# Patient Record
Sex: Female | Born: 1940
Health system: Southern US, Community
[De-identification: ages and names within clinical notes are randomized; demographics above are authoritative.]

## PROBLEM LIST (undated history)

## (undated) DIAGNOSIS — J45909 Unspecified asthma, uncomplicated: Secondary | ICD-10-CM

## (undated) DIAGNOSIS — K631 Perforation of intestine (nontraumatic): Secondary | ICD-10-CM

## (undated) DIAGNOSIS — M5136 Other intervertebral disc degeneration, lumbar region: Secondary | ICD-10-CM

## (undated) DIAGNOSIS — K449 Diaphragmatic hernia without obstruction or gangrene: Secondary | ICD-10-CM

## (undated) DIAGNOSIS — C3490 Malignant neoplasm of unspecified part of unspecified bronchus or lung: Secondary | ICD-10-CM

## (undated) DIAGNOSIS — M7918 Myalgia, other site: Secondary | ICD-10-CM

## (undated) DIAGNOSIS — R7881 Bacteremia: Secondary | ICD-10-CM

## (undated) DIAGNOSIS — E538 Deficiency of other specified B group vitamins: Secondary | ICD-10-CM

## (undated) DIAGNOSIS — IMO0002 Reserved for concepts with insufficient information to code with codable children: Secondary | ICD-10-CM

## (undated) DIAGNOSIS — R0600 Dyspnea, unspecified: Secondary | ICD-10-CM

## (undated) DIAGNOSIS — H3530 Unspecified macular degeneration: Secondary | ICD-10-CM

## (undated) DIAGNOSIS — I639 Cerebral infarction, unspecified: Secondary | ICD-10-CM

## (undated) DIAGNOSIS — M1990 Unspecified osteoarthritis, unspecified site: Secondary | ICD-10-CM

## (undated) DIAGNOSIS — Z9981 Dependence on supplemental oxygen: Secondary | ICD-10-CM

## (undated) DIAGNOSIS — K219 Gastro-esophageal reflux disease without esophagitis: Secondary | ICD-10-CM

## (undated) DIAGNOSIS — F32A Depression, unspecified: Secondary | ICD-10-CM

## (undated) DIAGNOSIS — A419 Sepsis, unspecified organism: Secondary | ICD-10-CM

## (undated) DIAGNOSIS — D649 Anemia, unspecified: Secondary | ICD-10-CM

## (undated) DIAGNOSIS — J189 Pneumonia, unspecified organism: Secondary | ICD-10-CM

## (undated) DIAGNOSIS — R32 Unspecified urinary incontinence: Secondary | ICD-10-CM

## (undated) DIAGNOSIS — R509 Fever, unspecified: Secondary | ICD-10-CM

## (undated) DIAGNOSIS — E785 Hyperlipidemia, unspecified: Secondary | ICD-10-CM

## (undated) DIAGNOSIS — C801 Malignant (primary) neoplasm, unspecified: Secondary | ICD-10-CM

## (undated) DIAGNOSIS — S129XXA Fracture of neck, unspecified, initial encounter: Secondary | ICD-10-CM

## (undated) DIAGNOSIS — M51369 Other intervertebral disc degeneration, lumbar region without mention of lumbar back pain or lower extremity pain: Secondary | ICD-10-CM

## (undated) DIAGNOSIS — I129 Hypertensive chronic kidney disease with stage 1 through stage 4 chronic kidney disease, or unspecified chronic kidney disease: Secondary | ICD-10-CM

## (undated) DIAGNOSIS — I2699 Other pulmonary embolism without acute cor pulmonale: Secondary | ICD-10-CM

## (undated) DIAGNOSIS — M25539 Pain in unspecified wrist: Secondary | ICD-10-CM

## (undated) DIAGNOSIS — F329 Major depressive disorder, single episode, unspecified: Secondary | ICD-10-CM

## (undated) DIAGNOSIS — G8918 Other acute postprocedural pain: Secondary | ICD-10-CM

## (undated) DIAGNOSIS — R079 Chest pain, unspecified: Secondary | ICD-10-CM

## (undated) DIAGNOSIS — J449 Chronic obstructive pulmonary disease, unspecified: Secondary | ICD-10-CM

## (undated) DIAGNOSIS — I10 Essential (primary) hypertension: Secondary | ICD-10-CM

## (undated) DIAGNOSIS — H5440 Blindness, one eye, unspecified eye: Secondary | ICD-10-CM

## (undated) DIAGNOSIS — M810 Age-related osteoporosis without current pathological fracture: Secondary | ICD-10-CM

## (undated) DIAGNOSIS — H269 Unspecified cataract: Secondary | ICD-10-CM

## (undated) HISTORY — PX: JOINT REPLACEMENT: SHX530

## (undated) HISTORY — PX: APPENDECTOMY: SHX54

## (undated) HISTORY — PX: KNEE SURGERY: SHX244

## (undated) HISTORY — PX: OSTOMY: SHX5997

## (undated) HISTORY — DX: Fever, unspecified: R50.9

## (undated) HISTORY — DX: Age-related osteoporosis without current pathological fracture: M81.0

## (undated) HISTORY — PX: CATARACT EXTRACTION W/ INTRAOCULAR LENS  IMPLANT, BILATERAL: SHX1307

## (undated) HISTORY — PX: RECTOCELE REPAIR: SHX761

## (undated) HISTORY — PX: TOE SURGERY: SHX1073

## (undated) HISTORY — PX: CHOLECYSTECTOMY: SHX55

## (undated) HISTORY — DX: Malignant neoplasm of unspecified part of unspecified bronchus or lung: C34.90

## (undated) HISTORY — DX: Other intervertebral disc degeneration, lumbar region without mention of lumbar back pain or lower extremity pain: M51.369

## (undated) HISTORY — DX: Pain in unspecified wrist: M25.539

## (undated) HISTORY — DX: Unspecified osteoarthritis, unspecified site: M19.90

## (undated) HISTORY — DX: Other acute postprocedural pain: G89.18

## (undated) HISTORY — DX: Unspecified cataract: H26.9

## (undated) HISTORY — DX: Reserved for concepts with insufficient information to code with codable children: IMO0002

## (undated) HISTORY — DX: Other intervertebral disc degeneration, lumbar region: M51.36

## (undated) HISTORY — DX: Chest pain, unspecified: R07.9

## (undated) HISTORY — DX: Deficiency of other specified B group vitamins: E53.8

## (undated) HISTORY — DX: Sepsis, unspecified organism: A41.9

## (undated) HISTORY — PX: COLOSTOMY REVERSAL: SHX5782

## (undated) HISTORY — DX: Unspecified urinary incontinence: R32

## (undated) HISTORY — DX: Perforation of intestine (nontraumatic): K63.1

## (undated) HISTORY — PX: ABDOMINAL SURGERY: SHX537

## (undated) HISTORY — DX: Unspecified asthma, uncomplicated: J45.909

## (undated) HISTORY — DX: Myalgia, other site: M79.18

## (undated) HISTORY — PX: BLADDER SURGERY: SHX569

## (undated) HISTORY — DX: Hypertensive chronic kidney disease with stage 1 through stage 4 chronic kidney disease, or unspecified chronic kidney disease: I12.9

## (undated) HISTORY — PX: COLON SURGERY: SHX602

## (undated) HISTORY — DX: Bacteremia: R78.81

## (undated) HISTORY — PX: ABDOMINAL HYSTERECTOMY: SHX81

## (undated) NOTE — *Deleted (*Deleted)
Paclitaxel injection What is this medicine? PACLITAXEL (PAK li TAX el) is a chemotherapy drug. It targets fast dividing cells, like cancer cells, and causes these cells to die. This medicine is used to treat ovarian cancer, breast cancer, lung cancer, Kaposi's sarcoma, and other cancers. This medicine may be used for other purposes; ask your health care Edi Gorniak or pharmacist if you have questions. COMMON BRAND NAME(S): Onxol, Taxol What should I tell my health care Cadyn Rodger before I take this medicine? They need to know if you have any of these conditions:  history of irregular heartbeat  liver disease  low blood counts, like low white cell, platelet, or red cell counts  lung or breathing disease, like asthma  tingling of the fingers or toes, or other nerve disorder  an unusual or allergic reaction to paclitaxel, alcohol, polyoxyethylated castor oil, other chemotherapy, other medicines, foods, dyes, or preservatives  pregnant or trying to get pregnant  breast-feeding How should I use this medicine? This drug is given as an infusion into a vein. It is administered in a hospital or clinic by a specially trained health care professional. Talk to your pediatrician regarding the use of this medicine in children. Special care may be needed. Overdosage: If you think you have taken too much of this medicine contact a poison control center or emergency room at once. NOTE: This medicine is only for you. Do not share this medicine with others. What if I miss a dose? It is important not to miss your dose. Call your doctor or health care professional if you are unable to keep an appointment. What may interact with this medicine? Do not take this medicine with any of the following medications:  disulfiram  metronidazole This medicine may also interact with the following medications:  antiviral medicines for hepatitis, HIV or AIDS  certain antibiotics like erythromycin and  clarithromycin  certain medicines for fungal infections like ketoconazole and itraconazole  certain medicines for seizures like carbamazepine, phenobarbital, phenytoin  gemfibrozil  nefazodone  rifampin  St. John's wort This list may not describe all possible interactions. Give your health care Zsofia Prout a list of all the medicines, herbs, non-prescription drugs, or dietary supplements you use. Also tell them if you smoke, drink alcohol, or use illegal drugs. Some items may interact with your medicine. What should I watch for while using this medicine? Your condition will be monitored carefully while you are receiving this medicine. You will need important blood work done while you are taking this medicine. This medicine can cause serious allergic reactions. To reduce your risk you will need to take other medicine(s) before treatment with this medicine. If you experience allergic reactions like skin rash, itching or hives, swelling of the face, lips, or tongue, tell your doctor or health care professional right away. In some cases, you may be given additional medicines to help with side effects. Follow all directions for their use. This drug may make you feel generally unwell. This is not uncommon, as chemotherapy can affect healthy cells as well as cancer cells. Report any side effects. Continue your course of treatment even though you feel ill unless your doctor tells you to stop. Call your doctor or health care professional for advice if you get a fever, chills or sore throat, or other symptoms of a cold or flu. Do not treat yourself. This drug decreases your body's ability to fight infections. Try to avoid being around people who are sick. This medicine may increase your risk to bruise   or bleed. Call your doctor or health care professional if you notice any unusual bleeding. Be careful brushing and flossing your teeth or using a toothpick because you may get an infection or bleed more easily.  If you have any dental work done, tell your dentist you are receiving this medicine. Avoid taking products that contain aspirin, acetaminophen, ibuprofen, naproxen, or ketoprofen unless instructed by your doctor. These medicines may hide a fever. Do not become pregnant while taking this medicine. Women should inform their doctor if they wish to become pregnant or think they might be pregnant. There is a potential for serious side effects to an unborn child. Talk to your health care professional or pharmacist for more information. Do not breast-feed an infant while taking this medicine. Men are advised not to father a child while receiving this medicine. This product may contain alcohol. Ask your pharmacist or healthcare Deja Pisarski if this medicine contains alcohol. Be sure to tell all healthcare providers you are taking this medicine. Certain medicines, like metronidazole and disulfiram, can cause an unpleasant reaction when taken with alcohol. The reaction includes flushing, headache, nausea, vomiting, sweating, and increased thirst. The reaction can last from 30 minutes to several hours. What side effects may I notice from receiving this medicine? Side effects that you should report to your doctor or health care professional as soon as possible:  allergic reactions like skin rash, itching or hives, swelling of the face, lips, or tongue  breathing problems  changes in vision  fast, irregular heartbeat  high or low blood pressure  mouth sores  pain, tingling, numbness in the hands or feet  signs of decreased platelets or bleeding - bruising, pinpoint red spots on the skin, black, tarry stools, blood in the urine  signs of decreased red blood cells - unusually weak or tired, feeling faint or lightheaded, falls  signs of infection - fever or chills, cough, sore throat, pain or difficulty passing urine  signs and symptoms of liver injury like dark yellow or brown urine; general ill feeling or  flu-like symptoms; light-colored stools; loss of appetite; nausea; right upper belly pain; unusually weak or tired; yellowing of the eyes or skin  swelling of the ankles, feet, hands  unusually slow heartbeat Side effects that usually do not require medical attention (report to your doctor or health care professional if they continue or are bothersome):  diarrhea  hair loss  loss of appetite  muscle or joint pain  nausea, vomiting  pain, redness, or irritation at site where injected  tiredness This list may not describe all possible side effects. Call your doctor for medical advice about side effects. You may report side effects to FDA at 1-800-FDA-1088. Where should I keep my medicine? This drug is given in a hospital or clinic and will not be stored at home. NOTE: This sheet is a summary. It may not cover all possible information. If you have questions about this medicine, talk to your doctor, pharmacist, or health care Jayson Waterhouse.  2020 Elsevier/Gold Standard (2017-01-14 13:14:55) Carboplatin injection What is this medicine? CARBOPLATIN (KAR boe pla tin) is a chemotherapy drug. It targets fast dividing cells, like cancer cells, and causes these cells to die. This medicine is used to treat ovarian cancer and many other cancers. This medicine may be used for other purposes; ask your health care Tighe Gitto or pharmacist if you have questions. COMMON BRAND NAME(S): Paraplatin What should I tell my health care Stellah Donovan before I take this medicine? They need to   know if you have any of these conditions:  blood disorders  hearing problems  kidney disease  recent or ongoing radiation therapy  an unusual or allergic reaction to carboplatin, cisplatin, other chemotherapy, other medicines, foods, dyes, or preservatives  pregnant or trying to get pregnant  breast-feeding How should I use this medicine? This drug is usually given as an infusion into a vein. It is administered in a  hospital or clinic by a specially trained health care professional. Talk to your pediatrician regarding the use of this medicine in children. Special care may be needed. Overdosage: If you think you have taken too much of this medicine contact a poison control center or emergency room at once. NOTE: This medicine is only for you. Do not share this medicine with others. What if I miss a dose? It is important not to miss a dose. Call your doctor or health care professional if you are unable to keep an appointment. What may interact with this medicine?  medicines for seizures  medicines to increase blood counts like filgrastim, pegfilgrastim, sargramostim  some antibiotics like amikacin, gentamicin, neomycin, streptomycin, tobramycin  vaccines Talk to your doctor or health care professional before taking any of these medicines:  acetaminophen  aspirin  ibuprofen  ketoprofen  naproxen This list may not describe all possible interactions. Give your health care Demetris Capell a list of all the medicines, herbs, non-prescription drugs, or dietary supplements you use. Also tell them if you smoke, drink alcohol, or use illegal drugs. Some items may interact with your medicine. What should I watch for while using this medicine? Your condition will be monitored carefully while you are receiving this medicine. You will need important blood work done while you are taking this medicine. This drug may make you feel generally unwell. This is not uncommon, as chemotherapy can affect healthy cells as well as cancer cells. Report any side effects. Continue your course of treatment even though you feel ill unless your doctor tells you to stop. In some cases, you may be given additional medicines to help with side effects. Follow all directions for their use. Call your doctor or health care professional for advice if you get a fever, chills or sore throat, or other symptoms of a cold or flu. Do not treat  yourself. This drug decreases your body's ability to fight infections. Try to avoid being around people who are sick. This medicine may increase your risk to bruise or bleed. Call your doctor or health care professional if you notice any unusual bleeding. Be careful brushing and flossing your teeth or using a toothpick because you may get an infection or bleed more easily. If you have any dental work done, tell your dentist you are receiving this medicine. Avoid taking products that contain aspirin, acetaminophen, ibuprofen, naproxen, or ketoprofen unless instructed by your doctor. These medicines may hide a fever. Do not become pregnant while taking this medicine. Women should inform their doctor if they wish to become pregnant or think they might be pregnant. There is a potential for serious side effects to an unborn child. Talk to your health care professional or pharmacist for more information. Do not breast-feed an infant while taking this medicine. What side effects may I notice from receiving this medicine? Side effects that you should report to your doctor or health care professional as soon as possible:  allergic reactions like skin rash, itching or hives, swelling of the face, lips, or tongue  signs of infection - fever or   chills, cough, sore throat, pain or difficulty passing urine  signs of decreased platelets or bleeding - bruising, pinpoint red spots on the skin, black, tarry stools, nosebleeds  signs of decreased red blood cells - unusually weak or tired, fainting spells, lightheadedness  breathing problems  changes in hearing  changes in vision  chest pain  high blood pressure  low blood counts - This drug may decrease the number of white blood cells, red blood cells and platelets. You may be at increased risk for infections and bleeding.  nausea and vomiting  pain, swelling, redness or irritation at the injection site  pain, tingling, numbness in the hands or  feet  problems with balance, talking, walking  trouble passing urine or change in the amount of urine Side effects that usually do not require medical attention (report to your doctor or health care professional if they continue or are bothersome):  hair loss  loss of appetite  metallic taste in the mouth or changes in taste This list may not describe all possible side effects. Call your doctor for medical advice about side effects. You may report side effects to FDA at 1-800-FDA-1088. Where should I keep my medicine? This drug is given in a hospital or clinic and will not be stored at home. NOTE: This sheet is a summary. It may not cover all possible information. If you have questions about this medicine, talk to your doctor, pharmacist, or health care Toy Samarin.  2020 Elsevier/Gold Standard (2007-08-18 14:38:05)  

---

## 2000-02-14 ENCOUNTER — Encounter: Payer: Self-pay | Admitting: Orthopedic Surgery

## 2000-02-14 ENCOUNTER — Encounter: Admission: RE | Admit: 2000-02-14 | Discharge: 2000-02-14 | Payer: Self-pay | Admitting: Orthopedic Surgery

## 2000-04-15 ENCOUNTER — Encounter: Payer: Self-pay | Admitting: Orthopedic Surgery

## 2000-04-21 ENCOUNTER — Inpatient Hospital Stay (HOSPITAL_COMMUNITY): Admission: RE | Admit: 2000-04-21 | Discharge: 2000-04-24 | Payer: Self-pay | Admitting: Orthopedic Surgery

## 2003-05-12 ENCOUNTER — Other Ambulatory Visit: Admission: RE | Admit: 2003-05-12 | Discharge: 2003-05-12 | Payer: Self-pay | Admitting: Internal Medicine

## 2003-06-15 ENCOUNTER — Encounter: Admission: RE | Admit: 2003-06-15 | Discharge: 2003-06-15 | Payer: Self-pay | Admitting: Internal Medicine

## 2003-07-22 ENCOUNTER — Other Ambulatory Visit: Payer: Self-pay

## 2003-07-24 ENCOUNTER — Emergency Department (HOSPITAL_COMMUNITY): Admission: EM | Admit: 2003-07-24 | Discharge: 2003-07-24 | Payer: Self-pay | Admitting: Emergency Medicine

## 2003-07-27 ENCOUNTER — Ambulatory Visit (HOSPITAL_COMMUNITY): Admission: RE | Admit: 2003-07-27 | Discharge: 2003-07-27 | Payer: Self-pay | Admitting: Gastroenterology

## 2004-12-14 ENCOUNTER — Emergency Department: Payer: Self-pay | Admitting: Emergency Medicine

## 2004-12-14 ENCOUNTER — Other Ambulatory Visit: Payer: Self-pay

## 2005-04-27 IMAGING — CR DG CHEST 2V
2 series · 2 of 2 positions shown · non-contrast
Comparison: none

CLINICAL DATA: Chest pain.
 PA AND LATERAL CHEST
 No comparison chest.  Comparison chest CT 06/15/03.

[view not recorded (1 of 2)]
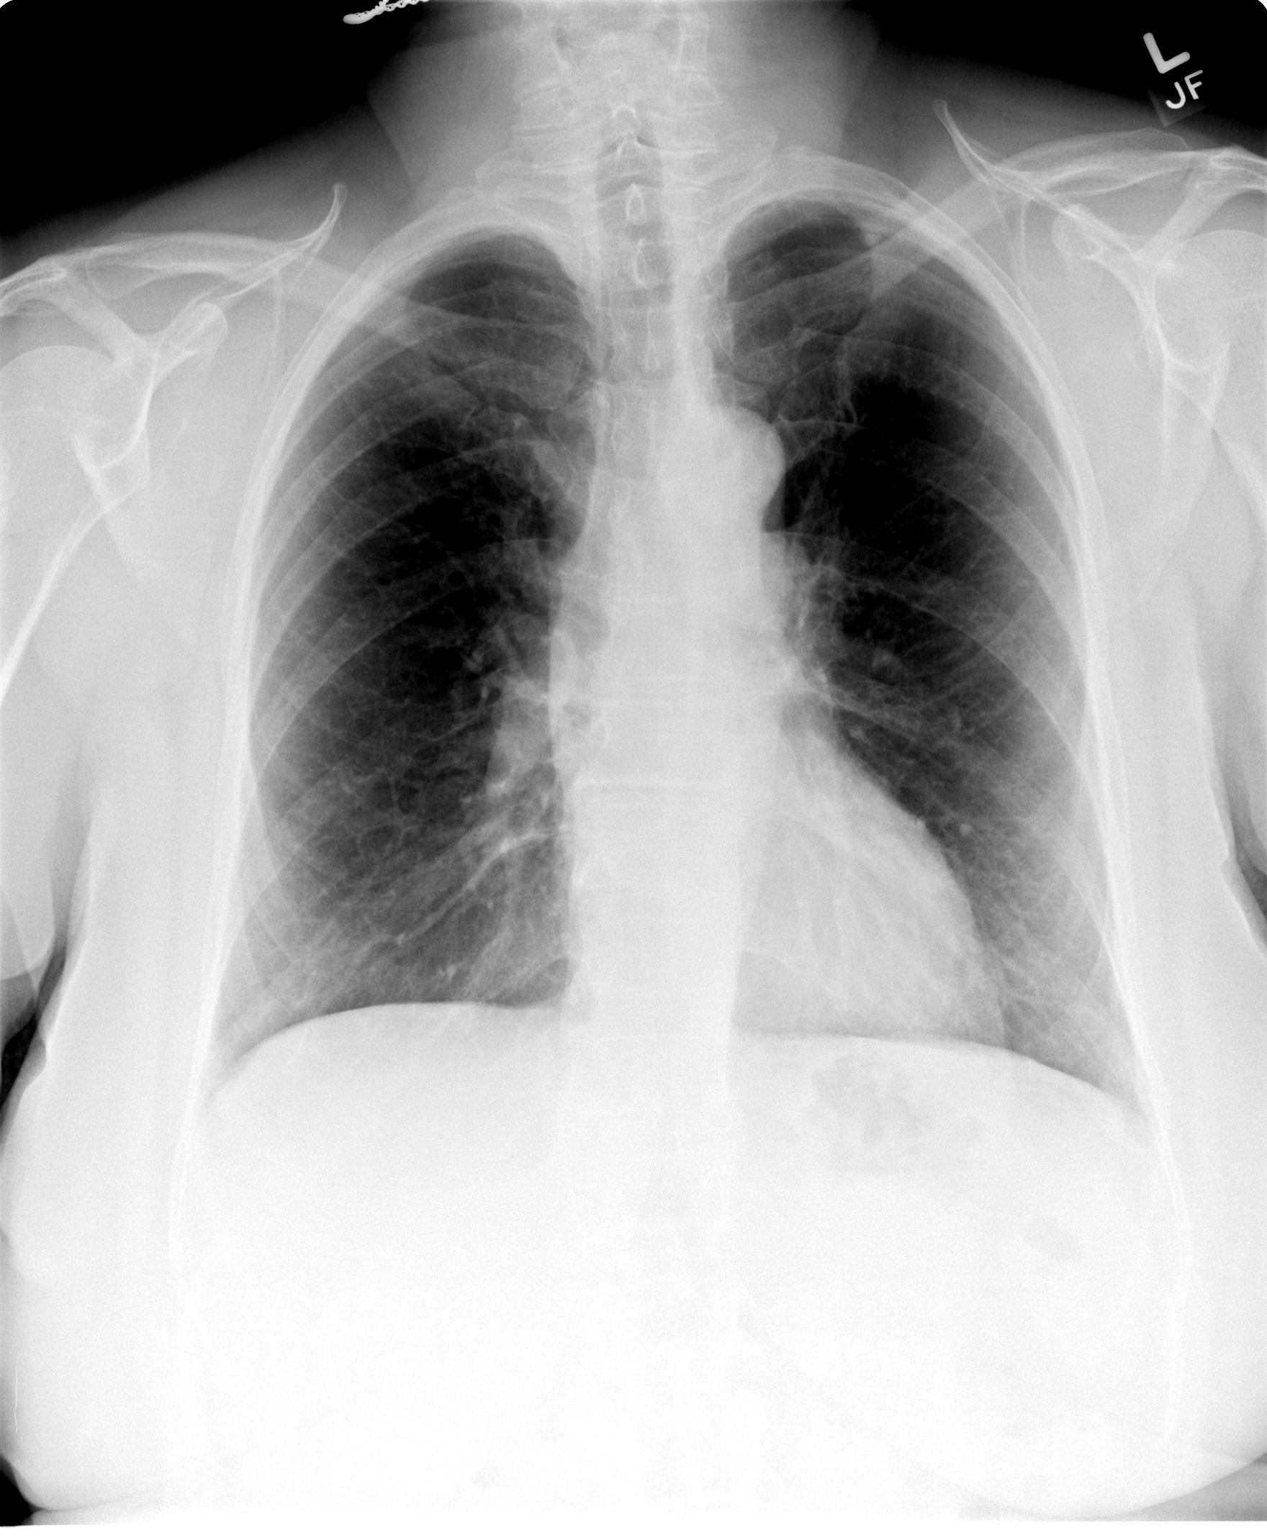

[view not recorded (2 of 2)]
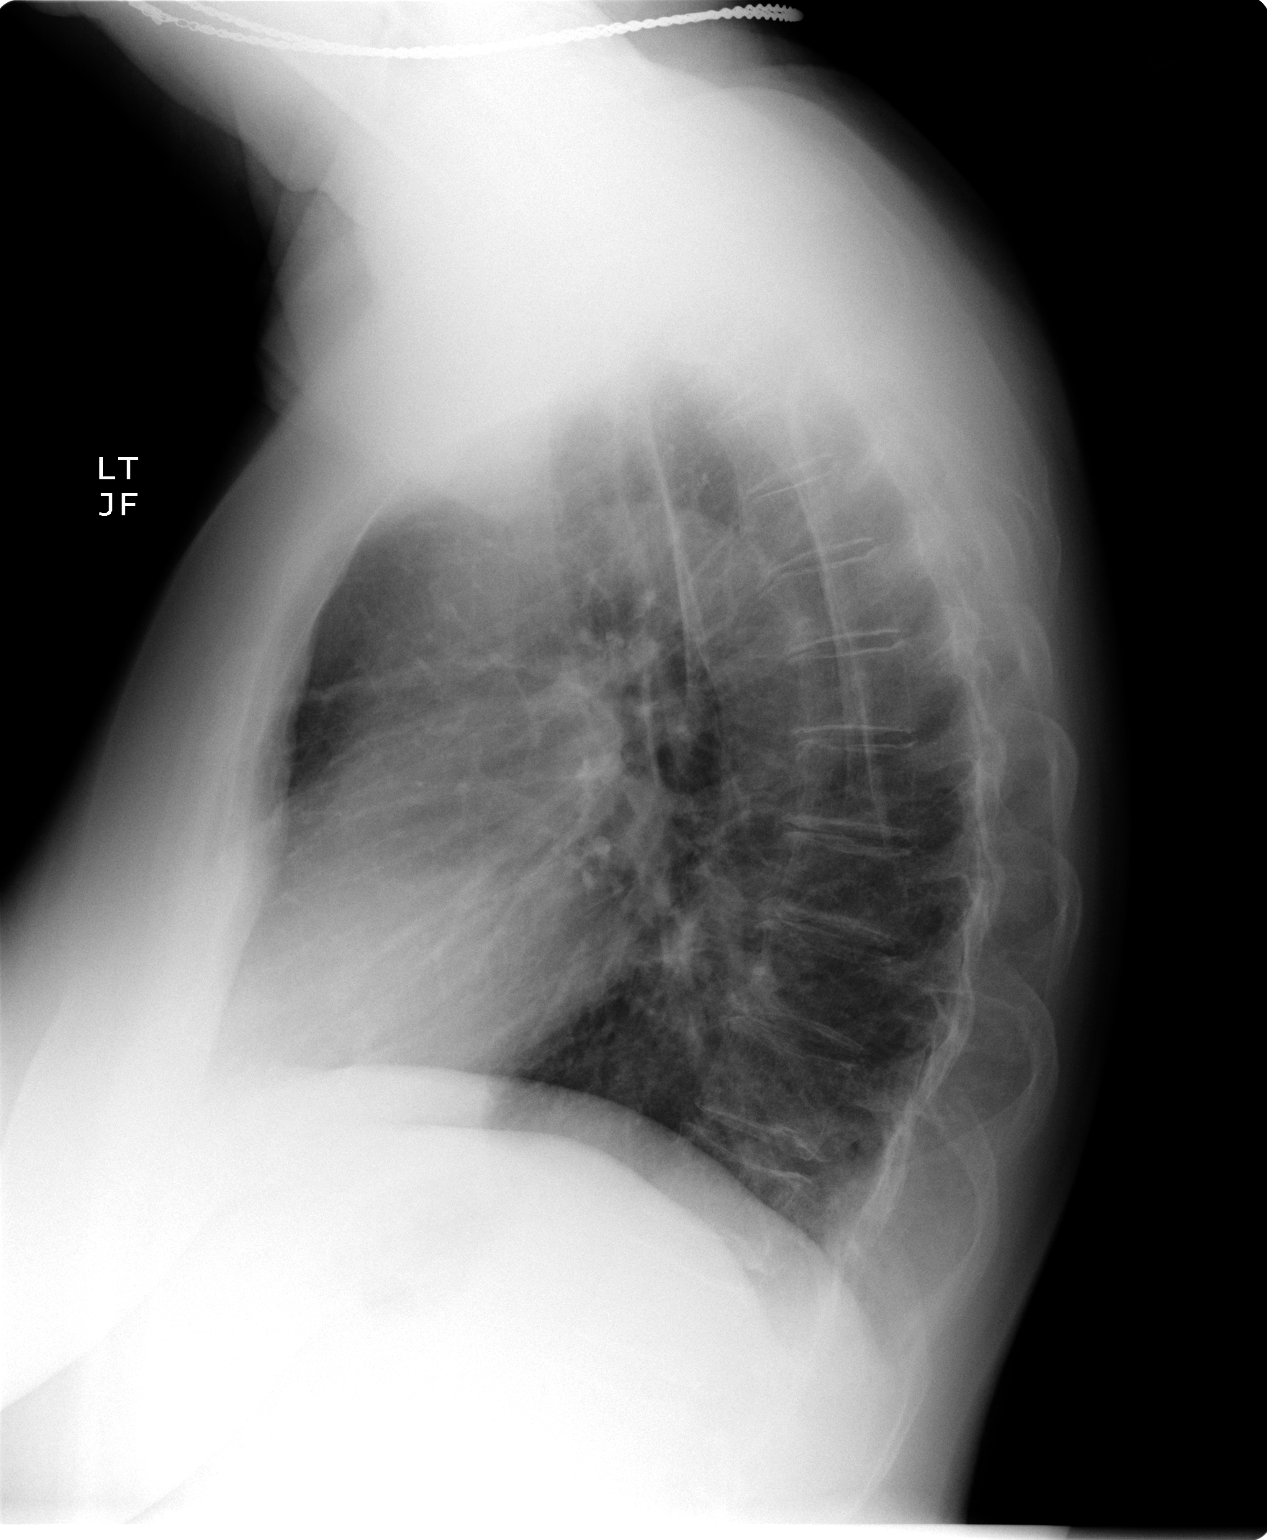

[2 of 2 positions shown; findings below may reference images not displayed]

FINDINGS: Granuloma left lung apex.  No evidence of infiltrate, congestive heart failure, or pneumothorax.  Minimal tortuous aorta.  Heart size within normal limits.
 IMPRESSION 
 No acute abnormality.

## 2005-10-02 ENCOUNTER — Other Ambulatory Visit: Admission: RE | Admit: 2005-10-02 | Discharge: 2005-10-02 | Payer: Self-pay | Admitting: Internal Medicine

## 2006-07-11 ENCOUNTER — Encounter: Admission: RE | Admit: 2006-07-11 | Discharge: 2006-07-11 | Payer: Self-pay | Admitting: Internal Medicine

## 2006-07-16 ENCOUNTER — Inpatient Hospital Stay (HOSPITAL_COMMUNITY): Admission: RE | Admit: 2006-07-16 | Discharge: 2006-07-24 | Payer: Self-pay | Admitting: Urology

## 2006-07-30 ENCOUNTER — Emergency Department (HOSPITAL_COMMUNITY): Admission: EM | Admit: 2006-07-30 | Discharge: 2006-07-30 | Payer: Self-pay | Admitting: Emergency Medicine

## 2006-08-05 ENCOUNTER — Observation Stay (HOSPITAL_COMMUNITY): Admission: EM | Admit: 2006-08-05 | Discharge: 2006-08-06 | Payer: Self-pay | Admitting: Urology

## 2006-09-15 ENCOUNTER — Encounter: Admission: RE | Admit: 2006-09-15 | Discharge: 2006-09-15 | Payer: Self-pay | Admitting: Surgery

## 2006-09-18 IMAGING — CR DG CHEST 1V PORT
1 series · 1 of 1 positions shown · non-contrast
Comparison: none

REASON FOR EXAM: Chest pain
COMMENTS:

PROCEDURE:     DXR - DXR PORTABLE CHEST SINGLE VIEW  - December 14, 2004  [DATE]
RESULT:     The lungs are clear.  The cardiovascular structures are
unremarkable.

[view not recorded]
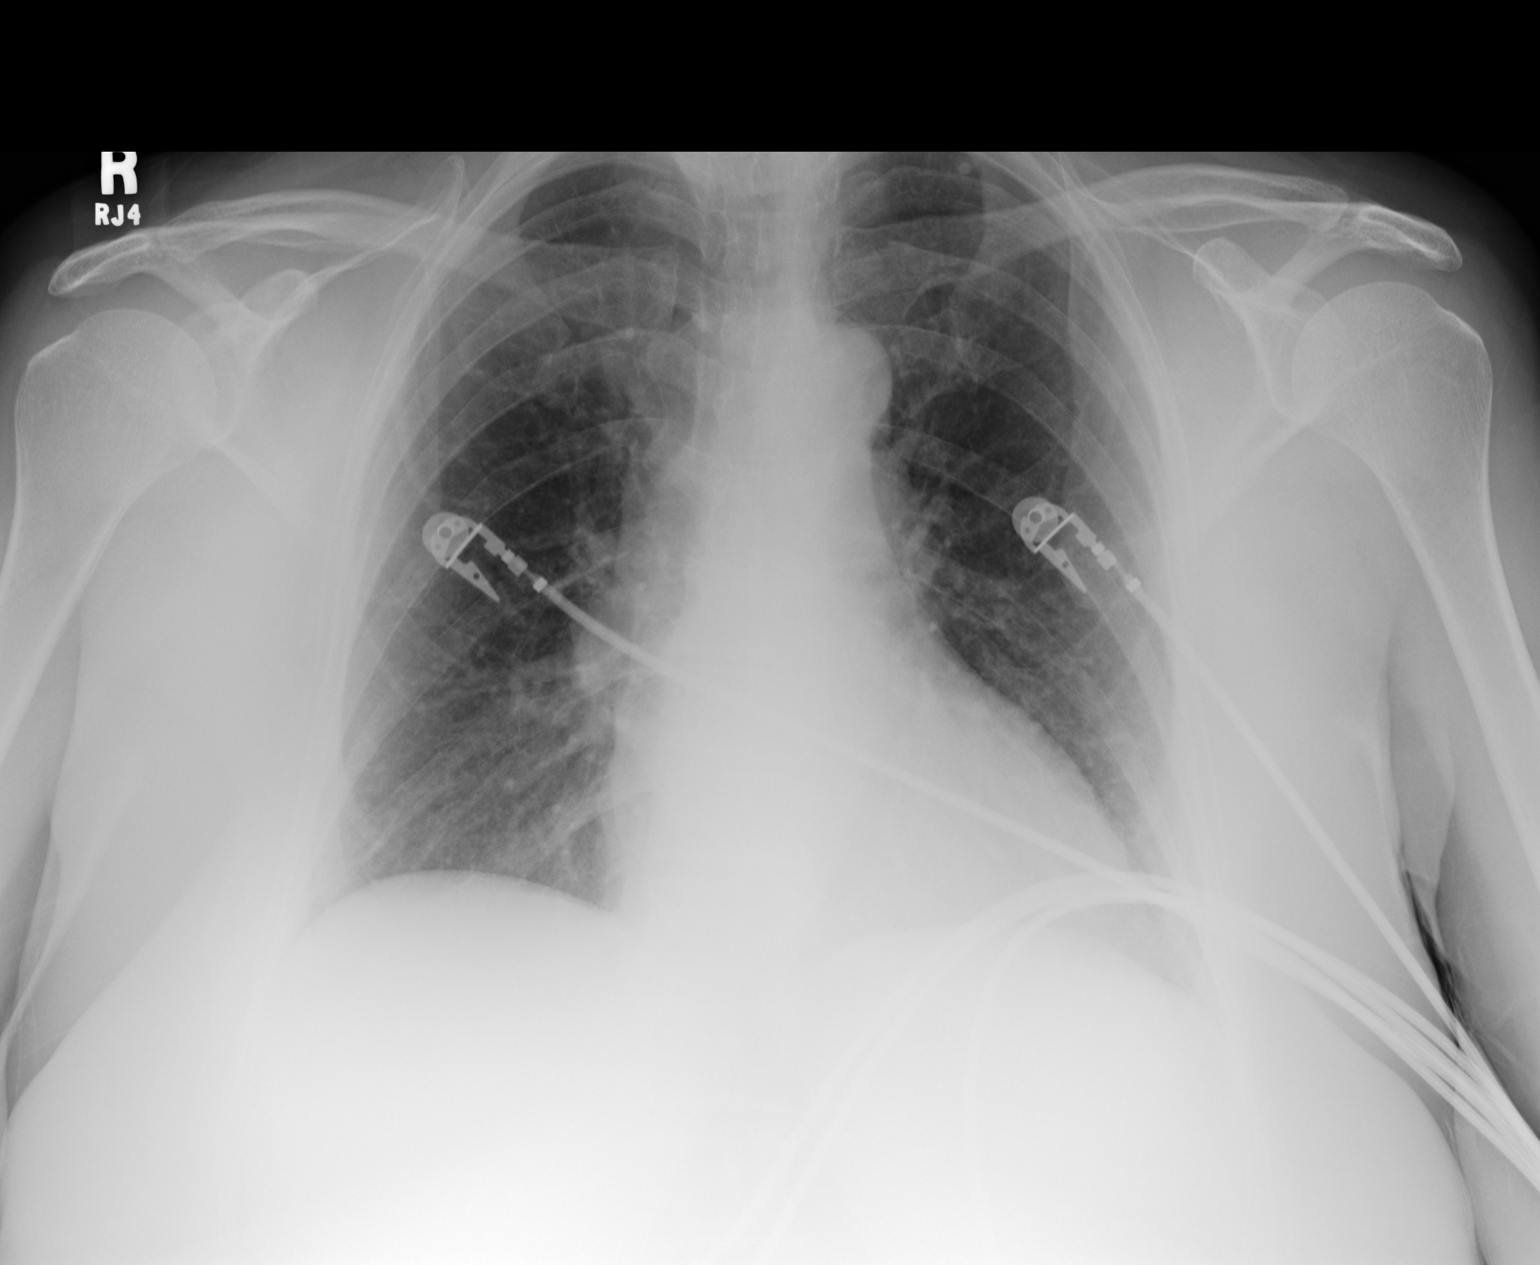

[1 of 1 positions shown; findings below may reference images not displayed]

IMPRESSION: No acute cardiopulmonary disease.

## 2006-10-09 ENCOUNTER — Ambulatory Visit (HOSPITAL_COMMUNITY): Admission: RE | Admit: 2006-10-09 | Discharge: 2006-10-09 | Payer: Self-pay | Admitting: Surgery

## 2006-10-17 ENCOUNTER — Inpatient Hospital Stay (HOSPITAL_COMMUNITY): Admission: RE | Admit: 2006-10-17 | Discharge: 2006-10-22 | Payer: Self-pay | Admitting: Surgery

## 2006-10-17 ENCOUNTER — Encounter (INDEPENDENT_AMBULATORY_CARE_PROVIDER_SITE_OTHER): Payer: Self-pay | Admitting: Surgery

## 2007-01-14 ENCOUNTER — Emergency Department (HOSPITAL_COMMUNITY): Admission: EM | Admit: 2007-01-14 | Discharge: 2007-01-14 | Payer: Self-pay | Admitting: Emergency Medicine

## 2007-01-23 ENCOUNTER — Inpatient Hospital Stay (HOSPITAL_COMMUNITY): Admission: EM | Admit: 2007-01-23 | Discharge: 2007-01-25 | Payer: Self-pay | Admitting: Surgery

## 2007-02-03 ENCOUNTER — Encounter: Admission: RE | Admit: 2007-02-03 | Discharge: 2007-02-03 | Payer: Self-pay | Admitting: Surgery

## 2007-04-27 ENCOUNTER — Inpatient Hospital Stay (HOSPITAL_COMMUNITY): Admission: RE | Admit: 2007-04-27 | Discharge: 2007-04-30 | Payer: Self-pay | Admitting: Orthopedic Surgery

## 2007-04-29 ENCOUNTER — Ambulatory Visit: Payer: Self-pay | Admitting: Surgery

## 2007-04-29 ENCOUNTER — Encounter (INDEPENDENT_AMBULATORY_CARE_PROVIDER_SITE_OTHER): Payer: Self-pay | Admitting: Orthopedic Surgery

## 2007-07-12 ENCOUNTER — Emergency Department: Payer: Self-pay | Admitting: Emergency Medicine

## 2007-10-30 ENCOUNTER — Ambulatory Visit: Admission: RE | Admit: 2007-10-30 | Discharge: 2007-10-30 | Payer: Self-pay | Admitting: Orthopedic Surgery

## 2007-10-30 ENCOUNTER — Ambulatory Visit: Payer: Self-pay | Admitting: Vascular Surgery

## 2007-10-30 ENCOUNTER — Encounter (INDEPENDENT_AMBULATORY_CARE_PROVIDER_SITE_OTHER): Payer: Self-pay | Admitting: Orthopedic Surgery

## 2007-11-14 ENCOUNTER — Emergency Department (HOSPITAL_COMMUNITY): Admission: EM | Admit: 2007-11-14 | Discharge: 2007-11-14 | Payer: Self-pay | Admitting: Emergency Medicine

## 2007-11-17 ENCOUNTER — Emergency Department (HOSPITAL_COMMUNITY): Admission: EM | Admit: 2007-11-17 | Discharge: 2007-11-17 | Payer: Self-pay | Admitting: Emergency Medicine

## 2007-12-02 ENCOUNTER — Ambulatory Visit: Payer: Self-pay | Admitting: Internal Medicine

## 2008-03-31 ENCOUNTER — Emergency Department (HOSPITAL_COMMUNITY): Admission: EM | Admit: 2008-03-31 | Discharge: 2008-03-31 | Payer: Self-pay | Admitting: Emergency Medicine

## 2008-04-06 ENCOUNTER — Encounter (INDEPENDENT_AMBULATORY_CARE_PROVIDER_SITE_OTHER): Payer: Self-pay | Admitting: Surgery

## 2008-04-06 ENCOUNTER — Ambulatory Visit (HOSPITAL_COMMUNITY): Admission: RE | Admit: 2008-04-06 | Discharge: 2008-04-07 | Payer: Self-pay | Admitting: Surgery

## 2008-04-14 IMAGING — US US SOFT TISSUE HEAD/NECK
1 series · 14 of 25 positions shown · non-contrast
Comparison: [REDACTED] chest CT 06/15/03.

CLINICAL DATA: Thyroid enlargement on physical exam.  
THYROID ULTRASOUND:
TECHNIQUE: Ultrasound examination of the thyroid gland and adjacent soft tissue structures was performed.

[Series 1: unknown · 0.08mm/px · 14 of 33 slices shown]
[im 1/33]
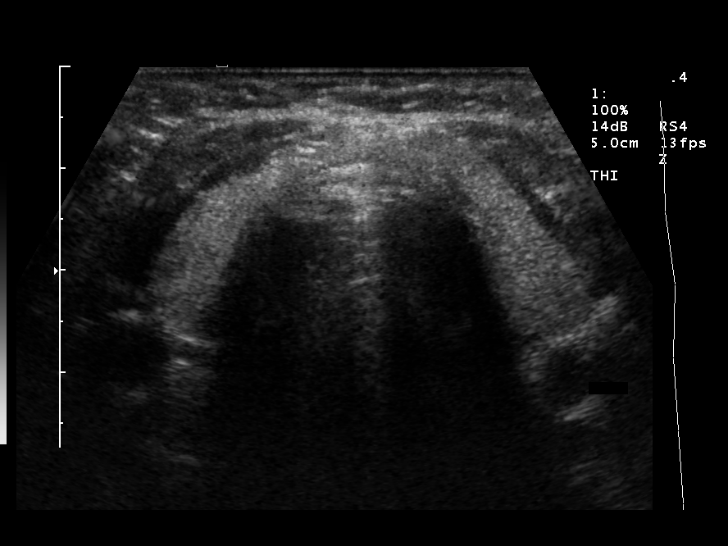
[im 3/33]
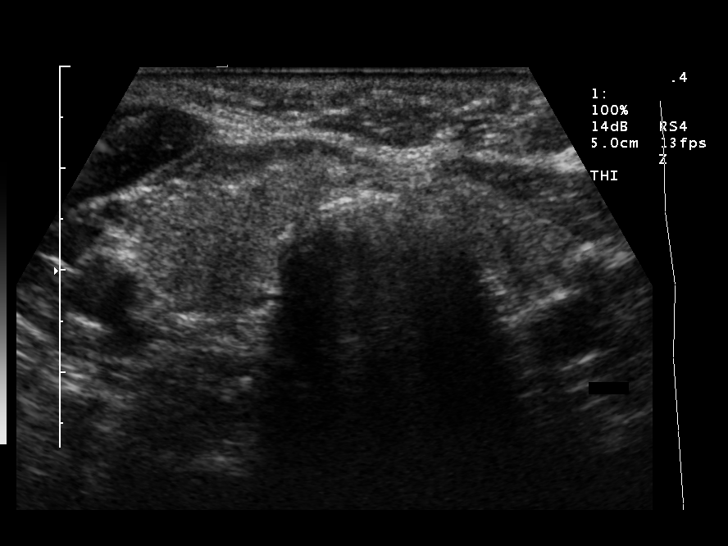
[im 6/33]
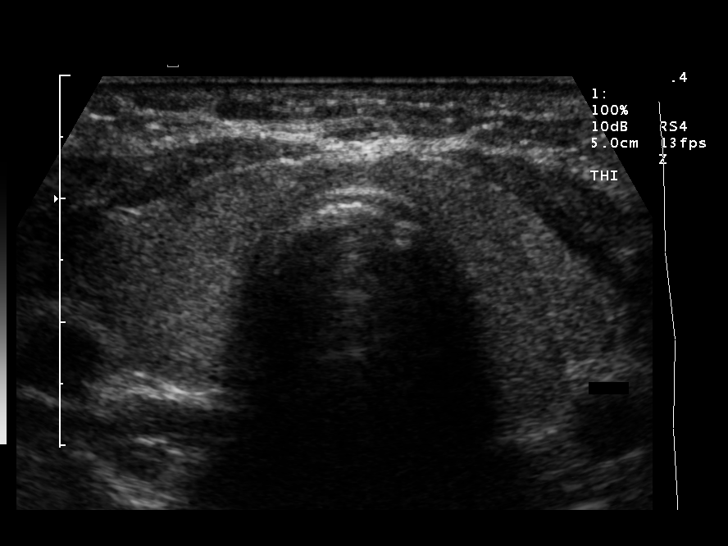
[im 9/33]
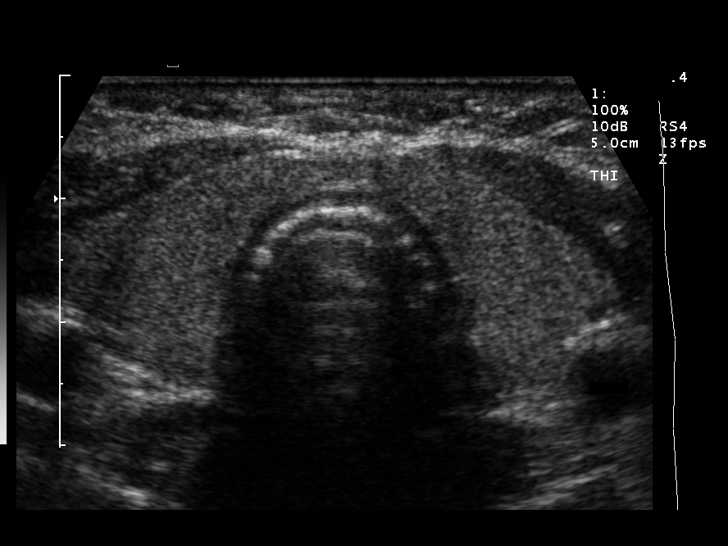
[im 11/33]
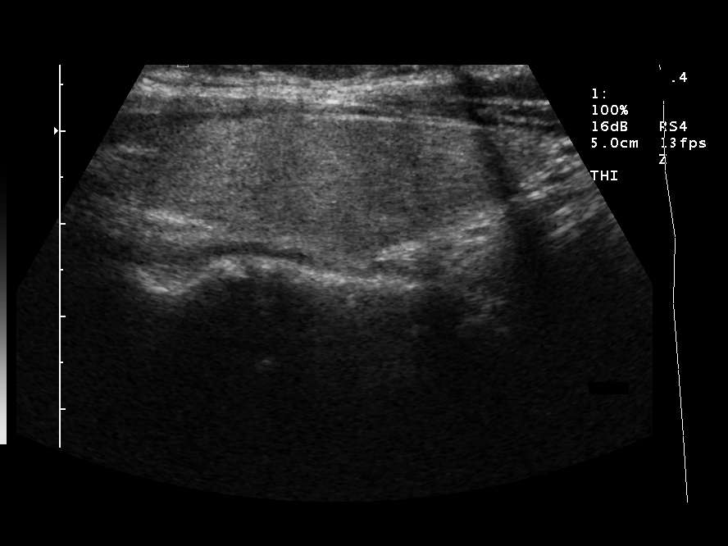
[im 13/33]
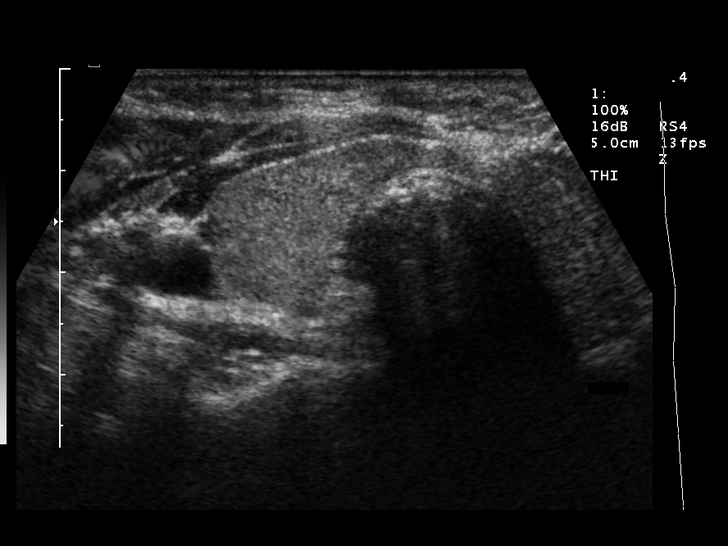
[im 15/33]
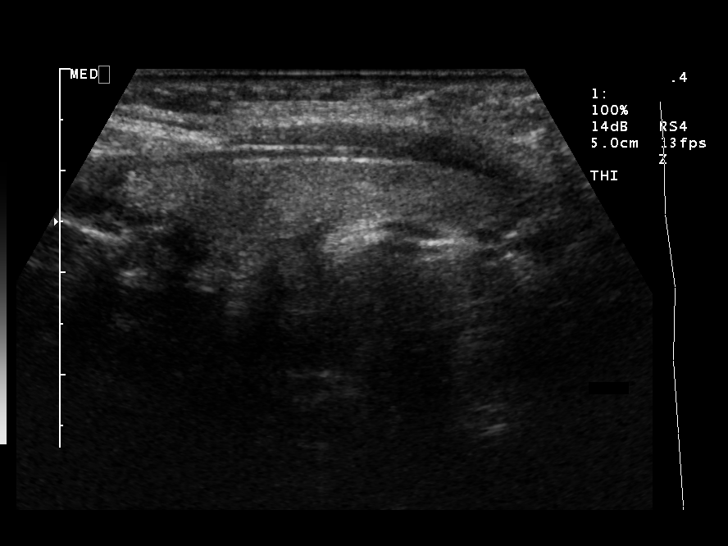
[im 18/33]
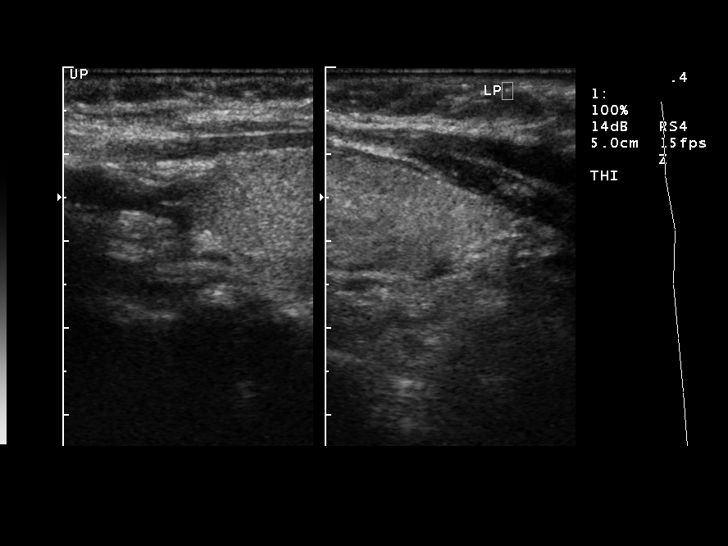
[im 21/33]
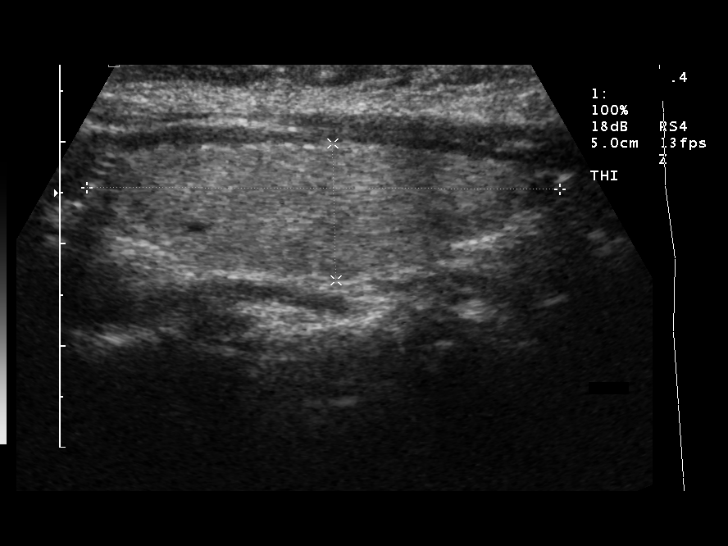
[im 22/33]
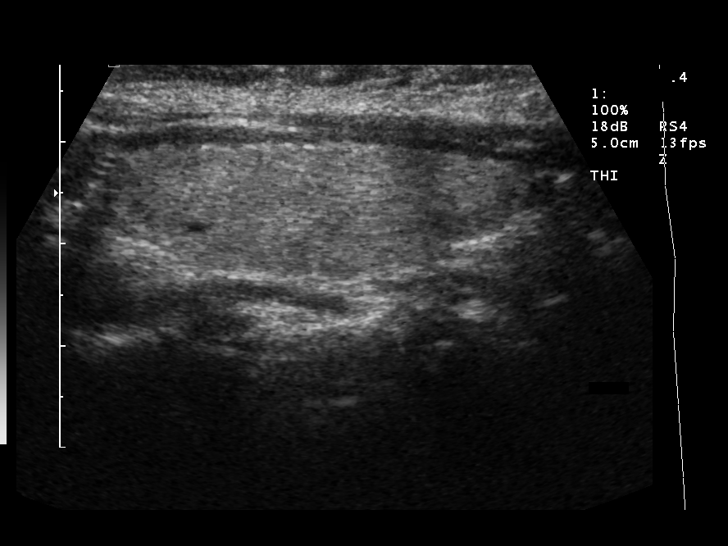
[im 25/33]
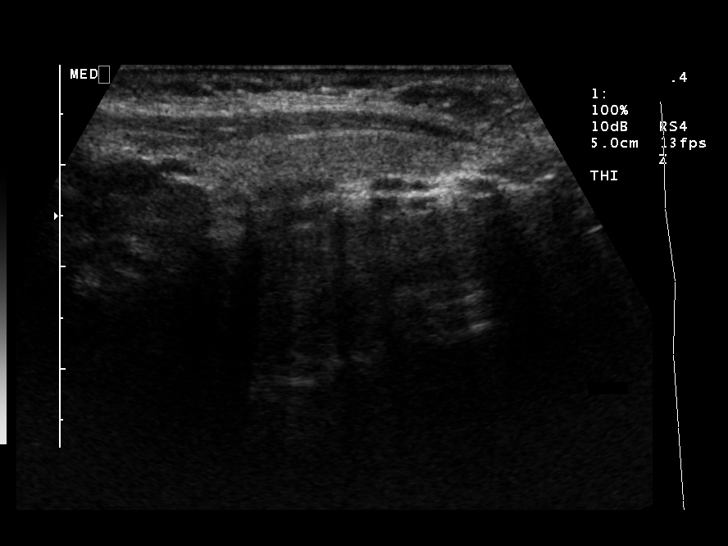
[im 27/33]
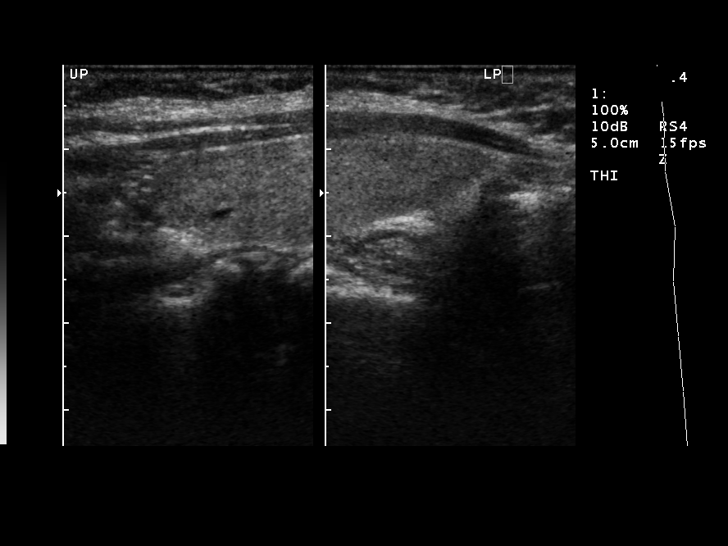
[im 30/33]
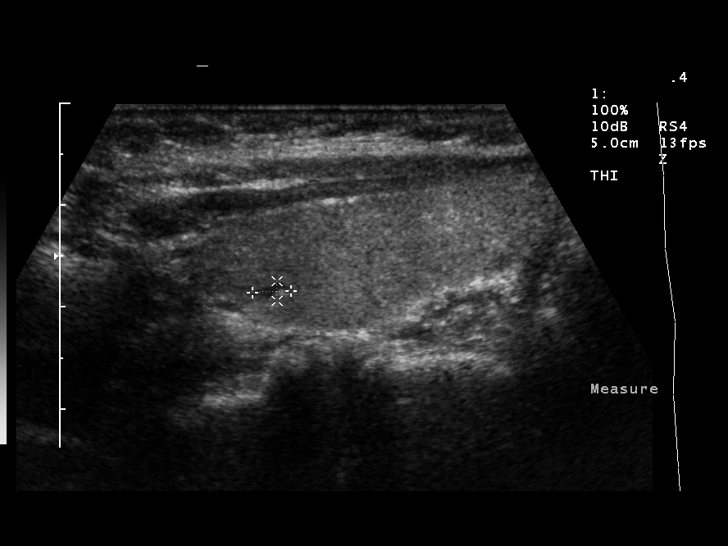
[im 33/33]
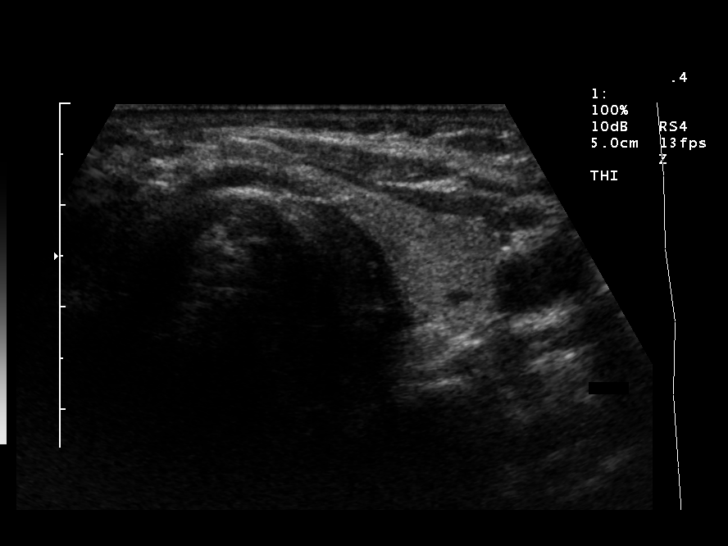

[14 of 25 positions shown; findings below may reference images not displayed]

FINDINGS: Thyroid gland is upper limits of normal in size with the right lobe measuring 5.2 cm long x 1.6 cm AP x 1.5 cm wide.  Left lobe measures 4.6 cm long x 1.3 cm AP x 1.6 cm wide.  The isthmus measures 4 mm AP.  Thyroid echotexture is homogeneous.  At the upper pole left lobe is solitary small slightly complex cystic focus measuring 4 mm long x 2 mm AP x 2 mm wide most consistent with cystic adenoma.
IMPRESSION: 1.  Thyroid gland upper limits of normal in size.
2.  Solitary slightly complex cyst measuring up to 4 mm upper pole left favoring incidental benign cystic adenoma. 
3.  Otherwise negative.

## 2008-04-14 IMAGING — CT CT PARANASAL SINUSES LIMITED
1 series · 16 of 22 positions shown, 20 images · non-contrast
Comparison: none

CLINICAL DATA: Chronic sinusitis.  Congestion.  Headaches.  
 LIMITED CT OF PARANASAL SINUSES:
TECHNIQUE: Limited coronal CT images were obtained through the paranasal sinuses without intravenous contrast.

[Series 2: limited sinus · axial · 0.33mm/px · z∈[-3,+92]mm · 16 of 22 slices shown, 20 images]
[im 2/22  brain]
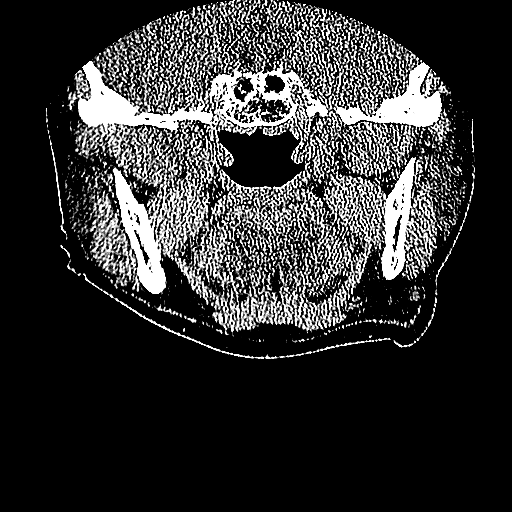
[im 2/22  bone]
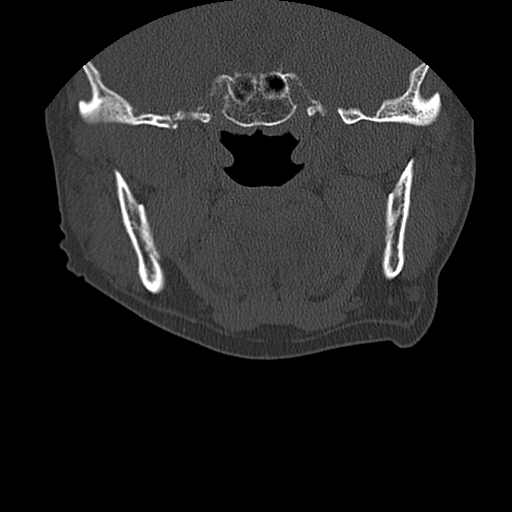
[im 3/22  bone]
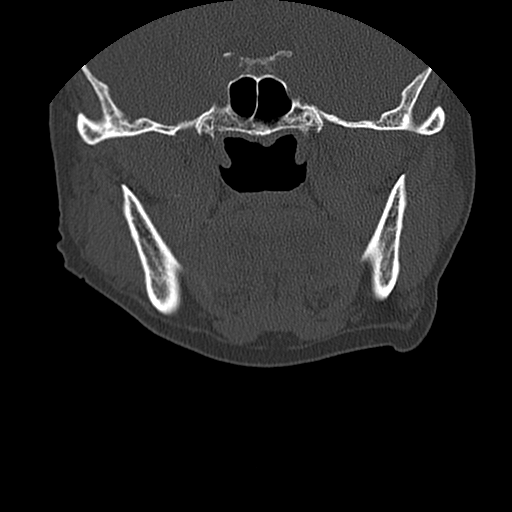
[im 5/22  bone]
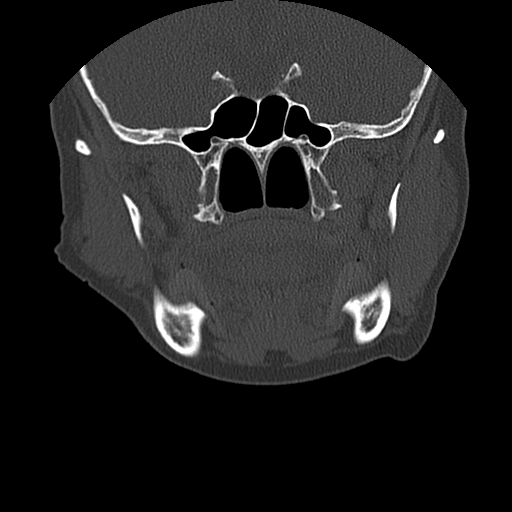
[im 6/22  bone]
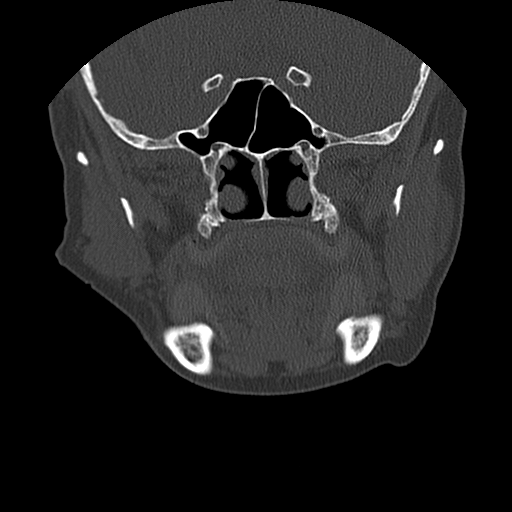
[im 7/22  brain]
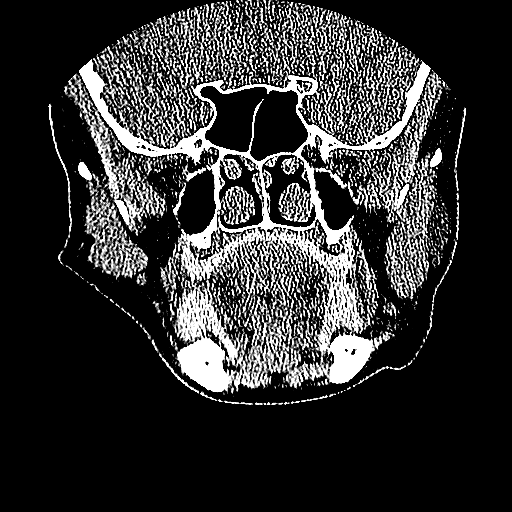
[im 7/22  bone]
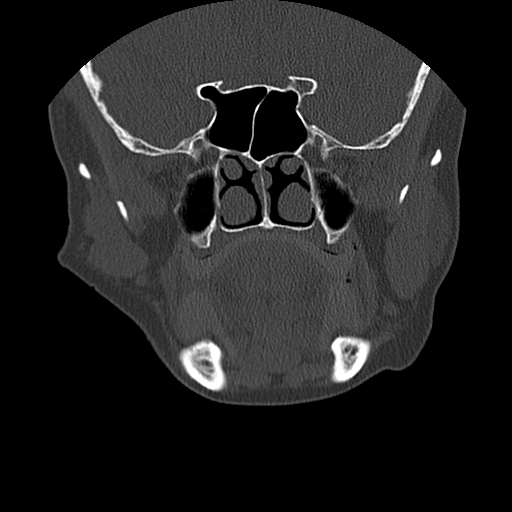
[im 8/22  bone]
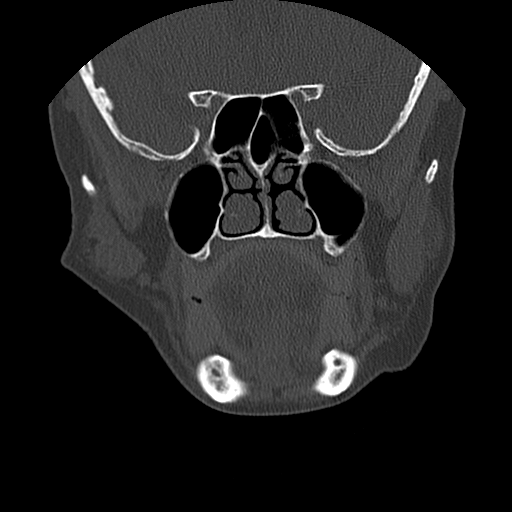
[im 10/22  bone]
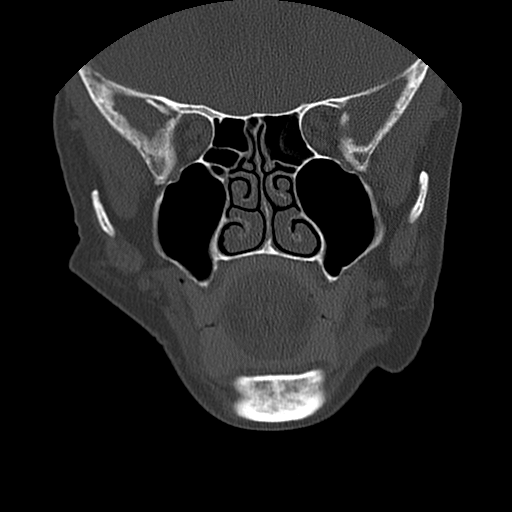
[im 11/22  bone]
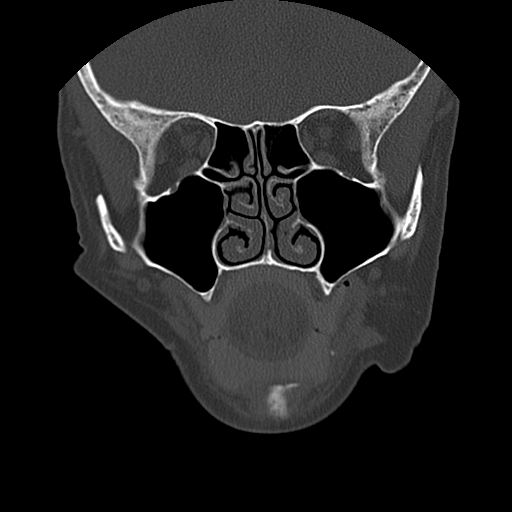
[im 12/22  brain]
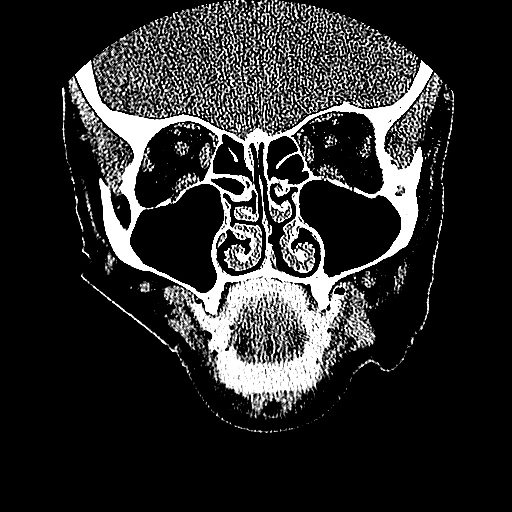
[im 12/22  bone]
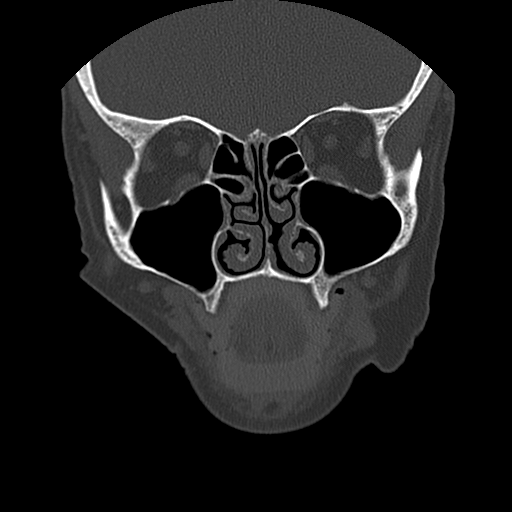
[im 13/22  bone]
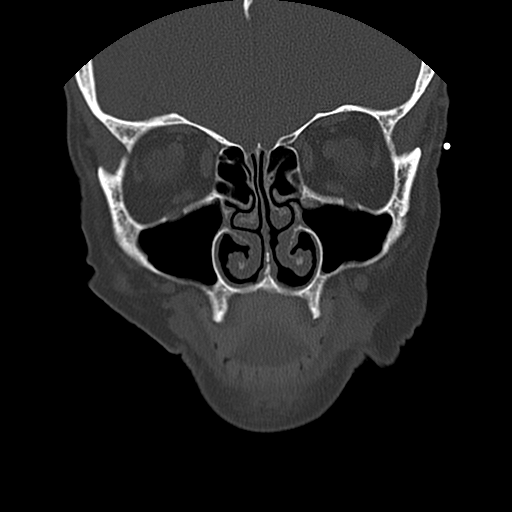
[im 15/22  bone]
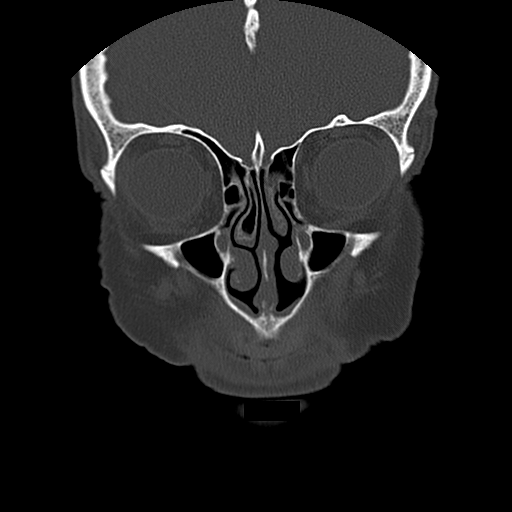
[im 16/22  bone]
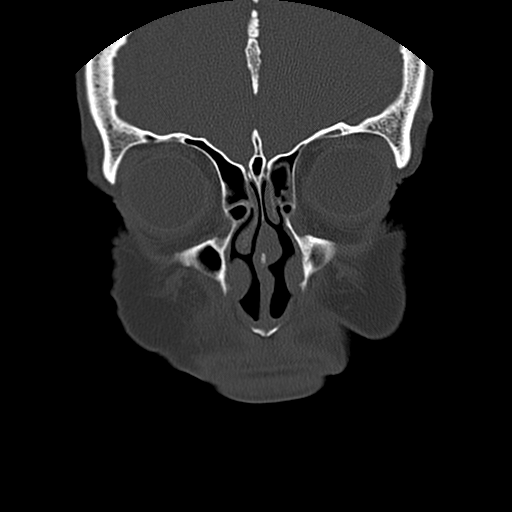
[im 17/22  brain]
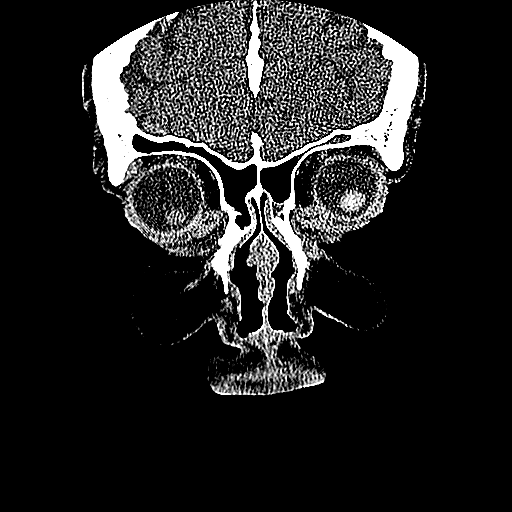
[im 17/22  bone]
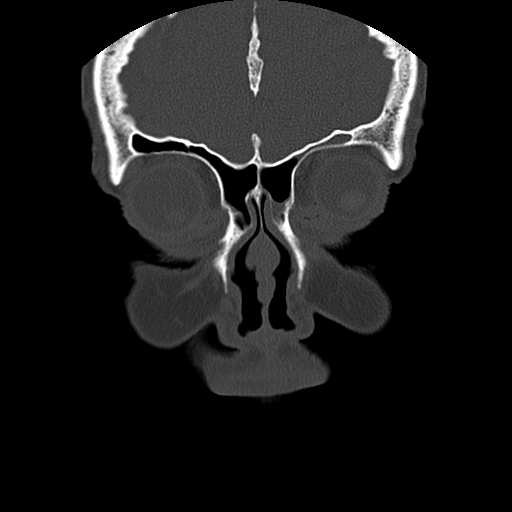
[im 18/22  bone]
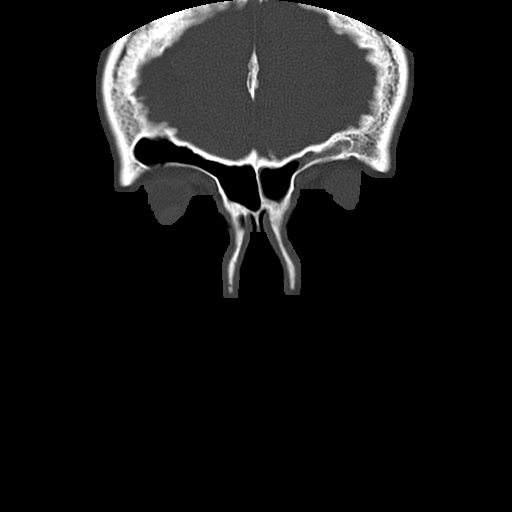
[im 20/22  bone]
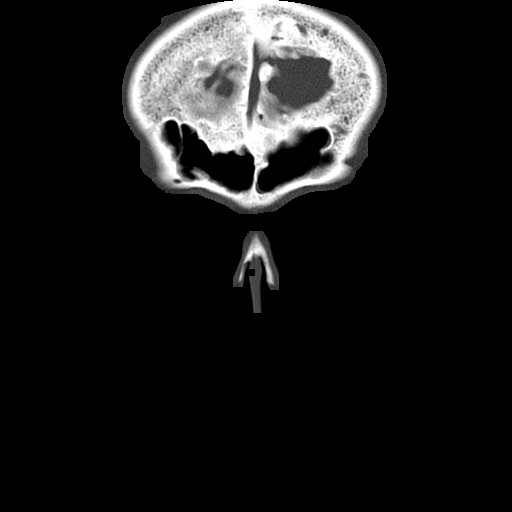
[im 21/22  bone]
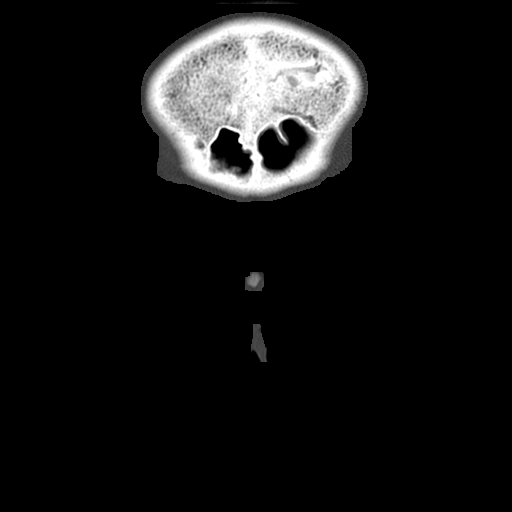

[16 of 22 positions shown; findings below may reference images not displayed]

FINDINGS: The frontal sinuses show very mild mucosal thickening on the right.  No fluid.  A few of the ethmoid air cells show some minor mucosal thickening.  The maxillary and sphenoid sinuses are clear.  Nasal septum is midline.
IMPRESSION: Minor mucosal inflammatory changes in the right division of the frontal sinus and a few of the ethmoid air cells.

## 2008-04-21 IMAGING — CT CT ABDOMEN W/ CM
1 of 4 series · 13 of 32 positions shown, 18 images · IV contrast (omnipaque)
Comparison: none

CLINICAL DATA: 65-year-old, postop repair of rectocele and vault prolapse with abdominal pain and free intraperitoneal air.  
ABDOMEN CT WITH CONTRAST:
TECHNIQUE: Multidetector CT imaging of the abdomen was performed following the standard protocol during bolus administration of intravenous contrast.
Contrast:  125 cc Omnipaque 300 and rectal contrast.
TECHNIQUE: Multidetector CT imaging of the pelvis was performed following the standard protocol during bolus administration of intravenous contrast.

[Series 2: abd_pel 5.0 b40f st · axial · 0.77mm/px · z∈[-594,-210]mm · 13 of 89 slices shown, 18 images]
[im 6/89  soft-tissue]
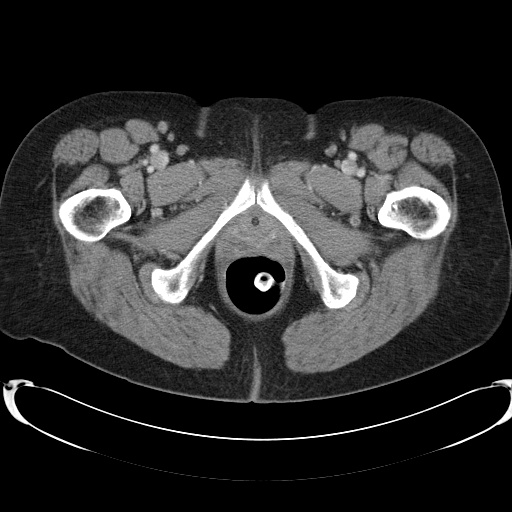
[im 6/89  bone]
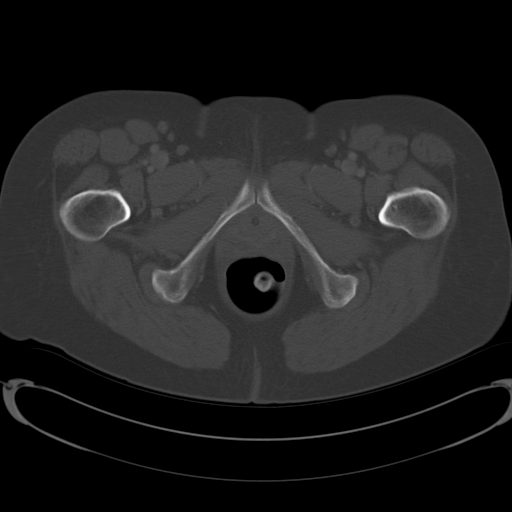
[im 16/89  soft-tissue]
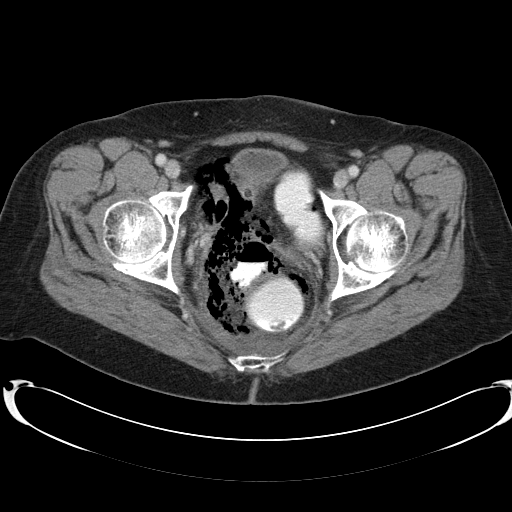
[im 21/89  soft-tissue]
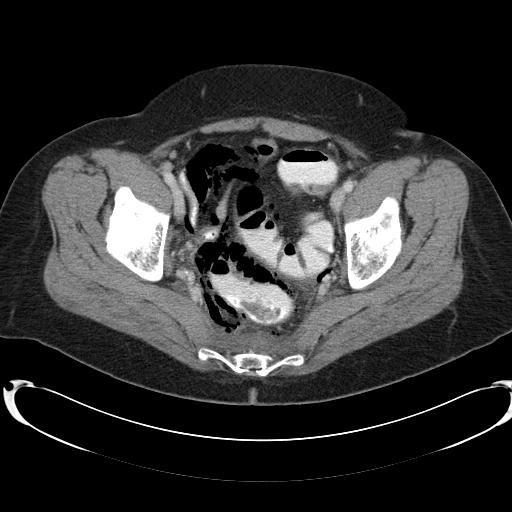
[im 26/89  soft-tissue]
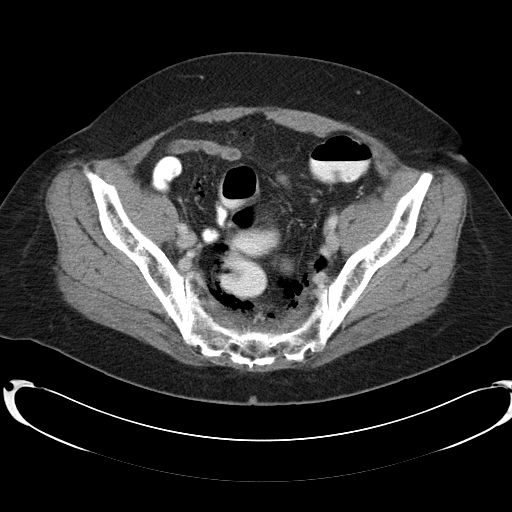
[im 37/89  soft-tissue]
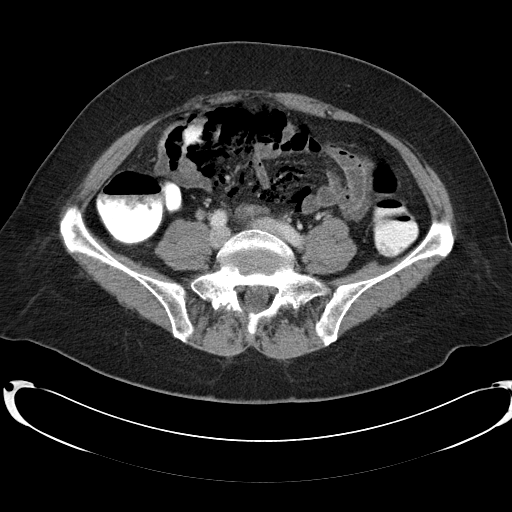
[im 42/89  soft-tissue]
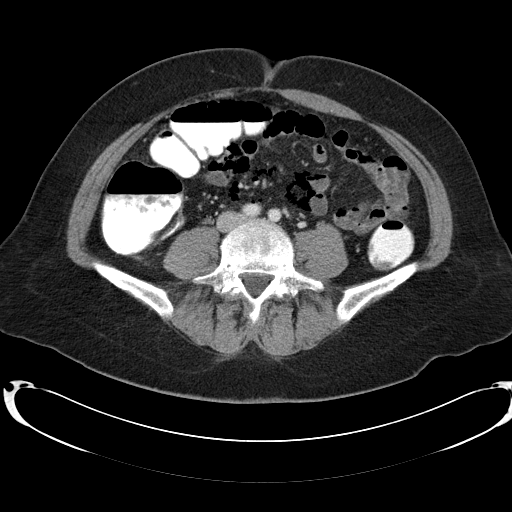
[im 47/89  soft-tissue]
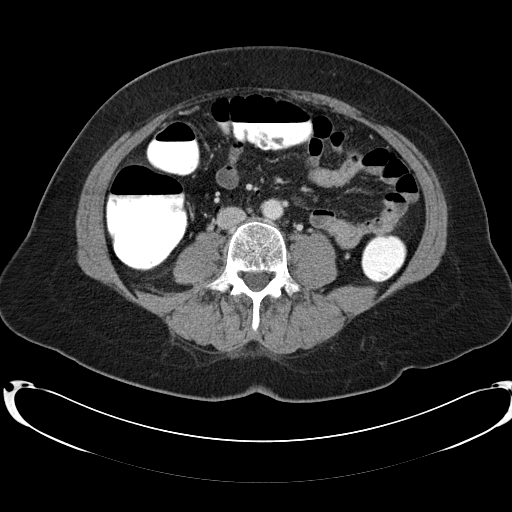
[im 57/89  soft-tissue]
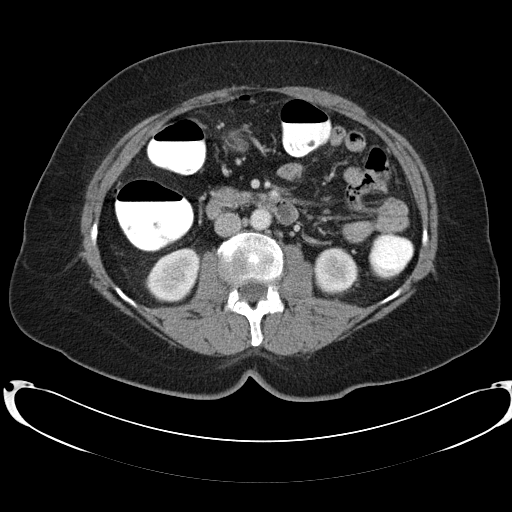
[im 63/89  soft-tissue]
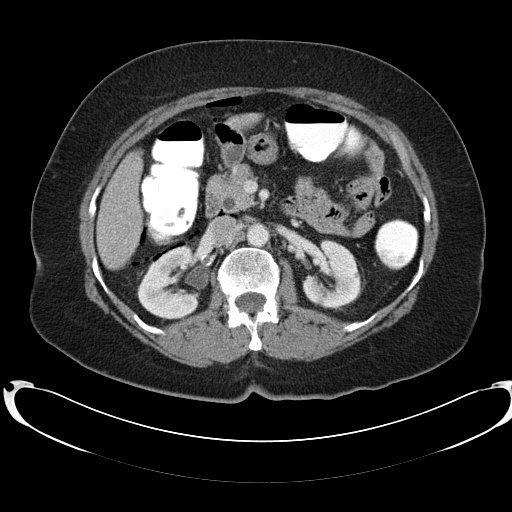
[im 63/89  bone]
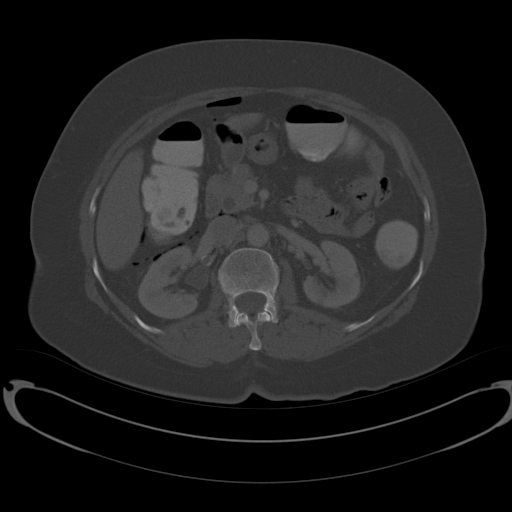
[im 68/89  soft-tissue]
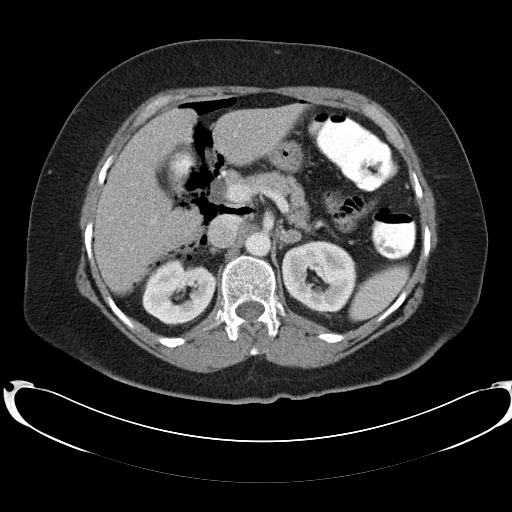
[im 68/89  lung]
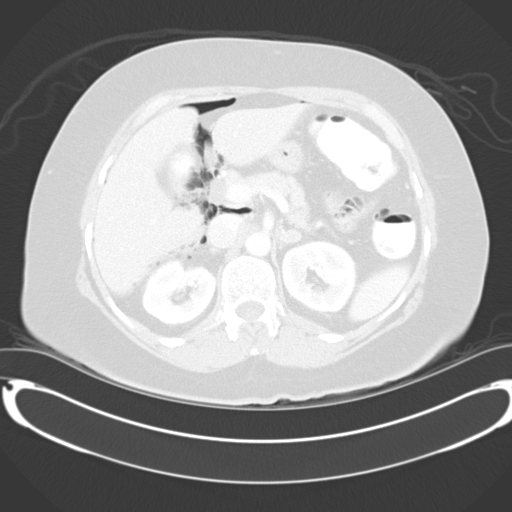
[im 73/89  lung]
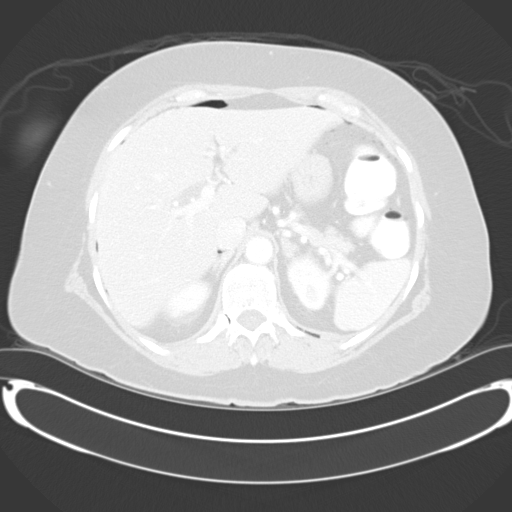
[im 78/89  soft-tissue]
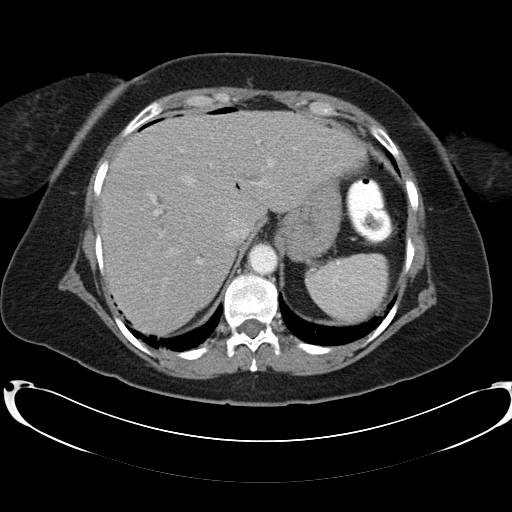
[im 78/89  lung]
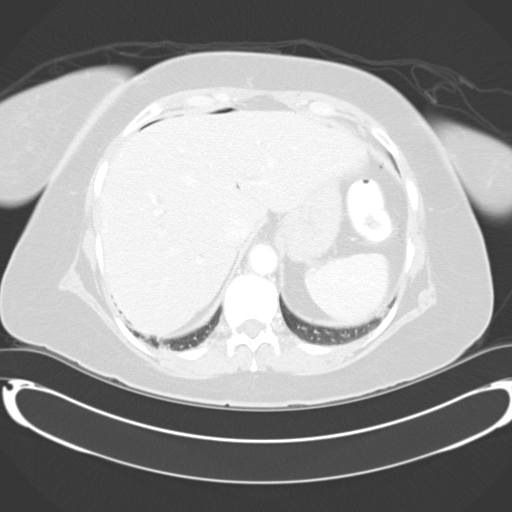
[im 83/89  soft-tissue]
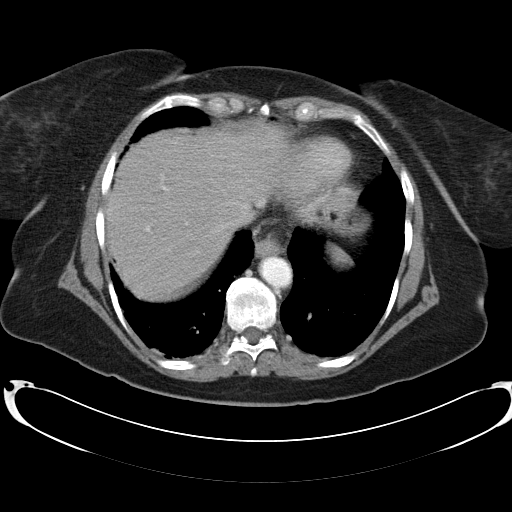
[im 83/89  lung]
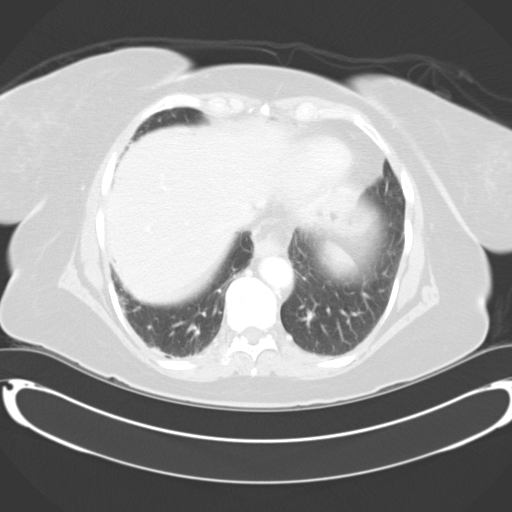

[13 of 32 positions shown; findings below may reference images not displayed]

FINDINGS: The lung bases are clear except for minimal dependent atelectasis.
Small hiatal hernia is noted.  There is a moderate amount of free intraperitoneal air tracking up around the liver.  Mild intrahepatic biliary dilatation is a stable finding compared to previous study of 6559.  There is also common bile duct dilatation.  Findings are likely secondary to patient having previous cholecystectomy. The common bile duct measures 14 mm, but it measured that on the previous study.  Also, there is no evidence for pancreatic mass or obstruction.  Spleen is normal in size.  There are 2 left adrenal gland nodules.  These are stable since the prior CT scan and are likely benign adenomas.  Aorta is normal in caliber.  Kidneys are unremarkable.  There is a simple appearing left renal cyst.  The stomach, duodenum, small bowel and colon demonstrate no significant findings.
IMPRESSION: 1.  Moderate amount of free intraperitoneal air.
2.  Biliary dilatation likely due to previous cholecystectomy. 
3.  No abdominal masses or adenopathy. 
4.  Stable small adrenal gland lesions on the left are likely benign adenomas.  There are benign appearing splenic calcifications and a left renal cyst. 
PELVIS CT WITH CONTRAST:
FINDINGS: There is contrast leaking from the rectum.  There is a focal collection of contrast and air located anteriorly and is approximately 8 cm from the anorectal junction and located just above the pubic symphysis.  There is a large amount of retroperitoneal air tracking in the peritoneal cavity.  There is also fluid and edema in the perirectal space.  There is a Foley catheter in the bladder which is otherwise normal.  No pelvic masses.
IMPRESSION: Leaking contrast from the rectum withe a focal collection of contrast approximately 8 cm above the anorectal junction.  The leak is anterior and there is a large amount of free air also.  There must be a defect in the peritoneum as there is air coursing into the peritoneal cavity.  Findings were called directly to Dr. Willair.

## 2008-04-26 IMAGING — CR DG CHEST 2V
2 series · 2 of 2 positions shown · non-contrast
Comparison: 07/17/06.

CLINICAL DATA: Shortness of breath. Postop bladder repair. Wheezing.

[w chest pa]
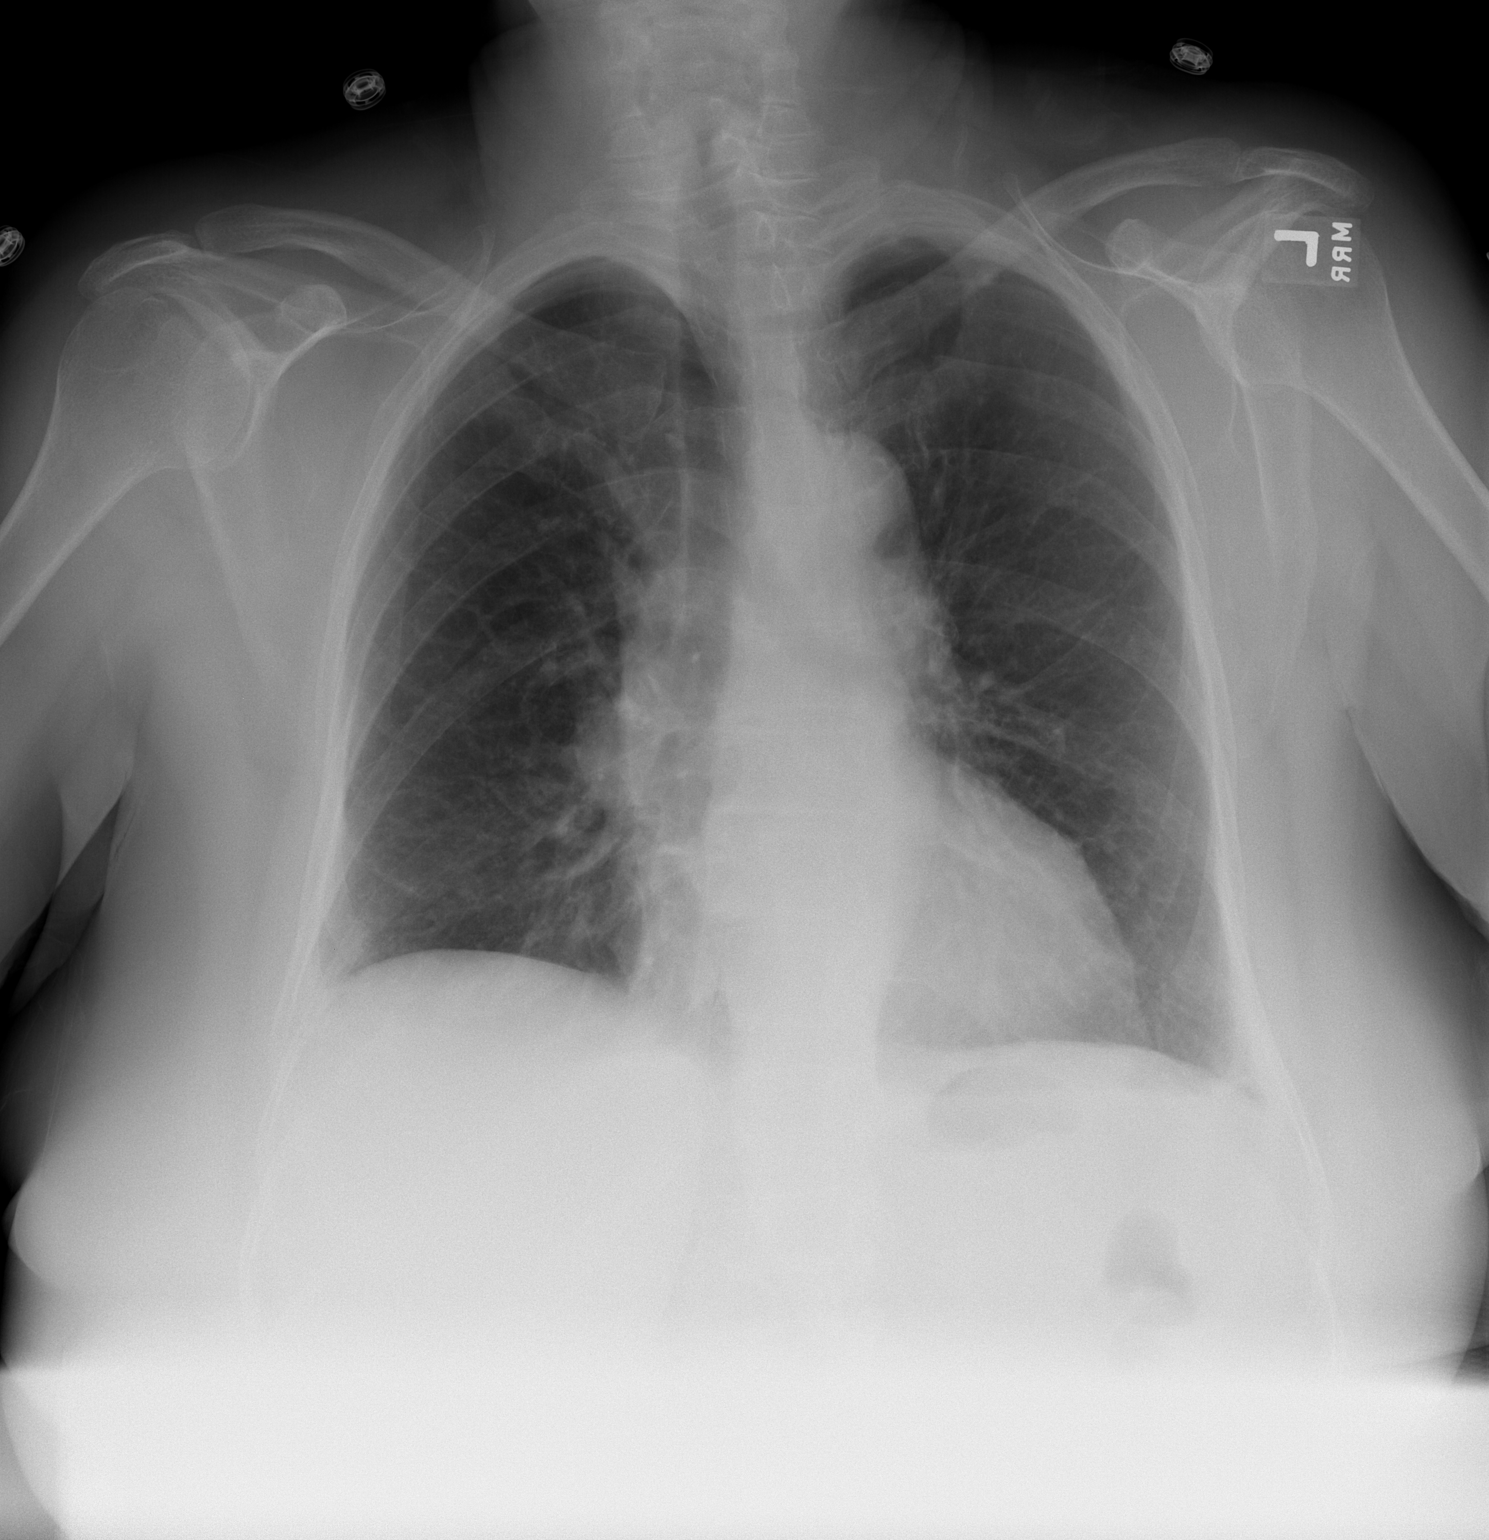

[w chest lat]
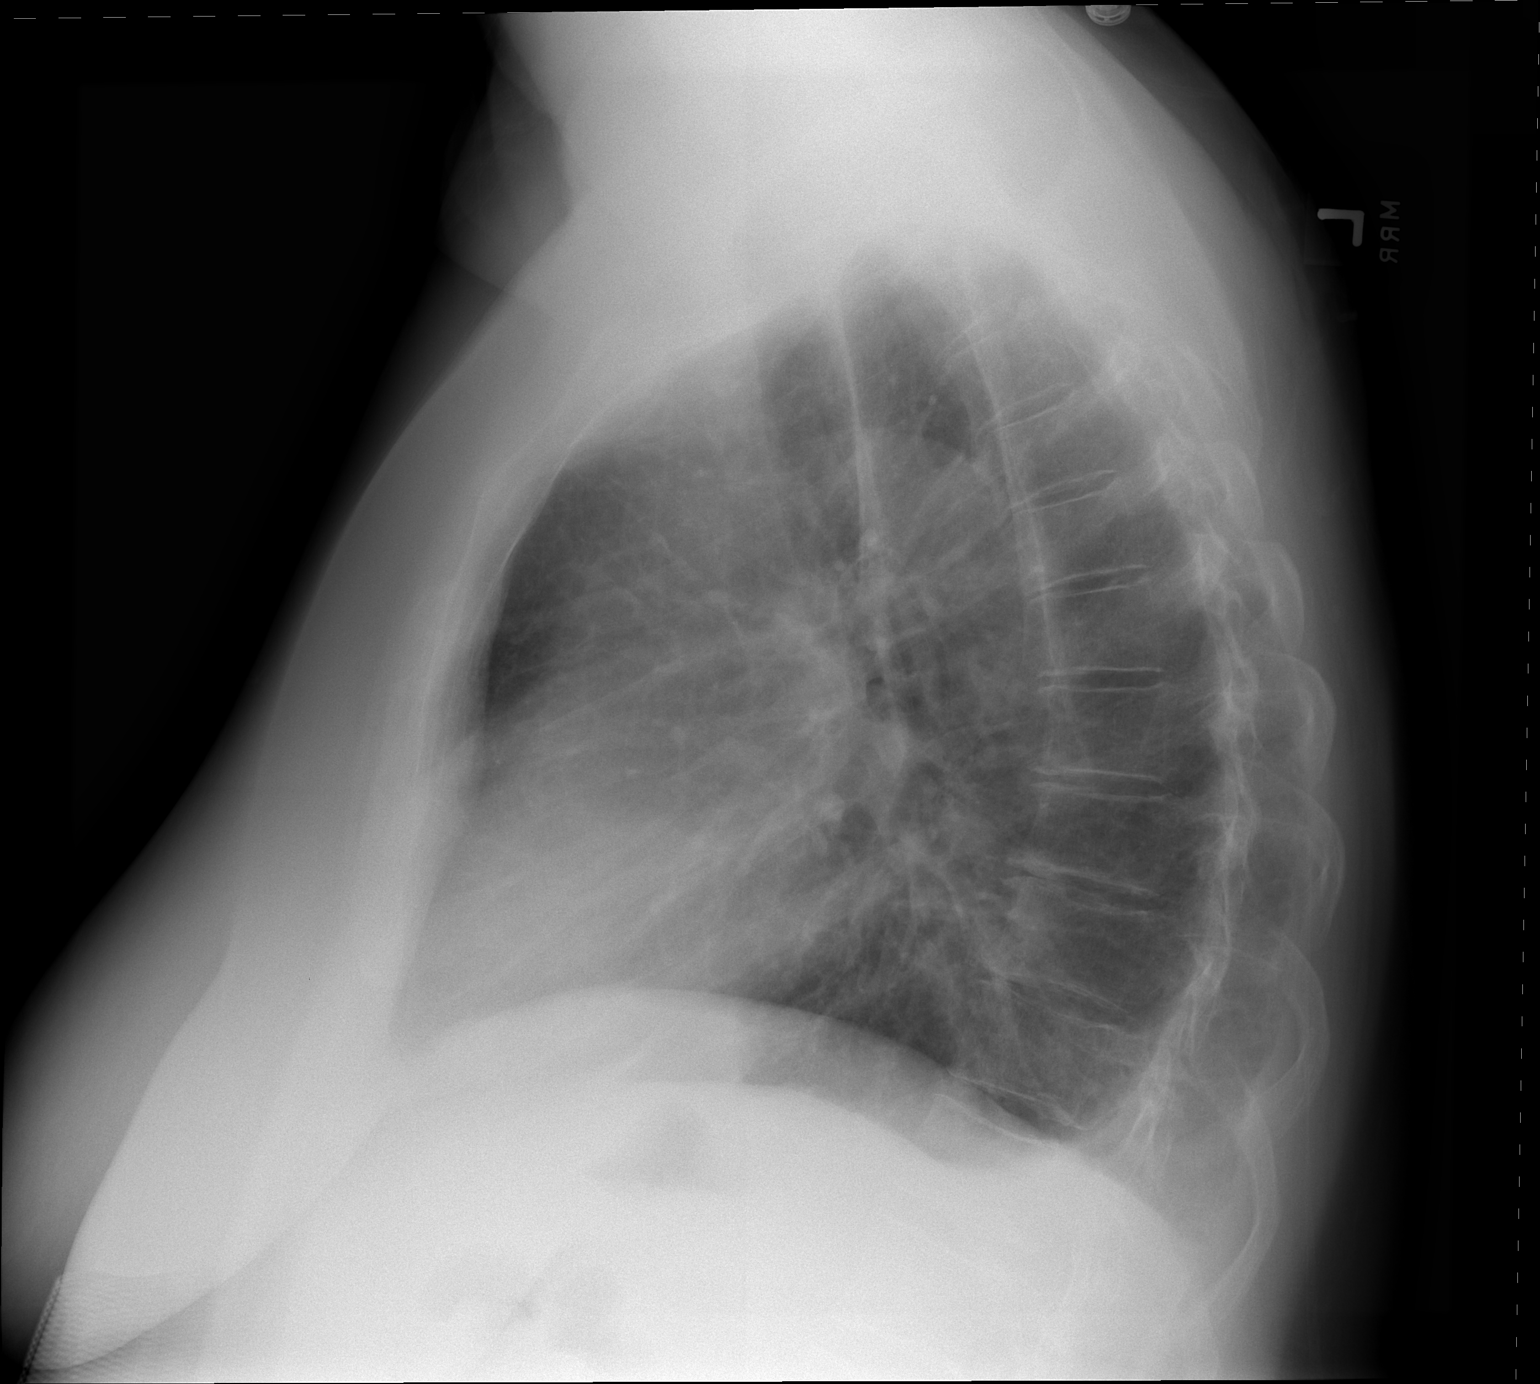

[2 of 2 positions shown; findings below may reference images not displayed]

FINDINGS: Trachea is midline. Heart size stable.  Tiny bilateral pleural effusions.
IMPRESSION: Tiny bilateral pleural effusions.

## 2008-05-10 IMAGING — CT CT PELVIS W/ CM
2 of 6 series · 16 of 46 positions shown, 18 images · IV contrast (omnipaque)
Comparison: CT?s of the abdomen and pelvis done 07/18/06.

CLINICAL DATA: Postop abdominal pain. Colostomy for rectal leak and vaginal vault suspension. 
 ABDOMEN CT WITH CONTRAST:
TECHNIQUE: Multidetector CT imaging of the abdomen was performed following the standard protocol during bolus administration of intravenous contrast.
 Contrast:  125 cc Omnipaque 300.  Oral contrast was given.
TECHNIQUE: Multidetector CT imaging of the pelvis was performed following the standard protocol during bolus administration of intravenous contrast.

[Series 2: abd_pel 5.0 b40s · axial · 0.74mm/px · z∈[-434,-60]mm · 13 of 85 slices shown, 15 images]
[im 5/85  soft-tissue]
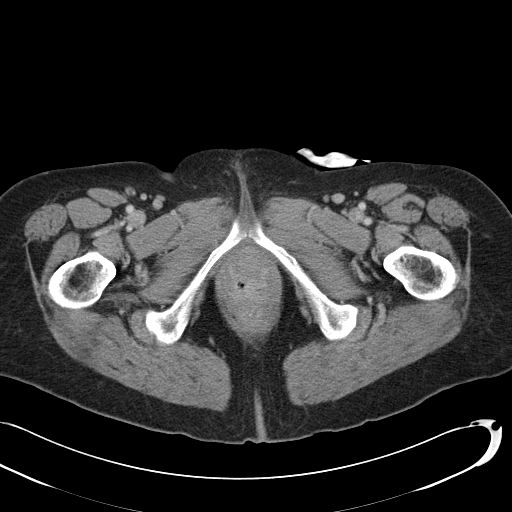
[im 5/85  bone]
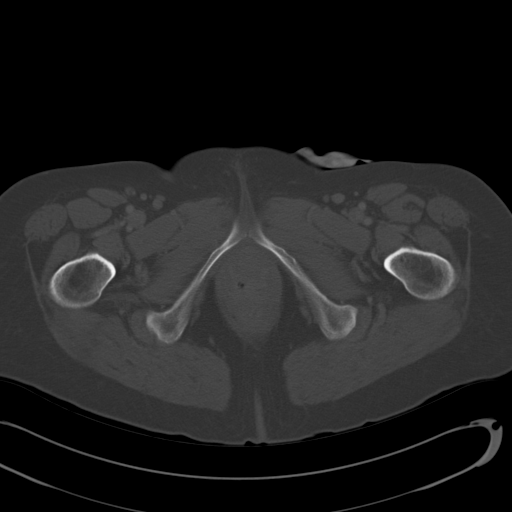
[im 10/85  soft-tissue]
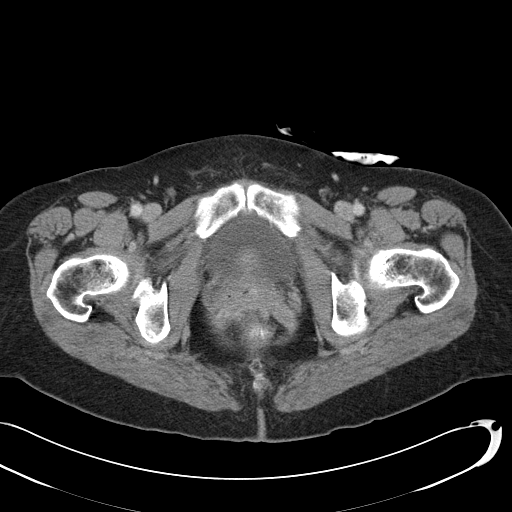
[im 19/85  soft-tissue]
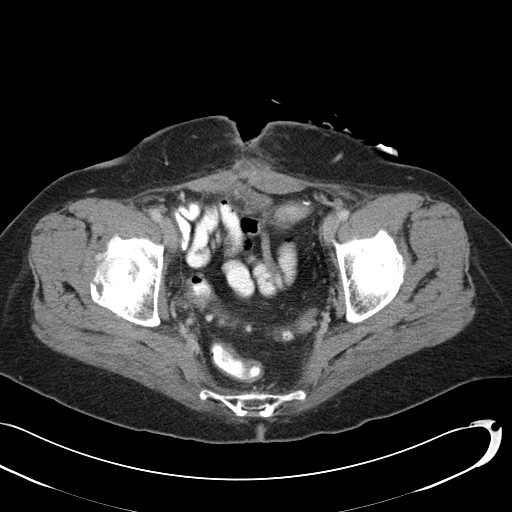
[im 24/85  soft-tissue]
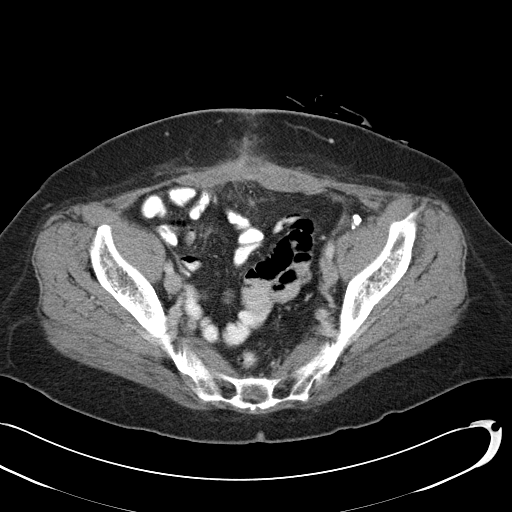
[im 29/85  soft-tissue]
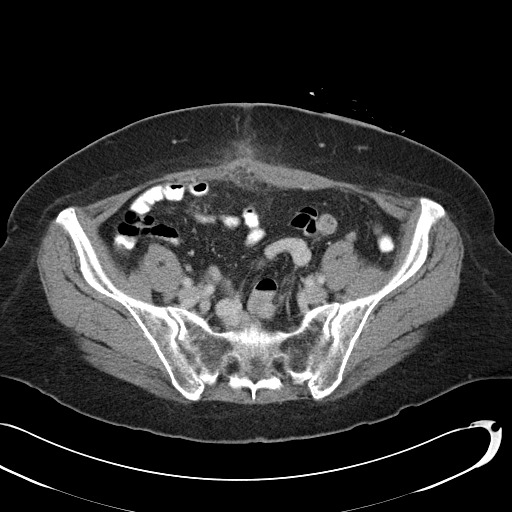
[im 38/85  soft-tissue]
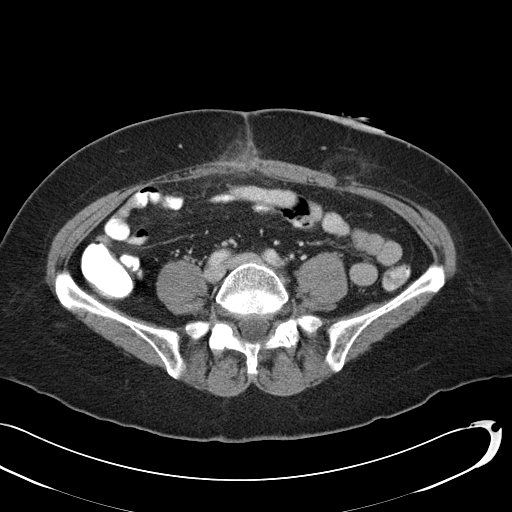
[im 43/85  soft-tissue]
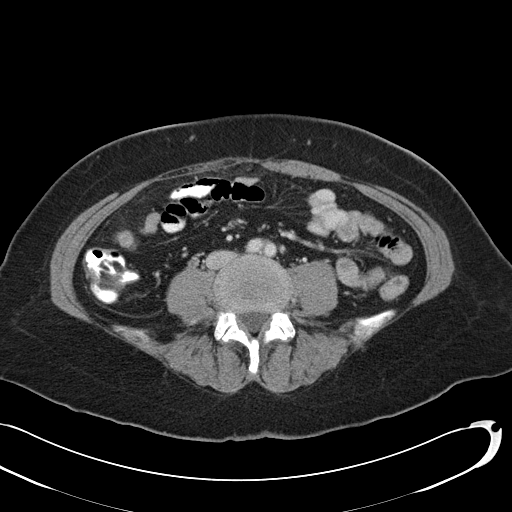
[im 47/85  soft-tissue]
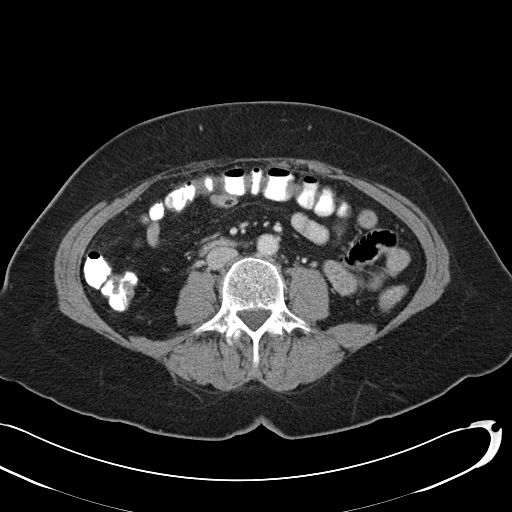
[im 57/85  soft-tissue]
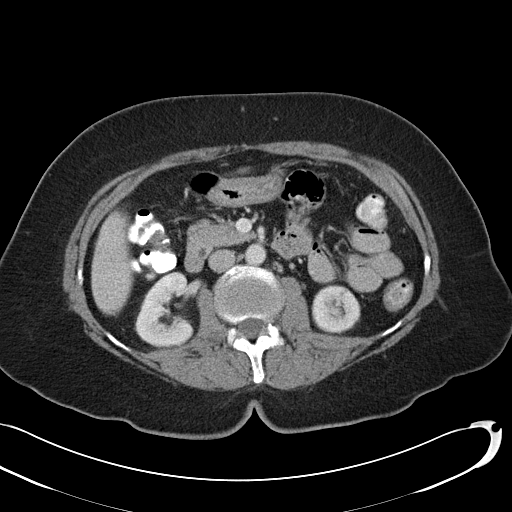
[im 57/85  bone]
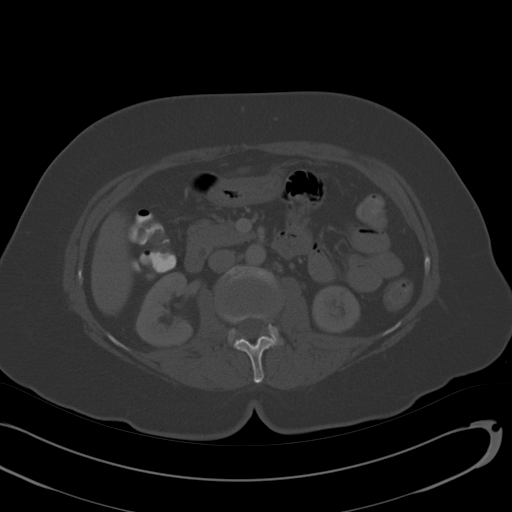
[im 61/85  soft-tissue]
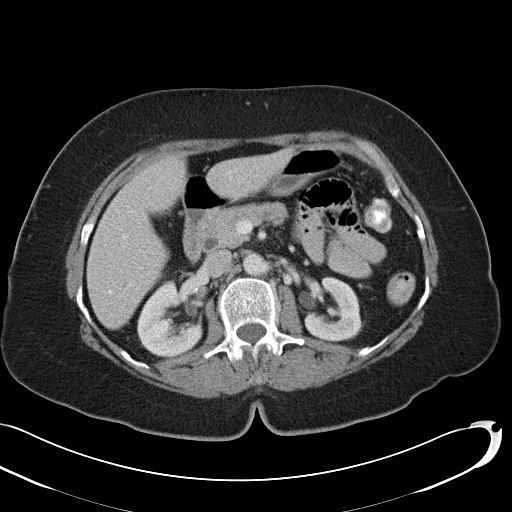
[im 66/85  soft-tissue]
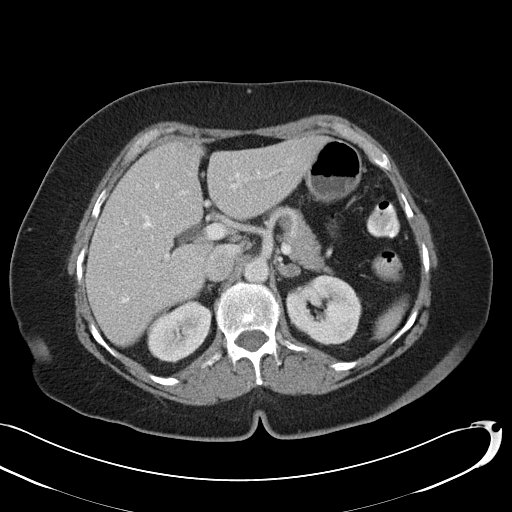
[im 75/85  soft-tissue]
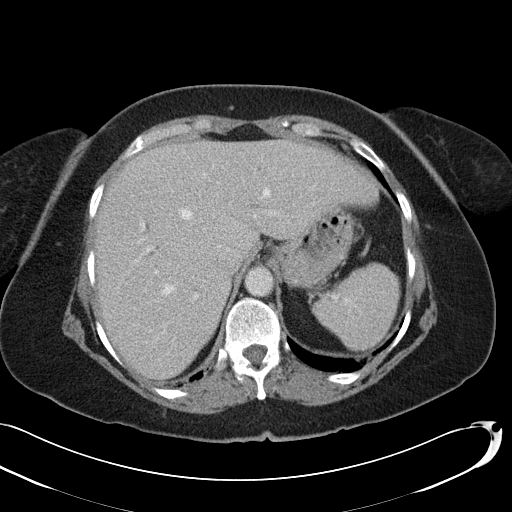
[im 80/85  soft-tissue]
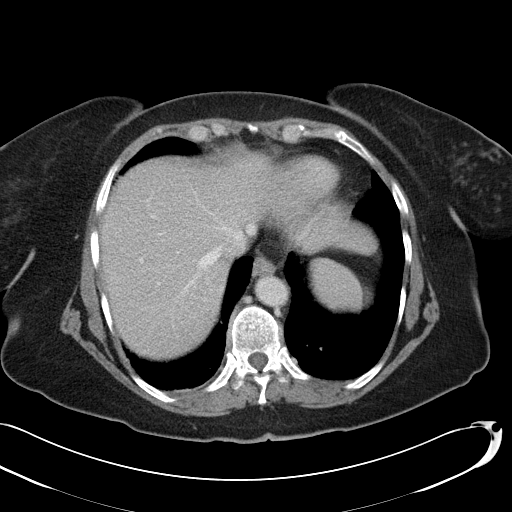

[Series 602: <mpr thick range> · coronal · 0.83mm/px · 3 of 70 slices shown]
[im 24/70  soft-tissue]
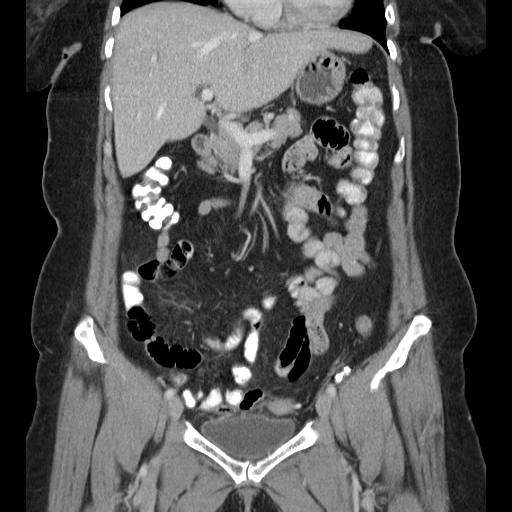
[im 31/70  soft-tissue]
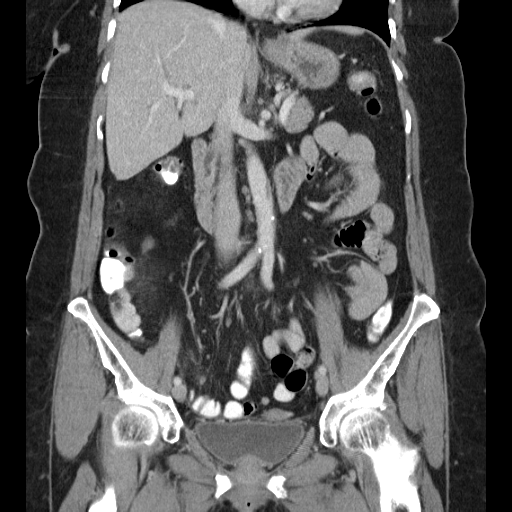
[im 39/70  soft-tissue]
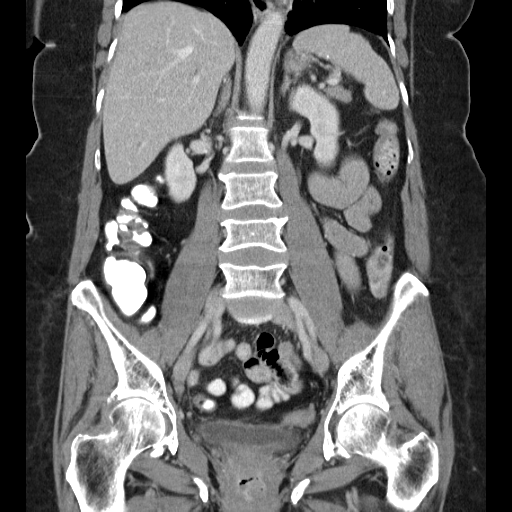

[16 of 46 positions shown; findings below may reference images not displayed]

FINDINGS: The lung bases are clear. There is no pleural effusion. There is no residual pneumoperitoneum.  The patient has undergone interval sigmoid colostomy. Post-surgical changes are further described below.  No extraluminal air collections are seen within the abdomen. 
 The liver appears stable. There is stable mild intra and extrahepatic biliary dilatation status-post cholecystectomy. No pancreatic mass is demonstrated.  There is no pancreatic ductal dilatation. The spleen and adrenal glands are unchanged with a probable 9 x 15 mm left adrenal adenoma. The kidneys appear stable with small cortical cysts bilaterally.  There is a right renal artery aneurysm measuring 10 mm in diameter on image 26.
IMPRESSION: 1.  Interval resolution of previously demonstrated pneumoperitoneum. No evidence of intraabdominal abscess. 
 2.  No acute abdominal findings are seen. Biliary dilatation appears stable, probably physiologic status-post cholecystectomy. Correlation with liver function tests is recommended.  A probable left adrenal adenoma and a small right renal artery aneurysm appear stable. 
 PELVIS CT WITH CONTRAST:
FINDINGS: As noted above, the patient has undergone interval sigmoid colostomy. There are postsurgical changes in the suprapubic anterior abdominal wall with an open incision inferiorly. A small amount of air and fluid remains within the suprapubic subcutaneous fat. No direct extension of this process into the peritoneal cavity or prevesical fat is identified. However, in the pelvic cul-de-sac, there is a residual ill-defined collection of fluid and air measuring approximately 3.0 x 3.2 cm transverse on image 72.  This is significantly smaller than it was previously and not well defined. No drainable fluid collection is seen.  There is no extravasated contrast from the bowel.
IMPRESSION: Interval sigmoid colostomy with postsurgical changes in the anterior abdominal wall and a small residual ill-defined fluid collection in the pelvic cul-de-sac. There is no evidence of persistent bowel leak or drainable pelvic abscess.

## 2008-05-24 ENCOUNTER — Encounter: Admission: RE | Admit: 2008-05-24 | Discharge: 2008-05-24 | Payer: Self-pay | Admitting: Surgery

## 2008-06-02 ENCOUNTER — Ambulatory Visit (HOSPITAL_COMMUNITY): Admission: RE | Admit: 2008-06-02 | Discharge: 2008-06-02 | Payer: Self-pay | Admitting: Surgery

## 2008-06-17 ENCOUNTER — Emergency Department (HOSPITAL_COMMUNITY): Admission: EM | Admit: 2008-06-17 | Discharge: 2008-06-17 | Payer: Self-pay | Admitting: Emergency Medicine

## 2008-06-19 IMAGING — CT CT PELVIS W/ CM
2 of 6 series · 13 of 46 positions shown, 18 images · IV contrast (omnipaque)
Comparison: 08/06/2006

PELVIS CT WITH CONTRAST:

Addendum Begins
The patient initially returned the same day as the original scan for
administration of rectal contrast to exclude a rectal leak. However, the patient
had some considerable discomfort with placement of the barium enema tube in the
rectum and although the technologist filled the Hartmann's pouch to the level of
the patient tolerance, only a very minimal of amount of contrast was delivered
into the rectum and sigmoid colon. For this reason, the patient was brought back
2 days later for repeat evaluation. This time we used a small gauge soft
flexible rectal catheter and injected contrast using a syringe. Again, the
Hartmann's pouch was filled to the level of patient tolerance which was
approximately 180 cc. There is no evidence for contrast extravasation from the
rectum to suggest a persistent rectal leak.
Addendum Ends
CLINICAL DATA: Rectal injury during bladder sling operation.
TECHNIQUE: Multidetector CT imaging of the pelvis was performed following the
standard protocol during administration of intravenous contrast.

Contrast:  100 cc Omnipaque 300

[Series 3: routine pelvis · axial · 0.70mm/px · z∈[-234,-80]mm · 10 of 37 slices shown, 15 images]
[im 3/37  soft-tissue]
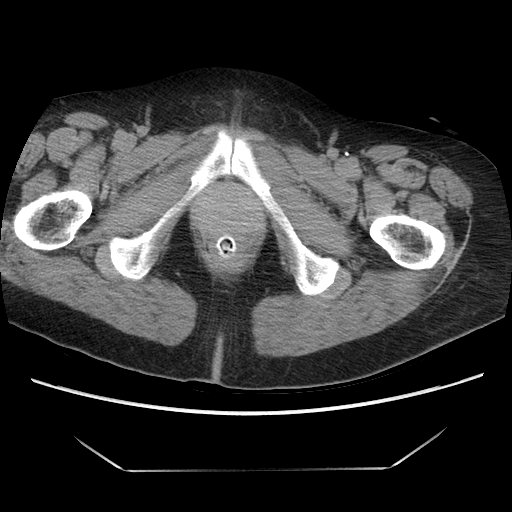
[im 3/37  bone]
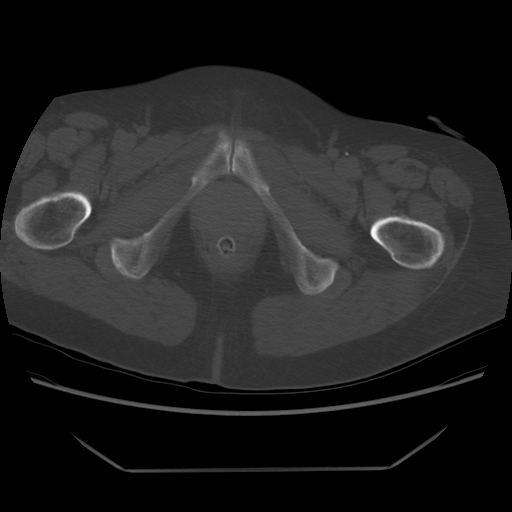
[im 8/37  soft-tissue]
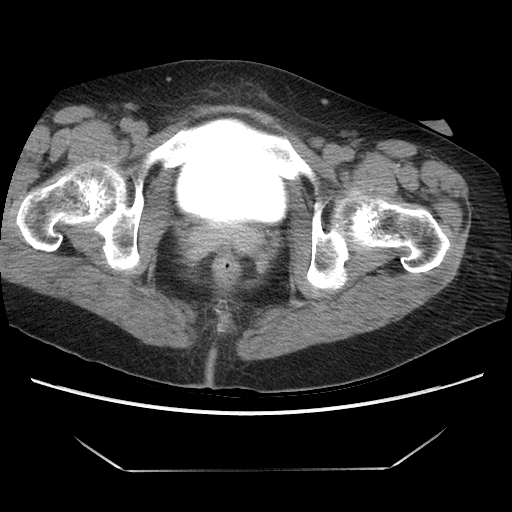
[im 10/37  soft-tissue]
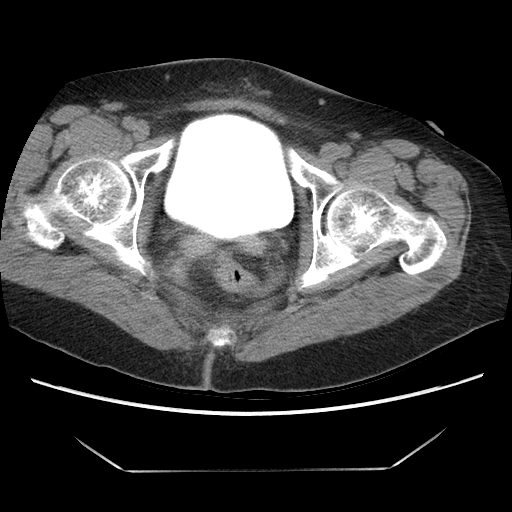
[im 15/37  soft-tissue]
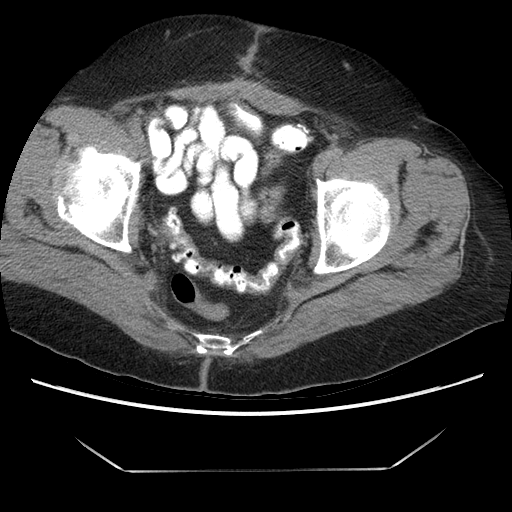
[im 20/37  soft-tissue]
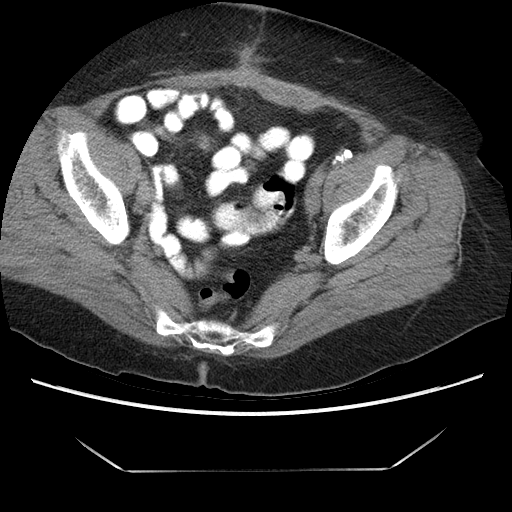
[im 22/37  soft-tissue]
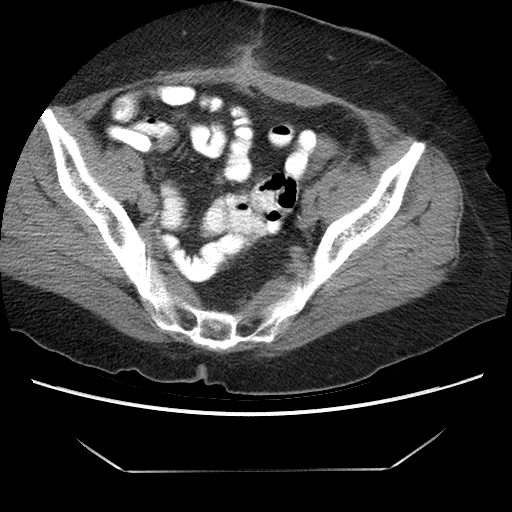
[im 27/37  soft-tissue]
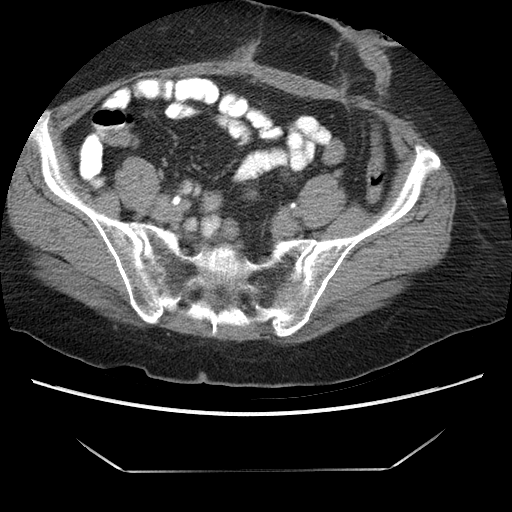
[im 27/37  lung]
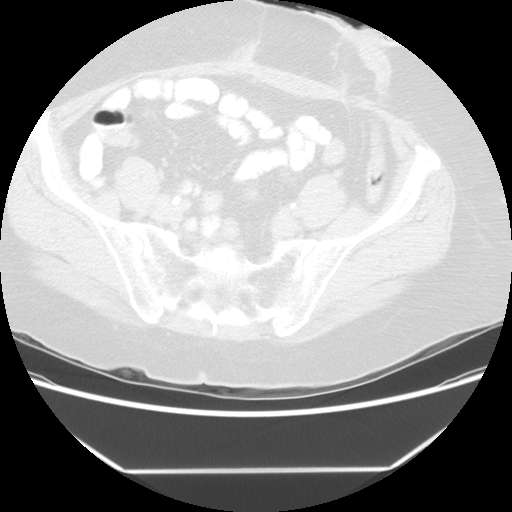
[im 29/37  soft-tissue]
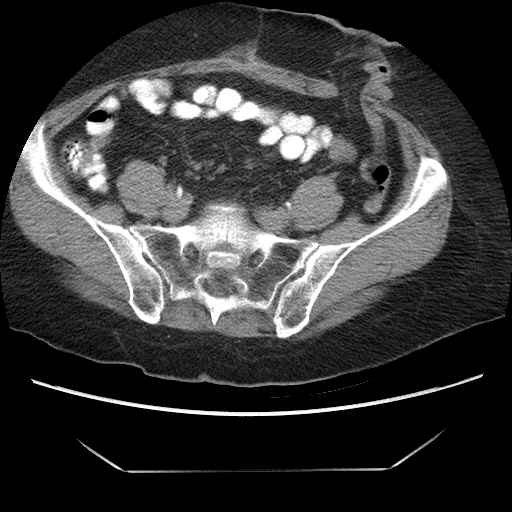
[im 29/37  lung]
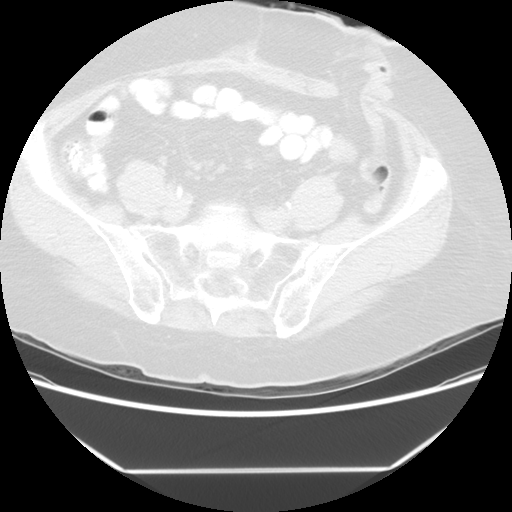
[im 32/37  lung]
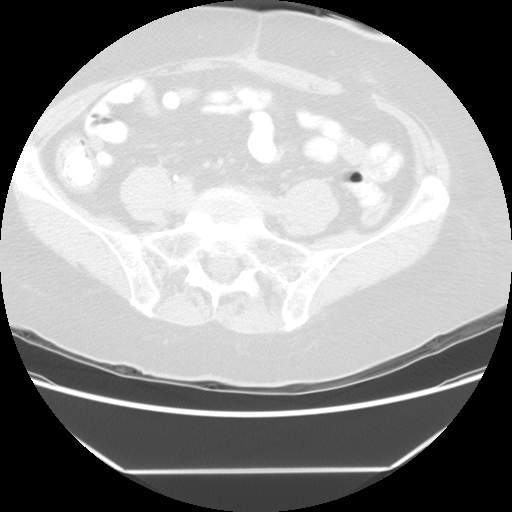
[im 34/37  soft-tissue]
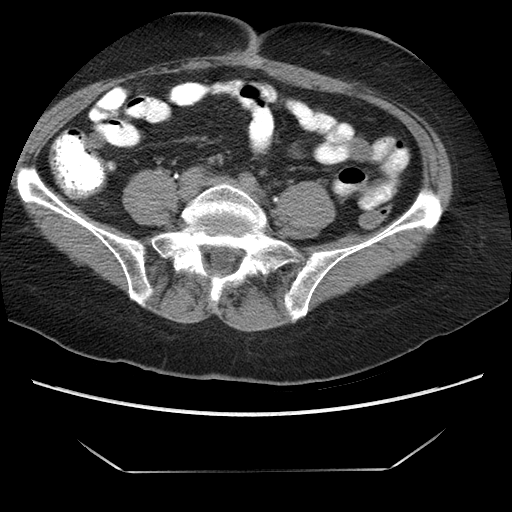
[im 34/37  lung]
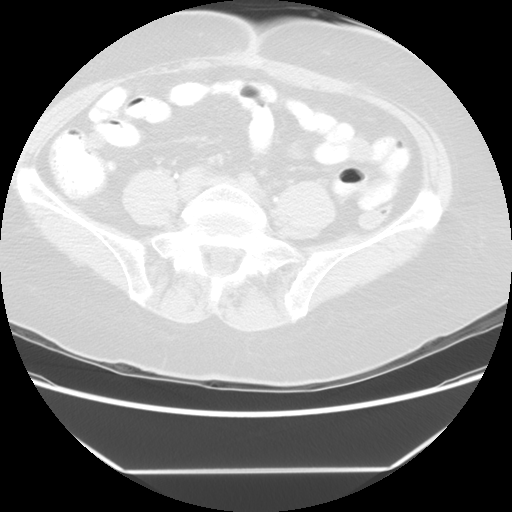
[im 34/37  bone]
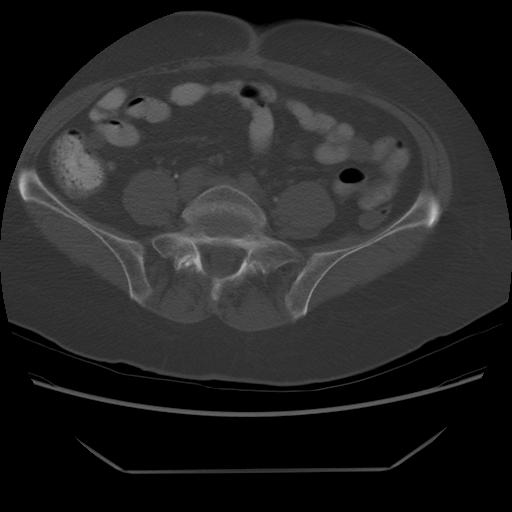

[Series 602: sagittal body · sagittal · 0.78mm/px · 3 of 160 slices shown]
[im 40/160  soft-tissue]
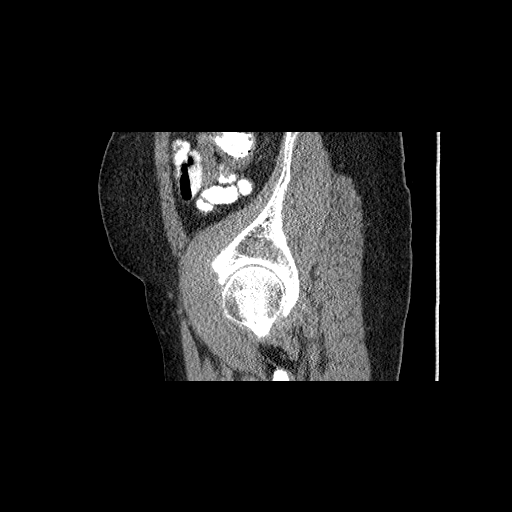
[im 80/160  soft-tissue]
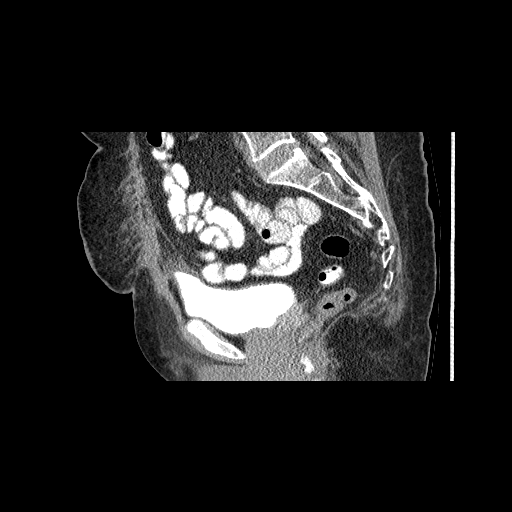
[im 120/160  soft-tissue]
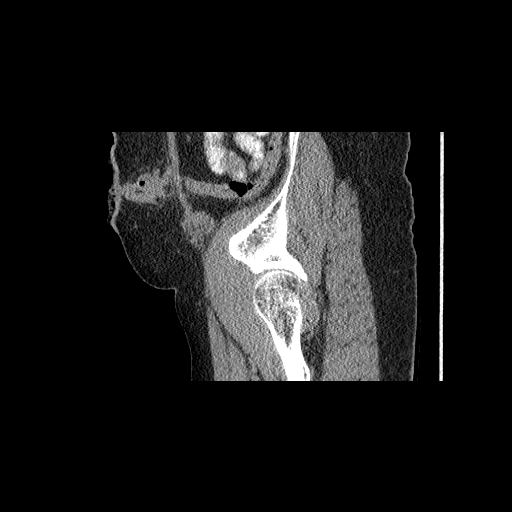

[13 of 46 positions shown; findings below may reference images not displayed]

FINDINGS: There is no intraperitoneal free fluid. A small collection of fluid
and gas seen just anterior to the rectum on the previous study has resolved.
There is a small amount of residual edema / inflammation or evolving scar at
this location on today's study. Residual contrast material is seen in the
Hartmann's pouch with diverticular change in the sigmoid colon. Sigmoid
colostomy is noted in the left lower quadrant. The small collections of air and
fluid seen in the midline rectus sheath on the previous study have also
resolved. The soft tissue stranding within the subcutaneous fat of the midline
is probably related to the surgical scar.

Patient is status post hysterectomy. There is no evidence for adnexal mass.
Small bowel loops in the anatomic pelvis are unremarkable.
IMPRESSION: Interval resolution of the small collection of fluid and air seen just anterior
to the rectum on the previous study. The small fluid and air collections seen in
the rectus sheath on the previous study have also resolved.

## 2008-10-27 IMAGING — CR DG ABDOMEN ACUTE W/ 1V CHEST
4 series · 4 of 4 positions shown · non-contrast
Comparison: Plain film of the chest 10/19/06.

CLINICAL DATA: Nausea, abdominal pain, small bowel obstruction.
 ACUTE ABDOMEN WITH CHEST ? 3 VIEWS ? 01/23/07: 
 Chest and two views of the abdomen.

[w chest pa]
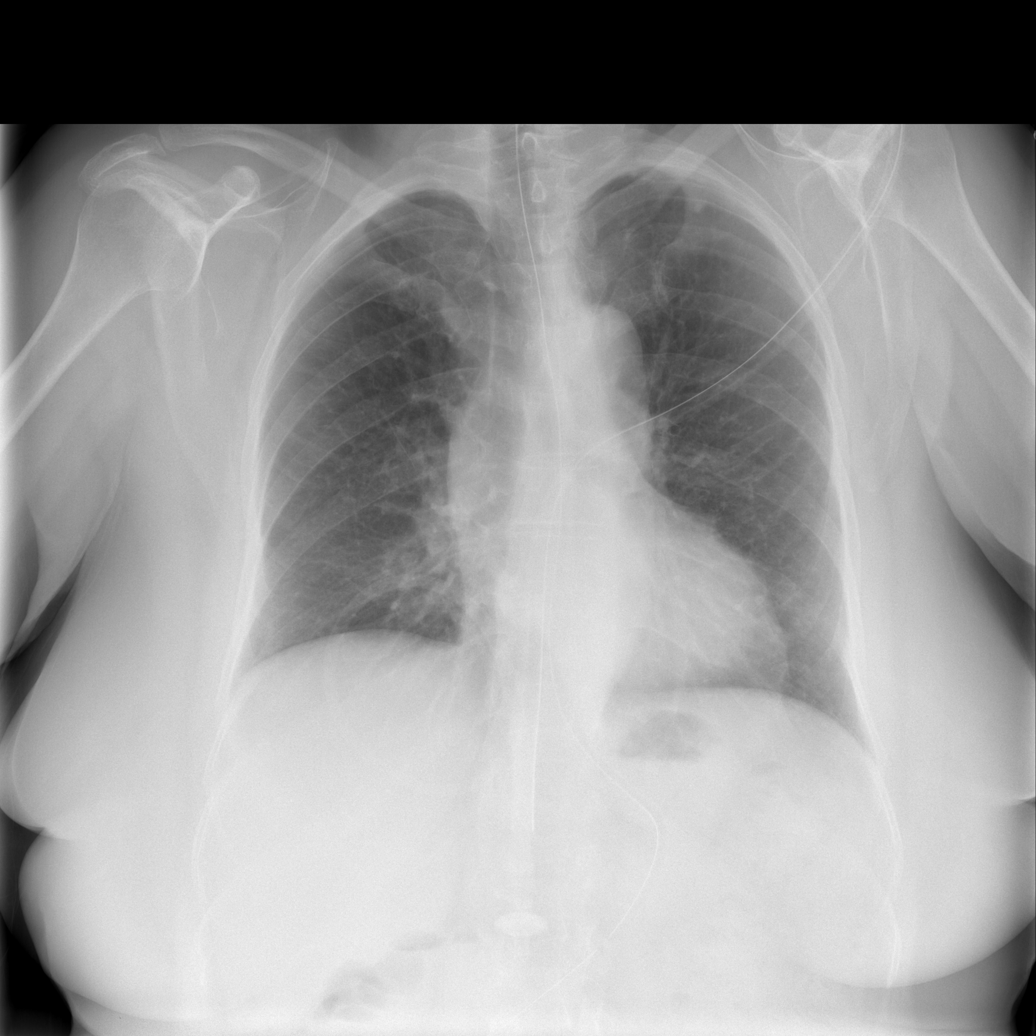

[w abdomen upright *]
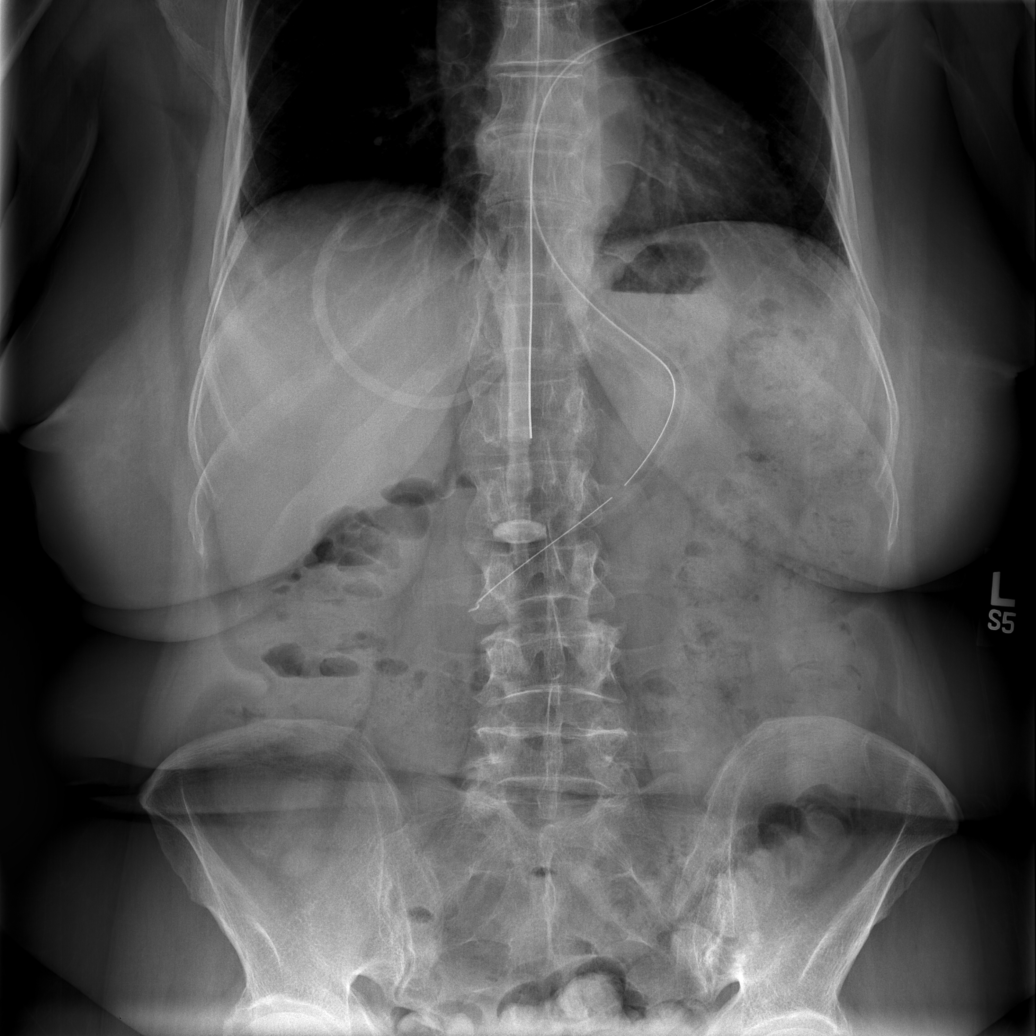

[t abdomen supine (1 of 2)]
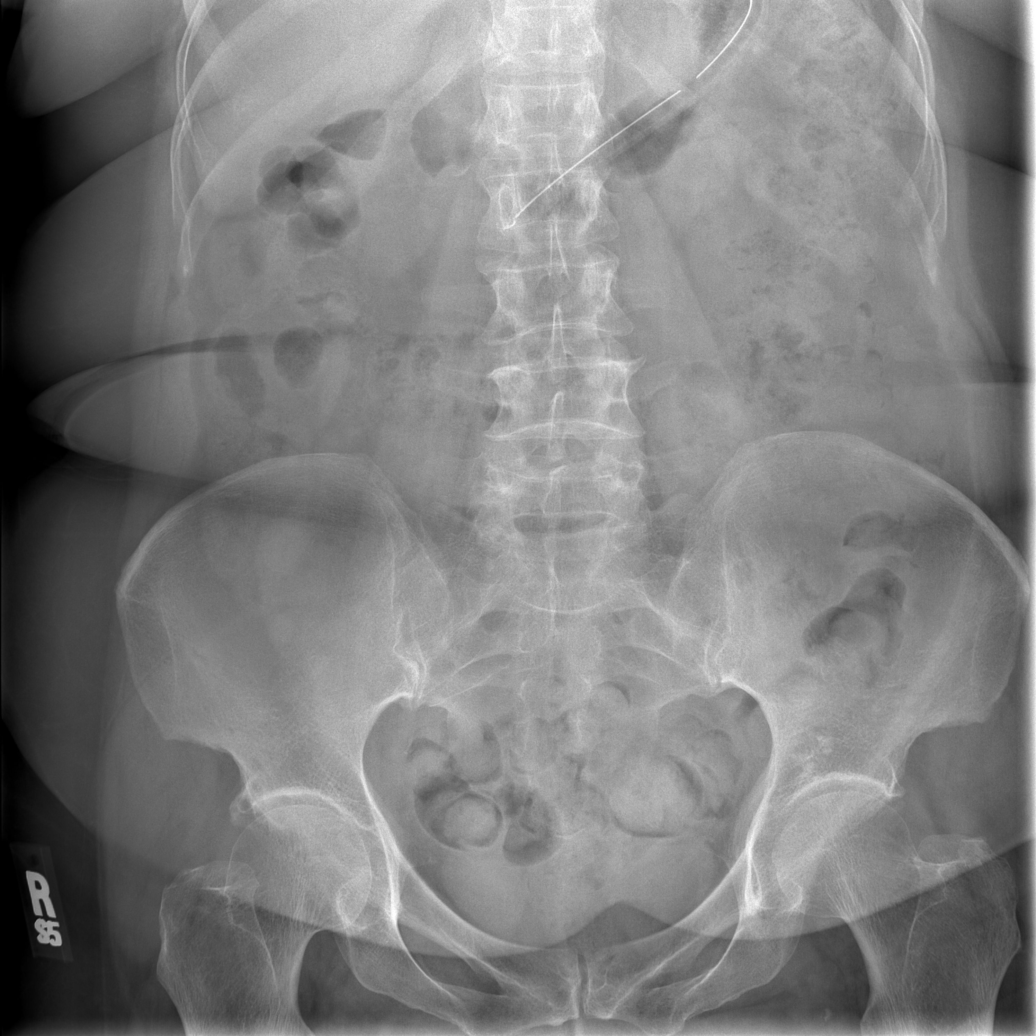

[t abdomen supine (2 of 2)]
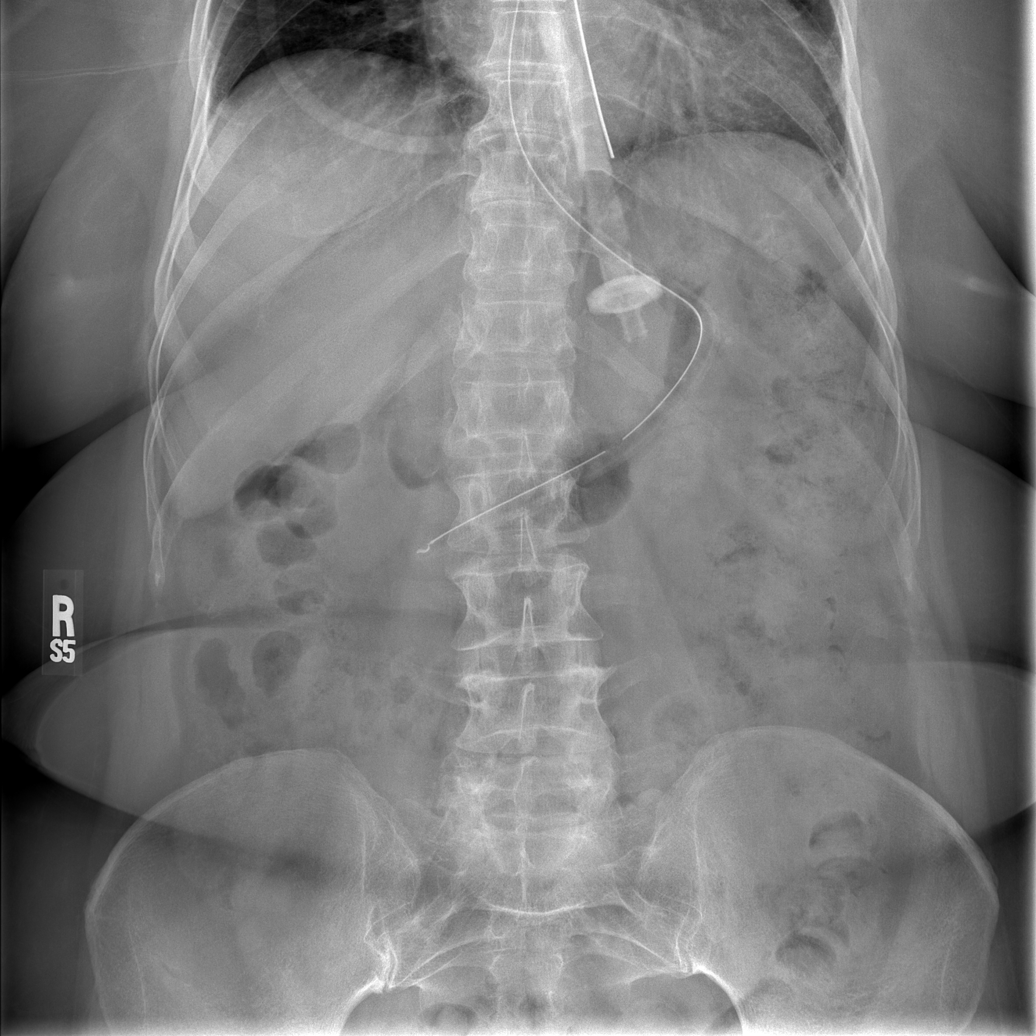

[4 of 4 positions shown; findings below may reference images not displayed]

FINDINGS: A nasogastric tube is in placed with the tip in the distal stomach.  The lungs are clear.   The heart size is normal.   
 Two views of the abdomen demonstrate no free intraperitoneal air.  Gas and stool are scattered throughout the colon.  No gas-filled dilated loops of small bowel are identified.  No focal bony abnormality.
IMPRESSION: 1.  No acute cardiopulmonary disease. 
 2.  Negative for free air or plain film evidence of small bowel obstruction.

## 2008-10-28 IMAGING — CT CT ABDOMEN W/ CM
2 of 5 series · 16 of 46 positions shown, 18 images · IV contrast (APPLIED)
Comparison: none

CLINICAL DATA: 66-year-old female with abdominal  pain and distention. 
 ABDOMEN CT WITH CONTRAST:
TECHNIQUE: Multidetector CT imaging of the abdomen was performed following the standard protocol during bolus administration of intravenous contrast.
 Contrast:  125 cc Omnipaque 300
TECHNIQUE: Multidetector CT imaging of the pelvis was performed following the standard protocol during bolus administration of intravenous contrast.

[Series 2: abd_pel 5.0 b40f st · axial · 0.73mm/px · z∈[-292,+134]mm · 13 of 96 slices shown, 15 images]
[im 6/96  soft-tissue]
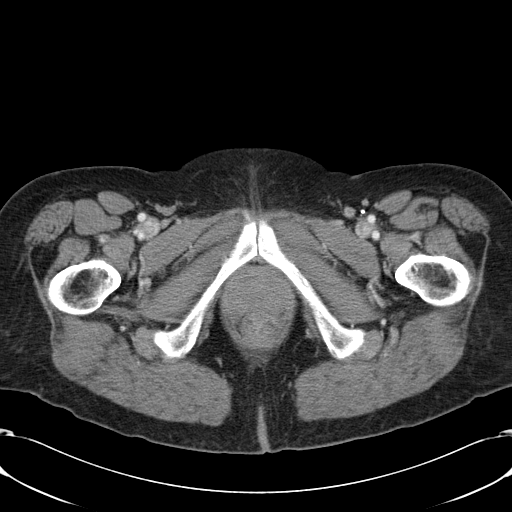
[im 6/96  bone]
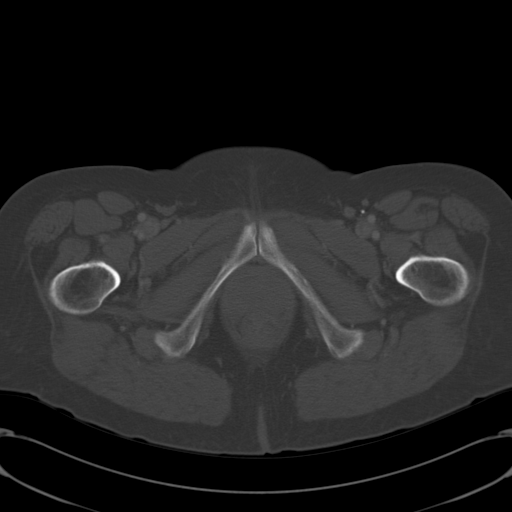
[im 16/96  soft-tissue]
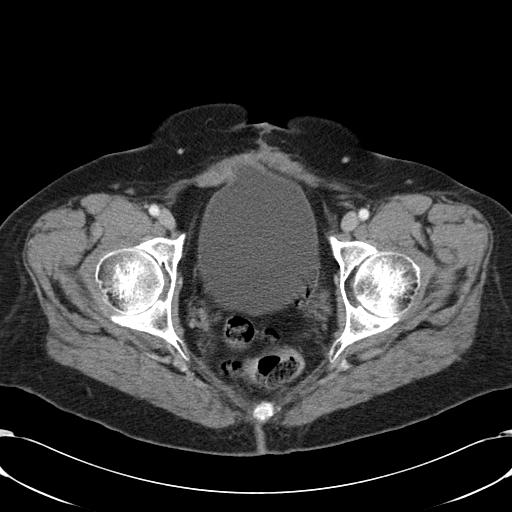
[im 21/96  soft-tissue]
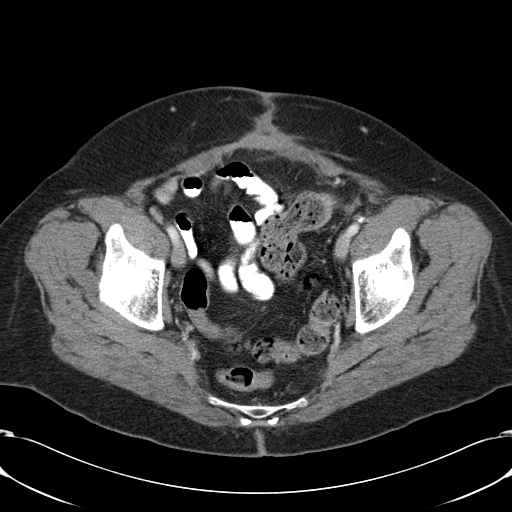
[im 26/96  soft-tissue]
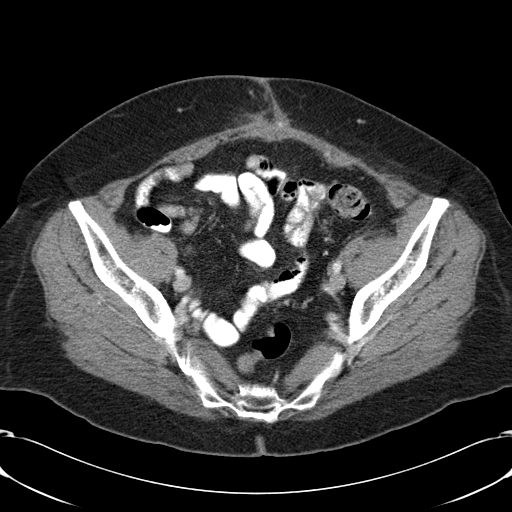
[im 36/96  soft-tissue]
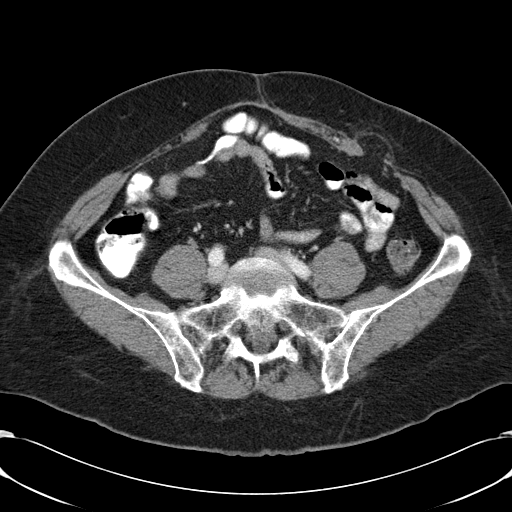
[im 41/96  soft-tissue]
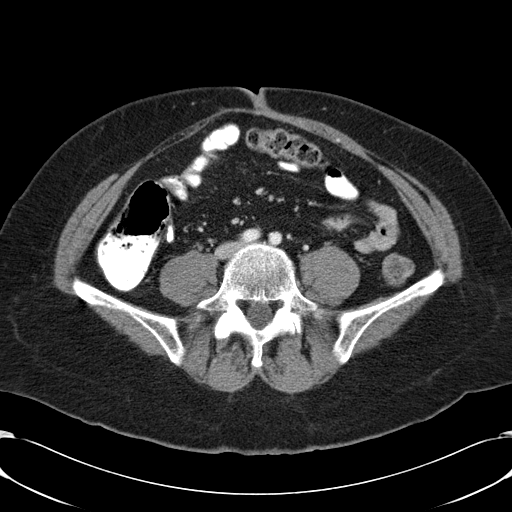
[im 51/96  soft-tissue]
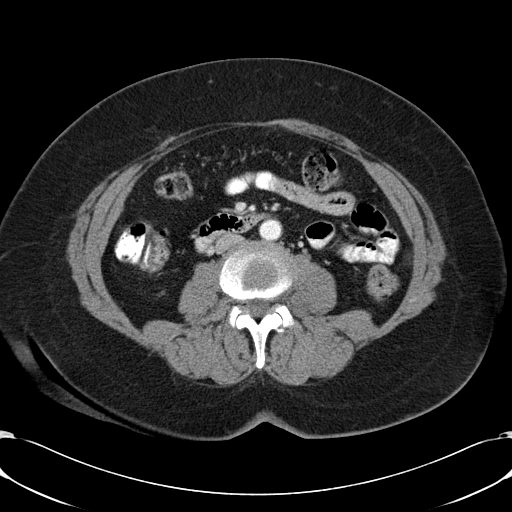
[im 56/96  soft-tissue]
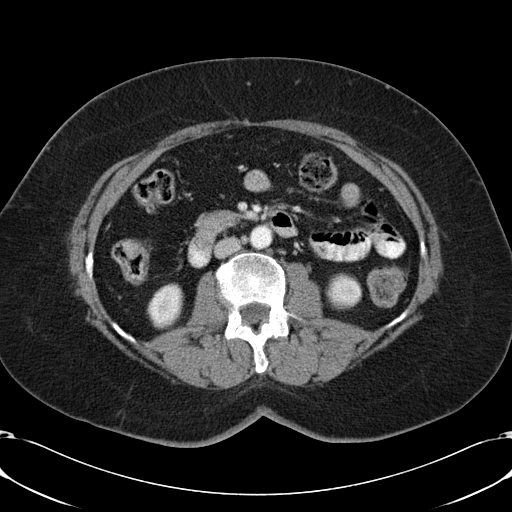
[im 61/96  soft-tissue]
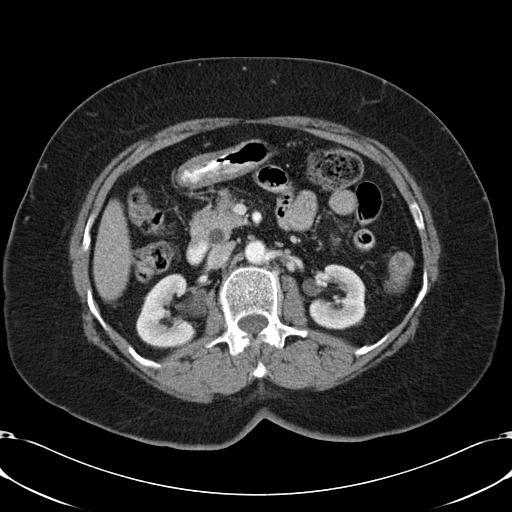
[im 61/96  bone]
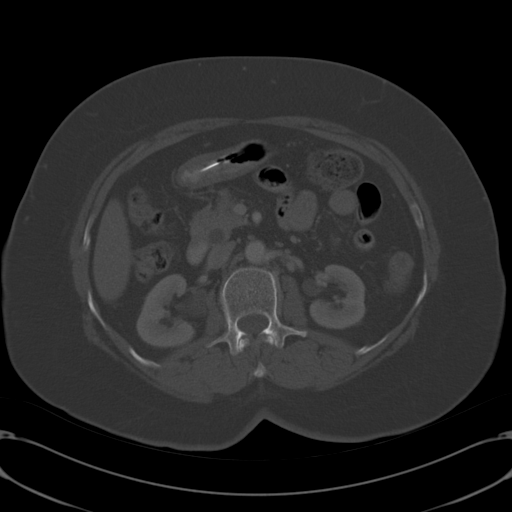
[im 71/96  soft-tissue]
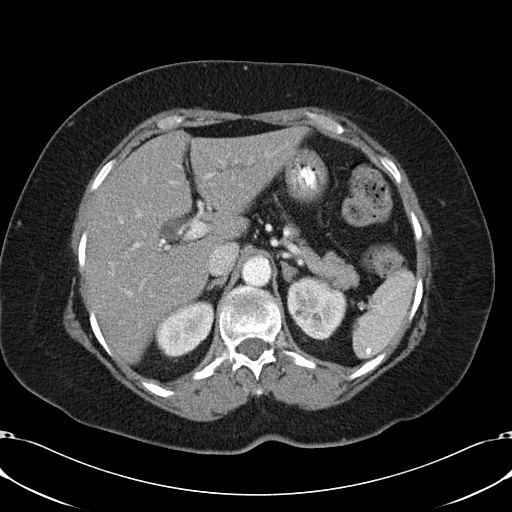
[im 76/96  soft-tissue]
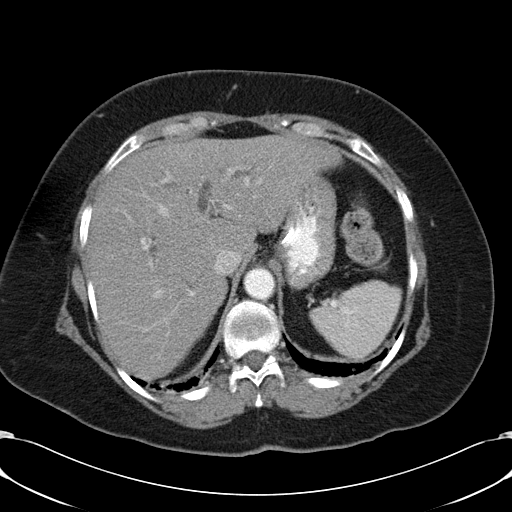
[im 81/96  soft-tissue]
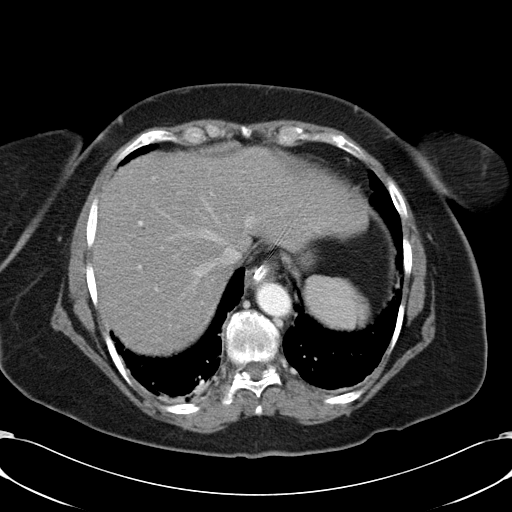
[im 91/96  soft-tissue]
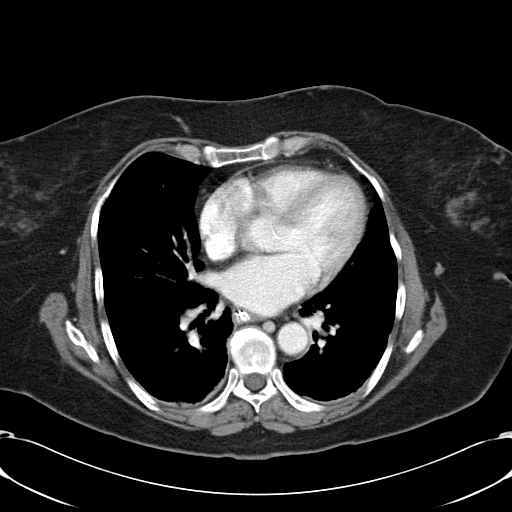

[Series 602: coronal · coronal · 0.97mm/px · 3 of 79 slices shown]
[im 27/79  soft-tissue]
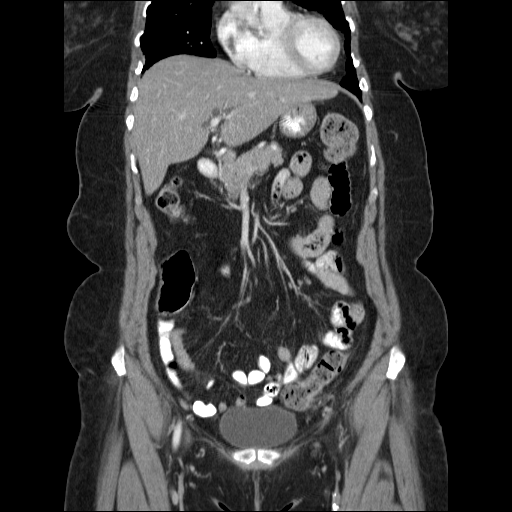
[im 35/79  soft-tissue]
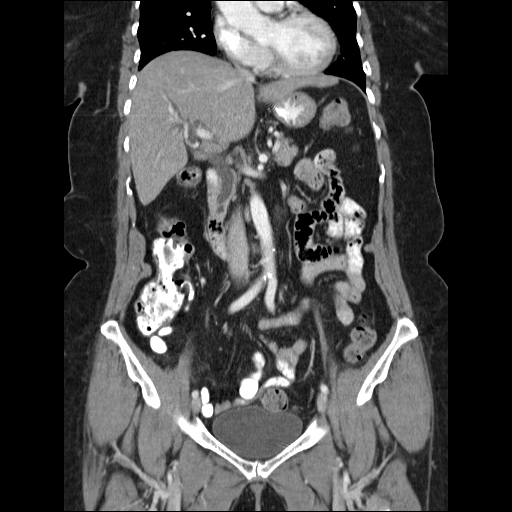
[im 44/79  soft-tissue]
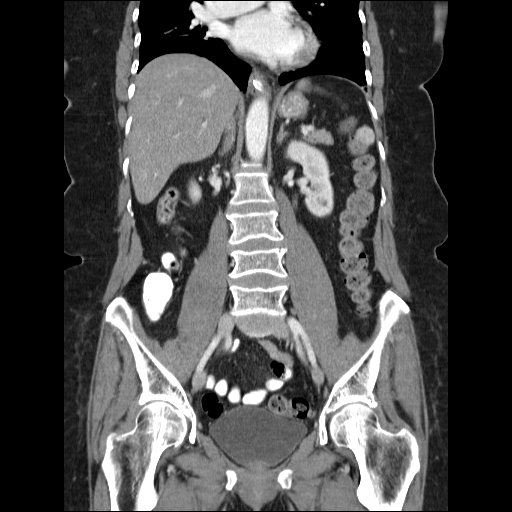

[16 of 46 positions shown; findings below may reference images not displayed]

FINDINGS: Lung bases demonstrate mild bibasilar atelectasis. An NG tube is in place. Heart size is mildly enlarged. There is no significant pleural or pericardial effusion. 
 There is interval increase in both intra and extrahepatic biliary dilation. Intrahepatic ducts measure up to 10 mm. An extrahepatic duct at the level of the pancreatic head measures 19 mm compared with 12 mm previously.  No definite obstructing lesion or stone is identified. The remainder of the pancreas is unremarkable. Calcifications are again seen within the spleen. No other focal lesions are identified. 
 There is a stable nodular appearance of the left adrenal gland measuring 8 mm in width. The right adrenal gland is unremarkable.  Subcentimeter cystic lesions in the left kidney are unchanged. Extrarenal collecting system is noted on the right. There is no significant abdominal lymphadenopathy or free fluid. Bowel is unremarkable. Contrast can be seen to the level of the cecum. There is no evidence for obstruction or free air.
IMPRESSION: 1.   Increase in intra and extrahepatic biliary dilation. An obstructing mass or stone is not identified. 
 2.  Stable renal cystic lesions.
 3.  Stable left adrenal nodule. 
 4.  NG tube in situ.
 PELVIS CT WITH CONTRAST:
FINDINGS: The rectosigmoid colon is unremarkable. The patient?s colostomy has been reversed. There is a small fat-containing hernia at the level of the colostomy. Air and stool are seen throughout the colon. Contrast can be seen into the cecum as stated above.  Appendix is not clearly identified. 
 There is thickening of the ventral peritoneum where the patient?s previous surgeries have been.  There is no residual fluid collection. The urinary bladder is unremarkable. Patient is status post hysterectomy. The ovaries are not identified. 
 Bone windows demonstrate no focal lytic or blastic lesions. There are degenerative changes of the lower lumbar facets.
IMPRESSION: 1.   Status post reversal of colostomy without complication. 
 2.  Thickening of the ventral peritoneum compatible with prior surgeries.
 3.  No acute abnormality of the pelvis.

## 2008-11-07 IMAGING — US US ABDOMEN COMPLETE
1 series · 13 of 25 positions shown · non-contrast
Comparison: CT 01/24/2007 and 09/15/2006

CLINICAL DATA: Abnormal liver function tests. dilated biliary ducts seen on CT.

ABDOMEN ULTRASOUND
TECHNIQUE: Complete abdominal ultrasound examination was performed including
evaluation of the liver, gallbladder, bile ducts, pancreas, kidneys, spleen,
IVC, and abdominal aorta.

[Series 1: us abdomen complete · 0.32mm/px · 13 of 55 slices shown]
[im 1/55]
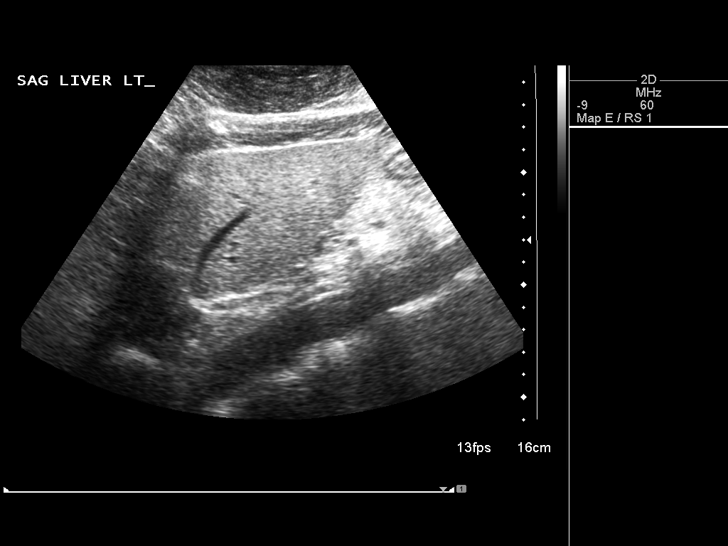
[im 5/55]
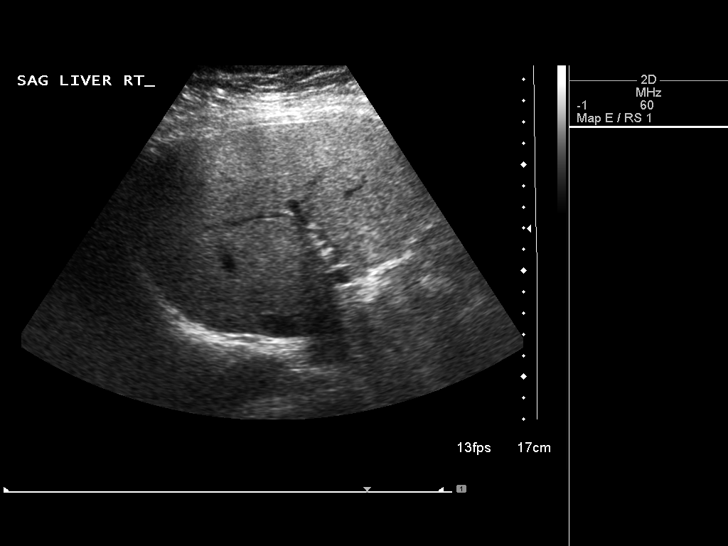
[im 10/55]
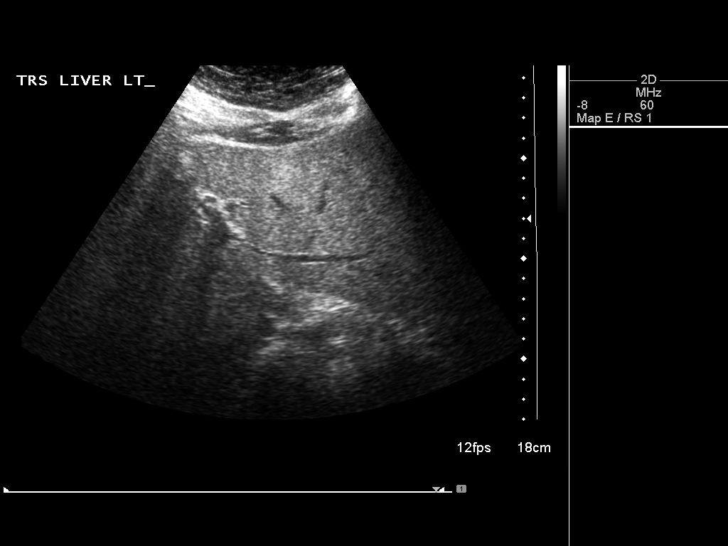
[im 14/55]
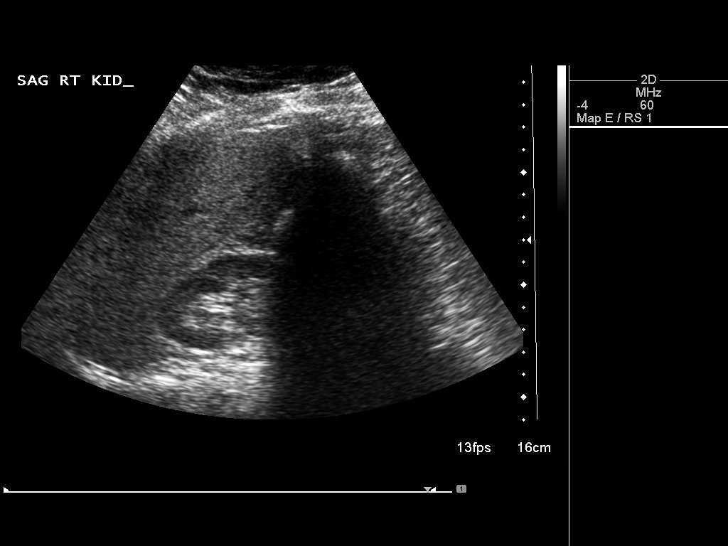
[im 19/55]
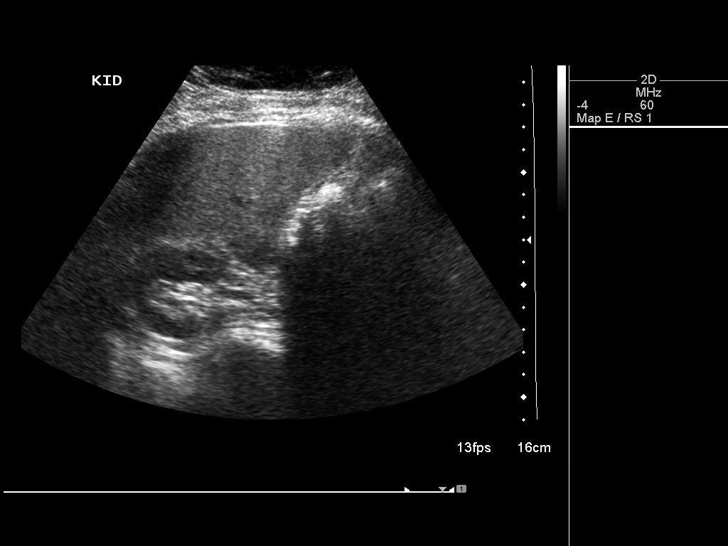
[im 23/55]
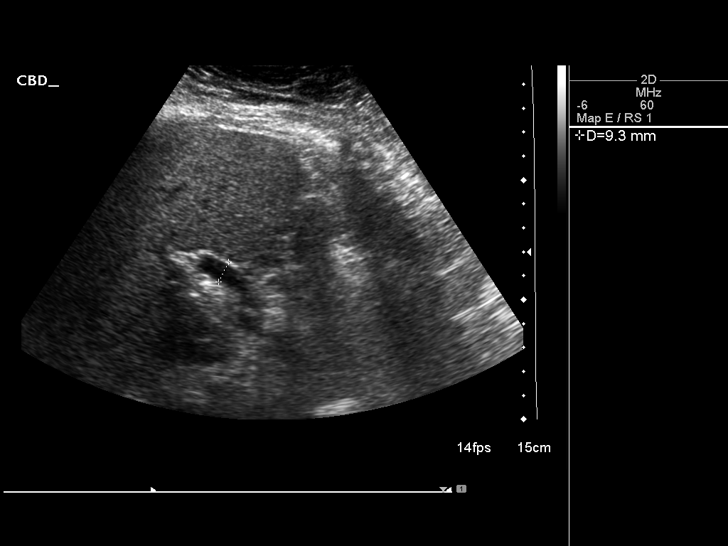
[im 28/55]
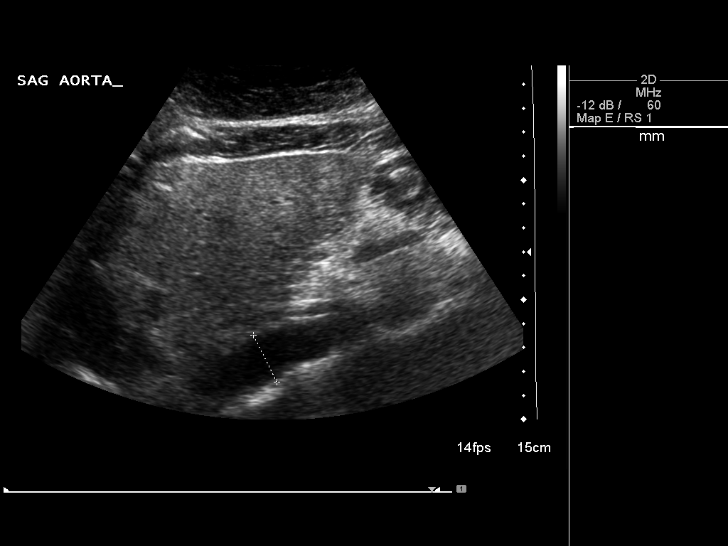
[im 32/55]
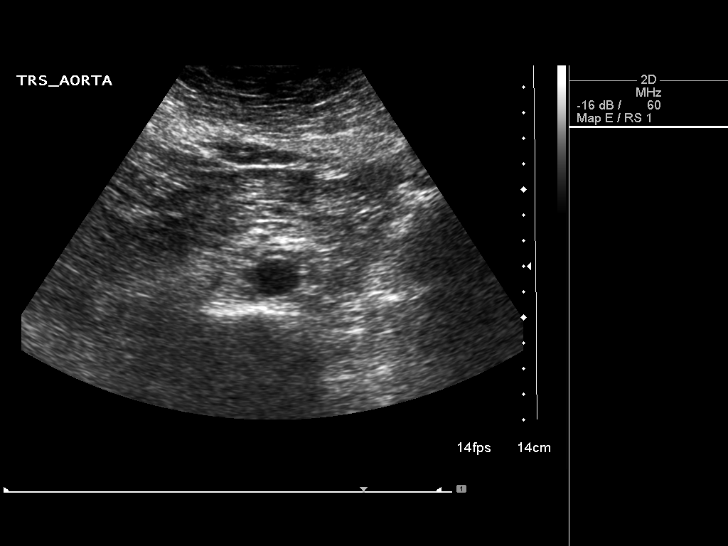
[im 37/55]
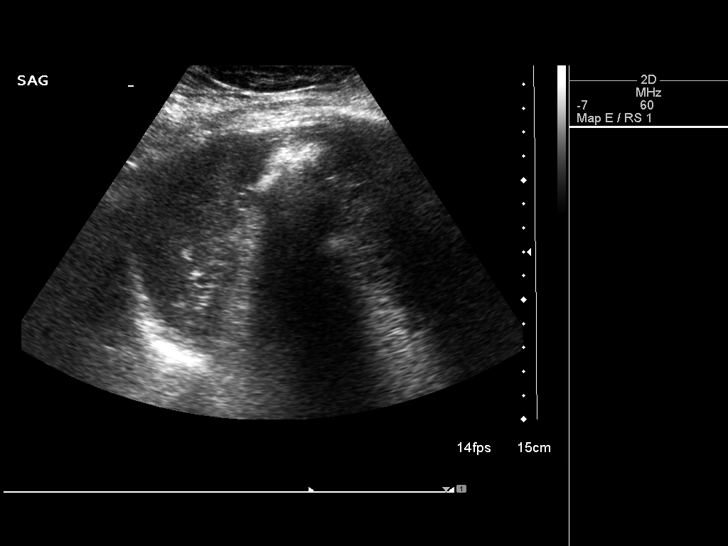
[im 41/55]
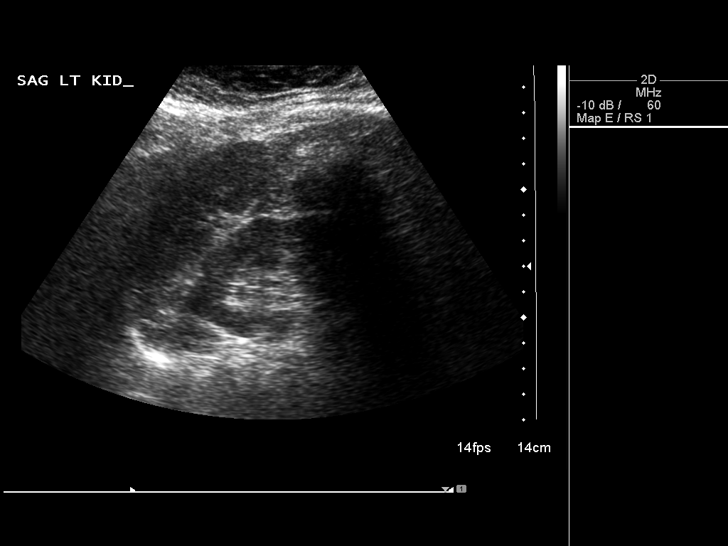
[im 46/55]
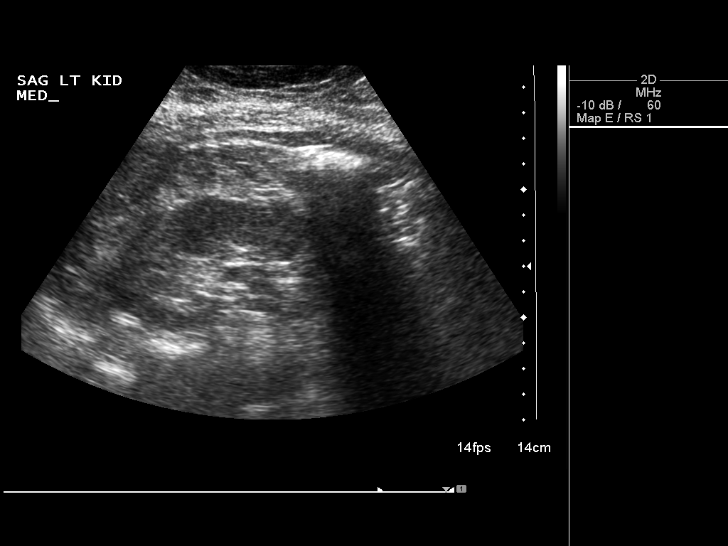
[im 50/55]
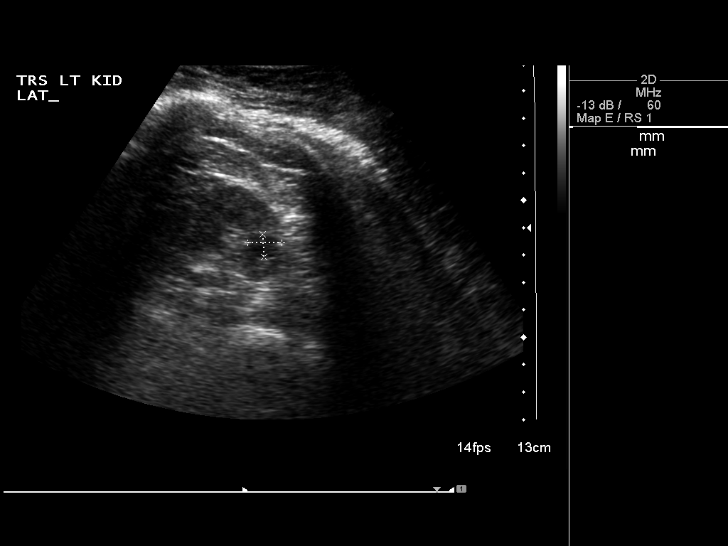
[im 55/55]
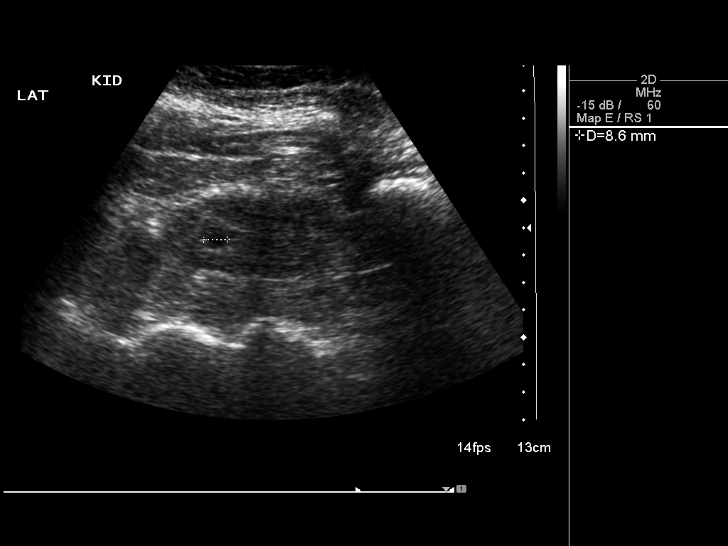

[13 of 25 positions shown; findings below may reference images not displayed]

FINDINGS: Cholecystectomy. The extent of biliary ductal dilatation appears
decreased. The common duct measures approximately 9 mm in the region of the
porta hepatus (normal up to 10-11 mm after cholecystectomy). Approximately
cm at same level on prior CT. No definite intrahepatic biliary ductal
dilatation. No pancreatic ductal dilatation. Pancreatic tail poorly visualized.

Liver and IVC within normal limits.

Echogenic focus in the region of the splenic hilum is likely related to gas
filled bowel, when correlated with the CT of 01/24/2007.

Right kidney 8.9 cm. Left kidney 9.8 cm. Mild renal cortical atrophy primarily
in the right. Left-sided renal cysts.

Abdominal aorta non aneurysmal without ascites.

IMPRESSION

1. Cholecystectomy with common duct now at the upper of normal for size. Given
the history of elevated liver function tests, and the appearance of the dilated
intra and extrahepatic ducts on the CT of 10 days ago, further evaluation with
MRCP may be informative. Biliary dyskinesia or a area of ampullary stenosis
could have this appearance.
2. Mild right renal cortical atrophy.

## 2008-11-25 ENCOUNTER — Encounter: Admission: RE | Admit: 2008-11-25 | Discharge: 2008-11-25 | Payer: Self-pay | Admitting: Internal Medicine

## 2008-12-07 ENCOUNTER — Encounter: Admission: RE | Admit: 2008-12-07 | Discharge: 2008-12-07 | Payer: Self-pay | Admitting: Internal Medicine

## 2009-01-31 IMAGING — CR DG KNEE 1-2V PORT*L*
2 series · 2 of 2 positions shown · non-contrast
Comparison: None.

CLINICAL DATA: Left total knee arthroplasty.
 PORTABLE LEFT KNEE ? 2 VIEW ? 04/29/07:

[view not recorded (1 of 2)]
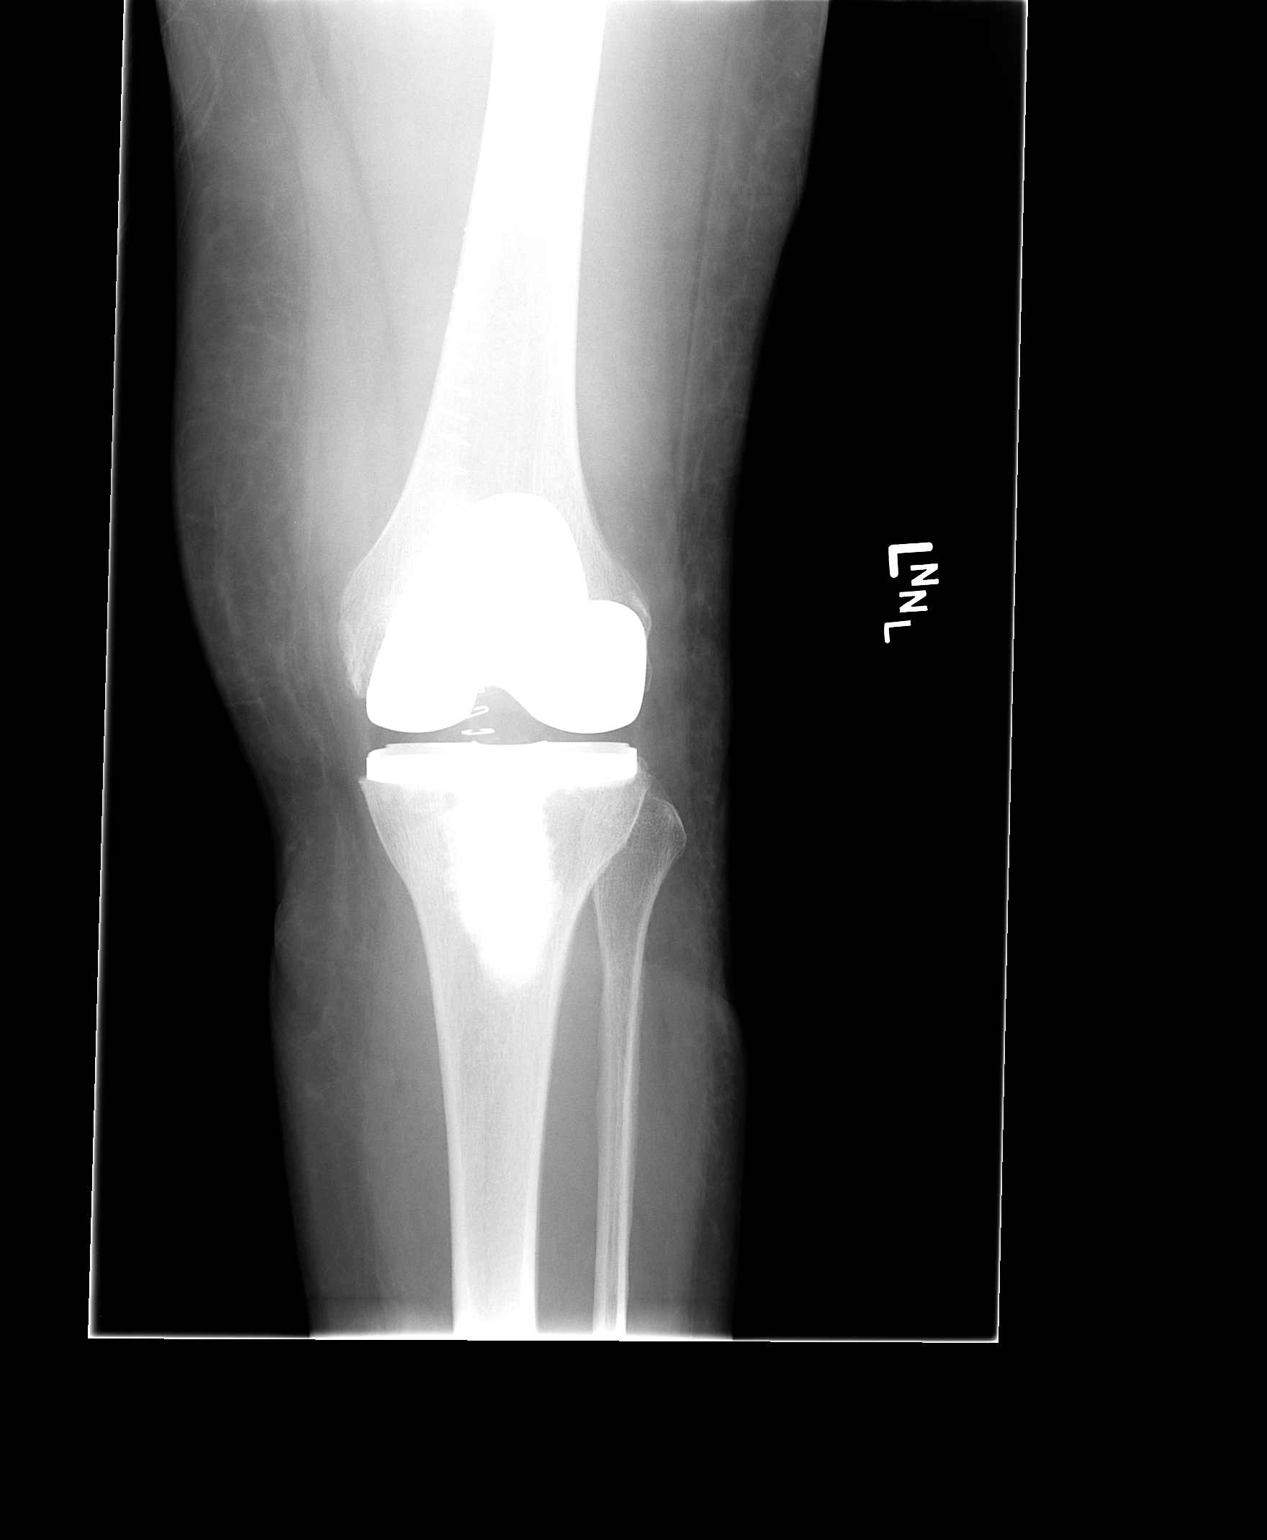

[view not recorded (2 of 2)]
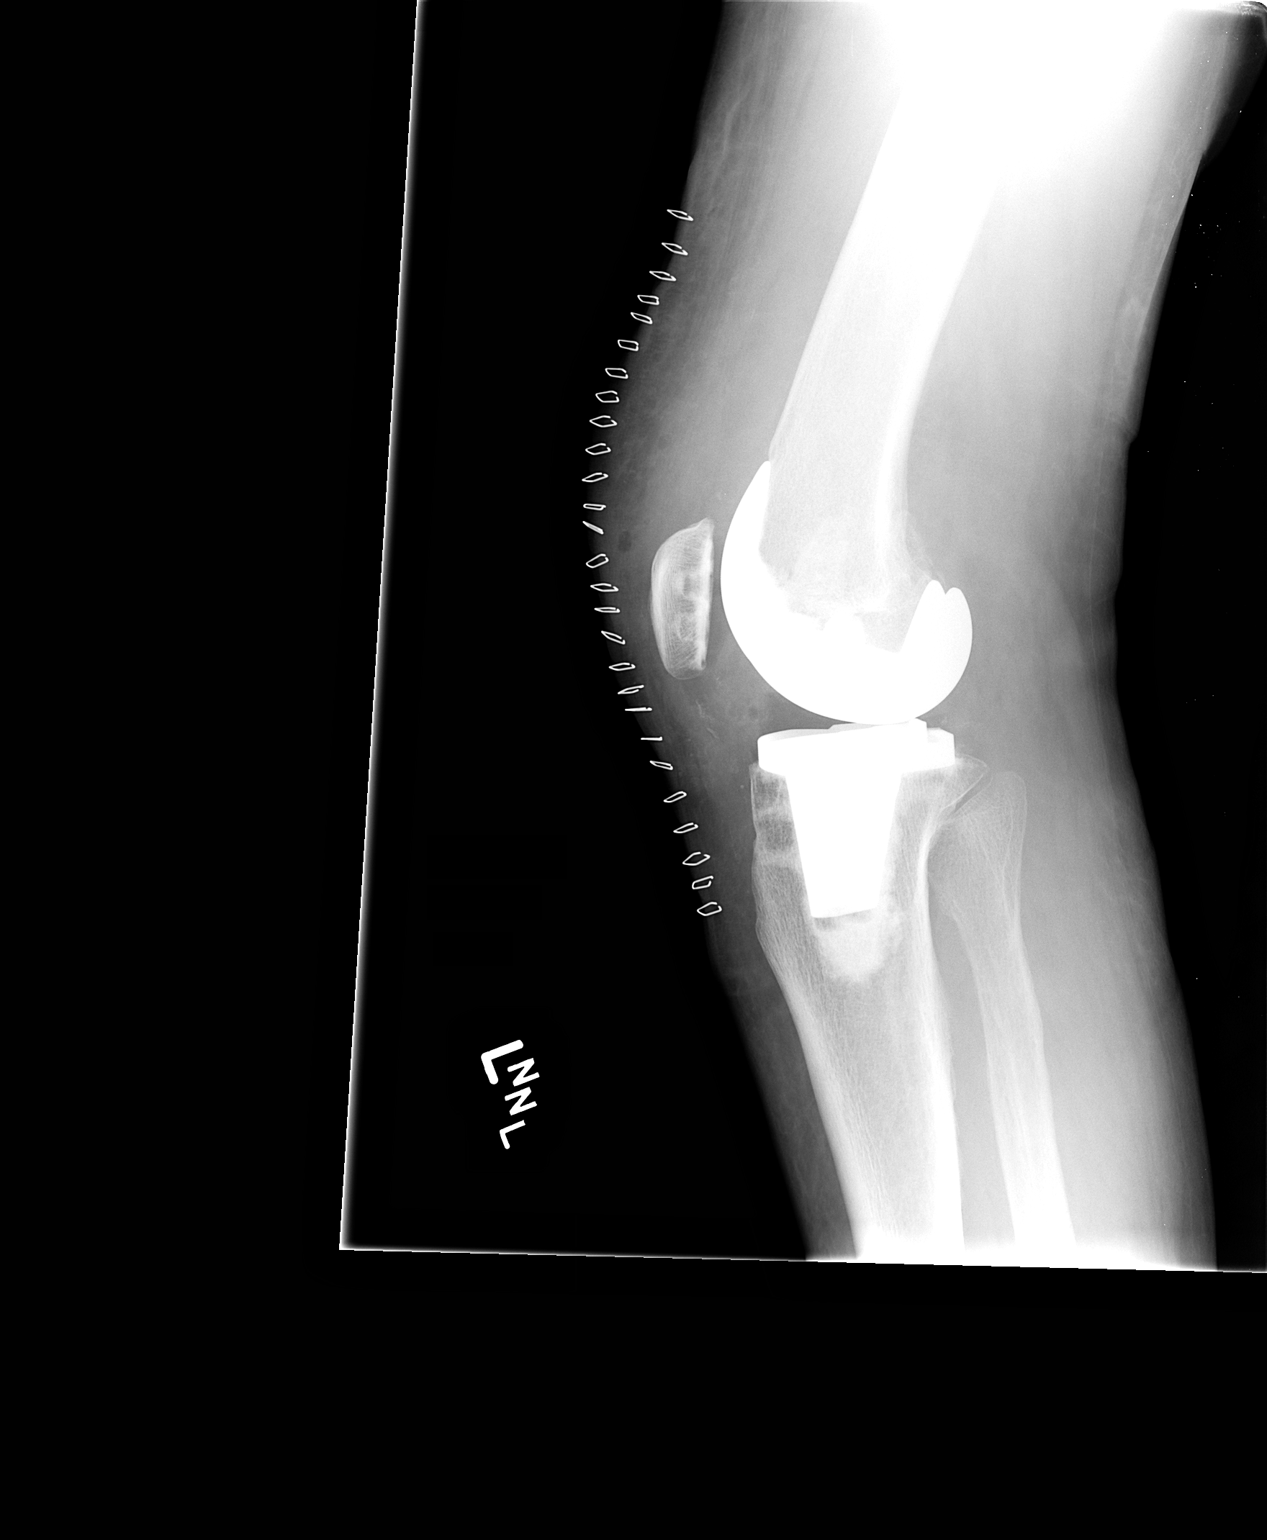

[2 of 2 positions shown; findings below may reference images not displayed]

FINDINGS: The patient is status post left total knee arthroplasty.  Subcutaneous and joint air is noted.  Joint effusion.
IMPRESSION: Left total knee arthroplasty without immediate complications.

## 2009-04-15 IMAGING — CR DG WRIST COMPLETE 3+V*R*
1 series · 4 of 4 positions shown · non-contrast
Comparison: none

REASON FOR EXAM: injury, pain, swelling
COMMENTS:   LMP: Post-Menopausal

PROCEDURE:     DXR - DXR WRIST RT COMP WITH OBLIQUES  - July 12, 2007 [DATE]
RESULT:     Comparison: No available comparison exam.

[Series 1: view not recorded · 0.17mm/px · 4 of 4 slices shown]
[im 1/4]
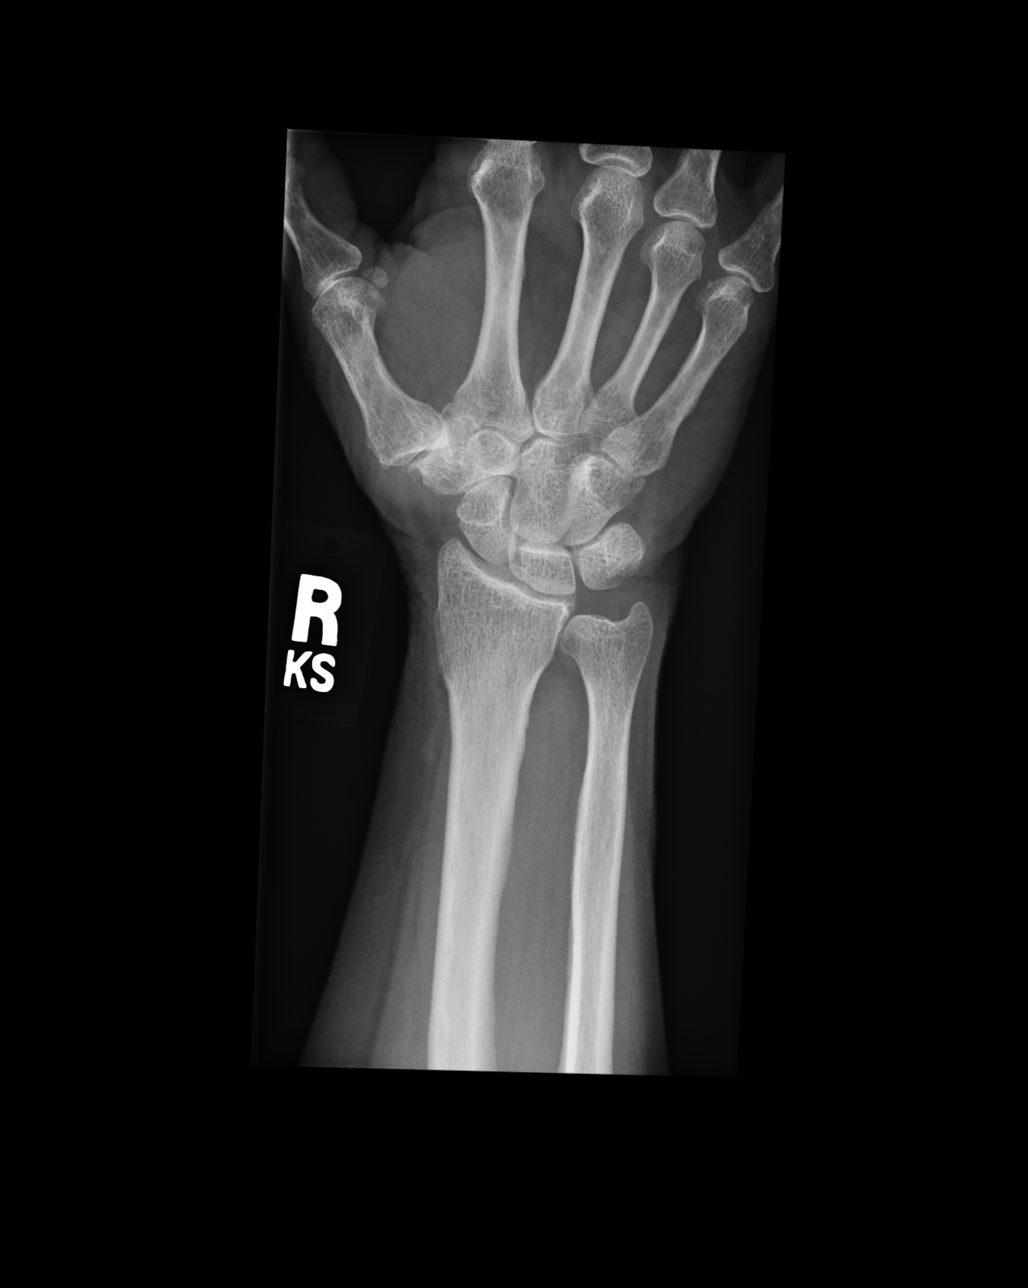
[im 2/4]
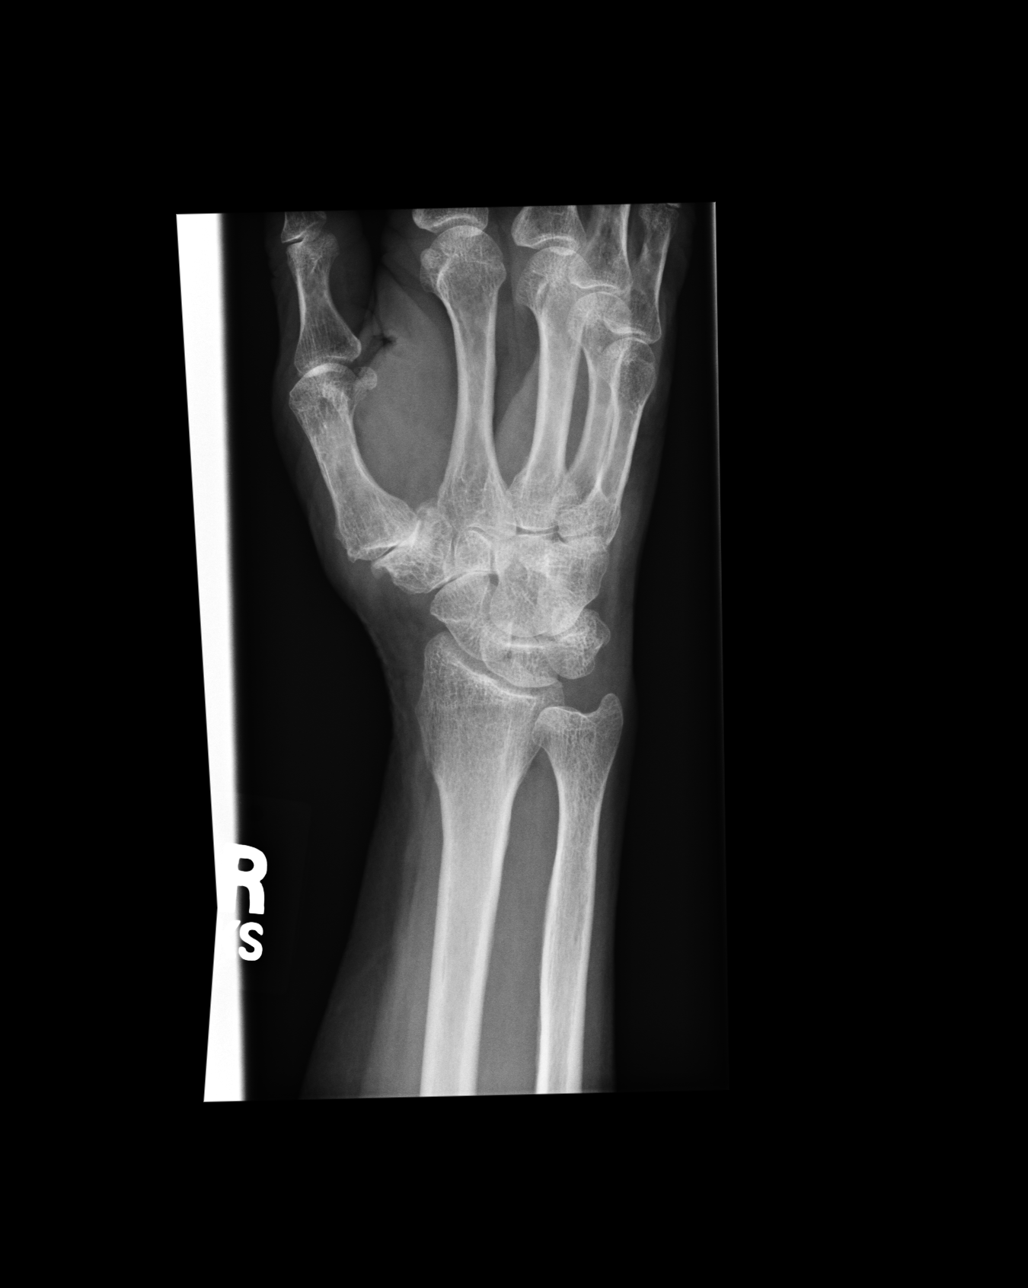
[im 3/4]
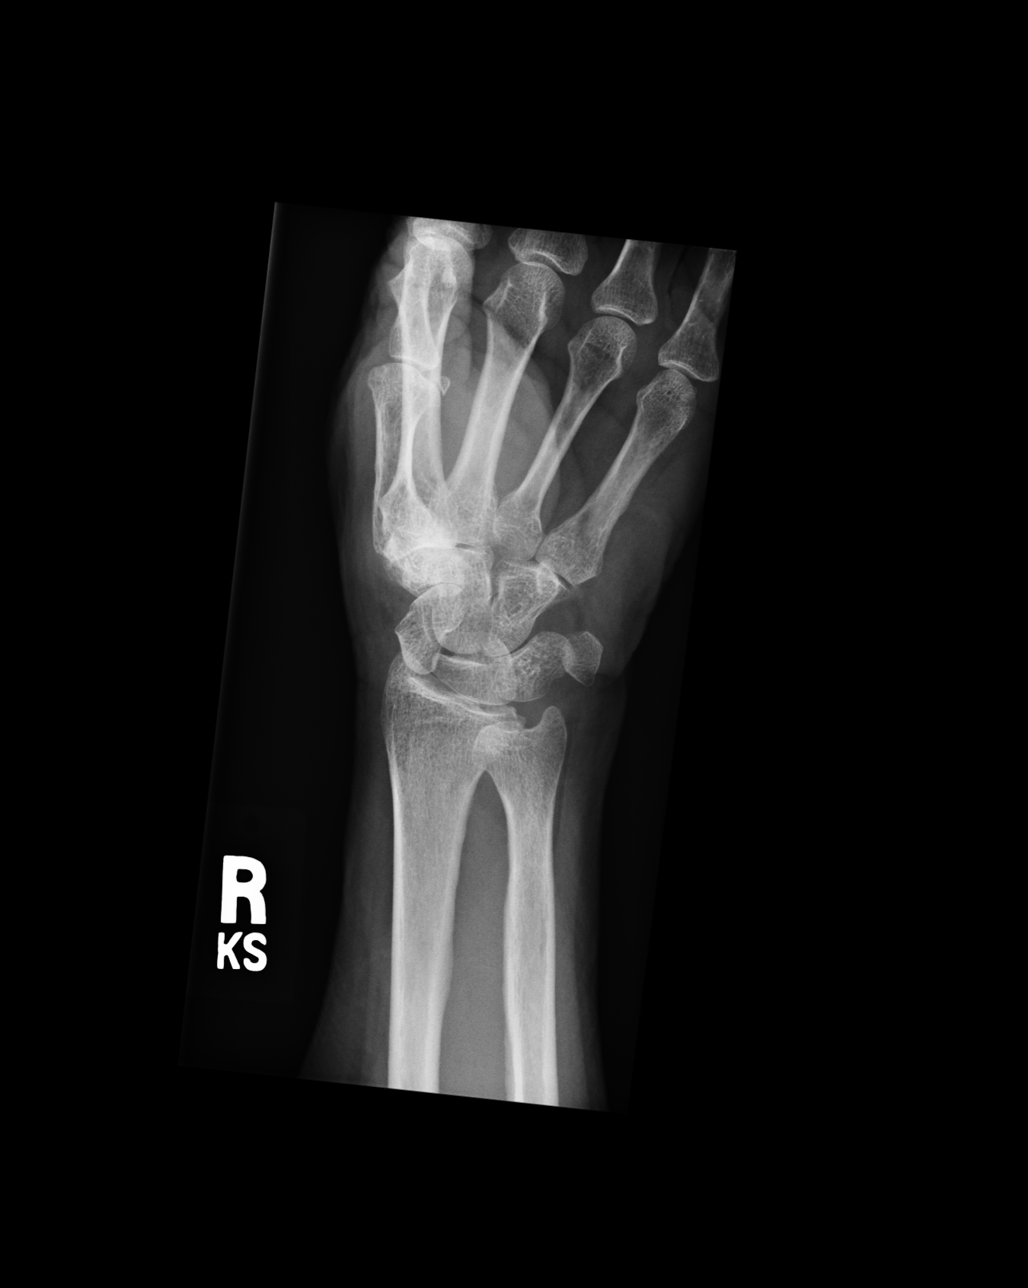
[im 4/4]
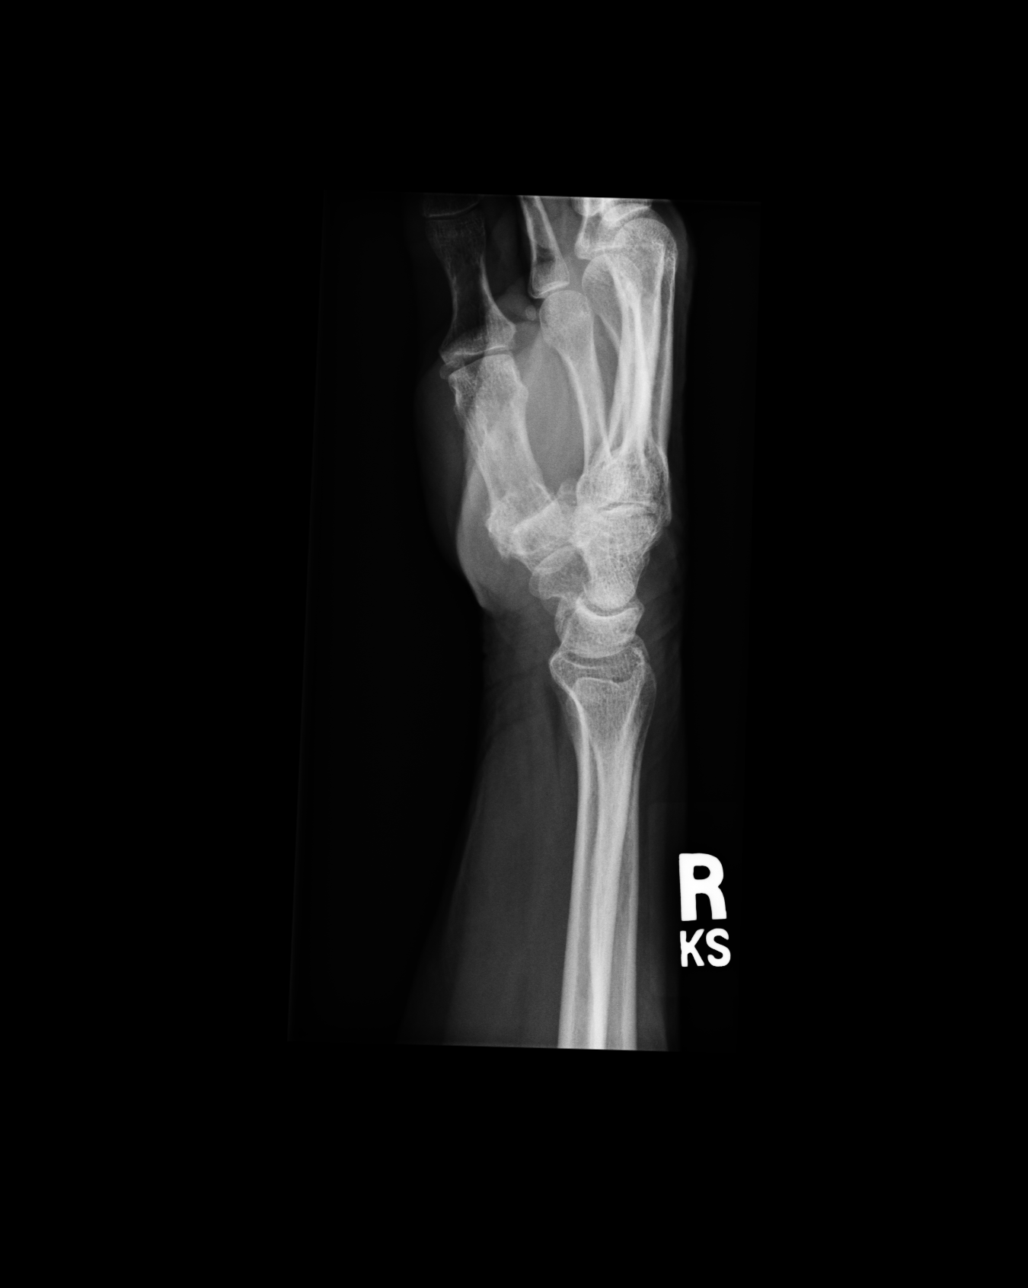

[4 of 4 positions shown; findings below may reference images not displayed]

FINDINGS: Four views of the right wrist were obtained.

No fracture or dislocation of the right wrist is noted. Mild degenerative
changes are seen involving the first carpometacarpal joint.
IMPRESSION: 1. No fracture or dislocation of the right wrist is noted. If there is
clinical concern for an occult scaphoid fracture, consider followup
radiographs in 10 to 14 days.

## 2009-06-18 ENCOUNTER — Emergency Department (HOSPITAL_COMMUNITY): Admission: EM | Admit: 2009-06-18 | Discharge: 2009-06-18 | Payer: Self-pay | Admitting: Emergency Medicine

## 2009-08-18 IMAGING — CR DG KNEE COMPLETE 4+V*L*
4 series · 4 of 4 positions shown · non-contrast
Comparison: 04/29/2007.

CLINICAL DATA: 67-year-old female status post knee dislocation.
Pain.

LEFT KNEE - COMPLETE 4+ VIEW

[t knee ap left]
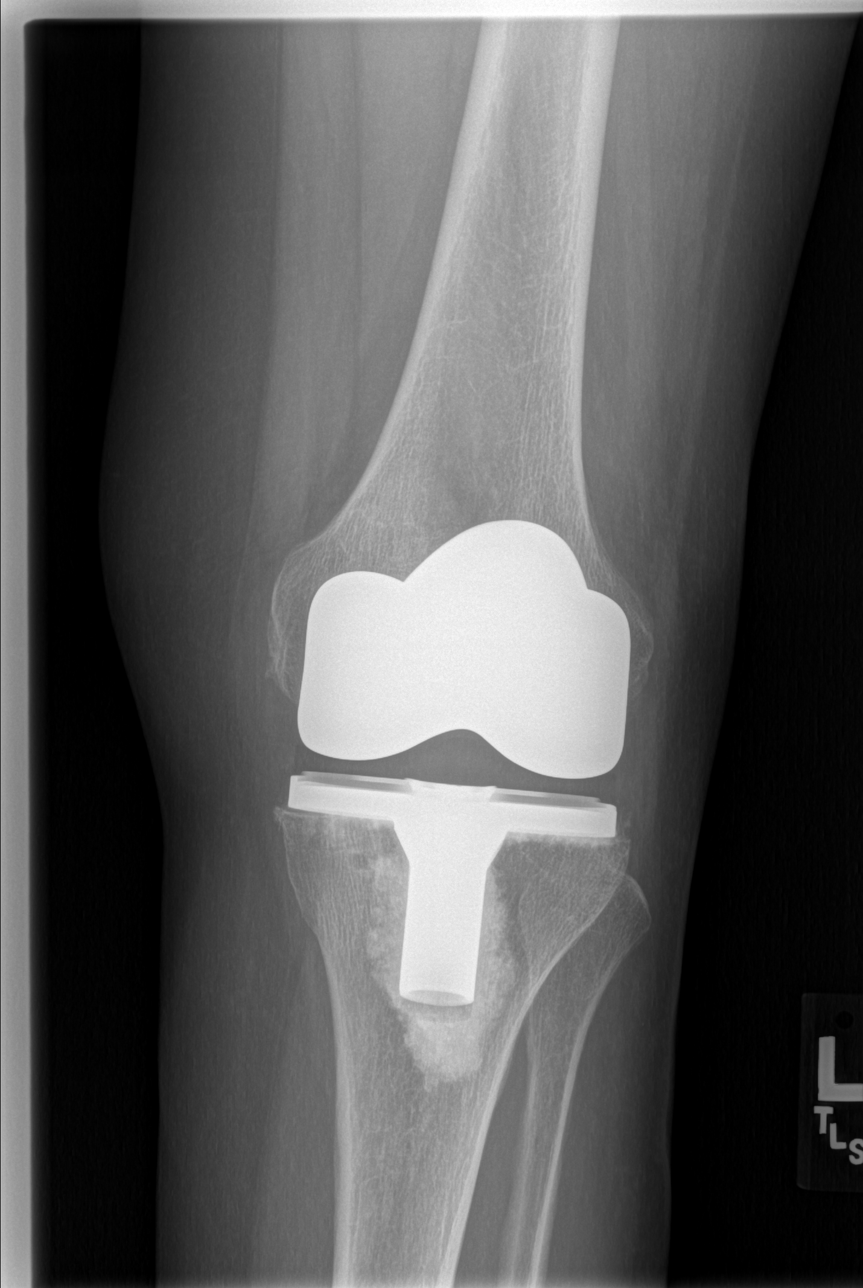

[t knee oblique left (1 of 2)]
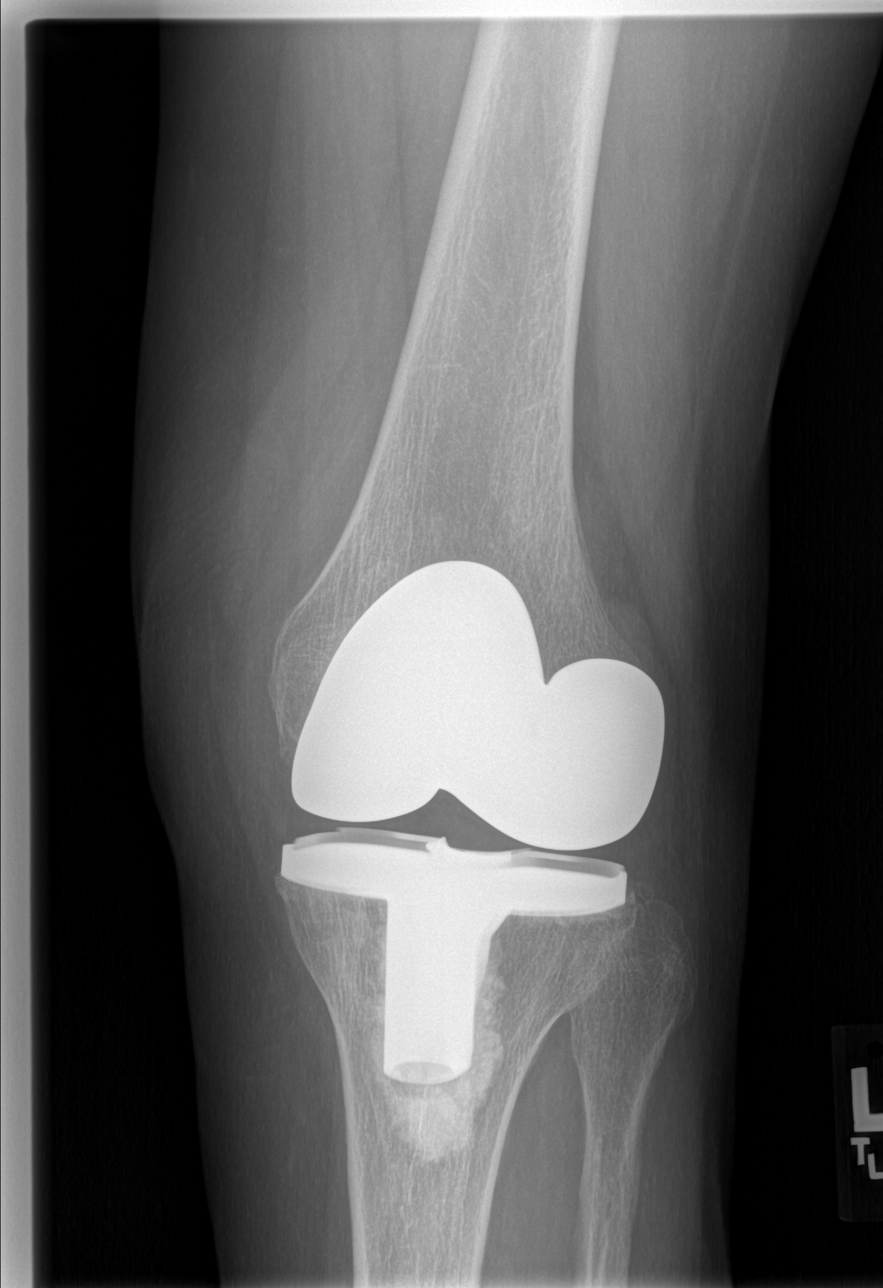

[t knee oblique left (2 of 2)]
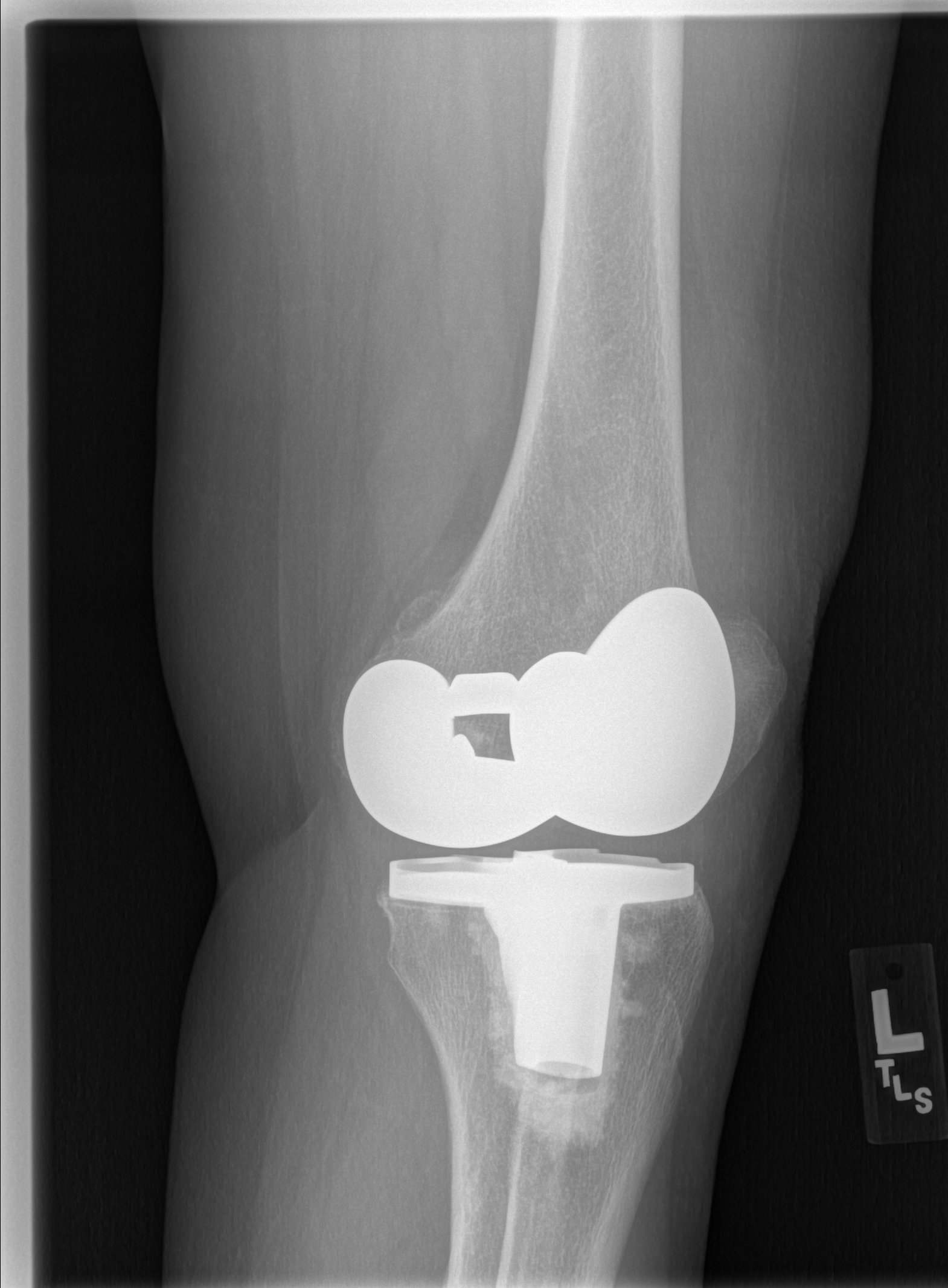

[t knee lat left]
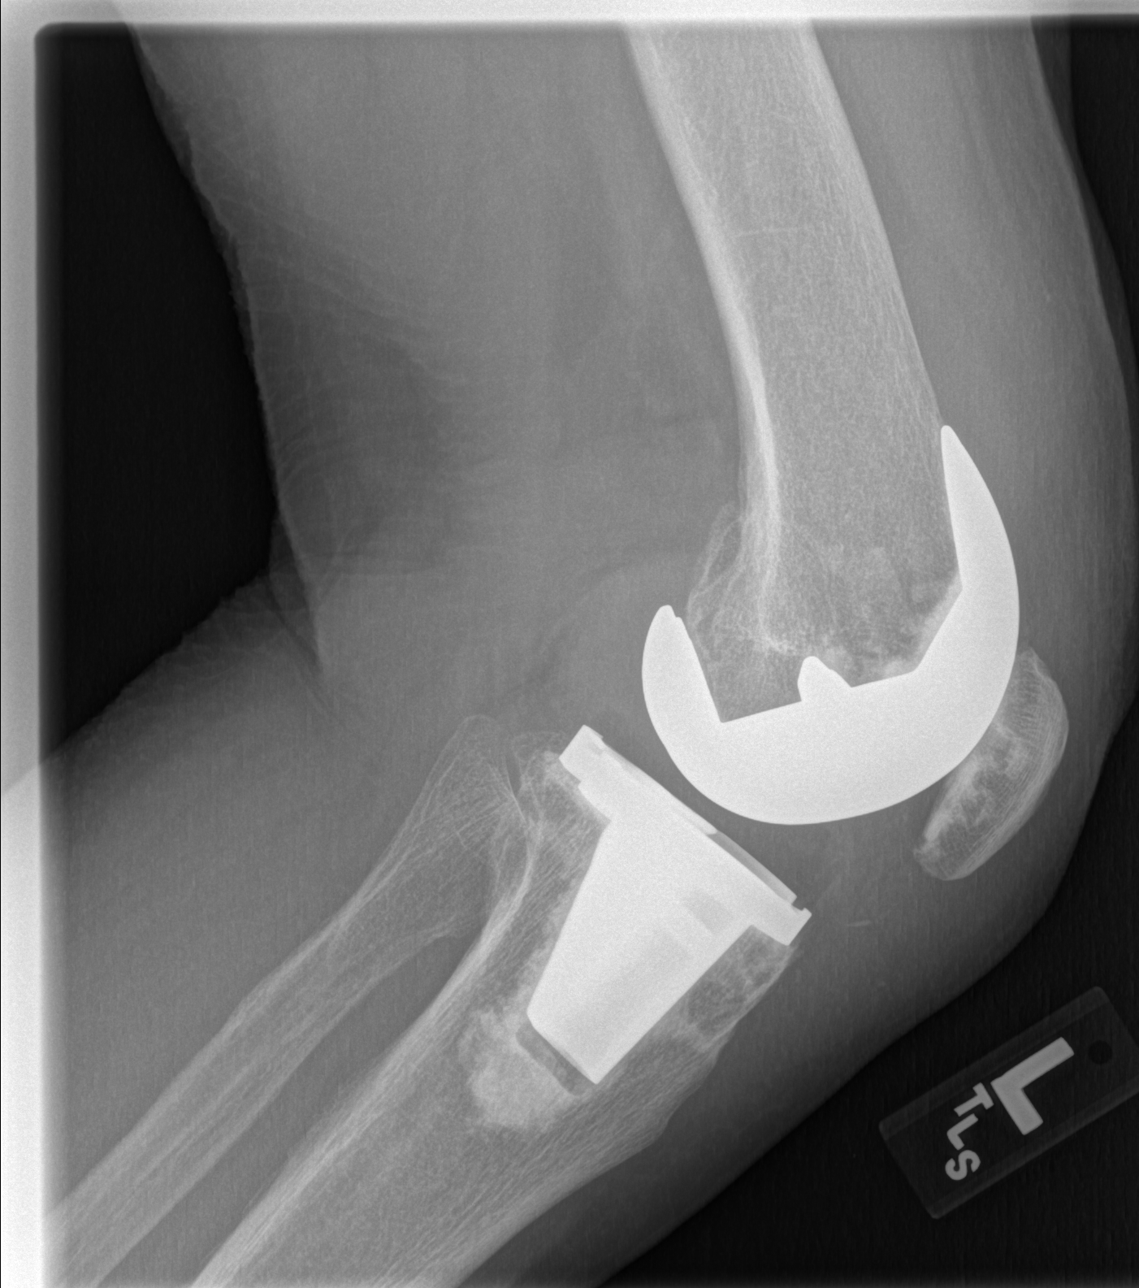

[4 of 4 positions shown; findings below may reference images not displayed]

FINDINGS: The patient is status post left total knee arthroplasty.
The knee is located.  A moderate sized joint effusion is seen.
There is no evidence for acute fracture.
IMPRESSION: 1.  Moderate sized joint effusion without underlying fracture
dislocation.
2.  Status post total knee arthroplasty without complication.

## 2009-08-21 IMAGING — CR DG STERNUM 2+V
1 series · 1 of 1 positions shown · non-contrast
Comparison: Two-view chest x-ray 07/23/2006

CLINICAL DATA: Fell off horse - sternal pain

STERNUM - 2+ VIEW

[view not recorded]
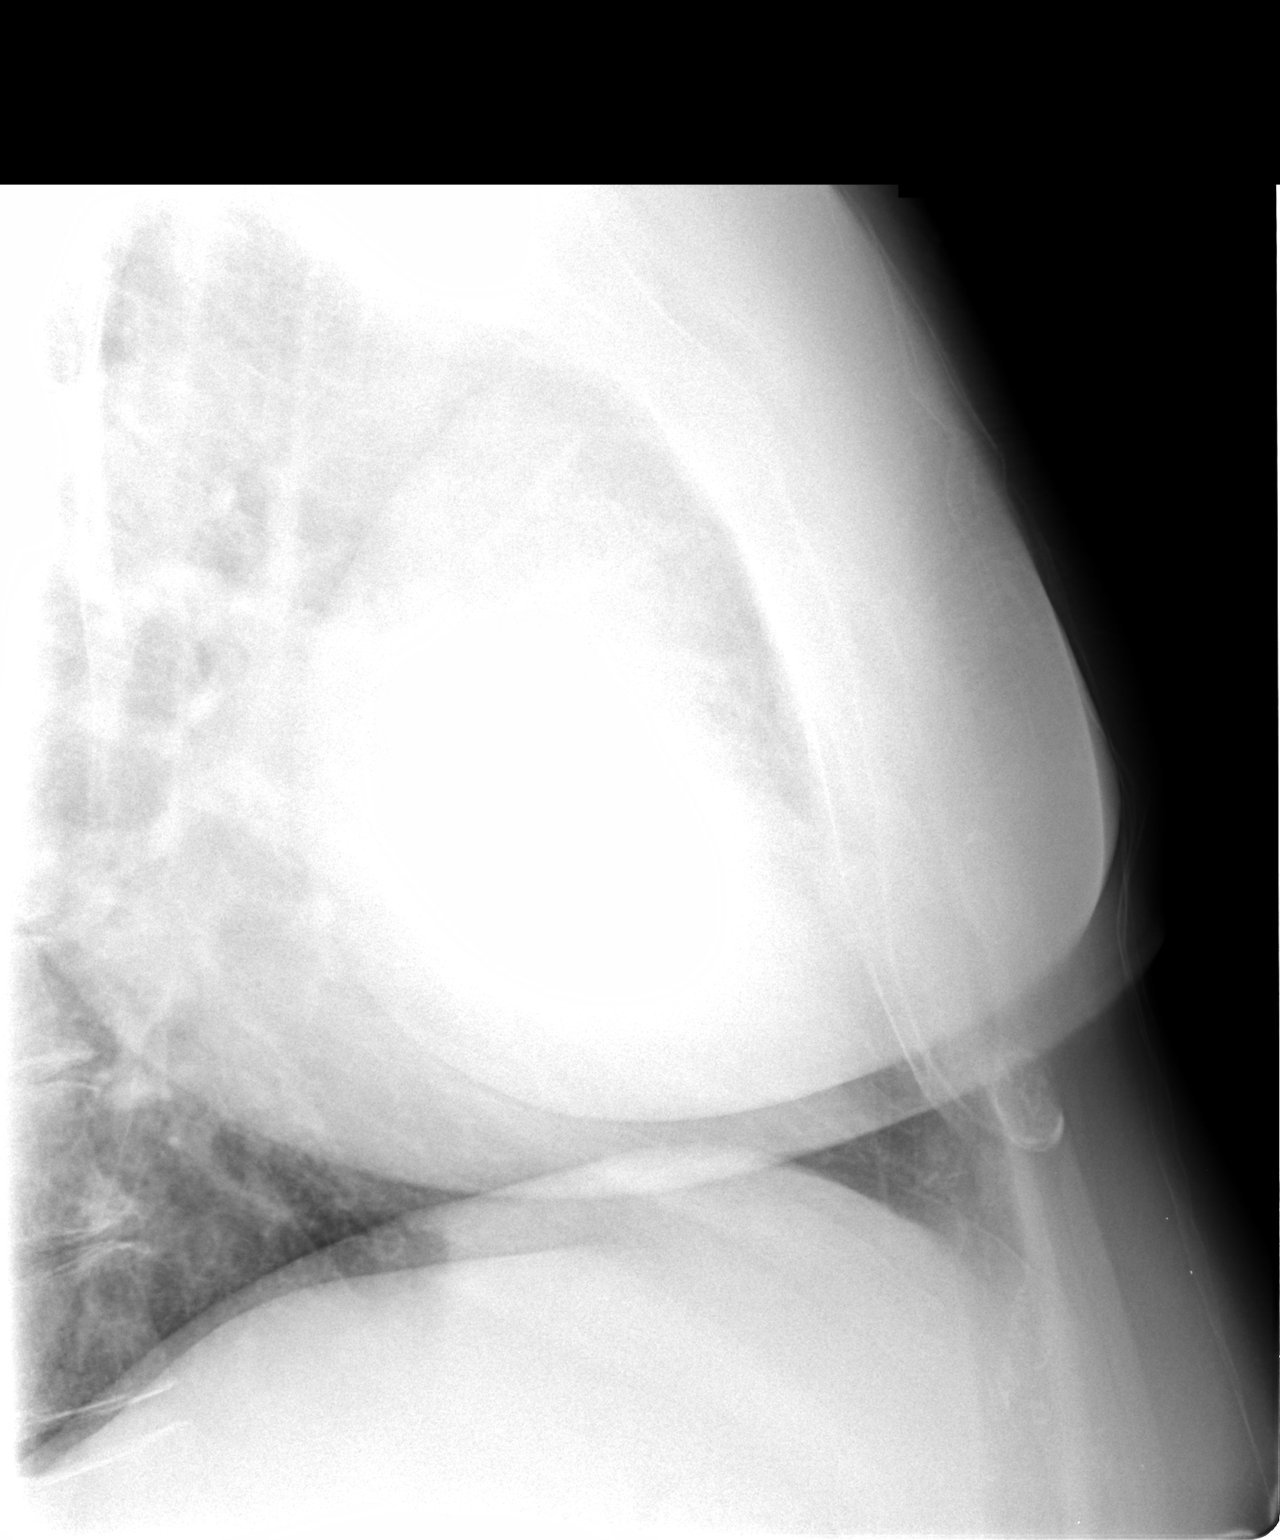

[1 of 1 positions shown; findings below may reference images not displayed]

FINDINGS: Obvious sternal fracture.  Pre and retrosternal soft
tissues normal.
IMPRESSION: No acute findings.

## 2009-08-21 IMAGING — CR DG LUMBAR SPINE COMPLETE 4+V
5 series · 5 of 5 positions shown · non-contrast
Comparison: None

CLINICAL DATA: Fell off horse.  Low back and coccygeal pain

LUMBAR SPINE - COMPLETE 4+ VIEW

[t l-spine a.p.]
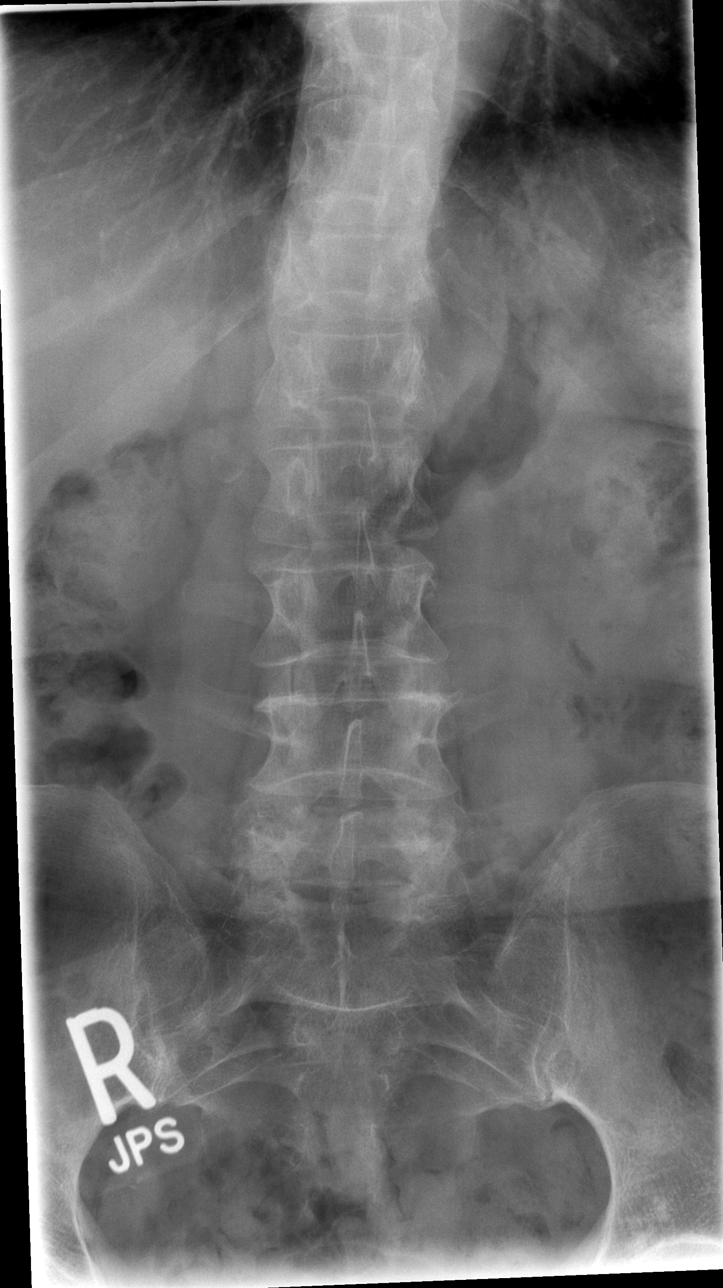

[t l-spine oblique exposure (1 of 2)]
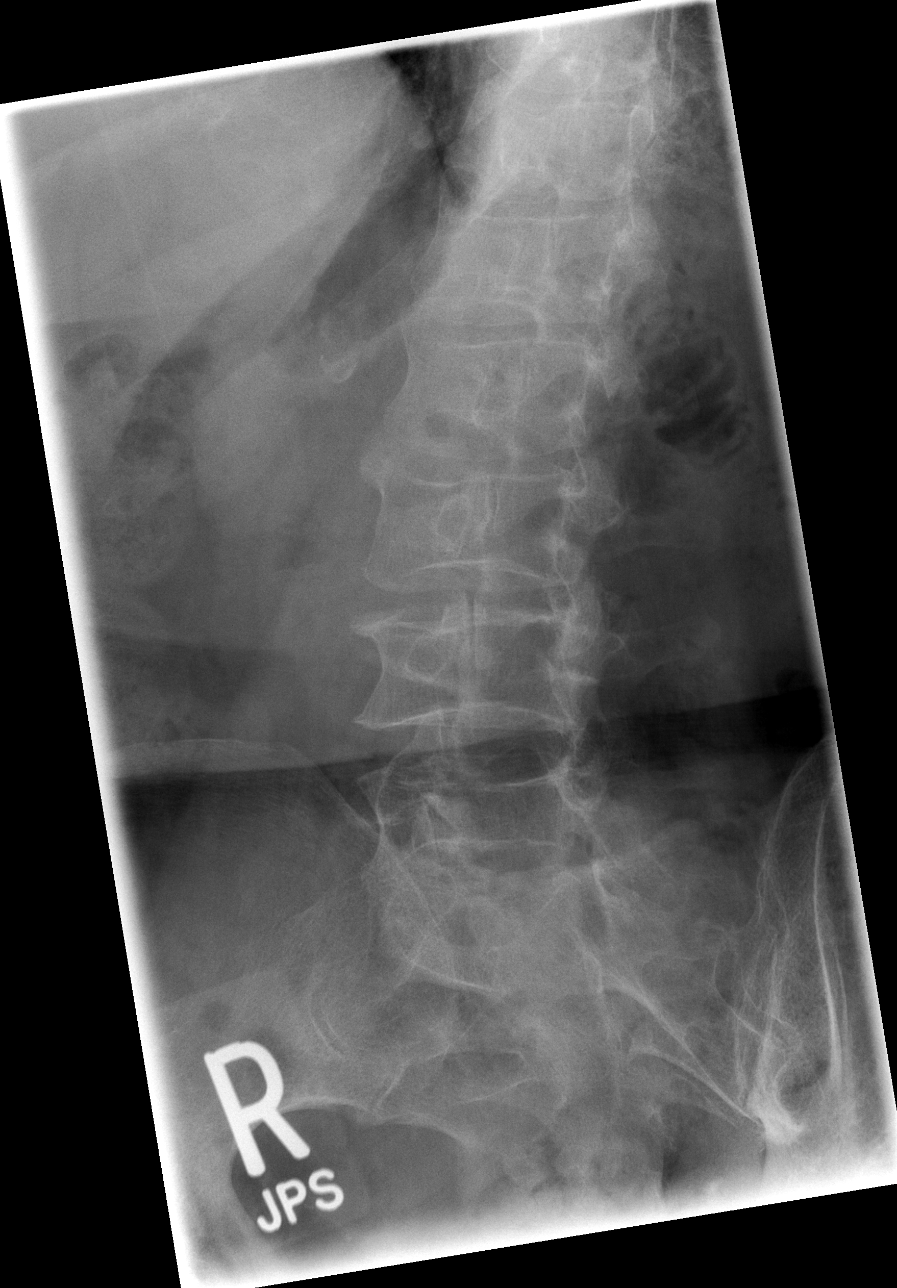

[t l-spine oblique exposure (2 of 2)]
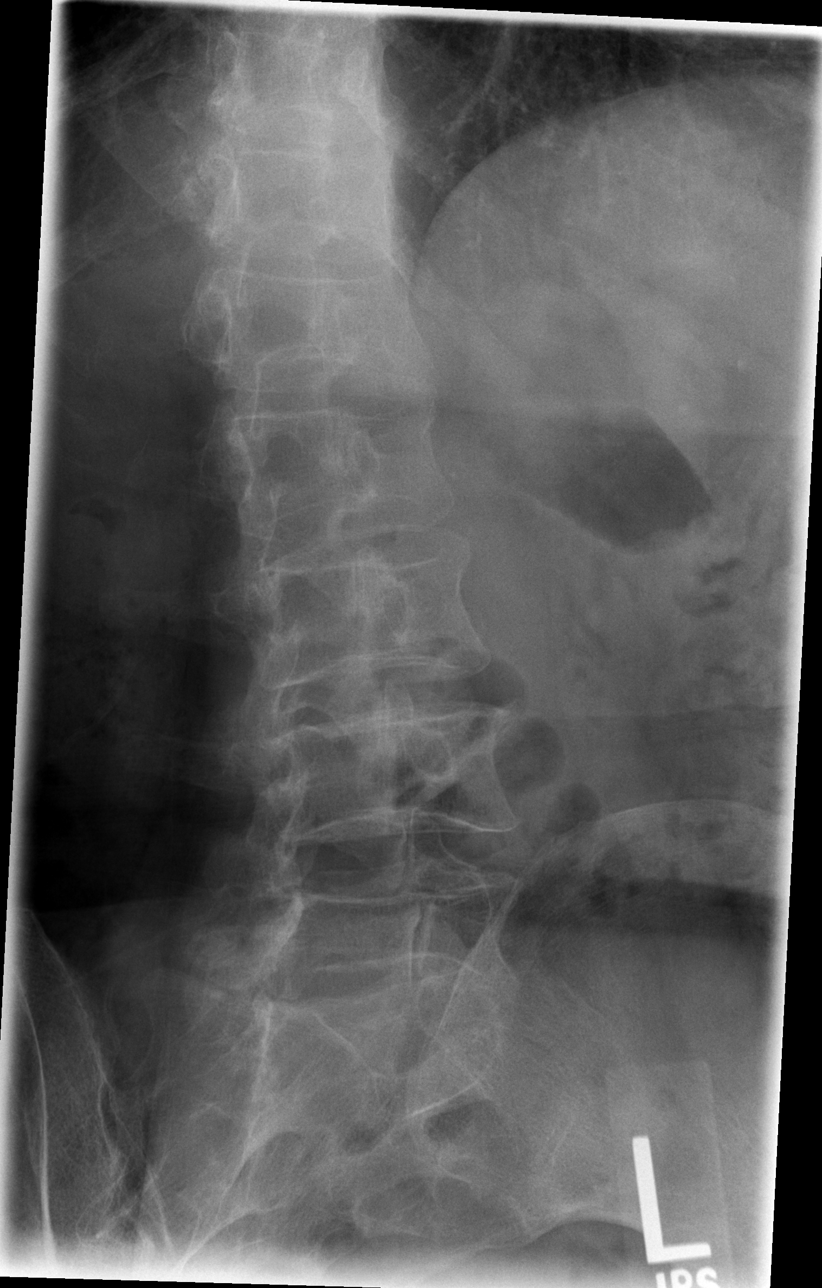

[t l-spine lat]
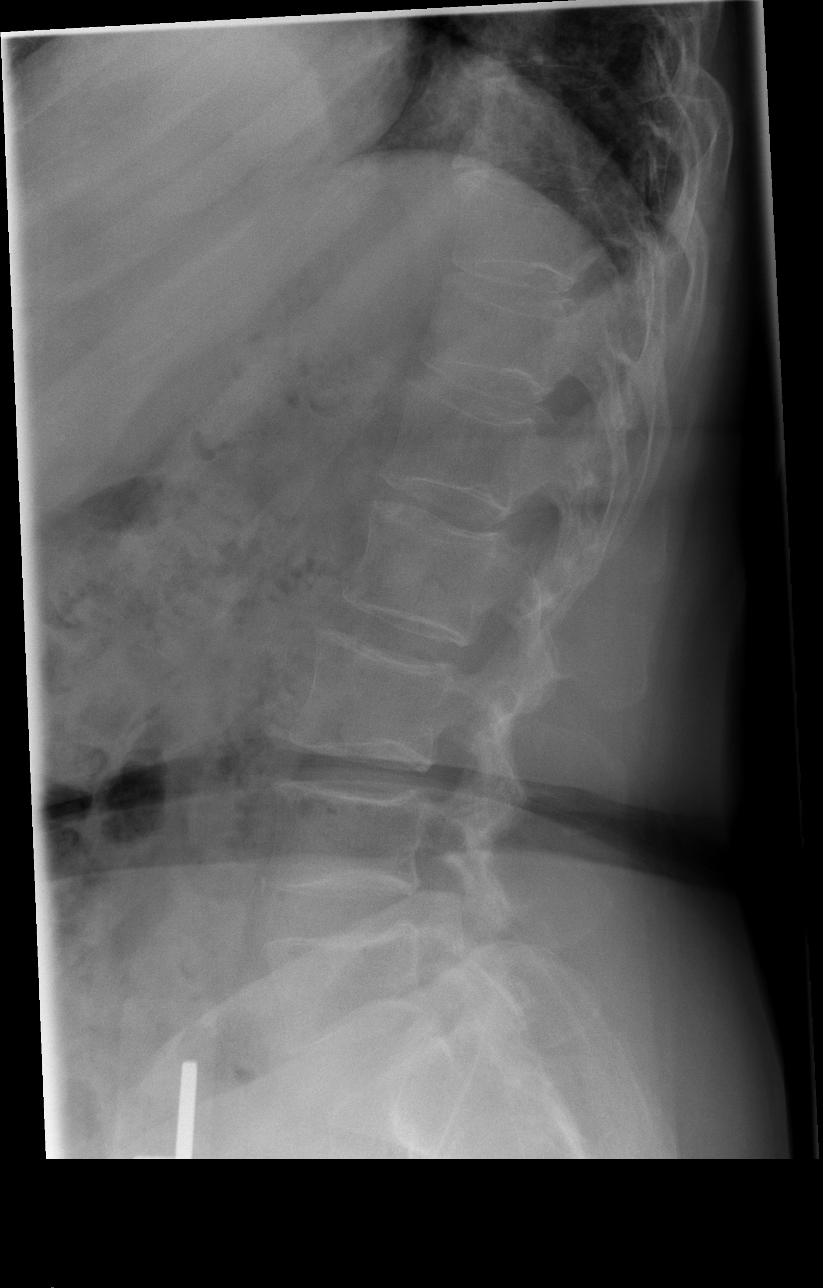

[t l-spine l5-s1 spot]
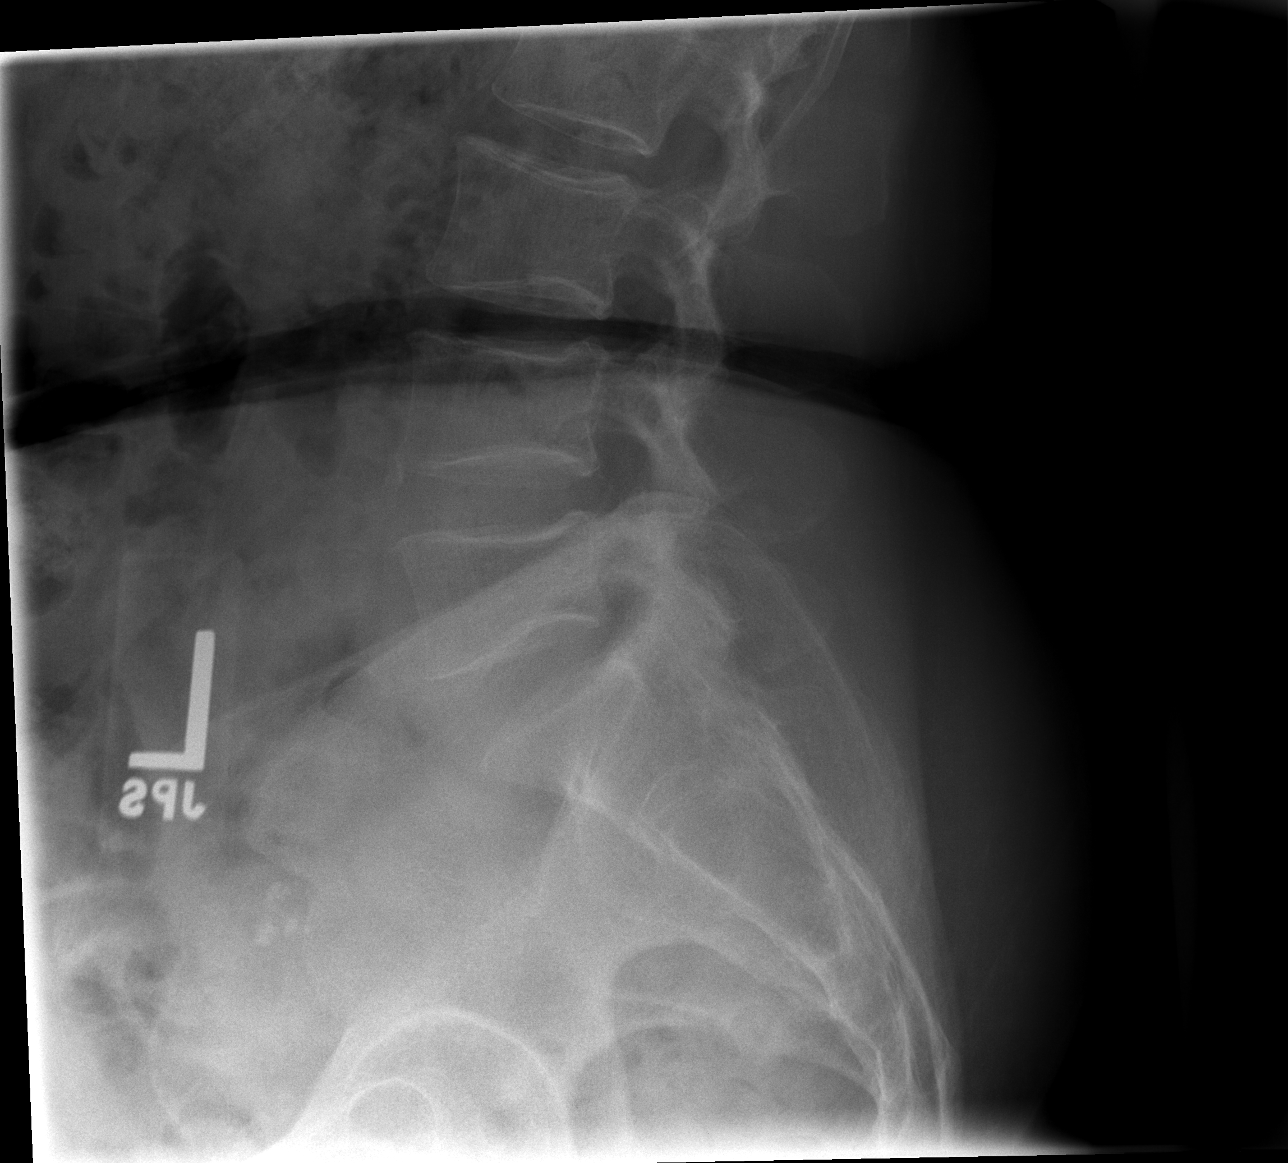

[5 of 5 positions shown; findings below may reference images not displayed]

FINDINGS: Four view exam of the lumbar spine shows no evidence for
an acute or chronic fracture.  Bones are diffusely demineralized.
There is some mild facet osteoarthritis on the mid left facet
joints.

9 mm circular calcification is seen in the medial right abdomen, at
the expected location of the right renal hilum.  Review of the CT
scan from 01/24/2007 shows a calcified right renal artery aneurysm
or dislocation.
IMPRESSION: No acute bony abnormality in the lumbar spine.

Right renal artery aneurysm.

## 2010-01-03 IMAGING — CT CT ABDOMEN W/ CM
2 of 5 series · 17 of 46 positions shown, 19 images · IV contrast (agent unspecified)
Comparison: 01/24/2007

CT ABDOMEN

CLINICAL DATA: Abdominal pain.  Nausea.

CT ABDOMEN AND PELVIS WITH CONTRAST
TECHNIQUE: Multidetector CT imaging of the abdomen and pelvis was
performed using the standard protocol following bolus
administration of intravenous contrast.
Contrast: 100 ml [id number]-DQQ.

[Series 2: routine abdomen · axial · 0.85mm/px · z∈[-473,-68]mm · 14 of 93 slices shown, 16 images]
[im 6/93  soft-tissue]
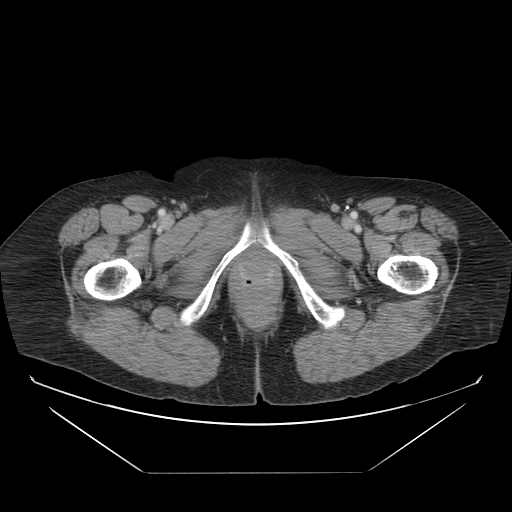
[im 6/93  bone]
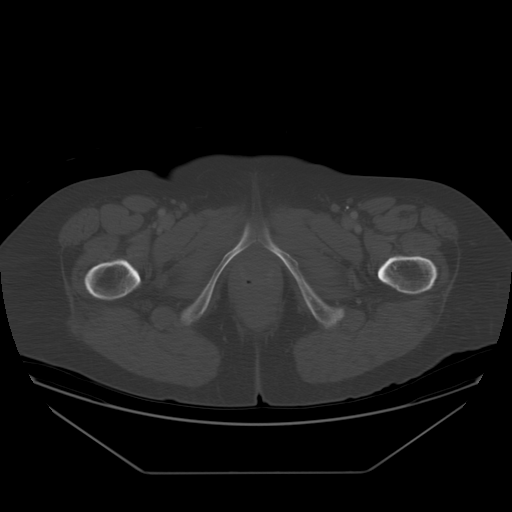
[im 11/93  soft-tissue]
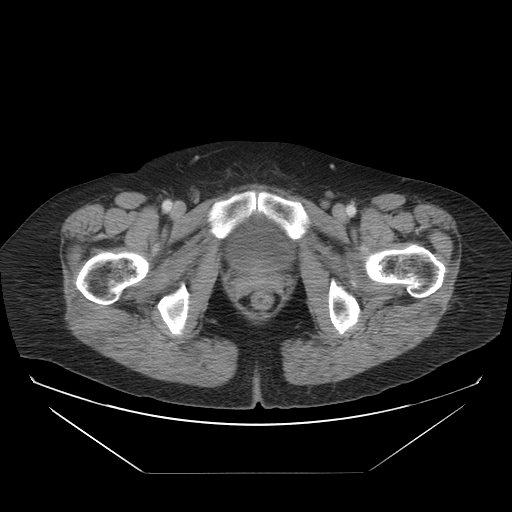
[im 21/93  soft-tissue]
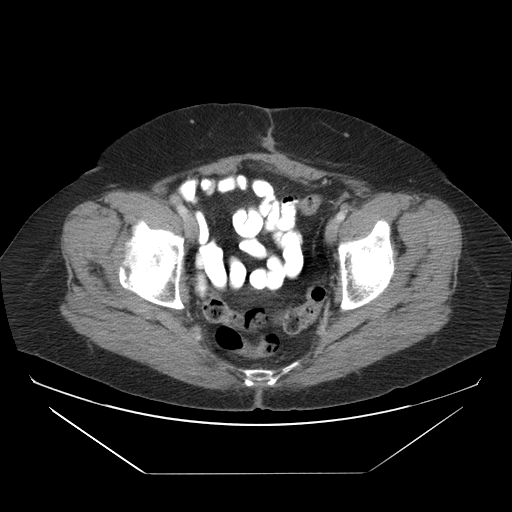
[im 26/93  soft-tissue]
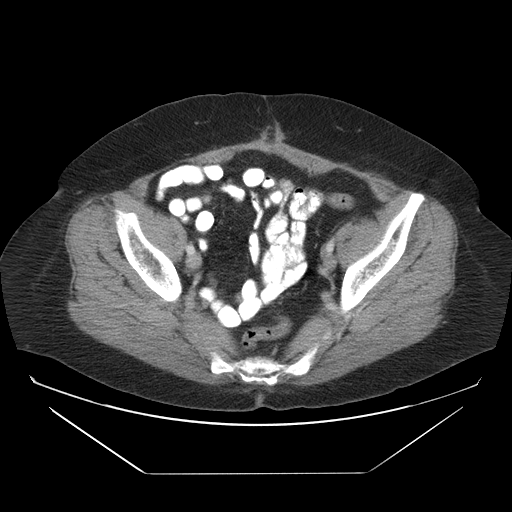
[im 31/93  soft-tissue]
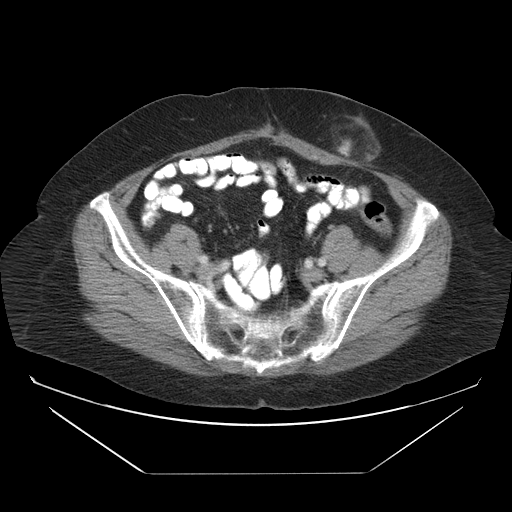
[im 36/93  soft-tissue]
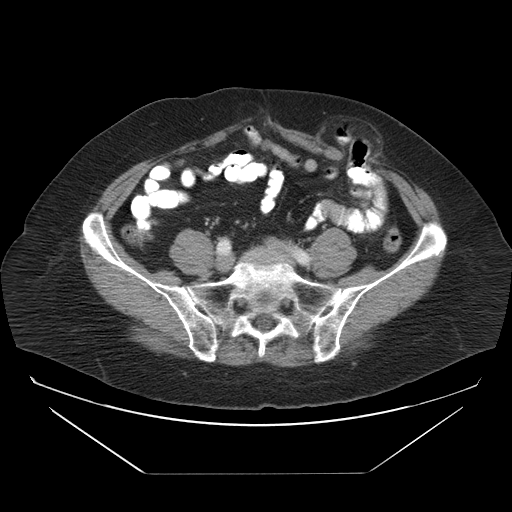
[im 41/93  soft-tissue]
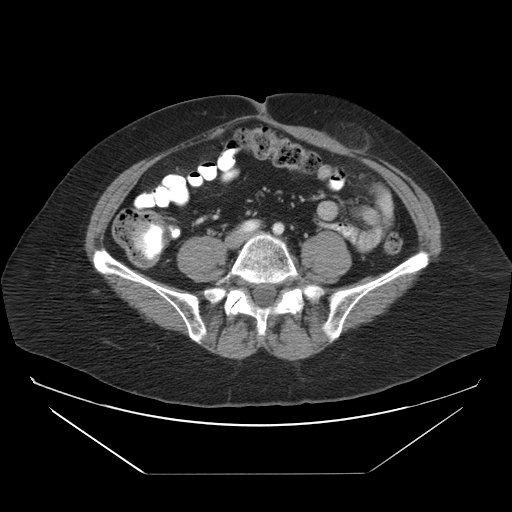
[im 52/93  soft-tissue]
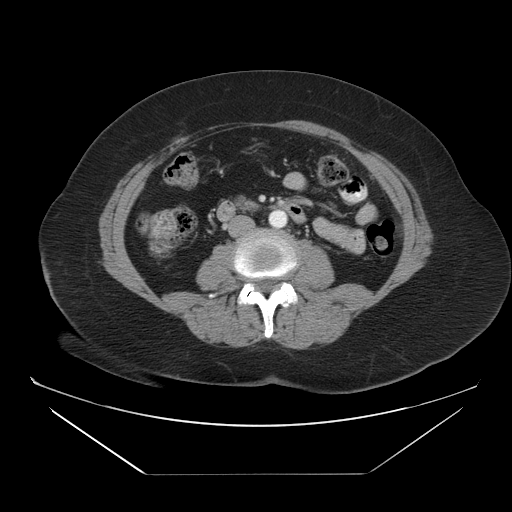
[im 57/93  soft-tissue]
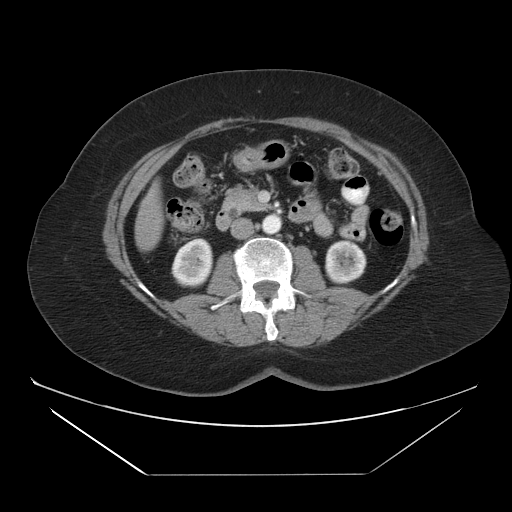
[im 57/93  bone]
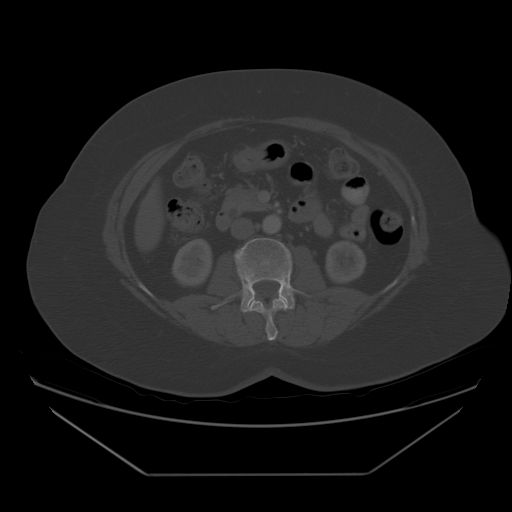
[im 62/93  soft-tissue]
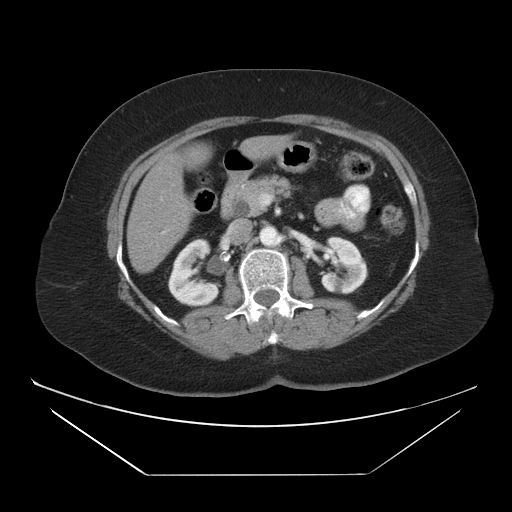
[im 67/93  soft-tissue]
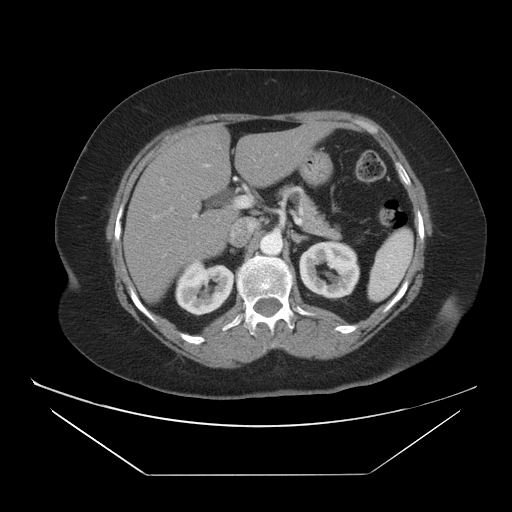
[im 72/93  soft-tissue]
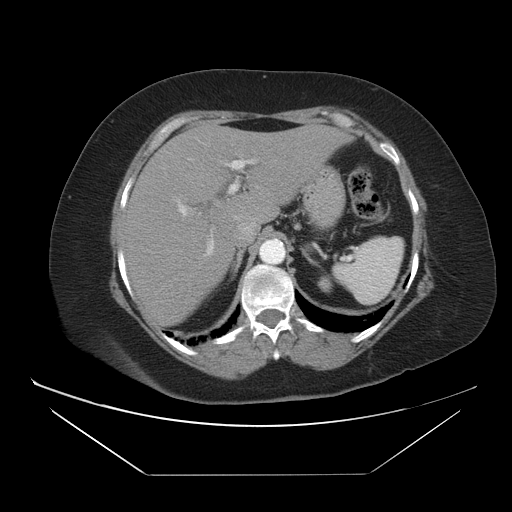
[im 82/93  soft-tissue]
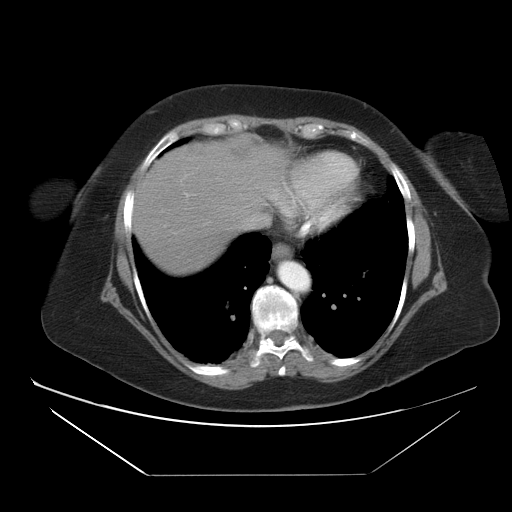
[im 87/93  soft-tissue]
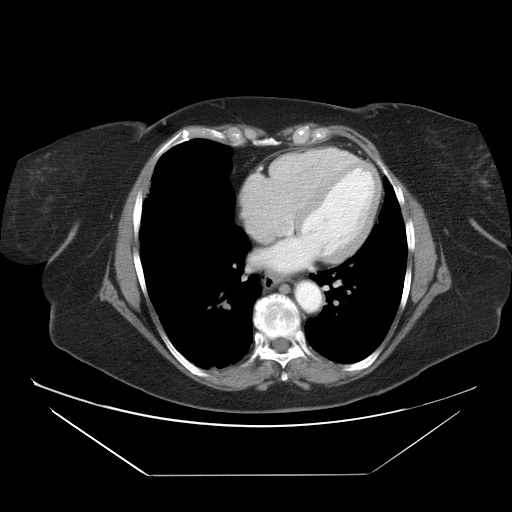

[Series 401: reformatted · coronal · 0.90mm/px · 3 of 99 slices shown]
[im 33/99  soft-tissue]
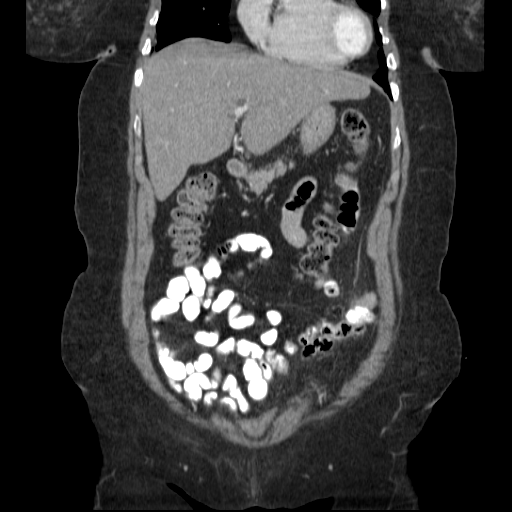
[im 44/99  soft-tissue]
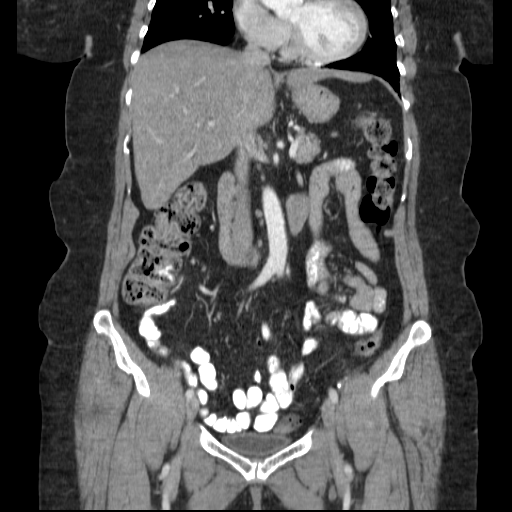
[im 55/99  soft-tissue]
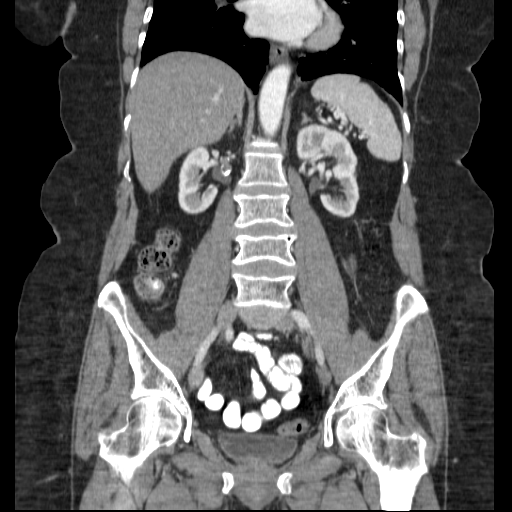

[17 of 46 positions shown; findings below may reference images not displayed]

FINDINGS: Dependent atelectasis of the lung bases.  Coronary
artery atherosclerosis.  Liver within normal limits.  Gallbladder
absent.  Post cholecystectomy dilation of the common bile duct.
Pancreas appears within normal limits.  Splenic old granulomatous
disease.  Too small to characterize left interpolar renal low
density lesion, statistically likely a cyst.  Right kidney
unremarkable.  Mild nodular thickening of the left adrenal gland,
probably adenoma.  Small hiatal hernia.
IMPRESSION: 1.  No acute abdominal abnormality.
2.  Cholecystectomy.
3.  Low density lesion in the interpolar right kidney is stable
compared to prior exams, consistent with cyst.
4.  Old granulomatous disease.
5.  Stable left adrenal nodule.
6.  Slight decrease and dilation of common bile duct compared to
01/24/2007.

CT PELVIS
FINDINGS: Right lower quadrant ostomy site is present, right lower
quadrant abdominal wall defect is present, consistent with prior
ostomy site.  A fascial defect is present with focal hernia
containing a loop of small bowel.  No definite obstructive
features.  Clinical evaluation for reducibility recommended.  The
appendix not identified, likely normal.  Moderate amount of stool
in the colon.  Ill-defined fat stranding is present along the
sigmoid colon which appears unchanged compared to prior exam.  No
evidence of diverticulitis.
IMPRESSION: Left lower quadrant hernia containing loop of small bowel.
Recommend clinical assessment for reducibility.  This is at the
site of previous ostomy takedown.

## 2010-01-03 IMAGING — CR DG ABDOMEN ACUTE W/ 1V CHEST
3 series · 3 of 3 positions shown · non-contrast
Comparison: None.

CLINICAL DATA: Abdominal pain, vomiting

ACUTE ABDOMEN SERIES (ABDOMEN 2 VIEW & CHEST 1 VIEW)

[w chest pa]
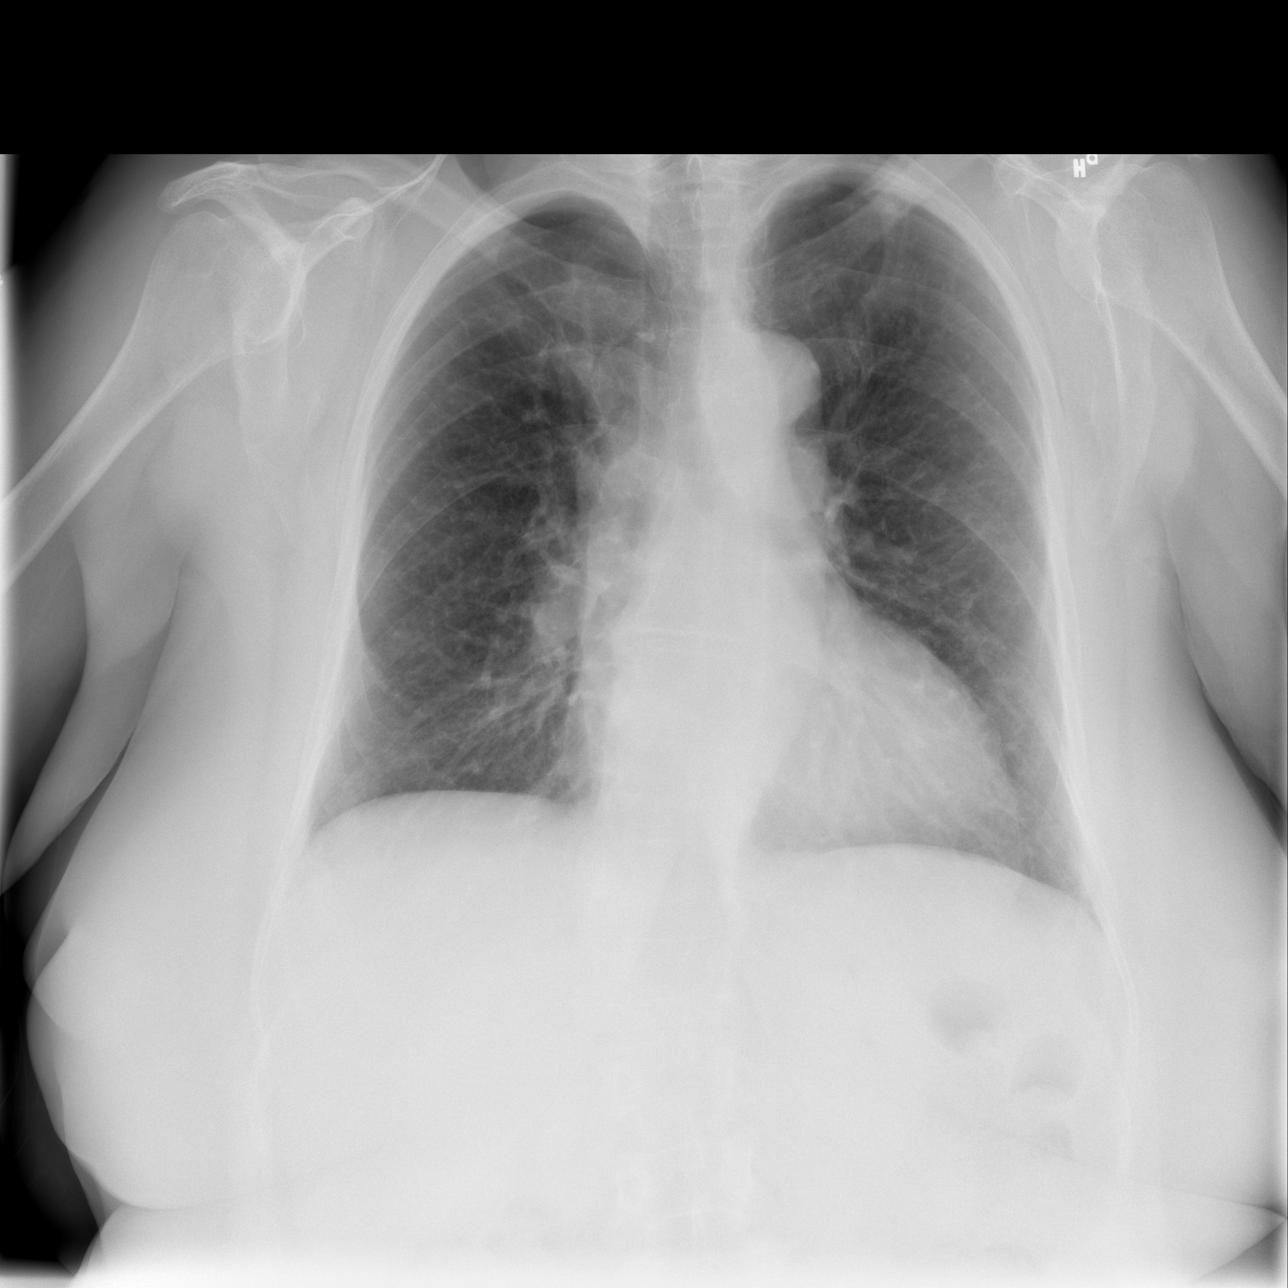

[w abdomen upright]
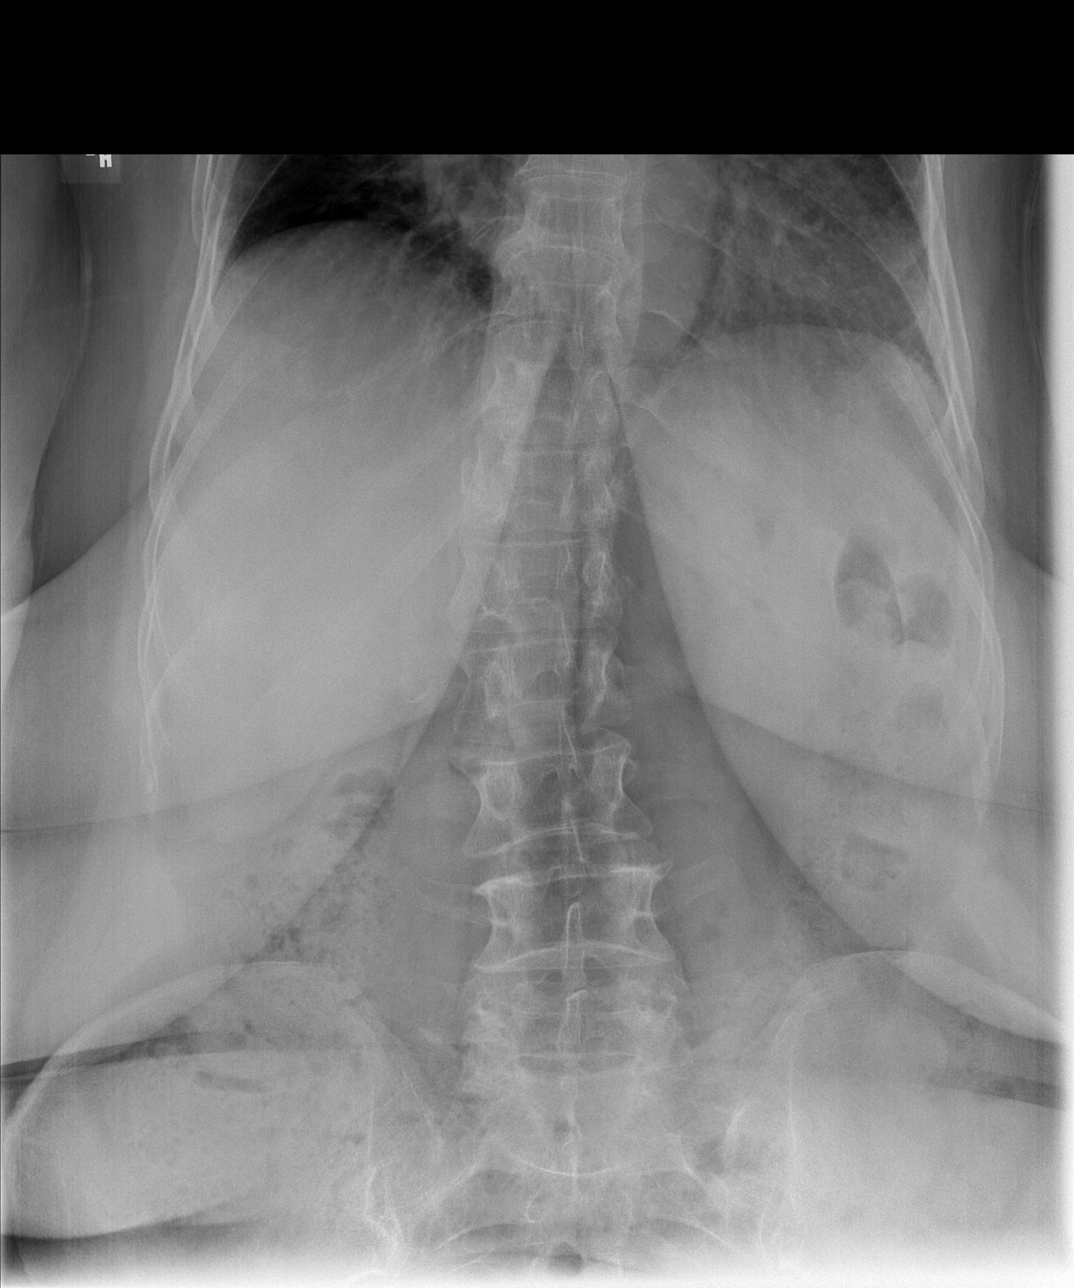

[t abdomen supine]
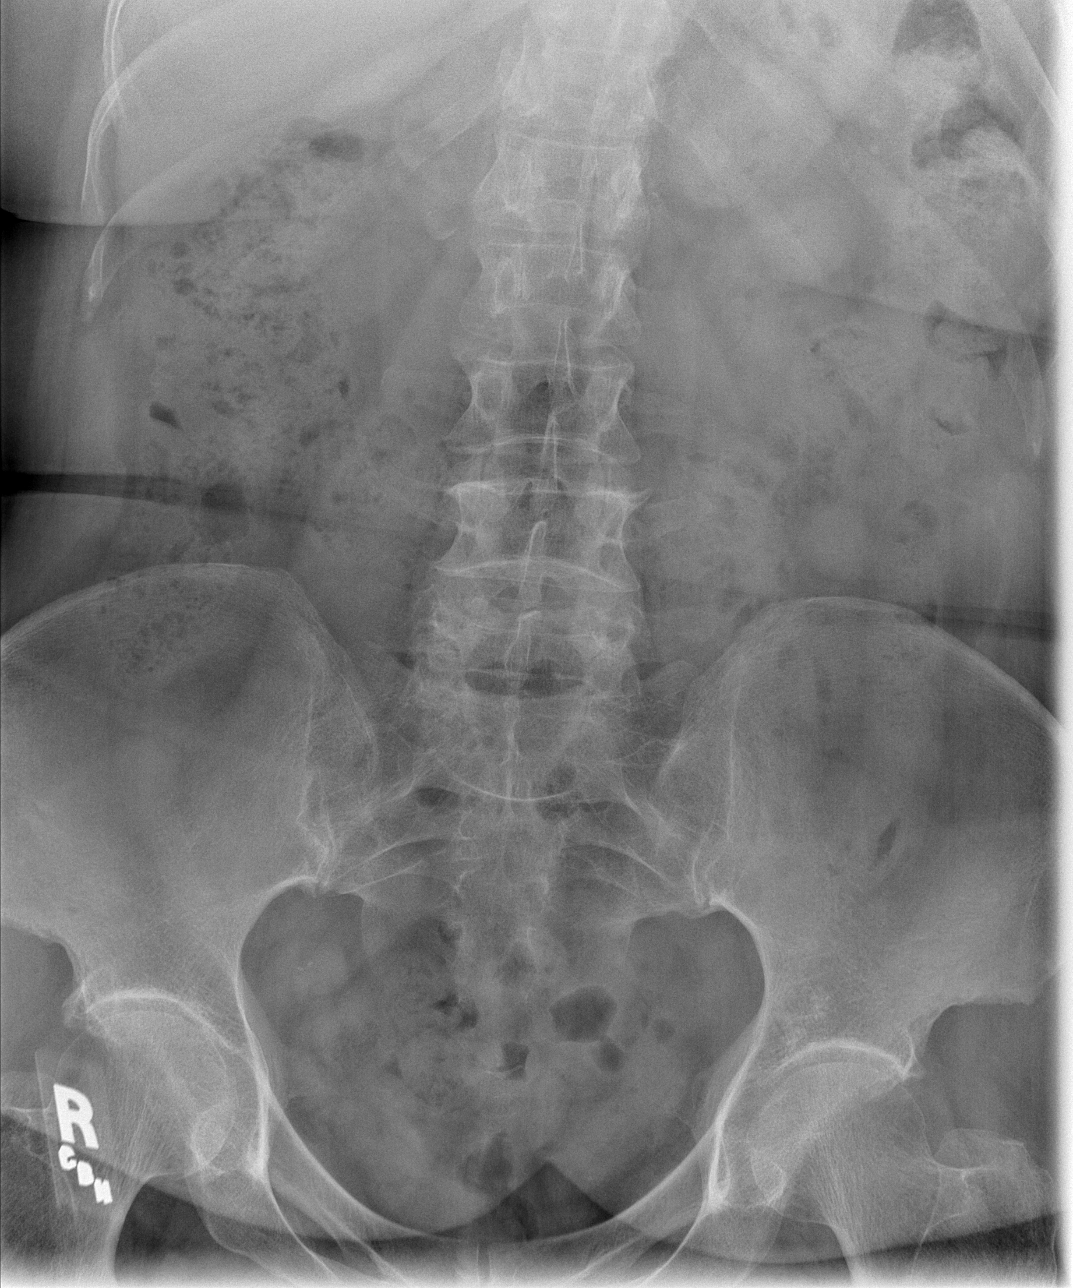

[3 of 3 positions shown; findings below may reference images not displayed]

FINDINGS: The heart is enlarged.  Aorta is calcified and tortuous.
There are no infiltrates or failure.  I see no pneumothorax or
effusion.  Bones are unremarkable.  Flat and erect abdomen reveals
constipation.  There is no obstruction or free air.  1 cm calcified
spherical density on the right opposite L1-2 previously noted as a
possible renal artery aneurysm.  No definite renal or ureteral
calculi.
IMPRESSION: No acute findings.  Constipation.

## 2010-01-08 IMAGING — CR DG CHEST 2V
2 series · 2 of 2 positions shown · non-contrast
Comparison: 11/17/2007

CLINICAL DATA: Ventral hernia.  Preop workup.  Hypertension.

CHEST - 2 VIEW

[view not recorded (1 of 2)]
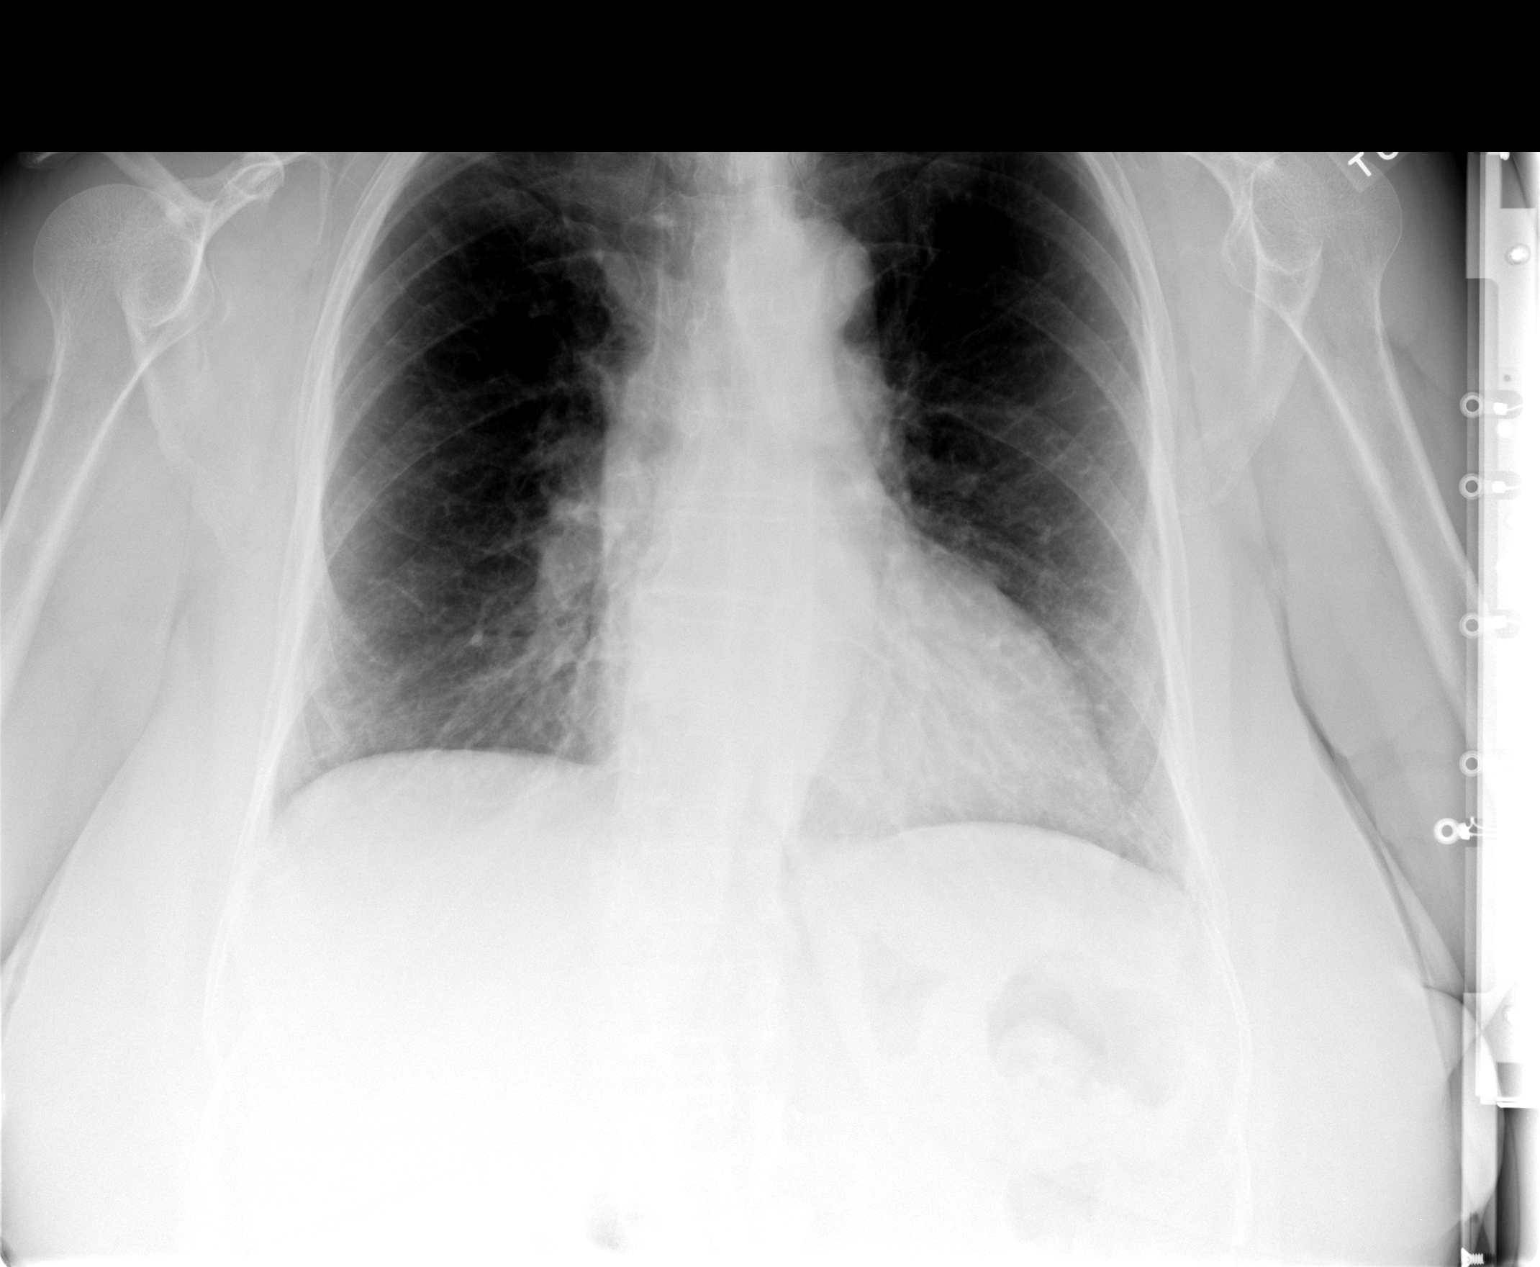

[view not recorded (2 of 2)]
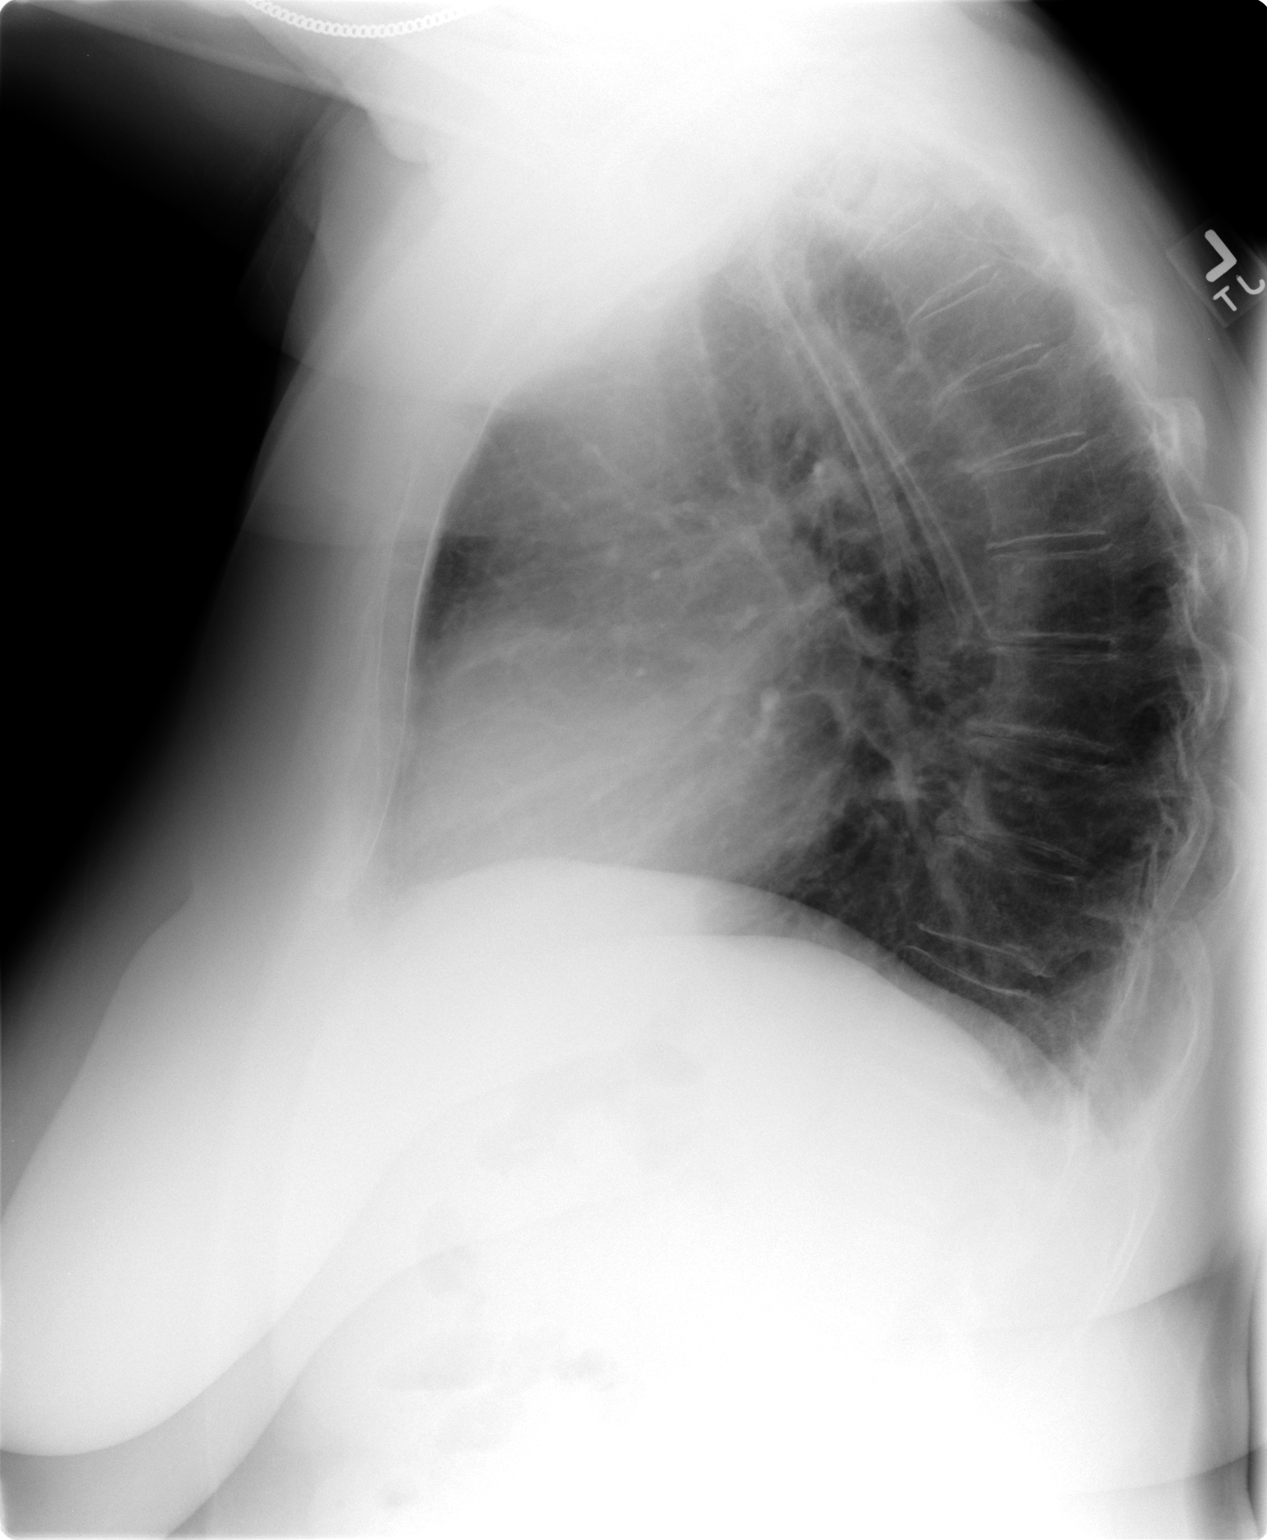

[2 of 2 positions shown; findings below may reference images not displayed]

FINDINGS: Cardiomegaly.  No acute pulmonary process. Stable small
nodular like density at the left lung apex. Bony thorax intact.
IMPRESSION: Cardiomegaly.  No interval change.

## 2010-02-26 IMAGING — CT CT ABDOMEN W/ CM
2 of 5 series · 16 of 46 positions shown, 18 images · IV contrast (READICAT/WATER & [ID] OMNI 300)
Comparison: 03/31/2008

CT ABDOMEN

CLINICAL DATA: Left lower quadrant pain status post hernia repair
6 weeks ago.  Colon resection status post injury during bladder
surgery.  Left inguinal hernia repair.  Hysterectomy.

CT ABDOMEN AND PELVIS WITH CONTRAST
TECHNIQUE: Multidetector CT imaging of the abdomen and pelvis was
performed following the standard protocol following the bolus
administration of intravenous contrast.
Contrast: 100 ml 2mnipaque-Q88

[Series 3: routine abdomen · axial · 0.74mm/px · z∈[-397,-42]mm · 13 of 76 slices shown, 15 images]
[im 5/76  soft-tissue]
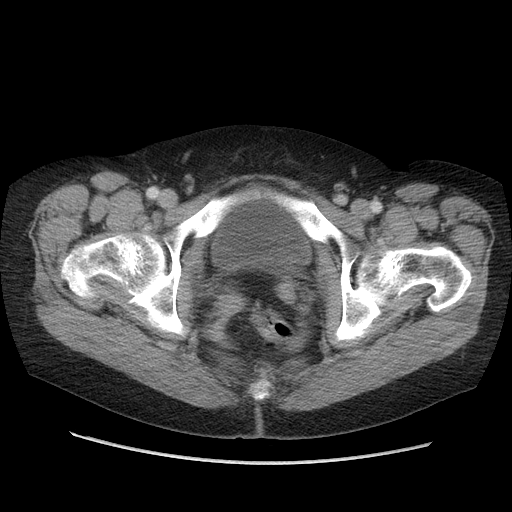
[im 5/76  bone]
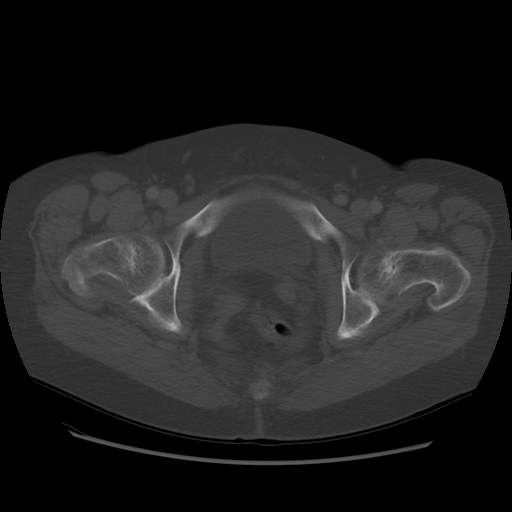
[im 9/76  soft-tissue]
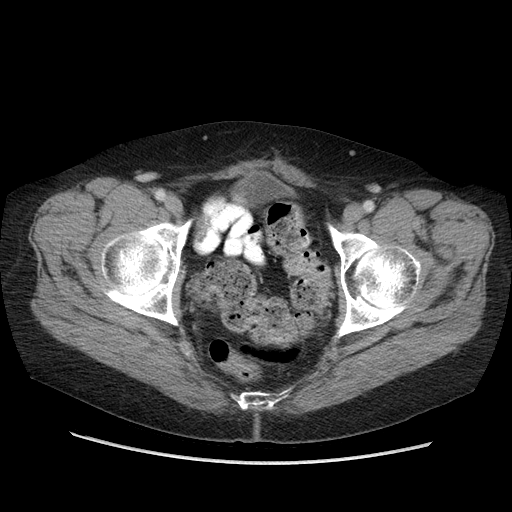
[im 17/76  soft-tissue]
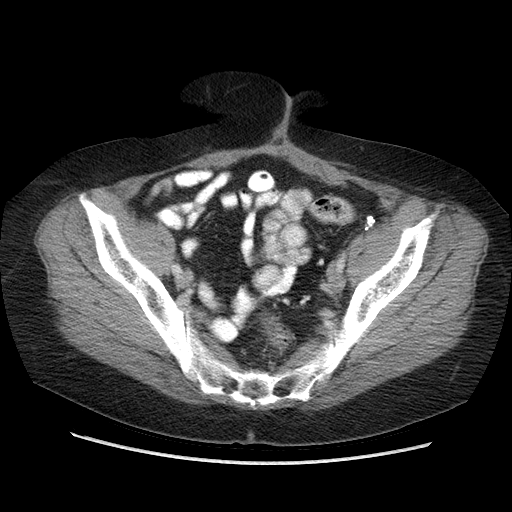
[im 21/76  soft-tissue]
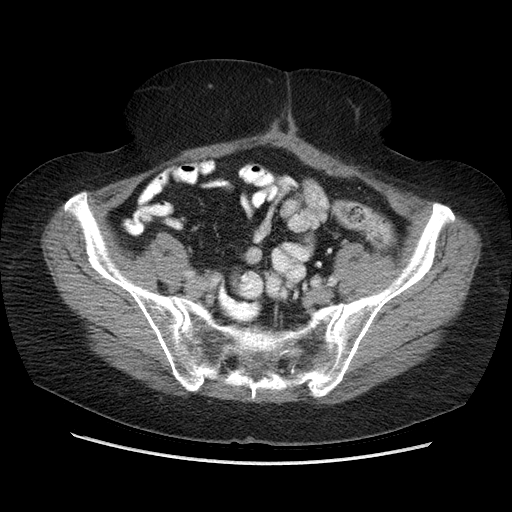
[im 26/76  soft-tissue]
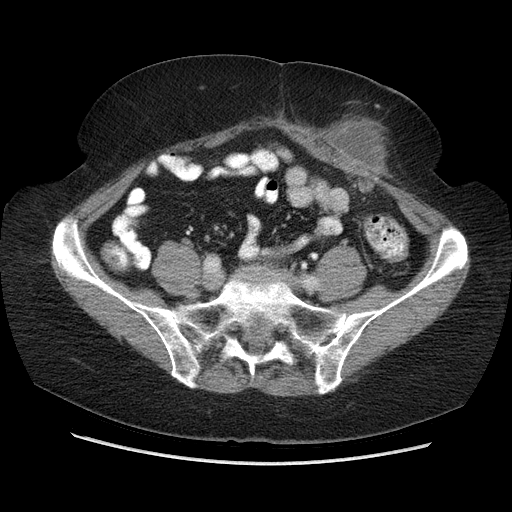
[im 34/76  soft-tissue]
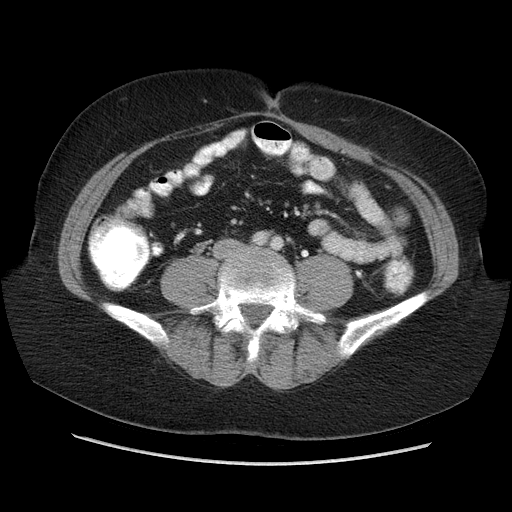
[im 38/76  soft-tissue]
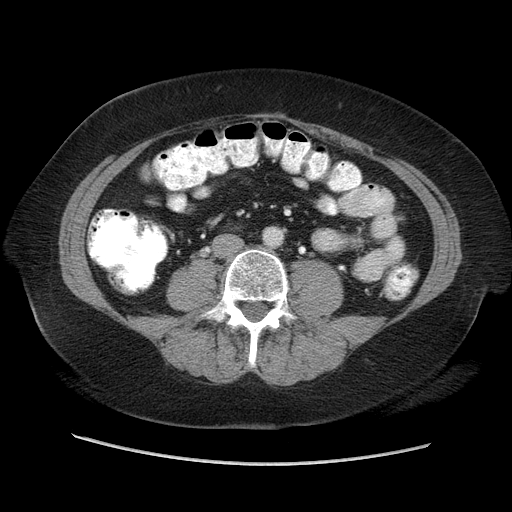
[im 42/76  soft-tissue]
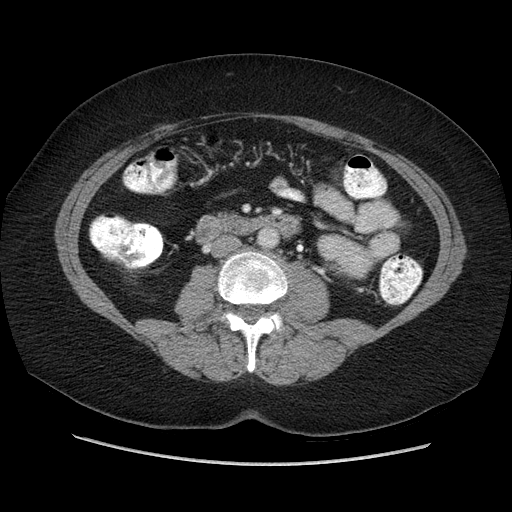
[im 51/76  soft-tissue]
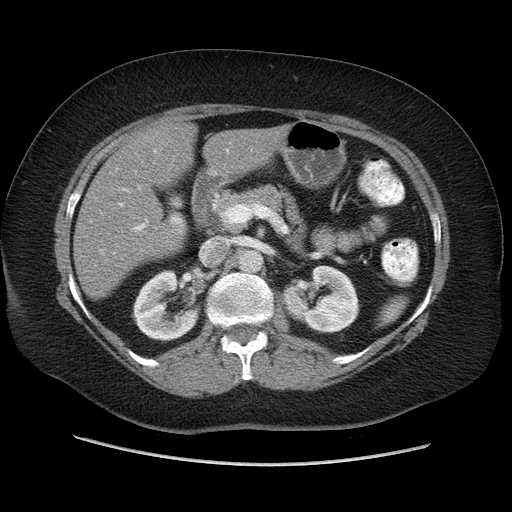
[im 51/76  bone]
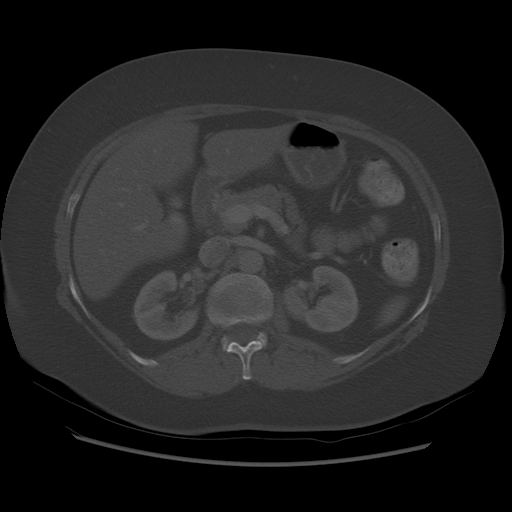
[im 55/76  soft-tissue]
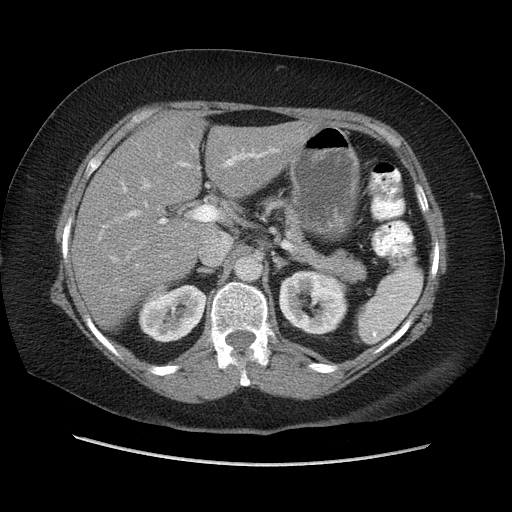
[im 59/76  soft-tissue]
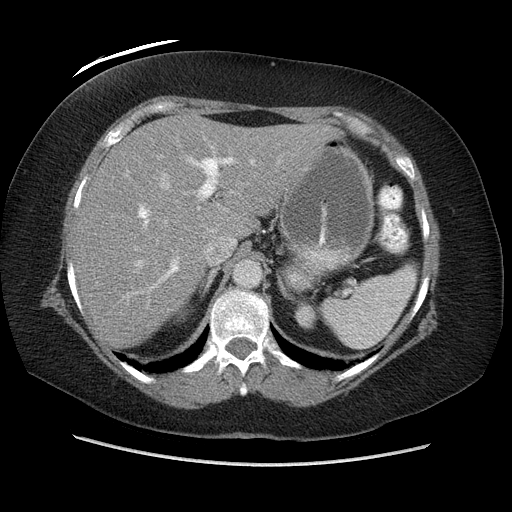
[im 67/76  soft-tissue]
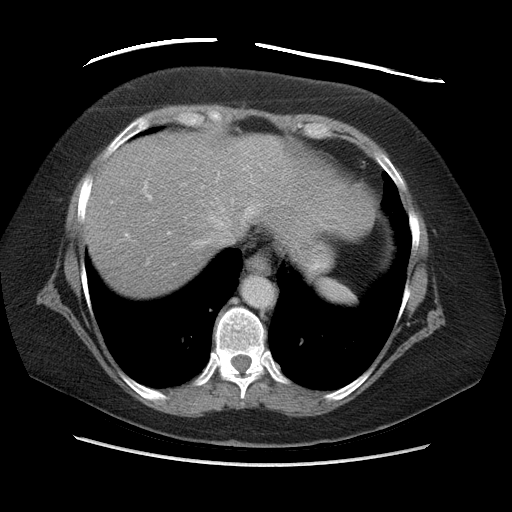
[im 71/76  soft-tissue]
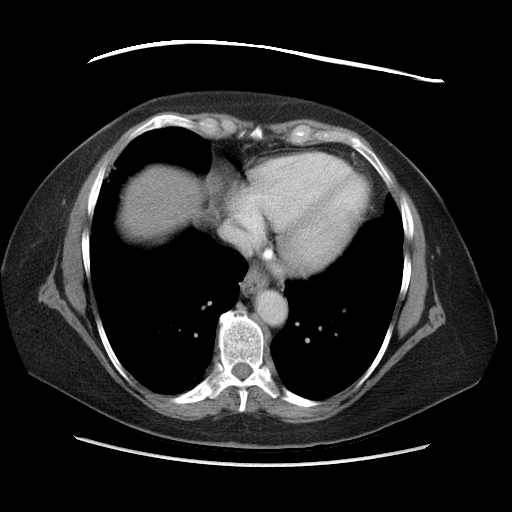

[Series 602: sagittal body · sagittal · 0.86mm/px · 3 of 152 slices shown]
[im 51/152  soft-tissue]
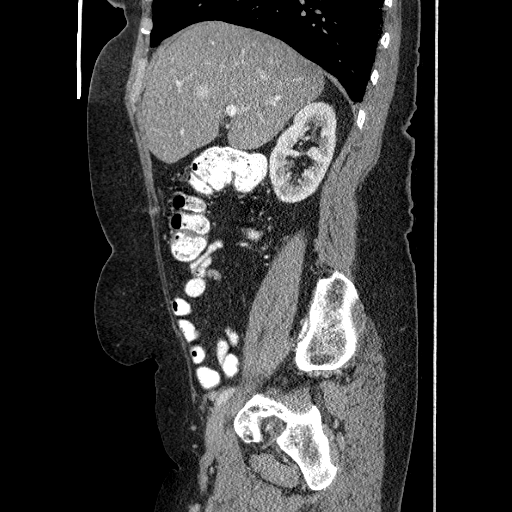
[im 68/152  soft-tissue]
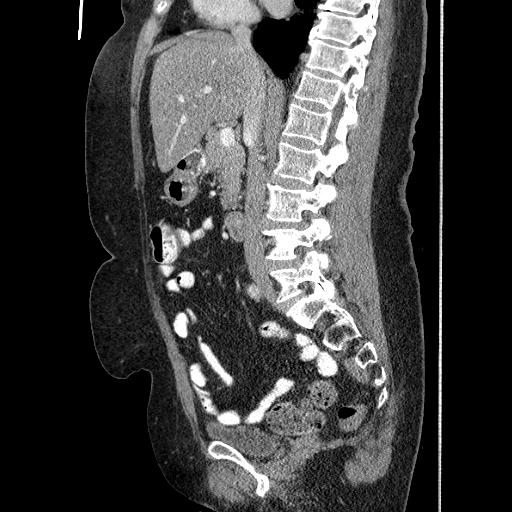
[im 84/152  soft-tissue]
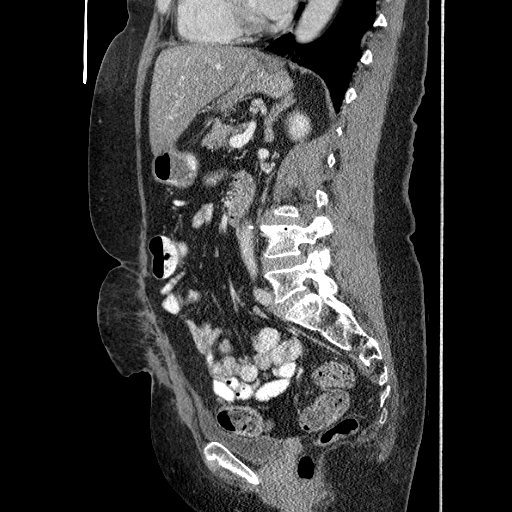

[16 of 46 positions shown; findings below may reference images not displayed]

FINDINGS: Old granulomas disease in the left lung base.  Mild
cardiomegaly. No pericardial or pleural effusion.  Tiny hiatal
hernia.  Development of moderate fatty infiltration of the liver.
Old granulomas disease in the spleen.  Normal stomach, pancreas.
Cholecystectomy.  The previously described biliary ductal
dilatation has resolved.  Common duct now maximally 7 mm.  No
intrahepatic ductal dilatation. Normal right adrenal gland.
Minimal nodularity in the left adrenal gland lateral limb is
similar to on the prior exam and decreased since 07/18/2006.

right renal artery aneurysm of 8 mm on image 28.  Similar back to
07/18/2006.  Too small to characterize left renal lesions.  Mild
renal cortical thinning primarily on the left. No retroperitoneal
or retrocrural adenopathy.

Normal colon and terminal ileum.  Normal abdominal small bowel
without ascites. Interval repair of previous described left
abdominal wall hernia.  At the site of hernia repair, a
subcutaneous fluid collection measures 4.6 x 3.3 cm on image 53.
Mild surrounding enhancement and subcutaneous edema.  Just the deep
to the peritoneal reflection, an immediately adjacent collection
measures 3.1 x 1.7 cm on image 52.  Suspect communication between
these two collections.  The adjacent bowel is uninvolved.  No
evidence of enterocutaneous fistula.  No gas within either
collection.
IMPRESSION: 1.  Status post left anterior abdominal wall hernia repair with two
adjacent, likely communicating fluid collections at the surgical
site.  Mild peripheral enhancement and surrounding edema.  Although
these could relate to postoperative hematoma/seroma, infection
cannot be excluded.  Consider sampling. I called and personally
discussed this report with Safron, Ferienwohnungen at [DATE] a.m. on
05/24/2008.
2.  No involvement of the adjacent bowel nor acute process within
the abdomen.

3.  Development of moderate fatty infiltration of the liver.
4.  Resolution of previously described common duct dilatation.
5.  8 mm right renal artery aneurysm, similar over prior exams.

CT PELVIS
FINDINGS: Colonic stool burden suggests constipation.

Normal pelvic small bowel.  Small left inguinal nodes which are
likely reactive. Normal urinary bladder.  Hysterectomy.  No adnexal
mass.  No ascites.  Calcified left hemipelvic nodes are likely
related to old granulomatous disease.  Mild osteopenia
IMPRESSION: 1.  Hysterectomy without acute pelvic process.
2.  Question constipation.

## 2010-03-07 IMAGING — US US PARACENTESIS
1 series · 13 of 13 positions shown · non-contrast
Comparison: none

CLINICAL HISTORY: Postoperative fluid collection in the left lower
abdomen.

[Series 1: us paracentesis · 0.14mm/px · 13 of 13 slices shown]
[im 1/13]
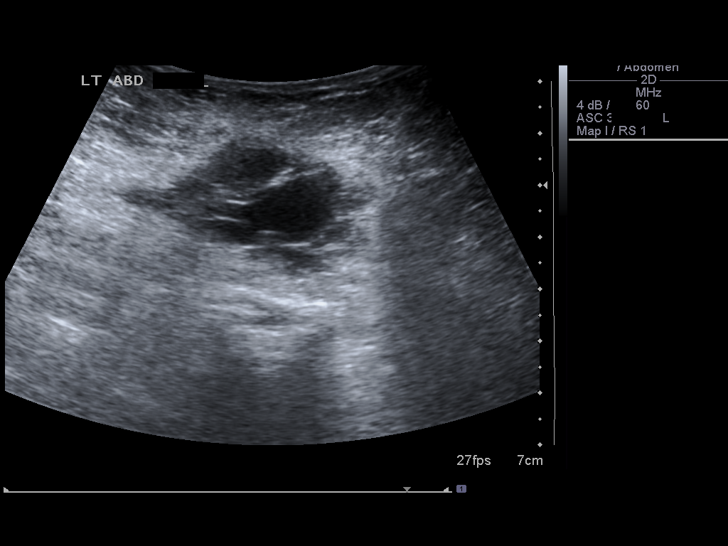
[im 2/13]
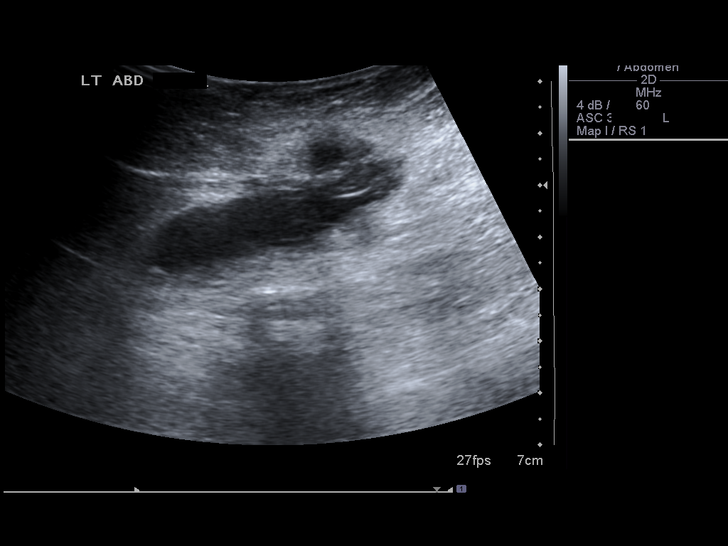
[im 3/13]
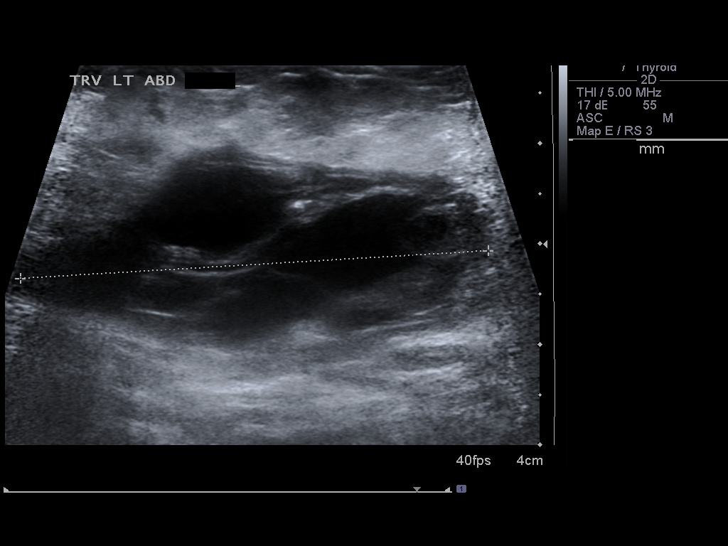
[im 4/13]
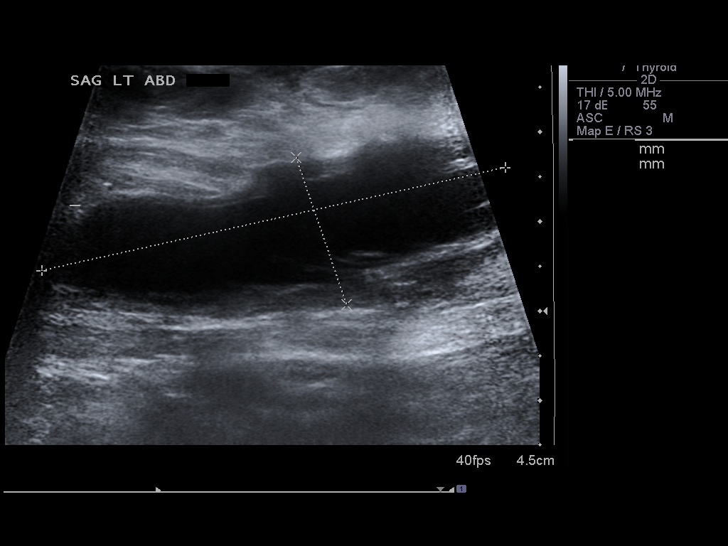
[im 5/13]
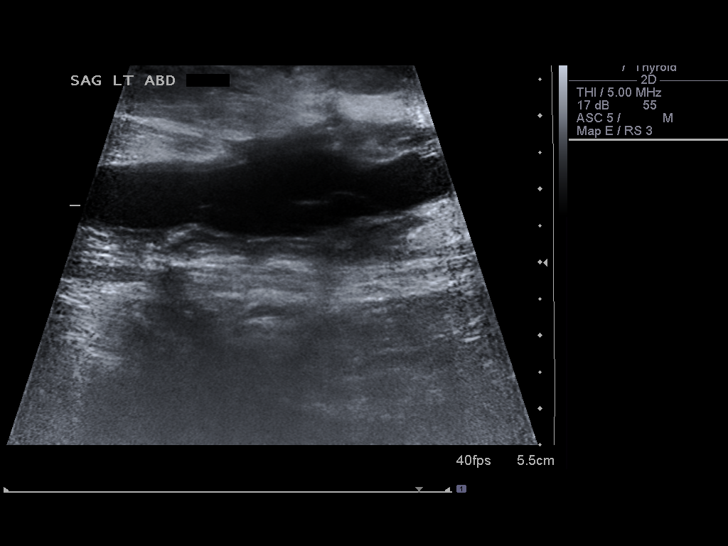
[im 6/13]
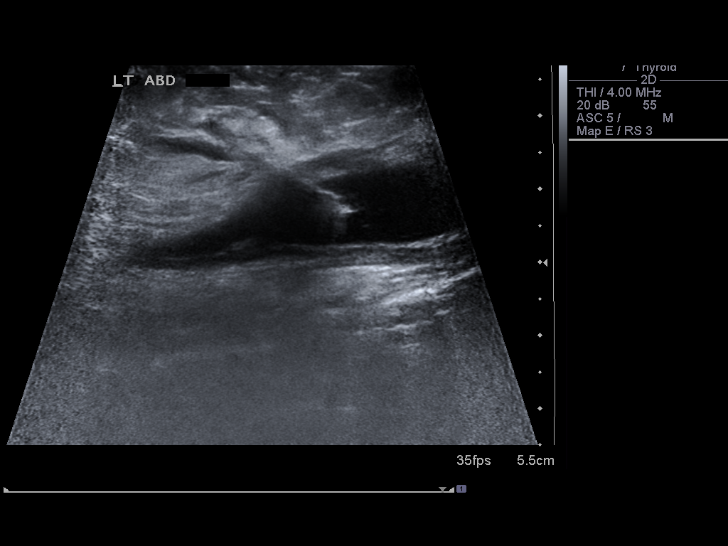
[im 7/13]
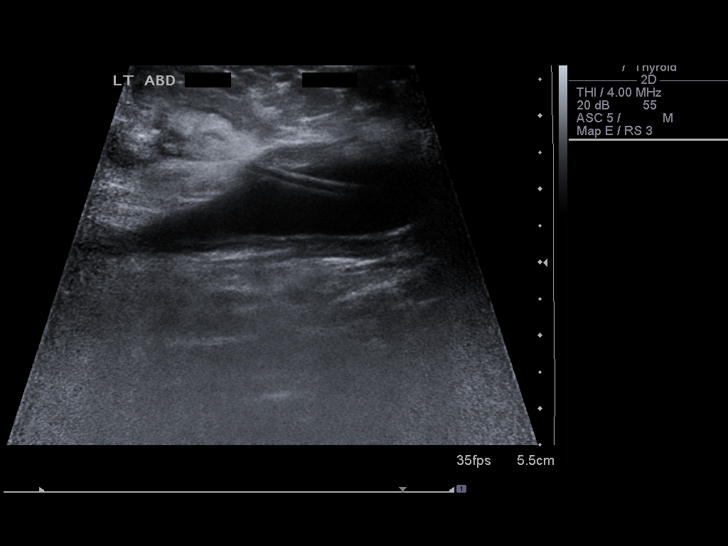
[im 8/13]
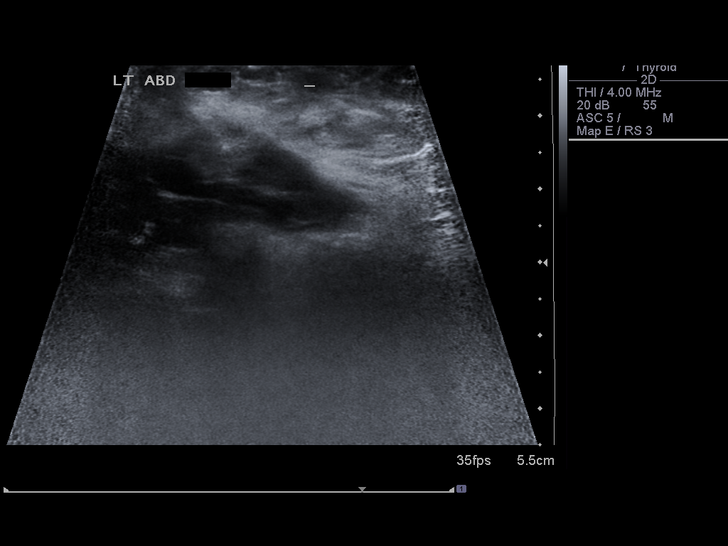
[im 9/13]
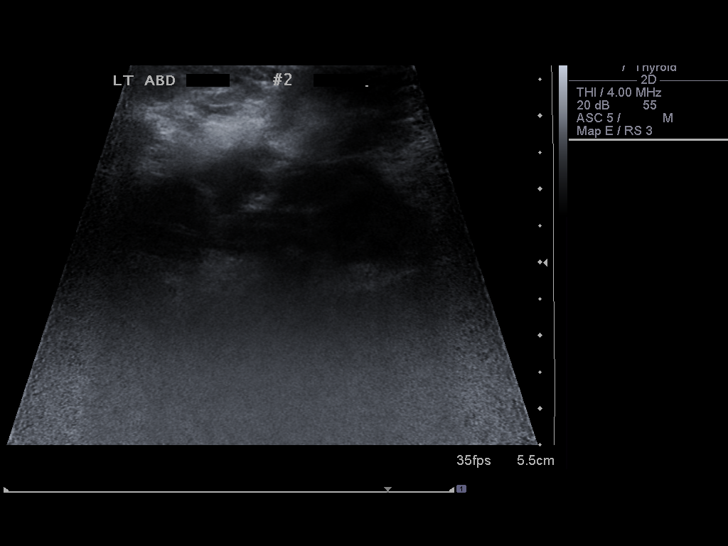
[im 10/13]
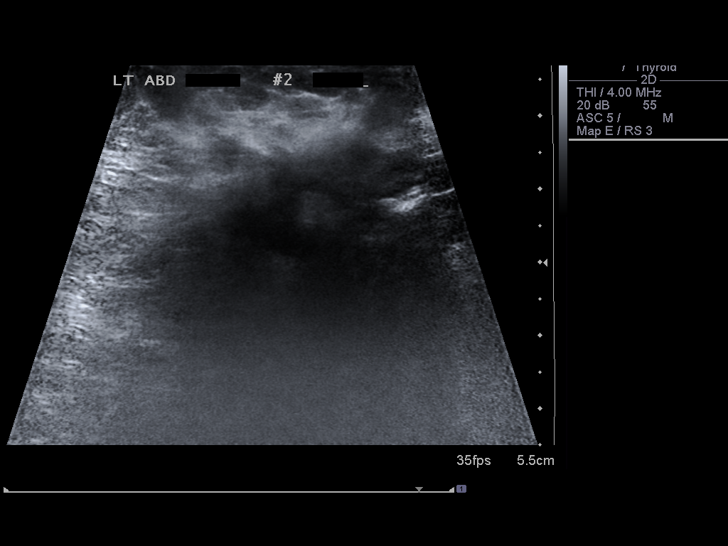
[im 11/13]
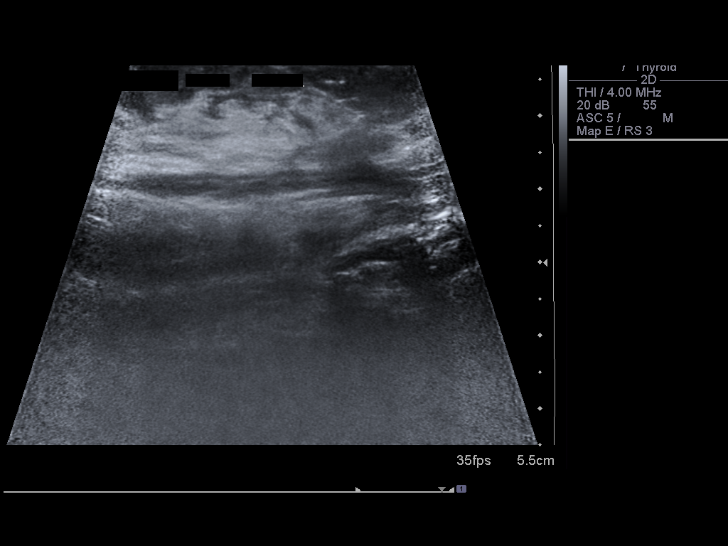
[im 12/13]
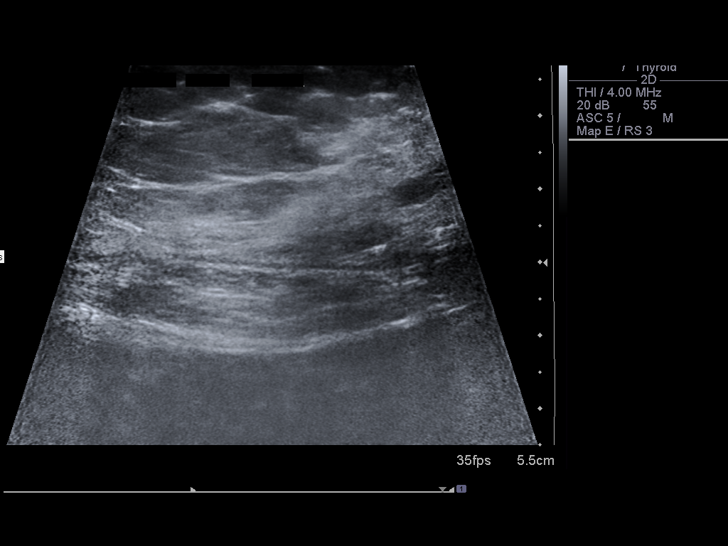
[im 13/13]
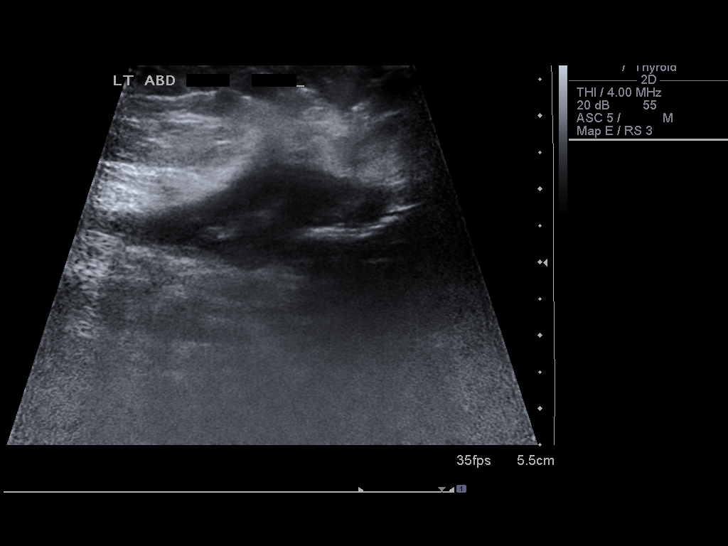

[13 of 13 positions shown; findings below may reference images not displayed]

PROCEDURE(S): ULTRASOUND GUIDED DRAINAGE OF A SUBCUTANEOUS FLUID
COLLECTION.

Medications:None

Sedation time:None

Fluoroscopy time: None

Procedure:Consent was obtained for an ultrasound guided drainage
procedure.  Ultrasound demonstrated an irregular fluid collection
in the left lower abdomen subcutaneous tissues.  The skin was
prepped with Betadine.  The skin was anesthetized with 1%
lidocaine.  A 19 gauge Yueh  needle was directed into the
collection and 15 ml of dark red fluid was removed.  There was a
residual small collection. This residual collection was aspirated
with a new 19 gauge Yueh needle. Additional 4 ml of dark red fluid
was removed.  Majority of the collection was removed at the end of
procedure.
FINDINGS: Complex fluid collection in the left lower abdomen
subcutaneous tissues.
IMPRESSION: Ultrasound guided drainage of a subcutaneous fluid
collection.

## 2010-03-08 ENCOUNTER — Encounter: Admission: RE | Admit: 2010-03-08 | Discharge: 2010-03-08 | Payer: Self-pay | Admitting: Internal Medicine

## 2010-03-09 ENCOUNTER — Emergency Department (HOSPITAL_COMMUNITY): Admission: EM | Admit: 2010-03-09 | Discharge: 2010-03-09 | Payer: Self-pay | Admitting: Emergency Medicine

## 2010-03-23 ENCOUNTER — Encounter (HOSPITAL_BASED_OUTPATIENT_CLINIC_OR_DEPARTMENT_OTHER)
Admission: RE | Admit: 2010-03-23 | Discharge: 2010-05-17 | Payer: Self-pay | Source: Home / Self Care | Attending: Internal Medicine | Admitting: Internal Medicine

## 2010-04-16 ENCOUNTER — Encounter: Admission: RE | Admit: 2010-04-16 | Discharge: 2010-04-16 | Payer: Self-pay | Admitting: Internal Medicine

## 2010-06-17 ENCOUNTER — Encounter: Payer: Self-pay | Admitting: Surgery

## 2010-06-17 ENCOUNTER — Encounter: Payer: Self-pay | Admitting: Internal Medicine

## 2010-08-12 LAB — DIFFERENTIAL
Basophils Absolute: 0.1 10*3/uL (ref 0.0–0.1)
Basophils Relative: 1 % (ref 0–1)
Eosinophils Absolute: 0.4 10*3/uL (ref 0.0–0.7)
Eosinophils Relative: 7 % — ABNORMAL HIGH (ref 0–5)
Lymphocytes Relative: 39 % (ref 12–46)
Lymphs Abs: 2.2 10*3/uL (ref 0.7–4.0)
Monocytes Absolute: 0.6 10*3/uL (ref 0.1–1.0)
Monocytes Relative: 11 % (ref 3–12)
Neutro Abs: 2.4 10*3/uL (ref 1.7–7.7)
Neutrophils Relative %: 42 % — ABNORMAL LOW (ref 43–77)

## 2010-08-12 LAB — POCT I-STAT, CHEM 8
BUN: 16 mg/dL (ref 6–23)
Calcium, Ion: 1.06 mmol/L — ABNORMAL LOW (ref 1.12–1.32)
Chloride: 108 mEq/L (ref 96–112)
Creatinine, Ser: 1.4 mg/dL — ABNORMAL HIGH (ref 0.4–1.2)
Glucose, Bld: 82 mg/dL (ref 70–99)
HCT: 42 % (ref 36.0–46.0)
Hemoglobin: 14.3 g/dL (ref 12.0–15.0)
Potassium: 5.4 mEq/L — ABNORMAL HIGH (ref 3.5–5.1)
Sodium: 138 mEq/L (ref 135–145)
TCO2: 27 mmol/L (ref 0–100)

## 2010-08-12 LAB — CBC
HCT: 40.5 % (ref 36.0–46.0)
Hemoglobin: 13.8 g/dL (ref 12.0–15.0)
MCHC: 34 g/dL (ref 30.0–36.0)
MCV: 88.2 fL (ref 78.0–100.0)
Platelets: 186 10*3/uL (ref 150–400)
RBC: 4.59 MIL/uL (ref 3.87–5.11)
RDW: 14.9 % (ref 11.5–15.5)
WBC: 5.8 10*3/uL (ref 4.0–10.5)

## 2010-08-12 LAB — SEDIMENTATION RATE: Sed Rate: 19 mm/hr (ref 0–22)

## 2010-08-12 LAB — PROTIME-INR
INR: 0.93 (ref 0.00–1.49)
Prothrombin Time: 12.4 seconds (ref 11.6–15.2)

## 2010-08-30 IMAGING — RF DG UGI W/ HIGH DENSITY W/KUB
14 series · 14 of 14 positions shown · non-contrast
Comparison: none

CLINICAL DATA: Epigastric pain.  History of hiatal hernia and
reflux.

UPPER GI SERIES W/HIGH DENSITY W/KUB
TECHNIQUE: After obtaining a scout radiograph, upper GI series
performed with high density barium and effervescent agent. Thin
barium also used.
Fluoroscopy Time: 1.2 minutes

[Series 1: run · 1 of 1 slices shown (1 of 13)]
[im 1/1]
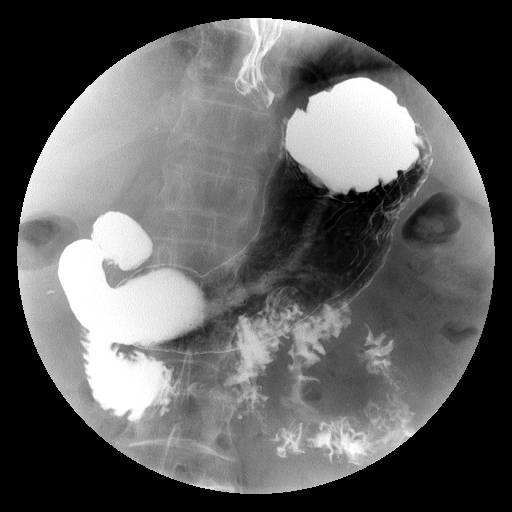

[Series 2: run · 1 of 1 slices shown (2 of 13)]
[im 1/1]
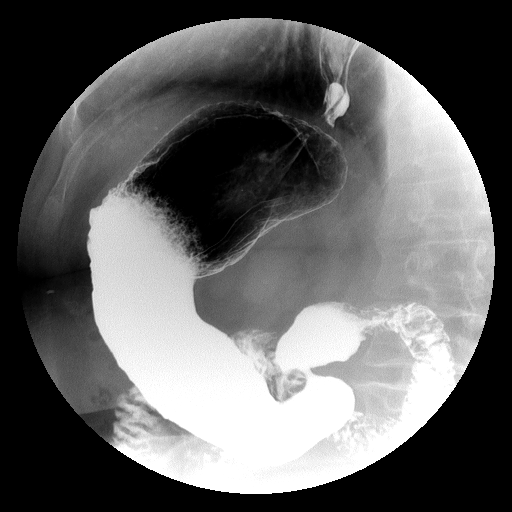

[Series 3: run · 1 of 1 slices shown (3 of 13)]
[im 1/1]
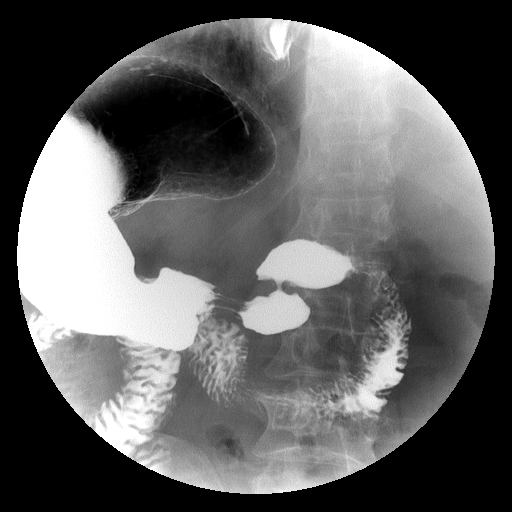

[Series 4: run · 1 of 1 slices shown (4 of 13)]
[im 1/1]
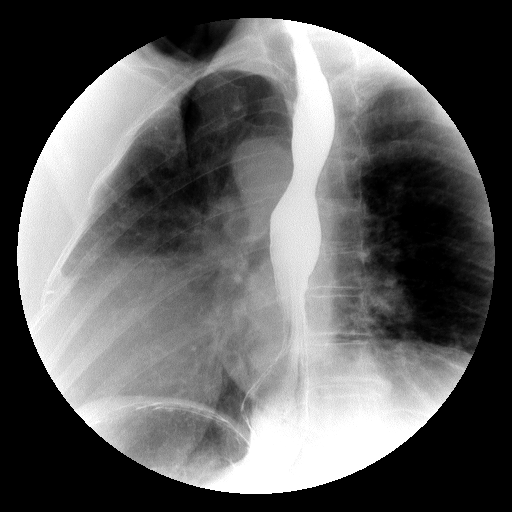

[Series 5: run · 1 of 1 slices shown (5 of 13)]
[im 1/1]
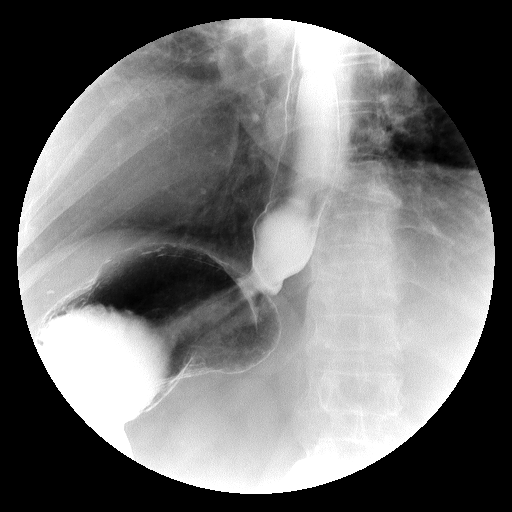

[Series 6: run · 1 of 1 slices shown (6 of 13)]
[im 1/1]
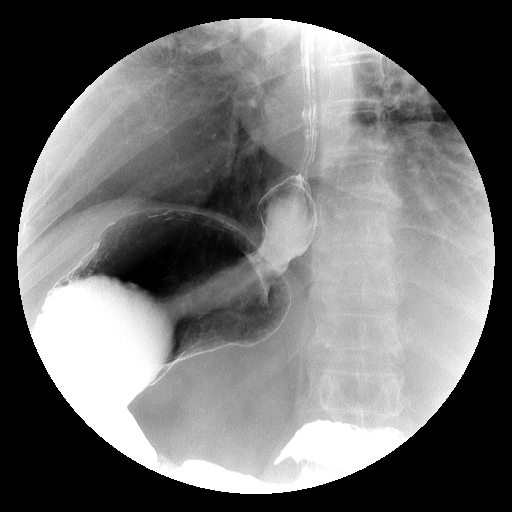

[Series 7: run · 1 of 1 slices shown (7 of 13)]
[im 1/1]
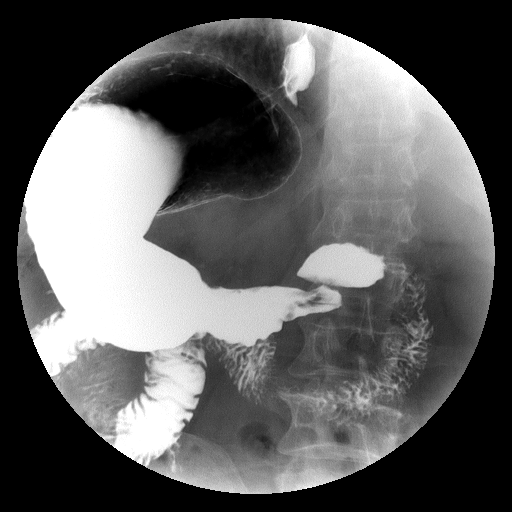

[Series 8: run · 1 of 1 slices shown (8 of 13)]
[im 1/1]
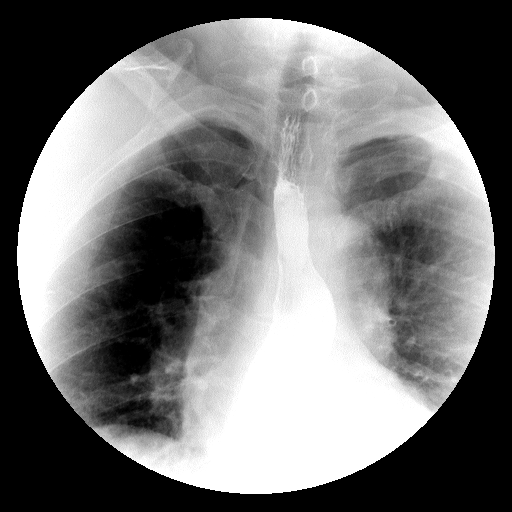

[Series 9: run · 1 of 1 slices shown (9 of 13)]
[im 1/1]
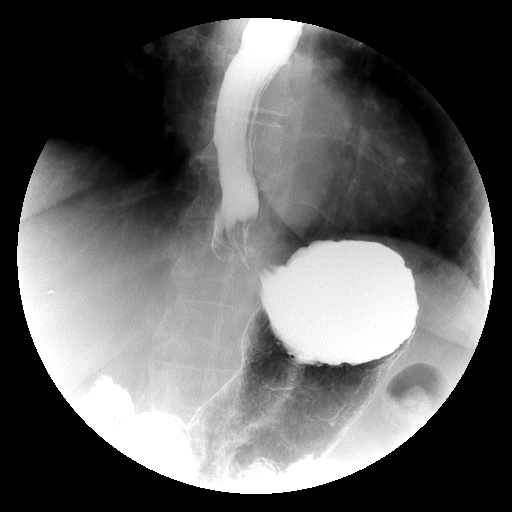

[Series 10: run · 1 of 1 slices shown (10 of 13)]
[im 1/1]
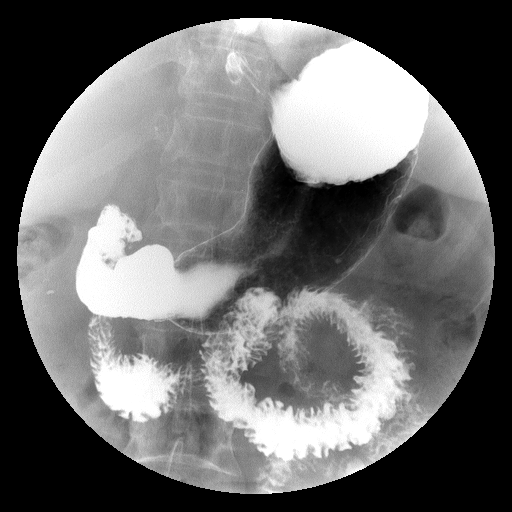

[Series 11: run · 1 of 1 slices shown (11 of 13)]
[im 1/1]
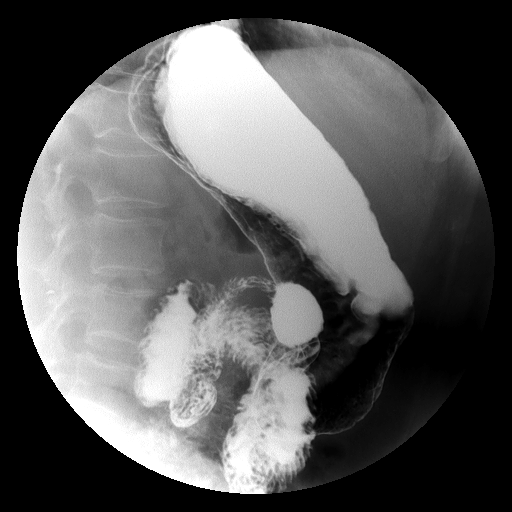

[Series 12: run · 1 of 1 slices shown (12 of 13)]
[im 1/1]
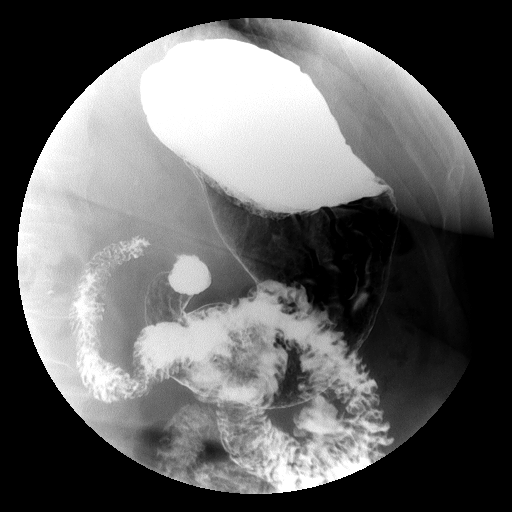

[Series 13: run · 1 of 1 slices shown (13 of 13)]
[im 1/1]
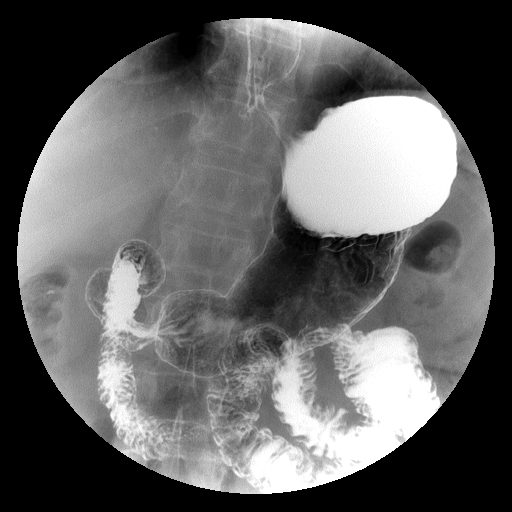

[Series 1001: view not recorded · 0.20mm/px · 1 of 1 slices shown]
[im 1/1]
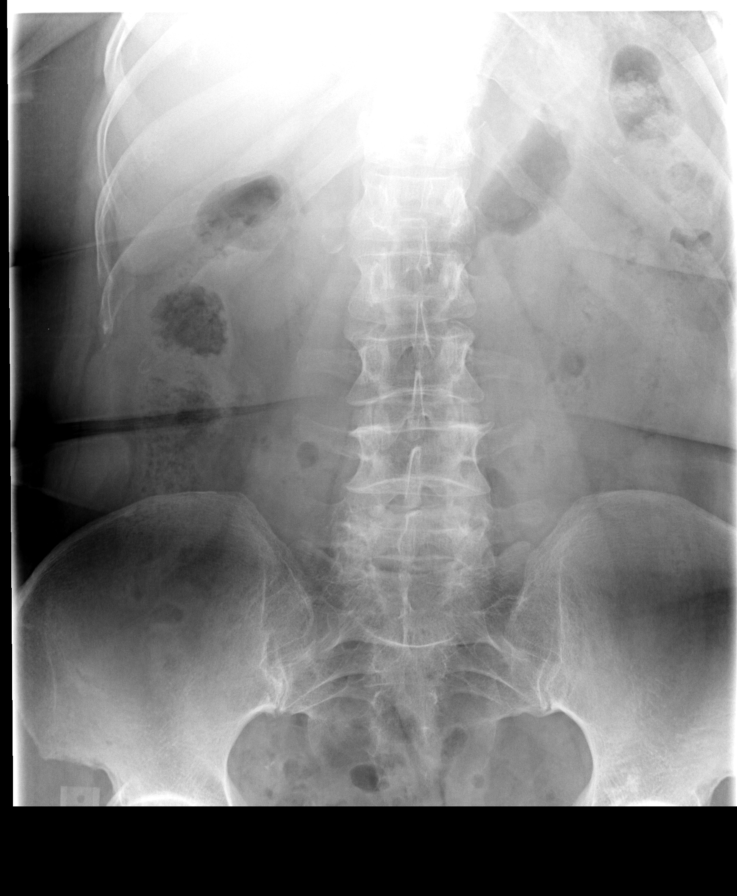

[14 of 14 positions shown; findings below may reference images not displayed]

FINDINGS: Normal esophageal motility.  Small sliding type hiatal
hernia.  No esophageal stricture.  Marked gastroesophageal reflux.
Barium refluxed from the stomach up into the proximal esophagus.
No gastric or duodenal mass, mucosal fold thickening, or
ulceration.
IMPRESSION: Small hiatal hernia and marked GE reflux.

## 2010-09-10 ENCOUNTER — Other Ambulatory Visit: Payer: Self-pay | Admitting: Internal Medicine

## 2010-09-10 DIAGNOSIS — R110 Nausea: Secondary | ICD-10-CM

## 2010-09-10 LAB — BODY FLUID CULTURE: Culture: NO GROWTH

## 2010-09-11 ENCOUNTER — Ambulatory Visit
Admission: RE | Admit: 2010-09-11 | Discharge: 2010-09-11 | Disposition: A | Discharge status: Home / Self Care | Payer: Medicare Other | Source: Ambulatory Visit | Attending: Internal Medicine | Admitting: Internal Medicine

## 2010-09-11 DIAGNOSIS — R110 Nausea: Secondary | ICD-10-CM

## 2010-09-11 IMAGING — CT CT CHEST W/ CM
2 of 5 series · 14 of 30 positions shown, 16 images · IV contrast (agent unspecified)
Comparison: 06/15/2003

CLINICAL DATA: Shortness of breath.  Question interstitial lung
disease.

CT CHEST WITH CONTRAST
TECHNIQUE: Multidetector CT imaging of the chest was performed
following the standard protocol during bolus administration of
intravenous contrast.
Contrast: 75 ml Qmnipaque-WJJ

[Series 3: routine chest · axial · 0.62mm/px · z∈[-260,-74]mm · 6 of 53 slices shown, 8 images]
[im 8/53  mediastinal]
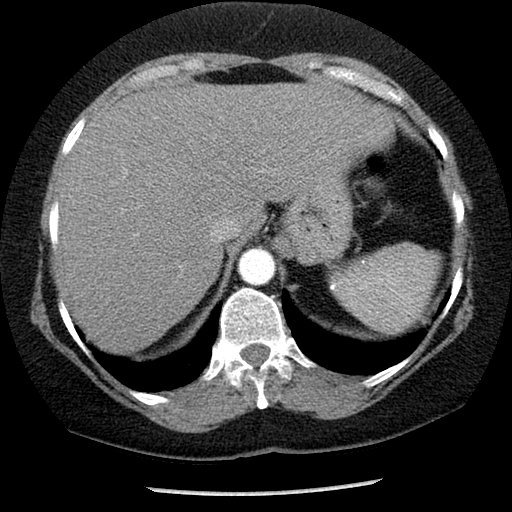
[im 8/53  lung]
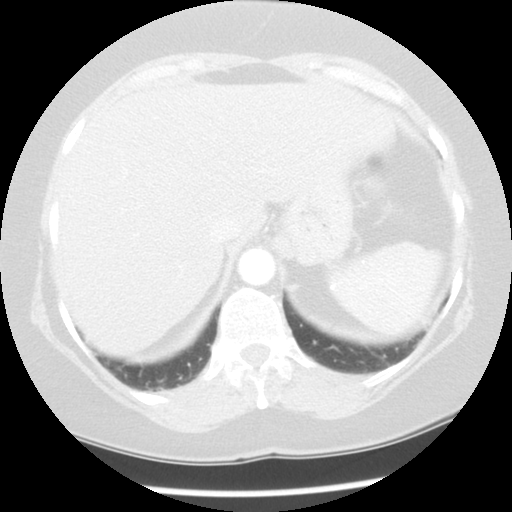
[im 15/53  lung]
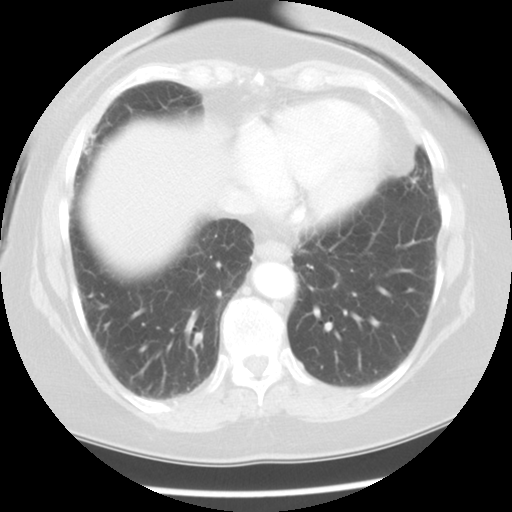
[im 23/53  lung]
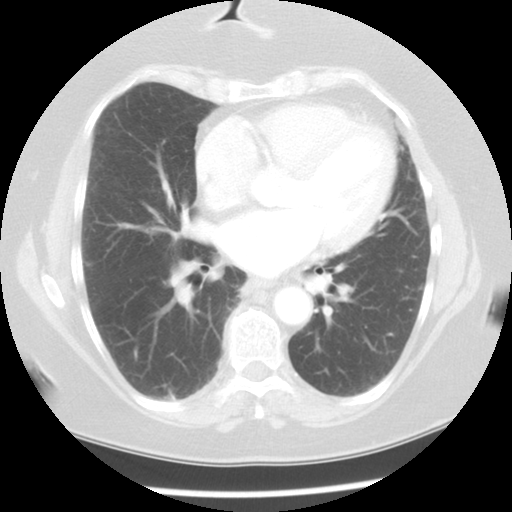
[im 30/53  lung]
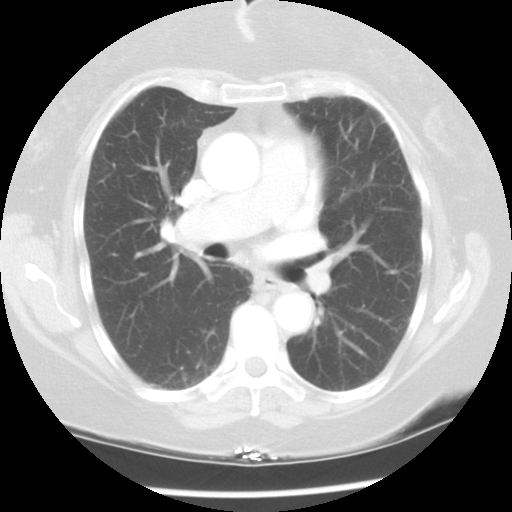
[im 38/53  mediastinal]
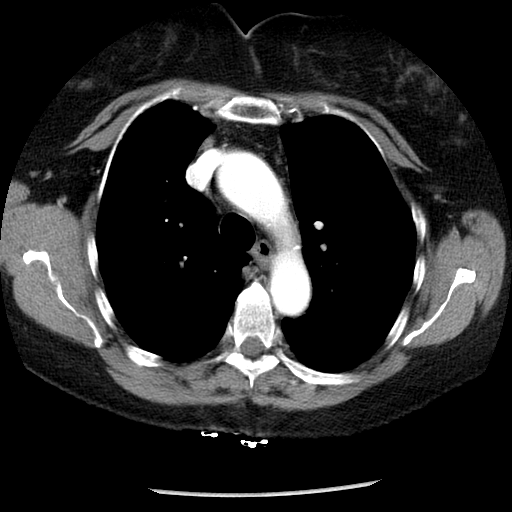
[im 38/53  lung]
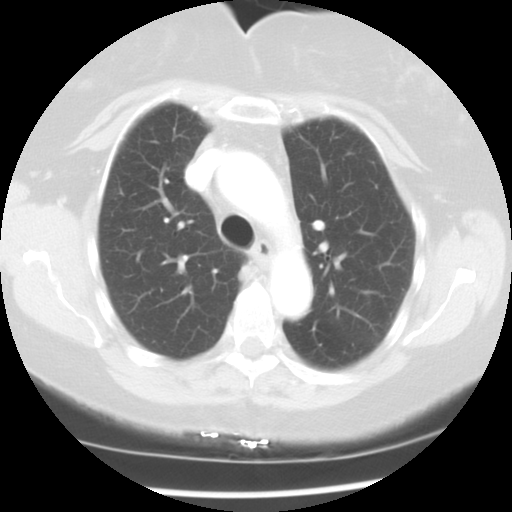
[im 45/53  lung]
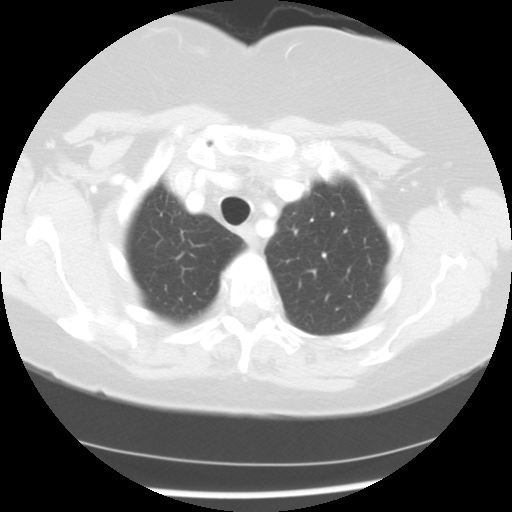

[Series 602: sagittal body · sagittal · 0.62mm/px · 8 of 129 slices shown]
[im 16/129  mediastinal]
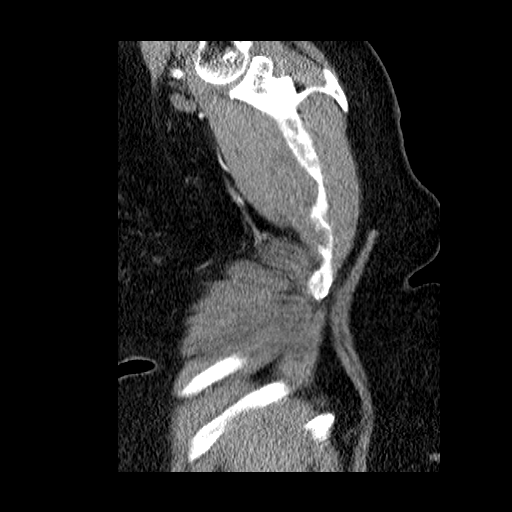
[im 31/129  mediastinal]
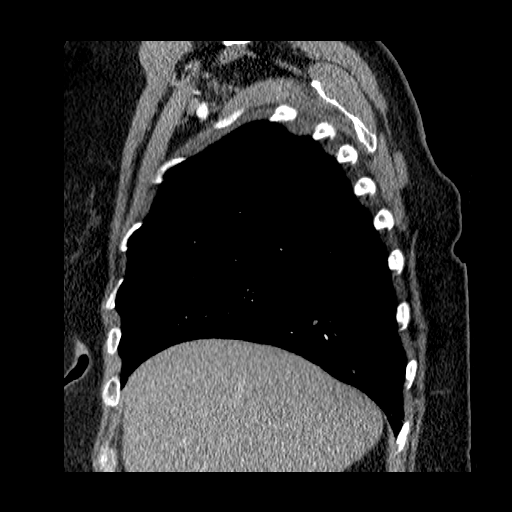
[im 46/129  mediastinal]
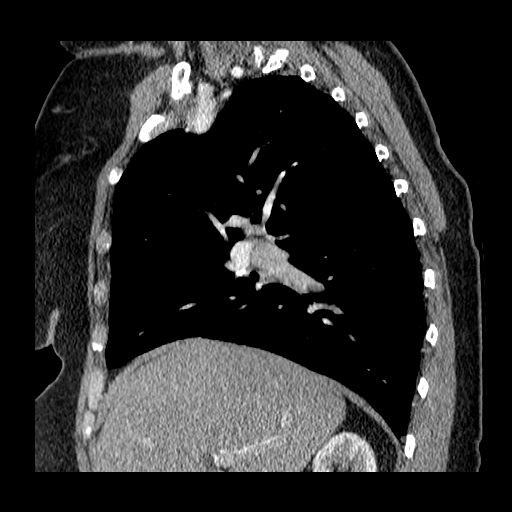
[im 61/129  mediastinal]
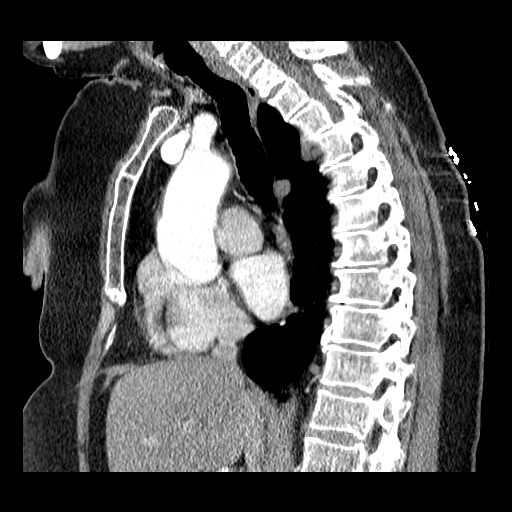
[im 76/129  mediastinal]
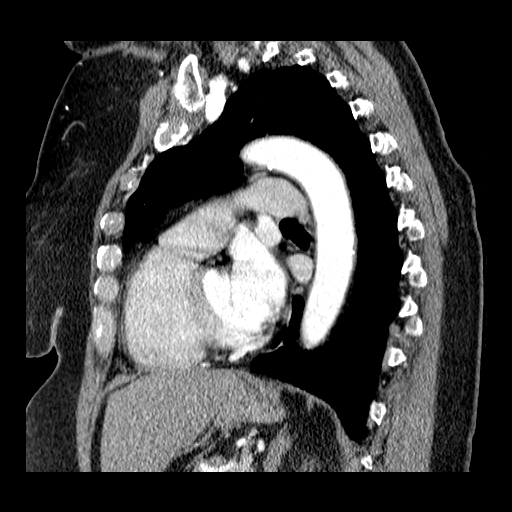
[im 91/129  mediastinal]
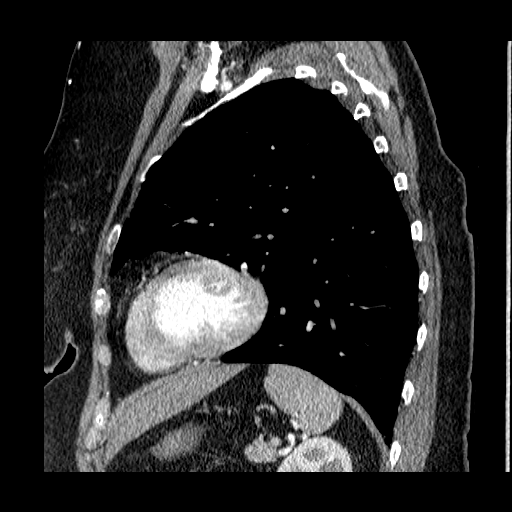
[im 106/129  mediastinal]
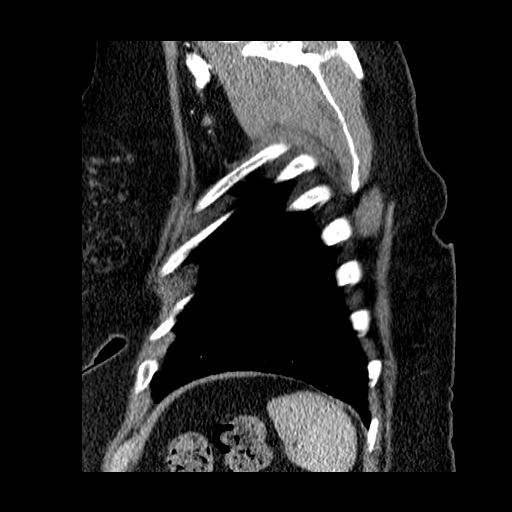
[im 121/129  mediastinal]
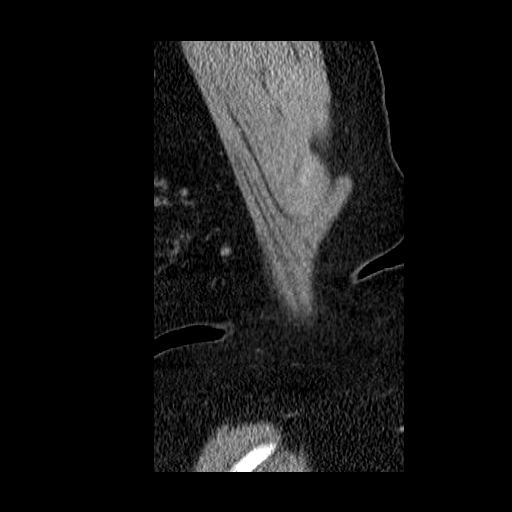

[14 of 30 positions shown; findings below may reference images not displayed]

FINDINGS: No pathologically enlarged mediastinal, hilar or axillary
lymph nodes.  Heart is at the upper limits of normal in size.  No
pericardial effusion.

A calcified granuloma is seen at the apex of the left upper lobe.
There may be very mild subpleural reticulation, with a basilar
predominance.  Calcified granuloma is seen in the left lower lobe.
Lungs are otherwise clear.  No pleural fluid.  Airway is
unremarkable.

Incidental imaging of the upper abdomen shows no acute findings.
No worrisome lytic or sclerotic lesions.
IMPRESSION: Suspect mild basilar predominant subpleural reticulation, which is
suggestive of mild pulmonary fibrosis.

## 2010-09-11 MED ORDER — IOHEXOL 300 MG/ML  SOLN
125.0000 mL | Freq: Once | INTRAMUSCULAR | Status: AC | PRN
  Administered 2010-09-11: 125 mL via INTRAVENOUS

## 2010-10-09 NOTE — H&P (Signed)
[name], [name]               ACCOUNT NO.:  [id number]   MEDICAL RECORD NO.:  [id number]          PATIENT TYPE:  INP   LOCATION:  1428                         FACILITY:  [facility]   PHYSICIAN:  Wilmon Arms. Corliss Skains, M.D. DATE OF BIRTH:  [birth year]   DATE OF ADMISSION:  01/23/2007  DATE OF DISCHARGE:                              HISTORY & PHYSICAL   CHIEF COMPLAINT:  Abdominal pain, nausea and vomiting   HISTORY OF PRESENT ILLNESS:  The patient is a 70 year old female, 3  months postop for colostomy takedown after a diverting colostomy.  She  had been doing well until 2 days ago when she began having abdominal  pain in lower mid abdomen.  She then developed nausea and vomiting.  She  thought she was constipated, took milk of magnesia and stool softeners.  She had a very tiny bowel movement with no relief of her symptoms.  Today she has not had a bowel movement and denies any flatus.  She  continues to be nauseated.  She feels dehydrated.   PAST MEDICAL HISTORY:  1. Hiatal hernia with gastric reflux.  2. Chronic bronchitis.  3. History endometriosis.  4. Rectocele and posterior vaginal wall prolapse.   PAST SURGICAL HISTORY:  1. Open cholecystectomy.  2. Right knee arthroscopy x2.  3. Right total knee arthroplasty.  4. Vaginal hysterectomy.  5. Right third toe surgery.  6. Rectocele repair.  7. Colostomy takedown with reversal in May 2008.   ALLERGIES:  PENICILLIN and ADHESIVE TAPE.   MEDICATIONS:  Atacand, Nexium, Advair, and albuterol p.r.n., Xanax,  meloxicam, Tylenol, and Celexa, and Elavil.   SOCIAL HISTORY:  Nonsmoker, nondrinker.   FAMILY HISTORY:  Hypertension, diabetes, coronary artery disease.   REVIEW OF SYSTEMS:  Please attached review of systems sheet.   PHYSICAL EXAMINATION:  VITAL SIGNS:  Weight 184, temperature 98.4.  GENERAL:  This is a well-developed, well-nourished female who appears  uncomfortable.  She is awake, alert, oriented.  HEENT:  EOMI.  Mucosa  seems dry.  NECK:  No mass or thyromegaly.  LUNGS:  Clear.  Normal respiratory effort.  HEART:  Regular rate and rhythm.  No murmur.  ABDOMEN:  Well-healed incision.  No sign of infections.  Distended and  tender in the lower abdomen.  No sign of hernia.  EXTREMITIES:  No  edema.   IMPRESSION:  Probable small bowel obstruction.   PLAN:  Will admit the patient to [facility] for IV hydration  and workup.  We will start with plain films and blood work.  We will  also hydrate her.  We will obtain a CT scan if needed.  We will also  place a nasogastric tube.      Wilmon Arms. Tsuei, M.D.  Electronically Signed     MKT/MEDQ  D:  01/23/2007  T:  01/25/2007  Job:  [id number]

## 2010-10-09 NOTE — Op Note (Signed)
[name], [name]               ACCOUNT NO.:  [id number]   MEDICAL RECORD NO.:  [id number]          PATIENT TYPE:  INP   LOCATION:  0001                         FACILITY:  [facility]   PHYSICIAN:  Wilmon Arms. Corliss Skains, M.D. DATE OF BIRTH:  03/12/1941   DATE OF PROCEDURE:  10/17/2006  DATE OF DISCHARGE:                               OPERATIVE REPORT   PREOPERATIVE DIAGNOSIS:  Diverting sigmoid colostomy.   POSTOPERATIVE DIAGNOSIS:  Diverting sigmoid colostomy.   PROCEDURE PERFORMED:  1. Colostomy closure.  2. Extensive adhesiolysis (60 minutes).   SURGEON:  Tsuei.   ASSISTANT:  Dr. Francina Ames.   ANESTHESIA:  General endotracheal.   INDICATIONS:  The patient is a 70 year old female who recently underwent  a complex pelvic full reconstruction.  Two days postop she showed signs  of infection and a CT scan confirmed some tear in the retroperitoneum  around the rectum and this was consistent with a probable rectal injury.  She underwent exploratory laparotomy with creation of a diverting  colostomy.  The patient has recovered well. She has been restudied and  re-examined under anesthesia and there is no sign of rectal injury.  There is no sign of rectovaginal fistula.  She presents now for  colostomy closure.   DESCRIPTION OF PROCEDURE:  The patient was brought to the operating room  and placed in the supine position on the operating room table.  After an  adequate level of general anesthesia was obtained, the patient's abdomen  was prepped with Betadine and draped in sterile fashion.  A Foley  catheter had been placed under sterile technique.  A vertical midline  incision was made.  Dissection was carried down on the fascia with  cautery.  The fascia was opened vertically.  The peritoneum was lifted  and sharply entered.  We were able to peel the omentum away from the  anterior abdominal wall.  We extended our incision superiorly and  inferiorly to provide adequate exposure.  The  omentum seemed densely  adherent down into the pelvis.  Meticulous dissection was used to  mobilize the omentum.  It then became obvious that the small bowel was  densely adherent down into the pelvis.  Meticulous adhesiolysis was then  performed to mobilize the small bowel out of the pelvis.  Once this was  complete, the Balfour retractor was inserted and the small bowel was  packed away in the upper part of the abdomen and held with the [facility]  retractor extender.  The colostomy had previously been closed with a  pursestring #2-0 silk suture.  We were able to identify the proximal and  descending colon heading up to the colostomy site.  The distal stump was  identified adherent down in the left lower quadrant.  This was carefully  mobilized.  The staple line was amputated and we examined the lumen with  the colon.  This appeared clean and with no sign of obstruction.  The  colostomy site was then incised in an elliptical fashion with the knife.  Dissection was carried down in the subcutaneous tissues with cautery.  We continued this  dissection down to the fascia.  We were able to  dissect next to the colon down into the peritoneal cavity.  The colon  was mobilized with significant difficulty but we were finally able to  get it all freed into the abdomen.  The two ends of the colon reached  with no tension.  We amputated the distal 2 inches of the descending  colon.  Traction sutures of #2-0 silk were placed on each side of the  anastomosis.  We then created a single layer hand sewn anastomosis with  #2-0 silks.  The posterior layer was approximated with a horizontal  mattress suture.  Multiple interrupted silks were then used to complete  the posterior layer.  The anterior layer was closed with severe Gambee  sutures of #2-0 silk.  Once the anastomosis was complete it was  examined.  It was patent.  No bleeding was noted.  The pelvis was then  thoroughly irrigated with saline.  Her  gloves were changed.  The fascia  was closed at the old colostomy site with a #1 PDS suture.  A #1 PDS  suture was used to reapproximate the fascia.  The subcutaneous tissues  were irrigated at both wounds.  Skin staples were used to close the  skin.  A dry dressing was applied.  The patient was extubated and  brought to the recovery room in stable condition.  All sponge and  instrument and needle counts correct.      Wilmon Arms. Tsuei, M.D.  Electronically Signed     MKT/MEDQ  D:  10/17/2006  T:  10/17/2006  Job:  [id number]   cc:   Roselee Nova, Dr.

## 2010-10-09 NOTE — Op Note (Signed)
[name], [name]               ACCOUNT NO.:  [id number]   MEDICAL RECORD NO.:  [id number]          PATIENT TYPE:  INP   LOCATION:  2550                         FACILITY:  [facility]   PHYSICIAN:  Mila Homer. Sherlean Foot, M.D. DATE OF BIRTH:  15-Nov-1940   DATE OF PROCEDURE:  04/27/2007  DATE OF DISCHARGE:                               OPERATIVE REPORT   SURGEON:  Mila Homer. Sherlean Foot, M.D.   ASSISTANT:  Arnoldo Morale, PA   ANESTHESIA:  General.   PREOPERATIVE DIAGNOSIS:  Left knee osteoarthritis.   POSTOPERATIVE DIAGNOSIS:  Left knee osteoarthritis.   PROCEDURE:  Left total knee arthroplasty.   INDICATIONS FOR PROCEDURE:  The patient is a 70 year old white female  with failure of conservative measures for osteoarthritis of left knee.  Informed consent obtained.   PROCEDURE:  The patient was laid supine and administered general  anesthesia.  Foley catheter placement.  Left leg was then prepped and  draped in usual sterile fashion.  The extremity was exsanguinated with  Esmarch tourniquet inflated to 350 mmHg.  A #10 blade was used to make a  midline incision.  New blade was used to make a median parapatellar  arthrotomy and perform synovectomy.  The deep MCL was then elevated off  the medial crest of the tibia around the semimembranosus tendon.  Patella was everted, measured 22 mm thick.  I used the patellar reamer  reamed down to 9 mm.  I then drilled three lug holes through the 32-mm  template and with the 32 trial in place, recreated the 22-mm thickness.  I then took the trial off and went into flexion.  I used extramedullary  alignment system on the tibia make a perpendicular cut to the anatomic  axis of tibia removing 2 mm of bone off the medial side which was the  low side.  I used the intramedullary system make a distal femoral cut in  6 degrees of valgus angle with an intramedullary system.  I then marked  out the epicondylar axis and posterior condylar angle measured 3  degrees.  I  sized to size E and pinned to 3 degrees external rotation  holes.  I then placed four in one cutting block into place, screwed it  into place, made the anterior, posterior and chamfer cuts with a  sagittal saw.  I then placed a lamina spreader in the knee.  I removed  the ACL, PCL, medial and lateral menisci and posterior condylar  osteophytes.  I then placed 10 mm spacer block in the knee and had  excellent flexion/extension gap balance.  I then finished the tibia with  a size 3 tibial tray drill and keel and finished the femur with a size E  finishing block.  I then trialed with a size E femur, size 3 tibia, 10  insert, and 32 patella.  I had good flexion and extension gap balance,  good patellar tracking.  I removed the trial components, copiously  irrigated.  I then cemented in components and removed excess cement and  let the cement harden in extension.  I then obtained hemostasis after  I  let tourniquet down at 46 minutes.  I then irrigated again, left a  Hemovac coming out superolateral and deep to the arthrotomy and pain  catheter coming out superomedially and superficial to the arthrotomy.  I  closed the arthrotomy with figure-of-eight #1 Vicryl sutures, buried 0  Vicryl sutures in the deep soft tissues and then a subcuticular 2-0  stitch.  I then placed skin staples.  I then dressed with Xeroform  dressing sponges, sterile Webril TED stocking.   COMPLICATIONS:  None.   DRAINS:  One Hemovac, one pain catheter.   ESTIMATED BLOOD LOSS:  300 mL.   TOURNIQUET TIME:  46 minutes.           ______________________________  Mila Homer Sherlean Foot, M.D.     SDL/MEDQ  D:  04/27/2007  T:  04/27/2007  Job:  [id number]

## 2010-10-09 NOTE — Op Note (Signed)
[name], [name]               ACCOUNT NO.:  [id number]   MEDICAL RECORD NO.:  [id number]          PATIENT TYPE:  INP   LOCATION:  0001                         [facility]ACILITY:  [facility]   PHYSICIAN:  Wilmon Arms. Corliss Skains, M.D. DATE OF BIRTH:  [birth year]   DATE OF PROCEDURE:  04/06/2008  DATE OF DISCHARGE:                               OPERATIVE REPORT   PREOPERATIVE DIAGNOSIS:  Ventral incisional hernia.   POSTOPERATIVE DIAGNOSIS:  Ventral incisional hernia.   PROCEDURE PERFORMED:  Ventral hernia repair with mesh.   SURGEON:  Wilmon Arms. Corliss Skains, M.D., FACS   ANESTHESIA:  General endotracheal.   INDICATIONS:  The patient is a 70 year old female who is about 18 months  postop from a diverting colostomy after a rectal injury with pelvic  surgery.  She had her colostomy reversed and has been doing well.  She  developed an incisional hernia at her old colostomy site and has had  intermittent obstruction.  She presented to the office with some nausea  and vomiting.  At that time she refused hospital admission.  The hernia  was reducible.  I scheduled her for an urgent procedure today.   DESCRIPTION OF PROCEDURE:  The patient was brought to the operating room  and placed in the supine position on the operating table.  After an  adequate level of general anesthesia was obtained a Foley catheter was  placed under sterile technique.  The patient's abdomen was prepped with  Betadine and draped in sterile fashion.  She was tilted slightly to her  right.  Time-out was taken to assure the proper patient and proper  procedure.  I made an elliptical incision around her previous colostomy  scar in her left lower quadrant.  The skin was removed and discarded.  Dissection was carried down into subcutaneous tissues with cautery.  I  dissected down to the hernia sac.  We cleared the fascia  circumferentially around the hernia sac.  The hernia sac was mobilized  and was opened.  This contained only omentum at  this time.  The omentum  was dissected free and was reduced back into the peritoneal cavity.  The  hernia sac was excised.  The fascia was cleared in all directions for  several centimeters.  A medium Proceed ventral hernia patch was inserted  into the hernia defect and was secured with interrupted zero Novofil  sutures.  These were all tied down.  The mesh tapes were removed.  The  fascia was then reapproximated in a vertical fashion with interrupted  zero Novofil sutures.  The subcutaneous tissues were irrigated and  closed with 3-0 Vicryl.  Monocryl 4-0 was used to close skin incisions.  Steri-Strips and clean dressings were applied.  The patient was then  extubated and brought to the recovery room in stable condition.  All  sponge, instrument and needle counts were correct.      Wilmon Arms. Tsuei, M.D.  Electronically Signed    MKT/MEDQ  D:  04/06/2008  T:  04/06/2008  Job:  [id number]

## 2010-10-09 NOTE — Op Note (Signed)
NAMEBRIANNI, MANTHE               ACCOUNT NO.:  [id number]   MEDICAL RECORD NO.:  [id number]          PATIENT TYPE:  AMB   LOCATION:  DAY                          FACILITY:  [facility]   PHYSICIAN:  Wilmon Arms. Corliss Skains, M.D. 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   POSTOPERATIVE DIAGNOSIS:  Rectal injury, status post diverting  colostomy.  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   PROCEDURE PERFORMED:  1. Examination under anesthesia.  2. Anal dilatation.   SURGEON:  Wilmon Arms. Tsuei, M.D.   COSURGEON:  Martina Sinner, MD   ANESTHESIA:  General endotracheal.   INDICATIONS:  The patient is a 70 year old female who three months ago  underwent a complex pelvic reconstruction with a vault suspension,  rectocele repair with graft by Dr. Sherron Monday.  She developed signs of  infection and further investigation noted probable rectal injury.  She  underwent of exploratory laparotomy and diverting sigmoid colostomy.  The patient has done well since that time.  However, in preparation for  her colostomy closure, the patient mentioned that she was having vaginal  discharge.  Pelvic examination showed an area of granulation tissue in  the posterior vagina.  This area was cauterized with silver nitrate by  Dr. Sherron Monday.  A contrast study through the rectum showed no sign of  rectovaginal fistula.  However, due to the persistent drainage and the  area of granulation, the concern for a possible rectovaginal fistula was  heightened.  This area is fairly low and was thought that the contrast  enema could of missed this place.  In preparation for the colostomy  closure, we decided bring her to the operating room for examination  under anesthesia.   DESCRIPTION OF PROCEDURE:  The patient was  brought to the operating room  and placed in the supine position on operating table.  After an adequate  level of general anesthesia was obtained, the patient's perineum and  vagina was prepped with Betadine and draped in sterile fashion.  A time-  out was taken assure proper patient, proper procedure.  The vaginal  examination showed no sign of inflammation.  A small dimple was felt on  the posterior vaginal wall which showed no sign of a lumen or  granulation tissue.  Dr. Sherron Monday confirmed that this was the spot  where he had seen the granulation tissue previously.  I inserted a clean  finger into the rectum and I was able palpate the rectovaginal septum at  this area.  It fell fairly thick where the dermal graft has firmly  taken.  The rectal wall was smooth with no sign of dimple or granulation  tissue.  The anus was quite stenotic.  I held gentle pressure on the  sphincter and was able to dilate it up to two fingers loosely.  I was  able to insert a silver bullet retractor.  We were able to visualize the  anterior rectal wall.  Dr. Jacquelyne Balint palpated the posterior vaginal wall  and there was no  sign of any mucosal discontinuity and this area in the rectum.  The area  was irrigated.  The retractors were removed.  The patient was extubated  and brought to recovery in stable condition.  All sponge, instrument,  needle counts correct.  The plan is to proceed with colostomy closure  next week.      Wilmon Arms. Tsuei, M.D.  Electronically Signed     MKT/MEDQ  D:  10/09/2006  T:  10/09/2006  Job:  [id number]

## 2010-10-09 NOTE — Op Note (Signed)
[name], [name]               ACCOUNT NO.:  [id number]   MEDICAL RECORD NO.:  [id number]          PATIENT TYPE:  AMB   LOCATION:  DAY                          FACILITY:  [facility]   PHYSICIAN:  Wilmon Arms. Corliss Skains, M.D. DATE OF BIRTH:  18-Oct-1940   DATE OF PROCEDURE:  10/09/2006  DATE OF DISCHARGE:                               OPERATIVE REPORT   PREOPERATIVE DIAGNOSES:  Vaginal discharge, possible rectovaginal  fistula.   POSTOPERATIVE DIAGNOSES:  No vaginal fistula; mild anal stenosis.   SURGEON:  Wilmon Arms. Corliss Skains, M.D.   ASSISTANT:  Martina Sinner, MD   This is a progress note for an examination under anesthesia that will be  primarily dictated by Dr. Wilmon Arms. Tsuei.   [name] is a patient of mine who unfortunately had a rectal leak  following vault suspension and posterior repair.  She was having dark  bloody vaginal discharge and she had a little bit of granulation tissue.  The granulation tissue was fulgurated.  She reported to myself and Dr.  Corliss Skains that she did not have any discharge since I fulgurated the area  approximately 10 days ago.  Just to be absolutely sure, I thought it was  still best that we examine her under anesthesia to make certain that  there was not a fistula since this would cause obvious significant  problems when her colostomy was taken down.   The patient was prepped and draped in the usual fashion.  I initially  did a vaginal exam using two right angle Heaney retractors.  Vaginal  vault was well supported.  There was a little bit of a dimple at her  left vaginal apex which is not surprising.  The granulation tissue that  I fulgurated was no longer present and you could see a little bit of a  red blush in the mucosa which would have represented its base.  We did a  bimanual digital rectal examination with vaginal examination and there  was a nice thick rectal and posterior vaginal wall palpable.  You could  not feel dimples, stitch or  retraction of tissue.   Dr. Corliss Skains went on to do an anal dilation.  We had a really good look  inside the rectal pouch.  We also had a simultaneously good look in the  vagina.  We were both confident there was not a fistula.  The tissues  were very soft, supple, the thick and healthy.   I was very delighted with the findings.  [name] is going to proceed  with reversal of her colostomy next Friday.  I hope this operation  reaches her treatment goal.   I spoke to her daughter afterwards.  She uses some pain medicine most  days.  She has good days and bad days in terms of the amount of pain  medicine she has.  I think that her sadness and/or depression that has  been aggravated by this situation compounds her intake of analgesics.  I  am trying to support her the best I can in this regard until she gets  over her postoperative  course.     ______________________________  Martina Sinner, MD      Wilmon Arms. Tsuei, M.D.  Electronically Signed    SAM/MEDQ  D:  10/09/2006  T:  10/09/2006  Job:  [id number]

## 2010-10-09 NOTE — Discharge Summary (Signed)
Nicole, [name] NO.:  [id number]   MEDICAL RECORD NO.:  [id number]          PATIENT TYPE:  INP   LOCATION:  1604                         FACILITY:  [facility]   PHYSICIAN:  Ardeth Sportsman, MD     DATE OF BIRTH:  [birth year]   DATE OF ADMISSION:  10/17/2006  DATE OF DISCHARGE:  10/22/2006                               DISCHARGE SUMMARY   PRIMARY CARE PHYSICIAN:  Georgann Housekeeper, MD at [facility].   UROLOGIST:  Martina Sinner, MD   SURGEON:  Wilmon Arms. Corliss Skains, MD   DISCHARGE DIAGNOSES:  1. Colostomy, status post rectal injury, February 2008.  2. Bronchitis.  3. Hypertension.  4. Gastroesophageal reflux disease with known paraesophageal hiatal      hernia.  5. Endometriosis.  6. Status post open cholecystectomy.  7. Status post right knee arthroscopy x2.  8. Status post vaginal hysterectomy.  9. Status post nasal septal surgery.  10.Right third toe surgery.   ALLERGIES:  PENICILLIN and CERTAIN TAPES, although will tolerate paper  tape fine.   PROCEDURE PERFORMED:  Lysis of adhesions and colostomy takedown on Oct 17, 2006 by Dr. Corliss Skains.   SUMMARY OF HOSPITAL COURSE:  [name] is a 70 year old female who  unfortunately had a rectal injury during a rectocele repair that  required diverting colostomy 3 months ago.  She recovered from that and  came in and had her colostomy taken down.  Postoperatively, she did have  some low-grade intermittent fevers.  She had a negative chest x-ray,  negative urinalysis and urine culture is negative at the time of  discharge.  She advanced her diet and was tolerating a solid diet with  flatus at the time of discharge.   She was walking the hallways with adequate pain control on oral  medications.  She denied any significant nausea or vomiting.  Her white  count remained normal and her electrolytes were appropriate.  She was  diuresed and was weaned back down to room air.   She was complaining  of a little bit of odynophagia and found a little  erythema in her mouth and she was given a dose of oral fluconazole x1  and some Magic Mouthwash with improvement.  Her incision had a little  slight erythema in the upper 3 cm superiorly and the lower 3 cm  inferiorly, but no definite opening or dehiscence so she was started on  some oral Bactrim on the day of discharge to cover some early  cellulitis.   DISCHARGE INSTRUCTIONS:  Given these improvements, we thought that she  could be discharged with the following instructions:  1. She is to return to clinic to see Dr. Corliss Skains in about 7-10 days to      have her staples removed.  2. She should take Bactrim DS one p.o. b.i.d. for a week and also use      triple antibiotic twice a day on her incision to help minimize any      development of worsening cellulitis or wound infection.  Bactrim  will also help for any possible urinary tract infection, which she      claims she has had in the past, although there has been no evidence      of this in the computer for the past 2 years.  3. She is to shower every day and keep her incision clean and dry,      call if she has worsening drainage, pain or other issues.  4. She can walk up to 30 minutes a day total.  Given her history of      some balance issues in the past, we will have Home Health Physical      Therapy see her for a home visit to make sure she is safe.  She      tells me that her daughter and other family will be able to help      take care of her at home, so I think it is reasonable for her to be      discharged home.   DISCHARGE MEDICATIONS:  She can resume her home medications, which  include:  1. Atacand/hydrochlorothiazide 16/12.5 p.o. daily q.a.m.  2. Nexium 40 mg q.a.m.  3. Advair and albuterol as needed for shortness of breath.  4. Alprazolam 0.5 mg nightly.  5. Meloxicam 15 mg q.a.m.  6. Vicodin 5/500 one to two p.o. q.4 h. p.r.n. pain.  7. Ibuprofen 200 mg to 600 mg  q.6 h. p.r.n. pain.  8. She should limit that just for the first 2 weeks, given her history      of reflux disease.  9. Tylenol 650 mg p.o. q.6 h. p.r.n. pain.  10.Celexa 40 mg p.o. nightly.  11.Amitriptyline 25 mg p.o. nightly.      Ardeth Sportsman, MD  Electronically Signed     SCG/MEDQ  D:  10/22/2006  T:  10/22/2006  Job:  [id number]   cc:   Georgann Housekeeper, MD  Fax: [phone]   Leighton Roach McDiarmid, M.D.

## 2010-10-12 NOTE — Op Note (Signed)
[name], [name]               ACCOUNT NO.:  [id number]   MEDICAL RECORD NO.:  [id number]          PATIENT TYPE:  INP   LOCATION:  1414                         FACILITY:  [facility]   PHYSICIAN:  Wilmon Arms. Corliss Skains, M.D. DATE OF BIRTH:  [birth year]   DATE OF PROCEDURE:  07/18/2006  DATE OF DISCHARGE:                               OPERATIVE REPORT   PREOP DIAGNOSIS:  Rectal leak.   POSTOPERATIVE DIAGNOSIS:  Rectal leak.   PROCEDURE PERFORMED:  1. Exploratory laparotomy.  2. Extensive lysis of adhesions (60 minutes).  3. Sigmoid colostomy with Hartmann's pouch.   SURGEON:  Wilmon Arms. Corliss Skains, M.D.  and Martina Sinner, MD   ASSISTANT:  Cornelious Bryant, MD   ANESTHESIA:  General endotracheal.   INDICATIONS:  The patient is a 70 year old female who is 3-days postop  from repair of a rectocele with dermal graft by Dr. Sherron Monday.  Postoperatively she developed some mild lower abdominal pain.  She then  progressed to having fever and increased white count.  A chest x-ray  showed evidence of free intraabdominal air.  A CT scan was obtained  which showed a leak in the rectum with extensive free air tracking up in  the retroperitoneal tissues.  We were then consulted to assist with her  management.   DESCRIPTION OF PROCEDURE:  The patient was brought to the operating  room; and then placed in the supine position on the operating table.  After an adequate level of general anesthesia was obtained, the  patient's legs were placed in yellow fin stirrups with appropriate  padding and positioning.  Dr. Sherron Monday performed a thorough vaginal  examination.  His repair appeared intact with no sign of swelling,  hematoma, or infection.  Digital rectal examination showed some loose  yellow stool in the rectal vault.  There was some thickening anteriorly  approximately 8-9 cm from the anal verge.  The patient's legs were then  lowered; and her abdomen was prepped with Betadine, and draped in  sterile fashion.  A time-out was taken and showed the proper patient and  proper procedure.   A lower midline incision was made from the umbilicus down to the  symphysis pubis.  Dissection was carried down through the subcutaneous  tissues to the fascia which was opened vertically.  A Balfour retractor  was inserted.  The omentum was densely adherent down into the pelvis.  This was mobilized up.  There were several loops of small bowel which  were extensively adhered deep into the pelvis. This took approximately  60 minutes to completely mobilize these loops of small bowel up out of  the pelvis.  We then were able to run the pelvis, in its entirety from  the cecum, retrograde to the ligament of Treitz.  No injuries were  noted.   Small bowel and omentum were packed away in the upper abdomen.  We then  inspected the pelvis.  There were pockets of air seen in the  retroperitoneal tissues as well as in some of the small bowel mesentery.  However, there is no gross contamination or abscess noted.  Some of the  tissues in the anterior rectum were fairly thickened where it was  adherent to the posterior vaginal wall.  We did not dissect into this  area.  A #19 Blake drain was brought through a stab incision, in the  right lower quadrant; and the drain was placed down into the pelvis.  The pelvis was then thoroughly irrigated with saline.   The sigmoid colon was then mobilized from the lateral abdominal wall.  This was divided with a GIA-75 in the midportion of the sigmoid colon.  Some the mesentery was taken back proximally with Kelly clamps and 2-0  silk ties.  A circle of skin was removed from the left lower quadrant.  The fascia was opened to allow three fingers to pass easily.  The  descending sigmoid colostomy was then brought out through the ostomy  site.  We once, again, irrigated the abdomen.  All sponge counts were  correct.  The fascia was then closed with double-stranded #1 PDS  suture.  The subcutaneous tissues were irrigated; and then skin staples were used  to close the skin.  The staple line was then isolated with a green  towel.  The staple line was removed from the descending colostomy; and  the colostomy was matured with interrupted 3-0 Vicryl sutures.  An  ostomy appliance was cut to fit; and was attached to the patient.  The  patient was then extubated, and brought to recovery in stable condition.  All sponge, instrument, and needle counts were correct.      Wilmon Arms. Tsuei, M.D.  Electronically Signed     MKT/MEDQ  D:  07/18/2006  T:  07/18/2006  Job:  [id number]   cc:   Leighton Roach McDiarmid, M.D.

## 2010-10-12 NOTE — Discharge Summary (Signed)
NAMELAKEESHA, [name]               ACCOUNT NO.:  [id number]   MEDICAL RECORD NO.:  [id number]          PATIENT TYPE:  INP   LOCATION:  1414                         [facility]ACILITY:  [facility]   PHYSICIAN:  [name], M.D. DATE OF BIRTH:  1940/10/31   DATE OF ADMISSION:  07/15/2006  DATE OF DISCHARGE:  07/24/2006                               DISCHARGE SUMMARY   ADMISSION DIAGNOSIS:  Rectocele.   DISCHARGE DIAGNOSES:  1. Rectocele status post repair.  2. Rectal perforation.   PROCEDURES PERFORMED:  Rectocele repair and exploratory laparotomy with  sigmoid colostomy and Hartmann's procedure.   BRIEF HISTORY:  The patient is a 70 year old female who presented with a  rectocele.  She underwent a rectocele repair by [name] with a  Dermagraft on July 15, 2006.  She was kept in the hospital overnight  because of postoperative fever.  She was admitted to the hospital where  she was noted to have a normal white blood cell count.  Her abdominal  pain began on postop day #1 and was localized in the right lower  quadrant.  She then underwent a workup which resulted in a decreasing  white blood cell count.  She was on antibiotics at this time.  She was  then sent for a CT scan of the abdomen and pelvis with rectal contrast.  This showed free atrium abdominal air as well as a leak of rectal  contrast.  We were then consulted emergently.  She was brought to the  operating room where she underwent a vaginal examination under  anesthesia.  The surgical site appeared clean with no sign of infection  or hematoma.  She then underwent exploratory laparotomy.  She was noted  to have air in the retroperitoneum.  There was no interperitoneal  contamination.  A drain was placed deep in the pelvis.  She underwent  extensive adhesiolysis due to small bowel adhesions from her previous  hysterectomy.  She then had a diverting colostomy and Hartmann's pouch  created.  The patient has done well  postoperatively.  She did have  intermittent postoperative fevers, but her white blood cell count  returned to normal.  She has been maintained on antibiotics.  She had a  superficial wound infection which was opened and is being treated with  dressing changes.  She has been educated on ostomy care.   DISCHARGE INSTRUCTIONS:  Home Health has been set up for ostomy care and  wound dressing changes.   DISCHARGE MEDICATIONS:  She is being discharged on ciprofloxacin and  Flagyl as well as pain medication.   DISCHARGE FOLLOWUP:  Follow up in 1 week with [name].  Follow up in 2  weeks with [name].   DISCHARGE ACTIVITIES:  She should refrain from any heavy lifting.      Nicole Arms. Tsuei, M.D.  Electronically Signed     MKT/MEDQ  D:  07/24/2006  T:  07/24/2006  Job:  [id number]   cc:   Leighton Roach [name], M.D.

## 2010-10-12 NOTE — Discharge Summary (Signed)
NAMEMADISYNN, PLAIR               ACCOUNT NO.:  [id number]   MEDICAL RECORD NO.:  [id number]          PATIENT TYPE:  INP   LOCATION:  5007                         FACILITY:  [facility]   PHYSICIAN:  Mila Homer. Sherlean Foot, M.D. DATE OF BIRTH:  09-21-1940   DATE OF ADMISSION:  04/27/2007  DATE OF DISCHARGE:  04/30/2007                               DISCHARGE SUMMARY   ADMISSION DIAGNOSIS:  Osteoarthritis of left knee.   DISCHARGE DIAGNOSES:  1. Osteoarthritis of left knee.  2. Hypertension.  3. Gastroesophageal reflux disease.  4. Chronic bronchitis.  5. Depression.  6. Cardiomegaly.  7. Hypokalemia.  8. History of tobacco use.   PROCEDURE:  Left total knee arthroplasty.   HISTORY:  A 70 year old female with left knee pain for 3 to 5 years.  Her pain has worsened over the past year.  She does have mechanical  symptoms of catching and locking and painful popping.  The pain is a  constant and dull, aching pain in the anterior aspect of the knee with  radiation to the distal tibia.  Not using any cane or assistive devices.  X-rays revealed bone-on-bone and medial compartment OA.  She also had  tricompartmental changes.  Admitted at this time for total knee  arthroplasty.   HOSPITAL COURSE:  A 70 year old female admitted on April 27, 2007  after appropriate laboratory studies were obtained as well as 600 mg of  clindamycin IV on call to the operating room.  She was taken to the  operating room where she underwent a left total knee arthroplasty by Dr.  Georgena Spurling.  Assisted by Arnoldo Morale, P.A.-C.  She tolerated the  procedure well.  She was continued on vancomycin 1 gram IV q.12 hours  times one more dose.  A Dilaudid PCA pump was started for pain  management at a reduced dose.  Lovenox 30 mg subcutaneous q.12 hours was  started on April 28, 2007 at 8 a.m.  __________  CPM 0 to 90 degrees  for 6 to 8 hours per day.  Consults to PT, OT and Care Management were  made.  Ambulation  with weightbearing as tolerated.  Stat H&H was ordered  on December 1 at 4 o'clock in the afternoon.  She was typed and crossed  and transfused 1 unit of packed cells on December 1.  She was allowed  out of bed to chair the following day.  Weaned off her PCA and she was  instructed in Lovenox teaching.  A Doppler was ordered on December 3 to  rule out DVT.  AP and lateral x-rays of the left knee were ordered  status post postoperative fall.  She developed hypokalemia and was  placed on K-Dur 40  mEq p.o. times one.  She had improvement in her  ambulation and her temperature remained below 99 degrees.  She was  discharged on December 4 to return back to the office in followup.  Noninvasive Dopplers revealed no obvious DVT.  Left knee revealed left  total knee arthroplasty without immediate complications.   Laboratory studies revealed hemoglobin 10.7, hematocrit 32.0%, white  count  5600, platelets 292,000.  Her hemoglobin was 9.4, hematocrit 27.9  postoperatively initially.  Discharge hemoglobin 9.0, hematocrit 26.1%,  white count 7000, platelets 211,000.  Chemistries:  Sodium 136,  potassium 3.7, chloride 100, CO2 28, glucose 99, BUN 10, creatinine  1.46, GFR was 36, total protein 7.7, albumin 3.7, AST 26, ALT 22, ALP  68, total bilirubin 0.7.  Discharge sodium 137, potassium 3.8, chloride  104, CO2 26, glucose 90, BUN 10, creatinine 1.33, GFR 40.  Potassium was  low at 3.4 on December 3.  Urinalysis was benign for voided urine.  Blood type AB positive.  Antibody screen negative.  Given one unit of  packed cells during hospital course.  No growth on urine.   DISCHARGE INSTRUCTIONS:  No restrictions in her diet.  Increase  activities as tolerated.  May shower on Friday.  No lifting or driving  for 6 weeks.  Follow wound instruction sheet.  Change dressing daily.  Keep incision clean and dry.  Weightbearing as tolerated.  CPM of 0 to 9  degrees for 6 to 8 hours per day.  Percocet 5/325 one  to two every 4  hours as needed for pain.  Robaxin 500 mg one tablet every 6 to 8 hours  as needed for spasms.  Lovenox 40 mg injected at 8 a.m. daily as taught.  Follow up with Dr. Sherlean Foot on May 12, 2007.  Discharged in improved  condition.      Oris Drone Petrarca, P.A.-C.    ______________________________  Mila Homer. Sherlean Foot, M.D.    BDP/MEDQ  D:  06/15/2007  T:  06/15/2007  Job:  [id number]

## 2010-10-12 NOTE — H&P (Signed)
[place]  Patient:    [name], [name]                      MRN: [id number] Adm. Date:  [id number] Attending:  Georgena Spurling Dictator:   Arnoldo Morale, P.A.                         History and Physical  DATE OF BIRTH:  April 21, 1941  CHIEF COMPLAINT:  Right knee pain since a knee replacement - November 21, 1999.  HISTORY OF PRESENT ILLNESS:  This 70 year old white female patient of Dr. Fernande Boyden presented to him with a history of a right total knee arthroplasty at [facility] on November 21, 1999.  She subsequently developed difficulty with range of motion, and underwent a knee manipulation and possibly a debridement of the knee in August 2001.  She has had continued pain in that right knee since that time, and her motion of that knee is markedly decreased.  The pain at this time is constant and described as a dull type of pain located over the posterior and anterior medial aspect of the knee without radiation.  It increases with any walking and especially flexion of the knee. It decreases with rest and elevation.  There is swelling of the knee and it does grind.  She has had no fall or injury to the knee.  She is currently taking Ultram and Vicodin for pain and that provides just a minimal amount of relief.  ALLERGIES: 1. AMPICILLIN causes itching and pain. 2. BANDAGE ADHESIVE caused blisters.  CURRENT MEDICATIONS: 1. Premarin 1.25 mg one tablet p.o. q.d. 2. Prilosec 20 mg one tablet p.o. q.d. 3. Ultram 50 mg one tablet p.o. b.i.d. 4. Amitriptyline 10 mg one tablet p.o. q.h.s. 5. Ventolin inhaler two puffs q.4h. p.r.n. difficulty breathing.  PAST MEDICAL HISTORY:  She does have a history of a hiatal hernia with gastroesophageal reflux disease for the last four to five years.  She has a history of chronic bronchitis for 10-15 years, and also endometriosis.  She did have a hysterectomy due to the endometriosis.  She denies any history  of diabetes mellitus, hypertension, thyroid disease, peptic ulcer disease, heart disease, asthma, or any other chronic medical condition other than noted previously.  PAST SURGICAL HISTORY: 1. Open cholecystectomy in 1980. 2. Right knee arthroscopy by Dr. Theresia Lo in 2000. 3. Right knee arthroscopy by Dr. Theresia Lo in 2001. 4. Hysterectomy by Dr. Greggory Keen in 1998. 5. Right total knee arthroplasty by Dr. Theresia Lo in 2001. 6. Surgery on her septum of her nose in the 1980s. 7. Artificial joint placed in the right third toe in 1999.  SOCIAL HISTORY:  She is a 60 pack-year history of cigarette smoking.  She is currently smoking but she resumed smoking about five years ago after quitting for five years.  She reports she just puffs on the cigarettes and does not really inhale.  She does not drink any alcohol nor use any drugs.  She is a widow and has three children, aged 76, 37, and 42.  She currently lives with her daughter in a one-story house with one step into the main entrance.  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  MEDICAL DOCTOR:  Dr. Maryellen Pile in [place].  FAMILY HISTORY:  Her mother is alive at age 73 with a  history of hypertension and heart disease.  Her father died at the age of 26 with myocardial infarction.  She has two brothers who are alive, age 42 and 52, and the 69 year old does have a history of heart disease.  She did have one brother die at age 71 due to heart disease.  She has two sisters, one age 44 with heart disease, hypertension and a history of breast cancer; and one age 44 with a history of heart disease, hypertension, and a stroke.  REVIEW OF SYSTEMS:  She does wear dentures on both the upper and lower jaw line.  She does have a contact lens on her right.  She does have a history of retinal detachment and she is blind in her left eye.  She does have occasional headaches when she is upset.   Complains of occasional sinus congestion and constant cold feeling.  All other systems are negative and not contributory. She does have a "Living Will."  PHYSICAL EXAMINATION:  GENERAL:  Well-developed, well-nourished white female in no acute distress. Talks easily with the examiner.  Walks with a limp on the right.  Mood and affect are appropriate.  Height 5 feet 5 inches, weight 180 pounds.  VITAL SIGNS:  Temperature 98 degrees Fahrenheit, pulse 72, respirations 20, blood pressure 136/86.  HEENT:  Normocephalic, atraumatic without frontal or maxillary sinus tenderness to palpation.  Conjunctivae pink, sclerae anicteric.  PERRLA.  EOMs intact.  Funduscopic exam shows visible red reflex bilaterally with normal retinal vasculature, optic disk, and cup.  No visible external ear deformities.  Hearing grossly intact.  Tympanic membranes pearly gray bilaterally with good light reflex.  Nose and nasal septum midline.  Nasal mucosa pink and moist without exudates or polyps noted.  Buccal mucosa pink and moist.  Good dentition.  Pharynx without erythema or exudates.  Tongue and uvula midline.  Tongue without vesiculations and uvula rises equally with phonation.  NECK:  No visible masses or lesions noted.  Trachea midline.  No palpable lymphadenopathy nor thyromegaly.  Carotids +2 bilaterally without bruits. Full range of motion and nontender to palpation along the entire length of the cervical spine.  CARDIOVASCULAR:  Heart rate and rhythm regular.  S1, S2 present without rubs, clicks, or murmurs noted.  RESPIRATORY:  Respirations even and unlabored.  Breath sounds clear to auscultation bilaterally without rales or wheezes noted.  ABDOMEN:  She does have a well-healed right upper quadrant abdominal scar and a low suprapubic scar.  She has slight diffuse abdominal pain with palpation. No one spot is more tender than another.  No rebound or guarding.  Bowel sounds are present x 4  quadrants.  Femoral pulses are +2 bilaterally.  No hepatosplenomegaly nor CVA tenderness.  BREAST/GENITOURINARY/RECTAL/PELVIC:  These exams deferred at this time.   MUSCULOSKELETAL:  No obvious deformities of her bilateral upper extremities, with full range of motion of these extremities without pain. Radial pulses are +2 bilaterally.  She has full range of motion of her hips, ankles, and toes bilaterally.  DP and PT pulses are +2.  Her left knee has full extension and is able to flex to about 145 degrees. There is no effusion or deformity noted.  She has no pain with palpation along the joint line, either medially or laterally.  Her right knee has a well-healed midline incision.  Skin is otherwise intact.  She can only flex the right knee to about 85 degrees and she is lacking possibly 5 degrees of full extension.  She  has pain with palpation of both medial and lateral joint line and pain with range of motion.  There is a small effusion in the knee, and crepitus with range of motion of the knee.  Collateral ligaments are otherwise intact.  NEUROLOGIC:  Alert and oriented x 3.  Cranial nerves 2-12 are grossly intact. Strength 5/5 bilateral upper and lower extremities.  Rapid alternating movements intact.  Deep tendon reflexes 2+ bilateral upper and lower extremities.  Sensation intact to light touch.  RADIOLOGIC FINDINGS:  X-rays taken in September 2001 in our office show no evidence of malposition of the prosthesis.  The lateral view, however, does show a spur on the back of the femoral condyle.  This may be blocking flexion.  IMPRESSION: 1. Painful right total knee, with spur possibly blocking flexion. 2. Chronic bronchitis. 3. Gastroesophageal reflux disease. 4. Hiatal hernia. 5. Retinal detachment, left eye, with blindness. 6. History of endometriosis.  PLAN:  Ms. Lastinger will be admitted to [facility] on April 21, 2000 where she will undergo a revision of her  right total knee arthroplasty by Dr. Mila Homer. [place].  She will undergo all the routine preoperative laboratory tests and studies prior to this procedure.  If we have any medical issues while she is hospitalized, we will contact either [facility] or the teaching service to evaluate her for these problems, since her medical doctor is in [place]. DD:  04/22/00 TD:  04/22/00 Job: [id number]

## 2010-10-12 NOTE — Consult Note (Signed)
[name], [name]               ACCOUNT NO.:  [id number]   MEDICAL RECORD NO.:  [id number]          PATIENT TYPE:  INP   LOCATION:  1414                         FACILITY:  [facility]   PHYSICIAN:  Wilmon Arms. Corliss Skains, M.D. DATE OF BIRTH:  [birth year]   DATE OF CONSULTATION:  07/18/2006  DATE OF DISCHARGE:                                 CONSULTATION   CONSULTING PHYSICIAN:  Martina Sinner, MD.   REASON FOR CONSULTATION:  Abdominal pain and free air status post  rectocele repair.   HISTORY OF PRESENT ILLNESS:  The patient is a 70 year old female who is  postop day #3 from a posterior vaginal vault and rectocele repair with a  dermal graft by Dr. Sherron Monday. The patient initially felt well after  surgery but on postoperative day #1 began having some right lower  quadrant pain. Her white count on that day was noted to be normal. She  was kept in the hospital for observation. Over the last couple of days,  her abdominal pain has worsened but still localized mainly to the right  lower quadrant. A chest x-ray on July 17, 2006 showed some free  intraperitoneal air but this was felt to be consistent with her postop  history. There was no evidence of pneumonia. The patient has been  febrile with a maximum temperature of 102.9. She is not tachycardic. Her  white count has increased to 13.6 today. We are consulted for surgical  evaluation.   PAST MEDICAL HISTORY:  1. Hiatal hernia with gastroesophageal reflux.  2. Chronic bronchitis.  3. Endometriosis.  4. Rectocele and posterior vaginal vault prolapse.   PAST SURGICAL HISTORY:  Open cholecystectomy, right knee arthroscopy x2,  right total knee arthroplasty, vaginal hysterectomy, nasal septal  surgery, right third toe surgery and rectocele repair.   ALLERGIES:  PENICILLIN and ADHESIVE TAPE.   HOME MEDICATIONS:  Actonel, albuterol inhaler, Advair, Celexa, Atacand,  HCT, Nexium, Premarin, meloxicam, p.r.n. hydrocodone, glucosamine  chondroitin, calcium, fish oil.   SOCIAL HISTORY:  The patient quit smoking 5 years ago and rare alcohol  use.   PHYSICAL EXAMINATION:  VITAL SIGNS:  Current temperature 101.5, heart  rate 77, blood pressure 106/56, respirations 18, 97% on room air.  GENERAL:  This is a well-developed, well-nourished female who appears  mildly uncomfortable. She complains mostly of feeling warm.  HEENT:  EOMI, sclera anicteric.  NECK:  No masses, no thyromegaly.  LUNGS:  Clear. Normal respiratory effort.  HEART:  Regular rate and rhythm. No murmur.  ABDOMEN:  Positive bowel sounds, mildly distended, soft, tender in the  right lower quadrant. No palpable masses. There is mild suprapubic  tenderness.  GU:  I did not perform a digital rectal examination due to her recent  surgery. There is no perineal swelling or tenderness. A Foley catheter  is in place and is draining clear yellow urine.  EXTREMITIES:  No edema.   White count 13.6, hemoglobin 10.9.   IMPRESSION:  Right lower quadrant pelvic pain postoperative day #3 from  a rectocele repair. Differential diagnosis includes possible rectal  injury versus acute appendicitis.   RECOMMENDATIONS:  Agree with IV antibiotics and n.p.o. status. Will  obtain a stat CT scan with IV and rectal Gastrografin. If she does have  a rectal injury, she will need a temporary diverting colostomy.      Wilmon Arms. Tsuei, M.D.  Electronically Signed     MKT/MEDQ  D:  07/18/2006  T:  07/18/2006  Job:  [id number]   cc:   Martina Sinner, MD  Fax: [phone]

## 2010-10-12 NOTE — Discharge Summary (Signed)
[place]. [facility]  Patient:    [name], [name]                      MRN: [id number] Adm. Date:  [id number] Disc. Date: [id number] Attending:  Georgena Spurling Dictator:   Arnoldo Morale, P.A.-C.                           Discharge Summary  ADMISSION DIAGNOSES: 1. 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 2. Chronic bronchitis. 3. Gastroesophageal reflux disease. 4. Hiatal hernia. 5. Retinal detachment with left eye blindness. 6. History of endometriosis.  DISCHARGE DIAGNOSES: 1. 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 2. Chronic bronchitis. 3. Gastroesophageal reflux disease. 4. Hiatal hernia. 5. Retinal detachment with left eye blindness. 6. History of endometriosis. 7. Posthemorrhagic anemia.  SURGICAL PROCEDURES:  On April 21, 2000, [name] underwent a revision of the right total knee arthroplasty with removal of spur and ligament balancing.  COMPLICATIONS:  None.  CONSULTS: 1. Physical therapy and rehabilitation medicine consult April 22, 2000. 2. Occupational therapy consult April 23, 2000.  HISTORY OF PRESENT ILLNESS:  This 70 year old white female presented to Dr. Sherlean Foot with a history of right total knee arthroplasty at [facility] on November 21, 1999, of this year.  She subsequently developed difficulty with range of motion and underwent a manipulation and knee arthroscopy in August 2001.  She has had continued pain in that right knee and her range of motion is markedly decreased.  The pain increases with any walking and flexion of the knee and decreases with rest and elevation.  There is swelling and grinding of the knee.  She has had no known injury to the knee.  Conservative measures are not helping her pain and x-ray have shown a possible spur blocking flexion of the knee.  She is admitted at this time for revision of her right total knee with removal of spur.  HOSPITAL COURSE:  [name]  tolerated her surgical procedure well without immediate postoperative complications.  She was subsequently transferred to [facility] [place].  On postoperative day #1, T-max was 101.1, vital signs stable.  Right knee dressing was intact without drainage.  Legs were neurovascularly intact. Her hemoglobin was 10.8 with hematocrit of 30.9.  Her pain was controlled at that time.  She was started on physical therapy per protocol.  On postoperative day #2, T-max was 102.1.  Vital signs were stable. Hemoglobin 10, hematocrit 28.6.  Right knee incision was well-approximated with staples without drainage or redness.  Hemovac was discontinued.  She was switched to p.o. pain medications.  On April 24, 2000, she was doing very well.  Her pain was controlled with p.o. medications and she wished to go home.  T-max was 100.2, vitals stable. The right knee incision was unchanged.  Hemoglobin was 9.9, hematocrit 28.8. She was believed stable enough for discharge and was discharged home later in the day.  DISCHARGE INSTRUCTIONS: 1. She is to resume all prehospitalization medications and diet except for the    Ultram. 2. She is given Lovenox 40 mg subcu q.d. for 11 days and Percocet 5 mg one to    two tablets p.o. q.4h. p.r.n. for pain, #50 with no refill. 3. She is to be out of bed and weightbearing on the right leg as tolerated    with the walker. 4. She is to continue physical therapy on her right knee.  Keep  the incision    clean and dry. 5. She is to follow up with Dr. Sherlean Foot in our office in 7-10 days and is to    call [phone] to set up that appointment. 6. She is to notify Dr. Sherlean Foot of a temperature greater than 101.5, chills,    pain unrelieved by pain medications or foul-smelling drainage from the    wound. 7. She stated understanding of these instructions and was subsequently    discharged home.  LABORATORY DATA:  On April 22, 2000, hemoglobin 10.8, hematocrit 30.9.  On April 23, 2000,  hemoglobin 10, hematocrit 28.6.  On April 24, 2000, white count 6.4, hemoglobin 9.9, hematocrit 28.8 and platelets are 218,000.  On April 22, 2000, sodium 135, potassium 3.3, glucose 129, calcium 8.1. On April 23, 2000, sodium 136, potassium 3.3, calcium 8.2.  On November 29,2001, sodium was 138, potassium 3.8, chloride 102, CO2 29, glucose 97, BUN 7, creatinine 0.8 and calcium 8.7.  All other laboratory studies were within normal limits. DD:  05/07/00 TD:  05/07/00 Job: [id number]

## 2010-10-12 NOTE — Op Note (Signed)
[place]. [facility]  Patient:    [name], [name]                      MRN: [id number] Proc. Date: 04/21/00 Adm. 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  POSTOPERATIVE DIAGNOSIS:  OPERATION PERFORMED:  SURGEON:  Georgena Spurling, M.D.  ASSISTANT:  Arnoldo Morale, P.A.  ANESTHESIA:  General endotracheal.  INDICATIONS FOR PROCEDURE:  The patient is one year status post total knee replacement and manipulation and arthroscopy and still with significant flexion contracture.  There is x-ray evidence of posterior osteophytes which are causing mechanical block and after informed consent was obtained, she was taken to the operating room.  DESCRIPTION OF PROCEDURE:  The patient was laid supine.  The right lower extremity was prepped and draped in the usual sterile fashion.  A #10 blade was used to make the standard midline incision over the old incision.  Sharp dissection was continued down to identify the quadriceps tendon, patellar retinaculum and patellar tendon.  The same medial parapatellar arthrotomy was used.  A complete synovectomy and a patellofemoral pouch was performed.  A lateral release was performed as well as there was significant scar tissue in this area.  We also removed a significant amount of synovium from around the patellar button and behind the patellar tendon.  We then brought the knee into flexion, placed a Hohman laterally, a Z-retractor medially.  We did develop a subperiosteal sleeve on the deep MCL off the proximal tibia.  I then removed the locking pin and the polyethylene component.  We then used a series of osterotomes, pituitary rongeurs and curets to remove large osteophytes from both the medial and lateral femoral condyles.  I removed soft tissue from the notch but salvaged the PCL,  which I felt was tight in extremes of flexion and extension.  I did not attempt to do anything further to the PCL.  I then trialed with various polyethylenes and felt that the 12 mm offered the best balance in flexion and extension. This was the same size that was removed.  We then irrigated with the pulse lavage system copiously.  We then trialed once again.  We did obtain full extension with no hyperextension.  We obtained approximately 105 to 110 degrees of flexion before the PCL was tight and did not allow any further flexion.  This was done with the patella reduced and clamped.  I then attempted to strip a little more capsule off the posterior aspect of the femur except for what I could not reach due to the presence of the PCL.  This may have given another 2 to 5 degrees of flexion.  We then placed the real polyethylene, a new locking pin, tested all fexion-extension again and got 0 to 110 degrees. We then closed the arthrotomy with interrupted #1 Vicryl sutures.  The deep soft tissue with interrupted 0 Vicryls, then a running subcuticular and skin staples which were done in 110 degrees of flexion.  We did leave a medium Hemovac deep to the arthrotomy.  We dressed with Xeroform, 4 x 4s, ABDs, one layer of Webril and thigh high TED hose.  TOURNIQUET TIME:  45 minutes.  COMPLICATIONS:  None.  DRAINS:  One Hemovac.  ESTIMATED BLOOD LOSS:  Approximately 750 cc. DD:  04/21/00 TD:  04/21/00 Job: [id number]

## 2010-10-12 NOTE — Discharge Summary (Signed)
NAMESHAYLINN, HLADIK               ACCOUNT NO.:  [id number]   MEDICAL RECORD NO.:  [id number]          PATIENT TYPE:  INP   LOCATION:  1414                         FACILITY[facility]  [facility]   PHYSICIAN:  Martina Sinner, MD DATE OF BIRTH:  1941-04-24   DATE OF ADMISSION:  07/15/2006  DATE OF DISCHARGE:  07/24/2006                               DISCHARGE SUMMARY   ADMISSION DIAGNOSES:  Rectocele.   DISCHARGE DIAGNOSES:  1. Rectocele.  2. Rectal leak.   PROCEDURES DONE DURING THIS HOSPITAL STAY:  1. Rectocele repair with vault suspension using dermal graft done on      July 15, 2006.  2. Exploratory laparotomy with extensive lysis of adhesions and      sigmoid colostomy with Hartman's pouch done on July 18, 2006.   COMPLICATIONS DURING THIS HOSPITAL STAY:  Rectal leak requiring  diverting colostomy.   HISTORY OF PRESENT ILLNESS AND HOSPITAL COURSE:  This is a 70 year old  white female who presented to Dr. Mina Marble office with a posterior  vaginal bulge and a rectocele.  After extensive counseling regarding her  therapeutic options such as expected management versus surgical repair,  the patient elected to undergo surgical repair after discussing the  risks and benefits.  She successfully underwent the procedure on  July 15, 2006.  Postoperatively the patient started having fevers  that did not resolve by 36 hours which prompted fever work-up.  A chest  x-ray revealed air under the diaphragm and the patient started having  abdominal pain.  This prompted surgery consult and a CT of the abdomen  and pelvis with rectal and IV contrast.  This demonstrated rectal leak.  The patient was taken on an emergency basis to the operating room where  she had a diverting colostomy.  Postoperatively the patient did well.  She slowly defervesced.  Her bowel function came back with colostomy  functioning well.  She did have some erythema develop at the lower  portion of her wound that  was opened up.  There was no evidence of  dehiscence and the wound granulated nicely.  On hospital admission day  ten, the patient was deemed stable to go home.  At discharge the patient  was afebrile, vital signs were stable.  She was in no acute distress.  She was awake, alert and not oriented.  Abdomen was soft, nontender.  Colostomy was producing.  Her wound was clean and dry with a small area  of separation at the bottom of the wound that was due to removal of the  staples.  That area was granulating nicely.  Fascia was intact.  The  patient did not have any lower extremity asymmetric swelling.  The  patient was then discharged home on antibiotics for two weeks.  Those  antibiotics included Ciprofloxacin 500 mg p.o. b.i.d. and Flagyl 500 mg  p.o. t.i.d. for two weeks.  The patient was given a prescription for  Vicodin, Colace and Milk of Mag.  She was advised to resume her home  medications.  The patient  will follow-up with Dr. Corliss Skains in about one week and with  Dr. Sherron Monday  in two weeks.  Should the patient have any fever, chills, increasing  abdominal pain, wound concerns such as redness, separation or pain she  is to contact us promptly or come to the Emergency Room.     ______________________________  Cornelious Bryant, MD      Martina Sinner, MD  Electronically Signed    SK/MEDQ  D:  07/24/2006  T:  07/24/2006  Job:  [id number]

## 2010-10-12 NOTE — H&P (Signed)
NAMECAMBREA, [name]               ACCOUNT NO.:  [id number]   MEDICAL RECORD NO.:  [id number]          PATIENT TYPE:  OIB   LOCATION:  1414                         FACILITY:  [facility]   PHYSICIAN:  Martina Sinner, MD DATE OF BIRTH:  Sep 19, 1940   DATE OF ADMISSION:  07/15/2006  DATE OF DISCHARGE:                              HISTORY & PHYSICAL   ADMISSION DIAGNOSIS:  Rectocele and vault prolapse; postoperative fever.   SURGERY:  Vaginal vault prolapse with rectocele repair February 19.   [name] had vaginal vault suspension and rectocele repair yesterday.  Her surgery went very well.  On the first morning following her surgery,  she had a right lower quadrant discomfort.  She had no CVA tenderness  and her abdomen was benign. Her creatinine was not elevated and her  white blood count was normal.  I wanted to observe her until noon hour  to see if her pain settled down.  Her vaginal pack had been removed and  her Foley catheter was removed.   This morning she has been having trouble emptying her bladder.  She  voids approximately 75 mL and her residuals can be as high as 175 mL.  She spiked a temperature to 101.   She did not look toxic.  She had no CVA tenderness. Her right lower  quadrant pain is gone. She did not have abdominal tenderness.  She now  has some suprapubic discomfort and she feels like she needs to urinate.   I thought it was best to observe her and I will keep her on oral  ciprofloxacin.  Her IV went interstitial.  I will keep an eye on her CBC  and creatinine tomorrow morning.   PAST MEDICAL HISTORY:  No previous GU surgery.   SOCIAL HISTORY:  [place] within the region.   FAMILY HISTORY:  No kidney disease.   MEDICATIONS:  Her medications are listed including hydrochlorothiazide,  Xanax, estrogen and other medications listed.   ALLERGIES:  PENICILLIN.   REVIEW OF SYSTEMS:  The rest of her review of systems negative.   PHYSICAL EXAM:  GENERAL  APPEARANCE:  Nontoxic.  CARDIOVASCULAR:  Skin warm to touch.  RESPIRATORY: Chest is completely clear though she had a few rhonchi in  the left upper chest.  ABDOMEN:  Soft and benign.  MUSCULOSKELETAL:  Normal mobility.   [name] was admitted for observation.  I will keep her on oral  ciprofloxacin.  I am going to reinsert her Foley cath.           ______________________________  Martina Sinner, MD  Electronically Signed     SAM/MEDQ  D:  07/16/2006  T:  07/16/2006  Job:  [id number]

## 2010-10-12 NOTE — Op Note (Signed)
[name], [name]                         ACCOUNT NO.:  [id number]   MEDICAL RECORD NO.:  [id number]                   PATIENT TYPE:  AMB   LOCATION:  ENDO                                 FACILITY:  [facility]   PHYSICIAN:  Danise Edge, M.D.                DATE OF BIRTH:  August 18, 1940   DATE OF PROCEDURE:  07/27/2003  DATE OF DISCHARGE:                                 OPERATIVE REPORT   PROCEDURE:  Screening colonoscopy.   PROCEDURE INDICATION:  [name] is a 70 year old female born 12-11-1940.  [name] is scheduled to undergo her first screening colonoscopy  with polypectomy to prevent colon cancer.   ENDOSCOPIST:  Danise Edge, M.D.   PREMEDICATION:  Versed 10 mg, Demerol 75 mg.   DESCRIPTION OF PROCEDURE:  After obtaining informed consent, [name] was  placed in the left lateral decubitus position.  I administered intravenous  Demerol and intravenous Versed to achieve conscious sedation for the  procedure.  The patient's blood pressure, oxygen saturation, and cardiac  rhythm were monitored throughout the procedure and documented in the medical  record.   Anal inspection was normal.  Digital rectal exam was normal.  The Olympus  adjustable pediatric colonoscope was introduced into the rectum and advanced  the cecum.  Colonic preparation for the exam today was excellent.   Rectum normal.   Sigmoid colon and descending colon normal.   Splenic flexure normal.   Transverse colon normal.   hepatic flexure normal.   Ascending colon normal.   Cecum and ileocecal valve normal.   ASSESSMENT:  Normal screening proctocolonoscopy to the cecum.  No endoscopic  evidence for the presence of colorectal neoplasia.                                               Danise Edge, M.D.    MJ/MEDQ  D:  07/27/2003  T:  07/27/2003  Job:  [id number]   cc:   Georgann Housekeeper, M.D.  [address]  [place]  [place] [address]  Fax: [phone]

## 2010-10-12 NOTE — Op Note (Signed)
[name], [name]               ACCOUNT NO.:  [id number]   MEDICAL RECORD NO.:  [id number]          PATIENT TYPE:  AMB   LOCATION:  DAY                          FACILITY:  [facility]   PHYSICIAN:  Martina Sinner, MD DATE OF BIRTH:  03-Aug-1940   DATE OF PROCEDURE:  07/15/2006  DATE OF DISCHARGE:                               OPERATIVE REPORT   PREOPERATIVE DIAGNOSES:  1. Rectocele.  2. Possible enterocele.  3. Vault prolapse.   POSTOPERATIVE DIAGNOSES:  1. Rectocele.  2. Vault prolapse.   SURGERY:  1. Vault prolapse repair plus rectocele repair utilizing dermal graft.  2. Cystoscopy.   SURGEON:  Martina Sinner, MD   INDICATION:  [name] has a posterior defect and a vaginal  cuff defect.  She was symptomatic.   DESCRIPTION OF PROCEDURE:  The patient was prepped and draped in the  usual fashion.  Extra care was taken when we positioned her legs to  minimize the risk compartment syndrome, neuropathy and DVT.  She was  given preoperative antibiotics and her laboratory work was normal.   There is no question under anesthesia she appeared to have an apical or  high bulging posterior defect in keeping with a shiny enterocele.  It  turned out that this represented a rectocele with a lot of thinning of  the rectovaginal fascia.   Initially, I placed two 3-0 Vicryl sutures at the dimples at the apex of  the vagina.  She had a moderately shortened anterior vaginal wall.  It  was well supported.   I initially between 2 Allises removed a small triangle of perineal skin  and then I made a long midline incision and sharply dissected the  vaginal mucosa posteriorly off the rectocele and posterior defect.  The  tissues were quite thin and sharp dissection was utilized.  I was very  happy that I dissected sharply beyond the marked dimples at the apex.  I  also mobilized nicely laterally.  I broke through sharply the pararectal  gutters bilaterally and could palpate the  ischial spines.  Hemostasis  was excellent.   I sharply tried to dissect out an enterocele, but it was obvious after  doing some dissection and doing multiple digital rectal examinations  that she did not have an enterocele, but just diffuse thinning of the  rectovaginal mucosa with a large defect.   I trimmed about 1 cm of thin rectovaginal fascia and reapproximated a  site defect which extended from her left to right side in a transverse  direction.  I then did a double-layer closure of this and this repair  did reduce the size of the bulging by approximately 75%.  I did not feel  that I could do a midline imbrication without distorting the bowel  anatomy and there was little to no fascia to reapproximate.   A 10 x 6-cm dermal graft was cut in the shape of a trapezoid.  Using  Capio device, 0 Vicryl was placed on the ischial spines bilaterally.  I  then sewed the wide part of the graft back to the ischial  spine, leaving  graft cephalad.  I then sutured the distal part of the graft to the  rectovaginal mucosa, approximately 1-cm cephalad to the introitus.  The  graft laid in beautifully and reduced not only the apex, but also the  posterior defect.  A midline distal 3-0 Vicryl was used.  Beyond the  dimples, we placed two 3-0 Vicryl sutures and brought them through the  graft cephalad.  This reapproximated graft at the apex of the vagina.   At the beginning of the case, I scoped the patient to make certain that  I had dissected sharply what I thought initially was an enterocele from  the bladder and I had.  It turned out once again to the rectocele with  very thin rectovaginal fascia.   Repeat rectal examinations did not demonstrate any distortion of the  rectum or suture in the rectum.  Hemostasis was excellent.  Total blood  loss was less than 150 mL.  Foley catheter was draining well at the end  of the case.   I trimmed 1 cm on the left side and 0.5 cm of the right side of  the  posterior vaginal mucosa and closed with running 2-0 Vicryl suture on a  CT-1 needle.  A 0 Vicryl on a CT-1 was used to close the perineal body.  I then closed the perineal skin with the 2-0 Vicryl passed in  subcuticular fashion.   A vaginal pack was inserted with Estrace cream.  Leg position was good  at the end of the case.   She had excellent vaginal length at the end of the case.  She had  reduction of the posterior vaginal wall.  Hopefully, this surgery will  reach her treatment goal.           ______________________________  Martina Sinner, MD  Electronically Signed     SAM/MEDQ  D:  07/15/2006  T:  07/16/2006  Job:  [id number]

## 2010-12-10 ENCOUNTER — Other Ambulatory Visit (HOSPITAL_COMMUNITY): Payer: Self-pay | Admitting: Orthopedic Surgery

## 2010-12-10 DIAGNOSIS — M25561 Pain in right knee: Secondary | ICD-10-CM

## 2010-12-10 DIAGNOSIS — Z96659 Presence of unspecified artificial knee joint: Secondary | ICD-10-CM

## 2010-12-10 DIAGNOSIS — T84038A Mechanical loosening of other internal prosthetic joint, initial encounter: Secondary | ICD-10-CM

## 2010-12-17 ENCOUNTER — Encounter (HOSPITAL_COMMUNITY)
Admission: RE | Admit: 2010-12-17 | Discharge: 2010-12-17 | Disposition: A | Discharge status: Home / Self Care | Payer: Medicare Other | Source: Ambulatory Visit | Attending: Orthopedic Surgery | Admitting: Orthopedic Surgery

## 2010-12-17 DIAGNOSIS — M25561 Pain in right knee: Secondary | ICD-10-CM

## 2010-12-17 DIAGNOSIS — T84038A Mechanical loosening of other internal prosthetic joint, initial encounter: Secondary | ICD-10-CM

## 2010-12-17 DIAGNOSIS — M25569 Pain in unspecified knee: Secondary | ICD-10-CM | POA: Insufficient documentation

## 2010-12-17 DIAGNOSIS — Z96659 Presence of unspecified artificial knee joint: Secondary | ICD-10-CM

## 2010-12-17 MED ORDER — TECHNETIUM TC 99M MEDRONATE IV KIT
23.9000 | PACK | Freq: Once | INTRAVENOUS | Status: AC | PRN
  Administered 2010-12-17: 23.9 via INTRAVENOUS

## 2011-01-04 ENCOUNTER — Encounter (HOSPITAL_COMMUNITY)
Admission: RE | Admit: 2011-01-04 | Discharge: 2011-01-04 | Disposition: A | Discharge status: Home / Self Care | Payer: Medicare Other | Source: Ambulatory Visit | Attending: Orthopedic Surgery | Admitting: Orthopedic Surgery

## 2011-01-04 ENCOUNTER — Other Ambulatory Visit (HOSPITAL_COMMUNITY): Payer: Self-pay | Admitting: Orthopedic Surgery

## 2011-01-04 ENCOUNTER — Ambulatory Visit (HOSPITAL_COMMUNITY)
Admission: RE | Admit: 2011-01-04 | Discharge: 2011-01-04 | Disposition: A | Discharge status: Home / Self Care | Payer: Medicare Other | Source: Ambulatory Visit | Attending: Orthopedic Surgery | Admitting: Orthopedic Surgery

## 2011-01-04 DIAGNOSIS — M1711 Unilateral primary osteoarthritis, right knee: Secondary | ICD-10-CM

## 2011-01-04 DIAGNOSIS — Z01812 Encounter for preprocedural laboratory examination: Secondary | ICD-10-CM | POA: Insufficient documentation

## 2011-01-04 DIAGNOSIS — J8410 Pulmonary fibrosis, unspecified: Secondary | ICD-10-CM | POA: Insufficient documentation

## 2011-01-04 DIAGNOSIS — Z01818 Encounter for other preprocedural examination: Secondary | ICD-10-CM | POA: Insufficient documentation

## 2011-01-04 LAB — URINE MICROSCOPIC-ADD ON

## 2011-01-04 LAB — SURGICAL PCR SCREEN
MRSA, PCR: NEGATIVE
Staphylococcus aureus: NEGATIVE

## 2011-01-04 LAB — CBC
HCT: 43.2 % (ref 36.0–46.0)
Hemoglobin: 14.5 g/dL (ref 12.0–15.0)
MCH: 30.3 pg (ref 26.0–34.0)
MCHC: 33.6 g/dL (ref 30.0–36.0)
MCV: 90.2 fL (ref 78.0–100.0)
Platelets: 240 10*3/uL (ref 150–400)
RBC: 4.79 MIL/uL (ref 3.87–5.11)
RDW: 14 % (ref 11.5–15.5)
WBC: 6.1 10*3/uL (ref 4.0–10.5)

## 2011-01-04 LAB — URINALYSIS, ROUTINE W REFLEX MICROSCOPIC
Bilirubin Urine: NEGATIVE
Glucose, UA: NEGATIVE mg/dL
Hgb urine dipstick: NEGATIVE
Ketones, ur: NEGATIVE mg/dL
Nitrite: NEGATIVE
Protein, ur: NEGATIVE mg/dL
Specific Gravity, Urine: 1.009 (ref 1.005–1.030)
Urobilinogen, UA: 0.2 mg/dL (ref 0.0–1.0)
pH: 7.5 (ref 5.0–8.0)

## 2011-01-04 LAB — DIFFERENTIAL
Basophils Absolute: 0.1 10*3/uL (ref 0.0–0.1)
Basophils Relative: 1 % (ref 0–1)
Eosinophils Absolute: 0.3 10*3/uL (ref 0.0–0.7)
Eosinophils Relative: 4 % (ref 0–5)
Lymphocytes Relative: 50 % — ABNORMAL HIGH (ref 12–46)
Lymphs Abs: 3.1 10*3/uL (ref 0.7–4.0)
Monocytes Absolute: 0.7 10*3/uL (ref 0.1–1.0)
Monocytes Relative: 11 % (ref 3–12)
Neutro Abs: 2.1 10*3/uL (ref 1.7–7.7)
Neutrophils Relative %: 34 % — ABNORMAL LOW (ref 43–77)

## 2011-01-04 LAB — COMPREHENSIVE METABOLIC PANEL
ALT: 27 U/L (ref 0–35)
AST: 35 U/L (ref 0–37)
Albumin: 3.9 g/dL (ref 3.5–5.2)
Alkaline Phosphatase: 85 U/L (ref 39–117)
BUN: 8 mg/dL (ref 6–23)
CO2: 30 mEq/L (ref 19–32)
Calcium: 9.9 mg/dL (ref 8.4–10.5)
Chloride: 101 mEq/L (ref 96–112)
Creatinine, Ser: 1.06 mg/dL (ref 0.50–1.10)
GFR calc Af Amer: >60 mL/min (ref >60)
GFR calc non Af Amer: 51 mL/min — ABNORMAL LOW (ref >60)
Glucose, Bld: 81 mg/dL (ref 70–99)
Potassium: 4.3 mEq/L (ref 3.5–5.1)
Sodium: 140 mEq/L (ref 135–145)
Total Bilirubin: 0.3 mg/dL (ref 0.3–1.2)
Total Protein: 8.1 g/dL (ref 6.0–8.3)

## 2011-01-04 LAB — PROTIME-INR
INR: 0.95 (ref 0.00–1.49)
Prothrombin Time: 12.9 seconds (ref 11.6–15.2)

## 2011-01-04 LAB — APTT: aPTT: 34 seconds (ref 24–37)

## 2011-01-05 LAB — URINE CULTURE
Colony Count: NO GROWTH
Culture  Setup Time: 201208101259
Culture: NO GROWTH

## 2011-01-14 ENCOUNTER — Inpatient Hospital Stay (HOSPITAL_COMMUNITY)
Admission: RE | Admit: 2011-01-14 | Discharge: 2011-01-18 | DRG: 467 | Disposition: A | Discharge status: Home Health | Payer: Medicare Other | Source: Ambulatory Visit | Attending: Orthopedic Surgery | Admitting: Orthopedic Surgery

## 2011-01-14 DIAGNOSIS — K219 Gastro-esophageal reflux disease without esophagitis: Secondary | ICD-10-CM | POA: Diagnosis present

## 2011-01-14 DIAGNOSIS — E876 Hypokalemia: Secondary | ICD-10-CM | POA: Diagnosis not present

## 2011-01-14 DIAGNOSIS — I10 Essential (primary) hypertension: Secondary | ICD-10-CM | POA: Diagnosis present

## 2011-01-14 DIAGNOSIS — Z933 Colostomy status: Secondary | ICD-10-CM

## 2011-01-14 DIAGNOSIS — N179 Acute kidney failure, unspecified: Secondary | ICD-10-CM | POA: Diagnosis present

## 2011-01-14 DIAGNOSIS — Z96659 Presence of unspecified artificial knee joint: Secondary | ICD-10-CM

## 2011-01-14 DIAGNOSIS — F17200 Nicotine dependence, unspecified, uncomplicated: Secondary | ICD-10-CM | POA: Diagnosis present

## 2011-01-14 DIAGNOSIS — J4489 Other specified chronic obstructive pulmonary disease: Secondary | ICD-10-CM | POA: Diagnosis present

## 2011-01-14 DIAGNOSIS — T84099A Other mechanical complication of unspecified internal joint prosthesis, initial encounter: Principal | ICD-10-CM | POA: Diagnosis present

## 2011-01-14 DIAGNOSIS — J449 Chronic obstructive pulmonary disease, unspecified: Secondary | ICD-10-CM | POA: Diagnosis present

## 2011-01-14 DIAGNOSIS — D62 Acute posthemorrhagic anemia: Secondary | ICD-10-CM | POA: Diagnosis not present

## 2011-01-14 DIAGNOSIS — Y831 Surgical operation with implant of artificial internal device as the cause of abnormal reaction of the patient, or of later complication, without mention of misadventure at the time of the procedure: Secondary | ICD-10-CM | POA: Diagnosis present

## 2011-01-14 DIAGNOSIS — F319 Bipolar disorder, unspecified: Secondary | ICD-10-CM | POA: Diagnosis present

## 2011-01-15 LAB — CBC
HCT: 35.6 % — ABNORMAL LOW (ref 36.0–46.0)
Hemoglobin: 11.3 g/dL — ABNORMAL LOW (ref 12.0–15.0)
MCH: 29 pg (ref 26.0–34.0)
MCHC: 31.7 g/dL (ref 30.0–36.0)
MCV: 91.5 fL (ref 78.0–100.0)
Platelets: 167 10*3/uL (ref 150–400)
RBC: 3.89 MIL/uL (ref 3.87–5.11)
RDW: 14.3 % (ref 11.5–15.5)
WBC: 8.2 10*3/uL (ref 4.0–10.5)

## 2011-01-15 LAB — BASIC METABOLIC PANEL
BUN: 14 mg/dL (ref 6–23)
BUN: 21 mg/dL (ref 6–23)
CO2: 25 mEq/L (ref 19–32)
CO2: 25 mEq/L (ref 19–32)
Calcium: 8.6 mg/dL (ref 8.4–10.5)
Calcium: 8.9 mg/dL (ref 8.4–10.5)
Chloride: 105 mEq/L (ref 96–112)
Chloride: 104 mEq/L (ref 96–112)
Creatinine, Ser: 2.32 mg/dL — ABNORMAL HIGH (ref 0.50–1.10)
Creatinine, Ser: 4.05 mg/dL — ABNORMAL HIGH (ref 0.50–1.10)
GFR calc Af Amer: 25 mL/min — ABNORMAL LOW (ref >60)
GFR calc Af Amer: 13 mL/min — ABNORMAL LOW (ref >60)
GFR calc non Af Amer: 21 mL/min — ABNORMAL LOW (ref >60)
GFR calc non Af Amer: 11 mL/min — ABNORMAL LOW (ref >60)
Glucose, Bld: 98 mg/dL (ref 70–99)
Glucose, Bld: 98 mg/dL (ref 70–99)
Potassium: 3.8 mEq/L (ref 3.5–5.1)
Potassium: 3.9 mEq/L (ref 3.5–5.1)
Sodium: 140 mEq/L (ref 135–145)
Sodium: 136 mEq/L (ref 135–145)

## 2011-01-16 ENCOUNTER — Other Ambulatory Visit: Payer: Self-pay | Admitting: Orthopedic Surgery

## 2011-01-16 LAB — CBC
HCT: 31.5 % — ABNORMAL LOW (ref 36.0–46.0)
HCT: 31 % — ABNORMAL LOW (ref 36.0–46.0)
HCT: 32.5 % — ABNORMAL LOW (ref 36.0–46.0)
HCT: 32.1 % — ABNORMAL LOW (ref 36.0–46.0)
Hemoglobin: 10 g/dL — ABNORMAL LOW (ref 12.0–15.0)
Hemoglobin: 10.1 g/dL — ABNORMAL LOW (ref 12.0–15.0)
Hemoglobin: 10.5 g/dL — ABNORMAL LOW (ref 12.0–15.0)
Hemoglobin: 10.5 g/dL — ABNORMAL LOW (ref 12.0–15.0)
MCH: 29.2 pg (ref 26.0–34.0)
MCH: 29.7 pg (ref 26.0–34.0)
MCH: 29.2 pg (ref 26.0–34.0)
MCH: 29.2 pg (ref 26.0–34.0)
MCHC: 31.7 g/dL (ref 30.0–36.0)
MCHC: 32.6 g/dL (ref 30.0–36.0)
MCHC: 32.3 g/dL (ref 30.0–36.0)
MCHC: 32.7 g/dL (ref 30.0–36.0)
MCV: 91.8 fL (ref 78.0–100.0)
MCV: 91.2 fL (ref 78.0–100.0)
MCV: 90.5 fL (ref 78.0–100.0)
MCV: 89.4 fL (ref 78.0–100.0)
Platelets: 147 10*3/uL — ABNORMAL LOW (ref 150–400)
Platelets: 149 10*3/uL — ABNORMAL LOW (ref 150–400)
Platelets: 151 10*3/uL (ref 150–400)
Platelets: 154 10*3/uL (ref 150–400)
RBC: 3.43 MIL/uL — ABNORMAL LOW (ref 3.87–5.11)
RBC: 3.4 MIL/uL — ABNORMAL LOW (ref 3.87–5.11)
RBC: 3.59 MIL/uL — ABNORMAL LOW (ref 3.87–5.11)
RBC: 3.59 MIL/uL — ABNORMAL LOW (ref 3.87–5.11)
RDW: 14.4 % (ref 11.5–15.5)
RDW: 14.3 % (ref 11.5–15.5)
RDW: 14 % (ref 11.5–15.5)
RDW: 14 % (ref 11.5–15.5)
WBC: 8.5 10*3/uL (ref 4.0–10.5)
WBC: 9.1 10*3/uL (ref 4.0–10.5)
WBC: 8.8 10*3/uL (ref 4.0–10.5)
WBC: 9.4 10*3/uL (ref 4.0–10.5)

## 2011-01-16 LAB — URINALYSIS, ROUTINE W REFLEX MICROSCOPIC
Glucose, UA: NEGATIVE mg/dL
Ketones, ur: NEGATIVE mg/dL
Nitrite: NEGATIVE
Protein, ur: 30 mg/dL — AB
Specific Gravity, Urine: 1.015 (ref 1.005–1.030)
Urobilinogen, UA: 1 mg/dL (ref 0.0–1.0)
pH: 5 (ref 5.0–8.0)

## 2011-01-16 LAB — URINE MICROSCOPIC-ADD ON

## 2011-01-16 LAB — DIFFERENTIAL
Basophils Absolute: 0 10*3/uL (ref 0.0–0.1)
Basophils Absolute: 0 10*3/uL (ref 0.0–0.1)
Basophils Absolute: 0 10*3/uL (ref 0.0–0.1)
Basophils Absolute: 0 10*3/uL (ref 0.0–0.1)
Basophils Relative: 0 % (ref 0–1)
Basophils Relative: 0 % (ref 0–1)
Basophils Relative: 0 % (ref 0–1)
Basophils Relative: 0 % (ref 0–1)
Eosinophils Absolute: 0.2 10*3/uL (ref 0.0–0.7)
Eosinophils Absolute: 0.2 10*3/uL (ref 0.0–0.7)
Eosinophils Absolute: 0.1 10*3/uL (ref 0.0–0.7)
Eosinophils Absolute: 0.1 10*3/uL (ref 0.0–0.7)
Eosinophils Relative: 3 % (ref 0–5)
Eosinophils Relative: 3 % (ref 0–5)
Eosinophils Relative: 2 % (ref 0–5)
Eosinophils Relative: 1 % (ref 0–5)
Lymphocytes Relative: 14 % (ref 12–46)
Lymphocytes Relative: 13 % (ref 12–46)
Lymphocytes Relative: 11 % — ABNORMAL LOW (ref 12–46)
Lymphocytes Relative: 12 % (ref 12–46)
Lymphs Abs: 1.2 10*3/uL (ref 0.7–4.0)
Lymphs Abs: 1.2 10*3/uL (ref 0.7–4.0)
Lymphs Abs: 1 10*3/uL (ref 0.7–4.0)
Lymphs Abs: 1.2 10*3/uL (ref 0.7–4.0)
Monocytes Absolute: 1.5 10*3/uL — ABNORMAL HIGH (ref 0.1–1.0)
Monocytes Absolute: 1.3 10*3/uL — ABNORMAL HIGH (ref 0.1–1.0)
Monocytes Absolute: 0.9 10*3/uL (ref 0.1–1.0)
Monocytes Absolute: 1.2 10*3/uL — ABNORMAL HIGH (ref 0.1–1.0)
Monocytes Relative: 17 % — ABNORMAL HIGH (ref 3–12)
Monocytes Relative: 14 % — ABNORMAL HIGH (ref 3–12)
Monocytes Relative: 11 % (ref 3–12)
Monocytes Relative: 13 % — ABNORMAL HIGH (ref 3–12)
Neutro Abs: 5.6 10*3/uL (ref 1.7–7.7)
Neutro Abs: 6.4 10*3/uL (ref 1.7–7.7)
Neutro Abs: 6.8 10*3/uL (ref 1.7–7.7)
Neutro Abs: 6.9 10*3/uL (ref 1.7–7.7)
Neutrophils Relative %: 65 % (ref 43–77)
Neutrophils Relative %: 70 % (ref 43–77)
Neutrophils Relative %: 77 % (ref 43–77)
Neutrophils Relative %: 74 % (ref 43–77)

## 2011-01-16 LAB — BASIC METABOLIC PANEL
BUN: 24 mg/dL — ABNORMAL HIGH (ref 6–23)
BUN: 24 mg/dL — ABNORMAL HIGH (ref 6–23)
BUN: 25 mg/dL — ABNORMAL HIGH (ref 6–23)
BUN: 27 mg/dL — ABNORMAL HIGH (ref 6–23)
CO2: 22 mEq/L (ref 19–32)
CO2: 23 mEq/L (ref 19–32)
CO2: 23 mEq/L (ref 19–32)
CO2: 22 mEq/L (ref 19–32)
Calcium: 8.3 mg/dL — ABNORMAL LOW (ref 8.4–10.5)
Calcium: 8.6 mg/dL (ref 8.4–10.5)
Calcium: 8.8 mg/dL (ref 8.4–10.5)
Calcium: 9.1 mg/dL (ref 8.4–10.5)
Chloride: 106 mEq/L (ref 96–112)
Chloride: 106 mEq/L (ref 96–112)
Chloride: 108 mEq/L (ref 96–112)
Chloride: 110 mEq/L (ref 96–112)
Creatinine, Ser: 4.25 mg/dL — ABNORMAL HIGH (ref 0.50–1.10)
Creatinine, Ser: 4.31 mg/dL — ABNORMAL HIGH (ref 0.50–1.10)
Creatinine, Ser: 4.83 mg/dL — ABNORMAL HIGH (ref 0.50–1.10)
Creatinine, Ser: 4.57 mg/dL — ABNORMAL HIGH (ref 0.50–1.10)
GFR calc Af Amer: 13 mL/min — ABNORMAL LOW (ref >60)
GFR calc Af Amer: 12 mL/min — ABNORMAL LOW (ref >60)
GFR calc Af Amer: 11 mL/min — ABNORMAL LOW (ref >60)
GFR calc Af Amer: 12 mL/min — ABNORMAL LOW (ref >60)
GFR calc non Af Amer: 10 mL/min — ABNORMAL LOW (ref >60)
GFR calc non Af Amer: 10 mL/min — ABNORMAL LOW (ref >60)
GFR calc non Af Amer: 9 mL/min — ABNORMAL LOW (ref >60)
GFR calc non Af Amer: 10 mL/min — ABNORMAL LOW (ref >60)
Glucose, Bld: 82 mg/dL (ref 70–99)
Glucose, Bld: 98 mg/dL (ref 70–99)
Glucose, Bld: 117 mg/dL — ABNORMAL HIGH (ref 70–99)
Glucose, Bld: 123 mg/dL — ABNORMAL HIGH (ref 70–99)
Potassium: 3.9 mEq/L (ref 3.5–5.1)
Potassium: 4.1 mEq/L (ref 3.5–5.1)
Potassium: 3.9 mEq/L (ref 3.5–5.1)
Potassium: 3.8 mEq/L (ref 3.5–5.1)
Sodium: 137 mEq/L (ref 135–145)
Sodium: 137 mEq/L (ref 135–145)
Sodium: 140 mEq/L (ref 135–145)
Sodium: 141 mEq/L (ref 135–145)

## 2011-01-16 LAB — CREATININE, URINE, RANDOM: Creatinine, Urine: 119.47 mg/dL

## 2011-01-16 LAB — SODIUM, URINE, RANDOM: Sodium, Ur: 22 mEq/L

## 2011-01-16 LAB — OSMOLALITY, URINE: Osmolality, Ur: 199 mOsm/kg — ABNORMAL LOW (ref 390–1090)

## 2011-01-17 LAB — CBC
HCT: 30.4 % — ABNORMAL LOW (ref 36.0–46.0)
HCT: 29.8 % — ABNORMAL LOW (ref 36.0–46.0)
HCT: 29.7 % — ABNORMAL LOW (ref 36.0–46.0)
HCT: 29.2 % — ABNORMAL LOW (ref 36.0–46.0)
Hemoglobin: 9.9 g/dL — ABNORMAL LOW (ref 12.0–15.0)
Hemoglobin: 9.9 g/dL — ABNORMAL LOW (ref 12.0–15.0)
Hemoglobin: 9.9 g/dL — ABNORMAL LOW (ref 12.0–15.0)
Hemoglobin: 9.8 g/dL — ABNORMAL LOW (ref 12.0–15.0)
MCH: 29 pg (ref 26.0–34.0)
MCH: 29.4 pg (ref 26.0–34.0)
MCH: 29.4 pg (ref 26.0–34.0)
MCH: 29.2 pg (ref 26.0–34.0)
MCHC: 32.6 g/dL (ref 30.0–36.0)
MCHC: 33.2 g/dL (ref 30.0–36.0)
MCHC: 33.3 g/dL (ref 30.0–36.0)
MCHC: 33.6 g/dL (ref 30.0–36.0)
MCV: 89.1 fL (ref 78.0–100.0)
MCV: 88.4 fL (ref 78.0–100.0)
MCV: 88.1 fL (ref 78.0–100.0)
MCV: 86.9 fL (ref 78.0–100.0)
Platelets: 158 10*3/uL (ref 150–400)
Platelets: 176 10*3/uL (ref 150–400)
Platelets: 192 10*3/uL (ref 150–400)
Platelets: 192 10*3/uL (ref 150–400)
RBC: 3.41 MIL/uL — ABNORMAL LOW (ref 3.87–5.11)
RBC: 3.37 MIL/uL — ABNORMAL LOW (ref 3.87–5.11)
RBC: 3.37 MIL/uL — ABNORMAL LOW (ref 3.87–5.11)
RBC: 3.36 MIL/uL — ABNORMAL LOW (ref 3.87–5.11)
RDW: 14.3 % (ref 11.5–15.5)
RDW: 14.2 % (ref 11.5–15.5)
RDW: 14.2 % (ref 11.5–15.5)
RDW: 14 % (ref 11.5–15.5)
WBC: 8.3 10*3/uL (ref 4.0–10.5)
WBC: 7.5 10*3/uL (ref 4.0–10.5)
WBC: 7.8 10*3/uL (ref 4.0–10.5)
WBC: 7.8 10*3/uL (ref 4.0–10.5)

## 2011-01-17 LAB — BASIC METABOLIC PANEL
BUN: 27 mg/dL — ABNORMAL HIGH (ref 6–23)
BUN: 29 mg/dL — ABNORMAL HIGH (ref 6–23)
BUN: 29 mg/dL — ABNORMAL HIGH (ref 6–23)
BUN: 28 mg/dL — ABNORMAL HIGH (ref 6–23)
CO2: 22 mEq/L (ref 19–32)
CO2: 21 mEq/L (ref 19–32)
CO2: 23 mEq/L (ref 19–32)
CO2: 22 mEq/L (ref 19–32)
Calcium: 8.9 mg/dL (ref 8.4–10.5)
Calcium: 9 mg/dL (ref 8.4–10.5)
Calcium: 9.3 mg/dL (ref 8.4–10.5)
Calcium: 9.3 mg/dL (ref 8.4–10.5)
Chloride: 111 mEq/L (ref 96–112)
Chloride: 109 mEq/L (ref 96–112)
Chloride: 107 mEq/L (ref 96–112)
Chloride: 107 mEq/L (ref 96–112)
Creatinine, Ser: 4.63 mg/dL — ABNORMAL HIGH (ref 0.50–1.10)
Creatinine, Ser: 4.45 mg/dL — ABNORMAL HIGH (ref 0.50–1.10)
Creatinine, Ser: 4.42 mg/dL — ABNORMAL HIGH (ref 0.50–1.10)
Creatinine, Ser: 4.24 mg/dL — ABNORMAL HIGH (ref 0.50–1.10)
GFR calc Af Amer: 11 mL/min — ABNORMAL LOW (ref >60)
GFR calc Af Amer: 12 mL/min — ABNORMAL LOW (ref >60)
GFR calc Af Amer: 12 mL/min — ABNORMAL LOW (ref >60)
GFR calc Af Amer: 13 mL/min — ABNORMAL LOW (ref >60)
GFR calc non Af Amer: 9 mL/min — ABNORMAL LOW (ref >60)
GFR calc non Af Amer: 10 mL/min — ABNORMAL LOW (ref >60)
GFR calc non Af Amer: 10 mL/min — ABNORMAL LOW (ref >60)
GFR calc non Af Amer: 10 mL/min — ABNORMAL LOW (ref >60)
Glucose, Bld: 93 mg/dL (ref 70–99)
Glucose, Bld: 111 mg/dL — ABNORMAL HIGH (ref 70–99)
Glucose, Bld: 92 mg/dL (ref 70–99)
Glucose, Bld: 99 mg/dL (ref 70–99)
Potassium: 3.8 mEq/L (ref 3.5–5.1)
Potassium: 3.5 mEq/L (ref 3.5–5.1)
Potassium: 3.9 mEq/L (ref 3.5–5.1)
Potassium: 3.5 mEq/L (ref 3.5–5.1)
Sodium: 141 mEq/L (ref 135–145)
Sodium: 139 mEq/L (ref 135–145)
Sodium: 138 mEq/L (ref 135–145)
Sodium: 137 mEq/L (ref 135–145)

## 2011-01-17 LAB — DIFFERENTIAL
Basophils Absolute: 0 10*3/uL (ref 0.0–0.1)
Basophils Absolute: 0 10*3/uL (ref 0.0–0.1)
Basophils Absolute: 0 10*3/uL (ref 0.0–0.1)
Basophils Absolute: 0 10*3/uL (ref 0.0–0.1)
Basophils Relative: 1 % (ref 0–1)
Basophils Relative: 0 % (ref 0–1)
Basophils Relative: 0 % (ref 0–1)
Basophils Relative: 0 % (ref 0–1)
Eosinophils Absolute: 0.1 10*3/uL (ref 0.0–0.7)
Eosinophils Absolute: 0.2 10*3/uL (ref 0.0–0.7)
Eosinophils Absolute: 0.2 10*3/uL (ref 0.0–0.7)
Eosinophils Absolute: 0.3 10*3/uL (ref 0.0–0.7)
Eosinophils Relative: 1 % (ref 0–5)
Eosinophils Relative: 3 % (ref 0–5)
Eosinophils Relative: 3 % (ref 0–5)
Eosinophils Relative: 3 % (ref 0–5)
Lymphocytes Relative: 17 % (ref 12–46)
Lymphocytes Relative: 13 % (ref 12–46)
Lymphocytes Relative: 15 % (ref 12–46)
Lymphocytes Relative: 18 % (ref 12–46)
Lymphs Abs: 1.4 10*3/uL (ref 0.7–4.0)
Lymphs Abs: 1 10*3/uL (ref 0.7–4.0)
Lymphs Abs: 1.2 10*3/uL (ref 0.7–4.0)
Lymphs Abs: 1.4 10*3/uL (ref 0.7–4.0)
Monocytes Absolute: 1.2 10*3/uL — ABNORMAL HIGH (ref 0.1–1.0)
Monocytes Absolute: 0.9 10*3/uL (ref 0.1–1.0)
Monocytes Absolute: 0.9 10*3/uL (ref 0.1–1.0)
Monocytes Absolute: 0.8 10*3/uL (ref 0.1–1.0)
Monocytes Relative: 14 % — ABNORMAL HIGH (ref 3–12)
Monocytes Relative: 12 % (ref 3–12)
Monocytes Relative: 12 % (ref 3–12)
Monocytes Relative: 10 % (ref 3–12)
Neutro Abs: 5.6 10*3/uL (ref 1.7–7.7)
Neutro Abs: 5.3 10*3/uL (ref 1.7–7.7)
Neutro Abs: 5.4 10*3/uL (ref 1.7–7.7)
Neutro Abs: 5.4 10*3/uL (ref 1.7–7.7)
Neutrophils Relative %: 67 % (ref 43–77)
Neutrophils Relative %: 71 % (ref 43–77)
Neutrophils Relative %: 70 % (ref 43–77)
Neutrophils Relative %: 69 % (ref 43–77)

## 2011-01-17 LAB — URINE CULTURE
Colony Count: >=100000
Culture  Setup Time: 201208220244
Special Requests: NEGATIVE

## 2011-01-18 LAB — CBC
HCT: 27.6 % — ABNORMAL LOW (ref 36.0–46.0)
Hemoglobin: 9.5 g/dL — ABNORMAL LOW (ref 12.0–15.0)
MCH: 30.2 pg (ref 26.0–34.0)
MCHC: 34.4 g/dL (ref 30.0–36.0)
MCV: 87.6 fL (ref 78.0–100.0)
Platelets: 201 10*3/uL (ref 150–400)
RBC: 3.15 MIL/uL — ABNORMAL LOW (ref 3.87–5.11)
RDW: 14.1 % (ref 11.5–15.5)
WBC: 7.3 10*3/uL (ref 4.0–10.5)

## 2011-01-18 LAB — TYPE AND SCREEN
ABO/RH(D): AB POS
Antibody Screen: NEGATIVE
Unit division: 0
Unit division: 0

## 2011-01-18 LAB — DIFFERENTIAL
Basophils Absolute: 0 10*3/uL (ref 0.0–0.1)
Basophils Relative: 0 % (ref 0–1)
Eosinophils Absolute: 0.3 10*3/uL (ref 0.0–0.7)
Eosinophils Relative: 4 % (ref 0–5)
Lymphocytes Relative: 19 % (ref 12–46)
Lymphs Abs: 1.4 10*3/uL (ref 0.7–4.0)
Monocytes Absolute: 1.1 10*3/uL — ABNORMAL HIGH (ref 0.1–1.0)
Monocytes Relative: 15 % — ABNORMAL HIGH (ref 3–12)
Neutro Abs: 4.4 10*3/uL (ref 1.7–7.7)
Neutrophils Relative %: 61 % (ref 43–77)

## 2011-01-18 LAB — BASIC METABOLIC PANEL
BUN: 28 mg/dL — ABNORMAL HIGH (ref 6–23)
CO2: 23 mEq/L (ref 19–32)
Calcium: 9.4 mg/dL (ref 8.4–10.5)
Chloride: 107 mEq/L (ref 96–112)
Creatinine, Ser: 4.25 mg/dL — ABNORMAL HIGH (ref 0.50–1.10)
GFR calc Af Amer: 13 mL/min — ABNORMAL LOW (ref >60)
GFR calc non Af Amer: 10 mL/min — ABNORMAL LOW (ref >60)
Glucose, Bld: 81 mg/dL (ref 70–99)
Potassium: 3.7 mEq/L (ref 3.5–5.1)
Sodium: 138 mEq/L (ref 135–145)

## 2011-02-11 NOTE — Op Note (Signed)
NAMEMAHOGANY, TORRANCE NO.:  [id number]  MEDICAL RECORD NO.:  [id number]  LOCATION:  5022                         FACILITY:  [facility]  PHYSICIAN:  Mila Homer. Sherlean Foot, M.D. DATE OF BIRTH:  1940-09-09  DATE OF PROCEDURE:  01/14/2011 DATE OF DISCHARGE:                              OPERATIVE REPORT   SURGEON:  Mila Homer. Sherlean Foot, MD  ASSISTANTS: 1. Altamese Cabal, PAC 2. Skip Mayer, [facility]  ANESTHESIA:  General.  PREOPERATIVE DIAGNOSIS:  Right knee failed total knee arthroplasty.  POSTOPERATIVE DIAGNOSIS:  Right knee failed total knee arthroplasty.  PROCEDURE:  Right revision total knee arthroplasty.  INDICATIONS FOR PROCEDURE:  The patient is a 70 year old, status post primary total knee replacement not done by me.  She has failed gone on to loosening and pain, informed consent was obtained.  DESCRIPTION OF PROCEDURE:  The patient was laid in supine, administration of general anesthesia, the right leg prepped and draped in usual sterile fashion.  Incisions made through the old incision after sterile prep and drape.  New blade was used a make median parapatellar arthrotomy and performed synovectomy.  I removed a great deal of scar tissue and then everted the patella.  The patella really did not have wear on it, I did not feel like it was part of the problem.  The femoral component was grossly loose.  I removed the debris in the gutters and then went to flexion.  I easily removed the femur after loosening it up with small osteotomes.  Once I curetted out all the loose debris there was a 2 x 3 cm cyst in the lateral femoral condyle.  I entered the tibia where I already removed the polyethylene and removed the tibial base plate and curetted and rongeured out all debrided, there was nonalignment of bone loss on the tibial side.  I then sequentially reamed to 16 on the femur and 13 on the tibia.  I used the cutting block to cut my box.  I sagittally sized to a D and  no trialed with a D with a 16 stem and it worked quite well.  Removed the trial component and placed a retractor and exposed the tibia.  I cut for tibia for size 4 with a 13 stem trial that, put the femur back in place and then returned my attention to balance and which I had to do some medial releasing, got up to size 20 and had good patellar tracking and elected not to replace the patella.  I then removed trial components, copiously irrigated.  I then cemented in the components with Palacos G cement.  I did bone graft the patella over the femoral condylar defect with crest cancellous bone graft proximally 10-15 mL.  Then, elected to tourniquet down when the bone this was felt was hard, left a Hemovac coming out superolaterally, deep to arthrotomy, pain catheter coming out supermedial and superficial arthrotomy.  I then used then closed the arthrotomy with figure-of-eight #1 Vicryl sutures, buried 0 Vicryl sutures with subcuticular 2-0 Vicryl sutures, and skin staples.  COMPLICATIONS:  None.  DRAINS:  One Hemovac.          ______________________________ Mila Homer. Myan Locatelli,  M.D.     SDL/MEDQ  D:  01/14/2011  T:  01/15/2011  Job:  [id number]  Electronically Signed by Georgena Spurling M.D. on 02/11/2011 11:56:46 AM

## 2011-02-26 LAB — COMPREHENSIVE METABOLIC PANEL
ALT: 27
ALT: 24
AST: 29
AST: 28
Albumin: 3.6
Albumin: 3.5
Alkaline Phosphatase: 94
Alkaline Phosphatase: 90
BUN: 13
BUN: 10
CO2: 25
CO2: 26
Calcium: 8.9
Calcium: 9
Chloride: 106
Chloride: 106
Creatinine, Ser: 1.23 — ABNORMAL HIGH
Creatinine, Ser: 1.15
GFR calc Af Amer: 53 — ABNORMAL LOW
GFR calc Af Amer: 57 — ABNORMAL LOW
GFR calc non Af Amer: 44 — ABNORMAL LOW
GFR calc non Af Amer: 47 — ABNORMAL LOW
Glucose, Bld: 81
Glucose, Bld: 106 — ABNORMAL HIGH
Potassium: 4.2
Potassium: 4
Sodium: 138
Sodium: 138
Total Bilirubin: <0.1 — ABNORMAL LOW
Total Bilirubin: 0.4
Total Protein: 6.9
Total Protein: 7.1

## 2011-02-26 LAB — CBC
HCT: 33.9 — ABNORMAL LOW
HCT: 34.1 — ABNORMAL LOW
Hemoglobin: 10.9 — ABNORMAL LOW
Hemoglobin: 11 — ABNORMAL LOW
MCHC: 32.2
MCHC: 32.4
MCV: 82.8
MCV: 82.3
Platelets: 182
Platelets: 196
RBC: 4.1
RBC: 4.14
RDW: 16.3 — ABNORMAL HIGH
RDW: 16.3 — ABNORMAL HIGH
WBC: 5.8
WBC: 4.9

## 2011-02-26 LAB — URINALYSIS, ROUTINE W REFLEX MICROSCOPIC
Glucose, UA: NEGATIVE
Hgb urine dipstick: NEGATIVE
Ketones, ur: NEGATIVE
Nitrite: NEGATIVE
Protein, ur: NEGATIVE
Specific Gravity, Urine: 1.028
Urobilinogen, UA: 1
pH: 5.5

## 2011-02-26 LAB — DIFFERENTIAL
Basophils Absolute: 0.1
Basophils Absolute: 0.1
Basophils Relative: 1
Basophils Relative: 1
Eosinophils Absolute: 0.4
Eosinophils Absolute: 0.3
Eosinophils Relative: 7 — ABNORMAL HIGH
Eosinophils Relative: 6 — ABNORMAL HIGH
Lymphocytes Relative: 44
Lymphocytes Relative: 28
Lymphs Abs: 2.5
Lymphs Abs: 1.4
Monocytes Absolute: 0.6
Monocytes Absolute: 0.5
Monocytes Relative: 11
Monocytes Relative: 10
Neutro Abs: 2.1
Neutro Abs: 2.7
Neutrophils Relative %: 37 — ABNORMAL LOW
Neutrophils Relative %: 55

## 2011-02-26 LAB — URINE MICROSCOPIC-ADD ON

## 2011-02-26 LAB — LIPASE, BLOOD: Lipase: 21

## 2011-02-27 NOTE — Consult Note (Signed)
[name], [name] NO.:  [id number]  MEDICAL RECORD NO.:  [id number]  LOCATION:  5022                         FACILITY:  [facility]  PHYSICIAN:  Carlota Raspberry, MD         DATE OF BIRTH:  [birth year]  DATE OF CONSULTATION:  01/15/2011 DATE OF DISCHARGE:                                CONSULTATION   PRIMARY CARE PHYSICIAN:  Georgann Housekeeper, MD  UROLOGIST:  Martina Sinner, MD  CONSULTING PHYSICIAN:  Mila Homer. Lucey, MD  REASON FOR CONSULTATION:  Acute renal failure.  CHIEF COMPLAINT:  Anuria.  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  She had this operation done on Monday and was receiving maintenance IV fluids throughout and had a Foley during the procedure as well and was making urine at around 7:00 a.m.  On Tuesday morning, she had her Foley removed and has been anuric except for about 20 mL of urine throughout the entire day, despite receiving 75 mL of normal saline throughout the day.  A Foley was replaced at 10:15 p.m. and only 10-15 mL of urine were noted to come out.  She was also bladder scanned before this and it did not show a distended bladder.  Also notably in reviewing the labs, it appears that the patient's baseline creatinine values range in the 1.1- 1.4 values with the last creatinine on January 04, 2011 being 1.06. However, at 6:00 a.m., her creatinine was noted to be 2.3 and a stat BMET done just now shows her creatinine to be in 4.05 and thus she is in for renal failure.  Review of her Ins and Outs through her hospital stay shows that she has taken in about 480 p.o. mostly on Monday and has gotten about 900 IVF over  the last 2 days.  Notably, she did get a 200 mL bolus of vancomycin as well and her outs throughout her hospital stay are 30 mL of urine.  The patient states that she normally pees at least twice a day and has no problems with this.  When asked what her neurogenic bladder history is, she has no idea what I am talking about and says that she has no problems peeing and has never had any bladder problems.  She does report that she sees a urologist for what sounds like a cystocele for which she got a "tack" but she is not very specific with this history.  She denies any history of kidney problems and has never had any acute renal failure.  She does endorse that she is extremely thirsty and has been trying to maintain p.o. intake and in fact when I gave her some water at the bedside she is drinking it greedily.  REVIEW OF SYSTEMS:  Otherwise is positive for some potential burning and dysuria last Thursday/Friday that went on for 2 days.  She states she took some kind of  over-the-counter that makes her "urine turn orange" and then her symptoms completely went away.  Review of systems otherwise is negative for fevers, chills, night sweats, rash, shortness of breath, chest pain, cough, dizziness, lightheadedness, nausea, vomiting, continued dysuria, or abdominal pain.  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 2. 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  This has been     plastied several times including this admission. 3. Hypertension. 4. GERD. 5. Chronic bronchitis for which she states she chronically produces     nocturnal sputum. 6. Depression. 7. Mild cardiomegaly noted on chest x-ray. 8. Hypokalemia. 9. Tobacco abuse. 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 11.Endometriosis.  PAST SURGICAL HISTORY:  She has had a total vaginal hysterectomy and cholecystectomy and has  had total knee replacements on the left and on the right.  She had also right third toe surgery and a nasal septal surgery.  ALLERGIES:  To PENICILLIN VK and certain types of TAPE although she tolerates paper tape fine per the E-chart.  Also to MORPHINE, AMPICILLIN, and DILAUDID.  FAMILY HISTORY:  She states both her mother and her father has had AMIs and HF.  SOCIAL HISTORY:  She lives at home with her daughter and is fairly independent with activities of daily living.  She smoked a pack per day for about 20 years but stopped for a while but then restarted about 2 weeks ago.  She does not drink any alcohol and denies any other drugs or herbal medications or over-the-counter medications.  PHYSICAL EXAMINATION:  VITAL SIGNS:  She has been afebrile through her course.  Current temperature 98.5, blood pressure 108/66, pulse 93, respirations 20, sats 94% on 2 L.  Her blood pressures through her stay have been 80-110.  Her oxygen saturation has actually been weaned down from 4 L. GENERAL:  She is a large lady sitting on hospital bed.  She looks very itchy and is scratching various parts of her body.  She is alert, oriented, and able to give a very fair history although some of her details are vague but she is in no distress and appears well. HEENT:  PERRLA.  Extraocular muscles are intact.  There is no scleral icterus.  Her mouth is without any gross lesions, however, her tongue appears extremely dry. NECK:  Supple.  There is no palpable lymphadenopathy.  Jugular pulsations were noted just above the clavicle. LUNGS:  Very diffusely wheezy with pan-expiratory wheezes in all lung fields but there are no gross rales or rhonchi.  She has fair air movement. HEART:  Regular rate and rhythm without any murmurs or gallops.  There is a very very slight systolic murmur but there are no gallops. ABDOMEN:  Soft, nontender, and nondistended a bit obese.  It is benign overall.  I was unable to  palpate a distended bladder.  However, she does have surgical scars just below her umbilicus and a lot of scar tissue underneath making it a bit difficult, but is not grossly distended. EXTREMITIES:  Warm and well-perfused.  They appear very dry.  She has very gross skin tenting of greater than 2-3 seconds on her right hand. Her right knee is wrapped in bandage and there is a bit of dry blood underneath the bandage.  Her bilateral lower extremities are in TEDs but there is no gross pitting edema. NEUROLOGIC:  She is intact with no focal  neurological deficits.  Cranial nerves II-XII are grossly intact.  She is spontaneously moving all her extremities.  She is conversant and appropriate. SKIN:  Without any gross maculopapular rashes or any other gross skin findings, although her skin turgor is poor.  LABORATORY DATA:  Most recent white blood cell count was 8.2 with no differential.  Hematocrit is at 35.6, this is below her most recent baseline in the low 40s.  Her platelet count is 167.  Her chemistry panel is from yesterday morning and most recently on January 15, 2011 at 11:00 p.m. shows a sodium of 136, potassium 3.9, chloride 104, bicarb 25, glucose 98, BUN and creatinine are 21 and 4.5.  This is rising from yesterday at 14 and 2.32, and clearly above her baseline in the 1s.  Her GFR is dropped from a baseline of greater than 60-13.  Her calcium is 8.9.  There are no other lab values.  There are no radiography to review.  IMPRESSION:  This is a 70 year old female with a history of osteoarthritis status post bilateral right knee replacements, hypertension, gastroesophageal reflux disease, chronic bronchitis, and likely some element of chronic obstructive pulmonary disease, mild cardiomegaly on chest x-ray who was admitted to the Orthopedic floor on January 14, 2011 for a right knee revision and now found to be in florid renal failure.  Internal Medicine is consulted to help assist  with management.  The etiology of her renal failure I think is most likely prerenal given her physical exam showing a very dry tongue and very obvious skin tenting.  She is also subjectively extremely thirsty.  While she has gotten about a liter through the past couple of days and is currently running maintenance at 75 mL of normal saline.  I am afraid that this has not been able to maintain her knees in a postoperative state. However, arguing against the prerenal state is the fact that her BUN to creatinine ratio is not greater than 20.  In the differential would be an acute interstitial nephritis given the fact that she has been taking Celebrex and Protonix which are common offenders for acute interstitial nephritis.  She also may have some element of drug-induced nephrotoxicity as she did gave 1 dose of vancomycin and has been also taking olmesartan through her stay in the hospital.  These have likely aggravated the situation, however, we do not have a differential on her CBC to suggest florid eosinophilia.  Acute tubular necrosis was considered, however, she does not appear to be septic or ill and has had no episodes of prolonged hypotension to corroborate an acute tubular necrosis while her systolics have been low from 80-110 through her stay and also through her operation.  She is currently running in the low 100s and mentating just fine and conversant.  Therefore, I think this is likely her baseline blood pressure.  Regarding this history of neurogenic bladder, she is unable to corroborate this history and I am not entirely sure where it came from. She does not appear to be a diabetic and has had no spinal cord injuries or anything else to suggest that she actually has a neurogenic bladder and reports that she pees completely fine on her own at home. Therefore, I suspect this may just be charted more regardless she is not distended and a bladder scan was completely negative  in any ways.  RECOMMENDATIONS:  Therefore, my recommendations at present are to: 1. Bolus at 1 L of normal saline now, then continue to  give her 125     mL/hour for a second liter and then stop pending a followup     creatinine.  Please keep in mind she has mild cardiomegaly on chest     x-ray and should she develop any evidence of desaturations to     please get a portable chest x-ray and call the MD and hold her IV     fluids. 2. Get a 4:00 a.m. BMET and a CBC with a differential to evaluate the     trend of her creatinine with IV fluids and also evaluate for serum     eosinophils and then trend q.6 h. thereafter until her creatinine     is definitively down trending. 3. Send her urine for UA.  Also urine lytes (urine sodium and     creatinine osmolality) and also a urine eosinophil.  Should she     make some more urine and her creatinine is not improving, she would     also need urine microscopy to evaluate for a glomerulonephritis     process. 4. Do not give her any more vancomycin and also please stop the     Celebrex and olmesartan.  Given that my suspicion for AIN is a bit     less likely okay to continue Protonix for now. 5. Please encourage the patient to maintain oral hydration and keep a     jug of water at the bedside.  Also of note, the patient is quite wheezy and I suspect that she likely has underlying COPD.  We would give her a neb at this point and I will communicate that to the nurse.  The patient's PCP is as far as I can tell from the chart is Dr. Georgann Housekeeper, who I believe follows his own patients in the hospital.  I will leave a message with his answering service.  Thank you for this consult.  We appreciate the opportunity to participate in the care of Mrs. Schmelzer.          ______________________________ Carlota Raspberry, MD     EB/MEDQ  D:  01/16/2011  T:  01/16/2011  Job:  [id number]  Electronically Signed by Carlota Raspberry MD on 02/27/2011 12:05:20 PM

## 2011-03-04 LAB — BASIC METABOLIC PANEL
BUN: 10
BUN: 14
BUN: 15
CO2: 26
CO2: 27
CO2: 27
Calcium: 8.4
Calcium: 8.6
Calcium: 9.1
Chloride: 102
Chloride: 104
Chloride: 104
Creatinine, Ser: 1.33 — ABNORMAL HIGH
Creatinine, Ser: 1.54 — ABNORMAL HIGH
Creatinine, Ser: 1.75 — ABNORMAL HIGH
GFR calc Af Amer: 35 — ABNORMAL LOW
GFR calc Af Amer: 41 — ABNORMAL LOW
GFR calc Af Amer: 48 — ABNORMAL LOW
GFR calc non Af Amer: 29 — ABNORMAL LOW
GFR calc non Af Amer: 34 — ABNORMAL LOW
GFR calc non Af Amer: 40 — ABNORMAL LOW
Glucose, Bld: 104 — ABNORMAL HIGH
Glucose, Bld: 104 — ABNORMAL HIGH
Glucose, Bld: 90
Potassium: 3.4 — ABNORMAL LOW
Potassium: 3.8
Potassium: 4.1
Sodium: 137
Sodium: 137
Sodium: 138

## 2011-03-04 LAB — HEMOGLOBIN AND HEMATOCRIT, BLOOD
HCT: 27.9 — ABNORMAL LOW
Hemoglobin: 9.4 — ABNORMAL LOW

## 2011-03-04 LAB — CBC
HCT: 26.1 — ABNORMAL LOW
HCT: 27 — ABNORMAL LOW
HCT: 28.9 — ABNORMAL LOW
Hemoglobin: 9 — ABNORMAL LOW
Hemoglobin: 9.2 — ABNORMAL LOW
Hemoglobin: 9.9 — ABNORMAL LOW
MCHC: 34.1
MCHC: 34.4
MCHC: 34.7
MCV: 83.4
MCV: 83.7
MCV: 84.8
Platelets: 173
Platelets: 195
Platelets: 211
RBC: 3.11 — ABNORMAL LOW
RBC: 3.18 — ABNORMAL LOW
RBC: 3.46 — ABNORMAL LOW
RDW: 15.3
RDW: 15.6 — ABNORMAL HIGH
RDW: 15.7 — ABNORMAL HIGH
WBC: 7
WBC: 8
WBC: 8

## 2011-03-05 LAB — COMPREHENSIVE METABOLIC PANEL
ALT: 22
AST: 26
Albumin: 3.7
Alkaline Phosphatase: 68
BUN: 10
CO2: 28
Calcium: 9.1
Chloride: 100
Creatinine, Ser: 1.46 — ABNORMAL HIGH
GFR calc Af Amer: 44 — ABNORMAL LOW
GFR calc non Af Amer: 36 — ABNORMAL LOW
Glucose, Bld: 99
Potassium: 3.7
Sodium: 136
Total Bilirubin: 0.7
Total Protein: 7.7

## 2011-03-05 LAB — CROSSMATCH
ABO/RH(D): AB POS
Antibody Screen: NEGATIVE

## 2011-03-05 LAB — DIFFERENTIAL
Basophils Absolute: 0
Basophils Relative: 1
Eosinophils Absolute: 0.4
Eosinophils Relative: 7 — ABNORMAL HIGH
Lymphocytes Relative: 39
Lymphs Abs: 2.2
Monocytes Absolute: 0.7
Monocytes Relative: 12
Neutro Abs: 2.3
Neutrophils Relative %: 41 — ABNORMAL LOW

## 2011-03-05 LAB — PROTIME-INR
INR: 1
Prothrombin Time: 13.2

## 2011-03-05 LAB — CBC
HCT: 32 — ABNORMAL LOW
Hemoglobin: 10.7 — ABNORMAL LOW
MCHC: 33.5
MCV: 85.3
Platelets: 292
RBC: 3.76 — ABNORMAL LOW
RDW: 15.2
WBC: 5.6

## 2011-03-05 LAB — URINALYSIS, ROUTINE W REFLEX MICROSCOPIC
Bilirubin Urine: NEGATIVE
Glucose, UA: NEGATIVE
Hgb urine dipstick: NEGATIVE
Ketones, ur: NEGATIVE
Nitrite: NEGATIVE
Protein, ur: NEGATIVE
Specific Gravity, Urine: 1.004 — ABNORMAL LOW
Urobilinogen, UA: 0.2
pH: 7.5

## 2011-03-05 LAB — URINE CULTURE
Colony Count: NO GROWTH
Culture: NO GROWTH

## 2011-03-05 LAB — ABO/RH: ABO/RH(D): AB POS

## 2011-03-05 LAB — APTT: aPTT: 30

## 2011-03-08 LAB — COMPREHENSIVE METABOLIC PANEL
ALT: 207 — ABNORMAL HIGH
ALT: 37 — ABNORMAL HIGH
AST: 157 — ABNORMAL HIGH
AST: 37
Albumin: 3.4 — ABNORMAL LOW
Albumin: 3.7
Alkaline Phosphatase: 126 — ABNORMAL HIGH
Alkaline Phosphatase: 68
BUN: 11
BUN: 18
CO2: 26
CO2: 29
Calcium: 9.3
Calcium: 9.5
Chloride: 101
Chloride: 106
Creatinine, Ser: 1.28 — ABNORMAL HIGH
Creatinine, Ser: 1.41 — ABNORMAL HIGH
GFR calc Af Amer: 45 — ABNORMAL LOW
GFR calc Af Amer: 51 — ABNORMAL LOW
GFR calc non Af Amer: 37 — ABNORMAL LOW
GFR calc non Af Amer: 42 — ABNORMAL LOW
Glucose, Bld: 95
Glucose, Bld: 99
Potassium: 3.5
Potassium: 4.4
Sodium: 138
Sodium: 139
Total Bilirubin: 0.6
Total Bilirubin: 1
Total Protein: 7
Total Protein: 7.5

## 2011-03-08 LAB — CBC
HCT: 33.2 — ABNORMAL LOW
HCT: 35.7 — ABNORMAL LOW
Hemoglobin: 11.1 — ABNORMAL LOW
Hemoglobin: 11.9 — ABNORMAL LOW
MCHC: 33.3
MCHC: 33.5
MCV: 85.8
MCV: 86.6
Platelets: 229
Platelets: 267
RBC: 3.83 — ABNORMAL LOW
RBC: 4.16
RDW: 14.8 — ABNORMAL HIGH
RDW: 15.3 — ABNORMAL HIGH
WBC: 5.7
WBC: 5.9

## 2011-03-08 LAB — AMYLASE: Amylase: 68

## 2011-03-23 IMAGING — CT CT CERVICAL SPINE W/O CM
3 of 6 series · 11 of 27 positions shown, 12 images · non-contrast
Comparison: None.

CT HEAD

CLINICAL DATA: Left neck pain and headache.

CT HEAD WITHOUT CONTRAST
CT CERVICAL SPINE WITHOUT CONTRAST
TECHNIQUE: Multidetector CT imaging of the head and cervical spine
was performed following the standard protocol without intravenous
contrast.  Multiplanar CT image reconstructions of the cervical
spine were also generated.

[Series 4: cervical spine · axial · 0.27mm/px · z∈[-49,+36]mm · 3 of 70 slices shown, 4 images]
[im 18/70  soft-tissue]
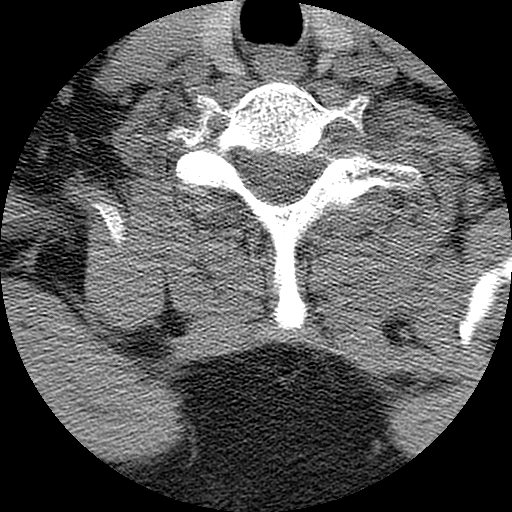
[im 18/70  bone]
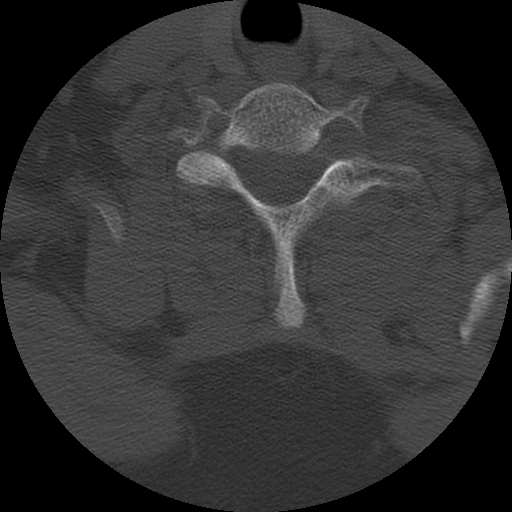
[im 35/70  bone]
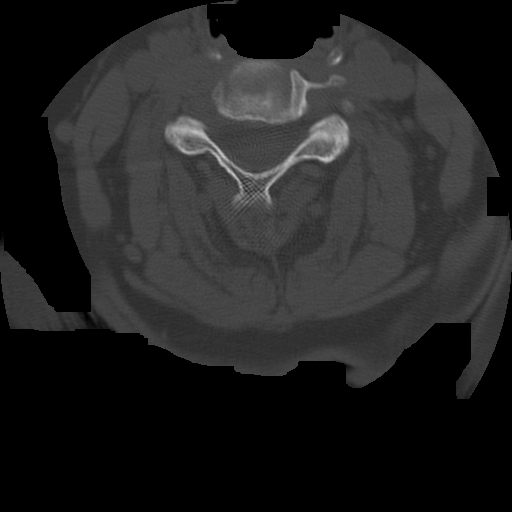
[im 52/70  bone]
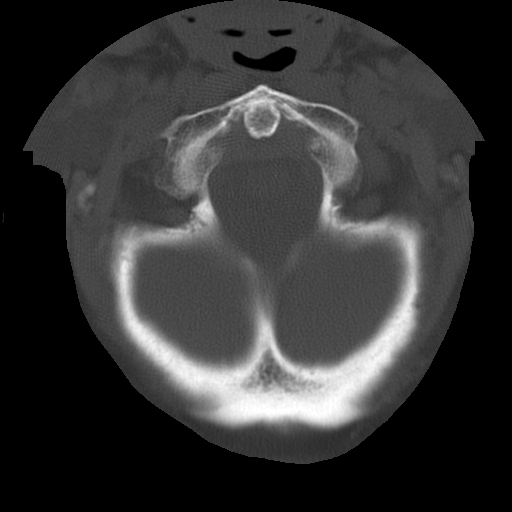

[Series 5: recon 2: cervical spine · axial · 0.27mm/px · z∈[-49,+36]mm · 3 of 70 slices shown]
[im 18/70  bone]
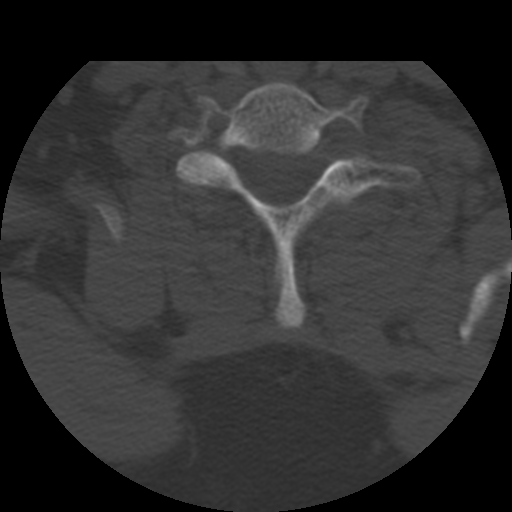
[im 35/70  bone]
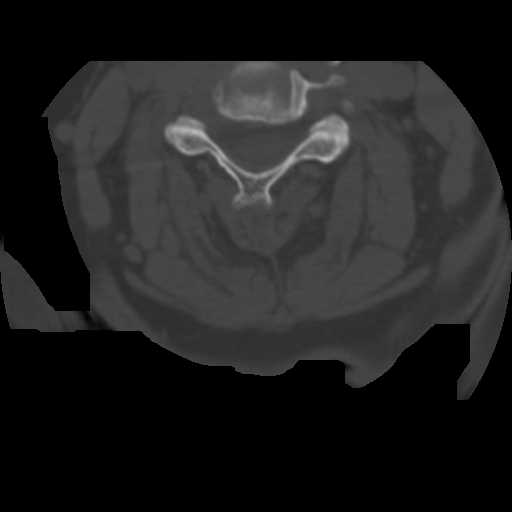
[im 52/70  bone]
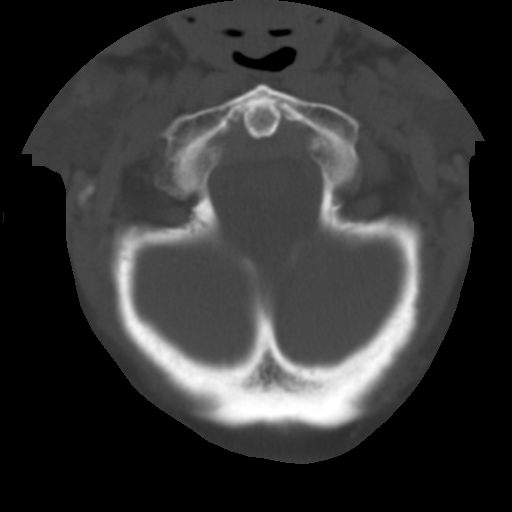

[Series 600: cor · coronal · 0.35mm/px · 5 of 49 slices shown]
[im 9/49  bone]
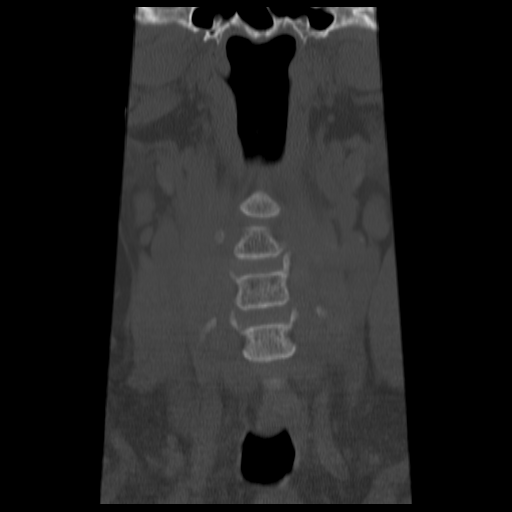
[im 17/49  bone]
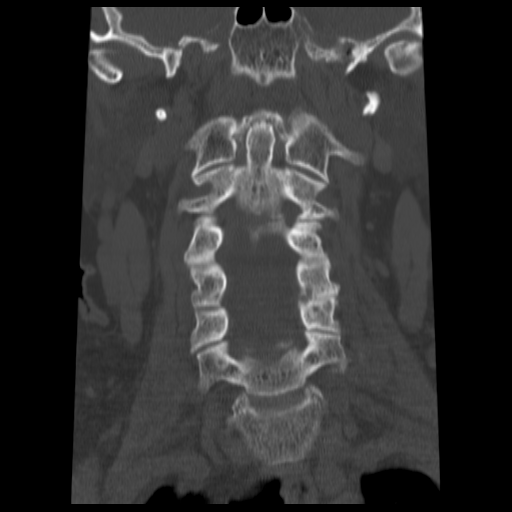
[im 25/49  bone]
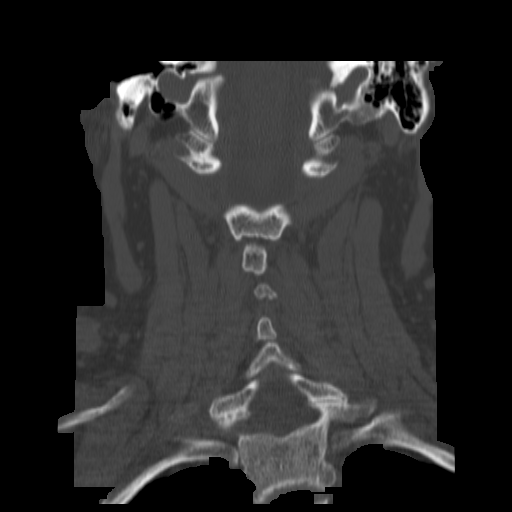
[im 33/49  bone]
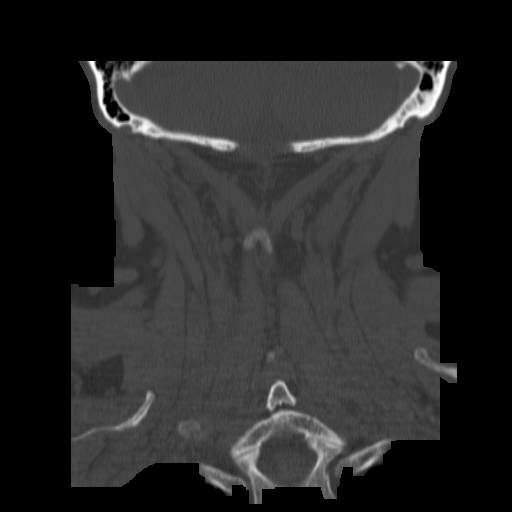
[im 41/49  bone]
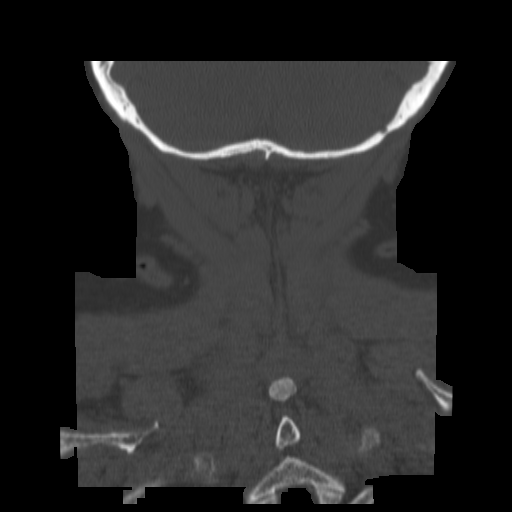

[11 of 27 positions shown; findings below may reference images not displayed]

FINDINGS: The brain stem, cerebellum, cerebral peduncles, thalami,
basal ganglia, basilar cisterns, and ventricular system appear
unremarkable.

There is some ossification along the falx.

No intracranial hemorrhage, mass lesion, or acute infarction is
identified.

There is greater than expected localized posterior convex curvature
of the globes, unlikely to be related to the patient's current
symptoms.
IMPRESSION: 1.  No significant intracranial abnormality is identified.
2.  Incidental note is made of a mildly accentuated posterior
convexity of the globes.

CT CERVICAL SPINE
FINDINGS: Facet arthropathy noted on the left at C2-3 and C4-5.
There is a small erosion along the anterior base of the dens.
There is evidence of pannus posterior to the dens.

No fracture or acute subluxation is identified.
IMPRESSION: 1.  No acute cervical spine findings.
2.  Cervical spondylosis.
3.  Small erosion along the anterior dens, with pannus posterior to
the dens.

## 2011-03-26 NOTE — Discharge Summary (Signed)
[name], [name] NO.:  [id number]  MEDICAL RECORD NO.:  [id number]  LOCATION:  5022                         FACILITY:  [facility]  PHYSICIAN:  Mila Homer. [name], M.D. DATE OF BIRTH:  1941-01-30  DATE OF ADMISSION:  01/14/2011 DATE OF DISCHARGE:  01/18/2011                              DISCHARGE SUMMARY   ADMISSION DIAGNOSIS:  Failed right total knee arthroplasty.  DISCHARGE DIAGNOSIS: 1. Failed right total knee arthroplasty. 2. Status post right total knee revision. 3. Acute blood loss anemia, status post surgery.  PROCEDURE:  Right total knee revision.  HISTORY:  The patient is a 70 year old female complained of pain in the right knee, status post TKA.  Conservative treatment has failed.  Bone scan showed evidence of loosening.  Risk and benefits of the surgery were discussed with the patient.  The patient would like to again with scheduled for a revision.  ALLERGIES:  The patient is allergic to SULFA.  ADMISSION MEDICATIONS:  Upon admission to the hospital, the patient was taken: 1. Ibuprofen 200 mg twice a day every 8 hours. 2. Albuterol nebulizer 1.25 inhaled daily every 4 hours as needed. 3. Amitriptyline 25 mg one tablet daily at bedtime. 4. Albuterol inhaler, inhaled two puffs every day 4 hours as needed. 5. Nexium 40 mg daily. 6. Vitamin B12 1000 mcg daily. 7. Senna daily. 8. Diovan 100 mg daily. 9. Celexa 40 mg daily. 10.Vicodin three times a day as needed. 11.Amlodipine 10 mg daily. 12.Colace 400 mg daily.  HOSPITAL COURSE:  This is a 70 year old female admitted on January 14, 2011, after appropriate laboratory studies were obtained preoperatively as well as Ancef on-call to the operating room.  She was taken to the OR where she underwent a right total knee revision, tolerated the procedure well, was taken to the PACU in good condition.  The patient was placed on p.o. pain medication as well as IV Dilaudid as needed.  Foley was placed  intraoperatively.  On postop day #1, vital signs stable.  The patient denied chest pain, shortness breath, or calf pain.  The patient was started on Lovenox 30 mg subcu q.12 h. at 8 a.m.  Consults with PT, OT and Care Management were made.  The patient was weightbearing as tolerated.  CPM 0-90 degrees for 6-8 hours per day.  Incentive spirometry was taught.  On postop day #3, the patient continued to progress very slowly with physical therapy.  Dressing was changed.  Marcaine pump and Hemovac were discontinued.  Foley was discontinued.  The patient was continued on p.o. and IV pain medication.  On postop day #3, the patient did continue to progress with physical therapy, but not to the level that we like to send her home.  We are contemplated SNF at this point.  The patient decided to try to work hard on physical therapy.  On postop day #4, the patient did very well with physical therapy and was able to go home after Lovenox teaching.  DISCHARGE INSTRUCTIONS:  There are no restrictions to diet.  The patient is to follow the wound care sheet that was included in her discharge. Increase activity slowly.  May use a cane or  walker, weightbearing as tolerated.  No lifting or driving for 6 weeks.  Home health has been established.  DISCHARGE MEDICATIONS:  Upon discharge from the hospital, the patient was given prescriptions for 1. Lovenox 40 mg inject once subcutaneously daily. 2. Robaxin 500 mg one to two tablets every 6-8 hours as needed for     pain. 3. Oxycodone 5 mg one to two tablets every 4-6 hours as needed for     pain.  The patient will follow up with Dr. Sherlean [name] in 2 weeks.  Call for the appointment at [phone].  Discharged in improved condition.    ______________________________ Nicole Cabal, PA-C   ______________________________ Mila Homer. [name], M.D.    MJ/MEDQ  D:  02/13/2011  T:  02/14/2011  Job:  [id number]  Electronically Signed by Nicole Cabal PA-C on  03/20/2011 01:32:38 PM Electronically Signed by Georgena Spurling M.D. on 03/26/2011 05:26:38 PM

## 2011-05-30 ENCOUNTER — Other Ambulatory Visit: Payer: Self-pay | Admitting: Internal Medicine

## 2011-05-30 DIAGNOSIS — R609 Edema, unspecified: Secondary | ICD-10-CM | POA: Diagnosis not present

## 2011-05-30 DIAGNOSIS — R52 Pain, unspecified: Secondary | ICD-10-CM

## 2011-05-31 ENCOUNTER — Ambulatory Visit
Admission: RE | Admit: 2011-05-31 | Discharge: 2011-05-31 | Disposition: A | Discharge status: Home / Self Care | Payer: Medicare Other | Source: Ambulatory Visit | Attending: Internal Medicine | Admitting: Internal Medicine

## 2011-05-31 DIAGNOSIS — R609 Edema, unspecified: Secondary | ICD-10-CM

## 2011-05-31 DIAGNOSIS — R52 Pain, unspecified: Secondary | ICD-10-CM

## 2011-06-20 DIAGNOSIS — R609 Edema, unspecified: Secondary | ICD-10-CM | POA: Diagnosis not present

## 2011-06-20 DIAGNOSIS — I10 Essential (primary) hypertension: Secondary | ICD-10-CM | POA: Diagnosis not present

## 2011-06-26 ENCOUNTER — Other Ambulatory Visit: Payer: Self-pay | Admitting: Internal Medicine

## 2011-06-26 ENCOUNTER — Ambulatory Visit
Admission: RE | Admit: 2011-06-26 | Discharge: 2011-06-26 | Disposition: A | Discharge status: Home / Self Care | Payer: Medicare Other | Source: Ambulatory Visit | Attending: Internal Medicine | Admitting: Internal Medicine

## 2011-06-26 DIAGNOSIS — R51 Headache: Secondary | ICD-10-CM | POA: Diagnosis not present

## 2011-06-26 DIAGNOSIS — S0990XA Unspecified injury of head, initial encounter: Secondary | ICD-10-CM | POA: Diagnosis not present

## 2011-06-26 DIAGNOSIS — W19XXXA Unspecified fall, initial encounter: Secondary | ICD-10-CM

## 2011-07-01 DIAGNOSIS — R112 Nausea with vomiting, unspecified: Secondary | ICD-10-CM | POA: Diagnosis not present

## 2011-07-01 DIAGNOSIS — R109 Unspecified abdominal pain: Secondary | ICD-10-CM | POA: Diagnosis not present

## 2011-07-09 ENCOUNTER — Other Ambulatory Visit: Payer: Self-pay | Admitting: Gastroenterology

## 2011-07-09 DIAGNOSIS — R1310 Dysphagia, unspecified: Secondary | ICD-10-CM | POA: Diagnosis not present

## 2011-07-09 DIAGNOSIS — R1115 Cyclical vomiting syndrome unrelated to migraine: Secondary | ICD-10-CM | POA: Diagnosis not present

## 2011-07-10 ENCOUNTER — Ambulatory Visit
Admission: RE | Admit: 2011-07-10 | Discharge: 2011-07-10 | Disposition: A | Discharge status: Home / Self Care | Payer: Medicare Other | Source: Ambulatory Visit | Attending: Gastroenterology | Admitting: Gastroenterology

## 2011-07-10 DIAGNOSIS — R1310 Dysphagia, unspecified: Secondary | ICD-10-CM | POA: Diagnosis not present

## 2011-07-29 DIAGNOSIS — R1013 Epigastric pain: Secondary | ICD-10-CM | POA: Diagnosis not present

## 2011-07-29 DIAGNOSIS — R1110 Vomiting, unspecified: Secondary | ICD-10-CM | POA: Diagnosis not present

## 2011-07-29 DIAGNOSIS — R1310 Dysphagia, unspecified: Secondary | ICD-10-CM | POA: Diagnosis not present

## 2011-07-29 DIAGNOSIS — K3189 Other diseases of stomach and duodenum: Secondary | ICD-10-CM | POA: Diagnosis not present

## 2011-08-14 DIAGNOSIS — L570 Actinic keratosis: Secondary | ICD-10-CM | POA: Diagnosis not present

## 2011-08-14 DIAGNOSIS — D239 Other benign neoplasm of skin, unspecified: Secondary | ICD-10-CM | POA: Diagnosis not present

## 2011-08-14 DIAGNOSIS — D485 Neoplasm of uncertain behavior of skin: Secondary | ICD-10-CM | POA: Diagnosis not present

## 2011-08-14 DIAGNOSIS — C4491 Basal cell carcinoma of skin, unspecified: Secondary | ICD-10-CM | POA: Diagnosis not present

## 2011-09-08 ENCOUNTER — Encounter (HOSPITAL_COMMUNITY): Payer: Self-pay

## 2011-09-08 ENCOUNTER — Emergency Department (HOSPITAL_COMMUNITY)
Admission: EM | Admit: 2011-09-08 | Discharge: 2011-09-08 | Disposition: A | Discharge status: Home / Self Care | Payer: Medicare Other | Attending: Emergency Medicine | Admitting: Emergency Medicine

## 2011-09-08 DIAGNOSIS — R1110 Vomiting, unspecified: Secondary | ICD-10-CM

## 2011-09-08 DIAGNOSIS — R000 Tachycardia, unspecified: Secondary | ICD-10-CM | POA: Diagnosis not present

## 2011-09-08 DIAGNOSIS — R109 Unspecified abdominal pain: Secondary | ICD-10-CM | POA: Insufficient documentation

## 2011-09-08 DIAGNOSIS — R1115 Cyclical vomiting syndrome unrelated to migraine: Secondary | ICD-10-CM | POA: Insufficient documentation

## 2011-09-08 DIAGNOSIS — R110 Nausea: Secondary | ICD-10-CM | POA: Diagnosis not present

## 2011-09-08 LAB — URINE MICROSCOPIC-ADD ON

## 2011-09-08 LAB — CBC
HCT: 47.1 % — ABNORMAL HIGH (ref 36.0–46.0)
Hemoglobin: 15.8 g/dL — ABNORMAL HIGH (ref 12.0–15.0)
MCH: 29.3 pg (ref 26.0–34.0)
MCHC: 33.5 g/dL (ref 30.0–36.0)
MCV: 87.4 fL (ref 78.0–100.0)
Platelets: 102 10*3/uL — ABNORMAL LOW (ref 150–400)
RBC: 5.39 MIL/uL — ABNORMAL HIGH (ref 3.87–5.11)
RDW: 14.6 % (ref 11.5–15.5)
WBC: 6.6 10*3/uL (ref 4.0–10.5)

## 2011-09-08 LAB — COMPREHENSIVE METABOLIC PANEL WITH GFR
ALT: 19 U/L (ref 0–35)
AST: 30 U/L (ref 0–37)
Albumin: 3.8 g/dL (ref 3.5–5.2)
Alkaline Phosphatase: 102 U/L (ref 39–117)
BUN: 8 mg/dL (ref 6–23)
CO2: 23 meq/L (ref 19–32)
Calcium: 10.1 mg/dL (ref 8.4–10.5)
Chloride: 103 meq/L (ref 96–112)
Creatinine, Ser: 1.15 mg/dL — ABNORMAL HIGH (ref 0.50–1.10)
GFR calc Af Amer: 55 mL/min — ABNORMAL LOW
GFR calc non Af Amer: 47 mL/min — ABNORMAL LOW
Glucose, Bld: 96 mg/dL (ref 70–99)
Potassium: 4.8 meq/L (ref 3.5–5.1)
Sodium: 143 meq/L (ref 135–145)
Total Bilirubin: 0.4 mg/dL (ref 0.3–1.2)
Total Protein: 8.4 g/dL — ABNORMAL HIGH (ref 6.0–8.3)

## 2011-09-08 LAB — DIFFERENTIAL
Basophils Absolute: 0 K/uL (ref 0.0–0.1)
Basophils Relative: 1 % (ref 0–1)
Eosinophils Absolute: 0.1 K/uL (ref 0.0–0.7)
Eosinophils Relative: 2 % (ref 0–5)
Lymphocytes Relative: 27 % (ref 12–46)
Lymphs Abs: 1.8 K/uL (ref 0.7–4.0)
Monocytes Absolute: 0.5 K/uL (ref 0.1–1.0)
Monocytes Relative: 8 % (ref 3–12)
Neutro Abs: 4.2 K/uL (ref 1.7–7.7)
Neutrophils Relative %: 64 % (ref 43–77)

## 2011-09-08 LAB — URINALYSIS, ROUTINE W REFLEX MICROSCOPIC
Bilirubin Urine: NEGATIVE
Glucose, UA: NEGATIVE mg/dL
Hgb urine dipstick: NEGATIVE
Ketones, ur: NEGATIVE mg/dL
Nitrite: NEGATIVE
Protein, ur: NEGATIVE mg/dL
Specific Gravity, Urine: 1.012 (ref 1.005–1.030)
Urobilinogen, UA: 1 mg/dL (ref 0.0–1.0)
pH: 7.5 (ref 5.0–8.0)

## 2011-09-08 LAB — LIPASE, BLOOD: Lipase: 18 U/L (ref 11–59)

## 2011-09-08 LAB — POCT I-STAT TROPONIN I: Troponin i, poc: 0 ng/mL (ref 0.00–0.08)

## 2011-09-08 MED ORDER — ONDANSETRON HCL 4 MG/2ML IJ SOLN
4.0000 mg | Freq: Once | INTRAMUSCULAR | Status: AC
  Administered 2011-09-08: 4 mg via INTRAVENOUS
  Filled 2011-09-08: qty 2

## 2011-09-08 MED ORDER — PROMETHAZINE HCL 25 MG/ML IJ SOLN
25.0000 mg | INTRAMUSCULAR | Status: AC
  Administered 2011-09-08: 25 mg via INTRAVENOUS
  Filled 2011-09-08: qty 1

## 2011-09-08 MED ORDER — SODIUM CHLORIDE 0.9 % IV BOLUS (SEPSIS): 1000.0000 mL | Freq: Once | INTRAVENOUS | Status: DC

## 2011-09-08 MED ORDER — HYOSCYAMINE SULFATE 0.125 MG SL SUBL
0.2500 mg | SUBLINGUAL_TABLET | Freq: Once | SUBLINGUAL | Status: DC
  Filled 2011-09-08: qty 2

## 2011-09-08 MED ORDER — HYOSCYAMINE SULFATE 0.125 MG SL SUBL: 0.1250 mg | SUBLINGUAL_TABLET | SUBLINGUAL | Status: DC | PRN

## 2011-09-08 MED ORDER — METOCLOPRAMIDE HCL 10 MG PO TABS: 10.0000 mg | ORAL_TABLET | Freq: Four times a day (QID) | ORAL | Status: DC

## 2011-09-08 MED ORDER — SODIUM CHLORIDE 0.9 % IV BOLUS (SEPSIS)
500.0000 mL | Freq: Once | INTRAVENOUS | Status: AC
  Administered 2011-09-08: 1000 mL via INTRAVENOUS

## 2011-09-08 MED ORDER — HYOSCYAMINE SULFATE 0.125 MG PO TABS
0.2500 mg | ORAL_TABLET | Freq: Once | ORAL | Status: DC
  Filled 2011-09-08: qty 2

## 2011-09-08 NOTE — Discharge Instructions (Signed)
Your blood tests do not show any significant illness.   Use reglan for nausea and vomiting and levsin for abdominal pain.  Follow up with your doctor as needed.  Return for worse or uncontrolled symptoms.

## 2011-09-08 NOTE — ED Notes (Signed)
Pt daughter Lynden Ang  [phone] Pt sister Corrie Dandy  [phone]

## 2011-09-08 NOTE — ED Notes (Signed)
Opt. Woke up with n/v last night,  Pt. Reports having n/v all her life.

## 2011-09-08 NOTE — ED Provider Notes (Addendum)
History     CSN: [id number]  Arrival date & time 09/08/11  2130   First MD Initiated Contact with Patient 09/08/11 1012      Chief Complaint  Patient presents with  . Nausea    (Consider location/radiation/quality/duration/timing/severity/associated sxs/prior treatment) The history is provided by the patient.   the patient is a 71 year old, female, who smokes cigarettes.  She has a history of hiatal hernia.  He presents emergency department complaining of nausea, vomiting, and abdominal pain since last night.  She denies diarrhea.  She denies cough, or shortness of breath.  Denies alcohol use.  She has not had antibiotics recently.  She says the abdominal pain is from the persistent vomiting.  She denies a history of abdominal surgery  No past medical history on file.  No past surgical history on file.  No family history on file.  History  Substance Use Topics  . Smoking status: Current Everyday Smoker  . Smokeless tobacco: Not on file  . Alcohol Use: No    OB History    Grav Para Term Preterm Abortions TAB SAB Ect Mult Living                  Review of Systems  Constitutional: Negative for fever, chills and diaphoresis.  Respiratory: Negative for cough and shortness of breath.   Cardiovascular: Negative for chest pain.  Gastrointestinal: Positive for nausea, vomiting and abdominal pain. Negative for diarrhea.  Genitourinary: Negative for dysuria and hematuria.  Neurological: Negative for headaches.  Psychiatric/Behavioral: Negative for confusion.  All other systems reviewed and are negative.    Allergies  Review of patient's allergies indicates not on file.  Home Medications  No current outpatient prescriptions on file.  BP 109/58  Pulse 117  Temp(Src) 97.2 F (36.2 C) (Oral)  Resp 22  SpO2 97%  Physical Exam  Vitals reviewed. Constitutional: She is oriented to person, place, and time. She appears well-developed and well-nourished. No distress.   Uncomfortable appearing  HENT:  Head: Normocephalic and atraumatic.  Eyes: Conjunctivae and EOM are normal.  Neck: Normal range of motion. Neck supple.  Cardiovascular: Regular rhythm.   No murmur heard.      tachycardia  Pulmonary/Chest: Effort normal. No respiratory distress.  Abdominal: Soft. There is tenderness. There is no rebound and no guarding.  Musculoskeletal: Normal range of motion.  Neurological: She is alert and oriented to person, place, and time.  Skin: Skin is warm and dry.  Psychiatric: She has a normal mood and affect. Thought content normal.    ED Course  Procedures (including critical care time) 75 y female with n/v tachyardia.  Will check labs, including trop since she smokes.   Start iv and give meds for n/v.   Suspect gastritis.     Labs Reviewed  CBC  DIFFERENTIAL  BASIC METABOLIC PANEL  URINALYSIS, ROUTINE W REFLEX MICROSCOPIC  COMPREHENSIVE METABOLIC PANEL  LIPASE, BLOOD   No results found.   No diagnosis found.  ED ECG REPORT   Date: 09/08/2011  EKG Time: 11:27 AM  Rate: 87  Rhythm: normal sinus rhythm,    Axis: normal  Intervals:none  ST&T Change: nonspecific tw changes  Narrative Interpretation: nsr with inf. q waves and nonspecific tw changes          3:40 PM Feels better. Laughing and joking with family.  Hr controlled.   MDM  N/v tachycardia and abd pain from vomiting.        Cheri Guppy, MD 09/08/11  1531  Cheri Guppy, MD 09/08/11 1540

## 2011-09-10 DIAGNOSIS — H353190 Nonexudative age-related macular degeneration, unspecified eye, stage unspecified: Secondary | ICD-10-CM | POA: Diagnosis not present

## 2011-09-10 DIAGNOSIS — H269 Unspecified cataract: Secondary | ICD-10-CM | POA: Diagnosis not present

## 2011-09-10 DIAGNOSIS — H5210 Myopia, unspecified eye: Secondary | ICD-10-CM | POA: Diagnosis not present

## 2011-09-11 ENCOUNTER — Other Ambulatory Visit: Payer: Self-pay | Admitting: Gastroenterology

## 2011-09-11 DIAGNOSIS — R1013 Epigastric pain: Secondary | ICD-10-CM | POA: Diagnosis not present

## 2011-09-11 DIAGNOSIS — R1033 Periumbilical pain: Secondary | ICD-10-CM | POA: Diagnosis not present

## 2011-09-11 DIAGNOSIS — R1110 Vomiting, unspecified: Secondary | ICD-10-CM | POA: Diagnosis not present

## 2011-09-11 DIAGNOSIS — D696 Thrombocytopenia, unspecified: Secondary | ICD-10-CM | POA: Diagnosis not present

## 2011-09-13 ENCOUNTER — Ambulatory Visit
Admission: RE | Admit: 2011-09-13 | Discharge: 2011-09-13 | Disposition: A | Discharge status: Home / Self Care | Payer: Medicare Other | Source: Ambulatory Visit | Attending: Gastroenterology | Admitting: Gastroenterology

## 2011-09-13 DIAGNOSIS — K219 Gastro-esophageal reflux disease without esophagitis: Secondary | ICD-10-CM | POA: Diagnosis not present

## 2011-09-13 DIAGNOSIS — R109 Unspecified abdominal pain: Secondary | ICD-10-CM | POA: Diagnosis not present

## 2011-09-13 DIAGNOSIS — K5732 Diverticulitis of large intestine without perforation or abscess without bleeding: Secondary | ICD-10-CM | POA: Diagnosis not present

## 2011-09-17 DIAGNOSIS — M25569 Pain in unspecified knee: Secondary | ICD-10-CM | POA: Diagnosis not present

## 2011-09-20 DIAGNOSIS — F17200 Nicotine dependence, unspecified, uncomplicated: Secondary | ICD-10-CM | POA: Diagnosis not present

## 2011-09-20 DIAGNOSIS — M19079 Primary osteoarthritis, unspecified ankle and foot: Secondary | ICD-10-CM | POA: Diagnosis not present

## 2011-09-20 DIAGNOSIS — M25579 Pain in unspecified ankle and joints of unspecified foot: Secondary | ICD-10-CM | POA: Diagnosis not present

## 2011-10-01 DIAGNOSIS — I789 Disease of capillaries, unspecified: Secondary | ICD-10-CM | POA: Diagnosis not present

## 2011-10-01 DIAGNOSIS — C4401 Basal cell carcinoma of skin of lip: Secondary | ICD-10-CM | POA: Diagnosis not present

## 2011-10-01 DIAGNOSIS — L908 Other atrophic disorders of skin: Secondary | ICD-10-CM | POA: Diagnosis not present

## 2011-10-02 ENCOUNTER — Other Ambulatory Visit: Payer: Self-pay | Admitting: Internal Medicine

## 2011-10-02 DIAGNOSIS — E538 Deficiency of other specified B group vitamins: Secondary | ICD-10-CM | POA: Diagnosis not present

## 2011-10-02 DIAGNOSIS — F329 Major depressive disorder, single episode, unspecified: Secondary | ICD-10-CM | POA: Diagnosis not present

## 2011-10-02 DIAGNOSIS — I10 Essential (primary) hypertension: Secondary | ICD-10-CM | POA: Diagnosis not present

## 2011-10-02 DIAGNOSIS — E785 Hyperlipidemia, unspecified: Secondary | ICD-10-CM | POA: Diagnosis not present

## 2011-10-02 DIAGNOSIS — J449 Chronic obstructive pulmonary disease, unspecified: Secondary | ICD-10-CM | POA: Diagnosis not present

## 2011-10-02 DIAGNOSIS — K219 Gastro-esophageal reflux disease without esophagitis: Secondary | ICD-10-CM | POA: Diagnosis not present

## 2011-10-02 DIAGNOSIS — F17200 Nicotine dependence, unspecified, uncomplicated: Secondary | ICD-10-CM | POA: Diagnosis not present

## 2011-10-02 DIAGNOSIS — Z1231 Encounter for screening mammogram for malignant neoplasm of breast: Secondary | ICD-10-CM

## 2011-10-02 DIAGNOSIS — Z1331 Encounter for screening for depression: Secondary | ICD-10-CM | POA: Diagnosis not present

## 2011-10-04 DIAGNOSIS — H43819 Vitreous degeneration, unspecified eye: Secondary | ICD-10-CM | POA: Diagnosis not present

## 2011-10-04 DIAGNOSIS — H5210 Myopia, unspecified eye: Secondary | ICD-10-CM | POA: Diagnosis not present

## 2011-10-04 DIAGNOSIS — H2510 Age-related nuclear cataract, unspecified eye: Secondary | ICD-10-CM | POA: Diagnosis not present

## 2011-10-09 DIAGNOSIS — D510 Vitamin B12 deficiency anemia due to intrinsic factor deficiency: Secondary | ICD-10-CM | POA: Diagnosis not present

## 2011-10-09 DIAGNOSIS — L259 Unspecified contact dermatitis, unspecified cause: Secondary | ICD-10-CM | POA: Diagnosis not present

## 2011-10-09 DIAGNOSIS — R209 Unspecified disturbances of skin sensation: Secondary | ICD-10-CM | POA: Diagnosis not present

## 2011-10-11 DIAGNOSIS — E538 Deficiency of other specified B group vitamins: Secondary | ICD-10-CM | POA: Diagnosis not present

## 2011-10-30 ENCOUNTER — Ambulatory Visit: Payer: Medicare Other

## 2011-10-30 DIAGNOSIS — K219 Gastro-esophageal reflux disease without esophagitis: Secondary | ICD-10-CM | POA: Diagnosis not present

## 2011-10-30 DIAGNOSIS — R42 Dizziness and giddiness: Secondary | ICD-10-CM | POA: Diagnosis not present

## 2011-10-30 DIAGNOSIS — I10 Essential (primary) hypertension: Secondary | ICD-10-CM | POA: Diagnosis not present

## 2011-10-30 DIAGNOSIS — D510 Vitamin B12 deficiency anemia due to intrinsic factor deficiency: Secondary | ICD-10-CM | POA: Diagnosis not present

## 2011-10-30 DIAGNOSIS — F17200 Nicotine dependence, unspecified, uncomplicated: Secondary | ICD-10-CM | POA: Diagnosis not present

## 2011-10-30 DIAGNOSIS — M810 Age-related osteoporosis without current pathological fracture: Secondary | ICD-10-CM | POA: Diagnosis not present

## 2011-11-04 ENCOUNTER — Ambulatory Visit: Payer: Medicare Other

## 2011-12-04 ENCOUNTER — Ambulatory Visit: Payer: Medicare Other

## 2011-12-11 IMAGING — US US EXTREM LOW VENOUS*L*
1 series · 14 of 24 positions shown · non-contrast
Comparison: None.

CLINICAL DATA: Edema of the left leg

INFANT HEAD ULTRASOUND
TECHNIQUE: Ultrasound evaluation of the brain was performed
following the standard protocol using the anterior fontanelle as an
acoustic window.

[Series 1: us extrem low venous*left* · 14 of 34 slices shown]
[im 1/34]
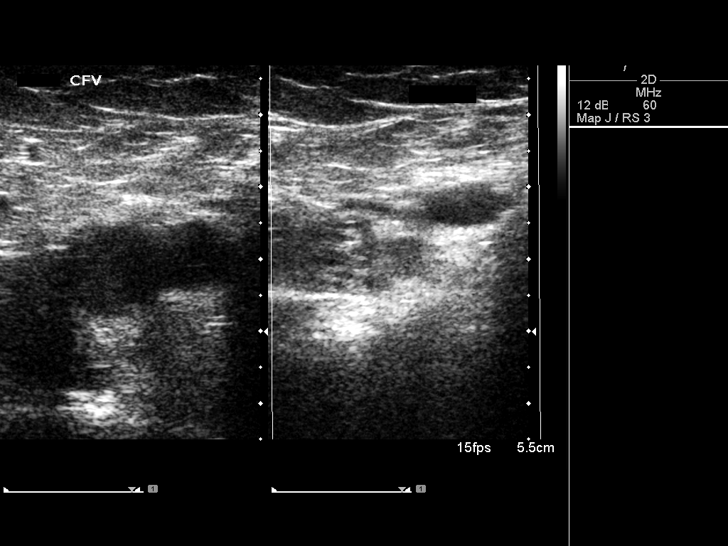
[im 3/34]
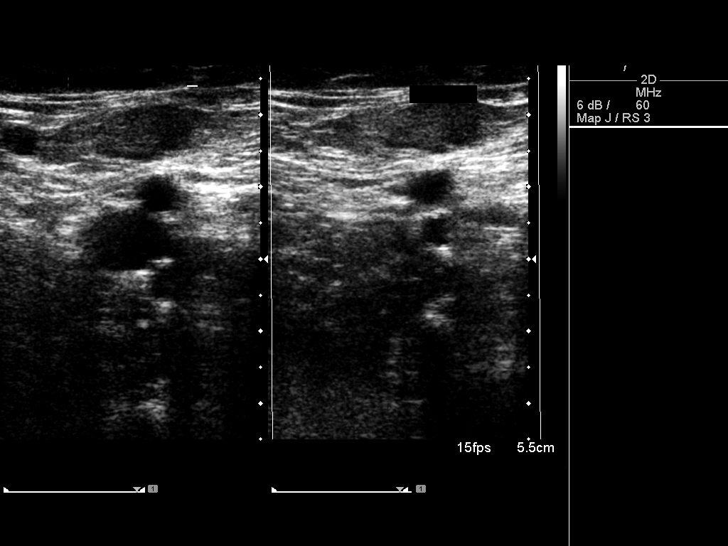
[im 6/34]
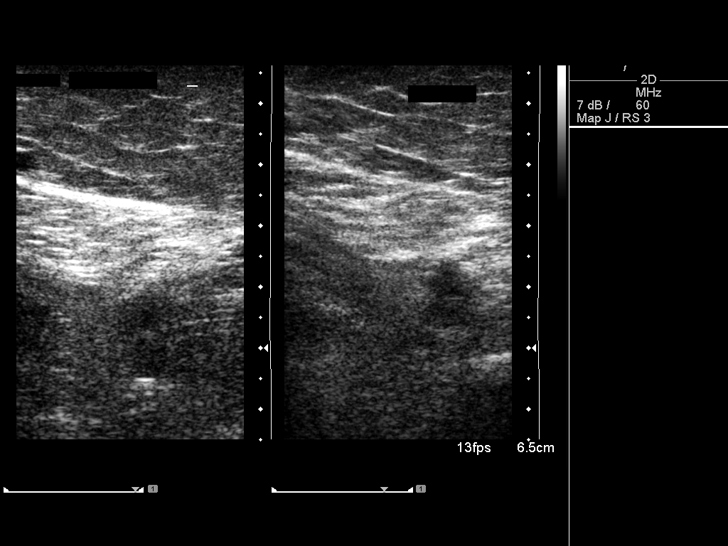
[im 9/34]
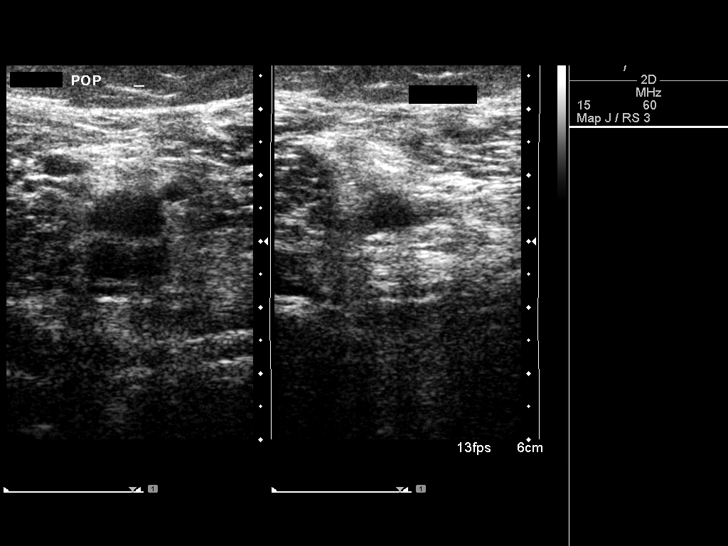
[im 11/34]
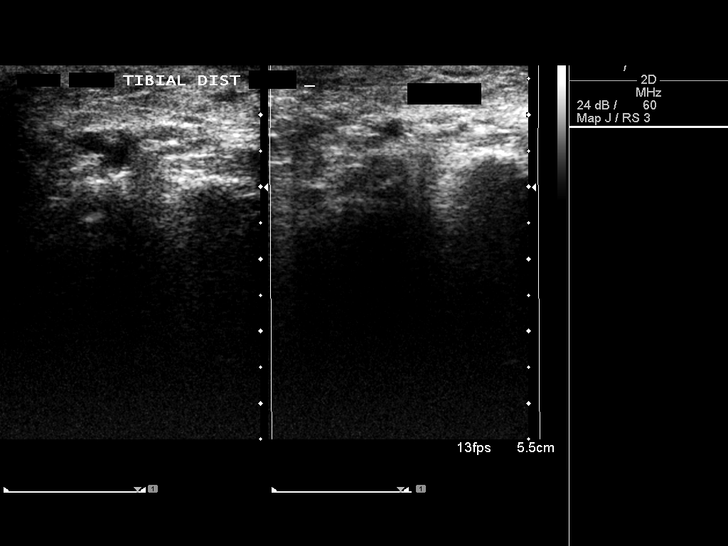
[im 13/34]
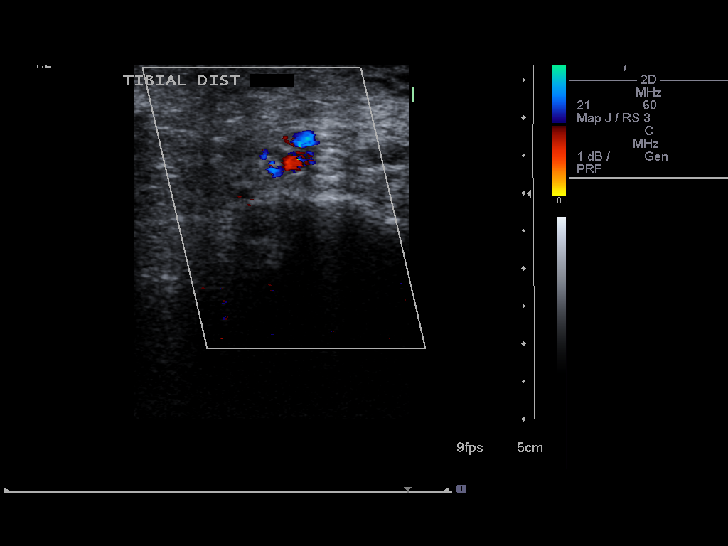
[im 16/34]
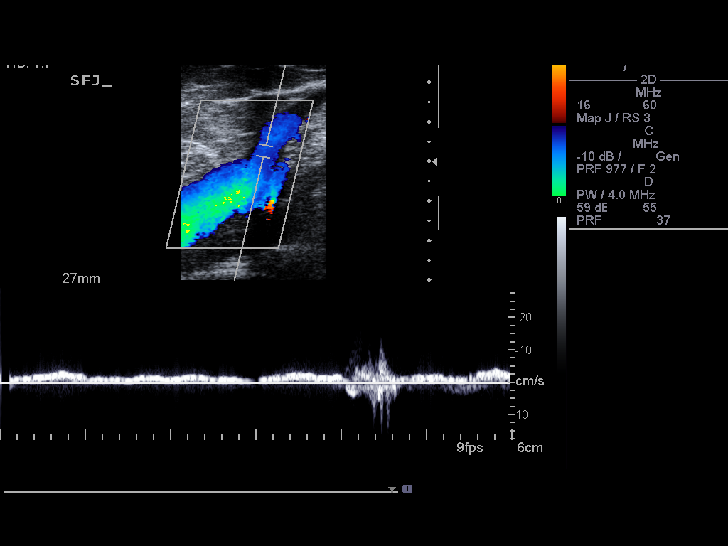
[im 18/34]
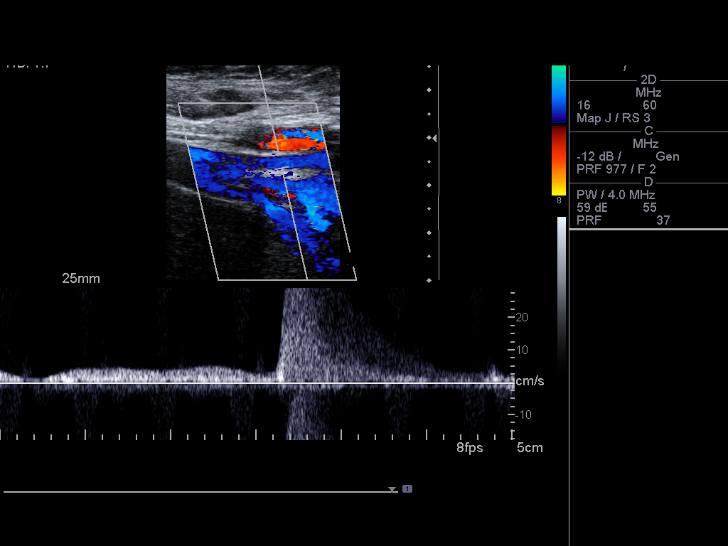
[im 21/34]
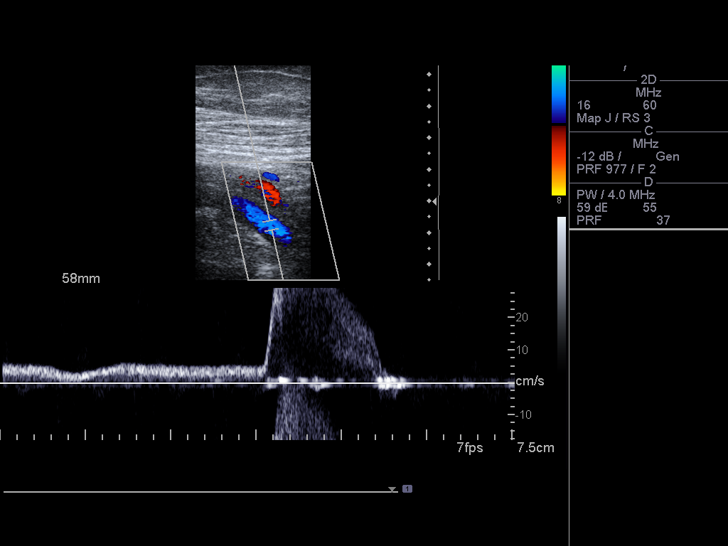
[im 23/34]
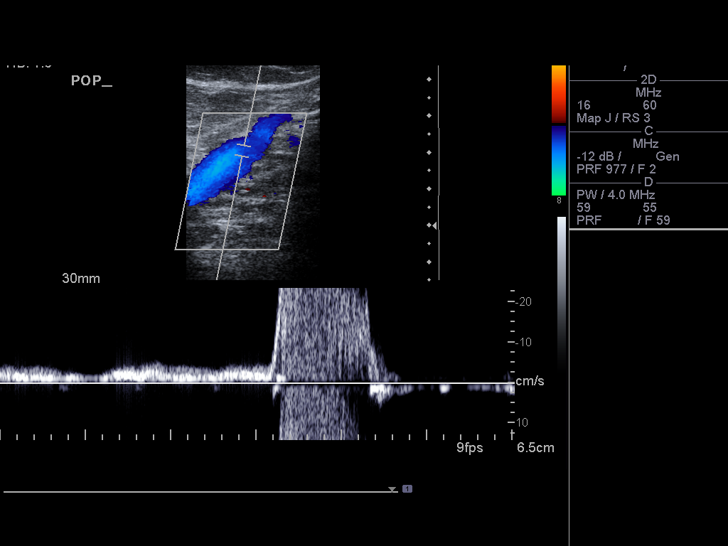
[im 26/34]
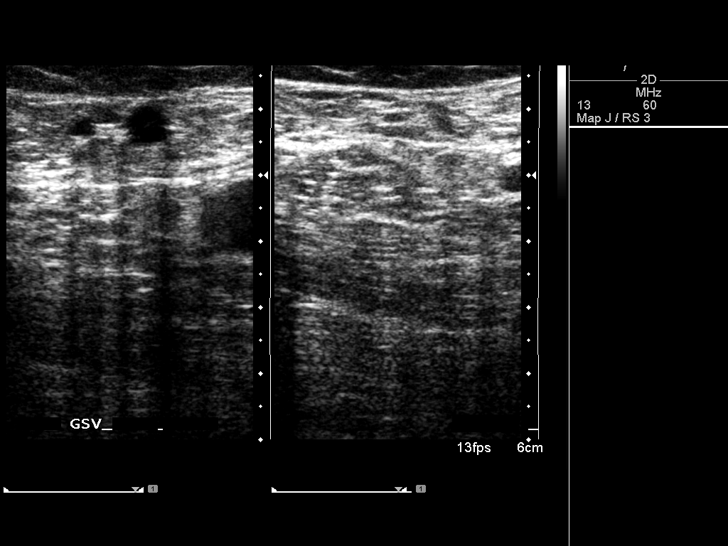
[im 28/34]
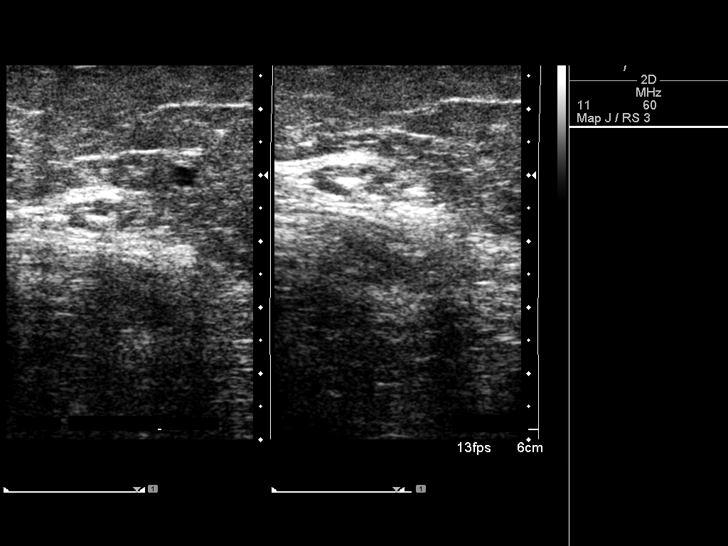
[im 31/34]
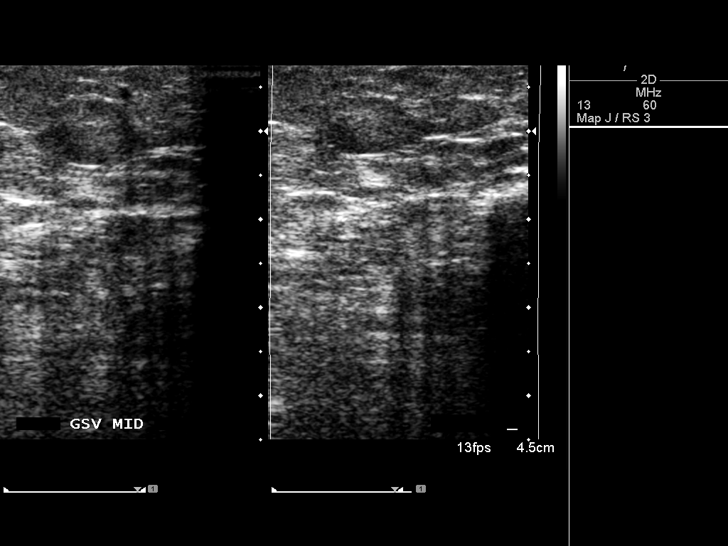
[im 34/34]
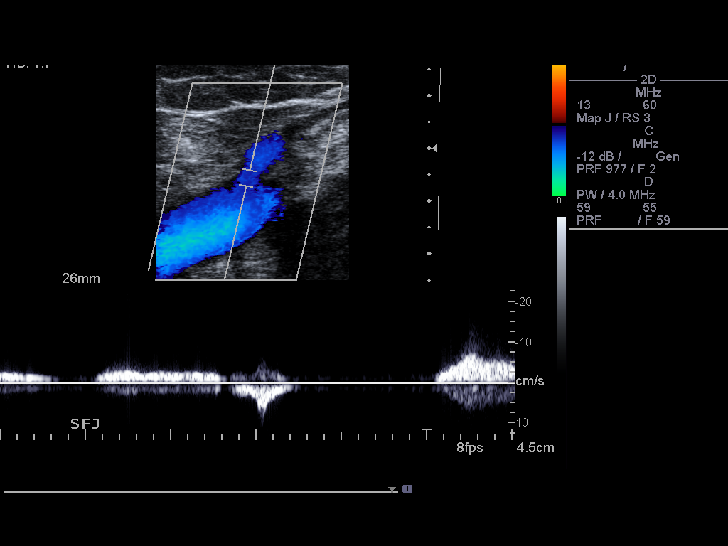

[14 of 24 positions shown; findings below may reference images not displayed]

FINDINGS: The left saphenous - femoral junction, left common
femoral vein, left profunda femoral vein, left superficial femoral
vein, and left popliteal vein all compress and augment normally.
There is no evidence of deep venous thrombosis.  No superficial
thrombophlebitis is seen.
IMPRESSION: Negative ultrasound of the left leg for DVT.

## 2011-12-12 IMAGING — CR DG ANKLE COMPLETE 3+V*L*
3 series · 3 of 3 positions shown · non-contrast
Comparison: None.

CLINICAL DATA: Fell down stairs - pain and ankle

LEFT ANKLE COMPLETE - 3+ VIEW

[t ankle joint ap left]
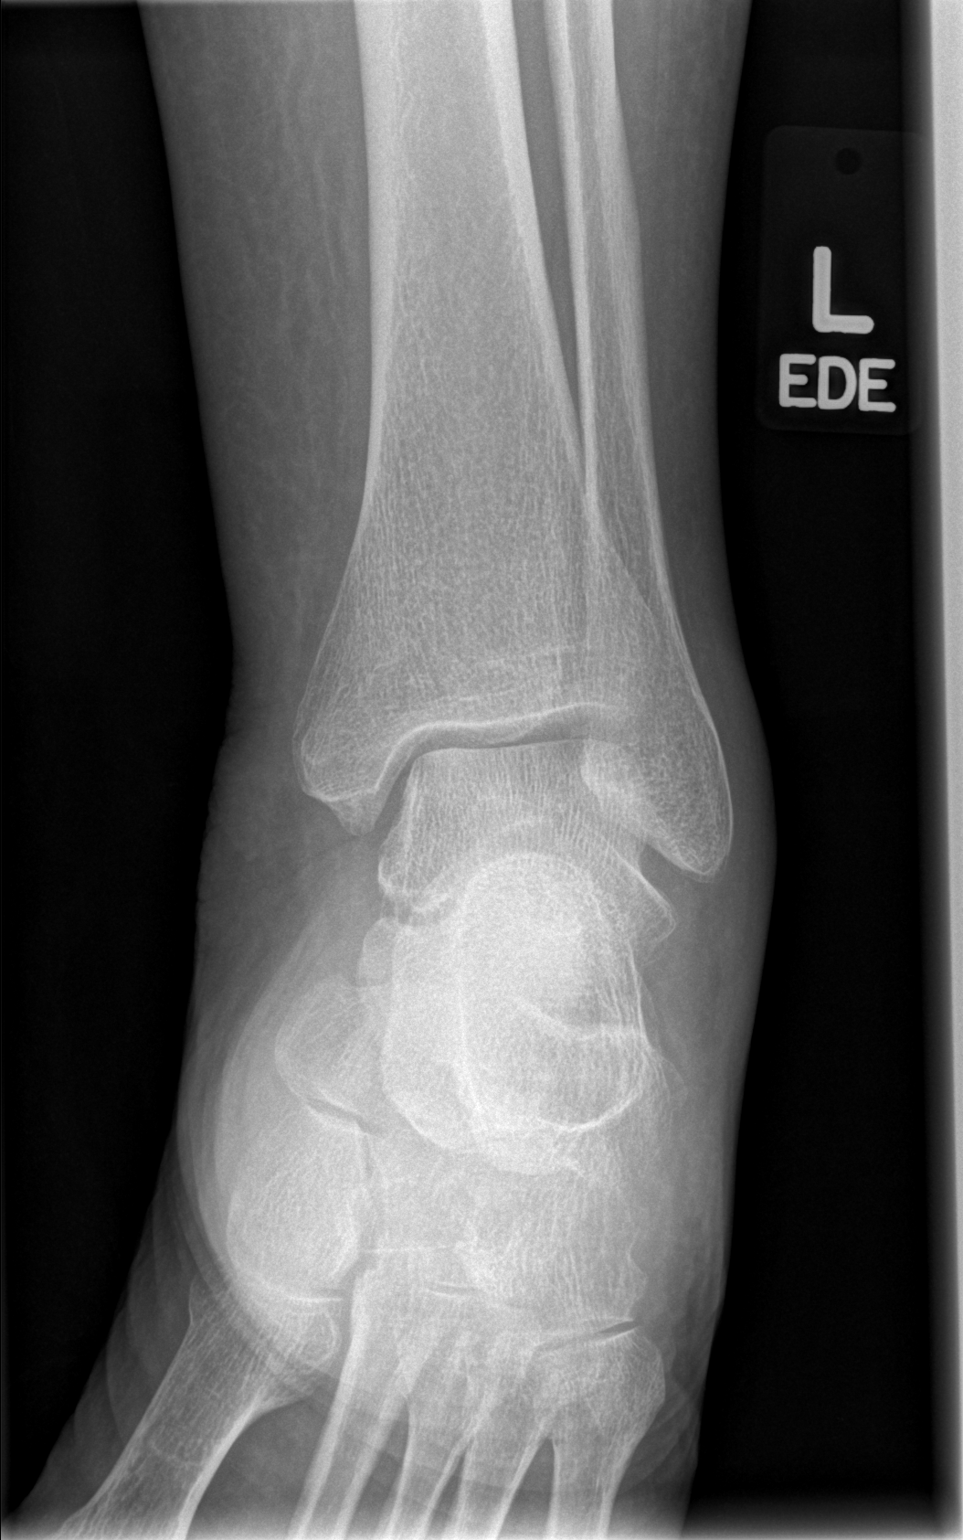

[t ankle joint oblique left]
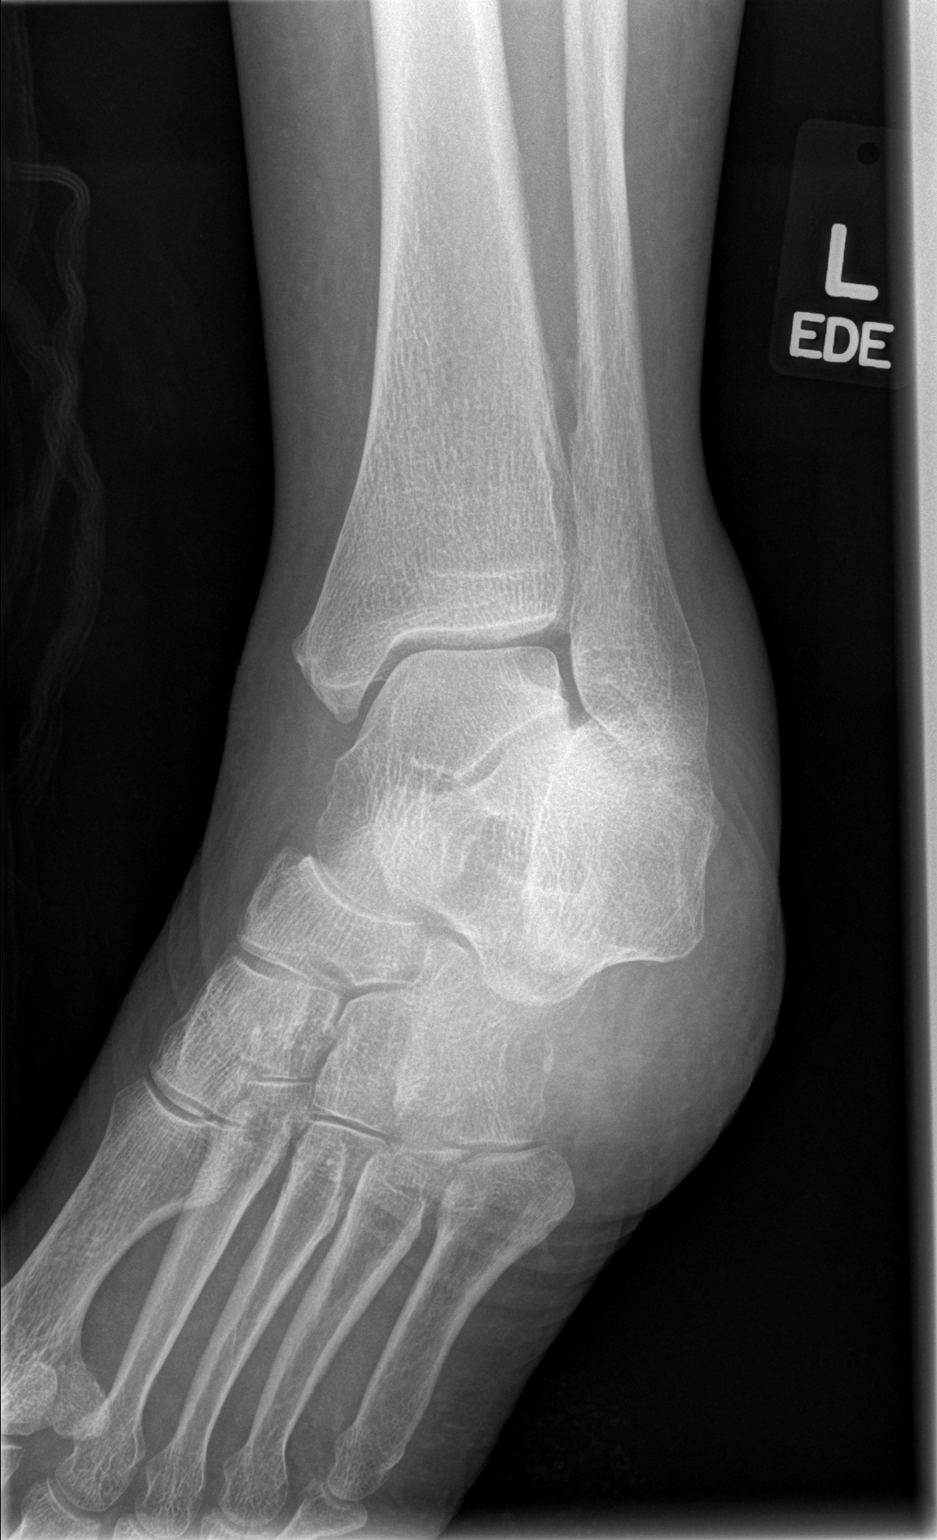

[t ankle joint lat left]
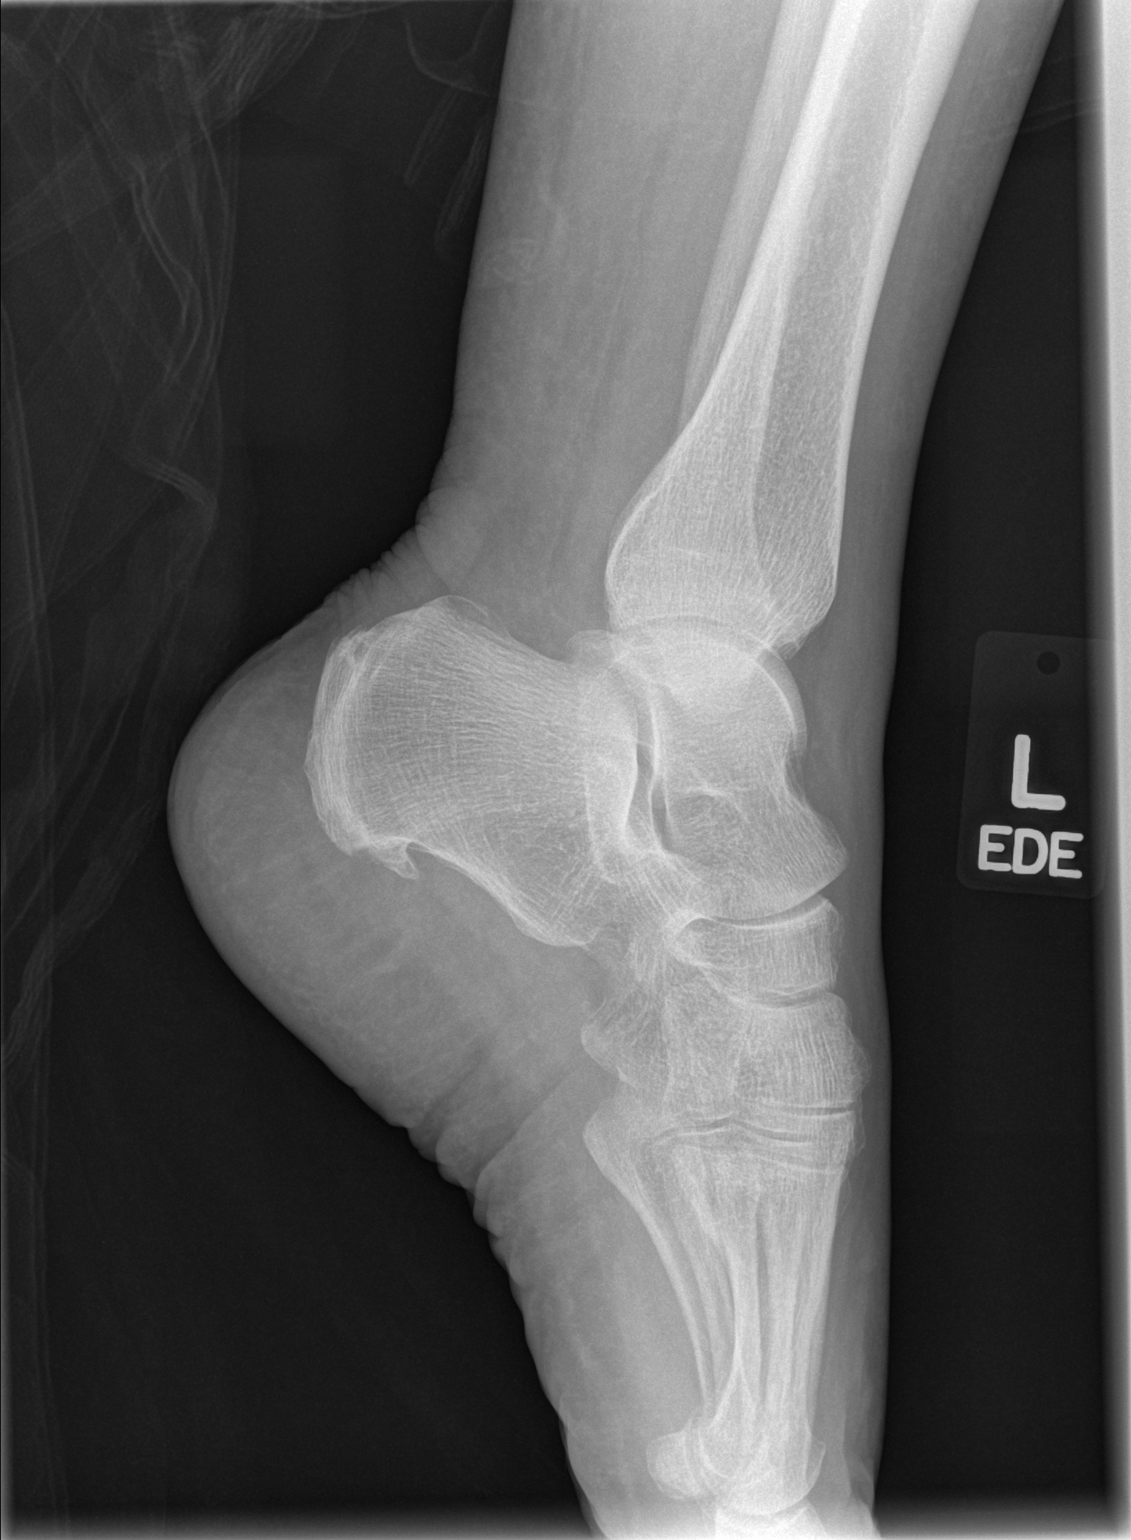

[3 of 3 positions shown; findings below may reference images not displayed]

FINDINGS: There is a medial soft tissue swelling.  No fracture or
dislocation.  Bones mildly demineralized.
IMPRESSION: Normal except for medial soft tissue swelling.

## 2011-12-12 IMAGING — CR DG FOOT COMPLETE 3+V*L*
3 series · 3 of 3 positions shown · non-contrast
Comparison: None.

CLINICAL DATA: Fell down stairs - laceration in the region of the
fifth metatarsal

LEFT FOOT - COMPLETE 3+ VIEW

[t foot lat left]
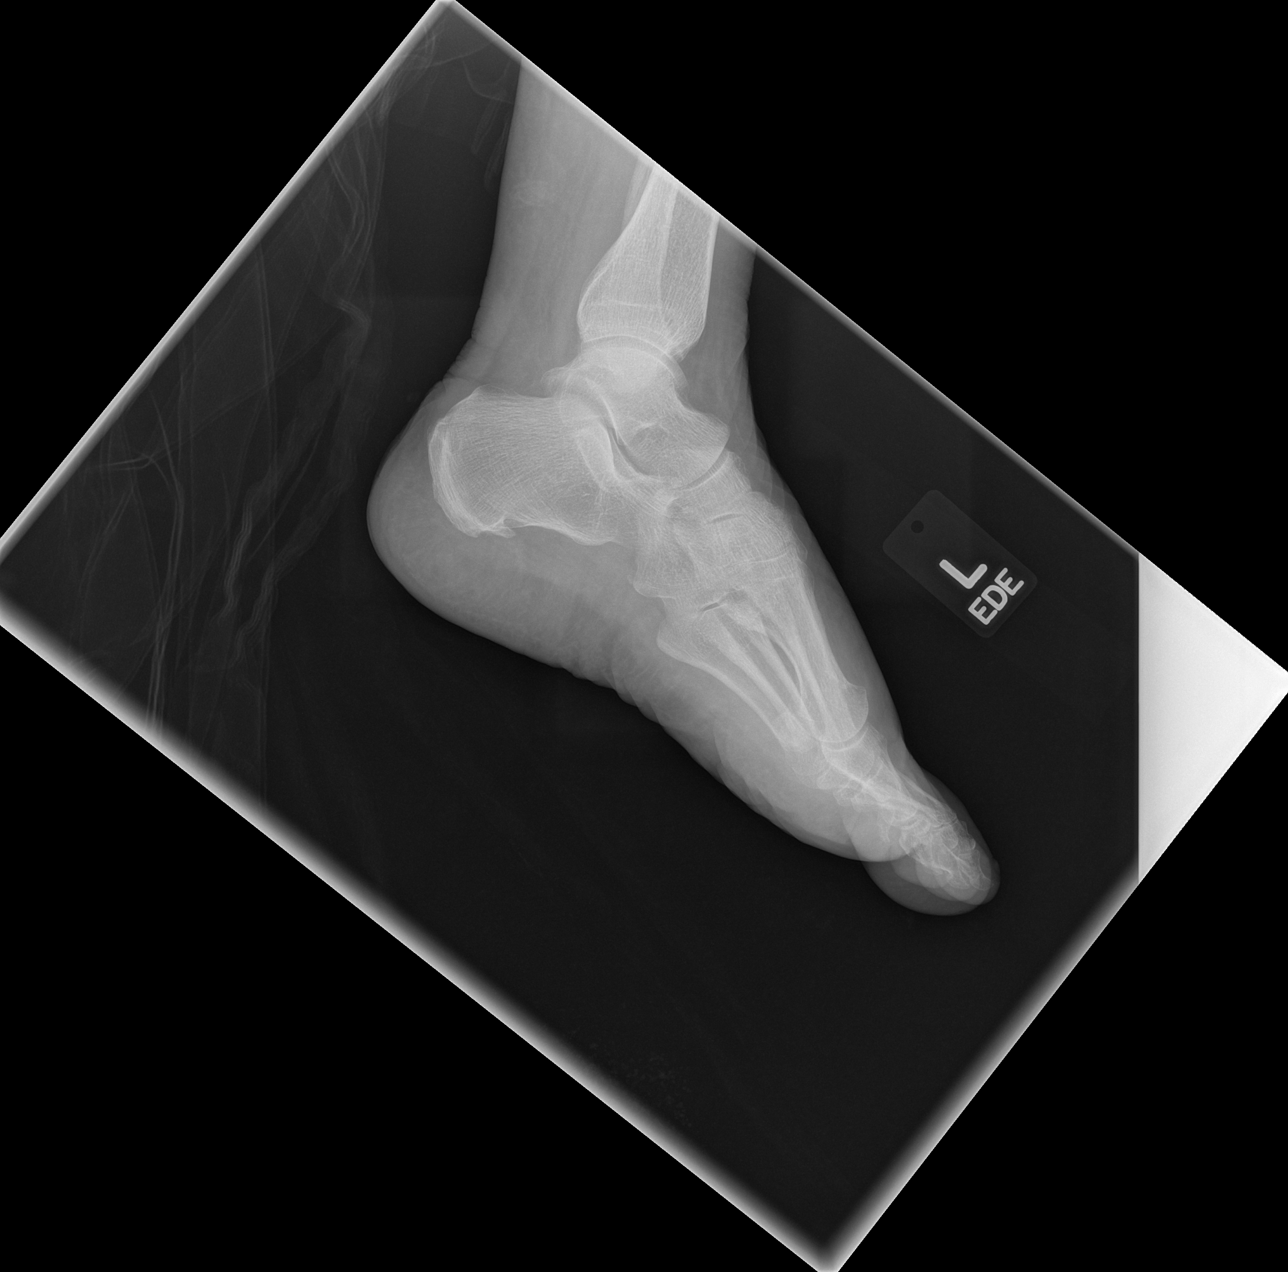

[t foot ap left]
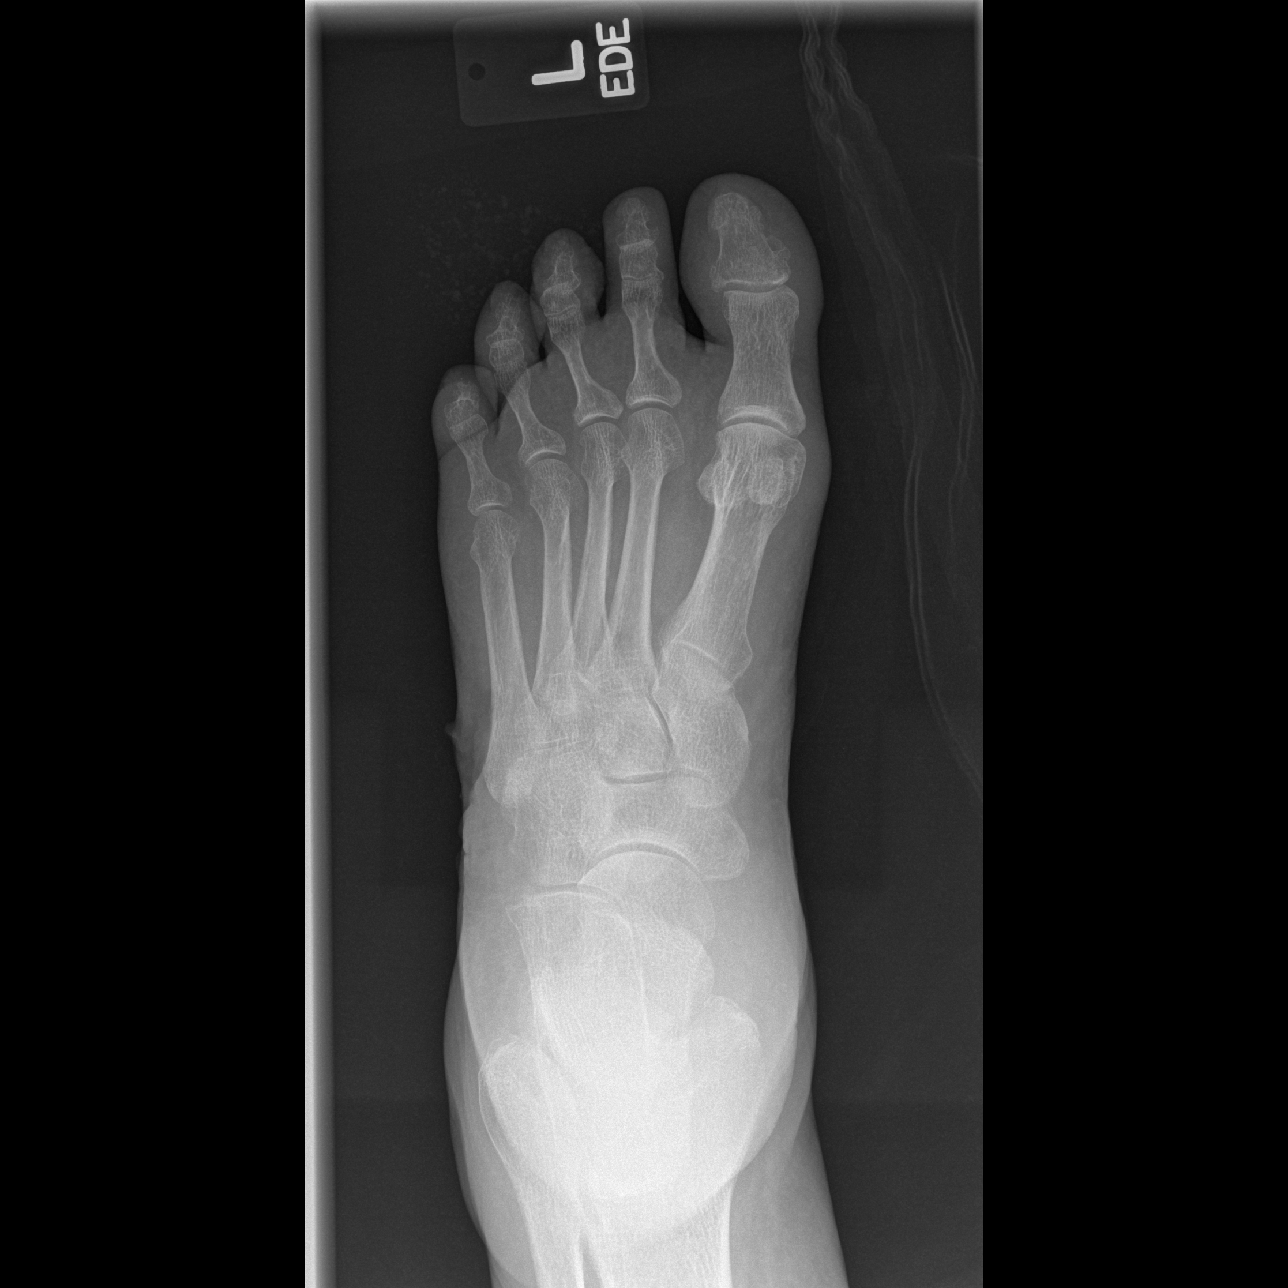

[t foot oblique left]
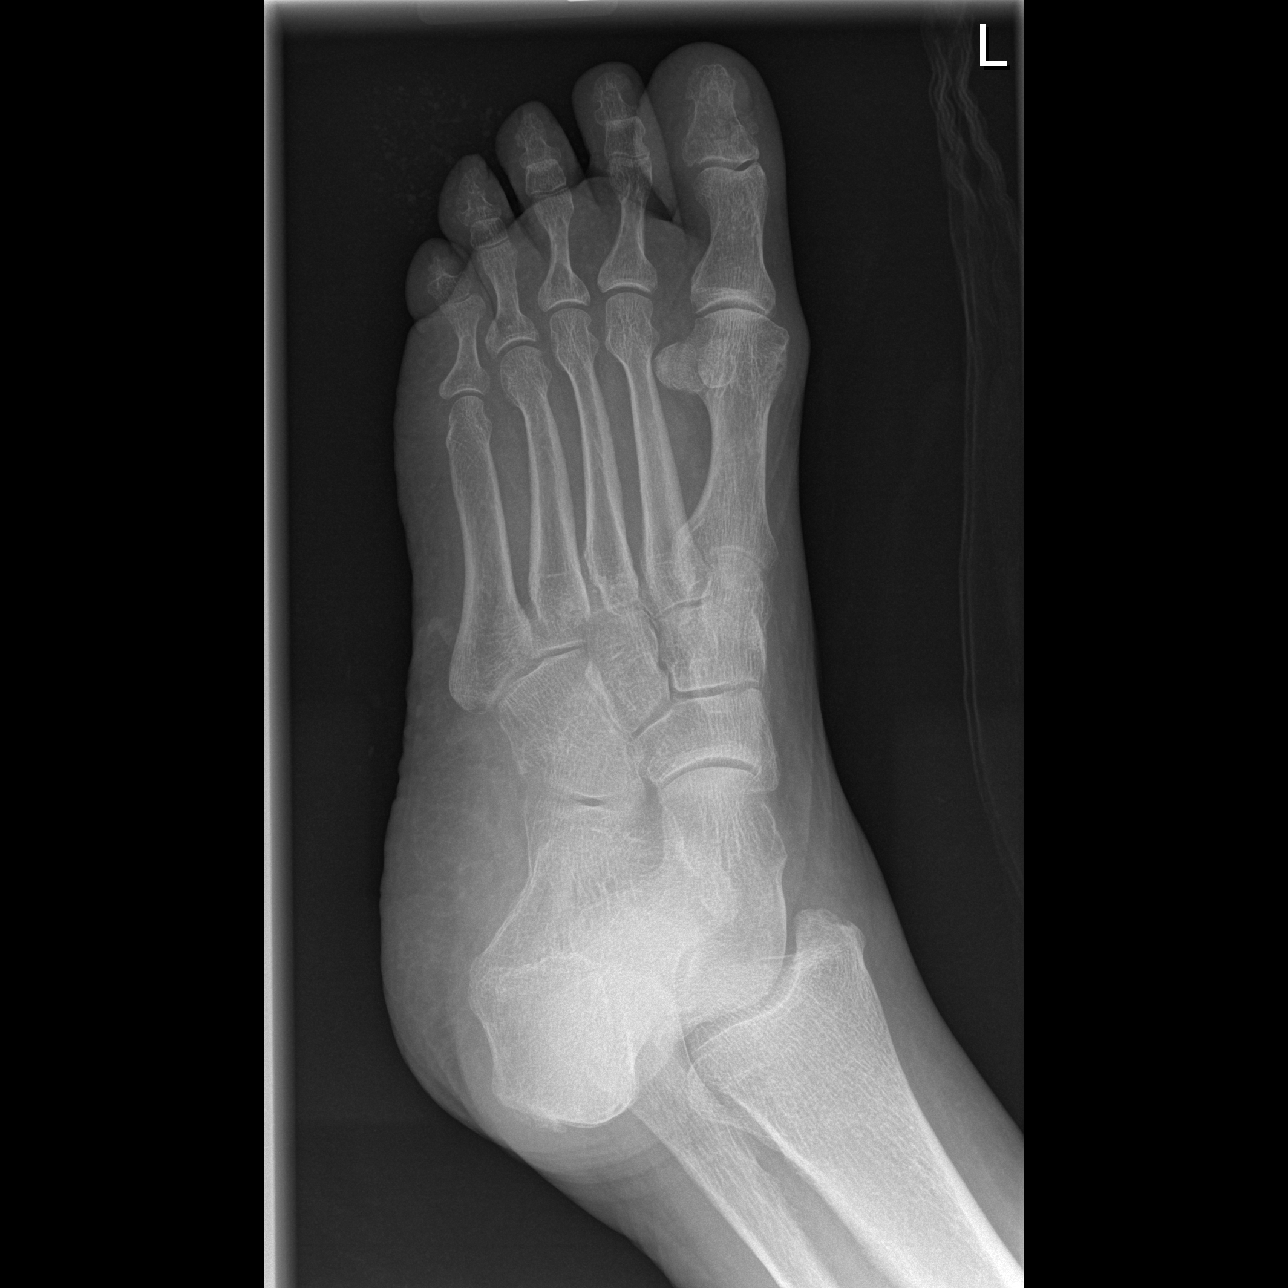

[3 of 3 positions shown; findings below may reference images not displayed]

FINDINGS: No fracture or dislocation.  There is a skin defect over
the base of the fifth metatarsal.  No foreign body in the soft
tissues.  Small calcaneal spur.  Bones somewhat demineralized.
IMPRESSION: No acute findings.  There is a laceration near the base of the
fifth metatarsal.

## 2011-12-25 ENCOUNTER — Ambulatory Visit
Admission: RE | Admit: 2011-12-25 | Discharge: 2011-12-25 | Disposition: A | Discharge status: Home / Self Care | Payer: Medicare Other | Source: Ambulatory Visit | Attending: Internal Medicine | Admitting: Internal Medicine

## 2011-12-25 DIAGNOSIS — Z1231 Encounter for screening mammogram for malignant neoplasm of breast: Secondary | ICD-10-CM | POA: Diagnosis not present

## 2012-02-10 DIAGNOSIS — I10 Essential (primary) hypertension: Secondary | ICD-10-CM | POA: Diagnosis not present

## 2012-02-10 DIAGNOSIS — D510 Vitamin B12 deficiency anemia due to intrinsic factor deficiency: Secondary | ICD-10-CM | POA: Diagnosis not present

## 2012-02-10 DIAGNOSIS — K219 Gastro-esophageal reflux disease without esophagitis: Secondary | ICD-10-CM | POA: Diagnosis not present

## 2012-02-10 DIAGNOSIS — N183 Chronic kidney disease, stage 3 unspecified: Secondary | ICD-10-CM | POA: Diagnosis not present

## 2012-02-10 DIAGNOSIS — Z23 Encounter for immunization: Secondary | ICD-10-CM | POA: Diagnosis not present

## 2012-02-10 DIAGNOSIS — J449 Chronic obstructive pulmonary disease, unspecified: Secondary | ICD-10-CM | POA: Diagnosis not present

## 2012-02-10 DIAGNOSIS — E782 Mixed hyperlipidemia: Secondary | ICD-10-CM | POA: Diagnosis not present

## 2012-02-10 DIAGNOSIS — F329 Major depressive disorder, single episode, unspecified: Secondary | ICD-10-CM | POA: Diagnosis not present

## 2012-02-10 DIAGNOSIS — E669 Obesity, unspecified: Secondary | ICD-10-CM | POA: Diagnosis not present

## 2012-03-09 DIAGNOSIS — L570 Actinic keratosis: Secondary | ICD-10-CM | POA: Diagnosis not present

## 2012-03-09 DIAGNOSIS — Z85828 Personal history of other malignant neoplasm of skin: Secondary | ICD-10-CM | POA: Diagnosis not present

## 2012-03-13 DIAGNOSIS — R42 Dizziness and giddiness: Secondary | ICD-10-CM | POA: Diagnosis not present

## 2012-03-16 ENCOUNTER — Other Ambulatory Visit: Payer: Self-pay | Admitting: Diagnostic Neuroimaging

## 2012-03-16 DIAGNOSIS — R42 Dizziness and giddiness: Secondary | ICD-10-CM

## 2012-03-23 ENCOUNTER — Other Ambulatory Visit (HOSPITAL_COMMUNITY): Payer: Self-pay | Admitting: Diagnostic Neuroimaging

## 2012-03-23 DIAGNOSIS — R42 Dizziness and giddiness: Secondary | ICD-10-CM

## 2012-03-24 ENCOUNTER — Other Ambulatory Visit (HOSPITAL_COMMUNITY): Payer: Medicare Other

## 2012-03-25 ENCOUNTER — Encounter (HOSPITAL_COMMUNITY): Payer: Self-pay | Admitting: Diagnostic Neuroimaging

## 2012-03-27 ENCOUNTER — Ambulatory Visit: Payer: Medicare Other | Admitting: Physical Therapy

## 2012-03-30 ENCOUNTER — Ambulatory Visit
Admission: RE | Admit: 2012-03-30 | Discharge: 2012-03-30 | Disposition: A | Discharge status: Home / Self Care | Payer: Medicare Other | Source: Ambulatory Visit | Attending: Diagnostic Neuroimaging | Admitting: Diagnostic Neuroimaging

## 2012-03-30 ENCOUNTER — Other Ambulatory Visit: Payer: Self-pay | Admitting: Diagnostic Neuroimaging

## 2012-03-30 DIAGNOSIS — R42 Dizziness and giddiness: Secondary | ICD-10-CM

## 2012-03-31 ENCOUNTER — Ambulatory Visit (HOSPITAL_COMMUNITY): Payer: Medicare Other | Attending: Cardiology

## 2012-03-31 DIAGNOSIS — I379 Nonrheumatic pulmonary valve disorder, unspecified: Secondary | ICD-10-CM | POA: Diagnosis not present

## 2012-03-31 DIAGNOSIS — I369 Nonrheumatic tricuspid valve disorder, unspecified: Secondary | ICD-10-CM | POA: Diagnosis not present

## 2012-03-31 DIAGNOSIS — I10 Essential (primary) hypertension: Secondary | ICD-10-CM | POA: Diagnosis not present

## 2012-03-31 DIAGNOSIS — E785 Hyperlipidemia, unspecified: Secondary | ICD-10-CM | POA: Diagnosis not present

## 2012-03-31 DIAGNOSIS — R42 Dizziness and giddiness: Secondary | ICD-10-CM | POA: Insufficient documentation

## 2012-03-31 NOTE — Progress Notes (Signed)
Echocardiogram performed.  

## 2012-04-01 ENCOUNTER — Encounter (HOSPITAL_COMMUNITY): Payer: Self-pay | Admitting: Diagnostic Neuroimaging

## 2012-04-20 DIAGNOSIS — R05 Cough: Secondary | ICD-10-CM | POA: Diagnosis not present

## 2012-04-20 DIAGNOSIS — R059 Cough, unspecified: Secondary | ICD-10-CM | POA: Diagnosis not present

## 2012-05-13 DIAGNOSIS — Z79899 Other long term (current) drug therapy: Secondary | ICD-10-CM | POA: Diagnosis not present

## 2012-05-13 DIAGNOSIS — R109 Unspecified abdominal pain: Secondary | ICD-10-CM | POA: Diagnosis not present

## 2012-06-15 IMAGING — CT CT ABD-PELV W/ CM
4 of 5 series · 13 of 36 positions shown, 19 images · IV contrast (READICAT/WATER & [ID] OMNI 300)
Comparison: 05/24/2008

CLINICAL DATA: Worsening right lower quadrant pain.  Nausea with
vomiting.  Constipation.  Previous appendectomy, cholecystectomy,
hysterectomy, colostomy reversal, and hernia repairs.

CT ABDOMEN AND PELVIS WITH CONTRAST
TECHNIQUE: Multidetector CT imaging of the abdomen and pelvis was
performed following the standard protocol during bolus
administration of intravenous contrast.
Contrast: 125 ml Emnipaque-LLL and oral contrast

[Series 3: routine abdomen · axial · 0.78mm/px · z∈[-363,-8]mm · 7 of 95 slices shown, 12 images]
[im 12/95  soft-tissue]
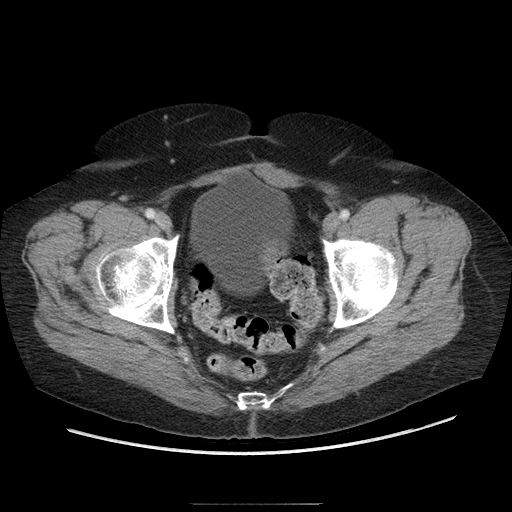
[im 12/95  bone]
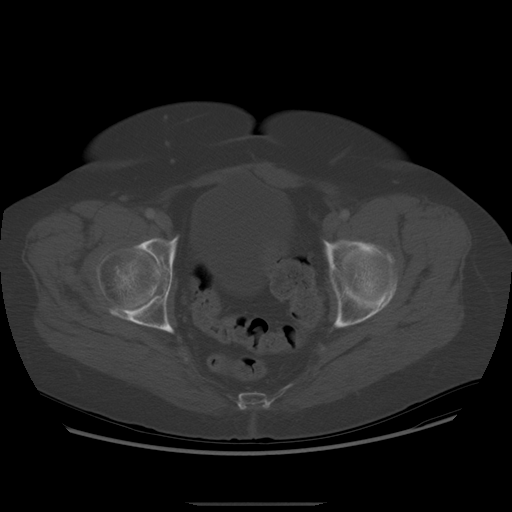
[im 24/95  soft-tissue]
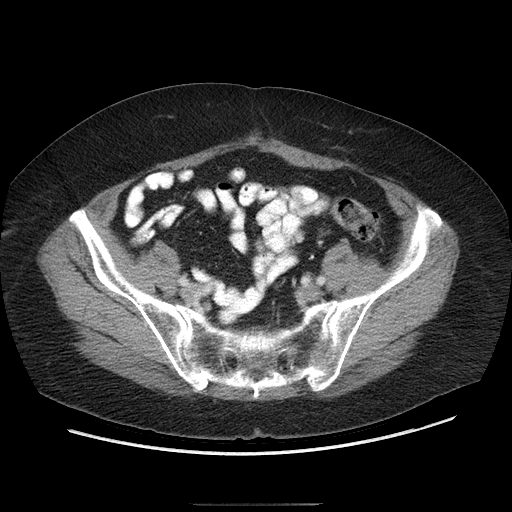
[im 36/95  soft-tissue]
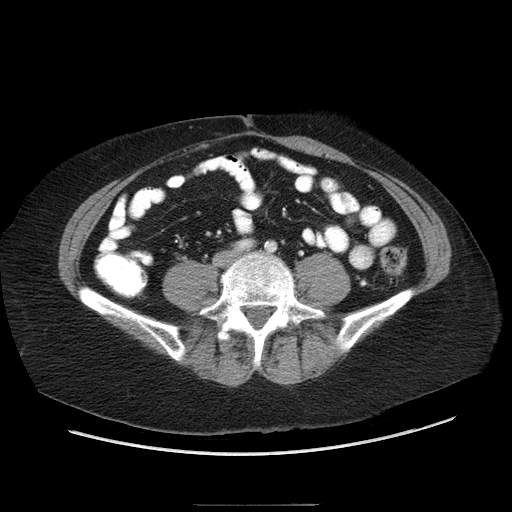
[im 48/95  soft-tissue]
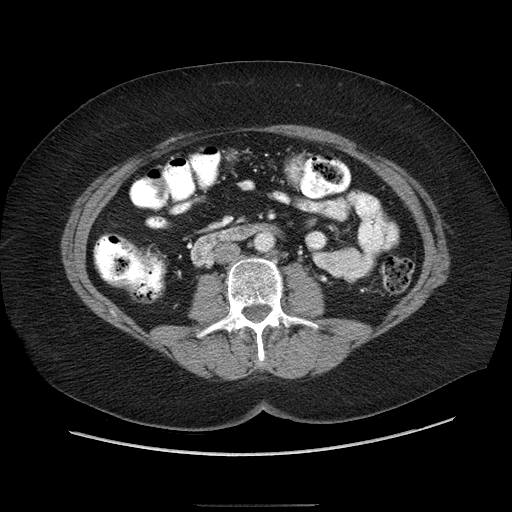
[im 48/95  lung]
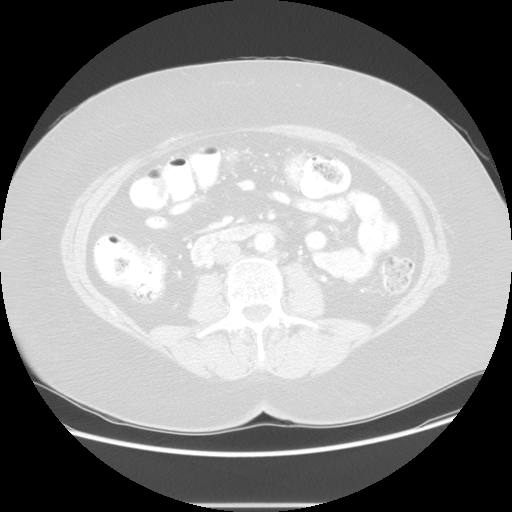
[im 59/95  soft-tissue]
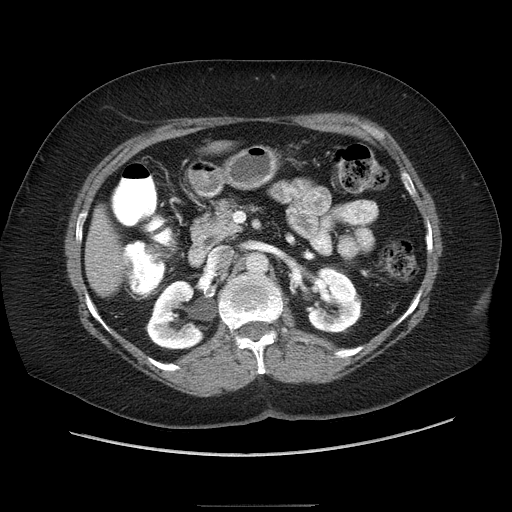
[im 59/95  lung]
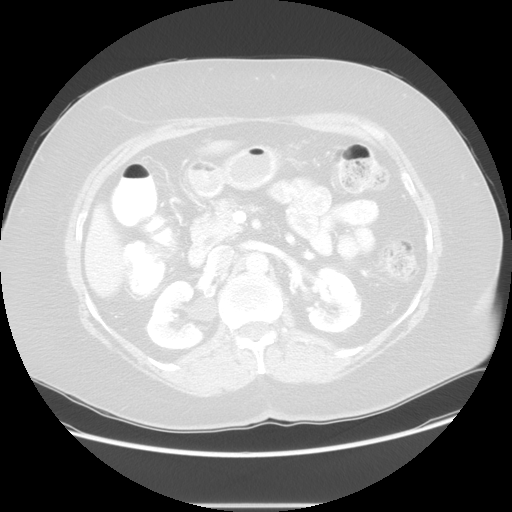
[im 71/95  soft-tissue]
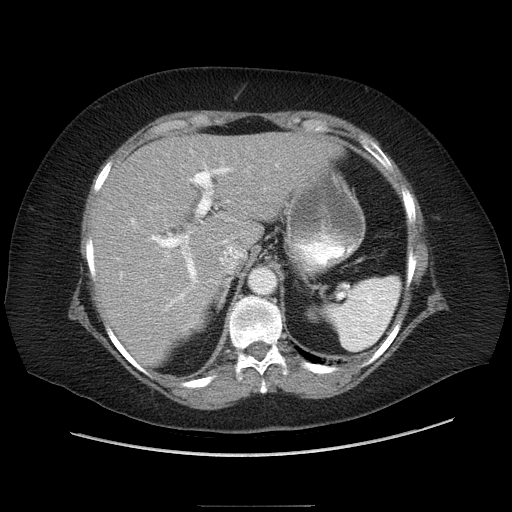
[im 71/95  lung]
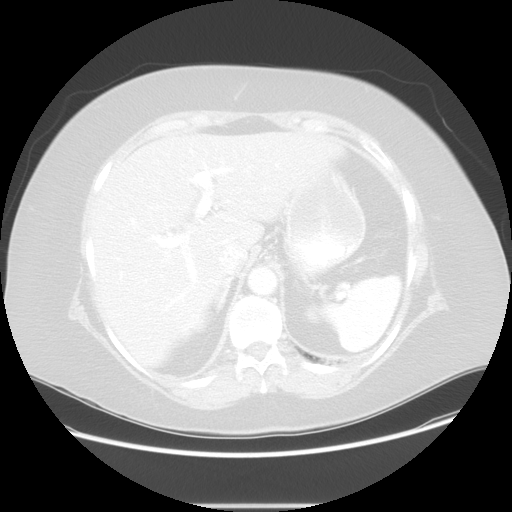
[im 83/95  soft-tissue]
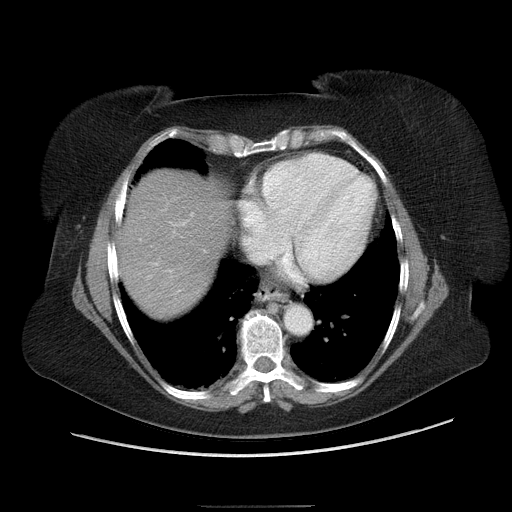
[im 83/95  lung]
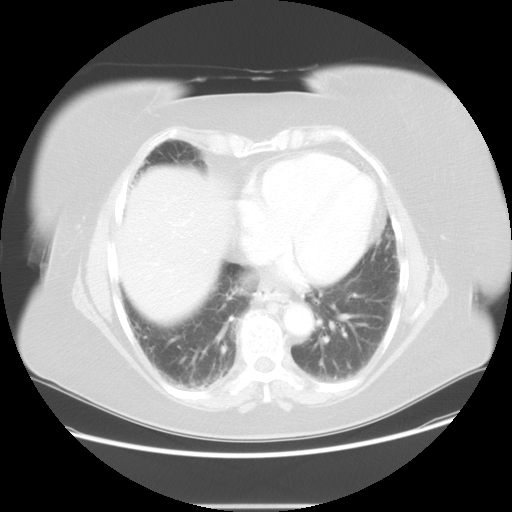

[Series 5: renal delay · axial · delayed · 0.78mm/px · z∈[-156,-96]mm · 2 of 38 slices shown]
[im 13/38  soft-tissue]
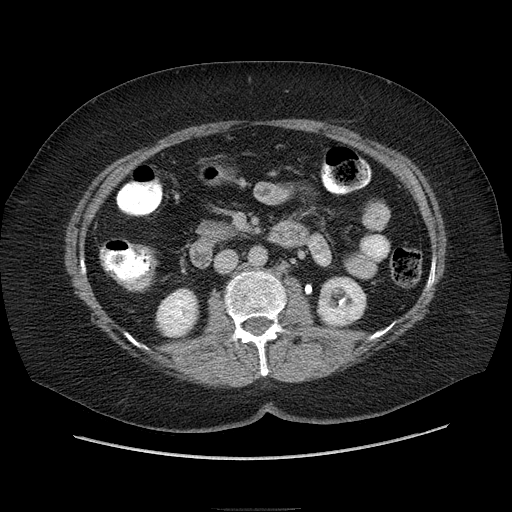
[im 25/38  soft-tissue]
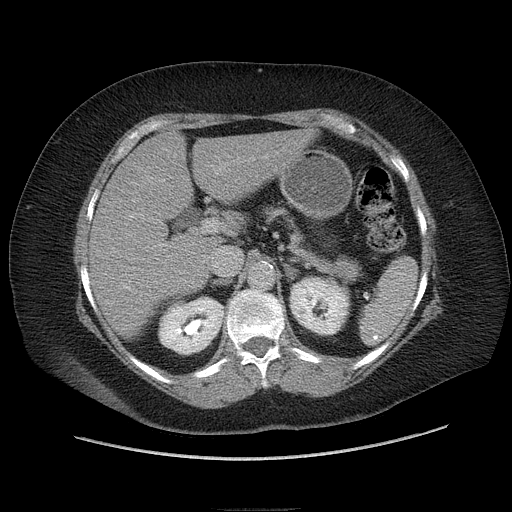

[Series 601: coronal body · coronal · 0.98mm/px · 1 of 122 slices shown, 2 images]
[im 41/122  soft-tissue]
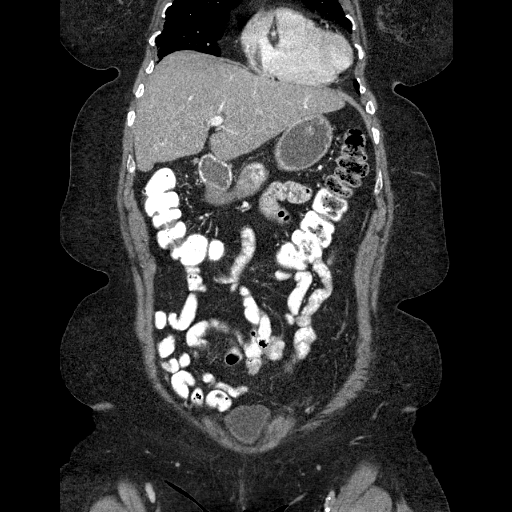
[im 41/122  bone]
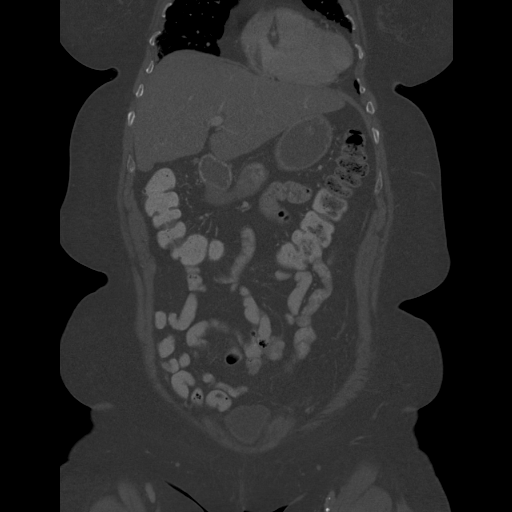

[Series 602: sagittal body · sagittal · 0.98mm/px · 3 of 161 slices shown]
[im 11/161  soft-tissue]
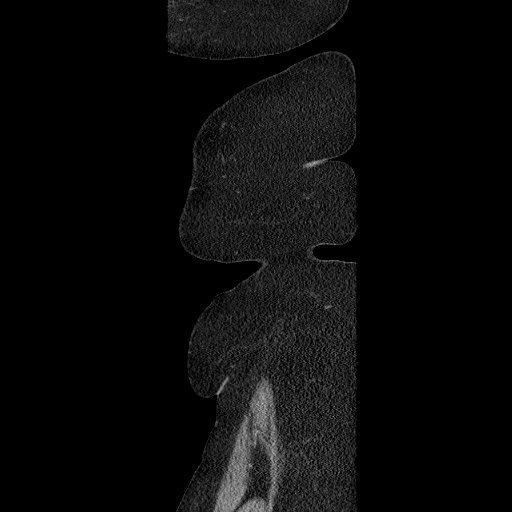
[im 33/161  soft-tissue]
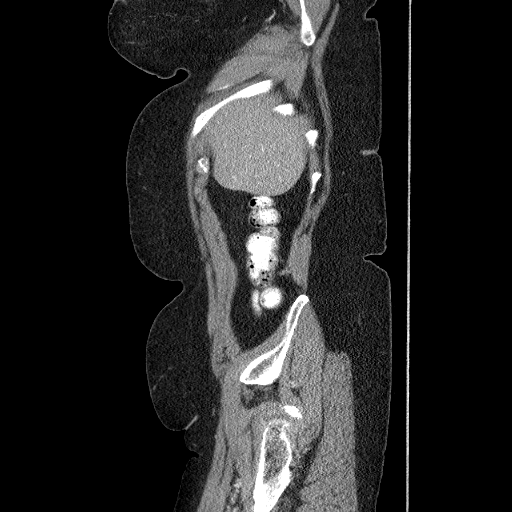
[im 54/161  soft-tissue]
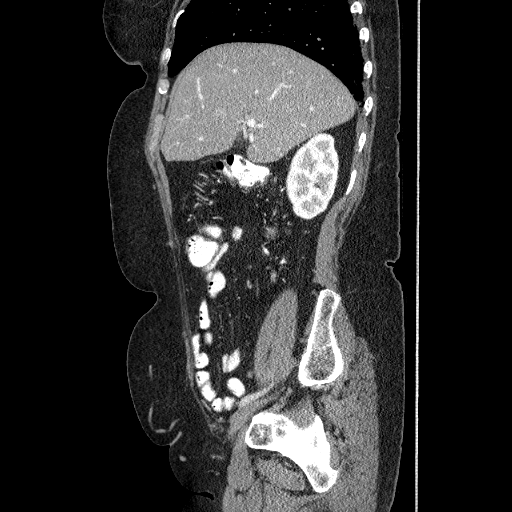

[13 of 36 positions shown; findings below may reference images not displayed]

FINDINGS: Hepatic steatosis again demonstrated.  No liver masses
are identified.  Prominence of the extrahepatic bile ducts is
stable status post cholecystectomy.  The pancreas, spleen, and
adrenal glands are normal in appearance.  Tiny left renal cysts
remain stable and there is no evidence of renal mass or
hydronephrosis.

No soft tissue masses or lymphadenopathy identified within the
abdomen or pelvis.  Uterus is surgically absent.  No evidence of
inflammatory process or abnormal fluid collections.  No evidence of
bowel wall thickening or dilatation.

A small right periumbilical anterior abdominal wall hernia is again
seen containing only fat.  No evidence of herniated bowel.
Previously seen fluid collection in the left lower quadrant
abdominal wall soft tissues has resolved since prior study.
IMPRESSION: 1.  No acute findings in the abdomen or pelvis.
2.  Stable hepatic steatosis.
3.  Stable small right periumbilical hernia containing only fat.

## 2012-07-07 ENCOUNTER — Other Ambulatory Visit: Payer: Self-pay | Admitting: Internal Medicine

## 2012-07-07 DIAGNOSIS — K219 Gastro-esophageal reflux disease without esophagitis: Secondary | ICD-10-CM

## 2012-07-07 DIAGNOSIS — R1013 Epigastric pain: Secondary | ICD-10-CM

## 2012-07-08 ENCOUNTER — Other Ambulatory Visit: Payer: Self-pay | Admitting: Internal Medicine

## 2012-07-08 ENCOUNTER — Ambulatory Visit
Admission: RE | Admit: 2012-07-08 | Discharge: 2012-07-08 | Disposition: A | Discharge status: Home / Self Care | Payer: Medicare Other | Source: Ambulatory Visit | Attending: Internal Medicine | Admitting: Internal Medicine

## 2012-07-08 DIAGNOSIS — K228 Other specified diseases of esophagus: Secondary | ICD-10-CM | POA: Diagnosis not present

## 2012-07-08 DIAGNOSIS — R1013 Epigastric pain: Secondary | ICD-10-CM

## 2012-07-08 DIAGNOSIS — K219 Gastro-esophageal reflux disease without esophagitis: Secondary | ICD-10-CM

## 2012-07-08 DIAGNOSIS — K2289 Other specified disease of esophagus: Secondary | ICD-10-CM | POA: Diagnosis not present

## 2012-09-03 DIAGNOSIS — I10 Essential (primary) hypertension: Secondary | ICD-10-CM | POA: Diagnosis not present

## 2012-09-03 DIAGNOSIS — N183 Chronic kidney disease, stage 3 unspecified: Secondary | ICD-10-CM | POA: Diagnosis not present

## 2012-09-03 DIAGNOSIS — N3942 Incontinence without sensory awareness: Secondary | ICD-10-CM | POA: Diagnosis not present

## 2012-09-03 DIAGNOSIS — R109 Unspecified abdominal pain: Secondary | ICD-10-CM | POA: Diagnosis not present

## 2012-09-10 DIAGNOSIS — M25539 Pain in unspecified wrist: Secondary | ICD-10-CM

## 2012-09-10 DIAGNOSIS — M25569 Pain in unspecified knee: Secondary | ICD-10-CM | POA: Diagnosis not present

## 2012-09-10 DIAGNOSIS — G8929 Other chronic pain: Secondary | ICD-10-CM | POA: Insufficient documentation

## 2012-09-10 HISTORY — DX: Pain in unspecified wrist: M25.539

## 2012-09-17 DIAGNOSIS — M549 Dorsalgia, unspecified: Secondary | ICD-10-CM | POA: Diagnosis not present

## 2012-09-17 DIAGNOSIS — M25559 Pain in unspecified hip: Secondary | ICD-10-CM | POA: Diagnosis not present

## 2012-09-20 IMAGING — NM NM BONE 3 PHASE
1 series · 6 of 6 positions shown · non-contrast
Comparison: None.

CLINICAL DATA: Right knee pain.  Right total knee replacement 14
years ago.

NUCLEAR MEDICINE THREE PHASE BONE SCAN
TECHNIQUE: Radionuclide angiographic images, immediate static
blood pool images, and 3-hour delayed static images were obtained
after intravenous injection of radiopharmaceutical.
Radiopharmaceutical: 23.9 mCi technetium 99m MDP IV

[Series 1: fl flow and static · 4.75mm/px · 6 of 40 frames shown]
[frame 4/40  full-range]
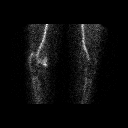
[frame 10/40  full-range]
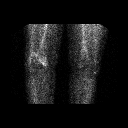
[frame 17/40  full-range]
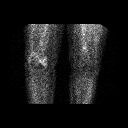
[frame 24/40  full-range]
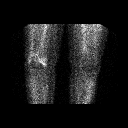
[frame 30/40  full-range]
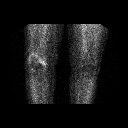
[frame 37/40  full-range]
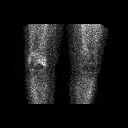

[6 of 6 positions shown; findings below may reference images not displayed]

FINDINGS: Intense activity is seen around the femoral component of
the right total knee prosthesis.  There is no abnormal activity
involving the patellar component or the tibial component.

There is no significant abnormal activity at the left knee.
IMPRESSION: Abnormal activity in the distal right femur at the site of the
femoral component of the total knee prosthesis.  Finding is
consistent with loosening of that component.

## 2012-09-30 DIAGNOSIS — M25559 Pain in unspecified hip: Secondary | ICD-10-CM | POA: Diagnosis not present

## 2012-09-30 DIAGNOSIS — M549 Dorsalgia, unspecified: Secondary | ICD-10-CM | POA: Diagnosis not present

## 2012-09-30 DIAGNOSIS — M1990 Unspecified osteoarthritis, unspecified site: Secondary | ICD-10-CM | POA: Diagnosis not present

## 2012-10-05 DIAGNOSIS — R1013 Epigastric pain: Secondary | ICD-10-CM | POA: Diagnosis not present

## 2012-10-05 DIAGNOSIS — R112 Nausea with vomiting, unspecified: Secondary | ICD-10-CM | POA: Diagnosis not present

## 2012-10-08 ENCOUNTER — Other Ambulatory Visit: Payer: Self-pay | Admitting: Gastroenterology

## 2012-10-08 DIAGNOSIS — R112 Nausea with vomiting, unspecified: Secondary | ICD-10-CM | POA: Diagnosis not present

## 2012-10-08 DIAGNOSIS — R1013 Epigastric pain: Secondary | ICD-10-CM | POA: Diagnosis not present

## 2012-10-08 DIAGNOSIS — K2970 Gastritis, unspecified, without bleeding: Secondary | ICD-10-CM | POA: Diagnosis not present

## 2012-10-08 DIAGNOSIS — K228 Other specified diseases of esophagus: Secondary | ICD-10-CM | POA: Diagnosis not present

## 2012-10-08 DIAGNOSIS — K2990 Gastroduodenitis, unspecified, without bleeding: Secondary | ICD-10-CM | POA: Diagnosis not present

## 2012-10-08 DIAGNOSIS — K2289 Other specified disease of esophagus: Secondary | ICD-10-CM | POA: Diagnosis not present

## 2012-10-08 IMAGING — CR DG CHEST 2V
2 series · 2 of 2 positions shown · non-contrast
Comparison: Chest x-ray of 04/05/2008 and CT chest of 12/07/2008

CLINICAL DATA: Preop for hip arthroplasty

CHEST - 2 VIEW

[view not recorded (1 of 2)]
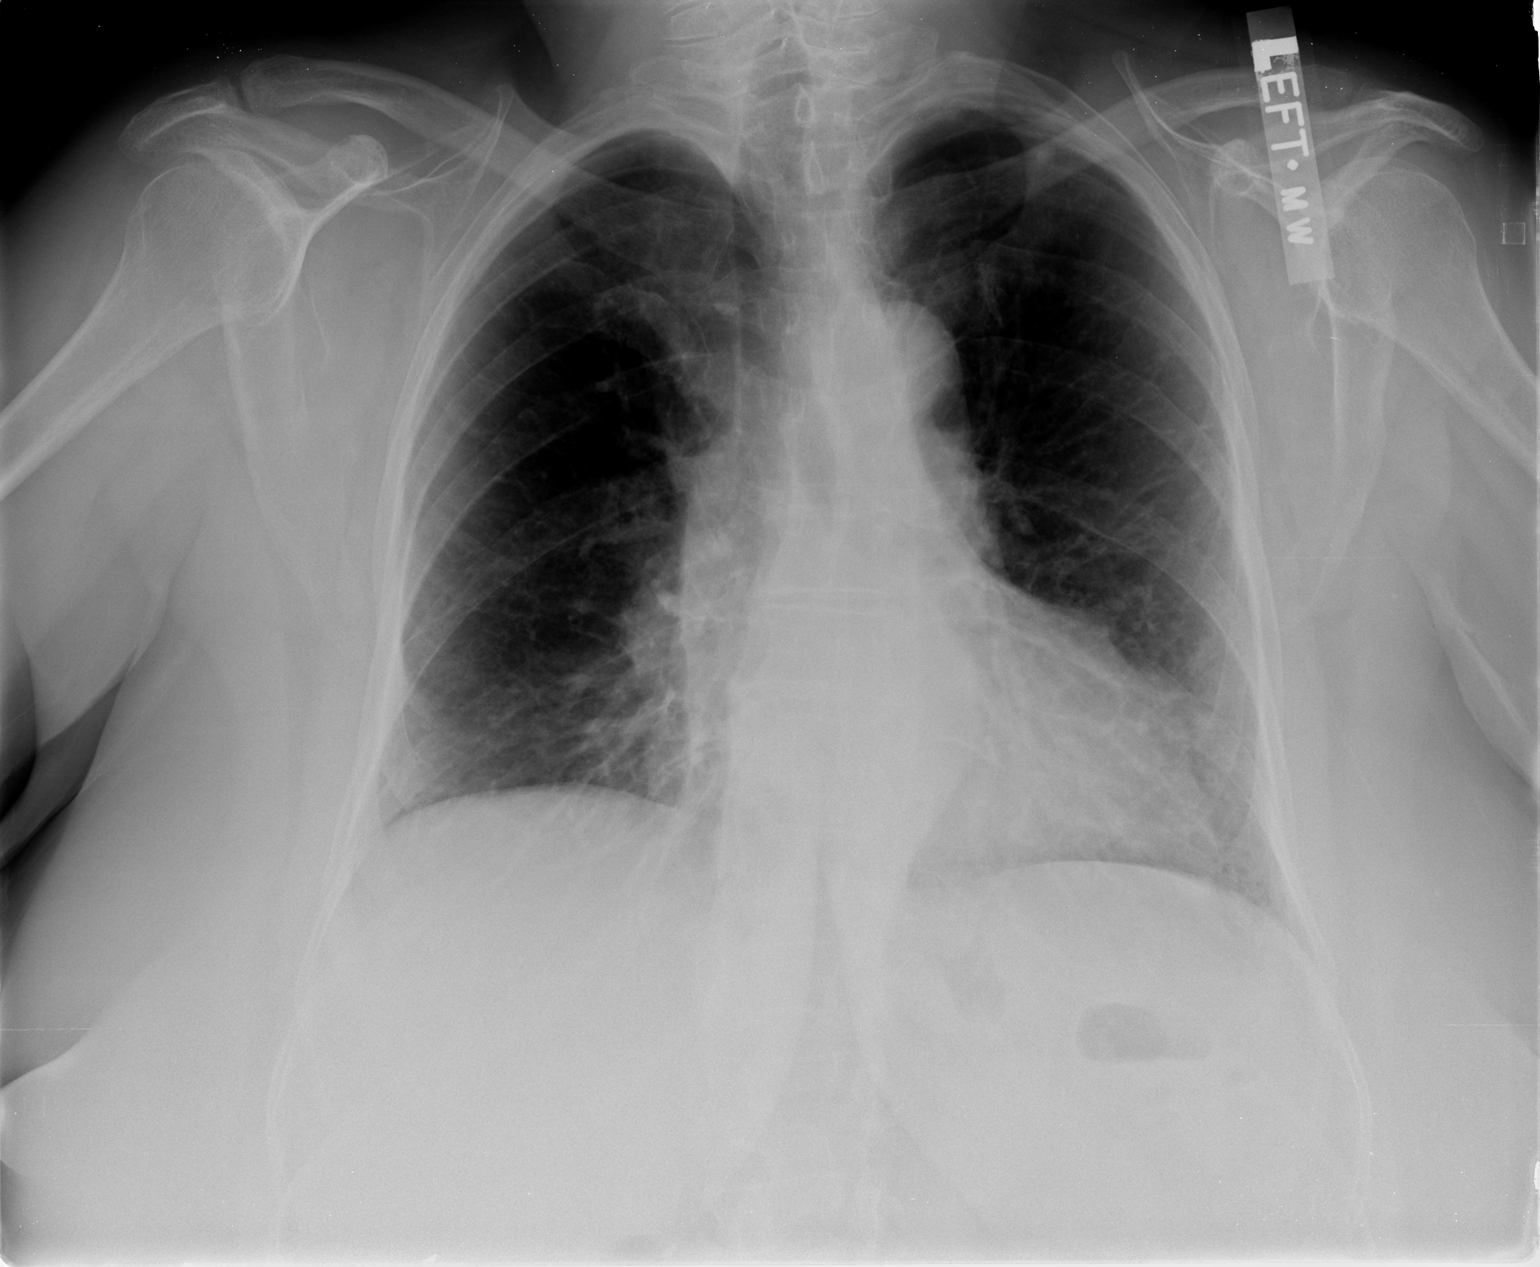

[view not recorded (2 of 2)]
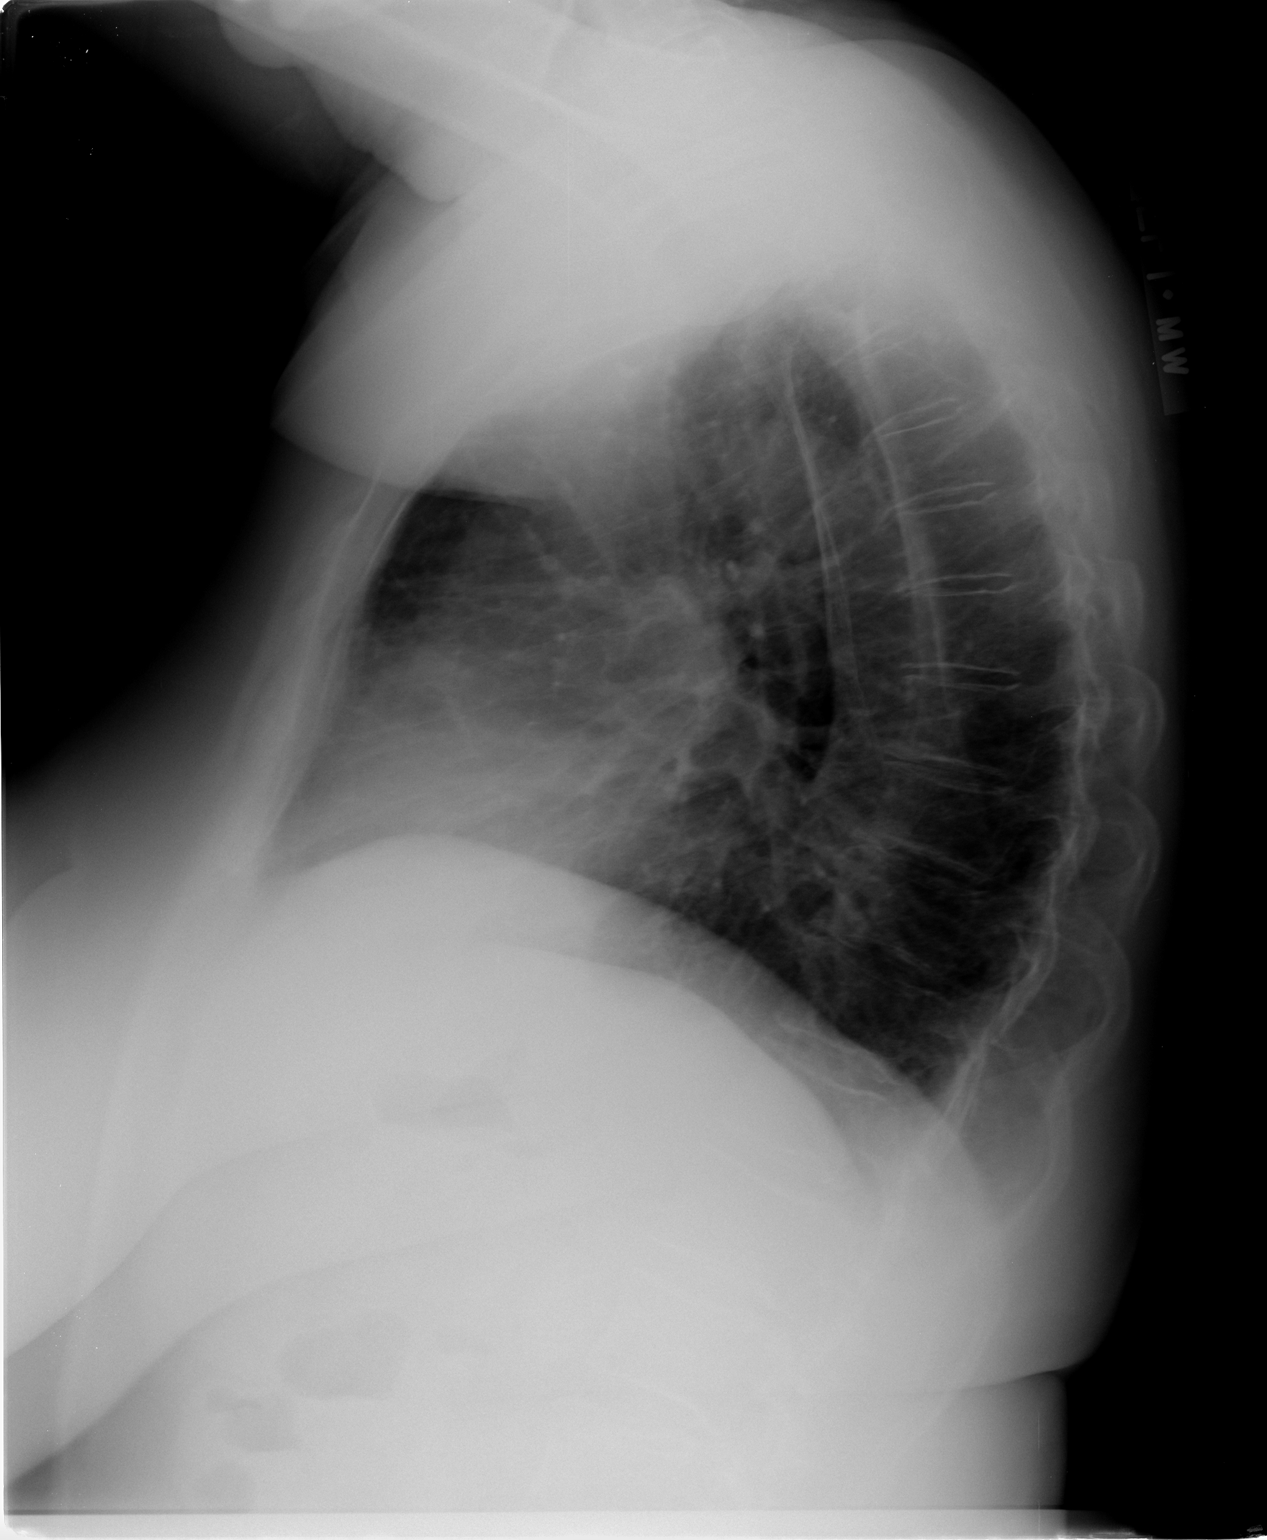

[2 of 2 positions shown; findings below may reference images not displayed]

FINDINGS: Mild basilar fibrosis is stable.  No active infiltrate or
effusion is seen.  The heart is mildly enlarged and stable.  No
acute bony abnormality is seen.  A small granuloma in the left lung
apex is stable.
IMPRESSION: Stable basilar fibrosis.  No active lung disease.

## 2012-10-12 DIAGNOSIS — M519 Unspecified thoracic, thoracolumbar and lumbosacral intervertebral disc disorder: Secondary | ICD-10-CM | POA: Diagnosis not present

## 2012-10-12 DIAGNOSIS — M25559 Pain in unspecified hip: Secondary | ICD-10-CM | POA: Diagnosis not present

## 2012-10-12 DIAGNOSIS — M549 Dorsalgia, unspecified: Secondary | ICD-10-CM | POA: Diagnosis not present

## 2012-10-16 ENCOUNTER — Encounter (HOSPITAL_COMMUNITY): Payer: Self-pay | Admitting: *Deleted

## 2012-10-16 DIAGNOSIS — R112 Nausea with vomiting, unspecified: Secondary | ICD-10-CM | POA: Insufficient documentation

## 2012-10-16 DIAGNOSIS — I129 Hypertensive chronic kidney disease with stage 1 through stage 4 chronic kidney disease, or unspecified chronic kidney disease: Secondary | ICD-10-CM | POA: Insufficient documentation

## 2012-10-16 DIAGNOSIS — Z9089 Acquired absence of other organs: Secondary | ICD-10-CM | POA: Insufficient documentation

## 2012-10-16 DIAGNOSIS — Z862 Personal history of diseases of the blood and blood-forming organs and certain disorders involving the immune mechanism: Secondary | ICD-10-CM | POA: Diagnosis not present

## 2012-10-16 DIAGNOSIS — Z9889 Other specified postprocedural states: Secondary | ICD-10-CM | POA: Diagnosis not present

## 2012-10-16 DIAGNOSIS — Z90710 Acquired absence of both cervix and uterus: Secondary | ICD-10-CM | POA: Insufficient documentation

## 2012-10-16 DIAGNOSIS — Z8719 Personal history of other diseases of the digestive system: Secondary | ICD-10-CM | POA: Diagnosis not present

## 2012-10-16 DIAGNOSIS — N189 Chronic kidney disease, unspecified: Secondary | ICD-10-CM | POA: Insufficient documentation

## 2012-10-16 DIAGNOSIS — D649 Anemia, unspecified: Secondary | ICD-10-CM | POA: Diagnosis not present

## 2012-10-16 DIAGNOSIS — Z7982 Long term (current) use of aspirin: Secondary | ICD-10-CM | POA: Diagnosis not present

## 2012-10-16 DIAGNOSIS — Z79899 Other long term (current) drug therapy: Secondary | ICD-10-CM | POA: Diagnosis not present

## 2012-10-16 DIAGNOSIS — F17200 Nicotine dependence, unspecified, uncomplicated: Secondary | ICD-10-CM | POA: Insufficient documentation

## 2012-10-16 DIAGNOSIS — K449 Diaphragmatic hernia without obstruction or gangrene: Secondary | ICD-10-CM | POA: Diagnosis not present

## 2012-10-16 DIAGNOSIS — K219 Gastro-esophageal reflux disease without esophagitis: Secondary | ICD-10-CM | POA: Insufficient documentation

## 2012-10-16 DIAGNOSIS — R7402 Elevation of levels of lactic acid dehydrogenase (LDH): Secondary | ICD-10-CM | POA: Diagnosis not present

## 2012-10-16 DIAGNOSIS — E669 Obesity, unspecified: Secondary | ICD-10-CM | POA: Insufficient documentation

## 2012-10-16 DIAGNOSIS — J449 Chronic obstructive pulmonary disease, unspecified: Secondary | ICD-10-CM | POA: Insufficient documentation

## 2012-10-16 DIAGNOSIS — R197 Diarrhea, unspecified: Secondary | ICD-10-CM | POA: Diagnosis not present

## 2012-10-16 DIAGNOSIS — K2970 Gastritis, unspecified, without bleeding: Secondary | ICD-10-CM | POA: Diagnosis not present

## 2012-10-16 DIAGNOSIS — N281 Cyst of kidney, acquired: Secondary | ICD-10-CM | POA: Diagnosis not present

## 2012-10-16 DIAGNOSIS — F329 Major depressive disorder, single episode, unspecified: Secondary | ICD-10-CM | POA: Diagnosis not present

## 2012-10-16 DIAGNOSIS — R7401 Elevation of levels of liver transaminase levels: Secondary | ICD-10-CM | POA: Insufficient documentation

## 2012-10-16 DIAGNOSIS — R1031 Right lower quadrant pain: Secondary | ICD-10-CM | POA: Diagnosis not present

## 2012-10-16 DIAGNOSIS — R1013 Epigastric pain: Secondary | ICD-10-CM | POA: Diagnosis not present

## 2012-10-16 DIAGNOSIS — Z8639 Personal history of other endocrine, nutritional and metabolic disease: Secondary | ICD-10-CM | POA: Insufficient documentation

## 2012-10-16 DIAGNOSIS — J4489 Other specified chronic obstructive pulmonary disease: Secondary | ICD-10-CM | POA: Insufficient documentation

## 2012-10-16 DIAGNOSIS — F3289 Other specified depressive episodes: Secondary | ICD-10-CM | POA: Insufficient documentation

## 2012-10-16 LAB — CBC WITH DIFFERENTIAL/PLATELET
Basophils Absolute: 0 10*3/uL (ref 0.0–0.1)
Basophils Relative: 1 % (ref 0–1)
Eosinophils Absolute: 0.2 10*3/uL (ref 0.0–0.7)
Eosinophils Relative: 4 % (ref 0–5)
HCT: 44.7 % (ref 36.0–46.0)
Hemoglobin: 14.8 g/dL (ref 12.0–15.0)
Lymphocytes Relative: 33 % (ref 12–46)
Lymphs Abs: 2.2 10*3/uL (ref 0.7–4.0)
MCH: 29.8 pg (ref 26.0–34.0)
MCHC: 33.1 g/dL (ref 30.0–36.0)
MCV: 90.1 fL (ref 78.0–100.0)
Monocytes Absolute: 0.6 10*3/uL (ref 0.1–1.0)
Monocytes Relative: 9 % (ref 3–12)
Neutro Abs: 3.6 10*3/uL (ref 1.7–7.7)
Neutrophils Relative %: 54 % (ref 43–77)
Platelets: 223 10*3/uL (ref 150–400)
RBC: 4.96 MIL/uL (ref 3.87–5.11)
RDW: 14.4 % (ref 11.5–15.5)
WBC: 6.7 10*3/uL (ref 4.0–10.5)

## 2012-10-16 LAB — COMPREHENSIVE METABOLIC PANEL
ALT: 68 U/L — ABNORMAL HIGH (ref 0–35)
AST: 158 U/L — ABNORMAL HIGH (ref 0–37)
Albumin: 3.9 g/dL (ref 3.5–5.2)
Alkaline Phosphatase: 99 U/L (ref 39–117)
BUN: 8 mg/dL (ref 6–23)
CO2: 29 mEq/L (ref 19–32)
Calcium: 10.2 mg/dL (ref 8.4–10.5)
Chloride: 103 mEq/L (ref 96–112)
Creatinine, Ser: 1.21 mg/dL — ABNORMAL HIGH (ref 0.50–1.10)
GFR calc Af Amer: 51 mL/min — ABNORMAL LOW (ref >90)
GFR calc non Af Amer: 44 mL/min — ABNORMAL LOW (ref >90)
Glucose, Bld: 105 mg/dL — ABNORMAL HIGH (ref 70–99)
Potassium: 3.7 mEq/L (ref 3.5–5.1)
Sodium: 143 mEq/L (ref 135–145)
Total Bilirubin: 0.5 mg/dL (ref 0.3–1.2)
Total Protein: 8.4 g/dL — ABNORMAL HIGH (ref 6.0–8.3)

## 2012-10-16 LAB — LIPASE, BLOOD: Lipase: 26 U/L (ref 11–59)

## 2012-10-16 LAB — POCT I-STAT TROPONIN I: Troponin i, poc: 0.01 ng/mL (ref 0.00–0.08)

## 2012-10-16 MED ORDER — ONDANSETRON 4 MG PO TBDP
ORAL_TABLET | ORAL | Status: AC
  Filled 2012-10-16: qty 2

## 2012-10-16 MED ORDER — ONDANSETRON 4 MG PO TBDP
8.0000 mg | ORAL_TABLET | Freq: Once | ORAL | Status: AC
  Administered 2012-10-16: 8 mg via ORAL

## 2012-10-16 NOTE — ED Notes (Signed)
C/o abd pain, nvd. Onset 0400 this am. Here by EMS. Took 4 baby ASA PTA, 2 vicodin at 1800. Also zofran not helping. Recent endoscopy study last week. Alert, NAD, calm, interactive, resps e/u, speaking in clear complete sentences. clear reddish liquid gastric content emesis. Last BM today. Mentions miralax, sennakot. Also pinpoints CP to epigastric area and r/t hiatal hernia, vomiting helps the pressure let up.

## 2012-10-16 NOTE — ED Notes (Signed)
Old and new EKG given to Dr. Bednar 

## 2012-10-17 ENCOUNTER — Emergency Department (HOSPITAL_COMMUNITY)
Admission: EM | Admit: 2012-10-17 | Discharge: 2012-10-17 | Disposition: A | Discharge status: Home / Self Care | Payer: Medicare Other | Attending: Emergency Medicine | Admitting: Emergency Medicine

## 2012-10-17 ENCOUNTER — Emergency Department (HOSPITAL_COMMUNITY): Payer: Medicare Other

## 2012-10-17 ENCOUNTER — Encounter (HOSPITAL_COMMUNITY): Payer: Self-pay | Admitting: Radiology

## 2012-10-17 DIAGNOSIS — K449 Diaphragmatic hernia without obstruction or gangrene: Secondary | ICD-10-CM | POA: Diagnosis not present

## 2012-10-17 DIAGNOSIS — N281 Cyst of kidney, acquired: Secondary | ICD-10-CM | POA: Diagnosis not present

## 2012-10-17 DIAGNOSIS — R112 Nausea with vomiting, unspecified: Secondary | ICD-10-CM

## 2012-10-17 DIAGNOSIS — R7401 Elevation of levels of liver transaminase levels: Secondary | ICD-10-CM

## 2012-10-17 DIAGNOSIS — R109 Unspecified abdominal pain: Secondary | ICD-10-CM

## 2012-10-17 HISTORY — DX: Diaphragmatic hernia without obstruction or gangrene: K44.9

## 2012-10-17 HISTORY — DX: Major depressive disorder, single episode, unspecified: F32.9

## 2012-10-17 HISTORY — DX: Hyperlipidemia, unspecified: E78.5

## 2012-10-17 HISTORY — DX: Gastro-esophageal reflux disease without esophagitis: K21.9

## 2012-10-17 HISTORY — DX: Anemia, unspecified: D64.9

## 2012-10-17 HISTORY — DX: Essential (primary) hypertension: I10

## 2012-10-17 HISTORY — DX: Depression, unspecified: F32.A

## 2012-10-17 HISTORY — DX: Chronic obstructive pulmonary disease, unspecified: J44.9

## 2012-10-17 MED ORDER — ONDANSETRON HCL 4 MG PO TABS: 4.0000 mg | ORAL_TABLET | Freq: Four times a day (QID) | ORAL | Status: DC

## 2012-10-17 MED ORDER — IOHEXOL 300 MG/ML  SOLN
100.0000 mL | Freq: Once | INTRAMUSCULAR | Status: AC | PRN
  Administered 2012-10-17: 100 mL via INTRAVENOUS

## 2012-10-17 MED ORDER — IOHEXOL 300 MG/ML  SOLN
50.0000 mL | Freq: Once | INTRAMUSCULAR | Status: AC | PRN
  Administered 2012-10-17: 50 mL via ORAL

## 2012-10-17 NOTE — ED Notes (Signed)
Patient transported to CT 

## 2012-10-17 NOTE — ED Notes (Signed)
Patient returned from CT

## 2012-10-17 NOTE — ED Notes (Signed)
MD at bedside. 

## 2012-10-17 NOTE — ED Provider Notes (Signed)
History     CSN: [id number]  Arrival date & time 10/16/12  2018   First MD Initiated Contact with Patient 10/17/12 0041      Chief Complaint  Patient presents with  . Emesis  . Abdominal Pain    (Consider location/radiation/quality/duration/timing/severity/associated sxs/prior treatment) HPI [name] is a 72 yo woman with multiple chronic medical problems who presents with complaints of abdominal pain. Patient developed severe epigastric pain in the midline region approx 12 hrs ago. Pain has been intermittent, waxing and waning, aching and cramping. 8/10 at worse. 2/10 at present. Pt notes nausea and 7-8 episodes of NBNB emesis.   Patient a history of several abdominal surgeries including cholecystectomy, rectocele repair and partial colectomy following accidental bowel injury complicating rectocele repair.   Patient notes that she has been experiencing similar but, less severe bouts of abdominal pain x past 3 to 4 weeks. But, was worse today. Patient had EGD with bx last week and was told yesterday that all results were wnl.  GI told patient and dtr that recurrent epigastric pain may be secondary to hiatal hernia.   No fever, No GU sx. Last BM was approx 20 hrs ago.    Past Medical History  Diagnosis Date  . Hiatal hernia   . Hypertension   . Anemia   . GERD (gastroesophageal reflux disease)   . Hyperlipidemia   . CKD (chronic kidney disease)   . COPD (chronic obstructive pulmonary disease)   . Depression     Past Surgical History  Procedure Laterality Date  . Abdominal surgery    . Colon surgery    . Joint replacement    . Cholecystectomy    . Abdominal hysterectomy    . Appendectomy    . Bladder surgery    . Rectocele repair      No family history on file.  History  Substance Use Topics  . Smoking status: Current Every Day Smoker  . Smokeless tobacco: Not on file  . Alcohol Use: No    OB History   Grav Para Term Preterm Abortions TAB SAB Ect Mult  Living                  Review of Systems Gen: no weight loss, fevers, chills, night sweats Eyes: no discharge or drainage, no occular pain or visual changes Nose: no epistaxis or rhinorrhea Mouth: no dental pain, no sore throat Neck: no neck pain Lungs: no SOB, cough, wheezing CV: no chest pain, palpitations, dependent edema or orthopnea Abd: As per history of present illness, otherwise negative GU: no dysuria or gross hematuria MSK: no myalgias or arthralgias Neuro: no headache, no focal neurologic deficits Skin: no rash Psyche: negative.  Allergies  Ampicillin-sulbactam sodium  Home Medications   Current Outpatient Rx  Name  Route  Sig  Dispense  Refill  . albuterol (ACCUNEB) 1.25 MG/3ML nebulizer solution   Nebulization   Take 1 ampule by nebulization every 6 (six) hours as needed for wheezing.         Marland Kitchen albuterol (PROVENTIL HFA;VENTOLIN HFA) 108 (90 BASE) MCG/ACT inhaler   Inhalation   Inhale 2 puffs into the lungs every 6 (six) hours as needed for wheezing.         Marland Kitchen ALPRAZolam (XANAX) 1 MG tablet   Oral   Take 1 mg by mouth at bedtime.         Marland Kitchen amitriptyline (ELAVIL) 25 MG tablet   Oral   Take 25 mg by  mouth at bedtime.         Marland Kitchen amLODipine-benazepril (LOTREL) 5-10 MG per capsule   Oral   Take 1 capsule by mouth daily.         Marland Kitchen aspirin 81 MG chewable tablet   Oral   Chew 324 mg by mouth once.         . citalopram (CELEXA) 40 MG tablet   Oral   Take 40 mg by mouth daily.         Marland Kitchen docusate sodium (COLACE) 100 MG capsule   Oral   Take 100 mg by mouth daily.         Marland Kitchen esomeprazole (NEXIUM) 40 MG capsule   Oral   Take 40 mg by mouth 2 (two) times daily.         Marland Kitchen HYDROcodone-acetaminophen (VICODIN) 5-500 MG per tablet   Oral   Take 1 tablet by mouth every 6 (six) hours as needed. For pain         . ibuprofen (ADVIL,MOTRIN) 200 MG tablet   Oral   Take 600 mg by mouth daily as needed for pain.         Marland Kitchen lovastatin  (MEVACOR) 10 MG tablet   Oral   Take 10 mg by mouth daily with lunch.         . meclizine (ANTIVERT) 12.5 MG tablet   Oral   Take 12.5 mg by mouth 3 (three) times daily as needed for nausea.         . polyethylene glycol powder (GLYCOLAX/MIRALAX) powder   Oral   Take 17 g by mouth daily. For constipation         . Probiotic Product (MISC INTESTINAL FLORA REGULAT) CHEW   Oral   Chew 1 each by mouth daily.         Marland Kitchen senna (SENOKOT) 8.6 MG tablet   Oral   Take 1 tablet by mouth daily.         . Soft Lens Products (RA REWETTING DROPS) SOLN   Does not apply   1 application by Does not apply route daily.         Marland Kitchen tiZANidine (ZANAFLEX) 2 MG tablet   Oral   Take 2 mg by mouth every 6 (six) hours as needed (for muscle relaxation).         . vitamin B-12 (CYANOCOBALAMIN) 1000 MCG tablet   Oral   Take 1,000 mcg by mouth daily.           BP 134/63  Pulse 59  Temp(Src) 97.8 F (36.6 C) (Oral)  Resp 16  SpO2 96%  Physical Exam Gen: well developed and well nourished appearing, sleeping but easily arousable, does not appear to be in distress.  Head: NCAT Eyes: PERL, EOMI Nose: no epistaixis or rhinorrhea Mouth/throat: mucosa is moist and pink Neck: supple, no stridor Lungs: CTA B, no wheezing, rhonchi or rales Abd: soft, obese, normal BS, no distension, ttp over the midline epigastrium and the RLQ Back: no ttp, no cva ttp, + kyphosis Skin: no rashese, wnl Neuro: CN ii-xii grossly intact, no focal deficits Psyche; normal affect,  calm and cooperative.   ED Course  Procedures (including critical care time) Results for orders placed during the hospital encounter of 10/17/12 (from the past 24 hour(s))  CBC WITH DIFFERENTIAL     Status: None   Collection Time    10/16/12  8:33 PM      Result Value Range  WBC 6.7  4.0 - 10.5 K/uL   RBC 4.96  3.87 - 5.11 MIL/uL   Hemoglobin 14.8  12.0 - 15.0 g/dL   HCT 16.1  09.6 - 04.5 %   MCV 90.1  78.0 - 100.0 fL   MCH  29.8  26.0 - 34.0 pg   MCHC 33.1  30.0 - 36.0 g/dL   RDW 40.9  81.1 - 91.4 %   Platelets 223  150 - 400 K/uL   Neutrophils Relative % 54  43 - 77 %   Neutro Abs 3.6  1.7 - 7.7 K/uL   Lymphocytes Relative 33  12 - 46 %   Lymphs Abs 2.2  0.7 - 4.0 K/uL   Monocytes Relative 9  3 - 12 %   Monocytes Absolute 0.6  0.1 - 1.0 K/uL   Eosinophils Relative 4  0 - 5 %   Eosinophils Absolute 0.2  0.0 - 0.7 K/uL   Basophils Relative 1  0 - 1 %   Basophils Absolute 0.0  0.0 - 0.1 K/uL  COMPREHENSIVE METABOLIC PANEL     Status: Abnormal   Collection Time    10/16/12  8:33 PM      Result Value Range   Sodium 143  135 - 145 mEq/L   Potassium 3.7  3.5 - 5.1 mEq/L   Chloride 103  96 - 112 mEq/L   CO2 29  19 - 32 mEq/L   Glucose, Bld 105 (*) 70 - 99 mg/dL   BUN 8  6 - 23 mg/dL   Creatinine, Ser 7.82 (*) 0.50 - 1.10 mg/dL   Calcium 95.6  8.4 - 21.3 mg/dL   Total Protein 8.4 (*) 6.0 - 8.3 g/dL   Albumin 3.9  3.5 - 5.2 g/dL   AST 086 (*) 0 - 37 U/L   ALT 68 (*) 0 - 35 U/L   Alkaline Phosphatase 99  39 - 117 U/L   Total Bilirubin 0.5  0.3 - 1.2 mg/dL   GFR calc non Af Amer 44 (*) >90 mL/min   GFR calc Af Amer 51 (*) >90 mL/min  LIPASE, BLOOD     Status: None   Collection Time    10/16/12  8:33 PM      Result Value Range   Lipase 26  11 - 59 U/L  POCT I-STAT TROPONIN I     Status: None   Collection Time    10/16/12  9:06 PM      Result Value Range   Troponin i, poc 0.01  0.00 - 0.08 ng/mL   Comment 3             EKG: nsr, no acute ischemic changes, normal intervals, normal axis, normal qrs complex  MDM  DDX: gastritis, PUD, GERD, pancreatitis, biliary obstruction SBO, colitis, UTI, enteritis.  We are managing symptomatically. PUD and gastritis unlikely given normal EGD last week. Normal lipase helps exclude acute pancreatitis. Pt is s/p cholecystectomy.   Concern is for SBO, especially in light of N/V and history of multiple previous abdominal surgeries. CT abd/pelvis is pending.        CT abd/pelvis notable for ventral hernia containing fat. This has been noted on previous exam. The patient is feeling much better. She says she is pain free and and asking for something to drink. She says she is been here all night and wants to go home. Repeat abdominal exam is benign. The patient is stable for d/c with plan to f/u with GI  and her PCP.   Brandt Loosen, MD 10/17/12 [phone]

## 2012-10-21 DIAGNOSIS — M545 Low back pain, unspecified: Secondary | ICD-10-CM | POA: Diagnosis not present

## 2012-10-21 DIAGNOSIS — M25559 Pain in unspecified hip: Secondary | ICD-10-CM | POA: Diagnosis not present

## 2012-11-02 ENCOUNTER — Other Ambulatory Visit: Payer: Self-pay | Admitting: Family Medicine

## 2012-11-02 DIAGNOSIS — M25551 Pain in right hip: Secondary | ICD-10-CM

## 2012-11-05 ENCOUNTER — Other Ambulatory Visit: Payer: Medicare Other

## 2012-11-13 ENCOUNTER — Ambulatory Visit
Admission: RE | Admit: 2012-11-13 | Discharge: 2012-11-13 | Disposition: A | Discharge status: Home / Self Care | Payer: Medicare Other | Source: Ambulatory Visit | Attending: Family Medicine | Admitting: Family Medicine

## 2012-11-13 DIAGNOSIS — M25551 Pain in right hip: Secondary | ICD-10-CM

## 2012-11-13 DIAGNOSIS — M169 Osteoarthritis of hip, unspecified: Secondary | ICD-10-CM | POA: Diagnosis not present

## 2012-11-19 DIAGNOSIS — R05 Cough: Secondary | ICD-10-CM | POA: Diagnosis not present

## 2012-11-19 DIAGNOSIS — R059 Cough, unspecified: Secondary | ICD-10-CM | POA: Diagnosis not present

## 2012-11-19 DIAGNOSIS — J449 Chronic obstructive pulmonary disease, unspecified: Secondary | ICD-10-CM | POA: Diagnosis not present

## 2012-12-08 DIAGNOSIS — M25559 Pain in unspecified hip: Secondary | ICD-10-CM | POA: Diagnosis not present

## 2012-12-08 DIAGNOSIS — M25579 Pain in unspecified ankle and joints of unspecified foot: Secondary | ICD-10-CM | POA: Diagnosis not present

## 2012-12-24 DIAGNOSIS — M19079 Primary osteoarthritis, unspecified ankle and foot: Secondary | ICD-10-CM | POA: Diagnosis not present

## 2013-02-09 DIAGNOSIS — M19079 Primary osteoarthritis, unspecified ankle and foot: Secondary | ICD-10-CM | POA: Diagnosis not present

## 2013-02-17 DIAGNOSIS — Z23 Encounter for immunization: Secondary | ICD-10-CM | POA: Diagnosis not present

## 2013-03-04 IMAGING — US US EXTREM LOW VENOUS*R*
1 series · 14 of 24 positions shown · non-contrast
Comparison: None.

CLINICAL DATA: Right lower extremity edema.

RIGHT LOWER EXTREMITY VENOUS DUPLEX ULTRASOUND
TECHNIQUE: Gray-scale sonography with graded compression, as well
as color Doppler and duplex ultrasound were performed to evaluate
the deep venous system of the lower extremity from the level of the
common femoral vein through the popliteal and proximal calf veins.
Spectral Doppler was utilized to evaluate flow at rest and with
distal augmentation maneuvers.

[Series 1: us extrem low venous*right* · 14 of 29 slices shown]
[im 1/29]
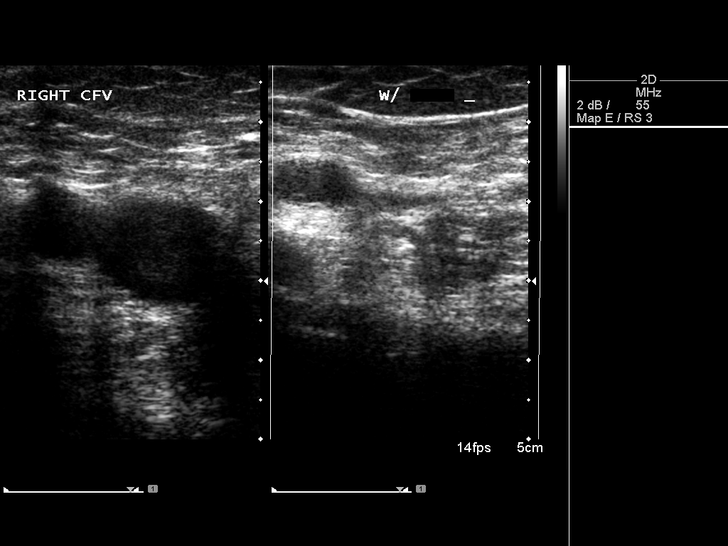
[im 3/29]
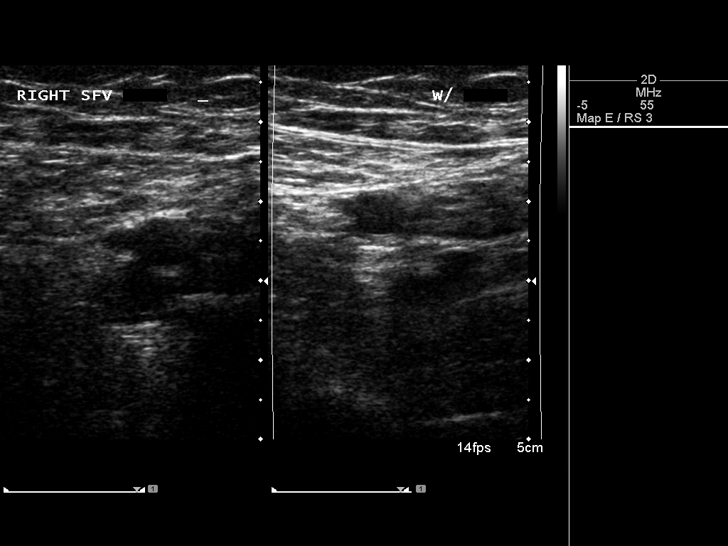
[im 5/29]
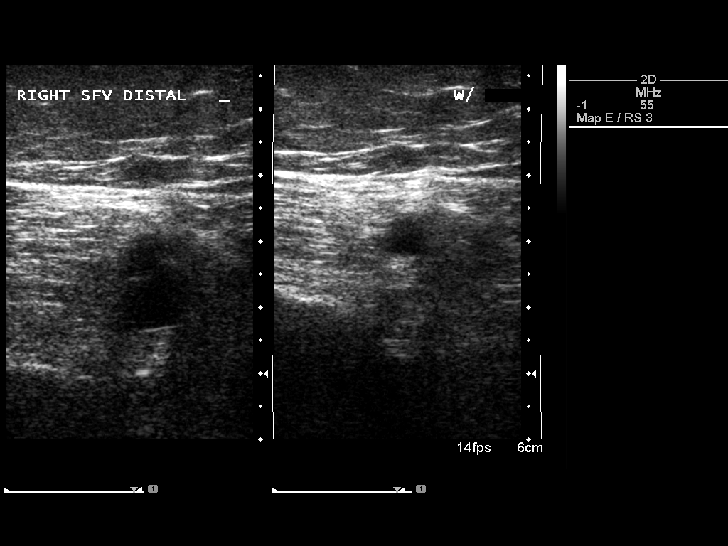
[im 8/29]
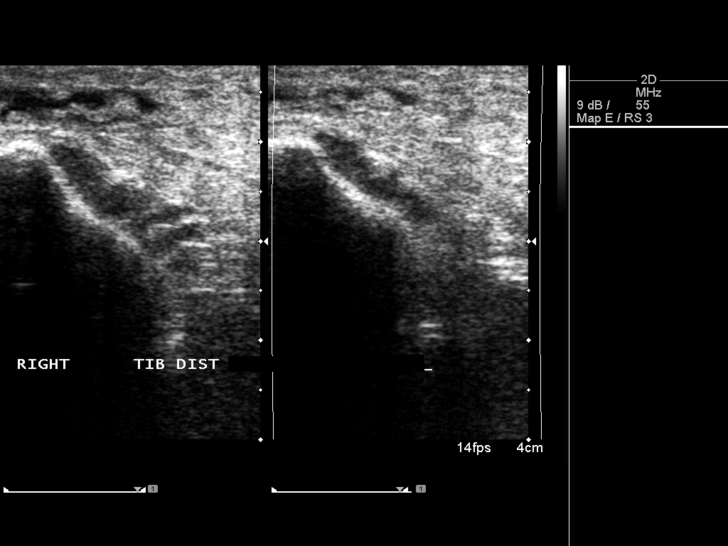
[im 9/29]
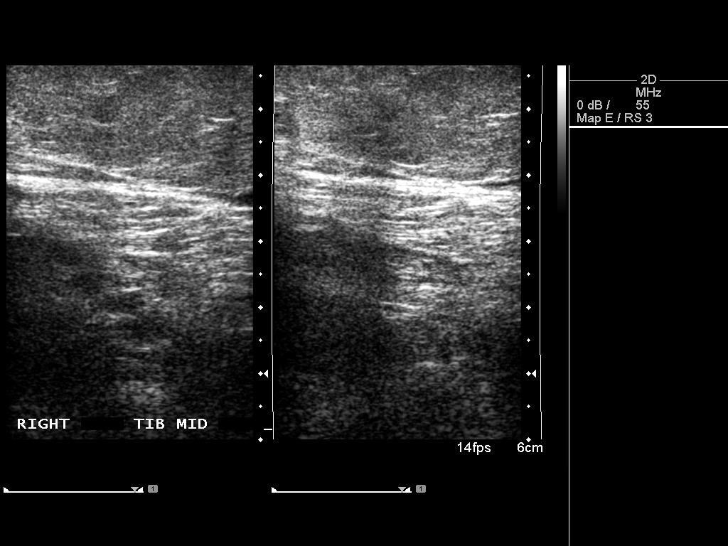
[im 11/29]
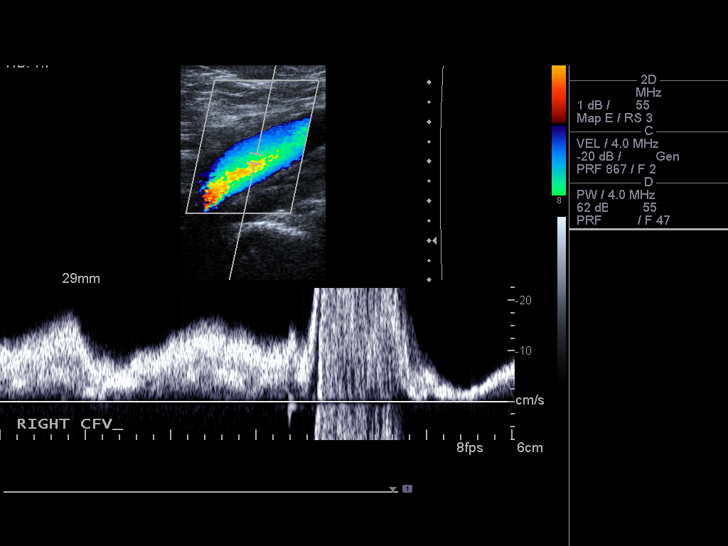
[im 14/29]
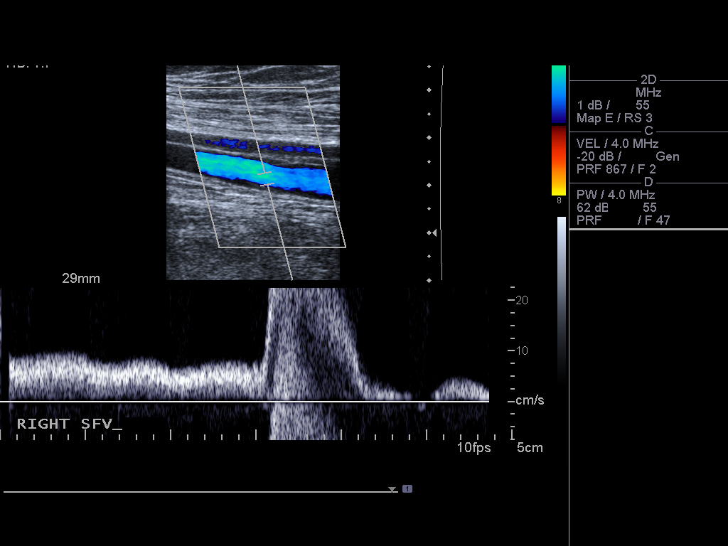
[im 15/29]
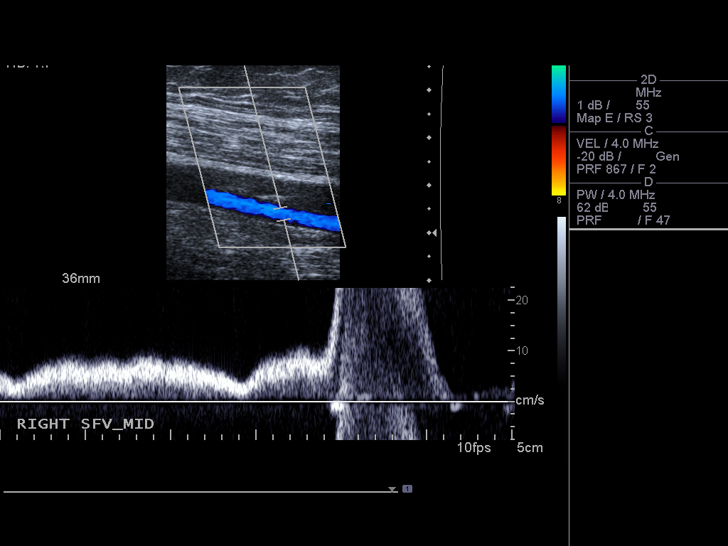
[im 18/29]
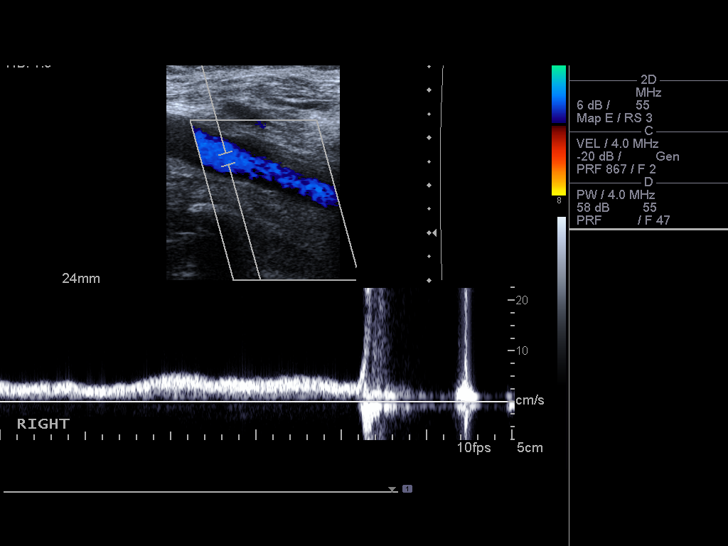
[im 20/29]
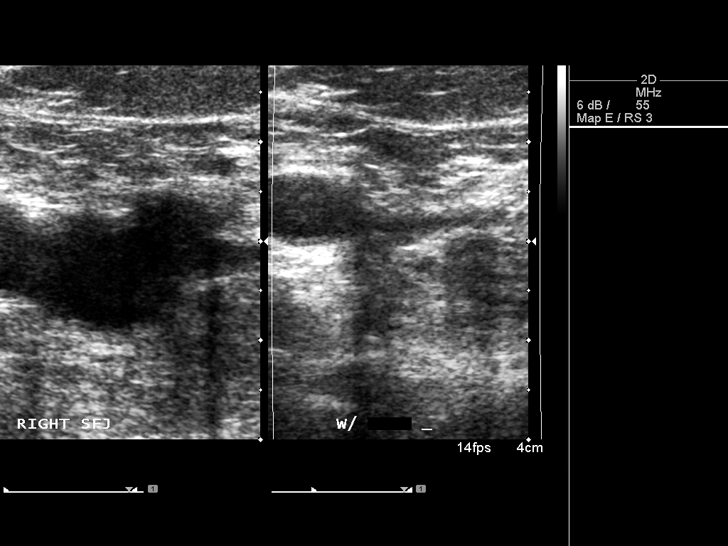
[im 22/29]
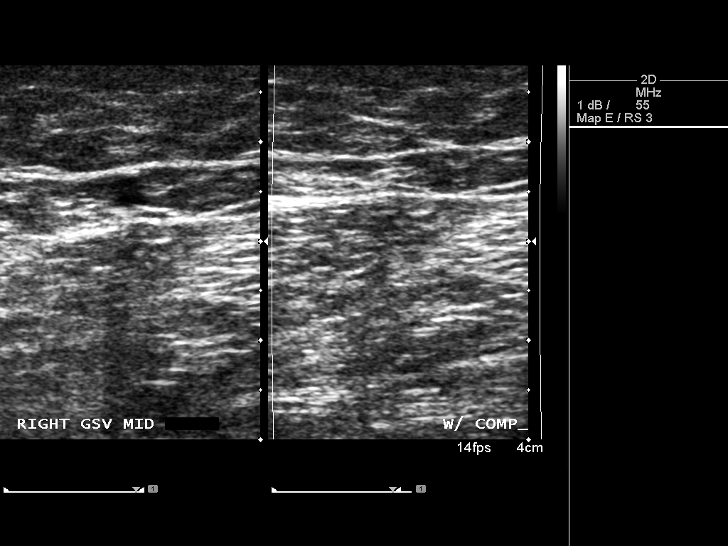
[im 24/29]
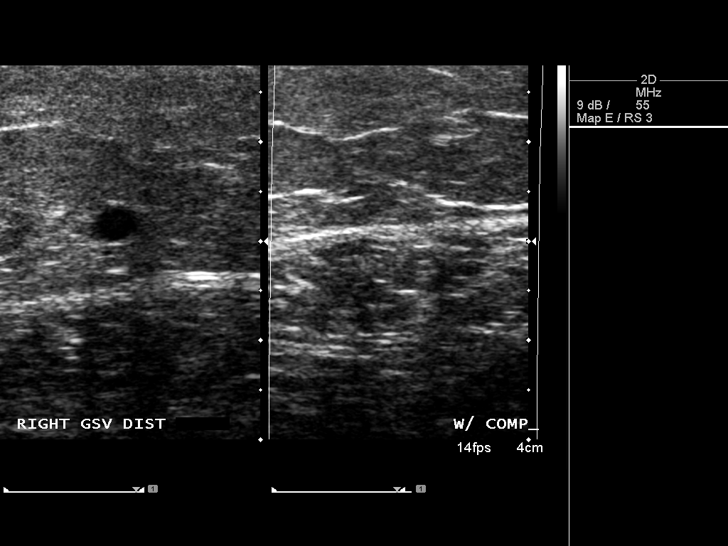
[im 26/29]
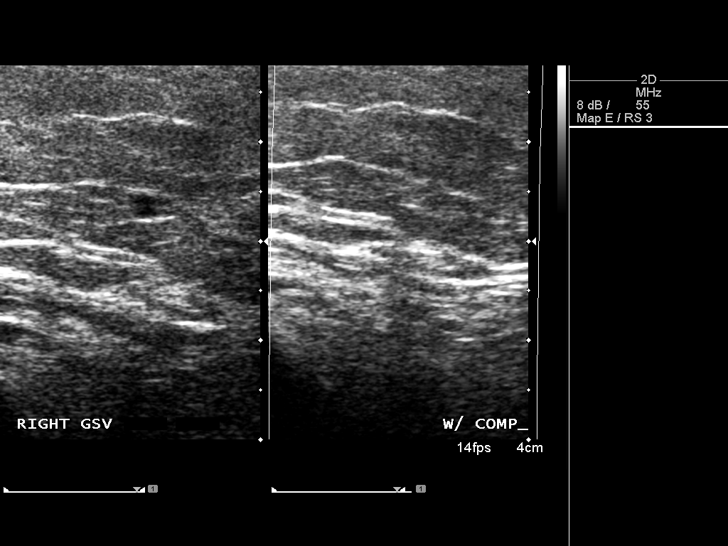
[im 29/29]
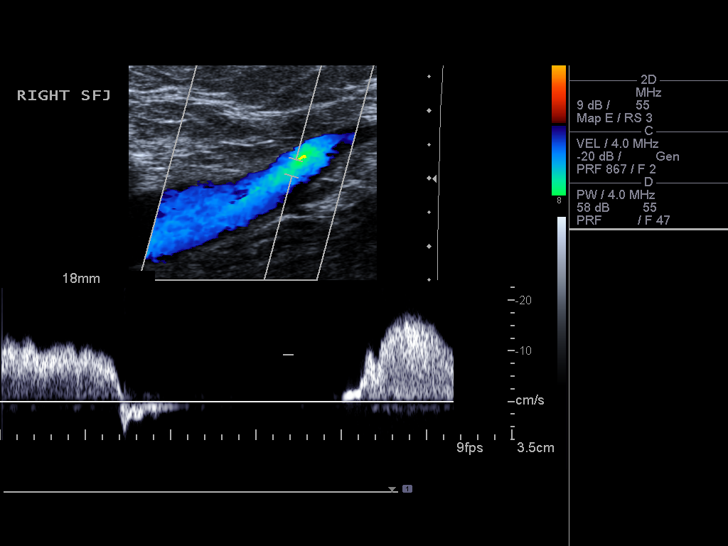

[14 of 24 positions shown; findings below may reference images not displayed]

FINDINGS: Normal compressibility of the common femoral,
superficial femoral, and popliteal veins is demonstrated, as well
as the visualized proximal calf veins.  No filling defects to
suggest DVT on grayscale or color Doppler imaging.  Doppler
waveforms show normal direction of venous flow, normal respiratory
phasicity and response to augmentation.
IMPRESSION: No evidence of lower extremity deep vein thrombosis.

## 2013-03-08 ENCOUNTER — Other Ambulatory Visit: Payer: Self-pay | Admitting: Internal Medicine

## 2013-03-08 DIAGNOSIS — Z1231 Encounter for screening mammogram for malignant neoplasm of breast: Secondary | ICD-10-CM

## 2013-03-16 DIAGNOSIS — N183 Chronic kidney disease, stage 3 unspecified: Secondary | ICD-10-CM | POA: Diagnosis not present

## 2013-03-16 DIAGNOSIS — E782 Mixed hyperlipidemia: Secondary | ICD-10-CM | POA: Diagnosis not present

## 2013-03-16 DIAGNOSIS — Z1331 Encounter for screening for depression: Secondary | ICD-10-CM | POA: Diagnosis not present

## 2013-03-16 DIAGNOSIS — K219 Gastro-esophageal reflux disease without esophagitis: Secondary | ICD-10-CM | POA: Diagnosis not present

## 2013-03-16 DIAGNOSIS — I10 Essential (primary) hypertension: Secondary | ICD-10-CM | POA: Diagnosis not present

## 2013-03-16 DIAGNOSIS — F329 Major depressive disorder, single episode, unspecified: Secondary | ICD-10-CM | POA: Diagnosis not present

## 2013-03-16 DIAGNOSIS — M1990 Unspecified osteoarthritis, unspecified site: Secondary | ICD-10-CM | POA: Diagnosis not present

## 2013-03-16 DIAGNOSIS — J449 Chronic obstructive pulmonary disease, unspecified: Secondary | ICD-10-CM | POA: Diagnosis not present

## 2013-03-22 DIAGNOSIS — M25579 Pain in unspecified ankle and joints of unspecified foot: Secondary | ICD-10-CM | POA: Diagnosis not present

## 2013-03-22 DIAGNOSIS — M169 Osteoarthritis of hip, unspecified: Secondary | ICD-10-CM | POA: Diagnosis not present

## 2013-03-23 DIAGNOSIS — R51 Headache: Secondary | ICD-10-CM | POA: Diagnosis not present

## 2013-03-23 DIAGNOSIS — H8110 Benign paroxysmal vertigo, unspecified ear: Secondary | ICD-10-CM | POA: Diagnosis not present

## 2013-03-25 ENCOUNTER — Encounter: Payer: Self-pay | Admitting: Physical Medicine and Rehabilitation

## 2013-03-30 ENCOUNTER — Telehealth (INDEPENDENT_AMBULATORY_CARE_PROVIDER_SITE_OTHER): Payer: Self-pay

## 2013-03-30 IMAGING — CT CT HEAD W/O CM
2 series · 16 of 30 positions shown, 18 images · non-contrast
Comparison: 06/18/2009

CLINICAL DATA: Fall, headache.

CT HEAD WITHOUT CONTRAST
TECHNIQUE: Contiguous axial images were obtained from the base of
the skull through the vertex without contrast.

[Series 2: head w/o · axial · non-contrast · 0.49mm/px · z∈[+53,+166]mm · 8 of 28 slices shown, 10 images]
[im 4/28  brain]
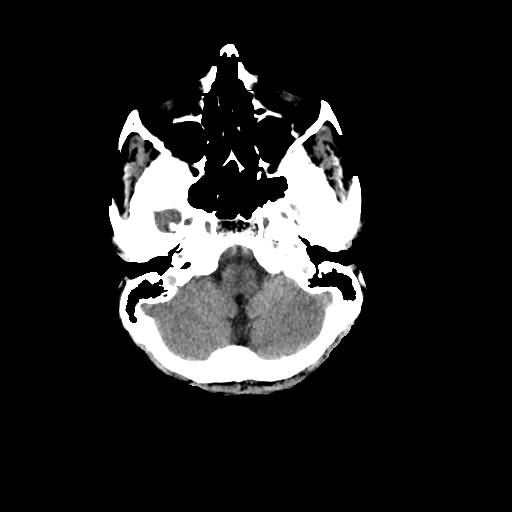
[im 4/28  bone]
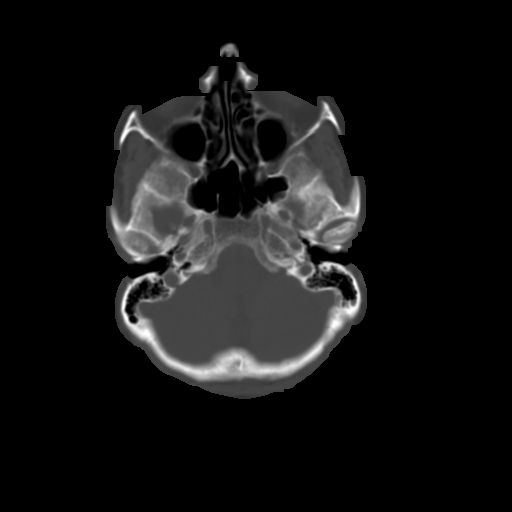
[im 7/28  brain]
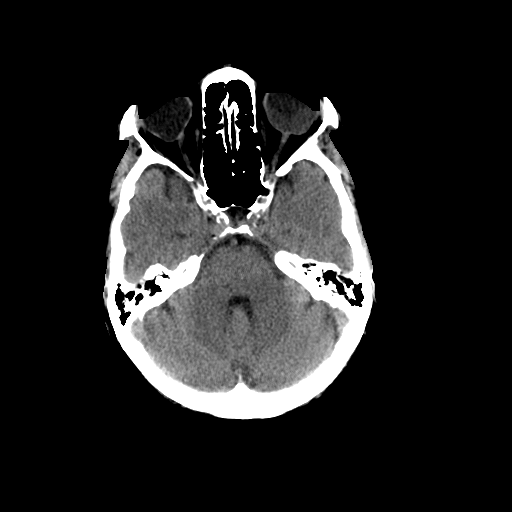
[im 10/28  brain]
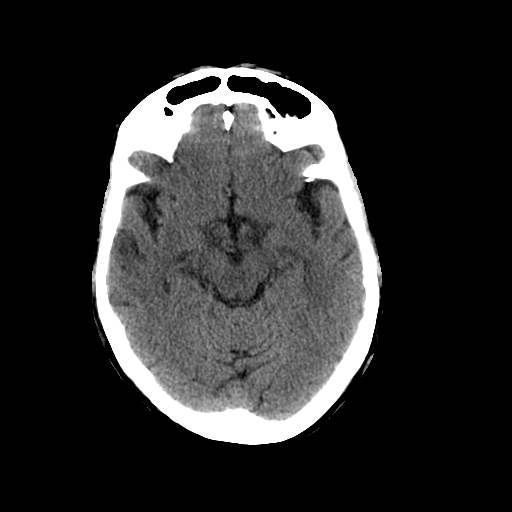
[im 13/28  brain]
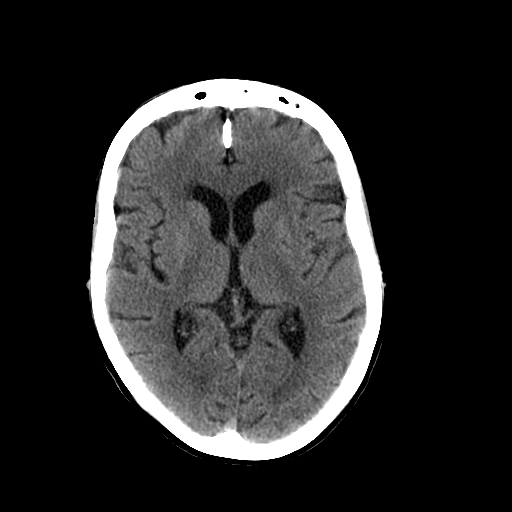
[im 16/28  brain]
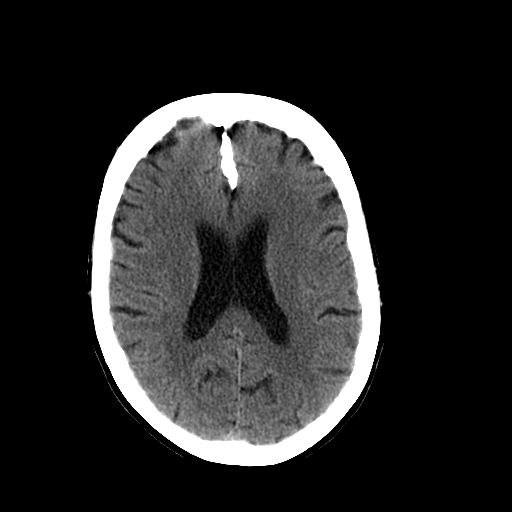
[im 16/28  bone]
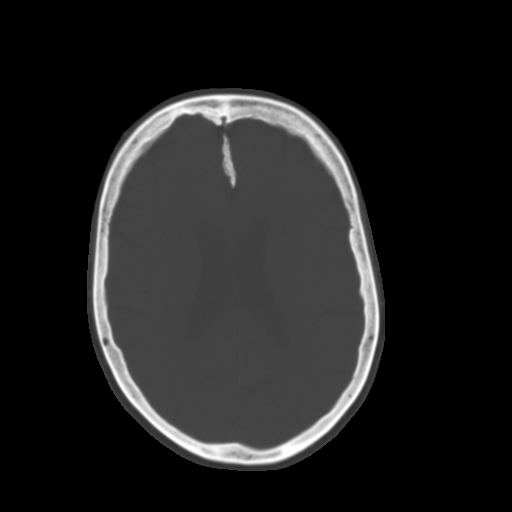
[im 19/28  brain]
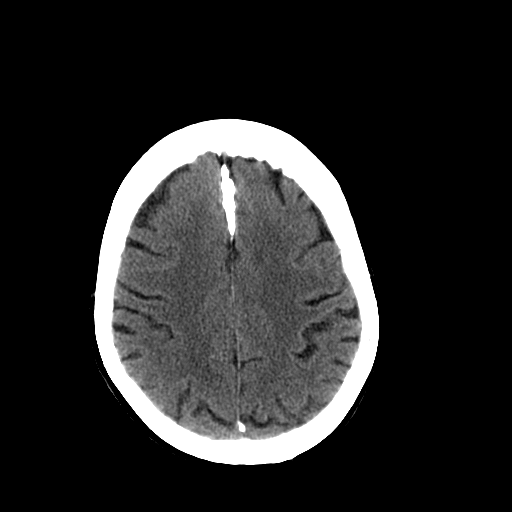
[im 22/28  brain]
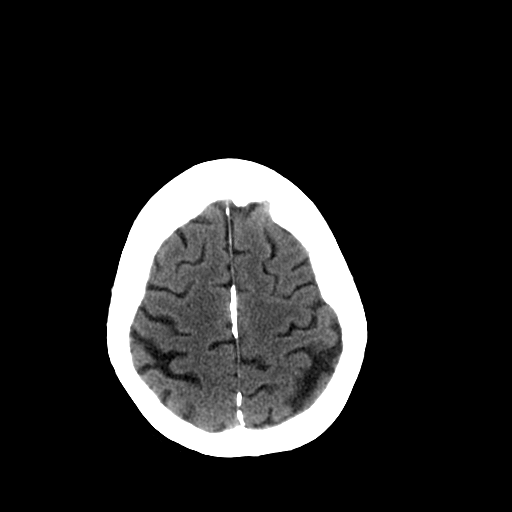
[im 25/28  brain]
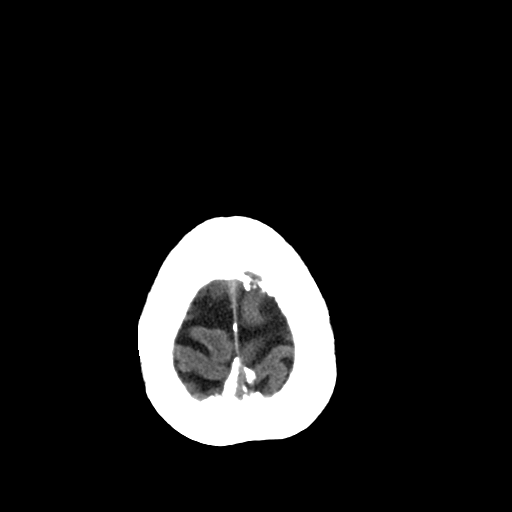

[Series 3: head bone · axial · 0.49mm/px · z∈[+48,+167]mm · 8 of 56 slices shown]
[im 6/56  bone]
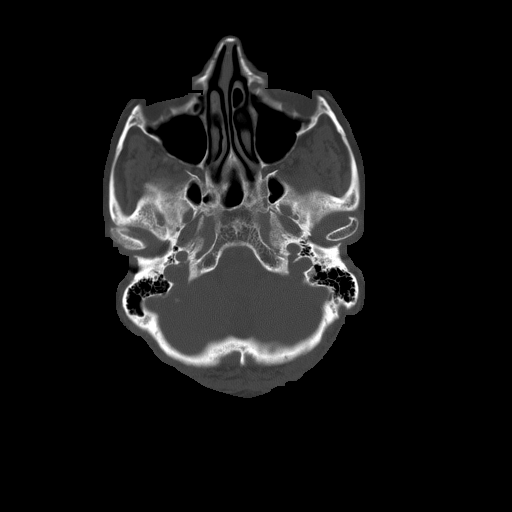
[im 12/56  bone]
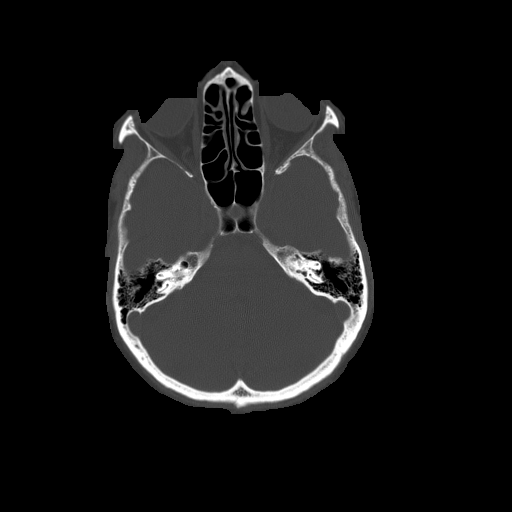
[im 18/56  bone]
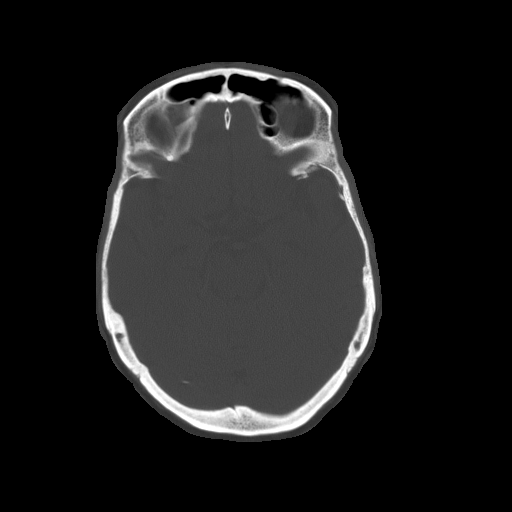
[im 24/56  bone]
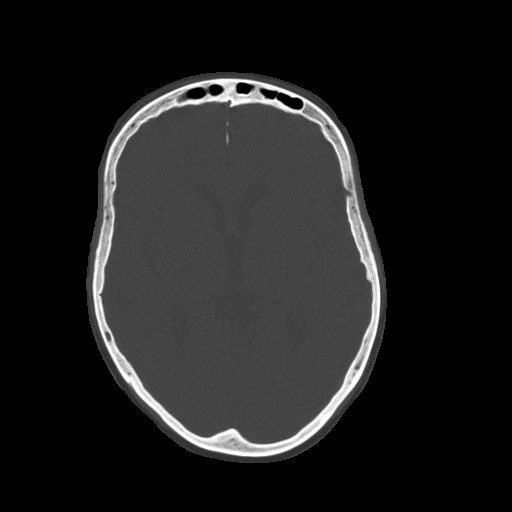
[im 32/56  bone]
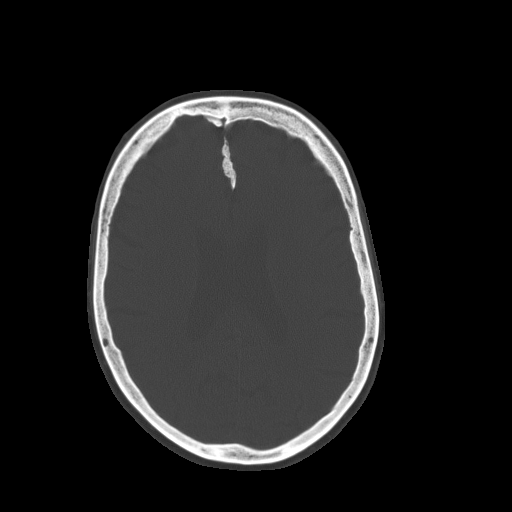
[im 38/56  bone]
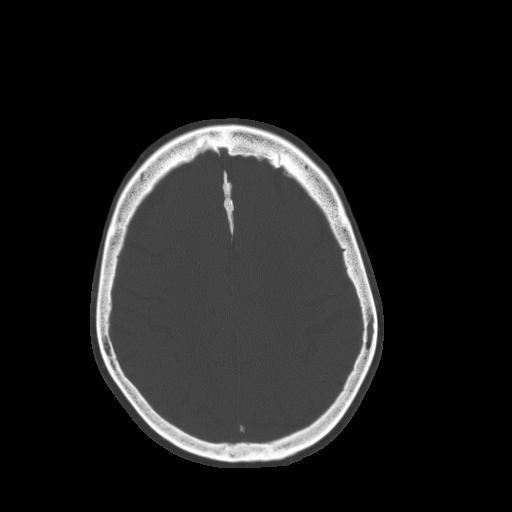
[im 44/56  bone]
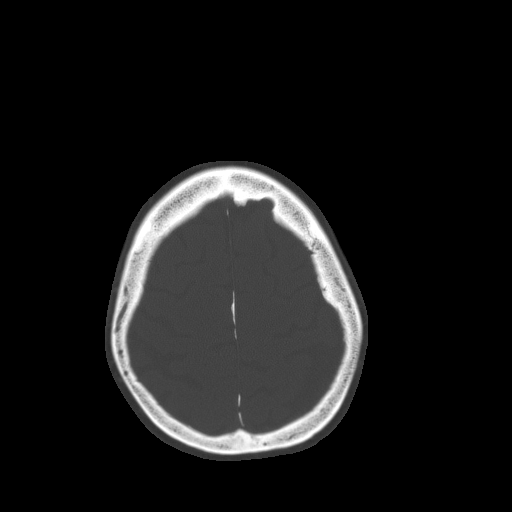
[im 50/56  bone]
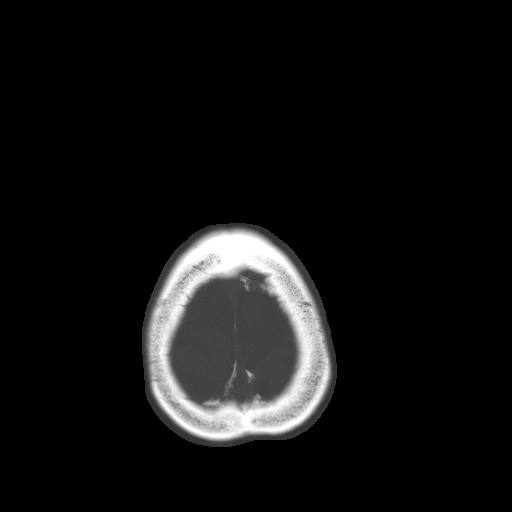

[16 of 30 positions shown; findings below may reference images not displayed]

FINDINGS: No acute intracranial abnormality.  Specifically, no
hemorrhage, hydrocephalus, mass lesion, acute infarction, or
significant intracranial injury.  No acute calvarial abnormality.
Extensive dural calcifications, stable. Visualized paranasal
sinuses and mastoids clear.  Orbital soft tissues unremarkable.
IMPRESSION: No acute intracranial abnormality.

## 2013-03-30 NOTE — Telephone Encounter (Signed)
Pts daughter called stating pt wanted to know if she has internal scarring from past abd surgery that causes occasional abd discomfort. I advised her that we have not seen pt for several years and cannot answer this question. I advised her to call Dr Donette Larry to have any symptoms worked up. She states she understands and will f/u with her PCP office.

## 2013-04-06 ENCOUNTER — Encounter: Payer: Medicare Other | Admitting: Physical Medicine and Rehabilitation

## 2013-04-07 ENCOUNTER — Ambulatory Visit
Admission: RE | Admit: 2013-04-07 | Discharge: 2013-04-07 | Disposition: A | Discharge status: Home / Self Care | Payer: Medicare Other | Source: Ambulatory Visit | Attending: Internal Medicine | Admitting: Internal Medicine

## 2013-04-07 DIAGNOSIS — Z1231 Encounter for screening mammogram for malignant neoplasm of breast: Secondary | ICD-10-CM

## 2013-04-08 ENCOUNTER — Ambulatory Visit: Payer: Medicare Other

## 2013-04-13 IMAGING — RF DG ESOPHAGUS
15 of 18 series · 19 of 24 positions shown · non-contrast
Comparison: None.

CLINICAL DATA: Dysphagia

ESOPHOGRAM/BARIUM SWALLOW
TECHNIQUE: Combined double contrast and single contrast
examination performed using effervescent crystals, thick barium
liquid, and thin barium liquid.
Fluoroscopy time:  1.3 minutes.

[Series 1: run · 1 of 1 slices shown (1 of 15)]
[im 1/1]
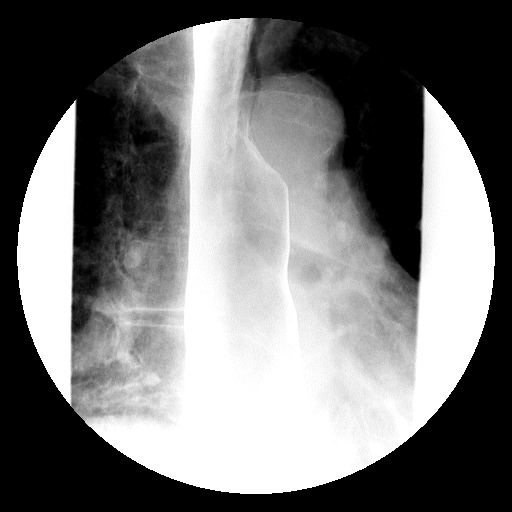

[Series 2: run · 1 of 1 slices shown (2 of 15)]
[im 1/1]
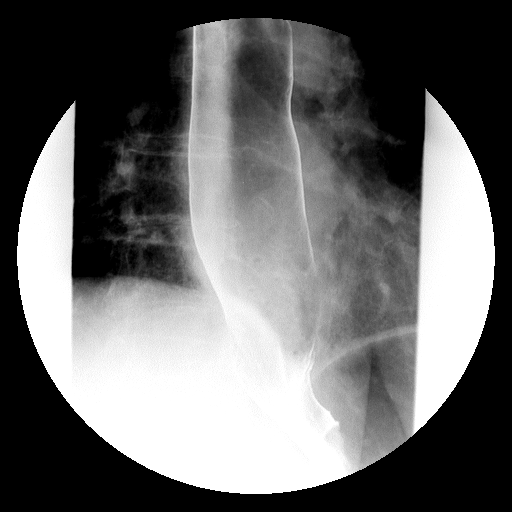

[Series 4: run · 1 of 1 slices shown (3 of 15)]
[im 1/1]
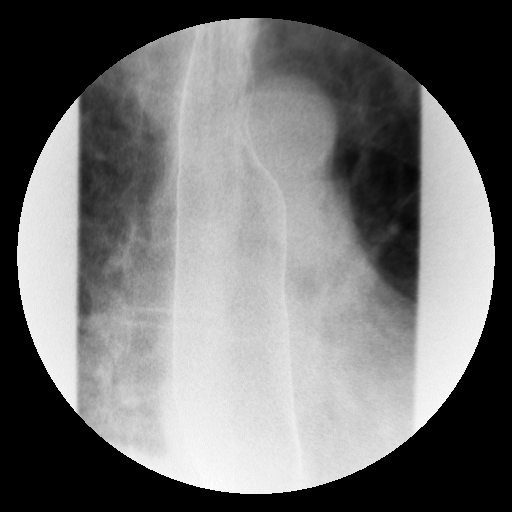

[Series 5: run · 1 of 1 slices shown (4 of 15)]
[im 1/1]
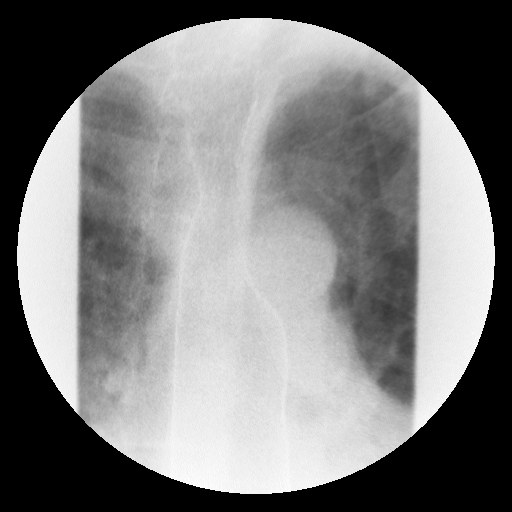

[Series 6: run · 4 of 9 slices shown (5 of 15)]
[im 1/9]
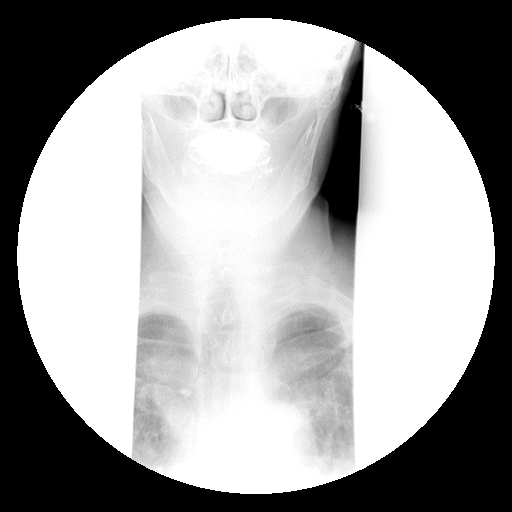
[im 3/9]
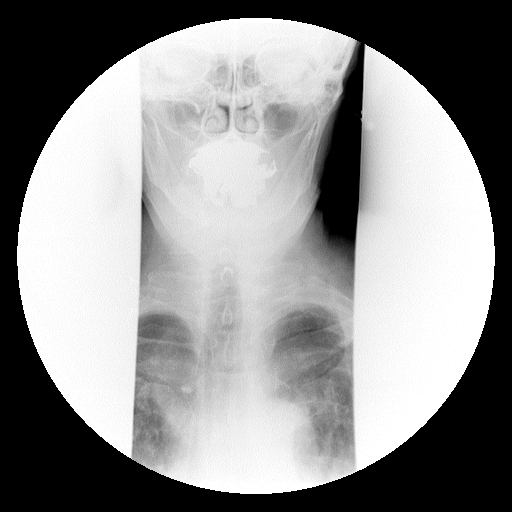
[im 7/9]
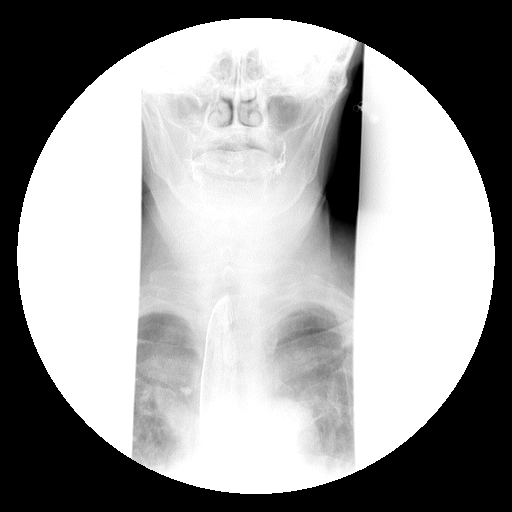
[im 9/9]
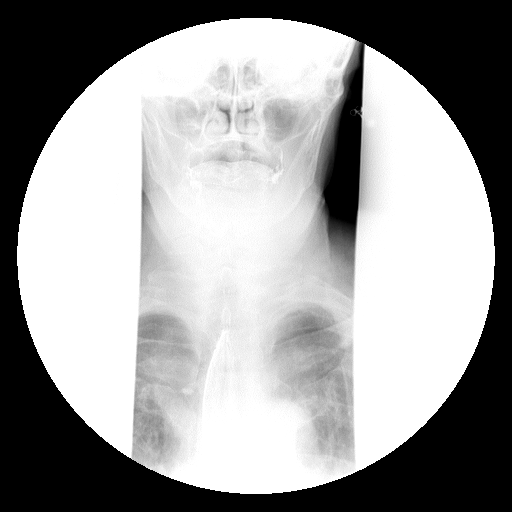

[Series 7: run · 2 of 7 slices shown (6 of 15)]
[im 1/7]
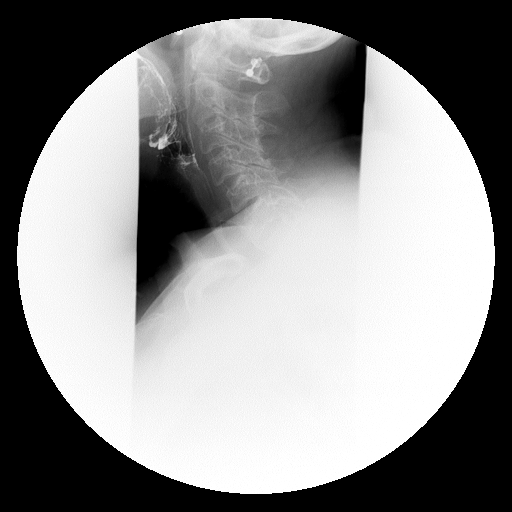
[im 7/7]
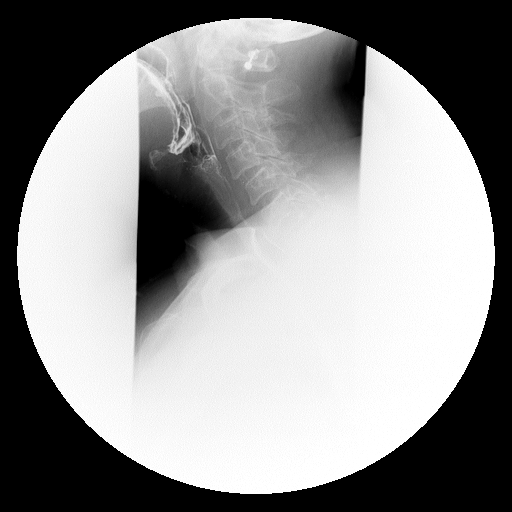

[Series 9: run · 1 of 1 slices shown (7 of 15)]
[im 1/1]
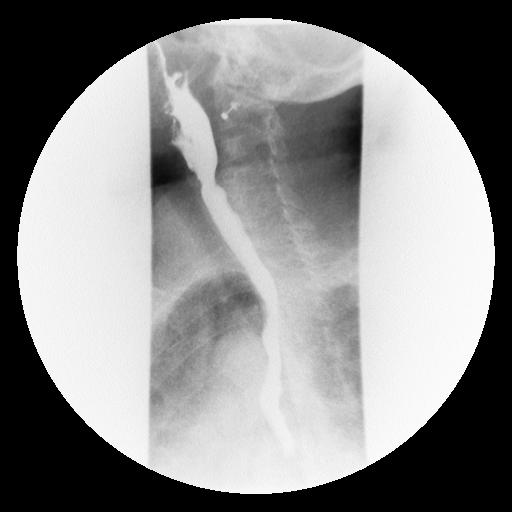

[Series 10: run · 1 of 1 slices shown (8 of 15)]
[im 1/1]
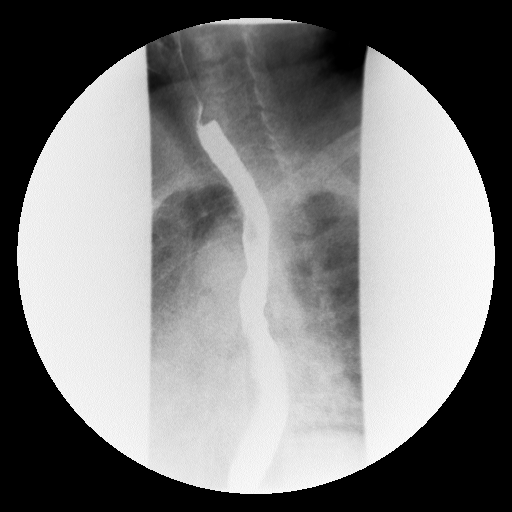

[Series 11: run · 1 of 1 slices shown (9 of 15)]
[im 1/1]
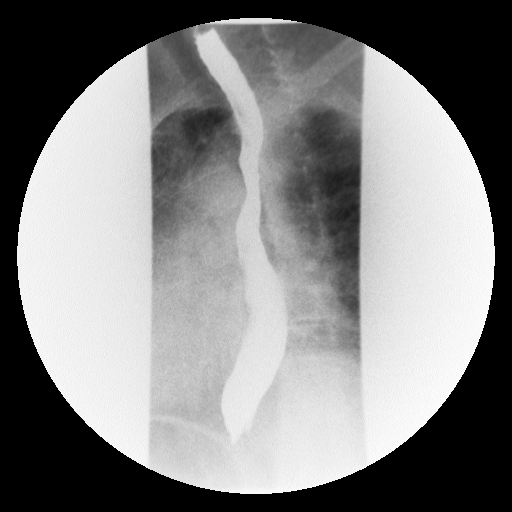

[Series 13: run · 1 of 1 slices shown (10 of 15)]
[im 1/1]
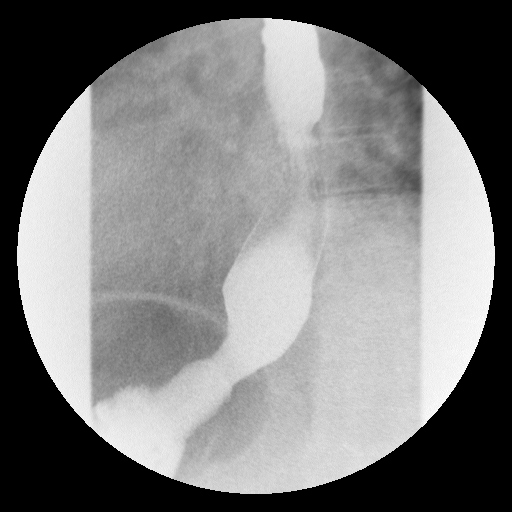

[Series 14: run · 1 of 1 slices shown (11 of 15)]
[im 1/1]
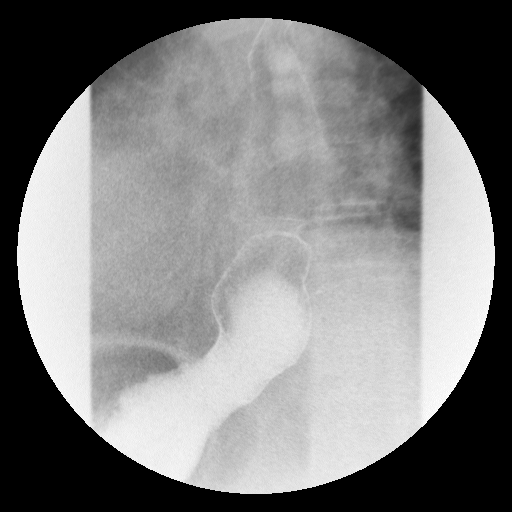

[Series 15: run · 1 of 1 slices shown (12 of 15)]
[im 1/1]
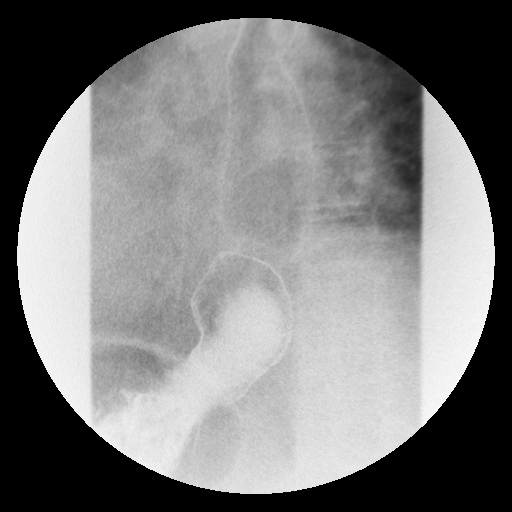

[Series 16: run · 1 of 1 slices shown (13 of 15)]
[im 1/1]
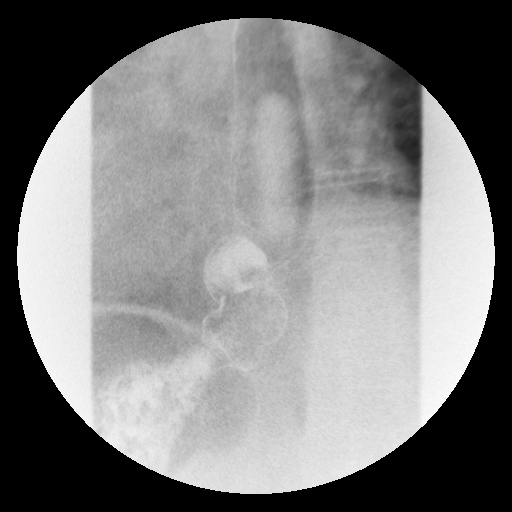

[Series 18: run · 1 of 1 slices shown (14 of 15)]
[im 1/1]
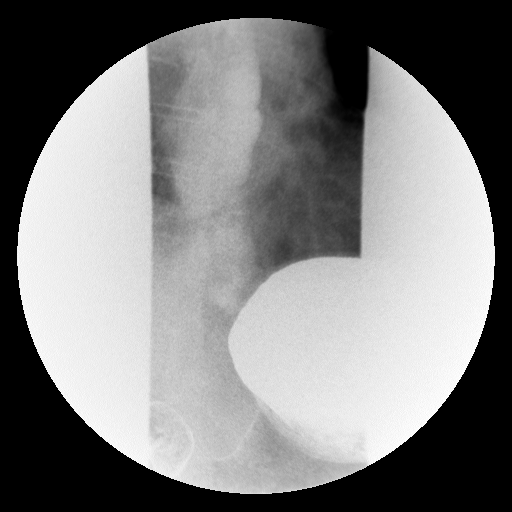

[Series 19: run · 1 of 1 slices shown (15 of 15)]
[im 1/1]
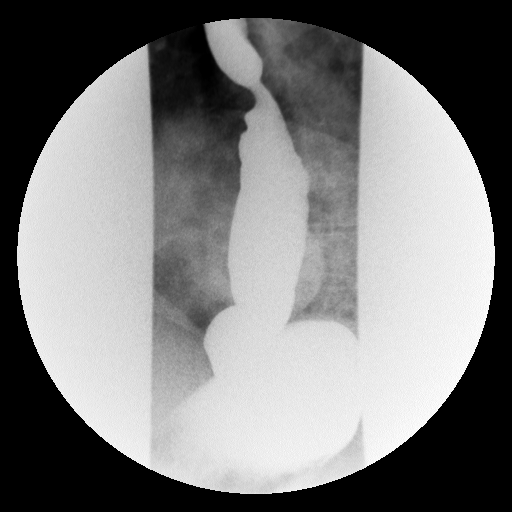

[19 of 24 positions shown; findings below may reference images not displayed]

FINDINGS: Initially a double contrast study was performed.  The
mucosa of the esophagus is unremarkable.  A single contrast study
shows the swallowing mechanism to be normal.  There are mild
tertiary contractions in the mid and distal esophagus.  A small
hiatal hernia is present.  Significant gastroesophageal reflux is
noted.  A barium pill was given at the end of the study which
passed into the stomach without delay.
IMPRESSION: 1.  Significant gastroesophageal reflux.  Barium pill passes into
the stomach without delay.
2.  Small hiatal hernia.
3.  Mild tertiary contractions in the mid and distal esophagus.

## 2013-05-03 ENCOUNTER — Encounter (INDEPENDENT_AMBULATORY_CARE_PROVIDER_SITE_OTHER): Payer: Self-pay

## 2013-05-03 ENCOUNTER — Ambulatory Visit (INDEPENDENT_AMBULATORY_CARE_PROVIDER_SITE_OTHER): Payer: Medicare Other | Admitting: Diagnostic Neuroimaging

## 2013-05-03 ENCOUNTER — Encounter: Payer: Self-pay | Admitting: Diagnostic Neuroimaging

## 2013-05-03 VITALS — BP 150/86 | HR 82 | Temp 98.4°F | Ht 65.5 in | Wt 203.0 lb

## 2013-05-03 DIAGNOSIS — R6889 Other general symptoms and signs: Secondary | ICD-10-CM

## 2013-05-03 DIAGNOSIS — R413 Other amnesia: Secondary | ICD-10-CM

## 2013-05-03 DIAGNOSIS — Z79899 Other long term (current) drug therapy: Secondary | ICD-10-CM | POA: Diagnosis not present

## 2013-05-03 DIAGNOSIS — R269 Unspecified abnormalities of gait and mobility: Secondary | ICD-10-CM | POA: Diagnosis not present

## 2013-05-03 NOTE — Patient Instructions (Signed)
Reduce your caffeine intake. Try to drink water only.  I will check MRI and lab testing.  I will refer you to home Physical therapy.  Use a walker.

## 2013-05-03 NOTE — Progress Notes (Signed)
[facility]  PATIENT: [name] DOB: July 21, 1940  REFERRING CLINICIAN: Husain HISTORY FROM: patient., daughter, friend REASON FOR VISIT: re-referral   HISTORICAL  CHIEF COMPLAINT:  Chief Complaint  Patient presents with  . Dizziness  . Loss of Consciousness    HISTORY OF PRESENT ILLNESS:    UPDATE 05/03/13: Patient presents as referral for recurrent dizziness, balance difficulty, passing out. Patient tells me that she is feeling swimmy headed, having spinning sensation with vertigo, lasting a few minutes at a time. This typically triggered by changing position. Sometimes this happens when she is sitting or without movement. Patient is also episodes of passing out, with loss of consciousness for a few minutes. No convulsions. She has prodromal lightheadedness this happens. No post ictal confusion. No tongue biting or incontinence with these episodes.  Also significantly noted is poor balance and gait with intermittent falling down. Her depression and anxiety are also poorly controlled at this point. She has significant hallucinations, racing thoughts, poor sleep. Patient's memory is getting worse over time. She does not remember seeing me in October 2013.  PRIOR HPI (03/13/12): 72 year old right-handed female with history of hypertension, hypercholesterolemia, depression, anxiety, here for evaluation of dizziness.  For past 6-7 units, patient has been having intermittent episodes of room spinning sensation, lasting 45 seconds at a time. No nausea vomiting. Episodes typically triggered when she turns her head too quickly, tilt her head up or down. She denies any slurred speech, trouble talking, trouble swallowing, double vision.  Patient had one episode while she was sitting down, without head turning movement, where she felt dizzy, spinning, and then passed out.  REVIEW OF SYSTEMS: Full 14 system review of systems performed and notable only for weight gain  fatigue anemia spinning sensation blurred vision loss of vision shortness of breath wheezing incontinence anemia joint pain joint swelling memory loss headache weakness dizziness passing out tremor depression anxiety decreased energy hallucinations racing thoughts sleepiness.  ALLERGIES: Allergies  Allergen Reactions  . Ambien [Zolpidem Tartrate]   . Ampicillin-Sulbactam Sodium Other (See Comments)    Muscles draw up tight - severe headache  . Oxycontin [Oxycodone Hcl]   . Toradol [Ketorolac Tromethamine]   . Tramadol     HOME MEDICATIONS: Outpatient Prescriptions Prior to Visit  Medication Sig Dispense Refill  . albuterol (ACCUNEB) 1.25 MG/3ML nebulizer solution Take 1 ampule by nebulization every 6 (six) hours as needed for wheezing.      Marland Kitchen albuterol (PROVENTIL HFA;VENTOLIN HFA) 108 (90 BASE) MCG/ACT inhaler Inhale 2 puffs into the lungs every 6 (six) hours as needed for wheezing.      Marland Kitchen ALPRAZolam (XANAX) 1 MG tablet Take 1 mg by mouth at bedtime.      Marland Kitchen amitriptyline (ELAVIL) 25 MG tablet Take 25 mg by mouth at bedtime.      Marland Kitchen amLODipine-benazepril (LOTREL) 5-10 MG per capsule Take 1 capsule by mouth daily.      . citalopram (CELEXA) 40 MG tablet Take 40 mg by mouth daily.      Marland Kitchen esomeprazole (NEXIUM) 40 MG capsule Take 40 mg by mouth 2 (two) times daily.      Marland Kitchen HYDROcodone-acetaminophen (VICODIN) 5-500 MG per tablet Take 1 tablet by mouth every 6 (six) hours as needed. For pain      . ibuprofen (ADVIL,MOTRIN) 200 MG tablet Take 600 mg by mouth daily as needed for pain.      Marland Kitchen lovastatin (MEVACOR) 10 MG tablet Take 10 mg by mouth daily with lunch.      Marland Kitchen  meclizine (ANTIVERT) 12.5 MG tablet Take 12.5 mg by mouth 3 (three) times daily as needed for nausea.      . ondansetron (ZOFRAN) 4 MG tablet Take 1 tablet (4 mg total) by mouth every 6 (six) hours.  18 tablet  0  . polyethylene glycol powder (GLYCOLAX/MIRALAX) powder Take 17 g by mouth daily. For constipation      . senna (SENOKOT)  8.6 MG tablet Take 1 tablet by mouth daily.      . Soft Lens Products (RA REWETTING DROPS) SOLN 1 application by Does not apply route daily.      . vitamin B-12 (CYANOCOBALAMIN) 1000 MCG tablet Take 1,000 mcg by mouth daily.      Marland Kitchen aspirin 81 MG chewable tablet Chew 324 mg by mouth once.      . docusate sodium (COLACE) 100 MG capsule Take 100 mg by mouth daily.      . Probiotic Product (MISC INTESTINAL FLORA REGULAT) CHEW Chew 1 each by mouth daily.      Marland Kitchen tiZANidine (ZANAFLEX) 2 MG tablet Take 2 mg by mouth every 6 (six) hours as needed (for muscle relaxation).       No facility-administered medications prior to visit.    PAST MEDICAL HISTORY: Past Medical History  Diagnosis Date  . Hiatal hernia   . Hypertension   . Anemia   . GERD (gastroesophageal reflux disease)   . Hyperlipidemia   . CKD (chronic kidney disease)   . COPD (chronic obstructive pulmonary disease)   . Depression     PAST SURGICAL HISTORY: Past Surgical History  Procedure Laterality Date  . Abdominal surgery    . Colon surgery    . Joint replacement    . Cholecystectomy    . Abdominal hysterectomy    . Appendectomy    . Bladder surgery    . Rectocele repair      FAMILY HISTORY: Family History  Problem Relation Age of Onset  . Heart failure Mother   . Heart attack Father     SOCIAL HISTORY:  History   Social History  . Marital Status: Widowed    Spouse Name: N/A    Number of Children: 3  . Years of Education: middle sch   Occupational History  .      n/a   Social History Main Topics  . Smoking status: Former Smoker -- 1.50 packs/day for 30 years    Types: Cigarettes    Quit date: 11/27/2012  . Smokeless tobacco: Never Used     Comment: e cig  . Alcohol Use: No  . Drug Use: No  . Sexual Activity: Not on file   Other Topics Concern  . Not on file   Social History Narrative   Patient lives at home with daughter.   Caffeine Use: 4 cups daily     PHYSICAL EXAM  Filed Vitals:     05/03/13 1041 05/03/13 1112 05/03/13 1113  BP:  145/84 150/86  Pulse:  69 82  Temp: 98.4 F (36.9 C)    TempSrc: Oral    Height: 5' 5.5" (1.664 m)    Weight: 203 lb (92.08 kg)      Not recorded    Body mass index is 33.26 kg/(m^2).  GENERAL EXAM: Patient is in no distress; well developed, nourished and groomed; neck is supple  CARDIOVASCULAR: Regular rate and rhythm, no murmurs, no carotid bruits  NEUROLOGIC: MENTAL STATUS: awake, alert, oriented to person, place and time, recent and remote memory  intact, normal attention and concentration, language fluent, comprehension intact, naming intact, fund of knowledge appropriate; MMSE 28/30.  CRANIAL NERVE: no papilledema on fundoscopic exam, pupils equal and reactive to light, visual fields full to confrontation, extraocular muscles intact, no nystagmus, facial sensation and strength symmetric, hearing intact, palate elevates symmetrically, uvula midline, shoulder shrug symmetric, tongue midline. MOTOR: normal bulk and tone, full strength in the BUE, BLE SENSORY: normal and symmetric to light touch, temperature, vibration COORDINATION: finger-nose-finger --> DYSMETRIA REFLEXES: deep tendon reflexes present and symmetric GAIT/STATION: UNSTEADY GAIT; STAGGERING ATAXIC GAIT    DIAGNOSTIC DATA (LABS, IMAGING, TESTING) - I reviewed patient records, labs, notes, testing and imaging myself where available.  Lab Results  Component Value Date   WBC 6.7 10/16/2012   HGB 14.8 10/16/2012   HCT 44.7 10/16/2012   MCV 90.1 10/16/2012   PLT 223 10/16/2012      Component Value Date/Time   NA 143 10/16/2012 2033   K 3.7 10/16/2012 2033   CL 103 10/16/2012 2033   CO2 29 10/16/2012 2033   GLUCOSE 105* 10/16/2012 2033   BUN 8 10/16/2012 2033   CREATININE 1.21* 10/16/2012 2033   CALCIUM 10.2 10/16/2012 2033   PROT 8.4* 10/16/2012 2033   ALBUMIN 3.9 10/16/2012 2033   AST 158* 10/16/2012 2033   ALT 68* 10/16/2012 2033   ALKPHOS 99 10/16/2012 2033    BILITOT 0.5 10/16/2012 2033   GFRNONAA 44* 10/16/2012 2033   GFRAA 51* 10/16/2012 2033   No results found for this basename: CHOL, HDL, LDLCALC, LDLDIRECT, TRIG, CHOLHDL   No results found for this basename: HGBA1C   No results found for this basename: VITAMINB12   No results found for this basename: TSH    03/30/12 MRI BRAIN - 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 There mild changes of chronic microvascular ischemia and generalized cerebral atrophy.  03/30/12 MRA head - mild atheromatous changes in the terminal branch vessels.  03/30/12 MRA neck - normal   ASSESSMENT AND PLAN  72 y.o. year old female here with:   "Dizziness" (balance difficulty + vertigo + syncope)  Memory loss  Headaches  Depression  Anxiety   PLAN: - further testing as below - home health PT - consider psychiatry eval for depression/anxiety - consider cardiology evaluation of syncopal events; these do not sound like TIA, stroke or seizure.  Orders Placed This Encounter  Procedures  . MR Brain Wo Contrast  . Vitamin B12  . Hemoglobin A1c  . TSH  . Home Health  . Face-to-face encounter (required for Medicare/Medicaid patients)    Return in about 3 months (around 08/01/2013) for with Heide Guile or Demiana Crumbley.    Suanne Marker, MD 05/03/2013, 12:08 PM Certified in Neurology, Neurophysiology and Neuroimaging  [facility] [address], [address] [place], [place] [address] [phone]

## 2013-05-26 ENCOUNTER — Inpatient Hospital Stay: Admission: RE | Admit: 2013-05-26 | Payer: Medicare Other | Source: Ambulatory Visit

## 2013-05-30 ENCOUNTER — Inpatient Hospital Stay: Admission: RE | Admit: 2013-05-30 | Payer: Medicare Other | Source: Ambulatory Visit

## 2013-06-03 ENCOUNTER — Telehealth: Payer: Self-pay | Admitting: Diagnostic Neuroimaging

## 2013-06-03 NOTE — Telephone Encounter (Signed)
Patient's daughter called stating that she has left several messages and sent a letter to administrator regarding getting an appointment with a different doctor. Patient's daughter is very frustrated and states her mother needs to be seen because her dizziness is not getting any better. Please call.

## 2013-06-04 NOTE — Telephone Encounter (Signed)
Patient is wishing to change physicians

## 2013-06-11 ENCOUNTER — Ambulatory Visit
Admission: RE | Admit: 2013-06-11 | Discharge: 2013-06-11 | Disposition: A | Discharge status: Home / Self Care | Payer: Medicare Other | Source: Ambulatory Visit | Attending: Diagnostic Neuroimaging | Admitting: Diagnostic Neuroimaging

## 2013-06-11 DIAGNOSIS — R269 Unspecified abnormalities of gait and mobility: Secondary | ICD-10-CM

## 2013-06-11 DIAGNOSIS — R413 Other amnesia: Secondary | ICD-10-CM

## 2013-06-17 ENCOUNTER — Telehealth: Payer: Self-pay | Admitting: Nurse Practitioner

## 2013-06-17 IMAGING — RF DG UGI W/ SMALL BOWEL HIGH DENSITY
18 of 24 series · 18 of 24 positions shown · non-contrast
Comparison: CT abdomen pelvis 09/11/2010.

CLINICAL DATA: 71-year-old with history of colon resection for
perforated diverticulitis, long history of GE reflux disease,
presenting with severe postprandial nausea and vomiting and
intermittent severe cramping mid abdominal pain.

UPPER GI SERIES WITH SMALL BOWEL FOLLOW-THROUGH 09/13/2011:
TECHNIQUE: Upper GI series was performed in the routine fashion
using effervescent crystals, thick barium liquid, and thin barium
liquid.  Small bowel examination was performed after oral
administration of 1-1/2 cups of EnteroVu.  Fluoroscopic evaluation
of the small bowel was performed, including the terminal ileum.
Radiation dose was minimized by using pulsed fluoroscopy and last
image hold on many of the images.

[Series 1: run · 1 of 1 slices shown (1 of 18)]
[im 1/1]
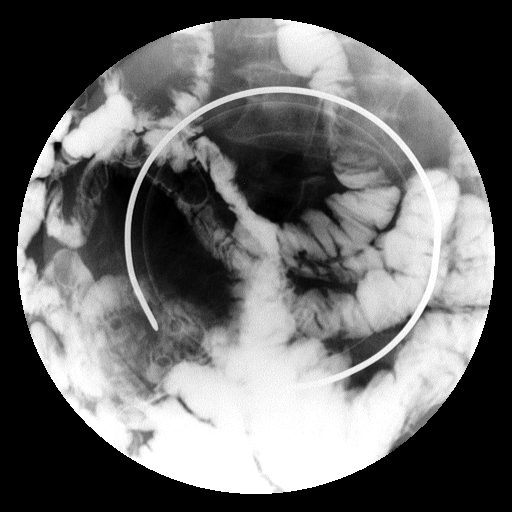

[Series 2: run · 1 of 1 slices shown (2 of 18)]
[im 1/1]
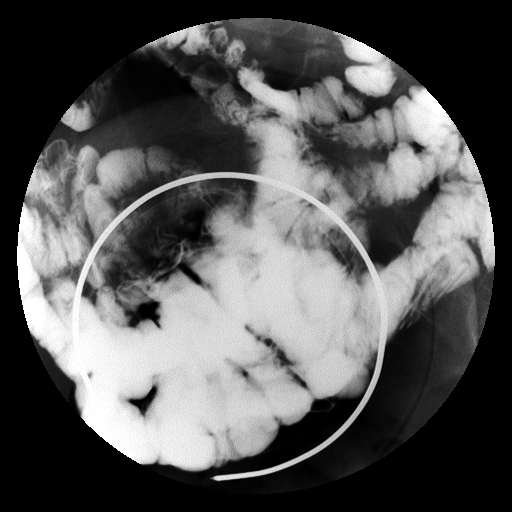

[Series 2: run · 1 of 1 slices shown (3 of 18)]
[im 1/1]
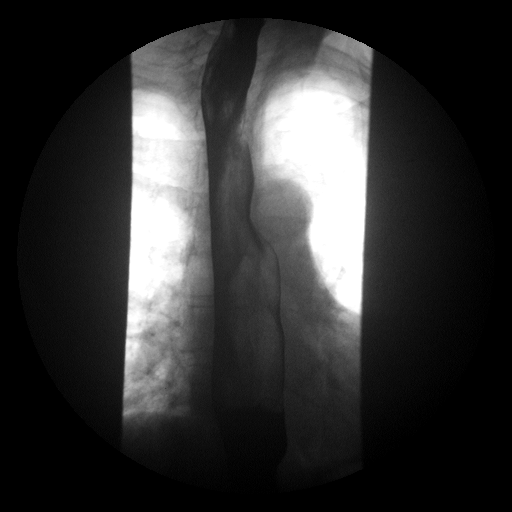

[Series 3: run · 1 of 1 slices shown (4 of 18)]
[im 1/1]
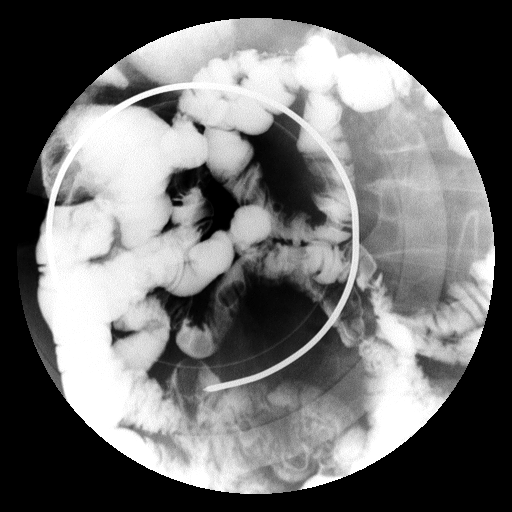

[Series 4: run · 1 of 1 slices shown (5 of 18)]
[im 1/1]
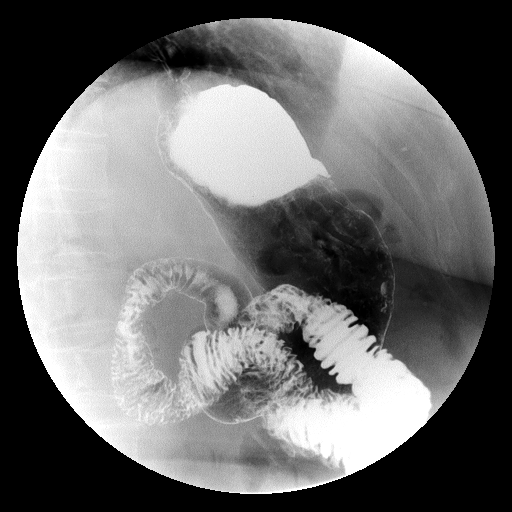

[Series 4: run · 1 of 1 slices shown (6 of 18)]
[im 1/1]
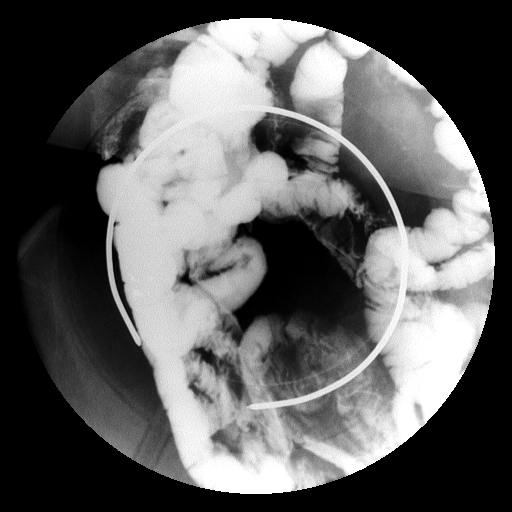

[Series 5: run · 1 of 1 slices shown (7 of 18)]
[im 1/1]
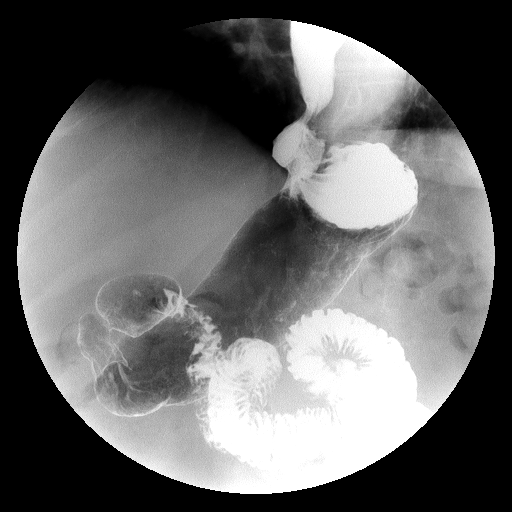

[Series 6: run · 1 of 1 slices shown (8 of 18)]
[im 1/1]
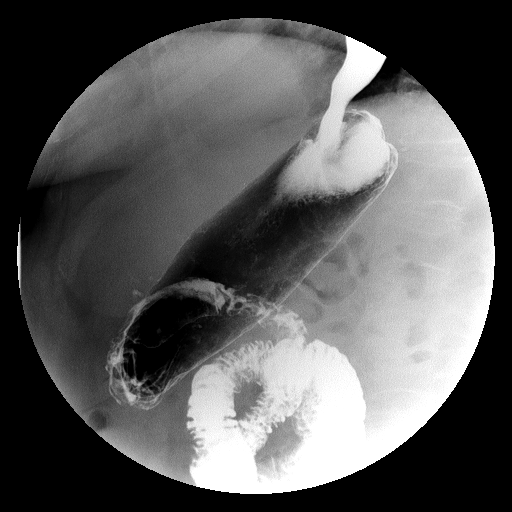

[Series 6: run · 1 of 1 slices shown (9 of 18)]
[im 1/1]
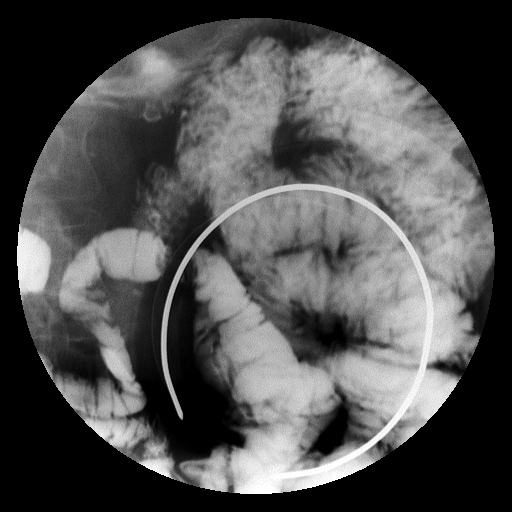

[Series 7: run · 1 of 1 slices shown (10 of 18)]
[im 1/1]
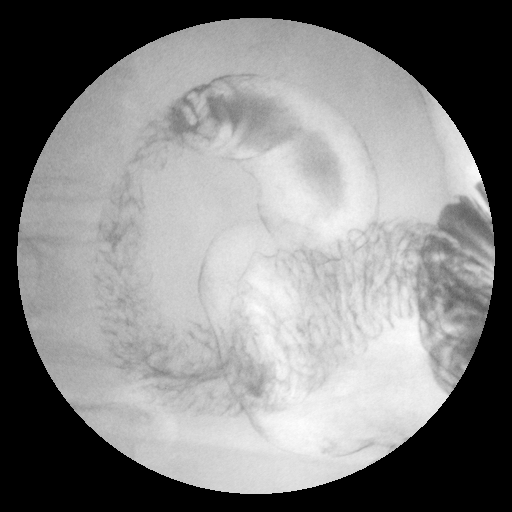

[Series 8: run · 1 of 1 slices shown (11 of 18)]
[im 1/1]
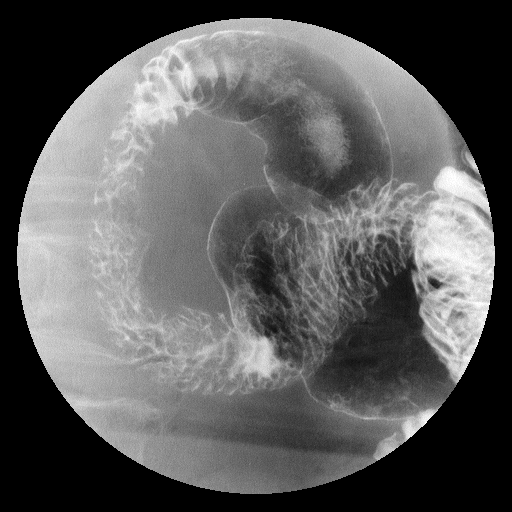

[Series 8: run · 1 of 1 slices shown (12 of 18)]
[im 1/1]
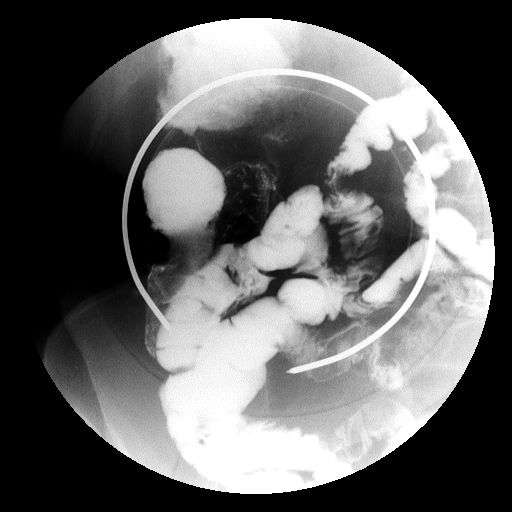

[Series 9: run · 1 of 1 slices shown (13 of 18)]
[im 1/1]
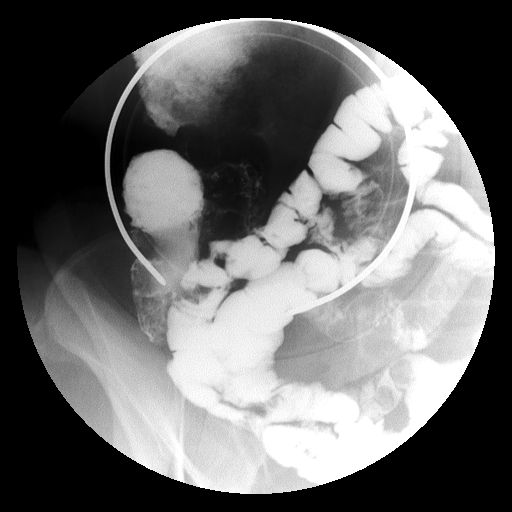

[Series 10: run · 1 of 1 slices shown (14 of 18)]
[im 1/1]
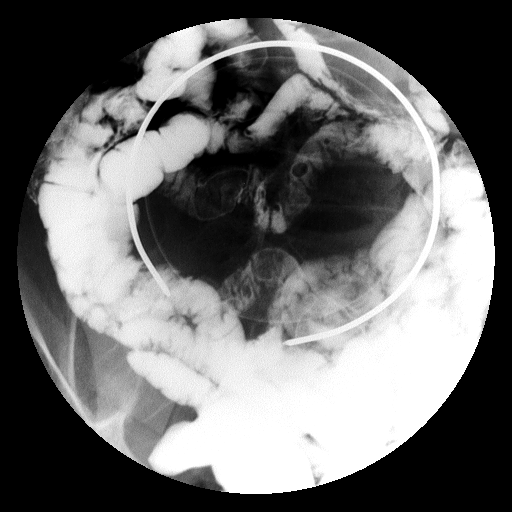

[Series 10: run · 1 of 1 slices shown (15 of 18)]
[im 1/1]
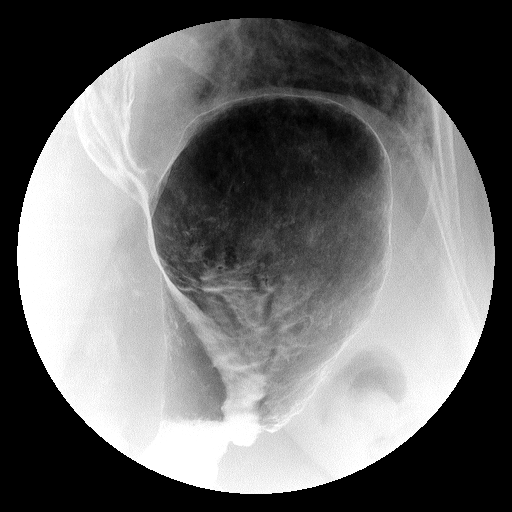

[Series 11: run · 1 of 1 slices shown (16 of 18)]
[im 1/1]
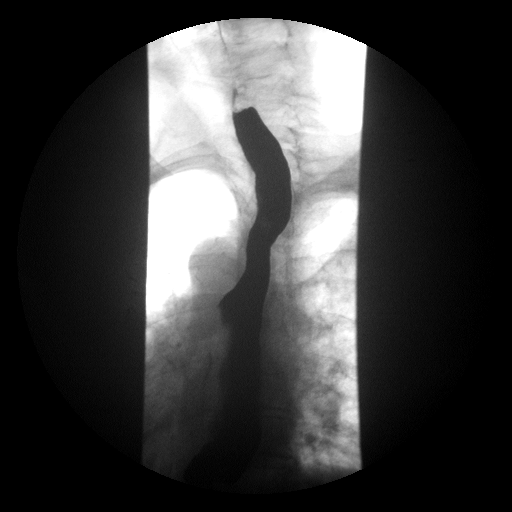

[Series 13: run · 1 of 1 slices shown (17 of 18)]
[im 1/1]
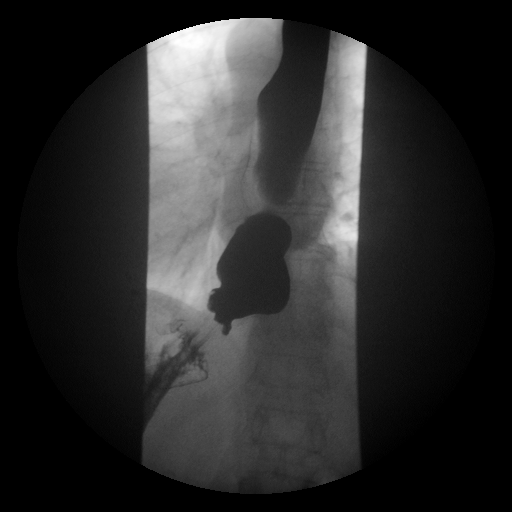

[Series 14: run · 1 of 1 slices shown (18 of 18)]
[im 1/1]
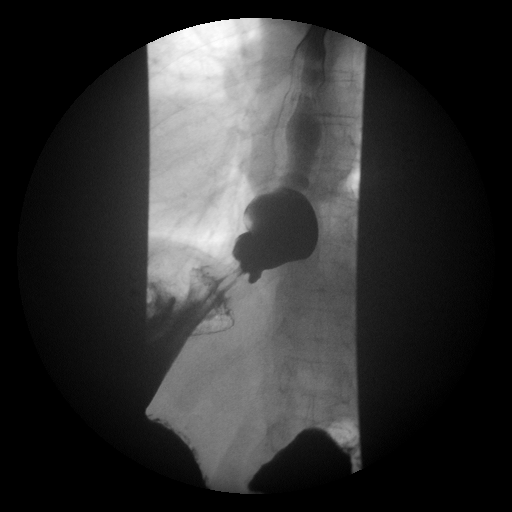

[18 of 24 positions shown; findings below may reference images not displayed]

Upper GI series
11/25/2008.  Esophagram 07/10/2011.  Two-view abdomen x-ray
02/05/2011.
FINDINGS: Preliminary scout image demonstrates a normal bowel gas
pattern.  Moderate stool burden.  Aortic and visceral artery
atherosclerosis without visible aneurysm.  No opaque urinary tract
calculi.

Patient swallowed the thick and thin barium liquid without
difficulty.  Moderate to severe esophageal dysmotility, with
breakup of the primary wave in the mid esophagus and intermittent
to-and-fro esophageal motion.  Small hiatal hernia with extensive
spontaneous gastroesophageal reflux throughout the examination.  No
fixed esophageal strictures or masses.

Stomach otherwise normal in appearance and empties normally.
Duodenal bulb and duodenal sweep normal in appearance.  No evidence
of active peptic ulcer disease.

Transit time through the small bowel is normal, with barium
reaching the colon in approximately 1-1/2 hours.  There is an
adhesion in the upper pelvis to the right of midline, without
evidence of bowel obstruction currently.  No intrinsic
abnormalities were identified involving the small bowel, though the
terminal ileum was incompletely distended (I performed intermittent
fluoroscopy of the terminal ileum for approximately 5 minutes, and
the TI never filled completely).  The terminal ileal folds are not
thickened while decompressed.

The patient experienced one episode of vomiting during the
examination, immediately after the upper GI portion.

Fluoroscopy time: 6.9 minutes, pulsed fluoroscopy
IMPRESSION: 1.  Non-obstructing adhesion just to the right of midline in the
upper pelvis.
2.  No intrinsic abnormalities involving the small bowel.

3.  Small hiatal hernia with extensive spontaneous gastroesophageal
reflux during the examination.  The patient experienced one episode
of vomiting during the examination.
4.  Severe esophageal dysmotility.
5.  Otherwise normal-appearing stomach which empties normally.

Preliminary results were discussed with the patient at the time of
the examination.

## 2013-06-17 NOTE — Telephone Encounter (Signed)
Patient wants to be seen sooner than scheduled 2/6/ appointment because she is dizzy. Please call patient to schedule.

## 2013-06-17 NOTE — Telephone Encounter (Signed)
Called patient to make an sooner appt. On 06/18/13 at 9:30 am instead of 07/02/13. I advised patient that if she has any other problems, questions or concerns to call the office. Patient verbalized understanding.

## 2013-06-17 NOTE — Telephone Encounter (Signed)
Had an MRI and is now having dizzy spells and she states lastnight she felt like she had a mini stroke because her head felt funny

## 2013-06-18 ENCOUNTER — Ambulatory Visit (INDEPENDENT_AMBULATORY_CARE_PROVIDER_SITE_OTHER): Payer: Medicare Other | Admitting: Nurse Practitioner

## 2013-06-18 ENCOUNTER — Encounter: Payer: Self-pay | Admitting: Nurse Practitioner

## 2013-06-18 VITALS — BP 131/87 | HR 73 | Ht 65.0 in | Wt 203.0 lb

## 2013-06-18 DIAGNOSIS — R269 Unspecified abnormalities of gait and mobility: Secondary | ICD-10-CM | POA: Diagnosis not present

## 2013-06-18 DIAGNOSIS — Z5181 Encounter for therapeutic drug level monitoring: Secondary | ICD-10-CM

## 2013-06-18 DIAGNOSIS — R42 Dizziness and giddiness: Secondary | ICD-10-CM | POA: Diagnosis not present

## 2013-06-18 DIAGNOSIS — R5381 Other malaise: Secondary | ICD-10-CM

## 2013-06-18 DIAGNOSIS — G629 Polyneuropathy, unspecified: Secondary | ICD-10-CM

## 2013-06-18 DIAGNOSIS — R5383 Other fatigue: Secondary | ICD-10-CM | POA: Diagnosis not present

## 2013-06-18 DIAGNOSIS — G589 Mononeuropathy, unspecified: Secondary | ICD-10-CM

## 2013-06-18 MED ORDER — TRAZODONE 25 MG HALF TABLET: 25.0000 mg | ORAL_TABLET | Freq: Every day | ORAL | Status: DC

## 2013-06-18 NOTE — Progress Notes (Signed)
PATIENT: [name] DOB: Nov 15, 1940   REASON FOR VISIT: sooner follow up for dizziness HISTORY FROM: patient  HISTORY OF PRESENT ILLNESS: UPDATE 06/18/13 (LL):  Patient returns for follow up.  Daughter requests sooner revisit, patient's dizziness has not improved.  She did not get labs drawn that were ordered at last visit (B12, TSH, and HgbA1C).  She states her PCP checks her sugar and B12.  Her dizziness is mainly brought on by quick changes in position.  She states she has tried vestibular rehab exercises before and it did not help.  She denies falls.  She has tried Meclizine in the past with no benefit.  UPDATE 05/03/13: Patient presents as referral for recurrent dizziness, balance difficulty, passing out. Patient tells me that she is feeling swimmy headed, having spinning sensation with vertigo, lasting a few minutes at a time. This typically triggered by changing position. Sometimes this happens when she is sitting or without movement. Patient is also episodes of passing out, with loss of consciousness for a few minutes. No convulsions. She has prodromal lightheadedness this happens. No post ictal confusion. No tongue biting or incontinence with these episodes.  Also significantly noted is poor balance and gait with intermittent falling down. Her depression and anxiety are also poorly controlled at this point. She has significant hallucinations, racing thoughts, poor sleep. Patient's memory is getting worse over time. She does not remember seeing me in October 2013.  PRIOR HPI (03/13/12): 73 year old right-handed female with history of hypertension, hypercholesterolemia, depression, anxiety, here for evaluation of dizziness.  For past 6-7 units, patient has been having intermittent episodes of room spinning sensation, lasting 45 seconds at a time. No nausea vomiting. Episodes typically triggered when she turns her head too quickly, tilt her head up or down. She denies any slurred speech,  trouble talking, trouble swallowing, double vision.  Patient had one episode while she was sitting down, without head turning movement, where she felt dizzy, spinning, and then passed out.   REVIEW OF SYSTEMS: Full 14 system review of systems performed and notable only for loss of vision, vomiting,  memory loss, headache, dizziness.  ALLERGIES: Allergies  Allergen Reactions  . Ambien [Zolpidem Tartrate]   . Ampicillin-Sulbactam Sodium Other (See Comments)    Muscles draw up tight - severe headache  . Oxycontin [Oxycodone Hcl]   . Toradol [Ketorolac Tromethamine]   . Tramadol     HOME MEDICATIONS: Outpatient Prescriptions Prior to Visit  Medication Sig Dispense Refill  . albuterol (ACCUNEB) 1.25 MG/3ML nebulizer solution Take 1 ampule by nebulization every 6 (six) hours as needed for wheezing.      Marland Kitchen albuterol (PROVENTIL HFA;VENTOLIN HFA) 108 (90 BASE) MCG/ACT inhaler Inhale 2 puffs into the lungs every 6 (six) hours as needed for wheezing.      Marland Kitchen ALPRAZolam (XANAX) 1 MG tablet Take 1 mg by mouth at bedtime.      Marland Kitchen amLODipine (NORVASC) 5 MG tablet Take 1 tablet by mouth daily.      Marland Kitchen amLODipine-benazepril (LOTREL) 5-10 MG per capsule Take 1 capsule by mouth daily.      . CELEBREX 200 MG capsule Take 1 capsule by mouth 2 (two) times daily.      . citalopram (CELEXA) 40 MG tablet Take 40 mg by mouth daily.      Marland Kitchen esomeprazole (NEXIUM) 40 MG capsule Take 40 mg by mouth 2 (two) times daily.      Marland Kitchen HYDROcodone-acetaminophen (VICODIN) 5-500 MG per tablet Take 1  tablet by mouth every 6 (six) hours as needed. For pain      . ibuprofen (ADVIL,MOTRIN) 200 MG tablet Take 600 mg by mouth daily as needed for pain.      Marland Kitchen lovastatin (MEVACOR) 10 MG tablet Take 10 mg by mouth daily with lunch.      . meclizine (ANTIVERT) 12.5 MG tablet Take 12.5 mg by mouth 3 (three) times daily as needed for nausea.      . ondansetron (ZOFRAN) 4 MG tablet Take 1 tablet (4 mg total) by mouth every 6 (six) hours.  18  tablet  0  . polyethylene glycol powder (GLYCOLAX/MIRALAX) powder Take 17 g by mouth daily. For constipation      . senna (SENOKOT) 8.6 MG tablet Take 1 tablet by mouth daily.      . Soft Lens Products (RA REWETTING DROPS) SOLN 1 application by Does not apply route daily.      . vitamin B-12 (CYANOCOBALAMIN) 1000 MCG tablet Take 1,000 mcg by mouth daily.      Marland Kitchen amitriptyline (ELAVIL) 25 MG tablet Take 25 mg by mouth at bedtime.       No facility-administered medications prior to visit.    PAST MEDICAL HISTORY: Past Medical History  Diagnosis Date  . Hiatal hernia   . Hypertension   . Anemia   . GERD (gastroesophageal reflux disease)   . Hyperlipidemia   . CKD (chronic kidney disease)   . COPD (chronic obstructive pulmonary disease)   . Depression     PAST SURGICAL HISTORY: Past Surgical History  Procedure Laterality Date  . Abdominal surgery    . Colon surgery    . Joint replacement    . Cholecystectomy    . Abdominal hysterectomy    . Appendectomy    . Bladder surgery    . Rectocele repair      FAMILY HISTORY: Family History  Problem Relation Age of Onset  . Heart failure Mother   . Heart attack Father     SOCIAL HISTORY: History   Social History  . Marital Status: Widowed    Spouse Name: N/A    Number of Children: 3  . Years of Education: middle sch   Occupational History  . retired     n/a   Social History Main Topics  . Smoking status: Former Smoker -- 1.50 packs/day for 30 years    Types: Cigarettes    Quit date: 11/27/2012  . Smokeless tobacco: Never Used     Comment: e cig  . Alcohol Use: No  . Drug Use: No  . Sexual Activity: No   Other Topics Concern  . Not on file   Social History Narrative   Patient lives at home with daughter.   Caffeine Use: 4 cups daily     PHYSICAL EXAM  Filed Vitals:   06/18/13 0941 06/18/13 0943 06/18/13 0945 06/18/13 0946  BP: 146/87 135/87 146/87 131/87  Pulse: 69 74 69 73  Height:    5\' 5"  (1.651 m)    Weight:    203 lb (92.08 kg)   Body mass index is 33.78 kg/(m^2).  Generalized: Well developed, in no acute distress, DIX HALLPIKE PRODUCES VERTIGO; CANNOT HOLD EYES OPEN. Head: normocephalic and atraumatic. Oropharynx benign  Ears: TMs clear, canals clear Neck: Supple, no carotid bruits  Cardiac: Regular rate rhythm, no murmur  Musculoskeletal: No deformity   Neurological examination  MENTAL STATUS: awake, alert, oriented to person, place and time, recent and remote memory  intact, normal attention and concentration, language fluent, comprehension intact, naming intact, fund of knowledge appropriate.  CRANIAL NERVE:  pupils equal and reactive to light, visual fields full to confrontation, extraocular muscles intact, no nystagmus, facial sensation and strength symmetric, hearing intact, palate elevates symmetrically, uvula midline, shoulder shrug symmetric, tongue midline.  MOTOR: normal bulk and tone, full strength in the BUE, BLE  SENSORY: normal and symmetric to light touc  COORDINATION: finger-nose-finger --> DYSMETRIA  REFLEXES: deep tendon reflexes present and symmetric  GAIT/STATION: Rises to feet without assistance and is able to get on and off exam table unassisted.  Gait is wide based, tandem in not possible.  Romberg is negative.  She is able to toe and heel walk.  She uses no assistive device.  DIAGNOSTIC DATA (LABS, IMAGING, TESTING) - I reviewed patient records, labs, notes, testing and imaging myself where available.  Lab Results  Component Value Date   WBC 6.7 10/16/2012   HGB 14.8 10/16/2012   HCT 44.7 10/16/2012   MCV 90.1 10/16/2012   PLT 223 10/16/2012      Component Value Date/Time   NA 143 10/16/2012 2033   K 3.7 10/16/2012 2033   CL 103 10/16/2012 2033   CO2 29 10/16/2012 2033   GLUCOSE 105* 10/16/2012 2033   BUN 8 10/16/2012 2033   CREATININE 1.21* 10/16/2012 2033   CALCIUM 10.2 10/16/2012 2033   PROT 8.4* 10/16/2012 2033   ALBUMIN 3.9 10/16/2012 2033   AST 158*  10/16/2012 2033   ALT 68* 10/16/2012 2033   ALKPHOS 99 10/16/2012 2033   BILITOT 0.5 10/16/2012 2033   GFRNONAA 44* 10/16/2012 2033   GFRAA 51* 10/16/2012 2033   03/30/12 MRI BRAIN - 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 There mild changes of chronic microvascular ischemia and generalized cerebral atrophy.  03/30/12 MRA head - mild atheromatous changes in the terminal branch vessels.  03/30/12 MRA neck - normal   06/11/13 - Few punctate foci of non-specific gliosis in the periventricular and subcortical white matter, likely chronic small vessel ischemic disease. 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  No acute findings.  No change from MRI on 03/30/12.  ASSESSMENT AND PLAN 73 y.o. year old female  has a past medical history of Hiatal hernia; Hypertension; Anemia; GERD (gastroesophageal reflux disease); Hyperlipidemia; CKD (chronic kidney disease); COPD (chronic obstructive pulmonary disease); and Depression here with dizziness.  Dizziness is likely multi-factoral; she has found no benefit to vestibular rehab or Meclizine.  I have advised her to taper and discontinue Amitriptyline as this is a common cause of dizziness and falls in the elderly;  I am replacing with low dose Trazodone.    PLAN: We will check lab work today for your thyroid function (B12 and HgbA1c she states were done at PCP). Wean Amitrityline.  Start Trazedone 1/2 tablet at bedtime instead. Home Health PT for gait and balance training; vestibular rehab. Follow up in 4 months.  Orders Placed This Encounter  Procedures  . TSH  . Ambulatory referral to [facility] ordered this encounter  Medications  . traZODone (DESYREL) 25 mg TABS tablet    Sig: Take 0.5 tablets (25 mg total) by mouth at bedtime.    Dispense:  30 tablet    Refill:  3    Order Specific Question:  Supervising Provider    Answer:  Penni Bombard [[id number]]   Return in about 4  months (  around 10/16/2013).  Philmore Pali, MSN, NP-C 06/18/2013, 1:28 PM [facility] [address] [place], [place] [address] [phone]  Note: This document was prepared with digital dictation and possible smart phrase technology. Any transcriptional errors that result from this process are unintentional.

## 2013-06-18 NOTE — Patient Instructions (Addendum)
We will check lab work today for your thyroid function.  Stop Amitrityline.  Start Trazedone 1/2 tablet at bedtime instead.  Home Health PT for gait and balance training; vestibular rehab.  Follow up in 4 months.

## 2013-06-19 LAB — TSH: TSH: 1.67 u[IU]/mL (ref 0.450–4.500)

## 2013-06-21 ENCOUNTER — Other Ambulatory Visit: Payer: Self-pay | Admitting: *Deleted

## 2013-06-21 DIAGNOSIS — R42 Dizziness and giddiness: Secondary | ICD-10-CM

## 2013-06-21 DIAGNOSIS — R269 Unspecified abnormalities of gait and mobility: Secondary | ICD-10-CM

## 2013-06-21 NOTE — Progress Notes (Signed)
Consulted Charlott Holler, NP re: ok for [facility] to go our and evaluate for safety, medication and other needs.  She verbalized ok.   PT will be seeing pt on Wednesday.

## 2013-06-23 DIAGNOSIS — R42 Dizziness and giddiness: Secondary | ICD-10-CM | POA: Diagnosis not present

## 2013-06-23 DIAGNOSIS — R269 Unspecified abnormalities of gait and mobility: Secondary | ICD-10-CM | POA: Diagnosis not present

## 2013-06-24 ENCOUNTER — Other Ambulatory Visit: Payer: Self-pay | Admitting: Nurse Practitioner

## 2013-06-24 DIAGNOSIS — R269 Unspecified abnormalities of gait and mobility: Secondary | ICD-10-CM | POA: Diagnosis not present

## 2013-06-24 DIAGNOSIS — G309 Alzheimer's disease, unspecified: Secondary | ICD-10-CM

## 2013-06-24 DIAGNOSIS — F0280 Dementia in other diseases classified elsewhere without behavioral disturbance: Secondary | ICD-10-CM

## 2013-06-24 DIAGNOSIS — R42 Dizziness and giddiness: Secondary | ICD-10-CM | POA: Diagnosis not present

## 2013-06-24 DIAGNOSIS — H3530 Unspecified macular degeneration: Secondary | ICD-10-CM

## 2013-06-25 DIAGNOSIS — R42 Dizziness and giddiness: Secondary | ICD-10-CM | POA: Diagnosis not present

## 2013-06-25 DIAGNOSIS — R269 Unspecified abnormalities of gait and mobility: Secondary | ICD-10-CM | POA: Diagnosis not present

## 2013-06-28 DIAGNOSIS — R269 Unspecified abnormalities of gait and mobility: Secondary | ICD-10-CM | POA: Diagnosis not present

## 2013-06-28 DIAGNOSIS — R42 Dizziness and giddiness: Secondary | ICD-10-CM | POA: Diagnosis not present

## 2013-06-29 ENCOUNTER — Ambulatory Visit: Payer: Medicare Other | Admitting: Nurse Practitioner

## 2013-06-29 DIAGNOSIS — H33059 Total retinal detachment, unspecified eye: Secondary | ICD-10-CM | POA: Diagnosis not present

## 2013-06-30 DIAGNOSIS — R269 Unspecified abnormalities of gait and mobility: Secondary | ICD-10-CM | POA: Diagnosis not present

## 2013-06-30 DIAGNOSIS — R42 Dizziness and giddiness: Secondary | ICD-10-CM | POA: Diagnosis not present

## 2013-07-01 DIAGNOSIS — R42 Dizziness and giddiness: Secondary | ICD-10-CM | POA: Diagnosis not present

## 2013-07-01 DIAGNOSIS — R269 Unspecified abnormalities of gait and mobility: Secondary | ICD-10-CM | POA: Diagnosis not present

## 2013-07-02 ENCOUNTER — Ambulatory Visit: Payer: Medicare Other | Admitting: Nurse Practitioner

## 2013-07-07 DIAGNOSIS — R42 Dizziness and giddiness: Secondary | ICD-10-CM | POA: Diagnosis not present

## 2013-07-07 DIAGNOSIS — R269 Unspecified abnormalities of gait and mobility: Secondary | ICD-10-CM | POA: Diagnosis not present

## 2013-07-16 DIAGNOSIS — R269 Unspecified abnormalities of gait and mobility: Secondary | ICD-10-CM | POA: Diagnosis not present

## 2013-07-16 DIAGNOSIS — R42 Dizziness and giddiness: Secondary | ICD-10-CM | POA: Diagnosis not present

## 2013-07-29 ENCOUNTER — Emergency Department (HOSPITAL_COMMUNITY): Payer: Medicare Other

## 2013-07-29 ENCOUNTER — Encounter (HOSPITAL_COMMUNITY): Payer: Self-pay | Admitting: Emergency Medicine

## 2013-07-29 ENCOUNTER — Observation Stay (HOSPITAL_COMMUNITY)
Admission: EM | Admit: 2013-07-29 | Discharge: 2013-07-30 | Disposition: A | Discharge status: Home / Self Care | Payer: Medicare Other | Attending: Internal Medicine | Admitting: Internal Medicine

## 2013-07-29 DIAGNOSIS — Z87891 Personal history of nicotine dependence: Secondary | ICD-10-CM | POA: Diagnosis not present

## 2013-07-29 DIAGNOSIS — E785 Hyperlipidemia, unspecified: Secondary | ICD-10-CM

## 2013-07-29 DIAGNOSIS — Z888 Allergy status to other drugs, medicaments and biological substances status: Secondary | ICD-10-CM | POA: Diagnosis not present

## 2013-07-29 DIAGNOSIS — Z79899 Other long term (current) drug therapy: Secondary | ICD-10-CM | POA: Insufficient documentation

## 2013-07-29 DIAGNOSIS — D649 Anemia, unspecified: Secondary | ICD-10-CM | POA: Diagnosis not present

## 2013-07-29 DIAGNOSIS — Z7982 Long term (current) use of aspirin: Secondary | ICD-10-CM | POA: Diagnosis not present

## 2013-07-29 DIAGNOSIS — R109 Unspecified abdominal pain: Secondary | ICD-10-CM | POA: Diagnosis not present

## 2013-07-29 DIAGNOSIS — K449 Diaphragmatic hernia without obstruction or gangrene: Secondary | ICD-10-CM | POA: Insufficient documentation

## 2013-07-29 DIAGNOSIS — I10 Essential (primary) hypertension: Secondary | ICD-10-CM | POA: Diagnosis present

## 2013-07-29 DIAGNOSIS — R112 Nausea with vomiting, unspecified: Secondary | ICD-10-CM | POA: Diagnosis not present

## 2013-07-29 DIAGNOSIS — I129 Hypertensive chronic kidney disease with stage 1 through stage 4 chronic kidney disease, or unspecified chronic kidney disease: Secondary | ICD-10-CM | POA: Diagnosis not present

## 2013-07-29 DIAGNOSIS — N189 Chronic kidney disease, unspecified: Secondary | ICD-10-CM | POA: Diagnosis not present

## 2013-07-29 DIAGNOSIS — J441 Chronic obstructive pulmonary disease with (acute) exacerbation: Secondary | ICD-10-CM | POA: Diagnosis not present

## 2013-07-29 DIAGNOSIS — F3289 Other specified depressive episodes: Secondary | ICD-10-CM | POA: Insufficient documentation

## 2013-07-29 DIAGNOSIS — R0789 Other chest pain: Secondary | ICD-10-CM | POA: Diagnosis not present

## 2013-07-29 DIAGNOSIS — F329 Major depressive disorder, single episode, unspecified: Secondary | ICD-10-CM | POA: Insufficient documentation

## 2013-07-29 DIAGNOSIS — R0602 Shortness of breath: Secondary | ICD-10-CM | POA: Diagnosis not present

## 2013-07-29 DIAGNOSIS — R079 Chest pain, unspecified: Secondary | ICD-10-CM

## 2013-07-29 DIAGNOSIS — E7800 Pure hypercholesterolemia, unspecified: Secondary | ICD-10-CM | POA: Diagnosis present

## 2013-07-29 DIAGNOSIS — K219 Gastro-esophageal reflux disease without esophagitis: Secondary | ICD-10-CM | POA: Diagnosis not present

## 2013-07-29 HISTORY — DX: Cerebral infarction, unspecified: I63.9

## 2013-07-29 HISTORY — DX: Chest pain, unspecified: R07.9

## 2013-07-29 LAB — CBC WITH DIFFERENTIAL/PLATELET
Basophils Absolute: 0 10*3/uL (ref 0.0–0.1)
Basophils Relative: 1 % (ref 0–1)
Eosinophils Absolute: 0.2 10*3/uL (ref 0.0–0.7)
Eosinophils Relative: 4 % (ref 0–5)
HCT: 42.4 % (ref 36.0–46.0)
Hemoglobin: 14.4 g/dL (ref 12.0–15.0)
Lymphocytes Relative: 48 % — ABNORMAL HIGH (ref 12–46)
Lymphs Abs: 2.9 10*3/uL (ref 0.7–4.0)
MCH: 30.1 pg (ref 26.0–34.0)
MCHC: 34 g/dL (ref 30.0–36.0)
MCV: 88.7 fL (ref 78.0–100.0)
Monocytes Absolute: 0.7 10*3/uL (ref 0.1–1.0)
Monocytes Relative: 12 % (ref 3–12)
Neutro Abs: 2.1 10*3/uL (ref 1.7–7.7)
Neutrophils Relative %: 36 % — ABNORMAL LOW (ref 43–77)
Platelets: 169 10*3/uL (ref 150–400)
RBC: 4.78 MIL/uL (ref 3.87–5.11)
RDW: 13.7 % (ref 11.5–15.5)
WBC: 6 10*3/uL (ref 4.0–10.5)

## 2013-07-29 LAB — BASIC METABOLIC PANEL
BUN: 10 mg/dL (ref 6–23)
CO2: 25 mEq/L (ref 19–32)
Calcium: 9.4 mg/dL (ref 8.4–10.5)
Chloride: 102 mEq/L (ref 96–112)
Creatinine, Ser: 1.24 mg/dL — ABNORMAL HIGH (ref 0.50–1.10)
GFR calc Af Amer: 49 mL/min — ABNORMAL LOW (ref >90)
GFR calc non Af Amer: 43 mL/min — ABNORMAL LOW (ref >90)
Glucose, Bld: 89 mg/dL (ref 70–99)
Potassium: 3.3 mEq/L — ABNORMAL LOW (ref 3.7–5.3)
Sodium: 141 mEq/L (ref 137–147)

## 2013-07-29 LAB — I-STAT TROPONIN, ED: Troponin i, poc: 0 ng/mL (ref 0.00–0.08)

## 2013-07-29 LAB — PRO B NATRIURETIC PEPTIDE: Pro B Natriuretic peptide (BNP): 124.5 pg/mL (ref 0–125)

## 2013-07-29 MED ORDER — NITROGLYCERIN 0.4 MG SL SUBL
0.4000 mg | SUBLINGUAL_TABLET | Freq: Once | SUBLINGUAL | Status: AC
  Administered 2013-07-29: 0.4 mg via SUBLINGUAL
  Filled 2013-07-29: qty 1

## 2013-07-29 MED ORDER — ASPIRIN 81 MG PO CHEW
162.0000 mg | CHEWABLE_TABLET | Freq: Once | ORAL | Status: AC
Start: 2013-07-29 — End: 2013-07-29
  Administered 2013-07-29: 162 mg via ORAL
  Filled 2013-07-29: qty 2

## 2013-07-29 NOTE — ED Provider Notes (Signed)
73 year old female with multiple cardiac risk factors has been having tightness in her chest since about 10 AM. Tetanus had been present earlier in the morning but resolved and then recurred at 10 AM and has been constant since then. There's been associated dyspnea and nausea and vomiting. However, she states that she vomits frequently and does not think she is vomited anymore today than she normally does. She's not had any diaphoresis today but did have diaphoresis yesterday. She took aspirin 162 mg at home before coming to the ED. On exam, she is resting comfortably and in no acute distress. Lungs are clear and heart has regular rate and rhythm. His no chest wall tenderness. There is no cyanosis or edema. She will clearly need admission for serial cardiac markers and some kind of provocative stress testing.  I saw and evaluated the patient, reviewed the resident's note and I agree with the findings and plan.   EKG Interpretation   Date/Time:  Thursday July 29 2013 19:38:05 EST Ventricular Rate:  71 PR Interval:  205 QRS Duration: 88 QT Interval:  418 QTC Calculation: 878 R Axis:   28 Text Interpretation:  Sinus rhythm Normal ECG When compared with ECG of  10/16/2012, No significant change was found Confirmed by [facility]  MD, Jerzy Roepke  ([id number]) on 07/29/2013 8:17:31 PM        Delora Fuel, MD 09/47/09 [id number]

## 2013-07-29 NOTE — ED Notes (Signed)
Pt denies any chest pain at this time.  Pt resting and talking with daughter.

## 2013-07-29 NOTE — ED Notes (Addendum)
Pt c/o pain in epigastric area and lower chest.  Onset this am.  St's pain subsided after taking ASA then pain returned.  St's pain increases with deep breathing and movement

## 2013-07-29 NOTE — ED Provider Notes (Signed)
CSN: [id number]     Arrival date & time 07/29/13  1922 History   First MD Initiated Contact with Patient 07/29/13 2119     Chief Complaint  Patient presents with  . Chest Pain   HPI Comments: 73 yo F hx of HTN, HLD, 30 pack/year smoking hx, obesity, COPD, GERD, Hiatal hernia presents with CC of chest pain.  Pt states she was shopping yesterday at the grocery store, when had sudden onset diaphoresis, and daughter states she looked pale, "out of it".  She rested and this resolved within minutes.  This morning around 9 AM she had sudden onset chest pain at rest.  Described as central, chest tightness/pressure sensation, with some epigastric discomfort as well. She has had slight increased SOB with this.  She has chronic nausea, vomiting from hiatal hernia, and this has not changed in the last few days.  Denies fever, chills, cough, diarrhea, myalgias, rash, or any other symptoms.  Pt states pain has been constant, worsening throughout the day.  Denies any exacerbating factors.  She took two baby ASA with only mild relief.  She denies personal hx of CAD, but does have HTN, HLD, obesity, tobacco use, and FMHx of CAD.  She states symptoms do not feel like her COPD, hiatal hernia, or GERD.  No other complaints.   The history is provided by the patient. No language interpreter was used.    Past Medical History  Diagnosis Date  . Hiatal hernia   . Hypertension   . Anemia   . GERD (gastroesophageal reflux disease)   . Hyperlipidemia   . CKD (chronic kidney disease)   . COPD (chronic obstructive pulmonary disease)   . Depression    Past Surgical History  Procedure Laterality Date  . Abdominal surgery    . Colon surgery    . Joint replacement    . Cholecystectomy    . Abdominal hysterectomy    . Appendectomy    . Bladder surgery    . Rectocele repair     Family History  Problem Relation Age of Onset  . Heart failure Mother   . Heart attack Father    History  Substance Use Topics  . Smoking  status: Former Smoker -- 1.50 packs/day for 30 years    Types: Cigarettes    Quit date: 11/27/2012  . Smokeless tobacco: Never Used     Comment: e cig  . Alcohol Use: No   OB History   Grav Para Term Preterm Abortions TAB SAB Ect Mult Living                 Review of Systems  Constitutional: Negative for fever and chills.  Respiratory: Positive for shortness of breath. Negative for cough, wheezing and stridor.   Cardiovascular: Positive for chest pain. Negative for palpitations and leg swelling.  Gastrointestinal: Positive for nausea, vomiting and abdominal pain. Negative for diarrhea.  Musculoskeletal: Negative for myalgias.  Skin: Negative for rash.  Neurological: Negative for dizziness, weakness, light-headedness, numbness and headaches.  Hematological: Negative for adenopathy. Does not bruise/bleed easily.  All other systems reviewed and are negative.      Allergies  Ambien; Ampicillin-sulbactam sodium; Oxycontin; Toradol; and Tramadol  Home Medications   Current Outpatient Rx  Name  Route  Sig  Dispense  Refill  . albuterol (ACCUNEB) 1.25 MG/3ML nebulizer solution   Nebulization   Take 1 ampule by nebulization every 6 (six) hours as needed for wheezing.         Marland Kitchen  albuterol (PROVENTIL HFA;VENTOLIN HFA) 108 (90 BASE) MCG/ACT inhaler   Inhalation   Inhale 2 puffs into the lungs every 6 (six) hours as needed for wheezing.         Marland Kitchen ALPRAZolam (XANAX) 1 MG tablet   Oral   Take 1 mg by mouth at bedtime.         Marland Kitchen amLODipine (NORVASC) 5 MG tablet   Oral   Take 1 tablet by mouth daily.         Marland Kitchen aspirin 81 MG tablet   Oral   Take 81 mg by mouth daily.         . citalopram (CELEXA) 40 MG tablet   Oral   Take 40 mg by mouth daily.         Marland Kitchen esomeprazole (NEXIUM) 40 MG capsule   Oral   Take 40 mg by mouth 2 (two) times daily.         Marland Kitchen HYDROcodone-acetaminophen (VICODIN) 5-500 MG per tablet   Oral   Take 1 tablet by mouth every 6 (six) hours as  needed. For pain         . ibuprofen (ADVIL,MOTRIN) 200 MG tablet   Oral   Take 600 mg by mouth daily as needed for pain.         Marland Kitchen lovastatin (MEVACOR) 10 MG tablet   Oral   Take 10 mg by mouth daily with lunch.         . meclizine (ANTIVERT) 12.5 MG tablet   Oral   Take 12.5 mg by mouth 3 (three) times daily as needed for nausea.         . ondansetron (ZOFRAN) 4 MG tablet   Oral   Take 1 tablet (4 mg total) by mouth every 6 (six) hours.   18 tablet   0   . polyethylene glycol powder (GLYCOLAX/MIRALAX) powder   Oral   Take 17 g by mouth daily. For constipation         . promethazine (PHENERGAN) 25 MG suppository   Rectal   Place 25 mg rectally every 6 (six) hours as needed for nausea or vomiting.         . senna (SENOKOT) 8.6 MG tablet   Oral   Take 1 tablet by mouth daily.         . Soft Lens Products (RA REWETTING DROPS) SOLN   Does not apply   1 application by Does not apply route daily.         . traZODone (DESYREL) 25 mg TABS tablet   Oral   Take 0.5 tablets (25 mg total) by mouth at bedtime.   30 tablet   3   . vitamin B-12 (CYANOCOBALAMIN) 1000 MCG tablet   Oral   Take 1,000 mcg by mouth daily.          BP 132/75  Pulse 66  Temp(Src) 98.3 F (36.8 C) (Oral)  Resp 20  Ht 5\' 5"  (1.651 m)  Wt 200 lb (90.719 kg)  BMI 33.28 kg/m2  SpO2 96% Physical Exam  Nursing note and vitals reviewed. Constitutional: She is oriented to person, place, and time. She appears well-developed and well-nourished.  HENT:  Head: Normocephalic and atraumatic.  Right Ear: External ear normal.  Left Ear: External ear normal.  Nose: Nose normal.  Mouth/Throat: Oropharynx is clear and moist.  Eyes: Conjunctivae and EOM are normal. Pupils are equal, round, and reactive to light.  Neck: Normal range of motion. Neck  supple.  Cardiovascular: Normal rate, regular rhythm and intact distal pulses.  Exam reveals no gallop and no friction rub.   No murmur  heard. Pulmonary/Chest: Effort normal. No respiratory distress. She has no wheezes. She has no rales. She exhibits tenderness.  Mild xiphoid TTP.  Lungs sounds diminished bilaterally.  Otherwise clear, no wheezes, rhonchi, or rales.   Abdominal: Soft. Bowel sounds are normal. She exhibits no distension and no mass. There is tenderness. There is no rebound and no guarding.  Mild epigastric TTP. Soft, nondistended, no guarding, no rebound.   Musculoskeletal: Normal range of motion.  Neurological: She is alert and oriented to person, place, and time.  Skin: Skin is warm and dry.    ED Course  Procedures (including critical care time) Labs Review Labs Reviewed  CBC WITH DIFFERENTIAL - Abnormal; Notable for the following:    Neutrophils Relative % 36 (*)    Lymphocytes Relative 48 (*)    All other components within normal limits  BASIC METABOLIC PANEL - Abnormal; Notable for the following:    Potassium 3.3 (*)    Creatinine, Ser 1.24 (*)    GFR calc non Af Amer 43 (*)    GFR calc Af Amer 49 (*)    All other components within normal limits  PRO B NATRIURETIC PEPTIDE  I-STAT TROPOININ, ED   Imaging Review Dg Chest 2 View  07/29/2013   CLINICAL DATA:  Sternal chest pain and shortness of breath  EXAM: CHEST  2 VIEW  COMPARISON:  Prior radiograph from 11/19/2012  FINDINGS: Mild cardiomegaly is stable as compared to prior study. Tortuosity of the intrathoracic aorta is noted.  Lungs are normally inflated. Diffuse chronic bronchitic changes are similar to prior. No focal infiltrate, pulmonary edema, or pleural effusion. Scattered calcified granulomas overlying the upper lobes bilaterally are unchanged. No pneumothorax.  No acute osseous abnormality.  IMPRESSION: Chronic bronchitic changes/ COPD. No acute cardiopulmonary abnormality identified.   Electronically Signed   By: Jeannine Boga M.D.   On: 07/29/2013 22:54     EKG Interpretation   Date/Time:  Thursday July 29 2013 19:38:05  EST Ventricular Rate:  71 PR Interval:  205 QRS Duration: 88 QT Interval:  418 QTC Calculation: 454 R Axis:   28 Text Interpretation:  Sinus rhythm Normal ECG When compared with ECG of  10/16/2012, No significant change was found Confirmed by [facility]  MD, DAVID  ([id number]) on 07/29/2013 8:17:31 PM      MDM   Final diagnoses:  None   73 yo F hx of HTN, HLD, 30 pack/year smoking hx, obesity, COPD, GERD, Hiatal hernia presents with CC of chest pain  Filed Vitals:   07/29/13 2045  BP: 132/75  Pulse: 66  Temp:   Resp: 20   Physical exam as above.  VS WNL. EKG as above, NSR, nonischemic.  CXR chronic bronchitic changes/COPD, no acute disease identified.  Troponin, BNP, CBC diff, all WNL.  Cr chronically elevated 2/2 CKD, similar to baseline.  Pt not hypoxic, no increased cough, no wheezing, unlikely COPD exacerbation.  I have considered aortic dissection, pericarditis, myocarditis, but believe pt to be of low risk for these, and not consistent with my exam, EKG, or imaging.    Pt given ASA 164 mg.  Pt's pain has now resolved with rest, and SL NG held at this time.    HEART score 5.  Medicine consulted for admission for chest pain r/o.  Pt understands and agrees with plan.  Pt's care  plan discussed with Dr. Roxanne Mins.  Sinda Du, MD      Sinda Du, MD 07/30/13 [phone]

## 2013-07-30 ENCOUNTER — Observation Stay (HOSPITAL_COMMUNITY): Payer: Medicare Other

## 2013-07-30 ENCOUNTER — Encounter (HOSPITAL_COMMUNITY): Payer: Self-pay | Admitting: Internal Medicine

## 2013-07-30 DIAGNOSIS — E785 Hyperlipidemia, unspecified: Secondary | ICD-10-CM

## 2013-07-30 DIAGNOSIS — K219 Gastro-esophageal reflux disease without esophagitis: Secondary | ICD-10-CM | POA: Diagnosis not present

## 2013-07-30 DIAGNOSIS — R079 Chest pain, unspecified: Secondary | ICD-10-CM

## 2013-07-30 DIAGNOSIS — I10 Essential (primary) hypertension: Secondary | ICD-10-CM

## 2013-07-30 DIAGNOSIS — E7800 Pure hypercholesterolemia, unspecified: Secondary | ICD-10-CM | POA: Diagnosis present

## 2013-07-30 DIAGNOSIS — R0789 Other chest pain: Secondary | ICD-10-CM | POA: Diagnosis not present

## 2013-07-30 LAB — CBC WITH DIFFERENTIAL/PLATELET
Basophils Absolute: 0 10*3/uL (ref 0.0–0.1)
Basophils Relative: 0 % (ref 0–1)
Eosinophils Absolute: 0.3 10*3/uL (ref 0.0–0.7)
Eosinophils Relative: 5 % (ref 0–5)
HCT: 41.2 % (ref 36.0–46.0)
Hemoglobin: 13.9 g/dL (ref 12.0–15.0)
Lymphocytes Relative: 43 % (ref 12–46)
Lymphs Abs: 2.2 10*3/uL (ref 0.7–4.0)
MCH: 29.8 pg (ref 26.0–34.0)
MCHC: 33.7 g/dL (ref 30.0–36.0)
MCV: 88.4 fL (ref 78.0–100.0)
Monocytes Absolute: 0.7 10*3/uL (ref 0.1–1.0)
Monocytes Relative: 13 % — ABNORMAL HIGH (ref 3–12)
Neutro Abs: 2 10*3/uL (ref 1.7–7.7)
Neutrophils Relative %: 39 % — ABNORMAL LOW (ref 43–77)
Platelets: 165 10*3/uL (ref 150–400)
RBC: 4.66 MIL/uL (ref 3.87–5.11)
RDW: 13.7 % (ref 11.5–15.5)
WBC: 5.1 10*3/uL (ref 4.0–10.5)

## 2013-07-30 LAB — BASIC METABOLIC PANEL
BUN: 10 mg/dL (ref 6–23)
CO2: 27 mEq/L (ref 19–32)
Calcium: 9.2 mg/dL (ref 8.4–10.5)
Chloride: 105 mEq/L (ref 96–112)
Creatinine, Ser: 1.17 mg/dL — ABNORMAL HIGH (ref 0.50–1.10)
GFR calc Af Amer: 53 mL/min — ABNORMAL LOW (ref >90)
GFR calc non Af Amer: 46 mL/min — ABNORMAL LOW (ref >90)
Glucose, Bld: 86 mg/dL (ref 70–99)
Potassium: 3.1 mEq/L — ABNORMAL LOW (ref 3.7–5.3)
Sodium: 145 mEq/L (ref 137–147)

## 2013-07-30 LAB — TROPONIN I
Troponin I: <0.3 ng/mL (ref <0.30)
Troponin I: <0.3 ng/mL (ref <0.30)
Troponin I: <0.3 ng/mL (ref <0.30)

## 2013-07-30 LAB — TSH: TSH: 1.351 u[IU]/mL (ref 0.350–4.500)

## 2013-07-30 MED ORDER — ALBUTEROL SULFATE (2.5 MG/3ML) 0.083% IN NEBU: 2.5000 mg | INHALATION_SOLUTION | Freq: Four times a day (QID) | RESPIRATORY_TRACT | Status: DC | PRN

## 2013-07-30 MED ORDER — TECHNETIUM TC 99M SESTAMIBI - CARDIOLITE
10.0000 | Freq: Once | INTRAVENOUS | Status: AC | PRN
  Administered 2013-07-30: 10 via INTRAVENOUS

## 2013-07-30 MED ORDER — ONDANSETRON HCL 4 MG PO TABS: 4.0000 mg | ORAL_TABLET | Freq: Four times a day (QID) | ORAL | Status: DC | PRN

## 2013-07-30 MED ORDER — CITALOPRAM HYDROBROMIDE 40 MG PO TABS
40.0000 mg | ORAL_TABLET | Freq: Every day | ORAL | Status: DC
  Administered 2013-07-30: 40 mg via ORAL
  Filled 2013-07-30: qty 1

## 2013-07-30 MED ORDER — PANTOPRAZOLE SODIUM 40 MG PO TBEC
40.0000 mg | DELAYED_RELEASE_TABLET | Freq: Two times a day (BID) | ORAL | Status: DC
  Administered 2013-07-30: 40 mg via ORAL
  Filled 2013-07-30: qty 1

## 2013-07-30 MED ORDER — PANTOPRAZOLE SODIUM 40 MG PO TBEC: 40.0000 mg | DELAYED_RELEASE_TABLET | Freq: Every day | ORAL | Status: DC

## 2013-07-30 MED ORDER — SENNA 8.6 MG PO TABS
1.0000 | ORAL_TABLET | Freq: Every day | ORAL | Status: DC
  Administered 2013-07-30: 8.6 mg via ORAL
  Filled 2013-07-30: qty 1

## 2013-07-30 MED ORDER — AMLODIPINE BESYLATE 5 MG PO TABS
5.0000 mg | ORAL_TABLET | Freq: Every day | ORAL | Status: DC
  Administered 2013-07-30: 5 mg via ORAL
  Filled 2013-07-30: qty 1

## 2013-07-30 MED ORDER — TRAZODONE 25 MG HALF TABLET
25.0000 mg | ORAL_TABLET | Freq: Every day | ORAL | Status: DC
  Administered 2013-07-30: 25 mg via ORAL
  Filled 2013-07-30 (×2): qty 1

## 2013-07-30 MED ORDER — TECHNETIUM TC 99M SESTAMIBI GENERIC - CARDIOLITE
30.0000 | Freq: Once | INTRAVENOUS | Status: AC | PRN
  Administered 2013-07-30: 30 via INTRAVENOUS

## 2013-07-30 MED ORDER — PROMETHAZINE HCL 25 MG RE SUPP: 25.0000 mg | Freq: Four times a day (QID) | RECTAL | Status: DC | PRN

## 2013-07-30 MED ORDER — SIMVASTATIN 5 MG PO TABS
5.0000 mg | ORAL_TABLET | Freq: Every day | ORAL | Status: DC
  Filled 2013-07-30: qty 1

## 2013-07-30 MED ORDER — ASPIRIN EC 325 MG PO TBEC
325.0000 mg | DELAYED_RELEASE_TABLET | Freq: Every day | ORAL | Status: DC
  Administered 2013-07-30: 325 mg via ORAL
  Filled 2013-07-30: qty 1

## 2013-07-30 MED ORDER — ALPRAZOLAM 0.5 MG PO TABS
1.0000 mg | ORAL_TABLET | Freq: Every day | ORAL | Status: DC
  Administered 2013-07-30: 1 mg via ORAL
  Filled 2013-07-30: qty 2

## 2013-07-30 MED ORDER — POLYETHYLENE GLYCOL 3350 17 GM/SCOOP PO POWD: 17.0000 g | Freq: Every day | ORAL | Status: DC

## 2013-07-30 MED ORDER — POLYETHYLENE GLYCOL 3350 17 G PO PACK
17.0000 g | PACK | Freq: Every day | ORAL | Status: DC
  Filled 2013-07-30: qty 1

## 2013-07-30 MED ORDER — VITAMIN B-12 1000 MCG PO TABS
1000.0000 ug | ORAL_TABLET | Freq: Every day | ORAL | Status: DC
  Administered 2013-07-30: 1000 ug via ORAL
  Filled 2013-07-30: qty 1

## 2013-07-30 MED ORDER — REGADENOSON 0.4 MG/5ML IV SOLN
0.4000 mg | Freq: Once | INTRAVENOUS | Status: AC
  Administered 2013-07-30: 0.4 mg via INTRAVENOUS

## 2013-07-30 MED ORDER — POTASSIUM CHLORIDE CRYS ER 20 MEQ PO TBCR
20.0000 meq | EXTENDED_RELEASE_TABLET | Freq: Once | ORAL | Status: AC
  Administered 2013-07-30: 20 meq via ORAL
  Filled 2013-07-30: qty 1

## 2013-07-30 MED ORDER — MECLIZINE HCL 12.5 MG PO TABS
12.5000 mg | ORAL_TABLET | Freq: Three times a day (TID) | ORAL | Status: DC | PRN
  Filled 2013-07-30: qty 1

## 2013-07-30 MED ORDER — MORPHINE SULFATE 2 MG/ML IJ SOLN: 2.0000 mg | INTRAMUSCULAR | Status: DC | PRN

## 2013-07-30 MED ORDER — REGADENOSON 0.4 MG/5ML IV SOLN
INTRAVENOUS | Status: AC
  Administered 2013-07-30: 11:00:00
  Filled 2013-07-30: qty 5

## 2013-07-30 MED ORDER — HYDROCODONE-ACETAMINOPHEN 5-325 MG PO TABS
1.0000 | ORAL_TABLET | Freq: Four times a day (QID) | ORAL | Status: DC | PRN
  Administered 2013-07-30: 1 via ORAL
  Filled 2013-07-30: qty 1

## 2013-07-30 MED ORDER — NITROGLYCERIN 0.4 MG SL SUBL: 0.4000 mg | SUBLINGUAL_TABLET | SUBLINGUAL | Status: DC | PRN

## 2013-07-30 MED ORDER — ENOXAPARIN SODIUM 40 MG/0.4ML ~~LOC~~ SOLN
40.0000 mg | Freq: Every day | SUBCUTANEOUS | Status: DC
  Administered 2013-07-30: 40 mg via SUBCUTANEOUS
  Filled 2013-07-30: qty 0.4

## 2013-07-30 MED ORDER — GI COCKTAIL ~~LOC~~
30.0000 mL | Freq: Two times a day (BID) | ORAL | Status: DC | PRN
  Administered 2013-07-30: 30 mL via ORAL
  Filled 2013-07-30: qty 30

## 2013-07-30 MED ORDER — ALBUTEROL SULFATE HFA 108 (90 BASE) MCG/ACT IN AERS: 2.0000 | INHALATION_SPRAY | Freq: Four times a day (QID) | RESPIRATORY_TRACT | Status: DC | PRN

## 2013-07-30 NOTE — Progress Notes (Signed)
Pts assessment unchanged from this am. D/c'd via wheelchair to private vehicle in stable condition

## 2013-07-30 NOTE — H&P (Signed)
[facility] History and Physical  [name]:[id number] DOB: [birth year] DOA: 07/29/2013  Referring physician: ER physician. PCP: Georgann Housekeeper, MD   Chief Complaint: Chest pain.  HPI: [name] is a 73 y.o. female history of COPD, hypertension, hyperlipidemia, previous history of tobacco abuse presents to the ER because of chest pain. Patient while shopping 2 days ago started developing some diaphoresis. Yesterday morning patient developed chest pain retrosternal heaviness nonradiating increased on exertion for a few minutes which kept recurring. Also has mild shortness of breath. Since patient's symptoms are recurring patient came to the ER. Presently chest pain-free. Cardiac markers and EKG are unremarkable. Patient has been admitted for further management. Denies any nausea vomiting abdominal pain diarrhea headache focal deficits.   Review of Systems: As presented in the history of presenting illness, rest negative.  Past Medical History  Diagnosis Date  . Hiatal hernia   . Hypertension   . Anemia   . GERD (gastroesophageal reflux disease)   . Hyperlipidemia   . CKD (chronic kidney disease)   . COPD (chronic obstructive pulmonary disease)   . Depression    Past Surgical History  Procedure Laterality Date  . Abdominal surgery    . Colon surgery    . Joint replacement    . Cholecystectomy    . Abdominal hysterectomy    . Appendectomy    . Bladder surgery    . Rectocele repair     Social History:  reports that she quit smoking about 8 months ago. Her smoking use included Cigarettes. She has a 45 pack-year smoking history. She has never used smokeless tobacco. She reports that she does not drink alcohol or use illicit drugs. Where does patient live home. Can patient participate in ADLs? Yes.  Allergies  Allergen Reactions  . Ambien [Zolpidem Tartrate]   . Ampicillin-Sulbactam Sodium Other (See Comments)    Muscles draw up tight - severe headache  .  Oxycontin [Oxycodone Hcl]   . Toradol [Ketorolac Tromethamine]   . Tramadol     Family History:  Family History  Problem Relation Age of Onset  . Heart failure Mother   . Heart attack Father       Prior to Admission medications   Medication Sig Start Date End Date Taking? Authorizing Provider  albuterol (ACCUNEB) 1.25 MG/3ML nebulizer solution Take 1 ampule by nebulization every 6 (six) hours as needed for wheezing.   Yes Historical Provider, MD  albuterol (PROVENTIL HFA;VENTOLIN HFA) 108 (90 BASE) MCG/ACT inhaler Inhale 2 puffs into the lungs every 6 (six) hours as needed for wheezing.   Yes Historical Provider, MD  ALPRAZolam Prudy Feeler) 1 MG tablet Take 1 mg by mouth at bedtime.   Yes Historical Provider, MD  amLODipine (NORVASC) 5 MG tablet Take 1 tablet by mouth daily. 04/06/13  Yes Historical Provider, MD  aspirin 81 MG tablet Take 81 mg by mouth daily.   Yes Historical Provider, MD  citalopram (CELEXA) 40 MG tablet Take 40 mg by mouth daily.   Yes Historical Provider, MD  esomeprazole (NEXIUM) 40 MG capsule Take 40 mg by mouth 2 (two) times daily.   Yes Historical Provider, MD  HYDROcodone-acetaminophen (VICODIN) 5-500 MG per tablet Take 1 tablet by mouth every 6 (six) hours as needed. For pain   Yes Historical Provider, MD  ibuprofen (ADVIL,MOTRIN) 200 MG tablet Take 600 mg by mouth daily as needed for pain.   Yes Historical Provider, MD  lovastatin (MEVACOR) 10 MG tablet Take 10 mg by  mouth daily with lunch.   Yes Historical Provider, MD  meclizine (ANTIVERT) 12.5 MG tablet Take 12.5 mg by mouth 3 (three) times daily as needed for nausea.   Yes Historical Provider, MD  ondansetron (ZOFRAN) 4 MG tablet Take 1 tablet (4 mg total) by mouth every 6 (six) hours. 10/17/12  Yes Brandt Loosen, MD  polyethylene glycol powder (GLYCOLAX/MIRALAX) powder Take 17 g by mouth daily. For constipation   Yes Historical Provider, MD  promethazine (PHENERGAN) 25 MG suppository Place 25 mg rectally every 6  (six) hours as needed for nausea or vomiting.   Yes Historical Provider, MD  senna (SENOKOT) 8.6 MG tablet Take 1 tablet by mouth daily.   Yes Historical Provider, MD  Soft Lens Products (RA REWETTING DROPS) SOLN 1 application by Does not apply route daily.   Yes Historical Provider, MD  traZODone (DESYREL) 25 mg TABS tablet Take 0.5 tablets (25 mg total) by mouth at bedtime. 06/18/13  Yes Ronal Fear, NP  vitamin B-12 (CYANOCOBALAMIN) 1000 MCG tablet Take 1,000 mcg by mouth daily.   Yes Historical Provider, MD    Physical Exam: Filed Vitals:   07/29/13 2247 07/29/13 2306 07/30/13 0016 07/30/13 0033  BP: 97/46 138/70 130/74 131/65  Pulse: 57 68 76 58  Temp:  98 F (36.7 C)  97.8 F (36.6 C)  TempSrc:  Oral  Oral  Resp: 20 14 14 16   Height:    5\' 5"  (1.651 m)  Weight:    88.905 kg (196 lb)  SpO2: 99% 95% 98% 92%     General:  Well-developed and nourished.  Eyes: Anicteric no pallor.  ENT: No discharge from the ears eyes nose mouth.  Neck: No mass felt.  Cardiovascular: S1-S2 heard.  Respiratory: No rhonchi or crepitations.  Abdomen: Soft nontender bowel sounds present. No guarding or rigidity.  Skin: No rash.  Musculoskeletal: No edema.  Psychiatric: Appears normal.  Neurologic: Alert awake oriented to time place and person. Moves all extremities.  Labs on Admission:  Basic Metabolic Panel:  Recent Labs Lab 07/29/13 2148  NA 141  K 3.3*  CL 102  CO2 25  GLUCOSE 89  BUN 10  CREATININE 1.24*  CALCIUM 9.4   Liver Function Tests: No results found for this basename: AST, ALT, ALKPHOS, BILITOT, PROT, ALBUMIN,  in the last 168 hours No results found for this basename: LIPASE, AMYLASE,  in the last 168 hours No results found for this basename: AMMONIA,  in the last 168 hours CBC:  Recent Labs Lab 07/29/13 2148  WBC 6.0  NEUTROABS 2.1  HGB 14.4  HCT 42.4  MCV 88.7  PLT 169   Cardiac Enzymes: No results found for this basename: CKTOTAL, CKMB, CKMBINDEX,  TROPONINI,  in the last 168 hours  BNP (last 3 results)  Recent Labs  07/29/13 2149  PROBNP 124.5   CBG: No results found for this basename: GLUCAP,  in the last 168 hours  Radiological Exams on Admission: Dg Chest 2 View  07/29/2013   CLINICAL DATA:  Sternal chest pain and shortness of breath  EXAM: CHEST  2 VIEW  COMPARISON:  Prior radiograph from 11/19/2012  FINDINGS: Mild cardiomegaly is stable as compared to prior study. Tortuosity of the intrathoracic aorta is noted.  Lungs are normally inflated. Diffuse chronic bronchitic changes are similar to prior. No focal infiltrate, pulmonary edema, or pleural effusion. Scattered calcified granulomas overlying the upper lobes bilaterally are unchanged. No pneumothorax.  No acute osseous abnormality.  IMPRESSION: Chronic  bronchitic changes/ COPD. No acute cardiopulmonary abnormality identified.   Electronically Signed   By: Rise Mu M.D.   On: 07/29/2013 22:54    EKG: Independently reviewed. Normal sinus rhythm with Q waves in lead 3.  Assessment/Plan Principal Problem:   Chest pain Active Problems:   HLD (hyperlipidemia)   HTN (hypertension)   1. Chest pain - given patient's risk factors including hypertension hyperlipidemia and previous history of cigarette smoking and family history at this time we'll cycle cardiac markers to rule out ACS. Aspirin and when necessary nitroglycerin. Check 2-D echo. I have placed patient as 4 AM in anticipation of possible cardiac procedures. May consult cardiology in a.m. 2. Hypertension - continue present medications. 3. Hyperlipidemia - continue present medications. 4. History of COPD - presently not wheezing.    Code Status: Full code.  Family Communication: Patient's daughter at the bedside.  Disposition Plan: Admit under Dr. Eula Listen for observation.    [name] N. [facility] Pager [phone].  If 7PM-7AM, please contact night-coverage [email or web address] Password  [email or web address] 07/30/2013, 12:40 AM

## 2013-07-30 NOTE — Discharge Summary (Signed)
Physician Discharge Summary  Patient ID: [name] MRN: [id number] DOB/AGE: 72-May-1942 72 y.o.  Admit date: 07/29/2013 Discharge date: 07/30/2013  Admission Diagnoses:  Discharge Diagnoses:  Principal Problem:   Chest pain- r/o- Stress nuclear negative. hypokalemia Active Problems:   HLD (hyperlipidemia)   HTN (hypertension)   GERD (gastroesophageal reflux disease)   Discharged Condition: good  Hospital Course: 28 with episode of CP CP: pt admit to telemetry CXR negative. EKG normal Telemetry- NSR. Cardiac markers- negative.  cardiology consult- Nuclear stress test done- negative for Ischemia; normal EF. DDX GERD, esophageal irritation. Continue PPI HTN: BP controlled Hypokalemia: replace K; recheck out pt again. Depression: stable COPD: stable F/u outpatient in 1-2 weeks   Consults: cardiology  Significant Diagnostic Studies: radiology: CXR: normal  Treatments: Aspirin  Discharge Exam: Blood pressure 131/75, pulse 62, temperature 98 F (36.7 C), temperature source Oral, resp. rate 20, height 5\' 5"  (1.651 m), weight 88.905 kg (196 lb), SpO2 96.00%. General appearance: alert Resp: clear to auscultation bilaterally Cardio: regular rate and rhythm GI: soft, non-tender; bowel sounds normal; no masses,  no organomegaly  Disposition: 01-Home or Self Care  Discharge Orders   Future Appointments Provider Department Dept Phone   10/19/2013 10:30 AM Philmore Pali, NP [facility] [phone]   Future Orders Complete By Expires   Diet - low sodium heart healthy  As directed    Increase activity slowly  As directed        Medication List         albuterol 108 (90 BASE) MCG/ACT inhaler  Commonly known as:  PROVENTIL HFA;VENTOLIN HFA  Inhale 2 puffs into the lungs every 6 (six) hours as needed for wheezing.     albuterol 1.25 MG/3ML nebulizer solution  Commonly known as:  ACCUNEB  Take 1 ampule by nebulization every 6 (six) hours as needed for  wheezing.     ALPRAZolam 1 MG tablet  Commonly known as:  XANAX  Take 1 mg by mouth at bedtime.     amLODipine 5 MG tablet  Commonly known as:  NORVASC  Take 1 tablet by mouth daily.     aspirin 81 MG tablet  Take 81 mg by mouth daily.     citalopram 40 MG tablet  Commonly known as:  CELEXA  Take 40 mg by mouth daily.     esomeprazole 40 MG capsule  Commonly known as:  NEXIUM  Take 40 mg by mouth 2 (two) times daily.     HYDROcodone-acetaminophen 5-500 MG per tablet  Commonly known as:  VICODIN  Take 1 tablet by mouth every 6 (six) hours as needed. For pain     ibuprofen 200 MG tablet  Commonly known as:  ADVIL,MOTRIN  Take 600 mg by mouth daily as needed for pain.     lovastatin 10 MG tablet  Commonly known as:  MEVACOR  Take 10 mg by mouth daily with lunch.     meclizine 12.5 MG tablet  Commonly known as:  ANTIVERT  Take 12.5 mg by mouth 3 (three) times daily as needed for nausea.     ondansetron 4 MG tablet  Commonly known as:  ZOFRAN  Take 1 tablet (4 mg total) by mouth every 6 (six) hours.     polyethylene glycol powder powder  Commonly known as:  GLYCOLAX/MIRALAX  Take 17 g by mouth daily. For constipation     promethazine 25 MG suppository  Commonly known as:  PHENERGAN  Place 25 mg rectally every  6 (six) hours as needed for nausea or vomiting.     RA REWETTING DROPS Soln  1 application by Does not apply route daily.     senna 8.6 MG tablet  Commonly known as:  SENOKOT  Take 1 tablet by mouth daily.     traZODone 25 mg Tabs tablet  Commonly known as:  DESYREL  Take 0.5 tablets (25 mg total) by mouth at bedtime.     vitamin B-12 1000 MCG tablet  Commonly known as:  CYANOCOBALAMIN  Take 1,000 mcg by mouth daily.           Follow-up Information   Follow up with Wenda Low, MD.   Specialty:  Internal Medicine   Contact information:   [address] [place] [address] [phone]        Signed: Wenda Low 07/30/2013, 3:08 PM

## 2013-07-30 NOTE — Progress Notes (Signed)
Agree 

## 2013-07-30 NOTE — Progress Notes (Signed)
UR completed 

## 2013-07-30 NOTE — Progress Notes (Signed)
Subjective: Episode of CP intermittent- with some diaphoasis H/o GERD/ had vomiting  Few days ago- pain in epigastic and substernal area Lab ok   Objective: Vital signs in last 24 hours: Temp:  [97.8 F (36.6 C)-98.3 F (36.8 C)] 97.8 F (36.6 C) (03/06 0033) Pulse Rate:  [57-76] 58 (03/06 0033) Resp:  [14-25] 16 (03/06 0033) BP: (97-142)/(46-82) 131/65 mmHg (03/06 0033) SpO2:  [92 %-99 %] 92 % (03/06 0033) Weight:  [88.905 kg (196 lb)-90.719 kg (200 lb)] 88.905 kg (196 lb) (03/06 0033) Weight change:     Intake/Output from previous day:   Intake/Output this shift:    General appearance: alert Resp: clear to auscultation bilaterally Chest wall: some substernal tenderness Cardio: regular rate and rhythm GI: soft, non-tender; bowel sounds normal; no masses,  no organomegaly  Lab Results:  Recent Labs  07/29/13 2148 07/30/13 0630  WBC 6.0 5.1  HGB 14.4 13.9  HCT 42.4 41.2  PLT 169 165   BMET  Recent Labs  07/29/13 2148  NA 141  K 3.3*  CL 102  CO2 25  GLUCOSE 89  BUN 10  CREATININE 1.24*  CALCIUM 9.4    Studies/Results: Dg Chest 2 View  07/29/2013   CLINICAL DATA:  Sternal chest pain and shortness of breath  EXAM: CHEST  2 VIEW  COMPARISON:  Prior radiograph from 11/19/2012  FINDINGS: Mild cardiomegaly is stable as compared to prior study. Tortuosity of the intrathoracic aorta is noted.  Lungs are normally inflated. Diffuse chronic bronchitic changes are similar to prior. No focal infiltrate, pulmonary edema, or pleural effusion. Scattered calcified granulomas overlying the upper lobes bilaterally are unchanged. No pneumothorax.  No acute osseous abnormality.  IMPRESSION: Chronic bronchitic changes/ COPD. No acute cardiopulmonary abnormality identified.   Electronically Signed   By: Jeannine Boga M.D.   On: 07/29/2013 22:54    Medications: I have reviewed the patient's current medications.  Assessment/Plan: CP: markers negative, NSR on Tele; CXR and  EKG ok Risk factors Nuclear stress test -  DDX: noncardiac. Esophageal irritation HTN: BP ok GERD: PPI Mild low K- repleat   LOS: 1 day   [name] 07/30/2013, 7:26 AM

## 2013-07-30 NOTE — Progress Notes (Signed)
Pt set up for a stress echo as part of chest pain clinic protocol. She has chronic dizziness from a prior stroke, bilat knee replacements, and COPD. I changed her to a [employer].  Kerin Ransom PA-C 07/30/2013 8:47 AM

## 2013-08-06 DIAGNOSIS — J449 Chronic obstructive pulmonary disease, unspecified: Secondary | ICD-10-CM | POA: Diagnosis not present

## 2013-08-06 DIAGNOSIS — K219 Gastro-esophageal reflux disease without esophagitis: Secondary | ICD-10-CM | POA: Diagnosis not present

## 2013-08-10 DIAGNOSIS — H353190 Nonexudative age-related macular degeneration, unspecified eye, stage unspecified: Secondary | ICD-10-CM | POA: Diagnosis not present

## 2013-08-10 DIAGNOSIS — H5210 Myopia, unspecified eye: Secondary | ICD-10-CM | POA: Diagnosis not present

## 2013-08-10 DIAGNOSIS — H2510 Age-related nuclear cataract, unspecified eye: Secondary | ICD-10-CM | POA: Diagnosis not present

## 2013-08-10 DIAGNOSIS — H15839 Staphyloma posticum, unspecified eye: Secondary | ICD-10-CM | POA: Diagnosis not present

## 2013-09-13 DIAGNOSIS — I10 Essential (primary) hypertension: Secondary | ICD-10-CM | POA: Diagnosis not present

## 2013-09-13 DIAGNOSIS — J449 Chronic obstructive pulmonary disease, unspecified: Secondary | ICD-10-CM | POA: Diagnosis not present

## 2013-09-27 ENCOUNTER — Other Ambulatory Visit (HOSPITAL_COMMUNITY): Payer: Self-pay | Admitting: Gastroenterology

## 2013-09-27 DIAGNOSIS — R112 Nausea with vomiting, unspecified: Secondary | ICD-10-CM | POA: Diagnosis not present

## 2013-09-27 DIAGNOSIS — K429 Umbilical hernia without obstruction or gangrene: Secondary | ICD-10-CM | POA: Diagnosis not present

## 2013-09-27 DIAGNOSIS — M25559 Pain in unspecified hip: Secondary | ICD-10-CM | POA: Diagnosis not present

## 2013-10-14 ENCOUNTER — Ambulatory Visit (HOSPITAL_COMMUNITY)
Admission: RE | Admit: 2013-10-14 | Discharge: 2013-10-14 | Disposition: A | Discharge status: Home / Self Care | Payer: Medicare Other | Source: Ambulatory Visit | Attending: Gastroenterology | Admitting: Gastroenterology

## 2013-10-14 DIAGNOSIS — R112 Nausea with vomiting, unspecified: Secondary | ICD-10-CM

## 2013-10-14 MED ORDER — TECHNETIUM TC 99M SULFUR COLLOID
2.0000 | Freq: Once | INTRAVENOUS | Status: AC | PRN
  Administered 2013-10-14: 2 via INTRAVENOUS

## 2013-10-19 ENCOUNTER — Ambulatory Visit (INDEPENDENT_AMBULATORY_CARE_PROVIDER_SITE_OTHER): Payer: Medicare Other | Admitting: Nurse Practitioner

## 2013-10-19 ENCOUNTER — Encounter: Payer: Self-pay | Admitting: Nurse Practitioner

## 2013-10-19 VITALS — BP 132/78 | HR 77 | Ht 65.0 in | Wt 194.0 lb

## 2013-10-19 DIAGNOSIS — R42 Dizziness and giddiness: Secondary | ICD-10-CM | POA: Diagnosis not present

## 2013-10-19 DIAGNOSIS — G629 Polyneuropathy, unspecified: Secondary | ICD-10-CM

## 2013-10-19 DIAGNOSIS — R269 Unspecified abnormalities of gait and mobility: Secondary | ICD-10-CM

## 2013-10-19 DIAGNOSIS — I951 Orthostatic hypotension: Secondary | ICD-10-CM

## 2013-10-19 DIAGNOSIS — G589 Mononeuropathy, unspecified: Secondary | ICD-10-CM | POA: Diagnosis not present

## 2013-10-19 NOTE — Progress Notes (Signed)
PATIENT: [name] DOB: [birth year]  REASON FOR VISIT: follow up for gait difficulty HISTORY FROM: patient and daughter  HISTORY OF PRESENT ILLNESS: UPDATE 10/19/13 (LL):  Since last visit, Home Health PT for gait and balance training; vestibular rehab was ordered and completed. TSH was normal.  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 No acute findings. No change from MRI on 03/30/12.  Basically her symptoms have not changed. Daughter lives with her and helps with daily ADLs.  Also helps her do vestibular exercises.  UPDATE 06/18/13 (LL): Patient returns for follow up. Daughter requests sooner revisit, patient's dizziness has not improved. She did not get labs drawn that were ordered at last visit (B12, TSH, and HgbA1C). She states her PCP checks her sugar and B12. Her dizziness is mainly brought on by quick changes in position. She states she has tried vestibular rehab exercises before and it did not help. She denies falls. She has tried Meclizine in the past with no benefit.   UPDATE 05/03/13 (VP): Patient presents as referral for recurrent dizziness, balance difficulty, passing out. Patient tells me that she is feeling swimmy headed, having spinning sensation with vertigo, lasting a few minutes at a time. This typically triggered by changing position. Sometimes this happens when she is sitting or without movement. Patient is also episodes of passing out, with loss of consciousness for a few minutes. No convulsions. She has prodromal lightheadedness this happens. No post ictal confusion. No tongue biting or incontinence with these episodes.   Also significantly noted is poor balance and gait with intermittent falling down. Her depression and anxiety are also poorly controlled at this point. She has significant hallucinations, racing thoughts, poor sleep. Patient's memory is getting worse over time. She does not remember seeing me in October  2013.   PRIOR HPI (03/13/12): 72 year old right-handed female with history of hypertension, hypercholesterolemia, depression, anxiety, here for evaluation of dizziness.  For past 6-7 units, patient has been having intermittent episodes of room spinning sensation, lasting 45 seconds at a time. No nausea vomiting. Episodes typically triggered when she turns her head too quickly, tilt her head up or down. She denies any slurred speech, trouble talking, trouble swallowing, double vision.  Patient had one episode while she was sitting down, without head turning movement, where she felt dizzy, spinning, and then passed out.   REVIEW OF SYSTEMS: Full 14 system review of systems performed and notable only for loss of nausea, vomiting, headache, dizziness.   ALLERGIES: Allergies  Allergen Reactions  . Ambien [Zolpidem Tartrate]   . Ampicillin-Sulbactam Sodium Other (See Comments)    Muscles draw up tight - severe headache  . Oxycontin [Oxycodone Hcl]   . Toradol [Ketorolac Tromethamine]   . Tramadol     HOME MEDICATIONS: Outpatient Prescriptions Prior to Visit  Medication Sig Dispense Refill  . albuterol (ACCUNEB) 1.25 MG/3ML nebulizer solution Take 1 ampule by nebulization every 6 (six) hours as needed for wheezing.      Marland Kitchen albuterol (PROVENTIL HFA;VENTOLIN HFA) 108 (90 BASE) MCG/ACT inhaler Inhale 2 puffs into the lungs every 6 (six) hours as needed for wheezing.      Marland Kitchen ALPRAZolam (XANAX) 1 MG tablet Take 1 mg by mouth at bedtime.      Marland Kitchen amLODipine (NORVASC) 5 MG tablet Take 1 tablet by mouth daily.      Marland Kitchen aspirin 81 MG tablet Take 81 mg by mouth daily.      Marland Kitchen  citalopram (CELEXA) 40 MG tablet Take 40 mg by mouth daily. Patient take a half of tab daily      . esomeprazole (NEXIUM) 40 MG capsule Take 40 mg by mouth 2 (two) times daily.      Marland Kitchen HYDROcodone-acetaminophen (VICODIN) 5-500 MG per tablet Take 1 tablet by mouth every 6 (six) hours as needed. For pain      . ibuprofen (ADVIL,MOTRIN) 200  MG tablet Take 600 mg by mouth daily as needed for pain.      Marland Kitchen lovastatin (MEVACOR) 10 MG tablet Take 10 mg by mouth daily with lunch.      . meclizine (ANTIVERT) 12.5 MG tablet Take 12.5 mg by mouth 3 (three) times daily as needed for nausea.      . ondansetron (ZOFRAN) 4 MG tablet Take 1 tablet (4 mg total) by mouth every 6 (six) hours.  18 tablet  0  . polyethylene glycol powder (GLYCOLAX/MIRALAX) powder Take 17 g by mouth daily. For constipation      . promethazine (PHENERGAN) 25 MG suppository Place 25 mg rectally every 6 (six) hours as needed for nausea or vomiting.      . senna (SENOKOT) 8.6 MG tablet Take 1 tablet by mouth daily.      . Soft Lens Products (RA REWETTING DROPS) SOLN 1 application by Does not apply route daily.      . traZODone (DESYREL) 25 mg TABS tablet Take 0.5 tablets (25 mg total) by mouth at bedtime.  30 tablet  3  . vitamin B-12 (CYANOCOBALAMIN) 1000 MCG tablet Take 1,000 mcg by mouth daily.       No facility-administered medications prior to visit.     PHYSICAL EXAM  Filed Vitals:   10/19/13 1042 10/19/13 1045 10/19/13 1047  BP: 158/85 149/81 132/78  Pulse: 75 72 77  Height: 5\' 5"  (1.651 m)    Weight: 194 lb (87.998 kg)     Body mass index is 32.28 kg/(m^2). No exam data present   Generalized: Well developed, in no acute distress, DIX HALLPIKE PRODUCES VERTIGO; CANNOT HOLD EYES OPEN.  Head: normocephalic and atraumatic. Oropharynx benign  Ears: TMs clear, canals clear  Neck: Supple, no carotid bruits  Cardiac: Regular rate rhythm, no murmur  Musculoskeletal: No deformity   Neurological examination  MENTAL STATUS: awake, alert, oriented to person, place and time, recent and remote memory intact, normal attention and concentration, language fluent, comprehension intact, naming intact, fund of knowledge appropriate.  CRANIAL NERVE: pupils equal and reactive to light, visual fields full to confrontation, extraocular muscles intact, no nystagmus, facial  sensation and strength symmetric, hearing intact, palate elevates symmetrically, uvula midline, shoulder shrug symmetric, tongue midline.  MOTOR: normal bulk and tone, full strength in the BUE, BLE  SENSORY: normal and symmetric to light touc  COORDINATION: finger-nose-finger --> DYSMETRIA  REFLEXES: deep tendon reflexes present and symmetric  GAIT/STATION:  Gait is wide based, tandem in not possible. Romberg is positive. She is able to toe and heel walk. She uses no assistive device.  03/30/12 MRI BRAIN - 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 There mild changes of chronic microvascular ischemia and generalized cerebral atrophy.  03/30/12 MRA head - mild atheromatous changes in the terminal branch vessels.  03/30/12 MRA neck - normal   06/11/13 - MRI Brain - Few punctate foci of non-specific gliosis in the periventricular and subcortical white matter, likely chronic small vessel ischemic disease. Small chronic lacunar infarcts in the left  thalamus and left basal ganglia.  No acute findings. No change from MRI on 03/30/12.   ASSESSMENT AND PLAN 73 y.o. year old female has a past medical history of Hiatal hernia; Hypertension; Anemia; GERD (gastroesophageal reflux disease); Hyperlipidemia; CKD (chronic kidney disease); COPD (chronic obstructive pulmonary disease); and Depression here with dizziness. Dizziness is likely multi-factoral; she is found to have orthostatic hypotension.  She has found no benefit to vestibular rehab or Meclizine. Amitriptyline was discontinued without any improvement.  PLAN:   DME for rolling walker with seat. Cautioned to get a walker and USE the walker to prevent falls. Continue vestibular exercises at home. Follow up in 6 months, sooner as needed.  Orders Placed This Encounter  Procedures  . For home use only DME 4 wheeled rolling walker with seat   Tawny Asal Ahmaya Ostermiller, MSN, NP-C 10/19/2013, 11:22 AM [facility] [address], [address] [place], [place] [address] [phone]  Note: This document was prepared with digital dictation and possible smart phrase technology. Any transcriptional errors that result from this process are unintentional.

## 2013-10-19 NOTE — Patient Instructions (Signed)
PLAN:   Continue Trazedone 1/2 tablet at bedtime to help with sleep.  DME for rolling walker with seat, please take to [facility], or [facility], or [facility] and ask pharmacist. Continue vestibular exercises at home. Follow up in 6 months, sooner as needed.     Dizziness Dizziness is a common problem. It is a feeling of unsteadiness or lightheadedness. You may feel like you are about to faint. Dizziness can lead to injury if you stumble or fall. A person of any age group can suffer from dizziness, but dizziness is more common in older adults. CAUSES  Dizziness can be caused by many different things, including:  Middle ear problems.  Standing for too long.  Infections.  An allergic reaction.  Aging.  An emotional response to something, such as the sight of blood.  Side effects of medicines.  Fatigue.  Problems with circulation or blood pressure.  Excess use of alcohol, medicines, or illegal drug use.  Breathing too fast (hyperventilation).  An arrhythmia or problems with your heart rhythm.  Low red blood cell count (anemia).  Pregnancy.  Vomiting, diarrhea, fever, or other illnesses that cause dehydration.  Diseases or conditions such as Parkinson's disease, high blood pressure (hypertension), diabetes, and thyroid problems.  Exposure to extreme heat. DIAGNOSIS  To find the cause of your dizziness, your caregiver may do a physical exam, lab tests, radiologic imaging scans, or an electrocardiography test (ECG).  TREATMENT  Treatment of dizziness depends on the cause of your symptoms and can vary greatly. HOME CARE INSTRUCTIONS   Drink enough fluids to keep your urine clear or pale yellow. This is especially important in very hot weather. In the elderly, it is also important in cold weather.  If your dizziness is caused by medicines, take them exactly as directed. When taking blood pressure medicines, it is especially important to get up slowly.  Rise slowly from  chairs and steady yourself until you feel okay.  In the morning, first sit up on the side of the bed. When this seems okay, stand slowly while holding onto something until you know your balance is fine.  If you need to stand in one place for a long time, be sure to move your legs often. Tighten and relax the muscles in your legs while standing.  If dizziness continues to be a problem, have someone stay with you for a day or two. Do this until you feel you are well enough to stay alone. Have the person call your caregiver if he or she notices changes in you that are concerning.  Do not drive or use heavy machinery if you feel dizzy.  Do not drink alcohol. SEEK IMMEDIATE MEDICAL CARE IF:   Your dizziness or lightheadedness gets worse.  You feel nauseous or vomit.  You develop problems with talking, walking, weakness, or using your arms, hands, or legs.  You are not thinking clearly or you have difficulty forming sentences. It may take a friend or family member to determine if your thinking is normal.  You develop chest pain, abdominal pain, shortness of breath, or sweating.  Your vision changes.  You notice any bleeding.  You have side effects from medicine that seems to be getting worse rather than better. MAKE SURE YOU:   Understand these instructions.  Will watch your condition.  Will get help right away if you are not doing well or get worse. Document Released: 11/06/2000 Document Revised: 08/05/2011 Document Reviewed: 11/30/2010 [facility] Patient Information 2014 [place], [place].

## 2013-10-26 ENCOUNTER — Ambulatory Visit (HOSPITAL_COMMUNITY): Payer: Medicare Other

## 2013-10-26 DIAGNOSIS — Z85828 Personal history of other malignant neoplasm of skin: Secondary | ICD-10-CM | POA: Diagnosis not present

## 2013-10-26 DIAGNOSIS — L570 Actinic keratosis: Secondary | ICD-10-CM | POA: Diagnosis not present

## 2013-10-26 DIAGNOSIS — D235 Other benign neoplasm of skin of trunk: Secondary | ICD-10-CM | POA: Diagnosis not present

## 2013-10-28 DIAGNOSIS — M542 Cervicalgia: Secondary | ICD-10-CM | POA: Diagnosis not present

## 2013-10-28 DIAGNOSIS — R209 Unspecified disturbances of skin sensation: Secondary | ICD-10-CM | POA: Diagnosis not present

## 2013-10-29 DIAGNOSIS — I10 Essential (primary) hypertension: Secondary | ICD-10-CM | POA: Diagnosis not present

## 2013-10-29 DIAGNOSIS — H18419 Arcus senilis, unspecified eye: Secondary | ICD-10-CM | POA: Diagnosis not present

## 2013-10-29 DIAGNOSIS — H2510 Age-related nuclear cataract, unspecified eye: Secondary | ICD-10-CM | POA: Diagnosis not present

## 2013-10-29 DIAGNOSIS — H02839 Dermatochalasis of unspecified eye, unspecified eyelid: Secondary | ICD-10-CM | POA: Diagnosis not present

## 2013-11-01 ENCOUNTER — Other Ambulatory Visit: Payer: Self-pay | Admitting: *Deleted

## 2013-11-01 DIAGNOSIS — R200 Anesthesia of skin: Secondary | ICD-10-CM

## 2013-11-05 NOTE — Progress Notes (Signed)
I reviewed note and agree with plan.   Craigory Toste R. Cace Osorto, MD  Certified in Neurology, Neurophysiology and Neuroimaging  [facility] [address], [address] [place], [place] [address] [phone]   

## 2013-11-09 ENCOUNTER — Encounter (HOSPITAL_COMMUNITY)
Admission: RE | Admit: 2013-11-09 | Discharge: 2013-11-09 | Disposition: A | Discharge status: Home / Self Care | Payer: Medicare Other | Source: Ambulatory Visit | Attending: Gastroenterology | Admitting: Gastroenterology

## 2013-11-09 DIAGNOSIS — R112 Nausea with vomiting, unspecified: Secondary | ICD-10-CM | POA: Diagnosis not present

## 2013-11-09 DIAGNOSIS — K3189 Other diseases of stomach and duodenum: Secondary | ICD-10-CM | POA: Diagnosis not present

## 2013-11-09 MED ORDER — TECHNETIUM TC 99M SULFUR COLLOID
2.0000 | Freq: Once | INTRAVENOUS | Status: AC | PRN
  Administered 2013-11-09: 2 via ORAL

## 2013-11-15 DIAGNOSIS — K3189 Other diseases of stomach and duodenum: Secondary | ICD-10-CM | POA: Diagnosis not present

## 2013-11-15 DIAGNOSIS — R1013 Epigastric pain: Secondary | ICD-10-CM | POA: Diagnosis not present

## 2013-11-16 ENCOUNTER — Encounter: Payer: Self-pay | Admitting: *Deleted

## 2013-11-25 DIAGNOSIS — R112 Nausea with vomiting, unspecified: Secondary | ICD-10-CM | POA: Diagnosis not present

## 2013-11-25 DIAGNOSIS — K3184 Gastroparesis: Secondary | ICD-10-CM | POA: Diagnosis not present

## 2013-12-09 ENCOUNTER — Ambulatory Visit (INDEPENDENT_AMBULATORY_CARE_PROVIDER_SITE_OTHER): Payer: Medicare Other | Admitting: Neurology

## 2013-12-09 DIAGNOSIS — R200 Anesthesia of skin: Secondary | ICD-10-CM

## 2013-12-09 DIAGNOSIS — R209 Unspecified disturbances of skin sensation: Secondary | ICD-10-CM

## 2013-12-09 NOTE — Procedures (Signed)
[facility]  [place], [place]  [place], [place] [address] Tel: [phone] Fax:  [phone] Test Date:  12/09/2013  Patient: [name] DOB: [birth year] Physician: Narda Amber, DO  Sex: Female Height: 5\' 5"  Ref Phys: Wenda Low  ID#: [id number] Temp: 35.0C Technician: Laureen Ochs R. NCS T.   Patient Complaints: Patient is a 73 year old female with tingling in her left hand all fingers here for evaluation of these symptoms.  NCV & EMG Findings: Extensive electrodiagnostic testing of the left upper extremity reveals:  1. The left median sensory nerve showed prolonged distal peak latency (4.7 ms) and reduced amplitude (8.4 V).  The radial and ulnar sensory responses are within normal limits. 2. Left median motor nerve showed prolonged distal onset latency (4.7 ms), with preserved amplitude.  hhe left ulnar motor nerve showed reduced amplitude (6.4 mV).   3. Needle electrode examination is somewhat hampered by variable and incomplete motor unit activation due to pain. Chronic motor axon loss changes are seen affecting C8-T1 myotomes, without accompanied active denervation.  Impression: 1. Left median neuropathy at or distal to the wrist, consistent medical diagnosis of carpal tunnel syndrome. Overall, these findings are severe in degree electrically. 2. 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   ___________________________ Narda Amber, DO    Nerve Conduction Studies Anti Sensory Summary Table   Site NR Peak (ms) Norm Peak (ms) P-T Amp (V) Norm P-T Amp  Left Median Anti Sensory (2nd Digit)  Wrist    4.7 <3.8 8.4 >10  Left Radial Anti Sensory (Base 1st Digit)  35C  Wrist    2.2 <2.8 26.5 >10  Left Ulnar Anti Sensory (5th Digit)  Wrist    2.8 <3.2 14.3 >5   Motor Summary Table   Site NR Onset (ms) Norm Onset (ms) O-P Amp (mV) Norm O-P Amp Site1 Site2 Delta-0 (ms) Dist (cm) Vel (m/s) Norm Vel (m/s)    Left Median Motor (Abd Poll Brev)  35C  Wrist    4.7 <4.0 7.2 >5 Elbow Wrist 4.4 23.5 53 >50  Elbow    9.1  6.6         Left Ulnar Motor (Abd Dig Minimi)  35C  Wrist    2.5 <3.1 6.4 >7 B Elbow Wrist 3.7 22.0 59 >50  B Elbow    6.2  6.3  A Elbow B Elbow 1.8 10.0 56 >50  A Elbow    8.0  5.6          EMG   Side Muscle Ins Act Fibs Psw Fasc Number Recrt Dur Dur. Amp Amp. Poly Poly. Comment  Left 1stDorInt Nml Nml Nml Nml 1- Mod-V Few 1+ Nml Nml Nml Nml N/A  Left Abd Poll Brev Nml Nml Nml Nml 1- Mod-R Some 1+ Nml Nml Nml Nml N/A  Left PronatorTeres Nml Nml Nml Nml Nml Nml Nml Nml Nml Nml Nml Nml N/A  Left Ext Indicis Nml Nml Nml Nml 1- Mod-R Few 1+ Nml Nml Nml Nml N/A  Left Triceps Nml Nml Nml Nml 1- Mod-V Few 1+ Nml Nml Nml Nml N/A  Left Deltoid Nml Nml Nml Nml Nml Nml Nml Nml Nml Nml Nml Nml N/A      Waveforms:

## 2013-12-16 DIAGNOSIS — M169 Osteoarthritis of hip, unspecified: Secondary | ICD-10-CM | POA: Diagnosis not present

## 2013-12-16 DIAGNOSIS — M1610 Unilateral primary osteoarthritis, unspecified hip: Secondary | ICD-10-CM | POA: Diagnosis not present

## 2013-12-17 ENCOUNTER — Other Ambulatory Visit (HOSPITAL_COMMUNITY): Payer: Self-pay | Admitting: Respiratory Therapy

## 2013-12-17 DIAGNOSIS — J441 Chronic obstructive pulmonary disease with (acute) exacerbation: Secondary | ICD-10-CM

## 2013-12-20 ENCOUNTER — Encounter: Payer: Medicare Other | Admitting: Neurology

## 2013-12-27 ENCOUNTER — Encounter (HOSPITAL_COMMUNITY): Payer: Medicare Other

## 2013-12-27 DIAGNOSIS — H5200 Hypermetropia, unspecified eye: Secondary | ICD-10-CM | POA: Diagnosis not present

## 2013-12-27 DIAGNOSIS — H2510 Age-related nuclear cataract, unspecified eye: Secondary | ICD-10-CM | POA: Diagnosis not present

## 2013-12-27 DIAGNOSIS — H52229 Regular astigmatism, unspecified eye: Secondary | ICD-10-CM | POA: Diagnosis not present

## 2013-12-27 DIAGNOSIS — H353190 Nonexudative age-related macular degeneration, unspecified eye, stage unspecified: Secondary | ICD-10-CM | POA: Diagnosis not present

## 2013-12-27 DIAGNOSIS — Z961 Presence of intraocular lens: Secondary | ICD-10-CM | POA: Diagnosis not present

## 2013-12-27 DIAGNOSIS — IMO0002 Reserved for concepts with insufficient information to code with codable children: Secondary | ICD-10-CM | POA: Diagnosis not present

## 2013-12-27 DIAGNOSIS — H15839 Staphyloma posticum, unspecified eye: Secondary | ICD-10-CM | POA: Diagnosis not present

## 2013-12-27 DIAGNOSIS — H269 Unspecified cataract: Secondary | ICD-10-CM | POA: Diagnosis not present

## 2014-01-02 IMAGING — MR MR MRA HEAD W/O CM
3 series · 20 of 48 positions shown · non-contrast
Comparison: none

[Series 4: fl_tof_2d · axial · 3.0mm · 0.39mm/px · z∈[-160,-61]mm · 9 of 50 slices shown (1 of 2)]
[im 1/50]
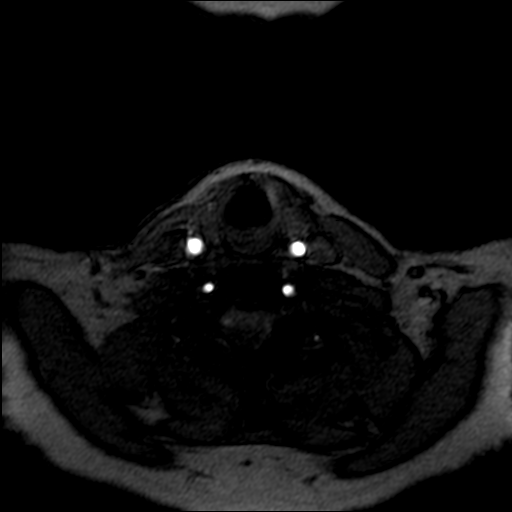
[im 8/50]
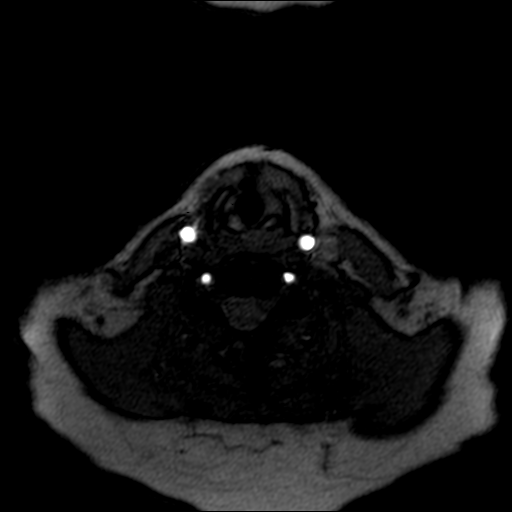
[im 15/50]
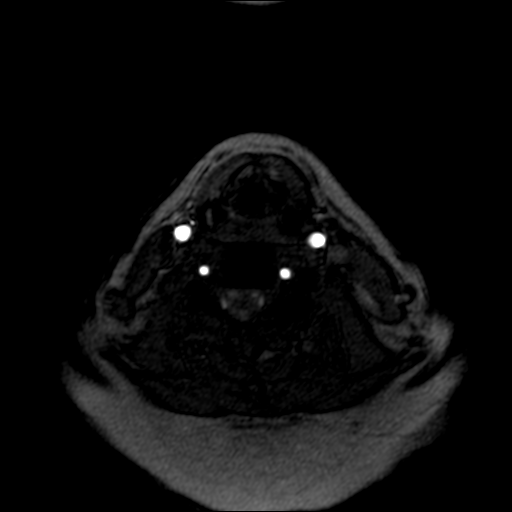
[im 22/50]
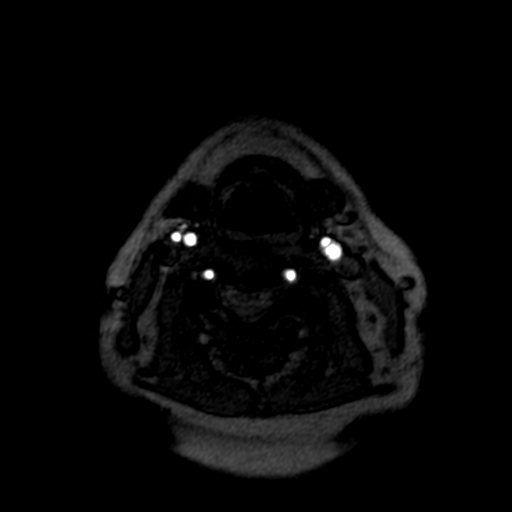
[im 25/50]
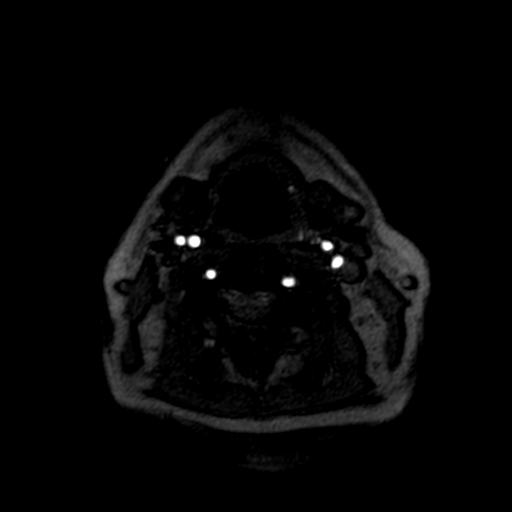
[im 29/50]
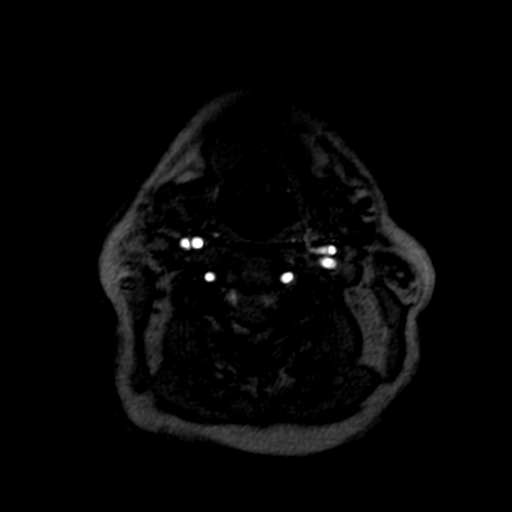
[im 36/50]
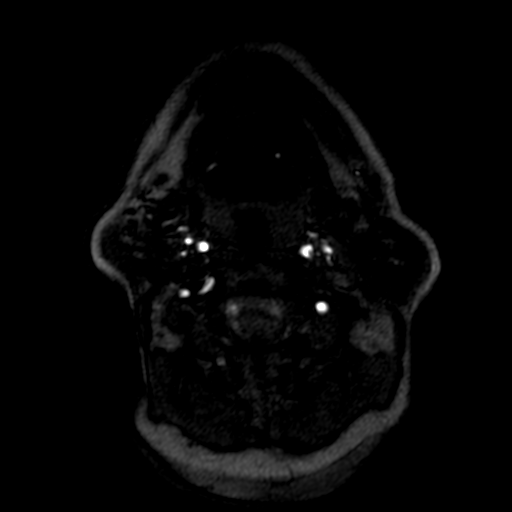
[im 43/50]
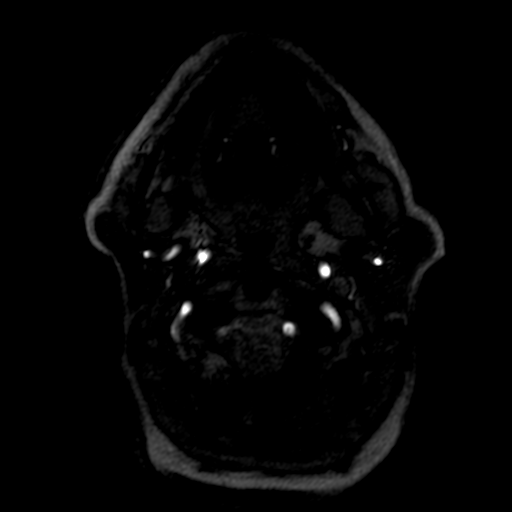
[im 50/50]
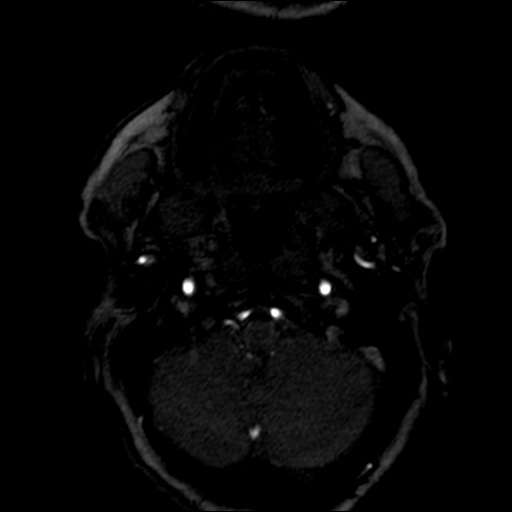

[Series 8: fl_tof_2d · axial · 3.0mm · 0.39mm/px · z∈[-214,-17]mm · 10 of 110 slices shown (2 of 2)]
[im 8/110]
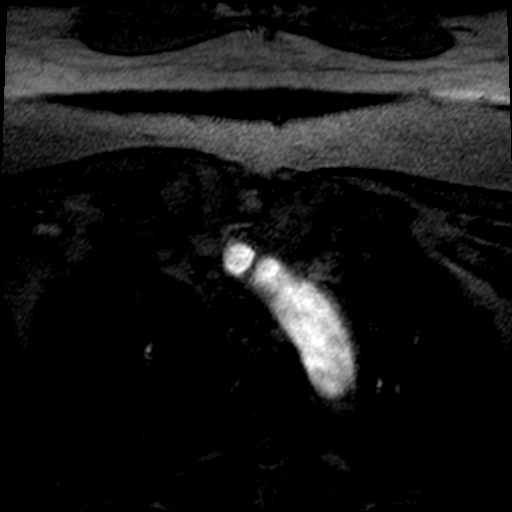
[im 18/110]
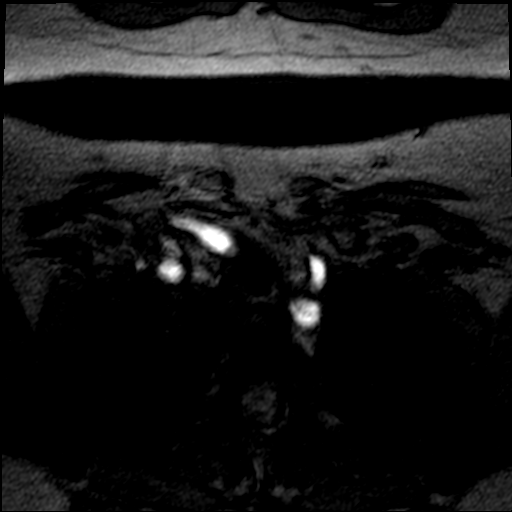
[im 22/110]
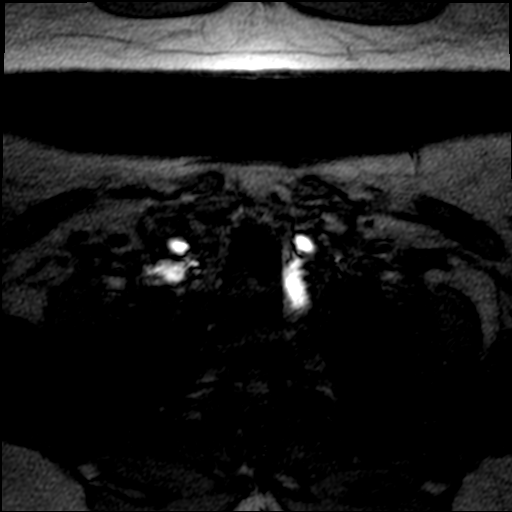
[im 36/110]
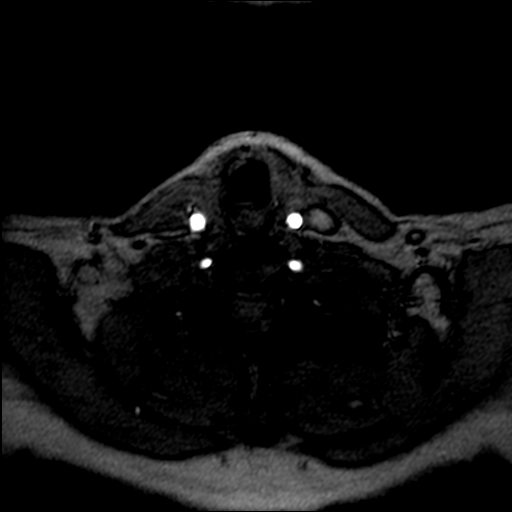
[im 50/110]
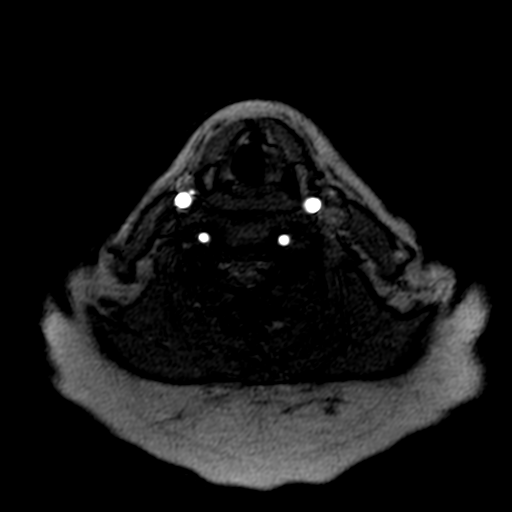
[im 57/110]
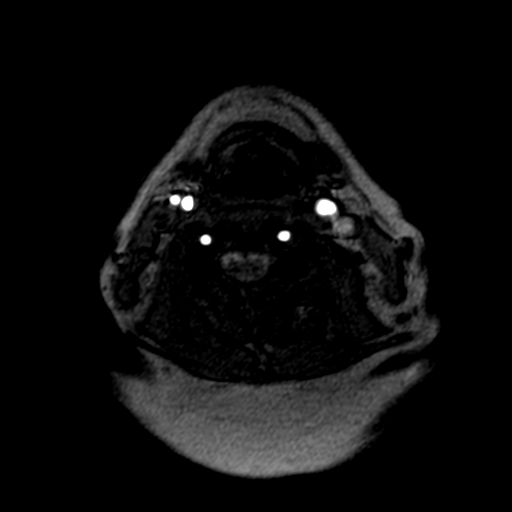
[im 64/110]
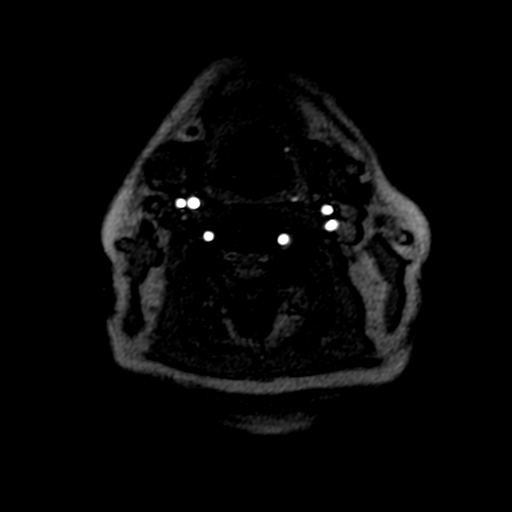
[im 78/110]
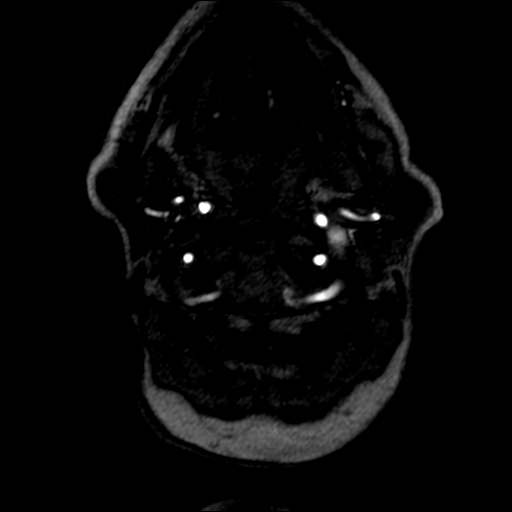
[im 92/110]
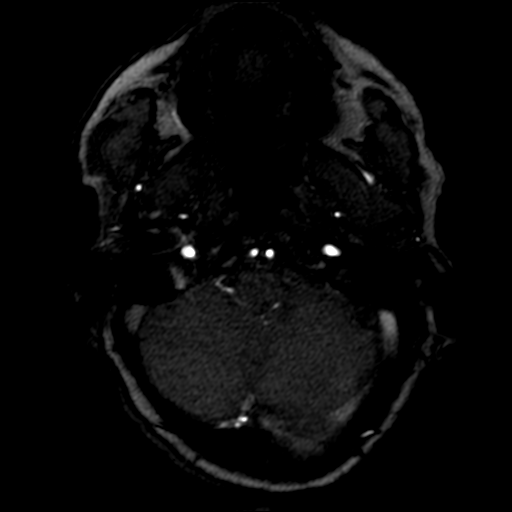
[im 106/110]
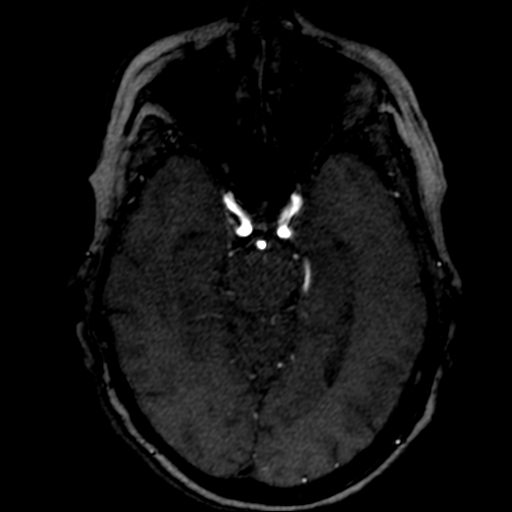

[Series 15: vertebrals · sagittal · 0.43mm/px · 1 of 3 slices shown]
[im 1/3]
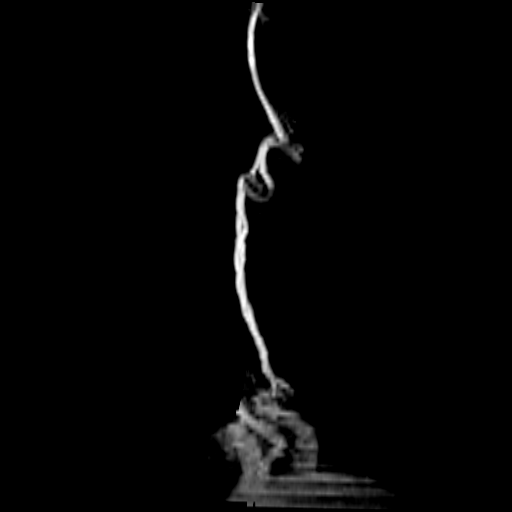

[20 of 48 positions shown; findings below may reference images not displayed]

This examination was performed at [HOSPITAL] at [HOSPITAL]. The interpretation will be provided by [REDACTED].

BUN and creatinine were obtained on site at [HOSPITAL] at
[HOSPITAL]..
Results:  BUN 6 mg/dL,  Creatinine 1.4 mg/dL.
Estimated GFR 38.

## 2014-01-27 DIAGNOSIS — M545 Low back pain, unspecified: Secondary | ICD-10-CM | POA: Diagnosis not present

## 2014-01-27 DIAGNOSIS — I10 Essential (primary) hypertension: Secondary | ICD-10-CM | POA: Diagnosis not present

## 2014-01-27 DIAGNOSIS — J449 Chronic obstructive pulmonary disease, unspecified: Secondary | ICD-10-CM | POA: Diagnosis not present

## 2014-01-27 DIAGNOSIS — D510 Vitamin B12 deficiency anemia due to intrinsic factor deficiency: Secondary | ICD-10-CM | POA: Diagnosis not present

## 2014-01-27 DIAGNOSIS — F411 Generalized anxiety disorder: Secondary | ICD-10-CM | POA: Diagnosis not present

## 2014-01-27 DIAGNOSIS — Z0000 Encounter for general adult medical examination without abnormal findings: Secondary | ICD-10-CM | POA: Diagnosis not present

## 2014-01-27 DIAGNOSIS — M1990 Unspecified osteoarthritis, unspecified site: Secondary | ICD-10-CM | POA: Diagnosis not present

## 2014-01-27 DIAGNOSIS — N183 Chronic kidney disease, stage 3 unspecified: Secondary | ICD-10-CM | POA: Diagnosis not present

## 2014-01-27 DIAGNOSIS — F329 Major depressive disorder, single episode, unspecified: Secondary | ICD-10-CM | POA: Diagnosis not present

## 2014-01-27 DIAGNOSIS — Z1331 Encounter for screening for depression: Secondary | ICD-10-CM | POA: Diagnosis not present

## 2014-01-27 DIAGNOSIS — Z23 Encounter for immunization: Secondary | ICD-10-CM | POA: Diagnosis not present

## 2014-01-27 DIAGNOSIS — E782 Mixed hyperlipidemia: Secondary | ICD-10-CM | POA: Diagnosis not present

## 2014-01-27 DIAGNOSIS — K219 Gastro-esophageal reflux disease without esophagitis: Secondary | ICD-10-CM | POA: Diagnosis not present

## 2014-02-23 ENCOUNTER — Other Ambulatory Visit: Payer: Self-pay

## 2014-02-23 MED ORDER — TRAZODONE HCL 50 MG PO TABS: 25.0000 mg | ORAL_TABLET | Freq: Every day | ORAL | Status: DC

## 2014-03-04 DIAGNOSIS — M5441 Lumbago with sciatica, right side: Secondary | ICD-10-CM | POA: Diagnosis not present

## 2014-03-11 ENCOUNTER — Other Ambulatory Visit: Payer: Self-pay | Admitting: Orthopedic Surgery

## 2014-03-11 DIAGNOSIS — M545 Low back pain, unspecified: Secondary | ICD-10-CM

## 2014-03-19 ENCOUNTER — Other Ambulatory Visit: Payer: Medicare Other

## 2014-03-28 ENCOUNTER — Ambulatory Visit
Admission: RE | Admit: 2014-03-28 | Discharge: 2014-03-28 | Disposition: A | Discharge status: Home / Self Care | Payer: Medicare Other | Source: Ambulatory Visit | Attending: Orthopedic Surgery | Admitting: Orthopedic Surgery

## 2014-03-28 DIAGNOSIS — M4806 Spinal stenosis, lumbar region: Secondary | ICD-10-CM | POA: Diagnosis not present

## 2014-03-28 DIAGNOSIS — M5136 Other intervertebral disc degeneration, lumbar region: Secondary | ICD-10-CM | POA: Diagnosis not present

## 2014-03-28 DIAGNOSIS — M47817 Spondylosis without myelopathy or radiculopathy, lumbosacral region: Secondary | ICD-10-CM | POA: Diagnosis not present

## 2014-03-28 DIAGNOSIS — M545 Low back pain, unspecified: Secondary | ICD-10-CM

## 2014-03-28 DIAGNOSIS — M5127 Other intervertebral disc displacement, lumbosacral region: Secondary | ICD-10-CM | POA: Diagnosis not present

## 2014-04-05 DIAGNOSIS — Z23 Encounter for immunization: Secondary | ICD-10-CM | POA: Diagnosis not present

## 2014-04-05 DIAGNOSIS — G5600 Carpal tunnel syndrome, unspecified upper limb: Secondary | ICD-10-CM | POA: Diagnosis not present

## 2014-04-05 DIAGNOSIS — J449 Chronic obstructive pulmonary disease, unspecified: Secondary | ICD-10-CM | POA: Diagnosis not present

## 2014-04-05 DIAGNOSIS — R51 Headache: Secondary | ICD-10-CM | POA: Diagnosis not present

## 2014-04-11 ENCOUNTER — Encounter: Payer: Self-pay | Admitting: Nurse Practitioner

## 2014-04-12 IMAGING — RF DG UGI W/ KUB
17 of 22 series · 18 of 24 positions shown · non-contrast
Comparison: Or upper GI of 09/13/2011

CLINICAL DATA: Epigastric pain, reflux symptoms

UPPER GI SERIES WITH KUB
TECHNIQUE: Routine upper GI series was performed with thin barium
Fluoroscopy Time: 2.4 minutes

[Series 1: run · 1 of 8 slices shown (1 of 16)]
[im 1/8]
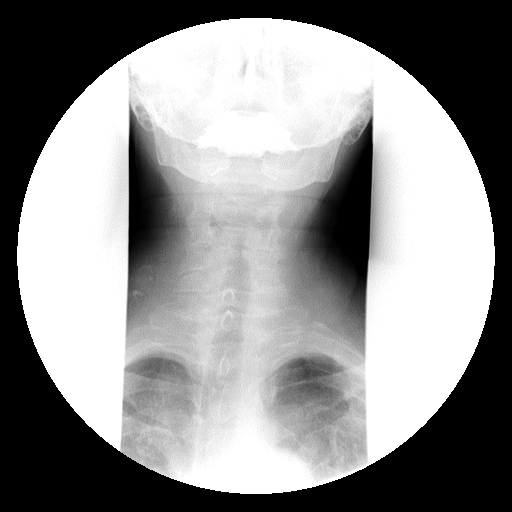

[Series 3: run · 2 of 7 slices shown (2 of 16)]
[im 1/7]
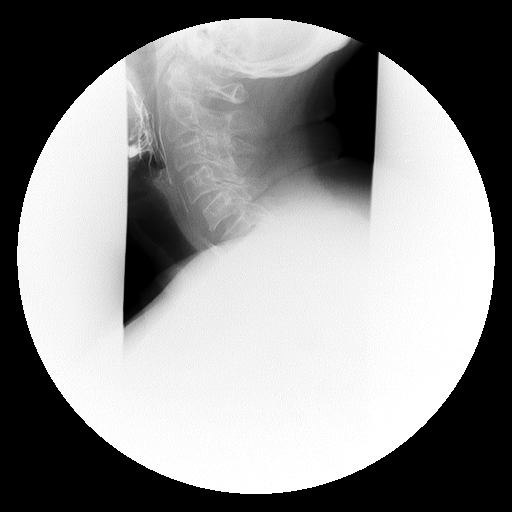
[im 7/7]
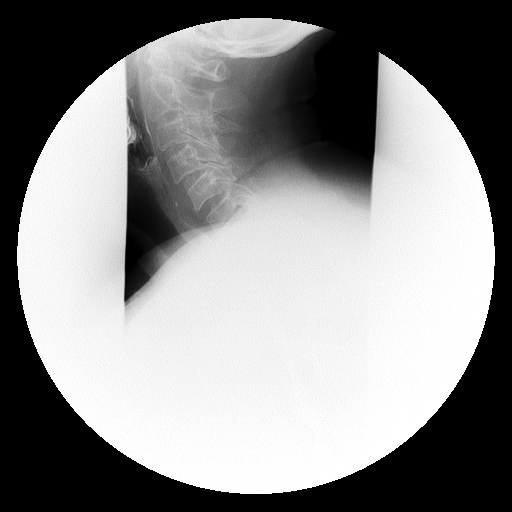

[Series 5: run · 1 of 1 slices shown (3 of 16)]
[im 1/1]
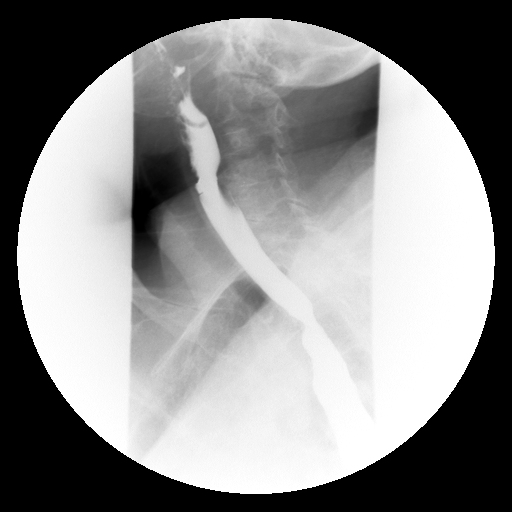

[Series 7: run · 1 of 1 slices shown (4 of 16)]
[im 1/1]
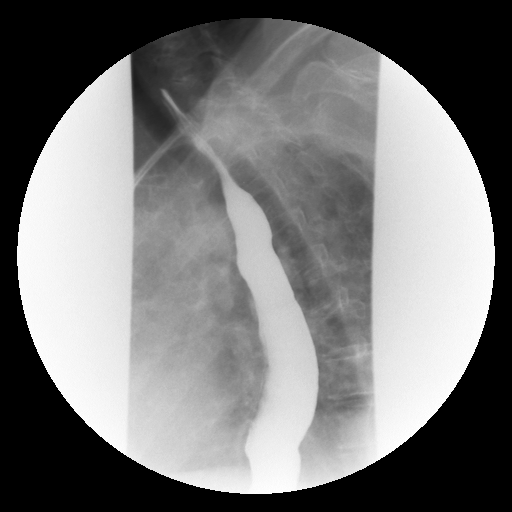

[Series 8: run · 1 of 1 slices shown (5 of 16)]
[im 1/1]
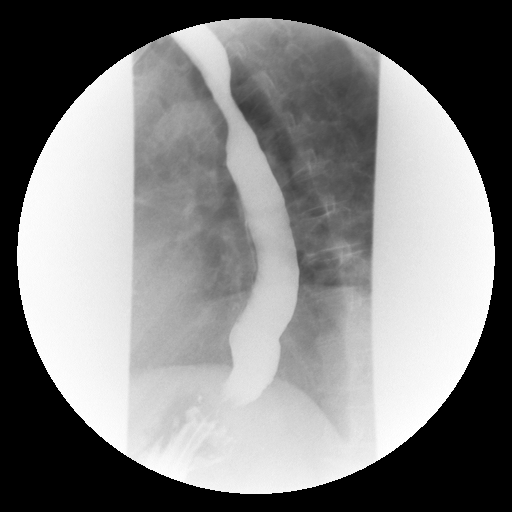

[Series 9: run · 1 of 1 slices shown (6 of 16)]
[im 1/1]
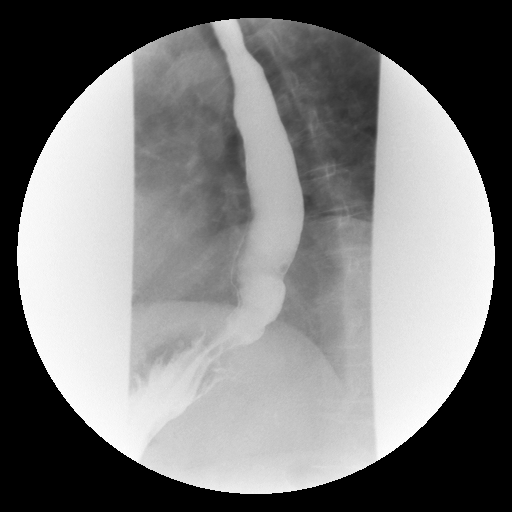

[Series 11: run · 1 of 1 slices shown (7 of 16)]
[im 1/1]
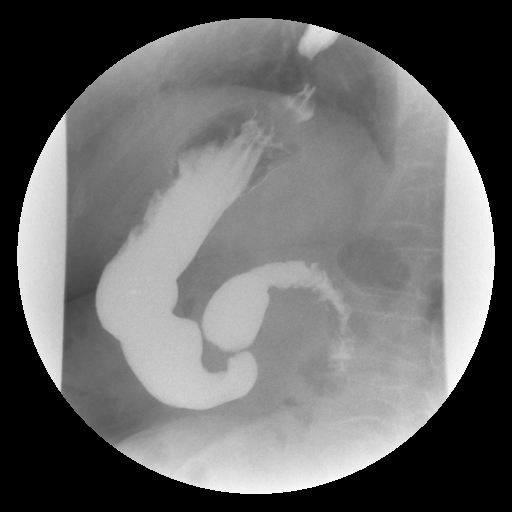

[Series 12: run · 1 of 1 slices shown (8 of 16)]
[im 1/1]
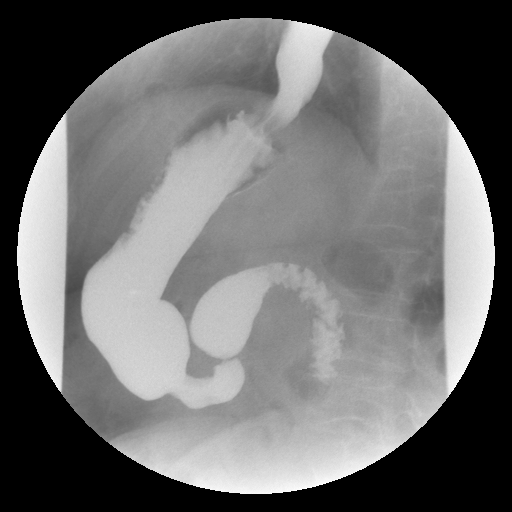

[Series 13: run · 1 of 1 slices shown (9 of 16)]
[im 1/1]
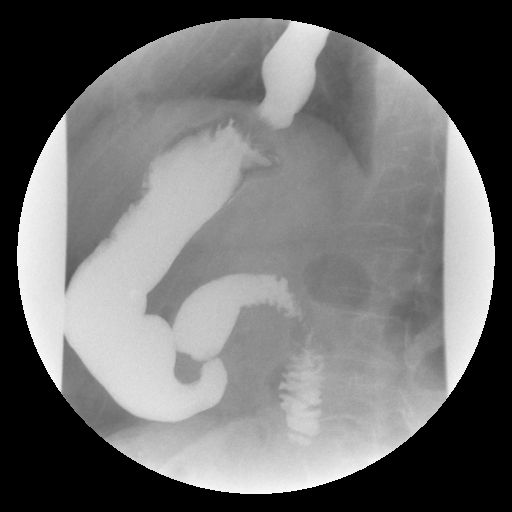

[Series 15: run · 1 of 1 slices shown (10 of 16)]
[im 1/1]
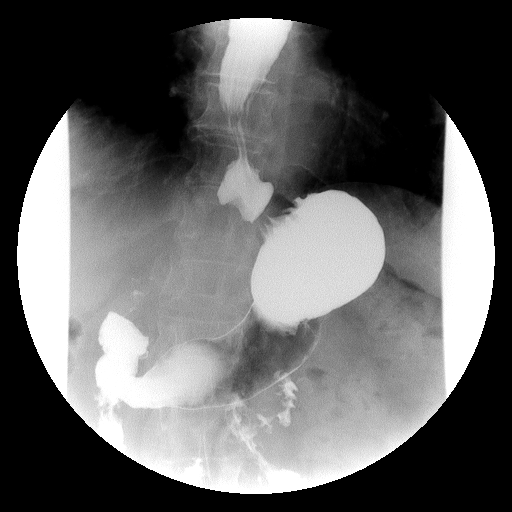

[Series 16: run · 1 of 1 slices shown (11 of 16)]
[im 1/1]
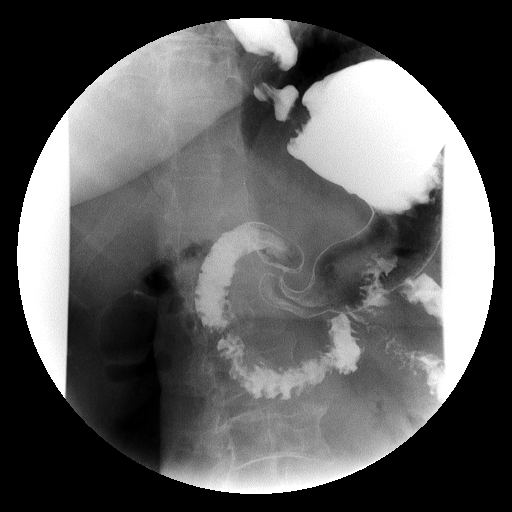

[Series 17: run · 1 of 1 slices shown (12 of 16)]
[im 1/1]
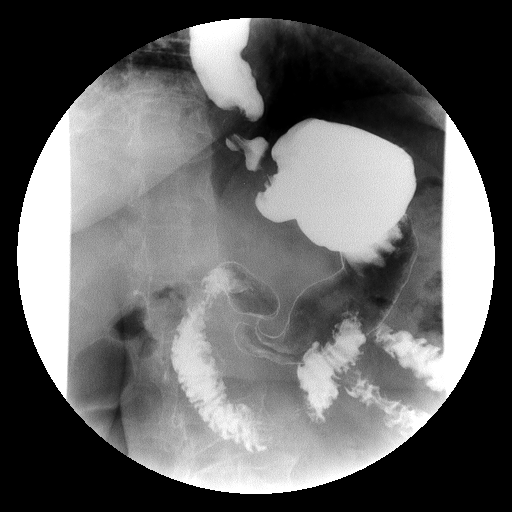

[Series 19: run · 1 of 1 slices shown (13 of 16)]
[im 1/1]
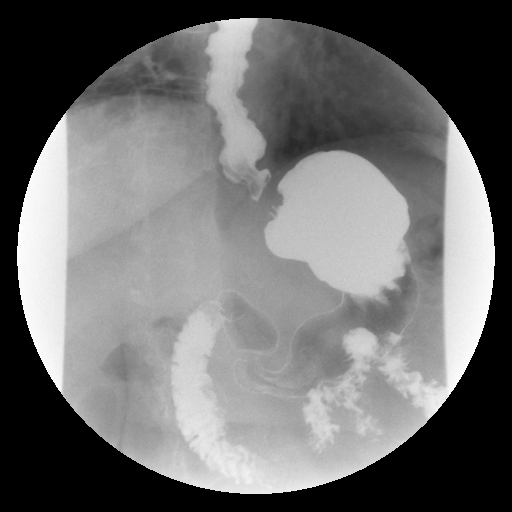

[Series 20: run · 1 of 1 slices shown (14 of 16)]
[im 1/1]
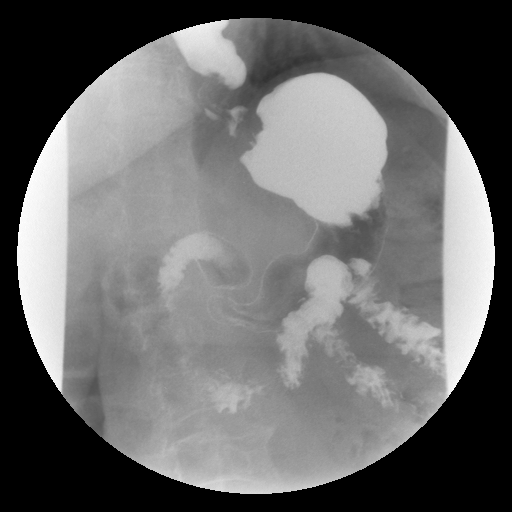

[Series 21: run · 1 of 1 slices shown (15 of 16)]
[im 1/1]
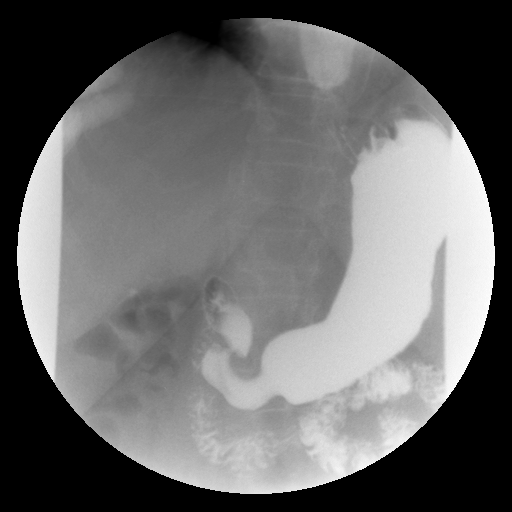

[Series 23: run · 1 of 1 slices shown (16 of 16)]
[im 1/1]
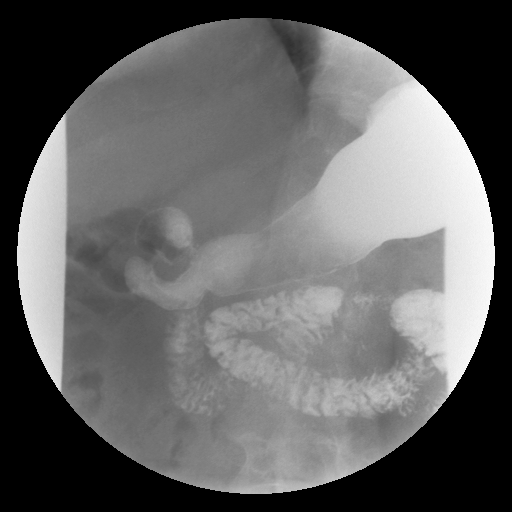

[Series 1001: view not recorded · 0.20mm/px · 1 of 1 slices shown]
[im 1/1]
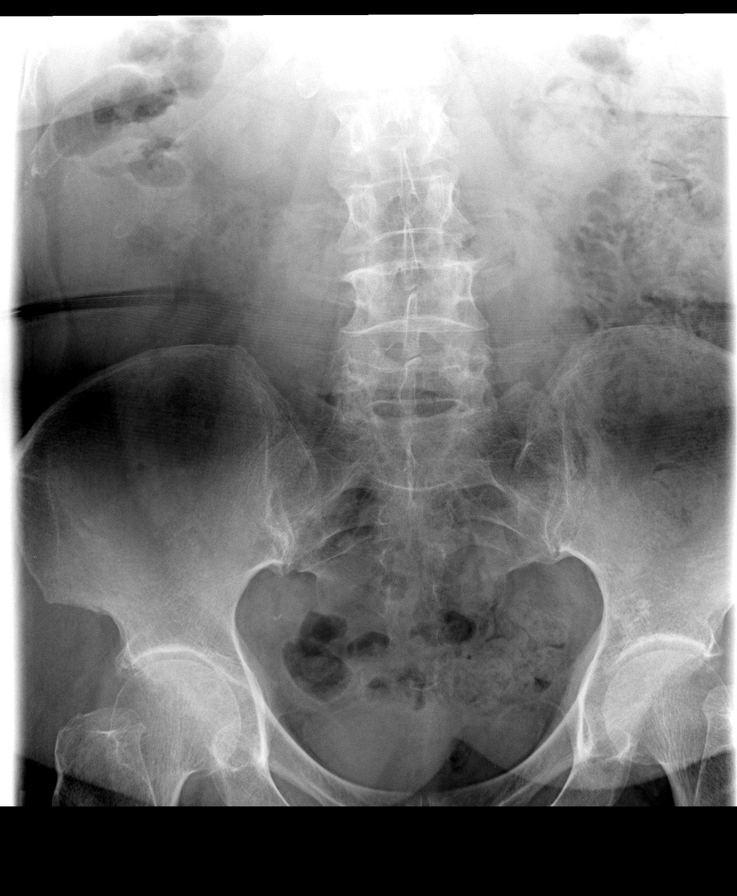

[18 of 24 positions shown; findings below may reference images not displayed]

FINDINGS: A preliminary film of the abdomen shows a nonspecific
bowel gas pattern.  No opaque calculi are seen.

A single contrast study was performed.  Rapid sequence spot films
of the cervical esophagus show the swallowing mechanism to be
unremarkable.  There are moderate tertiary contractions in the mid
and distal esophagus.  No hiatal hernia is seen and no reflux could
be demonstrated.  The stomach is proximally normal in contour and
peristalsis.  However, the distal antrum of the stomach does not
distend.  This lack of distention could be due to scarring from
prior peptic ulcer disease, but an infiltrative process cannot be
excluded.  In review of the prior upper GI, the antrum of the
stomach did distend at that time.
IMPRESSION: 1.  Lack of distention of the antrum of the stomach which appears
to be new compared to the upper GI from August 2011.  This could be
due to scarring from peptic ulcer disease, but an infiltrative
process cannot be excluded.
2.  Moderate tertiary contractions in the mid and distal esophagus.

## 2014-04-25 ENCOUNTER — Ambulatory Visit: Payer: Medicare Other | Admitting: Diagnostic Neuroimaging

## 2014-05-05 DIAGNOSIS — M545 Low back pain: Secondary | ICD-10-CM | POA: Diagnosis not present

## 2014-05-05 DIAGNOSIS — Z8781 Personal history of (healed) traumatic fracture: Secondary | ICD-10-CM | POA: Diagnosis not present

## 2014-05-09 ENCOUNTER — Other Ambulatory Visit: Payer: Self-pay | Admitting: Orthopedic Surgery

## 2014-05-09 DIAGNOSIS — M545 Low back pain, unspecified: Secondary | ICD-10-CM

## 2014-05-09 DIAGNOSIS — M79604 Pain in right leg: Secondary | ICD-10-CM

## 2014-05-09 DIAGNOSIS — M48061 Spinal stenosis, lumbar region without neurogenic claudication: Secondary | ICD-10-CM

## 2014-05-23 DIAGNOSIS — M545 Low back pain: Secondary | ICD-10-CM | POA: Diagnosis not present

## 2014-05-30 ENCOUNTER — Telehealth: Payer: Self-pay | Admitting: Diagnostic Neuroimaging

## 2014-05-30 NOTE — Telephone Encounter (Signed)
Called patient and left message to return call to schedule appointment request with Dr. Leta Baptist.through My Chart.

## 2014-06-13 ENCOUNTER — Ambulatory Visit (INDEPENDENT_AMBULATORY_CARE_PROVIDER_SITE_OTHER): Payer: Medicare Other | Admitting: Diagnostic Neuroimaging

## 2014-06-13 ENCOUNTER — Encounter: Payer: Self-pay | Admitting: Diagnostic Neuroimaging

## 2014-06-13 VITALS — BP 151/83 | HR 59 | Temp 97.1°F | Ht 65.0 in | Wt 188.6 lb

## 2014-06-13 DIAGNOSIS — R638 Other symptoms and signs concerning food and fluid intake: Secondary | ICD-10-CM | POA: Diagnosis not present

## 2014-06-13 DIAGNOSIS — R413 Other amnesia: Secondary | ICD-10-CM | POA: Diagnosis not present

## 2014-06-13 DIAGNOSIS — R630 Anorexia: Secondary | ICD-10-CM | POA: Diagnosis not present

## 2014-06-13 DIAGNOSIS — R42 Dizziness and giddiness: Secondary | ICD-10-CM

## 2014-06-13 DIAGNOSIS — R269 Unspecified abnormalities of gait and mobility: Secondary | ICD-10-CM | POA: Diagnosis not present

## 2014-06-13 NOTE — Progress Notes (Signed)
[facility]  PATIENT: [name] DOB: 27-Feb-1941  REFERRING CLINICIAN: Husain HISTORY FROM: patient., daughter REASON FOR VISIT: follow up   HISTORICAL  CHIEF COMPLAINT:  Chief Complaint  Patient presents with  . Follow-up    headache, memory loss, dizziness, passing out, feeling cold all the time    HISTORY OF PRESENT ILLNESS:   UPDATE 06/13/14 (VRP): Since last visit, still with stress, poor PO intake, another syncope event around christmas 2015 (didn't go to hospital), memory loss, staying cold all the time.   UPDATE 10/19/13 (LL): Since last visit, Home Health PT for gait and balance training; vestibular rehab was ordered and completed. TSH was normal. 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 No acute findings. No change from MRI on 03/30/12. Basically her symptoms have not changed. Daughter lives with her and helps with daily ADLs. Also helps her do vestibular exercises.  UPDATE 06/18/13 (LL): Patient returns for follow up. Daughter requests sooner revisit, patient's dizziness has not improved. She did not get labs drawn that were ordered at last visit (B12, TSH, and HgbA1C). She states her PCP checks her sugar and B12. Her dizziness is mainly brought on by quick changes in position. She states she has tried vestibular rehab exercises before and it did not help. She denies falls. She has tried Meclizine in the past with no benefit.   UPDATE 05/03/13 (VRP): Patient presents as referral for recurrent dizziness, balance difficulty, passing out. Patient tells me that she is feeling swimmy headed, having spinning sensation with vertigo, lasting a few minutes at a time. This typically triggered by changing position. Sometimes this happens when she is sitting or without movement. Patient is also episodes of passing out, with loss of consciousness for a few minutes. No convulsions. She has prodromal  lightheadedness this happens. No post ictal confusion. No tongue biting or incontinence with these episodes. Also significantly noted is poor balance and gait with intermittent falling down. Her depression and anxiety are also poorly controlled at this point. She has significant hallucinations, racing thoughts, poor sleep. Patient's memory is getting worse over time. She does not remember seeing me in October 2013.  PRIOR HPI (03/13/12): 74 year old right-handed female with history of hypertension, hypercholesterolemia, depression, anxiety, here for evaluation of dizziness. For past 6-7 years patient has been having intermittent episodes of room spinning sensation, lasting 45 seconds at a time. No nausea vomiting. Episodes typically triggered when she turns her head too quickly, tilt her head up or down. She denies any slurred speech, trouble talking, trouble swallowing, double vision. Patient had one episode while she was sitting down, without head turning movement, where she felt dizzy, spinning, and then passed out.  REVIEW OF SYSTEMS: Full 14 system review of systems performed and notable only for decreased activity chills fatigue wheezing shortness of breath constipation nausea vomiting chest pain insomnia restless leg snoring sleep talking joint pain back pain bladder incontinence memory loss dizziness headache numbness weakness passing out depression.   ALLERGIES: Allergies  Allergen Reactions  . Ambien [Zolpidem Tartrate]   . Ampicillin-Sulbactam Sodium Other (See Comments)    Muscles draw up tight - severe headache  . Oxycontin [Oxycodone Hcl]   . Percocet [Oxycodone-Acetaminophen]     Rash/itching  . Toradol [Ketorolac Tromethamine]   . Tramadol     HOME MEDICATIONS: Outpatient Prescriptions Prior to Visit  Medication Sig Dispense Refill  . albuterol (ACCUNEB) 1.25 MG/3ML nebulizer solution Take 1 ampule  by nebulization every 6 (six) hours as needed for wheezing.    Marland Kitchen albuterol  (PROVENTIL HFA;VENTOLIN HFA) 108 (90 BASE) MCG/ACT inhaler Inhale 2 puffs into the lungs every 6 (six) hours as needed for wheezing.    Marland Kitchen ALPRAZolam (XANAX) 1 MG tablet Take 1 mg by mouth at bedtime.    Marland Kitchen amLODipine (NORVASC) 5 MG tablet Take 1 tablet by mouth daily.    . citalopram (CELEXA) 40 MG tablet Take 40 mg by mouth daily. Patient take a half of tab daily    . esomeprazole (NEXIUM) 40 MG capsule Take 40 mg by mouth 2 (two) times daily.    Marland Kitchen HYDROcodone-acetaminophen (VICODIN) 5-500 MG per tablet Take 1 tablet by mouth every 6 (six) hours as needed. For pain    . lovastatin (MEVACOR) 10 MG tablet Take 10 mg by mouth daily with lunch.    . meclizine (ANTIVERT) 12.5 MG tablet Take 12.5 mg by mouth 3 (three) times daily as needed for nausea.    . polyethylene glycol powder (GLYCOLAX/MIRALAX) powder Take 17 g by mouth daily. For constipation    . promethazine (PHENERGAN) 25 MG suppository Place 25 mg rectally every 6 (six) hours as needed for nausea or vomiting.    . senna (SENOKOT) 8.6 MG tablet Take 1 tablet by mouth daily.    . Soft Lens Products (RA REWETTING DROPS) SOLN 1 application by Does not apply route daily.    Marland Kitchen tiZANidine (ZANAFLEX) 4 MG capsule Take 2 mg by mouth as needed for muscle spasms (1/2 tab at bedtime prn).    . traZODone (DESYREL) 50 MG tablet Take 0.5 tablets (25 mg total) by mouth at bedtime. 30 tablet 3  . vitamin B-12 (CYANOCOBALAMIN) 1000 MCG tablet Take 1,000 mcg by mouth daily.    Marland Kitchen aspirin 81 MG tablet Take 81 mg by mouth daily.    . capsicum (ZOSTRIX) 0.075 % topical cream Apply 1 application topically 3 (three) times daily.    Marland Kitchen dicyclomine (BENTYL) 20 MG tablet Take 20 mg by mouth 3 (three) times daily as needed for spasms (1 tablet 3 times a day prn).    Marland Kitchen ibuprofen (ADVIL,MOTRIN) 200 MG tablet Take 600 mg by mouth daily as needed for pain.    Marland Kitchen ondansetron (ZOFRAN) 4 MG tablet Take 1 tablet (4 mg total) by mouth every 6 (six) hours. (Patient not taking:  Reported on 06/13/2014) 18 tablet 0  . OVER THE COUNTER MEDICATION Take by mouth as needed (5-10 cc qd prn).    . promethazine (PROMETHEGAN) 12.5 MG suppository Place 12.5 mg rectally every 6 (six) hours as needed for nausea or vomiting (up to 4 times a day prn).     No facility-administered medications prior to visit.    PAST MEDICAL HISTORY: Past Medical History  Diagnosis Date  . Hiatal hernia   . Hypertension   . Anemia   . GERD (gastroesophageal reflux disease)   . Hyperlipidemia   . COPD (chronic obstructive pulmonary disease)   . Depression     major  . Stroke     "mini stroke"  . Osteoarthritis   . Reactive airway disease   . B12 deficiency   . Osteoporosis   . Rupture of bowel   . Bladder incontinence   . DDD (degenerative disc disease), lumbar   . Cataract     PAST SURGICAL HISTORY: Past Surgical History  Procedure Laterality Date  . Abdominal surgery    . Colon surgery    .  Joint replacement    . Cholecystectomy    . Abdominal hysterectomy    . Appendectomy    . Bladder surgery    . Rectocele repair    . Ostomy      FAMILY HISTORY: Family History  Problem Relation Age of Onset  . Heart failure Mother   . Heart attack Father     SOCIAL HISTORY:  History   Social History  . Marital Status: Widowed    Spouse Name: N/A    Number of Children: 3  . Years of Education: middle sch   Occupational History  . retired     n/a   Social History Main Topics  . Smoking status: Current Some Day Smoker -- 1.50 packs/day for 30 years    Types: E-cigarettes  . Smokeless tobacco: Never Used     Comment: e cig  . Alcohol Use: No  . Drug Use: No  . Sexual Activity: No   Other Topics Concern  . Not on file   Social History Narrative   Patient lives at home with daughter.   Caffeine Use: 4 cups daily     PHYSICAL EXAM  Filed Vitals:   06/13/14 1013  BP: 151/83  Pulse: 59  Temp: 97.1 F (36.2 C)  TempSrc: Oral  Height: 5\' 5"  (1.651 m)    Weight: 188 lb 9.6 oz (85.548 kg)    Not recorded      Body mass index is 31.38 kg/(m^2).  No flowsheet data found.    GENERAL EXAM: Patient is in no distress; well developed, nourished and groomed; neck is supple  CARDIOVASCULAR: Regular rate and rhythm, no murmurs, no carotid bruits  NEUROLOGIC: MENTAL STATUS: awake, alert, language fluent, comprehension intact, naming intact, fund of knowledge appropriate CRANIAL NERVE: no papilledema on fundoscopic exam, pupils equal and reactive to light, visual fields full to confrontation, extraocular muscles intact, no nystagmus, facial sensation and strength symmetric, hearing intact, palate elevates symmetrically, uvula midline, shoulder shrug symmetric, tongue midline. MOTOR: normal bulk and tone, full strength in the BUE, BLE SENSORY: normal and symmetric to light touch, temperature, vibration COORDINATION: finger-nose-finger --> DYSMETRIA REFLEXES: deep tendon reflexes present and symmetric GAIT/STATION: UNSTEADY GAIT    DIAGNOSTIC DATA (LABS, IMAGING, TESTING) - I reviewed patient records, labs, notes, testing and imaging myself where available.  Lab Results  Component Value Date   WBC 5.1 07/30/2013   HGB 13.9 07/30/2013   HCT 41.2 07/30/2013   MCV 88.4 07/30/2013   PLT 165 07/30/2013      Component Value Date/Time   NA 145 07/30/2013 0630   K 3.1* 07/30/2013 0630   CL 105 07/30/2013 0630   CO2 27 07/30/2013 0630   GLUCOSE 86 07/30/2013 0630   BUN 10 07/30/2013 0630   CREATININE 1.17* 07/30/2013 0630   CALCIUM 9.2 07/30/2013 0630   PROT 8.4* 10/16/2012 2033   ALBUMIN 3.9 10/16/2012 2033   AST 158* 10/16/2012 2033   ALT 68* 10/16/2012 2033   ALKPHOS 99 10/16/2012 2033   BILITOT 0.5 10/16/2012 2033   GFRNONAA 46* 07/30/2013 0630   GFRAA 53* 07/30/2013 0630   No results found for: CHOL No results found for: HGBA1C No results found for: VITAMINB12 Lab Results  Component Value Date   TSH 1.351 07/30/2013     03/30/12 MRI BRAIN - 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 There mild changes of chronic microvascular ischemia and generalized cerebral atrophy.  03/30/12 MRA head - mild atheromatous  changes in the terminal branch vessels.  03/30/12 MRA neck - normal  06/11/13 MRI brain (without) demonstrating: 1. Few punctate foci of non-specific gliosis in the periventricular and subcortical white matter, likely chronic small vessel ischemic disease. 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 2. No acute findings.    ASSESSMENT AND PLAN  74 y.o. year old female here with constellation of issues/symptoms, likely related to increased stress and decreased PO intake. No clear unifying neurologic diagnosis.    "Dizziness" (balance difficulty + vertigo + syncope)  Memory loss - MMSE 28/30 in 05/03/13  Headaches  Depression  Anxiety  Poor PO intake  Gastroparesis    PLAN: - advised to focus on physical activity in daytime, proper sleep at night, small frequent / regular meals (currently eating once a day), increased water intake - consider psychiatry eval for depression/anxiety - consider cardiology evaluation of syncopal events; these do not sound like TIA, stroke or seizure. - no further neurologic testing advised  Return for return to PCP.    Penni Bombard, MD 02/08/7828, 56:21 AM Certified in Neurology, Neurophysiology and Neuroimaging  [facility] [address] [place], [place] [address] [phone]

## 2014-06-13 NOTE — Patient Instructions (Signed)
Increase water and food intake.  Eat small meals/snacks 3-4 per day.

## 2014-06-15 ENCOUNTER — Other Ambulatory Visit: Payer: Self-pay | Admitting: Internal Medicine

## 2014-06-15 ENCOUNTER — Ambulatory Visit
Admission: RE | Admit: 2014-06-15 | Discharge: 2014-06-15 | Disposition: A | Discharge status: Home / Self Care | Payer: Medicare Other | Source: Ambulatory Visit | Attending: Internal Medicine | Admitting: Internal Medicine

## 2014-06-15 DIAGNOSIS — R42 Dizziness and giddiness: Secondary | ICD-10-CM | POA: Insufficient documentation

## 2014-06-15 DIAGNOSIS — F17200 Nicotine dependence, unspecified, uncomplicated: Secondary | ICD-10-CM | POA: Diagnosis not present

## 2014-06-15 DIAGNOSIS — I10 Essential (primary) hypertension: Secondary | ICD-10-CM | POA: Insufficient documentation

## 2014-06-15 DIAGNOSIS — J449 Chronic obstructive pulmonary disease, unspecified: Secondary | ICD-10-CM

## 2014-06-15 DIAGNOSIS — F419 Anxiety disorder, unspecified: Secondary | ICD-10-CM | POA: Insufficient documentation

## 2014-06-15 DIAGNOSIS — E780 Pure hypercholesterolemia: Secondary | ICD-10-CM | POA: Diagnosis not present

## 2014-06-15 DIAGNOSIS — K9289 Other specified diseases of the digestive system: Secondary | ICD-10-CM | POA: Insufficient documentation

## 2014-06-15 DIAGNOSIS — Z1211 Encounter for screening for malignant neoplasm of colon: Secondary | ICD-10-CM | POA: Diagnosis not present

## 2014-06-15 DIAGNOSIS — J069 Acute upper respiratory infection, unspecified: Secondary | ICD-10-CM | POA: Insufficient documentation

## 2014-06-15 DIAGNOSIS — K219 Gastro-esophageal reflux disease without esophagitis: Secondary | ICD-10-CM | POA: Insufficient documentation

## 2014-06-15 DIAGNOSIS — M519 Unspecified thoracic, thoracolumbar and lumbosacral intervertebral disc disorder: Secondary | ICD-10-CM | POA: Diagnosis not present

## 2014-06-24 ENCOUNTER — Other Ambulatory Visit: Payer: Medicare Other

## 2014-06-27 ENCOUNTER — Other Ambulatory Visit: Payer: Medicare Other

## 2014-06-27 ENCOUNTER — Inpatient Hospital Stay
Admission: RE | Admit: 2014-06-27 | Discharge: 2014-06-27 | Disposition: A | Discharge status: Home / Self Care | Payer: Medicare Other | Source: Ambulatory Visit | Attending: Orthopedic Surgery | Admitting: Orthopedic Surgery

## 2014-06-27 NOTE — Discharge Instructions (Signed)
Myelogram Discharge Instructions  1. Go home and rest quietly for the next 24 hours.  It is important to lie flat for the next 24 hours.  Get up only to go to the restroom.  You may lie in the bed or on a couch on your back, your stomach, your left side or your right side.  You may have one pillow under your head.  You may have pillows between your knees while you are on your side or under your knees while you are on your back.  2. DO NOT drive today.  Recline the seat as far back as it will go, while still wearing your seat belt, on the way home.  3. You may get up to go to the bathroom as needed.  You may sit up for 10 minutes to eat.  You may resume your normal diet and medications unless otherwise indicated.  Drink lots of extra fluids today and tomorrow.  4. The incidence of headache, nausea, or vomiting is about 5% (one in 20 patients).  If you develop a headache, lie flat and drink plenty of fluids until the headache goes away.  Caffeinated beverages may be helpful.  If you develop severe nausea and vomiting or a headache that does not go away with flat bed rest, call [phone].  5. You may resume normal activities after your 24 hours of bed rest is over; however, do not exert yourself strongly or do any heavy lifting tomorrow. If when you get up you have a headache when standing, go back to bed and force fluids for another 24 hours.  6. Call your physician for a follow-up appointment.  The results of your myelogram will be sent directly to your physician by the following day.  7. If you have any questions or if complications develop after you arrive home, please call [phone].  Discharge instructions have been explained to the patient.  The patient, or the person responsible for the patient, fully understands these instructions.       May resume Celexa, Trazodone and Phenergan on Feb. 2, 2016, after 11:00 am.

## 2014-06-29 ENCOUNTER — Ambulatory Visit
Admission: RE | Admit: 2014-06-29 | Discharge: 2014-06-29 | Disposition: A | Discharge status: Home / Self Care | Payer: Medicare Other | Source: Ambulatory Visit | Attending: Orthopedic Surgery | Admitting: Orthopedic Surgery

## 2014-06-29 DIAGNOSIS — M545 Low back pain, unspecified: Secondary | ICD-10-CM

## 2014-06-29 DIAGNOSIS — M79604 Pain in right leg: Secondary | ICD-10-CM

## 2014-06-29 DIAGNOSIS — M48061 Spinal stenosis, lumbar region without neurogenic claudication: Secondary | ICD-10-CM

## 2014-06-29 NOTE — Discharge Instructions (Signed)
Myelogram Discharge Instructions  1. Go home and rest quietly for the next 24 hours.  It is important to lie flat for the next 24 hours.  Get up only to go to the restroom.  You may lie in the bed or on a couch on your back, your stomach, your left side or your right side.  You may have one pillow under your head.  You may have pillows between your knees while you are on your side or under your knees while you are on your back.  2. DO NOT drive today.  Recline the seat as far back as it will go, while still wearing your seat belt, on the way home.  3. You may get up to go to the bathroom as needed.  You may sit up for 10 minutes to eat.  You may resume your normal diet and medications unless otherwise indicated.  Drink lots of extra fluids today and tomorrow.  4. The incidence of headache, nausea, or vomiting is about 5% (one in 20 patients).  If you develop a headache, lie flat and drink plenty of fluids until the headache goes away.  Caffeinated beverages may be helpful.  If you develop severe nausea and vomiting or a headache that does not go away with flat bed rest, call [phone].  5. You may resume normal activities after your 24 hours of bed rest is over; however, do not exert yourself strongly or do any heavy lifting tomorrow. If when you get up you have a headache when standing, go back to bed and force fluids for another 24 hours.  6. Call your physician for a follow-up appointment.  The results of your myelogram will be sent directly to your physician by the following day.  7. If you have any questions or if complications develop after you arrive home, please call [phone].  Discharge instructions have been explained to the patient.  The patient, or the person responsible for the patient, fully understands these instructions.      May resume Celexa, Phenergan and Trazodone on Feb. 4, 2016, after 11:00 am.

## 2014-07-05 NOTE — Progress Notes (Signed)
Cardiology Office Note   Date:  07/05/2014   ID:  [name], DOB 07-28-1940, MRN [id number]  PCP:  Wenda Low, MD  Cardiologist:   Thayer Headings, MD   Chief Complaint  Patient presents with  . Dizziness   Problem List:   1. Essential Hypertension 2. Hyperlipidemia 3. COPD 4.  Dizziness  5. TIAs - 2 separate strokes.  6. Vertigo    History of Present Illness: [name] is a 74 y.o. female who presents for chest pain . Heaviness, not every day,  Associated  With stress.  Or activity such as bringing in groceries.   Also has a hiatal hernia Center of chest, does not radiate. Improves when she drinks something.   Does not get any regular exercise.  Smokes, now uses an e- cig.     Past Medical History  Diagnosis Date  . Hiatal hernia   . Hypertension   . Anemia   . GERD (gastroesophageal reflux disease)   . Hyperlipidemia   . COPD (chronic obstructive pulmonary disease)   . Depression     major  . Stroke     "mini stroke"  . Osteoarthritis   . Reactive airway disease   . B12 deficiency   . Osteoporosis   . Rupture of bowel   . Bladder incontinence   . DDD (degenerative disc disease), lumbar   . Cataract     Past Surgical History  Procedure Laterality Date  . Abdominal surgery    . Colon surgery    . Joint replacement    . Cholecystectomy    . Abdominal hysterectomy    . Appendectomy    . Bladder surgery    . Rectocele repair    . Ostomy       Current Outpatient Prescriptions  Medication Sig Dispense Refill  . ALPRAZolam (XANAX) 1 MG tablet Take 1 mg by mouth at bedtime.    Marland Kitchen amLODipine (NORVASC) 5 MG tablet Take 1 tablet by mouth daily.    Marland Kitchen aspirin 81 MG tablet Take 81 mg by mouth daily.    . capsicum (ZOSTRIX) 0.075 % topical cream Apply 1 application topically 3 (three) times daily.    . citalopram (CELEXA) 40 MG tablet Take 40 mg by mouth daily. Patient take a half of tab daily    . dicyclomine (BENTYL) 20 MG tablet Take  20 mg by mouth 3 (three) times daily as needed for spasms (1 tablet 3 times a day prn).    Marland Kitchen esomeprazole (NEXIUM) 40 MG capsule Take 40 mg by mouth 2 (two) times daily.    Marland Kitchen HYDROcodone-acetaminophen (VICODIN) 5-500 MG per tablet Take 1 tablet by mouth every 6 (six) hours as needed. For pain    . ibuprofen (ADVIL,MOTRIN) 200 MG tablet Take 600 mg by mouth daily as needed for pain.    Marland Kitchen lovastatin (MEVACOR) 10 MG tablet Take 10 mg by mouth daily with lunch.    . meclizine (ANTIVERT) 12.5 MG tablet Take 12.5 mg by mouth 3 (three) times daily as needed for nausea.    . ondansetron (ZOFRAN) 4 MG tablet Take 1 tablet (4 mg total) by mouth every 6 (six) hours. (Patient not taking: Reported on 06/13/2014) 18 tablet 0  . polyethylene glycol powder (GLYCOLAX/MIRALAX) powder Take 17 g by mouth daily. For constipation    . promethazine (PHENERGAN) 25 MG suppository Place 25 mg rectally every 6 (six) hours as needed for nausea or vomiting.    . senna (  SENOKOT) 8.6 MG tablet Take 1 tablet by mouth daily.    . Soft Lens Products (RA REWETTING DROPS) SOLN 1 application by Does not apply route daily.    Marland Kitchen tiZANidine (ZANAFLEX) 4 MG capsule Take 2 mg by mouth as needed for muscle spasms (1/2 tab at bedtime prn).    . traZODone (DESYREL) 50 MG tablet Take 0.5 tablets (25 mg total) by mouth at bedtime. 30 tablet 3  . vitamin B-12 (CYANOCOBALAMIN) 1000 MCG tablet Take 1,000 mcg by mouth daily.     No current facility-administered medications for this visit.    Allergies:   Ambien; Ampicillin-sulbactam sodium; Oxycontin; Percocet; Toradol; and Tramadol    Social History:  The patient  reports that she has quit smoking. She has never used smokeless tobacco. She reports that she does not drink alcohol or use illicit drugs.   Family History:  The patient's family history includes Heart attack in her father; Heart failure in her mother.    ROS:  Please see the history of present illness.    Review of  Systems: Constitutional:  denies fever, chills, diaphoresis, appetite change and fatigue.  HEENT: denies photophobia, eye pain, redness, hearing loss, ear pain, congestion, sore throat, rhinorrhea, sneezing, neck pain, neck stiffness and tinnitus.  Respiratory: admits to SOB, DOE, cough, chest tightness, and wheezing.  Cardiovascular: admits to chest pain,    Gastrointestinal: denies nausea, vomiting, abdominal pain, diarrhea, constipation, blood in stool.  Genitourinary: denies dysuria, urgency, frequency, hematuria, flank pain and difficulty urinating.  Musculoskeletal: denies  myalgias, back pain, joint swelling, arthralgias and gait problem.   Skin: denies pallor, rash and wound.  Neurological: denies dizziness, seizures, syncope, weakness, light-headedness, numbness and headaches.   Hematological: denies adenopathy, easy bruising, personal or family bleeding history.  Psychiatric/ Behavioral: denies suicidal ideation, mood changes, confusion, nervousness, sleep disturbance and agitation.       All other systems are reviewed and negative.    PHYSICAL EXAM: VS:  There were no vitals taken for this visit. , BMI There is no weight on file to calculate BMI. GEN: Well nourished, well developed, in no acute distress HEENT: normal Neck: no JVD, carotid bruits, or masses Cardiac: RRR; no murmurs, rubs, or gallops,no edema  Respiratory:  She has rales bilaterally  GI: soft, nontender, nondistended, + BS MS: no deformity or atrophy Skin: warm and dry, no rash Neuro:  Strength and sensation are intact Psych: normal   EKG:  EKG is ordered today. The ekg ordered today demonstrates NSR at 61, low voltage QRS.    Recent Labs: 07/29/2013: Pro B Natriuretic peptide (BNP) 124.5 07/30/2013: BUN 10; Creatinine 1.17*; Hemoglobin 13.9; Platelets 165; Potassium 3.1*; Sodium 145; TSH 1.351    Lipid Panel No results found for: CHOL, TRIG, HDL, CHOLHDL, VLDL, LDLCALC, LDLDIRECT    Wt Readings  from Last 3 Encounters:  06/13/14 188 lb 9.6 oz (85.548 kg)  03/28/14 186 lb (84.369 kg)  10/19/13 194 lb (87.998 kg)      Other studies Reviewed: Additional studies/ records that were reviewed today include: . Review of the above records demonstrates:    ASSESSMENT AND PLAN:  1. Chest pressure  -chronic presents today with complaints of chest pressure. These episodes of chest pressure currently is times. There is social with some shortness of breath. There is no radiation.   These episodes typically worsened with exertion and relieved when she stops to rest. She has a history of hypertension, hyperlipidemia, and cigarette smoking. She has some  nonspecific EKG changes on her EKG and I'm concerned that she may be having symptoms of angina. We will schedule her for a [place] study for further evaluation.  2. Essential Hypertension - she still eats a very high salt diet. She did not realize that she was eating lots of salt but she eats lots of prepared foods and lots of fatty foods. I've given her some guidelines on low-salt alternatives.  3. Hyperlipidemia - managed by her medical doctor. 4.  Dizziness - she has symptoms of vertigo. 5. TIAs - 2 separate strokes.  6. Vertigo 7. COPD - she has rales on exam , she has been seeing her medical doctor.  I've encouraged her to see them again for further eval.  8. Hiatal hernia - she is a very poor diet. It's possible that her symptoms are due to hiatal hernia and gastroesophageal reflux pain. We spent a good bit of time talking about and improved diet. 9.  Current medicines are reviewed at length with the patient today.  The patient does not have concerns regarding medicines.  The following changes have been made:  no change   Disposition:   FU with me in 3 months     Signed, Nahser, Wonda Cheng, MD  07/05/2014 8:40 PM    [facility] [place], [place], [place]  [address] Phone: [phone]; Fax: [phone]

## 2014-07-06 ENCOUNTER — Ambulatory Visit (INDEPENDENT_AMBULATORY_CARE_PROVIDER_SITE_OTHER): Payer: Medicare Other | Admitting: Cardiovascular Disease

## 2014-07-06 ENCOUNTER — Encounter: Payer: Self-pay | Admitting: Cardiovascular Disease

## 2014-07-06 VITALS — BP 128/82 | HR 61 | Ht 65.0 in | Wt 186.8 lb

## 2014-07-06 DIAGNOSIS — I10 Essential (primary) hypertension: Secondary | ICD-10-CM

## 2014-07-06 DIAGNOSIS — E785 Hyperlipidemia, unspecified: Secondary | ICD-10-CM | POA: Diagnosis not present

## 2014-07-06 DIAGNOSIS — R0789 Other chest pain: Secondary | ICD-10-CM | POA: Diagnosis not present

## 2014-07-06 NOTE — Patient Instructions (Signed)
Your physician recommends that you continue on your current medications as directed. Please refer to the Current Medication list given to you today.  Your physician has requested that you have a lexiscan myoview. For further information please visit [email or web address]. Please follow instruction sheet, as given.  Your physician recommends that you schedule a follow-up appointment in: 3 months with Dr. Acie Fredrickson

## 2014-07-15 ENCOUNTER — Encounter (HOSPITAL_COMMUNITY): Payer: Medicare Other

## 2014-07-20 ENCOUNTER — Ambulatory Visit (HOSPITAL_COMMUNITY): Payer: Medicare Other | Attending: Cardiovascular Disease | Admitting: Radiology

## 2014-07-20 DIAGNOSIS — R002 Palpitations: Secondary | ICD-10-CM | POA: Diagnosis not present

## 2014-07-20 DIAGNOSIS — R0789 Other chest pain: Secondary | ICD-10-CM | POA: Diagnosis not present

## 2014-07-20 DIAGNOSIS — I10 Essential (primary) hypertension: Secondary | ICD-10-CM | POA: Diagnosis not present

## 2014-07-20 DIAGNOSIS — R55 Syncope and collapse: Secondary | ICD-10-CM | POA: Diagnosis not present

## 2014-07-20 DIAGNOSIS — R079 Chest pain, unspecified: Secondary | ICD-10-CM | POA: Diagnosis not present

## 2014-07-20 DIAGNOSIS — J449 Chronic obstructive pulmonary disease, unspecified: Secondary | ICD-10-CM | POA: Insufficient documentation

## 2014-07-20 MED ORDER — AMINOPHYLLINE 25 MG/ML IV SOLN
75.0000 mg | Freq: Once | INTRAVENOUS | Status: AC
  Administered 2014-07-20: 75 mg via INTRAVENOUS

## 2014-07-20 MED ORDER — TECHNETIUM TC 99M SESTAMIBI GENERIC - CARDIOLITE
10.0000 | Freq: Once | INTRAVENOUS | Status: AC | PRN
  Administered 2014-07-20: 10 via INTRAVENOUS

## 2014-07-20 MED ORDER — TECHNETIUM TC 99M SESTAMIBI GENERIC - CARDIOLITE
30.0000 | Freq: Once | INTRAVENOUS | Status: AC | PRN
  Administered 2014-07-20: 30 via INTRAVENOUS

## 2014-07-20 MED ORDER — REGADENOSON 0.4 MG/5ML IV SOLN
0.4000 mg | Freq: Once | INTRAVENOUS | Status: AC
  Administered 2014-07-20: 0.4 mg via INTRAVENOUS

## 2014-07-20 NOTE — Progress Notes (Signed)
[facility] [address] [place], [place] [address] [phone]    Cardiology Nuclear Med Study  [name] is a 74 y.o. female     MRN : [id number]     DOB: Jun 09, 1940  Procedure Date: 07/20/2014  Nuclear Med Background Indication for Stress Test:  Evaluation for Ischemia History:  COPD Cardiac Risk Factors: Hypertension  Symptoms:  Chest Pain (last date of chest discomfort was yesterday), Palpitations and Syncope   Nuclear Pre-Procedure Caffeine/Decaff Intake:  None NPO After: 3pm   Lungs:  clear O2 Sat: 93% on room air. IV 0.9% NS with Angio Cath:  22g  IV Site: R Hand  IV Started by:  Crissie Figures, RN  Chest Size (in):  36 Cup Size: D  Height: 5\' 5"  (1.651 m)  Weight:  184 lb (83.462 kg)  BMI:  Body mass index is 30.62 kg/(m^2). Tech Comments:  N/A    Nuclear Med Study 1 or 2 day study: 1 day  Stress Test Type:  Lexiscan  Reading MD: N/A  Order Authorizing Provider:  Mertie Moores, MD  Resting Radionuclide: Technetium 23m Sestamibi  Resting Radionuclide Dose: 11.0 mCi   Stress Radionuclide:  Technetium 72m Sestamibi  Stress Radionuclide Dose: 33.0 mCi           Stress Protocol Rest HR: 65 Stress HR: 96  Rest BP: 187/83 Stress BP: 169/90  Exercise Time (min): n/a METS: n/a   Predicted Max HR: 148 bpm % Max HR: 64.86 bpm Rate Pressure Product: 16608   Dose of Adenosine (mg):  n/a Dose of Lexiscan: 0.4 mg  Dose of Atropine (mg): n/a Dose of Dobutamine: n/a mcg/kg/min (at max HR)  Stress Test Technologist: Glade Lloyd, BS-ES  Nuclear Technologist:  Earl Many, CNMT     Rest Procedure:  Myocardial perfusion imaging was performed at rest 45 minutes following the intravenous administration of Technetium 38m Sestamibi. Rest ECG: NSR - Normal EKG  Stress Procedure:  The patient received IV Lexiscan 0.4 mg over 15-seconds.  Technetium 16m Sestamibi injected at 30-seconds.  Quantitative spect images were obtained after a 45  minute delay.  During the infusion of Lexiscan the patient complained of SOB, chest tightness, head feeling full and anxiousness.  These symptoms slowly began to resolve in recovery.  Approximately 30 minutes into recovery the patient complained of being nauseated.  She walked down the hall and lowered herself to the floor due feeling very lightheaded and woozy as stated by the patient (no signs of injury or complaints from the patient).  Had the patient lie down on the bed and administered 75 mg aminophylline for her nausea which subsided relatively quickly.  A 12 lead EKG showed NSR with PAC's and a couple of slight pauses.  Her BP was lower than when she completed the test at 127/72 and then again 117/68.   Had patient sit in a chair with her legs elevated.  After 15 minutes or so we had the patient stand and her HR continued to climb to 129 BPM at one point.  Patient continued to be monitored with vital signs and EKG until time to go under the camera.  Once patient went under the camera for her stress images her heart rate lying down was 78 BPM and asymptomtic. Discussed with Dr. Aundra Dubin and okay for her to leave.   Stress ECG: No significant change from baseline ECG  QPS Raw Data Images:  Normal; no motion artifact; normal heart/lung ratio.  Stress Images:  Normal homogeneous uptake in all areas of the myocardium. Rest Images:  Normal homogeneous uptake in all areas of the myocardium. Subtraction (SDS):  No evidence of ischemia. Transient Ischemic Dilatation (Normal <1.22):  0.85 Lung/Heart Ratio (Normal <0.45):  0.29  Quantitative Gated Spect Images QGS EDV:  72 ml QGS ESV:  24 ml  Impression Exercise Capacity:  Lexiscan with no exercise. BP Response:  Normal blood pressure response. Clinical Symptoms:  No significant symptoms noted. ECG Impression:  No significant ST segment change suggestive of ischemia. Comparison with Prior Nuclear Study: No significant change from previous study form  07/30/13.  Overall Impression:  Normal stress nuclear study.  No ischemia.  LV Ejection Fraction: 67%.  LV Wall Motion:  NL LV Function; NL Wall Motion.    Thayer Headings, Brooke Bonito., MD, [facility] 07/20/2014, 4:35 PM [address]  [place] Pager [phone]

## 2014-07-21 ENCOUNTER — Telehealth: Payer: Self-pay | Admitting: Cardiovascular Disease

## 2014-07-21 NOTE — Telephone Encounter (Signed)
Left message for patient to call me back. 

## 2014-07-21 NOTE — Telephone Encounter (Signed)
Follow Up  Pt called back for Stress Test results//sr

## 2014-07-22 IMAGING — CT CT ABD-PELV W/ CM
2 of 4 series · 13 of 36 positions shown, 18 images · IV contrast (APPLIED)
Comparison: CT of the abdomen and pelvis performed 09/11/2010

CLINICAL DATA: Abdominal pain, nausea, vomiting and diarrhea.

CT ABDOMEN AND PELVIS WITH CONTRAST
TECHNIQUE: Multidetector CT imaging of the abdomen and pelvis was
performed following the standard protocol during bolus
administration of intravenous contrast.
Contrast: 100mL OMNIPAQUE IOHEXOL 300 MG/ML  SOLN

[Series 2: abd/pelv with 5.0 b31f st · axial · 0.81mm/px · z∈[-398,+2]mm · 12 of 90 slices shown, 16 images]
[im 5/90  soft-tissue]
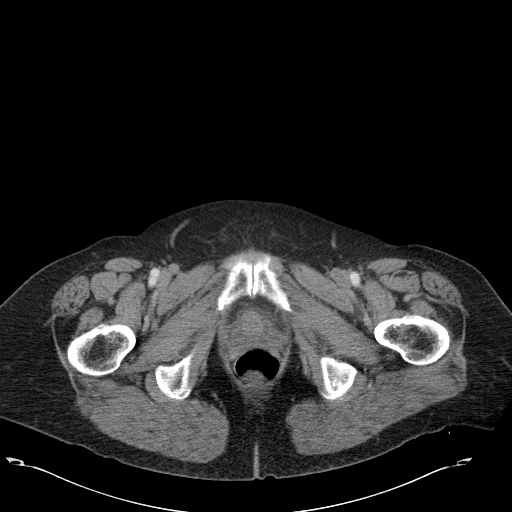
[im 5/90  bone]
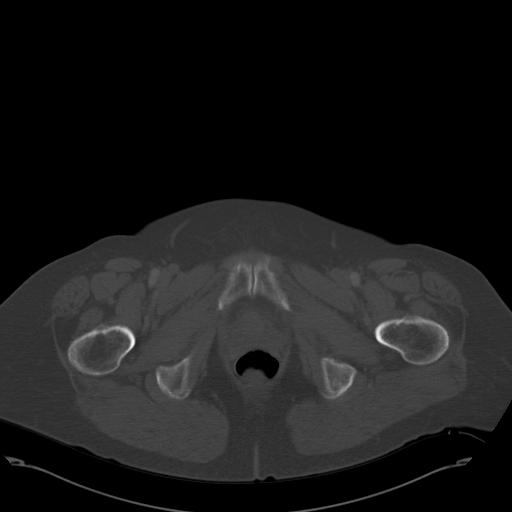
[im 14/90  soft-tissue]
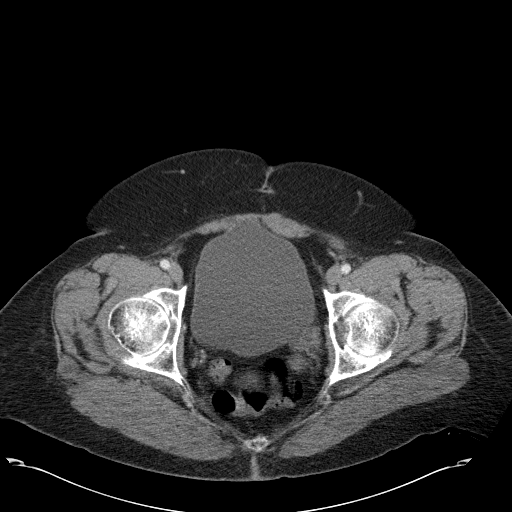
[im 23/90  soft-tissue]
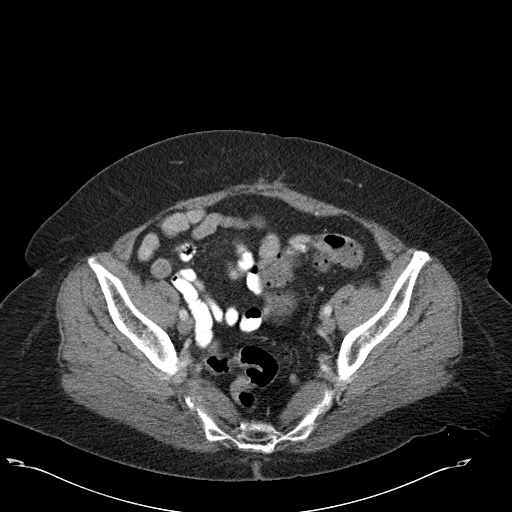
[im 32/90  soft-tissue]
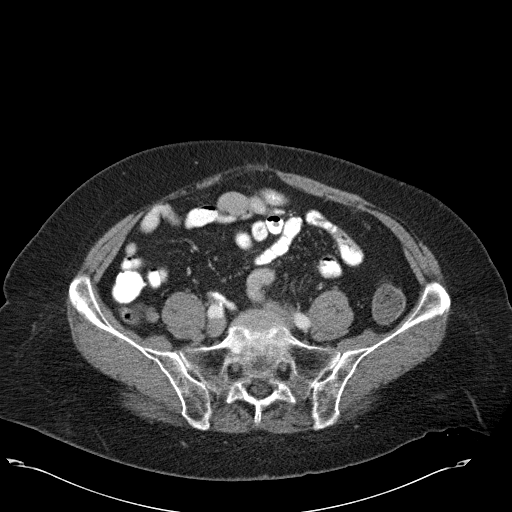
[im 41/90  soft-tissue]
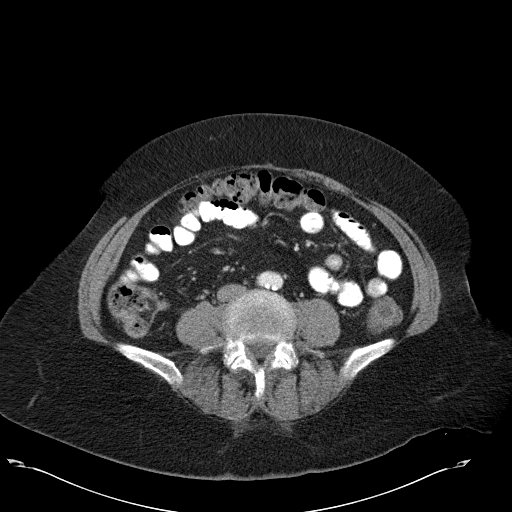
[im 49/90  soft-tissue]
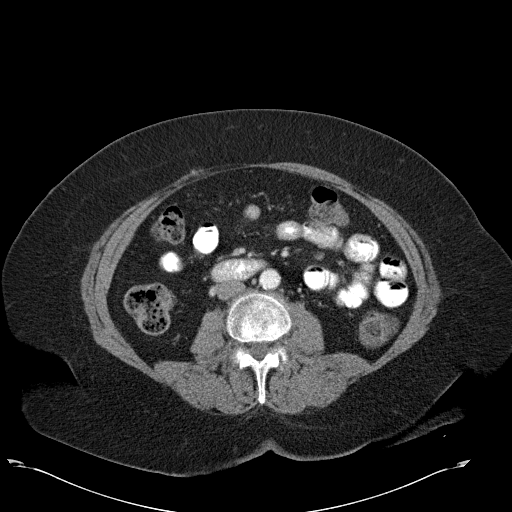
[im 58/90  soft-tissue]
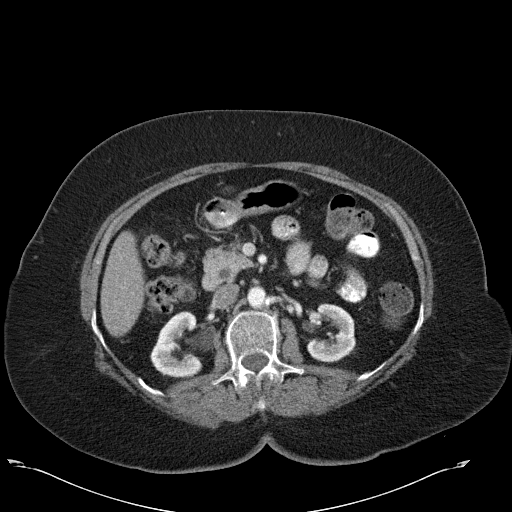
[im 67/90  soft-tissue]
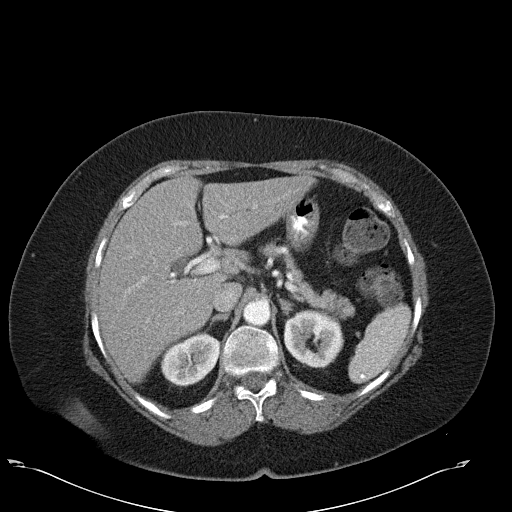
[im 72/90  lung]
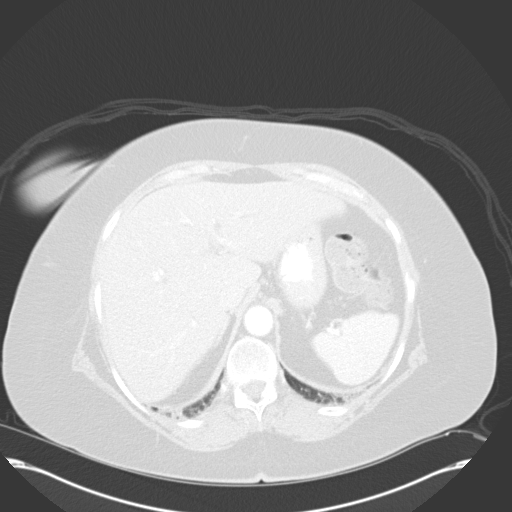
[im 76/90  soft-tissue]
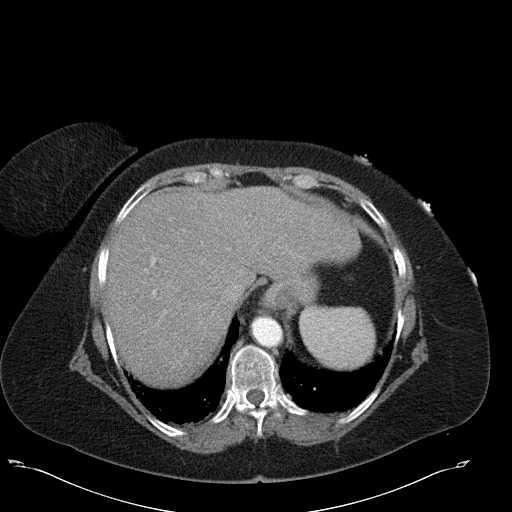
[im 76/90  lung]
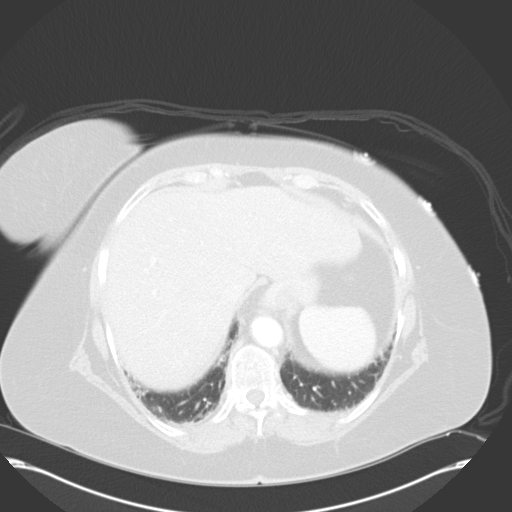
[im 76/90  bone]
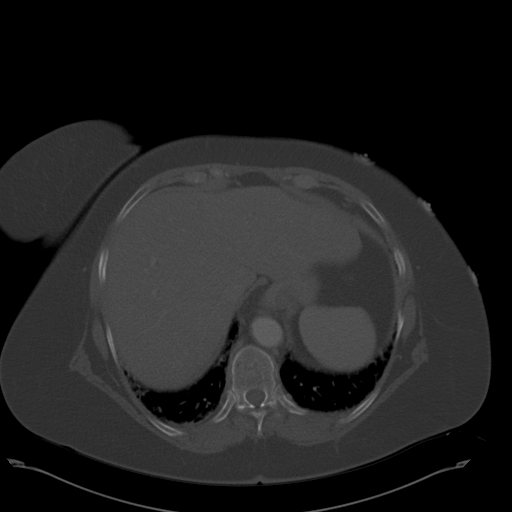
[im 81/90  lung]
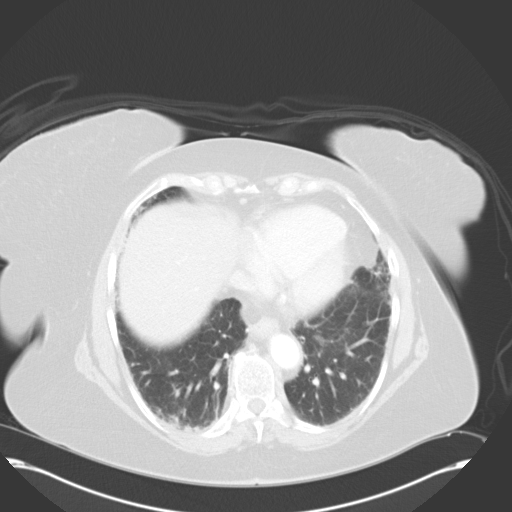
[im 85/90  soft-tissue]
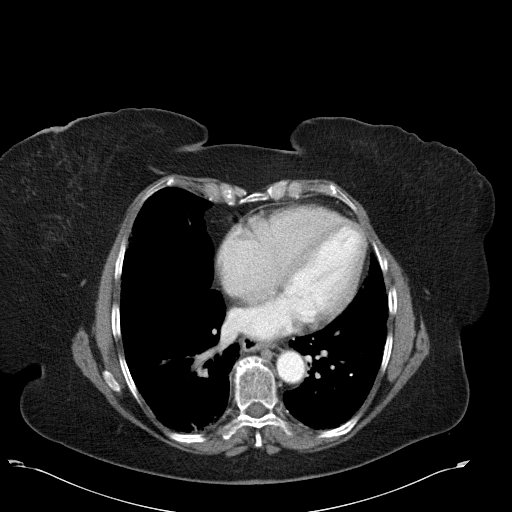
[im 85/90  lung]
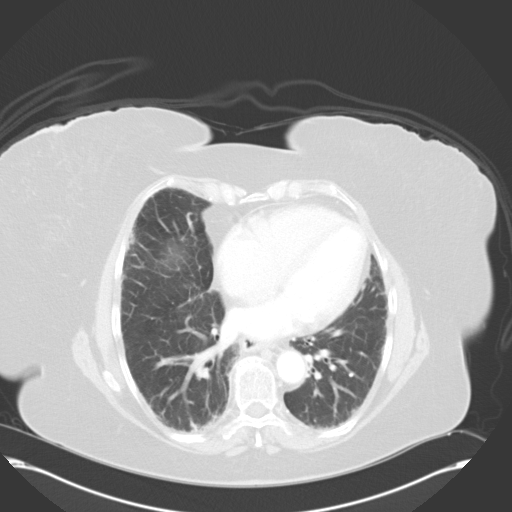

[Series 602: <mpr thick sagittals · sagittal · 0.88mm/px · 1 of 101 slices shown, 2 images]
[im 34/101  soft-tissue]
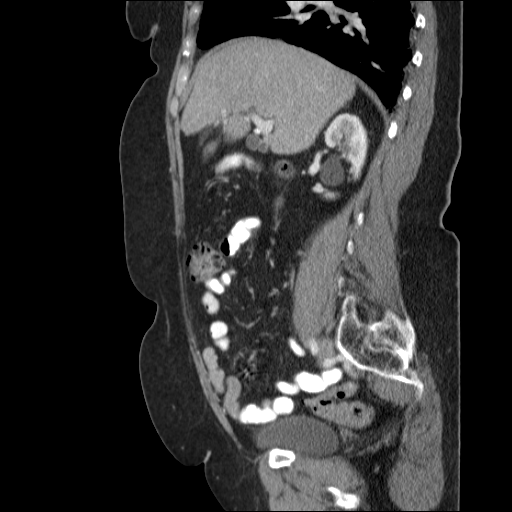
[im 34/101  bone]
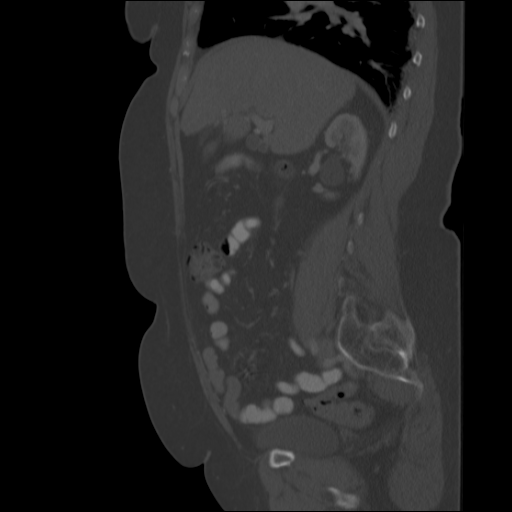

[13 of 36 positions shown; findings below may reference images not displayed]

FINDINGS: Scarring is noted at the lung bases.  A tiny hiatal
hernia is seen.

The liver and spleen are unremarkable in appearance.  The common
hepatic duct is mildly distended, measuring 1.5 cm; this is thought
to remain within normal limits status post cholecystectomy.  The
gallbladder is within normal limits.  The pancreas and adrenal
glands are unremarkable.

A focal calcification at the left renal hilum may reflect a small
renal artery aneurysm.  Scattered small bilateral renal cysts are
seen.  Minimal nonspecific perinephric stranding is noted
bilaterally.  The kidneys are otherwise unremarkable in appearance.

No free fluid is identified.  The small bowel is unremarkable in
appearance.  The stomach is otherwise within normal limits.  No
acute vascular abnormalities are seen.  Mild scattered
calcification is noted along the abdominal aorta and its branches.

A small anterior abdominal wall hernia is noted inferior and to the
right of the umbilicus, containing only fat.

The patient is status post appendectomy.  The colon is unremarkable
in appearance.

The bladder is moderately distended and grossly unremarkable in
appearance.  The prostate remains normal in size.  No inguinal
lymphadenopathy is seen.

No acute osseous abnormalities are identified.  There is slight
chronic loss of height at the lower lumbar spine.
IMPRESSION: 1.  No acute abnormality seen within the abdomen or pelvis.
2.  Tiny hiatal hernia seen.
3.  Small anterior abdominal wall hernia and inferior and to the
right of the umbilicus, containing only fat.
4.  Scarring noted at the lung bases.
5.  Scattered small bilateral renal cysts seen.

## 2014-07-22 NOTE — Telephone Encounter (Signed)
Stress test results reviewed with patient who verbalized understanding

## 2014-07-25 ENCOUNTER — Ambulatory Visit
Admission: RE | Admit: 2014-07-25 | Discharge: 2014-07-25 | Disposition: A | Discharge status: Home / Self Care | Payer: Medicare Other | Source: Ambulatory Visit | Attending: Nurse Practitioner | Admitting: Nurse Practitioner

## 2014-07-25 ENCOUNTER — Other Ambulatory Visit: Payer: Self-pay | Admitting: Nurse Practitioner

## 2014-07-25 DIAGNOSIS — R05 Cough: Secondary | ICD-10-CM | POA: Diagnosis not present

## 2014-07-25 DIAGNOSIS — J40 Bronchitis, not specified as acute or chronic: Secondary | ICD-10-CM

## 2014-07-25 DIAGNOSIS — S0990XA Unspecified injury of head, initial encounter: Secondary | ICD-10-CM

## 2014-07-25 DIAGNOSIS — W19XXXA Unspecified fall, initial encounter: Secondary | ICD-10-CM

## 2014-07-25 DIAGNOSIS — Y92009 Unspecified place in unspecified non-institutional (private) residence as the place of occurrence of the external cause: Secondary | ICD-10-CM

## 2014-07-25 DIAGNOSIS — J209 Acute bronchitis, unspecified: Secondary | ICD-10-CM | POA: Diagnosis not present

## 2014-08-05 ENCOUNTER — Ambulatory Visit
Admission: RE | Admit: 2014-08-05 | Discharge: 2014-08-05 | Disposition: A | Discharge status: Home / Self Care | Payer: Medicare Other | Source: Ambulatory Visit | Attending: Internal Medicine | Admitting: Internal Medicine

## 2014-08-05 ENCOUNTER — Other Ambulatory Visit: Payer: Self-pay | Admitting: Internal Medicine

## 2014-08-05 DIAGNOSIS — M25512 Pain in left shoulder: Secondary | ICD-10-CM

## 2014-08-05 DIAGNOSIS — M19012 Primary osteoarthritis, left shoulder: Secondary | ICD-10-CM | POA: Diagnosis not present

## 2014-08-16 DIAGNOSIS — H3531 Nonexudative age-related macular degeneration: Secondary | ICD-10-CM | POA: Diagnosis not present

## 2014-08-16 DIAGNOSIS — H02839 Dermatochalasis of unspecified eye, unspecified eyelid: Secondary | ICD-10-CM | POA: Diagnosis not present

## 2014-08-16 DIAGNOSIS — Z961 Presence of intraocular lens: Secondary | ICD-10-CM | POA: Diagnosis not present

## 2014-08-16 DIAGNOSIS — H18411 Arcus senilis, right eye: Secondary | ICD-10-CM | POA: Diagnosis not present

## 2014-08-18 IMAGING — MR MR HIP*R* W/O CM
4 of 5 series · 18 of 40 positions shown · non-contrast
Comparison: CT scan of the abdomen and pelvis dated 10/17/2012

CLINICAL DATA: Right hip pain since a fall 2 months ago.

MRI OF THE RIGHT HIP WITHOUT CONTRAST
TECHNIQUE: Multiplanar, multisequence MR imaging was performed. No
intravenous contrast was administered.

[Series 2: T1 · coronal · 4.0mm · 0.49mm/px · 3 of 18 slices shown (1 of 2)]
[im 3/18]
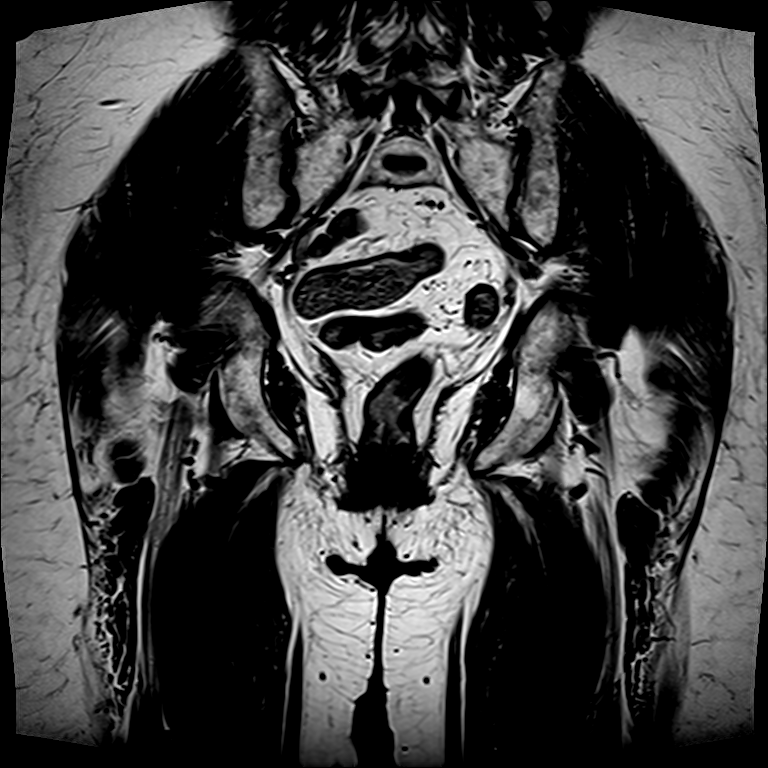
[im 10/18]
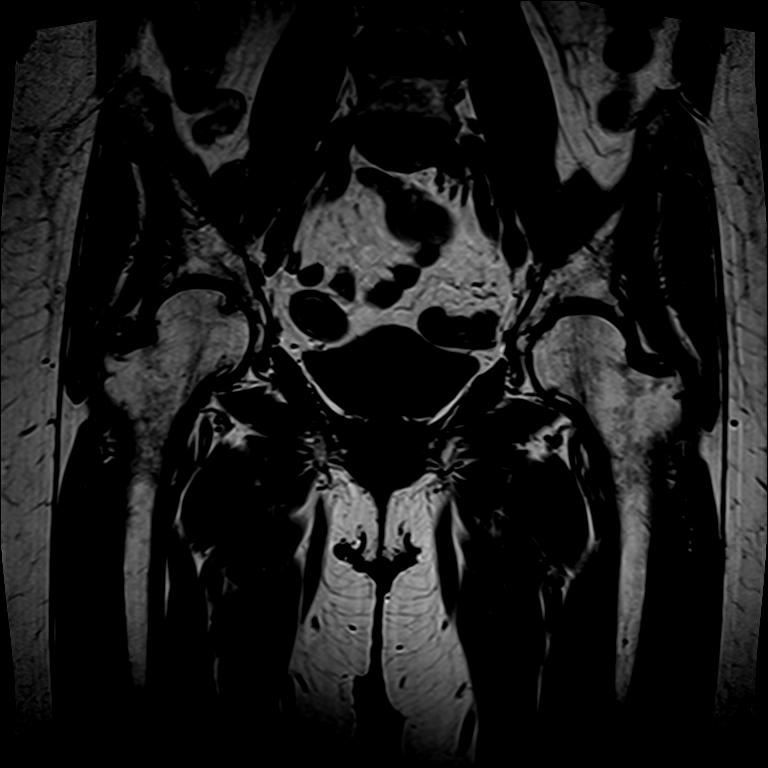
[im 15/18]
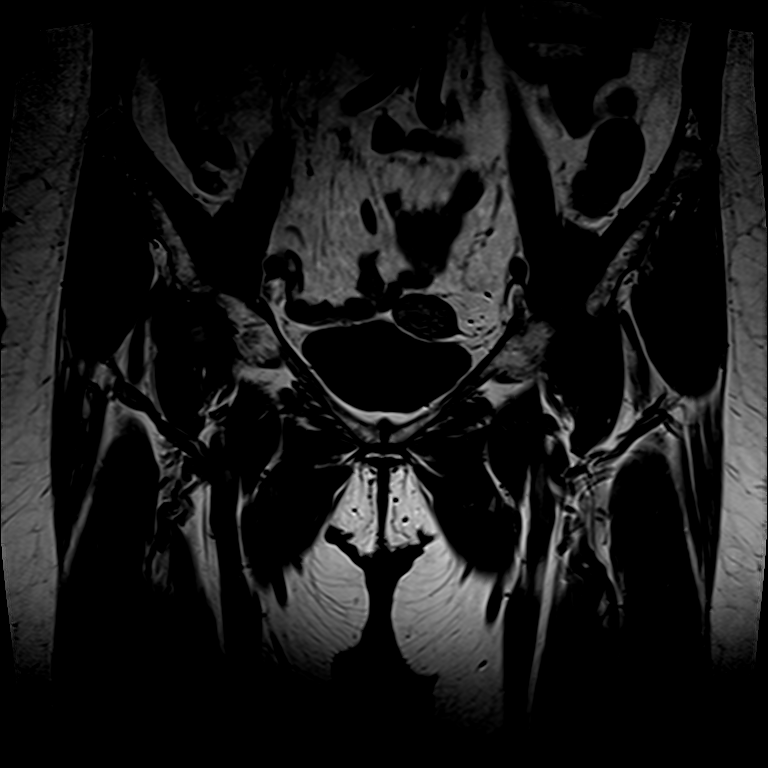

[Series 4: T2 fat-sat · axial · 4.0mm · 0.48mm/px · z∈[-41,+54]mm · 5 of 24 slices shown]
[im 1/24]
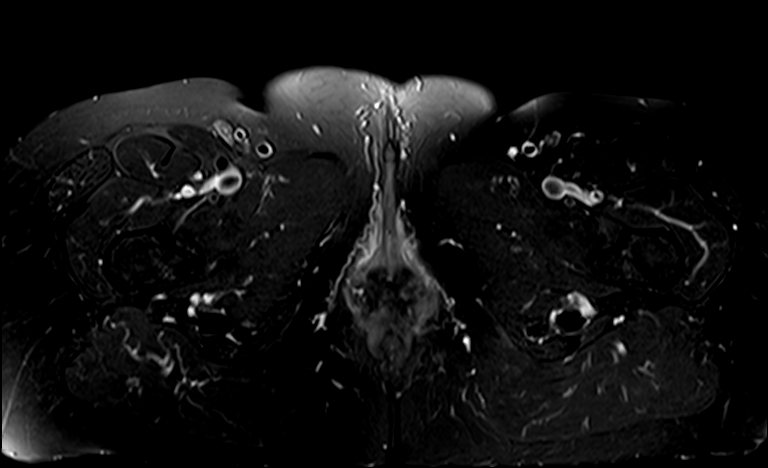
[im 3/24]
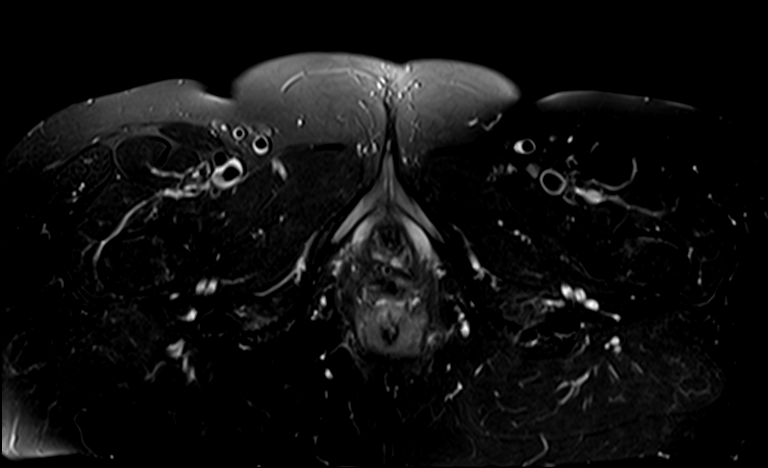
[im 6/24]
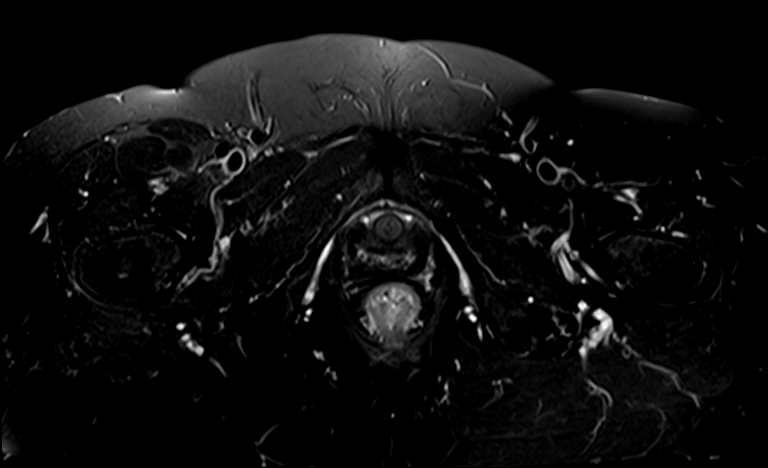
[im 12/24]
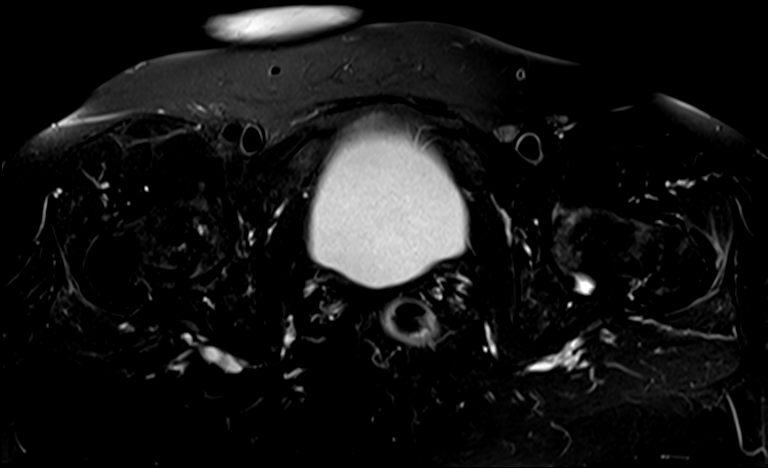
[im 21/24]
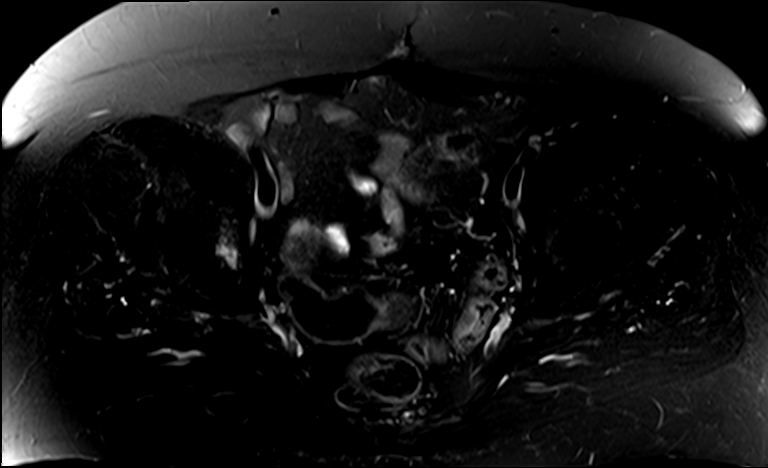

[Series 5: T1 · axial · 4.0mm · 0.48mm/px · z∈[-32,+54]mm · 3 of 24 slices shown (2 of 2)]
[im 3/24]
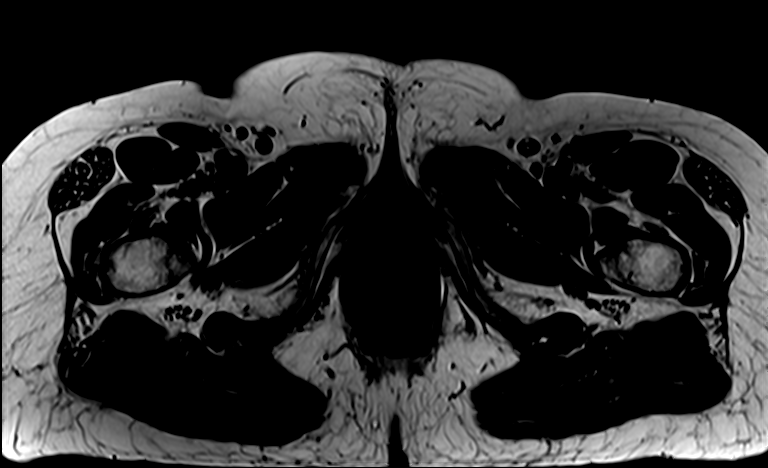
[im 12/24]
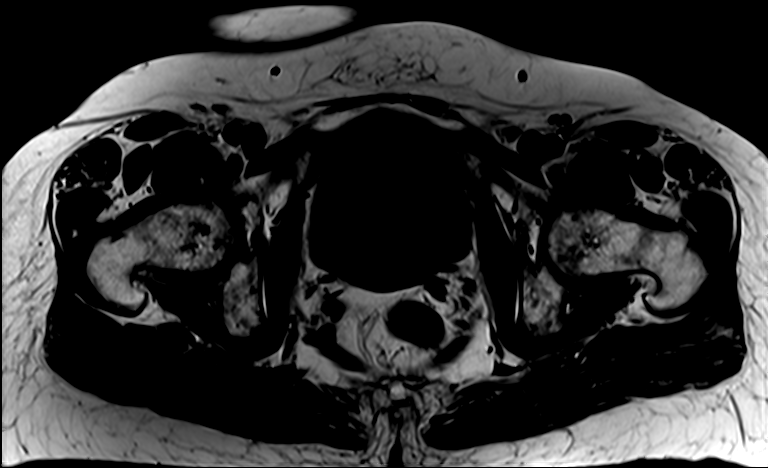
[im 21/24]
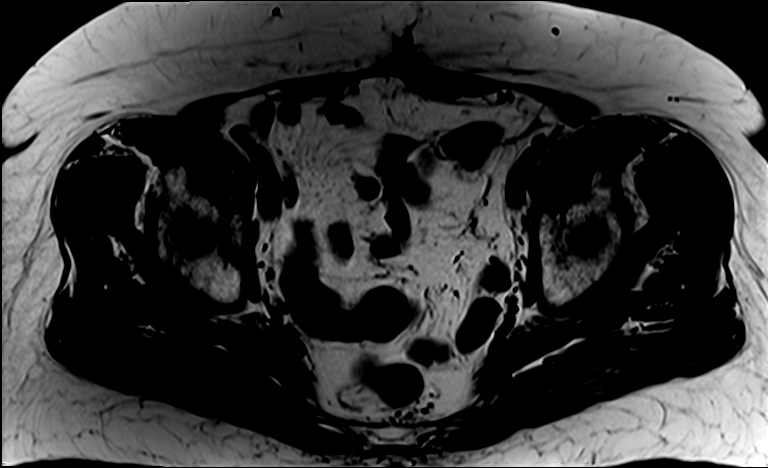

[Series 6: PD · sagittal · 4.0mm · 0.70mm/px · 7 of 19 slices shown]
[im 1/19]
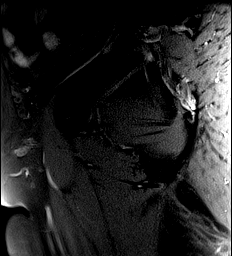
[im 4/19]
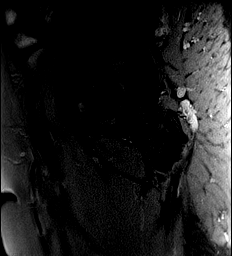
[im 7/19]
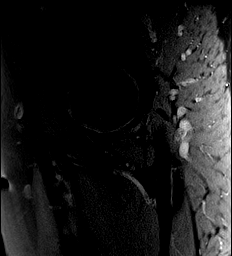
[im 10/19]
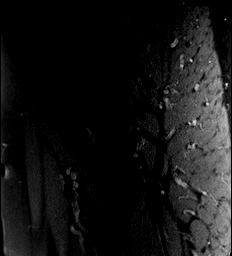
[im 13/19]
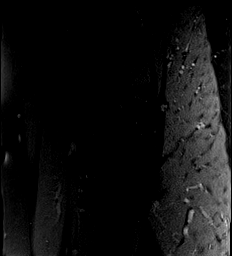
[im 16/19]
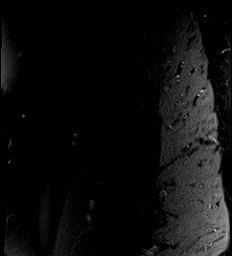
[im 19/19]
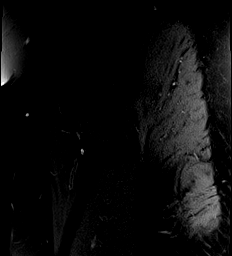

[18 of 40 positions shown; findings below may reference images not displayed]

FINDINGS: There is moderate osteoarthritis of the right hip with
diffuse joint space narrowing with subchondral cysts in the
acetabulum and femoral head.  There is also thickening of the
synovium of the right hip as compared to the left.  There is no hip
effusion, however.

The adjacent soft tissues are normal with no bursitis or muscle
atrophy or edema.  The visualized portion of the pelvis is normal.
IMPRESSION: 1.  Moderate osteoarthritis of the right hip with synovial
hypertrophy consistent with chronic inflammation.
2.  No acute osseous abnormality.

## 2014-08-24 DIAGNOSIS — J449 Chronic obstructive pulmonary disease, unspecified: Secondary | ICD-10-CM | POA: Diagnosis not present

## 2014-09-20 ENCOUNTER — Other Ambulatory Visit: Payer: Self-pay | Admitting: Internal Medicine

## 2014-09-20 DIAGNOSIS — I10 Essential (primary) hypertension: Secondary | ICD-10-CM | POA: Diagnosis not present

## 2014-09-20 DIAGNOSIS — E780 Pure hypercholesterolemia: Secondary | ICD-10-CM | POA: Diagnosis not present

## 2014-09-20 DIAGNOSIS — M519 Unspecified thoracic, thoracolumbar and lumbosacral intervertebral disc disorder: Secondary | ICD-10-CM | POA: Diagnosis not present

## 2014-09-20 DIAGNOSIS — F324 Major depressive disorder, single episode, in partial remission: Secondary | ICD-10-CM | POA: Diagnosis not present

## 2014-09-20 DIAGNOSIS — N183 Chronic kidney disease, stage 3 (moderate): Secondary | ICD-10-CM | POA: Diagnosis not present

## 2014-09-20 DIAGNOSIS — J449 Chronic obstructive pulmonary disease, unspecified: Secondary | ICD-10-CM | POA: Diagnosis not present

## 2014-09-20 DIAGNOSIS — K219 Gastro-esophageal reflux disease without esophagitis: Secondary | ICD-10-CM | POA: Diagnosis not present

## 2014-09-20 DIAGNOSIS — R1011 Right upper quadrant pain: Secondary | ICD-10-CM | POA: Diagnosis not present

## 2014-09-20 DIAGNOSIS — G8929 Other chronic pain: Secondary | ICD-10-CM | POA: Diagnosis not present

## 2014-09-21 ENCOUNTER — Other Ambulatory Visit: Payer: Medicare Other

## 2014-09-22 ENCOUNTER — Emergency Department (HOSPITAL_COMMUNITY)
Admission: EM | Admit: 2014-09-22 | Discharge: 2014-09-22 | Disposition: A | Discharge status: Home / Self Care | Payer: Medicare Other | Attending: Emergency Medicine | Admitting: Emergency Medicine

## 2014-09-22 ENCOUNTER — Emergency Department (HOSPITAL_COMMUNITY): Payer: Medicare Other

## 2014-09-22 ENCOUNTER — Encounter (HOSPITAL_COMMUNITY): Payer: Self-pay

## 2014-09-22 DIAGNOSIS — R55 Syncope and collapse: Secondary | ICD-10-CM | POA: Diagnosis not present

## 2014-09-22 DIAGNOSIS — S3992XA Unspecified injury of lower back, initial encounter: Secondary | ICD-10-CM | POA: Insufficient documentation

## 2014-09-22 DIAGNOSIS — Z8781 Personal history of (healed) traumatic fracture: Secondary | ICD-10-CM | POA: Insufficient documentation

## 2014-09-22 DIAGNOSIS — I10 Essential (primary) hypertension: Secondary | ICD-10-CM | POA: Diagnosis not present

## 2014-09-22 DIAGNOSIS — M545 Low back pain: Secondary | ICD-10-CM | POA: Diagnosis not present

## 2014-09-22 DIAGNOSIS — S199XXA Unspecified injury of neck, initial encounter: Secondary | ICD-10-CM | POA: Diagnosis not present

## 2014-09-22 DIAGNOSIS — Z79899 Other long term (current) drug therapy: Secondary | ICD-10-CM | POA: Insufficient documentation

## 2014-09-22 DIAGNOSIS — Y9273 Farm field as the place of occurrence of the external cause: Secondary | ICD-10-CM | POA: Insufficient documentation

## 2014-09-22 DIAGNOSIS — H5442 Blindness, left eye, normal vision right eye: Secondary | ICD-10-CM | POA: Insufficient documentation

## 2014-09-22 DIAGNOSIS — S161XXA Strain of muscle, fascia and tendon at neck level, initial encounter: Secondary | ICD-10-CM | POA: Diagnosis not present

## 2014-09-22 DIAGNOSIS — M542 Cervicalgia: Secondary | ICD-10-CM | POA: Diagnosis not present

## 2014-09-22 DIAGNOSIS — W1839XA Other fall on same level, initial encounter: Secondary | ICD-10-CM | POA: Insufficient documentation

## 2014-09-22 DIAGNOSIS — J441 Chronic obstructive pulmonary disease with (acute) exacerbation: Secondary | ICD-10-CM | POA: Diagnosis not present

## 2014-09-22 DIAGNOSIS — T148 Other injury of unspecified body region: Secondary | ICD-10-CM | POA: Diagnosis not present

## 2014-09-22 DIAGNOSIS — S069X9A Unspecified intracranial injury with loss of consciousness of unspecified duration, initial encounter: Secondary | ICD-10-CM | POA: Diagnosis not present

## 2014-09-22 DIAGNOSIS — Y9389 Activity, other specified: Secondary | ICD-10-CM | POA: Diagnosis not present

## 2014-09-22 DIAGNOSIS — Z8673 Personal history of transient ischemic attack (TIA), and cerebral infarction without residual deficits: Secondary | ICD-10-CM | POA: Insufficient documentation

## 2014-09-22 DIAGNOSIS — S0990XA Unspecified injury of head, initial encounter: Secondary | ICD-10-CM | POA: Insufficient documentation

## 2014-09-22 DIAGNOSIS — Y998 Other external cause status: Secondary | ICD-10-CM | POA: Diagnosis not present

## 2014-09-22 HISTORY — DX: Blindness, one eye, unspecified eye: H54.40

## 2014-09-22 HISTORY — DX: Unspecified macular degeneration: H35.30

## 2014-09-22 LAB — COMPREHENSIVE METABOLIC PANEL
ALT: 21 U/L (ref 0–35)
AST: 25 U/L (ref 0–37)
Albumin: 3.7 g/dL (ref 3.5–5.2)
Alkaline Phosphatase: 91 U/L (ref 39–117)
Anion gap: 5 (ref 5–15)
BUN: 8 mg/dL (ref 6–23)
CO2: 27 mmol/L (ref 19–32)
Calcium: 8.9 mg/dL (ref 8.4–10.5)
Chloride: 111 mmol/L (ref 96–112)
Creatinine, Ser: 1.09 mg/dL (ref 0.50–1.10)
GFR calc Af Amer: 57 mL/min — ABNORMAL LOW (ref >90)
GFR calc non Af Amer: 49 mL/min — ABNORMAL LOW (ref >90)
Glucose, Bld: 101 mg/dL — ABNORMAL HIGH (ref 70–99)
Potassium: 3.4 mmol/L — ABNORMAL LOW (ref 3.5–5.1)
Sodium: 143 mmol/L (ref 135–145)
Total Bilirubin: 0.3 mg/dL (ref 0.3–1.2)
Total Protein: 7.1 g/dL (ref 6.0–8.3)

## 2014-09-22 LAB — CBC WITH DIFFERENTIAL/PLATELET
Basophils Absolute: 0.1 10*3/uL (ref 0.0–0.1)
Basophils Relative: 1 % (ref 0–1)
Eosinophils Absolute: 0.2 10*3/uL (ref 0.0–0.7)
Eosinophils Relative: 4 % (ref 0–5)
HCT: 42.6 % (ref 36.0–46.0)
Hemoglobin: 13.6 g/dL (ref 12.0–15.0)
Lymphocytes Relative: 43 % (ref 12–46)
Lymphs Abs: 2.3 10*3/uL (ref 0.7–4.0)
MCH: 29.3 pg (ref 26.0–34.0)
MCHC: 31.9 g/dL (ref 30.0–36.0)
MCV: 91.8 fL (ref 78.0–100.0)
Monocytes Absolute: 0.6 10*3/uL (ref 0.1–1.0)
Monocytes Relative: 11 % (ref 3–12)
Neutro Abs: 2.2 10*3/uL (ref 1.7–7.7)
Neutrophils Relative %: 41 % — ABNORMAL LOW (ref 43–77)
Platelets: 162 10*3/uL (ref 150–400)
RBC: 4.64 MIL/uL (ref 3.87–5.11)
RDW: 13.8 % (ref 11.5–15.5)
WBC: 5.4 10*3/uL (ref 4.0–10.5)

## 2014-09-22 LAB — URINALYSIS, ROUTINE W REFLEX MICROSCOPIC
Bilirubin Urine: NEGATIVE
Glucose, UA: NEGATIVE mg/dL
Hgb urine dipstick: NEGATIVE
Ketones, ur: NEGATIVE mg/dL
Leukocytes, UA: NEGATIVE
Nitrite: NEGATIVE
Protein, ur: NEGATIVE mg/dL
Specific Gravity, Urine: 1.013 (ref 1.005–1.030)
Urobilinogen, UA: 0.2 mg/dL (ref 0.0–1.0)
pH: 7 (ref 5.0–8.0)

## 2014-09-22 MED ORDER — HYDROMORPHONE HCL 1 MG/ML IJ SOLN
1.0000 mg | Freq: Once | INTRAMUSCULAR | Status: AC
  Administered 2014-09-22: 1 mg via INTRAVENOUS
  Filled 2014-09-22: qty 1

## 2014-09-22 MED ORDER — OXYCODONE-ACETAMINOPHEN 5-325 MG PO TABS: 1.0000 | ORAL_TABLET | Freq: Four times a day (QID) | ORAL | Status: DC | PRN

## 2014-09-22 MED ORDER — ONDANSETRON HCL 4 MG/2ML IJ SOLN
4.0000 mg | Freq: Once | INTRAMUSCULAR | Status: AC
  Administered 2014-09-22: 4 mg via INTRAVENOUS
  Filled 2014-09-22: qty 2

## 2014-09-22 MED ORDER — MORPHINE SULFATE 4 MG/ML IJ SOLN
4.0000 mg | Freq: Once | INTRAMUSCULAR | Status: AC
  Administered 2014-09-22: 4 mg via INTRAVENOUS
  Filled 2014-09-22: qty 1

## 2014-09-22 NOTE — Progress Notes (Signed)
CSW attempted to speak with pt at bedside. However, she was not present. Patient's 2 adult children were present. They confirm that the pt presents to [facility] from home due to falling 2 days ago Daughter states " It really wasn't a fall. She passed out and fell. Daughter states that the pt has had 2 strokes in the past. However, she says that she is not exactly sure what caused her to fall during this incident.   Daughter informed CSW that she currently lives at home with pt in [place].  Daughter states that the pt can complete her ADL's independently. Also, she informed CSW that the pt typically ambulates without assistance. However, she says that the pt does have a walker and cane at home.  Daughter and son are determined that pt will be able to properly care for herself at home. That state that the pt is independent. CSW asked family about their interest in a facility. However, they declined. They informed CSW that the pt has a great support sytem.   Daughter states that she lives with pt and son states that he lives only 5 minutes away from the pt.  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 Also, she states that the pt recently had surgery on her R eye.  Daughter states that her primary concern regarding the pt at this time is the pt's neck and back. She states that the pt has a hx of back pain.  Family states that they do not have any questions at this time.  [name] [phone] Sandford Craze 9336) [phone]  Willette Brace [phone] ED CSW 09/22/2014 7:02 PM

## 2014-09-22 NOTE — ED Provider Notes (Signed)
CSN: [id number]     Arrival date & time 09/22/14  1704 History   First MD Initiated Contact with Patient 09/22/14 1740     Chief Complaint  Patient presents with  . Neck Pain  . Fall  . Loss of Consciousness     (Consider location/radiation/quality/duration/timing/severity/associated sxs/prior Treatment) HPI  This a 74 year old female with a history of stroke, hypertension, and recurrent syncope who presents following a fall. Patient reports that she fell 3 days ago following a syncopal event. She reports back, neck, and head pain. Denies any vomiting. Current pain is 8 out of 10. Reports recurrent history of syncope for which she has been seen as an outpatient by neurology. Initially diagnosed with peripheral vertigo and then thought to be a result of her prior strokes. Patient endorses urinary frequency but no urgency or dysuria. Otherwise patient denies any chest pain, shortness of breath, or abdominal pain.  Patient denies any weakness, numbness, or tingling. Patient was placed in c-collar by EMS.  Past Medical History  Diagnosis Date  . COPD (chronic obstructive pulmonary disease)   . Hypertension   . Stroke   . Macular degeneration   . Blind left eye    Past Surgical History  Procedure Laterality Date  . Knee surgery    . Cholecystectomy    . Abdominal hysterectomy    . Ostomy    . Eye surgery      cataracts   No family history on file. History  Substance Use Topics  . Smoking status: Former Research scientist (life sciences)  . Smokeless tobacco: Not on file  . Alcohol Use: No   OB History    No data available     Review of Systems  Constitutional: Negative for fever.  Respiratory: Negative for cough, chest tightness and shortness of breath.   Cardiovascular: Negative for chest pain.  Gastrointestinal: Negative for nausea, vomiting and abdominal pain.  Genitourinary: Negative for dysuria.  Musculoskeletal: Positive for back pain and neck pain.  Skin: Negative for wound.  Neurological:  Positive for syncope, weakness, numbness and headaches.  Psychiatric/Behavioral: Negative for confusion.  All other systems reviewed and are negative.     Allergies  Ampicillin; Toradol; and Tramadol  Home Medications   Prior to Admission medications   Medication Sig Start Date End Date Taking? Authorizing Provider  ADVAIR DISKUS 250-50 MCG/DOSE AEPB Inhale 1 capsule into the lungs daily. 09/22/14  Yes Historical Provider, MD  albuterol (ACCUNEB) 0.63 MG/3ML nebulizer solution Take 1 ampule by nebulization every 6 (six) hours as needed for wheezing.   Yes Historical Provider, MD  ALPRAZolam Duanne Moron) 1 MG tablet Take 0.5-1 mg by mouth 2 (two) times daily as needed. Take 1 mg at bedtime as needed for sleep. May take 0.5 mg daily as needed for anxiety. 09/08/14  Yes Historical Provider, MD  amLODipine (NORVASC) 5 MG tablet Take 5 mg by mouth daily. 09/22/14  Yes Historical Provider, MD  citalopram (CELEXA) 40 MG tablet Take 20 mg by mouth every morning. 08/26/14  Yes Historical Provider, MD  DOCUSATE SODIUM PO Take 1 capsule by mouth daily.   Yes Historical Provider, MD  gabapentin (NEURONTIN) 300 MG capsule Take 300 mg by mouth 3 (three) times daily. 09/20/14  Yes Historical Provider, MD  HYDROcodone-acetaminophen (NORCO/VICODIN) 5-325 MG per tablet Take 1-2 tablets by mouth 3 (three) times daily as needed for moderate pain or severe pain.  08/26/14  Yes Historical Provider, MD  lovastatin (MEVACOR) 10 MG tablet Take 10 mg by mouth daily.  09/22/14  Yes Historical Provider, MD  meclizine (ANTIVERT) 25 MG tablet Take 25 mg by mouth 3 (three) times daily as needed for dizziness.  08/26/14  Yes Historical Provider, MD  NEXIUM 40 MG capsule Take 40 mg by mouth 2 (two) times daily before a meal.  08/26/14  Yes Historical Provider, MD  nystatin (MYCOSTATIN) 100000 UNIT/ML suspension Take 5 mLs by mouth as directed. Swish and spit. 08/02/14  Yes Historical Provider, MD  polyethylene glycol powder (GLYCOLAX/MIRALAX)  powder Take 17 g by mouth daily. 07/12/14  Yes Historical Provider, MD  PROMETHEGAN 12.5 MG suppository Place 1 suppository rectally daily as needed for nausea or vomiting.  07/16/14  Yes Historical Provider, MD  sennosides-docusate sodium (SENOKOT-S) 8.6-50 MG tablet Take 1 tablet by mouth daily.   Yes Historical Provider, MD  tiZANidine (ZANAFLEX) 4 MG tablet Take 2 mg by mouth at bedtime. 08/31/14  Yes Historical Provider, MD  traZODone (DESYREL) 50 MG tablet Take 50 mg by mouth at bedtime. 08/26/14  Yes Historical Provider, MD  VENTOLIN HFA 108 (90 BASE) MCG/ACT inhaler Take 2 puffs by mouth every 6 (six) hours as needed. 09/22/14  Yes Historical Provider, MD  vitamin B-12 (CYANOCOBALAMIN) 1000 MCG tablet Take 1,000 mcg by mouth daily.   Yes Historical Provider, MD  oxyCODONE-acetaminophen (PERCOCET/ROXICET) 5-325 MG per tablet Take 1 tablet by mouth every 6 (six) hours as needed for severe pain. 09/22/14   Merryl Hacker, MD   BP 172/74 mmHg  Pulse 72  Temp(Src) 98.4 F (36.9 C) (Oral)  Resp 19  SpO2 91% Physical Exam  Constitutional: She is oriented to person, place, and time. She appears well-developed and well-nourished.  HENT:  Head: Normocephalic and atraumatic.  Eyes: EOM are normal. Pupils are equal, round, and reactive to light.  Neck:  C-collar in place, normal range of motion, midline and paraspinous muscle tenderness palpation  Cardiovascular: Normal rate, regular rhythm and normal heart sounds.   No murmur heard. Pulmonary/Chest: Effort normal and breath sounds normal. No respiratory distress. She has no wheezes.  Abdominal: Soft. Bowel sounds are normal. There is no tenderness. There is no rebound and no guarding.  Neurological: She is alert and oriented to person, place, and time.  5/ 5 strength in all 4 extremities, no dysmetria to finger-nose-finger, cranial nerves II through XII intact.  Skin: Skin is warm and dry.  Psychiatric: She has a normal mood and affect.   Nursing note and vitals reviewed.   ED Course  Procedures (including critical care time) Labs Review Labs Reviewed  CBC WITH DIFFERENTIAL/PLATELET - Abnormal; Notable for the following:    Neutrophils Relative % 41 (*)    All other components within normal limits  COMPREHENSIVE METABOLIC PANEL - Abnormal; Notable for the following:    Potassium 3.4 (*)    Glucose, Bld 101 (*)    GFR calc non Af Amer 49 (*)    GFR calc Af Amer 57 (*)    All other components within normal limits  URINALYSIS, ROUTINE W REFLEX MICROSCOPIC    Imaging Review Dg Lumbar Spine Complete  09/22/2014   CLINICAL DATA:  Fall 2 days ago following syncopal episode. Neck pain and lower back pain. Pain radiating down left leg.  EXAM: LUMBAR SPINE - COMPLETE 4+ VIEW  COMPARISON:  Chest radiograph 229 1,016  FINDINGS: There is mild endplate spurring. There is no evidence acute loss of vertebral body height or disc height. No subluxation.  IMPRESSION: No evidence acute injury to the  lumbar spine.   Electronically Signed   By: Suzy Bouchard M.D.   On: 09/22/2014 18:45   Ct Head Wo Contrast  09/22/2014   CLINICAL DATA:  Fall and loss consciousness. Fall 2 days prior after syncopal episode.  EXAM: CT HEAD WITHOUT CONTRAST  CT CERVICAL SPINE WITHOUT CONTRAST  TECHNIQUE: Multidetector CT imaging of the head and cervical spine was performed following the standard protocol without intravenous contrast. Multiplanar CT image reconstructions of the cervical spine were also generated.  COMPARISON:  None.  FINDINGS: CT HEAD FINDINGS  No intracranial hemorrhage. No parenchymal contusion. No midline shift or mass effect. Basilar cisterns are patent. No skull base fracture. No fluid in the paranasal sinuses or mastoid air cells. Orbits are normal. There is generalized cortical atrophy. There is moderate periventricular subcortical white matter hypodensities  CT CERVICAL SPINE FINDINGS  No prevertebral soft tissue swelling. Normal alignment  of cervical vertebral bodies. No loss of vertebral body height. Normal facet articulation. Normal craniocervical junction.  No evidence epidural or paraspinal hematoma.  There is mild degenerate endplate change. There is sclerosis at the tip of the dens and calcification within the ligaments posterior the dens.  IMPRESSION: 1. No intracranial trauma. 2. Mild atrophy and microvascular white matter disease. 3. No cervical spine fracture. 4. Degenerate change in the cervical spine most prominent at the odontoid process.   Electronically Signed   By: Suzy Bouchard M.D.   On: 09/22/2014 18:51   Ct Cervical Spine Wo Contrast  09/22/2014   CLINICAL DATA:  Fall and loss consciousness. Fall 2 days prior after syncopal episode.  EXAM: CT HEAD WITHOUT CONTRAST  CT CERVICAL SPINE WITHOUT CONTRAST  TECHNIQUE: Multidetector CT imaging of the head and cervical spine was performed following the standard protocol without intravenous contrast. Multiplanar CT image reconstructions of the cervical spine were also generated.  COMPARISON:  None.  FINDINGS: CT HEAD FINDINGS  No intracranial hemorrhage. No parenchymal contusion. No midline shift or mass effect. Basilar cisterns are patent. No skull base fracture. No fluid in the paranasal sinuses or mastoid air cells. Orbits are normal. There is generalized cortical atrophy. There is moderate periventricular subcortical white matter hypodensities  CT CERVICAL SPINE FINDINGS  No prevertebral soft tissue swelling. Normal alignment of cervical vertebral bodies. No loss of vertebral body height. Normal facet articulation. Normal craniocervical junction.  No evidence epidural or paraspinal hematoma.  There is mild degenerate endplate change. There is sclerosis at the tip of the dens and calcification within the ligaments posterior the dens.  IMPRESSION: 1. No intracranial trauma. 2. Mild atrophy and microvascular white matter disease. 3. No cervical spine fracture. 4. Degenerate change in  the cervical spine most prominent at the odontoid process.   Electronically Signed   By: Suzy Bouchard M.D.   On: 09/22/2014 18:51     EKG Interpretation   Date/Time:  Thursday September 22 2014 17:19:44 EDT Ventricular Rate:  67 PR Interval:  170 QRS Duration: 88 QT Interval:  430 QTC Calculation: 454 R Axis:   -3 Text Interpretation:  Sinus rhythm Left ventricular hypertrophy Inferior  infarct, old no prior for comparison Confirmed by HORTON  MD, COURTNEY  [phone]) on 09/22/2014 7:17:27 PM      MDM   Final diagnoses:  Cervical strain, acute, initial encounter  Syncope, unspecified syncope type    Patient presents with syncope and fall 3 days ago resulting in back, head, and neck pain. Nontoxic on exam. Reports recurrent syncope for which she's had  extensive outpatient workup. Per nursing, patient had recurrent syncopal episode in triage. Nontoxic. EKG without evidence of arrhythmia. No evidence of orthostasis. CT, plain films, and lab work is all reassuring including urinalysis. C-collar cleared at the bedside. Patient ambulatory. Will discharge home with a short course of Percocet. Patient instructed not to take Percocet with Norco at home.  Patient to follow-up with her primary care doctor and neurologist as an outpatient.  After history, exam, and medical workup I feel the patient has been appropriately medically screened and is safe for discharge home. Pertinent diagnoses were discussed with the patient. Patient was given return precautions.     Merryl Hacker, MD 09/22/14 2037

## 2014-09-22 NOTE — ED Notes (Signed)
Pt went to bathroom about 8-9 times within 3 hours this am, which is abnormal for pt.  Pt states, "i went a whole bunch".

## 2014-09-22 NOTE — ED Notes (Signed)
Pt presents via EMS with c/o fall and LOC. Pt fell approx 2 days ago after a syncopal episode. Pt c/o neck pain and back pain. While in triage, pt has a syncopal episode and had to be sternal rubbed to arouse her. Pt is alert at this time and vomiting at present.

## 2014-09-22 NOTE — ED Notes (Signed)
Pt ambulated with minimal assist to restroom 3 on the hallway. Denies any other pain different from present pain level.

## 2014-09-22 NOTE — Discharge Instructions (Signed)
You were seen today after a fall 3 days ago. He reports recurrent syncope for which she has been worked up extensively as an outpatient. No new findings today. All your x-rays and CT scans are reassuring. He will be discharged home with a short course of Percocet for pain. You need to discontinue Norco use at home while taking Percocet.  Cervical Sprain A cervical sprain is when the tissues (ligaments) that hold the neck bones in place stretch or tear. HOME CARE   Put ice on the injured area.  Put ice in a plastic bag.  Place a towel between your skin and the bag.  Leave the ice on for 15-20 minutes, 3-4 times a day.  You may have been given a collar to wear. This collar keeps your neck from moving while you heal.  Do not take the collar off unless told by your doctor.  If you have long hair, keep it outside of the collar.  Ask your doctor before changing the position of your collar. You may need to change its position over time to make it more comfortable.  If you are allowed to take off the collar for cleaning or bathing, follow your doctor's instructions on how to do it safely.  Keep your collar clean by wiping it with mild soap and water. Dry it completely. If the collar has removable pads, remove them every 1-2 days to hand wash them with soap and water. Allow them to air dry. They should be dry before you wear them in the collar.  Do not drive while wearing the collar.  Only take medicine as told by your doctor.  Keep all doctor visits as told.  Keep all physical therapy visits as told.  Adjust your work station so that you have good posture while you work.  Avoid positions and activities that make your problems worse.  Warm up and stretch before being active. GET HELP IF:  Your pain is not controlled with medicine.  You cannot take less pain medicine over time as planned.  Your activity level does not improve as expected. GET HELP RIGHT AWAY IF:   You are  bleeding.  Your stomach is upset.  You have an allergic reaction to your medicine.  You develop new problems that you cannot explain.  You lose feeling (become numb) or you cannot move any part of your body (paralysis).  You have tingling or weakness in any part of your body.  Your symptoms get worse. Symptoms include:  Pain, soreness, stiffness, puffiness (swelling), or a burning feeling in your neck.  Pain when your neck is touched.  Shoulder or upper back pain.  Limited ability to move your neck.  Headache.  Dizziness.  Your hands or arms feel week, lose feeling, or tingle.  Muscle spasms.  Difficulty swallowing or chewing. MAKE SURE YOU:   Understand these instructions.  Will watch your condition.  Will get help right away if you are not doing well or get worse. Document Released: 10/30/2007 Document Revised: 01/13/2013 Document Reviewed: 11/18/2012 [facility] Patient Information 2015 [place], [place]. This information is not intended to replace advice given to you by your health care provider. Make sure you discuss any questions you have with your health care provider. Syncope Syncope is a medical term for fainting or passing out. This means you lose consciousness and drop to the ground. People are generally unconscious for less than 5 minutes. You may have some muscle twitches for up to 15 seconds before waking  up and returning to normal. Syncope occurs more often in older adults, but it can happen to anyone. While most causes of syncope are not dangerous, syncope can be a sign of a serious medical problem. It is important to seek medical care.  CAUSES  Syncope is caused by a sudden drop in blood flow to the brain. The specific cause is often not determined. Factors that can bring on syncope include:  Taking medicines that lower blood pressure.  Sudden changes in posture, such as standing up quickly.  Taking more medicine than prescribed.  Standing in one place  for too long.  Seizure disorders.  Dehydration and excessive exposure to heat.  Low blood sugar (hypoglycemia).  Straining to have a bowel movement.  Heart disease, irregular heartbeat, or other circulatory problems.  Fear, emotional distress, seeing blood, or severe pain. SYMPTOMS  Right before fainting, you may:  Feel dizzy or light-headed.  Feel nauseous.  See all white or all black in your field of vision.  Have cold, clammy skin. DIAGNOSIS  Your health care provider will ask about your symptoms, perform a physical exam, and perform an electrocardiogram (ECG) to record the electrical activity of your heart. Your health care provider may also perform other heart or blood tests to determine the cause of your syncope which may include:  Transthoracic echocardiogram (TTE). During echocardiography, sound waves are used to evaluate how blood flows through your heart.  Transesophageal echocardiogram (TEE).  Cardiac monitoring. This allows your health care provider to monitor your heart rate and rhythm in real time.  Holter monitor. This is a portable device that records your heartbeat and can help diagnose heart arrhythmias. It allows your health care provider to track your heart activity for several days, if needed.  Stress tests by exercise or by giving medicine that makes the heart beat faster. TREATMENT  In most cases, no treatment is needed. Depending on the cause of your syncope, your health care provider may recommend changing or stopping some of your medicines. HOME CARE INSTRUCTIONS  Have someone stay with you until you feel stable.  Do not drive, use machinery, or play sports until your health care provider says it is okay.  Keep all follow-up appointments as directed by your health care provider.  Lie down right away if you start feeling like you might faint. Breathe deeply and steadily. Wait until all the symptoms have passed.  Drink enough fluids to keep your  urine clear or pale yellow.  If you are taking blood pressure or heart medicine, get up slowly and take several minutes to sit and then stand. This can reduce dizziness. SEEK IMMEDIATE MEDICAL CARE IF:   You have a severe headache.  You have unusual pain in the chest, abdomen, or back.  You are bleeding from your mouth or rectum, or you have black or tarry stool.  You have an irregular or very fast heartbeat.  You have pain with breathing.  You have repeated fainting or seizure-like jerking during an episode.  You faint when sitting or lying down.  You have confusion.  You have trouble walking.  You have severe weakness.  You have vision problems. If you fainted, call your local emergency services (911 in [place] Do not drive yourself to the hospital.  MAKE SURE YOU:  Understand these instructions.  Will watch your condition.  Will get help right away if you are not doing well or get worse. Document Released: 05/13/2005 Document Revised: 05/18/2013 Document Reviewed: 07/12/2011 [employer]  Patient Information 2015 [place]. This information is not intended to replace advice given to you by your health care provider. Make sure you discuss any questions you have with your health care provider.

## 2014-09-22 NOTE — ED Notes (Signed)
Per EMS, pt from home. Pt fell in a field x 3 days ago.  C/O neck and back pain x 3 days.  Pain 8/10.  C-Collar in place.  Vitals: 130/68, hr 64, 95% on 2l per , 90% ra, resp 18.  Pt does not wear oxygen.

## 2014-09-22 NOTE — ED Notes (Signed)
Bed: WA21 Expected date:  Expected time:  Means of arrival:  Comments: Triage 5

## 2014-09-28 ENCOUNTER — Other Ambulatory Visit: Payer: Self-pay | Admitting: Internal Medicine

## 2014-09-28 ENCOUNTER — Ambulatory Visit
Admission: RE | Admit: 2014-09-28 | Discharge: 2014-09-28 | Disposition: A | Discharge status: Home / Self Care | Payer: Medicare Other | Source: Ambulatory Visit | Attending: Internal Medicine | Admitting: Internal Medicine

## 2014-09-28 DIAGNOSIS — M549 Dorsalgia, unspecified: Secondary | ICD-10-CM

## 2014-09-28 DIAGNOSIS — S299XXA Unspecified injury of thorax, initial encounter: Secondary | ICD-10-CM | POA: Diagnosis not present

## 2014-09-28 DIAGNOSIS — M542 Cervicalgia: Secondary | ICD-10-CM

## 2014-09-28 DIAGNOSIS — S199XXA Unspecified injury of neck, initial encounter: Secondary | ICD-10-CM | POA: Diagnosis not present

## 2014-09-28 DIAGNOSIS — R0781 Pleurodynia: Secondary | ICD-10-CM | POA: Diagnosis not present

## 2014-09-29 ENCOUNTER — Ambulatory Visit: Payer: Medicare Other | Admitting: Cardiovascular Disease

## 2014-10-05 ENCOUNTER — Emergency Department (HOSPITAL_COMMUNITY): Payer: Medicare Other

## 2014-10-05 ENCOUNTER — Other Ambulatory Visit: Payer: Medicare Other

## 2014-10-05 ENCOUNTER — Observation Stay (HOSPITAL_BASED_OUTPATIENT_CLINIC_OR_DEPARTMENT_OTHER)
Admission: EM | Admit: 2014-10-05 | Discharge: 2014-10-06 | Disposition: A | Discharge status: Home / Self Care | Payer: Medicare Other | Source: Home / Self Care | Attending: Emergency Medicine | Admitting: Emergency Medicine

## 2014-10-05 ENCOUNTER — Encounter (HOSPITAL_COMMUNITY): Payer: Self-pay | Admitting: Emergency Medicine

## 2014-10-05 DIAGNOSIS — S12690A Other displaced fracture of seventh cervical vertebra, initial encounter for closed fracture: Secondary | ICD-10-CM | POA: Diagnosis not present

## 2014-10-05 DIAGNOSIS — J449 Chronic obstructive pulmonary disease, unspecified: Secondary | ICD-10-CM | POA: Diagnosis present

## 2014-10-05 DIAGNOSIS — S22029A Unspecified fracture of second thoracic vertebra, initial encounter for closed fracture: Secondary | ICD-10-CM | POA: Diagnosis present

## 2014-10-05 DIAGNOSIS — S199XXA Unspecified injury of neck, initial encounter: Secondary | ICD-10-CM | POA: Diagnosis not present

## 2014-10-05 DIAGNOSIS — W19XXXA Unspecified fall, initial encounter: Secondary | ICD-10-CM | POA: Diagnosis present

## 2014-10-05 DIAGNOSIS — K219 Gastro-esophageal reflux disease without esophagitis: Secondary | ICD-10-CM | POA: Diagnosis present

## 2014-10-05 DIAGNOSIS — R509 Fever, unspecified: Secondary | ICD-10-CM

## 2014-10-05 DIAGNOSIS — S12600A Unspecified displaced fracture of seventh cervical vertebra, initial encounter for closed fracture: Secondary | ICD-10-CM | POA: Insufficient documentation

## 2014-10-05 DIAGNOSIS — J9811 Atelectasis: Secondary | ICD-10-CM | POA: Insufficient documentation

## 2014-10-05 DIAGNOSIS — D709 Neutropenia, unspecified: Secondary | ICD-10-CM | POA: Diagnosis present

## 2014-10-05 DIAGNOSIS — I10 Essential (primary) hypertension: Secondary | ICD-10-CM | POA: Diagnosis present

## 2014-10-05 DIAGNOSIS — S299XXA Unspecified injury of thorax, initial encounter: Secondary | ICD-10-CM | POA: Diagnosis not present

## 2014-10-05 DIAGNOSIS — E871 Hypo-osmolality and hyponatremia: Secondary | ICD-10-CM | POA: Diagnosis not present

## 2014-10-05 DIAGNOSIS — Y9289 Other specified places as the place of occurrence of the external cause: Secondary | ICD-10-CM

## 2014-10-05 DIAGNOSIS — A419 Sepsis, unspecified organism: Principal | ICD-10-CM | POA: Diagnosis present

## 2014-10-05 DIAGNOSIS — E876 Hypokalemia: Secondary | ICD-10-CM | POA: Diagnosis not present

## 2014-10-05 DIAGNOSIS — Z87891 Personal history of nicotine dependence: Secondary | ICD-10-CM

## 2014-10-05 DIAGNOSIS — Z888 Allergy status to other drugs, medicaments and biological substances status: Secondary | ICD-10-CM

## 2014-10-05 DIAGNOSIS — R109 Unspecified abdominal pain: Secondary | ICD-10-CM | POA: Diagnosis not present

## 2014-10-05 DIAGNOSIS — M4854XA Collapsed vertebra, not elsewhere classified, thoracic region, initial encounter for fracture: Secondary | ICD-10-CM | POA: Diagnosis present

## 2014-10-05 DIAGNOSIS — E785 Hyperlipidemia, unspecified: Secondary | ICD-10-CM | POA: Diagnosis present

## 2014-10-05 DIAGNOSIS — R51 Headache: Secondary | ICD-10-CM | POA: Diagnosis not present

## 2014-10-05 DIAGNOSIS — S0990XA Unspecified injury of head, initial encounter: Secondary | ICD-10-CM | POA: Diagnosis not present

## 2014-10-05 DIAGNOSIS — Z880 Allergy status to penicillin: Secondary | ICD-10-CM

## 2014-10-05 DIAGNOSIS — M5136 Other intervertebral disc degeneration, lumbar region: Secondary | ICD-10-CM | POA: Diagnosis present

## 2014-10-05 DIAGNOSIS — S22039A Unspecified fracture of third thoracic vertebra, initial encounter for closed fracture: Secondary | ICD-10-CM | POA: Diagnosis present

## 2014-10-05 DIAGNOSIS — R079 Chest pain, unspecified: Secondary | ICD-10-CM | POA: Diagnosis not present

## 2014-10-05 DIAGNOSIS — Z9049 Acquired absence of other specified parts of digestive tract: Secondary | ICD-10-CM | POA: Diagnosis present

## 2014-10-05 DIAGNOSIS — M1990 Unspecified osteoarthritis, unspecified site: Secondary | ICD-10-CM | POA: Diagnosis present

## 2014-10-05 DIAGNOSIS — Z8673 Personal history of transient ischemic attack (TIA), and cerebral infarction without residual deficits: Secondary | ICD-10-CM

## 2014-10-05 DIAGNOSIS — R0902 Hypoxemia: Secondary | ICD-10-CM | POA: Diagnosis not present

## 2014-10-05 DIAGNOSIS — R269 Unspecified abnormalities of gait and mobility: Secondary | ICD-10-CM

## 2014-10-05 DIAGNOSIS — Z90710 Acquired absence of both cervix and uterus: Secondary | ICD-10-CM

## 2014-10-05 DIAGNOSIS — D696 Thrombocytopenia, unspecified: Secondary | ICD-10-CM | POA: Diagnosis not present

## 2014-10-05 DIAGNOSIS — S22000A Wedge compression fracture of unspecified thoracic vertebra, initial encounter for closed fracture: Secondary | ICD-10-CM

## 2014-10-05 DIAGNOSIS — E7800 Pure hypercholesterolemia, unspecified: Secondary | ICD-10-CM | POA: Diagnosis present

## 2014-10-05 DIAGNOSIS — E860 Dehydration: Secondary | ICD-10-CM | POA: Diagnosis present

## 2014-10-05 DIAGNOSIS — M810 Age-related osteoporosis without current pathological fracture: Secondary | ICD-10-CM | POA: Diagnosis present

## 2014-10-05 DIAGNOSIS — S22008A Other fracture of unspecified thoracic vertebra, initial encounter for closed fracture: Secondary | ICD-10-CM | POA: Diagnosis not present

## 2014-10-05 DIAGNOSIS — J45909 Unspecified asthma, uncomplicated: Secondary | ICD-10-CM | POA: Diagnosis present

## 2014-10-05 HISTORY — DX: Fever, unspecified: R50.9

## 2014-10-05 LAB — CBC WITH DIFFERENTIAL/PLATELET
Basophils Absolute: 0 10*3/uL (ref 0.0–0.1)
Basophils Relative: 1 % (ref 0–1)
Eosinophils Absolute: 0 10*3/uL (ref 0.0–0.7)
Eosinophils Relative: 1 % (ref 0–5)
HCT: 44.8 % (ref 36.0–46.0)
Hemoglobin: 14.5 g/dL (ref 12.0–15.0)
Lymphocytes Relative: 16 % (ref 12–46)
Lymphs Abs: 0.7 10*3/uL (ref 0.7–4.0)
MCH: 29.2 pg (ref 26.0–34.0)
MCHC: 32.4 g/dL (ref 30.0–36.0)
MCV: 90.3 fL (ref 78.0–100.0)
Monocytes Absolute: 0.4 10*3/uL (ref 0.1–1.0)
Monocytes Relative: 10 % (ref 3–12)
Neutro Abs: 3.1 10*3/uL (ref 1.7–7.7)
Neutrophils Relative %: 72 % (ref 43–77)
Platelets: 143 10*3/uL — ABNORMAL LOW (ref 150–400)
RBC: 4.96 MIL/uL (ref 3.87–5.11)
RDW: 14.1 % (ref 11.5–15.5)
WBC: 4.3 10*3/uL (ref 4.0–10.5)

## 2014-10-05 LAB — URINALYSIS, ROUTINE W REFLEX MICROSCOPIC
Bilirubin Urine: NEGATIVE
Glucose, UA: NEGATIVE mg/dL
Ketones, ur: NEGATIVE mg/dL
Leukocytes, UA: NEGATIVE
Nitrite: NEGATIVE
Protein, ur: NEGATIVE mg/dL
Specific Gravity, Urine: 1.007 (ref 1.005–1.030)
Urobilinogen, UA: 0.2 mg/dL (ref 0.0–1.0)
pH: 7.5 (ref 5.0–8.0)

## 2014-10-05 LAB — URINE MICROSCOPIC-ADD ON: RBC / HPF: NONE SEEN RBC/hpf (ref <3)

## 2014-10-05 LAB — I-STAT ARTERIAL BLOOD GAS, ED
Acid-base deficit: 2 mmol/L (ref 0.0–2.0)
Bicarbonate: 24.5 mEq/L — ABNORMAL HIGH (ref 20.0–24.0)
O2 Saturation: 97 %
Patient temperature: 98.6
TCO2: 26 mmol/L (ref 0–100)
pCO2 arterial: 47.4 mmHg — ABNORMAL HIGH (ref 35.0–45.0)
pH, Arterial: 7.32 — ABNORMAL LOW (ref 7.350–7.450)
pO2, Arterial: 101 mmHg — ABNORMAL HIGH (ref 80.0–100.0)

## 2014-10-05 LAB — COMPREHENSIVE METABOLIC PANEL
ALT: 18 U/L (ref 14–54)
AST: 31 U/L (ref 15–41)
Albumin: 3.5 g/dL (ref 3.5–5.0)
Alkaline Phosphatase: 97 U/L (ref 38–126)
Anion gap: 11 (ref 5–15)
BUN: 8 mg/dL (ref 6–20)
CO2: 25 mmol/L (ref 22–32)
Calcium: 9.1 mg/dL (ref 8.9–10.3)
Chloride: 103 mmol/L (ref 101–111)
Creatinine, Ser: 1.24 mg/dL — ABNORMAL HIGH (ref 0.44–1.00)
GFR calc Af Amer: 49 mL/min — ABNORMAL LOW (ref >60)
GFR calc non Af Amer: 42 mL/min — ABNORMAL LOW (ref >60)
Glucose, Bld: 99 mg/dL (ref 70–99)
Potassium: 3.6 mmol/L (ref 3.5–5.1)
Sodium: 139 mmol/L (ref 135–145)
Total Bilirubin: 0.8 mg/dL (ref 0.3–1.2)
Total Protein: 7.4 g/dL (ref 6.5–8.1)

## 2014-10-05 LAB — I-STAT CG4 LACTIC ACID, ED: Lactic Acid, Venous: 0.9 mmol/L (ref 0.5–2.0)

## 2014-10-05 LAB — LIPASE, BLOOD: Lipase: 18 U/L — ABNORMAL LOW (ref 22–51)

## 2014-10-05 MED ORDER — ONDANSETRON HCL 4 MG/2ML IJ SOLN
4.0000 mg | Freq: Once | INTRAMUSCULAR | Status: AC
  Administered 2014-10-05: 4 mg via INTRAVENOUS
  Filled 2014-10-05: qty 2

## 2014-10-05 MED ORDER — TRAZODONE 25 MG HALF TABLET
25.0000 mg | ORAL_TABLET | Freq: Every day | ORAL | Status: DC
  Administered 2014-10-05: 25 mg via ORAL
  Filled 2014-10-05 (×2): qty 1

## 2014-10-05 MED ORDER — SODIUM CHLORIDE 0.9 % IV BOLUS (SEPSIS)
500.0000 mL | Freq: Once | INTRAVENOUS | Status: AC
  Administered 2014-10-05: 500 mL via INTRAVENOUS

## 2014-10-05 MED ORDER — HYDROMORPHONE HCL 1 MG/ML IJ SOLN
0.5000 mg | Freq: Once | INTRAMUSCULAR | Status: AC
  Administered 2014-10-05: 0.5 mg via INTRAVENOUS
  Filled 2014-10-05: qty 1

## 2014-10-05 MED ORDER — ACETAMINOPHEN 650 MG RE SUPP: 650.0000 mg | Freq: Four times a day (QID) | RECTAL | Status: DC | PRN

## 2014-10-05 MED ORDER — DICYCLOMINE HCL 20 MG PO TABS
20.0000 mg | ORAL_TABLET | Freq: Three times a day (TID) | ORAL | Status: DC | PRN
Start: 2014-10-05 — End: 2014-10-06
  Filled 2014-10-05: qty 1

## 2014-10-05 MED ORDER — HYDROCODONE-ACETAMINOPHEN 5-325 MG PO TABS
1.0000 | ORAL_TABLET | Freq: Four times a day (QID) | ORAL | Status: DC | PRN
  Administered 2014-10-05 – 2014-10-06 (×2): 1 via ORAL
  Filled 2014-10-05 (×2): qty 1

## 2014-10-05 MED ORDER — HYDROMORPHONE HCL 1 MG/ML IJ SOLN
1.0000 mg | Freq: Once | INTRAMUSCULAR | Status: AC
  Administered 2014-10-05: 1 mg via INTRAVENOUS
  Filled 2014-10-05: qty 1

## 2014-10-05 MED ORDER — FLEET ENEMA 7-19 GM/118ML RE ENEM
1.0000 | ENEMA | Freq: Once | RECTAL | Status: AC | PRN
  Filled 2014-10-05: qty 1

## 2014-10-05 MED ORDER — SENNA 8.6 MG PO TABS
1.0000 | ORAL_TABLET | Freq: Every day | ORAL | Status: DC
  Administered 2014-10-06: 8.6 mg via ORAL
  Filled 2014-10-05: qty 1

## 2014-10-05 MED ORDER — RA REWETTING DROPS SOLN: 1.0000 "application " | Freq: Every day | Status: DC

## 2014-10-05 MED ORDER — POLYETHYLENE GLYCOL 3350 17 G PO PACK
17.0000 g | PACK | Freq: Every day | ORAL | Status: DC | PRN
  Filled 2014-10-05: qty 1

## 2014-10-05 MED ORDER — MECLIZINE HCL 12.5 MG PO TABS
12.5000 mg | ORAL_TABLET | Freq: Three times a day (TID) | ORAL | Status: DC | PRN
  Administered 2014-10-05: 12.5 mg via ORAL
  Filled 2014-10-05 (×2): qty 1

## 2014-10-05 MED ORDER — PRAVASTATIN SODIUM 10 MG PO TABS: 10.0000 mg | ORAL_TABLET | Freq: Every day | ORAL | Status: DC

## 2014-10-05 MED ORDER — POLYVINYL ALCOHOL 1.4 % OP SOLN
1.0000 [drp] | OPHTHALMIC | Status: DC | PRN
  Filled 2014-10-05: qty 15

## 2014-10-05 MED ORDER — BISACODYL 10 MG RE SUPP: 10.0000 mg | Freq: Every day | RECTAL | Status: DC | PRN

## 2014-10-05 MED ORDER — AMLODIPINE BESYLATE 2.5 MG PO TABS
2.5000 mg | ORAL_TABLET | Freq: Two times a day (BID) | ORAL | Status: DC
  Administered 2014-10-05 – 2014-10-06 (×2): 2.5 mg via ORAL
  Filled 2014-10-05 (×3): qty 1

## 2014-10-05 MED ORDER — SENNOSIDES 8.6 MG PO TABS: 1.0000 | ORAL_TABLET | Freq: Every day | ORAL | Status: DC

## 2014-10-05 MED ORDER — MELOXICAM 7.5 MG PO TABS
7.5000 mg | ORAL_TABLET | Freq: Every day | ORAL | Status: DC
  Administered 2014-10-06: 7.5 mg via ORAL
  Filled 2014-10-05: qty 1

## 2014-10-05 MED ORDER — ONDANSETRON HCL 4 MG PO TABS: 4.0000 mg | ORAL_TABLET | Freq: Four times a day (QID) | ORAL | Status: DC | PRN

## 2014-10-05 MED ORDER — HEPARIN SODIUM (PORCINE) 5000 UNIT/ML IJ SOLN
5000.0000 [IU] | Freq: Three times a day (TID) | INTRAMUSCULAR | Status: DC
  Administered 2014-10-05: 5000 [IU] via SUBCUTANEOUS
  Filled 2014-10-05 (×3): qty 1

## 2014-10-05 MED ORDER — ALPRAZOLAM 0.25 MG PO TABS
0.2500 mg | ORAL_TABLET | Freq: Every evening | ORAL | Status: DC | PRN
  Administered 2014-10-05: 0.25 mg via ORAL
  Filled 2014-10-05: qty 1

## 2014-10-05 MED ORDER — PRAVASTATIN SODIUM 10 MG PO TABS
10.0000 mg | ORAL_TABLET | Freq: Every day | ORAL | Status: DC
  Filled 2014-10-05 (×2): qty 1

## 2014-10-05 MED ORDER — PANTOPRAZOLE SODIUM 40 MG PO TBEC: 80.0000 mg | DELAYED_RELEASE_TABLET | Freq: Every day | ORAL | Status: DC

## 2014-10-05 MED ORDER — POLYETHYLENE GLYCOL 3350 17 G PO PACK
17.0000 g | PACK | Freq: Every day | ORAL | Status: DC
  Administered 2014-10-06: 17 g via ORAL
  Filled 2014-10-05: qty 1

## 2014-10-05 MED ORDER — CITALOPRAM HYDROBROMIDE 20 MG PO TABS
20.0000 mg | ORAL_TABLET | Freq: Every day | ORAL | Status: DC
  Administered 2014-10-06: 20 mg via ORAL
  Filled 2014-10-05: qty 1

## 2014-10-05 MED ORDER — POLYETHYLENE GLYCOL 3350 17 GM/SCOOP PO POWD
17.0000 g | Freq: Every day | ORAL | Status: DC
  Filled 2014-10-05: qty 255

## 2014-10-05 MED ORDER — ACETAMINOPHEN 325 MG PO TABS
650.0000 mg | ORAL_TABLET | Freq: Four times a day (QID) | ORAL | Status: DC | PRN
  Administered 2014-10-05: 650 mg via ORAL
  Filled 2014-10-05 (×2): qty 2

## 2014-10-05 MED ORDER — ONDANSETRON HCL 4 MG/2ML IJ SOLN: 4.0000 mg | Freq: Four times a day (QID) | INTRAMUSCULAR | Status: DC | PRN

## 2014-10-05 MED ORDER — SODIUM CHLORIDE 0.9 % IV SOLN
INTRAVENOUS | Status: AC
  Administered 2014-10-05: 22:00:00 via INTRAVENOUS

## 2014-10-05 MED ORDER — DOCUSATE SODIUM 100 MG PO CAPS
100.0000 mg | ORAL_CAPSULE | Freq: Two times a day (BID) | ORAL | Status: DC
Start: 2014-10-05 — End: 2014-10-06
  Administered 2014-10-05 – 2014-10-06 (×2): 100 mg via ORAL
  Filled 2014-10-05 (×3): qty 1

## 2014-10-05 MED ORDER — ALBUTEROL SULFATE (2.5 MG/3ML) 0.083% IN NEBU: 2.5000 mg | INHALATION_SOLUTION | RESPIRATORY_TRACT | Status: DC | PRN

## 2014-10-05 NOTE — ED Notes (Signed)
Moved into room 33 from hallway. Portable xray being completed.

## 2014-10-05 NOTE — ED Notes (Signed)
Per pt, to call daughter [name] at [phone] or sister Stanton Kidney at [phone] for any question or updates.

## 2014-10-05 NOTE — ED Notes (Signed)
Pt placed on bedpan

## 2014-10-05 NOTE — ED Notes (Signed)
Report attempted 

## 2014-10-05 NOTE — ED Notes (Signed)
Pt was sitting on couch, bent over- fell forward. Pt hit head on wall, then fell back and hit back of head on back wall. Pt said she heard something "pop" in her neck. 10/10 neck pain. Denies LOC. Pt took vicodin at home. CBG 102, 96% on room air, BP 130/96, 86HR. Pt in C collar on arrival

## 2014-10-05 NOTE — ED Notes (Signed)
Maintain SpO2-88-94% per Dr. Ephraim Hamburger and monitor Pulse Ox.

## 2014-10-05 NOTE — ED Notes (Signed)
Pt taken to CT scan.

## 2014-10-05 NOTE — ED Notes (Signed)
Temp. 101.4, Dr. Ralene Bathe aware.

## 2014-10-05 NOTE — Progress Notes (Signed)
Orthopedic Tech Progress Note Patient Details:  [name] [birth year] [id number]  Patient ID: [name], female   DOB: 15-Aug-1940, 74 y.o.   MRN: [id number] Called in bio-tech brace order; spoke with Dolores Lory, Maisyn Nouri 10/05/2014, 3:43 PM

## 2014-10-05 NOTE — ED Notes (Signed)
Placed on oxygen at 2-3 liters to maintain sats above 90%

## 2014-10-05 NOTE — ED Provider Notes (Signed)
CSN: [id number]     Arrival date & time 10/05/14  5093 History   First MD Initiated Contact with Patient 10/05/14 [phone]     Chief Complaint  Patient presents with  . Fall     Patient is a 74 y.o. female presenting with fall. The history is provided by the patient. No language interpreter was used.  Fall   Ms. Deblois presents for evaluation of injuries following a fall. She was at home bending over in her closet when her dog jumped on her and she fell forward, striking her head into the wall.  She felt immediate pain in her head/neck and heard a crack.  She also endorses right upper back pain.  She states that last night she had vomiting and was supposed to see a doctor today to have a black spot removed from her stomach.  She denies any numbness, weakness.  She was able to ambulate after the fall with the assistance of her daughter.   Past Medical History  Diagnosis Date  . Hiatal hernia   . Hypertension   . Anemia   . GERD (gastroesophageal reflux disease)   . Hyperlipidemia   . COPD (chronic obstructive pulmonary disease)   . Depression     major  . Stroke     "mini stroke"  . Osteoarthritis   . Reactive airway disease   . B12 deficiency   . Osteoporosis   . Rupture of bowel   . Bladder incontinence   . DDD (degenerative disc disease), lumbar   . Cataract    Past Surgical History  Procedure Laterality Date  . Abdominal surgery    . Colon surgery    . Joint replacement    . Cholecystectomy    . Abdominal hysterectomy    . Appendectomy    . Bladder surgery    . Rectocele repair    . Ostomy     Family History  Problem Relation Age of Onset  . Heart failure Mother   . Heart attack Father    History  Substance Use Topics  . Smoking status: Former Smoker -- 1.50 packs/day for 30 years  . Smokeless tobacco: Never Used     Comment: quit 2013  . Alcohol Use: No   OB History    No data available     Review of Systems  All other systems reviewed and are  negative.     Allergies  Ambien; Ampicillin-sulbactam sodium; Oxycontin; Percocet; Toradol; and Tramadol  Home Medications   Prior to Admission medications   Medication Sig Start Date End Date Taking? Authorizing Provider  ALPRAZolam Duanne Moron) 1 MG tablet Take 1 mg by mouth at bedtime.    Historical Provider, MD  amLODipine (NORVASC) 5 MG tablet Take 1 tablet by mouth daily. 04/06/13   Historical Provider, MD  capsicum (ZOSTRIX) 0.075 % topical cream Apply 1 application topically 3 (three) times daily.    Historical Provider, MD  citalopram (CELEXA) 40 MG tablet Take 40 mg by mouth daily. Patient take a half of tab daily    Historical Provider, MD  dicyclomine (BENTYL) 20 MG tablet Take 20 mg by mouth 3 (three) times daily as needed for spasms (1 tablet 3 times a day prn).    Historical Provider, MD  esomeprazole (NEXIUM) 40 MG capsule Take 40 mg by mouth 2 (two) times daily.    Historical Provider, MD  HYDROcodone-acetaminophen (VICODIN) 5-500 MG per tablet Take 1 tablet by mouth every 6 (six) hours as needed.  For pain    Historical Provider, MD  lovastatin (MEVACOR) 10 MG tablet Take 10 mg by mouth daily with lunch.    Historical Provider, MD  meclizine (ANTIVERT) 12.5 MG tablet Take 12.5 mg by mouth 3 (three) times daily as needed for nausea.    Historical Provider, MD  ondansetron (ZOFRAN) 4 MG tablet Take 1 tablet (4 mg total) by mouth every 6 (six) hours. 10/17/12   Elyn Peers, MD  polyethylene glycol powder (GLYCOLAX/MIRALAX) powder Take 17 g by mouth daily. For constipation    Historical Provider, MD  promethazine (PHENERGAN) 25 MG suppository Place 25 mg rectally every 6 (six) hours as needed for nausea or vomiting.    Historical Provider, MD  senna (SENOKOT) 8.6 MG tablet Take 1 tablet by mouth daily.    Historical Provider, MD  Soft Lens Products (RA REWETTING DROPS) SOLN 1 application by Does not apply route daily.    Historical Provider, MD  tiZANidine (ZANAFLEX) 4 MG capsule  Take 2 mg by mouth as needed for muscle spasms (1/2 tab at bedtime prn).    Historical Provider, MD  traZODone (DESYREL) 50 MG tablet Take 0.5 tablets (25 mg total) by mouth at bedtime. 02/23/14   Philmore Pali, NP  vitamin B-12 (CYANOCOBALAMIN) 1000 MCG tablet Take 1,000 mcg by mouth daily.    Historical Provider, MD   BP 130/70 mmHg  Pulse 85  Temp(Src) 99.3 F (37.4 C) (Oral)  Resp 18  SpO2 92% Physical Exam  Constitutional: She is oriented to person, place, and time. She appears well-developed and well-nourished.  HENT:  Head: Normocephalic.  Small abrasion over left eye  Eyes: EOM are normal. Pupils are equal, round, and reactive to light.  Neck:  Diffuse cspine tenderness  Cardiovascular: Normal rate and regular rhythm.   Pulmonary/Chest: Effort normal and breath sounds normal. No respiratory distress.  Abdominal: Soft.  Mild epigastric tenderness without guarding/rebound tenderness  Musculoskeletal: She exhibits no edema or tenderness.  Neurological: She is alert and oriented to person, place, and time.  5/5 strength in all four extremities, sensation to light touch intact in all four extremities  Skin: Skin is warm and dry.  Psychiatric: She has a normal mood and affect. Her behavior is normal.  Nursing note and vitals reviewed.   ED Course  Procedures (including critical care time) Labs Review Labs Reviewed  COMPREHENSIVE METABOLIC PANEL - Abnormal; Notable for the following:    Creatinine, Ser 1.24 (*)    GFR calc non Af Amer 42 (*)    GFR calc Af Amer 49 (*)    All other components within normal limits  CBC WITH DIFFERENTIAL/PLATELET - Abnormal; Notable for the following:    Platelets 143 (*)    All other components within normal limits  URINALYSIS, ROUTINE W REFLEX MICROSCOPIC - Abnormal; Notable for the following:    Hgb urine dipstick MODERATE (*)    All other components within normal limits  LIPASE, BLOOD - Abnormal; Notable for the following:    Lipase 18 (*)     All other components within normal limits  URINE MICROSCOPIC-ADD ON - Abnormal; Notable for the following:    Squamous Epithelial / LPF FEW (*)    All other components within normal limits    Imaging Review Ct Head Wo Contrast  10/05/2014   CLINICAL DATA:  Fall.  EXAM: CT HEAD WITHOUT CONTRAST  CT CERVICAL SPINE WITHOUT CONTRAST  TECHNIQUE: Multidetector CT imaging of the head and cervical spine was performed  following the standard protocol without intravenous contrast. Multiplanar CT image reconstructions of the cervical spine were also generated.  COMPARISON:  CT head 06/26/2011  FINDINGS: CT HEAD FINDINGS  Generalized atrophy with mild progression. Mild chronic microvascular ischemic change in the white matter. 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  Negative for acute infarct.  Negative for hemorrhage or mass.  Left globe retinal hemorrhage unchanged. Bilateral staphyloma of the globe unchanged.  Negative for skull fracture.  CT CERVICAL SPINE FINDINGS  Mild compression fracture of C7 appears acute. There is depression of the superior and inferior endplates with a fracture line in the anterior vertebral body best seen on sagittal images. No other fracture  Normal alignment. Mild degenerative changes in the cervical spine. Left-sided facet degeneration throughout the cervical spine. No significant spinal stenosis.  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 This is not causing significant spinal stenosis.  Critical Value/emergent results were called by telephone at the time of interpretation on 10/05/2014 at 10:48 am to Dr. Quintella Reichert , who verbally acknowledged these results.   Electronically Signed   By: Franchot Gallo M.D.   On: 10/05/2014 10:48   Ct Cervical Spine Wo Contrast  10/05/2014   CLINICAL DATA:  Fall.  EXAM: CT HEAD WITHOUT CONTRAST  CT CERVICAL SPINE  WITHOUT CONTRAST  TECHNIQUE: Multidetector CT imaging of the head and cervical spine was performed following the standard protocol without intravenous contrast. Multiplanar CT image reconstructions of the cervical spine were also generated.  COMPARISON:  CT head 06/26/2011  FINDINGS: CT HEAD FINDINGS  Generalized atrophy with mild progression. Mild chronic microvascular ischemic change in the white matter. 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  Negative for acute infarct.  Negative for hemorrhage or mass.  Left globe retinal hemorrhage unchanged. Bilateral staphyloma of the globe unchanged.  Negative for skull fracture.  CT CERVICAL SPINE FINDINGS  Mild compression fracture of C7 appears acute. There is depression of the superior and inferior endplates with a fracture line in the anterior vertebral body best seen on sagittal images. No other fracture  Normal alignment. Mild degenerative changes in the cervical spine. Left-sided facet degeneration throughout the cervical spine. No significant spinal stenosis.  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 This is not causing significant spinal stenosis.  Critical Value/emergent results were called by telephone at the time of interpretation on 10/05/2014 at 10:48 am to Dr. Quintella Reichert , who verbally acknowledged these results.   Electronically Signed   By: Franchot Gallo M.D.   On: 10/05/2014 10:48   Ct Thoracic Spine Wo Contrast  10/05/2014   CLINICAL DATA:  Fall.  C7 fracture  EXAM: CT THORACIC SPINE WITHOUT CONTRAST  TECHNIQUE: Multidetector CT imaging of the thoracic spine was performed without intravenous contrast administration. Multiplanar CT image reconstructions were also generated.  COMPARISON:  CT cervical spine 10/05/2014  FINDINGS: Compression fracture C7 again identified and appears acute. See separate CT cervical spine  report  Mild loss of height T2 and T3 vertebral bodies consistent with mild fracture, possibly acute. No other fracture. No evidence of metastatic disease.  Mild thoracic disc degeneration throughout the cervical thoracic spine. No significant spinal stenosis  Bibasilar atelectasis.  IMPRESSION: Acute fracture C7.  See separate cervical spine CT report  Mild compression fractures T2 and T3  of indeterminate age but possibly acute. MRI may be helpful to date these fractures.  Mild bibasilar atelectasis.   Electronically Signed   By: Franchot Gallo M.D.   On: 10/05/2014 13:05   US Abdomen Complete  10/05/2014   CLINICAL DATA:  74 year old female with abdominal pain (not otherwise specified at the time of this report). Initial encounter.  EXAM: ULTRASOUND ABDOMEN COMPLETE  COMPARISON:  Lumbar spine MRI 03/28/2014. CT Abdomen and Pelvis 10/17/2012.  FINDINGS: Gallbladder: Surgically absent  Common bile duct: Diameter: 11 mm (stable to decreased since 2014)  Liver: Echogenicity within normal limits. Mild intrahepatic biliary ductal dilatation appears stable since 2014. No discrete liver lesion.  IVC: No abnormality visualized.  Pancreas: Visualized portion unremarkable.  Spleen: Size and appearance within normal limits.  Right Kidney: Length: 10.0 cm. Echogenicity within normal limits. No mass or hydronephrosis visualized.  Left Kidney: Length: 10.0 cm. Echogenicity within normal limits. No mass or hydronephrosis visualized.  Abdominal aorta: No aneurysm visualized.  Other findings: None.  IMPRESSION: No acute findings.  Stable biliary tree status post cholecystectomy.   Electronically Signed   By: Genevie Ann M.D.   On: 10/05/2014 14:48   Dg Chest Port 1 View  10/05/2014   CLINICAL DATA:  74 year old female status post fall with pain. Initial encounter.  EXAM: PORTABLE CHEST - 1 VIEW  COMPARISON:  06/15/2014 and earlier.  FINDINGS: Portable AP semi upright view at 1002 hrs. Stable mild cardiomegaly. Other mediastinal  contours are within normal limits. Small calcified granulomas in both upper lobes again noted. No pneumothorax, pulmonary edema, pleural effusion or acute pulmonary opacity. No acute osseous abnormality identified.  IMPRESSION: No acute cardiopulmonary abnormality or acute traumatic injury identified.   Electronically Signed   By: Genevie Ann M.D.   On: 10/05/2014 10:17     EKG Interpretation None      MDM   Final diagnoses:  C7 cervical fracture, closed, initial encounter  Fall, initial encounter  Thoracic compression fracture, closed, initial encounter    Patient here for evaluation of injuries following a mechanical fall. Patient is neurologically and intact on examination. She does have cervical thoracic tenderness, imaging demonstrate acute C7, T2, T3 compression fractures. These are with mild displacement. Discussed the case with Dr. Saintclair Halsted with Neurosurgery  - recommends Cervical Thoracic brace with outpatient follow up in one week.  Pt on chronic vicodin, will not increase home narcotic prescription at this time.    Pt incidentally with complaint of intermittent RUQ pain, had outpatient [place] scheduled today - [place] ordered with no acute abnormality, plan to d/c home with PCP follow up.    1800 - on repeat evaluation for discharge vital signs patient with fever of 101.4, oxygen sats of 85%. Feel patient's has likelihood to decompensate at home, plan to admit to medicine for further management. There is no clear source for fever right now.  Quintella Reichert, MD 10/05/14 [phone]

## 2014-10-05 NOTE — ED Notes (Signed)
Pt returned from US

## 2014-10-05 NOTE — H&P (Signed)
Patient Demographics  [name], is a 74 y.o. female  MRN: [id number]   DOB - 07-11-1940  Admit Date - 10/05/2014  Outpatient Primary MD for the patient is Georgann Housekeeper, MD   With History of -  Past Medical History  Diagnosis Date  . Hiatal hernia   . Hypertension   . Anemia   . GERD (gastroesophageal reflux disease)   . Hyperlipidemia   . COPD (chronic obstructive pulmonary disease)   . Depression     major  . Stroke     "mini stroke"  . Osteoarthritis   . Reactive airway disease   . B12 deficiency   . Osteoporosis   . Rupture of bowel   . Bladder incontinence   . DDD (degenerative disc disease), lumbar   . Cataract       Past Surgical History  Procedure Laterality Date  . Abdominal surgery    . Colon surgery    . Joint replacement    . Cholecystectomy    . Abdominal hysterectomy    . Appendectomy    . Bladder surgery    . Rectocele repair    . Ostomy      in for   Chief Complaint  Patient presents with  . Fall     HPI  [name]  is a 74 y.o. female, with past medical history of COPD, hypertension, hyperlipidemia, history of previous tobacco abuse, currently smoking a cigarettes, patient presents to ED secondary to fall, reports she was bending over in her closet, when her dog jumped on her and she fell forward, striking her head into the wall,, she denies any loss of consciousness, lightheadedness, syncope ,, she had CT head, cervical spine, thoracic spine, significant for C7, T2, T3 compression fracture, ED discussed with Dr. Wynetta [name] from neurosurgery, recommend cervical thoracic brace with outpatient follow-up in one week. Previously discharged from ED patient was noticed to have hypoxia requiring oxygen via nasal cannula, as well he had fever of 101.4, analysis was negative, chest x-ray was significant for bibasilar atelectasis, blood cultures were sent.    Review of Systems    In addition to the HPI above,  No Fever-chills, No Headache,  No changes with Vision or hearing, No problems swallowing food or Liquids, No Chest pain, Cough or Shortness of Breath, No Abdominal pain, No Nausea or Vommitting, Bowel movements are regular, No Blood in stool or Urine, No dysuria, No new skin rashes or bruises, Complaints of neck and upper back pain. No new weakness, tingling, numbness in any extremity, No recent weight gain or loss, No polyuria, polydypsia or polyphagia, No significant Mental Stressors.  A full 10 point Review of Systems was done, except as stated above, all other Review of Systems were negative.   Social History History  Substance Use Topics  . Smoking status: Former Smoker -- 1.50 packs/day for 30 years  . Smokeless tobacco: Never Used     Comment: quit 2013  . Alcohol Use: No     Family History Family History  Problem Relation Age of Onset  . Heart failure Mother   . Heart attack Father      Prior to Admission medications   Medication Sig Start Date End Date Taking? Authorizing Provider  albuterol (PROVENTIL HFA;VENTOLIN HFA) 108 (90 BASE) MCG/ACT inhaler Inhale 1 puff into the lungs every 6 (six) hours as needed for wheezing or shortness of breath.   Yes Historical Provider, MD  ALPRAZolam Prudy Feeler) 1 MG tablet Take  1 mg by mouth at bedtime.   Yes Historical Provider, MD  amLODipine (NORVASC) 5 MG tablet Take 2.5 mg by mouth 2 (two) times daily.  04/06/13  Yes Historical Provider, MD  citalopram (CELEXA) 40 MG tablet Take 20 mg by mouth daily. Patient take a half of tab daily   Yes Historical Provider, MD  dicyclomine (BENTYL) 20 MG tablet Take 20 mg by mouth 3 (three) times daily as needed for spasms (1 tablet 3 times a day prn).   Yes Historical Provider, MD  diphenhydrAMINE (BENADRYL) 25 MG tablet Take 25 mg by mouth every 6 (six) hours as needed for itching.   Yes Historical Provider, MD  esomeprazole (NEXIUM) 40 MG capsule Take 40 mg by mouth 2 (two) times daily.   Yes Historical Provider, MD    gabapentin (NEURONTIN) 300 MG capsule Take 300 mg by mouth 3 (three) times daily.   Yes Historical Provider, MD  HYDROcodone-acetaminophen (VICODIN) 5-500 MG per tablet Take 1 tablet by mouth every 6 (six) hours as needed. For pain   Yes Historical Provider, MD  lovastatin (MEVACOR) 10 MG tablet Take 10 mg by mouth daily with lunch.   Yes Historical Provider, MD  meclizine (ANTIVERT) 12.5 MG tablet Take 12.5 mg by mouth 3 (three) times daily as needed for nausea.   Yes Historical Provider, MD  meloxicam (MOBIC) 7.5 MG tablet Take 7.5 mg by mouth daily.   Yes Historical Provider, MD  polyethylene glycol powder (GLYCOLAX/MIRALAX) powder Take 17 g by mouth daily. For constipation   Yes Historical Provider, MD  senna (SENOKOT) 8.6 MG tablet Take 1 tablet by mouth daily.   Yes Historical Provider, MD  Soft Lens Products (RA REWETTING DROPS) SOLN 1 application by Does not apply route daily.   Yes Historical Provider, MD  tiZANidine (ZANAFLEX) 4 MG capsule Take 2 mg by mouth as needed for muscle spasms (1/2 tab at bedtime prn).   Yes Historical Provider, MD  traZODone (DESYREL) 50 MG tablet Take 0.5 tablets (25 mg total) by mouth at bedtime. 02/23/14  Yes Ronal Fear, NP    Allergies  Allergen Reactions  . Ambien [Zolpidem Tartrate]   . Ampicillin-Sulbactam Sodium Other (See Comments)    Muscles draw up tight - severe headache  . Oxycontin [Oxycodone Hcl]   . Percocet [Oxycodone-Acetaminophen]     Rash/itching  . Toradol [Ketorolac Tromethamine]   . Tramadol     Physical Exam  Vitals  Blood pressure 150/72, pulse 92, temperature 101.4 F (38.6 C), temperature source Oral, resp. rate 16, SpO2 97 %.   1. General elderly female lying in bed in NAD,    2. Normal affect and insight, Not Suicidal or Homicidal,  Alert, Oriented X 3, patient appears to be sleepy, wakes up to verbal stimuli, appropriate.  3. No F.N deficits, ALL C.Nerves Intact, Strength 5/5 all 4 extremities, Sensation intact  all 4 extremities, Plantars down going.  4. Ears and Eyes appear Normal, Conjunctivae clear, PERRLA. Moist Oral Mucosa.  5. Supple Neck, wearing cervical thoracic brace ,No JVD, No cervical lymphadenopathy appriciated, No Carotid Bruits.  6. Symmetrical Chest wall movement, Good air movement bilaterally, CTAB.  7. RRR, No Gallops, Rubs or Murmurs, No Parasternal Heave.  8. Positive Bowel Sounds, Abdomen Soft, No tenderness, No organomegaly appriciated,No rebound -guarding or rigidity.  9.  No Cyanosis, Normal Skin Turgor, No Skin Rash or Bruise.  10. Good muscle tone,  joints appear normal , no effusions, Normal ROM.  11. No  Palpable Lymph Nodes in Neck or Axillae    Data Review  CBC  Recent Labs Lab 10/05/14 1014  WBC 4.3  HGB 14.5  HCT 44.8  PLT 143*  MCV 90.3  MCH 29.2  MCHC 32.4  RDW 14.1  LYMPHSABS 0.7  MONOABS 0.4  EOSABS 0.0  BASOSABS 0.0   ------------------------------------------------------------------------------------------------------------------  Chemistries   Recent Labs Lab 10/05/14 1014  NA 139  K 3.6  CL 103  CO2 25  GLUCOSE 99  BUN 8  CREATININE 1.24*  CALCIUM 9.1  AST 31  ALT 18  ALKPHOS 97  BILITOT 0.8   ------------------------------------------------------------------------------------------------------------------ CrCl cannot be calculated (Unknown ideal weight.). ------------------------------------------------------------------------------------------------------------------ No results for input(s): TSH, T4TOTAL, T3FREE, THYROIDAB in the last 72 hours.  Invalid input(s): FREET3   Coagulation profile No results for input(s): INR, PROTIME in the last 168 hours. ------------------------------------------------------------------------------------------------------------------- No results for input(s): DDIMER in the last 72  hours. -------------------------------------------------------------------------------------------------------------------  Cardiac Enzymes No results for input(s): CKMB, TROPONINI, MYOGLOBIN in the last 168 hours.  Invalid input(s): CK ------------------------------------------------------------------------------------------------------------------ Invalid input(s): POCBNP   ---------------------------------------------------------------------------------------------------------------  Urinalysis    Component Value Date/Time   COLORURINE YELLOW 10/05/2014 1116   APPEARANCEUR CLEAR 10/05/2014 1116   LABSPEC 1.007 10/05/2014 1116   PHURINE 7.5 10/05/2014 1116   GLUCOSEU NEGATIVE 10/05/2014 1116   HGBUR MODERATE* 10/05/2014 1116   BILIRUBINUR NEGATIVE 10/05/2014 1116   KETONESUR NEGATIVE 10/05/2014 1116   PROTEINUR NEGATIVE 10/05/2014 1116   UROBILINOGEN 0.2 10/05/2014 1116   NITRITE NEGATIVE 10/05/2014 1116   LEUKOCYTESUR NEGATIVE 10/05/2014 1116    ----------------------------------------------------------------------------------------------------------------  Imaging results:   Ct Head Wo Contrast  10/05/2014   CLINICAL DATA:  Fall.  EXAM: CT HEAD WITHOUT CONTRAST  CT CERVICAL SPINE WITHOUT CONTRAST  TECHNIQUE: Multidetector CT imaging of the head and cervical spine was performed following the standard protocol without intravenous contrast. Multiplanar CT image reconstructions of the cervical spine were also generated.  COMPARISON:  CT head 06/26/2011  FINDINGS: CT HEAD FINDINGS  Generalized atrophy with mild progression. Mild chronic microvascular ischemic change in the white matter. 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  Negative for acute infarct.  Negative for hemorrhage or mass.  Left globe retinal hemorrhage unchanged. Bilateral staphyloma of the globe unchanged.  Negative for skull fracture.  CT CERVICAL SPINE FINDINGS  Mild compression fracture of C7 appears  acute. There is depression of the superior and inferior endplates with a fracture line in the anterior vertebral body best seen on sagittal images. No other fracture  Normal alignment. Mild degenerative changes in the cervical spine. Left-sided facet degeneration throughout the cervical spine. No significant spinal stenosis.  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 This is not causing significant spinal stenosis.  Critical Value/emergent results were called by telephone at the time of interpretation on 10/05/2014 at 10:48 am to Dr. Tilden Fossa , who verbally acknowledged these results.   Electronically Signed   By: Marlan Palau M.D.   On: 10/05/2014 10:48   Ct Cervical Spine Wo Contrast  10/05/2014   CLINICAL DATA:  Fall.  EXAM: CT HEAD WITHOUT CONTRAST  CT CERVICAL SPINE WITHOUT CONTRAST  TECHNIQUE: Multidetector CT imaging of the head and cervical spine was performed following the standard protocol without intravenous contrast. Multiplanar CT image reconstructions of the cervical spine were also generated.  COMPARISON:  CT head 06/26/2011  FINDINGS: CT HEAD FINDINGS  Generalized atrophy with  mild progression. Mild chronic microvascular ischemic change in the white matter. 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  Negative for acute infarct.  Negative for hemorrhage or mass.  Left globe retinal hemorrhage unchanged. Bilateral staphyloma of the globe unchanged.  Negative for skull fracture.  CT CERVICAL SPINE FINDINGS  Mild compression fracture of C7 appears acute. There is depression of the superior and inferior endplates with a fracture line in the anterior vertebral body best seen on sagittal images. No other fracture  Normal alignment. Mild degenerative changes in the cervical spine. Left-sided facet degeneration throughout the cervical spine. No significant spinal stenosis.   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 This is not causing significant spinal stenosis.  Critical Value/emergent results were called by telephone at the time of interpretation on 10/05/2014 at 10:48 am to Dr. Tilden Fossa , who verbally acknowledged these results.   Electronically Signed   By: Marlan Palau M.D.   On: 10/05/2014 10:48   Ct Thoracic Spine Wo Contrast  10/05/2014   CLINICAL DATA:  Fall.  C7 fracture  EXAM: CT THORACIC SPINE WITHOUT CONTRAST  TECHNIQUE: Multidetector CT imaging of the thoracic spine was performed without intravenous contrast administration. Multiplanar CT image reconstructions were also generated.  COMPARISON:  CT cervical spine 10/05/2014  FINDINGS: Compression fracture C7 again identified and appears acute. See separate CT cervical spine report  Mild loss of height T2 and T3 vertebral bodies consistent with mild fracture, possibly acute. No other fracture. No evidence of metastatic disease.  Mild thoracic disc degeneration throughout the cervical thoracic spine. No significant spinal stenosis  Bibasilar atelectasis.  IMPRESSION: Acute fracture C7.  See separate cervical spine CT report  Mild compression fractures T2 and T3 of indeterminate age but possibly acute. MRI may be helpful to date these fractures.  Mild bibasilar atelectasis.   Electronically Signed   By: Marlan Palau M.D.   On: 10/05/2014 13:05   US Abdomen Complete  10/05/2014   CLINICAL DATA:  74 year old female with abdominal pain (not otherwise specified at the time of this report). Initial encounter.  EXAM: ULTRASOUND ABDOMEN COMPLETE  COMPARISON:  Lumbar spine MRI 03/28/2014. CT Abdomen and Pelvis 10/17/2012.  FINDINGS: Gallbladder: Surgically absent  Common bile duct: Diameter: 11 mm (stable to decreased since 2014)  Liver: Echogenicity within normal limits. Mild intrahepatic biliary  ductal dilatation appears stable since 2014. No discrete liver lesion.  IVC: No abnormality visualized.  Pancreas: Visualized portion unremarkable.  Spleen: Size and appearance within normal limits.  Right Kidney: Length: 10.0 cm. Echogenicity within normal limits. No mass or hydronephrosis visualized.  Left Kidney: Length: 10.0 cm. Echogenicity within normal limits. No mass or hydronephrosis visualized.  Abdominal aorta: No aneurysm visualized.  Other findings: None.  IMPRESSION: No acute findings.  Stable biliary tree status post cholecystectomy.   Electronically Signed   By: Odessa Fleming M.D.   On: 10/05/2014 14:48   Dg Chest Port 1 View  10/05/2014   CLINICAL DATA:  74 year old female status post fall with pain. Initial encounter.  EXAM: PORTABLE CHEST - 1 VIEW  COMPARISON:  06/15/2014 and earlier.  FINDINGS: Portable AP semi upright view at 1002 hrs. Stable mild cardiomegaly. Other mediastinal contours are within normal limits. Small calcified granulomas in both upper lobes again noted. No pneumothorax, pulmonary edema, pleural effusion or acute pulmonary opacity. No acute osseous abnormality identified.  IMPRESSION: No acute cardiopulmonary abnormality  or acute traumatic injury identified.   Electronically Signed   By: Odessa Fleming M.D.   On: 10/05/2014 10:17        Assessment & Plan  Active Problems:   HLD (hyperlipidemia)   HTN (hypertension)   GERD (gastroesophageal reflux disease)   Fever   Hypoxia  Fall with C7, T2, T3 compression fracture - Plan as discussed by ED physician with neurosurgery, is for cervical thoracic brace, with outpatient follow-up in one week. - Kidney with pain medicine as needed. [facility] consult PT  Fever and hypoxia - Will follow on septic workup, urinalysis is negative, chest x-ray showing bibasilar atelectasis, but cultures were sent, these findings most likely secondary to her bibasilar atelectasis, encouraged to use incentive spirometry, and to take a deep  breath. - patient with known history of COPD, patient appears to be mildly sleepy so will avoid saturation more than 93 to prevent CO2 retention narcosis, will check ABG. We'll hold her gabapentin, will decrease her evening Xanax to 0.25 when necessary dose, will hold on pain medicine for now pending ABG results.  History of COPD - No active wheezing, patient is mildly hypoxic, but this is most likely her baseline, her symptoms by her bibasilar atelectasis, continue with when necessary albuterol.  Hyperlipidemia - Continue with statin  Hypertension - Continue with home medication  GERD Continue with PPI   DVT Prophylaxis Heparin -   AM Labs Ordered, also please review Full Orders  Family Communication: Admission, patients condition and plan of care including tests being ordered have been discussed with the patient  who indicate understanding and agree with the plan and Code Status.  Code Status Full  Likely DC to  home when stable  Condition GUARDED    Time spent in minutes : 50 minutes    Olia Hinderliter M.D on 10/05/2014 at 7:04 PM  Between 7am to 7pm - Pager - [phone]  After 7pm go to [email or web address] - password [email or web address]  And look for the night coverage person covering me after hours  [facility] Office  [phone]   **Disclaimer: This note may have been dictated with voice recognition software. Similar sounding words can inadvertently be transcribed and this note may contain transcription errors which may not have been corrected upon publication of note.**

## 2014-10-05 NOTE — ED Notes (Signed)
Dr. Ralene Bathe speaking with pt in the hallway.

## 2014-10-05 NOTE — Discharge Instructions (Signed)
Cervical Spine Fracture, Stable °A cervical spine fracture is a break or crack in one of the bones of the neck. A fracture is stable if the chances of it causing you problems while it is healing are very small. °CAUSES  °· Vehicle accidents. °· Injuries from sports such as diving, football, biking, wrestling, or skiing. °· Occasionally, severe osteoporosis or other bone diseases, such as cancers that spread to bone or metabolic abnormalities. °SYMPTOMS  °· Severe neck pain after an accident or fall. °· Pain down your shoulders or arms. °· Bruising or swelling on the back of your neck. °· Numbness, tingling, muscle spasm, or weakness. °DIAGNOSIS  °Cervical spine fracture is diagnosed with the help of X-ray exams of your neck. Often a CT scan or MRI is used to confirm the diagnosis and help determine how your injury should be treated. Generally, an examination of your neck, arms, and legs, and the history of your injury prompts the health care provider to order these tests.  °TREATMENT  °A stable fracture needs to be treated with a brace or cervical collar. A cervical collar is a two-piece collar designed to keep your neck from moving during the healing process. °HOME CARE INSTRUCTIONS °· Limit physical activity to prevent worsening of the fracture. °· Apply ice to areas of pain 3-4 times a day for 2 days. °¨ Put ice in a bag. °¨ Place a towel between your skin and the bag. °¨ Leave the ice on for 15-20 minutes, 3-4 times a day. °· You may have been given a cervical collar to wear. °¨ Do not remove the collar unless instructed by your health care provider. °¨ If you have long hair, keep it outside of the collar. °¨ Ask your health care provider before making any adjustments to your collar. Minor adjustments may be required over time to improve comfort and reduce pressure on your chin or on the back of your head. °¨ Keep your collar clean by wiping it with mild soap and water and drying it completely. The pads can be  hand washed with soap and water and air dried completely. °¨ If you are allowed to remove the collar for cleaning or bathing, follow your health care provider's instructions on how to do so safely. °¨ If you are allowed to remove the collar for cleaning and bathing, wash and dry the skin of your neck. Check your skin for irritation or sores. If you see any, tell your health care provider. °· Only take over-the-counter or prescription medicines for pain, discomfort, or fever as directed by your health care provider.   °· Keep all follow-up appointments as directed by your health care provider. Not keeping an appointment could result in a chronic or permanent injury, pain, and disability. Additionally, X-rays or an MRI may be repeated 1-3 weeks after your initial appointment. This is to: °¨ Make sure any other breaks or cracks were not missed.   °¨ Help identify stretched or torn ligaments.   °· Get your test results if you did not get them when you were first evaluated. The results will determine whether you need other tests or treatment. It is your responsibility to get the results. °SEEK MEDICAL CARE IF: °You have irritation or sores on your skin from the cervical collar. °SEEK IMMEDIATE MEDICAL CARE IF:  °· You have increasing pain in your neck.   °· You develop difficulties swallowing or breathing. °· You develop swelling in your neck.   °· You have numbness, weakness, burning pain, or movement   problems in the arms or legs.   °· You are unable to control your bowel or bladder (incontinence).   °· You have problems with coordination or difficulty walking. °MAKE SURE YOU:  °· Understand these instructions. °· Will watch your condition. °· Will get help right away if you are not doing well or get worse. °Document Released: 03/30/2004 Document Revised: 05/18/2013 Document Reviewed: 12/07/2012 °[employer]® Patient Information ©2015 [employer]. This information is not intended to replace advice given to you by your  health care provider. Make sure you discuss any questions you have with your health care provider. ° °

## 2014-10-06 DIAGNOSIS — R509 Fever, unspecified: Secondary | ICD-10-CM | POA: Diagnosis not present

## 2014-10-06 DIAGNOSIS — S12600A Unspecified displaced fracture of seventh cervical vertebra, initial encounter for closed fracture: Secondary | ICD-10-CM | POA: Diagnosis not present

## 2014-10-06 DIAGNOSIS — R0902 Hypoxemia: Secondary | ICD-10-CM | POA: Diagnosis not present

## 2014-10-06 LAB — BASIC METABOLIC PANEL
Anion gap: 8 (ref 5–15)
BUN: 6 mg/dL (ref 6–20)
CO2: 24 mmol/L (ref 22–32)
Calcium: 8.8 mg/dL — ABNORMAL LOW (ref 8.9–10.3)
Chloride: 107 mmol/L (ref 101–111)
Creatinine, Ser: 0.99 mg/dL (ref 0.44–1.00)
GFR calc Af Amer: >60 mL/min (ref >60)
GFR calc non Af Amer: 55 mL/min — ABNORMAL LOW (ref >60)
Glucose, Bld: 118 mg/dL — ABNORMAL HIGH (ref 65–99)
Potassium: 3.3 mmol/L — ABNORMAL LOW (ref 3.5–5.1)
Sodium: 139 mmol/L (ref 135–145)

## 2014-10-06 LAB — CBC
HCT: 42.5 % (ref 36.0–46.0)
Hemoglobin: 14 g/dL (ref 12.0–15.0)
MCH: 29.9 pg (ref 26.0–34.0)
MCHC: 32.9 g/dL (ref 30.0–36.0)
MCV: 90.6 fL (ref 78.0–100.0)
Platelets: 116 10*3/uL — ABNORMAL LOW (ref 150–400)
RBC: 4.69 MIL/uL (ref 3.87–5.11)
RDW: 14.2 % (ref 11.5–15.5)
WBC: 2.8 10*3/uL — ABNORMAL LOW (ref 4.0–10.5)

## 2014-10-06 MED ORDER — MORPHINE SULFATE 2 MG/ML IJ SOLN
1.0000 mg | Freq: Once | INTRAMUSCULAR | Status: AC
  Administered 2014-10-06: 1 mg via INTRAVENOUS
  Filled 2014-10-06: qty 1

## 2014-10-06 MED ORDER — DOXYCYCLINE HYCLATE 100 MG PO CAPS: 100.0000 mg | ORAL_CAPSULE | Freq: Two times a day (BID) | ORAL | Status: DC

## 2014-10-06 NOTE — Discharge Summary (Signed)
Discharge Summary  [name] YQM:[id number] DOB: 22-Nov-1940  PCP: Georgann Housekeeper, MD  Admit date: 10/05/2014 Discharge date: 10/06/2014  Time spent: >68mins  Recommendations for Outpatient Follow-up:  1. F/u with PMD within a week, pmd to follow up on culture result.  2. F/u with neurosurgery in a week  Discharge Diagnoses:  Active Hospital Problems   Diagnosis Date Noted  . Fever 10/05/2014  . Hypoxia 10/05/2014  . HLD (hyperlipidemia) 07/30/2013  . HTN (hypertension) 07/30/2013  . GERD (gastroesophageal reflux disease) 07/30/2013    Resolved Hospital Problems   Diagnosis Date Noted Date Resolved  No resolved problems to display.    Discharge Condition: stable  Diet recommendation: heart healthy/carb modified  Filed Weights   10/05/14 2053  Weight: 80.6 kg (177 lb 11.1 oz)    History of present illness:  [name] is a 74 y.o. female, with past medical history of COPD, hypertension, hyperlipidemia, history of previous tobacco abuse, currently smoking a cigarettes, patient presents to ED secondary to fall, reports she was bending over in her closet, when her dog jumped on her and she fell forward, striking her head into the wall,, she denies any loss of consciousness, lightheadedness, syncope ,, she had CT head, cervical spine, thoracic spine, significant for C7, T2, T3 compression fracture, ED discussed with Dr. Wynetta [name] from neurosurgery, recommend cervical thoracic brace with outpatient follow-up in one week. Previously discharged from ED patient was noticed to have hypoxia requiring oxygen via nasal cannula, as well he had fever of 101.4, analysis was negative, chest x-ray was significant for bibasilar atelectasis, blood cultures were sent  Hospital Course:  Active Problems:   HLD (hyperlipidemia)   HTN (hypertension)   GERD (gastroesophageal reflux disease)   Fever   Hypoxia  Fall with C7, T2, T3 compression fracture - Plan as discussed by ED physician with  neurosurgery, is for cervical thoracic brace, with outpatient follow-up in one week. - pain medicine as needed. - arrange home health /PT  Fever and hypoxia,. Resolved. - urinalysis is negative, chest x-ray showing bibasilar atelectasis,  cultures pending, ? bibasilar atelectasis, encouraged to use incentive spirometry, and to take a deep breath. - patient with known history of COPD, initial abg did have mild co2 retention, daughter reported patient received dilaudid and become drowsy after that, currently patient is alert awake, interactive, on room air, lung exam benign.  -she is discharged on oral doxycycline with pmd follow up, repeat chest imaging if fever returns or develop cough.  History of COPD - No active wheezing, patient is mildly hypoxic on admission, might be pain meds related,  -on room air at time of discharge  Hyperlipidemia - Continue with statin  Hypertension - Continue with home medication  GERD Continue with PPI  Frequent falls: arrange home health /PT. Patient is followed by neurology/cardiology for this, patient also reported occasionally tinnitus, encourage outpatient ENT follow up, daughter expressed understanding.  Procedures:  none  Consultations:  neurosurgery by EDP  Discharge Exam: BP 128/67 mmHg  Pulse 88  Temp(Src) 99 F (37.2 C) (Oral)  Resp 18  Ht 5\' 5"  (1.651 m)  Wt 80.6 kg (177 lb 11.1 oz)  BMI 29.57 kg/m2  SpO2 92%  General: AAOx3, NAD Cardiovascular: RRR Respiratory: CTABL  Discharge Instructions You were cared for by a hospitalist during your hospital stay. If you have any questions about your discharge medications or the care you received while you were in the hospital after you are discharged, you can call the unit  and asked to speak with the hospitalist on call if the hospitalist that took care of you is not available. Once you are discharged, your primary care physician will handle any further medical issues. Please note that  NO REFILLS for any discharge medications will be authorized once you are discharged, as it is imperative that you return to your primary care physician (or establish a relationship with a primary care physician if you do not have one) for your aftercare needs so that they can reassess your need for medications and monitor your lab values.  Discharge Instructions    Diet - low sodium heart healthy    Complete by:  As directed      Face-to-face encounter (required for Medicare/Medicaid patients)    Complete by:  As directed   I Vandy Fong certify that this patient is under my care and that I, or a nurse practitioner or physician's assistant working with me, had a face-to-face encounter that meets the physician face-to-face encounter requirements with this patient on 10/06/2014. The encounter with the patient was in whole, or in part for the following medical condition(s) which is the primary reason for home health care (List medical condition): falls  The encounter with the patient was in whole, or in part, for the following medical condition, which is the primary reason for home health care:  falls  I certify that, based on my findings, the following services are medically necessary home health services:  Physical therapy  Reason for Medically Necessary Home Health Services:  Skilled Nursing- Change/Decline in Patient Status  My clinical findings support the need for the above services:  Unsafe ambulation due to balance issues  Further, I certify that my clinical findings support that this patient is homebound due to:  Unsafe ambulation due to balance issues     Home Health    Complete by:  As directed   To provide the following care/treatments:  PT     Increase activity slowly    Complete by:  As directed             Medication List    TAKE these medications        albuterol 108 (90 BASE) MCG/ACT inhaler  Commonly known as:  PROVENTIL HFA;VENTOLIN HFA  Inhale 1 puff into the lungs every 6  (six) hours as needed for wheezing or shortness of breath.     ALPRAZolam 1 MG tablet  Commonly known as:  XANAX  Take 1 mg by mouth at bedtime.     amLODipine 5 MG tablet  Commonly known as:  NORVASC  Take 2.5 mg by mouth 2 (two) times daily.     citalopram 40 MG tablet  Commonly known as:  CELEXA  Take 20 mg by mouth daily. Patient take a half of tab daily     dicyclomine 20 MG tablet  Commonly known as:  BENTYL  Take 20 mg by mouth 3 (three) times daily as needed for spasms (1 tablet 3 times a day prn).     diphenhydrAMINE 25 MG tablet  Commonly known as:  BENADRYL  Take 25 mg by mouth every 6 (six) hours as needed for itching.     doxycycline 100 MG capsule  Commonly known as:  VIBRAMYCIN  Take 1 capsule (100 mg total) by mouth 2 (two) times daily.     esomeprazole 40 MG capsule  Commonly known as:  NEXIUM  Take 40 mg by mouth 2 (two) times daily.  gabapentin 300 MG capsule  Commonly known as:  NEURONTIN  Take 300 mg by mouth 3 (three) times daily.     HYDROcodone-acetaminophen 5-500 MG per tablet  Commonly known as:  VICODIN  Take 1 tablet by mouth every 6 (six) hours as needed. For pain     lovastatin 10 MG tablet  Commonly known as:  MEVACOR  Take 10 mg by mouth daily with lunch.     meclizine 12.5 MG tablet  Commonly known as:  ANTIVERT  Take 12.5 mg by mouth 3 (three) times daily as needed for nausea.     meloxicam 7.5 MG tablet  Commonly known as:  MOBIC  Take 7.5 mg by mouth daily.     polyethylene glycol powder powder  Commonly known as:  GLYCOLAX/MIRALAX  Take 17 g by mouth daily. For constipation     RA REWETTING DROPS Soln  1 application by Does not apply route daily.     senna 8.6 MG tablet  Commonly known as:  SENOKOT  Take 1 tablet by mouth daily.     tiZANidine 4 MG capsule  Commonly known as:  ZANAFLEX  Take 2 mg by mouth as needed for muscle spasms (1/2 tab at bedtime prn).     traZODone 50 MG tablet  Commonly known as:   DESYREL  Take 0.5 tablets (25 mg total) by mouth at bedtime.       Allergies  Allergen Reactions  . Ambien [Zolpidem Tartrate]   . Ampicillin-Sulbactam Sodium Other (See Comments)    Muscles draw up tight - severe headache  . Oxycontin [Oxycodone Hcl]   . Toradol [Ketorolac Tromethamine] Nausea Only  . Tramadol Nausea Only  . Percocet [Oxycodone-Acetaminophen] Itching and Rash    Vicodin is OK       Follow-up Information    Follow up with CRAM,GARY P, MD. Schedule an appointment as soon as possible for a visit in 1 week.   Specialty:  Neurosurgery   Contact information:   [address] [place] [address] [phone]       Follow up with Georgann Housekeeper, MD In 1 week.   Specialty:  Internal Medicine   Why:  hospital discharge follow up   Contact information:   [address] [place] [address] [phone]        The results of significant diagnostics from this hospitalization (including imaging, microbiology, ancillary and laboratory) are listed below for reference.    Significant Diagnostic Studies: Ct Head Wo Contrast  10/05/2014   CLINICAL DATA:  Fall.  EXAM: CT HEAD WITHOUT CONTRAST  CT CERVICAL SPINE WITHOUT CONTRAST  TECHNIQUE: Multidetector CT imaging of the head and cervical spine was performed following the standard protocol without intravenous contrast. Multiplanar CT image reconstructions of the cervical spine were also generated.  COMPARISON:  CT head 06/26/2011  FINDINGS: CT HEAD FINDINGS  Generalized atrophy with mild progression. Mild chronic microvascular ischemic change in the white matter. 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  Negative for acute infarct.  Negative for hemorrhage or mass.  Left globe retinal hemorrhage unchanged. Bilateral staphyloma of the globe unchanged.  Negative for skull fracture.  CT CERVICAL SPINE FINDINGS  Mild compression fracture of C7 appears acute. There is depression of the  superior and inferior endplates with a fracture line in the anterior vertebral body best seen on sagittal images. No other fracture  Normal alignment. Mild degenerative changes in the cervical spine. Left-sided facet degeneration throughout the  cervical spine. No significant spinal stenosis.  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 This is not causing significant spinal stenosis.  Critical Value/emergent results were called by telephone at the time of interpretation on 10/05/2014 at 10:48 am to Dr. Tilden Fossa , who verbally acknowledged these results.   Electronically Signed   By: Marlan Palau M.D.   On: 10/05/2014 10:48   Ct Cervical Spine Wo Contrast  10/05/2014   CLINICAL DATA:  Fall.  EXAM: CT HEAD WITHOUT CONTRAST  CT CERVICAL SPINE WITHOUT CONTRAST  TECHNIQUE: Multidetector CT imaging of the head and cervical spine was performed following the standard protocol without intravenous contrast. Multiplanar CT image reconstructions of the cervical spine were also generated.  COMPARISON:  CT head 06/26/2011  FINDINGS: CT HEAD FINDINGS  Generalized atrophy with mild progression. Mild chronic microvascular ischemic change in the white matter. 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  Negative for acute infarct.  Negative for hemorrhage or mass.  Left globe retinal hemorrhage unchanged. Bilateral staphyloma of the globe unchanged.  Negative for skull fracture.  CT CERVICAL SPINE FINDINGS  Mild compression fracture of C7 appears acute. There is depression of the superior and inferior endplates with a fracture line in the anterior vertebral body best seen on sagittal images. No other fracture  Normal alignment. Mild degenerative changes in the cervical spine. Left-sided facet degeneration throughout the cervical spine. No significant spinal stenosis.  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 This is not causing significant spinal stenosis.  Critical Value/emergent results were called by telephone at the time of interpretation on 10/05/2014 at 10:48 am to Dr. Tilden Fossa , who verbally acknowledged these results.   Electronically Signed   By: Marlan Palau M.D.   On: 10/05/2014 10:48   Ct Thoracic Spine Wo Contrast  10/05/2014   CLINICAL DATA:  Fall.  C7 fracture  EXAM: CT THORACIC SPINE WITHOUT CONTRAST  TECHNIQUE: Multidetector CT imaging of the thoracic spine was performed without intravenous contrast administration. Multiplanar CT image reconstructions were also generated.  COMPARISON:  CT cervical spine 10/05/2014  FINDINGS: Compression fracture C7 again identified and appears acute. See separate CT cervical spine report  Mild loss of height T2 and T3 vertebral bodies consistent with mild fracture, possibly acute. No other fracture. No evidence of metastatic disease.  Mild thoracic disc degeneration throughout the cervical thoracic spine. No significant spinal stenosis  Bibasilar atelectasis.  IMPRESSION: Acute fracture C7.  See separate cervical spine CT report  Mild compression fractures T2 and T3 of indeterminate age but possibly acute. MRI may be helpful to date these fractures.  Mild bibasilar atelectasis.   Electronically Signed   By: Marlan Palau M.D.   On: 10/05/2014 13:05   US Abdomen Complete  10/05/2014   CLINICAL DATA:  74 year old female with abdominal pain (not otherwise specified at the time of this report). Initial encounter.  EXAM: ULTRASOUND ABDOMEN COMPLETE  COMPARISON:  Lumbar spine MRI 03/28/2014. CT Abdomen and Pelvis 10/17/2012.  FINDINGS: Gallbladder: Surgically absent  Common bile duct: Diameter: 11 mm (stable to decreased since 2014)  Liver: Echogenicity within normal limits. Mild intrahepatic biliary ductal dilatation appears stable  since 2014. No discrete liver lesion.  IVC: No abnormality visualized.  Pancreas: Visualized portion unremarkable.  Spleen: Size and appearance within normal limits.  Right Kidney: Length: 10.0 cm. Echogenicity within normal limits. No mass or hydronephrosis visualized.  Left Kidney: Length: 10.0 cm. Echogenicity within normal limits. No mass or hydronephrosis visualized.  Abdominal aorta: No aneurysm visualized.  Other findings: None.  IMPRESSION: No acute findings.  Stable biliary tree status post cholecystectomy.   Electronically Signed   By: Odessa Fleming M.D.   On: 10/05/2014 14:48   Dg Chest Port 1 View  10/05/2014   CLINICAL DATA:  74 year old female status post fall with pain. Initial encounter.  EXAM: PORTABLE CHEST - 1 VIEW  COMPARISON:  06/15/2014 and earlier.  FINDINGS: Portable AP semi upright view at 1002 hrs. Stable mild cardiomegaly. Other mediastinal contours are within normal limits. Small calcified granulomas in both upper lobes again noted. No pneumothorax, pulmonary edema, pleural effusion or acute pulmonary opacity. No acute osseous abnormality identified.  IMPRESSION: No acute cardiopulmonary abnormality or acute traumatic injury identified.   Electronically Signed   By: Odessa Fleming M.D.   On: 10/05/2014 10:17    Microbiology: No results found for this or any previous visit (from the past 240 hour(s)).   Labs: Basic Metabolic Panel:  Recent Labs Lab 10/05/14 1014 10/06/14 0600  NA 139 139  K 3.6 3.3*  CL 103 107  CO2 25 24  GLUCOSE 99 118*  BUN 8 6  CREATININE 1.24* 0.99  CALCIUM 9.1 8.8*   Liver Function Tests:  Recent Labs Lab 10/05/14 1014  AST 31  ALT 18  ALKPHOS 97  BILITOT 0.8  PROT 7.4  ALBUMIN 3.5    Recent Labs Lab 10/05/14 1014  LIPASE 18*   No results for input(s): AMMONIA in the last 168 hours. CBC:  Recent Labs Lab 10/05/14 1014 10/06/14 0600  WBC 4.3 2.8*  NEUTROABS 3.1  --   HGB 14.5 14.0  HCT 44.8 42.5  MCV 90.3 90.6  PLT 143* 116*     Cardiac Enzymes: No results for input(s): CKTOTAL, CKMB, CKMBINDEX, TROPONINI in the last 168 hours. BNP: BNP (last 3 results) No results for input(s): BNP in the last 8760 hours.  ProBNP (last 3 results) No results for input(s): PROBNP in the last 8760 hours.  CBG: No results for input(s): GLUCAP in the last 168 hours.     SignedAlbertine Grates MD, PhD  [facility] 10/06/2014, 12:43 PM

## 2014-10-06 NOTE — Progress Notes (Signed)
Daughter calling out for patient to have more pain medication; informed at this time that she was still in her 6 hour window before she could have another Vicodin and offered Tylenol; refused at this time.  Informed of the time she could have her next dosage and encouraged to relax and try to go back to sleep; informed if pain persist and she can not return to sleep, a return call would be made to on-call provider.  Will continue to monitor.

## 2014-10-06 NOTE — Progress Notes (Signed)
Daughter at nurses station requesting a stronger medication for her mother; patient assessed with complaints of pain when moving. Informed that she was still in her 6 hour prn window and that the on-call provider would be paged again.  Will continue to monitor.

## 2014-10-06 NOTE — Progress Notes (Signed)
Order written by on-call provider for Morphine 38m/0.'5mg'$  IV once.  Given at this time for pain scale of 9.  Daughter at bedside and made aware of order.  Will continue to monitor.

## 2014-10-06 NOTE — Progress Notes (Signed)
Patient discharge teaching given, including activity, diet, follow-up appoints, and medications. Patient verbalized understanding of all discharge instructions. IV access was d/c'd. Vitals are stable. Skin is intact except as charted in most recent assessments. Pt to be escorted out by volunteer, to be driven home by family. 

## 2014-10-06 NOTE — Evaluation (Addendum)
Physical Therapy Evaluation Patient Details Name: [name] MRN: [id number] DOB: [birth year] Today's Date: 10/06/2014   History of Present Illness  74 y.o. female with h/o COPD, CVA, incontinence admitted with a fall and fever. Pt has C2, T2, T3 compression fxs treated with cervical thoracic brace.   Clinical Impression  Pt admitted with above diagnosis. Pt currently with functional limitations due to the deficits listed below (see PT Problem List). Pt ambulated 240' with hand held assist for balance. Her daughter reports pt has had 3-4 falls since October 2015. She would benefit from HHPT to address home safety and balance. Instructed pt/daughter in log roll and recommended pt have hand held assist or use RW when walking. From PT standpoint she is ready to DC home where she has 24* assist from her daughter.  Pt will benefit from skilled PT to increase their independence and safety with mobility to allow discharge to the venue listed below.       Follow Up Recommendations Home health PT    Equipment Recommendations  None recommended by PT    Recommendations for Other Services       Precautions / Restrictions Precautions Precautions: Fall Precaution Comments: has had 3-4 falls since October, 2 were passing out, 1 she tripped, 1 due to dehydration per daughter Required Braces or Orthoses: Cervical Brace Cervical Brace: Hard collar;At all times      Mobility  Bed Mobility Overal bed mobility: Modified Independent             General bed mobility comments: instructed pt/daughter in log roll, pt able to perform independently using bedrail  Transfers Overall transfer level: Modified independent Equipment used: 1 person hand held assist             General transfer comment: min HHA for balance  Ambulation/Gait Ambulation/Gait assistance: Min assist Ambulation Distance (Feet): 240 Feet Assistive device: 1 person hand held assist Gait Pattern/deviations: Step-through  pattern Gait velocity: increased, verbal cues to decr velocity for safety Gait velocity interpretation: at or above normal speed for age/gender General Gait Details: min HHA for balance, encouraged pt/daughter to have HHA or use RW for now when walking  Stairs            Wheelchair Mobility    Modified Rankin (Stroke Patients Only)       Balance                                             Pertinent Vitals/Pain Pain Assessment: No/denies pain    Home Living Family/patient expects to be discharged to:: Private residence Living Arrangements: Children Available Help at Discharge: Family;Available 24 hours/day Type of Home: House Home Access: Stairs to enter   [employer] of Steps: 1 Home Layout: One level Home Equipment: Walker - 2 wheels;Cane - single point;Crutches;Bedside commode;Grab bars - tub/shower Additional Comments: tub shower    Prior Function Level of Independence: Independent               Hand Dominance        Extremity/Trunk Assessment   Upper Extremity Assessment: Overall WFL for tasks assessed           Lower Extremity Assessment: Overall WFL for tasks assessed      Cervical / Trunk Assessment: Normal  Communication   Communication: No difficulties  Cognition Arousal/Alertness: Awake/alert Behavior During Therapy: [facility]  for tasks assessed/performed Overall Cognitive Status: Within Functional Limits for tasks assessed                      General Comments      Exercises        Assessment/Plan    PT Assessment Patient needs continued PT services  PT Diagnosis Difficulty walking   PT Problem List Decreased balance;Decreased mobility;Decreased safety awareness  PT Treatment Interventions Gait training;Therapeutic exercise;Therapeutic activities;Balance training   PT Goals (Current goals can be found in the Care Plan section) Acute Rehab PT Goals Patient Stated Goal: to get home to her  dogs, to ride horses  PT Goal Formulation: With patient/family Time For Goal Achievement: 10/20/14 Potential to Achieve Goals: Good    Frequency Min 3X/week   Barriers to discharge        Co-evaluation               End of Session Equipment Utilized During Treatment: Gait belt Activity Tolerance: Patient tolerated treatment well Patient left: in chair;with call bell/phone within reach;with family/visitor present Nurse Communication: Mobility status    Functional Assessment Tool Used: clinical judgement Functional Limitation: Mobility: Walking and moving around Mobility: Walking and Moving Around Current Status [phone]): At least 1 percent but less than 20 percent impaired, limited or restricted Mobility: Walking and Moving Around Goal Status [phone]): 0 percent impaired, limited or restricted    Time: 0924-0949 PT Time Calculation (min) (ACUTE ONLY): 25 min   Charges:   PT Evaluation $Initial PT Evaluation Tier I: 1 Procedure PT Treatments $Gait Training: 8-22 mins   PT G Codes:   PT G-Codes **NOT FOR INPATIENT CLASS** Functional Assessment Tool Used: clinical judgement Functional Limitation: Mobility: Walking and moving around Mobility: Walking and Moving Around Current Status (D6438): At least 1 percent but less than 20 percent impaired, limited or restricted Mobility: Walking and Moving Around Goal Status [phone]): 0 percent impaired, limited or restricted    Philomena Doheny 10/06/2014, 9:58 AM [phone]

## 2014-10-06 NOTE — Progress Notes (Signed)
Patient complaining of pain in neck and back; no orders in place for pain medication with the exception of Tylenol.  Patient stated that Tylenol does no good.  Made patient aware of report from the ED and that her ABGs would have to be reviewed by on-call before anything could be ordered.  Daughter in at this time and states the patient takes two Vicodin at home; daughter was not happy with this information and proceeded to call patients family doctor.  Again, informed her that I could only call the on-call provider to see what could be done further.  Made comfortable as possible; will continue to monitor.

## 2014-10-06 NOTE — Progress Notes (Signed)
Patient arrived to floor via stretcher and will be admitted to room 5w14; alert and able to make needs known.  Aspen collar in place.  2L O2 in place at this time with no SOB reported.   Made aware of use of call bell system and placed with in reach.  Bed placed in low position; will continue to monitor.

## 2014-10-06 NOTE — Care Management Note (Signed)
Case Management Note  Patient Details  Name: [name] MRN: [id number] Date of Birth: 1940/09/16  Subjective/Objective:                 Pt from home, fell Fx C4, AMS and fever, working up cause of fever.   Action/Plan:  Will follow for discharge needs Expected Discharge Date:                  Expected Discharge Plan:  [place]  In-House Referral:  Clinical Social Work  Discharge planning Services  CM Consult  Post Acute Care Choice:    Choice offered to:     DME Arranged:    DME Agency:     HH Arranged:    [place] Agency:     Status of Service:  In process, will continue to follow  Medicare Important Message Given:  N/A - LOS <3 / Initial given by admissions Date Medicare IM Given:    Medicare IM give by:    Date Additional Medicare IM Given:    Additional Medicare Important Message give by:     If discussed at [place] of Stay Meetings, dates discussed:    Additional Comments:  Carles Collet, RN 10/06/2014, 12:08 PM

## 2014-10-06 NOTE — Progress Notes (Signed)
Return call from on-call provider; order placed for Vicodin 5/325 po q6h prn.  Informed patient and daughter of order; daughter complained that this wasn't going to work for her because she takes more at home.  Took patient Vicodin and also a Xanax for anxiety; patient satisfied with this, but daughter was not.  Made patient comfortable in bed before departure from room; will continue to monitor.

## 2014-10-07 ENCOUNTER — Emergency Department (HOSPITAL_COMMUNITY): Payer: Medicare Other

## 2014-10-07 ENCOUNTER — Inpatient Hospital Stay (HOSPITAL_COMMUNITY)
Admission: EM | Admit: 2014-10-07 | Discharge: 2014-10-10 | DRG: 872 | Disposition: A | Discharge status: Home / Self Care | Payer: Medicare Other | Attending: Internal Medicine | Admitting: Internal Medicine

## 2014-10-07 ENCOUNTER — Other Ambulatory Visit: Payer: Self-pay

## 2014-10-07 ENCOUNTER — Encounter (HOSPITAL_COMMUNITY): Payer: Self-pay

## 2014-10-07 DIAGNOSIS — E876 Hypokalemia: Secondary | ICD-10-CM | POA: Diagnosis not present

## 2014-10-07 DIAGNOSIS — M810 Age-related osteoporosis without current pathological fracture: Secondary | ICD-10-CM | POA: Diagnosis present

## 2014-10-07 DIAGNOSIS — R509 Fever, unspecified: Secondary | ICD-10-CM | POA: Diagnosis not present

## 2014-10-07 DIAGNOSIS — R7881 Bacteremia: Secondary | ICD-10-CM

## 2014-10-07 DIAGNOSIS — I10 Essential (primary) hypertension: Secondary | ICD-10-CM | POA: Diagnosis present

## 2014-10-07 DIAGNOSIS — Z87891 Personal history of nicotine dependence: Secondary | ICD-10-CM | POA: Diagnosis not present

## 2014-10-07 DIAGNOSIS — M5136 Other intervertebral disc degeneration, lumbar region: Secondary | ICD-10-CM | POA: Diagnosis present

## 2014-10-07 DIAGNOSIS — M4852XA Collapsed vertebra, not elsewhere classified, cervical region, initial encounter for fracture: Secondary | ICD-10-CM | POA: Diagnosis not present

## 2014-10-07 DIAGNOSIS — D61818 Other pancytopenia: Secondary | ICD-10-CM | POA: Diagnosis not present

## 2014-10-07 DIAGNOSIS — Z888 Allergy status to other drugs, medicaments and biological substances status: Secondary | ICD-10-CM | POA: Diagnosis not present

## 2014-10-07 DIAGNOSIS — R404 Transient alteration of awareness: Secondary | ICD-10-CM | POA: Diagnosis not present

## 2014-10-07 DIAGNOSIS — Z8673 Personal history of transient ischemic attack (TIA), and cerebral infarction without residual deficits: Secondary | ICD-10-CM | POA: Diagnosis not present

## 2014-10-07 DIAGNOSIS — Y9289 Other specified places as the place of occurrence of the external cause: Secondary | ICD-10-CM | POA: Diagnosis not present

## 2014-10-07 DIAGNOSIS — S22029A Unspecified fracture of second thoracic vertebra, initial encounter for closed fracture: Secondary | ICD-10-CM | POA: Diagnosis present

## 2014-10-07 DIAGNOSIS — D696 Thrombocytopenia, unspecified: Secondary | ICD-10-CM | POA: Diagnosis present

## 2014-10-07 DIAGNOSIS — M1990 Unspecified osteoarthritis, unspecified site: Secondary | ICD-10-CM | POA: Diagnosis present

## 2014-10-07 DIAGNOSIS — D72819 Decreased white blood cell count, unspecified: Secondary | ICD-10-CM | POA: Diagnosis not present

## 2014-10-07 DIAGNOSIS — J449 Chronic obstructive pulmonary disease, unspecified: Secondary | ICD-10-CM | POA: Diagnosis present

## 2014-10-07 DIAGNOSIS — W19XXXA Unspecified fall, initial encounter: Secondary | ICD-10-CM | POA: Diagnosis present

## 2014-10-07 DIAGNOSIS — A419 Sepsis, unspecified organism: Secondary | ICD-10-CM | POA: Diagnosis present

## 2014-10-07 DIAGNOSIS — R531 Weakness: Secondary | ICD-10-CM | POA: Diagnosis not present

## 2014-10-07 DIAGNOSIS — E785 Hyperlipidemia, unspecified: Secondary | ICD-10-CM | POA: Diagnosis present

## 2014-10-07 DIAGNOSIS — J9811 Atelectasis: Secondary | ICD-10-CM | POA: Diagnosis present

## 2014-10-07 DIAGNOSIS — R0902 Hypoxemia: Secondary | ICD-10-CM | POA: Diagnosis present

## 2014-10-07 DIAGNOSIS — J45909 Unspecified asthma, uncomplicated: Secondary | ICD-10-CM | POA: Diagnosis present

## 2014-10-07 DIAGNOSIS — S12600A Unspecified displaced fracture of seventh cervical vertebra, initial encounter for closed fracture: Secondary | ICD-10-CM | POA: Diagnosis present

## 2014-10-07 DIAGNOSIS — S22039A Unspecified fracture of third thoracic vertebra, initial encounter for closed fracture: Secondary | ICD-10-CM | POA: Diagnosis present

## 2014-10-07 DIAGNOSIS — Z90710 Acquired absence of both cervix and uterus: Secondary | ICD-10-CM | POA: Diagnosis not present

## 2014-10-07 DIAGNOSIS — K219 Gastro-esophageal reflux disease without esophagitis: Secondary | ICD-10-CM | POA: Diagnosis present

## 2014-10-07 DIAGNOSIS — D709 Neutropenia, unspecified: Secondary | ICD-10-CM | POA: Diagnosis present

## 2014-10-07 DIAGNOSIS — E860 Dehydration: Secondary | ICD-10-CM | POA: Diagnosis present

## 2014-10-07 DIAGNOSIS — Z9049 Acquired absence of other specified parts of digestive tract: Secondary | ICD-10-CM | POA: Diagnosis present

## 2014-10-07 DIAGNOSIS — E871 Hypo-osmolality and hyponatremia: Secondary | ICD-10-CM | POA: Diagnosis present

## 2014-10-07 DIAGNOSIS — Z880 Allergy status to penicillin: Secondary | ICD-10-CM | POA: Diagnosis not present

## 2014-10-07 DIAGNOSIS — M4854XA Collapsed vertebra, not elsewhere classified, thoracic region, initial encounter for fracture: Secondary | ICD-10-CM | POA: Diagnosis not present

## 2014-10-07 HISTORY — DX: Bacteremia: R78.81

## 2014-10-07 LAB — CBC WITH DIFFERENTIAL/PLATELET
Basophils Absolute: 0 10*3/uL (ref 0.0–0.1)
Basophils Relative: 1 % (ref 0–1)
Eosinophils Absolute: 0 10*3/uL (ref 0.0–0.7)
Eosinophils Relative: 0 % (ref 0–5)
HCT: 42.2 % (ref 36.0–46.0)
Hemoglobin: 13.9 g/dL (ref 12.0–15.0)
Lymphocytes Relative: 18 % (ref 12–46)
Lymphs Abs: 0.4 10*3/uL — ABNORMAL LOW (ref 0.7–4.0)
MCH: 29 pg (ref 26.0–34.0)
MCHC: 32.9 g/dL (ref 30.0–36.0)
MCV: 88.1 fL (ref 78.0–100.0)
Monocytes Absolute: 0.3 10*3/uL (ref 0.1–1.0)
Monocytes Relative: 12 % (ref 3–12)
Neutro Abs: 1.5 10*3/uL — ABNORMAL LOW (ref 1.7–7.7)
Neutrophils Relative %: 69 % (ref 43–77)
Platelets: 72 10*3/uL — ABNORMAL LOW (ref 150–400)
RBC: 4.79 MIL/uL (ref 3.87–5.11)
RDW: 14.1 % (ref 11.5–15.5)
WBC: 2.2 10*3/uL — ABNORMAL LOW (ref 4.0–10.5)

## 2014-10-07 LAB — COMPREHENSIVE METABOLIC PANEL
ALT: 44 U/L (ref 14–54)
AST: 77 U/L — ABNORMAL HIGH (ref 15–41)
Albumin: 3 g/dL — ABNORMAL LOW (ref 3.5–5.0)
Alkaline Phosphatase: 101 U/L (ref 38–126)
Anion gap: 10 (ref 5–15)
BUN: 13 mg/dL (ref 6–20)
CO2: 21 mmol/L — ABNORMAL LOW (ref 22–32)
Calcium: 8.5 mg/dL — ABNORMAL LOW (ref 8.9–10.3)
Chloride: 102 mmol/L (ref 101–111)
Creatinine, Ser: 1.24 mg/dL — ABNORMAL HIGH (ref 0.44–1.00)
GFR calc Af Amer: 49 mL/min — ABNORMAL LOW (ref >60)
GFR calc non Af Amer: 42 mL/min — ABNORMAL LOW (ref >60)
Glucose, Bld: 106 mg/dL — ABNORMAL HIGH (ref 65–99)
Potassium: 3.4 mmol/L — ABNORMAL LOW (ref 3.5–5.1)
Sodium: 133 mmol/L — ABNORMAL LOW (ref 135–145)
Total Bilirubin: 1.1 mg/dL (ref 0.3–1.2)
Total Protein: 6.1 g/dL — ABNORMAL LOW (ref 6.5–8.1)

## 2014-10-07 LAB — HEMOGLOBIN A1C
Hgb A1c MFr Bld: 5.6 % (ref 4.8–5.6)
Mean Plasma Glucose: 114 mg/dL

## 2014-10-07 LAB — URINALYSIS, ROUTINE W REFLEX MICROSCOPIC
Glucose, UA: NEGATIVE mg/dL
Hgb urine dipstick: NEGATIVE
Ketones, ur: NEGATIVE mg/dL
Nitrite: POSITIVE — AB
Protein, ur: 30 mg/dL — AB
Specific Gravity, Urine: 1.02 (ref 1.005–1.030)
Urobilinogen, UA: 1 mg/dL (ref 0.0–1.0)
pH: 6 (ref 5.0–8.0)

## 2014-10-07 LAB — URINE MICROSCOPIC-ADD ON

## 2014-10-07 LAB — I-STAT CG4 LACTIC ACID, ED
Lactic Acid, Venous: 1.11 mmol/L (ref 0.5–2.0)
Lactic Acid, Venous: 0.98 mmol/L (ref 0.5–2.0)

## 2014-10-07 MED ORDER — GABAPENTIN 300 MG PO CAPS
300.0000 mg | ORAL_CAPSULE | Freq: Three times a day (TID) | ORAL | Status: DC
  Administered 2014-10-07 – 2014-10-10 (×8): 300 mg via ORAL
  Filled 2014-10-07 (×10): qty 1

## 2014-10-07 MED ORDER — AMLODIPINE BESYLATE 2.5 MG PO TABS
2.5000 mg | ORAL_TABLET | Freq: Two times a day (BID) | ORAL | Status: DC
  Administered 2014-10-08 – 2014-10-10 (×5): 2.5 mg via ORAL
  Filled 2014-10-07 (×7): qty 1

## 2014-10-07 MED ORDER — VANCOMYCIN HCL IN DEXTROSE 1-5 GM/200ML-% IV SOLN
1000.0000 mg | INTRAVENOUS | Status: DC
  Administered 2014-10-08: 1000 mg via INTRAVENOUS
  Filled 2014-10-07 (×2): qty 200

## 2014-10-07 MED ORDER — TIZANIDINE HCL 2 MG PO TABS
2.0000 mg | ORAL_TABLET | Freq: Every evening | ORAL | Status: DC | PRN
  Filled 2014-10-07: qty 1

## 2014-10-07 MED ORDER — POLYETHYLENE GLYCOL 3350 17 G PO PACK
17.0000 g | PACK | Freq: Every day | ORAL | Status: DC
  Administered 2014-10-08 – 2014-10-10 (×3): 17 g via ORAL
  Filled 2014-10-07 (×3): qty 1

## 2014-10-07 MED ORDER — ALBUTEROL SULFATE (2.5 MG/3ML) 0.083% IN NEBU: 2.5000 mg | INHALATION_SOLUTION | Freq: Four times a day (QID) | RESPIRATORY_TRACT | Status: DC | PRN

## 2014-10-07 MED ORDER — VANCOMYCIN HCL 10 G IV SOLR
2000.0000 mg | Freq: Once | INTRAVENOUS | Status: AC
  Administered 2014-10-07: 2000 mg via INTRAVENOUS
  Filled 2014-10-07: qty 2000

## 2014-10-07 MED ORDER — TRAZODONE 25 MG HALF TABLET
25.0000 mg | ORAL_TABLET | Freq: Every day | ORAL | Status: DC
  Administered 2014-10-07 – 2014-10-09 (×3): 25 mg via ORAL
  Filled 2014-10-07 (×4): qty 1

## 2014-10-07 MED ORDER — SENNA 8.6 MG PO TABS
1.0000 | ORAL_TABLET | Freq: Every day | ORAL | Status: DC
  Administered 2014-10-08 – 2014-10-10 (×3): 8.6 mg via ORAL
  Filled 2014-10-07 (×3): qty 1

## 2014-10-07 MED ORDER — SODIUM CHLORIDE 0.9 % IV SOLN: INTRAVENOUS | Status: AC

## 2014-10-07 MED ORDER — MECLIZINE HCL 12.5 MG PO TABS
12.5000 mg | ORAL_TABLET | Freq: Three times a day (TID) | ORAL | Status: DC | PRN
  Administered 2014-10-09: 12.5 mg via ORAL
  Filled 2014-10-07 (×3): qty 1

## 2014-10-07 MED ORDER — SODIUM CHLORIDE 0.9 % IV BOLUS (SEPSIS)
1000.0000 mL | Freq: Once | INTRAVENOUS | Status: AC
  Administered 2014-10-07: 1000 mL via INTRAVENOUS

## 2014-10-07 MED ORDER — FENTANYL CITRATE (PF) 100 MCG/2ML IJ SOLN
50.0000 ug | Freq: Once | INTRAMUSCULAR | Status: DC
  Filled 2014-10-07: qty 2

## 2014-10-07 MED ORDER — DOXYCYCLINE HYCLATE 100 MG PO TABS
100.0000 mg | ORAL_TABLET | Freq: Two times a day (BID) | ORAL | Status: DC
  Administered 2014-10-07 – 2014-10-10 (×6): 100 mg via ORAL
  Filled 2014-10-07 (×7): qty 1

## 2014-10-07 MED ORDER — PANTOPRAZOLE SODIUM 40 MG PO TBEC
40.0000 mg | DELAYED_RELEASE_TABLET | Freq: Every day | ORAL | Status: DC
  Administered 2014-10-08 – 2014-10-10 (×3): 40 mg via ORAL
  Filled 2014-10-07 (×3): qty 1

## 2014-10-07 MED ORDER — CITALOPRAM HYDROBROMIDE 20 MG PO TABS
20.0000 mg | ORAL_TABLET | Freq: Every day | ORAL | Status: DC
  Administered 2014-10-08 – 2014-10-10 (×3): 20 mg via ORAL
  Filled 2014-10-07 (×3): qty 1

## 2014-10-07 MED ORDER — HYDROCODONE-ACETAMINOPHEN 5-325 MG PO TABS
1.0000 | ORAL_TABLET | Freq: Four times a day (QID) | ORAL | Status: DC | PRN
  Administered 2014-10-07 – 2014-10-10 (×6): 1 via ORAL
  Filled 2014-10-07 (×7): qty 1

## 2014-10-07 MED ORDER — ALPRAZOLAM 0.5 MG PO TABS
1.0000 mg | ORAL_TABLET | Freq: Every day | ORAL | Status: DC
  Administered 2014-10-07 – 2014-10-09 (×3): 1 mg via ORAL
  Filled 2014-10-07 (×3): qty 2

## 2014-10-07 MED ORDER — PRAVASTATIN SODIUM 40 MG PO TABS
40.0000 mg | ORAL_TABLET | Freq: Every day | ORAL | Status: DC
  Administered 2014-10-08 – 2014-10-09 (×2): 40 mg via ORAL
  Filled 2014-10-07 (×3): qty 1

## 2014-10-07 MED ORDER — DICYCLOMINE HCL 20 MG PO TABS
20.0000 mg | ORAL_TABLET | Freq: Three times a day (TID) | ORAL | Status: DC | PRN
  Filled 2014-10-07: qty 1

## 2014-10-07 MED ORDER — LEVOFLOXACIN IN D5W 750 MG/150ML IV SOLN
750.0000 mg | INTRAVENOUS | Status: DC
  Administered 2014-10-07: 750 mg via INTRAVENOUS
  Filled 2014-10-07 (×2): qty 150

## 2014-10-07 MED ORDER — POLYETHYLENE GLYCOL 3350 17 GM/SCOOP PO POWD
17.0000 g | Freq: Every day | ORAL | Status: DC
  Filled 2014-10-07: qty 255

## 2014-10-07 MED ORDER — DIPHENHYDRAMINE HCL 25 MG PO TABS
25.0000 mg | ORAL_TABLET | Freq: Four times a day (QID) | ORAL | Status: DC | PRN
  Filled 2014-10-07: qty 1

## 2014-10-07 NOTE — ED Provider Notes (Signed)
CSN: [id number]     Arrival date & time 10/07/14  1744 History   First MD Initiated Contact with Patient 10/07/14 1754     Chief Complaint  Patient presents with  . Fever   [name] is a 74 y.o. female with a past medical history significant for stroke, COPD, hypertension, GERD, hyperlipidemia, and a recent cervical spine fracture currently in a c-collar and discharged 2 days prior to arrival who presents with fever, mild cough, dysuria, nausea, and vomiting. The patient's come in by her family who are very concerned and reported that the patient was recently admitted and discharged following a fall and cervical spine injury. Following discharge, the patient developed a fever of up to 104. The patient reports that she has continued to have mild nausea and one episode of vomiting as well as mild dysuria and mild cough. The patient specifically denies any headaches, vision changes, chest pain, abdominal pain, constipation, or diarrhea. The patient denies any new rashes. The patient was called by her physician today because a blood culture obtained during her inpatient admission grew gram-positive cocci. The patient was instructed to come to the emergency department for antibiotics and further workup. On arrival, the patient was febrile to 103.7. The patient denied any other symptoms aside from the ones described above and otherwise had no complaints.  (Consider location/radiation/quality/duration/timing/severity/associated sxs/prior Treatment) Patient is a 74 y.o. female presenting with fever. The history is provided by the patient, a relative, medical records and a caregiver. No language interpreter was used.  Fever Max temp prior to arrival:  104 Temp source:  Oral Severity:  Moderate Onset quality:  Gradual Duration:  2 days Timing:  Constant Progression:  Waxing and waning Chronicity:  New Relieved by:  Nothing Worsened by:  Nothing tried Ineffective treatments:  None tried Associated  symptoms: cough, dysuria, nausea and vomiting   Associated symptoms: no chest pain, no chills, no confusion, no congestion, no diarrhea, no headaches, no rash and no rhinorrhea   Cough:    Cough characteristics:  Productive   Sputum characteristics:  Clear   Severity:  Mild   Timing:  Intermittent   Progression:  Waxing and waning Dysuria:    Severity:  Mild   Onset quality:  Gradual   Duration:  2 days   Timing:  Intermittent   Progression:  Waxing and waning Nausea:    Severity:  Mild   Onset quality:  Gradual   Timing:  Intermittent Vomiting:    Quality:  Stomach contents   Severity:  Mild   Past Medical History  Diagnosis Date  . Hiatal hernia   . Hypertension   . Anemia   . GERD (gastroesophageal reflux disease)   . Hyperlipidemia   . COPD (chronic obstructive pulmonary disease)   . Depression     major  . Stroke     "mini stroke"  . Osteoarthritis   . Reactive airway disease   . B12 deficiency   . Osteoporosis   . Rupture of bowel   . Bladder incontinence   . DDD (degenerative disc disease), lumbar   . Cataract    Past Surgical History  Procedure Laterality Date  . Abdominal surgery    . Colon surgery    . Joint replacement    . Cholecystectomy    . Abdominal hysterectomy    . Appendectomy    . Bladder surgery    . Rectocele repair    . Ostomy     Family History  Problem Relation Age of Onset  . Heart failure Mother   . Heart attack Father    History  Substance Use Topics  . Smoking status: Former Smoker -- 1.50 packs/day for 30 years  . Smokeless tobacco: Never Used     Comment: quit 2013  . Alcohol Use: No   OB History    No data available     Review of Systems  Constitutional: Positive for fever. Negative for chills and fatigue.  HENT: Negative for congestion and rhinorrhea.   Eyes: Negative for visual disturbance.  Respiratory: Positive for cough. Negative for chest tightness, shortness of breath, wheezing and stridor.    Cardiovascular: Negative for chest pain and palpitations.  Gastrointestinal: Positive for nausea and vomiting. Negative for abdominal pain, diarrhea and constipation.  Genitourinary: Positive for dysuria. Negative for frequency and flank pain.  Musculoskeletal: Negative for back pain and neck pain.  Skin: Negative for rash and wound.  Neurological: Negative for seizures, weakness, numbness and headaches.  Psychiatric/Behavioral: Negative for confusion and agitation.  All other systems reviewed and are negative.     Allergies  Ambien; Ampicillin-sulbactam sodium; Oxycontin; Toradol; Tramadol; and Percocet  Home Medications   Prior to Admission medications   Medication Sig Start Date End Date Taking? Authorizing Provider  albuterol (PROVENTIL HFA;VENTOLIN HFA) 108 (90 BASE) MCG/ACT inhaler Inhale 1 puff into the lungs every 6 (six) hours as needed for wheezing or shortness of breath.    Historical Provider, MD  ALPRAZolam Duanne Moron) 1 MG tablet Take 1 mg by mouth at bedtime.    Historical Provider, MD  amLODipine (NORVASC) 5 MG tablet Take 2.5 mg by mouth 2 (two) times daily.  04/06/13   Historical Provider, MD  citalopram (CELEXA) 40 MG tablet Take 20 mg by mouth daily. Patient take a half of tab daily    Historical Provider, MD  dicyclomine (BENTYL) 20 MG tablet Take 20 mg by mouth 3 (three) times daily as needed for spasms (1 tablet 3 times a day prn).    Historical Provider, MD  diphenhydrAMINE (BENADRYL) 25 MG tablet Take 25 mg by mouth every 6 (six) hours as needed for itching.    Historical Provider, MD  doxycycline (VIBRAMYCIN) 100 MG capsule Take 1 capsule (100 mg total) by mouth 2 (two) times daily. 10/06/14   Florencia Reasons, MD  esomeprazole (NEXIUM) 40 MG capsule Take 40 mg by mouth 2 (two) times daily.    Historical Provider, MD  gabapentin (NEURONTIN) 300 MG capsule Take 300 mg by mouth 3 (three) times daily.    Historical Provider, MD  HYDROcodone-acetaminophen (VICODIN) 5-500 MG per  tablet Take 1 tablet by mouth every 6 (six) hours as needed. For pain    Historical Provider, MD  lovastatin (MEVACOR) 10 MG tablet Take 10 mg by mouth daily with lunch.    Historical Provider, MD  meclizine (ANTIVERT) 12.5 MG tablet Take 12.5 mg by mouth 3 (three) times daily as needed for nausea.    Historical Provider, MD  meloxicam (MOBIC) 7.5 MG tablet Take 7.5 mg by mouth daily.    Historical Provider, MD  polyethylene glycol powder (GLYCOLAX/MIRALAX) powder Take 17 g by mouth daily. For constipation    Historical Provider, MD  senna (SENOKOT) 8.6 MG tablet Take 1 tablet by mouth daily.    Historical Provider, MD  Soft Lens Products (RA REWETTING DROPS) SOLN 1 application by Does not apply route daily.    Historical Provider, MD  tiZANidine (ZANAFLEX) 4 MG capsule Take 2  mg by mouth as needed for muscle spasms (1/2 tab at bedtime prn).    Historical Provider, MD  traZODone (DESYREL) 50 MG tablet Take 0.5 tablets (25 mg total) by mouth at bedtime. 02/23/14   Philmore Pali, NP   BP 105/50 mmHg  Pulse 86  Temp(Src) 98.6 F (37 C) (Oral)  Resp 24  Ht '5\' 5"'$  (1.651 m)  Wt 186 lb 9.6 oz (84.641 kg)  BMI 31.05 kg/m2  SpO2 94% Physical Exam  Constitutional: She is oriented to person, place, and time. She appears well-developed and well-nourished. No distress.  HENT:  Head: Normocephalic and atraumatic.  Mouth/Throat: No oropharyngeal exudate.  Eyes: Conjunctivae are normal. Pupils are equal, round, and reactive to light. No scleral icterus.  Neck:  Patient in cervical immobilization collar.  Cardiovascular: Normal rate, regular rhythm and intact distal pulses.   No murmur heard. Pulmonary/Chest: Effort normal and breath sounds normal. No stridor. No respiratory distress. She has no wheezes. She exhibits no tenderness.  Abdominal: Soft. Bowel sounds are normal. She exhibits no distension. There is no tenderness. There is no rebound and no guarding.  Musculoskeletal: Normal range of motion. She  exhibits no tenderness.  Neurological: She is alert and oriented to person, place, and time. She displays normal reflexes. No cranial nerve deficit. She exhibits normal muscle tone. Coordination normal.  Skin: Skin is warm. She is not diaphoretic. No pallor.  Psychiatric: She has a normal mood and affect.  Nursing note and vitals reviewed.   ED Course  Procedures (including critical care time) Labs Review Labs Reviewed  COMPREHENSIVE METABOLIC PANEL - Abnormal; Notable for the following:    Sodium 133 (*)    Potassium 3.4 (*)    CO2 21 (*)    Glucose, Bld 106 (*)    Creatinine, Ser 1.24 (*)    Calcium 8.5 (*)    Total Protein 6.1 (*)    Albumin 3.0 (*)    AST 77 (*)    GFR calc non Af Amer 42 (*)    GFR calc Af Amer 49 (*)    All other components within normal limits  CBC WITH DIFFERENTIAL/PLATELET - Abnormal; Notable for the following:    WBC 2.2 (*)    Platelets 72 (*)    Neutro Abs 1.5 (*)    Lymphs Abs 0.4 (*)    All other components within normal limits  URINALYSIS, ROUTINE W REFLEX MICROSCOPIC - Abnormal; Notable for the following:    Color, Urine ORANGE (*)    Bilirubin Urine SMALL (*)    Protein, ur 30 (*)    Nitrite POSITIVE (*)    Leukocytes, UA TRACE (*)    All other components within normal limits  URINE MICROSCOPIC-ADD ON - Abnormal; Notable for the following:    Casts HYALINE CASTS (*)    All other components within normal limits  C-REACTIVE PROTEIN - Abnormal; Notable for the following:    CRP 7.3 (*)    All other components within normal limits  CULTURE, BLOOD (ROUTINE X 2)  CULTURE, BLOOD (ROUTINE X 2)  URINE CULTURE  TROPONIN I  SEDIMENTATION RATE  B. BURGDORFI ANTIBODIES  EHRLICHIA ANTIBODY PANEL  TROPONIN I  TROPONIN I  CBC WITH DIFFERENTIAL/PLATELET  COMPREHENSIVE METABOLIC PANEL  I-STAT CG4 LACTIC ACID, ED  I-STAT CG4 LACTIC ACID, ED    Imaging Review Dg Chest 2 View  10/07/2014   CLINICAL DATA:  Fever.  EXAM: CHEST  2 VIEW   COMPARISON:  Oct 05, 2014.  FINDINGS: Stable cardiomediastinal silhouette. Both lungs are clear. No pneumothorax or pleural effusion is noted. The visualized skeletal structures are unremarkable.  IMPRESSION: No active cardiopulmonary disease.   Electronically Signed   By: Marijo Conception, M.D.   On: 10/07/2014 18:37     EKG Interpretation None      MDM   Final diagnoses:  Fever   [name] is a 74 y.o. female with a past medical history significant for stroke, COPD, hypertension, GERD, hyperlipidemia, and a recent cervical spine fracture currently in a c-collar and discharged 2 days prior to arrival who presents with fever, mild cough, dysuria, nausea, and vomiting. The patient was instructed to come back to the emergency department following the discovery of bacteremia during a recent blood culture. Upon arrival, the patient is febrile and is continuing to complain of a mild cough, mild dysuria, and mild nausea. The patient was given fluids, nausea medicine, and had diagnostic laboratory and imaging studies obtained to look for the source of her infection. The patient had blood cultures were reobtained and she was quickly started on vancomycin to treat the infection.   The results from her initial workup showed trace leukocytes with only rare bacteria, no white blood cells, and positive nitrites in her urine. The patient's CMP appeared improved from prior. The patient's CBC showed a decrease in her white blood cell count of 2.2 and a decrease in her platelet count of 72. The patient's chest x-ray showed no acute cardiopulmonary disease.  Given the patient's continued fever, her symptoms, and her culture positive bacteremia, the patient was started on vancomycin and was admitted to the hospitalist service for inpatient management of her infection. According to the patient's daughter, the patient is very sensitive to pain medicine.   The patient did not have any consultations or problems  while remaining in the emergency department and continued to deny chest pain, headaches, and did not have any further nausea or vomiting while in the ED. The patient was admitted in stable condition.  This patient was seen with Dr. Roxanne Mins, emergency medicine attending.    Antony Blackbird, MD 23/30/07 [id number]  Delora Fuel, MD 33/35/45 [id number]

## 2014-10-07 NOTE — H&P (Signed)
[name] is an 74 y.o. female.     Chief Complaint: fever, dehydration HPI: 74 yo female with fever last nite, brought by family to ED for fever.    Past Medical History  Diagnosis Date  . Hiatal hernia   . Hypertension   . Anemia   . GERD (gastroesophageal reflux disease)   . Hyperlipidemia   . COPD (chronic obstructive pulmonary disease)   . Depression     major  . Stroke     "mini stroke"  . Osteoarthritis   . Reactive airway disease   . B12 deficiency   . Osteoporosis   . Rupture of bowel   . Bladder incontinence   . DDD (degenerative disc disease), lumbar   . Cataract     Past Surgical History  Procedure Laterality Date  . Abdominal surgery    . Colon surgery    . Joint replacement    . Cholecystectomy    . Abdominal hysterectomy    . Appendectomy    . Bladder surgery    . Rectocele repair    . Ostomy      Family History  Problem Relation Age of Onset  . Heart failure Mother   . Heart attack Father    Social History:  reports that she has quit smoking. She has never used smokeless tobacco. She reports that she does not drink alcohol or use illicit drugs.  Allergies:  Allergies  Allergen Reactions  . Ambien [Zolpidem Tartrate]   . Ampicillin-Sulbactam Sodium Other (See Comments)    Muscles draw up tight - severe headache  . Oxycontin [Oxycodone Hcl]   . Toradol [Ketorolac Tromethamine] Nausea Only  . Tramadol Nausea Only  . Percocet [Oxycodone-Acetaminophen] Itching and Rash    Vicodin is OK   Medications reviewed   (Not in a hospital admission)  Results for orders placed or performed during the hospital encounter of 10/07/14 (from the past 48 hour(s))  Comprehensive metabolic panel     Status: Abnormal   Collection Time: 10/07/14  6:00 PM  Result Value Ref Range   Sodium 133 (L) 135 - 145 mmol/L   Potassium 3.4 (L) 3.5 - 5.1 mmol/L   Chloride 102 101 - 111 mmol/L   CO2 21 (L) 22 - 32 mmol/L   Glucose, Bld 106 (H) 65 - 99 mg/dL    BUN 13 6 - 20 mg/dL   Creatinine, Ser 1.24 (H) 0.44 - 1.00 mg/dL   Calcium 8.5 (L) 8.9 - 10.3 mg/dL   Total Protein 6.1 (L) 6.5 - 8.1 g/dL   Albumin 3.0 (L) 3.5 - 5.0 g/dL   AST 77 (H) 15 - 41 U/L   ALT 44 14 - 54 U/L   Alkaline Phosphatase 101 38 - 126 U/L   Total Bilirubin 1.1 0.3 - 1.2 mg/dL   GFR calc non Af Amer 42 (L) >60 mL/min   GFR calc Af Amer 49 (L) >60 mL/min    Comment: (NOTE) The eGFR has been calculated using the CKD EPI equation. This calculation has not been validated in all clinical situations. eGFR's persistently <60 mL/min signify possible Chronic Kidney Disease.    Anion gap 10 5 - 15  CBC with Differential     Status: Abnormal   Collection Time: 10/07/14  6:00 PM  Result Value Ref Range   WBC 2.2 (L) 4.0 - 10.5 K/uL   RBC 4.79 3.87 - 5.11 MIL/uL   Hemoglobin 13.9 12.0 - 15.0 g/dL   HCT 42.2  36.0 - 46.0 %   MCV 88.1 78.0 - 100.0 fL   MCH 29.0 26.0 - 34.0 pg   MCHC 32.9 30.0 - 36.0 g/dL   RDW 14.1 11.5 - 15.5 %   Platelets 72 (L) 150 - 400 K/uL    Comment: CONSISTENT WITH PREVIOUS RESULT   Neutrophils Relative % 69 43 - 77 %   Neutro Abs 1.5 (L) 1.7 - 7.7 K/uL   Lymphocytes Relative 18 12 - 46 %   Lymphs Abs 0.4 (L) 0.7 - 4.0 K/uL   Monocytes Relative 12 3 - 12 %   Monocytes Absolute 0.3 0.1 - 1.0 K/uL   Eosinophils Relative 0 0 - 5 %   Eosinophils Absolute 0.0 0.0 - 0.7 K/uL   Basophils Relative 1 0 - 1 %   Basophils Absolute 0.0 0.0 - 0.1 K/uL  Urinalysis, Routine w reflex microscopic     Status: Abnormal   Collection Time: 10/07/14  6:10 PM  Result Value Ref Range   Color, Urine ORANGE (A) YELLOW    Comment: BIOCHEMICALS MAY BE AFFECTED BY COLOR   APPearance CLEAR CLEAR   Specific Gravity, Urine 1.020 1.005 - 1.030   pH 6.0 5.0 - 8.0   Glucose, UA NEGATIVE NEGATIVE mg/dL   Hgb urine dipstick NEGATIVE NEGATIVE   Bilirubin Urine SMALL (A) NEGATIVE   Ketones, ur NEGATIVE NEGATIVE mg/dL   Protein, ur 30 (A) NEGATIVE mg/dL   Urobilinogen, UA 1.0  0.0 - 1.0 mg/dL   Nitrite POSITIVE (A) NEGATIVE   Leukocytes, UA TRACE (A) NEGATIVE  Urine microscopic-add on     Status: Abnormal   Collection Time: 10/07/14  6:10 PM  Result Value Ref Range   Squamous Epithelial / LPF RARE RARE   WBC, UA 0-2 <3 WBC/hpf   Bacteria, UA RARE RARE   Casts HYALINE CASTS (A) NEGATIVE   Urine-Other MUCOUS PRESENT   I-Stat CG4 Lactic Acid, ED (Not at [facility] or [facility]     Status: None   Collection Time: 10/07/14  6:13 PM  Result Value Ref Range   Lactic Acid, Venous 1.11 0.5 - 2.0 mmol/L   Dg Chest 2 View  10/07/2014   CLINICAL DATA:  Fever.  EXAM: CHEST  2 VIEW  COMPARISON:  Oct 05, 2014.  FINDINGS: Stable cardiomediastinal silhouette. Both lungs are clear. No pneumothorax or pleural effusion is noted. The visualized skeletal structures are unremarkable.  IMPRESSION: No active cardiopulmonary disease.   Electronically Signed   By: Marijo Conception, M.D.   On: 10/07/2014 18:37    Review of Systems  Constitutional: Positive for fever. Negative for chills, weight loss, malaise/fatigue and diaphoresis.  HENT: Negative for congestion, ear discharge, ear pain, hearing loss, nosebleeds, sore throat and tinnitus.   Eyes: Negative for blurred vision, double vision, photophobia, pain, discharge and redness.  Respiratory: Negative for cough, hemoptysis, sputum production, shortness of breath, wheezing and stridor.   Cardiovascular: Negative for chest pain, palpitations, orthopnea, claudication, leg swelling and PND.  Gastrointestinal: Negative for heartburn, nausea, vomiting, abdominal pain, diarrhea, constipation, blood in stool and melena.  Genitourinary: Negative for dysuria, urgency, frequency, hematuria and flank pain.  Musculoskeletal: Positive for neck pain. Negative for myalgias, back pain, joint pain and falls.  Skin: Negative for itching and rash.  Neurological: Negative.  Negative for weakness and headaches.  Endo/Heme/Allergies: Negative.    Psychiatric/Behavioral: Negative.     Blood pressure 100/49, pulse 69, temperature 103.7 F (39.8 C), temperature source Rectal, resp. rate 17, height  '5\' 4"'  (1.626 m), weight 77.111 kg (170 lb), SpO2 97 %. Physical Exam  Constitutional: She is oriented to person, place, and time. She appears well-developed and well-nourished.  HENT:  Head: Normocephalic and atraumatic.  Mouth/Throat: No oropharyngeal exudate.  Eyes: Conjunctivae and EOM are normal. Pupils are equal, round, and reactive to light. No scleral icterus.  Neck: Normal range of motion. Neck supple. No JVD present. No tracheal deviation present. No thyromegaly present.  Cardiovascular: Normal rate and regular rhythm.  Exam reveals no gallop and no friction rub.   No murmur heard. Respiratory: Breath sounds normal. No respiratory distress. She has no wheezes. She has no rales.  GI: Soft. Bowel sounds are normal. She exhibits no distension. There is no tenderness. There is no rebound and no guarding.  Musculoskeletal: Normal range of motion. She exhibits no edema or tenderness.  Lymphadenopathy:    She has no cervical adenopathy.  Neurological: She is alert and oriented to person, place, and time. She has normal reflexes. She displays normal reflexes. No cranial nerve deficit. She exhibits normal muscle tone. Coordination normal.  Skin: Skin is warm and dry. No rash noted. No erythema. No pallor.  Psychiatric: She has a normal mood and affect. Her behavior is normal. Judgment and thought content normal.    No splinter, no jane way, no osler, no conjunctival hemorrhage  Assessment/Plan Fever unclear source (Staph 1/2 bacteremia) Blood culture x2, check esr, crp Check MRI C spine, T spine, consider MRI L spine Check cardiac 2D echo r/o endocarditis Check ehrlichia titer due to leukopenia, thrombocytopenia  Start on vanco iv and levaquin iv pharmacy to dose  ? Tick bite Doxycycline 119m po bid Check lyme  titer  Hyponatremia Hydrate with ns iv Consider further w/up with serum osm, tsh, cortisol, urine sodium, urine osm  Hypokalemia Replete Check cmp in am  Hypertension Cont amlodipine  Hyperlipidemia Cont pravastatin  DVT prophylaxis: scd, no lovenox due to thrombocytopenia    KJani Gravel5/13/2016, 8:10 PM

## 2014-10-07 NOTE — ED Notes (Signed)
Pt was d/c from hospital on 10/05/14 for fall and was dx with a cervical fx.  Pt was called today by [facility] and was told that her blood cultures showed she was septic.  Pt was told to come to ED for further evaluation.  Pt presents to ED with Aspen collar.

## 2014-10-07 NOTE — Progress Notes (Signed)
ANTIBIOTIC CONSULT NOTE - INITIAL  Pharmacy Consult for vancomycin + levaquin  Indication: bacteremia  Allergies  Allergen Reactions  . Ambien [Zolpidem Tartrate]   . Ampicillin-Sulbactam Sodium Other (See Comments)    Muscles draw up tight - severe headache  . Oxycontin [Oxycodone Hcl]   . Toradol [Ketorolac Tromethamine] Nausea Only  . Tramadol Nausea Only  . Percocet [Oxycodone-Acetaminophen] Itching and Rash    Vicodin is OK    Patient Measurements: Height: '5\' 4"'$  (162.6 cm) Weight: 170 lb (77.111 kg) IBW/kg (Calculated) : 54.7 Adjusted Body Weight:   Vital Signs: Temp: 103.7 F (39.8 C) (05/13 1812) Temp Source: Rectal (05/13 1812) BP: 108/56 mmHg (05/13 2023) Pulse Rate: 83 (05/13 2023) Intake/Output from previous day:   Intake/Output from this shift:    Labs:  Recent Labs  10/05/14 1014 10/06/14 0600 10/07/14 1800  WBC 4.3 2.8* 2.2*  HGB 14.5 14.0 13.9  PLT 143* 116* 72*  CREATININE 1.24* 0.99 1.24*   Estimated Creatinine Clearance: 40.6 mL/min (by C-G formula based on Cr of 1.24). No results for input(s): VANCOTROUGH, VANCOPEAK, VANCORANDOM, GENTTROUGH, GENTPEAK, GENTRANDOM, TOBRATROUGH, TOBRAPEAK, TOBRARND, AMIKACINPEAK, AMIKACINTROU, AMIKACIN in the last 72 hours.   Microbiology: Recent Results (from the past 720 hour(s))  Culture, blood (routine x 2)     Status: None (Preliminary result)   Collection Time: 10/05/14  7:45 PM  Result Value Ref Range Status   Specimen Description BLOOD RIGHT HAND  Final   Special Requests BOTTLES DRAWN AEROBIC AND ANAEROBIC 5ML  Final   Culture   Final    GRAM POSITIVE COCCI IN CLUSTERS Note: Gram Stain Report Called to,Read Back By and Verified With: [facility] HUSAIN 10/07/14 AT 49 BY PEAKY Performed at [facility]    Report Status PENDING  Incomplete  Culture, blood (routine x 2)     Status: None (Preliminary result)   Collection Time: 10/05/14  9:43 PM  Result Value Ref Range Status   Specimen  Description BLOOD RIGHT HAND  Final   Special Requests BOTTLES DRAWN AEROBIC AND ANAEROBIC 5CC EA  Final   Culture   Final           BLOOD CULTURE RECEIVED NO GROWTH TO DATE CULTURE WILL BE HELD FOR 5 DAYS BEFORE ISSUING A FINAL NEGATIVE REPORT Performed at [facility]    Report Status PENDING  Incomplete    Medical History: Past Medical History  Diagnosis Date  . Hiatal hernia   . Hypertension   . Anemia   . GERD (gastroesophageal reflux disease)   . Hyperlipidemia   . COPD (chronic obstructive pulmonary disease)   . Depression     major  . Stroke     "mini stroke"  . Osteoarthritis   . Reactive airway disease   . B12 deficiency   . Osteoporosis   . Rupture of bowel   . Bladder incontinence   . DDD (degenerative disc disease), lumbar   . Cataract     Medications:  Anti-infectives    Start     Dose/Rate Route Frequency Ordered Stop   10/08/14 2000  vancomycin (VANCOCIN) IVPB 1000 mg/200 mL premix     1,000 mg 200 mL/hr over 60 Minutes Intravenous Every 24 hours 10/07/14 2050     10/07/14 2200  doxycycline (VIBRA-TABS) tablet 100 mg     100 mg Oral Every 12 hours 10/07/14 2021     10/07/14 2100  levofloxacin (LEVAQUIN) IVPB 750 mg     750 mg 100 mL/hr  over 90 Minutes Intravenous Every 48 hours 10/07/14 2053     10/07/14 1815  vancomycin (VANCOCIN) 2,000 mg in sodium chloride 0.9 % 500 mL IVPB     2,000 mg 250 mL/hr over 120 Minutes Intravenous  Once 10/07/14 1805 10/07/14 2053     Assessment: 38 yof presented to the ED with fever and dehydration and positive blood cultures. Tmax is 103.7, WBC 2.2, Scr slightly elevated at 1.24, LA 1.11  Vanc 5/13>> Levaquin 5/13>>  5/11 Blood - 1/2 GPC in clusters 5/13 Blood - pending 5/13 Urine - pending  Goal of Therapy:  Vancomycin trough level 15-20 mcg/ml  Plan:  - Vanc 2gm IV x 1 (per MD) then 1gm IV Q24H - Levaquin '750mg'$  IV Q48H - F/u renal fxn, C&S, clinical status and trough at [place], [name] 10/07/2014,8:54 PM

## 2014-10-08 ENCOUNTER — Inpatient Hospital Stay (HOSPITAL_COMMUNITY): Payer: Medicare Other

## 2014-10-08 DIAGNOSIS — D696 Thrombocytopenia, unspecified: Secondary | ICD-10-CM

## 2014-10-08 DIAGNOSIS — D72819 Decreased white blood cell count, unspecified: Secondary | ICD-10-CM

## 2014-10-08 DIAGNOSIS — A419 Sepsis, unspecified organism: Secondary | ICD-10-CM

## 2014-10-08 DIAGNOSIS — R7881 Bacteremia: Secondary | ICD-10-CM

## 2014-10-08 DIAGNOSIS — R509 Fever, unspecified: Secondary | ICD-10-CM

## 2014-10-08 HISTORY — DX: Sepsis, unspecified organism: A41.9

## 2014-10-08 LAB — COMPREHENSIVE METABOLIC PANEL
ALT: 42 U/L (ref 14–54)
AST: 78 U/L — ABNORMAL HIGH (ref 15–41)
Albumin: 2.5 g/dL — ABNORMAL LOW (ref 3.5–5.0)
Alkaline Phosphatase: 90 U/L (ref 38–126)
Anion gap: 6 (ref 5–15)
BUN: 11 mg/dL (ref 6–20)
CO2: 24 mmol/L (ref 22–32)
Calcium: 8.4 mg/dL — ABNORMAL LOW (ref 8.9–10.3)
Chloride: 104 mmol/L (ref 101–111)
Creatinine, Ser: 1.07 mg/dL — ABNORMAL HIGH (ref 0.44–1.00)
GFR calc Af Amer: 58 mL/min — ABNORMAL LOW (ref >60)
GFR calc non Af Amer: 50 mL/min — ABNORMAL LOW (ref >60)
Glucose, Bld: 107 mg/dL — ABNORMAL HIGH (ref 65–99)
Potassium: 3.3 mmol/L — ABNORMAL LOW (ref 3.5–5.1)
Sodium: 134 mmol/L — ABNORMAL LOW (ref 135–145)
Total Bilirubin: 1.1 mg/dL (ref 0.3–1.2)
Total Protein: 5.7 g/dL — ABNORMAL LOW (ref 6.5–8.1)

## 2014-10-08 LAB — CBC WITH DIFFERENTIAL/PLATELET
Basophils Absolute: 0.1 10*3/uL (ref 0.0–0.1)
Basophils Relative: 4 % — ABNORMAL HIGH (ref 0–1)
Eosinophils Absolute: 0 10*3/uL (ref 0.0–0.7)
Eosinophils Relative: 0 % (ref 0–5)
HCT: 42.8 % (ref 36.0–46.0)
Hemoglobin: 14.6 g/dL (ref 12.0–15.0)
Lymphocytes Relative: 35 % (ref 12–46)
Lymphs Abs: 1.1 10*3/uL (ref 0.7–4.0)
MCH: 30.3 pg (ref 26.0–34.0)
MCHC: 34.1 g/dL (ref 30.0–36.0)
MCV: 88.8 fL (ref 78.0–100.0)
Monocytes Absolute: 0.5 10*3/uL (ref 0.1–1.0)
Monocytes Relative: 16 % — ABNORMAL HIGH (ref 3–12)
Neutro Abs: 1.3 10*3/uL — ABNORMAL LOW (ref 1.7–7.7)
Neutrophils Relative %: 45 % (ref 43–77)
Platelets: 101 10*3/uL — ABNORMAL LOW (ref 150–400)
RBC: 4.82 MIL/uL (ref 3.87–5.11)
RDW: 14.4 % (ref 11.5–15.5)
WBC: 3 10*3/uL — ABNORMAL LOW (ref 4.0–10.5)

## 2014-10-08 LAB — C-REACTIVE PROTEIN: CRP: 7.3 mg/dL — ABNORMAL HIGH (ref <1.0)

## 2014-10-08 LAB — SEDIMENTATION RATE: Sed Rate: 7 mm/hr (ref 0–22)

## 2014-10-08 LAB — URINE CULTURE
Colony Count: NO GROWTH
Culture: NO GROWTH

## 2014-10-08 LAB — CULTURE, BLOOD (ROUTINE X 2)

## 2014-10-08 LAB — TROPONIN I
Troponin I: <0.03 ng/mL (ref <0.031)
Troponin I: <0.03 ng/mL (ref <0.031)

## 2014-10-08 MED ORDER — POTASSIUM CHLORIDE CRYS ER 20 MEQ PO TBCR
40.0000 meq | EXTENDED_RELEASE_TABLET | Freq: Four times a day (QID) | ORAL | Status: AC
  Administered 2014-10-08 (×2): 40 meq via ORAL
  Filled 2014-10-08 (×2): qty 2

## 2014-10-08 NOTE — Progress Notes (Signed)
Echocardiogram 2D Echocardiogram has been performed.  Tresa Res 10/08/2014, 3:32 PM

## 2014-10-08 NOTE — Progress Notes (Signed)
Utilization review completed.  

## 2014-10-08 NOTE — Progress Notes (Signed)
[facility] PROGRESS NOTE   [name] IZT:[id number] DOB: 02-17-1941 DOA: 10/07/2014 PCP: Wenda Low, MD  HPI/Subjective: Denies fever chills, had some pain in her neck, wears her [place] collar  Assessment/Plan: Active Problems:   Fever   Bacteremia   Leukopenia   Thrombocytopenia   Sepsis/septicemia Presented with temperature of 103.7 Respiratory rate of 23 and presence of bacteremia. Blood culture showed 1/2 bottles positive for GPC in clusters. Patient started on broad-spectrum antibiotics, Zosyn and vancomycin. If it turned out to be staph will consult ID, continue current antibiotics. CXR negative for acute events, MR of C and T-spine showed no evidence of infection. 2-D echo pending.  Recent fall with C7, T2 and T3 compression fractures Patient isn't complaining about some pain, control pain with narcotics, continue [place] collar.  Leukopenia Presented with leukopenia at 2.2, with mild neutropenia, ANC count is 1.3. Follow CBC with differential in a.m.  ? Tick bite Patient is on doxycycline 100 mg by mouth twice a day, Ehrlichia and Lyme disease titers to be checked.  Hyponatremia Started on IV fluids, urine sodium and plasma osmolality pending. Anyway mild hyponatremia with sodium of 133.  Hypokalemia Mild hypokalemia, potassium 3.3, replete with oral supplements.  HTN Continue home medications.  Code Status: Prior Family Communication: Plan discussed with the patient. Disposition Plan: Remains inpatient Diet:    Consultants:  None  Procedures:  None  Antibiotics:  None   Objective: Filed Vitals:   10/08/14 0944  BP: 144/66  Pulse: 81  Temp: 98.3 F (36.8 C)  Resp: 18    Intake/Output Summary (Last 24 hours) at 10/08/14 1136 Last data filed at 10/08/14 0700  Gross per 24 hour  Intake      0 ml  Output    400 ml  Net   -400 ml   Filed Weights   10/07/14 1759 10/07/14 2125  Weight: 77.111 kg (170 lb) 84.641 kg (186 lb  9.6 oz)    Exam: General: Alert and awake, oriented x3, not in any acute distress. HEENT: anicteric sclera, pupils reactive to light and accommodation, EOMI CVS: S1-S2 clear, no murmur rubs or gallops Chest: clear to auscultation bilaterally, no wheezing, rales or rhonchi Abdomen: soft nontender, nondistended, normal bowel sounds, no organomegaly Extremities: no cyanosis, clubbing or edema noted bilaterally Neuro: Cranial nerves II-XII intact, no focal neurological deficits  Data Reviewed: Basic Metabolic Panel:  Recent Labs Lab 10/05/14 1014 10/06/14 0600 10/07/14 1800 10/08/14 1001  NA 139 139 133* 134*  K 3.6 3.3* 3.4* 3.3*  CL 103 107 102 104  CO2 25 24 21* 24  GLUCOSE 99 118* 106* 107*  BUN '8 6 13 11  '$ CREATININE 1.24* 0.99 1.24* 1.07*  CALCIUM 9.1 8.8* 8.5* 8.4*   Liver Function Tests:  Recent Labs Lab 10/05/14 1014 10/07/14 1800 10/08/14 1001  AST 31 77* 78*  ALT 18 44 42  ALKPHOS 97 101 90  BILITOT 0.8 1.1 1.1  PROT 7.4 6.1* 5.7*  ALBUMIN 3.5 3.0* 2.5*    Recent Labs Lab 10/05/14 1014  LIPASE 18*   No results for input(s): AMMONIA in the last 168 hours. CBC:  Recent Labs Lab 10/05/14 1014 10/06/14 0600 10/07/14 1800 10/08/14 0554  WBC 4.3 2.8* 2.2* 3.0*  NEUTROABS 3.1  --  1.5* 1.3*  HGB 14.5 14.0 13.9 14.6  HCT 44.8 42.5 42.2 42.8  MCV 90.3 90.6 88.1 88.8  PLT 143* 116* 72* 101*   Cardiac Enzymes:  Recent Labs Lab 10/07/14 2329 10/08/14 1001  TROPONINI <0.03 <0.03   BNP (last 3 results) No results for input(s): BNP in the last 8760 hours.  ProBNP (last 3 results) No results for input(s): PROBNP in the last 8760 hours.  CBG: No results for input(s): GLUCAP in the last 168 hours.  Micro Recent Results (from the past 240 hour(s))  Culture, blood (routine x 2)     Status: None   Collection Time: 10/05/14  7:45 PM  Result Value Ref Range Status   Specimen Description BLOOD RIGHT HAND  Final   Special Requests BOTTLES DRAWN  AEROBIC AND ANAEROBIC 5ML  Final   Culture   Final    STAPHYLOCOCCUS SPECIES (COAGULASE NEGATIVE) Note: THE SIGNIFICANCE OF ISOLATING THIS ORGANISM FROM A SINGLE SET OF BLOOD CULTURES WHEN MULTIPLE SETS ARE DRAWN IS UNCERTAIN. PLEASE NOTIFY THE MICROBIOLOGY DEPARTMENT WITHIN ONE WEEK IF SPECIATION AND SENSITIVITIES ARE REQUIRED. Note: Gram Stain Report Called to,Read Back By and Verified With: [facility] HUSAIN 10/07/14 AT 79 BY PEAKY Performed at [facility]    Report Status 10/08/2014 FINAL  Final  Culture, blood (routine x 2)     Status: None (Preliminary result)   Collection Time: 10/05/14  9:43 PM  Result Value Ref Range Status   Specimen Description BLOOD RIGHT HAND  Final   Special Requests BOTTLES DRAWN AEROBIC AND ANAEROBIC 5CC EA  Final   Culture   Final           BLOOD CULTURE RECEIVED NO GROWTH TO DATE CULTURE WILL BE HELD FOR 5 DAYS BEFORE ISSUING A FINAL NEGATIVE REPORT Performed at [facility]    Report Status PENDING  Incomplete     Studies: Dg Chest 2 View  10/07/2014   CLINICAL DATA:  Fever.  EXAM: CHEST  2 VIEW  COMPARISON:  Oct 05, 2014.  FINDINGS: Stable cardiomediastinal silhouette. Both lungs are clear. No pneumothorax or pleural effusion is noted. The visualized skeletal structures are unremarkable.  IMPRESSION: No active cardiopulmonary disease.   Electronically Signed   By: Marijo Conception, M.D.   On: 10/07/2014 18:37   Mr Cervical Spine Wo Contrast  10/08/2014   CLINICAL DATA:  Known cervical spine fracture, new onset fever. Here for assessment of sepsis.  EXAM: MRI CERVICAL AND LUMBAR SPINE WITHOUT CONTRAST  TECHNIQUE: Multiplanar and multiecho pulse sequences of the cervical spine, to include the craniocervical junction and cervicothoracic junction, and thoracic spine, were obtained without intravenous contrast. Please note, motion degraded examination. Per technologist note, patient did not wish to receive intravenous contrast due to pain,  examination is noncontrast.  COMPARISON:  CT of the cervical spine Oct 05, 2014  FINDINGS: MRI CERVICAL SPINE FINDINGS  Motion degraded examination.  Moderate C7 compression fracture, low T1 and bright T2 signal with 30-50% height loss. No retropulsed bony fragments. Remaining cervical vertebral bodies appear intact. Maintenance of cervical lordosis. Intervertebral discs demonstrate normal morphology, decreased T2 signal within all cervical disc most consistent with desiccation.  Low signal measuring up to 6 mm in AP dimension posterior to the odontoid process consistent with pannus, can be seen with CPPD without erosions or edema. Cervical spinal cord appears normal morphology and signal characteristics though, motion degrades sensitivity for subtle cord signal abnormality. No syrinx. Included prevertebral and paraspinal soft tissues are normal.  Level by level evaluation:  C2-3: Moderate facet arthropathy. No disc bulge, canal stenosis or neural foraminal narrowing.  C3-4: Annular bulging, uncovertebral hypertrophy. Moderate LEFT greater than RIGHT facet arthropathy. Very mild canal stenosis. Mild  LEFT neural foraminal narrowing.  C4-5: Small broad-based disc bulge, mild to moderate facet arthropathy and uncovertebral hypertrophy. Moderate canal stenosis. Moderate to severe LEFT greater than RIGHT neural foraminal narrowing.  C5-6: Small broad-based disc bulge, uncovertebral hypertrophy and moderate facet arthropathy. Mild canal stenosis. Moderate RIGHT, moderate to severe LEFT neural foraminal narrowing.  C6-7: Severely limited by motion and habitus. No definite disc bulge. At least moderate facet arthropathy without canal stenosis or neural foraminal narrowing.  C7-T1: No definite disc bulge, canal stenosis. At least mild facet arthropathy without neural foraminal narrowing.  MRI THORACIC SPINE FINDINGS  Moderate acute appearing T2 compression fracture with 30-50% height loss. Mild acute appearing T3 compression  fracture. The remaining thoracic vertebral bodies appear intact. Maintenance of thoracic kyphosis. Intervertebral discs demonstrate relatively preserved morphology and signal characteristics. Minimal subacute to chronic discogenic endplate change of lower thoracic spine. 13 mm T6 hemangioma. Scattered chronic Schmorl's nodes.  Thoracic spinal cord appears normal in morphology and signal characteristics of the level the conus medullaris which terminates at T12-L1. Motion decreases sensitivity for potential cord signal abnormality.  No significant disc bulge, canal stenosis or neural foraminal narrowing any thoracic level.  IMPRESSION: Please note, examination was ordered with contrast though, after noncontrast portion, patient was unable to tolerate imaging due to pain and contrast-enhanced sequences not performed.  MRI CERVICAL SPINE: Acute moderate C7 compression fracture. No malalignment.  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 Multilevel neural foraminal narrowing: Moderate to severe on the LEFT at C4-5 and C5-6.  MRI THORACIC SPINE: Acute moderate T2 compression fracture. Mild acute T3 compression fracture. No malalignment.   Electronically Signed   By: Elon Alas   On: 10/08/2014 04:30   Mr Thoracic Spine Wo Contrast  10/08/2014   CLINICAL DATA:  Known cervical spine fracture, new onset fever. Here for assessment of sepsis.  EXAM: MRI CERVICAL AND LUMBAR SPINE WITHOUT CONTRAST  TECHNIQUE: Multiplanar and multiecho pulse sequences of the cervical spine, to include the craniocervical junction and cervicothoracic junction, and thoracic spine, were obtained without intravenous contrast. Please note, motion degraded examination. Per technologist note, patient did not wish to receive intravenous contrast due to pain, examination is noncontrast.  COMPARISON:  CT of the cervical spine Oct 05, 2014  FINDINGS: MRI CERVICAL SPINE FINDINGS  Motion degraded  examination.  Moderate C7 compression fracture, low T1 and bright T2 signal with 30-50% height loss. No retropulsed bony fragments. Remaining cervical vertebral bodies appear intact. Maintenance of cervical lordosis. Intervertebral discs demonstrate normal morphology, decreased T2 signal within all cervical disc most consistent with desiccation.  Low signal measuring up to 6 mm in AP dimension posterior to the odontoid process consistent with pannus, can be seen with CPPD without erosions or edema. Cervical spinal cord appears normal morphology and signal characteristics though, motion degrades sensitivity for subtle cord signal abnormality. No syrinx. Included prevertebral and paraspinal soft tissues are normal.  Level by level evaluation:  C2-3: Moderate facet arthropathy. No disc bulge, canal stenosis or neural foraminal narrowing.  C3-4: Annular bulging, uncovertebral hypertrophy. Moderate LEFT greater than RIGHT facet arthropathy. Very mild canal stenosis. Mild LEFT neural foraminal narrowing.  C4-5: Small broad-based disc bulge, mild to moderate facet arthropathy and uncovertebral hypertrophy. Moderate canal stenosis. Moderate to severe LEFT greater than RIGHT neural foraminal narrowing.  C5-6: Small broad-based disc bulge, uncovertebral hypertrophy and moderate facet arthropathy. Mild canal stenosis. Moderate RIGHT, moderate to severe LEFT neural foraminal narrowing.  C6-7: Severely  limited by motion and habitus. No definite disc bulge. At least moderate facet arthropathy without canal stenosis or neural foraminal narrowing.  C7-T1: No definite disc bulge, canal stenosis. At least mild facet arthropathy without neural foraminal narrowing.  MRI THORACIC SPINE FINDINGS  Moderate acute appearing T2 compression fracture with 30-50% height loss. Mild acute appearing T3 compression fracture. The remaining thoracic vertebral bodies appear intact. Maintenance of thoracic kyphosis. Intervertebral discs demonstrate  relatively preserved morphology and signal characteristics. Minimal subacute to chronic discogenic endplate change of lower thoracic spine. 13 mm T6 hemangioma. Scattered chronic Schmorl's nodes.  Thoracic spinal cord appears normal in morphology and signal characteristics of the level the conus medullaris which terminates at T12-L1. Motion decreases sensitivity for potential cord signal abnormality.  No significant disc bulge, canal stenosis or neural foraminal narrowing any thoracic level.  IMPRESSION: Please note, examination was ordered with contrast though, after noncontrast portion, patient was unable to tolerate imaging due to pain and contrast-enhanced sequences not performed.  MRI CERVICAL SPINE: Acute moderate C7 compression fracture. No malalignment.  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 Multilevel neural foraminal narrowing: Moderate to severe on the LEFT at C4-5 and C5-6.  MRI THORACIC SPINE: Acute moderate T2 compression fracture. Mild acute T3 compression fracture. No malalignment.   Electronically Signed   By: Elon Alas   On: 10/08/2014 04:30    Scheduled Meds: . sodium chloride   Intravenous STAT  . ALPRAZolam  1 mg Oral QHS  . amLODipine  2.5 mg Oral BID  . citalopram  20 mg Oral Daily  . doxycycline  100 mg Oral Q12H  . gabapentin  300 mg Oral TID  . levofloxacin (LEVAQUIN) IV  750 mg Intravenous Q48H  . pantoprazole  40 mg Oral Daily  . polyethylene glycol  17 g Oral Daily  . pravastatin  40 mg Oral q1800  . senna  1 tablet Oral Daily  . traZODone  25 mg Oral QHS  . vancomycin  1,000 mg Intravenous Q24H   Continuous Infusions:      Time spent: 35 minutes    [facility]  [facility] Pager [phone] If 7PM-7AM, please contact night-coverage at [email or web address], password [facility] 10/08/2014, 11:36 AM  LOS: 1 day

## 2014-10-08 NOTE — Evaluation (Signed)
Physical Therapy Evaluation Patient Details Name: [name] MRN: [id number] DOB: [birth year] Today's Date: 10/08/2014   History of Present Illness  74 y.o. female with h/o COPD, CVA, incontinence admitted with a fall and fever. Pt has C2, T2, T3 compression fxs treated with cervical thoracic brace. Discharged on 10/06/14 and returned the next day with fever. Being checked for bacteremia.  Clinical Impression  Pt admitted with above diagnosis. Pt currently with functional limitations due to the deficits listed below (see PT Problem List). Pt ambulated 200' with min-guard A with RW. Recommend PT for increasing safety with mobility and improving balance.  Pt will benefit from skilled PT to increase their independence and safety with mobility to allow discharge to the venue listed below.       Follow Up Recommendations Home health PT    Equipment Recommendations  None recommended by PT    Recommendations for Other Services       Precautions / Restrictions Precautions Precautions: Fall Precaution Comments: has had 3-4 falls since October, 2 were passing out, 1 she tripped, 1 due to dehydration per daughter Required Braces or Orthoses: Cervical Brace ([place]) Cervical Brace: Hard collar;At all times Restrictions Weight Bearing Restrictions: No      Mobility  Bed Mobility Overal bed mobility: Modified Independent             General bed mobility comments: vc's for log roll  Transfers Overall transfer level: Modified independent Equipment used: 1 person hand held assist             General transfer comment: min A to steady, stood from multiple surfaces, no LOB noted, however balance reactions diminshed due to neck brace and limited ROM  Ambulation/Gait Ambulation/Gait assistance: Min guard Ambulation Distance (Feet): 200 Feet Assistive device: Rolling walker (2 wheeled) Gait Pattern/deviations: Step-through pattern Gait velocity: WFL Gait velocity interpretation:  at or above normal speed for age/gender General Gait Details: vc's for safety with RW and vc's for steering RW in straight line  Stairs            Wheelchair Mobility    Modified Rankin (Stroke Patients Only)       Balance Overall balance assessment: Needs assistance;History of Falls Sitting-balance support: Feet supported;No upper extremity supported Sitting balance-Leahy Scale: Good     Standing balance support: No upper extremity supported;During functional activity Standing balance-Leahy Scale: Fair Standing balance comment: pt has h/o falls and now has ROM limiting brace that increases risk of falls further. Pt can maintain static standing without UE support but with diminished balance reactions, requires UE support for safe dynamic activity                              Pertinent Vitals/Pain Pain Assessment: Faces Faces Pain Scale: Hurts little more Pain Location: back Pain Descriptors / Indicators: Aching Pain Intervention(s): Monitored during session    Home Living Family/patient expects to be discharged to:: Private residence Living Arrangements: Children Available Help at Discharge: Family;Available 24 hours/day Type of Home: House Home Access: Stairs to enter   [employer] of Steps: 1 Home Layout: One level Home Equipment: Walker - 2 wheels;Cane - single point;Crutches;Bedside commode;Grab bars - tub/shower Additional Comments: tub shower    Prior Function Level of Independence: Independent         Comments: needs help now because of brace     Hand Dominance        Extremity/Trunk Assessment  Upper Extremity Assessment: Overall WFL for tasks assessed           Lower Extremity Assessment: Overall WFL for tasks assessed      Cervical / Trunk Assessment: Normal  Communication   Communication: No difficulties  Cognition Arousal/Alertness: Awake/alert Behavior During Therapy: WFL for tasks  assessed/performed Overall Cognitive Status: Within Functional Limits for tasks assessed                      General Comments General comments (skin integrity, edema, etc.): pt had brace on against skin, removed brace and assisted pt in donning a thin T-shirt and reapplied brace.     Exercises        Assessment/Plan    PT Assessment Patient needs continued PT services  PT Diagnosis Difficulty walking;Acute pain   PT Problem List Decreased balance;Decreased mobility;Decreased safety awareness;Decreased knowledge of use of DME;Decreased knowledge of precautions;Pain  PT Treatment Interventions DME instruction;Gait training;Stair training;Functional mobility training;Therapeutic activities;Therapeutic exercise;Balance training;Patient/family education   PT Goals (Current goals can be found in the Care Plan section) Acute Rehab PT Goals Patient Stated Goal: to get home to her dogs, to ride horses  PT Goal Formulation: With patient/family Time For Goal Achievement: 10/22/14 Potential to Achieve Goals: Good    Frequency Min 3X/week   Barriers to discharge        Co-evaluation               End of Session Equipment Utilized During Treatment: Gait belt Activity Tolerance: Patient tolerated treatment well Patient left: in chair;with call bell/phone within reach Nurse Communication: Mobility status         Time: 1157-2620 PT Time Calculation (min) (ACUTE ONLY): 30 min   Charges:   PT Evaluation $Initial PT Evaluation Tier I: 1 Procedure PT Treatments $Gait Training: 8-22 mins   PT G Codes:       Leighton Roach, PT  Acute Rehab Services  [phone]  Leighton Roach 10/08/2014, 4:01 PM

## 2014-10-09 DIAGNOSIS — A419 Sepsis, unspecified organism: Principal | ICD-10-CM

## 2014-10-09 LAB — CBC WITH DIFFERENTIAL/PLATELET
Basophils Absolute: 0.1 10*3/uL (ref 0.0–0.1)
Basophils Relative: 5 % — ABNORMAL HIGH (ref 0–1)
Eosinophils Absolute: 0 10*3/uL (ref 0.0–0.7)
Eosinophils Relative: 1 % (ref 0–5)
HCT: 38.4 % (ref 36.0–46.0)
Hemoglobin: 10.7 g/dL — ABNORMAL LOW (ref 12.0–15.0)
Lymphocytes Relative: 52 % — ABNORMAL HIGH (ref 12–46)
Lymphs Abs: 1.6 10*3/uL (ref 0.7–4.0)
MCH: 24.6 pg — ABNORMAL LOW (ref 26.0–34.0)
MCHC: 27.9 g/dL — ABNORMAL LOW (ref 30.0–36.0)
MCV: 88.3 fL (ref 78.0–100.0)
Monocytes Absolute: 0.6 10*3/uL (ref 0.1–1.0)
Monocytes Relative: 23 % — ABNORMAL HIGH (ref 3–12)
Neutro Abs: 0.5 10*3/uL — ABNORMAL LOW (ref 1.7–7.7)
Neutrophils Relative %: 19 % — ABNORMAL LOW (ref 43–77)
Platelets: 62 10*3/uL — ABNORMAL LOW (ref 150–400)
RBC: 4.35 MIL/uL (ref 3.87–5.11)
RDW: 14.4 % (ref 11.5–15.5)
WBC: 2.8 10*3/uL — ABNORMAL LOW (ref 4.0–10.5)

## 2014-10-09 LAB — BASIC METABOLIC PANEL
Anion gap: 9 (ref 5–15)
BUN: 10 mg/dL (ref 6–20)
CO2: 22 mmol/L (ref 22–32)
Calcium: 9.3 mg/dL (ref 8.9–10.3)
Chloride: 108 mmol/L (ref 101–111)
Creatinine, Ser: 0.92 mg/dL (ref 0.44–1.00)
GFR calc Af Amer: >60 mL/min (ref >60)
GFR calc non Af Amer: >60 mL/min (ref >60)
Glucose, Bld: 94 mg/dL (ref 65–99)
Potassium: 3.9 mmol/L (ref 3.5–5.1)
Sodium: 139 mmol/L (ref 135–145)

## 2014-10-09 MED ORDER — LEVOFLOXACIN IN D5W 500 MG/100ML IV SOLN
500.0000 mg | INTRAVENOUS | Status: DC
  Administered 2014-10-09: 500 mg via INTRAVENOUS
  Filled 2014-10-09: qty 100

## 2014-10-09 MED ORDER — VANCOMYCIN HCL IN DEXTROSE 750-5 MG/150ML-% IV SOLN
750.0000 mg | Freq: Two times a day (BID) | INTRAVENOUS | Status: DC
  Administered 2014-10-09: 750 mg via INTRAVENOUS
  Filled 2014-10-09: qty 150

## 2014-10-09 NOTE — Progress Notes (Signed)
[facility] PROGRESS NOTE   [name] VPX:[id number] DOB: 1940/12/29 DOA: 10/07/2014 PCP: Wenda Low, MD  HPI/Subjective: Denies any fever or chills, feels OK, Spoke with daughter over the phone, I answered her questions.  Assessment/Plan: Active Problems:   Fever   Bacteremia   Leukopenia   Thrombocytopenia   Sepsis   Sepsis Presented with temperature of 103.7 Respiratory rate of 23 and suspicion of infection. Blood culture showed 1/2 bottles positive for Coagulase negative Staph Patient started on broad-spectrum antibiotics, Zosyn and vancomycin. I will discontinue Vanc and Zosyn and watch on Doxycycline. CXR negative for acute events, MR of C and T-spine showed no evidence of infection. 2-D echo pending. If doing OK in AM may be discharged  Recent fall with C7, T2 and T3 compression fractures Patient isn't complaining about some pain, control pain with narcotics, continue [place] collar.  Leukopenia Presented with leukopenia at 2.2, with mild neutropenia, ANC count is 1.3. Follow CBC with differential in a.m.  ? Tick bite Patient is on doxycycline 100 mg by mouth twice a day, Ehrlichia and Lyme disease titers pending.  Hyponatremia Presented with sodium of 133 improved after IVF hydration  Hypokalemia Mild hypokalemia, potassium 3.3, replete with oral supplements.  HTN Continue home medications.  Code Status: Prior Family Communication: Plan discussed with the patient. Disposition Plan: Remains inpatient Diet: Diet regular Room service appropriate?: Yes; Fluid consistency:: Thin  Consultants:  None  Procedures:  None  Antibiotics:  None   Objective: Filed Vitals:   10/09/14 0942  BP: 147/86  Pulse: 111  Temp: 97.8 F (36.6 C)  Resp: 20    Intake/Output Summary (Last 24 hours) at 10/09/14 1327 Last data filed at 10/09/14 0600  Gross per 24 hour  Intake    240 ml  Output      0 ml  Net    240 ml   Filed Weights   10/07/14  1759 10/07/14 2125 10/08/14 2107  Weight: 77.111 kg (170 lb) 84.641 kg (186 lb 9.6 oz) 85.2 kg (187 lb 13.3 oz)    Exam: General: Alert and awake, oriented x3, not in any acute distress. HEENT: anicteric sclera, pupils reactive to light and accommodation, EOMI CVS: S1-S2 clear, no murmur rubs or gallops Chest: clear to auscultation bilaterally, no wheezing, rales or rhonchi Abdomen: soft nontender, nondistended, normal bowel sounds, no organomegaly Extremities: no cyanosis, clubbing or edema noted bilaterally Neuro: Cranial nerves II-XII intact, no focal neurological deficits  Data Reviewed: Basic Metabolic Panel:  Recent Labs Lab 10/05/14 1014 10/06/14 0600 10/07/14 1800 10/08/14 1001 10/09/14 0703  NA 139 139 133* 134* 139  K 3.6 3.3* 3.4* 3.3* 3.9  CL 103 107 102 104 108  CO2 25 24 21* 24 22  GLUCOSE 99 118* 106* 107* 94  BUN '8 6 13 11 10  '$ CREATININE 1.24* 0.99 1.24* 1.07* 0.92  CALCIUM 9.1 8.8* 8.5* 8.4* 9.3   Liver Function Tests:  Recent Labs Lab 10/05/14 1014 10/07/14 1800 10/08/14 1001  AST 31 77* 78*  ALT 18 44 42  ALKPHOS 97 101 90  BILITOT 0.8 1.1 1.1  PROT 7.4 6.1* 5.7*  ALBUMIN 3.5 3.0* 2.5*    Recent Labs Lab 10/05/14 1014  LIPASE 18*   No results for input(s): AMMONIA in the last 168 hours. CBC:  Recent Labs Lab 10/05/14 1014 10/06/14 0600 10/07/14 1800 10/08/14 0554 10/09/14 0703  WBC 4.3 2.8* 2.2* 3.0* 2.8*  NEUTROABS 3.1  --  1.5* 1.3* 0.5*  HGB 14.5 14.0  13.9 14.6 10.7*  HCT 44.8 42.5 42.2 42.8 38.4  MCV 90.3 90.6 88.1 88.8 88.3  PLT 143* 116* 72* 101* 62*   Cardiac Enzymes:  Recent Labs Lab 10/07/14 2329 10/08/14 1001  TROPONINI <0.03 <0.03   BNP (last 3 results) No results for input(s): BNP in the last 8760 hours.  ProBNP (last 3 results) No results for input(s): PROBNP in the last 8760 hours.  CBG: No results for input(s): GLUCAP in the last 168 hours.  Micro Recent Results (from the past 240 hour(s))    Culture, blood (routine x 2)     Status: None   Collection Time: 10/05/14  7:45 PM  Result Value Ref Range Status   Specimen Description BLOOD RIGHT HAND  Final   Special Requests BOTTLES DRAWN AEROBIC AND ANAEROBIC 5ML  Final   Culture   Final    STAPHYLOCOCCUS SPECIES (COAGULASE NEGATIVE) Note: THE SIGNIFICANCE OF ISOLATING THIS ORGANISM FROM A SINGLE SET OF BLOOD CULTURES WHEN MULTIPLE SETS ARE DRAWN IS UNCERTAIN. PLEASE NOTIFY THE MICROBIOLOGY DEPARTMENT WITHIN ONE WEEK IF SPECIATION AND SENSITIVITIES ARE REQUIRED. Note: Gram Stain Report Called to,Read Back By and Verified With: [facility] HUSAIN 10/07/14 AT 60 BY PEAKY Performed at [facility]    Report Status 10/08/2014 FINAL  Final  Culture, blood (routine x 2)     Status: None (Preliminary result)   Collection Time: 10/05/14  9:43 PM  Result Value Ref Range Status   Specimen Description BLOOD RIGHT HAND  Final   Special Requests BOTTLES DRAWN AEROBIC AND ANAEROBIC 5CC EA  Final   Culture   Final           BLOOD CULTURE RECEIVED NO GROWTH TO DATE CULTURE WILL BE HELD FOR 5 DAYS BEFORE ISSUING A FINAL NEGATIVE REPORT Performed at [facility]    Report Status PENDING  Incomplete  Culture, blood (routine x 2)     Status: None (Preliminary result)   Collection Time: 10/07/14  6:00 PM  Result Value Ref Range Status   Specimen Description BLOOD RIGHT HAND  Final   Special Requests BOTTLES DRAWN AEROBIC AND ANAEROBIC 5CC  Final   Culture   Final           BLOOD CULTURE RECEIVED NO GROWTH TO DATE CULTURE WILL BE HELD FOR 5 DAYS BEFORE ISSUING A FINAL NEGATIVE REPORT Performed at [facility]    Report Status PENDING  Incomplete  Culture, blood (routine x 2)     Status: None (Preliminary result)   Collection Time: 10/07/14  6:05 PM  Result Value Ref Range Status   Specimen Description BLOOD RIGHT ANTECUBITAL  Final   Special Requests BOTTLES DRAWN AEROBIC AND ANAEROBIC 5CC  Final   Culture   Final            BLOOD CULTURE RECEIVED NO GROWTH TO DATE CULTURE WILL BE HELD FOR 5 DAYS BEFORE ISSUING A FINAL NEGATIVE REPORT Performed at [facility]    Report Status PENDING  Incomplete  Urine culture     Status: None   Collection Time: 10/07/14  6:10 PM  Result Value Ref Range Status   Specimen Description URINE, CATHETERIZED  Final   Special Requests NONE  Final   Colony Count NO GROWTH Performed at [facility]   Final   Culture NO GROWTH Performed at [facility]   Final   Report Status 10/08/2014 FINAL  Final     Studies: Dg Chest 2 View  10/07/2014   CLINICAL DATA:  Fever.  EXAM: CHEST  2 VIEW  COMPARISON:  Oct 05, 2014.  FINDINGS: Stable cardiomediastinal silhouette. Both lungs are clear. No pneumothorax or pleural effusion is noted. The visualized skeletal structures are unremarkable.  IMPRESSION: No active cardiopulmonary disease.   Electronically Signed   By: Marijo Conception, M.D.   On: 10/07/2014 18:37   Mr Cervical Spine Wo Contrast  10/08/2014   CLINICAL DATA:  Known cervical spine fracture, new onset fever. Here for assessment of sepsis.  EXAM: MRI CERVICAL AND LUMBAR SPINE WITHOUT CONTRAST  TECHNIQUE: Multiplanar and multiecho pulse sequences of the cervical spine, to include the craniocervical junction and cervicothoracic junction, and thoracic spine, were obtained without intravenous contrast. Please note, motion degraded examination. Per technologist note, patient did not wish to receive intravenous contrast due to pain, examination is noncontrast.  COMPARISON:  CT of the cervical spine Oct 05, 2014  FINDINGS: MRI CERVICAL SPINE FINDINGS  Motion degraded examination.  Moderate C7 compression fracture, low T1 and bright T2 signal with 30-50% height loss. No retropulsed bony fragments. Remaining cervical vertebral bodies appear intact. Maintenance of cervical lordosis. Intervertebral discs demonstrate normal morphology, decreased T2 signal within all cervical  disc most consistent with desiccation.  Low signal measuring up to 6 mm in AP dimension posterior to the odontoid process consistent with pannus, can be seen with CPPD without erosions or edema. Cervical spinal cord appears normal morphology and signal characteristics though, motion degrades sensitivity for subtle cord signal abnormality. No syrinx. Included prevertebral and paraspinal soft tissues are normal.  Level by level evaluation:  C2-3: Moderate facet arthropathy. No disc bulge, canal stenosis or neural foraminal narrowing.  C3-4: Annular bulging, uncovertebral hypertrophy. Moderate LEFT greater than RIGHT facet arthropathy. Very mild canal stenosis. Mild LEFT neural foraminal narrowing.  C4-5: Small broad-based disc bulge, mild to moderate facet arthropathy and uncovertebral hypertrophy. Moderate canal stenosis. Moderate to severe LEFT greater than RIGHT neural foraminal narrowing.  C5-6: Small broad-based disc bulge, uncovertebral hypertrophy and moderate facet arthropathy. Mild canal stenosis. Moderate RIGHT, moderate to severe LEFT neural foraminal narrowing.  C6-7: Severely limited by motion and habitus. No definite disc bulge. At least moderate facet arthropathy without canal stenosis or neural foraminal narrowing.  C7-T1: No definite disc bulge, canal stenosis. At least mild facet arthropathy without neural foraminal narrowing.  MRI THORACIC SPINE FINDINGS  Moderate acute appearing T2 compression fracture with 30-50% height loss. Mild acute appearing T3 compression fracture. The remaining thoracic vertebral bodies appear intact. Maintenance of thoracic kyphosis. Intervertebral discs demonstrate relatively preserved morphology and signal characteristics. Minimal subacute to chronic discogenic endplate change of lower thoracic spine. 13 mm T6 hemangioma. Scattered chronic Schmorl's nodes.  Thoracic spinal cord appears normal in morphology and signal characteristics of the level the conus medullaris  which terminates at T12-L1. Motion decreases sensitivity for potential cord signal abnormality.  No significant disc bulge, canal stenosis or neural foraminal narrowing any thoracic level.  IMPRESSION: Please note, examination was ordered with contrast though, after noncontrast portion, patient was unable to tolerate imaging due to pain and contrast-enhanced sequences not performed.  MRI CERVICAL SPINE: Acute moderate C7 compression fracture. No malalignment.  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 Multilevel neural foraminal narrowing: Moderate to severe on the LEFT at C4-5 and C5-6.  MRI THORACIC SPINE: Acute moderate T2 compression fracture. Mild acute T3 compression fracture. No malalignment.   Electronically Signed   By: Sandie Ano  Bloomer   On: 10/08/2014 04:30   Mr Thoracic Spine Wo Contrast  10/08/2014   CLINICAL DATA:  Known cervical spine fracture, new onset fever. Here for assessment of sepsis.  EXAM: MRI CERVICAL AND LUMBAR SPINE WITHOUT CONTRAST  TECHNIQUE: Multiplanar and multiecho pulse sequences of the cervical spine, to include the craniocervical junction and cervicothoracic junction, and thoracic spine, were obtained without intravenous contrast. Please note, motion degraded examination. Per technologist note, patient did not wish to receive intravenous contrast due to pain, examination is noncontrast.  COMPARISON:  CT of the cervical spine Oct 05, 2014  FINDINGS: MRI CERVICAL SPINE FINDINGS  Motion degraded examination.  Moderate C7 compression fracture, low T1 and bright T2 signal with 30-50% height loss. No retropulsed bony fragments. Remaining cervical vertebral bodies appear intact. Maintenance of cervical lordosis. Intervertebral discs demonstrate normal morphology, decreased T2 signal within all cervical disc most consistent with desiccation.  Low signal measuring up to 6 mm in AP dimension posterior to the odontoid process consistent with  pannus, can be seen with CPPD without erosions or edema. Cervical spinal cord appears normal morphology and signal characteristics though, motion degrades sensitivity for subtle cord signal abnormality. No syrinx. Included prevertebral and paraspinal soft tissues are normal.  Level by level evaluation:  C2-3: Moderate facet arthropathy. No disc bulge, canal stenosis or neural foraminal narrowing.  C3-4: Annular bulging, uncovertebral hypertrophy. Moderate LEFT greater than RIGHT facet arthropathy. Very mild canal stenosis. Mild LEFT neural foraminal narrowing.  C4-5: Small broad-based disc bulge, mild to moderate facet arthropathy and uncovertebral hypertrophy. Moderate canal stenosis. Moderate to severe LEFT greater than RIGHT neural foraminal narrowing.  C5-6: Small broad-based disc bulge, uncovertebral hypertrophy and moderate facet arthropathy. Mild canal stenosis. Moderate RIGHT, moderate to severe LEFT neural foraminal narrowing.  C6-7: Severely limited by motion and habitus. No definite disc bulge. At least moderate facet arthropathy without canal stenosis or neural foraminal narrowing.  C7-T1: No definite disc bulge, canal stenosis. At least mild facet arthropathy without neural foraminal narrowing.  MRI THORACIC SPINE FINDINGS  Moderate acute appearing T2 compression fracture with 30-50% height loss. Mild acute appearing T3 compression fracture. The remaining thoracic vertebral bodies appear intact. Maintenance of thoracic kyphosis. Intervertebral discs demonstrate relatively preserved morphology and signal characteristics. Minimal subacute to chronic discogenic endplate change of lower thoracic spine. 13 mm T6 hemangioma. Scattered chronic Schmorl's nodes.  Thoracic spinal cord appears normal in morphology and signal characteristics of the level the conus medullaris which terminates at T12-L1. Motion decreases sensitivity for potential cord signal abnormality.  No significant disc bulge, canal stenosis or  neural foraminal narrowing any thoracic level.  IMPRESSION: Please note, examination was ordered with contrast though, after noncontrast portion, patient was unable to tolerate imaging due to pain and contrast-enhanced sequences not performed.  MRI CERVICAL SPINE: Acute moderate C7 compression fracture. No malalignment.  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 Multilevel neural foraminal narrowing: Moderate to severe on the LEFT at C4-5 and C5-6.  MRI THORACIC SPINE: Acute moderate T2 compression fracture. Mild acute T3 compression fracture. No malalignment.   Electronically Signed   By: Elon Alas   On: 10/08/2014 04:30    Scheduled Meds: . ALPRAZolam  1 mg Oral QHS  . amLODipine  2.5 mg Oral BID  . citalopram  20 mg Oral Daily  . doxycycline  100 mg Oral Q12H  . gabapentin  300 mg Oral TID  . levofloxacin (LEVAQUIN) IV  500 mg  Intravenous Q24H  . pantoprazole  40 mg Oral Daily  . polyethylene glycol  17 g Oral Daily  . pravastatin  40 mg Oral q1800  . senna  1 tablet Oral Daily  . traZODone  25 mg Oral QHS  . vancomycin  750 mg Intravenous Q12H   Continuous Infusions:      Time spent: 35 minutes    [facility]  [facility] Pager [phone] If 7PM-7AM, please contact night-coverage at [email or web address], password [facility] 10/09/2014, 1:27 PM  LOS: 2 days

## 2014-10-09 NOTE — Progress Notes (Addendum)
ANTIBIOTIC CONSULT NOTE - FOLLOW UP  Pharmacy Consult for vancomycin + levaquin Indication: bacteremia  Allergies  Allergen Reactions  . Ambien [Zolpidem Tartrate] Other (See Comments)    unknown  . Ampicillin-Sulbactam Sodium Other (See Comments)    Muscles draw up tight - severe headache  . Oxycontin [Oxycodone Hcl] Other (See Comments)    dizziness  . Tape Other (See Comments)    Paper tape only.  . Toradol [Ketorolac Tromethamine] Nausea Only  . Tramadol Nausea Only  . Latex Other (See Comments)    Makes skin raw  . Percocet [Oxycodone-Acetaminophen] Itching and Rash    Vicodin is OK    Patient Measurements: Height: '5\' 5"'$  (165.1 cm) Weight: 187 lb 13.3 oz (85.2 kg) IBW/kg (Calculated) : 57  Vital Signs: Temp: 97.8 F (36.6 C) (05/15 0942) Temp Source: Oral (05/15 0942) BP: 147/86 mmHg (05/15 0942) Pulse Rate: 111 (05/15 0942) Intake/Output from previous day: 05/14 0701 - 05/15 0700 In: 240 [P.O.:240] Out: 1 [Emesis/NG output:1] Intake/Output from this shift:    Labs:  Recent Labs  10/07/14 1800 10/08/14 0554 10/08/14 1001 10/09/14 0703  WBC 2.2* 3.0*  --  2.8*  HGB 13.9 14.6  --  10.7*  PLT 72* 101*  --  62*  CREATININE 1.24*  --  1.07* 0.92   Estimated Creatinine Clearance: 58.7 mL/min (by C-G formula based on Cr of 0.92). No results for input(s): VANCOTROUGH, VANCOPEAK, VANCORANDOM, GENTTROUGH, GENTPEAK, GENTRANDOM, TOBRATROUGH, TOBRAPEAK, TOBRARND, AMIKACINPEAK, AMIKACINTROU, AMIKACIN in the last 72 hours.   Microbiology: Recent Results (from the past 720 hour(s))  Culture, blood (routine x 2)     Status: None   Collection Time: 10/05/14  7:45 PM  Result Value Ref Range Status   Specimen Description BLOOD RIGHT HAND  Final   Special Requests BOTTLES DRAWN AEROBIC AND ANAEROBIC 5ML  Final   Culture   Final    STAPHYLOCOCCUS SPECIES (COAGULASE NEGATIVE) Note: THE SIGNIFICANCE OF ISOLATING THIS ORGANISM FROM A SINGLE SET OF BLOOD CULTURES WHEN  MULTIPLE SETS ARE DRAWN IS UNCERTAIN. PLEASE NOTIFY THE MICROBIOLOGY DEPARTMENT WITHIN ONE WEEK IF SPECIATION AND SENSITIVITIES ARE REQUIRED. Note: Gram Stain Report Called to,Read Back By and Verified With: [facility] HUSAIN 10/07/14 AT 63 BY PEAKY Performed at [facility]    Report Status 10/08/2014 FINAL  Final  Culture, blood (routine x 2)     Status: None (Preliminary result)   Collection Time: 10/05/14  9:43 PM  Result Value Ref Range Status   Specimen Description BLOOD RIGHT HAND  Final   Special Requests BOTTLES DRAWN AEROBIC AND ANAEROBIC 5CC EA  Final   Culture   Final           BLOOD CULTURE RECEIVED NO GROWTH TO DATE CULTURE WILL BE HELD FOR 5 DAYS BEFORE ISSUING A FINAL NEGATIVE REPORT Performed at [facility]    Report Status PENDING  Incomplete  Culture, blood (routine x 2)     Status: None (Preliminary result)   Collection Time: 10/07/14  6:00 PM  Result Value Ref Range Status   Specimen Description BLOOD RIGHT HAND  Final   Special Requests BOTTLES DRAWN AEROBIC AND ANAEROBIC 5CC  Final   Culture   Final           BLOOD CULTURE RECEIVED NO GROWTH TO DATE CULTURE WILL BE HELD FOR 5 DAYS BEFORE ISSUING A FINAL NEGATIVE REPORT Performed at [facility]    Report Status PENDING  Incomplete  Culture, blood (routine x  2)     Status: None (Preliminary result)   Collection Time: 10/07/14  6:05 PM  Result Value Ref Range Status   Specimen Description BLOOD RIGHT ANTECUBITAL  Final   Special Requests BOTTLES DRAWN AEROBIC AND ANAEROBIC 5CC  Final   Culture   Final           BLOOD CULTURE RECEIVED NO GROWTH TO DATE CULTURE WILL BE HELD FOR 5 DAYS BEFORE ISSUING A FINAL NEGATIVE REPORT Performed at [facility]    Report Status PENDING  Incomplete  Urine culture     Status: None   Collection Time: 10/07/14  6:10 PM  Result Value Ref Range Status   Specimen Description URINE, CATHETERIZED  Final   Special Requests NONE  Final   Colony  Count NO GROWTH Performed at [facility]   Final   Culture NO GROWTH Performed at [facility]   Final   Report Status 10/08/2014 FINAL  Final    Anti-infectives    Start     Dose/Rate Route Frequency Ordered Stop   10/08/14 2000  vancomycin (VANCOCIN) IVPB 1000 mg/200 mL premix     1,000 mg 200 mL/hr over 60 Minutes Intravenous Every 24 hours 10/07/14 2050     10/07/14 2200  doxycycline (VIBRA-TABS) tablet 100 mg     100 mg Oral Every 12 hours 10/07/14 2021     10/07/14 2100  levofloxacin (LEVAQUIN) IVPB 750 mg     750 mg 100 mL/hr over 90 Minutes Intravenous Every 48 hours 10/07/14 2053     10/07/14 1815  vancomycin (VANCOCIN) 2,000 mg in sodium chloride 0.9 % 500 mL IVPB     2,000 mg 250 mL/hr over 120 Minutes Intravenous  Once 10/07/14 1805 10/07/14 2053      Assessment: 74 yo f recently discharged and called to come back for positive blood cultures.  Pharmacy is consulted to dose vancomycin and levaquin. Weight on admission likely inaccurate (77 kg) since patient's weight has been 84 kg and 85 kg noted the last two days.  Will change abx doses to reflect weight and renal function. Wbc 2.8, tmax 99.3, SCr normalizing at 0.92, CrCl ~58 ml/min.   Vanc 5/13 >> Levaquin 5/13 >>  Goal of Therapy:  Vancomycin trough level 15-20 mcg/ml  Plan:  Change vancomycin to 750 mg IV q12h Change Levaquin to 500 mg IV q24h Monitor cultures, weight, renal function, clinical course  Getsemani Lindon L. Nicole Kindred, PharmD Clinical Pharmacy Resident Pager: [phone] 10/09/2014 11:45 AM  Antibiotics discontinued per MD.  Rubin Payor L. Nicole Kindred, PharmD Clinical Pharmacy Resident Pager: [phone] 10/09/2014 1:35 PM

## 2014-10-09 NOTE — Evaluation (Signed)
Occupational Therapy Evaluation Patient Details Name: [name] MRN: [id number] DOB: Jun 26, 1940 Today's Date: 10/09/2014    History of Present Illness 74 y.o. female with h/o COPD, CVA, incontinence admitted with a fall and fever. Pt has C2, T2, T3 compression fxs treated with cervical thoracic brace. Discharged on 10/06/14 and returned the next day with fever. Being checked for bacteremia.   Clinical Impression   PTA pt lived at home and reports that she was managing well but required assist for her brace from daughter. Does report multiple falls at home. Pt requires mod I for ADLs with assist for brace management and has no further acute OT needs.     Follow Up Recommendations  No OT follow up    Equipment Recommendations  None recommended by OT    Recommendations for Other Services       Precautions / Restrictions Precautions Precautions: Fall Precaution Comments: has had 3-4 falls since October, 2 were passing out, 1 she tripped, 1 due to dehydration per daughter Required Braces or Orthoses: Cervical Brace Cervical Brace: Hard collar;At all times Restrictions Weight Bearing Restrictions: No      Mobility Bed Mobility Overal bed mobility: Modified Independent             General bed mobility comments: good technique with log roll  Transfers Overall transfer level: Modified independent Equipment used: None                       ADL Overall ADL's : Modified independent                                       General ADL Comments: Pt mod I except for brace management, which daughter is able to assist with. Pt was able to reach Bil socks sitting EOB.      Vision Additional Comments: No change from baseline          Pertinent Vitals/Pain Pain Assessment: 0-10 Pain Score: 2  Pain Location: r shoulder, scapula Pain Descriptors / Indicators: Aching Pain Intervention(s): Limited activity within patient's tolerance;Monitored  during session;Repositioned;Ice applied     Hand Dominance     Extremity/Trunk Assessment Upper Extremity Assessment Upper Extremity Assessment: Overall WFL for tasks assessed   Lower Extremity Assessment Lower Extremity Assessment: Overall WFL for tasks assessed   Cervical / Trunk Assessment Cervical / Trunk Assessment: Normal   Communication Communication Communication: No difficulties   Cognition Arousal/Alertness: Awake/alert Behavior During Therapy: WFL for tasks assessed/performed Overall Cognitive Status: Within Functional Limits for tasks assessed                                Home Living Family/patient expects to be discharged to:: Private residence Living Arrangements: Children Available Help at Discharge: Family;Available 24 hours/day Type of Home: House Home Access: Stairs to enter [employer] of Steps: 1   Home Layout: One level     Bathroom Shower/Tub: Tub/shower unit Shower/tub characteristics: Curtain Biochemist, clinical: Standard     Home Equipment: Environmental consultant - 2 wheels;Cane - single point;Crutches;Bedside commode;Grab bars - tub/shower          Prior Functioning/Environment Level of Independence: Independent        Comments: reports she manages well but daughter does assist due to brace    OT Diagnosis: Generalized weakness;Acute pain  End of Session Equipment Utilized During Treatment: Cervical collar  Activity Tolerance: Patient tolerated treatment well Patient left: in bed;with call bell/phone within reach;with bed alarm set   Time: 3888-2800 OT Time Calculation (min): 23 min Charges:  OT General Charges $OT Visit: 1 Procedure OT Evaluation $Initial OT Evaluation Tier I: 1 Procedure OT Treatments $Self Care/Home Management : 8-22 mins G-Codes:    Villa Herb M 10-29-14, 4:59 PM  Cyndie Chime, OTR/L Occupational Therapist [phone] (pager)

## 2014-10-10 LAB — CBC WITH DIFFERENTIAL/PLATELET
Basophils Absolute: 0.1 10*3/uL (ref 0.0–0.1)
Basophils Relative: 3 % — ABNORMAL HIGH (ref 0–1)
Eosinophils Absolute: 0.1 10*3/uL (ref 0.0–0.7)
Eosinophils Relative: 2 % (ref 0–5)
HCT: 43.2 % (ref 36.0–46.0)
Hemoglobin: 14.4 g/dL (ref 12.0–15.0)
Lymphocytes Relative: 65 % — ABNORMAL HIGH (ref 12–46)
Lymphs Abs: 3.1 10*3/uL (ref 0.7–4.0)
MCH: 29.4 pg (ref 26.0–34.0)
MCHC: 33.3 g/dL (ref 30.0–36.0)
MCV: 88.3 fL (ref 78.0–100.0)
Monocytes Absolute: 0.6 10*3/uL (ref 0.1–1.0)
Monocytes Relative: 12 % (ref 3–12)
Neutro Abs: 0.8 10*3/uL — ABNORMAL LOW (ref 1.7–7.7)
Neutrophils Relative %: 18 % — ABNORMAL LOW (ref 43–77)
Platelets: 86 10*3/uL — ABNORMAL LOW (ref 150–400)
RBC: 4.89 MIL/uL (ref 3.87–5.11)
RDW: 14.6 % (ref 11.5–15.5)
WBC: 4.7 10*3/uL (ref 4.0–10.5)

## 2014-10-10 LAB — PATHOLOGIST SMEAR REVIEW

## 2014-10-10 LAB — BASIC METABOLIC PANEL
Anion gap: 7 (ref 5–15)
BUN: 14 mg/dL (ref 6–20)
CO2: 26 mmol/L (ref 22–32)
Calcium: 9.6 mg/dL (ref 8.9–10.3)
Chloride: 106 mmol/L (ref 101–111)
Creatinine, Ser: 1.07 mg/dL — ABNORMAL HIGH (ref 0.44–1.00)
GFR calc Af Amer: 58 mL/min — ABNORMAL LOW (ref >60)
GFR calc non Af Amer: 50 mL/min — ABNORMAL LOW (ref >60)
Glucose, Bld: 96 mg/dL (ref 65–99)
Potassium: 4 mmol/L (ref 3.5–5.1)
Sodium: 139 mmol/L (ref 135–145)

## 2014-10-10 LAB — B. BURGDORFI ANTIBODIES: B burgdorferi Ab IgG+IgM: <0.91 {ISR} (ref 0.00–0.90)

## 2014-10-10 MED ORDER — HEPARIN SODIUM (PORCINE) 5000 UNIT/ML IJ SOLN: 5000.0000 [IU] | Freq: Three times a day (TID) | INTRAMUSCULAR | Status: DC

## 2014-10-10 MED ORDER — DOXYCYCLINE HYCLATE 100 MG PO CAPS: 100.0000 mg | ORAL_CAPSULE | Freq: Two times a day (BID) | ORAL | Status: DC

## 2014-10-10 NOTE — Progress Notes (Signed)
Physical Therapy Treatment Patient Details Name: [name] MRN: [id number] DOB: 1940/07/06 Today's Date: 10/10/2014    History of Present Illness 74 y.o. female with h/o COPD, CVA, incontinence admitted with a fall and fever. Pt has C2, T2, T3 compression fxs treated with cervical thoracic brace. Discharged on 10/06/14 and returned the next day with fever. Being checked for bacteremia.    PT Comments    Noting good progress, and plans to dc home today; Pt states she is not likley to use RW in the home as she states it won't fit; At this point I continue to favor use of RW for bil UE support with walking; Will defer to HHPT to see how pt manages in the home;   OK for dc home from PT standpoint   Follow Up Recommendations  Home health PT     Equipment Recommendations  None recommended by PT    Recommendations for Other Services       Precautions / Restrictions Precautions Precautions: Fall Precaution Comments: has had 3-4 falls since October, 2 were passing out, 1 she tripped, 1 due to dehydration per daughter Required Braces or Orthoses: Cervical Brace Cervical Brace: Hard collar;At all times Restrictions Weight Bearing Restrictions: No    Mobility  Bed Mobility               General bed mobility comments: good technique with log roll  Transfers Overall transfer level: Needs assistance Equipment used: Rolling walker (2 wheeled) Transfers: Sit to/from Stand Sit to Stand: Supervision         General transfer comment: Cues for hand palcement and prec  Ambulation/Gait Ambulation/Gait assistance: Supervision Ambulation Distance (Feet): 150 Feet Assistive device: Rolling walker (2 wheeled) Gait Pattern/deviations: Step-through pattern Gait velocity: WFL   General Gait Details: vc's for safety with RW and vc's for steering RW in straight line   Stairs Stairs: Yes Stairs assistance: Min guard Stair Management: One rail Left;Alternating  pattern;Forwards Number of Stairs: 4 General stair comments: Close guard for safety, but managing well  Wheelchair Mobility    Modified Rankin (Stroke Patients Only)       Balance                                    Cognition Arousal/Alertness: Awake/alert Behavior During Therapy: WFL for tasks assessed/performed Overall Cognitive Status: Within Functional Limits for tasks assessed                      Exercises      General Comments        Pertinent Vitals/Pain Pain Assessment: No/denies pain    Home Living                      Prior Function            PT Goals (current goals can now be found in the care plan section) Acute Rehab PT Goals Patient Stated Goal: home to my dogs PT Goal Formulation: With patient/family Time For Goal Achievement: 10/22/14 Potential to Achieve Goals: Good Progress towards PT goals: Progressing toward goals    Frequency  Min 3X/week    PT Plan Current plan remains appropriate    Co-evaluation             End of Session   Activity Tolerance: Patient tolerated treatment well Patient left: in chair;with call bell/phone within  reach     Time: 7116-5790 PT Time Calculation (min) (ACUTE ONLY): 15 min  Charges:  $Gait Training: 8-22 mins                    G Codes:      Quin Hoop 10/10/2014, 12:26 PM  Roney Marion, [place] Pager [phone] Office [phone]

## 2014-10-10 NOTE — Progress Notes (Signed)
While giving her pain medicine, pt handed her ring to RN as a "remembrance". RN handed back the ring to pt but pt got upset and stated "take that as my token. I will feel bad if you don't." Charge RN made aware, ring was placed in a bag with pt's label on it and placed back together with her belongings in the room.

## 2014-10-10 NOTE — Discharge Summary (Signed)
Physician Discharge Summary  [name] ATF:[id number] DOB: 12-May-1941 DOA: 10/07/2014  PCP: Georgann Housekeeper, MD  Admit date: 10/07/2014 Discharge date: 10/10/2014  Time spent: 40 minutes  Recommendations for Outpatient Follow-up:  1. Follow-up with primary care physician within one week. 2. Follow-up with neurosurgery, Dr. Wynetta [name] in 1 week for the cervical compression fractures. 3. Check CBC in 1 week.  Discharge Diagnoses:  Active Problems:   Fever   Bacteremia   Leukopenia   Thrombocytopenia   Sepsis   Discharge Condition: Stable  Diet recommendation: Heart healthy  Filed Weights   10/07/14 2125 10/08/14 2107 10/09/14 2004  Weight: 84.641 kg (186 lb 9.6 oz) 85.2 kg (187 lb 13.3 oz) 85.049 kg (187 lb 8 oz)    History of present illness:  74 year old Caucasian female with past medical history of COPD, hypertension and previous tobacco abuse, discharge from the hospital on 10/06/2014 after she was been admitted for full and having pain in her neck and back. CT of the spine showed C7, T2 and T3 compression fractures then, neurosurgery recommended cervicothoracic brace with outpatient follow-up in 1 week. Patient did have fever of 101.4 back then it was question about recent tick bite from a dog of hers. Patient was discharged home and brought back right away because of fever of 103.7, patient is still denying any cough, burning while passing urine, any confusion or evidence of skin infection.  Hospital Course:   Sepsis Presented with temperature of 103.7 Respiratory rate of 23 and suspicion of infection. Blood culture showed 1/2 bottles positive for Coagulase negative Staph Patient started on broad-spectrum antibiotics, Zosyn and vancomycin. Vancomycin and Zosyn was discontinued, patient continued on doxycycline. CXR negative for acute events, MR of C and T-spine showed no evidence of infection. 2-D echo pending. I think the coagulase negative staph is contaminant not a true  infection. Patient discharged on doxycycline for 7 more days to cover if she does have any tickborne illness.  Recent fall with C7, T2 and T3 compression fractures Patient isn't complaining about some pain, control pain with narcotics. Continue cervical thoracic brace and follow-up with Dr. Wynetta [name] within one week  Leukopenia/thrombocytopenia Presented with leukopenia at 2.2, with mild neutropenia, ANC count is 1.3. Platelets also were down with a nadir of 62. There was one reading of hemoglobin dropped to 10.7, I think it's lab at her because it between 2 reading of 14.6. WBC improved to 4.7 on day of discharge, platelets improved to 86 from 62. Patient needs follow-up CBC in 1 week as outpatient.  ? Tick bite Patient is on doxycycline 100 mg by mouth twice a day, Ehrlichia and Lyme disease titers pending. Continue doxycycline for 7 more days.  Hyponatremia Presented with sodium of 133 improved after IVF hydration  Hypokalemia Mild hypokalemia, potassium 3.3, repleted with oral supplements. Potassium is 4 on discharge day.  HTN Continue home medications.   Procedures:  None  Consultations:  None  Discharge Exam: Filed Vitals:   10/10/14 1008  BP: 128/76  Pulse: 73  Temp: 97.7 F (36.5 C)  Resp: 18   General: Alert and awake, oriented x3, not in any acute distress. HEENT: anicteric sclera, pupils reactive to light and accommodation, EOMI CVS: S1-S2 clear, no murmur rubs or gallops Chest: clear to auscultation bilaterally, no wheezing, rales or rhonchi Abdomen: soft nontender, nondistended, normal bowel sounds, no organomegaly Extremities: no cyanosis, clubbing or edema noted bilaterally Neuro: Cranial nerves II-XII intact, no focal neurological deficits  Discharge Instructions  Discharge Instructions    Diet - low sodium heart healthy    Complete by:  As directed      Increase activity slowly    Complete by:  As directed           Current Discharge  Medication List    CONTINUE these medications which have CHANGED   Details  doxycycline (VIBRAMYCIN) 100 MG capsule Take 1 capsule (100 mg total) by mouth 2 (two) times daily. Qty: 14 capsule, Refills: 0      CONTINUE these medications which have NOT CHANGED   Details  acetaminophen (TYLENOL) 500 MG tablet Take 500 mg by mouth every 6 (six) hours as needed for fever.    albuterol (PROVENTIL) (2.5 MG/3ML) 0.083% nebulizer solution Take 2.5 mg by nebulization every 6 (six) hours as needed for wheezing or shortness of breath.    amLODipine (NORVASC) 5 MG tablet Take 2.5 mg by mouth 2 (two) times daily.     citalopram (CELEXA) 40 MG tablet Take 20 mg by mouth daily. Patient take a half of tab daily    diphenhydrAMINE (BENADRYL) 25 MG tablet Take 25 mg by mouth every 6 (six) hours as needed for itching.    esomeprazole (NEXIUM) 40 MG capsule Take 40 mg by mouth 2 (two) times daily.    gabapentin (NEURONTIN) 300 MG capsule Take 300 mg by mouth 3 (three) times daily.    lovastatin (MEVACOR) 10 MG tablet Take 10 mg by mouth every morning.     meloxicam (MOBIC) 7.5 MG tablet Take 7.5 mg by mouth daily.    polyethylene glycol powder (GLYCOLAX/MIRALAX) powder Take 17 g by mouth daily. For constipation    promethazine (PHENERGAN) 12.5 MG suppository Place 12.5 mg rectally every 6 (six) hours as needed for nausea or vomiting.    senna (SENOKOT) 8.6 MG tablet Take 1-2 tablets by mouth daily.     Soft Lens Products (RA REWETTING DROPS) SOLN 1 application by Does not apply route daily.    tiZANidine (ZANAFLEX) 4 MG capsule Take 2 mg by mouth at bedtime.     vitamin B-12 (CYANOCOBALAMIN) 1000 MCG tablet Take 1,000 mcg by mouth daily.    albuterol (PROVENTIL HFA;VENTOLIN HFA) 108 (90 BASE) MCG/ACT inhaler Inhale 1 puff into the lungs every 6 (six) hours as needed for wheezing or shortness of breath.    ALPRAZolam (XANAX) 1 MG tablet Take 1 mg by mouth at bedtime.    dicyclomine (BENTYL) 20  MG tablet Take 20 mg by mouth daily as needed for spasms.     HYDROcodone-acetaminophen (VICODIN) 5-500 MG per tablet Take 1 tablet by mouth every 6 (six) hours as needed. For pain    meclizine (ANTIVERT) 12.5 MG tablet Take 12.5 mg by mouth 3 (three) times daily as needed for nausea.    traZODone (DESYREL) 50 MG tablet Take 0.5 tablets (25 mg total) by mouth at bedtime. Qty: 30 tablet, Refills: 3       Allergies  Allergen Reactions  . Ambien [Zolpidem Tartrate] Other (See Comments)    unknown  . Ampicillin-Sulbactam Sodium Other (See Comments)    Muscles draw up tight - severe headache  . Oxycontin [Oxycodone Hcl] Other (See Comments)    dizziness  . Tape Other (See Comments)    Paper tape only.  . Toradol [Ketorolac Tromethamine] Nausea Only  . Tramadol Nausea Only  . Latex Other (See Comments)    Makes skin raw  . Percocet [Oxycodone-Acetaminophen] Itching and Rash  Vicodin is OK   Follow-up Information    Follow up with Georgann Housekeeper, MD In 1 week.   Specialty:  Internal Medicine   Contact information:   [address] [place] [address] [phone]        The results of significant diagnostics from this hospitalization (including imaging, microbiology, ancillary and laboratory) are listed below for reference.    Significant Diagnostic Studies: Dg Chest 2 View  10/07/2014   CLINICAL DATA:  Fever.  EXAM: CHEST  2 VIEW  COMPARISON:  Oct 05, 2014.  FINDINGS: Stable cardiomediastinal silhouette. Both lungs are clear. No pneumothorax or pleural effusion is noted. The visualized skeletal structures are unremarkable.  IMPRESSION: No active cardiopulmonary disease.   Electronically Signed   By: Lupita Raider, M.D.   On: 10/07/2014 18:37   Ct Head Wo Contrast  10/05/2014   CLINICAL DATA:  Fall.  EXAM: CT HEAD WITHOUT CONTRAST  CT CERVICAL SPINE WITHOUT CONTRAST  TECHNIQUE: Multidetector CT imaging of the head and cervical spine was performed following  the standard protocol without intravenous contrast. Multiplanar CT image reconstructions of the cervical spine were also generated.  COMPARISON:  CT head 06/26/2011  FINDINGS: CT HEAD FINDINGS  Generalized atrophy with mild progression. Mild chronic microvascular ischemic change in the white matter. 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  Negative for acute infarct.  Negative for hemorrhage or mass.  Left globe retinal hemorrhage unchanged. Bilateral staphyloma of the globe unchanged.  Negative for skull fracture.  CT CERVICAL SPINE FINDINGS  Mild compression fracture of C7 appears acute. There is depression of the superior and inferior endplates with a fracture line in the anterior vertebral body best seen on sagittal images. No other fracture  Normal alignment. Mild degenerative changes in the cervical spine. Left-sided facet degeneration throughout the cervical spine. No significant spinal stenosis.  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 This is not causing significant spinal stenosis.  Critical Value/emergent results were called by telephone at the time of interpretation on 10/05/2014 at 10:48 am to Dr. Tilden Fossa , who verbally acknowledged these results.   Electronically Signed   By: Marlan Palau M.D.   On: 10/05/2014 10:48   Ct Cervical Spine Wo Contrast  10/05/2014   CLINICAL DATA:  Fall.  EXAM: CT HEAD WITHOUT CONTRAST  CT CERVICAL SPINE WITHOUT CONTRAST  TECHNIQUE: Multidetector CT imaging of the head and cervical spine was performed following the standard protocol without intravenous contrast. Multiplanar CT image reconstructions of the cervical spine were also generated.  COMPARISON:  CT head 06/26/2011  FINDINGS: CT HEAD FINDINGS  Generalized atrophy with mild progression. Mild chronic microvascular ischemic change in the white matter. Chronic lacunar infarction in  the left putamen.  Negative for acute infarct.  Negative for hemorrhage or mass.  Left globe retinal hemorrhage unchanged. Bilateral staphyloma of the globe unchanged.  Negative for skull fracture.  CT CERVICAL SPINE FINDINGS  Mild compression fracture of C7 appears acute. There is depression of the superior and inferior endplates with a fracture line in the anterior vertebral body best seen on sagittal images. No other fracture  Normal alignment. Mild degenerative changes in the cervical spine. Left-sided facet degeneration throughout the cervical spine. No significant spinal stenosis.  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 This  is not causing significant spinal stenosis.  Critical Value/emergent results were called by telephone at the time of interpretation on 10/05/2014 at 10:48 am to Dr. Tilden Fossa , who verbally acknowledged these results.   Electronically Signed   By: Marlan Palau M.D.   On: 10/05/2014 10:48   Ct Thoracic Spine Wo Contrast  10/05/2014   CLINICAL DATA:  Fall.  C7 fracture  EXAM: CT THORACIC SPINE WITHOUT CONTRAST  TECHNIQUE: Multidetector CT imaging of the thoracic spine was performed without intravenous contrast administration. Multiplanar CT image reconstructions were also generated.  COMPARISON:  CT cervical spine 10/05/2014  FINDINGS: Compression fracture C7 again identified and appears acute. See separate CT cervical spine report  Mild loss of height T2 and T3 vertebral bodies consistent with mild fracture, possibly acute. No other fracture. No evidence of metastatic disease.  Mild thoracic disc degeneration throughout the cervical thoracic spine. No significant spinal stenosis  Bibasilar atelectasis.  IMPRESSION: Acute fracture C7.  See separate cervical spine CT report  Mild compression fractures T2 and T3 of indeterminate age but possibly acute. MRI may be  helpful to date these fractures.  Mild bibasilar atelectasis.   Electronically Signed   By: Marlan Palau M.D.   On: 10/05/2014 13:05   Mr Cervical Spine Wo Contrast  10/08/2014   CLINICAL DATA:  Known cervical spine fracture, new onset fever. Here for assessment of sepsis.  EXAM: MRI CERVICAL AND LUMBAR SPINE WITHOUT CONTRAST  TECHNIQUE: Multiplanar and multiecho pulse sequences of the cervical spine, to include the craniocervical junction and cervicothoracic junction, and thoracic spine, were obtained without intravenous contrast. Please note, motion degraded examination. Per technologist note, patient did not wish to receive intravenous contrast due to pain, examination is noncontrast.  COMPARISON:  CT of the cervical spine Oct 05, 2014  FINDINGS: MRI CERVICAL SPINE FINDINGS  Motion degraded examination.  Moderate C7 compression fracture, low T1 and bright T2 signal with 30-50% height loss. No retropulsed bony fragments. Remaining cervical vertebral bodies appear intact. Maintenance of cervical lordosis. Intervertebral discs demonstrate normal morphology, decreased T2 signal within all cervical disc most consistent with desiccation.  Low signal measuring up to 6 mm in AP dimension posterior to the odontoid process consistent with pannus, can be seen with CPPD without erosions or edema. Cervical spinal cord appears normal morphology and signal characteristics though, motion degrades sensitivity for subtle cord signal abnormality. No syrinx. Included prevertebral and paraspinal soft tissues are normal.  Level by level evaluation:  C2-3: Moderate facet arthropathy. No disc bulge, canal stenosis or neural foraminal narrowing.  C3-4: Annular bulging, uncovertebral hypertrophy. Moderate LEFT greater than RIGHT facet arthropathy. Very mild canal stenosis. Mild LEFT neural foraminal narrowing.  C4-5: Small broad-based disc bulge, mild to moderate facet arthropathy and uncovertebral hypertrophy. Moderate canal  stenosis. Moderate to severe LEFT greater than RIGHT neural foraminal narrowing.  C5-6: Small broad-based disc bulge, uncovertebral hypertrophy and moderate facet arthropathy. Mild canal stenosis. Moderate RIGHT, moderate to severe LEFT neural foraminal narrowing.  C6-7: Severely limited by motion and habitus. No definite disc bulge. At least moderate facet arthropathy without canal stenosis or neural foraminal narrowing.  C7-T1: No definite disc bulge, canal stenosis. At least mild facet arthropathy without neural foraminal narrowing.  MRI THORACIC SPINE FINDINGS  Moderate acute appearing T2 compression fracture with 30-50% height loss. Mild acute appearing T3 compression fracture. The remaining thoracic vertebral bodies appear intact. Maintenance of thoracic kyphosis. Intervertebral discs demonstrate relatively preserved morphology and signal characteristics. Minimal subacute to  chronic discogenic endplate change of lower thoracic spine. 13 mm T6 hemangioma. Scattered chronic Schmorl's nodes.  Thoracic spinal cord appears normal in morphology and signal characteristics of the level the conus medullaris which terminates at T12-L1. Motion decreases sensitivity for potential cord signal abnormality.  No significant disc bulge, canal stenosis or neural foraminal narrowing any thoracic level.  IMPRESSION: Please note, examination was ordered with contrast though, after noncontrast portion, patient was unable to tolerate imaging due to pain and contrast-enhanced sequences not performed.  MRI CERVICAL SPINE: Acute moderate C7 compression fracture. No malalignment.  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 Multilevel neural foraminal narrowing: Moderate to severe on the LEFT at C4-5 and C5-6.  MRI THORACIC SPINE: Acute moderate T2 compression fracture. Mild acute T3 compression fracture. No malalignment.   Electronically Signed   By: Awilda Metro   On: 10/08/2014 04:30    Mr Thoracic Spine Wo Contrast  10/08/2014   CLINICAL DATA:  Known cervical spine fracture, new onset fever. Here for assessment of sepsis.  EXAM: MRI CERVICAL AND LUMBAR SPINE WITHOUT CONTRAST  TECHNIQUE: Multiplanar and multiecho pulse sequences of the cervical spine, to include the craniocervical junction and cervicothoracic junction, and thoracic spine, were obtained without intravenous contrast. Please note, motion degraded examination. Per technologist note, patient did not wish to receive intravenous contrast due to pain, examination is noncontrast.  COMPARISON:  CT of the cervical spine Oct 05, 2014  FINDINGS: MRI CERVICAL SPINE FINDINGS  Motion degraded examination.  Moderate C7 compression fracture, low T1 and bright T2 signal with 30-50% height loss. No retropulsed bony fragments. Remaining cervical vertebral bodies appear intact. Maintenance of cervical lordosis. Intervertebral discs demonstrate normal morphology, decreased T2 signal within all cervical disc most consistent with desiccation.  Low signal measuring up to 6 mm in AP dimension posterior to the odontoid process consistent with pannus, can be seen with CPPD without erosions or edema. Cervical spinal cord appears normal morphology and signal characteristics though, motion degrades sensitivity for subtle cord signal abnormality. No syrinx. Included prevertebral and paraspinal soft tissues are normal.  Level by level evaluation:  C2-3: Moderate facet arthropathy. No disc bulge, canal stenosis or neural foraminal narrowing.  C3-4: Annular bulging, uncovertebral hypertrophy. Moderate LEFT greater than RIGHT facet arthropathy. Very mild canal stenosis. Mild LEFT neural foraminal narrowing.  C4-5: Small broad-based disc bulge, mild to moderate facet arthropathy and uncovertebral hypertrophy. Moderate canal stenosis. Moderate to severe LEFT greater than RIGHT neural foraminal narrowing.  C5-6: Small broad-based disc bulge, uncovertebral  hypertrophy and moderate facet arthropathy. Mild canal stenosis. Moderate RIGHT, moderate to severe LEFT neural foraminal narrowing.  C6-7: Severely limited by motion and habitus. No definite disc bulge. At least moderate facet arthropathy without canal stenosis or neural foraminal narrowing.  C7-T1: No definite disc bulge, canal stenosis. At least mild facet arthropathy without neural foraminal narrowing.  MRI THORACIC SPINE FINDINGS  Moderate acute appearing T2 compression fracture with 30-50% height loss. Mild acute appearing T3 compression fracture. The remaining thoracic vertebral bodies appear intact. Maintenance of thoracic kyphosis. Intervertebral discs demonstrate relatively preserved morphology and signal characteristics. Minimal subacute to chronic discogenic endplate change of lower thoracic spine. 13 mm T6 hemangioma. Scattered chronic Schmorl's nodes.  Thoracic spinal cord appears normal in morphology and signal characteristics of the level the conus medullaris which terminates at T12-L1. Motion decreases sensitivity for potential cord signal abnormality.  No significant disc bulge, canal stenosis or neural foraminal narrowing any thoracic  level.  IMPRESSION: Please note, examination was ordered with contrast though, after noncontrast portion, patient was unable to tolerate imaging due to pain and contrast-enhanced sequences not performed.  MRI CERVICAL SPINE: Acute moderate C7 compression fracture. No malalignment.  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 Multilevel neural foraminal narrowing: Moderate to severe on the LEFT at C4-5 and C5-6.  MRI THORACIC SPINE: Acute moderate T2 compression fracture. Mild acute T3 compression fracture. No malalignment.   Electronically Signed   By: Awilda Metro   On: 10/08/2014 04:30   US Abdomen Complete  10/05/2014   CLINICAL DATA:  74 year old female with abdominal pain (not otherwise specified at the time of  this report). Initial encounter.  EXAM: ULTRASOUND ABDOMEN COMPLETE  COMPARISON:  Lumbar spine MRI 03/28/2014. CT Abdomen and Pelvis 10/17/2012.  FINDINGS: Gallbladder: Surgically absent  Common bile duct: Diameter: 11 mm (stable to decreased since 2014)  Liver: Echogenicity within normal limits. Mild intrahepatic biliary ductal dilatation appears stable since 2014. No discrete liver lesion.  IVC: No abnormality visualized.  Pancreas: Visualized portion unremarkable.  Spleen: Size and appearance within normal limits.  Right Kidney: Length: 10.0 cm. Echogenicity within normal limits. No mass or hydronephrosis visualized.  Left Kidney: Length: 10.0 cm. Echogenicity within normal limits. No mass or hydronephrosis visualized.  Abdominal aorta: No aneurysm visualized.  Other findings: None.  IMPRESSION: No acute findings.  Stable biliary tree status post cholecystectomy.   Electronically Signed   By: Odessa Fleming M.D.   On: 10/05/2014 14:48   Dg Chest Port 1 View  10/05/2014   CLINICAL DATA:  74 year old female status post fall with pain. Initial encounter.  EXAM: PORTABLE CHEST - 1 VIEW  COMPARISON:  06/15/2014 and earlier.  FINDINGS: Portable AP semi upright view at 1002 hrs. Stable mild cardiomegaly. Other mediastinal contours are within normal limits. Small calcified granulomas in both upper lobes again noted. No pneumothorax, pulmonary edema, pleural effusion or acute pulmonary opacity. No acute osseous abnormality identified.  IMPRESSION: No acute cardiopulmonary abnormality or acute traumatic injury identified.   Electronically Signed   By: Odessa Fleming M.D.   On: 10/05/2014 10:17    Microbiology: Recent Results (from the past 240 hour(s))  Culture, blood (routine x 2)     Status: None   Collection Time: 10/05/14  7:45 PM  Result Value Ref Range Status   Specimen Description BLOOD RIGHT HAND  Final   Special Requests BOTTLES DRAWN AEROBIC AND ANAEROBIC  Final   Culture   Final    STAPHYLOCOCCUS SPECIES  (COAGULASE NEGATIVE) Note: THE SIGNIFICANCE OF ISOLATING THIS ORGANISM FROM A SINGLE SET OF BLOOD CULTURES WHEN MULTIPLE SETS ARE DRAWN IS UNCERTAIN. PLEASE NOTIFY THE MICROBIOLOGY DEPARTMENT WITHIN ONE WEEK IF SPECIATION AND SENSITIVITIES ARE REQUIRED. Note: Gram Stain Report Called to,Read Back By and Verified With: [facility] HUSAIN 10/07/14 AT 1410 BY PEAKY Performed at [facility]    Report Status 10/08/2014 FINAL  Final  Culture, blood (routine x 2)     Status: None (Preliminary result)   Collection Time: 10/05/14  9:43 PM  Result Value Ref Range Status   Specimen Description BLOOD RIGHT HAND  Final   Special Requests BOTTLES DRAWN AEROBIC AND ANAEROBIC 5CC EA  Final   Culture   Final           BLOOD CULTURE RECEIVED NO GROWTH TO DATE CULTURE WILL BE HELD FOR 5 DAYS BEFORE ISSUING A FINAL NEGATIVE REPORT Performed at  [facility]    Report Status PENDING  Incomplete  Culture, blood (routine x 2)     Status: None (Preliminary result)   Collection Time: 10/07/14  6:00 PM  Result Value Ref Range Status   Specimen Description BLOOD RIGHT HAND  Final   Special Requests BOTTLES DRAWN AEROBIC AND ANAEROBIC 5CC  Final   Culture   Final           BLOOD CULTURE RECEIVED NO GROWTH TO DATE CULTURE WILL BE HELD FOR 5 DAYS BEFORE ISSUING A FINAL NEGATIVE REPORT Performed at [facility]    Report Status PENDING  Incomplete  Culture, blood (routine x 2)     Status: None (Preliminary result)   Collection Time: 10/07/14  6:05 PM  Result Value Ref Range Status   Specimen Description BLOOD RIGHT ANTECUBITAL  Final   Special Requests BOTTLES DRAWN AEROBIC AND ANAEROBIC 5CC  Final   Culture   Final           BLOOD CULTURE RECEIVED NO GROWTH TO DATE CULTURE WILL BE HELD FOR 5 DAYS BEFORE ISSUING A FINAL NEGATIVE REPORT Performed at [facility]    Report Status PENDING  Incomplete  Urine culture     Status: None   Collection Time: 10/07/14  6:10 PM  Result Value  Ref Range Status   Specimen Description URINE, CATHETERIZED  Final   Special Requests NONE  Final   Colony Count NO GROWTH Performed at [facility]   Final   Culture NO GROWTH Performed at [facility]   Final   Report Status 10/08/2014 FINAL  Final     Labs: Basic Metabolic Panel:  Recent Labs Lab 10/06/14 0600 10/07/14 1800 10/08/14 1001 10/09/14 0703 10/10/14 0403  NA 139 133* 134* 139 139  K 3.3* 3.4* 3.3* 3.9 4.0  CL 107 102 104 108 106  CO2 24 21* 24 22 26   GLUCOSE 118* 106* 107* 94 96  BUN 6 13 11 10 14   CREATININE 0.99 1.24* 1.07* 0.92 1.07*  CALCIUM 8.8* 8.5* 8.4* 9.3 9.6   Liver Function Tests:  Recent Labs Lab 10/05/14 1014 10/07/14 1800 10/08/14 1001  AST 31 77* 78*  ALT 18 44 42  ALKPHOS 97 101 90  BILITOT 0.8 1.1 1.1  PROT 7.4 6.1* 5.7*  ALBUMIN 3.5 3.0* 2.5*    Recent Labs Lab 10/05/14 1014  LIPASE 18*   No results for input(s): AMMONIA in the last 168 hours. CBC:  Recent Labs Lab 10/05/14 1014 10/06/14 0600 10/07/14 1800 10/08/14 0554 10/09/14 0703 10/10/14 0403  WBC 4.3 2.8* 2.2* 3.0* 2.8* 4.7  NEUTROABS 3.1  --  1.5* 1.3* 0.5* 0.8*  HGB 14.5 14.0 13.9 14.6 10.7* 14.4  HCT 44.8 42.5 42.2 42.8 38.4 43.2  MCV 90.3 90.6 88.1 88.8 88.3 88.3  PLT 143* 116* 72* 101* 62* 86*   Cardiac Enzymes:  Recent Labs Lab 10/07/14 2329 10/08/14 1001  TROPONINI <0.03 <0.03   BNP: BNP (last 3 results) No results for input(s): BNP in the last 8760 hours.  ProBNP (last 3 results) No results for input(s): PROBNP in the last 8760 hours.  CBG: No results for input(s): GLUCAP in the last 168 hours.     Signed:  Cap Massi A  [facility] 10/10/2014, 11:43 AM

## 2014-10-10 NOTE — Care Management Note (Signed)
Case Management Note  Patient Details  Name: [name] MRN: [id number] Date of Birth: 19-Feb-1941  Subjective/Objective:                 CM following for progression and d/c planning.   Action/Plan: Met with pt 10/10/14 am, 3:1 ordered per pt request, pt selected [facility], that agency notified, plan to d/c to home today with daughter to assist 24/7.  Expected Discharge Date:         10/10/2014         Expected Discharge Plan:  [place]  In-House Referral:     Discharge planning Services  CM Consult  Post Acute Care Choice:  Durable Medical Equipment Choice offered to:  Patient  DME Arranged:  Bedside commode DME Agency:  [place]:  PT [facility] Agency:  [place]  Status of Service:  Completed, signed off  Medicare Important Message Given:  Yes Date Medicare IM Given:  10/10/14 Medicare IM give by:  Jasmine Pang RN MPH, case manager Date Additional Medicare IM Given:    Additional Medicare Important Message give by:     If discussed at [place] of Stay Meetings, dates discussed:    Additional Comments:  Adron Bene, RN 10/10/2014, 10:34 AM

## 2014-10-12 LAB — EHRLICHIA ANTIBODY PANEL
E chaffeensis (HGE) Ab, IgG: <1:64 {titer}
E chaffeensis (HGE) Ab, IgM: <1:20 {titer}
E. Chaffeensis (HME) IgM Titer: NEGATIVE
E.Chaffeensis (HME) IgG: NEGATIVE

## 2014-10-12 LAB — CULTURE, BLOOD (ROUTINE X 2): Culture: NO GROWTH

## 2014-10-13 DIAGNOSIS — J449 Chronic obstructive pulmonary disease, unspecified: Secondary | ICD-10-CM | POA: Diagnosis not present

## 2014-10-13 DIAGNOSIS — Z720 Tobacco use: Secondary | ICD-10-CM | POA: Diagnosis not present

## 2014-10-13 DIAGNOSIS — S22029D Unspecified fracture of second thoracic vertebra, subsequent encounter for fracture with routine healing: Secondary | ICD-10-CM | POA: Diagnosis not present

## 2014-10-13 DIAGNOSIS — S22039D Unspecified fracture of third thoracic vertebra, subsequent encounter for fracture with routine healing: Secondary | ICD-10-CM | POA: Diagnosis not present

## 2014-10-13 DIAGNOSIS — F0390 Unspecified dementia without behavioral disturbance: Secondary | ICD-10-CM | POA: Diagnosis not present

## 2014-10-13 DIAGNOSIS — A419 Sepsis, unspecified organism: Secondary | ICD-10-CM | POA: Diagnosis not present

## 2014-10-13 DIAGNOSIS — S129XXD Fracture of neck, unspecified, subsequent encounter: Secondary | ICD-10-CM | POA: Diagnosis not present

## 2014-10-13 DIAGNOSIS — F329 Major depressive disorder, single episode, unspecified: Secondary | ICD-10-CM | POA: Diagnosis not present

## 2014-10-13 DIAGNOSIS — M5136 Other intervertebral disc degeneration, lumbar region: Secondary | ICD-10-CM | POA: Diagnosis not present

## 2014-10-13 DIAGNOSIS — I10 Essential (primary) hypertension: Secondary | ICD-10-CM | POA: Diagnosis not present

## 2014-10-14 LAB — CULTURE, BLOOD (ROUTINE X 2)
Culture: NO GROWTH
Culture: NO GROWTH

## 2014-10-17 DIAGNOSIS — M5136 Other intervertebral disc degeneration, lumbar region: Secondary | ICD-10-CM | POA: Diagnosis not present

## 2014-10-17 DIAGNOSIS — F329 Major depressive disorder, single episode, unspecified: Secondary | ICD-10-CM | POA: Diagnosis not present

## 2014-10-17 DIAGNOSIS — S129XXD Fracture of neck, unspecified, subsequent encounter: Secondary | ICD-10-CM | POA: Diagnosis not present

## 2014-10-17 DIAGNOSIS — S22039D Unspecified fracture of third thoracic vertebra, subsequent encounter for fracture with routine healing: Secondary | ICD-10-CM | POA: Diagnosis not present

## 2014-10-17 DIAGNOSIS — F0390 Unspecified dementia without behavioral disturbance: Secondary | ICD-10-CM | POA: Diagnosis not present

## 2014-10-17 DIAGNOSIS — S22029D Unspecified fracture of second thoracic vertebra, subsequent encounter for fracture with routine healing: Secondary | ICD-10-CM | POA: Diagnosis not present

## 2014-10-19 DIAGNOSIS — S22029D Unspecified fracture of second thoracic vertebra, subsequent encounter for fracture with routine healing: Secondary | ICD-10-CM | POA: Diagnosis not present

## 2014-10-19 DIAGNOSIS — S129XXD Fracture of neck, unspecified, subsequent encounter: Secondary | ICD-10-CM | POA: Diagnosis not present

## 2014-10-19 DIAGNOSIS — F329 Major depressive disorder, single episode, unspecified: Secondary | ICD-10-CM | POA: Diagnosis not present

## 2014-10-19 DIAGNOSIS — S22039D Unspecified fracture of third thoracic vertebra, subsequent encounter for fracture with routine healing: Secondary | ICD-10-CM | POA: Diagnosis not present

## 2014-10-19 DIAGNOSIS — F0390 Unspecified dementia without behavioral disturbance: Secondary | ICD-10-CM | POA: Diagnosis not present

## 2014-10-19 DIAGNOSIS — M5136 Other intervertebral disc degeneration, lumbar region: Secondary | ICD-10-CM | POA: Diagnosis not present

## 2014-10-25 ENCOUNTER — Emergency Department (HOSPITAL_COMMUNITY): Payer: Medicare Other

## 2014-10-25 ENCOUNTER — Other Ambulatory Visit: Payer: Self-pay | Admitting: Internal Medicine

## 2014-10-25 ENCOUNTER — Encounter (HOSPITAL_COMMUNITY): Payer: Self-pay

## 2014-10-25 ENCOUNTER — Emergency Department (HOSPITAL_COMMUNITY)
Admission: EM | Admit: 2014-10-25 | Discharge: 2014-10-25 | Disposition: A | Discharge status: Home / Self Care | Payer: Medicare Other | Attending: Emergency Medicine | Admitting: Emergency Medicine

## 2014-10-25 DIAGNOSIS — R55 Syncope and collapse: Secondary | ICD-10-CM | POA: Diagnosis not present

## 2014-10-25 DIAGNOSIS — R42 Dizziness and giddiness: Secondary | ICD-10-CM | POA: Diagnosis not present

## 2014-10-25 DIAGNOSIS — M542 Cervicalgia: Secondary | ICD-10-CM | POA: Diagnosis not present

## 2014-10-25 DIAGNOSIS — Z8781 Personal history of (healed) traumatic fracture: Secondary | ICD-10-CM | POA: Insufficient documentation

## 2014-10-25 DIAGNOSIS — H5442 Blindness, left eye, normal vision right eye: Secondary | ICD-10-CM | POA: Diagnosis not present

## 2014-10-25 DIAGNOSIS — R630 Anorexia: Secondary | ICD-10-CM | POA: Insufficient documentation

## 2014-10-25 DIAGNOSIS — J449 Chronic obstructive pulmonary disease, unspecified: Secondary | ICD-10-CM | POA: Diagnosis not present

## 2014-10-25 DIAGNOSIS — Z8673 Personal history of transient ischemic attack (TIA), and cerebral infarction without residual deficits: Secondary | ICD-10-CM | POA: Insufficient documentation

## 2014-10-25 DIAGNOSIS — S129XXA Fracture of neck, unspecified, initial encounter: Secondary | ICD-10-CM | POA: Diagnosis not present

## 2014-10-25 DIAGNOSIS — R112 Nausea with vomiting, unspecified: Secondary | ICD-10-CM | POA: Diagnosis not present

## 2014-10-25 DIAGNOSIS — G8929 Other chronic pain: Secondary | ICD-10-CM | POA: Diagnosis not present

## 2014-10-25 DIAGNOSIS — Z87891 Personal history of nicotine dependence: Secondary | ICD-10-CM | POA: Diagnosis not present

## 2014-10-25 DIAGNOSIS — Z79899 Other long term (current) drug therapy: Secondary | ICD-10-CM | POA: Insufficient documentation

## 2014-10-25 DIAGNOSIS — R404 Transient alteration of awareness: Secondary | ICD-10-CM | POA: Diagnosis not present

## 2014-10-25 DIAGNOSIS — I10 Essential (primary) hypertension: Secondary | ICD-10-CM | POA: Insufficient documentation

## 2014-10-25 DIAGNOSIS — R1011 Right upper quadrant pain: Secondary | ICD-10-CM | POA: Diagnosis not present

## 2014-10-25 DIAGNOSIS — Z880 Allergy status to penicillin: Secondary | ICD-10-CM | POA: Insufficient documentation

## 2014-10-25 DIAGNOSIS — J45909 Unspecified asthma, uncomplicated: Secondary | ICD-10-CM | POA: Diagnosis not present

## 2014-10-25 DIAGNOSIS — T148 Other injury of unspecified body region: Secondary | ICD-10-CM | POA: Diagnosis not present

## 2014-10-25 DIAGNOSIS — R51 Headache: Secondary | ICD-10-CM | POA: Diagnosis not present

## 2014-10-25 HISTORY — DX: Fracture of neck, unspecified, initial encounter: S12.9XXA

## 2014-10-25 LAB — CBC WITH DIFFERENTIAL/PLATELET
Basophils Absolute: 0.1 10*3/uL (ref 0.0–0.1)
Basophils Relative: 2 % — ABNORMAL HIGH (ref 0–1)
Eosinophils Absolute: 0.2 10*3/uL (ref 0.0–0.7)
Eosinophils Relative: 3 % (ref 0–5)
HCT: 40.6 % (ref 36.0–46.0)
Hemoglobin: 13.4 g/dL (ref 12.0–15.0)
Lymphocytes Relative: 61 % — ABNORMAL HIGH (ref 12–46)
Lymphs Abs: 3.1 10*3/uL (ref 0.7–4.0)
MCH: 29.5 pg (ref 26.0–34.0)
MCHC: 33 g/dL (ref 30.0–36.0)
MCV: 89.2 fL (ref 78.0–100.0)
Monocytes Absolute: 0.7 10*3/uL (ref 0.1–1.0)
Monocytes Relative: 13 % — ABNORMAL HIGH (ref 3–12)
Neutro Abs: 1.1 10*3/uL — ABNORMAL LOW (ref 1.7–7.7)
Neutrophils Relative %: 21 % — ABNORMAL LOW (ref 43–77)
Platelets: 297 10*3/uL (ref 150–400)
RBC: 4.55 MIL/uL (ref 3.87–5.11)
RDW: 14.9 % (ref 11.5–15.5)
WBC: 5.1 10*3/uL (ref 4.0–10.5)

## 2014-10-25 LAB — COMPREHENSIVE METABOLIC PANEL
ALT: 21 U/L (ref 14–54)
AST: 30 U/L (ref 15–41)
Albumin: 3.5 g/dL (ref 3.5–5.0)
Alkaline Phosphatase: 115 U/L (ref 38–126)
Anion gap: 8 (ref 5–15)
BUN: 10 mg/dL (ref 6–20)
CO2: 25 mmol/L (ref 22–32)
Calcium: 9.2 mg/dL (ref 8.9–10.3)
Chloride: 107 mmol/L (ref 101–111)
Creatinine, Ser: 1.26 mg/dL — ABNORMAL HIGH (ref 0.44–1.00)
GFR calc Af Amer: 48 mL/min — ABNORMAL LOW (ref >60)
GFR calc non Af Amer: 41 mL/min — ABNORMAL LOW (ref >60)
Glucose, Bld: 98 mg/dL (ref 65–99)
Potassium: 4.1 mmol/L (ref 3.5–5.1)
Sodium: 140 mmol/L (ref 135–145)
Total Bilirubin: 0.5 mg/dL (ref 0.3–1.2)
Total Protein: 7.4 g/dL (ref 6.5–8.1)

## 2014-10-25 LAB — URINALYSIS, ROUTINE W REFLEX MICROSCOPIC
Glucose, UA: NEGATIVE mg/dL
Hgb urine dipstick: NEGATIVE
Ketones, ur: NEGATIVE mg/dL
Leukocytes, UA: NEGATIVE
Nitrite: NEGATIVE
Protein, ur: NEGATIVE mg/dL
Specific Gravity, Urine: 1.023 (ref 1.005–1.030)
Urobilinogen, UA: 1 mg/dL (ref 0.0–1.0)
pH: 5.5 (ref 5.0–8.0)

## 2014-10-25 LAB — I-STAT TROPONIN, ED: Troponin i, poc: 0 ng/mL (ref 0.00–0.08)

## 2014-10-25 MED ORDER — FENTANYL CITRATE (PF) 100 MCG/2ML IJ SOLN
25.0000 ug | Freq: Once | INTRAMUSCULAR | Status: AC
  Administered 2014-10-25: 25 ug via INTRAVENOUS
  Filled 2014-10-25: qty 2

## 2014-10-25 MED ORDER — SODIUM CHLORIDE 0.9 % IV BOLUS (SEPSIS)
1000.0000 mL | Freq: Once | INTRAVENOUS | Status: AC
  Administered 2014-10-25: 1000 mL via INTRAVENOUS

## 2014-10-25 NOTE — ED Notes (Signed)
Pt walked to the bathroom with this NT assisting as well as family member. Pt was very wobbley and family stated that she has been this way as well as had a syncopal episode at the drs office today. This NT requested that the pt get back into bed and use the bedpan because she was so wobbley and off-balance. Pt st "no i am walking to the bathroom" pt family member stayed by side while pt used restroom. UA collected and held at bedside, pt is now in bed and connected to cardiac monitor, BP and pulse ox.

## 2014-10-25 NOTE — ED Notes (Signed)
Pt reports headache, nausea and vomiting x2 weeks.  Pt was sent from doctors office today for symptoms.  Per EMS, pt doctor believes that symptoms are a result of old cervical fx.  Pt has Aspen collar in place upon arrival.

## 2014-10-25 NOTE — ED Provider Notes (Signed)
CSN: [id number]     Arrival date & time 10/25/14  1622 History   First MD Initiated Contact with Patient 10/25/14 1624     Chief Complaint  Patient presents with  . Headache     (Consider location/radiation/quality/duration/timing/severity/associated sxs/prior Treatment) Patient is a 74 y.o. female presenting with near-syncope. The history is provided by the patient and a relative.  Near Syncope This is a new problem. The current episode started today. The problem occurs intermittently. The problem has been unchanged. Associated symptoms include headaches, nausea, neck pain (s/p c-spine fracture 2 weeks ago) and vomiting. Pertinent negatives include no abdominal pain, anorexia, change in bowel habit, chest pain, chills, coughing, diaphoresis, fatigue, fever, myalgias, numbness, rash, urinary symptoms, vertigo, visual change or weakness. The symptoms are aggravated by standing. She has tried nothing for the symptoms. The treatment provided no relief.    Past Medical History  Diagnosis Date  . COPD (chronic obstructive pulmonary disease)   . Hypertension   . Stroke   . Macular degeneration   . Blind left eye   . Cervical spine fracture    Past Surgical History  Procedure Laterality Date  . Knee surgery    . Cholecystectomy    . Abdominal hysterectomy    . Ostomy    . Eye surgery      cataracts   History reviewed. No pertinent family history. History  Substance Use Topics  . Smoking status: Former Research scientist (life sciences)  . Smokeless tobacco: Not on file  . Alcohol Use: No   OB History    No data available     Review of Systems  Constitutional: Positive for appetite change. Negative for fever, chills, diaphoresis and fatigue.  Eyes: Negative for photophobia and visual disturbance.  Respiratory: Negative for cough, chest tightness and shortness of breath.   Cardiovascular: Positive for near-syncope. Negative for chest pain, palpitations and leg swelling.  Gastrointestinal: Positive for  nausea and vomiting. Negative for abdominal pain, diarrhea, abdominal distention, anorexia and change in bowel habit.  Genitourinary: Negative for dysuria, frequency, flank pain and difficulty urinating.  Musculoskeletal: Positive for neck pain (s/p c-spine fracture 2 weeks ago). Negative for myalgias, back pain and neck stiffness.  Skin: Negative for color change, pallor and rash.  Neurological: Positive for dizziness, light-headedness and headaches. Negative for vertigo, syncope, facial asymmetry, speech difficulty, weakness and numbness.  All other systems reviewed and are negative.     Allergies  Ampicillin; Oxycodone-acetaminophen; Toradol; Cephalexin; Sulfamethoxazole-trimethoprim; and Tramadol  Home Medications   Prior to Admission medications   Medication Sig Start Date End Date Taking? Authorizing Provider  ALPRAZolam Duanne Moron) 1 MG tablet Take 0.5-1 mg by mouth 2 (two) times daily as needed. Take 1 mg at bedtime as needed for sleep. May take 0.5 mg daily as needed for anxiety. 09/08/14  Yes Historical Provider, MD  amLODipine (NORVASC) 5 MG tablet Take 5 mg by mouth daily. 09/22/14  Yes Historical Provider, MD  citalopram (CELEXA) 40 MG tablet Take 20 mg by mouth every morning. 08/26/14  Yes Historical Provider, MD  ADVAIR DISKUS 250-50 MCG/DOSE AEPB Inhale 1 capsule into the lungs daily. 09/22/14   Historical Provider, MD  albuterol (ACCUNEB) 0.63 MG/3ML nebulizer solution Take 1 ampule by nebulization every 6 (six) hours as needed for wheezing.    Historical Provider, MD  DOCUSATE SODIUM PO Take 1 capsule by mouth daily.    Historical Provider, MD  gabapentin (NEURONTIN) 300 MG capsule Take 300 mg by mouth 3 (three) times daily. 09/20/14  Historical Provider, MD  HYDROcodone-acetaminophen (NORCO/VICODIN) 5-325 MG per tablet Take 1-2 tablets by mouth 3 (three) times daily as needed for moderate pain or severe pain.  08/26/14   Historical Provider, MD  lovastatin (MEVACOR) 10 MG tablet Take  10 mg by mouth daily. 09/22/14   Historical Provider, MD  meclizine (ANTIVERT) 25 MG tablet Take 25 mg by mouth 3 (three) times daily as needed for dizziness.  08/26/14   Historical Provider, MD  NEXIUM 40 MG capsule Take 40 mg by mouth 2 (two) times daily before a meal.  08/26/14   Historical Provider, MD  nystatin (MYCOSTATIN) 100000 UNIT/ML suspension Take 5 mLs by mouth as directed. Swish and spit. 08/02/14   Historical Provider, MD  oxyCODONE-acetaminophen (PERCOCET/ROXICET) 5-325 MG per tablet Take 1 tablet by mouth every 6 (six) hours as needed for severe pain. 09/22/14   Merryl Hacker, MD  polyethylene glycol powder (GLYCOLAX/MIRALAX) powder Take 17 g by mouth daily. 07/12/14   Historical Provider, MD  PROMETHEGAN 12.5 MG suppository Place 1 suppository rectally daily as needed for nausea or vomiting.  07/16/14   Historical Provider, MD  sennosides-docusate sodium (SENOKOT-S) 8.6-50 MG tablet Take 1 tablet by mouth daily.    Historical Provider, MD  tiZANidine (ZANAFLEX) 4 MG tablet Take 2 mg by mouth at bedtime. 08/31/14   Historical Provider, MD  traZODone (DESYREL) 50 MG tablet Take 50 mg by mouth at bedtime. 08/26/14   Historical Provider, MD  VENTOLIN HFA 108 (90 BASE) MCG/ACT inhaler Take 2 puffs by mouth every 6 (six) hours as needed. 09/22/14   Historical Provider, MD  vitamin B-12 (CYANOCOBALAMIN) 1000 MCG tablet Take 1,000 mcg by mouth daily.    Historical Provider, MD   BP 145/66 mmHg  Pulse 66  Temp(Src) 98 F (36.7 C) (Oral)  Resp 21  Ht '5\' 5"'$  (1.651 m)  Wt 187 lb (84.823 kg)  BMI 31.12 kg/m2  SpO2 95% Physical Exam  Constitutional: She is oriented to person, place, and time. She appears well-developed and well-nourished. No distress.  HENT:  Head: Normocephalic and atraumatic.  Mouth/Throat: Oropharynx is clear and moist.  Eyes: Conjunctivae and EOM are normal. Pupils are equal, round, and reactive to light.  Neck: Neck supple.  Aspen collar in place s/p c-spine fracture   Cardiovascular: Normal rate, regular rhythm, normal heart sounds and intact distal pulses.  Exam reveals no gallop and no friction rub.   No murmur heard. Pulmonary/Chest: Effort normal and breath sounds normal. No respiratory distress. She has no wheezes. She has no rales.  Abdominal: Soft. Bowel sounds are normal. She exhibits no distension. There is no tenderness. There is no rebound and no guarding.  Musculoskeletal: Normal range of motion. She exhibits no edema or tenderness.  Neurological: She is alert and oriented to person, place, and time. She has normal strength. No cranial nerve deficit or sensory deficit. She exhibits normal muscle tone. Coordination and gait normal. GCS eye subscore is 4. GCS verbal subscore is 5. GCS motor subscore is 6.  Skin: Skin is warm and dry. No rash noted. She is not diaphoretic. No erythema. No pallor.  Nursing note and vitals reviewed.   ED Course  Procedures (including critical care time) Labs Review Labs Reviewed  CBC WITH DIFFERENTIAL/PLATELET - Abnormal; Notable for the following:    Neutrophils Relative % 21 (*)    Neutro Abs 1.1 (*)    Lymphocytes Relative 61 (*)    Monocytes Relative 13 (*)    Basophils  Relative 2 (*)    All other components within normal limits  URINALYSIS, ROUTINE W REFLEX MICROSCOPIC (NOT AT [facility]) - Abnormal; Notable for the following:    Color, Urine AMBER (*)    Bilirubin Urine SMALL (*)    All other components within normal limits  COMPREHENSIVE METABOLIC PANEL - Abnormal; Notable for the following:    Creatinine, Ser 1.26 (*)    GFR calc non Af Amer 41 (*)    GFR calc Af Amer 48 (*)    All other components within normal limits  URINE CULTURE  I-STAT TROPOININ, ED    Imaging Review Dg Chest 2 View  10/25/2014   CLINICAL DATA:  Vomiting for 2 weeks with eating, headache for 1 week, cervical spine fractures 3 weeks ago, hypertension, COPD, asthma, stroke  EXAM: CHEST  2 VIEW  COMPARISON:  09/28/2014  FINDINGS:  Enlargement of cardiac silhouette.  Tortuous aorta.  Pulmonary vascularity normal.  Bibasilar atelectasis and accentuation of interstitial markings.  Calcified granuloma LEFT upper lobe.  No definite infiltrate, pleural effusion or pneumothorax.  Bones diffusely demineralized.  Artifacts at lung apices from cervical spine collar.  IMPRESSION: Enlargement of cardiac silhouette with bibasilar atelectasis and chronic accentuation of interstitial markings.   Electronically Signed   By: Lavonia Dana M.D.   On: 10/25/2014 17:45   Ct Head Wo Contrast  10/25/2014   CLINICAL DATA:  Headache, imbalance, syncopal episode today at doctor's office  EXAM: CT HEAD WITHOUT CONTRAST  TECHNIQUE: Contiguous axial images were obtained from the base of the skull through the vertex without intravenous contrast.  COMPARISON:  09/22/2014  FINDINGS: Generalized atrophy.  Normal ventricular morphology.  No midline shift or mass effect.  Small vessel chronic ischemic changes of deep cerebral white matter.  No intracranial hemorrhage, mass lesion or evidence acute infarction.  No extra-axial fluid collections.  Scattered dural calcifications in falx.  Sinuses clear bones unremarkable.  IMPRESSION: Atrophy with small vessel chronic ischemic changes of deep cerebral white matter.  No acute intracranial abnormalities.   Electronically Signed   By: Lavonia Dana M.D.   On: 10/25/2014 17:56     EKG Interpretation   Date/Time:  Tuesday Oct 25 2014 17:10:50 EDT Ventricular Rate:  61 PR Interval:  183 QRS Duration: 87 QT Interval:  415 QTC Calculation: 418 R Axis:   14 Text Interpretation:  Sinus rhythm Low voltage, precordial leads LVH by  voltage ED PHYSICIAN INTERPRETATION AVAILABLE IN [facility] Confirmed  by TEST, Record ([id number]) on 10/27/2014 7:12:35 AM      MDM   Final diagnoses:  Syncope, unspecified syncope type  Lightheadedness    74 yo F with PMH of CVA, HTN, COPD, recent c-spine fx 5/18, presenting from PCP  office with lightheadedness, near-syncopal episode. Daughter states that pt was at PCP- stood up from chair and began to feel lightheaded so sat down.  Again tried to stand up and had to sit down due to lightheadedness. Pt reports intermittent HA, lightheadedness, N/V over past 2 weeks since c-spine fx.  HA more constant over past 4 days.  No falls since 2 weeks ago.  Reports lightheadedness with standing. States she has had vertigo in the past but today had only lightheadedness without vertigo.  Also complains of neck pain, as well as R scapular pain which has been ongoing since prior to fall 2 weeks ago.  States she was at PCP today to evaluate this.  She denies fevers, chest pain, SOB, cough, abdominal pain,  dysuria.  No weakness, numbness, tingling.  Does endorse decreased PO intake, daughter states she has to be reminded to drink water.  On presentation, pt alert, BP borderline at 98/48, vitals othewrise WNL.  Neuro exam with no focal deficits.  Pt in Aspen collar- +c-spine TTP.  CV and lung exam WNL.  Abdomen soft, non-tender.  No other acute findings.  Not significantly orthostatic.  Possible infection vs metabolic vs dehydration.  CVA less likely as pt denies vertigo and neuro exam non-focal. EKG shows no acute ischemic changes and pt denies chest pain, doubt ACS. Plan for CT head, labs, CXR.  Fluids given.  Imaging negative for acute abnormality.  Labs with mildly elevated Cr.  Pt reports resolution of HA and dizziness after IVF and PO intake.  With history of poor PO intake, mildly elevated Cr, and resolution of symptoms with hydration, and otherwise normal workup, suspect symptoms 2/2 dehydration.  Pt states she feels well and would like to go home.  She has close f/u scheduled, advised to keep all appts.  ED return precautions given.  No other concerns.  Discussed with attending Dr. Alvino Chapel.    Ellwood Dense, MD 10/27/14 [place], MD 10/28/14 [phone]

## 2014-10-25 NOTE — ED Notes (Signed)
Patient transported to X-ray 

## 2014-10-25 NOTE — Discharge Instructions (Signed)
Dizziness   Dizziness means you feel unsteady or lightheaded. You might feel like you are going to pass out (faint).  HOME CARE   · Drink enough fluids to keep your pee (urine) clear or pale yellow.  · Take your medicines exactly as told by your doctor. If you take blood pressure medicine, always stand up slowly from the lying or sitting position. Hold on to something to steady yourself.  · If you need to stand in one place for a long time, move your legs often. Tighten and relax your leg muscles.  · Have someone stay with you until you feel okay.  · Do not drive or use heavy machinery if you feel dizzy.  · Do not drink alcohol.  GET HELP RIGHT AWAY IF:   · You feel dizzy or lightheaded and it gets worse.  · You feel sick to your stomach (nauseous), or you throw up (vomit).  · You have trouble talking or walking.  · You feel weak or have trouble using your arms, hands, or legs.  · You cannot think clearly or have trouble forming sentences.  · You have chest pain, belly (abdominal) pain, sweating, or you are short of breath.  · Your vision changes.  · You are bleeding.  · You have problems from your medicine that seem to be getting worse.  MAKE SURE YOU:   · Understand these instructions.  · Will watch your condition.  · Will get help right away if you are not doing well or get worse.  Document Released: 05/02/2011 Document Revised: 08/05/2011 Document Reviewed: 05/02/2011  [employer]® Patient Information ©2015 [employer]. This information is not intended to replace advice given to you by your health care provider. Make sure you discuss any questions you have with your health care provider.

## 2014-10-26 LAB — URINE CULTURE: Colony Count: 8000

## 2014-10-27 ENCOUNTER — Other Ambulatory Visit: Payer: Self-pay | Admitting: Internal Medicine

## 2014-10-27 DIAGNOSIS — R1084 Generalized abdominal pain: Secondary | ICD-10-CM

## 2014-10-27 DIAGNOSIS — M4852XD Collapsed vertebra, not elsewhere classified, cervical region, subsequent encounter for fracture with routine healing: Secondary | ICD-10-CM | POA: Diagnosis not present

## 2014-10-28 ENCOUNTER — Encounter (HOSPITAL_COMMUNITY): Payer: Self-pay

## 2014-11-01 DIAGNOSIS — S22039D Unspecified fracture of third thoracic vertebra, subsequent encounter for fracture with routine healing: Secondary | ICD-10-CM | POA: Diagnosis not present

## 2014-11-01 DIAGNOSIS — S22029D Unspecified fracture of second thoracic vertebra, subsequent encounter for fracture with routine healing: Secondary | ICD-10-CM | POA: Diagnosis not present

## 2014-11-01 DIAGNOSIS — M5136 Other intervertebral disc degeneration, lumbar region: Secondary | ICD-10-CM | POA: Diagnosis not present

## 2014-11-01 DIAGNOSIS — F329 Major depressive disorder, single episode, unspecified: Secondary | ICD-10-CM | POA: Diagnosis not present

## 2014-11-01 DIAGNOSIS — F0390 Unspecified dementia without behavioral disturbance: Secondary | ICD-10-CM | POA: Diagnosis not present

## 2014-11-01 DIAGNOSIS — S129XXD Fracture of neck, unspecified, subsequent encounter: Secondary | ICD-10-CM | POA: Diagnosis not present

## 2014-11-03 DIAGNOSIS — J029 Acute pharyngitis, unspecified: Secondary | ICD-10-CM | POA: Diagnosis not present

## 2014-11-04 DIAGNOSIS — S129XXD Fracture of neck, unspecified, subsequent encounter: Secondary | ICD-10-CM | POA: Diagnosis not present

## 2014-11-04 DIAGNOSIS — S22029D Unspecified fracture of second thoracic vertebra, subsequent encounter for fracture with routine healing: Secondary | ICD-10-CM | POA: Diagnosis not present

## 2014-11-04 DIAGNOSIS — S22039D Unspecified fracture of third thoracic vertebra, subsequent encounter for fracture with routine healing: Secondary | ICD-10-CM | POA: Diagnosis not present

## 2014-11-04 DIAGNOSIS — M542 Cervicalgia: Secondary | ICD-10-CM | POA: Diagnosis not present

## 2014-11-04 DIAGNOSIS — F329 Major depressive disorder, single episode, unspecified: Secondary | ICD-10-CM | POA: Diagnosis not present

## 2014-11-04 DIAGNOSIS — F0390 Unspecified dementia without behavioral disturbance: Secondary | ICD-10-CM | POA: Diagnosis not present

## 2014-11-04 DIAGNOSIS — M5136 Other intervertebral disc degeneration, lumbar region: Secondary | ICD-10-CM | POA: Diagnosis not present

## 2014-11-07 ENCOUNTER — Ambulatory Visit
Admission: RE | Admit: 2014-11-07 | Discharge: 2014-11-07 | Disposition: A | Discharge status: Home / Self Care | Payer: Medicare Other | Source: Ambulatory Visit | Attending: Internal Medicine | Admitting: Internal Medicine

## 2014-11-07 ENCOUNTER — Other Ambulatory Visit: Payer: Medicare Other

## 2014-11-07 DIAGNOSIS — R1084 Generalized abdominal pain: Secondary | ICD-10-CM

## 2014-11-07 DIAGNOSIS — Z9049 Acquired absence of other specified parts of digestive tract: Secondary | ICD-10-CM | POA: Diagnosis not present

## 2014-11-07 DIAGNOSIS — N261 Atrophy of kidney (terminal): Secondary | ICD-10-CM | POA: Diagnosis not present

## 2014-11-07 DIAGNOSIS — K838 Other specified diseases of biliary tract: Secondary | ICD-10-CM | POA: Diagnosis not present

## 2014-11-11 DIAGNOSIS — M5136 Other intervertebral disc degeneration, lumbar region: Secondary | ICD-10-CM | POA: Diagnosis not present

## 2014-11-11 DIAGNOSIS — F0390 Unspecified dementia without behavioral disturbance: Secondary | ICD-10-CM | POA: Diagnosis not present

## 2014-11-11 DIAGNOSIS — F329 Major depressive disorder, single episode, unspecified: Secondary | ICD-10-CM | POA: Diagnosis not present

## 2014-11-11 DIAGNOSIS — S22039D Unspecified fracture of third thoracic vertebra, subsequent encounter for fracture with routine healing: Secondary | ICD-10-CM | POA: Diagnosis not present

## 2014-11-11 DIAGNOSIS — S22029D Unspecified fracture of second thoracic vertebra, subsequent encounter for fracture with routine healing: Secondary | ICD-10-CM | POA: Diagnosis not present

## 2014-11-11 DIAGNOSIS — H60311 Diffuse otitis externa, right ear: Secondary | ICD-10-CM | POA: Diagnosis not present

## 2014-11-11 DIAGNOSIS — S129XXD Fracture of neck, unspecified, subsequent encounter: Secondary | ICD-10-CM | POA: Diagnosis not present

## 2014-11-17 ENCOUNTER — Other Ambulatory Visit: Payer: Self-pay | Admitting: Neurosurgery

## 2014-11-17 DIAGNOSIS — Z6830 Body mass index (BMI) 30.0-30.9, adult: Secondary | ICD-10-CM | POA: Diagnosis not present

## 2014-11-17 DIAGNOSIS — M4852XD Collapsed vertebra, not elsewhere classified, cervical region, subsequent encounter for fracture with routine healing: Secondary | ICD-10-CM | POA: Diagnosis not present

## 2014-11-22 DIAGNOSIS — M4852XA Collapsed vertebra, not elsewhere classified, cervical region, initial encounter for fracture: Secondary | ICD-10-CM | POA: Diagnosis not present

## 2014-11-22 DIAGNOSIS — M4854XA Collapsed vertebra, not elsewhere classified, thoracic region, initial encounter for fracture: Secondary | ICD-10-CM | POA: Diagnosis not present

## 2014-12-05 ENCOUNTER — Encounter (HOSPITAL_COMMUNITY): Payer: Self-pay

## 2014-12-05 ENCOUNTER — Emergency Department (HOSPITAL_COMMUNITY)
Admission: EM | Admit: 2014-12-05 | Discharge: 2014-12-05 | Disposition: A | Discharge status: Home / Self Care | Payer: Medicare Other | Attending: Emergency Medicine | Admitting: Emergency Medicine

## 2014-12-05 DIAGNOSIS — Z79899 Other long term (current) drug therapy: Secondary | ICD-10-CM | POA: Diagnosis not present

## 2014-12-05 DIAGNOSIS — Z91040 Latex allergy status: Secondary | ICD-10-CM | POA: Diagnosis not present

## 2014-12-05 DIAGNOSIS — H9209 Otalgia, unspecified ear: Secondary | ICD-10-CM | POA: Diagnosis not present

## 2014-12-05 DIAGNOSIS — E538 Deficiency of other specified B group vitamins: Secondary | ICD-10-CM | POA: Diagnosis not present

## 2014-12-05 DIAGNOSIS — I10 Essential (primary) hypertension: Secondary | ICD-10-CM | POA: Insufficient documentation

## 2014-12-05 DIAGNOSIS — Z9849 Cataract extraction status, unspecified eye: Secondary | ICD-10-CM | POA: Diagnosis not present

## 2014-12-05 DIAGNOSIS — Z87891 Personal history of nicotine dependence: Secondary | ICD-10-CM | POA: Insufficient documentation

## 2014-12-05 DIAGNOSIS — H9201 Otalgia, right ear: Secondary | ICD-10-CM | POA: Diagnosis not present

## 2014-12-05 DIAGNOSIS — Z8781 Personal history of (healed) traumatic fracture: Secondary | ICD-10-CM | POA: Insufficient documentation

## 2014-12-05 DIAGNOSIS — M1990 Unspecified osteoarthritis, unspecified site: Secondary | ICD-10-CM | POA: Diagnosis not present

## 2014-12-05 DIAGNOSIS — K219 Gastro-esophageal reflux disease without esophagitis: Secondary | ICD-10-CM | POA: Insufficient documentation

## 2014-12-05 DIAGNOSIS — Z791 Long term (current) use of non-steroidal anti-inflammatories (NSAID): Secondary | ICD-10-CM | POA: Insufficient documentation

## 2014-12-05 DIAGNOSIS — F329 Major depressive disorder, single episode, unspecified: Secondary | ICD-10-CM | POA: Insufficient documentation

## 2014-12-05 DIAGNOSIS — Z8673 Personal history of transient ischemic attack (TIA), and cerebral infarction without residual deficits: Secondary | ICD-10-CM | POA: Diagnosis not present

## 2014-12-05 DIAGNOSIS — R52 Pain, unspecified: Secondary | ICD-10-CM | POA: Diagnosis not present

## 2014-12-05 DIAGNOSIS — D649 Anemia, unspecified: Secondary | ICD-10-CM | POA: Diagnosis not present

## 2014-12-05 DIAGNOSIS — E785 Hyperlipidemia, unspecified: Secondary | ICD-10-CM | POA: Diagnosis not present

## 2014-12-05 DIAGNOSIS — H5442 Blindness, left eye, normal vision right eye: Secondary | ICD-10-CM | POA: Insufficient documentation

## 2014-12-05 DIAGNOSIS — J449 Chronic obstructive pulmonary disease, unspecified: Secondary | ICD-10-CM | POA: Insufficient documentation

## 2014-12-05 NOTE — ED Notes (Signed)
PA at bedside.

## 2014-12-05 NOTE — Discharge Instructions (Signed)
Use your home pain medications for pain. Take motrin as needed for additional relief. Use heat to the area of pain. Follow up with your regular doctor and the doctor taking care of your neck fracture in 1 week for recheck of symptoms and ongoing management of your recurrent ear pain. Return to the ER for changes or worsening symptoms.   Otalgia The most common reason for this in children is an infection of the middle ear. Pain from the middle ear is usually caused by a build-up of fluid and pressure behind the eardrum. Pain from an earache can be sharp, dull, or burning. The pain may be temporary or constant. The middle ear is connected to the nasal passages by a short narrow tube called the Eustachian tube. The Eustachian tube allows fluid to drain out of the middle ear, and helps keep the pressure in your ear equalized. CAUSES  A cold or allergy can block the Eustachian tube with inflammation and the build-up of secretions. This is especially likely in small children, because their Eustachian tube is shorter and more horizontal. When the Eustachian tube closes, the normal flow of fluid from the middle ear is stopped. Fluid can accumulate and cause stuffiness, pain, hearing loss, and an ear infection if germs start growing in this area. SYMPTOMS  The symptoms of an ear infection may include fever, ear pain, fussiness, increased crying, and irritability. Many children will have temporary and minor hearing loss during and right after an ear infection. Permanent hearing loss is rare, but the risk increases the more infections a child has. Other causes of ear pain include retained water in the outer ear canal from swimming and bathing. Ear pain in adults is less likely to be from an ear infection. Ear pain may be referred from other locations. Referred pain may be from the joint between your jaw and the skull. It may also come from a tooth problem or problems in the neck. Other causes of ear pain include:  A  foreign body in the ear.  Outer ear infection.  Sinus infections.  Impacted ear wax.  Ear injury.  Arthritis of the jaw or TMJ problems.  Middle ear infection.  Tooth infections.  Sore throat with pain to the ears. DIAGNOSIS  Your caregiver can usually make the diagnosis by examining you. Sometimes other special studies, including x-rays and lab work may be necessary. TREATMENT   If antibiotics were prescribed, use them as directed and finish them even if you or your child's symptoms seem to be improved.  Sometimes PE tubes are needed in children. These are little plastic tubes which are put into the eardrum during a simple surgical procedure. They allow fluid to drain easier and allow the pressure in the middle ear to equalize. This helps relieve the ear pain caused by pressure changes. HOME CARE INSTRUCTIONS   Only take over-the-counter or prescription medicines for pain, discomfort, or fever as directed by your caregiver. DO NOT GIVE CHILDREN ASPIRIN because of the association of Reye's Syndrome in children taking aspirin.  Use a cold pack applied to the outer ear for 15-20 minutes, 03-04 times per day or as needed may reduce pain. Do not apply ice directly to the skin. You may cause frost bite.  Over-the-counter ear drops used as directed may be effective. Your caregiver may sometimes prescribe ear drops.  Resting in an upright position may help reduce pressure in the middle ear and relieve pain.  Ear pain caused by rapidly descending from  high altitudes can be relieved by swallowing or chewing gum. Allowing infants to suck on a bottle during airplane travel can help.  Do not smoke in the house or near children. If you are unable to quit smoking, smoke outside.  Control allergies. SEEK IMMEDIATE MEDICAL CARE IF:   You or your child are becoming sicker.  Pain or fever relief is not obtained with medicine.  You or your child's symptoms (pain, fever, or irritability) do  not improve within 24 to 48 hours or as instructed.  Severe pain suddenly stops hurting. This may indicate a ruptured eardrum.  You or your children develop new problems such as severe headaches, stiff neck, difficulty swallowing, or swelling of the face or around the ear. Document Released: 12/29/2003 Document Revised: 08/05/2011 Document Reviewed: 05/04/2008 [facility] Patient Information 2015 [place], [place]. This information is not intended to replace advice given to you by your health care provider. Make sure you discuss any questions you have with your health care provider.

## 2014-12-05 NOTE — ED Provider Notes (Signed)
CSN: [id number]     Arrival date & time 12/05/14  6010 History   First MD Initiated Contact with Patient 12/05/14 [phone]     Chief Complaint  Patient presents with  . Otalgia     (Consider location/radiation/quality/duration/timing/severity/associated sxs/prior Treatment) HPI Comments: [name] is a 74 y.o. female with a PMHx of hiatal hernia, anemia, GERD, HLD, depression, TIA/CVA, OA, reactive airway disease, B12 deficiency, osteoporosis, DDD, COPD, HTN, macular degeneration, and recent Cspine fracture 61mo ago, who presents to the ED with complaints of recurrent ongoing intermittent right ear pain 2 months since her cervical spine fracture. She reports his pain is 7/10 intermittent sharp nonradiating pain with no known aggravating factors and relieved somewhat with heat. She has been to a doctor before for this and was prescribed antibiotic drops which did not change her symptoms. She denies any recent travel or injury to the ear, ear drainage, hearing loss, tinnitus, lightheadedness, headache, vision changes, fevers, chills, rhinorrhea, sore throat, cough, chest pain, shortness breath, abdominal pain, nausea, vomiting, diarrhea, constipation, dysuria, hematuria, numbness, tingling, or weakness. Denies any sick contacts.  Patient is a 74y.o. female presenting with ear pain. The history is provided by the patient. No language interpreter was used.  Otalgia Location:  Right Behind ear:  No abnormality Quality:  Sharp Severity:  Moderate Onset quality:  Gradual Duration:  2 months Timing:  Intermittent Progression:  Unchanged Chronicity:  Recurrent Context: not direct blow, not elevation change, not foreign body in ear, not loud noise and no water in ear   Relieved by: heat. Worsened by:  Nothing tried Ineffective treatments:  None tried Associated symptoms: no abdominal pain, no cough, no diarrhea, no ear discharge, no fever, no headaches, no hearing loss, no rhinorrhea, no sore  throat, no tinnitus and no vomiting     Past Medical History  Diagnosis Date  . Hiatal hernia   . Anemia   . GERD (gastroesophageal reflux disease)   . Hyperlipidemia   . Depression     major  . Stroke     "mini stroke"  . Osteoarthritis   . Reactive airway disease   . B12 deficiency   . Osteoporosis   . Rupture of bowel   . Bladder incontinence   . DDD (degenerative disc disease), lumbar   . Cataract   . COPD (chronic obstructive pulmonary disease)   . Hypertension   . Stroke   . Macular degeneration   . Blind left eye   . Cervical spine fracture    Past Surgical History  Procedure Laterality Date  . Abdominal surgery    . Colon surgery    . Joint replacement    . Appendectomy    . Bladder surgery    . Rectocele repair    . Knee surgery    . Cholecystectomy    . Abdominal hysterectomy    . Ostomy    . Eye surgery      cataracts   Family History  Problem Relation Age of Onset  . Heart failure Mother   . Heart attack Father    History  Substance Use Topics  . Smoking status: Former Smoker -- 1.50 packs/day for 30 years  . Smokeless tobacco: Not on file     Comment: quit 2013  . Alcohol Use: No   OB History    Gravida Para Term Preterm AB TAB SAB Ectopic Multiple Living   0 0 0 0 0 0 0 0  Review of Systems  Constitutional: Negative for fever and chills.  HENT: Positive for ear pain. Negative for ear discharge, hearing loss, rhinorrhea, sneezing, sore throat, tinnitus and trouble swallowing.   Eyes: Negative for pain, discharge and visual disturbance.  Respiratory: Negative for cough and shortness of breath.   Cardiovascular: Negative for chest pain.  Gastrointestinal: Negative for nausea, vomiting, abdominal pain, diarrhea and constipation.  Genitourinary: Negative for dysuria and hematuria.  Musculoskeletal: Negative for myalgias and arthralgias.  Skin: Negative for color change.  Allergic/Immunologic: Negative for immunocompromised state.   Neurological: Negative for weakness, numbness and headaches.  Psychiatric/Behavioral: Negative for confusion.   10 Systems reviewed and are negative for acute change except as noted in the HPI.    Allergies  Ampicillin; Oxycodone-acetaminophen; Toradol; Ambien; Ampicillin-sulbactam sodium; Oxycontin; Tape; Toradol; Tramadol; Cephalexin; Latex; Percocet; Sulfamethoxazole-trimethoprim; and Tramadol  Home Medications   Prior to Admission medications   Medication Sig Start Date End Date Taking? Authorizing Provider  acetaminophen (TYLENOL) 500 MG tablet Take 500 mg by mouth every 6 (six) hours as needed for fever.    Historical Provider, MD  ADVAIR DISKUS 250-50 MCG/DOSE AEPB Inhale 1 capsule into the lungs daily. 09/22/14   Historical Provider, MD  albuterol (ACCUNEB) 0.63 MG/3ML nebulizer solution Take 1 ampule by nebulization every 6 (six) hours as needed for wheezing.    Historical Provider, MD  albuterol (PROVENTIL HFA;VENTOLIN HFA) 108 (90 BASE) MCG/ACT inhaler Inhale 1 puff into the lungs every 6 (six) hours as needed for wheezing or shortness of breath.    Historical Provider, MD  albuterol (PROVENTIL) (2.5 MG/3ML) 0.083% nebulizer solution Take 2.5 mg by nebulization every 6 (six) hours as needed for wheezing or shortness of breath.    Historical Provider, MD  ALPRAZolam Duanne Moron) 1 MG tablet Take 1 mg by mouth at bedtime.    Historical Provider, MD  ALPRAZolam Duanne Moron) 1 MG tablet Take 0.5-1 mg by mouth 2 (two) times daily as needed. Take 1 mg at bedtime as needed for sleep. May take 0.5 mg daily as needed for anxiety. 09/08/14   Historical Provider, MD  amLODipine (NORVASC) 5 MG tablet Take 2.5 mg by mouth 2 (two) times daily.  04/06/13   Historical Provider, MD  amLODipine (NORVASC) 5 MG tablet Take 5 mg by mouth daily. 09/22/14   Historical Provider, MD  citalopram (CELEXA) 40 MG tablet Take 20 mg by mouth daily. Patient take a half of tab daily    Historical Provider, MD  citalopram  (CELEXA) 40 MG tablet Take 20 mg by mouth every morning. 08/26/14   Historical Provider, MD  dicyclomine (BENTYL) 20 MG tablet Take 20 mg by mouth daily as needed for spasms.     Historical Provider, MD  diphenhydrAMINE (BENADRYL) 25 MG tablet Take 25 mg by mouth every 6 (six) hours as needed for itching.    Historical Provider, MD  DOCUSATE SODIUM PO Take 1 capsule by mouth daily.    Historical Provider, MD  doxycycline (VIBRAMYCIN) 100 MG capsule Take 1 capsule (100 mg total) by mouth 2 (two) times daily. 10/10/14   Verlee Monte, MD  esomeprazole (NEXIUM) 40 MG capsule Take 40 mg by mouth 2 (two) times daily.    Historical Provider, MD  gabapentin (NEURONTIN) 300 MG capsule Take 300 mg by mouth 3 (three) times daily. 09/20/14   Historical Provider, MD  gabapentin (NEURONTIN) 300 MG capsule Take 300 mg by mouth 3 (three) times daily.    Historical Provider, MD  HYDROcodone-acetaminophen (NORCO/VICODIN) 5-325  MG per tablet Take 1-2 tablets by mouth 3 (three) times daily as needed for moderate pain or severe pain.  08/26/14   Historical Provider, MD  HYDROcodone-acetaminophen (VICODIN) 5-500 MG per tablet Take 1 tablet by mouth every 6 (six) hours as needed. For pain    Historical Provider, MD  lovastatin (MEVACOR) 10 MG tablet Take 10 mg by mouth every morning.     Historical Provider, MD  lovastatin (MEVACOR) 10 MG tablet Take 10 mg by mouth daily. 09/22/14   Historical Provider, MD  meclizine (ANTIVERT) 12.5 MG tablet Take 12.5 mg by mouth 3 (three) times daily as needed for nausea.    Historical Provider, MD  meclizine (ANTIVERT) 25 MG tablet Take 25 mg by mouth 3 (three) times daily as needed for dizziness.  08/26/14   Historical Provider, MD  meloxicam (MOBIC) 7.5 MG tablet Take 7.5 mg by mouth daily.    Historical Provider, MD  NEXIUM 40 MG capsule Take 40 mg by mouth 2 (two) times daily before a meal.  08/26/14   Historical Provider, MD  nystatin (MYCOSTATIN) 100000 UNIT/ML suspension Take 5 mLs by mouth  as directed. Swish and spit. 08/02/14   Historical Provider, MD  oxyCODONE-acetaminophen (PERCOCET/ROXICET) 5-325 MG per tablet Take 1 tablet by mouth every 6 (six) hours as needed for severe pain. 09/22/14   Merryl Hacker, MD  polyethylene glycol powder (GLYCOLAX/MIRALAX) powder Take 17 g by mouth daily. For constipation    Historical Provider, MD  polyethylene glycol powder (GLYCOLAX/MIRALAX) powder Take 17 g by mouth daily. 07/12/14   Historical Provider, MD  promethazine (PHENERGAN) 12.5 MG suppository Place 12.5 mg rectally every 6 (six) hours as needed for nausea or vomiting.    Historical Provider, MD  PROMETHEGAN 12.5 MG suppository Place 1 suppository rectally daily as needed for nausea or vomiting.  07/16/14   Historical Provider, MD  senna (SENOKOT) 8.6 MG tablet Take 1-2 tablets by mouth daily.     Historical Provider, MD  sennosides-docusate sodium (SENOKOT-S) 8.6-50 MG tablet Take 1 tablet by mouth daily.    Historical Provider, MD  Soft Lens Products (RA REWETTING DROPS) SOLN 1 application by Does not apply route daily.    Historical Provider, MD  tiZANidine (ZANAFLEX) 4 MG capsule Take 2 mg by mouth at bedtime.     Historical Provider, MD  tiZANidine (ZANAFLEX) 4 MG tablet Take 2 mg by mouth at bedtime. 08/31/14   Historical Provider, MD  traZODone (DESYREL) 50 MG tablet Take 0.5 tablets (25 mg total) by mouth at bedtime. 02/23/14   Philmore Pali, NP  traZODone (DESYREL) 50 MG tablet Take 50 mg by mouth at bedtime. 08/26/14   Historical Provider, MD  VENTOLIN HFA 108 (90 BASE) MCG/ACT inhaler Take 2 puffs by mouth every 6 (six) hours as needed. 09/22/14   Historical Provider, MD  vitamin B-12 (CYANOCOBALAMIN) 1000 MCG tablet Take 1,000 mcg by mouth daily.    Historical Provider, MD  vitamin B-12 (CYANOCOBALAMIN) 1000 MCG tablet Take 1,000 mcg by mouth daily.    Historical Provider, MD   BP 121/58 mmHg  Pulse 61  Temp(Src) 97.6 F (36.4 C) (Oral)  Resp 20  Ht '5\' 5"'$  (1.651 m)  Wt 185 lb  (83.915 kg)  BMI 30.79 kg/m2  SpO2 92% Physical Exam  Constitutional: She is oriented to person, place, and time. Vital signs are normal. She appears well-developed and well-nourished.  Non-toxic appearance. No distress. Cervical collar in place.  Afebrile, nontoxic, NAD. Aspen collar  in place  HENT:  Head: Normocephalic and atraumatic.  Right Ear: Hearing, tympanic membrane, external ear and ear canal normal. No mastoid tenderness. Tympanic membrane is not injected, not erythematous and not bulging. No middle ear effusion.  Left Ear: Hearing, tympanic membrane, external ear and ear canal normal. No mastoid tenderness. Tympanic membrane is not injected, not erythematous and not bulging.  No middle ear effusion.  Nose: Nose normal.  Mouth/Throat: Uvula is midline, oropharynx is clear and moist and mucous membranes are normal. No trismus in the jaw. No uvula swelling.  Ears are clear bilaterally, no mastoid tenderness or tenderness with pinna retraction. No TM bulging or effusion, no erythema of canal or TM, no drainage. Nose clear. Oropharynx clear and moist, without uvular swelling or deviation, no trismus or drooling, no tonsillar swelling or erythema, no exudates.    Eyes: Conjunctivae and EOM are normal. Pupils are equal, round, and reactive to light. Right eye exhibits no discharge. Left eye exhibits no discharge.  Neck:  Aspen collar in place  Cardiovascular: Normal rate, regular rhythm, normal heart sounds and intact distal pulses.  Exam reveals no gallop and no friction rub.   No murmur heard. Pulmonary/Chest: Effort normal and breath sounds normal. No respiratory distress. She has no decreased breath sounds. She has no wheezes. She has no rhonchi. She has no rales.  Abdominal: Soft. Normal appearance and bowel sounds are normal. She exhibits no distension. There is no tenderness. There is no rigidity, no rebound and no guarding.  Musculoskeletal: Normal range of motion.  Neurological:  She is alert and oriented to person, place, and time. She has normal strength. No sensory deficit.  Skin: Skin is warm, dry and intact. No rash noted.  Psychiatric: She has a normal mood and affect.  Nursing note and vitals reviewed.   ED Course  Procedures (including critical care time) Labs Review Labs Reviewed - No data to display  Imaging Review No results found.   EKG Interpretation None      MDM   Final diagnoses:  Otalgia, right    74 y.o. female here with R ear pain intermittent x2 months since her c-spine fx. Worse this morning but she hasn't taken anything for it. Has been on abx in the past, but this hasn't changed her symptoms. Cannot recall who is caring for her neck fx. States she has pain meds at home. Ears clear bilaterally without signs of infection, TM nonerythematous without bulging. Likely referred pain from neck, no other concerning s/sx, doubt need for further work up. Will have her f/up with her orthopedist for her neck pain and potentially to discuss a medication for nerve pain such as gabapentin/lyrica. I explained the diagnosis and have given explicit precautions to return to the ER including for any other new or worsening symptoms. The patient understands and accepts the medical plan as it's been dictated and I have answered their questions. Discharge instructions concerning home care and prescriptions have been given. The patient is STABLE and is discharged to home in good condition.  BP 121/58 mmHg  Pulse 61  Temp(Src) 97.6 F (36.4 C) (Oral)  Resp 20  Ht '5\' 5"'$  (1.651 m)  Wt 185 lb (83.915 kg)  BMI 30.79 kg/m2  SpO2 92%  Xian Alves Camprubi-Soms, PA-C 12/05/14 0910  Malvin Johns, MD 12/05/14 [phone]

## 2014-12-05 NOTE — ED Notes (Signed)
Pt reports right ear pain that began this morning.  Pt denies any injury, drainage or bleeding.

## 2014-12-06 ENCOUNTER — Encounter: Payer: Self-pay | Admitting: Diagnostic Neuroimaging

## 2014-12-06 ENCOUNTER — Ambulatory Visit (INDEPENDENT_AMBULATORY_CARE_PROVIDER_SITE_OTHER): Payer: Medicare Other | Admitting: Diagnostic Neuroimaging

## 2014-12-06 VITALS — BP 128/79 | HR 73 | Ht 65.0 in

## 2014-12-06 DIAGNOSIS — R630 Anorexia: Secondary | ICD-10-CM

## 2014-12-06 DIAGNOSIS — R42 Dizziness and giddiness: Secondary | ICD-10-CM

## 2014-12-06 DIAGNOSIS — R269 Unspecified abnormalities of gait and mobility: Secondary | ICD-10-CM

## 2014-12-06 DIAGNOSIS — M542 Cervicalgia: Secondary | ICD-10-CM | POA: Diagnosis not present

## 2014-12-06 NOTE — Progress Notes (Signed)
While walking down hall to exam room patient closed her eyes. Her daughter was holding onto her right arm. Her daughter and this RN held patient by her arms and slowly lowered her to the floor in a sitting position. Her daughter was talking to her, and after approximately 30 seconds the patient opened her eyes and said "hi". Daughter states this has been occuring approximately 3-4 x a week since her C spine fracture. Patient is wearing c collar body brace. Patient was assisted into wheelchair by 2 RNs and daughter. Patient alert, awake and oriented. Dr Jannifer Franklin aware. VS stable. Dr Leta Baptist notified.

## 2014-12-06 NOTE — Progress Notes (Signed)
[facility]  PATIENT: [name] DOB: [birth year]  REFERRING CLINICIAN: Husain HISTORY FROM: patient., daughter REASON FOR VISIT: follow up   HISTORICAL  CHIEF COMPLAINT:  Chief Complaint  Patient presents with  . Pain    rm 6, cervical pain, "dizzy, passes out"  . Follow-up    daughter, sister    HISTORY OF PRESENT ILLNESS:   UPDATE 12/06/14 (VRP): Since last visit, had more syncope events, falls, now with compression fractures in spine (C7, T2, T3). Now with more neck pain, right ear pain. Seeing Dr. Saintclair Halsted, and has conservative mgmt with c-collar. Had an event in the office where she briefly slumped, and rapidly awoke. Vitals stable.   UPDATE 06/13/14 (VRP): Since last visit, still with stress, poor PO intake, another syncope event around christmas 2015 (didn't go to hospital), memory loss, staying cold all the time.   UPDATE 10/19/13 (LL): Since last visit, Home Health PT for gait and balance training; vestibular rehab was ordered and completed. TSH was normal. 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 No acute findings. No change from MRI on 03/30/12. Basically her symptoms have not changed. Daughter lives with her and helps with daily ADLs. Also helps her do vestibular exercises.  UPDATE 06/18/13 (LL): Patient returns for follow up. Daughter requests sooner revisit, patient's dizziness has not improved. She did not get labs drawn that were ordered at last visit (B12, TSH, and HgbA1C). She states her PCP checks her sugar and B12. Her dizziness is mainly brought on by quick changes in position. She states she has tried vestibular rehab exercises before and it did not help. She denies falls. She has tried Meclizine in the past with no benefit.   UPDATE 05/03/13 (VRP): Patient presents as referral for recurrent dizziness, balance difficulty, passing out. Patient tells me that she is feeling swimmy headed,  having spinning sensation with vertigo, lasting a few minutes at a time. This typically triggered by changing position. Sometimes this happens when she is sitting or without movement. Patient is also episodes of passing out, with loss of consciousness for a few minutes. No convulsions. She has prodromal lightheadedness this happens. No post ictal confusion. No tongue biting or incontinence with these episodes. Also significantly noted is poor balance and gait with intermittent falling down. Her depression and anxiety are also poorly controlled at this point. She has significant hallucinations, racing thoughts, poor sleep. Patient's memory is getting worse over time. She does not remember seeing me in October 2013.  PRIOR HPI (03/13/12): 74 year old right-handed female with history of hypertension, hypercholesterolemia, depression, anxiety, here for evaluation of dizziness. For past 6-7 years patient has been having intermittent episodes of room spinning sensation, lasting 45 seconds at a time. No nausea vomiting. Episodes typically triggered when she turns her head too quickly, tilt her head up or down. She denies any slurred speech, trouble talking, trouble swallowing, double vision. Patient had one episode while she was sitting down, without head turning movement, where she felt dizzy, spinning, and then passed out.   REVIEW OF SYSTEMS: Full 14 system review of systems performed and notable only for decreased activity chills fatigue wheezing shortness of breath constipation nausea vomiting chest pain insomnia restless leg snoring sleep talking joint pain back pain bladder incontinence memory loss dizziness headache numbness weakness passing out depression.   ALLERGIES: Allergies  Allergen Reactions  . Ampicillin Nausea Only and Other (See Comments)    Thrush.   Marland Kitchen  Oxycodone-Acetaminophen Other (See Comments)  . Toradol [Ketorolac Tromethamine] Nausea Only and Other (See Comments)    Headaches, back  aches  . Ambien [Zolpidem Tartrate] Other (See Comments)    unknown  . Ampicillin-Sulbactam Sodium Other (See Comments)    Muscles draw up tight - severe headache  . Oxycontin [Oxycodone Hcl] Other (See Comments)    dizziness  . Tape Other (See Comments)    Paper tape only.  . Toradol [Ketorolac Tromethamine] Nausea Only  . Tramadol Nausea Only  . Cephalexin Other (See Comments)    unknown  . Latex Other (See Comments)    Makes skin raw  . Percocet [Oxycodone-Acetaminophen] Itching and Rash    Vicodin is OK  . Sulfamethoxazole-Trimethoprim Other (See Comments)    unknown  . Tramadol Rash and Other (See Comments)    Headache.     HOME MEDICATIONS: Outpatient Prescriptions Prior to Visit  Medication Sig Dispense Refill  . acetaminophen (TYLENOL) 500 MG tablet Take 500 mg by mouth every 6 (six) hours as needed for fever.    Marland Kitchen ADVAIR DISKUS 250-50 MCG/DOSE AEPB Inhale 1 capsule into the lungs daily.    Marland Kitchen albuterol (ACCUNEB) 0.63 MG/3ML nebulizer solution Take 1 ampule by nebulization every 6 (six) hours as needed for wheezing.    Marland Kitchen albuterol (PROVENTIL HFA;VENTOLIN HFA) 108 (90 BASE) MCG/ACT inhaler Inhale 1 puff into the lungs every 6 (six) hours as needed for wheezing or shortness of breath.    Marland Kitchen albuterol (PROVENTIL) (2.5 MG/3ML) 0.083% nebulizer solution Take 2.5 mg by nebulization every 6 (six) hours as needed for wheezing or shortness of breath.    . ALPRAZolam (XANAX) 1 MG tablet Take 1 mg by mouth at bedtime.    . ALPRAZolam (XANAX) 1 MG tablet Take 0.5-1 mg by mouth 2 (two) times daily as needed. Take 1 mg at bedtime as needed for sleep. May take 0.5 mg daily as needed for anxiety.    Marland Kitchen amLODipine (NORVASC) 5 MG tablet Take 2.5 mg by mouth 2 (two) times daily.     Marland Kitchen amLODipine (NORVASC) 5 MG tablet Take 5 mg by mouth daily.    . citalopram (CELEXA) 40 MG tablet Take 20 mg by mouth daily. Patient take a half of tab daily    . citalopram (CELEXA) 40 MG tablet Take 20 mg by  mouth every morning.    . dicyclomine (BENTYL) 20 MG tablet Take 20 mg by mouth daily as needed for spasms.     . diphenhydrAMINE (BENADRYL) 25 MG tablet Take 25 mg by mouth every 6 (six) hours as needed for itching.    Marland Kitchen DOCUSATE SODIUM PO Take 1 capsule by mouth daily.    Marland Kitchen doxycycline (VIBRAMYCIN) 100 MG capsule Take 1 capsule (100 mg total) by mouth 2 (two) times daily. 14 capsule 0  . esomeprazole (NEXIUM) 40 MG capsule Take 40 mg by mouth 2 (two) times daily.    Marland Kitchen gabapentin (NEURONTIN) 300 MG capsule Take 300 mg by mouth 3 (three) times daily.    Marland Kitchen gabapentin (NEURONTIN) 300 MG capsule Take 300 mg by mouth 3 (three) times daily.    Marland Kitchen HYDROcodone-acetaminophen (NORCO/VICODIN) 5-325 MG per tablet Take 1-2 tablets by mouth 3 (three) times daily as needed for moderate pain or severe pain.     Marland Kitchen HYDROcodone-acetaminophen (VICODIN) 5-500 MG per tablet Take 1 tablet by mouth every 6 (six) hours as needed. For pain    . lovastatin (MEVACOR) 10 MG tablet Take 10 mg by  mouth every morning.     . lovastatin (MEVACOR) 10 MG tablet Take 10 mg by mouth daily.    . meclizine (ANTIVERT) 12.5 MG tablet Take 12.5 mg by mouth 3 (three) times daily as needed for nausea.    . meclizine (ANTIVERT) 25 MG tablet Take 25 mg by mouth 3 (three) times daily as needed for dizziness.     . meloxicam (MOBIC) 7.5 MG tablet Take 7.5 mg by mouth daily.    Marland Kitchen NEXIUM 40 MG capsule Take 40 mg by mouth 2 (two) times daily before a meal.     . nystatin (MYCOSTATIN) 100000 UNIT/ML suspension Take 5 mLs by mouth as directed. Swish and spit.    Marland Kitchen oxyCODONE-acetaminophen (PERCOCET/ROXICET) 5-325 MG per tablet Take 1 tablet by mouth every 6 (six) hours as needed for severe pain. 10 tablet 0  . polyethylene glycol powder (GLYCOLAX/MIRALAX) powder Take 17 g by mouth daily. For constipation    . polyethylene glycol powder (GLYCOLAX/MIRALAX) powder Take 17 g by mouth daily.    . promethazine (PHENERGAN) 12.5 MG suppository Place 12.5 mg  rectally every 6 (six) hours as needed for nausea or vomiting.    Marland Kitchen PROMETHEGAN 12.5 MG suppository Place 1 suppository rectally daily as needed for nausea or vomiting.     . senna (SENOKOT) 8.6 MG tablet Take 1-2 tablets by mouth daily.     . sennosides-docusate sodium (SENOKOT-S) 8.6-50 MG tablet Take 1 tablet by mouth daily.    . Soft Lens Products (RA REWETTING DROPS) SOLN 1 application by Does not apply route daily.    Marland Kitchen tiZANidine (ZANAFLEX) 4 MG capsule Take 2 mg by mouth at bedtime.     Marland Kitchen tiZANidine (ZANAFLEX) 4 MG tablet Take 2 mg by mouth at bedtime.    . traZODone (DESYREL) 50 MG tablet Take 0.5 tablets (25 mg total) by mouth at bedtime. 30 tablet 3  . traZODone (DESYREL) 50 MG tablet Take 50 mg by mouth at bedtime.    . VENTOLIN HFA 108 (90 BASE) MCG/ACT inhaler Take 2 puffs by mouth every 6 (six) hours as needed.    . vitamin B-12 (CYANOCOBALAMIN) 1000 MCG tablet Take 1,000 mcg by mouth daily.    . vitamin B-12 (CYANOCOBALAMIN) 1000 MCG tablet Take 1,000 mcg by mouth daily.     No facility-administered medications prior to visit.    PAST MEDICAL HISTORY: Past Medical History  Diagnosis Date  . Hiatal hernia   . Anemia   . GERD (gastroesophageal reflux disease)   . Hyperlipidemia   . Depression     major  . Stroke     "mini stroke"  . Osteoarthritis   . Reactive airway disease   . B12 deficiency   . Osteoporosis   . Rupture of bowel   . Bladder incontinence   . DDD (degenerative disc disease), lumbar   . Cataract   . COPD (chronic obstructive pulmonary disease)   . Hypertension   . Stroke   . Macular degeneration   . Blind left eye   . Cervical spine fracture   . Compression fracture     C7,  upper T spine -compression fx    PAST SURGICAL HISTORY: Past Surgical History  Procedure Laterality Date  . Abdominal surgery    . Colon surgery    . Joint replacement    . Appendectomy    . Bladder surgery    . Rectocele repair    . Knee surgery    .  Cholecystectomy    . Abdominal hysterectomy    . Ostomy    . Eye surgery      cataracts    FAMILY HISTORY: Family History  Problem Relation Age of Onset  . Heart failure Mother   . Heart attack Father     SOCIAL HISTORY:  History   Social History  . Marital Status: Single    Spouse Name: N/A  . Number of Children: 3  . Years of Education: middle sch   Occupational History  . retired     n/a   Social History Main Topics  . Smoking status: Former Smoker -- 1.50 packs/day for 30 years  . Smokeless tobacco: Not on file     Comment: quit 2013  . Alcohol Use: No  . Drug Use: No  . Sexual Activity: No   Other Topics Concern  . Not on file   Social History Narrative   ** Merged History Encounter **       Patient lives at home with daughter. Caffeine Use: 4 cups daily     PHYSICAL EXAM  Filed Vitals:   12/06/14 1442  BP: 128/79  Pulse: 73  Height: '5\' 5"'$  (1.651 m)    Not recorded      There is no weight on file to calculate BMI.  MMSE - Mini Mental State Exam 12/06/2014 12/06/2014  Orientation to time - 4  Orientation to Place - 4  Registration - 3  Attention/ Calculation - 0  Recall - 1  Language- name 2 objects - 2  Language- repeat - 0  Language- follow 3 step command - 3  Language- read & follow direction - 1  Write a sentence (No Data) 0  Copy design (No Data) 0  Total score - 18      GENERAL EXAM: Patient is in no distress; well developed, nourished and groomed; IN C-COLLAR; TEARFUL  CARDIOVASCULAR: Regular rate and rhythm, no murmurs, no carotid bruits  NEUROLOGIC: MENTAL STATUS: awake, alert, language fluent, comprehension intact, naming intact, fund of knowledge appropriate CRANIAL NERVE: pupils equal and reactive to light, visual fields full to confrontation, extraocular muscles intact, no nystagmus, facial sensation and strength symmetric, hearing intact, palate elevates symmetrically, uvula midline, shoulder shrug symmetric, tongue  midline. MOTOR: normal bulk and tone, full strength in the BUE, BLE SENSORY: normal and symmetric to light touch, temperature, vibration COORDINATION: finger-nose-finger --> DYSMETRIA REFLEXES: deep tendon reflexes present and symmetric GAIT/STATION: UNSTEADY GAIT    DIAGNOSTIC DATA (LABS, IMAGING, TESTING) - I reviewed patient records, labs, notes, testing and imaging myself where available.  Lab Results  Component Value Date   WBC 5.1 10/25/2014   HGB 13.4 10/25/2014   HCT 40.6 10/25/2014   MCV 89.2 10/25/2014   PLT 297 10/25/2014      Component Value Date/Time   NA 140 10/25/2014 2034   K 4.1 10/25/2014 2034   CL 107 10/25/2014 2034   CO2 25 10/25/2014 2034   GLUCOSE 98 10/25/2014 2034   BUN 10 10/25/2014 2034   CREATININE 1.26* 10/25/2014 2034   CALCIUM 9.2 10/25/2014 2034   PROT 7.4 10/25/2014 2034   ALBUMIN 3.5 10/25/2014 2034   AST 30 10/25/2014 2034   ALT 21 10/25/2014 2034   ALKPHOS 115 10/25/2014 2034   BILITOT 0.5 10/25/2014 2034   GFRNONAA 41* 10/25/2014 2034   GFRAA 48* 10/25/2014 2034   No results found for: CHOL Lab Results  Component Value Date   HGBA1C 5.6 10/05/2014  No results found for: VITAMINB12 Lab Results  Component Value Date   TSH 1.351 07/30/2013    03/30/12 MRI BRAIN - 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 There mild changes of chronic microvascular ischemia and generalized cerebral atrophy.  03/30/12 MRA head - mild atheromatous changes in the terminal branch vessels.  03/30/12 MRA neck - normal  06/11/13 MRI brain (without) demonstrating: 1. Few punctate foci of non-specific gliosis in the periventricular and subcortical white matter, likely chronic small vessel ischemic disease. 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 2. No acute findings.  10/08/14 MRI CERVICAL SPINE: Acute moderate C7 compression fracture. No malalignment. Degenerative cervical spine result  in moderate canal stenosis at C4-5, mild at C5-6. Multilevel neural foraminal narrowing: Moderate to severe on the LEFT at C4-5 and C5-6.  10/08/14 MRI THORACIC SPINE: Acute moderate T2 compression fracture. Mild acute T3 compression fracture. No malalignment.    ASSESSMENT AND PLAN  74 y.o. year old female here with constellation of issues/symptoms, likely related to increased stress and decreased PO intake. No clear unifying neurologic diagnosis. Now with compression fracture and neck / right ear pain since May 2016. Also with memory loss, suspicious for dementia.    "Dizziness" (balance difficulty + vertigo + syncope)  Memory loss - MMSE 28/30 in 05/03/13  Headaches  Depression  Anxiety  Poor PO intake  Gastroparesis    PLAN: I spent 15 minutes of face to face time with patient. Greater than 50% of time was spent in counseling and coordination of care with patient. In summary we discussed:  - advised to focus on physical activity in daytime, proper sleep at night, small frequent / regular meals (currently eating once a day), increased water intake - recommend pain mgmt via PCP or pain mgmt clinic (see PCP for referral)  Return if symptoms worsen or fail to improve, for return to PCP.    Penni Bombard, MD 5/44/9201, 0:07 PM Certified in Neurology, Neurophysiology and Neuroimaging  [facility] [address] [place], [place] [address] [phone]

## 2014-12-22 ENCOUNTER — Encounter (HOSPITAL_COMMUNITY): Payer: Self-pay | Admitting: Emergency Medicine

## 2014-12-22 ENCOUNTER — Emergency Department (HOSPITAL_COMMUNITY): Payer: Medicare Other

## 2014-12-22 ENCOUNTER — Emergency Department (HOSPITAL_COMMUNITY)
Admission: EM | Admit: 2014-12-22 | Discharge: 2014-12-22 | Disposition: A | Discharge status: Home / Self Care | Payer: Medicare Other | Attending: Emergency Medicine | Admitting: Emergency Medicine

## 2014-12-22 DIAGNOSIS — Z79899 Other long term (current) drug therapy: Secondary | ICD-10-CM | POA: Insufficient documentation

## 2014-12-22 DIAGNOSIS — Z8673 Personal history of transient ischemic attack (TIA), and cerebral infarction without residual deficits: Secondary | ICD-10-CM | POA: Diagnosis not present

## 2014-12-22 DIAGNOSIS — Z791 Long term (current) use of non-steroidal anti-inflammatories (NSAID): Secondary | ICD-10-CM | POA: Insufficient documentation

## 2014-12-22 DIAGNOSIS — M542 Cervicalgia: Secondary | ICD-10-CM | POA: Diagnosis not present

## 2014-12-22 DIAGNOSIS — Z87891 Personal history of nicotine dependence: Secondary | ICD-10-CM | POA: Diagnosis not present

## 2014-12-22 DIAGNOSIS — M1990 Unspecified osteoarthritis, unspecified site: Secondary | ICD-10-CM | POA: Diagnosis not present

## 2014-12-22 DIAGNOSIS — I10 Essential (primary) hypertension: Secondary | ICD-10-CM | POA: Insufficient documentation

## 2014-12-22 DIAGNOSIS — E785 Hyperlipidemia, unspecified: Secondary | ICD-10-CM | POA: Diagnosis not present

## 2014-12-22 DIAGNOSIS — E538 Deficiency of other specified B group vitamins: Secondary | ICD-10-CM | POA: Diagnosis not present

## 2014-12-22 DIAGNOSIS — Z91040 Latex allergy status: Secondary | ICD-10-CM | POA: Insufficient documentation

## 2014-12-22 DIAGNOSIS — F329 Major depressive disorder, single episode, unspecified: Secondary | ICD-10-CM | POA: Diagnosis not present

## 2014-12-22 DIAGNOSIS — J449 Chronic obstructive pulmonary disease, unspecified: Secondary | ICD-10-CM | POA: Insufficient documentation

## 2014-12-22 DIAGNOSIS — K219 Gastro-esophageal reflux disease without esophagitis: Secondary | ICD-10-CM | POA: Diagnosis not present

## 2014-12-22 DIAGNOSIS — S12600D Unspecified displaced fracture of seventh cervical vertebra, subsequent encounter for fracture with routine healing: Secondary | ICD-10-CM | POA: Diagnosis not present

## 2014-12-22 DIAGNOSIS — M549 Dorsalgia, unspecified: Secondary | ICD-10-CM | POA: Insufficient documentation

## 2014-12-22 DIAGNOSIS — H5442 Blindness, left eye, normal vision right eye: Secondary | ICD-10-CM | POA: Diagnosis not present

## 2014-12-22 DIAGNOSIS — Z87311 Personal history of (healed) other pathological fracture: Secondary | ICD-10-CM | POA: Insufficient documentation

## 2014-12-22 DIAGNOSIS — M5489 Other dorsalgia: Secondary | ICD-10-CM

## 2014-12-22 DIAGNOSIS — Z862 Personal history of diseases of the blood and blood-forming organs and certain disorders involving the immune mechanism: Secondary | ICD-10-CM | POA: Diagnosis not present

## 2014-12-22 DIAGNOSIS — Z8781 Personal history of (healed) traumatic fracture: Secondary | ICD-10-CM | POA: Insufficient documentation

## 2014-12-22 DIAGNOSIS — M546 Pain in thoracic spine: Secondary | ICD-10-CM | POA: Diagnosis not present

## 2014-12-22 MED ORDER — HYDROCODONE-ACETAMINOPHEN 5-325 MG PO TABS
2.0000 | ORAL_TABLET | Freq: Once | ORAL | Status: AC
  Administered 2014-12-22: 2 via ORAL
  Filled 2014-12-22: qty 2

## 2014-12-22 NOTE — ED Notes (Signed)
Pt placed in gown and in bed. Pt monitored by pulse ox, bp cuff, and 5-lead. 

## 2014-12-22 NOTE — ED Notes (Signed)
Daughter stated, she had a horse to back a couple of days ago and the pain is so bad I couldn't wait to see the nurologist on aug. 2 for a previous broken neck.

## 2014-12-22 NOTE — ED Provider Notes (Signed)
CSN: [id number]     Arrival date & time 12/22/14  1155 History   First MD Initiated Contact with Patient 12/22/14 1322     Chief Complaint  Patient presents with  . Back Pain     (Consider location/radiation/quality/duration/timing/severity/associated sxs/prior Treatment) HPI [name] is a 74 y.o. female with multiple medical problems, recent C-spine fracture 2 months ago comes in for evaluation of neck pain. Patient states she has a follow-up appointment with her neurologist on August 2 for reevaluation and CT of cervical spine. Patient reports 2 days ago they were in the past year feeding cows when one of the horses backed into the patient causing her back and neck to jolt suddenly. She reports gradually worsening pain throughout her shoulders and lower neck over the past 2 days. She denies any numbness or weakness, fevers or chills. She has taken 2 Vicodin today without relief of her symptoms. Rates discomfort as severe.  Past Medical History  Diagnosis Date  . Hiatal hernia   . Anemia   . GERD (gastroesophageal reflux disease)   . Hyperlipidemia   . Depression     major  . Stroke     "mini stroke"  . Osteoarthritis   . Reactive airway disease   . B12 deficiency   . Osteoporosis   . Rupture of bowel   . Bladder incontinence   . DDD (degenerative disc disease), lumbar   . Cataract   . COPD (chronic obstructive pulmonary disease)   . Hypertension   . Stroke   . Macular degeneration   . Blind left eye   . Cervical spine fracture   . Compression fracture     C7,  upper T spine -compression fx   Past Surgical History  Procedure Laterality Date  . Abdominal surgery    . Colon surgery    . Joint replacement    . Appendectomy    . Bladder surgery    . Rectocele repair    . Knee surgery    . Cholecystectomy    . Abdominal hysterectomy    . Ostomy    . Eye surgery      cataracts   Family History  Problem Relation Age of Onset  . Heart failure Mother   .  Heart attack Father    History  Substance Use Topics  . Smoking status: Former Smoker -- 1.50 packs/day for 30 years  . Smokeless tobacco: Not on file     Comment: quit 2013  . Alcohol Use: No   OB History    Gravida Para Term Preterm AB TAB SAB Ectopic Multiple Living   0 0 0 0 0 0 0 0       Review of Systems A 10 point review of systems was completed and was negative except for pertinent positives and negatives as mentioned in the history of present illness     Allergies  Ampicillin; Oxycodone-acetaminophen; Toradol; Ambien; Ampicillin-sulbactam sodium; Oxycontin; Tape; Toradol; Tramadol; Cephalexin; Latex; Percocet; Sulfamethoxazole-trimethoprim; and Tramadol  Home Medications   Prior to Admission medications   Medication Sig Start Date End Date Taking? Authorizing Provider  acetaminophen (TYLENOL) 500 MG tablet Take 500 mg by mouth every 6 (six) hours as needed for fever.    Historical Provider, MD  ADVAIR DISKUS 250-50 MCG/DOSE AEPB Inhale 1 capsule into the lungs daily. 09/22/14   Historical Provider, MD  albuterol (ACCUNEB) 0.63 MG/3ML nebulizer solution Take 1 ampule by nebulization every 6 (six) hours as needed for wheezing.  Historical Provider, MD  albuterol (PROVENTIL HFA;VENTOLIN HFA) 108 (90 BASE) MCG/ACT inhaler Inhale 1 puff into the lungs every 6 (six) hours as needed for wheezing or shortness of breath.    Historical Provider, MD  albuterol (PROVENTIL) (2.5 MG/3ML) 0.083% nebulizer solution Take 2.5 mg by nebulization every 6 (six) hours as needed for wheezing or shortness of breath.    Historical Provider, MD  ALPRAZolam Duanne Moron) 1 MG tablet Take 1 mg by mouth at bedtime.    Historical Provider, MD  ALPRAZolam Duanne Moron) 1 MG tablet Take 0.5-1 mg by mouth 2 (two) times daily as needed. Take 1 mg at bedtime as needed for sleep. May take 0.5 mg daily as needed for anxiety. 09/08/14   Historical Provider, MD  amLODipine (NORVASC) 5 MG tablet Take 2.5 mg by mouth 2 (two)  times daily.  04/06/13   Historical Provider, MD  amLODipine (NORVASC) 5 MG tablet Take 5 mg by mouth daily. 09/22/14   Historical Provider, MD  citalopram (CELEXA) 40 MG tablet Take 20 mg by mouth daily. Patient take a half of tab daily    Historical Provider, MD  citalopram (CELEXA) 40 MG tablet Take 20 mg by mouth every morning. 08/26/14   Historical Provider, MD  dicyclomine (BENTYL) 20 MG tablet Take 20 mg by mouth daily as needed for spasms.     Historical Provider, MD  diphenhydrAMINE (BENADRYL) 25 MG tablet Take 25 mg by mouth every 6 (six) hours as needed for itching.    Historical Provider, MD  DOCUSATE SODIUM PO Take 1 capsule by mouth daily.    Historical Provider, MD  doxycycline (VIBRAMYCIN) 100 MG capsule Take 1 capsule (100 mg total) by mouth 2 (two) times daily. 10/10/14   Verlee Monte, MD  esomeprazole (NEXIUM) 40 MG capsule Take 40 mg by mouth 2 (two) times daily.    Historical Provider, MD  gabapentin (NEURONTIN) 300 MG capsule Take 300 mg by mouth 3 (three) times daily. 09/20/14   Historical Provider, MD  gabapentin (NEURONTIN) 300 MG capsule Take 300 mg by mouth 3 (three) times daily.    Historical Provider, MD  HYDROcodone-acetaminophen (NORCO/VICODIN) 5-325 MG per tablet Take 1-2 tablets by mouth 3 (three) times daily as needed for moderate pain or severe pain.  08/26/14   Historical Provider, MD  HYDROcodone-acetaminophen (VICODIN) 5-500 MG per tablet Take 1 tablet by mouth every 6 (six) hours as needed. For pain    Historical Provider, MD  lovastatin (MEVACOR) 10 MG tablet Take 10 mg by mouth every morning.     Historical Provider, MD  lovastatin (MEVACOR) 10 MG tablet Take 10 mg by mouth daily. 09/22/14   Historical Provider, MD  meclizine (ANTIVERT) 12.5 MG tablet Take 12.5 mg by mouth 3 (three) times daily as needed for nausea.    Historical Provider, MD  meclizine (ANTIVERT) 25 MG tablet Take 25 mg by mouth 3 (three) times daily as needed for dizziness.  08/26/14   Historical  Provider, MD  meloxicam (MOBIC) 7.5 MG tablet Take 7.5 mg by mouth daily.    Historical Provider, MD  NEXIUM 40 MG capsule Take 40 mg by mouth 2 (two) times daily before a meal.  08/26/14   Historical Provider, MD  nystatin (MYCOSTATIN) 100000 UNIT/ML suspension Take 5 mLs by mouth as directed. Swish and spit. 08/02/14   Historical Provider, MD  oxyCODONE-acetaminophen (PERCOCET/ROXICET) 5-325 MG per tablet Take 1 tablet by mouth every 6 (six) hours as needed for severe pain. 09/22/14   Loma Sousa  F Horton, MD  polyethylene glycol powder (GLYCOLAX/MIRALAX) powder Take 17 g by mouth daily. For constipation    Historical Provider, MD  polyethylene glycol powder (GLYCOLAX/MIRALAX) powder Take 17 g by mouth daily. 07/12/14   Historical Provider, MD  promethazine (PHENERGAN) 12.5 MG suppository Place 12.5 mg rectally every 6 (six) hours as needed for nausea or vomiting.    Historical Provider, MD  PROMETHEGAN 12.5 MG suppository Place 1 suppository rectally daily as needed for nausea or vomiting.  07/16/14   Historical Provider, MD  senna (SENOKOT) 8.6 MG tablet Take 1-2 tablets by mouth daily.     Historical Provider, MD  sennosides-docusate sodium (SENOKOT-S) 8.6-50 MG tablet Take 1 tablet by mouth daily.    Historical Provider, MD  Soft Lens Products (RA REWETTING DROPS) SOLN 1 application by Does not apply route daily.    Historical Provider, MD  tiZANidine (ZANAFLEX) 4 MG capsule Take 2 mg by mouth at bedtime.     Historical Provider, MD  tiZANidine (ZANAFLEX) 4 MG tablet Take 2 mg by mouth at bedtime. 08/31/14   Historical Provider, MD  traZODone (DESYREL) 50 MG tablet Take 0.5 tablets (25 mg total) by mouth at bedtime. 02/23/14   Philmore Pali, NP  traZODone (DESYREL) 50 MG tablet Take 50 mg by mouth at bedtime. 08/26/14   Historical Provider, MD  VENTOLIN HFA 108 (90 BASE) MCG/ACT inhaler Take 2 puffs by mouth every 6 (six) hours as needed. 09/22/14   Historical Provider, MD  vitamin B-12 (CYANOCOBALAMIN) 1000 MCG  tablet Take 1,000 mcg by mouth daily.    Historical Provider, MD  vitamin B-12 (CYANOCOBALAMIN) 1000 MCG tablet Take 1,000 mcg by mouth daily.    Historical Provider, MD   BP 145/64 mmHg  Pulse 68  Temp(Src) 98.3 F (36.8 C) (Oral)  Resp 19  Ht '5\' 5"'$  (1.651 m)  Wt 185 lb (83.915 kg)  BMI 30.79 kg/m2  SpO2 97% Physical Exam  Constitutional: She is oriented to person, place, and time. She appears well-developed and well-nourished.  HENT:  Head: Normocephalic and atraumatic.  Mouth/Throat: Oropharynx is clear and moist.  Mildly dry mucous membranes  Eyes: Conjunctivae are normal. Pupils are equal, round, and reactive to light. Right eye exhibits no discharge. Left eye exhibits no discharge. No scleral icterus.  Neck: Neck supple.  Patient in Advertising account planner. Continues to rotate head and look around the room despite instructions to be still.  Cardiovascular: Normal rate, regular rhythm and normal heart sounds.   Pulmonary/Chest: Effort normal and breath sounds normal. No respiratory distress. She has no wheezes. She has no rales.  Abdominal: Soft. There is no tenderness.  Musculoskeletal: She exhibits no tenderness.  Neurological: She is alert and oriented to person, place, and time.  Cranial Nerves II-XII grossly intact. Motor and sensation 5/5 in all 4 extremities. No focal neurodeficits.  Skin: Skin is warm and dry. No rash noted.  Psychiatric: She has a normal mood and affect.  Nursing note and vitals reviewed.   ED Course  Procedures (including critical care time) Labs Review Labs Reviewed - No data to display  Imaging Review Ct Cervical Spine Wo Contrast  12/22/2014   CLINICAL DATA:  Recent mild acute compression fracture of C7 10/05/2014. Hit by a horse yesterday, worsening cervical neck pain.  EXAM: CT CERVICAL SPINE WITHOUT CONTRAST  TECHNIQUE: Multidetector CT imaging of the cervical spine was performed without intravenous contrast. Multiplanar CT image reconstructions  were also generated.  COMPARISON:  10/05/2014  FINDINGS:  Stable cervical spine alignment and diffuse degenerative changes. Facets are aligned. Diffuse facet arthropathy at multiple levels. No Subluxation or dislocation. Degenerative changes of the C1-2 articulation. Normal prevertebral soft tissues. Compared to the prior study, there is stable mild compression fracture of the C7 vertebral body. C7 vertebral body demonstrates increased sclerosis compatible with interval healing. This is best appreciated on the sagittal reconstructions. No new fracture demonstrated.  Lung apices are clear. No soft tissue asymmetry in the neck. Carotid calcifications noted.  IMPRESSION: Stable mild compression fracture of C7 with evidence of increased sclerosis indicative of healing. Fracture lines are less visible.  Stable degenerative changes.  No significant interval change or new fracture evident.   Electronically Signed   By: Jerilynn Mages.  Shick M.D.   On: 12/22/2014 15:20     EKG Interpretation None     Meds given in ED:  Medications  HYDROcodone-acetaminophen (NORCO/VICODIN) 5-325 MG per tablet 2 tablet (2 tablets Oral Given 12/22/14 1553)    New Prescriptions   No medications on file   Filed Vitals:   12/22/14 1430 12/22/14 1515 12/22/14 1552 12/22/14 1600  BP: 135/74 148/69 135/59 145/64  Pulse: 59 63 66 68  Temp:      TempSrc:      Resp: '18 17 19 19  '$ Height:      Weight:      SpO2: 95% 95% 96% 97%    MDM  Vitals stable - WNL -afebrile Pt resting comfortably in ED. States she feels well and is ready to go home. PE--normal neuro exam. Physical exam is otherwise not concerning. Imaging--CT cervical spine shows stable mild compression fracture C7 that is healing well. No other interval change or new fracture evident.  Patient is stable for discharge to follow-up with her neurologist for regularly scheduled appointment for further evaluation and management of symptoms. Discussed taking appropriate safety  measures at home and avoiding farm animals and pastures.  I discussed all relevant lab findings and imaging results with pt and they verbalized understanding. Discussed f/u with PCP within 48 hrs and return precautions, pt very amenable to plan.  Final diagnoses:  Other back pain  Neck discomfort        Comer Locket, PA-C 12/22/14 1607  Charlesetta Shanks, MD 01/02/15 1434

## 2014-12-22 NOTE — Discharge Instructions (Signed)
It is important for you to follow-up with your doctors for further evaluation and management of your symptoms. Continue wearing her brace as directed. Please avoid farm animals and horse pastures until cleared by her doctors. Return to ED for worsening symptoms.

## 2014-12-27 ENCOUNTER — Inpatient Hospital Stay: Admission: RE | Admit: 2014-12-27 | Payer: Medicare Other | Source: Ambulatory Visit

## 2014-12-27 DIAGNOSIS — M542 Cervicalgia: Secondary | ICD-10-CM | POA: Diagnosis not present

## 2014-12-30 ENCOUNTER — Other Ambulatory Visit: Payer: Self-pay

## 2014-12-30 MED ORDER — TRAZODONE HCL 50 MG PO TABS: 25.0000 mg | ORAL_TABLET | Freq: Every day | ORAL | Status: DC

## 2015-01-09 ENCOUNTER — Emergency Department (HOSPITAL_COMMUNITY)
Admission: EM | Admit: 2015-01-09 | Discharge: 2015-01-09 | Disposition: A | Discharge status: Home / Self Care | Payer: Medicare Other | Attending: Emergency Medicine | Admitting: Emergency Medicine

## 2015-01-09 ENCOUNTER — Encounter (HOSPITAL_COMMUNITY): Payer: Self-pay | Admitting: Emergency Medicine

## 2015-01-09 DIAGNOSIS — J449 Chronic obstructive pulmonary disease, unspecified: Secondary | ICD-10-CM | POA: Diagnosis not present

## 2015-01-09 DIAGNOSIS — K219 Gastro-esophageal reflux disease without esophagitis: Secondary | ICD-10-CM | POA: Insufficient documentation

## 2015-01-09 DIAGNOSIS — M1990 Unspecified osteoarthritis, unspecified site: Secondary | ICD-10-CM | POA: Diagnosis not present

## 2015-01-09 DIAGNOSIS — E538 Deficiency of other specified B group vitamins: Secondary | ICD-10-CM | POA: Insufficient documentation

## 2015-01-09 DIAGNOSIS — F329 Major depressive disorder, single episode, unspecified: Secondary | ICD-10-CM | POA: Diagnosis not present

## 2015-01-09 DIAGNOSIS — Z8781 Personal history of (healed) traumatic fracture: Secondary | ICD-10-CM | POA: Diagnosis not present

## 2015-01-09 DIAGNOSIS — Z8673 Personal history of transient ischemic attack (TIA), and cerebral infarction without residual deficits: Secondary | ICD-10-CM | POA: Insufficient documentation

## 2015-01-09 DIAGNOSIS — H5442 Blindness, left eye, normal vision right eye: Secondary | ICD-10-CM | POA: Diagnosis not present

## 2015-01-09 DIAGNOSIS — T7840XA Allergy, unspecified, initial encounter: Secondary | ICD-10-CM | POA: Diagnosis not present

## 2015-01-09 DIAGNOSIS — Z79899 Other long term (current) drug therapy: Secondary | ICD-10-CM | POA: Diagnosis not present

## 2015-01-09 DIAGNOSIS — I10 Essential (primary) hypertension: Secondary | ICD-10-CM | POA: Insufficient documentation

## 2015-01-09 DIAGNOSIS — R21 Rash and other nonspecific skin eruption: Secondary | ICD-10-CM | POA: Diagnosis present

## 2015-01-09 DIAGNOSIS — Z862 Personal history of diseases of the blood and blood-forming organs and certain disorders involving the immune mechanism: Secondary | ICD-10-CM | POA: Diagnosis not present

## 2015-01-09 DIAGNOSIS — Z91040 Latex allergy status: Secondary | ICD-10-CM | POA: Insufficient documentation

## 2015-01-09 DIAGNOSIS — L259 Unspecified contact dermatitis, unspecified cause: Secondary | ICD-10-CM | POA: Diagnosis not present

## 2015-01-09 DIAGNOSIS — Z87891 Personal history of nicotine dependence: Secondary | ICD-10-CM | POA: Insufficient documentation

## 2015-01-09 DIAGNOSIS — L299 Pruritus, unspecified: Secondary | ICD-10-CM | POA: Diagnosis not present

## 2015-01-09 DIAGNOSIS — E785 Hyperlipidemia, unspecified: Secondary | ICD-10-CM | POA: Insufficient documentation

## 2015-01-09 MED ORDER — FAMOTIDINE 20 MG PO TABS
20.0000 mg | ORAL_TABLET | Freq: Once | ORAL | Status: AC
  Administered 2015-01-09: 20 mg via ORAL
  Filled 2015-01-09: qty 1

## 2015-01-09 MED ORDER — FAMOTIDINE 20 MG PO TABS: 20.0000 mg | ORAL_TABLET | Freq: Two times a day (BID) | ORAL | Status: DC

## 2015-01-09 MED ORDER — DIPHENHYDRAMINE HCL 25 MG PO TABS: 25.0000 mg | ORAL_TABLET | Freq: Four times a day (QID) | ORAL | Status: DC

## 2015-01-09 MED ORDER — PREDNISONE 10 MG PO TABS: ORAL_TABLET | ORAL | Status: DC

## 2015-01-09 MED ORDER — PREDNISONE 20 MG PO TABS
60.0000 mg | ORAL_TABLET | Freq: Once | ORAL | Status: AC
  Administered 2015-01-09: 60 mg via ORAL
  Filled 2015-01-09: qty 3

## 2015-01-09 MED ORDER — DIPHENHYDRAMINE HCL 25 MG PO CAPS
50.0000 mg | ORAL_CAPSULE | Freq: Once | ORAL | Status: AC
  Administered 2015-01-09: 50 mg via ORAL
  Filled 2015-01-09 (×2): qty 2

## 2015-01-09 NOTE — Discharge Instructions (Signed)

## 2015-01-09 NOTE — ED Provider Notes (Addendum)
CSN: [id number]     Arrival date & time 01/09/15  0431 History   First MD Initiated Contact with Patient 01/09/15 [phone]     Chief Complaint  Patient presents with  . Rash     HPI Patient reports diffuse generalized rash developing over the past 24 hours.  She reports began around her umbilicus and is now spread to her extremities, chest, remainder of her abdomen and her back.  No fevers or chills.  No chest pain or shortness of breath.  She's tried Benadryl for the diffuse itching without improvement in her symptoms.  No new medications.  She denies recent contact with possible poison ivy.  She states that she has changed fabric softener.   Past Medical History  Diagnosis Date  . Hiatal hernia   . Anemia   . GERD (gastroesophageal reflux disease)   . Hyperlipidemia   . Depression     major  . Stroke     "mini stroke"  . Osteoarthritis   . Reactive airway disease   . B12 deficiency   . Osteoporosis   . Rupture of bowel   . Bladder incontinence   . DDD (degenerative disc disease), lumbar   . Cataract   . COPD (chronic obstructive pulmonary disease)   . Hypertension   . Stroke   . Macular degeneration   . Blind left eye   . Cervical spine fracture   . Compression fracture     C7,  upper T spine -compression fx   Past Surgical History  Procedure Laterality Date  . Abdominal surgery    . Colon surgery    . Joint replacement    . Appendectomy    . Bladder surgery    . Rectocele repair    . Knee surgery    . Cholecystectomy    . Abdominal hysterectomy    . Ostomy    . Eye surgery      cataracts   Family History  Problem Relation Age of Onset  . Heart failure Mother   . Heart attack Father    Social History  Substance Use Topics  . Smoking status: Former Smoker -- 1.50 packs/day for 30 years  . Smokeless tobacco: None     Comment: quit 2013  . Alcohol Use: No   OB History    Gravida Para Term Preterm AB TAB SAB Ectopic Multiple Living   0 0 0 0 0 0 0 0        Review of Systems  All other systems reviewed and are negative.     Allergies  Ampicillin; Oxycodone-acetaminophen; Toradol; Ambien; Ampicillin-sulbactam sodium; Oxycontin; Tape; Toradol; Tramadol; Cephalexin; Latex; Percocet; Sulfamethoxazole-trimethoprim; and Tramadol  Home Medications   Prior to Admission medications   Medication Sig Start Date End Date Taking? Authorizing Provider  acetaminophen (TYLENOL) 500 MG tablet Take 500 mg by mouth every 6 (six) hours as needed for fever.    Historical Provider, MD  ADVAIR DISKUS 250-50 MCG/DOSE AEPB Inhale 1 capsule into the lungs daily. 09/22/14   Historical Provider, MD  albuterol (ACCUNEB) 0.63 MG/3ML nebulizer solution Take 1 ampule by nebulization every 6 (six) hours as needed for wheezing.    Historical Provider, MD  albuterol (PROVENTIL HFA;VENTOLIN HFA) 108 (90 BASE) MCG/ACT inhaler Inhale 1 puff into the lungs every 6 (six) hours as needed for wheezing or shortness of breath.    Historical Provider, MD  albuterol (PROVENTIL) (2.5 MG/3ML) 0.083% nebulizer solution Take 2.5 mg by nebulization every 6 (six)  hours as needed for wheezing or shortness of breath.    Historical Provider, MD  ALPRAZolam Duanne Moron) 1 MG tablet Take 1 mg by mouth at bedtime.    Historical Provider, MD  ALPRAZolam Duanne Moron) 1 MG tablet Take 0.5-1 mg by mouth 2 (two) times daily as needed. Take 1 mg at bedtime as needed for sleep. May take 0.5 mg daily as needed for anxiety. 09/08/14   Historical Provider, MD  amLODipine (NORVASC) 5 MG tablet Take 2.5 mg by mouth 2 (two) times daily.  04/06/13   Historical Provider, MD  amLODipine (NORVASC) 5 MG tablet Take 5 mg by mouth daily. 09/22/14   Historical Provider, MD  citalopram (CELEXA) 40 MG tablet Take 20 mg by mouth daily. Patient take a half of tab daily    Historical Provider, MD  citalopram (CELEXA) 40 MG tablet Take 20 mg by mouth every morning. 08/26/14   Historical Provider, MD  dicyclomine (BENTYL) 20 MG tablet Take  20 mg by mouth daily as needed for spasms.     Historical Provider, MD  diphenhydrAMINE (BENADRYL) 25 MG tablet Take 1 tablet (25 mg total) by mouth every 6 (six) hours. 01/09/15   Jola Schmidt, MD  DOCUSATE SODIUM PO Take 1 capsule by mouth daily.    Historical Provider, MD  doxycycline (VIBRAMYCIN) 100 MG capsule Take 1 capsule (100 mg total) by mouth 2 (two) times daily. 10/10/14   Verlee Monte, MD  esomeprazole (NEXIUM) 40 MG capsule Take 40 mg by mouth 2 (two) times daily.    Historical Provider, MD  famotidine (PEPCID) 20 MG tablet Take 1 tablet (20 mg total) by mouth 2 (two) times daily. 01/09/15   Jola Schmidt, MD  gabapentin (NEURONTIN) 300 MG capsule Take 300 mg by mouth 3 (three) times daily. 09/20/14   Historical Provider, MD  gabapentin (NEURONTIN) 300 MG capsule Take 300 mg by mouth 3 (three) times daily.    Historical Provider, MD  HYDROcodone-acetaminophen (NORCO/VICODIN) 5-325 MG per tablet Take 1-2 tablets by mouth 3 (three) times daily as needed for moderate pain or severe pain.  08/26/14   Historical Provider, MD  HYDROcodone-acetaminophen (VICODIN) 5-500 MG per tablet Take 1 tablet by mouth every 6 (six) hours as needed. For pain    Historical Provider, MD  lovastatin (MEVACOR) 10 MG tablet Take 10 mg by mouth every morning.     Historical Provider, MD  lovastatin (MEVACOR) 10 MG tablet Take 10 mg by mouth daily. 09/22/14   Historical Provider, MD  meclizine (ANTIVERT) 12.5 MG tablet Take 12.5 mg by mouth 3 (three) times daily as needed for nausea.    Historical Provider, MD  meclizine (ANTIVERT) 25 MG tablet Take 25 mg by mouth 3 (three) times daily as needed for dizziness.  08/26/14   Historical Provider, MD  meloxicam (MOBIC) 7.5 MG tablet Take 7.5 mg by mouth daily.    Historical Provider, MD  NEXIUM 40 MG capsule Take 40 mg by mouth 2 (two) times daily before a meal.  08/26/14   Historical Provider, MD  nystatin (MYCOSTATIN) 100000 UNIT/ML suspension Take 5 mLs by mouth as directed.  Swish and spit. 08/02/14   Historical Provider, MD  oxyCODONE-acetaminophen (PERCOCET/ROXICET) 5-325 MG per tablet Take 1 tablet by mouth every 6 (six) hours as needed for severe pain. 09/22/14   Merryl Hacker, MD  polyethylene glycol powder (GLYCOLAX/MIRALAX) powder Take 17 g by mouth daily. For constipation    Historical Provider, MD  polyethylene glycol powder (GLYCOLAX/MIRALAX) powder Take  17 g by mouth daily. 07/12/14   Historical Provider, MD  predniSONE (DELTASONE) 10 MG tablet '60mg'$  PO x 3 days, 40 mg PO x 3 days, 20 mg PO x 2 days, 10 mg PO 2 days 01/09/15   Jola Schmidt, MD  promethazine (PHENERGAN) 12.5 MG suppository Place 12.5 mg rectally every 6 (six) hours as needed for nausea or vomiting.    Historical Provider, MD  PROMETHEGAN 12.5 MG suppository Place 1 suppository rectally daily as needed for nausea or vomiting.  07/16/14   Historical Provider, MD  senna (SENOKOT) 8.6 MG tablet Take 1-2 tablets by mouth daily.     Historical Provider, MD  sennosides-docusate sodium (SENOKOT-S) 8.6-50 MG tablet Take 1 tablet by mouth daily.    Historical Provider, MD  Soft Lens Products (RA REWETTING DROPS) SOLN 1 application by Does not apply route daily.    Historical Provider, MD  tiZANidine (ZANAFLEX) 4 MG capsule Take 2 mg by mouth at bedtime.     Historical Provider, MD  tiZANidine (ZANAFLEX) 4 MG tablet Take 2 mg by mouth at bedtime. 08/31/14   Historical Provider, MD  traZODone (DESYREL) 50 MG tablet Take 0.5 tablets (25 mg total) by mouth at bedtime. 12/30/14   Penni Bombard, MD  VENTOLIN HFA 108 (90 BASE) MCG/ACT inhaler Take 2 puffs by mouth every 6 (six) hours as needed. 09/22/14   Historical Provider, MD  vitamin B-12 (CYANOCOBALAMIN) 1000 MCG tablet Take 1,000 mcg by mouth daily.    Historical Provider, MD  vitamin B-12 (CYANOCOBALAMIN) 1000 MCG tablet Take 1,000 mcg by mouth daily.    Historical Provider, MD   BP 176/91 mmHg  Pulse 81  Temp(Src) 98 F (36.7 C) (Oral)  Resp 20  SpO2  100% Physical Exam  Constitutional: She is oriented to person, place, and time. She appears well-developed and well-nourished. No distress.  HENT:  Head: Normocephalic and atraumatic.  Eyes: EOM are normal.  Neck: Normal range of motion.  Cardiovascular: Normal rate, regular rhythm and normal heart sounds.   Pulmonary/Chest: Effort normal and breath sounds normal.  Abdominal: Soft. She exhibits no distension. There is no tenderness.  Musculoskeletal: Normal range of motion.  Neurological: She is alert and oriented to person, place, and time.  Skin: Skin is warm and dry.  Diffuse macular rash with signs of excoriation across her arms, chest, back, abdomen  Psychiatric: She has a normal mood and affect. Judgment normal.  Nursing note and vitals reviewed.   ED Course  Procedures (including critical care time) Labs Review Labs Reviewed - No data to display  Imaging Review No results found.    EKG Interpretation None      MDM   Final diagnoses:  Contact dermatitis    Likely contact dermatitis.  Given the diffuse nature this patient be placed on 10 day course of steroids in addition to Pepcid and Benadryl.  She understands return to the ER for new or worsening symptoms.    Jola Schmidt, MD 01/09/15 Justice, MD 01/09/15 [phone]

## 2015-01-09 NOTE — ED Notes (Signed)
Pt to ED via [facility] for generalized rash x 1 day.  States it started at umbilicus and then spread all over.

## 2015-01-10 DIAGNOSIS — L309 Dermatitis, unspecified: Secondary | ICD-10-CM | POA: Diagnosis not present

## 2015-01-17 DIAGNOSIS — E538 Deficiency of other specified B group vitamins: Secondary | ICD-10-CM | POA: Diagnosis not present

## 2015-01-17 DIAGNOSIS — M810 Age-related osteoporosis without current pathological fracture: Secondary | ICD-10-CM | POA: Diagnosis not present

## 2015-01-17 DIAGNOSIS — I10 Essential (primary) hypertension: Secondary | ICD-10-CM | POA: Diagnosis not present

## 2015-01-17 DIAGNOSIS — K219 Gastro-esophageal reflux disease without esophagitis: Secondary | ICD-10-CM | POA: Diagnosis not present

## 2015-01-17 DIAGNOSIS — R5383 Other fatigue: Secondary | ICD-10-CM | POA: Diagnosis not present

## 2015-01-17 DIAGNOSIS — E780 Pure hypercholesterolemia: Secondary | ICD-10-CM | POA: Diagnosis not present

## 2015-01-17 DIAGNOSIS — G8929 Other chronic pain: Secondary | ICD-10-CM | POA: Diagnosis not present

## 2015-01-17 DIAGNOSIS — F324 Major depressive disorder, single episode, in partial remission: Secondary | ICD-10-CM | POA: Diagnosis not present

## 2015-01-17 DIAGNOSIS — N183 Chronic kidney disease, stage 3 (moderate): Secondary | ICD-10-CM | POA: Diagnosis not present

## 2015-01-17 DIAGNOSIS — T148 Other injury of unspecified body region: Secondary | ICD-10-CM | POA: Diagnosis not present

## 2015-01-17 DIAGNOSIS — M542 Cervicalgia: Secondary | ICD-10-CM | POA: Diagnosis not present

## 2015-01-26 ENCOUNTER — Emergency Department (HOSPITAL_COMMUNITY)
Admission: EM | Admit: 2015-01-26 | Discharge: 2015-01-26 | Disposition: A | Discharge status: Home / Self Care | Payer: Medicare Other | Attending: Emergency Medicine | Admitting: Emergency Medicine

## 2015-01-26 ENCOUNTER — Emergency Department (HOSPITAL_COMMUNITY): Payer: Medicare Other

## 2015-01-26 ENCOUNTER — Encounter (HOSPITAL_COMMUNITY): Payer: Self-pay | Admitting: Emergency Medicine

## 2015-01-26 DIAGNOSIS — S22038A Other fracture of third thoracic vertebra, initial encounter for closed fracture: Secondary | ICD-10-CM | POA: Diagnosis not present

## 2015-01-26 DIAGNOSIS — T148 Other injury of unspecified body region: Secondary | ICD-10-CM | POA: Diagnosis not present

## 2015-01-26 DIAGNOSIS — Z79899 Other long term (current) drug therapy: Secondary | ICD-10-CM | POA: Diagnosis not present

## 2015-01-26 DIAGNOSIS — K219 Gastro-esophageal reflux disease without esophagitis: Secondary | ICD-10-CM | POA: Insufficient documentation

## 2015-01-26 DIAGNOSIS — J449 Chronic obstructive pulmonary disease, unspecified: Secondary | ICD-10-CM | POA: Diagnosis not present

## 2015-01-26 DIAGNOSIS — W228XXA Striking against or struck by other objects, initial encounter: Secondary | ICD-10-CM | POA: Diagnosis not present

## 2015-01-26 DIAGNOSIS — M542 Cervicalgia: Secondary | ICD-10-CM | POA: Diagnosis not present

## 2015-01-26 DIAGNOSIS — E785 Hyperlipidemia, unspecified: Secondary | ICD-10-CM | POA: Diagnosis not present

## 2015-01-26 DIAGNOSIS — Y998 Other external cause status: Secondary | ICD-10-CM | POA: Insufficient documentation

## 2015-01-26 DIAGNOSIS — Y9389 Activity, other specified: Secondary | ICD-10-CM | POA: Diagnosis not present

## 2015-01-26 DIAGNOSIS — Y9289 Other specified places as the place of occurrence of the external cause: Secondary | ICD-10-CM | POA: Diagnosis not present

## 2015-01-26 DIAGNOSIS — I10 Essential (primary) hypertension: Secondary | ICD-10-CM | POA: Diagnosis not present

## 2015-01-26 DIAGNOSIS — E538 Deficiency of other specified B group vitamins: Secondary | ICD-10-CM | POA: Diagnosis not present

## 2015-01-26 DIAGNOSIS — S199XXA Unspecified injury of neck, initial encounter: Secondary | ICD-10-CM | POA: Diagnosis present

## 2015-01-26 DIAGNOSIS — F329 Major depressive disorder, single episode, unspecified: Secondary | ICD-10-CM | POA: Insufficient documentation

## 2015-01-26 DIAGNOSIS — S22039A Unspecified fracture of third thoracic vertebra, initial encounter for closed fracture: Secondary | ICD-10-CM | POA: Diagnosis not present

## 2015-01-26 DIAGNOSIS — M1990 Unspecified osteoarthritis, unspecified site: Secondary | ICD-10-CM | POA: Insufficient documentation

## 2015-01-26 DIAGNOSIS — H5442 Blindness, left eye, normal vision right eye: Secondary | ICD-10-CM | POA: Insufficient documentation

## 2015-01-26 DIAGNOSIS — S22029A Unspecified fracture of second thoracic vertebra, initial encounter for closed fracture: Secondary | ICD-10-CM | POA: Diagnosis not present

## 2015-01-26 DIAGNOSIS — Z862 Personal history of diseases of the blood and blood-forming organs and certain disorders involving the immune mechanism: Secondary | ICD-10-CM | POA: Diagnosis not present

## 2015-01-26 DIAGNOSIS — Z8673 Personal history of transient ischemic attack (TIA), and cerebral infarction without residual deficits: Secondary | ICD-10-CM | POA: Insufficient documentation

## 2015-01-26 DIAGNOSIS — M549 Dorsalgia, unspecified: Secondary | ICD-10-CM | POA: Diagnosis not present

## 2015-01-26 DIAGNOSIS — Z87891 Personal history of nicotine dependence: Secondary | ICD-10-CM | POA: Insufficient documentation

## 2015-01-26 DIAGNOSIS — S22028A Other fracture of second thoracic vertebra, initial encounter for closed fracture: Secondary | ICD-10-CM | POA: Diagnosis not present

## 2015-01-26 DIAGNOSIS — Z91040 Latex allergy status: Secondary | ICD-10-CM | POA: Diagnosis not present

## 2015-01-26 DIAGNOSIS — S22009A Unspecified fracture of unspecified thoracic vertebra, initial encounter for closed fracture: Secondary | ICD-10-CM

## 2015-01-26 MED ORDER — DIAZEPAM 5 MG PO TABS
5.0000 mg | ORAL_TABLET | Freq: Once | ORAL | Status: AC
  Administered 2015-01-26: 5 mg via ORAL
  Filled 2015-01-26: qty 1

## 2015-01-26 MED ORDER — HYDROMORPHONE HCL 1 MG/ML IJ SOLN
1.0000 mg | Freq: Once | INTRAMUSCULAR | Status: AC
  Administered 2015-01-26: 1 mg via INTRAMUSCULAR
  Filled 2015-01-26: qty 1

## 2015-01-26 MED ORDER — DIAZEPAM 2 MG PO TABS: 2.0000 mg | ORAL_TABLET | ORAL | Status: DC | PRN

## 2015-01-26 MED ORDER — ONDANSETRON 4 MG PO TBDP
8.0000 mg | ORAL_TABLET | Freq: Once | ORAL | Status: AC
  Administered 2015-01-26: 8 mg via ORAL
  Filled 2015-01-26: qty 2

## 2015-01-26 NOTE — Discharge Instructions (Signed)
Back, Compression Fracture °A compression fracture happens when a force is put upon the length of your spine. Slipping and falling on your bottom are examples of such a force. When this happens, sometimes the force is great enough to compress the building blocks (vertebral bodies) of your spine. Although this causes a lot of pain, this can usually be treated at home, unless your caregiver feels hospitalization is needed for pain control. °Your backbone (spinal column) is made up of 24 main vertebral bodies in addition to the sacrum and coccyx (see illustration). These are held together by tough fibrous tissues (ligaments) and by support of your muscles. Nerve roots pass through the openings between the vertebrae. A sudden wrenching move, injury, or a fall may cause a compression fracture of one of the vertebral bodies. This may result in back pain or spread of pain into the belly (abdomen), the buttocks, and down the leg into the foot. Pain may also be created by muscle spasm alone. °Large studies have been undertaken to determine the best possible course of action to help your back following injury and also to prevent future problems. The recommendations are as follows. °FOLLOWING A COMPRESSION FRACTURE: °Do the following only if advised by your caregiver.  °· If a back brace has been suggested or provided, wear it as directed. °· Do not stop wearing the back brace unless instructed by your caregiver. °· When allowed to return to regular activities, avoid a sedentary lifestyle. Actively exercise. Sporadic weekend binges of tennis, racquetball, or waterskiing may actually aggravate or create problems, especially if you are not in condition for that activity. °· Avoid sports requiring sudden body movements until you are in condition for them. Swimming and walking are safer activities. °· Maintain good posture. °· Avoid obesity. °· If not already done, you should have a DEXA scan. Based on the results, be treated for  osteoporosis. °FOLLOWING ACUTE (SUDDEN) INJURY: °· Only take over-the-counter or prescription medicines for pain, discomfort, or fever as directed by your caregiver. °· Use bed rest for only the most extreme acute episode. Prolonged bed rest may aggravate your condition. Ice used for acute conditions is effective. Use a large plastic bag filled with ice. Wrap it in a towel. This also provides excellent pain relief. This may be continuous. Or use it for 30 minutes every 2 hours during acute phase, then as needed. Heat for 30 minutes prior to activities is helpful. °· As soon as the acute phase (the time when your back is too painful for you to do normal activities) is over, it is important to resume normal activities and work hardening programs. Back injuries can cause potentially marked changes in lifestyle. So it is important to attack these problems aggressively. °· See your caregiver for continued problems. He or she can help or refer you for appropriate exercises, physical therapy, and work hardening if needed. °· If you are given narcotic medications for your condition, for the next 24 hours do not: °¨ Drive. °¨ Operate machinery or power tools. °¨ Sign legal documents. °· Do not drink alcohol, or take sleeping pills or other medications that may interfere with treatment. °If your caregiver has given you a follow-up appointment, it is very important to keep that appointment. Not keeping the appointment could result in a chronic or permanent injury, pain, and disability. If there is any problem keeping the appointment, you must call back to this facility for assistance.  °SEEK IMMEDIATE MEDICAL CARE IF: °· You develop numbness,   tingling, weakness, or problems with the use of your arms or legs. °· You develop severe back pain not relieved with medications. °· You have changes in bowel or bladder control. °· You have increasing pain in any areas of the body. °Document Released: 05/13/2005 Document Revised:  09/27/2013 Document Reviewed: 12/16/2007 °[employer]® Patient Information ©2015 [employer]. This information is not intended to replace advice given to you by your health care provider. Make sure you discuss any questions you have with your health care provider. ° °

## 2015-01-26 NOTE — ED Notes (Signed)
Patient stood up to go to bathroom; very unsteady. Placed on fall risk precautions.

## 2015-01-26 NOTE — ED Notes (Signed)
Patient reported seeing dogs and cats in the room,  This rn went to assess patient and patient was noted to be lethargic and ... Confused.  Patient also had desatted to 87% on room air, so supplemental o2 was placed on patient at 2 liters via [place] which increased her spo2 to 94%.  EDP was notified and she came to assess patient.  She advised that we continue to monitor patient for another half hour and reassess to see if patient is ready for dc after that time as this could be a reaction to the dilaudid that she received earlier.  Patient then fell asleep or passed out onto the siderail, vitals remained stable, airway intact.

## 2015-01-26 NOTE — ED Notes (Signed)
Yesterday patient bent forward to pick up dog and hit head the corner of desk; out of pain she stood up very quickly and bent neck backwards. Neck pain since this event. 4 months ago she had fracture at C7 and is worried she had re-injured this area.

## 2015-01-26 NOTE — ED Provider Notes (Signed)
CSN: [id number]     Arrival date & time 01/26/15  26 History   First MD Initiated Contact with Patient 01/26/15 1306     Chief Complaint  Patient presents with  . Neck Pain     (Consider location/radiation/quality/duration/timing/severity/associated sxs/prior Treatment) HPI Comments: Patient here complaining of upper back and neck pain after a fall yesterday. States that she was bending over to pick up something and struck her forehead and then went backwards and felt a pop in her upper back. No loss of consciousness. Complains of sharp pain worse with movement. Denies any numbness or tingling to her arms or legs. Took 15 mg of oxycodone without relief. No bowel or bladder dysfunction. Denies any other injuries. No recent illnesses.  Patient is a 74 y.o. female presenting with neck pain. The history is provided by the patient and a relative.  Neck Pain   Past Medical History  Diagnosis Date  . Hiatal hernia   . Anemia   . GERD (gastroesophageal reflux disease)   . Hyperlipidemia   . Depression     major  . Stroke     "mini stroke"  . Osteoarthritis   . Reactive airway disease   . B12 deficiency   . Osteoporosis   . Rupture of bowel   . Bladder incontinence   . DDD (degenerative disc disease), lumbar   . Cataract   . COPD (chronic obstructive pulmonary disease)   . Hypertension   . Stroke   . Macular degeneration   . Blind left eye   . Cervical spine fracture   . Compression fracture     C7,  upper T spine -compression fx   Past Surgical History  Procedure Laterality Date  . Abdominal surgery    . Colon surgery    . Joint replacement    . Appendectomy    . Bladder surgery    . Rectocele repair    . Knee surgery    . Cholecystectomy    . Abdominal hysterectomy    . Ostomy    . Eye surgery      cataracts   Family History  Problem Relation Age of Onset  . Heart failure Mother   . Heart attack Father    Social History  Substance Use Topics  . Smoking  status: Former Smoker -- 1.50 packs/day for 30 years  . Smokeless tobacco: None     Comment: quit 2013  . Alcohol Use: No   OB History    Gravida Para Term Preterm AB TAB SAB Ectopic Multiple Living   0 0 0 0 0 0 0 0       Review of Systems  Musculoskeletal: Positive for neck pain.  All other systems reviewed and are negative.     Allergies  Ampicillin; Oxycodone-acetaminophen; Toradol; Ambien; Ampicillin-sulbactam sodium; Oxycontin; Tape; Toradol; Tramadol; Cephalexin; Latex; Percocet; Sulfamethoxazole-trimethoprim; and Tramadol  Home Medications   Prior to Admission medications   Medication Sig Start Date End Date Taking? Authorizing Provider  acetaminophen (TYLENOL) 500 MG tablet Take 500 mg by mouth every 6 (six) hours as needed for fever.    Historical Provider, MD  ADVAIR DISKUS 250-50 MCG/DOSE AEPB Inhale 1 capsule into the lungs daily. 09/22/14   Historical Provider, MD  albuterol (ACCUNEB) 0.63 MG/3ML nebulizer solution Take 1 ampule by nebulization every 6 (six) hours as needed for wheezing.    Historical Provider, MD  albuterol (PROVENTIL HFA;VENTOLIN HFA) 108 (90 BASE) MCG/ACT inhaler Inhale 1 puff into the lungs  every 6 (six) hours as needed for wheezing or shortness of breath.    Historical Provider, MD  albuterol (PROVENTIL) (2.5 MG/3ML) 0.083% nebulizer solution Take 2.5 mg by nebulization every 6 (six) hours as needed for wheezing or shortness of breath.    Historical Provider, MD  ALPRAZolam Duanne Moron) 1 MG tablet Take 1 mg by mouth at bedtime.    Historical Provider, MD  ALPRAZolam Duanne Moron) 1 MG tablet Take 0.5-1 mg by mouth 2 (two) times daily as needed. Take 1 mg at bedtime as needed for sleep. May take 0.5 mg daily as needed for anxiety. 09/08/14   Historical Provider, MD  amLODipine (NORVASC) 5 MG tablet Take 2.5 mg by mouth 2 (two) times daily.  04/06/13   Historical Provider, MD  amLODipine (NORVASC) 5 MG tablet Take 5 mg by mouth daily. 09/22/14   Historical  Provider, MD  citalopram (CELEXA) 40 MG tablet Take 20 mg by mouth daily. Patient take a half of tab daily    Historical Provider, MD  citalopram (CELEXA) 40 MG tablet Take 20 mg by mouth every morning. 08/26/14   Historical Provider, MD  dicyclomine (BENTYL) 20 MG tablet Take 20 mg by mouth daily as needed for spasms.     Historical Provider, MD  diphenhydrAMINE (BENADRYL) 25 MG tablet Take 1 tablet (25 mg total) by mouth every 6 (six) hours. 01/09/15   Jola Schmidt, MD  DOCUSATE SODIUM PO Take 1 capsule by mouth daily.    Historical Provider, MD  doxycycline (VIBRAMYCIN) 100 MG capsule Take 1 capsule (100 mg total) by mouth 2 (two) times daily. 10/10/14   Verlee Monte, MD  esomeprazole (NEXIUM) 40 MG capsule Take 40 mg by mouth 2 (two) times daily.    Historical Provider, MD  famotidine (PEPCID) 20 MG tablet Take 1 tablet (20 mg total) by mouth 2 (two) times daily. 01/09/15   Jola Schmidt, MD  gabapentin (NEURONTIN) 300 MG capsule Take 300 mg by mouth 3 (three) times daily. 09/20/14   Historical Provider, MD  gabapentin (NEURONTIN) 300 MG capsule Take 300 mg by mouth 3 (three) times daily.    Historical Provider, MD  HYDROcodone-acetaminophen (NORCO/VICODIN) 5-325 MG per tablet Take 1-2 tablets by mouth 3 (three) times daily as needed for moderate pain or severe pain.  08/26/14   Historical Provider, MD  HYDROcodone-acetaminophen (VICODIN) 5-500 MG per tablet Take 1 tablet by mouth every 6 (six) hours as needed. For pain    Historical Provider, MD  lovastatin (MEVACOR) 10 MG tablet Take 10 mg by mouth every morning.     Historical Provider, MD  lovastatin (MEVACOR) 10 MG tablet Take 10 mg by mouth daily. 09/22/14   Historical Provider, MD  meclizine (ANTIVERT) 12.5 MG tablet Take 12.5 mg by mouth 3 (three) times daily as needed for nausea.    Historical Provider, MD  meclizine (ANTIVERT) 25 MG tablet Take 25 mg by mouth 3 (three) times daily as needed for dizziness.  08/26/14   Historical Provider, MD    meloxicam (MOBIC) 7.5 MG tablet Take 7.5 mg by mouth daily.    Historical Provider, MD  NEXIUM 40 MG capsule Take 40 mg by mouth 2 (two) times daily before a meal.  08/26/14   Historical Provider, MD  nystatin (MYCOSTATIN) 100000 UNIT/ML suspension Take 5 mLs by mouth as directed. Swish and spit. 08/02/14   Historical Provider, MD  oxyCODONE-acetaminophen (PERCOCET/ROXICET) 5-325 MG per tablet Take 1 tablet by mouth every 6 (six) hours as needed for severe  pain. 09/22/14   Merryl Hacker, MD  polyethylene glycol powder (GLYCOLAX/MIRALAX) powder Take 17 g by mouth daily. For constipation    Historical Provider, MD  polyethylene glycol powder (GLYCOLAX/MIRALAX) powder Take 17 g by mouth daily. 07/12/14   Historical Provider, MD  predniSONE (DELTASONE) 10 MG tablet '60mg'$  PO x 3 days, 40 mg PO x 3 days, 20 mg PO x 2 days, 10 mg PO 2 days 01/09/15   Jola Schmidt, MD  promethazine (PHENERGAN) 12.5 MG suppository Place 12.5 mg rectally every 6 (six) hours as needed for nausea or vomiting.    Historical Provider, MD  PROMETHEGAN 12.5 MG suppository Place 1 suppository rectally daily as needed for nausea or vomiting.  07/16/14   Historical Provider, MD  senna (SENOKOT) 8.6 MG tablet Take 1-2 tablets by mouth daily.     Historical Provider, MD  sennosides-docusate sodium (SENOKOT-S) 8.6-50 MG tablet Take 1 tablet by mouth daily.    Historical Provider, MD  Soft Lens Products (RA REWETTING DROPS) SOLN 1 application by Does not apply route daily.    Historical Provider, MD  tiZANidine (ZANAFLEX) 4 MG capsule Take 2 mg by mouth at bedtime.     Historical Provider, MD  tiZANidine (ZANAFLEX) 4 MG tablet Take 2 mg by mouth at bedtime. 08/31/14   Historical Provider, MD  traZODone (DESYREL) 50 MG tablet Take 0.5 tablets (25 mg total) by mouth at bedtime. 12/30/14   Penni Bombard, MD  VENTOLIN HFA 108 (90 BASE) MCG/ACT inhaler Take 2 puffs by mouth every 6 (six) hours as needed. 09/22/14   Historical Provider, MD  vitamin  B-12 (CYANOCOBALAMIN) 1000 MCG tablet Take 1,000 mcg by mouth daily.    Historical Provider, MD  vitamin B-12 (CYANOCOBALAMIN) 1000 MCG tablet Take 1,000 mcg by mouth daily.    Historical Provider, MD   BP 118/97 mmHg  Pulse 67  Temp(Src) 97.8 F (36.6 C) (Oral)  Resp 18  Ht '5\' 5"'$  (1.651 m)  Wt 183 lb (83.008 kg)  BMI 30.45 kg/m2  SpO2 94% Physical Exam  Constitutional: She is oriented to person, place, and time. She appears well-developed and well-nourished.  Non-toxic appearance. No distress.  HENT:  Head: Normocephalic and atraumatic.  Eyes: Conjunctivae, EOM and lids are normal. Pupils are equal, round, and reactive to light.  Neck: Normal range of motion. Neck supple. No tracheal deviation present. No thyroid mass present.    Cardiovascular: Normal rate, regular rhythm and normal heart sounds.  Exam reveals no gallop.   No murmur heard. Pulmonary/Chest: Effort normal and breath sounds normal. No stridor. No respiratory distress. She has no decreased breath sounds. She has no wheezes. She has no rhonchi. She has no rales.  Abdominal: Soft. Normal appearance and bowel sounds are normal. She exhibits no distension. There is no tenderness. There is no rebound and no CVA tenderness.  Musculoskeletal: Normal range of motion. She exhibits no edema or tenderness.       Arms: Neurological: She is alert and oriented to person, place, and time. She has normal strength. No cranial nerve deficit or sensory deficit. GCS eye subscore is 4. GCS verbal subscore is 5. GCS motor subscore is 6.  Skin: Skin is warm and dry. No abrasion and no rash noted.  Psychiatric: She has a normal mood and affect. Her speech is normal and behavior is normal.  Nursing note and vitals reviewed.   ED Course  Procedures (including critical care time) Labs Review Labs Reviewed - No data  to display  Imaging Review No results found. I have personally reviewed and evaluated these images and lab results as part of  my medical decision-making.   EKG Interpretation None      MDM   Final diagnoses:  None    Patient given pain meds 2 along with muscle relaxants. Family states that patient has a long-standing history of near syncopal events. Patient offered food here and did have emesis times once. Patient offered IV fluids and blood work and has deferred due to her fear of needles. Patient has pain meds at home consisting of hydrocodone 7.5 mg was encouraged to take this. She has a neurosurgeon as she had already has a scheduled appointment with and she was encouraged to him about her new compression fractures at T2 and T3. Patient has a cane and walker at home and has been encouraged to use them. Family states that she doesn't like to.    Lacretia Leigh, MD 01/26/15 [phone]

## 2015-02-07 DIAGNOSIS — M542 Cervicalgia: Secondary | ICD-10-CM | POA: Diagnosis not present

## 2015-02-07 DIAGNOSIS — M5023 Other cervical disc displacement, cervicothoracic region: Secondary | ICD-10-CM | POA: Diagnosis not present

## 2015-02-15 ENCOUNTER — Encounter (HOSPITAL_COMMUNITY): Payer: Self-pay | Admitting: Emergency Medicine

## 2015-02-15 ENCOUNTER — Emergency Department (HOSPITAL_COMMUNITY): Payer: Medicare Other

## 2015-02-15 ENCOUNTER — Emergency Department (HOSPITAL_COMMUNITY)
Admission: EM | Admit: 2015-02-15 | Discharge: 2015-02-15 | Disposition: A | Discharge status: Home / Self Care | Payer: Medicare Other | Attending: Emergency Medicine | Admitting: Emergency Medicine

## 2015-02-15 DIAGNOSIS — Z8781 Personal history of (healed) traumatic fracture: Secondary | ICD-10-CM | POA: Diagnosis not present

## 2015-02-15 DIAGNOSIS — Y9389 Activity, other specified: Secondary | ICD-10-CM | POA: Insufficient documentation

## 2015-02-15 DIAGNOSIS — Y9289 Other specified places as the place of occurrence of the external cause: Secondary | ICD-10-CM | POA: Insufficient documentation

## 2015-02-15 DIAGNOSIS — F329 Major depressive disorder, single episode, unspecified: Secondary | ICD-10-CM | POA: Diagnosis not present

## 2015-02-15 DIAGNOSIS — S3992XA Unspecified injury of lower back, initial encounter: Secondary | ICD-10-CM | POA: Insufficient documentation

## 2015-02-15 DIAGNOSIS — S0990XA Unspecified injury of head, initial encounter: Secondary | ICD-10-CM | POA: Diagnosis not present

## 2015-02-15 DIAGNOSIS — W06XXXA Fall from bed, initial encounter: Secondary | ICD-10-CM | POA: Diagnosis not present

## 2015-02-15 DIAGNOSIS — M549 Dorsalgia, unspecified: Secondary | ICD-10-CM | POA: Diagnosis not present

## 2015-02-15 DIAGNOSIS — D649 Anemia, unspecified: Secondary | ICD-10-CM | POA: Insufficient documentation

## 2015-02-15 DIAGNOSIS — E785 Hyperlipidemia, unspecified: Secondary | ICD-10-CM | POA: Diagnosis not present

## 2015-02-15 DIAGNOSIS — S161XXA Strain of muscle, fascia and tendon at neck level, initial encounter: Secondary | ICD-10-CM | POA: Diagnosis not present

## 2015-02-15 DIAGNOSIS — S299XXA Unspecified injury of thorax, initial encounter: Secondary | ICD-10-CM | POA: Diagnosis not present

## 2015-02-15 DIAGNOSIS — K219 Gastro-esophageal reflux disease without esophagitis: Secondary | ICD-10-CM | POA: Insufficient documentation

## 2015-02-15 DIAGNOSIS — M545 Low back pain, unspecified: Secondary | ICD-10-CM

## 2015-02-15 DIAGNOSIS — Z8669 Personal history of other diseases of the nervous system and sense organs: Secondary | ICD-10-CM | POA: Insufficient documentation

## 2015-02-15 DIAGNOSIS — M546 Pain in thoracic spine: Secondary | ICD-10-CM | POA: Diagnosis not present

## 2015-02-15 DIAGNOSIS — Z8673 Personal history of transient ischemic attack (TIA), and cerebral infarction without residual deficits: Secondary | ICD-10-CM | POA: Insufficient documentation

## 2015-02-15 DIAGNOSIS — E538 Deficiency of other specified B group vitamins: Secondary | ICD-10-CM | POA: Insufficient documentation

## 2015-02-15 DIAGNOSIS — J449 Chronic obstructive pulmonary disease, unspecified: Secondary | ICD-10-CM | POA: Diagnosis not present

## 2015-02-15 DIAGNOSIS — Z79899 Other long term (current) drug therapy: Secondary | ICD-10-CM | POA: Insufficient documentation

## 2015-02-15 DIAGNOSIS — M1990 Unspecified osteoarthritis, unspecified site: Secondary | ICD-10-CM | POA: Diagnosis not present

## 2015-02-15 DIAGNOSIS — S22020A Wedge compression fracture of second thoracic vertebra, initial encounter for closed fracture: Secondary | ICD-10-CM | POA: Diagnosis not present

## 2015-02-15 DIAGNOSIS — Y998 Other external cause status: Secondary | ICD-10-CM | POA: Insufficient documentation

## 2015-02-15 DIAGNOSIS — S22030A Wedge compression fracture of third thoracic vertebra, initial encounter for closed fracture: Secondary | ICD-10-CM | POA: Diagnosis not present

## 2015-02-15 DIAGNOSIS — R51 Headache: Secondary | ICD-10-CM | POA: Diagnosis not present

## 2015-02-15 DIAGNOSIS — I10 Essential (primary) hypertension: Secondary | ICD-10-CM | POA: Insufficient documentation

## 2015-02-15 DIAGNOSIS — Z87891 Personal history of nicotine dependence: Secondary | ICD-10-CM | POA: Insufficient documentation

## 2015-02-15 DIAGNOSIS — M542 Cervicalgia: Secondary | ICD-10-CM | POA: Diagnosis not present

## 2015-02-15 DIAGNOSIS — T148 Other injury of unspecified body region: Secondary | ICD-10-CM | POA: Diagnosis not present

## 2015-02-15 DIAGNOSIS — Z880 Allergy status to penicillin: Secondary | ICD-10-CM | POA: Insufficient documentation

## 2015-02-15 DIAGNOSIS — S199XXA Unspecified injury of neck, initial encounter: Secondary | ICD-10-CM | POA: Diagnosis present

## 2015-02-15 DIAGNOSIS — W19XXXA Unspecified fall, initial encounter: Secondary | ICD-10-CM

## 2015-02-15 MED ORDER — SODIUM CHLORIDE 0.9 % IV BOLUS (SEPSIS)
250.0000 mL | Freq: Once | INTRAVENOUS | Status: AC
  Administered 2015-02-15: 250 mL via INTRAVENOUS

## 2015-02-15 MED ORDER — HYDROMORPHONE HCL 1 MG/ML IJ SOLN
1.0000 mg | Freq: Once | INTRAMUSCULAR | Status: AC
  Administered 2015-02-15: 1 mg via INTRAVENOUS
  Filled 2015-02-15: qty 1

## 2015-02-15 MED ORDER — DIPHENHYDRAMINE HCL 50 MG/ML IJ SOLN
25.0000 mg | Freq: Once | INTRAMUSCULAR | Status: AC
  Administered 2015-02-15: 25 mg via INTRAVENOUS
  Filled 2015-02-15: qty 1

## 2015-02-15 MED ORDER — SODIUM CHLORIDE 0.9 % IV SOLN
INTRAVENOUS | Status: DC
  Administered 2015-02-15: 100 mL/h via INTRAVENOUS

## 2015-02-15 MED ORDER — ONDANSETRON HCL 4 MG/2ML IJ SOLN
4.0000 mg | Freq: Once | INTRAMUSCULAR | Status: AC
  Administered 2015-02-15: 4 mg via INTRAVENOUS
  Filled 2015-02-15: qty 2

## 2015-02-15 NOTE — Discharge Instructions (Signed)
CT scan of head neck and thoracic back and lumbar back without any acute injuries. Old injuries still present but no acute changes to them. Follow-up with your neurosurgeon as scheduled or as needed.

## 2015-02-15 NOTE — ED Provider Notes (Signed)
CSN: [id number]     Arrival date & time 02/15/15  1742 History   First MD Initiated Contact with Patient 02/15/15 1743     Chief Complaint  Patient presents with  . Fall     (Consider location/radiation/quality/duration/timing/severity/associated sxs/prior Treatment) Patient is a 74 y.o. female presenting with fall. The history is provided by the patient.  Fall Associated symptoms include headaches. Pertinent negatives include no chest pain, no abdominal pain and no shortness of breath.   patient fell out of bed this morning on to hardwood floors. Was eventually up on her feet throughout the day within the pain started to get worse. Currently pain is 10 out of 10 achy sharp pain is to the head posterior neck posterior thoracic back and lumbar spine area. Patient has a history of C7 cervical fracture and fractures of the proximal thoracic vertebrae for which she was in a brace until 3 weeks ago. She is followed by Dr. Saintclair Halsted, neurosurgery, for these injuries.  Patient denies any significant lower extremity weakness or numbness no upper extremity weakness or numbness. Patient does have some pain to the upper thoracic and cervical spine area on a regular basis. The lumbar back pain is new. No loss of consciousness with the fall. Patient is not on any blood thinners.  Past Medical History  Diagnosis Date  . Hiatal hernia   . Anemia   . GERD (gastroesophageal reflux disease)   . Hyperlipidemia   . Depression     major  . Stroke     "mini stroke"  . Osteoarthritis   . Reactive airway disease   . B12 deficiency   . Osteoporosis   . Rupture of bowel   . Bladder incontinence   . DDD (degenerative disc disease), lumbar   . Cataract   . COPD (chronic obstructive pulmonary disease)   . Hypertension   . Stroke   . Macular degeneration   . Blind left eye   . Cervical spine fracture   . Compression fracture     C7,  upper T spine -compression fx   Past Surgical History  Procedure  Laterality Date  . Abdominal surgery    . Colon surgery    . Joint replacement    . Appendectomy    . Bladder surgery    . Rectocele repair    . Knee surgery    . Cholecystectomy    . Abdominal hysterectomy    . Ostomy    . Eye surgery      cataracts   Family History  Problem Relation Age of Onset  . Heart failure Mother   . Heart attack Father    Social History  Substance Use Topics  . Smoking status: Former Smoker -- 1.50 packs/day for 30 years  . Smokeless tobacco: None     Comment: quit 2013  . Alcohol Use: No   OB History    Gravida Para Term Preterm AB TAB SAB Ectopic Multiple Living   0 0 0 0 0 0 0 0       Review of Systems  Constitutional: Negative for fever.  HENT: Negative for congestion.   Eyes: Negative for visual disturbance.  Respiratory: Negative for shortness of breath.   Cardiovascular: Negative for chest pain.  Gastrointestinal: Negative for abdominal pain.  Genitourinary: Negative for dysuria.  Musculoskeletal: Positive for back pain and neck pain.  Skin: Negative for rash.  Neurological: Positive for headaches. Negative for weakness and numbness.  Hematological: Does not bruise/bleed easily.  Psychiatric/Behavioral: Negative for confusion.      Allergies  Ampicillin; Oxycodone-acetaminophen; Toradol; Ambien; Ampicillin-sulbactam sodium; Tape; Cephalexin; Percocet; Sulfamethoxazole-trimethoprim; and Tramadol  Home Medications   Prior to Admission medications   Medication Sig Start Date End Date Taking? Authorizing Provider  albuterol (ACCUNEB) 0.63 MG/3ML nebulizer solution Take 1 ampule by nebulization every 6 (six) hours as needed for wheezing.   Yes Historical Provider, MD  albuterol (PROVENTIL HFA;VENTOLIN HFA) 108 (90 BASE) MCG/ACT inhaler Inhale 1 puff into the lungs every 6 (six) hours as needed for wheezing or shortness of breath.   Yes Historical Provider, MD  albuterol (PROVENTIL) (2.5 MG/3ML) 0.083% nebulizer solution Take 2.5 mg  by nebulization every 6 (six) hours as needed for wheezing or shortness of breath.   Yes Historical Provider, MD  ALPRAZolam Duanne Moron) 1 MG tablet Take 1 mg by mouth at bedtime.   Yes Historical Provider, MD  amLODipine (NORVASC) 5 MG tablet Take 2.5 mg by mouth 2 (two) times daily.  04/06/13  Yes Historical Provider, MD  citalopram (CELEXA) 40 MG tablet Take 20 mg by mouth every morning. 08/26/14  Yes Historical Provider, MD  dicyclomine (BENTYL) 20 MG tablet Take 20 mg by mouth daily as needed for spasms.    Yes Historical Provider, MD  DOCUSATE SODIUM PO Take 1 capsule by mouth daily.   Yes Historical Provider, MD  DULoxetine (CYMBALTA) 30 MG capsule Take 30 mg by mouth daily.   Yes Historical Provider, MD  esomeprazole (NEXIUM) 40 MG capsule Take 40 mg by mouth daily.    Yes Historical Provider, MD  famotidine (PEPCID) 20 MG tablet Take 1 tablet (20 mg total) by mouth 2 (two) times daily. 01/09/15  Yes Jola Schmidt, MD  gabapentin (NEURONTIN) 300 MG capsule Take 300 mg by mouth 3 (three) times daily. 09/20/14  Yes Historical Provider, MD  HYDROcodone-acetaminophen (NORCO) 7.5-325 MG per tablet Take 1-2 tablets by mouth every 6 (six) hours as needed for moderate pain.   Yes Historical Provider, MD  IRON PO Take 1 tablet by mouth daily.   Yes Historical Provider, MD  KRILL OIL PO Take 1 tablet by mouth daily.   Yes Historical Provider, MD  lovastatin (MEVACOR) 10 MG tablet Take 10 mg by mouth daily. 09/22/14  Yes Historical Provider, MD  meclizine (ANTIVERT) 12.5 MG tablet Take 12.5 mg by mouth 3 (three) times daily as needed for nausea.   Yes Historical Provider, MD  nystatin (MYCOSTATIN) 100000 UNIT/ML suspension Take 5 mLs by mouth daily as needed. For mouth sores per daughter Swish and spit. 08/02/14  Yes Historical Provider, MD  polyethylene glycol powder (GLYCOLAX/MIRALAX) powder Take 17 g by mouth daily. For constipation   Yes Historical Provider, MD  promethazine (PHENERGAN) 12.5 MG suppository  Place 12.5 mg rectally every 6 (six) hours as needed for nausea or vomiting.   Yes Historical Provider, MD  senna (SENOKOT) 8.6 MG tablet Take 1-2 tablets by mouth daily.    Yes Historical Provider, MD  Soft Lens Products (RA REWETTING DROPS) SOLN 1 application by Does not apply route daily.   Yes Historical Provider, MD  tiZANidine (ZANAFLEX) 4 MG capsule Take 2 mg by mouth at bedtime.    Yes Historical Provider, MD  traZODone (DESYREL) 50 MG tablet Take 0.5 tablets (25 mg total) by mouth at bedtime. 12/30/14  Yes Penni Bombard, MD  vitamin B-12 (CYANOCOBALAMIN) 1000 MCG tablet Take 1,000 mcg by mouth daily.   Yes Historical Provider, MD  diazepam (VALIUM) 2 MG  tablet Take 1 tablet (2 mg total) by mouth every 4 (four) hours as needed for muscle spasms. Patient not taking: Reported on 02/15/2015 01/26/15   Lacretia Leigh, MD  diphenhydrAMINE (BENADRYL) 25 MG tablet Take 1 tablet (25 mg total) by mouth every 6 (six) hours. Patient not taking: Reported on 02/15/2015 01/09/15   Jola Schmidt, MD  doxycycline (VIBRAMYCIN) 100 MG capsule Take 1 capsule (100 mg total) by mouth 2 (two) times daily. Patient not taking: Reported on 02/15/2015 10/10/14   Verlee Monte, MD  oxyCODONE-acetaminophen (PERCOCET/ROXICET) 5-325 MG per tablet Take 1 tablet by mouth every 6 (six) hours as needed for severe pain. Patient not taking: Reported on 02/15/2015 09/22/14   Merryl Hacker, MD  predniSONE (DELTASONE) 10 MG tablet '60mg'$  PO x 3 days, 40 mg PO x 3 days, 20 mg PO x 2 days, 10 mg PO 2 days Patient not taking: Reported on 02/15/2015 01/09/15   Jola Schmidt, MD   BP 122/92 mmHg  Pulse 72  Temp(Src) 97.7 F (36.5 C) (Oral)  Resp 18  Ht '5\' 5"'$  (1.651 m)  Wt 182 lb (82.555 kg)  BMI 30.29 kg/m2  SpO2 95% Physical Exam  Constitutional: She is oriented to person, place, and time. She appears well-developed and well-nourished. No distress.  HENT:  Head: Normocephalic and atraumatic.  Mouth/Throat: Oropharynx is clear and  moist.  Eyes: Conjunctivae and EOM are normal. Pupils are equal, round, and reactive to light.  Neck:  Arrived with neck graft Intal and taped. Some posterior tenderness midline part of the cervical spine with a bulge around C7.  Cardiovascular: Normal rate, regular rhythm and normal heart sounds.   No murmur heard. Pulmonary/Chest: Effort normal and breath sounds normal. No respiratory distress.  Abdominal: Soft. Bowel sounds are normal. There is no tenderness.  Musculoskeletal: She exhibits tenderness.  Tender to palpation along posterior part of cervical neck and thoracic and lower lumbar area.  Neurological: She is alert and oriented to person, place, and time. No cranial nerve deficit. She exhibits normal muscle tone. Coordination normal.  Skin: Skin is warm. No rash noted.  Nursing note and vitals reviewed.   ED Course  Procedures (including critical care time) Labs Review Labs Reviewed - No data to display  Imaging Review Ct Head Wo Contrast  02/15/2015   CLINICAL DATA:  Head and neck pain after falling out of bed onto a hardwood floor.  EXAM: CT HEAD WITHOUT CONTRAST  CT CERVICAL SPINE WITHOUT CONTRAST  TECHNIQUE: Multidetector CT imaging of the head and cervical spine was performed following the standard protocol without intravenous contrast. Multiplanar CT image reconstructions of the cervical spine were also generated.  COMPARISON:  CT scan of the head dated 10/25/2014 and CT scan of the cervical spine dated 01/26/2015  FINDINGS: CT HEAD FINDINGS  No mass lesion. No midline shift. No acute hemorrhage or hematoma. No extra-axial fluid collections. No evidence of acute infarction. There is diffuse slight cerebral cortical atrophy with secondary ventricular dilatation. Brain parenchyma is otherwise normal. No acute osseous abnormalities.  CT CERVICAL SPINE FINDINGS  There are old compression fractures of C7 and of T2 and of the superior aspect of T3. There is no acute fracture. No  subluxation or prevertebral soft tissue swelling. Multilevel degenerative facet arthritis from C2-3 through C6-7 on the left. Disc space narrowing at T to 3, chronic.  No visible disc protrusions.  IMPRESSION: 1. No acute intracranial abnormality.  Diffuse slight atrophy. 2. No acute abnormality of the cervical  spine. Old fractures of C7 and T2 and T3.   Electronically Signed   By: Lorriane Shire M.D.   On: 02/15/2015 20:56   Ct Cervical Spine Wo Contrast  02/15/2015   CLINICAL DATA:  Head and neck pain after falling out of bed onto a hardwood floor.  EXAM: CT HEAD WITHOUT CONTRAST  CT CERVICAL SPINE WITHOUT CONTRAST  TECHNIQUE: Multidetector CT imaging of the head and cervical spine was performed following the standard protocol without intravenous contrast. Multiplanar CT image reconstructions of the cervical spine were also generated.  COMPARISON:  CT scan of the head dated 10/25/2014 and CT scan of the cervical spine dated 01/26/2015  FINDINGS: CT HEAD FINDINGS  No mass lesion. No midline shift. No acute hemorrhage or hematoma. No extra-axial fluid collections. No evidence of acute infarction. There is diffuse slight cerebral cortical atrophy with secondary ventricular dilatation. Brain parenchyma is otherwise normal. No acute osseous abnormalities.  CT CERVICAL SPINE FINDINGS  There are old compression fractures of C7 and of T2 and of the superior aspect of T3. There is no acute fracture. No subluxation or prevertebral soft tissue swelling. Multilevel degenerative facet arthritis from C2-3 through C6-7 on the left. Disc space narrowing at T to 3, chronic.  No visible disc protrusions.  IMPRESSION: 1. No acute intracranial abnormality.  Diffuse slight atrophy. 2. No acute abnormality of the cervical spine. Old fractures of C7 and T2 and T3.   Electronically Signed   By: Lorriane Shire M.D.   On: 02/15/2015 20:56   Ct Thoracic Spine Wo Contrast  02/15/2015   CLINICAL DATA:  [place] out of bed onto hardwood  floors, head neck pain, recent upper thoracic fracture, new fall  EXAM: CT THORACIC AND LUMBAR SPINE WITHOUT CONTRAST  TECHNIQUE: Multidetector CT imaging of the thoracic and lumbar spine was performed without contrast. Multiplanar CT image reconstructions were also generated.  COMPARISON:  CT thoracic spine 01/26/2015, lumbar spine radiographs 09/22/2014, CT abdomen and pelvis 10/17/2012  FINDINGS: CT THORACIC SPINE FINDINGS  Diffuse osseous demineralization.  Scattered atherosclerotic calcifications with ascending thoracic aorta 3.9 cm transverse image 42.  Additional atherosclerotic calcifications at coronary arteries and proximal great vessels.  No thoracic adenopathy.  Calcified granuloma LEFT upper lobe image 19.  Anterior compression fracture of T2 unchanged, involving both superior and inferior endplates.  Superior endplate compression fracture T3 unchanged.  Minimal concavity superior endplate T4 stable.  No new fracture, subluxation or bone destruction.  Visualized portions of ribs and scapulae unremarkable.  CT LUMBAR SPINE FINDINGS  Small calcified renal artery aneurysms bilaterally 10 mm RIGHT hand 8 mm LEFT.  Atherosclerotic calcifications aorta.  LEFT adrenal nodule 14 x 10 mm stable since 10/17/2012.  Based on numbering of ribs and thoracic spine, only 4 lumbar type vertebral bodies are identified.  No lumbar fracture or subluxation.  Mild scattered degenerative disc disease changes.  Inferior endplate concavities at L2, L3 and L4 are stable from prior radiographs.  SI joints symmetric.  Diffusely bulging discs at L1-L2, L2-L3, and L3-L4.  Significant multifactorial spinal stenosis at L2-L3.  IMPRESSION: CT THORACIC SPINE IMPRESSION  Stable compression fractures at T2 and T3.  Stable superior endplate concavity T4.  Osseous demineralization with scattered degenerative disc disease changes.  No acute abnormalities.  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  Recommend  annual imaging followup by CTA or MRA. This recommendation follows 2010 ACCF/AHA/AATS/ACR/ASA/SCA/SCAI/SIR/STS/SVM Guidelines for the Diagnosis and Management of Patients with Thoracic Aortic Disease. Circulation.2010; 121: J009-F818  CT LUMBAR SPINE IMPRESSION  No acute lumbar spine abnormalities.  Stable inferior endplate concavities at L2, L3, and L4.  Bulging discs at multiple levels with significant multifactorial spinal stenosis likely present at L2-L3.  Small BILATERAL calcified renal artery aneurysms.   Electronically Signed   By: Lavonia Dana M.D.   On: 02/15/2015 20:46   Ct Lumbar Spine Wo Contrast  02/15/2015   CLINICAL DATA:  [place] out of bed onto hardwood floors, head neck pain, recent upper thoracic fracture, new fall  EXAM: CT THORACIC AND LUMBAR SPINE WITHOUT CONTRAST  TECHNIQUE: Multidetector CT imaging of the thoracic and lumbar spine was performed without contrast. Multiplanar CT image reconstructions were also generated.  COMPARISON:  CT thoracic spine 01/26/2015, lumbar spine radiographs 09/22/2014, CT abdomen and pelvis 10/17/2012  FINDINGS: CT THORACIC SPINE FINDINGS  Diffuse osseous demineralization.  Scattered atherosclerotic calcifications with ascending thoracic aorta 3.9 cm transverse image 42.  Additional atherosclerotic calcifications at coronary arteries and proximal great vessels.  No thoracic adenopathy.  Calcified granuloma LEFT upper lobe image 19.  Anterior compression fracture of T2 unchanged, involving both superior and inferior endplates.  Superior endplate compression fracture T3 unchanged.  Minimal concavity superior endplate T4 stable.  No new fracture, subluxation or bone destruction.  Visualized portions of ribs and scapulae unremarkable.  CT LUMBAR SPINE FINDINGS  Small calcified renal artery aneurysms bilaterally 10 mm RIGHT hand 8 mm LEFT.  Atherosclerotic calcifications aorta.  LEFT adrenal nodule 14 x 10 mm stable since 10/17/2012.  Based on numbering of ribs and  thoracic spine, only 4 lumbar type vertebral bodies are identified.  No lumbar fracture or subluxation.  Mild scattered degenerative disc disease changes.  Inferior endplate concavities at L2, L3 and L4 are stable from prior radiographs.  SI joints symmetric.  Diffusely bulging discs at L1-L2, L2-L3, and L3-L4.  Significant multifactorial spinal stenosis at L2-L3.  IMPRESSION: CT THORACIC SPINE IMPRESSION  Stable compression fractures at T2 and T3.  Stable superior endplate concavity T4.  Osseous demineralization with scattered degenerative disc disease changes.  No acute abnormalities.  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  Recommend annual imaging followup by CTA or MRA. This recommendation follows 2010 ACCF/AHA/AATS/ACR/ASA/SCA/SCAI/SIR/STS/SVM Guidelines for the Diagnosis and Management of Patients with Thoracic Aortic Disease. Circulation.2010; 121: Q197-J883  CT LUMBAR SPINE IMPRESSION  No acute lumbar spine abnormalities.  Stable inferior endplate concavities at L2, L3, and L4.  Bulging discs at multiple levels with significant multifactorial spinal stenosis likely present at L2-L3.  Small BILATERAL calcified renal artery aneurysms.   Electronically Signed   By: Lavonia Dana M.D.   On: 02/15/2015 20:46   I have personally reviewed and evaluated these images and lab results as part of my medical decision-making.   EKG Interpretation None      MDM   Final diagnoses:  Fall, initial encounter  Cervical strain, acute, initial encounter  Midline thoracic back pain  Midline low back pain without sciatica   Extensive CT scan workup for the fall out of bed to include head neck thoracic back and lumbar back without any acute or new injuries. Old injuries without acute changes. Patient improved here with pain medicine. Patient will be discharged home for follow-up with her neurosurgeon.   Fredia Sorrow, MD 02/15/15 2220

## 2015-02-15 NOTE — ED Notes (Signed)
Onset today fell out of bed while sleeping onto hardwood floors.  States head and neck pain during the day worsening over time.  Pain currently 10/10 achy sharp.  Alert answering and following commands appropriate.

## 2015-02-17 DIAGNOSIS — G8929 Other chronic pain: Secondary | ICD-10-CM | POA: Diagnosis not present

## 2015-02-17 DIAGNOSIS — M542 Cervicalgia: Secondary | ICD-10-CM | POA: Diagnosis not present

## 2015-02-20 ENCOUNTER — Encounter (HOSPITAL_COMMUNITY): Payer: Self-pay | Admitting: Emergency Medicine

## 2015-02-20 ENCOUNTER — Emergency Department (HOSPITAL_COMMUNITY): Payer: Medicare Other

## 2015-02-20 ENCOUNTER — Emergency Department (HOSPITAL_COMMUNITY)
Admission: EM | Admit: 2015-02-20 | Discharge: 2015-02-20 | Disposition: A | Discharge status: Home / Self Care | Payer: Medicare Other | Attending: Emergency Medicine | Admitting: Emergency Medicine

## 2015-02-20 DIAGNOSIS — Z8673 Personal history of transient ischemic attack (TIA), and cerebral infarction without residual deficits: Secondary | ICD-10-CM | POA: Insufficient documentation

## 2015-02-20 DIAGNOSIS — F329 Major depressive disorder, single episode, unspecified: Secondary | ICD-10-CM | POA: Insufficient documentation

## 2015-02-20 DIAGNOSIS — D519 Vitamin B12 deficiency anemia, unspecified: Secondary | ICD-10-CM | POA: Diagnosis not present

## 2015-02-20 DIAGNOSIS — I10 Essential (primary) hypertension: Secondary | ICD-10-CM | POA: Diagnosis not present

## 2015-02-20 DIAGNOSIS — J449 Chronic obstructive pulmonary disease, unspecified: Secondary | ICD-10-CM | POA: Insufficient documentation

## 2015-02-20 DIAGNOSIS — S3992XA Unspecified injury of lower back, initial encounter: Secondary | ICD-10-CM | POA: Insufficient documentation

## 2015-02-20 DIAGNOSIS — H5412 Blindness, left eye, low vision right eye: Secondary | ICD-10-CM | POA: Diagnosis not present

## 2015-02-20 DIAGNOSIS — S0181XA Laceration without foreign body of other part of head, initial encounter: Secondary | ICD-10-CM | POA: Insufficient documentation

## 2015-02-20 DIAGNOSIS — E785 Hyperlipidemia, unspecified: Secondary | ICD-10-CM | POA: Insufficient documentation

## 2015-02-20 DIAGNOSIS — Y999 Unspecified external cause status: Secondary | ICD-10-CM | POA: Insufficient documentation

## 2015-02-20 DIAGNOSIS — Z87891 Personal history of nicotine dependence: Secondary | ICD-10-CM | POA: Diagnosis not present

## 2015-02-20 DIAGNOSIS — W01198A Fall on same level from slipping, tripping and stumbling with subsequent striking against other object, initial encounter: Secondary | ICD-10-CM | POA: Insufficient documentation

## 2015-02-20 DIAGNOSIS — Y9389 Activity, other specified: Secondary | ICD-10-CM | POA: Diagnosis not present

## 2015-02-20 DIAGNOSIS — S0101XA Laceration without foreign body of scalp, initial encounter: Secondary | ICD-10-CM | POA: Diagnosis not present

## 2015-02-20 DIAGNOSIS — K219 Gastro-esophageal reflux disease without esophagitis: Secondary | ICD-10-CM | POA: Insufficient documentation

## 2015-02-20 DIAGNOSIS — M1990 Unspecified osteoarthritis, unspecified site: Secondary | ICD-10-CM | POA: Insufficient documentation

## 2015-02-20 DIAGNOSIS — IMO0002 Reserved for concepts with insufficient information to code with codable children: Secondary | ICD-10-CM

## 2015-02-20 DIAGNOSIS — R52 Pain, unspecified: Secondary | ICD-10-CM | POA: Diagnosis not present

## 2015-02-20 DIAGNOSIS — M549 Dorsalgia, unspecified: Secondary | ICD-10-CM | POA: Diagnosis not present

## 2015-02-20 DIAGNOSIS — R531 Weakness: Secondary | ICD-10-CM | POA: Diagnosis not present

## 2015-02-20 DIAGNOSIS — Y929 Unspecified place or not applicable: Secondary | ICD-10-CM | POA: Insufficient documentation

## 2015-02-20 DIAGNOSIS — G8929 Other chronic pain: Secondary | ICD-10-CM | POA: Insufficient documentation

## 2015-02-20 DIAGNOSIS — D649 Anemia, unspecified: Secondary | ICD-10-CM | POA: Insufficient documentation

## 2015-02-20 DIAGNOSIS — S3690XA Unspecified injury of unspecified intra-abdominal organ, initial encounter: Secondary | ICD-10-CM | POA: Diagnosis not present

## 2015-02-20 DIAGNOSIS — Z79899 Other long term (current) drug therapy: Secondary | ICD-10-CM | POA: Diagnosis not present

## 2015-02-20 DIAGNOSIS — W19XXXA Unspecified fall, initial encounter: Secondary | ICD-10-CM

## 2015-02-20 DIAGNOSIS — M542 Cervicalgia: Secondary | ICD-10-CM | POA: Diagnosis not present

## 2015-02-20 DIAGNOSIS — Z8781 Personal history of (healed) traumatic fracture: Secondary | ICD-10-CM | POA: Insufficient documentation

## 2015-02-20 DIAGNOSIS — S0990XA Unspecified injury of head, initial encounter: Secondary | ICD-10-CM | POA: Diagnosis present

## 2015-02-20 LAB — CBC WITH DIFFERENTIAL/PLATELET
Basophils Absolute: 0 10*3/uL (ref 0.0–0.1)
Basophils Relative: 1 %
Eosinophils Absolute: 0.2 10*3/uL (ref 0.0–0.7)
Eosinophils Relative: 3 %
HCT: 45.6 % (ref 36.0–46.0)
Hemoglobin: 14.7 g/dL (ref 12.0–15.0)
Lymphocytes Relative: 32 %
Lymphs Abs: 2.1 10*3/uL (ref 0.7–4.0)
MCH: 29.2 pg (ref 26.0–34.0)
MCHC: 32.2 g/dL (ref 30.0–36.0)
MCV: 90.5 fL (ref 78.0–100.0)
Monocytes Absolute: 0.7 10*3/uL (ref 0.1–1.0)
Monocytes Relative: 11 %
Neutro Abs: 3.5 10*3/uL (ref 1.7–7.7)
Neutrophils Relative %: 53 %
Platelets: 189 10*3/uL (ref 150–400)
RBC: 5.04 MIL/uL (ref 3.87–5.11)
RDW: 13.7 % (ref 11.5–15.5)
WBC: 6.6 10*3/uL (ref 4.0–10.5)

## 2015-02-20 LAB — URINALYSIS, ROUTINE W REFLEX MICROSCOPIC
Bilirubin Urine: NEGATIVE
Glucose, UA: NEGATIVE mg/dL
Hgb urine dipstick: NEGATIVE
Ketones, ur: NEGATIVE mg/dL
Leukocytes, UA: NEGATIVE
Nitrite: NEGATIVE
Protein, ur: NEGATIVE mg/dL
Specific Gravity, Urine: 1.01 (ref 1.005–1.030)
Urobilinogen, UA: 0.2 mg/dL (ref 0.0–1.0)
pH: 7 (ref 5.0–8.0)

## 2015-02-20 LAB — I-STAT TROPONIN, ED: Troponin i, poc: 0 ng/mL (ref 0.00–0.08)

## 2015-02-20 LAB — I-STAT CHEM 8, ED
BUN: 10 mg/dL (ref 6–20)
Calcium, Ion: 1.22 mmol/L (ref 1.13–1.30)
Chloride: 104 mmol/L (ref 101–111)
Creatinine, Ser: 1.1 mg/dL — ABNORMAL HIGH (ref 0.44–1.00)
Glucose, Bld: 105 mg/dL — ABNORMAL HIGH (ref 65–99)
HCT: 46 % (ref 36.0–46.0)
Hemoglobin: 15.6 g/dL — ABNORMAL HIGH (ref 12.0–15.0)
Potassium: 3.3 mmol/L — ABNORMAL LOW (ref 3.5–5.1)
Sodium: 145 mmol/L (ref 135–145)
TCO2: 29 mmol/L (ref 0–100)

## 2015-02-20 MED ORDER — MORPHINE SULFATE (PF) 4 MG/ML IV SOLN
4.0000 mg | Freq: Once | INTRAVENOUS | Status: AC
  Administered 2015-02-20: 4 mg via INTRAVENOUS
  Filled 2015-02-20: qty 1

## 2015-02-20 MED ORDER — LIDOCAINE-EPINEPHRINE 1 %-1:100000 IJ SOLN
10.0000 mL | Freq: Once | INTRAMUSCULAR | Status: AC
  Administered 2015-02-20: 10 mL via INTRADERMAL
  Filled 2015-02-20: qty 1

## 2015-02-20 NOTE — Discharge Instructions (Signed)
Fall Prevention and Home Safety [name], keep a dressing or Band-Aid on your laceration. See your primary care physician within 3 days for a wound check. If you cannot get an appointment come to the emergency department to have a wound check. Her sutures will not dissolved, they need to be taken out in 5-7 days. Falls cause injuries and can affect all age groups. It is possible to prevent falls.  HOW TO PREVENT FALLS  Wear shoes with rubber soles that do not have an opening for your toes.  Keep the inside and outside of your house well lit.  Use night lights throughout your home.  Remove clutter from floors.  Clean up floor spills.  Remove throw rugs or fasten them to the floor with carpet tape.  Do not place electrical cords across pathways.  Put grab bars by your tub, shower, and toilet. Do not use towel bars as grab bars.  Put handrails on both sides of the stairway. Fix loose handrails.  Do not climb on stools or stepladders, if possible.  Do not wax your floors.  Repair uneven or unsafe sidewalks, walkways, or stairs.  Keep items you use a lot within reach.  Be aware of pets.  Keep emergency numbers next to the telephone.  Put smoke detectors in your home and near bedrooms. Ask your doctor what other things you can do to prevent falls. Document Released: 03/09/2009 Document Revised: 11/12/2011 Document Reviewed: 08/13/2011 [facility] Patient Information 2015 [place], [place]. This information is not intended to replace advice given to you by your health care provider. Make sure you discuss any questions you have with your health care provider. Laceration Care, Adult A laceration is a cut that goes through all layers of the skin. The cut goes into the tissue beneath the skin. HOME CARE For stitches (sutures) or staples:  Keep the cut clean and dry.  If you have a bandage (dressing), change it at least once a day. Change the bandage if it gets wet or dirty, or as told  by your doctor.  Wash the cut with soap and water 2 times a day. Rinse the cut with water. Pat it dry with a clean towel.  Put a thin layer of medicated cream on the cut as told by your doctor.  You may shower after the first 24 hours. Do not soak the cut in water until the stitches are removed.  Only take medicines as told by your doctor.  Have your stitches or staples removed as told by your doctor. For skin adhesive strips:  Keep the cut clean and dry.  Do not get the strips wet. You may take a bath, but be careful to keep the cut dry.  If the cut gets wet, pat it dry with a clean towel.  The strips will fall off on their own. Do not remove the strips that are still stuck to the cut. For wound glue:  You may shower or take baths. Do not soak or scrub the cut. Do not swim. Avoid heavy sweating until the glue falls off on its own. After a shower or bath, pat the cut dry with a clean towel.  Do not put medicine on your cut until the glue falls off.  If you have a bandage, do not put tape over the glue.  Avoid lots of sunlight or tanning lamps until the glue falls off. Put sunscreen on the cut for the first year to reduce your scar.  The glue will  fall off on its own. Do not pick at the glue. You may need a tetanus shot if:  You cannot remember when you had your last tetanus shot.  You have never had a tetanus shot. If you need a tetanus shot and you choose not to have one, you may get tetanus. Sickness from tetanus can be serious. GET HELP RIGHT AWAY IF:   Your pain does not get better with medicine.  Your arm, hand, leg, or foot loses feeling (numbness) or changes color.  Your cut is bleeding.  Your joint feels weak, or you cannot use your joint.  You have painful lumps on your body.  Your cut is red, puffy (swollen), or painful.  You have a red line on the skin near the cut.  You have yellowish-white fluid (pus) coming from the cut.  You have a fever.  You  have a bad smell coming from the cut or bandage.  Your cut breaks open before or after stitches are removed.  You notice something coming out of the cut, such as wood or glass.  You cannot move a finger or toe. MAKE SURE YOU:   Understand these instructions.  Will watch your condition.  Will get help right away if you are not doing well or get worse. Document Released: 10/30/2007 Document Revised: 08/05/2011 Document Reviewed: 11/06/2010 [facility] Patient Information 2015 [place], [place]. This information is not intended to replace advice given to you by your health care provider. Make sure you discuss any questions you have with your health care provider.

## 2015-02-20 NOTE — ED Provider Notes (Signed)
CSN: [id number]     Arrival date & time 02/20/15  0400 History   First MD Initiated Contact with Patient 02/20/15 0404     Chief Complaint  Patient presents with  . Fall     (Consider location/radiation/quality/duration/timing/severity/associated sxs/prior Treatment) HPI  [name] is a 74yo female, h/o frequent falls, here with another fall.  She was walking to the bathroom and trippd over an object on the floor.  She denies syncope or LOC.  Her daughter found her in a pool of blood.  She denies any prodromal symptoms.  She complains of a laceration to her forehead and a headache.  She also complains of chronic back pain, but fell forward and denies hitting her back.  She has no neuro complaints.  She has no further concerns.  She dpes not take any blood thinners.  10 Systems reviewed and are negative for acute change except as noted in the HPI.     Past Medical History  Diagnosis Date  . Hiatal hernia   . Anemia   . GERD (gastroesophageal reflux disease)   . Hyperlipidemia   . Depression     major  . Stroke     "mini stroke"  . Osteoarthritis   . Reactive airway disease   . B12 deficiency   . Osteoporosis   . Rupture of bowel   . Bladder incontinence   . DDD (degenerative disc disease), lumbar   . Cataract   . COPD (chronic obstructive pulmonary disease)   . Hypertension   . Stroke   . Macular degeneration   . Blind left eye   . Cervical spine fracture   . Compression fracture     C7,  upper T spine -compression fx   Past Surgical History  Procedure Laterality Date  . Abdominal surgery    . Colon surgery    . Joint replacement    . Appendectomy    . Bladder surgery    . Rectocele repair    . Knee surgery    . Cholecystectomy    . Abdominal hysterectomy    . Ostomy    . Eye surgery      cataracts   Family History  Problem Relation Age of Onset  . Heart failure Mother   . Heart attack Father    Social History  Substance Use Topics  . Smoking status:  Former Smoker -- 1.50 packs/day for 30 years  . Smokeless tobacco: None     Comment: quit 2013  . Alcohol Use: No   OB History    Gravida Para Term Preterm AB TAB SAB Ectopic Multiple Living   0 0 0 0 0 0 0 0       Review of Systems    Allergies  Ampicillin; Oxycodone-acetaminophen; Toradol; Ambien; Ampicillin-sulbactam sodium; Tape; Cephalexin; Percocet; Sulfamethoxazole-trimethoprim; and Tramadol  Home Medications   Prior to Admission medications   Medication Sig Start Date End Date Taking? Authorizing Provider  albuterol (ACCUNEB) 0.63 MG/3ML nebulizer solution Take 1 ampule by nebulization every 6 (six) hours as needed for wheezing.    Historical Provider, MD  albuterol (PROVENTIL HFA;VENTOLIN HFA) 108 (90 BASE) MCG/ACT inhaler Inhale 1 puff into the lungs every 6 (six) hours as needed for wheezing or shortness of breath.    Historical Provider, MD  albuterol (PROVENTIL) (2.5 MG/3ML) 0.083% nebulizer solution Take 2.5 mg by nebulization every 6 (six) hours as needed for wheezing or shortness of breath.    Historical Provider, MD  ALPRAZolam (  XANAX) 1 MG tablet Take 1 mg by mouth at bedtime.    Historical Provider, MD  amLODipine (NORVASC) 5 MG tablet Take 2.5 mg by mouth 2 (two) times daily.  04/06/13   Historical Provider, MD  citalopram (CELEXA) 40 MG tablet Take 20 mg by mouth every morning. 08/26/14   Historical Provider, MD  diazepam (VALIUM) 2 MG tablet Take 1 tablet (2 mg total) by mouth every 4 (four) hours as needed for muscle spasms. Patient not taking: Reported on 02/15/2015 01/26/15   Lacretia Leigh, MD  dicyclomine (BENTYL) 20 MG tablet Take 20 mg by mouth daily as needed for spasms.     Historical Provider, MD  diphenhydrAMINE (BENADRYL) 25 MG tablet Take 1 tablet (25 mg total) by mouth every 6 (six) hours. Patient not taking: Reported on 02/15/2015 01/09/15   Jola Schmidt, MD  DOCUSATE SODIUM PO Take 1 capsule by mouth daily.    Historical Provider, MD  doxycycline  (VIBRAMYCIN) 100 MG capsule Take 1 capsule (100 mg total) by mouth 2 (two) times daily. Patient not taking: Reported on 02/15/2015 10/10/14   Verlee Monte, MD  DULoxetine (CYMBALTA) 30 MG capsule Take 30 mg by mouth daily.    Historical Provider, MD  esomeprazole (NEXIUM) 40 MG capsule Take 40 mg by mouth daily.     Historical Provider, MD  famotidine (PEPCID) 20 MG tablet Take 1 tablet (20 mg total) by mouth 2 (two) times daily. 01/09/15   Jola Schmidt, MD  gabapentin (NEURONTIN) 300 MG capsule Take 300 mg by mouth 3 (three) times daily. 09/20/14   Historical Provider, MD  HYDROcodone-acetaminophen (NORCO) 7.5-325 MG per tablet Take 1-2 tablets by mouth every 6 (six) hours as needed for moderate pain.    Historical Provider, MD  IRON PO Take 1 tablet by mouth daily.    Historical Provider, MD  KRILL OIL PO Take 1 tablet by mouth daily.    Historical Provider, MD  lovastatin (MEVACOR) 10 MG tablet Take 10 mg by mouth daily. 09/22/14   Historical Provider, MD  meclizine (ANTIVERT) 12.5 MG tablet Take 12.5 mg by mouth 3 (three) times daily as needed for nausea.    Historical Provider, MD  nystatin (MYCOSTATIN) 100000 UNIT/ML suspension Take 5 mLs by mouth daily as needed. For mouth sores per daughter Swish and spit. 08/02/14   Historical Provider, MD  oxyCODONE-acetaminophen (PERCOCET/ROXICET) 5-325 MG per tablet Take 1 tablet by mouth every 6 (six) hours as needed for severe pain. Patient not taking: Reported on 02/15/2015 09/22/14   Merryl Hacker, MD  polyethylene glycol powder (GLYCOLAX/MIRALAX) powder Take 17 g by mouth daily. For constipation    Historical Provider, MD  predniSONE (DELTASONE) 10 MG tablet '60mg'$  PO x 3 days, 40 mg PO x 3 days, 20 mg PO x 2 days, 10 mg PO 2 days Patient not taking: Reported on 02/15/2015 01/09/15   Jola Schmidt, MD  promethazine (PHENERGAN) 12.5 MG suppository Place 12.5 mg rectally every 6 (six) hours as needed for nausea or vomiting.    Historical Provider, MD  senna  (SENOKOT) 8.6 MG tablet Take 1-2 tablets by mouth daily.     Historical Provider, MD  Soft Lens Products (RA REWETTING DROPS) SOLN 1 application by Does not apply route daily.    Historical Provider, MD  tiZANidine (ZANAFLEX) 4 MG capsule Take 2 mg by mouth at bedtime.     Historical Provider, MD  traZODone (DESYREL) 50 MG tablet Take 0.5 tablets (25 mg total) by mouth  at bedtime. 12/30/14   Penni Bombard, MD  vitamin B-12 (CYANOCOBALAMIN) 1000 MCG tablet Take 1,000 mcg by mouth daily.    Historical Provider, MD   BP 137/69 mmHg  Pulse 74  Temp(Src) 98 F (36.7 C)  Resp 16  Ht '5\' 5"'$  (1.651 m)  Wt 182 lb (82.555 kg)  BMI 30.29 kg/m2  SpO2 94% Physical Exam  Constitutional: She is oriented to person, place, and time. She appears well-developed and well-nourished. No distress.  HENT:  Head: Normocephalic.  Nose: Nose normal.  Mouth/Throat: Oropharynx is clear and moist. No oropharyngeal exudate.  4cm laceration to right forehead, no active bleeding  Eyes: Conjunctivae and EOM are normal. Pupils are equal, round, and reactive to light. No scleral icterus.  Neck: Normal range of motion. Neck supple. No JVD present. No tracheal deviation present. No thyromegaly present.  c collar in place.  Cardiovascular: Normal rate, regular rhythm and normal heart sounds.  Exam reveals no gallop and no friction rub.   No murmur heard. Pulmonary/Chest: Effort normal and breath sounds normal. No respiratory distress. She has no wheezes. She exhibits no tenderness.  Abdominal: Soft. Bowel sounds are normal. She exhibits no distension and no mass. There is no tenderness. There is no rebound and no guarding.  Musculoskeletal: Normal range of motion. She exhibits no edema or tenderness.  Lymphadenopathy:    She has no cervical adenopathy.  Neurological: She is alert and oriented to person, place, and time. No cranial nerve deficit. She exhibits normal muscle tone.  Drowsy but easily arousable Normal  strength and sensation in all extremities.  Skin: Skin is warm and dry. No rash noted. No erythema. No pallor.  Nursing note and vitals reviewed.   ED Course  Procedures (including critical care time) Labs Review Labs Reviewed  I-STAT CHEM 8, ED - Abnormal; Notable for the following:    Potassium 3.3 (*)    Creatinine, Ser 1.10 (*)    Glucose, Bld 105 (*)    Hemoglobin 15.6 (*)    All other components within normal limits  CBC WITH DIFFERENTIAL/PLATELET  URINALYSIS, ROUTINE W REFLEX MICROSCOPIC (NOT AT [facility])  Randolm Idol, ED    Imaging Review Dg Chest 2 View  02/20/2015   CLINICAL DATA:  Fall with neck and back pain.  Initial encounter.  EXAM: CHEST  2 VIEW  COMPARISON:  10/25/2014  FINDINGS: Chronic cardiomegaly and aortic tortuosity.  Hypoventilation with interstitial crowding and coarsening, stable. There is no edema, consolidation, effusion, or pneumothorax. No visible fracture.  IMPRESSION: Stable exam.  No evidence of acute cardiopulmonary disease.   Electronically Signed   By: Monte Fantasia M.D.   On: 02/20/2015 05:35   Ct Head Wo Contrast  02/20/2015   CLINICAL DATA:  Fall with forehead laceration.  Lethargy.  EXAM: CT HEAD WITHOUT CONTRAST  CT CERVICAL SPINE WITHOUT CONTRAST  TECHNIQUE: Multidetector CT imaging of the head and cervical spine was performed following the standard protocol without intravenous contrast. Multiplanar CT image reconstructions of the cervical spine were also generated.  COMPARISON:  Five days ago  FINDINGS: CT HEAD FINDINGS  Skull and Sinuses:Right frontal scalp laceration appears to extend to bone. No fracture or opaque foreign body.  Orbits: No acute finding.  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 Right cataract resection.  Brain: No evidence of acute infarction, hemorrhage, hydrocephalus, or mass lesion/mass effect.  CT CERVICAL SPINE FINDINGS  No evidence of acute fracture or traumatic  malalignment.  Remote C7 and T2 vertebral body fractures with moderate height loss. No gross cervical canal hematoma or prevertebral edema.  IMPRESSION: 1. No evidence of intracranial or cervical spine injury. 2. Right frontal scalp laceration without fracture. 3. Chronic left retinal detachment.   Electronically Signed   By: Monte Fantasia M.D.   On: 02/20/2015 06:13   Ct Cervical Spine Wo Contrast  02/20/2015   CLINICAL DATA:  Fall with forehead laceration.  Lethargy.  EXAM: CT HEAD WITHOUT CONTRAST  CT CERVICAL SPINE WITHOUT CONTRAST  TECHNIQUE: Multidetector CT imaging of the head and cervical spine was performed following the standard protocol without intravenous contrast. Multiplanar CT image reconstructions of the cervical spine were also generated.  COMPARISON:  Five days ago  FINDINGS: CT HEAD FINDINGS  Skull and Sinuses:Right frontal scalp laceration appears to extend to bone. No fracture or opaque foreign body.  Orbits: No acute finding.  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 Right cataract resection.  Brain: No evidence of acute infarction, hemorrhage, hydrocephalus, or mass lesion/mass effect.  CT CERVICAL SPINE FINDINGS  No evidence of acute fracture or traumatic malalignment. Remote C7 and T2 vertebral body fractures with moderate height loss. No gross cervical canal hematoma or prevertebral edema.  IMPRESSION: 1. No evidence of intracranial or cervical spine injury. 2. Right frontal scalp laceration without fracture. 3. Chronic left retinal detachment.   Electronically Signed   By: Monte Fantasia M.D.   On: 02/20/2015 06:13   I have personally reviewed and evaluated these images and lab results as part of my medical decision-making.   EKG Interpretation   Date/Time:  Monday February 20 2015 04:00:59 EDT Ventricular Rate:  83 PR Interval:  180 QRS Duration: 89 QT Interval:  377 QTC Calculation: 443 R Axis:   -4 Text Interpretation:   Sinus rhythm Left ventricular hypertrophy Inferior  infarct, old Anterior infarct, old Confirmed by Glynn Octave  [phone]) on 02/20/2015 4:57:50 AM      MDM   Final diagnoses:  Fall, initial encounter  Laceration    Patient here for fall.  Infectious work up and EKG do not reveal cause for fall.  Likely mechanical.  Laceration was repaired. CT scans negative for injury.  I counseled the daughter on decreasing sedating medications the patient takes as a cause for the fall and she became very offended and suggested I was calling her mother a drug addict.  This certainly was not my intent.  Patient is very drowsy during ED stay, even during laceration repair.  She was advised to fu with PCP for possible home nurse aide or nursing home.  Patient appears well and in NAD.  Her VS remain within her normal limits and she is safe for DC  LACERATION REPAIR Performed by: Everlene Balls Authorized by: Everlene Balls Consent: Verbal consent obtained. Risks and benefits: risks, benefits and alternatives were discussed Consent given by: patient Patient identity confirmed: provided demographic data Prepped and Draped in normal sterile fashion Wound explored  Laceration Location: forehead, ri sde  Laceration Length: 4cm  No Foreign Bodies seen or palpated  Anesthesia: local infiltration  Local anesthetic: lidocaine 1% with epinephrine  Anesthetic total: 6 ml  Irrigation method: syringe Amount of cleaning: standard  Skin closure: 5-0 ethilon sutures  Number of sutures: 6  Technique: simple interrupted  Patient tolerance: Patient tolerated the procedure well with no immediate complications.    Everlene Balls, MD 02/20/15 [phone]

## 2015-02-20 NOTE — ED Notes (Signed)
Per EMS, pt fell tonight. Pt's daughter found the pt in her bed with blood on her head at 2:15, unsure of when last seen normal. Pt with laceration to forehead. Pt had taken 7.5-325 Percocet, a few hours before bed, and a trazodone and xanax before bed. Pt's daughter reports that she has syncope frequently. Pt has fallen twice tonight. Pt c/o head pain, thoracic spine pain, right hand pain. Pt with hx of HTN, TIA and C7 fracture.

## 2015-02-21 ENCOUNTER — Encounter: Payer: Self-pay | Admitting: Diagnostic Neuroimaging

## 2015-02-21 ENCOUNTER — Ambulatory Visit (INDEPENDENT_AMBULATORY_CARE_PROVIDER_SITE_OTHER): Payer: Medicare Other | Admitting: Diagnostic Neuroimaging

## 2015-02-21 VITALS — BP 141/95 | HR 76 | Ht 65.0 in | Wt 181.0 lb

## 2015-02-21 DIAGNOSIS — R42 Dizziness and giddiness: Secondary | ICD-10-CM | POA: Diagnosis not present

## 2015-02-21 DIAGNOSIS — F0390 Unspecified dementia without behavioral disturbance: Secondary | ICD-10-CM | POA: Diagnosis not present

## 2015-02-21 DIAGNOSIS — R269 Unspecified abnormalities of gait and mobility: Secondary | ICD-10-CM

## 2015-02-21 DIAGNOSIS — F03A0 Unspecified dementia, mild, without behavioral disturbance, psychotic disturbance, mood disturbance, and anxiety: Secondary | ICD-10-CM

## 2015-02-21 NOTE — Progress Notes (Signed)
[facility]  PATIENT: [name] DOB: [birth year]  REFERRING CLINICIAN: Lysle Rubens HISTORY FROM: patient, daughter REASON FOR VISIT: follow up   HISTORICAL  CHIEF COMPLAINT:  Chief Complaint  Patient presents with  . Headache    rm 7, New Patient, daughterTye [place]    HISTORY OF PRESENT ILLNESS:   UPDATE 02/21/15: Since last visit, continues to have balance diff and falls. Continues to have memory loss, headaches, neck pain, tremors. Has had multiple ER visits.   UPDATE 12/06/14 (VRP): Since last visit, had more syncope events, falls, now with compression fractures in spine (C7, T2, T3). Now with more neck pain, right ear pain. Seeing Dr. Saintclair Halsted, and has conservative mgmt with c-collar. Had an event in the office where she briefly slumped, and rapidly awoke. Vitals stable.   UPDATE 06/13/14 (VRP): Since last visit, still with stress, poor PO intake, another syncope event around christmas 2015 (didn't go to hospital), memory loss, staying cold all the time.   UPDATE 10/19/13 (LL): Since last visit, Home Health PT for gait and balance training; vestibular rehab was ordered and completed. TSH was normal. 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 No acute findings. No change from MRI on 03/30/12. Basically her symptoms have not changed. Daughter lives with her and helps with daily ADLs. Also helps her do vestibular exercises.  UPDATE 06/18/13 (LL): Patient returns for follow up. Daughter requests sooner revisit, patient's dizziness has not improved. She did not get labs drawn that were ordered at last visit (B12, TSH, and HgbA1C). She states her PCP checks her sugar and B12. Her dizziness is mainly brought on by quick changes in position. She states she has tried vestibular rehab exercises before and it did not help. She denies falls. She has tried Meclizine in the past with no benefit.   UPDATE 05/03/13 (VRP): Patient  presents as referral for recurrent dizziness, balance difficulty, passing out. Patient tells me that she is feeling swimmy headed, having spinning sensation with vertigo, lasting a few minutes at a time. This typically triggered by changing position. Sometimes this happens when she is sitting or without movement. Patient is also episodes of passing out, with loss of consciousness for a few minutes. No convulsions. She has prodromal lightheadedness this happens. No post ictal confusion. No tongue biting or incontinence with these episodes. Also significantly noted is poor balance and gait with intermittent falling down. Her depression and anxiety are also poorly controlled at this point. She has significant hallucinations, racing thoughts, poor sleep. Patient's memory is getting worse over time. She does not remember seeing me in October 2013.  PRIOR HPI (03/13/12): 74 year old right-handed female with history of hypertension, hypercholesterolemia, depression, anxiety, here for evaluation of dizziness. For past 6-7 years patient has been having intermittent episodes of room spinning sensation, lasting 45 seconds at a time. No nausea vomiting. Episodes typically triggered when she turns her head too quickly, tilt her head up or down. She denies any slurred speech, trouble talking, trouble swallowing, double vision. Patient had one episode while she was sitting down, without head turning movement, where she felt dizzy, spinning, and then passed out.   REVIEW OF SYSTEMS: Full 14 system review of systems performed and notable only for memory loss confusion headache passing out sleepiness restless legs wheezing feeling cold weight loss palpitations.     ALLERGIES: Allergies  Allergen Reactions  . Ampicillin Nausea Only and Other (See Comments)    Thrush.   Marland Kitchen  Oxycodone-Acetaminophen Itching    Can take with benadryl  . Toradol [Ketorolac Tromethamine] Nausea Only and Other (See Comments)    Headaches, back  aches  . Ambien [Zolpidem Tartrate] Other (See Comments)    unknown  . Ampicillin-Sulbactam Sodium Other (See Comments)    Muscles draw up tight - severe headache  . Tape Other (See Comments)    Paper tape only.  . Cephalexin Other (See Comments)    unknown  . Percocet [Oxycodone-Acetaminophen] Itching and Rash    Vicodin is OK  . Sulfamethoxazole-Trimethoprim Other (See Comments)    unknown  . Tramadol Rash and Other (See Comments)    Headache.     HOME MEDICATIONS: Outpatient Prescriptions Prior to Visit  Medication Sig Dispense Refill  . albuterol (ACCUNEB) 0.63 MG/3ML nebulizer solution Take 1 ampule by nebulization every 6 (six) hours as needed for wheezing.    Marland Kitchen albuterol (PROVENTIL HFA;VENTOLIN HFA) 108 (90 BASE) MCG/ACT inhaler Inhale 1 puff into the lungs every 6 (six) hours as needed for wheezing or shortness of breath.    Marland Kitchen albuterol (PROVENTIL) (2.5 MG/3ML) 0.083% nebulizer solution Take 2.5 mg by nebulization every 6 (six) hours as needed for wheezing or shortness of breath.    . ALPRAZolam (XANAX) 1 MG tablet Take 1 mg by mouth at bedtime.    Marland Kitchen amLODipine (NORVASC) 5 MG tablet Take 2.5 mg by mouth 2 (two) times daily.     Marland Kitchen dicyclomine (BENTYL) 20 MG tablet Take 20 mg by mouth daily as needed for spasms.     . DOCUSATE SODIUM PO Take 1 capsule by mouth daily.    . DULoxetine (CYMBALTA) 30 MG capsule Take 30 mg by mouth daily.    Marland Kitchen esomeprazole (NEXIUM) 40 MG capsule Take 40 mg by mouth daily.     Marland Kitchen gabapentin (NEURONTIN) 300 MG capsule Take 300 mg by mouth 3 (three) times daily.    Marland Kitchen HYDROcodone-acetaminophen (NORCO) 7.5-325 MG per tablet Take 1-2 tablets by mouth every 6 (six) hours as needed for moderate pain.    . IRON PO Take 1 tablet by mouth daily.    Marland Kitchen KRILL OIL PO Take 1 tablet by mouth daily.    Marland Kitchen lovastatin (MEVACOR) 10 MG tablet Take 10 mg by mouth daily.    . meclizine (ANTIVERT) 12.5 MG tablet Take 12.5 mg by mouth 3 (three) times daily as needed for  nausea.    . polyethylene glycol powder (GLYCOLAX/MIRALAX) powder Take 17 g by mouth daily. For constipation    . senna (SENOKOT) 8.6 MG tablet Take 1-2 tablets by mouth daily.     Marland Kitchen tiZANidine (ZANAFLEX) 4 MG capsule Take 2 mg by mouth at bedtime.     . traZODone (DESYREL) 50 MG tablet Take 0.5 tablets (25 mg total) by mouth at bedtime. 30 tablet 6  . vitamin B-12 (CYANOCOBALAMIN) 1000 MCG tablet Take 1,000 mcg by mouth daily.    . famotidine (PEPCID) 20 MG tablet Take 1 tablet (20 mg total) by mouth 2 (two) times daily. 10 tablet 0  . citalopram (CELEXA) 40 MG tablet Take 20 mg by mouth every morning.    . diazepam (VALIUM) 2 MG tablet Take 1 tablet (2 mg total) by mouth every 4 (four) hours as needed for muscle spasms. (Patient not taking: Reported on 02/15/2015) 15 tablet 0  . diphenhydrAMINE (BENADRYL) 25 MG tablet Take 1 tablet (25 mg total) by mouth every 6 (six) hours. (Patient not taking: Reported on 02/15/2015) 12 tablet 0  .  doxycycline (VIBRAMYCIN) 100 MG capsule Take 1 capsule (100 mg total) by mouth 2 (two) times daily. (Patient not taking: Reported on 02/15/2015) 14 capsule 0  . nystatin (MYCOSTATIN) 100000 UNIT/ML suspension Take 5 mLs by mouth daily as needed. For mouth sores per daughter Swish and spit.    Marland Kitchen oxyCODONE-acetaminophen (PERCOCET/ROXICET) 5-325 MG per tablet Take 1 tablet by mouth every 6 (six) hours as needed for severe pain. (Patient not taking: Reported on 02/15/2015) 10 tablet 0  . predniSONE (DELTASONE) 10 MG tablet '60mg'$  PO x 3 days, 40 mg PO x 3 days, 20 mg PO x 2 days, 10 mg PO 2 days (Patient not taking: Reported on 02/15/2015) 36 tablet 0  . promethazine (PHENERGAN) 12.5 MG suppository Place 12.5 mg rectally every 6 (six) hours as needed for nausea or vomiting.    . Soft Lens Products (RA REWETTING DROPS) SOLN 1 application by Does not apply route daily.     No facility-administered medications prior to visit.    PAST MEDICAL HISTORY: Past Medical History    Diagnosis Date  . Hiatal hernia   . Anemia   . GERD (gastroesophageal reflux disease)   . Hyperlipidemia   . Depression     major  . Stroke     "mini stroke"  . Osteoarthritis   . Reactive airway disease   . B12 deficiency   . Osteoporosis   . Rupture of bowel   . Bladder incontinence   . DDD (degenerative disc disease), lumbar   . Cataract   . COPD (chronic obstructive pulmonary disease)   . Hypertension   . Stroke   . Macular degeneration   . Blind left eye   . Cervical spine fracture   . Compression fracture     C7,  upper T spine -compression fx    PAST SURGICAL HISTORY: Past Surgical History  Procedure Laterality Date  . Abdominal surgery    . Colon surgery    . Joint replacement    . Appendectomy    . Bladder surgery    . Rectocele repair    . Knee surgery    . Cholecystectomy    . Abdominal hysterectomy    . Ostomy    . Eye surgery      cataracts    FAMILY HISTORY: Family History  Problem Relation Age of Onset  . Heart failure Mother   . Heart attack Father     SOCIAL HISTORY:  Social History   Social History  . Marital Status: Single    Spouse Name: N/A  . Number of Children: 3  . Years of Education: middle sch   Occupational History  . retired     n/a   Social History Main Topics  . Smoking status: Former Smoker -- 1.50 packs/day for 30 years  . Smokeless tobacco: Not on file     Comment: quit 2013  . Alcohol Use: No  . Drug Use: No  . Sexual Activity: No   Other Topics Concern  . Not on file   Social History Narrative   ** Merged History Encounter **       Patient lives at home with daughter. Caffeine Use: 4 cups daily     PHYSICAL EXAM  Filed Vitals:   02/21/15 0912  BP: 141/95  Pulse: 76  Height: '5\' 5"'$  (1.651 m)  Weight: 181 lb (82.101 kg)    Not recorded      Body mass index is 30.12 kg/(m^2).  MMSE -  Mini Mental State Exam 12/06/2014 12/06/2014  Orientation to time - 4  Orientation to Place - 4   Registration - 3  Attention/ Calculation - 0  Recall - 1  Language- name 2 objects - 2  Language- repeat - 0  Language- follow 3 step command - 3  Language- read & follow direction - 1  Write a sentence (No Data) 0  Write a sentence-comments unable to see well enough -  Copy design (No Data) 0  Copy design-comments unable to see well enough -  Total score - 18      GENERAL EXAM: Patient is in no distress; well developed, nourished and groomed  CARDIOVASCULAR: Regular rate and rhythm, no murmurs, no carotid bruits  NEUROLOGIC: MENTAL STATUS: awake, alert, language fluent, comprehension intact, naming intact, fund of knowledge appropriate CRANIAL NERVE: pupils equal and reactive to light, visual fields full to confrontation, extraocular muscles intact, no nystagmus, facial sensation and strength symmetric, hearing intact, palate elevates symmetrically, uvula midline, shoulder shrug symmetric, tongue midline. MOTOR: normal bulk and tone, full strength in the BUE, BLE SENSORY: normal and symmetric to light touch, temperature, vibration COORDINATION: finger-nose-finger --> DYSMETRIA REFLEXES: deep tendon reflexes present and symmetric GAIT/STATION: UNSTEADY GAIT    DIAGNOSTIC DATA (LABS, IMAGING, TESTING) - I reviewed patient records, labs, notes, testing and imaging myself where available.  Lab Results  Component Value Date   WBC 6.6 02/20/2015   HGB 15.6* 02/20/2015   HCT 46.0 02/20/2015   MCV 90.5 02/20/2015   PLT 189 02/20/2015      Component Value Date/Time   NA 145 02/20/2015 0550   K 3.3* 02/20/2015 0550   CL 104 02/20/2015 0550   CO2 25 10/25/2014 2034   GLUCOSE 105* 02/20/2015 0550   BUN 10 02/20/2015 0550   CREATININE 1.10* 02/20/2015 0550   CALCIUM 9.2 10/25/2014 2034   PROT 7.4 10/25/2014 2034   ALBUMIN 3.5 10/25/2014 2034   AST 30 10/25/2014 2034   ALT 21 10/25/2014 2034   ALKPHOS 115 10/25/2014 2034   BILITOT 0.5 10/25/2014 2034   GFRNONAA 41*  10/25/2014 2034   GFRAA 48* 10/25/2014 2034   No results found for: CHOL Lab Results  Component Value Date   HGBA1C 5.6 10/05/2014   No results found for: [id number] Lab Results  Component Value Date   TSH 1.351 07/30/2013    03/30/12 MRI BRAIN - 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 There mild changes of chronic microvascular ischemia and generalized cerebral atrophy.  03/30/12 MRA head - mild atheromatous changes in the terminal branch vessels.  03/30/12 MRA neck - normal  06/11/13 MRI brain (without) demonstrating: 1. Few punctate foci of non-specific gliosis in the periventricular and subcortical white matter, likely chronic small vessel ischemic disease. 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 2. No acute findings.  10/08/14 MRI CERVICAL SPINE: Acute moderate C7 compression fracture. No malalignment. 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 Multilevel neural foraminal narrowing: Moderate to severe on the LEFT at C4-5 and C5-6.  10/08/14 MRI THORACIC SPINE: Acute moderate T2 compression fracture. Mild acute T3 compression fracture. No malalignment.    ASSESSMENT AND PLAN  74 y.o. year old female here with constellation of issues/symptoms, likely related to increased stress and decreased PO intake. No clear unifying neurologic diagnosis. Also with compression fracture and neck / right ear pain since May 2016. Also with memory loss, suspicious  for dementia. Also with significant polypharmacy, depression, poor PO intake, post-traumatic headaches.   "Dizziness" (balance difficulty + vertigo + syncope)  Memory loss - MMSE 28/30 (05/03/13) --> 18/30 (12/06/14)  Headaches  Depression  Anxiety  Poor PO intake  Gastroparesis    PLAN: I spent 40 minutes of face to face time with patient. Greater than 50% of time was spent in counseling and coordination of care  with patient. In summary we discussed:  - advised to focus on physical activity in daytime, proper sleep at night, small frequent / regular meals (currently eating once a day), increased water intake - recommend pain mgmt via PCP or pain mgmt clinic (see PCP for referral)  No Follow-up on file.    Penni Bombard, MD 2/77/4128, 7:86 AM Certified in Neurology, Neurophysiology and Neuroimaging  [facility] [address] [place], [place] [address] [phone]   [facility]  PATIENT: [name] DOB: [birth year]  REFERRING CLINICIAN: Lysle Rubens HISTORY FROM: patient., daughter REASON FOR VISIT: follow up   HISTORICAL  CHIEF COMPLAINT:  Chief Complaint  Patient presents with  . Headache    rm 7, New Patient, daughterTye [place]    HISTORY OF PRESENT ILLNESS:   UPDATE 12/06/14 (VRP): Since last visit, had more syncope events, falls, now with compression fractures in spine (C7, T2, T3). Now with more neck pain, right ear pain. Seeing Dr. Saintclair Halsted, and has conservative mgmt with c-collar. Had an event in the office where she briefly slumped, and rapidly awoke. Vitals stable.   UPDATE 06/13/14 (VRP): Since last visit, still with stress, poor PO intake, another syncope event around christmas 2015 (didn't go to hospital), memory loss, staying cold all the time.   UPDATE 10/19/13 (LL): Since last visit, Home Health PT for gait and balance training; vestibular rehab was ordered and completed. TSH was normal. 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 No acute findings. No change from MRI on 03/30/12. Basically her symptoms have not changed. Daughter lives with her and helps with daily ADLs. Also helps her do vestibular exercises.  UPDATE 06/18/13 (LL): Patient returns for follow up. Daughter requests sooner revisit, patient's dizziness has not improved. She did not get labs drawn that were  ordered at last visit (B12, TSH, and HgbA1C). She states her PCP checks her sugar and B12. Her dizziness is mainly brought on by quick changes in position. She states she has tried vestibular rehab exercises before and it did not help. She denies falls. She has tried Meclizine in the past with no benefit.   UPDATE 05/03/13 (VRP): Patient presents as referral for recurrent dizziness, balance difficulty, passing out. Patient tells me that she is feeling swimmy headed, having spinning sensation with vertigo, lasting a few minutes at a time. This typically triggered by changing position. Sometimes this happens when she is sitting or without movement. Patient is also episodes of passing out, with loss of consciousness for a few minutes. No convulsions. She has prodromal lightheadedness this happens. No post ictal confusion. No tongue biting or incontinence with these episodes. Also significantly noted is poor balance and gait with intermittent falling down. Her depression and anxiety are also poorly controlled at this point. She has significant hallucinations, racing thoughts, poor sleep. Patient's memory is getting worse over time. She does not remember seeing me in October 2013.  PRIOR HPI (03/13/12): 74 year old right-handed female with history of hypertension, hypercholesterolemia, depression, anxiety, here for evaluation  of dizziness. For past 6-7 years patient has been having intermittent episodes of room spinning sensation, lasting 45 seconds at a time. No nausea vomiting. Episodes typically triggered when she turns her head too quickly, tilt her head up or down. She denies any slurred speech, trouble talking, trouble swallowing, double vision. Patient had one episode while she was sitting down, without head turning movement, where she felt dizzy, spinning, and then passed out.   REVIEW OF SYSTEMS: Full 14 system review of systems performed and notable only for decreased activity chills fatigue wheezing  shortness of breath constipation nausea vomiting chest pain insomnia restless leg snoring sleep talking joint pain back pain bladder incontinence memory loss dizziness headache numbness weakness passing out depression.   ALLERGIES: Allergies  Allergen Reactions  . Ampicillin Nausea Only and Other (See Comments)    Thrush.   . Oxycodone-Acetaminophen Itching    Can take with benadryl  . Toradol [Ketorolac Tromethamine] Nausea Only and Other (See Comments)    Headaches, back aches  . Ambien [Zolpidem Tartrate] Other (See Comments)    unknown  . Ampicillin-Sulbactam Sodium Other (See Comments)    Muscles draw up tight - severe headache  . Tape Other (See Comments)    Paper tape only.  . Cephalexin Other (See Comments)    unknown  . Percocet [Oxycodone-Acetaminophen] Itching and Rash    Vicodin is OK  . Sulfamethoxazole-Trimethoprim Other (See Comments)    unknown  . Tramadol Rash and Other (See Comments)    Headache.     HOME MEDICATIONS: Outpatient Prescriptions Prior to Visit  Medication Sig Dispense Refill  . albuterol (ACCUNEB) 0.63 MG/3ML nebulizer solution Take 1 ampule by nebulization every 6 (six) hours as needed for wheezing.    Marland Kitchen albuterol (PROVENTIL HFA;VENTOLIN HFA) 108 (90 BASE) MCG/ACT inhaler Inhale 1 puff into the lungs every 6 (six) hours as needed for wheezing or shortness of breath.    Marland Kitchen albuterol (PROVENTIL) (2.5 MG/3ML) 0.083% nebulizer solution Take 2.5 mg by nebulization every 6 (six) hours as needed for wheezing or shortness of breath.    . ALPRAZolam (XANAX) 1 MG tablet Take 1 mg by mouth at bedtime.    Marland Kitchen amLODipine (NORVASC) 5 MG tablet Take 2.5 mg by mouth 2 (two) times daily.     Marland Kitchen dicyclomine (BENTYL) 20 MG tablet Take 20 mg by mouth daily as needed for spasms.     . DOCUSATE SODIUM PO Take 1 capsule by mouth daily.    . DULoxetine (CYMBALTA) 30 MG capsule Take 30 mg by mouth daily.    Marland Kitchen esomeprazole (NEXIUM) 40 MG capsule Take 40 mg by mouth daily.      Marland Kitchen gabapentin (NEURONTIN) 300 MG capsule Take 300 mg by mouth 3 (three) times daily.    Marland Kitchen HYDROcodone-acetaminophen (NORCO) 7.5-325 MG per tablet Take 1-2 tablets by mouth every 6 (six) hours as needed for moderate pain.    . IRON PO Take 1 tablet by mouth daily.    Marland Kitchen KRILL OIL PO Take 1 tablet by mouth daily.    Marland Kitchen lovastatin (MEVACOR) 10 MG tablet Take 10 mg by mouth daily.    . meclizine (ANTIVERT) 12.5 MG tablet Take 12.5 mg by mouth 3 (three) times daily as needed for nausea.    . polyethylene glycol powder (GLYCOLAX/MIRALAX) powder Take 17 g by mouth daily. For constipation    . senna (SENOKOT) 8.6 MG tablet Take 1-2 tablets by mouth daily.     Marland Kitchen tiZANidine (ZANAFLEX) 4  MG capsule Take 2 mg by mouth at bedtime.     . traZODone (DESYREL) 50 MG tablet Take 0.5 tablets (25 mg total) by mouth at bedtime. 30 tablet 6  . vitamin B-12 (CYANOCOBALAMIN) 1000 MCG tablet Take 1,000 mcg by mouth daily.    . famotidine (PEPCID) 20 MG tablet Take 1 tablet (20 mg total) by mouth 2 (two) times daily. 10 tablet 0  . citalopram (CELEXA) 40 MG tablet Take 20 mg by mouth every morning.    . diazepam (VALIUM) 2 MG tablet Take 1 tablet (2 mg total) by mouth every 4 (four) hours as needed for muscle spasms. (Patient not taking: Reported on 02/15/2015) 15 tablet 0  . diphenhydrAMINE (BENADRYL) 25 MG tablet Take 1 tablet (25 mg total) by mouth every 6 (six) hours. (Patient not taking: Reported on 02/15/2015) 12 tablet 0  . doxycycline (VIBRAMYCIN) 100 MG capsule Take 1 capsule (100 mg total) by mouth 2 (two) times daily. (Patient not taking: Reported on 02/15/2015) 14 capsule 0  . nystatin (MYCOSTATIN) 100000 UNIT/ML suspension Take 5 mLs by mouth daily as needed. For mouth sores per daughter Swish and spit.    Marland Kitchen oxyCODONE-acetaminophen (PERCOCET/ROXICET) 5-325 MG per tablet Take 1 tablet by mouth every 6 (six) hours as needed for severe pain. (Patient not taking: Reported on 02/15/2015) 10 tablet 0  . predniSONE  (DELTASONE) 10 MG tablet '60mg'$  PO x 3 days, 40 mg PO x 3 days, 20 mg PO x 2 days, 10 mg PO 2 days (Patient not taking: Reported on 02/15/2015) 36 tablet 0  . promethazine (PHENERGAN) 12.5 MG suppository Place 12.5 mg rectally every 6 (six) hours as needed for nausea or vomiting.    . Soft Lens Products (RA REWETTING DROPS) SOLN 1 application by Does not apply route daily.     No facility-administered medications prior to visit.    PAST MEDICAL HISTORY: Past Medical History  Diagnosis Date  . Hiatal hernia   . Anemia   . GERD (gastroesophageal reflux disease)   . Hyperlipidemia   . Depression     major  . Stroke     "mini stroke"  . Osteoarthritis   . Reactive airway disease   . B12 deficiency   . Osteoporosis   . Rupture of bowel   . Bladder incontinence   . DDD (degenerative disc disease), lumbar   . Cataract   . COPD (chronic obstructive pulmonary disease)   . Hypertension   . Stroke   . Macular degeneration   . Blind left eye   . Cervical spine fracture   . Compression fracture     C7,  upper T spine -compression fx    PAST SURGICAL HISTORY: Past Surgical History  Procedure Laterality Date  . Abdominal surgery    . Colon surgery    . Joint replacement    . Appendectomy    . Bladder surgery    . Rectocele repair    . Knee surgery    . Cholecystectomy    . Abdominal hysterectomy    . Ostomy    . Eye surgery      cataracts    FAMILY HISTORY: Family History  Problem Relation Age of Onset  . Heart failure Mother   . Heart attack Father     SOCIAL HISTORY:  Social History   Social History  . Marital Status: Single    Spouse Name: N/A  . Number of Children: 3  . Years of Education: middle sch  Occupational History  . retired     n/a   Social History Main Topics  . Smoking status: Former Smoker -- 1.50 packs/day for 30 years  . Smokeless tobacco: Not on file     Comment: quit 2013  . Alcohol Use: No  . Drug Use: No  . Sexual Activity: No    Other Topics Concern  . Not on file   Social History Narrative   ** Merged History Encounter **       Patient lives at home with daughter. Caffeine Use: 4 cups daily     PHYSICAL EXAM  Filed Vitals:   02/21/15 0912  BP: 141/95  Pulse: 76  Height: '5\' 5"'$  (1.651 m)  Weight: 181 lb (82.101 kg)    Not recorded      Body mass index is 30.12 kg/(m^2).  MMSE - Mini Mental State Exam 12/06/2014 12/06/2014  Orientation to time - 4  Orientation to Place - 4  Registration - 3  Attention/ Calculation - 0  Recall - 1  Language- name 2 objects - 2  Language- repeat - 0  Language- follow 3 step command - 3  Language- read & follow direction - 1  Write a sentence (No Data) 0  Write a sentence-comments unable to see well enough -  Copy design (No Data) 0  Copy design-comments unable to see well enough -  Total score - 18      GENERAL EXAM: Patient is in no distress; well developed, nourished and groomed  CARDIOVASCULAR: Regular rate and rhythm, no murmurs, no carotid bruits  NEUROLOGIC: MENTAL STATUS: awake, alert, language fluent, comprehension intact, naming intact, fund of knowledge appropriate CRANIAL NERVE: pupils equal and reactive to light, visual fields full to confrontation, extraocular muscles intact, no nystagmus, facial sensation and strength symmetric, hearing intact, palate elevates symmetrically, uvula midline, shoulder shrug symmetric, tongue midline. MOTOR: normal bulk and tone, full strength in the BUE, BLE; MILD POSTURAL AND ACTION TREMOR SENSORY: normal and symmetric to light touch, temperature, vibration COORDINATION: finger-nose-finger --> DYSMETRIA REFLEXES: deep tendon reflexes present and symmetric GAIT/STATION: UNSTEADY GAIT; USING A WALKER    DIAGNOSTIC DATA (LABS, IMAGING, TESTING) - I reviewed patient records, labs, notes, testing and imaging myself where available.  Lab Results  Component Value Date   WBC 6.6 02/20/2015   HGB 15.6*  02/20/2015   HCT 46.0 02/20/2015   MCV 90.5 02/20/2015   PLT 189 02/20/2015      Component Value Date/Time   NA 145 02/20/2015 0550   K 3.3* 02/20/2015 0550   CL 104 02/20/2015 0550   CO2 25 10/25/2014 2034   GLUCOSE 105* 02/20/2015 0550   BUN 10 02/20/2015 0550   CREATININE 1.10* 02/20/2015 0550   CALCIUM 9.2 10/25/2014 2034   PROT 7.4 10/25/2014 2034   ALBUMIN 3.5 10/25/2014 2034   AST 30 10/25/2014 2034   ALT 21 10/25/2014 2034   ALKPHOS 115 10/25/2014 2034   BILITOT 0.5 10/25/2014 2034   GFRNONAA 41* 10/25/2014 2034   GFRAA 48* 10/25/2014 2034   No results found for: CHOL Lab Results  Component Value Date   HGBA1C 5.6 10/05/2014   No results found for: [id number] Lab Results  Component Value Date   TSH 1.351 07/30/2013    03/30/12 MRI BRAIN - 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 There mild changes of chronic microvascular ischemia and generalized cerebral atrophy.  03/30/12 MRA head - mild atheromatous changes in the terminal branch  vessels.  03/30/12 MRA neck - normal  06/11/13 MRI brain (without) demonstrating: 1. Few punctate foci of non-specific gliosis in the periventricular and subcortical white matter, likely chronic small vessel ischemic disease. 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 2. No acute findings.  10/08/14 MRI CERVICAL SPINE: Acute moderate C7 compression fracture. No malalignment. 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 Multilevel neural foraminal narrowing: Moderate to severe on the LEFT at C4-5 and C5-6.  10/08/14 MRI THORACIC SPINE: Acute moderate T2 compression fracture. Mild acute T3 compression fracture. No malalignment.  02/20/15 CT head / cervical spine 1. No evidence of intracranial or cervical spine injury. 2. Right frontal scalp laceration without fracture. 3. Chronic left retinal detachment.     ASSESSMENT AND  PLAN  74 y.o. year old female here with constellation of issues/symptoms, including gait diff, recurrent falls, dizziness, memory loss, depression, likely related to increased stress, decreased PO intake, polypharmacy, prior strokes, dementia, arthritis, deconditioning and poor judgement/insight.     "Dizziness" (balance difficulty + vertigo + syncope)  Memory loss - MMSE 28/30 (05/03/13)--> 18/30 (12/06/14)  Post-traumatic and cervicogenic headaches  Depression  Anxiety  Poor PO intake  Gastroparesis    PLAN: I spent 40 minutes of face to face time with patient. Greater than 50% of time was spent in counseling and coordination of care with patient. In summary we discussed:  - advised to focus on gentle / supervised physical activity in daytime, proper sleep at night, small frequent / regular meals,, increased water intake - recommend to follow up with PCP re: polypharmacy and recurrent syncope / falls; this is not due to meningitis or seizure as queried by daughter and PCP - no further neurologic testing advised - recommend more safety / supervision at home; patient and daughter decline home health aid or placement  Return for return to PCP.    Penni Bombard, MD 5/78/4696, 2:95 AM Certified in Neurology, Neurophysiology and Neuroimaging  [facility] [address], [place], [place] [address] [phone]

## 2015-02-21 NOTE — Patient Instructions (Signed)
Thank you for coming to see [place] at [facility]. I hope we have been able to provide you high quality care today.  You may receive a patient satisfaction survey over the next few weeks. We would appreciate your feedback and comments so that we may continue to improve ourselves and the health of our patients.  - follow up with Dr. Lysle Rubens - drink more fluids

## 2015-02-23 DIAGNOSIS — T148 Other injury of unspecified body region: Secondary | ICD-10-CM | POA: Diagnosis not present

## 2015-02-23 DIAGNOSIS — G8929 Other chronic pain: Secondary | ICD-10-CM | POA: Diagnosis not present

## 2015-03-01 DIAGNOSIS — Z4802 Encounter for removal of sutures: Secondary | ICD-10-CM | POA: Diagnosis not present

## 2015-03-01 DIAGNOSIS — G8929 Other chronic pain: Secondary | ICD-10-CM | POA: Diagnosis not present

## 2015-03-08 DIAGNOSIS — M810 Age-related osteoporosis without current pathological fracture: Secondary | ICD-10-CM | POA: Diagnosis not present

## 2015-03-12 ENCOUNTER — Encounter (HOSPITAL_COMMUNITY): Payer: Self-pay | Admitting: *Deleted

## 2015-03-12 ENCOUNTER — Emergency Department (HOSPITAL_COMMUNITY)
Admission: EM | Admit: 2015-03-12 | Discharge: 2015-03-12 | Disposition: A | Discharge status: Home / Self Care | Payer: Medicare Other | Attending: Emergency Medicine | Admitting: Emergency Medicine

## 2015-03-12 ENCOUNTER — Emergency Department (HOSPITAL_COMMUNITY): Payer: Medicare Other

## 2015-03-12 DIAGNOSIS — E538 Deficiency of other specified B group vitamins: Secondary | ICD-10-CM | POA: Diagnosis not present

## 2015-03-12 DIAGNOSIS — K219 Gastro-esophageal reflux disease without esophagitis: Secondary | ICD-10-CM | POA: Insufficient documentation

## 2015-03-12 DIAGNOSIS — Y9289 Other specified places as the place of occurrence of the external cause: Secondary | ICD-10-CM | POA: Insufficient documentation

## 2015-03-12 DIAGNOSIS — W1839XA Other fall on same level, initial encounter: Secondary | ICD-10-CM | POA: Insufficient documentation

## 2015-03-12 DIAGNOSIS — M1990 Unspecified osteoarthritis, unspecified site: Secondary | ICD-10-CM | POA: Insufficient documentation

## 2015-03-12 DIAGNOSIS — W19XXXA Unspecified fall, initial encounter: Secondary | ICD-10-CM

## 2015-03-12 DIAGNOSIS — Z8673 Personal history of transient ischemic attack (TIA), and cerebral infarction without residual deficits: Secondary | ICD-10-CM | POA: Insufficient documentation

## 2015-03-12 DIAGNOSIS — M25532 Pain in left wrist: Secondary | ICD-10-CM | POA: Diagnosis not present

## 2015-03-12 DIAGNOSIS — Y998 Other external cause status: Secondary | ICD-10-CM | POA: Diagnosis not present

## 2015-03-12 DIAGNOSIS — D649 Anemia, unspecified: Secondary | ICD-10-CM | POA: Insufficient documentation

## 2015-03-12 DIAGNOSIS — I10 Essential (primary) hypertension: Secondary | ICD-10-CM | POA: Diagnosis not present

## 2015-03-12 DIAGNOSIS — Z8669 Personal history of other diseases of the nervous system and sense organs: Secondary | ICD-10-CM | POA: Diagnosis not present

## 2015-03-12 DIAGNOSIS — Y9389 Activity, other specified: Secondary | ICD-10-CM | POA: Insufficient documentation

## 2015-03-12 DIAGNOSIS — E785 Hyperlipidemia, unspecified: Secondary | ICD-10-CM | POA: Insufficient documentation

## 2015-03-12 DIAGNOSIS — Z8781 Personal history of (healed) traumatic fracture: Secondary | ICD-10-CM | POA: Insufficient documentation

## 2015-03-12 DIAGNOSIS — J449 Chronic obstructive pulmonary disease, unspecified: Secondary | ICD-10-CM | POA: Insufficient documentation

## 2015-03-12 DIAGNOSIS — Z880 Allergy status to penicillin: Secondary | ICD-10-CM | POA: Insufficient documentation

## 2015-03-12 DIAGNOSIS — S6992XA Unspecified injury of left wrist, hand and finger(s), initial encounter: Secondary | ICD-10-CM | POA: Diagnosis not present

## 2015-03-12 DIAGNOSIS — Z79899 Other long term (current) drug therapy: Secondary | ICD-10-CM | POA: Insufficient documentation

## 2015-03-12 DIAGNOSIS — F329 Major depressive disorder, single episode, unspecified: Secondary | ICD-10-CM | POA: Insufficient documentation

## 2015-03-12 DIAGNOSIS — Z87891 Personal history of nicotine dependence: Secondary | ICD-10-CM | POA: Insufficient documentation

## 2015-03-12 NOTE — ED Notes (Signed)
Declined W/C at D/C and was escorted to lobby by RN. 

## 2015-03-12 NOTE — ED Provider Notes (Signed)
CSN: [id number]     Arrival date & time 03/12/15  1020 History  By signing my name below, I, [name], attest that this documentation has been prepared under the direction and in the presence of Quincy Carnes, PA-C. Electronically Signed: [name] ED Scribe. 03/12/2015. 12:05 PM.    Chief Complaint  Patient presents with  . Wrist Pain   The history is provided by the patient. No language interpreter was used.   HPI Comments: [name] is a 74 y.o. female who presents to the Emergency Department complaining of a mechanical fall in a bamboo patch last evening during which she caught herself on her left wrist   She reports constant, moderate pain in the left wrist since that is worse with movement; no treatments tried.  She denies prior injury to the wrist, LOC, head trauma.  Patient is right hand dominant.  VSS.  Past Medical History  Diagnosis Date  . Hiatal hernia   . Anemia   . GERD (gastroesophageal reflux disease)   . Hyperlipidemia   . Depression     major  . Stroke [facility])     "mini stroke"  . Osteoarthritis   . Reactive airway disease   . B12 deficiency   . Osteoporosis   . Rupture of bowel ([place])   . Bladder incontinence   . DDD (degenerative disc disease), lumbar   . Cataract   . COPD (chronic obstructive pulmonary disease) ([place])   . Hypertension   . Stroke ([place])   . Macular degeneration   . Blind left eye   . Cervical spine fracture ([place])   . Compression fracture     C7,  upper T spine -compression fx   Past Surgical History  Procedure Laterality Date  . Abdominal surgery    . Colon surgery    . Joint replacement    . Appendectomy    . Bladder surgery    . Rectocele repair    . Knee surgery    . Cholecystectomy    . Abdominal hysterectomy    . Ostomy    . Eye surgery      cataracts   Family History  Problem Relation Age of Onset  . Heart failure Mother   . Heart attack Father    Social History  Substance Use Topics  . Smoking status:  Former Smoker -- 1.50 packs/day for 30 years  . Smokeless tobacco: None     Comment: quit 2013  . Alcohol Use: No   OB History    Gravida Para Term Preterm AB TAB SAB Ectopic Multiple Living   0 0 0 0 0 0 0 0       Review of Systems A complete 10 system review of systems was obtained and all systems are negative except as noted in the HPI and PMH.    Allergies  Ampicillin; Oxycodone-acetaminophen; Toradol; Ambien; Ampicillin-sulbactam sodium; Tape; Cephalexin; Percocet; Sulfamethoxazole-trimethoprim; and Tramadol  Home Medications   Prior to Admission medications   Medication Sig Start Date End Date Taking? Authorizing Provider  albuterol (ACCUNEB) 0.63 MG/3ML nebulizer solution Take 1 ampule by nebulization every 6 (six) hours as needed for wheezing.    Historical Provider, MD  albuterol (PROVENTIL HFA;VENTOLIN HFA) 108 (90 BASE) MCG/ACT inhaler Inhale 1 puff into the lungs every 6 (six) hours as needed for wheezing or shortness of breath.    Historical Provider, MD  albuterol (PROVENTIL) (2.5 MG/3ML) 0.083% nebulizer solution Take 2.5 mg by nebulization every 6 (six)  hours as needed for wheezing or shortness of breath.    Historical Provider, MD  ALPRAZolam Duanne Moron) 1 MG tablet Take 1 mg by mouth at bedtime.    Historical Provider, MD  amLODipine (NORVASC) 5 MG tablet Take 2.5 mg by mouth 2 (two) times daily.  04/06/13   Historical Provider, MD  dicyclomine (BENTYL) 20 MG tablet Take 20 mg by mouth daily as needed for spasms.     Historical Provider, MD  DOCUSATE SODIUM PO Take 1 capsule by mouth daily.    Historical Provider, MD  DULoxetine (CYMBALTA) 30 MG capsule Take 30 mg by mouth daily.    Historical Provider, MD  esomeprazole (NEXIUM) 40 MG capsule Take 40 mg by mouth daily.     Historical Provider, MD  gabapentin (NEURONTIN) 300 MG capsule Take 300 mg by mouth 3 (three) times daily. 09/20/14   Historical Provider, MD  HYDROcodone-acetaminophen (NORCO) 7.5-325 MG per tablet Take  1-2 tablets by mouth every 6 (six) hours as needed for moderate pain.    Historical Provider, MD  IRON PO Take 1 tablet by mouth daily.    Historical Provider, MD  KRILL OIL PO Take 1 tablet by mouth daily.    Historical Provider, MD  lovastatin (MEVACOR) 10 MG tablet Take 10 mg by mouth daily. 09/22/14   Historical Provider, MD  meclizine (ANTIVERT) 12.5 MG tablet Take 12.5 mg by mouth 3 (three) times daily as needed for nausea.    Historical Provider, MD  polyethylene glycol powder (GLYCOLAX/MIRALAX) powder Take 17 g by mouth daily. For constipation    Historical Provider, MD  senna (SENOKOT) 8.6 MG tablet Take 1-2 tablets by mouth daily.     Historical Provider, MD  tiZANidine (ZANAFLEX) 4 MG capsule Take 2 mg by mouth at bedtime.     Historical Provider, MD  traZODone (DESYREL) 50 MG tablet Take 0.5 tablets (25 mg total) by mouth at bedtime. 12/30/14   Penni Bombard, MD  vitamin B-12 (CYANOCOBALAMIN) 1000 MCG tablet Take 1,000 mcg by mouth daily.    Historical Provider, MD   BP 145/80 mmHg  Pulse 8  Temp(Src) 98.6 F (37 C) (Oral)  Resp 16  SpO2 97%   Physical Exam  Constitutional: She is oriented to person, place, and time. She appears well-developed and well-nourished.  HENT:  Head: Normocephalic and atraumatic.  Mouth/Throat: Oropharynx is clear and moist.  Eyes: Conjunctivae and EOM are normal. Pupils are equal, round, and reactive to light.  Neck: Normal range of motion. Neck supple.  Cardiovascular: Normal rate, regular rhythm and normal heart sounds.   Pulmonary/Chest: Effort normal and breath sounds normal. No respiratory distress. She has no wheezes.  Musculoskeletal: Normal range of motion.  Left wrist with tenderness along dorsal aspect; no acute deformities or signs of dislocation; no snuffbox tenderness; full ROM maintained, pain noted with dorsiflexion of wrist; full ROM of all fingers; normal grip strength; strong radial pulse and cap refill; normal sensation  throughout  Neurological: She is alert and oriented to person, place, and time.  Skin: Skin is warm and dry. She is not diaphoretic.  Psychiatric: She has a normal mood and affect.  Nursing note and vitals reviewed.   ED Course  ORTHOPEDIC INJURY TREATMENT Date/Time: 03/12/2015 12:13 PM Performed by: Larene Pickett Authorized by: Larene Pickett Consent: Verbal consent obtained. Risks and benefits: risks, benefits and alternatives were discussed Consent given by: patient Patient understanding: patient states understanding of the procedure being performed Required items: required  blood products, implants, devices, and special equipment available Patient identity confirmed: verbally with patient Injury location: wrist Location details: left wrist Injury type: soft tissue Pre-procedure neurovascular assessment: neurovascularly intact Immobilization: splint Splint type: thumb spica Supplies used: aluminum splint Post-procedure neurovascular assessment: post-procedure neurovascularly intact Patient tolerance: Patient tolerated the procedure well with no immediate complications   (including critical care time)  DIAGNOSTIC STUDIES: Oxygen Saturation is 97% on RA, normal by my interpretation.    COORDINATION OF CARE:  10:29 AM Discussed plan to order imaging of the wrist.  Patient acknowledges and agrees with plan.    Labs Review Labs Reviewed - No data to display  Imaging Review Dg Wrist Complete Left  03/12/2015  CLINICAL DATA:  74 year old female with history of trauma from a fall yesterday evening complaining of pain in the left wrist. EXAM: LEFT WRIST - COMPLETE 3+ VIEW COMPARISON:  None. FINDINGS: Multiple views of the left wrist demonstrate no acute displaced fracture, subluxation, dislocation, or soft tissue abnormality. IMPRESSION: No acute radiographic abnormality of the left wrist. Electronically Signed   By: Vinnie Langton M.D.   On: 03/12/2015 11:30   I have  personally reviewed and evaluated these images and lab results as part of my medical decision-making.   EKG Interpretation None      MDM   Final diagnoses:  Left wrist injury, initial encounter  Fall, initial encounter   74 year old female with fall onto left wrist yesterday evening. No head injury or loss of consciousness. Patient was pain along dorsal left wrist. No acute deformities, swelling, or signs of dislocation noted on exam. No snuffbox tenderness. Hand is neurovascularly intact. X-ray was obtained which is negative for acute findings. Patient was placed in wrist splint for comfort. Encouraged RICE routine, tylenol/motrin for pain.  Encouraged follow-up with PCP.  Discussed plan with patient, he/she acknowledged understanding and agreed with plan of care.  Return precautions given for new or worsening symptoms.  I personally performed the services described in this documentation, which was scribed in my presence. The recorded information has been reviewed and is accurate.   Larene Pickett, PA-C 03/12/15 1214  Veryl Speak, MD 03/12/15 1447

## 2015-03-12 NOTE — ED Notes (Signed)
Pt reports falling last night and now has left wrist pain, no obv injury noted.

## 2015-03-12 NOTE — Discharge Instructions (Signed)
Your x-ray was negative for any fracture. Wear wrist brace for comfort. May take Tylenol or Motrin as needed for pain. Please follow-up with your primary care physician. Return here for any new or worsening symptoms.

## 2015-03-20 ENCOUNTER — Encounter (HOSPITAL_COMMUNITY): Payer: Self-pay | Admitting: *Deleted

## 2015-03-20 ENCOUNTER — Emergency Department (HOSPITAL_COMMUNITY)
Admission: EM | Admit: 2015-03-20 | Discharge: 2015-03-20 | Disposition: A | Discharge status: Home / Self Care | Payer: Medicare Other | Attending: Emergency Medicine | Admitting: Emergency Medicine

## 2015-03-20 DIAGNOSIS — Z8673 Personal history of transient ischemic attack (TIA), and cerebral infarction without residual deficits: Secondary | ICD-10-CM | POA: Insufficient documentation

## 2015-03-20 DIAGNOSIS — E785 Hyperlipidemia, unspecified: Secondary | ICD-10-CM | POA: Diagnosis not present

## 2015-03-20 DIAGNOSIS — M25532 Pain in left wrist: Secondary | ICD-10-CM | POA: Diagnosis not present

## 2015-03-20 DIAGNOSIS — Z862 Personal history of diseases of the blood and blood-forming organs and certain disorders involving the immune mechanism: Secondary | ICD-10-CM | POA: Insufficient documentation

## 2015-03-20 DIAGNOSIS — Z79899 Other long term (current) drug therapy: Secondary | ICD-10-CM | POA: Diagnosis not present

## 2015-03-20 DIAGNOSIS — K219 Gastro-esophageal reflux disease without esophagitis: Secondary | ICD-10-CM | POA: Diagnosis not present

## 2015-03-20 DIAGNOSIS — Z792 Long term (current) use of antibiotics: Secondary | ICD-10-CM | POA: Insufficient documentation

## 2015-03-20 DIAGNOSIS — Z880 Allergy status to penicillin: Secondary | ICD-10-CM | POA: Diagnosis not present

## 2015-03-20 DIAGNOSIS — H5442 Blindness, left eye, normal vision right eye: Secondary | ICD-10-CM | POA: Diagnosis not present

## 2015-03-20 DIAGNOSIS — Z8781 Personal history of (healed) traumatic fracture: Secondary | ICD-10-CM | POA: Diagnosis not present

## 2015-03-20 DIAGNOSIS — J449 Chronic obstructive pulmonary disease, unspecified: Secondary | ICD-10-CM | POA: Insufficient documentation

## 2015-03-20 DIAGNOSIS — F329 Major depressive disorder, single episode, unspecified: Secondary | ICD-10-CM | POA: Diagnosis not present

## 2015-03-20 DIAGNOSIS — Z87891 Personal history of nicotine dependence: Secondary | ICD-10-CM | POA: Diagnosis not present

## 2015-03-20 NOTE — ED Notes (Signed)
PT is here with left wrist pain and swelling and states was seen here previously and was told no fracture.  Pt states easy to get infection.  Pt had brace on.

## 2015-03-20 NOTE — ED Provider Notes (Signed)
CSN: [id number]     Arrival date & time 03/20/15  1121 History  By signing my name below, I, Essence Howell, attest that this documentation has been prepared under the direction and in the presence of Alvina Chou, PA-C Electronically Signed: Ladene Artist, ED Scribe 03/20/2015 at 1:42 PM.   Chief Complaint  Patient presents with  . Wrist Pain   The history is provided by the patient. No language interpreter was used.   HPI Comments: [name] is a 74 y.o. female who presents to the Emergency Department complaining of gradually worsening, constant left wrist pain for the past week. Pt was seen in the ED on 03/12/15; XRs were obtained that were negative. She denies recent injury. She reports worsening pain that is exacerbated with movement. Pt also reports associated swelling to the area for the past week. She has been compliant with wearing the wrist brace and taking Norco 7.5 prescribed by her PCP Dr. Lysle Rubens; her next appointment is tomorrow. Pt is on a pain contract. She has also tried ice and elevation without relief.    Past Medical History  Diagnosis Date  . Hiatal hernia   . Anemia   . GERD (gastroesophageal reflux disease)   . Hyperlipidemia   . Depression     major  . Stroke [facility])     "mini stroke"  . Osteoarthritis   . Reactive airway disease   . B12 deficiency   . Osteoporosis   . Rupture of bowel ([place])   . Bladder incontinence   . DDD (degenerative disc disease), lumbar   . Cataract   . COPD (chronic obstructive pulmonary disease) ([place])   . Hypertension   . Stroke ([place])   . Macular degeneration   . Blind left eye   . Cervical spine fracture ([place])   . Compression fracture     C7,  upper T spine -compression fx   Past Surgical History  Procedure Laterality Date  . Abdominal surgery    . Colon surgery    . Joint replacement    . Appendectomy    . Bladder surgery    . Rectocele repair    . Knee surgery    . Cholecystectomy    . Abdominal  hysterectomy    . Ostomy    . Eye surgery      cataracts   Family History  Problem Relation Age of Onset  . Heart failure Mother   . Heart attack Father    Social History  Substance Use Topics  . Smoking status: Former Smoker -- 1.50 packs/day for 30 years  . Smokeless tobacco: None     Comment: quit 2013  . Alcohol Use: No   OB History    Gravida Para Term Preterm AB TAB SAB Ectopic Multiple Living   0 0 0 0 0 0 0 0       Review of Systems  Musculoskeletal: Positive for joint swelling and arthralgias.  All other systems reviewed and are negative.  Allergies  Ampicillin; Oxycodone-acetaminophen; Toradol; Ambien; Ampicillin-sulbactam sodium; Tape; Cephalexin; Percocet; Sulfamethoxazole-trimethoprim; and Tramadol  Home Medications   Prior to Admission medications   Medication Sig Start Date End Date Taking? Authorizing Provider  albuterol (ACCUNEB) 0.63 MG/3ML nebulizer solution Take 1 ampule by nebulization every 6 (six) hours as needed for wheezing.    Historical Provider, MD  albuterol (PROVENTIL HFA;VENTOLIN HFA) 108 (90 BASE) MCG/ACT inhaler Inhale 1 puff into the lungs every 6 (six) hours as needed for wheezing or  shortness of breath.    Historical Provider, MD  albuterol (PROVENTIL) (2.5 MG/3ML) 0.083% nebulizer solution Take 2.5 mg by nebulization every 6 (six) hours as needed for wheezing or shortness of breath.    Historical Provider, MD  ALPRAZolam Duanne Moron) 1 MG tablet Take 1 mg by mouth at bedtime.    Historical Provider, MD  amLODipine (NORVASC) 5 MG tablet Take 2.5 mg by mouth 2 (two) times daily.  04/06/13   Historical Provider, MD  dicyclomine (BENTYL) 20 MG tablet Take 20 mg by mouth daily as needed for spasms.     Historical Provider, MD  DOCUSATE SODIUM PO Take 1 capsule by mouth daily.    Historical Provider, MD  DULoxetine (CYMBALTA) 30 MG capsule Take 30 mg by mouth daily.    Historical Provider, MD  esomeprazole (NEXIUM) 40 MG capsule Take 40 mg by mouth  daily.     Historical Provider, MD  gabapentin (NEURONTIN) 300 MG capsule Take 300 mg by mouth 3 (three) times daily. 09/20/14   Historical Provider, MD  HYDROcodone-acetaminophen (NORCO) 7.5-325 MG per tablet Take 1-2 tablets by mouth every 6 (six) hours as needed for moderate pain.    Historical Provider, MD  IRON PO Take 1 tablet by mouth daily.    Historical Provider, MD  KRILL OIL PO Take 1 tablet by mouth daily.    Historical Provider, MD  lovastatin (MEVACOR) 10 MG tablet Take 10 mg by mouth daily. 09/22/14   Historical Provider, MD  meclizine (ANTIVERT) 12.5 MG tablet Take 12.5 mg by mouth 3 (three) times daily as needed for nausea.    Historical Provider, MD  polyethylene glycol powder (GLYCOLAX/MIRALAX) powder Take 17 g by mouth daily. For constipation    Historical Provider, MD  senna (SENOKOT) 8.6 MG tablet Take 1-2 tablets by mouth daily.     Historical Provider, MD  tiZANidine (ZANAFLEX) 4 MG capsule Take 2 mg by mouth at bedtime.     Historical Provider, MD  traZODone (DESYREL) 50 MG tablet Take 0.5 tablets (25 mg total) by mouth at bedtime. 12/30/14   Penni Bombard, MD  vitamin B-12 (CYANOCOBALAMIN) 1000 MCG tablet Take 1,000 mcg by mouth daily.    Historical Provider, MD   BP 155/86 mmHg  Pulse 106  Temp(Src) 97.9 F (36.6 C) (Oral)  Resp 18  SpO2 96% Physical Exam  Constitutional: She is oriented to person, place, and time. She appears well-developed and well-nourished. No distress.  HENT:  Head: Normocephalic and atraumatic.  Eyes: Conjunctivae and EOM are normal.  Neck: Normal range of motion. Neck supple. No tracheal deviation present.  Cardiovascular: Normal rate.   Pulmonary/Chest: Effort normal. No respiratory distress.  Abdominal: Soft. She exhibits no distension. There is no tenderness. There is no rebound.  Musculoskeletal: Normal range of motion.  Dorsal L wrist tenderness to palpation with associated swelling that extends halfway up her forearm. Limited ROM  due to pain.   Neurological: She is alert and oriented to person, place, and time.  Skin: Skin is warm and dry.  Psychiatric: She has a normal mood and affect. Her behavior is normal.  Nursing note and vitals reviewed.  ED Course  Procedures (including critical care time) DIAGNOSTIC STUDIES: Oxygen Saturation is 96% on RA, normal by my interpretation.    COORDINATION OF CARE: 1:37 PM-Discussed treatment plan which includes follow-up with PCP and hand specialist with pt at bedside and pt agreed to plan.   Labs Review Labs Reviewed - No data to display  Imaging Review No results found. I have personally reviewed and evaluated these images and lab results as part of my medical decision-making.   EKG Interpretation None      MDM   Final diagnoses:  Left wrist pain    Xray unremarkable for acute changes. Patient will be discharged without further evaluation.   I personally performed the services described in this documentation, which was scribed in my presence. The recorded information has been reviewed and is accurate.    Alvina Chou, PA-C 03/22/15 [place], MD 03/22/15 [phone]

## 2015-03-20 NOTE — Discharge Instructions (Signed)
Continue to rest, ice and elevate your wrist. Follow up with the recommended Orthopedic doctor.

## 2015-03-24 ENCOUNTER — Encounter: Payer: Medicare Other | Attending: Physical Medicine & Rehabilitation

## 2015-03-24 ENCOUNTER — Ambulatory Visit (HOSPITAL_BASED_OUTPATIENT_CLINIC_OR_DEPARTMENT_OTHER): Payer: Medicare Other | Admitting: Physical Medicine & Rehabilitation

## 2015-03-24 ENCOUNTER — Encounter: Payer: Self-pay | Admitting: Physical Medicine & Rehabilitation

## 2015-03-24 VITALS — BP 162/100 | HR 75 | Resp 16

## 2015-03-24 DIAGNOSIS — S63502A Unspecified sprain of left wrist, initial encounter: Secondary | ICD-10-CM | POA: Diagnosis not present

## 2015-03-24 DIAGNOSIS — F329 Major depressive disorder, single episode, unspecified: Secondary | ICD-10-CM | POA: Diagnosis not present

## 2015-03-24 DIAGNOSIS — H3530 Unspecified macular degeneration: Secondary | ICD-10-CM | POA: Insufficient documentation

## 2015-03-24 DIAGNOSIS — M1990 Unspecified osteoarthritis, unspecified site: Secondary | ICD-10-CM | POA: Diagnosis not present

## 2015-03-24 DIAGNOSIS — S12600A Unspecified displaced fracture of seventh cervical vertebra, initial encounter for closed fracture: Secondary | ICD-10-CM | POA: Insufficient documentation

## 2015-03-24 DIAGNOSIS — W1839XA Other fall on same level, initial encounter: Secondary | ICD-10-CM | POA: Insufficient documentation

## 2015-03-24 DIAGNOSIS — M25512 Pain in left shoulder: Secondary | ICD-10-CM

## 2015-03-24 DIAGNOSIS — M810 Age-related osteoporosis without current pathological fracture: Secondary | ICD-10-CM | POA: Diagnosis not present

## 2015-03-24 DIAGNOSIS — M797 Fibromyalgia: Secondary | ICD-10-CM | POA: Diagnosis not present

## 2015-03-24 DIAGNOSIS — I10 Essential (primary) hypertension: Secondary | ICD-10-CM | POA: Insufficient documentation

## 2015-03-24 DIAGNOSIS — Z79891 Long term (current) use of opiate analgesic: Secondary | ICD-10-CM | POA: Diagnosis not present

## 2015-03-24 DIAGNOSIS — S22022A Unstable burst fracture of second thoracic vertebra, initial encounter for closed fracture: Secondary | ICD-10-CM | POA: Insufficient documentation

## 2015-03-24 DIAGNOSIS — K219 Gastro-esophageal reflux disease without esophagitis: Secondary | ICD-10-CM | POA: Diagnosis not present

## 2015-03-24 DIAGNOSIS — Y9389 Activity, other specified: Secondary | ICD-10-CM | POA: Insufficient documentation

## 2015-03-24 DIAGNOSIS — J449 Chronic obstructive pulmonary disease, unspecified: Secondary | ICD-10-CM | POA: Diagnosis not present

## 2015-03-24 DIAGNOSIS — Y929 Unspecified place or not applicable: Secondary | ICD-10-CM | POA: Insufficient documentation

## 2015-03-24 DIAGNOSIS — M5136 Other intervertebral disc degeneration, lumbar region: Secondary | ICD-10-CM | POA: Diagnosis not present

## 2015-03-24 DIAGNOSIS — M7918 Myalgia, other site: Secondary | ICD-10-CM

## 2015-03-24 DIAGNOSIS — I639 Cerebral infarction, unspecified: Secondary | ICD-10-CM | POA: Diagnosis not present

## 2015-03-24 DIAGNOSIS — D649 Anemia, unspecified: Secondary | ICD-10-CM | POA: Insufficient documentation

## 2015-03-24 DIAGNOSIS — E785 Hyperlipidemia, unspecified: Secondary | ICD-10-CM | POA: Insufficient documentation

## 2015-03-24 DIAGNOSIS — F17210 Nicotine dependence, cigarettes, uncomplicated: Secondary | ICD-10-CM | POA: Insufficient documentation

## 2015-03-24 HISTORY — DX: Myalgia, other site: M79.18

## 2015-03-24 MED ORDER — TIZANIDINE HCL 2 MG PO CAPS: 2.0000 mg | ORAL_CAPSULE | Freq: Three times a day (TID) | ORAL | Status: DC | PRN

## 2015-03-24 MED ORDER — GABAPENTIN 400 MG PO CAPS: 400.0000 mg | ORAL_CAPSULE | Freq: Three times a day (TID) | ORAL | Status: DC

## 2015-03-24 NOTE — Patient Instructions (Signed)
I have increased the gabapentin as well as the Zanaflex. Taking her hydrocodone only on as-needed basis  X-rays will be done at [facility] across the street.

## 2015-03-24 NOTE — Progress Notes (Signed)
Subjective:    Patient ID: [name], female    DOB: 05/03/1941, 74 y.o.   MRN: [id number] 74 y.o. female, with past medical history of COPD, hypertension, hyperlipidemia, history of previous tobacco abuse, currently smoking a cigarettes, patient presents to ED secondary to fall, reports she was bending over in her closet, when her dog jumped on her and she fell forward, striking her head into the wall,, she denies any loss of consciousness, lightheadedness, syncope ,, she had CT head, cervical spine, thoracic spine, significant for C7, T2, T3 compression fracture, ED discussed with Dr. Saintclair Halsted from neurosurgery, recommend cervical thoracic brace with outpatient follow-up in one week.  HPI Chief complaint is left scapular pain 74 year old female with history of COPD who has a one-year history of left-sided scapular pain. She has had multiple falls initially related to dizziness. The dizziness has improved. During one of her falls she suffered a C7 and T2 compression fracture. She's had MRI of the cervical and thoracic spine. In addition to the compression fractures there is evidence of cervical spondylosis. Patient has been seen by neurology in the past. 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  More recently she's had a fall injuring her left wrist. She was evaluated in the emergency department. She was x-rayed and this was negative. She states the last time she went to the urgency room she was told there was a fracture. She has an appointment with a hand surgeon next week. She is currently wearing a wrist splint.  She is taking hydrocodone 7.5 mg 4 times per day. She feels this is not helping her. In the past she has taken oxycodone this has caused itching but did okay when she took Benadryl with it.  In addition she is taking gabapentin 300 mg 3 times per day. Pain medications are prescribed by  primary care physician.  Pain Inventory Average Pain 9 Pain Right Now 9 My pain is constant, stabbing and aching  In the last 24 hours, has pain interfered with the following? General activity 6 Relation with others 5 Enjoyment of life 9 What TIME of day is your pain at its worst? Morning, Daytime, Evening and Night Sleep (in general) Poor  Pain is worse with: bending, standing and some activites Pain improves with: medication Relief from Meds: 3  Mobility walk without assistance how many minutes can you walk? 10 ability to climb steps?  no do you drive?  yes transfers alone Do you have any goals in this area?  yes  Function disabled: date disabled NA retired I need assistance with the following:  household duties Do you have any goals in this area?  yes  Neuro/Psych depression anxiety  Prior Studies Any changes since last visit?  no Patient Complaints: Patient is a 74 year old female with tingling in her left hand all fingers here for evaluation of these symptoms.  NCV & EMG Findings: Extensive electrodiagnostic testing of the left upper extremity reveals:   1. The left median sensory nerve showed prolonged distal peak latency (4.7 ms) and reduced amplitude (8.4 V).  The radial and ulnar sensory responses are within normal limits. 2. Left median motor nerve showed prolonged distal onset latency (4.7 ms), with preserved amplitude.  hhe left ulnar motor nerve showed reduced amplitude (6.4 mV).    3. Needle electrode examination is somewhat hampered by variable and incomplete motor unit activation  due to pain. Chronic motor axon loss changes are seen affecting C8-T1 myotomes, without accompanied active denervation.  Impression: 1. Left median neuropathy at or distal to the wrist, consistent medical diagnosis of carpal tunnel syndrome. Overall, these findings are severe in degree electrically. 2. Chronic C8-radiculopathy affecting the left upper extremity, mild in  degree electrically. Physicians involved in your care Any changes since last visit?  no   Family History  Problem Relation Age of Onset  . Heart failure Mother   . Heart attack Father    Social History   Social History  . Marital Status: Single    Spouse Name: N/A  . Number of Children: 3  . Years of Education: middle sch   Occupational History  . retired     n/a   Social History Main Topics  . Smoking status: Former Smoker -- 1.50 packs/day for 30 years  . Smokeless tobacco: None     Comment: quit 2013  . Alcohol Use: No  . Drug Use: No  . Sexual Activity: No   Other Topics Concern  . None   Social History Narrative   ** Merged History Encounter **       Patient lives at home with daughter. Caffeine Use: 4 cups daily   Past Surgical History  Procedure Laterality Date  . Abdominal surgery    . Colon surgery    . Joint replacement    . Appendectomy    . Bladder surgery    . Rectocele repair    . Knee surgery    . Cholecystectomy    . Abdominal hysterectomy    . Ostomy    . Eye surgery      cataracts   Past Medical History  Diagnosis Date  . Hiatal hernia   . Anemia   . GERD (gastroesophageal reflux disease)   . Hyperlipidemia   . Depression     major  . Stroke [facility])     "mini stroke"  . Osteoarthritis   . Reactive airway disease   . B12 deficiency   . Osteoporosis   . Rupture of bowel ([place])   . Bladder incontinence   . DDD (degenerative disc disease), lumbar   . Cataract   . COPD (chronic obstructive pulmonary disease) ([place])   . Hypertension   . Stroke ([place])   . Macular degeneration   . Blind left eye   . Cervical spine fracture ([place])   . Compression fracture     C7,  upper T spine -compression fx   BP 162/100 mmHg  Pulse 75  Resp 16  SpO2 96%  Opioid Risk [name]:   Fall Risk [name]:  `1  Depression screen PHQ 2/9  Depression screen PHQ 2/9 03/24/2015  Decreased Interest 2  Down, Depressed, Hopeless 3  PHQ - 2 [name] 5    Altered sleeping 3  Tired, decreased energy 3  Change in appetite 2  Feeling bad or failure about yourself  2  Trouble concentrating 1  Moving slowly or fidgety/restless 0  Suicidal thoughts 0  PHQ-9 [name] 16  Difficult doing work/chores Somewhat difficult      Review of Systems  Constitutional: Positive for unexpected weight change.  Respiratory:       COPD  Psychiatric/Behavioral: The patient is nervous/anxious.        Depression       Objective:   Physical Exam  Constitutional: She is oriented to person, place, and time. She appears well-developed and well-nourished.  HENT:  Head: Normocephalic and atraumatic.  Eyes: Conjunctivae and EOM are normal. Pupils are equal, round, and reactive to light.  Neck: Normal range of motion. Neck supple.  Cardiovascular: Normal rate, regular rhythm and normal heart sounds.   Pulmonary/Chest: Effort normal and breath sounds normal. No respiratory distress.  Abdominal: Soft. Bowel sounds are normal.  Musculoskeletal:       Right shoulder: Normal.       Left shoulder: She exhibits tenderness. She exhibits no deformity.       Left wrist: She exhibits decreased range of motion, tenderness and effusion.       Cervical back: She exhibits decreased range of motion.       Thoracic back: She exhibits deformity.       Left hand: She exhibits normal range of motion. Normal sensation noted. Normal strength noted.  Pain with cervical extension  Pain in left greater than right upper trapezius Pain over the Infraspinatus fossa on the left side. Pain along the cervical and thoracic paraspinal muscle groups. No pain along the lumbar paraspinal area.  Left wrist has dorsal swelling as well as swelling along the flexor tendons   Neurological: She is alert and oriented to person, place, and time. No sensory deficit.  Pinprick sensation normal in bilateral C5 C6 C7 C8 distribution  Motor strength is 4/5 bilateral deltoid bicep tricep left grip is 3  minus limited by pain, finger extension 3 minus limited by pain Right sided finger flexion-extension 4+ Lower extremity 5/5 in the hip flexor and knee extensor and ankle dorsiflexor plantar flexor.    Skin: Skin is warm and dry.  Psychiatric: Her behavior is normal. Thought content normal. Her affect is labile. Her speech is not delayed. Cognition and memory are normal.  Occasionally whines I hurt Daughter becomes upset when patient states this  Nursing note and vitals reviewed.         Assessment & Plan:  1. Chronic left parascapular pain. This appears to be mainly myofascial. She does have a history of a T2 as well as a C7 compression fracture. She does have kyphotic deformity which may be contributing to her pain complaints. She does state that her pain in her upper back and left shoulder blade started about a year ago. This was prior to her falls.  We discussed that this appears to be mainly a myofascial pain which is not treated with narcotic analgesics. For now she will continue her hydrocodone and told she undergoes orthopedic evaluation as she still has left wrist pain from  A fall. Will increase her gabapentin to 400 mg 3 times a day Increase Zanaflex to 2 mg 3 times a day Cautioned about excessive sedation especially given her age  41. Cervical spondylosis some of her scapular pain may be radiating from the lower cervical facets on the left side. She may benefit from medial branch blocks however she is not very enthusiastic about any type of injection.  3. Left shoulder pain may be related to her posture with tendency towards impingement. We'll check x-ray given history of falls.  4. Left wrist pain x-rays reported as negative, actual films reviewed did not see any evidence of fracture. She will be following up with orthopedics next week  This is a complex situation with multiple potential pain generators. Given her age we need to be due to just this with her medications. I do  anticipate she will need some physical therapy. Transportation is an issue since both the daughter and the  patient do not drive

## 2015-04-11 DIAGNOSIS — M542 Cervicalgia: Secondary | ICD-10-CM | POA: Diagnosis not present

## 2015-04-23 ENCOUNTER — Encounter: Payer: Self-pay | Admitting: Physical Medicine & Rehabilitation

## 2015-04-24 ENCOUNTER — Ambulatory Visit (HOSPITAL_COMMUNITY)
Admission: RE | Admit: 2015-04-24 | Discharge: 2015-04-24 | Disposition: A | Discharge status: Home / Self Care | Payer: Medicare Other | Source: Ambulatory Visit | Attending: Physical Medicine & Rehabilitation | Admitting: Physical Medicine & Rehabilitation

## 2015-04-24 DIAGNOSIS — M25512 Pain in left shoulder: Secondary | ICD-10-CM

## 2015-04-24 DIAGNOSIS — G8929 Other chronic pain: Secondary | ICD-10-CM | POA: Diagnosis not present

## 2015-04-24 NOTE — Telephone Encounter (Signed)
The shoulder blade should be included in her complete shoulder x-ray. She can have it done tomorrow but, we may not have the radiologist report back by the time of your office visit. It would be opportune to have this done today if possible.  Thanks

## 2015-04-25 ENCOUNTER — Other Ambulatory Visit: Payer: Self-pay | Admitting: Physical Medicine & Rehabilitation

## 2015-04-25 ENCOUNTER — Encounter: Payer: Medicare Other | Attending: Physical Medicine & Rehabilitation

## 2015-04-25 ENCOUNTER — Encounter: Payer: Self-pay | Admitting: Physical Medicine & Rehabilitation

## 2015-04-25 ENCOUNTER — Ambulatory Visit (HOSPITAL_BASED_OUTPATIENT_CLINIC_OR_DEPARTMENT_OTHER): Payer: Medicare Other | Admitting: Physical Medicine & Rehabilitation

## 2015-04-25 VITALS — BP 126/80 | HR 63 | Resp 14

## 2015-04-25 DIAGNOSIS — E785 Hyperlipidemia, unspecified: Secondary | ICD-10-CM | POA: Insufficient documentation

## 2015-04-25 DIAGNOSIS — I639 Cerebral infarction, unspecified: Secondary | ICD-10-CM | POA: Insufficient documentation

## 2015-04-25 DIAGNOSIS — K219 Gastro-esophageal reflux disease without esophagitis: Secondary | ICD-10-CM | POA: Diagnosis not present

## 2015-04-25 DIAGNOSIS — S22022A Unstable burst fracture of second thoracic vertebra, initial encounter for closed fracture: Secondary | ICD-10-CM | POA: Insufficient documentation

## 2015-04-25 DIAGNOSIS — M4802 Spinal stenosis, cervical region: Secondary | ICD-10-CM

## 2015-04-25 DIAGNOSIS — F329 Major depressive disorder, single episode, unspecified: Secondary | ICD-10-CM | POA: Insufficient documentation

## 2015-04-25 DIAGNOSIS — F17210 Nicotine dependence, cigarettes, uncomplicated: Secondary | ICD-10-CM | POA: Diagnosis not present

## 2015-04-25 DIAGNOSIS — I10 Essential (primary) hypertension: Secondary | ICD-10-CM | POA: Insufficient documentation

## 2015-04-25 DIAGNOSIS — I69319 Unspecified symptoms and signs involving cognitive functions following cerebral infarction: Secondary | ICD-10-CM

## 2015-04-25 DIAGNOSIS — H3530 Unspecified macular degeneration: Secondary | ICD-10-CM | POA: Insufficient documentation

## 2015-04-25 DIAGNOSIS — S63502A Unspecified sprain of left wrist, initial encounter: Secondary | ICD-10-CM | POA: Insufficient documentation

## 2015-04-25 DIAGNOSIS — M810 Age-related osteoporosis without current pathological fracture: Secondary | ICD-10-CM | POA: Diagnosis not present

## 2015-04-25 DIAGNOSIS — J449 Chronic obstructive pulmonary disease, unspecified: Secondary | ICD-10-CM | POA: Insufficient documentation

## 2015-04-25 DIAGNOSIS — G894 Chronic pain syndrome: Secondary | ICD-10-CM

## 2015-04-25 DIAGNOSIS — M797 Fibromyalgia: Secondary | ICD-10-CM | POA: Insufficient documentation

## 2015-04-25 DIAGNOSIS — M1990 Unspecified osteoarthritis, unspecified site: Secondary | ICD-10-CM | POA: Diagnosis not present

## 2015-04-25 DIAGNOSIS — M5136 Other intervertebral disc degeneration, lumbar region: Secondary | ICD-10-CM | POA: Insufficient documentation

## 2015-04-25 DIAGNOSIS — D649 Anemia, unspecified: Secondary | ICD-10-CM | POA: Diagnosis not present

## 2015-04-25 DIAGNOSIS — Z79891 Long term (current) use of opiate analgesic: Secondary | ICD-10-CM | POA: Insufficient documentation

## 2015-04-25 DIAGNOSIS — Z5181 Encounter for therapeutic drug level monitoring: Secondary | ICD-10-CM

## 2015-04-25 DIAGNOSIS — S12600A Unspecified displaced fracture of seventh cervical vertebra, initial encounter for closed fracture: Secondary | ICD-10-CM | POA: Insufficient documentation

## 2015-04-25 DIAGNOSIS — M25512 Pain in left shoulder: Secondary | ICD-10-CM | POA: Insufficient documentation

## 2015-04-25 MED ORDER — ACETAMINOPHEN-CODEINE #4 300-60 MG PO TABS: 1.0000 | ORAL_TABLET | Freq: Three times a day (TID) | ORAL | Status: DC | PRN

## 2015-04-25 NOTE — Progress Notes (Signed)
Subjective:    Patient ID: [name], female    DOB: Jul 11, 1940, 74 y.o.   MRN: [id number]  HPI 74 year old female with history of C7 as well as T2 compression fractures without neurologic deficits. She has a chief complaint of left-sided neck pain today. She gets some relief by using heat to that area. She feels like it's getting more limber. She has no significant weakness in her arms.. She is also using a cream to that area.  I reviewed MRI scans as listed below. Patient is here with her daughter who is also a patient at this clinic  Pain Inventory Average Pain 8 Pain Right Now 9 My pain is constant, burning, dull, stabbing and aching  In the last 24 hours, has pain interfered with the following? General activity 9 Relation with others 9 Enjoyment of life 9 What TIME of day is your pain at its worst? all Sleep (in general) Fair  Pain is worse with: walking, bending, sitting, inactivity and standing Pain improves with: heat/ice and medication Relief from Meds: 4  Mobility walk without assistance walk with assistance ability to climb steps?  yes do you drive?  yes transfers alone  Function not employed: date last employed . disabled: date disabled . I need assistance with the following:  household duties and shopping  Neuro/Psych weakness tremor dizziness depression anxiety  Prior Studies Any changes since last visit?  no CLINICAL DATA: Known cervical spine fracture, new onset fever. Here for assessment of sepsis.  EXAM: MRI CERVICAL AND LUMBAR SPINE WITHOUT CONTRAST  TECHNIQUE: Multiplanar and multiecho pulse sequences of the cervical spine, to include the craniocervical junction and cervicothoracic junction, and thoracic spine, were obtained without intravenous contrast. Please note, motion degraded examination. Per technologist note, patient did not wish to receive intravenous contrast due to pain, examination is  noncontrast.  COMPARISON: CT of the cervical spine Oct 05, 2014  FINDINGS: MRI CERVICAL SPINE FINDINGS  Motion degraded examination.  Moderate C7 compression fracture, low T1 and bright T2 signal with 30-50% height loss. No retropulsed bony fragments. Remaining cervical vertebral bodies appear intact. Maintenance of cervical lordosis. Intervertebral discs demonstrate normal morphology, decreased T2 signal within all cervical disc most consistent with desiccation.  Low signal measuring up to 6 mm in AP dimension posterior to the odontoid process consistent with pannus, can be seen with CPPD without erosions or edema. Cervical spinal cord appears normal morphology and signal characteristics though, motion degrades sensitivity for subtle cord signal abnormality. No syrinx. Included prevertebral and paraspinal soft tissues are normal.  Level by level evaluation:  C2-3: Moderate facet arthropathy. No disc bulge, canal stenosis or neural foraminal narrowing.  C3-4: Annular bulging, uncovertebral hypertrophy. Moderate LEFT greater than RIGHT facet arthropathy. Very mild canal stenosis. Mild LEFT neural foraminal narrowing.  C4-5: Small broad-based disc bulge, mild to moderate facet arthropathy and uncovertebral hypertrophy. Moderate canal stenosis. Moderate to severe LEFT greater than RIGHT neural foraminal narrowing.  C5-6: Small broad-based disc bulge, uncovertebral hypertrophy and moderate facet arthropathy. Mild canal stenosis. Moderate RIGHT, moderate to severe LEFT neural foraminal narrowing.  C6-7: Severely limited by motion and habitus. No definite disc bulge. At least moderate facet arthropathy without canal stenosis or neural foraminal narrowing.  C7-T1: No definite disc bulge, canal stenosis. At least mild facet arthropathy without neural foraminal narrowing.  MRI THORACIC SPINE FINDINGS  Moderate acute appearing T2 compression fracture with 30-50%  height loss. Mild acute appearing T3 compression fracture. The remaining thoracic vertebral bodies appear  intact. Maintenance of thoracic kyphosis. Intervertebral discs demonstrate relatively preserved morphology and signal characteristics. Minimal subacute to chronic discogenic endplate change of lower thoracic spine. 13 mm T6 hemangioma. Scattered chronic Schmorl's nodes.  Thoracic spinal cord appears normal in morphology and signal characteristics of the level the conus medullaris which terminates at T12-L1. Motion decreases sensitivity for potential cord signal abnormality.  No significant disc bulge, canal stenosis or neural foraminal narrowing any thoracic level.  IMPRESSION: Please note, examination was ordered with contrast though, after noncontrast portion, patient was unable to tolerate imaging due to pain and contrast-enhanced sequences not performed.  MRI CERVICAL SPINE: Acute moderate C7 compression fracture. No malalignment.  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 Multilevel neural foraminal narrowing: Moderate to severe on the LEFT at C4-5 and C5-6.  MRI THORACIC SPINE: Acute moderate T2 compression fracture. Mild acute T3 compression fracture. No malalignment.   Electronically Signed  By: Elon Alas  On: 10/08/2014 04:30Physicians involved in your care Any changes since last visit?  no   Family History  Problem Relation Age of Onset  . Heart failure Mother   . Heart attack Father    Social History   Social History  . Marital Status: Single    Spouse Name: N/A  . Number of Children: 3  . Years of Education: middle sch   Occupational History  . retired     n/a   Social History Main Topics  . Smoking status: Former Smoker -- 1.50 packs/day for 30 years  . Smokeless tobacco: None     Comment: quit 2013  . Alcohol Use: No  . Drug Use: No  . Sexual Activity: No   Other Topics  Concern  . None   Social History Narrative   ** Merged History Encounter **       Patient lives at home with daughter. Caffeine Use: 4 cups daily   Past Surgical History  Procedure Laterality Date  . Abdominal surgery    . Colon surgery    . Joint replacement    . Appendectomy    . Bladder surgery    . Rectocele repair    . Knee surgery    . Cholecystectomy    . Abdominal hysterectomy    . Ostomy    . Eye surgery      cataracts   Past Medical History  Diagnosis Date  . Hiatal hernia   . Anemia   . GERD (gastroesophageal reflux disease)   . Hyperlipidemia   . Depression     major  . Stroke [facility])     "mini stroke"  . Osteoarthritis   . Reactive airway disease   . B12 deficiency   . Osteoporosis   . Rupture of bowel ([place])   . Bladder incontinence   . DDD (degenerative disc disease), lumbar   . Cataract   . COPD (chronic obstructive pulmonary disease) ([place])   . Hypertension   . Stroke ([place])   . Macular degeneration   . Blind left eye   . Cervical spine fracture ([place])   . Compression fracture     C7,  upper T spine -compression fx   BP 126/80 mmHg  Pulse 63  Resp 14  SpO2 97%  Opioid Risk [name]:   Fall Risk [name]:  `1  Depression screen PHQ 2/9  Depression screen PHQ 2/9 03/24/2015  Decreased Interest 2  Down, Depressed, Hopeless 3  PHQ - 2 [name] 5  Altered sleeping  3  Tired, decreased energy 3  Change in appetite 2  Feeling bad or failure about yourself  2  Trouble concentrating 1  Moving slowly or fidgety/restless 0  Suicidal thoughts 0  PHQ-9 [name] 16  Difficult doing work/chores Somewhat difficult     Review of Systems  Constitutional: Positive for appetite change and unexpected weight change.  Gastrointestinal: Positive for nausea, vomiting and constipation.  Neurological: Positive for dizziness, tremors and weakness.  Psychiatric/Behavioral: Positive for dysphoric mood. The patient is nervous/anxious.   All other systems reviewed and  are negative.      Objective:   Physical Exam  Constitutional: She is oriented to person, place, and time. She appears well-developed and well-nourished.  HENT:  Head: Normocephalic and atraumatic.  Eyes: Conjunctivae are normal. Pupils are equal, round, and reactive to light.  Musculoskeletal:       Cervical back: She exhibits decreased range of motion and tenderness. She exhibits no deformity.  Tenderness over left infraspinatus  Neurological: She is alert and oriented to person, place, and time.  Psychiatric: She has a normal mood and affect.  Nursing note and vitals reviewed. Gait is without evidence of myelopathy. Motor strength is 5/5 bilateral deltoids, biceps, triceps, grip, hip flexor, knee extensor, ankle dorsi flexor and plantar flexor       Assessment & Plan:  1 cervical stenosis with left-sided pain,, axial, likely related to facet arthropathy. She may have some intermittent radicular symptoms as well. The patient has documented pathology that would warrant the use of narcotic analgesics, she does have cognitive deficits that may worsen with narcotic analgesics. Patient has been released by neurosurgery, no plans for cervical spine decompression. Patient does not appear to have any evidence of myelopathy She has had trials of narcotic analgesics. The hydrocodone 7.5 mg has done reasonably well for her she has not tried Tylenol with Codeine. She does get itching with oxycodone But this subsides with Benadryl.  I discussed with both patient and her daughter trying some Tylenol No. 4 checking a urine drug screen today. She will take 1-2 tablets up to 3 times per day. If this is helpful she can remain on this medication if not she may have to go back to the hydrocodone 7.5 mg. Over half of the 25 min visit was spent counseling and coordinating care.

## 2015-04-25 NOTE — Patient Instructions (Signed)
You have cervical spinal stenosis which is likely causing the neck pain as well as some of the shoulder pain. You need to avoid leaning to her head toward the left side.  Physical therapy with traction may be helpful.  Medications might help some as well but we need to watch out in terms of dose because of history of confusion related to strokes

## 2015-04-26 ENCOUNTER — Encounter: Payer: Self-pay | Admitting: Physical Medicine & Rehabilitation

## 2015-04-26 LAB — PMP ALCOHOL METABOLITE (ETG): Ethyl Glucuronide (EtG): NEGATIVE ng/mL

## 2015-04-27 ENCOUNTER — Encounter: Payer: Self-pay | Admitting: Physical Medicine & Rehabilitation

## 2015-04-29 LAB — BENZODIAZEPINES (GC/LC/MS), URINE
Alprazolam metabolite (GC/LC/MS), ur confirm: 421 ng/mL (ref <25)
Clonazepam metabolite (GC/LC/MS), ur confirm: NEGATIVE ng/mL (ref <25)
Flurazepam metabolite (GC/LC/MS), ur confirm: NEGATIVE ng/mL (ref <50)
Lorazepam (GC/LC/MS), ur confirm: NEGATIVE ng/mL (ref <50)
Midazolam (GC/LC/MS), ur confirm: NEGATIVE ng/mL (ref <50)
Nordiazepam (GC/LC/MS), ur confirm: NEGATIVE ng/mL (ref <50)
Oxazepam (GC/LC/MS), ur confirm: 71 ng/mL — AB (ref <50)
Temazepam (GC/LC/MS), ur confirm: NEGATIVE ng/mL (ref <50)
Triazolam metabolite (GC/LC/MS), ur confirm: NEGATIVE ng/mL (ref <50)

## 2015-04-29 LAB — PRESCRIPTION MONITORING PROFILE (SOLSTAS)
Amphetamine/Meth: NEGATIVE ng/mL
Barbiturate Screen, Urine: NEGATIVE ng/mL
Buprenorphine, Urine: NEGATIVE ng/mL
Cannabinoid Scrn, Ur: NEGATIVE ng/mL
Carisoprodol, Urine: NEGATIVE ng/mL
Cocaine Metabolites: NEGATIVE ng/mL
Creatinine, Urine: 236.25 mg/dL (ref >20.0)
Fentanyl, Ur: NEGATIVE ng/mL
MDMA URINE: NEGATIVE ng/mL
Meperidine, Ur: NEGATIVE ng/mL
Methadone Screen, Urine: NEGATIVE ng/mL
Nitrites, Initial: NEGATIVE ug/mL
Oxycodone Screen, Ur: NEGATIVE ng/mL
Propoxyphene: NEGATIVE ng/mL
Tapentadol, urine: NEGATIVE ng/mL
Tramadol Scrn, Ur: NEGATIVE ng/mL
Zolpidem, Urine: NEGATIVE ng/mL
pH, Initial: 6.7 pH (ref 4.5–8.9)

## 2015-04-29 LAB — OPIATES/OPIOIDS (LC/MS-MS)
Codeine Urine: NEGATIVE ng/mL (ref <50)
Hydrocodone: 3462 ng/mL (ref <50)
Hydromorphone: NEGATIVE ng/mL — AB (ref <50)
Morphine Urine: NEGATIVE ng/mL (ref <50)
Norhydrocodone, Ur: 5960 ng/mL (ref <50)
Noroxycodone, Ur: NEGATIVE ng/mL (ref <50)
Oxycodone, ur: NEGATIVE ng/mL (ref <50)
Oxymorphone: NEGATIVE ng/mL (ref <50)

## 2015-05-03 IMAGING — CR DG CHEST 2V
3 series · 3 of 3 positions shown · non-contrast
Comparison: Prior radiograph from 11/19/2012

CLINICAL DATA: Sternal chest pain and shortness of breath

EXAM:
CHEST  2 VIEW

[w chest pa (1 of 2)]
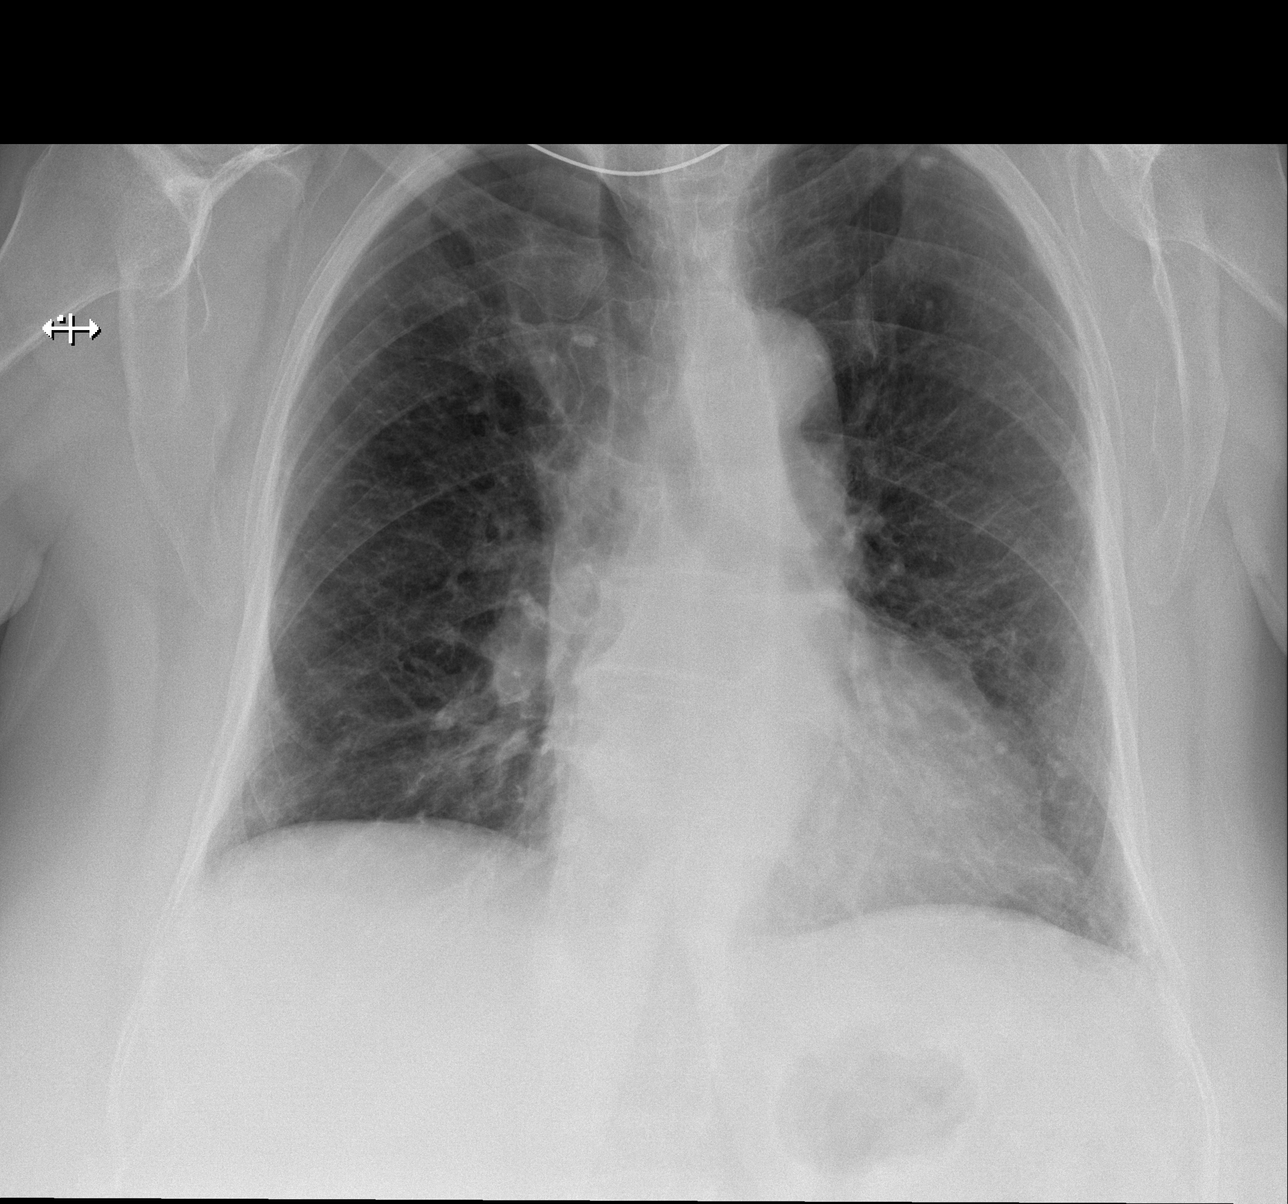

[w chest pa (2 of 2)]
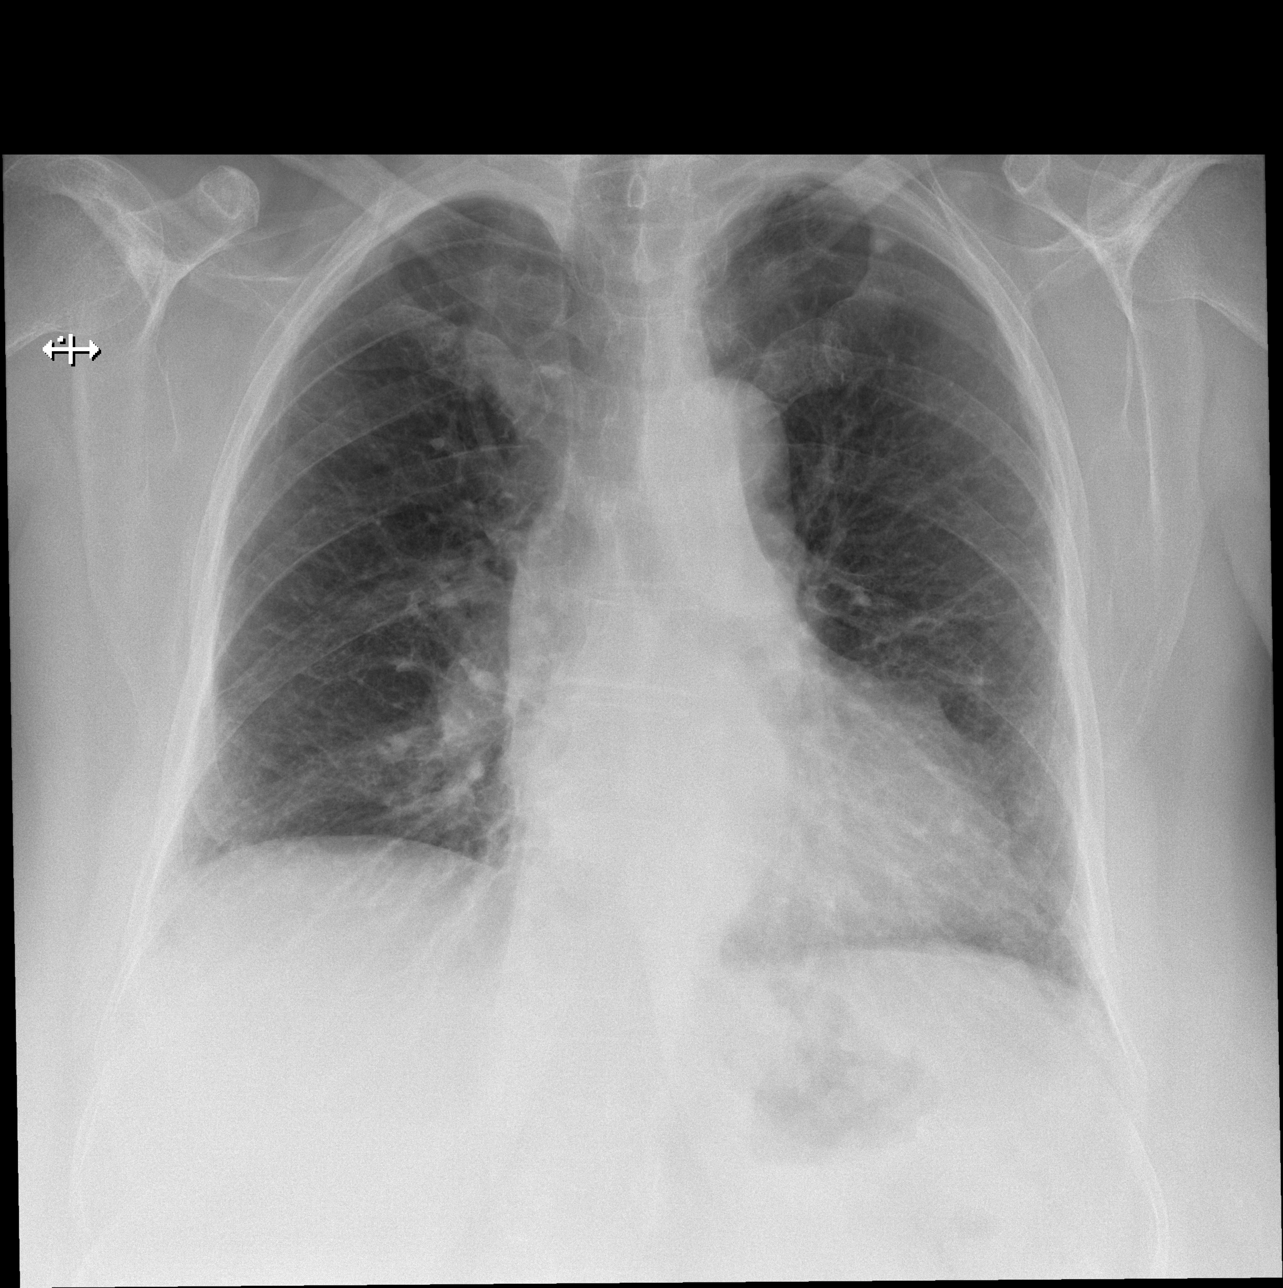

[w chest lat]
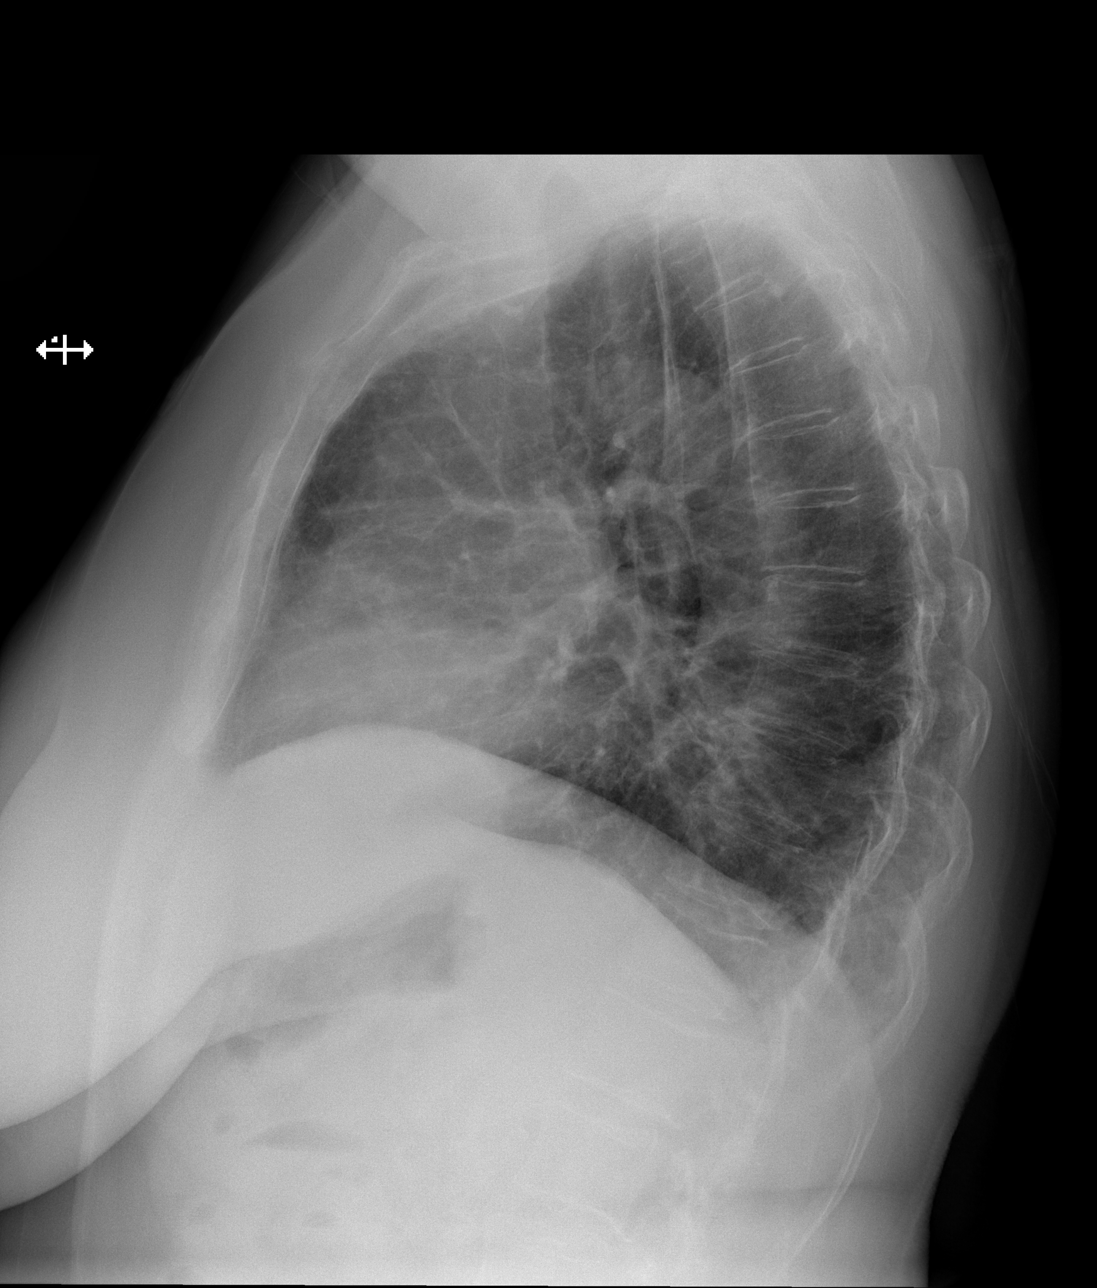

[3 of 3 positions shown; findings below may reference images not displayed]

FINDINGS: Mild cardiomegaly is stable as compared to prior study. Tortuosity
of the intrathoracic aorta is noted.

Lungs are normally inflated. Diffuse chronic bronchitic changes are
similar to prior. No focal infiltrate, pulmonary edema, or pleural
effusion. Scattered calcified granulomas overlying the upper lobes
bilaterally are unchanged. No pneumothorax.

No acute osseous abnormality.
IMPRESSION: Chronic bronchitic changes/ COPD. No acute cardiopulmonary
abnormality identified.

## 2015-05-04 ENCOUNTER — Ambulatory Visit (HOSPITAL_BASED_OUTPATIENT_CLINIC_OR_DEPARTMENT_OTHER): Payer: Medicare Other | Admitting: Physical Medicine & Rehabilitation

## 2015-05-04 ENCOUNTER — Encounter: Payer: Self-pay | Admitting: Physical Medicine & Rehabilitation

## 2015-05-04 ENCOUNTER — Encounter: Payer: Medicare Other | Attending: Physical Medicine & Rehabilitation

## 2015-05-04 VITALS — BP 137/75 | HR 71 | Resp 14

## 2015-05-04 DIAGNOSIS — I10 Essential (primary) hypertension: Secondary | ICD-10-CM | POA: Diagnosis not present

## 2015-05-04 DIAGNOSIS — M791 Myalgia: Secondary | ICD-10-CM | POA: Diagnosis not present

## 2015-05-04 DIAGNOSIS — Z79891 Long term (current) use of opiate analgesic: Secondary | ICD-10-CM | POA: Insufficient documentation

## 2015-05-04 DIAGNOSIS — M25512 Pain in left shoulder: Secondary | ICD-10-CM | POA: Insufficient documentation

## 2015-05-04 DIAGNOSIS — I639 Cerebral infarction, unspecified: Secondary | ICD-10-CM | POA: Insufficient documentation

## 2015-05-04 DIAGNOSIS — S12600A Unspecified displaced fracture of seventh cervical vertebra, initial encounter for closed fracture: Secondary | ICD-10-CM | POA: Diagnosis not present

## 2015-05-04 DIAGNOSIS — Z23 Encounter for immunization: Secondary | ICD-10-CM | POA: Diagnosis not present

## 2015-05-04 DIAGNOSIS — K219 Gastro-esophageal reflux disease without esophagitis: Secondary | ICD-10-CM | POA: Diagnosis not present

## 2015-05-04 DIAGNOSIS — J449 Chronic obstructive pulmonary disease, unspecified: Secondary | ICD-10-CM | POA: Insufficient documentation

## 2015-05-04 DIAGNOSIS — S22022A Unstable burst fracture of second thoracic vertebra, initial encounter for closed fracture: Secondary | ICD-10-CM | POA: Diagnosis not present

## 2015-05-04 DIAGNOSIS — M810 Age-related osteoporosis without current pathological fracture: Secondary | ICD-10-CM | POA: Diagnosis not present

## 2015-05-04 DIAGNOSIS — M47812 Spondylosis without myelopathy or radiculopathy, cervical region: Secondary | ICD-10-CM | POA: Diagnosis not present

## 2015-05-04 DIAGNOSIS — F329 Major depressive disorder, single episode, unspecified: Secondary | ICD-10-CM | POA: Insufficient documentation

## 2015-05-04 DIAGNOSIS — M797 Fibromyalgia: Secondary | ICD-10-CM | POA: Insufficient documentation

## 2015-05-04 DIAGNOSIS — M1990 Unspecified osteoarthritis, unspecified site: Secondary | ICD-10-CM | POA: Insufficient documentation

## 2015-05-04 DIAGNOSIS — D649 Anemia, unspecified: Secondary | ICD-10-CM | POA: Diagnosis not present

## 2015-05-04 DIAGNOSIS — E785 Hyperlipidemia, unspecified: Secondary | ICD-10-CM | POA: Diagnosis not present

## 2015-05-04 DIAGNOSIS — S63502A Unspecified sprain of left wrist, initial encounter: Secondary | ICD-10-CM | POA: Diagnosis not present

## 2015-05-04 DIAGNOSIS — M4802 Spinal stenosis, cervical region: Secondary | ICD-10-CM

## 2015-05-04 DIAGNOSIS — M5136 Other intervertebral disc degeneration, lumbar region: Secondary | ICD-10-CM | POA: Insufficient documentation

## 2015-05-04 DIAGNOSIS — M7918 Myalgia, other site: Secondary | ICD-10-CM

## 2015-05-04 DIAGNOSIS — F17210 Nicotine dependence, cigarettes, uncomplicated: Secondary | ICD-10-CM | POA: Insufficient documentation

## 2015-05-04 DIAGNOSIS — H3530 Unspecified macular degeneration: Secondary | ICD-10-CM | POA: Insufficient documentation

## 2015-05-04 MED ORDER — BUPRENORPHINE 10 MCG/HR TD PTWK: 10.0000 ug | MEDICATED_PATCH | TRANSDERMAL | Status: DC

## 2015-05-04 NOTE — Progress Notes (Signed)
Subjective:    Patient ID: [name], female    DOB: 08-May-1941, 74 y.o.   MRN: [id number]  HPI Patient returns today with complaints of left shoulder blade pain as well as left-sided neck pain. No pain radiating down the arm. No balance disorder. She does not get good relief with the Tylenol No. 4. She previously did not have good relief the hydrocodone, she had itching with the oxycodone Past medical history significant for prior stroke. She's had some chronic dizziness  But this has improved somewhat over time. No recent falls.  Pain Inventory Average Pain 8 Pain Right Now 9 My pain is constant, burning, dull, stabbing and aching  In the last 24 hours, has pain interfered with the following? General activity 9 Relation with others 9 Enjoyment of life 9 What TIME of day is your pain at its worst? all Sleep (in general) Fair  Pain is worse with: walking, bending, sitting, inactivity, standing and some activites Pain improves with: heat/ice and medication Relief from Meds: 1  Mobility walk without assistance walk with assistance ability to climb steps?  yes do you drive?  yes transfers alone  Function not employed: date last employed . disabled: date disabled . I need assistance with the following:  household duties and shopping  Neuro/Psych weakness tremor depression anxiety  Prior Studies Any changes since last visit?  no  Physicians involved in your care Any changes since last visit?  no   Family History  Problem Relation Age of Onset  . Heart failure Mother   . Heart attack Father    Social History   Social History  . Marital Status: Single    Spouse Name: N/A  . Number of Children: 3  . Years of Education: middle sch   Occupational History  . retired     n/a   Social History Main Topics  . Smoking status: Former Smoker -- 1.50 packs/day for 30 years  . Smokeless tobacco: None     Comment: quit 2013  . Alcohol Use: No  . Drug Use:  No  . Sexual Activity: No   Other Topics Concern  . None   Social History Narrative   ** Merged History Encounter **       Patient lives at home with daughter. Caffeine Use: 4 cups daily   Past Surgical History  Procedure Laterality Date  . Abdominal surgery    . Colon surgery    . Joint replacement    . Appendectomy    . Bladder surgery    . Rectocele repair    . Knee surgery    . Cholecystectomy    . Abdominal hysterectomy    . Ostomy    . Eye surgery      cataracts   Past Medical History  Diagnosis Date  . Hiatal hernia   . Anemia   . GERD (gastroesophageal reflux disease)   . Hyperlipidemia   . Depression     major  . Stroke [facility])     "mini stroke"  . Osteoarthritis   . Reactive airway disease   . B12 deficiency   . Osteoporosis   . Rupture of bowel ([place])   . Bladder incontinence   . DDD (degenerative disc disease), lumbar   . Cataract   . COPD (chronic obstructive pulmonary disease) ([place])   . Hypertension   . Stroke ([place])   . Macular degeneration   . Blind left eye   . Cervical spine fracture ([place])   .  Compression fracture     C7,  upper T spine -compression fx   BP 137/75 mmHg  Pulse 71  Resp 14  SpO2 97%  Opioid Risk [name]:   Fall Risk [name]:  `1  Depression screen PHQ 2/9  Depression screen PHQ 2/9 03/24/2015  Decreased Interest 2  Down, Depressed, Hopeless 3  PHQ - 2 [name] 5  Altered sleeping 3  Tired, decreased energy 3  Change in appetite 2  Feeling bad or failure about yourself  2  Trouble concentrating 1  Moving slowly or fidgety/restless 0  Suicidal thoughts 0  PHQ-9 [name] 16  Difficult doing work/chores Somewhat difficult     Review of Systems  Constitutional: Positive for appetite change and unexpected weight change.  Gastrointestinal: Positive for vomiting and constipation.  Neurological: Positive for tremors and weakness.  Psychiatric/Behavioral: Positive for dysphoric mood. The patient is nervous/anxious.   All  other systems reviewed and are negative.      Objective:   Physical Exam  Constitutional: She is oriented to person, place, and time. She appears well-developed and well-nourished.  HENT:  Head: Normocephalic and atraumatic.  Eyes: Conjunctivae and EOM are normal. Pupils are equal, round, and reactive to light.  Neck: Normal range of motion.  Musculoskeletal:       Right shoulder: She exhibits tenderness. She exhibits normal range of motion.  Positive Spurling's toward the left side this causes pain toward the left shoulder along the clavicle  Sensation has some hyperalgesia along the C4 dermatome On the left compared to the Right side  Negative impingement sign left shoulder  Tenderness over the left trapezius as well as left infraspinatus muscles.  Neurological: She is alert and oriented to person, place, and time. She has normal strength. Coordination normal.  Reflex Scores:      Tricep reflexes are 2+ on the right side and 2+ on the left side.      Bicep reflexes are 2+ on the right side and 2+ on the left side.      Brachioradialis reflexes are 2+ on the right side and 2+ on the left side.      Patellar reflexes are 2+ on the right side and 0 on the left side. 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  Psychiatric: She has a normal mood and affect.  Nursing note and vitals reviewed.         Assessment & Plan:  1. Complex multifactorial pain left side neck and shoulder.3 main etiologies  A.  myofascial pain left trapezius, left infraspinatus B. Cervical spinal stenosis with left C4-C5 radiculopathy C. Cervical spondylosis with facet arthropathy  Recommend trigger point injection today to the left trapezius left infraspinatus  Trigger Point Injection  Indication: Left trapezius, left infraspinatus Myofascial pain not  relieved by medication management and other conservative care.  Informed consent was obtained after describing risk and benefits of the procedure with the patient, this includes bleeding, bruising, infection and medication side effects.  The patient wishes to proceed and has given written consent.  The patient was placed in a Seated position.  The Left upper trapezius and left infra spinatus area was marked and prepped with Betadine.  It was entered with a 25-gauge 1-1/2 inch needle and 1 mL of 1% lidocaine was injected into each of 4 trigger points, after negative draw back for blood.  The patient tolerated the procedure well.  Post procedure instructions were given.  No relief with Tylenol 3, switched to Butrans-patch, 10 g every weekly  If no significant relief with trigger point injection, recommend cervical medial branch block in one month  If all the above modalities are not helpful in relieving the patient's pain would recommend orthopedic spine surgery second opinion

## 2015-05-04 NOTE — Patient Instructions (Signed)
Trigger Point Injection Trigger points are areas where you have muscle pain. A trigger point injection is a shot given in the trigger point to relieve that pain. A trigger point might feel like a knot in your muscle. It hurts to press on a trigger point. Sometimes the pain spreads out (radiates) to other parts of the body. For example, pressing on a trigger point in your shoulder might cause pain in your arm or neck. You might have one trigger point. Or, you might have more than one. People often have trigger points in their upper back and lower back. They also occur often in the neck and shoulders. Pain from a trigger point lasts for a long time. It can make it hard to keep moving. You might not be able to do the exercise or physical therapy that could help you deal with the pain. A trigger point injection may help. It does not work for everyone. But, it may relieve your pain for a few days or a few months. A trigger point injection does not cure long-lasting (chronic) pain. LET YOUR CAREGIVER KNOW ABOUT:  Any allergies (especially to latex, lidocaine, or steroids).  Blood-thinning medicines that you take. These drugs can lead to bleeding or bruising after an injection. They include:  Aspirin.  Ibuprofen.  Clopidogrel.  Warfarin.  Other medicines you take. This includes all vitamins, herbs, eyedrops, over-the-counter medicines, and creams.  Use of steroids.  Recent infections.  Past problems with numbing medicines.  Bleeding problems.  Surgeries you have had.  Other health problems. RISKS AND COMPLICATIONS A trigger point injection is a safe treatment. However, problems may develop, such as:  Minor side effects usually go away in 1 to 2 days. These may include:  Soreness.  Bruising.  Stiffness.  More serious problems are rare. But, they may include:  Bleeding under the skin (hematoma).  Skin infection.  Breaking off of the needle under your skin.  Lung  puncture.  The trigger point injection may not work for you. BEFORE THE PROCEDURE You may need to stop taking any medicine that thins your blood. This is to prevent bleeding and bruising. Usually these medicines are stopped several days before the injection. No other preparation is needed. PROCEDURE  A trigger point injection can be given in your caregiver's office or in a clinic. Each injection takes 2 minutes or less.  Your caregiver will feel for trigger points. The caregiver may use a marker to circle the area for the injection.  The skin over the trigger point will be washed with a germ-killing (antiseptic) solution.  The caregiver pinches the spot for the injection.  Then, a very thin needle is used for the shot. You may feel pain or a twitching feeling when the needle enters the trigger point.  A numbing solution may be injected into the trigger point. Sometimes a drug to keep down swelling, redness, and warmth (inflammation) is also injected.  Your caregiver moves the needle around the trigger zone until the tightness and twitching goes away.  After the injection, your caregiver may put gentle pressure over the injection site.  Then it is covered with a bandage. AFTER THE PROCEDURE  You can go right home after the injection.  The bandage can be taken off after a few hours.  You may feel sore and stiff for 1 to 2 days.  Go back to your regular activities slowly. Your caregiver may ask you to stretch your muscles. Do not do anything that takes  extra energy for a few days.  Follow your caregiver's instructions to manage and treat other pain.   This information is not intended to replace advice given to you by your health care provider. Make sure you discuss any questions you have with your health care provider.   Document Released: 05/02/2011 Document Revised: 09/07/2012 Document Reviewed: 05/02/2011 Elsevier Interactive Patient Education [employer].

## 2015-05-07 ENCOUNTER — Encounter: Payer: Self-pay | Admitting: Physical Medicine & Rehabilitation

## 2015-05-08 NOTE — Telephone Encounter (Signed)
Please advise on reactions for patch?

## 2015-05-10 NOTE — Progress Notes (Signed)
Urine drug screen for this encounter is consistent for prescribed medication.  Has been prescribed diazepam and low level metabolite present.

## 2015-05-17 ENCOUNTER — Encounter: Payer: Self-pay | Admitting: Physical Medicine & Rehabilitation

## 2015-05-18 NOTE — Telephone Encounter (Signed)
Please advise on dose of patch? Thanks!

## 2015-05-22 ENCOUNTER — Other Ambulatory Visit: Payer: Self-pay | Admitting: Physical Medicine & Rehabilitation

## 2015-05-22 ENCOUNTER — Encounter (HOSPITAL_COMMUNITY): Payer: Self-pay | Admitting: Emergency Medicine

## 2015-05-22 ENCOUNTER — Inpatient Hospital Stay (HOSPITAL_COMMUNITY)
Admission: EM | Admit: 2015-05-22 | Discharge: 2015-05-24 | DRG: 312 | Disposition: A | Discharge status: Home Health | Payer: Medicare Other | Attending: Internal Medicine | Admitting: Internal Medicine

## 2015-05-22 ENCOUNTER — Emergency Department (HOSPITAL_COMMUNITY): Payer: Medicare Other

## 2015-05-22 ENCOUNTER — Inpatient Hospital Stay (HOSPITAL_COMMUNITY): Payer: Medicare Other

## 2015-05-22 DIAGNOSIS — M546 Pain in thoracic spine: Secondary | ICD-10-CM | POA: Diagnosis not present

## 2015-05-22 DIAGNOSIS — R079 Chest pain, unspecified: Secondary | ICD-10-CM | POA: Diagnosis present

## 2015-05-22 DIAGNOSIS — G8929 Other chronic pain: Secondary | ICD-10-CM | POA: Diagnosis not present

## 2015-05-22 DIAGNOSIS — R55 Syncope and collapse: Principal | ICD-10-CM | POA: Diagnosis present

## 2015-05-22 DIAGNOSIS — Z882 Allergy status to sulfonamides status: Secondary | ICD-10-CM | POA: Diagnosis not present

## 2015-05-22 DIAGNOSIS — H5442 Blindness, left eye, normal vision right eye: Secondary | ICD-10-CM | POA: Diagnosis present

## 2015-05-22 DIAGNOSIS — W19XXXA Unspecified fall, initial encounter: Secondary | ICD-10-CM

## 2015-05-22 DIAGNOSIS — Z8673 Personal history of transient ischemic attack (TIA), and cerebral infarction without residual deficits: Secondary | ICD-10-CM

## 2015-05-22 DIAGNOSIS — R51 Headache: Secondary | ICD-10-CM | POA: Diagnosis present

## 2015-05-22 DIAGNOSIS — Z9849 Cataract extraction status, unspecified eye: Secondary | ICD-10-CM | POA: Diagnosis not present

## 2015-05-22 DIAGNOSIS — Z888 Allergy status to other drugs, medicaments and biological substances status: Secondary | ICD-10-CM | POA: Diagnosis not present

## 2015-05-22 DIAGNOSIS — J449 Chronic obstructive pulmonary disease, unspecified: Secondary | ICD-10-CM | POA: Diagnosis present

## 2015-05-22 DIAGNOSIS — R42 Dizziness and giddiness: Secondary | ICD-10-CM

## 2015-05-22 DIAGNOSIS — K219 Gastro-esophageal reflux disease without esophagitis: Secondary | ICD-10-CM | POA: Diagnosis present

## 2015-05-22 DIAGNOSIS — M549 Dorsalgia, unspecified: Secondary | ICD-10-CM | POA: Diagnosis present

## 2015-05-22 DIAGNOSIS — R072 Precordial pain: Secondary | ICD-10-CM

## 2015-05-22 DIAGNOSIS — Z880 Allergy status to penicillin: Secondary | ICD-10-CM

## 2015-05-22 DIAGNOSIS — Z90710 Acquired absence of both cervix and uterus: Secondary | ICD-10-CM

## 2015-05-22 DIAGNOSIS — Z91048 Other nonmedicinal substance allergy status: Secondary | ICD-10-CM | POA: Diagnosis not present

## 2015-05-22 DIAGNOSIS — I10 Essential (primary) hypertension: Secondary | ICD-10-CM | POA: Diagnosis present

## 2015-05-22 DIAGNOSIS — R251 Tremor, unspecified: Secondary | ICD-10-CM | POA: Diagnosis present

## 2015-05-22 DIAGNOSIS — M1990 Unspecified osteoarthritis, unspecified site: Secondary | ICD-10-CM | POA: Diagnosis present

## 2015-05-22 DIAGNOSIS — Z79899 Other long term (current) drug therapy: Secondary | ICD-10-CM

## 2015-05-22 DIAGNOSIS — Z886 Allergy status to analgesic agent status: Secondary | ICD-10-CM | POA: Diagnosis not present

## 2015-05-22 DIAGNOSIS — M810 Age-related osteoporosis without current pathological fracture: Secondary | ICD-10-CM | POA: Diagnosis present

## 2015-05-22 DIAGNOSIS — E538 Deficiency of other specified B group vitamins: Secondary | ICD-10-CM | POA: Diagnosis present

## 2015-05-22 DIAGNOSIS — J45909 Unspecified asthma, uncomplicated: Secondary | ICD-10-CM | POA: Diagnosis present

## 2015-05-22 DIAGNOSIS — F329 Major depressive disorder, single episode, unspecified: Secondary | ICD-10-CM | POA: Diagnosis present

## 2015-05-22 DIAGNOSIS — M4802 Spinal stenosis, cervical region: Secondary | ICD-10-CM | POA: Diagnosis present

## 2015-05-22 DIAGNOSIS — R413 Other amnesia: Secondary | ICD-10-CM | POA: Diagnosis present

## 2015-05-22 DIAGNOSIS — E876 Hypokalemia: Secondary | ICD-10-CM | POA: Diagnosis present

## 2015-05-22 DIAGNOSIS — Z885 Allergy status to narcotic agent status: Secondary | ICD-10-CM

## 2015-05-22 DIAGNOSIS — G894 Chronic pain syndrome: Secondary | ICD-10-CM | POA: Diagnosis not present

## 2015-05-22 DIAGNOSIS — E7800 Pure hypercholesterolemia, unspecified: Secondary | ICD-10-CM | POA: Diagnosis present

## 2015-05-22 DIAGNOSIS — H3530 Unspecified macular degeneration: Secondary | ICD-10-CM | POA: Diagnosis present

## 2015-05-22 DIAGNOSIS — M542 Cervicalgia: Secondary | ICD-10-CM | POA: Diagnosis not present

## 2015-05-22 DIAGNOSIS — E785 Hyperlipidemia, unspecified: Secondary | ICD-10-CM | POA: Diagnosis present

## 2015-05-22 DIAGNOSIS — T50995A Adverse effect of other drugs, medicaments and biological substances, initial encounter: Secondary | ICD-10-CM | POA: Diagnosis present

## 2015-05-22 DIAGNOSIS — T148 Other injury of unspecified body region: Secondary | ICD-10-CM | POA: Diagnosis not present

## 2015-05-22 DIAGNOSIS — Z87891 Personal history of nicotine dependence: Secondary | ICD-10-CM | POA: Diagnosis not present

## 2015-05-22 DIAGNOSIS — I209 Angina pectoris, unspecified: Secondary | ICD-10-CM | POA: Diagnosis not present

## 2015-05-22 LAB — CBC WITH DIFFERENTIAL/PLATELET
Basophils Absolute: 0 10*3/uL (ref 0.0–0.1)
Basophils Relative: 1 %
Eosinophils Absolute: 0.2 10*3/uL (ref 0.0–0.7)
Eosinophils Relative: 4 %
HCT: 44.4 % (ref 36.0–46.0)
Hemoglobin: 14.3 g/dL (ref 12.0–15.0)
Lymphocytes Relative: 44 %
Lymphs Abs: 2.4 10*3/uL (ref 0.7–4.0)
MCH: 29.6 pg (ref 26.0–34.0)
MCHC: 32.2 g/dL (ref 30.0–36.0)
MCV: 91.9 fL (ref 78.0–100.0)
Monocytes Absolute: 0.4 10*3/uL (ref 0.1–1.0)
Monocytes Relative: 8 %
Neutro Abs: 2.3 10*3/uL (ref 1.7–7.7)
Neutrophils Relative %: 43 %
Platelets: 193 10*3/uL (ref 150–400)
RBC: 4.83 MIL/uL (ref 3.87–5.11)
RDW: 13.3 % (ref 11.5–15.5)
WBC: 5.4 10*3/uL (ref 4.0–10.5)

## 2015-05-22 LAB — BASIC METABOLIC PANEL
Anion gap: 10 (ref 5–15)
BUN: 8 mg/dL (ref 6–20)
CO2: 27 mmol/L (ref 22–32)
Calcium: 9.6 mg/dL (ref 8.9–10.3)
Chloride: 104 mmol/L (ref 101–111)
Creatinine, Ser: 1.11 mg/dL — ABNORMAL HIGH (ref 0.44–1.00)
GFR calc Af Amer: 56 mL/min — ABNORMAL LOW (ref >60)
GFR calc non Af Amer: 48 mL/min — ABNORMAL LOW (ref >60)
Glucose, Bld: 95 mg/dL (ref 65–99)
Potassium: 3.2 mmol/L — ABNORMAL LOW (ref 3.5–5.1)
Sodium: 141 mmol/L (ref 135–145)

## 2015-05-22 LAB — I-STAT ARTERIAL BLOOD GAS, ED
Acid-Base Excess: 7 mmol/L — ABNORMAL HIGH (ref 0.0–2.0)
Bicarbonate: 32.2 mEq/L — ABNORMAL HIGH (ref 20.0–24.0)
O2 Saturation: 93 %
Patient temperature: 98.4
TCO2: 34 mmol/L (ref 0–100)
pCO2 arterial: 47.5 mmHg — ABNORMAL HIGH (ref 35.0–45.0)
pH, Arterial: 7.439 (ref 7.350–7.450)
pO2, Arterial: 64 mmHg — ABNORMAL LOW (ref 80.0–100.0)

## 2015-05-22 LAB — HEPATIC FUNCTION PANEL
ALT: 24 U/L (ref 14–54)
AST: 32 U/L (ref 15–41)
Albumin: 3.4 g/dL — ABNORMAL LOW (ref 3.5–5.0)
Alkaline Phosphatase: 106 U/L (ref 38–126)
Bilirubin, Direct: 0.1 mg/dL (ref 0.1–0.5)
Indirect Bilirubin: 0.4 mg/dL (ref 0.3–0.9)
Total Bilirubin: 0.5 mg/dL (ref 0.3–1.2)
Total Protein: 7.4 g/dL (ref 6.5–8.1)

## 2015-05-22 LAB — CREATININE, SERUM
Creatinine, Ser: 1.15 mg/dL — ABNORMAL HIGH (ref 0.44–1.00)
GFR calc Af Amer: 53 mL/min — ABNORMAL LOW (ref >60)
GFR calc non Af Amer: 46 mL/min — ABNORMAL LOW (ref >60)

## 2015-05-22 LAB — RAPID URINE DRUG SCREEN, HOSP PERFORMED
Amphetamines: NOT DETECTED
Barbiturates: NOT DETECTED
Benzodiazepines: POSITIVE — AB
Cocaine: NOT DETECTED
Opiates: NOT DETECTED
Tetrahydrocannabinol: NOT DETECTED

## 2015-05-22 LAB — CBC
HCT: 42.9 % (ref 36.0–46.0)
Hemoglobin: 13.7 g/dL (ref 12.0–15.0)
MCH: 29.3 pg (ref 26.0–34.0)
MCHC: 31.9 g/dL (ref 30.0–36.0)
MCV: 91.9 fL (ref 78.0–100.0)
Platelets: 210 10*3/uL (ref 150–400)
RBC: 4.67 MIL/uL (ref 3.87–5.11)
RDW: 13.4 % (ref 11.5–15.5)
WBC: 5.5 10*3/uL (ref 4.0–10.5)

## 2015-05-22 LAB — TROPONIN I: Troponin I: <0.03 ng/mL (ref <0.031)

## 2015-05-22 LAB — ETHANOL: Alcohol, Ethyl (B): <5 mg/dL (ref <5)

## 2015-05-22 LAB — MAGNESIUM: Magnesium: 2.2 mg/dL (ref 1.7–2.4)

## 2015-05-22 LAB — TSH: TSH: 1.273 u[IU]/mL (ref 0.350–4.500)

## 2015-05-22 MED ORDER — GABAPENTIN 400 MG PO CAPS
400.0000 mg | ORAL_CAPSULE | Freq: Three times a day (TID) | ORAL | Status: DC
  Administered 2015-05-23 – 2015-05-24 (×5): 400 mg via ORAL
  Filled 2015-05-22 (×6): qty 1

## 2015-05-22 MED ORDER — POTASSIUM CHLORIDE IN NACL 40-0.9 MEQ/L-% IV SOLN
INTRAVENOUS | Status: DC
  Administered 2015-05-22 – 2015-05-24 (×4): 100 mL/h via INTRAVENOUS
  Filled 2015-05-22 (×6): qty 1000

## 2015-05-22 MED ORDER — DICYCLOMINE HCL 20 MG PO TABS
20.0000 mg | ORAL_TABLET | Freq: Every day | ORAL | Status: DC | PRN
  Administered 2015-05-23: 20 mg via ORAL
  Filled 2015-05-22 (×3): qty 1

## 2015-05-22 MED ORDER — SODIUM CHLORIDE 0.9 % IJ SOLN: 3.0000 mL | Freq: Two times a day (BID) | INTRAMUSCULAR | Status: DC

## 2015-05-22 MED ORDER — TIZANIDINE HCL 2 MG PO TABS
2.0000 mg | ORAL_TABLET | Freq: Three times a day (TID) | ORAL | Status: DC
  Administered 2015-05-23 – 2015-05-24 (×5): 2 mg via ORAL
  Filled 2015-05-22 (×7): qty 1

## 2015-05-22 MED ORDER — ONDANSETRON HCL 4 MG/2ML IJ SOLN
4.0000 mg | Freq: Four times a day (QID) | INTRAMUSCULAR | Status: DC | PRN
  Administered 2015-05-22 – 2015-05-23 (×2): 4 mg via INTRAVENOUS
  Filled 2015-05-22 (×2): qty 2

## 2015-05-22 MED ORDER — SENNA 8.6 MG PO TABS
1.0000 | ORAL_TABLET | Freq: Every day | ORAL | Status: DC
  Administered 2015-05-23 – 2015-05-24 (×2): 8.6 mg via ORAL
  Filled 2015-05-22: qty 2
  Filled 2015-05-22 (×3): qty 1

## 2015-05-22 MED ORDER — POLYETHYLENE GLYCOL 3350 17 G PO PACK
17.0000 g | PACK | Freq: Every day | ORAL | Status: DC
  Administered 2015-05-23 – 2015-05-24 (×2): 17 g via ORAL
  Filled 2015-05-22 (×2): qty 1

## 2015-05-22 MED ORDER — ACETAMINOPHEN 650 MG RE SUPP: 650.0000 mg | Freq: Four times a day (QID) | RECTAL | Status: DC | PRN

## 2015-05-22 MED ORDER — ACETAMINOPHEN 325 MG PO TABS
650.0000 mg | ORAL_TABLET | Freq: Once | ORAL | Status: AC
Start: 2015-05-22 — End: 2015-05-22
  Administered 2015-05-22: 650 mg via ORAL
  Filled 2015-05-22: qty 2

## 2015-05-22 MED ORDER — MECLIZINE HCL 12.5 MG PO TABS
12.5000 mg | ORAL_TABLET | Freq: Three times a day (TID) | ORAL | Status: DC | PRN
  Administered 2015-05-23: 12.5 mg via ORAL
  Filled 2015-05-22: qty 1

## 2015-05-22 MED ORDER — MAGNESIUM SULFATE 50 % IJ SOLN: 3.0000 g | Freq: Once | INTRAVENOUS | Status: DC

## 2015-05-22 MED ORDER — ALPRAZOLAM 0.5 MG PO TABS
1.0000 mg | ORAL_TABLET | Freq: Every day | ORAL | Status: DC
  Administered 2015-05-22 – 2015-05-23 (×2): 1 mg via ORAL
  Filled 2015-05-22 (×2): qty 2

## 2015-05-22 MED ORDER — POLYETHYLENE GLYCOL 3350 17 GM/SCOOP PO POWD
17.0000 g | Freq: Every day | ORAL | Status: DC
  Filled 2015-05-22: qty 255

## 2015-05-22 MED ORDER — PANTOPRAZOLE SODIUM 40 MG PO TBEC
40.0000 mg | DELAYED_RELEASE_TABLET | Freq: Every day | ORAL | Status: DC
  Administered 2015-05-23 – 2015-05-24 (×2): 40 mg via ORAL
  Filled 2015-05-22 (×3): qty 1

## 2015-05-22 MED ORDER — ENOXAPARIN SODIUM 40 MG/0.4ML ~~LOC~~ SOLN
40.0000 mg | SUBCUTANEOUS | Status: DC
  Administered 2015-05-23 (×2): 40 mg via SUBCUTANEOUS
  Filled 2015-05-22 (×2): qty 0.4

## 2015-05-22 MED ORDER — SODIUM CHLORIDE 0.9 % IV SOLN: INTRAVENOUS | Status: DC

## 2015-05-22 MED ORDER — ACETAMINOPHEN 325 MG PO TABS
650.0000 mg | ORAL_TABLET | Freq: Four times a day (QID) | ORAL | Status: DC | PRN
  Administered 2015-05-23 – 2015-05-24 (×2): 650 mg via ORAL
  Filled 2015-05-22 (×3): qty 2

## 2015-05-22 MED ORDER — BUPRENORPHINE 10 MCG/HR TD PTWK: 10.0000 ug | MEDICATED_PATCH | TRANSDERMAL | Status: DC

## 2015-05-22 MED ORDER — ONDANSETRON HCL 4 MG PO TABS: 4.0000 mg | ORAL_TABLET | Freq: Four times a day (QID) | ORAL | Status: DC | PRN

## 2015-05-22 MED ORDER — POTASSIUM CHLORIDE CRYS ER 20 MEQ PO TBCR
40.0000 meq | EXTENDED_RELEASE_TABLET | Freq: Once | ORAL | Status: AC
  Administered 2015-05-23: 40 meq via ORAL
  Filled 2015-05-22: qty 2
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
## 2015-05-22 NOTE — ED Provider Notes (Signed)
Patient accepted in sign out pending imaging results.  Imaging studies unremarkable for patient.  Patient evaluated at bedside and appeared fatigued but easily aroused.  Unclear etiology of recurrent syncope.  Discussed with Dr. Allyson Sabal who agreed with admission for observation and patient admitted under her care to telemetry.  Patient and daughter updated on results and plan of care.  Harvel Quale, MD 05/22/15 [phone]

## 2015-05-22 NOTE — H&P (Signed)
[facility] History and Physical  [name] NLZ:[id number] DOB: 07-Aug-1940 DOA: 05/22/2015  Referring physician:   PCP: Wenda Low, MD   Chief Complaint: **  HPI:  74 year old female with history of chronic back pain, cervical spinal stenosis, followed by Dr. Delynn Flavin, management of her pain, secondary to cervical spinal stenosis with left C4-C5 radiculopathy and cervical spondylosis with facet arthropathy, on multiple pain medications, antidepressant medications, muscle relaxants and antianxiety medications, recently found to have been prescribed Tylenol No. 4 and Butrans-patch, 10 g every weekly, who presents with multiple episodes of syncope, over the last 5 days. Patient in her own words states in am always sleepy. Her syncopal episode was witnessed by EMS as well as by EDP. She did strike her head but CT of the head was negative for acute trauma. CT of the spine showed compression deformity of C7. Patient is followed by [facility] neurology for her long-standing history of balance difficulties and falls. She continues to have memory loss, headache, neck pain, tremors with multiple ER visits. She sustained new compression fractures in July 2016 and was seen by Dr. Saintclair Halsted, conservative management recommended. 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 Last 2-D echo 10/08/14 showed EF of 90-24% grade 1 diastolic dysfunction, no regional wall motion abnormalities    Review of Systems: negative for the following  Constitutional: Denies fever, chills, diaphoresis, appetite change and fatigue.  HEENT: Denies photophobia, eye pain, redness, hearing loss, ear pain, congestion, sore throat, rhinorrhea, sneezing, mouth sores, trouble swallowing, neck pain, neck stiffness and tinnitus.  Respiratory: Denies SOB, DOE, cough, chest tightness, and wheezing.  Cardiovascular: Denies chest pain,  palpitations and leg swelling.  Gastrointestinal: Denies nausea, vomiting, abdominal pain, diarrhea, constipation, blood in stool and abdominal distention.  Genitourinary: Denies dysuria, urgency, frequency, hematuria, flank pain and difficulty urinating.  Musculoskeletal: Positive for back pain and neck pain. Skin: Denies pallor, rash and wound.  Neurological: Denies dizziness, seizures, syncope, weakness, light-headedness, numbness and positive for headaches.  Hematological: Denies adenopathy. Easy bruising, personal or family bleeding history  Psychiatric/Behavioral: Denies suicidal ideation, mood changes, confusion, nervousness, sleep disturbance and agitation       Past Medical History  Diagnosis Date  . Hiatal hernia   . Anemia   . GERD (gastroesophageal reflux disease)   . Hyperlipidemia   . Depression     major  . Stroke [facility])     "mini stroke"  . Osteoarthritis   . Reactive airway disease   . B12 deficiency   . Osteoporosis   . Rupture of bowel ([place])   . Bladder incontinence   . DDD (degenerative disc disease), lumbar   . Cataract   . COPD (chronic obstructive pulmonary disease) ([place])   . Hypertension   . Stroke ([place])   . Macular degeneration   . Blind left eye   . Cervical spine fracture ([place])   . Compression fracture     C7,  upper T spine -compression fx     Past Surgical History  Procedure Laterality Date  . Abdominal surgery    . Colon surgery    . Joint replacement    . Appendectomy    . Bladder surgery    . Rectocele repair    . Knee surgery    . Cholecystectomy    . Abdominal hysterectomy    . Ostomy    . Eye surgery      cataracts  Social History:  reports that she has quit smoking. She does not have any smokeless tobacco history on file. She reports that she does not drink alcohol or use illicit drugs.    Allergies  Allergen Reactions  . Ampicillin Nausea Only and Other (See Comments)    Thrush.   . Oxycodone-Acetaminophen  Itching    Can take with benadryl  . Toradol [Ketorolac Tromethamine] Nausea Only and Other (See Comments)    Headaches, back aches  . Ambien [Zolpidem Tartrate] Other (See Comments)    unknown  . Ampicillin-Sulbactam Sodium Other (See Comments)    Muscles draw up tight - severe headache  . Tape Other (See Comments)    Paper tape only.  . Cephalexin Other (See Comments)    unknown  . Percocet [Oxycodone-Acetaminophen] Itching and Rash    Vicodin is OK  . Sulfamethoxazole-Trimethoprim Other (See Comments)    unknown  . Tramadol Rash and Other (See Comments)    Headache.     Family History  Problem Relation Age of Onset  . Heart failure Mother   . Heart attack Father          Prior to Admission medications   Medication Sig Start Date End Date Taking? Authorizing Provider  albuterol (ACCUNEB) 0.63 MG/3ML nebulizer solution Take 1 ampule by nebulization every 6 (six) hours as needed for wheezing.   Yes Historical Provider, MD  albuterol (PROVENTIL HFA;VENTOLIN HFA) 108 (90 BASE) MCG/ACT inhaler Inhale 1 puff into the lungs every 6 (six) hours as needed for wheezing or shortness of breath.   Yes Historical Provider, MD  albuterol (PROVENTIL) (2.5 MG/3ML) 0.083% nebulizer solution Take 2.5 mg by nebulization every 6 (six) hours as needed for wheezing or shortness of breath.   Yes Historical Provider, MD  ALPRAZolam Duanne Moron) 1 MG tablet Take 1 mg by mouth at bedtime.   Yes Historical Provider, MD  amLODipine (NORVASC) 5 MG tablet Take 5 mg by mouth daily.  04/06/13  Yes Historical Provider, MD  buprenorphine (BUTRANS) 10 MCG/HR PTWK patch Place 1 patch (10 mcg total) onto the skin once a week. 05/04/15  Yes Charlett Blake, MD  dicyclomine (BENTYL) 20 MG tablet Take 20 mg by mouth daily as needed for spasms.    Yes Historical Provider, MD  DOCUSATE SODIUM PO Take 1 capsule by mouth daily.   Yes Historical Provider, MD  DULoxetine (CYMBALTA) 30 MG capsule Take 30 mg by mouth daily.    Yes Historical Provider, MD  esomeprazole (NEXIUM) 40 MG capsule Take 40 mg by mouth daily.    Yes Historical Provider, MD  gabapentin (NEURONTIN) 400 MG capsule Take 1 capsule (400 mg total) by mouth 3 (three) times daily. 03/24/15  Yes Charlett Blake, MD  IRON PO Take 1 tablet by mouth every other day.    Yes Historical Provider, MD  KRILL OIL PO Take 1 tablet by mouth daily.   Yes Historical Provider, MD  lovastatin (MEVACOR) 10 MG tablet Take 10 mg by mouth daily. 09/22/14  Yes Historical Provider, MD  meclizine (ANTIVERT) 12.5 MG tablet Take 12.5 mg by mouth 3 (three) times daily as needed for nausea.   Yes Historical Provider, MD  polyethylene glycol powder (GLYCOLAX/MIRALAX) powder Take 17 g by mouth daily. For constipation   Yes Historical Provider, MD  senna (SENOKOT) 8.6 MG tablet Take 1-2 tablets by mouth daily.    Yes Historical Provider, MD  tizanidine (ZANAFLEX) 2 MG capsule Take 1 capsule (2 mg total) by  mouth 3 (three) times daily as needed for muscle spasms. 03/24/15  Yes Charlett Blake, MD  traZODone (DESYREL) 50 MG tablet Take 0.5 tablets (25 mg total) by mouth at bedtime. 12/30/14  Yes Penni Bombard, MD  vitamin B-12 (CYANOCOBALAMIN) 1000 MCG tablet Take 1,000 mcg by mouth daily.   Yes Historical Provider, MD  acetaminophen-codeine (TYLENOL #4) 300-60 MG tablet Take 1-2 tablets by mouth every 8 (eight) hours as needed for moderate pain. Patient not taking: Reported on 05/22/2015 04/25/15   Charlett Blake, MD     Physical Exam: Filed Vitals:   05/22/15 1715 05/22/15 1730 05/22/15 1815 05/22/15 1830  BP: 131/81 134/81 140/92 146/94  Pulse:  74 74 74  Temp:      TempSrc:      Resp: '23 17 20 18  '$ SpO2:  97% 94% 95%     Constitutional: Vital signs reviewed. Patient is a well-developed and well-nourished in no acute distress and cooperative with exam. Alert and oriented x3.  Head: Normocephalic and atraumatic  Ear: TM normal bilaterally  Mouth: no erythema or  exudates, MMM  Eyes: PERRL, EOMI, conjunctivae normal, No scleral icterus.  Neck: Supple, Trachea midline normal ROM, No JVD, mass, thyromegaly, or carotid bruit present.  Cardiovascular: RRR, S1 normal, S2 normal, no MRG, pulses symmetric and intact bilaterally  Pulmonary/Chest: CTAB, no wheezes, rales, or rhonchi  Abdominal: Soft. Non-tender, non-distended, bowel sounds are normal, no masses, organomegaly, or guarding present.  GU: no CVA tenderness Musculoskeletal: No joint deformities, erythema, or stiffness, ROM full and no nontender Ext: no edema and no cyanosis, pulses palpable bilaterally (DP and PT)  Hematology: no cervical, inginal, or axillary adenopathy.  Neurological: She is alert. Coordination normal.  Motor strength 5 over 5 overall DTRs symmetric bilaterally at knee jerk and ankle jerk and biceps toes downward going bilaterally. Upon standing up patient to walk she suffered a syncopal event which lasted approximately 30 seconds. After regaining consciousness patient is jovial alert Glasgow Coma Score 15 and appeared in no distress  Skin: Skin is warm and dry. No rash noted. Skin: Warm, dry and intact. No rash, cyanosis, or clubbing.  Psychiatric: Normal mood and affect. speech and behavior is normal. Judgment and thought content normal. Cognition and memory are normal.      Data Review   Micro Results No results found for this or any previous visit (from the past 240 hour(s)).  Radiology Reports Dg Thoracic Spine 2 View  05/22/2015  CLINICAL DATA:  74 year old female with acute on chronic back pain after falling 5 days previously. Pain localizes to the thoracic spine. EXAM: THORACIC SPINE 2 VIEWS COMPARISON:  Prior CT scan of the thoracic spine 02/15/2015 FINDINGS: There is no evidence of thoracic spine fracture. Alignment is normal. No other significant bone abnormalities are identified. Atherosclerotic vascular calcifications present in both renal arteries proximally.  IMPRESSION: Negative. Electronically Signed   By: Jacqulynn Cadet M.D.   On: 05/22/2015 16:41   Ct Head Wo Contrast  05/22/2015  CLINICAL DATA:  Syncopal episode today. Fall. Headache and neck pain. EXAM: CT HEAD WITHOUT CONTRAST CT CERVICAL SPINE WITHOUT CONTRAST TECHNIQUE: Multidetector CT imaging of the head and cervical spine was performed following the standard protocol without intravenous contrast. Multiplanar CT image reconstructions of the cervical spine were also generated. COMPARISON:  Head and neck CT 02/20/2015 FINDINGS: CT HEAD FINDINGS No intracranial hemorrhage. No parenchymal contusion. No midline shift or mass effect. Basilar cisterns are patent. No skull base fracture.  No fluid in the paranasal sinuses or mastoid air cells. Orbits are normal. There is generalized cortical atrophy. Mild periventricular white matter hypodensities. CT CERVICAL SPINE FINDINGS No prevertebral soft tissue swelling. Normal alignment of cervical vertebral bodies. No loss of vertebral body height. Normal facet articulation. Normal craniocervical junction. There is compression deformity of the C7 vertebral body anteriorly with approximately 25% vertebral body height loss. This is not changed from comparison exam. No evidence epidural or paraspinal hematoma. IMPRESSION: 1. No intracranial trauma. 2. Atrophy and microvascular disease unchanged. 3. No cervical spine fracture. 4. Chronic compression deformity of C7 is stable. Electronically Signed   By: Suzy Bouchard M.D.   On: 05/22/2015 17:02   Ct Cervical Spine Wo Contrast  05/22/2015  CLINICAL DATA:  Syncopal episode today. Fall. Headache and neck pain. EXAM: CT HEAD WITHOUT CONTRAST CT CERVICAL SPINE WITHOUT CONTRAST TECHNIQUE: Multidetector CT imaging of the head and cervical spine was performed following the standard protocol without intravenous contrast. Multiplanar CT image reconstructions of the cervical spine were also generated. COMPARISON:  Head and  neck CT 02/20/2015 FINDINGS: CT HEAD FINDINGS No intracranial hemorrhage. No parenchymal contusion. No midline shift or mass effect. Basilar cisterns are patent. No skull base fracture. No fluid in the paranasal sinuses or mastoid air cells. Orbits are normal. There is generalized cortical atrophy. Mild periventricular white matter hypodensities. CT CERVICAL SPINE FINDINGS No prevertebral soft tissue swelling. Normal alignment of cervical vertebral bodies. No loss of vertebral body height. Normal facet articulation. Normal craniocervical junction. There is compression deformity of the C7 vertebral body anteriorly with approximately 25% vertebral body height loss. This is not changed from comparison exam. No evidence epidural or paraspinal hematoma. IMPRESSION: 1. No intracranial trauma. 2. Atrophy and microvascular disease unchanged. 3. No cervical spine fracture. 4. Chronic compression deformity of C7 is stable. Electronically Signed   By: Suzy Bouchard M.D.   On: 05/22/2015 17:02   Dg Shoulder Left  04/24/2015  CLINICAL DATA:  Chronic left shoulder pain without known injury EXAM: LEFT SHOULDER - 2+ VIEW COMPARISON:  Chest x-ray which included portions of the left shoulder of Oct 25, 2014. FINDINGS: The bones of the left shoulder are adequately mineralized. The glenohumeral joint is unremarkable. The [facility] joint exhibits mild calcific change in the surrounding soft tissues. The joint space itself appears reasonably well-maintained. The subacromial subdeltoid space is preserved. The observed portions of the upper left ribs are normal. IMPRESSION: There is no acute bony abnormality of the left shoulder. Mild degenerative change of the [facility] joint is suspected. Given the chronic persistent symptoms, MRI may be a useful next imaging step. Electronically Signed   By: David  Martinique M.D.   On: 04/24/2015 15:02     CBC  Recent Labs Lab 05/22/15 1604  WBC 5.4  HGB 14.3  HCT 44.4  PLT 193  MCV 91.9  MCH 29.6   MCHC 32.2  RDW 13.3  LYMPHSABS 2.4  MONOABS 0.4  EOSABS 0.2  BASOSABS 0.0    Chemistries   Recent Labs Lab 05/22/15 1604  NA 141  K 3.2*  CL 104  CO2 27  GLUCOSE 95  BUN 8  CREATININE 1.11*  CALCIUM 9.6   ------------------------------------------------------------------------------------------------------------------ CrCl cannot be calculated (Unknown ideal weight.). ------------------------------------------------------------------------------------------------------------------ No results for input(s): HGBA1C in the last 72 hours. ------------------------------------------------------------------------------------------------------------------ No results for input(s): CHOL, HDL, LDLCALC, TRIG, CHOLHDL, LDLDIRECT in the last 72 hours. ------------------------------------------------------------------------------------------------------------------ No results for input(s): TSH, T4TOTAL, T3FREE, THYROIDAB in the last 72 hours.  Invalid input(s): FREET3 ------------------------------------------------------------------------------------------------------------------ No results for input(s): VITAMINB12, FOLATE, FERRITIN, TIBC, IRON, RETICCTPCT in the last 72 hours.  Coagulation profile No results for input(s): INR, PROTIME in the last 168 hours.  No results for input(s): DDIMER in the last 72 hours.  Cardiac Enzymes No results for input(s): CKMB, TROPONINI, MYOGLOBIN in the last 168 hours.  Invalid input(s): CK ------------------------------------------------------------------------------------------------------------------ Invalid input(s): POCBNP   CBG: No results for input(s): GLUCAP in the last 168 hours.     EKG: Independently reviewed.    Assessment/Plan Principal Problem:   Syncope-likely secondary to polypharmacy Patient takes Cymbalta, trazodone, Xanax, buprenorphine, gabapentin, meclizine, Zanaflex and Tylenol No. 4 at home Check  orthostatics 2-D echo, carotid Doppler PT evaluation for vestibular symptoms     Chest pain-admit to telemetry, cycle cardiac enzymes, 2-D echo to rule out wall motion abnormalities    HLD (hyperlipidemia)-continue lovastatin    HTN (hypertension)-hold antihypertensive medications in the setting of syncopal episodes, hold Norvasc    GERD (gastroesophageal reflux disease)-continue Nexium     Code Status:   full Family Communication: bedside Disposition Plan: admit   Total time spent 55 minutes.Greater than 50% of this time was spent in counseling, explanation of diagnosis, planning of further management, and coordination of care  [facility] Pager [phone]  If 7PM-7AM, please contact night-coverage [email or web address] Password [facility] 05/22/2015, 7:07 PM

## 2015-05-22 NOTE — ED Notes (Signed)
Per [facility] patient has chronic back pain and fell 5 days ago, causing an increase in pain.  Patient is followed by a pain clinic and is currently wearing a buprenorphine patch.  Per the daughter the patient has been less alert and acting unusual since the fall.  En route [facility] states the patient had a 45 second syncopal episode.  Patient alert and oriented at this time.

## 2015-05-23 ENCOUNTER — Encounter (HOSPITAL_COMMUNITY): Payer: Self-pay | Admitting: *Deleted

## 2015-05-23 ENCOUNTER — Inpatient Hospital Stay (HOSPITAL_COMMUNITY): Payer: Medicare Other

## 2015-05-23 DIAGNOSIS — I209 Angina pectoris, unspecified: Secondary | ICD-10-CM

## 2015-05-23 DIAGNOSIS — R55 Syncope and collapse: Secondary | ICD-10-CM | POA: Diagnosis not present

## 2015-05-23 DIAGNOSIS — J449 Chronic obstructive pulmonary disease, unspecified: Secondary | ICD-10-CM | POA: Diagnosis not present

## 2015-05-23 DIAGNOSIS — T50995A Adverse effect of other drugs, medicaments and biological substances, initial encounter: Secondary | ICD-10-CM | POA: Diagnosis not present

## 2015-05-23 DIAGNOSIS — K219 Gastro-esophageal reflux disease without esophagitis: Secondary | ICD-10-CM | POA: Diagnosis not present

## 2015-05-23 DIAGNOSIS — G8929 Other chronic pain: Secondary | ICD-10-CM | POA: Diagnosis not present

## 2015-05-23 DIAGNOSIS — E785 Hyperlipidemia, unspecified: Secondary | ICD-10-CM | POA: Diagnosis not present

## 2015-05-23 LAB — COMPREHENSIVE METABOLIC PANEL
ALT: 29 U/L (ref 14–54)
AST: 56 U/L — ABNORMAL HIGH (ref 15–41)
Albumin: 3.2 g/dL — ABNORMAL LOW (ref 3.5–5.0)
Alkaline Phosphatase: 113 U/L (ref 38–126)
Anion gap: 9 (ref 5–15)
BUN: 7 mg/dL (ref 6–20)
CO2: 29 mmol/L (ref 22–32)
Calcium: 9.5 mg/dL (ref 8.9–10.3)
Chloride: 105 mmol/L (ref 101–111)
Creatinine, Ser: 1.15 mg/dL — ABNORMAL HIGH (ref 0.44–1.00)
GFR calc Af Amer: 53 mL/min — ABNORMAL LOW (ref >60)
GFR calc non Af Amer: 46 mL/min — ABNORMAL LOW (ref >60)
Glucose, Bld: 91 mg/dL (ref 65–99)
Potassium: 3.4 mmol/L — ABNORMAL LOW (ref 3.5–5.1)
Sodium: 143 mmol/L (ref 135–145)
Total Bilirubin: 0.4 mg/dL (ref 0.3–1.2)
Total Protein: 6.9 g/dL (ref 6.5–8.1)

## 2015-05-23 LAB — CBC
HCT: 42.4 % (ref 36.0–46.0)
Hemoglobin: 13.7 g/dL (ref 12.0–15.0)
MCH: 29.7 pg (ref 26.0–34.0)
MCHC: 32.3 g/dL (ref 30.0–36.0)
MCV: 91.8 fL (ref 78.0–100.0)
Platelets: 210 10*3/uL (ref 150–400)
RBC: 4.62 MIL/uL (ref 3.87–5.11)
RDW: 13.4 % (ref 11.5–15.5)
WBC: 6.2 10*3/uL (ref 4.0–10.5)

## 2015-05-23 LAB — TROPONIN I
Troponin I: <0.03 ng/mL (ref <0.031)
Troponin I: <0.03 ng/mL (ref <0.031)

## 2015-05-23 LAB — HEMOGLOBIN A1C
Hgb A1c MFr Bld: 5.6 % (ref 4.8–5.6)
Mean Plasma Glucose: 114 mg/dL

## 2015-05-23 LAB — CORTISOL: Cortisol, Plasma: 8.1 ug/dL

## 2015-05-23 MED ORDER — POTASSIUM CHLORIDE CRYS ER 20 MEQ PO TBCR
40.0000 meq | EXTENDED_RELEASE_TABLET | Freq: Once | ORAL | Status: AC
Start: 2015-05-23 — End: 2015-05-23
  Administered 2015-05-23: 40 meq via ORAL
  Filled 2015-05-23: qty 2

## 2015-05-23 NOTE — Care Management Note (Addendum)
Case Management Note  Patient Details  Name: [name] MRN: [id number] Date of Birth: November 30, 1940  Subjective/Objective:    Pt admitted with syncope                Action/Plan:  Pt is independent from home with adult daughter.  Pt states the only time her and the daughter part "are when we go to sleep at night", pt states her daughter is very supportive.     Expected Discharge Date:                  Expected Discharge Plan:  [place]  In-House Referral:     Discharge planning Services  CM Consult  Post Acute Care Choice:    Choice offered to:  Patient  DME Arranged:    DME Agency:     HH Arranged:  PT, OT HH Agency:  [facility]  Status of Service: Complete, will sign off  Medicare Important Message Given:    Date Medicare IM Given:    Medicare IM give by:    Date Additional Medicare IM Given:    Additional Medicare Important Message give by:     If discussed at [place] of Stay Meetings, dates discussed:    Additional Comments: CM spoke with daughter, daughter confirmed that pt is baseline and she will provide 24 hour supervision upon discharge.  CM offered pt choice of HH, pt chose [facility], agency contacted and referral accepted pending MD order for [facility]. CM will request [place] orders from MD  Maryclare Labrador, RN 05/23/2015, 2:44 PM

## 2015-05-23 NOTE — Progress Notes (Signed)
Occupational Therapy Evaluation Patient Details Name: [name] MRN: [id number] DOB: [birth year] Today's Date: 05/23/2015    History of Present Illness 74 y.o. female admitted to [facility] on 05/22/15 for multiple episodes of syncope over the last 5 days.  CT of head was negative for acute events, but neck showed C7 compression deformity. Workup continued to try to find source of syncope.  Pt with significant PMHx of anemia, stroke, osteoporosis, DDD, COPD, HTN, macular degeneration, blind in L eye, and L TKA.   Clinical Impression   Pt admitted with the above diagnoses and presents with below problem list. Pt will benefit from continued acute OT to address the below listed deficits and maximize independence with BADLs prior to d/c to venue below. PTA pt was independent with ADLs. Pt is currently supervision to min guard with LB ADLs and toilet/tub shower transfers. Of note, pt c/o pain across sternal area and chest with shoulder forward flexion. OT to continue to follow acutely.      Follow Up Recommendations  Supervision/Assistance - 24 hour;Home health OT    Equipment Recommendations  None recommended by OT    Recommendations for Other Services       Precautions / Restrictions Precautions Precautions: Fall Precaution Comments: Pt with h/o 8 falls in the past month.       Mobility Bed Mobility Overal bed mobility: Modified Independent             General bed mobility comments: in recliner  Transfers Overall transfer level: Needs assistance Equipment used: None Transfers: Sit to/from Stand Sit to Stand: Min guard         General transfer comment: min guard for safety. from comfort height toilet with grab bars and from recliner    Balance Overall balance assessment: Needs assistance Sitting-balance support: Feet supported Sitting balance-Leahy Scale: Good Sitting balance - Comments: fatigues easily vs lethargic?   Standing balance support: No upper extremity  supported Standing balance-Leahy Scale: Fair Standing balance comment: good static balance, fair dynamic standing balance                            ADL Overall ADL's : Needs assistance/impaired Eating/Feeding: Set up;Sitting   Grooming: Wash/dry hands;Min guard;Standing   Upper Body Bathing: Set up;Sitting   Lower Body Bathing: Min guard;Sit to/from stand   Upper Body Dressing : Set up;Sitting   Lower Body Dressing: Min guard;Sit to/from stand   Toilet Transfer: Ambulation;Min guard;Grab bars   Toileting- Clothing Manipulation and Hygiene: Set up;Sitting/lateral lean   Tub/ Shower Transfer: Walk-in shower;Min guard;Ambulation;Grab bars   Functional mobility during ADLs: Supervision/safety;Min guard General ADL Comments: Pt mildy lethargic during session (eyes closed at times), impulsive at times (walking with decreased regard for IV line) and some decreased safety awareness. Discussed safety with home setup including fall preventin strategies, shower transfer technique and using 3n1 in shower at home for LB bathing.      Vision     Perception     Praxis      Pertinent Vitals/Pain Pain Assessment: 0-10 Pain Score: 9  ("8 and a half") Faces Pain Scale: Hurts little more Pain Location: head>back  Pain Descriptors / Indicators: Aching Pain Intervention(s): Monitored during session;Repositioned;Limited activity within patient's tolerance     Hand Dominance     Extremity/Trunk Assessment Upper Extremity Assessment Upper Extremity Assessment: Generalized weakness (pain in sternal area and across chest with BUE shoulder flex)   Lower Extremity Assessment  Lower Extremity Assessment: Defer to PT evaluation   Cervical / Trunk Assessment Cervical / Trunk Assessment: Other exceptions Cervical / Trunk Exceptions: C7 compression fx   Communication Communication Communication: No difficulties   Cognition Arousal/Alertness: Awake/alert Behavior During Therapy:  WFL for tasks assessed/performed;Impulsive (impulsive at times) Overall Cognitive Status: No family/caregiver present to determine baseline cognitive functioning       Memory: Decreased short-term memory             General Comments       Exercises       Shoulder Instructions      Home Living Family/patient expects to be discharged to:: Private residence Living Arrangements: Children Available Help at Discharge: Family;Available 24 hours/day Type of Home: House Home Access: Stairs to enter [employer] of Steps: 1   Home Layout: One level     Bathroom Shower/Tub: Tub/shower unit Shower/tub characteristics: Curtain Biochemist, clinical: Standard     Home Equipment: Environmental consultant - 2 wheels;Cane - single point;Crutches;Bedside commode;Grab bars - tub/shower   Additional Comments: tub shower      Prior Functioning/Environment Level of Independence: Independent        Comments: Pt reports she should use a cane or a walker, but they are cumbersome, so she doesn't    OT Diagnosis: Acute pain;Cognitive deficits;Generalized weakness   OT Problem List: Decreased activity tolerance;Impaired balance (sitting and/or standing);Decreased cognition;Decreased safety awareness;Decreased knowledge of use of DME or AE;Decreased knowledge of precautions;Pain   OT Treatment/Interventions: Self-care/ADL training;DME and/or AE instruction;Therapeutic activities;Balance training;Patient/family education;Cognitive remediation/compensation    OT Goals(Current goals can be found in the care plan section) Acute Rehab OT Goals Patient Stated Goal: to stop falling all the time OT Goal Formulation: With patient Time For Goal Achievement: 05/30/15 Potential to Achieve Goals: Good ADL Goals Pt Will Perform Grooming: with modified independence;standing Pt Will Perform Lower Body Bathing: with modified independence;sit to/from stand Pt Will Perform Lower Body Dressing: with modified  independence;sit to/from stand Pt Will Transfer to Toilet: with modified independence;ambulating (comfort height) Pt Will Perform Tub/Shower Transfer: Tub transfer;with modified independence;ambulating;grab bars;3 in 1  OT Frequency: Min 2X/week   Barriers to D/C:            Co-evaluation              End of Session Equipment Utilized During Treatment: Gait belt Nurse Communication: Other (comment) (sternal area pain with BUE shoulder flexion)  Activity Tolerance: Patient tolerated treatment well Patient left: in chair;with call bell/phone within reach;with chair alarm set   Time: 0454-0981 OT Time Calculation (min): 23 min Charges:  OT General Charges $OT Visit: 1 Procedure OT Evaluation $Initial OT Evaluation Tier I: 1 Procedure OT Treatments $Self Care/Home Management : 8-22 mins G-Codes:    Hortencia Pilar Jun 06, 2015, 2:05 PM

## 2015-05-23 NOTE — Progress Notes (Signed)
[facility] PROGRESS NOTE  [name] XTK:[id number] DOB: June 14, 1940 DOA: 05/22/2015 PCP: Wenda Low, MD  Length of stay: 1   Assessment/Plan: Principal Problem:   Syncope Active Problems:   Dizziness and giddiness   Chest pain   HLD (hyperlipidemia)   HTN (hypertension)   GERD (gastroesophageal reflux disease)    Syncope-likely secondary to polypharmacy, also history of vertigo on meclizine at home Patient takes Cymbalta, trazodone, Xanax, buprenorphine [started 3 weeks ago], gabapentin, meclizine, Zanaflex and Tylenol No. 4 at home Negative orthostatics 2-D echo, carotid Doppler, check cortisol level PT evaluation for vestibular symptoms/OT evaluation    Chest pain-continue telemetry, negative   cardiac enzymes, 2-D echo to rule out wall motion abnormalities, carotid Doppler    HLD (hyperlipidemia)-continue lovastatin   HTN (hypertension)-hold antihypertensive medications in the setting of syncopal episodes, hold Norvasc   GERD (gastroesophageal reflux disease)-continue Nexium  Hypokalemia-replete   DVT prophylaxsis Lovenox  Code Status:      Code Status Orders        Start     Ordered   05/22/15 1902  Full code   Continuous     05/22/15 1903      Family Communication: Discussed in detail with the patient, all imaging results, lab results explained to the patient   Disposition Plan:  Anticipate discharge tomorrow    Brief narrative: 74 year old female with history of chronic back pain, cervical spinal stenosis, followed by Dr. Delynn Flavin, management of her pain, secondary to cervical spinal stenosis with left C4-C5 radiculopathy and cervical spondylosis with facet arthropathy, on multiple pain medications, antidepressant medications, muscle relaxants and antianxiety medications, recently found to have been prescribed Tylenol No. 4 and Butrans-patch, 10 g every weekly, who presents with multiple episodes of syncope, over the last 5  days. Patient in her own words states in am always sleepy. Her syncopal episode was witnessed by EMS as well as by EDP. She did strike her head but CT of the head was negative for acute trauma. CT of the spine showed compression deformity of C7. Patient is followed by [facility] neurology for her long-standing history of balance difficulties and falls. She continues to have memory loss, headache, neck pain, tremors with multiple ER visits. She sustained new compression fractures in July 2016 and was seen by Dr. Saintclair Halsted, conservative management recommended. 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 Last 2-D echo 10/08/14 showed EF of 99-24% grade 1 diastolic dysfunction, no regional wall motion abnormalities  Consultants:  None  Procedures:  None  Antibiotics: Anti-infectives    None         HPI/Subjective: Appears to be somnolent, normal sinus rhythm overnight,  Objective: Filed Vitals:   05/22/15 2015 05/22/15 2030 05/22/15 2105 05/23/15 0508  BP: 139/89 156/96 148/72 115/61  Pulse: 88 82 77 61  Temp:   97.9 F (36.6 C) 98.3 F (36.8 C)  TempSrc:   Oral Oral  Resp: '13 23 18 18  '$ Height:   '5\' 5"'$  (1.651 m)   Weight:   79.6 kg (175 lb 7.8 oz)   SpO2: 93% 95% 97% 95%    Intake/Output Summary (Last 24 hours) at 05/23/15 0719 Last data filed at 05/22/15 2111  Gross per 24 hour  Intake      0 ml  Output      0 ml  Net      0 ml  Exam:  General: No acute respiratory distress Lungs: Clear to auscultation bilaterally without wheezes or crackles Cardiovascular: Regular rate and rhythm without murmur gallop or rub normal S1 and S2 Abdomen: Nontender, nondistended, soft, bowel sounds positive, no rebound, no ascites, no appreciable mass Extremities: No significant cyanosis, clubbing, or edema bilateral lower extremities     Data Review   Micro Results No results found for  this or any previous visit (from the past 240 hour(s)).  Radiology Reports X-ray Chest Pa And Lateral  05/22/2015  CLINICAL DATA:  Chronic back pain EXAM: CHEST  2 VIEW COMPARISON:  05/22/2015 FINDINGS: There is mild cardiac enlargement. No pleural effusion or edema identified. Calcified granulomas identified within the left lung. Chronic interstitial coarsening and scarring is noted bilaterally. No superimposed airspace consolidation. IMPRESSION: 1. Chronic lung disease. 2. No acute findings. Electronically Signed   By: Kerby Moors M.D.   On: 05/22/2015 20:30   Dg Thoracic Spine 2 View  05/22/2015  CLINICAL DATA:  74 year old female with acute on chronic back pain after falling 5 days previously. Pain localizes to the thoracic spine. EXAM: THORACIC SPINE 2 VIEWS COMPARISON:  Prior CT scan of the thoracic spine 02/15/2015 FINDINGS: There is no evidence of thoracic spine fracture. Alignment is normal. No other significant bone abnormalities are identified. Atherosclerotic vascular calcifications present in both renal arteries proximally. IMPRESSION: Negative. Electronically Signed   By: Jacqulynn Cadet M.D.   On: 05/22/2015 16:41   Ct Head Wo Contrast  05/22/2015  CLINICAL DATA:  Syncopal episode today. Fall. Headache and neck pain. EXAM: CT HEAD WITHOUT CONTRAST CT CERVICAL SPINE WITHOUT CONTRAST TECHNIQUE: Multidetector CT imaging of the head and cervical spine was performed following the standard protocol without intravenous contrast. Multiplanar CT image reconstructions of the cervical spine were also generated. COMPARISON:  Head and neck CT 02/20/2015 FINDINGS: CT HEAD FINDINGS No intracranial hemorrhage. No parenchymal contusion. No midline shift or mass effect. Basilar cisterns are patent. No skull base fracture. No fluid in the paranasal sinuses or mastoid air cells. Orbits are normal. There is generalized cortical atrophy. Mild periventricular white matter hypodensities. CT CERVICAL SPINE  FINDINGS No prevertebral soft tissue swelling. Normal alignment of cervical vertebral bodies. No loss of vertebral body height. Normal facet articulation. Normal craniocervical junction. There is compression deformity of the C7 vertebral body anteriorly with approximately 25% vertebral body height loss. This is not changed from comparison exam. No evidence epidural or paraspinal hematoma. IMPRESSION: 1. No intracranial trauma. 2. Atrophy and microvascular disease unchanged. 3. No cervical spine fracture. 4. Chronic compression deformity of C7 is stable. Electronically Signed   By: Suzy Bouchard M.D.   On: 05/22/2015 17:02   Ct Cervical Spine Wo Contrast  05/22/2015  CLINICAL DATA:  Syncopal episode today. Fall. Headache and neck pain. EXAM: CT HEAD WITHOUT CONTRAST CT CERVICAL SPINE WITHOUT CONTRAST TECHNIQUE: Multidetector CT imaging of the head and cervical spine was performed following the standard protocol without intravenous contrast. Multiplanar CT image reconstructions of the cervical spine were also generated. COMPARISON:  Head and neck CT 02/20/2015 FINDINGS: CT HEAD FINDINGS No intracranial hemorrhage. No parenchymal contusion. No midline shift or mass effect. Basilar cisterns are patent. No skull base fracture. No fluid in the paranasal sinuses or mastoid air cells. Orbits are normal. There is generalized cortical atrophy. Mild periventricular white matter hypodensities. CT CERVICAL SPINE FINDINGS No prevertebral soft tissue swelling. Normal alignment of cervical vertebral bodies. No loss of vertebral body height. Normal facet articulation. Normal craniocervical  junction. There is compression deformity of the C7 vertebral body anteriorly with approximately 25% vertebral body height loss. This is not changed from comparison exam. No evidence epidural or paraspinal hematoma. IMPRESSION: 1. No intracranial trauma. 2. Atrophy and microvascular disease unchanged. 3. No cervical spine fracture. 4.  Chronic compression deformity of C7 is stable. Electronically Signed   By: Suzy Bouchard M.D.   On: 05/22/2015 17:02   Dg Shoulder Left  04/24/2015  CLINICAL DATA:  Chronic left shoulder pain without known injury EXAM: LEFT SHOULDER - 2+ VIEW COMPARISON:  Chest x-ray which included portions of the left shoulder of Oct 25, 2014. FINDINGS: The bones of the left shoulder are adequately mineralized. The glenohumeral joint is unremarkable. The [facility] joint exhibits mild calcific change in the surrounding soft tissues. The joint space itself appears reasonably well-maintained. The subacromial subdeltoid space is preserved. The observed portions of the upper left ribs are normal. IMPRESSION: There is no acute bony abnormality of the left shoulder. Mild degenerative change of the [facility] joint is suspected. Given the chronic persistent symptoms, MRI may be a useful next imaging step. Electronically Signed   By: David  Martinique M.D.   On: 04/24/2015 15:02     CBC  Recent Labs Lab 05/22/15 1604 05/22/15 1934 05/23/15 0015  WBC 5.4 5.5 6.2  HGB 14.3 13.7 13.7  HCT 44.4 42.9 42.4  PLT 193 210 210  MCV 91.9 91.9 91.8  MCH 29.6 29.3 29.7  MCHC 32.2 31.9 32.3  RDW 13.3 13.4 13.4  LYMPHSABS 2.4  --   --   MONOABS 0.4  --   --   EOSABS 0.2  --   --   BASOSABS 0.0  --   --     Chemistries   Recent Labs Lab 05/22/15 1604 05/22/15 1934 05/23/15 0015  NA 141  --  143  K 3.2*  --  3.4*  CL 104  --  105  CO2 27  --  29  GLUCOSE 95  --  91  BUN 8  --  7  CREATININE 1.11* 1.15* 1.15*  CALCIUM 9.6  --  9.5  MG  --  2.2  --   AST  --  32 56*  ALT  --  24 29  ALKPHOS  --  106 113  BILITOT  --  0.5 0.4   ------------------------------------------------------------------------------------------------------------------ estimated creatinine clearance is 45.4 mL/min (by C-G formula based on Cr of  1.15). ------------------------------------------------------------------------------------------------------------------  Recent Labs  05/22/15 1935  HGBA1C 5.6   ------------------------------------------------------------------------------------------------------------------ No results for input(s): CHOL, HDL, LDLCALC, TRIG, CHOLHDL, LDLDIRECT in the last 72 hours. ------------------------------------------------------------------------------------------------------------------  Recent Labs  05/22/15 1934  TSH 1.273   ------------------------------------------------------------------------------------------------------------------ No results for input(s): VITAMINB12, FOLATE, FERRITIN, TIBC, IRON, RETICCTPCT in the last 72 hours.  Coagulation profile No results for input(s): INR, PROTIME in the last 168 hours.  No results for input(s): DDIMER in the last 72 hours.  Cardiac Enzymes  Recent Labs Lab 05/22/15 1934 05/23/15 0015  TROPONINI <0.03 <0.03   ------------------------------------------------------------------------------------------------------------------ Invalid input(s): POCBNP   CBG: No results for input(s): GLUCAP in the last 168 hours.     Studies: X-ray Chest Pa And Lateral  05/22/2015  CLINICAL DATA:  Chronic back pain EXAM: CHEST  2 VIEW COMPARISON:  05/22/2015 FINDINGS: There is mild cardiac enlargement. No pleural effusion or edema identified. Calcified granulomas identified within the left lung. Chronic interstitial coarsening and scarring is noted bilaterally. No superimposed airspace consolidation. IMPRESSION: 1. Chronic lung disease. 2.  No acute findings. Electronically Signed   By: Kerby Moors M.D.   On: 05/22/2015 20:30   Dg Thoracic Spine 2 View  05/22/2015  CLINICAL DATA:  74 year old female with acute on chronic back pain after falling 5 days previously. Pain localizes to the thoracic spine. EXAM: THORACIC SPINE 2 VIEWS COMPARISON:   Prior CT scan of the thoracic spine 02/15/2015 FINDINGS: There is no evidence of thoracic spine fracture. Alignment is normal. No other significant bone abnormalities are identified. Atherosclerotic vascular calcifications present in both renal arteries proximally. IMPRESSION: Negative. Electronically Signed   By: Jacqulynn Cadet M.D.   On: 05/22/2015 16:41   Ct Head Wo Contrast  05/22/2015  CLINICAL DATA:  Syncopal episode today. Fall. Headache and neck pain. EXAM: CT HEAD WITHOUT CONTRAST CT CERVICAL SPINE WITHOUT CONTRAST TECHNIQUE: Multidetector CT imaging of the head and cervical spine was performed following the standard protocol without intravenous contrast. Multiplanar CT image reconstructions of the cervical spine were also generated. COMPARISON:  Head and neck CT 02/20/2015 FINDINGS: CT HEAD FINDINGS No intracranial hemorrhage. No parenchymal contusion. No midline shift or mass effect. Basilar cisterns are patent. No skull base fracture. No fluid in the paranasal sinuses or mastoid air cells. Orbits are normal. There is generalized cortical atrophy. Mild periventricular white matter hypodensities. CT CERVICAL SPINE FINDINGS No prevertebral soft tissue swelling. Normal alignment of cervical vertebral bodies. No loss of vertebral body height. Normal facet articulation. Normal craniocervical junction. There is compression deformity of the C7 vertebral body anteriorly with approximately 25% vertebral body height loss. This is not changed from comparison exam. No evidence epidural or paraspinal hematoma. IMPRESSION: 1. No intracranial trauma. 2. Atrophy and microvascular disease unchanged. 3. No cervical spine fracture. 4. Chronic compression deformity of C7 is stable. Electronically Signed   By: Suzy Bouchard M.D.   On: 05/22/2015 17:02   Ct Cervical Spine Wo Contrast  05/22/2015  CLINICAL DATA:  Syncopal episode today. Fall. Headache and neck pain. EXAM: CT HEAD WITHOUT CONTRAST CT CERVICAL SPINE  WITHOUT CONTRAST TECHNIQUE: Multidetector CT imaging of the head and cervical spine was performed following the standard protocol without intravenous contrast. Multiplanar CT image reconstructions of the cervical spine were also generated. COMPARISON:  Head and neck CT 02/20/2015 FINDINGS: CT HEAD FINDINGS No intracranial hemorrhage. No parenchymal contusion. No midline shift or mass effect. Basilar cisterns are patent. No skull base fracture. No fluid in the paranasal sinuses or mastoid air cells. Orbits are normal. There is generalized cortical atrophy. Mild periventricular white matter hypodensities. CT CERVICAL SPINE FINDINGS No prevertebral soft tissue swelling. Normal alignment of cervical vertebral bodies. No loss of vertebral body height. Normal facet articulation. Normal craniocervical junction. There is compression deformity of the C7 vertebral body anteriorly with approximately 25% vertebral body height loss. This is not changed from comparison exam. No evidence epidural or paraspinal hematoma. IMPRESSION: 1. No intracranial trauma. 2. Atrophy and microvascular disease unchanged. 3. No cervical spine fracture. 4. Chronic compression deformity of C7 is stable. Electronically Signed   By: Suzy Bouchard M.D.   On: 05/22/2015 17:02      Lab Results  Component Value Date   HGBA1C 5.6 05/22/2015   HGBA1C 5.6 10/05/2014   Lab Results  Component Value Date   CREATININE 1.15* 05/23/2015       Scheduled Meds: . ALPRAZolam  1 mg Oral QHS  . buprenorphine  10 mcg Transdermal Weekly  . enoxaparin (LOVENOX) injection  40 mg Subcutaneous Q24H  . gabapentin  400 mg Oral TID  . pantoprazole  40 mg Oral Daily  . polyethylene glycol  17 g Oral Daily  . potassium chloride  40 mEq Oral Once  . senna  1-2 tablet Oral Daily  . sodium chloride  3 mL Intravenous Q12H  . tiZANidine  2 mg Oral TID   Continuous Infusions: . 0.9 % NaCl with KCl 40 mEq / L 100 mL/hr (05/23/15 9242)    Principal  Problem:   Syncope Active Problems:   Dizziness and giddiness   Chest pain   HLD (hyperlipidemia)   HTN (hypertension)   GERD (gastroesophageal reflux disease)    Time spent: 45 minutes   [facility] Pager [phone]. If 7PM-7AM, please contact night-coverage at [email or web address], password [facility] 05/23/2015, 7:19 AM  LOS: 1 day

## 2015-05-23 NOTE — Evaluation (Signed)
Physical Therapy Evaluation Patient Details Name: [name] MRN: [id number] DOB: Feb 25, 1941 Today's Date: 05/23/2015   History of Present Illness  74 y.o. female admitted to [facility] on 05/22/15 for multiple episodes of syncope over the last 5 days.  CT of head was negative for acute events, but neck showed C7 compression deformity. Workup continued to try to find source of syncope.  Pt with significant PMHx of anemia, stroke, osteoporosis, DDD, COPD, HTN, macular degeneration, blind in L eye, and L TKA.  Clinical Impression  Pt is unsteady on her feet, but none of the vestibular tests were symptomatic or positive.  I do not believe the source of her falls are a vestibular origin.  She also has good sensation in her feet, but poor vision (blind in L eye).  She reports there have been no patterns to her falling (day/night, inside/out), and she reports she can't tell when she is going to fall, but her family reports she starts to shake/tremor and then falls.   PT to follow acutely for deficits listed below.       Follow Up Recommendations Home health PT;Supervision for mobility/OOB    Equipment Recommendations  None recommended by PT    Recommendations for Other Services   NA    Precautions / Restrictions Precautions Precautions: Fall Precaution Comments: Pt with h/o 8 falls in the past month.       Mobility  Bed Mobility Overal bed mobility: Modified Independent             General bed mobility comments: uses bed rail for leverage  Transfers Overall transfer level: Needs assistance Equipment used: None Transfers: Sit to/from Stand Sit to Stand: Supervision         General transfer comment: supervision for safety, verbal cues for safe hand placement and slow descent.   Ambulation/Gait Ambulation/Gait assistance: Min guard Ambulation Distance (Feet): 200 Feet Assistive device: None Gait Pattern/deviations: Step-through pattern;Staggering left;Staggering right Gait  velocity: decreased Gait velocity interpretation: Below normal speed for age/gender General Gait Details: Pt with mildly staggering gait pattern requiring min guard assist for safety and multiple LOB during gait.  Pt compensates well for L eye blindness and does not seem to run into obstacles on her left during gait.       Balance Overall balance assessment: Needs assistance Sitting-balance support: Feet supported;No upper extremity supported Sitting balance-Leahy Scale: Good     Standing balance support: No upper extremity supported Standing balance-Leahy Scale: Good Standing balance comment: static balance good, dynamic requires min guard or close supervision                             Pertinent Vitals/Pain Pain Assessment: Faces Faces Pain Scale: Hurts little more Pain Location: neck, head, HA Pain Descriptors / Indicators: Aching Pain Intervention(s): Limited activity within patient's tolerance;Monitored during session;Premedicated before session;Repositioned    Home Living Family/patient expects to be discharged to:: Private residence Living Arrangements: Children Available Help at Discharge: Family;Available 24 hours/day Type of Home: House Home Access: Stairs to enter   [employer] of Steps: 1 Home Layout: One level Home Equipment: Walker - 2 wheels;Cane - single point;Crutches;Bedside commode;Grab bars - tub/shower Additional Comments: tub shower    Prior Function Level of Independence: Independent         Comments: Pt reports she should use a cane or a walker, but they are cumbersome, so she doesn't  Extremity/Trunk Assessment   Upper Extremity Assessment: Defer to OT evaluation           Lower Extremity Assessment: Generalized weakness      Cervical / Trunk Assessment: Other exceptions  Communication   Communication: No difficulties  Cognition Arousal/Alertness: Awake/alert Behavior During Therapy: WFL for tasks  assessed/performed Overall Cognitive Status: No family/caregiver present to determine baseline cognitive functioning                      General Comments General comments (skin integrity, edema, etc.): Vestibular assessment completed with no obvious source of vestibular pathology.  Pt reports h/o vertigo and takes Meclazine at home PRN.      Vestibular Assessment     05/23/15 1326  Vestibular Assessment  General Observation Pt reports blind in L eye, has been for year, heriditary.  Has had recent head trauma with last fall, feels tremoring and "passes out" when she falls per her family report, she never really remembers.  HA now, but she hit her head, no h/o migraines.  Symptom Behavior  Type of Dizziness Lightheadedness  Frequency of Dizziness 8 falls in the past month, but unable to really report  Duration of Dizziness unable to really report  Aggravating Factors No known aggravating factors  Relieving Factors No known relieving factors  Occulomotor Exam  Occulomotor Alignment Normal  Spontaneous Absent  Gaze-induced Absent  Smooth Pursuits Intact  Saccades Intact  Vestibulo-Occular Reflex  VOR 1 Head Only (x 1 viewing) normal both vertical and horizontal, not symptom provoking.   Orthostatics  BP supine (x 5 minutes) (orthostatics taken this AM and were (-))        Assessment/Plan    PT Assessment Patient needs continued PT services  PT Diagnosis Abnormality of gait;Difficulty walking;Generalized weakness   PT Problem List Decreased strength;Decreased activity tolerance;Decreased balance;Decreased mobility;Decreased knowledge of use of DME;Pain  PT Treatment Interventions DME instruction;Gait training;Stair training;Functional mobility training;Therapeutic activities;Therapeutic exercise;Balance training;Neuromuscular re-education;Patient/family education   PT Goals (Current goals can be found in the Care Plan section) Acute Rehab PT Goals Patient Stated Goal: to  stop falling all the time PT Goal Formulation: With patient Time For Goal Achievement: 06/06/15 Potential to Achieve Goals: Good    Frequency Min 3X/week           End of Session   Activity Tolerance: Patient tolerated treatment well Patient left: in chair;with call bell/phone within reach;with chair alarm set Nurse Communication: Mobility status         Time: 1210-1237 PT Time Calculation (min) (ACUTE ONLY): 27 min   Charges:   PT Evaluation $Initial PT Evaluation Tier I: 1 Procedure PT Treatments $Gait Training: 8-22 mins    Jariyah Hackley B. Munachimso Rigdon, PT, DPT [phone]   05/23/2015, 1:25 PM

## 2015-05-23 NOTE — Progress Notes (Signed)
Buprenorphine 10 mcg patch d/c and removed at 1100.  [name]

## 2015-05-23 NOTE — Progress Notes (Signed)
  Echocardiogram 2D Echocardiogram has been performed.  [name] 05/23/2015, 10:01 AM

## 2015-05-24 DIAGNOSIS — R55 Syncope and collapse: Secondary | ICD-10-CM | POA: Diagnosis not present

## 2015-05-24 DIAGNOSIS — I10 Essential (primary) hypertension: Secondary | ICD-10-CM | POA: Diagnosis not present

## 2015-05-24 DIAGNOSIS — G894 Chronic pain syndrome: Secondary | ICD-10-CM | POA: Diagnosis not present

## 2015-05-24 DIAGNOSIS — K219 Gastro-esophageal reflux disease without esophagitis: Secondary | ICD-10-CM | POA: Diagnosis not present

## 2015-05-24 DIAGNOSIS — G8929 Other chronic pain: Secondary | ICD-10-CM | POA: Diagnosis not present

## 2015-05-24 DIAGNOSIS — E785 Hyperlipidemia, unspecified: Secondary | ICD-10-CM | POA: Diagnosis not present

## 2015-05-24 DIAGNOSIS — J449 Chronic obstructive pulmonary disease, unspecified: Secondary | ICD-10-CM | POA: Diagnosis not present

## 2015-05-24 DIAGNOSIS — T50995A Adverse effect of other drugs, medicaments and biological substances, initial encounter: Secondary | ICD-10-CM | POA: Diagnosis not present

## 2015-05-24 LAB — URINALYSIS, ROUTINE W REFLEX MICROSCOPIC
Bilirubin Urine: NEGATIVE
Glucose, UA: NEGATIVE mg/dL
Hgb urine dipstick: NEGATIVE
Ketones, ur: NEGATIVE mg/dL
Leukocytes, UA: NEGATIVE
Nitrite: NEGATIVE
Protein, ur: NEGATIVE mg/dL
Specific Gravity, Urine: 1.013 (ref 1.005–1.030)
pH: 6.5 (ref 5.0–8.0)

## 2015-05-24 LAB — COMPREHENSIVE METABOLIC PANEL
ALT: 25 U/L (ref 14–54)
AST: 31 U/L (ref 15–41)
Albumin: 2.9 g/dL — ABNORMAL LOW (ref 3.5–5.0)
Alkaline Phosphatase: 90 U/L (ref 38–126)
Anion gap: 7 (ref 5–15)
BUN: 9 mg/dL (ref 6–20)
CO2: 24 mmol/L (ref 22–32)
Calcium: 9.3 mg/dL (ref 8.9–10.3)
Chloride: 112 mmol/L — ABNORMAL HIGH (ref 101–111)
Creatinine, Ser: 1.16 mg/dL — ABNORMAL HIGH (ref 0.44–1.00)
GFR calc Af Amer: 53 mL/min — ABNORMAL LOW (ref >60)
GFR calc non Af Amer: 46 mL/min — ABNORMAL LOW (ref >60)
Glucose, Bld: 101 mg/dL — ABNORMAL HIGH (ref 65–99)
Potassium: 4.9 mmol/L (ref 3.5–5.1)
Sodium: 143 mmol/L (ref 135–145)
Total Bilirubin: 0.2 mg/dL — ABNORMAL LOW (ref 0.3–1.2)
Total Protein: 6 g/dL — ABNORMAL LOW (ref 6.5–8.1)

## 2015-05-24 MED ORDER — ALPRAZOLAM 1 MG PO TABS: 1.0000 mg | ORAL_TABLET | Freq: Every evening | ORAL | Status: DC | PRN

## 2015-05-24 MED ORDER — BUPRENORPHINE 10 MCG/HR TD PTWK: 10.0000 ug | MEDICATED_PATCH | TRANSDERMAL | Status: DC

## 2015-05-24 MED ORDER — TIZANIDINE HCL 2 MG PO TABS: 1.0000 mg | ORAL_TABLET | Freq: Two times a day (BID) | ORAL | Status: DC | PRN

## 2015-05-24 MED ORDER — HYDROCODONE-ACETAMINOPHEN 5-325 MG PO TABS: 1.0000 | ORAL_TABLET | Freq: Four times a day (QID) | ORAL | Status: DC | PRN

## 2015-05-24 MED ORDER — ACETAMINOPHEN 325 MG PO TABS: 650.0000 mg | ORAL_TABLET | Freq: Four times a day (QID) | ORAL | Status: DC | PRN

## 2015-05-24 NOTE — Discharge Summary (Signed)
Physician Discharge Summary  [name] XNA:[id number] DOB: 05-Apr-1941 DOA: 05/22/2015  PCP: Wenda Low, MD  Admit date: 05/22/2015 Discharge date: 05/24/2015  Time spent: 35 minutes  Recommendations for Outpatient Follow-up:  1. 24 hour supervision 2. Home health 3. Wean off   Discharge Diagnoses:  Principal Problem:   Syncope Active Problems:   Dizziness and giddiness   Chest pain   HLD (hyperlipidemia)   HTN (hypertension)   GERD (gastroesophageal reflux disease)   Discharge Condition: improved  Diet recommendation: cardiac  Filed Weights   05/22/15 2105  Weight: 79.6 kg (175 lb 7.8 oz)    History of present illness:  74 year old female with history of chronic back pain, cervical spinal stenosis, followed by Dr. Delynn Flavin, management of her pain, secondary to cervical spinal stenosis with left C4-C5 radiculopathy and cervical spondylosis with facet arthropathy, on multiple pain medications, antidepressant medications, muscle relaxants and antianxiety medications, recently found to have been prescribed Tylenol No. 4 and Butrans-patch, 10 g every weekly, who presents with multiple episodes of syncope, over the last 5 days. Patient in her own words states in am always sleepy. Her syncopal episode was witnessed by EMS as well as by EDP. She did strike her head but CT of the head was negative for acute trauma. CT of the spine showed compression deformity of C7. Patient is followed by [facility] neurology for her long-standing history of balance difficulties and falls. She continues to have memory loss, headache, neck pain, tremors with multiple ER visits. She sustained new compression fractures in July 2016 and was seen by Dr. Saintclair Halsted, conservative management recommended. 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 Last 2-D echo 10/08/14 showed EF of 54-27% grade 1 diastolic  dysfunction, no regional wall motion abnormalities  Hospital Course:  Syncope-likely secondary to polypharmacy, also history of vertigo on meclizine at home Patient takes Cymbalta, trazodone, Xanax, buprenorphine [started 3 weeks ago], gabapentin, meclizine, Zanaflex and Tylenol No. 4 at home-- d/c'd tylenol 4, wean down trazadone, wean down zanaflex- spoke at length with daughter- also spoke with Dr. Ella Bodo who manages patient's pain Negative orthostatics 2-D echo ok cortisol level ok PT evaluation for vestibular symptoms/OT evaluation- 24 hour supervision   Chest pain-continue telemetry, negative cardiac enzymes, 2-D echo to rule out wall motion abnormalities   HLD (hyperlipidemia)-continue lovastatin   HTN (hypertension)- -resume home meds   GERD (gastroesophageal reflux disease)-continue Nexium  Hypokalemia-replete    Procedures:  Consultations:    Discharge Exam: Filed Vitals:   05/23/15 2054 05/24/15 0552  BP: 121/70 145/78  Pulse: 57 68  Temp: 98.7 F (37.1 C) 98 F (36.7 C)  Resp: 18 18    General: awake, NAD- does not like the food here Cardiovascular: rrr Respiratory: clear  Discharge Instructions   Discharge Instructions    Diet - low sodium heart healthy    Complete by:  As directed      Discharge instructions    Complete by:  As directed   24 hour supervision by family Home health PT/OT Would try tylenol before giving vicodin vicodin for the next 24-36 hours while patch gets into system Keep appointment for injection Keep log of symptoms and when medications given     Increase activity slowly    Complete by:  As directed           Current Discharge Medication List    START taking these medications   Details  acetaminophen (TYLENOL) 325 MG tablet Take 2 tablets (650 mg total) by mouth every 6 (six) hours as needed for mild pain (or Fever >/= 101).    HYDROcodone-acetaminophen (NORCO/VICODIN) 5-325 MG tablet Take 1 tablet by  mouth every 6 (six) hours as needed for moderate pain. Qty: 7 tablet, Refills: 0      CONTINUE these medications which have CHANGED   Details  ALPRAZolam (XANAX) 1 MG tablet Take 1 tablet (1 mg total) by mouth at bedtime as needed for anxiety. Qty: 30 tablet, Refills: 0    tiZANidine (ZANAFLEX) 2 MG tablet Take 0.5 tablets (1 mg total) by mouth 2 (two) times daily as needed for muscle spasms. Qty: 30 tablet, Refills: 0      CONTINUE these medications which have NOT CHANGED   Details  albuterol (ACCUNEB) 0.63 MG/3ML nebulizer solution Take 1 ampule by nebulization every 6 (six) hours as needed for wheezing.    albuterol (PROVENTIL HFA;VENTOLIN HFA) 108 (90 BASE) MCG/ACT inhaler Inhale 1 puff into the lungs every 6 (six) hours as needed for wheezing or shortness of breath.    albuterol (PROVENTIL) (2.5 MG/3ML) 0.083% nebulizer solution Take 2.5 mg by nebulization every 6 (six) hours as needed for wheezing or shortness of breath.    amLODipine (NORVASC) 5 MG tablet Take 5 mg by mouth daily.     buprenorphine (BUTRANS) 10 MCG/HR PTWK patch Place 1 patch (10 mcg total) onto the skin once a week. Qty: 4 patch, Refills: 1    dicyclomine (BENTYL) 20 MG tablet Take 20 mg by mouth daily as needed for spasms.     DOCUSATE SODIUM PO Take 1 capsule by mouth daily.    DULoxetine (CYMBALTA) 30 MG capsule Take 30 mg by mouth daily.    esomeprazole (NEXIUM) 40 MG capsule Take 40 mg by mouth daily.     IRON PO Take 1 tablet by mouth every other day.     KRILL OIL PO Take 1 tablet by mouth daily.    lovastatin (MEVACOR) 10 MG tablet Take 10 mg by mouth daily.    meclizine (ANTIVERT) 12.5 MG tablet Take 12.5 mg by mouth 3 (three) times daily as needed for nausea.    polyethylene glycol powder (GLYCOLAX/MIRALAX) powder Take 17 g by mouth daily. For constipation    senna (SENOKOT) 8.6 MG tablet Take 1-2 tablets by mouth daily.     traZODone (DESYREL) 50 MG tablet Take 0.5 tablets (25 mg  total) by mouth at bedtime. Qty: 30 tablet, Refills: 6    vitamin B-12 (CYANOCOBALAMIN) 1000 MCG tablet Take 1,000 mcg by mouth daily.    gabapentin (NEURONTIN) 400 MG capsule TAKE 1 CAPSULE BY MOUTH THREE TIMES A DAY Qty: 90 capsule, Refills: 0      STOP taking these medications     tizanidine (ZANAFLEX) 2 MG capsule      acetaminophen-codeine (TYLENOL #4) 300-60 MG tablet        Allergies  Allergen Reactions  . Ampicillin Nausea Only and Other (See Comments)    Thrush.   . Oxycodone-Acetaminophen Itching    Can take with benadryl  . Toradol [Ketorolac Tromethamine] Nausea Only and Other (See Comments)    Headaches, back aches  . Ambien [Zolpidem Tartrate] Other (See Comments)    unknown  . Ampicillin-Sulbactam Sodium Other (See Comments)    Muscles draw up tight - severe headache  . Tape Other (See Comments)    Paper tape only.  . Cephalexin Other (See Comments)  unknown  . Percocet [Oxycodone-Acetaminophen] Itching and Rash    Vicodin is OK  . Sulfamethoxazole-Trimethoprim Other (See Comments)    unknown  . Tramadol Rash and Other (See Comments)    Headache.    Follow-up Information    Follow up with Wenda Low, MD In 1 week.   Specialty:  Internal Medicine   Contact information:   [address] [place] [address] [phone]        The results of significant diagnostics from this hospitalization (including imaging, microbiology, ancillary and laboratory) are listed below for reference.    Significant Diagnostic Studies: X-ray Chest Pa And Lateral  05/22/2015  CLINICAL DATA:  Chronic back pain EXAM: CHEST  2 VIEW COMPARISON:  05/22/2015 FINDINGS: There is mild cardiac enlargement. No pleural effusion or edema identified. Calcified granulomas identified within the left lung. Chronic interstitial coarsening and scarring is noted bilaterally. No superimposed airspace consolidation. IMPRESSION: 1. Chronic lung disease. 2. No acute  findings. Electronically Signed   By: Kerby Moors M.D.   On: 05/22/2015 20:30   Dg Thoracic Spine 2 View  05/22/2015  CLINICAL DATA:  74 year old female with acute on chronic back pain after falling 5 days previously. Pain localizes to the thoracic spine. EXAM: THORACIC SPINE 2 VIEWS COMPARISON:  Prior CT scan of the thoracic spine 02/15/2015 FINDINGS: There is no evidence of thoracic spine fracture. Alignment is normal. No other significant bone abnormalities are identified. Atherosclerotic vascular calcifications present in both renal arteries proximally. IMPRESSION: Negative. Electronically Signed   By: Jacqulynn Cadet M.D.   On: 05/22/2015 16:41   Ct Head Wo Contrast  05/22/2015  CLINICAL DATA:  Syncopal episode today. Fall. Headache and neck pain. EXAM: CT HEAD WITHOUT CONTRAST CT CERVICAL SPINE WITHOUT CONTRAST TECHNIQUE: Multidetector CT imaging of the head and cervical spine was performed following the standard protocol without intravenous contrast. Multiplanar CT image reconstructions of the cervical spine were also generated. COMPARISON:  Head and neck CT 02/20/2015 FINDINGS: CT HEAD FINDINGS No intracranial hemorrhage. No parenchymal contusion. No midline shift or mass effect. Basilar cisterns are patent. No skull base fracture. No fluid in the paranasal sinuses or mastoid air cells. Orbits are normal. There is generalized cortical atrophy. Mild periventricular white matter hypodensities. CT CERVICAL SPINE FINDINGS No prevertebral soft tissue swelling. Normal alignment of cervical vertebral bodies. No loss of vertebral body height. Normal facet articulation. Normal craniocervical junction. There is compression deformity of the C7 vertebral body anteriorly with approximately 25% vertebral body height loss. This is not changed from comparison exam. No evidence epidural or paraspinal hematoma. IMPRESSION: 1. No intracranial trauma. 2. Atrophy and microvascular disease unchanged. 3. No cervical  spine fracture. 4. Chronic compression deformity of C7 is stable. Electronically Signed   By: Suzy Bouchard M.D.   On: 05/22/2015 17:02   Ct Cervical Spine Wo Contrast  05/22/2015  CLINICAL DATA:  Syncopal episode today. Fall. Headache and neck pain. EXAM: CT HEAD WITHOUT CONTRAST CT CERVICAL SPINE WITHOUT CONTRAST TECHNIQUE: Multidetector CT imaging of the head and cervical spine was performed following the standard protocol without intravenous contrast. Multiplanar CT image reconstructions of the cervical spine were also generated. COMPARISON:  Head and neck CT 02/20/2015 FINDINGS: CT HEAD FINDINGS No intracranial hemorrhage. No parenchymal contusion. No midline shift or mass effect. Basilar cisterns are patent. No skull base fracture. No fluid in the paranasal sinuses or mastoid air cells. Orbits are normal. There is generalized cortical atrophy. Mild periventricular white matter hypodensities. CT  CERVICAL SPINE FINDINGS No prevertebral soft tissue swelling. Normal alignment of cervical vertebral bodies. No loss of vertebral body height. Normal facet articulation. Normal craniocervical junction. There is compression deformity of the C7 vertebral body anteriorly with approximately 25% vertebral body height loss. This is not changed from comparison exam. No evidence epidural or paraspinal hematoma. IMPRESSION: 1. No intracranial trauma. 2. Atrophy and microvascular disease unchanged. 3. No cervical spine fracture. 4. Chronic compression deformity of C7 is stable. Electronically Signed   By: Suzy Bouchard M.D.   On: 05/22/2015 17:02   Dg Shoulder Left  04/24/2015  CLINICAL DATA:  Chronic left shoulder pain without known injury EXAM: LEFT SHOULDER - 2+ VIEW COMPARISON:  Chest x-ray which included portions of the left shoulder of Oct 25, 2014. FINDINGS: The bones of the left shoulder are adequately mineralized. The glenohumeral joint is unremarkable. The [facility] joint exhibits mild calcific change in the  surrounding soft tissues. The joint space itself appears reasonably well-maintained. The subacromial subdeltoid space is preserved. The observed portions of the upper left ribs are normal. IMPRESSION: There is no acute bony abnormality of the left shoulder. Mild degenerative change of the [facility] joint is suspected. Given the chronic persistent symptoms, MRI may be a useful next imaging step. Electronically Signed   By: David  Martinique M.D.   On: 04/24/2015 15:02    Microbiology: No results found for this or any previous visit (from the past 240 hour(s)).   Labs: Basic Metabolic Panel:  Recent Labs Lab 05/22/15 1604 05/22/15 1934 05/23/15 0015 05/24/15 0354  NA 141  --  143 143  K 3.2*  --  3.4* 4.9  CL 104  --  105 112*  CO2 27  --  29 24  GLUCOSE 95  --  91 101*  BUN 8  --  7 9  CREATININE 1.11* 1.15* 1.15* 1.16*  CALCIUM 9.6  --  9.5 9.3  MG  --  2.2  --   --    Liver Function Tests:  Recent Labs Lab 05/22/15 1934 05/23/15 0015 05/24/15 0354  AST 32 56* 31  ALT '24 29 25  '$ ALKPHOS 106 113 90  BILITOT 0.5 0.4 0.2*  PROT 7.4 6.9 6.0*  ALBUMIN 3.4* 3.2* 2.9*   No results for input(s): LIPASE, AMYLASE in the last 168 hours. No results for input(s): AMMONIA in the last 168 hours. CBC:  Recent Labs Lab 05/22/15 1604 05/22/15 1934 05/23/15 0015  WBC 5.4 5.5 6.2  NEUTROABS 2.3  --   --   HGB 14.3 13.7 13.7  HCT 44.4 42.9 42.4  MCV 91.9 91.9 91.8  PLT 193 210 210   Cardiac Enzymes:  Recent Labs Lab 05/22/15 1934 05/23/15 0015 05/23/15 0858  TROPONINI <0.03 <0.03 <0.03   BNP: BNP (last 3 results) No results for input(s): BNP in the last 8760 hours.  ProBNP (last 3 results) No results for input(s): PROBNP in the last 8760 hours.  CBG: No results for input(s): GLUCAP in the last 168 hours.     Signed:  Geradine Girt DO  [facility] 05/24/2015, 1:37 PM

## 2015-05-24 NOTE — Progress Notes (Signed)
Patient given discharge instructions, medication list, follow up appointments and paper prescriptions, patient verbalized understanding, information also reviewed with daughter. Will discharge home as ordered. Eulalia Ellerman, [employer] [place]] RN

## 2015-05-25 ENCOUNTER — Telehealth: Payer: Self-pay | Admitting: Registered Nurse

## 2015-05-25 MED ORDER — HYDROCODONE-ACETAMINOPHEN 7.5-325 MG PO TABS: 1.0000 | ORAL_TABLET | Freq: Two times a day (BID) | ORAL | Status: DC | PRN

## 2015-05-25 NOTE — Telephone Encounter (Signed)
Ms. [name] called this provider last night 05/24/2015 stating her mother was discharged from [facility] on 05/24/2015. Ms. Rollen Sox was prescribed Butran patch on 05/04/2015 by Dr. Letta Pate. The daughter states her mother has been having hallucination while on the [place] patch she didn't let anyone know. She was instructed to remove the patch last night 05/24/2015. [name] has an appointment today 05/25/2015 she returned her mother's prescription (Ms. Jaycie Kregel) the script of hydrocodone 5/325 mg #7 tablets, script destroyed. [facility] reviewed, [name] was receiving Hydrocodone 7.5 mg tablets, she has an appointment with Dr. Letta Pate on 06/05/2014. Due to transportation problem will prescribe hydrocodone 7.5/325 mg twice a day until she see's Dr. Letta Pate. Ms. [name] verbalizes understanding.

## 2015-05-25 NOTE — Telephone Encounter (Signed)
Previous noe written in my chart message.

## 2015-05-26 DIAGNOSIS — K219 Gastro-esophageal reflux disease without esophagitis: Secondary | ICD-10-CM | POA: Diagnosis not present

## 2015-05-26 DIAGNOSIS — R55 Syncope and collapse: Secondary | ICD-10-CM | POA: Diagnosis not present

## 2015-05-26 DIAGNOSIS — R42 Dizziness and giddiness: Secondary | ICD-10-CM | POA: Diagnosis not present

## 2015-05-26 DIAGNOSIS — M4722 Other spondylosis with radiculopathy, cervical region: Secondary | ICD-10-CM | POA: Diagnosis not present

## 2015-05-26 DIAGNOSIS — G894 Chronic pain syndrome: Secondary | ICD-10-CM | POA: Diagnosis not present

## 2015-05-26 DIAGNOSIS — Z9181 History of falling: Secondary | ICD-10-CM | POA: Diagnosis not present

## 2015-05-26 DIAGNOSIS — T481X5D Adverse effect of skeletal muscle relaxants [neuromuscular blocking agents], subsequent encounter: Secondary | ICD-10-CM | POA: Diagnosis not present

## 2015-05-26 DIAGNOSIS — I10 Essential (primary) hypertension: Secondary | ICD-10-CM | POA: Diagnosis not present

## 2015-05-26 DIAGNOSIS — T43505D Adverse effect of unspecified antipsychotics and neuroleptics, subsequent encounter: Secondary | ICD-10-CM | POA: Diagnosis not present

## 2015-05-26 DIAGNOSIS — E785 Hyperlipidemia, unspecified: Secondary | ICD-10-CM | POA: Diagnosis not present

## 2015-05-26 DIAGNOSIS — T40605D Adverse effect of unspecified narcotics, subsequent encounter: Secondary | ICD-10-CM | POA: Diagnosis not present

## 2015-05-26 DIAGNOSIS — M4802 Spinal stenosis, cervical region: Secondary | ICD-10-CM | POA: Diagnosis not present

## 2015-05-26 DIAGNOSIS — T43205D Adverse effect of unspecified antidepressants, subsequent encounter: Secondary | ICD-10-CM | POA: Diagnosis not present

## 2015-05-26 DIAGNOSIS — M549 Dorsalgia, unspecified: Secondary | ICD-10-CM | POA: Diagnosis not present

## 2015-05-29 DIAGNOSIS — M549 Dorsalgia, unspecified: Secondary | ICD-10-CM | POA: Diagnosis not present

## 2015-05-29 DIAGNOSIS — G894 Chronic pain syndrome: Secondary | ICD-10-CM | POA: Diagnosis not present

## 2015-05-29 DIAGNOSIS — R55 Syncope and collapse: Secondary | ICD-10-CM | POA: Diagnosis not present

## 2015-05-29 DIAGNOSIS — M4802 Spinal stenosis, cervical region: Secondary | ICD-10-CM | POA: Diagnosis not present

## 2015-05-29 DIAGNOSIS — R42 Dizziness and giddiness: Secondary | ICD-10-CM | POA: Diagnosis not present

## 2015-05-29 DIAGNOSIS — M4722 Other spondylosis with radiculopathy, cervical region: Secondary | ICD-10-CM | POA: Diagnosis not present

## 2015-05-30 DIAGNOSIS — G894 Chronic pain syndrome: Secondary | ICD-10-CM | POA: Diagnosis not present

## 2015-05-30 DIAGNOSIS — M4802 Spinal stenosis, cervical region: Secondary | ICD-10-CM | POA: Diagnosis not present

## 2015-05-30 DIAGNOSIS — R55 Syncope and collapse: Secondary | ICD-10-CM | POA: Diagnosis not present

## 2015-05-30 DIAGNOSIS — M4722 Other spondylosis with radiculopathy, cervical region: Secondary | ICD-10-CM | POA: Diagnosis not present

## 2015-05-30 DIAGNOSIS — R42 Dizziness and giddiness: Secondary | ICD-10-CM | POA: Diagnosis not present

## 2015-05-30 DIAGNOSIS — M549 Dorsalgia, unspecified: Secondary | ICD-10-CM | POA: Diagnosis not present

## 2015-06-02 ENCOUNTER — Telehealth: Payer: Self-pay

## 2015-06-02 DIAGNOSIS — M4722 Other spondylosis with radiculopathy, cervical region: Secondary | ICD-10-CM | POA: Diagnosis not present

## 2015-06-02 DIAGNOSIS — R55 Syncope and collapse: Secondary | ICD-10-CM | POA: Diagnosis not present

## 2015-06-02 DIAGNOSIS — R42 Dizziness and giddiness: Secondary | ICD-10-CM | POA: Diagnosis not present

## 2015-06-02 DIAGNOSIS — G894 Chronic pain syndrome: Secondary | ICD-10-CM | POA: Diagnosis not present

## 2015-06-02 DIAGNOSIS — M549 Dorsalgia, unspecified: Secondary | ICD-10-CM | POA: Diagnosis not present

## 2015-06-02 DIAGNOSIS — M4802 Spinal stenosis, cervical region: Secondary | ICD-10-CM | POA: Diagnosis not present

## 2015-06-02 NOTE — Telephone Encounter (Signed)
Return [name] call regarding her mother [name] pain. I also spoke with Dr. Letta Pate he agrees with the plan. She will remain on the hydrocodone she is scheduled for procedure on 06/05/2014 with Dr. Letta Pate. She verbalizes understanding.

## 2015-06-02 NOTE — Telephone Encounter (Signed)
Pt's daughter-Cathy-states that the Hydrocodone-ace 7.5 mg is not helping with her pain. They have also tried ice, with no relief. Please advise on what they can do? Thanks!

## 2015-06-05 DIAGNOSIS — M4722 Other spondylosis with radiculopathy, cervical region: Secondary | ICD-10-CM | POA: Diagnosis not present

## 2015-06-05 DIAGNOSIS — G894 Chronic pain syndrome: Secondary | ICD-10-CM | POA: Diagnosis not present

## 2015-06-05 DIAGNOSIS — M549 Dorsalgia, unspecified: Secondary | ICD-10-CM | POA: Diagnosis not present

## 2015-06-05 DIAGNOSIS — M4802 Spinal stenosis, cervical region: Secondary | ICD-10-CM | POA: Diagnosis not present

## 2015-06-05 DIAGNOSIS — R42 Dizziness and giddiness: Secondary | ICD-10-CM | POA: Diagnosis not present

## 2015-06-05 DIAGNOSIS — R55 Syncope and collapse: Secondary | ICD-10-CM | POA: Diagnosis not present

## 2015-06-06 ENCOUNTER — Telehealth: Payer: Self-pay | Admitting: *Deleted

## 2015-06-06 ENCOUNTER — Ambulatory Visit: Payer: Medicare Other | Admitting: Physical Medicine & Rehabilitation

## 2015-06-06 MED ORDER — ACETAMINOPHEN-CODEINE #4 300-60 MG PO TABS: 1.0000 | ORAL_TABLET | Freq: Three times a day (TID) | ORAL | Status: DC | PRN

## 2015-06-06 NOTE — Telephone Encounter (Signed)
Pt unable to make her appt today due to road conditions. Rescheduled for a procedure 06/27/2015 with Dr. Letta Pate. Patient is going to run out of her pain meds tomorrow.  wondering what can be done to fix the situation? Patient was hospitalized and her Butrans patch was discontinued due to syncope.  A bridge script of Norco 7.5-325 mg was provided to the pt by Danella Sensing, NP to cover the pts pain until what was supposed to be todays visit (06/06/2015)...Marland KitchenMarland Kitchenplease advise

## 2015-06-06 NOTE — Telephone Encounter (Signed)
May call in Tylenol No. 4 one by mouth 3 times a day #63  This is stronger than Tylenol 3

## 2015-06-06 NOTE — Telephone Encounter (Signed)
Called to pharmacy and to [facility]

## 2015-06-07 DIAGNOSIS — H353113 Nonexudative age-related macular degeneration, right eye, advanced atrophic without subfoveal involvement: Secondary | ICD-10-CM | POA: Diagnosis not present

## 2015-06-07 DIAGNOSIS — H15833 Staphyloma posticum, bilateral: Secondary | ICD-10-CM | POA: Diagnosis not present

## 2015-06-07 DIAGNOSIS — Z961 Presence of intraocular lens: Secondary | ICD-10-CM | POA: Diagnosis not present

## 2015-06-07 DIAGNOSIS — H25812 Combined forms of age-related cataract, left eye: Secondary | ICD-10-CM | POA: Diagnosis not present

## 2015-06-07 DIAGNOSIS — M549 Dorsalgia, unspecified: Secondary | ICD-10-CM | POA: Diagnosis not present

## 2015-06-07 DIAGNOSIS — M4802 Spinal stenosis, cervical region: Secondary | ICD-10-CM | POA: Diagnosis not present

## 2015-06-07 DIAGNOSIS — R42 Dizziness and giddiness: Secondary | ICD-10-CM | POA: Diagnosis not present

## 2015-06-07 DIAGNOSIS — H52221 Regular astigmatism, right eye: Secondary | ICD-10-CM | POA: Diagnosis not present

## 2015-06-07 DIAGNOSIS — R55 Syncope and collapse: Secondary | ICD-10-CM | POA: Diagnosis not present

## 2015-06-07 DIAGNOSIS — H524 Presbyopia: Secondary | ICD-10-CM | POA: Diagnosis not present

## 2015-06-07 DIAGNOSIS — G894 Chronic pain syndrome: Secondary | ICD-10-CM | POA: Diagnosis not present

## 2015-06-07 DIAGNOSIS — H5201 Hypermetropia, right eye: Secondary | ICD-10-CM | POA: Diagnosis not present

## 2015-06-07 DIAGNOSIS — H1089 Other conjunctivitis: Secondary | ICD-10-CM | POA: Diagnosis not present

## 2015-06-07 DIAGNOSIS — M4722 Other spondylosis with radiculopathy, cervical region: Secondary | ICD-10-CM | POA: Diagnosis not present

## 2015-06-22 ENCOUNTER — Other Ambulatory Visit: Payer: Self-pay | Admitting: Physical Medicine & Rehabilitation

## 2015-06-24 DIAGNOSIS — Z79899 Other long term (current) drug therapy: Secondary | ICD-10-CM | POA: Diagnosis not present

## 2015-06-24 DIAGNOSIS — K219 Gastro-esophageal reflux disease without esophagitis: Secondary | ICD-10-CM | POA: Diagnosis not present

## 2015-06-24 DIAGNOSIS — Z79891 Long term (current) use of opiate analgesic: Secondary | ICD-10-CM | POA: Diagnosis not present

## 2015-06-24 DIAGNOSIS — I6523 Occlusion and stenosis of bilateral carotid arteries: Secondary | ICD-10-CM | POA: Diagnosis not present

## 2015-06-24 DIAGNOSIS — N390 Urinary tract infection, site not specified: Secondary | ICD-10-CM | POA: Diagnosis not present

## 2015-06-24 DIAGNOSIS — G319 Degenerative disease of nervous system, unspecified: Secondary | ICD-10-CM | POA: Diagnosis not present

## 2015-06-24 DIAGNOSIS — R51 Headache: Secondary | ICD-10-CM | POA: Diagnosis not present

## 2015-06-24 DIAGNOSIS — K5790 Diverticulosis of intestine, part unspecified, without perforation or abscess without bleeding: Secondary | ICD-10-CM | POA: Diagnosis not present

## 2015-06-24 DIAGNOSIS — M542 Cervicalgia: Secondary | ICD-10-CM | POA: Diagnosis not present

## 2015-06-24 DIAGNOSIS — R52 Pain, unspecified: Secondary | ICD-10-CM | POA: Diagnosis not present

## 2015-06-24 DIAGNOSIS — R55 Syncope and collapse: Secondary | ICD-10-CM | POA: Diagnosis not present

## 2015-06-24 DIAGNOSIS — Z885 Allergy status to narcotic agent status: Secondary | ICD-10-CM | POA: Diagnosis not present

## 2015-06-24 DIAGNOSIS — Z880 Allergy status to penicillin: Secondary | ICD-10-CM | POA: Diagnosis not present

## 2015-06-24 DIAGNOSIS — Z90710 Acquired absence of both cervix and uterus: Secondary | ICD-10-CM | POA: Diagnosis not present

## 2015-06-24 DIAGNOSIS — Z8673 Personal history of transient ischemic attack (TIA), and cerebral infarction without residual deficits: Secondary | ICD-10-CM | POA: Diagnosis not present

## 2015-06-24 DIAGNOSIS — R932 Abnormal findings on diagnostic imaging of liver and biliary tract: Secondary | ICD-10-CM | POA: Diagnosis not present

## 2015-06-24 DIAGNOSIS — I10 Essential (primary) hypertension: Secondary | ICD-10-CM | POA: Diagnosis not present

## 2015-06-24 DIAGNOSIS — E278 Other specified disorders of adrenal gland: Secondary | ICD-10-CM | POA: Diagnosis not present

## 2015-06-24 DIAGNOSIS — F17290 Nicotine dependence, other tobacco product, uncomplicated: Secondary | ICD-10-CM | POA: Diagnosis not present

## 2015-06-24 DIAGNOSIS — N281 Cyst of kidney, acquired: Secondary | ICD-10-CM | POA: Diagnosis not present

## 2015-06-25 DIAGNOSIS — R51 Headache: Secondary | ICD-10-CM | POA: Diagnosis not present

## 2015-06-25 DIAGNOSIS — R55 Syncope and collapse: Secondary | ICD-10-CM | POA: Diagnosis not present

## 2015-06-25 DIAGNOSIS — R932 Abnormal findings on diagnostic imaging of liver and biliary tract: Secondary | ICD-10-CM | POA: Diagnosis not present

## 2015-06-25 DIAGNOSIS — N390 Urinary tract infection, site not specified: Secondary | ICD-10-CM | POA: Insufficient documentation

## 2015-06-25 DIAGNOSIS — K5790 Diverticulosis of intestine, part unspecified, without perforation or abscess without bleeding: Secondary | ICD-10-CM | POA: Diagnosis not present

## 2015-06-25 DIAGNOSIS — E278 Other specified disorders of adrenal gland: Secondary | ICD-10-CM | POA: Diagnosis not present

## 2015-06-25 DIAGNOSIS — I6523 Occlusion and stenosis of bilateral carotid arteries: Secondary | ICD-10-CM | POA: Diagnosis not present

## 2015-06-25 DIAGNOSIS — G319 Degenerative disease of nervous system, unspecified: Secondary | ICD-10-CM | POA: Diagnosis not present

## 2015-06-26 ENCOUNTER — Telehealth: Payer: Self-pay

## 2015-06-26 NOTE — Telephone Encounter (Signed)
Pt was discharged from [facility] over the weekend. She went for a severe HA. In the hospital, they gave her morphine and Dilaudid to relieve the pain. They also gave her Norco 7.'5mg'$  to take home. Pt's daughter made the hospital aware that she sees pain management. Pt has an appt tomorrow to see you.

## 2015-06-27 ENCOUNTER — Ambulatory Visit (HOSPITAL_BASED_OUTPATIENT_CLINIC_OR_DEPARTMENT_OTHER): Payer: Medicare Other | Admitting: Physical Medicine & Rehabilitation

## 2015-06-27 ENCOUNTER — Encounter: Payer: Medicare Other | Attending: Physical Medicine & Rehabilitation

## 2015-06-27 ENCOUNTER — Encounter: Payer: Self-pay | Admitting: Physical Medicine & Rehabilitation

## 2015-06-27 VITALS — BP 124/71 | HR 65 | Resp 14

## 2015-06-27 DIAGNOSIS — S63502A Unspecified sprain of left wrist, initial encounter: Secondary | ICD-10-CM | POA: Diagnosis not present

## 2015-06-27 DIAGNOSIS — D649 Anemia, unspecified: Secondary | ICD-10-CM | POA: Insufficient documentation

## 2015-06-27 DIAGNOSIS — M5136 Other intervertebral disc degeneration, lumbar region: Secondary | ICD-10-CM | POA: Insufficient documentation

## 2015-06-27 DIAGNOSIS — I639 Cerebral infarction, unspecified: Secondary | ICD-10-CM | POA: Diagnosis not present

## 2015-06-27 DIAGNOSIS — M4802 Spinal stenosis, cervical region: Secondary | ICD-10-CM | POA: Diagnosis not present

## 2015-06-27 DIAGNOSIS — F329 Major depressive disorder, single episode, unspecified: Secondary | ICD-10-CM | POA: Insufficient documentation

## 2015-06-27 DIAGNOSIS — E785 Hyperlipidemia, unspecified: Secondary | ICD-10-CM | POA: Insufficient documentation

## 2015-06-27 DIAGNOSIS — M25512 Pain in left shoulder: Secondary | ICD-10-CM | POA: Insufficient documentation

## 2015-06-27 DIAGNOSIS — I10 Essential (primary) hypertension: Secondary | ICD-10-CM | POA: Insufficient documentation

## 2015-06-27 DIAGNOSIS — F17210 Nicotine dependence, cigarettes, uncomplicated: Secondary | ICD-10-CM | POA: Insufficient documentation

## 2015-06-27 DIAGNOSIS — S12600A Unspecified displaced fracture of seventh cervical vertebra, initial encounter for closed fracture: Secondary | ICD-10-CM | POA: Diagnosis not present

## 2015-06-27 DIAGNOSIS — Z79891 Long term (current) use of opiate analgesic: Secondary | ICD-10-CM | POA: Insufficient documentation

## 2015-06-27 DIAGNOSIS — H3530 Unspecified macular degeneration: Secondary | ICD-10-CM | POA: Diagnosis not present

## 2015-06-27 DIAGNOSIS — J449 Chronic obstructive pulmonary disease, unspecified: Secondary | ICD-10-CM | POA: Insufficient documentation

## 2015-06-27 DIAGNOSIS — K219 Gastro-esophageal reflux disease without esophagitis: Secondary | ICD-10-CM | POA: Insufficient documentation

## 2015-06-27 DIAGNOSIS — M1990 Unspecified osteoarthritis, unspecified site: Secondary | ICD-10-CM | POA: Insufficient documentation

## 2015-06-27 DIAGNOSIS — M810 Age-related osteoporosis without current pathological fracture: Secondary | ICD-10-CM | POA: Insufficient documentation

## 2015-06-27 DIAGNOSIS — M797 Fibromyalgia: Secondary | ICD-10-CM | POA: Insufficient documentation

## 2015-06-27 DIAGNOSIS — M7918 Myalgia, other site: Secondary | ICD-10-CM

## 2015-06-27 DIAGNOSIS — S22022A Unstable burst fracture of second thoracic vertebra, initial encounter for closed fracture: Secondary | ICD-10-CM | POA: Insufficient documentation

## 2015-06-27 DIAGNOSIS — M791 Myalgia: Secondary | ICD-10-CM

## 2015-06-27 MED ORDER — HYDROCODONE-ACETAMINOPHEN 5-325 MG PO TABS: 1.0000 | ORAL_TABLET | Freq: Two times a day (BID) | ORAL | Status: DC | PRN

## 2015-06-27 NOTE — Progress Notes (Signed)
75 year old female with history of COPD who has a one-year history of left-sided scapular pain. She has had multiple falls initially related to dizziness. The dizziness has improved. During one of her falls she suffered a C7 and T2 compression fracture. She's had MRI of the cervical and thoracic spine. In addition to the compression fractures there is evidence of cervical spondylosis. Patient has been seen by neurology in the past. 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  Interval medical history Was hospitalized for several days at [facility] for mental status changes. It is felt to be due to polypharmacy including use of Klonopin, Zanaflex,As well as the addition of buprenorphine patch. Patient had CT scan brainShowing no acute lesions, CT of the cervical spines showed a chronic C7 compression fracture  She was then hospitalized at [facility], diagnosed with UTI, reduced gabapentin to 400 twice a day, recommended stopping tizanidine. The daughter states that she still gives the patient tizanidine on bad days this may be a couple times a week. She has not started her ciprofloxacin for the UTI yet. She was just discharged from the hospital 2 days ago. Patient still takes alprazolam 1 mg at night she did take 1 today because they were thinking she may have a neck injection today.  Review of past history also reveals she's been worked up by neurology for vertigo dizziness loss of balance syncopal spells. There is no further workup recommended an no obvious pathology detected.  Patient did have an MRI of her cervical spine showing C4-5 and C5-6 stenosis left greater than right side. At the foramen  She had trigger point injections done about 2 months ago which were helpful, Trapezius and levator muscles  Examination Gen. No acute distress Mood and affect are inappropriate she appears childlike Speech  is mildly dysarthric, daughter thinks it may be the Xanax She is oriented to person place and time except for the year she states it's 2016 rather than 2017 She has good immediate recall.  Her motor strength is 5/5 bilateral deltoid, biceps, triceps, grip She has tenderness to palpation left upper trapezius left infraspinatus left levator as well as right  Lungs are clear Heart regular rate and rhythm no murmurs Abdomen positive bowel sounds soft nontender palpation Extremities no coming cyanosis or edema  Impression 1. Neck pain left shoulder pain and multifactorial, chronic compression fracture C7, myofascial pain syndrome, cervical spondylosis as well as cervical stenosis which is worse on the left side which may be causing some radiating pain. No other objective evidence of cervical radiculopathy  Plan we'll repeat trigger point injections today Will minimize use of tizanidine May use with flareups of muscle spasms couple times a week as she is doing., trialed hydrocodone 5 mg twice a day, caution regarding balance  issues. Benefit [place]. She does not qualify for home bound  Trigger Point Injection  Indication: Bilateral trapezius, left infraspinatus, left levator Myofascial pain not relieved by medication management and other conservative care.  Informed consent was obtained after describing risk and benefits of the procedure with the patient, this includes bleeding, bruising, infection and medication side effects.  The patient wishes to proceed and has given written consent.  The patient was placed in a seated position.  The Muscles listed above were marked and prepped with Betadine.  It was entered  with a 25-gauge 1-1/2 inch needle and 1 mL of 1% lidocaine was injected into each of 4 trigger points, after negative draw back for blood.  The patient tolerated the procedure well.  Post procedure instructions were given.

## 2015-06-27 NOTE — Progress Notes (Signed)
   05/23/15 1314  PT G-Codes **NOT FOR INPATIENT CLASS**  Functional Assessment Tool Used assist level  Functional Limitation Mobility: Walking and moving around  Mobility: Walking and Moving Around Current Status [phone]) CJ  Mobility: Walking and Moving Around Goal Status [phone]) CI  07-22-15 late entry g-codes Robbin Escher B. [place], [place], DPT [phone]

## 2015-06-27 NOTE — Patient Instructions (Signed)
Trigger Point Injection Trigger points are areas where you have muscle pain. A trigger point injection is a shot given in the trigger point to relieve that pain. A trigger point might feel like a knot in your muscle. It hurts to press on a trigger point. Sometimes the pain spreads out (radiates) to other parts of the body. For example, pressing on a trigger point in your shoulder might cause pain in your arm or neck. You might have one trigger point. Or, you might have more than one. People often have trigger points in their upper back and lower back. They also occur often in the neck and shoulders. Pain from a trigger point lasts for a long time. It can make it hard to keep moving. You might not be able to do the exercise or physical therapy that could help you deal with the pain. A trigger point injection may help. It does not work for everyone. But, it may relieve your pain for a few days or a few months. A trigger point injection does not cure long-lasting (chronic) pain. LET YOUR CAREGIVER KNOW ABOUT:  Any allergies (especially to latex, lidocaine, or steroids).  Blood-thinning medicines that you take. These drugs can lead to bleeding or bruising after an injection. They include:  Aspirin.  Ibuprofen.  Clopidogrel.  Warfarin.  Other medicines you take. This includes all vitamins, herbs, eyedrops, over-the-counter medicines, and creams.  Use of steroids.  Recent infections.  Past problems with numbing medicines.  Bleeding problems.  Surgeries you have had.  Other health problems. RISKS AND COMPLICATIONS A trigger point injection is a safe treatment. However, problems may develop, such as:  Minor side effects usually go away in 1 to 2 days. These may include:  Soreness.  Bruising.  Stiffness.  More serious problems are rare. But, they may include:  Bleeding under the skin (hematoma).  Skin infection.  Breaking off of the needle under your skin.  Lung  puncture.  The trigger point injection may not work for you. BEFORE THE PROCEDURE You may need to stop taking any medicine that thins your blood. This is to prevent bleeding and bruising. Usually these medicines are stopped several days before the injection. No other preparation is needed. PROCEDURE  A trigger point injection can be given in your caregiver's office or in a clinic. Each injection takes 2 minutes or less.  Your caregiver will feel for trigger points. The caregiver may use a marker to circle the area for the injection.  The skin over the trigger point will be washed with a germ-killing (antiseptic) solution.  The caregiver pinches the spot for the injection.  Then, a very thin needle is used for the shot. You may feel pain or a twitching feeling when the needle enters the trigger point.  A numbing solution may be injected into the trigger point. Sometimes a drug to keep down swelling, redness, and warmth (inflammation) is also injected.  Your caregiver moves the needle around the trigger zone until the tightness and twitching goes away.  After the injection, your caregiver may put gentle pressure over the injection site.  Then it is covered with a bandage. AFTER THE PROCEDURE  You can go right home after the injection.  The bandage can be taken off after a few hours.  You may feel sore and stiff for 1 to 2 days.  Go back to your regular activities slowly. Your caregiver may ask you to stretch your muscles. Do not do anything that takes  extra energy for a few days.  Follow your caregiver's instructions to manage and treat other pain.   This information is not intended to replace advice given to you by your health care provider. Make sure you discuss any questions you have with your health care provider.   Document Released: 05/02/2011 Document Revised: 09/07/2012 Document Reviewed: 05/02/2011 Elsevier Interactive Patient Education [employer].

## 2015-07-04 ENCOUNTER — Telehealth: Payer: Self-pay | Admitting: *Deleted

## 2015-07-04 NOTE — Telephone Encounter (Signed)
[name] called from [facility] to ask if it is ok to fill the norco since they see she had Butrans patch filled at [facility].  I checked and the Butrans was stopped and it is ok to fill the Norco.

## 2015-07-05 NOTE — Progress Notes (Signed)
OT Note - Addendum    06/20/2015 1348  OT Visit Information  Last OT Received On 2015-06-20  OT G-codes **NOT FOR INPATIENT CLASS**  Functional Assessment Tool Used clinical judgement  Functional Limitation Self care  Self Care Current Status [phone]) CJ  Self Care Goal Status [phone]) CI  [facility], OTR/L  [phone] 2015-06-20

## 2015-07-12 DIAGNOSIS — K219 Gastro-esophageal reflux disease without esophagitis: Secondary | ICD-10-CM | POA: Diagnosis not present

## 2015-07-12 DIAGNOSIS — F324 Major depressive disorder, single episode, in partial remission: Secondary | ICD-10-CM | POA: Diagnosis not present

## 2015-07-12 DIAGNOSIS — G8929 Other chronic pain: Secondary | ICD-10-CM | POA: Diagnosis not present

## 2015-07-12 DIAGNOSIS — H33052 Total retinal detachment, left eye: Secondary | ICD-10-CM | POA: Diagnosis not present

## 2015-07-12 DIAGNOSIS — E7800 Pure hypercholesterolemia, unspecified: Secondary | ICD-10-CM | POA: Diagnosis not present

## 2015-07-12 DIAGNOSIS — I10 Essential (primary) hypertension: Secondary | ICD-10-CM | POA: Diagnosis not present

## 2015-07-12 DIAGNOSIS — H35371 Puckering of macula, right eye: Secondary | ICD-10-CM | POA: Diagnosis not present

## 2015-07-12 DIAGNOSIS — R300 Dysuria: Secondary | ICD-10-CM | POA: Diagnosis not present

## 2015-07-12 DIAGNOSIS — H2522 Age-related cataract, morgagnian type, left eye: Secondary | ICD-10-CM | POA: Diagnosis not present

## 2015-07-12 DIAGNOSIS — H15831 Staphyloma posticum, right eye: Secondary | ICD-10-CM | POA: Diagnosis not present

## 2015-07-12 DIAGNOSIS — E538 Deficiency of other specified B group vitamins: Secondary | ICD-10-CM | POA: Diagnosis not present

## 2015-07-12 DIAGNOSIS — J449 Chronic obstructive pulmonary disease, unspecified: Secondary | ICD-10-CM | POA: Diagnosis not present

## 2015-07-14 ENCOUNTER — Telehealth: Payer: Self-pay | Admitting: *Deleted

## 2015-07-14 NOTE — Telephone Encounter (Signed)
Spoke with [employer]. Advised taking 1-1/2 tablets.

## 2015-07-14 NOTE — Telephone Encounter (Signed)
[name] says she has tried the TENS and the lidocaine and it has not helped.  She is asking if she can take two of her hydrocodone instead of one. Her next appt is 07/21/15 but it is with Zella Ball.

## 2015-07-14 NOTE — Telephone Encounter (Signed)
May benefit from a new technology called biowave we'll schedule her for treatments  May want to try something called a TENS unit which she can buy in the drugstore. I see hot makes one and there are some other manufacturers. That can be used for a couple hours 3 times per day  Other alternatives would include trying a cream  with lidocaine   Which is also available in the drugstore.

## 2015-07-14 NOTE — Telephone Encounter (Signed)
May try 1-1/2 hydrocodone. That is 7.5 mg.

## 2015-07-14 NOTE — Telephone Encounter (Signed)
[name] called and her mother is crying in pain.  The trigger points didnt help and nothing is taking the pain away. Please advise.

## 2015-07-21 ENCOUNTER — Other Ambulatory Visit: Payer: Self-pay | Admitting: Physical Medicine & Rehabilitation

## 2015-07-21 ENCOUNTER — Encounter: Payer: Medicare Other | Attending: Physical Medicine & Rehabilitation | Admitting: Registered Nurse

## 2015-07-21 ENCOUNTER — Encounter: Payer: Self-pay | Admitting: Registered Nurse

## 2015-07-21 VITALS — BP 131/78 | HR 69 | Resp 14

## 2015-07-21 DIAGNOSIS — F17210 Nicotine dependence, cigarettes, uncomplicated: Secondary | ICD-10-CM | POA: Diagnosis not present

## 2015-07-21 DIAGNOSIS — Z79891 Long term (current) use of opiate analgesic: Secondary | ICD-10-CM | POA: Diagnosis not present

## 2015-07-21 DIAGNOSIS — M797 Fibromyalgia: Secondary | ICD-10-CM | POA: Insufficient documentation

## 2015-07-21 DIAGNOSIS — S12600A Unspecified displaced fracture of seventh cervical vertebra, initial encounter for closed fracture: Secondary | ICD-10-CM | POA: Insufficient documentation

## 2015-07-21 DIAGNOSIS — M4802 Spinal stenosis, cervical region: Secondary | ICD-10-CM

## 2015-07-21 DIAGNOSIS — M791 Myalgia: Secondary | ICD-10-CM | POA: Diagnosis not present

## 2015-07-21 DIAGNOSIS — M25512 Pain in left shoulder: Secondary | ICD-10-CM | POA: Diagnosis not present

## 2015-07-21 DIAGNOSIS — I10 Essential (primary) hypertension: Secondary | ICD-10-CM | POA: Diagnosis not present

## 2015-07-21 DIAGNOSIS — D649 Anemia, unspecified: Secondary | ICD-10-CM | POA: Insufficient documentation

## 2015-07-21 DIAGNOSIS — M1990 Unspecified osteoarthritis, unspecified site: Secondary | ICD-10-CM | POA: Diagnosis not present

## 2015-07-21 DIAGNOSIS — S63502A Unspecified sprain of left wrist, initial encounter: Secondary | ICD-10-CM | POA: Insufficient documentation

## 2015-07-21 DIAGNOSIS — H3530 Unspecified macular degeneration: Secondary | ICD-10-CM | POA: Diagnosis not present

## 2015-07-21 DIAGNOSIS — M810 Age-related osteoporosis without current pathological fracture: Secondary | ICD-10-CM | POA: Insufficient documentation

## 2015-07-21 DIAGNOSIS — K219 Gastro-esophageal reflux disease without esophagitis: Secondary | ICD-10-CM | POA: Diagnosis not present

## 2015-07-21 DIAGNOSIS — Z79899 Other long term (current) drug therapy: Secondary | ICD-10-CM

## 2015-07-21 DIAGNOSIS — M5136 Other intervertebral disc degeneration, lumbar region: Secondary | ICD-10-CM | POA: Diagnosis not present

## 2015-07-21 DIAGNOSIS — F329 Major depressive disorder, single episode, unspecified: Secondary | ICD-10-CM | POA: Diagnosis not present

## 2015-07-21 DIAGNOSIS — Z5181 Encounter for therapeutic drug level monitoring: Secondary | ICD-10-CM

## 2015-07-21 DIAGNOSIS — E785 Hyperlipidemia, unspecified: Secondary | ICD-10-CM | POA: Insufficient documentation

## 2015-07-21 DIAGNOSIS — S22022A Unstable burst fracture of second thoracic vertebra, initial encounter for closed fracture: Secondary | ICD-10-CM | POA: Insufficient documentation

## 2015-07-21 DIAGNOSIS — G894 Chronic pain syndrome: Secondary | ICD-10-CM

## 2015-07-21 DIAGNOSIS — I639 Cerebral infarction, unspecified: Secondary | ICD-10-CM | POA: Insufficient documentation

## 2015-07-21 DIAGNOSIS — M7918 Myalgia, other site: Secondary | ICD-10-CM

## 2015-07-21 DIAGNOSIS — J449 Chronic obstructive pulmonary disease, unspecified: Secondary | ICD-10-CM | POA: Diagnosis not present

## 2015-07-21 MED ORDER — HYDROCODONE-ACETAMINOPHEN 7.5-325 MG PO TABS: 1.0000 | ORAL_TABLET | Freq: Two times a day (BID) | ORAL | Status: DC | PRN

## 2015-07-21 NOTE — Progress Notes (Deleted)
   Subjective:    Patient ID: [name], female    DOB: Apr 30, 1941, 75 y.o.   MRN: [id number]  HPI: [name] is a 75 year old female who returns for follow up appointment and medication refill. When asked where her pain was, her daughter [name] would answered for her. I asked [name] to allow her mother to answer the questions.  [name] began to state her pain is located in her neck radiating down her arms and lower back.  S/P Trigger Point injection with 2 weeks of relief she states. [name] was looking for an increase in medications we will continue with hydrocodone 7.5 mg BID. According to Dr. Letta [name] note on  05/04/15 if she is unable to obtain relief he would refer her to Orthopedic Spine Surgery. Educated on analgesics and we need to treat the issue and not mask the problem. She verbalizes understanding. Asked to see Dr. Letta [name] next month. Also admits she is feeling weak, we will refer to physical therapy. She asked for home therapy.  She rates her pain 9. Her current exercise regime is walking.     Review of Systems     Objective:   Physical Exam        Assessment & Plan:

## 2015-07-21 NOTE — Patient Instructions (Signed)
To Increase Gabapentin To     One Tablet 4 times a day

## 2015-07-21 NOTE — Progress Notes (Signed)
Subjective:    Patient ID: [name], female    DOB: 05-13-1941, 75 y.o.   MRN: [id number]  HPI:  [name] is a 75 year old female who returns for follow up appointment and medication refill. When asked where her pain was, her daughter [name] would answered for her. I asked [name] to allow her mother to answer the questions.  [name] began to state her pain is located in her neck radiating down her arms and lower back.  S/P Trigger Point injection with 2 weeks of relief she states. [name] was looking for an increase in medications we will continue with hydrocodone 7.5 mg BID. According to Dr. Letta Pate note on  05/04/15 if she was unable to obtain relief he would refer her to Orthopedic Spine Surgery. Educated on analgesics and we need to treat the issue and not mask the problem. She began to cry and hold her head, her daughter began to states " her mother needs her pain controlled". Treatment plan re-iterated and they stated they understood.  They asked asked to see Dr. Letta Pate next month. Also admits she is feeling weak,  Will wait to initiate  Physical therapy until she is seen by Dr. Letta Pate. She rates her pain 9. Her current exercise regime is walking.  Daughter in room all questions answered.  Pain Inventory Average Pain 9 Pain Right Now 9 My pain is intermittent and constant  In the last 24 hours, has pain interfered with the following? General activity 9 Relation with others 9 Enjoyment of life 9 What TIME of day is your pain at its worst? NA Sleep (in general) NA  Pain is worse with: NA Pain improves with: NA Relief from Meds: 2  Mobility walk without assistance walk with assistance ability to climb steps?  no do you drive?  no  Function Do you have any goals in this area?  no  Neuro/Psych weakness numbness tingling trouble walking depression anxiety  Prior Studies Any changes since last visit?  no  Physicians involved in your  care Any changes since last visit?  no   Family History  Problem Relation Age of Onset  . Heart failure Mother   . Heart attack Father    Social History   Social History  . Marital Status: Single    Spouse Name: N/A  . Number of Children: 3  . Years of Education: middle sch   Occupational History  . retired     n/a   Social History Main Topics  . Smoking status: Former Smoker -- 1.50 packs/day for 30 years  . Smokeless tobacco: None     Comment: quit 2013  . Alcohol Use: No  . Drug Use: No  . Sexual Activity: No   Other Topics Concern  . None   Social History Narrative   ** Merged History Encounter **       Patient lives at home with daughter. Caffeine Use: 4 cups daily   Past Surgical History  Procedure Laterality Date  . Abdominal surgery    . Colon surgery    . Joint replacement    . Appendectomy    . Bladder surgery    . Rectocele repair    . Knee surgery    . Cholecystectomy    . Abdominal hysterectomy    . Ostomy    . Eye surgery      cataracts   Past Medical History  Diagnosis Date  . Hiatal hernia   .  Anemia   . GERD (gastroesophageal reflux disease)   . Hyperlipidemia   . Depression     major  . Stroke [facility])     "mini stroke"  . Osteoarthritis   . Reactive airway disease   . B12 deficiency   . Osteoporosis   . Rupture of bowel ([place])   . Bladder incontinence   . DDD (degenerative disc disease), lumbar   . Cataract   . COPD (chronic obstructive pulmonary disease) ([place])   . Hypertension   . Stroke ([place])   . Macular degeneration   . Blind left eye   . Cervical spine fracture ([place])   . Compression fracture     C7,  upper T spine -compression fx   BP 131/78 mmHg  Pulse 69  Resp 14  SpO2 98%  Opioid Risk [name]:   Fall Risk [name]:  `1  Depression screen PHQ 2/9  Depression screen PHQ 2/9 03/24/2015  Decreased Interest 2  Down, Depressed, Hopeless 3  PHQ - 2 [name] 5  Altered sleeping 3  Tired, decreased energy 3  Change  in appetite 2  Feeling bad or failure about yourself  2  Trouble concentrating 1  Moving slowly or fidgety/restless 0  Suicidal thoughts 0  PHQ-9 [name] 16  Difficult doing work/chores Somewhat difficult     Review of Systems  Musculoskeletal: Positive for gait problem.  Neurological: Positive for weakness and numbness.       Tingling   Psychiatric/Behavioral: Positive for dysphoric mood. The patient is nervous/anxious.   All other systems reviewed and are negative.      Objective:   Physical Exam  Constitutional: She is oriented to person, place, and time. She appears well-developed and well-nourished.  HENT:  Head: Normocephalic and atraumatic.  Neck: Normal range of motion. Neck supple.  Cardiovascular: Normal rate and regular rhythm.   Pulmonary/Chest: Effort normal and breath sounds normal.  Musculoskeletal:  Normal Muscle Bulk and Muscle Testing Reveals: Upper Extremities: Full ROM and Muscle Strength 5/5 Left AC Joint Tenderness Thoracic Paraspinal Tenderness: T-1- T-3 Mainly Left Side Lower Extremities: Full ROM and Muscle Strength 5/5 Arises from chair with ease Narrow Based Gait  Neurological: She is alert and oriented to person, place, and time.  Skin: Skin is warm and dry.  Psychiatric: She has a normal mood and affect.  Nursing note and vitals reviewed.         Assessment & Plan:  1. Complex multifactorial pain left side neck and shoulder.3 main etiologies: A. myofascial pain left trapezius, left infraspinatus B. Cervical spinal stenosis with left C4-C5 radiculopathy C. Cervical spondylosis with facet arthropathy RX: Hydrocodone 7.5/325 mg one tablet twice a day as needed for moderate pain #60. Increase Gabapentin to Four times a day. Instructed to call office in a week for evaluation of medication regime.  She will see Dr. Letta Pate next month to discuss the Referral to Orthopedic Spine Surgery.  45 minutes of face to face patient care time was  spent during this visit. All questions were encouraged and answered.

## 2015-07-22 ENCOUNTER — Other Ambulatory Visit: Payer: Self-pay

## 2015-07-22 MED ORDER — GABAPENTIN 400 MG PO CAPS: 400.0000 mg | ORAL_CAPSULE | Freq: Four times a day (QID) | ORAL | Status: DC

## 2015-07-22 NOTE — Telephone Encounter (Signed)
Gabapentin increased per ET's office visit note to four times daily.

## 2015-08-10 ENCOUNTER — Telehealth: Payer: Self-pay

## 2015-08-10 NOTE — Telephone Encounter (Signed)
[name] called in regards to the pt. She states that the pt is experiencing new issues. Her hands are "going to sleep" and when they are "waking up", there is severe pain. She is now getting pain down in her calves. [name] states "She is at the point that she wants to go to the ER due to so much pain. She is taking her all of her medications like she is supposed to. [name] told us we  are not getting anymore pain medicine but we are not looking for more pain medicine. She is looking for pain management. If a pain medicine is not working, there should be something else you can do to get her pain under control. Someone needs to help my mom."

## 2015-08-10 NOTE — Telephone Encounter (Signed)
May schedule for Left wrist carpal tunnel injection

## 2015-08-10 NOTE — Telephone Encounter (Signed)
Can you please schedule if pt is willing?

## 2015-08-14 ENCOUNTER — Encounter: Payer: Self-pay | Admitting: Physical Medicine & Rehabilitation

## 2015-08-14 ENCOUNTER — Other Ambulatory Visit: Payer: Self-pay | Admitting: Registered Nurse

## 2015-08-14 ENCOUNTER — Ambulatory Visit (HOSPITAL_BASED_OUTPATIENT_CLINIC_OR_DEPARTMENT_OTHER): Payer: Medicare Other | Admitting: Physical Medicine & Rehabilitation

## 2015-08-14 ENCOUNTER — Encounter: Payer: Medicare Other | Attending: Physical Medicine & Rehabilitation

## 2015-08-14 VITALS — BP 140/78 | HR 67 | Resp 14

## 2015-08-14 DIAGNOSIS — E785 Hyperlipidemia, unspecified: Secondary | ICD-10-CM | POA: Diagnosis not present

## 2015-08-14 DIAGNOSIS — K219 Gastro-esophageal reflux disease without esophagitis: Secondary | ICD-10-CM | POA: Insufficient documentation

## 2015-08-14 DIAGNOSIS — M4802 Spinal stenosis, cervical region: Secondary | ICD-10-CM | POA: Diagnosis not present

## 2015-08-14 DIAGNOSIS — F17210 Nicotine dependence, cigarettes, uncomplicated: Secondary | ICD-10-CM | POA: Insufficient documentation

## 2015-08-14 DIAGNOSIS — M5136 Other intervertebral disc degeneration, lumbar region: Secondary | ICD-10-CM | POA: Diagnosis not present

## 2015-08-14 DIAGNOSIS — Z79891 Long term (current) use of opiate analgesic: Secondary | ICD-10-CM | POA: Diagnosis not present

## 2015-08-14 DIAGNOSIS — M810 Age-related osteoporosis without current pathological fracture: Secondary | ICD-10-CM | POA: Insufficient documentation

## 2015-08-14 DIAGNOSIS — M797 Fibromyalgia: Secondary | ICD-10-CM | POA: Insufficient documentation

## 2015-08-14 DIAGNOSIS — Z79899 Other long term (current) drug therapy: Secondary | ICD-10-CM

## 2015-08-14 DIAGNOSIS — Z5181 Encounter for therapeutic drug level monitoring: Secondary | ICD-10-CM

## 2015-08-14 DIAGNOSIS — S22022A Unstable burst fracture of second thoracic vertebra, initial encounter for closed fracture: Secondary | ICD-10-CM | POA: Insufficient documentation

## 2015-08-14 DIAGNOSIS — S63502A Unspecified sprain of left wrist, initial encounter: Secondary | ICD-10-CM | POA: Diagnosis not present

## 2015-08-14 DIAGNOSIS — G894 Chronic pain syndrome: Secondary | ICD-10-CM | POA: Diagnosis not present

## 2015-08-14 DIAGNOSIS — I639 Cerebral infarction, unspecified: Secondary | ICD-10-CM | POA: Diagnosis not present

## 2015-08-14 DIAGNOSIS — H3530 Unspecified macular degeneration: Secondary | ICD-10-CM | POA: Insufficient documentation

## 2015-08-14 DIAGNOSIS — D649 Anemia, unspecified: Secondary | ICD-10-CM | POA: Diagnosis not present

## 2015-08-14 DIAGNOSIS — F329 Major depressive disorder, single episode, unspecified: Secondary | ICD-10-CM | POA: Insufficient documentation

## 2015-08-14 DIAGNOSIS — M25512 Pain in left shoulder: Secondary | ICD-10-CM | POA: Diagnosis not present

## 2015-08-14 DIAGNOSIS — J449 Chronic obstructive pulmonary disease, unspecified: Secondary | ICD-10-CM | POA: Diagnosis not present

## 2015-08-14 DIAGNOSIS — I10 Essential (primary) hypertension: Secondary | ICD-10-CM | POA: Diagnosis not present

## 2015-08-14 DIAGNOSIS — M1990 Unspecified osteoarthritis, unspecified site: Secondary | ICD-10-CM | POA: Insufficient documentation

## 2015-08-14 DIAGNOSIS — S12600A Unspecified displaced fracture of seventh cervical vertebra, initial encounter for closed fracture: Secondary | ICD-10-CM | POA: Insufficient documentation

## 2015-08-14 MED ORDER — MORPHINE SULFATE 15 MG PO TABS: 15.0000 mg | ORAL_TABLET | Freq: Two times a day (BID) | ORAL | Status: DC

## 2015-08-14 NOTE — Patient Instructions (Signed)
We will try a different pain medicine we will try morphine immediate release 15 mg twice a day. Monitor for sedation   You may benefit from a left wrist injection to help with left hand numbness.  We discussed that any of these pain medicines will only take away a little bit of the pain and not even half the pain.  Heat alternating with ice should be helpful.  Talk to your family doctor about your ear pain  Talk to her neurologist about your head pain  If you would like a referral to another pain doctor we can do this Dr Cleda Mccreedy is with [facility] in [place]

## 2015-08-14 NOTE — Progress Notes (Signed)
Subjective:    Patient ID: [name], female    DOB: 1940-09-22, 75 y.o.   MRN: [id number]  HPI 75 year old female with A fall about one year ago. She had neurosurgical evaluation and workup revealing left C5-C6 and C4-C5 foraminal stenosis as well as an acute compression fracture T2 without evidence of retropulsion. Neurosurgery did not recommend any surgery. She is recommended to have pain management. She has had pain in the scapular stabilizer muscles as well as along the cervical paraspinals. She has trialed physical therapy, she has trialed nonnarcotic medications as well as buprenorphine patch. She had some mental status changes with the buprenorphine patch however was taken multiple other medications such as muscle relaxers at that time as well. Patient was taken off the buprenorphine patch as well as the muscle relaxers                                            At Current pain medication hydrocodone 7.5 mg twice a day.  According to the patient's daughter the patient's antidepressant medications have been changed recently  Patient states that she cannot do the things she used to enjoy because of her pain. She's had problems with balance and falls. She's been recommended to use a walker whenever she does not comply with this recommendation.  Other complaints include left hand numbness. 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 We discussed injections but the patient declined Pain Inventory Average Pain 8 Pain Right Now 8 My pain is constant, sharp, burning, dull, stabbing, tingling and aching  In the last 24 hours, has pain interfered with the following? General activity 8 Relation with others 6 Enjoyment of life 8 What TIME of day is your pain at its worst? all Sleep (in general) Fair  Pain is worse with: unsure Pain improves with: medication Relief from Meds: 2  Mobility walk without assistance walk with assistance use a walker  Function Do  you have any goals in this area?  yes  Neuro/Psych weakness numbness tremor tingling depression anxiety  Prior Studies Any changes since last visit?  no for assessment of sepsis.  EXAM: MRI CERVICAL AND LUMBAR SPINE WITHOUT CONTRAST  TECHNIQUE: Multiplanar and multiecho pulse sequences of the cervical spine, to include the craniocervical junction and cervicothoracic junction, and thoracic spine, were obtained without intravenous contrast. Please note, motion degraded examination. Per technologist note, patient did not wish to receive intravenous contrast due to pain, examination is noncontrast.  COMPARISON: CT of the cervical spine Oct 05, 2014  FINDINGS: MRI CERVICAL SPINE FINDINGS  Motion degraded examination.  Moderate C7 compression fracture, low T1 and bright T2 signal with 30-50% height loss. No retropulsed bony fragments. Remaining cervical vertebral bodies appear intact. Maintenance of cervical lordosis. Intervertebral discs demonstrate normal morphology, decreased T2 signal within all cervical disc most consistent with desiccation.  Low signal measuring up to 6 mm in AP dimension posterior to the odontoid process consistent with pannus, can be seen with CPPD without erosions or edema. Cervical spinal cord appears normal morphology and signal characteristics though, motion degrades sensitivity for subtle cord signal abnormality. No syrinx. Included prevertebral and paraspinal soft tissues are normal.  Level by level evaluation:  C2-3: Moderate facet arthropathy. No disc bulge, canal stenosis or neural foraminal narrowing.  C3-4: Annular bulging, uncovertebral hypertrophy. Moderate LEFT greater than RIGHT facet arthropathy.  Very mild canal stenosis. Mild LEFT neural foraminal narrowing.  C4-5: Small broad-based disc bulge, mild to moderate facet arthropathy and uncovertebral hypertrophy. Moderate canal stenosis. Moderate to severe LEFT  greater than RIGHT neural foraminal narrowing.  C5-6: Small broad-based disc bulge, uncovertebral hypertrophy and moderate facet arthropathy. Mild canal stenosis. Moderate RIGHT, moderate to severe LEFT neural foraminal narrowing.  C6-7: Severely limited by motion and habitus. No definite disc bulge. At least moderate facet arthropathy without canal stenosis or neural foraminal narrowing.  C7-T1: No definite disc bulge, canal stenosis. At least mild facet arthropathy without neural foraminal narrowing.  MRI THORACIC SPINE FINDINGS  Moderate acute appearing T2 compression fracture with 30-50% height loss. Mild acute appearing T3 compression fracture. The remaining thoracic vertebral bodies appear intact. Maintenance of thoracic kyphosis. Intervertebral discs demonstrate relatively preserved morphology and signal characteristics. Minimal subacute to chronic discogenic endplate change of lower thoracic spine. 13 mm T6 hemangioma. Scattered chronic Schmorl's nodes.  Thoracic spinal cord appears normal in morphology and signal characteristics of the level the conus medullaris which terminates at T12-L1. Motion decreases sensitivity for potential cord signal abnormality.  No significant disc bulge, canal stenosis or neural foraminal narrowing any thoracic level.  IMPRESSION: Please note, examination was ordered with contrast though, after noncontrast portion, patient was unable to tolerate imaging due to pain and contrast-enhanced sequences not performed.  MRI CERVICAL SPINE: Acute moderate C7 compression fracture. No malalignment.  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 Multilevel neural foraminal narrowing: Moderate to severe on the LEFT at C4-5 and C5-6.  MRI THORACIC SPINE: Acute moderate T2 compression fracture. Mild acute T3 compression fracture. No malalignment.   Electronically Signed  By: Elon Alas  On: 10/08/2014 04:30 Physicians involved in your care Any changes since last visit?  no   Family History  Problem Relation Age of Onset  . Heart failure Mother   . Heart attack Father    Social History   Social History  . Marital Status: Single    Spouse Name: N/A  . Number of Children: 3  . Years of Education: middle sch   Occupational History  . retired     n/a   Social History Main Topics  . Smoking status: Former Smoker -- 1.50 packs/day for 30 years  . Smokeless tobacco: None     Comment: quit 2013  . Alcohol Use: No  . Drug Use: No  . Sexual Activity: No   Other Topics Concern  . None   Social History Narrative   ** Merged History Encounter **       Patient lives at home with daughter. Caffeine Use: 4 cups daily   Past Surgical History  Procedure Laterality Date  . Abdominal surgery    . Colon surgery    . Joint replacement    . Appendectomy    . Bladder surgery    . Rectocele repair    . Knee surgery    . Cholecystectomy    . Abdominal hysterectomy    . Ostomy    . Eye surgery      cataracts   Past Medical History  Diagnosis Date  . Hiatal hernia   . Anemia   . GERD (gastroesophageal reflux disease)   . Hyperlipidemia   . Depression     major  . Stroke [facility])     "mini stroke"  . Osteoarthritis   . Reactive airway disease   . B12 deficiency   .  Osteoporosis   . Rupture of bowel ([place])   . Bladder incontinence   . DDD (degenerative disc disease), lumbar   . Cataract   . COPD (chronic obstructive pulmonary disease) ([place])   . Hypertension   . Stroke ([place])   . Macular degeneration   . Blind left eye   . Cervical spine fracture ([place])   . Compression fracture     C7,  upper T spine -compression fx   BP 140/78 mmHg  Pulse 67  Resp 14  SpO2 97%  Opioid Risk [name]:   Fall Risk [name]:  `1  Depression screen PHQ 2/9  Depression screen PHQ 2/9 03/24/2015  Decreased Interest 2  Down, Depressed, Hopeless 3  PHQ - 2 [name]  5  Altered sleeping 3  Tired, decreased energy 3  Change in appetite 2  Feeling bad or failure about yourself  2  Trouble concentrating 1  Moving slowly or fidgety/restless 0  Suicidal thoughts 0  PHQ-9 [name] 16  Difficult doing work/chores Somewhat difficult     Review of Systems  All other systems reviewed and are negative.      Objective:   Physical Exam  Constitutional: She is oriented to person, place, and time. She appears well-developed and well-nourished.  HENT:  Head: Normocephalic and atraumatic.  Eyes: Conjunctivae are normal. Pupils are equal, round, and reactive to light.  Neck:  Cervical range of motion is 50% flexion and extension lateral bending and rotation  Neurological: She is alert and oriented to person, place, and time.  Psychiatric: Her affect is inappropriate. She expresses inappropriate judgment.  Patient is simplistic makes inappropriate comments She is inattentive.  Nursing note and vitals reviewed. Motor strength is 5/5 bilateral deltoids, biceps, triceps, grip, hip flexor, knee extensor, ankle dorsi and plantar flexor  Left   median distribution decreased sensation      Assessment & Plan:  1.   Cervical spinal stenosis with chronic neck pain.No current signs of myelopathy or radiculopathy. She has tried nonnarcotic medications as well as narcotic analgesics as well as physical therapy. She's been reluctant to undergo injections Such as cervical medial branch blocks, she fears that it may hurt. She did get a very short-term relief with trigger point injections.  She lives in the [place] area, I'm making a referral to [place] pain to see if she may be a good candidate for cervical epidurals or other injections under moderate Sedation. I instructed patient and her daughter to inform her clinic should they establish care at the pain center in [place] of morphine sulfate immediate release 15 mg twice a day Monitor for  sedation  Discussed with patient as well as her daughter separately Over half of the 25 min visit was spent counseling and coordinating care.

## 2015-08-15 ENCOUNTER — Encounter: Payer: Self-pay | Admitting: Diagnostic Neuroimaging

## 2015-08-15 ENCOUNTER — Ambulatory Visit (INDEPENDENT_AMBULATORY_CARE_PROVIDER_SITE_OTHER): Payer: Medicare Other | Admitting: Diagnostic Neuroimaging

## 2015-08-15 VITALS — BP 114/71 | HR 70 | Ht 64.0 in | Wt 183.0 lb

## 2015-08-15 DIAGNOSIS — R269 Unspecified abnormalities of gait and mobility: Secondary | ICD-10-CM | POA: Diagnosis not present

## 2015-08-15 DIAGNOSIS — M542 Cervicalgia: Secondary | ICD-10-CM | POA: Diagnosis not present

## 2015-08-15 DIAGNOSIS — R42 Dizziness and giddiness: Secondary | ICD-10-CM | POA: Diagnosis not present

## 2015-08-15 NOTE — Progress Notes (Signed)
[facility]  PATIENT: [name] DOB: 07/13/1940  REFERRING CLINICIAN:  HISTORY FROM: patient, daughter REASON FOR VISIT: follow up   HISTORICAL  CHIEF COMPLAINT:  Chief Complaint  Patient presents with  . Dizziness    rm 7, dgtr- Cathy  . Follow-up    6 month    HISTORY OF PRESENT ILLNESS:   UPDATE 08/15/15: Since last visit, dizziness is better. Still working with pain mgmt, but planning to switch to new clinic. Having numbness in hands. Having more headaches, pressure, scalp sensitive, no nausea, vomiting, photophobia, phonophobia.   UPDATE 02/21/15: Since last visit, continues to have balance diff and falls. Continues to have memory loss, headaches, neck pain, tremors. Has had multiple ER visits.   UPDATE 12/06/14 (VRP): Since last visit, had more syncope events, falls, now with compression fractures in spine (C7, T2, T3). Now with more neck pain, right ear pain. Seeing Dr. Saintclair Halsted, and has conservative mgmt with c-collar. Had an event in the office where she briefly slumped, and rapidly awoke. Vitals stable.   UPDATE 06/13/14 ([facility]P): Since last visit, still with stress, poor PO intake, another syncope event around christmas 2015 (didn't go to hospital), memory loss, staying cold all the time.   UPDATE 10/19/13 (LL): Since last visit, Home Health PT for gait and balance training; vestibular rehab was ordered and completed. TSH was normal. 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 No acute findings. No change from MRI on 03/30/12. Basically her symptoms have not changed. Daughter lives with her and helps with daily ADLs. Also helps her do vestibular exercises.  UPDATE 06/18/13 (LL): Patient returns for follow up. Daughter requests sooner revisit, patient's dizziness has not improved. She did not get labs drawn that were ordered at last visit (B12, TSH, and HgbA1C). She states her PCP checks her sugar and  B12. Her dizziness is mainly brought on by quick changes in position. She states she has tried vestibular rehab exercises before and it did not help. She denies falls. She has tried Meclizine in the past with no benefit.   UPDATE 05/03/13 (VRP): Patient presents as referral for recurrent dizziness, balance difficulty, passing out. Patient tells me that she is feeling swimmy headed, having spinning sensation with vertigo, lasting a few minutes at a time. This typically triggered by changing position. Sometimes this happens when she is sitting or without movement. Patient is also episodes of passing out, with loss of consciousness for a few minutes. No convulsions. She has prodromal lightheadedness this happens. No post ictal confusion. No tongue biting or incontinence with these episodes. Also significantly noted is poor balance and gait with intermittent falling down. Her depression and anxiety are also poorly controlled at this point. She has significant hallucinations, racing thoughts, poor sleep. Patient's memory is getting worse over time. She does not remember seeing me in October 2013.  PRIOR HPI (03/13/12): 75 year old right-handed female with history of hypertension, hypercholesterolemia, depression, anxiety, here for evaluation of dizziness. For past 6-7 years patient has been having intermittent episodes of room spinning sensation, lasting 45 seconds at a time. No nausea vomiting. Episodes typically triggered when she turns her head too quickly, tilt her head up or down. She denies any slurred speech, trouble talking, trouble swallowing, double vision. Patient had one episode while she was sitting down, without head turning movement, where she felt dizzy, spinning, and then passed out.   REVIEW OF SYSTEMS: Full 14 system review  of systems performed and negative except for as per HPI.    ALLERGIES: Allergies  Allergen Reactions  . Ampicillin Nausea Only and Other (See Comments)    Thrush.   .  Toradol [Ketorolac Tromethamine] Nausea Only and Other (See Comments)    Headaches, back aches  . Ambien [Zolpidem Tartrate] Other (See Comments)    unknown  . Ampicillin-Sulbactam Sodium Other (See Comments)    Muscles draw up tight - severe headache angioedema  . Tape Other (See Comments)    Paper tape only.  . Cephalexin Other (See Comments)    unknown  . Percocet [Oxycodone-Acetaminophen] Itching and Rash    Vicodin is OK, "itches but takes Benadryl"  . Sulfamethoxazole-Trimethoprim Other (See Comments)    unknown  . Tramadol Rash and Other (See Comments)    Headache.     HOME MEDICATIONS: Outpatient Prescriptions Prior to Visit  Medication Sig Dispense Refill  . acetaminophen (TYLENOL) 325 MG tablet Take 2 tablets (650 mg total) by mouth every 6 (six) hours as needed for mild pain (or Fever >/= 101).    Marland Kitchen albuterol (PROVENTIL HFA;VENTOLIN HFA) 108 (90 BASE) MCG/ACT inhaler Inhale 1 puff into the lungs every 6 (six) hours as needed for wheezing or shortness of breath.    Marland Kitchen albuterol (PROVENTIL) (2.5 MG/3ML) 0.083% nebulizer solution Take 2.5 mg by nebulization every 6 (six) hours as needed for wheezing or shortness of breath.    . ALPRAZolam (XANAX) 1 MG tablet Take 1 tablet (1 mg total) by mouth at bedtime as needed for anxiety. 30 tablet 0  . amLODipine (NORVASC) 5 MG tablet Take 5 mg by mouth daily.     Marland Kitchen dicyclomine (BENTYL) 20 MG tablet Take 20 mg by mouth daily as needed for spasms.     . DOCUSATE SODIUM PO Take 1 capsule by mouth daily.    Marland Kitchen esomeprazole (NEXIUM) 40 MG capsule Take 40 mg by mouth daily.     Marland Kitchen FLUoxetine (PROZAC) 20 MG tablet     . gabapentin (NEURONTIN) 400 MG capsule TAKE 1 CAPSULE BY MOUTH FOUR TIMES A DAY 120 capsule 0  . IRON PO Take 1 tablet by mouth every other day.     Marland Kitchen KRILL OIL PO Take 1 tablet by mouth daily.    Marland Kitchen lovastatin (MEVACOR) 10 MG tablet Take 10 mg by mouth daily.    . meclizine (ANTIVERT) 12.5 MG tablet Take 12.5 mg by mouth 3  (three) times daily as needed for nausea.    Marland Kitchen morphine (MSIR) 15 MG tablet Take 1 tablet (15 mg total) by mouth 2 (two) times daily. 60 tablet 0  . polyethylene glycol powder (GLYCOLAX/MIRALAX) powder Take 17 g by mouth daily. For constipation    . senna (SENOKOT) 8.6 MG tablet Take 1-2 tablets by mouth daily.     Marland Kitchen tiZANidine (ZANAFLEX) 2 MG tablet Take 0.5 tablets (1 mg total) by mouth 2 (two) times daily as needed for muscle spasms. 30 tablet 0  . traZODone (DESYREL) 50 MG tablet Take 0.5 tablets (25 mg total) by mouth at bedtime. 30 tablet 6  . vitamin B-12 (CYANOCOBALAMIN) 1000 MCG tablet Take 1,000 mcg by mouth daily.    Marland Kitchen albuterol (ACCUNEB) 0.63 MG/3ML nebulizer solution Take 1 ampule by nebulization every 6 (six) hours as needed for wheezing.    Marland Kitchen HYDROcodone-acetaminophen (NORCO) 7.5-325 MG tablet Take 1 tablet by mouth 2 (two) times daily as needed for moderate pain. 60 tablet 0   No facility-administered medications  prior to visit.    PAST MEDICAL HISTORY: Past Medical History  Diagnosis Date  . Hiatal hernia   . Anemia   . GERD (gastroesophageal reflux disease)   . Hyperlipidemia   . Depression     major  . Stroke [facility])     "mini stroke"  . Osteoarthritis   . Reactive airway disease   . B12 deficiency   . Osteoporosis   . Rupture of bowel ([place])   . Bladder incontinence   . DDD (degenerative disc disease), lumbar   . Cataract   . COPD (chronic obstructive pulmonary disease) ([place])   . Hypertension   . Stroke ([place])   . Macular degeneration   . Blind left eye   . Cervical spine fracture ([place])   . Compression fracture     C7,  upper T spine -compression fx    PAST SURGICAL HISTORY: Past Surgical History  Procedure Laterality Date  . Abdominal surgery    . Colon surgery    . Joint replacement    . Appendectomy    . Bladder surgery    . Rectocele repair    . Knee surgery    . Cholecystectomy    . Abdominal hysterectomy    . Ostomy    . Eye surgery       cataracts    FAMILY HISTORY: Family History  Problem Relation Age of Onset  . Heart failure Mother   . Heart attack Father     SOCIAL HISTORY:  Social History   Social History  . Marital Status: Single    Spouse Name: N/A  . Number of Children: 3  . Years of Education: middle sch   Occupational History  . retired     n/a   Social History Main Topics  . Smoking status: Former Smoker -- 1.50 packs/day for 30 years  . Smokeless tobacco: Not on file     Comment: quit 2013  . Alcohol Use: No  . Drug Use: No  . Sexual Activity: No   Other Topics Concern  . Not on file   Social History Narrative   ** Merged History Encounter **       Patient lives at home with daughter. Caffeine Use: 4 cups daily     PHYSICAL EXAM  Filed Vitals:   08/15/15 1425  BP: 114/71  Pulse: 70  Height: '5\' 4"'$  (1.626 m)  Weight: 183 lb (83.008 kg)    Not recorded      Body mass index is 31.4 kg/(m^2).  MMSE - Mini Mental State Exam 12/06/2014 12/06/2014  Orientation to time - 4  Orientation to Place - 4  Registration - 3  Attention/ Calculation - 0  Recall - 1  Language- name 2 objects - 2  Language- repeat - 0  Language- follow 3 step command - 3  Language- read & follow direction - 1  Write a sentence (No Data) 0  Write a sentence-comments unable to see well enough -  Copy design (No Data) 0  Copy design-comments unable to see well enough -  Total score - 18      GENERAL EXAM: Patient is in no distress; well developed, nourished and groomed  CARDIOVASCULAR: Regular rate and rhythm, no murmurs, no carotid bruits  NEUROLOGIC: MENTAL STATUS: awake, alert, language fluent, comprehension intact, naming intact, fund of knowledge appropriate CRANIAL NERVE: pupils equal and reactive to light, visual fields full to confrontation, extraocular muscles intact, no nystagmus, facial sensation and strength symmetric,  hearing intact, palate elevates symmetrically, uvula midline,  shoulder shrug symmetric, tongue midline. MOTOR: normal bulk and tone, full strength in the BUE, BLE; MILD POSTURAL AND ACTION TREMOR SENSORY: normal and symmetric to light touch, temperature, vibration COORDINATION: finger-nose-finger --> DYSMETRIA REFLEXES: deep tendon reflexes present and symmetric GAIT/STATION: UNSTEADY GAIT    DIAGNOSTIC DATA (LABS, IMAGING, TESTING) - I reviewed patient records, labs, notes, testing and imaging myself where available.  Lab Results  Component Value Date   WBC 6.2 05/23/2015   HGB 13.7 05/23/2015   HCT 42.4 05/23/2015   MCV 91.8 05/23/2015   PLT 210 05/23/2015      Component Value Date/Time   NA 143 05/24/2015 0354   K 4.9 05/24/2015 0354   CL 112* 05/24/2015 0354   CO2 24 05/24/2015 0354   GLUCOSE 101* 05/24/2015 0354   BUN 9 05/24/2015 0354   CREATININE 1.16* 05/24/2015 0354   CALCIUM 9.3 05/24/2015 0354   PROT 6.0* 05/24/2015 0354   ALBUMIN 2.9* 05/24/2015 0354   AST 31 05/24/2015 0354   ALT 25 05/24/2015 0354   ALKPHOS 90 05/24/2015 0354   BILITOT 0.2* 05/24/2015 0354   GFRNONAA 46* 05/24/2015 0354   GFRAA 53* 05/24/2015 0354   No results found for: CHOL Lab Results  Component Value Date   HGBA1C 5.6 05/22/2015   No results found for: VITAMINB12 Lab Results  Component Value Date   TSH 1.273 05/22/2015    03/30/12 MRI BRAIN - 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 There mild changes of chronic microvascular ischemia and generalized cerebral atrophy.  03/30/12 MRA head - mild atheromatous changes in the terminal branch vessels.  03/30/12 MRA neck - normal  06/11/13 MRI brain (without) demonstrating: 1. Few punctate foci of non-specific gliosis in the periventricular and subcortical white matter, likely chronic small vessel ischemic disease. 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 2. No acute findings.  10/08/14 MRI CERVICAL SPINE: Acute moderate C7  compression fracture. No malalignment. 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 Multilevel neural foraminal narrowing: Moderate to severe on the LEFT at C4-5 and C5-6.  10/08/14 MRI THORACIC SPINE: Acute moderate T2 compression fracture. Mild acute T3 compression fracture. No malalignment.  02/20/15 CT head / cervical spine 1. No evidence of intracranial or cervical spine injury. 2. Right frontal scalp laceration without fracture. 3. Chronic left retinal detachment.     ASSESSMENT AND PLAN  75 y.o. year old female here with constellation of issues/symptoms, including gait diff, recurrent falls, dizziness, memory loss, depression, likely related to increased stress, decreased PO intake, polypharmacy, prior strokes, dementia, arthritis, deconditioning and poor judgement/insight.     "Dizziness" (balance difficulty + vertigo + syncope)  Memory loss - MMSE 28/30 (05/03/13)--> 18/30 (12/06/14)  Post-traumatic and cervicogenic headaches  Depression  Anxiety  Poor PO intake  Gastroparesis   Dx:  Dizziness and giddiness  Neck pain  Gait difficulty     PLAN: - advised to focus on gentle / supervised physical activity in daytime, proper sleep at night, small frequent / regular meals,, increased water intake - recommend to follow up with PCP and pain mgmt and psychiatry / psychology  Return if symptoms worsen or fail to improve, for return to PCP.    Penni Bombard, MD 4/81/8563, 1:49 PM Certified in Neurology, Neurophysiology and Neuroimaging  [facility] [address], [place], [place] [address] [phone]

## 2015-08-15 NOTE — Patient Instructions (Signed)
Follow up with pain mgmt in [facility].

## 2015-08-17 ENCOUNTER — Telehealth: Payer: Self-pay

## 2015-08-17 DIAGNOSIS — M5023 Other cervical disc displacement, cervicothoracic region: Secondary | ICD-10-CM | POA: Diagnosis not present

## 2015-08-17 DIAGNOSIS — M4852XD Collapsed vertebra, not elsewhere classified, cervical region, subsequent encounter for fracture with routine healing: Secondary | ICD-10-CM | POA: Diagnosis not present

## 2015-08-17 DIAGNOSIS — M542 Cervicalgia: Secondary | ICD-10-CM | POA: Diagnosis not present

## 2015-08-17 NOTE — Telephone Encounter (Signed)
Would first increase Morphine to TID and if no better in 3 days may increase pm dose to gabapentin 1.5 tablets='600mg'$ 

## 2015-08-17 NOTE — Telephone Encounter (Signed)
Called pt's daughter back, passed on Dr. Letta Pate advice.  She acknowledged.  She asked if it would be okay to up the daily dose of gabapentin?

## 2015-08-17 NOTE — Telephone Encounter (Signed)
Pt's daughter,Cathy, called stating that the Morphine '15mg'$  BID is not relieving any pain for the pt. Daughter states that Vicodin was the best medication so far to relieve some of her pain. Pt would like to know what else she can do. Please advise.

## 2015-08-17 NOTE — Telephone Encounter (Signed)
May try morphine 3 times a day until she gets in with Dr. Consuela Mimes

## 2015-08-18 LAB — TOXASSURE SELECT,+ANTIDEPR,UR: PDF: 0

## 2015-08-18 NOTE — Telephone Encounter (Signed)
Left message with Dr Letta Pate recommendations on Cathy's voicemail per [facility]

## 2015-08-21 NOTE — Progress Notes (Signed)
Urine drug screen for this encounter is consistent for prescribed medication 

## 2015-08-30 DIAGNOSIS — M542 Cervicalgia: Secondary | ICD-10-CM | POA: Diagnosis not present

## 2015-09-05 ENCOUNTER — Ambulatory Visit: Payer: Medicare Other | Admitting: Registered Nurse

## 2015-09-07 DIAGNOSIS — M5023 Other cervical disc displacement, cervicothoracic region: Secondary | ICD-10-CM | POA: Diagnosis not present

## 2015-09-11 ENCOUNTER — Encounter: Payer: Self-pay | Admitting: Physical Medicine & Rehabilitation

## 2015-09-11 ENCOUNTER — Encounter: Payer: Medicare Other | Attending: Physical Medicine & Rehabilitation

## 2015-09-11 ENCOUNTER — Ambulatory Visit (HOSPITAL_BASED_OUTPATIENT_CLINIC_OR_DEPARTMENT_OTHER): Payer: Medicare Other | Admitting: Physical Medicine & Rehabilitation

## 2015-09-11 VITALS — BP 144/75 | HR 65

## 2015-09-11 DIAGNOSIS — Z79891 Long term (current) use of opiate analgesic: Secondary | ICD-10-CM | POA: Insufficient documentation

## 2015-09-11 DIAGNOSIS — D649 Anemia, unspecified: Secondary | ICD-10-CM | POA: Diagnosis not present

## 2015-09-11 DIAGNOSIS — M5136 Other intervertebral disc degeneration, lumbar region: Secondary | ICD-10-CM | POA: Diagnosis not present

## 2015-09-11 DIAGNOSIS — J449 Chronic obstructive pulmonary disease, unspecified: Secondary | ICD-10-CM | POA: Diagnosis not present

## 2015-09-11 DIAGNOSIS — S63502A Unspecified sprain of left wrist, initial encounter: Secondary | ICD-10-CM | POA: Diagnosis not present

## 2015-09-11 DIAGNOSIS — F329 Major depressive disorder, single episode, unspecified: Secondary | ICD-10-CM | POA: Diagnosis not present

## 2015-09-11 DIAGNOSIS — K219 Gastro-esophageal reflux disease without esophagitis: Secondary | ICD-10-CM | POA: Insufficient documentation

## 2015-09-11 DIAGNOSIS — I10 Essential (primary) hypertension: Secondary | ICD-10-CM | POA: Diagnosis not present

## 2015-09-11 DIAGNOSIS — H3530 Unspecified macular degeneration: Secondary | ICD-10-CM | POA: Insufficient documentation

## 2015-09-11 DIAGNOSIS — F17210 Nicotine dependence, cigarettes, uncomplicated: Secondary | ICD-10-CM | POA: Insufficient documentation

## 2015-09-11 DIAGNOSIS — M810 Age-related osteoporosis without current pathological fracture: Secondary | ICD-10-CM | POA: Insufficient documentation

## 2015-09-11 DIAGNOSIS — S22022A Unstable burst fracture of second thoracic vertebra, initial encounter for closed fracture: Secondary | ICD-10-CM | POA: Insufficient documentation

## 2015-09-11 DIAGNOSIS — M797 Fibromyalgia: Secondary | ICD-10-CM | POA: Insufficient documentation

## 2015-09-11 DIAGNOSIS — I639 Cerebral infarction, unspecified: Secondary | ICD-10-CM | POA: Insufficient documentation

## 2015-09-11 DIAGNOSIS — M4802 Spinal stenosis, cervical region: Secondary | ICD-10-CM

## 2015-09-11 DIAGNOSIS — M25512 Pain in left shoulder: Secondary | ICD-10-CM | POA: Insufficient documentation

## 2015-09-11 DIAGNOSIS — S12600A Unspecified displaced fracture of seventh cervical vertebra, initial encounter for closed fracture: Secondary | ICD-10-CM | POA: Diagnosis not present

## 2015-09-11 DIAGNOSIS — M1990 Unspecified osteoarthritis, unspecified site: Secondary | ICD-10-CM | POA: Diagnosis not present

## 2015-09-11 DIAGNOSIS — E785 Hyperlipidemia, unspecified: Secondary | ICD-10-CM | POA: Insufficient documentation

## 2015-09-11 MED ORDER — HYDROCODONE-ACETAMINOPHEN 10-325 MG PO TABS: 1.0000 | ORAL_TABLET | Freq: Two times a day (BID) | ORAL | Status: DC

## 2015-09-11 NOTE — Progress Notes (Signed)
Subjective:    Patient ID: [name], female    DOB: 06/26/1940, 75 y.o.   MRN: [id number] 75 year old female with A fall about one year ago. She had neurosurgical evaluation and workup revealing left C5-C6 and C4-C5 foraminal stenosis as well as an acute compression fracture T2 without evidence of retropulsion. Neurosurgery did not recommend any surgery. She is recommended to have pain management. She has had pain in the scapular stabilizer muscles as well as along the cervical paraspinals. HPI  She's had upper back pain and scapular pain. She's had some partial relief for short per to time with trigger point injections but this only last for a couple days. She is unable to tolerate oxycodone because of itching, she does not get much relief with tramadol. She had some type of vague reaction to [facility] she was on other medications at that time but does not wish to try this again. Did not get much relief with Tylenol with codeine. Has had good relief with hydrocodone at 7.5 mg twice a day but this didn't seem to be quite enough. Pain Inventory Average Pain 9 Pain Right Now 9 My pain is intermittent, constant and aching  In the last 24 hours, has pain interfered with the following? General activity 10 Relation with others 7 Enjoyment of life 10 What TIME of day is your pain at its worst? all Sleep (in general) Fair  Pain is worse with: walking, bending, sitting, inactivity, standing and some activites Pain improves with: heat/ice and medication Relief from Meds: 3  Mobility walk without assistance walk with assistance ability to climb steps?  yes do you drive?  yes  Function disabled: date disabled . I need assistance with the following:  shopping  Neuro/Psych weakness numbness tingling spasms depression  Prior Studies Any changes since last visit?  no  Physicians involved in your care Any changes since last visit?  yes   Family History  Problem  Relation Age of Onset  . Heart failure Mother   . Heart attack Father    Social History   Social History  . Marital Status: Single    Spouse Name: N/A  . Number of Children: 3  . Years of Education: middle sch   Occupational History  . retired     n/a   Social History Main Topics  . Smoking status: Former Smoker -- 1.50 packs/day for 30 years  . Smokeless tobacco: None     Comment: quit 2013  . Alcohol Use: No  . Drug Use: No  . Sexual Activity: No   Other Topics Concern  . None   Social History Narrative   ** Merged History Encounter **       Patient lives at home with daughter. Caffeine Use: 4 cups daily   Past Surgical History  Procedure Laterality Date  . Abdominal surgery    . Colon surgery    . Joint replacement    . Appendectomy    . Bladder surgery    . Rectocele repair    . Knee surgery    . Cholecystectomy    . Abdominal hysterectomy    . Ostomy    . Eye surgery      cataracts   Past Medical History  Diagnosis Date  . Hiatal hernia   . Anemia   . GERD (gastroesophageal reflux disease)   . Hyperlipidemia   . Depression     major  . Stroke [facility])     "mini stroke"  .  Osteoarthritis   . Reactive airway disease   . B12 deficiency   . Osteoporosis   . Rupture of bowel ([place])   . Bladder incontinence   . DDD (degenerative disc disease), lumbar   . Cataract   . COPD (chronic obstructive pulmonary disease) ([place])   . Hypertension   . Stroke ([place])   . Macular degeneration   . Blind left eye   . Cervical spine fracture ([place])   . Compression fracture     C7,  upper T spine -compression fx   BP 144/75 mmHg  Pulse 65  SpO2 96%  Opioid Risk [name]:   Fall Risk [name]:  `1  Depression screen PHQ 2/9  Depression screen PHQ 2/9 03/24/2015  Decreased Interest 2  Down, Depressed, Hopeless 3  PHQ - 2 [name] 5  Altered sleeping 3  Tired, decreased energy 3  Change in appetite 2  Feeling bad or failure about yourself  2  Trouble concentrating  1  Moving slowly or fidgety/restless 0  Suicidal thoughts 0  PHQ-9 [name] 16  Difficult doing work/chores Somewhat difficult     Review of Systems  All other systems reviewed and are negative.      Objective:   Physical Exam  Constitutional: She is oriented to person, place, and time. She appears well-developed and well-nourished.  HENT:  Head: Normocephalic and atraumatic.  Eyes: Conjunctivae and EOM are normal. Pupils are equal, round, and reactive to light.  Neurological: She is alert and oriented to person, place, and time. She has normal strength. Coordination normal.  Psychiatric: She has a normal mood and affect.  Nursing note and vitals reviewed. 5/5 bilateral deltoids, biceps, triceps, grip Gait is without evidence of toe drag or knee instability.        Assessment & Plan:  1. Cervical spinal stenosis, can take hydrocodone without side effect. Will increase to 10 mg twice a day will observe for potential cognitive issues and falls. Of note is the patient's daughters taking the same medication, we will need to keep an eye on pill counts  2. Cervical myofascial pain, patient has tried PT she states it was not helpful. She gets some short-term relief with trigger point injections. She may do well with dry needling.

## 2015-09-11 NOTE — Patient Instructions (Signed)
Please have Dr Saintclair Halsted send visit notes

## 2015-09-14 ENCOUNTER — Other Ambulatory Visit: Payer: Self-pay | Admitting: Neurosurgery

## 2015-09-14 DIAGNOSIS — M5023 Other cervical disc displacement, cervicothoracic region: Secondary | ICD-10-CM

## 2015-09-18 ENCOUNTER — Other Ambulatory Visit: Payer: Self-pay | Admitting: Physical Medicine & Rehabilitation

## 2015-10-06 ENCOUNTER — Ambulatory Visit: Payer: Medicare Other | Admitting: Registered Nurse

## 2015-10-06 ENCOUNTER — Ambulatory Visit: Payer: Medicare Other | Admitting: Physical Medicine & Rehabilitation

## 2015-10-10 ENCOUNTER — Encounter: Payer: Medicare Other | Attending: Physical Medicine & Rehabilitation

## 2015-10-10 ENCOUNTER — Encounter: Payer: Self-pay | Admitting: Physical Medicine & Rehabilitation

## 2015-10-10 ENCOUNTER — Ambulatory Visit (HOSPITAL_BASED_OUTPATIENT_CLINIC_OR_DEPARTMENT_OTHER): Payer: Medicare Other | Admitting: Physical Medicine & Rehabilitation

## 2015-10-10 VITALS — BP 140/82 | HR 53 | Resp 15

## 2015-10-10 DIAGNOSIS — M810 Age-related osteoporosis without current pathological fracture: Secondary | ICD-10-CM | POA: Insufficient documentation

## 2015-10-10 DIAGNOSIS — S22022A Unstable burst fracture of second thoracic vertebra, initial encounter for closed fracture: Secondary | ICD-10-CM | POA: Diagnosis not present

## 2015-10-10 DIAGNOSIS — F17210 Nicotine dependence, cigarettes, uncomplicated: Secondary | ICD-10-CM | POA: Diagnosis not present

## 2015-10-10 DIAGNOSIS — E785 Hyperlipidemia, unspecified: Secondary | ICD-10-CM | POA: Insufficient documentation

## 2015-10-10 DIAGNOSIS — I639 Cerebral infarction, unspecified: Secondary | ICD-10-CM | POA: Insufficient documentation

## 2015-10-10 DIAGNOSIS — M7918 Myalgia, other site: Secondary | ICD-10-CM

## 2015-10-10 DIAGNOSIS — M1990 Unspecified osteoarthritis, unspecified site: Secondary | ICD-10-CM | POA: Diagnosis not present

## 2015-10-10 DIAGNOSIS — K219 Gastro-esophageal reflux disease without esophagitis: Secondary | ICD-10-CM | POA: Diagnosis not present

## 2015-10-10 DIAGNOSIS — M4802 Spinal stenosis, cervical region: Secondary | ICD-10-CM | POA: Diagnosis not present

## 2015-10-10 DIAGNOSIS — H3530 Unspecified macular degeneration: Secondary | ICD-10-CM | POA: Insufficient documentation

## 2015-10-10 DIAGNOSIS — S12600A Unspecified displaced fracture of seventh cervical vertebra, initial encounter for closed fracture: Secondary | ICD-10-CM | POA: Diagnosis not present

## 2015-10-10 DIAGNOSIS — M25512 Pain in left shoulder: Secondary | ICD-10-CM | POA: Diagnosis not present

## 2015-10-10 DIAGNOSIS — M797 Fibromyalgia: Secondary | ICD-10-CM

## 2015-10-10 DIAGNOSIS — I10 Essential (primary) hypertension: Secondary | ICD-10-CM | POA: Insufficient documentation

## 2015-10-10 DIAGNOSIS — J449 Chronic obstructive pulmonary disease, unspecified: Secondary | ICD-10-CM | POA: Diagnosis not present

## 2015-10-10 DIAGNOSIS — S63502A Unspecified sprain of left wrist, initial encounter: Secondary | ICD-10-CM | POA: Insufficient documentation

## 2015-10-10 DIAGNOSIS — F329 Major depressive disorder, single episode, unspecified: Secondary | ICD-10-CM | POA: Insufficient documentation

## 2015-10-10 DIAGNOSIS — D649 Anemia, unspecified: Secondary | ICD-10-CM | POA: Insufficient documentation

## 2015-10-10 DIAGNOSIS — M5136 Other intervertebral disc degeneration, lumbar region: Secondary | ICD-10-CM | POA: Insufficient documentation

## 2015-10-10 DIAGNOSIS — Z79891 Long term (current) use of opiate analgesic: Secondary | ICD-10-CM | POA: Insufficient documentation

## 2015-10-10 MED ORDER — HYDROCODONE-ACETAMINOPHEN 10-325 MG PO TABS: 1.0000 | ORAL_TABLET | Freq: Two times a day (BID) | ORAL | Status: DC

## 2015-10-10 NOTE — Progress Notes (Signed)
Subjective:    Patient ID: [name], female    DOB: [birth year], 75 y.o.   MRN: [id number] 75 year old female with history of C7 as well as T2 compression fractures without neurologic deficits. She has a chief complaint of left-sided neck pain today. She gets some relief by using heat to that area.  She has no significant weakness in her arms.. She is also using a cream to that area. HPI Patient continues have left-sided neck pain as well as left arm numbness. She has followed up with neurosurgery and they are planning a cervical epidural injection. She is also followed up with neurology. She has complaints of dizziness. She has followed with neurology since 2014 for similar complaints. Patient has had some temporary relief with trigger point injections. She gets partial relief with hydrocodone 10 mg twice a day. Patient has had physical therapy already, patient states was not helpful. Has tried TENS unit, her daughter has one at home. This has not been helpful.  Past surgical history bilateral knee replacements   Pain Inventory Average Pain 8 Pain Right Now 8 My pain is burning, stabbing, tingling and aching  In the last 24 hours, has pain interfered with the following? General activity 5 Relation with others 6 Enjoyment of life 8 What TIME of day is your pain at its worst? Morning, Afternoon, Evening, Night Sleep (in general) Fair  Pain is worse with: walking, bending, sitting, inactivity and standing Pain improves with: medication Relief from Meds: 6  Mobility walk without assistance how many minutes can you walk? 5-10 ability to climb steps?  no do you drive?  no Do you have any goals in this area?  yes  Function Do you have any goals in this area?  no  Neuro/Psych No problems in this area  Prior Studies Any changes since last visit?  no  Physicians involved in your care Any changes since last visit?  no   Family History  Problem Relation Age of Onset    . Heart failure Mother   . Heart attack Father    Social History   Social History  . Marital Status: Single    Spouse Name: N/A  . Number of Children: 3  . Years of Education: middle sch   Occupational History  . retired     n/a   Social History Main Topics  . Smoking status: Former Smoker -- 1.50 packs/day for 30 years  . Smokeless tobacco: None     Comment: quit 2013  . Alcohol Use: No  . Drug Use: No  . Sexual Activity: No   Other Topics Concern  . None   Social History Narrative   ** Merged History Encounter **       Patient lives at home with daughter. Caffeine Use: 4 cups daily   Past Surgical History  Procedure Laterality Date  . Abdominal surgery    . Colon surgery    . Joint replacement    . Appendectomy    . Bladder surgery    . Rectocele repair    . Knee surgery    . Cholecystectomy    . Abdominal hysterectomy    . Ostomy    . Eye surgery      cataracts   Past Medical History  Diagnosis Date  . Hiatal hernia   . Anemia   . GERD (gastroesophageal reflux disease)   . Hyperlipidemia   . Depression     major  . Stroke [facility])     "  mini stroke"  . Osteoarthritis   . Reactive airway disease   . B12 deficiency   . Osteoporosis   . Rupture of bowel ([place])   . Bladder incontinence   . DDD (degenerative disc disease), lumbar   . Cataract   . COPD (chronic obstructive pulmonary disease) ([place])   . Hypertension   . Stroke ([place])   . Macular degeneration   . Blind left eye   . Cervical spine fracture ([place])   . Compression fracture     C7,  upper T spine -compression fx   BP 140/82 mmHg  Pulse 53  Resp 15  SpO2 96%  Opioid Risk [name]:   Fall Risk [name]:  `1  Depression screen PHQ 2/9  Depression screen PHQ 2/9 03/24/2015  Decreased Interest 2  Down, Depressed, Hopeless 3  PHQ - 2 [name] 5  Altered sleeping 3  Tired, decreased energy 3  Change in appetite 2  Feeling bad or failure about yourself  2  Trouble concentrating 1  Moving  slowly or fidgety/restless 0  Suicidal thoughts 0  PHQ-9 [name] 16  Difficult doing work/chores Somewhat difficult      Review of Systems  All other systems reviewed and are negative.      Objective:   Physical Exam  Constitutional: She is oriented to person, place, and time. She appears well-developed and well-nourished.  HENT:  Head: Normocephalic and atraumatic.  Eyes: Conjunctivae and EOM are normal. Pupils are equal, round, and reactive to light.  Neck: Normal range of motion.  Musculoskeletal: Normal range of motion.  Neurological: She is alert and oriented to person, place, and time. She has normal strength. She displays no atrophy. No sensory deficit.  Reflex Scores:      Tricep reflexes are 2+ on the right side and 2+ on the left side.      Bicep reflexes are 2+ on the right side and 2+ on the left side.      Brachioradialis reflexes are 2+ on the right side and 2+ on the left side.      Patellar reflexes are 2+ on the right side and 2+ on the left side.      Achilles reflexes are 2+ on the right side and 2+ on the left side. Ambulates without evidence of ataxia, no loss of balance, no wide base  Psychiatric: She has a normal mood and affect.  Nursing note and vitals reviewed.   Motor strength is 5/5 bilateral deltoids, biceps, triceps, grip, hip flexor, knee extensor, ankle dorsiflexion plantar flexor She has no evidence of atrophy in the hands. Neck range of motion is 50% flexion and extension, lateral bending and rotation Midline pain around  C7. No significant trapezius pain today.     Assessment & Plan:  1. Chronic neck pain this is multifactorial she has cervical spinal stenosis as well as cervical myofascial pain. Her myofascial pain is really not the issue today. I do not think repeat trigger point injections are necessary. I do think she may benefit from the cervical epidural injection but may also benefit from cervical medial branch blocks  Continue  hydrocodone 10/325 1 tablet twice a day Return to clinic one month with nurse practitioner follow-up  2. Dizziness likely multifactorial we discussed medications, this preceded her hydrocodone prescription.We'll follow-up with neurology  3. Cervical radiculopathy likely from her left C5-C6 stenosis, Follow-up with neurosurgery

## 2015-10-10 NOTE — Patient Instructions (Signed)
Please have Dr. Saintclair Halsted send injection notes

## 2015-10-24 ENCOUNTER — Other Ambulatory Visit: Payer: Self-pay | Admitting: Physical Medicine & Rehabilitation

## 2015-10-26 DIAGNOSIS — Z96651 Presence of right artificial knee joint: Secondary | ICD-10-CM | POA: Diagnosis not present

## 2015-10-26 DIAGNOSIS — M545 Low back pain, unspecified: Secondary | ICD-10-CM | POA: Insufficient documentation

## 2015-10-26 DIAGNOSIS — G8929 Other chronic pain: Secondary | ICD-10-CM | POA: Insufficient documentation

## 2015-10-26 DIAGNOSIS — M25552 Pain in left hip: Secondary | ICD-10-CM | POA: Diagnosis not present

## 2015-10-26 DIAGNOSIS — Z471 Aftercare following joint replacement surgery: Secondary | ICD-10-CM | POA: Diagnosis not present

## 2015-10-26 DIAGNOSIS — M25551 Pain in right hip: Secondary | ICD-10-CM | POA: Diagnosis not present

## 2015-11-03 ENCOUNTER — Other Ambulatory Visit: Payer: Self-pay | Admitting: Nurse Practitioner

## 2015-11-03 DIAGNOSIS — R1314 Dysphagia, pharyngoesophageal phase: Secondary | ICD-10-CM | POA: Diagnosis not present

## 2015-11-03 DIAGNOSIS — R1310 Dysphagia, unspecified: Secondary | ICD-10-CM

## 2015-11-08 ENCOUNTER — Other Ambulatory Visit: Payer: Medicare Other

## 2015-11-13 DIAGNOSIS — R350 Frequency of micturition: Secondary | ICD-10-CM | POA: Diagnosis not present

## 2015-11-13 DIAGNOSIS — J449 Chronic obstructive pulmonary disease, unspecified: Secondary | ICD-10-CM | POA: Diagnosis not present

## 2015-11-13 DIAGNOSIS — F322 Major depressive disorder, single episode, severe without psychotic features: Secondary | ICD-10-CM | POA: Diagnosis not present

## 2015-11-13 DIAGNOSIS — R05 Cough: Secondary | ICD-10-CM | POA: Diagnosis not present

## 2015-11-13 DIAGNOSIS — J029 Acute pharyngitis, unspecified: Secondary | ICD-10-CM | POA: Diagnosis not present

## 2015-11-14 ENCOUNTER — Encounter: Payer: Self-pay | Admitting: Registered Nurse

## 2015-11-14 ENCOUNTER — Encounter: Payer: Medicare Other | Attending: Physical Medicine & Rehabilitation | Admitting: Registered Nurse

## 2015-11-14 VITALS — BP 134/88 | HR 80

## 2015-11-14 DIAGNOSIS — I639 Cerebral infarction, unspecified: Secondary | ICD-10-CM | POA: Diagnosis not present

## 2015-11-14 DIAGNOSIS — M797 Fibromyalgia: Secondary | ICD-10-CM | POA: Diagnosis not present

## 2015-11-14 DIAGNOSIS — D649 Anemia, unspecified: Secondary | ICD-10-CM | POA: Insufficient documentation

## 2015-11-14 DIAGNOSIS — M5412 Radiculopathy, cervical region: Secondary | ICD-10-CM

## 2015-11-14 DIAGNOSIS — M810 Age-related osteoporosis without current pathological fracture: Secondary | ICD-10-CM | POA: Diagnosis not present

## 2015-11-14 DIAGNOSIS — H3530 Unspecified macular degeneration: Secondary | ICD-10-CM | POA: Diagnosis not present

## 2015-11-14 DIAGNOSIS — I10 Essential (primary) hypertension: Secondary | ICD-10-CM | POA: Diagnosis not present

## 2015-11-14 DIAGNOSIS — M5136 Other intervertebral disc degeneration, lumbar region: Secondary | ICD-10-CM | POA: Diagnosis not present

## 2015-11-14 DIAGNOSIS — E785 Hyperlipidemia, unspecified: Secondary | ICD-10-CM | POA: Diagnosis not present

## 2015-11-14 DIAGNOSIS — G894 Chronic pain syndrome: Secondary | ICD-10-CM | POA: Diagnosis not present

## 2015-11-14 DIAGNOSIS — M4802 Spinal stenosis, cervical region: Secondary | ICD-10-CM

## 2015-11-14 DIAGNOSIS — F17210 Nicotine dependence, cigarettes, uncomplicated: Secondary | ICD-10-CM | POA: Diagnosis not present

## 2015-11-14 DIAGNOSIS — K219 Gastro-esophageal reflux disease without esophagitis: Secondary | ICD-10-CM | POA: Diagnosis not present

## 2015-11-14 DIAGNOSIS — M1990 Unspecified osteoarthritis, unspecified site: Secondary | ICD-10-CM | POA: Insufficient documentation

## 2015-11-14 DIAGNOSIS — S12600A Unspecified displaced fracture of seventh cervical vertebra, initial encounter for closed fracture: Secondary | ICD-10-CM | POA: Diagnosis not present

## 2015-11-14 DIAGNOSIS — S63502A Unspecified sprain of left wrist, initial encounter: Secondary | ICD-10-CM | POA: Insufficient documentation

## 2015-11-14 DIAGNOSIS — Z5181 Encounter for therapeutic drug level monitoring: Secondary | ICD-10-CM

## 2015-11-14 DIAGNOSIS — J449 Chronic obstructive pulmonary disease, unspecified: Secondary | ICD-10-CM | POA: Insufficient documentation

## 2015-11-14 DIAGNOSIS — F329 Major depressive disorder, single episode, unspecified: Secondary | ICD-10-CM | POA: Insufficient documentation

## 2015-11-14 DIAGNOSIS — M7918 Myalgia, other site: Secondary | ICD-10-CM

## 2015-11-14 DIAGNOSIS — M25512 Pain in left shoulder: Secondary | ICD-10-CM | POA: Diagnosis not present

## 2015-11-14 DIAGNOSIS — S22022A Unstable burst fracture of second thoracic vertebra, initial encounter for closed fracture: Secondary | ICD-10-CM | POA: Diagnosis not present

## 2015-11-14 DIAGNOSIS — Z79891 Long term (current) use of opiate analgesic: Secondary | ICD-10-CM | POA: Diagnosis not present

## 2015-11-14 DIAGNOSIS — Z79899 Other long term (current) drug therapy: Secondary | ICD-10-CM | POA: Diagnosis not present

## 2015-11-14 MED ORDER — HYDROCODONE-ACETAMINOPHEN 10-325 MG PO TABS: 1.0000 | ORAL_TABLET | Freq: Two times a day (BID) | ORAL | Status: DC

## 2015-11-14 NOTE — Progress Notes (Signed)
Subjective:    Patient ID: [name], female    DOB: Jan 09, 1941, 75 y.o.   MRN: [id number]  HPI: [name] is a 75 year old female who returns for follow up appointment and medication refill. She states her pain is located in her neck, left shoulder and mid-back. She rates her pain 9. Her current exercise regime is walking and light gardening.   Also noticed her hydrocodone tablet was broken in half, asked her to take her medication as prescribed. She states she follows the prescription.  She verbalizes understanding.   Pain Inventory Average Pain 8 Pain Right Now 9 My pain is intermittent, constant, burning, dull and aching  In the last 24 hours, has pain interfered with the following? General activity 8 Relation with others 7 Enjoyment of life 7 What TIME of day is your pain at its worst? all Sleep (in general) Fair  Pain is worse with: walking, bending, sitting, inactivity and standing Pain improves with: medication Relief from Meds: 5  Mobility Do you have any goals in this area?  no  Function Do you have any goals in this area?  no  Neuro/Psych No problems in this area  Prior Studies Any changes since last visit?  no  Physicians involved in your care Any changes since last visit?  no   Family History  Problem Relation Age of Onset  . Heart failure Mother   . Heart attack Father    Social History   Social History  . Marital Status: Single    Spouse Name: N/A  . Number of Children: 3  . Years of Education: middle sch   Occupational History  . retired     n/a   Social History Main Topics  . Smoking status: Former Smoker -- 1.50 packs/day for 30 years  . Smokeless tobacco: None     Comment: quit 2013  . Alcohol Use: No  . Drug Use: No  . Sexual Activity: No   Other Topics Concern  . None   Social History Narrative   ** Merged History Encounter **       Patient lives at home with daughter. Caffeine Use: 4 cups daily    Past Surgical History  Procedure Laterality Date  . Abdominal surgery    . Colon surgery    . Joint replacement    . Appendectomy    . Bladder surgery    . Rectocele repair    . Knee surgery    . Cholecystectomy    . Abdominal hysterectomy    . Ostomy    . Eye surgery      cataracts   Past Medical History  Diagnosis Date  . Hiatal hernia   . Anemia   . GERD (gastroesophageal reflux disease)   . Hyperlipidemia   . Depression     major  . Stroke [facility])     "mini stroke"  . Osteoarthritis   . Reactive airway disease   . B12 deficiency   . Osteoporosis   . Rupture of bowel ([place])   . Bladder incontinence   . DDD (degenerative disc disease), lumbar   . Cataract   . COPD (chronic obstructive pulmonary disease) ([place])   . Hypertension   . Stroke ([place])   . Macular degeneration   . Blind left eye   . Cervical spine fracture ([place])   . Compression fracture     C7,  upper T spine -compression fx   BP 134/88 mmHg  Pulse 80  SpO2 95%  Opioid Risk [name]:   Fall Risk [name]:  `1  Depression screen PHQ 2/9  Depression screen PHQ 2/9 03/24/2015  Decreased Interest 2  Down, Depressed, Hopeless 3  PHQ - 2 [name] 5  Altered sleeping 3  Tired, decreased energy 3  Change in appetite 2  Feeling bad or failure about yourself  2  Trouble concentrating 1  Moving slowly or fidgety/restless 0  Suicidal thoughts 0  PHQ-9 [name] 16  Difficult doing work/chores Somewhat difficult     Review of Systems  Constitutional: Negative for activity change.  HENT: Negative for congestion.   Eyes: Negative for discharge.  Respiratory: Negative for apnea.   Cardiovascular: Negative for chest pain.  Gastrointestinal: Negative for abdominal distention.  Endocrine: Negative for cold intolerance.  Genitourinary: Negative for difficulty urinating.  Musculoskeletal: Negative for arthralgias.  Skin: Negative for color change.  Allergic/Immunologic: Negative for environmental allergies.   Neurological: Negative for dizziness.  Hematological: Negative for adenopathy.  Psychiatric/Behavioral: Negative for agitation.  All other systems reviewed and are negative.      Objective:   Physical Exam  Constitutional: She is oriented to person, place, and time. She appears well-developed and well-nourished.  HENT:  Head: Normocephalic and atraumatic.  Neck: Normal range of motion. Neck supple.  Cardiovascular: Normal rate and regular rhythm.   Pulmonary/Chest: Effort normal and breath sounds normal.  Musculoskeletal:  Normal Muscle Bulk and Muscle Testing Reveals: Upper Extremities: Full ROM and Muscle Strength 5/5 Thoracic Paraspinal Tenderness: T-7- T-9 Lumbar Paraspinal Tenderness: L-3- L-5 Lower Extremities: Full ROM and Muscle Strength 5/5 Arises from chair slowly Narrow Based Gait  Neurological: She is alert and oriented to person, place, and time.  Skin: Skin is warm and dry.  Psychiatric: She has a normal mood and affect.  Nursing note and vitals reviewed.         Assessment & Plan:  1. Complex multifactorial pain left side neck and Left shoulder.3 main etiologies: A. myofascial pain left trapezius, left infraspinatus B. Cervical spinal stenosis with left C4-C5 radiculopathy C. Cervical spondylosis with facet arthropathy Refilled: Hydrocodone 10/325 mg one tablet twice a day as needed for moderate pain #60.  We will continue the opioid monitoring program, this consists of regular clinic visits, examinations, urine drug screen, pill counts as well as use of [place] Controlled Substance Reporting System. 2. Cervical Radiculopathy: Continue Gabapentin  20 minutes of face to face patient care time was spent during this visit. All questions were encouraged and answered.

## 2015-11-17 ENCOUNTER — Ambulatory Visit: Payer: Medicare Other | Admitting: Anesthesiology

## 2015-11-20 DIAGNOSIS — M5023 Other cervical disc displacement, cervicothoracic region: Secondary | ICD-10-CM | POA: Diagnosis not present

## 2015-11-22 LAB — TOXASSURE SELECT,+ANTIDEPR,UR: PDF: 0

## 2015-11-23 ENCOUNTER — Other Ambulatory Visit: Payer: Self-pay | Admitting: Physical Medicine & Rehabilitation

## 2015-11-24 ENCOUNTER — Telehealth: Payer: Self-pay

## 2015-11-24 NOTE — Telephone Encounter (Signed)
[place], [name] was prescribed Citalopram by Dr. Lysle Rubens.

## 2015-11-24 NOTE — Telephone Encounter (Signed)
Pt's UDS declared a positive level of citalopram. However, this is not on her med list. Please advise.

## 2015-12-13 DIAGNOSIS — F322 Major depressive disorder, single episode, severe without psychotic features: Secondary | ICD-10-CM | POA: Diagnosis not present

## 2015-12-13 DIAGNOSIS — G8929 Other chronic pain: Secondary | ICD-10-CM | POA: Diagnosis not present

## 2015-12-14 ENCOUNTER — Encounter: Payer: Medicare Other | Admitting: Registered Nurse

## 2015-12-14 ENCOUNTER — Ambulatory Visit: Payer: Medicare Other

## 2015-12-19 ENCOUNTER — Encounter: Payer: Medicare Other | Attending: Physical Medicine & Rehabilitation | Admitting: Registered Nurse

## 2015-12-19 ENCOUNTER — Encounter: Payer: Self-pay | Admitting: Registered Nurse

## 2015-12-19 VITALS — BP 137/76 | HR 63 | Temp 98.0°F

## 2015-12-19 DIAGNOSIS — M797 Fibromyalgia: Secondary | ICD-10-CM | POA: Insufficient documentation

## 2015-12-19 DIAGNOSIS — D649 Anemia, unspecified: Secondary | ICD-10-CM | POA: Insufficient documentation

## 2015-12-19 DIAGNOSIS — H3530 Unspecified macular degeneration: Secondary | ICD-10-CM | POA: Diagnosis not present

## 2015-12-19 DIAGNOSIS — S12600A Unspecified displaced fracture of seventh cervical vertebra, initial encounter for closed fracture: Secondary | ICD-10-CM | POA: Insufficient documentation

## 2015-12-19 DIAGNOSIS — M25512 Pain in left shoulder: Secondary | ICD-10-CM | POA: Diagnosis not present

## 2015-12-19 DIAGNOSIS — J449 Chronic obstructive pulmonary disease, unspecified: Secondary | ICD-10-CM | POA: Diagnosis not present

## 2015-12-19 DIAGNOSIS — G894 Chronic pain syndrome: Secondary | ICD-10-CM

## 2015-12-19 DIAGNOSIS — M545 Low back pain, unspecified: Secondary | ICD-10-CM

## 2015-12-19 DIAGNOSIS — I639 Cerebral infarction, unspecified: Secondary | ICD-10-CM | POA: Insufficient documentation

## 2015-12-19 DIAGNOSIS — M1990 Unspecified osteoarthritis, unspecified site: Secondary | ICD-10-CM | POA: Insufficient documentation

## 2015-12-19 DIAGNOSIS — K219 Gastro-esophageal reflux disease without esophagitis: Secondary | ICD-10-CM | POA: Diagnosis not present

## 2015-12-19 DIAGNOSIS — E785 Hyperlipidemia, unspecified: Secondary | ICD-10-CM | POA: Insufficient documentation

## 2015-12-19 DIAGNOSIS — M5412 Radiculopathy, cervical region: Secondary | ICD-10-CM | POA: Diagnosis not present

## 2015-12-19 DIAGNOSIS — F17210 Nicotine dependence, cigarettes, uncomplicated: Secondary | ICD-10-CM | POA: Diagnosis not present

## 2015-12-19 DIAGNOSIS — I10 Essential (primary) hypertension: Secondary | ICD-10-CM | POA: Insufficient documentation

## 2015-12-19 DIAGNOSIS — Z5181 Encounter for therapeutic drug level monitoring: Secondary | ICD-10-CM

## 2015-12-19 DIAGNOSIS — S63502A Unspecified sprain of left wrist, initial encounter: Secondary | ICD-10-CM | POA: Diagnosis not present

## 2015-12-19 DIAGNOSIS — M4802 Spinal stenosis, cervical region: Secondary | ICD-10-CM

## 2015-12-19 DIAGNOSIS — M810 Age-related osteoporosis without current pathological fracture: Secondary | ICD-10-CM | POA: Diagnosis not present

## 2015-12-19 DIAGNOSIS — F329 Major depressive disorder, single episode, unspecified: Secondary | ICD-10-CM | POA: Insufficient documentation

## 2015-12-19 DIAGNOSIS — M7918 Myalgia, other site: Secondary | ICD-10-CM

## 2015-12-19 DIAGNOSIS — M5136 Other intervertebral disc degeneration, lumbar region: Secondary | ICD-10-CM | POA: Insufficient documentation

## 2015-12-19 DIAGNOSIS — Z79891 Long term (current) use of opiate analgesic: Secondary | ICD-10-CM | POA: Insufficient documentation

## 2015-12-19 DIAGNOSIS — Z79899 Other long term (current) drug therapy: Secondary | ICD-10-CM

## 2015-12-19 DIAGNOSIS — S22022A Unstable burst fracture of second thoracic vertebra, initial encounter for closed fracture: Secondary | ICD-10-CM | POA: Diagnosis not present

## 2015-12-19 MED ORDER — HYDROCODONE-ACETAMINOPHEN 10-325 MG PO TABS: 1.0000 | ORAL_TABLET | Freq: Two times a day (BID) | ORAL | 0 refills | Status: DC

## 2015-12-19 NOTE — Progress Notes (Signed)
Subjective:    Patient ID: [name], female    DOB: [birth year], 75 y.o.   MRN: [id number]  HPI: [name] is a 75 year old female who returns for follow up appointment and medication refill. She states her pain is located in her neck and lower-back. She rates her pain 8. Her current exercise regime is walking and performing chair exercises.  Pain Inventory Average Pain 8 Pain Right Now 8 My pain is intermittent, constant, tingling and aching  In the last 24 hours, has pain interfered with the following? General activity 8 Relation with others 6 Enjoyment of life 8 What TIME of day is your pain at its worst? all Sleep (in general) NA  Pain is worse with: some activites Pain improves with: medication Relief from Meds: 6  Mobility Do you have any goals in this area?  no  Function Do you have any goals in this area?  no  Neuro/Psych No problems in this area  Prior Studies Any changes since last visit?  no  Physicians involved in your care Any changes since last visit?  no   Family History  Problem Relation Age of Onset  . Heart failure Mother   . Heart attack Father    Social History   Social History  . Marital status: Single    Spouse name: N/A  . Number of children: 3  . Years of education: middle sch   Occupational History  . retired     n/a   Social History Main Topics  . Smoking status: Former Smoker    Packs/day: 1.50    Years: 30.00  . Smokeless tobacco: Never Used     Comment: quit 2013  . Alcohol use No  . Drug use: No  . Sexual activity: No   Other Topics Concern  . None   Social History Narrative   ** Merged History Encounter **       Patient lives at home with daughter. Caffeine Use: 4 cups daily   Past Surgical History:  Procedure Laterality Date  . ABDOMINAL HYSTERECTOMY    . ABDOMINAL SURGERY    . APPENDECTOMY    . BLADDER SURGERY    . CHOLECYSTECTOMY    . COLON SURGERY    . EYE SURGERY     cataracts    . JOINT REPLACEMENT    . KNEE SURGERY    . OSTOMY    . RECTOCELE REPAIR     Past Medical History:  Diagnosis Date  . Anemia   . B12 deficiency   . Bladder incontinence   . Blind left eye   . Cataract   . Cervical spine fracture ([place])   . Compression fracture    C7,  upper T spine -compression fx  . COPD (chronic obstructive pulmonary disease) ([place])   . DDD (degenerative disc disease), lumbar   . Depression    major  . GERD (gastroesophageal reflux disease)   . Hiatal hernia   . Hyperlipidemia   . Hypertension   . Macular degeneration   . Osteoarthritis   . Osteoporosis   . Reactive airway disease   . Rupture of bowel ([place])   . Stroke [facility])    "mini stroke"  . Stroke ([place])    BP 137/76 (BP Location: Left Arm, Patient Position: Sitting, Cuff Size: Normal)   Pulse 63   Temp 98 F (36.7 C) (Oral)   SpO2 94%   Opioid Risk [name]:   Fall Risk  [name]:  `1  Depression screen PHQ 2/9  Depression screen PHQ 2/9 03/24/2015  Decreased Interest 2  Down, Depressed, Hopeless 3  PHQ - 2 [name] 5  Altered sleeping 3  Tired, decreased energy 3  Change in appetite 2  Feeling bad or failure about yourself  2  Trouble concentrating 1  Moving slowly or fidgety/restless 0  Suicidal thoughts 0  PHQ-9 [name] 16  Difficult doing work/chores Somewhat difficult   Review of Systems  All other systems reviewed and are negative.      Objective:   Physical Exam  Constitutional: She is oriented to person, place, and time. She appears well-developed and well-nourished.  HENT:  Head: Normocephalic and atraumatic.  Neck: Normal range of motion. Neck supple.  Cervical Paraspinal Tenderness: C-5- C-6  Cardiovascular: Normal rate and regular rhythm.   Pulmonary/Chest: Effort normal and breath sounds normal.  Musculoskeletal:  Normal Muscle Bulk and Muscle Testing Reveals: Upper Extremities: Full ROM and Muscle Strength 5/5 Bilateral AC Joint Tenderness Thoracic Paraspinal  Tenderness: T-1- T-3 Lumbar Paraspinal Tenderness: L-3- L-5 Lower Extremities: Full ROM and Muscle Strength 5/5 Arises from chair with ease Narrow based Gait   Neurological: She is alert and oriented to person, place, and time.  Skin: Skin is warm and dry.  Psychiatric: She has a normal mood and affect.  Nursing note and vitals reviewed.         Assessment & Plan:  1. Complex multifactorial pain left side neck and Left shoulder.3 main etiologies: A. myofascial pain left trapezius, left infraspinatus B. Cervical spinal stenosis with left C4-C5 radiculopathy C. Cervical spondylosis with facet arthropathy Refilled: Hydrocodone 10/325 mg one tablet twice a day as needed for moderate pain #60.  We will continue the opioid monitoring program, this consists of regular clinic visits, examinations, urine drug screen, pill counts as well as use of [place] Controlled Substance Reporting System. 2. Cervical Radiculopathy: Continue Gabapentin  20 minutes of face to face patient care time was spent during this visit. All questions were encouraged and answered.

## 2015-12-20 ENCOUNTER — Other Ambulatory Visit: Payer: Self-pay | Admitting: Internal Medicine

## 2015-12-20 DIAGNOSIS — Z1231 Encounter for screening mammogram for malignant neoplasm of breast: Secondary | ICD-10-CM

## 2015-12-31 IMAGING — MR MR LUMBAR SPINE W/O CM
5 series · 44 of 48 positions shown · non-contrast
Comparison: CT 10/17/2012.  Radiography 09/17/2012.

CLINICAL DATA: Low back pain, 4 years duration. Symptoms are
worsening since a fall in a retail store 1 year ago. Pain in
weakness in both legs.

EXAM:
MRI LUMBAR SPINE WITHOUT CONTRAST
TECHNIQUE: Multiplanar, multisequence MR imaging of the lumbar spine was
performed. No intravenous contrast was administered.

[Series 3: T2 · sagittal · 4.0mm · 0.88mm/px · 6 of 13 slices shown (1 of 2)]
[im 1/13]
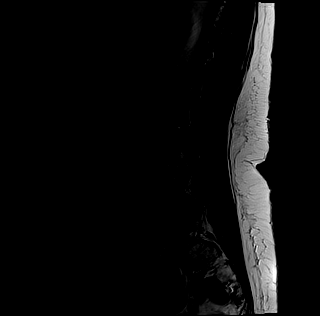
[im 3/13]
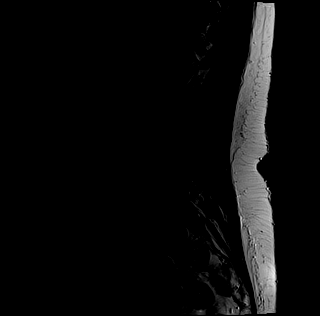
[im 5/13]
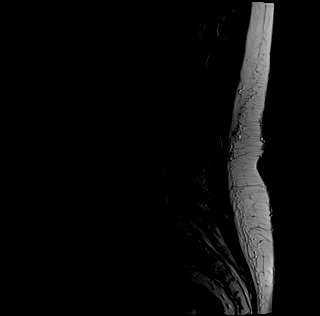
[im 8/13]
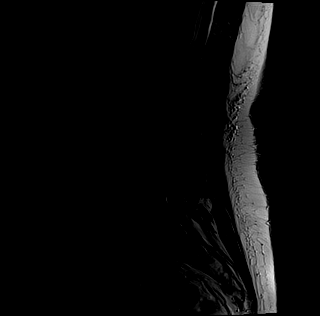
[im 10/13]
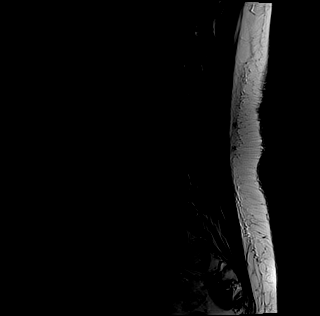
[im 13/13]
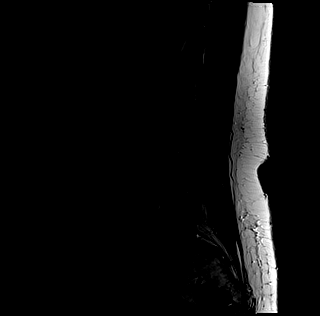

[Series 4: tirm sag · sagittal · 4.0mm · 0.55mm/px · 6 of 13 slices shown]
[im 1/13]
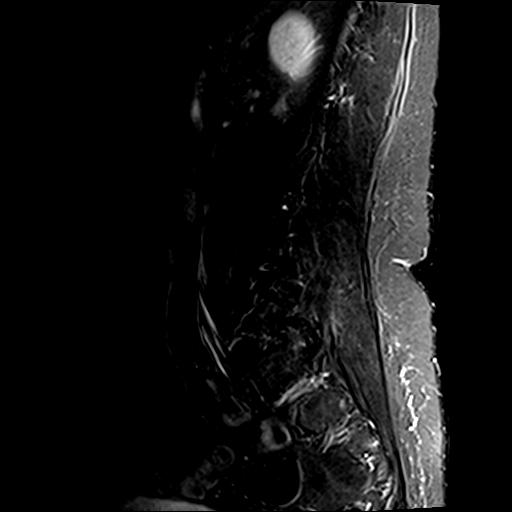
[im 3/13]
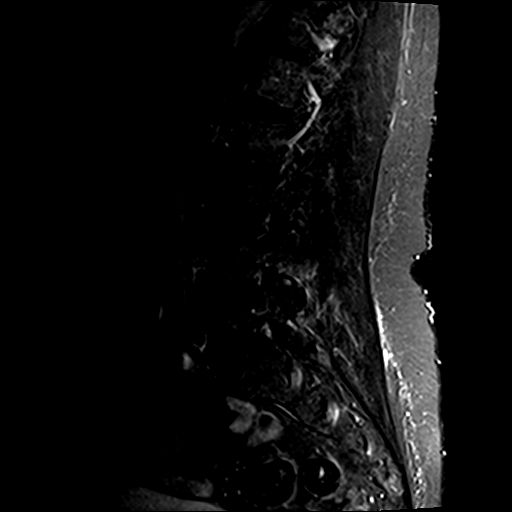
[im 5/13]
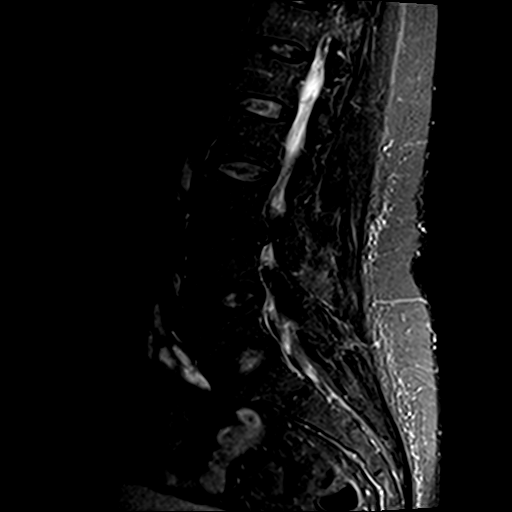
[im 8/13]
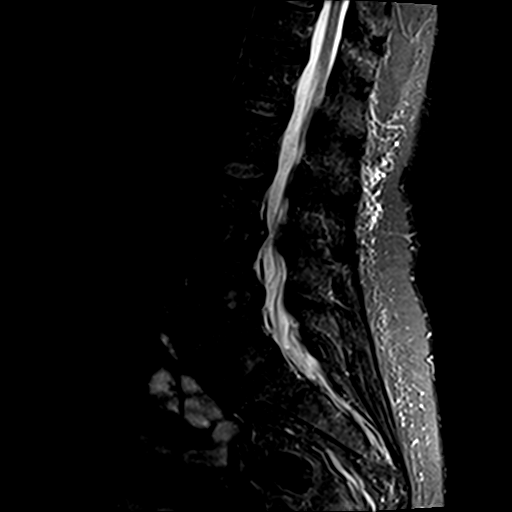
[im 10/13]
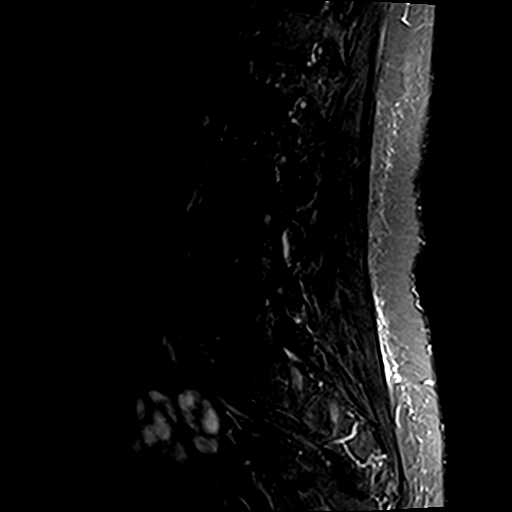
[im 13/13]
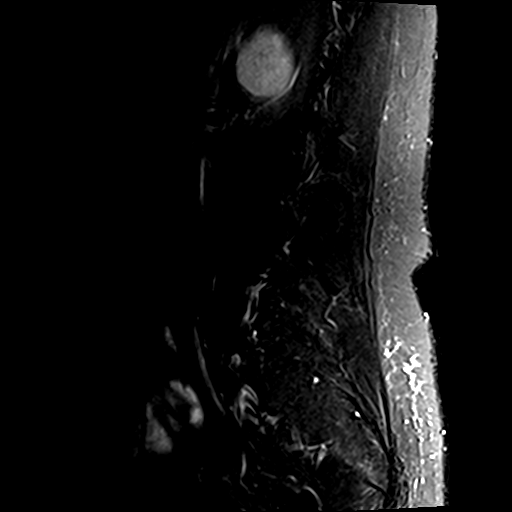

[Series 5: T1 · sagittal · 4.0mm · 0.88mm/px · 6 of 13 slices shown (1 of 2)]
[im 1/13]
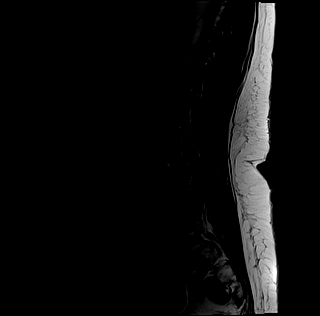
[im 3/13]
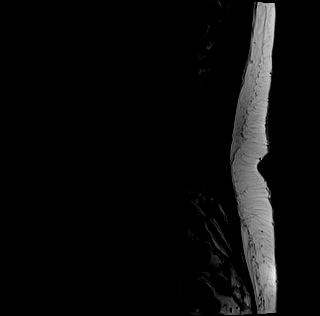
[im 5/13]
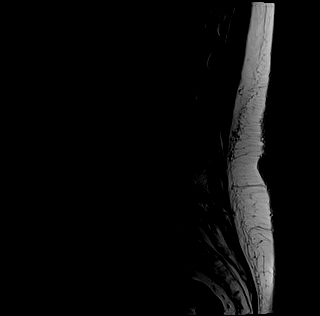
[im 8/13]
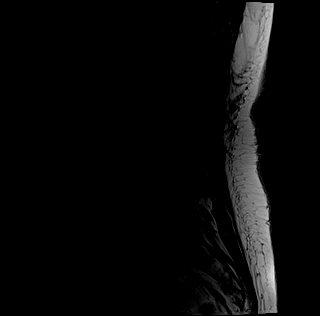
[im 10/13]
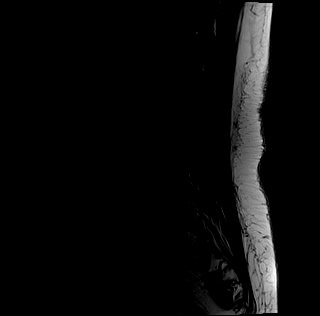
[im 13/13]
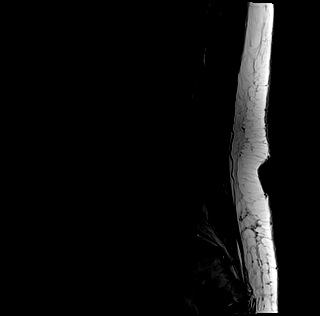

[Series 6: T1 · axial · 4.0mm · 0.78mm/px · z∈[-20,+161]mm · 11 of 33 slices shown (2 of 2)]
[im 1/33]
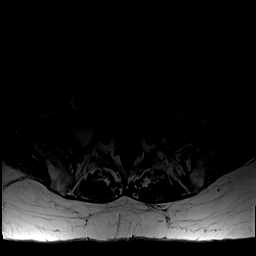
[im 3/33]
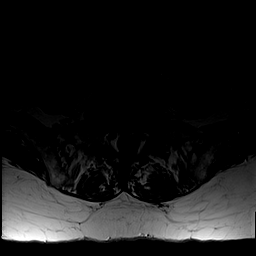
[im 5/33]
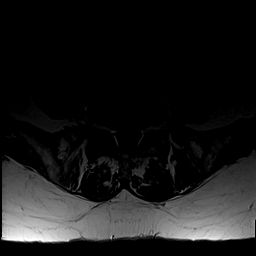
[im 7/33]
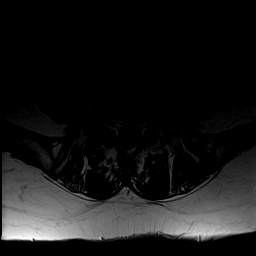
[im 10/33]
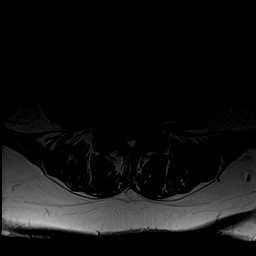
[im 14/33]
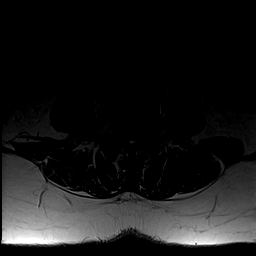
[im 17/33]
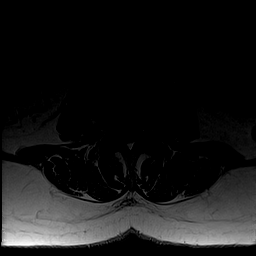
[im 19/33]
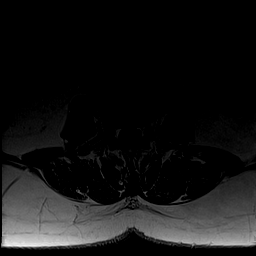
[im 23/33]
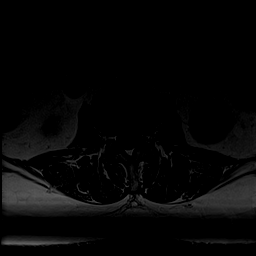
[im 28/33]
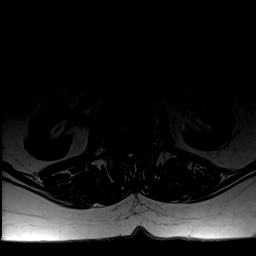
[im 33/33]
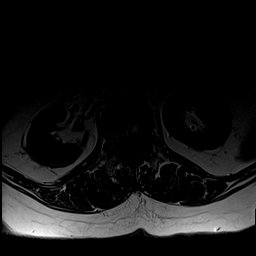

[Series 7: T2 · axial · 4.0mm · 0.78mm/px · z∈[-20,+161]mm · 15 of 33 slices shown (2 of 2)]
[im 1/33]
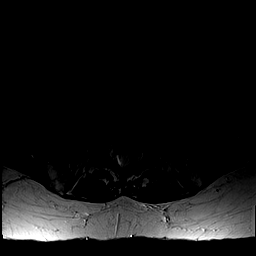
[im 3/33]
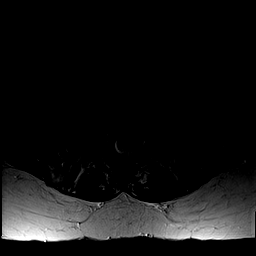
[im 5/33]
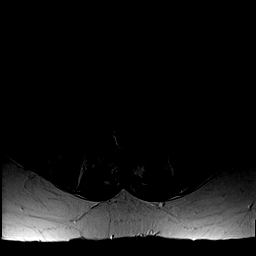
[im 7/33]
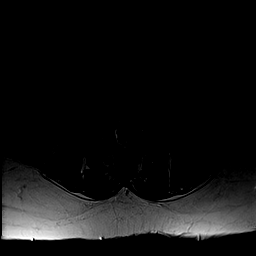
[im 10/33]
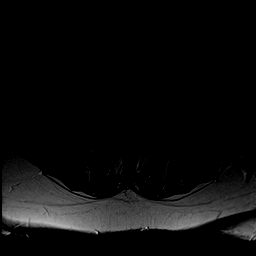
[im 12/33]
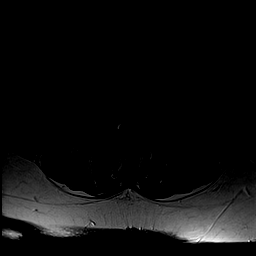
[im 14/33]
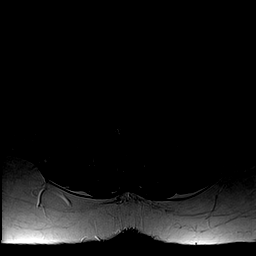
[im 17/33]
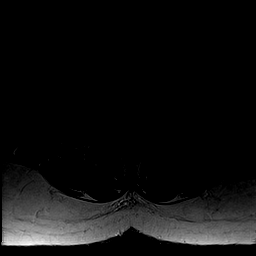
[im 19/33]
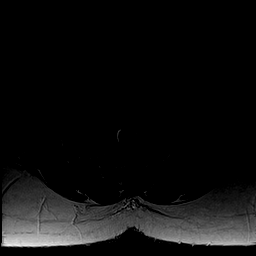
[im 21/33]
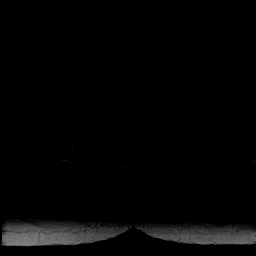
[im 23/33]
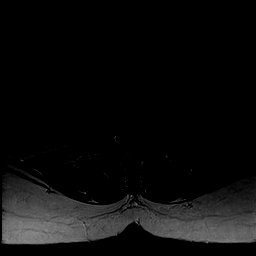
[im 26/33]
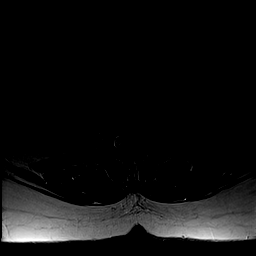
[im 28/33]
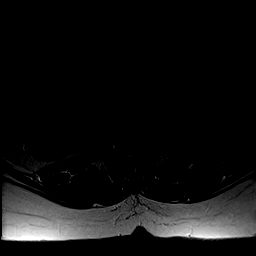
[im 30/33]
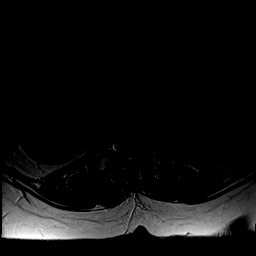
[im 33/33]
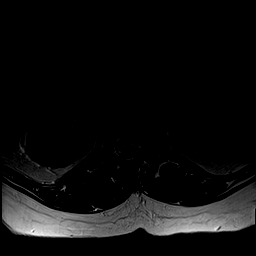

[44 of 48 positions shown; findings below may reference images not displayed]

FINDINGS: There are minimal, non-compressive disc bulges at T12-L1, L1-2 and
L2-3. No stenosis. The distal cord and conus are normal with the
conus tip at upper L2.

L3-4: Disc degeneration with shallow broad-based protrusion of disc
material. Bilateral facet degeneration with facet and ligamentous
hypertrophy. Stenosis of both lateral recesses and foramina, right
more than left. Neural compression could occur at this level.
Additionally, based on the facet arthropathy, there could be motion
with flexion and extension.

L4-5: Mild bulging of the disc. Mild bilateral facet arthropathy. No
compressive narrowing of the canal or foramina.

L5-S1: Mild bulging of the disc. Mild facet degeneration. No
stenosis.
IMPRESSION: The significant abnormalities are probably at the L3-4 level. There
is bilateral facet arthropathy which could be a cause of back pain
or referred facet syndrome pain. There is shallow broad-based
protrusion of disc material. There is stenosis of the lateral
recesses and neural foramina right more than left. Neural
compression could occur at this level. Additionally, there could be
worsening of this pattern with flexion extension.

## 2016-01-02 ENCOUNTER — Ambulatory Visit
Admission: RE | Admit: 2016-01-02 | Discharge: 2016-01-02 | Disposition: A | Discharge status: Home / Self Care | Payer: Medicare Other | Source: Ambulatory Visit | Attending: Internal Medicine | Admitting: Internal Medicine

## 2016-01-02 DIAGNOSIS — Z1231 Encounter for screening mammogram for malignant neoplasm of breast: Secondary | ICD-10-CM | POA: Diagnosis not present

## 2016-01-10 DIAGNOSIS — E7800 Pure hypercholesterolemia, unspecified: Secondary | ICD-10-CM | POA: Diagnosis not present

## 2016-01-10 DIAGNOSIS — J449 Chronic obstructive pulmonary disease, unspecified: Secondary | ICD-10-CM | POA: Diagnosis not present

## 2016-01-10 DIAGNOSIS — G8929 Other chronic pain: Secondary | ICD-10-CM | POA: Diagnosis not present

## 2016-01-10 DIAGNOSIS — Z1389 Encounter for screening for other disorder: Secondary | ICD-10-CM | POA: Diagnosis not present

## 2016-01-10 DIAGNOSIS — Z0000 Encounter for general adult medical examination without abnormal findings: Secondary | ICD-10-CM | POA: Diagnosis not present

## 2016-01-10 DIAGNOSIS — I10 Essential (primary) hypertension: Secondary | ICD-10-CM | POA: Diagnosis not present

## 2016-01-10 DIAGNOSIS — E538 Deficiency of other specified B group vitamins: Secondary | ICD-10-CM | POA: Diagnosis not present

## 2016-01-10 DIAGNOSIS — R1314 Dysphagia, pharyngoesophageal phase: Secondary | ICD-10-CM | POA: Diagnosis not present

## 2016-01-10 DIAGNOSIS — M519 Unspecified thoracic, thoracolumbar and lumbosacral intervertebral disc disorder: Secondary | ICD-10-CM | POA: Diagnosis not present

## 2016-01-10 DIAGNOSIS — K219 Gastro-esophageal reflux disease without esophagitis: Secondary | ICD-10-CM | POA: Diagnosis not present

## 2016-01-10 DIAGNOSIS — F324 Major depressive disorder, single episode, in partial remission: Secondary | ICD-10-CM | POA: Diagnosis not present

## 2016-01-10 DIAGNOSIS — M810 Age-related osteoporosis without current pathological fracture: Secondary | ICD-10-CM | POA: Diagnosis not present

## 2016-01-10 DIAGNOSIS — N183 Chronic kidney disease, stage 3 (moderate): Secondary | ICD-10-CM | POA: Diagnosis not present

## 2016-01-12 DIAGNOSIS — R1314 Dysphagia, pharyngoesophageal phase: Secondary | ICD-10-CM | POA: Diagnosis not present

## 2016-01-12 DIAGNOSIS — Z8739 Personal history of other diseases of the musculoskeletal system and connective tissue: Secondary | ICD-10-CM | POA: Diagnosis not present

## 2016-01-12 DIAGNOSIS — R682 Dry mouth, unspecified: Secondary | ICD-10-CM | POA: Diagnosis not present

## 2016-01-12 DIAGNOSIS — K219 Gastro-esophageal reflux disease without esophagitis: Secondary | ICD-10-CM | POA: Diagnosis not present

## 2016-01-12 DIAGNOSIS — R490 Dysphonia: Secondary | ICD-10-CM | POA: Diagnosis not present

## 2016-01-22 ENCOUNTER — Encounter: Payer: Medicare Other | Admitting: Registered Nurse

## 2016-01-23 ENCOUNTER — Encounter (HOSPITAL_COMMUNITY): Payer: Self-pay

## 2016-01-23 ENCOUNTER — Emergency Department (HOSPITAL_COMMUNITY): Payer: Medicare Other

## 2016-01-23 ENCOUNTER — Emergency Department (HOSPITAL_COMMUNITY)
Admission: EM | Admit: 2016-01-23 | Discharge: 2016-01-23 | Disposition: A | Discharge status: Home / Self Care | Payer: Medicare Other | Attending: Emergency Medicine | Admitting: Emergency Medicine

## 2016-01-23 DIAGNOSIS — Z79899 Other long term (current) drug therapy: Secondary | ICD-10-CM | POA: Diagnosis not present

## 2016-01-23 DIAGNOSIS — R0789 Other chest pain: Secondary | ICD-10-CM | POA: Diagnosis not present

## 2016-01-23 DIAGNOSIS — L821 Other seborrheic keratosis: Secondary | ICD-10-CM | POA: Diagnosis not present

## 2016-01-23 DIAGNOSIS — J449 Chronic obstructive pulmonary disease, unspecified: Secondary | ICD-10-CM | POA: Insufficient documentation

## 2016-01-23 DIAGNOSIS — D2272 Melanocytic nevi of left lower limb, including hip: Secondary | ICD-10-CM | POA: Diagnosis not present

## 2016-01-23 DIAGNOSIS — I10 Essential (primary) hypertension: Secondary | ICD-10-CM | POA: Diagnosis not present

## 2016-01-23 DIAGNOSIS — D2261 Melanocytic nevi of right upper limb, including shoulder: Secondary | ICD-10-CM | POA: Diagnosis not present

## 2016-01-23 DIAGNOSIS — Z87891 Personal history of nicotine dependence: Secondary | ICD-10-CM | POA: Insufficient documentation

## 2016-01-23 DIAGNOSIS — R0602 Shortness of breath: Secondary | ICD-10-CM | POA: Diagnosis not present

## 2016-01-23 DIAGNOSIS — R0600 Dyspnea, unspecified: Secondary | ICD-10-CM

## 2016-01-23 DIAGNOSIS — R079 Chest pain, unspecified: Secondary | ICD-10-CM | POA: Diagnosis not present

## 2016-01-23 DIAGNOSIS — Z9689 Presence of other specified functional implants: Secondary | ICD-10-CM | POA: Diagnosis not present

## 2016-01-23 DIAGNOSIS — Z8673 Personal history of transient ischemic attack (TIA), and cerebral infarction without residual deficits: Secondary | ICD-10-CM | POA: Diagnosis not present

## 2016-01-23 DIAGNOSIS — D225 Melanocytic nevi of trunk: Secondary | ICD-10-CM | POA: Diagnosis not present

## 2016-01-23 LAB — BRAIN NATRIURETIC PEPTIDE: B Natriuretic Peptide: 60.2 pg/mL (ref 0.0–100.0)

## 2016-01-23 LAB — BASIC METABOLIC PANEL
Anion gap: 5 (ref 5–15)
BUN: 8 mg/dL (ref 6–20)
CO2: 26 mmol/L (ref 22–32)
Calcium: 9.3 mg/dL (ref 8.9–10.3)
Chloride: 108 mmol/L (ref 101–111)
Creatinine, Ser: 1.04 mg/dL — ABNORMAL HIGH (ref 0.44–1.00)
GFR calc Af Amer: 60 mL/min — ABNORMAL LOW (ref >60)
GFR calc non Af Amer: 52 mL/min — ABNORMAL LOW (ref >60)
Glucose, Bld: 92 mg/dL (ref 65–99)
Potassium: 3.1 mmol/L — ABNORMAL LOW (ref 3.5–5.1)
Sodium: 139 mmol/L (ref 135–145)

## 2016-01-23 LAB — I-STAT TROPONIN, ED: Troponin i, poc: 0 ng/mL (ref 0.00–0.08)

## 2016-01-23 LAB — CBC
HCT: 43.6 % (ref 36.0–46.0)
Hemoglobin: 14 g/dL (ref 12.0–15.0)
MCH: 29.4 pg (ref 26.0–34.0)
MCHC: 32.1 g/dL (ref 30.0–36.0)
MCV: 91.4 fL (ref 78.0–100.0)
Platelets: 164 10*3/uL (ref 150–400)
RBC: 4.77 MIL/uL (ref 3.87–5.11)
RDW: 13.3 % (ref 11.5–15.5)
WBC: 5.5 10*3/uL (ref 4.0–10.5)

## 2016-01-23 MED ORDER — DEXAMETHASONE SODIUM PHOSPHATE 10 MG/ML IJ SOLN
10.0000 mg | Freq: Once | INTRAMUSCULAR | Status: AC
  Administered 2016-01-23: 10 mg via INTRAVENOUS
  Filled 2016-01-23: qty 1

## 2016-01-23 MED ORDER — HYDROMORPHONE HCL 1 MG/ML IJ SOLN
0.5000 mg | Freq: Once | INTRAMUSCULAR | Status: AC
  Administered 2016-01-23: 0.5 mg via INTRAVENOUS
  Filled 2016-01-23: qty 1

## 2016-01-23 MED ORDER — AZITHROMYCIN 250 MG PO TABS: 250.0000 mg | ORAL_TABLET | Freq: Every day | ORAL | 0 refills | Status: DC

## 2016-01-23 MED ORDER — PREDNISONE 20 MG PO TABS: 40.0000 mg | ORAL_TABLET | Freq: Every day | ORAL | 0 refills | Status: DC

## 2016-01-23 MED ORDER — IPRATROPIUM-ALBUTEROL 0.5-2.5 (3) MG/3ML IN SOLN
3.0000 mL | Freq: Once | RESPIRATORY_TRACT | Status: AC
  Administered 2016-01-23: 3 mL via RESPIRATORY_TRACT
  Filled 2016-01-23: qty 3

## 2016-01-23 MED ORDER — POTASSIUM CHLORIDE CRYS ER 20 MEQ PO TBCR
40.0000 meq | EXTENDED_RELEASE_TABLET | Freq: Once | ORAL | Status: AC
  Administered 2016-01-23: 40 meq via ORAL
  Filled 2016-01-23: qty 2

## 2016-01-23 NOTE — ED Notes (Signed)
Pt requesting pain medication. EDP aware

## 2016-01-23 NOTE — ED Triage Notes (Signed)
Pt from home by [facility] for Head ache and substernal chest pain. The cp has been going on for the last 3hrs and HA for the last 3days on and off.

## 2016-01-23 NOTE — ED Provider Notes (Signed)
[facility] DEPT Provider Note   CSN: [id number] Arrival date & time: 01/23/16  1942  By signing my name below, I, Reola Mosher, attest that this documentation has been prepared under the direction and in the presence of Virgel Manifold, MD. Electronically Signed: Reola Mosher, ED Scribe. 01/23/16. 8:43 PM.  History   Chief Complaint Chief Complaint  Patient presents with  . Chest Pain   The history is provided by the patient and a relative. No language interpreter was used.   HPI Comments: [name] is a 75 y.o. female BIB EMS, with a PMHx significant of anemia, COPD (on at-home nebulizer, albuterol, and Ventolin), DDD, GERD, HLD, TIA, HTN, and diffuse myofacial pain syndrome, who presents to the Emergency Department complaining of gradual onset, unchanged, constant SOB onset ~3 hours ago. Pt reports associated chest pain described as tightness, productive cough with green sputum, dizziness, and headache secondary to her SOB. She notes that she has intermittent episodes of SOB at baseline due to her hx of COPD, but states that her SOB today is worse. Pt used a dosage of her albuterol inhaler prior to coming into the ED with minimal relief of her symptoms. Her SOB is exacerbated with laying flat. Pt does not have a hx of CHF, but has a FHx of CHF. Pt quit smoking cigarettes ~5 years ago (previously smoked for ~30 years), but still uses an Clinical cytogeneticist daily. Her daughter reports that she has had a URI for the past week, and has been around the patient frequently prior to the onset of her symptoms today. Denies fever, leg swelling, or any other associated symptoms.   Past Medical History:  Diagnosis Date  . Anemia   . B12 deficiency   . Bladder incontinence   . Blind left eye   . Cataract   . Cervical spine fracture ([place])   . Compression fracture    C7,  upper T spine -compression fx  . COPD (chronic obstructive pulmonary disease) ([place])   . DDD  (degenerative disc disease), lumbar   . Depression    major  . GERD (gastroesophageal reflux disease)   . Hiatal hernia   . Hyperlipidemia   . Hypertension   . Macular degeneration   . Osteoarthritis   . Osteoporosis   . Reactive airway disease   . Rupture of bowel ([place])   . Stroke [facility])    "mini stroke"  . Stroke [facility])    Patient Active Problem List   Diagnosis Date Noted  . Spinal stenosis in cervical region 06/27/2015  . Syncope 05/22/2015  . Diffuse myofascial pain syndrome 03/24/2015  . Sepsis ([place]) 10/08/2014  . Bacteremia 10/07/2014  . Leukopenia 10/07/2014  . Thrombocytopenia ([place]) 10/07/2014  . Fever 10/05/2014  . Hypoxia 10/05/2014  . HLD (hyperlipidemia) 07/30/2013  . HTN (hypertension) 07/30/2013  . GERD (gastroesophageal reflux disease) 07/30/2013  . Chest pain 07/29/2013  . Dizziness and giddiness 06/18/2013  . Gait difficulty 05/03/2013  . Memory loss 05/03/2013   Past Surgical History:  Procedure Laterality Date  . ABDOMINAL HYSTERECTOMY    . ABDOMINAL SURGERY    . APPENDECTOMY    . BLADDER SURGERY    . CHOLECYSTECTOMY    . COLON SURGERY    . EYE SURGERY     cataracts  . JOINT REPLACEMENT    . KNEE SURGERY    . OSTOMY    . RECTOCELE REPAIR     OB History    Gravida Para Term Preterm  AB Living   0 0 0 0 0     SAB TAB Ectopic Multiple Live Births   0 0 0         Home Medications    Prior to Admission medications   Medication Sig Start Date End Date Taking? Authorizing Provider  acetaminophen (TYLENOL) 325 MG tablet Take 2 tablets (650 mg total) by mouth every 6 (six) hours as needed for mild pain (or Fever >/= 101). 05/24/15   Geradine Girt, DO  albuterol (PROVENTIL HFA;VENTOLIN HFA) 108 (90 BASE) MCG/ACT inhaler Inhale 1 puff into the lungs every 6 (six) hours as needed for wheezing or shortness of breath.    Historical Provider, MD  albuterol (PROVENTIL) (2.5 MG/3ML) 0.083% nebulizer solution Take 2.5 mg by nebulization every 6 (six)  hours as needed for wheezing or shortness of breath.    Historical Provider, MD  ALPRAZolam Duanne Moron) 1 MG tablet Take 1 tablet (1 mg total) by mouth at bedtime as needed for anxiety. 05/24/15   Geradine Girt, DO  amLODipine (NORVASC) 5 MG tablet Take 5 mg by mouth daily.  04/06/13   Historical Provider, MD  dicyclomine (BENTYL) 20 MG tablet Take 20 mg by mouth daily as needed for spasms.     Historical Provider, MD  DOCUSATE SODIUM PO Take 1 capsule by mouth daily.    Historical Provider, MD  esomeprazole (NEXIUM) 40 MG capsule Take 40 mg by mouth daily.     Historical Provider, MD  FLUoxetine (PROZAC) 20 MG tablet  07/12/15   Historical Provider, MD  gabapentin (NEURONTIN) 400 MG capsule TAKE 1 CAPSULE BY MOUTH FOUR TIMES A DAY 11/24/15   Charlett Blake, MD  HYDROcodone-acetaminophen [facility]) 10-325 MG tablet Take 1 tablet by mouth 2 (two) times daily. 12/19/15   Bayard Hugger, NP  IRON PO Take 1 tablet by mouth every other day.     Historical Provider, MD  KRILL OIL PO Take 1 tablet by mouth daily.    Historical Provider, MD  lovastatin (MEVACOR) 10 MG tablet Take 10 mg by mouth daily. 09/22/14   Historical Provider, MD  meclizine (ANTIVERT) 12.5 MG tablet Take 12.5 mg by mouth 3 (three) times daily as needed for nausea.    Historical Provider, MD  polyethylene glycol powder (GLYCOLAX/MIRALAX) powder Take 17 g by mouth daily. For constipation    Historical Provider, MD  senna (SENOKOT) 8.6 MG tablet Take 1-2 tablets by mouth daily.     Historical Provider, MD  traZODone (DESYREL) 50 MG tablet Take 0.5 tablets (25 mg total) by mouth at bedtime. 12/30/14   Penni Bombard, MD  vitamin B-12 (CYANOCOBALAMIN) 1000 MCG tablet Take 1,000 mcg by mouth daily.    Historical Provider, MD   Family History Family History  Problem Relation Age of Onset  . Heart failure Mother   . Heart attack Father    Social History Social History  Substance Use Topics  . Smoking status: Former Smoker     Packs/day: 1.50    Years: 30.00  . Smokeless tobacco: Never Used     Comment: quit 2013  . Alcohol use No   Allergies   Ampicillin; Toradol [ketorolac tromethamine]; Ambien [zolpidem tartrate]; Ampicillin-sulbactam sodium; Tape; Cephalexin; Ketorolac; Percocet [oxycodone-acetaminophen]; Sulfamethoxazole-trimethoprim; and Tramadol  Review of Systems Review of Systems  Constitutional: Negative for fever.  Respiratory: Positive for cough (productive), chest tightness and shortness of breath.   Cardiovascular: Positive for chest pain. Negative for leg swelling.  Neurological: Positive  for dizziness and headaches.  All other systems reviewed and are negative.  Physical Exam Updated Vital Signs BP 113/74   Pulse 64   Temp 97.5 F (36.4 C) (Oral)   Resp 24   Ht '5\' 5"'$  (1.651 m)   Wt 187 lb (84.8 kg)   SpO2 99%   BMI 31.12 kg/m   Physical Exam  Constitutional: She appears well-developed and well-nourished. No distress.  HENT:  Head: Normocephalic and atraumatic.  Mouth/Throat: Oropharynx is clear and moist. No oropharyngeal exudate.  Eyes: Conjunctivae and EOM are normal. Pupils are equal, round, and reactive to light. Right eye exhibits no discharge. Left eye exhibits no discharge. No scleral icterus.  Neck: Normal range of motion. Neck supple. No JVD present. No tracheal deviation present. No thyromegaly present.  Cardiovascular: Normal rate, regular rhythm, normal heart sounds and intact distal pulses.  Exam reveals no gallop and no friction rub.   No murmur heard. Pulmonary/Chest: Effort normal. No respiratory distress. She has no wheezes. She has rales.  Rales noted in bilateral bases.  Abdominal: Soft. Bowel sounds are normal. She exhibits no distension and no mass. There is no tenderness.  Musculoskeletal: Normal range of motion. She exhibits no edema or tenderness.  Lymphadenopathy:    She has no cervical adenopathy.  Neurological: She is alert. Coordination normal.    Skin: Skin is warm and dry. No rash noted. No erythema.  Psychiatric: She has a normal mood and affect. Her behavior is normal.  Nursing note and vitals reviewed.  ED Treatments / Results  DIAGNOSTIC STUDIES: Oxygen Saturation is 99% on RA, normal by my interpretation.   COORDINATION OF CARE: 8:42 PM-Discussed next steps with pt. Pt verbalized understanding and is agreeable with the plan.   Labs (all labs ordered are listed, but only abnormal results are displayed) Labs Reviewed  BASIC METABOLIC PANEL - Abnormal; Notable for the following:       Result Value   Potassium 3.1 (*)    Creatinine, Ser 1.04 (*)    GFR calc non Af Amer 52 (*)    GFR calc Af Amer 60 (*)    All other components within normal limits  CBC  BRAIN NATRIURETIC PEPTIDE  I-STAT TROPOININ, ED   EKG  EKG Interpretation  Date/Time:  Tuesday January 23 2016 19:52:06 EDT Ventricular Rate:  69 PR Interval:    QRS Duration: 96 QT Interval:  388 QTC Calculation: 416 R Axis:   61 Text Interpretation:  Sinus rhythm No significant change since last tracing Confirmed by [place]  MD, Deary ([id number]) on 01/23/2016 8:52:21 PM      Radiology No results found.   Dg Chest 2 View  Result Date: 01/23/2016 CLINICAL DATA:  Intermittent midsternal chest pain for 2-3 weeks, worsened today after a period of lightheadedness. EXAM: CHEST  2 VIEW COMPARISON:  05/22/2015 FINDINGS: There is mild interstitial coarsening in both bases, chronic. No airspace opacities. No effusions. Normal pulmonary vasculature. Borderline heart size, unchanged. Unremarkable hilar and mediastinal contours, unchanged. IMPRESSION: No active cardiopulmonary disease. Electronically Signed   By: Andreas Newport M.D.   On: 01/23/2016 21:35    Procedures Procedures (including critical care time)  Medications Ordered in ED Medications  ipratropium-albuterol (DUONEB) 0.5-2.5 (3) MG/3ML nebulizer solution 3 mL (3 mLs Nebulization Given 01/23/16 2121)   HYDROmorphone (DILAUDID) injection 0.5 mg (0.5 mg Intravenous Given 01/23/16 2215)  potassium chloride SA (K-DUR,KLOR-CON) CR tablet 40 mEq (40 mEq Oral Given 01/23/16 2212)  dexamethasone (DECADRON) injection 10 mg (  10 mg Intravenous Given 01/23/16 2214)    Initial Impression / Assessment and Plan / ED Course  I have reviewed the triage vital signs and the nursing notes.  Pertinent labs & imaging results that were available during my care of the patient were reviewed by me and considered in my medical decision making (see chart for details).  Clinical Course    75 year old female with dyspnea. Suspect this is a COPD exacerbation. Symptoms improved with a nap. She started on steroids. Continued steroids as an outpatient. I feel she is appropriate for further outpatient treatment.  Final Clinical Impressions(s) / ED Diagnoses   Final diagnoses:  Dyspnea    New Prescriptions Discharge Medication List as of 01/23/2016 11:14 PM    START taking these medications   Details  predniSONE (DELTASONE) 20 MG tablet Take 2 tablets (40 mg total) by mouth daily., Starting Tue 01/23/2016, Print         I personally preformed the services scribed in my presence. The recorded information has been reviewed is accurate. Virgel Manifold, MD.     Virgel Manifold, MD 02/04/16 [phone]

## 2016-01-23 NOTE — ED Notes (Signed)
spoke with main lab for add on bnp

## 2016-01-24 ENCOUNTER — Telehealth: Payer: Self-pay

## 2016-01-24 MED ORDER — HYDROCODONE-ACETAMINOPHEN 10-325 MG PO TABS
1.0000 | ORAL_TABLET | Freq: Two times a day (BID) | ORAL | 0 refills | Status: DC
Start: 2016-01-24 — End: 2016-02-12

## 2016-01-24 NOTE — Telephone Encounter (Signed)
Pt's daughter-Cathy- called to cancel her appt on 01/22/16 due to illness. [name] called back today stating that she is out of her Hydrocodone. According to the [facility]- she got it filled on 12/27/15. Danella Sensing will refill meds today to pick up.

## 2016-01-31 DIAGNOSIS — R110 Nausea: Secondary | ICD-10-CM | POA: Diagnosis not present

## 2016-02-05 ENCOUNTER — Ambulatory Visit: Payer: Medicare Other | Admitting: Registered Nurse

## 2016-02-05 DIAGNOSIS — H52221 Regular astigmatism, right eye: Secondary | ICD-10-CM | POA: Diagnosis not present

## 2016-02-05 DIAGNOSIS — H3530 Unspecified macular degeneration: Secondary | ICD-10-CM | POA: Diagnosis not present

## 2016-02-05 DIAGNOSIS — H15833 Staphyloma posticum, bilateral: Secondary | ICD-10-CM | POA: Diagnosis not present

## 2016-02-05 DIAGNOSIS — H5201 Hypermetropia, right eye: Secondary | ICD-10-CM | POA: Diagnosis not present

## 2016-02-05 DIAGNOSIS — Z961 Presence of intraocular lens: Secondary | ICD-10-CM | POA: Diagnosis not present

## 2016-02-05 DIAGNOSIS — H25812 Combined forms of age-related cataract, left eye: Secondary | ICD-10-CM | POA: Diagnosis not present

## 2016-02-07 ENCOUNTER — Other Ambulatory Visit: Payer: Self-pay | Admitting: Otolaryngology

## 2016-02-07 DIAGNOSIS — R1310 Dysphagia, unspecified: Secondary | ICD-10-CM

## 2016-02-08 ENCOUNTER — Inpatient Hospital Stay: Admission: RE | Admit: 2016-02-08 | Payer: Medicare Other | Source: Ambulatory Visit

## 2016-02-12 ENCOUNTER — Encounter: Payer: Self-pay | Admitting: Registered Nurse

## 2016-02-12 ENCOUNTER — Encounter: Payer: Medicare Other | Attending: Physical Medicine & Rehabilitation | Admitting: Registered Nurse

## 2016-02-12 VITALS — BP 142/88 | HR 63

## 2016-02-12 DIAGNOSIS — M810 Age-related osteoporosis without current pathological fracture: Secondary | ICD-10-CM | POA: Insufficient documentation

## 2016-02-12 DIAGNOSIS — H3530 Unspecified macular degeneration: Secondary | ICD-10-CM | POA: Diagnosis not present

## 2016-02-12 DIAGNOSIS — S63502A Unspecified sprain of left wrist, initial encounter: Secondary | ICD-10-CM | POA: Insufficient documentation

## 2016-02-12 DIAGNOSIS — G894 Chronic pain syndrome: Secondary | ICD-10-CM

## 2016-02-12 DIAGNOSIS — M1990 Unspecified osteoarthritis, unspecified site: Secondary | ICD-10-CM | POA: Diagnosis not present

## 2016-02-12 DIAGNOSIS — J449 Chronic obstructive pulmonary disease, unspecified: Secondary | ICD-10-CM | POA: Diagnosis not present

## 2016-02-12 DIAGNOSIS — Z5181 Encounter for therapeutic drug level monitoring: Secondary | ICD-10-CM

## 2016-02-12 DIAGNOSIS — M7918 Myalgia, other site: Secondary | ICD-10-CM

## 2016-02-12 DIAGNOSIS — E785 Hyperlipidemia, unspecified: Secondary | ICD-10-CM | POA: Diagnosis not present

## 2016-02-12 DIAGNOSIS — M797 Fibromyalgia: Secondary | ICD-10-CM | POA: Diagnosis not present

## 2016-02-12 DIAGNOSIS — M4802 Spinal stenosis, cervical region: Secondary | ICD-10-CM

## 2016-02-12 DIAGNOSIS — M5412 Radiculopathy, cervical region: Secondary | ICD-10-CM

## 2016-02-12 DIAGNOSIS — I10 Essential (primary) hypertension: Secondary | ICD-10-CM | POA: Diagnosis not present

## 2016-02-12 DIAGNOSIS — M25512 Pain in left shoulder: Secondary | ICD-10-CM | POA: Diagnosis not present

## 2016-02-12 DIAGNOSIS — I639 Cerebral infarction, unspecified: Secondary | ICD-10-CM | POA: Diagnosis not present

## 2016-02-12 DIAGNOSIS — M5136 Other intervertebral disc degeneration, lumbar region: Secondary | ICD-10-CM | POA: Diagnosis not present

## 2016-02-12 DIAGNOSIS — M545 Low back pain, unspecified: Secondary | ICD-10-CM

## 2016-02-12 DIAGNOSIS — Z79891 Long term (current) use of opiate analgesic: Secondary | ICD-10-CM | POA: Insufficient documentation

## 2016-02-12 DIAGNOSIS — F329 Major depressive disorder, single episode, unspecified: Secondary | ICD-10-CM | POA: Diagnosis not present

## 2016-02-12 DIAGNOSIS — S22022A Unstable burst fracture of second thoracic vertebra, initial encounter for closed fracture: Secondary | ICD-10-CM | POA: Insufficient documentation

## 2016-02-12 DIAGNOSIS — K219 Gastro-esophageal reflux disease without esophagitis: Secondary | ICD-10-CM | POA: Diagnosis not present

## 2016-02-12 DIAGNOSIS — S12600A Unspecified displaced fracture of seventh cervical vertebra, initial encounter for closed fracture: Secondary | ICD-10-CM | POA: Insufficient documentation

## 2016-02-12 DIAGNOSIS — D649 Anemia, unspecified: Secondary | ICD-10-CM | POA: Insufficient documentation

## 2016-02-12 DIAGNOSIS — F17210 Nicotine dependence, cigarettes, uncomplicated: Secondary | ICD-10-CM | POA: Diagnosis not present

## 2016-02-12 DIAGNOSIS — Z79899 Other long term (current) drug therapy: Secondary | ICD-10-CM

## 2016-02-12 MED ORDER — HYDROCODONE-ACETAMINOPHEN 10-325 MG PO TABS: 1.0000 | ORAL_TABLET | Freq: Two times a day (BID) | ORAL | 0 refills | Status: DC

## 2016-02-12 NOTE — Progress Notes (Signed)
Subjective:    Patient ID: [name], female    DOB: [birth year], 75 y.o.   MRN: [id number]  HPI: [name] is a 75 year old female who returns for follow up appointment and medication refill. She states her pain is located in her neck radiating into her left shoulder and mid- lower-back. She rates her pain 7. Her current exercise regime is walking and performing chair exercises.  Pain Inventory Average Pain 8 Pain Right Now 7 My pain is intermittent, constant, burning, dull, stabbing and aching  In the last 24 hours, has pain interfered with the following? General activity 7 Relation with others 5 Enjoyment of life 8 What TIME of day is your pain at its worst? all times Sleep (in general) Fair  Pain is worse with: walking, bending, sitting and standing Pain improves with: medication Relief from Meds: 5  Mobility walk without assistance walk with assistance use a cane how many minutes can you walk? 10 ability to climb steps?  yes do you drive?  yes transfers alone Do you have any goals in this area?  yes  Function disabled: date disabled 107 I need assistance with the following:  meal prep, household duties and shopping Do you have any goals in this area?  no  Neuro/Psych bladder control problems bowel control problems weakness depression  Prior Studies Any changes since last visit?  no  Physicians involved in your care Any changes since last visit?  no   Family History  Problem Relation Age of Onset  . Heart failure Mother   . Heart attack Father    Social History   Social History  . Marital status: Single    Spouse name: N/A  . Number of children: 3  . Years of education: middle sch   Occupational History  . retired     n/a   Social History Main Topics  . Smoking status: Former Smoker    Packs/day: 1.50    Years: 30.00  . Smokeless tobacco: Never Used     Comment: quit 2013  . Alcohol use No  . Drug use: No  . Sexual  activity: No   Other Topics Concern  . None   Social History Narrative   ** Merged History Encounter **       Patient lives at home with daughter. Caffeine Use: 4 cups daily   Past Surgical History:  Procedure Laterality Date  . ABDOMINAL HYSTERECTOMY    . ABDOMINAL SURGERY    . APPENDECTOMY    . BLADDER SURGERY    . CHOLECYSTECTOMY    . COLON SURGERY    . EYE SURGERY     cataracts  . JOINT REPLACEMENT    . KNEE SURGERY    . OSTOMY    . RECTOCELE REPAIR     Past Medical History:  Diagnosis Date  . Anemia   . B12 deficiency   . Bladder incontinence   . Blind left eye   . Cataract   . Cervical spine fracture ([place])   . Compression fracture    C7,  upper T spine -compression fx  . COPD (chronic obstructive pulmonary disease) ([place])   . DDD (degenerative disc disease), lumbar   . Depression    major  . GERD (gastroesophageal reflux disease)   . Hiatal hernia   . Hyperlipidemia   . Hypertension   . Macular degeneration   . Osteoarthritis   . Osteoporosis   . Reactive airway disease   .  Rupture of bowel ([place])   . Stroke [facility])    "mini stroke"  . Stroke ([place])    BP (!) 142/88   Pulse 63   SpO2 95%   Opioid Risk [name]:   Fall Risk [name]:  `1  Depression screen PHQ 2/9  Depression screen PHQ 2/9 03/24/2015  Decreased Interest 2  Down, Depressed, Hopeless 3  PHQ - 2 [name] 5  Altered sleeping 3  Tired, decreased energy 3  Change in appetite 2  Feeling bad or failure about yourself  2  Trouble concentrating 1  Moving slowly or fidgety/restless 0  Suicidal thoughts 0  PHQ-9 [name] 16  Difficult doing work/chores Somewhat difficult     Review of Systems  HENT: Negative.   Eyes: Negative.   Respiratory: Positive for shortness of breath.   Cardiovascular: Negative.   Gastrointestinal: Positive for abdominal pain and nausea.  Endocrine: Negative.   Genitourinary: Negative.   Musculoskeletal: Positive for back pain and neck pain.  Skin: Negative.     Neurological: Positive for weakness.  Hematological: Negative.   Psychiatric/Behavioral: Positive for dysphoric mood.       Objective:   Physical Exam  Constitutional: She is oriented to person, place, and time. She appears well-developed and well-nourished.  HENT:  Head: Normocephalic and atraumatic.  Neck: Normal range of motion. Neck supple.  Cervical Paraspinal Tenderness: C-5-C-6  Cardiovascular: Normal rate and regular rhythm.   Musculoskeletal:  Normal Muscle Bulk and Muscle Testing Reveals: Upper Extremities: Full ROM and Muscle Strength 5/5 Left AC Joint Tenderness Thoracic Paraspinal Tenderness: T-1- T-3    T-7- T-9 Lumbar Paraspinal Tenderness: L-3- L-5 Lower Extremities: Full ROM and Muscle Strength 5/5 Arises from table with ease Narrow based Gait   Neurological: She is alert and oriented to person, place, and time.  Skin: Skin is warm and dry.  Psychiatric: She has a normal mood and affect.  Nursing note and vitals reviewed.         Assessment & Plan:  1. Complex multifactorial pain left side neck and Left shoulder.3 main etiologies: A. myofascial pain left trapezius, left infraspinatus. Scheduled for Trigger Point Injection with Dr. Letta Pate B. Cervical spinal stenosis with left C4-C5 radiculopathy C. Cervical spondylosis with facet arthropathy Refilled: Hydrocodone 10/325 mg one tablet twice a day as needed for moderate pain #60.  We will continue the opioid monitoring program, this consists of regular clinic visits, examinations, urine drug screen, pill counts as well as use of [place] Controlled Substance Reporting System. 2. Cervical Radiculopathy: Continue Gabapentin  20 minutes of face to face patient care time was spent during this visit. All questions were encouraged and answered.

## 2016-02-22 DIAGNOSIS — Z471 Aftercare following joint replacement surgery: Secondary | ICD-10-CM | POA: Diagnosis not present

## 2016-02-22 DIAGNOSIS — Z96651 Presence of right artificial knee joint: Secondary | ICD-10-CM | POA: Diagnosis not present

## 2016-02-22 DIAGNOSIS — M25561 Pain in right knee: Secondary | ICD-10-CM | POA: Diagnosis not present

## 2016-02-28 DIAGNOSIS — M25512 Pain in left shoulder: Secondary | ICD-10-CM | POA: Diagnosis not present

## 2016-02-28 DIAGNOSIS — G8929 Other chronic pain: Secondary | ICD-10-CM | POA: Insufficient documentation

## 2016-03-19 ENCOUNTER — Ambulatory Visit: Payer: Medicare Other | Admitting: Physical Medicine & Rehabilitation

## 2016-03-19 IMAGING — CR DG CHEST 2V
2 series · 2 of 2 positions shown · non-contrast
Comparison: 07/29/2013

CLINICAL DATA: Initial evaluation for acute pain posterior upper
back for 2 weeks with mild anterior chest pain, smoker,
hypertension, COPD

EXAM:
CHEST  2 VIEW

[w chest pa]
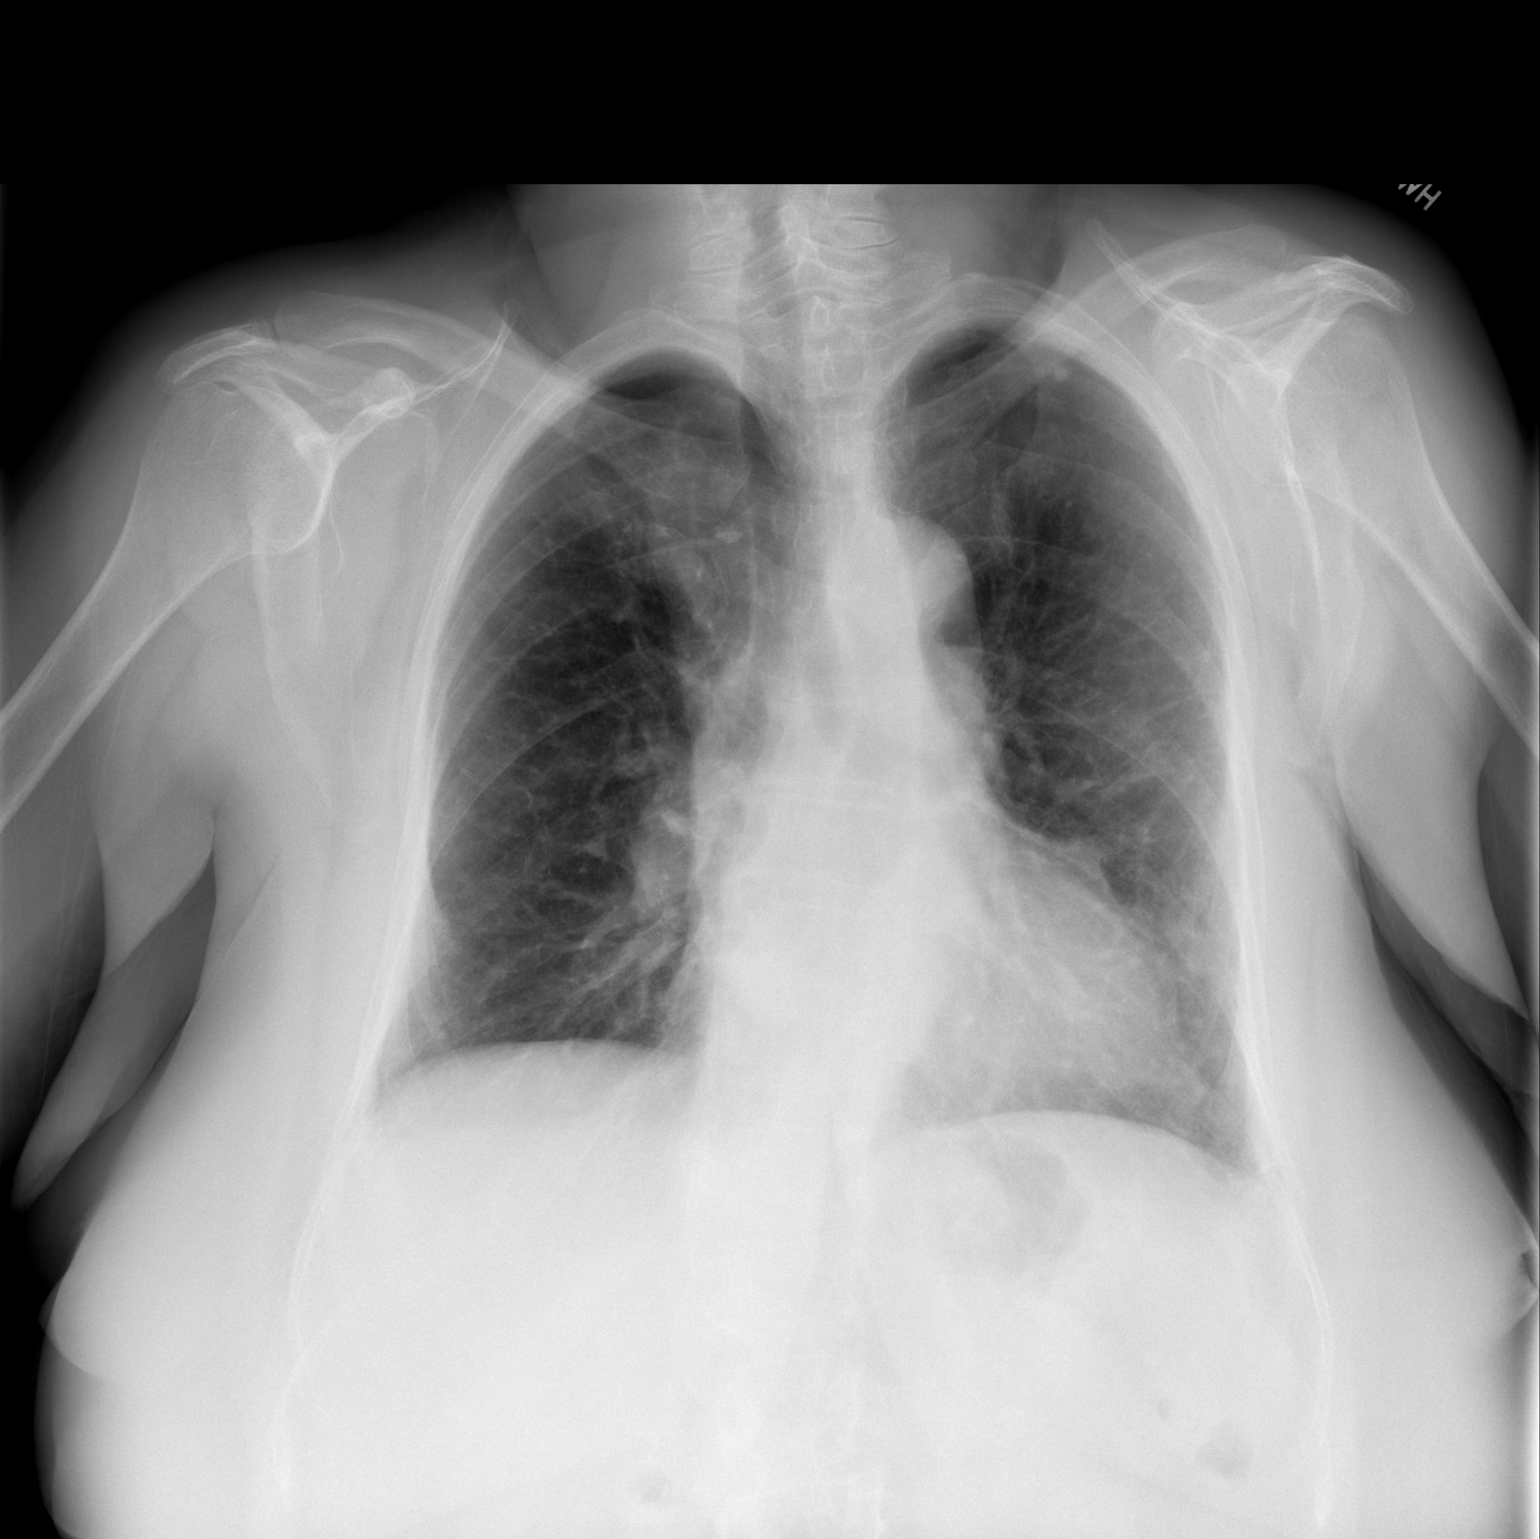

[w chest lat]
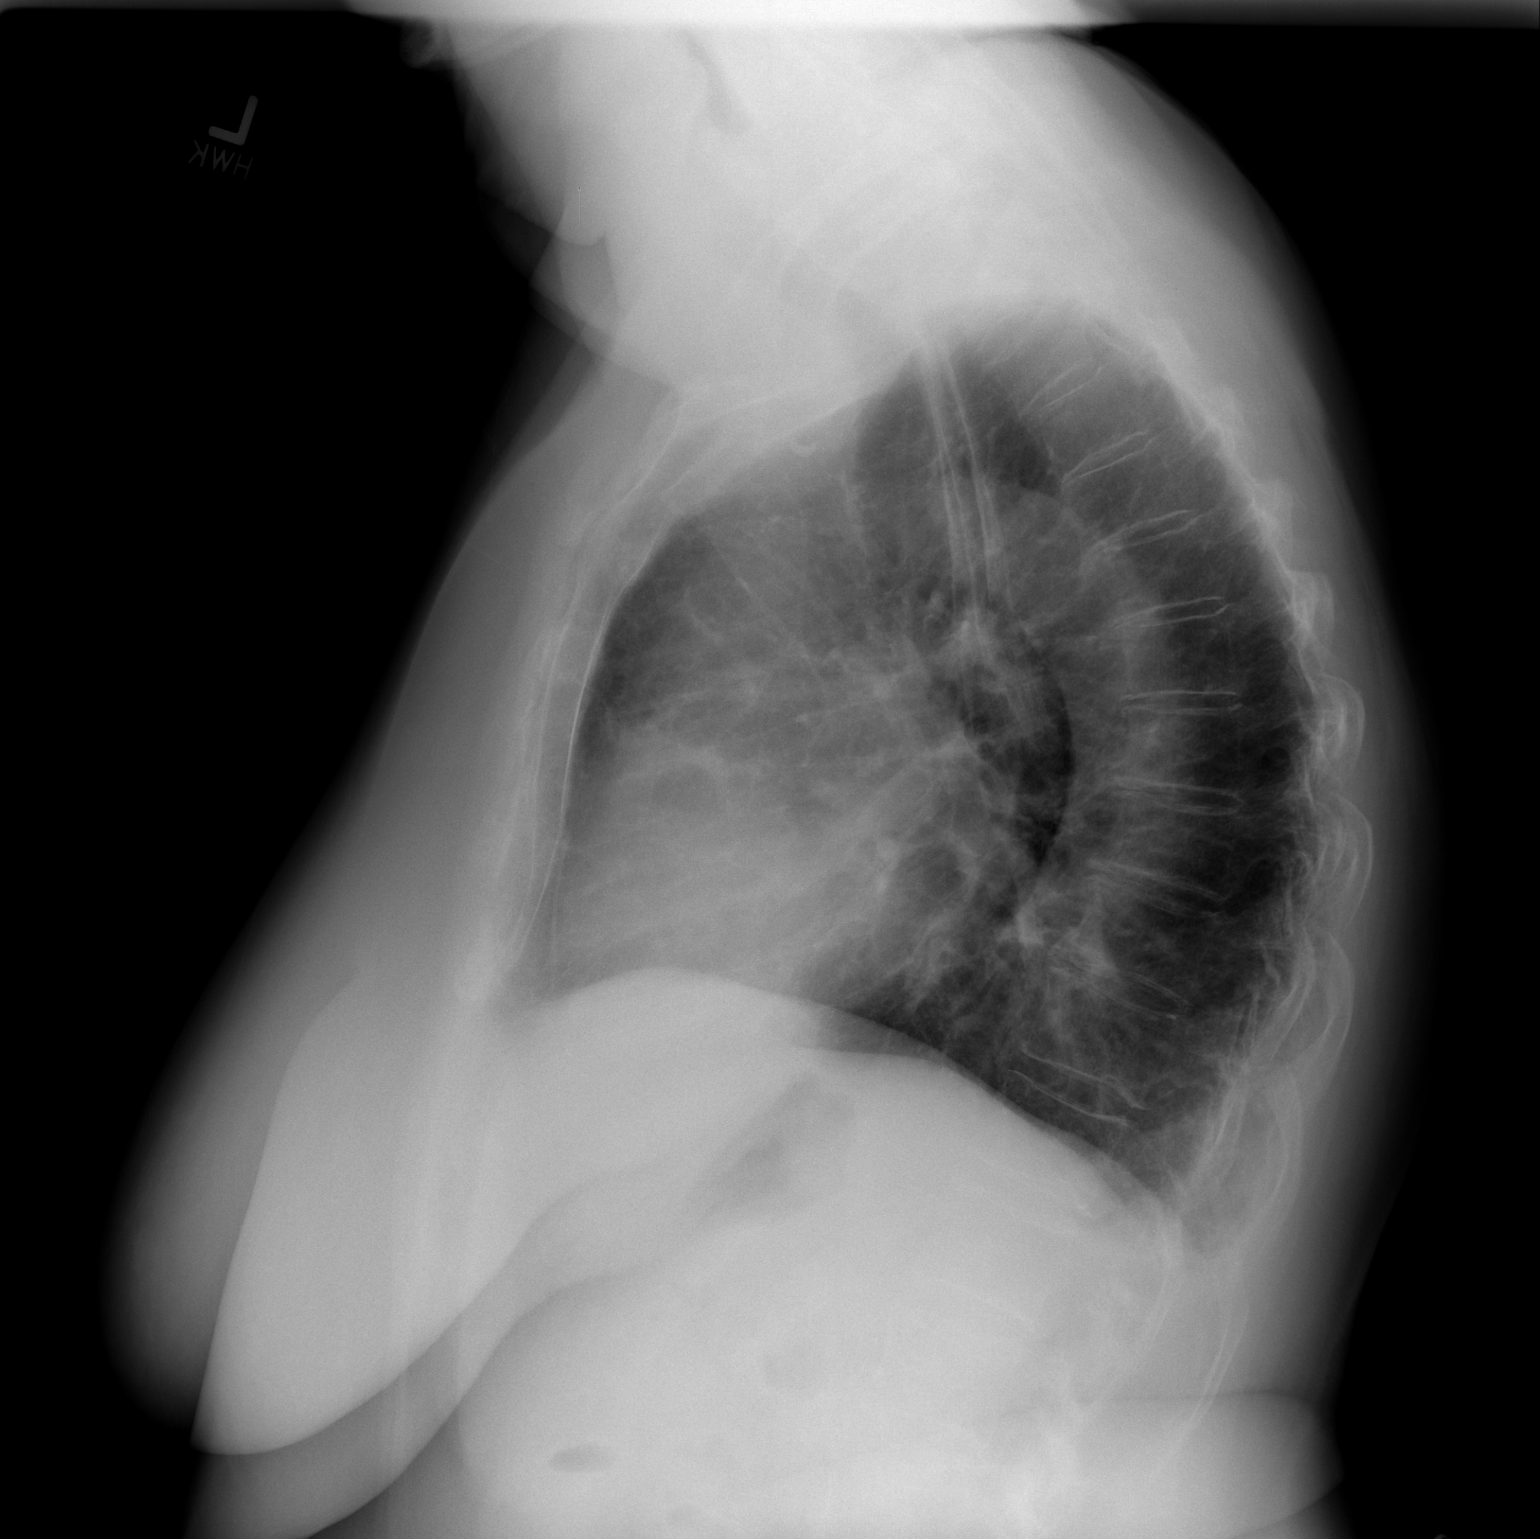

[2 of 2 positions shown; findings below may reference images not displayed]

FINDINGS: Mild cardiac enlargement stable. Tiny calcified nodule left upper
lobe stable measuring about 5 mm.

Mild background interstitial change, stable. No consolidation
infiltrate or effusion. Mild hyperinflation.
IMPRESSION: COPD, with no acute findings

## 2016-03-26 ENCOUNTER — Other Ambulatory Visit: Payer: Self-pay

## 2016-03-26 ENCOUNTER — Encounter (HOSPITAL_COMMUNITY): Payer: Self-pay | Admitting: *Deleted

## 2016-03-26 ENCOUNTER — Emergency Department (HOSPITAL_COMMUNITY): Payer: Medicare Other

## 2016-03-26 ENCOUNTER — Emergency Department (HOSPITAL_COMMUNITY)
Admission: EM | Admit: 2016-03-26 | Discharge: 2016-03-26 | Disposition: A | Discharge status: Home / Self Care | Payer: Medicare Other | Attending: Emergency Medicine | Admitting: Emergency Medicine

## 2016-03-26 DIAGNOSIS — S199XXA Unspecified injury of neck, initial encounter: Secondary | ICD-10-CM | POA: Diagnosis not present

## 2016-03-26 DIAGNOSIS — J449 Chronic obstructive pulmonary disease, unspecified: Secondary | ICD-10-CM | POA: Insufficient documentation

## 2016-03-26 DIAGNOSIS — S4992XA Unspecified injury of left shoulder and upper arm, initial encounter: Secondary | ICD-10-CM | POA: Diagnosis not present

## 2016-03-26 DIAGNOSIS — Z79899 Other long term (current) drug therapy: Secondary | ICD-10-CM | POA: Diagnosis not present

## 2016-03-26 DIAGNOSIS — T148XXA Other injury of unspecified body region, initial encounter: Secondary | ICD-10-CM | POA: Diagnosis not present

## 2016-03-26 DIAGNOSIS — Z87891 Personal history of nicotine dependence: Secondary | ICD-10-CM | POA: Insufficient documentation

## 2016-03-26 DIAGNOSIS — I10 Essential (primary) hypertension: Secondary | ICD-10-CM | POA: Diagnosis not present

## 2016-03-26 DIAGNOSIS — M25512 Pain in left shoulder: Secondary | ICD-10-CM | POA: Diagnosis not present

## 2016-03-26 DIAGNOSIS — R51 Headache: Secondary | ICD-10-CM | POA: Diagnosis not present

## 2016-03-26 DIAGNOSIS — Z8673 Personal history of transient ischemic attack (TIA), and cerebral infarction without residual deficits: Secondary | ICD-10-CM | POA: Diagnosis not present

## 2016-03-26 DIAGNOSIS — R55 Syncope and collapse: Secondary | ICD-10-CM | POA: Insufficient documentation

## 2016-03-26 DIAGNOSIS — M25552 Pain in left hip: Secondary | ICD-10-CM | POA: Diagnosis not present

## 2016-03-26 DIAGNOSIS — M542 Cervicalgia: Secondary | ICD-10-CM | POA: Diagnosis not present

## 2016-03-26 LAB — COMPREHENSIVE METABOLIC PANEL
ALT: 15 U/L (ref 14–54)
AST: 23 U/L (ref 15–41)
Albumin: 3.8 g/dL (ref 3.5–5.0)
Alkaline Phosphatase: 74 U/L (ref 38–126)
Anion gap: 9 (ref 5–15)
BUN: 8 mg/dL (ref 6–20)
CO2: 24 mmol/L (ref 22–32)
Calcium: 9.7 mg/dL (ref 8.9–10.3)
Chloride: 108 mmol/L (ref 101–111)
Creatinine, Ser: 1 mg/dL (ref 0.44–1.00)
GFR calc Af Amer: >60 mL/min (ref >60)
GFR calc non Af Amer: 54 mL/min — ABNORMAL LOW (ref >60)
Glucose, Bld: 84 mg/dL (ref 65–99)
Potassium: 4.4 mmol/L (ref 3.5–5.1)
Sodium: 141 mmol/L (ref 135–145)
Total Bilirubin: 0.3 mg/dL (ref 0.3–1.2)
Total Protein: 7.1 g/dL (ref 6.5–8.1)

## 2016-03-26 LAB — URINALYSIS, ROUTINE W REFLEX MICROSCOPIC
Bilirubin Urine: NEGATIVE
Glucose, UA: NEGATIVE mg/dL
Hgb urine dipstick: NEGATIVE
Ketones, ur: NEGATIVE mg/dL
Leukocytes, UA: NEGATIVE
Nitrite: NEGATIVE
Protein, ur: NEGATIVE mg/dL
Specific Gravity, Urine: 1.006 (ref 1.005–1.030)
pH: 7 (ref 5.0–8.0)

## 2016-03-26 LAB — CBC WITH DIFFERENTIAL/PLATELET
Basophils Absolute: 0 10*3/uL (ref 0.0–0.1)
Basophils Relative: 1 %
Eosinophils Absolute: 0.3 10*3/uL (ref 0.0–0.7)
Eosinophils Relative: 5 %
HCT: 45.3 % (ref 36.0–46.0)
Hemoglobin: 14.7 g/dL (ref 12.0–15.0)
Lymphocytes Relative: 43 %
Lymphs Abs: 2 10*3/uL (ref 0.7–4.0)
MCH: 29.8 pg (ref 26.0–34.0)
MCHC: 32.5 g/dL (ref 30.0–36.0)
MCV: 91.7 fL (ref 78.0–100.0)
Monocytes Absolute: 0.5 10*3/uL (ref 0.1–1.0)
Monocytes Relative: 11 %
Neutro Abs: 1.9 10*3/uL (ref 1.7–7.7)
Neutrophils Relative %: 40 %
Platelets: 169 10*3/uL (ref 150–400)
RBC: 4.94 MIL/uL (ref 3.87–5.11)
RDW: 14.1 % (ref 11.5–15.5)
WBC: 4.7 10*3/uL (ref 4.0–10.5)

## 2016-03-26 MED ORDER — FENTANYL CITRATE (PF) 100 MCG/2ML IJ SOLN
50.0000 ug | Freq: Once | INTRAMUSCULAR | Status: AC
  Administered 2016-03-26: 50 ug via INTRAVENOUS
  Filled 2016-03-26: qty 2

## 2016-03-26 NOTE — ED Notes (Signed)
Phleb will recollect CBC

## 2016-03-26 NOTE — ED Triage Notes (Signed)
Pt arrives from home via [facility] after having a witnessed syncopal episode today from a standing position. Pt has a hx of cervical fx and arrives in a collar.

## 2016-03-26 NOTE — ED Notes (Signed)
Pt. Daughter seen in another room in ED. Pt. Taken to daughter, daughter will be driving her home. NAD. No discharge questions at this time.

## 2016-03-26 NOTE — ED Notes (Signed)
Pt refused bedpan and requested using restroom. Pt unsteady gait to restroom but states "I need to go right now" and continued walking to restroom. Pt family member called out when pt finished and states pt having difficulty standing. This RN witnessed loss of consciousness when pt was sitting on toilet. Pt did not hit head and regained consciousness after 5-10 seconds. Pt now A&O x 4. Dr. Sabra Heck, [place] aware.

## 2016-03-26 NOTE — ED Notes (Signed)
PA at bedside.

## 2016-03-26 NOTE — ED Provider Notes (Signed)
[place] DEPT Provider Note   CSN: [id number] Arrival date & time: 03/26/16  1321  History   Chief Complaint Chief Complaint  Patient presents with  . Loss of Consciousness   HPI [name] is a 75 y.o. female with extensive pmh including recurrent and sporadic syncopal episodes, stroke, c4-c7 stenosis and C7 and T2 compression fracture after fall in 09/2014, copd, depression who is here s/p witnessed fall at home from standing.  Pt passed out and fell towards her left side and her daughter was able to catch her, they both fell and pt struck back of her head, left shoulder and left hip on the floor.  Pt denies postictal confusion, incontinence, tongue biting.  Pt has had "countless passing out spells" per daughter.  Passing out spells started after her fall on 09/2014 when she suffered cervical fractures as noted above.  Pt's daughter states pt passes out as often as a few times a week and sometimes goes months without passing out.  Pt's daughter states when pt is stressed or scared she passes out as well.  Pt wakes up after passing out within 30 seconds to 1 minute.  Pt is followed by a neurologist, last seen by him on 08/15/2015 who recommended close f/u with pcp, pain management and psychiatric management.  There were no recent med changes by neurologist at that last visit, per chart.  Pt also followed by pain management, who she has an appointment with this upcoming Friday 03/29/2016. Pt states "the pain medications they give me don't ever work".  Pt is endorsing severe posterior neck pain, L shoulder and hip pain since fall this morning.   She arrived to ED with cervical collar.  HPI  Past Medical History:  Diagnosis Date  . Anemia   . B12 deficiency   . Bladder incontinence   . Blind left eye   . Cataract   . Cervical spine fracture ([place])   . Compression fracture    C7,  upper T spine -compression fx  . COPD (chronic obstructive pulmonary disease) ([place])   . DDD  (degenerative disc disease), lumbar   . Depression    major  . GERD (gastroesophageal reflux disease)   . Hiatal hernia   . Hyperlipidemia   . Hypertension   . Macular degeneration   . Osteoarthritis   . Osteoporosis   . Reactive airway disease   . Rupture of bowel ([place])   . Stroke [facility])    "mini stroke"  . Stroke [facility])     Patient Active Problem List   Diagnosis Date Noted  . Spinal stenosis in cervical region 06/27/2015  . Syncope 05/22/2015  . Diffuse myofascial pain syndrome 03/24/2015  . Sepsis ([place]) 10/08/2014  . Bacteremia 10/07/2014  . Leukopenia 10/07/2014  . Thrombocytopenia ([place]) 10/07/2014  . Fever 10/05/2014  . Hypoxia 10/05/2014  . HLD (hyperlipidemia) 07/30/2013  . HTN (hypertension) 07/30/2013  . GERD (gastroesophageal reflux disease) 07/30/2013  . Chest pain 07/29/2013  . Dizziness and giddiness 06/18/2013  . Gait difficulty 05/03/2013  . Memory loss 05/03/2013    Past Surgical History:  Procedure Laterality Date  . ABDOMINAL HYSTERECTOMY    . ABDOMINAL SURGERY    . APPENDECTOMY    . BLADDER SURGERY    . CHOLECYSTECTOMY    . COLON SURGERY    . EYE SURGERY     cataracts  . JOINT REPLACEMENT    . KNEE SURGERY    . OSTOMY    . RECTOCELE REPAIR  OB History    Gravida Para Term Preterm AB Living   0 0 0 0 0     SAB TAB Ectopic Multiple Live Births   0 0 0           Home Medications    Prior to Admission medications   Medication Sig Start Date End Date Taking? Authorizing Provider  albuterol (PROVENTIL HFA;VENTOLIN HFA) 108 (90 BASE) MCG/ACT inhaler Inhale 1 puff into the lungs every 6 (six) hours as needed for wheezing or shortness of breath.   Yes Historical Provider, MD  albuterol (PROVENTIL) (2.5 MG/3ML) 0.083% nebulizer solution Take 2.5 mg by nebulization every 6 (six) hours as needed for wheezing or shortness of breath.   Yes Historical Provider, MD  ALPRAZolam Duanne Moron) 1 MG tablet Take 1 tablet (1 mg total) by mouth at bedtime  as needed for anxiety. Patient taking differently: Take 1 mg by mouth at bedtime.  05/24/15  Yes Jessica U Vann, DO  amLODipine (NORVASC) 5 MG tablet Take 5 mg by mouth daily.  04/06/13  Yes Historical Provider, MD  citalopram (CELEXA) 20 MG tablet Take 20 mg by mouth daily.   Yes Historical Provider, MD  DOCUSATE SODIUM PO Take 1 capsule by mouth daily.   Yes Historical Provider, MD  esomeprazole (NEXIUM) 40 MG capsule Take 40 mg by mouth daily.    Yes Historical Provider, MD  HYDROcodone-acetaminophen (NORCO) 10-325 MG tablet Take 1 tablet by mouth 2 (two) times daily. 02/12/16  Yes Bayard Hugger, NP  IRON PO Take 1 tablet by mouth every other day.    Yes Historical Provider, MD  KRILL OIL PO Take 1 tablet by mouth daily.   Yes Historical Provider, MD  lovastatin (MEVACOR) 10 MG tablet Take 10 mg by mouth daily. 09/22/14  Yes Historical Provider, MD  meclizine (ANTIVERT) 12.5 MG tablet Take 12.5 mg by mouth 3 (three) times daily as needed for nausea.   Yes Historical Provider, MD  polyethylene glycol powder (GLYCOLAX/MIRALAX) powder Take 17 g by mouth daily. For constipation   Yes Historical Provider, MD  predniSONE (DELTASONE) 20 MG tablet Take 2 tablets (40 mg total) by mouth daily. 01/23/16  Yes Virgel Manifold, MD  senna (SENOKOT) 8.6 MG tablet Take 1-2 tablets by mouth daily.    Yes Historical Provider, MD  traZODone (DESYREL) 50 MG tablet Take 0.5 tablets (25 mg total) by mouth at bedtime. 12/30/14  Yes Penni Bombard, MD  vitamin B-12 (CYANOCOBALAMIN) 1000 MCG tablet Take 1,000 mcg by mouth daily.   Yes Historical Provider, MD  acetaminophen (TYLENOL) 325 MG tablet Take 2 tablets (650 mg total) by mouth every 6 (six) hours as needed for mild pain (or Fever >/= 101). Patient not taking: Reported on 03/26/2016 05/24/15   Geradine Girt, DO  azithromycin (ZITHROMAX Z-PAK) 250 MG tablet Take 1 tablet (250 mg total) by mouth daily. 2 pills ('500mg'$ ) day 1 then 1 pill ('250mg'$ ) days 2-5. Patient not  taking: Reported on 03/26/2016 01/23/16   Virgel Manifold, MD  gabapentin (NEURONTIN) 400 MG capsule TAKE 1 CAPSULE BY MOUTH FOUR TIMES A DAY Patient not taking: Reported on 03/26/2016 11/24/15   Charlett Blake, MD    Family History Family History  Problem Relation Age of Onset  . Heart failure Mother   . Heart attack Father     Social History Social History  Substance Use Topics  . Smoking status: Former Smoker    Packs/day: 1.50    Years: 30.00  .  Smokeless tobacco: Never Used     Comment: quit 2013  . Alcohol use No     Allergies   Ampicillin-sulbactam sodium; Ampicillin; Toradol [ketorolac tromethamine]; Tramadol; Ambien [zolpidem tartrate]; Cephalexin; Ketorolac; Percocet [oxycodone-acetaminophen]; Sulfamethoxazole-trimethoprim; and Tape   Review of Systems Review of Systems  Constitutional: Negative for appetite change, chills, fatigue and fever.  HENT: Negative.   Eyes: Negative.   Respiratory: Negative.   Cardiovascular: Negative.   Gastrointestinal: Negative.        Pt states her abdomen is always tender since ostomy reversal.  Endocrine: Negative.   Genitourinary: Negative.        Pt incontinent at night  Musculoskeletal: Positive for arthralgias, back pain, neck pain and neck stiffness.       Chronic neck pain since cervical/T2 fractures after fall in 2016  Skin: Negative.   Neurological: Positive for syncope (Recurrent, sporadic passing out spells since 09/2014 fall).  Hematological: Negative.   Psychiatric/Behavioral: Negative for confusion and hallucinations. The patient is nervous/anxious.      Physical Exam Updated Vital Signs BP 150/86 (BP Location: Left Arm)   Pulse 69   Temp 98.1 F (36.7 C) (Oral)   Resp 18   Ht '5\' 5"'$  (1.651 m)   Wt 82.6 kg   SpO2 94%   BMI 30.29 kg/m   Physical Exam  Constitutional: She is oriented to person, place, and time. She appears well-developed and well-nourished.  Pleasant, talkative, alert female in  cervical collar. Pt's daughter at bedside providing some history   HENT:  Head: Normocephalic and atraumatic.  Mouth/Throat: Oropharynx is clear and moist.  Poor dentition, pt left dentures at home  Eyes: Conjunctivae are normal. Pupils are equal, round, and reactive to light.  L eyelid ptosis, chronic since previous stroke  Neck:  In cervical collar  Cardiovascular: Normal rate, regular rhythm, normal heart sounds and intact distal pulses.   Pulmonary/Chest: Effort normal and breath sounds normal. No respiratory distress.  Abdominal: Soft. Bowel sounds are normal. She exhibits no distension and no mass. There is tenderness (diffusely). There is no guarding.  Musculoskeletal:  ROM basically intact at L shoulder and L hip, limited by pain from fall this morning.  ROM intact at other upper and lower extremities.   Neurological: She is alert and oriented to person, place, and time.  Decreased strength on L side, chronic since previous stroke. Sensation intact. No facial droop.  No arm drop.  Normal speech. Unable to assess gait due to pain, c spine not clear at time of exam, waiting on CT head/cervical spine.  Skin: Skin is warm and dry. Capillary refill takes less than 2 seconds. She is not diaphoretic.  Psychiatric: She has a normal mood and affect.  Appropriately teary eyed from fall this morning and current pain.     ED Treatments / Results  Labs (all labs ordered are listed, but only abnormal results are displayed) Labs Reviewed  COMPREHENSIVE METABOLIC PANEL - Abnormal; Notable for the following:       Result Value   GFR calc non Af Amer 54 (*)    All other components within normal limits  URINALYSIS, ROUTINE W REFLEX MICROSCOPIC (NOT AT [facility])  CBC WITH DIFFERENTIAL/PLATELET  CBC WITH DIFFERENTIAL/PLATELET    EKG  EKG Interpretation  Date/Time:  Tuesday March 26 2016 13:20:07 EDT Ventricular Rate:  62 PR Interval:  188 QRS Duration: 82 QT Interval:  424 QTC  Calculation: 430 R Axis:   28 Text Interpretation:  Normal sinus rhythm  Cannot rule out Anterior infarct , age undetermined Abnormal ECG Since last tracing ST changes diffusely Confirmed by Sabra Heck  MD, BRIAN ([id number]) on 03/26/2016 1:25:21 PM Also confirmed by Sabra Heck  MD, [place] ([id number]), editor [place] CT, Leda Gauze [phone])  on 03/26/2016 3:36:16 PM       Radiology Ct Head Wo Contrast  Result Date: 03/26/2016 CLINICAL DATA:  Syncopal episode, headache rollover.  Low neck pain. EXAM: CT HEAD WITHOUT CONTRAST CT CERVICAL SPINE WITHOUT CONTRAST TECHNIQUE: Multidetector CT imaging of the head and cervical spine was performed following the standard protocol without intravenous contrast. Multiplanar CT image reconstructions of the cervical spine were also generated. COMPARISON:  MRI cervical spine 08/30/2015. CT cervical spine 05/12/2015. FINDINGS: CT HEAD FINDINGS Brain: No intracranial hemorrhage. No parenchymal contusion. No midline shift or mass effect. Basilar cisterns are patent. No skull base fracture. No fluid in the paranasal sinuses or mastoid air cells. Orbits are normal. Generalized atrophy. Vascular: No hyperdense vessel or unexpected calcification. Skull: Normal. Negative for fracture or focal lesion. Sinuses/Orbits: No acute finding. Other: None CT CERVICAL SPINE FINDINGS Alignment: Normal. Skull base and vertebrae: No acute fracture. No primary bone lesion or focal pathologic process. Soft tissues and spinal canal: No prevertebral fluid or swelling. No visible canal hematoma. Disc levels: No acute findings in the distal vertebral bodies. There is endplate deformity at C7 with approximately 20% loss vertebral body height. Upper chest: Clear Other: None IMPRESSION: 1. No intracranial trauma. 2. Mild atrophy. 3. No cervical spine fracture. 4. Chronic compression deformity at T7. Electronically Signed   By: Suzy Bouchard M.D.   On: 03/26/2016 15:13   Ct Cervical Spine Wo Contrast  Result Date:  03/26/2016 CLINICAL DATA:  Syncopal episode, headache rollover.  Low neck pain. EXAM: CT HEAD WITHOUT CONTRAST CT CERVICAL SPINE WITHOUT CONTRAST TECHNIQUE: Multidetector CT imaging of the head and cervical spine was performed following the standard protocol without intravenous contrast. Multiplanar CT image reconstructions of the cervical spine were also generated. COMPARISON:  MRI cervical spine 08/30/2015. CT cervical spine 05/12/2015. FINDINGS: CT HEAD FINDINGS Brain: No intracranial hemorrhage. No parenchymal contusion. No midline shift or mass effect. Basilar cisterns are patent. No skull base fracture. No fluid in the paranasal sinuses or mastoid air cells. Orbits are normal. Generalized atrophy. Vascular: No hyperdense vessel or unexpected calcification. Skull: Normal. Negative for fracture or focal lesion. Sinuses/Orbits: No acute finding. Other: None CT CERVICAL SPINE FINDINGS Alignment: Normal. Skull base and vertebrae: No acute fracture. No primary bone lesion or focal pathologic process. Soft tissues and spinal canal: No prevertebral fluid or swelling. No visible canal hematoma. Disc levels: No acute findings in the distal vertebral bodies. There is endplate deformity at C7 with approximately 20% loss vertebral body height. Upper chest: Clear Other: None IMPRESSION: 1. No intracranial trauma. 2. Mild atrophy. 3. No cervical spine fracture. 4. Chronic compression deformity at T7. Electronically Signed   By: Suzy Bouchard M.D.   On: 03/26/2016 15:13   Dg Shoulder Left  Result Date: 03/26/2016 CLINICAL DATA:  Syncopal episode, falling onto the left shoulder was subsequent pain. EXAM: LEFT SHOULDER - 2+ VIEW COMPARISON:  04/24/2015 FINDINGS: Humeral head is properly located. No evidence of regional fracture. No degenerative change. Normal humeral acromial distance. IMPRESSION: Negative radiographs. Electronically Signed   By: Nelson Chimes M.D.   On: 03/26/2016 15:26   Dg Hip Unilat With Pelvis  2-3 Views Left  Result Date: 03/26/2016 CLINICAL DATA:  Syncopal episode falling onto the left side with  left-sided pain. EXAM: DG HIP (WITH OR WITHOUT PELVIS) 2-3V LEFT COMPARISON:  None. FINDINGS: No pelvic fracture. No hip fracture. No significant degenerative change. IMPRESSION: Negative radiographs. Electronically Signed   By: Nelson Chimes M.D.   On: 03/26/2016 15:27    Procedures Procedures (including critical care time)  Medications Ordered in ED Medications  fentaNYL (SUBLIMAZE) injection 50 mcg (50 mcg Intravenous Given 03/26/16 1439)  fentaNYL (SUBLIMAZE) injection 50 mcg (50 mcg Intravenous Given 03/26/16 1631)     Initial Impression / Assessment and Plan / ED Course  I have reviewed the triage vital signs and the nursing notes.  Pertinent labs & imaging results that were available during my care of the patient were reviewed by me and considered in my medical decision making (see chart for details).  Clinical Course   75 yo female with pmh of C7 and T7 compression fractures, osteoarthritis, stroke, depression, COPD, HTN, HLD, chronic pain presented to ED after syncopal episode and witnessed fall from standing in her home.  Pt has had sporadic and recurrent syncopal episodes and falls in her home since approximately 2014, per last neurology note in chart.  I spent a good amount of time talking to the pt and her daughter and they communicated that nobody knows why she keeps experiencing syncopal episodes and that this has happened for many years.  Pt is followed by neurologist (Dr. Andrey Spearman) who last saw her on 08/15/2015.  He documented that pt continues to have balance issues, falls and multiple ER visits.  There have been no recent interventions by neurology, per chart.  Pt also followed by pain team, ortho, PT. Ddx is  extensive for someone her age with such extensive pmh.  It appears neurology is taking a supportive, non-invasive approach at this point.  ED management was  geared towards ruling out injuries from fall.  CT head, CT cervical spine, EKG, CMP, CBC, left hip, left shoulder x-rays and U/A are all completely normal today.  Fentanyl x 2 was used for pain control.  Pt's pain was under good control and pt asked for a snack and drink prior to discharge.  Pt was discharged and instructed to take current home pain medications.  Pt lives with daughter who is home with her most of the time.  Gave fall precautions.  Pt has a pain medicine appointment on 03/29/16.    Final Clinical Impressions(s) / ED Diagnoses   Final diagnoses:  Syncope, unspecified syncope type    New Prescriptions Discharge Medication List as of 03/26/2016  4:54 PM       Kinnie Feil, PA-C 03/27/16 [place], MD 03/28/16 [phone]

## 2016-03-26 NOTE — ED Notes (Signed)
Patient transported to CT 

## 2016-03-26 NOTE — Discharge Instructions (Signed)
Please take your home pain medications as needed for neck and back pain Discuss pain medication changes with your pain provider next week

## 2016-03-29 ENCOUNTER — Ambulatory Visit
Admission: RE | Admit: 2016-03-29 | Discharge: 2016-03-29 | Disposition: A | Discharge status: Home / Self Care | Payer: Medicare Other | Source: Ambulatory Visit | Attending: Physical Medicine & Rehabilitation | Admitting: Physical Medicine & Rehabilitation

## 2016-03-29 ENCOUNTER — Encounter: Payer: Self-pay | Admitting: Physical Medicine & Rehabilitation

## 2016-03-29 ENCOUNTER — Ambulatory Visit: Payer: Medicare Other | Admitting: Physical Medicine & Rehabilitation

## 2016-03-29 ENCOUNTER — Other Ambulatory Visit: Payer: Self-pay | Admitting: Physical Medicine & Rehabilitation

## 2016-03-29 ENCOUNTER — Encounter: Payer: Medicare Other | Attending: Physical Medicine & Rehabilitation

## 2016-03-29 VITALS — BP 161/72 | HR 62

## 2016-03-29 DIAGNOSIS — M546 Pain in thoracic spine: Secondary | ICD-10-CM | POA: Diagnosis not present

## 2016-03-29 DIAGNOSIS — Z79891 Long term (current) use of opiate analgesic: Secondary | ICD-10-CM | POA: Insufficient documentation

## 2016-03-29 DIAGNOSIS — K219 Gastro-esophageal reflux disease without esophagitis: Secondary | ICD-10-CM | POA: Insufficient documentation

## 2016-03-29 DIAGNOSIS — H3530 Unspecified macular degeneration: Secondary | ICD-10-CM | POA: Insufficient documentation

## 2016-03-29 DIAGNOSIS — M549 Dorsalgia, unspecified: Secondary | ICD-10-CM

## 2016-03-29 DIAGNOSIS — Z79899 Other long term (current) drug therapy: Secondary | ICD-10-CM

## 2016-03-29 DIAGNOSIS — M47812 Spondylosis without myelopathy or radiculopathy, cervical region: Secondary | ICD-10-CM

## 2016-03-29 DIAGNOSIS — J449 Chronic obstructive pulmonary disease, unspecified: Secondary | ICD-10-CM | POA: Insufficient documentation

## 2016-03-29 DIAGNOSIS — S63502A Unspecified sprain of left wrist, initial encounter: Secondary | ICD-10-CM | POA: Insufficient documentation

## 2016-03-29 DIAGNOSIS — Z5181 Encounter for therapeutic drug level monitoring: Secondary | ICD-10-CM | POA: Diagnosis not present

## 2016-03-29 DIAGNOSIS — F17210 Nicotine dependence, cigarettes, uncomplicated: Secondary | ICD-10-CM | POA: Insufficient documentation

## 2016-03-29 DIAGNOSIS — I10 Essential (primary) hypertension: Secondary | ICD-10-CM | POA: Insufficient documentation

## 2016-03-29 DIAGNOSIS — F068 Other specified mental disorders due to known physiological condition: Secondary | ICD-10-CM

## 2016-03-29 DIAGNOSIS — E785 Hyperlipidemia, unspecified: Secondary | ICD-10-CM | POA: Insufficient documentation

## 2016-03-29 DIAGNOSIS — S12600A Unspecified displaced fracture of seventh cervical vertebra, initial encounter for closed fracture: Secondary | ICD-10-CM | POA: Diagnosis not present

## 2016-03-29 DIAGNOSIS — M1990 Unspecified osteoarthritis, unspecified site: Secondary | ICD-10-CM | POA: Insufficient documentation

## 2016-03-29 DIAGNOSIS — F329 Major depressive disorder, single episode, unspecified: Secondary | ICD-10-CM | POA: Insufficient documentation

## 2016-03-29 DIAGNOSIS — M25512 Pain in left shoulder: Secondary | ICD-10-CM | POA: Insufficient documentation

## 2016-03-29 DIAGNOSIS — M810 Age-related osteoporosis without current pathological fracture: Secondary | ICD-10-CM | POA: Diagnosis not present

## 2016-03-29 DIAGNOSIS — I639 Cerebral infarction, unspecified: Secondary | ICD-10-CM | POA: Insufficient documentation

## 2016-03-29 DIAGNOSIS — R269 Unspecified abnormalities of gait and mobility: Secondary | ICD-10-CM

## 2016-03-29 DIAGNOSIS — M5136 Other intervertebral disc degeneration, lumbar region: Secondary | ICD-10-CM | POA: Diagnosis not present

## 2016-03-29 DIAGNOSIS — M797 Fibromyalgia: Secondary | ICD-10-CM | POA: Diagnosis not present

## 2016-03-29 DIAGNOSIS — D649 Anemia, unspecified: Secondary | ICD-10-CM | POA: Diagnosis not present

## 2016-03-29 DIAGNOSIS — S299XXA Unspecified injury of thorax, initial encounter: Secondary | ICD-10-CM | POA: Diagnosis not present

## 2016-03-29 DIAGNOSIS — S22022A Unstable burst fracture of second thoracic vertebra, initial encounter for closed fracture: Secondary | ICD-10-CM | POA: Insufficient documentation

## 2016-03-29 MED ORDER — HYDROCODONE-ACETAMINOPHEN 10-325 MG PO TABS: 1.0000 | ORAL_TABLET | Freq: Two times a day (BID) | ORAL | 0 refills | Status: DC
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
## 2016-03-29 NOTE — Patient Instructions (Addendum)
Please get the walker back from your sister and start using it.  Non medication pain relief  Try ice 2mn alternating with heating pad for 20 minutes every 2 hours as needed    Try various muscle cremes  Aspercreme or others that contain trolamine- this is anti inflammatory  Capsaicin creme which reduces Pain substance P  Lidocaine containing creme which has a numbing medicine  Methol and camphor containing creme which has a cooling effect

## 2016-04-04 DIAGNOSIS — M25562 Pain in left knee: Secondary | ICD-10-CM | POA: Diagnosis not present

## 2016-04-04 DIAGNOSIS — Z471 Aftercare following joint replacement surgery: Secondary | ICD-10-CM | POA: Diagnosis not present

## 2016-04-04 DIAGNOSIS — S8992XA Unspecified injury of left lower leg, initial encounter: Secondary | ICD-10-CM | POA: Diagnosis not present

## 2016-04-04 DIAGNOSIS — Z96653 Presence of artificial knee joint, bilateral: Secondary | ICD-10-CM | POA: Insufficient documentation

## 2016-04-04 DIAGNOSIS — Z96652 Presence of left artificial knee joint: Secondary | ICD-10-CM | POA: Diagnosis not present

## 2016-04-04 LAB — TOXASSURE SELECT,+ANTIDEPR,UR

## 2016-04-05 ENCOUNTER — Telehealth: Payer: Self-pay | Admitting: Diagnostic Neuroimaging

## 2016-04-05 NOTE — Progress Notes (Signed)
Urine drug screen for this encounter is consistent for prescribed medication 

## 2016-04-15 ENCOUNTER — Encounter: Payer: Self-pay | Admitting: Emergency Medicine

## 2016-04-15 ENCOUNTER — Emergency Department: Payer: Medicare Other

## 2016-04-15 ENCOUNTER — Emergency Department
Admission: EM | Admit: 2016-04-15 | Discharge: 2016-04-16 | Disposition: A | Discharge status: Home / Self Care | Payer: Medicare Other | Attending: Emergency Medicine | Admitting: Emergency Medicine

## 2016-04-15 DIAGNOSIS — J449 Chronic obstructive pulmonary disease, unspecified: Secondary | ICD-10-CM | POA: Insufficient documentation

## 2016-04-15 DIAGNOSIS — Z79899 Other long term (current) drug therapy: Secondary | ICD-10-CM | POA: Insufficient documentation

## 2016-04-15 DIAGNOSIS — Z87891 Personal history of nicotine dependence: Secondary | ICD-10-CM | POA: Diagnosis not present

## 2016-04-15 DIAGNOSIS — R079 Chest pain, unspecified: Secondary | ICD-10-CM | POA: Insufficient documentation

## 2016-04-15 DIAGNOSIS — J45909 Unspecified asthma, uncomplicated: Secondary | ICD-10-CM | POA: Diagnosis not present

## 2016-04-15 DIAGNOSIS — R1030 Lower abdominal pain, unspecified: Secondary | ICD-10-CM | POA: Diagnosis not present

## 2016-04-15 DIAGNOSIS — N390 Urinary tract infection, site not specified: Secondary | ICD-10-CM | POA: Insufficient documentation

## 2016-04-15 DIAGNOSIS — I10 Essential (primary) hypertension: Secondary | ICD-10-CM | POA: Diagnosis not present

## 2016-04-15 DIAGNOSIS — R109 Unspecified abdominal pain: Secondary | ICD-10-CM | POA: Diagnosis not present

## 2016-04-15 DIAGNOSIS — K2970 Gastritis, unspecified, without bleeding: Secondary | ICD-10-CM | POA: Diagnosis not present

## 2016-04-15 DIAGNOSIS — R110 Nausea: Secondary | ICD-10-CM | POA: Diagnosis not present

## 2016-04-15 LAB — COMPREHENSIVE METABOLIC PANEL
ALT: 17 U/L (ref 14–54)
AST: 25 U/L (ref 15–41)
Albumin: 4.2 g/dL (ref 3.5–5.0)
Alkaline Phosphatase: 85 U/L (ref 38–126)
Anion gap: 11 (ref 5–15)
BUN: 11 mg/dL (ref 6–20)
CO2: 24 mmol/L (ref 22–32)
Calcium: 10 mg/dL (ref 8.9–10.3)
Chloride: 107 mmol/L (ref 101–111)
Creatinine, Ser: 1.15 mg/dL — ABNORMAL HIGH (ref 0.44–1.00)
GFR calc Af Amer: 53 mL/min — ABNORMAL LOW (ref >60)
GFR calc non Af Amer: 46 mL/min — ABNORMAL LOW (ref >60)
Glucose, Bld: 95 mg/dL (ref 65–99)
Potassium: 3.7 mmol/L (ref 3.5–5.1)
Sodium: 142 mmol/L (ref 135–145)
Total Bilirubin: 0.7 mg/dL (ref 0.3–1.2)
Total Protein: 8.2 g/dL — ABNORMAL HIGH (ref 6.5–8.1)

## 2016-04-15 LAB — CBC
HCT: 47.5 % — ABNORMAL HIGH (ref 35.0–47.0)
Hemoglobin: 16.1 g/dL — ABNORMAL HIGH (ref 12.0–16.0)
MCH: 29.8 pg (ref 26.0–34.0)
MCHC: 33.8 g/dL (ref 32.0–36.0)
MCV: 88.2 fL (ref 80.0–100.0)
Platelets: 161 10*3/uL (ref 150–440)
RBC: 5.38 MIL/uL — ABNORMAL HIGH (ref 3.80–5.20)
RDW: 14 % (ref 11.5–14.5)
WBC: 9.9 10*3/uL (ref 3.6–11.0)

## 2016-04-15 LAB — URINALYSIS COMPLETE WITH MICROSCOPIC (ARMC ONLY)
Bilirubin Urine: NEGATIVE
Glucose, UA: NEGATIVE mg/dL
Hgb urine dipstick: NEGATIVE
Ketones, ur: NEGATIVE mg/dL
Nitrite: NEGATIVE
Protein, ur: 30 mg/dL — AB
Specific Gravity, Urine: 1.003 — ABNORMAL LOW (ref 1.005–1.030)
pH: 7 (ref 5.0–8.0)

## 2016-04-15 LAB — LIPASE, BLOOD: Lipase: 19 U/L (ref 11–51)

## 2016-04-15 MED ORDER — SODIUM CHLORIDE 0.9 % IV BOLUS (SEPSIS)
1000.0000 mL | Freq: Once | INTRAVENOUS | Status: AC
  Administered 2016-04-15: 1000 mL via INTRAVENOUS

## 2016-04-15 MED ORDER — DICYCLOMINE HCL 10 MG/ML IM SOLN
20.0000 mg | Freq: Once | INTRAMUSCULAR | Status: AC
  Administered 2016-04-16: 20 mg via INTRAMUSCULAR
  Filled 2016-04-15: qty 2

## 2016-04-15 MED ORDER — HYDROMORPHONE HCL 1 MG/ML IJ SOLN
1.0000 mg | Freq: Once | INTRAMUSCULAR | Status: AC
  Administered 2016-04-15: 1 mg via INTRAVENOUS
  Filled 2016-04-15: qty 1

## 2016-04-15 MED ORDER — ONDANSETRON HCL 4 MG/2ML IJ SOLN
4.0000 mg | Freq: Once | INTRAMUSCULAR | Status: AC
  Administered 2016-04-15: 4 mg via INTRAVENOUS
  Filled 2016-04-15: qty 2

## 2016-04-15 MED ORDER — IOPAMIDOL (ISOVUE-370) INJECTION 76%
100.0000 mL | Freq: Once | INTRAVENOUS | Status: AC | PRN
  Administered 2016-04-15: 100 mL via INTRAVENOUS

## 2016-04-15 NOTE — ED Triage Notes (Signed)
Pt to ED via EMS for abd pain since 1400 today with nausea and vomiting. Pt received '4mg'$  zofran en route, VS stable per EMS. Pt c/o intermit abd pain.

## 2016-04-15 NOTE — ED Notes (Signed)
Pt up to the bathroom unsteadt gait.

## 2016-04-15 NOTE — ED Notes (Signed)
ED Provider at bedside. 

## 2016-04-15 NOTE — ED Provider Notes (Addendum)
[facility] Emergency Department Provider Note    ____________________________________________   I have reviewed the triage vital signs and the nursing notes.   HISTORY  Chief Complaint Abdominal Pain and Emesis   History limited by: Not Limited   HPI [name] is a 75 y.o. female who presents to the emergency department today because of concerns for abdominal pain. The patient states that the abdominal pain started earlier this morning. Initially it was not very severe however around 2:30 this afternoon became severe. She describes it as being located in the suprapubic region. She describes it as a tearing sensation. She does have a history of an ostomy and reversal and feels like something is torn loose. She has not had any bowel movements today is unsure if she has had any flatulence. The patient has had nausea and vomiting. No fevers.   Past Medical History:  Diagnosis Date  . Anemia   . B12 deficiency   . Bladder incontinence   . Blind left eye   . Cataract   . Cervical spine fracture ([place])   . Compression fracture    C7,  upper T spine -compression fx  . COPD (chronic obstructive pulmonary disease) ([place])   . DDD (degenerative disc disease), lumbar   . Depression    major  . GERD (gastroesophageal reflux disease)   . Hiatal hernia   . Hyperlipidemia   . Hypertension   . Macular degeneration   . Osteoarthritis   . Osteoporosis   . Reactive airway disease   . Rupture of bowel ([place])   . Stroke [facility])    "mini stroke"  . Stroke [facility])     Patient Active Problem List   Diagnosis Date Noted  . Spinal stenosis in cervical region 06/27/2015  . Syncope 05/22/2015  . Diffuse myofascial pain syndrome 03/24/2015  . Sepsis ([place]) 10/08/2014  . Bacteremia 10/07/2014  . Leukopenia 10/07/2014  . Thrombocytopenia ([place]) 10/07/2014  . Fever 10/05/2014  . Hypoxia 10/05/2014  . HLD (hyperlipidemia) 07/30/2013  . HTN (hypertension) 07/30/2013   . GERD (gastroesophageal reflux disease) 07/30/2013  . Chest pain 07/29/2013  . Dizziness and giddiness 06/18/2013  . Gait difficulty 05/03/2013  . Memory loss 05/03/2013    Past Surgical History:  Procedure Laterality Date  . ABDOMINAL HYSTERECTOMY    . ABDOMINAL SURGERY    . APPENDECTOMY    . BLADDER SURGERY    . CHOLECYSTECTOMY    . COLON SURGERY    . EYE SURGERY     cataracts  . JOINT REPLACEMENT    . KNEE SURGERY    . OSTOMY    . RECTOCELE REPAIR      Prior to Admission medications   Medication Sig Start Date End Date Taking? Authorizing Provider  acetaminophen (TYLENOL) 325 MG tablet Take 2 tablets (650 mg total) by mouth every 6 (six) hours as needed for mild pain (or Fever >/= 101). 05/24/15   Geradine Girt, DO  albuterol (PROVENTIL HFA;VENTOLIN HFA) 108 (90 BASE) MCG/ACT inhaler Inhale 1 puff into the lungs every 6 (six) hours as needed for wheezing or shortness of breath.    Historical Provider, MD  albuterol (PROVENTIL) (2.5 MG/3ML) 0.083% nebulizer solution Take 2.5 mg by nebulization every 6 (six) hours as needed for wheezing or shortness of breath.    Historical Provider, MD  alendronate (FOSAMAX) 70 MG tablet Take 1 tablet by mouth once a week. 03/04/16   Historical Provider, MD  ALPRAZolam Duanne Moron) 1 MG  tablet Take 1 tablet (1 mg total) by mouth at bedtime as needed for anxiety. Patient taking differently: Take 1 mg by mouth at bedtime.  05/24/15   Geradine Girt, DO  amLODipine (NORVASC) 5 MG tablet Take 5 mg by mouth daily.  04/06/13   Historical Provider, MD  azithromycin (ZITHROMAX Z-PAK) 250 MG tablet Take 1 tablet (250 mg total) by mouth daily. 2 pills ('500mg'$ ) day 1 then 1 pill ('250mg'$ ) days 2-5. 01/23/16   Virgel Manifold, MD  citalopram (CELEXA) 20 MG tablet Take 20 mg by mouth daily.    Historical Provider, MD  DOCUSATE SODIUM PO Take 1 capsule by mouth daily.    Historical Provider, MD  esomeprazole (NEXIUM) 40 MG capsule Take 40 mg by mouth daily.      Historical Provider, MD  gabapentin (NEURONTIN) 400 MG capsule TAKE 1 CAPSULE BY MOUTH FOUR TIMES A DAY 11/24/15   Charlett Blake, MD  HYDROcodone-acetaminophen [facility]) 10-325 MG tablet Take 1 tablet by mouth 2 (two) times daily. 03/29/16   Charlett Blake, MD  IRON PO Take 1 tablet by mouth every other day.     Historical Provider, MD  KRILL OIL PO Take 1 tablet by mouth daily.    Historical Provider, MD  lovastatin (MEVACOR) 10 MG tablet Take 10 mg by mouth daily. 09/22/14   Historical Provider, MD  meclizine (ANTIVERT) 12.5 MG tablet Take 12.5 mg by mouth 3 (three) times daily as needed for nausea.    Historical Provider, MD  meloxicam (MOBIC) 15 MG tablet Take 1 tablet by mouth daily. 03/25/16   Historical Provider, MD  ondansetron (ZOFRAN) 4 MG tablet  02/29/16   Historical Provider, MD  polyethylene glycol powder (GLYCOLAX/MIRALAX) powder Take 17 g by mouth daily. For constipation    Historical Provider, MD  predniSONE (DELTASONE) 20 MG tablet Take 2 tablets (40 mg total) by mouth daily. 01/23/16   Virgel Manifold, MD  senna (SENOKOT) 8.6 MG tablet Take 1-2 tablets by mouth daily.     Historical Provider, MD  traZODone (DESYREL) 50 MG tablet Take 0.5 tablets (25 mg total) by mouth at bedtime. 12/30/14   Penni Bombard, MD  vitamin B-12 (CYANOCOBALAMIN) 1000 MCG tablet Take 1,000 mcg by mouth daily.    Historical Provider, MD    Allergies Ampicillin-sulbactam sodium; Ampicillin; Toradol [ketorolac tromethamine]; Tramadol; Ambien [zolpidem tartrate]; Cephalexin; Ketorolac; Percocet [oxycodone-acetaminophen]; Sulfamethoxazole-trimethoprim; and Tape  Family History  Problem Relation Age of Onset  . Heart failure Mother   . Heart attack Father     Social History Social History  Substance Use Topics  . Smoking status: Former Smoker    Packs/day: 1.50    Years: 30.00  . Smokeless tobacco: Never Used     Comment: quit 2013  . Alcohol use No    Review of Systems  Constitutional:  Negative for fever. Cardiovascular: Negative for chest pain. Respiratory: Negative for shortness of breath. Gastrointestinal: Positive for abdominal pain and vomiting. Genitourinary: Negative for dysuria. Musculoskeletal: Negative for back pain. Skin: Negative for rash. Neurological: Negative for headaches, focal weakness or numbness.  10-point ROS otherwise negative.  ____________________________________________   PHYSICAL EXAM:  VITAL SIGNS: ED Triage Vitals  Enc Vitals Group     BP 04/15/16 2109 (!) 182/97     Pulse Rate 04/15/16 2109 94     Resp 04/15/16 2109 18     Temp 04/15/16 2109 98.4 F (36.9 C)     Temp Source 04/15/16 2109 Oral  SpO2 04/15/16 2109 94 %     Weight --      Height --      Head Circumference --      Peak Flow --      Pain Score 04/15/16 2110 9   Constitutional: Alert and oriented. Appears uncomfortable.  Eyes: Conjunctivae are normal. Normal extraocular movements. ENT   Head: Normocephalic and atraumatic.   Nose: No congestion/rhinnorhea.   Mouth/Throat: Mucous membranes are moist.   Neck: No stridor. Hematological/Lymphatic/Immunilogical: No cervical lymphadenopathy. Cardiovascular: Normal rate, regular rhythm.  No murmurs, rubs, or gallops. Respiratory: Normal respiratory effort without tachypnea nor retractions. Breath sounds are clear and equal bilaterally. No wheezes/rales/rhonchi. Gastrointestinal: Soft. Tender to palpation in the lower abdomen.  Genitourinary: Deferred Musculoskeletal: Normal range of motion in all extremities. No lower extremity edema. Neurologic:  Normal speech and language. No gross focal neurologic deficits are appreciated.  Skin:  Skin is warm, dry and intact. No rash noted. Psychiatric: Mood and affect are normal. Speech and behavior are normal. Patient exhibits appropriate insight and judgment.  ____________________________________________    LABS (pertinent positives/negatives)  Labs Reviewed   COMPREHENSIVE METABOLIC PANEL - Abnormal; Notable for the following:       Result Value   Creatinine, Ser 1.15 (*)    Total Protein 8.2 (*)    GFR calc non Af Amer 46 (*)    GFR calc Af Amer 53 (*)    All other components within normal limits  CBC - Abnormal; Notable for the following:    RBC 5.38 (*)    Hemoglobin 16.1 (*)    HCT 47.5 (*)    All other components within normal limits  URINALYSIS COMPLETEWITH MICROSCOPIC ([facility] ONLY) - Abnormal; Notable for the following:    Color, Urine STRAW (*)    APPearance CLEAR (*)    Specific Gravity, Urine 1.003 (*)    Protein, ur 30 (*)    Leukocytes, UA 1+ (*)    Bacteria, UA RARE (*)    Squamous Epithelial / LPF 0-5 (*)    All other components within normal limits  URINE CULTURE  LIPASE, BLOOD     ____________________________________________   EKG  I, Nance Pear, attending physician, personally viewed and interpreted this EKG  EKG Time: 2112 Rate: 76 Rhythm: normal sinus arrhythmia with PAC Axis: normal Intervals: qtc 470 QRS: narrow, q waves V1, LVH ST changes: no st elevation Impression: abnormal ekg   ____________________________________________    RADIOLOGY  CT angio   IMPRESSION:  No evidence of thoracic or abdominal aortic dissection or aneurysm.  Calcified granulomas. Small ventral abdominal wall hernia containing  fat.   ____________________________________________   PROCEDURES  Procedures  ____________________________________________   INITIAL IMPRESSION / ASSESSMENT AND PLAN / ED COURSE  Pertinent labs & imaging results that were available during my care of the patient were reviewed by me and considered in my medical decision making (see chart for details).  Patient here with abdominal pain. On exam patient extremely uncomfortable, describes a tearing sensation. Given level of pain due have concern for vascular pathology. Will obtain CT angio.  Clinical Course    CT angio without  any concerning findings. Patient's UA with findings consistent with UTI. Patient felt better after bentyl. Will plan on giving patient dose of IV abx here and prescription for antibiotics. Did discuss with patient finding of adrenal nodule. ____________________________________________   FINAL CLINICAL IMPRESSION(S) / ED DIAGNOSES  Final diagnoses:  Abdominal pain, unspecified abdominal location  Urinary tract  infection without hematuria, site unspecified     Note: This dictation was prepared with Dragon dictation. Any transcriptional errors that result from this process are unintentional    Nance Pear, MD 04/16/16 [place], MD 04/23/16 2348

## 2016-04-15 NOTE — ED Notes (Signed)
Patient transported to CT 

## 2016-04-15 NOTE — ED Notes (Signed)
Pt reports itching after hydromorphone Dr. Archie Balboa made aware of pt requesting benadryl.

## 2016-04-16 DIAGNOSIS — N390 Urinary tract infection, site not specified: Secondary | ICD-10-CM | POA: Diagnosis not present

## 2016-04-16 MED ORDER — CIPROFLOXACIN HCL 500 MG PO TABS: 500.0000 mg | ORAL_TABLET | Freq: Two times a day (BID) | ORAL | 0 refills | Status: AC

## 2016-04-16 MED ORDER — PHENAZOPYRIDINE HCL 200 MG PO TABS: 200.0000 mg | ORAL_TABLET | Freq: Three times a day (TID) | ORAL | 0 refills | Status: DC | PRN

## 2016-04-16 MED ORDER — CIPROFLOXACIN IN D5W 400 MG/200ML IV SOLN
400.0000 mg | Freq: Once | INTRAVENOUS | Status: AC
  Administered 2016-04-16: 400 mg via INTRAVENOUS
  Filled 2016-04-16: qty 200

## 2016-04-16 NOTE — Discharge Instructions (Addendum)
As discussed you should have a follow up adrenal protocol CT done in 1-2 years - please discuss scheduling this with your primary care doctor. Please seek medical attention for any high fevers, chest pain, shortness of breath, change in behavior, persistent vomiting, bloody stool or any other new or concerning symptoms.

## 2016-04-16 NOTE — ED Notes (Signed)
ED Provider at bedside talking to patient and family.

## 2016-04-16 NOTE — ED Notes (Signed)
Discharge instructions reviewed with patient. Questions fielded by this RN. Patient verbalizes understanding of instructions. Patient discharged home in stable condition per Archie Balboa MD . No acute distress noted at time of discharge.

## 2016-04-17 LAB — URINE CULTURE: Culture: <10000 — AB

## 2016-04-22 ENCOUNTER — Ambulatory Visit: Payer: Medicare Other | Admitting: Physical Medicine & Rehabilitation

## 2016-04-28 IMAGING — CR DG CHEST 2V
2 series · 2 of 2 positions shown · non-contrast
Comparison: None.

CLINICAL DATA: When 4 weeks of cough and congestion, smoker.

EXAM:
CHEST  2 VIEW

[w chest pa]
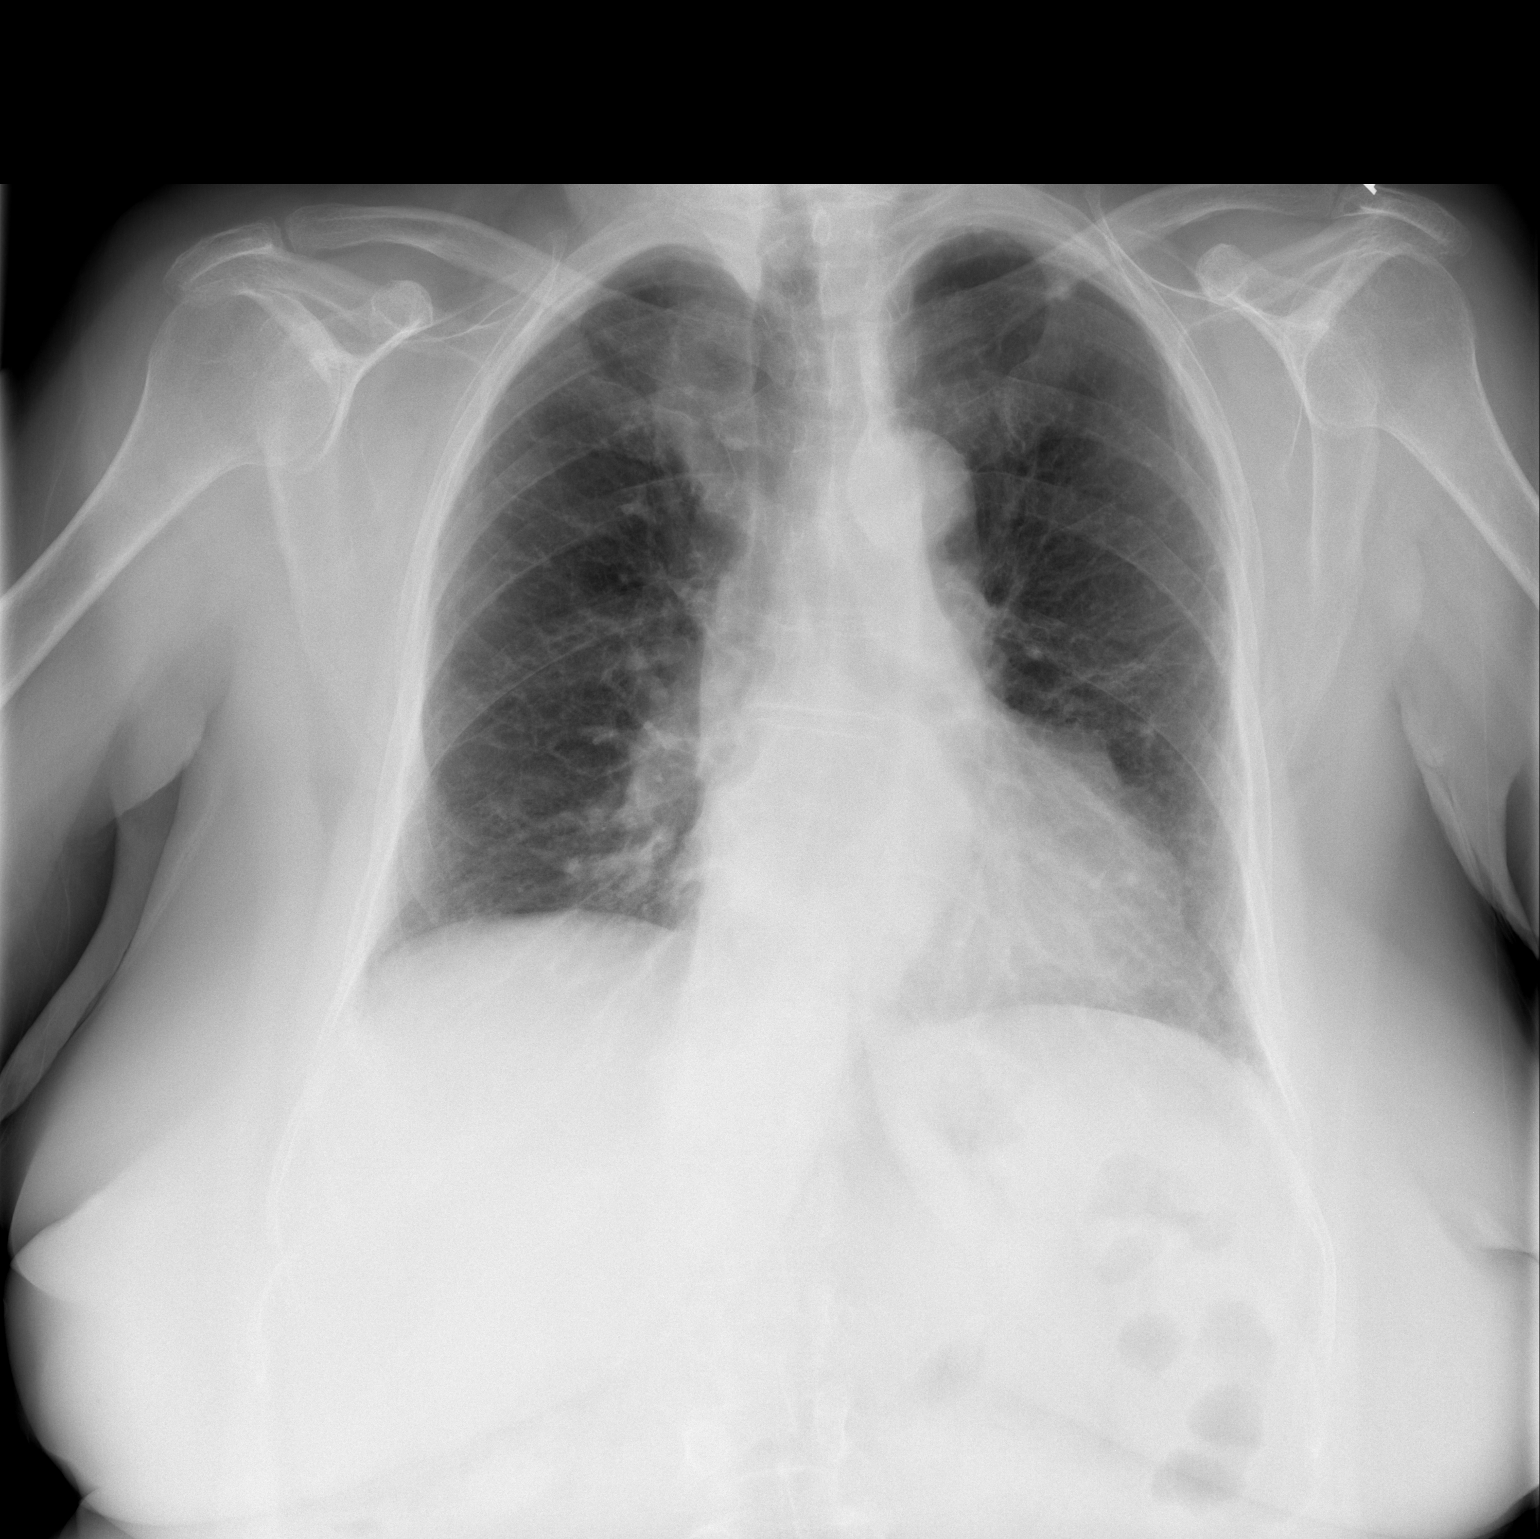

[w chest lat]
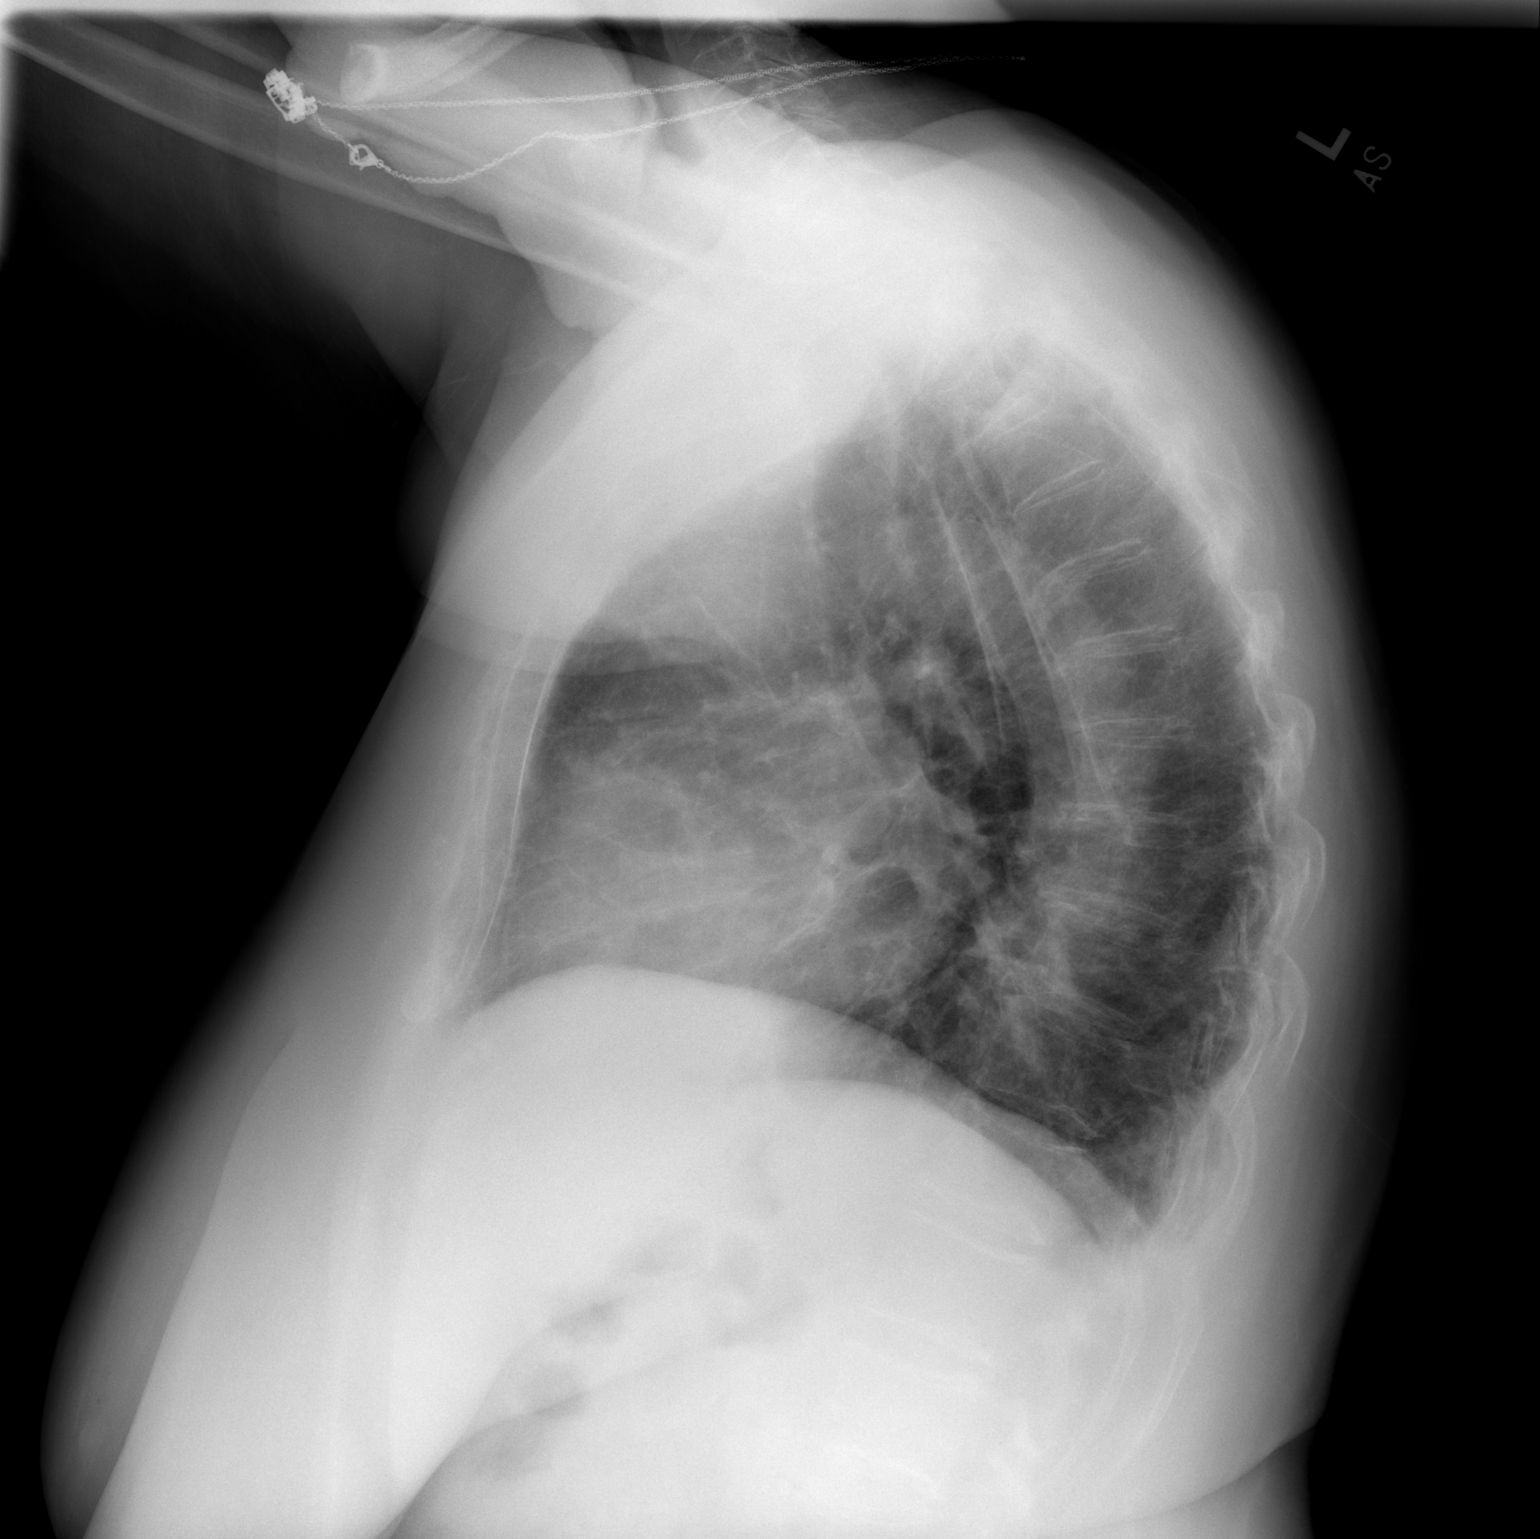

[2 of 2 positions shown; findings below may reference images not displayed]

FINDINGS: The lungs are adequately inflated. The interstitial markings are
coarse. There is a subcentimeter calcified nodule projecting over
the anterior aspect of the left first rib. There is soft tissue
density in the right paratracheal region which likely reflects
vascular structures. The cardiac silhouette is top-normal in size.
The pulmonary vascularity is normal. There is no pleural effusion.
There is tortuosity of the descending thoracic aorta. There is
gentle reverse S-shaped thoracolumbar scoliosis.
IMPRESSION: There is no focal pneumonia nor CHF. Coarse interstitial lung
markings likely reflect the patient's smoking history or
superimposed acute bronchitis. Follow-up radiographs including an
apical lordotic view are recommended following anticipated
antibiotic therapy to allow reassessment of the right paratracheal
region as well as the apparently calcified left upper lobe nodule.

## 2016-04-30 ENCOUNTER — Encounter: Payer: Self-pay | Admitting: Physical Medicine & Rehabilitation

## 2016-04-30 ENCOUNTER — Encounter: Payer: Medicare Other | Attending: Physical Medicine & Rehabilitation

## 2016-04-30 ENCOUNTER — Ambulatory Visit (HOSPITAL_BASED_OUTPATIENT_CLINIC_OR_DEPARTMENT_OTHER): Payer: Medicare Other | Admitting: Physical Medicine & Rehabilitation

## 2016-04-30 ENCOUNTER — Ambulatory Visit: Payer: Medicare Other | Admitting: Physical Therapy

## 2016-04-30 VITALS — BP 144/83 | HR 66 | Resp 14

## 2016-04-30 DIAGNOSIS — S63502A Unspecified sprain of left wrist, initial encounter: Secondary | ICD-10-CM | POA: Diagnosis not present

## 2016-04-30 DIAGNOSIS — D649 Anemia, unspecified: Secondary | ICD-10-CM | POA: Insufficient documentation

## 2016-04-30 DIAGNOSIS — I10 Essential (primary) hypertension: Secondary | ICD-10-CM | POA: Diagnosis not present

## 2016-04-30 DIAGNOSIS — M25512 Pain in left shoulder: Secondary | ICD-10-CM | POA: Insufficient documentation

## 2016-04-30 DIAGNOSIS — M5136 Other intervertebral disc degeneration, lumbar region: Secondary | ICD-10-CM | POA: Insufficient documentation

## 2016-04-30 DIAGNOSIS — H3530 Unspecified macular degeneration: Secondary | ICD-10-CM | POA: Diagnosis not present

## 2016-04-30 DIAGNOSIS — S12600A Unspecified displaced fracture of seventh cervical vertebra, initial encounter for closed fracture: Secondary | ICD-10-CM | POA: Diagnosis not present

## 2016-04-30 DIAGNOSIS — M797 Fibromyalgia: Secondary | ICD-10-CM | POA: Insufficient documentation

## 2016-04-30 DIAGNOSIS — J449 Chronic obstructive pulmonary disease, unspecified: Secondary | ICD-10-CM | POA: Diagnosis not present

## 2016-04-30 DIAGNOSIS — F329 Major depressive disorder, single episode, unspecified: Secondary | ICD-10-CM | POA: Diagnosis not present

## 2016-04-30 DIAGNOSIS — K219 Gastro-esophageal reflux disease without esophagitis: Secondary | ICD-10-CM | POA: Diagnosis not present

## 2016-04-30 DIAGNOSIS — M1990 Unspecified osteoarthritis, unspecified site: Secondary | ICD-10-CM | POA: Diagnosis not present

## 2016-04-30 DIAGNOSIS — M7918 Myalgia, other site: Secondary | ICD-10-CM

## 2016-04-30 DIAGNOSIS — F17210 Nicotine dependence, cigarettes, uncomplicated: Secondary | ICD-10-CM | POA: Diagnosis not present

## 2016-04-30 DIAGNOSIS — E785 Hyperlipidemia, unspecified: Secondary | ICD-10-CM | POA: Diagnosis not present

## 2016-04-30 DIAGNOSIS — Z79891 Long term (current) use of opiate analgesic: Secondary | ICD-10-CM | POA: Diagnosis not present

## 2016-04-30 DIAGNOSIS — I639 Cerebral infarction, unspecified: Secondary | ICD-10-CM | POA: Diagnosis not present

## 2016-04-30 DIAGNOSIS — M810 Age-related osteoporosis without current pathological fracture: Secondary | ICD-10-CM | POA: Insufficient documentation

## 2016-04-30 DIAGNOSIS — M791 Myalgia: Secondary | ICD-10-CM

## 2016-04-30 DIAGNOSIS — S22022A Unstable burst fracture of second thoracic vertebra, initial encounter for closed fracture: Secondary | ICD-10-CM | POA: Diagnosis not present

## 2016-04-30 DIAGNOSIS — M47812 Spondylosis without myelopathy or radiculopathy, cervical region: Secondary | ICD-10-CM | POA: Diagnosis not present

## 2016-04-30 MED ORDER — HYDROCODONE-ACETAMINOPHEN 7.5-325 MG PO TABS: 1.0000 | ORAL_TABLET | Freq: Three times a day (TID) | ORAL | 0 refills | Status: DC

## 2016-04-30 NOTE — Patient Instructions (Signed)
Have reduced norco dose but increased from 2tab to 3 per day

## 2016-04-30 NOTE — Progress Notes (Signed)
Subjective:    Patient ID: [name], female    DOB: 04/01/1941, 75 y.o.   MRN: [id number]  HPI  Seen in ED 11/20 for low abd pain , Diagnosed with urinary tract infection Low back pain increased with standing  Left knee MCL strain seen by ortho rec knee brace , Playmaker, however, patient is not wearing this today. Daughter feels that the patient has had some increasing confusion. Needs more help with her medications. Patient is taking hydrocodone 10 mg twice a day, but feels like her back pain wakes her up at night and she needs another tablet at night. Pain Inventory Average Pain 9 Pain Right Now 9 My pain is sharp  In the last 24 hours, has pain interfered with the following? General activity 10 Relation with others n/a Enjoyment of life n/a What TIME of day is your pain at its worst? all Sleep (in general) Poor  Pain is worse with: walking, bending, sitting, inactivity and standing Pain improves with: medication Relief from Meds: 6  Mobility walk with assistance how many minutes can you walk? none ability to climb steps?  no do you drive?  no  Function retired I need assistance with the following:  feeding, dressing, bathing, toileting, meal prep, household duties and shopping Do you have any goals in this area?  no  Neuro/Psych No problems in this area  Prior Studies Any changes since last visit?  no  Physicians involved in your care Any changes since last visit?  no   Family History  Problem Relation Age of Onset  . Heart failure Mother   . Heart attack Father    Social History   Social History  . Marital status: Widowed    Spouse name: N/A  . Number of children: 3  . Years of education: middle sch   Occupational History  . retired     n/a   Social History Main Topics  . Smoking status: Former Smoker    Packs/day: 1.50    Years: 30.00  . Smokeless tobacco: Never Used     Comment: quit 2013  . Alcohol use No  . Drug use: No  .  Sexual activity: No   Other Topics Concern  . None   Social History Narrative   ** Merged History Encounter **       Patient lives at home with daughter. Caffeine Use: 4 cups daily   Past Surgical History:  Procedure Laterality Date  . ABDOMINAL HYSTERECTOMY    . ABDOMINAL SURGERY    . APPENDECTOMY    . BLADDER SURGERY    . CHOLECYSTECTOMY    . COLON SURGERY    . EYE SURGERY     cataracts  . JOINT REPLACEMENT    . KNEE SURGERY    . OSTOMY    . RECTOCELE REPAIR     Past Medical History:  Diagnosis Date  . Anemia   . B12 deficiency   . Bladder incontinence   . Blind left eye   . Cataract   . Cervical spine fracture ([place])   . Compression fracture    C7,  upper T spine -compression fx  . COPD (chronic obstructive pulmonary disease) ([place])   . DDD (degenerative disc disease), lumbar   . Depression    major  . GERD (gastroesophageal reflux disease)   . Hiatal hernia   . Hyperlipidemia   . Hypertension   . Macular degeneration   . Osteoarthritis   . Osteoporosis   .  Reactive airway disease   . Rupture of bowel ([place])   . Stroke [facility])    "mini stroke"  . Stroke ([place])    BP (!) 144/83   Pulse 66   Resp 14   SpO2 92%   Opioid Risk [name]:   Fall Risk [name]:  `1  Depression screen PHQ 2/9  Depression screen PHQ 2/9 03/24/2015  Decreased Interest 2  Down, Depressed, Hopeless 3  PHQ - 2 [name] 5  Altered sleeping 3  Tired, decreased energy 3  Change in appetite 2  Feeling bad or failure about yourself  2  Trouble concentrating 1  Moving slowly or fidgety/restless 0  Suicidal thoughts 0  PHQ-9 [name] 16  Difficult doing work/chores Somewhat difficult     Review of Systems  Constitutional: Positive for chills.       Night sweats   HENT: Negative.   Eyes: Negative.   Respiratory: Positive for cough and wheezing.   Cardiovascular: Negative.   Gastrointestinal: Positive for diarrhea.  Endocrine: Negative.   Genitourinary: Negative.   Musculoskeletal:  Negative.   Skin: Negative.   Allergic/Immunologic: Negative.   Neurological: Negative.   Hematological: Negative.   Psychiatric/Behavioral: Negative.   All other systems reviewed and are negative.      Objective:   Physical Exam  Constitutional: She appears well-developed and well-nourished.  HENT:  Head: Normocephalic and atraumatic.  Eyes: Conjunctivae and EOM are normal. Pupils are equal, round, and reactive to light.  Nursing note and vitals reviewed.  Full knee range of motion Cervical spine range of motion is improved. She has full right-sided rotation and 75% left-sided full flexion and extension. Tenderness over the left trapezius and left parascapular muscles Oriented to self and place, not day,  Lumbar spine has some tenderness to palpation around L4-L5 area. She has reduced lumbar spine range of motion with flexion, extension, lateral bending and rotation.     Assessment & Plan:  1. Cervical stenosis without evidence of radiculopathy at the current time. 2. Cervical myofascial pain, remains tender over the trapezius area. Recommend heat as well as muscle cream 3. Chronic low back pain, last MRI 2015 demonstrating lumbar disc degeneration L3-4 as well as moderate facet arthropathy. There is some milder facet arthropathy at L4-5 as well. We'll change hydrocodone 10 mg 2. Hydrocodone 7.5 milligrams and increased from twice a day to 3 times a day If this is not helpful, would schedule for lumbar medial branch blocks L2, L3, L4 to cover the affected areas. . 4. 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 Follows with [facility] orthopedic department 5. Patient with confusion regarding medications, her daughter feels that this has been slowly progressive over time. Likely dementia, last CT of the head was 2016, which was normal. She has no focal neurologic signs at the current time. If this worsens, may need to see neurology once  again. We're balancing pain relief with potential cognitive side effects narcotic analgesics in the geriatric population.

## 2016-05-01 ENCOUNTER — Telehealth: Payer: Self-pay

## 2016-05-01 NOTE — Telephone Encounter (Signed)
Patients daughter called today, stated that patient has had her hydrocodone meds changed from '10mg'$  down to '5mg'$ , stated that new dosage is not helping pain at all, and is now causing constipation, requests that meds be changed back to '10mg'$ 

## 2016-05-02 ENCOUNTER — Encounter: Payer: Medicare Other | Admitting: Physical Therapy

## 2016-05-02 NOTE — Telephone Encounter (Signed)
Contacted pt's daughter explained the dosing change and why. Conversation switched to patient is itching because of medication change. I tried to rationalize that if the pt was itching on hydro/aceta 0.7-622 mg, they most certainly would have been itching on the 10-325 mg's as well.  Is there a solution for the itching?

## 2016-05-02 NOTE — Telephone Encounter (Signed)
May use Sarna lotion for itching

## 2016-05-02 NOTE — Telephone Encounter (Signed)
Pt was changed from Hydrocodone '10mg'$  BID to 7.5 mg TID due to pt waking up at night wanting another dose of pain med

## 2016-05-03 NOTE — Telephone Encounter (Signed)
Patient Daughter has been notified

## 2016-05-07 ENCOUNTER — Encounter: Payer: Medicare Other | Admitting: Physical Therapy

## 2016-05-09 ENCOUNTER — Encounter: Payer: Medicare Other | Admitting: Physical Therapy

## 2016-05-09 IMAGING — CR DG SHOULDER 2+V*L*
3 series · 3 of 3 positions shown · non-contrast
Comparison: None.

CLINICAL DATA: Left shoulder pain for 2 months, no known injury

EXAM:
LEFT SHOULDER - 2+ VIEW

[view not recorded (1 of 3)]
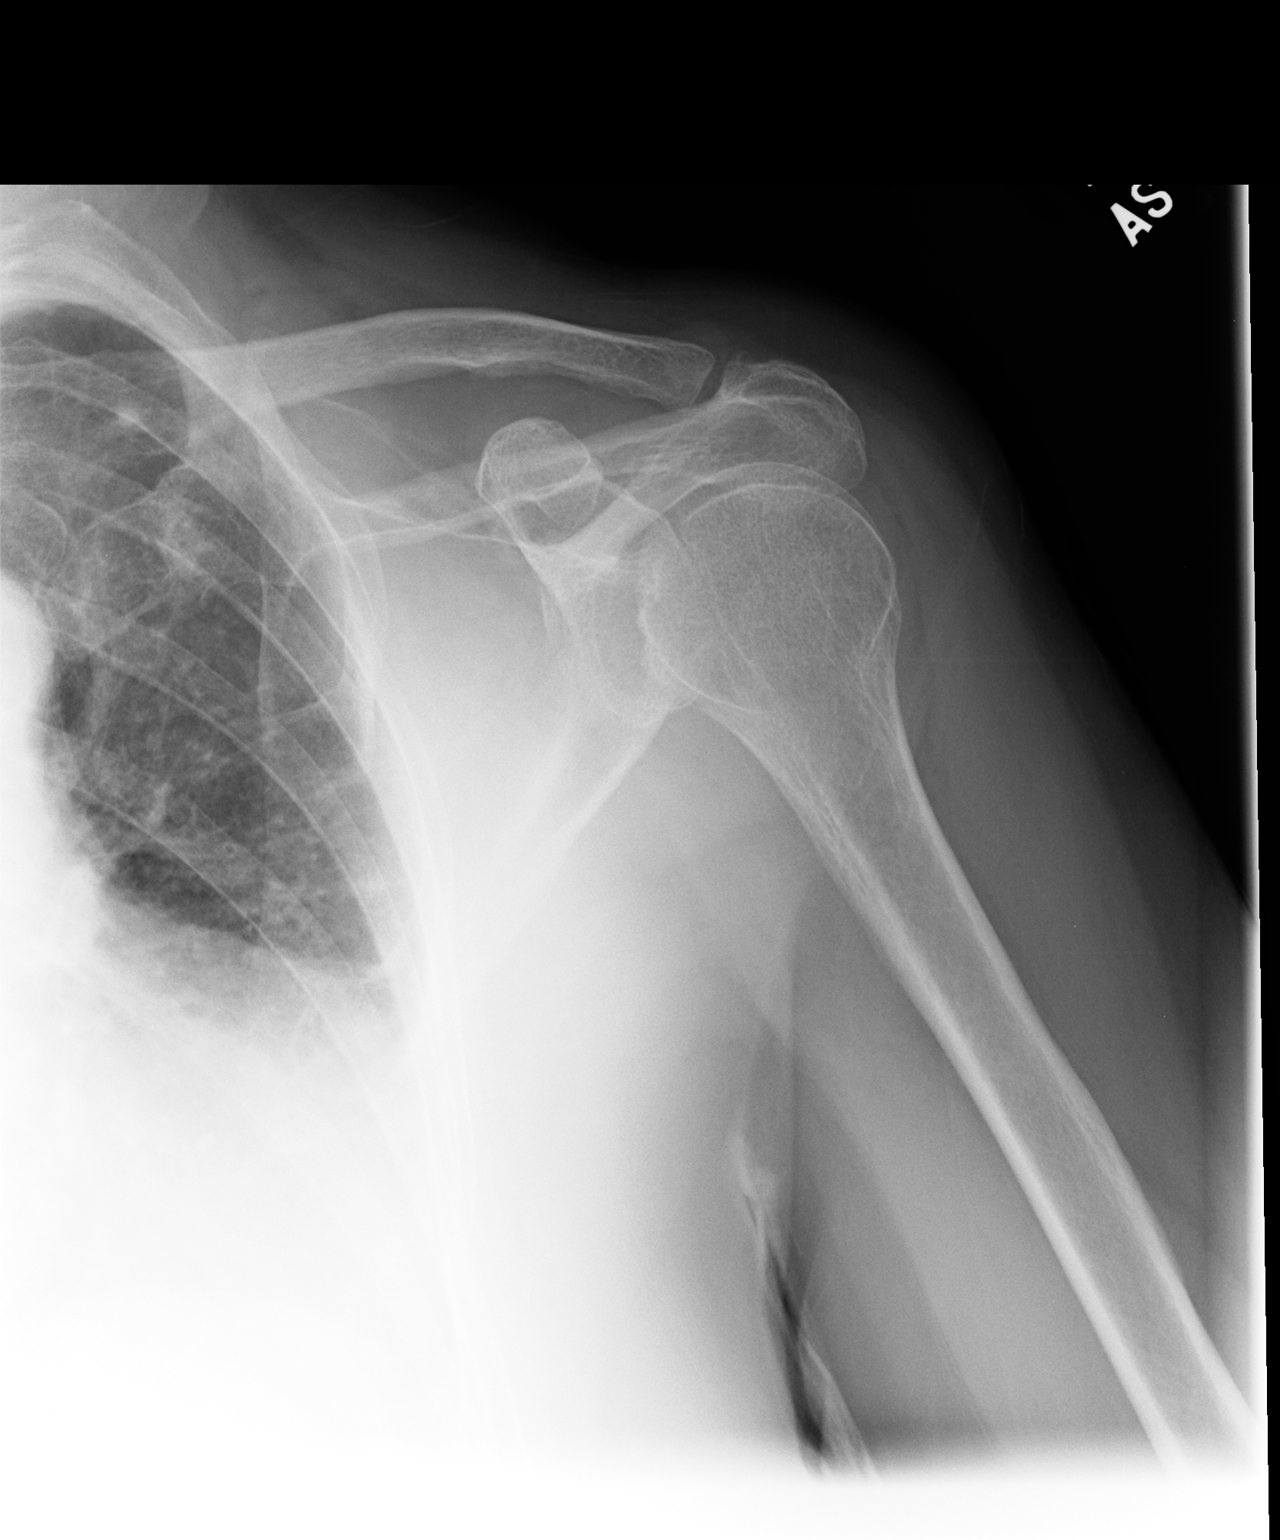

[view not recorded (2 of 3)]
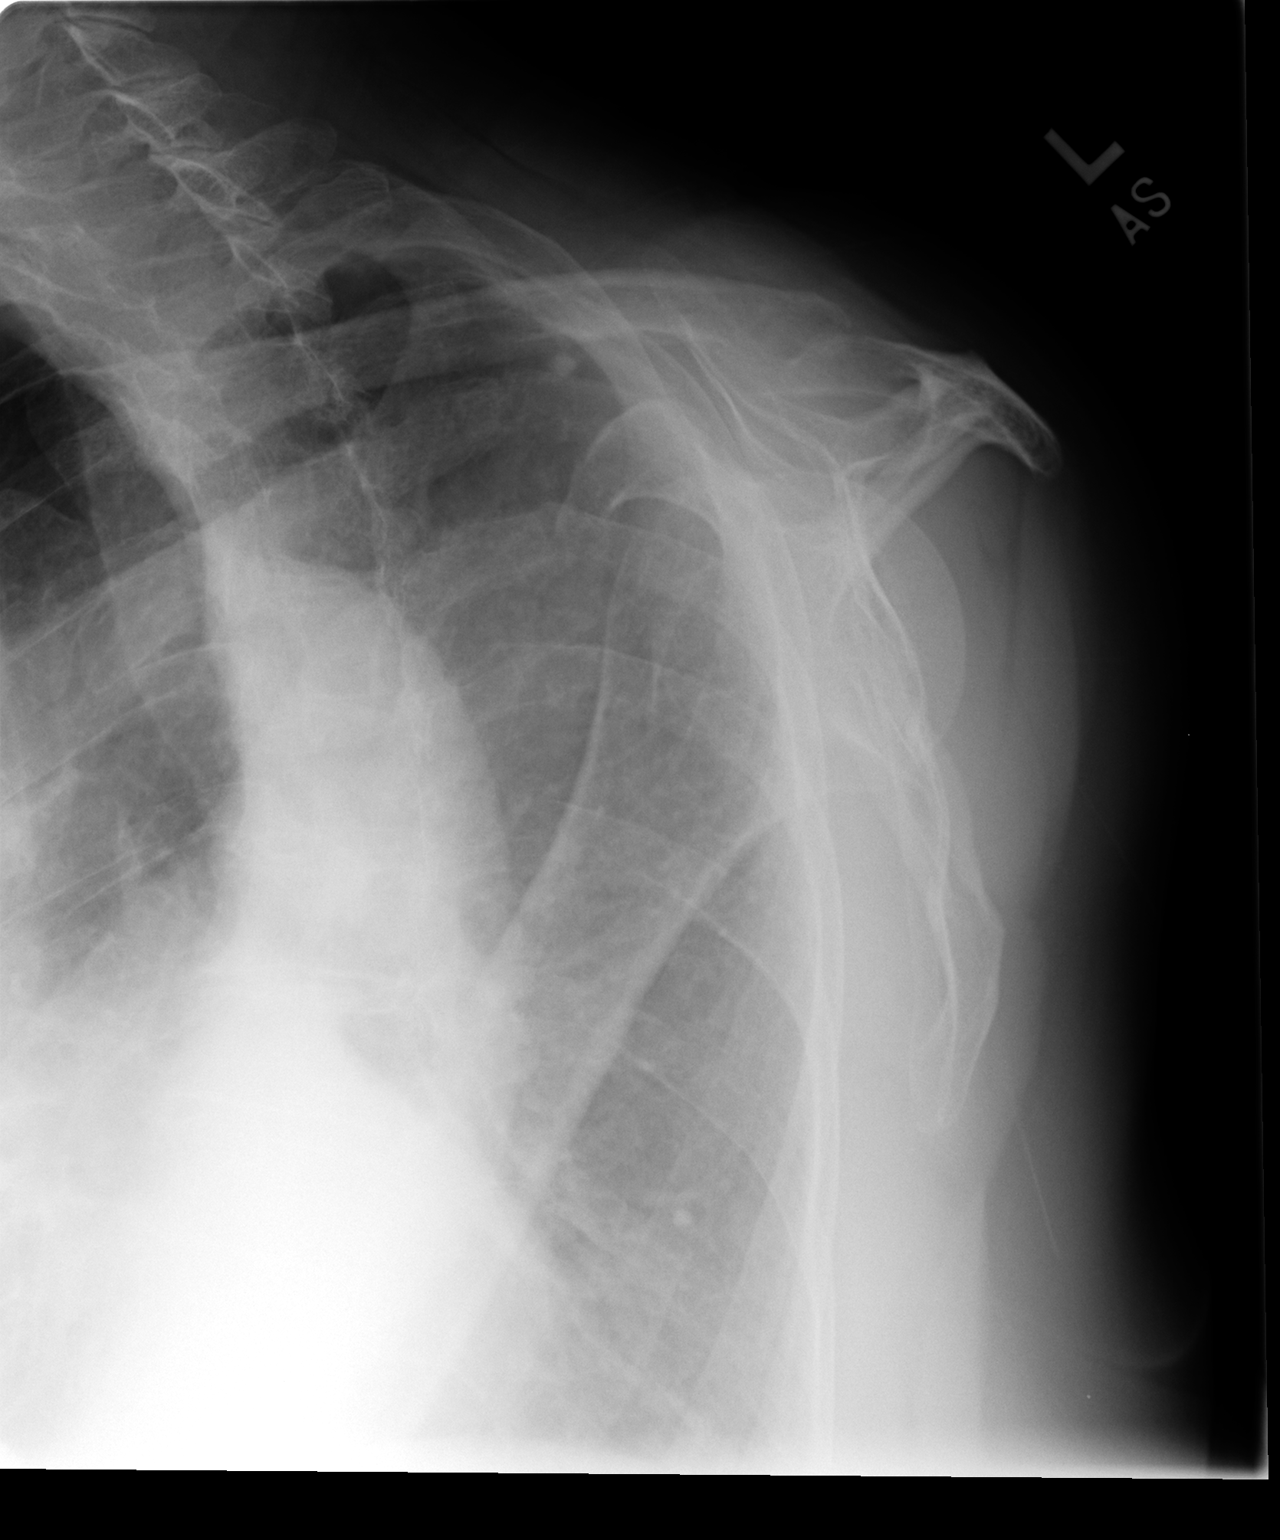

[view not recorded (3 of 3)]
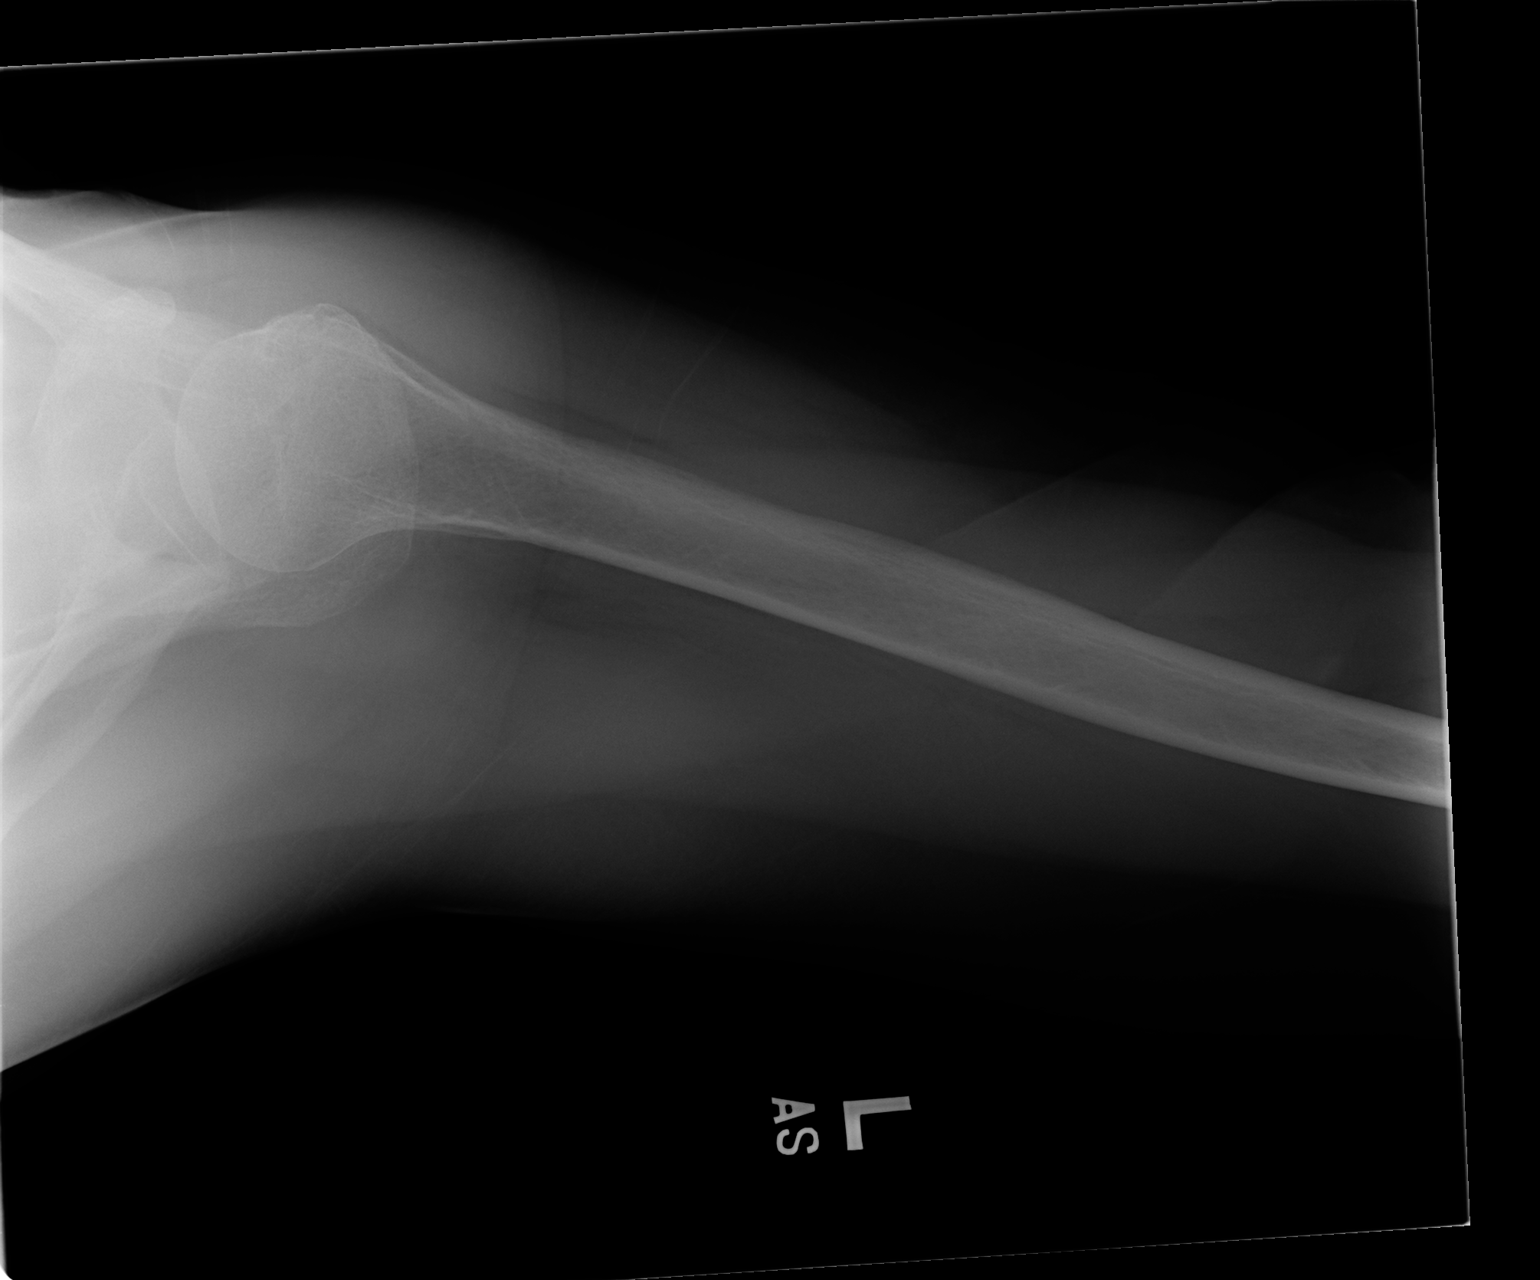

[3 of 3 positions shown; findings below may reference images not displayed]

FINDINGS: Three views of the left shoulder submitted. No acute fracture or
subluxation. Mild degenerative changes left AC joint. Mild narrowing
of glenohumeral joint space.
IMPRESSION: No acute fracture or subluxation.  Mild degenerative changes.

## 2016-05-14 ENCOUNTER — Encounter: Payer: Medicare Other | Admitting: Physical Therapy

## 2016-05-16 ENCOUNTER — Encounter: Payer: Medicare Other | Admitting: Physical Therapy

## 2016-05-23 ENCOUNTER — Encounter: Payer: Medicare Other | Admitting: Physical Therapy

## 2016-05-28 ENCOUNTER — Ambulatory Visit: Payer: Medicare Other | Admitting: Physical Therapy

## 2016-05-29 ENCOUNTER — Encounter: Payer: Medicare Other | Admitting: Registered Nurse

## 2016-05-30 ENCOUNTER — Encounter: Payer: Medicare Other | Admitting: Physical Therapy

## 2016-06-04 ENCOUNTER — Encounter: Payer: Medicare Other | Admitting: Physical Therapy

## 2016-06-05 ENCOUNTER — Encounter: Payer: Medicare Other | Attending: Physical Medicine & Rehabilitation | Admitting: Registered Nurse

## 2016-06-05 ENCOUNTER — Encounter: Payer: Self-pay | Admitting: Registered Nurse

## 2016-06-05 VITALS — BP 134/74 | HR 61

## 2016-06-05 DIAGNOSIS — G8929 Other chronic pain: Secondary | ICD-10-CM

## 2016-06-05 DIAGNOSIS — K219 Gastro-esophageal reflux disease without esophagitis: Secondary | ICD-10-CM | POA: Diagnosis not present

## 2016-06-05 DIAGNOSIS — M810 Age-related osteoporosis without current pathological fracture: Secondary | ICD-10-CM | POA: Insufficient documentation

## 2016-06-05 DIAGNOSIS — I639 Cerebral infarction, unspecified: Secondary | ICD-10-CM | POA: Diagnosis not present

## 2016-06-05 DIAGNOSIS — F17210 Nicotine dependence, cigarettes, uncomplicated: Secondary | ICD-10-CM | POA: Diagnosis not present

## 2016-06-05 DIAGNOSIS — M797 Fibromyalgia: Secondary | ICD-10-CM | POA: Diagnosis not present

## 2016-06-05 DIAGNOSIS — S22022A Unstable burst fracture of second thoracic vertebra, initial encounter for closed fracture: Secondary | ICD-10-CM | POA: Diagnosis not present

## 2016-06-05 DIAGNOSIS — J449 Chronic obstructive pulmonary disease, unspecified: Secondary | ICD-10-CM | POA: Diagnosis not present

## 2016-06-05 DIAGNOSIS — Z79899 Other long term (current) drug therapy: Secondary | ICD-10-CM

## 2016-06-05 DIAGNOSIS — M545 Low back pain, unspecified: Secondary | ICD-10-CM

## 2016-06-05 DIAGNOSIS — M1990 Unspecified osteoarthritis, unspecified site: Secondary | ICD-10-CM | POA: Insufficient documentation

## 2016-06-05 DIAGNOSIS — S63502A Unspecified sprain of left wrist, initial encounter: Secondary | ICD-10-CM | POA: Diagnosis not present

## 2016-06-05 DIAGNOSIS — M47812 Spondylosis without myelopathy or radiculopathy, cervical region: Secondary | ICD-10-CM | POA: Diagnosis not present

## 2016-06-05 DIAGNOSIS — S12600A Unspecified displaced fracture of seventh cervical vertebra, initial encounter for closed fracture: Secondary | ICD-10-CM | POA: Insufficient documentation

## 2016-06-05 DIAGNOSIS — F329 Major depressive disorder, single episode, unspecified: Secondary | ICD-10-CM | POA: Diagnosis not present

## 2016-06-05 DIAGNOSIS — M5136 Other intervertebral disc degeneration, lumbar region: Secondary | ICD-10-CM | POA: Insufficient documentation

## 2016-06-05 DIAGNOSIS — K5903 Drug induced constipation: Secondary | ICD-10-CM

## 2016-06-05 DIAGNOSIS — G894 Chronic pain syndrome: Secondary | ICD-10-CM

## 2016-06-05 DIAGNOSIS — Z5181 Encounter for therapeutic drug level monitoring: Secondary | ICD-10-CM

## 2016-06-05 DIAGNOSIS — H3530 Unspecified macular degeneration: Secondary | ICD-10-CM | POA: Insufficient documentation

## 2016-06-05 DIAGNOSIS — D649 Anemia, unspecified: Secondary | ICD-10-CM | POA: Diagnosis not present

## 2016-06-05 DIAGNOSIS — E785 Hyperlipidemia, unspecified: Secondary | ICD-10-CM | POA: Diagnosis not present

## 2016-06-05 DIAGNOSIS — Z79891 Long term (current) use of opiate analgesic: Secondary | ICD-10-CM | POA: Diagnosis not present

## 2016-06-05 DIAGNOSIS — M25512 Pain in left shoulder: Secondary | ICD-10-CM | POA: Diagnosis not present

## 2016-06-05 DIAGNOSIS — T402X5A Adverse effect of other opioids, initial encounter: Secondary | ICD-10-CM

## 2016-06-05 DIAGNOSIS — I10 Essential (primary) hypertension: Secondary | ICD-10-CM | POA: Diagnosis not present

## 2016-06-05 MED ORDER — HYDROCODONE-ACETAMINOPHEN 7.5-325 MG PO TABS: 1.0000 | ORAL_TABLET | Freq: Three times a day (TID) | ORAL | 0 refills | Status: DC

## 2016-06-05 NOTE — Progress Notes (Signed)
Subjective:    Patient ID: [name], female    DOB: May 15, 1941, 76 y.o.   MRN: [id number]  HPI: [name] is a 76 year old female who returns for follow up appointment and medication refill. She states her pain is located in her neck radiating into her left shoulder,lower-back and right knee. She rates her pain 8. Her current exercise regime is using light weights,walking and performing chair exercises.  Also states with change of Hydrocodone from 10/325 to 7.5/325 mg she has constipation. She has been compliant with Senna. Unable to have bowel movement. Movantik samples given she was educated on [place], with [name] and her [name], they verbalizes understanding.   Also staes the increase frequency of hydrocodone has not helped her low back pain, Dr. Letta Pate noted reviewed. We will schedule her for a MBB with Dr. Letta Pate, she verbalizes understanding.    Pain Inventory Average Pain 8 Pain Right Now 8 My pain is constant, sharp, burning, dull, stabbing, tingling and aching  In the last 24 hours, has pain interfered with the following? General activity 9 Relation with others 6 Enjoyment of life 9 What TIME of day is your pain at its worst? morning Sleep (in general) Poor  Pain is worse with: walking, standing and some activites Pain improves with: rest and medication Relief from Meds: 5  Mobility walk without assistance do you drive?  yes  Function retired  Neuro/Psych bladder control problems bowel control problems weakness trouble walking depression  Prior Studies Any changes since last visit?  no  Physicians involved in your care Any changes since last visit?  no   Family History  Problem Relation Age of Onset  . Heart failure Mother   . Heart attack Father    Social History   Social History  . Marital status: Widowed    Spouse name: N/A  . Number of children: 3  . Years of education: middle sch   Occupational  History  . retired     n/a   Social History Main Topics  . Smoking status: Former Smoker    Packs/day: 1.50    Years: 30.00  . Smokeless tobacco: Never Used     Comment: quit 2013  . Alcohol use No  . Drug use: No  . Sexual activity: No   Other Topics Concern  . Not on file   Social History Narrative   ** Merged History Encounter **       Patient lives at home with daughter. Caffeine Use: 4 cups daily   Past Surgical History:  Procedure Laterality Date  . ABDOMINAL HYSTERECTOMY    . ABDOMINAL SURGERY    . APPENDECTOMY    . BLADDER SURGERY    . CHOLECYSTECTOMY    . COLON SURGERY    . EYE SURGERY     cataracts  . JOINT REPLACEMENT    . KNEE SURGERY    . OSTOMY    . RECTOCELE REPAIR     Past Medical History:  Diagnosis Date  . Anemia   . B12 deficiency   . Bladder incontinence   . Blind left eye   . Cataract   . Cervical spine fracture ([place])   . Compression fracture    C7,  upper T spine -compression fx  . COPD (chronic obstructive pulmonary disease) ([place])   . DDD (degenerative disc disease), lumbar   . Depression    major  . GERD (gastroesophageal reflux disease)   .  Hiatal hernia   . Hyperlipidemia   . Hypertension   . Macular degeneration   . Osteoarthritis   . Osteoporosis   . Reactive airway disease   . Rupture of bowel ([place])   . Stroke [facility])    "mini stroke"  . Stroke [facility])    There were no vitals taken for this visit.  Opioid Risk [name]:   Fall Risk [name]:  `1  Depression screen PHQ 2/9  Depression screen PHQ 2/9 03/24/2015  Decreased Interest 2  Down, Depressed, Hopeless 3  PHQ - 2 [name] 5  Altered sleeping 3  Tired, decreased energy 3  Change in appetite 2  Feeling bad or failure about yourself  2  Trouble concentrating 1  Moving slowly or fidgety/restless 0  Suicidal thoughts 0  PHQ-9 [name] 16  Difficult doing work/chores Somewhat difficult   Review of Systems  HENT: Negative.   Eyes: Negative.   Respiratory: Negative.     Cardiovascular: Negative.   Gastrointestinal: Positive for nausea and vomiting.  Endocrine: Negative.   Genitourinary: Positive for difficulty urinating.  Musculoskeletal: Positive for gait problem.  Allergic/Immunologic: Negative.   Neurological: Positive for weakness and numbness.  Hematological: Negative.   Psychiatric/Behavioral: Positive for dysphoric mood. The patient is nervous/anxious.   All other systems reviewed and are negative.      Objective:   Physical Exam  Constitutional: She is oriented to person, place, and time. She appears well-developed and well-nourished.  HENT:  Head: Normocephalic and atraumatic.  Neck: Normal range of motion. Neck supple.  Cardiovascular: Normal rate and regular rhythm.   Pulmonary/Chest: Effort normal and breath sounds normal.  Musculoskeletal:  Normal Muscle Bulk and Muscle Testing Reveals: Upper Extremities: Full ROM and Muscle Strength 5/5 Thoracic Paraspinal Tenderness: T-1-T-3 Lumbar Paraspinal Tenderness: L-3-L-5 Lower Extremities: Full ROM and Muscle Strength 5/5 Right Lower Extremity Flexion Produces Pain into [place]ella Arises from Table with ease Narrow Based gait  Neurological: She is alert and oriented to person, place, and time.  Skin: Skin is warm and dry.  Psychiatric: She has a normal mood and affect.  Nursing note and vitals reviewed.         Assessment & Plan:  1. Complex multifactorial pain left side neck and Left shoulder.3 main etiologies: A. myofascial pain left trapezius, left infraspinatus. Scheduled for Trigger Point Injection with Dr. Letta Pate B. Cervical spinal stenosis with left C4-C5 radiculopathy C. Cervical spondylosis with facet arthropathy Refilled: Hydrocodone 7.5/325 mg one tablet three times a day as needed for moderate pain #90.  We will continue the opioid monitoring program, this consists of regular clinic visits, examinations, urine drug screen, pill counts as well as use of [place] Controlled Substance Reporting System. 2. Cervical Radiculopathy: Continue Gabapentin 3. Chronic Low Back Pain: Schedule for MBB with Dr. Delice Lesch  4. Constipation: OIC: Movantik Samples given and She was educated on Movantik.  20 minutes of face to face patient care time was spent during this visit. All questions were encouraged and answered.

## 2016-06-06 ENCOUNTER — Encounter: Payer: Medicare Other | Admitting: Physical Therapy

## 2016-06-11 ENCOUNTER — Encounter: Payer: Self-pay | Admitting: Physical Medicine & Rehabilitation

## 2016-06-11 ENCOUNTER — Encounter: Payer: Self-pay | Admitting: Registered Nurse

## 2016-06-13 ENCOUNTER — Telehealth: Payer: Self-pay | Admitting: Registered Nurse

## 2016-06-13 NOTE — Telephone Encounter (Signed)
On January 18,2018 NCCSR was reviewed: No conflict was seen on the [place] with Multiple Prescribers. [name] a signed Narcotic Contract with our office. If there were any discrepancies this would have beenreported to her Physcian.

## 2016-06-14 ENCOUNTER — Telehealth: Payer: Self-pay | Admitting: *Deleted

## 2016-06-14 MED ORDER — NALOXEGOL OXALATE 25 MG PO TABS: 25.0000 mg | ORAL_TABLET | Freq: Every day | ORAL | 2 refills | Status: DC

## 2016-06-14 NOTE — Telephone Encounter (Signed)
[name] called for her mother for an Rx for the Movantik.  It is working for her but she is out of samples.  Rx sent to pharmacy

## 2016-06-17 ENCOUNTER — Other Ambulatory Visit: Payer: Self-pay | Admitting: Internal Medicine

## 2016-06-17 ENCOUNTER — Ambulatory Visit
Admission: RE | Admit: 2016-06-17 | Discharge: 2016-06-17 | Disposition: A | Discharge status: Home / Self Care | Payer: Medicare Other | Source: Ambulatory Visit | Attending: Internal Medicine | Admitting: Internal Medicine

## 2016-06-17 DIAGNOSIS — F324 Major depressive disorder, single episode, in partial remission: Secondary | ICD-10-CM | POA: Diagnosis not present

## 2016-06-17 DIAGNOSIS — R059 Cough, unspecified: Secondary | ICD-10-CM

## 2016-06-17 DIAGNOSIS — J449 Chronic obstructive pulmonary disease, unspecified: Secondary | ICD-10-CM | POA: Diagnosis not present

## 2016-06-17 DIAGNOSIS — N183 Chronic kidney disease, stage 3 (moderate): Secondary | ICD-10-CM | POA: Diagnosis not present

## 2016-06-17 DIAGNOSIS — R05 Cough: Secondary | ICD-10-CM

## 2016-06-17 DIAGNOSIS — I10 Essential (primary) hypertension: Secondary | ICD-10-CM | POA: Diagnosis not present

## 2016-06-17 DIAGNOSIS — E538 Deficiency of other specified B group vitamins: Secondary | ICD-10-CM | POA: Diagnosis not present

## 2016-06-17 DIAGNOSIS — G8929 Other chronic pain: Secondary | ICD-10-CM | POA: Diagnosis not present

## 2016-06-17 DIAGNOSIS — K219 Gastro-esophageal reflux disease without esophagitis: Secondary | ICD-10-CM | POA: Diagnosis not present

## 2016-06-17 DIAGNOSIS — Z23 Encounter for immunization: Secondary | ICD-10-CM | POA: Diagnosis not present

## 2016-06-17 DIAGNOSIS — R300 Dysuria: Secondary | ICD-10-CM | POA: Diagnosis not present

## 2016-06-17 DIAGNOSIS — E7800 Pure hypercholesterolemia, unspecified: Secondary | ICD-10-CM | POA: Diagnosis not present

## 2016-06-18 ENCOUNTER — Encounter: Payer: Self-pay | Admitting: Registered Nurse

## 2016-06-19 ENCOUNTER — Telehealth: Payer: Self-pay

## 2016-06-19 ENCOUNTER — Ambulatory Visit: Payer: Medicare Other | Admitting: Diagnostic Neuroimaging

## 2016-06-19 NOTE — Telephone Encounter (Signed)
Patient has been notified that [facility] will be available tomorrow 06/20/2016. Per Pharmacist at [facility].

## 2016-06-19 NOTE — Telephone Encounter (Signed)
I spoke with the Pharmacist at [facility] and she states that they had a limited quantity on medication Movantik. "Only for a short time period of time". However, they did place an order for medication today 06/19/2016 and meds will be in tomorrow 06/20/2016 for Movantik.

## 2016-06-20 ENCOUNTER — Encounter: Payer: Self-pay | Admitting: Diagnostic Neuroimaging

## 2016-06-26 IMAGING — CR DG LUMBAR SPINE COMPLETE 4+V
5 series · 5 of 5 positions shown · non-contrast
Comparison: Chest radiograph [DATE]

CLINICAL DATA: Fall 2 days ago following syncopal episode. Neck
pain and lower back pain. Pain radiating down left leg.

EXAM:
LUMBAR SPINE - COMPLETE 4+ VIEW

[t lumbar spine ap]
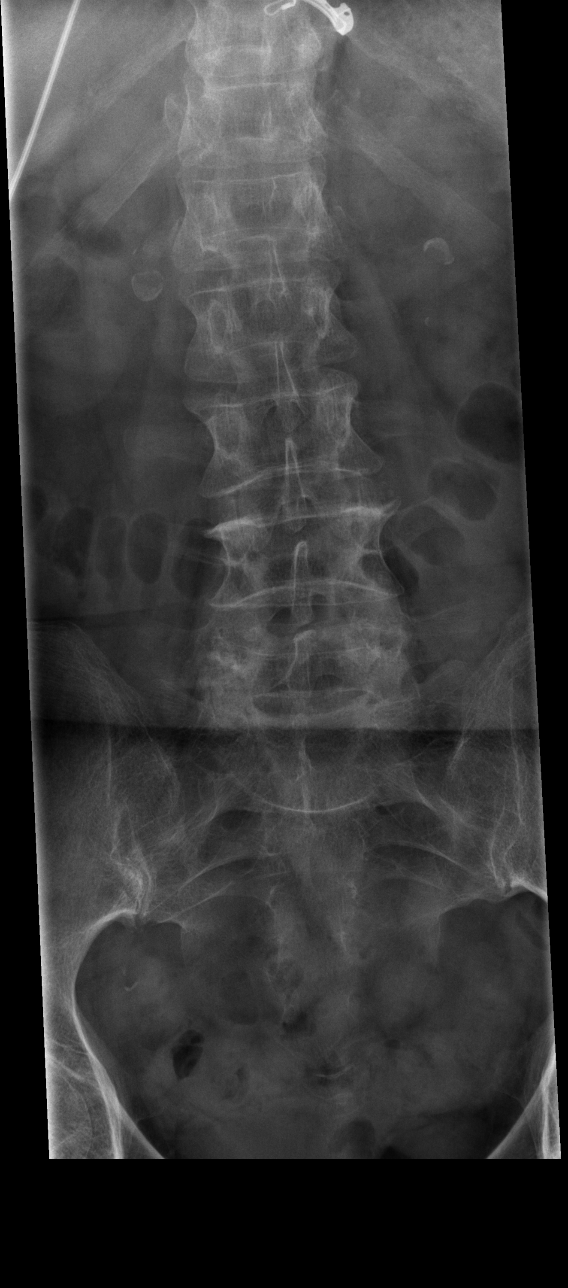

[t lumbar spine obl (1 of 2)]
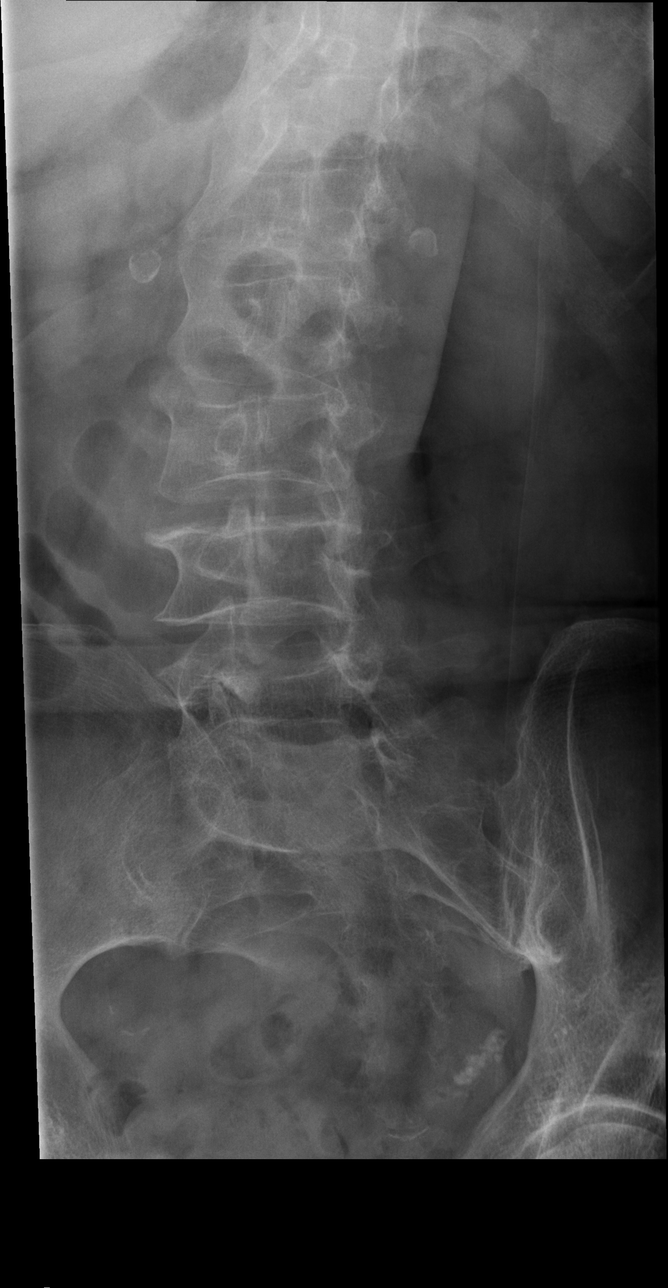

[t lumbar spine obl (2 of 2)]
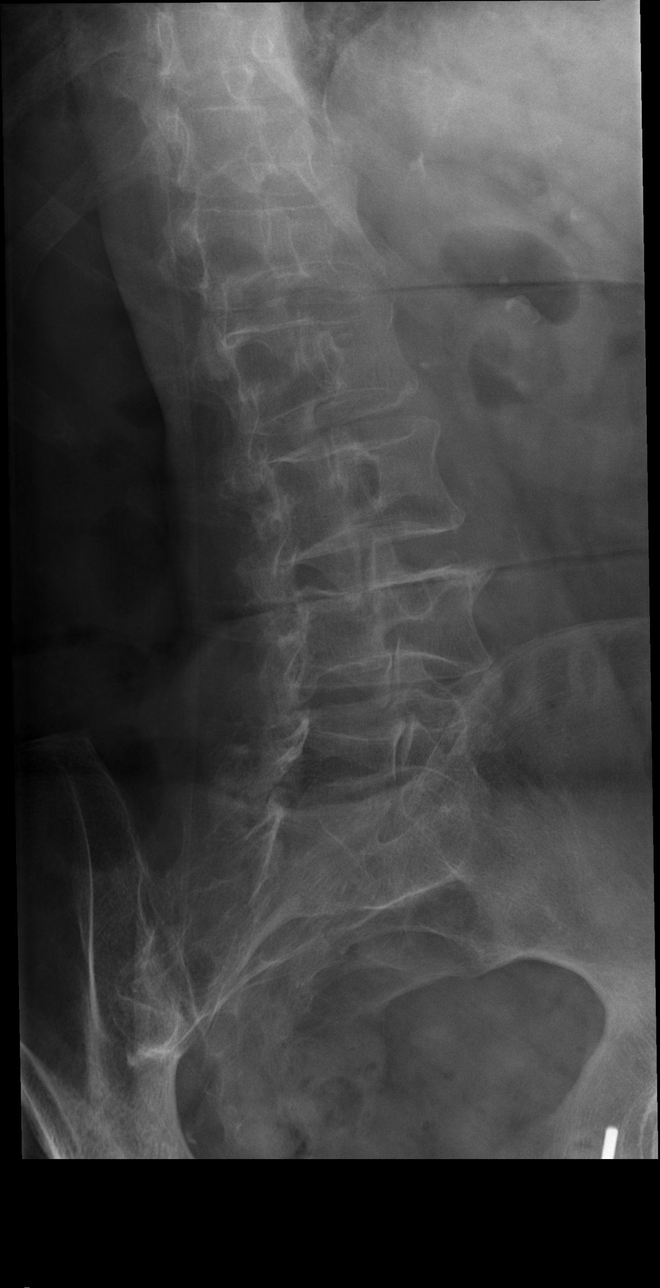

[t lumbar spine lat]
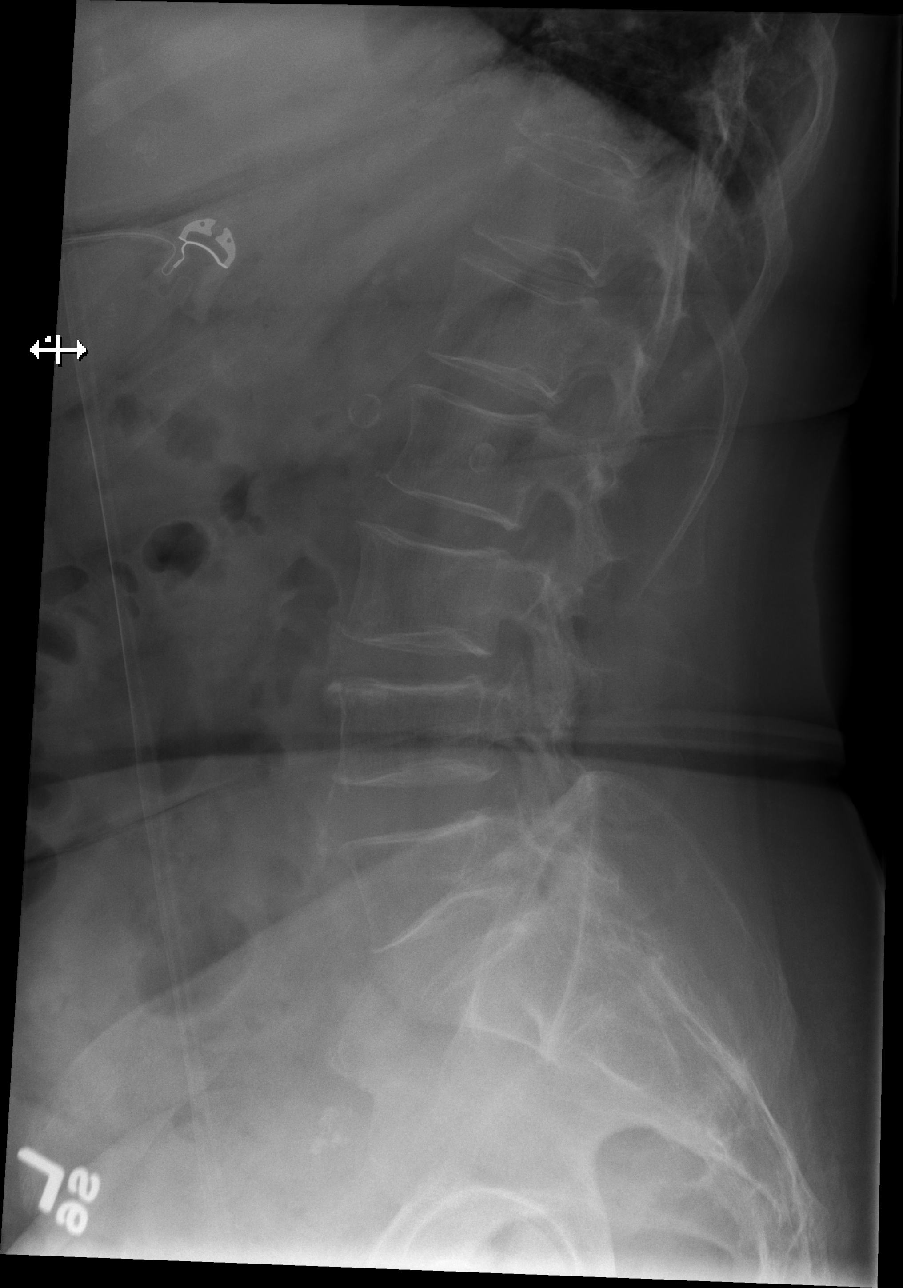

[t lumbar l-5 s-1 spot]
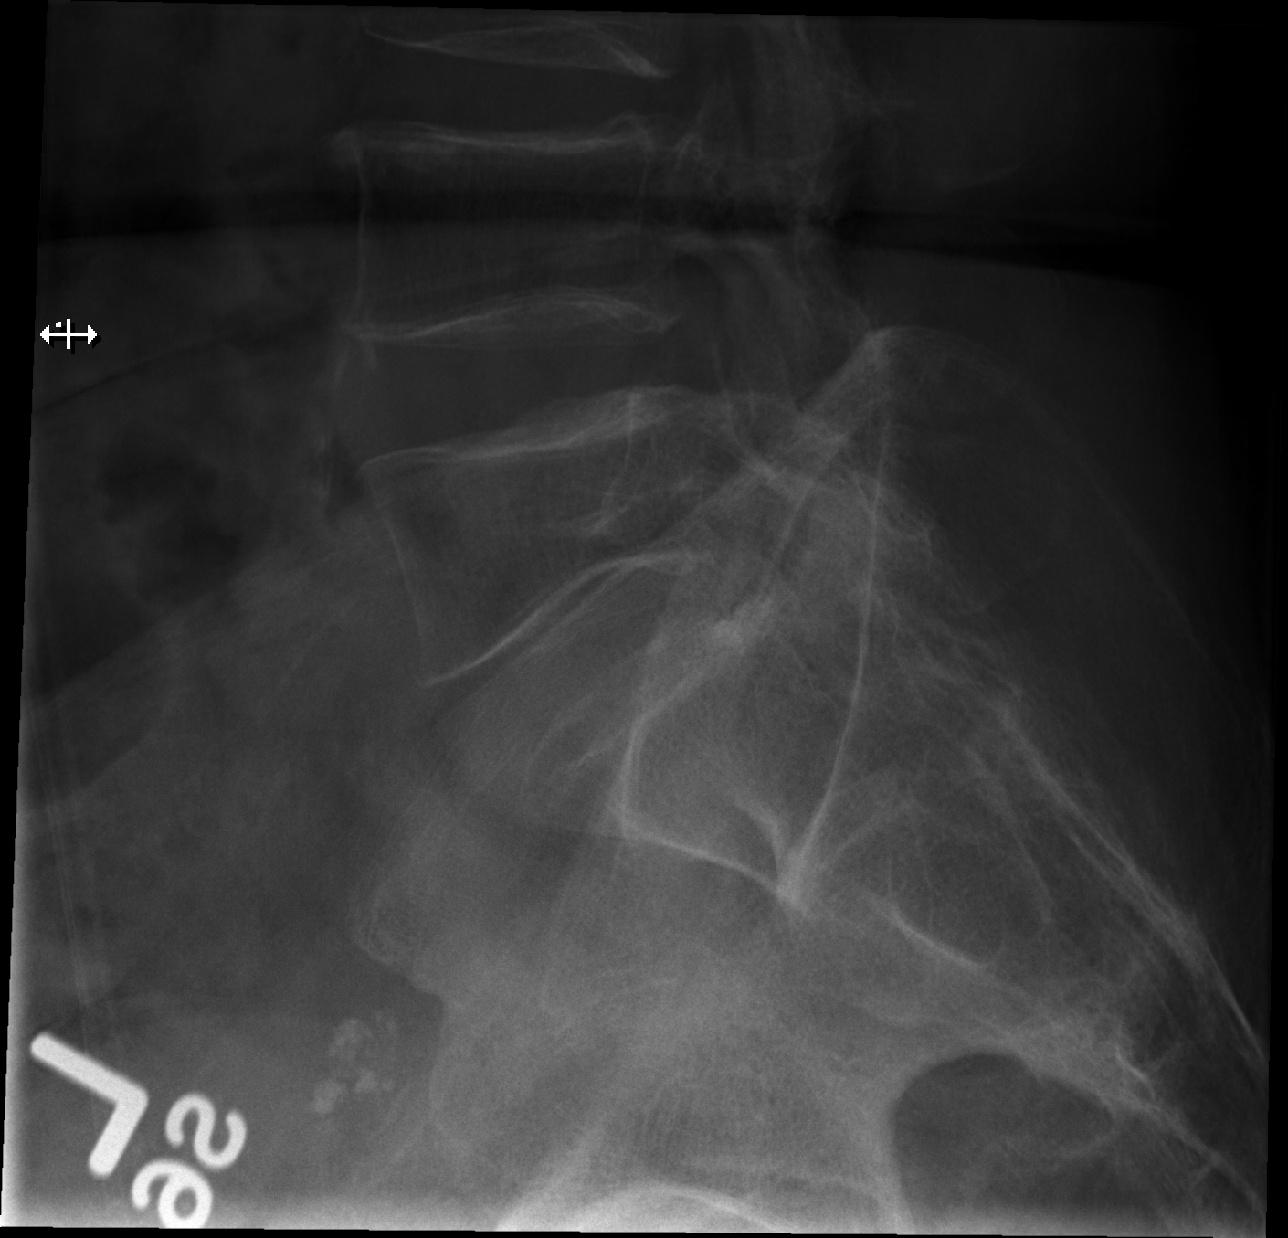

[5 of 5 positions shown; findings below may reference images not displayed]

FINDINGS: There is mild endplate spurring. There is no evidence acute loss of
vertebral body height or disc height. No subluxation.
IMPRESSION: No evidence acute injury to the lumbar spine.

## 2016-06-26 IMAGING — CT CT HEAD W/O CM
2 of 6 series · 13 of 47 positions shown, 16 images · non-contrast
Comparison: None.

CLINICAL DATA: Fall and loss consciousness. Fall 2 days prior after
syncopal episode.

EXAM:
CT HEAD WITHOUT CONTRAST
CT CERVICAL SPINE WITHOUT CONTRAST
TECHNIQUE: Multidetector CT imaging of the head and cervical spine was
performed following the standard protocol without intravenous
contrast. Multiplanar CT image reconstructions of the cervical spine
were also generated.

[Series 8: axial recon · axial · 0.23mm/px · z∈[+1058,+1213]mm · 10 of 109 slices shown, 13 images]
[im 10/109  brain]
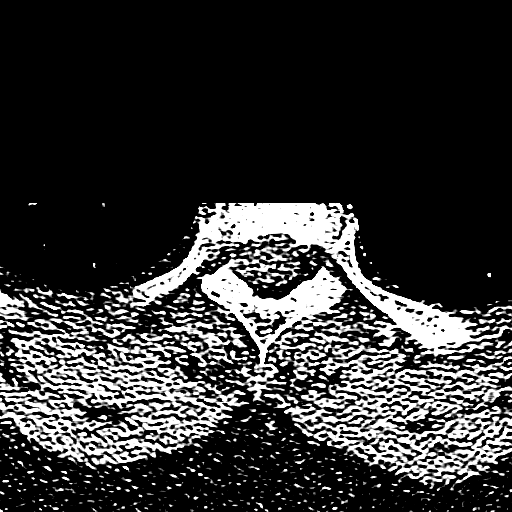
[im 10/109  bone]
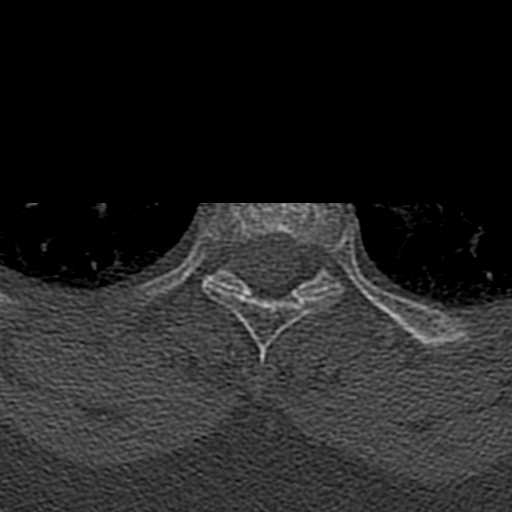
[im 20/109  brain]
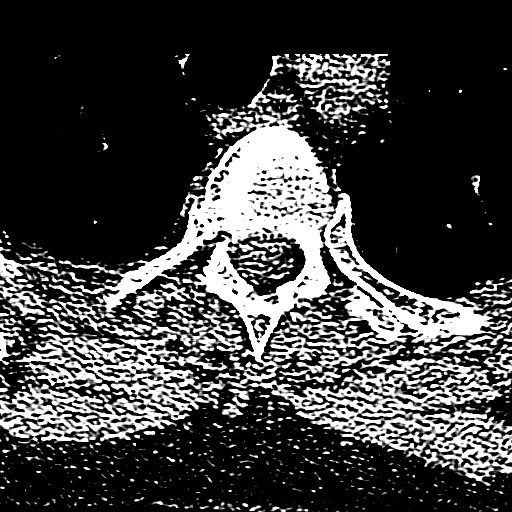
[im 30/109  brain]
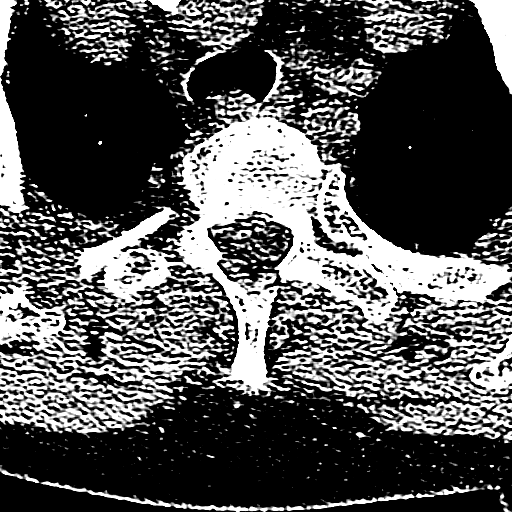
[im 40/109  brain]
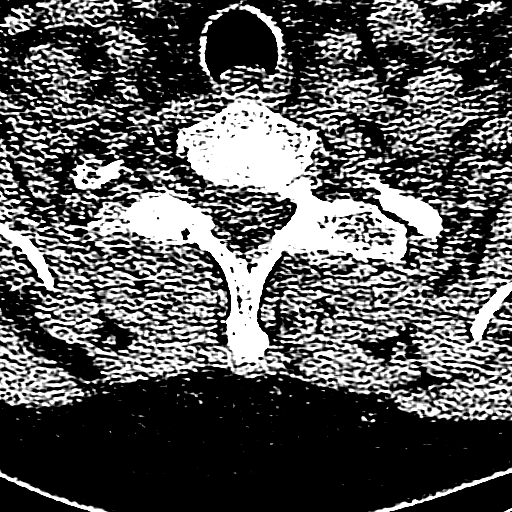
[im 50/109  brain]
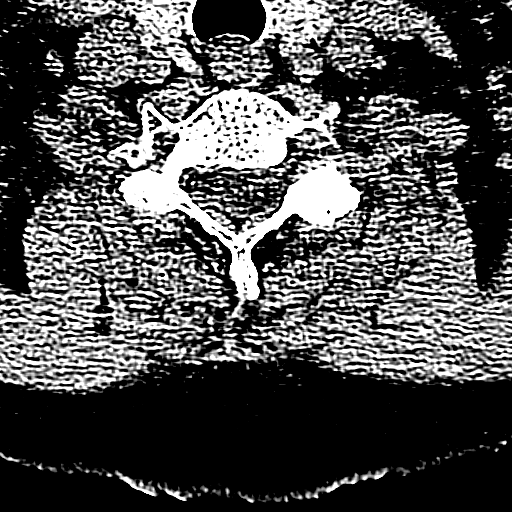
[im 50/109  bone]
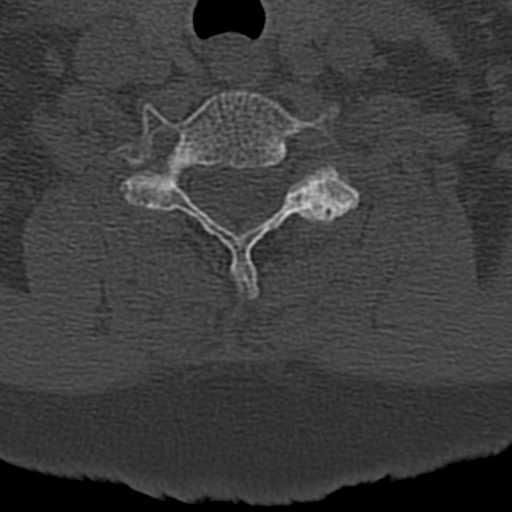
[im 59/109  brain]
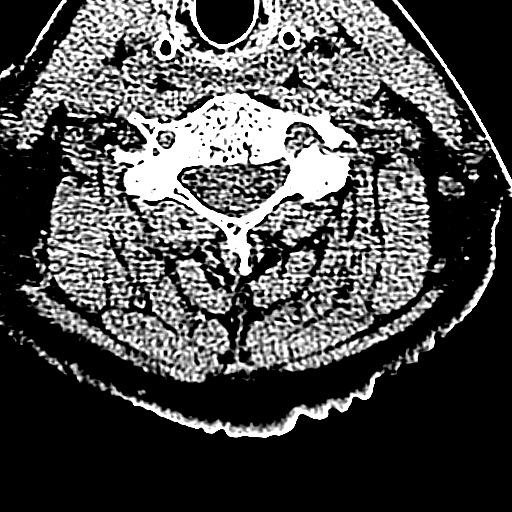
[im 69/109  brain]
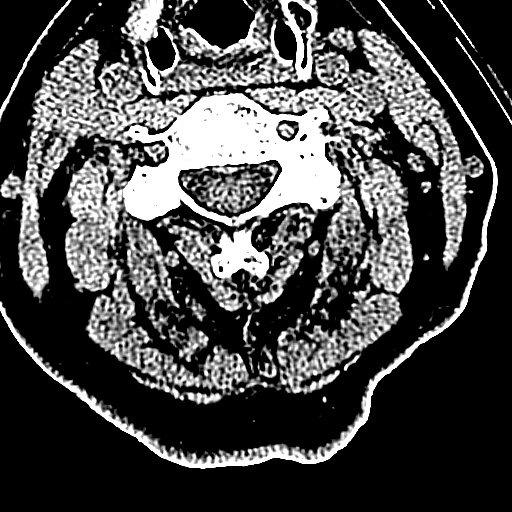
[im 79/109  brain]
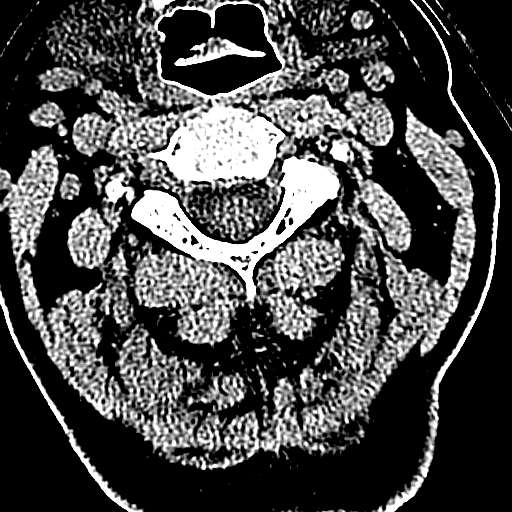
[im 89/109  brain]
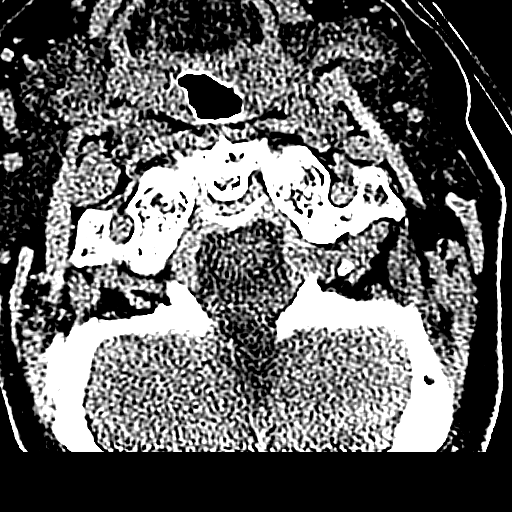
[im 89/109  bone]
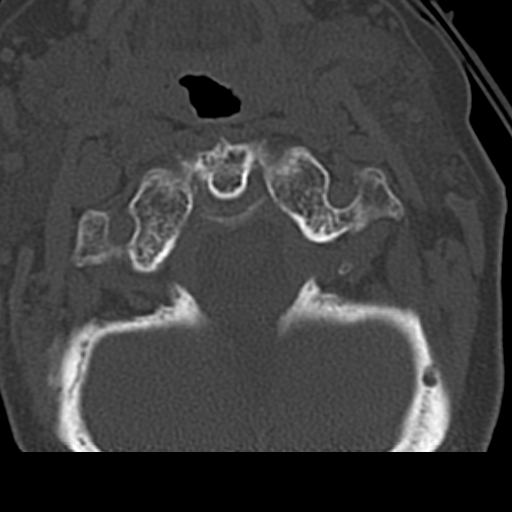
[im 99/109  brain]
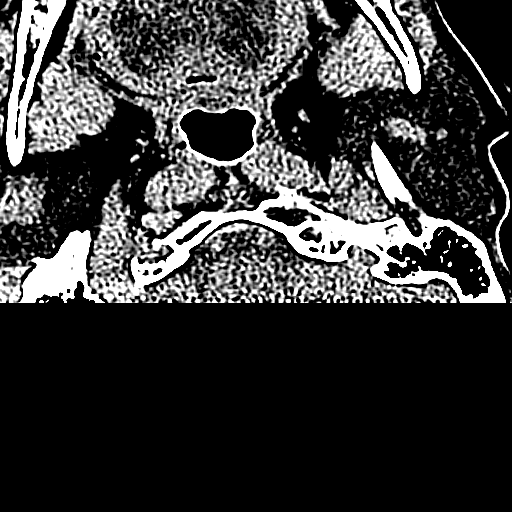

[Series 9: coronal · coronal · 0.24mm/px · 3 of 61 slices shown]
[im 21/61  brain]
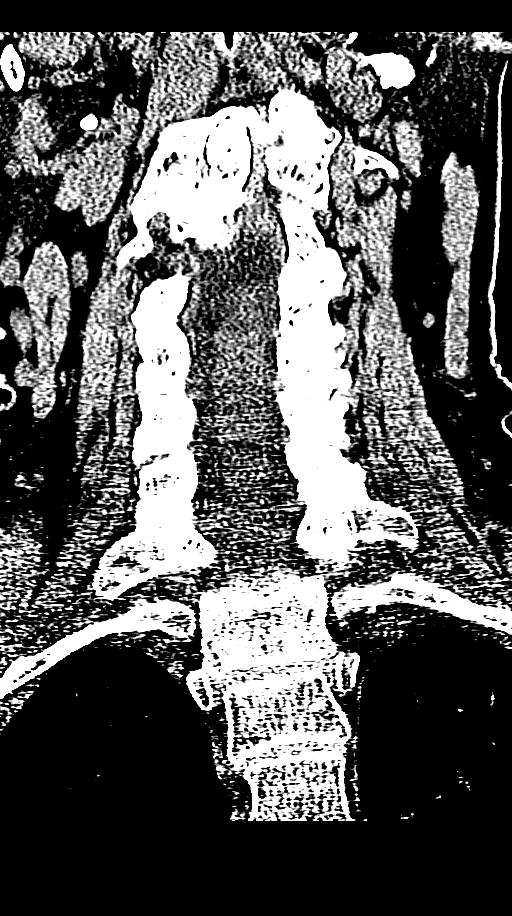
[im 27/61  brain]
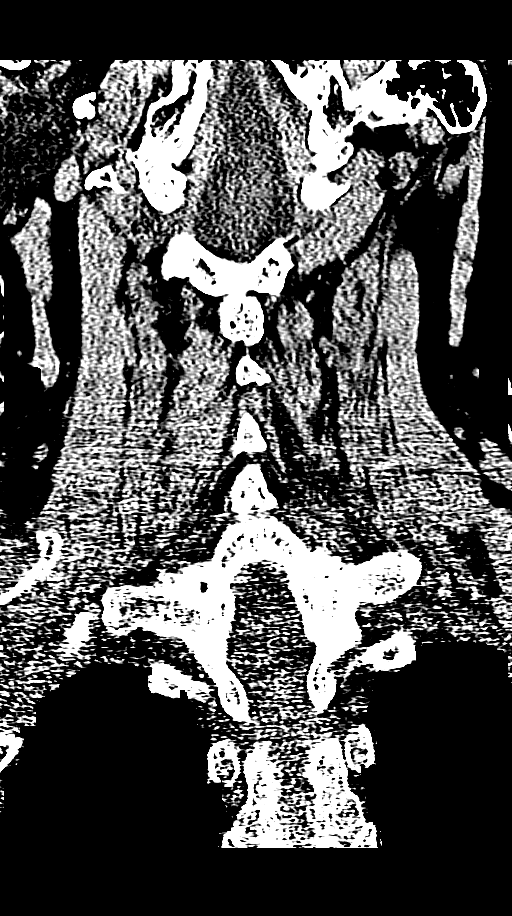
[im 34/61  brain]
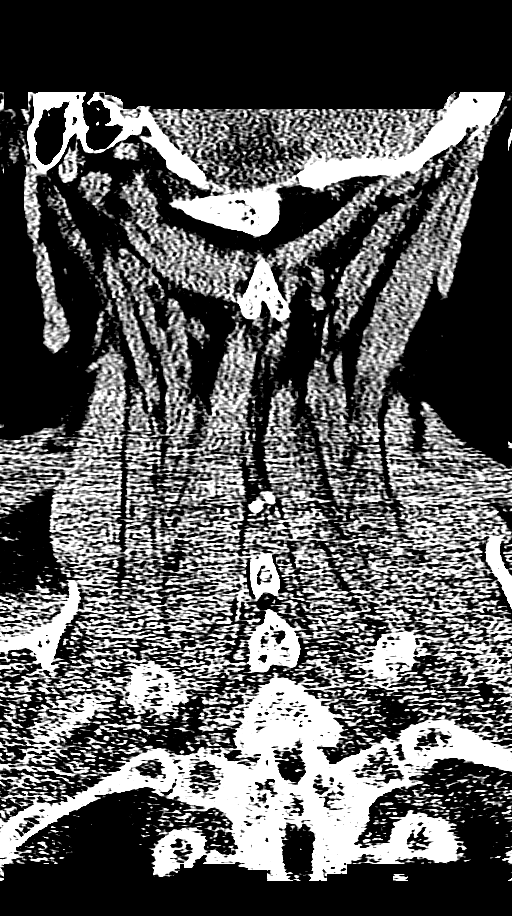

[13 of 47 positions shown; findings below may reference images not displayed]

FINDINGS: CT HEAD FINDINGS

No intracranial hemorrhage. No parenchymal contusion. No midline
shift or mass effect. Basilar cisterns are patent. No skull base
fracture. No fluid in the paranasal sinuses or mastoid air cells.
Orbits are normal. There is generalized cortical atrophy. There is
moderate periventricular subcortical white matter hypodensities

CT CERVICAL SPINE FINDINGS

No prevertebral soft tissue swelling. Normal alignment of cervical
vertebral bodies. No loss of vertebral body height. Normal facet
articulation. Normal craniocervical junction.

No evidence epidural or paraspinal hematoma.

There is mild degenerate endplate change. There is sclerosis at the
tip of the dens and calcification within the ligaments posterior the
dens.
IMPRESSION: 1. No intracranial trauma.
2. Mild atrophy and microvascular white matter disease.
3. No cervical spine fracture.
4. Degenerate change in the cervical spine most prominent at the
odontoid process.

## 2016-07-01 ENCOUNTER — Ambulatory Visit: Payer: Medicare Other | Admitting: Physical Medicine & Rehabilitation

## 2016-07-02 IMAGING — CR DG RIBS W/ CHEST 3+V*R*
4 series · 4 of 4 positions shown · non-contrast
Comparison: Chest x-ray of 07/25/2014

CLINICAL DATA: Right-sided upper back pain, fell 4 days ago, pain
in the posterior right lower ribs

EXAM:
RIGHT RIBS AND CHEST - 3+ VIEW

[w chest pa]
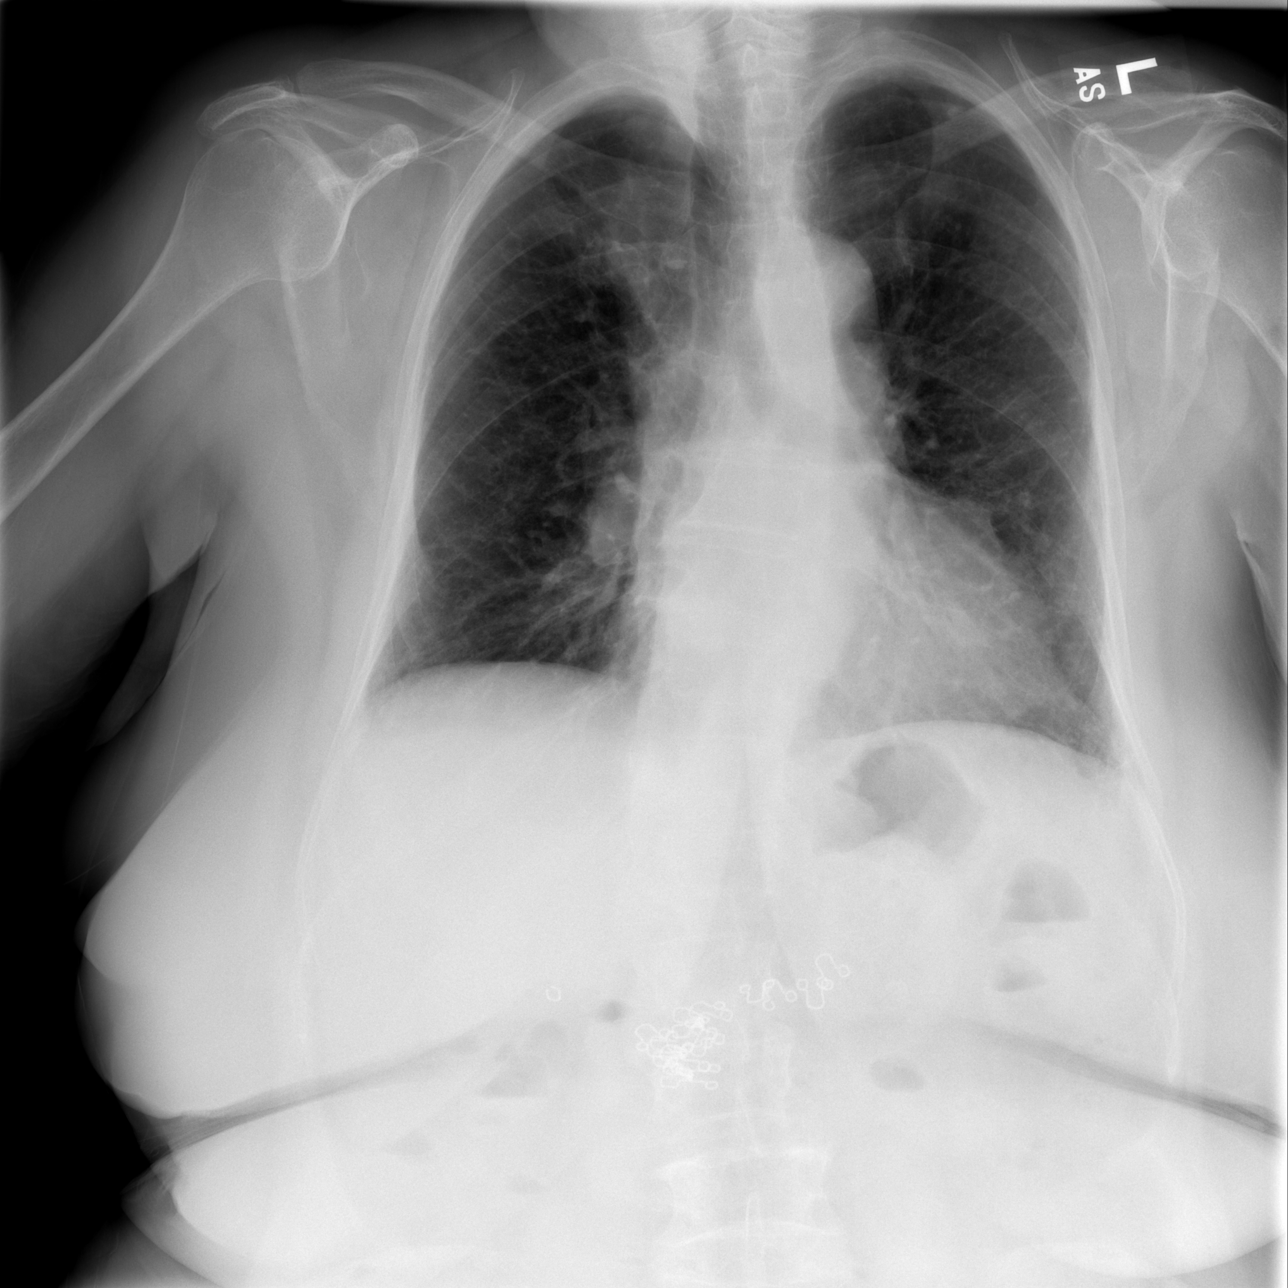

[w ribs ap/pa upper right *]
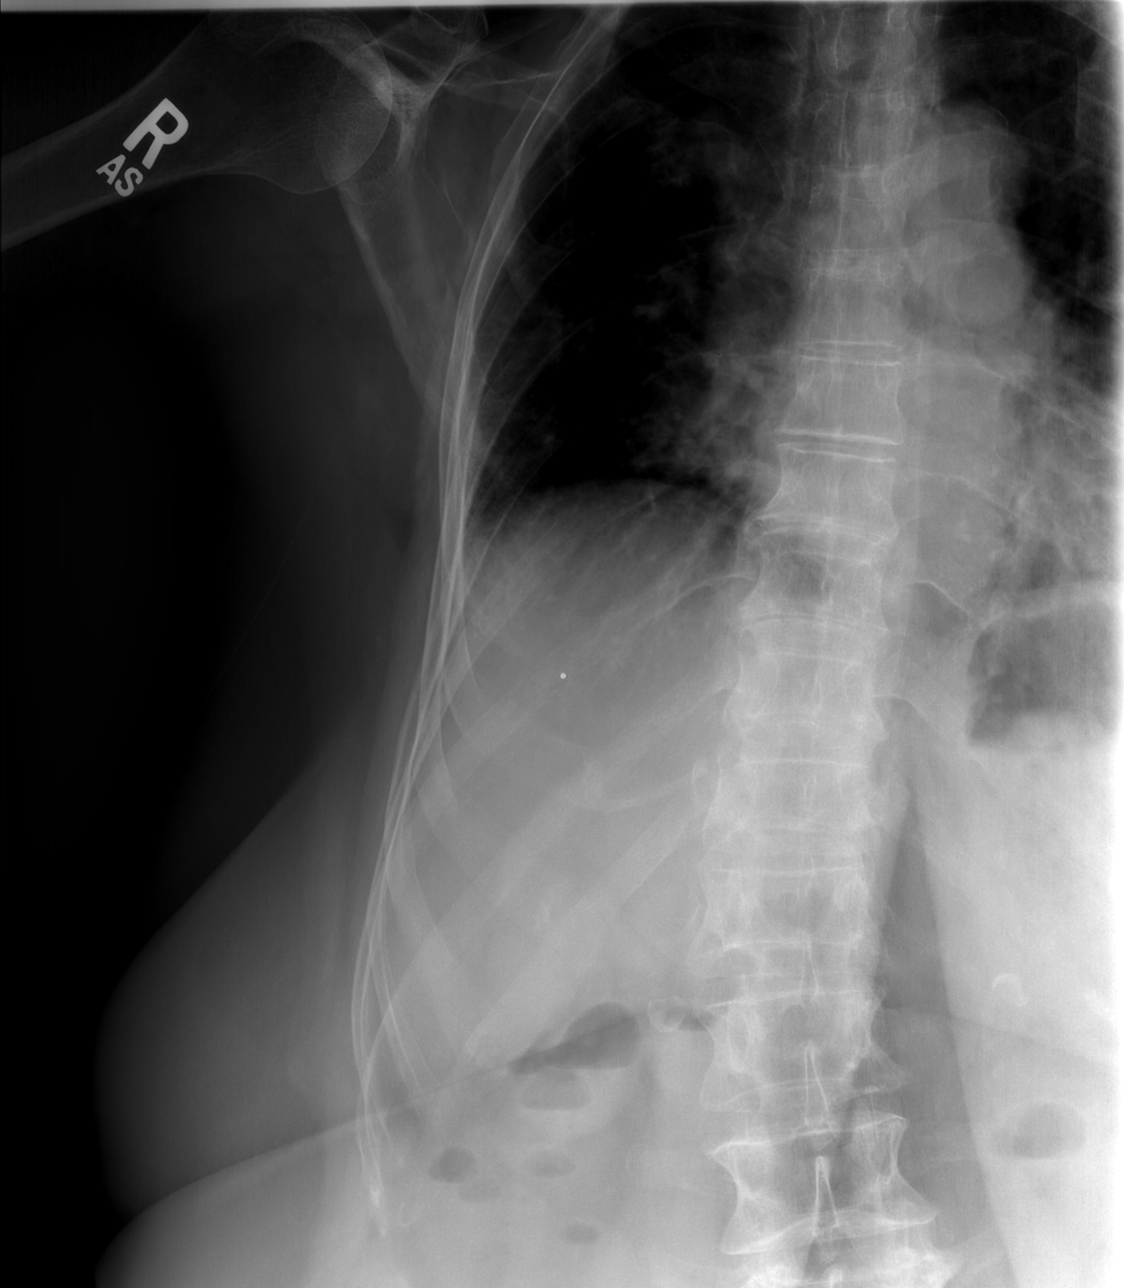

[w ribs ap/pa lower right *]
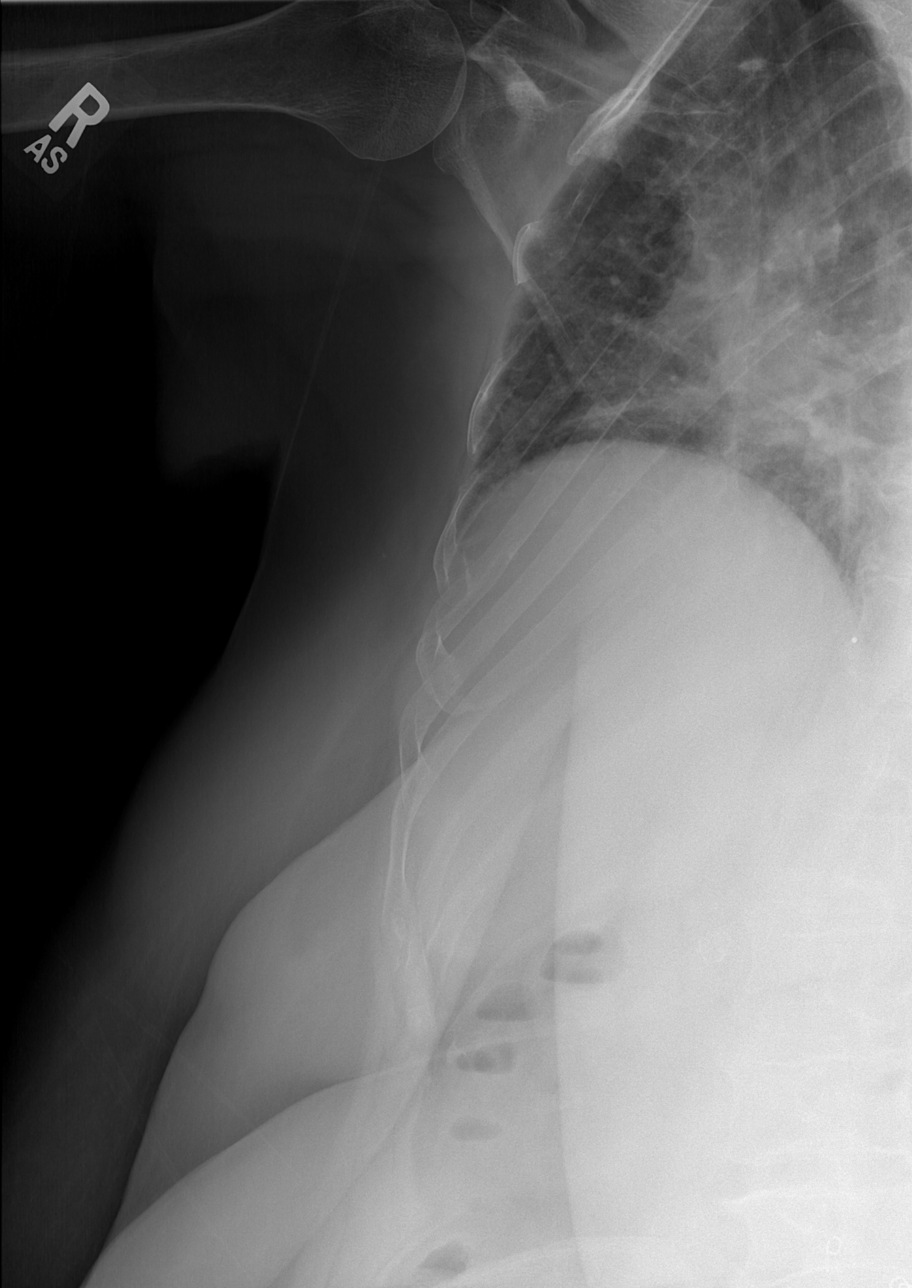

[w ribs oblique right *]
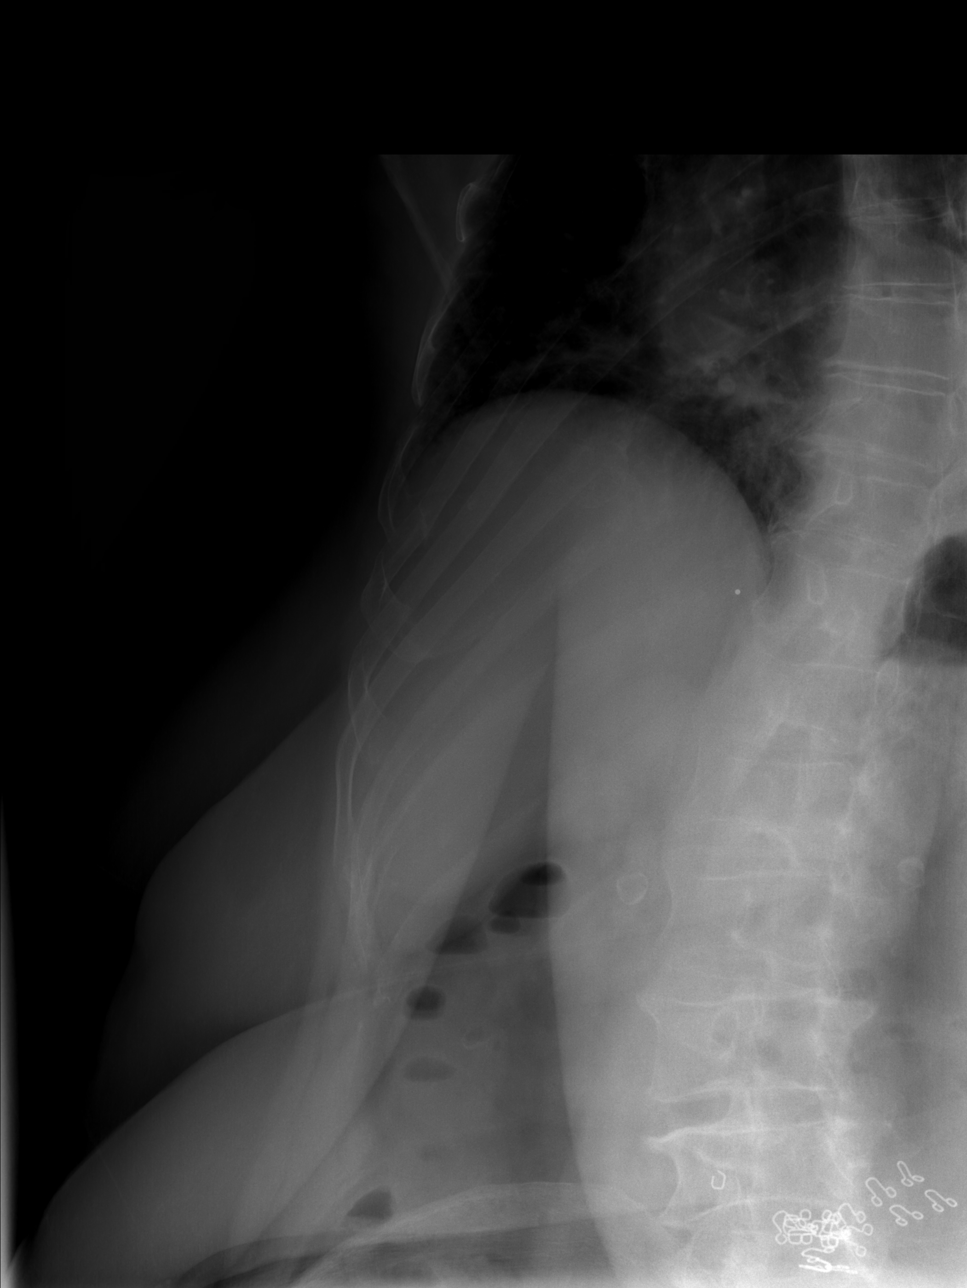

[4 of 4 positions shown; findings below may reference images not displayed]

FINDINGS: No active infiltrate or effusion is seen. A probable small granuloma
in the left lung apex is stable. Mediastinal and hilar contours are
unremarkable. Mild cardiomegaly is stable. There is a mild
thoracolumbar curvature present and the bones are osteopenic.

Right rib detail films show no acute right rib fracture. There is a
rounded calcification within the right upper quadrant which could
represent a gallstone.
IMPRESSION: 1. No active lung disease.
2. No definite right rib fracture is seen. The bones are osteopenic.
3. Question gallstone in the right upper quadrant.

## 2016-07-02 IMAGING — CR DG CERVICAL SPINE 2 OR 3 VIEWS
4 series · 4 of 4 positions shown · non-contrast
Comparison: Cervical spine CT scan September 22, 2014

CLINICAL DATA: Posterior neck pain, status post fall 4 days ago

EXAM:
CERVICAL SPINE - 2-3 VIEW

[w c-spine lat]
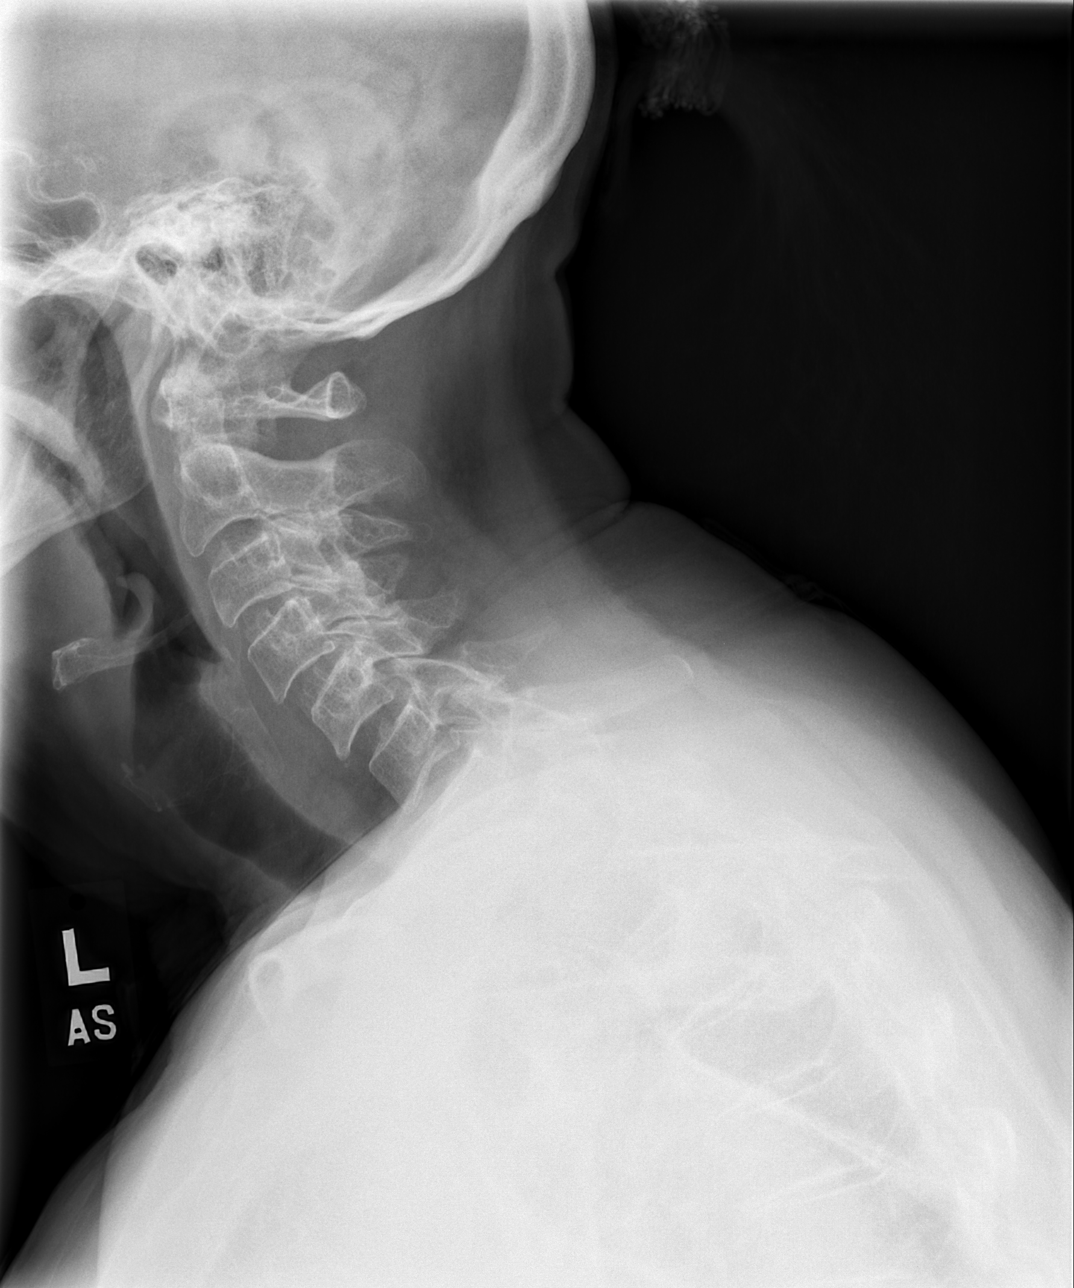

[w c-spine lat *]
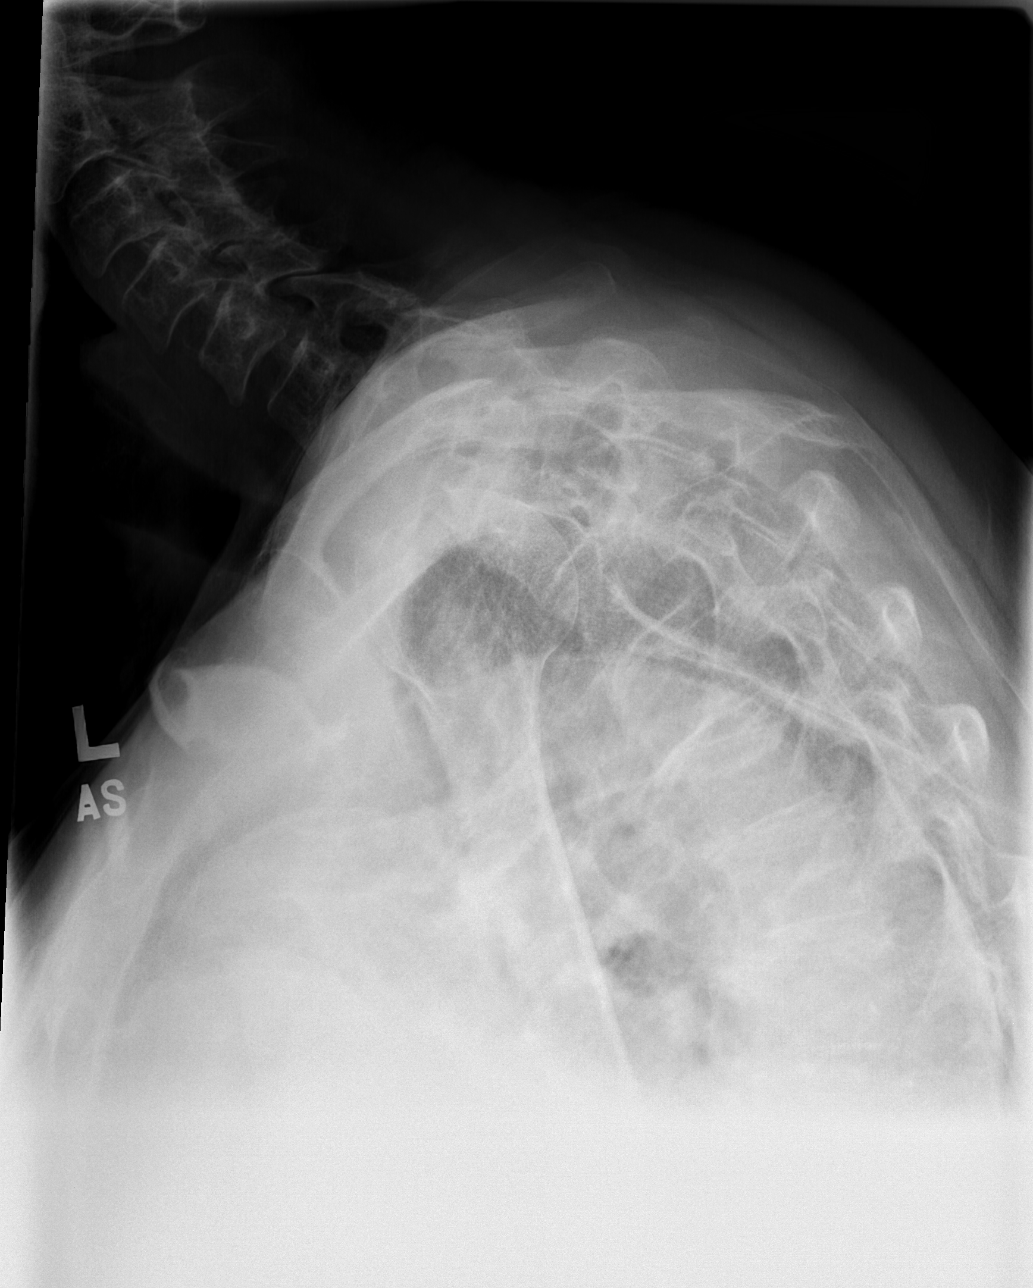

[w c-spine a.p. *]
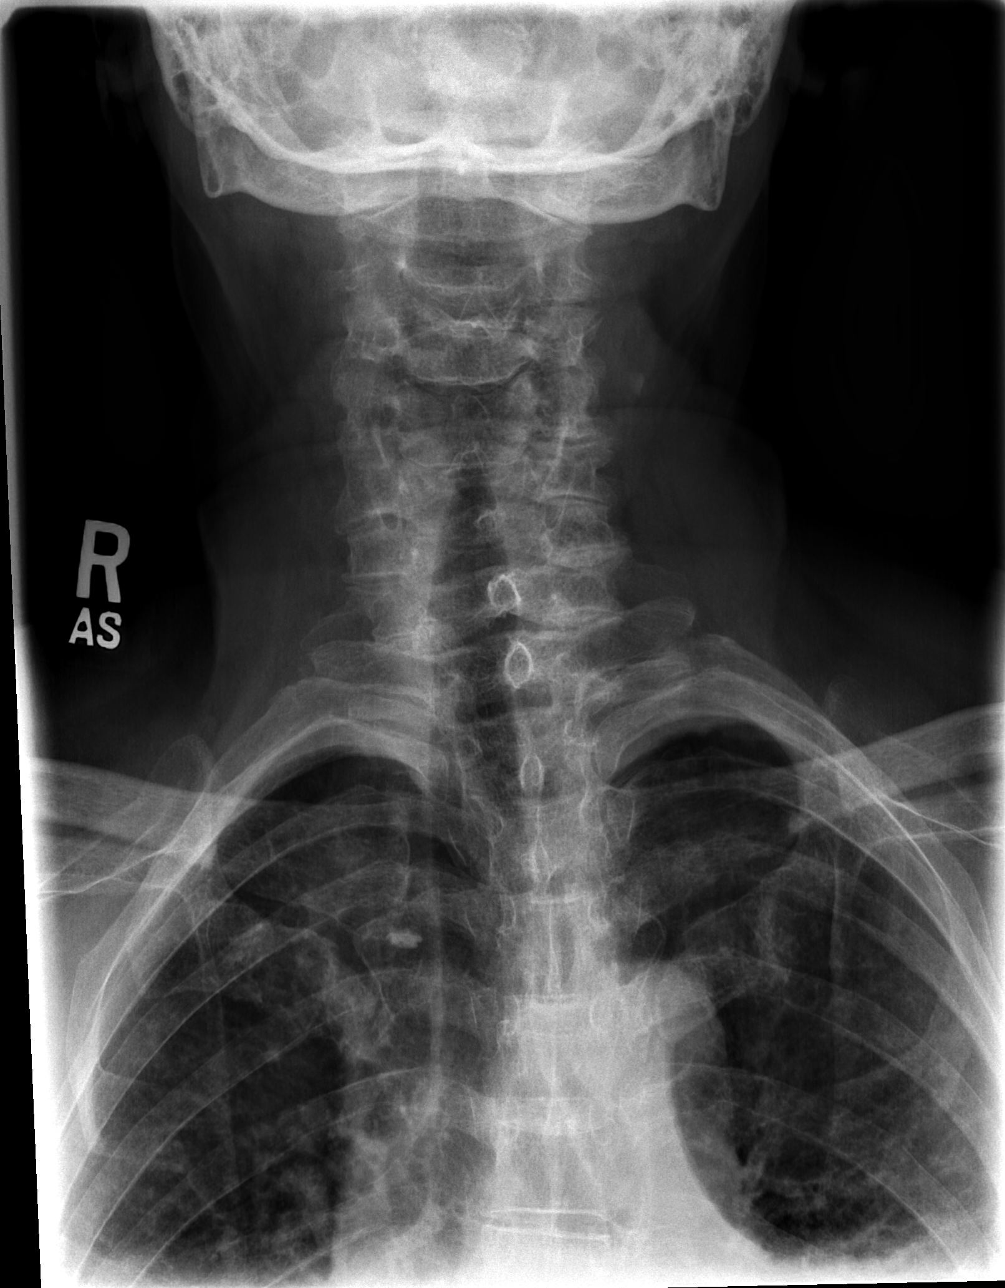

[w c-spine odontoid *]
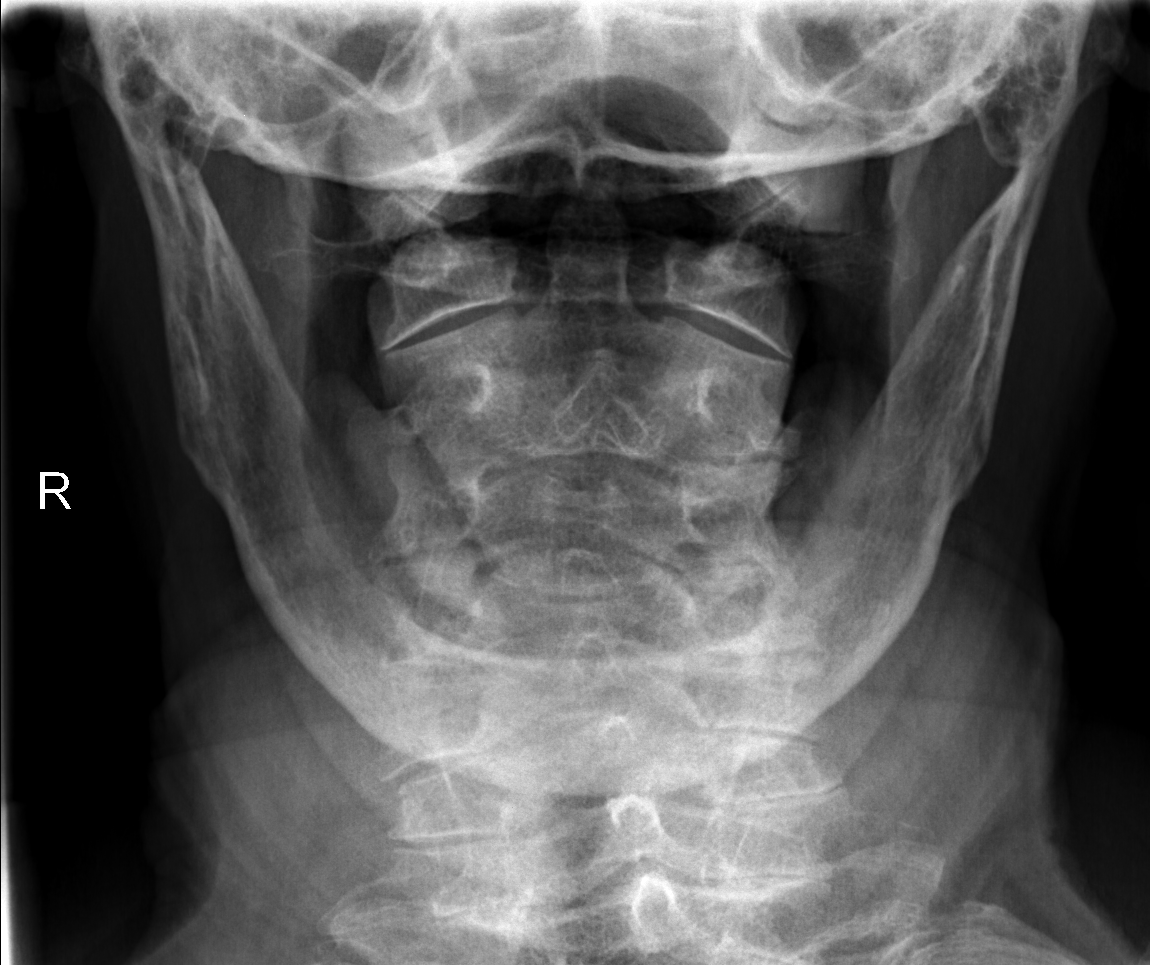

[4 of 4 positions shown; findings below may reference images not displayed]

FINDINGS: The cervical vertebral bodies are preserved in height. The disc
space heights are well maintained. There is mild multi level facet
joint hypertrophy. There is degenerative change of the atlanto-dens
articulation. The prevertebral soft tissue spaces are normal.
IMPRESSION: There is no acute bony abnormality of the cervical spine.

## 2016-07-09 ENCOUNTER — Encounter: Payer: Medicare Other | Attending: Physical Medicine & Rehabilitation | Admitting: Registered Nurse

## 2016-07-09 ENCOUNTER — Encounter: Payer: Self-pay | Admitting: Registered Nurse

## 2016-07-09 VITALS — BP 125/71 | HR 65

## 2016-07-09 DIAGNOSIS — S63502A Unspecified sprain of left wrist, initial encounter: Secondary | ICD-10-CM | POA: Insufficient documentation

## 2016-07-09 DIAGNOSIS — E785 Hyperlipidemia, unspecified: Secondary | ICD-10-CM | POA: Diagnosis not present

## 2016-07-09 DIAGNOSIS — H3530 Unspecified macular degeneration: Secondary | ICD-10-CM | POA: Insufficient documentation

## 2016-07-09 DIAGNOSIS — I639 Cerebral infarction, unspecified: Secondary | ICD-10-CM | POA: Diagnosis not present

## 2016-07-09 DIAGNOSIS — M25512 Pain in left shoulder: Secondary | ICD-10-CM

## 2016-07-09 DIAGNOSIS — J449 Chronic obstructive pulmonary disease, unspecified: Secondary | ICD-10-CM | POA: Diagnosis not present

## 2016-07-09 DIAGNOSIS — Z5181 Encounter for therapeutic drug level monitoring: Secondary | ICD-10-CM

## 2016-07-09 DIAGNOSIS — M1990 Unspecified osteoarthritis, unspecified site: Secondary | ICD-10-CM | POA: Insufficient documentation

## 2016-07-09 DIAGNOSIS — F329 Major depressive disorder, single episode, unspecified: Secondary | ICD-10-CM | POA: Insufficient documentation

## 2016-07-09 DIAGNOSIS — D649 Anemia, unspecified: Secondary | ICD-10-CM | POA: Diagnosis not present

## 2016-07-09 DIAGNOSIS — M5136 Other intervertebral disc degeneration, lumbar region: Secondary | ICD-10-CM | POA: Insufficient documentation

## 2016-07-09 DIAGNOSIS — G894 Chronic pain syndrome: Secondary | ICD-10-CM

## 2016-07-09 DIAGNOSIS — Z79899 Other long term (current) drug therapy: Secondary | ICD-10-CM | POA: Diagnosis not present

## 2016-07-09 DIAGNOSIS — M545 Low back pain, unspecified: Secondary | ICD-10-CM

## 2016-07-09 DIAGNOSIS — M5412 Radiculopathy, cervical region: Secondary | ICD-10-CM | POA: Diagnosis not present

## 2016-07-09 DIAGNOSIS — M810 Age-related osteoporosis without current pathological fracture: Secondary | ICD-10-CM | POA: Insufficient documentation

## 2016-07-09 DIAGNOSIS — G8929 Other chronic pain: Secondary | ICD-10-CM | POA: Diagnosis not present

## 2016-07-09 DIAGNOSIS — M47812 Spondylosis without myelopathy or radiculopathy, cervical region: Secondary | ICD-10-CM

## 2016-07-09 DIAGNOSIS — K219 Gastro-esophageal reflux disease without esophagitis: Secondary | ICD-10-CM | POA: Diagnosis not present

## 2016-07-09 DIAGNOSIS — S22022A Unstable burst fracture of second thoracic vertebra, initial encounter for closed fracture: Secondary | ICD-10-CM | POA: Insufficient documentation

## 2016-07-09 DIAGNOSIS — M797 Fibromyalgia: Secondary | ICD-10-CM | POA: Diagnosis not present

## 2016-07-09 DIAGNOSIS — I10 Essential (primary) hypertension: Secondary | ICD-10-CM | POA: Insufficient documentation

## 2016-07-09 DIAGNOSIS — S12600A Unspecified displaced fracture of seventh cervical vertebra, initial encounter for closed fracture: Secondary | ICD-10-CM | POA: Diagnosis not present

## 2016-07-09 DIAGNOSIS — Z79891 Long term (current) use of opiate analgesic: Secondary | ICD-10-CM | POA: Diagnosis not present

## 2016-07-09 DIAGNOSIS — F17210 Nicotine dependence, cigarettes, uncomplicated: Secondary | ICD-10-CM | POA: Diagnosis not present

## 2016-07-09 IMAGING — CT CT T SPINE W/O CM
2 of 3 series · 13 of 33 positions shown, 16 images · non-contrast
Comparison: CT cervical spine 10/05/2014

CLINICAL DATA: Fall.  C7 fracture

EXAM:
CT THORACIC SPINE WITHOUT CONTRAST
TECHNIQUE: Multidetector CT imaging of the thoracic spine was performed without
intravenous contrast administration. Multiplanar CT image
reconstructions were also generated.

[Series 4: t-spine 2.0 i30s 3 · axial · 0.30mm/px · z∈[-209,+99]mm · 10 of 183 slices shown, 13 images]
[im 15/183  soft-tissue]
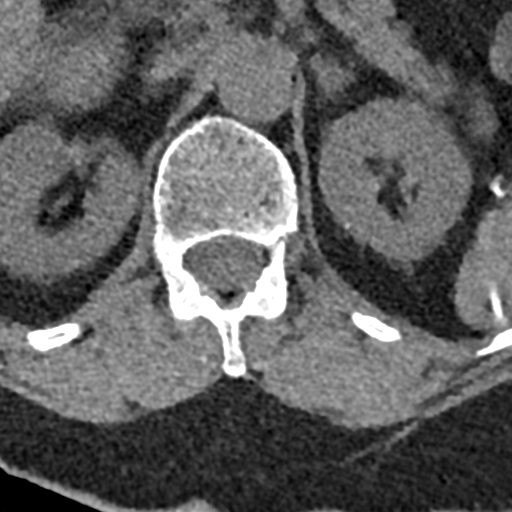
[im 15/183  bone]
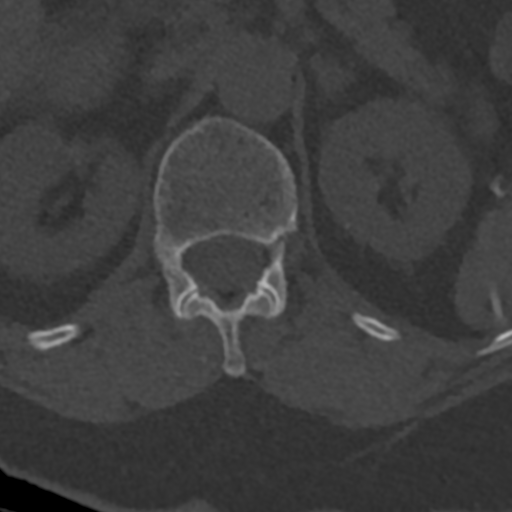
[im 29/183  bone]
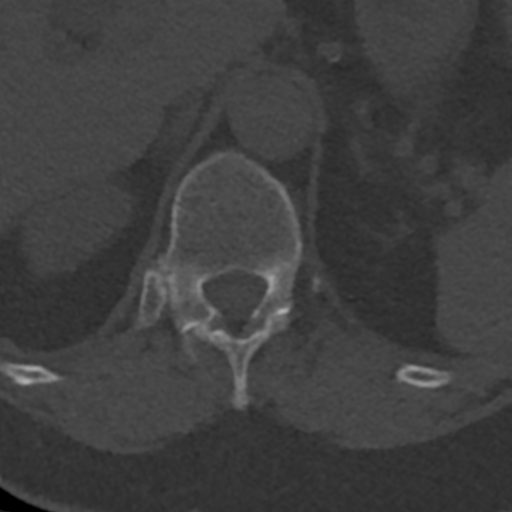
[im 57/183  bone]
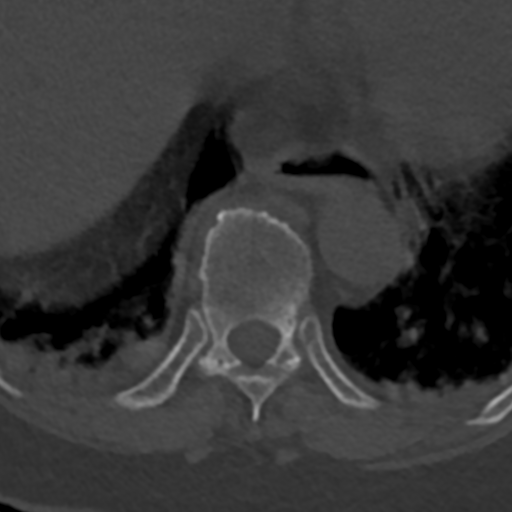
[im 71/183  bone]
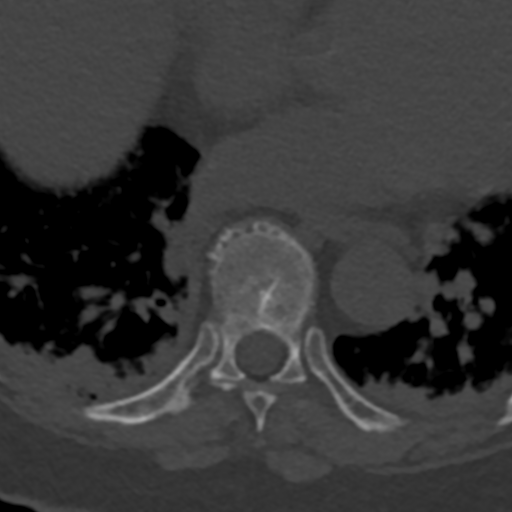
[im 85/183  soft-tissue]
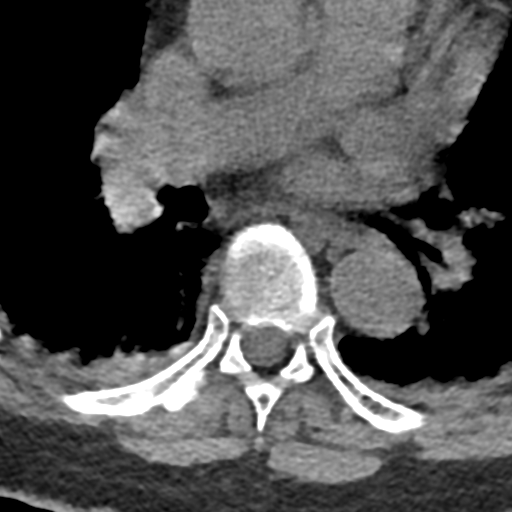
[im 85/183  bone]
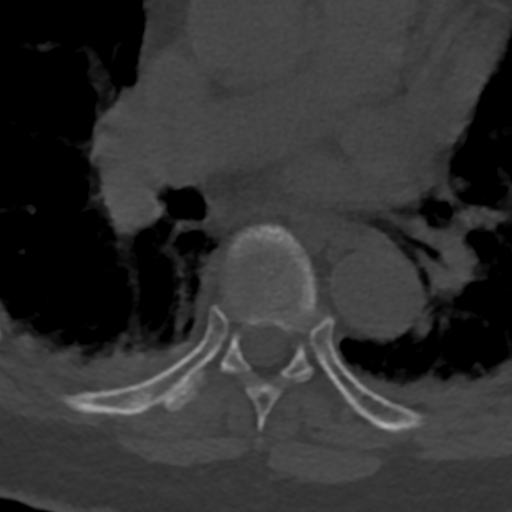
[im 99/183  bone]
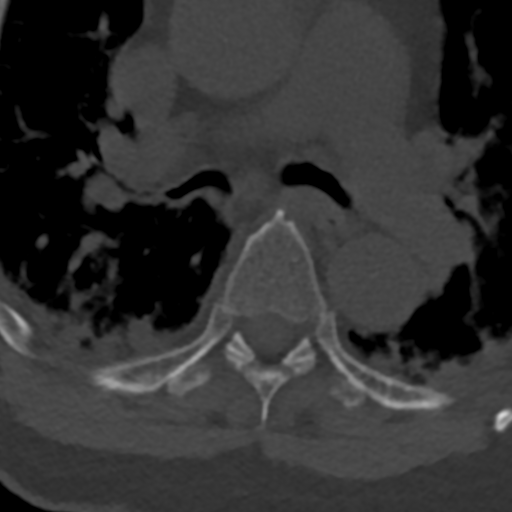
[im 113/183  bone]
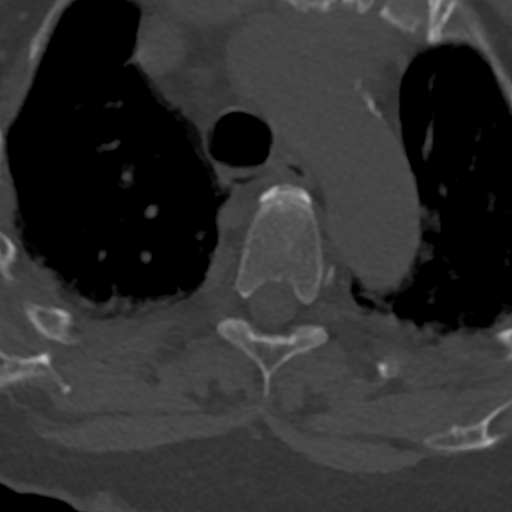
[im 141/183  bone]
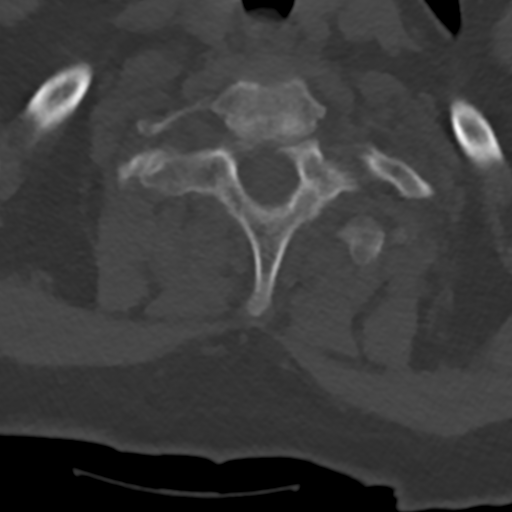
[im 155/183  soft-tissue]
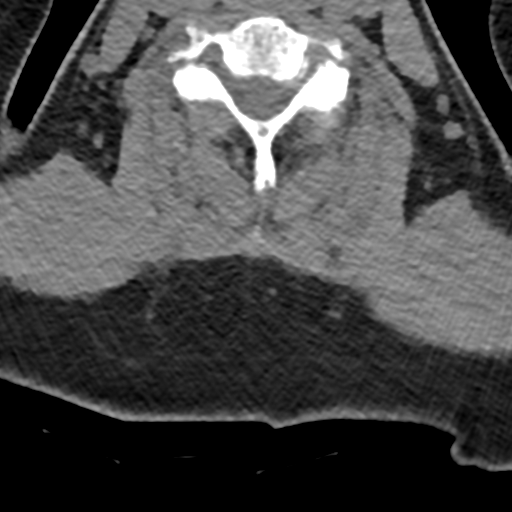
[im 155/183  bone]
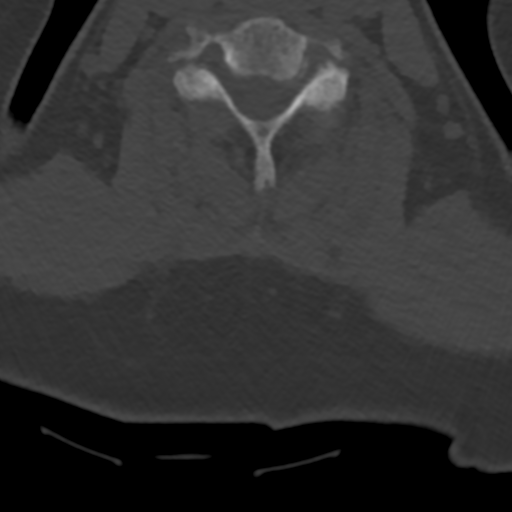
[im 169/183  bone]
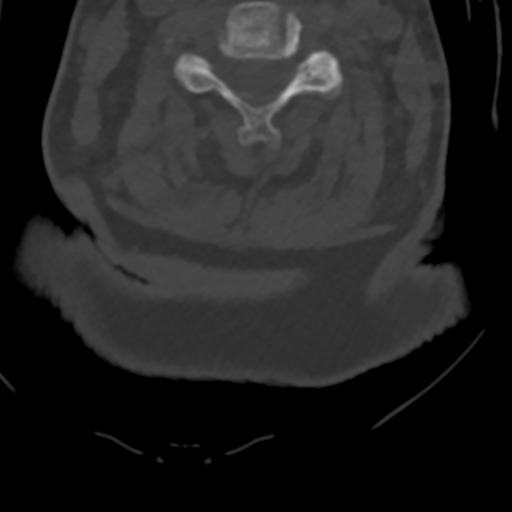

[Series 7: coronal st · coronal · 0.32mm/px · 3 of 71 slices shown]
[im 15/71  bone]
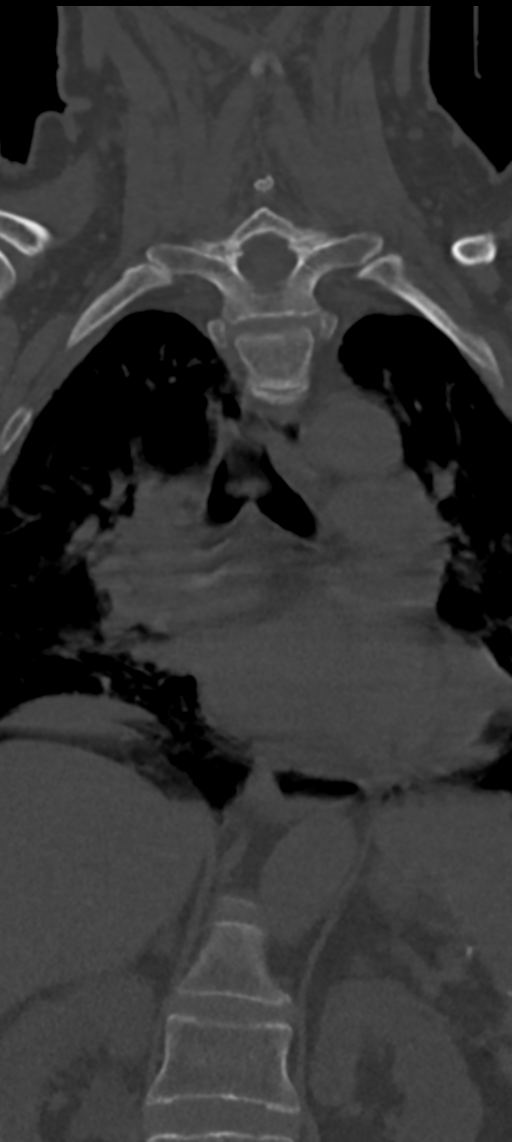
[im 29/71  bone]
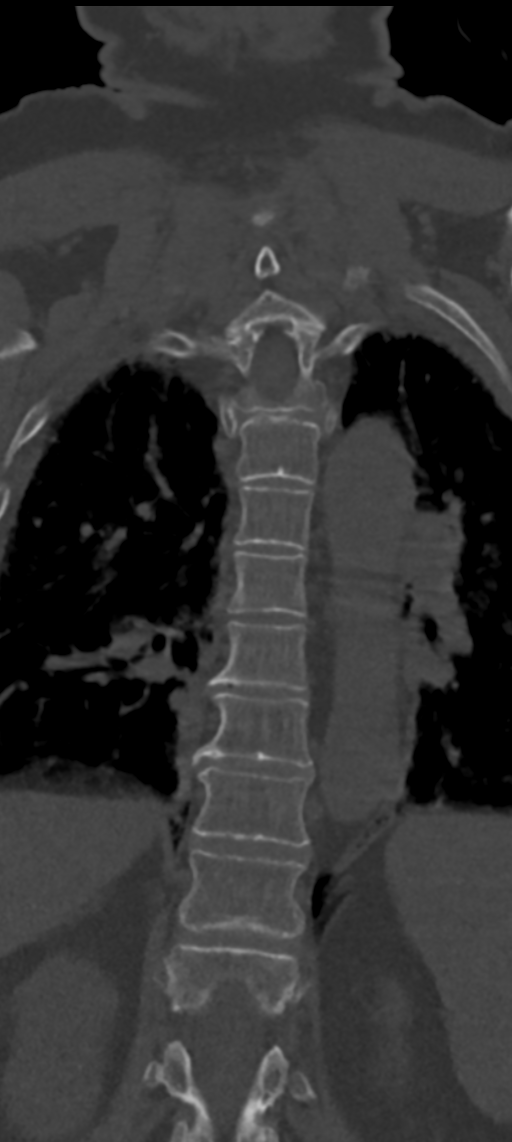
[im 43/71  bone]
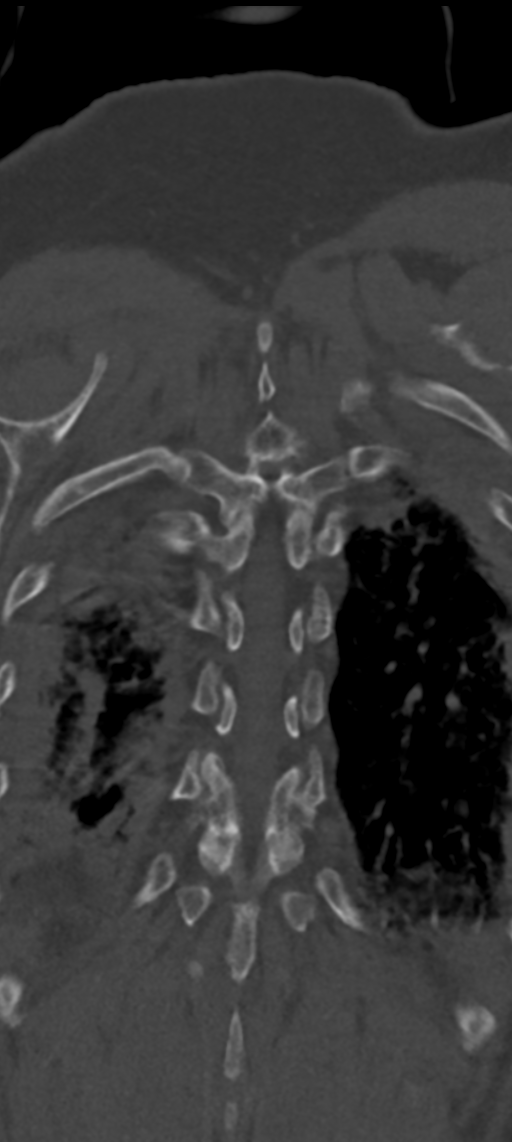

[13 of 33 positions shown; findings below may reference images not displayed]

FINDINGS: Compression fracture C7 again identified and appears acute. See
separate CT cervical spine report

Mild loss of height T2 and T3 vertebral bodies consistent with mild
fracture, possibly acute. No other fracture. No evidence of
metastatic disease.

Mild thoracic disc degeneration throughout the cervical thoracic
spine. No significant spinal stenosis

Bibasilar atelectasis.
IMPRESSION: Acute fracture C7.  See separate cervical spine CT report

Mild compression fractures T2 and T3 of indeterminate age but
possibly acute. MRI may be helpful to date these fractures.

Mild bibasilar atelectasis.

## 2016-07-09 IMAGING — CT CT CERVICAL SPINE W/O CM
5 of 7 series · 14 of 33 positions shown, 16 images · non-contrast
Comparison: CT head 06/26/2011

CLINICAL DATA: Fall.

EXAM:
CT HEAD WITHOUT CONTRAST
CT CERVICAL SPINE WITHOUT CONTRAST
TECHNIQUE: Multidetector CT imaging of the head and cervical spine was
performed following the standard protocol without intravenous
contrast. Multiplanar CT image reconstructions of the cervical spine
were also generated.

[Series 2: head 2.0 h70h · axial · 0.45mm/px · z∈[+1176,+1226]mm · 2 of 75 slices shown]
[im 25/75  bone]
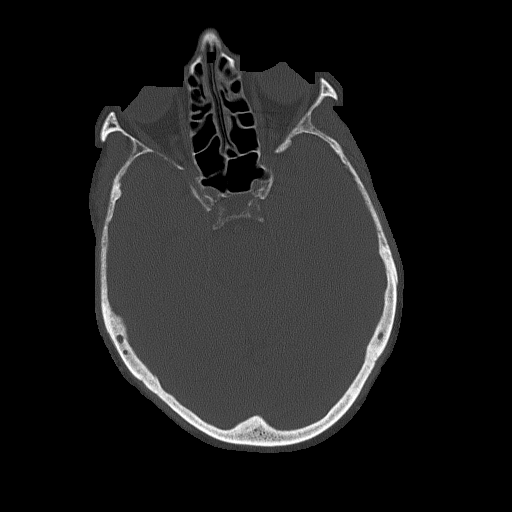
[im 50/75  bone]
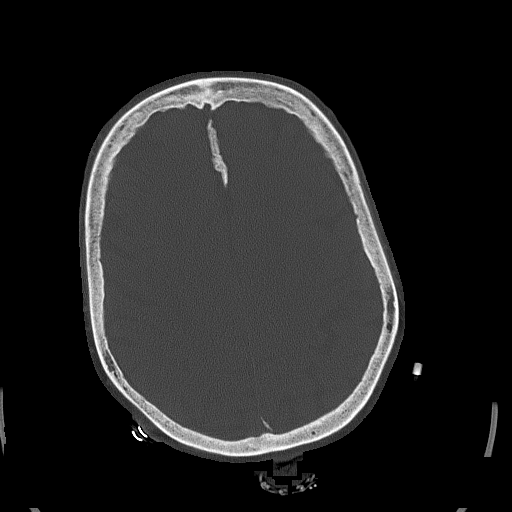

[Series 7: c_spine 2.0 i40s 3 · axial · 0.31mm/px · z∈[+1036,+1090]mm · 2 of 81 slices shown, 3 images]
[im 27/81  soft-tissue]
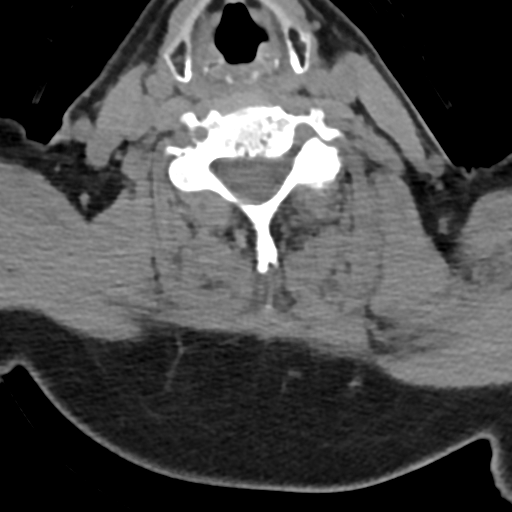
[im 27/81  bone]
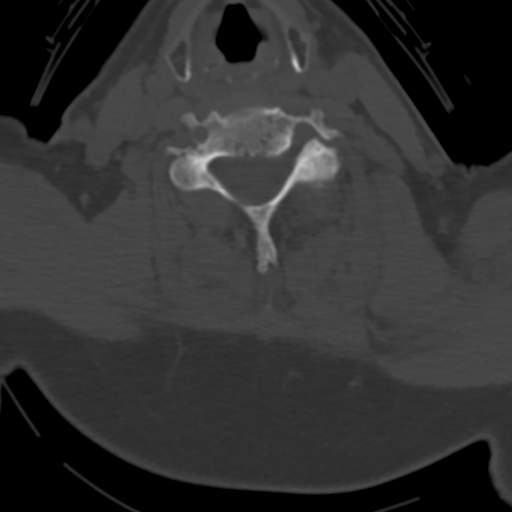
[im 54/81  bone]
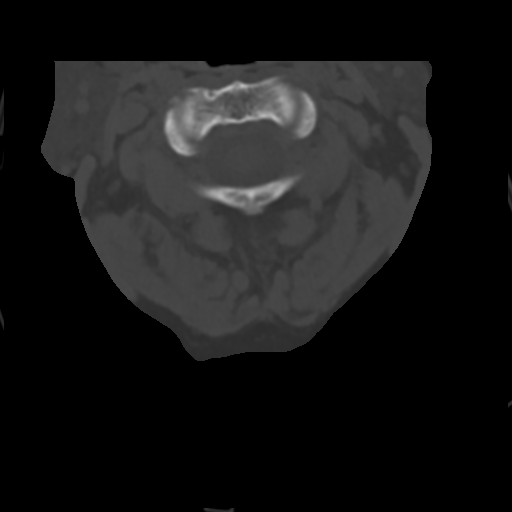

[Series 9: coronals · coronal · 0.31mm/px · 3 of 48 slices shown]
[im 10/48  bone]
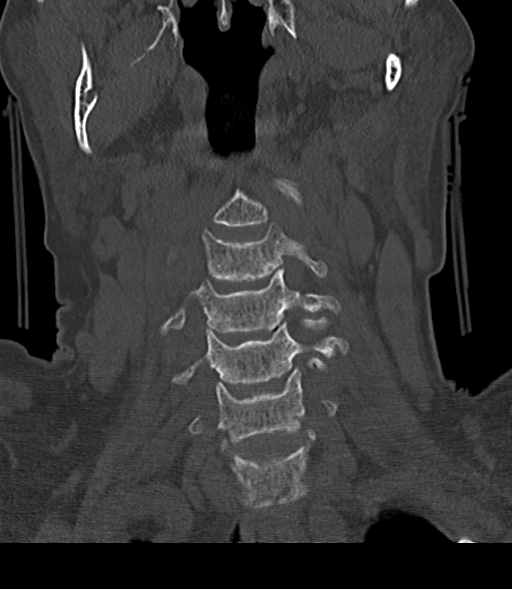
[im 19/48  bone]
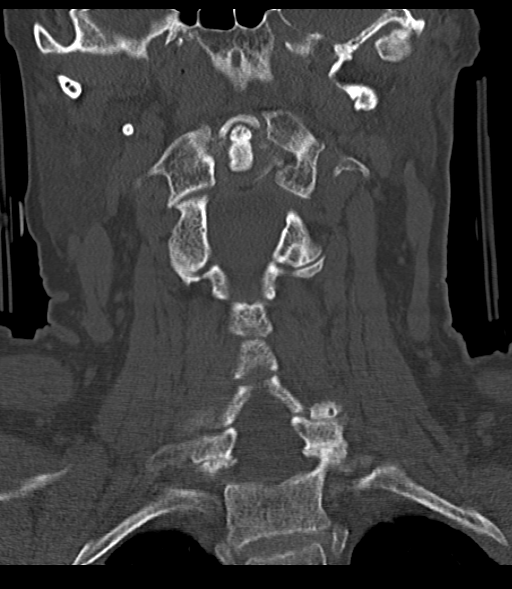
[im 29/48  bone]
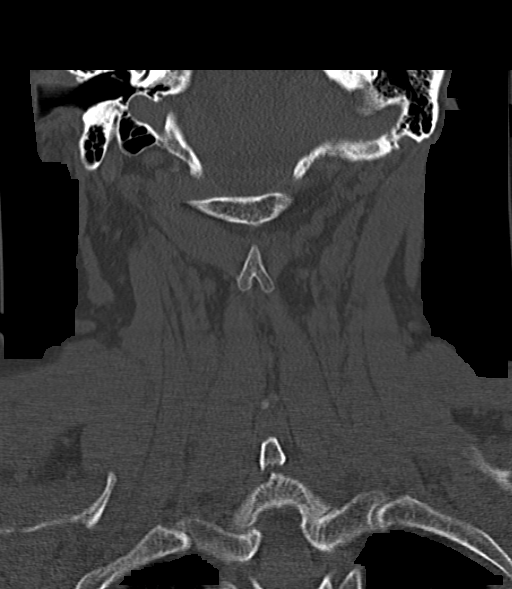

[Series 10: sagittals · sagittal · 0.32mm/px · 5 of 48 slices shown, 6 images]
[im 16/48  bone]
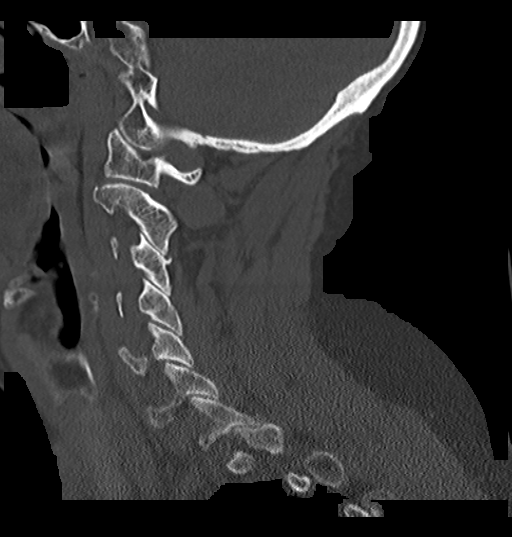
[im 20/48  bone]
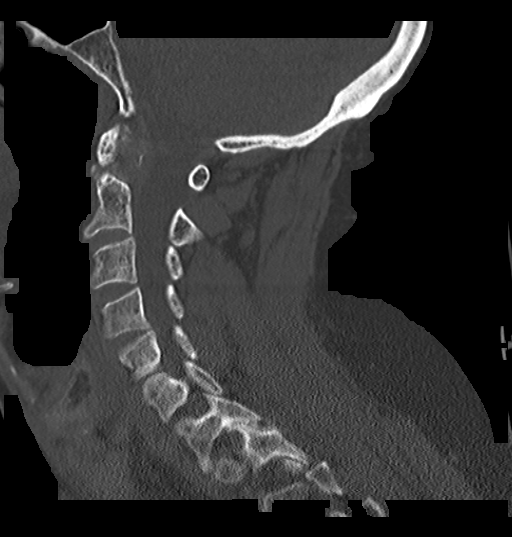
[im 24/48  soft-tissue]
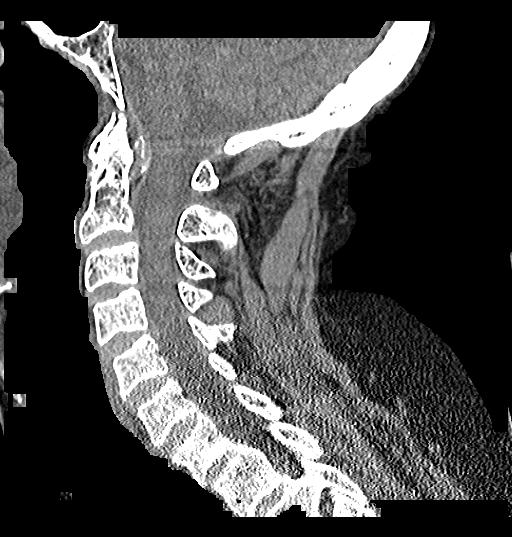
[im 24/48  bone]
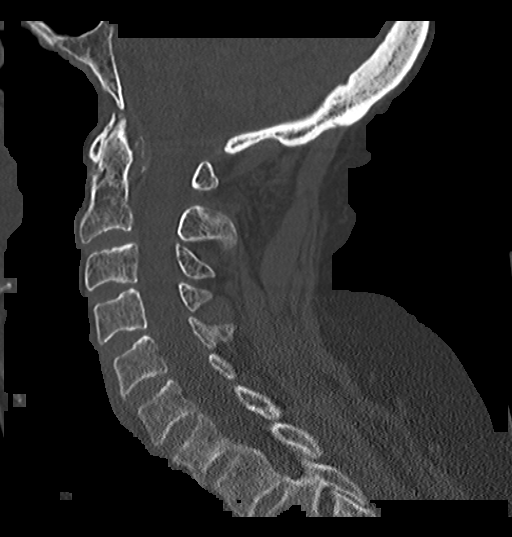
[im 28/48  bone]
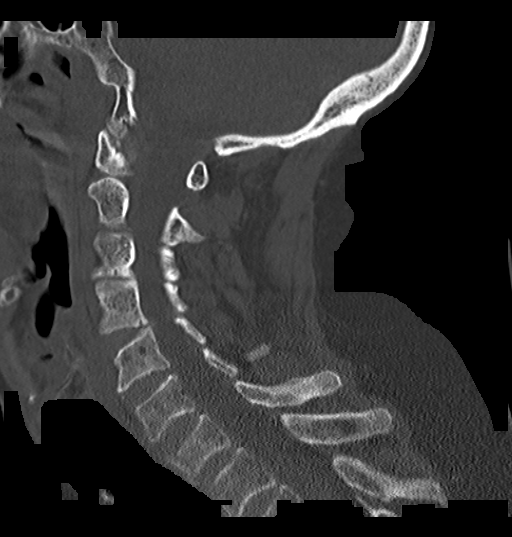
[im 32/48  bone]
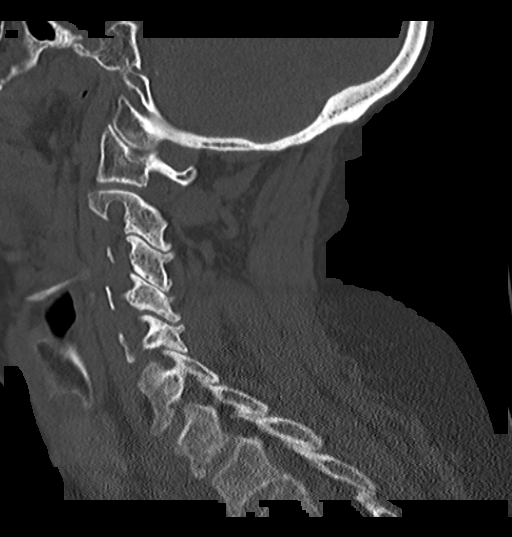

[Series 11: orthogonals · axial · 0.32mm/px · z∈[+1001,+1058]mm · 2 of 90 slices shown]
[im 30/90  bone]
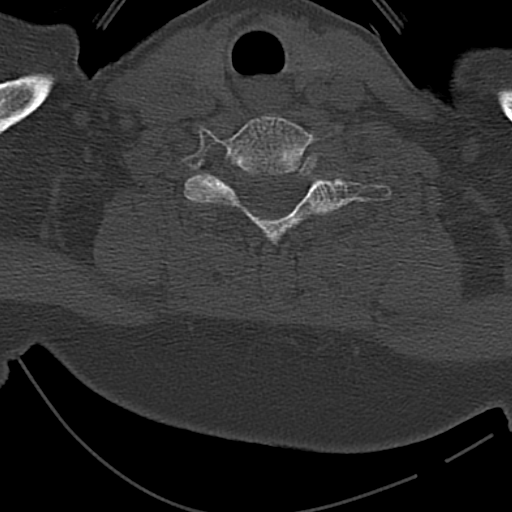
[im 60/90  bone]
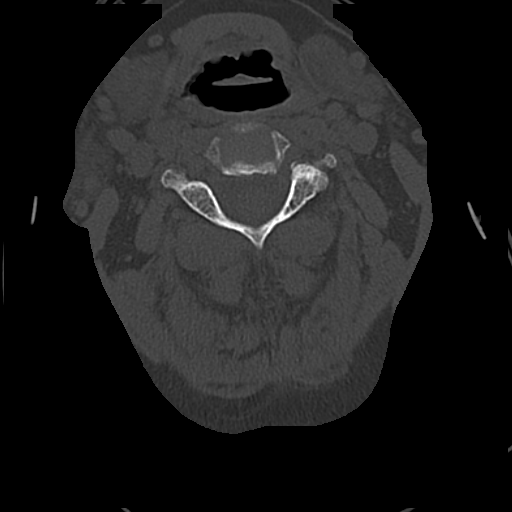

[14 of 33 positions shown; findings below may reference images not displayed]

FINDINGS: CT HEAD FINDINGS

Generalized atrophy with mild progression. Mild chronic
microvascular ischemic change in the white matter. Chronic lacunar
infarction in the left putamen.

Negative for acute infarct.  Negative for hemorrhage or mass.

Left globe retinal hemorrhage unchanged. Bilateral staphyloma of the
globe unchanged.

Negative for skull fracture.

CT CERVICAL SPINE FINDINGS

Mild compression fracture of C7 appears acute. There is depression
of the superior and inferior endplates with a fracture line in the
anterior vertebral body best seen on sagittal images. No other
fracture

Normal alignment. Mild degenerative changes in the cervical spine.
Left-sided facet degeneration throughout the cervical spine. No
significant spinal stenosis.
IMPRESSION: No acute intracranial abnormality

Chronic left retinal hemorrhage unchanged from prior studies

Mild compression fracture of C7 which appears acute. This is not
causing significant spinal stenosis.

Critical Value/emergent results were called by telephone at the time
of interpretation on 10/05/2014 at [DATE] to Dr. GRACYNHA BOHMANN ,
who verbally acknowledged these results.

## 2016-07-09 IMAGING — US US ABDOMEN COMPLETE
1 series · 14 of 25 positions shown · non-contrast
Comparison: Lumbar spine MRI 03/28/2014. CT Abdomen and Pelvis
10/17/2012.

CLINICAL DATA: 74-year-old female with abdominal pain (not
otherwise specified at the time of this report). Initial encounter.

EXAM:
ULTRASOUND ABDOMEN COMPLETE

[Series 1: us abdomen complete · 0.20mm/px · 14 of 56 slices shown]
[im 1/56]
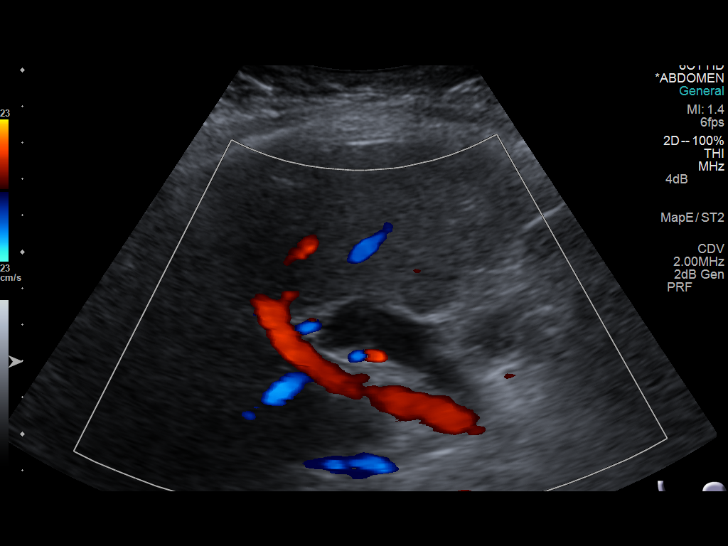
[im 5/56]
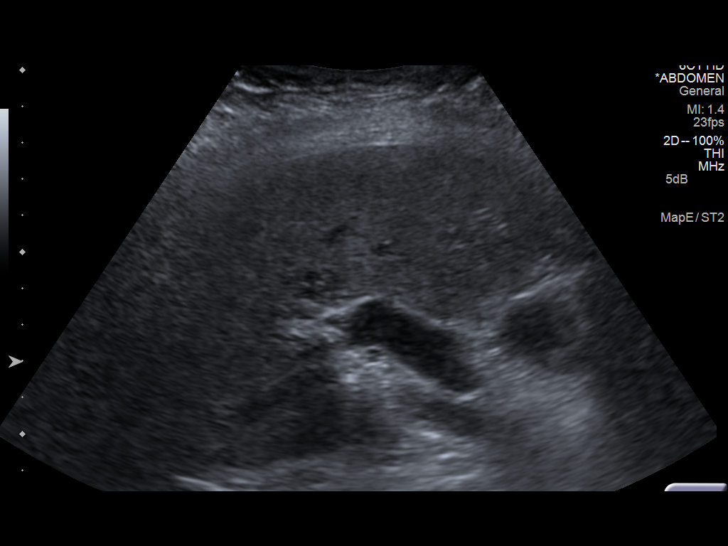
[im 10/56]
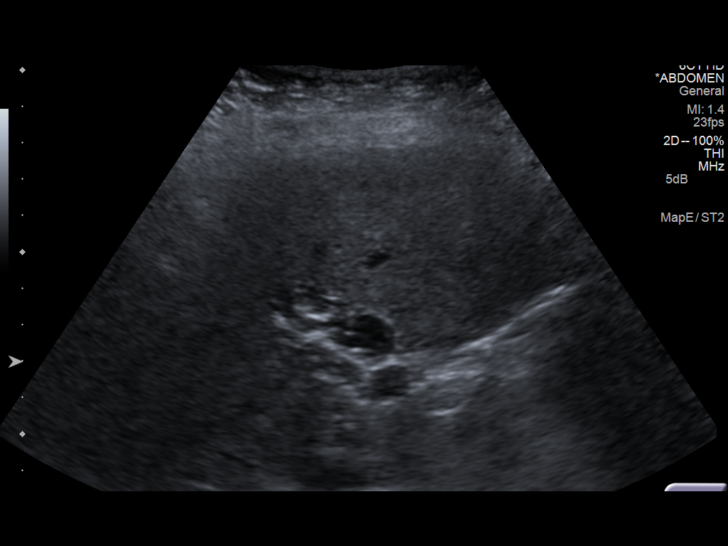
[im 14/56]
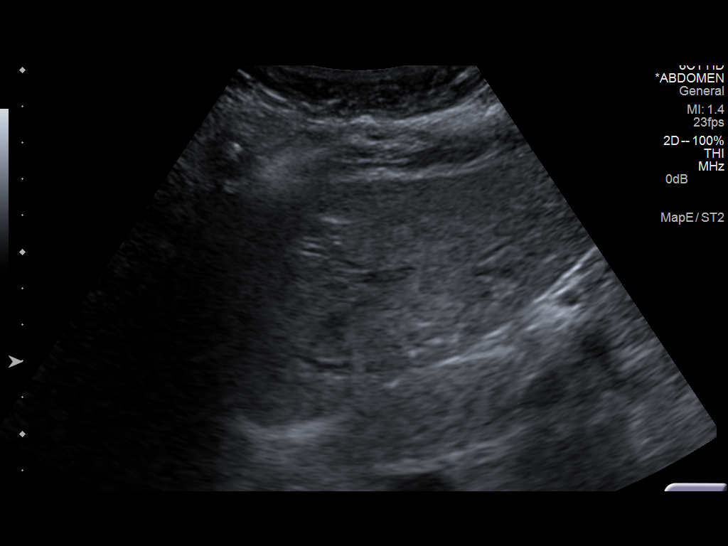
[im 19/56]
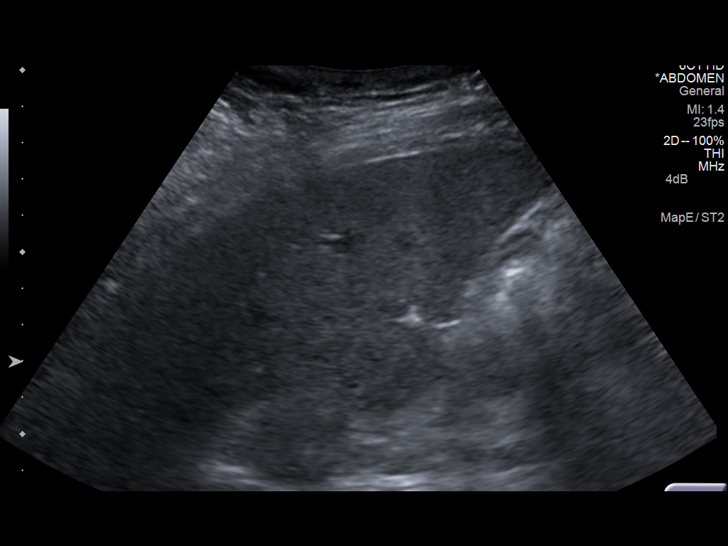
[im 21/56]
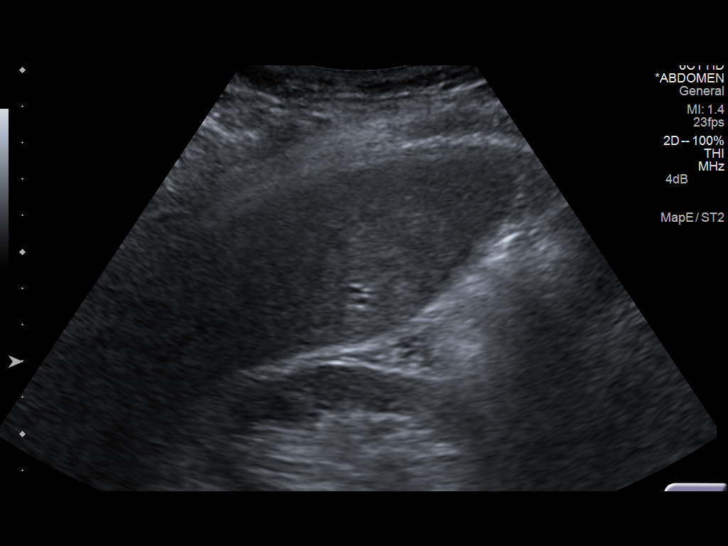
[im 26/56]
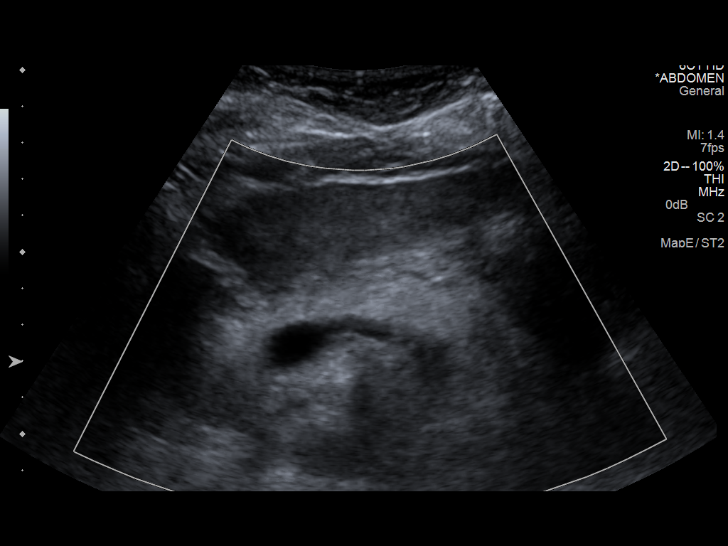
[im 30/56]
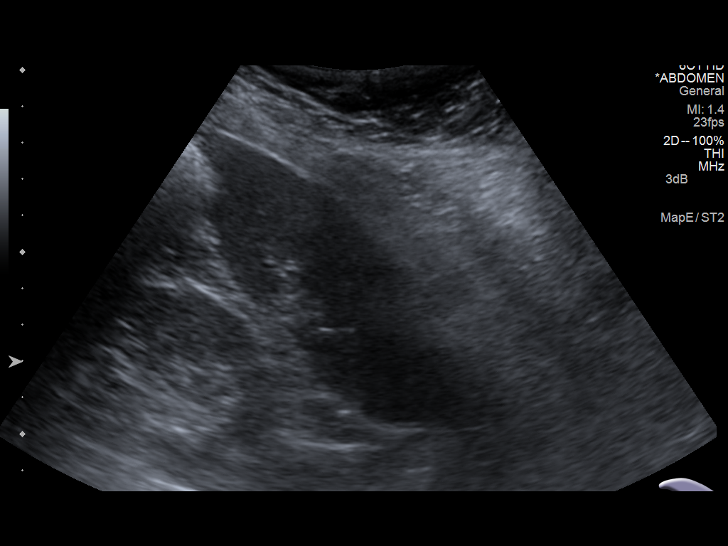
[im 35/56]
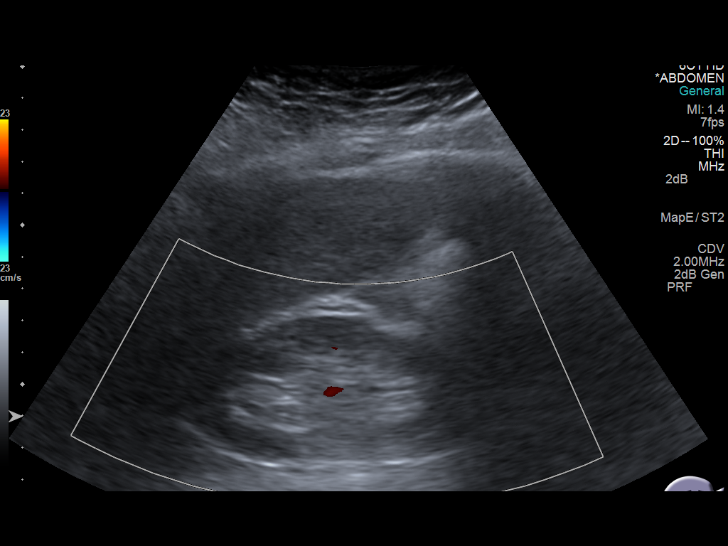
[im 37/56]
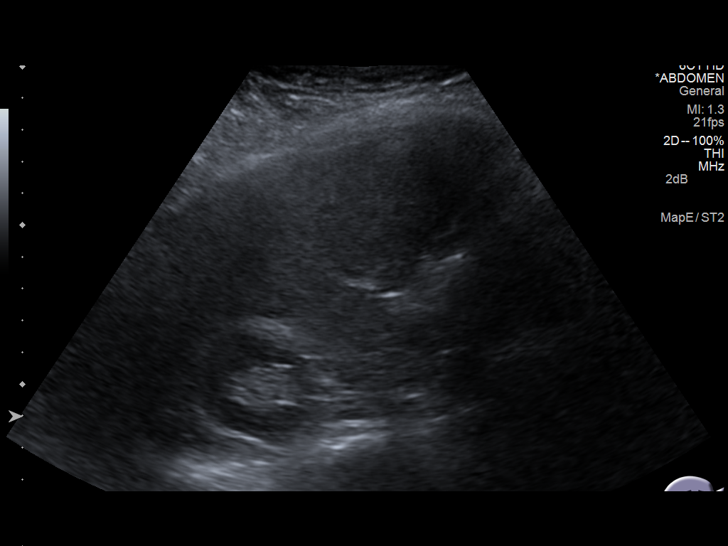
[im 42/56]
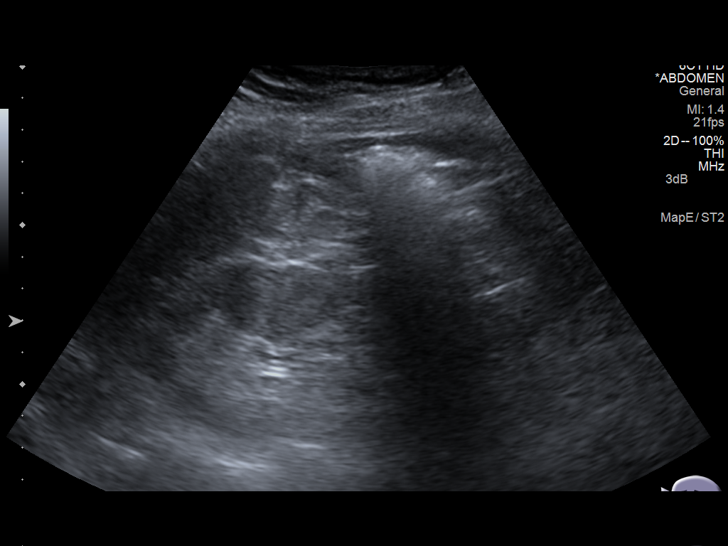
[im 46/56]
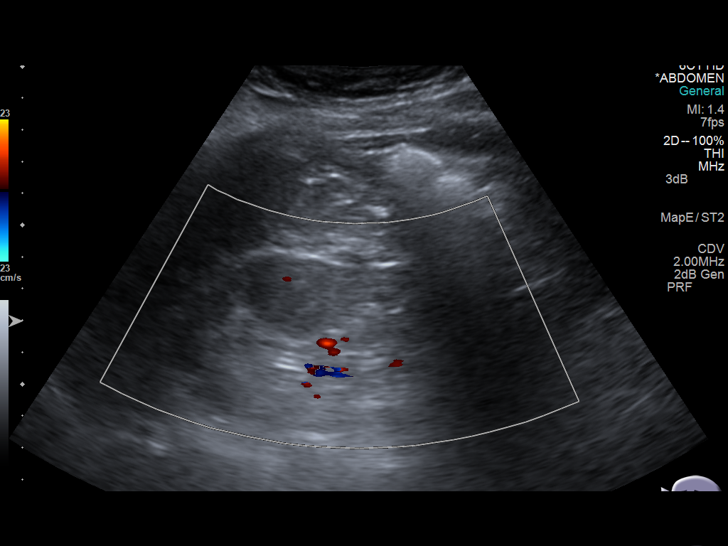
[im 51/56]
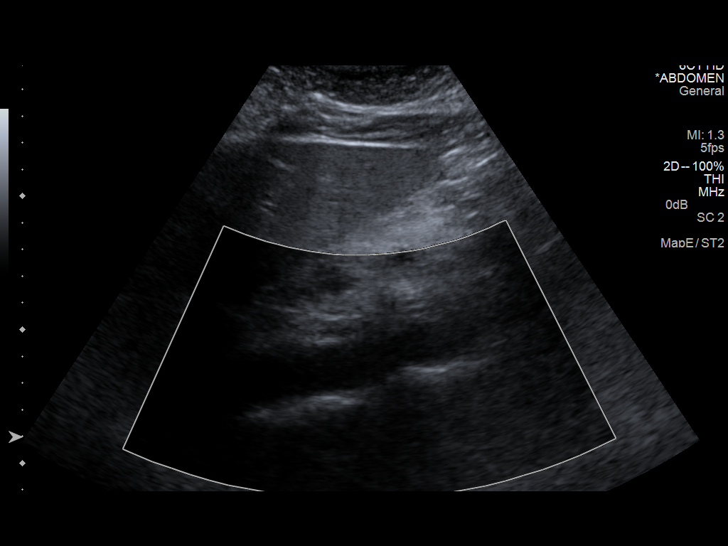
[im 56/56]
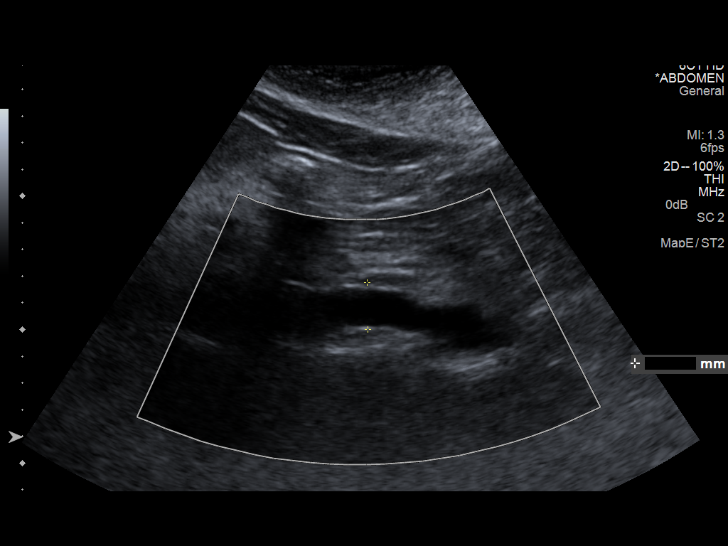

[14 of 25 positions shown; findings below may reference images not displayed]

FINDINGS: Gallbladder: Surgically absent

Common bile duct: Diameter: 11 mm (stable to decreased since 5358)

Liver: Echogenicity within normal limits. Mild intrahepatic biliary
ductal dilatation appears stable since 5358. No discrete liver
lesion.

IVC: No abnormality visualized.

Pancreas: Visualized portion unremarkable.

Spleen: Size and appearance within normal limits.

Right Kidney: Length: 10.0 cm. Echogenicity within normal limits. No
mass or hydronephrosis visualized.

Left Kidney: Length: 10.0 cm. Echogenicity within normal limits. No
mass or hydronephrosis visualized.

Abdominal aorta: No aneurysm visualized.

Other findings: None.
IMPRESSION: No acute findings.  Stable biliary tree status post cholecystectomy.

## 2016-07-09 MED ORDER — HYDROCODONE-ACETAMINOPHEN 7.5-325 MG PO TABS
1.0000 | ORAL_TABLET | Freq: Three times a day (TID) | ORAL | 0 refills | Status: DC
Start: 2016-07-09 — End: 2016-08-15

## 2016-07-09 NOTE — Progress Notes (Signed)
Subjective:    Patient ID: [name], female    DOB: [birth year], 76 y.o.   MRN: [id number]  HPI: [name] is a 76 year old female who returns for follow up appointment and medication refill. She states her pain is located in her neck radiating into her left shoulder,lower-back and right knee. She rates her pain 8. Her current exercise regime is using light weights,walking and performing chair exercises. Also states she fell two weeks ago, she was sitting on her bed and was leaning forward and fell and hit her head on the floor. She didn't seek medical attention, her daughter helped her up.   [name] would like to speak to Dr. Letta Pate regarding the Cervical Injections.   Pain Inventory Average Pain 9 Pain Right Now 9 My pain is constant, sharp, burning, stabbing and aching  In the last 24 hours, has pain interfered with the following? General activity 10 Relation with others 7 Enjoyment of life 10 What TIME of day is your pain at its worst? all Sleep (in general) Poor  Pain is worse with: walking, bending, sitting, inactivity, standing, unsure and some activites Pain improves with: medication Relief from Meds: 5  Mobility walk without assistance ability to climb steps?  no do you drive?  no  Function disabled: date disabled 53 I need assistance with the following:  household duties and shopping  Neuro/Psych weakness tremor spasms depression anxiety  Prior Studies Any changes since last visit?  no  Physicians involved in your care Any changes since last visit?  no   Family History  Problem Relation Age of Onset  . Heart failure Mother   . Heart attack Father    Social History   Social History  . Marital status: Widowed    Spouse name: N/A  . Number of children: 3  . Years of education: middle sch   Occupational History  . retired     n/a   Social History Main Topics  . Smoking status: Former Smoker    Packs/day: 1.50   Years: 30.00  . Smokeless tobacco: Never Used     Comment: quit 2013  . Alcohol use No  . Drug use: No  . Sexual activity: No   Other Topics Concern  . Not on file   Social History Narrative   ** Merged History Encounter **       Patient lives at home with daughter. Caffeine Use: 4 cups daily   Past Surgical History:  Procedure Laterality Date  . ABDOMINAL HYSTERECTOMY    . ABDOMINAL SURGERY    . APPENDECTOMY    . BLADDER SURGERY    . CHOLECYSTECTOMY    . COLON SURGERY    . EYE SURGERY     cataracts  . JOINT REPLACEMENT    . KNEE SURGERY    . OSTOMY    . RECTOCELE REPAIR     Past Medical History:  Diagnosis Date  . Anemia   . B12 deficiency   . Bladder incontinence   . Blind left eye   . Cataract   . Cervical spine fracture ([place])   . Compression fracture    C7,  upper T spine -compression fx  . COPD (chronic obstructive pulmonary disease) ([place])   . DDD (degenerative disc disease), lumbar   . Depression    major  . GERD (gastroesophageal reflux disease)   . Hiatal hernia   . Hyperlipidemia   . Hypertension   . Macular  degeneration   . Osteoarthritis   . Osteoporosis   . Reactive airway disease   . Rupture of bowel ([place])   . Stroke [facility])    "mini stroke"  . Stroke [facility])    There were no vitals taken for this visit.  Opioid Risk [name]:   Fall Risk [name]:  `1  Depression screen PHQ 2/9  Depression screen PHQ 2/9 03/24/2015  Decreased Interest 2  Down, Depressed, Hopeless 3  PHQ - 2 [name] 5  Altered sleeping 3  Tired, decreased energy 3  Change in appetite 2  Feeling bad or failure about yourself  2  Trouble concentrating 1  Moving slowly or fidgety/restless 0  Suicidal thoughts 0  PHQ-9 [name] 16  Difficult doing work/chores Somewhat difficult    Review of Systems  Constitutional: Negative.   HENT: Negative.   Eyes: Negative.   Respiratory: Positive for cough.   Cardiovascular: Negative.   Gastrointestinal: Positive for nausea and  vomiting.  Endocrine: Negative.   Genitourinary: Negative.   Musculoskeletal: Negative.   Allergic/Immunologic: Negative.   Neurological: Positive for tremors.  Hematological: Negative.   Psychiatric/Behavioral: Negative.   All other systems reviewed and are negative.      Objective:   Physical Exam  Constitutional: She is oriented to person, place, and time. She appears well-developed and well-nourished.  HENT:  Head: Normocephalic and atraumatic.  Neck: Normal range of motion. Neck supple.  Cervical Paraspinal Tenderness: C-5-C-6  Cardiovascular: Normal rate and regular rhythm.   Pulmonary/Chest: Effort normal and breath sounds normal.  Musculoskeletal:  Normal Muscle Bulk and Muscle Testing Reveals: Upper Extremities: Full ROM and Muscle Strength 5/5 Left AC Joint Tenderness Thoracic Paraspinal Tenderness: T-1-T-3 Lumbar Paraspinal Tenderness: L-3-L-5 Lower Extremities: Full ROM and Muscle Strength 5/5 Arises from table slowly Narrow Based Gait  Neurological: She is alert and oriented to person, place, and time.  Skin: Skin is warm and dry.  Psychiatric: She has a normal mood and affect.  Nursing note and vitals reviewed.         Assessment & Plan:  1. Complex multifactorial pain left side neck and Left shoulder.3 main etiologies: 07/09/2016 A. myofascial pain left trapezius, left infraspinatus. Continue to Monitor B. Cervical spinal stenosis with left C4-C5 radiculopathy. Continue Gabapentin C. Cervical spondylosis with facet arthropathy Refilled: Hydrocodone 7.5/325 mg one tablet three times a day as needed for moderate pain #90.  We will continue the opioid monitoring program, this consists of regular clinic visits, examinations, urine drug screen, pill counts as well as use of [place] Controlled Substance Reporting System. 2. Cervical Radiculopathy: Continue Gabapentin. 07/09/2016 3. Chronic Low Back Pain: Continue current medication regime 4.  Constipation: Continue Senna and Miralax. 07/09/2016  20 minutes of face to face patient care time was spent during this visit. All questions were encouraged and answered.  F/U in 1 month

## 2016-07-11 ENCOUNTER — Other Ambulatory Visit: Payer: Self-pay | Admitting: Internal Medicine

## 2016-07-11 DIAGNOSIS — M542 Cervicalgia: Secondary | ICD-10-CM | POA: Diagnosis not present

## 2016-07-11 DIAGNOSIS — R0981 Nasal congestion: Secondary | ICD-10-CM | POA: Diagnosis not present

## 2016-07-11 DIAGNOSIS — G4452 New daily persistent headache (NDPH): Secondary | ICD-10-CM

## 2016-07-11 DIAGNOSIS — R51 Headache: Secondary | ICD-10-CM | POA: Diagnosis not present

## 2016-07-11 DIAGNOSIS — F419 Anxiety disorder, unspecified: Secondary | ICD-10-CM | POA: Diagnosis not present

## 2016-07-11 DIAGNOSIS — F324 Major depressive disorder, single episode, in partial remission: Secondary | ICD-10-CM | POA: Diagnosis not present

## 2016-07-11 IMAGING — DX DG CHEST 2V
2 series · 2 of 2 positions shown · non-contrast
Comparison: October 05, 2014.

CLINICAL DATA: Fever.

EXAM:
CHEST  2 VIEW

[chest lat]
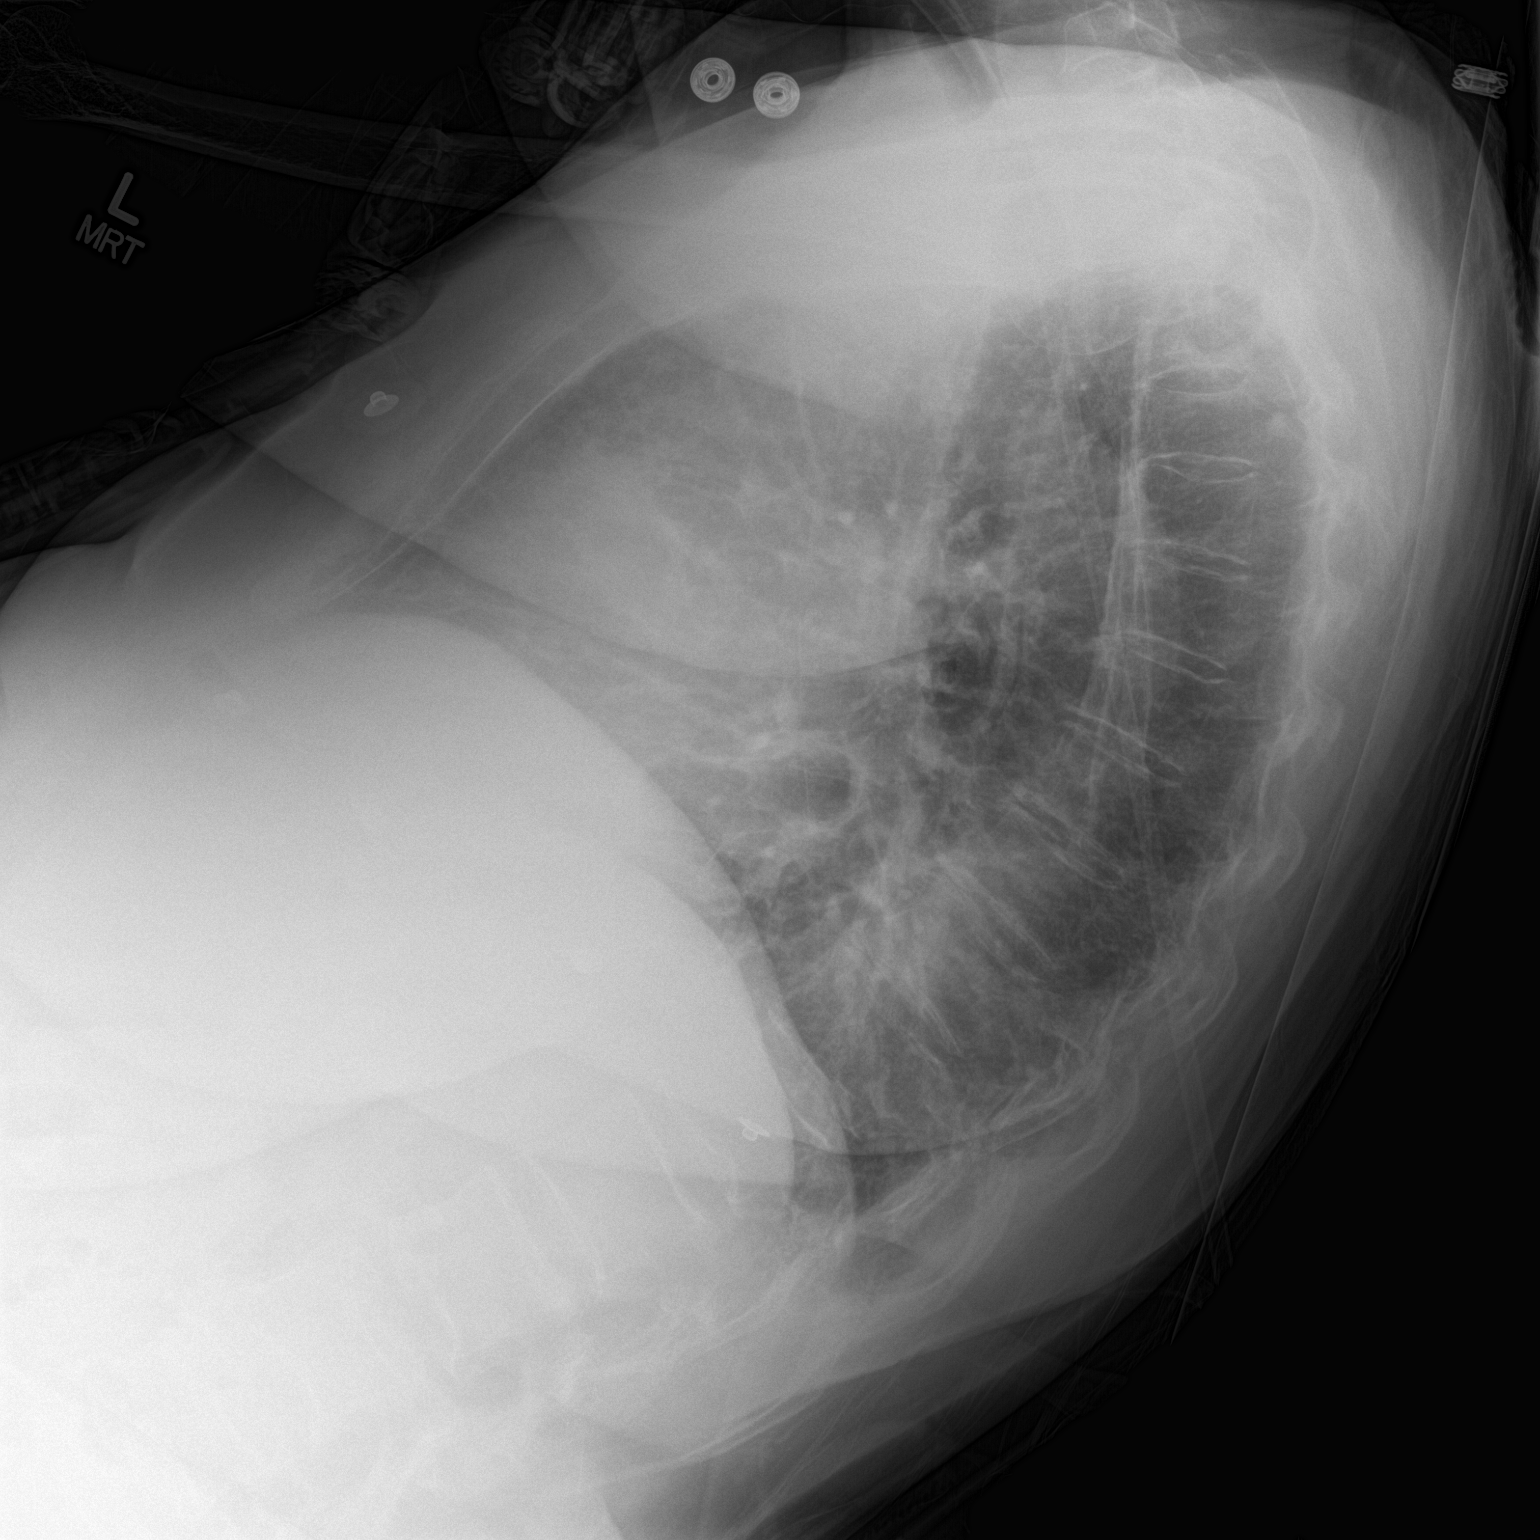

[chest ap]
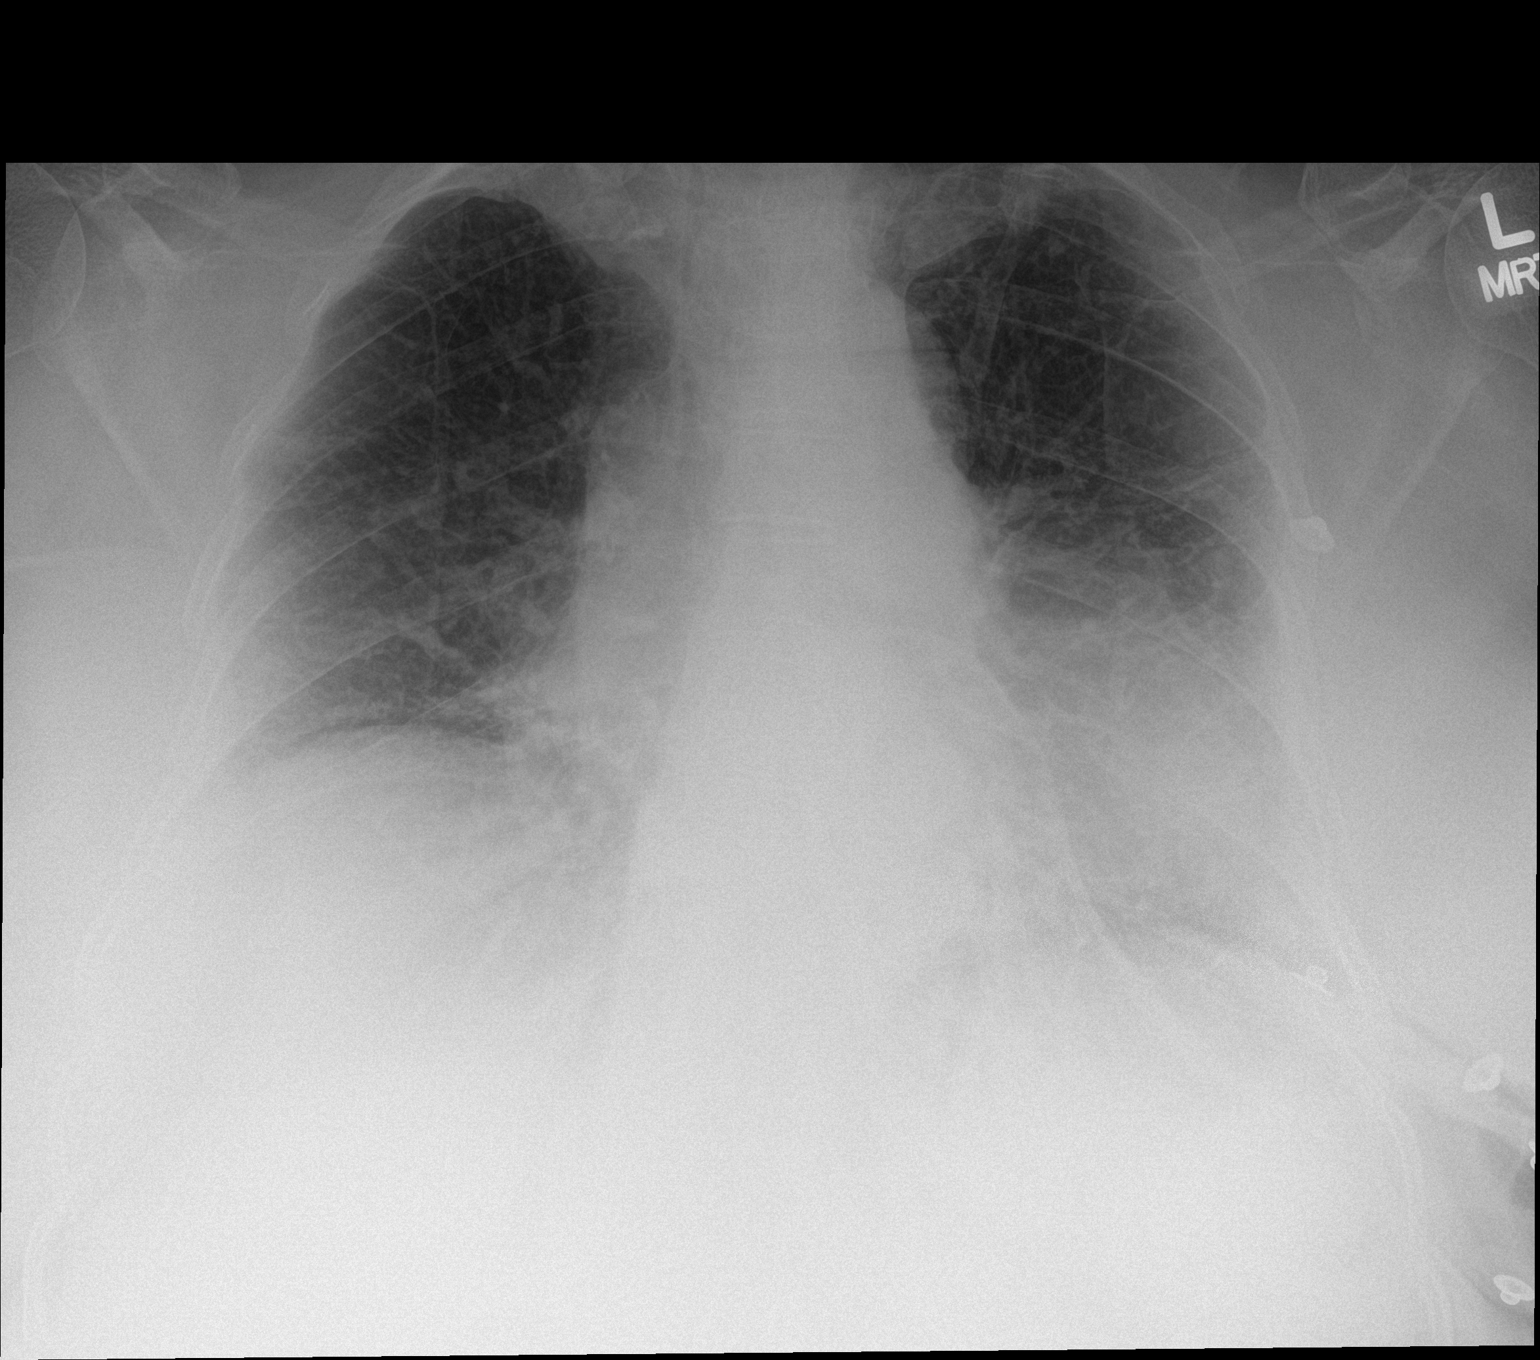

[2 of 2 positions shown; findings below may reference images not displayed]

FINDINGS: Stable cardiomediastinal silhouette. Both lungs are clear. No
pneumothorax or pleural effusion is noted. The visualized skeletal
structures are unremarkable.
IMPRESSION: No active cardiopulmonary disease.

## 2016-07-12 ENCOUNTER — Ambulatory Visit
Admission: RE | Admit: 2016-07-12 | Discharge: 2016-07-12 | Disposition: A | Discharge status: Home / Self Care | Payer: Medicare Other | Source: Ambulatory Visit | Attending: Internal Medicine | Admitting: Internal Medicine

## 2016-07-12 DIAGNOSIS — R51 Headache: Secondary | ICD-10-CM | POA: Diagnosis not present

## 2016-07-12 DIAGNOSIS — G4452 New daily persistent headache (NDPH): Secondary | ICD-10-CM

## 2016-07-12 IMAGING — MR MR THORACIC SPINE W/O CM
6 of 16 series · 17 of 48 positions shown · non-contrast
Comparison: CT of the cervical spine October 05, 2014

CLINICAL DATA: Known cervical spine fracture, new onset fever. Here
for assessment of sepsis.

EXAM:
MRI CERVICAL AND LUMBAR SPINE WITHOUT CONTRAST
TECHNIQUE: Multiplanar and multiecho pulse sequences of the cervical spine, to
include the craniocervical junction and cervicothoracic junction,
and thoracic spine, were obtained without intravenous contrast.
Please note, motion degraded examination. Per technologist note,
patient did not wish to receive intravenous contrast due to pain,
examination is noncontrast.

[Series 3: T2 · sagittal · 3.0mm · 0.43mm/px · 2 of 14 slices shown (1 of 4)]
[im 1/14]
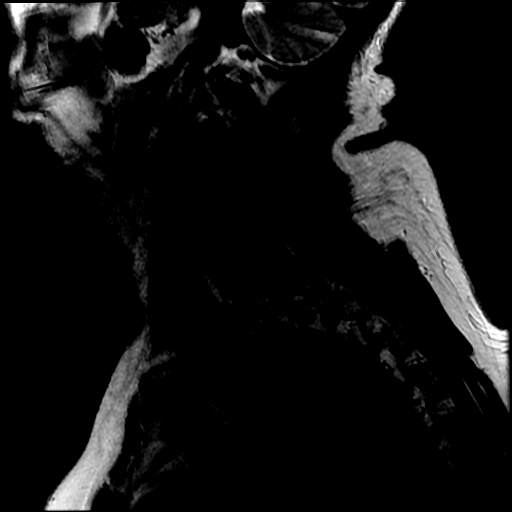
[im 14/14]
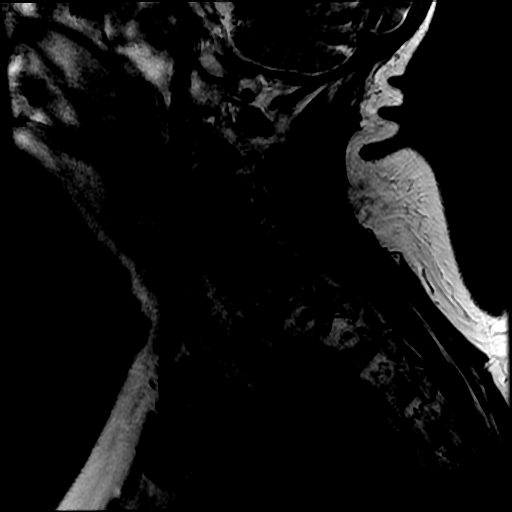

[Series 5: T1 · sagittal · 3.0mm · 0.43mm/px · 2 of 14 slices shown (1 of 2)]
[im 1/14]
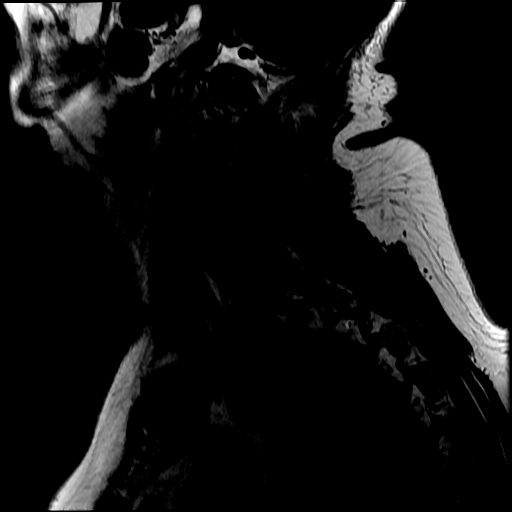
[im 14/14]
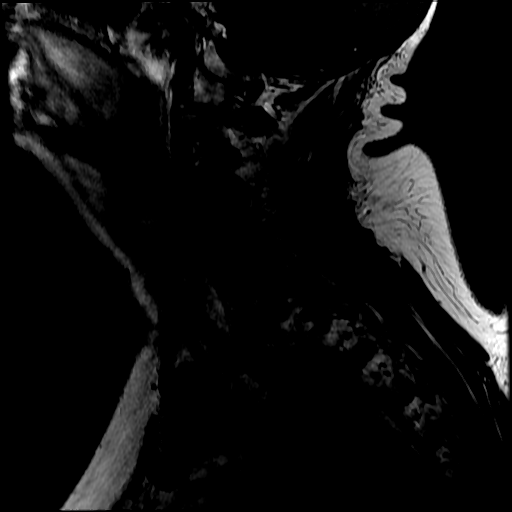

[Series 8: T2 · axial · 3.5mm · 0.39mm/px · z∈[-77,+12]mm · 4 of 28 slices shown (2 of 4)]
[im 1/28]
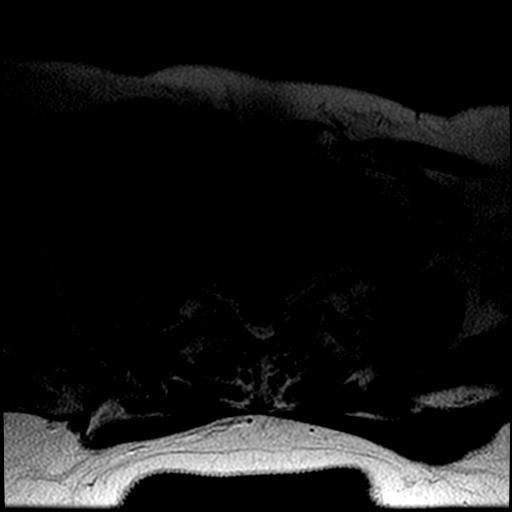
[im 10/28]
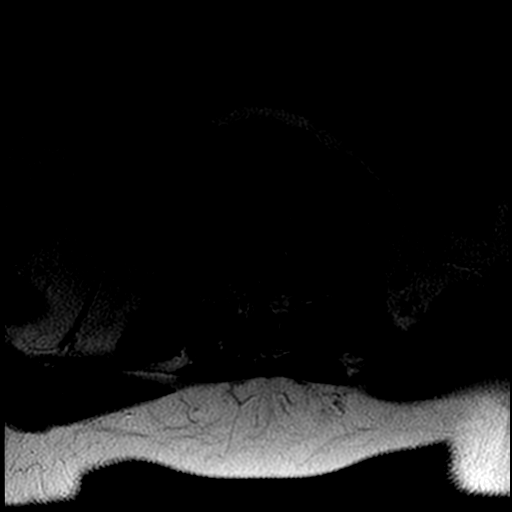
[im 19/28]
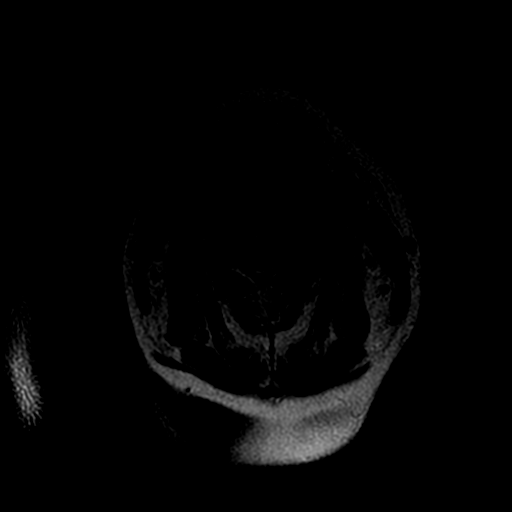
[im 28/28]
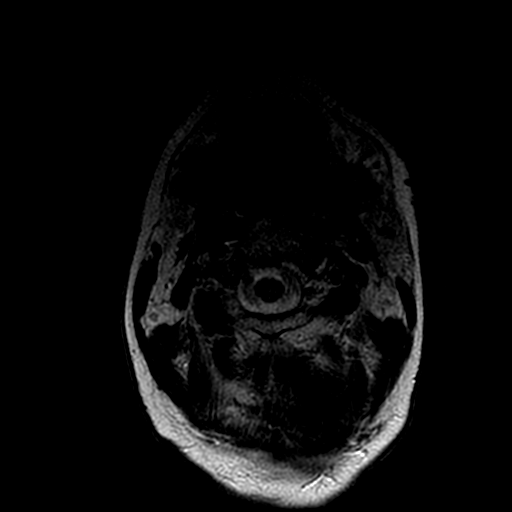

[Series 9: T1 · axial · non-contrast · 3.5mm · 0.39mm/px · z∈[-77,-47]mm · 2 of 28 slices shown (2 of 2)]
[im 1/28]
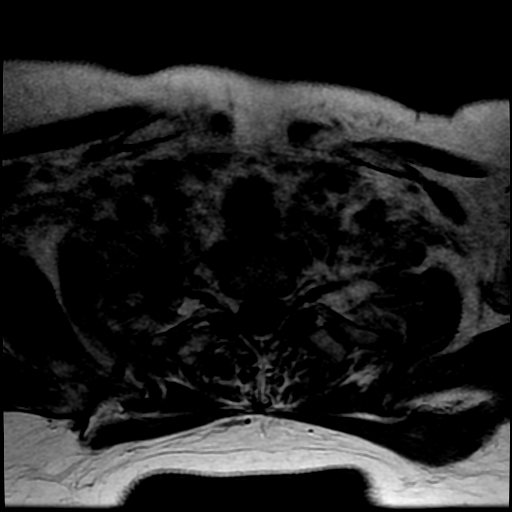
[im 10/28]
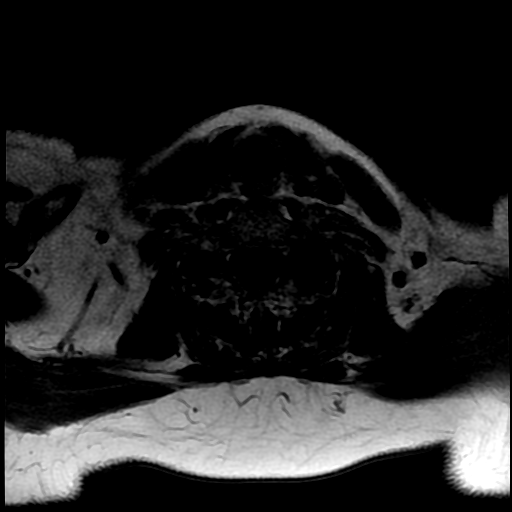

[Series 13: T2 · sagittal · 3.0mm · 0.62mm/px · 2 of 14 slices shown (3 of 4)]
[im 1/14]
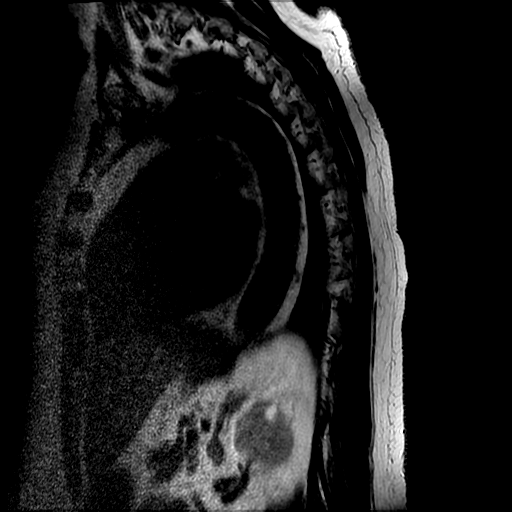
[im 14/14]
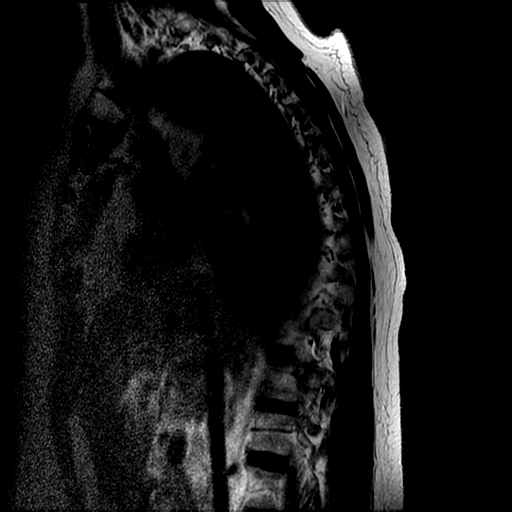

[Series 18: T2 · axial · 4.0mm · 0.43mm/px · z∈[-275,-80]mm · 5 of 40 slices shown (4 of 4)]
[im 1/40]
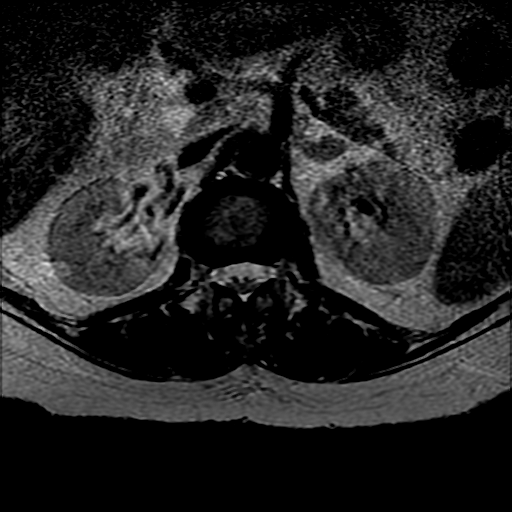
[im 10/40]
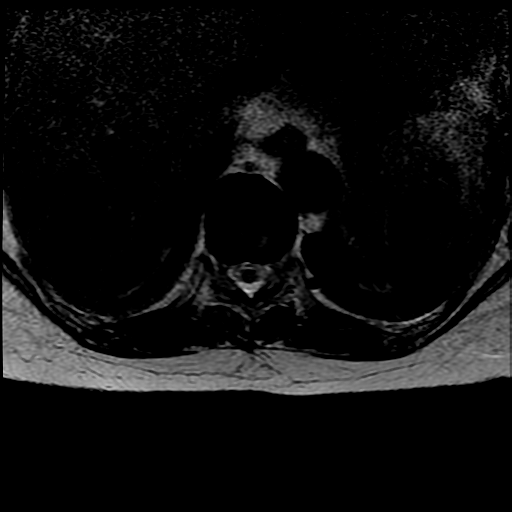
[im 20/40]
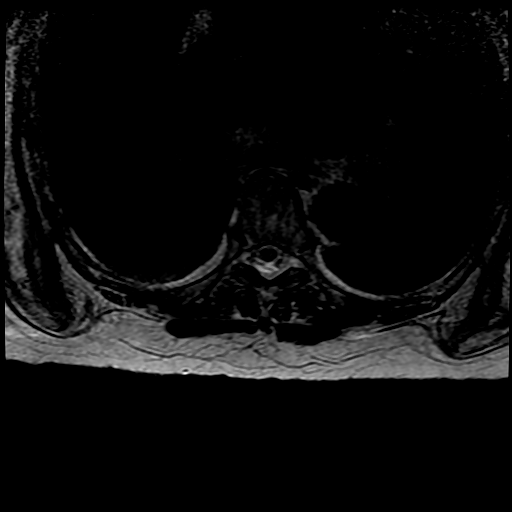
[im 30/40]
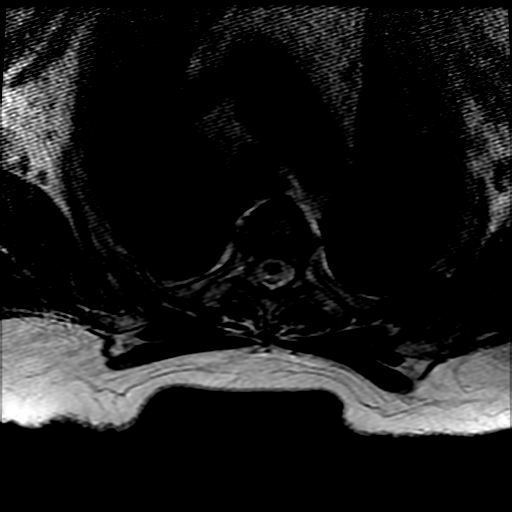
[im 40/40]
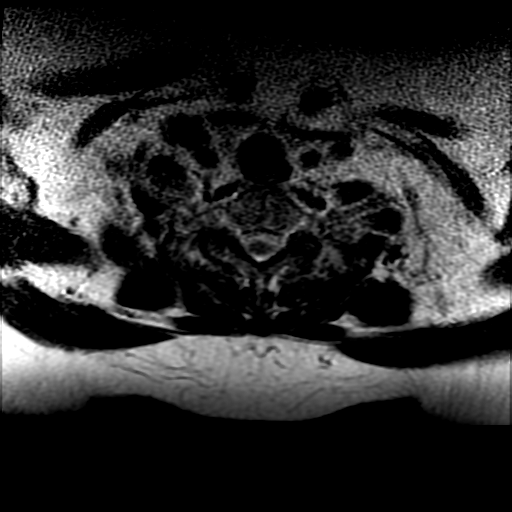

[17 of 48 positions shown; findings below may reference images not displayed]

FINDINGS: MRI CERVICAL SPINE FINDINGS

Motion degraded examination.

Moderate C7 compression fracture, low T1 and bright T2 signal with
30-50% height loss. No retropulsed bony fragments. Remaining
cervical vertebral bodies appear intact. Maintenance of cervical
lordosis. Intervertebral discs demonstrate normal morphology,
decreased T2 signal within all cervical disc most consistent with
desiccation.

Low signal measuring up to 6 mm in AP dimension posterior to the
odontoid process consistent with pannus, can be seen with CPPD
without erosions or edema. Cervical spinal cord appears normal
morphology and signal characteristics though, motion degrades
sensitivity for subtle cord signal abnormality. No syrinx. Included
prevertebral and paraspinal soft tissues are normal.

Level by level evaluation:

C2-3: Moderate facet arthropathy. No disc bulge, canal stenosis or
neural foraminal narrowing.

C3-4: Annular bulging, uncovertebral hypertrophy. Moderate LEFT
greater than RIGHT facet arthropathy. Very mild canal stenosis. Mild
LEFT neural foraminal narrowing.

C4-5: Small broad-based disc bulge, mild to moderate facet
arthropathy and uncovertebral hypertrophy. Moderate canal stenosis.
Moderate to severe LEFT greater than RIGHT neural foraminal
narrowing.

C5-6: Small broad-based disc bulge, uncovertebral hypertrophy and
moderate facet arthropathy. Mild canal stenosis. Moderate RIGHT,
moderate to severe LEFT neural foraminal narrowing.

C6-7: Severely limited by motion and habitus. No definite disc
bulge. At least moderate facet arthropathy without canal stenosis or
neural foraminal narrowing.

C7-T1: No definite disc bulge, canal stenosis. At least mild facet
arthropathy without neural foraminal narrowing.

MRI THORACIC SPINE FINDINGS

Moderate acute appearing T2 compression fracture with 30-50% height
loss. Mild acute appearing T3 compression fracture. The remaining
thoracic vertebral bodies appear intact. Maintenance of thoracic
kyphosis. Intervertebral discs demonstrate relatively preserved
morphology and signal characteristics. Minimal subacute to chronic
discogenic endplate change of lower thoracic spine. 13 mm T6
hemangioma. Scattered chronic Schmorl's nodes.

Thoracic spinal cord appears normal in morphology and signal
characteristics of the level the conus medullaris which terminates
at T12-L1. Motion decreases sensitivity for potential cord signal
abnormality.

No significant disc bulge, canal stenosis or neural foraminal
narrowing any thoracic level.
IMPRESSION: Please note, examination was ordered with contrast though, after
noncontrast portion, patient was unable to tolerate imaging due to
pain and contrast-enhanced sequences not performed.

MRI CERVICAL SPINE: Acute moderate C7 compression fracture. No
malalignment.

Degenerative cervical spine result in moderate canal stenosis at
C4-5, mild at C5-6. Multilevel neural foraminal narrowing: Moderate
to severe on the LEFT at C4-5 and C5-6.

MRI THORACIC SPINE: Acute moderate T2 compression fracture. Mild
acute T3 compression fracture. No malalignment.

By: Keyli Tiger

## 2016-07-26 DIAGNOSIS — R079 Chest pain, unspecified: Secondary | ICD-10-CM | POA: Diagnosis not present

## 2016-07-29 IMAGING — CR DG CHEST 2V
2 series · 2 of 2 positions shown · non-contrast
Comparison: 09/28/2014

CLINICAL DATA: Vomiting for 2 weeks with eating, headache for 1
week, cervical spine fractures 3 weeks ago, hypertension, COPD,
asthma, stroke

EXAM:
CHEST  2 VIEW

[chest pa]
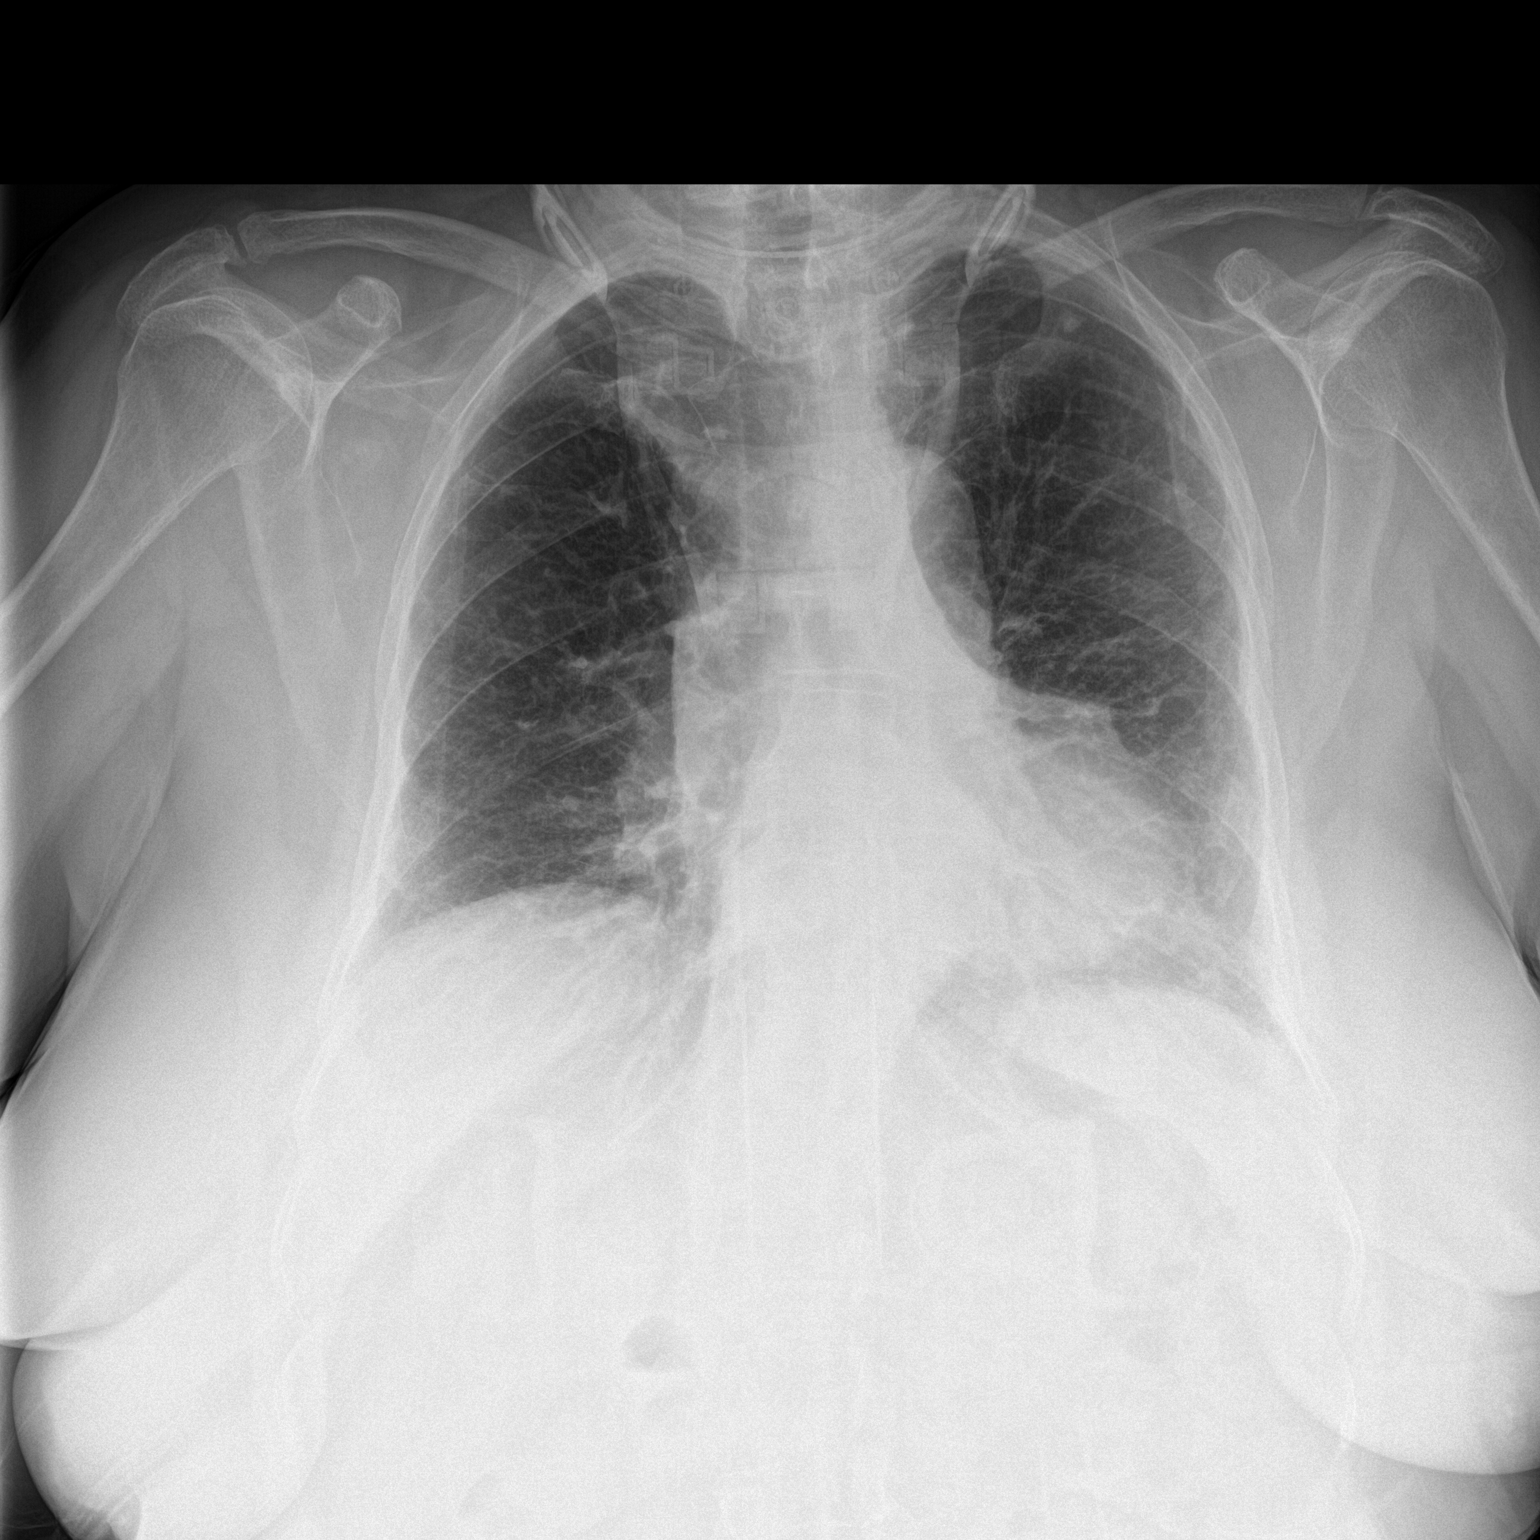

[chest lat]
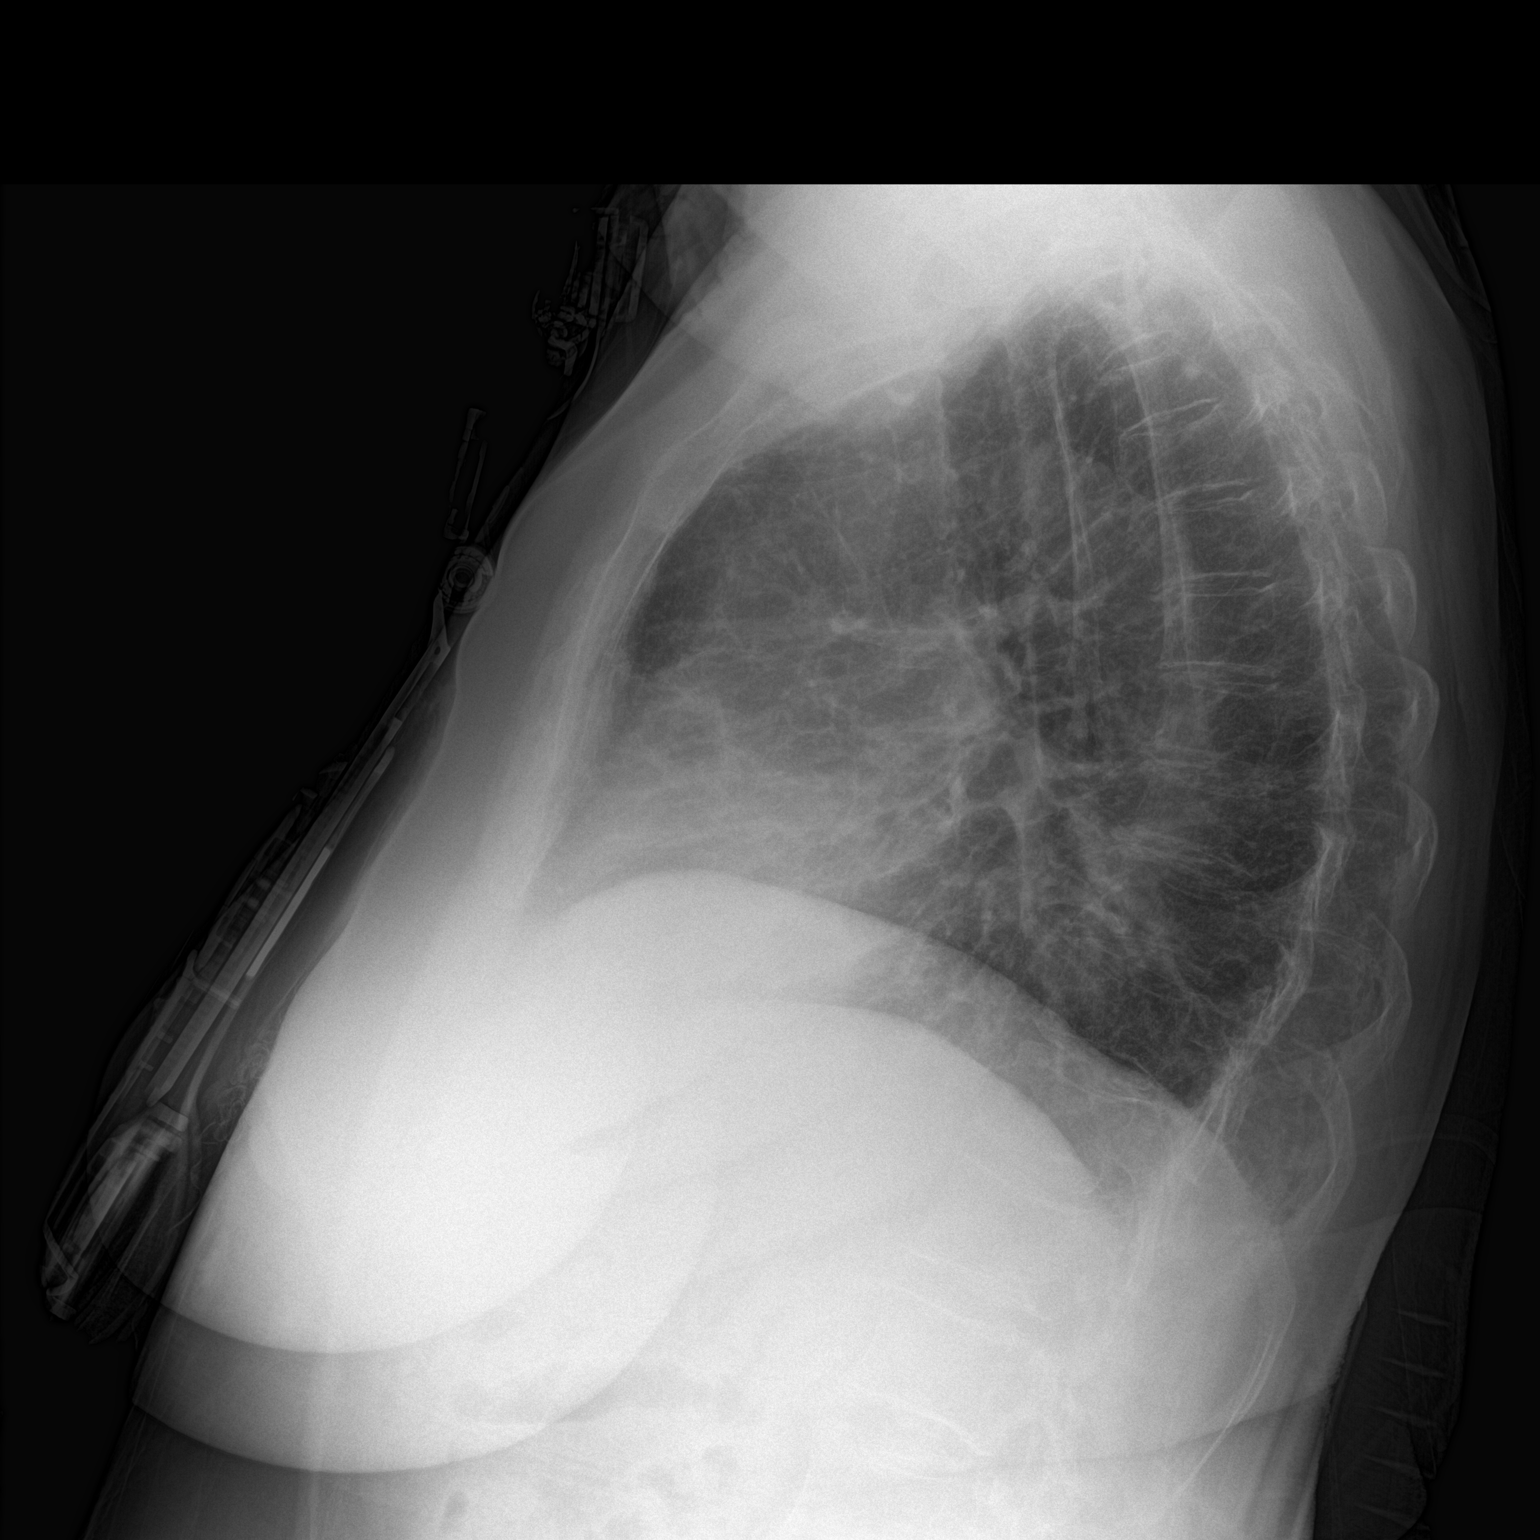

[2 of 2 positions shown; findings below may reference images not displayed]

FINDINGS: Enlargement of cardiac silhouette.

Tortuous aorta.

Pulmonary vascularity normal.

Bibasilar atelectasis and accentuation of interstitial markings.

Calcified granuloma LEFT upper lobe.

No definite infiltrate, pleural effusion or pneumothorax.

Bones diffusely demineralized.

Artifacts at lung apices from cervical spine collar.
IMPRESSION: Enlargement of cardiac silhouette with bibasilar atelectasis and
chronic accentuation of interstitial markings.

## 2016-07-29 IMAGING — CT CT HEAD W/O CM
1 series · 16 of 30 positions shown, 20 images · non-contrast
Comparison: 09/22/2014

CLINICAL DATA: Headache, imbalance, syncopal episode today at
doctor's office

EXAM:
CT HEAD WITHOUT CONTRAST
TECHNIQUE: Contiguous axial images were obtained from the base of the skull
through the vertex without intravenous contrast.

[Series 2: head 5.0 h30s · axial · 0.46mm/px · z∈[-184,-44]mm · 16 of 32 slices shown, 20 images]
[im 2/32  brain]
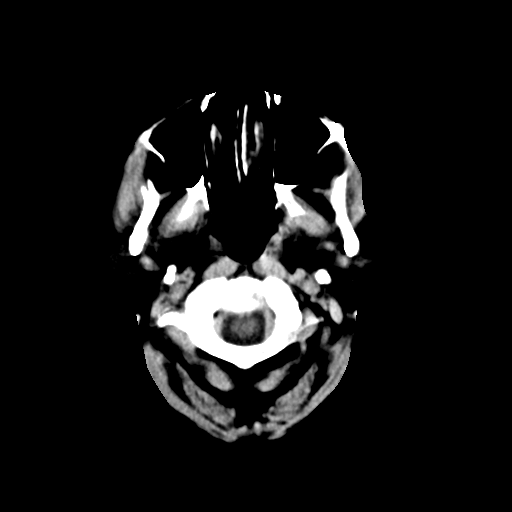
[im 2/32  bone]
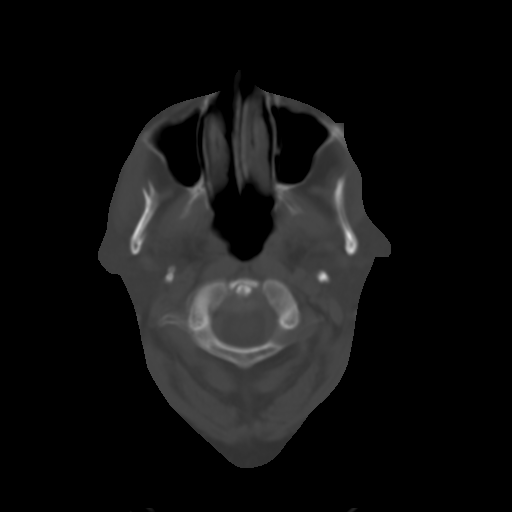
[im 4/32  brain]
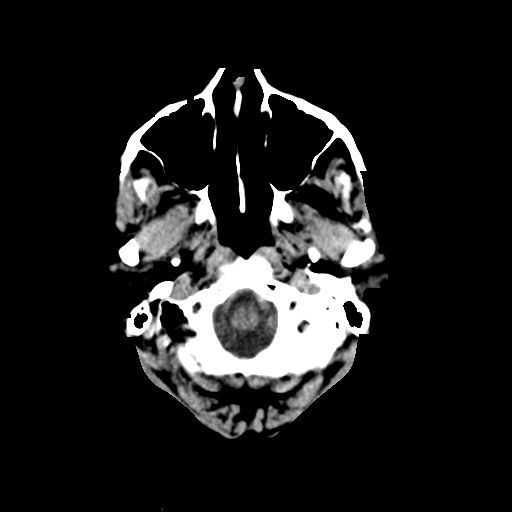
[im 6/32  brain]
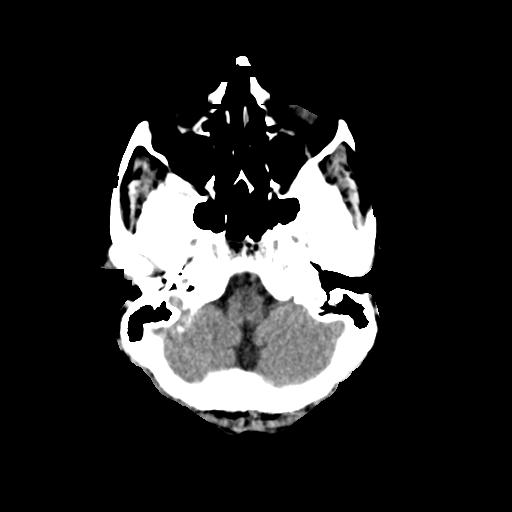
[im 8/32  brain]
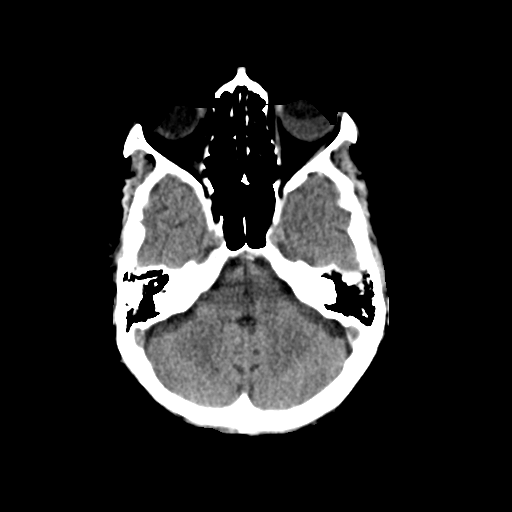
[im 9/32  brain]
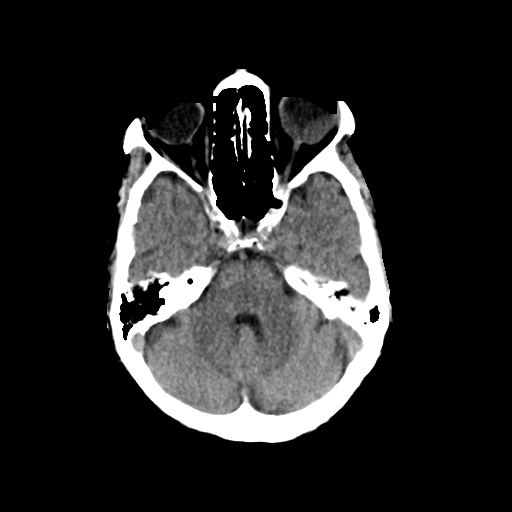
[im 9/32  bone]
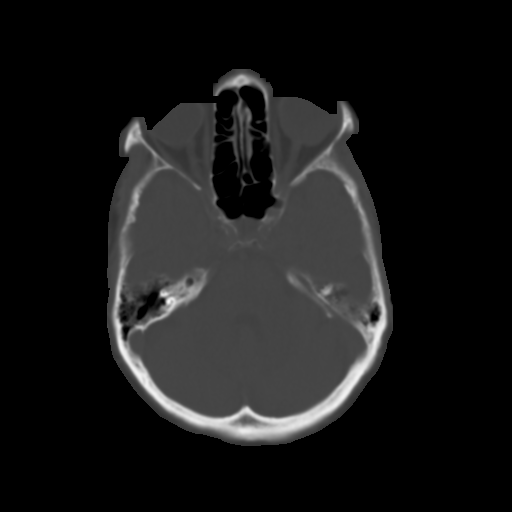
[im 11/32  brain]
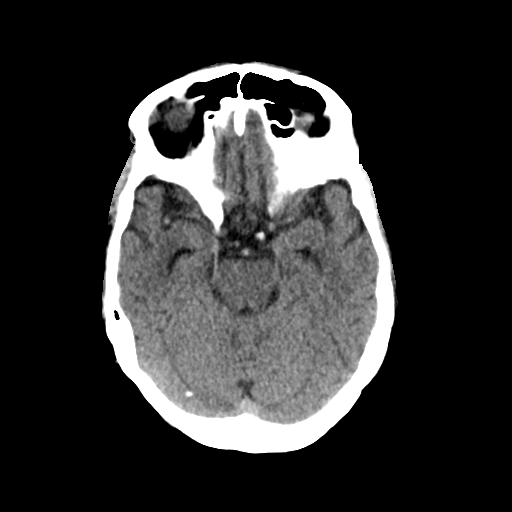
[im 13/32  brain]
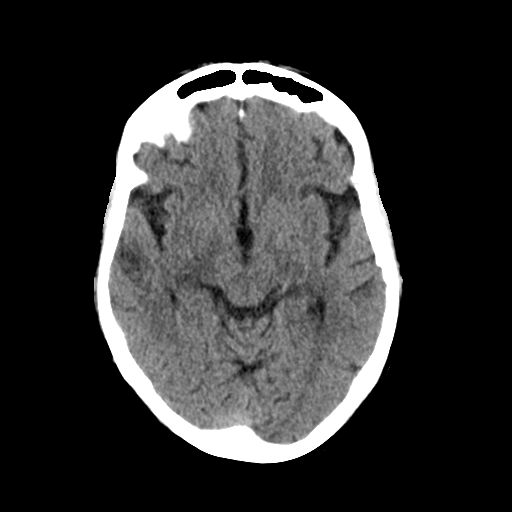
[im 15/32  brain]
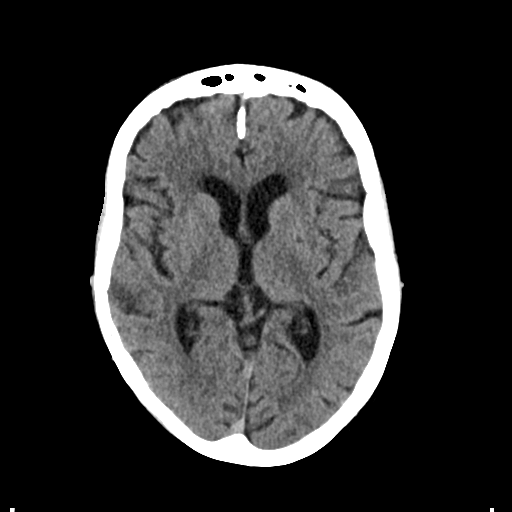
[im 17/32  brain]
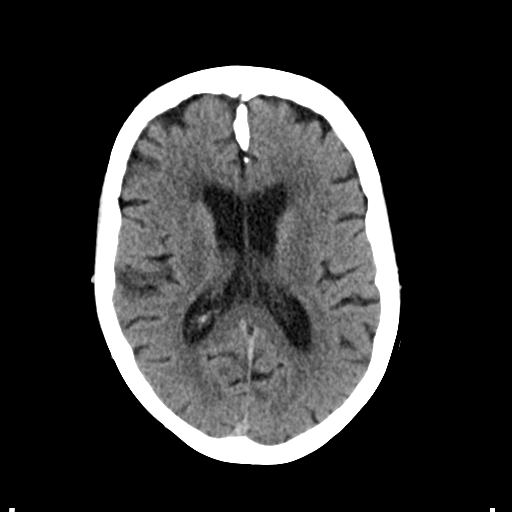
[im 17/32  bone]
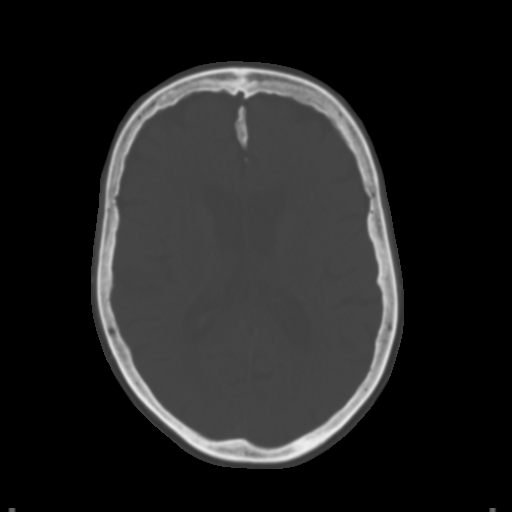
[im 19/32  brain]
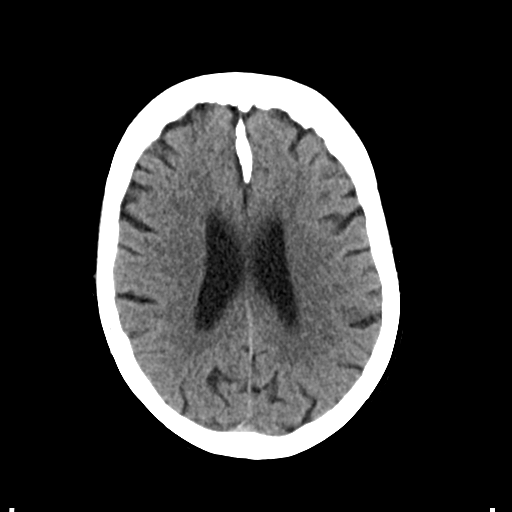
[im 21/32  brain]
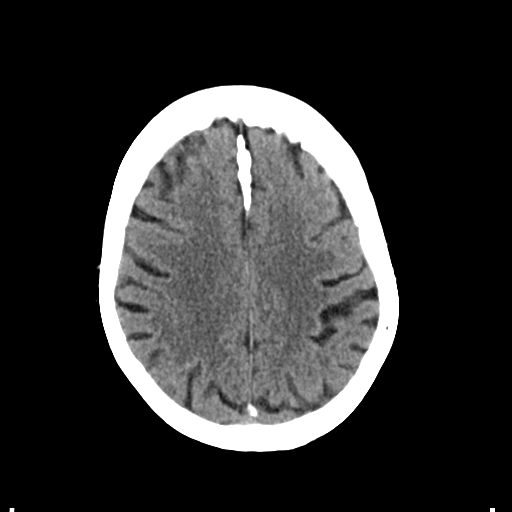
[im 23/32  brain]
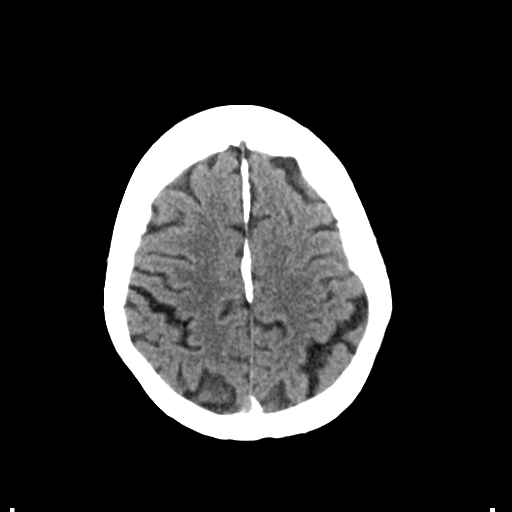
[im 24/32  brain]
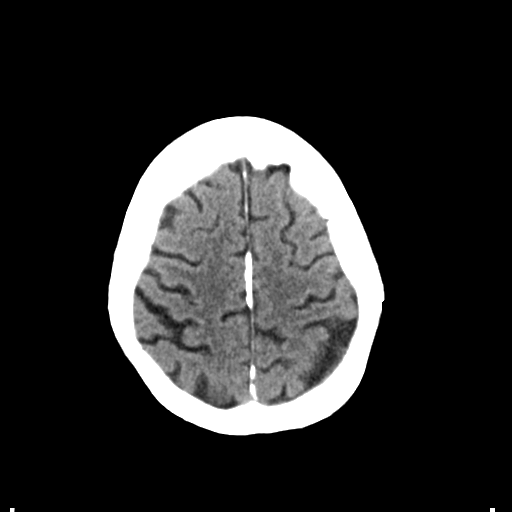
[im 24/32  bone]
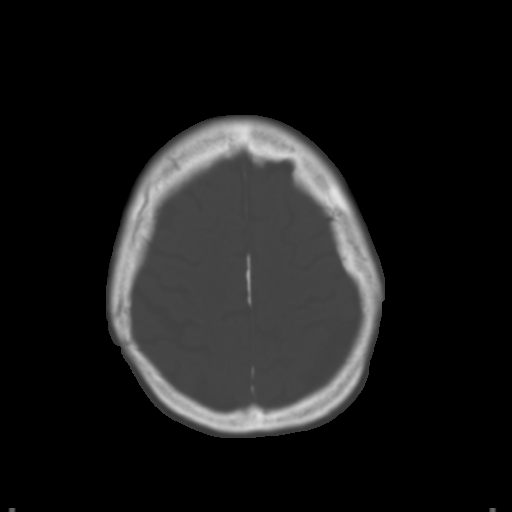
[im 26/32  brain]
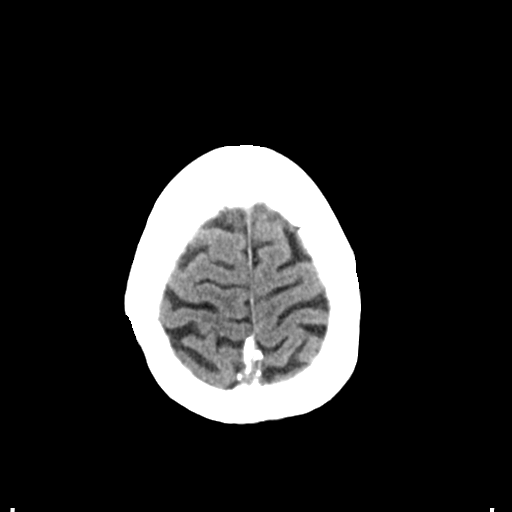
[im 28/32  brain]
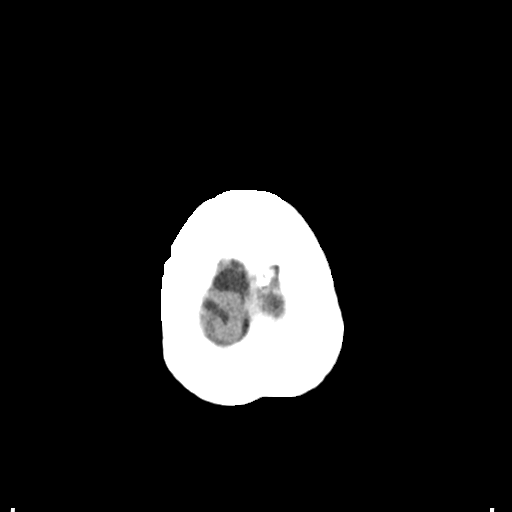
[im 30/32  brain]
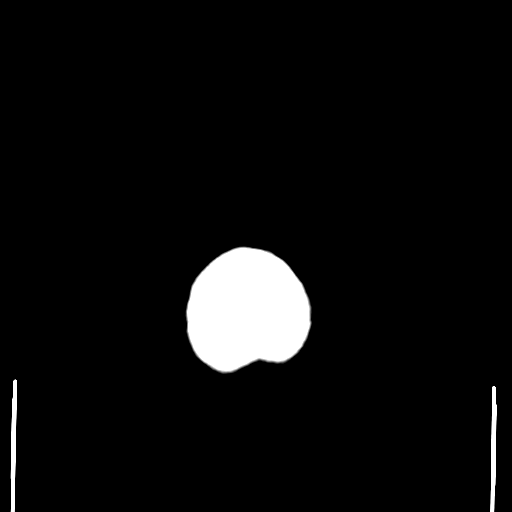

[16 of 30 positions shown; findings below may reference images not displayed]

FINDINGS: Generalized atrophy.

Normal ventricular morphology.

No midline shift or mass effect.

Small vessel chronic ischemic changes of deep cerebral white matter.

No intracranial hemorrhage, mass lesion or evidence acute
infarction.

No extra-axial fluid collections.

Scattered dural calcifications in falx.

Sinuses clear bones unremarkable.
IMPRESSION: Atrophy with small vessel chronic ischemic changes of deep cerebral
white matter.

No acute intracranial abnormalities.

## 2016-08-05 ENCOUNTER — Ambulatory Visit: Payer: Medicare Other | Admitting: Physical Medicine & Rehabilitation

## 2016-08-12 ENCOUNTER — Ambulatory Visit (INDEPENDENT_AMBULATORY_CARE_PROVIDER_SITE_OTHER): Payer: Medicare Other

## 2016-08-12 ENCOUNTER — Ambulatory Visit (INDEPENDENT_AMBULATORY_CARE_PROVIDER_SITE_OTHER): Payer: 59 | Admitting: Orthopaedic Surgery

## 2016-08-12 ENCOUNTER — Encounter (INDEPENDENT_AMBULATORY_CARE_PROVIDER_SITE_OTHER): Payer: Self-pay | Admitting: Orthopaedic Surgery

## 2016-08-12 ENCOUNTER — Ambulatory Visit (INDEPENDENT_AMBULATORY_CARE_PROVIDER_SITE_OTHER): Payer: Self-pay

## 2016-08-12 DIAGNOSIS — G8929 Other chronic pain: Secondary | ICD-10-CM | POA: Diagnosis not present

## 2016-08-12 DIAGNOSIS — M25512 Pain in left shoulder: Secondary | ICD-10-CM | POA: Diagnosis not present

## 2016-08-12 DIAGNOSIS — M25511 Pain in right shoulder: Secondary | ICD-10-CM | POA: Diagnosis not present

## 2016-08-12 NOTE — Progress Notes (Signed)
Office Visit Note   Patient: [name]           Date of Birth: 1940/12/10           MRN: [id number] Visit Date: 08/12/2016              Requested by: Wenda Low, MD [address] [place], [place] [address] PCP: Wenda Low, MD   Assessment & Plan: Visit Diagnoses:  1. Chronic pain of both shoulders     Plan: Right shoulder pain possible impingement and rotator cuff syndrome. Subacromial injection was given today.  Hopefully this will take care of it. If not better should follow-up for MRI scan  Follow-Up Instructions: Return if symptoms worsen or fail to improve.   Orders:  Orders Placed This Encounter  Procedures  . XR Shoulder Right  . XR Shoulder Left   No orders of the defined types were placed in this encounter.     Procedures: Large Joint Inj Date/Time: 08/12/2016 3:08 PM Performed by: Leandrew Koyanagi Authorized by: Leandrew Koyanagi   Consent Given by:  Patient Timeout: prior to procedure the correct patient, procedure, and site was verified   Indications:  Pain Location:  Shoulder Site:  R subacromial bursa Prep: patient was prepped and draped in usual sterile fashion   Needle Size:  22 G Approach:  Posterior Ultrasound Guidance: No   Fluoroscopic Guidance: No       Clinical Data: No additional findings.   Subjective: Chief Complaint  Patient presents with  . Left Shoulder - Pain  . Right Shoulder - Pain    Patient comes in with bilateral shoulder pain worse on the right. This is been going on for about 3-4 weeks. She does go to a pain clinic. She endorses tingling numbness and burning that is constant. She has tried Voltaren gel with no relief. She has pain with range of motion.    Review of Systems  Constitutional: Negative.   HENT: Negative.   Eyes: Negative.   Respiratory: Negative.   Cardiovascular: Negative.   Endocrine: Negative.   Musculoskeletal: Negative.   Neurological: Negative.   Hematological:  Negative.   Psychiatric/Behavioral: Negative.   All other systems reviewed and are negative.    Objective: Vital Signs: There were no vitals taken for this visit.  Physical Exam  Constitutional: She is oriented to person, place, and time. She appears well-developed and well-nourished.  Pulmonary/Chest: Effort normal.  Neurological: She is alert and oriented to person, place, and time.  Skin: Skin is warm. Capillary refill takes less than 2 seconds.  Psychiatric: She has a normal mood and affect. Her behavior is normal. Judgment and thought content normal.  Nursing note and vitals reviewed.   Ortho Exam Right shoulder exam shows painful range of motion with positive empty can and positive impingement sign. Negative Spurling test. No focal findings otherwise. Specialty Comments:  No specialty comments available.  Imaging: No results found.   PMFS History: Patient Active Problem List   Diagnosis Date Noted  . Chronic pain of both shoulders 08/12/2016  . Spinal stenosis in cervical region 06/27/2015  . Syncope 05/22/2015  . Diffuse myofascial pain syndrome 03/24/2015  . Sepsis ([place]) 10/08/2014  . Bacteremia 10/07/2014  . Leukopenia 10/07/2014  . Thrombocytopenia ([place]) 10/07/2014  . Fever 10/05/2014  . Hypoxia 10/05/2014  . HLD (hyperlipidemia) 07/30/2013  . HTN (hypertension) 07/30/2013  . GERD (gastroesophageal reflux disease) 07/30/2013  . Chest pain 07/29/2013  . Dizziness and  giddiness 06/18/2013  . Gait difficulty 05/03/2013  . Memory loss 05/03/2013   Past Medical History:  Diagnosis Date  . Anemia   . B12 deficiency   . Bladder incontinence   . Blind left eye   . Cataract   . Cervical spine fracture ([place])   . Compression fracture    C7,  upper T spine -compression fx  . COPD (chronic obstructive pulmonary disease) ([place])   . DDD (degenerative disc disease), lumbar   . Depression    major  . GERD (gastroesophageal reflux disease)   . Hiatal hernia   .  Hyperlipidemia   . Hypertension   . Macular degeneration   . Osteoarthritis   . Osteoporosis   . Reactive airway disease   . Rupture of bowel ([place])   . Stroke [facility])    "mini stroke"  . Stroke [facility])     Family History  Problem Relation Age of Onset  . Heart failure Mother   . Heart attack Father     Past Surgical History:  Procedure Laterality Date  . ABDOMINAL HYSTERECTOMY    . ABDOMINAL SURGERY    . APPENDECTOMY    . BLADDER SURGERY    . CHOLECYSTECTOMY    . COLON SURGERY    . EYE SURGERY     cataracts  . JOINT REPLACEMENT    . KNEE SURGERY    . OSTOMY    . RECTOCELE REPAIR     Social History   Occupational History  . retired     n/a   Social History Main Topics  . Smoking status: Former Smoker    Packs/day: 1.50    Years: 30.00  . Smokeless tobacco: Never Used     Comment: quit 2013  . Alcohol use No  . Drug use: No  . Sexual activity: No

## 2016-08-15 ENCOUNTER — Encounter: Payer: Self-pay | Admitting: Registered Nurse

## 2016-08-15 ENCOUNTER — Encounter: Payer: Medicare Other | Attending: Physical Medicine & Rehabilitation | Admitting: Registered Nurse

## 2016-08-15 VITALS — BP 148/96 | HR 63

## 2016-08-15 DIAGNOSIS — Z79891 Long term (current) use of opiate analgesic: Secondary | ICD-10-CM | POA: Insufficient documentation

## 2016-08-15 DIAGNOSIS — Z79899 Other long term (current) drug therapy: Secondary | ICD-10-CM

## 2016-08-15 DIAGNOSIS — H3530 Unspecified macular degeneration: Secondary | ICD-10-CM | POA: Diagnosis not present

## 2016-08-15 DIAGNOSIS — D649 Anemia, unspecified: Secondary | ICD-10-CM | POA: Diagnosis not present

## 2016-08-15 DIAGNOSIS — M810 Age-related osteoporosis without current pathological fracture: Secondary | ICD-10-CM | POA: Diagnosis not present

## 2016-08-15 DIAGNOSIS — M797 Fibromyalgia: Secondary | ICD-10-CM | POA: Insufficient documentation

## 2016-08-15 DIAGNOSIS — S63502A Unspecified sprain of left wrist, initial encounter: Secondary | ICD-10-CM | POA: Insufficient documentation

## 2016-08-15 DIAGNOSIS — K219 Gastro-esophageal reflux disease without esophagitis: Secondary | ICD-10-CM | POA: Insufficient documentation

## 2016-08-15 DIAGNOSIS — J449 Chronic obstructive pulmonary disease, unspecified: Secondary | ICD-10-CM | POA: Diagnosis not present

## 2016-08-15 DIAGNOSIS — I639 Cerebral infarction, unspecified: Secondary | ICD-10-CM | POA: Diagnosis not present

## 2016-08-15 DIAGNOSIS — I10 Essential (primary) hypertension: Secondary | ICD-10-CM | POA: Diagnosis not present

## 2016-08-15 DIAGNOSIS — S22022A Unstable burst fracture of second thoracic vertebra, initial encounter for closed fracture: Secondary | ICD-10-CM | POA: Insufficient documentation

## 2016-08-15 DIAGNOSIS — M25512 Pain in left shoulder: Secondary | ICD-10-CM | POA: Insufficient documentation

## 2016-08-15 DIAGNOSIS — G8929 Other chronic pain: Secondary | ICD-10-CM

## 2016-08-15 DIAGNOSIS — M25511 Pain in right shoulder: Secondary | ICD-10-CM | POA: Diagnosis not present

## 2016-08-15 DIAGNOSIS — M5412 Radiculopathy, cervical region: Secondary | ICD-10-CM | POA: Diagnosis not present

## 2016-08-15 DIAGNOSIS — M1990 Unspecified osteoarthritis, unspecified site: Secondary | ICD-10-CM | POA: Diagnosis not present

## 2016-08-15 DIAGNOSIS — S12600A Unspecified displaced fracture of seventh cervical vertebra, initial encounter for closed fracture: Secondary | ICD-10-CM | POA: Diagnosis not present

## 2016-08-15 DIAGNOSIS — M545 Low back pain, unspecified: Secondary | ICD-10-CM

## 2016-08-15 DIAGNOSIS — G894 Chronic pain syndrome: Secondary | ICD-10-CM | POA: Diagnosis not present

## 2016-08-15 DIAGNOSIS — F17210 Nicotine dependence, cigarettes, uncomplicated: Secondary | ICD-10-CM | POA: Insufficient documentation

## 2016-08-15 DIAGNOSIS — F329 Major depressive disorder, single episode, unspecified: Secondary | ICD-10-CM | POA: Insufficient documentation

## 2016-08-15 DIAGNOSIS — K5909 Other constipation: Secondary | ICD-10-CM | POA: Diagnosis not present

## 2016-08-15 DIAGNOSIS — M5136 Other intervertebral disc degeneration, lumbar region: Secondary | ICD-10-CM | POA: Diagnosis not present

## 2016-08-15 DIAGNOSIS — E785 Hyperlipidemia, unspecified: Secondary | ICD-10-CM | POA: Diagnosis not present

## 2016-08-15 DIAGNOSIS — M47812 Spondylosis without myelopathy or radiculopathy, cervical region: Secondary | ICD-10-CM | POA: Diagnosis not present

## 2016-08-15 DIAGNOSIS — Z5181 Encounter for therapeutic drug level monitoring: Secondary | ICD-10-CM

## 2016-08-15 MED ORDER — HYDROCODONE-ACETAMINOPHEN 7.5-325 MG PO TABS: 1.0000 | ORAL_TABLET | Freq: Three times a day (TID) | ORAL | 0 refills | Status: DC

## 2016-08-15 NOTE — Progress Notes (Signed)
Subjective:    Patient ID: [name], female    DOB: 09/03/1940, 75 y.o.   MRN: [id number]  HPI:  [name] is a 76 year old female who returns for follow up appointment and medication refill. She states her pain is located in her neck radiating into her bilateral shoulders, upper-lower-back and left knee. She rates her pain 10. Her current exercise regime is walking and performing chair exercises. Also states she had Right shoulder injection on 08/12/16 by Dr. Erlinda Hong, note reviewed.    Pain Inventory Average Pain 9 Pain Right Now 10 My pain is constant  In the last 24 hours, has pain interfered with the following? General activity 9 Relation with others 9 Enjoyment of life 9 What TIME of day is your pain at its worst? daytime Sleep (in general) Fair  Pain is worse with: walking and sitting Pain improves with: medication Relief from Meds: 4  Mobility walk without assistance ability to climb steps?  yes do you drive?  yes  Function retired  Neuro/Psych bladder control problems bowel control problems weakness numbness tremor tingling dizziness confusion depression anxiety  Prior Studies Any changes since last visit?  no  Physicians involved in your care Any changes since last visit?  no   Family History  Problem Relation Age of Onset  . Heart failure Mother   . Heart attack Father    Social History   Social History  . Marital status: Widowed    Spouse name: N/A  . Number of children: 3  . Years of education: middle sch   Occupational History  . retired     n/a   Social History Main Topics  . Smoking status: Former Smoker    Packs/day: 1.50    Years: 30.00  . Smokeless tobacco: Never Used     Comment: quit 2013  . Alcohol use No  . Drug use: No  . Sexual activity: No   Other Topics Concern  . Not on file   Social History Narrative   ** Merged History Encounter **       Patient lives at home with daughter. Caffeine Use:  4 cups daily   Past Surgical History:  Procedure Laterality Date  . ABDOMINAL HYSTERECTOMY    . ABDOMINAL SURGERY    . APPENDECTOMY    . BLADDER SURGERY    . CHOLECYSTECTOMY    . COLON SURGERY    . EYE SURGERY     cataracts  . JOINT REPLACEMENT    . KNEE SURGERY    . OSTOMY    . RECTOCELE REPAIR     Past Medical History:  Diagnosis Date  . Anemia   . B12 deficiency   . Bladder incontinence   . Blind left eye   . Cataract   . Cervical spine fracture ([place])   . Compression fracture    C7,  upper T spine -compression fx  . COPD (chronic obstructive pulmonary disease) ([place])   . DDD (degenerative disc disease), lumbar   . Depression    major  . GERD (gastroesophageal reflux disease)   . Hiatal hernia   . Hyperlipidemia   . Hypertension   . Macular degeneration   . Osteoarthritis   . Osteoporosis   . Reactive airway disease   . Rupture of bowel ([place])   . Stroke [facility])    "mini stroke"  . Stroke [facility])    There were no vitals taken for this visit.  Opioid Risk  [name]:   Fall Risk [name]:  `1  Depression screen PHQ 2/9  Depression screen PHQ 2/9 03/24/2015  Decreased Interest 2  Down, Depressed, Hopeless 3  PHQ - 2 [name] 5  Altered sleeping 3  Tired, decreased energy 3  Change in appetite 2  Feeling bad or failure about yourself  2  Trouble concentrating 1  Moving slowly or fidgety/restless 0  Suicidal thoughts 0  PHQ-9 [name] 16  Difficult doing work/chores Somewhat difficult  \ Review of Systems  Constitutional: Negative.   HENT: Negative.   Eyes: Negative.   Respiratory: Negative.   Cardiovascular: Negative.   Gastrointestinal: Positive for constipation.  Endocrine: Negative.   Genitourinary: Negative.   Musculoskeletal: Negative.   Skin: Negative.   Allergic/Immunologic: Negative.   Neurological: Negative.   Hematological: Negative.   Psychiatric/Behavioral: Negative.        Objective:   Physical Exam  Constitutional: She is oriented to  person, place, and time. She appears well-developed and well-nourished.  HENT:  Head: Normocephalic and atraumatic.  Neck: Normal range of motion. Neck supple.  Cervical Paraspinal Tenderness: C-5-C-6  Cardiovascular: Normal rate and regular rhythm.   Pulmonary/Chest: Effort normal and breath sounds normal.  Musculoskeletal:  Normal Muscle Bulk and Muscle Testing Reveals: Upper Extremities: Full ROM and Muscle Strength 5/5 Thoracic Paraspinal Tenderness: T-1-T-4 Lumbar Paraspinal Tenderness: L-3-L-5 Lower Extremities: Full ROM and Muscle Strength 5/5 Arises from Table with ease Narrow based Gait  Neurological: She is alert and oriented to person, place, and time.  Skin: Skin is warm and dry.  Psychiatric: She has a normal mood and affect.  Nursing note and vitals reviewed.         Assessment & Plan:  1. Complex multifactorial pain left side neck and Left shoulder.3 main etiologies: 08/15/2016 A. myofascial pain left trapezius, left infraspinatus. Continue to Monitor B. Cervical spinal stenosis with left C4-C5 radiculopathy. Continue Gabapentin C. Cervical spondylosis with facet arthropathy Refilled: Hydrocodone 7.5/325 mg one tablet three times a day as needed for moderate pain #90.  We will continue the opioid monitoring program, this consists of regular clinic visits, examinations, urine drug screen, pill counts as well as use of [place] Controlled Substance Reporting System. 2. Cervical Radiculopathy: Continue Gabapentin. 08/15/2016 3. Right Shoulder Pain: S/P Right Shoulder Injection : Dr. Erlinda Hong on 08/12/16 with no relief noted.  4. Chronic Low Back Pain: Continue current medication regime 5. Constipation: Continue Senna and Miralax. 08/15/2016  20 minutes of face to face patient care time was spent during this visit. All questions were encouraged and answered.  F/U in 1 month

## 2016-08-26 ENCOUNTER — Other Ambulatory Visit: Payer: Self-pay | Admitting: Orthopedic Surgery

## 2016-08-26 DIAGNOSIS — M7122 Synovial cyst of popliteal space [Baker], left knee: Secondary | ICD-10-CM

## 2016-09-04 ENCOUNTER — Ambulatory Visit
Admission: RE | Admit: 2016-09-04 | Discharge: 2016-09-04 | Disposition: A | Discharge status: Home / Self Care | Payer: Medicare Other | Source: Ambulatory Visit | Attending: Orthopedic Surgery | Admitting: Orthopedic Surgery

## 2016-09-04 DIAGNOSIS — M7122 Synovial cyst of popliteal space [Baker], left knee: Secondary | ICD-10-CM

## 2016-09-10 ENCOUNTER — Encounter: Payer: 59 | Attending: Physical Medicine & Rehabilitation | Admitting: Registered Nurse

## 2016-09-10 ENCOUNTER — Telehealth: Payer: Self-pay | Admitting: Diagnostic Neuroimaging

## 2016-09-10 DIAGNOSIS — F329 Major depressive disorder, single episode, unspecified: Secondary | ICD-10-CM | POA: Insufficient documentation

## 2016-09-10 DIAGNOSIS — S63502A Unspecified sprain of left wrist, initial encounter: Secondary | ICD-10-CM | POA: Insufficient documentation

## 2016-09-10 DIAGNOSIS — K219 Gastro-esophageal reflux disease without esophagitis: Secondary | ICD-10-CM | POA: Insufficient documentation

## 2016-09-10 DIAGNOSIS — M5136 Other intervertebral disc degeneration, lumbar region: Secondary | ICD-10-CM | POA: Insufficient documentation

## 2016-09-10 DIAGNOSIS — M1990 Unspecified osteoarthritis, unspecified site: Secondary | ICD-10-CM | POA: Insufficient documentation

## 2016-09-10 DIAGNOSIS — D649 Anemia, unspecified: Secondary | ICD-10-CM | POA: Insufficient documentation

## 2016-09-10 DIAGNOSIS — I10 Essential (primary) hypertension: Secondary | ICD-10-CM | POA: Insufficient documentation

## 2016-09-10 DIAGNOSIS — M797 Fibromyalgia: Secondary | ICD-10-CM | POA: Insufficient documentation

## 2016-09-10 DIAGNOSIS — H3530 Unspecified macular degeneration: Secondary | ICD-10-CM | POA: Insufficient documentation

## 2016-09-10 DIAGNOSIS — J449 Chronic obstructive pulmonary disease, unspecified: Secondary | ICD-10-CM | POA: Insufficient documentation

## 2016-09-10 DIAGNOSIS — I639 Cerebral infarction, unspecified: Secondary | ICD-10-CM | POA: Insufficient documentation

## 2016-09-10 DIAGNOSIS — Z79891 Long term (current) use of opiate analgesic: Secondary | ICD-10-CM | POA: Insufficient documentation

## 2016-09-10 DIAGNOSIS — S12600A Unspecified displaced fracture of seventh cervical vertebra, initial encounter for closed fracture: Secondary | ICD-10-CM | POA: Insufficient documentation

## 2016-09-10 DIAGNOSIS — F17210 Nicotine dependence, cigarettes, uncomplicated: Secondary | ICD-10-CM | POA: Insufficient documentation

## 2016-09-10 DIAGNOSIS — M810 Age-related osteoporosis without current pathological fracture: Secondary | ICD-10-CM | POA: Insufficient documentation

## 2016-09-10 DIAGNOSIS — M25512 Pain in left shoulder: Secondary | ICD-10-CM | POA: Insufficient documentation

## 2016-09-10 DIAGNOSIS — E785 Hyperlipidemia, unspecified: Secondary | ICD-10-CM | POA: Insufficient documentation

## 2016-09-10 DIAGNOSIS — S22022A Unstable burst fracture of second thoracic vertebra, initial encounter for closed fracture: Secondary | ICD-10-CM | POA: Insufficient documentation

## 2016-09-10 NOTE — Telephone Encounter (Signed)
Pt's daughter called to r/s pt's appt from 08/28/16. Pt talked with me on the phone, she said yesterday evening she was standing at the sink and had a pain rt side of the head, the top of the head radiating down the side to the ear lasting about 20 seconds. Pt said today she has been dizzy and fell backwards landing on rocks in her flower bed about 11:30. She has an appt with Dr Ronnie Derby today at 2:20.   FYI

## 2016-09-10 NOTE — Telephone Encounter (Signed)
Attempted to call # and pt answered then hung up.  Will try later.

## 2016-09-11 NOTE — Telephone Encounter (Signed)
I called # and this was not daughter's #.  Pt had fallen and hurt her neck.  Stated has had some dizziness and passing out.  I told her that we last saw her yr ago.  She is to see pcp, Dr Deforest Hoyles acutely for evaluation. She verbalized understanding.  Saw Dr, Ronnie Derby yesterday for her knee.

## 2016-09-13 ENCOUNTER — Inpatient Hospital Stay (HOSPITAL_COMMUNITY): Payer: Medicare Other | Admitting: Anesthesiology

## 2016-09-13 ENCOUNTER — Emergency Department (HOSPITAL_COMMUNITY): Payer: Medicare Other

## 2016-09-13 ENCOUNTER — Inpatient Hospital Stay (HOSPITAL_COMMUNITY)
Admission: EM | Admit: 2016-09-13 | Discharge: 2016-09-16 | DRG: 481 | Disposition: A | Discharge status: Skilled Nursing Facility | Payer: Medicare Other | Attending: Internal Medicine | Admitting: Internal Medicine

## 2016-09-13 ENCOUNTER — Encounter (HOSPITAL_COMMUNITY)
Admission: EM | Disposition: A | Discharge status: Skilled Nursing Facility | Payer: Self-pay | Source: Home / Self Care | Attending: Internal Medicine

## 2016-09-13 ENCOUNTER — Encounter (HOSPITAL_COMMUNITY): Payer: Self-pay | Admitting: Emergency Medicine

## 2016-09-13 ENCOUNTER — Inpatient Hospital Stay (HOSPITAL_COMMUNITY): Payer: Medicare Other

## 2016-09-13 DIAGNOSIS — Z886 Allergy status to analgesic agent status: Secondary | ICD-10-CM | POA: Diagnosis not present

## 2016-09-13 DIAGNOSIS — S72001A Fracture of unspecified part of neck of right femur, initial encounter for closed fracture: Secondary | ICD-10-CM

## 2016-09-13 DIAGNOSIS — M4802 Spinal stenosis, cervical region: Secondary | ICD-10-CM | POA: Diagnosis not present

## 2016-09-13 DIAGNOSIS — E876 Hypokalemia: Secondary | ICD-10-CM | POA: Diagnosis present

## 2016-09-13 DIAGNOSIS — F419 Anxiety disorder, unspecified: Secondary | ICD-10-CM | POA: Diagnosis present

## 2016-09-13 DIAGNOSIS — Z91048 Other nonmedicinal substance allergy status: Secondary | ICD-10-CM

## 2016-09-13 DIAGNOSIS — Z888 Allergy status to other drugs, medicaments and biological substances status: Secondary | ICD-10-CM | POA: Diagnosis not present

## 2016-09-13 DIAGNOSIS — Y92230 Patient room in hospital as the place of occurrence of the external cause: Secondary | ICD-10-CM | POA: Diagnosis not present

## 2016-09-13 DIAGNOSIS — H5462 Unqualified visual loss, left eye, normal vision right eye: Secondary | ICD-10-CM | POA: Diagnosis present

## 2016-09-13 DIAGNOSIS — K219 Gastro-esophageal reflux disease without esophagitis: Secondary | ICD-10-CM | POA: Diagnosis not present

## 2016-09-13 DIAGNOSIS — Z79899 Other long term (current) drug therapy: Secondary | ICD-10-CM

## 2016-09-13 DIAGNOSIS — Z90710 Acquired absence of both cervix and uterus: Secondary | ICD-10-CM

## 2016-09-13 DIAGNOSIS — M5136 Other intervertebral disc degeneration, lumbar region: Secondary | ICD-10-CM | POA: Diagnosis present

## 2016-09-13 DIAGNOSIS — Z880 Allergy status to penicillin: Secondary | ICD-10-CM

## 2016-09-13 DIAGNOSIS — Z96652 Presence of left artificial knee joint: Secondary | ICD-10-CM | POA: Diagnosis present

## 2016-09-13 DIAGNOSIS — H3530 Unspecified macular degeneration: Secondary | ICD-10-CM | POA: Diagnosis present

## 2016-09-13 DIAGNOSIS — N329 Bladder disorder, unspecified: Secondary | ICD-10-CM | POA: Diagnosis not present

## 2016-09-13 DIAGNOSIS — S22029A Unspecified fracture of second thoracic vertebra, initial encounter for closed fracture: Secondary | ICD-10-CM | POA: Diagnosis not present

## 2016-09-13 DIAGNOSIS — E7800 Pure hypercholesterolemia, unspecified: Secondary | ICD-10-CM | POA: Diagnosis present

## 2016-09-13 DIAGNOSIS — S72141A Displaced intertrochanteric fracture of right femur, initial encounter for closed fracture: Secondary | ICD-10-CM | POA: Diagnosis not present

## 2016-09-13 DIAGNOSIS — S72141S Displaced intertrochanteric fracture of right femur, sequela: Secondary | ICD-10-CM

## 2016-09-13 DIAGNOSIS — F329 Major depressive disorder, single episode, unspecified: Secondary | ICD-10-CM | POA: Diagnosis present

## 2016-09-13 DIAGNOSIS — Z87891 Personal history of nicotine dependence: Secondary | ICD-10-CM | POA: Diagnosis not present

## 2016-09-13 DIAGNOSIS — F339 Major depressive disorder, recurrent, unspecified: Secondary | ICD-10-CM | POA: Diagnosis not present

## 2016-09-13 DIAGNOSIS — Z8673 Personal history of transient ischemic attack (TIA), and cerebral infarction without residual deficits: Secondary | ICD-10-CM | POA: Diagnosis not present

## 2016-09-13 DIAGNOSIS — J449 Chronic obstructive pulmonary disease, unspecified: Secondary | ICD-10-CM | POA: Diagnosis present

## 2016-09-13 DIAGNOSIS — R339 Retention of urine, unspecified: Secondary | ICD-10-CM | POA: Diagnosis not present

## 2016-09-13 DIAGNOSIS — Z79891 Long term (current) use of opiate analgesic: Secondary | ICD-10-CM

## 2016-09-13 DIAGNOSIS — E785 Hyperlipidemia, unspecified: Secondary | ICD-10-CM | POA: Diagnosis not present

## 2016-09-13 DIAGNOSIS — S72009A Fracture of unspecified part of neck of unspecified femur, initial encounter for closed fracture: Secondary | ICD-10-CM

## 2016-09-13 DIAGNOSIS — Z9849 Cataract extraction status, unspecified eye: Secondary | ICD-10-CM | POA: Diagnosis not present

## 2016-09-13 DIAGNOSIS — T40605A Adverse effect of unspecified narcotics, initial encounter: Secondary | ICD-10-CM | POA: Diagnosis not present

## 2016-09-13 DIAGNOSIS — Z881 Allergy status to other antibiotic agents status: Secondary | ICD-10-CM | POA: Diagnosis not present

## 2016-09-13 DIAGNOSIS — Z885 Allergy status to narcotic agent status: Secondary | ICD-10-CM

## 2016-09-13 DIAGNOSIS — F411 Generalized anxiety disorder: Secondary | ICD-10-CM | POA: Diagnosis not present

## 2016-09-13 DIAGNOSIS — Y92019 Unspecified place in single-family (private) house as the place of occurrence of the external cause: Secondary | ICD-10-CM

## 2016-09-13 DIAGNOSIS — Z419 Encounter for procedure for purposes other than remedying health state, unspecified: Secondary | ICD-10-CM

## 2016-09-13 DIAGNOSIS — Z882 Allergy status to sulfonamides status: Secondary | ICD-10-CM

## 2016-09-13 DIAGNOSIS — R330 Drug induced retention of urine: Secondary | ICD-10-CM | POA: Diagnosis not present

## 2016-09-13 DIAGNOSIS — W19XXXA Unspecified fall, initial encounter: Secondary | ICD-10-CM | POA: Diagnosis not present

## 2016-09-13 DIAGNOSIS — S12600A Unspecified displaced fracture of seventh cervical vertebra, initial encounter for closed fracture: Secondary | ICD-10-CM | POA: Diagnosis present

## 2016-09-13 DIAGNOSIS — N179 Acute kidney failure, unspecified: Secondary | ICD-10-CM | POA: Diagnosis not present

## 2016-09-13 DIAGNOSIS — Z8249 Family history of ischemic heart disease and other diseases of the circulatory system: Secondary | ICD-10-CM

## 2016-09-13 DIAGNOSIS — E8889 Other specified metabolic disorders: Secondary | ICD-10-CM | POA: Diagnosis not present

## 2016-09-13 DIAGNOSIS — Z741 Need for assistance with personal care: Secondary | ICD-10-CM | POA: Diagnosis not present

## 2016-09-13 DIAGNOSIS — W1830XA Fall on same level, unspecified, initial encounter: Secondary | ICD-10-CM | POA: Diagnosis present

## 2016-09-13 DIAGNOSIS — W19XXXS Unspecified fall, sequela: Secondary | ICD-10-CM | POA: Diagnosis not present

## 2016-09-13 DIAGNOSIS — T148XXA Other injury of unspecified body region, initial encounter: Secondary | ICD-10-CM

## 2016-09-13 DIAGNOSIS — D500 Iron deficiency anemia secondary to blood loss (chronic): Secondary | ICD-10-CM | POA: Diagnosis not present

## 2016-09-13 DIAGNOSIS — I10 Essential (primary) hypertension: Secondary | ICD-10-CM | POA: Diagnosis present

## 2016-09-13 DIAGNOSIS — S0990XA Unspecified injury of head, initial encounter: Secondary | ICD-10-CM

## 2016-09-13 DIAGNOSIS — Z791 Long term (current) use of non-steroidal anti-inflammatories (NSAID): Secondary | ICD-10-CM

## 2016-09-13 DIAGNOSIS — R262 Difficulty in walking, not elsewhere classified: Secondary | ICD-10-CM | POA: Diagnosis not present

## 2016-09-13 DIAGNOSIS — S0990XS Unspecified injury of head, sequela: Secondary | ICD-10-CM | POA: Diagnosis not present

## 2016-09-13 DIAGNOSIS — M810 Age-related osteoporosis without current pathological fracture: Secondary | ICD-10-CM | POA: Diagnosis present

## 2016-09-13 HISTORY — PX: INTRAMEDULLARY (IM) NAIL INTERTROCHANTERIC: SHX5875

## 2016-09-13 LAB — TYPE AND SCREEN
ABO/RH(D): AB POS
Antibody Screen: NEGATIVE

## 2016-09-13 LAB — PROTIME-INR
INR: 1
Prothrombin Time: 13.2 seconds (ref 11.4–15.2)

## 2016-09-13 LAB — URINALYSIS, ROUTINE W REFLEX MICROSCOPIC
Bilirubin Urine: NEGATIVE
Glucose, UA: NEGATIVE mg/dL
Hgb urine dipstick: NEGATIVE
Ketones, ur: NEGATIVE mg/dL
Leukocytes, UA: NEGATIVE
Nitrite: NEGATIVE
Protein, ur: NEGATIVE mg/dL
Specific Gravity, Urine: 1.003 — ABNORMAL LOW (ref 1.005–1.030)
pH: 7 (ref 5.0–8.0)

## 2016-09-13 LAB — BASIC METABOLIC PANEL
Anion gap: 13 (ref 5–15)
BUN: 7 mg/dL (ref 6–20)
CO2: 26 mmol/L (ref 22–32)
Calcium: 9.5 mg/dL (ref 8.9–10.3)
Chloride: 104 mmol/L (ref 101–111)
Creatinine, Ser: 1.02 mg/dL — ABNORMAL HIGH (ref 0.44–1.00)
GFR calc Af Amer: >60 mL/min (ref >60)
GFR calc non Af Amer: 52 mL/min — ABNORMAL LOW (ref >60)
Glucose, Bld: 113 mg/dL — ABNORMAL HIGH (ref 65–99)
Potassium: 3.3 mmol/L — ABNORMAL LOW (ref 3.5–5.1)
Sodium: 143 mmol/L (ref 135–145)

## 2016-09-13 LAB — CBC WITH DIFFERENTIAL/PLATELET
Basophils Absolute: 0 10*3/uL (ref 0.0–0.1)
Basophils Relative: 0 %
Eosinophils Absolute: 0.3 10*3/uL (ref 0.0–0.7)
Eosinophils Relative: 5 %
HCT: 43.7 % (ref 36.0–46.0)
Hemoglobin: 14.1 g/dL (ref 12.0–15.0)
Lymphocytes Relative: 32 %
Lymphs Abs: 2 10*3/uL (ref 0.7–4.0)
MCH: 29.6 pg (ref 26.0–34.0)
MCHC: 32.3 g/dL (ref 30.0–36.0)
MCV: 91.6 fL (ref 78.0–100.0)
Monocytes Absolute: 0.5 10*3/uL (ref 0.1–1.0)
Monocytes Relative: 9 %
Neutro Abs: 3.3 10*3/uL (ref 1.7–7.7)
Neutrophils Relative %: 54 %
Platelets: 175 10*3/uL (ref 150–400)
RBC: 4.77 MIL/uL (ref 3.87–5.11)
RDW: 14.1 % (ref 11.5–15.5)
WBC: 6.1 10*3/uL (ref 4.0–10.5)

## 2016-09-13 LAB — SURGICAL PCR SCREEN
MRSA, PCR: NEGATIVE
Staphylococcus aureus: NEGATIVE

## 2016-09-13 SURGERY — FIXATION, FRACTURE, INTERTROCHANTERIC, WITH INTRAMEDULLARY ROD
Anesthesia: General | Laterality: Right

## 2016-09-13 MED ORDER — LACTATED RINGERS IV SOLN
INTRAVENOUS | Status: DC
  Administered 2016-09-13: 15:00:00 via INTRAVENOUS

## 2016-09-13 MED ORDER — PROPOFOL 10 MG/ML IV BOLUS
INTRAVENOUS | Status: AC
  Filled 2016-09-13: qty 20

## 2016-09-13 MED ORDER — SUGAMMADEX SODIUM 200 MG/2ML IV SOLN
INTRAVENOUS | Status: DC | PRN
Start: 2016-09-13 — End: 2016-09-13
  Administered 2016-09-13: 163.2 mg via INTRAVENOUS

## 2016-09-13 MED ORDER — AMLODIPINE BESYLATE 5 MG PO TABS
5.0000 mg | ORAL_TABLET | Freq: Every day | ORAL | Status: DC
  Administered 2016-09-14 – 2016-09-16 (×3): 5 mg via ORAL
  Filled 2016-09-13 (×4): qty 1

## 2016-09-13 MED ORDER — ENOXAPARIN SODIUM 40 MG/0.4ML ~~LOC~~ SOLN: 40.0000 mg | Freq: Every day | SUBCUTANEOUS | 0 refills | Status: DC

## 2016-09-13 MED ORDER — GABAPENTIN 400 MG PO CAPS
400.0000 mg | ORAL_CAPSULE | Freq: Four times a day (QID) | ORAL | Status: DC
  Administered 2016-09-13 – 2016-09-16 (×12): 400 mg via ORAL
  Filled 2016-09-13 (×13): qty 1

## 2016-09-13 MED ORDER — ALBUTEROL SULFATE (2.5 MG/3ML) 0.083% IN NEBU
2.5000 mg | INHALATION_SOLUTION | Freq: Four times a day (QID) | RESPIRATORY_TRACT | Status: DC | PRN
  Administered 2016-09-16: 2.5 mg via RESPIRATORY_TRACT
  Filled 2016-09-13: qty 3

## 2016-09-13 MED ORDER — CEFAZOLIN SODIUM-DEXTROSE 2-4 GM/100ML-% IV SOLN
2.0000 g | INTRAVENOUS | Status: AC
  Administered 2016-09-13: 2 g via INTRAVENOUS
  Filled 2016-09-13: qty 100

## 2016-09-13 MED ORDER — PHENYLEPHRINE HCL 10 MG/ML IJ SOLN
INTRAVENOUS | Status: DC | PRN
  Administered 2016-09-13: 25 ug/min via INTRAVENOUS

## 2016-09-13 MED ORDER — FENTANYL CITRATE (PF) 100 MCG/2ML IJ SOLN
50.0000 ug | INTRAMUSCULAR | Status: DC | PRN
  Administered 2016-09-13: 50 ug via INTRAVENOUS
  Filled 2016-09-13: qty 2

## 2016-09-13 MED ORDER — EPHEDRINE SULFATE 50 MG/ML IJ SOLN
INTRAMUSCULAR | Status: DC | PRN
  Administered 2016-09-13 (×2): 10 mg via INTRAVENOUS

## 2016-09-13 MED ORDER — CITALOPRAM HYDROBROMIDE 20 MG PO TABS
30.0000 mg | ORAL_TABLET | Freq: Every day | ORAL | Status: DC
  Administered 2016-09-14 – 2016-09-16 (×3): 30 mg via ORAL
  Filled 2016-09-13 (×4): qty 1

## 2016-09-13 MED ORDER — ACETAMINOPHEN 325 MG PO TABS: 650.0000 mg | ORAL_TABLET | Freq: Four times a day (QID) | ORAL | Status: DC | PRN

## 2016-09-13 MED ORDER — FENTANYL CITRATE (PF) 100 MCG/2ML IJ SOLN
25.0000 ug | INTRAMUSCULAR | Status: DC | PRN
  Administered 2016-09-13 (×2): 25 ug via INTRAVENOUS

## 2016-09-13 MED ORDER — HYDROMORPHONE HCL 1 MG/ML IJ SOLN
0.5000 mg | Freq: Once | INTRAMUSCULAR | Status: AC
  Administered 2016-09-13: 0.5 mg via INTRAVENOUS
  Filled 2016-09-13: qty 1

## 2016-09-13 MED ORDER — METOCLOPRAMIDE HCL 5 MG/ML IJ SOLN: 5.0000 mg | Freq: Three times a day (TID) | INTRAMUSCULAR | Status: DC | PRN

## 2016-09-13 MED ORDER — PROPOFOL 10 MG/ML IV BOLUS
INTRAVENOUS | Status: AC
  Filled 2016-09-13: qty 40

## 2016-09-13 MED ORDER — SUGAMMADEX SODIUM 200 MG/2ML IV SOLN
INTRAVENOUS | Status: AC
  Filled 2016-09-13: qty 2

## 2016-09-13 MED ORDER — POLYETHYLENE GLYCOL 3350 17 G PO PACK
17.0000 g | PACK | Freq: Every day | ORAL | Status: DC
  Administered 2016-09-14 – 2016-09-16 (×3): 17 g via ORAL
  Filled 2016-09-13 (×4): qty 1

## 2016-09-13 MED ORDER — FENTANYL CITRATE (PF) 100 MCG/2ML IJ SOLN
INTRAMUSCULAR | Status: DC | PRN
  Administered 2016-09-13 (×3): 50 ug via INTRAVENOUS

## 2016-09-13 MED ORDER — MORPHINE SULFATE (PF) 2 MG/ML IV SOLN: 0.5000 mg | INTRAVENOUS | Status: DC | PRN

## 2016-09-13 MED ORDER — SODIUM CHLORIDE 0.9 % IV SOLN: INTRAVENOUS | Status: DC

## 2016-09-13 MED ORDER — CHLORHEXIDINE GLUCONATE 4 % EX LIQD
60.0000 mL | Freq: Once | CUTANEOUS | Status: AC
  Administered 2016-09-13: 4 via TOPICAL

## 2016-09-13 MED ORDER — ALBUTEROL SULFATE HFA 108 (90 BASE) MCG/ACT IN AERS: 1.0000 | INHALATION_SPRAY | Freq: Four times a day (QID) | RESPIRATORY_TRACT | Status: DC | PRN

## 2016-09-13 MED ORDER — CLINDAMYCIN PHOSPHATE 900 MG/50ML IV SOLN
900.0000 mg | INTRAVENOUS | Status: DC
  Filled 2016-09-13: qty 50

## 2016-09-13 MED ORDER — HEPARIN SODIUM (PORCINE) 5000 UNIT/ML IJ SOLN
5000.0000 [IU] | Freq: Three times a day (TID) | INTRAMUSCULAR | Status: DC
  Administered 2016-09-13: 5000 [IU] via SUBCUTANEOUS
  Filled 2016-09-13 (×2): qty 1

## 2016-09-13 MED ORDER — PANTOPRAZOLE SODIUM 40 MG PO TBEC
80.0000 mg | DELAYED_RELEASE_TABLET | Freq: Every day | ORAL | Status: DC
  Administered 2016-09-14 – 2016-09-16 (×3): 80 mg via ORAL
  Filled 2016-09-13 (×3): qty 2

## 2016-09-13 MED ORDER — ONDANSETRON HCL 4 MG/2ML IJ SOLN
4.0000 mg | Freq: Four times a day (QID) | INTRAMUSCULAR | Status: DC | PRN
  Administered 2016-09-15: 4 mg via INTRAVENOUS
  Filled 2016-09-13: qty 2

## 2016-09-13 MED ORDER — FENTANYL CITRATE (PF) 100 MCG/2ML IJ SOLN
INTRAMUSCULAR | Status: AC
  Filled 2016-09-13: qty 2

## 2016-09-13 MED ORDER — METHOCARBAMOL 500 MG PO TABS
250.0000 mg | ORAL_TABLET | Freq: Two times a day (BID) | ORAL | Status: DC
  Administered 2016-09-13 – 2016-09-14 (×2): 250 mg via ORAL
  Filled 2016-09-13 (×3): qty 1

## 2016-09-13 MED ORDER — ONDANSETRON HCL 4 MG/2ML IJ SOLN
4.0000 mg | Freq: Once | INTRAMUSCULAR | Status: AC
  Administered 2016-09-13: 4 mg via INTRAVENOUS
  Filled 2016-09-13: qty 2

## 2016-09-13 MED ORDER — POVIDONE-IODINE 10 % EX SWAB: 2.0000 "application " | Freq: Once | CUTANEOUS | Status: DC

## 2016-09-13 MED ORDER — BISACODYL 5 MG PO TBEC
10.0000 mg | DELAYED_RELEASE_TABLET | Freq: Every day | ORAL | Status: DC
  Administered 2016-09-14 – 2016-09-16 (×3): 10 mg via ORAL
  Filled 2016-09-13 (×4): qty 2

## 2016-09-13 MED ORDER — ROCURONIUM BROMIDE 100 MG/10ML IV SOLN
INTRAVENOUS | Status: DC | PRN
  Administered 2016-09-13: 40 mg via INTRAVENOUS

## 2016-09-13 MED ORDER — PROMETHAZINE HCL 25 MG PO TABS: 12.5000 mg | ORAL_TABLET | Freq: Four times a day (QID) | ORAL | Status: DC | PRN

## 2016-09-13 MED ORDER — CEFAZOLIN SODIUM-DEXTROSE 2-4 GM/100ML-% IV SOLN
2.0000 g | Freq: Four times a day (QID) | INTRAVENOUS | Status: AC
  Administered 2016-09-13 – 2016-09-14 (×3): 2 g via INTRAVENOUS
  Filled 2016-09-13 (×3): qty 100

## 2016-09-13 MED ORDER — NALOXEGOL OXALATE 25 MG PO TABS
25.0000 mg | ORAL_TABLET | Freq: Every day | ORAL | Status: DC
  Administered 2016-09-14 – 2016-09-16 (×3): 25 mg via ORAL
  Filled 2016-09-13 (×4): qty 1

## 2016-09-13 MED ORDER — BUSPIRONE HCL 5 MG PO TABS
5.0000 mg | ORAL_TABLET | Freq: Two times a day (BID) | ORAL | Status: DC
  Administered 2016-09-13 – 2016-09-16 (×6): 5 mg via ORAL
  Filled 2016-09-13 (×7): qty 1

## 2016-09-13 MED ORDER — ACETAMINOPHEN 650 MG RE SUPP: 650.0000 mg | Freq: Four times a day (QID) | RECTAL | Status: DC | PRN

## 2016-09-13 MED ORDER — HYDROMORPHONE HCL 2 MG PO TABS: 2.0000 mg | ORAL_TABLET | ORAL | 0 refills | Status: DC | PRN

## 2016-09-13 MED ORDER — HYDROMORPHONE HCL 2 MG PO TABS
2.0000 mg | ORAL_TABLET | ORAL | Status: DC | PRN
  Administered 2016-09-14: 2 mg via ORAL
  Filled 2016-09-13 (×2): qty 1

## 2016-09-13 MED ORDER — PRAVASTATIN SODIUM 20 MG PO TABS
10.0000 mg | ORAL_TABLET | Freq: Every day | ORAL | Status: DC
  Administered 2016-09-13 – 2016-09-15 (×3): 10 mg via ORAL
  Filled 2016-09-13 (×3): qty 1

## 2016-09-13 MED ORDER — MIDAZOLAM HCL 2 MG/2ML IJ SOLN
INTRAMUSCULAR | Status: AC
  Filled 2016-09-13: qty 2

## 2016-09-13 MED ORDER — VITAMIN B-12 1000 MCG PO TABS
2000.0000 ug | ORAL_TABLET | Freq: Every day | ORAL | Status: DC
  Administered 2016-09-14 – 2016-09-16 (×3): 2000 ug via ORAL
  Filled 2016-09-13 (×4): qty 2

## 2016-09-13 MED ORDER — TRAZODONE HCL 50 MG PO TABS
25.0000 mg | ORAL_TABLET | Freq: Every day | ORAL | Status: DC
  Administered 2016-09-13 – 2016-09-15 (×3): 25 mg via ORAL
  Filled 2016-09-13 (×3): qty 1

## 2016-09-13 MED ORDER — PROPOFOL 10 MG/ML IV BOLUS
INTRAVENOUS | Status: DC | PRN
  Administered 2016-09-13: 100 mg via INTRAVENOUS

## 2016-09-13 MED ORDER — PHENYLEPHRINE HCL 10 MG/ML IJ SOLN
INTRAMUSCULAR | Status: AC
  Filled 2016-09-13: qty 3

## 2016-09-13 MED ORDER — MENTHOL 3 MG MT LOZG: 1.0000 | LOZENGE | OROMUCOSAL | Status: DC | PRN

## 2016-09-13 MED ORDER — LIDOCAINE HCL (CARDIAC) 20 MG/ML IV SOLN
INTRAVENOUS | Status: DC | PRN
  Administered 2016-09-13: 60 mg via INTRATRACHEAL

## 2016-09-13 MED ORDER — METHOCARBAMOL 500 MG PO TABS: 500.0000 mg | ORAL_TABLET | Freq: Four times a day (QID) | ORAL | Status: DC | PRN

## 2016-09-13 MED ORDER — ENOXAPARIN SODIUM 40 MG/0.4ML ~~LOC~~ SOLN
40.0000 mg | SUBCUTANEOUS | Status: DC
  Administered 2016-09-14 – 2016-09-16 (×3): 40 mg via SUBCUTANEOUS
  Filled 2016-09-13 (×3): qty 0.4

## 2016-09-13 MED ORDER — ONDANSETRON HCL 4 MG/2ML IJ SOLN: 4.0000 mg | Freq: Once | INTRAMUSCULAR | Status: DC | PRN

## 2016-09-13 MED ORDER — EPHEDRINE 5 MG/ML INJ
INTRAVENOUS | Status: AC
  Filled 2016-09-13: qty 10

## 2016-09-13 MED ORDER — LACTATED RINGERS IV SOLN
INTRAVENOUS | Status: DC | PRN
  Administered 2016-09-13: 14:00:00 via INTRAVENOUS

## 2016-09-13 MED ORDER — ONDANSETRON HCL 4 MG PO TABS: 4.0000 mg | ORAL_TABLET | Freq: Four times a day (QID) | ORAL | Status: DC | PRN

## 2016-09-13 MED ORDER — ALUM & MAG HYDROXIDE-SIMETH 200-200-20 MG/5ML PO SUSP: 30.0000 mL | ORAL | Status: DC | PRN

## 2016-09-13 MED ORDER — HYDROMORPHONE HCL 1 MG/ML IJ SOLN
1.0000 mg | INTRAMUSCULAR | Status: DC | PRN
  Administered 2016-09-13 (×4): 1 mg via INTRAVENOUS
  Filled 2016-09-13 (×4): qty 1

## 2016-09-13 MED ORDER — METHOCARBAMOL 1000 MG/10ML IJ SOLN
500.0000 mg | Freq: Four times a day (QID) | INTRAVENOUS | Status: DC | PRN
  Filled 2016-09-13: qty 5

## 2016-09-13 MED ORDER — PHENOL 1.4 % MT LIQD: 1.0000 | OROMUCOSAL | Status: DC | PRN

## 2016-09-13 MED ORDER — DIPHENHYDRAMINE HCL 50 MG/ML IJ SOLN
12.5000 mg | Freq: Four times a day (QID) | INTRAMUSCULAR | Status: DC | PRN
  Administered 2016-09-13 (×2): 12.5 mg via INTRAVENOUS
  Filled 2016-09-13 (×2): qty 1

## 2016-09-13 MED ORDER — FENTANYL CITRATE (PF) 250 MCG/5ML IJ SOLN
INTRAMUSCULAR | Status: AC
  Filled 2016-09-13: qty 5

## 2016-09-13 MED ORDER — METOCLOPRAMIDE HCL 5 MG PO TABS: 5.0000 mg | ORAL_TABLET | Freq: Three times a day (TID) | ORAL | Status: DC | PRN

## 2016-09-13 MED ORDER — ONDANSETRON HCL 4 MG/2ML IJ SOLN
INTRAMUSCULAR | Status: AC
  Filled 2016-09-13: qty 2

## 2016-09-13 MED ORDER — ONDANSETRON HCL 4 MG/2ML IJ SOLN
INTRAMUSCULAR | Status: DC | PRN
  Administered 2016-09-13: 4 mg via INTRAVENOUS

## 2016-09-13 MED ORDER — FERROUS SULFATE 325 (65 FE) MG PO TABS
325.0000 mg | ORAL_TABLET | ORAL | Status: DC
  Administered 2016-09-16: 325 mg via ORAL
  Filled 2016-09-13: qty 1

## 2016-09-13 MED ORDER — ALPRAZOLAM 0.5 MG PO TABS: 1.0000 mg | ORAL_TABLET | Freq: Every evening | ORAL | Status: DC | PRN

## 2016-09-13 SURGICAL SUPPLY — 39 items
BIT DRILL LONG 4.0 (BIT) ×1 IMPLANT
BNDG COHESIVE 4X5 TAN NS LF (GAUZE/BANDAGES/DRESSINGS) ×3 IMPLANT
BNDG COHESIVE 6X5 TAN STRL LF (GAUZE/BANDAGES/DRESSINGS) IMPLANT
BNDG GAUZE ELAST 4 BULKY (GAUZE/BANDAGES/DRESSINGS) ×3 IMPLANT
COVER PERINEAL POST (MISCELLANEOUS) ×3 IMPLANT
COVER SURGICAL LIGHT HANDLE (MISCELLANEOUS) ×3 IMPLANT
DRAPE STERI IOBAN 125X83 (DRAPES) ×3 IMPLANT
DRILL BIT LONG 4.0 (BIT) ×3
DRSG MEPILEX BORDER 4X4 (GAUZE/BANDAGES/DRESSINGS) ×6 IMPLANT
DRSG MEPILEX BORDER 4X8 (GAUZE/BANDAGES/DRESSINGS) IMPLANT
DRSG PAD ABDOMINAL 8X10 ST (GAUZE/BANDAGES/DRESSINGS) ×6 IMPLANT
DURAPREP 26ML APPLICATOR (WOUND CARE) ×3 IMPLANT
ELECT REM PT RETURN 9FT ADLT (ELECTROSURGICAL) ×3
ELECTRODE REM PT RTRN 9FT ADLT (ELECTROSURGICAL) ×1 IMPLANT
GLOVE SKINSENSE NS SZ7.5 (GLOVE) ×4
GLOVE SKINSENSE STRL SZ7.5 (GLOVE) ×2 IMPLANT
GOWN STRL REIN XL XLG (GOWN DISPOSABLE) ×3 IMPLANT
GUIDE PIN 3.2X343 (PIN) ×1
GUIDE PIN 3.2X343MM (PIN) ×2
KIT BASIN OR (CUSTOM PROCEDURE TRAY) ×3 IMPLANT
KIT ROOM TURNOVER OR (KITS) ×3 IMPLANT
MANIFOLD NEPTUNE II (INSTRUMENTS) ×3 IMPLANT
NAIL TRIGEN INTERTAN 10X18CM (Nail) ×3 IMPLANT
NS IRRIG 1000ML POUR BTL (IV SOLUTION) ×3 IMPLANT
PACK GENERAL/GYN (CUSTOM PROCEDURE TRAY) ×3 IMPLANT
PAD ARMBOARD 7.5X6 YLW CONV (MISCELLANEOUS) ×6 IMPLANT
PAD CAST 4YDX4 CTTN HI CHSV (CAST SUPPLIES) ×2 IMPLANT
PADDING CAST COTTON 4X4 STRL (CAST SUPPLIES) ×4
PIN GUIDE 3.2X343MM (PIN) ×1 IMPLANT
SCREW LAG COMPR KIT 95/90 (Screw) ×3 IMPLANT
SCREW TRIGEN LOW PROF 5.0X35 (Screw) ×3 IMPLANT
STAPLER VISISTAT 35W (STAPLE) ×3 IMPLANT
SUT VIC AB 0 CT1 27 (SUTURE) ×2
SUT VIC AB 0 CT1 27XBRD ANBCTR (SUTURE) ×1 IMPLANT
SUT VIC AB 2-0 CT1 27 (SUTURE) ×2
SUT VIC AB 2-0 CT1 TAPERPNT 27 (SUTURE) ×1 IMPLANT
TOWEL OR 17X24 6PK STRL BLUE (TOWEL DISPOSABLE) ×3 IMPLANT
TOWEL OR 17X26 10 PK STRL BLUE (TOWEL DISPOSABLE) ×3 IMPLANT
WATER STERILE IRR 1000ML POUR (IV SOLUTION) ×6 IMPLANT

## 2016-09-13 NOTE — Anesthesia Preprocedure Evaluation (Addendum)
Anesthesia Evaluation  Patient identified by MRN, date of birth, ID band Patient awake    Reviewed: Allergy & Precautions, NPO status , Patient's Chart, lab work & pertinent test results  Airway Mallampati: II  TM Distance: >3 FB Neck ROM: Full    Dental  (+) Dental Advisory Given, Edentulous Lower, Edentulous Upper   Pulmonary COPD, former smoker,    Pulmonary exam normal breath sounds clear to auscultation       Cardiovascular hypertension, Pt. on medications Normal cardiovascular exam Rhythm:Regular Rate:Normal  Echo 12/16: Study Conclusions  - Left ventricle: The cavity size was normal. Wall thickness was   normal. Systolic function was normal. The estimated ejection   fraction was in the range of 55% to 60%. Wall motion was normal;   there were no regional wall motion abnormalities. Doppler   parameters are consistent with abnormal left ventricular   relaxation (grade 1 diastolic dysfunction). - Aortic valve: There was trivial regurgitation. - Mitral valve: There was mild regurgitation. - Left atrium: The atrium was mildly dilated. - Pulmonary arteries: Systolic pressure was mildly increased. PA   peak pressure: 39 mm Hg (S).  Impressions:  - Normal LV systolic function, grade 1 diastolic function,   sclerotic aortic valve with trace AI, mild MR, mild LAE, mild MR,   mildly elevated pulmonary pressure.   Neuro/Psych PSYCHIATRIC DISORDERS Depression  Neuromuscular disease CVA, Residual Symptoms    GI/Hepatic Neg liver ROS, hiatal hernia, GERD  Medicated,  Endo/Other  negative endocrine ROS  Renal/GU negative Renal ROS     Musculoskeletal  (+) Arthritis ,   Abdominal   Peds  Hematology negative hematology ROS (+)   Anesthesia Other Findings Day of surgery medications reviewed with the patient.  Reproductive/Obstetrics                            Anesthesia Physical Anesthesia  Plan  ASA: III  Anesthesia Plan: General   Post-op Pain Management:    Induction: Intravenous  Airway Management Planned: Oral ETT  Additional Equipment:   Intra-op Plan:   Post-operative Plan: Extubation in OR  Informed Consent: I have reviewed the patients History and Physical, chart, labs and discussed the procedure including the risks, benefits and alternatives for the proposed anesthesia with the patient or authorized representative who has indicated his/her understanding and acceptance.   Dental advisory given  Plan Discussed with: CRNA  Anesthesia Plan Comments: (Risks/benefits of general anesthesia discussed with patient including risk of damage to teeth, lips, gum, and tongue, nausea/vomiting, allergic reactions to medications, and the possibility of heart attack, stroke and death.  All patient questions answered.  Patient wishes to proceed.)        Anesthesia Quick Evaluation

## 2016-09-13 NOTE — H&P (Signed)
History and Physical    [name] WCB:[id number] DOB: Feb 26, 1941 DOA: 09/13/2016  PCP: Wenda Low, MD  Patient coming from: Home  Chief Complaint: Fall  HPI: [name] is a 76 y.o. female with medical history significant of anemia, , COPD, degenerative disc disease of lumbar spine, C-spine fracture, hypertension, hyperlipidemia, GERD, depression, macular degeneration, multiple strokes who presented to the emergency department via EMS after she sustained a fall at home hitting head and right hip. Patient stated that she fell when she was trying to get up from the bed during the night. She denied loss of consciousness, palpitations, dizziness. On arrival complained of headache and right hip pain. Fentanyl was given en route, but patient didn't have any relief of pain.  ED Course: On arrival to the ED vital signs were stable Cervical CT didn't reveal any acute fractures or subluxations, confirmed a compression C7 and T2 fractures Head CT showing no acute intracranial abnormalities, chronic atrophy and small vessel ischemic changes Chest CT was negative for acute findings Pelvic x-ray demonstrated cortical irregularity along the greater trochanter of the right hip most likely representing a nondisplaced fracture Blood work revealed mild hypokalemia with potassium 3.3, creatinine 1.02 (last normal 1.00 in October 2017 )  Review of Systems: As per HPI otherwise 10 point review of systems negative.   Ambulatory Status: Independent prior to falling  Past Medical History:  Diagnosis Date  . Anemia   . B12 deficiency   . Bladder incontinence   . Blind left eye   . Cataract   . Cervical spine fracture ([place])   . Compression fracture    C7,  upper T spine -compression fx  . COPD (chronic obstructive pulmonary disease) ([place])   . DDD (degenerative disc disease), lumbar   . Depression    major  . GERD (gastroesophageal reflux disease)   . Hiatal hernia   . Hyperlipidemia     . Hypertension   . Macular degeneration   . Osteoarthritis   . Osteoporosis   . Reactive airway disease   . Rupture of bowel ([place])   . Stroke [facility])    "mini stroke"  . Stroke [facility])     Past Surgical History:  Procedure Laterality Date  . ABDOMINAL HYSTERECTOMY    . ABDOMINAL SURGERY    . APPENDECTOMY    . BLADDER SURGERY    . CHOLECYSTECTOMY    . COLON SURGERY    . EYE SURGERY     cataracts  . JOINT REPLACEMENT    . KNEE SURGERY    . OSTOMY    . RECTOCELE REPAIR      Social History   Social History  . Marital status: Widowed    Spouse name: N/A  . Number of children: 3  . Years of education: middle sch   Occupational History  . retired     n/a   Social History Main Topics  . Smoking status: Former Smoker    Packs/day: 1.50    Years: 30.00  . Smokeless tobacco: Never Used     Comment: quit 2013  . Alcohol use No  . Drug use: No  . Sexual activity: No   Other Topics Concern  . Not on file   Social History Narrative   ** Merged History Encounter **       Patient lives at home with daughter. Caffeine Use: 4 cups daily    Allergies  Allergen Reactions  . Ampicillin-Sulbactam Sodium Other (See Comments)  Muscles draw up tight - severe headache angioedema  . Ampicillin Nausea Only and Other (See Comments)    Thrush.   . Toradol [Ketorolac Tromethamine] Nausea Only and Other (See Comments)    Headaches, back aches  . Tramadol Rash and Other (See Comments)    Headache.   Marland Kitchen Ambien [Zolpidem Tartrate] Other (See Comments)    unknown  . Cephalexin Other (See Comments)    unknown  . Ketorolac Nausea And Vomiting  . Percocet [Oxycodone-Acetaminophen] Itching and Rash    Vicodin is OK, "itches but takes Benadryl"  . Sulfamethoxazole-Trimethoprim Other (See Comments)    unknown  . Tape Other (See Comments)    Paper tape only.    Family History  Problem Relation Age of Onset  . Heart failure Mother   . Heart attack Father     Prior to  Admission medications   Medication Sig Start Date End Date Taking? Authorizing Provider  albuterol (PROVENTIL HFA;VENTOLIN HFA) 108 (90 BASE) MCG/ACT inhaler Inhale 1 puff into the lungs every 6 (six) hours as needed for wheezing or shortness of breath.   Yes Historical Provider, MD  albuterol (PROVENTIL) (2.5 MG/3ML) 0.083% nebulizer solution Take 2.5 mg by nebulization every 6 (six) hours as needed for wheezing or shortness of breath.   Yes Historical Provider, MD  ALPRAZolam Duanne Moron) 1 MG tablet Take 1 tablet (1 mg total) by mouth at bedtime as needed for anxiety. Patient taking differently: Take 1 mg by mouth at bedtime.  05/24/15  Yes Jessica U Vann, DO  amLODipine (NORVASC) 5 MG tablet Take 5 mg by mouth daily.  04/06/13  Yes Historical Provider, MD  busPIRone (BUSPAR) 5 MG tablet Take 5 mg by mouth 2 (two) times daily. 09/11/16  Yes Historical Provider, MD  citalopram (CELEXA) 20 MG tablet Take 30 mg by mouth daily.    Yes Historical Provider, MD  esomeprazole (NEXIUM) 40 MG capsule Take 40 mg by mouth 2 (two) times daily before a meal.    Yes Historical Provider, MD  gabapentin (NEURONTIN) 400 MG capsule TAKE 1 CAPSULE BY MOUTH FOUR TIMES A DAY 11/24/15  Yes Charlett Blake, MD  HYDROcodone-acetaminophen (NORCO) 7.5-325 MG tablet Take 1 tablet by mouth 3 (three) times daily. 08/15/16  Yes Bayard Hugger, NP  IRON PO Take 1 tablet by mouth 2 (two) times a week.    Yes Historical Provider, MD  lovastatin (MEVACOR) 10 MG tablet Take 10 mg by mouth daily. 09/22/14  Yes Historical Provider, MD  meloxicam (MOBIC) 15 MG tablet Take 15 mg by mouth every evening. 05/23/16  Yes Historical Provider, MD  methocarbamol (ROBAXIN) 500 MG tablet Take 250 mg by mouth 2 (two) times daily. 09/10/16  Yes Historical Provider, MD  naloxegol oxalate (MOVANTIK) 25 MG TABS tablet Take 1 tablet (25 mg total) by mouth daily. 06/14/16  Yes Bayard Hugger, NP  ondansetron (ZOFRAN) 4 MG tablet Take 4 mg by mouth every 8  (eight) hours as needed for nausea.  02/29/16  Yes Historical Provider, MD  polyethylene glycol powder (GLYCOLAX/MIRALAX) powder Take 17 g by mouth daily. For constipation   Yes Historical Provider, MD  traZODone (DESYREL) 50 MG tablet Take 0.5 tablets (25 mg total) by mouth at bedtime. 12/30/14  Yes Penni Bombard, MD  vitamin B-12 (CYANOCOBALAMIN) 1000 MCG tablet Take 2,000 mcg by mouth daily.    Yes Historical Provider, MD  VOLTAREN 1 % GEL Apply 2 g topically 4 (four) times daily as needed (for  pain).  03/29/16  Yes Historical Provider, MD  acetaminophen (TYLENOL) 325 MG tablet Take 2 tablets (650 mg total) by mouth every 6 (six) hours as needed for mild pain (or Fever >/= 101). Patient not taking: Reported on 09/13/2016 05/24/15   Geradine Girt, DO  azithromycin (ZITHROMAX Z-PAK) 250 MG tablet Take 1 tablet (250 mg total) by mouth daily. 2 pills ('500mg'$ ) day 1 then 1 pill ('250mg'$ ) days 2-5. Patient not taking: Reported on 09/13/2016 01/23/16   Virgel Manifold, MD  phenazopyridine (PYRIDIUM) 200 MG tablet Take 1 tablet (200 mg total) by mouth 3 (three) times daily as needed for pain. Patient not taking: Reported on 09/13/2016 04/16/16 04/16/17  Nance Pear, MD  predniSONE (DELTASONE) 20 MG tablet Take 2 tablets (40 mg total) by mouth daily. Patient not taking: Reported on 09/13/2016 01/23/16   Virgel Manifold, MD    Physical Exam: Vitals:   09/13/16 0400 09/13/16 0430 09/13/16 0500 09/13/16 0530  BP: (!) 159/70 (!) 161/81 (!) 150/81 138/80  Pulse: 75 68 71 74  Resp: '19 19 17 20  '$ Temp:      TempSrc:      SpO2: 93% 99% 97% 99%  Weight:      Height:         General: Appears calm and comfortable Eyes: PERRLA, EOMI, normal lids, iris ENT:  grossly normal hearing, lips & tongue, mucous membranes moist and intact Neck: no lymphoadenopathy, masses or thyromegaly Cardiovascular: RRR, 1(+) murmur, no r/g. No JVD, carotid bruits. No LE edema.  Respiratory: bilateral no wheezes, rales, rhonchi or  cracles. Normal respiratory effort. No accessory muscle use observed Abdomen: soft, non-tender, non-distended, no organomegaly or masses appreciated. BS present in all quadrants Skin: no rash, ulcers or induration seen on limited exam Musculoskeletal: grossly normal tone BUE/BLE, good ROM, no bony abnormality or joint deformities observed Psychiatric: grossly normal mood and affect, speech fluent and appropriate, alert and oriented x3 Neurologic: CN II-XII grossly intact, moves all extremities in coordinated fashion, sensation intact  Labs on Admission: I have personally reviewed following labs and imaging studies  CBC, BMP  GFR: Estimated Creatinine Clearance: 51.3 mL/min (A) (by C-G formula based on SCr of 1.02 mg/dL (H)).   Creatinine Clearance: Estimated Creatinine Clearance: 51.3 mL/min (A) (by C-G formula based on SCr of 1.02 mg/dL (H)).    Radiological Exams on Admission: Dg Chest 1 View  Result Date: 09/13/2016 CLINICAL DATA:  Fall. Dizziness leading to fall walking to the restroom. EXAM: CHEST 1 VIEW COMPARISON:  Radiograph 06/17/2016 FINDINGS: Stable borderline mild cardiomegaly. Stable tortuosity of the thoracic aorta. No pulmonary edema. No evidence of pneumothorax. No confluent airspace disease or pleural fluid. No acute osseous abnormalities are seen. Calcified granuloma again seen. No acute osseous abnormalities. IMPRESSION: No acute abnormality. Electronically Signed   By: Jeb Levering M.D.   On: 09/13/2016 04:00   Dg Pelvis 1-2 Views  Result Date: 09/13/2016 CLINICAL DATA:  Patient became dizzy and fell, striking head and right hip. Severe right hip pain. EXAM: PELVIS - 1-2 VIEW COMPARISON:  09/17/2012 FINDINGS: Cortical irregularity along the greater trochanter of the right hip probably represents nondisplaced fracture. No dislocation of the right hip. Diffuse bone demineralization. Pelvis appears intact. SI joints and symphysis pubis are not displaced. IMPRESSION:  Cortical irregularity along the greater trochanter of the right hip probably represents nondisplaced fracture. Electronically Signed   By: Lucienne Capers M.D.   On: 09/13/2016 03:59   Ct Head Wo Contrast  Result Date:  09/13/2016 CLINICAL DATA:  Fall. EXAM: CT HEAD WITHOUT CONTRAST CT CERVICAL SPINE WITHOUT CONTRAST TECHNIQUE: Multidetector CT imaging of the head and cervical spine was performed following the standard protocol without intravenous contrast. Multiplanar CT image reconstructions of the cervical spine were also generated. COMPARISON:  CT head 07/02/2016. CT head and cervical spine 03/26/2016 FINDINGS: CT HEAD FINDINGS Brain: No evidence of acute infarction, hemorrhage, hydrocephalus, extra-axial collection or mass lesion/mass effect. Diffuse cerebral atrophy. Ventricular dilatation consistent with central atrophy. Low-attenuation changes in the deep white matter consistent small vessel ischemia. Calcification in the basal ganglia. Vascular: No hyperdense vessel or unexpected calcification. Skull: Normal. Negative for fracture or focal lesion. Sinuses/Orbits: No acute finding. Other: No significant change since previous study. CT CERVICAL SPINE FINDINGS Alignment: Normal alignment of the cervical spine and facet joints. Skull base and vertebrae: Skullbase appears intact. Endplate compression deformities at C7 and T2 without change since prior study. No focal bone lesion or bone destruction. Soft tissues and spinal canal: No prevertebral fluid or swelling. No visible canal hematoma. Disc levels: Disc space heights are preserved. Degenerative changes in the cervical facet joints and at C1 -2. Upper chest: Visualized lung apices are clear. Other: Degenerative changes in the temporomandibular joints. IMPRESSION: No acute intracranial abnormalities. Chronic atrophy and small vessel ischemic changes. Normal alignment of the cervical spine. Chronic endplate compression deformities at C7 and T2 unchanged  since previous study, likely representing osteoporosis. Mild degenerative changes. No acute displaced fractures identified. Electronically Signed   By: Lucienne Capers M.D.   On: 09/13/2016 04:23   Ct Cervical Spine Wo Contrast  Result Date: 09/13/2016 CLINICAL DATA:  Fall. EXAM: CT HEAD WITHOUT CONTRAST CT CERVICAL SPINE WITHOUT CONTRAST TECHNIQUE: Multidetector CT imaging of the head and cervical spine was performed following the standard protocol without intravenous contrast. Multiplanar CT image reconstructions of the cervical spine were also generated. COMPARISON:  CT head 07/02/2016. CT head and cervical spine 03/26/2016 FINDINGS: CT HEAD FINDINGS Brain: No evidence of acute infarction, hemorrhage, hydrocephalus, extra-axial collection or mass lesion/mass effect. Diffuse cerebral atrophy. Ventricular dilatation consistent with central atrophy. Low-attenuation changes in the deep white matter consistent small vessel ischemia. Calcification in the basal ganglia. Vascular: No hyperdense vessel or unexpected calcification. Skull: Normal. Negative for fracture or focal lesion. Sinuses/Orbits: No acute finding. Other: No significant change since previous study. CT CERVICAL SPINE FINDINGS Alignment: Normal alignment of the cervical spine and facet joints. Skull base and vertebrae: Skullbase appears intact. Endplate compression deformities at C7 and T2 without change since prior study. No focal bone lesion or bone destruction. Soft tissues and spinal canal: No prevertebral fluid or swelling. No visible canal hematoma. Disc levels: Disc space heights are preserved. Degenerative changes in the cervical facet joints and at C1 -2. Upper chest: Visualized lung apices are clear. Other: Degenerative changes in the temporomandibular joints. IMPRESSION: No acute intracranial abnormalities. Chronic atrophy and small vessel ischemic changes. Normal alignment of the cervical spine. Chronic endplate compression deformities at  C7 and T2 unchanged since previous study, likely representing osteoporosis. Mild degenerative changes. No acute displaced fractures identified. Electronically Signed   By: Lucienne Capers M.D.   On: 09/13/2016 04:23   Dg Femur, Min 2 Views Right  Result Date: 09/13/2016 CLINICAL DATA:  Right hip pain after a fall EXAM: RIGHT FEMUR 2 VIEWS COMPARISON:  None. FINDINGS: Nondisplaced fracture of the greater trochanter of the right hip, possibly with focal extension into the inter trochanteric region. No dislocation of the right hip. Degenerative  changes in the right hip. Femoral shaft appears intact. Previous left knee arthroplasty. Components as visualized appear well-seated. No significant effusion. IMPRESSION: Nondisplaced fracture of the greater trochanter of the right hip with possible focal extension to the inter trochanteric region. Electronically Signed   By: Lucienne Capers M.D.   On: 09/13/2016 04:00    EKG: Independently reviewed -  Assessment/Plan Principal Problem:   Hip fracture ([place]) Active Problems:   HLD (hyperlipidemia)   HTN (hypertension)   GERD (gastroesophageal reflux disease)    Full with right hip fracture - Dr. Sharol Given was notified about the case and is to see patient Patient had a recent fall was seen on 09/10/2016 in the office. She had a left knee replacement approximately 15 years ago and it felt like it starts to the Route giving her instability and if the strength and range of motion exercises would not produce desirable effect patient might need a revision of the left knee  Hypertension - currently stable Continue Norvasc and adjust the doses if needed depending on the BP readings   GERD Continue PPI  Hyperlipidemia Continue statin therapy    DVT prophylaxis: Heparin Code Status: full Family Communication: none Disposition Plan: Med Surg Consults called: Ortho - Dr. Sharol Given, by EDP Admission status: Inpatient   York Grice, [place] Pager:  [phone] [facility]  If 7PM-7AM, please contact night-coverage [email or web address] Password [email or web address]  09/13/2016, 8:04 AM

## 2016-09-13 NOTE — ED Provider Notes (Signed)
[place] DEPT Provider Note   CSN: [id number] Arrival date & time: 09/13/16  6948  By signing my name below, I, Collene Leyden, attest that this documentation has been prepared under the direction and in the presence of Isla Pence, MD. Electronically Signed: Collene Leyden, Scribe. 09/13/16. 3:19 AM.   History   Chief Complaint Chief Complaint  Patient presents with  . Fall    HPI Comments: [name] is a 76 y.o. female with a history of multiple strokes, C-spine fracture, COPD, HLD, and HTN, who presents to the Emergency Department by ambulance, complaining of sudden-onset, constant right hip pain s/p fall that happened earlier tonight. Patient states she fell attempting to get out of the bed, when she hit her head and landed on her right hip. Patient denies any loss of consciousness.  Patient reports an associated headache. Patient was given fentanyl while en route with no relief. Patient denies any neck pain, back pain, numbness, weakness, or any other symptoms.   The history is provided by the patient. No language interpreter was used.    Past Medical History:  Diagnosis Date  . Anemia   . B12 deficiency   . Bladder incontinence   . Blind left eye   . Cataract   . Cervical spine fracture ([place])   . Compression fracture    C7,  upper T spine -compression fx  . COPD (chronic obstructive pulmonary disease) ([place])   . DDD (degenerative disc disease), lumbar   . Depression    major  . GERD (gastroesophageal reflux disease)   . Hiatal hernia   . Hyperlipidemia   . Hypertension   . Macular degeneration   . Osteoarthritis   . Osteoporosis   . Reactive airway disease   . Rupture of bowel ([place])   . Stroke [facility])    "mini stroke"  . Stroke [facility])     Patient Active Problem List   Diagnosis Date Noted  . Hip fracture ([place]) 09/13/2016  . Chronic pain of both shoulders 08/12/2016  . Spinal stenosis in cervical region 06/27/2015  . Syncope 05/22/2015  .  Diffuse myofascial pain syndrome 03/24/2015  . Sepsis ([place]) 10/08/2014  . Bacteremia 10/07/2014  . Leukopenia 10/07/2014  . Thrombocytopenia ([place]) 10/07/2014  . Fever 10/05/2014  . Hypoxia 10/05/2014  . HLD (hyperlipidemia) 07/30/2013  . HTN (hypertension) 07/30/2013  . GERD (gastroesophageal reflux disease) 07/30/2013  . Chest pain 07/29/2013  . Dizziness and giddiness 06/18/2013  . Gait difficulty 05/03/2013  . Memory loss 05/03/2013    Past Surgical History:  Procedure Laterality Date  . ABDOMINAL HYSTERECTOMY    . ABDOMINAL SURGERY    . APPENDECTOMY    . BLADDER SURGERY    . CHOLECYSTECTOMY    . COLON SURGERY    . EYE SURGERY     cataracts  . JOINT REPLACEMENT    . KNEE SURGERY    . OSTOMY    . RECTOCELE REPAIR      OB History    Gravida Para Term Preterm AB Living   0 0 0 0 0     SAB TAB Ectopic Multiple Live Births   0 0 0           Home Medications    Prior to Admission medications   Medication Sig Start Date End Date Taking? Authorizing Provider  albuterol (PROVENTIL HFA;VENTOLIN HFA) 108 (90 BASE) MCG/ACT inhaler Inhale 1 puff into the lungs every 6 (six) hours as needed for wheezing or shortness of  breath.   Yes Historical Provider, MD  albuterol (PROVENTIL) (2.5 MG/3ML) 0.083% nebulizer solution Take 2.5 mg by nebulization every 6 (six) hours as needed for wheezing or shortness of breath.   Yes Historical Provider, MD  ALPRAZolam Duanne Moron) 1 MG tablet Take 1 tablet (1 mg total) by mouth at bedtime as needed for anxiety. Patient taking differently: Take 1 mg by mouth at bedtime.  05/24/15  Yes Jessica U Vann, DO  amLODipine (NORVASC) 5 MG tablet Take 5 mg by mouth daily.  04/06/13  Yes Historical Provider, MD  busPIRone (BUSPAR) 5 MG tablet Take 5 mg by mouth 2 (two) times daily. 09/11/16  Yes Historical Provider, MD  citalopram (CELEXA) 20 MG tablet Take 30 mg by mouth daily.    Yes Historical Provider, MD  esomeprazole (NEXIUM) 40 MG capsule Take 40 mg by  mouth 2 (two) times daily before a meal.    Yes Historical Provider, MD  gabapentin (NEURONTIN) 400 MG capsule TAKE 1 CAPSULE BY MOUTH FOUR TIMES A DAY 11/24/15  Yes Charlett Blake, MD  HYDROcodone-acetaminophen (NORCO) 7.5-325 MG tablet Take 1 tablet by mouth 3 (three) times daily. 08/15/16  Yes Bayard Hugger, NP  IRON PO Take 1 tablet by mouth 2 (two) times a week.    Yes Historical Provider, MD  lovastatin (MEVACOR) 10 MG tablet Take 10 mg by mouth daily. 09/22/14  Yes Historical Provider, MD  meloxicam (MOBIC) 15 MG tablet Take 15 mg by mouth every evening. 05/23/16  Yes Historical Provider, MD  methocarbamol (ROBAXIN) 500 MG tablet Take 250 mg by mouth 2 (two) times daily. 09/10/16  Yes Historical Provider, MD  naloxegol oxalate (MOVANTIK) 25 MG TABS tablet Take 1 tablet (25 mg total) by mouth daily. 06/14/16  Yes Bayard Hugger, NP  ondansetron (ZOFRAN) 4 MG tablet Take 4 mg by mouth every 8 (eight) hours as needed for nausea.  02/29/16  Yes Historical Provider, MD  polyethylene glycol powder (GLYCOLAX/MIRALAX) powder Take 17 g by mouth daily. For constipation   Yes Historical Provider, MD  traZODone (DESYREL) 50 MG tablet Take 0.5 tablets (25 mg total) by mouth at bedtime. 12/30/14  Yes Penni Bombard, MD  vitamin B-12 (CYANOCOBALAMIN) 1000 MCG tablet Take 2,000 mcg by mouth daily.    Yes Historical Provider, MD  VOLTAREN 1 % GEL Apply 2 g topically 4 (four) times daily as needed (for pain).  03/29/16  Yes Historical Provider, MD  acetaminophen (TYLENOL) 325 MG tablet Take 2 tablets (650 mg total) by mouth every 6 (six) hours as needed for mild pain (or Fever >/= 101). Patient not taking: Reported on 09/13/2016 05/24/15   Geradine Girt, DO  azithromycin (ZITHROMAX Z-PAK) 250 MG tablet Take 1 tablet (250 mg total) by mouth daily. 2 pills ('500mg'$ ) day 1 then 1 pill ('250mg'$ ) days 2-5. Patient not taking: Reported on 09/13/2016 01/23/16   Virgel Manifold, MD  phenazopyridine (PYRIDIUM) 200 MG tablet  Take 1 tablet (200 mg total) by mouth 3 (three) times daily as needed for pain. Patient not taking: Reported on 09/13/2016 04/16/16 04/16/17  Nance Pear, MD  predniSONE (DELTASONE) 20 MG tablet Take 2 tablets (40 mg total) by mouth daily. Patient not taking: Reported on 09/13/2016 01/23/16   Virgel Manifold, MD    Family History Family History  Problem Relation Age of Onset  . Heart failure Mother   . Heart attack Father     Social History Social History  Substance Use Topics  . Smoking  status: Former Smoker    Packs/day: 1.50    Years: 30.00  . Smokeless tobacco: Never Used     Comment: quit 2013  . Alcohol use No     Allergies   Ampicillin-sulbactam sodium; Ampicillin; Toradol [ketorolac tromethamine]; Tramadol; Ambien [zolpidem tartrate]; Cephalexin; Ketorolac; Percocet [oxycodone-acetaminophen]; Sulfamethoxazole-trimethoprim; and Tape   Review of Systems Review of Systems  Musculoskeletal: Positive for arthralgias (right hip ).     Physical Exam Updated Vital Signs BP (!) 161/81   Pulse 68   Temp 98.3 F (36.8 C) (Oral)   Resp 19   Ht '5\' 6"'$  (1.676 m)   Wt 180 lb (81.6 kg)   SpO2 99%   BMI 29.05 kg/m   Physical Exam  Constitutional: She is oriented to person, place, and time. She appears well-developed.  HENT:  Head: Normocephalic and atraumatic.  Mouth/Throat: Oropharynx is clear and moist.  Hematoma to the posterior head.   Eyes: Conjunctivae and EOM are normal. Pupils are equal, round, and reactive to light.  Neck: Normal range of motion. Neck supple.  Cardiovascular: Normal rate.   Pulmonary/Chest: Effort normal.  Abdominal: Soft. Bowel sounds are normal.  Musculoskeletal: She exhibits tenderness. She exhibits no edema or deformity.  Tenderness to the cervical spine. Pain and shortening of the right hip.   Neurological: She is alert and oriented to person, place, and time.  Skin: Skin is warm and dry.  Nursing note and vitals reviewed.    ED  Treatments / Results  DIAGNOSTIC STUDIES: Oxygen Saturation is 94% on RA, adequate by my interpretation.    COORDINATION OF CARE: 3:18 AM Discussed treatment plan with pt at bedside and pt agreed to plan, which includes pain medication and imaging.   Labs (all labs ordered are listed, but only abnormal results are displayed) Labs Reviewed  BASIC METABOLIC PANEL - Abnormal; Notable for the following:       Result Value   Potassium 3.3 (*)    Glucose, Bld 113 (*)    Creatinine, Ser 1.02 (*)    GFR calc non Af Amer 52 (*)    All other components within normal limits  URINALYSIS, ROUTINE W REFLEX MICROSCOPIC - Abnormal; Notable for the following:    Color, Urine STRAW (*)    Specific Gravity, Urine 1.003 (*)    All other components within normal limits  CBC WITH DIFFERENTIAL/PLATELET  PROTIME-INR  TYPE AND SCREEN    EKG  EKG Interpretation  Date/Time:  Friday September 13 2016 03:16:49 EDT Ventricular Rate:  70 PR Interval:    QRS Duration: 89 QT Interval:  411 QTC Calculation: 444 R Axis:   32 Text Interpretation:  Sinus rhythm Abnormal R-wave progression, early transition Borderline T abnormalities, inferior leads No significant change since last tracing Confirmed by [facility] MD, Bryce Cheever ([id number]) on 09/13/2016 3:23:15 AM       Radiology Dg Chest 1 View  Result Date: 09/13/2016 CLINICAL DATA:  Fall. Dizziness leading to fall walking to the restroom. EXAM: CHEST 1 VIEW COMPARISON:  Radiograph 06/17/2016 FINDINGS: Stable borderline mild cardiomegaly. Stable tortuosity of the thoracic aorta. No pulmonary edema. No evidence of pneumothorax. No confluent airspace disease or pleural fluid. No acute osseous abnormalities are seen. Calcified granuloma again seen. No acute osseous abnormalities. IMPRESSION: No acute abnormality. Electronically Signed   By: Jeb Levering M.D.   On: 09/13/2016 04:00   Dg Pelvis 1-2 Views  Result Date: 09/13/2016 CLINICAL DATA:  Patient became dizzy and  fell, striking head and right  hip. Severe right hip pain. EXAM: PELVIS - 1-2 VIEW COMPARISON:  09/17/2012 FINDINGS: Cortical irregularity along the greater trochanter of the right hip probably represents nondisplaced fracture. No dislocation of the right hip. Diffuse bone demineralization. Pelvis appears intact. SI joints and symphysis pubis are not displaced. IMPRESSION: Cortical irregularity along the greater trochanter of the right hip probably represents nondisplaced fracture. Electronically Signed   By: Lucienne Capers M.D.   On: 09/13/2016 03:59   Ct Head Wo Contrast  Result Date: 09/13/2016 CLINICAL DATA:  Fall. EXAM: CT HEAD WITHOUT CONTRAST CT CERVICAL SPINE WITHOUT CONTRAST TECHNIQUE: Multidetector CT imaging of the head and cervical spine was performed following the standard protocol without intravenous contrast. Multiplanar CT image reconstructions of the cervical spine were also generated. COMPARISON:  CT head 07/02/2016. CT head and cervical spine 03/26/2016 FINDINGS: CT HEAD FINDINGS Brain: No evidence of acute infarction, hemorrhage, hydrocephalus, extra-axial collection or mass lesion/mass effect. Diffuse cerebral atrophy. Ventricular dilatation consistent with central atrophy. Low-attenuation changes in the deep white matter consistent small vessel ischemia. Calcification in the basal ganglia. Vascular: No hyperdense vessel or unexpected calcification. Skull: Normal. Negative for fracture or focal lesion. Sinuses/Orbits: No acute finding. Other: No significant change since previous study. CT CERVICAL SPINE FINDINGS Alignment: Normal alignment of the cervical spine and facet joints. Skull base and vertebrae: Skullbase appears intact. Endplate compression deformities at C7 and T2 without change since prior study. No focal bone lesion or bone destruction. Soft tissues and spinal canal: No prevertebral fluid or swelling. No visible canal hematoma. Disc levels: Disc space heights are preserved.  Degenerative changes in the cervical facet joints and at C1 -2. Upper chest: Visualized lung apices are clear. Other: Degenerative changes in the temporomandibular joints. IMPRESSION: No acute intracranial abnormalities. Chronic atrophy and small vessel ischemic changes. Normal alignment of the cervical spine. Chronic endplate compression deformities at C7 and T2 unchanged since previous study, likely representing osteoporosis. Mild degenerative changes. No acute displaced fractures identified. Electronically Signed   By: Lucienne Capers M.D.   On: 09/13/2016 04:23   Ct Cervical Spine Wo Contrast  Result Date: 09/13/2016 CLINICAL DATA:  Fall. EXAM: CT HEAD WITHOUT CONTRAST CT CERVICAL SPINE WITHOUT CONTRAST TECHNIQUE: Multidetector CT imaging of the head and cervical spine was performed following the standard protocol without intravenous contrast. Multiplanar CT image reconstructions of the cervical spine were also generated. COMPARISON:  CT head 07/02/2016. CT head and cervical spine 03/26/2016 FINDINGS: CT HEAD FINDINGS Brain: No evidence of acute infarction, hemorrhage, hydrocephalus, extra-axial collection or mass lesion/mass effect. Diffuse cerebral atrophy. Ventricular dilatation consistent with central atrophy. Low-attenuation changes in the deep white matter consistent small vessel ischemia. Calcification in the basal ganglia. Vascular: No hyperdense vessel or unexpected calcification. Skull: Normal. Negative for fracture or focal lesion. Sinuses/Orbits: No acute finding. Other: No significant change since previous study. CT CERVICAL SPINE FINDINGS Alignment: Normal alignment of the cervical spine and facet joints. Skull base and vertebrae: Skullbase appears intact. Endplate compression deformities at C7 and T2 without change since prior study. No focal bone lesion or bone destruction. Soft tissues and spinal canal: No prevertebral fluid or swelling. No visible canal hematoma. Disc levels: Disc space  heights are preserved. Degenerative changes in the cervical facet joints and at C1 -2. Upper chest: Visualized lung apices are clear. Other: Degenerative changes in the temporomandibular joints. IMPRESSION: No acute intracranial abnormalities. Chronic atrophy and small vessel ischemic changes. Normal alignment of the cervical spine. Chronic endplate compression deformities at C7 and  T2 unchanged since previous study, likely representing osteoporosis. Mild degenerative changes. No acute displaced fractures identified. Electronically Signed   By: Lucienne Capers M.D.   On: 09/13/2016 04:23   Dg Femur, Min 2 Views Right  Result Date: 09/13/2016 CLINICAL DATA:  Right hip pain after a fall EXAM: RIGHT FEMUR 2 VIEWS COMPARISON:  None. FINDINGS: Nondisplaced fracture of the greater trochanter of the right hip, possibly with focal extension into the inter trochanteric region. No dislocation of the right hip. Degenerative changes in the right hip. Femoral shaft appears intact. Previous left knee arthroplasty. Components as visualized appear well-seated. No significant effusion. IMPRESSION: Nondisplaced fracture of the greater trochanter of the right hip with possible focal extension to the inter trochanteric region. Electronically Signed   By: Lucienne Capers M.D.   On: 09/13/2016 04:00    Procedures Procedures (including critical care time)  Medications Ordered in ED Medications  fentaNYL (SUBLIMAZE) injection 50 mcg (50 mcg Intravenous Given 09/13/16 0331)  ondansetron (ZOFRAN) injection 4 mg (4 mg Intravenous Given 09/13/16 0331)  HYDROmorphone (DILAUDID) injection 0.5 mg (0.5 mg Intravenous Given 09/13/16 0444)     Initial Impression / Assessment and Plan / ED Course  I have reviewed the triage vital signs and the nursing notes.  Pertinent labs & imaging results that were available during my care of the patient were reviewed by me and considered in my medical decision making (see chart for  details).    Pt d/w Dr. Daryll Drown (triad) who will admit.  Pt also d/w Dr. Sharol Given (ortho).    Final Clinical Impressions(s) / ED Diagnoses   Final diagnoses:  Closed fracture of right hip, initial encounter [facility])  Fall, initial encounter  Injury of head, initial encounter    New Prescriptions New Prescriptions   No medications on file   I personally performed the services described in this documentation, which was scribed in my presence. The recorded information has been reviewed and is accurate.     Isla Pence, MD 09/13/16 [phone]

## 2016-09-13 NOTE — Op Note (Signed)
   Date of Surgery: 09/13/2016  INDICATIONS: [name] is a 76 y.o.-year-old female who sustained a right hip fracture. The risks and benefits of the procedure discussed with the patient prior to the procedure and all questions were answered; consent was obtained.  PREOPERATIVE DIAGNOSIS: right hip fracture   POSTOPERATIVE DIAGNOSIS: Same   PROCEDURE: Treatment of intertrochanteric fracture with intramedullary implant. CPT [phone]   SURGEON: N. Eduard Roux, M.D.   ANESTHESIA: general   IV FLUIDS AND URINE: See anesthesia record   ESTIMATED BLOOD LOSS: 300 cc  IMPLANTS: [employer] InterTAN 10 x 18, 95/90  DRAINS: None.   COMPLICATIONS: None.   DESCRIPTION OF PROCEDURE: The patient was brought to the operating room and placed supine on the operating table. The patient's leg had been signed prior to the procedure. The patient had the anesthesia placed by the anesthesiologist. The prep verification and incision time-outs were performed to confirm that this was the correct patient, site, side and location. The patient had an SCD on the opposite lower extremity. The patient did receive antibiotics prior to the incision and was re-dosed during the procedure as needed at indicated intervals. The patient was positioned on the fracture table with the table in traction and internal rotation to reduce the hip. The well leg was placed in a scissor position and all bony prominences were well-padded. The patient had the lower extremity prepped and draped in the standard surgical fashion. The incision was made 4 finger breadths superior to the greater trochanter. A guide pin was inserted into the tip of the greater trochanter under fluoroscopic guidance. An opening reamer was used to gain access to the femoral canal. The nail length was measured and inserted down the femoral canal to its proper depth. The appropriate version of insertion for the lag screw was found under fluoroscopy. A pin was inserted  up the femoral neck through the jig. Then, a second antirotation pin was inserted inferior to the first pin. The length of the lag screw was then measured. The lag screw was inserted as near to center-center in the head as possible. The antirotation pin was then taken out and an interdigitating compression screw was placed in its place. The leg was taken out of traction, then the interdigitating compression screw was used to compress across the fracture. Compression was visualized on serial xrays. A distal interlocking screw was placed through the jig.  The wound was copiously irrigated with saline and the subcutaneous layer closed with 2.0 vicryl and the skin was reapproximated with staples. The wounds were cleaned and dried a final time and a sterile dressing was placed. The hip was taken through a range of motion at the end of the case under fluoroscopic imaging to visualize the approach-withdraw phenomenon and confirm implant length in the head. The patient was then awakened from anesthesia and taken to the recovery room in stable condition. All counts were correct at the end of the case.   POSTOPERATIVE PLAN: The patient will be weight bearing as tolerated and will return in 2 weeks for staple removal and the patient will receive DVT prophylaxis based on other medications, activity level, and risk ratio of bleeding to thrombosis.   Nicole Cecil, MD [place] 4:43 PM

## 2016-09-13 NOTE — Transfer of Care (Signed)
Immediate Anesthesia Transfer of Care Note  Patient: [name]  Procedure(s) Performed: Procedure(s): INTRAMEDULLARY (IM) NAIL INTERTROCHANTRIC RIGHT (Right)  Patient Location: PACU  Anesthesia Type:General  Level of Consciousness: awake, oriented, sedated, patient cooperative and responds to stimulation  Airway & Oxygen Therapy: Patient Spontanous Breathing and Patient connected to nasal cannula oxygen  Post-op Assessment: Report given to RN, Post -op Vital signs reviewed and stable, Patient moving all extremities and Patient moving all extremities X 4  Post vital signs: Reviewed and stable  Last Vitals:  Vitals:   09/13/16 0530 09/13/16 1657  BP: 138/80   Pulse: 74   Resp: 20   Temp:  37.4 C    Last Pain:  Vitals:   09/13/16 1200  TempSrc:   PainSc: Asleep         Complications: No apparent anesthesia complications

## 2016-09-13 NOTE — ED Triage Notes (Signed)
BIB EMS from home, pt became dizzy while walking to restroom, fell and hit head and R hip. Given 200 fentanyl en route. VSS. EPD at bedside

## 2016-09-13 NOTE — Anesthesia Procedure Notes (Signed)
Procedure Name: Intubation Date/Time: 09/13/2016 3:36 PM Performed by: Neldon Newport Pre-anesthesia Checklist: Timeout performed, Patient being monitored, Suction available, Emergency Drugs available and Patient identified Patient Re-evaluated:Patient Re-evaluated prior to inductionOxygen Delivery Method: Circle system utilized Preoxygenation: Pre-oxygenation with 100% oxygen Intubation Type: IV induction Ventilation: Mask ventilation without difficulty Laryngoscope Size: Mac and 3 Grade View: Grade I Tube type: Oral Number of attempts: 1 Placement Confirmation: breath sounds checked- equal and bilateral,  positive ETCO2 and ETT inserted through vocal cords under direct vision Secured at: 22 cm Tube secured with: Tape Dental Injury: Teeth and Oropharynx as per pre-operative assessment

## 2016-09-13 NOTE — Consult Note (Signed)
Reason for Consult:Right hip fx Referring Physician: E [name] is an 75 y.o. female.  HPI: [name] got up last night from bed and fell. She denied any presyncope or syncope. She was brought to the ED and was diagnosed with an intertroch hip fx. She was admitted by the medicine service with an extensive medical history. She had recently been medicated and was unable to stay aroused for history or exam so history was gleaned from chart and daughter.  Past Medical History:  Diagnosis Date  . Anemia   . B12 deficiency   . Bladder incontinence   . Blind left eye   . Cataract   . Cervical spine fracture ([place])   . Compression fracture    C7,  upper T spine -compression fx  . COPD (chronic obstructive pulmonary disease) ([place])   . DDD (degenerative disc disease), lumbar   . Depression    major  . GERD (gastroesophageal reflux disease)   . Hiatal hernia   . Hyperlipidemia   . Hypertension   . Macular degeneration   . Osteoarthritis   . Osteoporosis   . Reactive airway disease   . Rupture of bowel ([place])   . Stroke [facility])    "mini stroke"  . Stroke [facility])     Past Surgical History:  Procedure Laterality Date  . ABDOMINAL HYSTERECTOMY    . ABDOMINAL SURGERY    . APPENDECTOMY    . BLADDER SURGERY    . CHOLECYSTECTOMY    . COLON SURGERY    . EYE SURGERY     cataracts  . JOINT REPLACEMENT    . KNEE SURGERY    . OSTOMY    . RECTOCELE REPAIR      Family History  Problem Relation Age of Onset  . Heart failure Mother   . Heart attack Father     Social History:  reports that she has quit smoking. She has a 45.00 pack-year smoking history. She has never used smokeless tobacco. She reports that she does not drink alcohol or use drugs.  Allergies:  Allergies  Allergen Reactions  . Ampicillin-Sulbactam Sodium Other (See Comments)    Muscles draw up tight - severe headache angioedema  . Ampicillin Nausea Only and Other (See Comments)    Thrush.   . Toradol  [Ketorolac Tromethamine] Nausea Only and Other (See Comments)    Headaches, back aches  . Tramadol Rash and Other (See Comments)    Headache.   Marland Kitchen Ambien [Zolpidem Tartrate] Other (See Comments)    unknown  . Cephalexin Other (See Comments)    unknown  . Ketorolac Nausea And Vomiting  . Percocet [Oxycodone-Acetaminophen] Itching and Rash    Vicodin is OK, "itches but takes Benadryl"  . Sulfamethoxazole-Trimethoprim Other (See Comments)    unknown  . Tape Other (See Comments)    Paper tape only.    Medications: I have reviewed the patient's current medications.  Results for orders placed or performed during the hospital encounter of 09/13/16 (from the past 48 hour(s))  Basic metabolic panel     Status: Abnormal   Collection Time: 09/13/16  3:14 AM  Result Value Ref Range   Sodium 143 135 - 145 mmol/L   Potassium 3.3 (L) 3.5 - 5.1 mmol/L   Chloride 104 101 - 111 mmol/L   CO2 26 22 - 32 mmol/L   Glucose, Bld 113 (H) 65 - 99 mg/dL   BUN 7 6 - 20 mg/dL   Creatinine, Ser 1.02 (  H) 0.44 - 1.00 mg/dL   Calcium 9.5 8.9 - 10.3 mg/dL   GFR calc non Af Amer 52 (L) >60 mL/min   GFR calc Af Amer >60 >60 mL/min    Comment: (NOTE) The eGFR has been calculated using the CKD EPI equation. This calculation has not been validated in all clinical situations. eGFR's persistently <60 mL/min signify possible Chronic Kidney Disease.    Anion gap 13 5 - 15  CBC WITH DIFFERENTIAL     Status: None   Collection Time: 09/13/16  3:14 AM  Result Value Ref Range   WBC 6.1 4.0 - 10.5 K/uL   RBC 4.77 3.87 - 5.11 MIL/uL   Hemoglobin 14.1 12.0 - 15.0 g/dL   HCT 43.7 36.0 - 46.0 %   MCV 91.6 78.0 - 100.0 fL   MCH 29.6 26.0 - 34.0 pg   MCHC 32.3 30.0 - 36.0 g/dL   RDW 14.1 11.5 - 15.5 %   Platelets 175 150 - 400 K/uL   Neutrophils Relative % 54 %   Neutro Abs 3.3 1.7 - 7.7 K/uL   Lymphocytes Relative 32 %   Lymphs Abs 2.0 0.7 - 4.0 K/uL   Monocytes Relative 9 %   Monocytes Absolute 0.5 0.1 - 1.0 K/uL    Eosinophils Relative 5 %   Eosinophils Absolute 0.3 0.0 - 0.7 K/uL   Basophils Relative 0 %   Basophils Absolute 0.0 0.0 - 0.1 K/uL  Protime-INR     Status: None   Collection Time: 09/13/16  3:14 AM  Result Value Ref Range   Prothrombin Time 13.2 11.4 - 15.2 seconds   INR 1.00   Urinalysis, Routine w reflex microscopic     Status: Abnormal   Collection Time: 09/13/16  3:14 AM  Result Value Ref Range   Color, Urine STRAW (A) YELLOW   APPearance CLEAR CLEAR   Specific Gravity, Urine 1.003 (L) 1.005 - 1.030   pH 7.0 5.0 - 8.0   Glucose, UA NEGATIVE NEGATIVE mg/dL   Hgb urine dipstick NEGATIVE NEGATIVE   Bilirubin Urine NEGATIVE NEGATIVE   Ketones, ur NEGATIVE NEGATIVE mg/dL   Protein, ur NEGATIVE NEGATIVE mg/dL   Nitrite NEGATIVE NEGATIVE   Leukocytes, UA NEGATIVE NEGATIVE  Type and screen [facility]     Status: None   Collection Time: 09/13/16  3:31 AM  Result Value Ref Range   ABO/RH(D) AB POS    Antibody Screen NEG    Sample Expiration 09/16/2016     Dg Chest 1 View  Result Date: 09/13/2016 CLINICAL DATA:  Fall. Dizziness leading to fall walking to the restroom. EXAM: CHEST 1 VIEW COMPARISON:  Radiograph 06/17/2016 FINDINGS: Stable borderline mild cardiomegaly. Stable tortuosity of the thoracic aorta. No pulmonary edema. No evidence of pneumothorax. No confluent airspace disease or pleural fluid. No acute osseous abnormalities are seen. Calcified granuloma again seen. No acute osseous abnormalities. IMPRESSION: No acute abnormality. Electronically Signed   By: Jeb Levering M.D.   On: 09/13/2016 04:00   Dg Pelvis 1-2 Views  Result Date: 09/13/2016 CLINICAL DATA:  Patient became dizzy and fell, striking head and right hip. Severe right hip pain. EXAM: PELVIS - 1-2 VIEW COMPARISON:  09/17/2012 FINDINGS: Cortical irregularity along the greater trochanter of the right hip probably represents nondisplaced fracture. No dislocation of the right hip. Diffuse bone  demineralization. Pelvis appears intact. SI joints and symphysis pubis are not displaced. IMPRESSION: Cortical irregularity along the greater trochanter of the right hip probably represents nondisplaced  fracture. Electronically Signed   By: Lucienne Capers M.D.   On: 09/13/2016 03:59   Ct Head Wo Contrast  Result Date: 09/13/2016 CLINICAL DATA:  Fall. EXAM: CT HEAD WITHOUT CONTRAST CT CERVICAL SPINE WITHOUT CONTRAST TECHNIQUE: Multidetector CT imaging of the head and cervical spine was performed following the standard protocol without intravenous contrast. Multiplanar CT image reconstructions of the cervical spine were also generated. COMPARISON:  CT head 07/02/2016. CT head and cervical spine 03/26/2016 FINDINGS: CT HEAD FINDINGS Brain: No evidence of acute infarction, hemorrhage, hydrocephalus, extra-axial collection or mass lesion/mass effect. Diffuse cerebral atrophy. Ventricular dilatation consistent with central atrophy. Low-attenuation changes in the deep white matter consistent small vessel ischemia. Calcification in the basal ganglia. Vascular: No hyperdense vessel or unexpected calcification. Skull: Normal. Negative for fracture or focal lesion. Sinuses/Orbits: No acute finding. Other: No significant change since previous study. CT CERVICAL SPINE FINDINGS Alignment: Normal alignment of the cervical spine and facet joints. Skull base and vertebrae: Skullbase appears intact. Endplate compression deformities at C7 and T2 without change since prior study. No focal bone lesion or bone destruction. Soft tissues and spinal canal: No prevertebral fluid or swelling. No visible canal hematoma. Disc levels: Disc space heights are preserved. Degenerative changes in the cervical facet joints and at C1 -2. Upper chest: Visualized lung apices are clear. Other: Degenerative changes in the temporomandibular joints. IMPRESSION: No acute intracranial abnormalities. Chronic atrophy and small vessel ischemic changes.  Normal alignment of the cervical spine. Chronic endplate compression deformities at C7 and T2 unchanged since previous study, likely representing osteoporosis. Mild degenerative changes. No acute displaced fractures identified. Electronically Signed   By: Lucienne Capers M.D.   On: 09/13/2016 04:23   Ct Cervical Spine Wo Contrast  Result Date: 09/13/2016 CLINICAL DATA:  Fall. EXAM: CT HEAD WITHOUT CONTRAST CT CERVICAL SPINE WITHOUT CONTRAST TECHNIQUE: Multidetector CT imaging of the head and cervical spine was performed following the standard protocol without intravenous contrast. Multiplanar CT image reconstructions of the cervical spine were also generated. COMPARISON:  CT head 07/02/2016. CT head and cervical spine 03/26/2016 FINDINGS: CT HEAD FINDINGS Brain: No evidence of acute infarction, hemorrhage, hydrocephalus, extra-axial collection or mass lesion/mass effect. Diffuse cerebral atrophy. Ventricular dilatation consistent with central atrophy. Low-attenuation changes in the deep white matter consistent small vessel ischemia. Calcification in the basal ganglia. Vascular: No hyperdense vessel or unexpected calcification. Skull: Normal. Negative for fracture or focal lesion. Sinuses/Orbits: No acute finding. Other: No significant change since previous study. CT CERVICAL SPINE FINDINGS Alignment: Normal alignment of the cervical spine and facet joints. Skull base and vertebrae: Skullbase appears intact. Endplate compression deformities at C7 and T2 without change since prior study. No focal bone lesion or bone destruction. Soft tissues and spinal canal: No prevertebral fluid or swelling. No visible canal hematoma. Disc levels: Disc space heights are preserved. Degenerative changes in the cervical facet joints and at C1 -2. Upper chest: Visualized lung apices are clear. Other: Degenerative changes in the temporomandibular joints. IMPRESSION: No acute intracranial abnormalities. Chronic atrophy and small  vessel ischemic changes. Normal alignment of the cervical spine. Chronic endplate compression deformities at C7 and T2 unchanged since previous study, likely representing osteoporosis. Mild degenerative changes. No acute displaced fractures identified. Electronically Signed   By: Lucienne Capers M.D.   On: 09/13/2016 04:23   Ct Hip Right Wo Contrast  Result Date: 09/13/2016 CLINICAL DATA:  Right hip fracture EXAM: CT OF THE RIGHT HIP WITHOUT CONTRAST TECHNIQUE: Multidetector CT imaging of the right  hip was performed according to the standard protocol. Multiplanar CT image reconstructions were also generated. COMPARISON:  09/13/2016 radiograph FINDINGS: Bones/Joint/Cartilage Intertrochanteric right hip fracture noted with mild comminution along the greater trochanter an along the base of the left femoral neck. The fracture extends into the upper margin of the lesser trochanter. There is likely in the intra-articular portion involving the [place] cervical region. Mild spurring of the right femoral head and acetabulum with confluent subcortical cysts along the posterosuperior acetabulum. Ligaments Suboptimally assessed by CT. Muscles and Tendons Unremarkable Soft tissues Mild external iliac artery atherosclerotic calcification. IMPRESSION: 1. Mildly displaced intertrochanteric fracture with comminuted portion along the greater trochanter and minimal comminution along the basicervical region of the left femoral neck. There could be a small component of intra-articular basicervical involvement. Mild apex anterior angulation of the dominant fracture site as shown on image 31/7. Electronically Signed   By: Van Clines M.D.   On: 09/13/2016 08:16   Dg Femur, Min 2 Views Right  Result Date: 09/13/2016 CLINICAL DATA:  Right hip pain after a fall EXAM: RIGHT FEMUR 2 VIEWS COMPARISON:  None. FINDINGS: Nondisplaced fracture of the greater trochanter of the right hip, possibly with focal extension into the inter  trochanteric region. No dislocation of the right hip. Degenerative changes in the right hip. Femoral shaft appears intact. Previous left knee arthroplasty. Components as visualized appear well-seated. No significant effusion. IMPRESSION: Nondisplaced fracture of the greater trochanter of the right hip with possible focal extension to the inter trochanteric region. Electronically Signed   By: Lucienne Capers M.D.   On: 09/13/2016 04:00    Review of Systems  Unable to perform ROS: Mental acuity  Musculoskeletal: Positive for joint pain (Right hip).   Blood pressure 138/80, pulse 74, temperature 98.3 F (36.8 C), temperature source Oral, resp. rate 20, height _0  (1.676 m), weight 81.6 kg (180 lb), SpO2 99 %. Physical Exam  Constitutional: She appears well-developed and well-nourished. She appears lethargic. No distress.  HENT:  Head: Normocephalic.  Eyes: Conjunctivae are normal. Right eye exhibits no discharge. Left eye exhibits no discharge.  Cardiovascular: Normal rate.   Respiratory: Effort normal. No respiratory distress.  Musculoskeletal:  Right shoulder, elbow, wrist, digits- no skin wounds, nontender, no instability, no blocks to motion  Sens  Ax/R/M/U intact  Mot   Ax/ R/ PIN/ M/ AIN/ U intact  Rad 2+  Left shoulder TTP (chronic), elbow, wrist, digits- no skin wounds, nontender, no instability, no blocks to motion  Sens  Ax/R/M/U intact  Mot   Ax/ R/ PIN/ M/ AIN/ U intact  Rad 2+  RLE No traumatic wounds, ecchymosis, or rash  TTP right hip  No effusions  Knee stability unable to assess  Sens DPN, SPN, TN absent  Motor EHL, ext, flex, evers 5/5  DP 1+, PT 0, No significant edema   LLE No traumatic wounds, ecchymosis, or rash  Nontender  No effusions  Knee stable to varus/ valgus and anterior/posterior stress  Sens DPN, SPN, TN absent  Motor EHL, ext, flex, evers 5/5  DP 1+, PT 0, No significant edema  Lymphadenopathy:    She has no cervical adenopathy.   Neurological: She appears lethargic.  Skin: Skin is warm and dry. She is not diaphoretic.    Assessment/Plan: Fall Right intertroch hip fx -- For IMN today by Dr. Erlinda Hong. Continue NPO and bedrest until after surgery. Multiple medical problems -- per primary service.    Nicole Abu, PA-C Orthopedic Surgery [phone] 09/13/2016,  9:52 AM

## 2016-09-14 DIAGNOSIS — S72001A Fracture of unspecified part of neck of right femur, initial encounter for closed fracture: Secondary | ICD-10-CM

## 2016-09-14 LAB — CBC
HCT: 34.7 % — ABNORMAL LOW (ref 36.0–46.0)
Hemoglobin: 10.6 g/dL — ABNORMAL LOW (ref 12.0–15.0)
MCH: 28.9 pg (ref 26.0–34.0)
MCHC: 30.5 g/dL (ref 30.0–36.0)
MCV: 94.6 fL (ref 78.0–100.0)
Platelets: 145 10*3/uL — ABNORMAL LOW (ref 150–400)
RBC: 3.67 MIL/uL — ABNORMAL LOW (ref 3.87–5.11)
RDW: 14.7 % (ref 11.5–15.5)
WBC: 6.7 10*3/uL (ref 4.0–10.5)

## 2016-09-14 LAB — BASIC METABOLIC PANEL
Anion gap: 7 (ref 5–15)
BUN: 15 mg/dL (ref 6–20)
CO2: 28 mmol/L (ref 22–32)
Calcium: 8.3 mg/dL — ABNORMAL LOW (ref 8.9–10.3)
Chloride: 103 mmol/L (ref 101–111)
Creatinine, Ser: 1.69 mg/dL — ABNORMAL HIGH (ref 0.44–1.00)
GFR calc Af Amer: 33 mL/min — ABNORMAL LOW (ref >60)
GFR calc non Af Amer: 28 mL/min — ABNORMAL LOW (ref >60)
Glucose, Bld: 126 mg/dL — ABNORMAL HIGH (ref 65–99)
Potassium: 3.4 mmol/L — ABNORMAL LOW (ref 3.5–5.1)
Sodium: 138 mmol/L (ref 135–145)

## 2016-09-14 MED ORDER — METHOCARBAMOL 500 MG PO TABS
250.0000 mg | ORAL_TABLET | Freq: Three times a day (TID) | ORAL | Status: DC | PRN
  Administered 2016-09-15 – 2016-09-16 (×3): 250 mg via ORAL
  Filled 2016-09-14 (×3): qty 1

## 2016-09-14 MED ORDER — ACETAMINOPHEN 325 MG PO TABS
650.0000 mg | ORAL_TABLET | Freq: Four times a day (QID) | ORAL | Status: DC | PRN
Start: 2016-09-14 — End: 2016-09-16
  Administered 2016-09-14 – 2016-09-15 (×3): 650 mg via ORAL
  Filled 2016-09-14 (×4): qty 2

## 2016-09-14 MED ORDER — POTASSIUM CHLORIDE CRYS ER 20 MEQ PO TBCR
40.0000 meq | EXTENDED_RELEASE_TABLET | Freq: Four times a day (QID) | ORAL | Status: AC
  Administered 2016-09-14 (×2): 40 meq via ORAL
  Filled 2016-09-14 (×2): qty 2

## 2016-09-14 NOTE — Anesthesia Postprocedure Evaluation (Signed)
Anesthesia Post Note  Patient: [name]  Procedure(s) Performed: Procedure(s) (LRB): INTRAMEDULLARY (IM) NAIL INTERTROCHANTRIC RIGHT (Right)  Patient location during evaluation: PACU Anesthesia Type: General Level of consciousness: awake and alert Pain management: pain level controlled Vital Signs Assessment: post-procedure vital signs reviewed and stable Respiratory status: spontaneous breathing, nonlabored ventilation, respiratory function stable and patient connected to nasal cannula oxygen Cardiovascular status: blood pressure returned to baseline and stable Postop Assessment: no signs of nausea or vomiting Anesthetic complications: no       Last Vitals:  Vitals:   09/14/16 0100 09/14/16 0615  BP: (!) 104/49 (!) 105/49  Pulse: 92 88  Resp:    Temp: 37.3 C 37.4 C    Last Pain:  Vitals:   09/14/16 0615  TempSrc: Oral  PainSc:                  [name]

## 2016-09-14 NOTE — Progress Notes (Signed)
Patient has been very sleepy today. Sat in chair and slept. When checked on she would arouse and then fall back to sleep. No pain medications given during day shift due to drowsiness and lethargic. Patient stated when nurse and nurse tech put her into bed that she had asked several times to be placed back in bed. Patient had not voiced these concerns to the nurse or nurse tech during the day. Patient is now sleeping in bed. Nurse will continue to monitor

## 2016-09-14 NOTE — Evaluation (Signed)
Physical Therapy Evaluation Patient Details Name: [name] MRN: [id number] DOB: 06/29/1940 Today's Date: 09/14/2016   History of Present Illness  Pt is a 76 y.o. female with medical history significant of anemia, , COPD, degenerative disc disease of lumbar spine, C-spine fracture, hypertension, hyperlipidemia, GERD, depression, macular degeneration, and multiple strokes. She presented to the emergency department via EMS after she sustained a fall at home hitting head and right hip.  Xray revealed R hip fx. She underwent IM nailing 02-13-17.  Clinical Impression  Pt admitted with above diagnosis. Pt currently with functional limitations due to the deficits listed below (see PT Problem List). On eval, pt required max assist bed mobility and max assist transfers. Pt unable to progress with ambulation due to pain. Pt with lethargy and confusion on eval, probably due to pain meds.  Pt will benefit from skilled PT to increase their independence and safety with mobility to allow discharge to the venue listed below.       Follow Up Recommendations SNF;Supervision/Assistance - 24 hour    Equipment Recommendations  None recommended by PT    Recommendations for Other Services       Precautions / Restrictions Precautions Precautions: Fall      Mobility  Bed Mobility Overal bed mobility: Needs Assistance Bed Mobility: Supine to Sit     Supine to sit: Max assist;HOB elevated     General bed mobility comments: +rail, increased time, continuous verbal cues for sequencing  Transfers Overall transfer level: Needs assistance   Transfers: Sit to/from Stand;Squat Pivot Transfers Sit to Stand: Max assist   Squat pivot transfers: Max assist     General transfer comment: RW used for sit to stand from EOB. Pt unable to take pivot steps. Returned to sitting then performed squat-pivot transfer without AD.  Ambulation/Gait             General Gait Details: unable  Stairs            Wheelchair Mobility    Modified Rankin (Stroke Patients Only)       Balance Overall balance assessment: Needs assistance Sitting-balance support: Feet supported;Single extremity supported Sitting balance-Leahy Scale: Fair     Standing balance support: Bilateral upper extremity supported;During functional activity Standing balance-Leahy Scale: Zero                               Pertinent Vitals/Pain      Home Living Family/patient expects to be discharged to:: Private residence Living Arrangements: Children Available Help at Discharge: Family;Available 24 hours/day Type of Home: Mobile home Home Access: Stairs to enter Entrance Stairs-Rails: Right;Left Entrance Stairs-Number of Steps: 3 Home Layout: One level Home Equipment: Walker - 2 wheels;Cane - single point      Prior Function Level of Independence: Independent               Hand Dominance        Extremity/Trunk Assessment                Communication   Communication: No difficulties  Cognition Arousal/Alertness: Lethargic;Suspect due to medications Behavior During Therapy: Anxious Overall Cognitive Status: Impaired/Different from baseline Area of Impairment: Orientation;Safety/judgement;Following commands;Problem solving;Attention;Memory                 Orientation Level: Disoriented to;Situation Current Attention Level: Sustained Memory: Decreased recall of precautions;Decreased short-term memory Following Commands: Follows one step commands with increased time Safety/Judgement: Decreased awareness  of safety   Problem Solving: Slow processing;Decreased initiation;Difficulty sequencing;Requires verbal cues;Requires tactile cues        General Comments      Exercises     Assessment/Plan    PT Assessment Patient needs continued PT services  PT Problem List Decreased strength;Decreased activity tolerance;Decreased balance;Decreased knowledge of use of  DME;Decreased cognition;Decreased mobility;Decreased safety awareness;Decreased knowledge of precautions;Pain       PT Treatment Interventions DME instruction;Gait training;Functional mobility training;Balance training;Therapeutic exercise;Therapeutic activities;Cognitive remediation;Patient/family education    PT Goals (Current goals can be found in the Care Plan section)  Acute Rehab PT Goals Patient Stated Goal: not stated PT Goal Formulation: With patient/family Time For Goal Achievement: 09/28/16 Potential to Achieve Goals: Good    Frequency Min 3X/week   Barriers to discharge        Co-evaluation               End of Session Equipment Utilized During Treatment: Gait belt Activity Tolerance: Patient limited by lethargy;Patient limited by pain Patient left: in chair;with call bell/phone within reach;with family/visitor present Nurse Communication: Mobility status PT Visit Diagnosis: Pain;Difficulty in walking, not elsewhere classified (R26.2) Pain - Right/Left: Right Pain - part of body: Hip    Time: 2703-5009 PT Time Calculation (min) (ACUTE ONLY): 34 min   Charges:   PT Evaluation $PT Eval Moderate Complexity: 1 Procedure PT Treatments $Therapeutic Activity: 8-22 mins   PT G Codes:        Lorrin Goodell, PT  Office # [phone] Pager [phone]   Lorriane Shire 09/14/2016, 2:33 PM

## 2016-09-14 NOTE — Evaluation (Signed)
Occupational Therapy Evaluation Patient Details Name: [name] MRN: [id number] DOB: 30-Jul-1940 Today's Date: 09/14/2016    History of Present Illness Pt is a 76 y.o. female with medical history significant of anemia, , COPD, degenerative disc disease of lumbar spine, C-spine fracture, hypertension, hyperlipidemia, GERD, depression, macular degeneration, and multiple strokes. She presented to the emergency department via EMS after she sustained a fall at home hitting head and right hip.  Xray revealed R hip fx. She underwent IM nailing 02-13-17.   Clinical Impression   PTA Pt independent in ADL, and used a RW for mobility. Pt currently max assist for ADL and +2 max for stand pivot transfers.  Pt currently with functional limitations due to the deficits listed below (see OT Problem List). Pt with lethargy and confusion on eval, likely due to pain meds.  Pt will benefit from skilled OT to increase their independence and safety with ADL and functional transfers to allow discharge to the venue listed below.    Follow Up Recommendations  SNF;Supervision/Assistance - 24 hour    Equipment Recommendations  Other (comment) (defer to next venue)    Recommendations for Other Services       Precautions / Restrictions Precautions Precautions: Fall Restrictions Weight Bearing Restrictions: Yes RLE Weight Bearing: Weight bearing as tolerated      Mobility Bed Mobility Overal bed mobility: Needs Assistance Bed Mobility: Sit to Supine     Supine to sit: Max assist;HOB elevated Sit to supine: Max assist;+2 for physical assistance   General bed mobility comments: vc for sequencing, support for trunk and max A for BLE back into bed  Transfers Overall transfer level: Needs assistance   Transfers: Sit to/from Stand;Stand Pivot Transfers Sit to Stand: Max assist;+2 physical assistance Stand pivot transfers: Max assist;+2 physical assistance Squat pivot transfers: Max assist      General transfer comment: NT and RN present and assisting    Balance Overall balance assessment: Needs assistance Sitting-balance support: Feet supported;Single extremity supported Sitting balance-Leahy Scale: Fair     Standing balance support: Bilateral upper extremity supported;During functional activity Standing balance-Leahy Scale: Zero                             ADL either performed or assessed with clinical judgement   ADL Overall ADL's : Needs assistance/impaired Eating/Feeding: Set up;Sitting   Grooming: Set up;Sitting   Upper Body Bathing: Set up;Sitting   Lower Body Bathing: Total assistance;Bed level   Upper Body Dressing : Set up;Sitting   Lower Body Dressing: Total assistance;+2 for physical assistance;Sit to/from stand   Toilet Transfer: Maximal assistance;+2 for physical assistance;+2 for safety/equipment;Stand-pivot;BSC Toilet Transfer Details (indicate cue type and reason): Pt does better when transferring to the left as she can use her strong side Toileting- Clothing Manipulation and Hygiene: Total assistance;Sit to/from stand;+2 for physical assistance;+2 for safety/equipment Toileting - Clothing Manipulation Details (indicate cue type and reason): +2 to stand and third person provided assist for peri care     Functional mobility during ADLs:  (stand pivot only this session) General ADL Comments: Pt very confused, crying out in pain, but the second she was back in bed she was asleep     Vision Patient Visual Report: No change from baseline       Perception     Praxis      Pertinent Vitals/Pain Pain Assessment: 0-10 Pain Score: 10-Worst pain ever (during movement) Pain Location: RLE Pain Descriptors /  Indicators: Moaning;Grimacing;Guarding;Operative site guarding;Crying Pain Intervention(s): Limited activity within patient's tolerance;Monitored during session;Repositioned;Ice applied     Hand Dominance Right   Extremity/Trunk  Assessment Upper Extremity Assessment Upper Extremity Assessment: Generalized weakness   Lower Extremity Assessment Lower Extremity Assessment: RLE deficits/detail RLE Deficits / Details: expected post op deficits in ROM and strength RLE: Unable to fully assess due to pain RLE Coordination: decreased gross motor   Cervical / Trunk Assessment Cervical / Trunk Assessment: Other exceptions Cervical / Trunk Exceptions: rounded shoulders and forward head   Communication Communication Communication: No difficulties   Cognition Arousal/Alertness: Lethargic;Suspect due to medications Behavior During Therapy: Anxious Overall Cognitive Status: Impaired/Different from baseline Area of Impairment: Orientation;Safety/judgement;Following commands;Problem solving;Attention;Memory                 Orientation Level: Disoriented to;Situation Current Attention Level: Sustained Memory: Decreased recall of precautions;Decreased short-term memory Following Commands: Follows one step commands with increased time Safety/Judgement: Decreased awareness of safety   Problem Solving: Slow processing;Decreased initiation;Difficulty sequencing;Requires verbal cues;Requires tactile cues     General Comments       Exercises     Shoulder Instructions      Home Living Family/patient expects to be discharged to:: Private residence Living Arrangements: Children Available Help at Discharge: Family;Available 24 hours/day Type of Home: Mobile home Home Access: Stairs to enter Entrance Stairs-Number of Steps: 3 Entrance Stairs-Rails: Right;Left Home Layout: One level     Bathroom Shower/Tub: Teacher, early years/pre: Standard     Home Equipment: Environmental consultant - 2 wheels;Cane - single point          Prior Functioning/Environment Level of Independence: Independent                 OT Problem List: Decreased strength;Decreased range of motion;Decreased activity tolerance;Impaired  balance (sitting and/or standing);Decreased cognition;Decreased safety awareness;Decreased knowledge of use of DME or AE;Decreased knowledge of precautions;Pain      OT Treatment/Interventions: Self-care/ADL training;Therapeutic exercise;Energy conservation;DME and/or AE instruction;Therapeutic activities;Patient/family education;Balance training    OT Goals(Current goals can be found in the care plan section) Acute Rehab OT Goals Patient Stated Goal: not stated OT Goal Formulation: Patient unable to participate in goal setting Potential to Achieve Goals: Fair ADL Goals Pt Will Transfer to Toilet: with mod assist;bedside commode (with RW) Pt Will Perform Toileting - Clothing Manipulation and hygiene: with mod assist;sit to/from stand;with caregiver independent in assisting Additional ADL Goal #1: Pt will perform bed mobility at min A level as precusor to ADL.  OT Frequency: Min 2X/week   Barriers to D/C:            Co-evaluation              End of Session Equipment Utilized During Treatment: Gait belt Nurse Communication: Mobility status;Precautions  Activity Tolerance: Patient limited by pain Patient left: in bed;with call bell/phone within reach;with bed alarm set;with nursing/sitter in room  OT Visit Diagnosis: Unsteadiness on feet (R26.81);Muscle weakness (generalized) (M62.81);History of falling (Z91.81);Other symptoms and signs involving cognitive function;Pain Pain - Right/Left: Right Pain - part of body: Hip;Leg                Time: 6213-0865 OT Time Calculation (min): 14 min Charges:  OT General Charges $OT Visit: 1 Procedure OT Evaluation $OT Eval Moderate Complexity: 1 Procedure G-Codes:     Hulda Humphrey OTR/L [place] 09/14/2016, 5:34 PM

## 2016-09-14 NOTE — Progress Notes (Signed)
PROGRESS NOTE                                                                                                                                                                                                             Patient Demographics:    [name], is a 76 y.o. female, DOB - 03/25/1941, UDJ:[id number]  Admit date - 09/13/2016   Admitting Physician Sid Falcon, MD  Outpatient Primary MD for the patient is Wenda Low, MD  LOS - 1  Chief Complaint  Patient presents with  . Fall       Brief Narrative  76 year old with a history of degenerative disc disease, hypertension, hyperlipidemia, multiple strokes. Patient brought to the hospital via EMS after falling and was found to have R hip fracture.   Subjective:    [name] today has, No headache, No chest pain, No abdominal pain - No Nausea, No new weakness tingling or numbness, No Cough - SOB.Mild R hip pain.   Assessment  & Plan :     1. Mech.Fall with R. Intertrochanteric fracture - status post treatment of R. intertrochanteric fracture with intramedullary implant - 09-13-16, by Dr Erlinda Hong. Lovenox for DVT prophylaxis, WBAT, PT eval, minimal perioperative blood loss related anemia.  2. HTN - on norvasc  3.GERD - on PPI  4.Dyslipidemia - on statin.  5.H/O CVA - supportive rx.  6.Anxiety and depression - home meds continued.  7.Hypokalemia - replaced.   Diet : Diet Heart Room service appropriate? Yes; Fluid consistency: Thin    Family Communication  :  daughter  Code Status :  Full  Disposition Plan  :  TBD  Consults  :  Ortho  Procedures  :    CT head & Neck - Non acute  Treatment of R. intertrochanteric fracture with intramedullary implant - 09-13-16, Dr Erlinda Hong  DVT Prophylaxis  :  Lovenox   Lab Results  Component Value Date   PLT 145 (L) 09/14/2016    Inpatient Medications  Scheduled Meds: . amLODipine  5 mg Oral Daily  .  bisacodyl  10 mg Oral Daily  . busPIRone  5 mg Oral BID  . citalopram  30 mg Oral Daily  . enoxaparin (LOVENOX) injection  40 mg Subcutaneous Q24H  . [START ON 09/16/2016] ferrous sulfate  325 mg Oral Once per day on Mon  Thu  . gabapentin  400 mg Oral QID  . naloxegol oxalate  25 mg Oral Daily  . pantoprazole  80 mg Oral Q1200  . polyethylene glycol  17 g Oral Daily  . potassium chloride  40 mEq Oral Q6H  . pravastatin  10 mg Oral q1800  . traZODone  25 mg Oral QHS  . vitamin B-12  2,000 mcg Oral Daily   Continuous Infusions: PRN Meds:.albuterol, ALPRAZolam, alum & mag hydroxide-simeth, diphenhydrAMINE, methocarbamol, morphine injection, [DISCONTINUED] ondansetron **OR** ondansetron (ZOFRAN) IV  Antibiotics  :    Anti-infectives    Start     Dose/Rate Route Frequency Ordered Stop   09/13/16 2130  ceFAZolin (ANCEF) IVPB 2g/100 mL premix     2 g 200 mL/hr over 30 Minutes Intravenous Every 6 hours 09/13/16 1812 09/14/16 0901   09/13/16 1145  clindamycin (CLEOCIN) IVPB 900 mg  Status:  Discontinued     900 mg 100 mL/hr over 30 Minutes Intravenous On call to O.R. 09/13/16 1140 09/13/16 1759   09/13/16 1145  ceFAZolin (ANCEF) IVPB 2g/100 mL premix     2 g 200 mL/hr over 30 Minutes Intravenous On call to O.R. 09/13/16 1140 09/13/16 1530         Objective:   Vitals:   09/13/16 1750 09/13/16 1807 09/14/16 0100 09/14/16 0615  BP:  (!) 149/65 (!) 104/49 (!) 105/49  Pulse:  100 92 88  Resp:  18    Temp: 98.5 F (36.9 C) 98.3 F (36.8 C) 99.2 F (37.3 C) 99.3 F (37.4 C)  TempSrc:  Oral Axillary Oral  SpO2:  98% 96% 95%  Weight:      Height:        Wt Readings from Last 3 Encounters:  09/13/16 81.6 kg (180 lb)  04/15/16 81.2 kg (179 lb)  03/26/16 82.6 kg (182 lb)     Intake/Output Summary (Last 24 hours) at 09/14/16 0930 Last data filed at 09/13/16 1753  Gross per 24 hour  Intake              700 ml  Output              700 ml  Net                0 ml      Physical Exam  Awake Alert, No new F.N deficits, Normal affect [place].AT,PERRAL Supple Neck,No JVD, No cervical lymphadenopathy appriciated.  Symmetrical Chest wall movement, Good air movement bilaterally, CTAB RRR,No Gallops,Rubs or new Murmurs, No Parasternal Heave +ve B.Sounds, Abd Soft, No tenderness, No organomegaly appriciated, No rebound - guarding or rigidity. No Cyanosis, Clubbing or edema, No new Rash or bruise  R hip post op site looks stable    Data Review:    CBC  Recent Labs Lab 09/13/16 0314 09/14/16 0543  WBC 6.1 6.7  HGB 14.1 10.6*  HCT 43.7 34.7*  PLT 175 145*  MCV 91.6 94.6  MCH 29.6 28.9  MCHC 32.3 30.5  RDW 14.1 14.7  LYMPHSABS 2.0  --   MONOABS 0.5  --   EOSABS 0.3  --   BASOSABS 0.0  --     Chemistries   Recent Labs Lab 09/13/16 0314 09/14/16 0543  NA 143 138  K 3.3* 3.4*  CL 104 103  CO2 26 28  GLUCOSE 113* 126*  BUN 7 15  CREATININE 1.02* 1.69*  CALCIUM 9.5 8.3*   ------------------------------------------------------------------------------------------------------------------ No results for input(s): CHOL, HDL, LDLCALC, TRIG, CHOLHDL, LDLDIRECT  in the last 72 hours.  Lab Results  Component Value Date   HGBA1C 5.6 05/22/2015   ------------------------------------------------------------------------------------------------------------------ No results for input(s): TSH, T4TOTAL, T3FREE, THYROIDAB in the last 72 hours.  Invalid input(s): FREET3 ------------------------------------------------------------------------------------------------------------------ No results for input(s): VITAMINB12, FOLATE, FERRITIN, TIBC, IRON, RETICCTPCT in the last 72 hours.  Coagulation profile  Recent Labs Lab 09/13/16 0314  INR 1.00    No results for input(s): DDIMER in the last 72 hours.  Cardiac Enzymes No results for input(s): CKMB, TROPONINI, MYOGLOBIN in the last 168 hours.  Invalid input(s):  CK ------------------------------------------------------------------------------------------------------------------    Component Value Date/Time   BNP 60.2 01/23/2016 2019    Micro Results Recent Results (from the past 240 hour(s))  Surgical pcr screen     Status: None   Collection Time: 09/13/16 11:41 AM  Result Value Ref Range Status   MRSA, PCR NEGATIVE NEGATIVE Final   Staphylococcus aureus NEGATIVE NEGATIVE Final    Comment:        The Xpert SA Assay (FDA approved for NASAL specimens in patients over 56 years of age), is one component of a comprehensive surveillance program.  Test performance has been validated by [facility] for patients greater than or equal to 11 year old. It is not intended to diagnose infection nor to guide or monitor treatment.     Radiology Reports Dg Chest 1 View  Result Date: 09/13/2016 CLINICAL DATA:  Fall. Dizziness leading to fall walking to the restroom. EXAM: CHEST 1 VIEW COMPARISON:  Radiograph 06/17/2016 FINDINGS: Stable borderline mild cardiomegaly. Stable tortuosity of the thoracic aorta. No pulmonary edema. No evidence of pneumothorax. No confluent airspace disease or pleural fluid. No acute osseous abnormalities are seen. Calcified granuloma again seen. No acute osseous abnormalities. IMPRESSION: No acute abnormality. Electronically Signed   By: Jeb Levering M.D.   On: 09/13/2016 04:00   Dg Pelvis 1-2 Views  Result Date: 09/13/2016 CLINICAL DATA:  Patient became dizzy and fell, striking head and right hip. Severe right hip pain. EXAM: PELVIS - 1-2 VIEW COMPARISON:  09/17/2012 FINDINGS: Cortical irregularity along the greater trochanter of the right hip probably represents nondisplaced fracture. No dislocation of the right hip. Diffuse bone demineralization. Pelvis appears intact. SI joints and symphysis pubis are not displaced. IMPRESSION: Cortical irregularity along the greater trochanter of the right hip probably represents  nondisplaced fracture. Electronically Signed   By: Lucienne Capers M.D.   On: 09/13/2016 03:59   Ct Head Wo Contrast  Result Date: 09/13/2016 CLINICAL DATA:  Fall. EXAM: CT HEAD WITHOUT CONTRAST CT CERVICAL SPINE WITHOUT CONTRAST TECHNIQUE: Multidetector CT imaging of the head and cervical spine was performed following the standard protocol without intravenous contrast. Multiplanar CT image reconstructions of the cervical spine were also generated. COMPARISON:  CT head 07/02/2016. CT head and cervical spine 03/26/2016 FINDINGS: CT HEAD FINDINGS Brain: No evidence of acute infarction, hemorrhage, hydrocephalus, extra-axial collection or mass lesion/mass effect. Diffuse cerebral atrophy. Ventricular dilatation consistent with central atrophy. Low-attenuation changes in the deep white matter consistent small vessel ischemia. Calcification in the basal ganglia. Vascular: No hyperdense vessel or unexpected calcification. Skull: Normal. Negative for fracture or focal lesion. Sinuses/Orbits: No acute finding. Other: No significant change since previous study. CT CERVICAL SPINE FINDINGS Alignment: Normal alignment of the cervical spine and facet joints. Skull base and vertebrae: Skullbase appears intact. Endplate compression deformities at C7 and T2 without change since prior study. No focal bone lesion or bone destruction. Soft tissues and spinal canal: No prevertebral fluid  or swelling. No visible canal hematoma. Disc levels: Disc space heights are preserved. Degenerative changes in the cervical facet joints and at C1 -2. Upper chest: Visualized lung apices are clear. Other: Degenerative changes in the temporomandibular joints. IMPRESSION: No acute intracranial abnormalities. Chronic atrophy and small vessel ischemic changes. Normal alignment of the cervical spine. Chronic endplate compression deformities at C7 and T2 unchanged since previous study, likely representing osteoporosis. Mild degenerative changes. No  acute displaced fractures identified. Electronically Signed   By: Lucienne Capers M.D.   On: 09/13/2016 04:23   Ct Cervical Spine Wo Contrast  Result Date: 09/13/2016 CLINICAL DATA:  Fall. EXAM: CT HEAD WITHOUT CONTRAST CT CERVICAL SPINE WITHOUT CONTRAST TECHNIQUE: Multidetector CT imaging of the head and cervical spine was performed following the standard protocol without intravenous contrast. Multiplanar CT image reconstructions of the cervical spine were also generated. COMPARISON:  CT head 07/02/2016. CT head and cervical spine 03/26/2016 FINDINGS: CT HEAD FINDINGS Brain: No evidence of acute infarction, hemorrhage, hydrocephalus, extra-axial collection or mass lesion/mass effect. Diffuse cerebral atrophy. Ventricular dilatation consistent with central atrophy. Low-attenuation changes in the deep white matter consistent small vessel ischemia. Calcification in the basal ganglia. Vascular: No hyperdense vessel or unexpected calcification. Skull: Normal. Negative for fracture or focal lesion. Sinuses/Orbits: No acute finding. Other: No significant change since previous study. CT CERVICAL SPINE FINDINGS Alignment: Normal alignment of the cervical spine and facet joints. Skull base and vertebrae: Skullbase appears intact. Endplate compression deformities at C7 and T2 without change since prior study. No focal bone lesion or bone destruction. Soft tissues and spinal canal: No prevertebral fluid or swelling. No visible canal hematoma. Disc levels: Disc space heights are preserved. Degenerative changes in the cervical facet joints and at C1 -2. Upper chest: Visualized lung apices are clear. Other: Degenerative changes in the temporomandibular joints. IMPRESSION: No acute intracranial abnormalities. Chronic atrophy and small vessel ischemic changes. Normal alignment of the cervical spine. Chronic endplate compression deformities at C7 and T2 unchanged since previous study, likely representing osteoporosis. Mild  degenerative changes. No acute displaced fractures identified. Electronically Signed   By: Lucienne Capers M.D.   On: 09/13/2016 04:23   Ct Hip Right Wo Contrast  Result Date: 09/13/2016 CLINICAL DATA:  Right hip fracture EXAM: CT OF THE RIGHT HIP WITHOUT CONTRAST TECHNIQUE: Multidetector CT imaging of the right hip was performed according to the standard protocol. Multiplanar CT image reconstructions were also generated. COMPARISON:  09/13/2016 radiograph FINDINGS: Bones/Joint/Cartilage Intertrochanteric right hip fracture noted with mild comminution along the greater trochanter an along the base of the left femoral neck. The fracture extends into the upper margin of the lesser trochanter. There is likely in the intra-articular portion involving the [place] cervical region. Mild spurring of the right femoral head and acetabulum with confluent subcortical cysts along the posterosuperior acetabulum. Ligaments Suboptimally assessed by CT. Muscles and Tendons Unremarkable Soft tissues Mild external iliac artery atherosclerotic calcification. IMPRESSION: 1. Mildly displaced intertrochanteric fracture with comminuted portion along the greater trochanter and minimal comminution along the basicervical region of the left femoral neck. There could be a small component of intra-articular basicervical involvement. Mild apex anterior angulation of the dominant fracture site as shown on image 31/7. Electronically Signed   By: Van Clines M.D.   On: 09/13/2016 08:16   Mr Knee Left Wo Contrast  Result Date: 09/04/2016 CLINICAL DATA:  Feels like the knee is popping out.  Pain. EXAM: MRI OF THE LEFT KNEE WITHOUT CONTRAST TECHNIQUE: Multiplanar, multisequence MR  imaging of the knee was performed. No intravenous contrast was administered. COMPARISON:  None. 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 No obvious  periarticular fluid collection. No osteolysis. No acute fracture or dislocation. Overall alignment is anatomic. No Baker cyst. Ligaments Collateral ligaments are obscured by susceptibility artifact. Muscles and Tendons No muscle signal abnormality. No muscle atrophy or edema. No intramuscular fluid collection or hematoma. Patellar tendon and quadriceps tendon are intact. Soft tissue No fluid collection or hematoma. No soft tissue mass. Popliteal fossa is normal. Normal neurovascular bundles. IMPRESSION: 1. 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 No evidence of hardware failure or complication. 2. No Baker cyst. Electronically Signed   By: Kathreen Devoid   On: 09/04/2016 13:51   Dg C-arm 1-60 Min  Result Date: 09/13/2016 CLINICAL DATA:  Intraoperative fluoroscopic imaging for fixation of a right hip fracture suffered in a fall today. EXAM: DG C-ARM 61-120 MIN; RIGHT FEMUR 2 VIEWS COMPARISON:  Plain films and CT scan right hip this same day. FINDINGS: Three fluoroscopic intraoperative spot views of the right hip are provided. Images demonstrate a short intramedullary nail and 2 proximal screws in place across the intertrochanteric fracture. Position and alignment appear anatomic. No acute abnormality. IMPRESSION: ORIF right intertrochanteric fracture.  No acute finding. Electronically Signed   By: Inge Rise M.D.   On: 09/13/2016 17:03   Dg Femur, Min 2 Views Right  Result Date: 09/13/2016 CLINICAL DATA:  Intraoperative fluoroscopic imaging for fixation of a right hip fracture suffered in a fall today. EXAM: DG C-ARM 61-120 MIN; RIGHT FEMUR 2 VIEWS COMPARISON:  Plain films and CT scan right hip this same day. FINDINGS: Three fluoroscopic intraoperative spot views of the right hip are provided. Images demonstrate a short intramedullary nail and 2 proximal screws in place across the intertrochanteric fracture. Position and  alignment appear anatomic. No acute abnormality. IMPRESSION: ORIF right intertrochanteric fracture.  No acute finding. Electronically Signed   By: Inge Rise M.D.   On: 09/13/2016 17:03   Dg Femur, Min 2 Views Right  Result Date: 09/13/2016 CLINICAL DATA:  Right hip pain after a fall EXAM: RIGHT FEMUR 2 VIEWS COMPARISON:  None. FINDINGS: Nondisplaced fracture of the greater trochanter of the right hip, possibly with focal extension into the inter trochanteric region. No dislocation of the right hip. Degenerative changes in the right hip. Femoral shaft appears intact. Previous left knee arthroplasty. Components as visualized appear well-seated. No significant effusion. IMPRESSION: Nondisplaced fracture of the greater trochanter of the right hip with possible focal extension to the inter trochanteric region. Electronically Signed   By: Lucienne Capers M.D.   On: 09/13/2016 04:00    Time Spent in minutes  30   Lala Lund M.D on 09/14/2016 at 9:30 AM  Between 7am to 7pm - Pager - [phone] ( page via [facility], text pages only, please mention full 10 digit call back number). After 7pm go to [email or web address] - password [facility]

## 2016-09-14 NOTE — Progress Notes (Signed)
Subjective: 1 Day Post-Op Procedure(s) (LRB): INTRAMEDULLARY (IM) NAIL INTERTROCHANTRIC RIGHT (Right) Patient reports pain as mild and moderate.    Objective: Vital signs in last 24 hours: Temp:  [98.3 F (36.8 C)-99.4 F (37.4 C)] 99.3 F (37.4 C) (04/21 0615) Pulse Rate:  [88-144] 88 (04/21 0615) Resp:  [16-31] 18 (04/20 1807) BP: (104-164)/(49-83) 105/49 (04/21 0615) SpO2:  [88 %-98 %] 95 % (04/21 0615)  Intake/Output from previous day: 04/20 0701 - 04/21 0700 In: 700 [I.V.:700] Out: 700 [Urine:600; Blood:100] Intake/Output this shift: Total I/O In: 240 [P.O.:240] Out: 200 [Urine:200]   Recent Labs  09/13/16 0314 09/14/16 0543  HGB 14.1 10.6*    Recent Labs  09/13/16 0314 09/14/16 0543  WBC 6.1 6.7  RBC 4.77 3.67*  HCT 43.7 34.7*  PLT 175 145*    Recent Labs  09/13/16 0314 09/14/16 0543  NA 143 138  K 3.3* 3.4*  CL 104 103  CO2 26 28  BUN 7 15  CREATININE 1.02* 1.69*  GLUCOSE 113* 126*  CALCIUM 9.5 8.3*    Recent Labs  09/13/16 0314  INR 1.00    Neurovascular intact Sensation intact distally Dorsiflexion/Plantar flexion intact Incision: dressing C/D/I  Assessment/Plan: 1 Day Post-Op Procedure(s) (LRB): INTRAMEDULLARY (IM) NAIL INTERTROCHANTRIC RIGHT (Right) Advance diet Up with therapy  Biagio Borg 09/14/2016, 10:56 AM

## 2016-09-15 LAB — CBC
HCT: 33.3 % — ABNORMAL LOW (ref 36.0–46.0)
Hemoglobin: 10.5 g/dL — ABNORMAL LOW (ref 12.0–15.0)
MCH: 29.8 pg (ref 26.0–34.0)
MCHC: 31.5 g/dL (ref 30.0–36.0)
MCV: 94.6 fL (ref 78.0–100.0)
Platelets: 120 10*3/uL — ABNORMAL LOW (ref 150–400)
RBC: 3.52 MIL/uL — ABNORMAL LOW (ref 3.87–5.11)
RDW: 14.8 % (ref 11.5–15.5)
WBC: 7.2 10*3/uL (ref 4.0–10.5)

## 2016-09-15 LAB — BASIC METABOLIC PANEL
Anion gap: 6 (ref 5–15)
BUN: 14 mg/dL (ref 6–20)
CO2: 26 mmol/L (ref 22–32)
Calcium: 8.8 mg/dL — ABNORMAL LOW (ref 8.9–10.3)
Chloride: 104 mmol/L (ref 101–111)
Creatinine, Ser: 1.19 mg/dL — ABNORMAL HIGH (ref 0.44–1.00)
GFR calc Af Amer: 50 mL/min — ABNORMAL LOW (ref >60)
GFR calc non Af Amer: 44 mL/min — ABNORMAL LOW (ref >60)
Glucose, Bld: 138 mg/dL — ABNORMAL HIGH (ref 65–99)
Potassium: 4.6 mmol/L (ref 3.5–5.1)
Sodium: 136 mmol/L (ref 135–145)

## 2016-09-15 MED ORDER — TAMSULOSIN HCL 0.4 MG PO CAPS
0.4000 mg | ORAL_CAPSULE | Freq: Every day | ORAL | Status: DC
  Administered 2016-09-15 – 2016-09-16 (×2): 0.4 mg via ORAL
  Filled 2016-09-15 (×2): qty 1

## 2016-09-15 MED ORDER — HYDROCODONE-ACETAMINOPHEN 7.5-325 MG PO TABS: 1.0000 | ORAL_TABLET | Freq: Three times a day (TID) | ORAL | 0 refills | Status: DC

## 2016-09-15 MED ORDER — ALPRAZOLAM 1 MG PO TABS: 1.0000 mg | ORAL_TABLET | Freq: Every day | ORAL | 0 refills | Status: DC

## 2016-09-15 NOTE — Progress Notes (Signed)
Patient would like something PO for pain. Nurse paged MD Candiss Norse twice. Patient states that tylenol will not touch her pain. Nurse will continue to monitor

## 2016-09-15 NOTE — Progress Notes (Signed)
PROGRESS NOTE                                                                                                                                                                                                             Patient Demographics:    [name], is a 76 y.o. female, DOB - 04/30/1941, MEQ:[id number]  Admit date - 09/13/2016   Admitting Physician Sid Falcon, MD  Outpatient Primary MD for the patient is Wenda Low, MD  LOS - 2  Chief Complaint  Patient presents with  . Fall       Brief Narrative  76 year old with a history of degenerative disc disease, hypertension, hyperlipidemia, multiple strokes. Patient brought to the hospital via EMS after falling and was found to have R hip fracture.   Subjective:    [name] today has, No headache, No chest pain, No abdominal pain - No Nausea, No new weakness tingling or numbness, No Cough - SOB.Mild R hip pain.   Assessment  & Plan :     1. Mech.Fall with R. Intertrochanteric fracture - status post treatment of R. intertrochanteric fracture with intramedullary implant - 09-13-16, by Dr Erlinda Hong. Lovenox for DVT prophylaxis, WBAT, PT eval, minimal perioperative blood loss related anemia. She is now agreeable to going to SNF will look for a bed.  2. HTN - on norvasc  3. GERD - on PPI  4. Dyslipidemia - on statin.  5. H/O CVA - supportive rx.  6. Anxiety and depression - home meds continued.  7. Hypokalemia - replaced and stable.  8.Urinary retention. Likely due to combination of lack of activity, narcotics. Flomax and Foley, try to remove after 7-10 days in SNF once she is more active.  9. ARF resolved after hydration.   Diet : Diet Heart Room service appropriate? Yes; Fluid consistency: Thin    Family Communication  :  daughter  Code Status :  Full  Disposition Plan  :  SNF in am  Consults  :  Ortho  Procedures  :    CT head & Neck - Non  acute  Treatment of R. intertrochanteric fracture with intramedullary implant - 09-13-16, Dr Erlinda Hong  DVT Prophylaxis  :  Lovenox   Lab Results  Component Value Date   PLT 120 (L) 09/15/2016    Inpatient Medications  Scheduled Meds: .  amLODipine  5 mg Oral Daily  . bisacodyl  10 mg Oral Daily  . busPIRone  5 mg Oral BID  . citalopram  30 mg Oral Daily  . enoxaparin (LOVENOX) injection  40 mg Subcutaneous Q24H  . [START ON 09/16/2016] ferrous sulfate  325 mg Oral Once per day on Mon Thu  . gabapentin  400 mg Oral QID  . naloxegol oxalate  25 mg Oral Daily  . pantoprazole  80 mg Oral Q1200  . polyethylene glycol  17 g Oral Daily  . pravastatin  10 mg Oral q1800  . traZODone  25 mg Oral QHS  . vitamin B-12  2,000 mcg Oral Daily   Continuous Infusions: PRN Meds:.acetaminophen, albuterol, ALPRAZolam, alum & mag hydroxide-simeth, diphenhydrAMINE, methocarbamol, morphine injection, [DISCONTINUED] ondansetron **OR** ondansetron (ZOFRAN) IV  Antibiotics  :    Anti-infectives    Start     Dose/Rate Route Frequency Ordered Stop   09/13/16 2130  ceFAZolin (ANCEF) IVPB 2g/100 mL premix     2 g 200 mL/hr over 30 Minutes Intravenous Every 6 hours 09/13/16 1812 09/14/16 0901   09/13/16 1145  clindamycin (CLEOCIN) IVPB 900 mg  Status:  Discontinued     900 mg 100 mL/hr over 30 Minutes Intravenous On call to O.R. 09/13/16 1140 09/13/16 1759   09/13/16 1145  ceFAZolin (ANCEF) IVPB 2g/100 mL premix     2 g 200 mL/hr over 30 Minutes Intravenous On call to O.R. 09/13/16 1140 09/13/16 1530         Objective:   Vitals:   09/14/16 1558 09/14/16 2143 09/15/16 0000 09/15/16 0355  BP: (!) 126/56 129/64  130/65  Pulse: 88 80  73  Resp: 18     Temp: 100.1 F (37.8 C) (!) 100.5 F (38.1 C) 98.9 F (37.2 C) 98.5 F (36.9 C)  TempSrc: Oral Oral Oral Oral  SpO2: 97% 97%  100%  Weight:      Height:        Wt Readings from Last 3 Encounters:  09/13/16 81.6 kg (180 lb)  04/15/16 81.2 kg (179  lb)  03/26/16 82.6 kg (182 lb)     Intake/Output Summary (Last 24 hours) at 09/15/16 1110 Last data filed at 09/15/16 0940  Gross per 24 hour  Intake              720 ml  Output              650 ml  Net               70 ml     Physical Exam  Awake Alert, Oriented X 3, No new F.N deficits, Normal affect [name].AT,PERRAL Supple Neck,No JVD, No cervical lymphadenopathy appriciated.  Symmetrical Chest wall movement, Good air movement bilaterally, CTAB RRR,No Gallops,Rubs or new Murmurs, No Parasternal Heave +ve B.Sounds, Abd Soft, No tenderness, No organomegaly appriciated, No rebound - guarding or rigidity. No Cyanosis, Clubbing or edema, No new Rash or bruise, R hip post op site looks stable    Data Review:    CBC  Recent Labs Lab 09/13/16 0314 09/14/16 0543 09/15/16 0821  WBC 6.1 6.7 7.2  HGB 14.1 10.6* 10.5*  HCT 43.7 34.7* 33.3*  PLT 175 145* 120*  MCV 91.6 94.6 94.6  MCH 29.6 28.9 29.8  MCHC 32.3 30.5 31.5  RDW 14.1 14.7 14.8  LYMPHSABS 2.0  --   --   MONOABS 0.5  --   --   EOSABS 0.3  --   --  BASOSABS 0.0  --   --     Chemistries   Recent Labs Lab 09/13/16 0314 09/14/16 0543 09/15/16 0821  NA 143 138 136  K 3.3* 3.4* 4.6  CL 104 103 104  CO2 '26 28 26  '$ GLUCOSE 113* 126* 138*  BUN '7 15 14  '$ CREATININE 1.02* 1.69* 1.19*  CALCIUM 9.5 8.3* 8.8*   ------------------------------------------------------------------------------------------------------------------ No results for input(s): CHOL, HDL, LDLCALC, TRIG, CHOLHDL, LDLDIRECT in the last 72 hours.  Lab Results  Component Value Date   HGBA1C 5.6 05/22/2015   ------------------------------------------------------------------------------------------------------------------ No results for input(s): TSH, T4TOTAL, T3FREE, THYROIDAB in the last 72 hours.  Invalid input(s): FREET3 ------------------------------------------------------------------------------------------------------------------ No  results for input(s): VITAMINB12, FOLATE, FERRITIN, TIBC, IRON, RETICCTPCT in the last 72 hours.  Coagulation profile  Recent Labs Lab 09/13/16 0314  INR 1.00    No results for input(s): DDIMER in the last 72 hours.  Cardiac Enzymes No results for input(s): CKMB, TROPONINI, MYOGLOBIN in the last 168 hours.  Invalid input(s): CK ------------------------------------------------------------------------------------------------------------------    Component Value Date/Time   BNP 60.2 01/23/2016 2019    Micro Results Recent Results (from the past 240 hour(s))  Surgical pcr screen     Status: None   Collection Time: 09/13/16 11:41 AM  Result Value Ref Range Status   MRSA, PCR NEGATIVE NEGATIVE Final   Staphylococcus aureus NEGATIVE NEGATIVE Final    Comment:        The Xpert SA Assay (FDA approved for NASAL specimens in patients over 22 years of age), is one component of a comprehensive surveillance program.  Test performance has been validated by [facility] for patients greater than or equal to 69 year old. It is not intended to diagnose infection nor to guide or monitor treatment.     Radiology Reports Dg Chest 1 View  Result Date: 09/13/2016 CLINICAL DATA:  Fall. Dizziness leading to fall walking to the restroom. EXAM: CHEST 1 VIEW COMPARISON:  Radiograph 06/17/2016 FINDINGS: Stable borderline mild cardiomegaly. Stable tortuosity of the thoracic aorta. No pulmonary edema. No evidence of pneumothorax. No confluent airspace disease or pleural fluid. No acute osseous abnormalities are seen. Calcified granuloma again seen. No acute osseous abnormalities. IMPRESSION: No acute abnormality. Electronically Signed   By: Jeb Levering M.D.   On: 09/13/2016 04:00   Dg Pelvis 1-2 Views  Result Date: 09/13/2016 CLINICAL DATA:  Patient became dizzy and fell, striking head and right hip. Severe right hip pain. EXAM: PELVIS - 1-2 VIEW COMPARISON:  09/17/2012 FINDINGS: Cortical  irregularity along the greater trochanter of the right hip probably represents nondisplaced fracture. No dislocation of the right hip. Diffuse bone demineralization. Pelvis appears intact. SI joints and symphysis pubis are not displaced. IMPRESSION: Cortical irregularity along the greater trochanter of the right hip probably represents nondisplaced fracture. Electronically Signed   By: Lucienne Capers M.D.   On: 09/13/2016 03:59   Ct Head Wo Contrast  Result Date: 09/13/2016 CLINICAL DATA:  Fall. EXAM: CT HEAD WITHOUT CONTRAST CT CERVICAL SPINE WITHOUT CONTRAST TECHNIQUE: Multidetector CT imaging of the head and cervical spine was performed following the standard protocol without intravenous contrast. Multiplanar CT image reconstructions of the cervical spine were also generated. COMPARISON:  CT head 07/02/2016. CT head and cervical spine 03/26/2016 FINDINGS: CT HEAD FINDINGS Brain: No evidence of acute infarction, hemorrhage, hydrocephalus, extra-axial collection or mass lesion/mass effect. Diffuse cerebral atrophy. Ventricular dilatation consistent with central atrophy. Low-attenuation changes in the deep white matter consistent small vessel ischemia. Calcification in the  basal ganglia. Vascular: No hyperdense vessel or unexpected calcification. Skull: Normal. Negative for fracture or focal lesion. Sinuses/Orbits: No acute finding. Other: No significant change since previous study. CT CERVICAL SPINE FINDINGS Alignment: Normal alignment of the cervical spine and facet joints. Skull base and vertebrae: Skullbase appears intact. Endplate compression deformities at C7 and T2 without change since prior study. No focal bone lesion or bone destruction. Soft tissues and spinal canal: No prevertebral fluid or swelling. No visible canal hematoma. Disc levels: Disc space heights are preserved. Degenerative changes in the cervical facet joints and at C1 -2. Upper chest: Visualized lung apices are clear. Other:  Degenerative changes in the temporomandibular joints. IMPRESSION: No acute intracranial abnormalities. Chronic atrophy and small vessel ischemic changes. Normal alignment of the cervical spine. Chronic endplate compression deformities at C7 and T2 unchanged since previous study, likely representing osteoporosis. Mild degenerative changes. No acute displaced fractures identified. Electronically Signed   By: Lucienne Capers M.D.   On: 09/13/2016 04:23   Ct Cervical Spine Wo Contrast  Result Date: 09/13/2016 CLINICAL DATA:  Fall. EXAM: CT HEAD WITHOUT CONTRAST CT CERVICAL SPINE WITHOUT CONTRAST TECHNIQUE: Multidetector CT imaging of the head and cervical spine was performed following the standard protocol without intravenous contrast. Multiplanar CT image reconstructions of the cervical spine were also generated. COMPARISON:  CT head 07/02/2016. CT head and cervical spine 03/26/2016 FINDINGS: CT HEAD FINDINGS Brain: No evidence of acute infarction, hemorrhage, hydrocephalus, extra-axial collection or mass lesion/mass effect. Diffuse cerebral atrophy. Ventricular dilatation consistent with central atrophy. Low-attenuation changes in the deep white matter consistent small vessel ischemia. Calcification in the basal ganglia. Vascular: No hyperdense vessel or unexpected calcification. Skull: Normal. Negative for fracture or focal lesion. Sinuses/Orbits: No acute finding. Other: No significant change since previous study. CT CERVICAL SPINE FINDINGS Alignment: Normal alignment of the cervical spine and facet joints. Skull base and vertebrae: Skullbase appears intact. Endplate compression deformities at C7 and T2 without change since prior study. No focal bone lesion or bone destruction. Soft tissues and spinal canal: No prevertebral fluid or swelling. No visible canal hematoma. Disc levels: Disc space heights are preserved. Degenerative changes in the cervical facet joints and at C1 -2. Upper chest: Visualized lung  apices are clear. Other: Degenerative changes in the temporomandibular joints. IMPRESSION: No acute intracranial abnormalities. Chronic atrophy and small vessel ischemic changes. Normal alignment of the cervical spine. Chronic endplate compression deformities at C7 and T2 unchanged since previous study, likely representing osteoporosis. Mild degenerative changes. No acute displaced fractures identified. Electronically Signed   By: Lucienne Capers M.D.   On: 09/13/2016 04:23   Ct Hip Right Wo Contrast  Result Date: 09/13/2016 CLINICAL DATA:  Right hip fracture EXAM: CT OF THE RIGHT HIP WITHOUT CONTRAST TECHNIQUE: Multidetector CT imaging of the right hip was performed according to the standard protocol. Multiplanar CT image reconstructions were also generated. COMPARISON:  09/13/2016 radiograph FINDINGS: Bones/Joint/Cartilage Intertrochanteric right hip fracture noted with mild comminution along the greater trochanter an along the base of the left femoral neck. The fracture extends into the upper margin of the lesser trochanter. There is likely in the intra-articular portion involving the Vasa cervical region. Mild spurring of the right femoral head and acetabulum with confluent subcortical cysts along the posterosuperior acetabulum. Ligaments Suboptimally assessed by CT. Muscles and Tendons Unremarkable Soft tissues Mild external iliac artery atherosclerotic calcification. IMPRESSION: 1. Mildly displaced intertrochanteric fracture with comminuted portion along the greater trochanter and minimal comminution along the basicervical region of the  left femoral neck. There could be a small component of intra-articular basicervical involvement. Mild apex anterior angulation of the dominant fracture site as shown on image 31/7. Electronically Signed   By: Van Clines M.D.   On: 09/13/2016 08:16   Mr Knee Left Wo Contrast  Result Date: 09/04/2016 CLINICAL DATA:  Feels like the knee is popping out.  Pain.  EXAM: MRI OF THE LEFT KNEE WITHOUT CONTRAST TECHNIQUE: Multiplanar, multisequence MR imaging of the knee was performed. No intravenous contrast was administered. COMPARISON:  None. 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 No obvious periarticular fluid collection. No osteolysis. No acute fracture or dislocation. Overall alignment is anatomic. No Baker cyst. Ligaments Collateral ligaments are obscured by susceptibility artifact. Muscles and Tendons No muscle signal abnormality. No muscle atrophy or edema. No intramuscular fluid collection or hematoma. Patellar tendon and quadriceps tendon are intact. Soft tissue No fluid collection or hematoma. No soft tissue mass. Popliteal fossa is normal. Normal neurovascular bundles. IMPRESSION: 1. 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 No evidence of hardware failure or complication. 2. No Baker cyst. Electronically Signed   By: Kathreen Devoid   On: 09/04/2016 13:51   Dg C-arm 1-60 Min  Result Date: 09/13/2016 CLINICAL DATA:  Intraoperative fluoroscopic imaging for fixation of a right hip fracture suffered in a fall today. EXAM: DG C-ARM 61-120 MIN; RIGHT FEMUR 2 VIEWS COMPARISON:  Plain films and CT scan right hip this same day. FINDINGS: Three fluoroscopic intraoperative spot views of the right hip are provided. Images demonstrate a short intramedullary nail and 2 proximal screws in place across the intertrochanteric fracture. Position and alignment appear anatomic. No acute abnormality. IMPRESSION: ORIF right intertrochanteric fracture.  No acute finding. Electronically Signed   By: Inge Rise M.D.   On: 09/13/2016 17:03   Dg Femur, Min 2 Views Right  Result Date: 09/13/2016 CLINICAL DATA:  Intraoperative fluoroscopic imaging for fixation of a right hip fracture  suffered in a fall today. EXAM: DG C-ARM 61-120 MIN; RIGHT FEMUR 2 VIEWS COMPARISON:  Plain films and CT scan right hip this same day. FINDINGS: Three fluoroscopic intraoperative spot views of the right hip are provided. Images demonstrate a short intramedullary nail and 2 proximal screws in place across the intertrochanteric fracture. Position and alignment appear anatomic. No acute abnormality. IMPRESSION: ORIF right intertrochanteric fracture.  No acute finding. Electronically Signed   By: Inge Rise M.D.   On: 09/13/2016 17:03   Dg Femur, Min 2 Views Right  Result Date: 09/13/2016 CLINICAL DATA:  Right hip pain after a fall EXAM: RIGHT FEMUR 2 VIEWS COMPARISON:  None. FINDINGS: Nondisplaced fracture of the greater trochanter of the right hip, possibly with focal extension into the inter trochanteric region. No dislocation of the right hip. Degenerative changes in the right hip. Femoral shaft appears intact. Previous left knee arthroplasty. Components as visualized appear well-seated. No significant effusion. IMPRESSION: Nondisplaced fracture of the greater trochanter of the right hip with possible focal extension to the inter trochanteric region. Electronically Signed   By: Lucienne Capers M.D.   On: 09/13/2016 04:00    Time Spent in minutes  30   Lala Lund M.D on 09/15/2016 at 11:10 AM  Between 7am to 7pm - Pager - [phone] ( page via [facility], text pages only, please mention full 10 digit call back number). After 7pm go to [email or web address] - password  [email or web address]

## 2016-09-16 ENCOUNTER — Encounter (HOSPITAL_COMMUNITY): Payer: Self-pay | Admitting: Orthopaedic Surgery

## 2016-09-16 DIAGNOSIS — F411 Generalized anxiety disorder: Secondary | ICD-10-CM | POA: Diagnosis not present

## 2016-09-16 DIAGNOSIS — R339 Retention of urine, unspecified: Secondary | ICD-10-CM | POA: Diagnosis not present

## 2016-09-16 DIAGNOSIS — S72001A Fracture of unspecified part of neck of right femur, initial encounter for closed fracture: Secondary | ICD-10-CM | POA: Diagnosis not present

## 2016-09-16 DIAGNOSIS — N329 Bladder disorder, unspecified: Secondary | ICD-10-CM | POA: Diagnosis not present

## 2016-09-16 DIAGNOSIS — K219 Gastro-esophageal reflux disease without esophagitis: Secondary | ICD-10-CM | POA: Diagnosis not present

## 2016-09-16 DIAGNOSIS — Z741 Need for assistance with personal care: Secondary | ICD-10-CM | POA: Diagnosis not present

## 2016-09-16 DIAGNOSIS — M4802 Spinal stenosis, cervical region: Secondary | ICD-10-CM | POA: Diagnosis not present

## 2016-09-16 DIAGNOSIS — S0990XS Unspecified injury of head, sequela: Secondary | ICD-10-CM | POA: Diagnosis not present

## 2016-09-16 DIAGNOSIS — E8889 Other specified metabolic disorders: Secondary | ICD-10-CM | POA: Diagnosis not present

## 2016-09-16 DIAGNOSIS — F339 Major depressive disorder, recurrent, unspecified: Secondary | ICD-10-CM | POA: Diagnosis not present

## 2016-09-16 DIAGNOSIS — I10 Essential (primary) hypertension: Secondary | ICD-10-CM | POA: Diagnosis not present

## 2016-09-16 DIAGNOSIS — W19XXXS Unspecified fall, sequela: Secondary | ICD-10-CM | POA: Diagnosis not present

## 2016-09-16 DIAGNOSIS — E785 Hyperlipidemia, unspecified: Secondary | ICD-10-CM | POA: Diagnosis not present

## 2016-09-16 DIAGNOSIS — R262 Difficulty in walking, not elsewhere classified: Secondary | ICD-10-CM | POA: Diagnosis not present

## 2016-09-16 DIAGNOSIS — S72141A Displaced intertrochanteric fracture of right femur, initial encounter for closed fracture: Secondary | ICD-10-CM | POA: Diagnosis not present

## 2016-09-16 MED ORDER — ALBUTEROL SULFATE (2.5 MG/3ML) 0.083% IN NEBU: 2.5000 mg | INHALATION_SOLUTION | Freq: Two times a day (BID) | RESPIRATORY_TRACT | Status: DC

## 2016-09-16 MED ORDER — TAMSULOSIN HCL 0.4 MG PO CAPS: 0.4000 mg | ORAL_CAPSULE | Freq: Every day | ORAL | Status: DC

## 2016-09-16 MED ORDER — HYDROCODONE-ACETAMINOPHEN 7.5-325 MG PO TABS
1.0000 | ORAL_TABLET | Freq: Four times a day (QID) | ORAL | Status: DC | PRN
  Administered 2016-09-16 (×2): 1 via ORAL
  Filled 2016-09-16 (×2): qty 1

## 2016-09-16 NOTE — Progress Notes (Signed)
Pt discharge education and instructions completed. Pt IV removed; report called off to nurse Narjtte at [facility]. Foley remains intact and unclamped; surgical hip dsg remains clean, dry and intact; pt transport off unit via stretcher with family and belongings to the side. Pt transported by [facility] to facility. Delia Heady RN

## 2016-09-16 NOTE — Discharge Instructions (Signed)
Follow with Primary MD Wenda Low, MD in 7 days   Get CBC, CMP, 2 view Chest X ray checked  by Primary MD or SNF MD in 5-7 days ( we routinely change or add medications that can affect your baseline labs and fluid status, therefore we recommend that you get the mentioned basic workup next visit with your PCP, your PCP may decide not to get them or add new tests based on their clinical decision)  Activity: weight bearing as tolerated with Full fall precautions use walker/cane & assistance as needed  Disposition Home/SNF ( patient refusing placement from time to time)  Diet:   Heart Healthy   For Heart failure patients - Check your Weight same time everyday, if you gain over 2 pounds, or you develop in leg swelling, experience more shortness of breath or chest pain, call your Primary MD immediately. Follow Cardiac Low Salt Diet and 1.5 lit/day fluid restriction.  On your next visit with your primary care physician please Get Medicines reviewed and adjusted.  Please request your Prim.MD to go over all Hospital Tests and Procedure/Radiological results at the follow up, please get all Hospital records sent to your Prim MD by signing hospital release before you go home.  If you experience worsening of your admission symptoms, develop shortness of breath, life threatening emergency, suicidal or homicidal thoughts you must seek medical attention immediately by calling 911 or calling your MD immediately  if symptoms less severe.  You Must read complete instructions/literature along with all the possible adverse reactions/side effects for all the Medicines you take and that have been prescribed to you. Take any new Medicines after you have completely understood and accpet all the possible adverse reactions/side effects.   Do not drive, operate heavy machinery, perform activities at heights, swimming or participation in water activities or provide baby sitting services if your were admitted for syncope  or siezures until you have seen by Primary MD or a Neurologist and advised to do so again.  Do not drive when taking Pain medications.    Do not take more than prescribed Pain, Sleep and Anxiety Medications  Special Instructions: If you have smoked or chewed Tobacco  in the last 2 yrs please stop smoking, stop any regular Alcohol  and or any Recreational drug use.  Wear Seat belts while driving.   Please note  You were cared for by a hospitalist during your hospital stay. If you have any questions about your discharge medications or the care you received while you were in the hospital after you are discharged, you can call the unit and asked to speak with the hospitalist on call if the hospitalist that took care of you is not available. Once you are discharged, your primary care physician will handle any further medical issues. Please note that NO REFILLS for any discharge medications will be authorized once you are discharged, as it is imperative that you return to your primary care physician (or establish a relationship with a primary care physician if you do not have one) for your aftercare needs so that they can reassess your need for medications and monitor your lab values.

## 2016-09-16 NOTE — Social Work (Signed)
Clinical Social Worker facilitated patient discharge including contacting patient family and facility to confirm patient discharge plans.  Clinical information faxed to facility and family agreeable with plan.  CSW arranged ambulance transport via [facility] to [facility].  RN to call [phone] report prior to discharge.  Clinical Social Worker will sign off for now as social work intervention is no longer needed. Please consult [place] again if new need arises.  Elissa Hefty, LCSW Clinical Social Worker [phone]

## 2016-09-16 NOTE — Progress Notes (Signed)
Stable for dc from ortho stand point

## 2016-09-16 NOTE — Clinical Social Work Placement (Signed)
   CLINICAL SOCIAL WORK PLACEMENT  NOTE  Date:  09/16/2016  Patient Details  Name: [name] MRN: [id number] Date of Birth: 12-21-1940  Clinical Social Work is seeking post-discharge placement for this patient at the [place] level of care (*CSW will initial, date and re-position this form in  chart as items are completed):  Yes   Patient/family provided with [place] Work Department's list of facilities offering this level of care within the geographic area requested by the patient (or if unable, by the patient's family).  Yes   Patient/family informed of their freedom to choose among providers that offer the needed level of care, that participate in Medicare, Medicaid or managed care program needed by the patient, have an available bed and are willing to accept the patient.  Yes   Patient/family informed of [place]'s ownership interest in [facility] and [facility], as well as of the fact that they are under no obligation to receive care at these facilities.  PASRR submitted to EDS on       PASRR number received on       Existing PASRR number confirmed on 09/16/16     FL2 transmitted to all facilities in geographic area requested by pt/family on 09/16/16     FL2 transmitted to all facilities within larger geographic area on 09/16/16     Patient informed that his/her managed care company has contracts with or will negotiate with certain facilities, including the following:        Yes   Patient/family informed of bed offers received.  Patient chooses bed at [facility]     Physician recommends and patient chooses bed at      Patient to be transferred to [facility] on 09/16/16.  Patient to be transferred to facility by [facility]     Patient family notified on 09/16/16 of transfer.  Name of family member notified:  [name], daughter at bedside     PHYSICIAN Please sign FL2, Please prepare prescriptions,  Please prepare priority discharge summary, including medications     Additional Comment:    _______________________________________________ Normajean Baxter, LCSW 09/16/2016, 3:31 PM

## 2016-09-16 NOTE — Discharge Summary (Signed)
[name] AJG:[id number] DOB: June 20, 1940 DOA: 09/13/2016  PCP: Wenda Low, MD  Admit date: 09/13/2016  Discharge date: 09/16/2016  Admitted From: Home  Disposition:  SNF   Recommendations for Outpatient Follow-up:   Follow up with PCP in 1-2 weeks  PCP Please obtain BMP/CBC, 2 view CXR in 1week,  (see Discharge instructions)   PCP Please follow up on the following pending results: None   Home Health: None   Equipment/Devices: None  Consultations: Ortho Discharge Condition: Stable   CODE STATUS: Full   Diet Recommendation:  Heart Healthy    Chief Complaint  Patient presents with  . Fall     Brief history of present illness from the day of admission and additional interim summary     76 year old with a history of degenerative disc disease, hypertension, hyperlipidemia, multiple strokes. Patient brought to the hospital via EMS after falling and was found to have R hip fracture.                                                                 Hospital Course    1. Mech.Fall with R. Intertrochanteric fracture - status post treatment of R. intertrochanteric fracture with intramedullary implant - 09-13-16, by Dr Erlinda Hong. Lovenox for DVT prophylaxis, WBAT, PT eval done, minimal perioperative blood loss related anemia. She is now agreeable to going to SNF, will DC to SNF  2. HTN - on norvasc  3. GERD - on PPI  4. Dyslipidemia - on statin.  5. H/O CVA - supportive rx.  6. Anxiety and depression - home meds continued.  7. Hypokalemia - replaced and stable.  8.Urinary retention. Likely due to combination of lack of activity, narcotics. Flomax and Foley, try to remove after 7-10 days in SNF once she is more active.  9. ARF resolved after hydration.   Discharge diagnosis     Principal  Problem:   Hip fracture (HCC) Active Problems:   HLD (hyperlipidemia)   HTN (hypertension)   GERD (gastroesophageal reflux disease)   Fall   Displaced intertrochanteric fracture of right femur, initial encounter for closed fracture [facility])    Discharge instructions    Discharge Instructions    Diet - low sodium heart healthy    Complete by:  As directed    Discharge instructions    Complete by:  As directed    Follow with Primary MD Wenda Low, MD in 7 days   Get CBC, CMP, 2 view Chest X ray checked  by Primary MD or SNF MD in 5-7 days ( we routinely change or add medications that can affect your baseline labs and fluid status, therefore we recommend that you get the mentioned basic workup next visit with your PCP, your PCP may decide not to get them or add new tests based on their  clinical decision)  Activity: weight bearing as tolerated with Full fall precautions use walker/cane & assistance as needed  Disposition Home/SNF ( patient refusing placement from time to time)  Diet:   Heart Healthy   For Heart failure patients - Check your Weight same time everyday, if you gain over 2 pounds, or you develop in leg swelling, experience more shortness of breath or chest pain, call your Primary MD immediately. Follow Cardiac Low Salt Diet and 1.5 lit/day fluid restriction.  On your next visit with your primary care physician please Get Medicines reviewed and adjusted.  Please request your Prim.MD to go over all Hospital Tests and Procedure/Radiological results at the follow up, please get all Hospital records sent to your Prim MD by signing hospital release before you go home.  If you experience worsening of your admission symptoms, develop shortness of breath, life threatening emergency, suicidal or homicidal thoughts you must seek medical attention immediately by calling 911 or calling your MD immediately  if symptoms less severe.  You Must read complete instructions/literature along  with all the possible adverse reactions/side effects for all the Medicines you take and that have been prescribed to you. Take any new Medicines after you have completely understood and accpet all the possible adverse reactions/side effects.   Do not drive, operate heavy machinery, perform activities at heights, swimming or participation in water activities or provide baby sitting services if your were admitted for syncope or siezures until you have seen by Primary MD or a Neurologist and advised to do so again.  Do not drive when taking Pain medications.    Do not take more than prescribed Pain, Sleep and Anxiety Medications  Special Instructions: If you have smoked or chewed Tobacco  in the last 2 yrs please stop smoking, stop any regular Alcohol  and or any Recreational drug use.  Wear Seat belts while driving.   Please note  You were cared for by a hospitalist during your hospital stay. If you have any questions about your discharge medications or the care you received while you were in the hospital after you are discharged, you can call the unit and asked to speak with the hospitalist on call if the hospitalist that took care of you is not available. Once you are discharged, your primary care physician will handle any further medical issues. Please note that NO REFILLS for any discharge medications will be authorized once you are discharged, as it is imperative that you return to your primary care physician (or establish a relationship with a primary care physician if you do not have one) for your aftercare needs so that they can reassess your need for medications and monitor your lab values.   Weight bearing as tolerated    Complete by:  As directed       Discharge Medications   Allergies as of 09/16/2016      Reactions   Ampicillin-sulbactam Sodium Other (See Comments)   Muscles draw up tight - severe headache angioedema   Ampicillin Nausea Only, Other (See Comments)   Thrush.     Toradol [ketorolac Tromethamine] Nausea Only, Other (See Comments)   Headaches, back aches   Tramadol Rash, Other (See Comments)   Headache.    Ambien [zolpidem Tartrate] Other (See Comments)   unknown   Cephalexin Other (See Comments)   unknown   Ketorolac Nausea And Vomiting   Percocet [oxycodone-acetaminophen] Itching, Rash   Vicodin is OK, "itches but takes Benadryl"   Sulfamethoxazole-trimethoprim Other (See  Comments)   unknown   Tape Other (See Comments)   Paper tape only.      Medication List    TAKE these medications   acetaminophen 325 MG tablet Commonly known as:  TYLENOL Take 2 tablets (650 mg total) by mouth every 6 (six) hours as needed for mild pain (or Fever >/= 101).   albuterol 108 (90 Base) MCG/ACT inhaler Commonly known as:  PROVENTIL HFA;VENTOLIN HFA Inhale 1 puff into the lungs every 6 (six) hours as needed for wheezing or shortness of breath.   albuterol (2.5 MG/3ML) 0.083% nebulizer solution Commonly known as:  PROVENTIL Take 2.5 mg by nebulization every 6 (six) hours as needed for wheezing or shortness of breath.   ALPRAZolam 1 MG tablet Commonly known as:  XANAX Take 1 tablet (1 mg total) by mouth at bedtime.   amLODipine 5 MG tablet Commonly known as:  NORVASC Take 5 mg by mouth daily.   busPIRone 5 MG tablet Commonly known as:  BUSPAR Take 5 mg by mouth 2 (two) times daily.   citalopram 20 MG tablet Commonly known as:  CELEXA Take 30 mg by mouth daily.   enoxaparin 40 MG/0.4ML injection Commonly known as:  LOVENOX Inject 0.4 mLs (40 mg total) into the skin daily.   esomeprazole 40 MG capsule Commonly known as:  NEXIUM Take 40 mg by mouth 2 (two) times daily before a meal.   gabapentin 400 MG capsule Commonly known as:  NEURONTIN TAKE 1 CAPSULE BY MOUTH FOUR TIMES A DAY   HYDROcodone-acetaminophen 7.5-325 MG tablet Commonly known as:  NORCO Take 1 tablet by mouth 3 (three) times daily.   IRON PO Take 1 tablet by mouth 2 (two)  times a week.   lovastatin 10 MG tablet Commonly known as:  MEVACOR Take 10 mg by mouth daily.   meloxicam 15 MG tablet Commonly known as:  MOBIC Take 15 mg by mouth every evening.   methocarbamol 500 MG tablet Commonly known as:  ROBAXIN Take 250 mg by mouth 2 (two) times daily.   naloxegol oxalate 25 MG Tabs tablet Commonly known as:  MOVANTIK Take 1 tablet (25 mg total) by mouth daily.   ondansetron 4 MG tablet Commonly known as:  ZOFRAN Take 4 mg by mouth every 8 (eight) hours as needed for nausea.   polyethylene glycol powder powder Commonly known as:  GLYCOLAX/MIRALAX Take 17 g by mouth daily. For constipation   tamsulosin 0.4 MG Caps capsule Commonly known as:  FLOMAX Take 1 capsule (0.4 mg total) by mouth daily.   traZODone 50 MG tablet Commonly known as:  DESYREL Take 0.5 tablets (25 mg total) by mouth at bedtime.   vitamin B-12 1000 MCG tablet Commonly known as:  CYANOCOBALAMIN Take 2,000 mcg by mouth daily.   VOLTAREN 1 % Gel Generic drug:  diclofenac sodium Apply 2 g topically 4 (four) times daily as needed (for pain).       Follow-up Information    Eduard Roux, MD. Schedule an appointment as soon as possible for a visit in 1 week(s).   Specialty:  Orthopedic Surgery Contact information: [place] [address] [phone]        Wenda Low, MD. Schedule an appointment as soon as possible for a visit in 1 week(s).   Specialty:  Internal Medicine Contact information: [address] [place] [address] [place] [address] [phone]           Major procedures and Radiology Reports - PLEASE  review detailed and final reports thoroughly  -      Treatment of R. intertrochanteric fracture with intramedullary implant - 09-13-16, Dr Erlinda Hong   Dg Chest 1 View  Result Date: 09/13/2016 CLINICAL DATA:  Fall. Dizziness leading to fall walking to the restroom. EXAM: CHEST 1 VIEW COMPARISON:  Radiograph 06/17/2016  FINDINGS: Stable borderline mild cardiomegaly. Stable tortuosity of the thoracic aorta. No pulmonary edema. No evidence of pneumothorax. No confluent airspace disease or pleural fluid. No acute osseous abnormalities are seen. Calcified granuloma again seen. No acute osseous abnormalities. IMPRESSION: No acute abnormality. Electronically Signed   By: Jeb Levering M.D.   On: 09/13/2016 04:00   Dg Pelvis 1-2 Views  Result Date: 09/13/2016 CLINICAL DATA:  Patient became dizzy and fell, striking head and right hip. Severe right hip pain. EXAM: PELVIS - 1-2 VIEW COMPARISON:  09/17/2012 FINDINGS: Cortical irregularity along the greater trochanter of the right hip probably represents nondisplaced fracture. No dislocation of the right hip. Diffuse bone demineralization. Pelvis appears intact. SI joints and symphysis pubis are not displaced. IMPRESSION: Cortical irregularity along the greater trochanter of the right hip probably represents nondisplaced fracture. Electronically Signed   By: Lucienne Capers M.D.   On: 09/13/2016 03:59   Ct Head Wo Contrast  Result Date: 09/13/2016 CLINICAL DATA:  Fall. EXAM: CT HEAD WITHOUT CONTRAST CT CERVICAL SPINE WITHOUT CONTRAST TECHNIQUE: Multidetector CT imaging of the head and cervical spine was performed following the standard protocol without intravenous contrast. Multiplanar CT image reconstructions of the cervical spine were also generated. COMPARISON:  CT head 07/02/2016. CT head and cervical spine 03/26/2016 FINDINGS: CT HEAD FINDINGS Brain: No evidence of acute infarction, hemorrhage, hydrocephalus, extra-axial collection or mass lesion/mass effect. Diffuse cerebral atrophy. Ventricular dilatation consistent with central atrophy. Low-attenuation changes in the deep white matter consistent small vessel ischemia. Calcification in the basal ganglia. Vascular: No hyperdense vessel or unexpected calcification. Skull: Normal. Negative for fracture or focal lesion.  Sinuses/Orbits: No acute finding. Other: No significant change since previous study. CT CERVICAL SPINE FINDINGS Alignment: Normal alignment of the cervical spine and facet joints. Skull base and vertebrae: Skullbase appears intact. Endplate compression deformities at C7 and T2 without change since prior study. No focal bone lesion or bone destruction. Soft tissues and spinal canal: No prevertebral fluid or swelling. No visible canal hematoma. Disc levels: Disc space heights are preserved. Degenerative changes in the cervical facet joints and at C1 -2. Upper chest: Visualized lung apices are clear. Other: Degenerative changes in the temporomandibular joints. IMPRESSION: No acute intracranial abnormalities. Chronic atrophy and small vessel ischemic changes. Normal alignment of the cervical spine. Chronic endplate compression deformities at C7 and T2 unchanged since previous study, likely representing osteoporosis. Mild degenerative changes. No acute displaced fractures identified. Electronically Signed   By: Lucienne Capers M.D.   On: 09/13/2016 04:23   Ct Cervical Spine Wo Contrast  Result Date: 09/13/2016 CLINICAL DATA:  Fall. EXAM: CT HEAD WITHOUT CONTRAST CT CERVICAL SPINE WITHOUT CONTRAST TECHNIQUE: Multidetector CT imaging of the head and cervical spine was performed following the standard protocol without intravenous contrast. Multiplanar CT image reconstructions of the cervical spine were also generated. COMPARISON:  CT head 07/02/2016. CT head and cervical spine 03/26/2016 FINDINGS: CT HEAD FINDINGS Brain: No evidence of acute infarction, hemorrhage, hydrocephalus, extra-axial collection or mass lesion/mass effect. Diffuse cerebral atrophy. Ventricular dilatation consistent with central atrophy. Low-attenuation changes in the deep white matter consistent small vessel ischemia. Calcification in the basal ganglia. Vascular: No hyperdense  vessel or unexpected calcification. Skull: Normal. Negative for  fracture or focal lesion. Sinuses/Orbits: No acute finding. Other: No significant change since previous study. CT CERVICAL SPINE FINDINGS Alignment: Normal alignment of the cervical spine and facet joints. Skull base and vertebrae: Skullbase appears intact. Endplate compression deformities at C7 and T2 without change since prior study. No focal bone lesion or bone destruction. Soft tissues and spinal canal: No prevertebral fluid or swelling. No visible canal hematoma. Disc levels: Disc space heights are preserved. Degenerative changes in the cervical facet joints and at C1 -2. Upper chest: Visualized lung apices are clear. Other: Degenerative changes in the temporomandibular joints. IMPRESSION: No acute intracranial abnormalities. Chronic atrophy and small vessel ischemic changes. Normal alignment of the cervical spine. Chronic endplate compression deformities at C7 and T2 unchanged since previous study, likely representing osteoporosis. Mild degenerative changes. No acute displaced fractures identified. Electronically Signed   By: Lucienne Capers M.D.   On: 09/13/2016 04:23   Ct Hip Right Wo Contrast  Result Date: 09/13/2016 CLINICAL DATA:  Right hip fracture EXAM: CT OF THE RIGHT HIP WITHOUT CONTRAST TECHNIQUE: Multidetector CT imaging of the right hip was performed according to the standard protocol. Multiplanar CT image reconstructions were also generated. COMPARISON:  09/13/2016 radiograph FINDINGS: Bones/Joint/Cartilage Intertrochanteric right hip fracture noted with mild comminution along the greater trochanter an along the base of the left femoral neck. The fracture extends into the upper margin of the lesser trochanter. There is likely in the intra-articular portion involving the V[place] cervical region. Mild spurring of the right femoral head and acetabulum with confluent subcortical cysts along the posterosuperior acetabulum. Ligaments Suboptimally assessed by CT. Muscles and Tendons Unremarkable Soft  tissues Mild external iliac artery atherosclerotic calcification. IMPRESSION: 1. Mildly displaced intertrochanteric fracture with comminuted portion along the greater trochanter and minimal comminution along the basicervical region of the left femoral neck. There could be a small component of intra-articular basicervical involvement. Mild apex anterior angulation of the dominant fracture site as shown on image 31/7. Electronically Signed   By: Van Clines M.D.   On: 09/13/2016 08:16   Mr Knee Left Wo Contrast  Result Date: 09/04/2016 CLINICAL DATA:  Feels like the knee is popping out.  Pain. EXAM: MRI OF THE LEFT KNEE WITHOUT CONTRAST TECHNIQUE: Multiplanar, multisequence MR imaging of the knee was performed. No intravenous contrast was administered. COMPARISON:  None. 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 No obvious periarticular fluid collection. No osteolysis. No acute fracture or dislocation. Overall alignment is anatomic. No Baker cyst. Ligaments Collateral ligaments are obscured by susceptibility artifact. Muscles and Tendons No muscle signal abnormality. No muscle atrophy or edema. No intramuscular fluid collection or hematoma. Patellar tendon and quadriceps tendon are intact. Soft tissue No fluid collection or hematoma. No soft tissue mass. Popliteal fossa is normal. Normal neurovascular bundles. IMPRESSION: 1. 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 No evidence of hardware failure or complication. 2. No Baker cyst. Electronically Signed   By: Kathreen Devoid   On: 09/04/2016 13:51   Dg C-arm 1-60 Min  Result Date: 09/13/2016 CLINICAL DATA:  Intraoperative fluoroscopic imaging for fixation of a right hip fracture suffered in a fall today. EXAM: DG C-ARM 61-120 MIN; RIGHT FEMUR 2 VIEWS COMPARISON:   Plain films and CT scan right hip this same day. FINDINGS: Three fluoroscopic intraoperative spot views of the right hip are provided.  Images demonstrate a short intramedullary nail and 2 proximal screws in place across the intertrochanteric fracture. Position and alignment appear anatomic. No acute abnormality. IMPRESSION: ORIF right intertrochanteric fracture.  No acute finding. Electronically Signed   By: Inge Rise M.D.   On: 09/13/2016 17:03   Dg Femur, Min 2 Views Right  Result Date: 09/13/2016 CLINICAL DATA:  Intraoperative fluoroscopic imaging for fixation of a right hip fracture suffered in a fall today. EXAM: DG C-ARM 61-120 MIN; RIGHT FEMUR 2 VIEWS COMPARISON:  Plain films and CT scan right hip this same day. FINDINGS: Three fluoroscopic intraoperative spot views of the right hip are provided. Images demonstrate a short intramedullary nail and 2 proximal screws in place across the intertrochanteric fracture. Position and alignment appear anatomic. No acute abnormality. IMPRESSION: ORIF right intertrochanteric fracture.  No acute finding. Electronically Signed   By: Inge Rise M.D.   On: 09/13/2016 17:03   Dg Femur, Min 2 Views Right  Result Date: 09/13/2016 CLINICAL DATA:  Right hip pain after a fall EXAM: RIGHT FEMUR 2 VIEWS COMPARISON:  None. FINDINGS: Nondisplaced fracture of the greater trochanter of the right hip, possibly with focal extension into the inter trochanteric region. No dislocation of the right hip. Degenerative changes in the right hip. Femoral shaft appears intact. Previous left knee arthroplasty. Components as visualized appear well-seated. No significant effusion. IMPRESSION: Nondisplaced fracture of the greater trochanter of the right hip with possible focal extension to the inter trochanteric region. Electronically Signed   By: Lucienne Capers M.D.   On: 09/13/2016 04:00    Micro Results     Recent Results (from the past 240 hour(s))  Surgical pcr screen      Status: None   Collection Time: 09/13/16 11:41 AM  Result Value Ref Range Status   MRSA, PCR NEGATIVE NEGATIVE Final   Staphylococcus aureus NEGATIVE NEGATIVE Final    Comment:        The Xpert SA Assay (FDA approved for NASAL specimens in patients over 66 years of age), is one component of a comprehensive surveillance program.  Test performance has been validated by [facility] for patients greater than or equal to 19 year old. It is not intended to diagnose infection nor to guide or monitor treatment.     Today   Subjective    [name] today has no headache,no chest abdominal pain,no new weakness tingling or numbness, feels much better     Objective   Blood pressure (!) 129/43, pulse 92, temperature 100.1 F (37.8 C), temperature source Oral, resp. rate 18, height '5\' 6"'$  (1.676 m), weight 81.6 kg (180 lb), SpO2 96 %.   Intake/Output Summary (Last 24 hours) at 09/16/16 1024 Last data filed at 09/16/16 0353  Gross per 24 hour  Intake              240 ml  Output             2400 ml  Net            -2160 ml    Exam Awake Alert, Oriented x 3, No new F.N deficits, Normal affect [place].AT,PERRAL Supple Neck,No JVD, No cervical lymphadenopathy appriciated.  Symmetrical Chest wall movement, Good air movement bilaterally, CTAB RRR,No Gallops,Rubs or new Murmurs, No Parasternal Heave +ve B.Sounds, Abd Soft, Non tender, No organomegaly appriciated, No rebound -guarding or rigidity. No Cyanosis, Clubbing or edema, No new Rash or bruise, R hip post op scar stable   Data Review  CBC w Diff: Lab Results  Component Value Date   WBC 7.2 09/15/2016   HGB 10.5 (L) 09/15/2016   HCT 33.3 (L) 09/15/2016   PLT 120 (L) 09/15/2016   LYMPHOPCT 32 09/13/2016   MONOPCT 9 09/13/2016   EOSPCT 5 09/13/2016   BASOPCT 0 09/13/2016    CMP: Lab Results  Component Value Date   NA 136 09/15/2016   K 4.6 09/15/2016   CL 104 09/15/2016   CO2 26 09/15/2016   BUN 14 09/15/2016    CREATININE 1.19 (H) 09/15/2016   PROT 8.2 (H) 04/15/2016   ALBUMIN 4.2 04/15/2016   BILITOT 0.7 04/15/2016   ALKPHOS 85 04/15/2016   AST 25 04/15/2016   ALT 17 04/15/2016  .   Total Time in preparing paper work, data evaluation and todays exam - 62 minutes  Lala Lund M.D on 09/16/2016 at 10:24 AM  [facility]   Office  [phone]

## 2016-09-16 NOTE — Clinical Social Work Note (Signed)
Clinical Social Work Assessment  Patient Details  Name: [name] MRN: [id number] Date of Birth: 07/03/1940  Date of referral:  09/16/16               Reason for consult:  Facility Placement                Permission sought to share information with:    Permission granted to share information::  Yes, Verbal Permission Granted  Name::     Optician, dispensing::  SNF  Relationship::  Daughter  Contact Information:     Housing/Transportation Living arrangements for the past 2 months:  Single Family Home Source of Information:  Patient, Adult Children Patient Interpreter Needed:  None Criminal Activity/Legal Involvement Pertinent to Current Situation/Hospitalization:  No - Comment as needed Significant Relationships:    Lives with:  Adult Children Do you feel safe going back to the place where you live?  No Need for family participation in patient care:  Yes (Comment)  Care giving concerns:  Patient resided with daughter prior to injury. Patient was able to manage ADL's independently prior to injury.  Patient unsafe to return home at this time and will need short term rehabilitation.  Social Worker assessment / plan:  CSW met with patient and daughter at bedside. CSW introduced self and role. CSW explained DC plan and options. CSW discussed SNF and placement options. Daughter indicated that she would like a place near home in [place] and gave permission to send offers to [place] as well.  Employment status:  Retired Nurse, adult PT Recommendations:  [place] / Referral to community resources:  [place]  Patient/Family's Response to care:  Patient and daughter are appreciative of CSW assistance. No issues with care indicated at this time.  Patient/Family's Understanding of and Emotional Response to Diagnosis, Current Treatment, and Prognosis:  Patient and daughter understanding of current treatment, diagnosis  and prognosis.  They are in agreement with DC plan to SNF and understand the recommendation.  They are hopeful that patient will return to baseline prior to injury with short term rehabilitation. No issues or concerns indicated at this time.  Emotional Assessment Appearance:  Appears stated age Attitude/Demeanor/Rapport:   (Cooperative) Affect (typically observed):  Accepting, Appropriate Orientation:  Oriented to Self, Oriented to Place, Oriented to  Time, Oriented to Situation Alcohol / Substance use:  Not Applicable Psych involvement (Current and /or in the community):  No (Comment)  Discharge Needs  Concerns to be addressed:  Care Coordination Readmission within the last 30 days:  No Current discharge risk:  Dependent with Mobility, Physical Impairment Barriers to Discharge:  No Barriers Identified   Normajean Baxter, LCSW 09/16/2016, 2:23 PM

## 2016-09-16 NOTE — NC FL2 (Signed)
[place] MEDICAID FL2 LEVEL OF CARE SCREENING TOOL     IDENTIFICATION  Patient Name: [name] Birthdate: 1941/03/04 Sex: female Admission Date (Current Location): 09/13/2016  [facility] and [place] Number:  Herbalist and Address:  [facility] [place] [address] [place], [place] [address]      Provider Number: [id number]  Attending Physician Name and Address:  Thurnell Lose, MD  Relative Name and Phone Number:  [name], daughter, [phone]    Current Level of Care: Hospital Recommended Level of Care: [place] Prior Approval Number:    Date Approved/Denied:   PASRR Number: [id number]  Discharge Plan: SNF    Current Diagnoses: Patient Active Problem List   Diagnosis Date Noted  . Hip fracture ([place]) 09/13/2016  . Displaced intertrochanteric fracture of right femur, initial encounter for closed fracture ([place]) 09/13/2016  . Fall   . Chronic pain of both shoulders 08/12/2016  . Spinal stenosis in cervical region 06/27/2015  . Syncope 05/22/2015  . Diffuse myofascial pain syndrome 03/24/2015  . Sepsis ([place]) 10/08/2014  . Bacteremia 10/07/2014  . Leukopenia 10/07/2014  . Thrombocytopenia ([place]) 10/07/2014  . Fever 10/05/2014  . Hypoxia 10/05/2014  . HLD (hyperlipidemia) 07/30/2013  . HTN (hypertension) 07/30/2013  . GERD (gastroesophageal reflux disease) 07/30/2013  . Chest pain 07/29/2013  . Dizziness and giddiness 06/18/2013  . Gait difficulty 05/03/2013  . Memory loss 05/03/2013    Orientation RESPIRATION BLADDER Height & Weight     Self, Time, Situation, Place  Normal Incontinent, Indwelling catheter Weight: 81.6 kg (180 lb) Height:  '5\' 6"'$  (167.6 cm)  BEHAVIORAL SYMPTOMS/MOOD NEUROLOGICAL BOWEL NUTRITION STATUS      Continent Diet (Please see DC Summary)  AMBULATORY STATUS COMMUNICATION OF NEEDS Skin   Extensive Assist Verbally Surgical wounds (Closed incision on hip)                        Personal Care Assistance Level of Assistance  Bathing, Feeding, Dressing Bathing Assistance: Maximum assistance Feeding assistance: Independent Dressing Assistance: Independent     Functional Limitations Info             SPECIAL CARE FACTORS FREQUENCY  PT (By licensed PT)     PT Frequency: 5x/week              Contractures      Additional Factors Info  Code Status, Allergies, Psychotropic Code Status Info: Full Allergies Info: Ampicillin-sulbactam Sodium, Ampicillin, Toradol Ketorolac Tromethamine, Tramadol, Ambien Zolpidem Tartrate, Cephalexin, Ketorolac, Percocet Oxycodone-acetaminophen, Sulfamethoxazole-trimethoprim, Tape Psychotropic Info: Buspar         Current Medications (09/16/2016):  This is the current hospital active medication list Current Facility-Administered Medications  Medication Dose Route Frequency Provider Last Rate Last Dose  . acetaminophen (TYLENOL) tablet 650 mg  650 mg Oral Q6H PRN Reubin Milan, MD   650 mg at 09/15/16 1509  . albuterol (PROVENTIL) (2.5 MG/3ML) 0.083% nebulizer solution 2.5 mg  2.5 mg Nebulization Q6H PRN Brenton Grills, PA-C   2.5 mg at 09/16/16 2952  . albuterol (PROVENTIL) (2.5 MG/3ML) 0.083% nebulizer solution 2.5 mg  2.5 mg Nebulization BID Thurnell Lose, MD      . ALPRAZolam Duanne Moron) tablet 1 mg  1 mg Oral QHS PRN Leandrew Koyanagi, MD      . alum & mag hydroxide-simeth (MAALOX/MYLANTA) 200-200-20 MG/5ML suspension 30 mL  30 mL Oral Q4H PRN Naiping Ephriam Jenkins, MD      .  amLODipine (NORVASC) tablet 5 mg  5 mg Oral Daily Brenton Grills, PA-C   5 mg at 09/16/16 0900  . bisacodyl (DULCOLAX) EC tablet 10 mg  10 mg Oral Daily Brenton Grills, PA-C   10 mg at 09/16/16 0900  . busPIRone (BUSPAR) tablet 5 mg  5 mg Oral BID Brenton Grills, PA-C   5 mg at 09/16/16 0900  . citalopram (CELEXA) tablet 30 mg  30 mg Oral Daily Brenton Grills, PA-C   30 mg at 09/16/16 0900  . diphenhydrAMINE (BENADRYL) injection 12.5 mg   12.5 mg Intravenous Q6H PRN Sid Falcon, MD   12.5 mg at 09/13/16 2025  . enoxaparin (LOVENOX) injection 40 mg  40 mg Subcutaneous Q24H Naiping Ephriam Jenkins, MD   40 mg at 09/16/16 0818  . ferrous sulfate tablet 325 mg  325 mg Oral Once per day on Mon Thu Marina S Kyazimova, PA-C   325 mg at 09/16/16 0818  . gabapentin (NEURONTIN) capsule 400 mg  400 mg Oral QID Brenton Grills, PA-C   400 mg at 09/16/16 0900  . HYDROcodone-acetaminophen (NORCO) 7.5-325 MG per tablet 1 tablet  1 tablet Oral Q6H PRN Thurnell Lose, MD   1 tablet at 09/16/16 0846  . methocarbamol (ROBAXIN) tablet 250 mg  250 mg Oral Q8H PRN Thurnell Lose, MD   250 mg at 09/16/16 0818  . morphine 2 MG/ML injection 0.5 mg  0.5 mg Intravenous Q2H PRN Leandrew Koyanagi, MD      . naloxegol oxalate (MOVANTIK) tablet 25 mg  25 mg Oral Daily Brenton Grills, PA-C   25 mg at 09/16/16 0900  . ondansetron (ZOFRAN) injection 4 mg  4 mg Intravenous Q6H PRN Leandrew Koyanagi, MD   4 mg at 09/15/16 1159  . pantoprazole (PROTONIX) EC tablet 80 mg  80 mg Oral Q1200 Brenton Grills, PA-C   80 mg at 09/16/16 1118  . polyethylene glycol (MIRALAX / GLYCOLAX) packet 17 g  17 g Oral Daily Brenton Grills, PA-C   17 g at 09/16/16 0900  . pravastatin (PRAVACHOL) tablet 10 mg  10 mg Oral q1800 Brenton Grills, PA-C   10 mg at 09/15/16 1703  . tamsulosin (FLOMAX) capsule 0.4 mg  0.4 mg Oral Daily Thurnell Lose, MD   0.4 mg at 09/16/16 0900  . traZODone (DESYREL) tablet 25 mg  25 mg Oral QHS Brenton Grills, PA-C   25 mg at 09/15/16 2044  . vitamin B-12 (CYANOCOBALAMIN) tablet 2,000 mcg  2,000 mcg Oral Daily Brenton Grills, PA-C   2,000 mcg at 09/16/16 0900     Discharge Medications: Please see discharge summary for a list of discharge medications.  Relevant Imaging Results:  Relevant Lab Results:   Additional Information SSN: [facility] [id number] [place], [place]

## 2016-09-16 NOTE — Progress Notes (Signed)
Physical Therapy Treatment Patient Details Name: [name] MRN: [id number] DOB: 1940-12-23 Today's Date: 09/16/2016    History of Present Illness Pt is a 76 y.o. female with medical history significant of anemia, , COPD, degenerative disc disease of lumbar spine, C-spine fracture, hypertension, hyperlipidemia, GERD, depression, macular degeneration, and multiple strokes. She presented to the emergency department via EMS after she sustained a fall at home hitting head and right hip.  Xray revealed R hip fx. She underwent IM nailing 02-13-17.    PT Comments    Pt is making slow progress toward her goals. Pt is mod Ax2 for bed mobility and maxAx2 for 3 attempts of sit>stand without successfully completing due to R hip pain. Pt requires skilled PT at her discharge location to progress her bed mobility, transfers and ambulation to be able to safely navigate in her environment.     Follow Up Recommendations  SNF;Supervision/Assistance - 24 hour     Equipment Recommendations   (TBD at next venue)    Recommendations for Other Services OT consult     Precautions / Restrictions Precautions Precautions: Fall Restrictions Weight Bearing Restrictions: Yes RLE Weight Bearing: Weight bearing as tolerated    Mobility  Bed Mobility Overal bed mobility: Needs Assistance Bed Mobility: Supine to Sit     Supine to sit: Mod assist;+2 for physical assistance Sit to supine: Mod assist;+2 for physical assistance   General bed mobility comments: vc for sequencing, support for trunk and max A for BLE management from to and from ground   Transfers Overall transfer level: Needs assistance Equipment used: Rolling walker (2 wheeled) Transfers: Sit to/from [employer] Sit to Stand: Max assist;+2 physical assistance         General transfer comment: Pt unable to come all the way to upright despite 3 tries and raising the bed     Balance Overall balance assessment: Needs  assistance Sitting-balance support: Feet supported;Single extremity supported Sitting balance-Leahy Scale: Fair     Standing balance support: Bilateral upper extremity supported;During functional activity Standing balance-Leahy Scale: Zero                              Cognition Arousal/Alertness: Lethargic;Suspect due to medications Behavior During Therapy: Anxious Overall Cognitive Status: Impaired/Different from baseline Area of Impairment: Safety/judgement;Attention                   Current Attention Level: Sustained   Following Commands: Follows one step commands consistently Safety/Judgement: Decreased awareness of safety   Problem Solving: Slow processing        Exercises Total Joint Exercises Ankle Circles/Pumps: AROM;10 reps;Supine Quad Sets: AROM;10 reps;Supine Heel Slides: AAROM;5 reps;Right Hip ABduction/ADduction: AAROM;Right;5 reps;Supine Straight Leg Raises: AAROM;Right;5 reps;Supine        Pertinent Vitals/Pain Pain Assessment: 0-10 Pain Score: 10-Worst pain ever Pain Location: RLE Pain Descriptors / Indicators: Moaning;Grimacing;Guarding;Operative site guarding;Crying Pain Intervention(s): Limited activity within patient's tolerance;Monitored during session  VSS           PT Goals (current goals can now be found in the care plan section) Acute Rehab PT Goals Patient Stated Goal: not stated PT Goal Formulation: With patient Time For Goal Achievement: 09/28/16 Potential to Achieve Goals: Fair Progress towards PT goals: Progressing toward goals    Frequency    Min 3X/week      PT Plan Current plan remains appropriate       End of Session Equipment  Utilized During Treatment: Gait belt Activity Tolerance: Patient limited by pain Patient left: in bed;with call bell/phone within reach;with family/visitor present Nurse Communication: Patient requests pain meds;Mobility status PT Visit Diagnosis: Other abnormalities of  gait and mobility (R26.89);Pain Pain - Right/Left: Right Pain - part of body: Leg     Time: 6237-6283 PT Time Calculation (min) (ACUTE ONLY): 25 min  Charges:  $Therapeutic Activity: 23-37 mins                    G Codes:       Roshanna Cimino B. Beverely Risen PT, DPT Acute Rehabilitation  [phone] Pager [phone]     Elon Alas Fleet 09/16/2016, 11:53 AM

## 2016-09-17 NOTE — Consult Note (Signed)
           [facility] CM Primary Care Navigator  09/17/2016  [name] 08-31-1940 [id number]   Wentto see patient at the bedside to identify possible discharge needs but she was discharged to SNF (skilled nursing facility) per staff.  Chart review reveals that patient was discharged to [facility] skilled nursing facility yesterday for short term rehabilitation with hope to return back home.  Primary care provider's office called ([place])to notify of patient's discharge to skilled nursing facility and need for post discharge follow-up and transition of care.   Made aware to refer patient to [facility] care management ifdeemed appropriatefor services.    For questions, please contact:  Dannielle Huh, BSN, RN- [facility] Primary Care Navigator  Telephone: [phone] [place]

## 2016-09-19 ENCOUNTER — Telehealth: Payer: Self-pay

## 2016-09-19 NOTE — Telephone Encounter (Signed)
The Dr at the SNF needs to prescribe the medication , I don't manage pain meds while pt at [facility]

## 2016-09-19 NOTE — Telephone Encounter (Signed)
Patient has recently fallen and broken her hip .Since the fall she has had surgery. Patient  Is being moved to [facility] for rehab. Patients' daughter states "mom is an excruciating pain and would like more medication if possible, due to her pain." She is currently taking Norco 7.5/'325mg'$  and states that medication "isn't working currently". [name] would like a call back.  Please advise

## 2016-09-20 DIAGNOSIS — F325 Major depressive disorder, single episode, in full remission: Secondary | ICD-10-CM | POA: Insufficient documentation

## 2016-09-20 DIAGNOSIS — Z8673 Personal history of transient ischemic attack (TIA), and cerebral infarction without residual deficits: Secondary | ICD-10-CM | POA: Insufficient documentation

## 2016-09-20 DIAGNOSIS — M8000XD Age-related osteoporosis with current pathological fracture, unspecified site, subsequent encounter for fracture with routine healing: Secondary | ICD-10-CM | POA: Insufficient documentation

## 2016-09-20 NOTE — Telephone Encounter (Signed)
Contacted patient's daughter and informed that the doctor at the rehab center is responsible for patient's medication. Daughter acknowledged and understood

## 2016-09-23 ENCOUNTER — Telehealth: Payer: Self-pay | Admitting: *Deleted

## 2016-09-23 ENCOUNTER — Telehealth: Payer: Self-pay | Admitting: Physical Medicine & Rehabilitation

## 2016-09-23 NOTE — Telephone Encounter (Signed)
Daughter [name] called office - [place] where rehab is being done for broken hip needs patient med. [facility] in order to determine if they can change her pain pills as she is in severe pain.  Faxed med list over to [phone]

## 2016-09-23 NOTE — Telephone Encounter (Signed)
[name] called about her mother's pain level and the rehab facility will not override Dr Letta Pate medication since she is under pain management.  I told her that we can do nothing. She needs to contact the surgeon that did surgery and put her in rehab unit. She will call them.

## 2016-09-25 IMAGING — CT CT CERVICAL SPINE W/O CM
3 of 4 series · 13 of 33 positions shown, 16 images · non-contrast
Comparison: 10/05/2014

CLINICAL DATA: Recent mild acute compression fracture of C7
10/05/2014. Hit by a horse yesterday, worsening cervical neck pain.

EXAM:
CT CERVICAL SPINE WITHOUT CONTRAST
TECHNIQUE: Multidetector CT imaging of the cervical spine was performed without
intravenous contrast. Multiplanar CT image reconstructions were also
generated.

[Series 6: coronals · coronal · 0.25mm/px · 3 of 66 slices shown]
[im 14/66  bone]
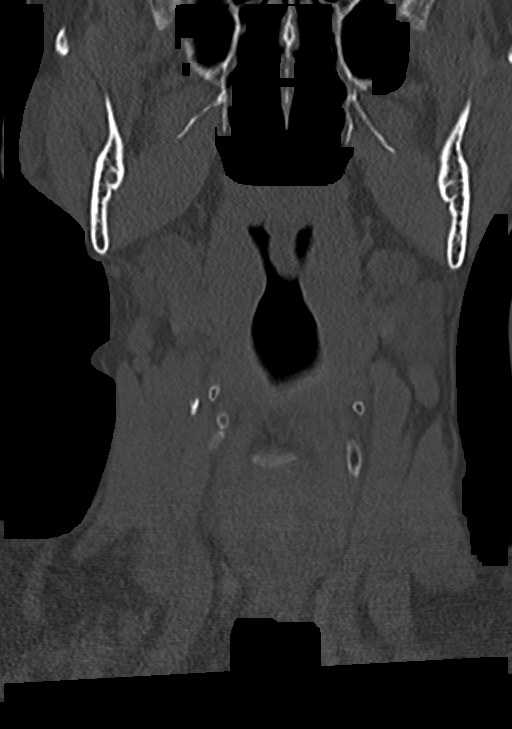
[im 27/66  bone]
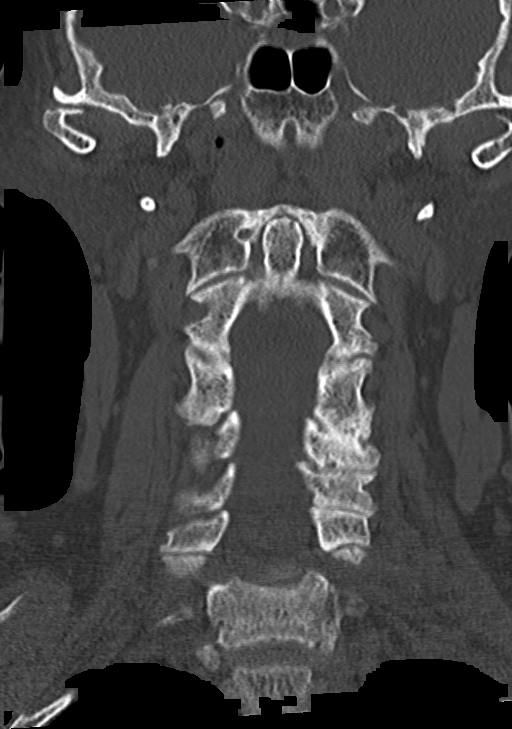
[im 40/66  bone]
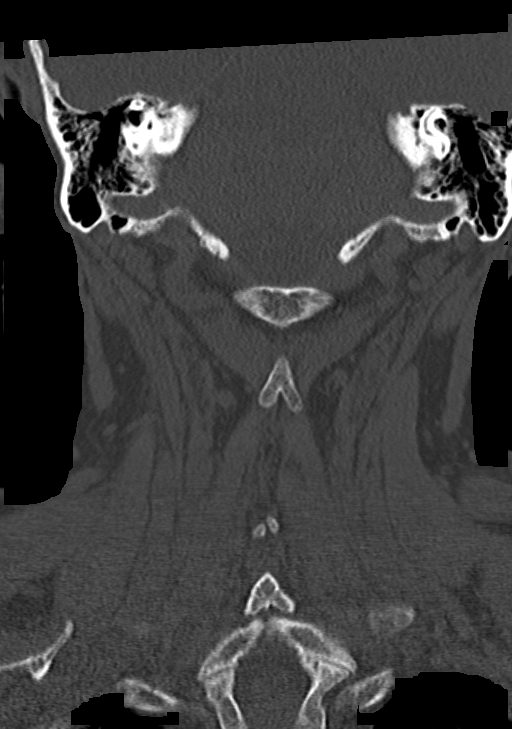

[Series 7: sagittals · sagittal · 0.30mm/px · 5 of 50 slices shown, 6 images]
[im 17/50  bone]
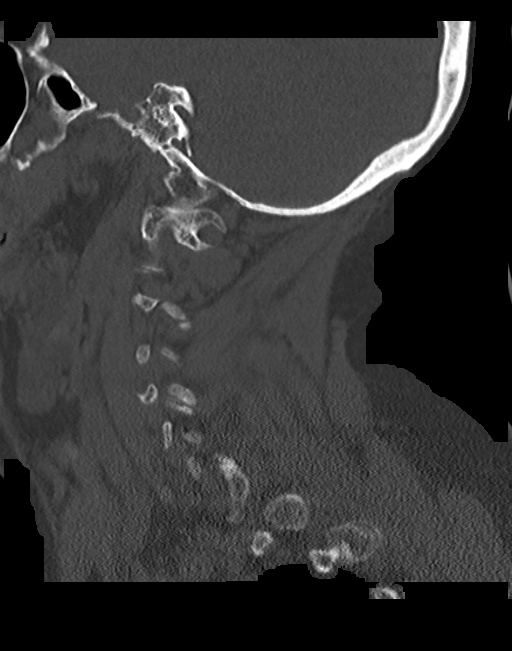
[im 21/50  bone]
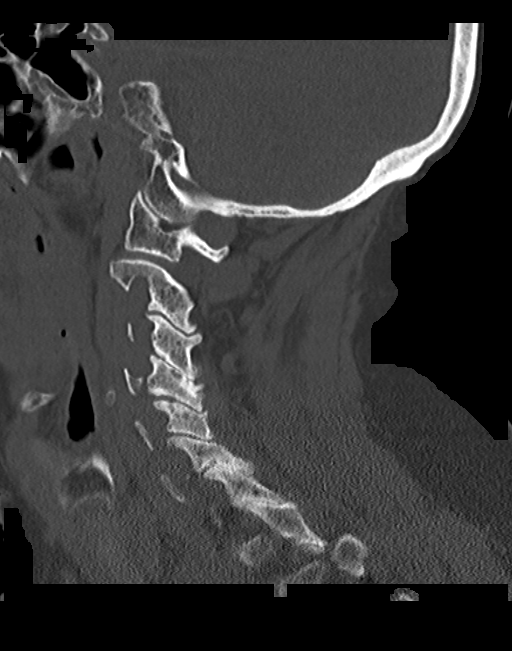
[im 25/50  soft-tissue]
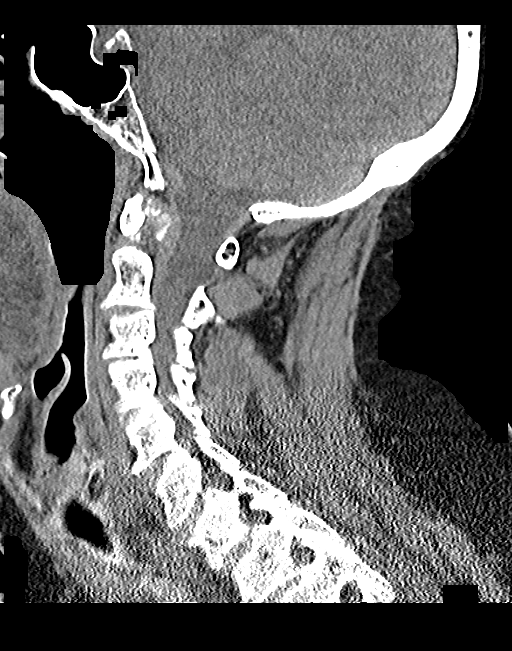
[im 25/50  bone]
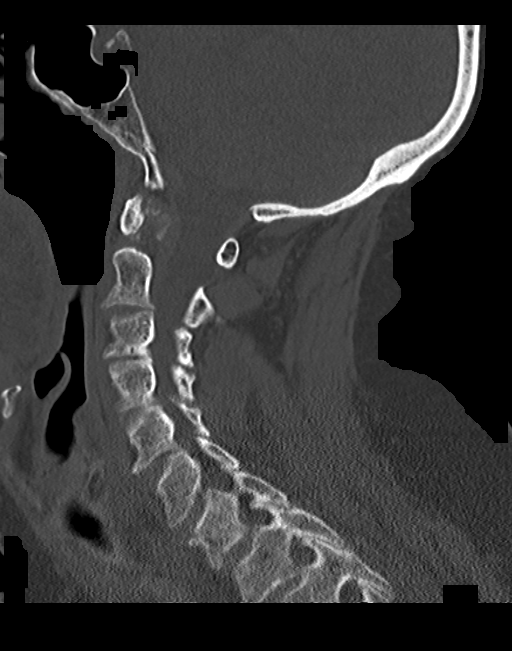
[im 29/50  bone]
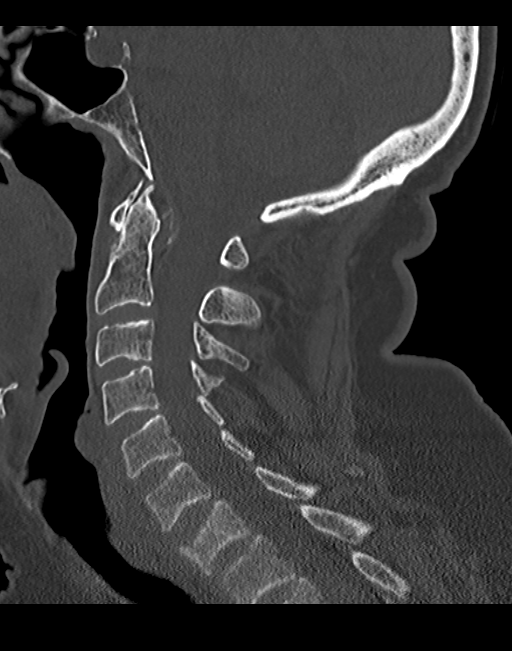
[im 33/50  bone]
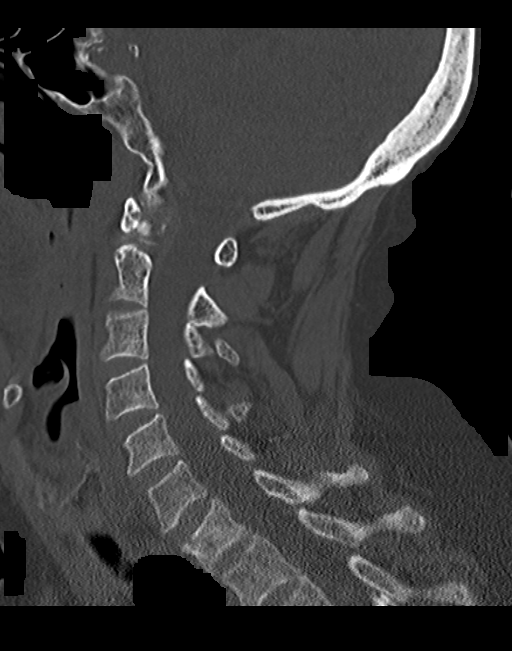

[Series 8: orthogonals · axial · 0.23mm/px · z∈[-282,-173]mm · 5 of 86 slices shown, 7 images]
[im 15/86  soft-tissue]
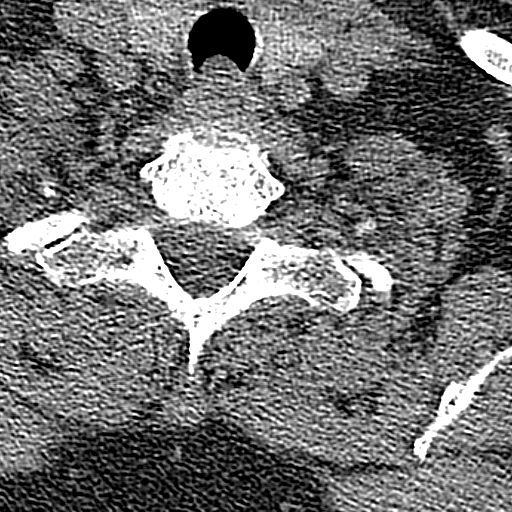
[im 15/86  bone]
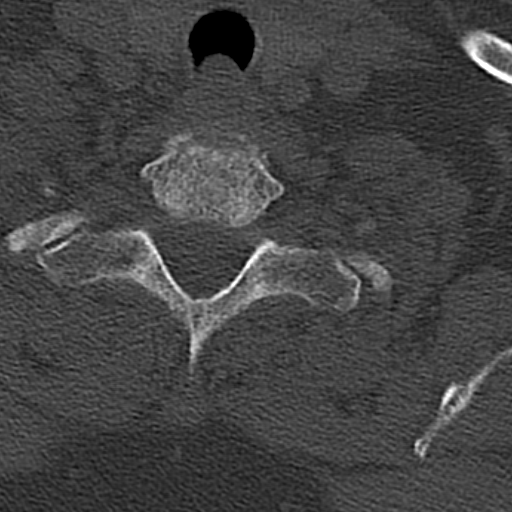
[im 29/86  bone]
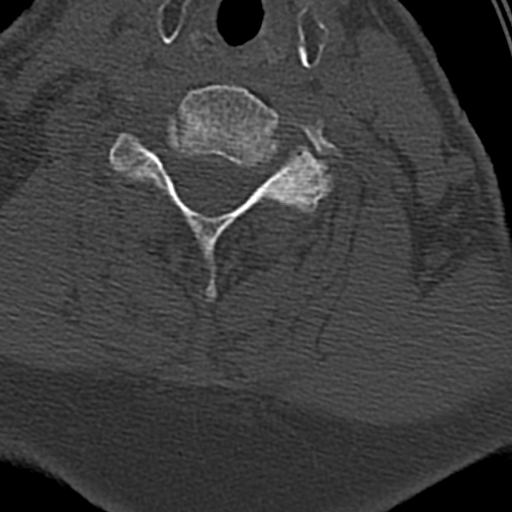
[im 43/86  bone]
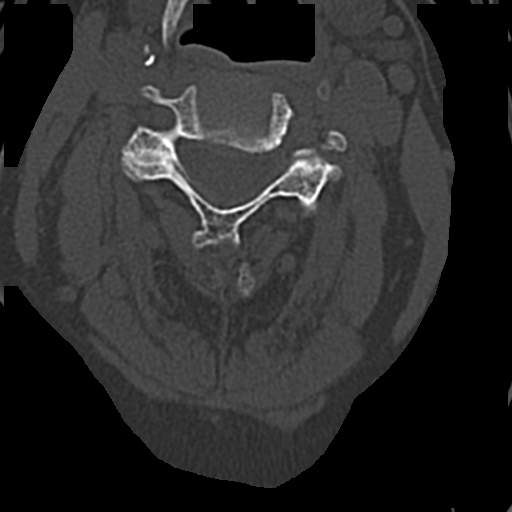
[im 57/86  bone]
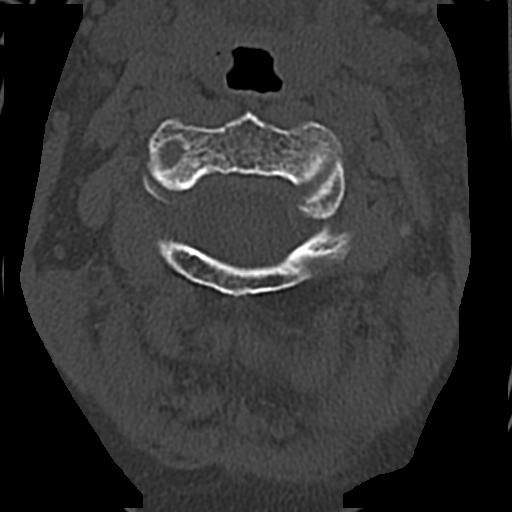
[im 71/86  soft-tissue]
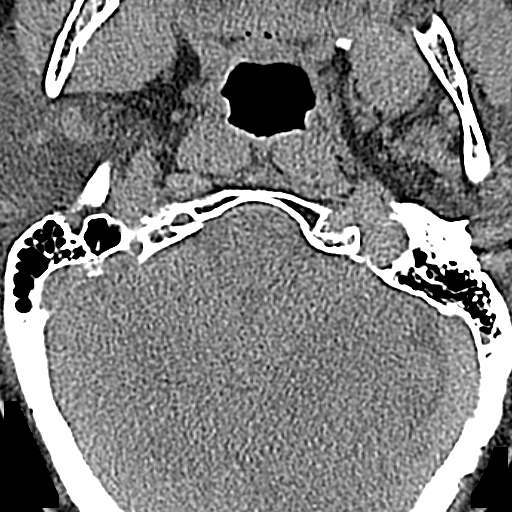
[im 71/86  bone]
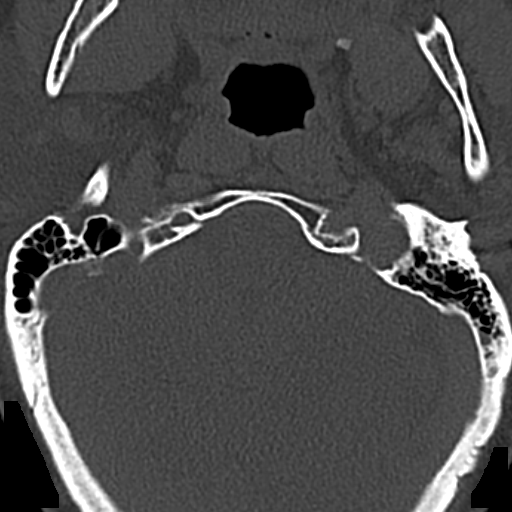

[13 of 33 positions shown; findings below may reference images not displayed]

FINDINGS: Stable cervical spine alignment and diffuse degenerative changes.
Facets are aligned. Diffuse facet arthropathy at multiple levels. No
Subluxation or dislocation. Degenerative changes of the C1-2
articulation. Normal prevertebral soft tissues. Compared to the
prior study, there is stable mild compression fracture of the C7
vertebral body. C7 vertebral body demonstrates increased sclerosis
compatible with interval healing. This is best appreciated on the
sagittal reconstructions. No new fracture demonstrated.

Lung apices are clear. No soft tissue asymmetry in the neck. Carotid
calcifications noted.
IMPRESSION: Stable mild compression fracture of C7 with evidence of increased
sclerosis indicative of healing. Fracture lines are less visible.

Stable degenerative changes.

No significant interval change or new fracture evident.

## 2016-09-26 ENCOUNTER — Other Ambulatory Visit: Payer: Self-pay | Admitting: *Deleted

## 2016-09-26 NOTE — Patient Outreach (Signed)
[name]) Care Management  09/26/2016  [name] 01-26-1941 [id number]   Met with patient at facility. Patient had recent fall with hip fracture. She also has history of COPD. Chronic risk score is 87.   RNCM reviewed [facility] care management program with patient and her daughter, [name], who was on speaker phone. Patient [name] lives with patient and manages her health, transportation and medications.  Patient declines program at this time but accepted brochure.   RNCM let SW know that patient did not sign up with [facility] today. She confirms that patient daughter very active in patient care. Also, that patient transferred from another SNF to this SNF.  Plan to sign off, patient and daughter have [name] brochure for future reference.  Royetta Crochet. Laymond Purser, RN, BSN, [place] [phone]) Business Cell  [phone]) Toll Free Office

## 2016-09-27 ENCOUNTER — Emergency Department
Admission: EM | Admit: 2016-09-27 | Discharge: 2016-09-27 | Disposition: A | Discharge status: Home / Self Care | Payer: Medicare Other | Attending: Emergency Medicine | Admitting: Emergency Medicine

## 2016-09-27 ENCOUNTER — Inpatient Hospital Stay (INDEPENDENT_AMBULATORY_CARE_PROVIDER_SITE_OTHER): Payer: Medicare Other | Admitting: Orthopaedic Surgery

## 2016-09-27 ENCOUNTER — Emergency Department: Payer: Medicare Other

## 2016-09-27 ENCOUNTER — Encounter: Payer: Self-pay | Admitting: Emergency Medicine

## 2016-09-27 ENCOUNTER — Other Ambulatory Visit: Payer: Self-pay | Admitting: Registered Nurse

## 2016-09-27 DIAGNOSIS — J45909 Unspecified asthma, uncomplicated: Secondary | ICD-10-CM | POA: Diagnosis not present

## 2016-09-27 DIAGNOSIS — M25561 Pain in right knee: Secondary | ICD-10-CM | POA: Insufficient documentation

## 2016-09-27 DIAGNOSIS — Z79899 Other long term (current) drug therapy: Secondary | ICD-10-CM | POA: Diagnosis not present

## 2016-09-27 DIAGNOSIS — Z7901 Long term (current) use of anticoagulants: Secondary | ICD-10-CM | POA: Insufficient documentation

## 2016-09-27 DIAGNOSIS — I10 Essential (primary) hypertension: Secondary | ICD-10-CM | POA: Insufficient documentation

## 2016-09-27 DIAGNOSIS — Z87891 Personal history of nicotine dependence: Secondary | ICD-10-CM | POA: Insufficient documentation

## 2016-09-27 DIAGNOSIS — J449 Chronic obstructive pulmonary disease, unspecified: Secondary | ICD-10-CM | POA: Insufficient documentation

## 2016-09-27 DIAGNOSIS — G8918 Other acute postprocedural pain: Secondary | ICD-10-CM | POA: Diagnosis present

## 2016-09-27 MED ORDER — BACLOFEN 10 MG PO TABS: 10.0000 mg | ORAL_TABLET | Freq: Three times a day (TID) | ORAL | 0 refills | Status: DC

## 2016-09-27 MED ORDER — LIDOCAINE 4 % EX PTCH: 1.0000 | MEDICATED_PATCH | Freq: Every day | CUTANEOUS | 0 refills | Status: DC

## 2016-09-27 MED ORDER — BACLOFEN 10 MG PO TABS
10.0000 mg | ORAL_TABLET | Freq: Once | ORAL | Status: AC
  Administered 2016-09-27: 10 mg via ORAL
  Filled 2016-09-27: qty 1

## 2016-09-27 NOTE — ED Notes (Signed)
X-ray at bedside

## 2016-09-27 NOTE — ED Provider Notes (Signed)
[facility] Emergency Department Provider Note ____________________________________________  Time seen: Approximately 12:20 PM  I have reviewed the triage vital signs and the nursing notes.   HISTORY  Chief Complaint Knee Pain    HPI [name] is a 76 y.o. female who presents to the emergency department for evaluation of right knee pain. Patient had repair of an intertrochanteric fracture with an intramedullary implant on 09/13/2016. She has been residing at [facility] for rehabilitation since being discharged. Patient states that since surgery, she has had pain in her right knee which has prevented her from going through with the rehabilitation exercises and walking even with assistance. She also states that she does not want to return to [facility] and would like to go to her daughter's house instead when she is discharged today. She is unsure what she has been given for pain, but states that whenever it is does not work for her knee pain.  Past Medical History:  Diagnosis Date  . Anemia   . B12 deficiency   . Bladder incontinence   . Blind left eye   . Cataract   . Cervical spine fracture ([place])   . Compression fracture    C7,  upper T spine -compression fx  . COPD (chronic obstructive pulmonary disease) ([place])   . DDD (degenerative disc disease), lumbar   . Depression    major  . GERD (gastroesophageal reflux disease)   . Hiatal hernia   . Hyperlipidemia   . Hypertension   . Macular degeneration   . Osteoarthritis   . Osteoporosis   . Reactive airway disease   . Rupture of bowel ([place])   . Stroke [facility])    "mini stroke"  . Stroke [facility])     Patient Active Problem List   Diagnosis Date Noted  . Hip fracture ([place]) 09/13/2016  . Displaced intertrochanteric fracture of right femur, initial encounter for closed fracture ([place]) 09/13/2016  . Fall   . Chronic pain of both shoulders 08/12/2016  . Spinal stenosis in cervical region  06/27/2015  . Syncope 05/22/2015  . Diffuse myofascial pain syndrome 03/24/2015  . Sepsis ([place]) 10/08/2014  . Bacteremia 10/07/2014  . Leukopenia 10/07/2014  . Thrombocytopenia ([place]) 10/07/2014  . Fever 10/05/2014  . Hypoxia 10/05/2014  . HLD (hyperlipidemia) 07/30/2013  . HTN (hypertension) 07/30/2013  . GERD (gastroesophageal reflux disease) 07/30/2013  . Chest pain 07/29/2013  . Dizziness and giddiness 06/18/2013  . Gait difficulty 05/03/2013  . Memory loss 05/03/2013    Past Surgical History:  Procedure Laterality Date  . ABDOMINAL HYSTERECTOMY    . ABDOMINAL SURGERY    . APPENDECTOMY    . BLADDER SURGERY    . CHOLECYSTECTOMY    . COLON SURGERY    . EYE SURGERY     cataracts  . INTRAMEDULLARY (IM) NAIL INTERTROCHANTERIC Right 09/13/2016   Procedure: INTRAMEDULLARY (IM) NAIL INTERTROCHANTRIC RIGHT;  Surgeon: Leandrew Koyanagi, MD;  Location: [place];  Service: Orthopedics;  Laterality: Right;  . JOINT REPLACEMENT    . KNEE SURGERY    . OSTOMY    . RECTOCELE REPAIR      Prior to Admission medications   Medication Sig Start Date End Date Taking? Authorizing Provider  acetaminophen (TYLENOL) 325 MG tablet Take 2 tablets (650 mg total) by mouth every 6 (six) hours as needed for mild pain (or Fever >/= 101). Patient not taking: Reported on 09/13/2016 05/24/15   Geradine Girt, DO  albuterol (PROVENTIL HFA;VENTOLIN HFA)  108 (90 BASE) MCG/ACT inhaler Inhale 1 puff into the lungs every 6 (six) hours as needed for wheezing or shortness of breath.    [provider]  albuterol (PROVENTIL) (2.5 MG/3ML) 0.083% nebulizer solution Take 2.5 mg by nebulization every 6 (six) hours as needed for wheezing or shortness of breath.    [provider]  ALPRAZolam Duanne Moron) 1 MG tablet Take 1 tablet (1 mg total) by mouth at bedtime. 09/15/16   Thurnell Lose, MD  amLODipine (NORVASC) 5 MG tablet Take 5 mg by mouth daily.  04/06/13   [provider]  baclofen (LIORESAL) 10 MG  tablet Take 1 tablet (10 mg total) by mouth 3 (three) times daily. 09/27/16   Anselm Aumiller B, FNP  busPIRone (BUSPAR) 5 MG tablet Take 5 mg by mouth 2 (two) times daily. 09/11/16   [provider]  citalopram (CELEXA) 20 MG tablet Take 30 mg by mouth daily.     [provider]  enoxaparin (LOVENOX) 40 MG/0.4ML injection Inject 0.4 mLs (40 mg total) into the skin daily. 09/13/16   Leandrew Koyanagi, MD  esomeprazole (NEXIUM) 40 MG capsule Take 40 mg by mouth 2 (two) times daily before a meal.     [provider]  gabapentin (NEURONTIN) 400 MG capsule TAKE 1 CAPSULE BY MOUTH FOUR TIMES A DAY 11/24/15   Kirsteins, Luanna Salk, MD  HYDROcodone-acetaminophen (NORCO) 7.5-325 MG tablet Take 1 tablet by mouth 3 (three) times daily. 09/15/16   Thurnell Lose, MD  IRON PO Take 1 tablet by mouth 2 (two) times a week.     [provider]  Lidocaine 4 % PTCH Apply 1 patch topically daily. 09/27/16   Naliah Eddington B, FNP  lovastatin (MEVACOR) 10 MG tablet Take 10 mg by mouth daily. 09/22/14   [provider]  meloxicam (MOBIC) 15 MG tablet Take 15 mg by mouth every evening. 05/23/16   [provider]  methocarbamol (ROBAXIN) 500 MG tablet Take 250 mg by mouth 2 (two) times daily. 09/10/16   [provider]  naloxegol oxalate (MOVANTIK) 25 MG TABS tablet Take 1 tablet (25 mg total) by mouth daily. 06/14/16   Bayard Hugger, NP  ondansetron (ZOFRAN) 4 MG tablet Take 4 mg by mouth every 8 (eight) hours as needed for nausea.  02/29/16   [provider]  polyethylene glycol powder (GLYCOLAX/MIRALAX) powder Take 17 g by mouth daily. For constipation    [provider]  tamsulosin (FLOMAX) 0.4 MG CAPS capsule Take 1 capsule (0.4 mg total) by mouth daily. 09/16/16   Thurnell Lose, MD  traZODone (DESYREL) 50 MG tablet Take 0.5 tablets (25 mg total) by mouth at bedtime. 12/30/14   Penumalli, Earlean Polka, MD  vitamin B-12 (CYANOCOBALAMIN) 1000 MCG tablet  Take 2,000 mcg by mouth daily.     [provider]  VOLTAREN 1 % GEL Apply 2 g topically 4 (four) times daily as needed (for pain).  03/29/16   [provider]    Allergies Ampicillin-sulbactam sodium; Ampicillin; Toradol [ketorolac tromethamine]; Tramadol; Ambien [zolpidem tartrate]; Cephalexin; Ketorolac; Percocet [oxycodone-acetaminophen]; Sulfamethoxazole-trimethoprim; and Tape  Family History  Problem Relation Age of Onset  . Heart failure Mother   . Heart attack Father     Social History Social History  Substance Use Topics  . Smoking status: Former Smoker    Packs/day: 1.50    Years: 30.00  . Smokeless tobacco: Never Used     Comment: quit 2013  .  Alcohol use No    Review of Systems Constitutional: No recent illness. Cardiovascular: Denies chest pain or palpitations. Respiratory: Denies shortness of breath. Musculoskeletal: Pain in Right knee Skin: Negative for rash, wound, lesion. Neurological: Negative for focal weakness or numbness.  ____________________________________________   PHYSICAL EXAM:  VITAL SIGNS: ED Triage Vitals  Enc Vitals Group     BP 09/27/16 1115 (!) 142/86     Pulse Rate 09/27/16 1115 97     Resp 09/27/16 1115 18     Temp 09/27/16 1115 99.2 F (37.3 C)     Temp Source 09/27/16 1115 Oral     SpO2 09/27/16 1115 94 %     Weight 09/27/16 1116 180 lb (81.6 kg)     Height 09/27/16 1116 '5\' 6"'$  (1.676 m)     Head Circumference --      Peak Flow --      Pain Score 09/27/16 1115 10     Pain Loc --      Pain Edu? --      Excl. in [place]? --     Constitutional: Alert and oriented. Well appearing and in no acute distress. Eyes: Conjunctivae are normal. EOMI. Head: Atraumatic. Neck: No stridor.  Respiratory: Normal respiratory effort.   Musculoskeletal: Patient unwilling to attempt range of motion with the right knee. Right hip range of motion is slightly decreased, but surprisingly fluid just a couple weeks,  surgery. Neurologic:  Normal speech and language. No gross focal neurologic deficits are appreciated. Speech is normal. No gait instability. Skin:  Skin is warm, dry and intact. Atraumatic. Surgical wound of the right hip is without concern of infection. Psychiatric: Mood and affect are normal. Speech and behavior are normal.  ____________________________________________   LABS (all labs ordered are listed, but only abnormal results are displayed)  Labs Reviewed - No data to display ____________________________________________  RADIOLOGY  Right knee is negative for acute bony abnormality per radiology. I, Sherrie George, personally viewed and evaluated these images (plain radiographs) as part of my medical decision making, as well as reviewing the written report by the radiologist.  ___________________________________________   PROCEDURES  Procedure(s) performed: None  ____________________________________________   INITIAL IMPRESSION / [place] / ED COURSE  76 year old female presenting to the emergency department for right knee pain. No acute bony abnormality on x-ray today. Symptoms likely consistent with positioning during surgery and altered gait. States she will be prescribed baclofen and lidocaine patches. She was instructed to call and schedule follow-up appointment with her orthopedist. She was instructed to return to the emergency department for symptoms that change or worsen if she is unable schedule an appointment. Nursing staff to clarify process and protocol of either returning patient to [facility] and or allowing her to go back to her daughter's home.  Pertinent labs & imaging results that were available during my care of the patient were reviewed by me and considered in my medical decision making (see chart for details).  _________________________________________   FINAL CLINICAL IMPRESSION(S) / ED DIAGNOSES  Final diagnoses:  Acute pain of  right knee    Discharge Medication List as of 09/27/2016  2:57 PM    START taking these medications   Details  baclofen (LIORESAL) 10 MG tablet Take 1 tablet (10 mg total) by mouth 3 (three) times daily., Starting Fri 09/27/2016, Print    Lidocaine 4 % PTCH Apply 1 patch topically daily., Starting Fri 09/27/2016, Print        If controlled substance  prescribed during this visit, 12 month history viewed on the [place] prior to issuing an initial prescription for Schedule II or III opiod.    Victorino Dike, FNP 09/28/16 1210    Orbie Pyo, MD 09/29/16 [phone]

## 2016-09-27 NOTE — ED Triage Notes (Signed)
Pt arrived via EMS for reports of right knee pain. Pt reports broke her hip two weeks ago and had surgical repair. Pt reports knee has been hurting since the hip injury.

## 2016-09-27 NOTE — ED Notes (Signed)
Called for transport

## 2016-09-27 NOTE — ED Notes (Signed)
Patient and daughter refusing to be transported back to [facility] facility and want EMS transport home.  Patient and daughter informed that insurance will not cover EMS transport fee without being properly discharged from [facility].  All parties agreed upon information.

## 2016-09-27 NOTE — Telephone Encounter (Signed)
Recieved electronic medication refill request for movantik, no mention in previous notes to continue this medicine, please advise

## 2016-09-30 ENCOUNTER — Inpatient Hospital Stay (INDEPENDENT_AMBULATORY_CARE_PROVIDER_SITE_OTHER): Payer: Medicare Other | Admitting: Orthopaedic Surgery

## 2016-10-01 ENCOUNTER — Ambulatory Visit (INDEPENDENT_AMBULATORY_CARE_PROVIDER_SITE_OTHER): Payer: Medicare Other

## 2016-10-01 ENCOUNTER — Telehealth: Payer: Self-pay | Admitting: *Deleted

## 2016-10-01 ENCOUNTER — Encounter (INDEPENDENT_AMBULATORY_CARE_PROVIDER_SITE_OTHER): Payer: Self-pay | Admitting: Orthopaedic Surgery

## 2016-10-01 ENCOUNTER — Ambulatory Visit (INDEPENDENT_AMBULATORY_CARE_PROVIDER_SITE_OTHER): Payer: Medicare Other | Admitting: Orthopaedic Surgery

## 2016-10-01 DIAGNOSIS — M25551 Pain in right hip: Secondary | ICD-10-CM

## 2016-10-01 DIAGNOSIS — S72141A Displaced intertrochanteric fracture of right femur, initial encounter for closed fracture: Secondary | ICD-10-CM

## 2016-10-01 MED ORDER — METHOCARBAMOL 500 MG PO TABS: 500.0000 mg | ORAL_TABLET | Freq: Four times a day (QID) | ORAL | 2 refills | Status: DC | PRN

## 2016-10-01 MED ORDER — OXYCODONE-ACETAMINOPHEN 5-325 MG PO TABS: 1.0000 | ORAL_TABLET | Freq: Three times a day (TID) | ORAL | 0 refills | Status: DC | PRN

## 2016-10-01 NOTE — Progress Notes (Signed)
Ms. Foxworthy is a 76 year old female whose 2 weeks status post intramedullary fixation of an atrophic hip fracture. She is known well. She did have a fall recently onto her right knee. She's complaining of mainly pain over the hamstrings. Her x-rays were negative of the knee. Her incisions are healed today. The staples were removed. X-ray show stable fixation and alignment of the fracture. At this point continue with home health physical therapy for strengthening and mobilization. I'll see her back in 6 weeks for recheck and 2 view x-rays of the right hip.

## 2016-10-01 NOTE — Telephone Encounter (Signed)
Discussed discharge with Mrs [name] who is on her DPR.  She is being discharged for frequent and multiple cancellations. Mrs Willhelm has been under the care of another physician while in rehab who has been writing prescriptions.

## 2016-10-02 ENCOUNTER — Telehealth (INDEPENDENT_AMBULATORY_CARE_PROVIDER_SITE_OTHER): Payer: Self-pay | Admitting: Orthopaedic Surgery

## 2016-10-02 NOTE — Telephone Encounter (Signed)
PT DAUGHTER WANTS TO KNOW IF PT STILL NEEDS TO BE ON HER BLOOD THINNER AND ANTIBIOTIC.  CATHY [phone]

## 2016-10-03 ENCOUNTER — Ambulatory Visit: Payer: Medicare Other | Admitting: Physical Medicine & Rehabilitation

## 2016-10-03 NOTE — Telephone Encounter (Signed)
Please advise 

## 2016-10-03 NOTE — Telephone Encounter (Signed)
no

## 2016-10-04 NOTE — Telephone Encounter (Signed)
Called to advise pts daughter called to see what was the hold up with HHPT I am not sure but I did faxed new order to [facility] with all her info so they should call her

## 2016-10-11 ENCOUNTER — Telehealth (INDEPENDENT_AMBULATORY_CARE_PROVIDER_SITE_OTHER): Payer: Self-pay | Admitting: Orthopaedic Surgery

## 2016-10-11 NOTE — Telephone Encounter (Signed)
Phys therapist Erline Levine called and stated she is having issues getting pt pain managed. She is having a difficult time with PT for pt because she is hurting so bad.   [phone]

## 2016-10-11 NOTE — Telephone Encounter (Signed)
Does she need any pain meds?

## 2016-10-11 NOTE — Telephone Encounter (Signed)
See message.

## 2016-10-14 NOTE — Telephone Encounter (Signed)
Called therapist back no answer, LMOM - is patient needing pain meds?

## 2016-10-22 ENCOUNTER — Ambulatory Visit (INDEPENDENT_AMBULATORY_CARE_PROVIDER_SITE_OTHER): Payer: Medicare Other | Admitting: Orthopaedic Surgery

## 2016-10-22 ENCOUNTER — Ambulatory Visit (INDEPENDENT_AMBULATORY_CARE_PROVIDER_SITE_OTHER): Payer: Medicare Other

## 2016-10-22 DIAGNOSIS — S72141A Displaced intertrochanteric fracture of right femur, initial encounter for closed fracture: Secondary | ICD-10-CM

## 2016-10-22 MED ORDER — OXYCODONE-ACETAMINOPHEN 5-325 MG PO TABS: 1.0000 | ORAL_TABLET | Freq: Three times a day (TID) | ORAL | 0 refills | Status: DC | PRN

## 2016-10-22 NOTE — Progress Notes (Signed)
Mrs. Dombeck is 4 weeks status post right antral hip fracture. She is mainly complaining of knee pain. She does have a knee replacement. She is on chronic pain medicines for her chronic neck and back pain. She is very anxious and has very disorganized thoughts. My exam her scars are fully healed. She has no pain with range of motion. He does have quad weakness. There is no knee joint effusion. The scar is fully healed. There is no warmth to the knee. X-rays show stable fixation. I told him that I feel the pain is not actually coming from the knee but referred pain from the hip and from quad weakness. Patient is on chronic pain medicines. I recommend continuing with aggressive physical therapy. Percocet was refilled. Follow-up in 6 weeks for recheck. 2 view x-rays of the right hip.

## 2016-10-23 ENCOUNTER — Emergency Department (HOSPITAL_COMMUNITY): Payer: Medicare Other

## 2016-10-23 ENCOUNTER — Encounter (HOSPITAL_COMMUNITY): Payer: Self-pay | Admitting: Emergency Medicine

## 2016-10-23 ENCOUNTER — Emergency Department (HOSPITAL_COMMUNITY)
Admission: EM | Admit: 2016-10-23 | Discharge: 2016-10-23 | Disposition: A | Discharge status: Home / Self Care | Payer: Medicare Other | Attending: Emergency Medicine | Admitting: Emergency Medicine

## 2016-10-23 DIAGNOSIS — M791 Myalgia: Secondary | ICD-10-CM | POA: Insufficient documentation

## 2016-10-23 DIAGNOSIS — R52 Pain, unspecified: Secondary | ICD-10-CM

## 2016-10-23 DIAGNOSIS — R0789 Other chest pain: Secondary | ICD-10-CM | POA: Diagnosis not present

## 2016-10-23 DIAGNOSIS — Z87891 Personal history of nicotine dependence: Secondary | ICD-10-CM | POA: Insufficient documentation

## 2016-10-23 DIAGNOSIS — Z79899 Other long term (current) drug therapy: Secondary | ICD-10-CM | POA: Insufficient documentation

## 2016-10-23 DIAGNOSIS — R079 Chest pain, unspecified: Secondary | ICD-10-CM | POA: Diagnosis present

## 2016-10-23 DIAGNOSIS — J449 Chronic obstructive pulmonary disease, unspecified: Secondary | ICD-10-CM | POA: Insufficient documentation

## 2016-10-23 DIAGNOSIS — R1010 Upper abdominal pain, unspecified: Secondary | ICD-10-CM | POA: Diagnosis not present

## 2016-10-23 DIAGNOSIS — I10 Essential (primary) hypertension: Secondary | ICD-10-CM | POA: Diagnosis not present

## 2016-10-23 DIAGNOSIS — Z96698 Presence of other orthopedic joint implants: Secondary | ICD-10-CM | POA: Diagnosis not present

## 2016-10-23 LAB — CBC WITH DIFFERENTIAL/PLATELET
Basophils Absolute: 0 10*3/uL (ref 0.0–0.1)
Basophils Relative: 0 %
Eosinophils Absolute: 0.3 10*3/uL (ref 0.0–0.7)
Eosinophils Relative: 3 %
HCT: 39.7 % (ref 36.0–46.0)
Hemoglobin: 12.3 g/dL (ref 12.0–15.0)
Lymphocytes Relative: 25 %
Lymphs Abs: 2.1 10*3/uL (ref 0.7–4.0)
MCH: 29.4 pg (ref 26.0–34.0)
MCHC: 31 g/dL (ref 30.0–36.0)
MCV: 95 fL (ref 78.0–100.0)
Monocytes Absolute: 1.1 10*3/uL — ABNORMAL HIGH (ref 0.1–1.0)
Monocytes Relative: 13 %
Neutro Abs: 4.9 10*3/uL (ref 1.7–7.7)
Neutrophils Relative %: 59 %
Platelets: 205 10*3/uL (ref 150–400)
RBC: 4.18 MIL/uL (ref 3.87–5.11)
RDW: 14.6 % (ref 11.5–15.5)
WBC: 8.1 10*3/uL (ref 4.0–10.5)

## 2016-10-23 LAB — URINALYSIS, ROUTINE W REFLEX MICROSCOPIC
Bilirubin Urine: NEGATIVE
Glucose, UA: NEGATIVE mg/dL
Hgb urine dipstick: NEGATIVE
Ketones, ur: NEGATIVE mg/dL
Nitrite: NEGATIVE
Protein, ur: NEGATIVE mg/dL
Specific Gravity, Urine: 1.011 (ref 1.005–1.030)
pH: 8 (ref 5.0–8.0)

## 2016-10-23 LAB — I-STAT TROPONIN, ED
Troponin i, poc: 0 ng/mL (ref 0.00–0.08)
Troponin i, poc: 0.01 ng/mL (ref 0.00–0.08)

## 2016-10-23 LAB — BASIC METABOLIC PANEL
Anion gap: 5 (ref 5–15)
BUN: 9 mg/dL (ref 6–20)
CO2: 29 mmol/L (ref 22–32)
Calcium: 9.5 mg/dL (ref 8.9–10.3)
Chloride: 104 mmol/L (ref 101–111)
Creatinine, Ser: 1.22 mg/dL — ABNORMAL HIGH (ref 0.44–1.00)
GFR calc Af Amer: 49 mL/min — ABNORMAL LOW (ref >60)
GFR calc non Af Amer: 42 mL/min — ABNORMAL LOW (ref >60)
Glucose, Bld: 95 mg/dL (ref 65–99)
Potassium: 3.8 mmol/L (ref 3.5–5.1)
Sodium: 138 mmol/L (ref 135–145)

## 2016-10-23 LAB — D-DIMER, QUANTITATIVE: D-Dimer, Quant: 1.71 ug/mL-FEU — ABNORMAL HIGH (ref 0.00–0.50)

## 2016-10-23 MED ORDER — LORAZEPAM 2 MG/ML IJ SOLN
0.5000 mg | Freq: Once | INTRAMUSCULAR | Status: AC
  Administered 2016-10-23: 0.5 mg via INTRAVENOUS
  Filled 2016-10-23: qty 1

## 2016-10-23 MED ORDER — CIPROFLOXACIN HCL 500 MG PO TABS: 500.0000 mg | ORAL_TABLET | Freq: Two times a day (BID) | ORAL | 0 refills | Status: DC

## 2016-10-23 MED ORDER — HYDROMORPHONE HCL 1 MG/ML IJ SOLN
1.0000 mg | Freq: Once | INTRAMUSCULAR | Status: AC
  Administered 2016-10-23: 1 mg via INTRAVENOUS
  Filled 2016-10-23: qty 1

## 2016-10-23 MED ORDER — FENTANYL CITRATE (PF) 100 MCG/2ML IJ SOLN
100.0000 ug | Freq: Once | INTRAMUSCULAR | Status: DC
Start: 2016-10-23 — End: 2016-10-23

## 2016-10-23 MED ORDER — IOPAMIDOL (ISOVUE-370) INJECTION 76%
INTRAVENOUS | Status: AC
  Administered 2016-10-23: 50 mL
  Filled 2016-10-23: qty 100

## 2016-10-23 MED ORDER — ACETAMINOPHEN 325 MG PO TABS: 650.0000 mg | ORAL_TABLET | Freq: Once | ORAL | Status: DC

## 2016-10-23 MED ORDER — SODIUM CHLORIDE 0.9 % IV BOLUS (SEPSIS)
1000.0000 mL | Freq: Once | INTRAVENOUS | Status: AC
  Administered 2016-10-23: 1000 mL via INTRAVENOUS

## 2016-10-23 MED ORDER — LORAZEPAM 2 MG/ML IJ SOLN: 1.0000 mg | Freq: Once | INTRAMUSCULAR | Status: DC

## 2016-10-23 MED ORDER — FENTANYL CITRATE (PF) 100 MCG/2ML IJ SOLN
50.0000 ug | Freq: Once | INTRAMUSCULAR | Status: AC
  Administered 2016-10-23: 50 ug via INTRAVENOUS
  Filled 2016-10-23: qty 2

## 2016-10-23 NOTE — ED Provider Notes (Signed)
[facility] DEPT MHP Provider Note   CSN: [id number] Arrival date & time: 10/23/16  0449     History   Chief Complaint Chief Complaint  Patient presents with  . Back Pain  . Flank Pain  . Knee Pain  . Rib pain    HPI [name] is a 76 y.o. female.Female with a history of chronic pain, I feel  recent hip fracture with ORIF on 09/13/2016 by Dr Erlinda Hong. The patient presents today with chief complaint of severe chest pain in her upper back that radiates to the front with associated pleuritic pain. She had sudden onset of this pain last night. She took one of her oxycodone and was unable to receive relief. The patient states that she has never had pain like this before. She denies any new traumas or falls. The patient has been doing rehabilitation at home with her daughter but has been less active. She does not currently smoke but does have a previous history of smoking.  HPI  Past Medical History:  Diagnosis Date  . Anemia   . B12 deficiency   . Bladder incontinence   . Blind left eye   . Cataract   . Cervical spine fracture ([place])   . Compression fracture    C7,  upper T spine -compression fx  . COPD (chronic obstructive pulmonary disease) ([place])   . DDD (degenerative disc disease), lumbar   . Depression    major  . GERD (gastroesophageal reflux disease)   . Hiatal hernia   . Hyperlipidemia   . Hypertension   . Macular degeneration   . Osteoarthritis   . Osteoporosis   . Reactive airway disease   . Rupture of bowel ([place])   . Stroke [facility])    "mini stroke"  . Stroke [facility])     Patient Active Problem List   Diagnosis Date Noted  . Hip fracture ([place]) 09/13/2016  . Displaced intertrochanteric fracture of right femur, initial encounter for closed fracture ([place]) 09/13/2016  . Fall   . Chronic pain of both shoulders 08/12/2016  . Spinal stenosis in cervical region 06/27/2015  . Syncope 05/22/2015  . Diffuse myofascial pain syndrome 03/24/2015  . Sepsis ([place])  10/08/2014  . Bacteremia 10/07/2014  . Leukopenia 10/07/2014  . Thrombocytopenia ([place]) 10/07/2014  . Fever 10/05/2014  . Hypoxia 10/05/2014  . HLD (hyperlipidemia) 07/30/2013  . HTN (hypertension) 07/30/2013  . GERD (gastroesophageal reflux disease) 07/30/2013  . Chest pain 07/29/2013  . Dizziness and giddiness 06/18/2013  . Gait difficulty 05/03/2013  . Memory loss 05/03/2013    Past Surgical History:  Procedure Laterality Date  . ABDOMINAL HYSTERECTOMY    . ABDOMINAL SURGERY    . APPENDECTOMY    . BLADDER SURGERY    . CHOLECYSTECTOMY    . COLON SURGERY    . EYE SURGERY     cataracts  . INTRAMEDULLARY (IM) NAIL INTERTROCHANTERIC Right 09/13/2016   Procedure: INTRAMEDULLARY (IM) NAIL INTERTROCHANTRIC RIGHT;  Surgeon: Leandrew Koyanagi, MD;  Location: [facility];  Service: Orthopedics;  Laterality: Right;  . JOINT REPLACEMENT    . KNEE SURGERY    . OSTOMY    . RECTOCELE REPAIR      OB History    Gravida Para Term Preterm AB Living   0 0 0 0 0     SAB TAB Ectopic Multiple Live Births   0 0 0           Home Medications    Prior to Admission  medications   Medication Sig Start Date End Date Taking? Authorizing Provider  albuterol (PROVENTIL HFA;VENTOLIN HFA) 108 (90 BASE) MCG/ACT inhaler Inhale 1 puff into the lungs every 6 (six) hours as needed for wheezing or shortness of breath.   Yes [provider]  albuterol (PROVENTIL) (2.5 MG/3ML) 0.083% nebulizer solution Take 2.5 mg by nebulization every 6 (six) hours as needed for wheezing or shortness of breath.   Yes [provider]  ALPRAZolam Duanne Moron) 1 MG tablet Take 1 tablet (1 mg total) by mouth at bedtime. 09/15/16  Yes Thurnell Lose, MD  amLODipine (NORVASC) 5 MG tablet Take 5 mg by mouth daily.  04/06/13  Yes [provider]  busPIRone (BUSPAR) 5 MG tablet Take 5 mg by mouth 2 (two) times daily. 09/11/16  Yes [provider]  citalopram (CELEXA) 20 MG tablet Take 30 mg by mouth daily.    Yes  [provider]  Docusate Calcium (STOOL SOFTENER PO) Take 2 tablets by mouth daily.   Yes [provider]  esomeprazole (NEXIUM) 40 MG capsule Take 40 mg by mouth 2 (two) times daily before a meal.    Yes [provider]  gabapentin (NEURONTIN) 400 MG capsule TAKE 1 CAPSULE BY MOUTH FOUR TIMES A DAY 11/24/15  Yes Kirsteins, Luanna Salk, MD  IRON PO Take 1 tablet by mouth 2 (two) times a week.    Yes [provider]  lovastatin (MEVACOR) 10 MG tablet Take 10 mg by mouth daily. 09/22/14  Yes [provider]  meloxicam (MOBIC) 15 MG tablet Take 15 mg by mouth every evening. 05/23/16  Yes [provider]  methocarbamol (ROBAXIN) 500 MG tablet Take 250 mg by mouth 2 (two) times daily. 09/10/16  Yes [provider]  MOVANTIK 25 MG TABS tablet TAKE 1 TABLET BY MOUTH DAILY 10/20/16  Yes Meredith Staggers, MD  ondansetron (ZOFRAN) 4 MG tablet Take 4 mg by mouth every 8 (eight) hours as needed for nausea.  02/29/16  Yes [provider]  oxyCODONE-acetaminophen (PERCOCET) 5-325 MG tablet Take 1 tablet by mouth every 8 (eight) hours as needed for severe pain. 10/22/16  Yes Leandrew Koyanagi, MD  polyethylene glycol powder (GLYCOLAX/MIRALAX) powder Take 17 g by mouth daily. For constipation   Yes [provider]  senna (SENOKOT) 8.6 MG TABS tablet Take 2 tablets by mouth. constipation   Yes [provider]  traZODone (DESYREL) 50 MG tablet Take 0.5 tablets (25 mg total) by mouth at bedtime. 12/30/14  Yes Penumalli, Earlean Polka, MD  vitamin B-12 (CYANOCOBALAMIN) 1000 MCG tablet Take 2,000 mcg by mouth daily.    Yes [provider]  VOLTAREN 1 % GEL Apply 2 g topically 4 (four) times daily as needed (for pain).  03/29/16  Yes [provider]  acetaminophen (TYLENOL) 325 MG tablet Take 2 tablets (650 mg total) by mouth every 6 (six) hours as needed for mild pain (or Fever >/= 101). Patient not taking: Reported on 10/23/2016  05/24/15   Geradine Girt, DO  baclofen (LIORESAL) 10 MG tablet Take 1 tablet (10 mg total) by mouth 3 (three) times daily. Patient not taking: Reported on 10/23/2016 09/27/16   Sherrie George B, FNP  ciprofloxacin (CIPRO) 500 MG tablet Take 1 tablet (500 mg total) by mouth 2 (two) times daily. 10/23/16   Tyniesha Howald, PA-C  enoxaparin (LOVENOX) 40 MG/0.4ML injection Inject 0.4 mLs (40 mg total) into the skin daily. Patient not taking: Reported on 10/23/2016 09/13/16  Leandrew Koyanagi, MD  HYDROcodone-acetaminophen (NORCO) 7.5-325 MG tablet Take 1 tablet by mouth 3 (three) times daily. Patient not taking: Reported on 10/23/2016 09/15/16   Thurnell Lose, MD  Lidocaine 4 % PTCH Apply 1 patch topically daily. Patient not taking: Reported on 10/23/2016 09/27/16   Sherrie George B, FNP  methocarbamol (ROBAXIN) 500 MG tablet Take 1 tablet (500 mg total) by mouth every 6 (six) hours as needed for muscle spasms. Patient not taking: Reported on 10/23/2016 10/01/16   Leandrew Koyanagi, MD  methylPREDNISolone (MEDROL) 4 MG tablet Medrol dose pack. Take as instructed 10/24/16   Mcarthur Rossetti, MD  tamsulosin (FLOMAX) 0.4 MG CAPS capsule Take 1 capsule (0.4 mg total) by mouth daily. Patient not taking: Reported on 10/23/2016 09/16/16   Thurnell Lose, MD    Family History Family History  Problem Relation Age of Onset  . Heart failure Mother   . Heart attack Father     Social History Social History  Substance Use Topics  . Smoking status: Former Smoker    Packs/day: 1.50    Years: 30.00  . Smokeless tobacco: Never Used     Comment: quit 2013  . Alcohol use No     Allergies   Ampicillin-sulbactam sodium; Ampicillin; Toradol [ketorolac tromethamine]; Tramadol; Ambien [zolpidem tartrate]; Cephalexin; Ketorolac; Percocet [oxycodone-acetaminophen]; Sulfamethoxazole-trimethoprim; and Tape   Review of Systems Review of Systems  Ten systems reviewed and are negative for acute change, except as  noted in the HPI.   Physical Exam Updated Vital Signs BP (!) 158/91   Pulse 84   Temp (!) 101.1 F (38.4 C) (Oral)   Resp (!) 26   SpO2 95%   Physical Exam  Constitutional: She is oriented to person, place, and time. She appears well-developed.  Patient is writhing on the bed in pain. She intermittently stops speaking due to pain.  HENT:  Head: Normocephalic and atraumatic.  Eyes: Conjunctivae are normal. No scleral icterus.  Neck: Normal range of motion.  Cardiovascular: Normal rate, regular rhythm and normal heart sounds.  Exam reveals no gallop and no friction rub.   No murmur heard. Pulmonary/Chest: No respiratory distress.  Shallow breathing interrupted by pain.  Abdominal: Soft. Bowel sounds are normal. She exhibits no distension and no mass. There is no tenderness. There is no guarding.  Musculoskeletal:  No reproducible pain to palpation of the chest wall or upper back  Neurological: She is alert and oriented to person, place, and time.  Skin: Skin is warm and dry. She is not diaphoretic.  Psychiatric: Her behavior is normal.  Nursing note and vitals reviewed.    ED Treatments / Results  Labs (all labs ordered are listed, but only abnormal results are displayed) Labs Reviewed  URINE CULTURE - Abnormal; Notable for the following:       Result Value   Culture >=100,000 COLONIES/mL ESCHERICHIA COLI (*)    Organism ID, Bacteria ESCHERICHIA COLI (*)    All other components within normal limits  CBC WITH DIFFERENTIAL/PLATELET - Abnormal; Notable for the following:    Monocytes Absolute 1.1 (*)    All other components within normal limits  BASIC METABOLIC PANEL - Abnormal; Notable for the following:    Creatinine, Ser 1.22 (*)    GFR calc non Af Amer 42 (*)    GFR calc Af Amer 49 (*)    All other components within normal limits  URINALYSIS, ROUTINE W REFLEX MICROSCOPIC - Abnormal; Notable for the following:  APPearance CLOUDY (*)    Leukocytes, UA SMALL (*)     Bacteria, UA FEW (*)    Squamous Epithelial / LPF 0-5 (*)    All other components within normal limits  D-DIMER, QUANTITATIVE (NOT AT [facility]) - Abnormal; Notable for the following:    D-Dimer, Quant 1.71 (*)    All other components within normal limits  I-STAT TROPOININ, ED  I-STAT TROPOININ, ED    EKG  EKG Interpretation  Date/Time:  Wednesday Oct 23 2016 06:59:36 EDT Ventricular Rate:  80 PR Interval:    QRS Duration: 90 QT Interval:  357 QTC Calculation: 412 R Axis:   23 Text Interpretation:  Sinus rhythm Ventricular premature complex Borderline T abnormalities, anterior leads No significant change was found Confirmed by Ezequiel Essex [phone]) on 10/23/2016 7:04:07 AM Also confirmed by Ezequiel Essex [phone]), editor Drema Pry [phone])  on 10/23/2016 7:36:36 AM       Radiology No results found.  Procedures Procedures (including critical care time)  Medications Ordered in ED Medications  HYDROmorphone (DILAUDID) injection 1 mg (1 mg Intravenous Given 10/23/16 0748)  iopamidol (ISOVUE-370) 76 % injection (50 mLs  Contrast Given 10/23/16 0905)  HYDROmorphone (DILAUDID) injection 1 mg (1 mg Intravenous Given 10/23/16 0936)  sodium chloride 0.9 % bolus 1,000 mL (0 mLs Intravenous Stopped 10/23/16 1150)  fentaNYL (SUBLIMAZE) injection 50 mcg (50 mcg Intravenous Given 10/23/16 1036)  LORazepam (ATIVAN) injection 0.5 mg (0.5 mg Intravenous Given 10/23/16 1034)     Initial Impression / Assessment and Plan / ED Course  I have reviewed the triage vital signs and the nursing notes.  Pertinent labs & imaging results that were available during my care of the patient were reviewed by me and considered in my medical decision making (see chart for details).  Clinical Course as of Oct 26 1733  Wed Oct 23, 2016  0707 Patient with severe pain. Concern for dissection versus pulmonary embolus. I have a higher suspicion for PE given the nature of her pain as well as blood pressures  not being markedly elevated. The patient will have a CT angiogram of the chest. I have ordered pain medications.  [AH]  6599 D-Dimer, Quant: (!) 1.71 [AH]  3570 Patient with negative CT angiogram of the chest for pulmonary embolus. No noted dissection although this was not an optimal study.   [AH]  1158 Troponin i, poc: 0.00 [AH]    Clinical Course User Index [AH] Margarita Mail, PA-C    Patient January for discharge and noted to have developed a fever. She is Still complaining of diffuse myalgia, and although she did not have any complaints of urinary symptoms. She does complain of back pain. Also, her urine is a foul odor. I question if she has a developing urinary tract infection and patient will be treated as such. I have sent her urine for culture. I discussed return precautions with the patient and her daughter who understand and agree with the plan of care. There is no evidence for acute cardiac issues, pulmonary embolus or kidney stones. Patient does not have CVA tenderness.  Final Clinical Impressions(s) / ED Diagnoses   Final diagnoses:  Chest pain  Pain    New Prescriptions Discharge Medication List as of 10/23/2016  2:17 PM       Margarita Mail, PA-C 10/26/16 1736    Ezequiel Essex, MD 10/30/16 [phone]

## 2016-10-23 NOTE — ED Notes (Signed)
Placed pt on bedpan, tolerated well. 

## 2016-10-23 NOTE — ED Notes (Signed)
Pt family expressed multiple times concern about pt's pain level. This RN reviewed at length dc paper and follow up instructions.  This RN requested PA A. Harris to review again with pt and family before dc. EDP at bedside.

## 2016-10-23 NOTE — Discharge Instructions (Signed)
Your caregiver has diagnosed you as having chest pain that is not specific for one problem, but does not require admission.  You are at low risk for an acute heart condition or other serious illness. Chest pain comes from many different causes.  SEEK IMMEDIATE MEDICAL ATTENTION IF: You have severe chest pain, especially if the pain is crushing or pressure-like and spreads to the arms, back, neck, or jaw, or if you have sweating, nausea (feeling sick to your stomach), or shortness of breath. THIS IS AN EMERGENCY. Don't wait to see if the pain will go away. Get medical help at once. Call 911 or 0 (operator). DO NOT drive yourself to the hospital.  Your chest pain gets worse and does not go away with rest.  You have an attack of chest pain lasting longer than usual, despite rest and treatment with the medications your caregiver has prescribed.  You wake from sleep with chest pain or shortness of breath.  You feel dizzy or faint.  You have chest pain not typical of your usual pain for which you originally saw your caregiver.  SEEK IMMEDIATE MEDICAL ATTENTION IF: New numbness, tingling, weakness, or problem with the use of your arms or legs.  Severe back pain not relieved with medications.  Change in bowel or bladder control.  Increasing pain in any areas of the body (such as chest or abdominal pain).  Shortness of breath, dizziness or fainting.  Nausea (feeling sick to your stomach), vomiting, fever, or sweats.

## 2016-10-23 NOTE — ED Triage Notes (Signed)
Pt presents via [facility] for lower back, L flank, and L posterior rib pain; pt denies any known injury; denies urinary symptoms; began last night at 2300

## 2016-10-24 ENCOUNTER — Ambulatory Visit (INDEPENDENT_AMBULATORY_CARE_PROVIDER_SITE_OTHER): Payer: Medicare Other | Admitting: Orthopaedic Surgery

## 2016-10-24 ENCOUNTER — Telehealth (INDEPENDENT_AMBULATORY_CARE_PROVIDER_SITE_OTHER): Payer: Self-pay | Admitting: Radiology

## 2016-10-24 DIAGNOSIS — G8929 Other chronic pain: Secondary | ICD-10-CM | POA: Diagnosis not present

## 2016-10-24 DIAGNOSIS — M25561 Pain in right knee: Secondary | ICD-10-CM

## 2016-10-24 MED ORDER — METHYLPREDNISOLONE 4 MG PO TABS: ORAL_TABLET | ORAL | 0 refills | Status: DC

## 2016-10-24 NOTE — Progress Notes (Signed)
Office Visit Note   Patient: [name]           Date of Birth: 12-Oct-1940           MRN: [id number] Visit Date: 10/24/2016              Requested by: Wenda Low, MD [address], [place] [address] PCP: Wenda Low, MD   Assessment & Plan: Visit Diagnoses:  1. Chronic pain of right knee     Plan: She is someone that would definitely benefit from continued physical therapy for just working on balance and gait training coordination and quad strengthening. Release try hinged knee brace on her knee to help with stability purposes but we have encouraged her to try to get through therapy is much is possible to get stronger again. She may need to just only ambulate with assistive device such as a walker. She has regular follow-up coming up with Dr. Erlinda Hong for her hip fracture. At that visit he does on x-rays of her right hip to assess fracture healing.  Follow-Up Instructions: Return in about 4 weeks (around 11/21/2016).   Orders:  No orders of the defined types were placed in this encounter.  Meds ordered this encounter  Medications  . methylPREDNISolone (MEDROL) 4 MG tablet    Sig: Medrol dose pack. Take as instructed    Dispense:  21 tablet    Refill:  0      Procedures: No procedures performed   Clinical Data: No additional findings.   Subjective: No chief complaint on file. The patient is a 75 year old female who is actually in postop follow-up with my partner Dr. Erlinda Hong who 6 weeks ago performed open reduction internal fixation on a right intertrochanteric hip fracture. She has a history of chronic right knee pain and she will be seen for her knee today. Apparently she's had multiple surgeries over the years by Dr. Lorre Nick here in town with a remote history of a revision knee arthroplasty. She says that knee gives out on her and gives her a lot of pain and problems. She's had a lot of recent falls. She is here with her family today and had x-rays  recently of that knee that were performed in the emergency department. She says the knee swells on her as well and just feels unstable and that's lead to her falls as well as her hip fracture.  HPI  Review of Systems She currently denies any headache or syncopal episodes. She denies any chest pain or shortness of breath, she denies any fever chills nausea or vomiting  Objective: Vital Signs: There were no vitals taken for this visit.  Physical Exam She is alert and oriented 3 and in no acute distress Ortho Exam Examination of her right knee shows actually good range of motion the knee. Feels ligaments stable. There is a well-healed surgical incision anteriorly and no effusion and no redness to the knee either. Specialty Comments:  No specialty comments available.  Imaging: No results found. X-rays of her knee for the emergency room department that are independently reviewed by me show a revision arthroplasty of the right knee with intact femoral and tibial components. There is no acute findings. There is no effusion or fracture. There is no evidence of ostial lysis or loosening on these films.  PMFS History: Patient Active Problem List   Diagnosis Date Noted  . Hip fracture ([place]) 09/13/2016  . Displaced intertrochanteric fracture of right femur, initial encounter  for closed fracture ([place]) 09/13/2016  . Fall   . Chronic pain of both shoulders 08/12/2016  . Spinal stenosis in cervical region 06/27/2015  . Syncope 05/22/2015  . Diffuse myofascial pain syndrome 03/24/2015  . Sepsis ([place]) 10/08/2014  . Bacteremia 10/07/2014  . Leukopenia 10/07/2014  . Thrombocytopenia ([place]) 10/07/2014  . Fever 10/05/2014  . Hypoxia 10/05/2014  . HLD (hyperlipidemia) 07/30/2013  . HTN (hypertension) 07/30/2013  . GERD (gastroesophageal reflux disease) 07/30/2013  . Chest pain 07/29/2013  . Dizziness and giddiness 06/18/2013  . Gait difficulty 05/03/2013  . Memory loss 05/03/2013   Past Medical  History:  Diagnosis Date  . Anemia   . B12 deficiency   . Bladder incontinence   . Blind left eye   . Cataract   . Cervical spine fracture ([place])   . Compression fracture    C7,  upper T spine -compression fx  . COPD (chronic obstructive pulmonary disease) ([place])   . DDD (degenerative disc disease), lumbar   . Depression    major  . GERD (gastroesophageal reflux disease)   . Hiatal hernia   . Hyperlipidemia   . Hypertension   . Macular degeneration   . Osteoarthritis   . Osteoporosis   . Reactive airway disease   . Rupture of bowel ([place])   . Stroke [facility])    "mini stroke"  . Stroke [facility])     Family History  Problem Relation Age of Onset  . Heart failure Mother   . Heart attack Father     Past Surgical History:  Procedure Laterality Date  . ABDOMINAL HYSTERECTOMY    . ABDOMINAL SURGERY    . APPENDECTOMY    . BLADDER SURGERY    . CHOLECYSTECTOMY    . COLON SURGERY    . EYE SURGERY     cataracts  . INTRAMEDULLARY (IM) NAIL INTERTROCHANTERIC Right 09/13/2016   Procedure: INTRAMEDULLARY (IM) NAIL INTERTROCHANTRIC RIGHT;  Surgeon: Leandrew Koyanagi, MD;  Location: [facility];  Service: Orthopedics;  Laterality: Right;  . JOINT REPLACEMENT    . KNEE SURGERY    . OSTOMY    . RECTOCELE REPAIR     Social History   Occupational History  . retired     n/a   Social History Main Topics  . Smoking status: Former Smoker    Packs/day: 1.50    Years: 30.00  . Smokeless tobacco: Never Used     Comment: quit 2013  . Alcohol use No  . Drug use: No  . Sexual activity: No

## 2016-10-24 NOTE — Telephone Encounter (Signed)
Physical therapist needs more physical therpy orders from Dr. Erlinda Hong.    Martin Majestic to ER wed 10-23-16 from pain did labs, imaging of chest. Physical therapist called to inform us pt fell this am and needs to be seen by dr. set app. for today at 4pm w Dr. Ninfa Linden

## 2016-10-25 LAB — URINE CULTURE: Culture: >=100000 — AB

## 2016-10-26 ENCOUNTER — Telehealth: Payer: Self-pay

## 2016-10-26 NOTE — Telephone Encounter (Signed)
Post ED Visit - Positive Culture Follow-up  Culture report reviewed by antimicrobial stewardship pharmacist:  []  Elenor Quinones, Pharm.D. []  Heide Guile, Pharm.D., BCPS AQ-ID []  [name] Neptune, Pharm.D., BCPS []  Alycia Rossetti, Pharm.D., BCPS []  [place], Pharm.D., BCPS, AAHIVP []  Legrand [place]o, Pharm.D., BCPS, AAHIVP []  Salome Arnt, PharmD, BCPS [x]  Dimitri Ped, PharmD, BCPS []  Vincenza Hews, PharmD, BCPS  Positive urine culture Treated with Ciprofloxzcin, organism sensitive to the same and no further patient follow-up is required at this time.  Genia Del 10/26/2016, 9:10 AM

## 2016-10-28 ENCOUNTER — Telehealth (INDEPENDENT_AMBULATORY_CARE_PROVIDER_SITE_OTHER): Payer: Self-pay | Admitting: Orthopaedic Surgery

## 2016-10-28 NOTE — Telephone Encounter (Signed)
yes

## 2016-10-28 NOTE — Telephone Encounter (Signed)
Verbal orders needed to continue in the home.

## 2016-10-29 NOTE — Telephone Encounter (Signed)
Called to advise no answer LMOM, approved orders by Dr Erlinda Hong

## 2016-10-30 IMAGING — CT CT T SPINE W/O CM
3 series · 11 of 33 positions shown, 13 images · non-contrast
Comparison: 10/05/2014

CLINICAL DATA: Trauma with neck pain since yesterday. Recent C7
fracture.

EXAM:
CT THORACIC SPINE WITHOUT CONTRAST
TECHNIQUE: Multidetector CT imaging of the thoracic spine was performed without
intravenous contrast administration. Multiplanar CT image
reconstructions were also generated.

[Series 4: t-spine 2.0 i30s 3 · axial · 0.37mm/px · z∈[+1058,+1248]mm · 3 of 155 slices shown, 4 images]
[im 36/155  soft-tissue]
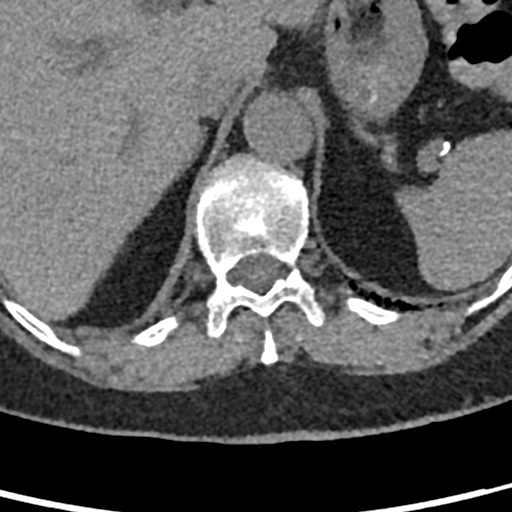
[im 36/155  bone]
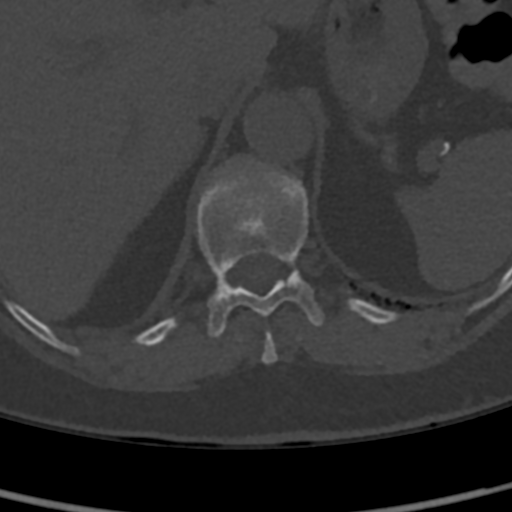
[im 83/155  bone]
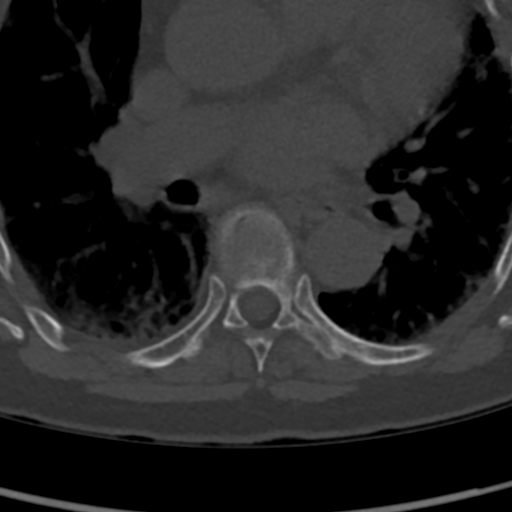
[im 131/155  bone]
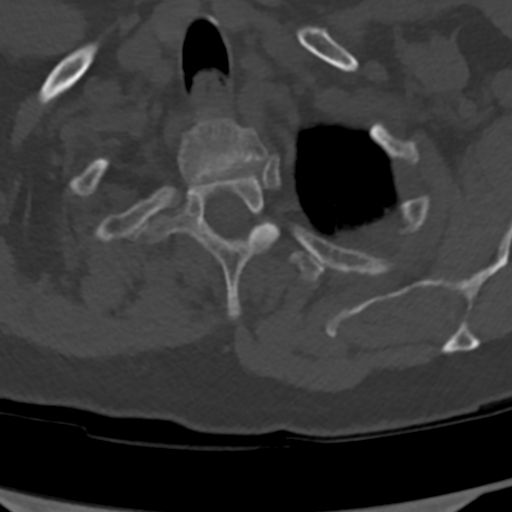

[Series 5: coronal bone · coronal · 0.38mm/px · 3 of 69 slices shown]
[im 14/69  bone]
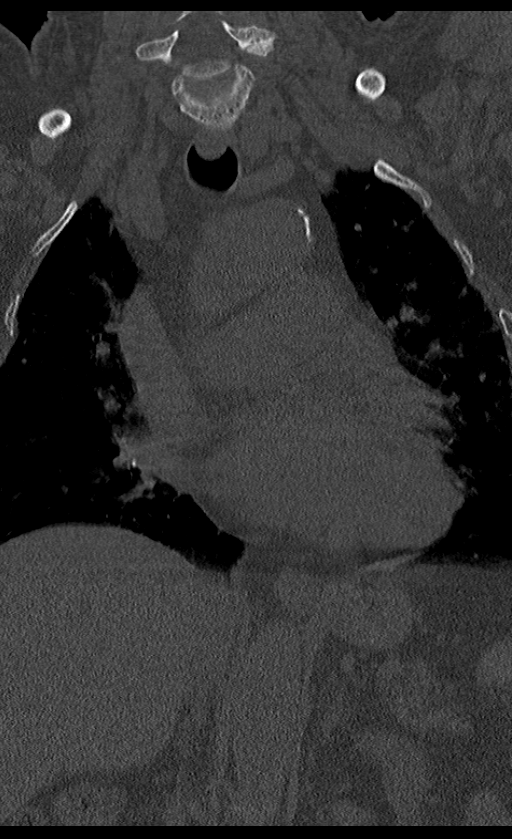
[im 28/69  bone]
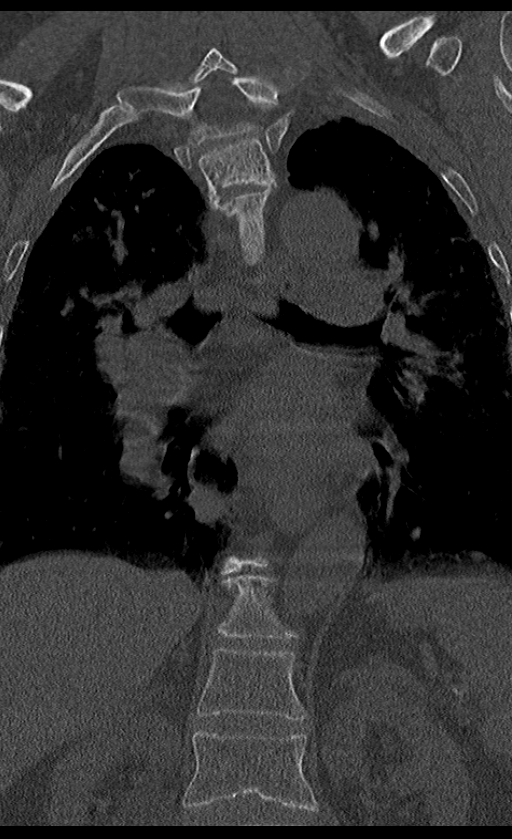
[im 41/69  bone]
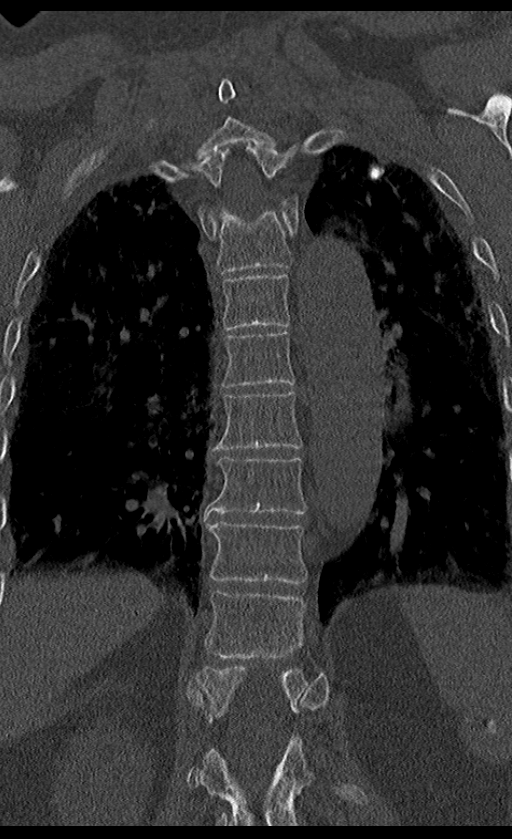

[Series 6: sagittal bone · sagittal · 0.36mm/px · 5 of 41 slices shown, 6 images]
[im 14/41  bone]
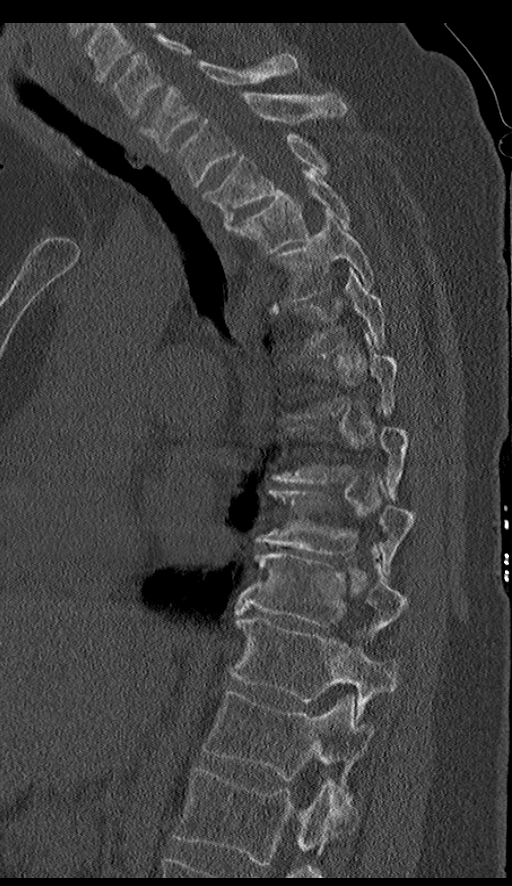
[im 17/41  bone]
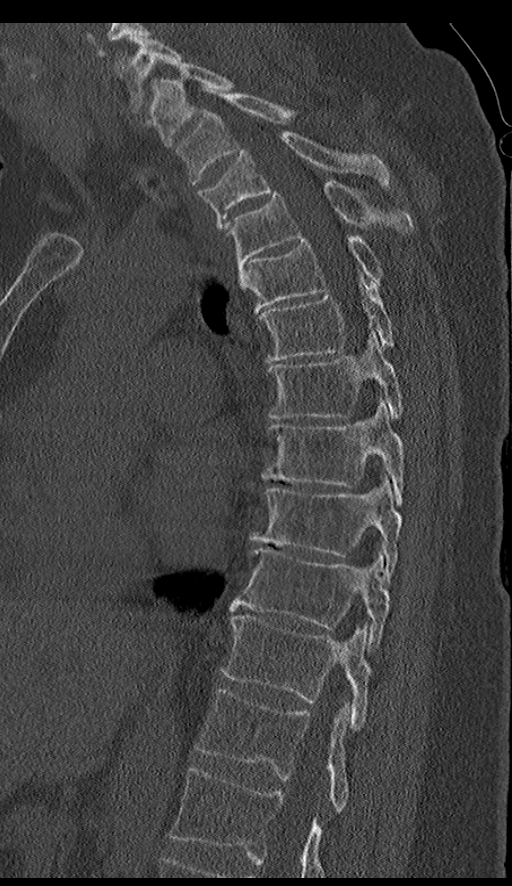
[im 21/41  soft-tissue]
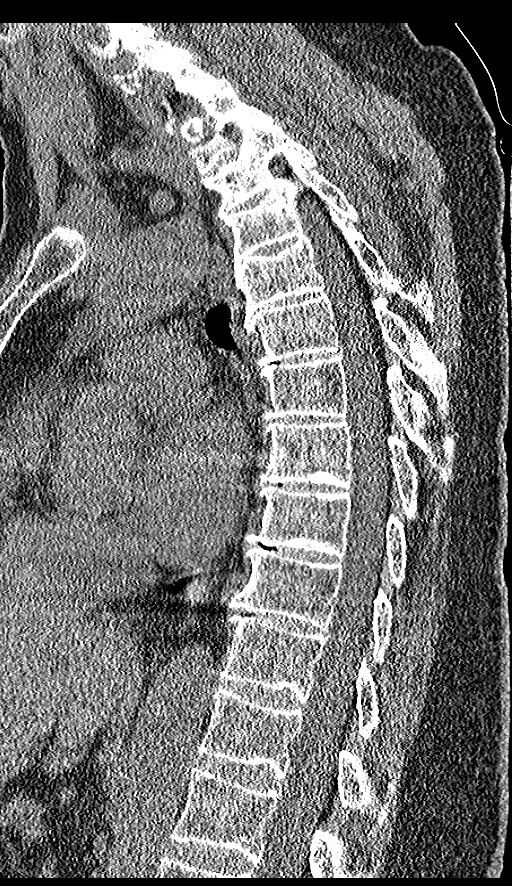
[im 21/41  bone]
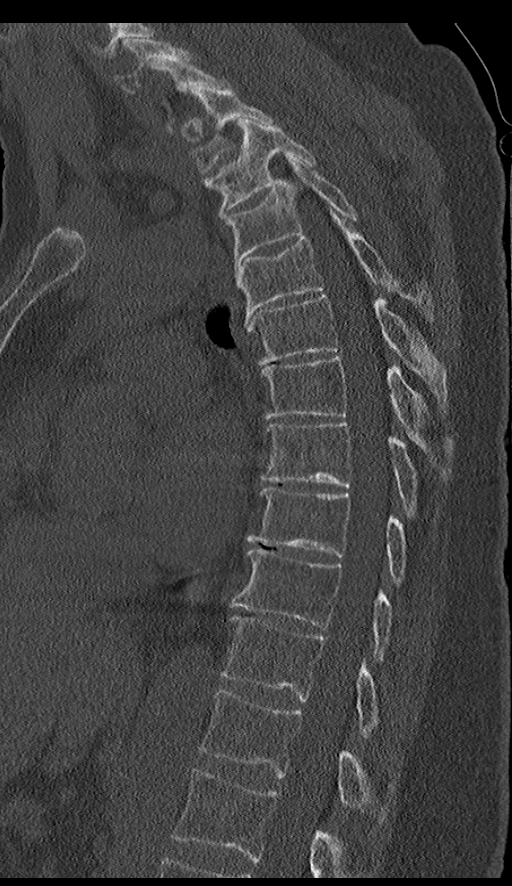
[im 24/41  bone]
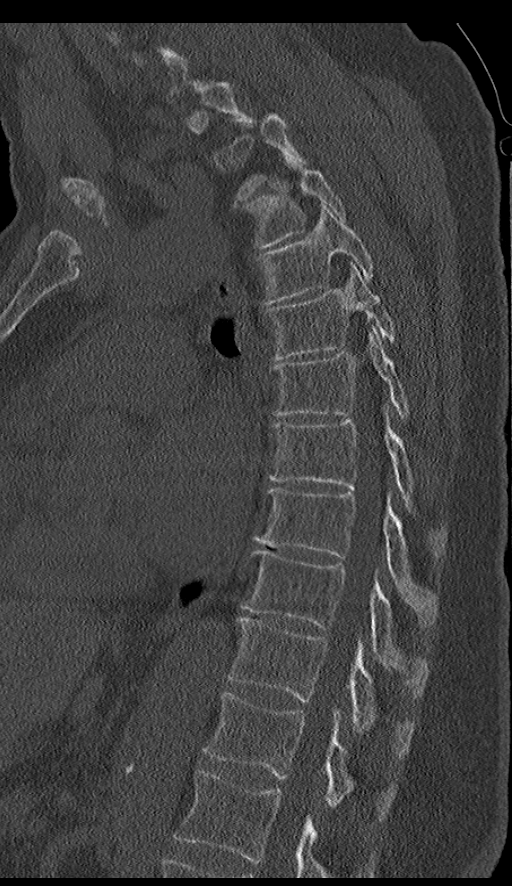
[im 27/41  bone]
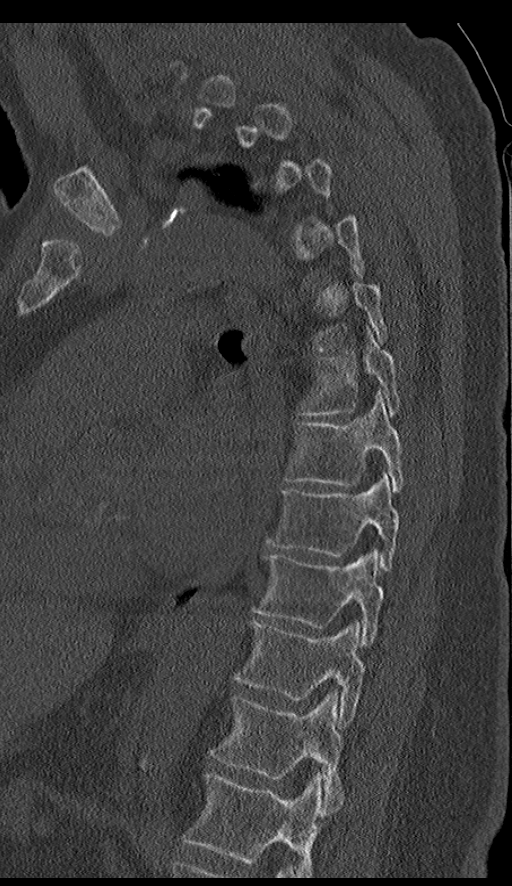

[11 of 33 positions shown; findings below may reference images not displayed]

FINDINGS: C7 fracture as described on dedicated cervical spine CT. Chronic T2
and T3 superior endplate deformities but new T2 inferior endplate
deformity with visible fracture line. There is also an anterior
superior corner fracture of T3 which is newly seen and presumably
recent. Height loss is mild, less than 20% at these new fractures.

T4 superior endplate concavity which is chronic. No subluxation or
posterior element fracture. No evidence of canal hematoma.

Small peripherally calcified structures in the bilateral renal
hilum, up to 9 mm on the right and 7 mm on the left. These are
consistent with renal artery aneurysms and show no definitive growth
since 8118 CT when the larger right-sided aneurysm appeared
thrombosed. Granulomatous changes in the left lung and spleen.
IMPRESSION: 1. Acute appearing T2 inferior endplate and T3 superior endplate
fractures with mild height loss.
2. Healing C7 body fracture.
3. Bilateral renal artery aneurysms, up to 9 mm on the right. No
suspected growth since comparison in 8118.

## 2016-11-06 ENCOUNTER — Telehealth (INDEPENDENT_AMBULATORY_CARE_PROVIDER_SITE_OTHER): Payer: Self-pay | Admitting: Orthopaedic Surgery

## 2016-11-06 NOTE — Telephone Encounter (Signed)
LMOM for [name] of the below message

## 2016-11-06 NOTE — Telephone Encounter (Signed)
[name] called saying that the patients right leg is really hurting, she's suppose to be discharged this week but her right knee is hindering her exercises. CB # [phone]

## 2016-11-06 NOTE — Telephone Encounter (Signed)
The doctor at her facility needs to evaluate it

## 2016-11-06 NOTE — Telephone Encounter (Signed)
Please advise 

## 2016-11-07 ENCOUNTER — Ambulatory Visit: Payer: Medicare Other | Attending: Nurse Practitioner | Admitting: Nurse Practitioner

## 2016-11-07 ENCOUNTER — Encounter: Payer: Self-pay | Admitting: Nurse Practitioner

## 2016-11-07 VITALS — BP 128/59 | HR 83 | Temp 98.3°F | Resp 18 | Ht 65.0 in | Wt 185.0 lb

## 2016-11-07 DIAGNOSIS — M810 Age-related osteoporosis without current pathological fracture: Secondary | ICD-10-CM | POA: Insufficient documentation

## 2016-11-07 DIAGNOSIS — F324 Major depressive disorder, single episode, in partial remission: Secondary | ICD-10-CM | POA: Insufficient documentation

## 2016-11-07 DIAGNOSIS — R42 Dizziness and giddiness: Secondary | ICD-10-CM | POA: Insufficient documentation

## 2016-11-07 DIAGNOSIS — K219 Gastro-esophageal reflux disease without esophagitis: Secondary | ICD-10-CM | POA: Diagnosis not present

## 2016-11-07 DIAGNOSIS — M5030 Other cervical disc degeneration, unspecified cervical region: Secondary | ICD-10-CM | POA: Diagnosis not present

## 2016-11-07 DIAGNOSIS — M4696 Unspecified inflammatory spondylopathy, lumbar region: Secondary | ICD-10-CM

## 2016-11-07 DIAGNOSIS — M25561 Pain in right knee: Secondary | ICD-10-CM | POA: Diagnosis not present

## 2016-11-07 DIAGNOSIS — E7800 Pure hypercholesterolemia, unspecified: Secondary | ICD-10-CM | POA: Insufficient documentation

## 2016-11-07 DIAGNOSIS — G63 Polyneuropathy in diseases classified elsewhere: Secondary | ICD-10-CM | POA: Insufficient documentation

## 2016-11-07 DIAGNOSIS — N189 Chronic kidney disease, unspecified: Secondary | ICD-10-CM | POA: Insufficient documentation

## 2016-11-07 DIAGNOSIS — G894 Chronic pain syndrome: Secondary | ICD-10-CM

## 2016-11-07 DIAGNOSIS — R51 Headache: Secondary | ICD-10-CM | POA: Diagnosis not present

## 2016-11-07 DIAGNOSIS — E538 Deficiency of other specified B group vitamins: Secondary | ICD-10-CM | POA: Insufficient documentation

## 2016-11-07 DIAGNOSIS — N3944 Nocturnal enuresis: Secondary | ICD-10-CM | POA: Insufficient documentation

## 2016-11-07 DIAGNOSIS — J449 Chronic obstructive pulmonary disease, unspecified: Secondary | ICD-10-CM | POA: Insufficient documentation

## 2016-11-07 DIAGNOSIS — F419 Anxiety disorder, unspecified: Secondary | ICD-10-CM | POA: Insufficient documentation

## 2016-11-07 DIAGNOSIS — M542 Cervicalgia: Secondary | ICD-10-CM | POA: Diagnosis not present

## 2016-11-07 DIAGNOSIS — M79604 Pain in right leg: Secondary | ICD-10-CM | POA: Insufficient documentation

## 2016-11-07 DIAGNOSIS — R0781 Pleurodynia: Secondary | ICD-10-CM | POA: Diagnosis not present

## 2016-11-07 DIAGNOSIS — R55 Syncope and collapse: Secondary | ICD-10-CM | POA: Insufficient documentation

## 2016-11-07 DIAGNOSIS — Z79891 Long term (current) use of opiate analgesic: Secondary | ICD-10-CM | POA: Diagnosis not present

## 2016-11-07 DIAGNOSIS — M545 Low back pain: Secondary | ICD-10-CM | POA: Diagnosis not present

## 2016-11-07 DIAGNOSIS — I129 Hypertensive chronic kidney disease with stage 1 through stage 4 chronic kidney disease, or unspecified chronic kidney disease: Secondary | ICD-10-CM | POA: Diagnosis not present

## 2016-11-07 DIAGNOSIS — M25551 Pain in right hip: Secondary | ICD-10-CM | POA: Diagnosis not present

## 2016-11-07 DIAGNOSIS — M47816 Spondylosis without myelopathy or radiculopathy, lumbar region: Secondary | ICD-10-CM

## 2016-11-07 DIAGNOSIS — G8929 Other chronic pain: Secondary | ICD-10-CM

## 2016-11-07 HISTORY — DX: Hypertensive chronic kidney disease with stage 1 through stage 4 chronic kidney disease, or unspecified chronic kidney disease: I12.9

## 2016-11-07 NOTE — Progress Notes (Addendum)
Patient's Name: [name]  MRN: [id number]  Referring Provider: Georgann Housekeeper, MD  DOB: 06/21/1940  PCP: Georgann Housekeeper, MD  DOS: 11/07/2016  Note by: Thad Ranger NP  Service setting: Ambulatory outpatient  Specialty: Interventional Pain Management  Location: [facility] (AMB) Pain Management Facility    Patient type: New Patient    Primary Reason(s) for Visit: Initial Patient Evaluation CC: Leg Pain (right ----post hip fracture)  HPI  [name] is a 76 y.o. year old, female patient, who comes today for an initial evaluation. She has Gait difficulty; Memory loss; Dizziness and giddiness; Chest pain; Hypercholesteremia; HTN (hypertension); GERD (gastroesophageal reflux disease); Fever; Hypoxia; Bacteremia; Leukopenia; Thrombocytopenia (HCC); Sepsis (HCC); Diffuse myofascial pain syndrome; Syncope, non cardiac; Spinal stenosis in cervical region; Chronic pain of both shoulders; Hip fracture (HCC); Fall; Displaced intertrochanteric fracture of right femur, initial encounter for closed fracture (HCC); Age-related osteoporosis with current pathological fracture with routine healing; Anxiety; B12 neuropathy (HCC); Hypertensive kidney disease, malignant; Chronic left shoulder pain; Low back pain (teritary) (Right > Left); CAFL (chronic airflow limitation) (HCC); Apoplectic vertigo; Duodenogastric reflux; History of stroke; Hoarseness; Benign essential HTN; Knee pain (primary) (right); Disc disorder; Depression, major, in partial remission (HCC); Major depression in remission (HCC); Involutional osteoporosis; Pharyngoesophageal dysphagia; Status post revision of total replacement of right knee; Status post total left knee replacement; Infection of the upper respiratory tract; Bed wetting; UTI (urinary tract infection); Wrist pain, acute; Long term current use of opiate analgesic; Long term prescription opiate use; Chronic pain syndrome; Neck pain (secondary) (left > right); Hip pain; and Rib pain on her problem  list.. Her primarily concern today is the Leg Pain (right ----post hip fracture)  Pain Assessment: Self-Reported Pain Score: 9 /10 Clinically the patient looks like a 5/10 Reported level is inconsistent with clinical observations. Information on the proper use of the pain scale provided to the patient today Pain Descriptors / Indicators: Stabbing, Aching, Constant Pain Frequency: Constant  Onset and Duration: Sudden Cause of pain: Unknown Severity: NAS-11 at its worse: 10/10, NAS-11 at its best: 2/10, NAS-11 now: 10/10 and NAS-11 on the average: 8/10 Timing: Not influenced by the time of the day Aggravating Factors: Climbing, Motion, Prolonged standing, Squatting, Stooping , Twisting, Walking, Walking uphill and Walking downhill Alleviating Factors: no comment Associated Problems: Day-time cramps, Night-time cramps, Depression, Fatigue, Inability to concentrate, Inability to control bladder (urine), Nausea, Sadness, Spasms, Swelling, Weakness, Pain that wakes patient up and Pain that does not allow patient to sleep Quality of Pain: Aching, Agonizing, Constant, Cramping, Deep, Disabling, Tender, Throbbing, Tiring and Uncomfortable Previous Examinations or Tests: CT scan, X-rays, Neurological evaluation and Orthoperdic evaluation Previous Treatments: Epidural steroid injections and Trigger point injections  The patient comes into the clinics today for the first time for a chronic pain management evaluation. According to the patient pain is worse in her right knee. She is status post surgery 10 years ago. She has numbness and weakness. She has had steroid injections which were effective. She amits that she needs a replacement however declines at this time. She has completed physical therapy for approximately 4 weeks: Twice weekly in home. She has not had any recent images but is scheduled to have images completed on tomorrow.   Her second worst area of pain is in her neck. She admits the left is  greater than the right. She does have symptoms that radiate into her upper arms. She has some weakness. He denies any previous surgeries. She had had injections in  the past at [facility]. She has also had physical therapy but does not feel it was effective. She admits that she was dismissed from this practice secondary to missing too many appointments.  Her third area of pain is in her lower back. The right is greater than the left. She denies any radicular symptoms. She denies any previous surgery, interventions or physical therapy.  She admits that she suffered a fall and is status post hip and femur repair. She continues to have pain in this area. She denies any interventional therapy has had physical therapy after surgery.  Today I took the time to provide the patient with information regarding this pain practice. The patient was informed that the practice is divided into two sections: an interventional pain management section, as well as a completely separate and distinct medication management section. I explained that there are procedure days for interventional therapies, and evaluation days for follow-ups and medication management. Because of the amount of documentation required during both, they are kept separated. This means that there is the possibility that she may be scheduled for a procedure on one day, and medication management the next. I have also informed her that because of staffing and facility limitations, this practice will no longer take patients for medication management only. To illustrate the reasons for this, I gave the patient the example of surgeons, and how inappropriate it would be to refer a patient to his/her care, just to write for the post-surgical antibiotics on a surgery done by a different surgeon.   Because interventional pain management is part of the board-certified specialty for the doctors, the patient was informed that joining this practice  means that they are open to any and all interventional therapies. I made it clear that this does not mean that they will be forced to have any procedures done. What this means is that I believe interventional therapies to be essential part of the diagnosis and proper management of chronic pain conditions. Therefore, patients not interested in these interventional alternatives will be better served under the care of a different practitioner.  The patient was also made aware of my Comprehensive Pain Management Safety Guidelines where by joining this practice, they limit all of their nerve blocks and joint injections to those done by our practice, for as long as we are retained to manage their care. Historic Controlled Substance Pharmacotherapy Review  PMP and historical list of controlled substances: Hydrocodone/acetaminophen 7.5/325 mg 3 times daily oxycodone/acetaminophen 5/325 mg 3 times daily, alprazolam 1 mg 1-2 times daily, morphine sulfate IR 15 mg tablet twice daily, hydrocodone acetaminophen 5/325 mg twice daily, hydrocodone/APAP 10 mg/325 twice daily, Butrans10 g patch weekly, diazepam 2 mg tablet 3 times daily, acetaminophen/codeine No. 4 6 times daily, Cheratussin syrup 210 mls, oxycodone 5 mg twice daily, oxycodone 5 mg twice daily Highest opioid analgesic regimen found: Acetaminophen with codeine No. 4 one tablet 6 times daily(fill date 04/05/2015) Most recent opioid analgesic: Hydrocodone/acetaminophen 7.5/325 milligrams tablet 3 times daily (fill date 10/31/2016)(hydrocodone 22.5mg   per day) Current opioid analgesics: Hydrocodone/acetaminophen 7.5/325 milligrams 3 times daily (fill date 10/31/2016)(hydrocodone 22.5 mg  per day) Highest recorded MME/day: 54 mg/day MME/day:22.5 mg/day Medications: The patient did not bring the medication(s) to the appointment, as requested in our "New Patient Package" Pharmacodynamics: Desired effects: Analgesia: The patient reports >50% benefit. Reported  improvement in function: The patient reports medication allows her to accomplish basic ADLs. Clinically meaningful improvement in function (CMIF): Sustained CMIF goals met Perceived  effectiveness: Described as relatively effective, allowing for increase in activities of daily living (ADL) Undesirable effects: Side-effects or Adverse reactions: None reported Historical Monitoring: The patient  reports that she does not use drugs. List of all UDS Test(s): Lab Results  Component Value Date   MDMA NEG 04/25/2015   COCAINSCRNUR NONE DETECTED 05/22/2015   COCAINSCRNUR NEG 04/25/2015   THCU NONE DETECTED 05/22/2015   THCU NEG 04/25/2015   ETH <5 05/22/2015   List of all Serum Drug Screening Test(s):  No results found for: AMPHSCRSER, BARBSCRSER, BENZOSCRSER, COCAINSCRSER, PCPSCRSER, PCPQUANT, THCSCRSER, CANNABQUANT, OPIATESCRSER, OXYSCRSER, PROPOXSCRSER Historical Background Evaluation: [place] PDMP: Six (6) year initial data search conducted.             [place] Department of public safety, offender search: Engineer, mining Information) Non-contributory Risk Assessment Profile: Aberrant behavior: None observed or detected today Risk factors for fatal opioid overdose: None identified today Fatal overdose hazard ratio (HR): Calculation deferred Non-fatal overdose hazard ratio (HR): Calculation deferred Risk of opioid abuse or dependence: 0.7-3.0% with doses ? 36 MME/day and 6.1-26% with doses ? 120 MME/day. Substance use disorder (SUD) risk level: Pending results of Medical Psychology Evaluation for SUD Opioid risk tool (ORT) (Total Score): 0  ORT Scoring interpretation table:  Score <3 = Low Risk for SUD  Score between 4-7 = Moderate Risk for SUD  Score >8 = High Risk for Opioid Abuse   PHQ-2 Depression Scale:  Total score: 0  PHQ-2 Scoring interpretation table: (Score and probability of major depressive disorder)  Score 0 = No depression  Score 1 = 15.4% Probability  Score 2 = 21.1% Probability   Score 3 = 38.4% Probability  Score 4 = 45.5% Probability  Score 5 = 56.4% Probability  Score 6 = 78.6% Probability   PHQ-9 Depression Scale:  Total score: 0  PHQ-9 Scoring interpretation table:  Score 0-4 = No depression  Score 5-9 = Mild depression  Score 10-14 = Moderate depression  Score 15-19 = Moderately severe depression  Score 20-27 = Severe depression (2.4 times higher risk of SUD and 2.89 times higher risk of overuse)   Pharmacologic Plan: Pending ordered tests and/or consults  Meds  The patient has a current medication list which includes the following prescription(s): acetaminophen, albuterol, albuterol, alprazolam, amlodipine, buspirone, citalopram, esomeprazole, gabapentin, lidocaine, lovastatin, meloxicam, methocarbamol, ondansetron, senna, trazodone, vitamin b-12, voltaren, baclofen, ciprofloxacin, docusate calcium, enoxaparin, hydrocodone-acetaminophen, iron, methocarbamol, methylprednisolone, movantik, oxycodone-acetaminophen, polyethylene glycol powder, and tamsulosin.  Current Outpatient Prescriptions on File Prior to Visit  Medication Sig  . acetaminophen (TYLENOL) 325 MG tablet Take 2 tablets (650 mg total) by mouth every 6 (six) hours as needed for mild pain (or Fever >/= 101).  Marland Kitchen albuterol (PROVENTIL HFA;VENTOLIN HFA) 108 (90 BASE) MCG/ACT inhaler Inhale 1 puff into the lungs every 6 (six) hours as needed for wheezing or shortness of breath.  Marland Kitchen albuterol (PROVENTIL) (2.5 MG/3ML) 0.083% nebulizer solution Take 2.5 mg by nebulization every 6 (six) hours as needed for wheezing or shortness of breath.  Marland Kitchen amLODipine (NORVASC) 5 MG tablet Take 5 mg by mouth daily.   . citalopram (CELEXA) 20 MG tablet Take 30 mg by mouth daily.   Marland Kitchen esomeprazole (NEXIUM) 40 MG capsule Take 40 mg by mouth 2 (two) times daily before a meal.   . gabapentin (NEURONTIN) 400 MG capsule TAKE 1 CAPSULE BY MOUTH FOUR TIMES A DAY  . Lidocaine 4 % PTCH Apply 1 patch topically daily.  Marland Kitchen lovastatin  (MEVACOR) 10 MG tablet Take  10 mg by mouth daily.  . meloxicam (MOBIC) 15 MG tablet Take 15 mg by mouth every evening.  . methocarbamol (ROBAXIN) 500 MG tablet Take 1 tablet (500 mg total) by mouth every 6 (six) hours as needed for muscle spasms.  . ondansetron (ZOFRAN) 4 MG tablet Take 4 mg by mouth every 8 (eight) hours as needed for nausea.   Marland Kitchen senna (SENOKOT) 8.6 MG TABS tablet Take 2 tablets by mouth. constipation  . vitamin B-12 (CYANOCOBALAMIN) 1000 MCG tablet Take 2,000 mcg by mouth daily.   . VOLTAREN 1 % GEL Apply 2 g topically 4 (four) times daily as needed (for pain).   . baclofen (LIORESAL) 10 MG tablet Take 1 tablet (10 mg total) by mouth 3 (three) times daily. (Patient not taking: Reported on 10/23/2016)  . ciprofloxacin (CIPRO) 500 MG tablet Take 1 tablet (500 mg total) by mouth 2 (two) times daily. (Patient not taking: Reported on 11/07/2016)  . Docusate Calcium (STOOL SOFTENER PO) Take 2 tablets by mouth daily.  Marland Kitchen enoxaparin (LOVENOX) 40 MG/0.4ML injection Inject 0.4 mLs (40 mg total) into the skin daily. (Patient not taking: Reported on 10/23/2016)  . HYDROcodone-acetaminophen (NORCO) 7.5-325 MG tablet Take 1 tablet by mouth 3 (three) times daily. (Patient not taking: Reported on 11/07/2016)  . IRON PO Take 1 tablet by mouth 2 (two) times a week.   . methocarbamol (ROBAXIN) 500 MG tablet Take 250 mg by mouth 2 (two) times daily.  . methylPREDNISolone (MEDROL) 4 MG tablet Medrol dose pack. Take as instructed (Patient not taking: Reported on 11/07/2016)  . MOVANTIK 25 MG TABS tablet TAKE 1 TABLET BY MOUTH DAILY (Patient not taking: Reported on 11/07/2016)  . oxyCODONE-acetaminophen (PERCOCET) 5-325 MG tablet Take 1 tablet by mouth every 8 (eight) hours as needed for severe pain. (Patient not taking: Reported on 11/07/2016)  . polyethylene glycol powder (GLYCOLAX/MIRALAX) powder Take 17 g by mouth daily. For constipation  . tamsulosin (FLOMAX) 0.4 MG CAPS capsule Take 1 capsule (0.4 mg  total) by mouth daily. (Patient not taking: Reported on 10/23/2016)   No current facility-administered medications on file prior to visit.    Imaging Review  Cervical Imaging: Cervical MR wo contrast:  Results for orders placed during the hospital encounter of 10/07/14  MR Cervical Spine Wo Contrast   Narrative CLINICAL DATA:  Known cervical spine fracture, new onset fever. Here for assessment of sepsis.  EXAM: MRI CERVICAL AND LUMBAR SPINE WITHOUT CONTRAST  TECHNIQUE: Multiplanar and multiecho pulse sequences of the cervical spine, to include the craniocervical junction and cervicothoracic junction, and thoracic spine, were obtained without intravenous contrast. Please note, motion degraded examination. Per technologist note, patient did not wish to receive intravenous contrast due to pain, examination is noncontrast.  COMPARISON:  CT of the cervical spine Oct 05, 2014  FINDINGS: MRI CERVICAL SPINE FINDINGS  Motion degraded examination.  Moderate C7 compression fracture, low T1 and bright T2 signal with 30-50% height loss. No retropulsed bony fragments. Remaining cervical vertebral bodies appear intact. Maintenance of cervical lordosis. Intervertebral discs demonstrate normal morphology, decreased T2 signal within all cervical disc most consistent with desiccation.  Low signal measuring up to 6 mm in AP dimension posterior to the odontoid process consistent with pannus, can be seen with CPPD without erosions or edema. Cervical spinal cord appears normal morphology and signal characteristics though, motion degrades sensitivity for subtle cord signal abnormality. No syrinx. Included prevertebral and paraspinal soft tissues are normal.  Level by level evaluation:  C2-3:  Moderate facet arthropathy. No disc bulge, canal stenosis or neural foraminal narrowing.  C3-4: Annular bulging, uncovertebral hypertrophy. Moderate LEFT greater than RIGHT facet arthropathy. Very mild  canal stenosis. Mild LEFT neural foraminal narrowing.  C4-5: Small broad-based disc bulge, mild to moderate facet arthropathy and uncovertebral hypertrophy. Moderate canal stenosis. Moderate to severe LEFT greater than RIGHT neural foraminal narrowing.  C5-6: Small broad-based disc bulge, uncovertebral hypertrophy and moderate facet arthropathy. Mild canal stenosis. Moderate RIGHT, moderate to severe LEFT neural foraminal narrowing.  C6-7: Severely limited by motion and habitus. No definite disc bulge. At least moderate facet arthropathy without canal stenosis or neural foraminal narrowing.  C7-T1: No definite disc bulge, canal stenosis. At least mild facet arthropathy without neural foraminal narrowing.  MRI THORACIC SPINE FINDINGS  Moderate acute appearing T2 compression fracture with 30-50% height loss. Mild acute appearing T3 compression fracture. The remaining thoracic vertebral bodies appear intact. Maintenance of thoracic kyphosis. Intervertebral discs demonstrate relatively preserved morphology and signal characteristics. Minimal subacute to chronic discogenic endplate change of lower thoracic spine. 13 mm T6 hemangioma. Scattered chronic Schmorl's nodes.  Thoracic spinal cord appears normal in morphology and signal characteristics of the level the conus medullaris which terminates at T12-L1. Motion decreases sensitivity for potential cord signal abnormality.  No significant disc bulge, canal stenosis or neural foraminal narrowing any thoracic level.  IMPRESSION: Please note, examination was ordered with contrast though, after noncontrast portion, patient was unable to tolerate imaging due to pain and contrast-enhanced sequences not performed.  MRI CERVICAL SPINE: Acute moderate C7 compression fracture. No malalignment.  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 Multilevel neural foraminal narrowing: Moderate to severe  on the LEFT at C4-5 and C5-6.  MRI THORACIC SPINE: Acute moderate T2 compression fracture. Mild acute T3 compression fracture. No malalignment.   Electronically Signed   By: Awilda Metro   On: 10/08/2014 04:30    Cervical CT wo contrast:  Results for orders placed during the hospital encounter of 09/13/16  CT Cervical Spine Wo Contrast   Narrative CLINICAL DATA:  Fall.  EXAM: CT HEAD WITHOUT CONTRAST  CT CERVICAL SPINE WITHOUT CONTRAST  TECHNIQUE: Multidetector CT imaging of the head and cervical spine was performed following the standard protocol without intravenous contrast. Multiplanar CT image reconstructions of the cervical spine were also generated.  COMPARISON:  CT head 07/02/2016. CT head and cervical spine 03/26/2016  FINDINGS: CT HEAD FINDINGS  Brain: No evidence of acute infarction, hemorrhage, hydrocephalus, extra-axial collection or mass lesion/mass effect. Diffuse cerebral atrophy. Ventricular dilatation consistent with central atrophy. Low-attenuation changes in the deep white matter consistent small vessel ischemia. Calcification in the basal ganglia.  Vascular: No hyperdense vessel or unexpected calcification.  Skull: Normal. Negative for fracture or focal lesion.  Sinuses/Orbits: No acute finding.  Other: No significant change since previous study.  CT CERVICAL SPINE FINDINGS  Alignment: Normal alignment of the cervical spine and facet joints.  Skull base and vertebrae: Skullbase appears intact. Endplate compression deformities at C7 and T2 without change since prior study. No focal bone lesion or bone destruction.  Soft tissues and spinal canal: No prevertebral fluid or swelling. No visible canal hematoma.  Disc levels: Disc space heights are preserved. Degenerative changes in the cervical facet joints and at C1 -2.  Upper chest: Visualized lung apices are clear.  Other: Degenerative changes in the temporomandibular  joints.  IMPRESSION: No acute intracranial abnormalities. Chronic atrophy and small vessel ischemic changes.  Normal alignment  of the cervical spine. Chronic endplate compression deformities at C7 and T2 unchanged since previous study, likely representing osteoporosis. Mild degenerative changes. No acute displaced fractures identified.   Electronically Signed   By: Burman Nieves M.D.   On: 09/13/2016 04:23    Cervical DG 2-3 views:  Results for orders placed during the hospital encounter of 09/28/14  DG Cervical Spine 2 or 3 views   Narrative CLINICAL DATA:  Posterior neck pain, status post fall 4 days ago  EXAM: CERVICAL SPINE - 2-3 VIEW  COMPARISON:  Cervical spine CT scan of September 22, 2014  FINDINGS: The cervical vertebral bodies are preserved in height. The disc space heights are well maintained. There is mild multi level facet joint hypertrophy. There is degenerative change of the atlanto-dens articulation. The prevertebral soft tissue spaces are normal.  IMPRESSION: There is no acute bony abnormality of the cervical spine.   Electronically Signed   By: David  [place] M.D.   On: 09/28/2014 15:49     Shoulder Imaging: Shoulder-L DG:  Results for orders placed during the hospital encounter of 03/26/16  DG Shoulder Left   Narrative CLINICAL DATA:  Syncopal episode, falling onto the left shoulder was subsequent pain.  EXAM: LEFT SHOULDER - 2+ VIEW  COMPARISON:  04/24/2015  FINDINGS: Humeral head is properly located. No evidence of regional fracture. No degenerative change. Normal humeral acromial distance.  IMPRESSION: Negative radiographs.   Electronically Signed   By: Paulina Fusi M.D.   On: 03/26/2016 15:26     Thoracic Imaging: Thoracic MR wo contrast:  Results for orders placed during the hospital encounter of 10/07/14  MR Thoracic Spine Wo Contrast   Narrative CLINICAL DATA:  Known cervical spine fracture, new onset fever. Here for  assessment of sepsis.  EXAM: MRI CERVICAL AND LUMBAR SPINE WITHOUT CONTRAST  TECHNIQUE: Multiplanar and multiecho pulse sequences of the cervical spine, to include the craniocervical junction and cervicothoracic junction, and thoracic spine, were obtained without intravenous contrast. Please note, motion degraded examination. Per technologist note, patient did not wish to receive intravenous contrast due to pain, examination is noncontrast.  COMPARISON:  CT of the cervical spine Oct 05, 2014  FINDINGS: MRI CERVICAL SPINE FINDINGS  Motion degraded examination.  Moderate C7 compression fracture, low T1 and bright T2 signal with 30-50% height loss. No retropulsed bony fragments. Remaining cervical vertebral bodies appear intact. Maintenance of cervical lordosis. Intervertebral discs demonstrate normal morphology, decreased T2 signal within all cervical disc most consistent with desiccation.  Low signal measuring up to 6 mm in AP dimension posterior to the odontoid process consistent with pannus, can be seen with CPPD without erosions or edema. Cervical spinal cord appears normal morphology and signal characteristics though, motion degrades sensitivity for subtle cord signal abnormality. No syrinx. Included prevertebral and paraspinal soft tissues are normal.  Level by level evaluation:  C2-3: Moderate facet arthropathy. No disc bulge, canal stenosis or neural foraminal narrowing.  C3-4: Annular bulging, uncovertebral hypertrophy. Moderate LEFT greater than RIGHT facet arthropathy. Very mild canal stenosis. Mild LEFT neural foraminal narrowing.  C4-5: Small broad-based disc bulge, mild to moderate facet arthropathy and uncovertebral hypertrophy. Moderate canal stenosis. Moderate to severe LEFT greater than RIGHT neural foraminal narrowing.  C5-6: Small broad-based disc bulge, uncovertebral hypertrophy and moderate facet arthropathy. Mild canal stenosis. Moderate  RIGHT, moderate to severe LEFT neural foraminal narrowing.  C6-7: Severely limited by motion and habitus. No definite disc bulge. At least moderate facet arthropathy without canal stenosis or neural  foraminal narrowing.  C7-T1: No definite disc bulge, canal stenosis. At least mild facet arthropathy without neural foraminal narrowing.  MRI THORACIC SPINE FINDINGS  Moderate acute appearing T2 compression fracture with 30-50% height loss. Mild acute appearing T3 compression fracture. The remaining thoracic vertebral bodies appear intact. Maintenance of thoracic kyphosis. Intervertebral discs demonstrate relatively preserved morphology and signal characteristics. Minimal subacute to chronic discogenic endplate change of lower thoracic spine. 13 mm T6 hemangioma. Scattered chronic Schmorl's nodes.  Thoracic spinal cord appears normal in morphology and signal characteristics of the level the conus medullaris which terminates at T12-L1. Motion decreases sensitivity for potential cord signal abnormality.  No significant disc bulge, canal stenosis or neural foraminal narrowing any thoracic level.  IMPRESSION: Please note, examination was ordered with contrast though, after noncontrast portion, patient was unable to tolerate imaging due to pain and contrast-enhanced sequences not performed.  MRI CERVICAL SPINE: Acute moderate C7 compression fracture. No malalignment.  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 Multilevel neural foraminal narrowing: Moderate to severe on the LEFT at C4-5 and C5-6.  MRI THORACIC SPINE: Acute moderate T2 compression fracture. Mild acute T3 compression fracture. No malalignment.   Electronically Signed   By: Awilda Metro   On: 10/08/2014 04:30    Thoracic CT wo contrast:  Results for orders placed during the hospital encounter of 02/15/15  CT Thoracic Spine Wo Contrast   Narrative CLINICAL DATA:   Larey Seat out of bed onto hardwood floors, head neck pain, recent upper thoracic fracture, new fall  EXAM: CT THORACIC AND LUMBAR SPINE WITHOUT CONTRAST  TECHNIQUE: Multidetector CT imaging of the thoracic and lumbar spine was performed without contrast. Multiplanar CT image reconstructions were also generated.  COMPARISON:  CT thoracic spine 01/26/2015, lumbar spine radiographs 09/22/2014, CT abdomen and pelvis 10/17/2012  FINDINGS: CT THORACIC SPINE FINDINGS  Diffuse osseous demineralization.  Scattered atherosclerotic calcifications with ascending thoracic aorta 3.9 cm transverse image 42.  Additional atherosclerotic calcifications at coronary arteries and proximal great vessels.  No thoracic adenopathy.  Calcified granuloma LEFT upper lobe image 19.  Anterior compression fracture of T2 unchanged, involving both superior and inferior endplates.  Superior endplate compression fracture T3 unchanged.  Minimal concavity superior endplate T4 stable.  No new fracture, subluxation or bone destruction.  Visualized portions of ribs and scapulae unremarkable.  CT LUMBAR SPINE FINDINGS  Small calcified renal artery aneurysms bilaterally 10 mm RIGHT hand 8 mm LEFT.  Atherosclerotic calcifications aorta.  LEFT adrenal nodule 14 x 10 mm stable since 10/17/2012.  Based on numbering of ribs and thoracic spine, only 4 lumbar type vertebral bodies are identified.  No lumbar fracture or subluxation.  Mild scattered degenerative disc disease changes.  Inferior endplate concavities at L2, L3 and L4 are stable from prior radiographs.  SI joints symmetric.  Diffusely bulging discs at L1-L2, L2-L3, and L3-L4.  Significant multifactorial spinal stenosis at L2-L3.  IMPRESSION: CT THORACIC SPINE IMPRESSION  Stable compression fractures at T2 and T3.  Stable superior endplate concavity T4.  Osseous demineralization with scattered degenerative disc disease changes.  No  acute abnormalities.  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  Recommend annual imaging followup by CTA or MRA. This recommendation follows 2010 ACCF/AHA/AATS/ACR/ASA/SCA/SCAI/SIR/STS/SVM Guidelines for the Diagnosis and Management of Patients with Thoracic Aortic Disease. Circulation.2010; 121: Y865-H846  CT LUMBAR SPINE IMPRESSION  No acute lumbar spine abnormalities.  Stable inferior endplate concavities at L2, L3, and L4.  Bulging discs at multiple  levels with significant multifactorial spinal stenosis likely present at L2-L3.  Small BILATERAL calcified renal artery aneurysms.   Electronically Signed   By: Ulyses Southward M.D.   On: 02/15/2015 20:46    Thoracic DG 2-3 views:  Results for orders placed during the hospital encounter of 05/22/15  DG Thoracic Spine 2 View   Narrative CLINICAL DATA:  76 year old female with acute on chronic back pain after falling 5 days previously. Pain localizes to the thoracic spine.  EXAM: THORACIC SPINE 2 VIEWS  COMPARISON:  Prior CT scan of the thoracic spine 02/15/2015  FINDINGS: There is no evidence of thoracic spine fracture. Alignment is normal. No other significant bone abnormalities are identified. Atherosclerotic vascular calcifications present in both renal arteries proximally.  IMPRESSION: Negative.   Electronically Signed   By: Malachy Moan M.D.   On: 05/22/2015 16:41     Results for orders placed during the hospital encounter of 03/29/16  DG Thoracic Spine W/Swimmers   Narrative CLINICAL DATA:  Larey Seat 3 days ago, mid back pain, history osteoporosis, osteoarthritis  EXAM: THORACIC SPINE - 3 VIEWS  COMPARISON:  Chest radiograph 01/23/2016  FINDINGS: Osseous demineralization.  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  Minimal biconvex scoliosis.  Mild chronic superior endplate height loss of T2 vertebral body.  Minimal superior endplate concavities at T3 and T4,  also chronic.  No acute fracture, subluxation, or bone destruction.  IMPRESSION: Osseous mineralization.  Chronic superior endplate changes at T2-T4 as above.  No acute abnormalities.   Electronically Signed   By: Ulyses Southward M.D.   On: 03/29/2016 12:16    Lumbosacral Imaging:  Results for orders placed during the hospital encounter of 03/28/14  MR Lumbar Spine Wo Contrast   Narrative CLINICAL DATA:  Low back pain, 4 years duration. Symptoms are worsening since a fall in a retail store 1 year ago. Pain in weakness in both legs.  EXAM: MRI LUMBAR SPINE WITHOUT CONTRAST  TECHNIQUE: Multiplanar, multisequence MR imaging of the lumbar spine was performed. No intravenous contrast was administered.  COMPARISON:  CT 10/17/2012.  Radiography 09/17/2012.  FINDINGS: There are minimal, non-compressive disc bulges at T12-L1, L1-2 and L2-3. No stenosis. The distal cord and conus are normal with the conus tip at upper L2.  L3-4: Disc degeneration with shallow broad-based protrusion of disc material. Bilateral facet degeneration with facet and ligamentous hypertrophy. Stenosis of both lateral recesses and foramina, right more than left. Neural compression could occur at this level. Additionally, based on the facet arthropathy, there could be motion with flexion and extension.  L4-5: Mild bulging of the disc. Mild bilateral facet arthropathy. No compressive narrowing of the canal or foramina.  L5-S1: Mild bulging of the disc. Mild facet degeneration. No stenosis.  IMPRESSION: The significant abnormalities are probably at the L3-4 level. There is bilateral facet arthropathy which could be a cause of back pain or referred facet syndrome pain. There is shallow broad-based protrusion of disc material. There is stenosis of the lateral recesses and neural foramina right more than left. Neural compression could occur at this level. Additionally, there could be worsening of this  pattern with flexion extension.   Electronically Signed   By: Paulina Fusi M.D.   On: 03/28/2014 16:52     Results for orders placed during the hospital encounter of 02/15/15  CT Lumbar Spine Wo Contrast   Narrative CLINICAL DATA:  Larey Seat out of bed onto hardwood floors, head neck pain, recent upper thoracic fracture, new fall  EXAM:  CT THORACIC AND LUMBAR SPINE WITHOUT CONTRAST  TECHNIQUE: Multidetector CT imaging of the thoracic and lumbar spine was performed without contrast. Multiplanar CT image reconstructions were also generated.  COMPARISON:  CT thoracic spine 01/26/2015, lumbar spine radiographs 09/22/2014, CT abdomen and pelvis 10/17/2012  FINDINGS: CT THORACIC SPINE FINDINGS  Diffuse osseous demineralization.  Scattered atherosclerotic calcifications with ascending thoracic aorta 3.9 cm transverse image 42.  Additional atherosclerotic calcifications at coronary arteries and proximal great vessels.  No thoracic adenopathy.  Calcified granuloma LEFT upper lobe image 19.  Anterior compression fracture of T2 unchanged, involving both superior and inferior endplates.  Superior endplate compression fracture T3 unchanged.  Minimal concavity superior endplate T4 stable.  No new fracture, subluxation or bone destruction.  Visualized portions of ribs and scapulae unremarkable.  CT LUMBAR SPINE FINDINGS  Small calcified renal artery aneurysms bilaterally 10 mm RIGHT hand 8 mm LEFT.  Atherosclerotic calcifications aorta.  LEFT adrenal nodule 14 x 10 mm stable since 10/17/2012.  Based on numbering of ribs and thoracic spine, only 4 lumbar type vertebral bodies are identified.  No lumbar fracture or subluxation.  Mild scattered degenerative disc disease changes.  Inferior endplate concavities at L2, L3 and L4 are stable from prior radiographs.  SI joints symmetric.  Diffusely bulging discs at L1-L2, L2-L3, and L3-L4.  Significant multifactorial  spinal stenosis at L2-L3.  IMPRESSION: CT THORACIC SPINE IMPRESSION  Stable compression fractures at T2 and T3.  Stable superior endplate concavity T4.  Osseous demineralization with scattered degenerative disc disease changes.  No acute abnormalities.  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  Recommend annual imaging followup by CTA or MRA. This recommendation follows 2010 ACCF/AHA/AATS/ACR/ASA/SCA/SCAI/SIR/STS/SVM Guidelines for the Diagnosis and Management of Patients with Thoracic Aortic Disease. Circulation.2010; 121: U981-X914  CT LUMBAR SPINE IMPRESSION  No acute lumbar spine abnormalities.  Stable inferior endplate concavities at L2, L3, and L4.  Bulging discs at multiple levels with significant multifactorial spinal stenosis likely present at L2-L3.  Small BILATERAL calcified renal artery aneurysms.   Electronically Signed   By: Ulyses Southward M.D.   On: 02/15/2015 20:46    Results for orders placed during the hospital encounter of 09/22/14  DG Lumbar Spine Complete   Narrative CLINICAL DATA:  Fall 2 days ago following syncopal episode. Neck pain and lower back pain. Pain radiating down left leg.  EXAM: LUMBAR SPINE - COMPLETE 4+ VIEW  COMPARISON:  Chest radiograph 229 1,016  FINDINGS: There is mild endplate spurring. There is no evidence acute loss of vertebral body height or disc height. No subluxation.  IMPRESSION: No evidence acute injury to the lumbar spine.   Electronically Signed   By: Genevive Bi M.D.   On: 09/22/2014 18:45     Hip Imaging:  Results for orders placed during the hospital encounter of 11/13/12  MR Hip Right Wo Contrast   Narrative *RADIOLOGY REPORT*  Clinical Data: Right hip pain since a fall 2 months ago.  MRI OF THE RIGHT HIP WITHOUT CONTRAST  Technique:  Multiplanar, multisequence MR imaging was performed. No intravenous contrast was administered.  Comparison: CT scan of  the abdomen and pelvis dated 10/17/2012  Findings: There is moderate osteoarthritis of the right hip with diffuse joint space narrowing with subchondral cysts in the acetabulum and femoral head.  There is also thickening of the synovium of the right hip as compared to the left.  There is no hip effusion, however.  The adjacent soft tissues are normal with no  bursitis or muscle atrophy or edema.  The visualized portion of the pelvis is normal.  IMPRESSION:  1.  Moderate osteoarthritis of the right hip with synovial hypertrophy consistent with chronic inflammation. 2.  No acute osseous abnormality.   Original Report Authenticated By: Francene Boyers, M.D.    Hip-R CT wo contrast:  Results for orders placed during the hospital encounter of 09/13/16  CT HIP RIGHT WO CONTRAST   Narrative CLINICAL DATA:  Right hip fracture  EXAM: CT OF THE RIGHT HIP WITHOUT CONTRAST  TECHNIQUE: Multidetector CT imaging of the right hip was performed according to the standard protocol. Multiplanar CT image reconstructions were also generated.  COMPARISON:  09/13/2016 radiograph  FINDINGS: Bones/Joint/Cartilage  Intertrochanteric right hip fracture noted with mild comminution along the greater trochanter an along the base of the left femoral neck. The fracture extends into the upper margin of the lesser trochanter. There is likely in the intra-articular portion involving the Vasa cervical region.  Mild spurring of the right femoral head and acetabulum with confluent subcortical cysts along the posterosuperior acetabulum.  Ligaments  Suboptimally assessed by CT.  Muscles and Tendons  Unremarkable  Soft tissues  Mild external iliac artery atherosclerotic calcification.  IMPRESSION: 1. Mildly displaced intertrochanteric fracture with comminuted portion along the greater trochanter and minimal comminution along the basicervical region of the left femoral neck. There could be a small  component of intra-articular basicervical involvement. Mild apex anterior angulation of the dominant fracture site as shown on image 31/7.   Electronically Signed   By: Gaylyn Rong M.D.   On: 09/13/2016 08:16    Hip-L DG 2-3 views:  Results for orders placed during the hospital encounter of 03/26/16  DG Hip Unilat With Pelvis 2-3 Views Left   Narrative CLINICAL DATA:  Syncopal episode falling onto the left side with left-sided pain.  EXAM: DG HIP (WITH OR WITHOUT PELVIS) 2-3V LEFT  COMPARISON:  None.  FINDINGS: No pelvic fracture. No hip fracture. No significant degenerative change.  IMPRESSION: Negative radiographs.   Electronically Signed   By: Paulina Fusi M.D.   On: 03/26/2016 15:27      Knee Imaging:  Knee-L MR w contrast:  Results for orders placed during the hospital encounter of 09/04/16  MR KNEE LEFT WO CONTRAST   Narrative CLINICAL DATA:  Feels like the knee is popping out.  Pain.  EXAM: MRI OF THE LEFT KNEE WITHOUT CONTRAST  TECHNIQUE: Multiplanar, multisequence MR imaging of the knee was performed. No intravenous contrast was administered.  COMPARISON:  None.  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 No obvious periarticular fluid collection. No osteolysis. No acute fracture or dislocation. Overall alignment is anatomic. No Baker cyst.  Ligaments  Collateral ligaments are obscured by susceptibility artifact.  Muscles and Tendons No muscle signal abnormality. No muscle atrophy or edema. No intramuscular fluid collection or hematoma. Patellar tendon and quadriceps tendon are intact.  Soft tissue No fluid collection or hematoma. No soft tissue mass. Popliteal fossa is normal. Normal neurovascular bundles.  IMPRESSION: 1. Left total knee arthroplasty with susceptibility artifact partially obscuring the adjacent  soft tissue osseous structures. No evidence of hardware failure or complication. 2. No Baker cyst.   Electronically Signed   By: Elige Ko   On: 09/04/2016 13:51    Knee-R DG 4 views:  Results for orders placed during the hospital encounter of 09/27/16  DG Knee Complete 4 Views Right  Narrative CLINICAL DATA:  76 year old female with a history of right knee pain. Prior fracture  EXAM: RIGHT KNEE - COMPLETE 4+ VIEW  COMPARISON:  09/13/2016  FINDINGS: Surgical changes of right knee arthroplasty. No evidence of perihardware fracture. No joint effusion. No focal soft tissue swelling. No displaced fracture of the visualized femur or proximal lower extremity.  IMPRESSION: No acute bony abnormality.  Surgical changes of prior knee arthroplasty.   Electronically Signed   By: Gilmer Mor D.O.   On: 09/27/2016 13:32    Knee-L DG 4 views:  Results for orders placed during the hospital encounter of 11/14/07  DG Knee Complete 4 Views Left   Narrative Clinical Data: 76 year old female status post knee dislocation. Pain.   LEFT KNEE - COMPLETE 4+ VIEW   Comparison: 04/29/2007.   Findings: The patient is status post left total knee arthroplasty. The knee is located.  A moderate sized joint effusion is seen. There is no evidence for acute fracture.   IMPRESSION:   1.  Moderate sized joint effusion without underlying fracture dislocation. 2.  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  Provider: Trudie Buckler    Note: Available results from prior imaging studies were reviewed.        ROS  Cardiovascular History: Negative for hypertension, coronary artery diseas, myocardial infraction, anticoagulant therapy or heart failure Pulmonary or Respiratory History: Lung problems Neurological History: Stroke and Incontinence Review of Past Neurological Studies:  Results for orders placed or performed during the hospital encounter of 09/13/16  CT Head Wo  Contrast   Narrative   CLINICAL DATA:  Fall.  EXAM: CT HEAD WITHOUT CONTRAST  CT CERVICAL SPINE WITHOUT CONTRAST  TECHNIQUE: Multidetector CT imaging of the head and cervical spine was performed following the standard protocol without intravenous contrast. Multiplanar CT image reconstructions of the cervical spine were also generated.  COMPARISON:  CT head 07/02/2016. CT head and cervical spine 03/26/2016  FINDINGS: CT HEAD FINDINGS  Brain: No evidence of acute infarction, hemorrhage, hydrocephalus, extra-axial collection or mass lesion/mass effect. Diffuse cerebral atrophy. Ventricular dilatation consistent with central atrophy. Low-attenuation changes in the deep white matter consistent small vessel ischemia. Calcification in the basal ganglia.  Vascular: No hyperdense vessel or unexpected calcification.  Skull: Normal. Negative for fracture or focal lesion.  Sinuses/Orbits: No acute finding.  Other: No significant change since previous study.  CT CERVICAL SPINE FINDINGS  Alignment: Normal alignment of the cervical spine and facet joints.  Skull base and vertebrae: Skullbase appears intact. Endplate compression deformities at C7 and T2 without change since prior study. No focal bone lesion or bone destruction.  Soft tissues and spinal canal: No prevertebral fluid or swelling. No visible canal hematoma.  Disc levels: Disc space heights are preserved. Degenerative changes in the cervical facet joints and at C1 -2.  Upper chest: Visualized lung apices are clear.  Other: Degenerative changes in the temporomandibular joints.  IMPRESSION: No acute intracranial abnormalities. Chronic atrophy and small vessel ischemic changes.  Normal alignment of the cervical spine. Chronic endplate compression deformities at C7 and T2 unchanged since previous study, likely representing osteoporosis. Mild degenerative changes. No acute displaced fractures  identified.   Electronically Signed   By: Burman Nieves M.D.   On: 09/13/2016 04:23   Results for orders placed or performed during the hospital encounter of 06/11/13  MR Brain Wo Contrast   Narrative   [facility]  NEUROIMAGING REPORT   STUDY DATE: 06/11/13 PATIENT NAME: Ruthann Cancer DOB:  10/09/1940 MRN: [id number]  ORDERING CLINICIAN: Joycelyn Schmid, MD  CLINICAL HISTORY: 76 year old female with memory loss and gait difficulty.  EXAM: MRI brain (without)  TECHNIQUE: MRI of the brain without contrast was obtained utilizing 5 mm  axial slices with T1, T2, T2 flair, SWI and diffusion weighted views.  T1  sagittal and T2 coronal views were obtained. CONTRAST: no IMAGING SITE: [facility] [address] (1.5 Tesla MRI)    FINDINGS:  No abnormal lesions are seen on diffusion-weighted views to suggest acute  ischemia. The cortical sulci, fissures and cisterns are normal in size and  appearance. Lateral, third and fourth ventricle are normal in size and  appearance. No extra-axial fluid collections are seen. No evidence of mass  effect or midline shift. Few punctate foci of non-specific gliosis in the  periventricular and subcortical white matter, likely chronic small vessel  ischemic disease.  On sagittal views the posterior fossa, pituitary gland and corpus callosum  are unremarkable. No evidence of intracranial hemorrhage on SWI views.  Mineralization in the left putamen. Dural calcifications along the falx  cerebri. The orbits and their contents, paranasal sinuses and calvarium  are unremarkable.  Intracranial flow voids are present.     Impression   Mildly abnormal MRI brain (without) demonstrating: 1. Few punctate foci of non-specific gliosis in the periventricular and  subcortical white matter, likely chronic small vessel ischemic disease.  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 2. No  acute findings. 3. No change from MRI on 03/30/12.   INTERPRETING PHYSICIAN:  Suanne Marker, MD Certified in Neurology, Neurophysiology and Neuroimaging  [facility] [address] [place], [place] [address] [phone]   Results for orders placed or performed during the hospital encounter of 03/30/12  MR MRA HEAD WO CONTRAST   Narrative   This examination was performed at [facility] at [facility]. The interpretation will be provided by [facility].  BUN and creatinine were obtained on site at [facility] at [address] Results:  BUN 6 mg/dL,  Creatinine 1.4 mg/dL. Estimated GFR 38.   Original Report Authenticated By: Erskine Speed, M.D.    Psychological-Psychiatric History: Depression Gastrointestinal History: Hiatal hernia and Reflux or heatburn Genitourinary History: Negative for nephrolithiasis, hematuria, renal failure or chronic kidney disease Hematological History: Negative for anticoagulant therapy, anemia, bruising or bleeding easily, hemophilia, sickle cell disease or trait, thrombocytopenia or coagulupathies Endocrine History: Negative for diabetes or thyroid disease Rheumatologic History: Negative for lupus, osteoarthritis, rheumatoid arthritis, myositis, polymyositis or fibromyagia Musculoskeletal History: Negative for myasthenia gravis, muscular dystrophy, multiple sclerosis or malignant hyperthermia Work History: Disabled  Allergies  Ms. Merkel is allergic to ampicillin-sulbactam sodium; ampicillin; toradol [ketorolac tromethamine]; tramadol; ambien [zolpidem tartrate]; cephalexin; ketorolac; percocet [oxycodone-acetaminophen]; sulfamethoxazole-trimethoprim; and tape.  Laboratory Chemistry  Inflammation Markers Lab Results  Component Value Date   CRP 7.3 (H) 10/07/2014   ESRSEDRATE 7 10/07/2014   (CRP: Acute Phase) (ESR: Chronic Phase) Renal Function Markers Lab  Results  Component Value Date   BUN 9 10/23/2016   CREATININE 1.22 (H) 10/23/2016   GFRAA 49 (L) 10/23/2016   GFRNONAA 42 (L) 10/23/2016   Hepatic Function Markers Lab Results  Component Value Date   AST 25 04/15/2016   ALT 17 04/15/2016   ALBUMIN 4.2 04/15/2016   ALKPHOS 85 04/15/2016   Electrolytes Lab Results  Component Value Date   NA 138 10/23/2016  K 3.8 10/23/2016   CL 104 10/23/2016   CALCIUM 9.5 10/23/2016   MG 2.2 05/22/2015   Neuropathy Markers No results found for: [id number] Bone Pathology Markers Lab Results  Component Value Date   ALKPHOS 85 04/15/2016   CALCIUM 9.5 10/23/2016   Coagulation Parameters Lab Results  Component Value Date   INR 1.00 09/13/2016   LABPROT 13.2 09/13/2016   APTT 34 01/04/2011   PLT 205 10/23/2016   Cardiovascular Markers Lab Results  Component Value Date   BNP 60.2 01/23/2016   HGB 12.3 10/23/2016   HCT 39.7 10/23/2016   Note: Lab results reviewed.  PFSH  Drug: Ms. Santoso  reports that she does not use drugs. Alcohol:  reports that she does not drink alcohol. Tobacco:  reports that she has quit smoking. She has a 45.00 pack-year smoking history. She has never used smokeless tobacco. Medical:  has a past medical history of Anemia; B12 deficiency; Bladder incontinence; Blind left eye; Cataract; Cervical spine fracture (HCC); Compression fracture; COPD (chronic obstructive pulmonary disease) (HCC); DDD (degenerative disc disease), lumbar; Depression; GERD (gastroesophageal reflux disease); Hiatal hernia; Hyperlipidemia; Hypertension; Hypertensive kidney disease, malignant (11/07/2016); Macular degeneration; Osteoarthritis; Osteoporosis; Reactive airway disease; Rupture of bowel (HCC); Stroke [facility]); and Stroke (HCC). Family: family history includes Heart attack in her father; Heart failure in her mother.  Past Surgical History:  Procedure Laterality Date  . ABDOMINAL HYSTERECTOMY    . ABDOMINAL SURGERY    . APPENDECTOMY     . BLADDER SURGERY    . CHOLECYSTECTOMY    . COLON SURGERY    . EYE SURGERY     cataracts  . INTRAMEDULLARY (IM) NAIL INTERTROCHANTERIC Right 09/13/2016   Procedure: INTRAMEDULLARY (IM) NAIL INTERTROCHANTRIC RIGHT;  Surgeon: Tarry Kos, MD;  Location: [facility] OR;  Service: Orthopedics;  Laterality: Right;  . JOINT REPLACEMENT    . KNEE SURGERY    . OSTOMY    . RECTOCELE REPAIR     Active Ambulatory Problems    Diagnosis Date Noted  . Gait difficulty 05/03/2013  . Memory loss 05/03/2013  . Dizziness and giddiness 06/18/2013  . Chest pain 07/29/2013  . Hypercholesteremia 07/30/2013  . HTN (hypertension) 07/30/2013  . GERD (gastroesophageal reflux disease) 07/30/2013  . Fever 10/05/2014  . Hypoxia 10/05/2014  . Bacteremia 10/07/2014  . Leukopenia 10/07/2014  . Thrombocytopenia (HCC) 10/07/2014  . Sepsis (HCC) 10/08/2014  . Diffuse myofascial pain syndrome 03/24/2015  . Syncope, non cardiac 05/22/2015  . Spinal stenosis in cervical region 06/27/2015  . Chronic pain of both shoulders 08/12/2016  . Hip fracture (HCC) 09/13/2016  . Fall   . Displaced intertrochanteric fracture of right femur, initial encounter for closed fracture (HCC) 09/13/2016  . Age-related osteoporosis with current pathological fracture with routine healing 09/20/2016  . Anxiety 06/15/2014  . 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 Chronic left shoulder pain 02/28/2016  . Low back pain (teritary) (Right > Left) 10/26/2015  . CAFL (chronic airflow limitation) (HCC) 11/07/2016  . Apoplectic vertigo 06/15/2014  . Duodenogastric reflux 06/15/2014  . History of stroke 09/20/2016  . Hoarseness 01/12/2016  . Benign essential HTN 06/15/2014  . Knee pain (primary) (right) 09/10/2012  . Disc disorder 11/07/2016  . Depression, major, in partial remission (HCC) 11/07/2016  . Major depression in remission (HCC) 09/20/2016  . 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  Pharyngoesophageal dysphagia 01/12/2016  . Status post revision of total replacement of right knee 10/26/2015  . 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 Infection of the upper respiratory tract 06/15/2014  . Bed wetting 11/07/2016  . UTI (urinary tract infection) 06/25/2015  . Wrist pain, acute 09/10/2012  . Long term current use of opiate analgesic 11/07/2016  . Long term prescription opiate use 11/07/2016  . Chronic pain syndrome 11/07/2016  . Neck pain (secondary) (left > right) 11/07/2016  . Hip pain 11/07/2016  . Rib pain 11/07/2016   Resolved Ambulatory Problems    Diagnosis Date Noted  . No Resolved Ambulatory Problems   Past Medical History:  Diagnosis Date  . Anemia   . B12 deficiency   . Bladder incontinence   . Blind left eye   . Cataract   . Cervical spine fracture (HCC)   . Compression fracture   . COPD (chronic obstructive pulmonary disease) (HCC)   . DDD (degenerative disc disease), lumbar   . Depression   . GERD (gastroesophageal reflux disease)   . Hiatal hernia   . Hyperlipidemia   . Hypertension   . 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 Macular degeneration   . Osteoarthritis   . Osteoporosis   . Reactive airway disease   . Rupture of bowel (HCC)   . Stroke (HCC)   . Stroke [facility])    Constitutional Exam  General appearance: Well nourished, well developed, and well hydrated. In no apparent acute distress Vitals:   11/07/16 1449  BP: (!) 128/59  Pulse: 83  Resp: 18  Temp: 98.3 F (36.8 C)  TempSrc: Oral  SpO2: 95%  Weight: 185 lb (83.9 kg)  Height: 5\' 5"  (1.651 m)   BMI Assessment: Estimated body mass index is 30.79 kg/m as calculated from the following:   Height as of this encounter: 5\' 5"  (1.651 m).   Weight as of this encounter: 185 lb (83.9 kg).  BMI interpretation table: BMI level Category Range association with higher incidence of chronic pain  <18 kg/m2 Underweight   18.5-24.9 kg/m2 Ideal body weight    25-29.9 kg/m2 Overweight Increased incidence by 20%  30-34.9 kg/m2 Obese (Class I) Increased incidence by 68%  35-39.9 kg/m2 Severe obesity (Class II) Increased incidence by 136%  >40 kg/m2 Extreme obesity (Class III) Increased incidence by 254%   BMI Readings from Last 4 Encounters:  11/07/16 30.79 kg/m  09/27/16 29.05 kg/m  09/13/16 29.05 kg/m  04/15/16 30.73 kg/m   Wt Readings from Last 4 Encounters:  11/07/16 185 lb (83.9 kg)  09/27/16 180 lb (81.6 kg)  09/13/16 180 lb (81.6 kg)  04/15/16 179 lb (81.2 kg)  Psych/Mental status: Alert, oriented x 3 (person, place, & time)       Eyes: PERLA Respiratory: No evidence of acute respiratory distress  Cervical Spine Exam  Inspection: No masses, redness, or swelling Alignment: Symmetrical Functional ROM: Unrestricted ROM      Stability: No instability detected Muscle strength & Tone: Functionally intact Sensory: Unimpaired Palpation: No palpable anomalies              Upper Extremity (UE) Exam    Side: Right upper extremity  Side: Left upper extremity  Inspection: No masses, redness, swelling, or asymmetry. No contractures  Inspection: No masses, redness, swelling, or asymmetry. No contractures  Functional ROM: Unrestricted ROM          Functional ROM: Unrestricted ROM          Muscle strength & Tone: Functionally intact  Muscle strength & Tone: Functionally intact  Sensory: Unimpaired  Sensory: Unimpaired  Palpation: No palpable anomalies              Palpation: No palpable anomalies              Specialized Test(s): Deferred         Specialized Test(s): Deferred          Thoracic Spine Exam  Inspection: No masses, redness, or swelling Alignment: Symmetrical Functional ROM: Unrestricted ROM Stability: No instability detected Sensory: Unimpaired Muscle strength & Tone: No palpable anomalies  Lumbar Spine Exam  Inspection: No masses, redness, or swelling Alignment: Symmetrical Functional ROM: Unrestricted ROM       Stability: No instability detected Muscle strength & Tone: Functionally intact Sensory: Unimpaired Palpation: No palpable anomalies       Provocative Tests: Lumbar Hyperextension and rotation test: evaluation deferred today       Patrick's Maneuver: evaluation deferred today                    Gait & Posture Assessment  Ambulation: Unassisted Gait: Relatively normal for age and body habitus Posture: WNL   Lower Extremity Exam    Side: Right lower extremity  Side: Left lower extremity  Inspection: No masses, redness, swelling, or asymmetry. No contractures  Inspection: No masses, redness, swelling, or asymmetry. No contractures  Functional ROM: Unrestricted ROM          Functional ROM: Unrestricted ROM          Muscle strength & Tone: Functionally intact  Muscle strength & Tone: Functionally intact  Sensory: Unimpaired  Sensory: Unimpaired  Palpation: No palpable anomalies  Palpation: No palpable anomalies   Assessment  Primary Diagnosis & Pertinent Problem List: The primary encounter diagnosis was Chronic pain of right knee (primary). Diagnoses of Neck pain (secondary) (left > right), Chronic bilateral low back pain, with sciatica presence unspecified (tertiary), Pain of right hip joint, Rib pain, Chronic pain syndrome, Long term current use of opiate analgesic, and Long term prescription opiate use were also pertinent to this visit.  Visit Diagnosis: 1. Chronic pain of right knee (primary)   2. Neck pain (secondary) (left > right)   3. Chronic bilateral low back pain, with sciatica presence unspecified (tertiary)   4. Pain of right hip joint   5. Rib pain   6. Chronic pain syndrome   7. Long term current use of opiate analgesic   8. Long term prescription opiate use    Plan of Care  Initial treatment plan:  Please be advised that as per protocol, today's visit has been an evaluation only. We have not taken over the patient's controlled substance management.  Problem-specific  plan: No problem-specific Assessment & Plan notes found for this encounter.  Ordered Lab-work, Procedure(s), Referral(s), & Consult(s): Orders Placed This Encounter  Procedures  . DG Lumbar Spine Complete W/Bend  . DG Knee 1-2 Views Right  . DG HIP UNILAT W OR W/O PELVIS 2-3 VIEWS RIGHT  . DG Ribs Bilateral W/Chest  . Compliance Drug Analysis, Ur  . Comprehensive metabolic panel  . C-reactive protein  . Magnesium  . Sedimentation rate  . Vitamin B12  . 25-Hydroxyvitamin D Lcms D2+D3  . Ambulatory referral to Psychology   Pharmacotherapy: Medications ordered:  No orders of the defined types were placed in this encounter.  Medications administered during this visit: Ms. Rodin had no medications administered during this visit.   Pharmacotherapy under consideration:  Opioid Analgesics: The patient was informed that there is no guarantee that  she would be a candidate for opioid analgesics. The decision will be made following CDC guidelines. This decision will be based on the results of diagnostic studies, as well as Ms. Valone's risk profile.  Membrane stabilizer: To be determined at a later time Muscle relaxant: To be determined at a later time NSAID: To be determined at a later time Other analgesic(s): To be determined at a later time   Interventional therapies under consideration: Ms. Bergman was informed that there is no guarantee that she would be a candidate for interventional therapies. The decision will be based on the results of diagnostic studies, as well as Ms. Brookover's risk profile.  Possible procedure(s): Diagnostic Bilateral cervical epidural steroid injection Diagnostic bilateral cervical lumbar facet injection Possible bilateral cervical radiofrequency ablation Diagnostic bilateral lumbar epidural steroid injection Diagnostic bilateral lumbar facet injection bilateral Possible lumbar facet RFA Diagnostic  right knee genicular nerve block Possible right knee RFA    Provider-requested follow-up: Return for 2nd Visit, w/ Dr. Laban Emperor, after [facility] eval.  Future Appointments Date Time Provider Department Center  11/21/2016 10:30 AM Tarry Kos, MD [facility] None  12/06/2016 10:00 AM Tarry Kos, MD [facility] None  12/18/2016 9:30 AM Penumalli, Glenford Bayley, MD [facility] None    Primary Care Physician: Georgann Housekeeper, MD Location: [facility] Note by:  Date: 11/07/2016; Time: 2:07 PM  Pain Score Disclaimer: We use the NRS-11 scale. This is a self-reported, subjective measurement of pain severity with only modest accuracy. It is used primarily to identify changes within a particular patient. It must be understood that outpatient pain scales are significantly less accurate that those used for research, where they can be applied under ideal controlled circumstances with minimal exposure to variables. In reality, the score is likely to be a combination of pain intensity and pain affect, where pain affect describes the degree of emotional arousal or changes in action readiness caused by the sensory experience of pain. Factors such as social and work situation, setting, emotional state, anxiety levels, expectation, and prior pain experience may influence pain perception and show large inter-individual differences that may also be affected by time variables.  Patient instructions provided during this appointment: Patient Instructions    ____________________________________________________________________________________________  Appointment Policy Summary  It is our goal and responsibility to provide the medical community with assistance in the evaluation and management of patients with chronic pain. Unfortunately our resources are limited. Because we do not have an unlimited amount of time, or available appointments, we are required to closely monitor and manage their use. The following rules exist to maximize their use:  Patient's  responsibilities: 1. Punctuality: You are required to be physically present and registered in our facility at least 30 minutes before your appointment. 2. Tardiness: The cutoff is your appointment time. If you have an appointment scheduled for 10:00 AM and you arrive at 10:01, you will be required to reschedule your appointment.  3. Plan ahead: Always assume that you will encounter traffic on your way in. Plan for it. If you are dependent on a driver, make sure they understand these rules and the need to arrive early. 4. Other appointments and responsibilities: Avoid scheduling any other appointments before or after your pain clinic appointments.  5. Be prepared: Write down everything that you need to discuss with your healthcare provider and give this information to the admitting nurse. Write down the medications that you will need refilled. Bring your pills and bottles (even the empty ones), to all  of your appointments, except for those where a procedure is scheduled. 6. No children or pets: Find someone to take care of them. It is not appropriate to bring them in. 7. Scheduling changes: We request "advanced notification" of any changes or cancellations. 8. Advanced notification: Defined as a time period of more than 24 hours prior to the originally scheduled appointment. This allows for the appointment to be offered to other patients. 9. Rescheduling: When a visit is rescheduled, it will require the cancellation of the original appointment. For this reason they both fall within the category of "Cancellations".  10. Cancellations: They require advanced notification. Any cancellation less than 24 hours before the  appointment will be recorded as a "No Show". 11. No Show: Defined as an unkept appointment where the patient failed to notify or declare to the practice their intention or inability to keep the appointment.  Corrective process for repeat offenders:  1. Tardiness: Three (3) episodes of  rescheduling due to late arrivals will be recorded as one (1) "No Show". 2. Cancellation or reschedule: Three (3) cancellations or rescheduling will be recorded as one (1) "No Show". 3. "No Shows": Three (3) "No Shows" within a 12 month period will result in discharge from the practice.  ____________________________________________________________________________________________  ____________________________________________________________________________________________  Pain Scale  Introduction: The pain score used by this practice is the Verbal Numerical Rating Scale (VNRS-11). This is an 11-point scale. It is for adults and children 10 years or older. There are significant differences in how the pain score is reported, used, and applied. Forget everything you learned in the past and learn this scoring system.  General Information: The scale should reflect your current level of pain. Unless you are specifically asked for the level of your worst pain, or your average pain. If you are asked for one of these two, then it should be understood that it is over the past 24 hours.  Basic Activities of Daily Living (ADL): Personal hygiene, dressing, eating, transferring, and using restroom.  Instructions: Most patients tend to report their level of pain as a combination of two factors, their physical pain and their psychosocial pain. This last one is also known as "suffering" and it is reflection of how physical pain affects you socially and psychologically. From now on, report them separately. From this point on, when asked to report your pain level, report only your physical pain. Use the following table for reference.  Pain Clinic Pain Levels (0-5/10)  Pain Level Score  Description  No Pain 0   Mild pain 1 Nagging, annoying, but does not interfere with basic activities of daily living (ADL). Patients are able to eat, bathe, get dressed, toileting (being able to get on and off the toilet and perform  personal hygiene functions), transfer (move in and out of bed or a chair without assistance), and maintain continence (able to control bladder and bowel functions). Blood pressure and heart rate are unaffected. A normal heart rate for a healthy adult ranges from 60 to 100 bpm (beats per minute).   Mild to moderate pain 2 Noticeable and distracting. Impossible to hide from other people. More frequent flare-ups. Still possible to adapt and function close to normal. It can be very annoying and may have occasional stronger flare-ups. With discipline, patients may get used to it and adapt.   Moderate pain 3 Interferes significantly with activities of daily living (ADL). It becomes difficult to feed, bathe, get dressed, get on and off the toilet or to perform personal  hygiene functions. Difficult to get in and out of bed or a chair without assistance. Very distracting. With effort, it can be ignored when deeply involved in activities.   Moderately severe pain 4 Impossible to ignore for more than a few minutes. With effort, patients may still be able to manage work or participate in some social activities. Very difficult to concentrate. Signs of autonomic nervous system discharge are evident: dilated pupils (mydriasis); mild sweating (diaphoresis); sleep interference. Heart rate becomes elevated (>115 bpm). Diastolic blood pressure (lower number) rises above 100 mmHg. Patients find relief in laying down and not moving.   Severe pain 5 Intense and extremely unpleasant. Associated with frowning face and frequent crying. Pain overwhelms the senses.  Ability to do any activity or maintain social relationships becomes significantly limited. Conversation becomes difficult. Pacing back and forth is common, as getting into a comfortable position is nearly impossible. Pain wakes you up from deep sleep. Physical signs will be obvious: pupillary dilation; increased sweating; goosebumps; brisk reflexes; cold, clammy hands and  feet; nausea, vomiting or dry heaves; loss of appetite; significant sleep disturbance with inability to fall asleep or to remain asleep. When persistent, significant weight loss is observed due to the complete loss of appetite and sleep deprivation.  Blood pressure and heart rate becomes significantly elevated. Caution: If elevated blood pressure triggers a pounding headache associated with blurred vision, then the patient should immediately seek attention at an urgent or emergency care unit, as these may be signs of an impending stroke.    Emergency Department Pain Levels (6-10/10)  Emergency Room Pain 6 Severely limiting. Requires emergency care and should not be seen or managed at an outpatient pain management facility. Communication becomes difficult and requires great effort. Assistance to reach the emergency department may be required. Facial flushing and profuse sweating along with potentially dangerous increases in heart rate and blood pressure will be evident.   Distressing pain 7 Self-care is very difficult. Assistance is required to transport, or use restroom. Assistance to reach the emergency department will be required. Tasks requiring coordination, such as bathing and getting dressed become very difficult.   Disabling pain 8 Self-care is no longer possible. At this level, pain is disabling. The individual is unable to do even the most "basic" activities such as walking, eating, bathing, dressing, transferring to a bed, or toileting. Fine motor skills are lost. It is difficult to think clearly.   Incapacitating pain 9 Pain becomes incapacitating. Thought processing is no longer possible. Difficult to remember your own name. Control of movement and coordination are lost.   The worst pain imaginable 10 At this level, most patients pass out from pain. When this level is reached, collapse of the autonomic nervous system occurs, leading to a sudden drop in blood pressure and heart rate. This in  turn results in a temporary and dramatic drop in blood flow to the brain, leading to a loss of consciousness. Fainting is one of the body's self defense mechanisms. Passing out puts the brain in a calmed state and causes it to shut down for a while, in order to begin the healing process.    Summary: 1. Refer to this scale when providing [place] with your pain level. 2. Be accurate and careful when reporting your pain level. This will help with your care. 3. Over-reporting your pain level will lead to loss of credibility. 4. Even a level of 1/10 means that there is pain and will be treated at our facility. 5.  High, inaccurate reporting will be documented as "Symptom Exaggeration", leading to loss of credibility and suspicions of possible secondary gains such as obtaining more narcotics, or wanting to appear disabled, for fraudulent reasons. 6. Only pain levels of 5 or below will be seen at our facility. 7. Pain levels of 6 and above will be sent to the Emergency Department and the appointment cancelled.  Please get your x-rays done as soon as possible. ____________________________________________________________________________________________

## 2016-11-07 NOTE — Patient Instructions (Addendum)
____________________________________________________________________________________________  Appointment Policy Summary  It is our goal and responsibility to provide the medical community with assistance in the evaluation and management of patients with chronic pain. Unfortunately our resources are limited. Because we do not have an unlimited amount of time, or available appointments, we are required to closely monitor and manage their use. The following rules exist to maximize their use:  Patient's responsibilities: 1. Punctuality: You are required to be physically present and registered in our facility at least 30 minutes before your appointment. 2. Tardiness: The cutoff is your appointment time. If you have an appointment scheduled for 10:00 AM and you arrive at 10:01, you will be required to reschedule your appointment.  3. Plan ahead: Always assume that you will encounter traffic on your way in. Plan for it. If you are dependent on a driver, make sure they understand these rules and the need to arrive early. 4. Other appointments and responsibilities: Avoid scheduling any other appointments before or after your pain clinic appointments.  5. Be prepared: Write down everything that you need to discuss with your healthcare provider and give this information to the admitting nurse. Write down the medications that you will need refilled. Bring your pills and bottles (even the empty ones), to all of your appointments, except for those where a procedure is scheduled. 6. No children or pets: Find someone to take care of them. It is not appropriate to bring them in. 7. Scheduling changes: We request "advanced notification" of any changes or cancellations. 8. Advanced notification: Defined as a time period of more than 24 hours prior to the originally scheduled appointment. This allows for the appointment to be offered to other patients. 9. Rescheduling: When a visit is rescheduled, it will require the  cancellation of the original appointment. For this reason they both fall within the category of "Cancellations".  10. Cancellations: They require advanced notification. Any cancellation less than 24 hours before the  appointment will be recorded as a "No Show". 11. No Show: Defined as an unkept appointment where the patient failed to notify or declare to the practice their intention or inability to keep the appointment.  Corrective process for repeat offenders:  1. Tardiness: Three (3) episodes of rescheduling due to late arrivals will be recorded as one (1) "No Show". 2. Cancellation or reschedule: Three (3) cancellations or rescheduling will be recorded as one (1) "No Show". 3. "No Shows": Three (3) "No Shows" within a 12 month period will result in discharge from the practice.  ____________________________________________________________________________________________  ____________________________________________________________________________________________  Pain Scale  Introduction: The pain score used by this practice is the Verbal Numerical Rating Scale (VNRS-11). This is an 11-point scale. It is for adults and children 10 years or older. There are significant differences in how the pain score is reported, used, and applied. Forget everything you learned in the past and learn this scoring system.  General Information: The scale should reflect your current level of pain. Unless you are specifically asked for the level of your worst pain, or your average pain. If you are asked for one of these two, then it should be understood that it is over the past 24 hours.  Basic Activities of Daily Living (ADL): Personal hygiene, dressing, eating, transferring, and using restroom.  Instructions: Most patients tend to report their level of pain as a combination of two factors, their physical pain and their psychosocial pain. This last one is also known as "suffering" and it is reflection of how  physical  pain affects you socially and psychologically. From now on, report them separately. From this point on, when asked to report your pain level, report only your physical pain. Use the following table for reference.  Pain Clinic Pain Levels (0-5/10)  Pain Level Score  Description  No Pain 0   Mild pain 1 Nagging, annoying, but does not interfere with basic activities of daily living (ADL). Patients are able to eat, bathe, get dressed, toileting (being able to get on and off the toilet and perform personal hygiene functions), transfer (move in and out of bed or a chair without assistance), and maintain continence (able to control bladder and bowel functions). Blood pressure and heart rate are unaffected. A normal heart rate for a healthy adult ranges from 60 to 100 bpm (beats per minute).   Mild to moderate pain 2 Noticeable and distracting. Impossible to hide from other people. More frequent flare-ups. Still possible to adapt and function close to normal. It can be very annoying and may have occasional stronger flare-ups. With discipline, patients may get used to it and adapt.   Moderate pain 3 Interferes significantly with activities of daily living (ADL). It becomes difficult to feed, bathe, get dressed, get on and off the toilet or to perform personal hygiene functions. Difficult to get in and out of bed or a chair without assistance. Very distracting. With effort, it can be ignored when deeply involved in activities.   Moderately severe pain 4 Impossible to ignore for more than a few minutes. With effort, patients may still be able to manage work or participate in some social activities. Very difficult to concentrate. Signs of autonomic nervous system discharge are evident: dilated pupils (mydriasis); mild sweating (diaphoresis); sleep interference. Heart rate becomes elevated (>115 bpm). Diastolic blood pressure (lower number) rises above 100 mmHg. Patients find relief in laying down and not  moving.   Severe pain 5 Intense and extremely unpleasant. Associated with frowning face and frequent crying. Pain overwhelms the senses.  Ability to do any activity or maintain social relationships becomes significantly limited. Conversation becomes difficult. Pacing back and forth is common, as getting into a comfortable position is nearly impossible. Pain wakes you up from deep sleep. Physical signs will be obvious: pupillary dilation; increased sweating; goosebumps; brisk reflexes; cold, clammy hands and feet; nausea, vomiting or dry heaves; loss of appetite; significant sleep disturbance with inability to fall asleep or to remain asleep. When persistent, significant weight loss is observed due to the complete loss of appetite and sleep deprivation.  Blood pressure and heart rate becomes significantly elevated. Caution: If elevated blood pressure triggers a pounding headache associated with blurred vision, then the patient should immediately seek attention at an urgent or emergency care unit, as these may be signs of an impending stroke.    Emergency Department Pain Levels (6-10/10)  Emergency Room Pain 6 Severely limiting. Requires emergency care and should not be seen or managed at an outpatient pain management facility. Communication becomes difficult and requires great effort. Assistance to reach the emergency department may be required. Facial flushing and profuse sweating along with potentially dangerous increases in heart rate and blood pressure will be evident.   Distressing pain 7 Self-care is very difficult. Assistance is required to transport, or use restroom. Assistance to reach the emergency department will be required. Tasks requiring coordination, such as bathing and getting dressed become very difficult.   Disabling pain 8 Self-care is no longer possible. At this level, pain is disabling. The individual  is unable to do even the most "basic" activities such as walking, eating, bathing,  dressing, transferring to a bed, or toileting. Fine motor skills are lost. It is difficult to think clearly.   Incapacitating pain 9 Pain becomes incapacitating. Thought processing is no longer possible. Difficult to remember your own name. Control of movement and coordination are lost.   The worst pain imaginable 10 At this level, most patients pass out from pain. When this level is reached, collapse of the autonomic nervous system occurs, leading to a sudden drop in blood pressure and heart rate. This in turn results in a temporary and dramatic drop in blood flow to the brain, leading to a loss of consciousness. Fainting is one of the body's self defense mechanisms. Passing out puts the brain in a calmed state and causes it to shut down for a while, in order to begin the healing process.    Summary: 1. Refer to this scale when providing [place] with your pain level. 2. Be accurate and careful when reporting your pain level. This will help with your care. 3. Over-reporting your pain level will lead to loss of credibility. 4. Even a level of 1/10 means that there is pain and will be treated at our facility. 5. High, inaccurate reporting will be documented as "Symptom Exaggeration", leading to loss of credibility and suspicions of possible secondary gains such as obtaining more narcotics, or wanting to appear disabled, for fraudulent reasons. 6. Only pain levels of 5 or below will be seen at our facility. 7. Pain levels of 6 and above will be sent to the Emergency Department and the appointment cancelled.  Please get your x-rays done as soon as possible. ____________________________________________________________________________________________

## 2016-11-07 NOTE — Progress Notes (Signed)
Safety precautions to be maintained throughout the outpatient stay will include: orient to surroundings, keep bed in low position, maintain call bell within reach at all times, provide assistance with transfer out of bed and ambulation.  

## 2016-11-11 ENCOUNTER — Telehealth (INDEPENDENT_AMBULATORY_CARE_PROVIDER_SITE_OTHER): Payer: Self-pay | Admitting: Orthopaedic Surgery

## 2016-11-11 DIAGNOSIS — M47816 Spondylosis without myelopathy or radiculopathy, lumbar region: Secondary | ICD-10-CM | POA: Insufficient documentation

## 2016-11-11 DIAGNOSIS — M5030 Other cervical disc degeneration, unspecified cervical region: Secondary | ICD-10-CM | POA: Insufficient documentation

## 2016-11-11 MED ORDER — HYDROCODONE-ACETAMINOPHEN 5-325 MG PO TABS: 1.0000 | ORAL_TABLET | Freq: Two times a day (BID) | ORAL | 0 refills | Status: DC | PRN

## 2016-11-11 NOTE — Telephone Encounter (Signed)
Script entered I called daughter and advised script at front and could be picked up after 8am in the morning.

## 2016-11-11 NOTE — Telephone Encounter (Signed)
Norco #[phone]

## 2016-11-11 NOTE — Telephone Encounter (Signed)
Please advise 

## 2016-11-11 NOTE — Telephone Encounter (Signed)
Patient's [name]) called advised her mother does not have any pain medicine and she will not be scheduled for pain management for another two weeks. [name] said her mother has been crying and screaming in pain with her hip and knee. [name] asked if her mother can get something for pain. The number to contact [name] is [phone]

## 2016-11-12 ENCOUNTER — Ambulatory Visit (INDEPENDENT_AMBULATORY_CARE_PROVIDER_SITE_OTHER): Payer: Medicare Other | Admitting: Orthopaedic Surgery

## 2016-11-14 LAB — COMPLIANCE DRUG ANALYSIS, UR

## 2016-11-19 ENCOUNTER — Ambulatory Visit
Admission: RE | Admit: 2016-11-19 | Discharge: 2016-11-19 | Disposition: A | Discharge status: Home / Self Care | Payer: Medicare Other | Source: Ambulatory Visit | Attending: Nurse Practitioner | Admitting: Nurse Practitioner

## 2016-11-19 ENCOUNTER — Other Ambulatory Visit: Payer: Self-pay | Admitting: Nurse Practitioner

## 2016-11-19 ENCOUNTER — Other Ambulatory Visit
Admission: RE | Admit: 2016-11-19 | Discharge: 2016-11-19 | Disposition: A | Discharge status: Home / Self Care | Payer: Medicare Other | Source: Ambulatory Visit | Attending: Pain Medicine | Admitting: Pain Medicine

## 2016-11-19 ENCOUNTER — Ambulatory Visit
Admission: RE | Admit: 2016-11-19 | Discharge: 2016-11-19 | Disposition: A | Discharge status: Home / Self Care | Payer: Medicare Other | Source: Ambulatory Visit | Attending: Pain Medicine | Admitting: Pain Medicine

## 2016-11-19 DIAGNOSIS — M545 Low back pain: Principal | ICD-10-CM

## 2016-11-19 DIAGNOSIS — G8929 Other chronic pain: Secondary | ICD-10-CM

## 2016-11-19 DIAGNOSIS — W19XXXA Unspecified fall, initial encounter: Secondary | ICD-10-CM | POA: Diagnosis not present

## 2016-11-19 DIAGNOSIS — G894 Chronic pain syndrome: Secondary | ICD-10-CM

## 2016-11-19 DIAGNOSIS — R0781 Pleurodynia: Secondary | ICD-10-CM

## 2016-11-19 DIAGNOSIS — M25561 Pain in right knee: Secondary | ICD-10-CM | POA: Insufficient documentation

## 2016-11-19 DIAGNOSIS — Z79891 Long term (current) use of opiate analgesic: Secondary | ICD-10-CM

## 2016-11-19 DIAGNOSIS — M25551 Pain in right hip: Secondary | ICD-10-CM

## 2016-11-19 LAB — COMPREHENSIVE METABOLIC PANEL
ALT: 12 U/L — ABNORMAL LOW (ref 14–54)
AST: 25 U/L (ref 15–41)
Albumin: 3.9 g/dL (ref 3.5–5.0)
Alkaline Phosphatase: 82 U/L (ref 38–126)
Anion gap: 7 (ref 5–15)
BUN: 8 mg/dL (ref 6–20)
CO2: 28 mmol/L (ref 22–32)
Calcium: 9 mg/dL (ref 8.9–10.3)
Chloride: 105 mmol/L (ref 101–111)
Creatinine, Ser: 0.72 mg/dL (ref 0.44–1.00)
GFR calc Af Amer: >60 mL/min (ref >60)
GFR calc non Af Amer: >60 mL/min (ref >60)
Glucose, Bld: 92 mg/dL (ref 65–99)
Potassium: 3.8 mmol/L (ref 3.5–5.1)
Sodium: 140 mmol/L (ref 135–145)
Total Bilirubin: 0.4 mg/dL (ref 0.3–1.2)
Total Protein: 8.1 g/dL (ref 6.5–8.1)

## 2016-11-19 LAB — MAGNESIUM: Magnesium: 2 mg/dL (ref 1.7–2.4)

## 2016-11-19 LAB — SEDIMENTATION RATE: Sed Rate: 20 mm/hr (ref 0–30)

## 2016-11-19 IMAGING — CT CT CERVICAL SPINE W/O CM
3 of 6 series · 10 of 35 positions shown, 12 images · non-contrast
Comparison: CT scan of the head dated 10/25/2014 and CT scan of the
cervical spine dated 01/26/2015

CLINICAL DATA: Head and neck pain after falling out of bed onto a
hardwood floor.

EXAM:
CT HEAD WITHOUT CONTRAST
CT CERVICAL SPINE WITHOUT CONTRAST
TECHNIQUE: Multidetector CT imaging of the head and cervical spine was
performed following the standard protocol without intravenous
contrast. Multiplanar CT image reconstructions of the cervical spine
were also generated.

[Series 304: coronal upper · coronal · 0.40mm/px · 3 of 35 slices shown]
[im 7/35  bone]
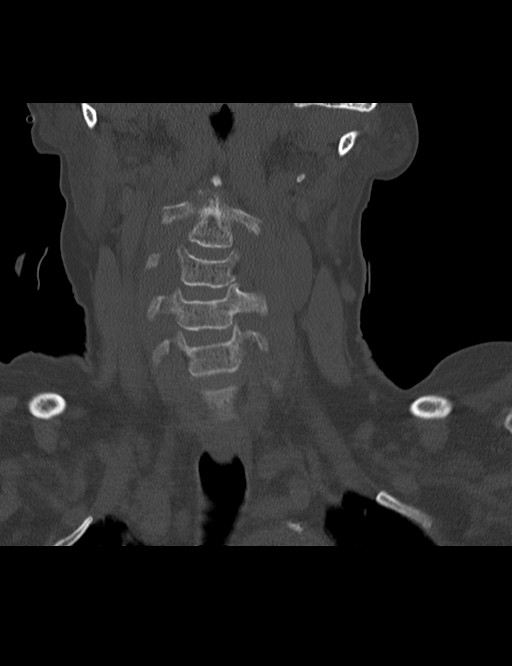
[im 14/35  bone]
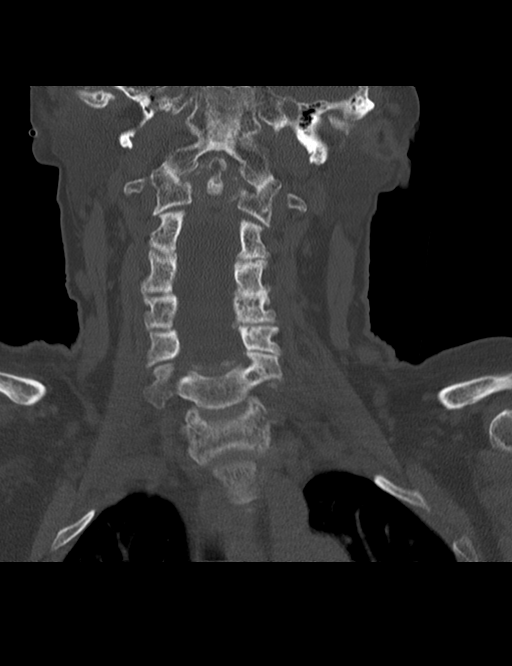
[im 21/35  bone]
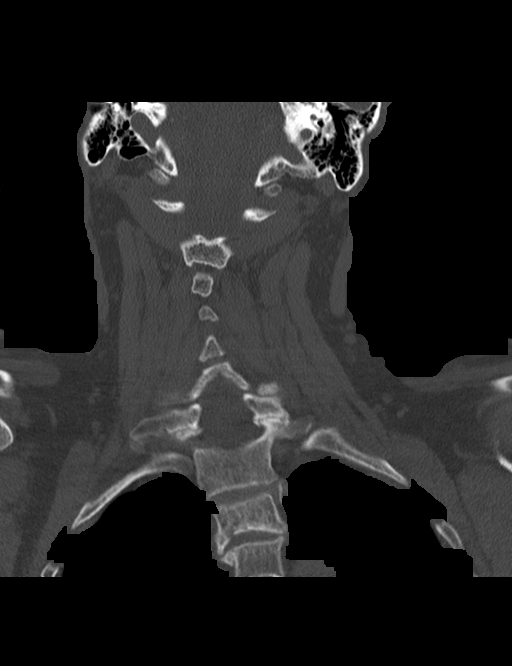

[Series 306: orthogonals · axial · 0.40mm/px · z∈[+122,+172]mm · 2 of 82 slices shown, 3 images]
[im 28/82  soft-tissue]
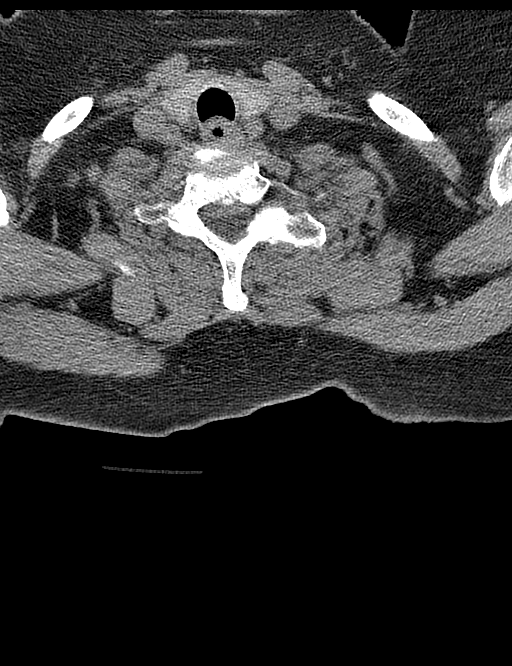
[im 28/82  bone]
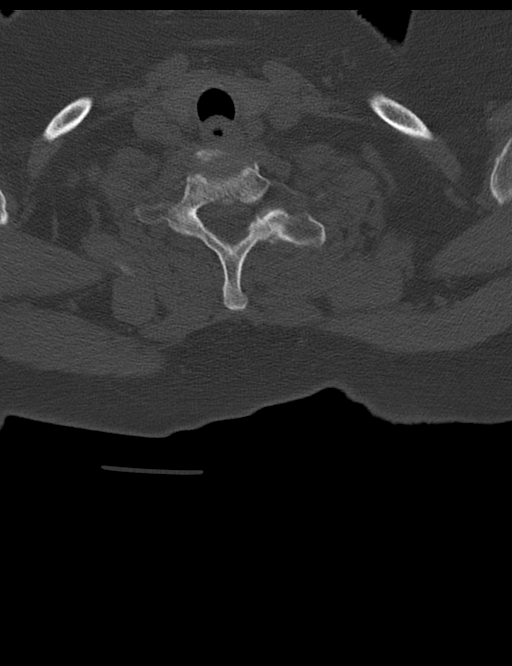
[im 55/82  bone]
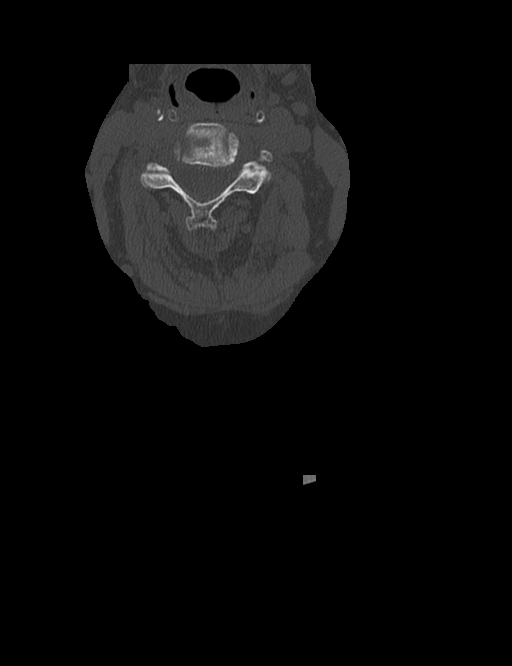

[Series 308: sag · sagittal · 0.40mm/px · 5 of 52 slices shown, 6 images]
[im 18/52  bone]
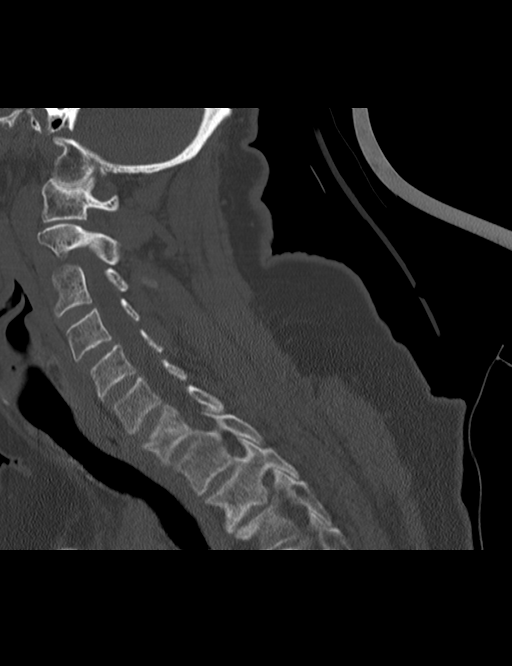
[im 22/52  bone]
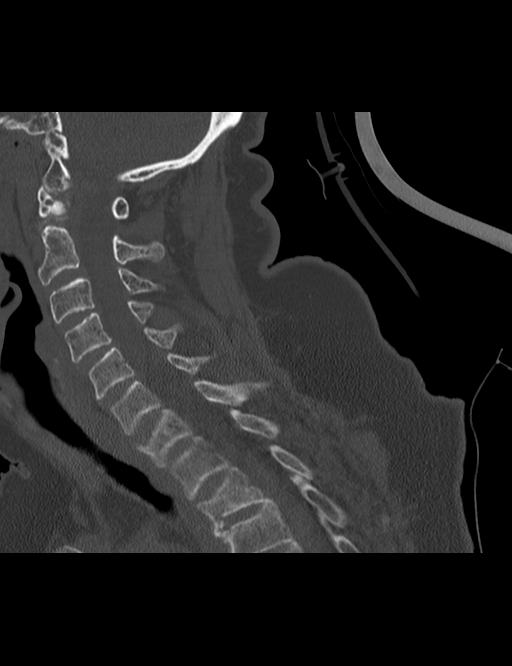
[im 26/52  soft-tissue]
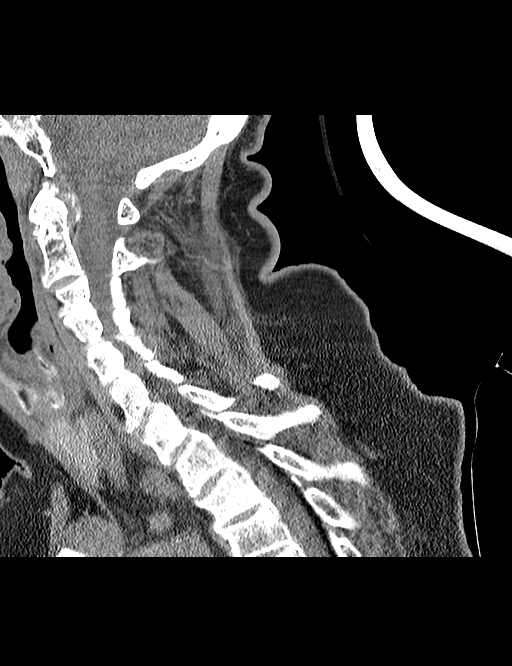
[im 26/52  bone]
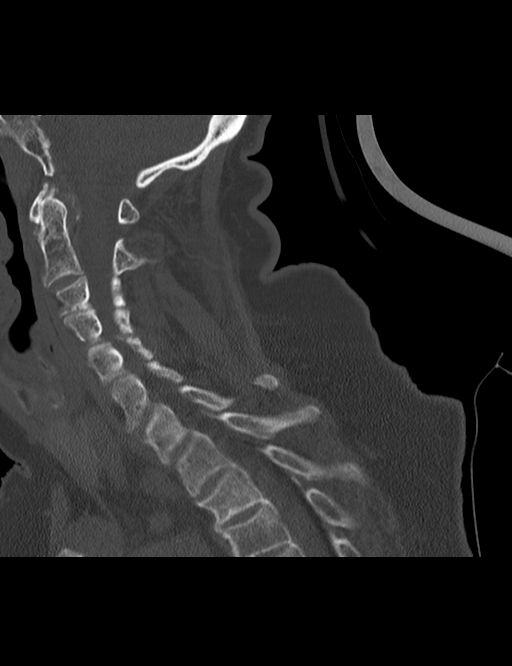
[im 30/52  bone]
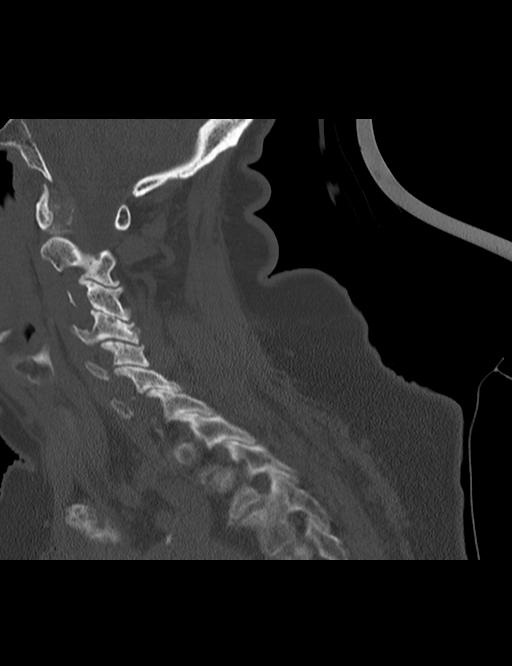
[im 35/52  bone]
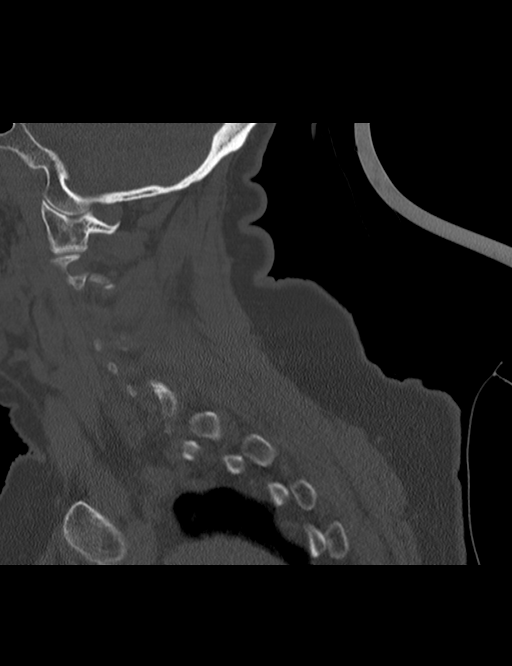

[10 of 35 positions shown; findings below may reference images not displayed]

FINDINGS: CT HEAD FINDINGS

No mass lesion. No midline shift. No acute hemorrhage or hematoma.
No extra-axial fluid collections. No evidence of acute infarction.
There is diffuse slight cerebral cortical atrophy with secondary
ventricular dilatation. Brain parenchyma is otherwise normal. No
acute osseous abnormalities.

CT CERVICAL SPINE FINDINGS

There are old compression fractures of C7 and of T2 and of the
superior aspect of T3. There is no acute fracture. No subluxation or
prevertebral soft tissue swelling. Multilevel degenerative facet
arthritis from C2-3 through C6-7 on the left. Disc space narrowing
at T to 3, chronic.

No visible disc protrusions.
IMPRESSION: 1. No acute intracranial abnormality.  Diffuse slight atrophy.
2. No acute abnormality of the cervical spine. Old fractures of C7
and T2 and T3.

## 2016-11-19 IMAGING — CT CT T SPINE W/O CM
1 of 12 series · 2 of 33 positions shown, 3 images · non-contrast
Comparison: CT thoracic spine 01/26/2015, lumbar spine radiographs
09/22/2014, CT abdomen and pelvis 10/17/2012

CLINICAL DATA: Fell out of bed onto hardwood floors, head neck
pain, recent upper thoracic fracture, new fall

EXAM:
CT THORACIC AND LUMBAR SPINE WITHOUT CONTRAST
TECHNIQUE: Multidetector CT imaging of the thoracic and lumbar spine was
performed without contrast. Multiplanar CT image reconstructions
were also generated.

[Series 206: orthogonal mid · axial · 0.41mm/px · z∈[+373,+532]mm · 2 of 81 slices shown, 3 images]
[im 1/81  soft-tissue]
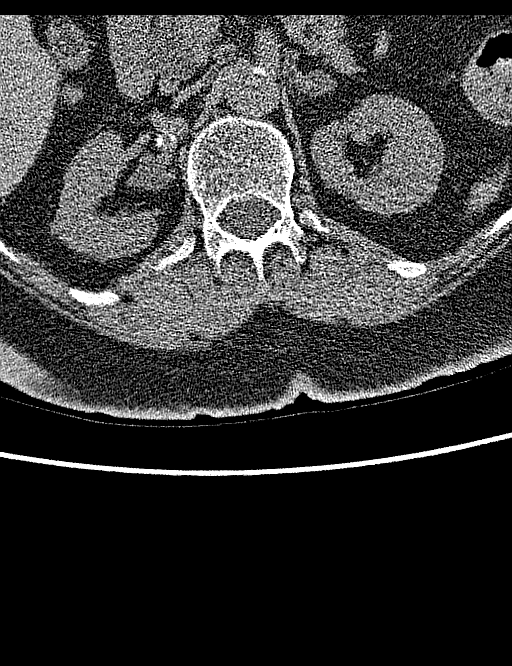
[im 1/81  bone]
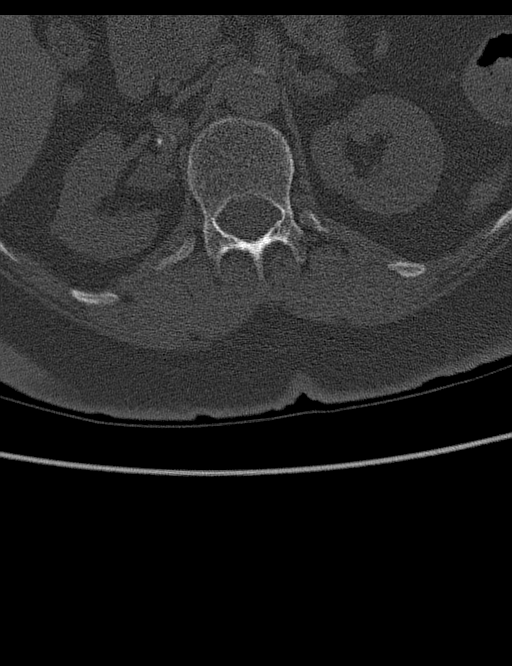
[im 81/81  bone]
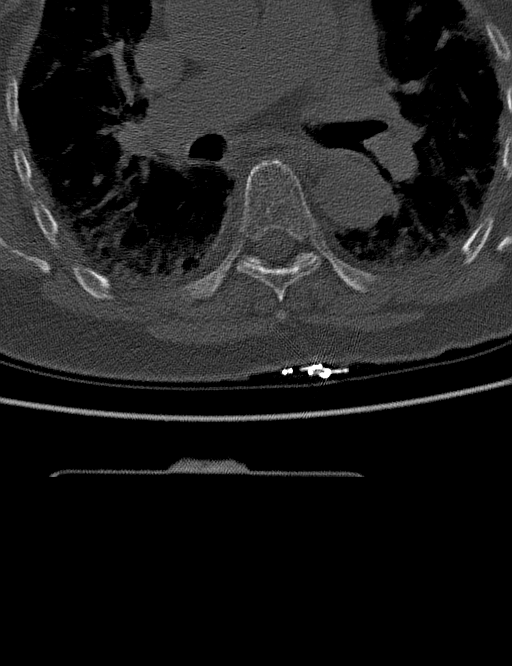

[2 of 33 positions shown; findings below may reference images not displayed]

FINDINGS: CT THORACIC SPINE FINDINGS

Diffuse osseous demineralization.

Scattered atherosclerotic calcifications with ascending thoracic
aorta 3.9 cm transverse image 42.

Additional atherosclerotic calcifications at coronary arteries and
proximal great vessels.

No thoracic adenopathy.

Calcified granuloma LEFT upper lobe image 19.

Anterior compression fracture of T2 unchanged, involving both
superior and inferior endplates.

Superior endplate compression fracture T3 unchanged.

Minimal concavity superior endplate T4 stable.

No new fracture, subluxation or bone destruction.

Visualized portions of ribs and scapulae unremarkable.

CT LUMBAR SPINE FINDINGS

Small calcified renal artery aneurysms bilaterally 10 mm RIGHT hand
8 mm LEFT.

Atherosclerotic calcifications aorta.

LEFT adrenal nodule 14 x 10 mm stable since 10/17/2012.

Based on numbering of ribs and thoracic spine, only 4 lumbar type
vertebral bodies are identified.

No lumbar fracture or subluxation.

Mild scattered degenerative disc disease changes.

Inferior endplate concavities at L2, L3 and L4 are stable from prior
radiographs.

SI joints symmetric.

Diffusely bulging discs at L1-L2, L2-L3, and L3-L4.

Significant multifactorial spinal stenosis at L2-L3.
IMPRESSION: CT THORACIC SPINE IMPRESSION

Stable compression fractures at T2 and T3.

Stable superior endplate concavity T4.

Osseous demineralization with scattered degenerative disc disease
changes.

No acute abnormalities.

Borderline aneurysmal dilatation ascending thoracic aorta 3.9 cm
diameter.

Recommend annual imaging followup by CTA or MRA. This recommendation
follows 1202 ACCF/AHA/AATS/ACR/ASA/SCA/KAKI/GIORGI/ENOCINT/SCHUMANN Guidelines
for the Diagnosis and Management of Patients with Thoracic Aortic
Disease. Circulation.1202; 121: [id number]

CT LUMBAR SPINE IMPRESSION

No acute lumbar spine abnormalities.

Stable inferior endplate concavities at L2, L3, and L4.

Bulging discs at multiple levels with significant multifactorial
spinal stenosis likely present at L2-L3.

Small BILATERAL calcified renal artery aneurysms.

## 2016-11-20 LAB — VITAMIN B12: Vitamin B-12: 1888 pg/mL — ABNORMAL HIGH (ref 180–914)

## 2016-11-20 LAB — C-REACTIVE PROTEIN: CRP: <0.8 mg/dL (ref <1.0)

## 2016-11-20 NOTE — Progress Notes (Signed)
Results were reviewed and found to be: mildly abnormal  No acute injury or pathology identified  Review would suggest interventional pain management techniques may be of benefit 

## 2016-11-20 NOTE — Progress Notes (Signed)
Results were reviewed and found to be: abnormal  No new injury  Further testing maybe useful  Review would suggest the patient to be a possible candidate for interventional pain management options

## 2016-11-21 ENCOUNTER — Ambulatory Visit (INDEPENDENT_AMBULATORY_CARE_PROVIDER_SITE_OTHER): Payer: Medicare Other | Admitting: Orthopaedic Surgery

## 2016-11-22 ENCOUNTER — Ambulatory Visit (INDEPENDENT_AMBULATORY_CARE_PROVIDER_SITE_OTHER): Payer: Medicare Other | Admitting: Orthopaedic Surgery

## 2016-11-23 LAB — 25-HYDROXY VITAMIN D LCMS D2+D3
25-Hydroxy, Vitamin D-2: <1 ng/mL
25-Hydroxy, Vitamin D-3: 37 ng/mL
25-Hydroxy, Vitamin D: 37 ng/mL

## 2016-11-24 IMAGING — CR DG CHEST 2V
2 series · 2 of 2 positions shown · non-contrast
Comparison: 10/25/2014

CLINICAL DATA: Fall with neck and back pain.  Initial encounter.

EXAM:
CHEST  2 VIEW

[chest lat]
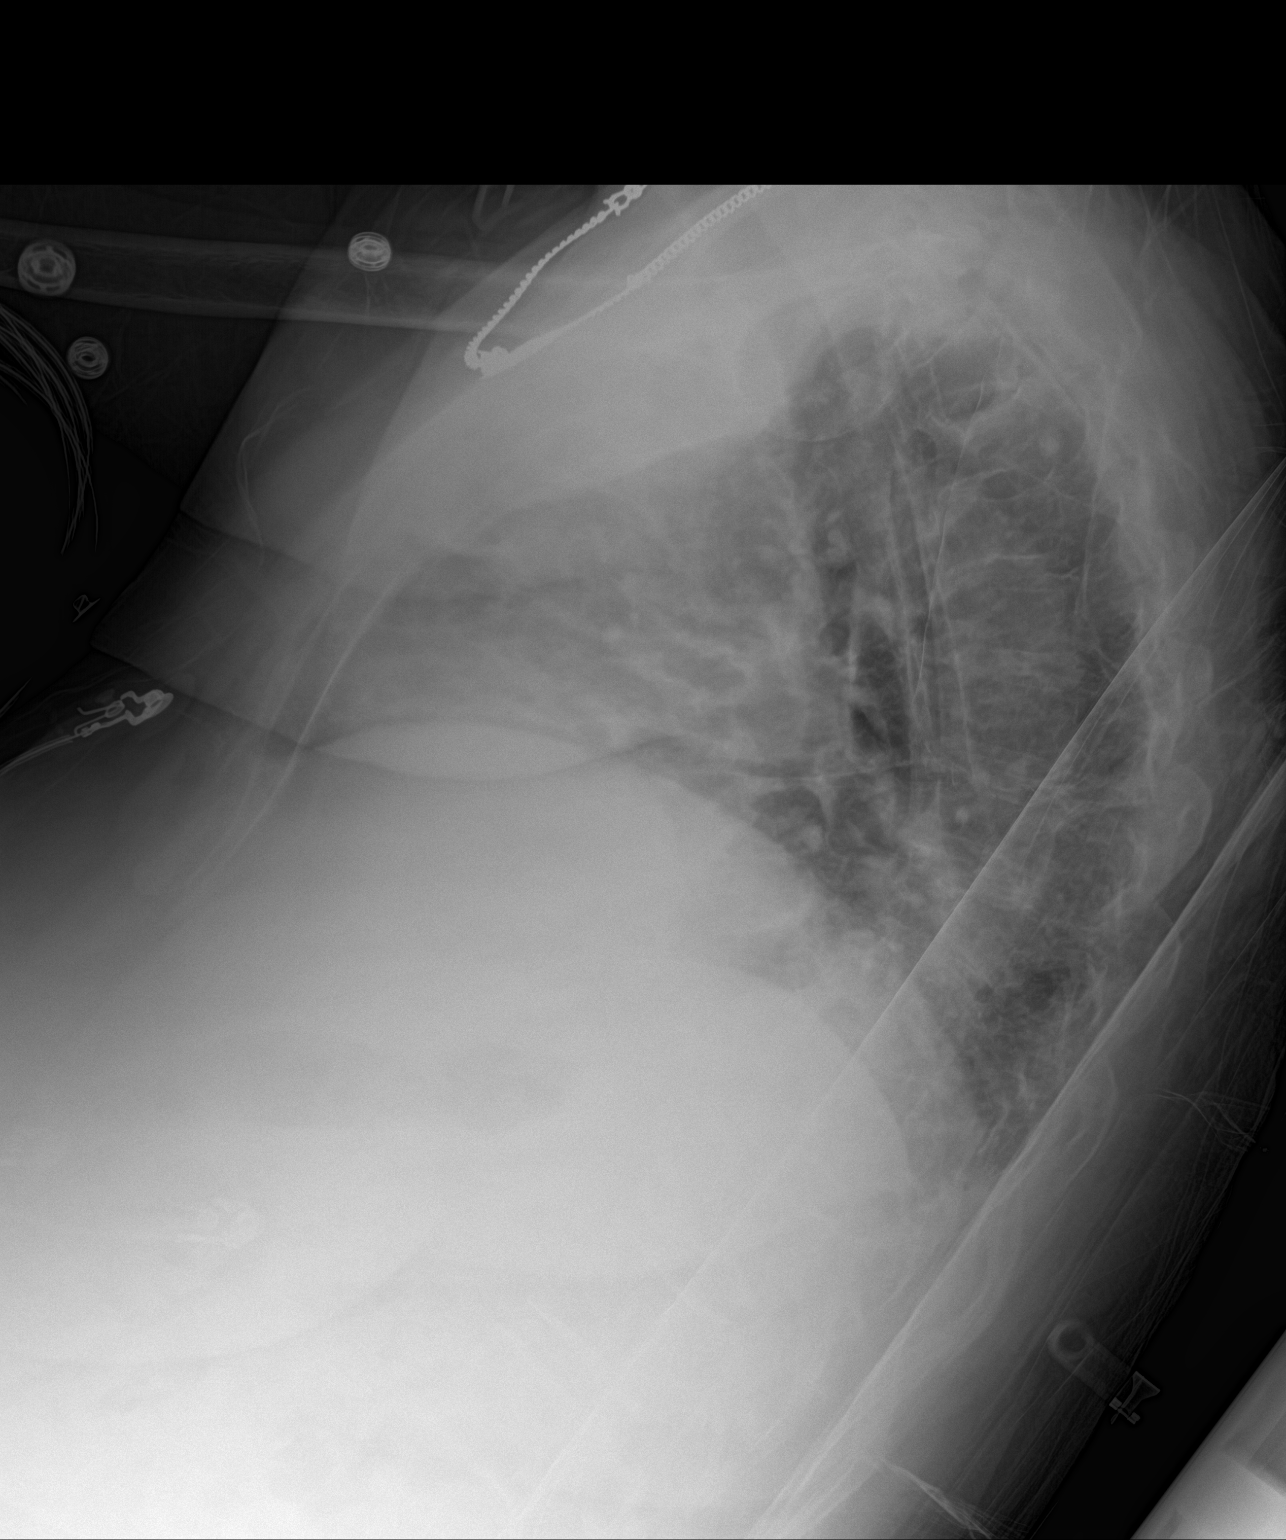

[chest ap]
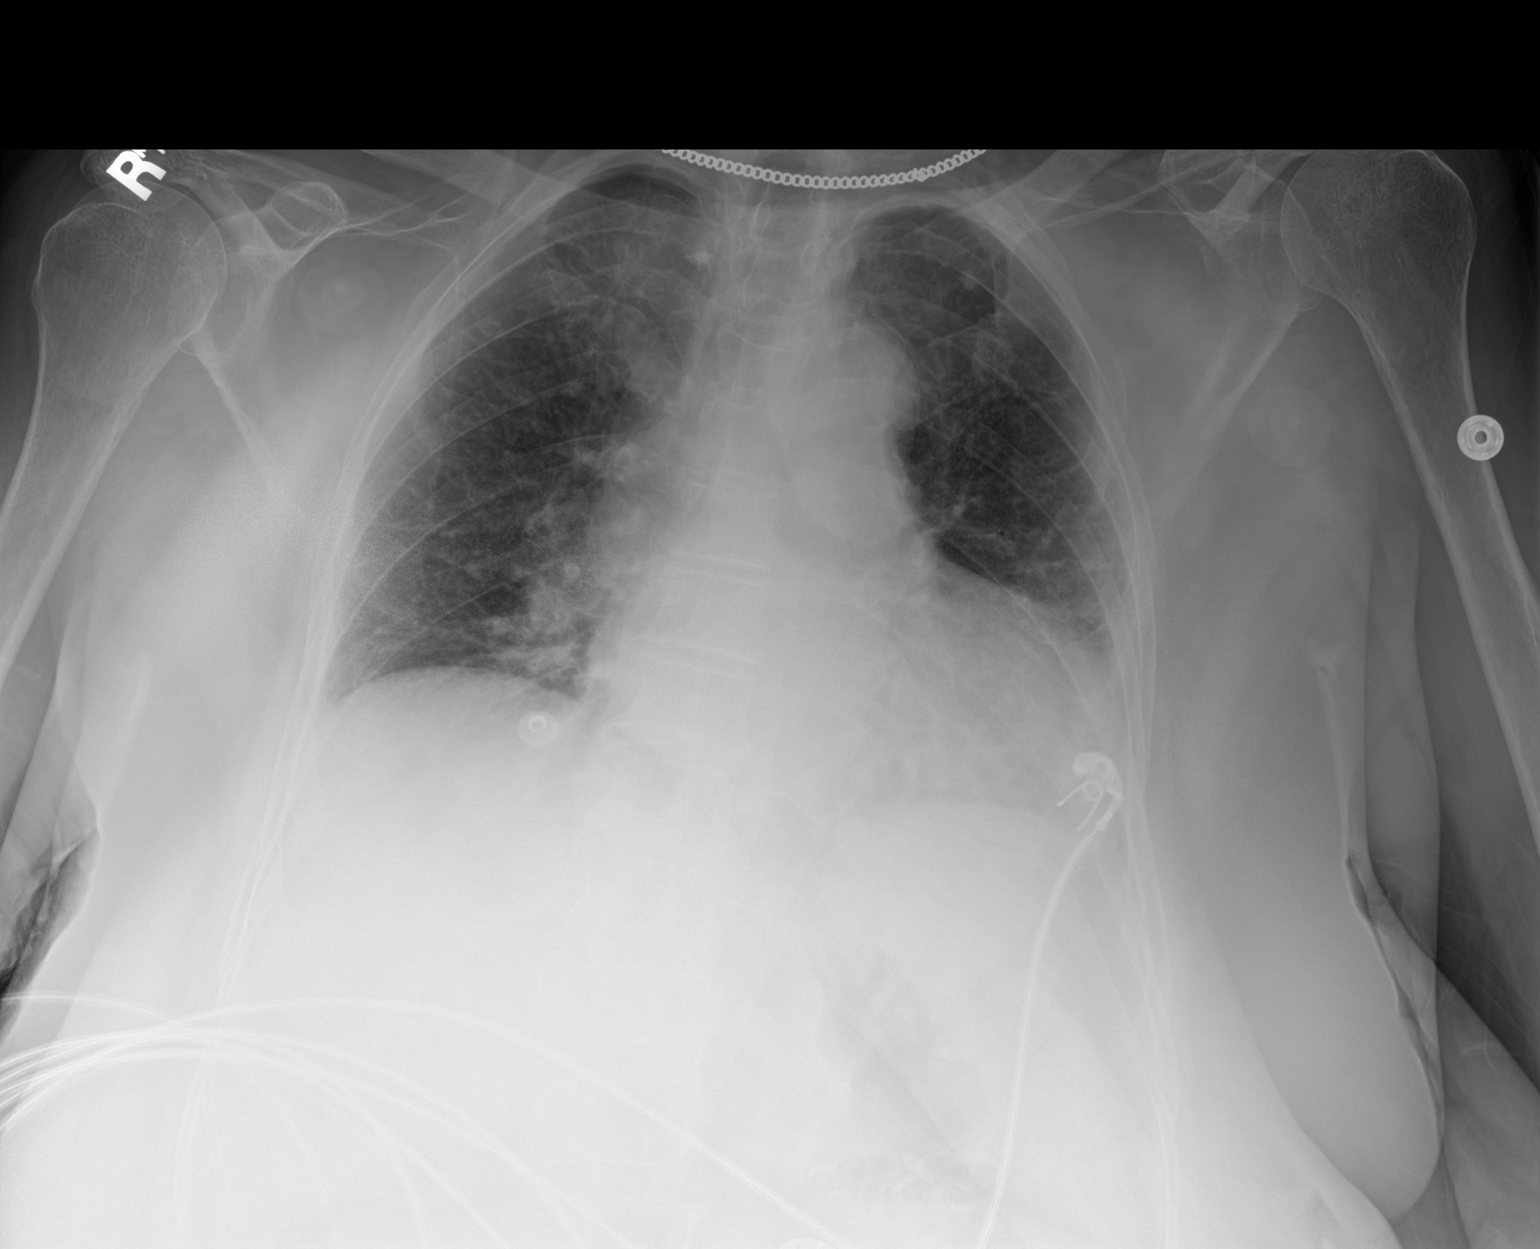

[2 of 2 positions shown; findings below may reference images not displayed]

FINDINGS: Chronic cardiomegaly and aortic tortuosity.

Hypoventilation with interstitial crowding and coarsening, stable.
There is no edema, consolidation, effusion, or pneumothorax. No
visible fracture.
IMPRESSION: Stable exam.  No evidence of acute cardiopulmonary disease.

## 2016-11-26 ENCOUNTER — Telehealth (INDEPENDENT_AMBULATORY_CARE_PROVIDER_SITE_OTHER): Payer: Self-pay | Admitting: *Deleted

## 2016-11-26 NOTE — Telephone Encounter (Signed)
I called and notified [name] that per Dr. Erlinda Hong, he is denying her rx for pain meds until she comes in for OV, per [name] she has to go with her sister to cancer center on Thursday so we r/s her appt to Friday at 72 w/ Dr. Erlinda Hong

## 2016-11-26 NOTE — Telephone Encounter (Signed)
Pt daughter calling stating pt is in a lot of pain and she feels her mothers hip should be healed by now. Pt asked for pain medication. Also made pt an appt.

## 2016-11-28 ENCOUNTER — Ambulatory Visit (INDEPENDENT_AMBULATORY_CARE_PROVIDER_SITE_OTHER): Payer: Medicare Other | Admitting: Orthopaedic Surgery

## 2016-11-29 ENCOUNTER — Ambulatory Visit (INDEPENDENT_AMBULATORY_CARE_PROVIDER_SITE_OTHER): Payer: Medicare Other | Admitting: Orthopaedic Surgery

## 2016-11-29 DIAGNOSIS — S72141A Displaced intertrochanteric fracture of right femur, initial encounter for closed fracture: Secondary | ICD-10-CM

## 2016-11-29 MED ORDER — BACLOFEN 10 MG PO TABS: 10.0000 mg | ORAL_TABLET | Freq: Three times a day (TID) | ORAL | 0 refills | Status: DC

## 2016-11-29 MED ORDER — HYDROCODONE-ACETAMINOPHEN 7.5-325 MG PO TABS: 1.0000 | ORAL_TABLET | Freq: Two times a day (BID) | ORAL | 0 refills | Status: DC | PRN

## 2016-11-29 NOTE — Progress Notes (Signed)
Office Visit Note   Patient: [name]           Date of Birth: 02/10/1941           MRN: [id number] Visit Date: 11/29/2016              Requested by: Wenda Low, MD [address] [place], [place] [address] PCP: Wenda Low, MD   Assessment & Plan: Visit Diagnoses:  1. Displaced intertrochanteric fracture of right femur, initial encounter for closed fracture [facility])     Plan: I think [name]'s pain is more anxiety and super tentorial related. However I want to get a CT scan just to be absolutely sure that her fracture is healing. I watched her stand up from a wheelchair without any difficulty or apparent signs of pain but then suddenly she yells out in pain and drops down into her wheelchair. This does not appear organic in nature. I think she has very poor pain tolerance and she is been on chronic narcotic pain medicines. I will call the patient's with the CT scan results.  Follow-Up Instructions: Return in about 6 weeks (around 01/10/2017).   Orders:  No orders of the defined types were placed in this encounter.  Meds ordered this encounter  Medications  . HYDROcodone-acetaminophen (NORCO) 7.5-325 MG tablet    Sig: Take 1-2 tablets by mouth 2 (two) times daily as needed for moderate pain.    Dispense:  40 tablet    Refill:  0  . baclofen (LIORESAL) 10 MG tablet    Sig: Take 1 tablet (10 mg total) by mouth 3 (three) times daily.    Dispense:  30 tablet    Refill:  0      Procedures: No procedures performed   Clinical Data: No additional findings.   Subjective: No chief complaint on file.   [name] is a 77 days status post intramedullary nailing of the right intertrochanter fracture. She comes in today for continued pain in her right hip. She states that she is having trouble walking. She is currently on a skilled nursing facility.    Review of Systems   Objective: Vital Signs: There were no vitals taken for this visit.  Physical  Exam  Ortho Exam Right hip exam demonstrates pain out of proportion. Surgical scars are fully healed. I do not see any swelling or evidence of infection or worrisome features. Range of motion of the hip is actually decent and without significant pain. Specialty Comments:  No specialty comments available.  Imaging: No results found.   PMFS History: Patient Active Problem List   Diagnosis Date Noted  . Degenerative disc disease, cervical 11/11/2016  . Lumbar facet arthropathy ([place]) 11/11/2016  . 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 CAFL (chronic airflow limitation) ([place]) 11/07/2016  . Disc disorder 11/07/2016  . Depression, major, in partial remission ([place]) 11/07/2016  . 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 Bed wetting 11/07/2016  . Long term current use of opiate analgesic 11/07/2016  . Long term prescription opiate use 11/07/2016  . Chronic pain syndrome 11/07/2016  . Neck pain (secondary) (left > right) 11/07/2016  . Hip pain 11/07/2016  . Rib pain 11/07/2016  . Age-related osteoporosis with current pathological fracture with routine healing 09/20/2016  . History of stroke 09/20/2016  . Major depression in remission ([place]) 09/20/2016  . Hip fracture ([place]) 09/13/2016  . Displaced intertrochanteric fracture of right femur, initial encounter for closed fracture (  [place]) 09/13/2016  . Fall   . Chronic pain of both shoulders 08/12/2016  . Status post total left knee replacement 04/04/2016  . Chronic left shoulder pain 02/28/2016  . Hoarseness 01/12/2016  . Pharyngoesophageal dysphagia 01/12/2016  . Low back pain (teritary) (Right > Left) 10/26/2015  . Status post revision of total replacement of right knee 10/26/2015  . Spinal stenosis in cervical region 06/27/2015  . UTI (urinary tract infection) 06/25/2015  . Syncope, non cardiac 05/22/2015  . Diffuse myofascial pain syndrome 03/24/2015  . Sepsis ([place]) 10/08/2014  .  Bacteremia 10/07/2014  . Leukopenia 10/07/2014  . Thrombocytopenia ([place]) 10/07/2014  . Fever 10/05/2014  . Hypoxia 10/05/2014  . Anxiety 06/15/2014  . Apoplectic vertigo 06/15/2014  . Duodenogastric reflux 06/15/2014  . Benign essential HTN 06/15/2014  . Infection of the upper respiratory tract 06/15/2014  . Hypercholesteremia 07/30/2013  . HTN (hypertension) 07/30/2013  . GERD (gastroesophageal reflux disease) 07/30/2013  . Chest pain 07/29/2013  . Dizziness and giddiness 06/18/2013  . Gait difficulty 05/03/2013  . Memory loss 05/03/2013  . Knee pain (primary) (right) 09/10/2012  . Wrist pain, acute 09/10/2012   Past Medical History:  Diagnosis Date  . Anemia   . B12 deficiency   . Bladder incontinence   . Blind left eye   . Cataract   . Cervical spine fracture ([place])   . Compression fracture    C7,  upper T spine -compression fx  . COPD (chronic obstructive pulmonary disease) ([place])   . DDD (degenerative disc disease), lumbar   . Depression    major  . GERD (gastroesophageal reflux disease)   . Hiatal hernia   . Hyperlipidemia   . Hypertension   . 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 Macular degeneration   . Osteoarthritis   . Osteoporosis   . Reactive airway disease   . Rupture of bowel ([place])   . Stroke [facility])    "mini stroke"  . Stroke [facility])     Family History  Problem Relation Age of Onset  . Heart failure Mother   . Heart attack Father     Past Surgical History:  Procedure Laterality Date  . ABDOMINAL HYSTERECTOMY    . ABDOMINAL SURGERY    . APPENDECTOMY    . BLADDER SURGERY    . CHOLECYSTECTOMY    . COLON SURGERY    . EYE SURGERY     cataracts  . INTRAMEDULLARY (IM) NAIL INTERTROCHANTERIC Right 09/13/2016   Procedure: INTRAMEDULLARY (IM) NAIL INTERTROCHANTRIC RIGHT;  Surgeon: Leandrew Koyanagi, MD;  Location: [facility];  Service: Orthopedics;  Laterality: Right;  . JOINT REPLACEMENT    . KNEE SURGERY    . OSTOMY    . RECTOCELE REPAIR      Social History   Occupational History  . retired     n/a   Social History Main Topics  . Smoking status: Former Smoker    Packs/day: 1.50    Years: 30.00  . Smokeless tobacco: Never Used     Comment: quit 2013  . Alcohol use No  . Drug use: No  . Sexual activity: No

## 2016-12-02 ENCOUNTER — Other Ambulatory Visit (INDEPENDENT_AMBULATORY_CARE_PROVIDER_SITE_OTHER): Payer: Self-pay

## 2016-12-02 DIAGNOSIS — M25551 Pain in right hip: Secondary | ICD-10-CM

## 2016-12-04 ENCOUNTER — Ambulatory Visit (INDEPENDENT_AMBULATORY_CARE_PROVIDER_SITE_OTHER): Payer: Medicare Other | Admitting: Diagnostic Neuroimaging

## 2016-12-04 ENCOUNTER — Encounter: Payer: Self-pay | Admitting: Diagnostic Neuroimaging

## 2016-12-04 VITALS — BP 130/86 | HR 104 | Ht 65.0 in | Wt 178.6 lb

## 2016-12-04 DIAGNOSIS — R251 Tremor, unspecified: Secondary | ICD-10-CM | POA: Diagnosis not present

## 2016-12-04 DIAGNOSIS — R269 Unspecified abnormalities of gait and mobility: Secondary | ICD-10-CM | POA: Diagnosis not present

## 2016-12-04 DIAGNOSIS — R42 Dizziness and giddiness: Secondary | ICD-10-CM | POA: Diagnosis not present

## 2016-12-04 DIAGNOSIS — R413 Other amnesia: Secondary | ICD-10-CM

## 2016-12-04 DIAGNOSIS — M62838 Other muscle spasm: Secondary | ICD-10-CM | POA: Diagnosis not present

## 2016-12-04 NOTE — Progress Notes (Signed)
[facility]  PATIENT: [name] DOB: 06/01/1940  REFERRING CLINICIAN:  HISTORY FROM: patient, daughter REASON FOR VISIT: follow up   HISTORICAL  CHIEF COMPLAINT:  Chief Complaint  Patient presents with  . NX  . jerking, recent falls.    last seen for dizziness, gait,   Jerking for last week/ Falls R knee/leg gives out.   R hip fractured (surgery) Dr Marinus [facility]aw [facility].     HISTORY OF PRESENT ILLNESS:   UPDATE 12/04/16: Since last visit, fell down in April 2018, with right hip fracture, s/p ORIF, discharged to rehab. Now with more tremors, muscle jerks, memory loss. Continues with right leg pain.   UPDATE 08/15/15: Since last visit, dizziness is better. Still working with pain mgmt, but planning to switch to new clinic. Having numbness in hands. Having more headaches, pressure, scalp sensitive, no nausea, vomiting, photophobia, phonophobia.   UPDATE 02/21/15: Since last visit, continues to have balance diff and falls. Continues to have memory loss, headaches, neck pain, tremors. Has had multiple ER visits.   UPDATE 12/06/14 (VRP): Since last visit, had more syncope events, falls, now with compression fractures in spine (C7, T2, T3). Now with more neck pain, right ear pain. Seeing Dr. Saintclair Halsted, and has conservative mgmt with c-collar. Had an event in the office where she briefly slumped, and rapidly awoke. Vitals stable.   UPDATE 06/13/14 (VRP): Since last visit, still with stress, poor PO intake, another syncope event around christmas 2015 (didn't go to hospital), memory loss, staying cold all the time.   UPDATE 10/19/13 (LL): Since last visit, Home Health PT for gait and balance training; vestibular rehab was ordered and completed. TSH was normal. 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 No acute findings. No change from MRI on 03/30/12. Basically her symptoms have not changed. Daughter lives with her and  helps with daily ADLs. Also helps her do vestibular exercises.  UPDATE 06/18/13 (LL): Patient returns for follow up. Daughter requests sooner revisit, patient's dizziness has not improved. She did not get labs drawn that were ordered at last visit (B12, TSH, and HgbA1C). She states her PCP checks her sugar and B12. Her dizziness is mainly brought on by quick changes in position. She states she has tried vestibular rehab exercises before and it did not help. She denies falls. She has tried Meclizine in the past with no benefit.   UPDATE 05/03/13 (VRP): Patient presents as referral for recurrent dizziness, balance difficulty, passing out. Patient tells me that she is feeling swimmy headed, having spinning sensation with vertigo, lasting a few minutes at a time. This typically triggered by changing position. Sometimes this happens when she is sitting or without movement. Patient is also episodes of passing out, with loss of consciousness for a few minutes. No convulsions. She has prodromal lightheadedness this happens. No post ictal confusion. No tongue biting or incontinence with these episodes. Also significantly noted is poor balance and gait with intermittent falling down. Her depression and anxiety are also poorly controlled at this point. She has significant hallucinations, racing thoughts, poor sleep. Patient's memory is getting worse over time. She does not remember seeing me in October 2013.  PRIOR HPI (03/13/12): 76 year old right-handed female with history of hypertension, hypercholesterolemia, depression, anxiety, here for evaluation of dizziness. For past 6-7 years patient has been having intermittent episodes of room spinning sensation, lasting 45 seconds at a time. No nausea vomiting. Episodes typically triggered when  she turns her head too quickly, tilt her head up or down. She denies any slurred speech, trouble talking, trouble swallowing, double vision. Patient had one episode while she was  sitting down, without head turning movement, where she felt dizzy, spinning, and then passed out.   REVIEW OF SYSTEMS: Full 14 system review of systems performed and negative except: speech diff depression anxiety memory tremors.    ALLERGIES: Allergies  Allergen Reactions  . Ampicillin-Sulbactam Sodium Other (See Comments)    Muscles draw up tight - severe headache angioedema  . Ampicillin Nausea Only and Other (See Comments)    Thrush.   . Toradol [Ketorolac Tromethamine] Nausea Only and Other (See Comments)    Headaches, back aches  . Tramadol Rash and Other (See Comments)    Headache.   Marland Kitchen Ambien [Zolpidem Tartrate] Other (See Comments)    unknown  . Cephalexin Other (See Comments)    unknown  . Ketorolac Nausea And Vomiting  . Percocet [Oxycodone-Acetaminophen] Itching and Rash    Vicodin is OK, "itches but takes Benadryl"  . Sulfamethoxazole-Trimethoprim Other (See Comments)    unknown  . Tape Other (See Comments)    Paper tape only.    HOME MEDICATIONS: Outpatient Medications Prior to Visit  Medication Sig Dispense Refill  . albuterol (PROVENTIL HFA;VENTOLIN HFA) 108 (90 BASE) MCG/ACT inhaler Inhale 1 puff into the lungs every 6 (six) hours as needed for wheezing or shortness of breath.    Marland Kitchen albuterol (PROVENTIL) (2.5 MG/3ML) 0.083% nebulizer solution Take 2.5 mg by nebulization every 6 (six) hours as needed for wheezing or shortness of breath.    . ALPRAZolam (XANAX) 0.5 MG tablet Take 0.5 mg by mouth at bedtime as needed for anxiety.    Marland Kitchen amLODipine (NORVASC) 5 MG tablet Take 5 mg by mouth daily.     . busPIRone (BUSPAR) 15 MG tablet Take 15 mg by mouth 2 (two) times daily.    . citalopram (CELEXA) 20 MG tablet Take 20 mg by mouth daily.     Marland Kitchen esomeprazole (NEXIUM) 40 MG capsule Take 40 mg by mouth daily at 12 noon.     . gabapentin (NEURONTIN) 400 MG capsule TAKE 1 CAPSULE BY MOUTH FOUR TIMES A DAY (Patient taking differently: TAKE 1 CAPSULE BY MOUTH THREE TIMES A  DAY) 120 capsule 0  . HYDROcodone-acetaminophen (NORCO) 7.5-325 MG tablet Take 1-2 tablets by mouth 2 (two) times daily as needed for moderate pain. (Patient taking differently: Take 1-2 tablets by mouth 2 (two) times daily. ) 40 tablet 0  . lovastatin (MEVACOR) 10 MG tablet Take 10 mg by mouth daily.    . meloxicam (MOBIC) 15 MG tablet Take 15 mg by mouth every evening.    . ondansetron (ZOFRAN) 4 MG tablet Take 4 mg by mouth every 8 (eight) hours as needed for nausea.     Marland Kitchen senna (SENOKOT) 8.6 MG TABS tablet Take 2 tablets by mouth. constipation    . traZODone (DESYREL) 100 MG tablet Take 50 mg by mouth at bedtime.     . VOLTAREN 1 % GEL Apply 2 g topically 4 (four) times daily as needed (for pain).     . Lidocaine 4 % PTCH Apply 1 patch topically daily. (Patient not taking: Reported on 12/04/2016) 15 patch 0  . acetaminophen (TYLENOL) 325 MG tablet Take 2 tablets (650 mg total) by mouth every 6 (six) hours as needed for mild pain (or Fever >/= 101).    . baclofen (LIORESAL) 10 MG  tablet Take 1 tablet (10 mg total) by mouth 3 (three) times daily. (Patient not taking: Reported on 12/04/2016) 30 tablet 0  . ciprofloxacin (CIPRO) 500 MG tablet Take 1 tablet (500 mg total) by mouth 2 (two) times daily. (Patient not taking: Reported on 11/07/2016) 14 tablet 0  . Docusate Calcium (STOOL SOFTENER PO) Take 2 tablets by mouth daily.    Marland Kitchen enoxaparin (LOVENOX) 40 MG/0.4ML injection Inject 0.4 mLs (40 mg total) into the skin daily. (Patient not taking: Reported on 10/23/2016) 14 Syringe 0  . HYDROcodone-acetaminophen (NORCO) 5-325 MG tablet Take 1 tablet by mouth 2 (two) times daily as needed for moderate pain. (Patient not taking: Reported on 12/04/2016) 30 tablet 0  . IRON PO Take 1 tablet by mouth 2 (two) times a week.     . methocarbamol (ROBAXIN) 500 MG tablet Take 250 mg by mouth 2 (two) times daily.    . methocarbamol (ROBAXIN) 500 MG tablet Take 1 tablet (500 mg total) by mouth every 6 (six) hours as  needed for muscle spasms. (Patient not taking: Reported on 12/04/2016) 30 tablet 2  . methylPREDNISolone (MEDROL) 4 MG tablet Medrol dose pack. Take as instructed (Patient not taking: Reported on 11/07/2016) 21 tablet 0  . MOVANTIK 25 MG TABS tablet TAKE 1 TABLET BY MOUTH DAILY (Patient not taking: Reported on 11/07/2016) 30 tablet 4  . oxyCODONE-acetaminophen (PERCOCET) 5-325 MG tablet Take 1 tablet by mouth every 8 (eight) hours as needed for severe pain. (Patient not taking: Reported on 11/07/2016) 30 tablet 0  . polyethylene glycol powder (GLYCOLAX/MIRALAX) powder Take 17 g by mouth daily. For constipation    . tamsulosin (FLOMAX) 0.4 MG CAPS capsule Take 1 capsule (0.4 mg total) by mouth daily. (Patient not taking: Reported on 10/23/2016) 30 capsule   . vitamin B-12 (CYANOCOBALAMIN) 1000 MCG tablet Take 2,000 mcg by mouth daily.      No facility-administered medications prior to visit.     PAST MEDICAL HISTORY: Past Medical History:  Diagnosis Date  . Anemia   . B12 deficiency   . Bladder incontinence   . Blind left eye   . Cataract   . Cervical spine fracture ([place])   . Compression fracture    C7,  upper T spine -compression fx  . COPD (chronic obstructive pulmonary disease) ([place])   . DDD (degenerative disc disease), lumbar   . Depression    major  . GERD (gastroesophageal reflux disease)   . Hiatal hernia   . Hyperlipidemia   . Hypertension   . 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 Macular degeneration   . Osteoarthritis   . Osteoporosis   . Reactive airway disease   . Rupture of bowel ([place])   . Stroke [facility])    "mini stroke"  . Stroke [facility])     PAST SURGICAL HISTORY: Past Surgical History:  Procedure Laterality Date  . ABDOMINAL HYSTERECTOMY    . ABDOMINAL SURGERY    . APPENDECTOMY    . BLADDER SURGERY    . CHOLECYSTECTOMY    . COLON SURGERY    . EYE SURGERY     cataracts  . INTRAMEDULLARY (IM) NAIL INTERTROCHANTERIC Right 09/13/2016   Procedure:  INTRAMEDULLARY (IM) NAIL INTERTROCHANTRIC RIGHT;  Surgeon: Leandrew Koyanagi, MD;  Location: [facility];  Service: Orthopedics;  Laterality: Right;  . JOINT REPLACEMENT    . KNEE SURGERY    . OSTOMY    . RECTOCELE REPAIR      FAMILY HISTORY: Family History  Problem Relation Age of Onset  . Heart failure Mother   . Heart attack Father     SOCIAL HISTORY:  Social History   Social History  . Marital status: Widowed    Spouse name: N/A  . Number of children: 3  . Years of education: middle sch   Occupational History  . retired     n/a   Social History Main Topics  . Smoking status: Former Smoker    Packs/day: 1.50    Years: 30.00  . Smokeless tobacco: Never Used     Comment: quit 2013  . Alcohol use No  . Drug use: No  . Sexual activity: No   Other Topics Concern  . Not on file   Social History Narrative   ** Merged History Encounter **       Patient lives at home with daughter. Caffeine Use: 4 cups daily     PHYSICAL EXAM  Vitals:   12/04/16 1345  BP: 130/86  Pulse: (!) 104  Weight: 178 lb 9.6 oz (81 kg)  Height: 5\' 5"  (1.651 m)    Not recorded      Body mass index is 29.72 kg/m.  MMSE - Mini Mental State Exam 12/06/2014 12/06/2014  Orientation to time - 4  Orientation to Place - 4  Registration - 3  Attention/ Calculation - 0  Recall - 1  Language- name 2 objects - 2  Language- repeat - 0  Language- follow 3 step command - 3  Language- read & follow direction - 1  Write a sentence (No Data) 0  Write a sentence-comments unable to see well enough -  Copy design (No Data) 0  Copy design-comments unable to see well enough -  Total score - 18      GENERAL EXAM: Patient is in no distress; well developed, nourished and groomed  CARDIOVASCULAR: Regular rate and rhythm, no murmurs, no carotid bruits  NEUROLOGIC: MENTAL STATUS: awake, alert, language fluent, comprehension intact, naming intact, fund of knowledge appropriate CRANIAL NERVE: pupils  equal and reactive to light, visual fields full to confrontation, extraocular muscles intact, no nystagmus, facial sensation and strength symmetric, LEFT PTOSIS, hearing intact, palate elevates symmetrically, uvula midline, shoulder shrug symmetric, tongue midline. MOTOR: normal bulk and tone, full strength in the BUE, BLE; MILD POSTURAL AND ACTION TREMOR SENSORY: normal and symmetric to light touch, temperature, vibration COORDINATION: finger-nose-finger --> DYSMETRIA REFLEXES: deep tendon reflexes present and symmetric GAIT/STATION: UNSTEADY GAIT    DIAGNOSTIC DATA (LABS, IMAGING, TESTING) - I reviewed patient records, labs, notes, testing and imaging myself where available.  Lab Results  Component Value Date   WBC 8.1 10/23/2016   HGB 12.3 10/23/2016   HCT 39.7 10/23/2016   MCV 95.0 10/23/2016   PLT 205 10/23/2016      Component Value Date/Time   NA 140 11/19/2016 1640   K 3.8 11/19/2016 1640   CL 105 11/19/2016 1640   CO2 28 11/19/2016 1640   GLUCOSE 92 11/19/2016 1640   BUN 8 11/19/2016 1640   CREATININE 0.72 11/19/2016 1640   CALCIUM 9.0 11/19/2016 1640   PROT 8.1 11/19/2016 1640   ALBUMIN 3.9 11/19/2016 1640   AST 25 11/19/2016 1640   ALT 12 (L) 11/19/2016 1640   ALKPHOS 82 11/19/2016 1640   BILITOT 0.4 11/19/2016 1640   GFRNONAA >60 11/19/2016 1640   GFRAA >60 11/19/2016 1640   No results found for: CHOL Lab Results  Component Value Date   HGBA1C 5.6 05/22/2015  Lab Results  Component Value Date   VITAMINB12 1,888 (H) 11/19/2016   Lab Results  Component Value Date   TSH 1.273 05/22/2015    03/30/12 MRI BRAIN - 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 There mild changes of chronic microvascular ischemia and generalized cerebral atrophy.  03/30/12 MRA head - mild atheromatous changes in the terminal branch vessels.  03/30/12 MRA neck - normal  06/11/13 MRI brain (without) demonstrating: 1. Few punctate foci of  non-specific gliosis in the periventricular and subcortical white matter, likely chronic small vessel ischemic disease. 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 2. No acute findings.  10/08/14 MRI CERVICAL SPINE: Acute moderate C7 compression fracture. No malalignment. 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 Multilevel neural foraminal narrowing: Moderate to severe on the LEFT at C4-5 and C5-6.  10/08/14 MRI THORACIC SPINE: Acute moderate T2 compression fracture. Mild acute T3 compression fracture. No malalignment.  02/20/15 CT head / cervical spine 1. No evidence of intracranial or cervical spine injury. 2. Right frontal scalp laceration without fracture. 3. Chronic left retinal detachment.  09/13/16 CT head / cervical - No acute intracranial abnormalities. Chronic atrophy and small vessel ischemic changes. - Normal alignment of the cervical spine. Chronic endplate compression deformities at C7 and T2 unchanged since previous study, likely representing osteoporosis. Mild degenerative changes. No acute displaced fractures identified.  09/13/16 CT right hip 1. Mildly displaced intertrochanteric fracture with comminuted portion along the greater trochanter and minimal comminution along the basicervical region of the left femoral neck. There could be a small component of intra-articular basicervical involvement. Mild apex anterior angulation of the dominant fracture site as shown on image 31/7.     ASSESSMENT AND PLAN  76 y.o. year old female here with constellation of issues/symptoms, including gait diff, recurrent falls, dizziness, memory loss, depression, likely related to increased stress, decreased PO intake, polypharmacy, prior strokes, possible dementia, arthritis, deconditioning and poor judgement/insight.     "Dizziness" (balance difficulty + vertigo + syncope)  Memory loss - MMSE 28/30  (05/03/13)--> 18/30 (12/06/14)  Post-traumatic and cervicogenic headaches  Depression  Anxiety  Poor PO intake  Gastroparesis    Dx:  Muscle spasm - Plan: Ammonia, Comprehensive metabolic panel, CBC with Differential/Platelet, TSH, T4, Free  Tremor - Plan: Ammonia, Comprehensive metabolic panel, CBC with Differential/Platelet, TSH, T4, Free  Dizziness and giddiness  Gait difficulty  Memory loss     PLAN:  - check labs today for tremor and muscle jerk evaluation  - advised to focus on gentle / supervised physical activity in daytime, proper sleep at night, small frequent / regular meals, increased water intake  - recommend to follow up with PCP and pain mgmt and psychiatry / psychology  Orders Placed This Encounter  Procedures  . Ammonia  . Comprehensive metabolic panel  . CBC with Differential/Platelet  . TSH  . T4, Free   Return in about 6 months (around 06/06/2017).    Penni Bombard, MD 2/53/6644, 0:34 PM Certified in Neurology, Neurophysiology and Neuroimaging  [facility] [address] [place], [place] [address] [phone]

## 2016-12-05 ENCOUNTER — Ambulatory Visit
Admission: RE | Admit: 2016-12-05 | Discharge: 2016-12-05 | Disposition: A | Discharge status: Home / Self Care | Payer: Medicare Other | Source: Ambulatory Visit | Attending: Orthopaedic Surgery | Admitting: Orthopaedic Surgery

## 2016-12-05 DIAGNOSIS — M25551 Pain in right hip: Secondary | ICD-10-CM

## 2016-12-05 LAB — CBC WITH DIFFERENTIAL/PLATELET
Basophils Absolute: 0 10*3/uL (ref 0.0–0.2)
Basos: 0 %
EOS (ABSOLUTE): 0.3 10*3/uL (ref 0.0–0.4)
Eos: 4 %
Hematocrit: 44.8 % (ref 34.0–46.6)
Hemoglobin: 14.7 g/dL (ref 11.1–15.9)
Immature Grans (Abs): 0 10*3/uL (ref 0.0–0.1)
Immature Granulocytes: 0 %
Lymphocytes Absolute: 2.6 10*3/uL (ref 0.7–3.1)
Lymphs: 37 %
MCH: 30.2 pg (ref 26.6–33.0)
MCHC: 32.8 g/dL (ref 31.5–35.7)
MCV: 92 fL (ref 79–97)
Monocytes Absolute: 0.5 10*3/uL (ref 0.1–0.9)
Monocytes: 7 %
Neutrophils Absolute: 3.7 10*3/uL (ref 1.4–7.0)
Neutrophils: 52 %
Platelets: 263 10*3/uL (ref 150–379)
RBC: 4.86 x10E6/uL (ref 3.77–5.28)
RDW: 14.4 % (ref 12.3–15.4)
WBC: 7.2 10*3/uL (ref 3.4–10.8)

## 2016-12-05 LAB — TSH: TSH: 1.37 u[IU]/mL (ref 0.450–4.500)

## 2016-12-05 LAB — COMPREHENSIVE METABOLIC PANEL
ALT: 15 IU/L (ref 0–32)
AST: 19 IU/L (ref 0–40)
Albumin/Globulin Ratio: 1.1 — ABNORMAL LOW (ref 1.2–2.2)
Albumin: 4 g/dL (ref 3.5–4.8)
Alkaline Phosphatase: 104 IU/L (ref 39–117)
BUN/Creatinine Ratio: 6 — ABNORMAL LOW (ref 12–28)
BUN: 7 mg/dL — ABNORMAL LOW (ref 8–27)
Bilirubin Total: 0.2 mg/dL (ref 0.0–1.2)
CO2: 24 mmol/L (ref 20–29)
Calcium: 9.4 mg/dL (ref 8.7–10.3)
Chloride: 107 mmol/L — ABNORMAL HIGH (ref 96–106)
Creatinine, Ser: 1.09 mg/dL — ABNORMAL HIGH (ref 0.57–1.00)
GFR calc Af Amer: 57 mL/min/{1.73_m2} — ABNORMAL LOW (ref >59)
GFR calc non Af Amer: 50 mL/min/{1.73_m2} — ABNORMAL LOW (ref >59)
Globulin, Total: 3.7 g/dL (ref 1.5–4.5)
Glucose: 105 mg/dL — ABNORMAL HIGH (ref 65–99)
Potassium: 4 mmol/L (ref 3.5–5.2)
Sodium: 145 mmol/L — ABNORMAL HIGH (ref 134–144)
Total Protein: 7.7 g/dL (ref 6.0–8.5)

## 2016-12-05 LAB — T4, FREE: Free T4: 1.04 ng/dL (ref 0.82–1.77)

## 2016-12-05 LAB — AMMONIA: Ammonia: 66 ug/dL (ref 19–87)

## 2016-12-06 ENCOUNTER — Ambulatory Visit (INDEPENDENT_AMBULATORY_CARE_PROVIDER_SITE_OTHER): Payer: Medicare Other | Admitting: Orthopaedic Surgery

## 2016-12-06 ENCOUNTER — Other Ambulatory Visit (INDEPENDENT_AMBULATORY_CARE_PROVIDER_SITE_OTHER): Payer: Self-pay | Admitting: Orthopaedic Surgery

## 2016-12-18 ENCOUNTER — Ambulatory Visit: Payer: Medicare Other | Admitting: Diagnostic Neuroimaging

## 2016-12-19 ENCOUNTER — Telehealth: Payer: Self-pay | Admitting: *Deleted

## 2016-12-19 NOTE — Telephone Encounter (Signed)
Called daughter, [name] and told her that lab results (borderline sugar, kidney an sodium levels, but overall nothing major.  Continue current plan.. She verbalized understanding.  Results to pcp and Dr. Alvester Chou.

## 2016-12-19 NOTE — Telephone Encounter (Signed)
Pt's daughter returned the call. She will be available before 2 and after 2:30 today.

## 2016-12-19 NOTE — Telephone Encounter (Signed)
LMVM for pt or [name] to return call for lab results.

## 2016-12-19 NOTE — Telephone Encounter (Signed)
-----   Message from Penni Bombard, MD sent at 12/18/2016  5:22 PM EDT ----- Borderline sugar, kidney function and sodium levels, but overall not that major. Continue current plan. Please call patient. -VRP

## 2016-12-24 ENCOUNTER — Telehealth (INDEPENDENT_AMBULATORY_CARE_PROVIDER_SITE_OTHER): Payer: Self-pay | Admitting: Orthopaedic Surgery

## 2016-12-24 ENCOUNTER — Other Ambulatory Visit: Payer: Self-pay | Admitting: Adult Health

## 2016-12-24 NOTE — Telephone Encounter (Signed)
PT DAUGHTER CALLED AND REQUESTED SOMETHING FOR PT PAIN AS PAIN CLINIC HAS NOT GOT IN CONTACT WITH PT YET. PLEASE ADVISE.  [phone] CATHY BRADY

## 2016-12-24 NOTE — Telephone Encounter (Signed)
See message below °

## 2016-12-24 NOTE — Telephone Encounter (Signed)
Norco #[id number].  She should come in for f/u too

## 2016-12-25 ENCOUNTER — Other Ambulatory Visit: Payer: Self-pay | Admitting: Adult Health

## 2016-12-25 ENCOUNTER — Telehealth (INDEPENDENT_AMBULATORY_CARE_PROVIDER_SITE_OTHER): Payer: Self-pay

## 2016-12-25 ENCOUNTER — Other Ambulatory Visit (INDEPENDENT_AMBULATORY_CARE_PROVIDER_SITE_OTHER): Payer: Self-pay | Admitting: Orthopaedic Surgery

## 2016-12-25 DIAGNOSIS — G894 Chronic pain syndrome: Secondary | ICD-10-CM

## 2016-12-25 DIAGNOSIS — Z9181 History of falling: Secondary | ICD-10-CM

## 2016-12-25 MED ORDER — HYDROCODONE-ACETAMINOPHEN 7.5-325 MG PO TABS: 1.0000 | ORAL_TABLET | Freq: Two times a day (BID) | ORAL | 0 refills | Status: DC | PRN

## 2016-12-25 NOTE — Telephone Encounter (Signed)
Called Patient to let her know about Monovisc being approved and we can buy and bill. But she had no idea what I was talking about. Advised her to tell her daughter to call us so we can proceed with monovisc

## 2016-12-25 NOTE — Telephone Encounter (Signed)
Called patient advised Rx will be ready for pick up tomorrow morning since Dr Erlinda Hong is in [place] all day today. Also advised that she needs a f/u appt. LMOM advised to callback to schedule.

## 2016-12-25 NOTE — Addendum Note (Signed)
Addended by: Precious Bard on: 12/25/2016 09:06 AM   Modules accepted: Orders

## 2016-12-25 NOTE — Telephone Encounter (Signed)
Reprinted Rx did not print out the first time.

## 2017-01-01 ENCOUNTER — Ambulatory Visit
Admission: RE | Admit: 2017-01-01 | Discharge: 2017-01-01 | Disposition: A | Discharge status: Home / Self Care | Payer: Medicare Other | Source: Ambulatory Visit | Attending: Adult Health | Admitting: Adult Health

## 2017-01-01 DIAGNOSIS — M6250 Muscle wasting and atrophy, not elsewhere classified, unspecified site: Secondary | ICD-10-CM | POA: Diagnosis not present

## 2017-01-01 DIAGNOSIS — Z9181 History of falling: Secondary | ICD-10-CM

## 2017-01-01 DIAGNOSIS — G894 Chronic pain syndrome: Secondary | ICD-10-CM

## 2017-01-01 DIAGNOSIS — R600 Localized edema: Secondary | ICD-10-CM | POA: Insufficient documentation

## 2017-01-13 ENCOUNTER — Ambulatory Visit (INDEPENDENT_AMBULATORY_CARE_PROVIDER_SITE_OTHER): Payer: Medicare Other | Admitting: Orthopaedic Surgery

## 2017-01-14 ENCOUNTER — Telehealth (INDEPENDENT_AMBULATORY_CARE_PROVIDER_SITE_OTHER): Payer: Self-pay | Admitting: Orthopaedic Surgery

## 2017-01-14 NOTE — Telephone Encounter (Signed)
Patient's [name]) called advised her mom need Rx refilled (Norco) The number to contact patient is [phone]

## 2017-01-14 NOTE — Telephone Encounter (Signed)
She needs to f/u before getting anymore pain meds

## 2017-01-14 NOTE — Telephone Encounter (Signed)
See message below °

## 2017-01-15 NOTE — Telephone Encounter (Signed)
Called LMOM needs to make appt before getting any more pain meds

## 2017-01-20 ENCOUNTER — Ambulatory Visit (INDEPENDENT_AMBULATORY_CARE_PROVIDER_SITE_OTHER): Payer: Medicare Other | Admitting: Orthopaedic Surgery

## 2017-01-20 DIAGNOSIS — M25561 Pain in right knee: Secondary | ICD-10-CM

## 2017-01-20 DIAGNOSIS — G8929 Other chronic pain: Secondary | ICD-10-CM

## 2017-01-20 DIAGNOSIS — S72141A Displaced intertrochanteric fracture of right femur, initial encounter for closed fracture: Secondary | ICD-10-CM

## 2017-01-20 MED ORDER — HYDROCODONE-ACETAMINOPHEN 5-325 MG PO TABS: 1.0000 | ORAL_TABLET | Freq: Three times a day (TID) | ORAL | 0 refills | Status: DC | PRN

## 2017-01-20 NOTE — Progress Notes (Signed)
[name] follows up today for her right hip pain. She had a CT scan about 6 weeks ago which showed an acute nondisplaced fracture of the intertrochanteric region. The hardware is stable. She has trouble lying on the right side. She does endorse seeing groin pain. She is also very tearful and somewhat histrionic today. She is 4 months status post intramedullary fixation of right hip fracture. She recently had MRIs of her right knee and hip but were limited due to artifact.  On physical exam her surgical scars are healed. She endorses chronic pain in her right hip with and without weightbearing or activity. Very limited range of motion secondary to guarding due to pain.  From my standpoint her fracture is stable. I think the main issue is her anxiety and depression which has severely limited her progress. Her chronic pain syndrome is also a significant limiting factor. All these findings were discussed with the patient and her daughter. From my standpoint she is stable and can follow up as needed.

## 2017-01-26 IMAGING — CR DG SHOULDER 2+V*L*
5 series · 5 of 5 positions shown · non-contrast
Comparison: Chest x-ray which included portions of the left
shoulder October 25, 2014.

CLINICAL DATA: Chronic left shoulder pain without known injury

EXAM:
LEFT SHOULDER - 2+ VIEW

[w shoulder ap internal left]
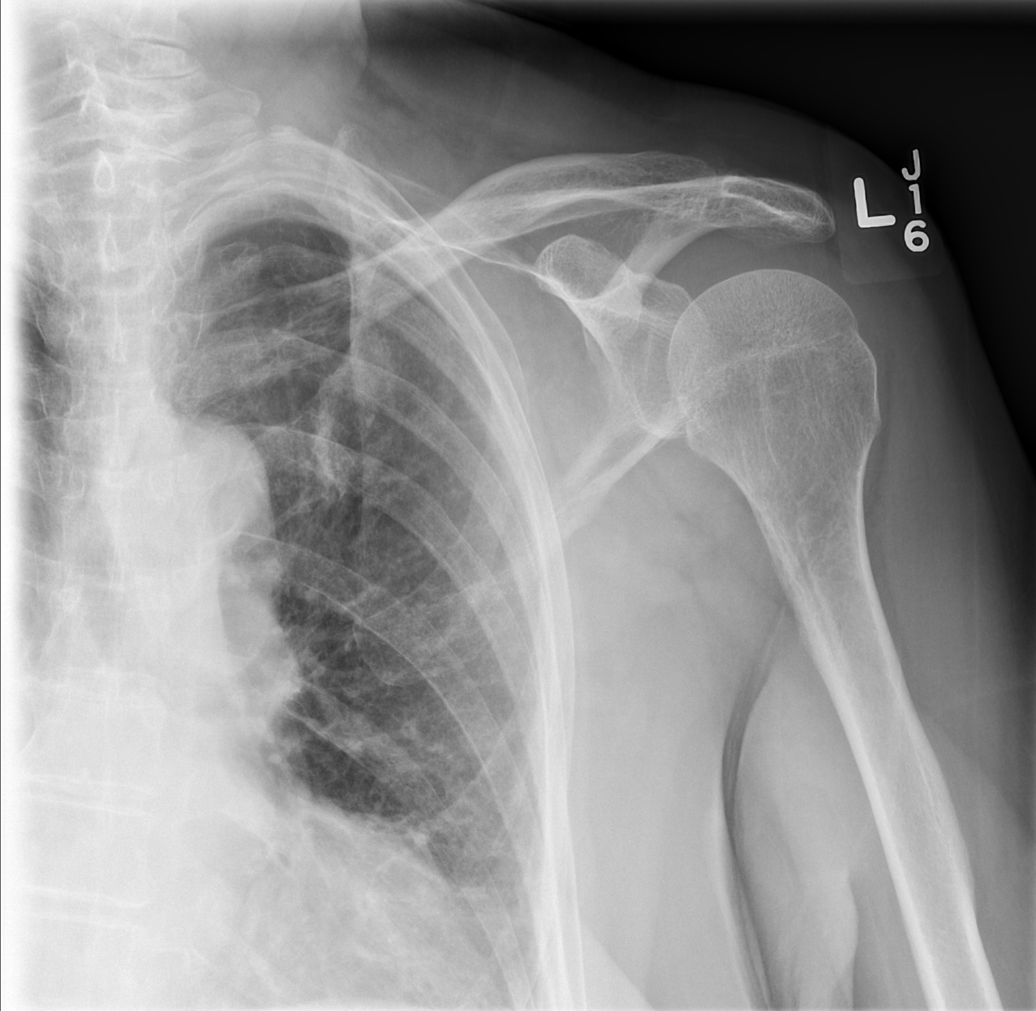

[w shoulder ap external left]
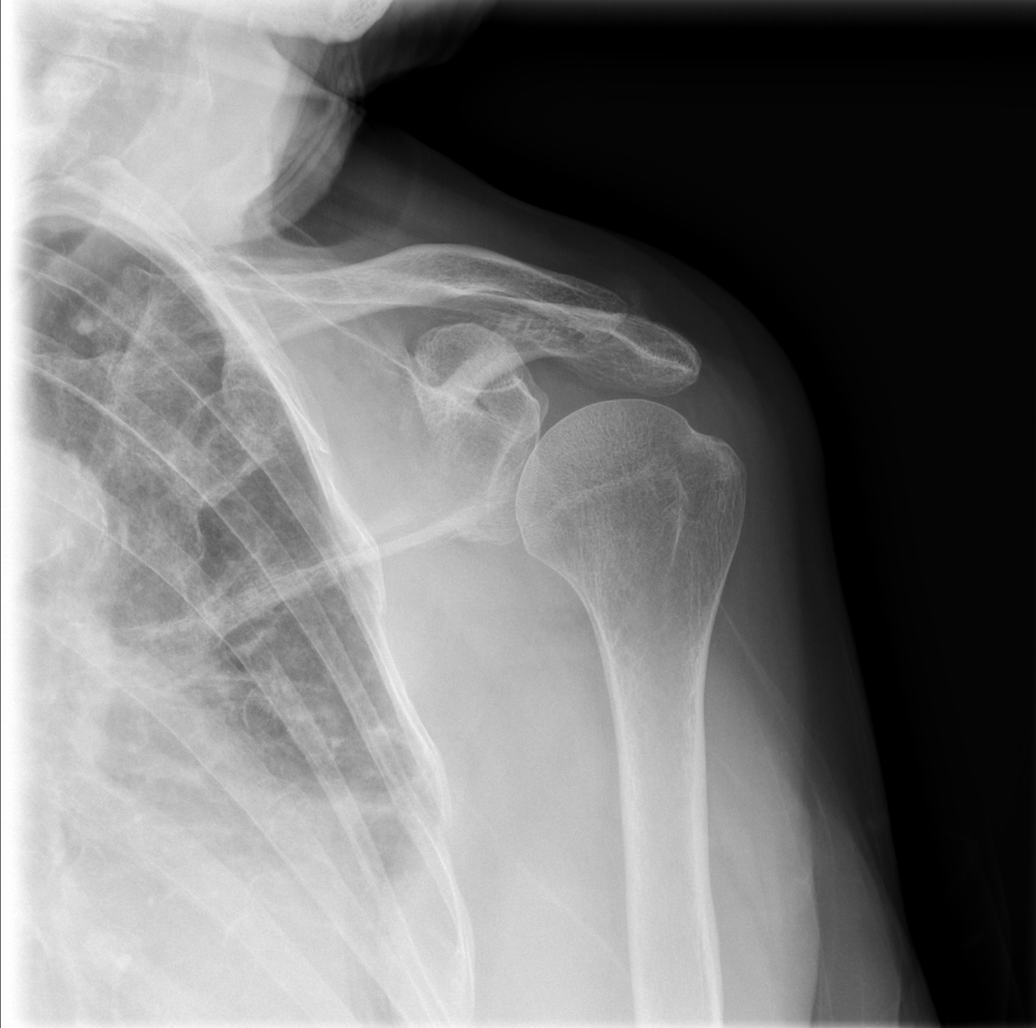

[w shoulder y view left (1 of 2)]
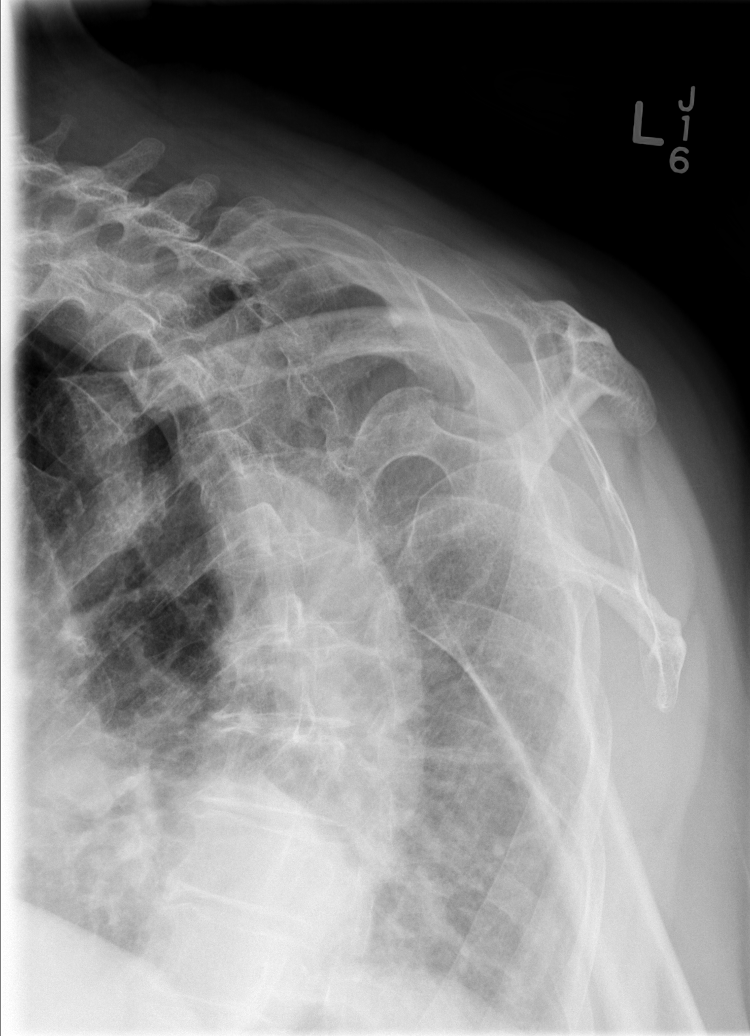

[w shoulder y view left (2 of 2)]
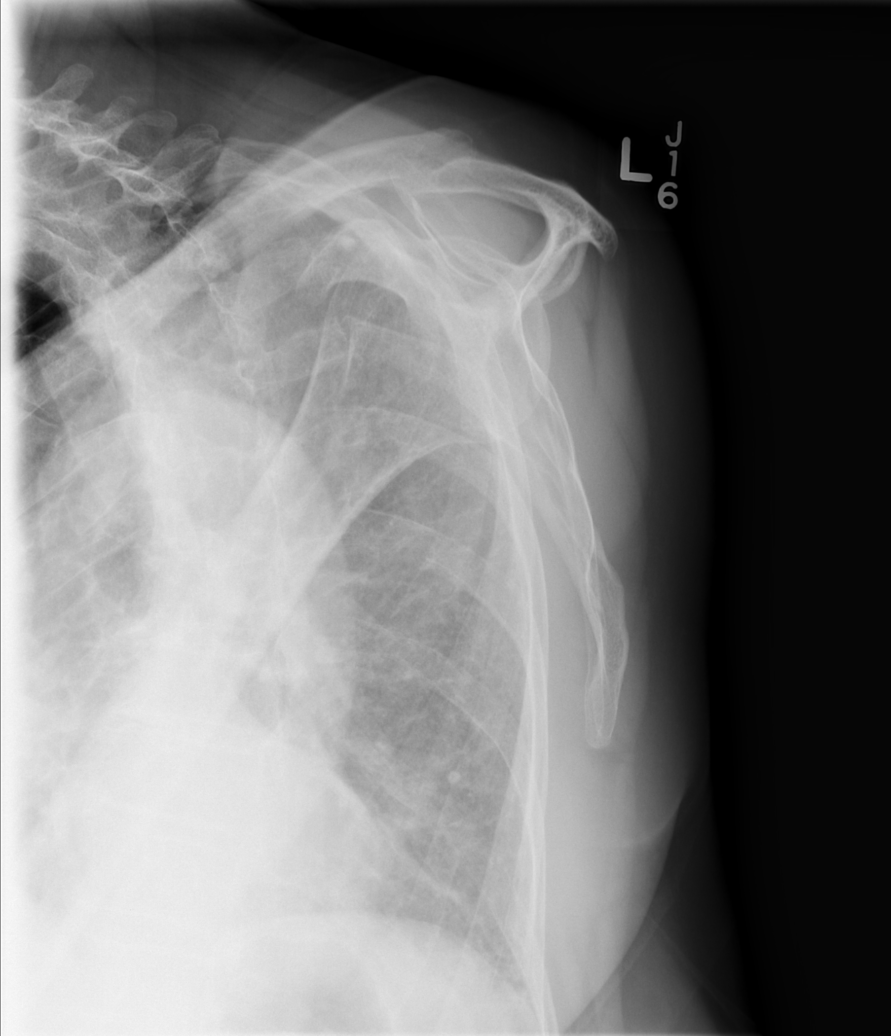

[w shoulder axillary left *]
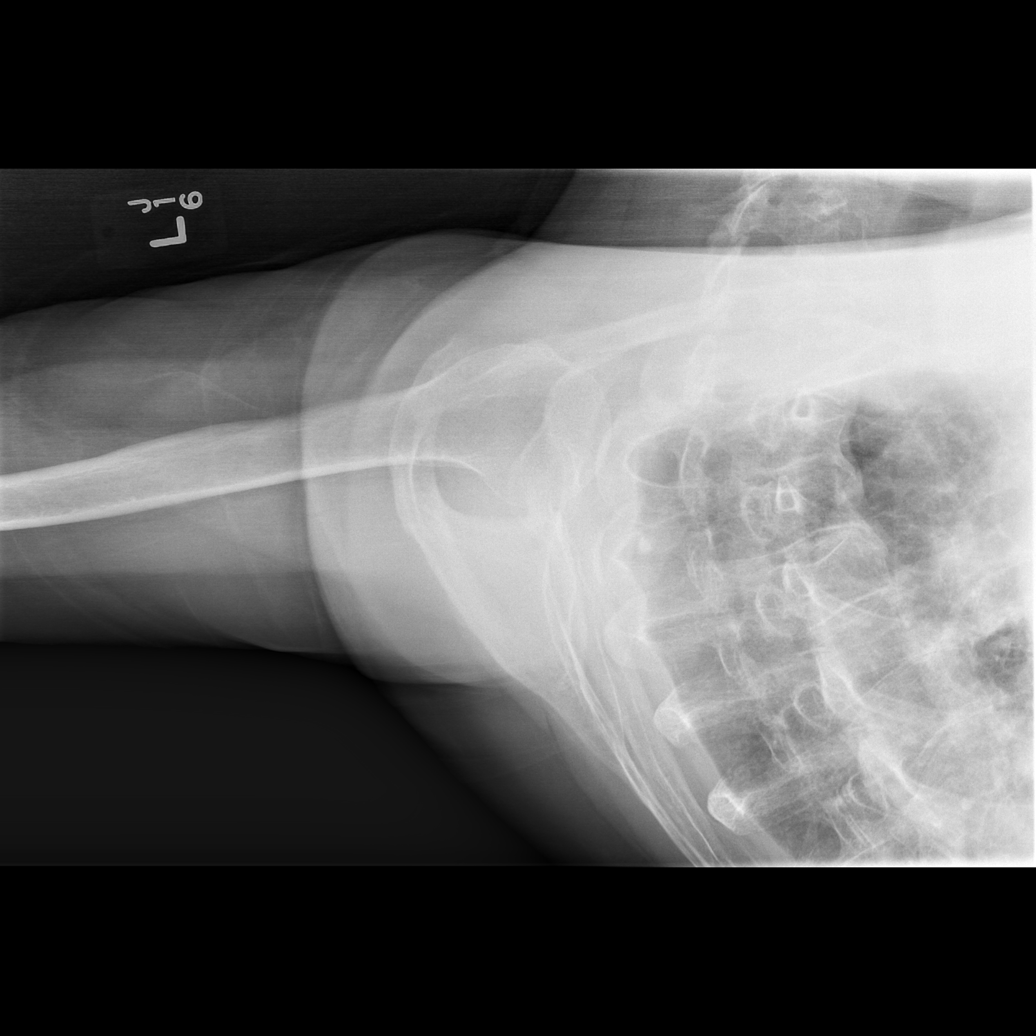

[5 of 5 positions shown; findings below may reference images not displayed]

FINDINGS: The bones of the left shoulder are adequately mineralized. The
glenohumeral joint is unremarkable. The AC joint exhibits mild
calcific change in the surrounding soft tissues. The joint space
itself appears reasonably well-maintained. The subacromial
subdeltoid space is preserved. The observed portions of the upper
left ribs are normal.
IMPRESSION: There is no acute bony abnormality of the left shoulder. Mild
degenerative change of the AC joint is suspected. Given the chronic
persistent symptoms, MRI may be a useful next imaging step.

## 2017-02-05 ENCOUNTER — Telehealth (INDEPENDENT_AMBULATORY_CARE_PROVIDER_SITE_OTHER): Payer: Self-pay

## 2017-02-05 DIAGNOSIS — S72141A Displaced intertrochanteric fracture of right femur, initial encounter for closed fracture: Secondary | ICD-10-CM

## 2017-02-05 NOTE — Telephone Encounter (Signed)
Patient daughter called stating that Vicodin is not helping patient at all.  Stated that Right Hip is very painful.  Would like to  Know what needs to be done?  CB# [phone].  Please advise.

## 2017-02-05 NOTE — Telephone Encounter (Signed)
Please advise.   I spoke with daughter and advised Dr. Erlinda Hong is in surgery today and it may be tomorrow before we are able to get her a response.

## 2017-02-05 NOTE — Telephone Encounter (Signed)
She should be referred to a pain management clinic

## 2017-02-05 NOTE — Telephone Encounter (Signed)
I was not able to call patient's daughter back today. Please call to advise. (I did not enter referral since I had not talked with her).

## 2017-02-06 NOTE — Addendum Note (Signed)
Addended by: Precious Bard on: 02/06/2017 11:06 AM   Modules accepted: Orders

## 2017-02-06 NOTE — Telephone Encounter (Signed)
Order made. If they call back please advise it will take up to 4-6 weeks to get in

## 2017-02-12 ENCOUNTER — Encounter: Payer: Self-pay | Admitting: Pain Medicine

## 2017-02-12 ENCOUNTER — Ambulatory Visit: Payer: Medicare Other | Attending: Pain Medicine | Admitting: Pain Medicine

## 2017-02-12 VITALS — BP 138/90 | HR 77 | Temp 98.6°F | Resp 18 | Ht 65.0 in | Wt 180.0 lb

## 2017-02-12 DIAGNOSIS — M4802 Spinal stenosis, cervical region: Secondary | ICD-10-CM | POA: Insufficient documentation

## 2017-02-12 DIAGNOSIS — M810 Age-related osteoporosis without current pathological fracture: Secondary | ICD-10-CM | POA: Insufficient documentation

## 2017-02-12 DIAGNOSIS — M25561 Pain in right knee: Secondary | ICD-10-CM | POA: Diagnosis not present

## 2017-02-12 DIAGNOSIS — Z885 Allergy status to narcotic agent status: Secondary | ICD-10-CM | POA: Diagnosis not present

## 2017-02-12 DIAGNOSIS — F419 Anxiety disorder, unspecified: Secondary | ICD-10-CM | POA: Insufficient documentation

## 2017-02-12 DIAGNOSIS — E7800 Pure hypercholesterolemia, unspecified: Secondary | ICD-10-CM | POA: Diagnosis not present

## 2017-02-12 DIAGNOSIS — G894 Chronic pain syndrome: Secondary | ICD-10-CM | POA: Diagnosis not present

## 2017-02-12 DIAGNOSIS — M791 Myalgia: Secondary | ICD-10-CM | POA: Diagnosis not present

## 2017-02-12 DIAGNOSIS — Z96652 Presence of left artificial knee joint: Secondary | ICD-10-CM | POA: Insufficient documentation

## 2017-02-12 DIAGNOSIS — T402X5A Adverse effect of other opioids, initial encounter: Secondary | ICD-10-CM | POA: Diagnosis not present

## 2017-02-12 DIAGNOSIS — Z789 Other specified health status: Secondary | ICD-10-CM | POA: Diagnosis not present

## 2017-02-12 DIAGNOSIS — Z79891 Long term (current) use of opiate analgesic: Secondary | ICD-10-CM | POA: Diagnosis not present

## 2017-02-12 DIAGNOSIS — M4696 Unspecified inflammatory spondylopathy, lumbar region: Secondary | ICD-10-CM | POA: Diagnosis not present

## 2017-02-12 DIAGNOSIS — M5030 Other cervical disc degeneration, unspecified cervical region: Secondary | ICD-10-CM | POA: Diagnosis not present

## 2017-02-12 DIAGNOSIS — K5903 Drug induced constipation: Secondary | ICD-10-CM

## 2017-02-12 DIAGNOSIS — M542 Cervicalgia: Secondary | ICD-10-CM | POA: Diagnosis not present

## 2017-02-12 DIAGNOSIS — M25511 Pain in right shoulder: Secondary | ICD-10-CM | POA: Diagnosis not present

## 2017-02-12 DIAGNOSIS — M545 Low back pain, unspecified: Secondary | ICD-10-CM

## 2017-02-12 DIAGNOSIS — K5900 Constipation, unspecified: Secondary | ICD-10-CM | POA: Insufficient documentation

## 2017-02-12 DIAGNOSIS — M899 Disorder of bone, unspecified: Secondary | ICD-10-CM | POA: Diagnosis not present

## 2017-02-12 DIAGNOSIS — F329 Major depressive disorder, single episode, unspecified: Secondary | ICD-10-CM | POA: Diagnosis not present

## 2017-02-12 DIAGNOSIS — Z791 Long term (current) use of non-steroidal anti-inflammatories (NSAID): Secondary | ICD-10-CM

## 2017-02-12 DIAGNOSIS — G8929 Other chronic pain: Secondary | ICD-10-CM

## 2017-02-12 DIAGNOSIS — N189 Chronic kidney disease, unspecified: Secondary | ICD-10-CM | POA: Insufficient documentation

## 2017-02-12 DIAGNOSIS — Z880 Allergy status to penicillin: Secondary | ICD-10-CM | POA: Diagnosis not present

## 2017-02-12 DIAGNOSIS — M25512 Pain in left shoulder: Secondary | ICD-10-CM

## 2017-02-12 DIAGNOSIS — R1314 Dysphagia, pharyngoesophageal phase: Secondary | ICD-10-CM | POA: Diagnosis not present

## 2017-02-12 DIAGNOSIS — J449 Chronic obstructive pulmonary disease, unspecified: Secondary | ICD-10-CM | POA: Insufficient documentation

## 2017-02-12 DIAGNOSIS — F1390 Sedative, hypnotic, or anxiolytic use, unspecified, uncomplicated: Secondary | ICD-10-CM | POA: Insufficient documentation

## 2017-02-12 DIAGNOSIS — I129 Hypertensive chronic kidney disease with stage 1 through stage 4 chronic kidney disease, or unspecified chronic kidney disease: Secondary | ICD-10-CM | POA: Insufficient documentation

## 2017-02-12 DIAGNOSIS — Z79899 Other long term (current) drug therapy: Secondary | ICD-10-CM | POA: Diagnosis not present

## 2017-02-12 DIAGNOSIS — M47816 Spondylosis without myelopathy or radiculopathy, lumbar region: Secondary | ICD-10-CM

## 2017-02-12 DIAGNOSIS — M79604 Pain in right leg: Secondary | ICD-10-CM | POA: Diagnosis not present

## 2017-02-12 DIAGNOSIS — K219 Gastro-esophageal reflux disease without esophagitis: Secondary | ICD-10-CM | POA: Insufficient documentation

## 2017-02-12 DIAGNOSIS — Z8673 Personal history of transient ischemic attack (TIA), and cerebral infarction without residual deficits: Secondary | ICD-10-CM | POA: Insufficient documentation

## 2017-02-12 DIAGNOSIS — Z87891 Personal history of nicotine dependence: Secondary | ICD-10-CM | POA: Diagnosis not present

## 2017-02-12 DIAGNOSIS — F1190 Opioid use, unspecified, uncomplicated: Secondary | ICD-10-CM | POA: Insufficient documentation

## 2017-02-12 DIAGNOSIS — M7918 Myalgia, other site: Secondary | ICD-10-CM | POA: Insufficient documentation

## 2017-02-12 MED ORDER — OXYCODONE HCL 5 MG PO TABS: 5.0000 mg | ORAL_TABLET | Freq: Three times a day (TID) | ORAL | 0 refills | Status: DC | PRN

## 2017-02-12 NOTE — Progress Notes (Signed)
Patient's Name: [name]  MRN: [id number]  Referring Provider: Wenda Low, MD  DOB: 1940-07-25  PCP: Wenda Low, MD  DOS: 02/12/2017  Note by: Gaspar Cola, MD  Service setting: Ambulatory outpatient  Specialty: Interventional Pain Management  Location: [facility] (AMB) Pain Management Facility    Patient type: Established   Primary Reason(s) for Visit: Encounter for evaluation before starting new chronic pain management plan of care (Level of risk: moderate) CC: Hip Pain (right) and Leg Pain (right)  HPI  [name] is a 76 y.o. year old, female patient, who comes today for a follow-up evaluation to review the test results and decide on a treatment plan. 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[place]); Hypertensive kidney disease, malignant; Chronic shoulder pain (Left); Chronic low back pain (Tertiary Area of Pain) (Bilateral) (L>R); CAFL (chronic airflow limitation) ([place]); Apoplectic vertigo; Duodenogastric reflux; History of stroke; Hoarseness; Benign essential HTN; Chronic knee pain (Primary Area of Pain) (Right); Disc disorder; Depression, major, in partial remission ([place]); Major depression in remission ([place]); Involutional osteoporosis; Pharyngoesophageal dysphagia; Status post revision of total replacement of right knee; Status post total left knee replacement; Infection of the upper respiratory tract; Bed wetting; UTI (urinary tract infection); Long term current use of opiate analgesic; Long term prescription opiate use;  Chronic pain syndrome; Chronic neck pain (Secondary Area of Pain) (Bilateral) (L>R); Chronic hip pain (Right); Rib pain; DDD (degenerative disc disease), cervical; Lumbar facet arthropathy ([place]); Chronic myofascial pain; Lumbar facet syndrome (Bilateral) (L>R); Long term prescription benzodiazepine use; Opiate use; Disorder of skeletal system; Pharmacologic therapy; Problems influencing health status; Opioid-induced constipation (OIC); and NSAID long-term use on her problem list. Her primarily concern today is the Hip Pain (right) and Leg Pain (right)  Pain Assessment: Location: Right Hip Radiating: radiates into right leg to calf  on the side Onset: More than a month ago Duration: Chronic pain Quality: Aching, Sharp, Stabbing, Dull Severity: 9 /10 (self-reported pain score)  Note: Reported level is inconsistent with clinical observations. Clinically the patient looks like a 2/10 Information on the proper use of the pain scale provided to the patient today Timing: Constant Modifying factors: denies  [name] comes in today for a follow-up visit after her initial evaluation on 11/07/2016. Today we went over the results of her tests. These were explained in "Layman's terms". During today's appointment we went over my diagnostic impression, as well as the proposed treatment plan.  According to the patient pain is worse in her right knee. She is status post surgery 10 years ago. She has numbness and weakness. She has had steroid injections which were effective. She amits that she needs a replacement however declines at this time. She has completed physical therapy for approximately 4 weeks: Twice weekly in home.   Her second worst area of pain is in her neck. She admits the left is greater than the right. She does have symptoms that radiate into her upper arms. She has some weakness. He denies any previous surgeries. She had had injections in the past at [facility]. She has  also had physical therapy but does not feel it was effective. She admits that she was  dismissed from practice secondary to missing too many appointments.  Her third area of pain is in her lower back. The right is greater than the left. She denies any radicular symptoms. She denies any previous surgery, interventions or physical therapy.  She admits that she suffered a fall and is status post hip and femur repair. She continues to have pain in this area. She denies any interventional therapy has had physical therapy after surgery.  In considering the treatment plan options, [name] was reminded that I no longer take patients for medication management only. I asked her to let me know if she had no intention of taking advantage of the interventional therapies, so that we could make arrangements to provide this space to someone interested. I also made it clear that undergoing interventional therapies for the purpose of getting pain medications is very inappropriate on the part of a patient, and it will not be tolerated in this practice. This type of behavior would suggest true addiction and therefore it requires referral to an addiction specialist.   Topics covered today: the appropriate use of the pain scale, opioid tolerance, [name]'s primary cause of pain, the results of her recent test(s), the significance of each one oth the test(s) anomalies and it's corresponding characteristic pain pattern(s), the treatment plan, treatment alternatives, the risks and possible complications of proposed treatment and medication side effects.  Further details on both, my assessment(s), as well as the proposed treatment plan, please see below.  Controlled Substance Pharmacotherapy Assessment REMS (Risk Evaluation and Mitigation Strategy)  Analgesic: Hydrocodone/acetaminophen 7.5/325 milligrams 3 times daily (hydrocodone 22.5 mg  per day) Highest recorded MME/day: 54 mg/day MME/day:22.5 mg/day Pill Count:  None expected due to no prior prescriptions written by our practice. No notes on file Pharmacokinetics: Liberation and absorption (onset of action): WNL Distribution (time to peak effect): WNL Metabolism and excretion (duration of action): WNL         Pharmacodynamics: Desired effects: Analgesia: Ms. Rookstool reports >50% benefit. Functional ability: Patient reports that medication allows her to accomplish basic ADLs Clinically meaningful improvement in function (CMIF): Sustained CMIF goals met Perceived effectiveness: Described as relatively effective, allowing for increase in activities of daily living (ADL) Undesirable effects: Side-effects or Adverse reactions: None reported Monitoring: [place] PMP: Online review of the past 61-monthperiod previously conducted. Not applicable at this point since we have not taken over the patient's medication management yet. List of all Serum Drug Screening Test(s):  No results found for: AMPHSCRSER, BARBSCRSER, BENZOSCRSER, COCAINSCRSER, PCPSCRSER, THCSCRSER, OPIATESCRSER, OOzan PGlen EllynList of all UDS test(s) done:  Lab Results  Component Value Date   SUMMARY FINAL 11/07/2016   Last UDS on record: Summary  Date Value Ref Range Status  11/07/2016 FINAL  Final    Comment:    ==================================================================== TOXASSURE COMP DRUG ANALYSIS,UR ==================================================================== Test                             Result       Flag       Units Drug Present and Declared for Prescription Verification   Alprazolam                     95           EXPECTED   ng/mg creat   Alpha-hydroxyalprazolam        71           EXPECTED  ng/mg creat    Source of alprazolam is a scheduled prescription medication.    Alpha-hydroxyalprazolam is an expected metabolite of alprazolam.   Hydrocodone                    946          EXPECTED   ng/mg creat   Hydromorphone                  53            EXPECTED   ng/mg creat   Dihydrocodeine                 75           EXPECTED   ng/mg creat   Norhydrocodone                 1955         EXPECTED   ng/mg creat    Sources of hydrocodone include scheduled prescription    medications. Hydromorphone, dihydrocodeine and norhydrocodone are    expected metabolites of hydrocodone. Hydromorphone and    dihydrocodeine are also available as scheduled prescription    medications.   Oxycodone                      182          EXPECTED   ng/mg creat   Noroxycodone                   1913         EXPECTED   ng/mg creat    Sources of oxycodone include scheduled prescription medications.    Noroxycodone is an expected metabolite of oxycodone.   Gabapentin                     PRESENT      EXPECTED   Methocarbamol                  PRESENT      EXPECTED   Citalopram                     PRESENT      EXPECTED   Desmethylcitalopram            PRESENT      EXPECTED    Desmethylcitalopram is an expected metabolite of citalopram or    the enantiomeric form, escitalopram.   Trazodone                      PRESENT      EXPECTED   1,3 chlorophenyl piperazine    PRESENT      EXPECTED    1,3-chlorophenyl piperazine is an expected metabolite of    trazodone.   Acetaminophen                  PRESENT      EXPECTED Drug Present not Declared for Prescription Verification   Cyclobenzaprine                PRESENT      UNEXPECTED Drug Absent but Declared for Prescription Verification   Baclofen                       Not Detected UNEXPECTED   Lidocaine  Not Detected UNEXPECTED    Lidocaine, as indicated in the declared medication list, is not    always detected even when used as directed. ==================================================================== Test                      Result    Flag   Units      Ref Range   Creatinine              112              mg/dL       >=20 ==================================================================== Declared Medications:  The flagging and interpretation on this report are based on the  following declared medications.  Unexpected results may arise from  inaccuracies in the declared medications.  **Note: The testing scope of this panel includes these medications:  Alprazolam  Baclofen  Citalopram (Celexa)  Gabapentin  Hydrocodone (Hydrocodone-Acetaminophen)  Methocarbamol  Oxycodone (Oxycodone Acetaminophen)  Trazodone  **Note: The testing scope of this panel does not include small to  moderate amounts of these reported medications:  Acetaminophen  Acetaminophen (Hydrocodone-Acetaminophen)  Acetaminophen (Oxycodone Acetaminophen)  Lidocaine  **Note: The testing scope of this panel does not include following  reported medications:  Albuterol  Amlodipine  Buspirone  Ciprofloxacin  Cyanocobalamin  Docusate  Enoxaparin  Iron  Lovastatin  Meloxicam  Methylprednisolone  Naloxegol (Movantik)  Omeprazole (Nexium)  Ondansetron  Polyethylene Glycol  Sennosides  Tamsulosin ==================================================================== For clinical consultation, please call [phone]. ====================================================================    UDS interpretation: No unexpected findings.          Medication Assessment Form: Patient introduced to form today Treatment compliance: Treatment may start today if patient agrees with proposed plan. Evaluation of compliance is not applicable at this point Risk Assessment Profile: Aberrant behavior: See initial evaluations. None observed or detected today Comorbid factors increasing risk of overdose: See initial evaluation. No additional risks detected today Medical Psychology Evaluation: Please see scanned results in medical record.     Opioid Risk Tool - 02/12/17 0901      Family History of Substance Abuse   Alcohol Negative    Illegal Drugs Negative   Rx Drugs Negative     Personal History of Substance Abuse   Alcohol Negative   Illegal Drugs Negative   Rx Drugs Negative     Age   Age between 58-45 years  No     History of Preadolescent Sexual Abuse   History of Preadolescent Sexual Abuse Negative or Female     Psychological Disease   Psychological Disease Negative   Depression Positive     Total Score   Opioid Risk Tool Scoring 1   Opioid Risk Interpretation Low Risk     ORT Scoring interpretation table:  Score <3 = Low Risk for SUD  Score between 4-7 = Moderate Risk for SUD  Score >8 = High Risk for Opioid Abuse   Risk Mitigation Strategies:  Patient opioid safety counseling: Completed today. Counseling provided to patient as per "Patient Counseling Document". Document signed by patient, attesting to counseling and understanding Patient-Prescriber Agreement (PPA): Obtained today.  Controlled substance notification to other providers: Written and sent today.  Pharmacologic Plan: Today we may be taking over the patient's pharmacological regimen. See below             Laboratory Chemistry  Inflammation Markers (CRP: Acute Phase) (ESR: Chronic Phase) Lab Results  Component Value Date   CRP <0.8 11/19/2016   ESRSEDRATE 20 11/19/2016  Renal Function Markers Lab Results  Component Value Date   BUN 7 (L) 12/04/2016   CREATININE 1.09 (H) 12/04/2016   GFRAA 57 (L) 12/04/2016   GFRNONAA 50 (L) 12/04/2016                 Hepatic Function Markers Lab Results  Component Value Date   AST 19 12/04/2016   ALT 15 12/04/2016   ALBUMIN 4.0 12/04/2016   ALKPHOS 104 12/04/2016                 Electrolytes Lab Results  Component Value Date   NA 145 (H) 12/04/2016   K 4.0 12/04/2016   CL 107 (H) 12/04/2016   CALCIUM 9.4 12/04/2016   MG 2.0 11/19/2016                 Neuropathy Markers Lab Results  Component Value Date   VITAMINB12 1,888 (H) 11/19/2016                  Bone Pathology Markers Lab Results  Component Value Date   ALKPHOS 104 12/04/2016   25OHVITD1 37 11/19/2016   25OHVITD2 <1.0 11/19/2016   25OHVITD3 37 11/19/2016   CALCIUM 9.4 12/04/2016                 Coagulation Parameters Lab Results  Component Value Date   INR 1.00 09/13/2016   LABPROT 13.2 09/13/2016   APTT 34 01/04/2011   PLT 263 12/04/2016                 Cardiovascular Markers Lab Results  Component Value Date   BNP 60.2 01/23/2016   HGB 14.7 12/04/2016   HCT 44.8 12/04/2016                 Note: Lab results reviewed.  Recent Diagnostic Imaging Review  Cervical Imaging: Cervical MR wo contrast:  Results for orders placed during the hospital encounter of 10/07/14  MR Cervical Spine Wo Contrast   Narrative CLINICAL DATA:  Known cervical spine fracture, new onset fever. Here for assessment of sepsis.  EXAM: MRI CERVICAL AND LUMBAR SPINE WITHOUT CONTRAST  TECHNIQUE: Multiplanar and multiecho pulse sequences of the cervical spine, to include the craniocervical junction and cervicothoracic junction, and thoracic spine, were obtained without intravenous contrast. Please note, motion degraded examination. Per technologist note, patient did not wish to receive intravenous contrast due to pain, examination is noncontrast.  COMPARISON:  CT of the cervical spine Oct 05, 2014  FINDINGS: MRI CERVICAL SPINE FINDINGS  Motion degraded examination.  Moderate C7 compression fracture, low T1 and bright T2 signal with 30-50% height loss. No retropulsed bony fragments. Remaining cervical vertebral bodies appear intact. Maintenance of cervical lordosis. Intervertebral discs demonstrate normal morphology, decreased T2 signal within all cervical disc most consistent with desiccation.  Low signal measuring up to 6 mm in AP dimension posterior to the odontoid process consistent with pannus, can be seen with CPPD without erosions or edema. Cervical spinal cord appears  normal morphology and signal characteristics though, motion degrades sensitivity for subtle cord signal abnormality. No syrinx. Included prevertebral and paraspinal soft tissues are normal.  Level by level evaluation:  C2-3: Moderate facet arthropathy. No disc bulge, canal stenosis or neural foraminal narrowing.  C3-4: Annular bulging, uncovertebral hypertrophy. Moderate LEFT greater than RIGHT facet arthropathy. Very mild canal stenosis. Mild LEFT neural foraminal narrowing.  C4-5: Small broad-based disc bulge, mild to moderate facet arthropathy and uncovertebral hypertrophy. Moderate canal stenosis. Moderate to  severe LEFT greater than RIGHT neural foraminal narrowing.  C5-6: Small broad-based disc bulge, uncovertebral hypertrophy and moderate facet arthropathy. Mild canal stenosis. Moderate RIGHT, moderate to severe LEFT neural foraminal narrowing.  C6-7: Severely limited by motion and habitus. No definite disc bulge. At least moderate facet arthropathy without canal stenosis or neural foraminal narrowing.  C7-T1: No definite disc bulge, canal stenosis. At least mild facet arthropathy without neural foraminal narrowing.  MRI THORACIC SPINE FINDINGS  Moderate acute appearing T2 compression fracture with 30-50% height loss. Mild acute appearing T3 compression fracture. The remaining thoracic vertebral bodies appear intact. Maintenance of thoracic kyphosis. Intervertebral discs demonstrate relatively preserved morphology and signal characteristics. Minimal subacute to chronic discogenic endplate change of lower thoracic spine. 13 mm T6 hemangioma. Scattered chronic Schmorl's nodes.  Thoracic spinal cord appears normal in morphology and signal characteristics of the level the conus medullaris which terminates at T12-L1. Motion decreases sensitivity for potential cord signal abnormality.  No significant disc bulge, canal stenosis or neural foraminal narrowing any thoracic  level.  IMPRESSION: Please note, examination was ordered with contrast though, after noncontrast portion, patient was unable to tolerate imaging due to pain and contrast-enhanced sequences not performed.  MRI CERVICAL SPINE: Acute moderate C7 compression fracture. No malalignment.  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 Multilevel neural foraminal narrowing: Moderate to severe on the LEFT at C4-5 and C5-6.  MRI THORACIC SPINE: Acute moderate T2 compression fracture. Mild acute T3 compression fracture. No malalignment.   Electronically Signed   By: Elon Alas   On: 10/08/2014 04:30    Cervical CT wo contrast:  Results for orders placed during the hospital encounter of 09/13/16  CT Cervical Spine Wo Contrast   Narrative CLINICAL DATA:  Fall.  EXAM: CT HEAD WITHOUT CONTRAST  CT CERVICAL SPINE WITHOUT CONTRAST  TECHNIQUE: Multidetector CT imaging of the head and cervical spine was performed following the standard protocol without intravenous contrast. Multiplanar CT image reconstructions of the cervical spine were also generated.  COMPARISON:  CT head 07/02/2016. CT head and cervical spine 03/26/2016  FINDINGS: CT HEAD FINDINGS  Brain: No evidence of acute infarction, hemorrhage, hydrocephalus, extra-axial collection or mass lesion/mass effect. Diffuse cerebral atrophy. Ventricular dilatation consistent with central atrophy. Low-attenuation changes in the deep white matter consistent small vessel ischemia. Calcification in the basal ganglia.  Vascular: No hyperdense vessel or unexpected calcification.  Skull: Normal. Negative for fracture or focal lesion.  Sinuses/Orbits: No acute finding.  Other: No significant change since previous study.  CT CERVICAL SPINE FINDINGS  Alignment: Normal alignment of the cervical spine and facet joints.  Skull base and vertebrae: Skullbase appears intact.  Endplate compression deformities at C7 and T2 without change since prior study. No focal bone lesion or bone destruction.  Soft tissues and spinal canal: No prevertebral fluid or swelling. No visible canal hematoma.  Disc levels: Disc space heights are preserved. Degenerative changes in the cervical facet joints and at C1 -2.  Upper chest: Visualized lung apices are clear.  Other: Degenerative changes in the temporomandibular joints.  IMPRESSION: No acute intracranial abnormalities. Chronic atrophy and small vessel ischemic changes.  Normal alignment of the cervical spine. Chronic endplate compression deformities at C7 and T2 unchanged since previous study, likely representing osteoporosis. Mild degenerative changes. No acute displaced fractures identified.   Electronically Signed   By: Lucienne Capers M.D.   On: 09/13/2016 04:23    Cervical DG 2-3 views:  Results for orders  placed during the hospital encounter of 09/28/14  DG Cervical Spine 2 or 3 views   Narrative CLINICAL DATA:  Posterior neck pain, status post fall 4 days ago  EXAM: CERVICAL SPINE - 2-3 VIEW  COMPARISON:  Cervical spine CT scan of September 22, 2014  FINDINGS: The cervical vertebral bodies are preserved in height. The disc space heights are well maintained. There is mild multi level facet joint hypertrophy. There is degenerative change of the atlanto-dens articulation. The prevertebral soft tissue spaces are normal.  IMPRESSION: There is no acute bony abnormality of the cervical spine.   Electronically Signed   By: David  Martinique M.D.   On: 09/28/2014 15:49    Shoulder Imaging: Shoulder-L DG:  Results for orders placed during the hospital encounter of 03/26/16  DG Shoulder Left   Narrative CLINICAL DATA:  Syncopal episode, falling onto the left shoulder was subsequent pain.  EXAM: LEFT SHOULDER - 2+ VIEW  COMPARISON:  04/24/2015  FINDINGS: Humeral head is properly located. No  evidence of regional fracture. No degenerative change. Normal humeral acromial distance.  IMPRESSION: Negative radiographs.   Electronically Signed   By: Nelson Chimes M.D.   On: 03/26/2016 15:26    Thoracic Imaging: Thoracic MR wo contrast:  Results for orders placed during the hospital encounter of 10/07/14  MR Thoracic Spine Wo Contrast   Narrative CLINICAL DATA:  Known cervical spine fracture, new onset fever. Here for assessment of sepsis.  EXAM: MRI CERVICAL AND LUMBAR SPINE WITHOUT CONTRAST  TECHNIQUE: Multiplanar and multiecho pulse sequences of the cervical spine, to include the craniocervical junction and cervicothoracic junction, and thoracic spine, were obtained without intravenous contrast. Please note, motion degraded examination. Per technologist note, patient did not wish to receive intravenous contrast due to pain, examination is noncontrast.  COMPARISON:  CT of the cervical spine Oct 05, 2014  FINDINGS: MRI CERVICAL SPINE FINDINGS  Motion degraded examination.  Moderate C7 compression fracture, low T1 and bright T2 signal with 30-50% height loss. No retropulsed bony fragments. Remaining cervical vertebral bodies appear intact. Maintenance of cervical lordosis. Intervertebral discs demonstrate normal morphology, decreased T2 signal within all cervical disc most consistent with desiccation.  Low signal measuring up to 6 mm in AP dimension posterior to the odontoid process consistent with pannus, can be seen with CPPD without erosions or edema. Cervical spinal cord appears normal morphology and signal characteristics though, motion degrades sensitivity for subtle cord signal abnormality. No syrinx. Included prevertebral and paraspinal soft tissues are normal.  Level by level evaluation:  C2-3: Moderate facet arthropathy. No disc bulge, canal stenosis or neural foraminal narrowing.  C3-4: Annular bulging, uncovertebral hypertrophy. Moderate  LEFT greater than RIGHT facet arthropathy. Very mild canal stenosis. Mild LEFT neural foraminal narrowing.  C4-5: Small broad-based disc bulge, mild to moderate facet arthropathy and uncovertebral hypertrophy. Moderate canal stenosis. Moderate to severe LEFT greater than RIGHT neural foraminal narrowing.  C5-6: Small broad-based disc bulge, uncovertebral hypertrophy and moderate facet arthropathy. Mild canal stenosis. Moderate RIGHT, moderate to severe LEFT neural foraminal narrowing.  C6-7: Severely limited by motion and habitus. No definite disc bulge. At least moderate facet arthropathy without canal stenosis or neural foraminal narrowing.  C7-T1: No definite disc bulge, canal stenosis. At least mild facet arthropathy without neural foraminal narrowing.  MRI THORACIC SPINE FINDINGS  Moderate acute appearing T2 compression fracture with 30-50% height loss. Mild acute appearing T3 compression fracture. The remaining thoracic vertebral bodies appear intact. Maintenance of thoracic kyphosis. Intervertebral discs demonstrate relatively  preserved morphology and signal characteristics. Minimal subacute to chronic discogenic endplate change of lower thoracic spine. 13 mm T6 hemangioma. Scattered chronic Schmorl's nodes.  Thoracic spinal cord appears normal in morphology and signal characteristics of the level the conus medullaris which terminates at T12-L1. Motion decreases sensitivity for potential cord signal abnormality.  No significant disc bulge, canal stenosis or neural foraminal narrowing any thoracic level.  IMPRESSION: Please note, examination was ordered with contrast though, after noncontrast portion, patient was unable to tolerate imaging due to pain and contrast-enhanced sequences not performed.  MRI CERVICAL SPINE: Acute moderate C7 compression fracture. No malalignment.  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  Multilevel neural foraminal narrowing: Moderate to severe on the LEFT at C4-5 and C5-6.  MRI THORACIC SPINE: Acute moderate T2 compression fracture. Mild acute T3 compression fracture. No malalignment.   Electronically Signed   By: Elon Alas   On: 10/08/2014 04:30    Thoracic CT wo contrast:  Results for orders placed during the hospital encounter of 02/15/15  CT Thoracic Spine Wo Contrast   Narrative CLINICAL DATA:  [place] out of bed onto hardwood floors, head neck pain, recent upper thoracic fracture, new fall  EXAM: CT THORACIC AND LUMBAR SPINE WITHOUT CONTRAST  TECHNIQUE: Multidetector CT imaging of the thoracic and lumbar spine was performed without contrast. Multiplanar CT image reconstructions were also generated.  COMPARISON:  CT thoracic spine 01/26/2015, lumbar spine radiographs 09/22/2014, CT abdomen and pelvis 10/17/2012  FINDINGS: CT THORACIC SPINE FINDINGS  Diffuse osseous demineralization.  Scattered atherosclerotic calcifications with ascending thoracic aorta 3.9 cm transverse image 42.  Additional atherosclerotic calcifications at coronary arteries and proximal great vessels.  No thoracic adenopathy.  Calcified granuloma LEFT upper lobe image 19.  Anterior compression fracture of T2 unchanged, involving both superior and inferior endplates.  Superior endplate compression fracture T3 unchanged.  Minimal concavity superior endplate T4 stable.  No new fracture, subluxation or bone destruction.  Visualized portions of ribs and scapulae unremarkable.  CT LUMBAR SPINE FINDINGS  Small calcified renal artery aneurysms bilaterally 10 mm RIGHT hand 8 mm LEFT.  Atherosclerotic calcifications aorta.  LEFT adrenal nodule 14 x 10 mm stable since 10/17/2012.  Based on numbering of ribs and thoracic spine, only 4 lumbar type vertebral bodies are identified.  No lumbar fracture or subluxation.  Mild scattered degenerative disc disease  changes.  Inferior endplate concavities at L2, L3 and L4 are stable from prior radiographs.  SI joints symmetric.  Diffusely bulging discs at L1-L2, L2-L3, and L3-L4.  Significant multifactorial spinal stenosis at L2-L3.  IMPRESSION: CT THORACIC SPINE IMPRESSION  Stable compression fractures at T2 and T3.  Stable superior endplate concavity T4.  Osseous demineralization with scattered degenerative disc disease changes.  No acute abnormalities.  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  Recommend annual imaging followup by CTA or MRA. This recommendation follows 2010 ACCF/AHA/AATS/ACR/ASA/SCA/SCAI/SIR/STS/SVM Guidelines for the Diagnosis and Management of Patients with Thoracic Aortic Disease. Circulation.2010; 121: E675-Q492  CT LUMBAR SPINE IMPRESSION  No acute lumbar spine abnormalities.  Stable inferior endplate concavities at L2, L3, and L4.  Bulging discs at multiple levels with significant multifactorial spinal stenosis likely present at L2-L3.  Small BILATERAL calcified renal artery aneurysms.   Electronically Signed   By: Lavonia Dana M.D.   On: 02/15/2015 20:46    Thoracic DG 2-3 views:  Results for orders placed during the hospital encounter of 05/22/15  [facility] Thoracic Spine 2  View   Narrative CLINICAL DATA:  76 year old female with acute on chronic back pain after falling 5 days previously. Pain localizes to the thoracic spine.  EXAM: THORACIC SPINE 2 VIEWS  COMPARISON:  Prior CT scan of the thoracic spine 02/15/2015  FINDINGS: There is no evidence of thoracic spine fracture. Alignment is normal. No other significant bone abnormalities are identified. Atherosclerotic vascular calcifications present in both renal arteries proximally.  IMPRESSION: Negative.   Electronically Signed   By: Jacqulynn Cadet M.D.   On: 05/22/2015 16:41    Thoracic DG w/swimmers view:  Results for orders placed during the  hospital encounter of 03/29/16  DG Thoracic Spine W/Swimmers   Narrative CLINICAL DATA:  [place] 3 days ago, mid back pain, history osteoporosis, osteoarthritis  EXAM: THORACIC SPINE - 3 VIEWS  COMPARISON:  Chest radiograph 01/23/2016  FINDINGS: Osseous demineralization.  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  Minimal biconvex scoliosis.  Mild chronic superior endplate height loss of T2 vertebral body.  Minimal superior endplate concavities at T3 and T4, also chronic.  No acute fracture, subluxation, or bone destruction.  IMPRESSION: Osseous mineralization.  Chronic superior endplate changes at R7-E0 as above.  No acute abnormalities.   Electronically Signed   By: Lavonia Dana M.D.   On: 03/29/2016 12:16    Lumbosacral Imaging: Lumbar MR wo contrast:  Results for orders placed during the hospital encounter of 03/28/14  MR Lumbar Spine Wo Contrast   Narrative CLINICAL DATA:  Low back pain, 4 years duration. Symptoms are worsening since a fall in a retail store 1 year ago. Pain in weakness in both legs.  EXAM: MRI LUMBAR SPINE WITHOUT CONTRAST  TECHNIQUE: Multiplanar, multisequence MR imaging of the lumbar spine was performed. No intravenous contrast was administered.  COMPARISON:  CT 10/17/2012.  Radiography 09/17/2012.  FINDINGS: There are minimal, non-compressive disc bulges at T12-L1, L1-2 and L2-3. No stenosis. The distal cord and conus are normal with the conus tip at upper L2.  L3-4: Disc degeneration with shallow broad-based protrusion of disc material. Bilateral facet degeneration with facet and ligamentous hypertrophy. Stenosis of both lateral recesses and foramina, right more than left. Neural compression could occur at this level. Additionally, based on the facet arthropathy, there could be motion with flexion and extension.  L4-5: Mild bulging of the disc. Mild bilateral facet arthropathy. No compressive narrowing of the canal or  foramina.  L5-S1: Mild bulging of the disc. Mild facet degeneration. No stenosis.  IMPRESSION: The significant abnormalities are probably at the L3-4 level. There is bilateral facet arthropathy which could be a cause of back pain or referred facet syndrome pain. There is shallow broad-based protrusion of disc material. There is stenosis of the lateral recesses and neural foramina right more than left. Neural compression could occur at this level. Additionally, there could be worsening of this pattern with flexion extension.   Electronically Signed   By: Nelson Chimes M.D.   On: 03/28/2014 16:52    Lumbar CT wo contrast:  Results for orders placed during the hospital encounter of 02/15/15  CT Lumbar Spine Wo Contrast   Narrative CLINICAL DATA:  [place] out of bed onto hardwood floors, head neck pain, recent upper thoracic fracture, new fall  EXAM: CT THORACIC AND LUMBAR SPINE WITHOUT CONTRAST  TECHNIQUE: Multidetector CT imaging of the thoracic and lumbar spine was performed without contrast. Multiplanar CT image reconstructions were also generated.  COMPARISON:  CT thoracic spine 01/26/2015, lumbar spine radiographs 09/22/2014, CT abdomen and pelvis  10/17/2012  FINDINGS: CT THORACIC SPINE FINDINGS  Diffuse osseous demineralization.  Scattered atherosclerotic calcifications with ascending thoracic aorta 3.9 cm transverse image 42.  Additional atherosclerotic calcifications at coronary arteries and proximal great vessels.  No thoracic adenopathy.  Calcified granuloma LEFT upper lobe image 19.  Anterior compression fracture of T2 unchanged, involving both superior and inferior endplates.  Superior endplate compression fracture T3 unchanged.  Minimal concavity superior endplate T4 stable.  No new fracture, subluxation or bone destruction.  Visualized portions of ribs and scapulae unremarkable.  CT LUMBAR SPINE FINDINGS  Small calcified renal artery aneurysms  bilaterally 10 mm RIGHT hand 8 mm LEFT.  Atherosclerotic calcifications aorta.  LEFT adrenal nodule 14 x 10 mm stable since 10/17/2012.  Based on numbering of ribs and thoracic spine, only 4 lumbar type vertebral bodies are identified.  No lumbar fracture or subluxation.  Mild scattered degenerative disc disease changes.  Inferior endplate concavities at L2, L3 and L4 are stable from prior radiographs.  SI joints symmetric.  Diffusely bulging discs at L1-L2, L2-L3, and L3-L4.  Significant multifactorial spinal stenosis at L2-L3.  IMPRESSION: CT THORACIC SPINE IMPRESSION  Stable compression fractures at T2 and T3.  Stable superior endplate concavity T4.  Osseous demineralization with scattered degenerative disc disease changes.  No acute abnormalities.  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  Recommend annual imaging followup by CTA or MRA. This recommendation follows 2010 ACCF/AHA/AATS/ACR/ASA/SCA/SCAI/SIR/STS/SVM Guidelines for the Diagnosis and Management of Patients with Thoracic Aortic Disease. Circulation.2010; 121: K938-H829  CT LUMBAR SPINE IMPRESSION  No acute lumbar spine abnormalities.  Stable inferior endplate concavities at L2, L3, and L4.  Bulging discs at multiple levels with significant multifactorial spinal stenosis likely present at L2-L3.  Small BILATERAL calcified renal artery aneurysms.   Electronically Signed   By: Lavonia Dana M.D.   On: 02/15/2015 20:46    Lumbar DG (Complete) 4+V:  Results for orders placed during the hospital encounter of 11/19/16  DG Lumbar Spine Complete   Narrative CLINICAL DATA:  Low back pain for many years.  No specific injury.  EXAM: LUMBAR SPINE - COMPLETE 4+ VIEW  COMPARISON:  CT, 10/23/2016  FINDINGS: There are fractures of posterior twelfth ribs bilaterally, adjacent to the costovertebral junction. These may be recent. These were not evident on the prior  chest CT.  No other fractures. All lumbar vertebral bodies normal in height. Lumbar disc spaces are well maintained. No significant degenerative change. Facet joints are well maintained.  Bones are demineralized. There are scattered inferior abdominal aortic vascular calcifications. Soft tissues otherwise unremarkable.  IMPRESSION: 1. Apparent fractures of the twelfth ribs bilaterally adjacent to the costovertebral joints. These could be from the fall reported few months ago. 2. No other evidence of a fracture or acute abnormality.   Electronically Signed   By: Lajean Manes M.D.   On: 11/19/2016 16:46    Hip Imaging: Hip-R MR wo contrast:  Results for orders placed during the hospital encounter of 01/01/17  MR HIP RIGHT WO CONTRAST   Narrative CLINICAL DATA:  Patient fell on a prolonged broke right hip. Right hip pain and groin pain with limited range of motion.  EXAM: MR OF THE RIGHT HIP WITHOUT CONTRAST  TECHNIQUE: Multiplanar, multisequence MR imaging was performed. No intravenous contrast was administered.  COMPARISON:  CT from 12/05/2016  FINDINGS: Bones: Susceptibility artifact from right femoral nail fixation across an intratrochanteric fracture of the right femur is identified. Detail of the fracture, better visualized on  the recent CT is compromised as result. There appears to be marrow edema of the weight-bearing portion of the femoral head. Curvilinear hypointensity of the subcortical right femoral head raises the possibility of avascular necrosis, series 2, image 20. Sub chondral degenerative cyst of the acetabular roof is seen on the right.  Articular cartilage and labrum  Articular cartilage: Given joint space narrowing of both hips, and there is likely moderate thinning 10 of the acetabular and femoral head cartilage bilaterally.  Labrum: No definite labral tear though assessment is limited by lack of joint fluid.  Joint or bursal  effusion  Joint effusion:  No joint effusion  Bursae:  No bursal fluid collections  Muscles and tendons  Muscles and tendons: Mild edema of the right gluteus muscles possibly representing stigmata of mild strain or tendinopathy.  Other findings  Miscellaneous: The included SI joints are nonacute. The pubic symphysis is maintained. No sacral insufficiency fracture is identified. The patient is status post hysterectomy. No adnexal mass is noted.  IMPRESSION: 1. Limited study due to pre-existing right femoral nail fixation causing susceptibility artifacts through a known intertrochanteric fracture of the right femur. 2. Despite susceptibility artifacts, there does appear to be some evidence of marrow edema along the weight-bearing portion of the right femoral head. A curvilinear hypointensity seen along involving the subcortical bone and the possibility of AVN is not excluded. 3. Marrow edema adjacent to the greater trochanter likely representing mild gluteal muscle strain.   Electronically Signed   By: Ashley Royalty M.D.   On: 01/01/2017 23:11    Hip-R CT wo contrast:  Results for orders placed during the hospital encounter of 12/05/16  CT HIP RIGHT WO CONTRAST   Narrative CLINICAL DATA:  Status post fall.  Right hip pain.  EXAM: CT OF THE RIGHT HIP WITHOUT CONTRAST  TECHNIQUE: Multidetector CT imaging of the right hip was performed according to the standard protocol. Multiplanar CT image reconstructions were also generated.  COMPARISON:  None  FINDINGS: Bones/Joint/Cartilage  Generalized osteopenia. Prior right hip ORIF with a intramedullary nail and interlocking femoral neck screw in satisfactory position without hardware failure or complication.  Acute nondisplaced right intertrochanteric fracture. Medial fracture line involves the superior most aspect of the lesser trochanter.  No other fracture or dislocation. Normal alignment. No  joint effusion.  Ligaments  Ligaments are suboptimally evaluated by CT.  Muscles and Tendons Muscles are normal. No muscle atrophy. No intramuscular fluid collection or hematoma.  Soft tissue No fluid collection or hematoma.  No soft tissue mass.  IMPRESSION: 1. Acute nondisplaced right intertrochanteric fracture with the medial fracture line involving the superior most aspect of the lesser trochanter.   Electronically Signed   By: Kathreen Devoid   On: 12/05/2016 11:50    Hip-R DG 2-3 views:  Results for orders placed during the hospital encounter of 11/19/16  DG HIP UNILAT W OR W/O PELVIS 2-3 VIEWS RIGHT   Narrative CLINICAL DATA:  Patient reports she fell and broke right hip x2 months ago. Reports no relief of pain since operation.  EXAM: DG HIP (WITH OR WITHOUT PELVIS) 2-3V RIGHT  COMPARISON:  10/22/2016  FINDINGS: No acute fracture. Orthopedic hardware including a short right proximal femur intramedullary rod supporting 2 screws appears well-seated with no evidence of loosening. There is axial right hip joint space narrowing, stable from the prior study.  The SI joints, symphysis pubis and left hip joint are normally spaced and aligned.  Bones are demineralized.  Soft tissues are  unremarkable.  IMPRESSION: 1. No fracture or dislocation. 2. No evidence of loosening of the right proximal femur ORIF hardware.   Electronically Signed   By: Lajean Manes M.D.   On: 11/19/2016 16:49    Hip-L DG 2-3 views:  Results for orders placed during the hospital encounter of 03/26/16  DG Hip Unilat With Pelvis 2-3 Views Left   Narrative CLINICAL DATA:  Syncopal episode falling onto the left side with left-sided pain.  EXAM: DG HIP (WITH OR WITHOUT PELVIS) 2-3V LEFT  COMPARISON:  None.  FINDINGS: No pelvic fracture. No hip fracture. No significant degenerative change.  IMPRESSION: Negative radiographs.   Electronically Signed   By: Nelson Chimes M.D.    On: 03/26/2016 15:27    Knee Imaging: Knee-L MR w contrast:  Results for orders placed during the hospital encounter of 09/04/16  MR KNEE LEFT WO CONTRAST   Narrative CLINICAL DATA:  Feels like the knee is popping out.  Pain.  EXAM: MRI OF THE LEFT KNEE WITHOUT CONTRAST  TECHNIQUE: Multiplanar, multisequence MR imaging of the knee was performed. No intravenous contrast was administered.  COMPARISON:  None.  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 No obvious periarticular fluid collection. No osteolysis. No acute fracture or dislocation. Overall alignment is anatomic. No Baker cyst.  Ligaments  Collateral ligaments are obscured by susceptibility artifact.  Muscles and Tendons No muscle signal abnormality. No muscle atrophy or edema. No intramuscular fluid collection or hematoma. Patellar tendon and quadriceps tendon are intact.  Soft tissue No fluid collection or hematoma. No soft tissue mass. Popliteal fossa is normal. Normal neurovascular bundles.  IMPRESSION: 1. 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 No evidence of hardware failure or complication. 2. No Baker cyst.   Electronically Signed   By: Kathreen Devoid   On: 09/04/2016 13:51    Knee-R MR wo contrast:  Results for orders placed during the hospital encounter of 01/01/17  MR KNEE RIGHT WO CONTRAST   Narrative CLINICAL DATA:  Pain after fall in April. Right knee pain and swelling.  EXAM: MRI OF THE RIGHT KNEE WITHOUT CONTRAST  TECHNIQUE: Multiplanar, multisequence MR imaging of the knee was performed. No intravenous contrast was administered.  COMPARISON:  11/19/2016  FINDINGS: Markedly compromised study due to susceptibility artifacts from an indwelling long-stem semi constrained knee  arthroplasty.  Imaging was performed in an attempt to identify any potentially occult periprosthetic fracture. No such fracture or edema is identified. No significant joint effusion. Generalized muscle atrophy is seen.  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 No occult fracture about the femoral or tibial stem are identified. There is generalized muscle atrophy about the knee. No significant fluid collections are apparent.   Electronically Signed   By: Ashley Royalty M.D.   On: 01/01/2017 23:14    Knee-R DG 1-2 views:  Results for orders placed during the hospital encounter of 11/19/16  DG Knee 1-2 Views Right   Narrative CLINICAL DATA:  Patient reports right knee pain that is worse when attempting to flex knee. Patient has had multiple right knee surgeries. No new or recent injuries.  EXAM: RIGHT KNEE - 1-2 VIEW  COMPARISON:  09/27/2016  FINDINGS: No fracture or bone lesion.  The femoral and tibial prosthetic components are well-seated with no evidence of loosening.  No joint effusion.  Soft tissues are unremarkable.  Bones are  demineralized.  IMPRESSION: 1. No fracture, bone lesion or evidence of loosening of the orthopedic hardware.   Electronically Signed   By: Lajean Manes M.D.   On: 11/19/2016 16:47    Knee-R DG 4 views:  Results for orders placed during the hospital encounter of 09/27/16  DG Knee Complete 4 Views Right   Narrative CLINICAL DATA:  76 year old female with a history of right knee pain. Prior fracture  EXAM: RIGHT KNEE - COMPLETE 4+ VIEW  COMPARISON:  09/13/2016  FINDINGS: Surgical changes of right knee arthroplasty. No evidence of perihardware fracture. No joint effusion. No focal soft tissue swelling. No displaced fracture of the visualized femur or proximal lower extremity.  IMPRESSION: No acute bony abnormality.  Surgical changes of prior knee arthroplasty.   Electronically Signed   By:  Corrie Mckusick D.O.   On: 09/27/2016 13:32    Knee-L DG 4 views:  Results for orders placed during the hospital encounter of 11/14/07  DG Knee Complete 4 Views Left   Narrative Clinical Data: 76 year old female status post knee dislocation. Pain.   LEFT KNEE - COMPLETE 4+ VIEW   Comparison: 04/29/2007.   Findings: The patient is status post left total knee arthroplasty. The knee is located.  A moderate sized joint effusion is seen. There is no evidence for acute fracture.   IMPRESSION:   1.  Moderate sized joint effusion without underlying fracture dislocation. 2.  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  Provider: Rayna Sexton   Note: Results of ordered imaging test(s) reviewed and explained to patient in Layman's terms. Copy of results provided to patient  Meds   Current Outpatient Prescriptions:  .  albuterol (PROVENTIL HFA;VENTOLIN HFA) 108 (90 BASE) MCG/ACT inhaler, Inhale 1 puff into the lungs every 6 (six) hours as needed for wheezing or shortness of breath., Disp: , Rfl:  .  albuterol (PROVENTIL) (2.5 MG/3ML) 0.083% nebulizer solution, Take 2.5 mg by nebulization every 6 (six) hours as needed for wheezing or shortness of breath., Disp: , Rfl:  .  ALPRAZolam (XANAX) 1 MG tablet, Take 1 mg by mouth at bedtime as needed. , Disp: , Rfl:  .  amLODipine (NORVASC) 5 MG tablet, Take 5 mg by mouth daily. , Disp: , Rfl:  .  baclofen (LIORESAL) 10 MG tablet, TAKE 1 TABLET BY MOUTH THREE TIMES A DAY, Disp: 30 each, Rfl: 0 .  citalopram (CELEXA) 10 MG tablet, Take 10 mg by mouth daily., Disp: , Rfl:  .  esomeprazole (NEXIUM) 40 MG capsule, Take 40 mg by mouth 2 (two) times daily before a meal. , Disp: , Rfl:  .  gabapentin (NEURONTIN) 400 MG capsule, TAKE 1 CAPSULE BY MOUTH FOUR TIMES A DAY (Patient taking differently: TAKE 1 CAPSULE BY MOUTH THREE TIMES A DAY), Disp: 120 capsule, Rfl: 0 .  lovastatin (MEVACOR) 10 MG tablet, Take 10 mg by mouth daily., Disp: , Rfl:   .  meloxicam (MOBIC) 15 MG tablet, Take 15 mg by mouth every evening., Disp: , Rfl:  .  ondansetron (ZOFRAN) 4 MG tablet, Take 4 mg by mouth every 8 (eight) hours as needed for nausea. , Disp: , Rfl:  .  polyethylene glycol powder (GLYCOLAX/MIRALAX) powder, , Disp: , Rfl:  .  PROLIA 60 MG/ML SOLN injection, , Disp: , Rfl:  .  senna (SENOKOT) 8.6 MG TABS tablet, Take 2 tablets by mouth. constipation, Disp: , Rfl:  .  traZODone (DESYREL) 100 MG tablet, Take 50 mg by mouth at bedtime. ,  Disp: , Rfl:  .  VOLTAREN 1 % GEL, Apply 2 g topically 4 (four) times daily as needed (for pain). , Disp: , Rfl:  .  oxyCODONE (OXY IR/ROXICODONE) 5 MG immediate release tablet, Take 1 tablet (5 mg total) by mouth every 8 (eight) hours as needed for severe pain., Disp: 90 tablet, Rfl: 0  ROS  Constitutional: Denies any fever or chills Gastrointestinal: No reported hemesis, hematochezia, vomiting, or acute GI distress Musculoskeletal: Denies any acute onset joint swelling, redness, loss of ROM, or weakness Neurological: No reported episodes of acute onset apraxia, aphasia, dysarthria, agnosia, amnesia, paralysis, loss of coordination, or loss of consciousness  Allergies  Ms. Guastella is allergic to ampicillin-sulbactam sodium; ampicillin; toradol [ketorolac tromethamine]; tramadol; ambien [zolpidem tartrate]; cephalexin; ketorolac; percocet [oxycodone-acetaminophen]; sulfamethoxazole-trimethoprim; and tape.  [place]  Drug: Ms. Crafts  reports that she does not use drugs. Alcohol:  reports that she does not drink alcohol. Tobacco:  reports that she has quit smoking. She has a 45.00 pack-year smoking history. She has never used smokeless tobacco. Medical:  has a past medical history of Anemia; B12 deficiency; Bacteremia (10/07/2014); Bladder incontinence; Blind left eye; Cataract; Cervical spine fracture ([place]); Chest pain (07/29/2013); Compression fracture; COPD (chronic obstructive pulmonary disease) ([place]); DDD  (degenerative disc disease), lumbar; Depression; Diffuse myofascial pain syndrome (03/24/2015); Fever (10/05/2014); GERD (gastroesophageal reflux disease); Hiatal hernia; Hyperlipidemia; Hypertension; Hypertensive kidney disease, malignant (11/07/2016); Macular degeneration; Osteoarthritis; Osteoporosis; Reactive airway disease; Rupture of bowel ([place]); Sepsis ([place]) (10/08/2014); Stroke [facility]); Stroke [facility]); and Wrist pain, acute (09/10/2012). Surgical: Ms. Tozzi  has a past surgical history that includes Abdominal surgery; Colon surgery; Joint replacement; Appendectomy; Bladder surgery; Rectocele repair; Knee surgery; Cholecystectomy; Abdominal hysterectomy; Ostomy; Eye surgery; and Intramedullary (im) nail intertrochanteric (Right, 09/13/2016). Family: family history includes Heart attack in her father; Heart failure in her mother.  Constitutional Exam  General appearance: Well nourished, well developed, and well hydrated. In no apparent acute distress Vitals:   02/12/17 0854 02/12/17 0856  BP:  138/90  Pulse:  77  Resp:  18  Temp:  98.6 F (37 C)  SpO2:  98%  Weight: 180 lb (81.6 kg)   Height: _0  (1.651 m)    BMI Assessment: Estimated body mass index is 29.95 kg/m as calculated from the following:   Height as of this encounter: _1  (1.651 m).   Weight as of this encounter: 180 lb (81.6 kg).  BMI interpretation table: BMI level Category Range association with higher incidence of chronic pain  <18 kg/m2 Underweight   18.5-24.9 kg/m2 Ideal body weight   25-29.9 kg/m2 Overweight Increased incidence by 20%  30-34.9 kg/m2 Obese (Class I) Increased incidence by 68%  35-39.9 kg/m2 Severe obesity (Class II) Increased incidence by 136%  >40 kg/m2 Extreme obesity (Class III) Increased incidence by 254%   BMI Readings from Last 4 Encounters:  02/12/17 29.95 kg/m  12/04/16 29.72 kg/m  11/07/16 30.79 kg/m  09/27/16 29.05 kg/m   Wt Readings from Last 4 Encounters:  02/12/17 180 lb (81.6  kg)  12/04/16 178 lb 9.6 oz (81 kg)  11/07/16 185 lb (83.9 kg)  09/27/16 180 lb (81.6 kg)  Psych/Mental status: Alert, oriented x 3 (person, place, & time)       Eyes: PERLA Respiratory: No evidence of acute respiratory distress  Cervical Spine Area Exam  Skin & Axial Inspection: No masses, redness, edema, swelling, or associated skin lesions Alignment: Symmetrical Functional ROM: Decreased ROM      Stability: No instability  detected Muscle Tone/Strength: Functionally intact. No obvious neuro-muscular anomalies detected. Sensory (Neurological): Movement-associated pain Palpation: Complains of area being tender to palpation              Upper Extremity (UE) Exam    Side: Right upper extremity  Side: Left upper extremity  Skin & Extremity Inspection: Skin color, temperature, and hair growth are WNL. No peripheral edema or cyanosis. No masses, redness, swelling, asymmetry, or associated skin lesions. No contractures.  Skin & Extremity Inspection: Skin color, temperature, and hair growth are WNL. No peripheral edema or cyanosis. No masses, redness, swelling, asymmetry, or associated skin lesions. No contractures.  Functional ROM: Unrestricted ROM          Functional ROM: Unrestricted ROM          Muscle Tone/Strength: Functionally intact. No obvious neuro-muscular anomalies detected.  Muscle Tone/Strength: Functionally intact. No obvious neuro-muscular anomalies detected.  Sensory (Neurological): Unimpaired          Sensory (Neurological): Unimpaired          Palpation: No palpable anomalies              Palpation: No palpable anomalies              Specialized Test(s): Deferred         Specialized Test(s): Deferred          Thoracic Spine Area Exam  Skin & Axial Inspection: No masses, redness, or swelling Alignment: Symmetrical Functional ROM: Unrestricted ROM Stability: No instability detected Muscle Tone/Strength: Functionally intact. No obvious neuro-muscular anomalies  detected. Sensory (Neurological): Unimpaired Muscle strength & Tone: No palpable anomalies  Lumbar Spine Area Exam  Skin & Axial Inspection: No masses, redness, or swelling Alignment: Symmetrical Functional ROM: Decreased ROM      Stability: No instability detected Muscle Tone/Strength: Functionally intact. No obvious neuro-muscular anomalies detected. Sensory (Neurological): Movement-associated pain Palpation: Complains of area being tender to palpation       Provocative Tests: Lumbar Hyperextension and rotation test: Positive bilaterally for facet joint pain. Lumbar Lateral bending test: evaluation deferred today       Patrick's Maneuver: evaluation deferred today                    Gait & Posture Assessment  Ambulation: Patient ambulates using a cane Gait: Limited. Using assistive device to ambulate Posture: Antalgic   Lower Extremity Exam    Side: Right lower extremity  Side: Left lower extremity  Skin & Extremity Inspection: Skin color, temperature, and hair growth are WNL. No peripheral edema or cyanosis. No masses, redness, swelling, asymmetry, or associated skin lesions. No contractures.  Skin & Extremity Inspection: Skin color, temperature, and hair growth are WNL. No peripheral edema or cyanosis. No masses, redness, swelling, asymmetry, or associated skin lesions. No contractures.  Functional ROM: Decreased ROM for knee joint  Functional ROM: Unrestricted ROM          Muscle Tone/Strength: Functionally intact. No obvious neuro-muscular anomalies detected.  Muscle Tone/Strength: Functionally intact. No obvious neuro-muscular anomalies detected.  Sensory (Neurological): Unimpaired  Sensory (Neurological): Unimpaired  Palpation: No palpable anomalies  Palpation: No palpable anomalies   Assessment & Plan  Primary Diagnosis & Pertinent Problem List: The primary encounter diagnosis was Chronic pain syndrome. Diagnoses of Chronic knee pain (Primary Area of Pain) (Right), Chronic  neck pain (Secondary Area of Pain) (Bilateral) (L>R), DDD (degenerative disc disease), cervical, Chronic shoulder pain (Bilateral), Chronic low back pain (Tertiary Area of Pain) (Bilateral) (  L>R), Lumbar facet syndrome (Bilateral) (R>L), Lumbar facet arthropathy (HCC), Chronic myofascial pain, Disorder of skeletal system, Problems influencing health status, Pharmacologic therapy, Long term prescription benzodiazepine use, Opioid-induced constipation (OIC), Opiate use, and NSAID long-term use were also pertinent to this visit.  Visit Diagnosis: 1. Chronic pain syndrome   2. Chronic knee pain (Primary Area of Pain) (Right)   3. Chronic neck pain (Secondary Area of Pain) (Bilateral) (L>R)   4. DDD (degenerative disc disease), cervical   5. Chronic shoulder pain (Bilateral)   6. Chronic low back pain [facility] Area of Pain) (Bilateral) (L>R)   7. Lumbar facet syndrome (Bilateral) (R>L)   8. Lumbar facet arthropathy (HCC)   9. Chronic myofascial pain   10. Disorder of skeletal system   11. Problems influencing health status   12. Pharmacologic therapy   13. Long term prescription benzodiazepine use   14. Opioid-induced constipation (OIC)   15. Opiate use   16. NSAID long-term use    Problems updated and reviewed during this visit: No problems updated.  Plan of Care  Pharmacotherapy (Medications Ordered): Meds ordered this encounter  Medications  . oxyCODONE (OXY IR/ROXICODONE) 5 MG immediate release tablet    Sig: Take 1 tablet (5 mg total) by mouth every 8 (eight) hours as needed for severe pain.    Dispense:  90 tablet    Refill:  0    Do not place this medication, or any other prescription from our practice, on "Automatic Refill". Patient may have prescription filled one day early if pharmacy is closed on scheduled refill date. Do not fill until: 02/12/17 To last until: 03/14/17   Procedure Orders    No procedure(s) ordered today   Lab Orders  No laboratory test(s) ordered today    Imaging Orders  No imaging studies ordered today   Referral Orders  No referral(s) requested today   Time Note: Greater than 50% of the 40 minute(s) of face-to-face time spent with Ms. Cazarez, was spent in counseling/coordination of care regarding: the appropriate use of the pain scale, Ms. Creasman's primary cause of pain, the results of her recent test(s), the significance of each one oth the test(s) anomalies and it's corresponding characteristic pain pattern(s), the treatment plan, treatment alternatives, the risks and possible complications of proposed treatment and medication side effects.  Pharmacological management options:  Opioid Analgesics: We'll take over management today. See above orders Membrane stabilizer: We have discussed the possibility of optimizing this mode of therapy, if tolerated Muscle relaxant: We have discussed the possibility of a trial NSAID: We have discussed the possibility of a trial Other analgesic(s): To be determined at a later time   Interventional management options: Planned, scheduled, and/or pending:    None at this time.    Considering:   Diagnostic bilateral cervical epidural steroid injection Diagnostic bilateral cervical lumbar facet injection Possible bilateral cervical radiofrequency ablation Diagnostic bilateral lumbar epidural steroid injection Diagnostic bilateral lumbar facet injection bilateral Possible lumbar facet RFA Diagnostic  right knee genicular nerve block Possible right knee RFA   PRN Procedures:   None at this time   Provider-requested follow-up: Return in about 3 weeks (around 03/05/2017) for Med-Mgmt by Dr. Dossie Arbour.  Future Appointments Date Time Provider [place]  03/05/2017 1:30 PM Milinda Pointer, MD [facility] None  06/10/2017 11:30 AM Penumalli, Earlean Polka, MD [facility] None    Primary Care Physician: Wenda Low, MD Location: [facility] Note by: Gaspar Cola,  MD Date: 02/12/2017; Time: 9:54  AM

## 2017-02-12 NOTE — Patient Instructions (Addendum)
_______________________________________________________________________________________Medication agreement signed.  Instructed patient to bring back all medication to next visit.  Informed patient that Dr Dossie Arbour would like for her to taper off her Xanax.  __You were given a script for oxycodone x 1 today.  ___  Appointment Policy Summary  It is our goal and responsibility to provide the medical community with assistance in the evaluation and management of patients with chronic pain. Unfortunately our resources are limited. Because we do not have an unlimited amount of time, or available appointments, we are required to closely monitor and manage their use. The following rules exist to maximize their use:  Patient's responsibilities: 1. Punctuality:  At what time should I arrive? You should be physically present in our office 30 minutes before your scheduled appointment. Your scheduled appointment is with your assigned healthcare provider. However, it takes 5-10 minutes to be "checked-in", and another 15 minutes for the nurses to do the admission. If you arrive to our office at the time you were given for your appointment, you will end up being at least 20-25 minutes late to your appointment with the provider. 2. Tardiness:  What happens if I arrive only a few minutes after my scheduled appointment time? You will need to reschedule your appointment. The cutoff is your appointment time. This is why it is so important that you arrive at least 30 minutes before that appointment. If you have an appointment scheduled for 10:00 AM and you arrive at 10:01, you will be required to reschedule your appointment.  3. Plan ahead:  Always assume that you will encounter traffic on your way in. Plan for it. If you are dependent on a driver, make sure they understand these rules and the need to arrive early. 4. Other appointments and responsibilities:  Avoid scheduling any other appointments before or after your  pain clinic appointments.  5. Be prepared:  Write down everything that you need to discuss with your healthcare provider and give this information to the admitting nurse. Write down the medications that you will need refilled. Bring your pills and bottles (even the empty ones), to all of your appointments, except for those where a procedure is scheduled. 6. No children or pets:  Find someone to take care of them. It is not appropriate to bring them in. 7. Scheduling changes:  We request "advanced notification" of any changes or cancellations. 8. Advanced notification:  Defined as a time period of more than 24 hours prior to the originally scheduled appointment. This allows for the appointment to be offered to other patients. 9. Rescheduling:  When a visit is rescheduled, it will require the cancellation of the original appointment. For this reason they both fall within the category of "Cancellations".  10. Cancellations:  They require advanced notification. Any cancellation less than 24 hours before the  appointment will be recorded as a "No Show". 11. No Show:  Defined as an unkept appointment where the patient failed to notify or declare to the practice their intention or inability to keep the appointment.  Corrective process for repeat offenders:  1. Tardiness: Three (3) episodes of rescheduling due to late arrivals will be recorded as one (1) "No Show". 2. Cancellation or reschedule: Three (3) cancellations or rescheduling will be recorded as one (1) "No Show". 3. "No Shows": Three (3) "No Shows" within a 12 month period will result in discharge from the practice.  ____________________________________________________________________________________________   ____________________________________________________________________________________________  Medication Rules  Applies to: All patients receiving prescriptions (written or electronic).  Pharmacy of record: Pharmacy where  electronic prescriptions will be sent. If written prescriptions are taken to a different pharmacy, please inform the nursing staff. The pharmacy listed in the electronic medical record should be the one where you would like electronic prescriptions to be sent.  Prescription refills: Only during scheduled appointments. Applies to both, written and electronic prescriptions.  NOTE: The following applies primarily to controlled substances (Opioid* Pain Medications).   Patient's responsibilities: 1. Pain Pills: Bring all pain pills to every appointment (except for procedure appointments). 2. Pill Bottles: Bring pills in original pharmacy bottle. Always bring newest bottle. Bring bottle, even if empty. 3. Medication refills: You are responsible for knowing and keeping track of what medications you need refilled. The day before your appointment, write a list of all prescriptions that need to be refilled. Bring that list to your appointment and give it to the admitting nurse. Prescriptions will be written only during appointments. If you forget a medication, it will not be "Called in", "Faxed", or "electronically sent". You will need to get another appointment to get these prescribed. 4. Prescription Accuracy: You are responsible for carefully inspecting your prescriptions before leaving our office. Have the discharge nurse carefully go over each prescription with you, before taking them home. Make sure that your name is accurately spelled, that your address is correct. Check the name and dose of your medication to make sure it is accurate. Check the number of pills, and the written instructions to make sure they are clear and accurate. Make sure that you are given enough medication to last until your next medication refill appointment. 5. Taking Medication: Take medication as prescribed. Never take more pills than instructed. Never take medication more frequently than prescribed. Taking less pills or less  frequently is permitted and encouraged, when it comes to controlled substances (written prescriptions).  6. Inform other Doctors: Always inform, all of your healthcare providers, of all the medications you take. 7. Pain Medication from other Providers: You are not allowed to accept any additional pain medication from any other Doctor or Healthcare provider. There are two exceptions to this rule. (see below) In the event that you require additional pain medication, you are responsible for notifying us, as stated below. 8. Medication Agreement: You are responsible for carefully reading and following our Medication Agreement. This must be signed before receiving any prescriptions from our practice. Safely store a copy of your signed Agreement. Violations to the Agreement will result in no further prescriptions. (Additional copies of our Medication Agreement are available upon request.) 9. Laws, Rules, & Regulations: All patients are expected to follow all Federal and [employer], Rules, [employer]. Ignorance of the Laws does not constitute a valid excuse. The use of any illegal substances is prohibited. 10. Adopted CDC guidelines & recommendations: Target dosing levels will be at or below 60 MME/day. Use of benzodiazepines** is not recommended.  Exceptions: There are only two exceptions to the rule of not receiving pain medications from other Healthcare Providers. 1. Exception #1 (Emergencies): In the event of an emergency (i.e.: accident requiring emergency care), you are allowed to receive additional pain medication. However, you are responsible for: As soon as you are able, call our office [phone], at any time of the day or night, and leave a message stating your name, the date and nature of the emergency, and the name and dose of the medication prescribed. In the event that your call is answered by a member of our staff,  make sure to document and save the date, time, and the name of the  person that took your information.  2. Exception #2 (Planned Surgery): In the event that you are scheduled by another doctor or dentist to have any type of surgery or procedure, you are allowed (for a period no longer than 30 days), to receive additional pain medication, for the acute post-op pain. However, in this case, you are responsible for picking up a copy of our "Post-op Pain Management for Surgeons" handout, and giving it to your surgeon or dentist. This document is available at our office, and does not require an appointment to obtain it. Simply go to our office during business hours (Monday-Thursday from 8:00 AM to 4:00 PM) (Friday 8:00 AM to 12:00 Noon) or if you have a scheduled appointment with [place], prior to your surgery, and ask for it by name. In addition, you will need to provide [place] with your name, name of your surgeon, type of surgery, and date of procedure or surgery.  *Opioid medications include: morphine, codeine, oxycodone, oxymorphone, hydrocodone, hydromorphone, meperidine, tramadol, tapentadol, buprenorphine, fentanyl, methadone. **Benzodiazepine medications include: diazepam (Valium), alprazolam (Xanax), clonazepam (Klonopine), lorazepam (Ativan), clorazepate (Tranxene), chlordiazepoxide (Librium), estazolam (Prosom), oxazepam (Serax), temazepam (Restoril), triazolam (Halcion)  ____________________________________________________________________________________________ ____________________________________________________________________________________________  Pain Scale  Introduction: The pain score used by this practice is the Verbal Numerical Rating Scale (VNRS-11). This is an 11-point scale. It is for adults and children 10 years or older. There are significant differences in how the pain score is reported, used, and applied. Forget everything you learned in the past and learn this scoring system.  General Information: The scale should reflect your current level of pain.  Unless you are specifically asked for the level of your worst pain, or your average pain. If you are asked for one of these two, then it should be understood that it is over the past 24 hours.  Basic Activities of Daily Living (ADL): Personal hygiene, dressing, eating, transferring, and using restroom.  Instructions: Most patients tend to report their level of pain as a combination of two factors, their physical pain and their psychosocial pain. This last one is also known as "suffering" and it is reflection of how physical pain affects you socially and psychologically. From now on, report them separately. From this point on, when asked to report your pain level, report only your physical pain. Use the following table for reference.  Pain Clinic Pain Levels (0-5/10)  Pain Level Score  Description  No Pain 0   Mild pain 1 Nagging, annoying, but does not interfere with basic activities of daily living (ADL). Patients are able to eat, bathe, get dressed, toileting (being able to get on and off the toilet and perform personal hygiene functions), transfer (move in and out of bed or a chair without assistance), and maintain continence (able to control bladder and bowel functions). Blood pressure and heart rate are unaffected. A normal heart rate for a healthy adult ranges from 60 to 100 bpm (beats per minute).   Mild to moderate pain 2 Noticeable and distracting. Impossible to hide from other people. More frequent flare-ups. Still possible to adapt and function close to normal. It can be very annoying and may have occasional stronger flare-ups. With discipline, patients may get used to it and adapt.   Moderate pain 3 Interferes significantly with activities of daily living (ADL). It becomes difficult to feed, bathe, get dressed, get on and off the toilet or to perform personal hygiene  functions. Difficult to get in and out of bed or a chair without assistance. Very distracting. With effort, it can be ignored  when deeply involved in activities.   Moderately severe pain 4 Impossible to ignore for more than a few minutes. With effort, patients may still be able to manage work or participate in some social activities. Very difficult to concentrate. Signs of autonomic nervous system discharge are evident: dilated pupils (mydriasis); mild sweating (diaphoresis); sleep interference. Heart rate becomes elevated (>115 bpm). Diastolic blood pressure (lower number) rises above 100 mmHg. Patients find relief in laying down and not moving.   Severe pain 5 Intense and extremely unpleasant. Associated with frowning face and frequent crying. Pain overwhelms the senses.  Ability to do any activity or maintain social relationships becomes significantly limited. Conversation becomes difficult. Pacing back and forth is common, as getting into a comfortable position is nearly impossible. Pain wakes you up from deep sleep. Physical signs will be obvious: pupillary dilation; increased sweating; goosebumps; brisk reflexes; cold, clammy hands and feet; nausea, vomiting or dry heaves; loss of appetite; significant sleep disturbance with inability to fall asleep or to remain asleep. When persistent, significant weight loss is observed due to the complete loss of appetite and sleep deprivation.  Blood pressure and heart rate becomes significantly elevated. Caution: If elevated blood pressure triggers a pounding headache associated with blurred vision, then the patient should immediately seek attention at an urgent or emergency care unit, as these may be signs of an impending stroke.    Emergency Department Pain Levels (6-10/10)  Emergency Room Pain 6 Severely limiting. Requires emergency care and should not be seen or managed at an outpatient pain management facility. Communication becomes difficult and requires great effort. Assistance to reach the emergency department may be required. Facial flushing and profuse sweating along with  potentially dangerous increases in heart rate and blood pressure will be evident.   Distressing pain 7 Self-care is very difficult. Assistance is required to transport, or use restroom. Assistance to reach the emergency department will be required. Tasks requiring coordination, such as bathing and getting dressed become very difficult.   Disabling pain 8 Self-care is no longer possible. At this level, pain is disabling. The individual is unable to do even the most "basic" activities such as walking, eating, bathing, dressing, transferring to a bed, or toileting. Fine motor skills are lost. It is difficult to think clearly.   Incapacitating pain 9 Pain becomes incapacitating. Thought processing is no longer possible. Difficult to remember your own name. Control of movement and coordination are lost.   The worst pain imaginable 10 At this level, most patients pass out from pain. When this level is reached, collapse of the autonomic nervous system occurs, leading to a sudden drop in blood pressure and heart rate. This in turn results in a temporary and dramatic drop in blood flow to the brain, leading to a loss of consciousness. Fainting is one of the body's self defense mechanisms. Passing out puts the brain in a calmed state and causes it to shut down for a while, in order to begin the healing process.    Summary: 1. Refer to this scale when providing [place] with your pain level. 2. Be accurate and careful when reporting your pain level. This will help with your care. 3. Over-reporting your pain level will lead to loss of credibility. 4. Even a level of 1/10 means that there is pain and will be treated at our facility. 5. High,  inaccurate reporting will be documented as "Symptom Exaggeration", leading to loss of credibility and suspicions of possible secondary gains such as obtaining more narcotics, or wanting to appear disabled, for fraudulent reasons. 6. Only pain levels of 5 or below will be seen at  our facility. 7. Pain levels of 6 and above will be sent to the Emergency Department and the appointment cancelled. ____________________________________________________________________________________________

## 2017-02-14 ENCOUNTER — Telehealth: Payer: Self-pay | Admitting: Pain Medicine

## 2017-02-14 NOTE — Telephone Encounter (Signed)
Spoke with Dr Dossie Arbour.  Verbal order received to assess side effects of Oxycodone that patient is experiencing.  If only itching, may take Zyrtec 10 mg po at bedtime.  If continues itching, make evaluation appointment to discuss medication change.  Attempted to call patient.  Left voicemail to return our call.

## 2017-02-14 NOTE — Telephone Encounter (Signed)
Patient's daughter called stating Oxy is making patient itch pretty bad, would like a call to discuss this please

## 2017-02-17 NOTE — Telephone Encounter (Signed)
Spoke with [place].  She states that she has not taken oxycodone since she has been itching.  INformed patient that Dr Dossie Arbour states that she could take Zyrtec 10 mg po at bedtime to see if itching stopped.  Patient denies any shortness of breath or any other side effects.  INformed patient that if the itching continued, she needed to make an evaluation appointment to discuss med changes.

## 2017-02-23 IMAGING — DX DG THORACIC SPINE 2V
3 series · 3 of 3 positions shown · non-contrast
Comparison: Prior CT scan of the thoracic spine 02/15/2015

CLINICAL DATA: 74-year-old female with acute on chronic back pain
after falling 5 days previously. Pain localizes to the thoracic
spine.

EXAM:
THORACIC SPINE 2 VIEWS

[t-spine ap]
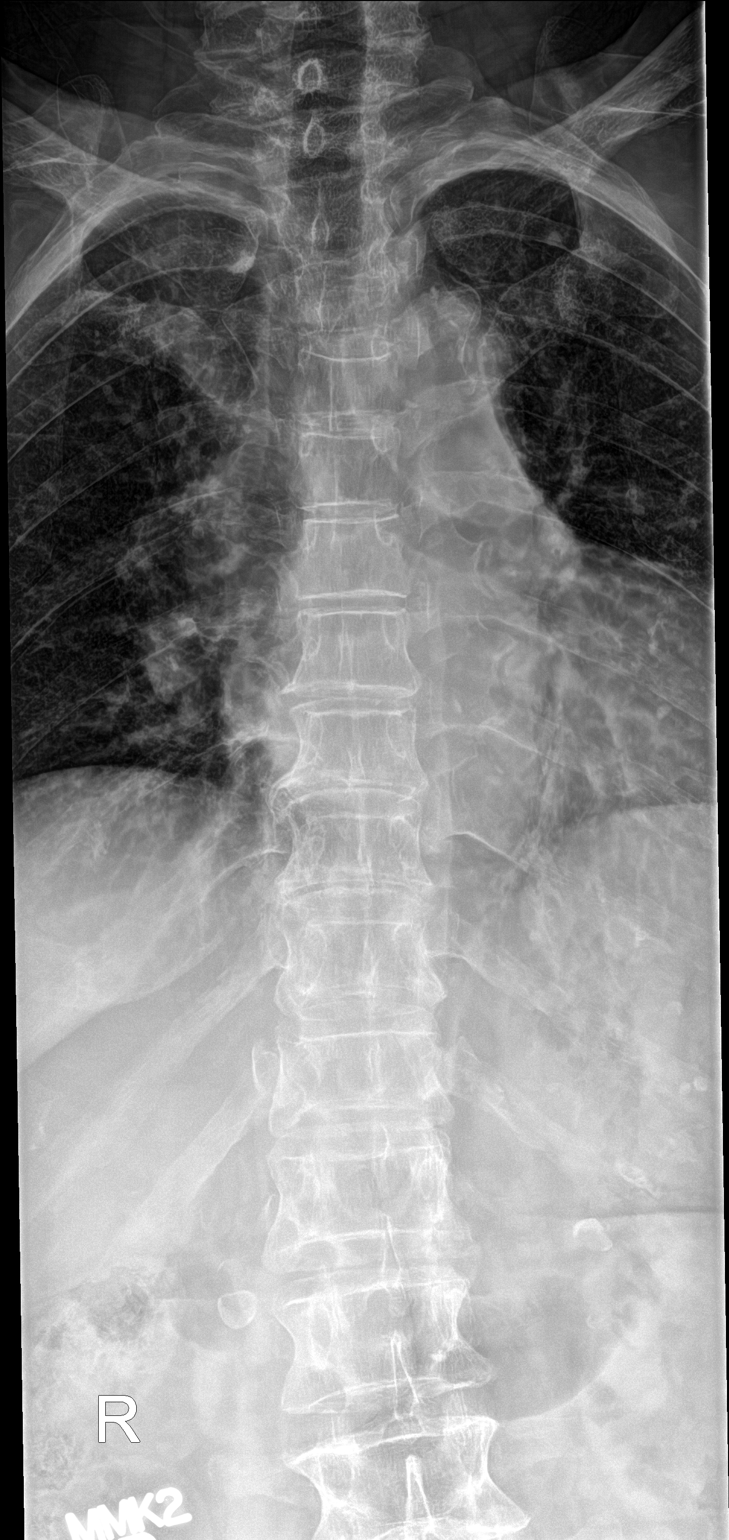

[t-spine lat]
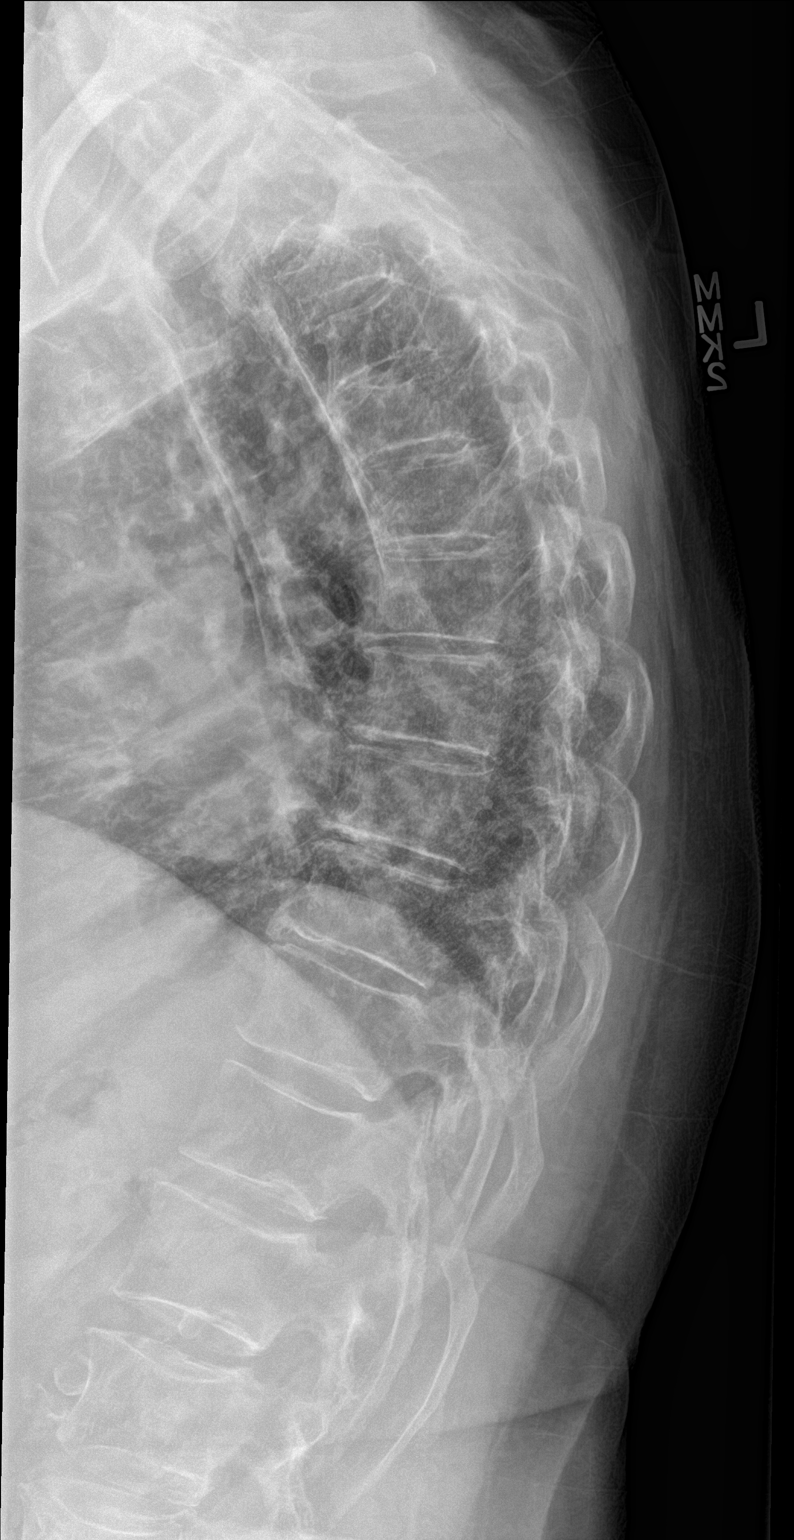

[t-spine swimmers]
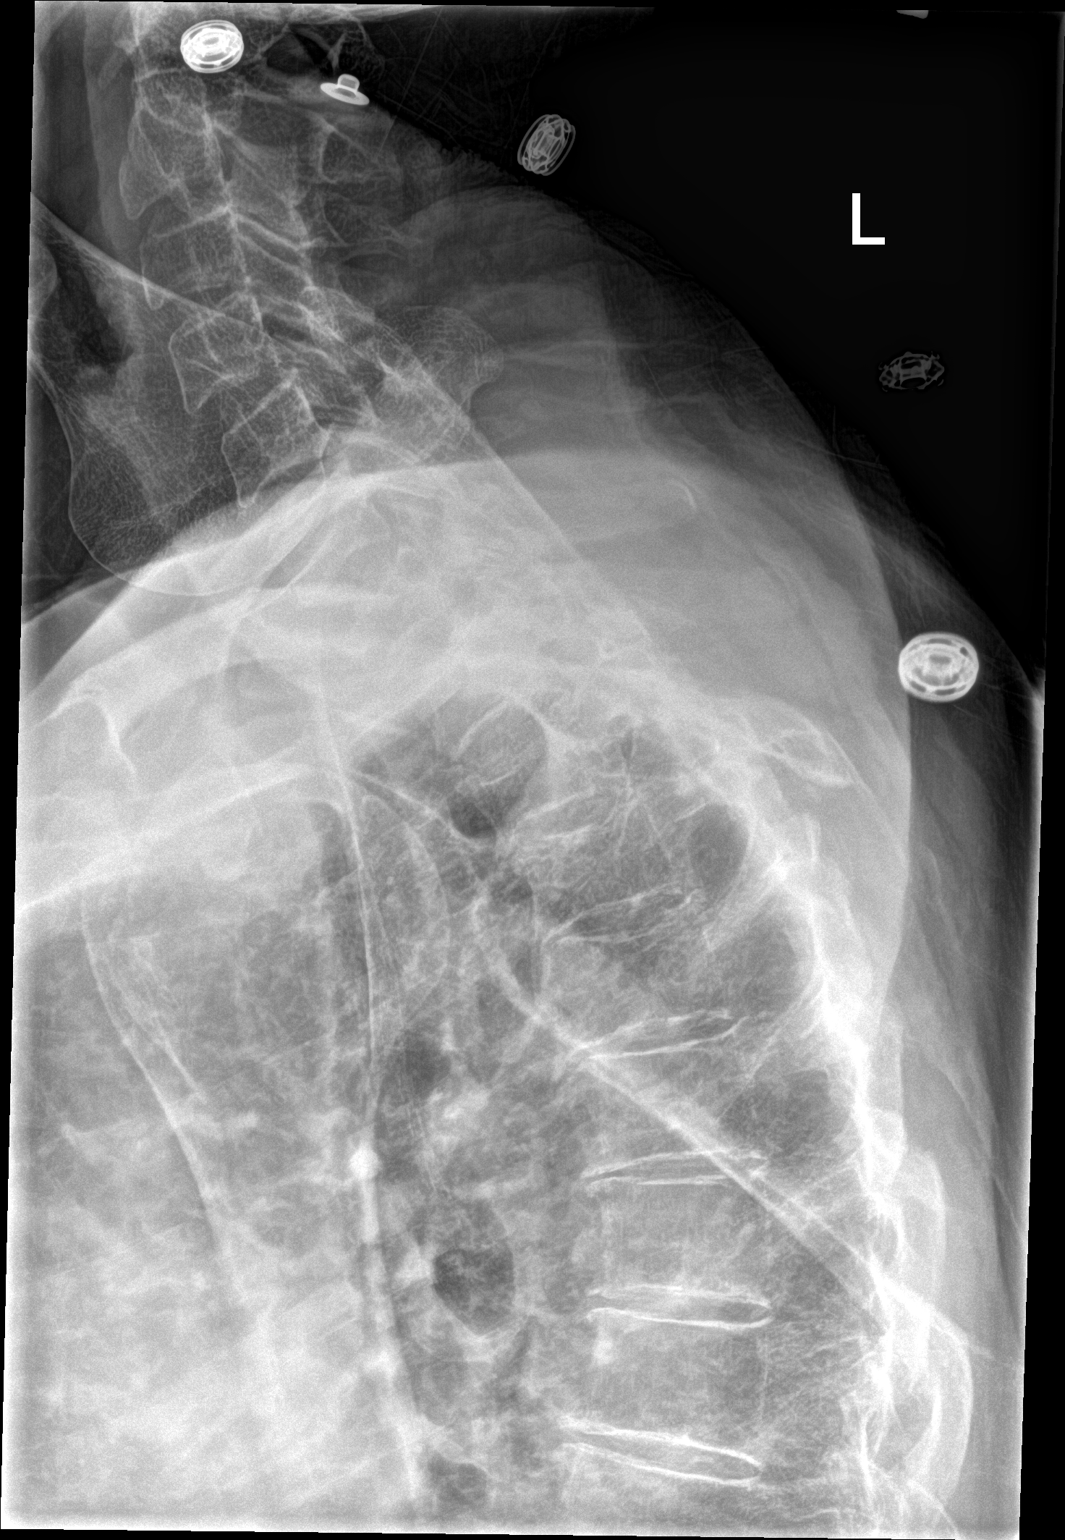

[3 of 3 positions shown; findings below may reference images not displayed]

FINDINGS: There is no evidence of thoracic spine fracture. Alignment is
normal. No other significant bone abnormalities are identified.
Atherosclerotic vascular calcifications present in both renal
arteries proximally.
IMPRESSION: Negative.

## 2017-02-23 IMAGING — CT CT HEAD W/O CM
5 of 6 series · 18 of 47 positions shown, 19 images · non-contrast
Comparison: Head and neck CT 02/20/2015

CLINICAL DATA: Syncopal episode today. Fall. Headache and neck
pain.

EXAM:
CT HEAD WITHOUT CONTRAST
CT CERVICAL SPINE WITHOUT CONTRAST
TECHNIQUE: Multidetector CT imaging of the head and cervical spine was
performed following the standard protocol without intravenous
contrast. Multiplanar CT image reconstructions of the cervical spine
were also generated.

[Series 3: head bone · axial · 0.43mm/px · z∈[+85,+193]mm · 7 of 74 slices shown]
[im 10/74  bone]
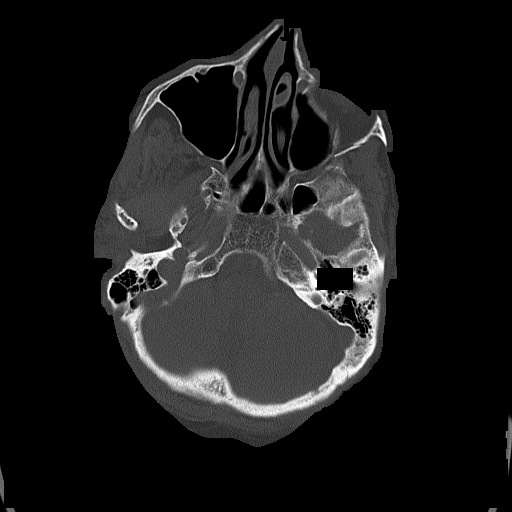
[im 19/74  bone]
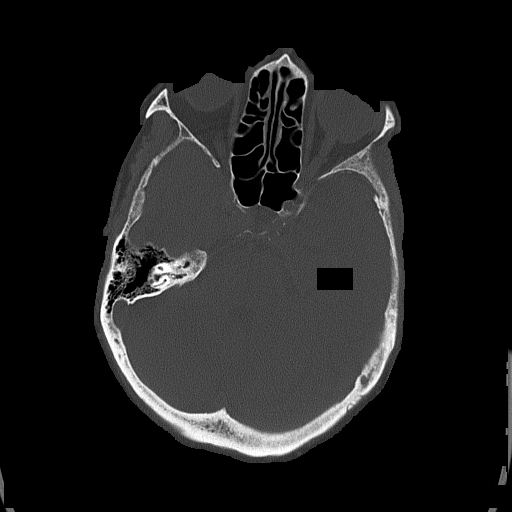
[im 28/74  bone]
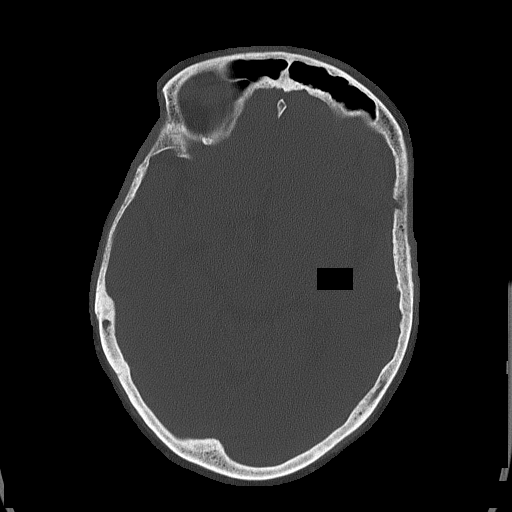
[im 37/74  bone]
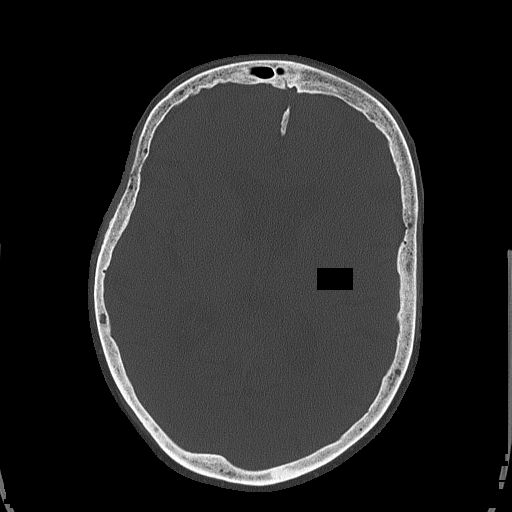
[im 46/74  bone]
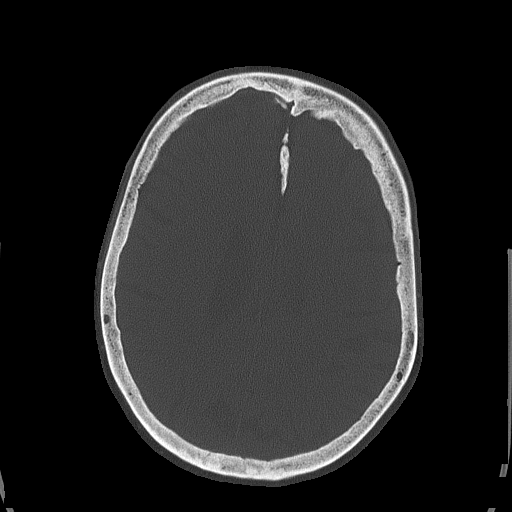
[im 55/74  bone]
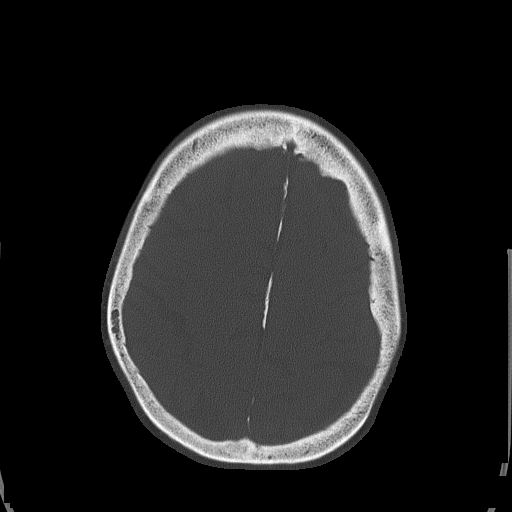
[im 64/74  bone]
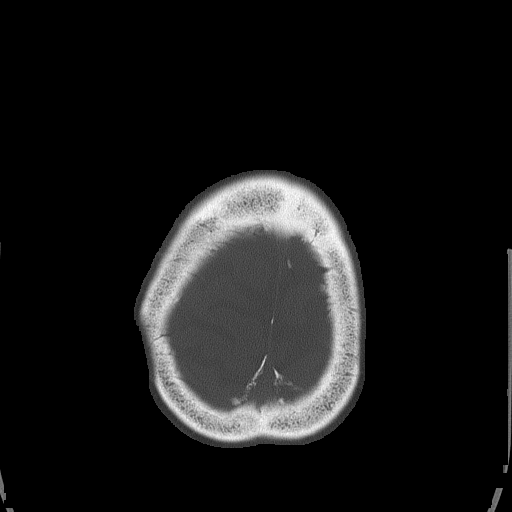

[Series 4: head without · axial · non-contrast · 0.43mm/px · z∈[+67,+212]mm · 3 of 30 slices shown, 4 images]
[im 1/30  brain]
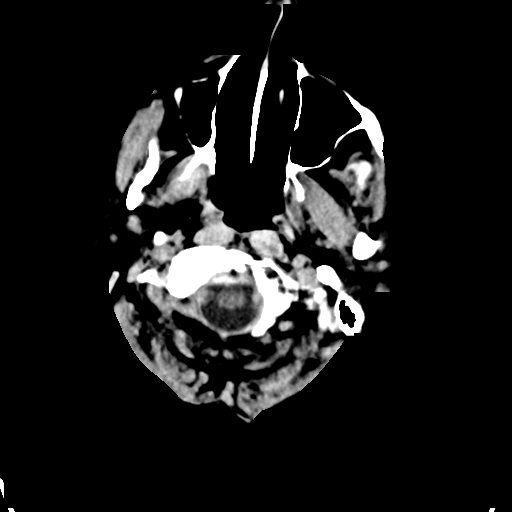
[im 1/30  bone]
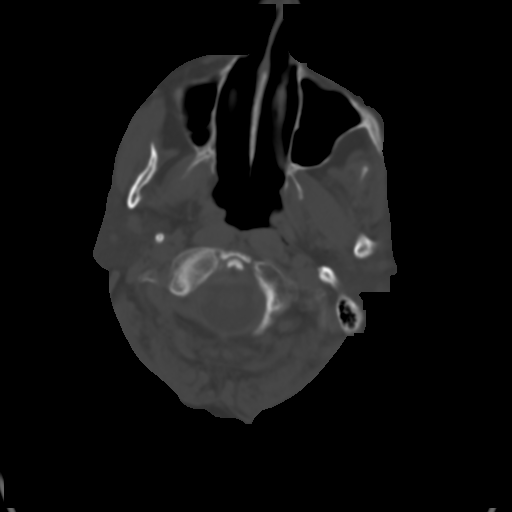
[im 15/30  brain]
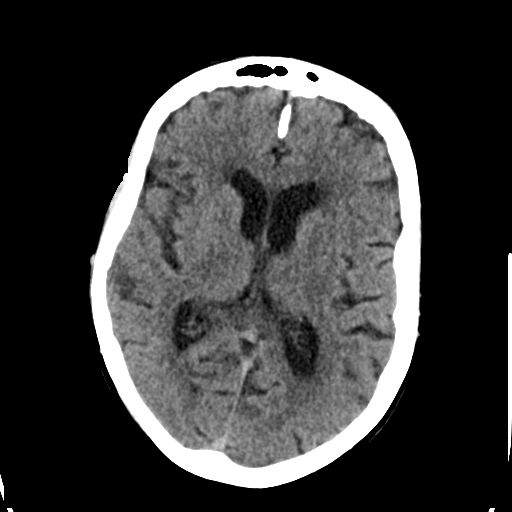
[im 30/30  brain]
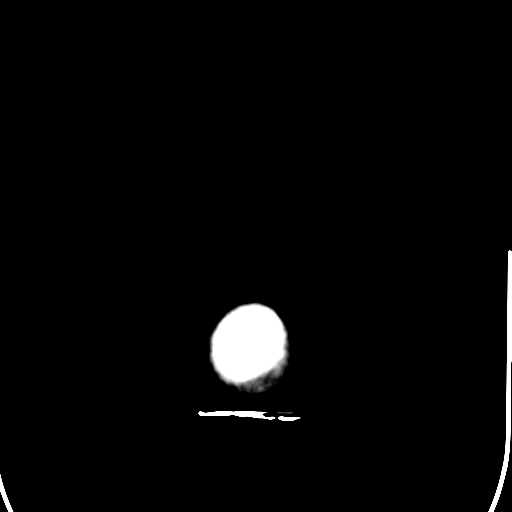

[Series 5: c_spine 2.0 st · axial · 0.31mm/px · z∈[-61,-41]mm · 2 of 84 slices shown]
[im 11/84  brain]
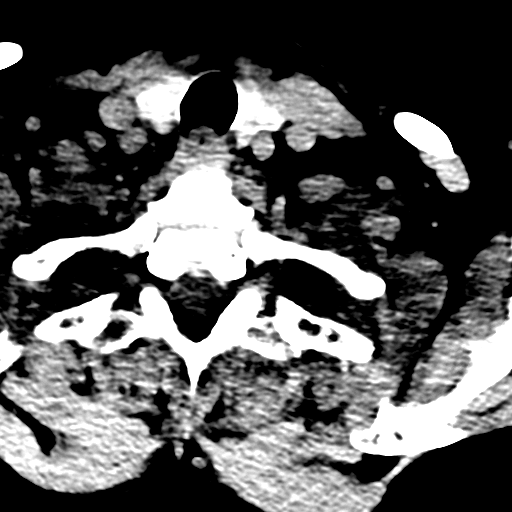
[im 21/84  brain]
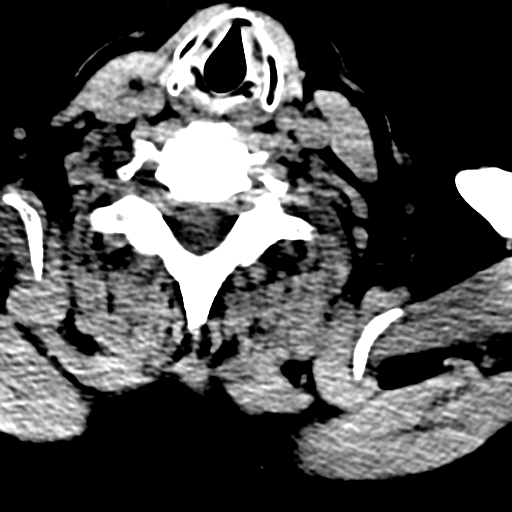

[Series 7: c_spine 2.0 sag bone · sagittal · 0.33mm/px · 3 of 54 slices shown]
[im 18/54  brain]
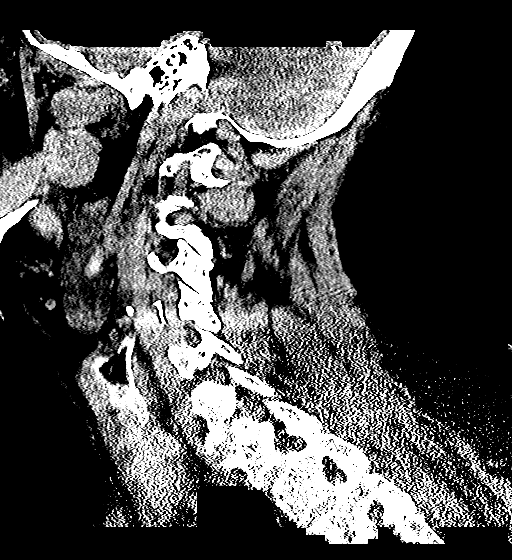
[im 27/54  brain]
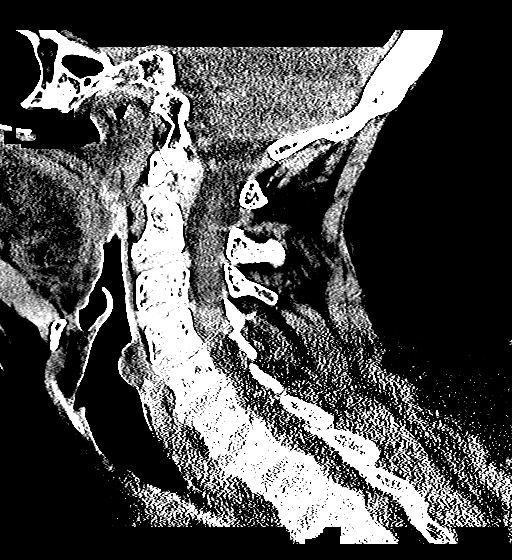
[im 36/54  brain]
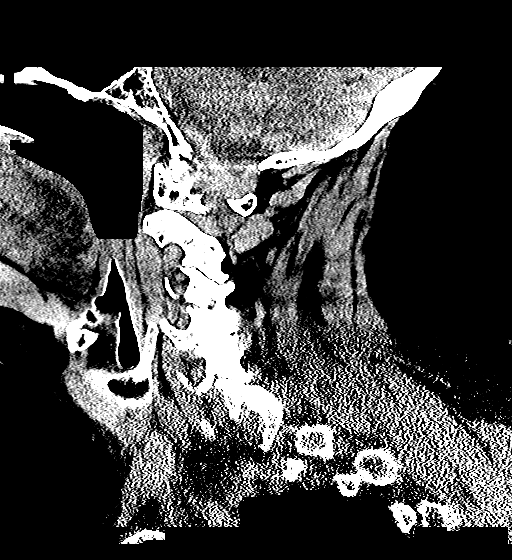

[Series 8: c_spine 2.0 cor bone · coronal · 0.33mm/px · 3 of 49 slices shown]
[im 17/49  brain]
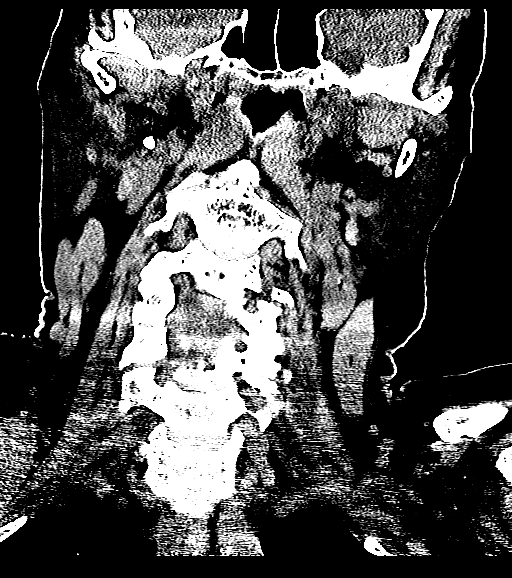
[im 22/49  brain]
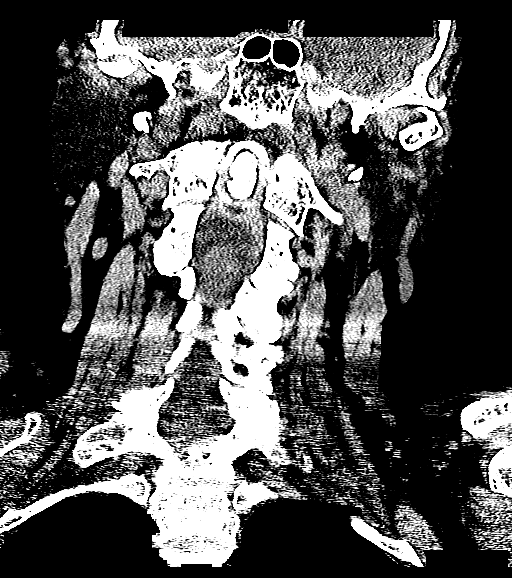
[im 27/49  brain]
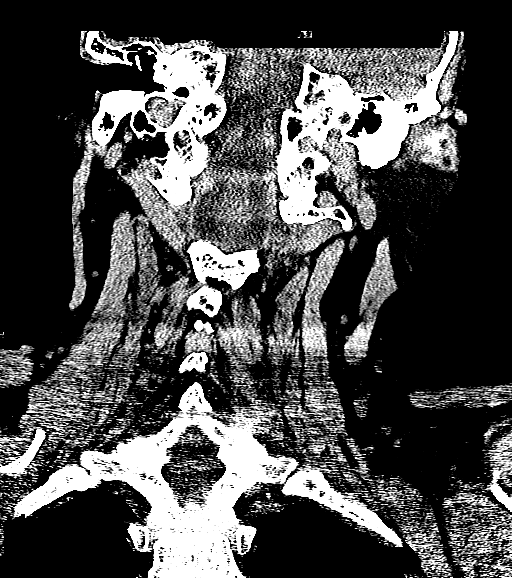

[18 of 47 positions shown; findings below may reference images not displayed]

FINDINGS: CT HEAD FINDINGS

No intracranial hemorrhage. No parenchymal contusion. No midline
shift or mass effect. Basilar cisterns are patent. No skull base
fracture. No fluid in the paranasal sinuses or mastoid air cells.
Orbits are normal.

There is generalized cortical atrophy. Mild periventricular white
matter hypodensities.

CT CERVICAL SPINE FINDINGS

No prevertebral soft tissue swelling. Normal alignment of cervical
vertebral bodies. No loss of vertebral body height. Normal facet
articulation. Normal craniocervical junction.

There is compression deformity of the C7 vertebral body anteriorly
with approximately 25% vertebral body height loss. This is not
changed from comparison exam.

No evidence epidural or paraspinal hematoma.
IMPRESSION: 1. No intracranial trauma.
2. Atrophy and microvascular disease unchanged.
3. No cervical spine fracture.
4. Chronic compression deformity of C7 is stable.

## 2017-02-23 IMAGING — DX DG CHEST 2V
2 series · 2 of 2 positions shown · non-contrast
Comparison: 05/22/2015

CLINICAL DATA: Chronic back pain

EXAM:
CHEST  2 VIEW

[chest pa]
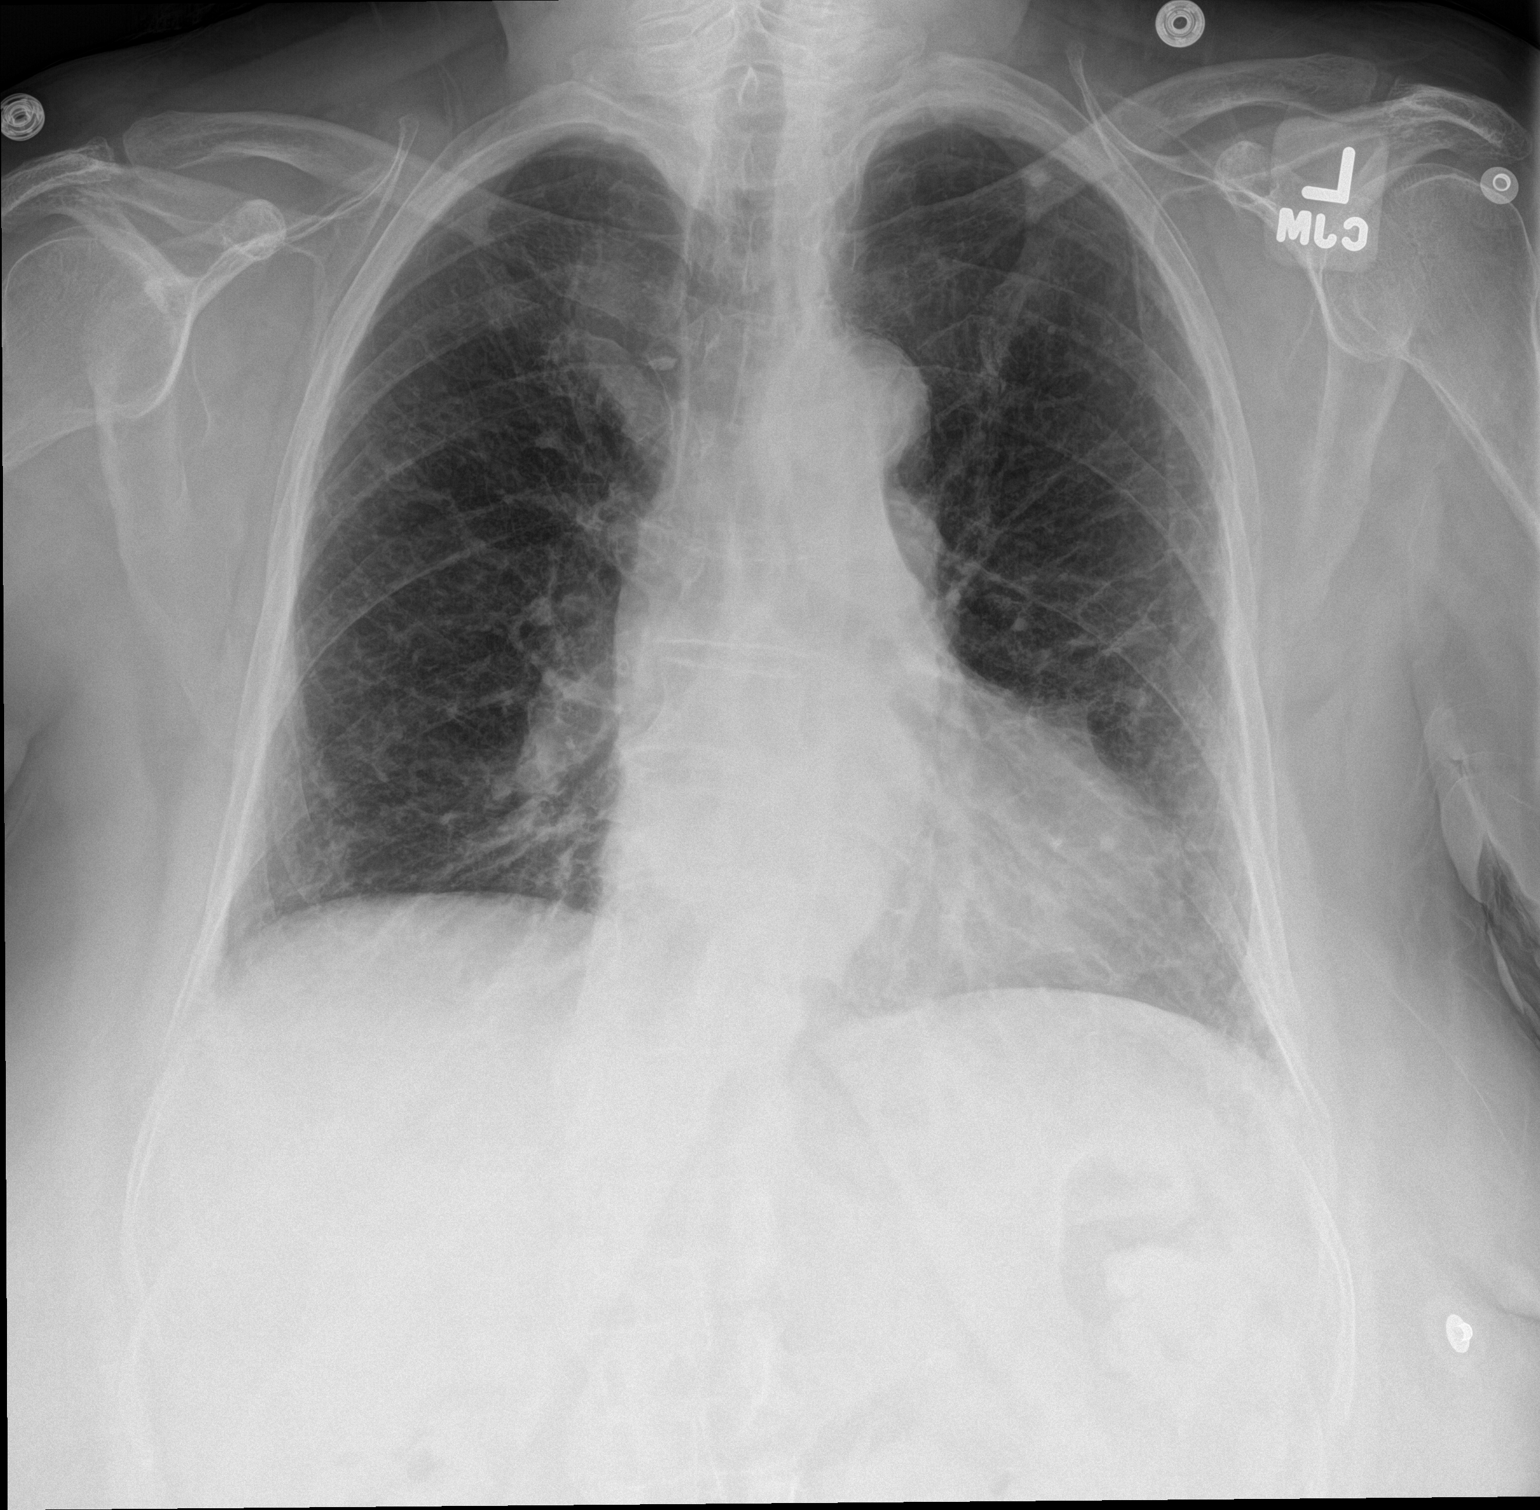

[chest lat]
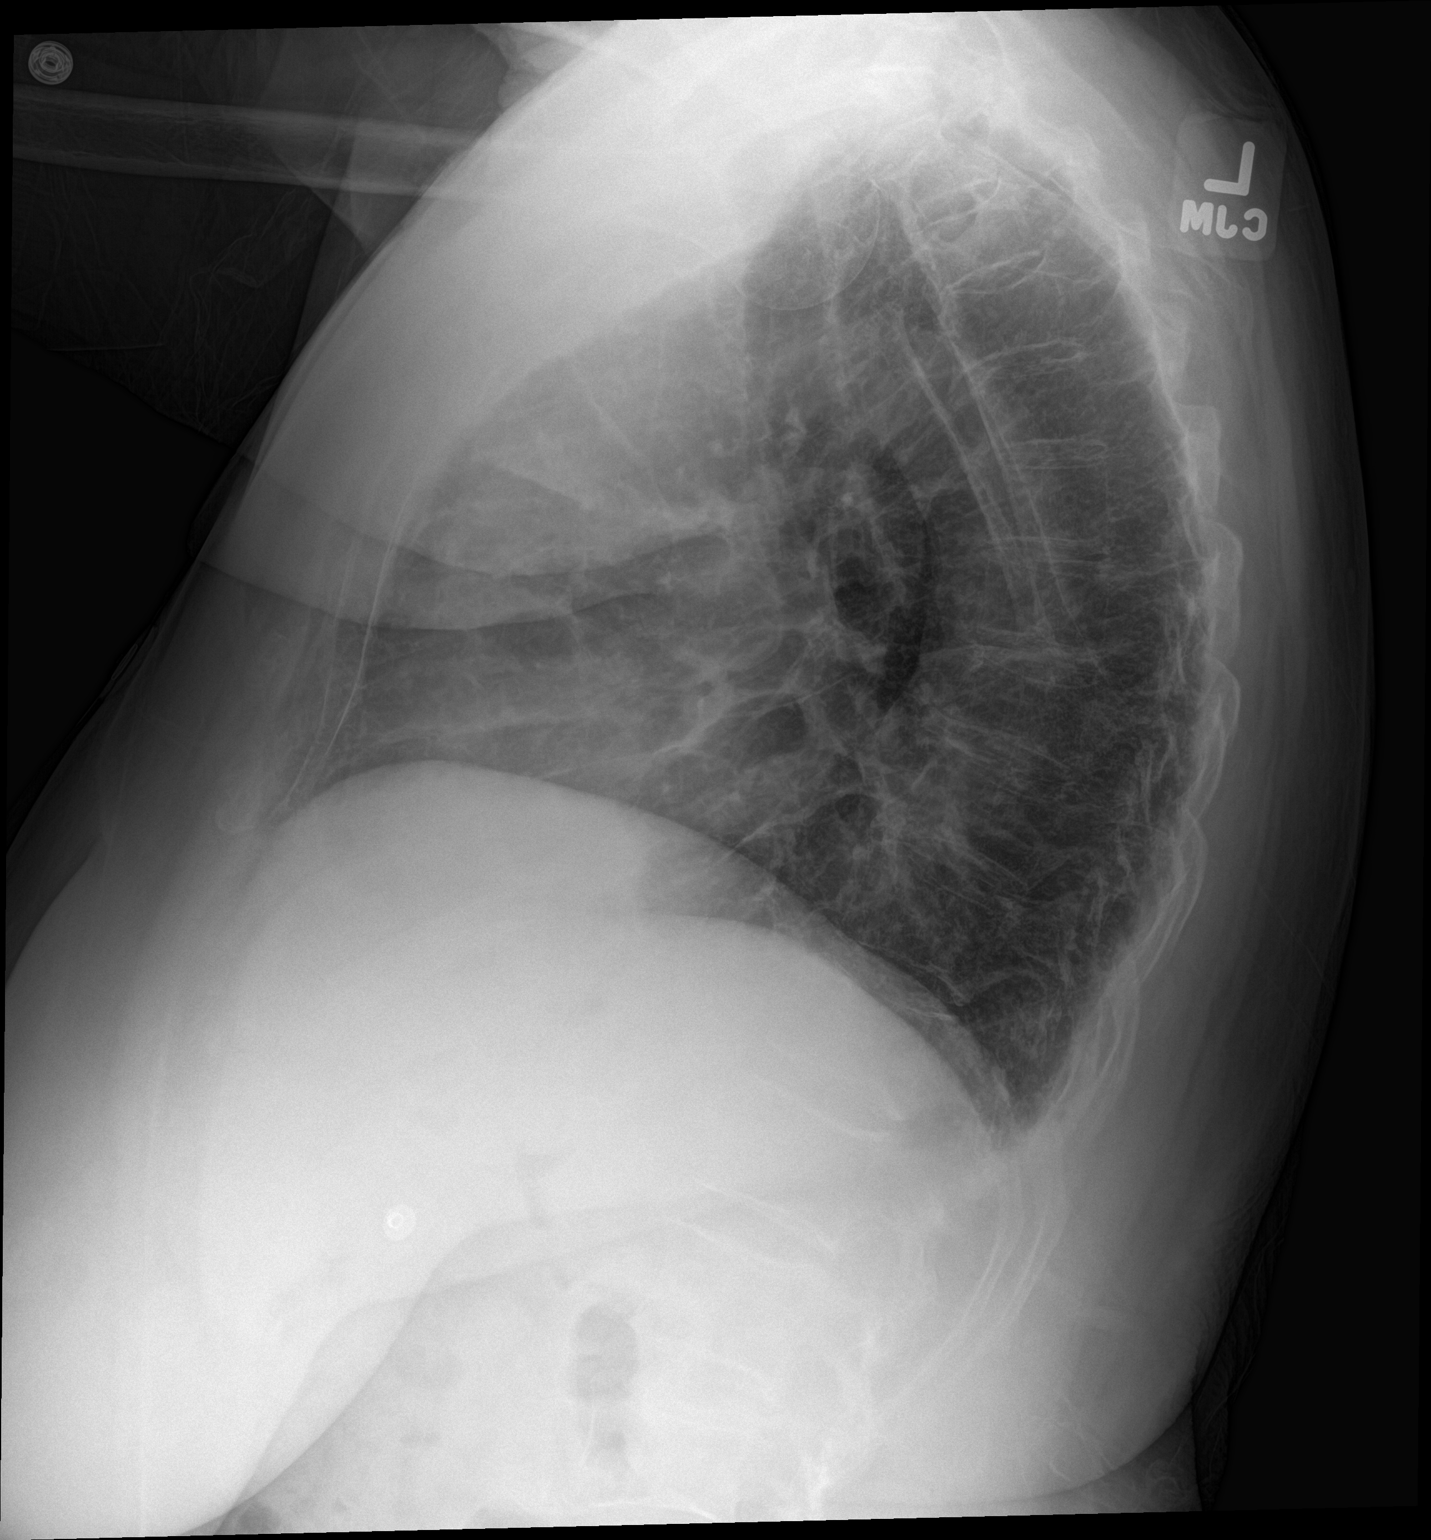

[2 of 2 positions shown; findings below may reference images not displayed]

FINDINGS: There is mild cardiac enlargement. No pleural effusion or edema
identified. Calcified granulomas identified within the left lung.
Chronic interstitial coarsening and scarring is noted bilaterally.
No superimposed airspace consolidation.
IMPRESSION: 1. Chronic lung disease.
2. No acute findings.

## 2017-02-28 ENCOUNTER — Ambulatory Visit (INDEPENDENT_AMBULATORY_CARE_PROVIDER_SITE_OTHER): Payer: Medicare Other

## 2017-02-28 ENCOUNTER — Ambulatory Visit (INDEPENDENT_AMBULATORY_CARE_PROVIDER_SITE_OTHER): Payer: Medicare Other | Admitting: Orthopaedic Surgery

## 2017-02-28 ENCOUNTER — Encounter (INDEPENDENT_AMBULATORY_CARE_PROVIDER_SITE_OTHER): Payer: Self-pay | Admitting: Orthopaedic Surgery

## 2017-02-28 DIAGNOSIS — S72141A Displaced intertrochanteric fracture of right femur, initial encounter for closed fracture: Secondary | ICD-10-CM

## 2017-02-28 NOTE — Progress Notes (Signed)
Office Visit Note   Patient: [name]           Date of Birth: [birth year]           MRN: [id number] Visit Date: 02/28/2017              Requested by: Wenda Low, MD [address] [place], [place] [address] PCP: Wenda Low, MD   Assessment & Plan: Visit Diagnoses:  1. Displaced intertrochanteric fracture of right femur, initial encounter for closed fracture [facility])     Plan: Overall patient presents with chronic pain syndrome. Her pain is dystrophic and is not consistent with complications regarding the surgery. I defer her pain management to her pain management clinic. From orthopedic standpoint fracture appears to be healed. Handicap placard given today. Prescription for electric recliner chair. Follow-up as needed. I do recommend that she continue or resume her home health physical therapy. Total face to face encounter time was greater than 25 minutes and over half of this time was spent in counseling and/or coordination of care.  Follow-Up Instructions: Return if symptoms worsen or fail to improve.   Orders:  Orders Placed This Encounter  Procedures  . XR HIP UNILAT W OR W/O PELVIS 2-3 VIEWS RIGHT   No orders of the defined types were placed in this encounter.     Procedures: No procedures performed   Clinical Data: No additional findings.   Subjective: Chief Complaint  Patient presents with  . Right Hip - Pain    Patient is 6 months status post intramedullary fixation of right intertrochanteric fracture. She continues to complain of severe pain. She is currently in chronic pain management. She states the entire leg hurts.    Review of Systems  Constitutional: Negative.   HENT: Negative.   Eyes: Negative.   Respiratory: Negative.   Cardiovascular: Negative.   Endocrine: Negative.   Musculoskeletal: Negative.   Neurological: Negative.   Hematological: Negative.   Psychiatric/Behavioral: Negative.   All other systems reviewed  and are negative.    Objective: Vital Signs: There were no vitals taken for this visit.  Physical Exam  Constitutional: She is oriented to person, place, and time. She appears well-developed and well-nourished.  Pulmonary/Chest: Effort normal.  Neurological: She is alert and oriented to person, place, and time.  Skin: Skin is warm. Capillary refill takes less than 2 seconds.  Psychiatric: She has a normal mood and affect. Her behavior is normal. Judgment and thought content normal.  Nursing note and vitals reviewed.   Ortho Exam Right leg exam shows tenderness with gentle palpation throughout her leg but no anatomic pattern. Motor and sensation are grossly intact. Surgical scars are fully healed. Specialty Comments:  No specialty comments available.  Imaging: Xr Hip Unilat W Or W/o Pelvis 2-3 Views Right  Result Date: 02/28/2017 Healed right intertrochanteric hip fracture without complications.    PMFS History: Patient Active Problem List   Diagnosis Date Noted  . Chronic myofascial pain 02/12/2017  . Lumbar facet syndrome (Bilateral) (L>R) 02/12/2017  . Long term prescription benzodiazepine use 02/12/2017  . Opiate use 02/12/2017  . Disorder of skeletal system 02/12/2017  . Pharmacologic therapy 02/12/2017  . Problems influencing health status 02/12/2017  . 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 NSAID long-term use 02/12/2017  . DDD (degenerative disc disease), cervical 11/11/2016  . Lumbar facet arthropathy 11/11/2016  . 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 CAFL (chronic airflow limitation) (HCC)  11/07/2016  . Disc disorder 11/07/2016  . Depression, major, in partial remission ([place]) 11/07/2016  . 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 Bed wetting 11/07/2016  . Long term current use of opiate analgesic 11/07/2016  . Long term prescription opiate use 11/07/2016  . Chronic pain syndrome 11/07/2016  .  Chronic neck pain (Secondary Area of Pain) (Bilateral) (L>R) 11/07/2016  . Chronic hip pain (Right) 11/07/2016  . Rib pain 11/07/2016  . Age-related osteoporosis with current pathological fracture with routine healing 09/20/2016  . History of stroke 09/20/2016  . Major depression in remission ([place]) 09/20/2016  . Hip fracture ([place]) 09/13/2016  . Displaced intertrochanteric fracture of right femur, initial encounter for closed fracture ([place]) 09/13/2016  . Fall   . Chronic shoulder pain (Bilateral) 08/12/2016  . Status post total left knee replacement 04/04/2016  . Chronic shoulder pain (Left) 02/28/2016  . Hoarseness 01/12/2016  . Pharyngoesophageal dysphagia 01/12/2016  . Chronic low back pain [facility] Area of Pain) (Bilateral) (L>R) 10/26/2015  . Status post revision of total replacement of right knee 10/26/2015  . Spinal stenosis in cervical region 06/27/2015  . UTI (urinary tract infection) 06/25/2015  . Syncope, non cardiac 05/22/2015  . Leukopenia 10/07/2014  . Thrombocytopenia ([place]) 10/07/2014  . Hypoxia 10/05/2014  . Anxiety 06/15/2014  . Apoplectic vertigo 06/15/2014  . Duodenogastric reflux 06/15/2014  . Benign essential HTN 06/15/2014  . Infection of the upper respiratory tract 06/15/2014  . Hypercholesteremia 07/30/2013  . HTN (hypertension) 07/30/2013  . GERD (gastroesophageal reflux disease) 07/30/2013  . Dizziness and giddiness 06/18/2013  . Gait difficulty 05/03/2013  . Memory loss 05/03/2013  . Chronic knee pain (Primary Area of Pain) (Right) 09/10/2012   Past Medical History:  Diagnosis Date  . Anemia   . B12 deficiency   . Bacteremia 10/07/2014  . Bladder incontinence   . Blind left eye   . Cataract   . Cervical spine fracture ([place])   . Chest pain 07/29/2013  . Compression fracture    C7,  upper T spine -compression fx  . COPD (chronic obstructive pulmonary disease) ([place])   . DDD (degenerative disc disease), lumbar   . Depression    major  . Diffuse  myofascial pain syndrome 03/24/2015  . Fever 10/05/2014  . GERD (gastroesophageal reflux disease)   . Hiatal hernia   . Hyperlipidemia   . Hypertension   . 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 Macular degeneration   . Osteoarthritis   . Osteoporosis   . Reactive airway disease   . Rupture of bowel ([place])   . Sepsis ([place]) 10/08/2014  . Stroke [facility])    "mini stroke"  . Stroke ([place])   . Wrist pain, acute 09/10/2012    Family History  Problem Relation Age of Onset  . Heart failure Mother   . Heart attack Father     Past Surgical History:  Procedure Laterality Date  . ABDOMINAL HYSTERECTOMY    . ABDOMINAL SURGERY    . APPENDECTOMY    . BLADDER SURGERY    . CHOLECYSTECTOMY    . COLON SURGERY    . EYE SURGERY     cataracts  . INTRAMEDULLARY (IM) NAIL INTERTROCHANTERIC Right 09/13/2016   Procedure: INTRAMEDULLARY (IM) NAIL INTERTROCHANTRIC RIGHT;  Surgeon: Leandrew Koyanagi, MD;  Location: [facility];  Service: Orthopedics;  Laterality: Right;  . JOINT REPLACEMENT    . KNEE SURGERY    . OSTOMY    . [place]     Social History  Occupational History  . retired     n/a   Social History Main Topics  . Smoking status: Former Smoker    Packs/day: 1.50    Years: 30.00  . Smokeless tobacco: Never Used     Comment: quit 2013  . Alcohol use No  . Drug use: No  . Sexual activity: No

## 2017-03-03 ENCOUNTER — Ambulatory Visit (INDEPENDENT_AMBULATORY_CARE_PROVIDER_SITE_OTHER): Payer: Medicare Other | Admitting: Orthopaedic Surgery

## 2017-03-03 ENCOUNTER — Telehealth (INDEPENDENT_AMBULATORY_CARE_PROVIDER_SITE_OTHER): Payer: Self-pay | Admitting: Orthopaedic Surgery

## 2017-03-03 NOTE — Telephone Encounter (Signed)
yes

## 2017-03-03 NOTE — Telephone Encounter (Signed)
Gretchen from [facility] called requesting vo for the patient for the following:  2x for 5 weeks.  [phone].  Thank you.

## 2017-03-03 NOTE — Telephone Encounter (Signed)
Is this okay?

## 2017-03-03 NOTE — Telephone Encounter (Signed)
Called, Per Dr Erlinda Hong he approved orders

## 2017-03-05 ENCOUNTER — Ambulatory Visit: Payer: Medicare Other | Attending: Pain Medicine | Admitting: Pain Medicine

## 2017-03-05 ENCOUNTER — Encounter: Payer: Self-pay | Admitting: Pain Medicine

## 2017-03-05 VITALS — BP 132/68 | HR 63 | Temp 98.4°F | Resp 16 | Ht 64.0 in | Wt 180.0 lb

## 2017-03-05 DIAGNOSIS — Z79899 Other long term (current) drug therapy: Secondary | ICD-10-CM | POA: Diagnosis not present

## 2017-03-05 DIAGNOSIS — Z90710 Acquired absence of both cervix and uterus: Secondary | ICD-10-CM | POA: Insufficient documentation

## 2017-03-05 DIAGNOSIS — M159 Polyosteoarthritis, unspecified: Secondary | ICD-10-CM

## 2017-03-05 DIAGNOSIS — J449 Chronic obstructive pulmonary disease, unspecified: Secondary | ICD-10-CM | POA: Insufficient documentation

## 2017-03-05 DIAGNOSIS — R42 Dizziness and giddiness: Secondary | ICD-10-CM | POA: Insufficient documentation

## 2017-03-05 DIAGNOSIS — Z888 Allergy status to other drugs, medicaments and biological substances status: Secondary | ICD-10-CM | POA: Insufficient documentation

## 2017-03-05 DIAGNOSIS — N390 Urinary tract infection, site not specified: Secondary | ICD-10-CM | POA: Insufficient documentation

## 2017-03-05 DIAGNOSIS — M542 Cervicalgia: Secondary | ICD-10-CM

## 2017-03-05 DIAGNOSIS — K5903 Drug induced constipation: Secondary | ICD-10-CM | POA: Diagnosis not present

## 2017-03-05 DIAGNOSIS — N189 Chronic kidney disease, unspecified: Secondary | ICD-10-CM | POA: Diagnosis not present

## 2017-03-05 DIAGNOSIS — G894 Chronic pain syndrome: Secondary | ICD-10-CM | POA: Diagnosis not present

## 2017-03-05 DIAGNOSIS — M7918 Myalgia, other site: Secondary | ICD-10-CM | POA: Diagnosis not present

## 2017-03-05 DIAGNOSIS — M5030 Other cervical disc degeneration, unspecified cervical region: Secondary | ICD-10-CM | POA: Insufficient documentation

## 2017-03-05 DIAGNOSIS — Z96653 Presence of artificial knee joint, bilateral: Secondary | ICD-10-CM | POA: Insufficient documentation

## 2017-03-05 DIAGNOSIS — R32 Unspecified urinary incontinence: Secondary | ICD-10-CM | POA: Diagnosis not present

## 2017-03-05 DIAGNOSIS — R490 Dysphonia: Secondary | ICD-10-CM | POA: Insufficient documentation

## 2017-03-05 DIAGNOSIS — G8929 Other chronic pain: Secondary | ICD-10-CM

## 2017-03-05 DIAGNOSIS — H5462 Unqualified visual loss, left eye, normal vision right eye: Secondary | ICD-10-CM | POA: Diagnosis not present

## 2017-03-05 DIAGNOSIS — R1314 Dysphagia, pharyngoesophageal phase: Secondary | ICD-10-CM | POA: Insufficient documentation

## 2017-03-05 DIAGNOSIS — F329 Major depressive disorder, single episode, unspecified: Secondary | ICD-10-CM | POA: Insufficient documentation

## 2017-03-05 DIAGNOSIS — E785 Hyperlipidemia, unspecified: Secondary | ICD-10-CM | POA: Insufficient documentation

## 2017-03-05 DIAGNOSIS — T402X5A Adverse effect of other opioids, initial encounter: Secondary | ICD-10-CM

## 2017-03-05 DIAGNOSIS — F419 Anxiety disorder, unspecified: Secondary | ICD-10-CM | POA: Insufficient documentation

## 2017-03-05 DIAGNOSIS — Z882 Allergy status to sulfonamides status: Secondary | ICD-10-CM | POA: Insufficient documentation

## 2017-03-05 DIAGNOSIS — M1990 Unspecified osteoarthritis, unspecified site: Secondary | ICD-10-CM | POA: Diagnosis not present

## 2017-03-05 DIAGNOSIS — M25561 Pain in right knee: Secondary | ICD-10-CM | POA: Diagnosis not present

## 2017-03-05 DIAGNOSIS — J069 Acute upper respiratory infection, unspecified: Secondary | ICD-10-CM | POA: Insufficient documentation

## 2017-03-05 DIAGNOSIS — M5136 Other intervertebral disc degeneration, lumbar region: Secondary | ICD-10-CM

## 2017-03-05 DIAGNOSIS — M792 Neuralgia and neuritis, unspecified: Secondary | ICD-10-CM

## 2017-03-05 DIAGNOSIS — Z87891 Personal history of nicotine dependence: Secondary | ICD-10-CM | POA: Diagnosis not present

## 2017-03-05 DIAGNOSIS — M25512 Pain in left shoulder: Secondary | ICD-10-CM | POA: Insufficient documentation

## 2017-03-05 DIAGNOSIS — H3530 Unspecified macular degeneration: Secondary | ICD-10-CM | POA: Diagnosis not present

## 2017-03-05 DIAGNOSIS — K219 Gastro-esophageal reflux disease without esophagitis: Secondary | ICD-10-CM | POA: Insufficient documentation

## 2017-03-05 DIAGNOSIS — M25551 Pain in right hip: Secondary | ICD-10-CM | POA: Insufficient documentation

## 2017-03-05 DIAGNOSIS — Z8249 Family history of ischemic heart disease and other diseases of the circulatory system: Secondary | ICD-10-CM | POA: Insufficient documentation

## 2017-03-05 DIAGNOSIS — M150 Primary generalized (osteo)arthritis: Secondary | ICD-10-CM | POA: Diagnosis not present

## 2017-03-05 DIAGNOSIS — Z880 Allergy status to penicillin: Secondary | ICD-10-CM | POA: Insufficient documentation

## 2017-03-05 DIAGNOSIS — M545 Low back pain, unspecified: Secondary | ICD-10-CM

## 2017-03-05 DIAGNOSIS — I129 Hypertensive chronic kidney disease with stage 1 through stage 4 chronic kidney disease, or unspecified chronic kidney disease: Secondary | ICD-10-CM | POA: Insufficient documentation

## 2017-03-05 DIAGNOSIS — E538 Deficiency of other specified B group vitamins: Secondary | ICD-10-CM | POA: Insufficient documentation

## 2017-03-05 DIAGNOSIS — Z885 Allergy status to narcotic agent status: Secondary | ICD-10-CM | POA: Insufficient documentation

## 2017-03-05 DIAGNOSIS — Z9049 Acquired absence of other specified parts of digestive tract: Secondary | ICD-10-CM | POA: Insufficient documentation

## 2017-03-05 DIAGNOSIS — M8949 Other hypertrophic osteoarthropathy, multiple sites: Secondary | ICD-10-CM

## 2017-03-05 DIAGNOSIS — Z8673 Personal history of transient ischemic attack (TIA), and cerebral infarction without residual deficits: Secondary | ICD-10-CM | POA: Insufficient documentation

## 2017-03-05 DIAGNOSIS — R413 Other amnesia: Secondary | ICD-10-CM | POA: Insufficient documentation

## 2017-03-05 DIAGNOSIS — Z9181 History of falling: Secondary | ICD-10-CM | POA: Insufficient documentation

## 2017-03-05 DIAGNOSIS — G629 Polyneuropathy, unspecified: Secondary | ICD-10-CM | POA: Insufficient documentation

## 2017-03-05 DIAGNOSIS — M5134 Other intervertebral disc degeneration, thoracic region: Secondary | ICD-10-CM

## 2017-03-05 DIAGNOSIS — M488X6 Other specified spondylopathies, lumbar region: Secondary | ICD-10-CM | POA: Insufficient documentation

## 2017-03-05 DIAGNOSIS — M25511 Pain in right shoulder: Secondary | ICD-10-CM | POA: Insufficient documentation

## 2017-03-05 DIAGNOSIS — F1190 Opioid use, unspecified, uncomplicated: Secondary | ICD-10-CM

## 2017-03-05 DIAGNOSIS — Z79891 Long term (current) use of opiate analgesic: Secondary | ICD-10-CM | POA: Diagnosis not present

## 2017-03-05 DIAGNOSIS — D72819 Decreased white blood cell count, unspecified: Secondary | ICD-10-CM | POA: Insufficient documentation

## 2017-03-05 DIAGNOSIS — R55 Syncope and collapse: Secondary | ICD-10-CM | POA: Insufficient documentation

## 2017-03-05 DIAGNOSIS — Z789 Other specified health status: Secondary | ICD-10-CM | POA: Insufficient documentation

## 2017-03-05 DIAGNOSIS — M51369 Other intervertebral disc degeneration, lumbar region without mention of lumbar back pain or lower extremity pain: Secondary | ICD-10-CM

## 2017-03-05 DIAGNOSIS — D696 Thrombocytopenia, unspecified: Secondary | ICD-10-CM | POA: Insufficient documentation

## 2017-03-05 DIAGNOSIS — M8000XD Age-related osteoporosis with current pathological fracture, unspecified site, subsequent encounter for fracture with routine healing: Secondary | ICD-10-CM | POA: Insufficient documentation

## 2017-03-05 DIAGNOSIS — Z791 Long term (current) use of non-steroidal anti-inflammatories (NSAID): Secondary | ICD-10-CM | POA: Insufficient documentation

## 2017-03-05 DIAGNOSIS — M25562 Pain in left knee: Secondary | ICD-10-CM | POA: Diagnosis not present

## 2017-03-05 DIAGNOSIS — M4802 Spinal stenosis, cervical region: Secondary | ICD-10-CM | POA: Insufficient documentation

## 2017-03-05 MED ORDER — OXYCODONE HCL 5 MG PO TABS: 5.0000 mg | ORAL_TABLET | Freq: Three times a day (TID) | ORAL | 0 refills | Status: DC | PRN

## 2017-03-05 MED ORDER — GABAPENTIN 400 MG PO CAPS: 400.0000 mg | ORAL_CAPSULE | Freq: Four times a day (QID) | ORAL | 2 refills | Status: DC

## 2017-03-05 MED ORDER — DICLOFENAC SODIUM 1 % TD GEL: 2.0000 g | Freq: Four times a day (QID) | TRANSDERMAL | 2 refills | Status: DC

## 2017-03-05 MED ORDER — BACLOFEN 10 MG PO TABS: 10.0000 mg | ORAL_TABLET | Freq: Every day | ORAL | 2 refills | Status: DC

## 2017-03-05 NOTE — Progress Notes (Signed)
Patient's Name: [name]  MRN: [id number]  Referring Provider: Wenda Low, MD  DOB: 03-11-1941  PCP: Wenda Low, MD  DOS: 03/05/2017  Note by: Gaspar Cola, MD  Service setting: Ambulatory outpatient  Specialty: Interventional Pain Management  Location: [facility] (AMB) Pain Management Facility    Patient type: Established   Primary Reason(s) for Visit: Encounter for prescription drug management. (Level of risk: moderate)  CC: Hip Pain (right)  HPI  [name] is a 76 y.o. year old, female patient, who comes today for a medication management evaluation. 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[place]); Hypertensive kidney disease, malignant; Chronic low back pain (Tertiary Area of Pain) (Bilateral) (L>R); CAFL (chronic airflow limitation) ([place]); Apoplectic vertigo; Duodenogastric reflux; History of stroke; Hoarseness; Benign essential HTN; Chronic knee pain (Primary Area of Pain) (Right); Depression, major, in partial remission ([place]); Major depression in remission ([place]); Involutional osteoporosis; Pharyngoesophageal dysphagia; 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); Hx of total knee replacement (Bilateral); Infection of the upper respiratory tract; Bed wetting; UTI (urinary tract infection); Long term current use of opiate analgesic; Long term prescription opiate use; Chronic pain syndrome; Chronic neck pain (Secondary Area of Pain) (Bilateral) (L>R); Chronic hip pain (Right); Rib pain; DDD (degenerative disc disease), cervical; Lumbar  facet arthropathy; Chronic myofascial pain; Lumbar facet syndrome (Bilateral) (L>R); Long term prescription benzodiazepine use; Opiate use; Disorder of skeletal system; Pharmacologic therapy; Problems influencing health status; Opioid-induced constipation (OIC); NSAID long-term use; DDD (degenerative disc disease), lumbar; Chronic pain of left knee (Bilateral) (R>L); Osteoarthritis; Chronic musculoskeletal pain; and Neurogenic pain on her problem list. Her primarily concern today is the Hip Pain (right)  Pain Assessment: Location: Right Hip Radiating: right hip goes down the side of leg to the knee Onset: More than a month ago Duration: Chronic pain Quality: Constant, Radiating, Sharp Severity: 10-Worst pain ever/10 (self-reported pain score)  Note: Reported level is inconsistent with clinical observations. Clinically the patient looks like a 4/10 Information on the proper use of the pain scale provided to the patient today. When using our objective Pain Scale, levels between 6 and 10/10 are said to belong in an emergency room, as it progressively worsens from a 6/10, described as severely limiting, requiring emergency care not usually available at an outpatient pain management facility. At a 6/10 level, communication becomes difficult and requires great effort. Assistance to reach the emergency department may be required. Facial flushing and profuse sweating along with potentially dangerous increases in heart rate and blood pressure will be evident. Effect on ADL: pace self Timing: Constant Modifying factors: nothing is helping the pain  [name] was last scheduled for an appointment on 02/14/2017 for medication management. During today's appointment we reviewed [name]'s chronic pain status, as well as her outpatient medication regimen.  The patient  reports that she does not use drugs. Her body mass index is 30.9 kg/m. Today if patient has a history of a bilateral total knee replacement. She  continues to have pain despite that replacement. She also has some hip pain that could be secondary to a recent fracture on the right side but the combination could also represent radicular symptoms from pathology within her thoracic or  lumbar spine. She has a history of multiple compression fractures and osteoporosis. At this point, we will go ahead and update her lumbar and thoracic MRI since the last set was done around 2016. It is very likely that some of the pain that she is experiencing may be coming from this area. To rule out the possibility that he may be coming from the knees, we will bring patient back for a bilateral diagnostic genicular nerve block. If she gets good relief, she may be a good candidate for RFA.  Time Note: Greater than 50% of the 40 minute(s) of face-to-face time spent with [name], was spent in counseling/coordination of care regarding: the appropriate use of the pain scale, [name]'s primary cause of pain, the treatment plan, treatment alternatives, the risks and possible complications of proposed treatment, medication side effects, the opioid analgesic risks and possible complications, realistic expectations, the goals of pain management (increased in functionality) and the need to collect and read the AVS material.  Further details on both, my assessment(s), as well as the proposed treatment plan, please see below.  Controlled Substance Pharmacotherapy Assessment REMS (Risk Evaluation and Mitigation Strategy)  Analgesic: Oxycodone IR 5 mg 1 tablet by mouth every 8 hours (15 mg/day oxycodone) (22.5 MME/day) MME/day: 22.5 mg/day.  Nicole Millard, RN  03/05/2017  1:48 PM  Sign at close encounter Nursing Pain Medication Assessment:  Safety precautions to be maintained throughout the outpatient stay will include: orient to surroundings, keep bed in low position, maintain call bell within reach at all times, provide assistance with transfer out of bed and ambulation.   Medication Inspection Compliance: Pill count conducted under aseptic conditions, in front of the patient. Neither the pills nor the bottle was removed from the patient's sight at any time. Once count was completed pills were immediately returned to the patient in their original bottle.  Medication: Oxycodone IR Pill/Patch Count: 37 of 90 pills remain Pill/Patch Appearance: Markings consistent with prescribed medication Bottle Appearance: Standard pharmacy container. Clearly labeled. Filled Date: 09 / 19 / 2018 Last Medication intake:  Today   Pharmacokinetics: Liberation and absorption (onset of action): WNL Distribution (time to peak effect): WNL Metabolism and excretion (duration of action): WNL         Pharmacodynamics: Desired effects: Analgesia: Ms. Bastedo reports >50% benefit. Functional ability: Patient reports that medication allows her to accomplish basic ADLs Clinically meaningful improvement in function (CMIF): Sustained CMIF goals met Perceived effectiveness: Described as relatively effective, allowing for increase in activities of daily living (ADL) Undesirable effects: Side-effects or Adverse reactions: None reported Monitoring: [place] PMP: Online review of the past 91-monthperiod conducted. Compliant with practice rules and regulations Last UDS on record: Summary  Date Value Ref Range Status  11/07/2016 FINAL  Final    Comment:    ==================================================================== TOXASSURE COMP DRUG ANALYSIS,UR ==================================================================== Test                             Result       Flag       Units Drug Present and Declared for Prescription Verification   Alprazolam                     95           EXPECTED   ng/mg creat   Alpha-hydroxyalprazolam        71  EXPECTED   ng/mg creat    Source of alprazolam is a scheduled prescription medication.    Alpha-hydroxyalprazolam is an expected metabolite of  alprazolam.   Hydrocodone                    946          EXPECTED   ng/mg creat   Hydromorphone                  53           EXPECTED   ng/mg creat   Dihydrocodeine                 75           EXPECTED   ng/mg creat   Norhydrocodone                 1955         EXPECTED   ng/mg creat    Sources of hydrocodone include scheduled prescription    medications. Hydromorphone, dihydrocodeine and norhydrocodone are    expected metabolites of hydrocodone. Hydromorphone and    dihydrocodeine are also available as scheduled prescription    medications.   Oxycodone                      182          EXPECTED   ng/mg creat   Noroxycodone                   1913         EXPECTED   ng/mg creat    Sources of oxycodone include scheduled prescription medications.    Noroxycodone is an expected metabolite of oxycodone.   Gabapentin                     PRESENT      EXPECTED   Methocarbamol                  PRESENT      EXPECTED   Citalopram                     PRESENT      EXPECTED   Desmethylcitalopram            PRESENT      EXPECTED    Desmethylcitalopram is an expected metabolite of citalopram or    the enantiomeric form, escitalopram.   Trazodone                      PRESENT      EXPECTED   1,3 chlorophenyl piperazine    PRESENT      EXPECTED    1,3-chlorophenyl piperazine is an expected metabolite of    trazodone.   Acetaminophen                  PRESENT      EXPECTED Drug Present not Declared for Prescription Verification   Cyclobenzaprine                PRESENT      UNEXPECTED Drug Absent but Declared for Prescription Verification   Baclofen                       Not Detected UNEXPECTED   Lidocaine  Not Detected UNEXPECTED    Lidocaine, as indicated in the declared medication list, is not    always detected even when used as directed. ==================================================================== Test                      Result    Flag   Units      Ref Range    Creatinine              112              mg/dL      >=20 ==================================================================== Declared Medications:  The flagging and interpretation on this report are based on the  following declared medications.  Unexpected results may arise from  inaccuracies in the declared medications.  **Note: The testing scope of this panel includes these medications:  Alprazolam  Baclofen  Citalopram (Celexa)  Gabapentin  Hydrocodone (Hydrocodone-Acetaminophen)  Methocarbamol  Oxycodone (Oxycodone Acetaminophen)  Trazodone  **Note: The testing scope of this panel does not include small to  moderate amounts of these reported medications:  Acetaminophen  Acetaminophen (Hydrocodone-Acetaminophen)  Acetaminophen (Oxycodone Acetaminophen)  Lidocaine  **Note: The testing scope of this panel does not include following  reported medications:  Albuterol  Amlodipine  Buspirone  Ciprofloxacin  Cyanocobalamin  Docusate  Enoxaparin  Iron  Lovastatin  Meloxicam  Methylprednisolone  Naloxegol (Movantik)  Omeprazole (Nexium)  Ondansetron  Polyethylene Glycol  Sennosides  Tamsulosin ==================================================================== For clinical consultation, please call [phone]. ====================================================================    UDS interpretation: Compliant          Medication Assessment Form: Reviewed. Patient indicates being compliant with therapy Treatment compliance: Compliant Risk Assessment Profile: Aberrant behavior: See prior evaluations. None observed or detected today Comorbid factors increasing risk of overdose: See prior notes. No additional risks detected today Risk of substance use disorder (SUD): Low     Opioid Risk Tool - 02/12/17 0901      Family History of Substance Abuse   Alcohol Negative   Illegal Drugs Negative   Rx Drugs Negative     Personal History of Substance Abuse   Alcohol  Negative   Illegal Drugs Negative   Rx Drugs Negative     Age   Age between 59-45 years  No     History of Preadolescent Sexual Abuse   History of Preadolescent Sexual Abuse Negative or Female     Psychological Disease   Psychological Disease Negative   Depression Positive     Total Score   Opioid Risk Tool Scoring 1   Opioid Risk Interpretation Low Risk     ORT Scoring interpretation table:  Score <3 = Low Risk for SUD  Score between 4-7 = Moderate Risk for SUD  Score >8 = High Risk for Opioid Abuse   Risk Mitigation Strategies:  Patient Counseling: Covered Patient-Prescriber Agreement (PPA): Present and active  Notification to other healthcare providers: Done  Pharmacologic Plan: No change in therapy, at this time  Laboratory Chemistry  Inflammation Markers (CRP: Acute Phase) (ESR: Chronic Phase) Lab Results  Component Value Date   CRP <0.8 11/19/2016   ESRSEDRATE 20 11/19/2016                 Renal Function Markers Lab Results  Component Value Date   BUN 7 (L) 12/04/2016   CREATININE 1.09 (H) 12/04/2016   GFRAA 57 (L) 12/04/2016   GFRNONAA 50 (L) 12/04/2016  Hepatic Function Markers Lab Results  Component Value Date   AST 19 12/04/2016   ALT 15 12/04/2016   ALBUMIN 4.0 12/04/2016   ALKPHOS 104 12/04/2016                 Electrolytes Lab Results  Component Value Date   NA 145 (H) 12/04/2016   K 4.0 12/04/2016   CL 107 (H) 12/04/2016   CALCIUM 9.4 12/04/2016   MG 2.0 11/19/2016                 Neuropathy Markers Lab Results  Component Value Date   VITAMINB12 1,888 (H) 11/19/2016                 Bone Pathology Markers Lab Results  Component Value Date   ALKPHOS 104 12/04/2016   25OHVITD1 37 11/19/2016   25OHVITD2 <1.0 11/19/2016   25OHVITD3 37 11/19/2016   CALCIUM 9.4 12/04/2016                 Coagulation Parameters Lab Results  Component Value Date   INR 1.00 09/13/2016   LABPROT 13.2 09/13/2016   APTT 34  01/04/2011   PLT 263 12/04/2016                 Cardiovascular Markers Lab Results  Component Value Date   BNP 60.2 01/23/2016   HGB 14.7 12/04/2016   HCT 44.8 12/04/2016                 Note: Lab results reviewed.  Recent Diagnostic Imaging Results  XR HIP UNILAT W OR W/O PELVIS 2-3 VIEWS RIGHT Healed right intertrochanteric hip fracture without complications.  Complexity Note: Imaging results reviewed. Results shared with Ms. Sculley, using Layman's terms.                         Meds   Current Outpatient Prescriptions:  .  albuterol (PROVENTIL HFA;VENTOLIN HFA) 108 (90 BASE) MCG/ACT inhaler, Inhale 1 puff into the lungs every 6 (six) hours as needed for wheezing or shortness of breath., Disp: , Rfl:  .  albuterol (PROVENTIL) (2.5 MG/3ML) 0.083% nebulizer solution, Take 2.5 mg by nebulization every 6 (six) hours as needed for wheezing or shortness of breath., Disp: , Rfl:  .  ALPRAZolam (XANAX) 1 MG tablet, Take 1 mg by mouth at bedtime as needed. , Disp: , Rfl:  .  amLODipine (NORVASC) 5 MG tablet, Take 5 mg by mouth daily. , Disp: , Rfl:  .  baclofen (LIORESAL) 10 MG tablet, Take 1 tablet (10 mg total) by mouth daily., Disp: 30 tablet, Rfl: 2 .  Calcium-Vitamin D-Vitamin K (CALCIUM SOFT CHEWS PO), Take by mouth daily at 6 (six) AM., Disp: , Rfl:  .  citalopram (CELEXA) 20 MG tablet, Take 20 mg by mouth daily. , Disp: , Rfl:  .  diclofenac sodium (VOLTAREN) 1 % GEL, Apply 2 g topically 4 (four) times daily., Disp: 100 g, Rfl: 2 .  diphenhydrAMINE (BENADRYL) 25 MG tablet, Take 25 mg by mouth as needed., Disp: , Rfl:  .  docusate sodium (COLACE) 100 MG capsule, Take 100 mg by mouth as needed for mild constipation., Disp: , Rfl:  .  esomeprazole (NEXIUM) 40 MG capsule, Take 40 mg by mouth 2 (two) times daily before a meal. , Disp: , Rfl:  .  Fexofenadine HCl (MUCINEX ALLERGY PO), Take by mouth as needed., Disp: , Rfl:  .  gabapentin (NEURONTIN) 400 MG  capsule, Take 1 capsule (400  mg total) by mouth 4 (four) times daily., Disp: 120 capsule, Rfl: 2 .  GINKGO BILOBA PO, Take by mouth daily at 6 (six) AM., Disp: , Rfl:  .  lovastatin (MEVACOR) 10 MG tablet, Take 10 mg by mouth daily., Disp: , Rfl:  .  meloxicam (MOBIC) 15 MG tablet, Take 15 mg by mouth every evening., Disp: , Rfl:  .  naloxegol oxalate (MOVANTIK) 25 MG TABS tablet, Take 25 mg by mouth daily., Disp: , Rfl:  .  omega-3 fish oil (MAXEPA) 1000 MG CAPS capsule, Take by mouth daily at 6 (six) AM., Disp: , Rfl:  .  ondansetron (ZOFRAN) 4 MG tablet, Take 4 mg by mouth every 8 (eight) hours as needed for nausea. , Disp: , Rfl:  .  [START ON 03/14/2017] oxyCODONE (OXY IR/ROXICODONE) 5 MG immediate release tablet, Take 1 tablet (5 mg total) by mouth every 8 (eight) hours as needed for severe pain., Disp: 90 tablet, Rfl: 0 .  polyethylene glycol powder (GLYCOLAX/MIRALAX) powder, , Disp: , Rfl:  .  PROLIA 60 MG/ML SOLN injection, Inject into the skin every 6 (six) months. , Disp: , Rfl:  .  promethazine (PHENERGAN) 12.5 MG suppository, Place 12.5 mg rectally as needed for nausea or vomiting., Disp: , Rfl:  .  senna (SENOKOT) 8.6 MG TABS tablet, Take 2 tablets by mouth. constipation, Disp: , Rfl:  .  traZODone (DESYREL) 100 MG tablet, Take 50 mg by mouth at bedtime. , Disp: , Rfl:   ROS  Constitutional: Denies any fever or chills Gastrointestinal: No reported hemesis, hematochezia, vomiting, or acute GI distress Musculoskeletal: Denies any acute onset joint swelling, redness, loss of ROM, or weakness Neurological: No reported episodes of acute onset apraxia, aphasia, dysarthria, agnosia, amnesia, paralysis, loss of coordination, or loss of consciousness  Allergies  Ms. Delahunt is allergic to ampicillin-sulbactam sodium; ampicillin; toradol [ketorolac tromethamine]; tramadol; ambien [zolpidem tartrate]; cephalexin; ketorolac; percocet [oxycodone-acetaminophen]; sulfamethoxazole-trimethoprim; and tape.  [place]  Drug: Ms.  Hattabaugh  reports that she does not use drugs. Alcohol:  reports that she does not drink alcohol. Tobacco:  reports that she has quit smoking. She has a 45.00 pack-year smoking history. She has never used smokeless tobacco. Medical:  has a past medical history of Anemia; B12 deficiency; Bacteremia (10/07/2014); Bladder incontinence; Blind left eye; Cataract; Cervical spine fracture ([place]); Chest pain (07/29/2013); Compression fracture; COPD (chronic obstructive pulmonary disease) ([place]); DDD (degenerative disc disease), lumbar; Depression; Diffuse myofascial pain syndrome (03/24/2015); Fever (10/05/2014); GERD (gastroesophageal reflux disease); Hiatal hernia; Hyperlipidemia; Hypertension; Hypertensive kidney disease, malignant (11/07/2016); Macular degeneration; Osteoarthritis; Osteoporosis; Reactive airway disease; Rupture of bowel ([place]); Sepsis ([place]) (10/08/2014); Stroke [facility]); Stroke [facility]); and Wrist pain, acute (09/10/2012). Surgical: Ms. Garbett  has a past surgical history that includes Abdominal surgery; Colon surgery; Joint replacement; Appendectomy; Bladder surgery; Rectocele repair; Knee surgery; Cholecystectomy; Abdominal hysterectomy; Ostomy; Eye surgery; Intramedullary (im) nail intertrochanteric (Right, 09/13/2016); and Hip surgery (Right, 08/2016). Family: family history includes Heart attack in her father; Heart failure in her mother.  Constitutional Exam  General appearance: Well nourished, well developed, and well hydrated. In no apparent acute distress Vitals:   03/05/17 1328  BP: 132/68  Pulse: 63  Resp: 16  Temp: 98.4 F (36.9 C)  SpO2: 96%  Weight: 180 lb (81.6 kg)  Height: '5\' 4"'  (1.626 m)   BMI Assessment: Estimated body mass index is 30.9 kg/m as calculated from the following:   Height as of this encounter: '5\' 4"'  (  1.626 m).   Weight as of this encounter: 180 lb (81.6 kg).  BMI interpretation table: BMI level Category Range association with higher incidence of chronic pain  <18  kg/m2 Underweight   18.5-24.9 kg/m2 Ideal body weight   25-29.9 kg/m2 Overweight Increased incidence by 20%  30-34.9 kg/m2 Obese (Class I) Increased incidence by 68%  35-39.9 kg/m2 Severe obesity (Class II) Increased incidence by 136%  >40 kg/m2 Extreme obesity (Class III) Increased incidence by 254%   BMI Readings from Last 4 Encounters:  03/05/17 30.90 kg/m  02/12/17 29.95 kg/m  12/04/16 29.72 kg/m  11/07/16 30.79 kg/m   Wt Readings from Last 4 Encounters:  03/05/17 180 lb (81.6 kg)  02/12/17 180 lb (81.6 kg)  12/04/16 178 lb 9.6 oz (81 kg)  11/07/16 185 lb (83.9 kg)  Psych/Mental status: Alert, oriented x 3 (person, place, & time)       Eyes: PERLA Respiratory: No evidence of acute respiratory distress  Cervical Spine Area Exam  Skin & Axial Inspection: No masses, redness, edema, swelling, or associated skin lesions Alignment: Symmetrical Functional ROM: Unrestricted ROM      Stability: No instability detected Muscle Tone/Strength: Functionally intact. No obvious neuro-muscular anomalies detected. Sensory (Neurological): Unimpaired Palpation: No palpable anomalies              Upper Extremity (UE) Exam    Side: Right upper extremity  Side: Left upper extremity  Skin & Extremity Inspection: Skin color, temperature, and hair growth are WNL. No peripheral edema or cyanosis. No masses, redness, swelling, asymmetry, or associated skin lesions. No contractures.  Skin & Extremity Inspection: Skin color, temperature, and hair growth are WNL. No peripheral edema or cyanosis. No masses, redness, swelling, asymmetry, or associated skin lesions. No contractures.  Functional ROM: Unrestricted ROM          Functional ROM: Unrestricted ROM          Muscle Tone/Strength: Functionally intact. No obvious neuro-muscular anomalies detected.  Muscle Tone/Strength: Functionally intact. No obvious neuro-muscular anomalies detected.  Sensory (Neurological): Unimpaired          Sensory  (Neurological): Unimpaired          Palpation: No palpable anomalies              Palpation: No palpable anomalies              Specialized Test(s): Deferred         Specialized Test(s): Deferred          Thoracic Spine Area Exam  Skin & Axial Inspection: No masses, redness, or swelling Alignment: Symmetrical Functional ROM: Unrestricted ROM Stability: No instability detected Muscle Tone/Strength: Functionally intact. No obvious neuro-muscular anomalies detected. Sensory (Neurological): Unimpaired Muscle strength & Tone: No palpable anomalies  Lumbar Spine Area Exam  Skin & Axial Inspection: No masses, redness, or swelling Alignment: Symmetrical Functional ROM: Unrestricted ROM      Stability: No instability detected Muscle Tone/Strength: Functionally intact. No obvious neuro-muscular anomalies detected. Sensory (Neurological): Unimpaired Palpation: No palpable anomalies       Provocative Tests: Lumbar Hyperextension and rotation test: evaluation deferred today       Lumbar Lateral bending test: evaluation deferred today       Patrick's Maneuver: evaluation deferred today                    Gait & Posture Assessment  Ambulation: Unassisted Gait: Relatively normal for age and body habitus Posture: WNL   Lower Extremity  Exam    Side: Right lower extremity  Side: Left lower extremity  Skin & Extremity Inspection: Skin color, temperature, and hair growth are WNL. No peripheral edema or cyanosis. No masses, redness, swelling, asymmetry, or associated skin lesions. No contractures.  Skin & Extremity Inspection: Skin color, temperature, and hair growth are WNL. No peripheral edema or cyanosis. No masses, redness, swelling, asymmetry, or associated skin lesions. No contractures.  Functional ROM: Unrestricted ROM          Functional ROM: Unrestricted ROM          Muscle Tone/Strength: Functionally intact. No obvious neuro-muscular anomalies detected.  Muscle Tone/Strength: Functionally  intact. No obvious neuro-muscular anomalies detected.  Sensory (Neurological): Unimpaired  Sensory (Neurological): Unimpaired  Palpation: No palpable anomalies  Palpation: No palpable anomalies   Assessment  Primary Diagnosis & Pertinent Problem List: The primary encounter diagnosis was Chronic knee pain (Primary Area of Pain) (Right). Diagnoses of Chronic neck pain (Secondary Area of Pain) (Bilateral) (L>R), Chronic low back pain (Tertiary Area of Pain) (Bilateral) (L>R), Chronic pain syndrome, Long term current use of opiate analgesic, Long term prescription opiate use, Opiate use, Opioid-induced constipation (OIC), Chronic pain of left knee (Bilateral) (R>L), Other intervertebral disc degeneration, lumbar region, Other intervertebral disc degeneration, thoracic region, Osteoarthritis, Chronic musculoskeletal pain, and Neurogenic pain were also pertinent to this visit.  Status Diagnosis  Controlled Controlled Controlled 1. Chronic knee pain (Primary Area of Pain) (Right)   2. Chronic neck pain (Secondary Area of Pain) (Bilateral) (L>R)   3. Chronic low back pain [facility] Area of Pain) (Bilateral) (L>R)   4. Chronic pain syndrome   5. Long term current use of opiate analgesic   6. Long term prescription opiate use   7. Opiate use   8. Opioid-induced constipation (OIC)   9. Chronic pain of left knee (Bilateral) (R>L)   10. Other intervertebral disc degeneration, lumbar region   11. Other intervertebral disc degeneration, thoracic region   12. Osteoarthritis   13. Chronic musculoskeletal pain   14. 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 oxyCODONE (OXY IR/ROXICODONE) 5 MG immediate release tablet    Sig: Take 1 tablet (5 mg total) by mouth every 8 (eight) hours as needed for severe pain.    Dispense:  90 tablet    Refill:  0    Do not place this medication, or any other prescription from our practice, on "Automatic Refill". Patient may have prescription filled one day early if pharmacy is closed on scheduled refill date. Do not fill until: 03/14/17 To last until: 04/13/17  . diclofenac sodium (VOLTAREN) 1 % GEL    Sig: Apply 2 g topically 4 (four) times daily.    Dispense:  100 g    Refill:  2    Do not place medication on "Automatic Refill". Fill one day early if pharmacy is closed on scheduled refill date.  . baclofen (LIORESAL) 10 MG tablet    Sig: Take 1 tablet (10 mg total) by mouth daily.    Dispense:  30 tablet    Refill:  2  .  gabapentin (NEURONTIN) 400 MG capsule    Sig: Take 1 capsule (400 mg total) by mouth 4 (four) times daily.    Dispense:  120 capsule    Refill:  2    Do not place medication on "Automatic Refill". Fill one day early if pharmacy is closed on scheduled refill date.   New Prescriptions   No medications on file   Medications administered today: Ms. Morganti had no medications administered during this visit.   Procedure Orders     GENICULAR NERVE BLOCK  Lab Orders     ToxASSURE Select 13 (MW), Urine  Imaging Orders     MR LUMBAR SPINE WO CONTRAST     MR THORACIC SPINE WO CONTRAST Referral Orders  No referral(s) requested today   Interventional management options: Planned, scheduled, and/or pending:   Diagnostic bilateral genicular nerve block  with local anesthetic and steroid    Considering:   Diagnostic right-sided intra-articular knee joint injection  Possible series of 5 right-sided intra-articular Hyalgan knee injections  Diagnostic right-sided genicular nerve block  Possible right-sided genicular nerve  RFA  Diagnostic left cervical epidural steroid injection  Diagnostic bilateral cervical facet blocks  Diagnostic bilateral intra-articular shoulder joint injection  Diagnostic bilateral suprascapular nerve block  Possible bilateral suprascapular nerve RFA  Diagnostic bilateral lumbar facet block  Possible bilateral lumbar facet RFA    Palliative PRN treatment(s):   None at this time   Provider-requested follow-up: Return for Procedure (w/ sedation): (B) Genicular Nerve block.  Future Appointments Date Time Provider Gary  06/10/2017 11:30 AM Penumalli, Earlean Polka, MD [facility] None   Primary Care Physician: Wenda Low, MD Location: [facility] Note by: Gaspar Cola, MD Date: 03/05/2017; Time: 3:40 PM

## 2017-03-05 NOTE — Patient Instructions (Addendum)
____________________________________________________________________________________________  Pain Scale  Introduction: The pain score used by this practice is the Verbal Numerical Rating Scale (VNRS-11). This is an 11-point scale. It is for adults and children 10 years or older. There are significant differences in how the pain score is reported, used, and applied. Forget everything you learned in the past and learn this scoring system.  General Information: The scale should reflect your current level of pain. Unless you are specifically asked for the level of your worst pain, or your average pain. If you are asked for one of these two, then it should be understood that it is over the past 24 hours.  Basic Activities of Daily Living (ADL): Personal hygiene, dressing, eating, transferring, and using restroom.  Instructions: Most patients tend to report their level of pain as a combination of two factors, their physical pain and their psychosocial pain. This last one is also known as "suffering" and it is reflection of how physical pain affects you socially and psychologically. From now on, report them separately. From this point on, when asked to report your pain level, report only your physical pain. Use the following table for reference.  Pain Clinic Pain Levels (0-5/10)  Pain Level Score  Description  No Pain 0   Mild pain 1 Nagging, annoying, but does not interfere with basic activities of daily living (ADL). Patients are able to eat, bathe, get dressed, toileting (being able to get on and off the toilet and perform personal hygiene functions), transfer (move in and out of bed or a chair without assistance), and maintain continence (able to control bladder and bowel functions). Blood pressure and heart rate are unaffected. A normal heart rate for a healthy adult ranges from 60 to 100 bpm (beats per minute).   Mild to moderate pain 2 Noticeable and distracting. Impossible to hide from other  people. More frequent flare-ups. Still possible to adapt and function close to normal. It can be very annoying and may have occasional stronger flare-ups. With discipline, patients may get used to it and adapt.   Moderate pain 3 Interferes significantly with activities of daily living (ADL). It becomes difficult to feed, bathe, get dressed, get on and off the toilet or to perform personal hygiene functions. Difficult to get in and out of bed or a chair without assistance. Very distracting. With effort, it can be ignored when deeply involved in activities.   Moderately severe pain 4 Impossible to ignore for more than a few minutes. With effort, patients may still be able to manage work or participate in some social activities. Very difficult to concentrate. Signs of autonomic nervous system discharge are evident: dilated pupils (mydriasis); mild sweating (diaphoresis); sleep interference. Heart rate becomes elevated (>115 bpm). Diastolic blood pressure (lower number) rises above 100 mmHg. Patients find relief in laying down and not moving.   Severe pain 5 Intense and extremely unpleasant. Associated with frowning face and frequent crying. Pain overwhelms the senses.  Ability to do any activity or maintain social relationships becomes significantly limited. Conversation becomes difficult. Pacing back and forth is common, as getting into a comfortable position is nearly impossible. Pain wakes you up from deep sleep. Physical signs will be obvious: pupillary dilation; increased sweating; goosebumps; brisk reflexes; cold, clammy hands and feet; nausea, vomiting or dry heaves; loss of appetite; significant sleep disturbance with inability to fall asleep or to remain asleep. When persistent, significant weight loss is observed due to the complete loss of appetite and sleep deprivation.  Blood   pressure and heart rate becomes significantly elevated. Caution: If elevated blood pressure triggers a pounding headache  associated with blurred vision, then the patient should immediately seek attention at an urgent or emergency care unit, as these may be signs of an impending stroke.    Emergency Department Pain Levels (6-10/10)  Emergency Room Pain 6 Severely limiting. Requires emergency care and should not be seen or managed at an outpatient pain management facility. Communication becomes difficult and requires great effort. Assistance to reach the emergency department may be required. Facial flushing and profuse sweating along with potentially dangerous increases in heart rate and blood pressure will be evident.   Distressing pain 7 Self-care is very difficult. Assistance is required to transport, or use restroom. Assistance to reach the emergency department will be required. Tasks requiring coordination, such as bathing and getting dressed become very difficult.   Disabling pain 8 Self-care is no longer possible. At this level, pain is disabling. The individual is unable to do even the most "basic" activities such as walking, eating, bathing, dressing, transferring to a bed, or toileting. Fine motor skills are lost. It is difficult to think clearly.   Incapacitating pain 9 Pain becomes incapacitating. Thought processing is no longer possible. Difficult to remember your own name. Control of movement and coordination are lost.   The worst pain imaginable 10 At this level, most patients pass out from pain. When this level is reached, collapse of the autonomic nervous system occurs, leading to a sudden drop in blood pressure and heart rate. This in turn results in a temporary and dramatic drop in blood flow to the brain, leading to a loss of consciousness. Fainting is one of the body's self defense mechanisms. Passing out puts the brain in a calmed state and causes it to shut down for a while, in order to begin the healing process.    Summary: 1. Refer to this scale when providing [place] with your pain level. 2. Be  accurate and careful when reporting your pain level. This will help with your care. 3. Over-reporting your pain level will lead to loss of credibility. 4. Even a level of 1/10 means that there is pain and will be treated at our facility. 5. High, inaccurate reporting will be documented as "Symptom Exaggeration", leading to loss of credibility and suspicions of possible secondary gains such as obtaining more narcotics, or wanting to appear disabled, for fraudulent reasons. 6. Only pain levels of 5 or below will be seen at our facility. 7. Pain levels of 6 and above will be sent to the Emergency Department and the appointment cancelled. ____________________________________________________________________________________________  ____________________________________________________________________________________________  Preparing for Procedure with Sedation Instructions: . Oral Intake: Do not eat or drink anything for at least 8 hours prior to your procedure. . Transportation: Public transportation is not allowed. Bring an adult driver. The driver must be physically present in our waiting room before any procedure can be started. Marland Kitchen Physical Assistance: Bring an adult physically capable of assisting you, in the event you need help. This adult should keep you company at home for at least 6 hours after the procedure. . Blood Pressure Medicine: Take your blood pressure medicine with a sip of water the morning of the procedure. . Blood thinners:  . Diabetics on insulin: Notify the staff so that you can be scheduled 1st case in the morning. If your diabetes requires high dose insulin, take only  of your normal insulin dose the morning of the procedure and notify the  staff that you have done so. . Preventing infections: Shower with an antibacterial soap the morning of your procedure. . Build-up your immune system: Take 1000 mg of Vitamin C with every meal (3 times a day) the day prior to your  procedure. Marland Kitchen Antibiotics: Inform the staff if you have a condition or reason that requires you to take antibiotics before dental procedures. . Pregnancy: If you are pregnant, call and cancel the procedure. . Sickness: If you have a cold, fever, or any active infections, call and cancel the procedure. . Arrival: You must be in the facility at least 30 minutes prior to your scheduled procedure. . Children: Do not bring children with you. . Dress appropriately: Bring dark clothing that you would not mind if they get stained. . Valuables: Do not bring any jewelry or valuables. Procedure appointments are reserved for interventional treatments only. Marland Kitchen No Prescription Refills. . No medication changes will be discussed during procedure appointments. . No disability issues will be discussed. ____________________________________________________________________________________________  GENERAL RISKS AND COMPLICATIONS  What are the risk, side effects and possible complications? Generally speaking, most procedures are safe.  However, with any procedure there are risks, side effects, and the possibility of complications.  The risks and complications are dependent upon the sites that are lesioned, or the type of nerve block to be performed.  The closer the procedure is to the spine, the more serious the risks are.  Great care is taken when placing the radio frequency needles, block needles or lesioning probes, but sometimes complications can occur. 1. Infection: Any time there is an injection through the skin, there is a risk of infection.  This is why sterile conditions are used for these blocks.  There are four possible types of infection. 1. Localized skin infection. 2. Central Nervous System Infection-This can be in the form of Meningitis, which can be deadly. 3. Epidural Infections-This can be in the form of an epidural abscess, which can cause pressure inside of the spine, causing compression of the  spinal cord with subsequent paralysis. This would require an emergency surgery to decompress, and there are no guarantees that the patient would recover from the paralysis. 4. Discitis-This is an infection of the intervertebral discs.  It occurs in about 1% of discography procedures.  It is difficult to treat and it may lead to surgery.        2. Pain: the needles have to go through skin and soft tissues, will cause soreness.       3. Damage to internal structures:  The nerves to be lesioned may be near blood vessels or    other nerves which can be potentially damaged.       4. Bleeding: Bleeding is more common if the patient is taking blood thinners such as  aspirin, Coumadin, Ticiid, Plavix, etc., or if he/she have some genetic predisposition  such as hemophilia. Bleeding into the spinal canal can cause compression of the spinal  cord with subsequent paralysis.  This would require an emergency surgery to  decompress and there are no guarantees that the patient would recover from the  paralysis.       5. Pneumothorax:  Puncturing of a lung is a possibility, every time a needle is introduced in  the area of the chest or upper back.  Pneumothorax refers to free air around the  collapsed lung(s), inside of the thoracic cavity (chest cavity).  Another two possible  complications related to a similar event would include: Hemothorax  and Chylothorax.   These are variations of the Pneumothorax, where instead of air around the collapsed  lung(s), you may have blood or chyle, respectively.       6. Spinal headaches: They may occur with any procedures in the area of the spine.       7. Persistent CSF (Cerebro-Spinal Fluid) leakage: This is a rare problem, but may occur  with prolonged intrathecal or epidural catheters either due to the formation of a fistulous  track or a dural tear.       8. Nerve damage: By working so close to the spinal cord, there is always a possibility of  nerve damage, which could be as  serious as a permanent spinal cord injury with  paralysis.       9. Death:  Although rare, severe deadly allergic reactions known as "Anaphylactic  reaction" can occur to any of the medications used.      10. Worsening of the symptoms:  We can always make thing worse.  What are the chances of something like this happening? Chances of any of this occuring are extremely low.  By statistics, you have more of a chance of getting killed in a motor vehicle accident: while driving to the hospital than any of the above occurring .  Nevertheless, you should be aware that they are possibilities.  In general, it is similar to taking a shower.  Everybody knows that you can slip, hit your head and get killed.  Does that mean that you should not shower again?  Nevertheless always keep in mind that statistics do not mean anything if you happen to be on the wrong side of them.  Even if a procedure has a 1 (one) in a 1,000,000 (million) chance of going wrong, it you happen to be that one..Also, keep in mind that by statistics, you have more of a chance of having something go wrong when taking medications.  Who should not have this procedure? If you are on a blood thinning medication (e.g. Coumadin, Plavix, see list of "Blood Thinners"), or if you have an active infection going on, you should not have the procedure.  If you are taking any blood thinners, please inform your physician.  How should I prepare for this procedure?  Do not eat or drink anything at least six hours prior to the procedure.  Bring a driver with you .  It cannot be a taxi.  Come accompanied by an adult that can drive you back, and that is strong enough to help you if your legs get weak or numb from the local anesthetic.  Take all of your medicines the morning of the procedure with just enough water to swallow them.  If you have diabetes, make sure that you are scheduled to have your procedure done first thing in the morning, whenever  possible.  If you have diabetes, take only half of your insulin dose and notify our nurse that you have done so as soon as you arrive at the clinic.  If you are diabetic, but only take blood sugar pills (oral hypoglycemic), then do not take them on the morning of your procedure.  You may take them after you have had the procedure.  Do not take aspirin or any aspirin-containing medications, at least eleven (11) days prior to the procedure.  They may prolong bleeding.  Wear loose fitting clothing that may be easy to take off and that you would not mind if it got stained with  Betadine or blood.  Do not wear any jewelry or perfume  Remove any nail coloring.  It will interfere with some of our monitoring equipment.  NOTE: Remember that this is not meant to be interpreted as a complete list of all possible complications.  Unforeseen problems may occur.  BLOOD THINNERS The following drugs contain aspirin or other products, which can cause increased bleeding during surgery and should not be taken for 2 weeks prior to and 1 week after surgery.  If you should need take something for relief of minor pain, you may take acetaminophen which is found in Tylenol,m Datril, Anacin-3 and Panadol. It is not blood thinner. The products listed below are.  Do not take any of the products listed below in addition to any listed on your instruction sheet.  A.P.C or A.P.C with Codeine Codeine Phosphate Capsules #3 Ibuprofen Ridaura  ABC compound Congesprin Imuran rimadil  Advil Cope Indocin Robaxisal  Alka-Seltzer Effervescent Pain Reliever and Antacid Coricidin or Coricidin-D  Indomethacin Rufen  Alka-Seltzer plus Cold Medicine Cosprin Ketoprofen S-A-C Tablets  Anacin Analgesic Tablets or Capsules Coumadin Korlgesic Salflex  Anacin Extra Strength Analgesic tablets or capsules CP-2 Tablets Lanoril Salicylate  Anaprox Cuprimine Capsules Levenox Salocol  Anexsia-D Dalteparin Magan Salsalate  Anodynos Darvon compound  Magnesium Salicylate Sine-off  Ansaid Dasin Capsules Magsal Sodium Salicylate  Anturane Depen Capsules Marnal Soma  APF Arthritis pain formula Dewitt's Pills Measurin Stanback  Argesic Dia-Gesic Meclofenamic Sulfinpyrazone  Arthritis Bayer Timed Release Aspirin Diclofenac Meclomen Sulindac  Arthritis pain formula Anacin Dicumarol Medipren Supac  Analgesic (Safety coated) Arthralgen Diffunasal Mefanamic Suprofen  Arthritis Strength Bufferin Dihydrocodeine Mepro Compound Suprol  Arthropan liquid Dopirydamole Methcarbomol with Aspirin Synalgos  ASA tablets/Enseals Disalcid Micrainin Tagament  Ascriptin Doan's Midol Talwin  Ascriptin A/D Dolene Mobidin Tanderil  Ascriptin Extra Strength Dolobid Moblgesic Ticlid  Ascriptin with Codeine Doloprin or Doloprin with Codeine Momentum Tolectin  Asperbuf Duoprin Mono-gesic Trendar  Aspergum Duradyne Motrin or Motrin IB Triminicin  Aspirin plain, buffered or enteric coated Durasal Myochrisine Trigesic  Aspirin Suppositories Easprin Nalfon Trillsate  Aspirin with Codeine Ecotrin Regular or Extra Strength Naprosyn Uracel  Atromid-S Efficin Naproxen Ursinus  Auranofin Capsules Elmiron Neocylate Vanquish  Axotal Emagrin Norgesic Verin  Azathioprine Empirin or Empirin with Codeine Normiflo Vitamin E  Azolid Emprazil Nuprin Voltaren  Bayer Aspirin plain, buffered or children's or timed BC Tablets or powders Encaprin Orgaran Warfarin Sodium  Buff-a-Comp Enoxaparin Orudis Zorpin  Buff-a-Comp with Codeine Equegesic Os-Cal-Gesic   Buffaprin Excedrin plain, buffered or Extra Strength Oxalid   Bufferin Arthritis Strength Feldene Oxphenbutazone   Bufferin plain or Extra Strength Feldene Capsules Oxycodone with Aspirin   Bufferin with Codeine Fenoprofen Fenoprofen Pabalate or Pabalate-SF   Buffets II Flogesic Panagesic   Buffinol plain or Extra Strength Florinal or Florinal with Codeine Panwarfarin   Buf-Tabs Flurbiprofen Penicillamine   Butalbital Compound  Four-way cold tablets Penicillin   Butazolidin Fragmin Pepto-Bismol   Carbenicillin Geminisyn Percodan   Carna Arthritis Reliever Geopen Persantine   Carprofen Gold's salt Persistin   Chloramphenicol Goody's Phenylbutazone   Chloromycetin Haltrain Piroxlcam   Clmetidine heparin Plaquenil   Cllnoril Hyco-pap Ponstel   Clofibrate Hydroxy chloroquine Propoxyphen         Before stopping any of these medications, be sure to consult the physician who ordered them.  Some, such as Coumadin (Warfarin) are ordered to prevent or treat serious conditions such as "deep thrombosis", "pumonary embolisms", and other heart problems.  The amount of time that you may  need off of the medication may also vary with the medication and the reason for which you were taking it.  If you are taking any of these medications, please make sure you notify your pain physician before you undergo any procedures.

## 2017-03-05 NOTE — Progress Notes (Signed)
Nursing Pain Medication Assessment:  Safety precautions to be maintained throughout the outpatient stay will include: orient to surroundings, keep bed in low position, maintain call bell within reach at all times, provide assistance with transfer out of bed and ambulation.  Medication Inspection Compliance: Pill count conducted under aseptic conditions, in front of the patient. Neither the pills nor the bottle was removed from the patient's sight at any time. Once count was completed pills were immediately returned to the patient in their original bottle.  Medication: Oxycodone IR Pill/Patch Count: 37 of 90 pills remain Pill/Patch Appearance: Markings consistent with prescribed medication Bottle Appearance: Standard pharmacy container. Clearly labeled. Filled Date: 09 / 19 / 2018 Last Medication intake:  Today

## 2017-03-10 DIAGNOSIS — H1089 Other conjunctivitis: Secondary | ICD-10-CM | POA: Diagnosis not present

## 2017-03-10 DIAGNOSIS — Z961 Presence of intraocular lens: Secondary | ICD-10-CM | POA: Diagnosis not present

## 2017-03-10 DIAGNOSIS — H3530 Unspecified macular degeneration: Secondary | ICD-10-CM | POA: Diagnosis not present

## 2017-03-10 DIAGNOSIS — H5201 Hypermetropia, right eye: Secondary | ICD-10-CM | POA: Diagnosis not present

## 2017-03-10 DIAGNOSIS — H25812 Combined forms of age-related cataract, left eye: Secondary | ICD-10-CM | POA: Diagnosis not present

## 2017-03-10 DIAGNOSIS — H15833 Staphyloma posticum, bilateral: Secondary | ICD-10-CM | POA: Diagnosis not present

## 2017-03-10 DIAGNOSIS — H52221 Regular astigmatism, right eye: Secondary | ICD-10-CM | POA: Diagnosis not present

## 2017-03-18 ENCOUNTER — Ambulatory Visit
Admission: RE | Admit: 2017-03-18 | Discharge: 2017-03-18 | Disposition: A | Discharge status: Home / Self Care | Payer: Medicare Other | Source: Ambulatory Visit | Attending: Pain Medicine | Admitting: Pain Medicine

## 2017-03-18 DIAGNOSIS — M48061 Spinal stenosis, lumbar region without neurogenic claudication: Secondary | ICD-10-CM | POA: Insufficient documentation

## 2017-03-18 DIAGNOSIS — M5126 Other intervertebral disc displacement, lumbar region: Secondary | ICD-10-CM | POA: Diagnosis not present

## 2017-03-18 DIAGNOSIS — M5134 Other intervertebral disc degeneration, thoracic region: Secondary | ICD-10-CM | POA: Insufficient documentation

## 2017-03-18 DIAGNOSIS — M5124 Other intervertebral disc displacement, thoracic region: Secondary | ICD-10-CM | POA: Diagnosis not present

## 2017-03-18 DIAGNOSIS — G8929 Other chronic pain: Secondary | ICD-10-CM | POA: Insufficient documentation

## 2017-03-18 DIAGNOSIS — M8938 Hypertrophy of bone, other site: Secondary | ICD-10-CM | POA: Diagnosis not present

## 2017-03-18 DIAGNOSIS — M545 Low back pain, unspecified: Secondary | ICD-10-CM

## 2017-03-18 DIAGNOSIS — M5136 Other intervertebral disc degeneration, lumbar region: Secondary | ICD-10-CM | POA: Diagnosis not present

## 2017-04-02 DIAGNOSIS — Z96651 Presence of right artificial knee joint: Secondary | ICD-10-CM

## 2017-04-02 DIAGNOSIS — M25561 Pain in right knee: Secondary | ICD-10-CM

## 2017-04-02 DIAGNOSIS — G8929 Other chronic pain: Secondary | ICD-10-CM | POA: Insufficient documentation

## 2017-04-02 NOTE — Progress Notes (Signed)
Patient's Name: [name]  MRN: [id number]  Referring Provider: Milinda Pointer, MD  DOB: 1940/12/09  PCP: Wenda Low, MD  DOS: 04/03/2017  Note by: Gaspar Cola, MD  Service setting: Ambulatory outpatient  Specialty: Interventional Pain Management  Patient type: Established  Location: [facility] (AMB) Pain Management Facility  Visit type: Interventional Procedure   Primary Reason for Visit: Interventional Pain Management Treatment. CC: Back Pain (right lower )  Procedure:  Anesthesia, Analgesia, Anxiolysis:  Type: Therapeutic Superior-lateral, Superior-medial, and Inferior-medial, Genicular Nerves Block. (CPT 930 [id number]) Region: Lateral, Anterior, and Medial aspects of the knee joint, above and below the knee joint proper. Level: Superior and inferior to the knee joint. Laterality: Right  Type: Local Anesthesia with Moderate (Conscious) Sedation Local Anesthetic: Lidocaine 1% Route: Intravenous (IV) IV Access: Secured Sedation: Meaningful verbal contact was maintained at all times during the procedure  Indication(s): Analgesia and Anxiety   Indications: 1. Chronic knee pain (Primary Area of Pain) (Right)   2. 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 Hx of total knee replacement (Bilateral)   4. Chronic pain of left knee (Bilateral) (R>L)   5. Chronic pain of right knee   6. Chronic knee pain after total replacement of right knee joint   7. Hx of total knee replacement, bilateral    Pain Score: Pre-procedure: 5 /10 Post-procedure: 0-No pain/10  Pre-op Assessment:  [name] is a 76 y.o. (year old), female patient, seen today for interventional treatment. She  has a past surgical history that includes Abdominal surgery; Colon surgery; Joint replacement; Appendectomy; Bladder surgery; Rectocele repair; Knee surgery; Cholecystectomy; Abdominal hysterectomy; Ostomy; Eye surgery; Hip surgery (Right, 08/2016); and INTRAMEDULLARY (IM) NAIL INTERTROCHANTRIC  RIGHT (Right, 09/13/2016). [name] has a current medication list which includes the following prescription(s): albuterol, albuterol, alprazolam, amlodipine, baclofen, calcium-vitamin d-vitamin k, citalopram, diclofenac sodium, diphenhydramine, docusate sodium, esomeprazole, fexofenadine hcl, gabapentin, ginkgo biloba, lovastatin, meloxicam, naloxegol oxalate, omega-3 fish oil, ondansetron, oxycodone, polyethylene glycol powder, prolia, promethazine, senna, and trazodone, and the following Facility-Administered Medications: fentanyl and midazolam. Her primarily concern today is the Back Pain (right lower )  Initial Vital Signs: Blood pressure (!) 182/85, pulse (!) 55, temperature 98.2 F (36.8 C), resp. rate 16, height 5\' 4"  (1.626 m), weight 189 lb (85.7 kg), SpO2 95 %. BMI: Estimated body mass index is 32.44 kg/m as calculated from the following:   Height as of this encounter: 5\' 4"  (1.626 m).   Weight as of this encounter: 189 lb (85.7 kg).  Risk Assessment: Allergies: Reviewed. She is allergic to ampicillin-sulbactam sodium; ampicillin; toradol [ketorolac tromethamine]; tramadol; flexeril [cyclobenzaprine]; ambien [zolpidem tartrate]; cephalexin; ketorolac; percocet [oxycodone-acetaminophen]; sulfamethoxazole-trimethoprim; and tape.  Allergy Precautions: None required Coagulopathies: Reviewed. None identified.  Blood-thinner therapy: None at this time Active Infection(s): Reviewed. None identified. [name] is afebrile  Site Confirmation: [name] was asked to confirm the procedure and laterality before marking the site Procedure checklist: Completed Consent: Before the procedure and under the influence of no sedative(s), amnesic(s), or anxiolytics, the patient was informed of the treatment options, risks and possible complications. To fulfill our ethical and legal obligations, as recommended by the American Medical Association's Code of Ethics, I have informed the patient of my clinical  impression; the nature and purpose of the treatment or procedure; the risks, benefits, and possible complications of the intervention; the alternatives, including doing nothing; the risk(s) and benefit(s) of the alternative treatment(s) or procedure(s); and the risk(s) and benefit(s) of doing nothing.  The patient was provided information about the general risks and possible complications associated with the procedure. These may include, but are not limited to: failure to achieve desired goals, infection, bleeding, organ or nerve damage, allergic reactions, paralysis, and death. In addition, the patient was informed of those risks and complications associated to the procedure, such as failure to decrease pain; infection; bleeding; organ or nerve damage with subsequent damage to sensory, motor, and/or autonomic systems, resulting in permanent pain, numbness, and/or weakness of one or several areas of the body; allergic reactions; (i.e.: anaphylactic reaction); and/or death. Furthermore, the patient was informed of those risks and complications associated with the medications. These include, but are not limited to: allergic reactions (i.e.: anaphylactic or anaphylactoid reaction(s)); adrenal axis suppression; blood sugar elevation that in diabetics may result in ketoacidosis or comma; water retention that in patients with history of congestive heart failure may result in shortness of breath, pulmonary edema, and decompensation with resultant heart failure; weight gain; swelling or edema; medication-induced neural toxicity; particulate matter embolism and blood vessel occlusion with resultant organ, and/or nervous system infarction; and/or aseptic necrosis of one or more joints. Finally, the patient was informed that Medicine is not an exact science; therefore, there is also the possibility of unforeseen or unpredictable risks and/or possible complications that may result in a catastrophic outcome. The patient  indicated having understood very clearly. We have given the patient no guarantees and we have made no promises. Enough time was given to the patient to ask questions, all of which were answered to the patient's satisfaction. Ms. Bouvier has indicated that she wanted to continue with the procedure. Attestation: I, the ordering provider, attest that I have discussed with the patient the benefits, risks, side-effects, alternatives, likelihood of achieving goals, and potential problems during recovery for the procedure that I have provided informed consent. Date: 04/03/2017; Time: 9:15 AM  Pre-Procedure Preparation:  Monitoring: As per clinic protocol. Respiration, ETCO2, SpO2, BP, heart rate and rhythm monitor placed and checked for adequate function Safety Precautions: Patient was assessed for positional comfort and pressure points before starting the procedure. Time-out: I initiated and conducted the "Time-out" before starting the procedure, as per protocol. The patient was asked to participate by confirming the accuracy of the "Time Out" information. Verification of the correct person, site, and procedure were performed and confirmed by me, the nursing staff, and the patient. "Time-out" conducted as per Joint Commission's Universal Protocol (UP.01.01.01). "Time-out" Date & Time: 04/03/2017; 0950 hrs.  Description of Procedure Process:  Position: Supine Target Area: For Genicular Nerve block(s), the targets are: the superior-lateral genicular nerve, located in the lateral distal portion of the femoral shaft as it curves to form the lateral epicondyle, in the region of the distal femoral metaphysis; the superior-medial genicular nerve, located in the medial distal portion of the femoral shaft as it curves to form the medial epicondyle; and the inferior-medial genicular nerve, located in the medial, proximal portion of the tibial shaft, as it curves to form the medial epicondyle, in the region of the proximal  tibial metaphysis. Approach: Anterior, ipsilateral approach. Area Prepped: Entire knee area, from mid-thigh to mid-shin, lateral, anterior, and medial aspects. Prepping solution: ChloraPrep (2% chlorhexidine gluconate and 70% isopropyl alcohol) Safety Precautions: Aspiration looking for blood return was conducted prior to all injections. At no point did we inject any substances, as a needle was being advanced. No attempts were made at seeking any paresthesias. Safe injection practices and needle disposal techniques used. Medications properly  checked for expiration dates. SDV (single dose vial) medications used. Latex Allergy precautions taken.   Description of the Procedure: Protocol guidelines were followed. The patient was placed in position over the procedure table. The target area was identified and the area prepped in the usual manner. Skin desensitized using vapocoolant spray. Skin & deeper tissues infiltrated with local anesthetic. Appropriate amount of time allowed to pass for local anesthetics to take effect. The procedure needles were then advanced to the target area. Proper needle placement secured. Negative aspiration confirmed. Solution injected in intermittent fashion, asking for systemic symptoms every 0.5cc of injectate. The needles were then removed and the area cleansed, making sure to leave some of the prepping solution back to take advantage of its long term bactericidal properties.  Vitals:   04/03/17 1006 04/03/17 1013 04/03/17 1023 04/03/17 1030  BP: (!) 160/77 (!) 157/79 (!) 159/106 (!) 176/83  Pulse:      Resp: 14 19 20 20   Temp: (!) 97.3 F (36.3 C)     TempSrc: Tympanic     SpO2: 94% 94% 95% 95%  Weight:      Height:        Start Time: 0950 hrs. End Time: 0955 hrs. Materials:  Needle(s) Type: Regular needle Gauge: 22G Length: 3.5-in Medication(s): We administered lactated ringers, midazolam, fentaNYL, lidocaine, triamcinolone acetonide, and ropivacaine (PF) 2 mg/mL  (0.2%). Please see chart orders for dosing details.  Imaging Guidance (Non-Spinal):  Type of Imaging Technique: Fluoroscopy Guidance (Non-Spinal) Indication(s): Assistance in needle guidance and placement for procedures requiring needle placement in or near specific anatomical locations not easily accessible without such assistance. Exposure Time: Please see nurses notes. Contrast: Before injecting any contrast, we confirmed that the patient did not have an allergy to iodine, shellfish, or radiological contrast. Once satisfactory needle placement was completed at the desired level, radiological contrast was injected. Contrast injected under live fluoroscopy. No contrast complications. See chart for type and volume of contrast used. Fluoroscopic Guidance: I was personally present during the use of fluoroscopy. "Tunnel Vision Technique" used to obtain the best possible view of the target area. Parallax error corrected before commencing the procedure. "Direction-depth-direction" technique used to introduce the needle under continuous pulsed fluoroscopy. Once target was reached, antero-posterior, oblique, and lateral fluoroscopic projection used confirm needle placement in all planes. Images permanently stored in EMR. Interpretation: I personally interpreted the imaging intraoperatively. Adequate needle placement confirmed in multiple planes. Appropriate spread of contrast into desired area was observed. No evidence of afferent or efferent intravascular uptake. Permanent images saved into the patient's record.  Antibiotic Prophylaxis:  Indication(s): None identified Antibiotic given: None  Post-operative Assessment:  EBL: None Complications: No immediate post-treatment complications observed by team, or reported by patient. Note: The patient tolerated the entire procedure well. A repeat set of vitals were taken after the procedure and the patient was kept under observation following institutional  policy, for this type of procedure. Post-procedural neurological assessment was performed, showing return to baseline, prior to discharge. The patient was provided with post-procedure discharge instructions, including a section on how to identify potential problems. Should any problems arise concerning this procedure, the patient was given instructions to immediately contact us, at any time, without hesitation. In any case, we plan to contact the patient by telephone for a follow-up status report regarding this interventional procedure. Comments:  No additional relevant information.  Plan of Care   Imaging Orders     DG C-Arm 1-60 Min-No Report  Procedure Orders  GENICULAR NERVE BLOCK  Medications ordered for procedure: Meds ordered this encounter  Medications  . lactated ringers infusion 1,000 mL  . midazolam (VERSED) 5 MG/5ML injection 1-2 mg    Make sure Flumazenil is available in the pyxis when using this medication. If oversedation occurs, administer 0.2 mg IV over 15 sec. If after 45 sec no response, administer 0.2 mg again over 1 min; may repeat at 1 min intervals; not to exceed 4 doses (1 mg)  . fentaNYL (SUBLIMAZE) injection 25-50 mcg    Make sure Narcan is available in the pyxis when using this medication. In the event of respiratory depression (RR< 8/min): Titrate NARCAN (naloxone) in increments of 0.1 to 0.2 mg IV at 2-3 minute intervals, until desired degree of reversal.  . lidocaine (XYLOCAINE) 2 % (with pres) injection 200 mg  . triamcinolone acetonide (KENALOG-40) injection 40 mg  . ropivacaine (PF) 2 mg/mL (0.2%) (NAROPIN) injection 9 mL   Medications administered: We administered lactated ringers, midazolam, fentaNYL, lidocaine, triamcinolone acetonide, and ropivacaine (PF) 2 mg/mL (0.2%).  See the medical record for exact dosing, route, and time of administration.  This SmartLink is deprecated. Use AVSMEDLIST instead to display the medication list for a  patient. Disposition: Discharge home  Discharge Date & Time: 04/03/2017; 1031 hrs.   Physician-requested Follow-up: Return for post-procedure eval by Dr. Dossie Arbour in 2 wks. Future Appointments  Date Time Provider [place]  04/23/2017  8:15 AM Milinda Pointer, MD [facility] None  06/10/2017 11:30 AM Penumalli, Earlean Polka, MD [facility] None   Primary Care Physician: Wenda Low, MD Location: [facility] Note by: Gaspar Cola, MD Date: 04/03/2017; Time: 10:43 AM  Disclaimer:  Medicine is not an exact science. The only guarantee in medicine is that nothing is guaranteed. It is important to note that the decision to proceed with this intervention was based on the information collected from the patient. The Data and conclusions were drawn from the patient's questionnaire, the interview, and the physical examination. Because the information was provided in large part by the patient, it cannot be guaranteed that it has not been purposely or unconsciously manipulated. Every effort has been made to obtain as much relevant data as possible for this evaluation. It is important to note that the conclusions that lead to this procedure are derived in large part from the available data. Always take into account that the treatment will also be dependent on availability of resources and existing treatment guidelines, considered by other Pain Management Practitioners as being common knowledge and practice, at the time of the intervention. For Medico-Legal purposes, it is also important to point out that variation in procedural techniques and pharmacological choices are the acceptable norm. The indications, contraindications, technique, and results of the above procedure should only be interpreted and judged by a Board-Certified Interventional Pain Specialist with extensive familiarity and expertise in the same exact procedure and technique.

## 2017-04-03 ENCOUNTER — Encounter: Payer: Self-pay | Admitting: Pain Medicine

## 2017-04-03 ENCOUNTER — Ambulatory Visit
Admission: RE | Admit: 2017-04-03 | Discharge: 2017-04-03 | Disposition: A | Discharge status: Home / Self Care | Payer: Medicare Other | Source: Ambulatory Visit | Attending: Pain Medicine | Admitting: Pain Medicine

## 2017-04-03 ENCOUNTER — Other Ambulatory Visit: Payer: Self-pay

## 2017-04-03 ENCOUNTER — Ambulatory Visit (HOSPITAL_BASED_OUTPATIENT_CLINIC_OR_DEPARTMENT_OTHER): Payer: Medicare Other | Admitting: Pain Medicine

## 2017-04-03 VITALS — BP 176/83 | HR 56 | Temp 97.3°F | Resp 20 | Ht 64.0 in | Wt 189.0 lb

## 2017-04-03 DIAGNOSIS — M25562 Pain in left knee: Secondary | ICD-10-CM

## 2017-04-03 DIAGNOSIS — M25561 Pain in right knee: Secondary | ICD-10-CM | POA: Diagnosis present

## 2017-04-03 DIAGNOSIS — Z96653 Presence of artificial knee joint, bilateral: Secondary | ICD-10-CM | POA: Diagnosis not present

## 2017-04-03 DIAGNOSIS — Z96651 Presence of right artificial knee joint: Secondary | ICD-10-CM

## 2017-04-03 DIAGNOSIS — G8929 Other chronic pain: Secondary | ICD-10-CM | POA: Diagnosis not present

## 2017-04-03 MED ORDER — LIDOCAINE HCL 2 % IJ SOLN
10.0000 mL | Freq: Once | INTRAMUSCULAR | Status: AC
  Administered 2017-04-03: 400 mg
  Filled 2017-04-03: qty 40

## 2017-04-03 MED ORDER — TRIAMCINOLONE ACETONIDE 40 MG/ML IJ SUSP
40.0000 mg | Freq: Once | INTRAMUSCULAR | Status: AC
  Administered 2017-04-03: 40 mg
  Filled 2017-04-03: qty 1

## 2017-04-03 MED ORDER — FENTANYL CITRATE (PF) 100 MCG/2ML IJ SOLN
25.0000 ug | INTRAMUSCULAR | Status: DC | PRN
  Administered 2017-04-03: 100 ug via INTRAVENOUS
  Filled 2017-04-03: qty 2

## 2017-04-03 MED ORDER — LACTATED RINGERS IV SOLN
1000.0000 mL | Freq: Once | INTRAVENOUS | Status: AC
  Administered 2017-04-03: 1000 mL via INTRAVENOUS

## 2017-04-03 MED ORDER — ROPIVACAINE HCL 2 MG/ML IJ SOLN
9.0000 mL | Freq: Once | INTRAMUSCULAR | Status: AC
  Administered 2017-04-03: 9 mL via PERINEURAL
  Filled 2017-04-03: qty 10

## 2017-04-03 MED ORDER — MIDAZOLAM HCL 5 MG/5ML IJ SOLN
1.0000 mg | INTRAMUSCULAR | Status: DC | PRN
  Administered 2017-04-03: 5 mg via INTRAVENOUS
  Filled 2017-04-03: qty 5

## 2017-04-03 NOTE — Progress Notes (Signed)
Safety precautions to be maintained throughout the outpatient stay will include: orient to surroundings, keep bed in low position, maintain call bell within reach at all times, provide assistance with transfer out of bed and ambulation.  

## 2017-04-03 NOTE — Patient Instructions (Signed)

## 2017-04-04 ENCOUNTER — Telehealth: Payer: Self-pay

## 2017-04-04 NOTE — Telephone Encounter (Signed)
Post procedure phone call.  Left voicemail.

## 2017-04-22 NOTE — Progress Notes (Signed)
Patient's Name: [name]  MRN: [id number]  Referring Provider: Wenda Low, MD  DOB: [birth year]  PCP: Wenda Low, MD  DOS: 04/23/2017  Note by: Gaspar Cola, MD  Service setting: Ambulatory outpatient  Specialty: Interventional Pain Management  Location: [facility] (AMB) Pain Management Facility    Patient type: Established   Primary Reason(s) for Visit: Encounter for prescription drug management & post-procedure evaluation of chronic illness with mild to moderate exacerbation(Level of risk: moderate) CC: Knee Pain (right); Hip Pain (right); Back Pain (lower); and Neck Pain  HPI  [name] is a 76 y.o. year old, female patient, who comes today for a post-procedure evaluation and medication management. 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[place]); Hypertensive kidney disease, malignant; Chronic low back pain (Tertiary Area of Pain) (Bilateral) (L>R); CAFL (chronic airflow limitation) ([place]); Apoplectic vertigo; Duodenogastric reflux; History of stroke; Hoarseness; Benign essential HTN; Chronic knee pain (Primary Area of Pain) (Right); Depression, major, in partial remission ([place]); Major depression in remission ([place]); Involutional osteoporosis; Pharyngoesophageal dysphagia; 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); Hx of total knee replacement (Bilateral); Infection of the upper respiratory tract; Bed wetting; UTI (urinary tract infection); Long term current use of opiate analgesic; Long term prescription opiate use; Chronic pain syndrome;  Chronic neck pain (Secondary Area of Pain) (Bilateral) (L>R); Chronic hip pain (Right); Rib pain; DDD (degenerative disc disease), cervical; Lumbar facet arthropathy; Chronic myofascial pain; Lumbar facet syndrome (Bilateral) (L>R); Long term prescription benzodiazepine use; Opiate use; Disorder of skeletal system; Pharmacologic therapy; Problems influencing health status; Opioid-induced constipation (OIC); NSAID long-term use; DDD (degenerative disc disease), lumbar; Chronic knee pain (Bilateral) (R>L); Osteoarthritis; Chronic musculoskeletal pain; Neurogenic pain; Chronic knee pain s/p total knee replacement (TKR) (Right); and CKD (chronic kidney disease) stage 3, GFR 30-59 ml/min (HCC) on their problem list. Her primarily concern today is the Knee Pain (right); Hip Pain (right); Back Pain (lower); and Neck Pain  Pain Assessment: Location: Right Knee Radiating: does not radiate Onset: More than a month ago Duration: Chronic pain Quality: Aching, Discomfort Severity: 7 /10 (self-reported pain score)  Note: Reported level is compatible with observation.                         When using our objective Pain Scale, levels between 6 and 10/10 are said to belong in an emergency room, as it progressively worsens from a 6/10, described as severely limiting, requiring emergency care not usually available at an outpatient pain management facility. At a 6/10 level, communication becomes difficult and requires great effort. Assistance to reach the emergency department may be required. Facial flushing and profuse sweating along with potentially dangerous increases in heart rate and blood pressure will be evident. Effect on ADL: prolonged walking, standing Timing: Constant Modifying factors: "Nothing"  [name] was last seen on 04/03/2017 for a procedure. During today's appointment we reviewed [name]'s post-procedure results, as well as her outpatient medication regimen. Today the patient comes in complaining  primarily of pain in the right hip. This area is tender to palpation. I reviewed the actual x-rays and I can see that some of the hardware is sticking out in the area  or the trochanteric bursa is located. This is exactly where she has some pain. At this point I don't feel comfortable injecting that area due to the hardware and the possibility of infection. I have communicated this to the patient and they understand my reasons. They also complain about the low back pain which we have not addressed yet. All the evidence that we have points at the possibility of bilateral lumbar facet syndrome. In view of this, I will be scheduling the patient to come back for a diagnostic bilateral lumbar facet block under fluoroscopic guidance and IV sedation. We recently did a diagnostic right genicular nerve block with excellent results with the patient attained 100% relief of the pain for the duration of the local anesthetic. Unfortunately, the mechanism sustaining her pain is noninflammatory and therefore she did not get any long-term benefit from the steroids. Being this to case, I have decided to schedule the patient for a right-sided genicular nerve RFA, as soon as approved.  Today the patient has returned to her oxycodone IR 5 mg indicating that it is not working for her. She does indicate that in the past hydrocodone has worked better for her and therefore I have agreed to give her a prescription for Norco 5 mg to be taken every 8 hours when necessary for pain. The patient certainly has documented pathology justifying the use of these medications. Ideally she would benefit from nonsteroidal anti-inflammatory drugs. She does take some meloxicam but I have recommended that she discontinue the use of this meloxicam secondary to her chronic kidney disease. I have also recommended that she follow up with her primary care physician to assess and treat this. Today, to assist with her acute pain, I have ordered the nurses to give  her an IM injection of Norflex 30 mg.  Further details on both, my assessment(s), as well as the proposed treatment plan, please see below.  Controlled Substance Pharmacotherapy Assessment REMS (Risk Evaluation and Mitigation Strategy)  Analgesic: Oxycodone IR 5 mg 1 tablet by mouth every 8 hours (15 mg/day oxycodone) (22.5 MME/day) MME/day: 22.5 mg/day. Ignatius Specking, RN  04/23/2017  9:00 AM  Sign at close encounter Nursing Pain Medication Assessment:  Safety precautions to be maintained throughout the outpatient stay will include: orient to surroundings, keep bed in low position, maintain call bell within reach at all times, provide assistance with transfer out of bed and ambulation.  Medication Inspection Compliance: Pill count conducted under aseptic conditions, in front of the patient. Neither the pills nor the bottle was removed from the patient's sight at any time. Once count was completed pills were immediately returned to the patient in their original bottle.  Medication: See above Pill/Patch Count: 78 of 90 pills remain Pill/Patch Appearance: Markings consistent with prescribed medication Bottle Appearance: Standard pharmacy container. Clearly labeled. Filled Date: 70 / 19 / 2018 Last Medication intake:  Doesnt take, they dont work   Wasted to commode witness by myself, patient, and JSHately   Pharmacokinetics: Liberation and absorption (onset of action): WNL Distribution (time to peak effect): WNL Metabolism and excretion (duration of action): WNL         Pharmacodynamics: Desired effects: Analgesia: [name] reports >50% benefit. Functional ability: Patient reports that medication allows her to accomplish basic ADLs Clinically meaningful improvement in function (CMIF): Sustained CMIF goals met Perceived effectiveness: Described as relatively effective, allowing for increase in activities of daily living (ADL) Undesirable effects: Side-effects or Adverse reactions:  None reported Monitoring: [place] PMP:  Online review of the past 29-monthperiod conducted. Compliant with practice rules and regulations Last UDS on record: Summary  Date Value Ref Range Status  11/07/2016 FINAL  Final    Comment:    ==================================================================== TOXAS[facility]RE COMP DRUG ANALYSIS,UR ==================================================================== Test                             Result       Flag       Units Drug Present and Declared for Prescription Verification   Alprazolam                     95           EXPECTED   ng/mg creat   Alpha-hydroxyalprazolam        71           EXPECTED   ng/mg creat    Source of alprazolam is a scheduled prescription medication.    Alpha-hydroxyalprazolam is an expected metabolite of alprazolam.   Hydrocodone                    946          EXPECTED   ng/mg creat   Hydromorphone                  53           EXPECTED   ng/mg creat   Dihydrocodeine                 75           EXPECTED   ng/mg creat   Norhydrocodone                 1955         EXPECTED   ng/mg creat    Sources of hydrocodone include scheduled prescription    medications. Hydromorphone, dihydrocodeine and norhydrocodone are    expected metabolites of hydrocodone. Hydromorphone and    dihydrocodeine are also available as scheduled prescription    medications.   Oxycodone                      182          EXPECTED   ng/mg creat   Noroxycodone                   1913         EXPECTED   ng/mg creat    Sources of oxycodone include scheduled prescription medications.    Noroxycodone is an expected metabolite of oxycodone.   Gabapentin                     PRESENT      EXPECTED   Methocarbamol                  PRESENT      EXPECTED   Citalopram                     PRESENT      EXPECTED   Desmethylcitalopram            PRESENT      EXPECTED    Desmethylcitalopram is an expected metabolite of citalopram or    the enantiomeric form,  escitalopram.   Trazodone  PRESENT      EXPECTED   1,3 chlorophenyl piperazine    PRESENT      EXPECTED    1,3-chlorophenyl piperazine is an expected metabolite of    trazodone.   Acetaminophen                  PRESENT      EXPECTED Drug Present not Declared for Prescription Verification   Cyclobenzaprine                PRESENT      UNEXPECTED Drug Absent but Declared for Prescription Verification   Baclofen                       Not Detected UNEXPECTED   Lidocaine                      Not Detected UNEXPECTED    Lidocaine, as indicated in the declared medication list, is not    always detected even when used as directed. ==================================================================== Test                      Result    Flag   Units      Ref Range   Creatinine              112              mg/dL      >=20 ==================================================================== Declared Medications:  The flagging and interpretation on this report are based on the  following declared medications.  Unexpected results may arise from  inaccuracies in the declared medications.  **Note: The testing scope of this panel includes these medications:  Alprazolam  Baclofen  Citalopram (Celexa)  Gabapentin  Hydrocodone (Hydrocodone-Acetaminophen)  Methocarbamol  Oxycodone (Oxycodone Acetaminophen)  Trazodone  **Note: The testing scope of this panel does not include small to  moderate amounts of these reported medications:  Acetaminophen  Acetaminophen (Hydrocodone-Acetaminophen)  Acetaminophen (Oxycodone Acetaminophen)  Lidocaine  **Note: The testing scope of this panel does not include following  reported medications:  Albuterol  Amlodipine  Buspirone  Ciprofloxacin  Cyanocobalamin  Docusate  Enoxaparin  Iron  Lovastatin  Meloxicam  Methylprednisolone  Naloxegol (Movantik)  Omeprazole (Nexium)  Ondansetron  Polyethylene Glycol  Sennosides   Tamsulosin ==================================================================== For clinical consultation, please call [phone]. ====================================================================    UDS interpretation: Compliant          Medication Assessment Form: Reviewed. Patient indicates being compliant with therapy Treatment compliance: Compliant Risk Assessment Profile: Aberrant behavior: See prior evaluations. None observed or detected today Comorbid factors increasing risk of overdose: See prior notes. No additional risks detected today Risk of substance use disorder (SUD): Low Opioid Risk Tool - 04/23/17 0826      Family History of Substance Abuse   Alcohol  Negative    Illegal Drugs  Negative    Rx Drugs  Negative      Personal History of Substance Abuse   Alcohol  Negative    Illegal Drugs  Negative    Rx Drugs  Negative      Total Score   Opioid Risk Tool Scoring  0    Opioid Risk Interpretation  Low Risk      ORT Scoring interpretation table:  Score <3 = Low Risk for SUD  Score between 4-7 = Moderate Risk for SUD  Score >8 = High Risk for Opioid Abuse   Risk Mitigation Strategies:  Patient Counseling: Covered Patient-Prescriber Agreement (PPA): Present and active  Notification to other healthcare providers: Done  Pharmacologic Plan: No change in therapy, at this time  Post-Procedure Assessment  04/03/2017 Procedure: Diagnostic right-sided genicular nerve block under fluoroscopic guidance and IV sedation Pre-procedure pain score:  5/10 Post-procedure pain score: 0/10 (100% relief) Influential Factors: BMI: 36.13 kg/m Intra-procedural challenges: None observed.         Assessment challenges: None detected.              Reported side-effects: None.        Post-procedural adverse reactions or complications: None reported         Sedation: Please see nurses note. When no sedatives are used, the analgesic levels obtained are directly associated to  the effectiveness of the local anesthetics. However, when sedation is provided, the level of analgesia obtained during the initial 1 hour following the intervention, is believed to be the result of a combination of factors. These factors may include, but are not limited to: 1. The effectiveness of the local anesthetics used. 2. The effects of the analgesic(s) and/or anxiolytic(s) used. 3. The degree of discomfort experienced by the patient at the time of the procedure. 4. The patients ability and reliability in recalling and recording the events. 5. The presence and influence of possible secondary gains and/or psychosocial factors. Reported result: Relief experienced during the 1st hour after the procedure: 100 % (Ultra-Short Term Relief) [name] has indicated area to have been numb during this time. Interpretative annotation: Clinically appropriate result. Analgesia during this period is likely to be Local Anesthetic and/or IV Sedative (Analgesic/Anxiolytic) related.          Effects of local anesthetic: The analgesic effects attained during this period are directly associated to the localized infiltration of local anesthetics and therefore cary significant diagnostic value as to the etiological location, or anatomical origin, of the pain. Expected duration of relief is directly dependent on the pharmacodynamics of the local anesthetic used. Long-acting (4-6 hours) anesthetics used.  Reported result: Relief during the next 4 to 6 hour after the procedure: 100 % (Short-Term Relief) [name] has indicated area to have been numb during this time. Interpretative annotation: Clinically appropriate result. Analgesia during this period is likely to be Local Anesthetic-related.          Long-term benefit: Defined as the period of time past the expected duration of local anesthetics (1 hour for short-acting and 4-6 hours for long-acting). With the possible exception of prolonged sympathetic blockade from  the local anesthetics, benefits during this period are typically attributed to, or associated with, other factors such as analgesic sensory neuropraxia, antiinflammatory effects, or beneficial biochemical changes provided by agents other than the local anesthetics.  Reported result: Extended relief following procedure: 0 %(3 days) (Long-Term Relief)            Interpretative annotation: Clinically appropriate result. Good relief. No permanent benefit expected. Inflammation plays a part in the etiology to the pain.          Current benefits: Defined as reported results that persistent at this point in time.   Analgesia: <25 %            Function: Somewhat improved ROM: Somewhat improved Interpretative annotation: Recurrence of symptoms. No permanent benefit expected. Effective diagnostic intervention.          Interpretation: Results would suggest a successful diagnostic intervention.  Plan:  Proceed with Radiofrequency Ablation for the purpose of attaining long-term benefits.        Laboratory Chemistry  Inflammation Markers (CRP: Acute Phase) (ESR: Chronic Phase) Lab Results  Component Value Date   CRP <0.8 11/19/2016   ESRSEDRATE 20 11/19/2016                 Rheumatology Markers No results found for: RF, ANA, LABURIC, URICUR, LYMEIGGIGMAB, LYMEABIGMQN              Renal Function Markers Lab Results  Component Value Date   BUN 7 (L) 12/04/2016   CREATININE 1.09 (H) 12/04/2016   GFRAA 57 (L) 12/04/2016   GFRNONAA 50 (L) 12/04/2016                 Hepatic Function Markers Lab Results  Component Value Date   AST 19 12/04/2016   ALT 15 12/04/2016   ALBUMIN 4.0 12/04/2016   ALKPHOS 104 12/04/2016                 Electrolytes Lab Results  Component Value Date   NA 145 (H) 12/04/2016   K 4.0 12/04/2016   CL 107 (H) 12/04/2016   CALCIUM 9.4 12/04/2016   MG 2.0 11/19/2016                 Neuropathy Markers Lab Results  Component Value Date    VITAMINB12 1,888 (H) 11/19/2016   HGBA1C 5.6 05/22/2015                 Bone Pathology Markers Lab Results  Component Value Date   25OHVITD1 37 11/19/2016   25OHVITD2 <1.0 11/19/2016   25OHVITD3 37 11/19/2016                 Coagulation Parameters Lab Results  Component Value Date   INR 1.00 09/13/2016   LABPROT 13.2 09/13/2016   APTT 34 01/04/2011   PLT 263 12/04/2016   DDIMER 1.71 (H) 10/23/2016                 Cardiovascular Markers Lab Results  Component Value Date   BNP 60.2 01/23/2016   TROPONINI <0.03 05/23/2015   HGB 14.7 12/04/2016   HCT 44.8 12/04/2016                 CA Markers No results found for: CEA, CA125, LABCA2               Note: Lab results reviewed.  Recent Diagnostic Imaging Results  DG C-Arm 1-60 Min-No Report Fluoroscopy was utilized by the requesting physician.  No radiographic  interpretation.   Complexity Note: Imaging results reviewed. Results shared with Ms. Seaberry, using Layman's terms.                         Meds   Current Outpatient Medications:  .  albuterol (PROVENTIL HFA;VENTOLIN HFA) 108 (90 BASE) MCG/ACT inhaler, Inhale 1 puff into the lungs every 6 (six) hours as needed for wheezing or shortness of breath., Disp: , Rfl:  .  albuterol (PROVENTIL) (2.5 MG/3ML) 0.083% nebulizer solution, Take 2.5 mg by nebulization every 6 (six) hours as needed for wheezing or shortness of breath., Disp: , Rfl:  .  ALPRAZolam (XANAX) 1 MG tablet, Take 1 mg by mouth at bedtime as needed. , Disp: , Rfl:  .  amLODipine (NORVASC) 5 MG tablet, Take 5 mg by mouth daily. , Disp: , Rfl:  .  baclofen (LIORESAL) 10 MG tablet, Take 1 tablet (10 mg total) by mouth daily., Disp: 30 tablet, Rfl: 2 .  Calcium-Vitamin D-Vitamin K (CALCIUM SOFT CHEWS PO), Take by mouth daily at 6 (six) AM., Disp: , Rfl:  .  citalopram (CELEXA) 20 MG tablet, Take 20 mg by mouth daily. , Disp: , Rfl:  .  diclofenac sodium (VOLTAREN) 1 % GEL, Apply 2 g topically 4 (four) times  daily., Disp: 100 g, Rfl: 2 .  diphenhydrAMINE (BENADRYL) 25 MG tablet, Take 25 mg by mouth as needed., Disp: , Rfl:  .  docusate sodium (COLACE) 100 MG capsule, Take 100 mg by mouth as needed for mild constipation., Disp: , Rfl:  .  esomeprazole (NEXIUM) 40 MG capsule, Take 40 mg by mouth 2 (two) times daily before a meal. , Disp: , Rfl:  .  Fexofenadine HCl (MUCINEX ALLERGY PO), Take by mouth as needed., Disp: , Rfl:  .  gabapentin (NEURONTIN) 400 MG capsule, Take 1 capsule (400 mg total) by mouth 4 (four) times daily., Disp: 120 capsule, Rfl: 2 .  GINKGO BILOBA PO, Take by mouth daily at 6 (six) AM., Disp: , Rfl:  .  lovastatin (MEVACOR) 10 MG tablet, Take 10 mg by mouth daily., Disp: , Rfl:  .  meloxicam (MOBIC) 15 MG tablet, Take 15 mg by mouth every evening., Disp: , Rfl:  .  naloxegol oxalate (MOVANTIK) 25 MG TABS tablet, Take 25 mg by mouth daily., Disp: , Rfl:  .  omega-3 fish oil (MAXEPA) 1000 MG CAPS capsule, Take by mouth daily at 6 (six) AM., Disp: , Rfl:  .  ondansetron (ZOFRAN) 4 MG tablet, Take 4 mg by mouth every 8 (eight) hours as needed for nausea. , Disp: , Rfl:  .  polyethylene glycol powder (GLYCOLAX/MIRALAX) powder, , Disp: , Rfl:  .  PROLIA 60 MG/ML SOLN injection, Inject into the skin every 6 (six) months. , Disp: , Rfl:  .  promethazine (PHENERGAN) 12.5 MG suppository, Place 12.5 mg rectally as needed for nausea or vomiting., Disp: , Rfl:  .  senna (SENOKOT) 8.6 MG TABS tablet, Take 2 tablets by mouth. constipation, Disp: , Rfl:  .  traZODone (DESYREL) 100 MG tablet, Take 50 mg by mouth at bedtime. , Disp: , Rfl:  .  HYDROcodone-acetaminophen (NORCO/VICODIN) 5-325 MG tablet, Take 1 tablet by mouth every 8 (eight) hours., Disp: 90 tablet, Rfl: 0  ROS  Constitutional: Denies any fever or chills Gastrointestinal: No reported hemesis, hematochezia, vomiting, or acute GI distress Musculoskeletal: Denies any acute onset joint swelling, redness, loss of ROM, or  weakness Neurological: No reported episodes of acute onset apraxia, aphasia, dysarthria, agnosia, amnesia, paralysis, loss of coordination, or loss of consciousness  Allergies  [name] is allergic to ampicillin-sulbactam sodium; ampicillin; toradol [ketorolac tromethamine]; tramadol; flexeril [cyclobenzaprine]; ambien [zolpidem tartrate]; cephalexin; ketorolac; percocet [oxycodone-acetaminophen]; sulfamethoxazole-trimethoprim; and tape.  [place]  Drug: [name]  reports that she does not use drugs. Alcohol:  reports that she does not drink alcohol. Tobacco:  reports that she has quit smoking. She has a 45.00 pack-year smoking history. she has never used smokeless tobacco. Medical:  has a past medical history of Anemia, B12 deficiency, Bacteremia (10/07/2014), Bladder incontinence, Blind left eye, Cataract, Cervical spine fracture ([place]), Chest pain (07/29/2013), Compression fracture, COPD (chronic obstructive pulmonary disease) ([place]), DDD (degenerative disc disease), lumbar, Depression, Diffuse myofascial pain syndrome (03/24/2015), Fever (10/05/2014), GERD (gastroesophageal reflux disease), Hiatal hernia, Hyperlipidemia, Hypertension, Hypertensive kidney disease, malignant (11/07/2016), Macular degeneration, Osteoarthritis,  Osteoporosis, Reactive airway disease, Rupture of bowel ([place]), Sepsis ([place]) (10/08/2014), Stroke [facility]), Stroke [facility]), and Wrist pain, acute (09/10/2012). Surgical: [name]  has a past surgical history that includes Abdominal surgery; Colon surgery; Joint replacement; Appendectomy; Bladder surgery; Rectocele repair; Knee surgery; Cholecystectomy; Abdominal hysterectomy; Ostomy; Eye surgery; Intramedullary (im) nail intertrochanteric (Right, 09/13/2016); and Hip surgery (Right, 08/2016). Family: family history includes Heart attack in her father; Heart failure in her mother.  Constitutional Exam  General appearance: Well nourished, well developed, and well hydrated. In no apparent acute  distress Vitals:   04/23/17 0813  BP: (!) 177/107  Pulse: 61  Resp: 18  Temp: 98 F (36.7 C)  SpO2: 99%  Weight: 185 lb (83.9 kg)  Height: 5' (1.524 m)   BMI Assessment: Estimated body mass index is 36.13 kg/m as calculated from the following:   Height as of this encounter: 5' (1.524 m).   Weight as of this encounter: 185 lb (83.9 kg).  BMI interpretation table: BMI level Category Range association with higher incidence of chronic pain  <18 kg/m2 Underweight   18.5-24.9 kg/m2 Ideal body weight   25-29.9 kg/m2 Overweight Increased incidence by 20%  30-34.9 kg/m2 Obese (Class I) Increased incidence by 68%  35-39.9 kg/m2 Severe obesity (Class II) Increased incidence by 136%  >40 kg/m2 Extreme obesity (Class III) Increased incidence by 254%   BMI Readings from Last 4 Encounters:  04/23/17 36.13 kg/m  04/03/17 32.44 kg/m  03/05/17 30.90 kg/m  02/12/17 29.95 kg/m   Wt Readings from Last 4 Encounters:  04/23/17 185 lb (83.9 kg)  04/03/17 189 lb (85.7 kg)  03/05/17 180 lb (81.6 kg)  02/12/17 180 lb (81.6 kg)  Psych/Mental status: Alert, oriented x 3 (person, place, & time)       Eyes: PERLA Respiratory: No evidence of acute respiratory distress  Cervical Spine Area Exam  Skin & Axial Inspection: No masses, redness, edema, swelling, or associated skin lesions Alignment: Symmetrical Functional ROM: Unrestricted ROM      Stability: No instability detected Muscle Tone/Strength: Functionally intact. No obvious neuro-muscular anomalies detected. Sensory (Neurological): Unimpaired Palpation: No palpable anomalies              Upper Extremity (UE) Exam    Side: Right upper extremity  Side: Left upper extremity  Skin & Extremity Inspection: Skin color, temperature, and hair growth are WNL. No peripheral edema or cyanosis. No masses, redness, swelling, asymmetry, or associated skin lesions. No contractures.  Skin & Extremity Inspection: Skin color, temperature, and hair growth  are WNL. No peripheral edema or cyanosis. No masses, redness, swelling, asymmetry, or associated skin lesions. No contractures.  Functional ROM: Unrestricted ROM          Functional ROM: Unrestricted ROM          Muscle Tone/Strength: Functionally intact. No obvious neuro-muscular anomalies detected.  Muscle Tone/Strength: Functionally intact. No obvious neuro-muscular anomalies detected.  Sensory (Neurological): Unimpaired          Sensory (Neurological): Unimpaired          Palpation: No palpable anomalies              Palpation: No palpable anomalies              Specialized Test(s): Deferred         Specialized Test(s): Deferred          Thoracic Spine Area Exam  Skin & Axial Inspection: No masses, redness, or swelling Alignment: Symmetrical Functional ROM: Unrestricted  ROM Stability: No instability detected Muscle Tone/Strength: Functionally intact. No obvious neuro-muscular anomalies detected. Sensory (Neurological): Unimpaired Muscle strength & Tone: No palpable anomalies  Lumbar Spine Area Exam  Skin & Axial Inspection: No masses, redness, or swelling Alignment: Symmetrical Functional ROM: Decreased ROM      Stability: No instability detected Muscle Tone/Strength: Functionally intact. No obvious neuro-muscular anomalies detected. Sensory (Neurological): Movement-associated pain Palpation: Complains of area being tender to palpation       Provocative Tests: Lumbar Hyperextension and rotation test: Positive bilaterally for facet joint pain. Lumbar Lateral bending test: evaluation deferred today       Patrick's Maneuver: evaluation deferred today                    Gait & Posture Assessment  Ambulation: Patient ambulates using a walker Gait: Very limited, using assistive device to ambulate Posture: Difficulty standing up straight, due to pain   Lower Extremity Exam    Side: Right lower extremity  Side: Left lower extremity  Skin & Extremity Inspection: Skin color,  temperature, and hair growth are WNL. No peripheral edema or cyanosis. No masses, redness, swelling, asymmetry, or associated skin lesions. No contractures.  Skin & Extremity Inspection: Skin color, temperature, and hair growth are WNL. No peripheral edema or cyanosis. No masses, redness, swelling, asymmetry, or associated skin lesions. No contractures.  Functional ROM: Decreased ROM for hip and knee joints  Functional ROM: Unrestricted ROM          Muscle Tone/Strength: Functionally intact. No obvious neuro-muscular anomalies detected.  Muscle Tone/Strength: Functionally intact. No obvious neuro-muscular anomalies detected.  Sensory (Neurological): Unimpaired  Sensory (Neurological): Unimpaired  Palpation: Complains of area being tender to palpation  Palpation: No palpable anomalies   Assessment  Primary Diagnosis & Pertinent Problem List: The primary encounter diagnosis was Chronic low back pain (Tertiary Area of Pain) (Bilateral) (L>R). 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  Status Diagnosis  Persistent Worsening Improved 1. Chronic low back pain [facility] Area of Pain) (Bilateral) (L>R)   2. Chronic hip pain (Right)   3. Chronic knee pain (Primary Area of Pain) (Right)   4. 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 Lumbar facet arthropathy   6. DDD (degenerative disc disease), lumbar   7. 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 Chronic neck  pain (Secondary Area of Pain) (Bilateral) (L>R)   10. Chronic pain syndrome   11. Pharmacologic therapy   12. CKD (chronic kidney disease) stage 3, GFR 30-59 ml/min (HCC)     Problems updated and reviewed during this visit: Problem  Ckd (Chronic Kidney Disease) Stage 3, Gfr 30-59 Ml/Min (Hcc)   Plan of Care  Pharmacotherapy (Medications Ordered): Meds ordered this encounter  Medications  . orphenadrine (NORFLEX) injection 30 mg  . HYDROcodone-acetaminophen (NORCO/VICODIN) 5-325 MG tablet    Sig: Take 1 tablet by mouth every 8 (eight) hours.    Dispense:  90 tablet    Refill:  0    Do not  place this medication, or any other prescription from our practice, on "Automatic Refill". Patient may have prescription filled one day early if pharmacy is closed on scheduled refill date. Do not fill until: 04/23/17 To last until: 05/23/17  This SmartLink is deprecated. Use AVSMEDLIST instead to display the medication list for a patient. Medications administered today: We administered orphenadrine.   Procedure Orders     Radiofrequency,Genicular     LUMBAR FACET(MEDIAL BRANCH NERVE BLOCK) [facility] Lab Orders  No laboratory test(s) ordered today   Imaging Orders  No imaging studies ordered today   Referral Orders  No referral(s) requested today    Interventional management options: Planned, scheduled, and/or pending:   Diagnostic bilateral lumbar facet block under fluoroscopic guidance and IV sedation Therapeutic right-sided genicular nerve RFA  Today we will discontinue her oxycodone and start patient on hydrocodone, which she claims has worked better for her in the past.  Today we will also give her an IM injection of Norflex 30 mg for her right hip pain.    Considering:   Diagnostic right-sided intra-articular knee joint injection  Possible series of 5 right-sided intra-articular Hyalgan knee injections  Diagnostic right-sided genicular nerve block  Possible right-sided genicular  nerve RFA  Diagnostic left cervical epidural steroid injection  Diagnostic bilateral cervical facet blocks  Diagnostic bilateral intra-articular shoulder joint injection  Diagnostic bilateral suprascapular nerve block  Possible bilateral suprascapular nerve RFA  Diagnostic bilateral lumbar facet block  Possible bilateral lumbar facet RFA    Palliative PRN treatment(s):   None at this time   Provider-requested follow-up: Return for Procedure (w/ sedation): (B) L-FCT BLK, RFA (fluoro + sedation): (R) Genicular.  Future Appointments  Date Time Provider [place]  06/10/2017 11:30 AM Penumalli, Earlean Polka, MD [facility] None   Primary Care Physician: Wenda Low, MD Location: [facility] Note by: Gaspar Cola, MD Date: 04/23/2017; Time: 9:17 AM

## 2017-04-23 ENCOUNTER — Ambulatory Visit: Payer: Medicare Other | Attending: Pain Medicine | Admitting: Pain Medicine

## 2017-04-23 ENCOUNTER — Encounter: Payer: Self-pay | Admitting: Pain Medicine

## 2017-04-23 ENCOUNTER — Telehealth: Payer: Self-pay

## 2017-04-23 ENCOUNTER — Other Ambulatory Visit: Payer: Self-pay

## 2017-04-23 VITALS — BP 177/107 | HR 61 | Temp 98.0°F | Resp 18 | Ht 60.0 in | Wt 185.0 lb

## 2017-04-23 DIAGNOSIS — Z882 Allergy status to sulfonamides status: Secondary | ICD-10-CM | POA: Insufficient documentation

## 2017-04-23 DIAGNOSIS — Z87891 Personal history of nicotine dependence: Secondary | ICD-10-CM | POA: Diagnosis not present

## 2017-04-23 DIAGNOSIS — M545 Low back pain, unspecified: Secondary | ICD-10-CM

## 2017-04-23 DIAGNOSIS — K219 Gastro-esophageal reflux disease without esophagitis: Secondary | ICD-10-CM | POA: Insufficient documentation

## 2017-04-23 DIAGNOSIS — E538 Deficiency of other specified B group vitamins: Secondary | ICD-10-CM | POA: Diagnosis not present

## 2017-04-23 DIAGNOSIS — I129 Hypertensive chronic kidney disease with stage 1 through stage 4 chronic kidney disease, or unspecified chronic kidney disease: Secondary | ICD-10-CM | POA: Insufficient documentation

## 2017-04-23 DIAGNOSIS — M25561 Pain in right knee: Secondary | ICD-10-CM

## 2017-04-23 DIAGNOSIS — Z888 Allergy status to other drugs, medicaments and biological substances status: Secondary | ICD-10-CM | POA: Insufficient documentation

## 2017-04-23 DIAGNOSIS — Z8619 Personal history of other infectious and parasitic diseases: Secondary | ICD-10-CM | POA: Insufficient documentation

## 2017-04-23 DIAGNOSIS — M542 Cervicalgia: Secondary | ICD-10-CM

## 2017-04-23 DIAGNOSIS — Z880 Allergy status to penicillin: Secondary | ICD-10-CM | POA: Diagnosis not present

## 2017-04-23 DIAGNOSIS — Z9049 Acquired absence of other specified parts of digestive tract: Secondary | ICD-10-CM | POA: Insufficient documentation

## 2017-04-23 DIAGNOSIS — G8929 Other chronic pain: Secondary | ICD-10-CM | POA: Diagnosis not present

## 2017-04-23 DIAGNOSIS — N183 Chronic kidney disease, stage 3 unspecified: Secondary | ICD-10-CM | POA: Insufficient documentation

## 2017-04-23 DIAGNOSIS — Z79899 Other long term (current) drug therapy: Secondary | ICD-10-CM | POA: Diagnosis not present

## 2017-04-23 DIAGNOSIS — M47816 Spondylosis without myelopathy or radiculopathy, lumbar region: Secondary | ICD-10-CM | POA: Insufficient documentation

## 2017-04-23 DIAGNOSIS — M25551 Pain in right hip: Secondary | ICD-10-CM | POA: Insufficient documentation

## 2017-04-23 DIAGNOSIS — Z9889 Other specified postprocedural states: Secondary | ICD-10-CM | POA: Insufficient documentation

## 2017-04-23 DIAGNOSIS — Z96651 Presence of right artificial knee joint: Secondary | ICD-10-CM | POA: Diagnosis not present

## 2017-04-23 DIAGNOSIS — F329 Major depressive disorder, single episode, unspecified: Secondary | ICD-10-CM | POA: Diagnosis not present

## 2017-04-23 DIAGNOSIS — M5136 Other intervertebral disc degeneration, lumbar region: Secondary | ICD-10-CM | POA: Diagnosis not present

## 2017-04-23 DIAGNOSIS — G894 Chronic pain syndrome: Secondary | ICD-10-CM | POA: Insufficient documentation

## 2017-04-23 DIAGNOSIS — Z885 Allergy status to narcotic agent status: Secondary | ICD-10-CM | POA: Diagnosis not present

## 2017-04-23 DIAGNOSIS — E785 Hyperlipidemia, unspecified: Secondary | ICD-10-CM | POA: Insufficient documentation

## 2017-04-23 DIAGNOSIS — Z881 Allergy status to other antibiotic agents status: Secondary | ICD-10-CM | POA: Insufficient documentation

## 2017-04-23 DIAGNOSIS — Z811 Family history of alcohol abuse and dependence: Secondary | ICD-10-CM | POA: Insufficient documentation

## 2017-04-23 DIAGNOSIS — J449 Chronic obstructive pulmonary disease, unspecified: Secondary | ICD-10-CM | POA: Diagnosis not present

## 2017-04-23 DIAGNOSIS — M810 Age-related osteoporosis without current pathological fracture: Secondary | ICD-10-CM | POA: Insufficient documentation

## 2017-04-23 DIAGNOSIS — Z91048 Other nonmedicinal substance allergy status: Secondary | ICD-10-CM | POA: Insufficient documentation

## 2017-04-23 DIAGNOSIS — Z8673 Personal history of transient ischemic attack (TIA), and cerebral infarction without residual deficits: Secondary | ICD-10-CM | POA: Insufficient documentation

## 2017-04-23 DIAGNOSIS — Z8249 Family history of ischemic heart disease and other diseases of the circulatory system: Secondary | ICD-10-CM | POA: Insufficient documentation

## 2017-04-23 MED ORDER — HYDROCODONE-ACETAMINOPHEN 5-325 MG PO TABS: 1.0000 | ORAL_TABLET | Freq: Three times a day (TID) | ORAL | 0 refills | Status: DC

## 2017-04-23 MED ORDER — ORPHENADRINE CITRATE 30 MG/ML IJ SOLN
30.0000 mg | Freq: Once | INTRAMUSCULAR | Status: AC
  Administered 2017-04-23: 30 mg via INTRAMUSCULAR
  Filled 2017-04-23: qty 2

## 2017-04-23 NOTE — Telephone Encounter (Signed)
Spoke with patient re; [name] and let her know that we do not prescribe this.  She does have Baclofen but this had many side effects with this medication, including "jerking".  She was seen today for a norflex injection and the norflex is working well.  I told her that I would forward back to NP to see if there is anything else we can prescribe.

## 2017-04-23 NOTE — Telephone Encounter (Signed)
We do not prescribe this.

## 2017-04-23 NOTE — Patient Instructions (Addendum)
____________________________________________________________________________________________  Preparing for Procedure with Sedation Instructions: . Oral Intake: Do not eat or drink anything for at least 8 hours prior to your procedure. . Transportation: Public transportation is not allowed. Bring an adult driver. The driver must be physically present in our waiting room before any procedure can be started. Marland Kitchen Physical Assistance: Bring an adult physically capable of assisting you, in the event you need help. This adult should keep you company at home for at least 6 hours after the procedure. . Blood Pressure Medicine: Take your blood pressure medicine with a sip of water the morning of the procedure. . Blood thinners:  . Diabetics on insulin: Notify the staff so that you can be scheduled 1st case in the morning. If your diabetes requires high dose insulin, take only  of your normal insulin dose the morning of the procedure and notify the staff that you have done so. . Preventing infections: Shower with an antibacterial soap the morning of your procedure. . Build-up your immune system: Take 1000 mg of Vitamin C with every meal (3 times a day) the day prior to your procedure. Marland Kitchen Antibiotics: Inform the staff if you have a condition or reason that requires you to take antibiotics before dental procedures. . Pregnancy: If you are pregnant, call and cancel the procedure. . Sickness: If you have a cold, fever, or any active infections, call and cancel the procedure. . Arrival: You must be in the facility at least 30 minutes prior to your scheduled procedure. . Children: Do not bring children with you. . Dress appropriately: Bring dark clothing that you would not mind if they get stained. . Valuables: Do not bring any jewelry or valuables. Procedure appointments are reserved for interventional treatments only. Marland Kitchen No Prescription Refills. . No medication changes will be discussed during procedure  appointments. . No disability issues will be discussed. ____________________________________________________________________________________________   GENERAL RISKS AND COMPLICATIONS  What are the risk, side effects and possible complications? Generally speaking, most procedures are safe.  However, with any procedure there are risks, side effects, and the possibility of complications.  The risks and complications are dependent upon the sites that are lesioned, or the type of nerve block to be performed.  The closer the procedure is to the spine, the more serious the risks are.  Great care is taken when placing the radio frequency needles, block needles or lesioning probes, but sometimes complications can occur. 1. Infection: Any time there is an injection through the skin, there is a risk of infection.  This is why sterile conditions are used for these blocks.  There are four possible types of infection. 1. Localized skin infection. 2. Central Nervous System Infection-This can be in the form of Meningitis, which can be deadly. 3. Epidural Infections-This can be in the form of an epidural abscess, which can cause pressure inside of the spine, causing compression of the spinal cord with subsequent paralysis. This would require an emergency surgery to decompress, and there are no guarantees that the patient would recover from the paralysis. 4. Discitis-This is an infection of the intervertebral discs.  It occurs in about 1% of discography procedures.  It is difficult to treat and it may lead to surgery.        2. Pain: the needles have to go through skin and soft tissues, will cause soreness.       3. Damage to internal structures:  The nerves to be lesioned may be near blood vessels or  other nerves which can be potentially damaged.       4. Bleeding: Bleeding is more common if the patient is taking blood thinners such as  aspirin, Coumadin, Ticiid, Plavix, etc., or if he/she have some genetic  predisposition  such as hemophilia. Bleeding into the spinal canal can cause compression of the spinal  cord with subsequent paralysis.  This would require an emergency surgery to  decompress and there are no guarantees that the patient would recover from the  paralysis.       5. Pneumothorax:  Puncturing of a lung is a possibility, every time a needle is introduced in  the area of the chest or upper back.  Pneumothorax refers to free air around the  collapsed lung(s), inside of the thoracic cavity (chest cavity).  Another two possible  complications related to a similar event would include: Hemothorax and Chylothorax.   These are variations of the Pneumothorax, where instead of air around the collapsed  lung(s), you may have blood or chyle, respectively.       6. Spinal headaches: They may occur with any procedures in the area of the spine.       7. Persistent CSF (Cerebro-Spinal Fluid) leakage: This is a rare problem, but may occur  with prolonged intrathecal or epidural catheters either due to the formation of a fistulous  track or a dural tear.       8. Nerve damage: By working so close to the spinal cord, there is always a possibility of  nerve damage, which could be as serious as a permanent spinal cord injury with  paralysis.       9. Death:  Although rare, severe deadly allergic reactions known as "Anaphylactic  reaction" can occur to any of the medications used.      10. Worsening of the symptoms:  We can always make thing worse.  What are the chances of something like this happening? Chances of any of this occuring are extremely low.  By statistics, you have more of a chance of getting killed in a motor vehicle accident: while driving to the hospital than any of the above occurring .  Nevertheless, you should be aware that they are possibilities.  In general, it is similar to taking a shower.  Everybody knows that you can slip, hit your head and get killed.  Does that mean that you should not  shower again?  Nevertheless always keep in mind that statistics do not mean anything if you happen to be on the wrong side of them.  Even if a procedure has a 1 (one) in a 1,000,000 (million) chance of going wrong, it you happen to be that one..Also, keep in mind that by statistics, you have more of a chance of having something go wrong when taking medications.  Who should not have this procedure? If you are on a blood thinning medication (e.g. Coumadin, Plavix, see list of "Blood Thinners"), or if you have an active infection going on, you should not have the procedure.  If you are taking any blood thinners, please inform your physician.  How should I prepare for this procedure?  Do not eat or drink anything at least six hours prior to the procedure.  Bring a driver with you .  It cannot be a taxi.  Come accompanied by an adult that can drive you back, and that is strong enough to help you if your legs get weak or numb from the local anesthetic.  Take  all of your medicines the morning of the procedure with just enough water to swallow them.  If you have diabetes, make sure that you are scheduled to have your procedure done first thing in the morning, whenever possible.  If you have diabetes, take only half of your insulin dose and notify our nurse that you have done so as soon as you arrive at the clinic.  If you are diabetic, but only take blood sugar pills (oral hypoglycemic), then do not take them on the morning of your procedure.  You may take them after you have had the procedure.  Do not take aspirin or any aspirin-containing medications, at least eleven (11) days prior to the procedure.  They may prolong bleeding.  Wear loose fitting clothing that may be easy to take off and that you would not mind if it got stained with Betadine or blood.  Do not wear any jewelry or perfume  Remove any nail coloring.  It will interfere with some of our monitoring equipment.  NOTE: Remember that  this is not meant to be interpreted as a complete list of all possible complications.  Unforeseen problems may occur.  BLOOD THINNERS The following drugs contain aspirin or other products, which can cause increased bleeding during surgery and should not be taken for 2 weeks prior to and 1 week after surgery.  If you should need take something for relief of minor pain, you may take acetaminophen which is found in Tylenol,m Datril, Anacin-3 and Panadol. It is not blood thinner. The products listed below are.  Do not take any of the products listed below in addition to any listed on your instruction sheet.  A.P.C or A.P.C with Codeine Codeine Phosphate Capsules #3 Ibuprofen Ridaura  ABC compound Congesprin Imuran rimadil  Advil Cope Indocin Robaxisal  Alka-Seltzer Effervescent Pain Reliever and Antacid Coricidin or Coricidin-D  Indomethacin Rufen  Alka-Seltzer plus Cold Medicine Cosprin Ketoprofen S-A-C Tablets  Anacin Analgesic Tablets or Capsules Coumadin Korlgesic Salflex  Anacin Extra Strength Analgesic tablets or capsules CP-2 Tablets Lanoril Salicylate  Anaprox Cuprimine Capsules Levenox Salocol  Anexsia-D Dalteparin Magan Salsalate  Anodynos Darvon compound Magnesium Salicylate Sine-off  Ansaid Dasin Capsules Magsal Sodium Salicylate  Anturane Depen Capsules Marnal Soma  APF Arthritis pain formula Dewitt's Pills Measurin Stanback  Argesic Dia-Gesic Meclofenamic Sulfinpyrazone  Arthritis Bayer Timed Release Aspirin Diclofenac Meclomen Sulindac  Arthritis pain formula Anacin Dicumarol Medipren Supac  Analgesic (Safety coated) Arthralgen Diffunasal Mefanamic Suprofen  Arthritis Strength Bufferin Dihydrocodeine Mepro Compound Suprol  Arthropan liquid Dopirydamole Methcarbomol with Aspirin Synalgos  ASA tablets/Enseals Disalcid Micrainin Tagament  Ascriptin Doan's Midol Talwin  Ascriptin A/D Dolene Mobidin Tanderil  Ascriptin Extra Strength Dolobid Moblgesic Ticlid  Ascriptin with Codeine  Doloprin or Doloprin with Codeine Momentum Tolectin  Asperbuf Duoprin Mono-gesic Trendar  Aspergum Duradyne Motrin or Motrin IB Triminicin  Aspirin plain, buffered or enteric coated Durasal Myochrisine Trigesic  Aspirin Suppositories Easprin Nalfon Trillsate  Aspirin with Codeine Ecotrin Regular or Extra Strength Naprosyn Uracel  Atromid-S Efficin Naproxen Ursinus  Auranofin Capsules Elmiron Neocylate Vanquish  Axotal Emagrin Norgesic Verin  Azathioprine Empirin or Empirin with Codeine Normiflo Vitamin E  Azolid Emprazil Nuprin Voltaren  Bayer Aspirin plain, buffered or children's or timed BC Tablets or powders Encaprin Orgaran Warfarin Sodium  Buff-a-Comp Enoxaparin Orudis Zorpin  Buff-a-Comp with Codeine Equegesic Os-Cal-Gesic   Buffaprin Excedrin plain, buffered or Extra Strength Oxalid   Bufferin Arthritis Strength Feldene Oxphenbutazone   Bufferin plain or Extra Strength Feldene Capsules  Oxycodone with Aspirin   Bufferin with Codeine Fenoprofen Fenoprofen Pabalate or Pabalate-SF   Buffets II Flogesic Panagesic   Buffinol plain or Extra Strength Florinal or Florinal with Codeine Panwarfarin   Buf-Tabs Flurbiprofen Penicillamine   Butalbital Compound Four-way cold tablets Penicillin   Butazolidin Fragmin Pepto-Bismol   Carbenicillin Geminisyn Percodan   Carna Arthritis Reliever Geopen Persantine   Carprofen Gold's salt Persistin   Chloramphenicol Goody's Phenylbutazone   Chloromycetin Haltrain Piroxlcam   Clmetidine heparin Plaquenil   Cllnoril Hyco-pap Ponstel   Clofibrate Hydroxy chloroquine Propoxyphen         Before stopping any of these medications, be sure to consult the physician who ordered them.  Some, such as Coumadin (Warfarin) are ordered to prevent or treat serious conditions such as "deep thrombosis", "pumonary embolisms", and other heart problems.  The amount of time that you may need off of the medication may also vary with the medication and the reason for which  you were taking it.  If you are taking any of these medications, please make sure you notify your pain physician before you undergo any procedures.          Facet Joint Block The facet joints connect the bones of the spine (vertebrae). They make it possible for you to bend, twist, and make other movements with your spine. They also keep you from bending too far, twisting too far, and making other excessive movements. A facet joint block is a procedure where a numbing medicine (anesthetic) is injected into a facet joint. Often, a type of anti-inflammatory medicine called a steroid is also injected. A facet joint block may be done to diagnose neck or back pain. If the pain gets better after a facet joint block, it means the pain is probably coming from the facet joint. If the pain does not get better, it means the pain is probably not coming from the facet joint. A facet joint block may also be done to relieve neck or back pain caused by an inflamed facet joint. A facet joint block is only done to relieve pain if the pain does not improve with other methods, such as medicine, exercise programs, and physical therapy. Tell a health care provider about:  Any allergies you have.  All medicines you are taking, including vitamins, herbs, eye drops, creams, and over-the-counter medicines.  Any problems you or family members have had with anesthetic medicines.  Any blood disorders you have.  Any surgeries you have had.  Any medical conditions you have.  Whether you are pregnant or may be pregnant. What are the risks? Generally, this is a safe procedure. However, problems may occur, including:  Bleeding.  Injury to a nerve near the injection site.  Pain at the injection site.  Weakness or numbness in areas controlled by nerves near the injection site.  Infection.  Temporary fluid retention.  Allergic reactions to medicines or dyes.  Injury to other structures or organs near the  injection site.  What happens before the procedure?  Follow instructions from your health care provider about eating or drinking restrictions.  Ask your health care provider about: ? Changing or stopping your regular medicines. This is especially important if you are taking diabetes medicines or blood thinners. ? Taking medicines such as aspirin and ibuprofen. These medicines can thin your blood. Do not take these medicines before your procedure if your health care provider instructs you not to.  Do not take any new dietary supplements or medicines without asking  your health care provider first.  Plan to have someone take you home after the procedure. What happens during the procedure?  You may need to remove your clothing and dress in an open-back gown.  The procedure will be done while you are lying on an X-ray table. You will most likely be asked to lie on your stomach, but you may be asked to lie in a different position if an injection will be made in your neck.  Machines will be used to monitor your oxygen levels, heart rate, and blood pressure.  If an injection will be made in your neck, an IV tube will be inserted into one of your veins. Fluids and medicine will flow directly into your body through the IV tube.  The area over the facet joint where the injection will be made will be cleaned with soap. The surrounding skin will be covered with clean drapes.  A numbing medicine (local anesthetic) will be applied to your skin. Your skin may sting or burn for a moment.  A video X-ray machine (fluoroscopy) will be used to locate the joint. In some cases, a CT scan may be used.  A contrast dye may be injected into the facet joint area to help locate the joint.  When the joint is located, an anesthetic will be injected into the joint through the needle.  Your health care provider will ask you whether you feel pain relief. If you do feel relief, a steroid may be injected to provide  pain relief for a longer period of time. If you do not feel relief or feel only partial relief, additional injections of an anesthetic may be made in other facet joints.  The needle will be removed.  Your skin will be cleaned.  A bandage (dressing) will be applied over each injection site. The procedure may vary among health care providers and hospitals. What happens after the procedure?  You will be observed for 15-30 minutes before being allowed to go home. This information is not intended to replace advice given to you by your health care provider. Make sure you discuss any questions you have with your health care provider. Document Released: 10/02/2006 Document Revised: 06/14/2015 Document Reviewed: 02/06/2015 Elsevier Interactive Patient Education  [employer].

## 2017-04-23 NOTE — Progress Notes (Signed)
Nursing Pain Medication Assessment:  Safety precautions to be maintained throughout the outpatient stay will include: orient to surroundings, keep bed in low position, maintain call bell within reach at all times, provide assistance with transfer out of bed and ambulation.  Medication Inspection Compliance: Pill count conducted under aseptic conditions, in front of the patient. Neither the pills nor the bottle was removed from the patient's sight at any time. Once count was completed pills were immediately returned to the patient in their original bottle.  Medication: See above Pill/Patch Count: 78 of 90 pills remain Pill/Patch Appearance: Markings consistent with prescribed medication Bottle Appearance: Standard pharmacy container. Clearly labeled. Filled Date: 74 / 19 / 2018 Last Medication intake:  Doesnt take, they dont work   Wasted to commode witness by myself, patient, and [employer]

## 2017-04-24 ENCOUNTER — Other Ambulatory Visit: Payer: Self-pay | Admitting: Nurse Practitioner

## 2017-04-24 MED ORDER — ORPHENADRINE CITRATE ER 100 MG PO TB12: 100.0000 mg | ORAL_TABLET | Freq: Two times a day (BID) | ORAL | 1 refills | Status: DC

## 2017-04-24 NOTE — Telephone Encounter (Signed)
Spoke with patient to let her know that [facility] has called in the Norflex 100 mg bid prn and to stop taking Baclofen.

## 2017-04-24 NOTE — Telephone Encounter (Signed)
I have sent in the Norflex. Tell her to discontinue the use of the Baclofen.

## 2017-05-14 ENCOUNTER — Telehealth: Payer: Self-pay

## 2017-05-14 NOTE — Telephone Encounter (Signed)
[place] [facility] called stating that patients prescription for movantik is not covered by her plan. The preferred alternative is ( amitiza, lactulose, constulose, and generlac) if movantik is the only medication patient can take, call the PA line to get it approved [id number].

## 2017-05-14 NOTE — Telephone Encounter (Signed)
Patient has been discharged from clinic

## 2017-05-16 NOTE — Telephone Encounter (Signed)
Avoid doing this again.  If any patients asks for Soma or any Benzodiazepine, the answer is: ABSOLUTELY NOT!!! NOT EVER!!! If patient needs or wants a medication change or adjustments, bring them in for an appointment. Remember my rule: NO PHONE OR FAX REFILLS!!! NO CHANGES IN MEDICATION OVER THE PHONE!!! I do not like them to be making changes over the phone. Avoid starting any medications on a phone call. I do not consider this back and forth electronic communication, without face-to-face encounter with patient, to be a good practice. Do not do this with my patients. Bring them in.

## 2017-05-29 ENCOUNTER — Other Ambulatory Visit: Payer: Self-pay | Admitting: Student

## 2017-05-29 DIAGNOSIS — R1310 Dysphagia, unspecified: Secondary | ICD-10-CM

## 2017-06-04 ENCOUNTER — Ambulatory Visit: Payer: Medicare Other

## 2017-06-10 ENCOUNTER — Ambulatory Visit: Payer: Medicare Other | Admitting: Diagnostic Neuroimaging

## 2017-06-16 ENCOUNTER — Ambulatory Visit: Payer: Medicare Other | Attending: Student | Admitting: Physical Therapy

## 2017-06-16 ENCOUNTER — Ambulatory Visit: Payer: Medicare Other

## 2017-06-17 ENCOUNTER — Ambulatory Visit: Payer: Medicare Other | Admitting: Pain Medicine

## 2017-06-17 NOTE — Progress Notes (Deleted)
Patient's Name: [name]  MRN: [id number]  Referring Provider: Milinda Pointer, MD  DOB: 07-Oct-1940  PCP: Wenda Low, MD  DOS: 06/17/2017  Note by: Gaspar Cola, MD  Service setting: Ambulatory outpatient  Specialty: Interventional Pain Management  Patient type: Established  Location: [facility] (AMB) Pain Management Facility  Visit type: Interventional Procedure   Primary Reason for Visit: Interventional Pain Management Treatment. CC: No chief complaint on file.  Procedure:  Anesthesia, Analgesia, Anxiolysis:  Type: Therapeutic Superior-lateral, Superior-medial, and Inferior-medial, Genicular Nerve Radiofrequency Ablation. Region: Lateral, Anterior, and Medial aspects of the knee joint, above and below the knee joint proper. Level: Superior and inferior to the knee joint. Laterality: Right  Type: Local Anesthesia with Moderate (Conscious) Sedation Local Anesthetic: Lidocaine 1% Route: Intravenous (IV) IV Access: Secured Sedation: Meaningful verbal contact was maintained at all times during the procedure  Indication(s): Analgesia and Anxiety   Indications: 1. Chronic knee pain (Primary Area of Pain) (Right)   2. 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 Hx of total knee replacement (Bilateral)    [name] has either failed to respond, was unable to tolerate, or simply did not get enough benefit from other more conservative therapies including, but not limited to: 1. Over-the-counter medications 2. Anti-inflammatory medications 3. Muscle relaxants 4. Membrane stabilizers 5. Opioids 6. Physical therapy 7. Modalities (Heat, ice, etc.) 8. Invasive techniques such as nerve blocks. [name] has attained more than 50% relief of the pain from a series of diagnostic injections conducted in separate occasions.  Pain Score: Pre-procedure:  /10 Post-procedure:  /10  Pre-op Assessment:  [name] is a 77 y.o. (year old), female patient, seen today  for interventional treatment. She  has a past surgical history that includes Abdominal surgery; Colon surgery; Joint replacement; Appendectomy; Bladder surgery; Rectocele repair; Knee surgery; Cholecystectomy; Abdominal hysterectomy; Ostomy; Eye surgery; Intramedullary (im) nail intertrochanteric (Right, 09/13/2016); and Hip surgery (Right, 08/2016). [name] has a current medication list which includes the following prescription(s): albuterol, albuterol, alprazolam, amlodipine, baclofen, calcium-vitamin d-vitamin k, citalopram, diclofenac sodium, diphenhydramine, docusate sodium, esomeprazole, fexofenadine hcl, gabapentin, ginkgo biloba, hydrocodone-acetaminophen, lovastatin, meloxicam, naloxegol oxalate, omega-3 fish oil, ondansetron, orphenadrine, polyethylene glycol powder, prolia, promethazine, senna, and trazodone. Her primarily concern today is the No chief complaint on file.  Initial Vital Signs: There were no vitals taken for this visit. BMI: Estimated body mass index is 36.13 kg/m as calculated from the following:   Height as of 04/23/17: 5' (1.524 m).   Weight as of 04/23/17: 185 lb (83.9 kg).  Risk Assessment: Allergies: Reviewed. She is allergic to ampicillin-sulbactam sodium; ampicillin; toradol [ketorolac tromethamine]; tramadol; flexeril [cyclobenzaprine]; ambien [zolpidem tartrate]; cephalexin; ketorolac; percocet [oxycodone-acetaminophen]; sulfamethoxazole-trimethoprim; and tape.  Allergy Precautions: None required Coagulopathies: Reviewed. None identified.  Blood-thinner therapy: None at this time Active Infection(s): Reviewed. None identified. [name] is afebrile  Site Confirmation: [name] was asked to confirm the procedure and laterality before marking the site Procedure checklist: Completed Consent: Before the procedure and under the influence of no sedative(s), amnesic(s), or anxiolytics, the patient was informed of the treatment options, risks and possible  complications. To fulfill our ethical and legal obligations, as recommended by the American Medical Association's Code of Ethics, I have informed the patient of my clinical impression; the nature and purpose of the treatment or procedure; the risks, benefits, and possible complications of the intervention; the alternatives, including doing nothing; the risk(s) and benefit(s) of the alternative treatment(s) or procedure(s); and  the risk(s) and benefit(s) of doing nothing. The patient was provided information about the general risks and possible complications associated with the procedure. These may include, but are not limited to: failure to achieve desired goals, infection, bleeding, organ or nerve damage, allergic reactions, paralysis, and death. In addition, the patient was informed of those risks and complications associated to the procedure, such as failure to decrease pain; infection; bleeding; organ or nerve damage with subsequent damage to sensory, motor, and/or autonomic systems, resulting in permanent pain, numbness, and/or weakness of one or several areas of the body; allergic reactions; (i.e.: anaphylactic reaction); and/or death. Furthermore, the patient was informed of those risks and complications associated with the medications. These include, but are not limited to: allergic reactions (i.e.: anaphylactic or anaphylactoid reaction(s)); adrenal axis suppression; blood sugar elevation that in diabetics may result in ketoacidosis or comma; water retention that in patients with history of congestive heart failure may result in shortness of breath, pulmonary edema, and decompensation with resultant heart failure; weight gain; swelling or edema; medication-induced neural toxicity; particulate matter embolism and blood vessel occlusion with resultant organ, and/or nervous system infarction; and/or aseptic necrosis of one or more joints. Finally, the patient was informed that Medicine is not an exact  science; therefore, there is also the possibility of unforeseen or unpredictable risks and/or possible complications that may result in a catastrophic outcome. The patient indicated having understood very clearly. We have given the patient no guarantees and we have made no promises. Enough time was given to the patient to ask questions, all of which were answered to the patient's satisfaction. Ms. Colombe has indicated that she wanted to continue with the procedure. Attestation: I, the ordering provider, attest that I have discussed with the patient the benefits, risks, side-effects, alternatives, likelihood of achieving goals, and potential problems during recovery for the procedure that I have provided informed consent. Date: 06/17/2017; Time: 6:48 AM  Pre-Procedure Preparation:  Monitoring: As per clinic protocol. Respiration, ETCO2, SpO2, BP, heart rate and rhythm monitor placed and checked for adequate function Safety Precautions: Patient was assessed for positional comfort and pressure points before starting the procedure. Time-out: I initiated and conducted the "Time-out" before starting the procedure, as per protocol. The patient was asked to participate by confirming the accuracy of the "Time Out" information. Verification of the correct person, site, and procedure were performed and confirmed by me, the nursing staff, and the patient. "Time-out" conducted as per Joint Commission's Universal Protocol (UP.01.01.01). "Time-out" Date & Time: 06/17/2017;   hrs.  Description of Procedure Process:   Position: Supine Target Area: For Genicular Nerve block(s), the targets are: the superior-lateral genicular nerve, located in the lateral distal portion of the femoral shaft as it curves to form the lateral epicondyle, in the region of the distal femoral metaphysis; the superior-medial genicular nerve, located in the medial distal portion of the femoral shaft as it curves to form the medial epicondyle; and  the inferior-medial genicular nerve, located in the medial, proximal portion of the tibial shaft, as it curves to form the medial epicondyle, in the region of the proximal tibial metaphysis. Approach: Anterior, ipsilateral approach. Area Prepped: Entire knee area, from mid-thigh to mid-shin, lateral, anterior, and medial aspects. Prepping solution: Hibiclens (4.0% Chlorhexidine gluconate solution) Safety Precautions: Aspiration looking for blood return was conducted prior to all injections. At no point did we inject any substances, as a needle was being advanced. No attempts were made at seeking any paresthesias. Safe injection practices and  needle disposal techniques used. Medications properly checked for expiration dates. SDV (single dose vial) medications used. Description of the Procedure: Protocol guidelines were followed. The patient was placed in position over the procedure table. The target area was identified and the area prepped in the usual manner. The skin and muscle were infiltrated with local anesthetic. Appropriate amount of time allowed to pass for local anesthetics to take effect. Radiofrequency needles were introduced to the target area using fluoroscopic guidance. Using the NeuroTherm NT1100 Radiofrequency Generator, sensory stimulation using 50 Hz was used to locate & identify the nerve, making sure that the needle was positioned such that there was no sensory stimulation below 0.3 V or above 0.7 V. Stimulation using 2 Hz was used to evaluate the motor component. Care was taken not to lesion any nerves that demonstrated motor stimulation of the lower extremities at an output of less than 2.5 times that of the sensory threshold, or a maximum of 2.0 V. Once satisfactory placement of the needles was achieved, the numbing solution was slowly injected after negative aspiration. After waiting for at least 2 minutes, the ablation was performed at 80 degrees C for 60 seconds, using regular  Radiofrequency settings. Once the procedure was completed, the needles were then removed and the area cleansed, making sure to leave some of the prepping solution back to take advantage of its long term bactericidal properties. Intra-operative Compliance: Compliant There were no vitals filed for this visit.  Start Time:   hrs. End Time:   hrs. Materials & Medications:  Needle(s) Type: Teflon-coated, curved tip, Radiofrequency needle(s) Gauge: 22G Length: 10cm Medication(s): Jowanna L. Savard had no medications administered during this visit. Please see chart orders for dosing details.  Imaging Guidance (Non-Spinal):  Type of Imaging Technique: Fluoroscopy Guidance (Non-Spinal) Indication(s): Assistance in needle guidance and placement for procedures requiring needle placement in or near specific anatomical locations not easily accessible without such assistance. Exposure Time: Please see nurses notes. Contrast: Before injecting any contrast, we confirmed that the patient did not have an allergy to iodine, shellfish, or radiological contrast. Once satisfactory needle placement was completed at the desired level, radiological contrast was injected. Contrast injected under live fluoroscopy. No contrast complications. See chart for type and volume of contrast used. Fluoroscopic Guidance: I was personally present during the use of fluoroscopy. "Tunnel Vision Technique" used to obtain the best possible view of the target area. Parallax error corrected before commencing the procedure. "Direction-depth-direction" technique used to introduce the needle under continuous pulsed fluoroscopy. Once target was reached, antero-posterior, oblique, and lateral fluoroscopic projection used confirm needle placement in all planes. Images permanently stored in EMR. Interpretation: I personally interpreted the imaging intraoperatively. Adequate needle placement confirmed in multiple planes. Appropriate spread of contrast  into desired area was observed. No evidence of afferent or efferent intravascular uptake. Permanent images saved into the patient's record.  Antibiotic Prophylaxis:  Indication(s): None identified Antibiotic given: None  Post-operative Assessment:  EBL: None Complications: No immediate post-treatment complications observed by team, or reported by patient. Note: The patient tolerated the entire procedure well. A repeat set of vitals were taken after the procedure and the patient was kept under observation following institutional policy, for this type of procedure. Post-procedural neurological assessment was performed, showing return to baseline, prior to discharge. The patient was provided with post-procedure discharge instructions, including a section on how to identify potential problems. Should any problems arise concerning this procedure, the patient was given instructions to immediately contact us, at any  time, without hesitation. In any case, we plan to contact the patient by telephone for a follow-up status report regarding this interventional procedure. Comments:  No additional relevant information.  Plan of Care   Imaging Orders  No imaging studies ordered today   Procedure Orders    No procedure(s) ordered today    Medications ordered for procedure: No orders of the defined types were placed in this encounter.  Medications administered: Rudean L. Douds had no medications administered during this visit.  See the medical record for exact dosing, route, and time of administration.  New Prescriptions   No medications on file   Disposition: Discharge home  Discharge Date & Time: 06/17/2017;   hrs.   Physician-requested Follow-up: No Follow-up on file.  Future Appointments  Date Time Provider Kyle  06/17/2017  2:15 PM Milinda Pointer, MD [facility] None  06/26/2017  9:30 AM [id number]   Primary Care Physician: Wenda Low, MD Location:  [facility] Note by: Gaspar Cola, MD Date: 06/17/2017; Time: 6:48 AM  Disclaimer:  Medicine is not an Chief Strategy Officer. The only guarantee in medicine is that nothing is guaranteed. It is important to note that the decision to proceed with this intervention was based on the information collected from the patient. The Data and conclusions were drawn from the patient's questionnaire, the interview, and the physical examination. Because the information was provided in large part by the patient, it cannot be guaranteed that it has not been purposely or unconsciously manipulated. Every effort has been made to obtain as much relevant data as possible for this evaluation. It is important to note that the conclusions that lead to this procedure are derived in large part from the available data. Always take into account that the treatment will also be dependent on availability of resources and existing treatment guidelines, considered by other Pain Management Practitioners as being common knowledge and practice, at the time of the intervention. For Medico-Legal purposes, it is also important to point out that variation in procedural techniques and pharmacological choices are the acceptable norm. The indications, contraindications, technique, and results of the above procedure should only be interpreted and judged by a Board-Certified Interventional Pain Specialist with extensive familiarity and expertise in the same exact procedure and technique.

## 2017-06-23 ENCOUNTER — Ambulatory Visit: Payer: Medicare Other | Admitting: Physical Therapy

## 2017-06-25 ENCOUNTER — Emergency Department (HOSPITAL_COMMUNITY): Payer: Medicare Other

## 2017-06-25 ENCOUNTER — Emergency Department (HOSPITAL_COMMUNITY)
Admission: EM | Admit: 2017-06-25 | Discharge: 2017-06-25 | Disposition: A | Discharge status: Home / Self Care | Payer: Medicare Other | Attending: Emergency Medicine | Admitting: Emergency Medicine

## 2017-06-25 ENCOUNTER — Encounter (HOSPITAL_COMMUNITY): Payer: Self-pay | Admitting: Emergency Medicine

## 2017-06-25 DIAGNOSIS — M542 Cervicalgia: Secondary | ICD-10-CM | POA: Insufficient documentation

## 2017-06-25 DIAGNOSIS — Y999 Unspecified external cause status: Secondary | ICD-10-CM | POA: Insufficient documentation

## 2017-06-25 DIAGNOSIS — W06XXXA Fall from bed, initial encounter: Secondary | ICD-10-CM | POA: Diagnosis not present

## 2017-06-25 DIAGNOSIS — Y92003 Bedroom of unspecified non-institutional (private) residence as the place of occurrence of the external cause: Secondary | ICD-10-CM | POA: Diagnosis not present

## 2017-06-25 DIAGNOSIS — Z87891 Personal history of nicotine dependence: Secondary | ICD-10-CM | POA: Insufficient documentation

## 2017-06-25 DIAGNOSIS — M545 Low back pain: Secondary | ICD-10-CM | POA: Diagnosis not present

## 2017-06-25 DIAGNOSIS — J449 Chronic obstructive pulmonary disease, unspecified: Secondary | ICD-10-CM | POA: Insufficient documentation

## 2017-06-25 DIAGNOSIS — S0990XA Unspecified injury of head, initial encounter: Secondary | ICD-10-CM | POA: Diagnosis not present

## 2017-06-25 DIAGNOSIS — Y939 Activity, unspecified: Secondary | ICD-10-CM | POA: Diagnosis not present

## 2017-06-25 DIAGNOSIS — I129 Hypertensive chronic kidney disease with stage 1 through stage 4 chronic kidney disease, or unspecified chronic kidney disease: Secondary | ICD-10-CM | POA: Diagnosis not present

## 2017-06-25 DIAGNOSIS — M25551 Pain in right hip: Secondary | ICD-10-CM | POA: Diagnosis not present

## 2017-06-25 DIAGNOSIS — R000 Tachycardia, unspecified: Secondary | ICD-10-CM | POA: Diagnosis not present

## 2017-06-25 DIAGNOSIS — R52 Pain, unspecified: Secondary | ICD-10-CM

## 2017-06-25 DIAGNOSIS — W19XXXA Unspecified fall, initial encounter: Secondary | ICD-10-CM

## 2017-06-25 DIAGNOSIS — N183 Chronic kidney disease, stage 3 (moderate): Secondary | ICD-10-CM | POA: Diagnosis not present

## 2017-06-25 MED ORDER — MORPHINE SULFATE (PF) 4 MG/ML IV SOLN
4.0000 mg | Freq: Once | INTRAVENOUS | Status: AC
  Administered 2017-06-25: 4 mg via INTRAVENOUS
  Filled 2017-06-25: qty 1

## 2017-06-25 MED ORDER — LORAZEPAM 2 MG/ML IJ SOLN
1.0000 mg | Freq: Once | INTRAMUSCULAR | Status: AC
  Administered 2017-06-25: 1 mg via INTRAVENOUS
  Filled 2017-06-25: qty 1

## 2017-06-25 NOTE — ED Notes (Signed)
PT c/o itching in arm after morphine. It is now improving; MD notified. No airway issues or complaints. CT to watch for any worsening symptoms and will call RN

## 2017-06-25 NOTE — ED Notes (Signed)
MD at bedside to discuss results; water given.

## 2017-06-25 NOTE — ED Provider Notes (Signed)
[facility] EMERGENCY DEPARTMENT Provider Note   CSN: [id number] Arrival date & time: 06/25/17  9892     History   Chief Complaint Chief Complaint  Patient presents with  . Fall  . Hip Pain    HPI [name] is a 77 y.o. female.  Patient is a 77 year old female who presents after a fall.  She states that she had gotten out of bed and was trying to lean up against a wall but she was not as close to the wall she thought she was and fell onto the floor.  She states she did hit her head.  There is no loss of consciousness.  She does have chronic neck and back pain but states she is having some worsening pain in her neck and her low back.  She also complains of pain to her right hip.  She has had a prior intertrochanteric fracture of that hip last year.  She is on anticoagulants.  She has had 100 mcg of fentanyl per EMS in route with some improvement of the pain.  She denies any dizziness chest pain shortness of breath or other symptoms preceding the fall.  She states she just lost her balance and fell onto the floor.  She denies any other injuries.      Past Medical History:  Diagnosis Date  . Anemia   . B12 deficiency   . Bacteremia 10/07/2014  . Bladder incontinence   . Blind left eye   . Cataract   . Cervical spine fracture ([place])   . Chest pain 07/29/2013  . Compression fracture    C7,  upper T spine -compression fx  . COPD (chronic obstructive pulmonary disease) ([place])   . DDD (degenerative disc disease), lumbar   . Depression    major  . Diffuse myofascial pain syndrome 03/24/2015  . Fever 10/05/2014  . GERD (gastroesophageal reflux disease)   . Hiatal hernia   . Hyperlipidemia   . Hypertension   . 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 Macular degeneration   . Osteoarthritis   . Osteoporosis   . Reactive airway disease   . Rupture of bowel ([place])   . Sepsis ([place]) 10/08/2014  . Stroke [facility])    "mini stroke"  . Stroke ([place])   .  Wrist pain, acute 09/10/2012    Patient Active Problem List   Diagnosis Date Noted  . CKD (chronic kidney disease) stage 3, GFR 30-59 ml/min (HCC) 04/23/2017  . Chronic knee pain s/p total knee replacement (TKR) (Right) 04/02/2017  . DDD (degenerative disc disease), lumbar 03/05/2017  . Chronic knee pain (Bilateral) (R>L) 03/05/2017  . Osteoarthritis 03/05/2017  . Chronic musculoskeletal pain 03/05/2017  . Neurogenic pain 03/05/2017  . Chronic myofascial pain 02/12/2017  . Lumbar facet syndrome (Bilateral) (L>R) 02/12/2017  . Long term prescription benzodiazepine use 02/12/2017  . Opiate use 02/12/2017  . Disorder of skeletal system 02/12/2017  . Pharmacologic therapy 02/12/2017  . Problems influencing health status 02/12/2017  . 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 NSAID long-term use 02/12/2017  . DDD (degenerative disc disease), cervical 11/11/2016  . Lumbar facet arthropathy 11/11/2016  . 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 CAFL (chronic airflow limitation) ([place]) 11/07/2016  . Depression, major, in partial remission ([place]) 11/07/2016  . 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 Bed wetting 11/07/2016  . Long term current use of opiate analgesic 11/07/2016  . Long term prescription opiate use 11/07/2016  .  Chronic pain syndrome 11/07/2016  . Chronic neck pain (Secondary Area of Pain) (Bilateral) (L>R) 11/07/2016  . Chronic hip pain (Right) 11/07/2016  . Rib pain 11/07/2016  . Age-related osteoporosis with current pathological fracture with routine healing 09/20/2016  . History of stroke 09/20/2016  . Major depression in remission ([place]) 09/20/2016  . Hip fracture ([place]) 09/13/2016  . Displaced intertrochanteric fracture of right femur, initial encounter for closed fracture ([place]) 09/13/2016  . Fall   . Chronic shoulder pain (Bilateral) 08/12/2016  . Hx of total knee replacement (Bilateral) 04/04/2016  . Hoarseness  01/12/2016  . Pharyngoesophageal dysphagia 01/12/2016  . Chronic low back pain [facility]) (Bilateral) (L>R) 10/26/2015  . 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 UTI (urinary tract infection) 06/25/2015  . Syncope, non cardiac 05/22/2015  . Leukopenia 10/07/2014  . Thrombocytopenia ([place]) 10/07/2014  . Hypoxia 10/05/2014  . Anxiety 06/15/2014  . Apoplectic vertigo 06/15/2014  . Duodenogastric reflux 06/15/2014  . Benign essential HTN 06/15/2014  . Infection of the upper respiratory tract 06/15/2014  . Hypercholesteremia 07/30/2013  . HTN (hypertension) 07/30/2013  . GERD (gastroesophageal reflux disease) 07/30/2013  . Dizziness and giddiness 06/18/2013  . Memory loss 05/03/2013  . Chronic knee pain (Primary Area of Pain) (Right) 09/10/2012    Past Surgical History:  Procedure Laterality Date  . ABDOMINAL HYSTERECTOMY    . ABDOMINAL SURGERY    . APPENDECTOMY    . BLADDER SURGERY    . CHOLECYSTECTOMY    . COLON SURGERY    . EYE SURGERY     cataracts  . HIP SURGERY Right 08/2016  . INTRAMEDULLARY (IM) NAIL INTERTROCHANTERIC Right 09/13/2016   Procedure: INTRAMEDULLARY (IM) NAIL INTERTROCHANTRIC RIGHT;  Surgeon: Leandrew Koyanagi, MD;  Location: [facility];  Service: Orthopedics;  Laterality: Right;  . JOINT REPLACEMENT    . KNEE SURGERY    . OSTOMY    . RECTOCELE REPAIR      OB History    Gravida Para Term Preterm AB Living   0 0 0 0 0     SAB TAB Ectopic Multiple Live Births   0 0 0           Home Medications    Prior to Admission medications   Medication Sig Start Date End Date Taking? Authorizing Provider  albuterol (PROVENTIL HFA;VENTOLIN HFA) 108 (90 BASE) MCG/ACT inhaler Inhale 1 puff into the lungs every 6 (six) hours as needed for wheezing or shortness of breath.   Yes [provider]  albuterol (PROVENTIL) (2.5 MG/3ML) 0.083% nebulizer solution Take 2.5 mg by nebulization  every 6 (six) hours as needed for wheezing or shortness of breath.   Yes [provider]  ALPRAZolam Duanne Moron) 1 MG tablet Take 1 mg by mouth at bedtime as needed.  02/03/17  Yes [provider]  amLODipine (NORVASC) 5 MG tablet Take 5 mg by mouth daily.  04/06/13  Yes [provider]  Calcium-Vitamin D-Vitamin K (CALCIUM SOFT CHEWS PO) Take by mouth daily at 6 (six) AM.   Yes [provider]  citalopram (CELEXA) 20 MG tablet Take 20 mg by mouth daily.  02/27/17  Yes [provider]  diphenhydrAMINE (BENADRYL) 25 MG tablet Take 25 mg by mouth as needed.   Yes [provider]  docusate sodium (COLACE) 100 MG capsule Take 100 mg by mouth as needed for mild constipation.   Yes [provider]  esomeprazole (NEXIUM) 40 MG capsule Take 40 mg by mouth 2 (two) times daily before a meal.    Yes [provider]  Fexofenadine HCl (MUCINEX ALLERGY PO) Take by mouth as needed.   Yes [provider]  GINKGO BILOBA PO Take by mouth daily at 6 (six) AM.   Yes [provider]  lovastatin (MEVACOR) 10 MG tablet Take 10 mg by mouth daily. 09/22/14  Yes [provider]  meloxicam (MOBIC) 15 MG tablet Take 15 mg by mouth daily.  05/23/16  Yes [provider]  omega-3 fish oil (MAXEPA) 1000 MG CAPS capsule Take by mouth daily at 6 (six) AM.   Yes [provider]  ondansetron (ZOFRAN) 4 MG tablet Take 4 mg by mouth every 8 (eight) hours as needed for nausea.  02/29/16  Yes [provider]  orphenadrine (NORFLEX) 100 MG tablet Take 1 tablet (100 mg total) by mouth 2 (two) times daily. 04/24/17  Yes Vevelyn Francois, NP  polyethylene glycol powder (GLYCOLAX/MIRALAX) powder  11/28/16  Yes [provider]  promethazine (PHENERGAN) 12.5 MG suppository Place 12.5 mg rectally as needed for nausea or vomiting.   Yes [provider]  senna (SENOKOT) 8.6 MG TABS tablet Take 2 tablets by mouth.  constipation   Yes [provider]  traZODone (DESYREL) 100 MG tablet Take 50 mg by mouth at bedtime.    Yes [provider]  baclofen (LIORESAL) 10 MG tablet Take 1 tablet (10 mg total) by mouth daily. 03/05/17 06/03/17  Milinda Pointer, MD  diclofenac sodium (VOLTAREN) 1 % GEL Apply 2 g topically 4 (four) times daily. 03/05/17 06/03/17  Milinda Pointer, MD  gabapentin (NEURONTIN) 400 MG capsule Take 1 capsule (400 mg total) by mouth 4 (four) times daily. 03/05/17 06/03/17  Milinda Pointer, MD  HYDROcodone-acetaminophen (NORCO/VICODIN) 5-325 MG tablet Take 1 tablet by mouth every 8 (eight) hours. 04/23/17 05/23/17  Milinda Pointer, MD  PROLIA 60 MG/ML SOLN injection Inject into the skin every 6 (six) months.  11/12/16   [provider]    Family History Family History  Problem Relation Age of Onset  . Heart failure Mother   . Heart attack Father     Social History Social History   Tobacco Use  . Smoking status: Former Smoker    Packs/day: 1.50    Years: 30.00    Pack years: 45.00  . Smokeless tobacco: Never Used  . Tobacco comment: quit 2013  Substance Use Topics  . Alcohol use: No    Alcohol/week: 0.0 oz  . Drug use: No     Allergies   Ampicillin-sulbactam sodium; Ampicillin; Toradol [ketorolac tromethamine]; Tramadol; Flexeril [cyclobenzaprine]; Ambien [zolpidem tartrate]; Cephalexin; Ketorolac; Percocet [oxycodone-acetaminophen]; Sulfamethoxazole-trimethoprim; and Tape   Review of Systems Review of Systems  Constitutional: Negative for activity change, appetite change and fever.  HENT: Negative for dental problem, nosebleeds and trouble swallowing.   Eyes: Negative for pain and visual disturbance.  Respiratory: Negative for shortness of breath.   Cardiovascular: Negative for chest pain.  Gastrointestinal: Negative for abdominal pain, nausea and vomiting.  Genitourinary: Negative for dysuria and hematuria.  Musculoskeletal: Positive for  arthralgias, back pain and neck pain. Negative for joint swelling.  Skin: Negative for wound.  Neurological: Positive for headaches. Negative for weakness and numbness.  Psychiatric/Behavioral: Negative for confusion.     Physical Exam Updated Vital Signs BP (!) 143/100   Pulse (!) 118   Temp 98.3 F (36.8 C) (Oral)   Resp Marland Kitchen)  21   SpO2 90%   Physical Exam  Constitutional: She is oriented to person, place, and time. She appears well-developed and well-nourished.  HENT:  Head: Normocephalic and atraumatic.  Nose: Nose normal.  Eyes: Conjunctivae are normal. Pupils are equal, round, and reactive to light.  Neck:  Patient has a towel roll around her neck.  She has pain to the mid cervical spine.  She has no pain to the thoracic spine.  She has pain throughout the lumbosacral spine.  No step-offs or deformities noted  Cardiovascular: Regular rhythm. Tachycardia present.  No murmur heard. Pulmonary/Chest: Effort normal and breath sounds normal. No respiratory distress. She has no wheezes. She exhibits no tenderness.  Abdominal: Soft. Bowel sounds are normal. She exhibits no distension. There is no tenderness.  Musculoskeletal: Normal range of motion.  Patient has pain on range of motion of the right hip.  There is no pain to the knee or ankle.  Pedal pulses are intact.  There is no other pain on palpation or range of motion of the extremities  Neurological: She is alert and oriented to person, place, and time.  Skin: Skin is warm and dry. Capillary refill takes less than 2 seconds.  Psychiatric: She has a normal mood and affect.  Vitals reviewed.    ED Treatments / Results  Labs (all labs ordered are listed, but only abnormal results are displayed) Labs Reviewed - No data to display  EKG  EKG Interpretation  Date/Time:  Wednesday June 25 2017 11:03:53 EST Ventricular Rate:  116 PR Interval:    QRS Duration: 84 QT Interval:  330 QTC Calculation: 459 R Axis:   10 Text  Interpretation:  Sinus tachycardia Borderline T abnormalities, diffuse leads since last tracing no significant change Confirmed by Malvin Johns [phone]) on 06/25/2017 11:16:29 AM       Radiology Dg Lumbar Spine 2-3 Views  Result Date: 06/25/2017 CLINICAL DATA:  77 year old female with a history of fall EXAM: LUMBAR SPINE - 2-3 VIEW COMPARISON:  CT 10/23/2016 FINDINGS: Lumbar Spine: Lumbar vertebral elements maintain normal alignment without evidence of anterolisthesis, retrolisthesis, subluxation. No fracture line identified. Vertebral body heights maintained. No significant disc space narrowing. Mild endplate changes throughout the lumbar spine. Osteopenia. Vascular calcifications IMPRESSION: No radiographic evidence of acute fracture or malalignment of the lumbar spine. Osteopenia. Mild degenerative changes. Electronically Signed   By: Corrie Mckusick D.O.   On: 06/25/2017 10:32   Ct Head Wo Contrast  Result Date: 06/25/2017 CLINICAL DATA:  Fall at home [place] being on right side. Head injury. C-spine trauma with high clinical risk. EXAM: CT HEAD WITHOUT CONTRAST CT CERVICAL SPINE WITHOUT CONTRAST TECHNIQUE: Multidetector CT imaging of the head and cervical spine was performed following the standard protocol without intravenous contrast. Multiplanar CT image reconstructions of the cervical spine were also generated. COMPARISON:  09/13/2016 FINDINGS: CT HEAD FINDINGS Brain: No evidence of acute infarction, hemorrhage, hydrocephalus, extra-axial collection or mass lesion/mass effect. Mild chronic small vessel ischemia in the periventricular white matter. Mild for age cerebral volume loss. Vascular: Atherosclerotic calcification.  No hyperdense vessel. Skull: Negative for fracture Sinuses/Orbits: No evidence of injury. Right cataract resection and bilateral staphyloma. CT CERVICAL SPINE FINDINGS Alignment: No traumatic malalignment. Skull base and vertebrae: Remote compression fractures of C7 and T2. No acute  fracture. Soft tissues and spinal canal: No prevertebral fluid or swelling. No visible canal hematoma. Disc levels:  Usual degenerative changes. Upper chest: No evidence of injury. IMPRESSION: No evidence of acute intracranial or  cervical spine injury. Electronically Signed   By: Monte Fantasia M.D.   On: 06/25/2017 10:19   Ct Cervical Spine Wo Contrast  Result Date: 06/25/2017 CLINICAL DATA:  Fall at home [place] being on right side. Head injury. C-spine trauma with high clinical risk. EXAM: CT HEAD WITHOUT CONTRAST CT CERVICAL SPINE WITHOUT CONTRAST TECHNIQUE: Multidetector CT imaging of the head and cervical spine was performed following the standard protocol without intravenous contrast. Multiplanar CT image reconstructions of the cervical spine were also generated. COMPARISON:  09/13/2016 FINDINGS: CT HEAD FINDINGS Brain: No evidence of acute infarction, hemorrhage, hydrocephalus, extra-axial collection or mass lesion/mass effect. Mild chronic small vessel ischemia in the periventricular white matter. Mild for age cerebral volume loss. Vascular: Atherosclerotic calcification.  No hyperdense vessel. Skull: Negative for fracture Sinuses/Orbits: No evidence of injury. Right cataract resection and bilateral staphyloma. CT CERVICAL SPINE FINDINGS Alignment: No traumatic malalignment. Skull base and vertebrae: Remote compression fractures of C7 and T2. No acute fracture. Soft tissues and spinal canal: No prevertebral fluid or swelling. No visible canal hematoma. Disc levels:  Usual degenerative changes. Upper chest: No evidence of injury. IMPRESSION: No evidence of acute intracranial or cervical spine injury. Electronically Signed   By: Monte Fantasia M.D.   On: 06/25/2017 10:19   Dg Hip Unilat W Or Wo Pelvis 2-3 Views Right  Result Date: 06/25/2017 CLINICAL DATA:  Right hip pain since a fall today. Initial encounter. EXAM: DG HIP (WITH OR WITHOUT PELVIS) 2-3V RIGHT COMPARISON:  Plain films right hip  11/21/2016. FINDINGS: No acute bony or joint abnormality is identified. Healed right intertrochanteric fracture with fixation hardware in place is unchanged in appearance. Mild degenerative disease is present about the hips. IMPRESSION: No acute abnormality. Electronically Signed   By: Inge Rise M.D.   On: 06/25/2017 10:36    Procedures Procedures (including critical care time)  Medications Ordered in ED Medications  morphine 4 MG/ML injection 4 mg (4 mg Intravenous Given 06/25/17 0930)  morphine 4 MG/ML injection 4 mg (4 mg Intravenous Given 06/25/17 1113)  LORazepam (ATIVAN) injection 1 mg (1 mg Intravenous Given 06/25/17 1141)     Initial Impression / Assessment and Plan / ED Course  I have reviewed the triage vital signs and the nursing notes.  Pertinent labs & imaging results that were available during my care of the patient were reviewed by me and considered in my medical decision making (see chart for details).     Patient is a 77 year old female who presents after a fall.  It sounds like a mechanical fall.  She denies any other symptoms prior to the fall.  She had a CT scan of her head and neck which were negative for acute abnormalities.  She had pain primarily in her low back and right hip.  X-rays do not reveal any acute abnormalities.  Her pain is mostly in the right lower back and posterior hip.  She is neurologically intact.  She is able to put weight on the hip.  I have a low suspicion for occult fracture, she does have hardware that appears well seated.  She remains mildly tachycardic.  I had some concern that there might be some anxiety versus substance use issues.  Patient continued to repeatedly asked for pain medicine.  She been given fentanyl by EMS and got 2 doses of morphine in the ED.  I did give her a dose of Ativan.  Her heart rate has improved to the low 100s.  However patient and  her daughter were very unhappy.  I did offer her to do a CT scan to further assess  the area.  This point patient is refusing this and her daughter took her away.  They claim that they had never seen a physician since they been here.  I advised him that I was a physician and I have evaluated her several times.  She left prior to discharge instructions.  Final Clinical Impressions(s) / ED Diagnoses   Final diagnoses:  Fall, initial encounter  Right hip pain    ED Discharge Orders    None       Malvin Johns, MD 06/25/17 1235

## 2017-06-25 NOTE — ED Notes (Signed)
PT continues to call out for headache, back pain. She is continuously calling out despite IV pain meds.

## 2017-06-25 NOTE — ED Notes (Signed)
Pt called out stating she is having centralized chest pain. EKG performed and given to MD and notified.

## 2017-06-25 NOTE — ED Triage Notes (Signed)
PT states she tried to use bathroom wall to lean while getting out of bed. Landed on floor. R hip pain. 100 mcg fentanyl given in route. Did hit head; no LOC.

## 2017-06-25 NOTE — ED Notes (Addendum)
PT returned from imaging; reports having pain and requesting more meds; discussed with MD.

## 2017-06-25 NOTE — ED Notes (Addendum)
PT moved to hallway: EDP at bedside to update. Daughter with her agitated since patient still in pain. Patient and daughter promptly stated they wanted to leave and daughter wheeled her out.

## 2017-06-25 NOTE — ED Notes (Addendum)
PT complaining IV hurting when flushing; Holly, RN also check and IV is in vein and flushes well. Absolutely no evidence of infiltration.  RN removed.

## 2017-06-25 NOTE — ED Notes (Signed)
RN called [name] to come get patient; no answer.

## 2017-06-25 NOTE — ED Notes (Signed)
PT requesting pain meds before imaging; discussed with Dr. Tamera Punt. Orders received.

## 2017-06-26 ENCOUNTER — Ambulatory Visit: Payer: Medicare Other

## 2017-06-27 ENCOUNTER — Emergency Department: Payer: Medicare Other

## 2017-06-27 ENCOUNTER — Encounter: Payer: Self-pay | Admitting: *Deleted

## 2017-06-27 ENCOUNTER — Emergency Department
Admission: EM | Admit: 2017-06-27 | Discharge: 2017-06-27 | Disposition: A | Discharge status: Home / Self Care | Payer: Medicare Other | Attending: Emergency Medicine | Admitting: Emergency Medicine

## 2017-06-27 ENCOUNTER — Other Ambulatory Visit: Payer: Self-pay

## 2017-06-27 DIAGNOSIS — M25551 Pain in right hip: Secondary | ICD-10-CM | POA: Diagnosis not present

## 2017-06-27 DIAGNOSIS — W010XXA Fall on same level from slipping, tripping and stumbling without subsequent striking against object, initial encounter: Secondary | ICD-10-CM | POA: Diagnosis not present

## 2017-06-27 DIAGNOSIS — I129 Hypertensive chronic kidney disease with stage 1 through stage 4 chronic kidney disease, or unspecified chronic kidney disease: Secondary | ICD-10-CM | POA: Diagnosis not present

## 2017-06-27 DIAGNOSIS — Z885 Allergy status to narcotic agent status: Secondary | ICD-10-CM | POA: Insufficient documentation

## 2017-06-27 DIAGNOSIS — N183 Chronic kidney disease, stage 3 (moderate): Secondary | ICD-10-CM | POA: Insufficient documentation

## 2017-06-27 DIAGNOSIS — J449 Chronic obstructive pulmonary disease, unspecified: Secondary | ICD-10-CM | POA: Diagnosis not present

## 2017-06-27 DIAGNOSIS — Z880 Allergy status to penicillin: Secondary | ICD-10-CM | POA: Insufficient documentation

## 2017-06-27 DIAGNOSIS — Z79899 Other long term (current) drug therapy: Secondary | ICD-10-CM | POA: Insufficient documentation

## 2017-06-27 DIAGNOSIS — Z8673 Personal history of transient ischemic attack (TIA), and cerebral infarction without residual deficits: Secondary | ICD-10-CM | POA: Diagnosis not present

## 2017-06-27 DIAGNOSIS — Z87891 Personal history of nicotine dependence: Secondary | ICD-10-CM | POA: Diagnosis not present

## 2017-06-27 DIAGNOSIS — W19XXXA Unspecified fall, initial encounter: Secondary | ICD-10-CM

## 2017-06-27 MED ORDER — OXYCODONE HCL 5 MG PO TABS
5.0000 mg | ORAL_TABLET | Freq: Once | ORAL | Status: AC
  Administered 2017-06-27: 5 mg via ORAL
  Filled 2017-06-27: qty 1

## 2017-06-27 MED ORDER — ONDANSETRON HCL 4 MG/2ML IJ SOLN
INTRAMUSCULAR | Status: AC
  Administered 2017-06-27: 4 mg via INTRAVENOUS
  Filled 2017-06-27: qty 2

## 2017-06-27 MED ORDER — FENTANYL CITRATE (PF) 100 MCG/2ML IJ SOLN
100.0000 ug | Freq: Once | INTRAMUSCULAR | Status: AC
Start: 2017-06-27 — End: 2017-06-27
  Administered 2017-06-27: 100 ug via INTRAVENOUS
  Filled 2017-06-27: qty 2

## 2017-06-27 MED ORDER — ACETAMINOPHEN 500 MG PO TABS
1000.0000 mg | ORAL_TABLET | Freq: Once | ORAL | Status: AC
  Administered 2017-06-27: 1000 mg via ORAL
  Filled 2017-06-27: qty 2

## 2017-06-27 MED ORDER — ONDANSETRON HCL 4 MG/2ML IJ SOLN
4.0000 mg | Freq: Once | INTRAMUSCULAR | Status: AC
  Administered 2017-06-27: 4 mg via INTRAVENOUS

## 2017-06-27 MED ORDER — OXYCODONE HCL 5 MG PO TABS: 5.0000 mg | ORAL_TABLET | Freq: Three times a day (TID) | ORAL | 0 refills | Status: DC | PRN

## 2017-06-27 NOTE — ED Provider Notes (Signed)
[facility] Emergency Department Provider Note  ____________________________________________  Time seen: Approximately 5:47 PM  I have reviewed the triage vital signs and the nursing notes.   HISTORY  Chief Complaint Fall   HPI [name] is a 77 y.o. female with h/o R hip fracture s/p intertrochanteric nail placement on 08/2016, arthritiswho presents for evaluation of R hip pain. Patient sustained a mechanical fall yesterday at home where she lives with her daughter. Fell onto the R hip. Seen at [facility] with negative XR of hip. Pain has gotten severe and patient is able to bear wait to transfer to and from the commode but unable to ambulate due to severe pain. Pain is constant worse with movement, sharp, severe. She has been taking gabapentin, Vicodin, and meloxicam at home with no relief. She denies any other pain at this time. She did undergo CT head and cspine yesterday at [facility] as well.  Past Medical History:  Diagnosis Date  . Anemia   . B12 deficiency   . Bacteremia 10/07/2014  . Bladder incontinence   . Blind left eye   . Cataract   . Cervical spine fracture ([place])   . Chest pain 07/29/2013  . Compression fracture    C7,  upper T spine -compression fx  . COPD (chronic obstructive pulmonary disease) ([place])   . DDD (degenerative disc disease), lumbar   . Depression    major  . Diffuse myofascial pain syndrome 03/24/2015  . Fever 10/05/2014  . GERD (gastroesophageal reflux disease)   . Hiatal hernia   . Hyperlipidemia   . Hypertension   . 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 Macular degeneration   . Osteoarthritis   . Osteoporosis   . Reactive airway disease   . Rupture of bowel ([place])   . Sepsis ([place]) 10/08/2014  . Stroke [facility])    "mini stroke"  . Stroke ([place])   . Wrist pain, acute 09/10/2012    Patient Active Problem List   Diagnosis Date Noted  . CKD (chronic kidney disease) stage 3, GFR 30-59 ml/min (HCC) 04/23/2017    . Chronic knee pain s/p total knee replacement (TKR) (Right) 04/02/2017  . DDD (degenerative disc disease), lumbar 03/05/2017  . Chronic knee pain (Bilateral) (R>L) 03/05/2017  . Osteoarthritis 03/05/2017  . Chronic musculoskeletal pain 03/05/2017  . Neurogenic pain 03/05/2017  . Chronic myofascial pain 02/12/2017  . Lumbar facet syndrome (Bilateral) (L>R) 02/12/2017  . Long term prescription benzodiazepine use 02/12/2017  . Opiate use 02/12/2017  . Disorder of skeletal system 02/12/2017  . Pharmacologic therapy 02/12/2017  . Problems influencing health status 02/12/2017  . 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 NSAID long-term use 02/12/2017  . DDD (degenerative disc disease), cervical 11/11/2016  . Lumbar facet arthropathy 11/11/2016  . 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 CAFL (chronic airflow limitation) ([place]) 11/07/2016  . Depression, major, in partial remission ([place]) 11/07/2016  . 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 Bed wetting 11/07/2016  . Long term current use of opiate analgesic 11/07/2016  . Long term prescription opiate use 11/07/2016  . Chronic pain syndrome 11/07/2016  . Chronic neck pain (Secondary Area of Pain) (Bilateral) (L>R) 11/07/2016  . Chronic hip pain (Right) 11/07/2016  . Rib pain 11/07/2016  . Age-related osteoporosis with current pathological fracture with routine healing 09/20/2016  . History of stroke 09/20/2016  . Major depression in remission ([place]) 09/20/2016  . Hip fracture ([place]) 09/13/2016  . Displaced intertrochanteric  fracture of right femur, initial encounter for closed fracture ([place]) 09/13/2016  . Fall   . Chronic shoulder pain (Bilateral) 08/12/2016  . Hx of total knee replacement (Bilateral) 04/04/2016  . Hoarseness 01/12/2016  . Pharyngoesophageal dysphagia 01/12/2016  . Chronic low back pain [facility]) (Bilateral) (L>R) 10/26/2015  . S/P revision of total  replacement of knee (Right) 10/26/2015  . 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 UTI (urinary tract infection) 06/25/2015  . Syncope, non cardiac 05/22/2015  . Leukopenia 10/07/2014  . Thrombocytopenia ([place]) 10/07/2014  . Hypoxia 10/05/2014  . Anxiety 06/15/2014  . Apoplectic vertigo 06/15/2014  . Duodenogastric reflux 06/15/2014  . Benign essential HTN 06/15/2014  . Infection of the upper respiratory tract 06/15/2014  . Hypercholesteremia 07/30/2013  . HTN (hypertension) 07/30/2013  . GERD (gastroesophageal reflux disease) 07/30/2013  . Dizziness and giddiness 06/18/2013  . Memory loss 05/03/2013  . Chronic knee pain (Primary Area of Pain) (Right) 09/10/2012    Past Surgical History:  Procedure Laterality Date  . ABDOMINAL HYSTERECTOMY    . ABDOMINAL SURGERY    . APPENDECTOMY    . BLADDER SURGERY    . CHOLECYSTECTOMY    . COLON SURGERY    . EYE SURGERY     cataracts  . HIP SURGERY Right 08/2016  . INTRAMEDULLARY (IM) NAIL INTERTROCHANTERIC Right 09/13/2016   Procedure: INTRAMEDULLARY (IM) NAIL INTERTROCHANTRIC RIGHT;  Surgeon: Leandrew Koyanagi, MD;  Location: [facility];  Service: Orthopedics;  Laterality: Right;  . JOINT REPLACEMENT    . KNEE SURGERY    . OSTOMY    . RECTOCELE REPAIR      Prior to Admission medications   Medication Sig Start Date End Date Taking? Authorizing Provider  albuterol (PROVENTIL HFA;VENTOLIN HFA) 108 (90 BASE) MCG/ACT inhaler Inhale 1 puff into the lungs every 6 (six) hours as needed for wheezing or shortness of breath.   Yes [provider]  albuterol (PROVENTIL) (2.5 MG/3ML) 0.083% nebulizer solution Take 2.5 mg by nebulization every 6 (six) hours as needed for wheezing or shortness of breath.   Yes [provider]  ALPRAZolam Duanne Moron) 1 MG tablet Take 1 mg by mouth at bedtime as needed.  02/03/17  Yes [provider]  amLODipine (NORVASC) 5 MG tablet Take 5 mg by mouth daily.  04/06/13  Yes [provider]    baclofen (LIORESAL) 10 MG tablet Take 1 tablet (10 mg total) by mouth daily. 03/05/17 06/27/17 Yes Milinda Pointer, MD  Calcium-Vitamin D-Vitamin K (CALCIUM SOFT CHEWS PO) Take by mouth daily at 6 (six) AM.   Yes [provider]  citalopram (CELEXA) 20 MG tablet Take 20 mg by mouth daily.  02/27/17  Yes [provider]  diclofenac sodium (VOLTAREN) 1 % GEL Apply 2 g topically 4 (four) times daily. 03/05/17 06/27/17 Yes Milinda Pointer, MD  diphenhydrAMINE (BENADRYL) 25 MG tablet Take 25 mg by mouth as needed.   Yes [provider]  docusate sodium (COLACE) 100 MG capsule Take 100 mg by mouth as needed for mild constipation.   Yes [provider]  esomeprazole (NEXIUM) 40 MG capsule Take 40 mg by mouth 2 (two) times daily before a meal.    Yes [provider]  Fexofenadine HCl (MUCINEX ALLERGY PO) Take by mouth as needed.   Yes [provider]  gabapentin (NEURONTIN) 400 MG capsule Take 1 capsule (400 mg total) by mouth 4 (four) times daily. 03/05/17 06/27/17 Yes Milinda Pointer, MD  [place]  Take by mouth daily at 6 (six) AM.   Yes [provider]  lovastatin (MEVACOR) 10 MG tablet Take 10 mg by mouth daily. 09/22/14  Yes [provider]  meloxicam (MOBIC) 15 MG tablet Take 15 mg by mouth daily.  05/23/16  Yes [provider]  omega-3 fish oil (MAXEPA) 1000 MG CAPS capsule Take by mouth daily at 6 (six) AM.   Yes [provider]  ondansetron (ZOFRAN) 4 MG tablet Take 4 mg by mouth every 8 (eight) hours as needed for nausea.  02/29/16  Yes [provider]  polyethylene glycol powder (GLYCOLAX/MIRALAX) powder Take by mouth as directed.  11/28/16  Yes [provider]  PROLIA 60 MG/ML SOLN injection Inject into the skin every 6 (six) months.  11/12/16  Yes [provider]  promethazine (PHENERGAN) 12.5 MG suppository Place 12.5 mg rectally as needed for nausea or vomiting.   Yes  [provider]  senna (SENOKOT) 8.6 MG TABS tablet Take 2 tablets by mouth. constipation   Yes [provider]  traZODone (DESYREL) 100 MG tablet Take 50 mg by mouth at bedtime.    Yes [provider]  HYDROcodone-acetaminophen (NORCO/VICODIN) 5-325 MG tablet Take 1 tablet by mouth every 8 (eight) hours. Patient not taking: Reported on 06/27/2017 04/23/17 06/27/17  Milinda Pointer, MD  orphenadrine (NORFLEX) 100 MG tablet Take 1 tablet (100 mg total) by mouth 2 (two) times daily. Patient not taking: Reported on 06/27/2017 04/24/17   Vevelyn Francois, NP  oxyCODONE (ROXICODONE) 5 MG immediate release tablet Take 1 tablet (5 mg total) by mouth every 8 (eight) hours as needed. 06/27/17 06/27/18  Rudene Re, MD    Allergies Ampicillin-sulbactam sodium; Ampicillin; Toradol [ketorolac tromethamine]; Tramadol; Flexeril [cyclobenzaprine]; Ambien [zolpidem tartrate]; Cephalexin; Ketorolac; Percocet [oxycodone-acetaminophen]; Sulfamethoxazole-trimethoprim; and Tape  Family History  Problem Relation Age of Onset  . Heart failure Mother   . Heart attack Father     Social History Social History   Tobacco Use  . Smoking status: Former Smoker    Packs/day: 1.50    Years: 30.00    Pack years: 45.00  . Smokeless tobacco: Never Used  . Tobacco comment: quit 2013  Substance Use Topics  . Alcohol use: No    Alcohol/week: 0.0 oz  . Drug use: No    Review of Systems  Constitutional: Negative for fever. Eyes: Negative for visual changes. ENT: Negative for sore throat. Neck: No neck pain  Cardiovascular: Negative for chest pain. Respiratory: Negative for shortness of breath. Gastrointestinal: Negative for abdominal pain, vomiting or diarrhea. Genitourinary: Negative for dysuria. Musculoskeletal: Negative for back pain. + R hip pain Skin: Negative for rash. Neurological: Negative for headaches, weakness or numbness. Psych: No SI or  HI  ____________________________________________   PHYSICAL EXAM:  VITAL SIGNS: ED Triage Vitals  Enc Vitals Group     BP 06/27/17 1421 (!) 146/83     Pulse Rate 06/27/17 1421 94     Resp 06/27/17 1421 18     Temp 06/27/17 1421 99.3 F (37.4 C)     Temp Source 06/27/17 1421 Oral     SpO2 06/27/17 1421 93 %     Weight 06/27/17 1422 185 lb (83.9 kg)     Height 06/27/17 1422 5\' 5"  (1.651 m)     Head Circumference --      Peak Flow --      Pain Score 06/27/17 1422 10     Pain Loc --  Pain Edu? --      Excl. in [place]? --     Constitutional: Alert and oriented. Well appearing and in no apparent distress. HEENT:      Head: Normocephalic and atraumatic.         Eyes: Conjunctivae are normal. Sclera is non-icteric.       Mouth/Throat: Mucous membranes are moist.       Neck: Supple with no signs of meningismus. Cardiovascular: Regular rate and rhythm. No murmurs, gallops, or rubs. 2+ symmetrical distal pulses are present in all extremities. No JVD. Respiratory: Normal respiratory effort. Lungs are clear to auscultation bilaterally. No wheezes, crackles, or rhonchi.  Gastrointestinal: Soft, non tender, and non distended with positive bowel sounds. No rebound or guarding. Musculoskeletal: Decreased ROM due to significant pain, small bruise over the lateral proximal femur area, ttp over the hip joint. No c/t/l spine ttp Neurologic: Normal speech and language. Face is symmetric. Moving all extremities. No gross focal neurologic deficits are appreciated. Skin: Skin is warm, dry and intact. No rash noted. Psychiatric: Mood and affect are normal. Speech and behavior are normal.  ____________________________________________   LABS (all labs ordered are listed, but only abnormal results are displayed)  Labs Reviewed - No data to display ____________________________________________  EKG  none  ____________________________________________  RADIOLOGY  Interpreted by me: CT hip:  chronic fracture with hardware in place.   Interpretation by Radiologist:  Ct Hip Right Wo Contrast  Result Date: 06/27/2017 CLINICAL DATA:  Status fall 2 days ago.  Right hip pain. EXAM: CT OF THE RIGHT HIP WITHOUT CONTRAST TECHNIQUE: Multidetector CT imaging of the right hip was performed according to the standard protocol. Multiplanar CT image reconstructions were also generated. COMPARISON:  None. FINDINGS: Bones/Joint/Cartilage Chronic ununited intertrochanteric fracture with sclerotic margins on either side of the fracture cleft transfixed with an intramedullary nail and interlocking femoral neck screw. No acute fracture or dislocation. Normal alignment. No joint effusion. Severe axial right hip joint space narrowing with a bone-on-bone appearance and subchondral reactive marrow changes with small inferior humeral marginal osteophytes consistent with advanced osteoarthritis. Ligaments Ligaments are suboptimally evaluated by CT. Muscles and Tendons Muscles are normal.  No muscle atrophy. Soft tissue No fluid collection or hematoma.  No soft tissue mass. IMPRESSION: 1. 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 2.  No acute osseous injury of the right hip. 3. Severe osteoarthritis of the right hip. Electronically Signed   By: Kathreen Devoid   On: 06/27/2017 15:22     ____________________________________________   PROCEDURES  Procedure(s) performed: None Procedures Critical Care performed:  None ____________________________________________   INITIAL IMPRESSION / ASSESSMENT AND PLAN / ED COURSE  77 y.o. female with h/o R hip fracture s/p intertrochanteric nail placement on 08/2016, arthritiswho presents for evaluation of R hip pain after a fall yesterday. Patient has severe pain however is able to bear weight. CT scan here shows chronic unhealed  intertrochanteric fracture with no acute findings. Hardware is in place. Will address pian and attempt to ambulate. Patient has walker at home.   Clinical Course as of Jun 29 47  Fri Jun 27, 2017  2221 Patient's pain is markedly improved. She is ambulatory with a walker with no difficulty. She is going to be discharged home on Tylenol 1000 mg every 8 hours and oxycodone 5 mg every 4-6 hours when necessary. Discussed return precautions. Recommend the patient use  her walker at home and 2 she feels back to her baseline. Discussed close follow-up with primary care doctor.  [CV]    Clinical Course User Index [CV] Alfred Levins [place], MD     As part of my medical decision making, I reviewed the following data within the [place] History obtained from family, Nursing notes reviewed and incorporated, Radiograph reviewed , Notes from prior ED visits and [place] Controlled Substance Database    Pertinent labs & imaging results that were available during my care of the patient were reviewed by me and considered in my medical decision making (see chart for details).    ____________________________________________   FINAL CLINICAL IMPRESSION(S) / ED DIAGNOSES  Final diagnoses:  Fall, initial encounter  Right hip pain      NEW MEDICATIONS STARTED DURING THIS VISIT:  ED Discharge Orders        Ordered    oxyCODONE (ROXICODONE) 5 MG immediate release tablet  Every 8 hours PRN     06/27/17 2219       Note:  This document was prepared using Dragon voice recognition software and may include unintentional dictation errors.    Alfred Levins, [place], MD 06/28/17 [phone]

## 2017-06-27 NOTE — ED Triage Notes (Addendum)
Per patient and daughter's report, patient fell in the bathroom yesterday and was seen at [facility] yesterday for c/o right hip pain. Patient had x-rays and Ct scan of neck. Patient and daughter states that today the patient can't transfer from lying to sitting up or walk today. [facility] brought the patient here today. Patient has a history of right hip repair and C7 fracture.

## 2017-06-27 NOTE — ED Notes (Signed)
Per EMS fall 2 days ago, no LOC, seen at [facility] post fall, Xray (-) still c/o right hip pain, appears in no distress.

## 2017-06-27 NOTE — Discharge Instructions (Signed)
Pain control: Take tylenol 1000mg every 8 hours. Take 5mg of oxycodone every 6 hours for breakthrough pain. If you need the oxycodone make sure to take one senokot as well to prevent constipation.  Do not drink alcohol, drive or participate in any other potentially dangerous activities while taking this medication as it may make you sleepy. Do not take this medication with any other sedating medications, either prescription or over-the-counter.  

## 2017-06-30 ENCOUNTER — Encounter: Payer: Medicare Other | Admitting: Physical Therapy

## 2017-06-30 ENCOUNTER — Other Ambulatory Visit: Payer: Self-pay | Admitting: Orthopedic Surgery

## 2017-07-02 ENCOUNTER — Ambulatory Visit: Payer: Medicare Other | Attending: Pain Medicine | Admitting: Pain Medicine

## 2017-07-02 ENCOUNTER — Encounter: Payer: Self-pay | Admitting: Pain Medicine

## 2017-07-02 ENCOUNTER — Other Ambulatory Visit: Payer: Self-pay

## 2017-07-02 VITALS — BP 140/79 | HR 90 | Temp 98.0°F | Resp 16 | Ht 65.0 in | Wt 186.0 lb

## 2017-07-02 DIAGNOSIS — Z96659 Presence of unspecified artificial knee joint: Secondary | ICD-10-CM | POA: Insufficient documentation

## 2017-07-02 DIAGNOSIS — M25551 Pain in right hip: Secondary | ICD-10-CM | POA: Diagnosis not present

## 2017-07-02 DIAGNOSIS — X58XXXS Exposure to other specified factors, sequela: Secondary | ICD-10-CM | POA: Insufficient documentation

## 2017-07-02 DIAGNOSIS — M48061 Spinal stenosis, lumbar region without neurogenic claudication: Secondary | ICD-10-CM | POA: Diagnosis not present

## 2017-07-02 DIAGNOSIS — M4854XS Collapsed vertebra, not elsewhere classified, thoracic region, sequela of fracture: Secondary | ICD-10-CM | POA: Insufficient documentation

## 2017-07-02 DIAGNOSIS — Z880 Allergy status to penicillin: Secondary | ICD-10-CM | POA: Insufficient documentation

## 2017-07-02 DIAGNOSIS — Z9181 History of falling: Secondary | ICD-10-CM | POA: Diagnosis not present

## 2017-07-02 DIAGNOSIS — Z8249 Family history of ischemic heart disease and other diseases of the circulatory system: Secondary | ICD-10-CM | POA: Insufficient documentation

## 2017-07-02 DIAGNOSIS — G894 Chronic pain syndrome: Secondary | ICD-10-CM | POA: Diagnosis not present

## 2017-07-02 DIAGNOSIS — E7800 Pure hypercholesterolemia, unspecified: Secondary | ICD-10-CM | POA: Insufficient documentation

## 2017-07-02 DIAGNOSIS — H5462 Unqualified visual loss, left eye, normal vision right eye: Secondary | ICD-10-CM | POA: Insufficient documentation

## 2017-07-02 DIAGNOSIS — N183 Chronic kidney disease, stage 3 (moderate): Secondary | ICD-10-CM | POA: Insufficient documentation

## 2017-07-02 DIAGNOSIS — I129 Hypertensive chronic kidney disease with stage 1 through stage 4 chronic kidney disease, or unspecified chronic kidney disease: Secondary | ICD-10-CM | POA: Insufficient documentation

## 2017-07-02 DIAGNOSIS — Z791 Long term (current) use of non-steroidal anti-inflammatories (NSAID): Secondary | ICD-10-CM | POA: Insufficient documentation

## 2017-07-02 DIAGNOSIS — M47816 Spondylosis without myelopathy or radiculopathy, lumbar region: Secondary | ICD-10-CM

## 2017-07-02 DIAGNOSIS — M4802 Spinal stenosis, cervical region: Secondary | ICD-10-CM | POA: Insufficient documentation

## 2017-07-02 DIAGNOSIS — Z87891 Personal history of nicotine dependence: Secondary | ICD-10-CM | POA: Insufficient documentation

## 2017-07-02 DIAGNOSIS — Z885 Allergy status to narcotic agent status: Secondary | ICD-10-CM | POA: Insufficient documentation

## 2017-07-02 DIAGNOSIS — M1611 Unilateral primary osteoarthritis, right hip: Secondary | ICD-10-CM | POA: Insufficient documentation

## 2017-07-02 DIAGNOSIS — Z8673 Personal history of transient ischemic attack (TIA), and cerebral infarction without residual deficits: Secondary | ICD-10-CM | POA: Insufficient documentation

## 2017-07-02 DIAGNOSIS — M4804 Spinal stenosis, thoracic region: Secondary | ICD-10-CM | POA: Diagnosis not present

## 2017-07-02 DIAGNOSIS — M170 Bilateral primary osteoarthritis of knee: Secondary | ICD-10-CM | POA: Diagnosis not present

## 2017-07-02 DIAGNOSIS — J449 Chronic obstructive pulmonary disease, unspecified: Secondary | ICD-10-CM | POA: Insufficient documentation

## 2017-07-02 DIAGNOSIS — M25561 Pain in right knee: Secondary | ICD-10-CM

## 2017-07-02 DIAGNOSIS — M7918 Myalgia, other site: Secondary | ICD-10-CM | POA: Diagnosis not present

## 2017-07-02 DIAGNOSIS — Z79891 Long term (current) use of opiate analgesic: Secondary | ICD-10-CM | POA: Insufficient documentation

## 2017-07-02 DIAGNOSIS — K219 Gastro-esophageal reflux disease without esophagitis: Secondary | ICD-10-CM | POA: Insufficient documentation

## 2017-07-02 DIAGNOSIS — R55 Syncope and collapse: Secondary | ICD-10-CM | POA: Insufficient documentation

## 2017-07-02 DIAGNOSIS — S72141S Displaced intertrochanteric fracture of right femur, sequela: Secondary | ICD-10-CM | POA: Diagnosis not present

## 2017-07-02 DIAGNOSIS — M545 Low back pain, unspecified: Secondary | ICD-10-CM

## 2017-07-02 DIAGNOSIS — M533 Sacrococcygeal disorders, not elsewhere classified: Secondary | ICD-10-CM | POA: Diagnosis not present

## 2017-07-02 DIAGNOSIS — Z90710 Acquired absence of both cervix and uterus: Secondary | ICD-10-CM | POA: Insufficient documentation

## 2017-07-02 DIAGNOSIS — G8929 Other chronic pain: Secondary | ICD-10-CM

## 2017-07-02 DIAGNOSIS — Z79899 Other long term (current) drug therapy: Secondary | ICD-10-CM

## 2017-07-02 DIAGNOSIS — Z888 Allergy status to other drugs, medicaments and biological substances status: Secondary | ICD-10-CM | POA: Insufficient documentation

## 2017-07-02 DIAGNOSIS — F329 Major depressive disorder, single episode, unspecified: Secondary | ICD-10-CM | POA: Diagnosis not present

## 2017-07-02 DIAGNOSIS — M5126 Other intervertebral disc displacement, lumbar region: Secondary | ICD-10-CM | POA: Insufficient documentation

## 2017-07-02 DIAGNOSIS — H3530 Unspecified macular degeneration: Secondary | ICD-10-CM | POA: Insufficient documentation

## 2017-07-02 DIAGNOSIS — Z881 Allergy status to other antibiotic agents status: Secondary | ICD-10-CM | POA: Insufficient documentation

## 2017-07-02 DIAGNOSIS — E538 Deficiency of other specified B group vitamins: Secondary | ICD-10-CM | POA: Insufficient documentation

## 2017-07-02 DIAGNOSIS — M5136 Other intervertebral disc degeneration, lumbar region: Secondary | ICD-10-CM | POA: Diagnosis not present

## 2017-07-02 DIAGNOSIS — Z9049 Acquired absence of other specified parts of digestive tract: Secondary | ICD-10-CM | POA: Insufficient documentation

## 2017-07-02 DIAGNOSIS — F419 Anxiety disorder, unspecified: Secondary | ICD-10-CM | POA: Diagnosis not present

## 2017-07-02 DIAGNOSIS — Z9889 Other specified postprocedural states: Secondary | ICD-10-CM | POA: Insufficient documentation

## 2017-07-02 DIAGNOSIS — A419 Sepsis, unspecified organism: Secondary | ICD-10-CM | POA: Insufficient documentation

## 2017-07-02 DIAGNOSIS — M542 Cervicalgia: Secondary | ICD-10-CM | POA: Diagnosis not present

## 2017-07-02 DIAGNOSIS — Z882 Allergy status to sulfonamides status: Secondary | ICD-10-CM | POA: Insufficient documentation

## 2017-07-02 DIAGNOSIS — D649 Anemia, unspecified: Secondary | ICD-10-CM | POA: Insufficient documentation

## 2017-07-02 DIAGNOSIS — R32 Unspecified urinary incontinence: Secondary | ICD-10-CM | POA: Insufficient documentation

## 2017-07-02 DIAGNOSIS — K449 Diaphragmatic hernia without obstruction or gangrene: Secondary | ICD-10-CM | POA: Insufficient documentation

## 2017-07-02 NOTE — Progress Notes (Signed)
Nursing Pain Medication Assessment:  Safety precautions to be maintained throughout the outpatient stay will include: orient to surroundings, keep bed in low position, maintain call bell within reach at all times, provide assistance with transfer out of bed and ambulation.  Medication Inspection Compliance: Pill count conducted under aseptic conditions, in front of the patient. Neither the pills nor the bottle was removed from the patient's sight at any time. Once count was completed pills were immediately returned to the patient in their original bottle.  Medication #1: Hydrocodone/APAP Pill/Patch Count: 44 of 90 pills remain Pill/Patch Appearance: Markings consistent with prescribed medication Bottle Appearance: Standard pharmacy container. Clearly labeled. Filled Date: 11/28 / 2018 Last Medication intake:  Today  Medication #2: Oxycodone IR Pill/Patch Count: 18 of 20 pills remain Pill/Patch Appearance: Markings consistent with prescribed medication Bottle Appearance: Standard pharmacy container. Clearly labeled. Filled Date:02/02 / 2019 Last Medication intake:  last dose 06/28/2017   Received Oxycodone in ED after falling.

## 2017-07-02 NOTE — Progress Notes (Addendum)
Patient's Name: [name]  MRN: [id number]  Referring Provider: Wenda Low, MD  DOB: April 07, 1941  PCP: Wenda Low, MD  DOS: 07/02/2017  Note by: Gaspar Cola, MD  Service setting: Ambulatory outpatient  Specialty: Interventional Pain Management  Location: [facility] (AMB) Pain Management Facility    Patient type: Established   Primary Reason(s) for Visit: Encounter for prescription drug management. (Level of risk: moderate)  CC: Back Pain (lower) and Neck Pain  HPI  [name] is a 77 y.o. year old, female patient, who comes today for a medication management evaluation. 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[place]); Hypertensive kidney disease, malignant; Chronic low back pain (Tertiary Area of Pain) (Bilateral) (L>R); CAFL (chronic airflow limitation) ([place]); Apoplectic vertigo; Duodenogastric reflux; History of stroke; Hoarseness; Benign essential HTN; Chronic knee pain (Primary Area of Pain) (Right); Depression, major, in partial remission ([place]); Major depression in remission ([place]); Involutional osteoporosis; Pharyngoesophageal dysphagia; 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); Hx of total knee replacement (Bilateral); Infection of the upper respiratory tract; Bed wetting; UTI (urinary tract infection); Long term current use of opiate analgesic; Long term prescription opiate use; Chronic pain syndrome; Chronic neck pain (Secondary Area of Pain) (Bilateral) (L>R); Chronic hip pain (Right); Rib pain; DDD (degenerative disc disease), cervical; Lumbar facet arthropathy (Bilateral);  Chronic myofascial pain; Lumbar facet syndrome (Bilateral) (L>R); Long term prescription benzodiazepine use; Opiate use; Disorder of skeletal system; Pharmacologic therapy; Problems influencing health status; Opioid-induced constipation (OIC); NSAID long-term use; DDD (degenerative disc disease), lumbar; Chronic knee pain (Bilateral) (R>L); Osteoarthritis; Chronic musculoskeletal pain; Neurogenic pain; Chronic knee pain s/p total knee replacement (TKR) (Right); CKD (chronic kidney disease) stage 3, GFR 30-59 ml/min ([place]); Chronic shoulder pain (Left); Non-traumatic compression fracture of T3 thoracic vertebra, sequela; High risk medication use; At high risk for falls; and Chronic sacroiliac joint pain (Right) on their problem list. Her primarily concern today is the Back Pain (lower) and Neck Pain  Pain Assessment: Location: Lower Back Radiating: right arm Onset: More than a month ago Duration: Chronic pain Quality: Dull Severity: 8 /10 (self-reported pain score)  Note: Reported level is inconsistent with clinical observations. Clinically the patient looks like a 4/10 A 4/10 is viewed as "Moderately Severe" and described as impossible to ignore for more than a few minutes. With effort, patients may still be able to manage work or participate in some social activities. Very difficult to concentrate. Signs of autonomic nervous system discharge are evident: dilated pupils (mydriasis); mild sweating (diaphoresis); sleep interference. Heart rate becomes elevated (>115 bpm). Diastolic blood pressure (lower number) rises above 100 mmHg. Patients find relief in laying down and not moving. [name] does not seem to understand the use of our objective pain scale When using our objective Pain Scale, levels between 6 and 10/10 are said to belong in an emergency room, as it progressively worsens from a 6/10, described as severely limiting, requiring emergency care not usually available at an outpatient pain management  facility. At a 6/10 level, communication becomes difficult and requires great effort. Assistance to reach the emergency department may be required. Facial flushing and profuse sweating along with potentially dangerous increases in heart rate and blood pressure will be evident. Timing: Intermittent Modifying factors:  rest  [name] was last scheduled for an appointment on 04/23/2017 for medication management. During today's appointment we reviewed [name]'s chronic pain status, as well as her outpatient medication regimen.  On 04/03/2017 the patient had a right-sided genicular nerve block which provided her with 100% relief of the pain for 3 days.  Initially I had the patient on oxycodone IR 5 mg 1 tablet p.o. every 8 hours but on 04/23/2017 the patient indicated that she could not tolerate the medication and that she had much better results with hydrocodone/APAP.  On 04/23/2017 the patient was switched then to the hydrocodone/APAP 5/325 1 tablet every 8 hours (15 MME/day).  At that time, the patient was scheduled to return for a right-sided genicular nerve RFA.  In the event that the radiofrequency would take time to be approved, she was also scheduled to return for a diagnostic bilateral lumbar facet block under fluoroscopic guidance and IV sedation.  Review of the PMP reveals that since our last visit, the patient has received codeine syrup and alprazolam from Eugenia Pancoast, ANP-BC.  In addition the patient has received oxycodone IR 5 mg from [place], MD.  Review of the chart indicates that the patient was admitted to the [facility] emergency department secondary to a fall on 06/27/2017.  Records also show that the patient is scheduled to undergo a left knee revision by Dr. Lorre Nick on 07/28/2017.  The patient returns to the clinic today still having pain in the right hip with evidence of a recent bruise over the right trochanteric area.  The area continues to be purple in color suggesting a recent  subcutaneous hematoma.  Along with the right hip pain, the patient is also experiencing significant low back pain and right shoulder pain with intermittent spasms in the area of the right shoulder and right thigh.  The patient was instructed to take Gatorade (1 glass with each meal), a multivitamin, and tonic water 1 glass at bedtime, to help with the spasms.  The patient was also recommended to use heat to speed up the absorption of the subcutaneous blood (hematoma).  The patient was cautioned about using a heating pad and falling asleep on it since it can cause burns.  At today's physical exam of her lower back demonstrated significant decreased range of motion in the ability to stand up straight with point tenderness over the right PSIS area.  Physical exam was positive for pain arising from the right SI joint and right lumbar facets.  The patient may also have a trigger point around her right PSIS.  We have scheduled the patient to return for treatment of those areas.  I will not be changing her medications at this point since I am very concerned about her medication intake.  Every time that she comes into the clinics she looks "stoned".  She also is combining benzodiazepines and opioids against our recommendations.  Her polypharmacy is probably contributing significantly to her increased risk of falls.  She comes into the clinic today on a wheelchair.  She apparently lives in a trailer and therefore her ability to use assistive devices is limited due to the limited space at home.  The patient  reports that she does not use drugs. Her body mass index is 30.95 kg/m.  Further details on both, my assessment(s), as well as the proposed treatment plan, please see below.  Controlled Substance Pharmacotherapy Assessment REMS (Risk Evaluation and Mitigation Strategy)  Analgesic: Oxycodone IR 5 mg (#20 pills)  from Dr. Rudene Re, on 06/28/17.  [facility] emergency department admission due to a  fall.) MME/day: 21.43 mg/day.  Landis Martins, RN  07/02/2017  5:30 PM  Signed Nursing Pain Medication Assessment:  Safety precautions to be maintained throughout the outpatient stay will include: orient to surroundings, keep bed in low position, maintain call bell within reach at all times, provide assistance with transfer out of bed and ambulation.  Medication Inspection Compliance: Pill count conducted under aseptic conditions, in front of the patient. Neither the pills nor the bottle was removed from the patient's sight at any time. Once count was completed pills were immediately returned to the patient in their original bottle.  Medication #1: Hydrocodone/APAP Pill/Patch Count: 44 of 90 pills remain Pill/Patch Appearance: Markings consistent with prescribed medication Bottle Appearance: Standard pharmacy container. Clearly labeled. Filled Date: 11/28 / 2018 Last Medication intake:  Today  Medication #2: Oxycodone IR Pill/Patch Count: 18 of 20 pills remain Pill/Patch Appearance: Markings consistent with prescribed medication Bottle Appearance: Standard pharmacy container. Clearly labeled. Filled Date:02/02 / 2019 Last Medication intake:  last dose 06/28/2017   Received Oxycodone in ED after falling.   Pharmacokinetics: Liberation and absorption (onset of action): WNL Distribution (time to peak effect): WNL Metabolism and excretion (duration of action): WNL         Pharmacodynamics: Desired effects: Analgesia: Ms. Dredge reports >50% benefit. Functional ability: Patient reports that medication allows her to accomplish basic ADLs Clinically meaningful improvement in function (CMIF): Sustained CMIF goals met Perceived effectiveness: Described as relatively effective, allowing for increase in activities of daily living (ADL) Undesirable effects: Side-effects or Adverse reactions: None reported Monitoring: [place] PMP: Online review of the past 40-monthperiod conducted. Compliant with  practice rules and regulations Last UDS on record: Summary  Date Value Ref Range Status  11/07/2016 FINAL  Final    Comment:    ==================================================================== TOXASSURE COMP DRUG ANALYSIS,UR ==================================================================== Test                             Result       Flag       Units Drug Present and Declared for Prescription Verification   Alprazolam                     95           EXPECTED   ng/mg creat   Alpha-hydroxyalprazolam        71           EXPECTED   ng/mg creat    Source of alprazolam is a scheduled prescription medication.    Alpha-hydroxyalprazolam is an expected metabolite of alprazolam.   Hydrocodone                    946          EXPECTED   ng/mg creat   Hydromorphone                  53           EXPECTED   ng/mg creat   Dihydrocodeine                 75           EXPECTED   ng/mg creat   Norhydrocodone                 1955  EXPECTED   ng/mg creat    Sources of hydrocodone include scheduled prescription    medications. Hydromorphone, dihydrocodeine and norhydrocodone are    expected metabolites of hydrocodone. Hydromorphone and    dihydrocodeine are also available as scheduled prescription    medications.   Oxycodone                      182          EXPECTED   ng/mg creat   Noroxycodone                   1913         EXPECTED   ng/mg creat    Sources of oxycodone include scheduled prescription medications.    Noroxycodone is an expected metabolite of oxycodone.   Gabapentin                     PRESENT      EXPECTED   Methocarbamol                  PRESENT      EXPECTED   Citalopram                     PRESENT      EXPECTED   Desmethylcitalopram            PRESENT      EXPECTED    Desmethylcitalopram is an expected metabolite of citalopram or    the enantiomeric form, escitalopram.   Trazodone                      PRESENT      EXPECTED   1,3 chlorophenyl piperazine    PRESENT       EXPECTED    1,3-chlorophenyl piperazine is an expected metabolite of    trazodone.   Acetaminophen                  PRESENT      EXPECTED Drug Present not Declared for Prescription Verification   Cyclobenzaprine                PRESENT      UNEXPECTED Drug Absent but Declared for Prescription Verification   Baclofen                       Not Detected UNEXPECTED   Lidocaine                      Not Detected UNEXPECTED    Lidocaine, as indicated in the declared medication list, is not    always detected even when used as directed. ==================================================================== Test                      Result    Flag   Units      Ref Range   Creatinine              112              mg/dL      >=20 ==================================================================== Declared Medications:  The flagging and interpretation on this report are based on the  following declared medications.  Unexpected results may arise from  inaccuracies in the declared medications.  **Note: The testing scope of this panel includes these medications:  Alprazolam  Baclofen  Citalopram (Celexa)  Gabapentin  Hydrocodone (Hydrocodone-Acetaminophen)  Methocarbamol  Oxycodone (Oxycodone Acetaminophen)  Trazodone  **Note: The testing scope of this panel does not include small to  moderate amounts of these reported medications:  Acetaminophen  Acetaminophen (Hydrocodone-Acetaminophen)  Acetaminophen (Oxycodone Acetaminophen)  Lidocaine  **Note: The testing scope of this panel does not include following  reported medications:  Albuterol  Amlodipine  Buspirone  Ciprofloxacin  Cyanocobalamin  Docusate  Enoxaparin  Iron  Lovastatin  Meloxicam  Methylprednisolone  Naloxegol (Movantik)  Omeprazole (Nexium)  Ondansetron  Polyethylene Glycol  Sennosides  Tamsulosin ==================================================================== For clinical consultation, please call [phone]. ====================================================================    UDS interpretation: Unexpected findings: Multiple opioids in combination with a benzodiazepine.  In addition the patient has multiple muscle relaxants and multiple antidepressants.  High risk for drug to drug interactions. Medication Assessment Form: Reviewed. Abnormalities discussed Treatment compliance: Non-compliant Risk Assessment Profile: Aberrant behavior: See prior evaluations. None observed or detected today Comorbid factors increasing risk of overdose: See prior notes. No additional risks detected today Risk of substance use disorder (SUD): High  ORT Scoring interpretation table:  Score <3 = Low Risk for SUD  Score between 4-7 = Moderate Risk for SUD  Score >8 = High Risk for Opioid Abuse   Risk Mitigation Strategies:  Patient Counseling: Covered Patient-Prescriber Agreement (PPA): Present and active  Notification to other healthcare providers: Done  Pharmacologic Plan: No change in therapy, at this time.             Laboratory Chemistry  Inflammation Markers (CRP: Acute Phase) (ESR: Chronic Phase) Lab Results  Component Value Date   CRP <0.8 11/19/2016   ESRSEDRATE 20 11/19/2016   LATICACIDVEN 0.98 10/07/2014                 Renal Function Markers Lab Results  Component Value Date   BUN 7 (L) 12/04/2016   CREATININE 1.09 (H) 12/04/2016   GFRAA 57 (L) 12/04/2016   GFRNONAA 50 (L) 12/04/2016                 Hepatic Function Markers Lab Results  Component Value Date   AST 19 12/04/2016   ALT 15 12/04/2016   ALBUMIN 4.0 12/04/2016   ALKPHOS 104 12/04/2016   AMYLASE 68 01/25/2007   LIPASE 19 04/15/2016   AMMONIA 66 12/04/2016                 Electrolytes Lab Results  Component Value Date   NA 145 (H) 12/04/2016   K 4.0 12/04/2016   CL 107 (H) 12/04/2016   CALCIUM 9.4 12/04/2016   MG 2.0 11/19/2016                 Neuropathy Markers Lab Results  Component Value Date    VITAMINB12 1,888 (H) 11/19/2016   HGBA1C 5.6 05/22/2015                 Bone Pathology Markers Lab Results  Component Value Date   25OHVITD1 37 11/19/2016   25OHVITD2 <1.0 11/19/2016   25OHVITD3 37 11/19/2016                 Coagulation Parameters Lab Results  Component Value Date   INR 1.00 09/13/2016   LABPROT 13.2 09/13/2016   APTT 34 01/04/2011   PLT 263 12/04/2016   DDIMER 1.71 (H) 10/23/2016                 Cardiovascular Markers Lab Results  Component Value Date   BNP 60.2 01/23/2016  TROPONINI <0.03 05/23/2015   HGB 14.7 12/04/2016   HCT 44.8 12/04/2016                 Note: Lab results reviewed.  Recent Diagnostic Imaging Review  Cervical Imaging: Cervical MR wo contrast:  Results for orders placed during the hospital encounter of 10/07/14  MR Cervical Spine Wo Contrast   Narrative CLINICAL DATA:  Known cervical spine fracture, new onset fever. Here for assessment of sepsis.  EXAM: MRI CERVICAL AND LUMBAR SPINE WITHOUT CONTRAST  TECHNIQUE: Multiplanar and multiecho pulse sequences of the cervical spine, to include the craniocervical junction and cervicothoracic junction, and thoracic spine, were obtained without intravenous contrast. Please note, motion degraded examination. Per technologist note, patient did not wish to receive intravenous contrast due to pain, examination is noncontrast.  COMPARISON:  CT of the cervical spine Oct 05, 2014  FINDINGS: MRI CERVICAL SPINE FINDINGS  Motion degraded examination.  Moderate C7 compression fracture, low T1 and bright T2 signal with 30-50% height loss. No retropulsed bony fragments. Remaining cervical vertebral bodies appear intact. Maintenance of cervical lordosis. Intervertebral discs demonstrate normal morphology, decreased T2 signal within all cervical disc most consistent with desiccation.  Low signal measuring up to 6 mm in AP dimension posterior to the odontoid process consistent with  pannus, can be seen with CPPD without erosions or edema. Cervical spinal cord appears normal morphology and signal characteristics though, motion degrades sensitivity for subtle cord signal abnormality. No syrinx. Included prevertebral and paraspinal soft tissues are normal.  Level by level evaluation:  C2-3: Moderate facet arthropathy. No disc bulge, canal stenosis or neural foraminal narrowing.  C3-4: Annular bulging, uncovertebral hypertrophy. Moderate LEFT greater than RIGHT facet arthropathy. Very mild canal stenosis. Mild LEFT neural foraminal narrowing.  C4-5: Small broad-based disc bulge, mild to moderate facet arthropathy and uncovertebral hypertrophy. Moderate canal stenosis. Moderate to severe LEFT greater than RIGHT neural foraminal narrowing.  C5-6: Small broad-based disc bulge, uncovertebral hypertrophy and moderate facet arthropathy. Mild canal stenosis. Moderate RIGHT, moderate to severe LEFT neural foraminal narrowing.  C6-7: Severely limited by motion and habitus. No definite disc bulge. At least moderate facet arthropathy without canal stenosis or neural foraminal narrowing.  C7-T1: No definite disc bulge, canal stenosis. At least mild facet arthropathy without neural foraminal narrowing.  MRI THORACIC SPINE FINDINGS  Moderate acute appearing T2 compression fracture with 30-50% height loss. Mild acute appearing T3 compression fracture. The remaining thoracic vertebral bodies appear intact. Maintenance of thoracic kyphosis. Intervertebral discs demonstrate relatively preserved morphology and signal characteristics. Minimal subacute to chronic discogenic endplate change of lower thoracic spine. 13 mm T6 hemangioma. Scattered chronic Schmorl's nodes.  Thoracic spinal cord appears normal in morphology and signal characteristics of the level the conus medullaris which terminates at T12-L1. Motion decreases sensitivity for potential cord  signal abnormality.  No significant disc bulge, canal stenosis or neural foraminal narrowing any thoracic level.  IMPRESSION: Please note, examination was ordered with contrast though, after noncontrast portion, patient was unable to tolerate imaging due to pain and contrast-enhanced sequences not performed.  MRI CERVICAL SPINE: Acute moderate C7 compression fracture. No malalignment.  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 Multilevel neural foraminal narrowing: Moderate to severe on the LEFT at C4-5 and C5-6.  MRI THORACIC SPINE: Acute moderate T2 compression fracture. Mild acute T3 compression fracture. No malalignment.   Electronically Signed   By: Elon Alas   On: 10/08/2014 04:30    Cervical  CT wo contrast:  Results for orders placed during the hospital encounter of 06/25/17  CT Cervical Spine Wo Contrast   Narrative CLINICAL DATA:  Fall at home [place] being on right side. Head injury. C-spine trauma with high clinical risk.  EXAM: CT HEAD WITHOUT CONTRAST  CT CERVICAL SPINE WITHOUT CONTRAST  TECHNIQUE: Multidetector CT imaging of the head and cervical spine was performed following the standard protocol without intravenous contrast. Multiplanar CT image reconstructions of the cervical spine were also generated.  COMPARISON:  09/13/2016  FINDINGS: CT HEAD FINDINGS  Brain: No evidence of acute infarction, hemorrhage, hydrocephalus, extra-axial collection or mass lesion/mass effect. Mild chronic small vessel ischemia in the periventricular white matter. Mild for age cerebral volume loss.  Vascular: Atherosclerotic calcification.  No hyperdense vessel.  Skull: Negative for fracture  Sinuses/Orbits: No evidence of injury. Right cataract resection and bilateral staphyloma.  CT CERVICAL SPINE FINDINGS  Alignment: No traumatic malalignment.  Skull base and vertebrae: Remote compression fractures of C7 and  T2. No acute fracture.  Soft tissues and spinal canal: No prevertebral fluid or swelling. No visible canal hematoma.  Disc levels:  Usual degenerative changes.  Upper chest: No evidence of injury.  IMPRESSION: No evidence of acute intracranial or cervical spine injury.   Electronically Signed   By: Monte Fantasia M.D.   On: 06/25/2017 10:19    Cervical DG 2-3 views:  Results for orders placed during the hospital encounter of 09/28/14  DG Cervical Spine 2 or 3 views   Narrative CLINICAL DATA:  Posterior neck pain, status post fall 4 days ago  EXAM: CERVICAL SPINE - 2-3 VIEW  COMPARISON:  Cervical spine CT scan of September 22, 2014  FINDINGS: The cervical vertebral bodies are preserved in height. The disc space heights are well maintained. There is mild multi level facet joint hypertrophy. There is degenerative change of the atlanto-dens articulation. The prevertebral soft tissue spaces are normal.  IMPRESSION: There is no acute bony abnormality of the cervical spine.   Electronically Signed   By: David  Martinique M.D.   On: 09/28/2014 15:49    Shoulder Imaging: Shoulder-L DG:  Results for orders placed during the hospital encounter of 03/26/16  DG Shoulder Left   Narrative CLINICAL DATA:  Syncopal episode, falling onto the left shoulder was subsequent pain.  EXAM: LEFT SHOULDER - 2+ VIEW  COMPARISON:  04/24/2015  FINDINGS: Humeral head is properly located. No evidence of regional fracture. No degenerative change. Normal humeral acromial distance.  IMPRESSION: Negative radiographs.   Electronically Signed   By: Nelson Chimes M.D.   On: 03/26/2016 15:26    Thoracic Imaging: Thoracic MR wo contrast:  Results for orders placed during the hospital encounter of 03/18/17  MR THORACIC SPINE WO CONTRAST   Narrative CLINICAL DATA:  Initial evaluation for low back pain radiating into right hip and lower extremity. Recent right hip fracture.  EXAM: MRI  THORACIC AND LUMBAR SPINE WITHOUT CONTRAST  TECHNIQUE: Multiplanar and multiecho pulse sequences of the thoracic and lumbar spine were obtained without intravenous contrast.  COMPARISON:  None available.  FINDINGS: MRI THORACIC SPINE FINDINGS  Alignment: Mild exaggeration of the normal thoracic kyphosis. No listhesis or subluxation.  Vertebrae: Mild chronic height loss at the T1 through T4 vertebral bodies. Vertebral body heights otherwise maintained. No evidence for acute for subacute fracture. Bone marrow signal intensity within normal limits. Prominent 14 mm hemangioma noted within the T6 vertebral body. No other discrete or worrisome osseous lesions.  Cord:  Signal intensity within the thoracic spinal cord is normal.  Paraspinal and other soft tissues: Paraspinous soft tissues within normal limits. Partially visualized lungs are grossly clear. Scattered T2 hyperintense cyst noted within the kidneys bilaterally.  Disc levels:  T9-10: Tiny left paracentral disc protrusion indents the ventral thecal sac (series 15, image 26). No stenosis.  T11-12: Shallow central disc protrusion mildly flattens the ventral thecal sac (series 15, image 33). No stenosis.  No other significant degenerative changes within the thoracic spine. No canal or foraminal stenosis.  MRI LUMBAR SPINE FINDINGS  Segmentation: When counting from the dens, there appear to be only 11 pairs of ribs. For the purposes of this dictation, the lowest well-formed disc space will be labeled the L5-S1 level, and the highest non rib-bearing vertebral body will be labeled L1.  Alignment: Mild rotoscoliosis. Vertebral bodies otherwise normally aligned with preservation of the normal lumbar lordosis. No listhesis.  Vertebrae: Vertebral body heights are maintained. No evidence for acute or chronic fracture. The bone marrow signal intensity within normal limits. No discrete or worrisome osseous lesions. No  abnormal marrow edema.  Conus medullaris: Extends to the L1-2 level and appears normal.  Paraspinal and other soft tissues: Paraspinous soft tissues demonstrate no acute abnormality. Scattered T2 hyperintense cyst noted within the kidneys, greater on the left. Visualized visceral structures otherwise unremarkable.  Disc levels:  L1-2:  Unremarkable.  L2-3:  Unremarkable.  L3-4: Diffuse degenerative disc bulge with disc desiccation. Annular fissure noted at the level of the right neural foramen. Moderate facet and ligamentum flavum hypertrophy. Reactive effusions present within the bilateral L3-4 facets. Mild canal and bilateral foraminal stenosis without neural impingement.  L4-5: Mild disc bulge with disc desiccation. Small posterior annular fissure noted. Mild facet and ligamentum flavum hypertrophy. No significance canal stenosis. Mild bilateral L4 foraminal narrowing without impingement.  L5-S1: Normal disc. Mild facet hypertrophy. No significant stenosis.  IMPRESSION: MR THORACIC SPINE IMPRESSION  1. Mild degenerative disc bulging at T9-10 and T11-12 without significant stenosis. 2. Otherwise unremarkable MRI of the thoracic spine. No other significant disc pathology. No canal or neural foraminal stenosis.  MR LUMBAR SPINE IMPRESSION  1. Degenerative disc bulge with facet hypertrophy at L3-4 with resultant mild canal and bilateral foraminal stenosis. 2. Degenerative disc bulge at L4-5 with resultant mild bilateral L4 foraminal narrowing. 3. Otherwise negative MRI of the lumbar spine. No other significant stenosis or evidence for neural impingement.   Electronically Signed   By: Jeannine Boga M.D.   On: 03/18/2017 15:33    Thoracic CT wo contrast:  Results for orders placed during the hospital encounter of 02/15/15  CT Thoracic Spine Wo Contrast   Narrative CLINICAL DATA:  [place] out of bed onto hardwood floors, head neck pain, recent upper thoracic  fracture, new fall  EXAM: CT THORACIC AND LUMBAR SPINE WITHOUT CONTRAST  TECHNIQUE: Multidetector CT imaging of the thoracic and lumbar spine was performed without contrast. Multiplanar CT image reconstructions were also generated.  COMPARISON:  CT thoracic spine 01/26/2015, lumbar spine radiographs 09/22/2014, CT abdomen and pelvis 10/17/2012  FINDINGS: CT THORACIC SPINE FINDINGS  Diffuse osseous demineralization.  Scattered atherosclerotic calcifications with ascending thoracic aorta 3.9 cm transverse image 42.  Additional atherosclerotic calcifications at coronary arteries and proximal great vessels.  No thoracic adenopathy.  Calcified granuloma LEFT upper lobe image 19.  Anterior compression fracture of T2 unchanged, involving both superior and inferior endplates.  Superior endplate compression fracture T3 unchanged.  Minimal concavity superior endplate T4 stable.  No new fracture, subluxation or bone destruction.  Visualized portions of ribs and scapulae unremarkable.  CT LUMBAR SPINE FINDINGS  Small calcified renal artery aneurysms bilaterally 10 mm RIGHT hand 8 mm LEFT.  Atherosclerotic calcifications aorta.  LEFT adrenal nodule 14 x 10 mm stable since 10/17/2012.  Based on numbering of ribs and thoracic spine, only 4 lumbar type vertebral bodies are identified.  No lumbar fracture or subluxation.  Mild scattered degenerative disc disease changes.  Inferior endplate concavities at L2, L3 and L4 are stable from prior radiographs.  SI joints symmetric.  Diffusely bulging discs at L1-L2, L2-L3, and L3-L4.  Significant multifactorial spinal stenosis at L2-L3.  IMPRESSION: CT THORACIC SPINE IMPRESSION  Stable compression fractures at T2 and T3.  Stable superior endplate concavity T4.  Osseous demineralization with scattered degenerative disc disease changes.  No acute abnormalities.  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  Recommend annual imaging followup by CTA or MRA. This recommendation follows 2010 ACCF/AHA/AATS/ACR/ASA/SCA/SCAI/SIR/STS/SVM Guidelines for the Diagnosis and Management of Patients with Thoracic Aortic Disease. Circulation.2010; 121: G549-I264  CT LUMBAR SPINE IMPRESSION  No acute lumbar spine abnormalities.  Stable inferior endplate concavities at L2, L3, and L4.  Bulging discs at multiple levels with significant multifactorial spinal stenosis likely present at L2-L3.  Small BILATERAL calcified renal artery aneurysms.   Electronically Signed   By: Lavonia Dana M.D.   On: 02/15/2015 20:46    Thoracic DG 2-3 views:  Results for orders placed during the hospital encounter of 05/22/15  DG Thoracic Spine 2 View   Narrative CLINICAL DATA:  77 year old female with acute on chronic back pain after falling 5 days previously. Pain localizes to the thoracic spine.  EXAM: THORACIC SPINE 2 VIEWS  COMPARISON:  Prior CT scan of the thoracic spine 02/15/2015  FINDINGS: There is no evidence of thoracic spine fracture. Alignment is normal. No other significant bone abnormalities are identified. Atherosclerotic vascular calcifications present in both renal arteries proximally.  IMPRESSION: Negative.   Electronically Signed   By: Jacqulynn Cadet M.D.   On: 05/22/2015 16:41    Thoracic DG w/swimmers view:  Results for orders placed during the hospital encounter of 03/29/16  DG Thoracic Spine W/Swimmers   Narrative CLINICAL DATA:  [place] 3 days ago, mid back pain, history osteoporosis, osteoarthritis  EXAM: THORACIC SPINE - 3 VIEWS  COMPARISON:  Chest radiograph 01/23/2016  FINDINGS: Osseous demineralization.  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  Minimal biconvex scoliosis.  Mild chronic superior endplate height loss of T2 vertebral body.  Minimal superior endplate concavities at T3 and T4, also chronic.  No acute fracture, subluxation,  or bone destruction.  IMPRESSION: Osseous mineralization.  Chronic superior endplate changes at B5-A3 as above.  No acute abnormalities.   Electronically Signed   By: Lavonia Dana M.D.   On: 03/29/2016 12:16    Lumbosacral Imaging: Lumbar MR wo contrast:  Results for orders placed during the hospital encounter of 03/18/17  MR LUMBAR SPINE WO CONTRAST   Narrative CLINICAL DATA:  Initial evaluation for low back pain radiating into right hip and lower extremity. Recent right hip fracture.  EXAM: MRI THORACIC AND LUMBAR SPINE WITHOUT CONTRAST  TECHNIQUE: Multiplanar and multiecho pulse sequences of the thoracic and lumbar spine were obtained without intravenous contrast.  COMPARISON:  None available.  FINDINGS: MRI THORACIC SPINE FINDINGS  Alignment: Mild exaggeration of the normal thoracic kyphosis. No listhesis or subluxation.  Vertebrae: Mild chronic height loss at the T1 through  T4 vertebral bodies. Vertebral body heights otherwise maintained. No evidence for acute for subacute fracture. Bone marrow signal intensity within normal limits. Prominent 14 mm hemangioma noted within the T6 vertebral body. No other discrete or worrisome osseous lesions.  Cord:  Signal intensity within the thoracic spinal cord is normal.  Paraspinal and other soft tissues: Paraspinous soft tissues within normal limits. Partially visualized lungs are grossly clear. Scattered T2 hyperintense cyst noted within the kidneys bilaterally.  Disc levels:  T9-10: Tiny left paracentral disc protrusion indents the ventral thecal sac (series 15, image 26). No stenosis.  T11-12: Shallow central disc protrusion mildly flattens the ventral thecal sac (series 15, image 33). No stenosis.  No other significant degenerative changes within the thoracic spine. No canal or foraminal stenosis.  MRI LUMBAR SPINE FINDINGS  Segmentation: When counting from the dens, there appear to be only 11 pairs of  ribs. For the purposes of this dictation, the lowest well-formed disc space will be labeled the L5-S1 level, and the highest non rib-bearing vertebral body will be labeled L1.  Alignment: Mild rotoscoliosis. Vertebral bodies otherwise normally aligned with preservation of the normal lumbar lordosis. No listhesis.  Vertebrae: Vertebral body heights are maintained. No evidence for acute or chronic fracture. The bone marrow signal intensity within normal limits. No discrete or worrisome osseous lesions. No abnormal marrow edema.  Conus medullaris: Extends to the L1-2 level and appears normal.  Paraspinal and other soft tissues: Paraspinous soft tissues demonstrate no acute abnormality. Scattered T2 hyperintense cyst noted within the kidneys, greater on the left. Visualized visceral structures otherwise unremarkable.  Disc levels:  L1-2:  Unremarkable.  L2-3:  Unremarkable.  L3-4: Diffuse degenerative disc bulge with disc desiccation. Annular fissure noted at the level of the right neural foramen. Moderate facet and ligamentum flavum hypertrophy. Reactive effusions present within the bilateral L3-4 facets. Mild canal and bilateral foraminal stenosis without neural impingement.  L4-5: Mild disc bulge with disc desiccation. Small posterior annular fissure noted. Mild facet and ligamentum flavum hypertrophy. No significance canal stenosis. Mild bilateral L4 foraminal narrowing without impingement.  L5-S1: Normal disc. Mild facet hypertrophy. No significant stenosis.  IMPRESSION: MR THORACIC SPINE IMPRESSION  1. Mild degenerative disc bulging at T9-10 and T11-12 without significant stenosis. 2. Otherwise unremarkable MRI of the thoracic spine. No other significant disc pathology. No canal or neural foraminal stenosis.  MR LUMBAR SPINE IMPRESSION  1. Degenerative disc bulge with facet hypertrophy at L3-4 with resultant mild canal and bilateral foraminal stenosis. 2.  Degenerative disc bulge at L4-5 with resultant mild bilateral L4 foraminal narrowing. 3. Otherwise negative MRI of the lumbar spine. No other significant stenosis or evidence for neural impingement.   Electronically Signed   By: Jeannine Boga M.D.   On: 03/18/2017 15:33    Lumbar CT wo contrast:  Results for orders placed during the hospital encounter of 02/15/15  CT Lumbar Spine Wo Contrast   Narrative CLINICAL DATA:  [place] out of bed onto hardwood floors, head neck pain, recent upper thoracic fracture, new fall  EXAM: CT THORACIC AND LUMBAR SPINE WITHOUT CONTRAST  TECHNIQUE: Multidetector CT imaging of the thoracic and lumbar spine was performed without contrast. Multiplanar CT image reconstructions were also generated.  COMPARISON:  CT thoracic spine 01/26/2015, lumbar spine radiographs 09/22/2014, CT abdomen and pelvis 10/17/2012  FINDINGS: CT THORACIC SPINE FINDINGS  Diffuse osseous demineralization.  Scattered atherosclerotic calcifications with ascending thoracic aorta 3.9 cm transverse image 42.  Additional atherosclerotic calcifications at coronary arteries and proximal  great vessels.  No thoracic adenopathy.  Calcified granuloma LEFT upper lobe image 19.  Anterior compression fracture of T2 unchanged, involving both superior and inferior endplates.  Superior endplate compression fracture T3 unchanged.  Minimal concavity superior endplate T4 stable.  No new fracture, subluxation or bone destruction.  Visualized portions of ribs and scapulae unremarkable.  CT LUMBAR SPINE FINDINGS  Small calcified renal artery aneurysms bilaterally 10 mm RIGHT hand 8 mm LEFT.  Atherosclerotic calcifications aorta.  LEFT adrenal nodule 14 x 10 mm stable since 10/17/2012.  Based on numbering of ribs and thoracic spine, only 4 lumbar type vertebral bodies are identified.  No lumbar fracture or subluxation.  Mild scattered degenerative disc disease  changes.  Inferior endplate concavities at L2, L3 and L4 are stable from prior radiographs.  SI joints symmetric.  Diffusely bulging discs at L1-L2, L2-L3, and L3-L4.  Significant multifactorial spinal stenosis at L2-L3.  IMPRESSION: CT THORACIC SPINE IMPRESSION  Stable compression fractures at T2 and T3.  Stable superior endplate concavity T4.  Osseous demineralization with scattered degenerative disc disease changes.  No acute abnormalities.  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  Recommend annual imaging followup by CTA or MRA. This recommendation follows 2010 ACCF/AHA/AATS/ACR/ASA/SCA/SCAI/SIR/STS/SVM Guidelines for the Diagnosis and Management of Patients with Thoracic Aortic Disease. Circulation.2010; 121: I627-O350  CT LUMBAR SPINE IMPRESSION  No acute lumbar spine abnormalities.  Stable inferior endplate concavities at L2, L3, and L4.  Bulging discs at multiple levels with significant multifactorial spinal stenosis likely present at L2-L3.  Small BILATERAL calcified renal artery aneurysms.   Electronically Signed   By: Lavonia Dana M.D.   On: 02/15/2015 20:46    Lumbar DG 2-3 views:  Results for orders placed during the hospital encounter of 06/25/17  DG Lumbar Spine 2-3 Views   Narrative CLINICAL DATA:  77 year old female with a history of fall  EXAM: LUMBAR SPINE - 2-3 VIEW  COMPARISON:  CT 10/23/2016  FINDINGS: Lumbar Spine:  Lumbar vertebral elements maintain normal alignment without evidence of anterolisthesis, retrolisthesis, subluxation.  No fracture line identified. Vertebral body heights maintained.  No significant disc space narrowing. Mild endplate changes throughout the lumbar spine.  Osteopenia.  Vascular calcifications  IMPRESSION: No radiographic evidence of acute fracture or malalignment of the lumbar spine.  Osteopenia.  Mild degenerative changes.   Electronically Signed    By: Corrie Mckusick D.O.   On: 06/25/2017 10:32    Lumbar DG (Complete) 4+V:  Results for orders placed during the hospital encounter of 11/19/16  DG Lumbar Spine Complete   Narrative CLINICAL DATA:  Low back pain for many years.  No specific injury.  EXAM: LUMBAR SPINE - COMPLETE 4+ VIEW  COMPARISON:  CT, 10/23/2016  FINDINGS: There are fractures of posterior twelfth ribs bilaterally, adjacent to the costovertebral junction. These may be recent. These were not evident on the prior chest CT.  No other fractures. All lumbar vertebral bodies normal in height. Lumbar disc spaces are well maintained. No significant degenerative change. Facet joints are well maintained.  Bones are demineralized. There are scattered inferior abdominal aortic vascular calcifications. Soft tissues otherwise unremarkable.  IMPRESSION: 1. Apparent fractures of the twelfth ribs bilaterally adjacent to the costovertebral joints. These could be from the fall reported few months ago. 2. No other evidence of a fracture or acute abnormality.   Electronically Signed   By: Lajean Manes M.D.   On: 11/19/2016 16:46    Hip Imaging: Hip-R MR wo contrast:  Results for orders placed during the hospital encounter of 01/01/17  MR HIP RIGHT WO CONTRAST   Narrative CLINICAL DATA:  Patient fell on a prolonged broke right hip. Right hip pain and groin pain with limited range of motion.  EXAM: MR OF THE RIGHT HIP WITHOUT CONTRAST  TECHNIQUE: Multiplanar, multisequence MR imaging was performed. No intravenous contrast was administered.  COMPARISON:  CT from 12/05/2016  FINDINGS: Bones: Susceptibility artifact from right femoral nail fixation across an intratrochanteric fracture of the right femur is identified. Detail of the fracture, better visualized on the recent CT is compromised as result. There appears to be marrow edema of the weight-bearing portion of the femoral head. Curvilinear hypointensity of  the subcortical right femoral head raises the possibility of avascular necrosis, series 2, image 20. Sub chondral degenerative cyst of the acetabular roof is seen on the right.  Articular cartilage and labrum  Articular cartilage: Given joint space narrowing of both hips, and there is likely moderate thinning 10 of the acetabular and femoral head cartilage bilaterally.  Labrum: No definite labral tear though assessment is limited by lack of joint fluid.  Joint or bursal effusion  Joint effusion:  No joint effusion  Bursae:  No bursal fluid collections  Muscles and tendons  Muscles and tendons: Mild edema of the right gluteus muscles possibly representing stigmata of mild strain or tendinopathy.  Other findings  Miscellaneous: The included SI joints are nonacute. The pubic symphysis is maintained. No sacral insufficiency fracture is identified. The patient is status post hysterectomy. No adnexal mass is noted.  IMPRESSION: 1. Limited study due to pre-existing right femoral nail fixation causing susceptibility artifacts through a known intertrochanteric fracture of the right femur. 2. Despite susceptibility artifacts, there does appear to be some evidence of marrow edema along the weight-bearing portion of the right femoral head. A curvilinear hypointensity seen along involving the subcortical bone and the possibility of AVN is not excluded. 3. Marrow edema adjacent to the greater trochanter likely representing mild gluteal muscle strain.   Electronically Signed   By: Ashley Royalty M.D.   On: 01/01/2017 23:11    Hip-R CT wo contrast:  Results for orders placed during the hospital encounter of 06/27/17  CT Hip Right Wo Contrast   Narrative CLINICAL DATA:  Status fall 2 days ago.  Right hip pain.  EXAM: CT OF THE RIGHT HIP WITHOUT CONTRAST  TECHNIQUE: Multidetector CT imaging of the right hip was performed according to the standard protocol. Multiplanar CT image  reconstructions were also generated.  COMPARISON:  None.  FINDINGS: Bones/Joint/Cartilage  Chronic ununited intertrochanteric fracture with sclerotic margins on either side of the fracture cleft transfixed with an intramedullary nail and interlocking femoral neck screw.  No acute fracture or dislocation.  Normal alignment. No joint effusion.  Severe axial right hip joint space narrowing with a bone-on-bone appearance and subchondral reactive marrow changes with small inferior humeral marginal osteophytes consistent with advanced osteoarthritis.  Ligaments  Ligaments are suboptimally evaluated by CT.  Muscles and Tendons Muscles are normal.  No muscle atrophy.  Soft tissue No fluid collection or hematoma.  No soft tissue mass.  IMPRESSION: 1. 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 2.  No acute osseous injury of the right hip. 3. Severe osteoarthritis of the right hip.   Electronically Signed   By: Kathreen Devoid   On: 06/27/2017 15:22  Hip-R DG 2-3 views:  Results for orders placed during the hospital encounter of 06/25/17  DG Hip Unilat W or Wo Pelvis 2-3 Views Right   Narrative CLINICAL DATA:  Right hip pain since a fall today. Initial encounter.  EXAM: DG HIP (WITH OR WITHOUT PELVIS) 2-3V RIGHT  COMPARISON:  Plain films right hip 11/21/2016.  FINDINGS: No acute bony or joint abnormality is identified. Healed right intertrochanteric fracture with fixation hardware in place is unchanged in appearance. Mild degenerative disease is present about the hips.  IMPRESSION: No acute abnormality.   Electronically Signed   By: Inge Rise M.D.   On: 06/25/2017 10:36    Hip-L DG 2-3 views:  Results for orders placed during the hospital encounter of 03/26/16  DG Hip Unilat With Pelvis 2-3 Views Left   Narrative  CLINICAL DATA:  Syncopal episode falling onto the left side with left-sided pain.  EXAM: DG HIP (WITH OR WITHOUT PELVIS) 2-3V LEFT  COMPARISON:  None.  FINDINGS: No pelvic fracture. No hip fracture. No significant degenerative change.  IMPRESSION: Negative radiographs.   Electronically Signed   By: Nelson Chimes M.D.   On: 03/26/2016 15:27    Knee Imaging: Knee-L MR w contrast:  Results for orders placed during the hospital encounter of 09/04/16  MR KNEE LEFT WO CONTRAST   Narrative CLINICAL DATA:  Feels like the knee is popping out.  Pain.  EXAM: MRI OF THE LEFT KNEE WITHOUT CONTRAST  TECHNIQUE: Multiplanar, multisequence MR imaging of the knee was performed. No intravenous contrast was administered.  COMPARISON:  None.  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 No obvious periarticular fluid collection. No osteolysis. No acute fracture or dislocation. Overall alignment is anatomic. No Baker cyst.  Ligaments  Collateral ligaments are obscured by susceptibility artifact.  Muscles and Tendons No muscle signal abnormality. No muscle atrophy or edema. No intramuscular fluid collection or hematoma. Patellar tendon and quadriceps tendon are intact.  Soft tissue No fluid collection or hematoma. No soft tissue mass. Popliteal fossa is normal. Normal neurovascular bundles.  IMPRESSION: 1. 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 No evidence of hardware failure or complication. 2. No Baker cyst.   Electronically Signed   By: Kathreen Devoid   On: 09/04/2016 13:51    Knee-R MR wo contrast:  Results for orders placed during the hospital encounter of 01/01/17  MR KNEE RIGHT WO CONTRAST   Narrative CLINICAL DATA:  Pain after fall in April. Right knee pain  and swelling.  EXAM: MRI OF THE RIGHT KNEE WITHOUT CONTRAST  TECHNIQUE: Multiplanar, multisequence MR imaging of the knee was performed. No intravenous contrast was administered.  COMPARISON:  11/19/2016  FINDINGS: Markedly compromised study due to susceptibility artifacts from an indwelling long-stem semi constrained knee arthroplasty.  Imaging was performed in an attempt to identify any potentially occult periprosthetic fracture. No such fracture or edema is identified. No significant joint effusion. Generalized muscle atrophy is seen.  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 No occult fracture about the femoral or tibial stem are identified. There is generalized muscle atrophy about the knee. No significant fluid collections are apparent.   Electronically Signed   By: Ashley Royalty M.D.   On: 01/01/2017 23:14    Knee-R DG 1-2 views:  Results for orders placed during the hospital encounter of 11/19/16  DG Knee 1-2 Views Right  Narrative CLINICAL DATA:  Patient reports right knee pain that is worse when attempting to flex knee. Patient has had multiple right knee surgeries. No new or recent injuries.  EXAM: RIGHT KNEE - 1-2 VIEW  COMPARISON:  09/27/2016  FINDINGS: No fracture or bone lesion.  The femoral and tibial prosthetic components are well-seated with no evidence of loosening.  No joint effusion.  Soft tissues are unremarkable.  Bones are demineralized.  IMPRESSION: 1. No fracture, bone lesion or evidence of loosening of the orthopedic hardware.   Electronically Signed   By: Lajean Manes M.D.   On: 11/19/2016 16:47    Knee-R DG 4 views:  Results for orders placed during the hospital encounter of 09/27/16  DG Knee Complete 4 Views Right   Narrative CLINICAL DATA:  77 year old female with a history of right knee pain. Prior fracture  EXAM: RIGHT KNEE - COMPLETE 4+ VIEW  COMPARISON:   09/13/2016  FINDINGS: Surgical changes of right knee arthroplasty. No evidence of perihardware fracture. No joint effusion. No focal soft tissue swelling. No displaced fracture of the visualized femur or proximal lower extremity.  IMPRESSION: No acute bony abnormality.  Surgical changes of prior knee arthroplasty.   Electronically Signed   By: Corrie Mckusick D.O.   On: 09/27/2016 13:32    Knee-L DG 4 views:  Results for orders placed during the hospital encounter of 11/14/07  DG Knee Complete 4 Views Left   Narrative Clinical Data: 77 year old female status post knee dislocation. Pain.   LEFT KNEE - COMPLETE 4+ VIEW   Comparison: 04/29/2007.   Findings: The patient is status post left total knee arthroplasty. The knee is located.  A moderate sized joint effusion is seen. There is no evidence for acute fracture.   IMPRESSION:   1.  Moderate sized joint effusion without underlying fracture dislocation. 2.  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  Provider: Rayna Sexton   Ankle Imaging: Ankle-L DG Complete:  Results for orders placed during the hospital encounter of 03/09/10  DG Ankle Complete Left   Narrative Clinical Data: [place] down stairs - pain and ankle   LEFT ANKLE COMPLETE - 3+ VIEW   Comparison: None.   Findings: There is a medial soft tissue swelling.  No fracture or dislocation.  Bones mildly demineralized.   IMPRESSION: Normal except for medial soft tissue swelling.  Provider: Elvera Lennox   Foot Imaging: Foot-L DG Complete:  Results for orders placed during the hospital encounter of 03/09/10  DG Foot Complete Left   Narrative Clinical Data: [place] down stairs - laceration in the region of the fifth metatarsal   LEFT FOOT - COMPLETE 3+ VIEW   Comparison: None.   Findings: No fracture or dislocation.  There is a skin defect over the base of the fifth metatarsal.  No foreign body in the soft tissues.  Small calcaneal spur.   Bones somewhat demineralized.   IMPRESSION: No acute findings.  There is a laceration near the base of the fifth metatarsal.  Provider: Elvera Lennox   Complexity Note: Imaging results reviewed. Results shared with Ms. Kaser, using Layman's terms.                         Meds   Current Outpatient Medications:  .  albuterol (PROVENTIL HFA;VENTOLIN HFA) 108 (90 BASE) MCG/ACT inhaler, Inhale 1 puff into the lungs every 6 (six) hours as needed for wheezing or shortness of breath., Disp: , Rfl:  .  albuterol (PROVENTIL) (2.5 MG/3ML) 0.083% nebulizer solution, Take 2.5 mg by nebulization every 6 (six) hours as needed for wheezing or shortness of breath., Disp: , Rfl:  .  ALPRAZolam (XANAX) 1 MG tablet, Take 1 mg by mouth at bedtime as needed. , Disp: , Rfl:  .  amLODipine (NORVASC) 5 MG tablet, Take 5 mg by mouth daily. , Disp: , Rfl:  .  Calcium-Vitamin D-Vitamin K (CALCIUM SOFT CHEWS PO), Take by mouth daily at 6 (six) AM., Disp: , Rfl:  .  citalopram (CELEXA) 20 MG tablet, Take 20 mg by mouth daily. , Disp: , Rfl:  .  diphenhydrAMINE (BENADRYL) 25 MG tablet, Take 25 mg by mouth as needed., Disp: , Rfl:  .  docusate sodium (COLACE) 100 MG capsule, Take 100 mg by mouth as needed for mild constipation., Disp: , Rfl:  .  esomeprazole (NEXIUM) 40 MG capsule, Take 40 mg by mouth 2 (two) times daily before a meal. , Disp: , Rfl:  .  Fexofenadine HCl (MUCINEX ALLERGY PO), Take by mouth as needed., Disp: , Rfl:  .  lovastatin (MEVACOR) 10 MG tablet, Take 10 mg by mouth daily., Disp: , Rfl:  .  meloxicam (MOBIC) 15 MG tablet, Take 15 mg by mouth daily. , Disp: , Rfl:  .  omega-3 fish oil (MAXEPA) 1000 MG CAPS capsule, Take by mouth daily at 6 (six) AM., Disp: , Rfl:  .  ondansetron (ZOFRAN) 4 MG tablet, Take 4 mg by mouth every 8 (eight) hours as needed for nausea. , Disp: , Rfl:  .  orphenadrine (NORFLEX) 100 MG tablet, Take 1 tablet (100 mg total) by mouth 2 (two) times daily., Disp: 60 tablet, Rfl:  1 .  oxyCODONE (ROXICODONE) 5 MG immediate release tablet, Take 1 tablet (5 mg total) by mouth every 8 (eight) hours as needed., Disp: 20 tablet, Rfl: 0 .  polyethylene glycol powder (GLYCOLAX/MIRALAX) powder, Take by mouth as directed. , Disp: , Rfl:  .  promethazine (PHENERGAN) 12.5 MG suppository, Place 12.5 mg rectally as needed for nausea or vomiting., Disp: , Rfl:  .  senna (SENOKOT) 8.6 MG TABS tablet, Take 2 tablets by mouth. constipation, Disp: , Rfl:  .  traZODone (DESYREL) 100 MG tablet, Take 50 mg by mouth at bedtime. , Disp: , Rfl:  .  baclofen (LIORESAL) 10 MG tablet, Take 1 tablet (10 mg total) by mouth daily., Disp: 30 tablet, Rfl: 2 .  diclofenac sodium (VOLTAREN) 1 % GEL, Apply 2 g topically 4 (four) times daily., Disp: 100 g, Rfl: 2 .  gabapentin (NEURONTIN) 400 MG capsule, Take 1 capsule (400 mg total) by mouth 4 (four) times daily., Disp: 120 capsule, Rfl: 2 .  HYDROcodone-acetaminophen (NORCO/VICODIN) 5-325 MG tablet, Take 1 tablet by mouth every 8 (eight) hours. (Patient not taking: Reported on 06/27/2017), Disp: 90 tablet, Rfl: 0  ROS  Constitutional: Denies any fever or chills Gastrointestinal: No reported hemesis, hematochezia, vomiting, or acute GI distress Musculoskeletal: Denies any acute onset joint swelling, redness, loss of ROM, or weakness Neurological: No reported episodes of acute onset apraxia, aphasia, dysarthria, agnosia, amnesia, paralysis, loss of coordination, or loss of consciousness  Allergies  Ms. Schmiesing is allergic to ampicillin-sulbactam sodium; ampicillin; toradol [ketorolac tromethamine]; tramadol; flexeril [cyclobenzaprine]; ambien [zolpidem tartrate]; cephalexin; ketorolac; percocet [oxycodone-acetaminophen]; sulfamethoxazole-trimethoprim; and tape.  [place]  Drug: Ms. Josey  reports that she does not use drugs. Alcohol:  reports that she does not drink alcohol. Tobacco:  reports that she has quit smoking. She  has a 45.00 pack-year smoking history.  She uses smokeless tobacco. Medical:  has a past medical history of Anemia, B12 deficiency, Bacteremia (10/07/2014), Bladder incontinence, Blind left eye, Cataract, Cervical spine fracture ([place]), Chest pain (07/29/2013), Compression fracture, COPD (chronic obstructive pulmonary disease) ([place]), DDD (degenerative disc disease), lumbar, Depression, Diffuse myofascial pain syndrome (03/24/2015), Fever (10/05/2014), GERD (gastroesophageal reflux disease), Hiatal hernia, Hyperlipidemia, Hypertension, Hypertensive kidney disease, malignant (11/07/2016), Macular degeneration, Osteoarthritis, Osteoporosis, Reactive airway disease, Rupture of bowel ([place]), Sepsis ([place]) (10/08/2014), Stroke [facility]), Stroke ([place]), and Wrist pain, acute (09/10/2012). Surgical: Ms. Rahl  has a past surgical history that includes Abdominal surgery; Colon surgery; Joint replacement; Appendectomy; Bladder surgery; Rectocele repair; Knee surgery; Cholecystectomy; Abdominal hysterectomy; Ostomy; Eye surgery; Intramedullary (im) nail intertrochanteric (Right, 09/13/2016); and Hip surgery (Right, 08/2016). Family: family history includes Heart attack in her father; Heart failure in her mother.  Constitutional Exam  General appearance: Well nourished, well developed, and well hydrated. In no apparent acute distress Vitals:   07/02/17 1358  BP: 140/79  Pulse: 90  Resp: 16  Temp: 98 F (36.7 C)  TempSrc: Oral  SpO2: 94%  Weight: 186 lb (84.4 kg)  Height: '5\' 5"'  (1.651 m)   BMI Assessment: Estimated body mass index is 30.95 kg/m as calculated from the following:   Height as of this encounter: '5\' 5"'  (1.651 m).   Weight as of this encounter: 186 lb (84.4 kg).  BMI interpretation table: BMI level Category Range association with higher incidence of chronic pain  <18 kg/m2 Underweight   18.5-24.9 kg/m2 Ideal body weight   25-29.9 kg/m2 Overweight Increased incidence by 20%  30-34.9 kg/m2 Obese (Class I) Increased incidence by 68%  35-39.9  kg/m2 Severe obesity (Class II) Increased incidence by 136%  >40 kg/m2 Extreme obesity (Class III) Increased incidence by 254%   BMI Readings from Last 4 Encounters:  07/02/17 30.95 kg/m  06/27/17 30.79 kg/m  04/23/17 36.13 kg/m  04/03/17 32.44 kg/m   Wt Readings from Last 4 Encounters:  07/02/17 186 lb (84.4 kg)  06/27/17 185 lb (83.9 kg)  04/23/17 185 lb (83.9 kg)  04/03/17 189 lb (85.7 kg)  Psych/Mental status: Alert, oriented x 3 (person, place, & time)       Eyes: PERLA Respiratory: No evidence of acute respiratory distress  Cervical Spine Area Exam  Skin & Axial Inspection: No masses, redness, edema, swelling, or associated skin lesions Alignment: Symmetrical Functional ROM: Unrestricted ROM      Stability: No instability detected Muscle Tone/Strength: Functionally intact. No obvious neuro-muscular anomalies detected. Sensory (Neurological): Unimpaired Palpation: No palpable anomalies              Upper Extremity (UE) Exam    Side: Right upper extremity  Side: Left upper extremity  Skin & Extremity Inspection: Skin color, temperature, and hair growth are WNL. No peripheral edema or cyanosis. No masses, redness, swelling, asymmetry, or associated skin lesions. No contractures.  Skin & Extremity Inspection: Skin color, temperature, and hair growth are WNL. No peripheral edema or cyanosis. No masses, redness, swelling, asymmetry, or associated skin lesions. No contractures.  Functional ROM: Unrestricted ROM          Functional ROM: Unrestricted ROM          Muscle Tone/Strength: Functionally intact. No obvious neuro-muscular anomalies detected.  Muscle Tone/Strength: Functionally intact. No obvious neuro-muscular anomalies detected.  Sensory (Neurological): Unimpaired          Sensory (Neurological): Unimpaired  Palpation: No palpable anomalies              Palpation: No palpable anomalies              Specialized Test(s): Deferred         Specialized Test(s):  Deferred          Thoracic Spine Area Exam  Skin & Axial Inspection: No masses, redness, or swelling Alignment: Symmetrical Functional ROM: Unrestricted ROM Stability: No instability detected Muscle Tone/Strength: Functionally intact. No obvious neuro-muscular anomalies detected. Sensory (Neurological): Unimpaired Muscle strength & Tone: No palpable anomalies  Lumbar Spine Area Exam  Skin & Axial Inspection: No masses, redness, or swelling Alignment: Symmetrical Functional ROM: Minimal ROM      Stability: No instability detected Muscle Tone/Strength: Increased muscle tone over affected area Sensory (Neurological): Movement-associated pain Palpation: Trigger Point over PSIS area on the right side       Provocative Tests: Lumbar Hyperextension and rotation test: Positive bilaterally for facet joint pain. (R>L) Lumbar Lateral bending test: evaluation deferred today       Patrick's Maneuver: Positive for right-sided S-I arthralgia and for bilateral hip arthralgia  Gait & Posture Assessment  Ambulation: Patient ambulates using a wheel chair Gait: Very limited, using assistive device to ambulate Posture: Antalgic   Lower Extremity Exam    Side: Right lower extremity  Side: Left lower extremity  Skin & Extremity Inspection: Skin color, temperature, and hair growth are WNL. No peripheral edema or cyanosis. No masses, redness, swelling, asymmetry, or associated skin lesions. No contractures.  Skin & Extremity Inspection: Skin color, temperature, and hair growth are WNL. No peripheral edema or cyanosis. No masses, redness, swelling, asymmetry, or associated skin lesions. No contractures.  Functional ROM: Pain restricted ROM for hip and knee joints  Functional ROM: Pain restricted ROM for hip and knee joints  Muscle Tone/Strength: Deconditioned  Muscle Tone/Strength: Deconditioned  Sensory (Neurological): Arthropathic arthralgia of right hip and knee  Sensory (Neurological): Arthropathic  arthralgia of hip and knee  Palpation: Complains of area being tender to palpation  Palpation: Complains of area being tender to palpation   Assessment  Primary Diagnosis & Pertinent Problem List: The primary encounter diagnosis was Chronic hip pain (Right). Diagnoses of Intertrochanteric fracture of femur, sequela (Right), Chronic knee pain (Primary Area of Pain) (Right), Chronic neck pain (Secondary Area of Pain) (Bilateral) (L>R), Chronic low back pain (Tertiary Area of Pain) (Bilateral) (L>R), Lumbar facet syndrome (Bilateral) (L>R), Chronic right sacroiliac pain, Non-traumatic compression fracture of T3 thoracic vertebra, sequela, Chronic pain syndrome, High risk medication use, At high risk for falls, and Pharmacologic therapy were also pertinent to this visit.  Status Diagnosis  Worsened Stable Persistent 1. Chronic hip pain (Right)   2. Intertrochanteric fracture of femur, sequela (Right)   3. Chronic knee pain (Primary Area of Pain) (Right)   4. Chronic neck pain (Secondary Area of Pain) (Bilateral) (L>R)   5. Chronic low back pain [facility] Area of Pain) (Bilateral) (L>R)   6. Lumbar facet syndrome (Bilateral) (L>R)   7. Chronic right sacroiliac pain   8. Non-traumatic compression fracture of T3 thoracic vertebra, sequela   9. Chronic pain syndrome   10. High risk medication use   11. At high risk for falls   12. Pharmacologic therapy     Problems updated and reviewed during this visit: Problem  Non-Traumatic Compression Fracture of T3 Thoracic Vertebra, Sequela  Chronic sacroiliac joint pain (Right)  Lumbar facet arthropathy (Bilateral)  Intertrochanteric fracture  of femur, sequela (Right)  Chronic shoulder pain (Left)  High Risk Medication Use  At High Risk for Falls   Plan of Care  Pharmacotherapy (Medications Ordered): No orders of the defined types were placed in this encounter.  Medications administered today: Eldene L. Hoadley had no medications administered  during this visit.   Procedure Orders     Radiofrequency,Genicular     LUMBAR FACET(MEDIAL BRANCH NERVE BLOCK) MBNB     SACROILIAC JOINT INJECTION Lab Orders  No laboratory test(s) ordered today   Imaging Orders  No imaging studies ordered today   Referral Orders  No referral(s) requested today    Interventional management options: Planned, scheduled, and/or pending:   Scheduled to undergo a left knee revision by Dr. Lorre Nick on 07/28/2017. Diagnostic right-sided lumbar facet block + right-sided sacroiliac joint block under fluoroscopic guidance and IV sedation Therapeutic right-sided genicular nerve RFA    Considering:   Diagnostic right-sided intra-articular knee joint injection Possible series of 5 right-sided intra-articular [place] knee injections Diagnostic right-sided genicular nerve block Possible right-sided genicular nerve RFA Diagnostic left cervical epidural steroid injection Diagnostic bilateral cervical facet blocks Diagnostic bilateral intra-articular shoulder joint injection Diagnostic bilateral suprascapular nerve block Possible bilateral suprascapular nerve RFA Diagnostic bilateral lumbar facet block Possible bilateral lumbar facet RFA   Palliative PRN treatment(s):   None at this time   Provider-requested follow-up: Return for Procedure (w/ sedation): (R) L-FCT + (R) SI BLK.  Future Appointments  Date Time Provider [place]  07/03/2017  8:15 AM Milinda Pointer, MD [facility] None   Primary Care Physician: Wenda Low, MD Location: [facility] Facility Note by: Gaspar Cola, MD Date: 07/02/2017; Time: 5:34 PM

## 2017-07-02 NOTE — Patient Instructions (Signed)
Preparing for Procedure with Sedation Instructions: . Oral Intake: Do not eat or drink anything for at least 8 hours prior to your procedure. . Transportation: Public transportation is not allowed. Bring an adult driver. The driver must be physically present in our waiting room before any procedure can be started. . Physical Assistance: Bring an adult physically capable of assisting you, in the event you need help. This adult should keep you company at home for at least 6 hours after the procedure. . Blood Pressure Medicine: Take your blood pressure medicine with a sip of water the morning of the procedure. . Blood thinners:  . Diabetics on insulin: Notify the staff so that you can be scheduled 1st case in the morning. If your diabetes requires high dose insulin, take only  of your normal insulin dose the morning of the procedure and notify the staff that you have done so. . Preventing infections: Shower with an antibacterial soap the morning of your procedure. . Build-up your immune system: Take 1000 mg of Vitamin C with every meal (3 times a day) the day prior to your procedure. . Antibiotics: Inform the staff if you have a condition or reason that requires you to take antibiotics before dental procedures. . Pregnancy: If you are pregnant, call and cancel the procedure. . Sickness: If you have a cold, fever, or any active infections, call and cancel the procedure. . Arrival: You must be in the facility at least 30 minutes prior to your scheduled procedure. . Children: Do not bring children with you. . Dress appropriately: Bring dark clothing that you would not mind if they get stained. . Valuables: Do not bring any jewelry or valuables. Procedure appointments are reserved for interventional treatments only. . No Prescription Refills. . No medication changes will be discussed during procedure appointments. . No disability issues will be  discussed. ______________________________________________________________________________________________   

## 2017-07-03 ENCOUNTER — Ambulatory Visit (HOSPITAL_BASED_OUTPATIENT_CLINIC_OR_DEPARTMENT_OTHER): Payer: Medicare Other | Admitting: Pain Medicine

## 2017-07-03 ENCOUNTER — Ambulatory Visit
Admission: RE | Admit: 2017-07-03 | Discharge: 2017-07-03 | Disposition: A | Discharge status: Home / Self Care | Payer: Medicare Other | Source: Ambulatory Visit | Attending: Pain Medicine | Admitting: Pain Medicine

## 2017-07-03 ENCOUNTER — Encounter: Payer: Self-pay | Admitting: Pain Medicine

## 2017-07-03 VITALS — BP 110/49 | HR 82 | Temp 98.2°F | Resp 24 | Ht 65.0 in | Wt 186.0 lb

## 2017-07-03 DIAGNOSIS — G8929 Other chronic pain: Secondary | ICD-10-CM | POA: Insufficient documentation

## 2017-07-03 DIAGNOSIS — M533 Sacrococcygeal disorders, not elsewhere classified: Secondary | ICD-10-CM

## 2017-07-03 DIAGNOSIS — M47816 Spondylosis without myelopathy or radiculopathy, lumbar region: Secondary | ICD-10-CM | POA: Insufficient documentation

## 2017-07-03 DIAGNOSIS — M4686 Other specified inflammatory spondylopathies, lumbar region: Secondary | ICD-10-CM | POA: Insufficient documentation

## 2017-07-03 DIAGNOSIS — Z888 Allergy status to other drugs, medicaments and biological substances status: Secondary | ICD-10-CM | POA: Insufficient documentation

## 2017-07-03 DIAGNOSIS — M545 Low back pain, unspecified: Secondary | ICD-10-CM

## 2017-07-03 DIAGNOSIS — M79601 Pain in right arm: Secondary | ICD-10-CM | POA: Insufficient documentation

## 2017-07-03 DIAGNOSIS — N816 Rectocele: Secondary | ICD-10-CM | POA: Insufficient documentation

## 2017-07-03 MED ORDER — MIDAZOLAM HCL 5 MG/5ML IJ SOLN
1.0000 mg | INTRAMUSCULAR | Status: DC | PRN
  Administered 2017-07-03: 2 mg via INTRAVENOUS
  Filled 2017-07-03: qty 5

## 2017-07-03 MED ORDER — ROPIVACAINE HCL 2 MG/ML IJ SOLN
9.0000 mL | Freq: Once | INTRAMUSCULAR | Status: AC
  Administered 2017-07-03: 9 mL via PERINEURAL
  Filled 2017-07-03: qty 10

## 2017-07-03 MED ORDER — LACTATED RINGERS IV SOLN
1000.0000 mL | Freq: Once | INTRAVENOUS | Status: AC
  Administered 2017-07-03: 1000 mL via INTRAVENOUS

## 2017-07-03 MED ORDER — FENTANYL CITRATE (PF) 100 MCG/2ML IJ SOLN
25.0000 ug | INTRAMUSCULAR | Status: DC | PRN
  Administered 2017-07-03: 50 ug via INTRAVENOUS
  Filled 2017-07-03: qty 2

## 2017-07-03 MED ORDER — LIDOCAINE HCL 2 % IJ SOLN
10.0000 mL | Freq: Once | INTRAMUSCULAR | Status: DC
  Filled 2017-07-03: qty 100

## 2017-07-03 MED ORDER — ROPIVACAINE HCL 2 MG/ML IJ SOLN
4.0000 mL | Freq: Once | INTRAMUSCULAR | Status: AC
Start: 2017-07-03 — End: 2017-07-03
  Administered 2017-07-03: 9 mL via INTRA_ARTICULAR
  Filled 2017-07-03: qty 10

## 2017-07-03 MED ORDER — TRIAMCINOLONE ACETONIDE 40 MG/ML IJ SUSP
40.0000 mg | Freq: Once | INTRAMUSCULAR | Status: AC
  Administered 2017-07-03: 40 mg
  Filled 2017-07-03: qty 1

## 2017-07-03 MED ORDER — METHYLPREDNISOLONE ACETATE 80 MG/ML IJ SUSP
80.0000 mg | Freq: Once | INTRAMUSCULAR | Status: AC
  Administered 2017-07-03: 80 mg via INTRA_ARTICULAR
  Filled 2017-07-03: qty 1

## 2017-07-03 NOTE — Progress Notes (Signed)
Patient's Name: [name]  MRN: [id number]  Referring Provider: Wenda Low, MD  DOB: 02-Jun-1940  PCP: Wenda Low, MD  DOS: 07/03/2017  Note by: Gaspar Cola, MD  Service setting: Ambulatory outpatient  Specialty: Interventional Pain Management  Patient type: Established  Location: [facility] (AMB) Pain Management Facility  Visit type: Interventional Procedure   Primary Reason for Visit: Interventional Pain Management Treatment. CC: Back Pain (lower right) and Arm Pain (right )  Procedure:  Anesthesia, Analgesia, Anxiolysis:  Procedure #1: Type: Diagnostic Medial Branch Facet Block #1  Region: Lumbar Level: L2, L3, L4, L5, & S1 Medial Branch Level(s) Laterality: Right  Procedure #2: Type: Diagnostic Sacroiliac Joint Block #1  Region: Posterior Lumbosacral Level: PSIS (Posterior Superior Iliac Spine) Sacroiliac Joint Laterality: Right  Type: Local Anesthesia with Moderate (Conscious) Sedation Local Anesthetic: Lidocaine 1% Route: Intravenous (IV) IV Access: Secured Sedation: Meaningful verbal contact was maintained at all times during the procedure  Indication(s): Analgesia and Anxiety   Indications: 1. 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 Lumbar facet arthropathy (Bilateral)   3. Chronic low back pain [facility] Area of Pain) (Bilateral) (L>R)   4. Chronic sacroiliac joint pain (Right)   5. Facet syndrome, lumbar   6. Lumbar facet arthropathy   7. Chronic bilateral low back pain without sciatica   8. Chronic right sacroiliac pain    Pain Score: Pre-procedure: 8 /10 Post-procedure: 0-No pain/10  Pre-op Assessment:  [name] is a 77 y.o. (year old), female patient, seen today for interventional treatment. She  has a past surgical history that includes Abdominal surgery; Colon surgery; Joint replacement; Appendectomy; Bladder surgery; Rectocele repair; Knee surgery; Cholecystectomy; Abdominal hysterectomy; Ostomy; Eye surgery; Intramedullary (im) nail  intertrochanteric (Right, 09/13/2016); and Hip surgery (Right, 08/2016). [name] has a current medication list which includes the following prescription(s): albuterol, albuterol, alprazolam, amlodipine, calcium-vitamin d-vitamin k, citalopram, diphenhydramine, docusate sodium, esomeprazole, lovastatin, meloxicam, omega-3 fish oil, ondansetron, orphenadrine, oxycodone, polyethylene glycol powder, promethazine, senna, trazodone, baclofen, diclofenac sodium, gabapentin, and hydrocodone-acetaminophen, and the following Facility-Administered Medications: fentanyl, lidocaine, and midazolam. Her primarily concern today is the Back Pain (lower right) and Arm Pain (right )  Initial Vital Signs:  Pulse Rate: 82 Temp: 98.2 F (36.8 C) Resp: 16 BP: (!) 157/79 SpO2: 97 %  BMI: Estimated body mass index is 30.95 kg/m as calculated from the following:   Height as of this encounter: 5\' 5"  (1.651 m).   Weight as of this encounter: 186 lb (84.4 kg).  Risk Assessment: Allergies: Reviewed. She is allergic to ampicillin-sulbactam sodium; ampicillin; toradol [ketorolac tromethamine]; tramadol; flexeril [cyclobenzaprine]; ambien [zolpidem tartrate]; cephalexin; ketorolac; percocet [oxycodone-acetaminophen]; sulfamethoxazole-trimethoprim; and tape.  Allergy Precautions: None required Coagulopathies: Reviewed. None identified.  Blood-thinner therapy: None at this time Active Infection(s): Reviewed. None identified. [name] is afebrile  Site Confirmation: [name] was asked to confirm the procedure and laterality before marking the site Procedure checklist: Completed Consent: Before the procedure and under the influence of no sedative(s), amnesic(s), or anxiolytics, the patient was informed of the treatment options, risks and possible complications. To fulfill our ethical and legal obligations, as recommended by the American Medical Association's Code of Ethics, I have informed the patient of my clinical  impression; the nature and purpose of the treatment or procedure; the risks, benefits, and possible complications of the intervention; the alternatives, including doing nothing; the risk(s) and benefit(s) of the alternative treatment(s) or procedure(s); and the risk(s) and benefit(s) of doing nothing. The patient was provided information  about the general risks and possible complications associated with the procedure. These may include, but are not limited to: failure to achieve desired goals, infection, bleeding, organ or nerve damage, allergic reactions, paralysis, and death. In addition, the patient was informed of those risks and complications associated to Spine-related procedures, such as failure to decrease pain; infection (i.e.: Meningitis, epidural or intraspinal abscess); bleeding (i.e.: epidural hematoma, subarachnoid hemorrhage, or any other type of intraspinal or peri-dural bleeding); organ or nerve damage (i.e.: Any type of peripheral nerve, nerve root, or spinal cord injury) with subsequent damage to sensory, motor, and/or autonomic systems, resulting in permanent pain, numbness, and/or weakness of one or several areas of the body; allergic reactions; (i.e.: anaphylactic reaction); and/or death. Furthermore, the patient was informed of those risks and complications associated with the medications. These include, but are not limited to: allergic reactions (i.e.: anaphylactic or anaphylactoid reaction(s)); adrenal axis suppression; blood sugar elevation that in diabetics may result in ketoacidosis or comma; water retention that in patients with history of congestive heart failure may result in shortness of breath, pulmonary edema, and decompensation with resultant heart failure; weight gain; swelling or edema; medication-induced neural toxicity; particulate matter embolism and blood vessel occlusion with resultant organ, and/or nervous system infarction; and/or aseptic necrosis of one or more  joints. Finally, the patient was informed that Medicine is not an exact science; therefore, there is also the possibility of unforeseen or unpredictable risks and/or possible complications that may result in a catastrophic outcome. The patient indicated having understood very clearly. We have given the patient no guarantees and we have made no promises. Enough time was given to the patient to ask questions, all of which were answered to the patient's satisfaction. [name] has indicated that she wanted to continue with the procedure. Attestation: I, the ordering provider, attest that I have discussed with the patient the benefits, risks, side-effects, alternatives, likelihood of achieving goals, and potential problems during recovery for the procedure that I have provided informed consent. Date  Time: 07/03/2017  8:22 AM  Pre-Procedure Preparation:  Monitoring: As per clinic protocol. Respiration, ETCO2, SpO2, BP, heart rate and rhythm monitor placed and checked for adequate function Safety Precautions: Patient was assessed for positional comfort and pressure points before starting the procedure. Time-out: I initiated and conducted the "Time-out" before starting the procedure, as per protocol. The patient was asked to participate by confirming the accuracy of the "Time Out" information. Verification of the correct person, site, and procedure were performed and confirmed by me, the nursing staff, and the patient. "Time-out" conducted as per Joint Commission's Universal Protocol (UP.01.01.01). "Time-out" Date & Time: 07/03/2017; 0936 hrs.  Description of Procedure #1 Process:   Time-out: "Time-out" completed before starting procedure, as per protocol. Position: Prone Target Area: For Lumbar Facet blocks, the target is the groove formed by the junction of the transverse process and superior articular process. For the L5 dorsal ramus, the target is the notch between superior articular process and sacral  ala. For the S1 dorsal ramus, the target is the superior and lateral edge of the posterior S1 Sacral foramen. Approach: Paramedial approach. Area Prepped: Entire Posterior Lumbosacral Region Prepping solution: ChloraPrep (2% chlorhexidine gluconate and 70% isopropyl alcohol) Safety Precautions: Aspiration looking for blood return was conducted prior to all injections. At no point did we inject any substances, as a needle was being advanced. No attempts were made at seeking any paresthesias. Safe injection practices and needle disposal techniques used. Medications properly checked for  expiration dates. SDV (single dose vial) medications used.  Description of the Procedure: Protocol guidelines were followed. The patient was placed in position over the fluoroscopy table. The target area was identified and the area prepped in the usual manner. Skin desensitized using vapocoolant spray. Skin & deeper tissues infiltrated with local anesthetic. Appropriate amount of time allowed to pass for local anesthetics to take effect. The procedure needle was introduced through the skin, ipsilateral to the reported pain, and advanced to the target area. Employing the "Medial Branch Technique", the needles were advanced to the angle made by the superior and medial portion of the transverse process, and the lateral and inferior portion of the superior articulating process of the targeted vertebral bodies. This area is known as "Burton's Eye" or the "Eye of the [place] Dog". A procedure needle was introduced through the skin, and this time advanced to the angle made by the superior and medial border of the sacral ala, and the lateral border of the S1 vertebral body. This last needle was later repositioned at the superior and lateral border of the posterior S1 foramen. Negative aspiration confirmed. Solution injected in intermittent fashion, asking for systemic symptoms every 0.5cc of injectate. The needles were then removed and  the area cleansed, making sure to leave some of the prepping solution back to take advantage of its long term bactericidal properties. Start Time: 0936 hrs. Materials:  Needle(s) Type: Regular needle Gauge: 22G Length: 3.5-in Medication(s): Please see orders for medications and dosing details.  Description of Procedure # 2 Process:   Position: Prone Target Area: For upper sacroiliac joint block(s), the target is the superior and posterior margin of the sacroiliac joint. Approach: Ipsilateral approach. Area Prepped: Entire Posterior Lumbosacral Region Prepping solution: ChloraPrep (2% chlorhexidine gluconate and 70% isopropyl alcohol) Safety Precautions: Aspiration looking for blood return was conducted prior to all injections. At no point did we inject any substances, as a needle was being advanced. No attempts were made at seeking any paresthesias. Safe injection practices and needle disposal techniques used. Medications properly checked for expiration dates. SDV (single dose vial) medications used. Description of the Procedure: Protocol guidelines were followed. The patient was placed in position over the fluoroscopy table. The target area was identified and the area prepped in the usual manner. Skin desensitized using vapocoolant spray. Skin & deeper tissues infiltrated with local anesthetic. Appropriate amount of time allowed to pass for local anesthetics to take effect. The procedure needle was advanced under fluoroscopic guidance into the sacroiliac joint until a firm endpoint was obtained. Proper needle placement secured. Negative aspiration confirmed. Solution injected in intermittent fashion, asking for systemic symptoms every 0.5cc of injectate. The needles were then removed and the area cleansed, making sure to leave some of the prepping solution back to take advantage of its long term bactericidal properties. Vitals:   07/03/17 0945 07/03/17 0955 07/03/17 1000 07/03/17 1012  BP: (!)  161/103 140/68 (!) 110/49   Pulse:      Resp: (!) 23 (!) 21 (!) 26 (!) 24  Temp:  98.2 F (36.8 C)    TempSrc:  Temporal    SpO2: 95% 95% 97% 95%  Weight:      Height:        End Time: 0945 hrs. Materials:  Needle(s) Type: Regular needle Gauge: 22G Length: 3.5-in Medication(s): Please see orders for medications and dosing details.  Imaging Guidance (Spinal):  Type of Imaging Technique: Fluoroscopy Guidance (Spinal) Indication(s): Assistance in needle guidance and placement for  procedures requiring needle placement in or near specific anatomical locations not easily accessible without such assistance. Exposure Time: Please see nurses notes. Contrast: None used. Fluoroscopic Guidance: I was personally present during the use of fluoroscopy. "Tunnel Vision Technique" used to obtain the best possible view of the target area. Parallax error corrected before commencing the procedure. "Direction-depth-direction" technique used to introduce the needle under continuous pulsed fluoroscopy. Once target was reached, antero-posterior, oblique, and lateral fluoroscopic projection used confirm needle placement in all planes. Images permanently stored in EMR. Interpretation: No contrast injected. I personally interpreted the imaging intraoperatively. Adequate needle placement confirmed in multiple planes. Permanent images saved into the patient's record.  Antibiotic Prophylaxis:   Antibiotics Given (last 72 hours)    None     Indication(s): None identified  Post-operative Assessment:  Post-procedure Vital Signs:  Pulse Rate: 82 Temp: 98.2 F (36.8 C) Resp: (!) 24 BP: (!) 110/49 SpO2: 95 %  EBL: None  Complications: No immediate post-treatment complications observed by team, or reported by patient.  Note: The patient tolerated the entire procedure well. A repeat set of vitals were taken after the procedure and the patient was kept under observation following institutional policy, for this  type of procedure. Post-procedural neurological assessment was performed, showing return to baseline, prior to discharge. The patient was provided with post-procedure discharge instructions, including a section on how to identify potential problems. Should any problems arise concerning this procedure, the patient was given instructions to immediately contact us, at any time, without hesitation. In any case, we plan to contact the patient by telephone for a follow-up status report regarding this interventional procedure.  Comments:  No additional relevant information.  Plan of Care    Imaging Orders     DG C-Arm 1-60 Min-No Report  Procedure Orders     LUMBAR FACET(MEDIAL BRANCH NERVE BLOCK) MBNB     SACROILIAC JOINT INJECTION  Medications ordered for procedure: Meds ordered this encounter  Medications  . midazolam (VERSED) 5 MG/5ML injection 1-2 mg    Make sure Flumazenil is available in the pyxis when using this medication. If oversedation occurs, administer 0.2 mg IV over 15 sec. If after 45 sec no response, administer 0.2 mg again over 1 min; may repeat at 1 min intervals; not to exceed 4 doses (1 mg)  . fentaNYL (SUBLIMAZE) injection 25-50 mcg    Make sure Narcan is available in the pyxis when using this medication. In the event of respiratory depression (RR< 8/min): Titrate NARCAN (naloxone) in increments of 0.1 to 0.2 mg IV at 2-3 minute intervals, until desired degree of reversal.  . lactated ringers infusion 1,000 mL  . lidocaine (XYLOCAINE) 2 % (with pres) injection 200 mg  . ropivacaine (PF) 2 mg/mL (0.2%) (NAROPIN) injection 9 mL  . triamcinolone acetonide (KENALOG-40) injection 40 mg  . ropivacaine (PF) 2 mg/mL (0.2%) (NAROPIN) injection 4 mL  . methylPREDNISolone acetate (DEPO-MEDROL) injection 80 mg   Medications administered: We administered midazolam, fentaNYL, lactated ringers, ropivacaine (PF) 2 mg/mL (0.2%), triamcinolone acetonide, ropivacaine (PF) 2 mg/mL (0.2%),  and methylPREDNISolone acetate.  See the medical record for exact dosing, route, and time of administration.  New Prescriptions   No medications on file   Disposition: Discharge home  Discharge Date & Time: 07/03/2017; 1017 hrs.   Physician-requested Follow-up: Return for post-procedure eval (2 wks), w/ Dr. Dossie Arbour.  Future Appointments  Date Time Provider [place]  07/21/2017 10:15 AM Milinda Pointer, MD [facility] None   Primary Care Physician:  Wenda Low, MD Location: [facility] Outpatient Pain Management Facility Note by: Gaspar Cola, MD Date: 07/03/2017; Time: 10:40 AM  Disclaimer:  Medicine is not an Chief Strategy Officer. The only guarantee in medicine is that nothing is guaranteed. It is important to note that the decision to proceed with this intervention was based on the information collected from the patient. The Data and conclusions were drawn from the patient's questionnaire, the interview, and the physical examination. Because the information was provided in large part by the patient, it cannot be guaranteed that it has not been purposely or unconsciously manipulated. Every effort has been made to obtain as much relevant data as possible for this evaluation. It is important to note that the conclusions that lead to this procedure are derived in large part from the available data. Always take into account that the treatment will also be dependent on availability of resources and existing treatment guidelines, considered by other Pain Management Practitioners as being common knowledge and practice, at the time of the intervention. For Medico-Legal purposes, it is also important to point out that variation in procedural techniques and pharmacological choices are the acceptable norm. The indications, contraindications, technique, and results of the above procedure should only be interpreted and judged by a Board-Certified Interventional Pain Specialist with extensive familiarity  and expertise in the same exact procedure and technique.

## 2017-07-03 NOTE — Patient Instructions (Signed)

## 2017-07-04 ENCOUNTER — Telehealth: Payer: Self-pay

## 2017-07-04 NOTE — Telephone Encounter (Signed)
Post procedure phone call.  Left message.  

## 2017-07-07 ENCOUNTER — Ambulatory Visit: Payer: Medicare Other | Admitting: Physical Therapy

## 2017-07-08 ENCOUNTER — Telehealth: Payer: Self-pay | Admitting: Pain Medicine

## 2017-07-08 NOTE — Telephone Encounter (Signed)
Daughter [name] called stating patient is in a lot of pain, had procedure on 07-03-17. Needs something done for pain. Please call to discuss

## 2017-07-09 NOTE — Telephone Encounter (Signed)
Spoke with Daughter re; patients pain and past procedure as well as history.  Offered patient appt with crystal to be seen for pain.  Patient will come tomorrow for appt with Crystal at 0930 07/10/17.

## 2017-07-09 NOTE — Telephone Encounter (Signed)
Attempted to call patient's [name], message left.

## 2017-07-10 ENCOUNTER — Ambulatory Visit: Payer: Medicare Other | Attending: Nurse Practitioner | Admitting: Nurse Practitioner

## 2017-07-10 ENCOUNTER — Other Ambulatory Visit: Payer: Self-pay

## 2017-07-10 ENCOUNTER — Encounter: Payer: Self-pay | Admitting: Nurse Practitioner

## 2017-07-10 VITALS — BP 126/69 | HR 55 | Temp 98.0°F | Resp 18 | Ht 65.0 in | Wt 185.0 lb

## 2017-07-10 DIAGNOSIS — M7918 Myalgia, other site: Secondary | ICD-10-CM | POA: Diagnosis not present

## 2017-07-10 DIAGNOSIS — M533 Sacrococcygeal disorders, not elsewhere classified: Secondary | ICD-10-CM | POA: Diagnosis not present

## 2017-07-10 DIAGNOSIS — N183 Chronic kidney disease, stage 3 (moderate): Secondary | ICD-10-CM | POA: Insufficient documentation

## 2017-07-10 DIAGNOSIS — G8929 Other chronic pain: Secondary | ICD-10-CM | POA: Diagnosis not present

## 2017-07-10 DIAGNOSIS — Z79899 Other long term (current) drug therapy: Secondary | ICD-10-CM | POA: Insufficient documentation

## 2017-07-10 DIAGNOSIS — F419 Anxiety disorder, unspecified: Secondary | ICD-10-CM | POA: Insufficient documentation

## 2017-07-10 DIAGNOSIS — Z96651 Presence of right artificial knee joint: Secondary | ICD-10-CM | POA: Insufficient documentation

## 2017-07-10 DIAGNOSIS — M25562 Pain in left knee: Secondary | ICD-10-CM | POA: Insufficient documentation

## 2017-07-10 DIAGNOSIS — M79604 Pain in right leg: Secondary | ICD-10-CM | POA: Diagnosis not present

## 2017-07-10 DIAGNOSIS — Z9049 Acquired absence of other specified parts of digestive tract: Secondary | ICD-10-CM | POA: Diagnosis not present

## 2017-07-10 DIAGNOSIS — M1288 Other specific arthropathies, not elsewhere classified, other specified site: Secondary | ICD-10-CM | POA: Diagnosis not present

## 2017-07-10 DIAGNOSIS — M25511 Pain in right shoulder: Secondary | ICD-10-CM | POA: Insufficient documentation

## 2017-07-10 DIAGNOSIS — Z791 Long term (current) use of non-steroidal anti-inflammatories (NSAID): Secondary | ICD-10-CM | POA: Diagnosis not present

## 2017-07-10 DIAGNOSIS — M545 Low back pain, unspecified: Secondary | ICD-10-CM

## 2017-07-10 DIAGNOSIS — G894 Chronic pain syndrome: Secondary | ICD-10-CM | POA: Diagnosis present

## 2017-07-10 DIAGNOSIS — E785 Hyperlipidemia, unspecified: Secondary | ICD-10-CM | POA: Diagnosis not present

## 2017-07-10 DIAGNOSIS — M47816 Spondylosis without myelopathy or radiculopathy, lumbar region: Secondary | ICD-10-CM | POA: Diagnosis not present

## 2017-07-10 DIAGNOSIS — M25512 Pain in left shoulder: Secondary | ICD-10-CM | POA: Diagnosis not present

## 2017-07-10 DIAGNOSIS — Z79891 Long term (current) use of opiate analgesic: Secondary | ICD-10-CM | POA: Insufficient documentation

## 2017-07-10 DIAGNOSIS — M5136 Other intervertebral disc degeneration, lumbar region: Secondary | ICD-10-CM | POA: Diagnosis not present

## 2017-07-10 DIAGNOSIS — M25551 Pain in right hip: Secondary | ICD-10-CM | POA: Diagnosis not present

## 2017-07-10 DIAGNOSIS — I129 Hypertensive chronic kidney disease with stage 1 through stage 4 chronic kidney disease, or unspecified chronic kidney disease: Secondary | ICD-10-CM | POA: Insufficient documentation

## 2017-07-10 DIAGNOSIS — K219 Gastro-esophageal reflux disease without esophagitis: Secondary | ICD-10-CM | POA: Diagnosis not present

## 2017-07-10 DIAGNOSIS — Z87891 Personal history of nicotine dependence: Secondary | ICD-10-CM | POA: Diagnosis not present

## 2017-07-10 DIAGNOSIS — M25561 Pain in right knee: Secondary | ICD-10-CM | POA: Diagnosis not present

## 2017-07-10 NOTE — Patient Instructions (Addendum)
____________________________________________________________________________________________  Pain Scale  Introduction: The pain score used by this practice is the Verbal Numerical Rating Scale (VNRS-11). This is an 11-point scale. It is for adults and children 10 years or older. There are significant differences in how the pain score is reported, used, and applied. Forget everything you learned in the past and learn this scoring system.  General Information: The scale should reflect your current level of pain. Unless you are specifically asked for the level of your worst pain, or your average pain. If you are asked for one of these two, then it should be understood that it is over the past 24 hours.  Basic Activities of Daily Living (ADL): Personal hygiene, dressing, eating, transferring, and using restroom.  Instructions: Most patients tend to report their level of pain as a combination of two factors, their physical pain and their psychosocial pain. This last one is also known as "suffering" and it is reflection of how physical pain affects you socially and psychologically. From now on, report them separately. From this point on, when asked to report your pain level, report only your physical pain. Use the following table for reference.  Pain Clinic Pain Levels (0-5/10)  Pain Level Score  Description  No Pain 0   Mild pain 1 Nagging, annoying, but does not interfere with basic activities of daily living (ADL). Patients are able to eat, bathe, get dressed, toileting (being able to get on and off the toilet and perform personal hygiene functions), transfer (move in and out of bed or a chair without assistance), and maintain continence (able to control bladder and bowel functions). Blood pressure and heart rate are unaffected. A normal heart rate for a healthy adult ranges from 60 to 100 bpm (beats per minute).   Mild to moderate pain 2 Noticeable and distracting. Impossible to hide from other  people. More frequent flare-ups. Still possible to adapt and function close to normal. It can be very annoying and may have occasional stronger flare-ups. With discipline, patients may get used to it and adapt.   Moderate pain 3 Interferes significantly with activities of daily living (ADL). It becomes difficult to feed, bathe, get dressed, get on and off the toilet or to perform personal hygiene functions. Difficult to get in and out of bed or a chair without assistance. Very distracting. With effort, it can be ignored when deeply involved in activities.   Moderately severe pain 4 Impossible to ignore for more than a few minutes. With effort, patients may still be able to manage work or participate in some social activities. Very difficult to concentrate. Signs of autonomic nervous system discharge are evident: dilated pupils (mydriasis); mild sweating (diaphoresis); sleep interference. Heart rate becomes elevated (>115 bpm). Diastolic blood pressure (lower number) rises above 100 mmHg. Patients find relief in laying down and not moving.   Severe pain 5 Intense and extremely unpleasant. Associated with frowning face and frequent crying. Pain overwhelms the senses.  Ability to do any activity or maintain social relationships becomes significantly limited. Conversation becomes difficult. Pacing back and forth is common, as getting into a comfortable position is nearly impossible. Pain wakes you up from deep sleep. Physical signs will be obvious: pupillary dilation; increased sweating; goosebumps; brisk reflexes; cold, clammy hands and feet; nausea, vomiting or dry heaves; loss of appetite; significant sleep disturbance with inability to fall asleep or to remain asleep. When persistent, significant weight loss is observed due to the complete loss of appetite and sleep deprivation.  Blood   pressure and heart rate becomes significantly elevated. Caution: If elevated blood pressure triggers a pounding headache  associated with blurred vision, then the patient should immediately seek attention at an urgent or emergency care unit, as these may be signs of an impending stroke.    Emergency Department Pain Levels (6-10/10)  Emergency Room Pain 6 Severely limiting. Requires emergency care and should not be seen or managed at an outpatient pain management facility. Communication becomes difficult and requires great effort. Assistance to reach the emergency department may be required. Facial flushing and profuse sweating along with potentially dangerous increases in heart rate and blood pressure will be evident.   Distressing pain 7 Self-care is very difficult. Assistance is required to transport, or use restroom. Assistance to reach the emergency department will be required. Tasks requiring coordination, such as bathing and getting dressed become very difficult.   Disabling pain 8 Self-care is no longer possible. At this level, pain is disabling. The individual is unable to do even the most "basic" activities such as walking, eating, bathing, dressing, transferring to a bed, or toileting. Fine motor skills are lost. It is difficult to think clearly.   Incapacitating pain 9 Pain becomes incapacitating. Thought processing is no longer possible. Difficult to remember your own name. Control of movement and coordination are lost.   The worst pain imaginable 10 At this level, most patients pass out from pain. When this level is reached, collapse of the autonomic nervous system occurs, leading to a sudden drop in blood pressure and heart rate. This in turn results in a temporary and dramatic drop in blood flow to the brain, leading to a loss of consciousness. Fainting is one of the body's self defense mechanisms. Passing out puts the brain in a calmed state and causes it to shut down for a while, in order to begin the healing process.    Summary: 1. Refer to this scale when providing [place] with your pain level. 2. Be  accurate and careful when reporting your pain level. This will help with your care. 3. Over-reporting your pain level will lead to loss of credibility. 4. Even a level of 1/10 means that there is pain and will be treated at our facility. 5. High, inaccurate reporting will be documented as "Symptom Exaggeration", leading to loss of credibility and suspicions of possible secondary gains such as obtaining more narcotics, or wanting to appear disabled, for fraudulent reasons. 6. Only pain levels of 5 or below will be seen at our facility. 7. Pain levels of 6 and above will be sent to the Emergency Department and the appointment cancelled. ____________________________________________________________________________________________   GENERAL RISKS AND COMPLICATIONS  What are the risk, side effects and possible complications? Generally speaking, most procedures are safe.  However, with any procedure there are risks, side effects, and the possibility of complications.  The risks and complications are dependent upon the sites that are lesioned, or the type of nerve block to be performed.  The closer the procedure is to the spine, the more serious the risks are.  Great care is taken when placing the radio frequency needles, block needles or lesioning probes, but sometimes complications can occur. 1. Infection: Any time there is an injection through the skin, there is a risk of infection.  This is why sterile conditions are used for these blocks.  There are four possible types of infection. 1. Localized skin infection. 2. Central Nervous System Infection-This can be in the form of Meningitis, which can be deadly.  3. Epidural Infections-This can be in the form of an epidural abscess, which can cause pressure inside of the spine, causing compression of the spinal cord with subsequent paralysis. This would require an emergency surgery to decompress, and there are no guarantees that the patient would recover from  the paralysis. 4. Discitis-This is an infection of the intervertebral discs.  It occurs in about 1% of discography procedures.  It is difficult to treat and it may lead to surgery.        2. Pain: the needles have to go through skin and soft tissues, will cause soreness.       3. Damage to internal structures:  The nerves to be lesioned may be near blood vessels or    other nerves which can be potentially damaged.       4. Bleeding: Bleeding is more common if the patient is taking blood thinners such as  aspirin, Coumadin, Ticiid, Plavix, etc., or if he/she have some genetic predisposition  such as hemophilia. Bleeding into the spinal canal can cause compression of the spinal  cord with subsequent paralysis.  This would require an emergency surgery to  decompress and there are no guarantees that the patient would recover from the  paralysis.       5. Pneumothorax:  Puncturing of a lung is a possibility, every time a needle is introduced in  the area of the chest or upper back.  Pneumothorax refers to free air around the  collapsed lung(s), inside of the thoracic cavity (chest cavity).  Another two possible  complications related to a similar event would include: Hemothorax and Chylothorax.   These are variations of the Pneumothorax, where instead of air around the collapsed  lung(s), you may have blood or chyle, respectively.       6. Spinal headaches: They may occur with any procedures in the area of the spine.       7. Persistent CSF (Cerebro-Spinal Fluid) leakage: This is a rare problem, but may occur  with prolonged intrathecal or epidural catheters either due to the formation of a fistulous  track or a dural tear.       8. Nerve damage: By working so close to the spinal cord, there is always a possibility of  nerve damage, which could be as serious as a permanent spinal cord injury with  paralysis.       9. Death:  Although rare, severe deadly allergic reactions known as "Anaphylactic  reaction" can  occur to any of the medications used.      10. Worsening of the symptoms:  We can always make thing worse.  What are the chances of something like this happening? Chances of any of this occuring are extremely low.  By statistics, you have more of a chance of getting killed in a motor vehicle accident: while driving to the hospital than any of the above occurring .  Nevertheless, you should be aware that they are possibilities.  In general, it is similar to taking a shower.  Everybody knows that you can slip, hit your head and get killed.  Does that mean that you should not shower again?  Nevertheless always keep in mind that statistics do not mean anything if you happen to be on the wrong side of them.  Even if a procedure has a 1 (one) in a 1,000,000 (million) chance of going wrong, it you happen to be that one..Also, keep in mind that by statistics, you have more of  a chance of having something go wrong when taking medications.  Who should not have this procedure? If you are on a blood thinning medication (e.g. Coumadin, Plavix, see list of "Blood Thinners"), or if you have an active infection going on, you should not have the procedure.  If you are taking any blood thinners, please inform your physician.  How should I prepare for this procedure?  Do not eat or drink anything at least six hours prior to the procedure.  Bring a driver with you .  It cannot be a taxi.  Come accompanied by an adult that can drive you back, and that is strong enough to help you if your legs get weak or numb from the local anesthetic.  Take all of your medicines the morning of the procedure with just enough water to swallow them.  If you have diabetes, make sure that you are scheduled to have your procedure done first thing in the morning, whenever possible.  If you have diabetes, take only half of your insulin dose and notify our nurse that you have done so as soon as you arrive at the clinic.  If you are  diabetic, but only take blood sugar pills (oral hypoglycemic), then do not take them on the morning of your procedure.  You may take them after you have had the procedure.  Do not take aspirin or any aspirin-containing medications, at least eleven (11) days prior to the procedure.  They may prolong bleeding.  Wear loose fitting clothing that may be easy to take off and that you would not mind if it got stained with Betadine or blood.  Do not wear any jewelry or perfume  Remove any nail coloring.  It will interfere with some of our monitoring equipment.  NOTE: Remember that this is not meant to be interpreted as a complete list of all possible complications.  Unforeseen problems may occur.  BLOOD THINNERS The following drugs contain aspirin or other products, which can cause increased bleeding during surgery and should not be taken for 2 weeks prior to and 1 week after surgery.  If you should need take something for relief of minor pain, you may take acetaminophen which is found in Tylenol,m Datril, Anacin-3 and Panadol. It is not blood thinner. The products listed below are.  Do not take any of the products listed below in addition to any listed on your instruction sheet.  A.P.C or A.P.C with Codeine Codeine Phosphate Capsules #3 Ibuprofen Ridaura  ABC compound Congesprin Imuran rimadil  Advil Cope Indocin Robaxisal  Alka-Seltzer Effervescent Pain Reliever and Antacid Coricidin or Coricidin-D  Indomethacin Rufen  Alka-Seltzer plus Cold Medicine Cosprin Ketoprofen S-A-C Tablets  Anacin Analgesic Tablets or Capsules Coumadin Korlgesic Salflex  Anacin Extra Strength Analgesic tablets or capsules CP-2 Tablets Lanoril Salicylate  Anaprox Cuprimine Capsules Levenox Salocol  Anexsia-D Dalteparin Magan Salsalate  Anodynos Darvon compound Magnesium Salicylate Sine-off  Ansaid Dasin Capsules Magsal Sodium Salicylate  Anturane Depen Capsules Marnal Soma  APF Arthritis pain formula Dewitt's Pills  Measurin Stanback  Argesic Dia-Gesic Meclofenamic Sulfinpyrazone  Arthritis Bayer Timed Release Aspirin Diclofenac Meclomen Sulindac  Arthritis pain formula Anacin Dicumarol Medipren Supac  Analgesic (Safety coated) Arthralgen Diffunasal Mefanamic Suprofen  Arthritis Strength Bufferin Dihydrocodeine Mepro Compound Suprol  Arthropan liquid Dopirydamole Methcarbomol with Aspirin Synalgos  ASA tablets/Enseals Disalcid Micrainin Tagament  Ascriptin Doan's Midol Talwin  Ascriptin A/D Dolene Mobidin Tanderil  Ascriptin Extra Strength Dolobid Moblgesic Ticlid  Ascriptin with Codeine Doloprin or Doloprin with  Codeine Momentum Tolectin  Asperbuf Duoprin Mono-gesic Trendar  Aspergum Duradyne Motrin or Motrin IB Triminicin  Aspirin plain, buffered or enteric coated Durasal Myochrisine Trigesic  Aspirin Suppositories Easprin Nalfon Trillsate  Aspirin with Codeine Ecotrin Regular or Extra Strength Naprosyn Uracel  Atromid-S Efficin Naproxen Ursinus  Auranofin Capsules Elmiron Neocylate Vanquish  Axotal Emagrin Norgesic Verin  Azathioprine Empirin or Empirin with Codeine Normiflo Vitamin E  Azolid Emprazil Nuprin Voltaren  Bayer Aspirin plain, buffered or children's or timed BC Tablets or powders Encaprin Orgaran Warfarin Sodium  Buff-a-Comp Enoxaparin Orudis Zorpin  Buff-a-Comp with Codeine Equegesic Os-Cal-Gesic   Buffaprin Excedrin plain, buffered or Extra Strength Oxalid   Bufferin Arthritis Strength Feldene Oxphenbutazone   Bufferin plain or Extra Strength Feldene Capsules Oxycodone with Aspirin   Bufferin with Codeine Fenoprofen Fenoprofen Pabalate or Pabalate-SF   Buffets II Flogesic Panagesic   Buffinol plain or Extra Strength Florinal or Florinal with Codeine Panwarfarin   Buf-Tabs Flurbiprofen Penicillamine   Butalbital Compound Four-way cold tablets Penicillin   Butazolidin Fragmin Pepto-Bismol   Carbenicillin Geminisyn Percodan   Carna Arthritis Reliever Geopen Persantine    Carprofen Gold's salt Persistin   Chloramphenicol Goody's Phenylbutazone   Chloromycetin Haltrain Piroxlcam   Clmetidine heparin Plaquenil   Cllnoril Hyco-pap Ponstel   Clofibrate Hydroxy chloroquine Propoxyphen         Before stopping any of these medications, be sure to consult the physician who ordered them.  Some, such as Coumadin (Warfarin) are ordered to prevent or treat serious conditions such as "deep thrombosis", "pumonary embolisms", and other heart problems.  The amount of time that you may need off of the medication may also vary with the medication and the reason for which you were taking it.  If you are taking any of these medications, please make sure you notify your pain physician before you undergo any procedures.         Sacroiliac (SI) Joint Injection Patient Information  Description: The sacroiliac joint connects the scrum (very low back and tailbone) to the ilium (a pelvic bone which also forms half of the hip joint).  Normally this joint experiences very little motion.  When this joint becomes inflamed or unstable low back and or hip and pelvis pain may result.  Injection of this joint with local anesthetics (numbing medicines) and steroids can provide diagnostic information and reduce pain.  This injection is performed with the aid of x-ray guidance into the tailbone area while you are lying on your stomach.   You may experience an electrical sensation down the leg while this is being done.  You may also experience numbness.  We also may ask if we are reproducing your normal pain during the injection.  Conditions which may be treated SI injection:   Low back, buttock, hip or leg pain  Preparation for the Injection:  1. Do not eat any solid food or dairy products within 8 hours of your appointment.  2. You may drink clear liquids up to 3 hours before appointment.  Clear liquids include water, black coffee, juice or soda.  No milk or cream please. 3. You may  take your regular medications, including pain medications with a sip of water before your appointment.  Diabetics should hold regular insulin (if take separately) and take 1/2 normal NPH dose the morning of the procedure.  Carry some sugar containing items with you to your appointment. 4. A driver must accompany you and be prepared to drive you home after your procedure. 5.  Bring all of your current medications with you. 6. An IV may be inserted and sedation may be given at the discretion of the physician. 7. A blood pressure cuff, EKG and other monitors will often be applied during the procedure.  Some patients may need to have extra oxygen administered for a short period.  8. You will be asked to provide medical information, including your allergies, prior to the procedure.  We must know immediately if you are taking blood thinners (like Coumadin/Warfarin) or if you are allergic to IV iodine contrast (dye).  We must know if you could possible be pregnant.  Possible side effects:   Bleeding from needle site  Infection (rare, may require surgery)  Nerve injury (rare)  Numbness & tingling (temporary)  A brief convulsion or seizure  Light-headedness (temporary)  Pain at injection site (several days)  Decreased blood pressure (temporary)  Weakness in the leg (temporary)   Call if you experience:   New onset weakness or numbness of an extremity below the injection site that last more than 8 hours.  Hives or difficulty breathing ( go to the emergency room)  Inflammation or drainage at the injection site  Any new symptoms which are concerning to you  Please note:  Although the local anesthetic injected can often make your back/ hip/ buttock/ leg feel good for several hours after the injections, the pain will likely return.  It takes 3-7 days for steroids to work in the sacroiliac area.  You may not notice any pain relief for at least that one week.  If effective, we will often  do a series of three injections spaced 3-6 weeks apart to maximally decrease your pain.  After the initial series, we generally will wait some months before a repeat injection of the same type.  If you have any questions, please call [phone] [facility]   Facet Joint Block The facet joints connect the bones of the spine (vertebrae). They make it possible for you to bend, twist, and make other movements with your spine. They also keep you from bending too far, twisting too far, and making other excessive movements. A facet joint block is a procedure where a numbing medicine (anesthetic) is injected into a facet joint. Often, a type of anti-inflammatory medicine called a steroid is also injected. A facet joint block may be done to diagnose neck or back pain. If the pain gets better after a facet joint block, it means the pain is probably coming from the facet joint. If the pain does not get better, it means the pain is probably not coming from the facet joint. A facet joint block may also be done to relieve neck or back pain caused by an inflamed facet joint. A facet joint block is only done to relieve pain if the pain does not improve with other methods, such as medicine, exercise programs, and physical therapy. Tell a health care provider about:  Any allergies you have.  All medicines you are taking, including vitamins, herbs, eye drops, creams, and over-the-counter medicines.  Any problems you or family members have had with anesthetic medicines.  Any blood disorders you have.  Any surgeries you have had.  Any medical conditions you have.  Whether you are pregnant or may be pregnant. What are the risks? Generally, this is a safe procedure. However, problems may occur, including:  Bleeding.  Injury to a nerve near the injection site.  Pain at the injection site.  Weakness or numbness in areas controlled by nerves near the injection  site.  Infection.  Temporary fluid retention.  Allergic reactions to medicines or dyes.  Injury to other structures or organs near the injection site.  What happens before the procedure?  Follow instructions from your health care provider about eating or drinking restrictions.  Ask your health care provider about: ? Changing or stopping your regular medicines. This is especially important if you are taking diabetes medicines or blood thinners. ? Taking medicines such as aspirin and ibuprofen. These medicines can thin your blood. Do not take these medicines before your procedure if your health care provider instructs you not to.  Do not take any new dietary supplements or medicines without asking your health care provider first.  Plan to have someone take you home after the procedure. What happens during the procedure?  You may need to remove your clothing and dress in an open-back gown.  The procedure will be done while you are lying on an X-ray table. You will most likely be asked to lie on your stomach, but you may be asked to lie in a different position if an injection will be made in your neck.  Machines will be used to monitor your oxygen levels, heart rate, and blood pressure.  If an injection will be made in your neck, an IV tube will be inserted into one of your veins. Fluids and medicine will flow directly into your body through the IV tube.  The area over the facet joint where the injection will be made will be cleaned with soap. The surrounding skin will be covered with clean drapes.  A numbing medicine (local anesthetic) will be applied to your skin. Your skin may sting or burn for a moment.  A video X-ray machine (fluoroscopy) will be used to locate the joint. In some cases, a CT scan may be used.  A contrast dye may be injected into the facet joint area to help locate the joint.  When the joint is located, an anesthetic will be injected into the joint through the  needle.  Your health care provider will ask you whether you feel pain relief. If you do feel relief, a steroid may be injected to provide pain relief for a longer period of time. If you do not feel relief or feel only partial relief, additional injections of an anesthetic may be made in other facet joints.  The needle will be removed.  Your skin will be cleaned.  A bandage (dressing) will be applied over each injection site. The procedure may vary among health care providers and hospitals. What happens after the procedure?  You will be observed for 15-30 minutes before being allowed to go home. This information is not intended to replace advice given to you by your health care provider. Make sure you discuss any questions you have with your health care provider. Document Released: 10/02/2006 Document Revised: 06/14/2015 Document Reviewed: 02/06/2015 [employer] Interactive Patient Education  2018 [place].  BMI Assessment: Estimated body mass index is 30.79 kg/m as calculated from the following:   Height as of this encounter: 5\' 5"  (1.651 m).   Weight as of this encounter: 185 lb (83.9 kg).  BMI interpretation table: BMI level Category Range association with higher incidence of chronic pain  <18 kg/m2 Underweight   18.5-24.9 kg/m2 Ideal body weight   25-29.9 kg/m2 Overweight Increased incidence by 20%  30-34.9 kg/m2 Obese (Class I) Increased incidence by 68%  35-39.9 kg/m2 Severe  obesity (Class II) Increased incidence by 136%  >40 kg/m2 Extreme obesity (Class III) Increased incidence by 254%   BMI Readings from Last 4 Encounters:  07/10/17 30.79 kg/m  07/03/17 30.95 kg/m  07/02/17 30.95 kg/m  06/27/17 30.79 kg/m   Wt Readings from Last 4 Encounters:  07/10/17 185 lb (83.9 kg)  07/03/17 186 lb (84.4 kg)  07/02/17 186 lb (84.4 kg)  06/27/17 185 lb (83.9 kg)

## 2017-07-10 NOTE — Progress Notes (Signed)
Safety precautions to be maintained throughout the outpatient stay will include: orient to surroundings, keep bed in low position, maintain call bell within reach at all times, provide assistance with transfer out of bed and ambulation.  

## 2017-07-10 NOTE — Progress Notes (Signed)
Patient's Name: [name]  MRN: [id number]  Referring Provider: Wenda Low, MD  DOB: [birth year]  PCP: Wenda Low, MD  DOS: 07/10/2017  Note by: Vevelyn Francois NP  Service setting: Ambulatory outpatient  Specialty: Interventional Pain Management  Location: [facility] (AMB) Pain Management Facility    Patient type: Established    Primary Reason(s) for Visit: Evaluation of chronic illnesses with exacerbation, or progression (Level of risk: moderate) CC: Back Pain (low); Hip Pain (right); and Leg Pain (right lateral to knee)  HPI  [name] is a 77 y.o. year old, female patient, who comes today for a follow-up evaluation. 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[place]); Hypertensive kidney disease, malignant; Chronic low back pain (Tertiary Area of Pain) (Bilateral) (L>R); CAFL (chronic airflow limitation) ([place]); Apoplectic vertigo; Duodenogastric reflux; History of stroke; Hoarseness; Benign essential HTN; Chronic knee pain (Primary Area of Pain) (Right); Depression, major, in partial remission ([place]); Major depression in remission ([place]); Involutional osteoporosis; Pharyngoesophageal dysphagia; 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); Hx of total knee replacement (Bilateral); Infection of the upper respiratory tract; Bed wetting; UTI (urinary tract infection); Long term current use of opiate analgesic; Long term prescription opiate use; Chronic pain syndrome; Chronic neck pain (Secondary Area of Pain) (Bilateral) (L>R); Chronic hip pain (Right); Rib pain; DDD (degenerative disc disease),  cervical; Lumbar facet arthropathy (Bilateral); Chronic myofascial pain; Lumbar facet syndrome (Bilateral) (L>R); Long term prescription benzodiazepine use; Opiate use; Disorder of skeletal system; Pharmacologic therapy; Problems influencing health status; Opioid-induced constipation (OIC); NSAID long-term use; DDD (degenerative disc disease), lumbar; Chronic knee pain (Bilateral) (R>L); Osteoarthritis; Chronic musculoskeletal pain; Neurogenic pain; Chronic knee pain s/p total knee replacement (TKR) (Right); CKD (chronic kidney disease) stage 3, GFR 30-59 ml/min ([place]); Chronic shoulder pain (Left); Non-traumatic compression fracture of T3 thoracic vertebra, sequela; High risk medication use; At high risk for falls; and Chronic sacroiliac joint pain (Right) on their problem list. [name] was last seen on Visit date not found. Her primarily concern today is the Back Pain (low); Hip Pain (right); and Leg Pain (right lateral to knee)  Pain Assessment: Location: Lower Back Radiating: right hip Onset: More than a month ago Duration: Chronic pain Quality: Shooting, Stabbing, Constant, Dull, Aching, Discomfort Severity: 2 /10 (self-reported pain score)  Note: Reported level is compatible with observation.                          Timing: Constant Modifying factors: medications  Further details on both, my assessment(s), as well as the proposed treatment plan, please see below. She is in today with her daughter.  Her daughter states that she has been having pain for greater than one year. She states that her pain is worse on the right. She states that her previous injection was only effective for 3 days. She states that "she is not able to do anything". She has "no quality of life". She tries to exercises and walk even with the pain. She states she has passed out in the past from pain. She has not followed up with the surgeon secondary to being told "{it was all in her head".  Post-Procedure Assessment    07/03/17 Procedure: right-sided lumbar  facet block + right-sided sacroiliac joint block Pre-procedure pain score:  8/10 Post-procedure pain score: 0/10         Influential Factors: BMI: 30.79 kg/m Intra-procedural challenges: None observed.         Assessment challenges: None detected.              Reported side-effects: None.        Post-procedural adverse reactions or complications: None reported         Sedation: Please see nurses note. When no sedatives are used, the analgesic levels obtained are directly associated to the effectiveness of the local anesthetics. However, when sedation is provided, the level of analgesia obtained during the initial 1 hour following the intervention, is believed to be the result of a combination of factors. These factors may include, but are not limited to: 1. The effectiveness of the local anesthetics used. 2. The effects of the analgesic(s) and/or anxiolytic(s) used. 3. The degree of discomfort experienced by the patient at the time of the procedure. 4. The patients ability and reliability in recalling and recording the events. 5. The presence and influence of possible secondary gains and/or psychosocial factors. Reported result: Relief experienced during the 1st hour after the procedure: 30 % (Ultra-Short Term Relief)            Interpretative annotation: Clinically appropriate result. Analgesia during this period is likely to be Local Anesthetic and/or IV Sedative (Analgesic/Anxiolytic) related.          Effects of local anesthetic: The analgesic effects attained during this period are directly associated to the localized infiltration of local anesthetics and therefore cary significant diagnostic value as to the etiological location, or anatomical origin, of the pain. Expected duration of relief is directly dependent on the pharmacodynamics of the local anesthetic used. Long-acting (4-6 hours) anesthetics used.  Reported result: Relief during the next  4 to 6 hour after the procedure: 30 % (Short-Term Relief)            Interpretative annotation: Clinically appropriate result. Analgesia during this period is likely to be Local Anesthetic-related.          Long-term benefit: Defined as the period of time past the expected duration of local anesthetics (1 hour for short-acting and 4-6 hours for long-acting). With the possible exception of prolonged sympathetic blockade from the local anesthetics, benefits during this period are typically attributed to, or associated with, other factors such as analgesic sensory neuropraxia, antiinflammatory effects, or beneficial biochemical changes provided by agents other than the local anesthetics.  Reported result: Extended relief following procedure: 0 %(only lasted for 2 days) (Long-Term Relief)            Interpretative annotation: Clinically appropriate result. Good relief. No permanent benefit expected. Inflammation plays a part in the etiology to the pain.          Current benefits: Defined as reported results that persistent at this point in time.   Analgesia: <50 %            Function: Back to baseline ROM: Back to baseline Interpretative annotation: Recurrence of symptoms. No permanent benefit expected. Effective diagnostic intervention.          Interpretation: Results would suggest a successful diagnostic intervention. We'll proceed with diagnostic intervention #2, as soon as convenient          Plan:  Please see "Plan of Care" for details.  Laboratory Chemistry  Inflammation Markers (CRP: Acute Phase) (ESR: Chronic Phase) Lab Results  Component Value Date   CRP <0.8 11/19/2016   ESRSEDRATE 20 11/19/2016   LATICACIDVEN 0.98 10/07/2014                         Rheumatology Markers No results found for: RF, ANA, LABURIC, URICUR, LYMEIGGIGMAB, LYMEABIGMQN              Renal Function Markers Lab Results  Component Value Date   BUN 7 (L) 12/04/2016   CREATININE 1.09 (H)  12/04/2016   GFRAA 57 (L) 12/04/2016   GFRNONAA 50 (L) 12/04/2016                 Hepatic Function Markers Lab Results  Component Value Date   AST 19 12/04/2016   ALT 15 12/04/2016   ALBUMIN 4.0 12/04/2016   ALKPHOS 104 12/04/2016   AMYLASE 68 01/25/2007   LIPASE 19 04/15/2016   AMMONIA 66 12/04/2016                 Electrolytes Lab Results  Component Value Date   NA 145 (H) 12/04/2016   K 4.0 12/04/2016   CL 107 (H) 12/04/2016   CALCIUM 9.4 12/04/2016   MG 2.0 11/19/2016                        Neuropathy Markers Lab Results  Component Value Date   VITAMINB12 1,888 (H) 11/19/2016   HGBA1C 5.6 05/22/2015                 Bone Pathology Markers Lab Results  Component Value Date   25OHVITD1 37 11/19/2016   25OHVITD2 <1.0 11/19/2016   25OHVITD3 37 11/19/2016                         Coagulation Parameters Lab Results  Component Value Date   INR 1.00 09/13/2016   LABPROT 13.2 09/13/2016   APTT 34 01/04/2011   PLT 263 12/04/2016   DDIMER 1.71 (H) 10/23/2016                 Cardiovascular Markers Lab Results  Component Value Date   BNP 60.2 01/23/2016   TROPONINI <0.03 05/23/2015   HGB 14.7 12/04/2016   HCT 44.8 12/04/2016                 CA Markers No results found for: CEA, CA125, LABCA2               Note: Lab results reviewed.  Recent Diagnostic Imaging Review  Lumbosacral Imaging: Lumbar MR wo contrast:  Results for orders placed during the hospital encounter of 03/18/17  MR LUMBAR SPINE WO CONTRAST   Narrative CLINICAL DATA:  Initial evaluation for low back pain radiating into right hip and lower extremity. Recent right hip fracture.  EXAM: MRI THORACIC AND LUMBAR SPINE WITHOUT CONTRAST  TECHNIQUE: Multiplanar and multiecho pulse sequences of the thoracic and lumbar spine were obtained without intravenous contrast.  COMPARISON:  None available.  FINDINGS: MRI THORACIC SPINE FINDINGS  Alignment: Mild exaggeration of the normal thoracic  kyphosis. No listhesis or subluxation.  Vertebrae: Mild chronic height loss at the T1 through T4 vertebral bodies. Vertebral body heights otherwise maintained. No evidence for acute for subacute fracture. Bone marrow signal intensity within normal limits. Prominent 14 mm hemangioma noted within the T6 vertebral body. No other  discrete or worrisome osseous lesions.  Cord:  Signal intensity within the thoracic spinal cord is normal.  Paraspinal and other soft tissues: Paraspinous soft tissues within normal limits. Partially visualized lungs are grossly clear. Scattered T2 hyperintense cyst noted within the kidneys bilaterally.  Disc levels:  T9-10: Tiny left paracentral disc protrusion indents the ventral thecal sac (series 15, image 26). No stenosis.  T11-12: Shallow central disc protrusion mildly flattens the ventral thecal sac (series 15, image 33). No stenosis.  No other significant degenerative changes within the thoracic spine. No canal or foraminal stenosis.  MRI LUMBAR SPINE FINDINGS  Segmentation: When counting from the dens, there appear to be only 11 pairs of ribs. For the purposes of this dictation, the lowest well-formed disc space will be labeled the L5-S1 level, and the highest non rib-bearing vertebral body will be labeled L1.  Alignment: Mild rotoscoliosis. Vertebral bodies otherwise normally aligned with preservation of the normal lumbar lordosis. No listhesis.  Vertebrae: Vertebral body heights are maintained. No evidence for acute or chronic fracture. The bone marrow signal intensity within normal limits. No discrete or worrisome osseous lesions. No abnormal marrow edema.  Conus medullaris: Extends to the L1-2 level and appears normal.  Paraspinal and other soft tissues: Paraspinous soft tissues demonstrate no acute abnormality. Scattered T2 hyperintense cyst noted within the kidneys, greater on the left. Visualized visceral structures otherwise  unremarkable.  Disc levels:  L1-2:  Unremarkable.  L2-3:  Unremarkable.  L3-4: Diffuse degenerative disc bulge with disc desiccation. Annular fissure noted at the level of the right neural foramen. Moderate facet and ligamentum flavum hypertrophy. Reactive effusions present within the bilateral L3-4 facets. Mild canal and bilateral foraminal stenosis without neural impingement.  L4-5: Mild disc bulge with disc desiccation. Small posterior annular fissure noted. Mild facet and ligamentum flavum hypertrophy. No significance canal stenosis. Mild bilateral L4 foraminal narrowing without impingement.  L5-S1: Normal disc. Mild facet hypertrophy. No significant stenosis.  IMPRESSION: MR THORACIC SPINE IMPRESSION  1. Mild degenerative disc bulging at T9-10 and T11-12 without significant stenosis. 2. Otherwise unremarkable MRI of the thoracic spine. No other significant disc pathology. No canal or neural foraminal stenosis.  MR LUMBAR SPINE IMPRESSION  1. Degenerative disc bulge with facet hypertrophy at L3-4 with resultant mild canal and bilateral foraminal stenosis. 2. Degenerative disc bulge at L4-5 with resultant mild bilateral L4 foraminal narrowing. 3. Otherwise negative MRI of the lumbar spine. No other significant stenosis or evidence for neural impingement.   Electronically Signed   By: Jeannine Boga M.D.   On: 03/18/2017 15:33     Lumbar DG 2-3 views:  Results for orders placed during the hospital encounter of 06/25/17  DG Lumbar Spine 2-3 Views   Narrative CLINICAL DATA:  77 year old female with a history of fall  EXAM: LUMBAR SPINE - 2-3 VIEW  COMPARISON:  CT 10/23/2016  FINDINGS: Lumbar Spine:  Lumbar vertebral elements maintain normal alignment without evidence of anterolisthesis, retrolisthesis, subluxation.  No fracture line identified. Vertebral body heights maintained.  No significant disc space narrowing. Mild endplate  changes throughout the lumbar spine.  Osteopenia.  Vascular calcifications  IMPRESSION: No radiographic evidence of acute fracture or malalignment of the lumbar spine.  Osteopenia.  Mild degenerative changes.   Electronically Signed   By: Corrie Mckusick D.O.   On: 06/25/2017 10:32    Lumbar DG (Complete) 4+V:  Results for orders placed during the hospital encounter of 11/19/16  DG Lumbar Spine Complete   Narrative CLINICAL DATA:  Low back pain for many years.  No specific injury.  EXAM: LUMBAR SPINE - COMPLETE 4+ VIEW  COMPARISON:  CT, 10/23/2016  FINDINGS: There are fractures of posterior twelfth ribs bilaterally, adjacent to the costovertebral junction. These may be recent. These were not evident on the prior chest CT.  No other fractures. All lumbar vertebral bodies normal in height. Lumbar disc spaces are well maintained. No significant degenerative change. Facet joints are well maintained.  Bones are demineralized. There are scattered inferior abdominal aortic vascular calcifications. Soft tissues otherwise unremarkable.  IMPRESSION: 1. Apparent fractures of the twelfth ribs bilaterally adjacent to the costovertebral joints. These could be from the fall reported few months ago. 2. No other evidence of a fracture or acute abnormality.   Electronically Signed   By: Lajean Manes M.D.   On: 11/19/2016 16:46     Hip Imaging:  Hip-R MR wo contrast:  Results for orders placed during the hospital encounter of 01/01/17  MR HIP RIGHT WO CONTRAST   Narrative CLINICAL DATA:  Patient fell on a prolonged broke right hip. Right hip pain and groin pain with limited range of motion.  EXAM: MR OF THE RIGHT HIP WITHOUT CONTRAST  TECHNIQUE: Multiplanar, multisequence MR imaging was performed. No intravenous contrast was administered.  COMPARISON:  CT from 12/05/2016  FINDINGS: Bones: Susceptibility artifact from right femoral nail fixation across an  intratrochanteric fracture of the right femur is identified. Detail of the fracture, better visualized on the recent CT is compromised as result. There appears to be marrow edema of the weight-bearing portion of the femoral head. Curvilinear hypointensity of the subcortical right femoral head raises the possibility of avascular necrosis, series 2, image 20. Sub chondral degenerative cyst of the acetabular roof is seen on the right.  Articular cartilage and labrum  Articular cartilage: Given joint space narrowing of both hips, and there is likely moderate thinning 10 of the acetabular and femoral head cartilage bilaterally.  Labrum: No definite labral tear though assessment is limited by lack of joint fluid.  Joint or bursal effusion  Joint effusion:  No joint effusion  Bursae:  No bursal fluid collections  Muscles and tendons  Muscles and tendons: Mild edema of the right gluteus muscles possibly representing stigmata of mild strain or tendinopathy.  Other findings  Miscellaneous: The included SI joints are nonacute. The pubic symphysis is maintained. No sacral insufficiency fracture is identified. The patient is status post hysterectomy. No adnexal mass is noted.  IMPRESSION: 1. Limited study due to pre-existing right femoral nail fixation causing susceptibility artifacts through a known intertrochanteric fracture of the right femur. 2. Despite susceptibility artifacts, there does appear to be some evidence of marrow edema along the weight-bearing portion of the right femoral head. A curvilinear hypointensity seen along involving the subcortical bone and the possibility of AVN is not excluded. 3. Marrow edema adjacent to the greater trochanter likely representing mild gluteal muscle strain.   Electronically Signed   By: Ashley Royalty M.D.   On: 01/01/2017 23:11    Hip-R CT wo contrast:  Results for orders placed during the hospital encounter of 06/27/17  CT Hip  Right Wo Contrast   Narrative CLINICAL DATA:  Status fall 2 days ago.  Right hip pain.  EXAM: CT OF THE RIGHT HIP WITHOUT CONTRAST  TECHNIQUE: Multidetector CT imaging of the right hip was performed according to the standard protocol. Multiplanar CT image reconstructions were also generated.  COMPARISON:  None.  FINDINGS: Bones/Joint/Cartilage  Chronic  ununited intertrochanteric fracture with sclerotic margins on either side of the fracture cleft transfixed with an intramedullary nail and interlocking femoral neck screw.  No acute fracture or dislocation.  Normal alignment. No joint effusion.  Severe axial right hip joint space narrowing with a bone-on-bone appearance and subchondral reactive marrow changes with small inferior humeral marginal osteophytes consistent with advanced osteoarthritis.  Ligaments  Ligaments are suboptimally evaluated by CT.  Muscles and Tendons Muscles are normal.  No muscle atrophy.  Soft tissue No fluid collection or hematoma.  No soft tissue mass.  IMPRESSION: 1. 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 2.  No acute osseous injury of the right hip. 3. Severe osteoarthritis of the right hip.   Electronically Signed   By: Kathreen Devoid   On: 06/27/2017 15:22    Hip-R DG 2-3 views:  Results for orders placed during the hospital encounter of 06/25/17  DG Hip Unilat W or Wo Pelvis 2-3 Views Right   Narrative CLINICAL DATA:  Right hip pain since a fall today. Initial encounter.  EXAM: DG HIP (WITH OR WITHOUT PELVIS) 2-3V RIGHT  COMPARISON:  Plain films right hip 11/21/2016.  FINDINGS: No acute bony or joint abnormality is identified. Healed right intertrochanteric fracture with fixation hardware in place is unchanged in appearance. Mild degenerative disease is present about the  hips.  IMPRESSION: No acute abnormality.   Electronically Signed   By: Inge Rise M.D.   On: 06/25/2017 10:36      Complexity Note: Imaging results reviewed. Results shared with [name], using Layman's terms.                         Meds   Current Outpatient Medications:  .  albuterol (PROVENTIL HFA;VENTOLIN HFA) 108 (90 BASE) MCG/ACT inhaler, Inhale 1 puff into the lungs every 6 (six) hours as needed for wheezing or shortness of breath., Disp: , Rfl:  .  albuterol (PROVENTIL) (2.5 MG/3ML) 0.083% nebulizer solution, Take 2.5 mg by nebulization every 6 (six) hours as needed for wheezing or shortness of breath., Disp: , Rfl:  .  ALPRAZolam (XANAX) 1 MG tablet, Take 1 mg by mouth at bedtime as needed. , Disp: , Rfl:  .  amLODipine (NORVASC) 5 MG tablet, Take 5 mg by mouth daily. , Disp: , Rfl:  .  Calcium-Vitamin D-Vitamin K (CALCIUM SOFT CHEWS PO), Take by mouth daily at 6 (six) AM., Disp: , Rfl:  .  citalopram (CELEXA) 20 MG tablet, Take 20 mg by mouth daily. , Disp: , Rfl:  .  diphenhydrAMINE (BENADRYL) 25 MG tablet, Take 25 mg by mouth as needed., Disp: , Rfl:  .  docusate sodium (COLACE) 100 MG capsule, Take 100 mg by mouth as needed for mild constipation., Disp: , Rfl:  .  esomeprazole (NEXIUM) 40 MG capsule, Take 40 mg by mouth 2 (two) times daily before a meal. , Disp: , Rfl:  .  gabapentin (NEURONTIN) 400 MG capsule, Take 1 capsule (400 mg total) by mouth 4 (four) times daily., Disp: 120 capsule, Rfl: 2 .  lovastatin (MEVACOR) 10 MG tablet, Take 10 mg by mouth daily., Disp: , Rfl:  .  meloxicam (MOBIC) 15 MG tablet, Take 15 mg by mouth daily. , Disp: , Rfl:  .  omega-3 fish oil (MAXEPA) 1000 MG CAPS capsule, Take by mouth daily at 6 (six) AM.,  Disp: , Rfl:  .  ondansetron (ZOFRAN) 4 MG tablet, Take 4 mg by mouth every 8 (eight) hours as needed for nausea. , Disp: , Rfl:  .  orphenadrine (NORFLEX) 100 MG tablet, Take 1 tablet (100 mg total) by mouth 2 (two) times  daily., Disp: 60 tablet, Rfl: 1 .  polyethylene glycol powder (GLYCOLAX/MIRALAX) powder, Take by mouth as directed. , Disp: , Rfl:  .  promethazine (PHENERGAN) 12.5 MG suppository, Place 12.5 mg rectally as needed for nausea or vomiting., Disp: , Rfl:  .  senna (SENOKOT) 8.6 MG TABS tablet, Take 2 tablets by mouth. constipation, Disp: , Rfl:  .  traZODone (DESYREL) 100 MG tablet, Take 50 mg by mouth at bedtime. , Disp: , Rfl:  .  baclofen (LIORESAL) 10 MG tablet, Take 1 tablet (10 mg total) by mouth daily., Disp: 30 tablet, Rfl: 2 .  diclofenac sodium (VOLTAREN) 1 % GEL, Apply 2 g topically 4 (four) times daily., Disp: 100 g, Rfl: 2 .  HYDROcodone-acetaminophen (NORCO/VICODIN) 5-325 MG tablet, Take 1 tablet by mouth every 8 (eight) hours. (Patient not taking: Reported on 06/27/2017), Disp: 90 tablet, Rfl: 0  ROS  Constitutional: Denies any fever or chills Gastrointestinal: No reported hemesis, hematochezia, vomiting, or acute GI distress Musculoskeletal: Denies any acute onset joint swelling, redness, loss of ROM, or weakness Neurological: No reported episodes of acute onset apraxia, aphasia, dysarthria, agnosia, amnesia, paralysis, loss of coordination, or loss of consciousness  Allergies  [name] is allergic to ampicillin-sulbactam sodium; ampicillin; toradol [ketorolac tromethamine]; tramadol; flexeril [cyclobenzaprine]; ambien [zolpidem tartrate]; cephalexin; ketorolac; percocet [oxycodone-acetaminophen]; sulfamethoxazole-trimethoprim; and tape.  [place]  Drug: [name]  reports that she does not use drugs. Alcohol:  reports that she does not drink alcohol. Tobacco:  reports that she has quit smoking. She has a 45.00 pack-year smoking history. She uses smokeless tobacco. Medical:  has a past medical history of Anemia, B12 deficiency, Bacteremia (10/07/2014), Bladder incontinence, Blind left eye, Cataract, Cervical spine fracture ([place]), Chest pain (07/29/2013), Compression fracture, COPD  (chronic obstructive pulmonary disease) ([place]), DDD (degenerative disc disease), lumbar, Depression, Diffuse myofascial pain syndrome (03/24/2015), Fever (10/05/2014), GERD (gastroesophageal reflux disease), Hiatal hernia, Hyperlipidemia, Hypertension, Hypertensive kidney disease, malignant (11/07/2016), Macular degeneration, Osteoarthritis, Osteoporosis, Reactive airway disease, Rupture of bowel ([place]), Sepsis ([place]) (10/08/2014), Stroke [facility]), Stroke ([place]), and Wrist pain, acute (09/10/2012). Surgical: [name]  has a past surgical history that includes Abdominal surgery; Colon surgery; Joint replacement; Appendectomy; Bladder surgery; Rectocele repair; Knee surgery; Cholecystectomy; Abdominal hysterectomy; Ostomy; Eye surgery; Intramedullary (im) nail intertrochanteric (Right, 09/13/2016); and Hip surgery (Right, 08/2016). Family: family history includes Heart attack in her father; Heart failure in her mother.  Constitutional Exam  General appearance: Well nourished, well developed, and well hydrated. In no apparent acute distress Vitals:   07/10/17 1305  BP: 126/69  Pulse: (!) 55  Resp: 18  Temp: 98 F (36.7 C)  TempSrc: Oral  SpO2: 97%  Weight: 185 lb (83.9 kg)  Height: '5\' 5"'  (1.651 m)  Psych/Mental status: Alert, oriented x 3 (person, place, & time)       Eyes: PERLA Respiratory: No evidence of acute respiratory distress  Cervical Spine Area Exam  Skin & Axial Inspection: No masses, redness, edema, swelling, or associated skin lesions Alignment: Symmetrical Functional ROM: Unrestricted ROM      Stability: No instability detected Muscle Tone/Strength: Functionally intact. No obvious neuro-muscular anomalies detected. Sensory (Neurological): Unimpaired Palpation: No palpable anomalies  Upper Extremity (UE) Exam    Side: Right upper extremity  Side: Left upper extremity  Skin & Extremity Inspection: Skin color, temperature, and hair growth are WNL. No peripheral edema or  cyanosis. No masses, redness, swelling, asymmetry, or associated skin lesions. No contractures.  Skin & Extremity Inspection: Skin color, temperature, and hair growth are WNL. No peripheral edema or cyanosis. No masses, redness, swelling, asymmetry, or associated skin lesions. No contractures.  Functional ROM: Unrestricted ROM          Functional ROM: Unrestricted ROM          Muscle Tone/Strength: Functionally intact. No obvious neuro-muscular anomalies detected.  Muscle Tone/Strength: Functionally intact. No obvious neuro-muscular anomalies detected.  Sensory (Neurological): Unimpaired          Sensory (Neurological): Unimpaired          Palpation: No palpable anomalies              Palpation: No palpable anomalies              Specialized Test(s): Deferred         Specialized Test(s): Deferred          Thoracic Spine Area Exam  Skin & Axial Inspection: No masses, redness, or swelling Alignment: Symmetrical Functional ROM: Unrestricted ROM Stability: No instability detected Muscle Tone/Strength: Functionally intact. No obvious neuro-muscular anomalies detected. Sensory (Neurological): Unimpaired Muscle strength & Tone: No palpable anomalies  Lumbar Spine Area Exam  Skin & Axial Inspection: No masses, redness, or swelling Alignment: Symmetrical Functional ROM: Unrestricted ROM      Stability: No instability detected Muscle Tone/Strength: Functionally intact. No obvious neuro-muscular anomalies detected. Sensory (Neurological): Unimpaired Palpation: Tender       Provocative Tests: Lumbar Hyperextension and rotation test: evaluation deferred today       Lumbar Lateral bending test: evaluation deferred today       Patrick's Maneuver: evaluation deferred today                    Gait & Posture Assessment  Ambulation: Patient came in today in a wheel chair Gait: Relatively normal for age and body habitus Posture: WNL   Lower Extremity Exam    Side: Right lower extremity  Side: Left  lower extremity  Skin & Extremity Inspection: Evidence of prior surgery  Skin & Extremity Inspection: Skin color, temperature, and hair growth are WNL. No peripheral edema or cyanosis. No masses, redness, swelling, asymmetry, or associated skin lesions. No contractures.  Functional ROM: Decreased ROM          Functional ROM: Unrestricted ROM          Muscle Tone/Strength: Functionally intact. No obvious neuro-muscular anomalies detected.  Muscle Tone/Strength: Functionally intact. No obvious neuro-muscular anomalies detected.  Sensory (Neurological): Unimpaired  Sensory (Neurological): Unimpaired  Palpation: No palpable anomalies  Palpation: No palpable anomalies   Assessment  Primary Diagnosis & Pertinent Problem List: The primary encounter diagnosis was Chronic sacroiliac joint pain (Right). 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  Status Diagnosis  Not responding Not improving Not responding 1. Chronic sacroiliac joint pain (Right)   2. Lumbar facet arthropathy (Bilateral)   3. Chronic hip pain (Right)   4. DDD (degenerative disc disease), lumbar   5. Chronic low back pain [facility]  Area of Pain) (Bilateral) (L>R)   6. Chronic right sacroiliac pain   7. Lumbar facet syndrome (Bilateral) (L>R)     Problems updated and reviewed during this visit: No problems updated. Plan of Care  Pharmacotherapy (Medications Ordered): No orders of the defined types were placed in this encounter.  New Prescriptions   No medications on file   Medications administered today: Maddyn L. Poynor had no medications administered during this visit. Lab-work, procedure(s), and/or referral(s): No orders of the defined types were placed in this  encounter.  Imaging and/or referral(s): None Interventional management options: Planned, scheduled, and/or pending:   Diagnostic right-sided lumbar facet block + right-sided sacroiliac joint block   Considering:   Diagnostic right-sided intra-articular knee joint injection Possible series of 5 right-sided intra-articular Hyalgan knee injections Diagnostic right-sided genicular nerve block Possible right-sided genicular nerve RFA Diagnostic left cervical epidural steroid injection Diagnostic bilateral cervical facet blocks Diagnostic bilateral intra-articular shoulder joint injection Diagnostic bilateral suprascapular nerve block Possible bilateral suprascapular nerve RFA Diagnostic bilateral lumbar facet block Possible bilateral lumbar facet RFA   Palliative PRN treatment(s):   None at this time    Provider-requested follow-up: Return for Procedure(w/Sedation), w/ Dr. Dossie Arbour, ([facility]).  Future Appointments  Date Time Provider [place]  07/17/2017  9:30 AM Milinda Pointer, MD [facility] None  07/21/2017 10:15 AM Milinda Pointer, MD [facility] None  07/22/2017 10:15 AM [facility] None   Primary Care Physician: Wenda Low, MD Location: [facility] Note by: Vevelyn Francois NP Date: 07/10/2017; Time: 2:14 PM  Pain Score Disclaimer: We use the NRS-11 scale. This is a self-reported, subjective measurement of pain severity with only modest accuracy. It is used primarily to identify changes within a particular patient. It must be understood that outpatient pain scales are significantly less accurate that those used for research, where they can be applied under ideal controlled circumstances with minimal exposure to variables. In reality, the score is likely to be a combination of pain intensity and pain affect, where pain affect describes the degree of emotional arousal or changes in action readiness caused by the  sensory experience of pain. Factors such as social and work situation, setting, emotional state, anxiety levels, expectation, and prior pain experience may influence pain perception and show large inter-individual differences that may also be affected by time variables.  Patient instructions provided during this appointment: Patient Instructions   ____________________________________________________________________________________________  Pain Scale  Introduction: The pain score used by this practice is the Verbal Numerical Rating Scale (VNRS-11). This is an 11-point scale. It is for adults and children 10 years or older. There are significant differences in how the pain score is reported, used, and applied. Forget everything you learned in the past and learn this scoring system.  General Information: The scale should reflect your current level of pain. Unless you are specifically asked for the level of your worst pain, or your average pain. If you are asked for one of these two, then it should be understood that it is over the past 24 hours.  Basic Activities of Daily Living (ADL): Personal hygiene, dressing, eating, transferring, and using restroom.  Instructions: Most patients tend to report their level of pain as a combination of two factors, their physical pain and their psychosocial pain. This last one is also known as "suffering" and it is reflection of how physical pain affects you socially and psychologically. From now on, report them separately. From this point on, when asked to report  your pain level, report only your physical pain. Use the following table for reference.  Pain Clinic Pain Levels (0-5/10)  Pain Level Score  Description  No Pain 0   Mild pain 1 Nagging, annoying, but does not interfere with basic activities of daily living (ADL). Patients are able to eat, bathe, get dressed, toileting (being able to get on and off the toilet and perform personal hygiene functions),  transfer (move in and out of bed or a chair without assistance), and maintain continence (able to control bladder and bowel functions). Blood pressure and heart rate are unaffected. A normal heart rate for a healthy adult ranges from 60 to 100 bpm (beats per minute).   Mild to moderate pain 2 Noticeable and distracting. Impossible to hide from other people. More frequent flare-ups. Still possible to adapt and function close to normal. It can be very annoying and may have occasional stronger flare-ups. With discipline, patients may get used to it and adapt.   Moderate pain 3 Interferes significantly with activities of daily living (ADL). It becomes difficult to feed, bathe, get dressed, get on and off the toilet or to perform personal hygiene functions. Difficult to get in and out of bed or a chair without assistance. Very distracting. With effort, it can be ignored when deeply involved in activities.   Moderately severe pain 4 Impossible to ignore for more than a few minutes. With effort, patients may still be able to manage work or participate in some social activities. Very difficult to concentrate. Signs of autonomic nervous system discharge are evident: dilated pupils (mydriasis); mild sweating (diaphoresis); sleep interference. Heart rate becomes elevated (>115 bpm). Diastolic blood pressure (lower number) rises above 100 mmHg. Patients find relief in laying down and not moving.   Severe pain 5 Intense and extremely unpleasant. Associated with frowning face and frequent crying. Pain overwhelms the senses.  Ability to do any activity or maintain social relationships becomes significantly limited. Conversation becomes difficult. Pacing back and forth is common, as getting into a comfortable position is nearly impossible. Pain wakes you up from deep sleep. Physical signs will be obvious: pupillary dilation; increased sweating; goosebumps; brisk reflexes; cold, clammy hands and feet; nausea, vomiting or  dry heaves; loss of appetite; significant sleep disturbance with inability to fall asleep or to remain asleep. When persistent, significant weight loss is observed due to the complete loss of appetite and sleep deprivation.  Blood pressure and heart rate becomes significantly elevated. Caution: If elevated blood pressure triggers a pounding headache associated with blurred vision, then the patient should immediately seek attention at an urgent or emergency care unit, as these may be signs of an impending stroke.    Emergency Department Pain Levels (6-10/10)  Emergency Room Pain 6 Severely limiting. Requires emergency care and should not be seen or managed at an outpatient pain management facility. Communication becomes difficult and requires great effort. Assistance to reach the emergency department may be required. Facial flushing and profuse sweating along with potentially dangerous increases in heart rate and blood pressure will be evident.   Distressing pain 7 Self-care is very difficult. Assistance is required to transport, or use restroom. Assistance to reach the emergency department will be required. Tasks requiring coordination, such as bathing and getting dressed become very difficult.   Disabling pain 8 Self-care is no longer possible. At this level, pain is disabling. The individual is unable to do even the most "basic" activities such as walking, eating, bathing, dressing, transferring to a bed,  or toileting. Fine motor skills are lost. It is difficult to think clearly.   Incapacitating pain 9 Pain becomes incapacitating. Thought processing is no longer possible. Difficult to remember your own name. Control of movement and coordination are lost.   The worst pain imaginable 10 At this level, most patients pass out from pain. When this level is reached, collapse of the autonomic nervous system occurs, leading to a sudden drop in blood pressure and heart rate. This in turn results in a  temporary and dramatic drop in blood flow to the brain, leading to a loss of consciousness. Fainting is one of the body's self defense mechanisms. Passing out puts the brain in a calmed state and causes it to shut down for a while, in order to begin the healing process.    Summary: 1. Refer to this scale when providing [place] with your pain level. 2. Be accurate and careful when reporting your pain level. This will help with your care. 3. Over-reporting your pain level will lead to loss of credibility. 4. Even a level of 1/10 means that there is pain and will be treated at our facility. 5. High, inaccurate reporting will be documented as "Symptom Exaggeration", leading to loss of credibility and suspicions of possible secondary gains such as obtaining more narcotics, or wanting to appear disabled, for fraudulent reasons. 6. Only pain levels of 5 or below will be seen at our facility. 7. Pain levels of 6 and above will be sent to the Emergency Department and the appointment cancelled. ____________________________________________________________________________________________   GENERAL RISKS AND COMPLICATIONS  What are the risk, side effects and possible complications? Generally speaking, most procedures are safe.  However, with any procedure there are risks, side effects, and the possibility of complications.  The risks and complications are dependent upon the sites that are lesioned, or the type of nerve block to be performed.  The closer the procedure is to the spine, the more serious the risks are.  Great care is taken when placing the radio frequency needles, block needles or lesioning probes, but sometimes complications can occur. 1. Infection: Any time there is an injection through the skin, there is a risk of infection.  This is why sterile conditions are used for these blocks.  There are four possible types of infection. 1. Localized skin infection. 2. Central Nervous System Infection-This  can be in the form of Meningitis, which can be deadly. 3. Epidural Infections-This can be in the form of an epidural abscess, which can cause pressure inside of the spine, causing compression of the spinal cord with subsequent paralysis. This would require an emergency surgery to decompress, and there are no guarantees that the patient would recover from the paralysis. 4. Discitis-This is an infection of the intervertebral discs.  It occurs in about 1% of discography procedures.  It is difficult to treat and it may lead to surgery.        2. Pain: the needles have to go through skin and soft tissues, will cause soreness.       3. Damage to internal structures:  The nerves to be lesioned may be near blood vessels or    other nerves which can be potentially damaged.       4. Bleeding: Bleeding is more common if the patient is taking blood thinners such as  aspirin, Coumadin, Ticiid, Plavix, etc., or if he/she have some genetic predisposition  such as hemophilia. Bleeding into the spinal canal can cause compression of the spinal  cord with subsequent paralysis.  This would require an emergency surgery to  decompress and there are no guarantees that the patient would recover from the  paralysis.       5. Pneumothorax:  Puncturing of a lung is a possibility, every time a needle is introduced in  the area of the chest or upper back.  Pneumothorax refers to free air around the  collapsed lung(s), inside of the thoracic cavity (chest cavity).  Another two possible  complications related to a similar event would include: Hemothorax and Chylothorax.   These are variations of the Pneumothorax, where instead of air around the collapsed  lung(s), you may have blood or chyle, respectively.       6. Spinal headaches: They may occur with any procedures in the area of the spine.       7. Persistent CSF (Cerebro-Spinal Fluid) leakage: This is a rare problem, but may occur  with prolonged intrathecal or epidural catheters  either due to the formation of a fistulous  track or a dural tear.       8. Nerve damage: By working so close to the spinal cord, there is always a possibility of  nerve damage, which could be as serious as a permanent spinal cord injury with  paralysis.       9. Death:  Although rare, severe deadly allergic reactions known as "Anaphylactic  reaction" can occur to any of the medications used.      10. Worsening of the symptoms:  We can always make thing worse.  What are the chances of something [name] this happening? Chances of any of this occuring are extremely low.  By statistics, you have more of a chance of getting killed in a motor vehicle accident: while driving to the hospital than any of the above occurring .  Nevertheless, you should be aware that they are possibilities.  In general, it is similar to taking a shower.  Everybody knows that you can slip, hit your head and get killed.  Does that mean that you should not shower again?  Nevertheless always keep in mind that statistics do not mean anything if you happen to be on the wrong side of them.  Even if a procedure has a 1 (one) in a 1,000,000 (million) chance of going wrong, it you happen to be that one..Also, keep in mind that by statistics, you have more of a chance of having something go wrong when taking medications.  Who should not have this procedure? If you are on a blood thinning medication (e.g. Coumadin, Plavix, see list of "Blood Thinners"), or if you have an active infection going on, you should not have the procedure.  If you are taking any blood thinners, please inform your physician.  How should I prepare for this procedure?  Do not eat or drink anything at least six hours prior to the procedure.  Bring a driver with you .  It cannot be a taxi.  Come accompanied by an adult that can drive you back, and that is strong enough to help you if your legs get weak or numb from the local anesthetic.  Take all of your medicines the  morning of the procedure with just enough water to swallow them.  If you have diabetes, make sure that you are scheduled to have your procedure done first thing in the morning, whenever possible.  If you have diabetes, take only half of your insulin dose and notify our nurse that you  have done so as soon as you arrive at the clinic.  If you are diabetic, but only take blood sugar pills (oral hypoglycemic), then do not take them on the morning of your procedure.  You may take them after you have had the procedure.  Do not take aspirin or any aspirin-containing medications, at least eleven (11) days prior to the procedure.  They may prolong bleeding.  Wear loose fitting clothing that may be easy to take off and that you would not mind if it got stained with Betadine or blood.  Do not wear any jewelry or perfume  Remove any nail coloring.  It will interfere with some of our monitoring equipment.  NOTE: Remember that this is not meant to be interpreted as a complete list of all possible complications.  Unforeseen problems may occur.  BLOOD THINNERS The following drugs contain aspirin or other products, which can cause increased bleeding during surgery and should not be taken for 2 weeks prior to and 1 week after surgery.  If you should need take something for relief of minor pain, you may take acetaminophen which is found in Tylenol,m Datril, Anacin-3 and Panadol. It is not blood thinner. The products listed below are.  Do not take any of the products listed below in addition to any listed on your instruction sheet.  A.P.C or A.P.C with Codeine Codeine Phosphate Capsules #3 Ibuprofen Ridaura  ABC compound Congesprin Imuran rimadil  Advil Cope Indocin Robaxisal  Alka-Seltzer Effervescent Pain Reliever and Antacid Coricidin or Coricidin-D  Indomethacin Rufen  Alka-Seltzer plus Cold Medicine Cosprin Ketoprofen S-A-C Tablets  Anacin Analgesic Tablets or Capsules Coumadin Korlgesic Salflex  Anacin  Extra Strength Analgesic tablets or capsules CP-2 Tablets Lanoril Salicylate  Anaprox Cuprimine Capsules Levenox Salocol  Anexsia-D Dalteparin Magan Salsalate  Anodynos Darvon compound Magnesium Salicylate Sine-off  Ansaid Dasin Capsules Magsal Sodium Salicylate  Anturane Depen Capsules Marnal Soma  APF Arthritis pain formula Dewitt's Pills Measurin Stanback  Argesic Dia-Gesic Meclofenamic Sulfinpyrazone  Arthritis Bayer Timed Release Aspirin Diclofenac Meclomen Sulindac  Arthritis pain formula Anacin Dicumarol Medipren Supac  Analgesic (Safety coated) Arthralgen Diffunasal Mefanamic Suprofen  Arthritis Strength Bufferin Dihydrocodeine Mepro Compound Suprol  Arthropan liquid Dopirydamole Methcarbomol with Aspirin Synalgos  ASA tablets/Enseals Disalcid Micrainin Tagament  Ascriptin Doan's Midol Talwin  Ascriptin A/D Dolene Mobidin Tanderil  Ascriptin Extra Strength Dolobid Moblgesic Ticlid  Ascriptin with Codeine Doloprin or Doloprin with Codeine Momentum Tolectin  Asperbuf Duoprin Mono-gesic Trendar  Aspergum Duradyne Motrin or Motrin IB Triminicin  Aspirin plain, buffered or enteric coated Durasal Myochrisine Trigesic  Aspirin Suppositories Easprin Nalfon Trillsate  Aspirin with Codeine Ecotrin Regular or Extra Strength Naprosyn Uracel  Atromid-S Efficin Naproxen Ursinus  Auranofin Capsules Elmiron Neocylate Vanquish  Axotal Emagrin Norgesic Verin  Azathioprine Empirin or Empirin with Codeine Normiflo Vitamin E  Azolid Emprazil Nuprin Voltaren  Bayer Aspirin plain, buffered or children's or timed BC Tablets or powders Encaprin Orgaran Warfarin Sodium  Buff-a-Comp Enoxaparin Orudis Zorpin  Buff-a-Comp with Codeine Equegesic Os-Cal-Gesic   Buffaprin Excedrin plain, buffered or Extra Strength Oxalid   Bufferin Arthritis Strength Feldene Oxphenbutazone   Bufferin plain or Extra Strength Feldene Capsules Oxycodone with Aspirin   Bufferin with Codeine Fenoprofen Fenoprofen Pabalate or  Pabalate-SF   Buffets II Flogesic Panagesic   Buffinol plain or Extra Strength Florinal or Florinal with Codeine Panwarfarin   Buf-Tabs Flurbiprofen Penicillamine   Butalbital Compound Four-way cold tablets Penicillin   Butazolidin Fragmin Pepto-Bismol   Carbenicillin Geminisyn Percodan  Carna Arthritis Reliever Geopen Persantine   Carprofen Gold's salt Persistin   Chloramphenicol Goody's Phenylbutazone   Chloromycetin Haltrain Piroxlcam   Clmetidine heparin Plaquenil   Cllnoril Hyco-pap Ponstel   Clofibrate Hydroxy chloroquine Propoxyphen         Before stopping any of these medications, be sure to consult the physician who ordered them.  Some, such as Coumadin (Warfarin) are ordered to prevent or treat serious conditions such as "deep thrombosis", "pumonary embolisms", and other heart problems.  The amount of time that you may need off of the medication may also vary with the medication and the reason for which you were taking it.  If you are taking any of these medications, please make sure you notify your pain physician before you undergo any procedures.         Sacroiliac (SI) Joint Injection Patient Information  Description: The sacroiliac joint connects the scrum (very low back and tailbone) to the ilium (a pelvic bone which also forms half of the hip joint).  Normally this joint experiences very little motion.  When this joint becomes inflamed or unstable low back and or hip and pelvis pain may result.  Injection of this joint with local anesthetics (numbing medicines) and steroids can provide diagnostic information and reduce pain.  This injection is performed with the aid of x-ray guidance into the tailbone area while you are lying on your stomach.   You may experience an electrical sensation down the leg while this is being done.  You may also experience numbness.  We also may ask if we are reproducing your normal pain during the injection.  Conditions which may be treated  SI injection:   Low back, buttock, hip or leg pain  Preparation for the Injection:  1. Do not eat any solid food or dairy products within 8 hours of your appointment.  2. You may drink clear liquids up to 3 hours before appointment.  Clear liquids include water, black coffee, juice or soda.  No milk or cream please. 3. You may take your regular medications, including pain medications with a sip of water before your appointment.  Diabetics should hold regular insulin (if take separately) and take 1/2 normal NPH dose the morning of the procedure.  Carry some sugar containing items with you to your appointment. 4. A driver must accompany you and be prepared to drive you home after your procedure. 5. Bring all of your current medications with you. 6. An IV may be inserted and sedation may be given at the discretion of the physician. 7. A blood pressure cuff, EKG and other monitors will often be applied during the procedure.  Some patients may need to have extra oxygen administered for a short period.  8. You will be asked to provide medical information, including your allergies, prior to the procedure.  We must know immediately if you are taking blood thinners ([name] Coumadin/Warfarin) or if you are allergic to IV iodine contrast (dye).  We must know if you could possible be pregnant.  Possible side effects:   Bleeding from needle site  Infection (rare, may require surgery)  Nerve injury (rare)  Numbness & tingling (temporary)  A brief convulsion or seizure  Light-headedness (temporary)  Pain at injection site (several days)  Decreased blood pressure (temporary)  Weakness in the leg (temporary)   Call if you experience:   New onset weakness or numbness of an extremity below the injection site that last more than 8 hours.  Hives or  difficulty breathing ( go to the emergency room)  Inflammation or drainage at the injection site  Any new symptoms which are concerning to  you  Please note:  Although the local anesthetic injected can often make your back/ hip/ buttock/ leg feel good for several hours after the injections, the pain will likely return.  It takes 3-7 days for steroids to work in the sacroiliac area.  You may not notice any pain relief for at least that one week.  If effective, we will often do a series of three injections spaced 3-6 weeks apart to maximally decrease your pain.  After the initial series, we generally will wait some months before a repeat injection of the same type.  If you have any questions, please call [phone] [facility]   Facet Joint Block The facet joints connect the bones of the spine (vertebrae). They make it possible for you to bend, twist, and make other movements with your spine. They also keep you from bending too far, twisting too far, and making other excessive movements. A facet joint block is a procedure where a numbing medicine (anesthetic) is injected into a facet joint. Often, a type of anti-inflammatory medicine called a steroid is also injected. A facet joint block may be done to diagnose neck or back pain. If the pain gets better after a facet joint block, it means the pain is probably coming from the facet joint. If the pain does not get better, it means the pain is probably not coming from the facet joint. A facet joint block may also be done to relieve neck or back pain caused by an inflamed facet joint. A facet joint block is only done to relieve pain if the pain does not improve with other methods, such as medicine, exercise programs, and physical therapy. Tell a health care provider about:  Any allergies you have.  All medicines you are taking, including vitamins, herbs, eye drops, creams, and over-the-counter medicines.  Any problems you or family members have had with anesthetic medicines.  Any blood disorders you have.  Any surgeries you have had.  Any medical  conditions you have.  Whether you are pregnant or may be pregnant. What are the risks? Generally, this is a safe procedure. However, problems may occur, including:  Bleeding.  Injury to a nerve near the injection site.  Pain at the injection site.  Weakness or numbness in areas controlled by nerves near the injection site.  Infection.  Temporary fluid retention.  Allergic reactions to medicines or dyes.  Injury to other structures or organs near the injection site.  What happens before the procedure?  Follow instructions from your health care provider about eating or drinking restrictions.  Ask your health care provider about: ? Changing or stopping your regular medicines. This is especially important if you are taking diabetes medicines or blood thinners. ? Taking medicines such as aspirin and ibuprofen. These medicines can thin your blood. Do not take these medicines before your procedure if your health care provider instructs you not to.  Do not take any new dietary supplements or medicines without asking your health care provider first.  Plan to have someone take you home after the procedure. What happens during the procedure?  You may need to remove your clothing and dress in an open-back gown.  The procedure will be done while you are lying on an X-ray table. You will most likely be asked to lie on your stomach,  but you may be asked to lie in a different position if an injection will be made in your neck.  Machines will be used to monitor your oxygen levels, heart rate, and blood pressure.  If an injection will be made in your neck, an IV tube will be inserted into one of your veins. Fluids and medicine will flow directly into your body through the IV tube.  The area over the facet joint where the injection will be made will be cleaned with soap. The surrounding skin will be covered with clean drapes.  A numbing medicine (local anesthetic) will be applied to your  skin. Your skin may sting or burn for a moment.  A video X-ray machine (fluoroscopy) will be used to locate the joint. In some cases, a CT scan may be used.  A contrast dye may be injected into the facet joint area to help locate the joint.  When the joint is located, an anesthetic will be injected into the joint through the needle.  Your health care provider will ask you whether you feel pain relief. If you do feel relief, a steroid may be injected to provide pain relief for a longer period of time. If you do not feel relief or feel only partial relief, additional injections of an anesthetic may be made in other facet joints.  The needle will be removed.  Your skin will be cleaned.  A bandage (dressing) will be applied over each injection site. The procedure may vary among health care providers and hospitals. What happens after the procedure?  You will be observed for 15-30 minutes before being allowed to go home. This information is not intended to replace advice given to you by your health care provider. Make sure you discuss any questions you have with your health care provider. Document Released: 10/02/2006 Document Revised: 06/14/2015 Document Reviewed: 02/06/2015 Elsevier Interactive Patient Education  2018 [place].  BMI Assessment: Estimated body mass index is 30.79 kg/m as calculated from the following:   Height as of this encounter: '5\' 5"'  (1.651 m).   Weight as of this encounter: 185 lb (83.9 kg).  BMI interpretation table: BMI level Category Range association with higher incidence of chronic pain  <18 kg/m2 Underweight   18.5-24.9 kg/m2 Ideal body weight   25-29.9 kg/m2 Overweight Increased incidence by 20%  30-34.9 kg/m2 Obese (Class I) Increased incidence by 68%  35-39.9 kg/m2 Severe obesity (Class II) Increased incidence by 136%  >40 kg/m2 Extreme obesity (Class III) Increased incidence by 254%   BMI Readings from Last 4 Encounters:  07/10/17 30.79 kg/m   07/03/17 30.95 kg/m  07/02/17 30.95 kg/m  06/27/17 30.79 kg/m   Wt Readings from Last 4 Encounters:  07/10/17 185 lb (83.9 kg)  07/03/17 186 lb (84.4 kg)  07/02/17 186 lb (84.4 kg)  06/27/17 185 lb (83.9 kg)

## 2017-07-14 ENCOUNTER — Encounter: Payer: Medicare Other | Admitting: Physical Therapy

## 2017-07-17 ENCOUNTER — Other Ambulatory Visit: Payer: Self-pay

## 2017-07-17 ENCOUNTER — Ambulatory Visit (HOSPITAL_BASED_OUTPATIENT_CLINIC_OR_DEPARTMENT_OTHER): Payer: Medicare Other | Admitting: Pain Medicine

## 2017-07-17 ENCOUNTER — Encounter: Payer: Self-pay | Admitting: Pain Medicine

## 2017-07-17 ENCOUNTER — Ambulatory Visit
Admission: RE | Admit: 2017-07-17 | Discharge: 2017-07-17 | Disposition: A | Discharge status: Home / Self Care | Payer: Medicare Other | Source: Ambulatory Visit | Attending: Pain Medicine | Admitting: Pain Medicine

## 2017-07-17 VITALS — BP 184/77 | HR 57 | Temp 97.4°F | Resp 18 | Ht 65.0 in | Wt 189.0 lb

## 2017-07-17 DIAGNOSIS — M47817 Spondylosis without myelopathy or radiculopathy, lumbosacral region: Secondary | ICD-10-CM

## 2017-07-17 DIAGNOSIS — M47816 Spondylosis without myelopathy or radiculopathy, lumbar region: Secondary | ICD-10-CM | POA: Insufficient documentation

## 2017-07-17 DIAGNOSIS — M545 Low back pain, unspecified: Secondary | ICD-10-CM

## 2017-07-17 DIAGNOSIS — G8929 Other chronic pain: Secondary | ICD-10-CM | POA: Diagnosis not present

## 2017-07-17 DIAGNOSIS — M533 Sacrococcygeal disorders, not elsewhere classified: Secondary | ICD-10-CM | POA: Diagnosis present

## 2017-07-17 MED ORDER — MIDAZOLAM HCL 5 MG/5ML IJ SOLN
1.0000 mg | INTRAMUSCULAR | Status: DC | PRN
  Administered 2017-07-17: 2 mg via INTRAVENOUS
  Filled 2017-07-17: qty 5

## 2017-07-17 MED ORDER — FENTANYL CITRATE (PF) 100 MCG/2ML IJ SOLN
25.0000 ug | INTRAMUSCULAR | Status: DC | PRN
  Administered 2017-07-17: 50 ug via INTRAVENOUS
  Filled 2017-07-17: qty 2

## 2017-07-17 MED ORDER — LACTATED RINGERS IV SOLN
1000.0000 mL | Freq: Once | INTRAVENOUS | Status: AC
  Administered 2017-07-17: 1000 mL via INTRAVENOUS

## 2017-07-17 MED ORDER — TRIAMCINOLONE ACETONIDE 40 MG/ML IJ SUSP
40.0000 mg | Freq: Once | INTRAMUSCULAR | Status: AC
  Administered 2017-07-17: 40 mg
  Filled 2017-07-17: qty 1

## 2017-07-17 MED ORDER — METHYLPREDNISOLONE ACETATE 80 MG/ML IJ SUSP
80.0000 mg | Freq: Once | INTRAMUSCULAR | Status: AC
  Administered 2017-07-17: 80 mg via INTRA_ARTICULAR
  Filled 2017-07-17: qty 1

## 2017-07-17 MED ORDER — LIDOCAINE HCL 2 % IJ SOLN
20.0000 mL | Freq: Once | INTRAMUSCULAR | Status: AC
  Administered 2017-07-17: 400 mg
  Filled 2017-07-17: qty 40

## 2017-07-17 MED ORDER — ROPIVACAINE HCL 2 MG/ML IJ SOLN
4.0000 mL | Freq: Once | INTRAMUSCULAR | Status: AC
  Administered 2017-07-17: 10 mL via INTRA_ARTICULAR
  Filled 2017-07-17: qty 10

## 2017-07-17 MED ORDER — ROPIVACAINE HCL 2 MG/ML IJ SOLN
9.0000 mL | Freq: Once | INTRAMUSCULAR | Status: AC
  Administered 2017-07-17: 9 mL via PERINEURAL
  Filled 2017-07-17: qty 10

## 2017-07-17 NOTE — Patient Instructions (Addendum)
____________________________________________________________________________________________  Post-Procedure instructions Instructions:  Apply ice: Fill a plastic sandwich bag with crushed ice. Cover it with a small towel and apply to injection site. Apply for 15 minutes then remove x 15 minutes. Repeat sequence on day of procedure, until you go to bed. The purpose is to minimize swelling and discomfort after procedure.  Apply heat: Apply heat to procedure site starting the day following the procedure. The purpose is to treat any soreness and discomfort from the procedure.  Food intake: Start with clear liquids (like water) and advance to regular food, as tolerated.   Physical activities: Keep activities to a minimum for the first 8 hours after the procedure.   Driving: If you have received any sedation, you are not allowed to drive for 24 hours after your procedure.  Blood thinner: Restart your blood thinner 6 hours after your procedure. (Only for those taking blood thinners)  Insulin: As soon as you can eat, you may resume your normal dosing schedule. (Only for those taking insulin)  Infection prevention: Keep procedure site clean and dry.  Post-procedure Pain Diary: Extremely important that this be done correctly and accurately. Recorded information will be used to determine the next step in treatment.  Pain evaluated is that of treated area only. Do not include pain from an untreated area.  Complete every hour, on the hour, for the initial 8 hours. Set an alarm to help you do this part accurately.  Do not go to sleep and have it completed later. It will not be accurate.  Follow-up appointment: Keep your follow-up appointment after the procedure. Usually 2 weeks for most procedures. (6 weeks in the case of radiofrequency.) Bring you pain diary.  Expect:  From numbing medicine (AKA: Local Anesthetics): Numbness or decrease in pain.  Onset: Full effect within 15 minutes of  injected.  Duration: It will depend on the type of local anesthetic used. On the average, 1 to 8 hours.   From steroids: Decrease in swelling or inflammation. Once inflammation is improved, relief of the pain will follow.  Onset of benefits: Depends on the amount of swelling present. The more swelling, the longer it will take for the benefits to be seen. In some cases, up to 10 days.  Duration: Steroids will stay in the system x 2 weeks. Duration of benefits will depend on multiple posibilities including persistent irritating factors.  From procedure: Some discomfort is to be expected once the numbing medicine wears off. This should be minimal if ice and heat are applied as instructed. Call if:  You experience numbness and weakness that gets worse with time, as opposed to wearing off.  New onset bowel or bladder incontinence. (Spinal procedures only)  Emergency Numbers:  Durning business hours (Monday - Thursday, 8:00 AM - 4:00 PM) (Friday, 9:00 AM - 12:00 Noon): [phone]  After hours: [phone] ____________________________________________________________________________________________   ____________________________________________________________________________________________  Post-Procedure instructions Instructions:  Apply ice: Fill a plastic sandwich bag with crushed ice. Cover it with a small towel and apply to injection site. Apply for 15 minutes then remove x 15 minutes. Repeat sequence on day of procedure, until you go to bed. The purpose is to minimize swelling and discomfort after procedure.  Apply heat: Apply heat to procedure site starting the day following the procedure. The purpose is to treat any soreness and discomfort from the procedure.  Food intake: Start with clear liquids (like water) and advance to regular food, as tolerated.   Physical activities: Keep activities to  a minimum for the first 8 hours after the procedure.   Driving: If you have  received any sedation, you are not allowed to drive for 24 hours after your procedure.  Blood thinner: Restart your blood thinner 6 hours after your procedure. (Only for those taking blood thinners)  Insulin: As soon as you can eat, you may resume your normal dosing schedule. (Only for those taking insulin)  Infection prevention: Keep procedure site clean and dry.  Post-procedure Pain Diary: Extremely important that this be done correctly and accurately. Recorded information will be used to determine the next step in treatment.  Pain evaluated is that of treated area only. Do not include pain from an untreated area.  Complete every hour, on the hour, for the initial 8 hours. Set an alarm to help you do this part accurately.  Do not go to sleep and have it completed later. It will not be accurate.  Follow-up appointment: Keep your follow-up appointment after the procedure. Usually 2 weeks for most procedures. (6 weeks in the case of radiofrequency.) Bring you pain diary.  Expect:  From numbing medicine (AKA: Local Anesthetics): Numbness or decrease in pain.  Onset: Full effect within 15 minutes of injected.  Duration: It will depend on the type of local anesthetic used. On the average, 1 to 8 hours.   From steroids: Decrease in swelling or inflammation. Once inflammation is improved, relief of the pain will follow.  Onset of benefits: Depends on the amount of swelling present. The more swelling, the longer it will take for the benefits to be seen. In some cases, up to 10 days.  Duration: Steroids will stay in the system x 2 weeks. Duration of benefits will depend on multiple posibilities including persistent irritating factors.  From procedure: Some discomfort is to be expected once the numbing medicine wears off. This should be minimal if ice and heat are applied as instructed. Call if:  You experience numbness and weakness that gets worse with time, as opposed to wearing  off.  New onset bowel or bladder incontinence. (Spinal procedures only)  Emergency Numbers:  Durning business hours (Monday - Thursday, 8:00 AM - 4:00 PM) (Friday, 9:00 AM - 12:00 Noon): [phone]  After hours: [phone] ____________________________________________________________________________________________   GENERAL RISKS AND COMPLICATIONS  What are the risk, side effects and possible complications? Generally speaking, most procedures are safe.  However, with any procedure there are risks, side effects, and the possibility of complications.  The risks and complications are dependent upon the sites that are lesioned, or the type of nerve block to be performed.  The closer the procedure is to the spine, the more serious the risks are.  Great care is taken when placing the radio frequency needles, block needles or lesioning probes, but sometimes complications can occur. 1. Infection: Any time there is an injection through the skin, there is a risk of infection.  This is why sterile conditions are used for these blocks.  There are four possible types of infection. 1. Localized skin infection. 2. Central Nervous System Infection-This can be in the form of Meningitis, which can be deadly. 3. Epidural Infections-This can be in the form of an epidural abscess, which can cause pressure inside of the spine, causing compression of the spinal cord with subsequent paralysis. This would require an emergency surgery to decompress, and there are no guarantees that the patient would recover from the paralysis. 4. Discitis-This is an infection of the intervertebral discs.  It occurs  in about 1% of discography procedures.  It is difficult to treat and it may lead to surgery.        2. Pain: the needles have to go through skin and soft tissues, will cause soreness.       3. Damage to internal structures:  The nerves to be lesioned may be near blood vessels or    other nerves which can be  potentially damaged.       4. Bleeding: Bleeding is more common if the patient is taking blood thinners such as  aspirin, Coumadin, Ticiid, Plavix, etc., or if he/she have some genetic predisposition  such as hemophilia. Bleeding into the spinal canal can cause compression of the spinal  cord with subsequent paralysis.  This would require an emergency surgery to  decompress and there are no guarantees that the patient would recover from the  paralysis.       5. Pneumothorax:  Puncturing of a lung is a possibility, every time a needle is introduced in  the area of the chest or upper back.  Pneumothorax refers to free air around the  collapsed lung(s), inside of the thoracic cavity (chest cavity).  Another two possible  complications related to a similar event would include: Hemothorax and Chylothorax.   These are variations of the Pneumothorax, where instead of air around the collapsed  lung(s), you may have blood or chyle, respectively.       6. Spinal headaches: They may occur with any procedures in the area of the spine.       7. Persistent CSF (Cerebro-Spinal Fluid) leakage: This is a rare problem, but may occur  with prolonged intrathecal or epidural catheters either due to the formation of a fistulous  track or a dural tear.       8. Nerve damage: By working so close to the spinal cord, there is always a possibility of  nerve damage, which could be as serious as a permanent spinal cord injury with  paralysis.       9. Death:  Although rare, severe deadly allergic reactions known as "Anaphylactic  reaction" can occur to any of the medications used.      10. Worsening of the symptoms:  We can always make thing worse.  What are the chances of something like this happening? Chances of any of this occuring are extremely low.  By statistics, you have more of a chance of getting killed in a motor vehicle accident: while driving to the hospital than any of the above occurring .  Nevertheless, you should be  aware that they are possibilities.  In general, it is similar to taking a shower.  Everybody knows that you can slip, hit your head and get killed.  Does that mean that you should not shower again?  Nevertheless always keep in mind that statistics do not mean anything if you happen to be on the wrong side of them.  Even if a procedure has a 1 (one) in a 1,000,000 (million) chance of going wrong, it you happen to be that one..Also, keep in mind that by statistics, you have more of a chance of having something go wrong when taking medications.  Who should not have this procedure? If you are on a blood thinning medication (e.g. Coumadin, Plavix, see list of "Blood Thinners"), or if you have an active infection going on, you should not have the procedure.  If you are taking any blood thinners, please inform your physician.  How  should I prepare for this procedure?  Do not eat or drink anything at least six hours prior to the procedure.  Bring a driver with you .  It cannot be a taxi.  Come accompanied by an adult that can drive you back, and that is strong enough to help you if your legs get weak or numb from the local anesthetic.  Take all of your medicines the morning of the procedure with just enough water to swallow them.  If you have diabetes, make sure that you are scheduled to have your procedure done first thing in the morning, whenever possible.  If you have diabetes, take only half of your insulin dose and notify our nurse that you have done so as soon as you arrive at the clinic.  If you are diabetic, but only take blood sugar pills (oral hypoglycemic), then do not take them on the morning of your procedure.  You may take them after you have had the procedure.  Do not take aspirin or any aspirin-containing medications, at least eleven (11) days prior to the procedure.  They may prolong bleeding.  Wear loose fitting clothing that may be easy to take off and that you would not mind if it  got stained with Betadine or blood.  Do not wear any jewelry or perfume  Remove any nail coloring.  It will interfere with some of our monitoring equipment.  NOTE: Remember that this is not meant to be interpreted as a complete list of all possible complications.  Unforeseen problems may occur.  BLOOD THINNERS The following drugs contain aspirin or other products, which can cause increased bleeding during surgery and should not be taken for 2 weeks prior to and 1 week after surgery.  If you should need take something for relief of minor pain, you may take acetaminophen which is found in Tylenol,m Datril, Anacin-3 and Panadol. It is not blood thinner. The products listed below are.  Do not take any of the products listed below in addition to any listed on your instruction sheet.  A.P.C or A.P.C with Codeine Codeine Phosphate Capsules #3 Ibuprofen Ridaura  ABC compound Congesprin Imuran rimadil  Advil Cope Indocin Robaxisal  Alka-Seltzer Effervescent Pain Reliever and Antacid Coricidin or Coricidin-D  Indomethacin Rufen  Alka-Seltzer plus Cold Medicine Cosprin Ketoprofen S-A-C Tablets  Anacin Analgesic Tablets or Capsules Coumadin Korlgesic Salflex  Anacin Extra Strength Analgesic tablets or capsules CP-2 Tablets Lanoril Salicylate  Anaprox Cuprimine Capsules Levenox Salocol  Anexsia-D Dalteparin Magan Salsalate  Anodynos Darvon compound Magnesium Salicylate Sine-off  Ansaid Dasin Capsules Magsal Sodium Salicylate  Anturane Depen Capsules Marnal Soma  APF Arthritis pain formula Dewitt's Pills Measurin Stanback  Argesic Dia-Gesic Meclofenamic Sulfinpyrazone  Arthritis Bayer Timed Release Aspirin Diclofenac Meclomen Sulindac  Arthritis pain formula Anacin Dicumarol Medipren Supac  Analgesic (Safety coated) Arthralgen Diffunasal Mefanamic Suprofen  Arthritis Strength Bufferin Dihydrocodeine Mepro Compound Suprol  Arthropan liquid Dopirydamole Methcarbomol with Aspirin Synalgos  ASA  tablets/Enseals Disalcid Micrainin Tagament  Ascriptin Doan's Midol Talwin  Ascriptin A/D Dolene Mobidin Tanderil  Ascriptin Extra Strength Dolobid Moblgesic Ticlid  Ascriptin with Codeine Doloprin or Doloprin with Codeine Momentum Tolectin  Asperbuf Duoprin Mono-gesic Trendar  Aspergum Duradyne Motrin or Motrin IB Triminicin  Aspirin plain, buffered or enteric coated Durasal Myochrisine Trigesic  Aspirin Suppositories Easprin Nalfon Trillsate  Aspirin with Codeine Ecotrin Regular or Extra Strength Naprosyn Uracel  Atromid-S Efficin Naproxen Ursinus  Auranofin Capsules Elmiron Neocylate Vanquish  Axotal Emagrin Norgesic Verin  Azathioprine Empirin  or Empirin with Codeine Normiflo Vitamin E  Azolid Emprazil Nuprin Voltaren  Bayer Aspirin plain, buffered or children's or timed BC Tablets or powders Encaprin Orgaran Warfarin Sodium  Buff-a-Comp Enoxaparin Orudis Zorpin  Buff-a-Comp with Codeine Equegesic Os-Cal-Gesic   Buffaprin Excedrin plain, buffered or Extra Strength Oxalid   Bufferin Arthritis Strength Feldene Oxphenbutazone   Bufferin plain or Extra Strength Feldene Capsules Oxycodone with Aspirin   Bufferin with Codeine Fenoprofen Fenoprofen Pabalate or Pabalate-SF   Buffets II Flogesic Panagesic   Buffinol plain or Extra Strength Florinal or Florinal with Codeine Panwarfarin   Buf-Tabs Flurbiprofen Penicillamine   Butalbital Compound Four-way cold tablets Penicillin   Butazolidin Fragmin Pepto-Bismol   Carbenicillin Geminisyn Percodan   Carna Arthritis Reliever Geopen Persantine   Carprofen Gold's salt Persistin   Chloramphenicol Goody's Phenylbutazone   Chloromycetin Haltrain Piroxlcam   Clmetidine heparin Plaquenil   Cllnoril Hyco-pap Ponstel   Clofibrate Hydroxy chloroquine Propoxyphen         Before stopping any of these medications, be sure to consult the physician who ordered them.  Some, such as Coumadin (Warfarin) are ordered to prevent or treat serious conditions  such as "deep thrombosis", "pumonary embolisms", and other heart problems.  The amount of time that you may need off of the medication may also vary with the medication and the reason for which you were taking it.  If you are taking any of these medications, please make sure you notify your pain physician before you undergo any procedures.

## 2017-07-17 NOTE — Progress Notes (Signed)
Patient's Name: [name]  MRN: [id number]  Referring Provider: Wenda Low, MD  DOB: 03-27-1941  PCP: Wenda Low, MD  DOS: 07/17/2017  Note by: Gaspar Cola, MD  Service setting: Ambulatory outpatient  Specialty: Interventional Pain Management  Patient type: Established  Location: [facility] (AMB) Pain Management Facility  Visit type: Interventional Procedure   Primary Reason for Visit: Interventional Pain Management Treatment. CC: Back Pain (low back and hip right side)  Procedure:       Anesthesia, Analgesia, Anxiolysis:  Procedure #1: Type: Medial Branch Facet Block #2 Primary Purpose: Diagnostic Region: Lumbar Level: L2, L3, L4, L5, & S1 Medial Branch Level(s) Target Area: For Lumbar Facet blocks, the target is the groove formed by the junction of the transverse process and superior articular process. For the L5 dorsal ramus, the target is the notch between superior articular process and sacral ala. For the S1 dorsal ramus, the target is the superior and lateral edge of the posterior S1 Sacral foramen. Approach: Posterior, paramedial, percutaneous approach. Laterality: Right Position: Prone  Procedure #2: Type: Sacroiliac Joint Block #2  Primary Purpose: Diagnostic Region: Posterior Lumbosacral Level: PSIS (Posterior Superior Iliac Spine) Sacroiliac Joint Target Area: For upper sacroiliac joint block(s), the target is the superior and posterior margin of the sacroiliac joint. Approach: Ipsilateral approach. Laterality: Right Position: Prone  Type: Local Anesthesia with Moderate (Conscious) Sedation Local Anesthetic: Lidocaine 1% Route: Intravenous (IV) IV Access: Secured Sedation: Meaningful verbal contact was maintained at all times during the procedure  Indication(s): Analgesia and Anxiety   Indications: 1. Spondylosis without myelopathy or radiculopathy, lumbosacral region   2. 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 Chronic sacroiliac joint pain  (Right)   4. Chronic low back pain [facility] Area of Pain) (Bilateral) (L>R)    Pain Score: Pre-procedure: 5 /10 Post-procedure: 2 /10  Pre-op Assessment:  [name] is a 77 y.o. (year old), female patient, seen today for interventional treatment. She  has a past surgical history that includes Abdominal surgery; Colon surgery; Joint replacement; Appendectomy; Bladder surgery; Rectocele repair; Knee surgery; Cholecystectomy; Abdominal hysterectomy; Ostomy; Eye surgery; Intramedullary (im) nail intertrochanteric (Right, 09/13/2016); and Hip surgery (Right, 08/2016). [name] has a current medication list which includes the following prescription(s): albuterol, albuterol, alendronate, alprazolam, amlodipine, calcium, citalopram, docusate sodium, esomeprazole, ferrous gluconate, ginkgo biloba, krill oil, lactose free nutrition, linaclotide, lovastatin, meloxicam, menthol (topical analgesic), multiple vitamins-minerals, ondansetron, orphenadrine, polyethylene glycol, promethazine, senna, trazodone, vitamin b-12, baclofen, diclofenac sodium, gabapentin, hydrocodone-acetaminophen, and methylprednisolone, and the following Facility-Administered Medications: fentanyl and midazolam. Her primarily concern today is the Back Pain (low back and hip right side)  Initial Vital Signs:  Pulse Rate: (!) 57 Temp: 98 F (36.7 C) Resp: 20 BP: (!) 160/87 SpO2: 96 %  BMI: Estimated body mass index is 31.45 kg/m as calculated from the following:   Height as of this encounter: 5\' 5"  (1.651 m).   Weight as of this encounter: 189 lb (85.7 kg).  Risk Assessment: Allergies: Reviewed. She is allergic to ampicillin; ampicillin-sulbactam sodium; toradol [ketorolac tromethamine]; tramadol; flexeril [cyclobenzaprine]; ambien [zolpidem tartrate]; cephalexin; percocet [oxycodone-acetaminophen]; sulfamethoxazole-trimethoprim; and tape.  Allergy Precautions: None required Coagulopathies: Reviewed. None identified.   Blood-thinner therapy: None at this time Active Infection(s): Reviewed. None identified. [name] is afebrile  Site Confirmation: [name] was asked to confirm the procedure and laterality before marking the site Procedure checklist: Completed Consent: Before the procedure and under the influence of no sedative(s), amnesic(s), or anxiolytics, the patient was informed  of the treatment options, risks and possible complications. To fulfill our ethical and legal obligations, as recommended by the American Medical Association's Code of Ethics, I have informed the patient of my clinical impression; the nature and purpose of the treatment or procedure; the risks, benefits, and possible complications of the intervention; the alternatives, including doing nothing; the risk(s) and benefit(s) of the alternative treatment(s) or procedure(s); and the risk(s) and benefit(s) of doing nothing. The patient was provided information about the general risks and possible complications associated with the procedure. These may include, but are not limited to: failure to achieve desired goals, infection, bleeding, organ or nerve damage, allergic reactions, paralysis, and death. In addition, the patient was informed of those risks and complications associated to Spine-related procedures, such as failure to decrease pain; infection (i.e.: Meningitis, epidural or intraspinal abscess); bleeding (i.e.: epidural hematoma, subarachnoid hemorrhage, or any other type of intraspinal or peri-dural bleeding); organ or nerve damage (i.e.: Any type of peripheral nerve, nerve root, or spinal cord injury) with subsequent damage to sensory, motor, and/or autonomic systems, resulting in permanent pain, numbness, and/or weakness of one or several areas of the body; allergic reactions; (i.e.: anaphylactic reaction); and/or death. Furthermore, the patient was informed of those risks and complications associated with the medications. These  include, but are not limited to: allergic reactions (i.e.: anaphylactic or anaphylactoid reaction(s)); adrenal axis suppression; blood sugar elevation that in diabetics may result in ketoacidosis or comma; water retention that in patients with history of congestive heart failure may result in shortness of breath, pulmonary edema, and decompensation with resultant heart failure; weight gain; swelling or edema; medication-induced neural toxicity; particulate matter embolism and blood vessel occlusion with resultant organ, and/or nervous system infarction; and/or aseptic necrosis of one or more joints. Finally, the patient was informed that Medicine is not an exact science; therefore, there is also the possibility of unforeseen or unpredictable risks and/or possible complications that may result in a catastrophic outcome. The patient indicated having understood very clearly. We have given the patient no guarantees and we have made no promises. Enough time was given to the patient to ask questions, all of which were answered to the patient's satisfaction. Ms. Esh has indicated that she wanted to continue with the procedure. Attestation: I, the ordering provider, attest that I have discussed with the patient the benefits, risks, side-effects, alternatives, likelihood of achieving goals, and potential problems during recovery for the procedure that I have provided informed consent. Date  Time: 07/17/2017  9:08 AM  Pre-Procedure Preparation:  Monitoring: As per clinic protocol. Respiration, ETCO2, SpO2, BP, heart rate and rhythm monitor placed and checked for adequate function Safety Precautions: Patient was assessed for positional comfort and pressure points before starting the procedure. Time-out: I initiated and conducted the "Time-out" before starting the procedure, as per protocol. The patient was asked to participate by confirming the accuracy of the "Time Out" information. Verification of the correct  person, site, and procedure were performed and confirmed by me, the nursing staff, and the patient. "Time-out" conducted as per Joint Commission's Universal Protocol (UP.01.01.01). Time: 1034  Description of Procedure #1 Process:   Time-out: "Time-out" completed before starting procedure, as per protocol. Area Prepped: Entire Posterior Lumbosacral Region Prepping solution: ChloraPrep (2% chlorhexidine gluconate and 70% isopropyl alcohol) Safety Precautions: Aspiration looking for blood return was conducted prior to all injections. At no point did we inject any substances, as a needle was being advanced. No attempts were made at seeking any paresthesias. Safe  injection practices and needle disposal techniques used. Medications properly checked for expiration dates. SDV (single dose vial) medications used.  Description of the Procedure: Protocol guidelines were followed. The patient was placed in position over the fluoroscopy table. The target area was identified and the area prepped in the usual manner. Skin desensitized using vapocoolant spray. Skin & deeper tissues infiltrated with local anesthetic. Appropriate amount of time allowed to pass for local anesthetics to take effect. The procedure needle was introduced through the skin, ipsilateral to the reported pain, and advanced to the target area. Employing the "Medial Branch Technique", the needles were advanced to the angle made by the superior and medial portion of the transverse process, and the lateral and inferior portion of the superior articulating process of the targeted vertebral bodies. This area is known as "Burton's Eye" or the "Eye of the [place] Dog". A procedure needle was introduced through the skin, and this time advanced to the angle made by the superior and medial border of the sacral ala, and the lateral border of the S1 vertebral body. This last needle was later repositioned at the superior and lateral border of the posterior S1  foramen. Negative aspiration confirmed. Solution injected in intermittent fashion, asking for systemic symptoms every 0.5cc of injectate. The needles were then removed and the area cleansed, making sure to leave some of the prepping solution back to take advantage of its long term bactericidal properties. Start Time: 1035 hrs. Materials:  Needle(s) Type: Regular needle Gauge: 22G Length: 3.5-in Medication(s): Please see orders for medications and dosing details.  Description of Procedure # 2 Process:   Area Prepped: Entire Posterior Lumbosacral Region Prepping solution: ChloraPrep (2% chlorhexidine gluconate and 70% isopropyl alcohol) Safety Precautions: Aspiration looking for blood return was conducted prior to all injections. At no point did we inject any substances, as a needle was being advanced. No attempts were made at seeking any paresthesias. Safe injection practices and needle disposal techniques used. Medications properly checked for expiration dates. SDV (single dose vial) medications used. Description of the Procedure: Protocol guidelines were followed. The patient was placed in position over the fluoroscopy table. The target area was identified and the area prepped in the usual manner. Skin desensitized using vapocoolant spray. Skin & deeper tissues infiltrated with local anesthetic. Appropriate amount of time allowed to pass for local anesthetics to take effect. The procedure needle was advanced under fluoroscopic guidance into the sacroiliac joint until a firm endpoint was obtained. Proper needle placement secured. Negative aspiration confirmed. Solution injected in intermittent fashion, asking for systemic symptoms every 0.5cc of injectate. The needles were then removed and the area cleansed, making sure to leave some of the prepping solution back to take advantage of its long term bactericidal properties. Vitals:   07/17/17 1042 07/17/17 1049 07/17/17 1059 07/17/17 1109  BP: (!)  130/104 (!) 153/107 (!) 159/89 (!) 184/77  Pulse:      Resp: 17 17 18 18   Temp:  98.4 F (36.9 C)  (!) 97.4 F (36.3 C)  TempSrc:      SpO2: 92% 90% 94% 93%  Weight:      Height:        End Time: 1041 hrs. Materials:  Needle(s) Type: Regular needle Gauge: 22G Length: 3.5-in Medication(s): Please see orders for medications and dosing details.  Imaging Guidance (Spinal):  Type of Imaging Technique: Fluoroscopy Guidance (Spinal) Indication(s): Assistance in needle guidance and placement for procedures requiring needle placement in or near specific anatomical locations not  easily accessible without such assistance. Exposure Time: Please see nurses notes. Contrast: None used. Fluoroscopic Guidance: I was personally present during the use of fluoroscopy. "Tunnel Vision Technique" used to obtain the best possible view of the target area. Parallax error corrected before commencing the procedure. "Direction-depth-direction" technique used to introduce the needle under continuous pulsed fluoroscopy. Once target was reached, antero-posterior, oblique, and lateral fluoroscopic projection used confirm needle placement in all planes. Images permanently stored in EMR. Interpretation: No contrast injected. I personally interpreted the imaging intraoperatively. Adequate needle placement confirmed in multiple planes. Permanent images saved into the patient's record.  Antibiotic Prophylaxis:   Anti-infectives (From admission, onward)   None     Indication(s): None identified  Post-operative Assessment:  Post-procedure Vital Signs:  Pulse Rate: (!) 57 Temp: (!) 97.4 F (36.3 C) Resp: 18 BP: (!) 184/77 SpO2: 93 %  EBL: None  Complications: No immediate post-treatment complications observed by team, or reported by patient.  Note: The patient tolerated the entire procedure well. A repeat set of vitals were taken after the procedure and the patient was kept under observation following  institutional policy, for this type of procedure. Post-procedural neurological assessment was performed, showing return to baseline, prior to discharge. The patient was provided with post-procedure discharge instructions, including a section on how to identify potential problems. Should any problems arise concerning this procedure, the patient was given instructions to immediately contact us, at any time, without hesitation. In any case, we plan to contact the patient by telephone for a follow-up status report regarding this interventional procedure.  Comments:  No additional relevant information.  Plan of Care    Imaging Orders     DG C-Arm 1-60 Min-No Report  Procedure Orders     LUMBAR FACET(MEDIAL BRANCH NERVE BLOCK) MBNB     SACROILIAC JOINT INJECTION  Medications ordered for procedure: Meds ordered this encounter  Medications  . lidocaine (XYLOCAINE) 2 % (with pres) injection 400 mg  . ropivacaine (PF) 2 mg/mL (0.2%) (NAROPIN) injection 9 mL  . triamcinolone acetonide (KENALOG-40) injection 40 mg  . ropivacaine (PF) 2 mg/mL (0.2%) (NAROPIN) injection 4 mL  . methylPREDNISolone acetate (DEPO-MEDROL) injection 80 mg  . midazolam (VERSED) 5 MG/5ML injection 1-2 mg    Make sure Flumazenil is available in the pyxis when using this medication. If oversedation occurs, administer 0.2 mg IV over 15 sec. If after 45 sec no response, administer 0.2 mg again over 1 min; may repeat at 1 min intervals; not to exceed 4 doses (1 mg)  . fentaNYL (SUBLIMAZE) injection 25-50 mcg    Make sure Narcan is available in the pyxis when using this medication. In the event of respiratory depression (RR< 8/min): Titrate NARCAN (naloxone) in increments of 0.1 to 0.2 mg IV at 2-3 minute intervals, until desired degree of reversal.  . lactated ringers infusion 1,000 mL   Medications administered: We administered lidocaine, ropivacaine (PF) 2 mg/mL (0.2%), triamcinolone acetonide, ropivacaine (PF) 2 mg/mL (0.2%),  methylPREDNISolone acetate, midazolam, fentaNYL, and lactated ringers.  See the medical record for exact dosing, route, and time of administration.  New Prescriptions   No medications on file   Disposition: Discharge home  Discharge Date & Time: 07/17/2017; 1111 hrs.   Physician-requested Follow-up: Return for post-procedure eval (2 wks), w/ Dr. Dossie Arbour.  Future Appointments  Date Time Provider [place]  07/22/2017 10:15 AM [facility] PAT 4 [facility] None  08/04/2017  1:30 PM Milinda Pointer, MD [facility] None  08/08/2017 10:30 AM Janna Arch,  PT [facility] None  08/11/2017 11:00 AM Janna Arch, PT [facility] None  08/14/2017  3:15 PM [place], Minette Headland S, PT [facility] None  08/18/2017 11:00 AM Janna Arch, PT [facility] None  08/20/2017  3:15 PM Janna Arch, PT [facility] None  08/25/2017 11:00 AM Janna Arch, PT [facility] None  08/27/2017 11:00 AM Janna Arch, PT [facility] None  09/01/2017 11:00 AM Janna Arch, PT [facility] None  09/03/2017 11:15 AM Janna Arch, PT [facility] None  09/08/2017 11:15 AM Janna Arch, PT [facility] None  09/10/2017 11:15 AM Janna Arch, PT [facility] None  09/15/2017 11:15 AM Janna Arch, PT [facility] None  09/17/2017 11:15 AM Janna Arch, PT [facility] None  09/22/2017 11:15 AM Janna Arch, PT [facility] None  09/24/2017 11:15 AM Janna Arch, PT [facility] None  09/29/2017 11:15 AM Janna Arch, PT [facility] None  10/01/2017 11:15 AM Janna Arch, PT [facility] None  10/06/2017 11:15 AM Janna Arch, PT [facility] None  10/08/2017 11:15 AM Janna Arch, PT [facility] None   Primary Care Physician: Wenda Low, MD Location: [facility] Note by: Gaspar Cola, MD Date: 07/17/2017; Time: 11:17 AM  Disclaimer:  Medicine is not an Chief Strategy Officer. The only guarantee in medicine is that nothing is guaranteed. It is important to note that the decision to proceed with this intervention was based on the information  collected from the patient. The Data and conclusions were drawn from the patient's questionnaire, the interview, and the physical examination. Because the information was provided in large part by the patient, it cannot be guaranteed that it has not been purposely or unconsciously manipulated. Every effort has been made to obtain as much relevant data as possible for this evaluation. It is important to note that the conclusions that lead to this procedure are derived in large part from the available data. Always take into account that the treatment will also be dependent on availability of resources and existing treatment guidelines, considered by other Pain Management Practitioners as being common knowledge and practice, at the time of the intervention. For Medico-Legal purposes, it is also important to point out that variation in procedural techniques and pharmacological choices are the acceptable norm. The indications, contraindications, technique, and results of the above procedure should only be interpreted and judged by a Board-Certified Interventional Pain Specialist with extensive familiarity and expertise in the same exact procedure and technique.

## 2017-07-17 NOTE — Progress Notes (Signed)
Safety precautions to be maintained throughout the outpatient stay will include: orient to surroundings, keep bed in low position, maintain call bell within reach at all times, provide assistance with transfer out of bed and ambulation.  

## 2017-07-18 ENCOUNTER — Telehealth: Payer: Self-pay

## 2017-07-18 NOTE — Telephone Encounter (Signed)
Post procedure phone call.  Left message.  

## 2017-07-21 ENCOUNTER — Encounter: Payer: Medicare Other | Admitting: Physical Therapy

## 2017-07-21 ENCOUNTER — Ambulatory Visit: Payer: Medicare Other | Admitting: Pain Medicine

## 2017-07-21 NOTE — Pre-Procedure Instructions (Signed)
[name]  07/21/2017      [facility] - [name], [name] - [address], [address], [address] [name] [address] Phone: [phone] Fax: [phone]  [id number] - [name], [name] [address] Phone: [phone] Fax: [phone]    Your procedure is scheduled on July 28, 2017.  Report to [name] Admitting at 900 AM.  Call this number if you have problems the morning of surgery:  [phone]   Remember:  Do not eat food or drink liquids after midnight.  Take these medicines the morning of surgery with A SIP OF WATER albuterol inhaler (bring inhalers with you), albuterol nebulizer-if needed, amlodipine (norvasc), citalopram (celexa), esomeprazole (nexium), hydrocodone-acetaminophen (norco)-if needed, linaclotide (linzess).  7 days prior to surgery STOP taking any meloxicam (mobic), Aspirin (unless otherwise instructed by your surgeon), Aleve, Naproxen, Ibuprofen, Motrin, Advil, Goody's, BC's, all herbal medications, fish oil, and all vitamins  Continue all other medications as instructed by your physician except follow the above medication instructions before surgery   Do not wear jewelry, make-up or nail polish.  Do not wear lotions, powders, or perfumes, or deodorant.  Do not shave 48 hours prior to surgery.    Do not bring valuables to the hospital.  [facility] is not responsible for any belongings or valuables.  Contacts, dentures or bridgework may not be worn into surgery.  Leave your suitcase in the car.  After surgery it may be brought to your room.  For patients admitted to the hospital, discharge time will be determined by your treatment team.  Patients discharged the day of surgery will not be allowed to drive home.   Special instructions:  [name]- Preparing For Surgery  Before surgery, you can play an important role. Because skin is not sterile,  your skin needs to be as free of germs as possible. You can reduce the number of germs on your skin by washing with CHG (chlorahexidine gluconate) Soap before surgery.  CHG is an antiseptic cleaner which kills germs and bonds with the skin to continue killing germs even after washing.  Please do not use if you have an allergy to CHG or antibacterial soaps. If your skin becomes reddened/irritated stop using the CHG.  Do not shave (including legs and underarms) for at least 48 hours prior to first CHG shower. It is OK to shave your face.  Please follow these instructions carefully.   1. Shower the NIGHT BEFORE SURGERY and the MORNING OF SURGERY with CHG.   2. If you chose to wash your hair, wash your hair first as usual with your normal shampoo.  3. After you shampoo, rinse your hair and body thoroughly to remove the shampoo.  4. Use CHG as you would any other liquid soap. You can apply CHG directly to the skin and wash gently with a scrungie or a clean washcloth.   5. Apply the CHG Soap to your body ONLY FROM THE NECK DOWN.  Do not use on open wounds or open sores. Avoid contact with your eyes, ears, mouth and genitals (private parts). Wash Face and genitals (private parts)  with your normal soap.  6. Wash thoroughly, paying special attention to the area where your surgery will be performed.  7. Thoroughly rinse your body with warm water from the neck down.  8. DO NOT shower/wash with your normal soap after using and rinsing off the CHG  Soap.  9. Pat yourself dry with a CLEAN TOWEL.  10. Wear CLEAN PAJAMAS to bed the night before surgery, wear comfortable clothes the morning of surgery  11. Place CLEAN SHEETS on your bed the night of your first shower and DO NOT SLEEP WITH PETS.  Day of Surgery: Do not apply any deodorants/lotions. Please wear clean clothes to the hospital/surgery center.    Please read over the following fact sheets that you were given. Pain Booklet, Coughing and  Deep Breathing, MRSA Information and Surgical Site Infection Prevention

## 2017-07-22 ENCOUNTER — Inpatient Hospital Stay (HOSPITAL_COMMUNITY)
Admission: RE | Admit: 2017-07-22 | Discharge: 2017-07-22 | Disposition: A | Discharge status: Home / Self Care | Payer: Medicare Other | Source: Ambulatory Visit

## 2017-07-28 ENCOUNTER — Other Ambulatory Visit: Payer: Self-pay | Admitting: Orthopedic Surgery

## 2017-07-28 ENCOUNTER — Encounter (HOSPITAL_COMMUNITY): Admission: RE | Payer: Self-pay | Source: Ambulatory Visit

## 2017-07-28 ENCOUNTER — Inpatient Hospital Stay (HOSPITAL_COMMUNITY): Admission: RE | Admit: 2017-07-28 | Payer: Medicare Other | Source: Ambulatory Visit | Admitting: Orthopedic Surgery

## 2017-07-28 SURGERY — SYNOVECTOMY WITH POLY EXCHANGE
Anesthesia: Spinal | Laterality: Left

## 2017-08-04 ENCOUNTER — Encounter: Payer: Medicare Other | Admitting: Physical Therapy

## 2017-08-04 ENCOUNTER — Ambulatory Visit: Payer: Medicare Other | Admitting: Pain Medicine

## 2017-08-04 DIAGNOSIS — M5388 Other specified dorsopathies, sacral and sacrococcygeal region: Secondary | ICD-10-CM | POA: Insufficient documentation

## 2017-08-04 NOTE — Progress Notes (Deleted)
Patient's Name: [name]  MRN: [id number]  Referring Provider: Wenda Low, MD  DOB: 02-21-1941  PCP: Wenda Low, MD  DOS: 08/04/2017  Note by: Gaspar Cola, MD  Service setting: Ambulatory outpatient  Specialty: Interventional Pain Management  Location: [facility] (AMB) Pain Management Facility    Patient type: Established   Primary Reason(s) for Visit: Encounter for post-procedure evaluation of chronic illness with mild to moderate exacerbation CC: No chief complaint on file.  HPI  [name] is a 77 y.o. year old, female patient, who comes today for a post-procedure evaluation. 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[place]); Hypertensive kidney disease, malignant; Chronic low back pain (Tertiary Area of Pain) (Bilateral) (L>R); CAFL (chronic airflow limitation) ([place]); Apoplectic vertigo; Duodenogastric reflux; History of stroke; Hoarseness; Benign essential HTN; Chronic knee pain (Primary Area of Pain) (Right); Depression, major, in partial remission ([place]); Major depression in remission ([place]); Involutional osteoporosis; Pharyngoesophageal dysphagia; 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); Hx of total knee replacement (Bilateral); Infection of the upper respiratory tract; Bed wetting; UTI (urinary tract infection); Long term current use of opiate analgesic; Long term prescription opiate use; Chronic pain syndrome; Chronic neck pain (Secondary Area of Pain) (Bilateral) (L>R); Chronic hip pain (Right); Rib pain; DDD (degenerative disc disease), cervical; Lumbar facet arthropathy  (Bilateral); Chronic myofascial pain; Lumbar facet syndrome (Bilateral) (L>R); Long term prescription benzodiazepine use; Opiate use; Disorder of skeletal system; Pharmacologic therapy; Problems influencing health status; Opioid-induced constipation (OIC); NSAID long-term use; DDD (degenerative disc disease), lumbar; Chronic knee pain (Bilateral) (R>L); Osteoarthritis; Chronic musculoskeletal pain; Neurogenic pain; Chronic knee pain s/p total knee replacement (TKR) (Right); CKD (chronic kidney disease) stage 3, GFR 30-59 ml/min ([place]); Chronic shoulder pain (Left); Non-traumatic compression fracture of T3 thoracic vertebra, sequela; High risk medication use; At high risk for falls; Chronic sacroiliac joint pain (Right); Spondylosis without myelopathy or radiculopathy, lumbosacral region; and Other specified dorsopathies, sacral and sacrococcygeal region on their problem list. Her primarily concern today is the No chief complaint on file.  Pain Assessment: Location:     Radiating:   Onset:   Duration:   Quality:   Severity:  /10 (self-reported pain score)  Note: Reported level is compatible with observation.                         When using our objective Pain Scale, levels between 6 and 10/10 are said to belong in an emergency room, as it progressively worsens from a 6/10, described as severely limiting, requiring emergency care not usually available at an outpatient pain management facility. At a 6/10 level, communication becomes difficult and requires great effort. Assistance to reach the emergency department may be required. Facial flushing and profuse sweating along with potentially dangerous increases in heart rate and blood pressure will be evident. Effect on ADL:   Timing:   Modifying factors:    [name] comes in today for post-procedure evaluation after the treatment done on 07/17/2017.  Further details on both, my assessment(s), as well as the proposed treatment plan, please see  below.  Post-Procedure Assessment  07/17/2017 Procedure: Diagnostic right-sided lumbar facet block #2 + diagnostic right-sided sacroiliac joint block #2 under fluoroscopic guidance and IV  sedation Pre-procedure pain score:  5/10 Post-procedure pain score: 2/10 (> 50% relief) Influential Factors: BMI:   Intra-procedural challenges: None observed.         Assessment challenges: None detected.              Reported side-effects: None.        Post-procedural adverse reactions or complications: None reported         Sedation: Sedation provided. When no sedatives are used, the analgesic levels obtained are directly associated to the effectiveness of the local anesthetics. However, when sedation is provided, the level of analgesia obtained during the initial 1 hour following the intervention, is believed to be the result of a combination of factors. These factors may include, but are not limited to: 1. The effectiveness of the local anesthetics used. 2. The effects of the analgesic(s) and/or anxiolytic(s) used. 3. The degree of discomfort experienced by the patient at the time of the procedure. 4. The patients ability and reliability in recalling and recording the events. 5. The presence and influence of possible secondary gains and/or psychosocial factors. Reported result: Relief experienced during the 1st hour after the procedure:   (Ultra-Short Term Relief)            Interpretative annotation: Clinically appropriate result. Analgesia during this period is likely to be Local Anesthetic and/or IV Sedative (Analgesic/Anxiolytic) related.          Effects of local anesthetic: The analgesic effects attained during this period are directly associated to the localized infiltration of local anesthetics and therefore cary significant diagnostic value as to the etiological location, or anatomical origin, of the pain. Expected duration of relief is directly dependent on the pharmacodynamics of the local  anesthetic used. Long-acting (4-6 hours) anesthetics used.  Reported result: Relief during the next 4 to 6 hour after the procedure:   (Short-Term Relief)            Interpretative annotation: Clinically appropriate result. Analgesia during this period is likely to be Local Anesthetic-related.          Long-term benefit: Defined as the period of time past the expected duration of local anesthetics (1 hour for short-acting and 4-6 hours for long-acting). With the possible exception of prolonged sympathetic blockade from the local anesthetics, benefits during this period are typically attributed to, or associated with, other factors such as analgesic sensory neuropraxia, antiinflammatory effects, or beneficial biochemical changes provided by agents other than the local anesthetics.  Reported result: Extended relief following procedure:   (Long-Term Relief)            Interpretative annotation: Clinically appropriate result. Good relief. No permanent benefit expected. Inflammation plays a part in the etiology to the pain.          Current benefits: Defined as reported results that persistent at this point in time.   Analgesia: *** %            Function: Somewhat improved ROM: Somewhat improved Interpretative annotation: Recurrence of symptoms. No permanent benefit expected. Effective diagnostic intervention.          Interpretation: Results would suggest a successful diagnostic intervention.                  Plan:  Please see "Plan of Care" for details.                Laboratory Chemistry  Inflammation Markers (CRP: Acute Phase) (ESR: Chronic Phase) Lab Results  Component Value Date  CRP <0.8 11/19/2016   ESRSEDRATE 20 11/19/2016   LATICACIDVEN 0.98 10/07/2014                         Rheumatology Markers None reported  Renal Function Markers Lab Results  Component Value Date   BUN 7 (L) 12/04/2016   CREATININE 1.09 (H) 12/04/2016   GFRAA 57 (L) 12/04/2016   GFRNONAA 50 (L)  12/04/2016                 Hepatic Function Markers Lab Results  Component Value Date   AST 19 12/04/2016   ALT 15 12/04/2016   ALBUMIN 4.0 12/04/2016   ALKPHOS 104 12/04/2016   AMYLASE 68 01/25/2007   LIPASE 19 04/15/2016   AMMONIA 66 12/04/2016                 Electrolytes Lab Results  Component Value Date   NA 145 (H) 12/04/2016   K 4.0 12/04/2016   CL 107 (H) 12/04/2016   CALCIUM 9.4 12/04/2016   MG 2.0 11/19/2016                        Neuropathy Markers Lab Results  Component Value Date   VITAMINB12 1,888 (H) 11/19/2016   HGBA1C 5.6 05/22/2015                 Bone Pathology Markers Lab Results  Component Value Date   25OHVITD1 37 11/19/2016   25OHVITD2 <1.0 11/19/2016   25OHVITD3 37 11/19/2016                         Coagulation Parameters Lab Results  Component Value Date   INR 1.00 09/13/2016   LABPROT 13.2 09/13/2016   APTT 34 01/04/2011   PLT 263 12/04/2016   DDIMER 1.71 (H) 10/23/2016                 Cardiovascular Markers Lab Results  Component Value Date   BNP 60.2 01/23/2016   TROPONINI <0.03 05/23/2015   HGB 14.7 12/04/2016   HCT 44.8 12/04/2016                 CA Markers None reported   Note: Lab results reviewed.  Recent Diagnostic Imaging Results  DG C-Arm 1-60 Min-No Report Fluoroscopy was utilized by the requesting physician.  No radiographic  interpretation.   Complexity Note: I personally reviewed the fluoroscopic imaging of the procedure.                        Meds   Current Outpatient Medications:  .  albuterol (PROVENTIL HFA;VENTOLIN HFA) 108 (90 BASE) MCG/ACT inhaler, Inhale 2 puffs into the lungs every 6 (six) hours as needed for wheezing or shortness of breath. , Disp: , Rfl:  .  albuterol (PROVENTIL) (2.5 MG/3ML) 0.083% nebulizer solution, Take 2.5 mg by nebulization every 6 (six) hours as needed for wheezing or shortness of breath., Disp: , Rfl:  .  alendronate (FOSAMAX) 70 MG tablet, Take 70 mg by mouth every  Monday. Take with a full glass of water on an empty stomach., Disp: , Rfl:  .  ALPRAZolam (XANAX) 1 MG tablet, Take 1 mg by mouth at bedtime. , Disp: , Rfl:  .  amLODipine (NORVASC) 5 MG tablet, Take 5 mg by mouth daily. , Disp: , Rfl:  .  baclofen (LIORESAL) 10 MG tablet,  Take 1 tablet (10 mg total) by mouth daily. (Patient not taking: Reported on 07/15/2017), Disp: 30 tablet, Rfl: 2 .  CALCIUM PO, Take 1 tablet by mouth 2 (two) times daily., Disp: , Rfl:  .  citalopram (CELEXA) 20 MG tablet, Take 30 mg by mouth daily. , Disp: , Rfl:  .  diclofenac sodium (VOLTAREN) 1 % GEL, Apply 2 g topically 4 (four) times daily. (Patient taking differently: Apply 2 g topically 4 (four) times daily as needed (pain). ), Disp: 100 g, Rfl: 2 .  docusate sodium (COLACE) 100 MG capsule, Take 300 mg by mouth daily as needed for mild constipation. , Disp: , Rfl:  .  esomeprazole (NEXIUM) 40 MG capsule, Take 40 mg by mouth 2 (two) times daily before a meal. , Disp: , Rfl:  .  ferrous gluconate (IRON 27) 240 (27 FE) MG tablet, Take 240 mg by mouth 3 (three) times a week., Disp: , Rfl:  .  gabapentin (NEURONTIN) 400 MG capsule, Take 1 capsule (400 mg total) by mouth 4 (four) times daily., Disp: 120 capsule, Rfl: 2 .  GINKGO BILOBA PO, Take 1 capsule by mouth 3 (three) times a week., Disp: , Rfl:  .  HYDROcodone-acetaminophen (NORCO/VICODIN) 5-325 MG tablet, Take 1 tablet by mouth every 8 (eight) hours. (Patient taking differently: Take 1 tablet by mouth every 8 (eight) hours as needed for moderate pain. ), Disp: 90 tablet, Rfl: 0 .  Krill Oil 350 MG CAPS, Take 350 mg by mouth daily., Disp: , Rfl:  .  lactose free nutrition (BOOST) LIQD, Take 237 mLs by mouth daily., Disp: , Rfl:  .  linaclotide (LINZESS) 290 MCG CAPS capsule, Take 290 mcg by mouth daily., Disp: , Rfl:  .  lovastatin (MEVACOR) 10 MG tablet, Take 10 mg by mouth daily., Disp: , Rfl:  .  meloxicam (MOBIC) 15 MG tablet, Take 15 mg by mouth daily. , Disp: , Rfl:   .  Menthol, Topical Analgesic, (BIOFREEZE EX), Apply 1 application topically daily as needed (muscle pain)., Disp: , Rfl:  .  methylPREDNISolone (MEDROL DOSEPAK) 4 MG TBPK tablet, Take 1 tablet by mouth taper from 4 doses each day to 1 dose and stop., Disp: , Rfl:  .  Multiple Vitamins-Minerals (MULTIVITAMIN GUMMIES ADULT PO), Take 1 tablet by mouth daily., Disp: , Rfl:  .  ondansetron (ZOFRAN) 8 MG tablet, Take 8 mg by mouth every 8 (eight) hours as needed for nausea or vomiting., Disp: , Rfl:  .  orphenadrine (NORFLEX) 100 MG tablet, Take 1 tablet (100 mg total) by mouth 2 (two) times daily., Disp: 60 tablet, Rfl: 1 .  polyethylene glycol (MIRALAX / GLYCOLAX) packet, Take 17 g by mouth daily., Disp: , Rfl:  .  promethazine (PHENERGAN) 12.5 MG suppository, Place 12.5 mg rectally as needed for nausea or vomiting., Disp: , Rfl:  .  senna (SENOKOT) 8.6 MG TABS tablet, Take 3 tablets by mouth daily as needed for mild constipation. constipation , Disp: , Rfl:  .  traZODone (DESYREL) 100 MG tablet, Take 100 mg by mouth at bedtime. , Disp: , Rfl:  .  vitamin B-12 (CYANOCOBALAMIN) 500 MCG tablet, Take 500 mcg by mouth daily., Disp: , Rfl:   ROS  Constitutional: Denies any fever or chills Gastrointestinal: No reported hemesis, hematochezia, vomiting, or acute GI distress Musculoskeletal: Denies any acute onset joint swelling, redness, loss of ROM, or weakness Neurological: No reported episodes of acute onset apraxia, aphasia, dysarthria, agnosia, amnesia, paralysis, loss of coordination,  or loss of consciousness  Allergies  Ms. Cajas is allergic to ampicillin; ampicillin-sulbactam sodium; toradol [ketorolac tromethamine]; tramadol; flexeril [cyclobenzaprine]; ambien [zolpidem tartrate]; cephalexin; percocet [oxycodone-acetaminophen]; sulfamethoxazole-trimethoprim; and tape.  [place]  Drug: Ms. Mckamey  reports that she does not use drugs. Alcohol:  reports that she does not drink alcohol. Tobacco:   reports that she has quit smoking. She has a 45.00 pack-year smoking history. She uses smokeless tobacco. Medical:  has a past medical history of Anemia, B12 deficiency, Bacteremia (10/07/2014), Bladder incontinence, Blind left eye, Cataract, Cervical spine fracture ([place]), Chest pain (07/29/2013), Compression fracture, COPD (chronic obstructive pulmonary disease) ([place]), DDD (degenerative disc disease), lumbar, Depression, Diffuse myofascial pain syndrome (03/24/2015), Fever (10/05/2014), GERD (gastroesophageal reflux disease), Hiatal hernia, Hyperlipidemia, Hypertension, Hypertensive kidney disease, malignant (11/07/2016), Macular degeneration, Osteoarthritis, Osteoporosis, Reactive airway disease, Rupture of bowel ([place]), Sepsis ([place]) (10/08/2014), Stroke [facility]), Stroke ([place]), and Wrist pain, acute (09/10/2012). Surgical: Ms. Adamek  has a past surgical history that includes Abdominal surgery; Colon surgery; Joint replacement; Appendectomy; Bladder surgery; Rectocele repair; Knee surgery; Cholecystectomy; Abdominal hysterectomy; Ostomy; Eye surgery; Intramedullary (im) nail intertrochanteric (Right, 09/13/2016); and Hip surgery (Right, 08/2016). Family: family history includes Heart attack in her father; Heart failure in her mother.  Constitutional Exam  General appearance: Well nourished, well developed, and well hydrated. In no apparent acute distress There were no vitals filed for this visit. BMI Assessment: Estimated body mass index is 31.45 kg/m as calculated from the following:   Height as of 07/17/17: '5\' 5"'  (1.651 m).   Weight as of 07/17/17: 189 lb (85.7 kg).  BMI interpretation table: BMI level Category Range association with higher incidence of chronic pain  <18 kg/m2 Underweight   18.5-24.9 kg/m2 Ideal body weight   25-29.9 kg/m2 Overweight Increased incidence by 20%  30-34.9 kg/m2 Obese (Class I) Increased incidence by 68%  35-39.9 kg/m2 Severe obesity (Class II) Increased incidence by 136%  >40  kg/m2 Extreme obesity (Class III) Increased incidence by 254%   BMI Readings from Last 4 Encounters:  07/17/17 31.45 kg/m  07/10/17 30.79 kg/m  07/03/17 30.95 kg/m  07/02/17 30.95 kg/m   Wt Readings from Last 4 Encounters:  07/17/17 189 lb (85.7 kg)  07/10/17 185 lb (83.9 kg)  07/03/17 186 lb (84.4 kg)  07/02/17 186 lb (84.4 kg)  Psych/Mental status: Alert, oriented x 3 (person, place, & time)       Eyes: PERLA Respiratory: No evidence of acute respiratory distress  Cervical Spine Area Exam  Skin & Axial Inspection: No masses, redness, edema, swelling, or associated skin lesions Alignment: Symmetrical Functional ROM: Unrestricted ROM      Stability: No instability detected Muscle Tone/Strength: Functionally intact. No obvious neuro-muscular anomalies detected. Sensory (Neurological): Unimpaired Palpation: No palpable anomalies              Upper Extremity (UE) Exam    Side: Right upper extremity  Side: Left upper extremity  Skin & Extremity Inspection: Skin color, temperature, and hair growth are WNL. No peripheral edema or cyanosis. No masses, redness, swelling, asymmetry, or associated skin lesions. No contractures.  Skin & Extremity Inspection: Skin color, temperature, and hair growth are WNL. No peripheral edema or cyanosis. No masses, redness, swelling, asymmetry, or associated skin lesions. No contractures.  Functional ROM: Unrestricted ROM          Functional ROM: Unrestricted ROM          Muscle Tone/Strength: Functionally intact. No obvious neuro-muscular anomalies detected.  Muscle Tone/Strength: Functionally intact. No  obvious neuro-muscular anomalies detected.  Sensory (Neurological): Unimpaired          Sensory (Neurological): Unimpaired          Palpation: No palpable anomalies              Palpation: No palpable anomalies              Specialized Test(s): Deferred         Specialized Test(s): Deferred          Thoracic Spine Area Exam  Skin & Axial Inspection:  No masses, redness, or swelling Alignment: Symmetrical Functional ROM: Unrestricted ROM Stability: No instability detected Muscle Tone/Strength: Functionally intact. No obvious neuro-muscular anomalies detected. Sensory (Neurological): Unimpaired Muscle strength & Tone: No palpable anomalies  Lumbar Spine Area Exam  Skin & Axial Inspection: No masses, redness, or swelling Alignment: Symmetrical Functional ROM: Unrestricted ROM      Stability: No instability detected Muscle Tone/Strength: Functionally intact. No obvious neuro-muscular anomalies detected. Sensory (Neurological): Unimpaired Palpation: No palpable anomalies       Provocative Tests: Lumbar Hyperextension and rotation test: evaluation deferred today       Lumbar Lateral bending test: evaluation deferred today       Patrick's Maneuver: evaluation deferred today                    Gait & Posture Assessment  Ambulation: Unassisted Gait: Relatively normal for age and body habitus Posture: WNL   Lower Extremity Exam    Side: Right lower extremity  Side: Left lower extremity  Skin & Extremity Inspection: Skin color, temperature, and hair growth are WNL. No peripheral edema or cyanosis. No masses, redness, swelling, asymmetry, or associated skin lesions. No contractures.  Skin & Extremity Inspection: Skin color, temperature, and hair growth are WNL. No peripheral edema or cyanosis. No masses, redness, swelling, asymmetry, or associated skin lesions. No contractures.  Functional ROM: Unrestricted ROM          Functional ROM: Unrestricted ROM          Muscle Tone/Strength: Functionally intact. No obvious neuro-muscular anomalies detected.  Muscle Tone/Strength: Functionally intact. No obvious neuro-muscular anomalies detected.  Sensory (Neurological): Unimpaired  Sensory (Neurological): Unimpaired  Palpation: No palpable anomalies  Palpation: No palpable anomalies   Assessment  Primary Diagnosis & Pertinent Problem List: The  primary encounter diagnosis was Chronic low back pain [facility] Area of Pain) (Bilateral) (L>R). Diagnoses of Spondylosis without myelopathy or radiculopathy, lumbosacral region, Lumbar facet syndrome (Bilateral) (L>R), Other specified dorsopathies, sacral and sacrococcygeal region, and Chronic sacroiliac joint pain (Right) were also pertinent to this visit.  Status Diagnosis  Controlled Controlled Controlled 1. Chronic low back pain [facility] Area of Pain) (Bilateral) (L>R)   2. Spondylosis without myelopathy or radiculopathy, lumbosacral region   3. Lumbar facet syndrome (Bilateral) (L>R)   4. Other specified dorsopathies, sacral and sacrococcygeal region   5. Chronic sacroiliac joint pain (Right)     Problems updated and reviewed during this visit: Problem  Other Specified Dorsopathies, Sacral and Sacrococcygeal Region   Plan of Care  Pharmacotherapy (Medications Ordered): No orders of the defined types were placed in this encounter.  Medications administered today: Preeya L. Curtiss had no medications administered during this visit.  Procedure Orders    No procedure(s) ordered today   Lab Orders  No laboratory test(s) ordered today   Imaging Orders  No imaging studies ordered today   Referral Orders  No referral(s) requested today  Interventional management options: Planned, scheduled, and/or pending:   Scheduled to undergo a left knee revision by Dr. Lorre Nick on 07/28/2017.   Considering:   Therapeutic right-sided genicular nerve RFA Diagnostic right-sided intra-articular knee joint injection Possible series of 5 right-sided intra-articular Hyalgan knee injections Diagnostic right-sided genicular nerve block Possible right-sided genicular nerve RFA Diagnostic left cervical epidural steroid injection Diagnostic bilateral cervical facet blocks Diagnostic bilateral intra-articular shoulder joint injection Diagnostic bilateral suprascapular nerve block Possible  bilateral suprascapular nerve RFA Diagnostic bilateral lumbar facet block Possible bilateral lumbar facet RFA   Palliative PRN treatment(s):   Palliative right-sided lumbar facet block #3 + right-sided sacroiliac joint block #3   Provider-requested follow-up: No Follow-up on file.  Future Appointments  Date Time Provider [place]  08/04/2017  1:30 PM Milinda Pointer, MD [facility] None  08/08/2017 10:30 AM Janna Arch, PT [facility] None  08/11/2017 11:00 AM Janna Arch, PT [facility] None  08/14/2017  3:15 PM [place], Sherryl Barters, PT [facility] None  08/18/2017 11:00 AM Janna Arch, PT [facility] None  08/20/2017  3:15 PM Janna Arch, PT [facility] None  08/25/2017 11:00 AM Janna Arch, PT [facility] None  08/27/2017 11:00 AM Janna Arch, PT [facility] None  09/01/2017 11:00 AM Janna Arch, PT [facility] None  09/03/2017 11:15 AM Janna Arch, PT [facility] None  09/05/2017 11:00 AM [facility] None  09/08/2017 11:15 AM Janna Arch, PT [facility] None  09/10/2017 11:15 AM Janna Arch, PT [facility] None  09/15/2017 11:15 AM Janna Arch, PT [facility] None  09/17/2017 11:15 AM Janna Arch, PT [facility] None  09/22/2017 11:15 AM Janna Arch, PT [facility] None  09/24/2017 11:15 AM Janna Arch, PT [facility] None  09/29/2017 11:15 AM Janna Arch, PT [facility] None  10/01/2017 11:15 AM Janna Arch, PT [facility] None  10/06/2017 11:15 AM Janna Arch, PT [facility] None  10/08/2017 11:15 AM Janna Arch, PT [facility] None   Primary Care Physician: Wenda Low, MD Location: [facility] Facility Note by: Gaspar Cola, MD Date: 08/04/2017; Time: 9:15 AM

## 2017-08-08 ENCOUNTER — Ambulatory Visit: Payer: Medicare Other

## 2017-08-12 ENCOUNTER — Other Ambulatory Visit: Payer: Self-pay

## 2017-08-12 ENCOUNTER — Encounter: Payer: Self-pay | Admitting: Nurse Practitioner

## 2017-08-12 ENCOUNTER — Ambulatory Visit
Admission: RE | Admit: 2017-08-12 | Discharge: 2017-08-12 | Disposition: A | Discharge status: Home / Self Care | Payer: Medicare Other | Source: Ambulatory Visit | Attending: Nurse Practitioner | Admitting: Nurse Practitioner

## 2017-08-12 ENCOUNTER — Ambulatory Visit: Payer: Medicare Other | Attending: Nurse Practitioner | Admitting: Nurse Practitioner

## 2017-08-12 VITALS — BP 132/94 | HR 87 | Temp 98.2°F | Resp 16 | Ht 65.0 in | Wt 184.0 lb

## 2017-08-12 DIAGNOSIS — M7918 Myalgia, other site: Secondary | ICD-10-CM | POA: Diagnosis not present

## 2017-08-12 DIAGNOSIS — Z79899 Other long term (current) drug therapy: Secondary | ICD-10-CM | POA: Insufficient documentation

## 2017-08-12 DIAGNOSIS — M25561 Pain in right knee: Secondary | ICD-10-CM | POA: Diagnosis not present

## 2017-08-12 DIAGNOSIS — M545 Low back pain: Secondary | ICD-10-CM | POA: Diagnosis not present

## 2017-08-12 DIAGNOSIS — G8929 Other chronic pain: Secondary | ICD-10-CM | POA: Insufficient documentation

## 2017-08-12 DIAGNOSIS — Z79891 Long term (current) use of opiate analgesic: Secondary | ICD-10-CM | POA: Insufficient documentation

## 2017-08-12 DIAGNOSIS — F419 Anxiety disorder, unspecified: Secondary | ICD-10-CM | POA: Insufficient documentation

## 2017-08-12 DIAGNOSIS — M150 Primary generalized (osteo)arthritis: Secondary | ICD-10-CM | POA: Diagnosis not present

## 2017-08-12 DIAGNOSIS — K219 Gastro-esophageal reflux disease without esophagitis: Secondary | ICD-10-CM | POA: Insufficient documentation

## 2017-08-12 DIAGNOSIS — M25551 Pain in right hip: Secondary | ICD-10-CM | POA: Diagnosis present

## 2017-08-12 DIAGNOSIS — Z882 Allergy status to sulfonamides status: Secondary | ICD-10-CM | POA: Insufficient documentation

## 2017-08-12 DIAGNOSIS — M810 Age-related osteoporosis without current pathological fracture: Secondary | ICD-10-CM | POA: Insufficient documentation

## 2017-08-12 DIAGNOSIS — Z8673 Personal history of transient ischemic attack (TIA), and cerebral infarction without residual deficits: Secondary | ICD-10-CM | POA: Insufficient documentation

## 2017-08-12 DIAGNOSIS — K449 Diaphragmatic hernia without obstruction or gangrene: Secondary | ICD-10-CM | POA: Diagnosis not present

## 2017-08-12 DIAGNOSIS — M159 Polyosteoarthritis, unspecified: Secondary | ICD-10-CM

## 2017-08-12 DIAGNOSIS — E538 Deficiency of other specified B group vitamins: Secondary | ICD-10-CM | POA: Insufficient documentation

## 2017-08-12 DIAGNOSIS — M1288 Other specific arthropathies, not elsewhere classified, other specified site: Secondary | ICD-10-CM | POA: Insufficient documentation

## 2017-08-12 DIAGNOSIS — F329 Major depressive disorder, single episode, unspecified: Secondary | ICD-10-CM | POA: Insufficient documentation

## 2017-08-12 DIAGNOSIS — Z880 Allergy status to penicillin: Secondary | ICD-10-CM | POA: Insufficient documentation

## 2017-08-12 DIAGNOSIS — D649 Anemia, unspecified: Secondary | ICD-10-CM | POA: Diagnosis not present

## 2017-08-12 DIAGNOSIS — R32 Unspecified urinary incontinence: Secondary | ICD-10-CM | POA: Insufficient documentation

## 2017-08-12 DIAGNOSIS — M533 Sacrococcygeal disorders, not elsewhere classified: Secondary | ICD-10-CM | POA: Insufficient documentation

## 2017-08-12 DIAGNOSIS — N183 Chronic kidney disease, stage 3 (moderate): Secondary | ICD-10-CM | POA: Diagnosis not present

## 2017-08-12 DIAGNOSIS — M25512 Pain in left shoulder: Secondary | ICD-10-CM | POA: Diagnosis not present

## 2017-08-12 DIAGNOSIS — Z888 Allergy status to other drugs, medicaments and biological substances status: Secondary | ICD-10-CM | POA: Insufficient documentation

## 2017-08-12 DIAGNOSIS — Z5181 Encounter for therapeutic drug level monitoring: Secondary | ICD-10-CM | POA: Diagnosis not present

## 2017-08-12 DIAGNOSIS — H3530 Unspecified macular degeneration: Secondary | ICD-10-CM | POA: Diagnosis not present

## 2017-08-12 DIAGNOSIS — M8949 Other hypertrophic osteoarthropathy, multiple sites: Secondary | ICD-10-CM

## 2017-08-12 DIAGNOSIS — M5030 Other cervical disc degeneration, unspecified cervical region: Secondary | ICD-10-CM | POA: Insufficient documentation

## 2017-08-12 DIAGNOSIS — H5462 Unqualified visual loss, left eye, normal vision right eye: Secondary | ICD-10-CM | POA: Diagnosis not present

## 2017-08-12 DIAGNOSIS — M47897 Other spondylosis, lumbosacral region: Secondary | ICD-10-CM | POA: Diagnosis not present

## 2017-08-12 DIAGNOSIS — E7800 Pure hypercholesterolemia, unspecified: Secondary | ICD-10-CM | POA: Insufficient documentation

## 2017-08-12 DIAGNOSIS — M792 Neuralgia and neuritis, unspecified: Secondary | ICD-10-CM | POA: Diagnosis not present

## 2017-08-12 DIAGNOSIS — G894 Chronic pain syndrome: Secondary | ICD-10-CM

## 2017-08-12 DIAGNOSIS — M47816 Spondylosis without myelopathy or radiculopathy, lumbar region: Secondary | ICD-10-CM

## 2017-08-12 DIAGNOSIS — Z885 Allergy status to narcotic agent status: Secondary | ICD-10-CM | POA: Insufficient documentation

## 2017-08-12 DIAGNOSIS — J449 Chronic obstructive pulmonary disease, unspecified: Secondary | ICD-10-CM | POA: Insufficient documentation

## 2017-08-12 DIAGNOSIS — Z96653 Presence of artificial knee joint, bilateral: Secondary | ICD-10-CM | POA: Insufficient documentation

## 2017-08-12 DIAGNOSIS — I129 Hypertensive chronic kidney disease with stage 1 through stage 4 chronic kidney disease, or unspecified chronic kidney disease: Secondary | ICD-10-CM | POA: Diagnosis not present

## 2017-08-12 DIAGNOSIS — Z881 Allergy status to other antibiotic agents status: Secondary | ICD-10-CM | POA: Insufficient documentation

## 2017-08-12 DIAGNOSIS — M8580 Other specified disorders of bone density and structure, unspecified site: Secondary | ICD-10-CM | POA: Insufficient documentation

## 2017-08-12 DIAGNOSIS — M5136 Other intervertebral disc degeneration, lumbar region: Secondary | ICD-10-CM | POA: Insufficient documentation

## 2017-08-12 DIAGNOSIS — Z791 Long term (current) use of non-steroidal anti-inflammatories (NSAID): Secondary | ICD-10-CM | POA: Insufficient documentation

## 2017-08-12 DIAGNOSIS — M25562 Pain in left knee: Secondary | ICD-10-CM | POA: Diagnosis not present

## 2017-08-12 DIAGNOSIS — Z87891 Personal history of nicotine dependence: Secondary | ICD-10-CM | POA: Insufficient documentation

## 2017-08-12 MED ORDER — DICLOFENAC SODIUM 1 % TD GEL: 2.0000 g | Freq: Four times a day (QID) | TRANSDERMAL | 2 refills | Status: DC

## 2017-08-12 MED ORDER — GABAPENTIN 400 MG PO CAPS: 400.0000 mg | ORAL_CAPSULE | Freq: Every day | ORAL | 0 refills | Status: DC

## 2017-08-12 MED ORDER — HYDROCODONE-ACETAMINOPHEN 5-325 MG PO TABS: 1.0000 | ORAL_TABLET | Freq: Three times a day (TID) | ORAL | 0 refills | Status: DC

## 2017-08-12 NOTE — Progress Notes (Signed)
Patient's Name: [name]  MRN: [id number]  Referring Provider: Wenda Low, MD  DOB: 1940/11/02  PCP: Wenda Low, MD  DOS: 08/12/2017  Note by: Vevelyn Francois NP  Service setting: Ambulatory outpatient  Specialty: Interventional Pain Management  Location: [facility] (AMB) Pain Management Facility    Patient type: Established    Primary Reason(s) for Visit: Encounter for prescription drug management & post-procedure evaluation of chronic illness with mild to moderate exacerbation(Level of risk: moderate) CC: Back Pain (lower) and Knee Pain (right)  HPI  [name] is a 77 y.o. year old, female patient, who comes today for a post-procedure evaluation and medication management. 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[place]); Hypertensive kidney disease, malignant; Chronic low back pain (Tertiary Area of Pain) (Bilateral) (L>R); CAFL (chronic airflow limitation) ([place]); Apoplectic vertigo; Duodenogastric reflux; History of stroke; Hoarseness; Benign essential HTN; Chronic knee pain (Primary Area of Pain) (Right); Depression, major, in partial remission ([place]); Major depression in remission ([place]); Involutional osteoporosis; Pharyngoesophageal dysphagia; 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); Hx of total knee replacement (Bilateral); Infection of the upper respiratory tract; Bed wetting; UTI (urinary tract infection); Long term current use of opiate analgesic; Long term prescription opiate use; Chronic pain syndrome; Chronic neck pain (Secondary Area of Pain) (Bilateral) (L>R); Chronic hip pain  (Right); Rib pain; DDD (degenerative disc disease), cervical; Lumbar facet arthropathy (Bilateral); Chronic myofascial pain; Lumbar facet syndrome (Bilateral) (L>R); Long term prescription benzodiazepine use; Opiate use; Disorder of skeletal system; Pharmacologic therapy; Problems influencing health status; Opioid-induced constipation (OIC); NSAID long-term use; DDD (degenerative disc disease), lumbar; Chronic knee pain (Bilateral) (R>L); Osteoarthritis; Chronic musculoskeletal pain; Neurogenic pain; Chronic knee pain s/p total knee replacement (TKR) (Right); CKD (chronic kidney disease) stage 3, GFR 30-59 ml/min ([place]); Chronic shoulder pain (Left); Non-traumatic compression fracture of T3 thoracic vertebra, sequela; High risk medication use; At high risk for falls; Chronic sacroiliac joint pain (Right); Spondylosis without myelopathy or radiculopathy, lumbosacral region; and Other specified dorsopathies, sacral and sacrococcygeal region on their problem list. Her primarily concern today is the Back Pain (lower) and Knee Pain (right)  Pain Assessment: Location: Lower Back Radiating: moves through right hip down front of right leg to right knee Onset: More than a month ago Duration: Chronic pain Quality: Constant, Throbbing Severity: 9 /10 (self-reported pain score)  Note: Reported level is compatible with observation. Clinically the patient looks like a 3/10 A 3/10 is viewed as "Moderate" and described as significantly interfering with activities of daily living (ADL). It becomes difficult to feed, bathe, get dressed, get on and off the toilet or to perform personal hygiene functions. Difficult to get in and out of bed or a chair without assistance. Very distracting. With effort, it can be ignored when deeply involved in activities. Information on the proper use of the pain scale provided to the patient today. When using our objective Pain Scale, levels between 6 and 10/10 are said to belong in an emergency  room, as it progressively worsens from a 6/10, described as severely limiting, requiring emergency care not usually available at an outpatient pain management facility. At a 6/10 level, communication becomes difficult and requires great effort. Assistance to reach the emergency department may be required. Facial  flushing and profuse sweating along with potentially dangerous increases in heart rate and blood pressure will be evident. Effect on ADL: pt states she needs to sit all the time Timing: Constant Modifying factors: sitting, reclining, procedure  [name] was last seen on 07/10/2017 for a procedure. During today's appointment we reviewed Ms. Deloney's post-procedure results, as well as her outpatient medication regimen. She suffered a fall on 08/09/17 while trying to separate some dogs from fighting. She was not seen for this. She admits that she continues to have right hip pain. She is concern and would like an xray to have this evaluated. She just had an xray in Jan 2019. She admits that she is going to have left knee surgery soon. She admits that she is also having pain is the right knee.   Further details on both, my assessment(s), as well as the proposed treatment plan, please see below.  Controlled Substance Pharmacotherapy Assessment REMS (Risk Evaluation and Mitigation Strategy)  Analgesic: hydrocodone/acetaminophen 5/325 3 times a day (hydrocodone 15 mg per day) MME/day: 15 mg/day.  Rise Patience, RN  08/12/2017  2:50 PM  Sign at close encounter Nursing Pain Medication Assessment:  Safety precautions to be maintained throughout the outpatient stay will include: orient to surroundings, keep bed in low position, maintain call bell within reach at all times, provide assistance with transfer out of bed and ambulation.  Medication Inspection Compliance: Pill count conducted under aseptic conditions, in front of the patient. Neither the pills nor the bottle was removed from the patient's  sight at any time. Once count was completed pills were immediately returned to the patient in their original bottle.  Medication: Hydrocodone/APAP Pill/Patch Count: 0 of 120 pills remain Pill/Patch Appearance: Markings consistent with prescribed medication Bottle Appearance: Standard pharmacy container. Clearly labeled. Filled Date: 82 / 62 / 2018 Last Medication intake:  Ran out of medicine more than 48 hours ago   Pharmacokinetics: Liberation and absorption (onset of action): WNL Distribution (time to peak effect): WNL Metabolism and excretion (duration of action): WNL         Pharmacodynamics: Desired effects: Analgesia: Ms. Bacigalupi reports >50% benefit. Functional ability: Patient reports that medication allows her to accomplish basic ADLs Clinically meaningful improvement in function (CMIF): Sustained CMIF goals met Perceived effectiveness: Described as relatively effective, allowing for increase in activities of daily living (ADL) Undesirable effects: Side-effects or Adverse reactions: None reported Monitoring: [place] PMP: Online review of the past 86-monthperiod conducted. Compliant with practice rules and regulations Last UDS on record: Summary  Date Value Ref Range Status  11/07/2016 FINAL  Final    Comment:    ==================================================================== TOXASSURE COMP DRUG ANALYSIS,UR ==================================================================== Test                             Result       Flag       Units Drug Present and Declared for Prescription Verification   Alprazolam                     95           EXPECTED   ng/mg creat   Alpha-hydroxyalprazolam        71           EXPECTED   ng/mg creat    Source of alprazolam is a scheduled prescription medication.    Alpha-hydroxyalprazolam is an expected metabolite of alprazolam.   Hydrocodone  946          EXPECTED   ng/mg creat   Hydromorphone                  53            EXPECTED   ng/mg creat   Dihydrocodeine                 75           EXPECTED   ng/mg creat   Norhydrocodone                 1955         EXPECTED   ng/mg creat    Sources of hydrocodone include scheduled prescription    medications. Hydromorphone, dihydrocodeine and norhydrocodone are    expected metabolites of hydrocodone. Hydromorphone and    dihydrocodeine are also available as scheduled prescription    medications.   Oxycodone                      182          EXPECTED   ng/mg creat   Noroxycodone                   1913         EXPECTED   ng/mg creat    Sources of oxycodone include scheduled prescription medications.    Noroxycodone is an expected metabolite of oxycodone.   Gabapentin                     PRESENT      EXPECTED   Methocarbamol                  PRESENT      EXPECTED   Citalopram                     PRESENT      EXPECTED   Desmethylcitalopram            PRESENT      EXPECTED    Desmethylcitalopram is an expected metabolite of citalopram or    the enantiomeric form, escitalopram.   Trazodone                      PRESENT      EXPECTED   1,3 chlorophenyl piperazine    PRESENT      EXPECTED    1,3-chlorophenyl piperazine is an expected metabolite of    trazodone.   Acetaminophen                  PRESENT      EXPECTED Drug Present not Declared for Prescription Verification   Cyclobenzaprine                PRESENT      UNEXPECTED Drug Absent but Declared for Prescription Verification   Baclofen                       Not Detected UNEXPECTED   Lidocaine                      Not Detected UNEXPECTED    Lidocaine, as indicated in the declared medication list, is not    always detected even when used as directed. ==================================================================== Test  Result    Flag   Units      Ref Range   Creatinine              112              mg/dL       >=20 ==================================================================== Declared Medications:  The flagging and interpretation on this report are based on the  following declared medications.  Unexpected results may arise from  inaccuracies in the declared medications.  **Note: The testing scope of this panel includes these medications:  Alprazolam  Baclofen  Citalopram (Celexa)  Gabapentin  Hydrocodone (Hydrocodone-Acetaminophen)  Methocarbamol  Oxycodone (Oxycodone Acetaminophen)  Trazodone  **Note: The testing scope of this panel does not include small to  moderate amounts of these reported medications:  Acetaminophen  Acetaminophen (Hydrocodone-Acetaminophen)  Acetaminophen (Oxycodone Acetaminophen)  Lidocaine  **Note: The testing scope of this panel does not include following  reported medications:  Albuterol  Amlodipine  Buspirone  Ciprofloxacin  Cyanocobalamin  Docusate  Enoxaparin  Iron  Lovastatin  Meloxicam  Methylprednisolone  Naloxegol (Movantik)  Omeprazole (Nexium)  Ondansetron  Polyethylene Glycol  Sennosides  Tamsulosin ==================================================================== For clinical consultation, please call [phone]. ====================================================================    UDS interpretation: Compliant          Medication Assessment Form: Reviewed. Patient indicates being compliant with therapy Treatment compliance: Compliant Risk Assessment Profile: Aberrant behavior: See prior evaluations. None observed or detected today Comorbid factors increasing risk of overdose: See prior notes. No additional risks detected today Risk of substance use disorder (SUD): Low Opioid Risk Tool - 08/12/17 0923      Family History of Substance Abuse   Alcohol  Positive Female    Illegal Drugs  Negative    Rx Drugs  Negative      Personal History of Substance Abuse   Alcohol  Negative    Illegal Drugs  Negative    Rx  Drugs  Negative      Age   Age between 73-45 years   No      Psychological Disease   Psychological Disease  Negative    ADD  Negative    OCD  Negative    Bipolar  Negative    Schizophrenia  Negative    Depression  Negative      Total Score   Opioid Risk Tool Scoring  1    Opioid Risk Interpretation  Low Risk      ORT Scoring interpretation table:  Score <3 = Low Risk for SUD  Score between 4-7 = Moderate Risk for SUD  Score >8 = High Risk for Opioid Abuse   Risk Mitigation Strategies:  Patient Counseling: Covered Patient-Prescriber Agreement (PPA): Present and active  Notification to other healthcare providers: Done  Pharmacologic Plan: No change in therapy, at this time.             Post-Procedure Assessment  07/17/2017 Procedure: diagnostic right lumbar facet and SI joint nerve block Pre-procedure pain score:  5/10 Post-procedure pain score: 2/10         Influential Factors: BMI: 30.62 kg/m Intra-procedural challenges: None observed.         Assessment challenges: None detected.              Reported side-effects: None.        Post-procedural adverse reactions or complications: None reported         Sedation: Please see nurses note. When no sedatives are used, the analgesic levels obtained  are directly associated to the effectiveness of the local anesthetics. However, when sedation is provided, the level of analgesia obtained during the initial 1 hour following the intervention, is believed to be the result of a combination of factors. These factors may include, but are not limited to: 1. The effectiveness of the local anesthetics used. 2. The effects of the analgesic(s) and/or anxiolytic(s) used. 3. The degree of discomfort experienced by the patient at the time of the procedure. 4. The patients ability and reliability in recalling and recording the events. 5. The presence and influence of possible secondary gains and/or psychosocial factors. Reported result: Relief  experienced during the 1st hour after the procedure: 100 % (Ultra-Short Term Relief)            Interpretative annotation: Clinically appropriate result. Analgesia during this period is likely to be Local Anesthetic and/or IV Sedative (Analgesic/Anxiolytic) related.          Effects of local anesthetic: The analgesic effects attained during this period are directly associated to the localized infiltration of local anesthetics and therefore cary significant diagnostic value as to the etiological location, or anatomical origin, of the pain. Expected duration of relief is directly dependent on the pharmacodynamics of the local anesthetic used. Long-acting (4-6 hours) anesthetics used.  Reported result: Relief during the next 4 to 6 hour after the procedure: 100 % (Short-Term Relief)            Interpretative annotation: Clinically appropriate result. Analgesia during this period is likely to be Local Anesthetic-related.          Long-term benefit: Defined as the period of time past the expected duration of local anesthetics (1 hour for short-acting and 4-6 hours for long-acting). With the possible exception of prolonged sympathetic blockade from the local anesthetics, benefits during this period are typically attributed to, or associated with, other factors such as analgesic sensory neuropraxia, antiinflammatory effects, or beneficial biochemical changes provided by agents other than the local anesthetics.  Reported result: Extended relief following procedure: 50 % (Long-Term Relief)            Interpretative annotation: Clinically appropriate result. Good relief. No permanent benefit expected. Inflammation plays a part in the etiology to the pain.          Current benefits: Defined as reported results that persistent at this point in time.   Analgesia: >50 %            Function: Ms. Hersman reports improvement in function ROM: Ms. Griffiths reports improvement in ROM Interpretative annotation: Ongoing  benefit.    Effective diagnostic intervention.          Interpretation: Results would suggest a successful diagnostic intervention.                  Plan:  Please see "Plan of Care" for details.                Laboratory Chemistry  Inflammation Markers (CRP: Acute Phase) (ESR: Chronic Phase) Lab Results  Component Value Date   CRP <0.8 11/19/2016   ESRSEDRATE 20 11/19/2016   LATICACIDVEN 0.98 10/07/2014                         Rheumatology Markers No results found for: RF, ANA, LABURIC, URICUR, LYMEIGGIGMAB, LYMEABIGMQN              Renal Function Markers Lab Results  Component Value Date   BUN 7 (L) 12/04/2016  CREATININE 1.09 (H) 12/04/2016   GFRAA 57 (L) 12/04/2016   GFRNONAA 50 (L) 12/04/2016                 Hepatic Function Markers Lab Results  Component Value Date   AST 19 12/04/2016   ALT 15 12/04/2016   ALBUMIN 4.0 12/04/2016   ALKPHOS 104 12/04/2016   AMYLASE 68 01/25/2007   LIPASE 19 04/15/2016   AMMONIA 66 12/04/2016                 Electrolytes Lab Results  Component Value Date   NA 145 (H) 12/04/2016   K 4.0 12/04/2016   CL 107 (H) 12/04/2016   CALCIUM 9.4 12/04/2016   MG 2.0 11/19/2016                        Neuropathy Markers Lab Results  Component Value Date   VITAMINB12 1,888 (H) 11/19/2016   HGBA1C 5.6 05/22/2015                 Bone Pathology Markers Lab Results  Component Value Date   25OHVITD1 37 11/19/2016   25OHVITD2 <1.0 11/19/2016   25OHVITD3 37 11/19/2016                         Coagulation Parameters Lab Results  Component Value Date   INR 1.00 09/13/2016   LABPROT 13.2 09/13/2016   APTT 34 01/04/2011   PLT 263 12/04/2016   DDIMER 1.71 (H) 10/23/2016                 Cardiovascular Markers Lab Results  Component Value Date   BNP 60.2 01/23/2016   TROPONINI <0.03 05/23/2015   HGB 14.7 12/04/2016   HCT 44.8 12/04/2016                 CA Markers No results found for: CEA, CA125, LABCA2               Note:  Lab results reviewed.  Recent Diagnostic Imaging Results  DG HIP UNILAT W OR W/O PELVIS 2-3 VIEWS RIGHT CLINICAL DATA:  Chronic right hip pain, surgery on the right hip 1 year ago, fell 1 week ago  EXAM: DG HIP (WITH OR WITHOUT PELVIS) 2-3V RIGHT  COMPARISON:  Pelvis and right hip films of 06/25/2017  FINDINGS: Intramedullary rod is present for fixation of prior intertrochanteric femoral fracture. The fracture line through the intertrochanteric femur is less evident, consistent with some interval healing. No acute fracture is seen. The bones are diffusely osteopenic. The pelvic rami are intact. There is some sclerosis however noted through the right sacrum and healing fracture would be difficult to exclude. CT of the pelvis may be helpful if further assessment is warranted clinically.  IMPRESSION: 1. Cannot exclude subtle fracture through the right sacrum. Consider CT of the pelvis to assess further. 2. No acute fracture of the right hip after pinning. Healing fracture of the right intertrochanteric femur. 3. Osteopenia.  Electronically Signed   By: Ivar Drape M.D.   On: 08/12/2017 13:35  Complexity Note: Imaging results reviewed. Results shared with Ms. Borgwardt, using Layman's terms.                         Meds   Current Outpatient Medications:  .  albuterol (PROVENTIL HFA;VENTOLIN HFA) 108 (90 BASE) MCG/ACT inhaler, Inhale 2 puffs into the lungs every 6 (  six) hours as needed for wheezing or shortness of breath. , Disp: , Rfl:  .  albuterol (PROVENTIL) (2.5 MG/3ML) 0.083% nebulizer solution, Take 2.5 mg by nebulization every 6 (six) hours as needed for wheezing or shortness of breath., Disp: , Rfl:  .  alendronate (FOSAMAX) 70 MG tablet, Take 70 mg by mouth every Monday. Take with a full glass of water on an empty stomach., Disp: , Rfl:  .  ALPRAZolam (XANAX) 1 MG tablet, Take 1 mg by mouth at bedtime. , Disp: , Rfl:  .  amLODipine (NORVASC) 5 MG tablet, Take 5 mg by  mouth daily. , Disp: , Rfl:  .  CALCIUM PO, Take 1 tablet by mouth 2 (two) times daily., Disp: , Rfl:  .  citalopram (CELEXA) 20 MG tablet, Take 30 mg by mouth daily. , Disp: , Rfl:  .  diclofenac sodium (VOLTAREN) 1 % GEL, Apply 2 g topically 4 (four) times daily., Disp: 100 g, Rfl: 2 .  docusate sodium (COLACE) 100 MG capsule, Take 300 mg by mouth daily as needed for mild constipation. , Disp: , Rfl:  .  esomeprazole (NEXIUM) 40 MG capsule, Take 40 mg by mouth 2 (two) times daily before a meal. , Disp: , Rfl:  .  ferrous gluconate (IRON 27) 240 (27 FE) MG tablet, Take 240 mg by mouth 3 (three) times a week., Disp: , Rfl:  .  gabapentin (NEURONTIN) 400 MG capsule, Take 1 capsule (400 mg total) by mouth at bedtime., Disp: 30 capsule, Rfl: 0 .  GINKGO BILOBA PO, Take 1 capsule by mouth 3 (three) times a week., Disp: , Rfl:  .  HYDROcodone-acetaminophen (NORCO/VICODIN) 5-325 MG tablet, Take 1 tablet by mouth every 8 (eight) hours., Disp: 90 tablet, Rfl: 0 .  Krill Oil 350 MG CAPS, Take 350 mg by mouth daily., Disp: , Rfl:  .  lactose free nutrition (BOOST) LIQD, Take 237 mLs by mouth daily., Disp: , Rfl:  .  linaclotide (LINZESS) 290 MCG CAPS capsule, Take 290 mcg by mouth daily., Disp: , Rfl:  .  lovastatin (MEVACOR) 10 MG tablet, Take 10 mg by mouth daily., Disp: , Rfl:  .  meloxicam (MOBIC) 15 MG tablet, Take 15 mg by mouth daily. , Disp: , Rfl:  .  Menthol, Topical Analgesic, (BIOFREEZE EX), Apply 1 application topically daily as needed (muscle pain)., Disp: , Rfl:  .  Multiple Vitamins-Minerals (MULTIVITAMIN GUMMIES ADULT PO), Take 1 tablet by mouth daily., Disp: , Rfl:  .  ondansetron (ZOFRAN) 8 MG tablet, Take 8 mg by mouth every 8 (eight) hours as needed for nausea or vomiting., Disp: , Rfl:  .  orphenadrine (NORFLEX) 100 MG tablet, Take 1 tablet (100 mg total) by mouth 2 (two) times daily., Disp: 60 tablet, Rfl: 1 .  polyethylene glycol (MIRALAX / GLYCOLAX) packet, Take 17 g by mouth  daily., Disp: , Rfl:  .  promethazine (PHENERGAN) 12.5 MG suppository, Place 12.5 mg rectally as needed for nausea or vomiting., Disp: , Rfl:  .  senna (SENOKOT) 8.6 MG TABS tablet, Take 3 tablets by mouth daily as needed for mild constipation. constipation , Disp: , Rfl:  .  traZODone (DESYREL) 100 MG tablet, Take 100 mg by mouth at bedtime. , Disp: , Rfl:  .  vitamin B-12 (CYANOCOBALAMIN) 500 MCG tablet, Take 500 mcg by mouth daily., Disp: , Rfl:  .  baclofen (LIORESAL) 10 MG tablet, Take 1 tablet (10 mg total) by mouth daily. (Patient not taking: Reported  on 07/15/2017), Disp: 30 tablet, Rfl: 2  ROS  Constitutional: Denies any fever or chills Gastrointestinal: No reported hemesis, hematochezia, vomiting, or acute GI distress Musculoskeletal: Denies any acute onset joint swelling, redness, loss of ROM, or weakness Neurological: No reported episodes of acute onset apraxia, aphasia, dysarthria, agnosia, amnesia, paralysis, loss of coordination, or loss of consciousness  Allergies  Ms. Babic is allergic to ampicillin; ampicillin-sulbactam sodium; toradol [ketorolac tromethamine]; tramadol; flexeril [cyclobenzaprine]; ambien [zolpidem tartrate]; cephalexin; percocet [oxycodone-acetaminophen]; sulfamethoxazole-trimethoprim; and tape.  [address]  Drug: Ms. Requejo  reports that she does not use drugs. Alcohol:  reports that she does not drink alcohol. Tobacco:  reports that she has quit smoking. She has a 45.00 pack-year smoking history. She uses smokeless tobacco. Medical:  has a past medical history of Anemia, B12 deficiency, Bacteremia (10/07/2014), Bladder incontinence, Blind left eye, Cataract, Cervical spine fracture ([place]), Chest pain (07/29/2013), Compression fracture, COPD (chronic obstructive pulmonary disease) ([place]), DDD (degenerative disc disease), lumbar, Depression, Diffuse myofascial pain syndrome (03/24/2015), Fever (10/05/2014), GERD (gastroesophageal reflux disease), Hiatal hernia,  Hyperlipidemia, Hypertension, Hypertensive kidney disease, malignant (11/07/2016), Macular degeneration, Osteoarthritis, Osteoporosis, Reactive airway disease, Rupture of bowel ([place]), Sepsis ([place]) (10/08/2014), Stroke [facility]), Stroke ([place]), and Wrist pain, acute (09/10/2012). Surgical: Ms. Stlouis  has a past surgical history that includes Abdominal surgery; Colon surgery; Joint replacement; Appendectomy; Bladder surgery; Rectocele repair; Knee surgery; Cholecystectomy; Abdominal hysterectomy; Ostomy; Eye surgery; Intramedullary (im) nail intertrochanteric (Right, 09/13/2016); and Hip surgery (Right, 08/2016). Family: family history includes Heart attack in her father; Heart failure in her mother.  Constitutional Exam  General appearance: Well nourished, well developed, and well hydrated. In no apparent acute distress Vitals:   08/12/17 0909  BP: (!) 132/94  Pulse: 87  Resp: 16  Temp: 98.2 F (36.8 C)  TempSrc: Oral  SpO2: 96%  Weight: 184 lb (83.5 kg)  Height: '5\' 5"'  (1.651 m)  Psych/Mental status: Alert, oriented x 3 (person, place, & time)       Eyes: PERLA Respiratory: No evidence of acute respiratory distress  Cervical Spine Area Exam  Skin & Axial Inspection: No masses, redness, edema, swelling, or associated skin lesions Alignment: Symmetrical Functional ROM: Unrestricted ROM      Stability: No instability detected Muscle Tone/Strength: Functionally intact. No obvious neuro-muscular anomalies detected. Sensory (Neurological): Unimpaired Palpation: No palpable anomalies              Upper Extremity (UE) Exam    Side: Right upper extremity  Side: Left upper extremity  Skin & Extremity Inspection: Skin color, temperature, and hair growth are WNL. No peripheral edema or cyanosis. No masses, redness, swelling, asymmetry, or associated skin lesions. No contractures.  Skin & Extremity Inspection: Skin color, temperature, and hair growth are WNL. No peripheral edema or cyanosis. No masses,  redness, swelling, asymmetry, or associated skin lesions. No contractures.  Functional ROM: Unrestricted ROM          Functional ROM: Unrestricted ROM          Muscle Tone/Strength: Functionally intact. No obvious neuro-muscular anomalies detected.  Muscle Tone/Strength: Functionally intact. No obvious neuro-muscular anomalies detected.  Sensory (Neurological): Unimpaired          Sensory (Neurological): Unimpaired          Palpation: No palpable anomalies              Palpation: No palpable anomalies              Specialized Test(s): Deferred  Specialized Test(s): Deferred          Thoracic Spine Area Exam  Skin & Axial Inspection: No masses, redness, or swelling Alignment: Symmetrical Functional ROM: Unrestricted ROM Stability: No instability detected Muscle Tone/Strength: Functionally intact. No obvious neuro-muscular anomalies detected. Sensory (Neurological): Unimpaired Muscle strength & Tone: No palpable anomalies  Lumbar Spine Area Exam  Skin & Axial Inspection: No masses, redness, or swelling Alignment: Symmetrical Functional ROM: Unrestricted ROM      Stability: No instability detected Muscle Tone/Strength: Functionally intact. No obvious neuro-muscular anomalies detected. Sensory (Neurological): Unimpaired Palpation: No palpable anomalies       Provocative Tests: Lumbar Hyperextension and rotation test: evaluation deferred today       Lumbar Lateral bending test: evaluation deferred today       Patrick's Maneuver: evaluation deferred today                    Gait & Posture Assessment  Ambulation: Patient came in today in a wheel chair Gait: Relatively normal for age and body habitus Posture: WNL   Lower Extremity Exam    Side: Right lower extremity  Side: Left lower extremity  Skin & Extremity Inspection: Skin color, temperature, and hair growth are WNL. No peripheral edema or cyanosis. No masses, redness, swelling, asymmetry, or associated skin lesions. No  contractures.  Skin & Extremity Inspection: Skin color, temperature, and hair growth are WNL. No peripheral edema or cyanosis. No masses, redness, swelling, asymmetry, or associated skin lesions. No contractures.  Functional ROM: Adequate ROM          Functional ROM: Unrestricted ROM          Muscle Tone/Strength: Functionally intact. No obvious neuro-muscular anomalies detected.  Muscle Tone/Strength: Functionally intact. No obvious neuro-muscular anomalies detected.  Sensory (Neurological): Unimpaired  Sensory (Neurological): Unimpaired  Palpation: No palpable anomalies  Palpation: No palpable anomalies   Assessment  Primary Diagnosis & Pertinent Problem List: The primary encounter diagnosis was Chronic hip pain (Right). Diagnoses of Lumbar facet arthropathy (Bilateral), Neurogenic pain, Osteoarthritis, Chronic pain syndrome, Chronic musculoskeletal pain, and Long term current use of opiate analgesic were also pertinent to this visit.  Status Diagnosis  Controlled Controlled Controlled 1. Chronic hip pain (Right)   2. Lumbar facet arthropathy (Bilateral)   3. Neurogenic pain   4. Osteoarthritis   5. Chronic pain syndrome   6. Chronic musculoskeletal pain   7. Long term current use of opiate analgesic     Problems updated and reviewed during this visit: No problems updated. Plan of Care  Pharmacotherapy (Medications Ordered): Meds ordered this encounter  Medications  . gabapentin (NEURONTIN) 400 MG capsule    Sig: Take 1 capsule (400 mg total) by mouth at bedtime.    Dispense:  30 capsule    Refill:  0    Do not place medication on "Automatic Refill". Fill one day early if pharmacy is closed on scheduled refill date.    Order Specific Question:   Supervising Provider    Answer:   Milinda Pointer [phone]  . diclofenac sodium (VOLTAREN) 1 % GEL    Sig: Apply 2 g topically 4 (four) times daily.    Dispense:  100 g    Refill:  2    Do not place medication on "Automatic Refill".  Fill one day early if pharmacy is closed on scheduled refill date.    Order Specific Question:   Supervising Provider    Answer:   Milinda Pointer [phone]  .  HYDROcodone-acetaminophen (NORCO/VICODIN) 5-325 MG tablet    Sig: Take 1 tablet by mouth every 8 (eight) hours.    Dispense:  90 tablet    Refill:  0    Do not place this medication, or any other prescription from our practice, on "Automatic Refill". Patient may have prescription filled one day early if pharmacy is closed on scheduled refill date. Do not fill until: 08/12/2017 To last until:09/11/2017    Order Specific Question:   Supervising Provider    Answer:   Milinda Pointer [phone]   New Prescriptions   No medications on file   Medications administered today: Erika L. Delgreco had no medications administered during this visit. Lab-work, procedure(s), and/or referral(s): Orders Placed This Encounter  Procedures  . DG HIP UNILAT W OR W/O PELVIS 2-3 VIEWS RIGHT  . ToxASSURE Select 13 (MW), Urine   Imaging and/or referral(s): None  Interventional management options: Planned, scheduled, and/or pending: Not at this time   Considering: Diagnostic right-sided intra-articular knee joint injection Possible series of 5 right-sided intra-articular Hyalgan knee injections Diagnostic right-sided genicular nerve block Possible right-sided genicular nerve RFA Diagnostic left cervical epidural steroid injection Diagnostic bilateral cervical facet blocks Diagnostic bilateral intra-articular shoulder joint injection Diagnostic bilateral suprascapular nerve block Possible bilateral suprascapular nerve RFA Diagnostic bilateral lumbar facet block Possible bilateral lumbar facet RFA   Palliative PRN treatment(s): None at this time      Provider-requested follow-up: Return in about 4 weeks (around 09/09/2017) for MedMgmt with Me Donella Stade [facility] Pace).  Future Appointments  Date Time Provider [place]   09/05/2017 11:00 AM [facility] PAT 4 [facility] None  09/08/2017  9:15 AM Vevelyn Francois, NP [facility] None   Primary Care Physician: Wenda Low, MD Location: [facility] Note by: Vevelyn Francois NP Date: 08/12/2017; Time: 3:05 PM  Pain Score Disclaimer: We use the NRS-11 scale. This is a self-reported, subjective measurement of pain severity with only modest accuracy. It is used primarily to identify changes within a particular patient. It must be understood that outpatient pain scales are significantly less accurate that those used for research, where they can be applied under ideal controlled circumstances with minimal exposure to variables. In reality, the score is likely to be a combination of pain intensity and pain affect, where pain affect describes the degree of emotional arousal or changes in action readiness caused by the sensory experience of pain. Factors such as social and work situation, setting, emotional state, anxiety levels, expectation, and prior pain experience may influence pain perception and show large inter-individual differences that may also be affected by time variables.  Patient instructions provided during this appointment: Patient Instructions   You have been given Rx for Vicodin to last until 09/11/2017.  Rx for Voltaren cream and gabapentin have been escribed to your pharmacy.  You will go to [place] for xrays as ordered prior to next appt. with pain clinic.____________________________________________________________________________________________  Medication Rules  Applies to: All patients receiving prescriptions (written or electronic).  Pharmacy of record: Pharmacy where electronic prescriptions will be sent. If written prescriptions are taken to a different pharmacy, please inform the nursing staff. The pharmacy listed in the electronic medical record should be the one where you would like electronic prescriptions to  be sent.  Prescription refills: Only during scheduled appointments. Applies to both, written and electronic prescriptions.  NOTE: The following applies primarily to controlled substances (Opioid* Pain Medications).   Patient's responsibilities: 1. Pain Pills: Bring all pain pills to  every appointment (except for procedure appointments). 2. Pill Bottles: Bring pills in original pharmacy bottle. Always bring newest bottle. Bring bottle, even if empty. 3. Medication refills: You are responsible for knowing and keeping track of what medications you need refilled. The day before your appointment, write a list of all prescriptions that need to be refilled. Bring that list to your appointment and give it to the admitting nurse. Prescriptions will be written only during appointments. If you forget a medication, it will not be "Called in", "Faxed", or "electronically sent". You will need to get another appointment to get these prescribed. 4. Prescription Accuracy: You are responsible for carefully inspecting your prescriptions before leaving our office. Have the discharge nurse carefully go over each prescription with you, before taking them home. Make sure that your name is accurately spelled, that your address is correct. Check the name and dose of your medication to make sure it is accurate. Check the number of pills, and the written instructions to make sure they are clear and accurate. Make sure that you are given enough medication to last until your next medication refill appointment. 5. Taking Medication: Take medication as prescribed. Never take more pills than instructed. Never take medication more frequently than prescribed. Taking less pills or less frequently is permitted and encouraged, when it comes to controlled substances (written prescriptions).  6. Inform other Doctors: Always inform, all of your healthcare providers, of all the medications you take. 7. Pain Medication from other Providers: You  are not allowed to accept any additional pain medication from any other Doctor or Healthcare provider. There are two exceptions to this rule. (see below) In the event that you require additional pain medication, you are responsible for notifying us, as stated below. 8. Medication Agreement: You are responsible for carefully reading and following our Medication Agreement. This must be signed before receiving any prescriptions from our practice. Safely store a copy of your signed Agreement. Violations to the Agreement will result in no further prescriptions. (Additional copies of our Medication Agreement are available upon request.) 9. Laws, Rules, & Regulations: All patients are expected to follow all Federal and [employer], Rules, [employer]. Ignorance of the Laws does not constitute a valid excuse. The use of any illegal substances is prohibited. 10. Adopted CDC guidelines & recommendations: Target dosing levels will be at or below 60 MME/day. Use of benzodiazepines** is not recommended.  Exceptions: There are only two exceptions to the rule of not receiving pain medications from other Healthcare Providers. 1. Exception #1 (Emergencies): In the event of an emergency (i.e.: accident requiring emergency care), you are allowed to receive additional pain medication. However, you are responsible for: As soon as you are able, call our office [phone], at any time of the day or night, and leave a message stating your name, the date and nature of the emergency, and the name and dose of the medication prescribed. In the event that your call is answered by a member of our staff, make sure to document and save the date, time, and the name of the person that took your information.  2. Exception #2 (Planned Surgery): In the event that you are scheduled by another doctor or dentist to have any type of surgery or procedure, you are allowed (for a period no longer than 30 days), to receive additional pain  medication, for the acute post-op pain. However, in this case, you are responsible for picking up a copy of our "Post-op Pain Management  for Surgeons" handout, and giving it to your surgeon or dentist. This document is available at our office, and does not require an appointment to obtain it. Simply go to our office during business hours (Monday-Thursday from 8:00 AM to 4:00 PM) (Friday 8:00 AM to 12:00 Noon) or if you have a scheduled appointment with [place], prior to your surgery, and ask for it by name. In addition, you will need to provide [place] with your name, name of your surgeon, type of surgery, and date of procedure or surgery.  *Opioid medications include: morphine, codeine, oxycodone, oxymorphone, hydrocodone, hydromorphone, meperidine, tramadol, tapentadol, buprenorphine, fentanyl, methadone. **Benzodiazepine medications include: diazepam (Valium), alprazolam (Xanax), clonazepam (Klonopine), lorazepam (Ativan), clorazepate (Tranxene), chlordiazepoxide (Librium), estazolam (Prosom), oxazepam (Serax), temazepam (Restoril), triazolam (Halcion) (Last updated: 07/24/2017) ____________________________________________________________________________________________   BMI Assessment: Estimated body mass index is 30.62 kg/m as calculated from the following:   Height as of this encounter: '5\' 5"'  (1.651 m).   Weight as of this encounter: 184 lb (83.5 kg).  BMI interpretation table: BMI level Category Range association with higher incidence of chronic pain  <18 kg/m2 Underweight   18.5-24.9 kg/m2 Ideal body weight   25-29.9 kg/m2 Overweight Increased incidence by 20%  30-34.9 kg/m2 Obese (Class I) Increased incidence by 68%  35-39.9 kg/m2 Severe obesity (Class II) Increased incidence by 136%  >40 kg/m2 Extreme obesity (Class III) Increased incidence by 254%   BMI Readings from Last 4 Encounters:  08/12/17 30.62 kg/m  07/17/17 31.45 kg/m  07/10/17 30.79 kg/m  07/03/17 30.95 kg/m   Wt Readings  from Last 4 Encounters:  08/12/17 184 lb (83.5 kg)  07/17/17 189 lb (85.7 kg)  07/10/17 185 lb (83.9 kg)  07/03/17 186 lb (84.4 kg)   ____________________________________________________________________________________________  Pain Scale  Introduction: The pain score used by this practice is the Verbal Numerical Rating Scale (VNRS-11). This is an 11-point scale. It is for adults and children 10 years or older. There are significant differences in how the pain score is reported, used, and applied. Forget everything you learned in the past and learn this scoring system.  General Information: The scale should reflect your current level of pain. Unless you are specifically asked for the level of your worst pain, or your average pain. If you are asked for one of these two, then it should be understood that it is over the past 24 hours.  Basic Activities of Daily Living (ADL): Personal hygiene, dressing, eating, transferring, and using restroom.  Instructions: Most patients tend to report their level of pain as a combination of two factors, their physical pain and their psychosocial pain. This last one is also known as "suffering" and it is reflection of how physical pain affects you socially and psychologically. From now on, report them separately. From this point on, when asked to report your pain level, report only your physical pain. Use the following table for reference.  Pain Clinic Pain Levels (0-5/10)  Pain Level Score  Description  No Pain 0   Mild pain 1 Nagging, annoying, but does not interfere with basic activities of daily living (ADL). Patients are able to eat, bathe, get dressed, toileting (being able to get on and off the toilet and perform personal hygiene functions), transfer (move in and out of bed or a chair without assistance), and maintain continence (able to control bladder and bowel functions). Blood pressure and heart rate are unaffected. A normal heart rate for a healthy  adult ranges from 60 to 100 bpm (beats per  minute).   Mild to moderate pain 2 Noticeable and distracting. Impossible to hide from other people. More frequent flare-ups. Still possible to adapt and function close to normal. It can be very annoying and may have occasional stronger flare-ups. With discipline, patients may get used to it and adapt.   Moderate pain 3 Interferes significantly with activities of daily living (ADL). It becomes difficult to feed, bathe, get dressed, get on and off the toilet or to perform personal hygiene functions. Difficult to get in and out of bed or a chair without assistance. Very distracting. With effort, it can be ignored when deeply involved in activities.   Moderately severe pain 4 Impossible to ignore for more than a few minutes. With effort, patients may still be able to manage work or participate in some social activities. Very difficult to concentrate. Signs of autonomic nervous system discharge are evident: dilated pupils (mydriasis); mild sweating (diaphoresis); sleep interference. Heart rate becomes elevated (>115 bpm). Diastolic blood pressure (lower number) rises above 100 mmHg. Patients find relief in laying down and not moving.   Severe pain 5 Intense and extremely unpleasant. Associated with frowning face and frequent crying. Pain overwhelms the senses.  Ability to do any activity or maintain social relationships becomes significantly limited. Conversation becomes difficult. Pacing back and forth is common, as getting into a comfortable position is nearly impossible. Pain wakes you up from deep sleep. Physical signs will be obvious: pupillary dilation; increased sweating; goosebumps; brisk reflexes; cold, clammy hands and feet; nausea, vomiting or dry heaves; loss of appetite; significant sleep disturbance with inability to fall asleep or to remain asleep. When persistent, significant weight loss is observed due to the complete loss of appetite and sleep  deprivation.  Blood pressure and heart rate becomes significantly elevated. Caution: If elevated blood pressure triggers a pounding headache associated with blurred vision, then the patient should immediately seek attention at an urgent or emergency care unit, as these may be signs of an impending stroke.    Emergency Department Pain Levels (6-10/10)  Emergency Room Pain 6 Severely limiting. Requires emergency care and should not be seen or managed at an outpatient pain management facility. Communication becomes difficult and requires great effort. Assistance to reach the emergency department may be required. Facial flushing and profuse sweating along with potentially dangerous increases in heart rate and blood pressure will be evident.   Distressing pain 7 Self-care is very difficult. Assistance is required to transport, or use restroom. Assistance to reach the emergency department will be required. Tasks requiring coordination, such as bathing and getting dressed become very difficult.   Disabling pain 8 Self-care is no longer possible. At this level, pain is disabling. The individual is unable to do even the most "basic" activities such as walking, eating, bathing, dressing, transferring to a bed, or toileting. Fine motor skills are lost. It is difficult to think clearly.   Incapacitating pain 9 Pain becomes incapacitating. Thought processing is no longer possible. Difficult to remember your own name. Control of movement and coordination are lost.   The worst pain imaginable 10 At this level, most patients pass out from pain. When this level is reached, collapse of the autonomic nervous system occurs, leading to a sudden drop in blood pressure and heart rate. This in turn results in a temporary and dramatic drop in blood flow to the brain, leading to a loss of consciousness. Fainting is one of the body's self defense mechanisms. Passing out puts the brain in a  calmed state and causes it to shut down  for a while, in order to begin the healing process.    Summary: 1. Refer to this scale when providing [place] with your pain level. 2. Be accurate and careful when reporting your pain level. This will help with your care. 3. Over-reporting your pain level will lead to loss of credibility. 4. Even a level of 1/10 means that there is pain and will be treated at our facility. 5. High, inaccurate reporting will be documented as "Symptom Exaggeration", leading to loss of credibility and suspicions of possible secondary gains such as obtaining more narcotics, or wanting to appear disabled, for fraudulent reasons. 6. Only pain levels of 5 or below will be seen at our facility. 7. Pain levels of 6 and above will be sent to the Emergency Department and the appointment cancelled. ____________________________________________________________________________________________

## 2017-08-12 NOTE — Patient Instructions (Addendum)
You have been given Rx for Vicodin to last until 09/11/2017.  Rx for Voltaren cream and gabapentin have been escribed to your pharmacy.  You will go to [place] for xrays as ordered prior to next appt. with pain clinic.____________________________________________________________________________________________  Medication Rules  Applies to: All patients receiving prescriptions (written or electronic).  Pharmacy of record: Pharmacy where electronic prescriptions will be sent. If written prescriptions are taken to a different pharmacy, please inform the nursing staff. The pharmacy listed in the electronic medical record should be the one where you would like electronic prescriptions to be sent.  Prescription refills: Only during scheduled appointments. Applies to both, written and electronic prescriptions.  NOTE: The following applies primarily to controlled substances (Opioid* Pain Medications).   Patient's responsibilities: 1. Pain Pills: Bring all pain pills to every appointment (except for procedure appointments). 2. Pill Bottles: Bring pills in original pharmacy bottle. Always bring newest bottle. Bring bottle, even if empty. 3. Medication refills: You are responsible for knowing and keeping track of what medications you need refilled. The day before your appointment, write a list of all prescriptions that need to be refilled. Bring that list to your appointment and give it to the admitting nurse. Prescriptions will be written only during appointments. If you forget a medication, it will not be "Called in", "Faxed", or "electronically sent". You will need to get another appointment to get these prescribed. 4. Prescription Accuracy: You are responsible for carefully inspecting your prescriptions before leaving our office. Have the discharge nurse carefully go over each prescription with you, before taking them home. Make sure that your name is accurately spelled, that your address is  correct. Check the name and dose of your medication to make sure it is accurate. Check the number of pills, and the written instructions to make sure they are clear and accurate. Make sure that you are given enough medication to last until your next medication refill appointment. 5. Taking Medication: Take medication as prescribed. Never take more pills than instructed. Never take medication more frequently than prescribed. Taking less pills or less frequently is permitted and encouraged, when it comes to controlled substances (written prescriptions).  6. Inform other Doctors: Always inform, all of your healthcare providers, of all the medications you take. 7. Pain Medication from other Providers: You are not allowed to accept any additional pain medication from any other Doctor or Healthcare provider. There are two exceptions to this rule. (see below) In the event that you require additional pain medication, you are responsible for notifying us, as stated below. 8. Medication Agreement: You are responsible for carefully reading and following our Medication Agreement. This must be signed before receiving any prescriptions from our practice. Safely store a copy of your signed Agreement. Violations to the Agreement will result in no further prescriptions. (Additional copies of our Medication Agreement are available upon request.) 9. Laws, Rules, & Regulations: All patients are expected to follow all Federal and [employer], [employer], Rules, [employer]. Ignorance of the Laws does not constitute a valid excuse. The use of any illegal substances is prohibited. 10. Adopted CDC guidelines & recommendations: Target dosing levels will be at or below 60 MME/day. Use of benzodiazepines** is not recommended.  Exceptions: There are only two exceptions to the rule of not receiving pain medications from other Healthcare Providers. 1. Exception #1 (Emergencies): In the event of an emergency (i.e.: accident requiring  emergency care), you are allowed to receive additional pain medication. However, you are responsible  for: As soon as you are able, call our office [phone], at any time of the day or night, and leave a message stating your name, the date and nature of the emergency, and the name and dose of the medication prescribed. In the event that your call is answered by a member of our staff, make sure to document and save the date, time, and the name of the person that took your information.  2. Exception #2 (Planned Surgery): In the event that you are scheduled by another doctor or dentist to have any type of surgery or procedure, you are allowed (for a period no longer than 30 days), to receive additional pain medication, for the acute post-op pain. However, in this case, you are responsible for picking up a copy of our "Post-op Pain Management for Surgeons" handout, and giving it to your surgeon or dentist. This document is available at our office, and does not require an appointment to obtain it. Simply go to our office during business hours (Monday-Thursday from 8:00 AM to 4:00 PM) (Friday 8:00 AM to 12:00 Noon) or if you have a scheduled appointment with [place], prior to your surgery, and ask for it by name. In addition, you will need to provide [place] with your name, name of your surgeon, type of surgery, and date of procedure or surgery.  *Opioid medications include: morphine, codeine, oxycodone, oxymorphone, hydrocodone, hydromorphone, meperidine, tramadol, tapentadol, buprenorphine, fentanyl, methadone. **Benzodiazepine medications include: diazepam (Valium), alprazolam (Xanax), clonazepam (Klonopine), lorazepam (Ativan), clorazepate (Tranxene), chlordiazepoxide (Librium), estazolam (Prosom), oxazepam (Serax), temazepam (Restoril), triazolam (Halcion) (Last updated: 07/24/2017) ____________________________________________________________________________________________   BMI Assessment: Estimated body mass  index is 30.62 kg/m as calculated from the following:   Height as of this encounter: 5\' 5"  (1.651 m).   Weight as of this encounter: 184 lb (83.5 kg).  BMI interpretation table: BMI level Category Range association with higher incidence of chronic pain  <18 kg/m2 Underweight   18.5-24.9 kg/m2 Ideal body weight   25-29.9 kg/m2 Overweight Increased incidence by 20%  30-34.9 kg/m2 Obese (Class I) Increased incidence by 68%  35-39.9 kg/m2 Severe obesity (Class II) Increased incidence by 136%  >40 kg/m2 Extreme obesity (Class III) Increased incidence by 254%   BMI Readings from Last 4 Encounters:  08/12/17 30.62 kg/m  07/17/17 31.45 kg/m  07/10/17 30.79 kg/m  07/03/17 30.95 kg/m   Wt Readings from Last 4 Encounters:  08/12/17 184 lb (83.5 kg)  07/17/17 189 lb (85.7 kg)  07/10/17 185 lb (83.9 kg)  07/03/17 186 lb (84.4 kg)   ____________________________________________________________________________________________  Pain Scale  Introduction: The pain score used by this practice is the Verbal Numerical Rating Scale (VNRS-11). This is an 11-point scale. It is for adults and children 10 years or older. There are significant differences in how the pain score is reported, used, and applied. Forget everything you learned in the past and learn this scoring system.  General Information: The scale should reflect your current level of pain. Unless you are specifically asked for the level of your worst pain, or your average pain. If you are asked for one of these two, then it should be understood that it is over the past 24 hours.  Basic Activities of Daily Living (ADL): Personal hygiene, dressing, eating, transferring, and using restroom.  Instructions: Most patients tend to report their level of pain as a combination of two factors, their physical pain and their psychosocial pain. This last one is also known as "suffering" and it is reflection  of how physical pain affects you socially and  psychologically. From now on, report them separately. From this point on, when asked to report your pain level, report only your physical pain. Use the following table for reference.  Pain Clinic Pain Levels (0-5/10)  Pain Level Score  Description  No Pain 0   Mild pain 1 Nagging, annoying, but does not interfere with basic activities of daily living (ADL). Patients are able to eat, bathe, get dressed, toileting (being able to get on and off the toilet and perform personal hygiene functions), transfer (move in and out of bed or a chair without assistance), and maintain continence (able to control bladder and bowel functions). Blood pressure and heart rate are unaffected. A normal heart rate for a healthy adult ranges from 60 to 100 bpm (beats per minute).   Mild to moderate pain 2 Noticeable and distracting. Impossible to hide from other people. More frequent flare-ups. Still possible to adapt and function close to normal. It can be very annoying and may have occasional stronger flare-ups. With discipline, patients may get used to it and adapt.   Moderate pain 3 Interferes significantly with activities of daily living (ADL). It becomes difficult to feed, bathe, get dressed, get on and off the toilet or to perform personal hygiene functions. Difficult to get in and out of bed or a chair without assistance. Very distracting. With effort, it can be ignored when deeply involved in activities.   Moderately severe pain 4 Impossible to ignore for more than a few minutes. With effort, patients may still be able to manage work or participate in some social activities. Very difficult to concentrate. Signs of autonomic nervous system discharge are evident: dilated pupils (mydriasis); mild sweating (diaphoresis); sleep interference. Heart rate becomes elevated (>115 bpm). Diastolic blood pressure (lower number) rises above 100 mmHg. Patients find relief in laying down and not moving.   Severe pain 5 Intense and  extremely unpleasant. Associated with frowning face and frequent crying. Pain overwhelms the senses.  Ability to do any activity or maintain social relationships becomes significantly limited. Conversation becomes difficult. Pacing back and forth is common, as getting into a comfortable position is nearly impossible. Pain wakes you up from deep sleep. Physical signs will be obvious: pupillary dilation; increased sweating; goosebumps; brisk reflexes; cold, clammy hands and feet; nausea, vomiting or dry heaves; loss of appetite; significant sleep disturbance with inability to fall asleep or to remain asleep. When persistent, significant weight loss is observed due to the complete loss of appetite and sleep deprivation.  Blood pressure and heart rate becomes significantly elevated. Caution: If elevated blood pressure triggers a pounding headache associated with blurred vision, then the patient should immediately seek attention at an urgent or emergency care unit, as these may be signs of an impending stroke.    Emergency Department Pain Levels (6-10/10)  Emergency Room Pain 6 Severely limiting. Requires emergency care and should not be seen or managed at an outpatient pain management facility. Communication becomes difficult and requires great effort. Assistance to reach the emergency department may be required. Facial flushing and profuse sweating along with potentially dangerous increases in heart rate and blood pressure will be evident.   Distressing pain 7 Self-care is very difficult. Assistance is required to transport, or use restroom. Assistance to reach the emergency department will be required. Tasks requiring coordination, such as bathing and getting dressed become very difficult.   Disabling pain 8 Self-care is no longer possible. At this level, pain  is disabling. The individual is unable to do even the most "basic" activities such as walking, eating, bathing, dressing, transferring to a bed, or  toileting. Fine motor skills are lost. It is difficult to think clearly.   Incapacitating pain 9 Pain becomes incapacitating. Thought processing is no longer possible. Difficult to remember your own name. Control of movement and coordination are lost.   The worst pain imaginable 10 At this level, most patients pass out from pain. When this level is reached, collapse of the autonomic nervous system occurs, leading to a sudden drop in blood pressure and heart rate. This in turn results in a temporary and dramatic drop in blood flow to the brain, leading to a loss of consciousness. Fainting is one of the body's self defense mechanisms. Passing out puts the brain in a calmed state and causes it to shut down for a while, in order to begin the healing process.    Summary: 1. Refer to this scale when providing [place] with your pain level. 2. Be accurate and careful when reporting your pain level. This will help with your care. 3. Over-reporting your pain level will lead to loss of credibility. 4. Even a level of 1/10 means that there is pain and will be treated at our facility. 5. High, inaccurate reporting will be documented as "Symptom Exaggeration", leading to loss of credibility and suspicions of possible secondary gains such as obtaining more narcotics, or wanting to appear disabled, for fraudulent reasons. 6. Only pain levels of 5 or below will be seen at our facility. 7. Pain levels of 6 and above will be sent to the Emergency Department and the appointment cancelled. ____________________________________________________________________________________________

## 2017-08-12 NOTE — Progress Notes (Signed)
Nursing Pain Medication Assessment:  Safety precautions to be maintained throughout the outpatient stay will include: orient to surroundings, keep bed in low position, maintain call bell within reach at all times, provide assistance with transfer out of bed and ambulation.  Medication Inspection Compliance: Pill count conducted under aseptic conditions, in front of the patient. Neither the pills nor the bottle was removed from the patient's sight at any time. Once count was completed pills were immediately returned to the patient in their original bottle.  Medication: Hydrocodone/APAP Pill/Patch Count: 0 of 120 pills remain Pill/Patch Appearance: Markings consistent with prescribed medication Bottle Appearance: Standard pharmacy container. Clearly labeled. Filled Date: 44 / 66 / 2018 Last Medication intake:  Ran out of medicine more than 48 hours ago

## 2017-08-13 ENCOUNTER — Other Ambulatory Visit: Payer: Self-pay | Admitting: Nurse Practitioner

## 2017-08-13 ENCOUNTER — Telehealth: Payer: Self-pay | Admitting: *Deleted

## 2017-08-13 ENCOUNTER — Encounter: Payer: Self-pay | Admitting: Nurse Practitioner

## 2017-08-13 DIAGNOSIS — R937 Abnormal findings on diagnostic imaging of other parts of musculoskeletal system: Secondary | ICD-10-CM | POA: Insufficient documentation

## 2017-08-13 DIAGNOSIS — W19XXXA Unspecified fall, initial encounter: Secondary | ICD-10-CM

## 2017-08-13 DIAGNOSIS — M25551 Pain in right hip: Principal | ICD-10-CM

## 2017-08-13 DIAGNOSIS — M5388 Other specified dorsopathies, sacral and sacrococcygeal region: Secondary | ICD-10-CM

## 2017-08-13 DIAGNOSIS — G8929 Other chronic pain: Secondary | ICD-10-CM

## 2017-08-13 NOTE — Telephone Encounter (Signed)
[place], patient's daughter with results.  Will have secretaries schedule patient for CT scan of sacrum.

## 2017-08-13 NOTE — Progress Notes (Signed)
Results were reviewed and found to be: abnormal  Further testing may be useful  Review would suggest no procedures needed at this time

## 2017-08-14 ENCOUNTER — Encounter: Payer: Medicare Other | Admitting: Physical Therapy

## 2017-08-18 ENCOUNTER — Encounter: Payer: Medicare Other | Admitting: Physical Therapy

## 2017-08-18 LAB — TOXASSURE SELECT 13 (MW), URINE

## 2017-08-20 ENCOUNTER — Encounter: Payer: Medicare Other | Admitting: Physical Therapy

## 2017-08-24 ENCOUNTER — Inpatient Hospital Stay (HOSPITAL_COMMUNITY): Payer: Medicare Other

## 2017-08-24 ENCOUNTER — Emergency Department (HOSPITAL_COMMUNITY): Payer: Medicare Other

## 2017-08-24 ENCOUNTER — Inpatient Hospital Stay (HOSPITAL_COMMUNITY)
Admission: EM | Admit: 2017-08-24 | Discharge: 2017-08-25 | DRG: 390 | Disposition: A | Discharge status: Home / Self Care | Payer: Medicare Other | Attending: Family Medicine | Admitting: Family Medicine

## 2017-08-24 ENCOUNTER — Other Ambulatory Visit: Payer: Self-pay

## 2017-08-24 ENCOUNTER — Encounter (HOSPITAL_COMMUNITY): Payer: Self-pay | Admitting: Student

## 2017-08-24 DIAGNOSIS — K56609 Unspecified intestinal obstruction, unspecified as to partial versus complete obstruction: Secondary | ICD-10-CM | POA: Diagnosis present

## 2017-08-24 DIAGNOSIS — G4700 Insomnia, unspecified: Secondary | ICD-10-CM | POA: Diagnosis not present

## 2017-08-24 DIAGNOSIS — E869 Volume depletion, unspecified: Secondary | ICD-10-CM | POA: Diagnosis present

## 2017-08-24 DIAGNOSIS — H5462 Unqualified visual loss, left eye, normal vision right eye: Secondary | ICD-10-CM | POA: Diagnosis present

## 2017-08-24 DIAGNOSIS — M5136 Other intervertebral disc degeneration, lumbar region: Secondary | ICD-10-CM | POA: Diagnosis present

## 2017-08-24 DIAGNOSIS — R1010 Upper abdominal pain, unspecified: Secondary | ICD-10-CM | POA: Diagnosis not present

## 2017-08-24 DIAGNOSIS — N183 Chronic kidney disease, stage 3 unspecified: Secondary | ICD-10-CM | POA: Diagnosis present

## 2017-08-24 DIAGNOSIS — I10 Essential (primary) hypertension: Secondary | ICD-10-CM

## 2017-08-24 DIAGNOSIS — K565 Intestinal adhesions [bands], unspecified as to partial versus complete obstruction: Secondary | ICD-10-CM | POA: Diagnosis present

## 2017-08-24 DIAGNOSIS — Z90710 Acquired absence of both cervix and uterus: Secondary | ICD-10-CM

## 2017-08-24 DIAGNOSIS — J449 Chronic obstructive pulmonary disease, unspecified: Secondary | ICD-10-CM | POA: Diagnosis present

## 2017-08-24 DIAGNOSIS — M810 Age-related osteoporosis without current pathological fracture: Secondary | ICD-10-CM | POA: Diagnosis present

## 2017-08-24 DIAGNOSIS — K5909 Other constipation: Secondary | ICD-10-CM | POA: Diagnosis present

## 2017-08-24 DIAGNOSIS — G894 Chronic pain syndrome: Secondary | ICD-10-CM | POA: Diagnosis present

## 2017-08-24 DIAGNOSIS — Z9049 Acquired absence of other specified parts of digestive tract: Secondary | ICD-10-CM | POA: Diagnosis not present

## 2017-08-24 DIAGNOSIS — I1310 Hypertensive heart and chronic kidney disease without heart failure, with stage 1 through stage 4 chronic kidney disease, or unspecified chronic kidney disease: Secondary | ICD-10-CM | POA: Diagnosis present

## 2017-08-24 DIAGNOSIS — Z91048 Other nonmedicinal substance allergy status: Secondary | ICD-10-CM

## 2017-08-24 DIAGNOSIS — Z882 Allergy status to sulfonamides status: Secondary | ICD-10-CM | POA: Diagnosis not present

## 2017-08-24 DIAGNOSIS — Z87891 Personal history of nicotine dependence: Secondary | ICD-10-CM | POA: Diagnosis not present

## 2017-08-24 DIAGNOSIS — K219 Gastro-esophageal reflux disease without esophagitis: Secondary | ICD-10-CM | POA: Diagnosis present

## 2017-08-24 DIAGNOSIS — Z7983 Long term (current) use of bisphosphonates: Secondary | ICD-10-CM

## 2017-08-24 DIAGNOSIS — Z8673 Personal history of transient ischemic attack (TIA), and cerebral infarction without residual deficits: Secondary | ICD-10-CM | POA: Diagnosis not present

## 2017-08-24 DIAGNOSIS — Z880 Allergy status to penicillin: Secondary | ICD-10-CM

## 2017-08-24 DIAGNOSIS — F329 Major depressive disorder, single episode, unspecified: Secondary | ICD-10-CM | POA: Diagnosis not present

## 2017-08-24 DIAGNOSIS — E785 Hyperlipidemia, unspecified: Secondary | ICD-10-CM | POA: Diagnosis not present

## 2017-08-24 DIAGNOSIS — Z888 Allergy status to other drugs, medicaments and biological substances status: Secondary | ICD-10-CM

## 2017-08-24 DIAGNOSIS — Z79891 Long term (current) use of opiate analgesic: Secondary | ICD-10-CM

## 2017-08-24 DIAGNOSIS — Z791 Long term (current) use of non-steroidal anti-inflammatories (NSAID): Secondary | ICD-10-CM | POA: Diagnosis not present

## 2017-08-24 DIAGNOSIS — M25551 Pain in right hip: Secondary | ICD-10-CM | POA: Diagnosis present

## 2017-08-24 DIAGNOSIS — M25512 Pain in left shoulder: Secondary | ICD-10-CM | POA: Diagnosis present

## 2017-08-24 DIAGNOSIS — M1990 Unspecified osteoarthritis, unspecified site: Secondary | ICD-10-CM | POA: Diagnosis present

## 2017-08-24 DIAGNOSIS — Z8249 Family history of ischemic heart disease and other diseases of the circulatory system: Secondary | ICD-10-CM

## 2017-08-24 DIAGNOSIS — Z79899 Other long term (current) drug therapy: Secondary | ICD-10-CM

## 2017-08-24 DIAGNOSIS — R739 Hyperglycemia, unspecified: Secondary | ICD-10-CM | POA: Diagnosis present

## 2017-08-24 DIAGNOSIS — M25561 Pain in right knee: Secondary | ICD-10-CM | POA: Diagnosis present

## 2017-08-24 DIAGNOSIS — Z4659 Encounter for fitting and adjustment of other gastrointestinal appliance and device: Secondary | ICD-10-CM

## 2017-08-24 DIAGNOSIS — Z885 Allergy status to narcotic agent status: Secondary | ICD-10-CM

## 2017-08-24 LAB — CBC WITH DIFFERENTIAL/PLATELET
Basophils Absolute: 0 10*3/uL (ref 0.0–0.1)
Basophils Relative: 0 %
Eosinophils Absolute: 0.1 10*3/uL (ref 0.0–0.7)
Eosinophils Relative: 1 %
HCT: 46.7 % — ABNORMAL HIGH (ref 36.0–46.0)
Hemoglobin: 15.3 g/dL — ABNORMAL HIGH (ref 12.0–15.0)
Lymphocytes Relative: 24 %
Lymphs Abs: 1.9 10*3/uL (ref 0.7–4.0)
MCH: 31.2 pg (ref 26.0–34.0)
MCHC: 32.8 g/dL (ref 30.0–36.0)
MCV: 95.3 fL (ref 78.0–100.0)
Monocytes Absolute: 0.7 10*3/uL (ref 0.1–1.0)
Monocytes Relative: 9 %
Neutro Abs: 5.4 10*3/uL (ref 1.7–7.7)
Neutrophils Relative %: 66 %
Platelets: 214 10*3/uL (ref 150–400)
RBC: 4.9 MIL/uL (ref 3.87–5.11)
RDW: 15 % (ref 11.5–15.5)
WBC: 8.1 10*3/uL (ref 4.0–10.5)

## 2017-08-24 LAB — CREATININE, SERUM
Creatinine, Ser: 0.88 mg/dL (ref 0.44–1.00)
GFR calc Af Amer: >60 mL/min (ref >60)
GFR calc non Af Amer: >60 mL/min (ref >60)

## 2017-08-24 LAB — COMPREHENSIVE METABOLIC PANEL
ALT: 44 U/L (ref 14–54)
AST: 45 U/L — ABNORMAL HIGH (ref 15–41)
Albumin: 3.9 g/dL (ref 3.5–5.0)
Alkaline Phosphatase: 105 U/L (ref 38–126)
Anion gap: 11 (ref 5–15)
BUN: 5 mg/dL — ABNORMAL LOW (ref 6–20)
CO2: 24 mmol/L (ref 22–32)
Calcium: 9.9 mg/dL (ref 8.9–10.3)
Chloride: 108 mmol/L (ref 101–111)
Creatinine, Ser: 1.08 mg/dL — ABNORMAL HIGH (ref 0.44–1.00)
GFR calc Af Amer: 56 mL/min — ABNORMAL LOW (ref >60)
GFR calc non Af Amer: 49 mL/min — ABNORMAL LOW (ref >60)
Glucose, Bld: 111 mg/dL — ABNORMAL HIGH (ref 65–99)
Potassium: 3.5 mmol/L (ref 3.5–5.1)
Sodium: 143 mmol/L (ref 135–145)
Total Bilirubin: 0.7 mg/dL (ref 0.3–1.2)
Total Protein: 7.2 g/dL (ref 6.5–8.1)

## 2017-08-24 LAB — CBC
HCT: 44.1 % (ref 36.0–46.0)
Hemoglobin: 13.9 g/dL (ref 12.0–15.0)
MCH: 30.3 pg (ref 26.0–34.0)
MCHC: 31.5 g/dL (ref 30.0–36.0)
MCV: 96.1 fL (ref 78.0–100.0)
Platelets: 199 10*3/uL (ref 150–400)
RBC: 4.59 MIL/uL (ref 3.87–5.11)
RDW: 14.8 % (ref 11.5–15.5)
WBC: 6.2 10*3/uL (ref 4.0–10.5)

## 2017-08-24 LAB — URINALYSIS, ROUTINE W REFLEX MICROSCOPIC
Bilirubin Urine: NEGATIVE
Glucose, UA: NEGATIVE mg/dL
Hgb urine dipstick: NEGATIVE
Ketones, ur: NEGATIVE mg/dL
Leukocytes, UA: NEGATIVE
Nitrite: NEGATIVE
Protein, ur: NEGATIVE mg/dL
Specific Gravity, Urine: 1.042 — ABNORMAL HIGH (ref 1.005–1.030)
pH: 7 (ref 5.0–8.0)

## 2017-08-24 LAB — I-STAT TROPONIN, ED: Troponin i, poc: 0.01 ng/mL (ref 0.00–0.08)

## 2017-08-24 LAB — LIPASE, BLOOD: Lipase: 22 U/L (ref 11–51)

## 2017-08-24 MED ORDER — BISACODYL 10 MG RE SUPP: 10.0000 mg | Freq: Every day | RECTAL | Status: DC | PRN

## 2017-08-24 MED ORDER — ACETAMINOPHEN 325 MG PO TABS: 650.0000 mg | ORAL_TABLET | Freq: Four times a day (QID) | ORAL | Status: DC | PRN

## 2017-08-24 MED ORDER — ENOXAPARIN SODIUM 40 MG/0.4ML ~~LOC~~ SOLN
40.0000 mg | SUBCUTANEOUS | Status: DC
  Administered 2017-08-24: 40 mg via SUBCUTANEOUS
  Filled 2017-08-24: qty 0.4

## 2017-08-24 MED ORDER — FLEET ENEMA 7-19 GM/118ML RE ENEM: 1.0000 | ENEMA | Freq: Once | RECTAL | Status: DC | PRN

## 2017-08-24 MED ORDER — IOPAMIDOL (ISOVUE-300) INJECTION 61%
INTRAVENOUS | Status: AC
  Administered 2017-08-24: 80 mL
  Filled 2017-08-24: qty 100

## 2017-08-24 MED ORDER — PANTOPRAZOLE SODIUM 40 MG IV SOLR
40.0000 mg | Freq: Two times a day (BID) | INTRAVENOUS | Status: DC
  Administered 2017-08-24 (×2): 40 mg via INTRAVENOUS
  Filled 2017-08-24 (×2): qty 40

## 2017-08-24 MED ORDER — FENTANYL CITRATE (PF) 100 MCG/2ML IJ SOLN
12.5000 ug | INTRAMUSCULAR | Status: DC | PRN
  Administered 2017-08-24 – 2017-08-25 (×5): 12.5 ug via INTRAVENOUS
  Filled 2017-08-24 (×5): qty 2

## 2017-08-24 MED ORDER — MENTHOL (TOPICAL ANALGESIC) 4 % EX GEL: Freq: Every day | CUTANEOUS | Status: DC | PRN

## 2017-08-24 MED ORDER — ALBUTEROL SULFATE (2.5 MG/3ML) 0.083% IN NEBU: 2.5000 mg | INHALATION_SOLUTION | Freq: Four times a day (QID) | RESPIRATORY_TRACT | Status: DC | PRN

## 2017-08-24 MED ORDER — SODIUM CHLORIDE 0.9 % IV BOLUS
1000.0000 mL | Freq: Once | INTRAVENOUS | Status: AC
  Administered 2017-08-24: 1000 mL via INTRAVENOUS

## 2017-08-24 MED ORDER — ONDANSETRON HCL 4 MG/2ML IJ SOLN: 4.0000 mg | Freq: Four times a day (QID) | INTRAMUSCULAR | Status: DC | PRN

## 2017-08-24 MED ORDER — ONDANSETRON HCL 4 MG PO TABS: 4.0000 mg | ORAL_TABLET | Freq: Four times a day (QID) | ORAL | Status: DC | PRN

## 2017-08-24 MED ORDER — KCL IN DEXTROSE-NACL 20-5-0.45 MEQ/L-%-% IV SOLN
INTRAVENOUS | Status: DC
  Administered 2017-08-24 (×2): via INTRAVENOUS
  Filled 2017-08-24 (×2): qty 1000

## 2017-08-24 MED ORDER — MORPHINE SULFATE (PF) 4 MG/ML IV SOLN
4.0000 mg | Freq: Once | INTRAVENOUS | Status: AC
  Administered 2017-08-24: 4 mg via INTRAVENOUS
  Filled 2017-08-24: qty 1

## 2017-08-24 MED ORDER — DIATRIZOATE MEGLUMINE & SODIUM 66-10 % PO SOLN
90.0000 mL | Freq: Once | ORAL | Status: AC
  Administered 2017-08-24: 90 mL via NASOGASTRIC
  Filled 2017-08-24: qty 90

## 2017-08-24 MED ORDER — LORAZEPAM 2 MG/ML IJ SOLN: 0.5000 mg | Freq: Every evening | INTRAMUSCULAR | Status: DC | PRN

## 2017-08-24 MED ORDER — DICLOFENAC SODIUM 1 % TD GEL
2.0000 g | Freq: Four times a day (QID) | TRANSDERMAL | Status: DC
  Administered 2017-08-24 – 2017-08-25 (×2): 2 g via TOPICAL
  Filled 2017-08-24 (×2): qty 100

## 2017-08-24 MED ORDER — PHENOL 1.4 % MT LIQD
1.0000 | OROMUCOSAL | Status: DC | PRN
  Administered 2017-08-24 (×2): 1 via OROMUCOSAL
  Filled 2017-08-24: qty 177

## 2017-08-24 MED ORDER — ACETAMINOPHEN 650 MG RE SUPP: 650.0000 mg | Freq: Four times a day (QID) | RECTAL | Status: DC | PRN

## 2017-08-24 NOTE — ED Provider Notes (Signed)
[facility] EMERGENCY DEPARTMENT Provider Note   CSN: [id number] Arrival date & time: 08/24/17  0232     History   Chief Complaint Chief Complaint  Patient presents with  . Abdominal Pain    HPI [name] is a 77 y.o. female.  Patient presents to the ER for evaluation of abdominal pain with nausea and vomiting.  Patient reports that symptoms began yesterday.  She reports that for the last 12 hours she has had repeated vomiting.  She reports that sharp stabbing pain in the epigastric region that then causes her to vomit and then the pain goes away.  This has recurred multiple times.  Patient noted to be tachycardic by EMS.  She was administered Zofran with some improvement of her nausea.  She is not experiencing any chest pain or shortness of breath.  She reports cholecystectomy "years ago".     Past Medical History:  Diagnosis Date  . Anemia   . B12 deficiency   . Bacteremia 10/07/2014  . Bladder incontinence   . Blind left eye   . Cataract   . Cervical spine fracture ([place])   . Chest pain 07/29/2013  . Compression fracture    C7,  upper T spine -compression fx  . COPD (chronic obstructive pulmonary disease) ([place])   . DDD (degenerative disc disease), lumbar   . Depression    major  . Diffuse myofascial pain syndrome 03/24/2015  . Fever 10/05/2014  . GERD (gastroesophageal reflux disease)   . Hiatal hernia   . Hyperlipidemia   . Hypertension   . 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 Macular degeneration   . Osteoarthritis   . Osteoporosis   . Reactive airway disease   . Rupture of bowel ([place])   . Sepsis ([place]) 10/08/2014  . Stroke [facility])    "mini stroke"  . Stroke ([place])   . Wrist pain, acute 09/10/2012    Patient Active Problem List   Diagnosis Date Noted  . Abnormal x-ray of pelvis 08/13/2017  . Other specified dorsopathies, sacral and sacrococcygeal region 08/04/2017  . Spondylosis without myelopathy or radiculopathy,  lumbosacral region 07/17/2017  . Non-traumatic compression fracture of T3 thoracic vertebra, sequela 07/02/2017  . High risk medication use 07/02/2017  . At high risk for falls 07/02/2017  . Chronic sacroiliac joint pain (Right) 07/02/2017  . CKD (chronic kidney disease) stage 3, GFR 30-59 ml/min (HCC) 04/23/2017  . Chronic knee pain s/p total knee replacement (TKR) (Right) 04/02/2017  . DDD (degenerative disc disease), lumbar 03/05/2017  . Chronic knee pain (Bilateral) (R>L) 03/05/2017  . Osteoarthritis 03/05/2017  . Chronic musculoskeletal pain 03/05/2017  . Neurogenic pain 03/05/2017  . Chronic myofascial pain 02/12/2017  . Lumbar facet syndrome (Bilateral) (L>R) 02/12/2017  . Long term prescription benzodiazepine use 02/12/2017  . Opiate use 02/12/2017  . Disorder of skeletal system 02/12/2017  . Pharmacologic therapy 02/12/2017  . Problems influencing health status 02/12/2017  . 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 NSAID long-term use 02/12/2017  . DDD (degenerative disc disease), cervical 11/11/2016  . Lumbar facet arthropathy (Bilateral) 11/11/2016  . 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 CAFL (chronic airflow limitation) ([place]) 11/07/2016  . Depression, major, in partial remission ([place]) 11/07/2016  . 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 Bed wetting 11/07/2016  . Long term current use of opiate analgesic 11/07/2016  . Long term prescription opiate use 11/07/2016  . Chronic pain syndrome 11/07/2016  . Chronic neck  pain (Secondary Area of Pain) (Bilateral) (L>R) 11/07/2016  . Chronic hip pain (Right) 11/07/2016  . Rib pain 11/07/2016  . Age-related osteoporosis with current pathological fracture with routine healing 09/20/2016  . History of stroke 09/20/2016  . Major depression in remission ([place]) 09/20/2016  . Hip fracture ([place]) 09/13/2016  . Intertrochanteric fracture of femur, sequela (Right) 09/13/2016  .  Fall   . Chronic shoulder pain (Bilateral) 08/12/2016  . Hx of total knee replacement (Bilateral) 04/04/2016  . Chronic shoulder pain (Left) 02/28/2016  . Hoarseness 01/12/2016  . Pharyngoesophageal dysphagia 01/12/2016  . Chronic low back pain [facility] Area of Pain) (Bilateral) (L>R) 10/26/2015  . 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 UTI (urinary tract infection) 06/25/2015  . Syncope, non cardiac 05/22/2015  . Leukopenia 10/07/2014  . Thrombocytopenia ([place]) 10/07/2014  . Hypoxia 10/05/2014  . Anxiety 06/15/2014  . Apoplectic vertigo 06/15/2014  . Duodenogastric reflux 06/15/2014  . Benign essential HTN 06/15/2014  . Infection of the upper respiratory tract 06/15/2014  . Hypercholesteremia 07/30/2013  . HTN (hypertension) 07/30/2013  . GERD (gastroesophageal reflux disease) 07/30/2013  . Dizziness and giddiness 06/18/2013  . Memory loss 05/03/2013  . Chronic knee pain (Primary Area of Pain) (Right) 09/10/2012    Past Surgical History:  Procedure Laterality Date  . ABDOMINAL HYSTERECTOMY    . ABDOMINAL SURGERY    . APPENDECTOMY    . BLADDER SURGERY    . CHOLECYSTECTOMY    . COLON SURGERY    . EYE SURGERY     cataracts  . HIP SURGERY Right 08/2016  . INTRAMEDULLARY (IM) NAIL INTERTROCHANTERIC Right 09/13/2016   Procedure: INTRAMEDULLARY (IM) NAIL INTERTROCHANTRIC RIGHT;  Surgeon: Leandrew Koyanagi, MD;  Location: [facility];  Service: Orthopedics;  Laterality: Right;  . JOINT REPLACEMENT    . KNEE SURGERY    . OSTOMY    . RECTOCELE REPAIR       OB History    Gravida  0   Para  0   Term  0   Preterm  0   AB  0   Living        SAB  0   TAB  0   Ectopic  0   Multiple      Live Births               Home Medications    Prior to Admission medications   Medication Sig Start Date End Date Taking? Authorizing Provider  albuterol (PROVENTIL HFA;VENTOLIN HFA) 108 (90 BASE) MCG/ACT  inhaler Inhale 2 puffs into the lungs every 6 (six) hours as needed for wheezing or shortness of breath.     [provider]  albuterol (PROVENTIL) (2.5 MG/3ML) 0.083% nebulizer solution Take 2.5 mg by nebulization every 6 (six) hours as needed for wheezing or shortness of breath.    [provider]  alendronate (FOSAMAX) 70 MG tablet Take 70 mg by mouth every Monday. Take with a full glass of water on an empty stomach.    [provider]  ALPRAZolam Duanne Moron) 1 MG tablet Take 1 mg by mouth at bedtime.  02/03/17   [provider]  amLODipine (NORVASC) 5 MG tablet Take 5 mg by mouth daily.  04/06/13   [provider]  baclofen (LIORESAL) 10 MG tablet Take 1 tablet (10 mg total) by mouth daily. Patient not taking: Reported on 07/15/2017 03/05/17 07/15/17  Milinda Pointer, MD  CALCIUM  PO Take 1 tablet by mouth 2 (two) times daily.    [provider]  citalopram (CELEXA) 20 MG tablet Take 30 mg by mouth daily.  02/27/17   [provider]  diclofenac sodium (VOLTAREN) 1 % GEL Apply 2 g topically 4 (four) times daily. 08/12/17 11/10/17  Vevelyn Francois, NP  docusate sodium (COLACE) 100 MG capsule Take 300 mg by mouth daily as needed for mild constipation.     [provider]  esomeprazole (NEXIUM) 40 MG capsule Take 40 mg by mouth 2 (two) times daily before a meal.     [provider]  ferrous gluconate (IRON 27) 240 (27 FE) MG tablet Take 240 mg by mouth 3 (three) times a week.    [provider]  gabapentin (NEURONTIN) 400 MG capsule Take 1 capsule (400 mg total) by mouth at bedtime. 08/12/17 11/10/17  Vevelyn Francois, NP  GINKGO BILOBA PO Take 1 capsule by mouth 3 (three) times a week.    [provider]  HYDROcodone-acetaminophen (NORCO/VICODIN) 5-325 MG tablet Take 1 tablet by mouth every 8 (eight) hours. 08/12/17 09/11/17  Vevelyn Francois, NP  Krill Oil 350 MG CAPS Take 350 mg by mouth daily.    [provider]  lactose free nutrition (BOOST) LIQD Take 237 mLs by mouth daily.    [provider]  linaclotide (LINZESS) 290 MCG CAPS capsule Take 290 mcg by mouth daily.    [provider]  lovastatin (MEVACOR) 10 MG tablet Take 10 mg by mouth daily. 09/22/14   [provider]  meloxicam (MOBIC) 15 MG tablet Take 15 mg by mouth daily.  05/23/16   [provider]  Menthol, Topical Analgesic, (BIOFREEZE EX) Apply 1 application topically daily as needed (muscle pain).    [provider]  Multiple Vitamins-Minerals (MULTIVITAMIN GUMMIES ADULT PO) Take 1 tablet by mouth daily.    [provider]  ondansetron (ZOFRAN) 8 MG tablet Take 8 mg by mouth every 8 (eight) hours as needed for nausea or vomiting.    [provider]  orphenadrine (NORFLEX) 100 MG tablet Take 1 tablet (100 mg total) by mouth 2 (two) times daily. 04/24/17   Vevelyn Francois, NP  polyethylene glycol (MIRALAX / GLYCOLAX) packet Take 17 g by mouth daily.    [provider]  promethazine (PHENERGAN) 12.5 MG suppository Place 12.5 mg rectally as needed for nausea or vomiting.    [provider]  senna (SENOKOT) 8.6 MG TABS tablet Take 3 tablets by mouth daily as needed for mild constipation. constipation     [provider]  traZODone (DESYREL) 100 MG tablet Take 100 mg by mouth at bedtime.     [provider]  vitamin B-12 (CYANOCOBALAMIN) 500 MCG tablet Take 500 mcg by mouth daily.    [provider]    Family History Family History  Problem Relation Age of Onset  . Heart failure Mother   . Heart attack Father     Social History Social History   Tobacco Use  . Smoking status: Former Smoker    Packs/day: 1.50    Years: 30.00    Pack years: 45.00  . Smokeless tobacco: Current User  . Tobacco comment: quit 2013  Substance Use Topics  . Alcohol use: No    Alcohol/week: 0.0 oz  . Drug use: No     Allergies     Ampicillin; Ampicillin-sulbactam sodium; Toradol [ketorolac tromethamine]; Tramadol; Flexeril [cyclobenzaprine]; Ambien [zolpidem tartrate];  Cephalexin; Percocet [oxycodone-acetaminophen]; Sulfamethoxazole-trimethoprim; and Tape   Review of Systems Review of Systems  Gastrointestinal: Positive for abdominal pain, nausea and vomiting.  All other systems reviewed and are negative.    Physical Exam Updated Vital Signs BP (!) 141/98   Pulse (!) 102   Temp 98.4 F (36.9 C) (Oral)   Resp 20   Ht 5\' 5"  (1.651 m)   Wt 83.9 kg (185 lb)   SpO2 90%   BMI 30.79 kg/m   Physical Exam  Constitutional: She is oriented to person, place, and time. She appears well-developed and well-nourished. No distress.  HENT:  Head: Normocephalic and atraumatic.  Right Ear: Hearing normal.  Left Ear: Hearing normal.  Nose: Nose normal.  Mouth/Throat: Oropharynx is clear and moist and mucous membranes are normal.  Eyes: Pupils are equal, round, and reactive to light. Conjunctivae and EOM are normal.  Neck: Normal range of motion. Neck supple.  Cardiovascular: Regular rhythm, S1 normal and S2 normal. Exam reveals no gallop and no friction rub.  No murmur heard. Pulmonary/Chest: Effort normal and breath sounds normal. No respiratory distress. She exhibits no tenderness.  Abdominal: Soft. Normal appearance and bowel sounds are normal. There is no hepatosplenomegaly. There is tenderness in the epigastric area. There is no rebound, no guarding, no tenderness at McBurney's point and negative Murphy's sign. No hernia.  Musculoskeletal: Normal range of motion.  Neurological: She is alert and oriented to person, place, and time. She has normal strength. No cranial nerve deficit or sensory deficit. Coordination normal. GCS eye subscore is 4. GCS verbal subscore is 5. GCS motor subscore is 6.  Skin: Skin is warm, dry and intact. No rash noted. No cyanosis.  Psychiatric: She has a normal mood and affect. Her speech is  normal and behavior is normal. Thought content normal.  Nursing note and vitals reviewed.    ED Treatments / Results  Labs (all labs ordered are listed, but only abnormal results are displayed) Labs Reviewed  CBC WITH DIFFERENTIAL/PLATELET - Abnormal; Notable for the following components:      Result Value   Hemoglobin 15.3 (*)    HCT 46.7 (*)    All other components within normal limits  COMPREHENSIVE METABOLIC PANEL - Abnormal; Notable for the following components:   Glucose, Bld 111 (*)    BUN 5 (*)    Creatinine, Ser 1.08 (*)    AST 45 (*)    GFR calc non Af Amer 49 (*)    GFR calc Af Amer 56 (*)    All other components within normal limits  LIPASE, BLOOD  URINALYSIS, ROUTINE W REFLEX MICROSCOPIC  I-STAT TROPONIN, ED    EKG EKG Interpretation  Date/Time:  Sunday August 24 2017 02:52:58 EDT Ventricular Rate:  118 PR Interval:    QRS Duration: 85 QT Interval:  307 QTC Calculation: 431 R Axis:   21 Text Interpretation:  Sinus tachycardia Inferior infarct, age indeterminate No significant change since last tracing Confirmed by Orpah Greek [phone]) on 08/24/2017 3:02:52 AM   Radiology No results found.  Procedures Procedures (including critical care time)  Medications Ordered in ED Medications  sodium chloride 0.9 % bolus 1,000 mL (0 mLs Intravenous Stopped 08/24/17 0418)  morphine 4 MG/ML injection 4 mg (4 mg Intravenous Given 08/24/17 0413)     Initial Impression / Assessment and Plan / ED Course  I have reviewed the triage vital signs and the nursing notes.  Pertinent labs & imaging results that were available during  my care of the patient were reviewed by me and considered in my medical decision making (see chart for details).     Patient presents to the emergency department for evaluation of abdominal pain.  Patient had diffuse tenderness across her upper abdomen at arrival.  Patient has had previous cholecystectomy.  She has associated nausea and  vomiting.  There is no chest pain.  EKG does not suggest cardiac etiology.  Troponin negative.  Symptoms have been ongoing for more than 12 hours.  This does not appear to be cardiac in nature.  Her lab work is unremarkable.  No leukocytosis.  Normal LFTs, renal function.  Patient administered IV fluids.  She did have an initial tachycardia that is improving.  She likely had some element of dehydration.  She was administered analgesia.  She appeared to be more comfortable upon reevaluation, but still has diffuse tenderness in her abdomen.  She will therefore undergo CT scan.  Will sign out to oncoming ER physician to disposition after CT scan.  Final Clinical Impressions(s) / ED Diagnoses   Final diagnoses:  Pain of upper abdomen    ED Discharge Orders    None       Orpah Greek, MD 08/24/17 [phone]

## 2017-08-24 NOTE — H&P (Signed)
History and Physical:    [name]   JWJ:[id number] DOB: [birth year] DOA: 08/24/2017  Referring MD/provider: Particia Nearing PCP: Wenda Low, MD   Patient coming from: Home  Chief Complaint: abdominal pain nausea and vomiting  History of Present Illness:   [name] is an 77 y.o. female past medical history significant for hypertension and chronic pain secondary to multiple orthopedic surgeries, chronic constipation who was in her usual state of reasonable health untillast night when she had sudden onset of abdominal pain associated with nausea and vomiting. Patient states that she has vomited multiple times since yesterday and does have some improvement of her pain after each episode of vomitus. She denies any blood in her vomitus. Notes that it's mostly green and brown colored. Patient denies any chest pain or shortness of breath. Patient denies fevers or chills. Denies any diarrhea. Patient admits to continued constipation however did have a bowel movement yesterday. Patient does admit to history of abdominal surgeries in the past. She notes that 13 years ago they "cut my bowel accidentally and they had to take me back to the OR". Patient denies history of bowel obstructions in the past.  ED Course:  The patient was treated with IV hydration and Zofran with no significant improvement in her vomiting and abdominal pain. CT scan revealed small bowel obstruction and patient is referred for admission. Gen. Surgery would be following with [place]  ROS:     Review of Systems:  No fevers or chills. No cough. No new shortness of breath. No hemoptysis. No dysuria. No dizziness or syncope. She has continued back and knee and joint pain.   Past Medical History:   Past Medical History:  Diagnosis Date  . Anemia   . B12 deficiency   . Bacteremia 10/07/2014  . Bladder incontinence   . Blind left eye   . Cataract   . Cervical spine fracture ([place])   . Chest pain 07/29/2013  .  Compression fracture    C7,  upper T spine -compression fx  . COPD (chronic obstructive pulmonary disease) ([place])   . DDD (degenerative disc disease), lumbar   . Depression    major  . Diffuse myofascial pain syndrome 03/24/2015  . Fever 10/05/2014  . GERD (gastroesophageal reflux disease)   . Hiatal hernia   . Hyperlipidemia   . Hypertension   . 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 Macular degeneration   . Osteoarthritis   . Osteoporosis   . Reactive airway disease   . Rupture of bowel ([place])   . Sepsis ([place]) 10/08/2014  . Stroke [facility])    "mini stroke"  . Stroke ([place])   . Wrist pain, acute 09/10/2012    Past Surgical History:   Past Surgical History:  Procedure Laterality Date  . ABDOMINAL HYSTERECTOMY    . ABDOMINAL SURGERY    . APPENDECTOMY    . BLADDER SURGERY    . CHOLECYSTECTOMY    . COLON SURGERY    . EYE SURGERY     cataracts  . HIP SURGERY Right 08/2016  . INTRAMEDULLARY (IM) NAIL INTERTROCHANTERIC Right 09/13/2016   Procedure: INTRAMEDULLARY (IM) NAIL INTERTROCHANTRIC RIGHT;  Surgeon: Leandrew Koyanagi, MD;  Location: [facility];  Service: Orthopedics;  Laterality: Right;  . JOINT REPLACEMENT    . KNEE SURGERY    . OSTOMY    . RECTOCELE REPAIR      Social History:   Social History   Socioeconomic History  . Marital status:  Widowed    Spouse name: Not on file  . Number of children: 3  . Years of education: middle sch  . Highest education level: Not on file  Occupational History  . Occupation: retired    Comment: n/a  Social Needs  . Financial resource strain: Not on file  . Food insecurity:    Worry: Not on file    Inability: Not on file  . Transportation needs:    Medical: Not on file    Non-medical: Not on file  Tobacco Use  . Smoking status: Former Smoker    Packs/day: 1.50    Years: 30.00    Pack years: 45.00  . Smokeless tobacco: Current User  . Tobacco comment: quit 2013  Substance and Sexual Activity  . Alcohol use: No     Alcohol/week: 0.0 oz  . Drug use: No  . Sexual activity: Never  Lifestyle  . Physical activity:    Days per week: Not on file    Minutes per session: Not on file  . Stress: Not on file  Relationships  . Social connections:    Talks on phone: Not on file    Gets together: Not on file    Attends religious service: Not on file    Active member of club or organization: Not on file    Attends meetings of clubs or organizations: Not on file    Relationship status: Not on file  . Intimate partner violence:    Fear of current or ex partner: Not on file    Emotionally abused: Not on file    Physically abused: Not on file    Forced sexual activity: Not on file  Other Topics Concern  . Not on file  Social History Narrative   ** Merged History Encounter **       Patient lives at home with daughter. Caffeine Use: 4 cups daily    Allergies   Ampicillin; Ampicillin-sulbactam sodium; Toradol [ketorolac tromethamine]; Tramadol; Flexeril [cyclobenzaprine]; Ambien [zolpidem tartrate]; Cephalexin; Percocet [oxycodone-acetaminophen]; Sulfamethoxazole-trimethoprim; and Tape  Family history:   Family History  Problem Relation Age of Onset  . Heart failure Mother   . Heart attack Father     Current Medications:   Prior to Admission medications   Medication Sig Start Date End Date Taking? Authorizing Provider  albuterol (PROVENTIL HFA;VENTOLIN HFA) 108 (90 BASE) MCG/ACT inhaler Inhale 2 puffs into the lungs every 6 (six) hours as needed for wheezing or shortness of breath.     [provider]  albuterol (PROVENTIL) (2.5 MG/3ML) 0.083% nebulizer solution Take 2.5 mg by nebulization every 6 (six) hours as needed for wheezing or shortness of breath.    [provider]  alendronate (FOSAMAX) 70 MG tablet Take 70 mg by mouth every Monday. Take with a full glass of water on an empty stomach.    [provider]  ALPRAZolam Duanne Moron) 1 MG tablet Take 1 mg by mouth at bedtime.   02/03/17   [provider]  amLODipine (NORVASC) 5 MG tablet Take 5 mg by mouth daily.  04/06/13   [provider]  baclofen (LIORESAL) 10 MG tablet Take 1 tablet (10 mg total) by mouth daily. Patient not taking: Reported on 07/15/2017 03/05/17 07/15/17  Milinda Pointer, MD  CALCIUM PO Take 1 tablet by mouth 2 (two) times daily.    [provider]  citalopram (CELEXA) 20 MG tablet Take 30 mg by mouth daily.  02/27/17   [provider]  diclofenac sodium (  VOLTAREN) 1 % GEL Apply 2 g topically 4 (four) times daily. 08/12/17 11/10/17  Vevelyn Francois, NP  docusate sodium (COLACE) 100 MG capsule Take 300 mg by mouth daily as needed for mild constipation.     [provider]  esomeprazole (NEXIUM) 40 MG capsule Take 40 mg by mouth 2 (two) times daily before a meal.     [provider]  ferrous gluconate (IRON 27) 240 (27 FE) MG tablet Take 240 mg by mouth 3 (three) times a week.    [provider]  gabapentin (NEURONTIN) 400 MG capsule Take 1 capsule (400 mg total) by mouth at bedtime. 08/12/17 11/10/17  Vevelyn Francois, NP  GINKGO BILOBA PO Take 1 capsule by mouth 3 (three) times a week.    [provider]  HYDROcodone-acetaminophen (NORCO/VICODIN) 5-325 MG tablet Take 1 tablet by mouth every 8 (eight) hours. 08/12/17 09/11/17  Vevelyn Francois, NP  Krill Oil 350 MG CAPS Take 350 mg by mouth daily.    [provider]  lactose free nutrition (BOOST) LIQD Take 237 mLs by mouth daily.    [provider]  linaclotide (LINZESS) 290 MCG CAPS capsule Take 290 mcg by mouth daily.    [provider]  lovastatin (MEVACOR) 10 MG tablet Take 10 mg by mouth daily. 09/22/14   [provider]  meloxicam (MOBIC) 15 MG tablet Take 15 mg by mouth daily.  05/23/16   [provider]  Menthol, Topical Analgesic, (BIOFREEZE EX) Apply 1 application topically daily as needed (muscle pain).    [provider]    Multiple Vitamins-Minerals (MULTIVITAMIN GUMMIES ADULT PO) Take 1 tablet by mouth daily.    [provider]  ondansetron (ZOFRAN) 8 MG tablet Take 8 mg by mouth every 8 (eight) hours as needed for nausea or vomiting.    [provider]  orphenadrine (NORFLEX) 100 MG tablet Take 1 tablet (100 mg total) by mouth 2 (two) times daily. 04/24/17   Vevelyn Francois, NP  polyethylene glycol (MIRALAX / GLYCOLAX) packet Take 17 g by mouth daily.    [provider]  promethazine (PHENERGAN) 12.5 MG suppository Place 12.5 mg rectally as needed for nausea or vomiting.    [provider]  senna (SENOKOT) 8.6 MG TABS tablet Take 3 tablets by mouth daily as needed for mild constipation. constipation     [provider]  traZODone (DESYREL) 100 MG tablet Take 100 mg by mouth at bedtime.     [provider]  vitamin B-12 (CYANOCOBALAMIN) 500 MCG tablet Take 500 mcg by mouth daily.    [provider]    Physical Exam:   Vitals:   08/24/17 0745 08/24/17 1000 08/24/17 1015 08/24/17 1104  BP:  (!) 164/98 (!) 157/98 (!) 132/95  Pulse:  93 94 (!) 107  Resp:  16  16  Temp:    98.3 F (36.8 C)  TempSrc:    Oral  SpO2: 95% 98% (!) 89% 94%  Weight:    85.6 kg (188 lb 11.4 oz)  Height:    5\' 5"  (1.651 m)     Physical Exam: Blood pressure (!) 132/95, pulse (!) 107, temperature 98.3 F (36.8 C), temperature source Oral, resp. rate 16, height 5\' 5"  (1.651 m), weight 85.6 kg (188 lb 11.4 oz), SpO2 94 %. Gen: patient appearing stated age lying in bed with NG tube in place in no distress. NG tube is draining some bilious material. Eyes: Sclerae anicteric. Conjunctiva  mildly injected. Chest: Moderately good air entry bilaterally with no adventitious sounds.  CV: Distant, regular, no audible murmurs. Abdomen: patient does not have active bowel sounds. Her belly is distended but not hard. She does have tenderness to light palpation diffusely mostly in her  right upper quadrant and right lower quadrant area. Murphy's negative, McBurney's point negative Extremities: No edema.  Skin: Warm and dry. No rashes, lesions or wounds. Neuro: Alert and oriented times 3; grossly nonfocal. Psych: Patient is cooperative, logical and coherent with appropriate mood and affect.  Data Review:    Labs: Basic Metabolic Panel: Recent Labs  Lab 08/24/17 0308 08/24/17 1212  NA 143  --   K 3.5  --   CL 108  --   CO2 24  --   GLUCOSE 111*  --   BUN 5*  --   CREATININE 1.08* 0.88  CALCIUM 9.9  --    Liver Function Tests: Recent Labs  Lab 08/24/17 0308  AST 45*  ALT 44  ALKPHOS 105  BILITOT 0.7  PROT 7.2  ALBUMIN 3.9   Recent Labs  Lab 08/24/17 0308  LIPASE 22   No results for input(s): AMMONIA in the last 168 hours. CBC: Recent Labs  Lab 08/24/17 0308 08/24/17 1212  WBC 8.1 6.2  NEUTROABS 5.4  --   HGB 15.3* 13.9  HCT 46.7* 44.1  MCV 95.3 96.1  PLT 214 199   Cardiac Enzymes: No results for input(s): CKTOTAL, CKMB, CKMBINDEX, TROPONINI in the last 168 hours.  BNP (last 3 results) No results for input(s): PROBNP in the last 8760 hours. CBG: No results for input(s): GLUCAP in the last 168 hours.  Urinalysis    Component Value Date/Time   COLORURINE YELLOW 10/23/2016 0745   APPEARANCEUR CLOUDY (A) 10/23/2016 0745   LABSPEC 1.011 10/23/2016 0745   PHURINE 8.0 10/23/2016 0745   GLUCOSEU NEGATIVE 10/23/2016 0745   HGBUR NEGATIVE 10/23/2016 0745   BILIRUBINUR NEGATIVE 10/23/2016 0745   KETONESUR NEGATIVE 10/23/2016 0745   PROTEINUR NEGATIVE 10/23/2016 0745   UROBILINOGEN 0.2 02/20/2015 0605   NITRITE NEGATIVE 10/23/2016 0745   LEUKOCYTESUR SMALL (A) 10/23/2016 0745      Radiographic Studies: Ct Abdomen Pelvis W Contrast  Result Date: 08/24/2017 CLINICAL DATA:  Abdominal pain, acute, generalized. Nausea and vomiting that began yesterday. EXAM: CT ABDOMEN AND PELVIS WITH CONTRAST TECHNIQUE: Multidetector CT imaging of the  abdomen and pelvis was performed using the standard protocol following bolus administration of intravenous contrast. CONTRAST:  8mL ISOVUE-300 IOPAMIDOL (ISOVUE-300) INJECTION 61% COMPARISON:  CT stone study 10/23/2016 FINDINGS: Lower chest: Cardiomegaly. Mild atelectatic type change. No acute finding. Hepatobiliary: Cholecystectomy with common bile duct dilatation. The common bile duct measures up to 13 mm at the pancreatic head, chronic when compared to prior. No calcified choledocholithiasis.Negative liver. Pancreas: Generalized atrophy. Spleen: Granulomatous type calcifications Adrenals/Urinary Tract: Negative adrenals. Symmetric renal atrophy with bilateral cortical cystic densities. 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 No hydronephrosis. Unremarkable bladder. Stomach/Bowel: There are distended fluid-filled loops of small bowel with transition to decompressed ileum. Near the transition is a loop of small bowel containing fecalized contents. These are findings of early/partial small bowel obstruction. No evidence of bowel necrosis perforation. No visible underlying mass. Colon is primarily decompressed. Sigmoid diverticulosis. No pericecal inflammation. Patient has history of appendectomy. Vascular/Lymphatic: Atherosclerotic calcification. No acute vascular finding. No mass or adenopathy. Reproductive:Hysterectomy Other: No ascites or pneumoperitoneum. Musculoskeletal: There is a band of sclerosis  and cortical irregularity across the right sacral ala, sacral insufficiency fracture with subacute features. Intertrochanteric right femur fracture status post fixation. Right inferior pubic ramus and right puboacetabular junction fractures with band of sclerosis. IMPRESSION: 1. Partial/early small bowel obstruction, likely from adhesive change. 2. Subacute right sacral and obturator ring insufficiency fractures. History of fall and right-sided pain early February  2019. 3.  Aortic Atherosclerosis (ICD10-I70.0). 4. 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 Electronically Signed   By: Monte Fantasia M.D.   On: 08/24/2017 08:51   Dg Abd Portable 1v  Result Date: 08/24/2017 CLINICAL DATA:  Nasogastric tube placement. EXAM: PORTABLE ABDOMEN - 1 VIEW COMPARISON:  08/24/2017 CT FINDINGS: Nasogastric tube terminates at the proximal stomach with side port in the region of the gastroesophageal junction. Contrast within right renal collecting system. No free intraperitoneal air or significant bowel dilatation identified. IMPRESSION: Nasogastric tube terminating in the proximal stomach with side port in the region of the gastroesophageal junction. Consider advancement. Electronically Signed   By: Abigail Miyamoto M.D.   On: 08/24/2017 12:16    EKG: Independently reviewed. Sinus tach at 118. Q in 3, V1, V3 with poor R-wave progression. No acute ST-T wave changes.   Assessment/Plan:   Principal Problem:   SBO (small bowel obstruction) (HCC) Active Problems:   HTN (hypertension)   GERD (gastroesophageal reflux disease)   Chronic pain syndrome   CKD (chronic kidney disease) stage 3, GFR 30-59 ml/min (HCC)  SBO Patient with small bowel obstruction likely secondary to adhesions. Will treat conservatively with NG tube decompression, bowel rest and IV fluid support. Follow electrolytes closely. General surgery is following with [place].   HTN Hold amlodipine 5mg  for now BP is wnl likely secondary to intravascular volume depletion, so will not start any antihypertensive right now.  CHRONIC PAIN Holding vicodin 1 tab q 8h,  Gabapentin, meloxicam  Continue biofreeze and volaren gel Will provide low dose fentanyl prn--patient may have had difficulty managing opiods in the past   GERD Changed esomprazole to iv pantoprazole   INSOMNIA Patient on prn xanax and trazodone at bedtime on home meds Will change to ativan 0.5 hs prn   CHRONIC  CONSTIPATION Holding linzess, prn dulcolax suppos or enemas if required  DEPRESSION Hold celexa   HL Holding mevacor    Other information:   DVT prophylaxis: Lovenox ordered. Code Status: Full code. Family Communication: patient states her family is aware that she is here  Disposition Plan: Home Consults called: Gen. surgery Admission status: Inpatient   The medical decision making on this patient was of high complexity and the patient is at high risk for clinical deterioration, therefore this is a level 3 visit.  Dewaine Oats Derek Jack [facility] Pager [phone] Cell: [phone]   If 7PM-7AM, please contact night-coverage [email or web address] Password [facility] 08/24/2017, 12:55 PM

## 2017-08-24 NOTE — ED Triage Notes (Signed)
Pt bib [facility] from home d/t epigastric pain that started at approximately 1600 yesterday.  +N/V. 4 mg zofran en route.  Per EMS 12 lead unremarkable and pt did not vomit en route.

## 2017-08-24 NOTE — ED Provider Notes (Addendum)
Complains of abdominal pain and multiple episodes of vomiting since yesterday..  She reports that her last abdominal surgery was in her 42s when she was in [place].  On exam patient is actively dry heaving.  Appears mildly uncomfortable.  NG tube ordered  Consulted general surgery who will see patient in consultation.  Surgery request admission to medical service.  I consulted Dr.Chatterjee from hospitalist service who will arrange for admission   Results for orders placed or performed during the hospital encounter of 08/24/17  CBC with Differential/Platelet  Result Value Ref Range   WBC 8.1 4.0 - 10.5 K/uL   RBC 4.90 3.87 - 5.11 MIL/uL   Hemoglobin 15.3 (H) 12.0 - 15.0 g/dL   HCT 46.7 (H) 36.0 - 46.0 %   MCV 95.3 78.0 - 100.0 fL   MCH 31.2 26.0 - 34.0 pg   MCHC 32.8 30.0 - 36.0 g/dL   RDW 15.0 11.5 - 15.5 %   Platelets 214 150 - 400 K/uL   Neutrophils Relative % 66 %   Neutro Abs 5.4 1.7 - 7.7 K/uL   Lymphocytes Relative 24 %   Lymphs Abs 1.9 0.7 - 4.0 K/uL   Monocytes Relative 9 %   Monocytes Absolute 0.7 0.1 - 1.0 K/uL   Eosinophils Relative 1 %   Eosinophils Absolute 0.1 0.0 - 0.7 K/uL   Basophils Relative 0 %   Basophils Absolute 0.0 0.0 - 0.1 K/uL  Comprehensive metabolic panel  Result Value Ref Range   Sodium 143 135 - 145 mmol/L   Potassium 3.5 3.5 - 5.1 mmol/L   Chloride 108 101 - 111 mmol/L   CO2 24 22 - 32 mmol/L   Glucose, Bld 111 (H) 65 - 99 mg/dL   BUN 5 (L) 6 - 20 mg/dL   Creatinine, Ser 1.08 (H) 0.44 - 1.00 mg/dL   Calcium 9.9 8.9 - 10.3 mg/dL   Total Protein 7.2 6.5 - 8.1 g/dL   Albumin 3.9 3.5 - 5.0 g/dL   AST 45 (H) 15 - 41 U/L   ALT 44 14 - 54 U/L   Alkaline Phosphatase 105 38 - 126 U/L   Total Bilirubin 0.7 0.3 - 1.2 mg/dL   GFR calc non Af Amer 49 (L) >60 mL/min   GFR calc Af Amer 56 (L) >60 mL/min   Anion gap 11 5 - 15  Lipase, blood  Result Value Ref Range   Lipase 22 11 - 51 U/L  I-stat troponin, ED  Result Value Ref Range   Troponin i, poc  0.01 0.00 - 0.08 ng/mL   Comment 3           Ct Abdomen Pelvis W Contrast  Result Date: 08/24/2017 CLINICAL DATA:  Abdominal pain, acute, generalized. Nausea and vomiting that began yesterday. EXAM: CT ABDOMEN AND PELVIS WITH CONTRAST TECHNIQUE: Multidetector CT imaging of the abdomen and pelvis was performed using the standard protocol following bolus administration of intravenous contrast. CONTRAST:  57mL ISOVUE-300 IOPAMIDOL (ISOVUE-300) INJECTION 61% COMPARISON:  CT stone study 10/23/2016 FINDINGS: Lower chest: Cardiomegaly. Mild atelectatic type change. No acute finding. Hepatobiliary: Cholecystectomy with common bile duct dilatation. The common bile duct measures up to 13 mm at the pancreatic head, chronic when compared to prior. No calcified choledocholithiasis.Negative liver. Pancreas: Generalized atrophy. Spleen: Granulomatous type calcifications Adrenals/Urinary Tract: Negative adrenals. Symmetric renal atrophy with bilateral cortical cystic densities. 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 No hydronephrosis. Unremarkable bladder. Stomach/Bowel: There are  distended fluid-filled loops of small bowel with transition to decompressed ileum. Near the transition is a loop of small bowel containing fecalized contents. These are findings of early/partial small bowel obstruction. No evidence of bowel necrosis perforation. No visible underlying mass. Colon is primarily decompressed. Sigmoid diverticulosis. No pericecal inflammation. Patient has history of appendectomy. Vascular/Lymphatic: Atherosclerotic calcification. No acute vascular finding. No mass or adenopathy. Reproductive:Hysterectomy Other: No ascites or pneumoperitoneum. Musculoskeletal: There is a band of sclerosis and cortical irregularity across the right sacral ala, sacral insufficiency fracture with subacute features. Intertrochanteric right femur fracture status post fixation. Right inferior  pubic ramus and right puboacetabular junction fractures with band of sclerosis. IMPRESSION: 1. Partial/early small bowel obstruction, likely from adhesive change. 2. Subacute right sacral and obturator ring insufficiency fractures. History of fall and right-sided pain early February 2019. 3.  Aortic Atherosclerosis (ICD10-I70.0). 4. 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 Electronically Signed   By: Monte Fantasia M.D.   On: 08/24/2017 08:51   Dg Hip Unilat W Or W/o Pelvis 2-3 Views Right  Result Date: 08/12/2017 CLINICAL DATA:  Chronic right hip pain, surgery on the right hip 1 year ago, fell 1 week ago EXAM: DG HIP (WITH OR WITHOUT PELVIS) 2-3V RIGHT COMPARISON:  Pelvis and right hip films of 06/25/2017 FINDINGS: Intramedullary rod is present for fixation of prior intertrochanteric femoral fracture. The fracture line through the intertrochanteric femur is less evident, consistent with some interval healing. No acute fracture is seen. The bones are diffusely osteopenic. The pelvic rami are intact. There is some sclerosis however noted through the right sacrum and healing fracture would be difficult to exclude. CT of the pelvis may be helpful if further assessment is warranted clinically. IMPRESSION: 1. Cannot exclude subtle fracture through the right sacrum. Consider CT of the pelvis to assess further. 2. No acute fracture of the right hip after pinning. Healing fracture of the right intertrochanteric femur. 3. Osteopenia. Electronically Signed   By: Ivar Drape M.D.   On: 08/12/2017 13:35  Vomiting and abdominal pain consistent with small bowel obstruction, corroborated by CT scan Orlie Dakin, MD 08/24/17 [place], [place], MD 08/24/17 [phone]

## 2017-08-24 NOTE — ED Notes (Signed)
Patient attempted to get urine specimen.  Contaminated due to bowel movement.

## 2017-08-24 NOTE — Consult Note (Signed)
Reason for Consult: Small bowel obstruction Referring Physician: Dr. Cathleen Fears ED CC:   epigastric pain   [name] is an 77 y.o. female.    HPI: Patient is a 77 year old woman who presents to the ED with complaints of epigastric pain that started yesterday.  Nausea and vomiting.  She reports her last abdominal surgery was in [place] when she was around 77 years old.  Patient notes symptoms of abdominal distention and vomiting that started yesterday.  She has not had this problem before.  Does have problems with running ambulation and uses milk of magnesia and magnesium citrate.  Using these agents her last BM was on Saturday about 24 hours ago.  Work up shows she is afebrile with tachycardia and elevated BP.  CMP is essentially normal except for slightly elevated elevated glucose of 111, creatinine 1.08, AST of 45.  WBC 8.1 hemoglobin 15.3, hematocrit 46.7.  CT scan on admission shows cardiomegaly with mild atelectatic changes.  Prior cholecystectomy with common bile dilatation.  There are distended fluid-filled loops small bowel with transition to decompression the ileum near the transition is a loop of small bowel containing fecal lysed material.  No evidence of bowel necrosis or perforation.  No visible underlying masses.  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  An NG is just been placed and were all suctioned and is just being initiated.  Her entire abdomen is sore and tender but there is no focal changes.  No acute peritonitis.    Past Medical History:  Diagnosis Date  . Anemia   . B12 deficiency   . Bacteremia 10/07/2014  . Bladder incontinence   . Blind left eye   . Cataract   . Cervical spine fracture ([place])   . Chest pain 07/29/2013  . Compression fracture    C7,  upper T spine -compression fx  . COPD (chronic obstructive  pulmonary disease) ([place])   . DDD (degenerative disc disease), lumbar   . Depression    major  . Diffuse myofascial pain syndrome 03/24/2015  . Fever 10/05/2014  . GERD (gastroesophageal reflux disease)   . Hiatal hernia   . Hyperlipidemia   . Hypertension   . 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 Macular degeneration   . Osteoarthritis   . Osteoporosis   . Reactive airway disease   . Rupture of bowel ([place])   . Sepsis ([place]) 10/08/2014  . Stroke [facility])    "mini stroke"  . Stroke ([place])   . Wrist pain, acute 09/10/2012    Past Surgical History:  Procedure Laterality Date  . ABDOMINAL HYSTERECTOMY    . ABDOMINAL SURGERY    . APPENDECTOMY    . BLADDER SURGERY    . CHOLECYSTECTOMY    . COLON SURGERY    . EYE SURGERY     cataracts  . HIP SURGERY Right 08/2016  . INTRAMEDULLARY (IM) NAIL INTERTROCHANTERIC Right 09/13/2016   Procedure: INTRAMEDULLARY (IM) NAIL INTERTROCHANTRIC RIGHT;  Surgeon: Leandrew Koyanagi, MD;  Location: [facility];  Service: Orthopedics;  Laterality: Right;  . JOINT REPLACEMENT    . KNEE SURGERY    . OSTOMY    . RECTOCELE REPAIR       Family History  Problem Relation Age of Onset  . Heart failure Mother   . Heart attack Father     Social History:  reports that  she has quit smoking. She has a 45.00 pack-year smoking history. She uses smokeless tobacco. She reports that she does not drink alcohol or use drugs.  Allergies:  Allergies  Allergen Reactions  . Ampicillin Anaphylaxis, Nausea Only and Other (See Comments)    Thrush.  Muscles draw up tight - severe headache Angioedema Has patient had a PCN reaction causing immediate rash, facial/tongue/throat swelling, SOB or lightheadedness with hypotension: Yes Has patient had a PCN reaction causing severe rash involving mucus membranes or skin necrosis: No Has patient had a PCN reaction that required hospitalization:No Has patient had a PCN reaction occurring within the last 10 years: No If all of  the above answers are "NO", then may proceed with Cephalosporin use.  . Ampicillin-Sulbactam Sodium Anaphylaxis and Other (See Comments)    Muscles draw up tight - severe headache Angioedema Has patient had a PCN reaction causing immediate rash, facial/tongue/throat swelling, SOB or lightheadedness with hypotension: Yes Has patient had a PCN reaction causing severe rash involving mucus membranes or skin necrosis: No Has patient had a PCN reaction that required hospitalization:No Has patient had a PCN reaction occurring within the last 10 years: No If all of the above answers are "NO", then may proceed with Cephalosporin use.   . Toradol [Ketorolac Tromethamine] Nausea Only and Other (See Comments)    Headaches, back aches  . Tramadol Rash and Other (See Comments)    Headache.   Yvette Rack [Cyclobenzaprine] Other (See Comments)    Involuntary muscle jerks  . Ambien [Zolpidem Tartrate] Nausea And Vomiting and Other (See Comments)    Hallucinations/sweats  . Cephalexin Rash and Other (See Comments)    headaches  . Percocet [Oxycodone-Acetaminophen] Itching and Rash    Vicodin is OK, "itches but takes Benadryl"  . Sulfamethoxazole-Trimethoprim Other (See Comments)    unknown  . Tape Other (See Comments)    Paper tape only. Adhesive tape=itching/burning/rash   Prior to Admission medications   Medication Sig Start Date End Date Taking? Authorizing Provider  albuterol (PROVENTIL HFA;VENTOLIN HFA) 108 (90 BASE) MCG/ACT inhaler Inhale 2 puffs into the lungs every 6 (six) hours as needed for wheezing or shortness of breath.     [provider]  albuterol (PROVENTIL) (2.5 MG/3ML) 0.083% nebulizer solution Take 2.5 mg by nebulization every 6 (six) hours as needed for wheezing or shortness of breath.    [provider]  alendronate (FOSAMAX) 70 MG tablet Take 70 mg by mouth every Monday. Take with a full glass of water on an empty stomach.    [provider]  ALPRAZolam  Duanne Moron) 1 MG tablet Take 1 mg by mouth at bedtime.  02/03/17   [provider]  amLODipine (NORVASC) 5 MG tablet Take 5 mg by mouth daily.  04/06/13   [provider]  baclofen (LIORESAL) 10 MG tablet Take 1 tablet (10 mg total) by mouth daily. Patient not taking: Reported on 07/15/2017 03/05/17 07/15/17  Milinda Pointer, MD  CALCIUM PO Take 1 tablet by mouth 2 (two) times daily.    [provider]  citalopram (CELEXA) 20 MG tablet Take 30 mg by mouth daily.  02/27/17   [provider]  diclofenac sodium (VOLTAREN) 1 % GEL Apply 2 g topically 4 (four) times daily. 08/12/17 11/10/17  Vevelyn Francois, NP  docusate sodium (COLACE) 100 MG capsule Take 300 mg by mouth daily as needed for mild constipation.     [provider]  esomeprazole (NEXIUM) 40 MG capsule Take 40  mg by mouth 2 (two) times daily before a meal.     [provider]  ferrous gluconate (IRON 27) 240 (27 FE) MG tablet Take 240 mg by mouth 3 (three) times a week.    [provider]  gabapentin (NEURONTIN) 400 MG capsule Take 1 capsule (400 mg total) by mouth at bedtime. 08/12/17 11/10/17  Vevelyn Francois, NP  GINKGO BILOBA PO Take 1 capsule by mouth 3 (three) times a week.    [provider]  HYDROcodone-acetaminophen (NORCO/VICODIN) 5-325 MG tablet Take 1 tablet by mouth every 8 (eight) hours. 08/12/17 09/11/17  Vevelyn Francois, NP  Krill Oil 350 MG CAPS Take 350 mg by mouth daily.    [provider]  lactose free nutrition (BOOST) LIQD Take 237 mLs by mouth daily.    [provider]  linaclotide (LINZESS) 290 MCG CAPS capsule Take 290 mcg by mouth daily.    [provider]  lovastatin (MEVACOR) 10 MG tablet Take 10 mg by mouth daily. 09/22/14   [provider]  meloxicam (MOBIC) 15 MG tablet Take 15 mg by mouth daily.  05/23/16   [provider]  Menthol, Topical Analgesic, (BIOFREEZE EX) Apply 1 application topically daily as  needed (muscle pain).    [provider]  Multiple Vitamins-Minerals (MULTIVITAMIN GUMMIES ADULT PO) Take 1 tablet by mouth daily.    [provider]  ondansetron (ZOFRAN) 8 MG tablet Take 8 mg by mouth every 8 (eight) hours as needed for nausea or vomiting.    [provider]  orphenadrine (NORFLEX) 100 MG tablet Take 1 tablet (100 mg total) by mouth 2 (two) times daily. 04/24/17   Vevelyn Francois, NP  polyethylene glycol (MIRALAX / GLYCOLAX) packet Take 17 g by mouth daily.    [provider]  promethazine (PHENERGAN) 12.5 MG suppository Place 12.5 mg rectally as needed for nausea or vomiting.    [provider]  senna (SENOKOT) 8.6 MG TABS tablet Take 3 tablets by mouth daily as needed for mild constipation. constipation     [provider]  traZODone (DESYREL) 100 MG tablet Take 100 mg by mouth at bedtime.     [provider]  vitamin B-12 (CYANOCOBALAMIN) 500 MCG tablet Take 500 mcg by mouth daily.    [provider]     Results for orders placed or performed during the hospital encounter of 08/24/17 (from the past 48 hour(s))  CBC with Differential/Platelet     Status: Abnormal   Collection Time: 08/24/17  3:08 AM  Result Value Ref Range   WBC 8.1 4.0 - 10.5 K/uL   RBC 4.90 3.87 - 5.11 MIL/uL   Hemoglobin 15.3 (H) 12.0 - 15.0 g/dL   HCT 46.7 (H) 36.0 - 46.0 %   MCV 95.3 78.0 - 100.0 fL   MCH 31.2 26.0 - 34.0 pg   MCHC 32.8 30.0 - 36.0 g/dL   RDW 15.0 11.5 - 15.5 %   Platelets 214 150 - 400 K/uL   Neutrophils Relative % 66 %   Neutro Abs 5.4 1.7 - 7.7 K/uL   Lymphocytes Relative 24 %   Lymphs Abs 1.9 0.7 - 4.0 K/uL   Monocytes Relative 9 %   Monocytes Absolute 0.7 0.1 - 1.0 K/uL   Eosinophils Relative 1 %   Eosinophils Absolute 0.1 0.0 - 0.7 K/uL   Basophils Relative 0 %   Basophils Absolute 0.0 0.0 - 0.1 K/uL    Comment: Performed at [facility], [place] [address], [place], [place] [address]   Comprehensive metabolic panel     Status: Abnormal   Collection Time: 08/24/17  3:08 AM  Result Value Ref Range   Sodium 143 135 - 145 mmol/L   Potassium 3.5 3.5 - 5.1 mmol/L   Chloride 108 101 - 111 mmol/L   CO2 24 22 - 32 mmol/L   Glucose, Bld 111 (H) 65 - 99 mg/dL   BUN 5 (L) 6 - 20 mg/dL   Creatinine, Ser 1.08 (H) 0.44 - 1.00 mg/dL   Calcium 9.9 8.9 - 10.3 mg/dL   Total Protein 7.2 6.5 - 8.1 g/dL   Albumin 3.9 3.5 - 5.0 g/dL   AST 45 (H) 15 - 41 U/L   ALT 44 14 - 54 U/L   Alkaline Phosphatase 105 38 - 126 U/L   Total Bilirubin 0.7 0.3 - 1.2 mg/dL   GFR calc non Af Amer 49 (L) >60 mL/min   GFR calc Af Amer 56 (L) >60 mL/min    Comment: (NOTE) The eGFR has been calculated using the CKD EPI equation. This calculation has not been validated in all clinical situations. eGFR's persistently <60 mL/min signify possible Chronic Kidney Disease.    Anion gap 11 5 - 15    Comment: Performed at [facility] [address] [place], [place] [address]  Lipase, blood     Status: None   Collection Time: 08/24/17  3:08 AM  Result Value Ref Range   Lipase 22 11 - 51 U/L    Comment: Performed at [facility] [address], [place],  [address]  I-stat troponin, ED     Status: None   Collection Time: 08/24/17  3:14 AM  Result Value Ref Range   Troponin i, poc 0.01 0.00 - 0.08 ng/mL   Comment 3            Comment: Due to the release kinetics of cTnI, a negative result within the first hours of the onset of symptoms does not rule out myocardial infarction with certainty. If myocardial infarction is still suspected, repeat the test at appropriate intervals.     Ct Abdomen Pelvis W Contrast  Result Date: 08/24/2017 CLINICAL DATA:  Abdominal pain, acute, generalized. Nausea and vomiting that began yesterday. EXAM: CT ABDOMEN AND PELVIS WITH CONTRAST TECHNIQUE: Multidetector CT imaging of the abdomen and pelvis was performed using the standard protocol following bolus  administration of intravenous contrast. CONTRAST:  21m ISOVUE-300 IOPAMIDOL (ISOVUE-300) INJECTION 61% COMPARISON:  CT stone study 10/23/2016 FINDINGS: Lower chest: Cardiomegaly. Mild atelectatic type change. No acute finding. Hepatobiliary: Cholecystectomy with common bile duct dilatation. The common bile duct measures up to 13 mm at the pancreatic head, chronic when compared to prior. No calcified choledocholithiasis.Negative liver. Pancreas: Generalized atrophy. Spleen: Granulomatous type calcifications Adrenals/Urinary Tract: Negative adrenals. Symmetric renal atrophy with bilateral cortical cystic densities. 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 No hydronephrosis. Unremarkable bladder. Stomach/Bowel: There are distended fluid-filled loops of small bowel with transition to decompressed ileum. Near the transition is a loop of small bowel containing fecalized contents. These are findings of early/partial small bowel obstruction. No evidence of bowel necrosis perforation. No visible underlying mass. Colon is primarily decompressed. Sigmoid diverticulosis. No pericecal inflammation. Patient has history of appendectomy. Vascular/Lymphatic: Atherosclerotic calcification. No acute vascular finding. No mass or adenopathy. Reproductive:Hysterectomy Other: No ascites or pneumoperitoneum. Musculoskeletal: There is a band of sclerosis  and cortical irregularity across the right sacral ala, sacral insufficiency fracture with subacute features. Intertrochanteric right femur fracture status post fixation. Right inferior pubic ramus and right puboacetabular junction fractures with band of sclerosis. IMPRESSION: 1. Partial/early small bowel obstruction, likely from adhesive change. 2. Subacute right sacral and obturator ring insufficiency fractures. History of fall and right-sided pain early February 2019. 3.  Aortic Atherosclerosis (ICD10-I70.0). 4. Small bilateral renal artery  aneurysms measuring up to 7 mm on the left. Electronically Signed   By: Monte Fantasia M.D.   On: 08/24/2017 08:51    Review of Systems  Constitutional: Negative.   HENT: Negative.   Eyes: Negative.   Respiratory: Negative.   Cardiovascular: Negative.   Gastrointestinal: Positive for abdominal pain, constipation, nausea and vomiting. Negative for blood in stool, heartburn and melena.  Genitourinary: Negative.   Musculoskeletal: Positive for back pain, joint pain and neck pain.  Skin: Negative.   Neurological: Negative.   Endo/Heme/Allergies: Negative.   Psychiatric/Behavioral:       Patient has chronic pain medications to deal with this.   Blood pressure (!) 146/89, pulse 97, temperature 98.4 F (36.9 C), temperature source Oral, resp. rate 18, height '5\' 5"'  (1.651 m), weight 83.9 kg (185 lb), SpO2 95 %. Physical Exam  Constitutional: She is oriented to person, place, and time. She appears well-developed and well-nourished. No distress.  NG in place  HENT:  Head: Normocephalic and atraumatic.  Mouth/Throat: Oropharynx is clear and moist. No oropharyngeal exudate.  Eyes: Right eye exhibits no discharge. Left eye exhibits no discharge. No scleral icterus.  Pupils are equal  Neck: Normal range of motion. Neck supple. No JVD present. No tracheal deviation present. No thyromegaly present.  Cardiovascular: Normal rate, regular rhythm, normal heart sounds and intact distal pulses.  No murmur heard. Respiratory: Breath sounds normal. She is in respiratory distress. She has no wheezes. She has no rales. She exhibits no tenderness.  GI: She exhibits distension. She exhibits no mass. There is tenderness. There is no rebound and no guarding.  She has right upper quadrant cholecystectomy scar and midline surgical scar going from below the pubis.  Musculoskeletal: She exhibits no edema ([place]).  Lymphadenopathy:    She has no cervical adenopathy.  Neurological: She is alert and oriented to  person, place, and time. No cranial nerve deficit.  Skin: Skin is warm and dry. No rash noted. She is not diaphoretic. No erythema. No pallor.  Psychiatric: She has a normal mood and affect. Her behavior is normal. Judgment and thought content normal.    Assessment/Plan: SBO Multiple prior abdominal surgeries Constipation Chronic shoulder pain, chronic hip pain with fracture history and trochanter fracture femur History osteoporosis with chronic Chronic low back pain/chronic knee pain  Hypertension Depression Hyperlipidemia  Plan: Patient is an NG placed started on low intermittent wall suction.  We will ordered small bowel protocol for later this afternoon and follow-up in the a.m.  Continue IV hydration n.p.o. NG suction with bowel rest.  Keep her potassium around 4.0.    Javarus Dorner 08/24/2017, 9:29 AM

## 2017-08-24 NOTE — ED Notes (Signed)
Patient transported to CT 

## 2017-08-25 ENCOUNTER — Ambulatory Visit: Payer: Medicare Other

## 2017-08-25 DIAGNOSIS — K56609 Unspecified intestinal obstruction, unspecified as to partial versus complete obstruction: Secondary | ICD-10-CM

## 2017-08-25 LAB — BASIC METABOLIC PANEL
Anion gap: 9 (ref 5–15)
BUN: <5 mg/dL — ABNORMAL LOW (ref 6–20)
CO2: 24 mmol/L (ref 22–32)
Calcium: 9.1 mg/dL (ref 8.9–10.3)
Chloride: 108 mmol/L (ref 101–111)
Creatinine, Ser: 0.83 mg/dL (ref 0.44–1.00)
GFR calc Af Amer: >60 mL/min (ref >60)
GFR calc non Af Amer: >60 mL/min (ref >60)
Glucose, Bld: 104 mg/dL — ABNORMAL HIGH (ref 65–99)
Potassium: 3.6 mmol/L (ref 3.5–5.1)
Sodium: 141 mmol/L (ref 135–145)

## 2017-08-25 LAB — CBC
HCT: 45.8 % (ref 36.0–46.0)
Hemoglobin: 14.4 g/dL (ref 12.0–15.0)
MCH: 30.4 pg (ref 26.0–34.0)
MCHC: 31.4 g/dL (ref 30.0–36.0)
MCV: 96.8 fL (ref 78.0–100.0)
Platelets: 194 10*3/uL (ref 150–400)
RBC: 4.73 MIL/uL (ref 3.87–5.11)
RDW: 15.1 % (ref 11.5–15.5)
WBC: 5.9 10*3/uL (ref 4.0–10.5)

## 2017-08-25 MED ORDER — PANTOPRAZOLE SODIUM 40 MG PO TBEC: 40.0000 mg | DELAYED_RELEASE_TABLET | Freq: Two times a day (BID) | ORAL | Status: DC

## 2017-08-25 NOTE — Discharge Summary (Signed)
Physician Discharge Summary  [name] GEZ:[id number] DOB: 07/05/1940 DOA: 08/24/2017  PCP: Wenda Low, MD  Admit date: 08/24/2017 Discharge date: 08/25/2017  Time spent: 25 minutes  Recommendations for Outpatient Follow-up:  1. Recommend outpatient follow-up with PCP and liquid diet for the next couple of days  Discharge Diagnoses:  Principal Problem:   SBO (small bowel obstruction) (HCC) Active Problems:   HTN (hypertension)   GERD (gastroesophageal reflux disease)   Chronic pain syndrome   CKD (chronic kidney disease) stage 3, GFR 30-59 ml/min (HCC)   Discharge Condition: Improved  Diet recommendation: Liquid diet for the next couple of days  Filed Weights   08/24/17 0306 08/24/17 1104  Weight: 83.9 kg (185 lb) 85.6 kg (188 lb 11.4 oz)    History of present illness:  77 year old female with hypertension chronic pain Chronic constipation came to the emergency room on 3/31 and had SBO and then had NG tube placed and was kept n.p.o. General surgery consulted Gastrografin small bowel protocol pursued Patient resolved and felt by  general surgery to be cleared for discharge General surgery recommends no further follow-up as an outpatient-liquid diet for couple of days and outpatient regular PCP follow-up   Procedures:  Gastrografin general surgery  Consultations:  General surgery  Discharge Exam: Vitals:   08/24/17 2104 08/25/17 0636  BP: (!) 160/73 (!) 152/98  Pulse: (!) 101 (!) 109  Resp: 18 19  Temp: 97.6 F (36.4 C) 98.4 F (36.9 C)  SpO2: 92% 94%    General: Alert pleasant disheveled EOMI NCAT Cardiovascular: S1-S2 no murmur rub or gallop abdomen soft nontender Respiratory:  clinically clear no added sound   Discharge Instructions   Discharge Instructions    Diet - low sodium heart healthy   Complete by:  As directed    Discharge instructions   Complete by:  As directed    No changes to meds--take full liquids   Increase activity slowly    Complete by:  As directed      Allergies as of 08/25/2017      Reactions   Ampicillin Anaphylaxis, Nausea Only, Other (See Comments)   Thrush.  Muscles draw up tight - severe headache Angioedema Has patient had a PCN reaction causing immediate rash, facial/tongue/throat swelling, SOB or lightheadedness with hypotension: Yes Has patient had a PCN reaction causing severe rash involving mucus membranes or skin necrosis: No Has patient had a PCN reaction that required hospitalization:No Has patient had a PCN reaction occurring within the last 10 years: No If all of the above answers are "NO", then may proceed with Cephalosporin use.   Ampicillin-sulbactam Sodium Anaphylaxis, Other (See Comments)   Muscles draw up tight - severe headache Angioedema Has patient had a PCN reaction causing immediate rash, facial/tongue/throat swelling, SOB or lightheadedness with hypotension: Yes Has patient had a PCN reaction causing severe rash involving mucus membranes or skin necrosis: No Has patient had a PCN reaction that required hospitalization:No Has patient had a PCN reaction occurring within the last 10 years: No If all of the above answers are "NO", then may proceed with Cephalosporin use.   Toradol [ketorolac Tromethamine] Nausea Only, Other (See Comments)   Headaches, back aches   Tramadol Rash, Other (See Comments)   Headache.    Flexeril [cyclobenzaprine] Other (See Comments)   Involuntary muscle jerks   Ambien [zolpidem Tartrate] Nausea And Vomiting, Other (See Comments)   Hallucinations/sweats   Cephalexin Rash, Other (See Comments)   headaches   Percocet [oxycodone-acetaminophen] Itching,  Rash   Vicodin is OK, "itches but takes Benadryl"   Sulfamethoxazole-trimethoprim Other (See Comments)   Unknown reaction   Tape Other (See Comments)   Paper tape only. Adhesive tape=itching/burning/rash      Medication List    STOP taking these medications   vitamin B-12 1000 MCG  tablet Commonly known as:  CYANOCOBALAMIN     TAKE these medications   acetaminophen 500 MG tablet Commonly known as:  TYLENOL Take 500 mg by mouth every 6 (six) hours as needed for headache (pain).   albuterol 108 (90 Base) MCG/ACT inhaler Commonly known as:  PROVENTIL HFA;VENTOLIN HFA Inhale 2 puffs into the lungs every 6 (six) hours as needed for wheezing or shortness of breath.   albuterol (2.5 MG/3ML) 0.083% nebulizer solution Commonly known as:  PROVENTIL Take 2.5 mg by nebulization every 6 (six) hours as needed for wheezing or shortness of breath.   alendronate 70 MG tablet Commonly known as:  FOSAMAX Take 70 mg by mouth every Monday. Take with a full glass of water on an empty stomach.   ALPRAZolam 1 MG tablet Commonly known as:  XANAX Take 1 mg by mouth at bedtime.   amLODipine 5 MG tablet Commonly known as:  NORVASC Take 5 mg by mouth daily.   baclofen 10 MG tablet Commonly known as:  LIORESAL Take 1 tablet (10 mg total) by mouth daily.   BIOFREEZE EX Apply 1 application topically daily as needed (muscle pain).   CALCIUM/D3 ADULT GUMMIES PO Take 1 tablet by mouth daily.   citalopram 20 MG tablet Commonly known as:  CELEXA Take 20 mg by mouth daily.   diclofenac sodium 1 % Gel Commonly known as:  VOLTAREN Apply 2 g topically 4 (four) times daily. What changed:    when to take this  reasons to take this   docusate sodium 100 MG capsule Commonly known as:  COLACE Take 200-300 mg by mouth 2 (two) times daily.   esomeprazole 40 MG capsule Commonly known as:  NEXIUM Take 40 mg by mouth 2 (two) times daily before a meal.   gabapentin 400 MG capsule Commonly known as:  NEURONTIN Take 1 capsule (400 mg total) by mouth at bedtime. What changed:  when to take this   GINKGO BILOBA PO Take 1 capsule by mouth daily.   HYDROcodone-acetaminophen 5-325 MG tablet Commonly known as:  NORCO/VICODIN Take 1 tablet by mouth every 8 (eight) hours. What  changed:    when to take this  reasons to take this   Krill Oil 350 MG Caps Take 350 mg by mouth daily.   lactose free nutrition Liqd Take 237 mLs by mouth daily.   LINZESS 290 MCG Caps capsule Generic drug:  linaclotide Take 290 mcg by mouth daily.   lovastatin 10 MG tablet Commonly known as:  MEVACOR Take 10 mg by mouth daily.   meloxicam 15 MG tablet Commonly known as:  MOBIC Take 15 mg by mouth daily.   MULTIVITAMIN GUMMIES ADULT PO Take 1 tablet by mouth daily.   ondansetron 8 MG disintegrating tablet Commonly known as:  ZOFRAN-ODT Take 8 mg by mouth every 8 (eight) hours as needed for nausea or vomiting.   orphenadrine 100 MG tablet Commonly known as:  NORFLEX Take 1 tablet (100 mg total) by mouth 2 (two) times daily.   polyethylene glycol packet Commonly known as:  MIRALAX / GLYCOLAX Take 17 g by mouth daily.   promethazine 12.5 MG suppository Commonly known as:  PHENERGAN  Place 12.5 mg rectally as needed for nausea or vomiting.   senna 8.6 MG Tabs tablet Commonly known as:  SENOKOT Take 2 tablets by mouth 2 (two) times daily. constipation   traZODone 100 MG tablet Commonly known as:  DESYREL Take 100 mg by mouth at bedtime.      Allergies  Allergen Reactions  . Ampicillin Anaphylaxis, Nausea Only and Other (See Comments)    Thrush.  Muscles draw up tight - severe headache Angioedema Has patient had a PCN reaction causing immediate rash, facial/tongue/throat swelling, SOB or lightheadedness with hypotension: Yes Has patient had a PCN reaction causing severe rash involving mucus membranes or skin necrosis: No Has patient had a PCN reaction that required hospitalization:No Has patient had a PCN reaction occurring within the last 10 years: No If all of the above answers are "NO", then may proceed with Cephalosporin use.  . Ampicillin-Sulbactam Sodium Anaphylaxis and Other (See Comments)    Muscles draw up tight - severe headache Angioedema Has  patient had a PCN reaction causing immediate rash, facial/tongue/throat swelling, SOB or lightheadedness with hypotension: Yes Has patient had a PCN reaction causing severe rash involving mucus membranes or skin necrosis: No Has patient had a PCN reaction that required hospitalization:No Has patient had a PCN reaction occurring within the last 10 years: No If all of the above answers are "NO", then may proceed with Cephalosporin use.   . Toradol [Ketorolac Tromethamine] Nausea Only and Other (See Comments)    Headaches, back aches  . Tramadol Rash and Other (See Comments)    Headache.   Yvette Rack [Cyclobenzaprine] Other (See Comments)    Involuntary muscle jerks  . Ambien [Zolpidem Tartrate] Nausea And Vomiting and Other (See Comments)    Hallucinations/sweats  . Cephalexin Rash and Other (See Comments)    headaches  . Percocet [Oxycodone-Acetaminophen] Itching and Rash    Vicodin is OK, "itches but takes Benadryl"  . Sulfamethoxazole-Trimethoprim Other (See Comments)    Unknown reaction  . Tape Other (See Comments)    Paper tape only. Adhesive tape=itching/burning/rash      The results of significant diagnostics from this hospitalization (including imaging, microbiology, ancillary and laboratory) are listed below for reference.    Significant Diagnostic Studies: Ct Abdomen Pelvis W Contrast  Result Date: 08/24/2017 CLINICAL DATA:  Abdominal pain, acute, generalized. Nausea and vomiting that began yesterday. EXAM: CT ABDOMEN AND PELVIS WITH CONTRAST TECHNIQUE: Multidetector CT imaging of the abdomen and pelvis was performed using the standard protocol following bolus administration of intravenous contrast. CONTRAST:  10mL ISOVUE-300 IOPAMIDOL (ISOVUE-300) INJECTION 61% COMPARISON:  CT stone study 10/23/2016 FINDINGS: Lower chest: Cardiomegaly. Mild atelectatic type change. No acute finding. Hepatobiliary: Cholecystectomy with common bile duct dilatation. The common bile duct measures  up to 13 mm at the pancreatic head, chronic when compared to prior. No calcified choledocholithiasis.Negative liver. Pancreas: Generalized atrophy. Spleen: Granulomatous type calcifications Adrenals/Urinary Tract: Negative adrenals. Symmetric renal atrophy with bilateral cortical cystic densities. 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 No hydronephrosis. Unremarkable bladder. Stomach/Bowel: There are distended fluid-filled loops of small bowel with transition to decompressed ileum. Near the transition is a loop of small bowel containing fecalized contents. These are findings of early/partial small bowel obstruction. No evidence of bowel necrosis perforation. No visible underlying mass. Colon is primarily decompressed. Sigmoid diverticulosis. No pericecal inflammation. Patient has history of appendectomy. Vascular/Lymphatic: Atherosclerotic calcification. No acute vascular finding. No mass or adenopathy. Reproductive:Hysterectomy Other: No  ascites or pneumoperitoneum. Musculoskeletal: There is a band of sclerosis and cortical irregularity across the right sacral ala, sacral insufficiency fracture with subacute features. Intertrochanteric right femur fracture status post fixation. Right inferior pubic ramus and right puboacetabular junction fractures with band of sclerosis. IMPRESSION: 1. Partial/early small bowel obstruction, likely from adhesive change. 2. Subacute right sacral and obturator ring insufficiency fractures. History of fall and right-sided pain early February 2019. 3.  Aortic Atherosclerosis (ICD10-I70.0). 4. 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 Electronically Signed   By: Monte Fantasia M.D.   On: 08/24/2017 08:51   Dg Abd Portable 1v-small Bowel Obstruction Protocol-initial, 8 Hr Delay  Result Date: 08/25/2017 CLINICAL DATA:  8 hour delay for small bowel obstruction. EXAM: PORTABLE ABDOMEN - 1 VIEW COMPARISON:  CT  from 03/31 FINDINGS: Oral contrast is noted within the colon from cecum to rectum. No significant small bowel dilatation. Gastric tube is in the expected location of the stomach. Rim-like calcifications are noted in the upper abdomen bilaterally compatible with documented renal arterial aneurysms. No radio-opaque calculi. Sclerosis of the right sacral ala compatible with CT documented sacral insufficiency fracture. IMPRESSION: 1. No evidence of small bowel obstruction. Enteric contrast is noted within the colon. 2. Sclerosis of the right sacral ala compatible with right sacral ale are insufficiency fracture. 3. Rim-like calcifications along the expected location of the renal hila bilaterally compatible with known renal artery aneurysms. Electronically Signed   By: Ashley Royalty M.D.   On: 08/25/2017 03:51   Dg Abd Portable 1v  Result Date: 08/24/2017 CLINICAL DATA:  Nasogastric tube placement. EXAM: PORTABLE ABDOMEN - 1 VIEW COMPARISON:  08/24/2017 CT FINDINGS: Nasogastric tube terminates at the proximal stomach with side port in the region of the gastroesophageal junction. Contrast within right renal collecting system. No free intraperitoneal air or significant bowel dilatation identified. IMPRESSION: Nasogastric tube terminating in the proximal stomach with side port in the region of the gastroesophageal junction. Consider advancement. Electronically Signed   By: Abigail Miyamoto M.D.   On: 08/24/2017 12:16   Dg Hip Unilat W Or W/o Pelvis 2-3 Views Right  Result Date: 08/12/2017 CLINICAL DATA:  Chronic right hip pain, surgery on the right hip 1 year ago, fell 1 week ago EXAM: DG HIP (WITH OR WITHOUT PELVIS) 2-3V RIGHT COMPARISON:  Pelvis and right hip films of 06/25/2017 FINDINGS: Intramedullary rod is present for fixation of prior intertrochanteric femoral fracture. The fracture line through the intertrochanteric femur is less evident, consistent with some interval healing. No acute fracture is seen. The bones  are diffusely osteopenic. The pelvic rami are intact. There is some sclerosis however noted through the right sacrum and healing fracture would be difficult to exclude. CT of the pelvis may be helpful if further assessment is warranted clinically. IMPRESSION: 1. Cannot exclude subtle fracture through the right sacrum. Consider CT of the pelvis to assess further. 2. No acute fracture of the right hip after pinning. Healing fracture of the right intertrochanteric femur. 3. Osteopenia. Electronically Signed   By: Ivar Drape M.D.   On: 08/12/2017 13:35    Microbiology: No results found for this or any previous visit (from the past 240 hour(s)).   Labs: Basic Metabolic Panel: Recent Labs  Lab 08/24/17 0308 08/24/17 1212 08/25/17 0424  NA 143  --  141  K 3.5  --  3.6  CL 108  --  108  CO2 24  --  24  GLUCOSE 111*  --  104*  BUN 5*  --  <5*  CREATININE 1.08* 0.88 0.83  CALCIUM 9.9  --  9.1   Liver Function Tests: Recent Labs  Lab 08/24/17 0308  AST 45*  ALT 44  ALKPHOS 105  BILITOT 0.7  PROT 7.2  ALBUMIN 3.9   Recent Labs  Lab 08/24/17 0308  LIPASE 22   No results for input(s): AMMONIA in the last 168 hours. CBC: Recent Labs  Lab 08/24/17 0308 08/24/17 1212 08/25/17 0424  WBC 8.1 6.2 5.9  NEUTROABS 5.4  --   --   HGB 15.3* 13.9 14.4  HCT 46.7* 44.1 45.8  MCV 95.3 96.1 96.8  PLT 214 199 194   Cardiac Enzymes: No results for input(s): CKTOTAL, CKMB, CKMBINDEX, TROPONINI in the last 168 hours. BNP: BNP (last 3 results) No results for input(s): BNP in the last 8760 hours.  ProBNP (last 3 results) No results for input(s): PROBNP in the last 8760 hours.  CBG: No results for input(s): GLUCAP in the last 168 hours.     Signed:  Nita Sells MD   [facility] 08/25/2017, 12:05 PM

## 2017-08-25 NOTE — Progress Notes (Signed)
[facility] Progress Note     Subjective: CC- NG tube Patient reports no abdominal pain or bloating this morning. Main complaint is the NG tube and that she wants to go home. Denies n/v. She is passing flatus and had multiple loose BMs over night.   Objective: Vital signs in last 24 hours: Temp:  [97.6 F (36.4 C)-98.4 F (36.9 C)] 98.4 F (36.9 C) (04/01 0636) Pulse Rate:  [93-109] 109 (04/01 0636) Resp:  [16-19] 19 (04/01 0636) BP: (132-164)/(73-98) 152/98 (04/01 0636) SpO2:  [89 %-98 %] 94 % (04/01 0636) Weight:  [188 lb 11.4 oz (85.6 kg)] 188 lb 11.4 oz (85.6 kg) (03/31 1104) Last BM Date: 08/25/17  Intake/Output from previous day: 03/31 0701 - 04/01 0700 In: 1766.7 [I.V.:1766.7] Out: 579 [Urine:2; Emesis/NG output:575; Stool:2] Intake/Output this shift: No intake/output data recorded.  PE: Gen:  Alert, NAD, pleasant HEENT: EOM's intact, pupils equal and round Card:  RRR, no M/G/R heard Pulm:  effort normal Abd: Soft, NT/ND, +BS in all 4 quadrants, no HSM, no hernia Ext:  Calves soft and nontender Skin: no rashes noted, warm and dry  Lab Results:  Recent Labs    08/24/17 1212 08/25/17 0424  WBC 6.2 5.9  HGB 13.9 14.4  HCT 44.1 45.8  PLT 199 194   BMET Recent Labs    08/24/17 0308 08/24/17 1212 08/25/17 0424  NA 143  --  141  K 3.5  --  3.6  CL 108  --  108  CO2 24  --  24  GLUCOSE 111*  --  104*  BUN 5*  --  <5*  CREATININE 1.08* 0.88 0.83  CALCIUM 9.9  --  9.1   PT/INR No results for input(s): LABPROT, INR in the last 72 hours. CMP     Component Value Date/Time   NA 141 08/25/2017 0424   NA 145 (H) 12/04/2016 1525   K 3.6 08/25/2017 0424   CL 108 08/25/2017 0424   CO2 24 08/25/2017 0424   GLUCOSE 104 (H) 08/25/2017 0424   BUN <5 (L) 08/25/2017 0424   BUN 7 (L) 12/04/2016 1525   CREATININE 0.83 08/25/2017 0424   CALCIUM 9.1 08/25/2017 0424   PROT 7.2 08/24/2017 0308   PROT 7.7 12/04/2016 1525   ALBUMIN 3.9 08/24/2017  0308   ALBUMIN 4.0 12/04/2016 1525   AST 45 (H) 08/24/2017 0308   ALT 44 08/24/2017 0308   ALKPHOS 105 08/24/2017 0308   BILITOT 0.7 08/24/2017 0308   BILITOT 0.2 12/04/2016 1525   GFRNONAA >60 08/25/2017 0424   GFRAA >60 08/25/2017 0424   Lipase     Component Value Date/Time   LIPASE 22 08/24/2017 0308       Studies/Results: Ct Abdomen Pelvis W Contrast  Result Date: 08/24/2017 CLINICAL DATA:  Abdominal pain, acute, generalized. Nausea and vomiting that began yesterday. EXAM: CT ABDOMEN AND PELVIS WITH CONTRAST TECHNIQUE: Multidetector CT imaging of the abdomen and pelvis was performed using the standard protocol following bolus administration of intravenous contrast. CONTRAST:  77mL ISOVUE-300 IOPAMIDOL (ISOVUE-300) INJECTION 61% COMPARISON:  CT stone study 10/23/2016 FINDINGS: Lower chest: Cardiomegaly. Mild atelectatic type change. No acute finding. Hepatobiliary: Cholecystectomy with common bile duct dilatation. The common bile duct measures up to 13 mm at the pancreatic head, chronic when compared to prior. No calcified choledocholithiasis.Negative liver. Pancreas: Generalized atrophy. Spleen: Granulomatous type calcifications Adrenals/Urinary Tract: Negative adrenals. Symmetric renal atrophy with bilateral cortical cystic densities. 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 No hydronephrosis. Unremarkable bladder. Stomach/Bowel: There are distended fluid-filled loops of small bowel with transition to decompressed ileum. Near the transition is a loop of small bowel containing fecalized contents. These are findings of early/partial small bowel obstruction. No evidence of bowel necrosis perforation. No visible underlying mass. Colon is primarily decompressed. Sigmoid diverticulosis. No pericecal inflammation. Patient has history of appendectomy. Vascular/Lymphatic: Atherosclerotic calcification. No acute vascular finding. No mass or adenopathy.  Reproductive:Hysterectomy Other: No ascites or pneumoperitoneum. Musculoskeletal: There is a band of sclerosis and cortical irregularity across the right sacral ala, sacral insufficiency fracture with subacute features. Intertrochanteric right femur fracture status post fixation. Right inferior pubic ramus and right puboacetabular junction fractures with band of sclerosis. IMPRESSION: 1. Partial/early small bowel obstruction, likely from adhesive change. 2. Subacute right sacral and obturator ring insufficiency fractures. History of fall and right-sided pain early February 2019. 3.  Aortic Atherosclerosis (ICD10-I70.0). 4. 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 Electronically Signed   By: Monte Fantasia M.D.   On: 08/24/2017 08:51   Dg Abd Portable 1v-small Bowel Obstruction Protocol-initial, 8 Hr Delay  Result Date: 08/25/2017 CLINICAL DATA:  8 hour delay for small bowel obstruction. EXAM: PORTABLE ABDOMEN - 1 VIEW COMPARISON:  CT from 03/31 FINDINGS: Oral contrast is noted within the colon from cecum to rectum. No significant small bowel dilatation. Gastric tube is in the expected location of the stomach. Rim-like calcifications are noted in the upper abdomen bilaterally compatible with documented renal arterial aneurysms. No radio-opaque calculi. Sclerosis of the right sacral ala compatible with CT documented sacral insufficiency fracture. IMPRESSION: 1. No evidence of small bowel obstruction. Enteric contrast is noted within the colon. 2. Sclerosis of the right sacral ala compatible with right sacral ale are insufficiency fracture. 3. Rim-like calcifications along the expected location of the renal hila bilaterally compatible with known renal artery aneurysms. Electronically Signed   By: Ashley Royalty M.D.   On: 08/25/2017 03:51   Dg Abd Portable 1v  Result Date: 08/24/2017 CLINICAL DATA:  Nasogastric tube placement. EXAM: PORTABLE ABDOMEN - 1 VIEW COMPARISON:  08/24/2017  CT FINDINGS: Nasogastric tube terminates at the proximal stomach with side port in the region of the gastroesophageal junction. Contrast within right renal collecting system. No free intraperitoneal air or significant bowel dilatation identified. IMPRESSION: Nasogastric tube terminating in the proximal stomach with side port in the region of the gastroesophageal junction. Consider advancement. Electronically Signed   By: Abigail Miyamoto M.D.   On: 08/24/2017 12:16    Anti-infectives: Anti-infectives (From admission, onward)   None       Assessment/Plan Constipation Chronic shoulder pain, chronic hip pain with fracture history and trochanter fracture femur History osteoporosis with chronic Chronic low back pain/chronic knee pain  Hypertension Depression Hyperlipidemia  SBO - Multiple prior abdominal surgeries - SB protocol started 3/31 - XR last night showed contrast in colon and no evidence of SBO - passing flatus and having bowel function  ID - none FEN - IVF, full liquids VTE - lovenox Foley - none Follow up - no surgery follow up needed  Plan - D/c NG tube and advance to full liquids. Ambulate patient.    LOS: 1 day    Wellington Hampshire , [facility] 08/25/2017, 9:08 AM Pager: [phone] Consults: [phone] Mon-Fri 7:00 am-4:30 pm Sat-Sun 7:00 am-11:30 am

## 2017-08-29 ENCOUNTER — Telehealth: Payer: Self-pay | Admitting: Pain Medicine

## 2017-08-29 NOTE — Telephone Encounter (Signed)
Forwarded to [employer].

## 2017-08-29 NOTE — Telephone Encounter (Signed)
Left appt on 08-12-17 with the understanding that her mother, Damary Doland would be set up for RF appt. Please have Crystal or Dr. Dossie Arbour put in order for RF and send to [facility] for PA. Thank you.

## 2017-09-01 ENCOUNTER — Other Ambulatory Visit: Payer: Self-pay | Admitting: Nurse Practitioner

## 2017-09-01 ENCOUNTER — Ambulatory Visit: Payer: Medicare Other

## 2017-09-01 DIAGNOSIS — M533 Sacrococcygeal disorders, not elsewhere classified: Secondary | ICD-10-CM

## 2017-09-01 DIAGNOSIS — M47817 Spondylosis without myelopathy or radiculopathy, lumbosacral region: Secondary | ICD-10-CM

## 2017-09-01 DIAGNOSIS — G8929 Other chronic pain: Secondary | ICD-10-CM

## 2017-09-01 DIAGNOSIS — M47816 Spondylosis without myelopathy or radiculopathy, lumbar region: Secondary | ICD-10-CM

## 2017-09-01 NOTE — Telephone Encounter (Signed)
This patient has already had 2 diagnostic facets done. Shall I get prior auth for the RFA?

## 2017-09-01 NOTE — Telephone Encounter (Signed)
Message forwarded to PM admin and [employer].

## 2017-09-01 NOTE — Telephone Encounter (Signed)
I instructed her daughter that she needed to have the Lumbar facet repeated secondary to having less than 50% with the first NB. I did tell her that I would clarify with Dr Lowella Dandy. I have done this and she needs to have another facet and it has been ordered. Please explain to her that the RFA is very different from the facet (Procedure).  I wonder if her mother is really a good candidate but we will just wait and see. Please make [name] aware so she can get the approval  Thanks

## 2017-09-04 NOTE — Telephone Encounter (Signed)
No she did not get adequate relief with the first one so she is not going to be approved. She needs the repeat facet NB thanks

## 2017-09-04 NOTE — Telephone Encounter (Signed)
Blanch Media, I think that the patient needs to have another Facet because she didn't get the relief required to proceed with RF.  I think that is why they are asking for 3rd facet.  Does she need approval

## 2017-09-05 ENCOUNTER — Other Ambulatory Visit (HOSPITAL_COMMUNITY): Payer: Medicare Other

## 2017-09-08 ENCOUNTER — Encounter: Payer: Medicare Other | Admitting: Nurse Practitioner

## 2017-09-10 DIAGNOSIS — Z8719 Personal history of other diseases of the digestive system: Secondary | ICD-10-CM

## 2017-09-10 DIAGNOSIS — S72001G Fracture of unspecified part of neck of right femur, subsequent encounter for closed fracture with delayed healing: Secondary | ICD-10-CM

## 2017-09-10 NOTE — H&P (Signed)
HIP ARTHROPLASTY ADMISSION H&P  Patient ID: [name] MRN: [id number] DOB/AGE: 1940-09-05 77 y.o.  Chief Complaint: right hip pain.  Planned Procedure Date: 09/30/2017 Medical and cardiac clearance by Dr. Lysle Rubens Additional clearance by pain management: Dr Dossie Arbour  HPI: [name] is a 77 y.o. female with a history of hypertension, hyperlipidemia, CKD, GERD, multiple abdominal surgeries and recent SBO.  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 The patient has a history of pain and functional disability in the right hip due to arthritis and has failed non-surgical conservative treatments for greater than 12 weeks to include NSAID's and/or analgesics, corticosteriod injections, use of assistive devices and activity modification.  Onset of symptoms was abrupt, starting 1 years ago with gradually worsening course since that time.  Patient currently rates pain at 9 out of 10 with activity. Patient has night pain, worsening of pain with activity and weight bearing and pain that interferes with activities of daily living.  Patient has evidence of periarticular osteophytes and joint space narrowing on XR.  CT scan shows nonunion of her fracture.  There is no active infection.  Past Medical History:  Diagnosis Date  . Anemia   . B12 deficiency   . Bacteremia 10/07/2014  . Bladder incontinence   . Blind left eye   . Cataract   . Cervical spine fracture ([place])   . Chest pain 07/29/2013  . Compression fracture    C7,  upper T spine -compression fx  . COPD (chronic obstructive pulmonary disease) ([place])   . DDD (degenerative disc disease), lumbar   . Depression    major  . Diffuse myofascial pain syndrome 03/24/2015  . Fever 10/05/2014  . GERD (gastroesophageal reflux disease)   . Hiatal hernia   . Hyperlipidemia   . Hypertension   . 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  Macular degeneration   . Osteoarthritis   . Osteoporosis   . Reactive airway disease   . Rupture of bowel ([place])   . Sepsis ([place]) 10/08/2014  . Stroke [facility])    "mini stroke"  . Stroke ([place])   . Wrist pain, acute 09/10/2012   Past Surgical History:  Procedure Laterality Date  . ABDOMINAL HYSTERECTOMY    . ABDOMINAL SURGERY    . APPENDECTOMY    . BLADDER SURGERY    . CHOLECYSTECTOMY    . COLON SURGERY    . EYE SURGERY     cataracts  . HIP SURGERY Right 08/2016  . INTRAMEDULLARY (IM) NAIL INTERTROCHANTERIC Right 09/13/2016   Procedure: INTRAMEDULLARY (IM) NAIL INTERTROCHANTRIC RIGHT;  Surgeon: Leandrew Koyanagi, MD;  Location: [place];  Service: Orthopedics;  Laterality: Right;  . JOINT REPLACEMENT    . KNEE SURGERY    . OSTOMY    . RECTOCELE REPAIR     Allergies  Allergen Reactions  . Ampicillin Anaphylaxis, Nausea Only and Other (See Comments)    Thrush.  Muscles draw up tight - severe headache Angioedema Has patient had a PCN reaction causing immediate rash, facial/tongue/throat swelling, SOB or lightheadedness with hypotension: Yes Has patient had a PCN reaction causing severe rash involving mucus membranes or skin necrosis: No Has patient had a PCN reaction that required hospitalization:No Has patient had a PCN reaction occurring within the last 10 years: No If all of the above answers are "NO", then may proceed with Cephalosporin use.  . Ampicillin-Sulbactam Sodium Anaphylaxis and Other (  See Comments)    Muscles draw up tight - severe headache Angioedema Has patient had a PCN reaction causing immediate rash, facial/tongue/throat swelling, SOB or lightheadedness with hypotension: Yes Has patient had a PCN reaction causing severe rash involving mucus membranes or skin necrosis: No Has patient had a PCN reaction that required hospitalization:No Has patient had a PCN reaction occurring within the last 10 years: No If all of the above answers are "NO", then may proceed with  Cephalosporin use.   . Toradol [Ketorolac Tromethamine] Nausea Only and Other (See Comments)    Headaches, back aches  . Tramadol Rash and Other (See Comments)    Headache.   Yvette Rack [Cyclobenzaprine] Other (See Comments)    Involuntary muscle jerks  . Ambien [Zolpidem Tartrate] Nausea And Vomiting and Other (See Comments)    Hallucinations/sweats  . Cephalexin Rash and Other (See Comments)    headaches  . Percocet [Oxycodone-Acetaminophen] Itching and Rash    Vicodin is OK, "itches but takes Benadryl"  . Sulfamethoxazole-Trimethoprim Other (See Comments)    Unknown reaction  . Tape Other (See Comments)    Paper tape only. Adhesive tape=itching/burning/rash   Prior to Admission medications   Medication Sig Start Date End Date Taking? Authorizing Provider  acetaminophen (TYLENOL) 500 MG tablet Take 500 mg by mouth every 6 (six) hours as needed for headache (pain).    [provider]  albuterol (PROVENTIL HFA;VENTOLIN HFA) 108 (90 BASE) MCG/ACT inhaler Inhale 2 puffs into the lungs every 6 (six) hours as needed for wheezing or shortness of breath.     [provider]  albuterol (PROVENTIL) (2.5 MG/3ML) 0.083% nebulizer solution Take 2.5 mg by nebulization every 6 (six) hours as needed for wheezing or shortness of breath.    [provider]  alendronate (FOSAMAX) 70 MG tablet Take 70 mg by mouth every Monday. Take with a full glass of water on an empty stomach.    [provider]  ALPRAZolam Duanne Moron) 1 MG tablet Take 1 mg by mouth at bedtime.  02/03/17   [provider]  amLODipine (NORVASC) 5 MG tablet Take 5 mg by mouth daily.  04/06/13   [provider]  baclofen (LIORESAL) 10 MG tablet Take 1 tablet (10 mg total) by mouth daily. Patient not taking: Reported on 07/15/2017 03/05/17 07/15/17  Milinda Pointer, MD  Calcium-Phosphorus-Vitamin D (CALCIUM/D3 ADULT GUMMIES PO) Take 1 tablet by mouth daily.    [provider]   citalopram (CELEXA) 20 MG tablet Take 20 mg by mouth daily.  02/27/17   [provider]  diclofenac sodium (VOLTAREN) 1 % GEL Apply 2 g topically 4 (four) times daily. Patient taking differently: Apply 2 g topically 4 (four) times daily as needed (pain).  08/12/17 11/10/17  Vevelyn Francois, NP  docusate sodium (COLACE) 100 MG capsule Take 200-300 mg by mouth 2 (two) times daily.     [provider]  esomeprazole (NEXIUM) 40 MG capsule Take 40 mg by mouth 2 (two) times daily before a meal.     [provider]  gabapentin (NEURONTIN) 400 MG capsule Take 1 capsule (400 mg total) by mouth at bedtime. Patient taking differently: Take 400 mg by mouth daily.  08/12/17 11/10/17  Vevelyn Francois, NP  GINKGO BILOBA PO Take 1 capsule by mouth daily.     [provider]  HYDROcodone-acetaminophen (NORCO/VICODIN) 5-325 MG tablet Take 1 tablet by mouth every 8 (eight) hours. Patient taking differently: Take 1 tablet by mouth every  6 (six) hours as needed (pain).  08/12/17 09/11/17  Vevelyn Francois, NP  Krill Oil 350 MG CAPS Take 350 mg by mouth daily.    [provider]  lactose free nutrition (BOOST) LIQD Take 237 mLs by mouth daily.    [provider]  linaclotide (LINZESS) 290 MCG CAPS capsule Take 290 mcg by mouth daily.    [provider]  lovastatin (MEVACOR) 10 MG tablet Take 10 mg by mouth daily. 09/22/14   [provider]  meloxicam (MOBIC) 15 MG tablet Take 15 mg by mouth daily.  05/23/16   [provider]  Menthol, Topical Analgesic, (BIOFREEZE EX) Apply 1 application topically daily as needed (muscle pain).    [provider]  Multiple Vitamins-Minerals (MULTIVITAMIN GUMMIES ADULT PO) Take 1 tablet by mouth daily.    [provider]  ondansetron (ZOFRAN-ODT) 8 MG disintegrating tablet Take 8 mg by mouth every 8 (eight) hours as needed for nausea or vomiting.    [provider]  orphenadrine (NORFLEX)  100 MG tablet Take 1 tablet (100 mg total) by mouth 2 (two) times daily. 04/24/17   Vevelyn Francois, NP  polyethylene glycol (MIRALAX / GLYCOLAX) packet Take 17 g by mouth daily.    [provider]  promethazine (PHENERGAN) 12.5 MG suppository Place 12.5 mg rectally as needed for nausea or vomiting.    [provider]  senna (SENOKOT) 8.6 MG TABS tablet Take 2 tablets by mouth 2 (two) times daily. constipation     [provider]  traZODone (DESYREL) 100 MG tablet Take 100 mg by mouth at bedtime.     [provider]   Social history: Widowed former smoker.  One pack per day times 35 years.  Quit 5 years ago.  No EtOH.  She lives in a one-story residence.  They have a ramp to enter the home.  Family history: Mother with heart disease, HTN, DM, kidney disease.  Father with heart disease, MI, HTN.  Brother with heart disease, MI, HTN.  Sister with HTN, seizures, CKD, cancer.  Grandparents with hypertension and seizures.  Child with hypertension and cancer.  ROS: Currently denies lightheadedness, dizziness, Fever, chills, CP, SOB.  No personal history of DVT, PE, or MI. She has dentures.  Decreased GI motility and frequent constipation.  Occasional headache/migraine.  Occasional lower extremity swelling. All other systems have been reviewed and were otherwise currently negative with the exception of those mentioned in the HPI and as above.  Objective: Vitals: Ht: 5 feet 4 inches wt: 186 temp: 98 BP: 165/83 pulse: 87 O2 93 % on room air.   Physical Exam: General: Alert, NAD. Trendelenberg Gait.  Utilizes rolling walker. HEENT: EOMI, Good Neck Extension  Pulm: No increased work of breathing.  Clear B/L A/P w/o crackle or wheeze.  CV: RRR, No m/g/r appreciated  GI: soft, NT, ND Neuro: Neuro without gross focal deficit.  Sensation intact distally Skin: No lesions in the area of chief complaint MSK/Surgical Site: Right Hip pain with passive ROM.  Positive  Stinchfield.  4/5 strength.  NVI.  Sensation intact distally.  Imaging Review Plain radiographs demonstrate moderate degenerative joint disease of the right hip. CT shows non-union of basocervical fracture.  Preoperative templating of the joint replacement has been completed, documented, and submitted to the Operating Room personnel in order to optimize intra-operative equipment management.  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 The treatment options including medical management, injection therapy, and arthroplasty were discussed at length. The risks and benefits of Procedure(s): RIGHT HIP IM NAIL REMOVAL WITH CONVERSION TO TOTAL HIP ARTHOPLASTY were presented and reviewed.  The risks of nonoperative treatment, versus surgical intervention including but not limited to continued pain, aseptic loosening, stiffness, dislocation/subluxation, infection, bleeding, nerve injury, blood clots, cardiopulmonary complications, morbidity, mortality, among others were discussed. The patient verbalizes understanding and wishes to proceed with the plan.  Patient is being admitted for inpatient treatment for surgery, pain control, PT, OT, prophylactic antibiotics, VTE prophylaxis, progressive ambulation, ADL's and discharge planning.   Dental prophylaxis discussed and recommended for 2 years postoperatively.  She has dentures.   The  patient does  meet the criteria for TXA which will be used perioperatively.    Lovenox will be used postoperatively for DVT prophylaxis in addition to SCDs, and early ambulation.  Norco for pain.  Resume Mobic for inflammation.  Resume Nexium for gastroprotection / GERD.  Plan to prescribe Movantik to prevent narcotic induced constipation dt h/o obstruction and chronic slow GI motility.  Continue home nausea / constipation medicines.  The patient is planning to be discharged home with home health services ([facility]) in care of her daughter.   Prudencio Burly III, PA-C 09/10/2017 1:54 PM

## 2017-09-15 ENCOUNTER — Inpatient Hospital Stay: Admit: 2017-09-15 | Payer: Medicare Other | Admitting: Orthopedic Surgery

## 2017-09-15 SURGERY — SYNOVECTOMY WITH POLY EXCHANGE
Anesthesia: Spinal | Laterality: Left

## 2017-09-16 ENCOUNTER — Other Ambulatory Visit: Payer: Self-pay

## 2017-09-16 ENCOUNTER — Ambulatory Visit: Payer: Medicare Other | Attending: Nurse Practitioner | Admitting: Nurse Practitioner

## 2017-09-16 ENCOUNTER — Encounter: Payer: Self-pay | Admitting: Nurse Practitioner

## 2017-09-16 VITALS — BP 140/108 | HR 110 | Temp 98.2°F | Resp 16 | Ht 64.0 in | Wt 186.0 lb

## 2017-09-16 DIAGNOSIS — Z789 Other specified health status: Secondary | ICD-10-CM | POA: Insufficient documentation

## 2017-09-16 DIAGNOSIS — M792 Neuralgia and neuritis, unspecified: Secondary | ICD-10-CM

## 2017-09-16 DIAGNOSIS — M5136 Other intervertebral disc degeneration, lumbar region: Secondary | ICD-10-CM | POA: Insufficient documentation

## 2017-09-16 DIAGNOSIS — H5462 Unqualified visual loss, left eye, normal vision right eye: Secondary | ICD-10-CM | POA: Diagnosis not present

## 2017-09-16 DIAGNOSIS — E785 Hyperlipidemia, unspecified: Secondary | ICD-10-CM | POA: Diagnosis not present

## 2017-09-16 DIAGNOSIS — K219 Gastro-esophageal reflux disease without esophagitis: Secondary | ICD-10-CM | POA: Insufficient documentation

## 2017-09-16 DIAGNOSIS — H3530 Unspecified macular degeneration: Secondary | ICD-10-CM | POA: Diagnosis not present

## 2017-09-16 DIAGNOSIS — E538 Deficiency of other specified B group vitamins: Secondary | ICD-10-CM | POA: Diagnosis not present

## 2017-09-16 DIAGNOSIS — N183 Chronic kidney disease, stage 3 (moderate): Secondary | ICD-10-CM | POA: Diagnosis not present

## 2017-09-16 DIAGNOSIS — M25551 Pain in right hip: Secondary | ICD-10-CM | POA: Diagnosis not present

## 2017-09-16 DIAGNOSIS — Z7983 Long term (current) use of bisphosphonates: Secondary | ICD-10-CM | POA: Diagnosis not present

## 2017-09-16 DIAGNOSIS — Z881 Allergy status to other antibiotic agents status: Secondary | ICD-10-CM | POA: Insufficient documentation

## 2017-09-16 DIAGNOSIS — G894 Chronic pain syndrome: Secondary | ICD-10-CM | POA: Diagnosis not present

## 2017-09-16 DIAGNOSIS — M545 Low back pain: Secondary | ICD-10-CM | POA: Diagnosis present

## 2017-09-16 DIAGNOSIS — R413 Other amnesia: Secondary | ICD-10-CM | POA: Insufficient documentation

## 2017-09-16 DIAGNOSIS — M810 Age-related osteoporosis without current pathological fracture: Secondary | ICD-10-CM | POA: Insufficient documentation

## 2017-09-16 DIAGNOSIS — J449 Chronic obstructive pulmonary disease, unspecified: Secondary | ICD-10-CM | POA: Diagnosis not present

## 2017-09-16 DIAGNOSIS — M533 Sacrococcygeal disorders, not elsewhere classified: Secondary | ICD-10-CM | POA: Insufficient documentation

## 2017-09-16 DIAGNOSIS — Z9049 Acquired absence of other specified parts of digestive tract: Secondary | ICD-10-CM | POA: Insufficient documentation

## 2017-09-16 DIAGNOSIS — Z79891 Long term (current) use of opiate analgesic: Secondary | ICD-10-CM | POA: Insufficient documentation

## 2017-09-16 DIAGNOSIS — I129 Hypertensive chronic kidney disease with stage 1 through stage 4 chronic kidney disease, or unspecified chronic kidney disease: Secondary | ICD-10-CM | POA: Insufficient documentation

## 2017-09-16 DIAGNOSIS — Z90710 Acquired absence of both cervix and uterus: Secondary | ICD-10-CM | POA: Insufficient documentation

## 2017-09-16 DIAGNOSIS — M47817 Spondylosis without myelopathy or radiculopathy, lumbosacral region: Secondary | ICD-10-CM | POA: Diagnosis not present

## 2017-09-16 DIAGNOSIS — Z885 Allergy status to narcotic agent status: Secondary | ICD-10-CM | POA: Insufficient documentation

## 2017-09-16 DIAGNOSIS — Z791 Long term (current) use of non-steroidal anti-inflammatories (NSAID): Secondary | ICD-10-CM | POA: Insufficient documentation

## 2017-09-16 DIAGNOSIS — Z880 Allergy status to penicillin: Secondary | ICD-10-CM | POA: Insufficient documentation

## 2017-09-16 DIAGNOSIS — F329 Major depressive disorder, single episode, unspecified: Secondary | ICD-10-CM | POA: Insufficient documentation

## 2017-09-16 DIAGNOSIS — Z79899 Other long term (current) drug therapy: Secondary | ICD-10-CM | POA: Diagnosis not present

## 2017-09-16 DIAGNOSIS — M7918 Myalgia, other site: Secondary | ICD-10-CM

## 2017-09-16 DIAGNOSIS — Z87891 Personal history of nicotine dependence: Secondary | ICD-10-CM | POA: Diagnosis not present

## 2017-09-16 DIAGNOSIS — D696 Thrombocytopenia, unspecified: Secondary | ICD-10-CM | POA: Diagnosis not present

## 2017-09-16 DIAGNOSIS — Z96653 Presence of artificial knee joint, bilateral: Secondary | ICD-10-CM | POA: Insufficient documentation

## 2017-09-16 DIAGNOSIS — Z888 Allergy status to other drugs, medicaments and biological substances status: Secondary | ICD-10-CM | POA: Insufficient documentation

## 2017-09-16 DIAGNOSIS — Z8249 Family history of ischemic heart disease and other diseases of the circulatory system: Secondary | ICD-10-CM | POA: Insufficient documentation

## 2017-09-16 DIAGNOSIS — G8929 Other chronic pain: Secondary | ICD-10-CM

## 2017-09-16 DIAGNOSIS — Z882 Allergy status to sulfonamides status: Secondary | ICD-10-CM | POA: Insufficient documentation

## 2017-09-16 MED ORDER — GABAPENTIN 400 MG PO CAPS: 400.0000 mg | ORAL_CAPSULE | Freq: Every day | ORAL | 1 refills | Status: DC

## 2017-09-16 MED ORDER — HYDROCODONE-ACETAMINOPHEN 5-325 MG PO TABS: 1.0000 | ORAL_TABLET | Freq: Three times a day (TID) | ORAL | 0 refills | Status: DC

## 2017-09-16 MED ORDER — BACLOFEN 10 MG PO TABS: 10.0000 mg | ORAL_TABLET | Freq: Every day | ORAL | 2 refills | Status: DC

## 2017-09-16 MED ORDER — ORPHENADRINE CITRATE ER 100 MG PO TB12: 100.0000 mg | ORAL_TABLET | Freq: Two times a day (BID) | ORAL | 1 refills | Status: DC

## 2017-09-16 NOTE — Progress Notes (Signed)
Nursing Pain Medication Assessment:  Safety precautions to be maintained throughout the outpatient stay will include: orient to surroundings, keep bed in low position, maintain call bell within reach at all times, provide assistance with transfer out of bed and ambulation.  Medication Inspection Compliance: Pill count conducted under aseptic conditions, in front of the patient. Neither the pills nor the bottle was removed from the patient's sight at any time. Once count was completed pills were immediately returned to the patient in their original bottle.  Medication: Hydrocodone/APAP Pill/Patch Count: 31 of 90 pills remain Pill/Patch Appearance: Markings consistent with prescribed medication Bottle Appearance: Standard pharmacy container. Clearly labeled. Filled Date: 03 / 19 / 2019 Last Medication intake:  Today

## 2017-09-16 NOTE — Patient Instructions (Addendum)
____________________________________________________________________________________________  Medication Rules  Applies to: All patients receiving prescriptions (written or electronic).  Pharmacy of record: Pharmacy where electronic prescriptions will be sent. If written prescriptions are taken to a different pharmacy, please inform the nursing staff. The pharmacy listed in the electronic medical record should be the one where you would like electronic prescriptions to be sent.  Prescription refills: Only during scheduled appointments. Applies to both, written and electronic prescriptions.  NOTE: The following applies primarily to controlled substances (Opioid* Pain Medications).   Patient's responsibilities: 1. Pain Pills: Bring all pain pills to every appointment (except for procedure appointments). 2. Pill Bottles: Bring pills in original pharmacy bottle. Always bring newest bottle. Bring bottle, even if empty. 3. Medication refills: You are responsible for knowing and keeping track of what medications you need refilled. The day before your appointment, write a list of all prescriptions that need to be refilled. Bring that list to your appointment and give it to the admitting nurse. Prescriptions will be written only during appointments. If you forget a medication, it will not be "Called in", "Faxed", or "electronically sent". You will need to get another appointment to get these prescribed. 4. Prescription Accuracy: You are responsible for carefully inspecting your prescriptions before leaving our office. Have the discharge nurse carefully go over each prescription with you, before taking them home. Make sure that your name is accurately spelled, that your address is correct. Check the name and dose of your medication to make sure it is accurate. Check the number of pills, and the written instructions to make sure they are clear and accurate. Make sure that you are given enough medication to last  until your next medication refill appointment. 5. Taking Medication: Take medication as prescribed. Never take more pills than instructed. Never take medication more frequently than prescribed. Taking less pills or less frequently is permitted and encouraged, when it comes to controlled substances (written prescriptions).  6. Inform other Doctors: Always inform, all of your healthcare providers, of all the medications you take. 7. Pain Medication from other Providers: You are not allowed to accept any additional pain medication from any other Doctor or Healthcare provider. There are two exceptions to this rule. (see below) In the event that you require additional pain medication, you are responsible for notifying us, as stated below. 8. Medication Agreement: You are responsible for carefully reading and following our Medication Agreement. This must be signed before receiving any prescriptions from our practice. Safely store a copy of your signed Agreement. Violations to the Agreement will result in no further prescriptions. (Additional copies of our Medication Agreement are available upon request.) 9. Laws, Rules, & Regulations: All patients are expected to follow all Federal and State Laws, Statutes, Rules, & Regulations. Ignorance of the Laws does not constitute a valid excuse. The use of any illegal substances is prohibited. 10. Adopted CDC guidelines & recommendations: Target dosing levels will be at or below 60 MME/day. Use of benzodiazepines** is not recommended.  Exceptions: There are only two exceptions to the rule of not receiving pain medications from other Healthcare Providers. 1. Exception #1 (Emergencies): In the event of an emergency (i.e.: accident requiring emergency care), you are allowed to receive additional pain medication. However, you are responsible for: As soon as you are able, call our office [phone], at any time of the day or night, and leave a message stating your name, the  date and nature of the emergency, and the name and dose of the medication   prescribed. In the event that your call is answered by a member of our staff, make sure to document and save the date, time, and the name of the person that took your information.  2. Exception #2 (Planned Surgery): In the event that you are scheduled by another doctor or dentist to have any type of surgery or procedure, you are allowed (for a period no longer than 30 days), to receive additional pain medication, for the acute post-op pain. However, in this case, you are responsible for picking up a copy of our "Post-op Pain Management for Surgeons" handout, and giving it to your surgeon or dentist. This document is available at our office, and does not require an appointment to obtain it. Simply go to our office during business hours (Monday-Thursday from 8:00 AM to 4:00 PM) (Friday 8:00 AM to 12:00 Noon) or if you have a scheduled appointment with [place], prior to your surgery, and ask for it by name. In addition, you will need to provide [place] with your name, name of your surgeon, type of surgery, and date of procedure or surgery.  *Opioid medications include: morphine, codeine, oxycodone, oxymorphone, hydrocodone, hydromorphone, meperidine, tramadol, tapentadol, buprenorphine, fentanyl, methadone. **Benzodiazepine medications include: diazepam (Valium), alprazolam (Xanax), clonazepam (Klonopine), lorazepam (Ativan), clorazepate (Tranxene), chlordiazepoxide (Librium), estazolam (Prosom), oxazepam (Serax), temazepam (Restoril), triazolam (Halcion) (Last updated: 07/24/2017) ____________________________________________________________________________________________   Prescription given for hydrocodone 5-325  x2 to last until 10-26-2017.

## 2017-09-16 NOTE — Progress Notes (Signed)
Patient's Name: [name]  MRN: [id number]  Referring Provider: Wenda Low, MD  DOB: 12-Dec-1940  PCP: Wenda Low, MD  DOS: 09/16/2017  Note by: Vevelyn Francois NP  Service setting: Ambulatory outpatient  Specialty: Interventional Pain Management  Location: [facility] (AMB) Pain Management Facility    Patient type: Established    Primary Reason(s) for Visit: Encounter for prescription drug management. (Level of risk: moderate)  CC: Hip Pain (right) and Back Pain (lower)  HPI  [name] is a 77 y.o. year old, female patient, who comes today for a medication management evaluation. 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[place]); Hypertensive kidney disease, malignant; Chronic low back pain (Tertiary Area of Pain) (Bilateral) (L>R); CAFL (chronic airflow limitation) ([place]); Apoplectic vertigo; Duodenogastric reflux; History of stroke; Hoarseness; Benign essential HTN; Chronic knee pain (Primary Area of Pain) (Right); Depression, major, in partial remission ([place]); Major depression in remission ([place]); Involutional osteoporosis; Pharyngoesophageal dysphagia; 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); Hx of total knee replacement (Bilateral); Infection of the upper respiratory tract; Bed wetting; UTI (urinary tract infection); Long term current use of opiate analgesic; Long term prescription opiate use; Chronic pain syndrome; Chronic neck pain (Secondary Area of Pain) (Bilateral) (L>R); Chronic hip pain (Right); Rib pain; DDD (degenerative disc disease), cervical; Lumbar facet arthropathy (Bilateral);  Chronic myofascial pain; Lumbar facet syndrome (Bilateral) (L>R); Long term prescription benzodiazepine use; Opiate use; Disorder of skeletal system; Pharmacologic therapy; Problems influencing health status; Opioid-induced constipation (OIC); NSAID long-term use; DDD (degenerative disc disease), lumbar; Chronic knee pain (Bilateral) (R>L); Osteoarthritis; Chronic musculoskeletal pain; Neurogenic pain; Chronic knee pain s/p total knee replacement (TKR) (Right); CKD (chronic kidney disease) stage 3, GFR 30-59 ml/min ([place]); Chronic shoulder pain (Left); Non-traumatic compression fracture of T3 thoracic vertebra, sequela; High risk medication use; At high risk for falls; Chronic sacroiliac joint pain (Right); Spondylosis without myelopathy or radiculopathy, lumbosacral region; Other specified dorsopathies, sacral and sacrococcygeal region; Abnormal x-ray of pelvis; SBO (small bowel obstruction) ([place]); History of small bowel obstruction; and 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 Her primarily concern today is the Hip Pain (right) and Back Pain (lower)  Pain Assessment: Location: Right Hip Radiating: does not move Onset: More than a month ago Duration: Chronic pain Quality: Constant, Throbbing Severity: 5 /10 (self-reported pain score)  Note: Reported level is compatible with observation.                          Effect on ADL: pt needs to sit all the time Timing: Constant Modifying factors: sitting, reclining  [name] was last scheduled for an appointment on 08/12/2017 for medication management. During today's appointment we reviewed [name]'s chronic pain status, as well as her outpatient medication regimen. She is going to have right hip surgery on May 7. She admits that she did not heal from her previous surgery. She admits that the Norco is not effective at all in treating her pain. She feels like she is not using anything. Her daughter is reqesting that she gets  something else for pain. Her daughter is also wanting her to have an repeat of the Lumbar Facet NB.  The patient  reports that she does not use drugs. Her body mass index is 31.93 kg/m.  Further details on both, my assessment(s), as well as the proposed treatment plan, please see below.  Controlled Substance Pharmacotherapy Assessment REMS (Risk Evaluation and Mitigation Strategy)  Analgesic: Oxycodone IR 5 mg 1 tablet by mouth every 8 hours (47m/dayoxycodone) (22.5MME/day) MME/day:22.575mday. WeRise PatienceRN  09/16/2017  1:05 PM  Sign at close encounter Nursing Pain Medication Assessment:  Safety precautions to be maintained throughout the outpatient stay will include: orient to surroundings, keep bed in low position, maintain call bell within reach at all times, provide assistance with transfer out of bed and ambulation.  Medication Inspection Compliance: Pill count conducted under aseptic conditions, in front of the patient. Neither the pills nor the bottle was removed from the patient's sight at any time. Once count was completed pills were immediately returned to the patient in their original bottle.  Medication: Hydrocodone/APAP Pill/Patch Count: 31 of 90 pills remain Pill/Patch Appearance: Markings consistent with prescribed medication Bottle Appearance: Standard pharmacy container. Clearly labeled. Filled Date: 03 / 19 / 2019 Last Medication intake:  Today   Pharmacokinetics: Liberation and absorption (onset of action): WNL Distribution (time to peak effect): WNL Metabolism and excretion (duration of action): WNL         Pharmacodynamics: Desired effects: Analgesia: [name] >50% benefit. Functional ability: Patient reports that medication allows her to accomplish basic ADLs Clinically meaningful improvement in function (CMIF): Sustained CMIF goals met Perceived effectiveness: Described as relatively effective, allowing for increase in activities of daily  living (ADL) Undesirable effects: Side-effects or Adverse reactions: None reported Monitoring: [place] PMP: Online review of the past 1272-monthriod conducted. Compliant with practice rules and regulations Last UDS on record: Summary  Date Value Ref Range Status  08/12/2017 FINAL  Final    Comment:    ==================================================================== TOXASSURE SELECT 13 (MW) ==================================================================== Test                             Result       Flag       Units Drug Present and Declared for Prescription Verification   Alprazolam                     132          EXPECTED   ng/mg creat   Alpha-hydroxyalprazolam        139          EXPECTED   ng/mg creat    Source of alprazolam is a scheduled prescription medication.    Alpha-hydroxyalprazolam is an expected metabolite of alprazolam. Drug Absent but Declared for Prescription Verification   Hydrocodone                    Not Detected UNEXPECTED ng/mg creat ==================================================================== Test                      Result    Flag   Units      Ref Range   Creatinine              148              mg/dL      >=20 ==================================================================== Declared Medications:  The flagging and interpretation on this report are based on the  following declared medications.  Unexpected results may arise from  inaccuracies in the declared medications.  **  Note: The testing scope of this panel includes these medications:  Alprazolam (Xanax)  Hydrocodone (Norco)  **Note: The testing scope of this panel does not include following  reported medications:  Acetaminophen (Norco)  Albuterol  Alendronate (Fosamax)  Amlodipine (Norvasc)  Baclofen (Lioresal)  Calcium  Citalopram (Celexa)  Cyanocobalamin  Diclofenac (Voltaren)  Docusate (Colace)  Docusate (Senokot-S)  Ferrous Gluconate  Gabapentin  Linaclotide (Linzess)   Lovastatin (Mevacor)  Meloxicam (Mobic)  Menthol  Multivitamin  Omeprazole (Nexium)  Ondansetron (Zofran)  Orphenadrine (Norflex)  Polyethylene Glycol  Promethazine  Sennosides (Senokot-S)  Supplement  Supplement (Krill Oil)  Trazodone (Desyrel) ==================================================================== For clinical consultation, please call [phone]. ====================================================================    UDS interpretation: Compliant In this case, absence of medication may be secondary to sample timing with relation to PRN intake. Medication Assessment Form: Reviewed. Patient indicates being compliant with therapy Treatment compliance: Compliant Risk Assessment Profile: Aberrant behavior: See prior evaluations. None observed or detected today Comorbid factors increasing risk of overdose: See prior notes. No additional risks detected today Risk of substance use disorder (SUD): Low Opioid Risk Tool - 09/16/17 1306      Family History of Substance Abuse   Alcohol  Positive Female    Illegal Drugs  Negative    Rx Drugs  Negative      Personal History of Substance Abuse   Alcohol  Negative    Illegal Drugs  Negative    Rx Drugs  Negative      Age   Age between 83-45 years   No      History of Preadolescent Sexual Abuse   History of Preadolescent Sexual Abuse  Negative or Female      Psychological Disease   Psychological Disease  Negative    ADD  Negative    OCD  Negative    Bipolar  Negative    Schizophrenia  Negative    Depression  Negative      Total Score   Opioid Risk Tool Scoring  1    Opioid Risk Interpretation  Low Risk      ORT Scoring interpretation table:  Score <3 = Low Risk for SUD  Score between 4-7 = Moderate Risk for SUD  Score >8 = High Risk for Opioid Abuse   Risk Mitigation Strategies:  Patient Counseling: Covered Patient-Prescriber Agreement (PPA): Present and active  Notification to other healthcare  providers: Done  Pharmacologic Plan: No change in therapy, at this time.             Laboratory Chemistry  Inflammation Markers (CRP: Acute Phase) (ESR: Chronic Phase) Lab Results  Component Value Date   CRP <0.8 11/19/2016   ESRSEDRATE 20 11/19/2016   LATICACIDVEN 0.98 10/07/2014                         Rheumatology Markers No results found for: RF, ANA, LABURIC, URICUR, LYMEIGGIGMAB, [facility]                      Renal Function Markers Lab Results  Component Value Date   BUN <5 (L) 08/25/2017   CREATININE 0.83 08/25/2017   GFRAA >60 08/25/2017   GFRNONAA >60 08/25/2017                              Hepatic Function Markers Lab Results  Component Value Date   AST 45 (H) 08/24/2017   ALT 44  08/24/2017   ALBUMIN 3.9 08/24/2017   ALKPHOS 105 08/24/2017   AMYLASE 68 01/25/2007   LIPASE 22 08/24/2017   AMMONIA 66 12/04/2016                        Electrolytes Lab Results  Component Value Date   NA 141 08/25/2017   K 3.6 08/25/2017   CL 108 08/25/2017   CALCIUM 9.1 08/25/2017   MG 2.0 11/19/2016                        Neuropathy Markers Lab Results  Component Value Date   VITAMINB12 1,888 (H) 11/19/2016   HGBA1C 5.6 05/22/2015                        Bone Pathology Markers Lab Results  Component Value Date   25OHVITD1 37 11/19/2016   25OHVITD2 <1.0 11/19/2016   25OHVITD3 37 11/19/2016                         Coagulation Parameters Lab Results  Component Value Date   INR 1.00 09/13/2016   LABPROT 13.2 09/13/2016   APTT 34 01/04/2011   PLT 194 08/25/2017   DDIMER 1.71 (H) 10/23/2016                        Cardiovascular Markers Lab Results  Component Value Date   BNP 60.2 01/23/2016   TROPONINI <0.03 05/23/2015   HGB 14.4 08/25/2017   HCT 45.8 08/25/2017                         CA Markers No results found for: CEA, CA125, LABCA2                      Note: Lab results reviewed.  Recent Diagnostic Imaging Results  DG Abd Portable  1V-Small Bowel Obstruction Protocol-initial, 8 hr delay CLINICAL DATA:  8 hour delay for small bowel obstruction.  EXAM: PORTABLE ABDOMEN - 1 VIEW  COMPARISON:  CT from 03/31  FINDINGS: Oral contrast is noted within the colon from cecum to rectum. No significant small bowel dilatation. Gastric tube is in the expected location of the stomach. Rim-like calcifications are noted in the upper abdomen bilaterally compatible with documented renal arterial aneurysms. No radio-opaque calculi. Sclerosis of the right sacral ala compatible with CT documented sacral insufficiency fracture.  IMPRESSION: 1. No evidence of small bowel obstruction. Enteric contrast is noted within the colon. 2. Sclerosis of the right sacral ala compatible with right sacral ale are insufficiency fracture. 3. Rim-like calcifications along the expected location of the renal hila bilaterally compatible with known renal artery aneurysms.  Electronically Signed   By: Ashley Royalty M.D.   On: 08/25/2017 03:51  Complexity Note: Imaging results reviewed. Results shared with [name], using Layman's terms.                         Meds   Current Outpatient Medications:  .  acetaminophen (TYLENOL) 500 MG tablet, Take 500 mg by mouth every 6 (six) hours as needed for headache (pain)., Disp: , Rfl:  .  albuterol (PROVENTIL HFA;VENTOLIN HFA) 108 (90 BASE) MCG/ACT inhaler, Inhale 2 puffs into the lungs every 6 (six) hours as needed for wheezing or shortness of breath. ,  Disp: , Rfl:  .  albuterol (PROVENTIL) (2.5 MG/3ML) 0.083% nebulizer solution, Take 2.5 mg by nebulization every 6 (six) hours as needed for wheezing or shortness of breath., Disp: , Rfl:  .  alendronate (FOSAMAX) 70 MG tablet, Take 70 mg by mouth every Monday. Take with a full glass of water on an empty stomach., Disp: , Rfl:  .  ALPRAZolam (XANAX) 1 MG tablet, Take 1 mg by mouth at bedtime. , Disp: , Rfl:  .  amLODipine (NORVASC) 5 MG tablet, Take 5 mg by  mouth daily. , Disp: , Rfl:  .  Calcium-Phosphorus-Vitamin D (CALCIUM/D3 ADULT GUMMIES PO), Take 1 tablet by mouth daily., Disp: , Rfl:  .  citalopram (CELEXA) 20 MG tablet, Take 20 mg by mouth daily. , Disp: , Rfl:  .  docusate sodium (COLACE) 100 MG capsule, Take 200-300 mg by mouth 2 (two) times daily. , Disp: , Rfl:  .  esomeprazole (NEXIUM) 40 MG capsule, Take 40 mg by mouth 2 (two) times daily before a meal. , Disp: , Rfl:  .  gabapentin (NEURONTIN) 400 MG capsule, Take 1 capsule (400 mg total) by mouth at bedtime., Disp: 90 capsule, Rfl: 1 .  lactose free nutrition (BOOST) LIQD, Take 237 mLs by mouth daily., Disp: , Rfl:  .  linaclotide (LINZESS) 290 MCG CAPS capsule, Take 290 mcg by mouth daily., Disp: , Rfl:  .  lovastatin (MEVACOR) 10 MG tablet, Take 10 mg by mouth daily., Disp: , Rfl:  .  meloxicam (MOBIC) 15 MG tablet, Take 15 mg by mouth daily. , Disp: , Rfl:  .  Menthol, Topical Analgesic, (BIOFREEZE EX), Apply 1 application topically daily as needed (muscle pain)., Disp: , Rfl:  .  Multiple Vitamins-Minerals (MULTIVITAMIN GUMMIES ADULT PO), Take 1 tablet by mouth daily., Disp: , Rfl:  .  ondansetron (ZOFRAN-ODT) 8 MG disintegrating tablet, Take 8 mg by mouth every 8 (eight) hours as needed for nausea or vomiting., Disp: , Rfl:  .  orphenadrine (NORFLEX) 100 MG tablet, Take 1 tablet (100 mg total) by mouth 2 (two) times daily., Disp: 60 tablet, Rfl: 1 .  polyethylene glycol (MIRALAX / GLYCOLAX) packet, Take 17 g by mouth daily., Disp: , Rfl:  .  senna (SENOKOT) 8.6 MG TABS tablet, Take 2 tablets by mouth 2 (two) times daily. constipation , Disp: , Rfl:  .  traZODone (DESYREL) 100 MG tablet, Take 100 mg by mouth at bedtime. , Disp: , Rfl:  .  [START ON 09/26/2017] HYDROcodone-acetaminophen (NORCO/VICODIN) 5-325 MG tablet, Take 1 tablet by mouth every 8 (eight) hours., Disp: 90 tablet, Rfl: 0 .  [START ON 10/26/2017] HYDROcodone-acetaminophen (NORCO/VICODIN) 5-325 MG tablet, Take 1 tablet  by mouth 3 (three) times daily., Disp: 90 tablet, Rfl: 0  ROS  Constitutional: Denies any fever or chills Gastrointestinal: No reported hemesis, hematochezia, vomiting, or acute GI distress Musculoskeletal: Denies any acute onset joint swelling, redness, loss of ROM, or weakness Neurological: No reported episodes of acute onset apraxia, aphasia, dysarthria, agnosia, amnesia, paralysis, loss of coordination, or loss of consciousness  Allergies  [name] is allergic to ampicillin; ampicillin-sulbactam sodium; toradol [ketorolac tromethamine]; tramadol; flexeril [cyclobenzaprine]; ambien [zolpidem tartrate]; cephalexin; percocet [oxycodone-acetaminophen]; sulfamethoxazole-trimethoprim; and tape.  [place]  Drug: [name]  reports that she does not use drugs. Alcohol:  reports that she does not drink alcohol. Tobacco:  reports that she has quit smoking. She has a 45.00 pack-year smoking history. She uses smokeless tobacco. Medical:  has a past medical history  of Anemia, B12 deficiency, Bacteremia (10/07/2014), Bladder incontinence, Blind left eye, Cataract, Cervical spine fracture ([place]), Chest pain (07/29/2013), Compression fracture, COPD (chronic obstructive pulmonary disease) ([place]), DDD (degenerative disc disease), lumbar, Depression, Diffuse myofascial pain syndrome (03/24/2015), Fever (10/05/2014), GERD (gastroesophageal reflux disease), Hiatal hernia, Hyperlipidemia, Hypertension, Hypertensive kidney disease, malignant (11/07/2016), Macular degeneration, Osteoarthritis, Osteoporosis, Reactive airway disease, Rupture of bowel ([place]), Sepsis ([place]) (10/08/2014), Stroke [facility]), Stroke ([place]), and Wrist pain, acute (09/10/2012). Surgical: [name]  has a past surgical history that includes Abdominal surgery; Colon surgery; Joint replacement; Appendectomy; Bladder surgery; Rectocele repair; Knee surgery; Cholecystectomy; Abdominal hysterectomy; Ostomy; Eye surgery; Intramedullary (im) nail intertrochanteric  (Right, 09/13/2016); and Hip surgery (Right, 08/2016). Family: family history includes Heart attack in her father; Heart failure in her mother.  Constitutional Exam  General appearance: Well nourished, well developed, and well hydrated. In no apparent acute distress Vitals:   09/16/17 1253  BP: (!) 140/108  Pulse: (!) 110  Resp: 16  Temp: 98.2 F (36.8 C)  TempSrc: Oral  SpO2: 93%  Weight: 186 lb (84.4 kg)  Height: '5\' 4"'  (1.626 m)   BMI Assessment: Estimated body mass index is 31.93 kg/m as calculated from the following:   Height as of this encounter: '5\' 4"'  (1.626 m).   Weight as of this encounter: 186 lb (84.4 kg). Psych/Mental status: Alert, oriented x 3 (person, place, & time)       Eyes: PERLA Respiratory: No evidence of acute respiratory distress   Lumbar Spine Area Exam  Skin & Axial Inspection: No masses, redness, or swelling Alignment: Symmetrical Functional ROM: Unrestricted ROM       Stability: No instability detected Muscle Tone/Strength: Functionally intact. No obvious neuro-muscular anomalies detected. Sensory (Neurological): Unimpaired Palpation: No palpable anomalies       Provocative Tests: Lumbar Hyperextension and rotation test: evaluation deferred today       Lumbar Lateral bending test: evaluation deferred today       Patrick's Maneuver: evaluation deferred today                    Gait & Posture Assessment  Ambulation: Patient came in today in a wheel chair Gait: Antalgic Posture: WNL   Lower Extremity Exam    Side: Right lower extremity  Side: Left lower extremity  Skin & Extremity Inspection: Evidence of prior arthroplastic surgery  Skin & Extremity Inspection: Skin color, temperature, and hair growth are WNL. No peripheral edema or cyanosis. No masses, redness, swelling, asymmetry, or associated skin lesions. No contractures.  Functional ROM: Unrestricted ROM          Functional ROM: Unrestricted ROM          Muscle Tone/Strength: Functionally  intact. No obvious neuro-muscular anomalies detected.  Muscle Tone/Strength: Functionally intact. No obvious neuro-muscular anomalies detected.  Sensory (Neurological): Unimpaired  Sensory (Neurological): Unimpaired  Palpation: No palpable anomalies  Palpation: No palpable anomalies   Assessment  Primary Diagnosis & Pertinent Problem List: The primary encounter diagnosis was Chronic hip pain (Right). Diagnoses of Spondylosis without myelopathy or radiculopathy, lumbosacral region, Chronic sacroiliac joint pain (Right), Chronic musculoskeletal pain, Chronic pain syndrome, and Neurogenic pain were also pertinent to this visit.  Status Diagnosis  Controlled Controlled Controlled 1. Chronic hip pain (Right)   2. Spondylosis without myelopathy or radiculopathy, lumbosacral region   3. Chronic sacroiliac joint pain (Right)   4. Chronic musculoskeletal pain   5. Chronic pain syndrome   6. Neurogenic pain     Problems updated  and reviewed during this visit: No problems updated. Plan of Care  Pharmacotherapy (Medications Ordered): Meds ordered this encounter  Medications  . DISCONTD: baclofen (LIORESAL) 10 MG tablet    Sig: Take 1 tablet (10 mg total) by mouth daily.    Dispense:  30 tablet    Refill:  2    Order Specific Question:   Supervising Provider    Answer:   Milinda Pointer [phone]  . HYDROcodone-acetaminophen (NORCO/VICODIN) 5-325 MG tablet    Sig: Take 1 tablet by mouth every 8 (eight) hours.    Dispense:  90 tablet    Refill:  0    Do not place this medication, or any other prescription from our practice, on "Automatic Refill". Patient may have prescription filled one day early if pharmacy is closed on scheduled refill date. Do not fill until: 09/26/2017 To last until:10/26/2017    Order Specific Question:   Supervising Provider    Answer:   Milinda Pointer [phone]  . gabapentin (NEURONTIN) 400 MG capsule    Sig: Take 1 capsule (400 mg total) by mouth at bedtime.     Dispense:  90 capsule    Refill:  1    Do not place medication on "Automatic Refill". Fill one day early if pharmacy is closed on scheduled refill date.    Order Specific Question:   Supervising Provider    Answer:   Milinda Pointer [phone]  . HYDROcodone-acetaminophen (NORCO/VICODIN) 5-325 MG tablet    Sig: Take 1 tablet by mouth 3 (three) times daily.    Dispense:  90 tablet    Refill:  0    Do not place this medication, or any other prescription from our practice, on "Automatic Refill". Patient may have prescription filled one day early if pharmacy is closed on scheduled refill date. Do not fill until:  To last until:    Order Specific Question:   Supervising Provider    Answer:   Milinda Pointer [phone]  . orphenadrine (NORFLEX) 100 MG tablet    Sig: Take 1 tablet (100 mg total) by mouth 2 (two) times daily.    Dispense:  60 tablet    Refill:  1    Do not place this medication, or any other prescription from our practice, on "Automatic Refill". Patient may have prescription filled one day early if pharmacy is closed on scheduled refill date.    Order Specific Question:   Supervising Provider    Answer:   Milinda Pointer [[id number]]   New Prescriptions   HYDROCODONE-ACETAMINOPHEN (NORCO/VICODIN) 5-325 MG TABLET    Take 1 tablet by mouth 3 (three) times daily.   Medications administered today: Lizbett L. Roddy had no medications administered during this visit. Lab-work, procedure(s), and/or referral(s): No orders of the defined types were placed in this encounter.  Imaging and/or referral(s): None  Interventional management options: Planned, scheduled, and/or pending: Not at this time. Will hold off secondary to right hip surgery in 2 weeks. No steriods 2 weeks prior to or after surgery. Verbalized understanding   Considering: Diagnostic right-sided intra-articular knee joint injection Possible series of 5 right-sided intra-articular Hyalgan knee  injections Diagnostic right-sided genicular nerve block Possible right-sided genicular nerve RFA Diagnostic left cervical epidural steroid injection Diagnostic bilateral cervical facet blocks Diagnostic bilateral intra-articular shoulder joint injection Diagnostic bilateral suprascapular nerve block Possible bilateral suprascapular nerve RFA Diagnostic bilateral lumbar facet block Possible bilateral lumbar facet RFA   Palliative PRN treatment(s): None at this time      Provider-requested  follow-up: Return in about 2 months (around 11/16/2017) for MedMgmt with Me Donella Stade Edison Pace).  Future Appointments  Date Time Provider [place]  09/19/2017  1:00 PM [facility] PAT 2 [facility] None  11/24/2017 11:15 AM Vevelyn Francois, NP [facility] None   Primary Care Physician: Wenda Low, MD Location: [facility] Outpatient [facility] Management Facility Note by: Vevelyn Francois NP Date: 09/16/2017; Time: 8:41 AM  Pain Score Disclaimer: We use the NRS-11 scale. This is a self-reported, subjective measurement of pain severity with only modest accuracy. It is used primarily to identify changes within a particular patient. It must be understood that outpatient pain scales are significantly less accurate that those used for research, where they can be applied under ideal controlled circumstances with minimal exposure to variables. In reality, the score is likely to be a combination of pain intensity and pain affect, where pain affect describes the degree of emotional arousal or changes in action readiness caused by the sensory experience of pain. Factors such as social and work situation, setting, emotional state, anxiety levels, expectation, and prior pain experience may influence pain perception and show large inter-individual differences that may also be affected by time variables.  Patient instructions provided during this appointment: Patient Instructions   ____________________________________________________________________________________________  Medication Rules  Applies to: All patients receiving prescriptions (written or electronic).  Pharmacy of record: Pharmacy where electronic prescriptions will be sent. If written prescriptions are taken to a different pharmacy, please inform the nursing staff. The pharmacy listed in the electronic medical record should be the one where you would like electronic prescriptions to be sent.  Prescription refills: Only during scheduled appointments. Applies to both, written and electronic prescriptions.  NOTE: The following applies primarily to controlled substances (Opioid* Pain Medications).   Patient's responsibilities: 1. Pain Pills: Bring all pain pills to every appointment (except for procedure appointments). 2. Pill Bottles: Bring pills in original pharmacy bottle. Always bring newest bottle. Bring bottle, even if empty. 3. Medication refills: You are responsible for knowing and keeping track of what medications you need refilled. The day before your appointment, write a list of all prescriptions that need to be refilled. Bring that list to your appointment and give it to the admitting nurse. Prescriptions will be written only during appointments. If you forget a medication, it will not be "Called in", "Faxed", or "electronically sent". You will need to get another appointment to get these prescribed. 4. Prescription Accuracy: You are responsible for carefully inspecting your prescriptions before leaving our office. Have the discharge nurse carefully go over each prescription with you, before taking them home. Make sure that your name is accurately spelled, that your address is correct. Check the name and dose of your medication to make sure it is accurate. Check the number of pills, and the written instructions to make sure they are clear and accurate. Make sure that you are given enough medication to  last until your next medication refill appointment. 5. Taking Medication: Take medication as prescribed. Never take more pills than instructed. Never take medication more frequently than prescribed. Taking less pills or less frequently is permitted and encouraged, when it comes to controlled substances (written prescriptions).  6. Inform other Doctors: Always inform, all of your healthcare providers, of all the medications you take. 7. Pain Medication from other Providers: You are not allowed to accept any additional pain medication from any other Doctor or Healthcare provider. There are two exceptions to this rule. (see below) In the event that you require  additional pain medication, you are responsible for notifying us, as stated below. 8. Medication Agreement: You are responsible for carefully reading and following our Medication Agreement. This must be signed before receiving any prescriptions from our practice. Safely store a copy of your signed Agreement. Violations to the Agreement will result in no further prescriptions. (Additional copies of our Medication Agreement are available upon request.) 9. Laws, Rules, & Regulations: All patients are expected to follow all Federal and [employer], [employer], Rules, [employer]. Ignorance of the Laws does not constitute a valid excuse. The use of any illegal substances is prohibited. 10. Adopted CDC guidelines & recommendations: Target dosing levels will be at or below 60 MME/day. Use of benzodiazepines** is not recommended.  Exceptions: There are only two exceptions to the rule of not receiving pain medications from other Healthcare Providers. 1. Exception #1 (Emergencies): In the event of an emergency (i.e.: accident requiring emergency care), you are allowed to receive additional pain medication. However, you are responsible for: As soon as you are able, call our office [phone], at any time of the day or night, and leave a message stating your  name, the date and nature of the emergency, and the name and dose of the medication prescribed. In the event that your call is answered by a member of our staff, make sure to document and save the date, time, and the name of the person that took your information.  2. Exception #2 (Planned Surgery): In the event that you are scheduled by another doctor or dentist to have any type of surgery or procedure, you are allowed (for a period no longer than 30 days), to receive additional pain medication, for the acute post-op pain. However, in this case, you are responsible for picking up a copy of our "Post-op Pain Management for Surgeons" handout, and giving it to your surgeon or dentist. This document is available at our office, and does not require an appointment to obtain it. Simply go to our office during business hours (Monday-Thursday from 8:00 AM to 4:00 PM) (Friday 8:00 AM to 12:00 Noon) or if you have a scheduled appointment with [place], prior to your surgery, and ask for it by name. In addition, you will need to provide [place] with your name, name of your surgeon, type of surgery, and date of procedure or surgery.  *Opioid medications include: morphine, codeine, oxycodone, oxymorphone, hydrocodone, hydromorphone, meperidine, tramadol, tapentadol, buprenorphine, fentanyl, methadone. **Benzodiazepine medications include: diazepam (Valium), alprazolam (Xanax), clonazepam (Klonopine), lorazepam (Ativan), clorazepate (Tranxene), chlordiazepoxide (Librium), estazolam (Prosom), oxazepam (Serax), temazepam (Restoril), triazolam (Halcion) (Last updated: 07/24/2017) ____________________________________________________________________________________________   Prescription given for hydrocodone 5-325  x2 to last until 10-26-2017.

## 2017-09-18 NOTE — Pre-Procedure Instructions (Signed)
[name]  09/18/2017      [facility] - [place], [place] - [address], [address], [address] [place] [address] Phone: [phone] Fax: [phone]  [id number] - [place], [place] [place] [address] Phone: [phone] Fax: [phone]    Your procedure is scheduled on May 7  Report to [place] at 1030 A.M.  Call this number if you have problems the morning of surgery:  [phone]   Remember:  Do not eat food or drink liquids after midnight.  Continue all medications as directed by your physician except follow these medication instructions before surgery below    Take these medicines the morning of surgery with A SIP OF WATER  acetaminophen (TYLENOL) albuterol (ACCUNEB) albuterol (PROVENTIL HFA;VENTOLIN HFA)  amLODipine (NORVASC) citalopram (CELEXA) esomeprazole (NEXIUM)  gabapentin (NEURONTIN)   HYDROcodone-acetaminophen (NORCO/VICODIN) linaclotide (LINZESS)    7 days prior to surgery STOP taking any Aspirin(unless otherwise instructed by your surgeon), Aleve, Naproxen, Ibuprofen, Motrin, Advil, Goody's, BC's, all herbal medications, fish oil, and all vitamins, MOBIC   Do not wear jewelry, make-up or nail polish.  Do not wear lotions, powders, or perfumes, or deodorant.  Do not shave 48 hours prior to surgery.    Do not bring valuables to the hospital.  [facility] is not responsible for any belongings or valuables.  Contacts, dentures or bridgework may not be worn into surgery.  Leave your suitcase in the car.  After surgery it may be brought to your room.  For patients admitted to the hospital, discharge time will be determined by your treatment team.  Patients discharged the day of surgery will not be allowed to drive home.    Special instructions:   [facility]- Preparing For Surgery  Before surgery, you can play an important role. Because  skin is not sterile, your skin needs to be as free of germs as possible. You can reduce the number of germs on your skin by washing with CHG (chlorahexidine gluconate) Soap before surgery.  CHG is an antiseptic cleaner which kills germs and bonds with the skin to continue killing germs even after washing.  Please do not use if you have an allergy to CHG or antibacterial soaps. If your skin becomes reddened/irritated stop using the CHG.  Do not shave (including legs and underarms) for at least 48 hours prior to first CHG shower. It is OK to shave your face.  Please follow these instructions carefully.   1. Shower the NIGHT BEFORE SURGERY and the MORNING OF SURGERY with CHG.   2. If you chose to wash your hair, wash your hair first as usual with your normal shampoo.  3. After you shampoo, rinse your hair and body thoroughly to remove the shampoo.  4. Use CHG as you would any other liquid soap. You can apply CHG directly to the skin and wash gently with a scrungie or a clean washcloth.   5. Apply the CHG Soap to your body ONLY FROM THE NECK DOWN.  Do not use on open wounds or open sores. Avoid contact with your eyes, ears, mouth and genitals (private parts). Wash Face and genitals (private parts)  with your normal soap.  6. Wash thoroughly, paying special attention to the area where your surgery will be performed.  7. Thoroughly rinse your body with warm water from the neck down.  8. DO NOT shower/wash with your normal soap  after using and rinsing off the CHG Soap.  9. Pat yourself dry with a CLEAN TOWEL.  10. Wear CLEAN PAJAMAS to bed the night before surgery, wear comfortable clothes the morning of surgery  11. Place CLEAN SHEETS on your bed the night of your first shower and DO NOT SLEEP WITH PETS.    Day of Surgery: Do not apply any deodorants/lotions. Please wear clean clothes to the hospital/surgery center.      Please read over the following fact sheets that you were  given.

## 2017-09-19 ENCOUNTER — Encounter (HOSPITAL_COMMUNITY)
Admission: RE | Admit: 2017-09-19 | Discharge: 2017-09-19 | Disposition: A | Discharge status: Home / Self Care | Payer: Medicare Other | Source: Ambulatory Visit | Attending: Orthopedic Surgery | Admitting: Orthopedic Surgery

## 2017-09-19 ENCOUNTER — Encounter (HOSPITAL_COMMUNITY): Payer: Self-pay

## 2017-09-19 ENCOUNTER — Other Ambulatory Visit: Payer: Self-pay

## 2017-09-19 DIAGNOSIS — Z01812 Encounter for preprocedural laboratory examination: Secondary | ICD-10-CM | POA: Insufficient documentation

## 2017-09-19 HISTORY — DX: Pneumonia, unspecified organism: J18.9

## 2017-09-19 HISTORY — DX: Dyspnea, unspecified: R06.00

## 2017-09-19 HISTORY — DX: Malignant (primary) neoplasm, unspecified: C80.1

## 2017-09-19 LAB — BASIC METABOLIC PANEL
Anion gap: 11 (ref 5–15)
BUN: <5 mg/dL — ABNORMAL LOW (ref 6–20)
CO2: 25 mmol/L (ref 22–32)
Calcium: 9.5 mg/dL (ref 8.9–10.3)
Chloride: 105 mmol/L (ref 101–111)
Creatinine, Ser: 0.93 mg/dL (ref 0.44–1.00)
GFR calc Af Amer: >60 mL/min (ref >60)
GFR calc non Af Amer: 58 mL/min — ABNORMAL LOW (ref >60)
Glucose, Bld: 92 mg/dL (ref 65–99)
Potassium: 3.3 mmol/L — ABNORMAL LOW (ref 3.5–5.1)
Sodium: 141 mmol/L (ref 135–145)

## 2017-09-19 LAB — CBC
HCT: 43.9 % (ref 36.0–46.0)
Hemoglobin: 14 g/dL (ref 12.0–15.0)
MCH: 30.2 pg (ref 26.0–34.0)
MCHC: 31.9 g/dL (ref 30.0–36.0)
MCV: 94.6 fL (ref 78.0–100.0)
Platelets: 191 10*3/uL (ref 150–400)
RBC: 4.64 MIL/uL (ref 3.87–5.11)
RDW: 14.5 % (ref 11.5–15.5)
WBC: 4.9 10*3/uL (ref 4.0–10.5)

## 2017-09-19 LAB — SURGICAL PCR SCREEN
MRSA, PCR: POSITIVE — AB
Staphylococcus aureus: POSITIVE — AB

## 2017-09-19 LAB — HEMOGLOBIN A1C
Hgb A1c MFr Bld: 4.9 % (ref 4.8–5.6)
Mean Plasma Glucose: 93.93 mg/dL

## 2017-09-19 NOTE — Progress Notes (Addendum)
[name] denies chest pain, does get short of breath with activity, "I've been sitting in a chair for about of year due to my back and knee pain." [name] was sen in 2016 with chest pain, she had an Echo and stress test and  Was to follow up with Dr Sondra Come in 3 months, patient has not seen a cardiologist  Since 2016.  Patient's daughter reports that "they think she was having chest pain due to stress, and she has so many Drs, none of them has said anything." Chart sent to be reviewed by anesthesia PA/NP.  I asked patient who her PCP is and she said Dr [name] in [place] or Dr [name].

## 2017-09-19 NOTE — Pre-Procedure Instructions (Signed)
[name]  09/19/2017     Your procedure is scheduled on May 7  Report to [facility] [place] at 1030 A.M.                Your surgery or procedure is scheduled for 12:30 PM   Call this number if you have problems the morning of [phone]- pre- op desk                 For any other questions prior to surgery Monday - Friday, 8:00 AM - 4:00 PM, call [phone]- PAT desk, ask to speak to any nurse.   Remember:  Do not eat food or drink liquids after midnight Monday, May 6..  Continue all medications as directed by your physician except follow these medication instructions before surgery below    Take these medicines the morning of surgery with A SIP OF WATER   amLODipine (NORVASC) citalopram (CELEXA) esomeprazole (NEXIUM)  gabapentin (NEURONTIN)  linaclotide (LINZESS)   If needed:  Tylenol Albuterol nebulizer or Inhaler- bring inhaler to the hospital with you. Zofran Hydrocodone- Acetaminophen  7 days prior to surgery STOP taking any Aspirin(unless otherwise instructed by your surgeon), Aleve, Naproxen, Ibuprofen, Motrin, Advil, Goody's, BC's, all herbal medications, fish oil, and all vitamins, MOBIC   Do not wear jewelry, make-up or nail polish.  Do not wear lotions, powders, or perfumes, or deodorant.  Do not shave 48 hours prior to surgery.    Do not bring valuables to the hospital.  [facility] is not responsible for any belongings or valuables.   Special instructions:   - Preparing For Surgery  Before surgery, you can play an important role. Because skin is not sterile, your skin needs to be as free of germs as possible. You can reduce the number of germs on your skin by washing with CHG (chlorahexidine gluconate) Soap before surgery.  CHG is an antiseptic cleaner which kills germs and bonds with the skin to continue killing germs even after washing.  Please do not use if you have an allergy to CHG or antibacterial soaps.  If your skin becomes reddened/irritated stop using the CHG.  Do not shave (including legs and underarms) for at least 48 hours prior to first CHG shower. It is OK to shave your face.  Please follow these instructions carefully.   1. Shower the NIGHT BEFORE SURGERY and the MORNING OF SURGERY with CHG.   2. If you chose to wash your hair, wash your hair first as usual with your normal shampoo.  3. After you shampoo, rinse your hair and body thoroughly to remove the shampoo.  4. Use CHG as you would any other liquid soap. You can apply CHG directly to the skin and wash gently with a scrungie or a clean washcloth.   5. Apply the CHG Soap to your body ONLY FROM THE NECK DOWN.  Do not use on open wounds or open sores. Avoid contact with your eyes, ears, mouth and genitals (private parts). Wash Face and genitals (private parts)  with your normal soap.  6. Wash thoroughly, paying special attention to the area where your surgery will be performed.  7. Thoroughly rinse your body with warm water from the neck down.  8. DO NOT shower/wash with your normal soap after using and rinsing off the CHG Soap.  9. Pat yourself dry with a CLEAN TOWEL.  10. Wear CLEAN PAJAMAS to bed the night before surgery, wear comfortable clothes  the morning of surgery  11. Place CLEAN SHEETS on your bed the night of your first shower and DO NOT SLEEP WITH PETS.  Day of Surgery: Shower as above. Do not apply any deodorants/lotions, powders or colognes. Please wear clean clothes to the hospital/surgery center.               Do not wear jewelry, make-up or nail polish.  Do not shave 48 hours prior to surgery.    Do not bring valuables to the hospital.  [facility] is not responsible for any belongings or valuables.  Contacts, dentures or bridgework may not be worn into surgery.  Leave your suitcase in the car.  After surgery it may be brought to your room.  For patients admitted to the hospital, discharge time will be  determined by your treatment team.  Patients discharged the day of surgery will not be allowed to drive home.   Please read over the following fact sheets that you were given: Pain Booklet, Patient Instructions for Mupirocin Application, Incentive Spirometry, Surgical Site Infections.

## 2017-09-22 NOTE — Progress Notes (Signed)
Anesthesia Chart Review:   Case:  [id number] Date/Time:  09/30/17 1215   Procedure:  RIGHT HIP IM NAIL REMOVAL WITH CONVERSION TO TOTAL HIP ARTHOPLASTY (Right Hip)   Anesthesia type:  Choice   Pre-op diagnosis:  OA RIGHT HIP   Location:  [facility] OR ROOM 09 / [facility]   Surgeon:  Renette Butters, MD      DISCUSSION: Pt is a 77 year old female   VS: BP 117/79   Pulse 78   Temp 36.7 C   Resp 18   Ht 5\' 4"  (1.626 m)   Wt 186 lb (84.4 kg)   SpO2 94%   BMI 31.93 kg/m    PROVIDERS: PCP is Wenda Low, MD who cleared pt for surgery at last office visit 09/10/17  Saw cardiologist Mertie Moores, MD in 2016 for chest pressure.  Stress test ordered, results normal.  F/u in 3 months recommended but did not happen.     LABS: Labs reviewed: Acceptable for surgery. (all labs ordered are listed, but only abnormal results are displayed)  Labs Reviewed  SURGICAL PCR SCREEN - Abnormal; Notable for the following components:      Result Value   MRSA, PCR POSITIVE (*)    Staphylococcus aureus POSITIVE (*)    All other components within normal limits  BASIC METABOLIC PANEL - Abnormal; Notable for the following components:   Potassium 3.3 (*)    BUN <5 (*)    GFR calc non Af Amer 58 (*)    All other components within normal limits  HEMOGLOBIN A1C  CBC  TYPE AND SCREEN     IMAGES:  CXR 01/15/17 (care everywhere):  - No acute airspace disease - Stable reticular opacities noted in bilateral lung bases and costophrenic angles, likely representing scarring and/or atelectasis.   EKG 08/24/17:  - Sinus tachycardia (118 bpm). Inferior infarct, age indeterminate.  - Inferior T wave inversions and q waves present on EKG on and off since 09/08/11  CV:  Echo 05/23/15:  - Left ventricle: The cavity size was normal. Wall thickness was normal. Systolic function was normal. The estimated ejection fraction was in the range of 55% to 60%. Wall motion was normal; there were no regional wall motion  abnormalities. Doppler parameters are consistent with abnormal left ventricular relaxation (grade 1 diastolic dysfunction). - Aortic valve: There was trivial regurgitation. - Mitral valve: There was mild regurgitation. - Left atrium: The atrium was mildly dilated. - Pulmonary arteries: Systolic pressure was mildly increased. PA peak pressure: 39 mm Hg (S). -  Impressions: Normal LV systolic function, grade 1 diastolic function, sclerotic aortic valve with trace AI, mild MR, mild LAE, mild MR, mildly elevated pulmonary pressure.  Nuclear stress test 07/20/14:  - Normal stress nuclear study.  No ischemia. - LV Ejection Fraction: 67%.  LV Wall Motion:  NL LV Function; NL Wall Motion.   Past Medical History:  Diagnosis Date  . Anemia   . B12 deficiency   . Bacteremia 10/07/2014  . Bladder incontinence   . Blind left eye   . Cancer ([place])    skin cancer   . Cataract   . Cervical spine fracture ([place])   . Chest pain 07/29/2013  . Compression fracture    C7,  upper T spine -compression fx  . COPD (chronic obstructive pulmonary disease) ([place])   . DDD (degenerative disc disease), lumbar   . Depression    major  . Diffuse myofascial pain syndrome 03/24/2015  . Dyspnea  at times- when activty  . Fever 10/05/2014  . GERD (gastroesophageal reflux disease)   . Hiatal hernia   . Hyperlipidemia   . Hypertension   . 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 Macular degeneration   . Osteoarthritis   . Osteoporosis   . Pneumonia    years ago  . Reactive airway disease   . Rupture of bowel ([place])   . Sepsis ([place]) 10/08/2014  . Stroke [facility])    "mini stroke"- balance and memory issue  . Stroke ([place])   . Wrist pain, acute 09/10/2012    Past Surgical History:  Procedure Laterality Date  . ABDOMINAL HYSTERECTOMY    . ABDOMINAL SURGERY    . APPENDECTOMY    . BLADDER SURGERY    . CHOLECYSTECTOMY    . COLON SURGERY    . COLOSTOMY REVERSAL    . EYE SURGERY     cataracts  . HIP  SURGERY Right 08/2016  . INTRAMEDULLARY (IM) NAIL INTERTROCHANTERIC Right 09/13/2016   Procedure: INTRAMEDULLARY (IM) NAIL INTERTROCHANTRIC RIGHT;  Surgeon: Leandrew Koyanagi, MD;  Location: [facility];  Service: Orthopedics;  Laterality: Right;  . JOINT REPLACEMENT Bilateral   . KNEE SURGERY    . OSTOMY    . RECTOCELE REPAIR    . TOE SURGERY Right    3rd    MEDICATIONS: . acetaminophen (TYLENOL) 500 MG tablet  . albuterol (ACCUNEB) 1.25 MG/3ML nebulizer solution  . albuterol (PROVENTIL HFA;VENTOLIN HFA) 108 (90 BASE) MCG/ACT inhaler  . alendronate (FOSAMAX) 70 MG tablet  . ALPRAZolam (XANAX) 1 MG tablet  . amLODipine (NORVASC) 5 MG tablet  . Calcium-Phosphorus-Vitamin D (CALCIUM/D3 ADULT GUMMIES PO)  . citalopram (CELEXA) 20 MG tablet  . docusate sodium (COLACE) 100 MG capsule  . esomeprazole (NEXIUM) 40 MG capsule  . gabapentin (NEURONTIN) 400 MG capsule  . GINKGO BILOBA EXTRACT PO  . [START ON 09/26/2017] HYDROcodone-acetaminophen (NORCO/VICODIN) 5-325 MG tablet  . [START ON 10/26/2017] HYDROcodone-acetaminophen (NORCO/VICODIN) 5-325 MG tablet  . Krill Oil 350 MG CAPS  . lactose free nutrition (BOOST) LIQD  . linaclotide (LINZESS) 290 MCG CAPS capsule  . lovastatin (MEVACOR) 10 MG tablet  . meloxicam (MOBIC) 15 MG tablet  . Menthol, Topical Analgesic, (BIOFREEZE EX)  . Multiple Vitamins-Minerals (MULTIVITAMIN GUMMIES ADULT PO)  . ondansetron (ZOFRAN-ODT) 8 MG disintegrating tablet  . orphenadrine (NORFLEX) 100 MG tablet  . polyethylene glycol (MIRALAX / GLYCOLAX) packet  . Probiotic Product (PROBIOTIC PO)  . senna (SENOKOT) 8.6 MG TABS tablet  . traZODone (DESYREL) 100 MG tablet   No current facility-administered medications for this encounter.     If no changes, I anticipate pt can proceed with surgery as scheduled.   Willeen Cass, FNP-BC [facility] Short Stay Surgical Center/Anesthesiology Phone: [phone] 09/22/2017 4:34 PM

## 2017-09-22 NOTE — Progress Notes (Signed)
[name] brought a letter from [name], patient's pain management Nicole and said that [name] needs the orders. I called and spoke with [name] and she said that they  Have a copy of instructions from [name].

## 2017-09-29 MED ORDER — TRANEXAMIC ACID 1000 MG/10ML IV SOLN
2000.0000 mg | Freq: Once | INTRAVENOUS | Status: AC
  Administered 2017-09-30: 2000 mg via TOPICAL
  Filled 2017-09-29: qty 20

## 2017-09-29 MED ORDER — VANCOMYCIN HCL 1 G IV SOLR
1000.0000 mg | INTRAVENOUS | Status: DC
  Filled 2017-09-29: qty 1000

## 2017-09-29 MED ORDER — TRANEXAMIC ACID 1000 MG/10ML IV SOLN
1000.0000 mg | INTRAVENOUS | Status: AC
  Administered 2017-09-30: 1000 mg via INTRAVENOUS
  Filled 2017-09-29: qty 1100

## 2017-09-29 MED ORDER — GABAPENTIN 400 MG PO CAPS
400.0000 mg | ORAL_CAPSULE | Freq: Once | ORAL | Status: DC
  Filled 2017-09-29 (×2): qty 1

## 2017-09-29 MED ORDER — CLINDAMYCIN PHOSPHATE 900 MG/50ML IV SOLN
900.0000 mg | INTRAVENOUS | Status: DC
  Filled 2017-09-29: qty 50

## 2017-09-29 MED ORDER — VANCOMYCIN HCL 10 G IV SOLR
1500.0000 mg | INTRAVENOUS | Status: AC
  Administered 2017-09-30: 1500 mg via INTRAVENOUS
  Filled 2017-09-29: qty 1500

## 2017-09-29 MED ORDER — DEXTROSE 5 % IV SOLN
900.0000 mg | INTRAVENOUS | Status: DC
  Filled 2017-09-29: qty 6

## 2017-09-29 MED ORDER — BUPIVACAINE LIPOSOME 1.3 % IJ SUSP
20.0000 mL | Freq: Once | INTRAMUSCULAR | Status: DC
  Filled 2017-09-29 (×2): qty 20

## 2017-09-29 MED ORDER — LACTATED RINGERS IV SOLN
INTRAVENOUS | Status: DC
  Administered 2017-09-30 (×3): via INTRAVENOUS

## 2017-09-29 MED ORDER — ACETAMINOPHEN 500 MG PO TABS
1000.0000 mg | ORAL_TABLET | Freq: Once | ORAL | Status: AC
  Administered 2017-09-30: 1000 mg via ORAL
  Filled 2017-09-29: qty 2

## 2017-09-30 ENCOUNTER — Inpatient Hospital Stay (HOSPITAL_COMMUNITY): Payer: Medicare Other

## 2017-09-30 ENCOUNTER — Encounter (HOSPITAL_COMMUNITY): Payer: Self-pay | Admitting: *Deleted

## 2017-09-30 ENCOUNTER — Inpatient Hospital Stay (HOSPITAL_COMMUNITY)
Admission: RE | Admit: 2017-09-30 | Discharge: 2017-10-02 | DRG: 470 | Disposition: A | Discharge status: Home Health | Payer: Medicare Other | Source: Ambulatory Visit | Attending: Orthopedic Surgery | Admitting: Orthopedic Surgery

## 2017-09-30 ENCOUNTER — Inpatient Hospital Stay (HOSPITAL_COMMUNITY): Payer: Medicare Other | Admitting: Anesthesiology

## 2017-09-30 ENCOUNTER — Encounter (HOSPITAL_COMMUNITY)
Admission: RE | Disposition: A | Discharge status: Home Health | Payer: Self-pay | Source: Ambulatory Visit | Attending: Orthopedic Surgery

## 2017-09-30 ENCOUNTER — Inpatient Hospital Stay (HOSPITAL_COMMUNITY): Payer: Medicare Other | Admitting: Emergency Medicine

## 2017-09-30 DIAGNOSIS — Z7951 Long term (current) use of inhaled steroids: Secondary | ICD-10-CM | POA: Diagnosis not present

## 2017-09-30 DIAGNOSIS — E7800 Pure hypercholesterolemia, unspecified: Secondary | ICD-10-CM | POA: Diagnosis present

## 2017-09-30 DIAGNOSIS — Z7982 Long term (current) use of aspirin: Secondary | ICD-10-CM | POA: Diagnosis not present

## 2017-09-30 DIAGNOSIS — R339 Retention of urine, unspecified: Secondary | ICD-10-CM | POA: Diagnosis not present

## 2017-09-30 DIAGNOSIS — F329 Major depressive disorder, single episode, unspecified: Secondary | ICD-10-CM | POA: Diagnosis present

## 2017-09-30 DIAGNOSIS — Z79899 Other long term (current) drug therapy: Secondary | ICD-10-CM

## 2017-09-30 DIAGNOSIS — D62 Acute posthemorrhagic anemia: Secondary | ICD-10-CM | POA: Diagnosis not present

## 2017-09-30 DIAGNOSIS — F419 Anxiety disorder, unspecified: Secondary | ICD-10-CM | POA: Diagnosis present

## 2017-09-30 DIAGNOSIS — R413 Other amnesia: Secondary | ICD-10-CM

## 2017-09-30 DIAGNOSIS — N179 Acute kidney failure, unspecified: Secondary | ICD-10-CM | POA: Diagnosis not present

## 2017-09-30 DIAGNOSIS — M1610 Unilateral primary osteoarthritis, unspecified hip: Secondary | ICD-10-CM | POA: Diagnosis present

## 2017-09-30 DIAGNOSIS — G894 Chronic pain syndrome: Secondary | ICD-10-CM | POA: Diagnosis present

## 2017-09-30 DIAGNOSIS — S72141K Displaced intertrochanteric fracture of right femur, subsequent encounter for closed fracture with nonunion: Principal | ICD-10-CM

## 2017-09-30 DIAGNOSIS — N183 Chronic kidney disease, stage 3 unspecified: Secondary | ICD-10-CM | POA: Diagnosis present

## 2017-09-30 DIAGNOSIS — J449 Chronic obstructive pulmonary disease, unspecified: Secondary | ICD-10-CM | POA: Diagnosis present

## 2017-09-30 DIAGNOSIS — F324 Major depressive disorder, single episode, in partial remission: Secondary | ICD-10-CM

## 2017-09-30 DIAGNOSIS — I129 Hypertensive chronic kidney disease with stage 1 through stage 4 chronic kidney disease, or unspecified chronic kidney disease: Secondary | ICD-10-CM | POA: Diagnosis present

## 2017-09-30 DIAGNOSIS — M810 Age-related osteoporosis without current pathological fracture: Secondary | ICD-10-CM | POA: Diagnosis present

## 2017-09-30 DIAGNOSIS — H5462 Unqualified visual loss, left eye, normal vision right eye: Secondary | ICD-10-CM | POA: Diagnosis present

## 2017-09-30 DIAGNOSIS — Z85828 Personal history of other malignant neoplasm of skin: Secondary | ICD-10-CM | POA: Diagnosis not present

## 2017-09-30 DIAGNOSIS — M1611 Unilateral primary osteoarthritis, right hip: Secondary | ICD-10-CM | POA: Diagnosis present

## 2017-09-30 DIAGNOSIS — H3530 Unspecified macular degeneration: Secondary | ICD-10-CM | POA: Diagnosis present

## 2017-09-30 DIAGNOSIS — W19XXXD Unspecified fall, subsequent encounter: Secondary | ICD-10-CM | POA: Diagnosis present

## 2017-09-30 DIAGNOSIS — G8929 Other chronic pain: Secondary | ICD-10-CM

## 2017-09-30 DIAGNOSIS — S72001G Fracture of unspecified part of neck of right femur, subsequent encounter for closed fracture with delayed healing: Secondary | ICD-10-CM

## 2017-09-30 DIAGNOSIS — Z96649 Presence of unspecified artificial hip joint: Secondary | ICD-10-CM

## 2017-09-30 DIAGNOSIS — Z8673 Personal history of transient ischemic attack (TIA), and cerebral infarction without residual deficits: Secondary | ICD-10-CM | POA: Diagnosis not present

## 2017-09-30 DIAGNOSIS — Z87891 Personal history of nicotine dependence: Secondary | ICD-10-CM

## 2017-09-30 DIAGNOSIS — M545 Low back pain, unspecified: Secondary | ICD-10-CM

## 2017-09-30 DIAGNOSIS — Z8719 Personal history of other diseases of the digestive system: Secondary | ICD-10-CM

## 2017-09-30 DIAGNOSIS — Z419 Encounter for procedure for purposes other than remedying health state, unspecified: Secondary | ICD-10-CM

## 2017-09-30 DIAGNOSIS — K219 Gastro-esophageal reflux disease without esophagitis: Secondary | ICD-10-CM | POA: Diagnosis present

## 2017-09-30 DIAGNOSIS — I10 Essential (primary) hypertension: Secondary | ICD-10-CM | POA: Diagnosis present

## 2017-09-30 HISTORY — PX: TOTAL HIP ARTHROPLASTY: SHX124

## 2017-09-30 LAB — CBC
HCT: 34 % — ABNORMAL LOW (ref 36.0–46.0)
Hemoglobin: 10.8 g/dL — ABNORMAL LOW (ref 12.0–15.0)
MCH: 30 pg (ref 26.0–34.0)
MCHC: 31.8 g/dL (ref 30.0–36.0)
MCV: 94.4 fL (ref 78.0–100.0)
Platelets: 153 10*3/uL (ref 150–400)
RBC: 3.6 MIL/uL — ABNORMAL LOW (ref 3.87–5.11)
RDW: 14.4 % (ref 11.5–15.5)
WBC: 12.9 10*3/uL — ABNORMAL HIGH (ref 4.0–10.5)

## 2017-09-30 LAB — CREATININE, SERUM
Creatinine, Ser: 1.15 mg/dL — ABNORMAL HIGH (ref 0.44–1.00)
GFR calc Af Amer: 52 mL/min — ABNORMAL LOW (ref >60)
GFR calc non Af Amer: 45 mL/min — ABNORMAL LOW (ref >60)

## 2017-09-30 SURGERY — ARTHROPLASTY, HIP, TOTAL, ANTERIOR APPROACH
Anesthesia: General | Site: Hip | Laterality: Right

## 2017-09-30 MED ORDER — DIPHENHYDRAMINE HCL 12.5 MG/5ML PO ELIX: 12.5000 mg | ORAL_SOLUTION | ORAL | Status: DC | PRN

## 2017-09-30 MED ORDER — ACETAMINOPHEN 500 MG PO TABS
1000.0000 mg | ORAL_TABLET | Freq: Four times a day (QID) | ORAL | Status: AC
  Administered 2017-09-30 – 2017-10-01 (×4): 1000 mg via ORAL
  Filled 2017-09-30 (×4): qty 2

## 2017-09-30 MED ORDER — ALPRAZOLAM 0.5 MG PO TABS
1.0000 mg | ORAL_TABLET | Freq: Every day | ORAL | Status: DC
  Administered 2017-09-30 – 2017-10-01 (×2): 1 mg via ORAL
  Filled 2017-09-30 (×2): qty 2

## 2017-09-30 MED ORDER — MENTHOL 3 MG MT LOZG: 1.0000 | LOZENGE | OROMUCOSAL | Status: DC | PRN

## 2017-09-30 MED ORDER — ALBUTEROL SULFATE HFA 108 (90 BASE) MCG/ACT IN AERS: 2.0000 | INHALATION_SPRAY | Freq: Four times a day (QID) | RESPIRATORY_TRACT | Status: DC | PRN

## 2017-09-30 MED ORDER — VANCOMYCIN HCL IN DEXTROSE 1-5 GM/200ML-% IV SOLN
1000.0000 mg | Freq: Two times a day (BID) | INTRAVENOUS | Status: AC
  Administered 2017-09-30: 1000 mg via INTRAVENOUS
  Filled 2017-09-30: qty 200

## 2017-09-30 MED ORDER — SUGAMMADEX SODIUM 200 MG/2ML IV SOLN
INTRAVENOUS | Status: DC | PRN
  Administered 2017-09-30: 100 mg via INTRAVENOUS

## 2017-09-30 MED ORDER — METOCLOPRAMIDE HCL 5 MG/ML IJ SOLN: 5.0000 mg | Freq: Three times a day (TID) | INTRAMUSCULAR | Status: DC | PRN

## 2017-09-30 MED ORDER — PROPOFOL 10 MG/ML IV BOLUS
INTRAVENOUS | Status: AC
  Filled 2017-09-30: qty 20

## 2017-09-30 MED ORDER — SENNA 8.6 MG PO TABS: 2.0000 | ORAL_TABLET | Freq: Every day | ORAL | Status: DC | PRN

## 2017-09-30 MED ORDER — FENTANYL CITRATE (PF) 250 MCG/5ML IJ SOLN
INTRAMUSCULAR | Status: AC
  Filled 2017-09-30: qty 5

## 2017-09-30 MED ORDER — LINACLOTIDE 145 MCG PO CAPS
290.0000 ug | ORAL_CAPSULE | Freq: Every day | ORAL | Status: DC
  Administered 2017-10-01 – 2017-10-02 (×2): 290 ug via ORAL
  Filled 2017-09-30 (×2): qty 2

## 2017-09-30 MED ORDER — DEXAMETHASONE SODIUM PHOSPHATE 10 MG/ML IJ SOLN
INTRAMUSCULAR | Status: AC
  Filled 2017-09-30: qty 1

## 2017-09-30 MED ORDER — LIDOCAINE 2% (20 MG/ML) 5 ML SYRINGE
INTRAMUSCULAR | Status: AC
  Filled 2017-09-30: qty 10

## 2017-09-30 MED ORDER — ENOXAPARIN SODIUM 40 MG/0.4ML ~~LOC~~ SOLN: 40.0000 mg | SUBCUTANEOUS | Status: DC

## 2017-09-30 MED ORDER — PRAVASTATIN SODIUM 20 MG PO TABS
10.0000 mg | ORAL_TABLET | Freq: Every morning | ORAL | Status: DC
  Administered 2017-10-01 – 2017-10-02 (×2): 10 mg via ORAL
  Filled 2017-09-30 (×2): qty 1

## 2017-09-30 MED ORDER — LACTATED RINGERS IV SOLN
INTRAVENOUS | Status: DC
  Administered 2017-09-30 – 2017-10-01 (×2): via INTRAVENOUS

## 2017-09-30 MED ORDER — LABETALOL HCL 5 MG/ML IV SOLN
INTRAVENOUS | Status: DC | PRN
  Administered 2017-09-30: 2.5 mg via INTRAVENOUS

## 2017-09-30 MED ORDER — TRAZODONE HCL 100 MG PO TABS
100.0000 mg | ORAL_TABLET | Freq: Every day | ORAL | Status: DC
  Administered 2017-09-30 – 2017-10-01 (×2): 100 mg via ORAL
  Filled 2017-09-30 (×2): qty 1

## 2017-09-30 MED ORDER — CITALOPRAM HYDROBROMIDE 20 MG PO TABS
30.0000 mg | ORAL_TABLET | Freq: Every day | ORAL | Status: DC
  Administered 2017-09-30 – 2017-10-02 (×3): 30 mg via ORAL
  Filled 2017-09-30 (×3): qty 1

## 2017-09-30 MED ORDER — MAGNESIUM CITRATE PO SOLN: 1.0000 | Freq: Once | ORAL | Status: DC | PRN

## 2017-09-30 MED ORDER — CHLORHEXIDINE GLUCONATE 4 % EX LIQD: 60.0000 mL | Freq: Once | CUTANEOUS | Status: DC

## 2017-09-30 MED ORDER — OXYCODONE HCL 5 MG PO TABS
5.0000 mg | ORAL_TABLET | ORAL | Status: DC | PRN
  Administered 2017-10-01 – 2017-10-02 (×4): 10 mg via ORAL
  Administered 2017-10-02: 5 mg via ORAL
  Filled 2017-09-30: qty 1
  Filled 2017-09-30 (×4): qty 2

## 2017-09-30 MED ORDER — ONDANSETRON HCL 4 MG/2ML IJ SOLN
INTRAMUSCULAR | Status: AC
  Filled 2017-09-30: qty 2

## 2017-09-30 MED ORDER — ACETAMINOPHEN 500 MG PO TABS: 1000.0000 mg | ORAL_TABLET | Freq: Three times a day (TID) | ORAL | 0 refills | Status: AC

## 2017-09-30 MED ORDER — SORBITOL 70 % SOLN: 30.0000 mL | Freq: Every day | Status: DC | PRN

## 2017-09-30 MED ORDER — METOCLOPRAMIDE HCL 5 MG PO TABS: 5.0000 mg | ORAL_TABLET | Freq: Three times a day (TID) | ORAL | Status: DC | PRN

## 2017-09-30 MED ORDER — 0.9 % SODIUM CHLORIDE (POUR BTL) OPTIME
TOPICAL | Status: DC | PRN
  Administered 2017-09-30: 1000 mL

## 2017-09-30 MED ORDER — ACETAMINOPHEN 10 MG/ML IV SOLN: 1000.0000 mg | Freq: Once | INTRAVENOUS | Status: DC | PRN

## 2017-09-30 MED ORDER — METHOCARBAMOL 1000 MG/10ML IJ SOLN
500.0000 mg | Freq: Four times a day (QID) | INTRAVENOUS | Status: DC | PRN
  Filled 2017-09-30: qty 5

## 2017-09-30 MED ORDER — METHOCARBAMOL 500 MG PO TABS: 500.0000 mg | ORAL_TABLET | Freq: Four times a day (QID) | ORAL | Status: DC | PRN

## 2017-09-30 MED ORDER — SUGAMMADEX SODIUM 200 MG/2ML IV SOLN
INTRAVENOUS | Status: AC
  Filled 2017-09-30: qty 2

## 2017-09-30 MED ORDER — ONDANSETRON HCL 4 MG PO TABS: 4.0000 mg | ORAL_TABLET | Freq: Four times a day (QID) | ORAL | Status: DC | PRN

## 2017-09-30 MED ORDER — HYDROCODONE-ACETAMINOPHEN 7.5-325 MG PO TABS: 1.0000 | ORAL_TABLET | Freq: Once | ORAL | Status: DC | PRN

## 2017-09-30 MED ORDER — AMLODIPINE BESYLATE 5 MG PO TABS
5.0000 mg | ORAL_TABLET | Freq: Every day | ORAL | Status: DC
  Administered 2017-10-01 – 2017-10-02 (×2): 5 mg via ORAL
  Filled 2017-09-30 (×2): qty 1

## 2017-09-30 MED ORDER — PROPOFOL 10 MG/ML IV BOLUS
INTRAVENOUS | Status: DC | PRN
  Administered 2017-09-30: 140 mg via INTRAVENOUS
  Administered 2017-09-30: 60 mg via INTRAVENOUS

## 2017-09-30 MED ORDER — GABAPENTIN 400 MG PO CAPS
400.0000 mg | ORAL_CAPSULE | Freq: Every day | ORAL | Status: DC
  Administered 2017-09-30 – 2017-10-01 (×2): 400 mg via ORAL
  Filled 2017-09-30 (×2): qty 1

## 2017-09-30 MED ORDER — PHENOL 1.4 % MT LIQD: 1.0000 | OROMUCOSAL | Status: DC | PRN

## 2017-09-30 MED ORDER — ALBUTEROL SULFATE (2.5 MG/3ML) 0.083% IN NEBU: 3.0000 mL | INHALATION_SOLUTION | Freq: Four times a day (QID) | RESPIRATORY_TRACT | Status: DC | PRN

## 2017-09-30 MED ORDER — BUPIVACAINE LIPOSOME 1.3 % IJ SUSP
INTRAMUSCULAR | Status: DC | PRN
  Administered 2017-09-30: 20 mL

## 2017-09-30 MED ORDER — DEXAMETHASONE SODIUM PHOSPHATE 10 MG/ML IJ SOLN
10.0000 mg | Freq: Once | INTRAMUSCULAR | Status: AC
  Administered 2017-10-01: 10 mg via INTRAVENOUS
  Filled 2017-09-30: qty 1

## 2017-09-30 MED ORDER — ONDANSETRON HCL 4 MG/2ML IJ SOLN: 4.0000 mg | Freq: Four times a day (QID) | INTRAMUSCULAR | Status: DC | PRN

## 2017-09-30 MED ORDER — DIPHENHYDRAMINE HCL 50 MG/ML IJ SOLN
INTRAMUSCULAR | Status: AC
  Filled 2017-09-30: qty 1

## 2017-09-30 MED ORDER — PANTOPRAZOLE SODIUM 40 MG PO TBEC
40.0000 mg | DELAYED_RELEASE_TABLET | Freq: Every day | ORAL | Status: DC
  Administered 2017-10-01 – 2017-10-02 (×2): 40 mg via ORAL
  Filled 2017-09-30 (×2): qty 1

## 2017-09-30 MED ORDER — ENOXAPARIN SODIUM 40 MG/0.4ML ~~LOC~~ SOLN: 40.0000 mg | SUBCUTANEOUS | 0 refills | Status: DC

## 2017-09-30 MED ORDER — POLYETHYLENE GLYCOL 3350 17 G PO PACK
17.0000 g | PACK | Freq: Every day | ORAL | Status: DC
  Administered 2017-09-30: 17 g via ORAL
  Filled 2017-09-30: qty 1

## 2017-09-30 MED ORDER — ONDANSETRON HCL 4 MG/2ML IJ SOLN
INTRAMUSCULAR | Status: DC | PRN
  Administered 2017-09-30: 4 mg via INTRAVENOUS

## 2017-09-30 MED ORDER — MIDAZOLAM HCL 2 MG/2ML IJ SOLN
INTRAMUSCULAR | Status: AC
  Filled 2017-09-30: qty 2

## 2017-09-30 MED ORDER — POVIDONE-IODINE 10 % EX SWAB: 2.0000 "application " | Freq: Once | CUTANEOUS | Status: DC

## 2017-09-30 MED ORDER — HYDROMORPHONE HCL 2 MG/ML IJ SOLN
INTRAMUSCULAR | Status: AC
  Filled 2017-09-30: qty 1

## 2017-09-30 MED ORDER — DIPHENHYDRAMINE HCL 50 MG/ML IJ SOLN
25.0000 mg | Freq: Once | INTRAMUSCULAR | Status: AC
  Administered 2017-09-30: 25 mg via INTRAVENOUS
  Filled 2017-09-30: qty 0.5

## 2017-09-30 MED ORDER — NALOXEGOL OXALATE 12.5 MG PO TABS: 12.5000 mg | ORAL_TABLET | Freq: Every day | ORAL | 0 refills | Status: DC

## 2017-09-30 MED ORDER — MEPERIDINE HCL 50 MG/ML IJ SOLN: 6.2500 mg | INTRAMUSCULAR | Status: DC | PRN

## 2017-09-30 MED ORDER — SODIUM CHLORIDE FLUSH 0.9 % IV SOLN
INTRAVENOUS | Status: DC | PRN
  Administered 2017-09-30: 30 mL

## 2017-09-30 MED ORDER — ROCURONIUM BROMIDE 100 MG/10ML IV SOLN
INTRAVENOUS | Status: DC | PRN
  Administered 2017-09-30: 10 mg via INTRAVENOUS
  Administered 2017-09-30: 40 mg via INTRAVENOUS

## 2017-09-30 MED ORDER — POLYETHYLENE GLYCOL 3350 17 G PO PACK
17.0000 g | PACK | Freq: Every day | ORAL | Status: DC | PRN
  Filled 2017-09-30: qty 1

## 2017-09-30 MED ORDER — ALBUMIN HUMAN 5 % IV SOLN
INTRAVENOUS | Status: DC | PRN
  Administered 2017-09-30 (×2): via INTRAVENOUS

## 2017-09-30 MED ORDER — PROMETHAZINE HCL 25 MG/ML IJ SOLN: 6.2500 mg | INTRAMUSCULAR | Status: DC | PRN

## 2017-09-30 MED ORDER — OXYCODONE HCL 5 MG PO TABS: 5.0000 mg | ORAL_TABLET | ORAL | 0 refills | Status: DC | PRN

## 2017-09-30 MED ORDER — FENTANYL CITRATE (PF) 100 MCG/2ML IJ SOLN
INTRAMUSCULAR | Status: DC | PRN
  Administered 2017-09-30 (×3): 50 ug via INTRAVENOUS
  Administered 2017-09-30 (×2): 100 ug via INTRAVENOUS
  Administered 2017-09-30 (×2): 50 ug via INTRAVENOUS

## 2017-09-30 MED ORDER — LABETALOL HCL 5 MG/ML IV SOLN
INTRAVENOUS | Status: AC
  Filled 2017-09-30: qty 4

## 2017-09-30 MED ORDER — PHENYLEPHRINE HCL 10 MG/ML IJ SOLN
INTRAVENOUS | Status: DC | PRN
  Administered 2017-09-30: 40 ug/min via INTRAVENOUS

## 2017-09-30 MED ORDER — KETOROLAC TROMETHAMINE 15 MG/ML IJ SOLN
15.0000 mg | Freq: Four times a day (QID) | INTRAMUSCULAR | Status: AC
  Administered 2017-09-30 – 2017-10-01 (×4): 15 mg via INTRAVENOUS
  Filled 2017-09-30 (×4): qty 1

## 2017-09-30 MED ORDER — DOCUSATE SODIUM 100 MG PO CAPS
100.0000 mg | ORAL_CAPSULE | Freq: Two times a day (BID) | ORAL | Status: DC
  Administered 2017-09-30 – 2017-10-02 (×3): 100 mg via ORAL
  Filled 2017-09-30 (×4): qty 1

## 2017-09-30 MED ORDER — DOCUSATE SODIUM 100 MG PO CAPS: 100.0000 mg | ORAL_CAPSULE | Freq: Two times a day (BID) | ORAL | 0 refills | Status: DC

## 2017-09-30 MED ORDER — DEXAMETHASONE SODIUM PHOSPHATE 10 MG/ML IJ SOLN
INTRAMUSCULAR | Status: DC | PRN
  Administered 2017-09-30: 10 mg via INTRAVENOUS

## 2017-09-30 MED ORDER — HYDROMORPHONE HCL 2 MG/ML IJ SOLN
0.2500 mg | INTRAMUSCULAR | Status: DC | PRN
  Administered 2017-09-30 (×4): 0.5 mg via INTRAVENOUS

## 2017-09-30 SURGICAL SUPPLY — 56 items
BAG DECANTER FOR FLEXI CONT (MISCELLANEOUS) ×2 IMPLANT
BLADE SAG 18X100X1.27 (BLADE) ×2 IMPLANT
CLSR STERI-STRIP ANTIMIC 1/2X4 (GAUZE/BANDAGES/DRESSINGS) ×2 IMPLANT
COVER PERINEAL POST (MISCELLANEOUS) ×2 IMPLANT
COVER SURGICAL LIGHT HANDLE (MISCELLANEOUS) ×2 IMPLANT
DRAPE C-ARM 42X72 X-RAY (DRAPES) ×2 IMPLANT
DRAPE STERI IOBAN 125X83 (DRAPES) ×2 IMPLANT
DRAPE U-SHAPE 47X51 STRL (DRAPES) ×4 IMPLANT
DRSG MEPILEX BORDER 4X4 (GAUZE/BANDAGES/DRESSINGS) ×4 IMPLANT
DRSG MEPILEX BORDER 4X8 (GAUZE/BANDAGES/DRESSINGS) ×2 IMPLANT
DURAPREP 26ML APPLICATOR (WOUND CARE) ×2 IMPLANT
ELECT BLADE 4.0 EZ CLEAN MEGAD (MISCELLANEOUS) ×2
ELECT REM PT RETURN 9FT ADLT (ELECTROSURGICAL) ×2
ELECTRODE BLDE 4.0 EZ CLN MEGD (MISCELLANEOUS) ×1 IMPLANT
ELECTRODE REM PT RTRN 9FT ADLT (ELECTROSURGICAL) ×1 IMPLANT
FACESHIELD WRAPAROUND (MASK) ×8 IMPLANT
GLOVE BIO SURGEON STRL SZ7.5 (GLOVE) ×4 IMPLANT
GLOVE BIOGEL PI IND STRL 8 (GLOVE) ×2 IMPLANT
GLOVE BIOGEL PI INDICATOR 8 (GLOVE) ×2
GOWN STRL REUS W/ TWL LRG LVL3 (GOWN DISPOSABLE) ×6 IMPLANT
GOWN STRL REUS W/TWL LRG LVL3 (GOWN DISPOSABLE) ×6
HEAD FEMORAL C TAPER 36X-2.5M (Head) ×2 IMPLANT
HIP STEM RESTOR SZ 9 (Stem) ×2 IMPLANT
INSERT TRIDENT POLY 36MM 0DEG (Insert) ×2 IMPLANT
KIT BASIN OR (CUSTOM PROCEDURE TRAY) ×2 IMPLANT
KIT TURNOVER KIT B (KITS) ×2 IMPLANT
MANIFOLD NEPTUNE II (INSTRUMENTS) ×2 IMPLANT
NDL SAFETY ECLIPSE 18X1.5 (NEEDLE) IMPLANT
NEEDLE HYPO 18GX1.5 SHARP (NEEDLE)
NEEDLE HYPO 22GX1.5 SAFETY (NEEDLE) ×2 IMPLANT
NS IRRIG 1000ML POUR BTL (IV SOLUTION) ×2 IMPLANT
PACK TOTAL JOINT (CUSTOM PROCEDURE TRAY) ×2 IMPLANT
PAD ARMBOARD 7.5X6 YLW CONV (MISCELLANEOUS) ×2 IMPLANT
SCREW HEX LP 6.5X20 (Screw) ×2 IMPLANT
SHELL ACETABUL CLUSTER SZ 54 (Shell) ×2 IMPLANT
SPONGE LAP 18X18 X RAY DECT (DISPOSABLE) IMPLANT
STEM HIP RESTOR SZ 9 (Stem) ×1 IMPLANT
SUT MNCRL AB 4-0 PS2 18 (SUTURE) ×2 IMPLANT
SUT MON AB 2-0 CT1 36 (SUTURE) ×2 IMPLANT
SUT MON AB 4-0 PC3 18 (SUTURE) ×2 IMPLANT
SUT VIC AB 0 CT1 27 (SUTURE) ×1
SUT VIC AB 0 CT1 27XBRD ANBCTR (SUTURE) ×1 IMPLANT
SUT VIC AB 1 CT1 27 (SUTURE) ×3
SUT VIC AB 1 CT1 27XBRD ANBCTR (SUTURE) ×3 IMPLANT
SUT VIC AB 2-0 CT1 27 (SUTURE) ×3
SUT VIC AB 2-0 CT1 TAPERPNT 27 (SUTURE) ×3 IMPLANT
SUT VLOC 180 0 24IN GS25 (SUTURE) ×2 IMPLANT
SYR 50ML LL SCALE MARK (SYRINGE) ×2 IMPLANT
SYR BULB IRRIGATION 50ML (SYRINGE) ×2 IMPLANT
SYRINGE 20CC LL (MISCELLANEOUS) ×2 IMPLANT
TOWEL OR 17X24 6PK STRL BLUE (TOWEL DISPOSABLE) ×2 IMPLANT
TOWEL OR 17X26 10 PK STRL BLUE (TOWEL DISPOSABLE) ×2 IMPLANT
TRAY CATH 16FR W/PLASTIC CATH (SET/KITS/TRAYS/PACK) ×2 IMPLANT
TRAY FOLEY MTR SLVR 16FR STAT (SET/KITS/TRAYS/PACK) IMPLANT
WATER STERILE IRR 1000ML POUR (IV SOLUTION) IMPLANT
YANKAUER SUCT BULB TIP NO VENT (SUCTIONS) ×2 IMPLANT

## 2017-09-30 NOTE — Anesthesia Postprocedure Evaluation (Signed)
Anesthesia Post Note  Patient: [name]  Procedure(s) Performed: RIGHT HIP IM NAIL REMOVAL WITH CONVERSION TO TOTAL HIP ARTHOPLASTY, anterior approach (Right Hip)     Patient location during evaluation: PACU Anesthesia Type: General Level of consciousness: awake and alert Pain management: pain level controlled Vital Signs Assessment: post-procedure vital signs reviewed and stable Respiratory status: spontaneous breathing, nonlabored ventilation, respiratory function stable and patient connected to nasal cannula oxygen Cardiovascular status: blood pressure returned to baseline and stable Postop Assessment: no apparent nausea or vomiting Anesthetic complications: no    Last Vitals:  Vitals:   09/30/17 1715 09/30/17 1723  BP:  (!) 118/98  Pulse: 83 66  Resp: (!) 8 12  Temp:  36.6 C  SpO2: 96% 92%    Last Pain:  Vitals:   09/30/17 1723  TempSrc: Oral  PainSc: [name]

## 2017-09-30 NOTE — Progress Notes (Signed)
Pt. Complaining of itching after starting Vancomycin.  No rash noted  Dr. Percell Miller notified,orders received

## 2017-09-30 NOTE — Anesthesia Procedure Notes (Signed)
Procedure Name: Intubation Date/Time: 09/30/2017 10:52 AM Performed by: Izora Gala, CRNA Pre-anesthesia Checklist: Patient identified, Emergency Drugs available, Suction available and Patient being monitored Patient Re-evaluated:Patient Re-evaluated prior to induction Oxygen Delivery Method: Circle system utilized Preoxygenation: Pre-oxygenation with 100% oxygen Induction Type: IV induction Ventilation: Mask ventilation without difficulty Laryngoscope Size: Miller and 3 Grade View: Grade I Tube type: Oral Tube size: 7.5 mm Number of attempts: 1 Placement Confirmation: ETT inserted through vocal cords under direct vision,  positive ETCO2 and breath sounds checked- equal and bilateral Secured at: 22 cm Tube secured with: Tape Dental Injury: Teeth and Oropharynx as per pre-operative assessment

## 2017-09-30 NOTE — Discharge Instructions (Signed)
You may bear weight as tolerated. Keep your dressing on and dry until follow up. Take medicine to prevent blood clots as directed. Take pain medicine as needed with the goal of transitioning to over the counter medicines.  If needed, you may increase pain medication for the first few days post up to 2 tablets every 4 hours.  INSTRUCTIONS AFTER JOINT REPLACEMENT   o Remove items at home which could result in a fall. This includes throw rugs or furniture in walking pathways o ICE to the affected joint every three hours while awake for 30 minutes at a time, for at least the first 3-5 days, and then as needed for pain and swelling.  Continue to use ice for pain and swelling. You may notice swelling that will progress down to the foot and ankle.  This is normal after surgery.  Elevate your leg when you are not up walking on it.   o Continue to use the breathing machine you got in the hospital (incentive spirometer) which will help keep your temperature down.  It is common for your temperature to cycle up and down following surgery, especially at night when you are not up moving around and exerting yourself.  The breathing machine keeps your lungs expanded and your temperature down.   DIET:  As you were doing prior to hospitalization, we recommend a well-balanced diet.  DRESSING / WOUND CARE / SHOWERING  You may shower 3 days after surgery, but keep the wounds dry during showering.  You may use an occlusive plastic wrap (Press'n Seal for example) with blue painter's tape at edges, NO SOAKING/SUBMERGING IN THE BATHTUB.  If the bandage gets wet, change with a clean dry gauze.  If the incision gets wet, pat the wound dry with a clean towel.  ACTIVITY  o Increase activity slowly as tolerated, but follow the weight bearing instructions below.   o No driving for 6 weeks or until further direction given by your physician.  You cannot drive while taking narcotics.  o No lifting or carrying greater than 10  lbs. until further directed by your surgeon. o Avoid periods of inactivity such as sitting longer than an hour when not asleep. This helps prevent blood clots.  o You may return to work once you are authorized by your doctor.     WEIGHT BEARING   Weight bearing as tolerated with assist device (walker, cane, etc) as directed, use it as long as suggested by your surgeon or therapist, typically at least 4-6 weeks.   EXERCISES  Results after joint replacement surgery are often greatly improved when you follow the exercise, range of motion and muscle strengthening exercises prescribed by your doctor. Safety measures are also important to protect the joint from further injury. Any time any of these exercises cause you to have increased pain or swelling, decrease what you are doing until you are comfortable again and then slowly increase them. If you have problems or questions, call your caregiver or physical therapist for advice.   Rehabilitation is important following a joint replacement. After just a few days of immobilization, the muscles of the leg can become weakened and shrink (atrophy).  These exercises are designed to build up the tone and strength of the thigh and leg muscles and to improve motion. Often times heat used for twenty to thirty minutes before working out will loosen up your tissues and help with improving the range of motion but do not use heat for the first two  weeks following surgery (sometimes heat can increase post-operative swelling).   These exercises can be done on a training (exercise) mat, on the floor, on a table or on a bed. Use whatever works the best and is most comfortable for you.    Use music or television while you are exercising so that the exercises are a pleasant break in your day. This will make your life better with the exercises acting as a break in your routine that you can look forward to.   Perform all exercises about fifteen times, three times per day or as  directed.  You should exercise both the operative leg and the other leg as well.  Exercises include:    Quad Sets - Tighten up the muscle on the front of the thigh (Quad) and hold for 5-10 seconds.    Straight Leg Raises - With your knee straight (if you were given a brace, keep it on), lift the leg to 60 degrees, hold for 3 seconds, and slowly lower the leg.  Perform this exercise against resistance later as your leg gets stronger.   Leg Slides: Lying on your back, slowly slide your foot toward your buttocks, bending your knee up off the floor (only go as far as is comfortable). Then slowly slide your foot back down until your leg is flat on the floor again.   Angel Wings: Lying on your back spread your legs to the side as far apart as you can without causing discomfort.   Hamstring Strength:  Lying on your back, push your heel against the floor with your leg straight by tightening up the muscles of your buttocks.  Repeat, but this time bend your knee to a comfortable angle, and push your heel against the floor.  You may put a pillow under the heel to make it more comfortable if necessary.   A rehabilitation program following joint replacement surgery can speed recovery and prevent re-injury in the future due to weakened muscles. Contact your doctor or a physical therapist for more information on knee rehabilitation.    CONSTIPATION  Constipation is defined medically as fewer than three stools per week and severe constipation as less than one stool per week.  Even if you have a regular bowel pattern at home, your normal regimen is likely to be disrupted due to multiple reasons following surgery.  Combination of anesthesia, postoperative narcotics, change in appetite and fluid intake all can affect your bowels.   YOU MUST use at least one of the following options; they are listed in order of increasing strength to get the job done.  They are all available over the counter, and you may need to  use some, POSSIBLY even all of these options:    Drink plenty of fluids (prune juice may be helpful) and high fiber foods Colace 100 mg by mouth twice a day  Senokot for constipation as directed and as needed Dulcolax (bisacodyl), take with full glass of water  Miralax (polyethylene glycol) once or twice a day as needed.  If you have tried all these things and are unable to have a bowel movement in the first 3-4 days after surgery call either your surgeon or your primary doctor.    If you experience loose stools or diarrhea, hold the medications until you stool forms back up.  If your symptoms do not get better within 1 week or if they get worse, check with your doctor.  If you experience "the worst abdominal pain ever" or  develop nausea or vomiting, please contact the office immediately for further recommendations for treatment.   ITCHING:  If you experience itching with your medications, try taking only a single pain pill, or even half a pain pill at a time.  You can also use Benadryl over the counter for itching or also to help with sleep.   TED HOSE STOCKINGS:  Use stockings on both legs until for at least 2 weeks or as directed by physician office. They may be removed at night for sleeping.  MEDICATIONS:  See your medication summary on the After Visit Summary that nursing will review with you.  You may have some home medications which will be placed on hold until you complete the course of blood thinner medication.  It is important for you to complete the blood thinner medication as prescribed.  PRECAUTIONS:  If you experience chest pain or shortness of breath - call 911 immediately for transfer to the hospital emergency department.   If you develop a fever greater that 101 F, purulent drainage from wound, increased redness or drainage from wound, foul odor from the wound/dressing, or calf pain - CONTACT YOUR SURGEON.                                                   FOLLOW-UP  APPOINTMENTS:  If you do not already have a post-op appointment, please call the office for an appointment to be seen by your surgeon.  Guidelines for how soon to be seen are listed in your After Visit Summary, but are typically between 1-4 weeks after surgery.  OTHER INSTRUCTIONS:     MAKE SURE YOU:   Understand these instructions.   Get help right away if you are not doing well or get worse.    Thank you for letting us be a part of your medical care team.  It is a privilege we respect greatly.  We hope these instructions will help you stay on track for a fast and full recovery!

## 2017-09-30 NOTE — Anesthesia Preprocedure Evaluation (Addendum)
Anesthesia Evaluation  Patient identified by MRN, date of birth, ID band Patient awake    Reviewed: Allergy & Precautions, NPO status , Patient's Chart, lab work & pertinent test results  Airway Mallampati: II  TM Distance: >3 FB Neck ROM: Full    Dental no notable dental hx. (+) Edentulous Lower, Edentulous Upper   Pulmonary COPD, former smoker,    Pulmonary exam normal breath sounds clear to auscultation       Cardiovascular hypertension, Pt. on medications Normal cardiovascular exam Rhythm:Regular Rate:Normal  Echo 05/23/15:  - Left ventricle: The cavity size was normal. Wall thickness was normal. Systolic function was normal. The estimated ejection fraction was in the range of 55% to 60%.      Neuro/Psych Anxiety CVA, No Residual Symptoms    GI/Hepatic Neg liver ROS, GERD  Medicated,  Endo/Other  negative endocrine ROS  Renal/GU      Musculoskeletal  (+) Arthritis , Osteoarthritis,    Abdominal (+) + obese,   Peds  Hematology  (+) anemia ,   Anesthesia Other Findings   Reproductive/Obstetrics                            Lab Results  Component Value Date   CREATININE 0.93 09/19/2017   BUN <5 (L) 09/19/2017   NA 141 09/19/2017   K 3.3 (L) 09/19/2017   CL 105 09/19/2017   CO2 25 09/19/2017    Lab Results  Component Value Date   WBC 4.9 09/19/2017   HGB 14.0 09/19/2017   HCT 43.9 09/19/2017   MCV 94.6 09/19/2017   PLT 191 09/19/2017    Anesthesia Physical Anesthesia Plan  ASA: III  Anesthesia Plan: General   Post-op Pain Management:    Induction: Intravenous  PONV Risk Score and Plan: 3 and Treatment may vary due to age or medical condition, Dexamethasone and Ondansetron  Airway Management Planned: Oral ETT  Additional Equipment:   Intra-op Plan:   Post-operative Plan: Extubation in OR  Informed Consent: I have reviewed the patients History and Physical,  chart, labs and discussed the procedure including the risks, benefits and alternatives for the proposed anesthesia with the patient or authorized representative who has indicated his/her understanding and acceptance.   Dental advisory given  Plan Discussed with:   Anesthesia Plan Comments:         Anesthesia Quick Evaluation

## 2017-09-30 NOTE — Op Note (Signed)
09/30/2017  4:20 PM  PATIENT:  [name]    PRE-OPERATIVE DIAGNOSIS:  OA RIGHT HIP  POST-OPERATIVE DIAGNOSIS:  Same  PROCEDURE:  RIGHT HIP IM NAIL REMOVAL WITH CONVERSION TO TOTAL HIP ARTHOPLASTY, anterior approach  SURGEON:  Rajah Lamba, Ernesta Amble, MD  ASSISTANT: Roxan Hockey, PA-C, he was present and scrubbed throughout the case, critical for completion in a timely fashion, and for retraction, instrumentation, and closure.   ANESTHESIA:   gen  PREOPERATIVE INDICATIONS:  [name] is a  77 y.o. female with a diagnosis of OA RIGHT HIP who failed conservative measures and elected for surgical management.    The risks benefits and alternatives were discussed with the patient preoperatively including but not limited to the risks of infection, bleeding, nerve injury, cardiopulmonary complications, the need for revision surgery, among others, and the patient was willing to proceed.  OPERATIVE IMPLANTS: HA fully coated stryker 9 x 13  -2.5 stem, 54 acetabular shell  OPERATIVE FINDINGS: nonunion  BLOOD LOSS: min  COMPLICATIONS: none  TOURNIQUET TIME: none  OPERATIVE PROCEDURE:  Patient was identified in the preoperative holding area and site was marked by me She was transported to the operating theater and placed on the table in supine position taking care to pad all bony prominences. After a preincinduction time out anesthesia was induced. The right lower extremity was prepped and draped in normal sterile fashion and a pre-incision timeout was performed. She received ancef and vanc for preoperative antibiotics.   I made an incision through her previous incisions and attached a removal device to the intertrochanteric nail.  Distally I then removed her distal interlock followed by the lag screws into the femoral head.  I was then able to remove the nail which came out whole.  Next I thoroughly irrigated and closed these wounds.  I then made approach for an anterior total hip  arthroplasty.  Identified the fascia over her tensor muscle incised this and mobilized and retracted her tensor muscle laterally.  I ligated vessels over the anterior capsule and then performed a capsulotomy.  Identified her nonunion site I cleaned her femoral neck cut here with a saw and then removed the femoral head.  I assessed her remaining calcar she essentially had a very short net count at this point.  Due to the transverse distal interlocks I like to the place a long Foley coated H a stem.  I turned my attention to the acetabulum where I removed her labrum sequentially reamed up to a 54 stem and placed a Stryker Tri-Lock cup followed by a 20 mm screw and polyethylene liner use fluoroscopy fluoroscopic guidance to confirm appropriate placement of the cup.  Next I turned my attention of the femur I performed a posterior capsular release to mobilize the femur I then sequentially distally reamed up to a 13 and then sequentially broached to a 9 stem.  I was happy with the fit here I then trialed and placed a -5 femoral neck.  Took multiple x-rays and was happy with the alignment of each of these.  Next I redislocated remove the trials thoroughly irrigated and placed the above-mentioned stem with a -25 femoral neck.  I reduced it took multiple x-rays and was happy with the alignment of all hardware I then thoroughly irrigated her incision and closed this in layers.  She was awoken and taken the PACU in stable condition  POST OPERATIVE PLAN: WBAT, mobilize and chemical dvt px.

## 2017-09-30 NOTE — Interval H&P Note (Signed)
History and Physical Interval Note:  09/30/2017 8:57 AM  [name]  has presented today for surgery, with the diagnosis of OA RIGHT HIP  The various methods of treatment have been discussed with the patient and family. After consideration of risks, benefits and other options for treatment, the patient has consented to  Procedure(s): RIGHT HIP IM NAIL REMOVAL WITH CONVERSION TO TOTAL HIP ARTHOPLASTY (Right) as a surgical intervention .  The patient's history has been reviewed, patient examined, no change in status, stable for surgery.  I have reviewed the patient's chart and labs.  Questions were answered to the patient's satisfaction.     Salima Rumer D

## 2017-09-30 NOTE — Transfer of Care (Signed)
Immediate Anesthesia Transfer of Care Note  Patient: [name]  Procedure(s) Performed: RIGHT HIP IM NAIL REMOVAL WITH CONVERSION TO TOTAL HIP ARTHOPLASTY, anterior approach (Right Hip)  Patient Location: PACU  Anesthesia Type:General  Level of Consciousness: awake, alert  and patient cooperative  Airway & Oxygen Therapy: Patient Spontanous Breathing and Patient connected to nasal cannula oxygen  Post-op Assessment: Report given to RN, Post -op Vital signs reviewed and stable and Patient moving all extremities  Post vital signs: Reviewed and stable  Last Vitals:  Vitals Value Taken Time  BP 101/77 09/30/2017  2:12 PM  Temp    Pulse 84 09/30/2017  2:16 PM  Resp 18 09/30/2017  2:16 PM  SpO2 95 % 09/30/2017  2:16 PM  Vitals shown include unvalidated device data.  Last Pain:  Vitals:   09/30/17 0923  PainSc: 9          Complications: No apparent anesthesia complications

## 2017-10-01 ENCOUNTER — Inpatient Hospital Stay: Payer: Medicare Other

## 2017-10-01 LAB — BASIC METABOLIC PANEL
Anion gap: 9 (ref 5–15)
BUN: 11 mg/dL (ref 6–20)
CO2: 25 mmol/L (ref 22–32)
Calcium: 8.4 mg/dL — ABNORMAL LOW (ref 8.9–10.3)
Chloride: 106 mmol/L (ref 101–111)
Creatinine, Ser: 1.47 mg/dL — ABNORMAL HIGH (ref 0.44–1.00)
GFR calc Af Amer: 39 mL/min — ABNORMAL LOW (ref >60)
GFR calc non Af Amer: 33 mL/min — ABNORMAL LOW (ref >60)
Glucose, Bld: 133 mg/dL — ABNORMAL HIGH (ref 65–99)
Potassium: 3.7 mmol/L (ref 3.5–5.1)
Sodium: 140 mmol/L (ref 135–145)

## 2017-10-01 LAB — CBC
HCT: 32.1 % — ABNORMAL LOW (ref 36.0–46.0)
Hemoglobin: 9.9 g/dL — ABNORMAL LOW (ref 12.0–15.0)
MCH: 29.4 pg (ref 26.0–34.0)
MCHC: 30.8 g/dL (ref 30.0–36.0)
MCV: 95.3 fL (ref 78.0–100.0)
Platelets: 120 10*3/uL — ABNORMAL LOW (ref 150–400)
RBC: 3.37 MIL/uL — ABNORMAL LOW (ref 3.87–5.11)
RDW: 14.5 % (ref 11.5–15.5)
WBC: 9.7 10*3/uL (ref 4.0–10.5)

## 2017-10-01 MED ORDER — RIVAROXABAN 10 MG PO TABS: 10.0000 mg | ORAL_TABLET | Freq: Every day | ORAL | 0 refills | Status: DC

## 2017-10-01 MED ORDER — FERROUS SULFATE 325 (65 FE) MG PO TABS
325.0000 mg | ORAL_TABLET | Freq: Two times a day (BID) | ORAL | Status: DC
  Administered 2017-10-01 – 2017-10-02 (×4): 325 mg via ORAL
  Filled 2017-10-01 (×4): qty 1

## 2017-10-01 MED ORDER — ORPHENADRINE CITRATE ER 100 MG PO TB12
100.0000 mg | ORAL_TABLET | Freq: Two times a day (BID) | ORAL | Status: DC | PRN
  Filled 2017-10-01: qty 1

## 2017-10-01 MED ORDER — RIVAROXABAN 10 MG PO TABS
10.0000 mg | ORAL_TABLET | Freq: Every day | ORAL | Status: DC
  Administered 2017-10-01: 10 mg via ORAL
  Filled 2017-10-01: qty 1

## 2017-10-01 MED ORDER — IRON 325 (65 FE) MG PO TABS: 1.0000 | ORAL_TABLET | Freq: Two times a day (BID) | ORAL | 0 refills | Status: DC

## 2017-10-01 MED ORDER — ORPHENADRINE CITRATE ER 100 MG PO TB12: 100.0000 mg | ORAL_TABLET | Freq: Two times a day (BID) | ORAL | Status: DC

## 2017-10-01 NOTE — Evaluation (Signed)
Physical Therapy Evaluation Patient Details Name: [name] MRN: [id number] DOB: Oct 19, 1940 Today's Date: 10/01/2017   History of Present Illness  Pt. is a 77 y.o. F with significant PMH of hypertension, hyperlipidemia, CKD, GERD, multiple abdominal surgeries and recent SBO. 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 Currently, s/p R THA with direct anterior approach.  Clinical Impression  Pt is s/p THA resulting in the deficits listed below (see PT Problem List). Patient lives at home with daughter who is available 24/7. Patient independent with mobility prior to admission with use of Rollator. Patient is oriented but has some confusion and decreased short term memory. Patient daughter states patient has cognitive impairments at baseline. Patient presents with functional limitations in mobility and transfers due to decreased RLE strength, range of motion, and diminished balance. Able to take small, shuffling steps with trunk flexed posture using RW from bed to [facility] and then BSC to recliner. Requires up to minimal assist to balance and to maintain upright secondary to right knee buckle. Based on clinical judgment and evaluation, patient is at risk for falls and recommend SNF to maximize functional independence and decrease caregiver burden. However, patient daughter is not agreeable to SNF. Patient daughter will need training on guarding techniques and reviewing safety precautions to prevent falls. Pt will benefit from skilled PT to increase their independence and safety with mobility.     Follow Up Recommendations Follow surgeon's recommendation for DC plan and follow-up therapies;Supervision/Assistance - 24 hour    Equipment Recommendations  None recommended by PT;Other (comment)((pt has all DME))    Recommendations for Other Services       Precautions / Restrictions Precautions Precautions: Fall Restrictions Weight Bearing Restrictions:  Yes RLE Weight Bearing: Weight bearing as tolerated      Mobility  Bed Mobility Overal bed mobility: Needs Assistance Bed Mobility: Supine to Sit     Supine to sit: Supervision        Transfers Overall transfer level: Needs assistance Equipment used: Rolling walker (2 wheeled) Transfers: Sit to/from Stand Sit to Stand: Min guard         General transfer comment: Frequent VC's for hand placement. Min guard for safety with transferring from bed and BSC to RW  Ambulation/Gait Ambulation/Gait assistance: Min assist   Assistive device: Rolling walker (2 wheeled) Gait Pattern/deviations: Step-to pattern;Shuffle;Trunk flexed     General Gait Details: Patient able to take small, pivotal steps from bed to [facility] and then [facility] to recliner with RW. requiring up to min assist due to right knee buckle.   Stairs            Wheelchair Mobility    Modified Rankin (Stroke Patients Only)       Balance Overall balance assessment: Needs assistance Sitting-balance support: No upper extremity supported;Feet unsupported Sitting balance-Leahy Scale: Good     Standing balance support: Bilateral upper extremity supported Standing balance-Leahy Scale: Poor Standing balance comment: reliant on UE support                             Pertinent Vitals/Pain Pain Assessment: Faces Faces Pain Scale: Hurts even more Pain Location: R hip with sitting Pain Descriptors / Indicators: Moaning;Guarding;Grimacing Pain Intervention(s): Limited activity within patient's tolerance;Monitored during session;Premedicated before session    Home Living Family/patient expects to be discharged to:: Private residence Living Arrangements: Children Available Help at Discharge: Family Type of Home:  Mobile home Home Access: Ramped entrance     Home Layout: One level Home Equipment: Shower seat;Bedside commode;Walker - 4 wheels;Walker - 2 wheels      Prior Function Level of  Independence: Independent with assistive device(s)         Comments: Using Rollator for household ambulation.      Hand Dominance   Dominant Hand: Right    Extremity/Trunk Assessment   Upper Extremity Assessment Upper Extremity Assessment: Defer to OT evaluation    Lower Extremity Assessment Lower Extremity Assessment: RLE deficits/detail;LLE deficits/detail RLE Deficits / Details: MMT: R hip flexion 3/5, knee extension 4/5, ankle dorsiflexion 5/5. Denies numbness or tingling LLE Deficits / Details: Grossly 4/5        Communication   Communication: No difficulties  Cognition Arousal/Alertness: Awake/alert Behavior During Therapy: WFL for tasks assessed/performed Overall Cognitive Status: History of cognitive impairments - at baseline                                 General Comments: Patient daughter reports cognition is at baseline. 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."      General Comments General comments (skin integrity, edema, etc.): Written HEP given    Exercises Total Joint Exercises Gluteal Sets: 10 reps;Seated Long Arc Quad: 10 reps;Right   Assessment/Plan    PT Assessment Patient needs continued PT services  PT Problem List Decreased range of motion;Decreased strength;Decreased activity tolerance;Decreased balance;Decreased mobility;Decreased coordination;Decreased cognition;Decreased safety awareness;Pain       PT Treatment Interventions DME instruction;Gait training;Stair training;Functional mobility training;Therapeutic activities;Therapeutic exercise;Balance training;Neuromuscular re-education;Patient/family education    PT Goals (Current goals can be found in the Care Plan section)  Acute Rehab PT Goals Patient Stated Goal: go home PT Goal Formulation: With patient Time For Goal Achievement: 10/15/17 Potential to Achieve Goals: Fair    Frequency 7X/week   Barriers to discharge         Co-evaluation               AM-PAC PT "6 Clicks" Daily Activity  Outcome Measure Difficulty turning over in bed (including adjusting bedclothes, sheets and blankets)?: A Little Difficulty moving from lying on back to sitting on the side of the bed? : A Little Difficulty sitting down on and standing up from a chair with arms (e.g., wheelchair, bedside commode, etc,.)?: A Little Help needed moving to and from a bed to chair (including a wheelchair)?: A Little Help needed walking in hospital room?: A Lot Help needed climbing 3-5 steps with a railing? : Total 6 Click Score: 15    End of Session Equipment Utilized During Treatment: Gait belt Activity Tolerance: Patient tolerated treatment well Patient left: in chair;with call bell/phone within reach;with family/visitor present Nurse Communication: Mobility status PT Visit Diagnosis: Unsteadiness on feet (R26.81);Muscle weakness (generalized) (M62.81);Difficulty in walking, not elsewhere classified (R26.2);Pain Pain - Right/Left: Right Pain - part of body: Hip    Time: 2751-7001 PT Time Calculation (min) (ACUTE ONLY): 39 min   Charges:   PT Evaluation $PT Eval Moderate Complexity: 1 Mod PT Treatments $Therapeutic Exercise: 8-22 mins $Therapeutic Activity: 8-22 mins   PT G Codes:        Ellamae Sia, PT, DPT Acute Rehabilitation Services  Pager: [place] 10/01/2017, 11:38 AM

## 2017-10-01 NOTE — Evaluation (Addendum)
Occupational Therapy Evaluation Patient Details Name: [name] MRN: [id number] DOB: 1940/07/13 Today's Date: 10/01/2017    History of Present Illness Pt. is a 77 y.o. F with significant PMH of hypertension, hyperlipidemia, CKD, GERD, multiple abdominal surgeries and recent SBO. 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 Currently, s/p R IM nail removal with conversion to THA with direct anterior approach.   Clinical Impression   PTA, pt reports independence for basic ADL and functional mobility but requiring assistance for IADL from her daughter who lives with her. Pt currently limited by R hip pain but was able to complete LB ADL with mod assist, toilet transfers with min assist, and standing grooming tasks with min guard assist. Initiated education concerning compensatory strategies for LB ADL and safety precautions and pt would benefit from further reinforcement. She would benefit from continued OT services while admitted to improve independence and safety with ADL and functional mobility prior to D/C. Feel pt will need 24 hour hands on assistance for ADL and functional mobility and if daughter unable to provide this level of assistance may need to consider post-acute rehabilitation.     Follow Up Recommendations  Supervision/Assistance - 24 hour;Follow surgeon's recommendation for DC plan and follow-up therapies    Equipment Recommendations  3 in 1 bedside commode    Recommendations for Other Services       Precautions / Restrictions Precautions Precautions: Fall Restrictions Weight Bearing Restrictions: Yes RLE Weight Bearing: Weight bearing as tolerated      Mobility Bed Mobility Overal bed mobility: Needs Assistance Bed Mobility: Supine to Sit     Supine to sit: Supervision        Transfers Overall transfer level: Needs assistance Equipment used: Rolling walker (2 wheeled) Transfers: Sit to/from Stand Sit to  Stand: Min assist         General transfer comment: Min assist to power up to standing.     Balance Overall balance assessment: Needs assistance Sitting-balance support: No upper extremity supported;Feet unsupported Sitting balance-Leahy Scale: Good     Standing balance support: Bilateral upper extremity supported Standing balance-Leahy Scale: Poor Standing balance comment: reliant on UE support                           ADL either performed or assessed with clinical judgement   ADL Overall ADL's : Needs assistance/impaired Eating/Feeding: Set up;Sitting   Grooming: Min guard;Standing   Upper Body Bathing: Set up;Sitting   Lower Body Bathing: Moderate assistance;Sit to/from stand   Upper Body Dressing : Set up;Sitting   Lower Body Dressing: Moderate assistance;Sit to/from stand   Toilet Transfer: Minimal assistance;Ambulation;RW           Functional mobility during ADLs: Minimal assistance;Rolling walker General ADL Comments: Initiated education concerning compensatory strategies for ADL participation with pt requiring min assist for stability and redirection to follow commands.      Vision Patient Visual Report: No change from baseline Vision Assessment?: No apparent visual deficits     Perception     Praxis      Pertinent Vitals/Pain Pain Assessment: Faces Faces Pain Scale: Hurts little more Pain Location: R hip with sitting Pain Descriptors / Indicators: Moaning;Guarding;Grimacing Pain Intervention(s): Limited activity within patient's tolerance;Monitored during session;Repositioned     Hand Dominance Right   Extremity/Trunk Assessment Upper Extremity Assessment Upper Extremity Assessment: Generalized weakness   Lower Extremity Assessment Lower Extremity Assessment:  Defer to PT evaluation       Communication Communication Communication: No difficulties   Cognition Arousal/Alertness: Awake/alert Behavior During Therapy: WFL for  tasks assessed/performed Overall Cognitive Status: History of cognitive impairments - at baseline                                 General Comments: Pt with decreased cognition at baseline. Daughter not present during session. Noted decreased short-term memory.    General Comments  Pt on 3 L O2 via [place] on my arrival with SpO2 95%. OT removed supplemental O2 with desaturation to 89% during conversation. Re-applied supplemental O2 with SpO2 91-94% thruoghout session.     Exercises     Shoulder Instructions      Home Living Family/patient expects to be discharged to:: Private residence Living Arrangements: Children Available Help at Discharge: Family Type of Home: Mobile home Home Access: Ramped entrance     Home Layout: One level     Bathroom Shower/Tub: Chief Strategy Officer: Standard     Home Equipment: Shower seat;Bedside commode;Walker - 4 wheels;Walker - 2 wheels          Prior Functioning/Environment Level of Independence: Independent with assistive device(s)        Comments: Using Rollator for household ambulation.         OT Problem List: Decreased strength;Decreased range of motion;Decreased activity tolerance;Impaired balance (sitting and/or standing);Decreased safety awareness;Decreased knowledge of use of DME or AE;Decreased knowledge of precautions;Pain      OT Treatment/Interventions: Self-care/ADL training;Therapeutic exercise;Energy conservation;DME and/or AE instruction;Therapeutic activities;Patient/family education;Balance training    OT Goals(Current goals can be found in the care plan section) Acute Rehab OT Goals Patient Stated Goal: go home OT Goal Formulation: With patient Time For Goal Achievement: 10/15/17 Potential to Achieve Goals: Good ADL Goals Pt Will Perform Grooming: with modified independence;standing Pt Will Perform Lower Body Bathing: with supervision;sit to/from stand Pt Will Perform Lower Body  Dressing: with supervision;sit to/from stand Pt Will Transfer to Toilet: with supervision;ambulating;regular height toilet;bedside commode(BSC over toilet) Pt Will Perform Toileting - Clothing Manipulation and hygiene: sit to/from stand;with modified independence Pt Will Perform Tub/Shower Transfer: with min guard assist;Tub transfer;shower seat;rolling walker  OT Frequency: Min 2X/week   Barriers to D/C:            Co-evaluation              AM-PAC PT "6 Clicks" Daily Activity     Outcome Measure Help from another person eating meals?: A Little Help from another person taking care of personal grooming?: A Little Help from another person toileting, which includes using toliet, bedpan, or urinal?: A Little Help from another person bathing (including washing, rinsing, drying)?: A Lot Help from another person to put on and taking off regular upper body clothing?: A Little Help from another person to put on and taking off regular lower body clothing?: A Lot 6 Click Score: 16   End of Session Equipment Utilized During Treatment: Gait belt;Rolling walker Nurse Communication: Mobility status  Activity Tolerance: Patient tolerated treatment well Patient left: in chair;with call bell/phone within reach;with chair alarm set  OT Visit Diagnosis: Other abnormalities of gait and mobility (R26.89);Pain Pain - Right/Left: Right Pain - part of body: Hip                Time: 4540-9811 OT Time Calculation (min): 19 min Charges:  OT General  Charges $OT Visit: 1 Visit OT Evaluation $OT Eval Moderate Complexity: 1 Mod G-Codes:     Doristine Section, MS OTR/L  Pager: [phone]   Siah Kannan A Daxton Nydam 10/01/2017, 1:48 PM

## 2017-10-01 NOTE — Progress Notes (Signed)
As per verbal order from [place], Pt received 55ml bolus of fluids.

## 2017-10-01 NOTE — Progress Notes (Signed)
Physical Therapy Treatment Patient Details Name: [name] MRN: [id number] DOB: 07-12-1940 Today's Date: 10/01/2017    History of Present Illness Pt. is a 77 y.o. F with significant PMH of hypertension, hyperlipidemia, CKD, GERD, multiple abdominal surgeries and recent SBO. 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 Currently, s/p R THA with direct anterior approach.    PT Comments    Patient is progressing very well towards their physical therapy goals with additional session today. Increase in ambulation distance to 30 feet with RW, minimal assistance for steadying, and a chair follow. Patient slightly tremulous throughout gait but no knee buckle noted this session. Patient placed back on supplemental O2 at end of session due to desaturation to 88% on RA. Rest of treatment focused on right proximal hip strengthening.     Follow Up Recommendations  Follow surgeon's recommendation for DC plan and follow-up therapies;Supervision/Assistance - 24 hour     Equipment Recommendations  None recommended by PT;Other (comment)((pt has all DME))    Recommendations for Other Services       Precautions / Restrictions Precautions Precautions: Fall Restrictions Weight Bearing Restrictions: Yes RLE Weight Bearing: Weight bearing as tolerated    Mobility  Bed Mobility Overal bed mobility: Needs Assistance Bed Mobility: Sit to Supine     Supine to sit: Supervision        Transfers Overall transfer level: Needs assistance Equipment used: Rolling walker (2 wheeled) Transfers: Sit to/from Stand Sit to Stand: Min guard         General transfer comment: Less VC's needed for safe hand placement this session  Ambulation/Gait Ambulation/Gait assistance: Min assist Ambulation Distance (Feet): 30 Feet Assistive device: Rolling walker (2 wheeled) Gait Pattern/deviations: Shuffle;Trunk flexed;Step-through pattern;Narrow base of support      General Gait Details: patient slightly tremulousness during ambulation.    Stairs             Wheelchair Mobility    Modified Rankin (Stroke Patients Only)       Balance Overall balance assessment: Needs assistance Sitting-balance support: No upper extremity supported;Feet unsupported Sitting balance-Leahy Scale: Good     Standing balance support: Bilateral upper extremity supported Standing balance-Leahy Scale: Poor Standing balance comment: reliant on UE support                            Cognition Arousal/Alertness: Awake/alert Behavior During Therapy: WFL for tasks assessed/performed Overall Cognitive Status: History of cognitive impairments - at baseline                                 General Comments: Patient daughter reports cognition is at baseline. 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 Removed supplemental O2 and able to maintain > 90% during session. However, upon return to bed, desaturation to 88%. Reapplied 1L supplemental O2 with SpO2 increase to 93%,      Pertinent Vitals/Pain Pain Assessment: Faces Faces Pain Scale: Hurts little more Pain Location: R hip with sitting Pain Descriptors / Indicators: Guarding;Grimacing Pain Intervention(s): Limited activity within patient's tolerance;Monitored during session  Home Living Family/patient expects to be discharged to:: Private residence Living Arrangements: Children Available Help at Discharge: Family Type of Home: Mobile home Home Access: Ramped entrance   [place]: One level Home Equipment: Shower seat;Bedside commode;Walker - 4 wheels;Walker - 2  wheels      Prior Function Level of Independence: Independent with assistive device(s)      Comments: Using Rollator for household ambulation.    PT Goals (current goals can now be found in the care plan section) Acute Rehab PT Goals Patient Stated Goal: go home PT Goal Formulation: With patient Time For Goal Achievement: 10/15/17 Potential to Achieve Goals: Fair Progress towards PT goals: Progressing toward goals    Frequency    7X/week      PT Plan Current plan remains appropriate    Co-evaluation              AM-PAC PT "6 Clicks" Daily Activity  Outcome Measure  Difficulty turning over in bed (including adjusting bedclothes, sheets and blankets)?: A Little Difficulty moving from lying on back to sitting on the side of the bed? : A Little Difficulty sitting down on and standing up from a chair with arms (e.g., wheelchair, bedside commode, etc,.)?: A Little Help needed moving to and from a bed to chair (including a wheelchair)?: A Little Help needed walking in hospital room?: A Lot Help needed climbing 3-5 steps with a railing? : Total 6 Click Score: 15    End of Session Equipment Utilized During Treatment: Gait belt Activity Tolerance: Patient tolerated treatment well Patient left: with call bell/phone within reach;with family/visitor present;in bed;with bed alarm set Nurse Communication: Mobility status PT Visit Diagnosis: Unsteadiness on feet (R26.81);Muscle weakness (generalized) (M62.81);Difficulty in walking, not elsewhere classified (R26.2);Pain Pain - Right/Left: Right Pain - part of body: Hip     Time: 6599-3570 PT Time Calculation (min) (ACUTE ONLY): 31 min  Charges:  $Gait Training: 8-22 mins $Therapeutic Exercise: 8-22 mins                    G Codes:      Ellamae Sia, PT, DPT Acute Rehabilitation Services  Pager: [phone]    Willy Eddy 10/01/2017, 4:04 PM

## 2017-10-01 NOTE — Progress Notes (Addendum)
Update: Cr. Elevated.  Continue to suspect pre-renal et.   Will continue to give IVFs, encourage PO intake, and follow labs.  Continue inpatient for now to follow ABLA, AKI, and continue pain control and mobilization with therapies with the goal of returning home in care of her family w/ HHPT.  Prudencio Burly III, PA-C 10/01/2017 12:39 PM    Subjective: Patient reports pain as mild to moderate.  Tolerating some-appetite good.  No CP, SOB.  Not yet OOB.  No lightheadedness or dizziness.    Objective:   VITALS:   Vitals:   09/30/17 1723 09/30/17 2105 10/01/17 0101 10/01/17 0455  BP: (!) 118/98 106/72 116/80 114/70  Pulse: 66 75 69 65  Resp: 12 16 16 16   Temp: 97.8 F (36.6 C) 97.8 F (36.6 C) 98.4 F (36.9 C) 98.4 F (36.9 C)  TempSrc: Oral Oral Oral Oral  SpO2: 92% 95% 99% 98%   CBC Latest Ref Rng & Units 10/01/2017 09/30/2017 09/19/2017  WBC 4.0 - 10.5 K/uL 9.7 12.9(H) 4.9  Hemoglobin 12.0 - 15.0 g/dL 9.9(L) 10.8(L) 14.0  Hematocrit 36.0 - 46.0 % 32.1(L) 34.0(L) 43.9  Platelets 150 - 400 K/uL 120(L) 153 191   BMP Latest Ref Rng & Units 09/30/2017 09/19/2017 08/25/2017  Glucose 65 - 99 mg/dL - 92 104(H)  BUN 6 - 20 mg/dL - <5(L) <5(L)  Creatinine 0.44 - 1.00 mg/dL 1.15(H) 0.93 0.83  BUN/Creat Ratio 12 - 28 - - -  Sodium 135 - 145 mmol/L - 141 141  Potassium 3.5 - 5.1 mmol/L - 3.3(L) 3.6  Chloride 101 - 111 mmol/L - 105 108  CO2 22 - 32 mmol/L - 25 24  Calcium 8.9 - 10.3 mg/dL - 9.5 9.1   Intake/Output      05/07 0701 - 05/08 0700 05/08 0701 - 05/09 0700   I.V. 1200    IV Piggyback 850    Total Intake 2050    Urine 440    Blood 500    Total Output 940    Net +1110           Physical Exam: General: NAD.  Supine in bed.  Sleepy but conversant. Resp: No increased wob Cardio: regular rate and rhythm ABD soft Neurologically intact MSK RLE: Neurovascularly intact Sensation intact distally Feet warm Dorsiflexion/Plantar flexion intact Mild expected  surrounding bruising Incisions: dressing C/D/I with scant drainage   Assessment / Plan: 1 Day Post-Op  S/P Procedure(s) (LRB): RIGHT HIP IM NAIL REMOVAL WITH CONVERSION TO TOTAL HIP ARTHOPLASTY, anterior approach (Right) by Dr. Ernesta Amble. 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  Platelets 120.  No lightheadedness or dizziness.  No sign of active bleeding.  Likely with some dilutional component.    Start Fe Supplementation.  Will check CBC tomorrow a.m. if still inpatient.  Check H&H if she becomes symptomatic during the day.  Urinary retention: History of CKD.  Given ABLA and decreased fluids overnight, she is likely dry.    Plan for 500 mL LR bolus this a.m. and follow.  Bladder scan and consider repeat bolus if she continues to retain fluids.  Check BMP  Primary osteoarthritis, status post total hip arthroplasty Advance diet Up with therapy Incentive Spirometry Elevate and Apply ice   Weight Bearing: Weight Bearing as Tolerated (WBAT) RLE Dressings: Maintain Mepilex.   VTE prophylaxis: Lovenox, SCDs, ambulation Dispo: Home when voiding spontaneously, pain is controlled, and mobilizing.   Prudencio Burly III, PA-C 10/01/2017, 7:37 AM

## 2017-10-02 LAB — PREPARE RBC (CROSSMATCH)

## 2017-10-02 LAB — BASIC METABOLIC PANEL
Anion gap: 6 (ref 5–15)
BUN: 17 mg/dL (ref 6–20)
CO2: 29 mmol/L (ref 22–32)
Calcium: 8.8 mg/dL — ABNORMAL LOW (ref 8.9–10.3)
Chloride: 109 mmol/L (ref 101–111)
Creatinine, Ser: 1.32 mg/dL — ABNORMAL HIGH (ref 0.44–1.00)
GFR calc Af Amer: 44 mL/min — ABNORMAL LOW (ref >60)
GFR calc non Af Amer: 38 mL/min — ABNORMAL LOW (ref >60)
Glucose, Bld: 116 mg/dL — ABNORMAL HIGH (ref 65–99)
Potassium: 3.7 mmol/L (ref 3.5–5.1)
Sodium: 144 mmol/L (ref 135–145)

## 2017-10-02 LAB — CBC
HCT: 26.5 % — ABNORMAL LOW (ref 36.0–46.0)
Hemoglobin: 8.3 g/dL — ABNORMAL LOW (ref 12.0–15.0)
MCH: 29.4 pg (ref 26.0–34.0)
MCHC: 31.3 g/dL (ref 30.0–36.0)
MCV: 94 fL (ref 78.0–100.0)
Platelets: 104 10*3/uL — ABNORMAL LOW (ref 150–400)
RBC: 2.82 MIL/uL — ABNORMAL LOW (ref 3.87–5.11)
RDW: 14.6 % (ref 11.5–15.5)
WBC: 12.6 10*3/uL — ABNORMAL HIGH (ref 4.0–10.5)

## 2017-10-02 MED ORDER — SODIUM CHLORIDE 0.9 % IV SOLN
Freq: Once | INTRAVENOUS | Status: AC
  Administered 2017-10-02: 11:00:00 via INTRAVENOUS

## 2017-10-02 MED ORDER — ASPIRIN EC 81 MG PO TBEC: 81.0000 mg | DELAYED_RELEASE_TABLET | Freq: Two times a day (BID) | ORAL | 0 refills | Status: DC

## 2017-10-02 MED ORDER — ASPIRIN EC 81 MG PO TBEC
81.0000 mg | DELAYED_RELEASE_TABLET | Freq: Two times a day (BID) | ORAL | Status: DC
  Administered 2017-10-02: 81 mg via ORAL
  Filled 2017-10-02: qty 1

## 2017-10-02 MED ORDER — FUROSEMIDE 10 MG/ML IJ SOLN
20.0000 mg | Freq: Once | INTRAMUSCULAR | Status: AC
  Administered 2017-10-02: 20 mg via INTRAVENOUS
  Filled 2017-10-02: qty 2

## 2017-10-02 NOTE — Progress Notes (Signed)
Pt discharged to home in stable condition.  All discharge instructions and prescriptions reviewed with and given to pt.  All belongings taken by pt's daughter.  Pt transported via wheelchair with transporters x 2.  AKingBSNRN

## 2017-10-02 NOTE — Progress Notes (Signed)
Physical Therapy Treatment Patient Details Name: [name] MRN: [id number] DOB: 1940-06-02 Today's Date: 10/02/2017    History of Present Illness Pt. is a 77 y.o. F with significant PMH of hypertension, hyperlipidemia, CKD, GERD, multiple abdominal surgeries and recent SBO. 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 Currently, s/p R THA with direct anterior approach.    PT Comments    Pt lethargic on arrival and unable to stay alert during session.  Began session with supine exercises in efforts to allow patient to wake up before functional mobility. Pt performed therapeutic exercises and functional mobility during session.   When progressing gait after 10 feet of gait training patient buckled with B LEs and required total assistance to regain balance and prevent fall from occurring.  Pt based on presentation today, patient is at increased risk for falling if she returns home today.    Follow Up Recommendations  Follow surgeon's recommendation for DC plan and follow-up therapies;Supervision/Assistance - 24 hour     Equipment Recommendations  None recommended by PT;Other (comment)(has all DME)    Recommendations for Other Services       Precautions / Restrictions Precautions Precautions: Fall Restrictions Weight Bearing Restrictions: Yes RLE Weight Bearing: Weight bearing as tolerated    Mobility  Bed Mobility Overal bed mobility: Needs Assistance Bed Mobility: Supine to Sit     Supine to sit: Supervision     General bed mobility comments: Cues for hand placement, required increased time and effort to achieve sitting edge of bed.  Pt on RA and desaturated to 83% moving to the edge of the bed.    Transfers Overall transfer level: Needs assistance Equipment used: Rolling walker (2 wheeled) Transfers: Sit to/from Stand Sit to Stand: Min assist;Mod assist         General transfer comment: Cues for hand placement to push  from seated surface.  Pt during second trial presents with wobbly knees.    Ambulation/Gait Ambulation/Gait assistance: Min assist;Total assist(initially min assistance but required total assistance after B knees buckled in standing.  ) Ambulation Distance (Feet): 10 Feet Assistive device: Rolling walker (2 wheeled) Gait Pattern/deviations: Step-through pattern;Trunk flexed;Shuffle;Narrow base of support     General Gait Details: Cues for sequencing.  Pt removed hands from RW to reach for her knee. B knees buckled and required total assistance to regain balance.  Pt placed in chair close by and +2 used for repeated sit to stand to return to recliner.  Pt on 3L and desturated to 85% and required 4L to improve O2 sats.     Stairs             Wheelchair Mobility    Modified Rankin (Stroke Patients Only)       Balance Overall balance assessment: Needs assistance   Sitting balance-Leahy Scale: Good       Standing balance-Leahy Scale: Poor                              Cognition Arousal/Alertness: Awake/alert Behavior During Therapy: WFL for tasks assessed/performed Overall Cognitive Status: History of cognitive impairments - at baseline                                 General Comments: Patient daughter reports cognition is at baseline. States patient does have memory impairments due to  two previous "infarcts."      Exercises Total Joint Exercises Ankle Circles/Pumps: AROM;Both;20 reps;Supine Quad Sets: AROM;10 reps;Supine;Right Short Arc Quad: AROM;Right;10 reps;Supine Heel Slides: AAROM;Right;10 reps;Supine Hip ABduction/ADduction: Right;10 reps;AAROM;Supine    General Comments        Pertinent Vitals/Pain Pain Assessment: Faces Faces Pain Scale: Hurts even more Pain Location: R hip with sitting Pain Descriptors / Indicators: Guarding;Grimacing Pain Intervention(s): Monitored during session;Repositioned    Home Living                       Prior Function            PT Goals (current goals can now be found in the care plan section) Acute Rehab PT Goals Patient Stated Goal: go home Potential to Achieve Goals: Fair Progress towards PT goals: Progressing toward goals    Frequency    7X/week      PT Plan Current plan remains appropriate    Co-evaluation PT/OT/SLP Co-Evaluation/Treatment: Yes Reason for Co-Treatment: Complexity of the patient's impairments (multi-system involvement);For patient/therapist safety PT goals addressed during session: Mobility/safety with mobility OT goals addressed during session: ADL's and self-care      AM-PAC PT "6 Clicks" Daily Activity  Outcome Measure                   End of Session Equipment Utilized During Treatment: Gait belt Activity Tolerance: Patient tolerated treatment well Patient left: with call bell/phone within reach;with family/visitor present;in bed;with bed alarm set Nurse Communication: Mobility status PT Visit Diagnosis: Unsteadiness on feet (R26.81);Muscle weakness (generalized) (M62.81);Difficulty in walking, not elsewhere classified (R26.2);Pain Pain - Right/Left: Right Pain - part of body: Hip     Time: 2010-0712 PT Time Calculation (min) (ACUTE ONLY): 33 min  Charges:  $Therapeutic Activity: 8-22 mins                    G Codes:       Governor Rooks, PTA pager [phone]    Cristela Blue 10/02/2017, 10:28 AM

## 2017-10-02 NOTE — Progress Notes (Signed)
Occupational Therapy Treatment Patient Details Name: [name] MRN: [id number] DOB: 1941-02-10 Today's Date: 10/02/2017    History of present illness Pt. is a 77 y.o. F with significant PMH of hypertension, hyperlipidemia, CKD, GERD, multiple abdominal surgeries and recent SBO. 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 Currently, s/p R THA with direct anterior approach.   OT comments  Pt very wobbly this day and will need significant A with ADL activity   Follow Up Recommendations  Supervision/Assistance - 24 hour;Follow surgeon's recommendation for DC plan and follow-up therapies;SNF    Equipment Recommendations  3 in 1 bedside commode    Recommendations for Other Services      Precautions / Restrictions Precautions Precautions: Fall Restrictions Weight Bearing Restrictions: Yes RLE Weight Bearing: Weight bearing as tolerated       Mobility Bed Mobility Overal bed mobility: Needs Assistance Bed Mobility: Supine to Sit     Supine to sit: Supervision     General bed mobility comments: pt OOB with PT  Transfers Overall transfer level: Needs assistance Equipment used: Rolling walker (2 wheeled) Transfers: Sit to/from Stand Sit to Stand: Min assist;Mod assist         General transfer comment: Cues for hand placement to push from seated surface.  Pt during second trial presents with wobbly knees.      Balance Overall balance assessment: Needs assistance   Sitting balance-Leahy Scale: Good       Standing balance-Leahy Scale: Poor                             ADL either performed or assessed with clinical judgement   ADL Overall ADL's : Needs assistance/impaired                     Lower Body Dressing: Moderate assistance;Sit to/from stand Lower Body Dressing Details (indicate cue type and reason): pt knees buckled in standing with PT, then with OT pts knees very unsteady with sit to  stand Toilet Transfer: Minimal assistance;Ambulation;RW;Moderate assistance Toilet Transfer Details (indicate cue type and reason): pt knees VERY wobbly in standing.  Needed more A this day Toileting- Clothing Manipulation and Hygiene: Moderate assistance;Sit to/from stand;Cueing for sequencing;Cueing for safety         General ADL Comments: pts knees buckled with PT.  Upon OT entering room pt in chair in which PT had to pull up chair.  Pt with very wobbly knees with OT and transitioned to chair     Vision Patient Visual Report: No change from baseline            Cognition Arousal/Alertness: Awake/alert Behavior During Therapy: WFL for tasks assessed/performed Overall Cognitive Status: History of cognitive impairments - at baseline                                 General Comments: Patient daughter reports cognition is at baseline. 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."              General Comments      Pertinent Vitals/ Pain       Pain Assessment: Faces Faces Pain Scale: Hurts even more Pain Location: R hip with sitting Pain Descriptors / Indicators: Guarding;Grimacing Pain Intervention(s): Limited activity within patient's tolerance;Repositioned;Monitored during session  Frequency  Min 2X/week        Progress Toward Goals  OT Goals(current goals can now be found in the care plan section)  Progress towards OT goals: Progressing toward goals  Acute Rehab OT Goals Patient Stated Goal: go home  Plan Discharge plan needs to be updated    Co-evaluation      Reason for Co-Treatment: Complexity of the patient's impairments (multi-system involvement);For patient/therapist safety PT goals addressed during session: Mobility/safety with mobility OT goals addressed during session: ADL's and self-care      AM-PAC PT "6 Clicks" Daily Activity     Outcome Measure   Help from another person eating meals?:  A Little Help from another person taking care of personal grooming?: A Little Help from another person toileting, which includes using toliet, bedpan, or urinal?: A Little Help from another person bathing (including washing, rinsing, drying)?: A Lot Help from another person to put on and taking off regular upper body clothing?: A Little Help from another person to put on and taking off regular lower body clothing?: A Lot 6 Click Score: 16    End of Session Equipment Utilized During Treatment: Gait belt;Rolling walker  OT Visit Diagnosis: Other abnormalities of gait and mobility (R26.89);Pain Pain - Right/Left: Right Pain - part of body: Hip   Activity Tolerance Patient tolerated treatment well   Patient Left in chair;with call bell/phone within reach;with chair alarm set   Nurse Communication Mobility status        Time: 1000-1008 OT Time Calculation (min): 8 min  Charges: OT General Charges $OT Visit: 1 Visit OT Treatments $Self Care/Home Management : 8-22 mins  [place], [place]   Payton Mccallum D 10/02/2017, 11:02 AM

## 2017-10-02 NOTE — Progress Notes (Signed)
Physical Therapy Treatment Patient Details Name: [name] MRN: [id number] DOB: 01-14-1941 Today's Date: 10/02/2017    History of Present Illness Pt. is a 77 y.o. F with significant PMH of hypertension, hyperlipidemia, CKD, GERD, multiple abdominal surgeries and recent SBO. 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 Currently, s/p R THA with direct anterior approach.    PT Comments    Pt performed increased activity but remains to require min to mod assistance to maintain safety when on her feet.  Pt performed gait training and functional training and she is limited due to pain.  Pt's pain meds held due to lethargy this am but she remains lethargic and slurring words when talking.  Pt remains motivated to return home.  She remains a high risk for falls based on presentation.      Follow Up Recommendations  Follow surgeon's recommendation for DC plan and follow-up therapies;Supervision/Assistance - 24 hour     Equipment Recommendations  None recommended by PT;Other (comment)(has all DME)    Recommendations for Other Services       Precautions / Restrictions Precautions Precautions: Fall Restrictions Weight Bearing Restrictions: Yes RLE Weight Bearing: Weight bearing as tolerated    Mobility  Bed Mobility Overal bed mobility: Needs Assistance Bed Mobility: Supine to Sit     Supine to sit: Supervision     General bed mobility comments: pt OOB with PT  Transfers Overall transfer level: Needs assistance Equipment used: Rolling walker (2 wheeled) Transfers: Sit to/from Stand Sit to Stand: Mod assist         General transfer comment: Pt remains to require cues for hand placement and assistance to boost into standing at patient remains lethargic and limited due to pain and lethargic presentation.    Ambulation/Gait Ambulation/Gait assistance: Mod assist Ambulation Distance (Feet): 60 Feet Assistive device: Rolling  walker (2 wheeled) Gait Pattern/deviations: Step-through pattern;Trunk flexed;Shuffle;Narrow base of support     General Gait Details: Cues for sequencing.  Pt remains to present with poor hand placement despite cues. B knees continue to buckle but she did not require as much assistance to correct. Pt on RA and post session sats 91% on RA.   Stairs             Wheelchair Mobility    Modified Rankin (Stroke Patients Only)       Balance Overall balance assessment: Needs assistance   Sitting balance-Leahy Scale: Good       Standing balance-Leahy Scale: Poor Standing balance comment: reliant on UE support                            Cognition Arousal/Alertness: Awake/alert Behavior During Therapy: WFL for tasks assessed/performed Overall Cognitive Status: History of cognitive impairments - at baseline                                 General Comments: Patient daughter reports cognition is at baseline. 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."      Exercises      General Comments        Pertinent Vitals/Pain Pain Assessment: 0-10 Pain Score: 10-Worst pain ever Pain Location: R hip and knee with mobility Pain Descriptors / Indicators: Guarding;Grimacing Pain Intervention(s): Monitored during session;Repositioned    Home Living  Prior Function            PT Goals (current goals can now be found in the care plan section) Acute Rehab PT Goals Patient Stated Goal: go home Potential to Achieve Goals: Fair Progress towards PT goals: Progressing toward goals    Frequency    7X/week      PT Plan Current plan remains appropriate    Co-evaluation              AM-PAC PT "6 Clicks" Daily Activity  Outcome Measure  Difficulty turning over in bed (including adjusting bedclothes, sheets and blankets)?: Unable Difficulty moving from lying on back to sitting on the  side of the bed? : Unable Difficulty sitting down on and standing up from a chair with arms (e.g., wheelchair, bedside commode, etc,.)?: Unable Help needed moving to and from a bed to chair (including a wheelchair)?: A Lot Help needed walking in hospital room?: A Lot Help needed climbing 3-5 steps with a railing? : A Lot 6 Click Score: 9    End of Session Equipment Utilized During Treatment: Gait belt Activity Tolerance: Patient tolerated treatment well Patient left: with call bell/phone within reach;with family/visitor present;in chair;with chair alarm set Nurse Communication: Mobility status PT Visit Diagnosis: Unsteadiness on feet (R26.81);Muscle weakness (generalized) (M62.81);Difficulty in walking, not elsewhere classified (R26.2);Pain Pain - Right/Left: Right Pain - part of body: Hip     Time: 5997-7414 PT Time Calculation (min) (ACUTE ONLY): 19 min  Charges:  $Gait Training: 8-22 mins                    G Codes:       Governor Rooks, PTA pager [phone]    Cristela Blue 10/02/2017, 3:46 PM

## 2017-10-02 NOTE — Discharge Summary (Signed)
Discharge Summary  Patient ID: [name] MRN: [id number] DOB/AGE: Aug 20, 1940 77 y.o.  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 Anemia   . B12 deficiency   . Bacteremia 10/07/2014  . Bladder incontinence   . Blind left eye   . Cancer ([place])    skin cancer   . Cataract   . Cervical spine fracture ([place])   . Chest pain 07/29/2013  . Compression fracture    C7,  upper T spine -compression fx  . COPD (chronic obstructive pulmonary disease) ([place])   . DDD (degenerative disc disease), lumbar   . Depression    major  . Diffuse myofascial pain syndrome 03/24/2015  . Dyspnea    at times- when activty  . Fever 10/05/2014  . GERD (gastroesophageal reflux disease)   . Hiatal hernia   . Hyperlipidemia   . Hypertension   . 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 Macular degeneration   . Osteoarthritis   . Osteoporosis   . Pneumonia    years ago  . Reactive airway disease   . Rupture of bowel ([place])   . Sepsis ([place]) 10/08/2014  . Stroke [facility])    "mini stroke"- balance and memory issue  . Stroke ([place])   . Wrist pain, acute 09/10/2012    Surgeries: Procedure(s): RIGHT HIP IM NAIL REMOVAL WITH CONVERSION TO TOTAL HIP ARTHOPLASTY, anterior approach on 09/30/2017   Consultants (if any):   Discharged Condition: Improved  Hospital Course: [name] is an 77 y.o. 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    She was given perioperative antibiotics:  Anti-infectives (From admission, onward)   Start     Dose/Rate Route Frequency Ordered Stop   09/30/17 2130  vancomycin (VANCOCIN) IVPB 1000 mg/200 mL premix     1,000 mg 200 mL/hr over 60 Minutes Intravenous Every 12 hours 09/30/17 1752 10/01/17 0031   09/30/17 1100  vancomycin (VANCOCIN) powder 1,000 mg  Status:  Discontinued     1,000 mg Other To Surgery 09/29/17 1043 09/30/17 1734   09/30/17 1000  clindamycin (CLEOCIN) 900 mg in dextrose 5 % 100 mL IVPB  Status:  Discontinued     900 mg 200 mL/hr over 30 Minutes Intravenous To Surgery 09/29/17 1055 09/29/17 1102   09/30/17 1000  clindamycin (CLEOCIN) IVPB 900 mg  Status:  Discontinued     900 mg 100 mL/hr over 30 Minutes Intravenous To ShortStay Surgical 09/29/17 1103 09/30/17 1734   09/30/17 0900  vancomycin (VANCOCIN) 1,500 mg in sodium chloride 0.9 % 500 mL IVPB     1,500 mg 250 mL/hr over 120 Minutes Intravenous To Surgery 09/29/17 1055 09/30/17 1137    .  She was given sequential compression devices, early ambulation, and Xarelto then aspirin for DVT prophylaxis after discussion with the patient and her daughter.  She benefited maximally from the hospital stay and there  were no complications.   Her hospitalization prolonged due to the nature of her surgery cessation we followed her hemoglobin plan to transfuse 1 unit on 10/02/2017.  AKI/urinary retention postop day 1 improved with IV fluids.   Recent vital signs:  Vitals:   10/01/17 1405 10/01/17 2047  BP: 127/68 132/65  Pulse: 82 82  Resp: 17 18  Temp: 98.4 F (36.9 C) 98.7 F (37.1 C)  SpO2: 99% 98%    Recent laboratory studies:  Lab Results  Component Value Date   HGB 8.3 (L) 10/02/2017   HGB 9.9 (L) 10/01/2017   HGB 10.8 (L) 09/30/2017   Lab Results  Component Value Date   WBC 12.6 (H) 10/02/2017   PLT 104 (L) 10/02/2017   Lab Results  Component Value Date   INR 1.00  09/13/2016   Lab Results  Component Value Date   NA 144 10/02/2017   K 3.7 10/02/2017   CL 109 10/02/2017   CO2 29 10/02/2017   BUN 17 10/02/2017   CREATININE 1.32 (H) 10/02/2017   GLUCOSE 116 (H) 10/02/2017    Discharge Medications:   Allergies as of 10/02/2017      Reactions   Ampicillin Anaphylaxis, Nausea Only, Swelling   SEVERE HEADACHE MUSCLE CRAMPS ANGIOEDEMA THRUSH PATIENT HAS HAD A PCN REACTION WITH IMMEDIATE RASH, FACIAL/TONGUE/THROAT SWELLING, SOB, OR LIGHTHEADEDNESS WITH HYPOTENSION:  #  #  YES  #  #  Has patient had a PCN reaction causing severe rash involving mucus membranes or skin necrosis: No Has patient had a PCN reaction that required hospitalization:No Has patient had a PCN reaction occurring within the last 10 years: No.   Ampicillin-sulbactam Sodium Anaphylaxis, Other (See Comments)   SEVERE HEADACHE MUSCLE CRAMPS ANGIOEDEMA THRUSH PATIENT HAS HAD A PCN REACTION WITH IMMEDIATE RASH, FACIAL/TONGUE/THROAT SWELLING, SOB, OR LIGHTHEADEDNESS WITH HYPOTENSION: # # YES # # Has patient had a PCN reaction causing severe rash involving mucus membranes or skin necrosis: No Has patient had a PCN reaction that required hospitalization:No Has patient had a PCN reaction occurring within the last 10 years: No   Ambien [zolpidem Tartrate] Nausea And Vomiting, Other (See Comments)   HALLUCINATIONS SWEATING   Flexeril [cyclobenzaprine] Other (See Comments)   EXTRAPYRAMIDAL MOVEMENT INVOLUNTARY MUSCLE JERKING   Tape Itching, Rash, Other (See Comments)   Paper tape only. Adhesive tape=itching/burning/rash   Toradol [ketorolac Tromethamine] Nausea Only, Other (See Comments)   HEADACHE  BACKACHE   Sulfamethoxazole-trimethoprim    UNSPECIFIED REACTION    Cephalexin Rash, Other (See Comments)   HEADACHES   Percocet [oxycodone-acetaminophen] Itching, Rash   Percocet is OK, "itches but takes Benadryl"   Tramadol Rash, Other (See Comments)   HEADACHE       Medication  List    STOP taking these medications   HYDROcodone-acetaminophen 5-325 MG tablet Commonly known as:  NORCO/VICODIN     TAKE these medications   acetaminophen 500 MG tablet Commonly known as:  TYLENOL Take 2 tablets (1,000 mg total) by mouth every 8 (eight) hours for 14 days. For Pain. What changed:    how much to take  when to take this  reasons to take this  additional instructions   albuterol 108 (90 Base) MCG/ACT inhaler Commonly known as:  PROVENTIL HFA;VENTOLIN HFA Inhale 2 puffs into the lungs every 6 (six) hours as needed for wheezing or shortness of breath.   albuterol 1.25 MG/3ML nebulizer solution Commonly known as:  ACCUNEB Take 1 ampule by nebulization every 6 (six) hours as  needed for wheezing.   alendronate 70 MG tablet Commonly known as:  FOSAMAX Take 70 mg by mouth every Monday. Take with a full glass of water on an empty stomach.   ALPRAZolam 1 MG tablet Commonly known as:  XANAX Take 1 mg by mouth at bedtime.   amLODipine 5 MG tablet Commonly known as:  NORVASC Take 5 mg by mouth daily.   aspirin EC 81 MG tablet Take 1 tablet (81 mg total) by mouth 2 (two) times daily. For DVT prophylaxis   BIOFREEZE EX Apply 1 application topically daily as needed (muscle pain).   CALCIUM/D3 ADULT GUMMIES PO Take 1 tablet by mouth daily.   citalopram 20 MG tablet Commonly known as:  CELEXA Take 30 mg by mouth daily.   docusate sodium 100 MG capsule Commonly known as:  COLACE Take 300 mg by mouth 2 (two) times daily as needed for moderate constipation (take 3 capsules scheduled EVERY morning & 3 capsules in the evening as needed for constipation). What changed:  Another medication with the same name was added. Make sure you understand how and when to take each.   docusate sodium 100 MG capsule Commonly known as:  COLACE Take 1 capsule (100 mg total) by mouth 2 (two) times daily. To prevent constipation while taking pain medication. What changed:  You  were already taking a medication with the same name, and this prescription was added. Make sure you understand how and when to take each.   esomeprazole 40 MG capsule Commonly known as:  NEXIUM Take 40 mg by mouth 2 (two) times daily before a meal.   gabapentin 400 MG capsule Commonly known as:  NEURONTIN Take 1 capsule (400 mg total) by mouth at bedtime. What changed:  when to take this   GINKGO BILOBA EXTRACT PO Take 1 tablet by mouth daily.   Iron 325 (65 Fe) MG Tabs Take 1 tablet (325 mg total) by mouth 2 (two) times daily.   Krill Oil 350 MG Caps Take 350 mg by mouth daily.   lactose free nutrition Liqd Take 237 mLs by mouth daily.   LINZESS 290 MCG Caps capsule Generic drug:  linaclotide Take 290 mcg by mouth daily.   lovastatin 10 MG tablet Commonly known as:  MEVACOR Take 10 mg by mouth daily.   meloxicam 15 MG tablet Commonly known as:  MOBIC Take 15 mg by mouth daily.   MULTIVITAMIN GUMMIES ADULT PO Take 1 tablet by mouth daily.   naloxegol oxalate 12.5 MG Tabs tablet Commonly known as:  MOVANTIK Take 1 tablet (12.5 mg total) by mouth daily. To prevent constipation when taking narcotic pain medicine.   ondansetron 8 MG disintegrating tablet Commonly known as:  ZOFRAN-ODT Take 8 mg by mouth every 8 (eight) hours as needed for nausea or vomiting.   orphenadrine 100 MG tablet Commonly known as:  NORFLEX Take 1 tablet (100 mg total) by mouth 2 (two) times daily. What changed:    when to take this  reasons to take this   oxyCODONE 5 MG immediate release tablet Commonly known as:  ROXICODONE Take 1 tablet (5 mg total) by mouth every 4 (four) hours as needed for breakthrough pain.   polyethylene glycol packet Commonly known as:  MIRALAX / GLYCOLAX Take 17 g by mouth daily as needed (for constipation.).   PROBIOTIC PO Take 1 capsule by mouth daily.   senna 8.6 MG Tabs tablet Commonly known as:  SENOKOT Take 2 tablets by mouth daily as  needed (for  constipation.).   traZODone 100 MG tablet Commonly known as:  DESYREL Take 100 mg by mouth at bedtime.       Diagnostic Studies: Dg C-arm 1-60 Min  Result Date: 09/30/2017 CLINICAL DATA:  77 year old female post total right hip replacement. Initial encounter. EXAM: OPERATIVE right HIP (WITH PELVIS IF PERFORMED) 2 VIEWS Fluoroscopic time: 1 minutes and 28 seconds. TECHNIQUE: Fluoroscopic spot image(s) were submitted for interpretation post-operatively. COMPARISON:  09/13/2016 FINDINGS: Removal of right femoral dynamic sliding type screw and placement of right total hip. On frontal projection, prosthesis appears in satisfactory position without complication noted. IMPRESSION: Post total right hip replacement. Electronically Signed   By: Genia Del M.D.   On: 09/30/2017 13:13   Dg C-arm 1-60 Min  Result Date: 09/30/2017 CLINICAL DATA:  77 year old female post total right hip replacement. Initial encounter. EXAM: OPERATIVE right HIP (WITH PELVIS IF PERFORMED) 2 VIEWS Fluoroscopic time: 1 minutes and 28 seconds. TECHNIQUE: Fluoroscopic spot image(s) were submitted for interpretation post-operatively. COMPARISON:  09/13/2016 FINDINGS: Removal of right femoral dynamic sliding type screw and placement of right total hip. On frontal projection, prosthesis appears in satisfactory position without complication noted. IMPRESSION: Post total right hip replacement. Electronically Signed   By: Genia Del M.D.   On: 09/30/2017 13:13   Dg Hip Port Unilat With Pelvis 1v Right  Result Date: 09/30/2017 CLINICAL DATA:  History of total hip arthroplasty. EXAM: DG HIP (WITH OR WITHOUT PELVIS) 1V PORT RIGHT COMPARISON:  None. FINDINGS: Patient has undergone RIGHT total hip arthroplasty. No adverse features. IMPRESSION: Satisfactory position and alignment. Electronically Signed   By: Staci Righter M.D.   On: 09/30/2017 20:04   Dg Hip Operative Unilat W Or W/o Pelvis Right  Result Date: 09/30/2017 CLINICAL DATA:   77 year old female post total right hip replacement. Initial encounter. EXAM: OPERATIVE right HIP (WITH PELVIS IF PERFORMED) 2 VIEWS Fluoroscopic time: 1 minutes and 28 seconds. TECHNIQUE: Fluoroscopic spot image(s) were submitted for interpretation post-operatively. COMPARISON:  09/13/2016 FINDINGS: Removal of right femoral dynamic sliding type screw and placement of right total hip. On frontal projection, prosthesis appears in satisfactory position without complication noted. IMPRESSION: Post total right hip replacement. Electronically Signed   By: Genia Del M.D.   On: 09/30/2017 13:13    Disposition: Discharge disposition: 01-Home or Self Care       Discharge Instructions    Discharge patient   Complete by:  As directed    After transfusion 1 unit and therapy.   Discharge disposition:  01-Home or Self Care   Discharge patient date:  10/02/2017      Follow-up Information    Renette Butters, MD Follow up.   Specialty:  Orthopedic Surgery Contact information: [address] [place] [address] [phone]            Signed: Prudencio Burly III PA-C 10/02/2017, 7:26 AM

## 2017-10-02 NOTE — Care Management Note (Signed)
Case Management Note  Patient Details  Name: [name] MRN: [id number] Date of Birth: Aug 30, 1940  Subjective/Objective:   77 yr old female s/p right total hip arthroplasty.                 Action/Plan:  Patient was preoperatively setup with [facility]. Will have support at discharge.    Expected Discharge Date:  10/02/17               Expected Discharge Plan:  [name]  In-House Referral:  NA  Discharge planning Services  CM Consult  Post Acute Care Choice:  Durable Medical Equipment, Home Health Choice offered to:  Patient  DME Arranged:  3-N-1, Walker rolling DME Agency:  [employer]  HH Arranged:  PT [place]:  [facility] [employer]  Status of Service:  Completed, signed off  If discussed at [employer], dates discussed:    Additional Comments:  Ninfa Meeker, RN 10/02/2017, 11:00 AM

## 2017-10-02 NOTE — Progress Notes (Signed)
Subjective: Patient reports pain as mild to moderate, controlled.  Tolerating diet.  No CP, SOB.  OOB with therapy-improved during p.m. sessions.  Daughter and patient believe she is safe to return home later today.  Mobilization improved now as compared to before surgery.  Some fatigue/weakness.  Discussed DVT prophylaxis again with the patient and her daughter.  I discussed risk of bleeding and DVT.  They would prefer aspirin 81 mg twice daily.  Objective:   VITALS:   Vitals:   10/01/17 0101 10/01/17 0455 10/01/17 1405 10/01/17 2047  BP: 116/80 114/70 127/68 132/65  Pulse: 69 65 82 82  Resp: 16 16 17 18   Temp: 44.0 F (36.9 C) 98.4 F (36.9 C) 98.4 F (36.9 C) 98.7 F (37.1 C)  TempSrc: Oral Oral Oral Oral  SpO2: 99% 98% 99% 98%   CBC Latest Ref Rng & Units 10/02/2017 10/01/2017 09/30/2017  WBC 4.0 - 10.5 K/uL 12.6(H) 9.7 12.9(H)  Hemoglobin 12.0 - 15.0 g/dL 8.3(L) 9.9(L) 10.8(L)  Hematocrit 36.0 - 46.0 % 26.5(L) 32.1(L) 34.0(L)  Platelets 150 - 400 K/uL 104(L) 120(L) 153   BMP Latest Ref Rng & Units 10/02/2017 10/01/2017 09/30/2017  Glucose 65 - 99 mg/dL 116(H) 133(H) -  BUN 6 - 20 mg/dL 17 11 -  Creatinine 0.44 - 1.00 mg/dL 1.32(H) 1.47(H) 1.15(H)  BUN/Creat Ratio 12 - 28 - - -  Sodium 135 - 145 mmol/L 144 140 -  Potassium 3.5 - 5.1 mmol/L 3.7 3.7 -  Chloride 101 - 111 mmol/L 109 106 -  CO2 22 - 32 mmol/L 29 25 -  Calcium 8.9 - 10.3 mg/dL 8.8(L) 8.4(L) -   Intake/Output      05/08 0701 - 05/09 0700 05/09 0701 - 05/10 0700   P.O. 720    I.V. 2248.3    IV Piggyback     Total Intake 2968.3    Urine     Blood     Total Output     Net +2968.3         Urine Occurrence 5 x    Stool Occurrence 2 x      Physical Exam: General: NAD.  Supine in bed.  Sleepy but conversant. Resp: No increased wob Cardio: regular rate and rhythm ABD soft Neurologically intact MSK RLE: Neurovascularly intact Sensation intact distally Feet warm Dorsiflexion/Plantar flexion intact Mild  expected surrounding bruising Incisions: dressing C/D/I with mild bloody drainage.  Anterior hip dressing changed.   Assessment / Plan: 2 Days Post-Op  S/P Procedure(s) (LRB): RIGHT HIP IM NAIL REMOVAL WITH CONVERSION TO TOTAL HIP ARTHOPLASTY, anterior approach (Right) by Dr. Ernesta Amble. 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  Likely with some dilutional component with IVFs for AKI.    Hgb 14 pre op > 10.8 > 9.9 > 8.3.  Platelets 120 > 102.  No sign of active bleeding.    Plan to transfuse 1 unit today.  Continue Fe Supplementation.     Check H&H if she becomes symptomatic during the day.  Urinary retention / AKI: She is now urinating spontaneously.  Urine clear yellow.  Cr improved.  Primary osteoarthritis, status post total hip arthroplasty Up with therapy Incentive Spirometry Elevate and Apply ice    Weight Bearing: Weight Bearing as Tolerated (WBAT) RLE Dressings: Maintain Mepilex.   VTE prophylaxis: Aspirin, SCDs, ambulation Dispo: Home after transfusion and therapy today.   [name] III, PA-C 10/02/2017, 7:18 AM

## 2017-10-03 ENCOUNTER — Encounter (HOSPITAL_COMMUNITY): Payer: Self-pay | Admitting: Orthopedic Surgery

## 2017-10-03 LAB — TYPE AND SCREEN
ABO/RH(D): AB POS
Antibody Screen: NEGATIVE
Unit division: 0

## 2017-10-03 LAB — BPAM RBC
Blood Product Expiration Date: 201905312359
ISSUE DATE / TIME: 201905091050
Unit Type and Rh: 8400

## 2017-10-22 ENCOUNTER — Emergency Department
Admission: EM | Admit: 2017-10-22 | Discharge: 2017-10-22 | Disposition: A | Discharge status: Home / Self Care | Payer: Medicare Other | Attending: Emergency Medicine | Admitting: Emergency Medicine

## 2017-10-22 ENCOUNTER — Encounter: Payer: Self-pay | Admitting: Emergency Medicine

## 2017-10-22 DIAGNOSIS — I129 Hypertensive chronic kidney disease with stage 1 through stage 4 chronic kidney disease, or unspecified chronic kidney disease: Secondary | ICD-10-CM | POA: Insufficient documentation

## 2017-10-22 DIAGNOSIS — K5900 Constipation, unspecified: Secondary | ICD-10-CM | POA: Diagnosis present

## 2017-10-22 DIAGNOSIS — Z8673 Personal history of transient ischemic attack (TIA), and cerebral infarction without residual deficits: Secondary | ICD-10-CM | POA: Diagnosis not present

## 2017-10-22 DIAGNOSIS — Z96651 Presence of right artificial knee joint: Secondary | ICD-10-CM | POA: Insufficient documentation

## 2017-10-22 DIAGNOSIS — Z96641 Presence of right artificial hip joint: Secondary | ICD-10-CM | POA: Insufficient documentation

## 2017-10-22 DIAGNOSIS — Z87891 Personal history of nicotine dependence: Secondary | ICD-10-CM | POA: Diagnosis not present

## 2017-10-22 DIAGNOSIS — Z96652 Presence of left artificial knee joint: Secondary | ICD-10-CM | POA: Diagnosis not present

## 2017-10-22 DIAGNOSIS — Z79899 Other long term (current) drug therapy: Secondary | ICD-10-CM | POA: Diagnosis not present

## 2017-10-22 DIAGNOSIS — K5641 Fecal impaction: Secondary | ICD-10-CM | POA: Diagnosis not present

## 2017-10-22 DIAGNOSIS — Z7982 Long term (current) use of aspirin: Secondary | ICD-10-CM | POA: Diagnosis not present

## 2017-10-22 DIAGNOSIS — N183 Chronic kidney disease, stage 3 (moderate): Secondary | ICD-10-CM | POA: Diagnosis not present

## 2017-10-22 DIAGNOSIS — J449 Chronic obstructive pulmonary disease, unspecified: Secondary | ICD-10-CM | POA: Insufficient documentation

## 2017-10-22 LAB — CBC
HCT: 36.9 % (ref 35.0–47.0)
Hemoglobin: 12.3 g/dL (ref 12.0–16.0)
MCH: 30.4 pg (ref 26.0–34.0)
MCHC: 33.3 g/dL (ref 32.0–36.0)
MCV: 91.3 fL (ref 80.0–100.0)
Platelets: 381 10*3/uL (ref 150–440)
RBC: 4.04 MIL/uL (ref 3.80–5.20)
RDW: 15 % — ABNORMAL HIGH (ref 11.5–14.5)
WBC: 7.8 10*3/uL (ref 3.6–11.0)

## 2017-10-22 LAB — COMPREHENSIVE METABOLIC PANEL
ALT: 23 U/L (ref 14–54)
AST: 62 U/L — ABNORMAL HIGH (ref 15–41)
Albumin: 3.5 g/dL (ref 3.5–5.0)
Alkaline Phosphatase: 121 U/L (ref 38–126)
Anion gap: 8 (ref 5–15)
BUN: 10 mg/dL (ref 6–20)
CO2: 25 mmol/L (ref 22–32)
Calcium: 9.5 mg/dL (ref 8.9–10.3)
Chloride: 105 mmol/L (ref 101–111)
Creatinine, Ser: 1.14 mg/dL — ABNORMAL HIGH (ref 0.44–1.00)
GFR calc Af Amer: 53 mL/min — ABNORMAL LOW (ref >60)
GFR calc non Af Amer: 46 mL/min — ABNORMAL LOW (ref >60)
Glucose, Bld: 107 mg/dL — ABNORMAL HIGH (ref 65–99)
Potassium: 3.9 mmol/L (ref 3.5–5.1)
Sodium: 138 mmol/L (ref 135–145)
Total Bilirubin: 0.7 mg/dL (ref 0.3–1.2)
Total Protein: 7.8 g/dL (ref 6.5–8.1)

## 2017-10-22 LAB — LIPASE, BLOOD: Lipase: 19 U/L (ref 11–51)

## 2017-10-22 MED ORDER — LIDOCAINE HCL URETHRAL/MUCOSAL 2 % EX GEL
1.0000 "application " | Freq: Once | CUTANEOUS | Status: AC
  Administered 2017-10-22: 1 via TOPICAL

## 2017-10-22 MED ORDER — LIDOCAINE HCL URETHRAL/MUCOSAL 2 % EX GEL
CUTANEOUS | Status: AC
  Administered 2017-10-22: 1 via TOPICAL
  Filled 2017-10-22: qty 10

## 2017-10-22 NOTE — ED Provider Notes (Signed)
[facility] Emergency Department Provider Note   ____________________________________________    I have reviewed the triage vital signs and the nursing notes.   HISTORY  Chief Complaint Constipation     HPI [name] is a 77 y.o. female who presents with complaints of constipation.  Patient recently had a replacement, has been on narcotics for pain.  She reports of the last 3 days she has not been able to have a bowel movement, she reports a sensation of a large "ball in her rectum".  Denies abdominal pain.  No nausea or vomiting.  Has tried MiraLAX, enemas without success.  Past Medical History:  Diagnosis Date  . Anemia   . B12 deficiency   . Bacteremia 10/07/2014  . Bladder incontinence   . Blind left eye   . Cancer ([place])    skin cancer   . Cataract   . Cervical spine fracture ([place])   . Chest pain 07/29/2013  . Compression fracture    C7,  upper T spine -compression fx  . COPD (chronic obstructive pulmonary disease) ([place])   . DDD (degenerative disc disease), lumbar   . Depression    major  . Diffuse myofascial pain syndrome 03/24/2015  . Dyspnea    at times- when activty  . Fever 10/05/2014  . GERD (gastroesophageal reflux disease)   . Hiatal hernia   . Hyperlipidemia   . Hypertension   . 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 Macular degeneration   . Osteoarthritis   . Osteoporosis   . Pneumonia    years ago  . Reactive airway disease   . Rupture of bowel ([place])   . Sepsis ([place]) 10/08/2014  . Stroke [facility])    "mini stroke"- balance and memory issue  . Stroke ([place])   . Wrist pain, acute 09/10/2012    Patient Active Problem List   Diagnosis Date Noted  . Primary osteoarthritis of hip 09/30/2017  . History of small bowel obstruction 09/10/2017  . 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 SBO (small bowel obstruction) ([place]) 08/24/2017  . Abnormal x-ray of pelvis 08/13/2017  . Other specified  dorsopathies, sacral and sacrococcygeal region 08/04/2017  . Spondylosis without myelopathy or radiculopathy, lumbosacral region 07/17/2017  . Non-traumatic compression fracture of T3 thoracic vertebra, sequela 07/02/2017  . High risk medication use 07/02/2017  . At high risk for falls 07/02/2017  . Chronic sacroiliac joint pain (Right) 07/02/2017  . CKD (chronic kidney disease) stage 3, GFR 30-59 ml/min (HCC) 04/23/2017  . Chronic knee pain s/p total knee replacement (TKR) (Right) 04/02/2017  . DDD (degenerative disc disease), lumbar 03/05/2017  . Chronic knee pain (Bilateral) (R>L) 03/05/2017  . Osteoarthritis 03/05/2017  . Chronic musculoskeletal pain 03/05/2017  . Neurogenic pain 03/05/2017  . Chronic myofascial pain 02/12/2017  . Lumbar facet syndrome (Bilateral) (L>R) 02/12/2017  . Long term prescription benzodiazepine use 02/12/2017  . Opiate use 02/12/2017  . Disorder of skeletal system 02/12/2017  . Pharmacologic therapy 02/12/2017  . Problems influencing health status 02/12/2017  . 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 NSAID long-term use 02/12/2017  . DDD (degenerative disc disease), cervical 11/11/2016  . Lumbar facet arthropathy (Bilateral) 11/11/2016  . 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 CAFL (chronic airflow limitation) ([place]) 11/07/2016  . Depression, major, in partial remission ([place]) 11/07/2016  . 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 Bed wetting 11/07/2016  . Long term current use of  opiate analgesic 11/07/2016  . Long term prescription opiate use 11/07/2016  . Chronic pain syndrome 11/07/2016  . Chronic neck pain (Secondary Area of Pain) (Bilateral) (L>R) 11/07/2016  . Chronic hip pain (Right) 11/07/2016  . Rib pain 11/07/2016  . Age-related osteoporosis with current pathological fracture with routine healing 09/20/2016  . History of stroke 09/20/2016  . Major depression in remission ([place])  09/20/2016  . Hip fracture ([place]) 09/13/2016  . Intertrochanteric fracture of femur, sequela (Right) 09/13/2016  . Fall   . Chronic shoulder pain (Bilateral) 08/12/2016  . Hx of total knee replacement (Bilateral) 04/04/2016  . Chronic shoulder pain (Left) 02/28/2016  . Hoarseness 01/12/2016  . Pharyngoesophageal dysphagia 01/12/2016  . Chronic low back pain [facility]) (Bilateral) (L>R) 10/26/2015  . 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 UTI (urinary tract infection) 06/25/2015  . Syncope, non cardiac 05/22/2015  . Leukopenia 10/07/2014  . Thrombocytopenia ([place]) 10/07/2014  . Hypoxia 10/05/2014  . Anxiety 06/15/2014  . Apoplectic vertigo 06/15/2014  . Duodenogastric reflux 06/15/2014  . Benign essential HTN 06/15/2014  . Infection of the upper respiratory tract 06/15/2014  . Hypercholesteremia 07/30/2013  . HTN (hypertension) 07/30/2013  . GERD (gastroesophageal reflux disease) 07/30/2013  . Dizziness and giddiness 06/18/2013  . Memory loss 05/03/2013  . Chronic knee pain (Primary Area of Pain) (Right) 09/10/2012    Past Surgical History:  Procedure Laterality Date  . ABDOMINAL HYSTERECTOMY    . ABDOMINAL SURGERY    . APPENDECTOMY    . BLADDER SURGERY    . CHOLECYSTECTOMY    . COLON SURGERY    . COLOSTOMY REVERSAL    . EYE SURGERY     cataracts  . HIP SURGERY Right 08/2016  . INTRAMEDULLARY (IM) NAIL INTERTROCHANTERIC Right 09/13/2016   Procedure: INTRAMEDULLARY (IM) NAIL INTERTROCHANTRIC RIGHT;  Surgeon: Leandrew Koyanagi, MD;  Location: [facility];  Service: Orthopedics;  Laterality: Right;  . JOINT REPLACEMENT Bilateral   . KNEE SURGERY    . OSTOMY    . RECTOCELE REPAIR    . TOE SURGERY Right    3rd  . TOTAL HIP ARTHROPLASTY Right 09/30/2017   Procedure: RIGHT HIP IM NAIL REMOVAL WITH CONVERSION TO TOTAL HIP ARTHOPLASTY, anterior approach;  Surgeon: Renette Butters, MD;  Location: [facility];   Service: Orthopedics;  Laterality: Right;    Prior to Admission medications   Medication Sig Start Date End Date Taking? Authorizing Provider  albuterol (ACCUNEB) 1.25 MG/3ML nebulizer solution Take 1 ampule by nebulization every 6 (six) hours as needed for wheezing.    [provider]  albuterol (PROVENTIL HFA;VENTOLIN HFA) 108 (90 BASE) MCG/ACT inhaler Inhale 2 puffs into the lungs every 6 (six) hours as needed for wheezing or shortness of breath.     [provider]  alendronate (FOSAMAX) 70 MG tablet Take 70 mg by mouth every Monday. Take with a full glass of water on an empty stomach.    [provider]  ALPRAZolam Duanne Moron) 1 MG tablet Take 1 mg by mouth at bedtime.  02/03/17   [provider]  amLODipine (NORVASC) 5 MG tablet Take 5 mg by mouth daily.  04/06/13   [provider]  aspirin EC 81 MG tablet Take 1 tablet (81 mg total) by mouth 2 (two) times daily. For DVT prophylaxis 10/02/17   Prudencio Burly III, PA-C  Calcium-Phosphorus-Vitamin D (CALCIUM/D3 ADULT GUMMIES PO) Take 1 tablet by  mouth daily.    [provider]  citalopram (CELEXA) 20 MG tablet Take 30 mg by mouth daily.  02/27/17   [provider]  docusate sodium (COLACE) 100 MG capsule Take 300 mg by mouth 2 (two) times daily as needed for moderate constipation (take 3 capsules scheduled EVERY morning & 3 capsules in the evening as needed for constipation).     [provider]  docusate sodium (COLACE) 100 MG capsule Take 1 capsule (100 mg total) by mouth 2 (two) times daily. To prevent constipation while taking pain medication. 09/30/17   Martensen, Charna Elizabeth III, PA-C  esomeprazole (NEXIUM) 40 MG capsule Take 40 mg by mouth 2 (two) times daily before a meal.     [provider]  Ferrous Sulfate (IRON) 325 (65 Fe) MG TABS Take 1 tablet (325 mg total) by mouth 2 (two) times daily. 10/01/17   Prudencio Burly III, PA-C  gabapentin  (NEURONTIN) 400 MG capsule Take 1 capsule (400 mg total) by mouth at bedtime. Patient taking differently: Take 400 mg by mouth daily.  09/16/17 12/15/17  Vevelyn Francois, NP  GINKGO BILOBA EXTRACT PO Take 1 tablet by mouth daily.    [provider]  Javier Docker Oil 350 MG CAPS Take 350 mg by mouth daily.    [provider]  lactose free nutrition (BOOST) LIQD Take 237 mLs by mouth daily.    [provider]  linaclotide (LINZESS) 290 MCG CAPS capsule Take 290 mcg by mouth daily.    [provider]  lovastatin (MEVACOR) 10 MG tablet Take 10 mg by mouth daily. 09/22/14   [provider]  meloxicam (MOBIC) 15 MG tablet Take 15 mg by mouth daily.  05/23/16   [provider]  Menthol, Topical Analgesic, (BIOFREEZE EX) Apply 1 application topically daily as needed (muscle pain).    [provider]  Multiple Vitamins-Minerals (MULTIVITAMIN GUMMIES ADULT PO) Take 1 tablet by mouth daily.    [provider]  naloxegol oxalate (MOVANTIK) 12.5 MG TABS tablet Take 1 tablet (12.5 mg total) by mouth daily. To prevent constipation when taking narcotic pain medicine. 09/30/17   Martensen, Charna Elizabeth III, PA-C  ondansetron (ZOFRAN-ODT) 8 MG disintegrating tablet Take 8 mg by mouth every 8 (eight) hours as needed for nausea or vomiting.    [provider]  orphenadrine (NORFLEX) 100 MG tablet Take 1 tablet (100 mg total) by mouth 2 (two) times daily. Patient taking differently: Take 100 mg by mouth 2 (two) times daily as needed for muscle spasms.  09/16/17   Vevelyn Francois, NP  oxyCODONE (ROXICODONE) 5 MG immediate release tablet Take 1 tablet (5 mg total) by mouth every 4 (four) hours as needed for breakthrough pain. 09/30/17 10/30/17  Prudencio Burly III, PA-C  polyethylene glycol (MIRALAX / GLYCOLAX) packet Take 17 g by mouth daily as needed (for constipation.).     [provider]  Probiotic Product (PROBIOTIC PO) Take 1 capsule by  mouth daily.    [provider]  senna (SENOKOT) 8.6 MG TABS tablet Take 2 tablets by mouth daily as needed (for constipation.).     [provider]  traZODone (DESYREL) 100 MG tablet Take 100 mg by mouth at bedtime.     [provider]     Allergies Ampicillin; Ampicillin-sulbactam sodium; Ambien [zolpidem tartrate]; Flexeril [cyclobenzaprine]; Tape; Toradol [ketorolac tromethamine]; Sulfamethoxazole-trimethoprim; Cephalexin; Percocet [oxycodone-acetaminophen]; and Tramadol  Family History  Problem Relation Age of Onset  . Heart  failure Mother   . Heart attack Father     Social History Social History   Tobacco Use  . Smoking status: Former Smoker    Packs/day: 1.50    Years: 30.00    Pack years: 45.00    Last attempt to quit: 2013    Years since quitting: 6.4  . Smokeless tobacco: Never Used  Substance Use Topics  . Alcohol use: No    Alcohol/week: 0.0 oz  . Drug use: No    Review of Systems  Constitutional: No fever/chills Eyes: No visual changes.  ENT: No sore throat. Cardiovascular: Denies chest pain. Respiratory: Denies shortness of breath. Gastrointestinal: No abdominal pain.  Constipation as above Genitourinary: Negative for dysuria. Musculoskeletal: Right hip pain improving Skin: Some redness around incision site, managed by orthopedist Neurological: Negative for headaches    ____________________________________________   PHYSICAL EXAM:  VITAL SIGNS: ED Triage Vitals  Enc Vitals Group     BP 10/22/17 1230 (!) 144/80     Pulse Rate 10/22/17 1230 (!) 121     Resp 10/22/17 1230 20     Temp 10/22/17 1230 98.8 F (37.1 C)     Temp Source 10/22/17 1230 Oral     SpO2 10/22/17 1230 95 %     Weight 10/22/17 1231 84.4 kg (186 lb)     Height 10/22/17 1231 1.626 m (5\' 4" )     Head Circumference --      Peak Flow --      Pain Score 10/22/17 1231 8     Pain Loc --      Pain Edu? --      Excl. in [place]? --     Constitutional:  Alert and oriented. No acute distress. Pleasant and interactive Eyes: Conjunctivae are normal.   .Mouth/Throat: Mucous membranes are moist.    Cardiovascular: Normal rate, regular rhythm. Grossly normal heart sounds.  Good peripheral circulation. Respiratory: Normal respiratory effort.  No retractions. Lungs CTAB. Gastrointestinal: Soft and nontender. No distention.    Musculoskeletal:  Warm and well perfused Neurologic:  Normal speech and language. No gross focal neurologic deficits are appreciated.  Skin:  Skin is warm, dry and intact. No rash noted. Psychiatric: Mood and affect are normal. Speech and behavior are normal.  ____________________________________________   LABS (all labs ordered are listed, but only abnormal results are displayed)  Labs Reviewed  COMPREHENSIVE METABOLIC PANEL - Abnormal; Notable for the following components:      Result Value   Glucose, Bld 107 (*)    Creatinine, Ser 1.14 (*)    AST 62 (*)    GFR calc non Af Amer 46 (*)    GFR calc Af Amer 53 (*)    All other components within normal limits  CBC - Abnormal; Notable for the following components:   RDW 15.0 (*)    All other components within normal limits  LIPASE, BLOOD  URINALYSIS, COMPLETE (UACMP) WITH MICROSCOPIC   ____________________________________________  EKG  None ____________________________________________  RADIOLOGY  None ____________________________________________   PROCEDURES  Procedure(s) performed: yes  ------------------------------------------------------------------------------------------------------------------- Fecal Disimpaction Procedure Note:  Performed by me:  Patient placed in the lateral recumbent position with knees drawn towards chest. Nurse present for patient support. Large amount of hard brown stool removed. No complications during  procedure.   ------------------------------------------------------------------------------------------------------------------    Procedures   Critical Care performed:No ____________________________________________   INITIAL IMPRESSION / ASSESSMENT AND PLAN / ED COURSE  Pertinent labs & imaging results that were available during my care  of the patient were reviewed by me and considered in my medical decision making (see chart for details).  Patient history consistent with fecal impaction, disimpacted by me with removal of hard brown stool, patient felt significantly better after procedure.  Recommend laxatives, increase fiber, increase fluids    ____________________________________________   FINAL CLINICAL IMPRESSION(S) / ED DIAGNOSES  Final diagnoses:  Fecal impaction in rectum [facility])        Note:  This document was prepared using Dragon voice recognition software and may include unintentional dictation errors.    Lavonia Drafts, MD 10/22/17 [phone]

## 2017-10-22 NOTE — ED Notes (Signed)
Pt unable to urinate at this time, given specimen cup for when is able to void and sent back out to the lobby.

## 2017-10-22 NOTE — ED Notes (Signed)
Pt ambulatory with walker to toilet to attempt to defecate.

## 2017-10-22 NOTE — ED Triage Notes (Signed)
Pt comes into the ED via POV c/o constipation.  Patient had hip replacement 2 weeks ago and had a partial blockage approximately 3 weeks prior to the hip replacement.  Patient currently taking oxycodone for the hip and it has progressed the constipation.  Patient has even and unlabored respirations at this time.  She has tried stool softeners, milk of mag, linzess and miralax with no relief.  Patient has redness around the surgical site as well, but is already being treated with doxycycline.  Patient has no drainage from the surgical site at this time and it is dry at this time.

## 2017-10-22 NOTE — ED Notes (Signed)
First Nurse Note: pt here via EMS from home with c/o constipation for 3 days now, status post hip surgery to remove rods 2.5 weeks ago, pt is on oxycodone, heard a "pop" in right thigh this am while dressing, did not cause a fall. Pt appears in NAD at this time.

## 2017-10-22 NOTE — ED Notes (Addendum)
Recent R hip surgery. Has been taking oxycodone twice a day since surgery. Takes a stool softener every day. This AM did a fleet enema, has done milk of magnesia. Last BM was 3 days ago.  States few weeks before hip surgery had a "partial blockage." states they think it was bowel obstruction.   Family states has dealt with constipation "all her life." hx hemorrhoid surgery. Has had a colostomy bag x 3 months before.

## 2017-10-26 ENCOUNTER — Emergency Department (HOSPITAL_COMMUNITY): Payer: Medicare Other

## 2017-10-26 ENCOUNTER — Inpatient Hospital Stay (HOSPITAL_COMMUNITY)
Admission: EM | Admit: 2017-10-26 | Discharge: 2017-10-30 | DRG: 389 | Disposition: A | Discharge status: Home Health | Payer: Medicare Other | Attending: Internal Medicine | Admitting: Internal Medicine

## 2017-10-26 ENCOUNTER — Encounter (HOSPITAL_COMMUNITY): Payer: Self-pay

## 2017-10-26 DIAGNOSIS — M1990 Unspecified osteoarthritis, unspecified site: Secondary | ICD-10-CM | POA: Diagnosis present

## 2017-10-26 DIAGNOSIS — Z791 Long term (current) use of non-steroidal anti-inflammatories (NSAID): Secondary | ICD-10-CM

## 2017-10-26 DIAGNOSIS — D631 Anemia in chronic kidney disease: Secondary | ICD-10-CM | POA: Diagnosis present

## 2017-10-26 DIAGNOSIS — Z96641 Presence of right artificial hip joint: Secondary | ICD-10-CM | POA: Diagnosis present

## 2017-10-26 DIAGNOSIS — T40605A Adverse effect of unspecified narcotics, initial encounter: Secondary | ICD-10-CM | POA: Diagnosis present

## 2017-10-26 DIAGNOSIS — R109 Unspecified abdominal pain: Secondary | ICD-10-CM | POA: Diagnosis not present

## 2017-10-26 DIAGNOSIS — F329 Major depressive disorder, single episode, unspecified: Secondary | ICD-10-CM | POA: Diagnosis present

## 2017-10-26 DIAGNOSIS — Z87891 Personal history of nicotine dependence: Secondary | ICD-10-CM

## 2017-10-26 DIAGNOSIS — K567 Ileus, unspecified: Secondary | ICD-10-CM | POA: Diagnosis not present

## 2017-10-26 DIAGNOSIS — Z888 Allergy status to other drugs, medicaments and biological substances status: Secondary | ICD-10-CM

## 2017-10-26 DIAGNOSIS — L0390 Cellulitis, unspecified: Secondary | ICD-10-CM | POA: Diagnosis present

## 2017-10-26 DIAGNOSIS — R140 Abdominal distension (gaseous): Secondary | ICD-10-CM

## 2017-10-26 DIAGNOSIS — Z880 Allergy status to penicillin: Secondary | ICD-10-CM

## 2017-10-26 DIAGNOSIS — J449 Chronic obstructive pulmonary disease, unspecified: Secondary | ICD-10-CM | POA: Diagnosis present

## 2017-10-26 DIAGNOSIS — K5903 Drug induced constipation: Secondary | ICD-10-CM | POA: Diagnosis present

## 2017-10-26 DIAGNOSIS — N183 Chronic kidney disease, stage 3 (moderate): Secondary | ICD-10-CM | POA: Diagnosis present

## 2017-10-26 DIAGNOSIS — Z79899 Other long term (current) drug therapy: Secondary | ICD-10-CM

## 2017-10-26 DIAGNOSIS — Z9049 Acquired absence of other specified parts of digestive tract: Secondary | ICD-10-CM

## 2017-10-26 DIAGNOSIS — I129 Hypertensive chronic kidney disease with stage 1 through stage 4 chronic kidney disease, or unspecified chronic kidney disease: Secondary | ICD-10-CM | POA: Diagnosis present

## 2017-10-26 DIAGNOSIS — H5462 Unqualified visual loss, left eye, normal vision right eye: Secondary | ICD-10-CM | POA: Diagnosis present

## 2017-10-26 DIAGNOSIS — Z9109 Other allergy status, other than to drugs and biological substances: Secondary | ICD-10-CM

## 2017-10-26 DIAGNOSIS — Z882 Allergy status to sulfonamides status: Secondary | ICD-10-CM

## 2017-10-26 DIAGNOSIS — Z85828 Personal history of other malignant neoplasm of skin: Secondary | ICD-10-CM

## 2017-10-26 DIAGNOSIS — H3530 Unspecified macular degeneration: Secondary | ICD-10-CM | POA: Diagnosis present

## 2017-10-26 DIAGNOSIS — Z8673 Personal history of transient ischemic attack (TIA), and cerebral infarction without residual deficits: Secondary | ICD-10-CM

## 2017-10-26 DIAGNOSIS — Z90710 Acquired absence of both cervix and uterus: Secondary | ICD-10-CM

## 2017-10-26 DIAGNOSIS — K219 Gastro-esophageal reflux disease without esophagitis: Secondary | ICD-10-CM | POA: Diagnosis present

## 2017-10-26 DIAGNOSIS — G629 Polyneuropathy, unspecified: Secondary | ICD-10-CM | POA: Diagnosis present

## 2017-10-26 DIAGNOSIS — M810 Age-related osteoporosis without current pathological fracture: Secondary | ICD-10-CM | POA: Diagnosis present

## 2017-10-26 DIAGNOSIS — Z881 Allergy status to other antibiotic agents status: Secondary | ICD-10-CM

## 2017-10-26 DIAGNOSIS — Z885 Allergy status to narcotic agent status: Secondary | ICD-10-CM

## 2017-10-26 DIAGNOSIS — E785 Hyperlipidemia, unspecified: Secondary | ICD-10-CM | POA: Diagnosis present

## 2017-10-26 DIAGNOSIS — G894 Chronic pain syndrome: Secondary | ICD-10-CM | POA: Diagnosis present

## 2017-10-26 DIAGNOSIS — Z7982 Long term (current) use of aspirin: Secondary | ICD-10-CM

## 2017-10-26 DIAGNOSIS — F419 Anxiety disorder, unspecified: Secondary | ICD-10-CM | POA: Diagnosis present

## 2017-10-26 DIAGNOSIS — Z8249 Family history of ischemic heart disease and other diseases of the circulatory system: Secondary | ICD-10-CM

## 2017-10-26 LAB — CBC WITH DIFFERENTIAL/PLATELET
Abs Immature Granulocytes: 0 10*3/uL (ref 0.0–0.1)
Basophils Absolute: 0.1 10*3/uL (ref 0.0–0.1)
Basophils Relative: 1 %
Eosinophils Absolute: 0.5 10*3/uL (ref 0.0–0.7)
Eosinophils Relative: 6 %
HCT: 39.3 % (ref 36.0–46.0)
Hemoglobin: 12.2 g/dL (ref 12.0–15.0)
Immature Granulocytes: 0 %
Lymphocytes Relative: 19 %
Lymphs Abs: 1.6 10*3/uL (ref 0.7–4.0)
MCH: 29.3 pg (ref 26.0–34.0)
MCHC: 31 g/dL (ref 30.0–36.0)
MCV: 94.5 fL (ref 78.0–100.0)
Monocytes Absolute: 1 10*3/uL (ref 0.1–1.0)
Monocytes Relative: 12 %
Neutro Abs: 5.1 10*3/uL (ref 1.7–7.7)
Neutrophils Relative %: 62 %
Platelets: 257 10*3/uL (ref 150–400)
RBC: 4.16 MIL/uL (ref 3.87–5.11)
RDW: 14.5 % (ref 11.5–15.5)
WBC: 8.2 10*3/uL (ref 4.0–10.5)

## 2017-10-26 LAB — COMPREHENSIVE METABOLIC PANEL
ALT: 16 U/L (ref 14–54)
AST: 26 U/L (ref 15–41)
Albumin: 3.3 g/dL — ABNORMAL LOW (ref 3.5–5.0)
Alkaline Phosphatase: 96 U/L (ref 38–126)
Anion gap: 8 (ref 5–15)
BUN: 11 mg/dL (ref 6–20)
CO2: 21 mmol/L — ABNORMAL LOW (ref 22–32)
Calcium: 9 mg/dL (ref 8.9–10.3)
Chloride: 104 mmol/L (ref 101–111)
Creatinine, Ser: 1.45 mg/dL — ABNORMAL HIGH (ref 0.44–1.00)
GFR calc Af Amer: 39 mL/min — ABNORMAL LOW (ref >60)
GFR calc non Af Amer: 34 mL/min — ABNORMAL LOW (ref >60)
Glucose, Bld: 90 mg/dL (ref 65–99)
Potassium: 3.9 mmol/L (ref 3.5–5.1)
Sodium: 133 mmol/L — ABNORMAL LOW (ref 135–145)
Total Bilirubin: 0.5 mg/dL (ref 0.3–1.2)
Total Protein: 7.4 g/dL (ref 6.5–8.1)

## 2017-10-26 LAB — LIPASE, BLOOD: Lipase: 22 U/L (ref 11–51)

## 2017-10-26 LAB — I-STAT CG4 LACTIC ACID, ED: Lactic Acid, Venous: 1.72 mmol/L (ref 0.5–1.9)

## 2017-10-26 MED ORDER — ACETAMINOPHEN 500 MG PO TABS: 500.0000 mg | ORAL_TABLET | Freq: Once | ORAL | Status: DC

## 2017-10-26 MED ORDER — FLEET ENEMA 7-19 GM/118ML RE ENEM
1.0000 | ENEMA | Freq: Once | RECTAL | Status: DC
  Filled 2017-10-26: qty 1

## 2017-10-26 MED ORDER — IOHEXOL 300 MG/ML  SOLN
80.0000 mL | Freq: Once | INTRAMUSCULAR | Status: AC | PRN
  Administered 2017-10-26: 100 mL via INTRAVENOUS

## 2017-10-26 MED ORDER — FENTANYL CITRATE (PF) 100 MCG/2ML IJ SOLN
50.0000 ug | Freq: Once | INTRAMUSCULAR | Status: AC
  Administered 2017-10-26: 50 ug via INTRAVENOUS
  Filled 2017-10-26: qty 2

## 2017-10-26 MED ORDER — ONDANSETRON HCL 4 MG/2ML IJ SOLN
4.0000 mg | Freq: Once | INTRAMUSCULAR | Status: AC
  Administered 2017-10-26: 4 mg via INTRAVENOUS
  Filled 2017-10-26: qty 2

## 2017-10-26 NOTE — ED Provider Notes (Signed)
[facility] EMERGENCY DEPARTMENT Provider Note   CSN: [id number] Arrival date & time: 10/26/17  2103     History   Chief Complaint Chief Complaint  Patient presents with  . Abdominal Pain    HPI [name] is a 77 y.o. female.  HPI  Patient is a 77 year old female with a past medical history of partial colectomy with ostomy status post reversal.  Patient also had recent hip surgery and is currently taking narcotic pain medications as well as antibiotics for a surgical site infection.  Patient comes in today complaining of abdominal pain which she states is crampy in nature and diffuse.  Patient also with approximately 9 episodes of emesis prior to arrival.  Patient has been seen twice for her abdominal pain in the last week with a rectal disimpact [place] on last visit.  Patient states she frequently has constipation and her symptoms are consistent with previous episodes.  Patient states that she had one small volume hard bowel movement earlier today with 2 episodes of diarrhea.  Patient denies any fevers or chills, chest pain or shortness of breath.  She denies hematemesis or blood in stool.   Past Medical History:  Diagnosis Date  . Anemia   . B12 deficiency   . Bacteremia 10/07/2014  . Bladder incontinence   . Blind left eye   . Cancer ([place])    skin cancer   . Cataract   . Cervical spine fracture ([place])   . Chest pain 07/29/2013  . Compression fracture    C7,  upper T spine -compression fx  . COPD (chronic obstructive pulmonary disease) ([place])   . DDD (degenerative disc disease), lumbar   . Depression    major  . Diffuse myofascial pain syndrome 03/24/2015  . Dyspnea    at times- when activty  . Fever 10/05/2014  . GERD (gastroesophageal reflux disease)   . Hiatal hernia   . Hyperlipidemia   . Hypertension   . 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 Macular degeneration   . Osteoarthritis   . Osteoporosis   . Pneumonia    years ago    . Reactive airway disease   . Rupture of bowel ([place])   . Sepsis ([place]) 10/08/2014  . Stroke [facility])    "mini stroke"- balance and memory issue  . Stroke ([place])   . Wrist pain, acute 09/10/2012    Patient Active Problem List   Diagnosis Date Noted  . Primary osteoarthritis of hip 09/30/2017  . History of small bowel obstruction 09/10/2017  . 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 SBO (small bowel obstruction) ([place]) 08/24/2017  . Abnormal x-ray of pelvis 08/13/2017  . Other specified dorsopathies, sacral and sacrococcygeal region 08/04/2017  . Spondylosis without myelopathy or radiculopathy, lumbosacral region 07/17/2017  . Non-traumatic compression fracture of T3 thoracic vertebra, sequela 07/02/2017  . High risk medication use 07/02/2017  . At high risk for falls 07/02/2017  . Chronic sacroiliac joint pain (Right) 07/02/2017  . CKD (chronic kidney disease) stage 3, GFR 30-59 ml/min (HCC) 04/23/2017  . Chronic knee pain s/p total knee replacement (TKR) (Right) 04/02/2017  . DDD (degenerative disc disease), lumbar 03/05/2017  . Chronic knee pain (Bilateral) (R>L) 03/05/2017  . Osteoarthritis 03/05/2017  . Chronic musculoskeletal pain 03/05/2017  . Neurogenic pain 03/05/2017  . Chronic myofascial pain 02/12/2017  . Lumbar facet syndrome (Bilateral) (L>R) 02/12/2017  . Long term prescription benzodiazepine use 02/12/2017  . Opiate use 02/12/2017  .  Disorder of skeletal system 02/12/2017  . Pharmacologic therapy 02/12/2017  . Problems influencing health status 02/12/2017  . 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 NSAID long-term use 02/12/2017  . DDD (degenerative disc disease), cervical 11/11/2016  . Lumbar facet arthropathy (Bilateral) 11/11/2016  . 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 CAFL (chronic airflow limitation) ([place]) 11/07/2016  . Depression, major, in partial remission ([place]) 11/07/2016  .  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 Bed wetting 11/07/2016  . Long term current use of opiate analgesic 11/07/2016  . Long term prescription opiate use 11/07/2016  . Chronic pain syndrome 11/07/2016  . Chronic neck pain (Secondary Area of Pain) (Bilateral) (L>R) 11/07/2016  . Chronic hip pain (Right) 11/07/2016  . Rib pain 11/07/2016  . Age-related osteoporosis with current pathological fracture with routine healing 09/20/2016  . History of stroke 09/20/2016  . Major depression in remission ([place]) 09/20/2016  . Hip fracture ([place]) 09/13/2016  . Intertrochanteric fracture of femur, sequela (Right) 09/13/2016  . Fall   . Chronic shoulder pain (Bilateral) 08/12/2016  . Hx of total knee replacement (Bilateral) 04/04/2016  . Chronic shoulder pain (Left) 02/28/2016  . Hoarseness 01/12/2016  . Pharyngoesophageal dysphagia 01/12/2016  . Chronic low back pain [facility] Area [facility] Pain) (Bilateral) (L>R) 10/26/2015  . 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 UTI (urinary tract infection) 06/25/2015  . Syncope, non cardiac 05/22/2015  . Leukopenia 10/07/2014  . Thrombocytopenia ([place]) 10/07/2014  . Hypoxia 10/05/2014  . Anxiety 06/15/2014  . Apoplectic vertigo 06/15/2014  . Duodenogastric reflux 06/15/2014  . Benign essential HTN 06/15/2014  . Infection of the upper respiratory tract 06/15/2014  . Hypercholesteremia 07/30/2013  . HTN (hypertension) 07/30/2013  . GERD (gastroesophageal reflux disease) 07/30/2013  . Dizziness and giddiness 06/18/2013  . Memory loss 05/03/2013  . Chronic knee pain (Primary Area of Pain) (Right) 09/10/2012    Past Surgical History:  Procedure Laterality Date  . ABDOMINAL HYSTERECTOMY    . ABDOMINAL SURGERY    . APPENDECTOMY    . BLADDER SURGERY    . CHOLECYSTECTOMY    . COLON SURGERY    . COLOSTOMY REVERSAL    . EYE SURGERY     cataracts  . HIP SURGERY Right 08/2016  .  INTRAMEDULLARY (IM) NAIL INTERTROCHANTERIC Right 09/13/2016   Procedure: INTRAMEDULLARY (IM) NAIL INTERTROCHANTRIC RIGHT;  Surgeon: Leandrew Koyanagi, MD;  Location: [facility];  Service: Orthopedics;  Laterality: Right;  . JOINT REPLACEMENT Bilateral   . KNEE SURGERY    . OSTOMY    . RECTOCELE REPAIR    . TOE SURGERY Right    3rd  . TOTAL HIP ARTHROPLASTY Right 09/30/2017   Procedure: RIGHT HIP IM NAIL REMOVAL WITH CONVERSION TO TOTAL HIP ARTHOPLASTY, anterior approach;  Surgeon: Renette Butters, MD;  Location: [facility];  Service: Orthopedics;  Laterality: Right;     OB History    Gravida  0   Para  0   Term  0   Preterm  0   AB  0   Living        SAB  0   TAB  0   Ectopic  0   Multiple      Live Births               Home Medications    Prior to Admission medications   Medication Sig Start Date End Date Taking?  Authorizing Provider  albuterol (ACCUNEB) 1.25 MG/3ML nebulizer solution Take 1 ampule by nebulization every 6 (six) hours as needed for wheezing.   Yes [provider]  albuterol (PROVENTIL HFA;VENTOLIN HFA) 108 (90 BASE) MCG/ACT inhaler Inhale 2 puffs into the lungs every 6 (six) hours as needed for wheezing or shortness of breath.    Yes [provider]  alendronate (FOSAMAX) 70 MG tablet Take 70 mg by mouth every Monday. Take with a full glass of water on an empty stomach.   Yes [provider]  ALPRAZolam Duanne Moron) 1 MG tablet Take 1 mg by mouth at bedtime.  02/03/17  Yes [provider]  amLODipine (NORVASC) 5 MG tablet Take 5 mg by mouth daily.  04/06/13  Yes [provider]  aspirin EC 81 MG tablet Take 1 tablet (81 mg total) by mouth 2 (two) times daily. For DVT prophylaxis 10/02/17  Yes Martensen, Charna Elizabeth III, PA-C  Calcium-Phosphorus-Vitamin D (CALCIUM/D3 ADULT GUMMIES PO) Take 1 tablet by mouth daily.   Yes [provider]  citalopram (CELEXA) 20 MG tablet Take 30 mg by mouth daily.  02/27/17  Yes  [provider]  docusate sodium (COLACE) 100 MG capsule Take 300 mg by mouth 2 (two) times daily as needed for moderate constipation (take 3 capsules scheduled EVERY morning & 3 capsules in the evening as needed for constipation).    Yes [provider]  esomeprazole (NEXIUM) 40 MG capsule Take 40 mg by mouth 2 (two) times daily before a meal.    Yes [provider]  Ferrous Sulfate (IRON) 325 (65 Fe) MG TABS Take 1 tablet (325 mg total) by mouth 2 (two) times daily. 10/01/17  Yes Prudencio Burly III, PA-C  gabapentin (NEURONTIN) 400 MG capsule Take 1 capsule (400 mg total) by mouth at bedtime. Patient taking differently: Take 400 mg by mouth daily.  09/16/17 12/15/17 Yes King, Diona Foley, NP  GINKGO BILOBA EXTRACT PO Take 1 tablet by mouth daily.   Yes [provider]  HYDROcodone-acetaminophen (NORCO/VICODIN) 5-325 MG tablet Take 1 tablet by mouth 3 (three) times daily. 10/15/17  Yes [provider]  Javier Docker Oil 350 MG CAPS Take 350 mg by mouth daily.   Yes [provider]  lactose free nutrition (BOOST) LIQD Take 237 mLs by mouth daily.   Yes [provider]  linaclotide (LINZESS) 290 MCG CAPS capsule Take 290 mcg by mouth daily.   Yes [provider]  lovastatin (MEVACOR) 10 MG tablet Take 10 mg by mouth daily. 09/22/14  Yes [provider]  LYRICA 50 MG capsule Take 50 mg by mouth 2 (two) times daily. 10/15/17  Yes [provider]  meloxicam (MOBIC) 15 MG tablet Take 15 mg by mouth daily.  05/23/16  Yes [provider]  Menthol, Topical Analgesic, (BIOFREEZE EX) Apply 1 application topically daily as needed (muscle pain).   Yes [provider]  Multiple Vitamins-Minerals (MULTIVITAMIN GUMMIES ADULT PO) Take 1 tablet by mouth daily.   Yes [provider]  naloxegol oxalate (MOVANTIK) 12.5 MG TABS tablet Take 1 tablet (12.5 mg total) by mouth daily. To prevent constipation when  taking narcotic pain medicine. 09/30/17  Yes Prudencio Burly III, PA-C  ondansetron (ZOFRAN-ODT) 8 MG disintegrating tablet Take 8 mg by mouth every 8 (eight) hours as needed for nausea or vomiting.   Yes [provider]  orphenadrine (NORFLEX) 100 MG tablet Take 1 tablet (100 mg total) by mouth 2 (two) times  daily. Patient taking differently: Take 100 mg by mouth 2 (two) times daily as needed for muscle spasms.  09/16/17  Yes King, Diona Foley, NP  polyethylene glycol (MIRALAX / GLYCOLAX) packet Take 17 g by mouth daily as needed (for constipation.).    Yes [provider]  Probiotic Product (PROBIOTIC PO) Take 1 capsule by mouth daily.   Yes [provider]  senna (SENOKOT) 8.6 MG TABS tablet Take 2 tablets by mouth daily as needed (for constipation.).    Yes [provider]  sulfamethoxazole-trimethoprim (BACTRIM DS,SEPTRA DS) 800-160 MG tablet Take 1 tablet by mouth 2 (two) times daily. 10/23/17  Yes [provider]  traZODone (DESYREL) 100 MG tablet Take 100 mg by mouth at bedtime.    Yes [provider]  docusate sodium (COLACE) 100 MG capsule Take 1 capsule (100 mg total) by mouth 2 (two) times daily. To prevent constipation while taking pain medication. Patient not taking: Reported on 10/26/2017 09/30/17   Prudencio Burly III, PA-C  oxyCODONE (ROXICODONE) 5 MG immediate release tablet Take 1 tablet (5 mg total) by mouth every 4 (four) hours as needed for breakthrough pain. Patient not taking: Reported on 10/26/2017 09/30/17 10/30/17  Prudencio Burly III, PA-C    Family History Family History  Problem Relation Age of Onset  . Heart failure Mother   . Heart attack Father     Social History Social History   Tobacco Use  . Smoking status: Former Smoker    Packs/day: 1.50    Years: 30.00    Pack years: 45.00    Last attempt to quit: 2013    Years since quitting: 6.4  . Smokeless tobacco: Never Used  Substance Use Topics   . Alcohol use: No    Alcohol/week: 0.0 oz  . Drug use: No     Allergies   Ampicillin; Ampicillin-sulbactam sodium; Ambien [zolpidem tartrate]; Flexeril [cyclobenzaprine]; Tape; Toradol [ketorolac tromethamine]; Sulfamethoxazole-trimethoprim; Cephalexin; Percocet [oxycodone-acetaminophen]; and Tramadol   Review of Systems Review of Systems  Constitutional: Negative for chills and fever.  Respiratory: Negative for shortness of breath.   Cardiovascular: Negative for chest pain.  Gastrointestinal: Positive for abdominal pain, constipation, diarrhea, nausea and vomiting. Negative for blood in stool.  All other systems reviewed and are negative.    Physical Exam Updated Vital Signs BP (!) 124/58   Pulse 91   Temp 98.5 F (36.9 C) (Oral)   Resp (!) 23   Ht 5\' 4"  (1.626 m)   Wt 80.7 kg (178 lb)   SpO2 95%   BMI 30.55 kg/m   Physical Exam  Constitutional: She appears well-developed and well-nourished. No distress.  HENT:  Head: Normocephalic and atraumatic.  Eyes: Conjunctivae are normal.  Neck: Neck supple.  Cardiovascular: Normal rate and regular rhythm.  No murmur heard. Pulmonary/Chest: Effort normal and breath sounds normal. No respiratory distress.  Abdominal: Soft. Bowel sounds are decreased. There is generalized tenderness. There is guarding.  Musculoskeletal: She exhibits no edema.  Neurological: She is alert.  Skin: Skin is warm and dry.  Psychiatric: She has a normal mood and affect.  Nursing note and vitals reviewed.    ED Treatments / Results  Labs (all labs ordered are listed, but only abnormal results are displayed) Labs Reviewed  COMPREHENSIVE METABOLIC PANEL - Abnormal; Notable for the following components:      Result Value   Sodium 133 (*)    CO2 21 (*)    Creatinine, Ser 1.45 (*)  Albumin 3.3 (*)    GFR calc non Af Amer 34 (*)    GFR calc Af Amer 39 (*)    All other components within normal limits  CBC WITH DIFFERENTIAL/PLATELET  LIPASE,  BLOOD  I-STAT CG4 LACTIC ACID, ED    EKG None  Radiology Ct Abdomen Pelvis W Contrast  Result Date: 10/27/2017 CLINICAL DATA:  Abdominal pain and distension.  Vomiting. EXAM: CT ABDOMEN AND PELVIS WITH CONTRAST TECHNIQUE: Multidetector CT imaging of the abdomen and pelvis was performed using the standard protocol following bolus administration of intravenous contrast. CONTRAST:  174mL OMNIPAQUE IOHEXOL 300 MG/ML  SOLN COMPARISON:  Abdominal CT 08/24/2017 FINDINGS: Lower chest: Breathing motion artifact. Mild lung base scarring. No pleural fluid or consolidation. Hepatobiliary: Decreased hepatic density consistent with steatosis. Postcholecystectomy with stable intra and extrahepatic biliary ductal dilatation. Pancreas: Parenchymal atrophy. No ductal dilatation or inflammation. Spleen: Calcified granuloma.  Normal in size. Adrenals/Urinary Tract: Subcentimeter left adrenal nodularity. Right adrenal gland is normal. No hydronephrosis or perinephric edema. Homogeneous renal enhancement with symmetric excretion on delayed phase imaging. Bilateral low-density renal lesions, majority of which are too small to accurately characterize due to size. Urinary bladder is physiologically distended without wall thickening. Stomach/Bowel: Small hiatal hernia. No abnormal gastric distension. Majority of small bowel is fluid-filled and prominent caliber without transition point. Fluid-filled ascending, distal transverse, descending and rectosigmoid colon which is also prominent caliber. No bowel wall thickening or inflammatory change. Diverticulosis of the distal colon without diverticulitis. Appendix not visualized, surgically absent per history. Vascular/Lymphatic: Calcified bilateral renal artery aneurysms, 7 mm on the left and 6 mm on the right, unchanged allowing for differences in caliper placement. Aortic atherosclerosis without aortic aneurysm. Small retroperitoneal lymph nodes not enlarged by size criteria.  Reproductive: Status post hysterectomy. No adnexal masses. Other: No free air or ascites.  No intra-abdominal abscess. Musculoskeletal: Right hip arthroplasty with expected postsurgical changes in the periarticular soft tissues. No evidence of periarticular abscess or fluid collection. Remote right sacral and pubic bone fracture. IMPRESSION: 1. Fluid-filled large and small bowel, suggestive of generalized ileus or enteritis. No transition point to suggest obstruction or significant bowel inflammation. 2. No other acute abnormality in the abdomen or pelvis. 3. 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 4.  Aortic Atherosclerosis (ICD10-I70.0). Electronically Signed   By: Jeb Levering M.D.   On: 10/27/2017 00:13    Procedures Procedures (including critical care time)  Medications Ordered in ED Medications  ondansetron (ZOFRAN) injection 4 mg (4 mg Intravenous Given 10/26/17 2143)  fentaNYL (SUBLIMAZE) injection 50 mcg (50 mcg Intravenous Given 10/26/17 2255)  iohexol (OMNIPAQUE) 300 MG/ML solution 80 mL (100 mLs Intravenous Contrast Given 10/26/17 2336)     Initial Impression / Assessment and Plan / ED Course  I have reviewed the triage vital signs and the nursing notes.  Pertinent labs & imaging results that were available during my care of the patient were reviewed by me and considered in my medical decision making (see chart for details).     Laboratory work-up largely within normal limits without concerning findings.  Given patient's frequent visits for the same complaint will collect CT abdomen and pelvis for further evaluation.    CT abdomen pelvis reveals ileus.  Will admit the patient for further evaluation and care.  Final Clinical Impressions(s) / ED Diagnoses   Final diagnoses:  Ile[facility])    ED Discharge Orders    None       Chapman Moss, MD 10/27/17  2446    Elnora Morrison, MD 10/27/17 [phone]

## 2017-10-26 NOTE — ED Triage Notes (Signed)
Patient BIB [facility] for severe abdominal pain since 1300 today. Patient also c/o vomiting x 9 today. Patient's family said she was recently digitally disimpacted at [facility] last week and is currently taking antibiotics for surgical site infection of RIGHT hip. Patient denies SOB, chest pain, and dysuria. EMS gave 4mg  Zofran.

## 2017-10-27 ENCOUNTER — Inpatient Hospital Stay (HOSPITAL_COMMUNITY): Payer: Medicare Other

## 2017-10-27 ENCOUNTER — Encounter (HOSPITAL_COMMUNITY): Payer: Self-pay | Admitting: Emergency Medicine

## 2017-10-27 DIAGNOSIS — K567 Ileus, unspecified: Secondary | ICD-10-CM | POA: Diagnosis present

## 2017-10-27 DIAGNOSIS — L0390 Cellulitis, unspecified: Secondary | ICD-10-CM | POA: Diagnosis present

## 2017-10-27 DIAGNOSIS — D631 Anemia in chronic kidney disease: Secondary | ICD-10-CM | POA: Diagnosis present

## 2017-10-27 DIAGNOSIS — E785 Hyperlipidemia, unspecified: Secondary | ICD-10-CM | POA: Diagnosis present

## 2017-10-27 DIAGNOSIS — H5462 Unqualified visual loss, left eye, normal vision right eye: Secondary | ICD-10-CM | POA: Diagnosis present

## 2017-10-27 DIAGNOSIS — F329 Major depressive disorder, single episode, unspecified: Secondary | ICD-10-CM | POA: Diagnosis present

## 2017-10-27 DIAGNOSIS — Z85828 Personal history of other malignant neoplasm of skin: Secondary | ICD-10-CM | POA: Diagnosis not present

## 2017-10-27 DIAGNOSIS — Z90710 Acquired absence of both cervix and uterus: Secondary | ICD-10-CM | POA: Diagnosis not present

## 2017-10-27 DIAGNOSIS — I129 Hypertensive chronic kidney disease with stage 1 through stage 4 chronic kidney disease, or unspecified chronic kidney disease: Secondary | ICD-10-CM | POA: Diagnosis present

## 2017-10-27 DIAGNOSIS — N183 Chronic kidney disease, stage 3 (moderate): Secondary | ICD-10-CM | POA: Diagnosis present

## 2017-10-27 DIAGNOSIS — Z8673 Personal history of transient ischemic attack (TIA), and cerebral infarction without residual deficits: Secondary | ICD-10-CM | POA: Diagnosis not present

## 2017-10-27 DIAGNOSIS — Z87891 Personal history of nicotine dependence: Secondary | ICD-10-CM | POA: Diagnosis not present

## 2017-10-27 DIAGNOSIS — Z96641 Presence of right artificial hip joint: Secondary | ICD-10-CM | POA: Diagnosis present

## 2017-10-27 DIAGNOSIS — H3530 Unspecified macular degeneration: Secondary | ICD-10-CM | POA: Diagnosis present

## 2017-10-27 DIAGNOSIS — M810 Age-related osteoporosis without current pathological fracture: Secondary | ICD-10-CM | POA: Diagnosis present

## 2017-10-27 DIAGNOSIS — R109 Unspecified abdominal pain: Secondary | ICD-10-CM | POA: Diagnosis present

## 2017-10-27 DIAGNOSIS — G629 Polyneuropathy, unspecified: Secondary | ICD-10-CM | POA: Diagnosis present

## 2017-10-27 DIAGNOSIS — K5903 Drug induced constipation: Secondary | ICD-10-CM | POA: Diagnosis present

## 2017-10-27 DIAGNOSIS — Z9049 Acquired absence of other specified parts of digestive tract: Secondary | ICD-10-CM | POA: Diagnosis not present

## 2017-10-27 DIAGNOSIS — J449 Chronic obstructive pulmonary disease, unspecified: Secondary | ICD-10-CM | POA: Diagnosis present

## 2017-10-27 DIAGNOSIS — M1990 Unspecified osteoarthritis, unspecified site: Secondary | ICD-10-CM | POA: Diagnosis present

## 2017-10-27 DIAGNOSIS — F419 Anxiety disorder, unspecified: Secondary | ICD-10-CM | POA: Diagnosis present

## 2017-10-27 DIAGNOSIS — T40605A Adverse effect of unspecified narcotics, initial encounter: Secondary | ICD-10-CM | POA: Diagnosis present

## 2017-10-27 DIAGNOSIS — G894 Chronic pain syndrome: Secondary | ICD-10-CM | POA: Diagnosis present

## 2017-10-27 DIAGNOSIS — K219 Gastro-esophageal reflux disease without esophagitis: Secondary | ICD-10-CM | POA: Diagnosis present

## 2017-10-27 LAB — BASIC METABOLIC PANEL
Anion gap: 7 (ref 5–15)
BUN: 10 mg/dL (ref 6–20)
CO2: 25 mmol/L (ref 22–32)
Calcium: 8.8 mg/dL — ABNORMAL LOW (ref 8.9–10.3)
Chloride: 108 mmol/L (ref 101–111)
Creatinine, Ser: 1.29 mg/dL — ABNORMAL HIGH (ref 0.44–1.00)
GFR calc Af Amer: 45 mL/min — ABNORMAL LOW (ref >60)
GFR calc non Af Amer: 39 mL/min — ABNORMAL LOW (ref >60)
Glucose, Bld: 79 mg/dL (ref 65–99)
Potassium: 4.5 mmol/L (ref 3.5–5.1)
Sodium: 140 mmol/L (ref 135–145)

## 2017-10-27 LAB — MRSA PCR SCREENING: MRSA by PCR: NEGATIVE

## 2017-10-27 IMAGING — DX DG CHEST 2V
2 series · 2 of 2 positions shown · non-contrast
Comparison: 05/22/2015

CLINICAL DATA: Intermittent midsternal chest pain for 2-3 weeks,
worsened today after a period of lightheadedness.

EXAM:
CHEST  2 VIEW

[chest pa]
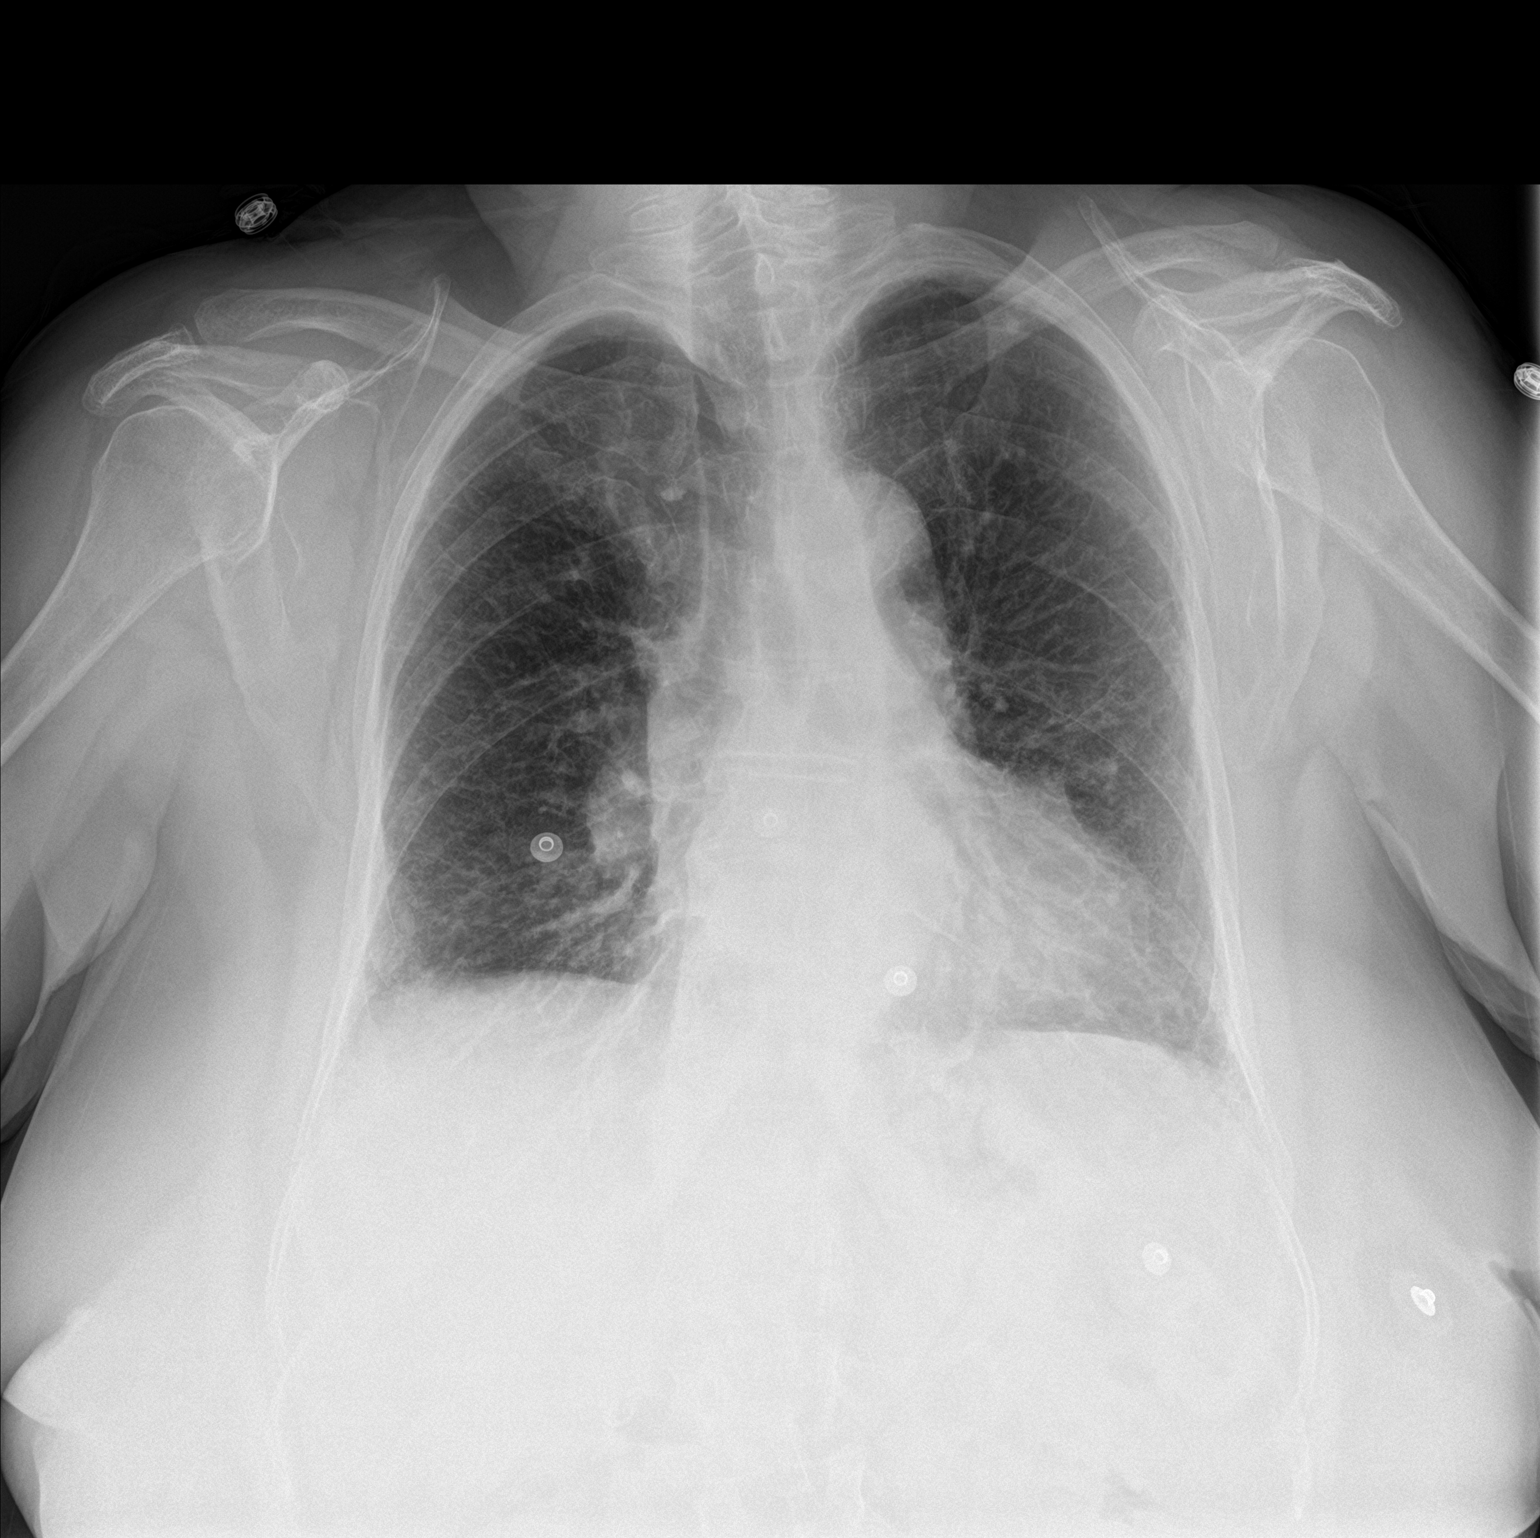

[chest lat]
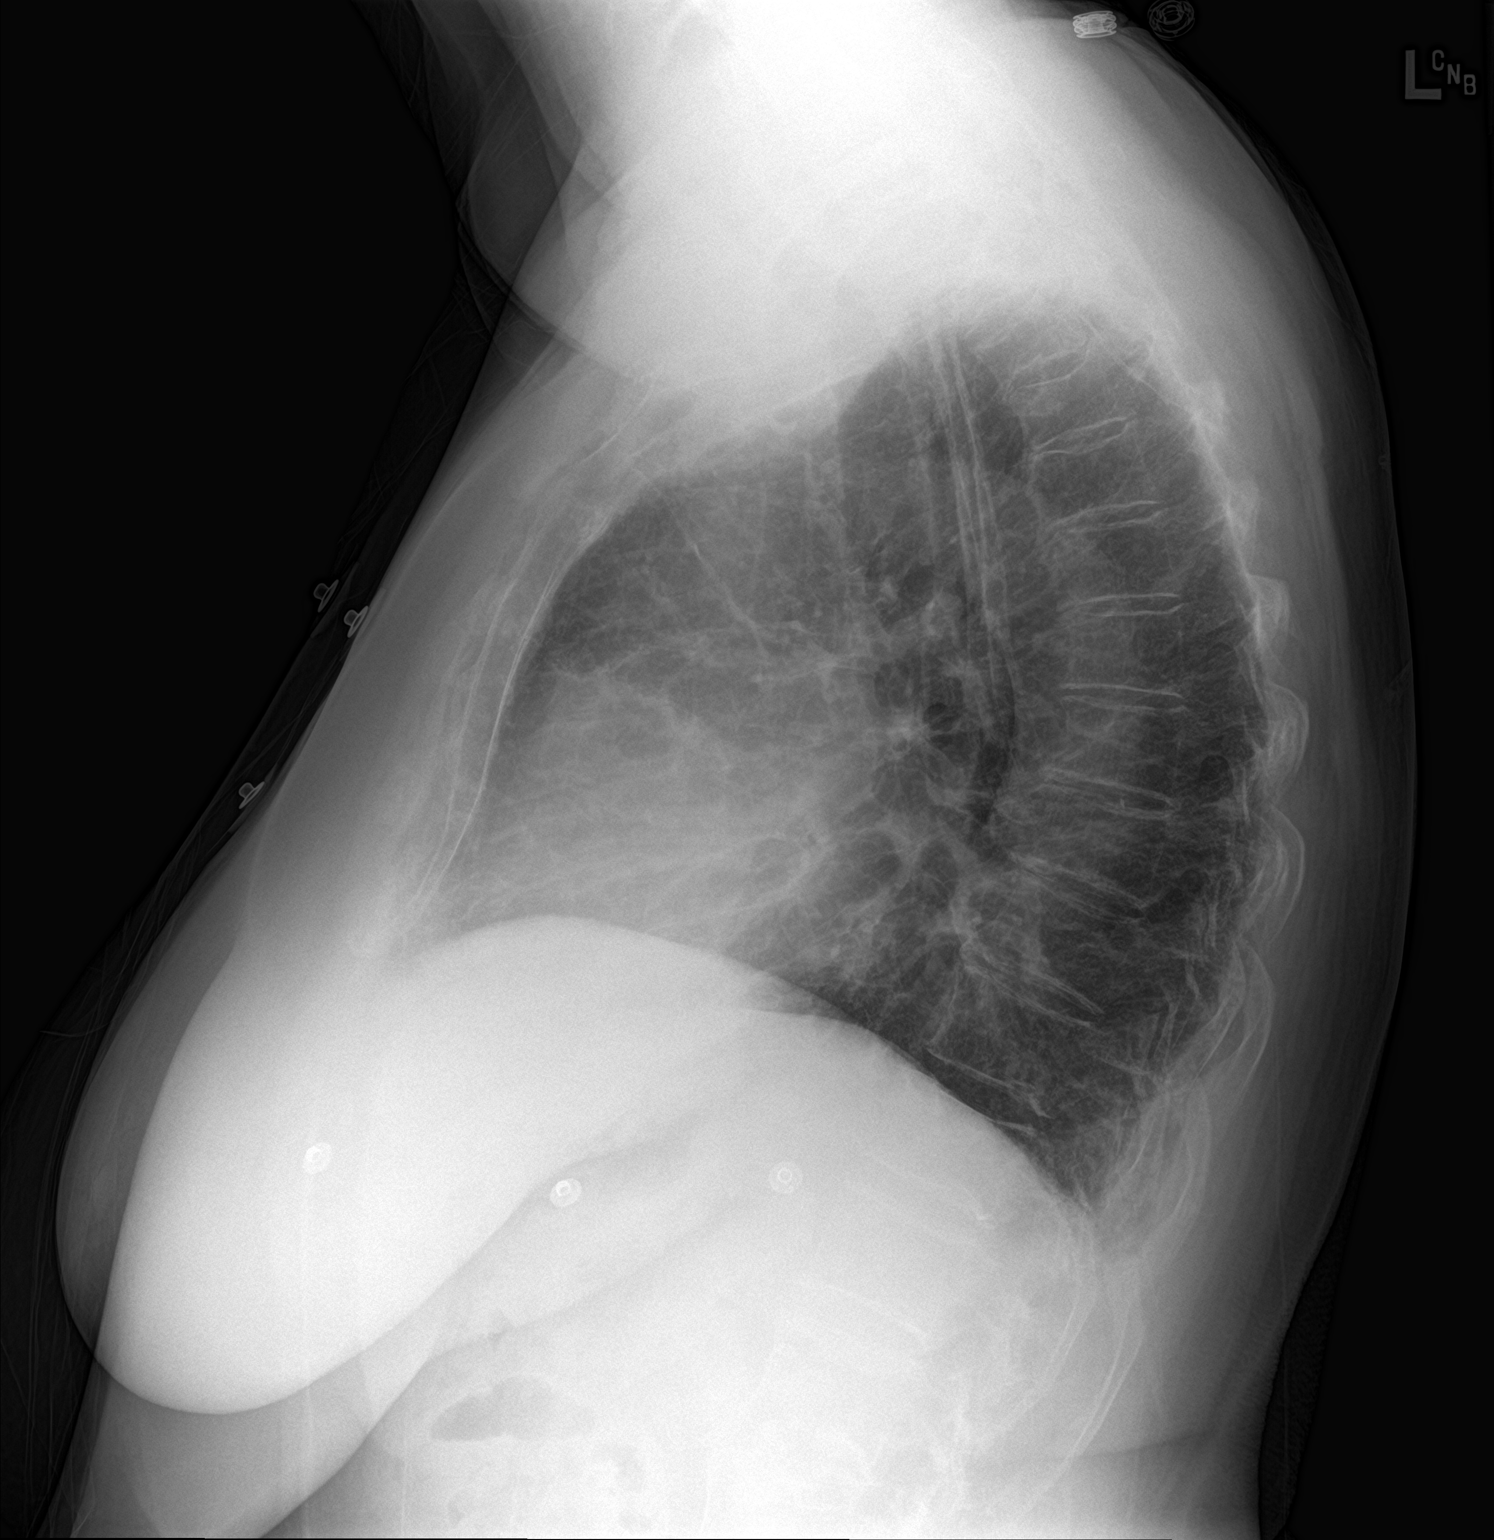

[2 of 2 positions shown; findings below may reference images not displayed]

FINDINGS: There is mild interstitial coarsening in both bases, chronic. No
airspace opacities. No effusions. Normal pulmonary vasculature.
Borderline heart size, unchanged. Unremarkable hilar and mediastinal
contours, unchanged.
IMPRESSION: No active cardiopulmonary disease.

## 2017-10-27 MED ORDER — PRAVASTATIN SODIUM 10 MG PO TABS
10.0000 mg | ORAL_TABLET | Freq: Every day | ORAL | Status: DC
  Administered 2017-10-27 – 2017-10-29 (×3): 10 mg via ORAL
  Filled 2017-10-27 (×3): qty 1

## 2017-10-27 MED ORDER — MELOXICAM 7.5 MG PO TABS
15.0000 mg | ORAL_TABLET | Freq: Every day | ORAL | Status: DC
  Administered 2017-10-27 – 2017-10-30 (×4): 15 mg via ORAL
  Filled 2017-10-27 (×4): qty 2

## 2017-10-27 MED ORDER — METHYLNALTREXONE BROMIDE 12 MG/0.6ML ~~LOC~~ SOLN: 12.0000 mg | SUBCUTANEOUS | Status: DC

## 2017-10-27 MED ORDER — ACETAMINOPHEN 650 MG RE SUPP: 650.0000 mg | Freq: Four times a day (QID) | RECTAL | Status: DC | PRN

## 2017-10-27 MED ORDER — PANTOPRAZOLE SODIUM 40 MG PO TBEC
80.0000 mg | DELAYED_RELEASE_TABLET | Freq: Every day | ORAL | Status: DC
  Administered 2017-10-27 – 2017-10-30 (×4): 80 mg via ORAL
  Filled 2017-10-27 (×4): qty 2

## 2017-10-27 MED ORDER — ASPIRIN EC 81 MG PO TBEC
81.0000 mg | DELAYED_RELEASE_TABLET | Freq: Two times a day (BID) | ORAL | Status: DC
  Administered 2017-10-27 – 2017-10-30 (×7): 81 mg via ORAL
  Filled 2017-10-27 (×7): qty 1

## 2017-10-27 MED ORDER — ONDANSETRON HCL 4 MG PO TABS: 4.0000 mg | ORAL_TABLET | Freq: Four times a day (QID) | ORAL | Status: DC | PRN

## 2017-10-27 MED ORDER — ENOXAPARIN SODIUM 40 MG/0.4ML ~~LOC~~ SOLN
40.0000 mg | SUBCUTANEOUS | Status: DC
  Administered 2017-10-27 – 2017-10-30 (×4): 40 mg via SUBCUTANEOUS
  Filled 2017-10-27 (×3): qty 0.4

## 2017-10-27 MED ORDER — ACETAMINOPHEN 325 MG PO TABS: 650.0000 mg | ORAL_TABLET | Freq: Four times a day (QID) | ORAL | Status: DC | PRN

## 2017-10-27 MED ORDER — PREGABALIN 50 MG PO CAPS
50.0000 mg | ORAL_CAPSULE | Freq: Two times a day (BID) | ORAL | Status: DC
  Administered 2017-10-27 – 2017-10-30 (×7): 50 mg via ORAL
  Filled 2017-10-27 (×7): qty 1

## 2017-10-27 MED ORDER — TRAZODONE HCL 100 MG PO TABS
100.0000 mg | ORAL_TABLET | Freq: Every day | ORAL | Status: DC
  Administered 2017-10-27 – 2017-10-29 (×4): 100 mg via ORAL
  Filled 2017-10-27 (×4): qty 1

## 2017-10-27 MED ORDER — ONDANSETRON HCL 4 MG/2ML IJ SOLN
4.0000 mg | Freq: Four times a day (QID) | INTRAMUSCULAR | Status: DC | PRN
Start: 2017-10-27 — End: 2017-10-30
  Administered 2017-10-27: 4 mg via INTRAVENOUS
  Filled 2017-10-27: qty 2

## 2017-10-27 MED ORDER — CITALOPRAM HYDROBROMIDE 20 MG PO TABS
30.0000 mg | ORAL_TABLET | Freq: Every day | ORAL | Status: DC
  Administered 2017-10-27 – 2017-10-30 (×4): 30 mg via ORAL
  Filled 2017-10-27 (×4): qty 2

## 2017-10-27 MED ORDER — GABAPENTIN 400 MG PO CAPS
400.0000 mg | ORAL_CAPSULE | Freq: Every day | ORAL | Status: DC
  Administered 2017-10-27 – 2017-10-30 (×4): 400 mg via ORAL
  Filled 2017-10-27 (×4): qty 1

## 2017-10-27 MED ORDER — LINACLOTIDE 145 MCG PO CAPS: 290.0000 ug | ORAL_CAPSULE | Freq: Every day | ORAL | Status: DC

## 2017-10-27 MED ORDER — SULFAMETHOXAZOLE-TRIMETHOPRIM 800-160 MG PO TABS
1.0000 | ORAL_TABLET | Freq: Two times a day (BID) | ORAL | Status: AC
  Administered 2017-10-27 – 2017-10-29 (×6): 1 via ORAL
  Filled 2017-10-27 (×6): qty 1

## 2017-10-27 MED ORDER — ORPHENADRINE CITRATE ER 100 MG PO TB12: 100.0000 mg | ORAL_TABLET | Freq: Two times a day (BID) | ORAL | Status: DC | PRN

## 2017-10-27 MED ORDER — FENTANYL CITRATE (PF) 100 MCG/2ML IJ SOLN: 50.0000 ug | INTRAMUSCULAR | Status: DC | PRN

## 2017-10-27 MED ORDER — ALBUTEROL SULFATE (2.5 MG/3ML) 0.083% IN NEBU: 3.0000 mL | INHALATION_SOLUTION | Freq: Four times a day (QID) | RESPIRATORY_TRACT | Status: DC | PRN

## 2017-10-27 MED ORDER — SODIUM CHLORIDE 0.9 % IV SOLN
INTRAVENOUS | Status: AC
  Administered 2017-10-27 (×3): via INTRAVENOUS

## 2017-10-27 MED ORDER — METOCLOPRAMIDE HCL 5 MG PO TABS
5.0000 mg | ORAL_TABLET | Freq: Three times a day (TID) | ORAL | Status: DC
Start: 2017-10-27 — End: 2017-10-30
  Administered 2017-10-27 – 2017-10-30 (×12): 5 mg via ORAL
  Filled 2017-10-27 (×12): qty 1

## 2017-10-27 MED ORDER — AMLODIPINE BESYLATE 5 MG PO TABS
5.0000 mg | ORAL_TABLET | Freq: Every day | ORAL | Status: DC
Start: 2017-10-27 — End: 2017-10-30
  Administered 2017-10-27 – 2017-10-30 (×4): 5 mg via ORAL
  Filled 2017-10-27 (×4): qty 1

## 2017-10-27 MED ORDER — METHYLNALTREXONE BROMIDE 12 MG/0.6ML ~~LOC~~ SOLN
12.0000 mg | Freq: Once | SUBCUTANEOUS | Status: DC
  Filled 2017-10-27: qty 0.6

## 2017-10-27 MED ORDER — MORPHINE SULFATE (PF) 4 MG/ML IV SOLN
2.0000 mg | INTRAVENOUS | Status: DC | PRN
  Administered 2017-10-27 (×3): 2 mg via INTRAVENOUS
  Administered 2017-10-28 (×2): 4 mg via INTRAVENOUS
  Administered 2017-10-29: 2 mg via INTRAVENOUS
  Administered 2017-10-29: 4 mg via INTRAVENOUS
  Filled 2017-10-27 (×7): qty 1

## 2017-10-27 MED ORDER — ALPRAZOLAM 0.5 MG PO TABS
1.0000 mg | ORAL_TABLET | Freq: Every day | ORAL | Status: DC
Start: 2017-10-27 — End: 2017-10-30
  Administered 2017-10-27 – 2017-10-29 (×4): 1 mg via ORAL
  Filled 2017-10-27 (×4): qty 2

## 2017-10-27 NOTE — Consult Note (Signed)
[name] 1941/03/13  [id number].    Requesting MD: Dr. Gerlean Ren Chief Complaint/Reason for Consult: ileus  HPI:  This is a 77 yo female who states she has no medical problems but clearly has several medical issues including multiple prior colonic operations requiring colonic resection/ostomy and subsequent takedown.  She also has HTN, COPD, anemia, h/o CVA, etc.  She was admitted in late march early April for what was called an SBO with diffuse generalized abdominal discomfort.  She resolved and was actually stable for DC home the following day.    She came to the ED overnight secondary to generalized abdominal discomfort with some bilious emesis and some watery diarrhea yesterday, but none since then.  She states that this pain has come on and off for the last couple of months.  She states eating makes it better for a little while, but then she gets nausea and some emesis occasionally a couple hours after eating.  She denies any fevers.  She admits to chronic constipation that requires multiple daily stool softeners and laxatives.  She also recently had hip surgery due to a hip fx that now she takes some narcotic pain medications for.  This hip wound did become infected and she is on Bactrim for this as well.  She does not think the abx is contributing to her nausea.    Upon arrival to the ED, she had a CT scan that revealed some minimally dilated SB c/w fluid filled bowel as well as some fluid in her colon, but really not dilated much.  She was admitted and we have been asked to see her for an ileus.  ROS: ROS : Please see HPI, otherwise all other systems are negative.  Family History  Problem Relation Age of Onset  . Heart failure Mother   . Heart attack Father     Past Medical History:  Diagnosis Date  . Anemia   . B12 deficiency   . Bacteremia 10/07/2014  . Bladder incontinence   . Blind left eye   . Cancer ([place])    skin cancer   . Cataract   . Cervical spine  fracture ([place])   . Chest pain 07/29/2013  . Compression fracture    C7,  upper T spine -compression fx  . COPD (chronic obstructive pulmonary disease) ([place])   . DDD (degenerative disc disease), lumbar   . Depression    major  . Diffuse myofascial pain syndrome 03/24/2015  . Dyspnea    at times- when activty  . Fever 10/05/2014  . GERD (gastroesophageal reflux disease)   . Hiatal hernia   . Hyperlipidemia   . Hypertension   . 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 Macular degeneration   . Osteoarthritis   . Osteoporosis   . Pneumonia    years ago  . Reactive airway disease   . Rupture of bowel ([place])   . Sepsis ([place]) 10/08/2014  . Stroke [facility])    "mini stroke"- balance and memory issue  . Stroke ([place])   . Wrist pain, acute 09/10/2012    Past Surgical History:  Procedure Laterality Date  . ABDOMINAL HYSTERECTOMY    . ABDOMINAL SURGERY    . APPENDECTOMY    . BLADDER SURGERY    . CHOLECYSTECTOMY    . COLON SURGERY    . COLOSTOMY REVERSAL    . EYE SURGERY     cataracts  . HIP SURGERY Right 08/2016  . INTRAMEDULLARY (IM) NAIL  INTERTROCHANTERIC Right 09/13/2016   Procedure: INTRAMEDULLARY (IM) NAIL INTERTROCHANTRIC RIGHT;  Surgeon: Leandrew Koyanagi, MD;  Location: [facility];  Service: Orthopedics;  Laterality: Right;  . JOINT REPLACEMENT Bilateral   . KNEE SURGERY    . OSTOMY    . RECTOCELE REPAIR    . TOE SURGERY Right    3rd  . TOTAL HIP ARTHROPLASTY Right 09/30/2017   Procedure: RIGHT HIP IM NAIL REMOVAL WITH CONVERSION TO TOTAL HIP ARTHOPLASTY, anterior approach;  Surgeon: Renette Butters, MD;  Location: [facility];  Service: Orthopedics;  Laterality: Right;    Social History:  reports that she quit smoking about 6 years ago. Her smoking use included e-cigarettes and cigarettes. She has a 45.00 pack-year smoking history. She has never used smokeless tobacco. She reports that she does not drink alcohol or use drugs.  Allergies:  Allergies  Allergen Reactions  .  Ampicillin Anaphylaxis, Nausea Only and Swelling    SEVERE HEADACHE MUSCLE CRAMPS ANGIOEDEMA THRUSH PATIENT HAS HAD A PCN REACTION WITH IMMEDIATE RASH, FACIAL/TONGUE/THROAT SWELLING, SOB, OR LIGHTHEADEDNESS WITH HYPOTENSION:  #  #  YES  #  #  Has patient had a PCN reaction causing severe rash involving mucus membranes or skin necrosis: No Has patient had a PCN reaction that required hospitalization:No Has patient had a PCN reaction occurring within the last 10 years: No.  . Ampicillin-Sulbactam Sodium Anaphylaxis and Other (See Comments)    SEVERE HEADACHE MUSCLE CRAMPS ANGIOEDEMA THRUSH PATIENT HAS HAD A PCN REACTION WITH IMMEDIATE RASH, FACIAL/TONGUE/THROAT SWELLING, SOB, OR LIGHTHEADEDNESS WITH HYPOTENSION: # # YES # # Has patient had a PCN reaction causing severe rash involving mucus membranes or skin necrosis: No Has patient had a PCN reaction that required hospitalization:No Has patient had a PCN reaction occurring within the last 10 years: No   . Ambien [Zolpidem Tartrate] Nausea And Vomiting and Other (See Comments)    HALLUCINATIONS SWEATING  . Flexeril [Cyclobenzaprine] Other (See Comments)    EXTRAPYRAMIDAL MOVEMENT INVOLUNTARY MUSCLE JERKING  . Tape Itching, Rash and Other (See Comments)    Paper tape only. Adhesive tape=itching/burning/rash  . Toradol [Ketorolac Tromethamine] Nausea Only and Other (See Comments)    HEADACHE  BACKACHE  . Sulfamethoxazole-Trimethoprim     UNSPECIFIED REACTION   . Cephalexin Rash and Other (See Comments)    HEADACHES  . Percocet [Oxycodone-Acetaminophen] Itching and Rash    Percocet is OK, "itches but takes Benadryl"  . Tramadol Rash and Other (See Comments)    HEADACHE     Medications Prior to Admission  Medication Sig Dispense Refill  . albuterol (ACCUNEB) 1.25 MG/3ML nebulizer solution Take 1 ampule by nebulization every 6 (six) hours as needed for wheezing.    Marland Kitchen albuterol (PROVENTIL HFA;VENTOLIN HFA) 108 (90 BASE) MCG/ACT  inhaler Inhale 2 puffs into the lungs every 6 (six) hours as needed for wheezing or shortness of breath.     Marland Kitchen alendronate (FOSAMAX) 70 MG tablet Take 70 mg by mouth every Monday. Take with a full glass of water on an empty stomach.    . ALPRAZolam (XANAX) 1 MG tablet Take 1 mg by mouth at bedtime.     Marland Kitchen amLODipine (NORVASC) 5 MG tablet Take 5 mg by mouth daily.     Marland Kitchen aspirin EC 81 MG tablet Take 1 tablet (81 mg total) by mouth 2 (two) times daily. For DVT prophylaxis 60 tablet 0  . Calcium-Phosphorus-Vitamin D (CALCIUM/D3 ADULT GUMMIES PO) Take 1 tablet by mouth daily.    Marland Kitchen  citalopram (CELEXA) 20 MG tablet Take 30 mg by mouth daily.     Marland Kitchen docusate sodium (COLACE) 100 MG capsule Take 300 mg by mouth 2 (two) times daily as needed for moderate constipation (take 3 capsules scheduled EVERY morning & 3 capsules in the evening as needed for constipation).     Marland Kitchen esomeprazole (NEXIUM) 40 MG capsule Take 40 mg by mouth 2 (two) times daily before a meal.     . Ferrous Sulfate (IRON) 325 (65 Fe) MG TABS Take 1 tablet (325 mg total) by mouth 2 (two) times daily. 60 each 0  . gabapentin (NEURONTIN) 400 MG capsule Take 1 capsule (400 mg total) by mouth at bedtime. (Patient taking differently: Take 400 mg by mouth daily. ) 90 capsule 1  . GINKGO BILOBA EXTRACT PO Take 1 tablet by mouth daily.    Marland Kitchen HYDROcodone-acetaminophen (NORCO/VICODIN) 5-325 MG tablet Take 1 tablet by mouth 3 (three) times daily.    Javier Docker Oil 350 MG CAPS Take 350 mg by mouth daily.    Marland Kitchen lactose free nutrition (BOOST) LIQD Take 237 mLs by mouth daily.    Marland Kitchen linaclotide (LINZESS) 290 MCG CAPS capsule Take 290 mcg by mouth daily.    Marland Kitchen lovastatin (MEVACOR) 10 MG tablet Take 10 mg by mouth daily.    Marland Kitchen LYRICA 50 MG capsule Take 50 mg by mouth 2 (two) times daily.    . meloxicam (MOBIC) 15 MG tablet Take 15 mg by mouth daily.     . Menthol, Topical Analgesic, (BIOFREEZE EX) Apply 1 application topically daily as needed (muscle pain).    .  Multiple Vitamins-Minerals (MULTIVITAMIN GUMMIES ADULT PO) Take 1 tablet by mouth daily.    . naloxegol oxalate (MOVANTIK) 12.5 MG TABS tablet Take 1 tablet (12.5 mg total) by mouth daily. To prevent constipation when taking narcotic pain medicine. 30 tablet 0  . ondansetron (ZOFRAN-ODT) 8 MG disintegrating tablet Take 8 mg by mouth every 8 (eight) hours as needed for nausea or vomiting.    . orphenadrine (NORFLEX) 100 MG tablet Take 1 tablet (100 mg total) by mouth 2 (two) times daily. (Patient taking differently: Take 100 mg by mouth 2 (two) times daily as needed for muscle spasms. ) 60 tablet 1  . polyethylene glycol (MIRALAX / GLYCOLAX) packet Take 17 g by mouth daily as needed (for constipation.).     Marland Kitchen Probiotic Product (PROBIOTIC PO) Take 1 capsule by mouth daily.    Marland Kitchen senna (SENOKOT) 8.6 MG TABS tablet Take 2 tablets by mouth daily as needed (for constipation.).     Marland Kitchen sulfamethoxazole-trimethoprim (BACTRIM DS,SEPTRA DS) 800-160 MG tablet Take 1 tablet by mouth 2 (two) times daily.    . traZODone (DESYREL) 100 MG tablet Take 100 mg by mouth at bedtime.        Physical Exam: Blood pressure (!) 91/59, pulse 76, temperature 98.8 F (37.1 C), temperature source Oral, resp. rate 16, height '5\' 4"'  (1.626 m), weight 80.7 kg (178 lb), SpO2 91 %. General: obese white female who is laying in bed in NAD HEENT: head is normocephalic, atraumatic.  Sclera are noninjected.  PERRL.  Ears and nose without any masses or lesions.  Mouth is pink and moist Heart: regular, rate, and rhythm.  Normal s1,s2. No obvious murmurs, gallops, or rubs noted.  Palpable radial and pedal pulses bilaterally Lungs: CTAB, no wheezes, rhonchi, or rales noted.  Respiratory effort nonlabored Abd: soft, some generalized abdominal discomfort, but no guarding or peritonitis , ND, +  BS, no masses, hernias, or organomegaly MS: all 4 extremities are symmetrical with no cyanosis, clubbing, or edema. Skin: warm and dry with no masses,  lesions, or rashes.  Her right hip incision has some scabbing on it and some mild surrounding erythema.  Very tender to touch. Psych: A&Ox3 with an appropriate affect.   Results for orders placed or performed during the hospital encounter of 10/26/17 (from the past 48 hour(s))  CBC with Differential     Status: None   Collection Time: 10/26/17  9:23 PM  Result Value Ref Range   WBC 8.2 4.0 - 10.5 K/uL   RBC 4.16 3.87 - 5.11 MIL/uL   Hemoglobin 12.2 12.0 - 15.0 g/dL   HCT 39.3 36.0 - 46.0 %   MCV 94.5 78.0 - 100.0 fL   MCH 29.3 26.0 - 34.0 pg   MCHC 31.0 30.0 - 36.0 g/dL   RDW 14.5 11.5 - 15.5 %   Platelets 257 150 - 400 K/uL   Neutrophils Relative % 62 %   Neutro Abs 5.1 1.7 - 7.7 K/uL   Lymphocytes Relative 19 %   Lymphs Abs 1.6 0.7 - 4.0 K/uL   Monocytes Relative 12 %   Monocytes Absolute 1.0 0.1 - 1.0 K/uL   Eosinophils Relative 6 %   Eosinophils Absolute 0.5 0.0 - 0.7 K/uL   Basophils Relative 1 %   Basophils Absolute 0.1 0.0 - 0.1 K/uL   Immature Granulocytes 0 %   Abs Immature Granulocytes 0.0 0.0 - 0.1 K/uL    Comment: Performed at [facility], [address], [place], [place] [address]  Comprehensive metabolic panel     Status: Abnormal   Collection Time: 10/26/17  9:23 PM  Result Value Ref Range   Sodium 133 (L) 135 - 145 mmol/L   Potassium 3.9 3.5 - 5.1 mmol/L   Chloride 104 101 - 111 mmol/L   CO2 21 (L) 22 - 32 mmol/L   Glucose, Bld 90 65 - 99 mg/dL   BUN 11 6 - 20 mg/dL   Creatinine, Ser 1.45 (H) 0.44 - 1.00 mg/dL   Calcium 9.0 8.9 - 10.3 mg/dL   Total Protein 7.4 6.5 - 8.1 g/dL   Albumin 3.3 (L) 3.5 - 5.0 g/dL   AST 26 15 - 41 U/L   ALT 16 14 - 54 U/L   Alkaline Phosphatase 96 38 - 126 U/L   Total Bilirubin 0.5 0.3 - 1.2 mg/dL   GFR calc non Af Amer 34 (L) >60 mL/min   GFR calc Af Amer 39 (L) >60 mL/min    Comment: (NOTE) The eGFR has been calculated using the CKD EPI equation. This calculation has not been validated in all clinical  situations. eGFR's persistently <60 mL/min signify possible Chronic Kidney Disease.    Anion gap 8 5 - 15    Comment: Performed at [facility] [address], [place], [place] [address]  Lipase, blood     Status: None   Collection Time: 10/26/17  9:23 PM  Result Value Ref Range   Lipase 22 11 - 51 U/L    Comment: Performed at [facility], [place] [address], [place], [place] [address]  I-Stat CG4 Lactic Acid, ED     Status: None   Collection Time: 10/26/17  9:30 PM  Result Value Ref Range   Lactic Acid, Venous 1.72 0.5 - 1.9 mmol/L  MRSA PCR Screening     Status: None   Collection Time:  10/27/17  3:38 AM  Result Value Ref Range   MRSA by PCR NEGATIVE NEGATIVE    Comment:        The GeneXpert MRSA Assay (FDA approved for NASAL specimens only), is one component of a comprehensive MRSA colonization surveillance program. It is not intended to diagnose MRSA infection nor to guide or monitor treatment for MRSA infections. Performed at [facility], [place] [address] [place], [place] [address]   Basic metabolic panel     Status: Abnormal   Collection Time: 10/27/17  5:54 AM  Result Value Ref Range   Sodium 140 135 - 145 mmol/L    Comment: DELTA CHECK NOTED   Potassium 4.5 3.5 - 5.1 mmol/L   Chloride 108 101 - 111 mmol/L   CO2 25 22 - 32 mmol/L   Glucose, Bld 79 65 - 99 mg/dL   BUN 10 6 - 20 mg/dL   Creatinine, Ser 1.29 (H) 0.44 - 1.00 mg/dL   Calcium 8.8 (L) 8.9 - 10.3 mg/dL   GFR calc non Af Amer 39 (L) >60 mL/min   GFR calc Af Amer 45 (L) >60 mL/min    Comment: (NOTE) The eGFR has been calculated using the CKD EPI equation. This calculation has not been validated in all clinical situations. eGFR's persistently <60 mL/min signify possible Chronic Kidney Disease.    Anion gap 7 5 - 15    Comment: Performed at [facility] [address], [place], [place] [address]   Ct Abdomen Pelvis W Contrast  Result Date: 10/27/2017 CLINICAL DATA:  Abdominal  pain and distension.  Vomiting. EXAM: CT ABDOMEN AND PELVIS WITH CONTRAST TECHNIQUE: Multidetector CT imaging of the abdomen and pelvis was performed using the standard protocol following bolus administration of intravenous contrast. CONTRAST:  152m OMNIPAQUE IOHEXOL 300 MG/ML  SOLN COMPARISON:  Abdominal CT 08/24/2017 FINDINGS: Lower chest: Breathing motion artifact. Mild lung base scarring. No pleural fluid or consolidation. Hepatobiliary: Decreased hepatic density consistent with steatosis. Postcholecystectomy with stable intra and extrahepatic biliary ductal dilatation. Pancreas: Parenchymal atrophy. No ductal dilatation or inflammation. Spleen: Calcified granuloma.  Normal in size. Adrenals/Urinary Tract: Subcentimeter left adrenal nodularity. Right adrenal gland is normal. No hydronephrosis or perinephric edema. Homogeneous renal enhancement with symmetric excretion on delayed phase imaging. Bilateral low-density renal lesions, majority of which are too small to accurately characterize due to size. Urinary bladder is physiologically distended without wall thickening. Stomach/Bowel: Small hiatal hernia. No abnormal gastric distension. Majority of small bowel is fluid-filled and prominent caliber without transition point. Fluid-filled ascending, distal transverse, descending and rectosigmoid colon which is also prominent caliber. No bowel wall thickening or inflammatory change. Diverticulosis of the distal colon without diverticulitis. Appendix not visualized, surgically absent per history. Vascular/Lymphatic: Calcified bilateral renal artery aneurysms, 7 mm on the left and 6 mm on the right, unchanged allowing for differences in caliper placement. Aortic atherosclerosis without aortic aneurysm. Small retroperitoneal lymph nodes not enlarged by size criteria. Reproductive: Status post hysterectomy. No adnexal masses. Other: No free air or ascites.  No intra-abdominal abscess. Musculoskeletal: Right hip  arthroplasty with expected postsurgical changes in the periarticular soft tissues. No evidence of periarticular abscess or fluid collection. Remote right sacral and pubic bone fracture. IMPRESSION: 1. Fluid-filled large and small bowel, suggestive of generalized ileus or enteritis. No transition point to suggest obstruction or significant bowel inflammation. 2. No other acute abnormality in the abdomen or pelvis. 3. 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 4.  Aortic  Atherosclerosis (ICD10-I70.0). Electronically Signed   By: Jeb Levering M.D.   On: 10/27/2017 00:13   Dg Hip Unilat With Pelvis 2-3 Views Right  Result Date: 10/27/2017 CLINICAL DATA:  History of right hip replacement. EXAM: DG HIP (WITH OR WITHOUT PELVIS) 2-3V RIGHT COMPARISON:  Abdominopelvic CT 1-1/2 hours prior. Pelvic radiograph 09/30/2017 FINDINGS: Right hip arthroplasty in expected alignment. No periprosthetic lucency or fracture. Ghost tracks in the proximal femur from prior right hip hardware. Sclerosis in the right sacrum and right pubic bones on CT not as well demonstrated radiographically. Excreted IV contrast in the urinary bladder. IMPRESSION: Right hip arthroplasty without evident complication. Electronically Signed   By: Jeb Levering M.D.   On: 10/27/2017 01:50      Assessment/Plan Abdominal discomfort, nausea/occasional emesis -the patient's CT is quite underwhelming.  She may have some fluid in her small and large bowel, but there is no evidence of obstruction and minimal evidence to truly suggest an ileus.  She could have some enteritis, but denies any recent sick contacts.  She does state she has intermittently had some of these issues for a couple of months.  She does not think she has ever seen a GI doctor.  She may need to have a GI evaluation.  She does not recall whether she has ever had any follow up colonoscopies etc since her colon surgery many years ago. -there are no surgical  indications.  She can have clear liquids from our standpoint.  Nothing further to add surgically.  Could consider GI evaluation inpatient if she does not improve, or eval as an outpatient if she quickly resolves like she did 3 months ago. -please call if needed.  Henreitta Cea, [facility] 10/27/2017, 10:12 AM Pager: [phone]

## 2017-10-27 NOTE — Care Management Note (Signed)
Case Management Note  Patient Details  Name: [name] MRN: [id number] Date of Birth: Jul 30, 1940  Subjective/Objective:                    Action/Plan:  5-7 rt THA, DC home with [facility] at home for PT    Will need new order for home health PT to resume.  Will continue to follow Expected Discharge Date:                  Expected Discharge Plan:  [place]  In-House Referral:     Discharge planning Services     Post Acute Care Choice:    Choice offered to:     DME Arranged:    DME Agency:     HH Arranged:    [place] Agency:     Status of Service:  In process, will continue to follow  If discussed at Long Length of Stay Meetings, dates discussed:    Additional Comments:  Marilu Favre, RN 10/27/2017, 3:05 PM

## 2017-10-27 NOTE — Progress Notes (Signed)
PROGRESS NOTE    [name]  VQQ:[id number] DOB: 1941-05-21 DOA: 10/26/2017 PCP: Georgann Housekeeper, MD   Brief Narrative:  77 year old female with history of partial colectomy with ostomy and status post reversal greater than 30 years ago, GERD, hypertension, hyperlipidemia, CVA/TIA, COPD, osteoarthritis came to the hospital with complaints of abdominal pain.  Patient was admitted here about a month ago and had undergone right hip intramedullary nailing removal with conversion to total hip arthroplasty by Dr. Eulah Pont on 5/7.  CT of the abdomen pelvis showed minimally dilated small bowel with fluid-filled small and large colon suspicious for ileus.  Patient was evaluated by general surgery who recommended no surgical intervention at this time.   Assessment & Plan:   Principal Problem:   Ileus (HCC) Active Problems:   Chronic pain syndrome   Generalized abdominal discomfort Ileus without any signs of obvious obstruction - Patient evaluated by general surgery-no surgical intervention at this time - CT of the abdomen pelvis does not show any obvious signs of obstruction.  Previously patient has visited ER couple of times recently for disimpaction. -We will start the patient on clear liquid diet and advance as tolerated.  She is refusing NG tube at this time - We will start patient on promotility agent-Reglan to be taken pre-meal - Potassium appears to be normal  Essential hypertension -Continue Norvasc 5 mg daily  CVA/TIA -Continue aspirin 81 mg, pravastatin  History of COPD -Continue bronchodilators as needed  Hyperlipidemia -Continue pravastatin 10)  Peripheral neuropathy -Continue gabapentin 400 mg daily  Depression/anxiety -Continue Xanax, Celexa   DVT prophylaxis: Lovenox Code Status: Full Family Communication:  None  Disposition Plan: TBD. Adv diet as tolerated.   Consultants:   General Surgery   Procedures:   None  Antimicrobials:    None   Subjective: No further bowel movements yet. Denies any abd pain, nausea and vomuiting yet.   Review of Systems Otherwise negative except as per HPI, including: General: Denies fever, chills, night sweats or unintended weight loss. Resp: Denies cough, wheezing, shortness of breath. Cardiac: Denies chest pain, palpitations, orthopnea, paroxysmal nocturnal dyspnea. GI: Denies  diarrhea or constipation GU: Denies dysuria, frequency, hesitancy or incontinence MS: Denies muscle aches, joint pain or swelling Neuro: Denies headache, neurologic deficits (focal weakness, numbness, tingling), abnormal gait Psych: Denies anxiety, depression, SI/HI/AVH Skin: Denies new rashes or lesions ID: Denies sick contacts, exotic exposures, travel  Objective: Vitals:   10/27/17 0045 10/27/17 0209 10/27/17 0259 10/27/17 0552  BP: (!) 119/57 114/65  (!) 91/59  Pulse: 94 91  76  Resp: (!) 25 16    Temp:  99.1 F (37.3 C)  98.8 F (37.1 C)  TempSrc:  Oral  Oral  SpO2: 96% 97%  91%  Weight:      Height:   5\' 4"  (1.626 m)     Intake/Output Summary (Last 24 hours) at 10/27/2017 1211 Last data filed at 10/27/2017 0336 Gross per 24 hour  Intake 60 ml  Output -  Net 60 ml   Filed Weights   10/26/17 2112  Weight: 80.7 kg (178 lb)    Examination:  General exam: Appears calm and comfortable  Respiratory system: Clear to auscultation. Respiratory effort normal. Cardiovascular system: S1 & S2 heard, RRR. No JVD, murmurs, rubs, gallops or clicks. No pedal edema. Gastrointestinal system: Abdomen is nondistended, soft and nontender. No organomegaly or masses felt. diminished bowel sounds heard. Central nervous system: Alert and oriented. No focal neurological deficits. Extremities: Symmetric 5 x 5  power. Skin: No rashes, lesions or ulcers Psychiatry: Judgement and insight appear normal. Mood & affect appropriate.     Data Reviewed:   CBC: Recent Labs  Lab 10/22/17 1232 10/26/17 2123   WBC 7.8 8.2  NEUTROABS  --  5.1  HGB 12.3 12.2  HCT 36.9 39.3  MCV 91.3 94.5  PLT 381 257   Basic Metabolic Panel: Recent Labs  Lab 10/22/17 1232 10/26/17 2123 10/27/17 0554  NA 138 133* 140  K 3.9 3.9 4.5  CL 105 104 108  CO2 25 21* 25  GLUCOSE 107* 90 79  BUN 10 11 10   CREATININE 1.14* 1.45* 1.29*  CALCIUM 9.5 9.0 8.8*   GFR: Estimated Creatinine Clearance: 38.1 mL/min (A) (by C-G formula based on SCr of 1.29 mg/dL (H)). Liver Function Tests: Recent Labs  Lab 10/22/17 1232 10/26/17 2123  AST 62* 26  ALT 23 16  ALKPHOS 121 96  BILITOT 0.7 0.5  PROT 7.8 7.4  ALBUMIN 3.5 3.3*   Recent Labs  Lab 10/22/17 1232 10/26/17 2123  LIPASE 19 22   No results for input(s): AMMONIA in the last 168 hours. Coagulation Profile: No results for input(s): INR, PROTIME in the last 168 hours. Cardiac Enzymes: No results for input(s): CKTOTAL, CKMB, CKMBINDEX, TROPONINI in the last 168 hours. BNP (last 3 results) No results for input(s): PROBNP in the last 8760 hours. HbA1C: No results for input(s): HGBA1C in the last 72 hours. CBG: No results for input(s): GLUCAP in the last 168 hours. Lipid Profile: No results for input(s): CHOL, HDL, LDLCALC, TRIG, CHOLHDL, LDLDIRECT in the last 72 hours. Thyroid Function Tests: No results for input(s): TSH, T4TOTAL, FREET4, T3FREE, THYROIDAB in the last 72 hours. Anemia Panel: No results for input(s): VITAMINB12, FOLATE, FERRITIN, TIBC, IRON, RETICCTPCT in the last 72 hours. Sepsis Labs: Recent Labs  Lab 10/26/17 2130  LATICACIDVEN 1.72    Recent Results (from the past 240 hour(s))  MRSA PCR Screening     Status: None   Collection Time: 10/27/17  3:38 AM  Result Value Ref Range Status   MRSA by PCR NEGATIVE NEGATIVE Final    Comment:        The GeneXpert MRSA Assay (FDA approved for NASAL specimens only), is one component of a comprehensive MRSA colonization surveillance program. It is not intended to diagnose  MRSA infection nor to guide or monitor treatment for MRSA infections. Performed at [facility], [address] [place], [place] [address]          Radiology Studies: Ct Abdomen Pelvis W Contrast  Result Date: 10/27/2017 CLINICAL DATA:  Abdominal pain and distension.  Vomiting. EXAM: CT ABDOMEN AND PELVIS WITH CONTRAST TECHNIQUE: Multidetector CT imaging of the abdomen and pelvis was performed using the standard protocol following bolus administration of intravenous contrast. CONTRAST:  OMNIPAQUE IOHEXOL 300 MG/ML  SOLN COMPARISON:  Abdominal CT 08/24/2017 FINDINGS: Lower chest: Breathing motion artifact. Mild lung base scarring. No pleural fluid or consolidation. Hepatobiliary: Decreased hepatic density consistent with steatosis. Postcholecystectomy with stable intra and extrahepatic biliary ductal dilatation. Pancreas: Parenchymal atrophy. No ductal dilatation or inflammation. Spleen: Calcified granuloma.  Normal in size. Adrenals/Urinary Tract: Subcentimeter left adrenal nodularity. Right adrenal gland is normal. No hydronephrosis or perinephric edema. Homogeneous renal enhancement with symmetric excretion on delayed phase imaging. Bilateral low-density renal lesions, majority of which are too small to accurately characterize due to size. Urinary bladder is physiologically distended without wall thickening. Stomach/Bowel: Small hiatal hernia. No abnormal gastric  distension. Majority of small bowel is fluid-filled and prominent caliber without transition point. Fluid-filled ascending, distal transverse, descending and rectosigmoid colon which is also prominent caliber. No bowel wall thickening or inflammatory change. Diverticulosis of the distal colon without diverticulitis. Appendix not visualized, surgically absent per history. Vascular/Lymphatic: Calcified bilateral renal artery aneurysms, 7 mm on the left and 6 mm on the right, unchanged allowing for differences in caliper placement.  Aortic atherosclerosis without aortic aneurysm. Small retroperitoneal lymph nodes not enlarged by size criteria. Reproductive: Status post hysterectomy. No adnexal masses. Other: No free air or ascites.  No intra-abdominal abscess. Musculoskeletal: Right hip arthroplasty with expected postsurgical changes in the periarticular soft tissues. No evidence of periarticular abscess or fluid collection. Remote right sacral and pubic bone fracture. IMPRESSION: 1. Fluid-filled large and small bowel, suggestive of generalized ileus or enteritis. No transition point to suggest obstruction or significant bowel inflammation. 2. No other acute abnormality in the abdomen or pelvis. 3. 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 4.  Aortic Atherosclerosis (ICD10-I70.0). Electronically Signed   By: Rubye Oaks M.D.   On: 10/27/2017 00:13   Dg Hip Unilat With Pelvis 2-3 Views Right  Result Date: 10/27/2017 CLINICAL DATA:  History of right hip replacement. EXAM: DG HIP (WITH OR WITHOUT PELVIS) 2-3V RIGHT COMPARISON:  Abdominopelvic CT 1-1/2 hours prior. Pelvic radiograph 09/30/2017 FINDINGS: Right hip arthroplasty in expected alignment. No periprosthetic lucency or fracture. Ghost tracks in the proximal femur from prior right hip hardware. Sclerosis in the right sacrum and right pubic bones on CT not as well demonstrated radiographically. Excreted IV contrast in the urinary bladder. IMPRESSION: Right hip arthroplasty without evident complication. Electronically Signed   By: Rubye Oaks M.D.   On: 10/27/2017 01:50        Scheduled Meds: . ALPRAZolam  1 mg Oral QHS  . amLODipine  5 mg Oral Daily  . aspirin EC  81 mg Oral BID  . citalopram  30 mg Oral Daily  . enoxaparin (LOVENOX) injection  40 mg Subcutaneous Q24H  . gabapentin  400 mg Oral Daily  . meloxicam  15 mg Oral Daily  . pantoprazole  80 mg Oral Q1200  . pravastatin  10 mg Oral q1800  . pregabalin  50 mg Oral BID  .  sulfamethoxazole-trimethoprim  1 tablet Oral BID  . traZODone  100 mg Oral QHS   Continuous Infusions: . sodium chloride 125 mL/hr at 10/27/17 1016     LOS: 0 days    I have spent 30 minutes face to face with the patient and on the ward discussing the patients care, assessment, plan and disposition with other care givers. >50% of the time was devoted counseling the patient about the risks and benefits of treatment and coordinating care.     Hawk Mones Joline Maxcy, MD [facility] Pager [phone]   If 7PM-7AM, please contact night-coverage [email or web address] Password [email or web address] 10/27/2017, 12:11 PM

## 2017-10-27 NOTE — H&P (Signed)
History and Physical    [name] ZOX:[id number] DOB: 1941/04/08 DOA: 10/26/2017  PCP: Wenda Low, MD  Patient coming from: Home  I have personally briefly reviewed patient's old medical records in [place]  Chief Complaint: Abd pain  HPI: [name] is a 77 y.o. female with medical history significant of partial colectomy with ostomy s/p reversal.  Patient also had recent hip surgery and is currently taking narcotic pain meds.  Also started on course of bactrim 5/30 for possible surgical site cellulitis.  Patient presents to the ED today with c/o abd pain, crampy, diffuse.  9 episodes of emesis PTA.  Has been having small amounts of water diarrhea today.  Seen 2x for abd pain in past week, rectal disimpaction on 5/29.  H/o constipation and opiate induced constipation.  At baseline patient on numerous laxatives.   ED Course: CT scan shows ileus with liquid stool in colon and small intestine, no specific transition point identified to suggest SBO.  No mention of impaction / constipation.   Review of Systems: As per HPI otherwise 10 point review of systems negative.   Past Medical History:  Diagnosis Date  . Anemia   . B12 deficiency   . Bacteremia 10/07/2014  . Bladder incontinence   . Blind left eye   . Cancer ([place])    skin cancer   . Cataract   . Cervical spine fracture ([place])   . Chest pain 07/29/2013  . Compression fracture    C7,  upper T spine -compression fx  . COPD (chronic obstructive pulmonary disease) ([place])   . DDD (degenerative disc disease), lumbar   . Depression    major  . Diffuse myofascial pain syndrome 03/24/2015  . Dyspnea    at times- when activty  . Fever 10/05/2014  . GERD (gastroesophageal reflux disease)   . Hiatal hernia   . Hyperlipidemia   . Hypertension   . 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 Macular degeneration   . Osteoarthritis   . Osteoporosis   . Pneumonia    years ago  . Reactive airway disease     . Rupture of bowel ([place])   . Sepsis ([place]) 10/08/2014  . Stroke [facility])    "mini stroke"- balance and memory issue  . Stroke ([place])   . Wrist pain, acute 09/10/2012    Past Surgical History:  Procedure Laterality Date  . ABDOMINAL HYSTERECTOMY    . ABDOMINAL SURGERY    . APPENDECTOMY    . BLADDER SURGERY    . CHOLECYSTECTOMY    . COLON SURGERY    . COLOSTOMY REVERSAL    . EYE SURGERY     cataracts  . HIP SURGERY Right 08/2016  . INTRAMEDULLARY (IM) NAIL INTERTROCHANTERIC Right 09/13/2016   Procedure: INTRAMEDULLARY (IM) NAIL INTERTROCHANTRIC RIGHT;  Surgeon: Leandrew Koyanagi, MD;  Location: [facility];  Service: Orthopedics;  Laterality: Right;  . JOINT REPLACEMENT Bilateral   . KNEE SURGERY    . OSTOMY    . RECTOCELE REPAIR    . TOE SURGERY Right    3rd  . TOTAL HIP ARTHROPLASTY Right 09/30/2017   Procedure: RIGHT HIP IM NAIL REMOVAL WITH CONVERSION TO TOTAL HIP ARTHOPLASTY, anterior approach;  Surgeon: Renette Butters, MD;  Location: [facility];  Service: Orthopedics;  Laterality: Right;     reports that she quit smoking about 6 years ago. She has a 45.00 pack-year smoking history. She has never used smokeless tobacco. She reports that she  does not drink alcohol or use drugs.  Allergies  Allergen Reactions  . Ampicillin Anaphylaxis, Nausea Only and Swelling    SEVERE HEADACHE MUSCLE CRAMPS ANGIOEDEMA THRUSH PATIENT HAS HAD A PCN REACTION WITH IMMEDIATE RASH, FACIAL/TONGUE/THROAT SWELLING, SOB, OR LIGHTHEADEDNESS WITH HYPOTENSION:  #  #  YES  #  #  Has patient had a PCN reaction causing severe rash involving mucus membranes or skin necrosis: No Has patient had a PCN reaction that required hospitalization:No Has patient had a PCN reaction occurring within the last 10 years: No.  . Ampicillin-Sulbactam Sodium Anaphylaxis and Other (See Comments)    SEVERE HEADACHE MUSCLE CRAMPS ANGIOEDEMA THRUSH PATIENT HAS HAD A PCN REACTION WITH IMMEDIATE RASH, FACIAL/TONGUE/THROAT SWELLING, SOB, OR  LIGHTHEADEDNESS WITH HYPOTENSION: # # YES # # Has patient had a PCN reaction causing severe rash involving mucus membranes or skin necrosis: No Has patient had a PCN reaction that required hospitalization:No Has patient had a PCN reaction occurring within the last 10 years: No   . Ambien [Zolpidem Tartrate] Nausea And Vomiting and Other (See Comments)    HALLUCINATIONS SWEATING  . Flexeril [Cyclobenzaprine] Other (See Comments)    EXTRAPYRAMIDAL MOVEMENT INVOLUNTARY MUSCLE JERKING  . Tape Itching, Rash and Other (See Comments)    Paper tape only. Adhesive tape=itching/burning/rash  . Toradol [Ketorolac Tromethamine] Nausea Only and Other (See Comments)    HEADACHE  BACKACHE  . Sulfamethoxazole-Trimethoprim     UNSPECIFIED REACTION   . Cephalexin Rash and Other (See Comments)    HEADACHES  . Percocet [Oxycodone-Acetaminophen] Itching and Rash    Percocet is OK, "itches but takes Benadryl"  . Tramadol Rash and Other (See Comments)    HEADACHE     Family History  Problem Relation Age of Onset  . Heart failure Mother   . Heart attack Father      Prior to Admission medications   Medication Sig Start Date End Date Taking? Authorizing Provider  albuterol (ACCUNEB) 1.25 MG/3ML nebulizer solution Take 1 ampule by nebulization every 6 (six) hours as needed for wheezing.   Yes [provider]  albuterol (PROVENTIL HFA;VENTOLIN HFA) 108 (90 BASE) MCG/ACT inhaler Inhale 2 puffs into the lungs every 6 (six) hours as needed for wheezing or shortness of breath.    Yes [provider]  alendronate (FOSAMAX) 70 MG tablet Take 70 mg by mouth every Monday. Take with a full glass of water on an empty stomach.   Yes [provider]  ALPRAZolam Duanne Moron) 1 MG tablet Take 1 mg by mouth at bedtime.  02/03/17  Yes [provider]  amLODipine (NORVASC) 5 MG tablet Take 5 mg by mouth daily.  04/06/13  Yes [provider]  aspirin EC 81 MG tablet Take 1 tablet (81  mg total) by mouth 2 (two) times daily. For DVT prophylaxis 10/02/17  Yes Martensen, Charna Elizabeth III, PA-C  Calcium-Phosphorus-Vitamin D (CALCIUM/D3 ADULT GUMMIES PO) Take 1 tablet by mouth daily.   Yes [provider]  citalopram (CELEXA) 20 MG tablet Take 30 mg by mouth daily.  02/27/17  Yes [provider]  docusate sodium (COLACE) 100 MG capsule Take 300 mg by mouth 2 (two) times daily as needed for moderate constipation (take 3 capsules scheduled EVERY morning & 3 capsules in the evening as needed for constipation).    Yes [provider]  esomeprazole (NEXIUM) 40 MG capsule Take 40 mg by mouth 2 (two) times daily before a meal.    Yes [provider]  Ferrous Sulfate (IRON) 325 (65 Fe) MG TABS Take 1 tablet (325 mg total) by mouth 2 (two) times daily. 10/01/17  Yes Prudencio Burly III, PA-C  gabapentin (NEURONTIN) 400 MG capsule Take 1 capsule (400 mg total) by mouth at bedtime. Patient taking differently: Take 400 mg by mouth daily.  09/16/17 12/15/17 Yes King, Diona Foley, NP  GINKGO BILOBA EXTRACT PO Take 1 tablet by mouth daily.   Yes [provider]  HYDROcodone-acetaminophen (NORCO/VICODIN) 5-325 MG tablet Take 1 tablet by mouth 3 (three) times daily. 10/15/17  Yes [provider]  Javier Docker Oil 350 MG CAPS Take 350 mg by mouth daily.   Yes [provider]  lactose free nutrition (BOOST) LIQD Take 237 mLs by mouth daily.   Yes [provider]  linaclotide (LINZESS) 290 MCG CAPS capsule Take 290 mcg by mouth daily.   Yes [provider]  lovastatin (MEVACOR) 10 MG tablet Take 10 mg by mouth daily. 09/22/14  Yes [provider]  LYRICA 50 MG capsule Take 50 mg by mouth 2 (two) times daily. 10/15/17  Yes [provider]  meloxicam (MOBIC) 15 MG tablet Take 15 mg by mouth daily.  05/23/16  Yes [provider]  Menthol, Topical Analgesic, (BIOFREEZE EX) Apply 1 application topically daily as  needed (muscle pain).   Yes [provider]  Multiple Vitamins-Minerals (MULTIVITAMIN GUMMIES ADULT PO) Take 1 tablet by mouth daily.   Yes [provider]  naloxegol oxalate (MOVANTIK) 12.5 MG TABS tablet Take 1 tablet (12.5 mg total) by mouth daily. To prevent constipation when taking narcotic pain medicine. 09/30/17  Yes Prudencio Burly III, PA-C  ondansetron (ZOFRAN-ODT) 8 MG disintegrating tablet Take 8 mg by mouth every 8 (eight) hours as needed for nausea or vomiting.   Yes [provider]  orphenadrine (NORFLEX) 100 MG tablet Take 1 tablet (100 mg total) by mouth 2 (two) times daily. Patient taking differently: Take 100 mg by mouth 2 (two) times daily as needed for muscle spasms.  09/16/17  Yes King, Diona Foley, NP  polyethylene glycol (MIRALAX / GLYCOLAX) packet Take 17 g by mouth daily as needed (for constipation.).    Yes [provider]  Probiotic Product (PROBIOTIC PO) Take 1 capsule by mouth daily.   Yes [provider]  senna (SENOKOT) 8.6 MG TABS tablet Take 2 tablets by mouth daily as needed (for constipation.).    Yes [provider]  sulfamethoxazole-trimethoprim (BACTRIM DS,SEPTRA DS) 800-160 MG tablet Take 1 tablet by mouth 2 (two) times daily. 10/23/17  Yes [provider]  traZODone (DESYREL) 100 MG tablet Take 100 mg by mouth at bedtime.    Yes [provider]    Physical Exam: Vitals:   10/26/17 2200 10/26/17 2215 10/26/17 2230 10/27/17 0030  BP: (!) 100/53 108/66 108/64 (!) 124/58  Pulse: 95 87 94 91  Resp: (!) 25 17 19  (!) 23  Temp:      TempSrc:      SpO2: 98% 94% 98% 95%  Weight:      Height:        Constitutional: NAD, calm, comfortable Eyes: PERRL, lids and conjunctivae normal ENMT: Mucous membranes are moist. Posterior pharynx clear of any exudate or lesions.Normal dentition.  Neck: normal, supple, no masses, no thyromegaly Respiratory: clear to auscultation bilaterally, no  wheezing, no crackles. Normal respiratory effort. No accessory muscle use.  Cardiovascular: Regular rate and rhythm, no murmurs / rubs / gallops. No extremity edema. 2+  pedal pulses. No carotid bruits.  Abdomen: Diffuse tenderness Musculoskeletal: no clubbing / cyanosis. No joint deformity upper and lower extremities. Good ROM, no contractures. Normal muscle tone.  Skin: Mild erythema over incision site for hip replacement. Neurologic: CN 2-12 grossly intact. Sensation intact, DTR normal. Strength 5/5 in all 4.  Psychiatric: Normal judgment and insight. Alert and oriented x 3. Normal mood.    Labs on Admission: I have personally reviewed following labs and imaging studies  CBC: Recent Labs  Lab 10/22/17 1232 10/26/17 2123  WBC 7.8 8.2  NEUTROABS  --  5.1  HGB 12.3 12.2  HCT 36.9 39.3  MCV 91.3 94.5  PLT 381 409   Basic Metabolic Panel: Recent Labs  Lab 10/22/17 1232 10/26/17 2123  NA 138 133*  K 3.9 3.9  CL 105 104  CO2 25 21*  GLUCOSE 107* 90  BUN 10 11  CREATININE 1.14* 1.45*  CALCIUM 9.5 9.0   GFR: Estimated Creatinine Clearance: 33.9 mL/min (A) (by C-G formula based on SCr of 1.45 mg/dL (H)). Liver Function Tests: Recent Labs  Lab 10/22/17 1232 10/26/17 2123  AST 62* 26  ALT 23 16  ALKPHOS 121 96  BILITOT 0.7 0.5  PROT 7.8 7.4  ALBUMIN 3.5 3.3*   Recent Labs  Lab 10/22/17 1232 10/26/17 2123  LIPASE 19 22   No results for input(s): AMMONIA in the last 168 hours. Coagulation Profile: No results for input(s): INR, PROTIME in the last 168 hours. Cardiac Enzymes: No results for input(s): CKTOTAL, CKMB, CKMBINDEX, TROPONINI in the last 168 hours. BNP (last 3 results) No results for input(s): PROBNP in the last 8760 hours. HbA1C: No results for input(s): HGBA1C in the last 72 hours. CBG: No results for input(s): GLUCAP in the last 168 hours. Lipid Profile: No results for input(s): CHOL, HDL, LDLCALC, TRIG, CHOLHDL, LDLDIRECT in the last 72  hours. Thyroid Function Tests: No results for input(s): TSH, T4TOTAL, FREET4, T3FREE, THYROIDAB in the last 72 hours. Anemia Panel: No results for input(s): VITAMINB12, FOLATE, FERRITIN, TIBC, IRON, RETICCTPCT in the last 72 hours. Urine analysis:    Component Value Date/Time   COLORURINE YELLOW 08/24/2017 1502   APPEARANCEUR CLEAR 08/24/2017 1502   LABSPEC 1.042 (H) 08/24/2017 1502   PHURINE 7.0 08/24/2017 1502   GLUCOSEU NEGATIVE 08/24/2017 1502   HGBUR NEGATIVE 08/24/2017 1502   BILIRUBINUR NEGATIVE 08/24/2017 1502   KETONESUR NEGATIVE 08/24/2017 1502   PROTEINUR NEGATIVE 08/24/2017 1502   UROBILINOGEN 0.2 02/20/2015 0605   NITRITE NEGATIVE 08/24/2017 1502   LEUKOCYTESUR NEGATIVE 08/24/2017 1502    Radiological Exams on Admission: Ct Abdomen Pelvis W Contrast  Result Date: 10/27/2017 CLINICAL DATA:  Abdominal pain and distension.  Vomiting. EXAM: CT ABDOMEN AND PELVIS WITH CONTRAST TECHNIQUE: Multidetector CT imaging of the abdomen and pelvis was performed using the standard protocol following bolus administration of intravenous contrast. CONTRAST:  132mL OMNIPAQUE IOHEXOL 300 MG/ML  SOLN COMPARISON:  Abdominal CT 08/24/2017 FINDINGS: Lower chest: Breathing motion artifact. Mild lung base scarring. No pleural fluid or consolidation. Hepatobiliary: Decreased hepatic density consistent with steatosis. Postcholecystectomy with stable intra and extrahepatic biliary ductal dilatation. Pancreas: Parenchymal atrophy. No ductal dilatation or inflammation. Spleen: Calcified granuloma.  Normal in size. Adrenals/Urinary Tract: Subcentimeter left adrenal nodularity. Right adrenal gland is normal. No hydronephrosis or perinephric edema. Homogeneous renal enhancement with symmetric excretion on delayed phase imaging. Bilateral low-density renal lesions, majority of which are too small to accurately characterize due to size. Urinary bladder is physiologically distended without wall  thickening.  Stomach/Bowel: Small hiatal hernia. No abnormal gastric distension. Majority of small bowel is fluid-filled and prominent caliber without transition point. Fluid-filled ascending, distal transverse, descending and rectosigmoid colon which is also prominent caliber. No bowel wall thickening or inflammatory change. Diverticulosis of the distal colon without diverticulitis. Appendix not visualized, surgically absent per history. Vascular/Lymphatic: Calcified bilateral renal artery aneurysms, 7 mm on the left and 6 mm on the right, unchanged allowing for differences in caliper placement. Aortic atherosclerosis without aortic aneurysm. Small retroperitoneal lymph nodes not enlarged by size criteria. Reproductive: Status post hysterectomy. No adnexal masses. Other: No free air or ascites.  No intra-abdominal abscess. Musculoskeletal: Right hip arthroplasty with expected postsurgical changes in the periarticular soft tissues. No evidence of periarticular abscess or fluid collection. Remote right sacral and pubic bone fracture. IMPRESSION: 1. Fluid-filled large and small bowel, suggestive of generalized ileus or enteritis. No transition point to suggest obstruction or significant bowel inflammation. 2. No other acute abnormality in the abdomen or pelvis. 3. 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 4.  Aortic Atherosclerosis (ICD10-I70.0). Electronically Signed   By: Jeb Levering M.D.   On: 10/27/2017 00:13    EKG: Independently reviewed.  Assessment/Plan Principal Problem:   Ileus (HCC) Active Problems:   Chronic pain syndrome    1. Ileus - 1. DDx includes PBSO without identified transition point on CT scan and Opiate induced bowel dysmotility. 2. NPO 3. IVF 4. NGT recommended a couple of times but patient adamantly refused. 5. Hold all laxatives (constipation not identified on CT scan, disimpacted a couple of days ago, and stool appears "liquid" on CT scan). 6. Gen surg  eval in AM 7. Consider Relistor for opiate induced bowel dysmotility, but want to have gen surg weigh in first.  Relistor is contraindicated if this represents true obstruction. 8. Pain control with morphine PRN 9. Nausea control with zofran. 2. Recent hip replacement - several weeks out now 1. Questionable cellulitis at wound site 1. Will leave on bactrim course that was started on 5/30. 2. Questionable "pop" herd and felt earlier today 1. Will get plain film X rays of hip for reassurance.  DVT prophylaxis: Lovenox Code Status: Full Family Communication: Daughter at bedside Disposition Plan: Home after admit Consults called: None, call gen surgery in AM Admission status: Admit to inpatient   [place], [place] [facility] Pager [phone]  If 7AM-7PM, please contact day team taking care of patient [email or web address] Password [email or web address]  10/27/2017, 1:19 AM

## 2017-10-27 NOTE — Progress Notes (Signed)
Pt vomited 100cc after eating clear liquids for dinner.  4mg  Zofran given IV and MD notified.  [name]

## 2017-10-28 ENCOUNTER — Inpatient Hospital Stay (HOSPITAL_COMMUNITY): Payer: Medicare Other

## 2017-10-28 ENCOUNTER — Other Ambulatory Visit: Payer: Self-pay

## 2017-10-28 MED ORDER — LACTULOSE 10 GM/15ML PO SOLN
20.0000 g | Freq: Three times a day (TID) | ORAL | Status: AC
  Administered 2017-10-28 (×2): 20 g via ORAL
  Filled 2017-10-28 (×3): qty 30

## 2017-10-28 MED ORDER — POLYETHYLENE GLYCOL 3350 17 G PO PACK
17.0000 g | PACK | Freq: Every day | ORAL | Status: DC
  Administered 2017-10-28 – 2017-10-30 (×3): 17 g via ORAL
  Filled 2017-10-28 (×3): qty 1

## 2017-10-28 NOTE — Progress Notes (Addendum)
Orthopedic Progress Note: Patient was seen this morning.   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  S: Sore but pain is tolerable.  She has been up out of bed in the room and to the bathroom.   O:  General: Alert.  NAD.  Asleep on arrival.  Calm, conversant MSK: Good movement right hip joint without pain.  Hardware removal incisions fully healed.  Anterior revision incision with superficial slough and some mild surrounding erythema and swelling.  No purulence, drainage, or odor.  A/P: WBAT RLE.  Mobilize. Minimize narcotic medicines Start wet-to-dry dressing daily Continue p.o. antibiotics Follow-up with Dr. Alain Marion in the office after discharge.  Prudencio Burly III, PA-C 10/28/2017 7:34 AM

## 2017-10-28 NOTE — Progress Notes (Signed)
PROGRESS NOTE    [name]  PXT:[id number] DOB: May 08, 1941 DOA: 10/26/2017 PCP: Wenda Low, MD   Brief Narrative:  77 year old female with history of partial colectomy with ostomy and status post reversal greater than 30 years ago, GERD, hypertension, hyperlipidemia, CVA/TIA, COPD, osteoarthritis came to the hospital with complaints of abdominal pain.  Patient was admitted here about a month ago and had undergone right hip intramedullary nailing removal with conversion to total hip arthroplasty by Dr. Percell Miller on 5/7.  CT of the abdomen pelvis showed minimally dilated small bowel with fluid-filled small and large colon suspicious for ileus.  Patient was evaluated by surgery who recommended conservative management at this time.   Assessment & Plan:   Principal Problem:   Ileus ([place]) Active Problems:   Chronic pain syndrome   Generalized abdominal discomfort Ileus without any signs of obvious obstruction - Patient evaluated by general surgery-no surgical intervention at this time - CT scan does not show any obvious signs of obstruction.  She is visited recently ER couple of times for disimpaction.  X-ray this morning shows concerns for moderate amount of stool.  Will start patient on MiraLAX daily along with lactulose 3 times today. -Continue patient on clear diet advance as tolerated. - Reglan pre-meals. 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  Follow-up with Dr. Percell Miller after discharge.  We will get PT/OT here, will need outpatient home PT post  discharge.  DVT prophylaxis: Lovenox Code Status: Full Family Communication:  None  Disposition Plan: TBD.  Advance diet as tolerated  Consultants:   General Surgery   Procedures:   None  Antimicrobials:   None   Subjective: Patient is passing some gas this morning.  No nausea vomiting.  Review of Systems Otherwise negative except as per HPI, including: General = no fevers, chills, dizziness, malaise, fatigue HEENT/EYES = negative for pain, redness, loss of vision, double vision, blurred vision, loss of hearing, sore throat, hoarseness, dysphagia Cardiovascular= negative for chest pain, palpitation, murmurs, lower extremity swelling Respiratory/lungs= negative for shortness of breath, cough, hemoptysis, wheezing, mucus production Gastrointestinal= negative for nausea, vomiting,, abdominal pain, melena, hematemesis Genitourinary= negative for Dysuria, Hematuria, Change in Urinary Frequency MSK = Negative for arthralgia, myalgias, Back Pain, Joint swelling  Neurology= Negative for headache, seizures, numbness, tingling  Psychiatry= Negative for anxiety, depression, suicidal and homocidal ideation Allergy/Immunology= Medication/Food allergy as listed  Skin= Negative for Rash, lesions, ulcers, itching   Objective: Vitals:   10/27/17 0552 10/27/17 1340 10/27/17 2141 10/28/17 0604  BP: (!) 91/59 112/68 (!) 100/50 (!) 117/55  Pulse: 76 72 74 68  Resp:  18 17 17   Temp: 98.8 F (37.1 C) 98.4 F (36.9 C) 98.6 F (37 C) 99 F (37.2 C)  TempSrc: Oral Oral Oral Oral  SpO2: 91% 98% 92% 92%  Weight:      Height:        Intake/Output Summary (Last 24 hours) at 10/28/2017 1101 Last data filed at 10/27/2017 2100 Gross per 24 hour  Intake 740 ml  Output 100 ml  Net 640 ml   Filed Weights   10/26/17 2112  Weight: 80.7 kg (178 lb)  Examination: Constitutional: NAD, calm, comfortable Eyes: PERRL, lids and conjunctivae normal ENMT: Mucous membranes are moist. Posterior  pharynx clear of any exudate or lesions.Normal dentition.  Neck: normal, supple, no masses, no thyromegaly Respiratory: clear to auscultation bilaterally, no wheezing, no crackles. Normal respiratory effort. No accessory muscle use.  Cardiovascular: Regular rate and rhythm, no murmurs / rubs / gallops. No extremity edema. 2+ pedal pulses. No carotid bruits.  Abdomen: no tenderness, no masses palpated. No hepatosplenomegaly.  Diminished bowel sounds Musculoskeletal: no clubbing / cyanosis. No joint deformity upper and lower extremities. Good ROM, no contractures. Normal muscle tone.  Skin: Hip dressing is in place Neurologic: CN 2-12 grossly intact. Sensation intact, DTR normal. Strength 5/5 in all 4.  Psychiatric: Normal judgment and insight. Alert and oriented x 3. Normal mood.   Data Reviewed:   CBC: Recent Labs  Lab 10/22/17 1232 10/26/17 2123  WBC 7.8 8.2  NEUTROABS  --  5.1  HGB 12.3 12.2  HCT 36.9 39.3  MCV 91.3 94.5  PLT 381 073   Basic Metabolic Panel: Recent Labs  Lab 10/22/17 1232 10/26/17 2123 10/27/17 0554  NA 138 133* 140  K 3.9 3.9 4.5  CL 105 104 108  CO2 25 21* 25  GLUCOSE 107* 90 79  BUN 10 11 10   CREATININE 1.14* 1.45* 1.29*  CALCIUM 9.5 9.0 8.8*   GFR: Estimated Creatinine Clearance: 38.1 mL/min (A) (by C-G formula based on SCr of 1.29 mg/dL (H)). Liver Function Tests: Recent Labs  Lab 10/22/17 1232 10/26/17 2123  AST 62* 26  ALT 23 16  ALKPHOS 121 96  BILITOT 0.7 0.5  PROT 7.8 7.4  ALBUMIN 3.5 3.3*   Recent Labs  Lab 10/22/17 1232 10/26/17 2123  LIPASE 19 22   No results for input(s): AMMONIA in the last 168 hours. Coagulation Profile: No results for input(s): INR, PROTIME in the last 168 hours. Cardiac Enzymes: No results for input(s): CKTOTAL, CKMB, CKMBINDEX, TROPONINI in the last 168 hours. BNP (last 3 results) No results for input(s): PROBNP in the last 8760 hours. HbA1C: No results for input(s): HGBA1C in the last 72  hours. CBG: No results for input(s): GLUCAP in the last 168 hours. Lipid Profile: No results for input(s): CHOL, HDL, LDLCALC, TRIG, CHOLHDL, LDLDIRECT in the last 72 hours. Thyroid Function Tests: No results for input(s): TSH, T4TOTAL, FREET4, T3FREE, THYROIDAB in the last 72 hours. Anemia Panel: No results for input(s): VITAMINB12, FOLATE, FERRITIN, TIBC, IRON, RETICCTPCT in the last 72 hours. Sepsis Labs: Recent Labs  Lab 10/26/17 2130  LATICACIDVEN 1.72    Recent Results (from the past 240 hour(s))  MRSA PCR Screening     Status: None   Collection Time: 10/27/17  3:38 AM  Result Value Ref Range Status   MRSA by PCR NEGATIVE NEGATIVE Final    Comment:        The GeneXpert MRSA Assay (FDA approved for NASAL specimens only), is one component of a comprehensive MRSA colonization surveillance program. It is not intended to diagnose MRSA infection nor to guide or monitor treatment for MRSA infections. Performed at [facility], [place] [address] [place], [place] [address]          Radiology Studies: Ct Abdomen Pelvis W Contrast  Result Date: 10/27/2017 CLINICAL DATA:  Abdominal pain and distension.  Vomiting. EXAM: CT ABDOMEN AND PELVIS WITH CONTRAST TECHNIQUE: Multidetector CT imaging of the abdomen and pelvis was performed using the standard protocol following bolus administration of intravenous contrast.  CONTRAST:  17mL OMNIPAQUE IOHEXOL 300 MG/ML  SOLN COMPARISON:  Abdominal CT 08/24/2017 FINDINGS: Lower chest: Breathing motion artifact. Mild lung base scarring. No pleural fluid or consolidation. Hepatobiliary: Decreased hepatic density consistent with steatosis. Postcholecystectomy with stable intra and extrahepatic biliary ductal dilatation. Pancreas: Parenchymal atrophy. No ductal dilatation or inflammation. Spleen: Calcified granuloma.  Normal in size. Adrenals/Urinary Tract: Subcentimeter left adrenal nodularity. Right adrenal gland is normal. No hydronephrosis or  perinephric edema. Homogeneous renal enhancement with symmetric excretion on delayed phase imaging. Bilateral low-density renal lesions, majority of which are too small to accurately characterize due to size. Urinary bladder is physiologically distended without wall thickening. Stomach/Bowel: Small hiatal hernia. No abnormal gastric distension. Majority of small bowel is fluid-filled and prominent caliber without transition point. Fluid-filled ascending, distal transverse, descending and rectosigmoid colon which is also prominent caliber. No bowel wall thickening or inflammatory change. Diverticulosis of the distal colon without diverticulitis. Appendix not visualized, surgically absent per history. Vascular/Lymphatic: Calcified bilateral renal artery aneurysms, 7 mm on the left and 6 mm on the right, unchanged allowing for differences in caliper placement. Aortic atherosclerosis without aortic aneurysm. Small retroperitoneal lymph nodes not enlarged by size criteria. Reproductive: Status post hysterectomy. No adnexal masses. Other: No free air or ascites.  No intra-abdominal abscess. Musculoskeletal: Right hip arthroplasty with expected postsurgical changes in the periarticular soft tissues. No evidence of periarticular abscess or fluid collection. Remote right sacral and pubic bone fracture. IMPRESSION: 1. Fluid-filled large and small bowel, suggestive of generalized ileus or enteritis. No transition point to suggest obstruction or significant bowel inflammation. 2. No other acute abnormality in the abdomen or pelvis. 3. 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 4.  Aortic Atherosclerosis (ICD10-I70.0). Electronically Signed   By: Jeb Levering M.D.   On: 10/27/2017 00:13   Dg Abd Portable 1v  Result Date: 10/28/2017 CLINICAL DATA:  Abdominal pain and distension EXAM: PORTABLE ABDOMEN - 1 VIEW COMPARISON:  CT abdomen and pelvis October 26, 2017 FINDINGS: Small calcified renal artery  aneurysms are noted, also documented on recent CT. There are calcified splenic granulomas. There is moderate stool in the colon. There is no bowel dilatation or air-fluid level to suggest bowel obstruction. No free air. There is a total hip replacement on the right. IMPRESSION: No bowel obstruction or free air. Moderate stool in colon. Calcified splenic granulomas noted as well as small bilateral renal artery aneurysms, better delineated on recent CT. Electronically Signed   By: Lowella Grip III M.D.   On: 10/28/2017 09:13   Dg Hip Unilat With Pelvis 2-3 Views Right  Result Date: 10/27/2017 CLINICAL DATA:  History of right hip replacement. EXAM: DG HIP (WITH OR WITHOUT PELVIS) 2-3V RIGHT COMPARISON:  Abdominopelvic CT 1-1/2 hours prior. Pelvic radiograph 09/30/2017 FINDINGS: Right hip arthroplasty in expected alignment. No periprosthetic lucency or fracture. Ghost tracks in the proximal femur from prior right hip hardware. Sclerosis in the right sacrum and right pubic bones on CT not as well demonstrated radiographically. Excreted IV contrast in the urinary bladder. IMPRESSION: Right hip arthroplasty without evident complication. Electronically Signed   By: Jeb Levering M.D.   On: 10/27/2017 01:50        Scheduled Meds: . ALPRAZolam  1 mg Oral QHS  . amLODipine  5 mg Oral Daily  . aspirin EC  81 mg Oral BID  . citalopram  30 mg Oral Daily  . enoxaparin (LOVENOX) injection  40 mg Subcutaneous Q24H  . gabapentin  400 mg  Oral Daily  . meloxicam  15 mg Oral Daily  . metoCLOPramide  5 mg Oral TID AC & HS  . pantoprazole  80 mg Oral Q1200  . pravastatin  10 mg Oral q1800  . pregabalin  50 mg Oral BID  . sulfamethoxazole-trimethoprim  1 tablet Oral BID  . traZODone  100 mg Oral QHS   Continuous Infusions:    LOS: 1 day    I have spent 30 minutes face to face with the patient and on the ward discussing the patients care, assessment, plan and disposition with other care givers. >50% of  the time was devoted counseling the patient about the risks and benefits of treatment and coordinating care.     Ankit Arsenio Loader, MD [facility] Pager [phone]   If 7PM-7AM, please contact night-coverage [email or web address] Password [email or web address] 10/28/2017, 11:01 AM

## 2017-10-28 NOTE — Evaluation (Addendum)
Physical Therapy Evaluation Patient Details Name: [name] MRN: [id number] DOB: 1941-01-16 Today's Date: 10/28/2017   History of Present Illness  77 year old female with history of partial colectomy with ostomy and status post reversal greater than 30 years ago, GERD, hypertension, hyperlipidemia, CVA/TIA, COPD, osteoarthritis came to the hospital with complaints of abdominal pain.  Patient was admitted here about a month ago and had undergone right hip intramedullary nailing removal with conversion to total hip arthroplasty by Dr. Eulah Pont on 5/7.  CT of the abdomen pelvis showed minimally dilated small bowel with fluid-filled small and large colon suspicious for ileus.77 year old female with history of partial colectomy with ostomy and status post reversal greater than 30 years ago, GERD, hypertension, hyperlipidemia, CVA/TIA, COPD, osteoarthritis came to the hospital with complaints of abdominal pain.  Patient was admitted here about a month ago and had undergone right hip intramedullary nailing removal with conversion to total hip arthroplasty by Dr. Eulah Pont on 5/7.  CT of the abdomen pelvis showed minimally dilated small bowel with fluid-filled small and large colon suspicious for ileus.  Clinical Impression  PTA pt receiving HHPT s/p R THA, ambulating with RW household distances and minimal assistance for ADLs from daughter. Pt is limited in safe mobility by increased impulsivity, decreased safety awareness, and decreased strength and balance. Pt currently mod I for bed mobiliy, min A for transfers to RW and minA for ambulation of 200 feet with RW. PT recommends HHPT level rehab at d/c to continue to work on strengthening and balance and to improve safe mobility in her home environment. Pt requests same HHPT as she had in the past if possible. PT will continue to follow acutely.    Follow Up Recommendations Home health PT;Supervision/Assistance - 24 hour    Equipment Recommendations  None  recommended by PT    Recommendations for Other Services       Precautions / Restrictions Precautions Precautions: Fall Restrictions Weight Bearing Restrictions: Yes RLE Weight Bearing: Weight bearing as tolerated      Mobility  Bed Mobility Overal bed mobility: Modified Independent             General bed mobility comments: increased time and effort  Transfers Overall transfer level: Needs assistance Equipment used: Rolling walker (2 wheeled) Transfers: Sit to/from Stand Sit to Stand: Min assist         General transfer comment: minA for power up and steadying, vc for proper hand placement for safe power up   Ambulation/Gait Ambulation/Gait assistance: Min assist;+2 safety/equipment Ambulation Distance (Feet): 200 Feet Assistive device: Rolling walker (2 wheeled) Gait Pattern/deviations: Step-through pattern;Drifts right/left;Trunk flexed;Narrow base of support;Decreased weight shift to right Gait velocity: slowed Gait velocity interpretation: <1.8 ft/sec, indicate of risk for recurrent falls General Gait Details: minA for steadying with RW, pt with decreased attention to task at hand, requires constant cuing for proximity to RW, increased BoS, upright posture, pt with decreased compliance with cuing       Balance Overall balance assessment: Needs assistance Sitting-balance support: No upper extremity supported;Feet supported Sitting balance-Leahy Scale: Fair     Standing balance support: Bilateral upper extremity supported Standing balance-Leahy Scale: Poor Standing balance comment: requires B UE support for safety                             Pertinent Vitals/Pain Pain Assessment: Faces Faces Pain Scale: Hurts a little bit Pain Location: stomach  Pain Descriptors / Indicators: Aching;Sore Pain  Intervention(s): Limited activity within patient's tolerance;Monitored during session;Repositioned    Home Living Family/patient expects to be  discharged to:: Private residence Living Arrangements: Children Available Help at Discharge: Family;Available 24 hours/day Type of Home: Mobile home Home Access: Ramped entrance     Home Layout: One level Home Equipment: Walker - 2 wheels;Walker - 4 wheels;Bedside commode;Wheelchair - manual      Prior Function Level of Independence: Independent with assistive device(s)               Hand Dominance   Dominant Hand: Right    Extremity/Trunk Assessment   Upper Extremity Assessment Upper Extremity Assessment: Overall WFL for tasks assessed    Lower Extremity Assessment Lower Extremity Assessment: RLE deficits/detail;LLE deficits/detail RLE Deficits / Details: s/p R THA, ROM WFL, strength grossly assessed at 4+/5 RLE Sensation: WNL RLE Coordination: WNL LLE Deficits / Details: ROM WFL, strength grossly 4/5 LLE Sensation: WNL LLE Coordination: WNL    Cervical / Trunk Assessment Cervical / Trunk Assessment: Kyphotic  Communication   Communication: No difficulties  Cognition Arousal/Alertness: Awake/alert Behavior During Therapy: Impulsive;Restless Overall Cognitive Status: No family/caregiver present to determine baseline cognitive functioning Area of Impairment: Following commands;Safety/judgement;Attention;Problem solving;Awareness                   Current Attention Level: Selective   Following Commands: Follows multi-step commands inconsistently Safety/Judgement: Decreased awareness of safety Awareness: Emergent Problem Solving: Slow processing;Requires verbal cues;Requires tactile cues        General Comments General comments (skin integrity, edema, etc.): VSS         Assessment/Plan    PT Assessment Patient needs continued PT services  PT Problem List Decreased strength;Decreased activity tolerance;Decreased balance;Decreased mobility;Decreased cognition;Decreased knowledge of use of DME;Decreased safety awareness;Decreased knowledge of  precautions       PT Treatment Interventions DME instruction;Gait training;Functional mobility training;Therapeutic activities;Therapeutic exercise;Balance training;Cognitive remediation;Patient/family education    PT Goals (Current goals can be found in the Care Plan section)  Acute Rehab PT Goals Patient Stated Goal: have less pain PT Goal Formulation: With patient Time For Goal Achievement: 11/11/17 Potential to Achieve Goals: Fair    Frequency Min 3X/week    AM-PAC PT "6 Clicks" Daily Activity  Outcome Measure Difficulty turning over in bed (including adjusting bedclothes, sheets and blankets)?: A Little Difficulty moving from lying on back to sitting on the side of the bed? : A Little Difficulty sitting down on and standing up from a chair with arms (e.g., wheelchair, bedside commode, etc,.)?: A Lot Help needed moving to and from a bed to chair (including a wheelchair)?: A Little Help needed walking in hospital room?: A Little Help needed climbing 3-5 steps with a railing? : A Lot 6 Click Score: 16    End of Session Equipment Utilized During Treatment: Gait belt Activity Tolerance: Patient tolerated treatment well Patient left: in bed;with call bell/phone within reach;with bed alarm set Nurse Communication: Mobility status PT Visit Diagnosis: Unsteadiness on feet (R26.81);Other abnormalities of gait and mobility (R26.89);Muscle weakness (generalized) (M62.81);Difficulty in walking, not elsewhere classified (R26.2);Pain Pain - part of body: (stomach)    Time: 1610-9604 PT Time Calculation (min) (ACUTE ONLY): 27 min   Charges:   PT Evaluation $PT Eval Moderate Complexity: 1 Mod PT Treatments $Gait Training: 8-22 mins   PT G Codes:        [name] B. Beverely Risen PT, DPT Acute Rehabilitation  [phone] Pager [phone]  Elon Alas Fleet 10/28/2017, 3:25 PM

## 2017-10-29 DIAGNOSIS — Z96641 Presence of right artificial hip joint: Secondary | ICD-10-CM

## 2017-10-29 DIAGNOSIS — K567 Ileus, unspecified: Principal | ICD-10-CM

## 2017-10-29 DIAGNOSIS — G894 Chronic pain syndrome: Secondary | ICD-10-CM

## 2017-10-29 LAB — CBC
HCT: 29.8 % — ABNORMAL LOW (ref 36.0–46.0)
Hemoglobin: 9.2 g/dL — ABNORMAL LOW (ref 12.0–15.0)
MCH: 29.4 pg (ref 26.0–34.0)
MCHC: 30.9 g/dL (ref 30.0–36.0)
MCV: 95.2 fL (ref 78.0–100.0)
Platelets: 164 10*3/uL (ref 150–400)
RBC: 3.13 MIL/uL — ABNORMAL LOW (ref 3.87–5.11)
RDW: 14.6 % (ref 11.5–15.5)
WBC: 4.5 10*3/uL (ref 4.0–10.5)

## 2017-10-29 LAB — BASIC METABOLIC PANEL
Anion gap: 3 — ABNORMAL LOW (ref 5–15)
BUN: 5 mg/dL — ABNORMAL LOW (ref 6–20)
CO2: 23 mmol/L (ref 22–32)
Calcium: 8.5 mg/dL — ABNORMAL LOW (ref 8.9–10.3)
Chloride: 114 mmol/L — ABNORMAL HIGH (ref 101–111)
Creatinine, Ser: 1.23 mg/dL — ABNORMAL HIGH (ref 0.44–1.00)
GFR calc Af Amer: 48 mL/min — ABNORMAL LOW (ref >60)
GFR calc non Af Amer: 42 mL/min — ABNORMAL LOW (ref >60)
Glucose, Bld: 76 mg/dL (ref 65–99)
Potassium: 4 mmol/L (ref 3.5–5.1)
Sodium: 140 mmol/L (ref 135–145)

## 2017-10-29 LAB — MAGNESIUM: Magnesium: 1.8 mg/dL (ref 1.7–2.4)

## 2017-10-29 NOTE — Care Management Note (Signed)
Case Management Note  Patient Details  Name: [name] MRN: [id number] Date of Birth: 1940-11-05  Subjective/Objective:                    Action/Plan: Patient from home with home health PT through [facility] and lives with daughter . Discussed with patient she wants to continue using [facility] PT. Will need MD orders and face to face. Will text MD   Expected Discharge Date:                  Expected Discharge Plan:  [place]  In-House Referral:  NA  Discharge planning Services  NA  Post Acute Care Choice:  Home Health Choice offered to:  Patient  DME Arranged:  N/A DME Agency:  NA  HH Arranged:  PT [place] Agency:  [facility] (now [facility])  Status of Service:  In process, will continue to follow  If discussed at Long Length of Stay Meetings, dates discussed:    Additional Comments:  Marilu Favre, RN 10/29/2017, 10:31 AM

## 2017-10-29 NOTE — Progress Notes (Signed)
Physical Therapy Treatment Patient Details Name: [name] MRN: [id number] DOB: Oct 11, 1940 Today's Date: 10/29/2017    History of Present Illness Pt is a 77 y.o. female with PMH of of partial colectomy, HTN, CVA/TIA, COPD, osteoarthriti, admitted 10/26/17 with c/o abdominal pain. CT abdomen/pelvis showed minimally dilated small bowel with fluid-filled small and large colon suspicious for ileus. Of note, pt recently underwent R hip intramedullary nailing removal with conversion to R THA on 09/30/17.   PT Comments    Pt progressing well with mobility. Amb with RW and intermittent min guard for balance, mainly due to pt being impulsive at times with movement requiring cues for safety with gait training. Reviewed RLE therex, pt with good recall of HEP provided by HHPT. Recommend pt continue with HHPT services upon d/c with 24/7 supervision from daughter.   Follow Up Recommendations  Home health PT;Supervision/Assistance - 24 hour     Equipment Recommendations  None recommended by PT    Recommendations for Other Services       Precautions / Restrictions Precautions Precautions: Fall Restrictions Weight Bearing Restrictions: Yes RLE Weight Bearing: Weight bearing as tolerated    Mobility  Bed Mobility Overal bed mobility: Modified Independent                Transfers Overall transfer level: Needs assistance Equipment used: Rolling walker (2 wheeled) Transfers: Sit to/from Stand Sit to Stand: Supervision         General transfer comment: Stood from bed and toilet with RW; good technique  Ambulation/Gait Ambulation/Gait assistance: Min guard;Supervision Ambulation Distance (Feet): 500 Feet Assistive device: Rolling walker (2 wheeled) Gait Pattern/deviations: Step-through pattern;Drifts right/left;Trunk flexed;Decreased weight shift to right Gait velocity: Decreased Gait velocity interpretation: <1.8 ft/sec, indicate of risk for recurrent falls General Gait Details:  Amb with RW and intermittent min guard, cues to maintain proximity to RW for safety, especially with turns. Pt impulsive at times, not consistently following/incorporating cues   Stairs             Wheelchair Mobility    Modified Rankin (Stroke Patients Only)       Balance Overall balance assessment: Needs assistance   Sitting balance-Leahy Scale: Good       Standing balance-Leahy Scale: Poor Standing balance comment: requires B UE support for safety with ambulation, can release walker in static standing                            Cognition Arousal/Alertness: Awake/alert Behavior During Therapy: Impulsive Overall Cognitive Status: Impaired/Different from baseline Area of Impairment: Safety/judgement;Attention                   Current Attention Level: Selective Memory: Decreased short-term memory   Safety/Judgement: Decreased awareness of safety;Decreased awareness of deficits            Exercises General Exercises - Lower Extremity Ankle Circles/Pumps: AROM;Both;10 reps;Seated Long Arc Quad: AROM;Both;20 reps;Seated Hip Flexion/Marching: AROM;Both;10 reps;Seated    General Comments        Pertinent Vitals/Pain Pain Assessment: 0-10 Pain Score: 7  Faces Pain Scale: Hurts little more Pain Location: stomach, R hip Pain Descriptors / Indicators: Aching;Sore Pain Intervention(s): Monitored during session;Patient requesting pain meds-RN notified    Home Living Family/patient expects to be discharged to:: Private residence Living Arrangements: Children Available Help at Discharge: Family;Available 24 hours/day Type of Home: Mobile home Home Access: Ramped entrance   Home Layout: One level Home Equipment: Gilford Rile -  2 wheels;Walker - 4 wheels;Bedside commode;Wheelchair - manual      Prior Function Level of Independence: Needs assistance      Comments: Using Rollator for household ambulation. Daughter assists with R LE dressing and  IADL.   PT Goals (current goals can now be found in the care plan section) Acute Rehab PT Goals Patient Stated Goal: to eat whole foods PT Goal Formulation: With patient Time For Goal Achievement: 11/11/17 Potential to Achieve Goals: Fair Progress towards PT goals: Progressing toward goals    Frequency    Min 3X/week      PT Plan Current plan remains appropriate    Co-evaluation              AM-PAC PT "6 Clicks" Daily Activity  Outcome Measure  Difficulty turning over in bed (including adjusting bedclothes, sheets and blankets)?: None Difficulty moving from lying on back to sitting on the side of the bed? : None Difficulty sitting down on and standing up from a chair with arms (e.g., wheelchair, bedside commode, etc,.)?: A Little Help needed moving to and from a bed to chair (including a wheelchair)?: A Little Help needed walking in hospital room?: A Little Help needed climbing 3-5 steps with a railing? : A Little 6 Click Score: 20    End of Session Equipment Utilized During Treatment: Gait belt Activity Tolerance: Patient tolerated treatment well Patient left: in bed;with call bell/phone within reach Nurse Communication: Mobility status PT Visit Diagnosis: Unsteadiness on feet (R26.81);Other abnormalities of gait and mobility (R26.89);Muscle weakness (generalized) (M62.81);Difficulty in walking, not elsewhere classified (R26.2);Pain     Time: 6834-1962 PT Time Calculation (min) (ACUTE ONLY): 19 min  Charges:  $Gait Training: 8-22 mins                    G Codes:      Mabeline Caras, PT, DPT Acute Rehab Services  Pager: [place] 10/29/2017, 11:52 AM

## 2017-10-29 NOTE — Evaluation (Signed)
Occupational Therapy Evaluation and Discharge Patient Details Name: [name] MRN: [id number] DOB: Dec 06, 1940 Today's Date: 10/29/2017    History of Present Illness 77 year old female with history of partial colectomy with ostomy and status post reversal greater than 30 years ago, GERD, hypertension, hyperlipidemia, CVA/TIA, COPD, osteoarthritis came to the hospital with complaints of abdominal pain.  Patient was admitted here about a month ago and had undergone right hip intramedullary nailing removal with conversion to total hip arthroplasty by Dr. Eulah Pont on 5/7.  CT of the abdomen pelvis showed minimally dilated small bowel with fluid-filled small and large colon suspicious for ileus.77 year old female with history of partial colectomy with ostomy and status post reversal greater than 30 years ago, GERD, hypertension, hyperlipidemia, CVA/TIA, COPD, osteoarthritis came to the hospital with complaints of abdominal pain.  Patient was admitted here about a month ago and had undergone right hip intramedullary nailing removal with conversion to total hip arthroplasty by Dr. Eulah Pont on 5/7.  CT of the abdomen pelvis showed minimally dilated small bowel with fluid-filled small and large colon suspicious for ileus.   Clinical Impression   Pt was assisted for LB bathing and dressing and walking with a rollator since her R hip sx last month. Her daughter is her caregiver. Pt presents with abdominal pain and impaired standing balance. Her safety awareness and memory are impaired, but no family available to determine PLOF. Educated in LB bathing and dressing using AE with pt returning demonstration. No further acute OT needs.    Follow Up Recommendations  No OT follow up;Supervision/Assistance - 24 hour    Equipment Recommendations  None recommended by OT    Recommendations for Other Services       Precautions / Restrictions Precautions Precautions: Fall Restrictions Weight Bearing Restrictions:  Yes RLE Weight Bearing: Weight bearing as tolerated      Mobility Bed Mobility Overal bed mobility: Modified Independent                Transfers Overall transfer level: Needs assistance Equipment used: Rolling walker (2 wheeled) Transfers: Sit to/from Stand Sit to Stand: Supervision         General transfer comment: supervision for safety, good technique    Balance Overall balance assessment: Needs assistance   Sitting balance-Leahy Scale: Good       Standing balance-Leahy Scale: Poor Standing balance comment: requires B UE support for safety with ambulation, can release walker in static standing                           ADL either performed or assessed with clinical judgement   ADL Overall ADL's : Needs assistance/impaired Eating/Feeding: Independent;Sitting   Grooming: Wash/dry hands;Standing;Supervision/safety   Upper Body Bathing: Set up;Sitting   Lower Body Bathing: Sit to/from stand;Minimal assistance Lower Body Bathing Details (indicate cue type and reason): recommended long handled bath sponges Upper Body Dressing : Set up;Sitting   Lower Body Dressing: Minimal assistance;Sit to/from stand Lower Body Dressing Details (indicate cue type and reason): instructed to dress R LE first and in use of sock aide Toilet Transfer: Supervision/safety;Ambulation;RW   Toileting- Clothing Manipulation and Hygiene: Supervision/safety;Sit to/from stand       Functional mobility during ADLs: Supervision/safety;Rolling walker       Vision Patient Visual Report: No change from baseline       Perception     Praxis      Pertinent Vitals/Pain Pain Assessment: Faces Faces Pain Scale: Hurts little  more Pain Location: stomach  Pain Descriptors / Indicators: Aching;Sore Pain Intervention(s): Monitored during session;Repositioned     Hand Dominance Right   Extremity/Trunk Assessment         Cervical / Trunk Assessment Cervical / Trunk  Assessment: Kyphotic   Communication Communication Communication: No difficulties   Cognition Arousal/Alertness: Awake/alert Behavior During Therapy: Impulsive Overall Cognitive Status: Impaired/Different from baseline Area of Impairment: Safety/judgement;Memory                     Memory: Decreased short-term memory   Safety/Judgement: Decreased awareness of safety;Decreased awareness of deficits         General Comments       Exercises     Shoulder Instructions      Home Living Family/patient expects to be discharged to:: Private residence Living Arrangements: Children Available Help at Discharge: Family;Available 24 hours/day Type of Home: Mobile home Home Access: Ramped entrance     Home Layout: One level     Bathroom Shower/Tub: Chief Strategy Officer: Standard     Home Equipment: Environmental consultant - 2 wheels;Walker - 4 wheels;Bedside commode;Wheelchair - manual          Prior Functioning/Environment Level of Independence: Needs assistance        Comments: Using Rollator for household ambulation. Daughter assists with R LE dressing and IADL.        OT Problem List: Impaired balance (sitting and/or standing)      OT Treatment/Interventions:      OT Goals(Current goals can be found in the care plan section) Acute Rehab OT Goals Patient Stated Goal: to eat OT Goal Formulation: With patient  OT Frequency:     Barriers to D/C:            Co-evaluation              AM-PAC PT "6 Clicks" Daily Activity     Outcome Measure Help from another person eating meals?: None Help from another person taking care of personal grooming?: A Little Help from another person toileting, which includes using toliet, bedpan, or urinal?: A Little Help from another person bathing (including washing, rinsing, drying)?: A Little Help from another person to put on and taking off regular upper body clothing?: None Help from another person to put on and  taking off regular lower body clothing?: A Little 6 Click Score: 20   End of Session Equipment Utilized During Treatment: Gait belt;Rolling walker  Activity Tolerance: Patient tolerated treatment well Patient left: in bed;with call bell/phone within reach;with bed alarm set  OT Visit Diagnosis: Other abnormalities of gait and mobility (R26.89);Cognitive communication deficit (R41.841)                Time: 8413-2440 OT Time Calculation (min): 21 min Charges:  OT General Charges $OT Visit: 1 Visit OT Evaluation $OT Eval Moderate Complexity: 1 Mod G-Codes:     November 12, 2017 [name], OTR/L Pager: [phone]  Shelvie Salsberry, [place] B[employer]iff 11/12/17, 10:27 AM

## 2017-10-29 NOTE — Progress Notes (Signed)
PROGRESS NOTE    [name]  DGU:[id number] DOB: May 21, 1941 DOA: 10/26/2017 PCP: Wenda Low, MD    Brief Narrative:  77 year old female with history of partial colectomy with ostomy and status post reversal greater than 30 years ago, GERD, hypertension, hyperlipidemia, CVA/TIA, COPD, osteoarthritis came to the hospital with complaints of abdominal pain.  Patient was admitted here about a month ago and had undergone right hip intramedullary nailing removal with conversion to total hip arthroplasty by Dr. Percell Miller on 5/7.  CT of the abdomen pelvis showed minimally dilated small bowel with fluid-filled small and large colon suspicious for ileus.  Patient was evaluated by surgery who recommended conservative management at this time.  Assessment & Plan:   Principal Problem:   Ileus ([place]) Active Problems:   Chronic pain syndrome  Generalized abdominal discomfort Ileus without any signs of obvious obstruction - Patient evaluated by general surgery-no surgical intervention at this time - CT scan does not show any obvious signs of obstruction.  She is visited recently ER couple of times for disimpaction.   -Recent X-ray reviewed, with concerns for moderate amount of stool.  -Very good results following MiraLAX with lactulose.  Nursing reported up to 6 bowel movements overnight. -This morning, patient's abdomen appears soft with positive bowel sounds - Reglan pre-meals. -  Patient remains on clear liquid diet.  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  Follow-up with Dr. Percell Miller after discharge.  -Physical therapy recommendations for home health physical therapy.  DVT prophylaxis: Lovenox subcu Code Status: Full code Family Communication: Patient in room, family not at bedside Disposition Plan: [place] home with home health PT if patient tolerates advancing of diet  Consultants:   Orthopedic surgery  General surgery  Procedures:     Antimicrobials: Anti-infectives (From admission, onward)   Start     Dose/Rate Route Frequency Ordered Stop   10/27/17 1000  sulfamethoxazole-trimethoprim (BACTRIM DS,SEPTRA DS) 800-160 MG per tablet 1 tablet     1 tablet Oral 2 times daily 10/27/17 0113 10/30/17 0959       Subjective: Reports multiple bowel movements overnight.  Eager to go home, however still on clear liquid diet  Objective: Vitals:   10/28/17 1957 10/28/17 2113 10/29/17 0606 10/29/17 1352  BP: 118/60 132/77 121/62 104/61  Pulse: 84 78 67 69  Resp: 18 18 16    Temp: 99.1 F (37.3 C) 99.1 F (37.3 C) 99 F (37.2 C) 98.6 F (37 C)  TempSrc:  Oral Oral Oral  SpO2: 95% 93%  97%  Weight:      Height:        Intake/Output Summary (Last 24 hours) at 10/29/2017 1714 Last data filed at 10/29/2017 0915 Gross per 24 hour  Intake 860 ml  Output -  Net 860 ml   Filed Weights   10/26/17 2112  Weight: 80.7 kg (178 lb)    Examination:  General exam: Appears calm and comfortable  Respiratory system: Clear to auscultation. Respiratory effort normal.  Cardiovascular system: S1 & S2 heard, RRR Gastrointestinal system: Abdomen is nondistended, soft and nontender. No organomegaly or masses felt. Normal bowel sounds heard. Central nervous system: Alert and oriented. No focal neurological deficits. Extremities:  Symmetric 5 x 5 power. Skin: No rashes, lesions  Psychiatry: Judgement and insight appear normal. Mood & affect appropriate.   Data Reviewed: I have personally reviewed following labs and imaging studies  CBC: Recent Labs  Lab 10/26/17 2123 10/29/17 0516  WBC 8.2 4.5  NEUTROABS 5.1  --   HGB 12.2 9.2*  HCT 39.3 29.8*  MCV 94.5 95.2  PLT 257 536   Basic Metabolic Panel: Recent Labs  Lab 10/26/17 2123 10/27/17 0554 10/29/17 0516  NA 133* 140 140  K 3.9 4.5 4.0  CL 104 108 114*  CO2 21* 25 23  GLUCOSE 90 79 76  BUN 11 10 5*  CREATININE 1.45* 1.29* 1.23*  CALCIUM 9.0 8.8* 8.5*  MG  --   --  1.8   GFR: Estimated Creatinine Clearance: 40 mL/min (A) (by C-G formula based on SCr of 1.23 mg/dL (H)). Liver Function Tests: Recent Labs  Lab 10/26/17 2123  AST 26  ALT 16  ALKPHOS 96  BILITOT 0.5  PROT 7.4  ALBUMIN 3.3*   Recent Labs  Lab 10/26/17 2123  LIPASE 22   No results for input(s): AMMONIA in the last 168 hours. Coagulation Profile: No results for input(s): INR, PROTIME in the last 168 hours. Cardiac Enzymes: No results for input(s): CKTOTAL, CKMB, CKMBINDEX, TROPONINI in the last 168 hours. BNP (last 3 results) No results for input(s): PROBNP in the last 8760 hours. HbA1C: No results for input(s): HGBA1C in the last 72 hours. CBG: No results for input(s): GLUCAP in the last 168 hours. Lipid Profile: No results for input(s): CHOL, HDL, LDLCALC, TRIG, CHOLHDL, LDLDIRECT in the last 72 hours. Thyroid Function Tests: No results for input(s): TSH, T4TOTAL, FREET4, T3FREE, THYROIDAB in the last 72 hours. Anemia Panel: No results for input(s): VITAMINB12, FOLATE, FERRITIN, TIBC, IRON, RETICCTPCT in the last 72 hours. Sepsis Labs: Recent Labs  Lab 10/26/17 2130  LATICACIDVEN 1.72    Recent Results (from the past 240 hour(s))  MRSA PCR Screening     Status: None   Collection Time: 10/27/17  3:38 AM  Result Value Ref Range Status   MRSA by PCR NEGATIVE  NEGATIVE Final    Comment:        The GeneXpert MRSA Assay (FDA approved for NASAL specimens only), is one component of a comprehensive MRSA colonization surveillance program. It is not intended to diagnose MRSA infection nor to guide or monitor treatment for MRSA infections. 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 There are calcified splenic granulomas. There is moderate stool in the colon. There is no bowel dilatation or air-fluid level to suggest bowel obstruction. No free air. There is a total hip replacement on the right. IMPRESSION: No bowel obstruction or free air. Moderate stool in colon. Calcified splenic granulomas noted as well as small bilateral renal artery aneurysms, better delineated on recent CT. Electronically Signed   By: Lowella Grip III M.D.   On: 10/28/2017 09:13  Scheduled Meds: . ALPRAZolam  1 mg Oral QHS  . amLODipine  5 mg Oral Daily  . aspirin EC  81 mg Oral BID  . citalopram  30 mg Oral Daily  . enoxaparin (LOVENOX) injection  40 mg Subcutaneous Q24H  . gabapentin  400 mg Oral Daily  . meloxicam  15 mg Oral Daily  . metoCLOPramide  5 mg Oral TID AC & HS  . pantoprazole  80 mg Oral Q1200  . polyethylene glycol  17 g Oral Daily  . pravastatin  10 mg Oral q1800  . pregabalin  50 mg Oral BID  . sulfamethoxazole-trimethoprim  1 tablet Oral BID  . traZODone  100 mg Oral QHS   Continuous Infusions:   LOS: 2 days   Marylu Lund, MD [facility] Pager [phone]  If 7PM-7AM, please contact night-coverage [email or web address] Password [email or web address] 10/29/2017, 5:14 PM

## 2017-10-29 NOTE — Care Management Note (Signed)
Case Management Note  Patient Details  Name: [name] MRN: [id number] Date of Birth: 26-Sep-1940  Subjective/Objective:                    Action/Plan:   Expected Discharge Date:                  Expected Discharge Plan:  [place]  In-House Referral:  NA  Discharge planning Services  NA  Post Acute Care Choice:  Home Health Choice offered to:  Patient  DME Arranged:  N/A DME Agency:  NA  HH Arranged:  PT [place] Agency:  [facility] (now [facility])  Status of Service:  Completed, signed off  If discussed at [employer] of Stay Meetings, dates discussed:    Additional Comments:  Marilu Favre, RN 10/29/2017, 2:23 PM

## 2017-10-30 LAB — BASIC METABOLIC PANEL
Anion gap: 6 (ref 5–15)
BUN: <5 mg/dL — ABNORMAL LOW (ref 6–20)
CO2: 24 mmol/L (ref 22–32)
Calcium: 8.7 mg/dL — ABNORMAL LOW (ref 8.9–10.3)
Chloride: 112 mmol/L — ABNORMAL HIGH (ref 101–111)
Creatinine, Ser: 1.18 mg/dL — ABNORMAL HIGH (ref 0.44–1.00)
GFR calc Af Amer: 51 mL/min — ABNORMAL LOW (ref >60)
GFR calc non Af Amer: 44 mL/min — ABNORMAL LOW (ref >60)
Glucose, Bld: 73 mg/dL (ref 65–99)
Potassium: 4.1 mmol/L (ref 3.5–5.1)
Sodium: 142 mmol/L (ref 135–145)

## 2017-10-30 LAB — CBC
HCT: 32.7 % — ABNORMAL LOW (ref 36.0–46.0)
Hemoglobin: 10.2 g/dL — ABNORMAL LOW (ref 12.0–15.0)
MCH: 29.4 pg (ref 26.0–34.0)
MCHC: 31.2 g/dL (ref 30.0–36.0)
MCV: 94.2 fL (ref 78.0–100.0)
Platelets: 177 10*3/uL (ref 150–400)
RBC: 3.47 MIL/uL — ABNORMAL LOW (ref 3.87–5.11)
RDW: 14.3 % (ref 11.5–15.5)
WBC: 5.1 10*3/uL (ref 4.0–10.5)

## 2017-10-30 LAB — MAGNESIUM: Magnesium: 1.7 mg/dL (ref 1.7–2.4)

## 2017-10-30 NOTE — Progress Notes (Signed)
Patient discharged to home with instructions. 

## 2017-10-30 NOTE — Progress Notes (Signed)
Patient has tendency to be touch her wound , advised not to touch with her dirty hands, always apply clean dressing on her open wound.

## 2017-10-30 NOTE — Discharge Summary (Signed)
Physician Discharge Summary  [name] VOH:[id number] DOB: Aug 01, 1940 DOA: 10/26/2017  PCP: Wenda Low, MD  Admit date: 10/26/2017 Discharge date: 10/30/2017  Admitted From: Home Disposition:  Home  Recommendations for Outpatient Follow-up:  1. Follow up with PCP in 1-2 weeks 2. Follow up with Orthopedic Surgery as scheduled 3. Please ensure regular bowel movements:  Home Health:PT   Discharge Condition:Improved CODE STATUS:Full Diet recommendation:  Regular  Brief/Interim Summary: 77 year old female with history of partial colectomy with ostomy and status post reversal greater than 30 years ago, GERD, hypertension, hyperlipidemia, CVA/TIA, COPD, osteoarthritis came to the hospital with complaints of abdominal pain. Patient was admitted here about a month ago and had undergone right hip intramedullary nailing removal with conversion to total hip arthroplasty by Dr. Percell Miller on 5/7. CT of the abdomen pelvis showed minimally dilated small bowel with fluid-filled small and large colon suspicious for ileus. Patient was evaluated by surgery who recommended conservative management at this time.  Generalized abdominal discomfort Ileus without any signs of obvious obstruction -Patient evaluated by general surgery-no surgical intervention at this time -CT scan does not show any obvious signs of obstruction. She is visited recently ER couple of times for disimpaction.  -Recent X-ray reviewed, with concerns for moderate amount of stool.  -Very good results following MiraLAX with lactulose.  Nursing reported up to 6 bowel movements overnight. -Patient was continued on clear liquid diet which was advanced successfully overnight -Remains stable -Would ensure regular bowel movements in the future  Essential hypertension -Continued Norvasc 5 mg daily -Blood pressure presently stable and controlled  CVA/TIA -Continue aspirin 81 mg, pravastatin -Currently stable at this  time  History of COPD -Lungs clear on auscultation -Continue bronchodilators as needed  Hyperlipidemia -Continue pravastatin as patient tolerates -Appears to be stable  Peripheral neuropathy -Continue gabapentin 400 mg daily -Seems to be stable at this time  Depression/anxiety -Continue Xanax, Celexa as patient tolerates  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 Follow-up with Dr. Percell Miller after discharge.  -Physical therapy recommendations for home health physical therapy    Discharge Diagnoses:  Principal Problem:   Ileus [facility]) Active Problems:   Chronic pain syndrome    Discharge Instructions   Allergies as of 10/30/2017      Reactions   Ampicillin Anaphylaxis, Nausea Only, Swelling   SEVERE HEADACHE MUSCLE CRAMPS ANGIOEDEMA THRUSH PATIENT HAS HAD A PCN REACTION WITH IMMEDIATE RASH, FACIAL/TONGUE/THROAT SWELLING, SOB, OR LIGHTHEADEDNESS WITH HYPOTENSION:  #  #  YES  #  #  Has patient had a PCN reaction causing severe rash involving mucus membranes or skin necrosis: No Has patient had a PCN reaction that required hospitalization:No Has patient had a PCN reaction occurring within the last 10 years: No.   Ampicillin-sulbactam Sodium Anaphylaxis, Other (See Comments)   SEVERE HEADACHE MUSCLE CRAMPS ANGIOEDEMA THRUSH PATIENT HAS HAD A PCN REACTION WITH IMMEDIATE RASH, FACIAL/TONGUE/THROAT SWELLING, SOB, OR LIGHTHEADEDNESS WITH HYPOTENSION: # # YES # # Has patient had a PCN reaction causing severe rash involving mucus membranes or skin necrosis: No Has patient had a PCN reaction that required hospitalization:No Has patient had a PCN reaction occurring within the last 10 years: No   Ambien [zolpidem Tartrate] Nausea And Vomiting, Other (See Comments)   HALLUCINATIONS SWEATING   Flexeril [cyclobenzaprine] Other (See Comments)   EXTRAPYRAMIDAL MOVEMENT INVOLUNTARY  MUSCLE JERKING   Tape Itching, Rash, Other (See Comments)   Paper tape only. Adhesive tape=itching/burning/rash  Toradol [ketorolac Tromethamine] Nausea Only, Other (See Comments)   HEADACHE  BACKACHE   Sulfamethoxazole-trimethoprim    UNSPECIFIED REACTION    Cephalexin Rash, Other (See Comments)   HEADACHES   Percocet [oxycodone-acetaminophen] Itching, Rash   Percocet is OK, "itches but takes Benadryl"   Tramadol Rash, Other (See Comments)   HEADACHE       Medication List    STOP taking these medications   sulfamethoxazole-trimethoprim 800-160 MG tablet Commonly known as:  BACTRIM DS,SEPTRA DS     TAKE these medications   albuterol 108 (90 Base) MCG/ACT inhaler Commonly known as:  PROVENTIL HFA;VENTOLIN HFA Inhale 2 puffs into the lungs every 6 (six) hours as needed for wheezing or shortness of breath.   albuterol 1.25 MG/3ML nebulizer solution Commonly known as:  ACCUNEB Take 1 ampule by nebulization every 6 (six) hours as needed for wheezing.   alendronate 70 MG tablet Commonly known as:  FOSAMAX Take 70 mg by mouth every Monday. Take with a full glass of water on an empty stomach.   ALPRAZolam 1 MG tablet Commonly known as:  XANAX Take 1 mg by mouth at bedtime.   amLODipine 5 MG tablet Commonly known as:  NORVASC Take 5 mg by mouth daily.   aspirin EC 81 MG tablet Take 1 tablet (81 mg total) by mouth 2 (two) times daily. For DVT prophylaxis   BIOFREEZE EX Apply 1 application topically daily as needed (muscle pain).   CALCIUM/D3 ADULT GUMMIES PO Take 1 tablet by mouth daily.   citalopram 20 MG tablet Commonly known as:  CELEXA Take 30 mg by mouth daily.   docusate sodium 100 MG capsule Commonly known as:  COLACE Take 300 mg by mouth 2 (two) times daily as needed for moderate constipation (take 3 capsules scheduled EVERY morning & 3 capsules in the evening as needed for constipation).   esomeprazole 40 MG capsule Commonly known as:  NEXIUM Take 40 mg  by mouth 2 (two) times daily before a meal.   gabapentin 400 MG capsule Commonly known as:  NEURONTIN Take 1 capsule (400 mg total) by mouth at bedtime. What changed:  when to take this   GINKGO BILOBA EXTRACT PO Take 1 tablet by mouth daily.   HYDROcodone-acetaminophen 5-325 MG tablet Commonly known as:  NORCO/VICODIN Take 1 tablet by mouth 3 (three) times daily.   Iron 325 (65 Fe) MG Tabs Take 1 tablet (325 mg total) by mouth 2 (two) times daily.   Krill Oil 350 MG Caps Take 350 mg by mouth daily.   lactose free nutrition Liqd Take 237 mLs by mouth daily.   LINZESS 290 MCG Caps capsule Generic drug:  linaclotide Take 290 mcg by mouth daily.   lovastatin 10 MG tablet Commonly known as:  MEVACOR Take 10 mg by mouth daily.   LYRICA 50 MG capsule Generic drug:  pregabalin Take 50 mg by mouth 2 (two) times daily.   meloxicam 15 MG tablet Commonly known as:  MOBIC Take 15 mg by mouth daily.   MULTIVITAMIN GUMMIES ADULT PO Take 1 tablet by mouth daily.   naloxegol oxalate 12.5 MG Tabs tablet Commonly known as:  MOVANTIK Take 1 tablet (12.5 mg total) by mouth daily. To prevent constipation when taking narcotic pain medicine.   ondansetron 8 MG disintegrating tablet Commonly known as:  ZOFRAN-ODT Take 8 mg by mouth every 8 (eight) hours as needed for nausea or vomiting.   orphenadrine 100 MG tablet Commonly known as:  NORFLEX Take  1 tablet (100 mg total) by mouth 2 (two) times daily. What changed:    when to take this  reasons to take this   polyethylene glycol packet Commonly known as:  MIRALAX / GLYCOLAX Take 17 g by mouth daily as needed (for constipation.).   PROBIOTIC PO Take 1 capsule by mouth daily.   Nicole 8.6 MG Tabs tablet Commonly known as:  SENOKOT Take 2 tablets by mouth daily as needed (for constipation.).   traZODone 100 MG tablet Commonly known as:  DESYREL Take 100 mg by mouth at bedtime.      Follow-up Information    Home,  [facility] Follow up.   Specialty:  Home Health Services Why:  home health PT Contact information: [address] [place] [address] [phone]        Wenda Low, MD. Schedule an appointment as soon as possible for a visit in 1 week(s).   Specialty:  Internal Medicine Contact information: [address] [place] [address] [phone]        Renette Butters, MD. Schedule an appointment as soon as possible for a visit.   Specialty:  Orthopedic Surgery Contact information: [address] [place] [address] [phone]          Allergies  Allergen Reactions  . Ampicillin Anaphylaxis, Nausea Only and Swelling    SEVERE HEADACHE MUSCLE CRAMPS ANGIOEDEMA THRUSH PATIENT HAS HAD A PCN REACTION WITH IMMEDIATE RASH, FACIAL/TONGUE/THROAT SWELLING, SOB, OR LIGHTHEADEDNESS WITH HYPOTENSION:  #  #  YES  #  #  Has patient had a PCN reaction causing severe rash involving mucus membranes or skin necrosis: No Has patient had a PCN reaction that required hospitalization:No Has patient had a PCN reaction occurring within the last 10 years: No.  . Ampicillin-Sulbactam Sodium Anaphylaxis and Other (See Comments)    SEVERE HEADACHE MUSCLE CRAMPS ANGIOEDEMA THRUSH PATIENT HAS HAD A PCN REACTION WITH IMMEDIATE RASH, FACIAL/TONGUE/THROAT SWELLING, SOB, OR LIGHTHEADEDNESS WITH HYPOTENSION: # # YES # # Has patient had a PCN reaction causing severe rash involving mucus membranes or skin necrosis: No Has patient had a PCN reaction that required hospitalization:No Has patient had a PCN reaction occurring within the last 10 years: No   . Ambien [Zolpidem Tartrate] Nausea And Vomiting and Other (See Comments)    HALLUCINATIONS SWEATING  . Flexeril [Cyclobenzaprine] Other (See Comments)    EXTRAPYRAMIDAL MOVEMENT INVOLUNTARY MUSCLE JERKING  . Tape Itching, Rash and Other (See Comments)    Paper tape only. Adhesive  tape=itching/burning/rash  . Toradol [Ketorolac Tromethamine] Nausea Only and Other (See Comments)    HEADACHE  BACKACHE  . Sulfamethoxazole-Trimethoprim     UNSPECIFIED REACTION   . Cephalexin Rash and Other (See Comments)    HEADACHES  . Percocet [Oxycodone-Acetaminophen] Itching and Rash    Percocet is OK, "itches but takes Benadryl"  . Tramadol Rash and Other (See Comments)    HEADACHE     Consultations:  Orthopedic Surgery  Procedures/Studies: Ct Abdomen Pelvis W Contrast  Result Date: 10/27/2017 CLINICAL DATA:  Abdominal pain and distension.  Vomiting. EXAM: CT ABDOMEN AND PELVIS WITH CONTRAST TECHNIQUE: Multidetector CT imaging of the abdomen and pelvis was performed using the standard protocol following bolus administration of intravenous contrast. CONTRAST:  153mL OMNIPAQUE IOHEXOL 300 MG/ML  SOLN COMPARISON:  Abdominal CT 08/24/2017 FINDINGS: Lower chest: Breathing motion artifact. Mild lung base scarring. No pleural fluid or consolidation. Hepatobiliary: Decreased hepatic density consistent with steatosis.  Postcholecystectomy with stable intra and extrahepatic biliary ductal dilatation. Pancreas: Parenchymal atrophy. No ductal dilatation or inflammation. Spleen: Calcified granuloma.  Normal in size. Adrenals/Urinary Tract: Subcentimeter left adrenal nodularity. Right adrenal gland is normal. No hydronephrosis or perinephric edema. Homogeneous renal enhancement with symmetric excretion on delayed phase imaging. Bilateral low-density renal lesions, majority of which are too small to accurately characterize due to size. Urinary bladder is physiologically distended without wall thickening. Stomach/Bowel: Small hiatal hernia. No abnormal gastric distension. Majority of small bowel is fluid-filled and prominent caliber without transition point. Fluid-filled ascending, distal transverse, descending and rectosigmoid colon which is also prominent caliber. No bowel wall thickening or  inflammatory change. Diverticulosis of the distal colon without diverticulitis. Appendix not visualized, surgically absent per history. Vascular/Lymphatic: Calcified bilateral renal artery aneurysms, 7 mm on the left and 6 mm on the right, unchanged allowing for differences in caliper placement. Aortic atherosclerosis without aortic aneurysm. Small retroperitoneal lymph nodes not enlarged by size criteria. Reproductive: Status post hysterectomy. No adnexal masses. Other: No free air or ascites.  No intra-abdominal abscess. Musculoskeletal: Right hip arthroplasty with expected postsurgical changes in the periarticular soft tissues. No evidence of periarticular abscess or fluid collection. Remote right sacral and pubic bone fracture. IMPRESSION: 1. Fluid-filled large and small bowel, suggestive of generalized ileus or enteritis. No transition point to suggest obstruction or significant bowel inflammation. 2. No other acute abnormality in the abdomen or pelvis. 3. 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 4.  Aortic Atherosclerosis (ICD10-I70.0). Electronically Signed   By: Jeb Levering M.D.   On: 10/27/2017 00:13   Dg Abd Portable 1v  Result Date: 10/28/2017 CLINICAL DATA:  Abdominal pain and distension EXAM: PORTABLE ABDOMEN - 1 VIEW COMPARISON:  CT abdomen and pelvis October 26, 2017 FINDINGS: Small calcified renal artery aneurysms are noted, also documented on recent CT. There are calcified splenic granulomas. There is moderate stool in the colon. There is no bowel dilatation or air-fluid level to suggest bowel obstruction. No free air. There is a total hip replacement on the right. IMPRESSION: No bowel obstruction or free air. Moderate stool in colon. Calcified splenic granulomas noted as well as small bilateral renal artery aneurysms, better delineated on recent CT. Electronically Signed   By: Lowella Grip III M.D.   On: 10/28/2017 09:13   Dg C-arm 1-60 Min  Result Date:  09/30/2017 CLINICAL DATA:  77 year old female post total right hip replacement. Initial encounter. EXAM: OPERATIVE right HIP (WITH PELVIS IF PERFORMED) 2 VIEWS Fluoroscopic time: 1 minutes and 28 seconds. TECHNIQUE: Fluoroscopic spot image(s) were submitted for interpretation post-operatively. COMPARISON:  09/13/2016 FINDINGS: Removal of right femoral dynamic sliding type screw and placement of right total hip. On frontal projection, prosthesis appears in satisfactory position without complication noted. IMPRESSION: Post total right hip replacement. Electronically Signed   By: Genia Del M.D.   On: 09/30/2017 13:13   Dg C-arm 1-60 Min  Result Date: 09/30/2017 CLINICAL DATA:  77 year old female post total right hip replacement. Initial encounter. EXAM: OPERATIVE right HIP (WITH PELVIS IF PERFORMED) 2 VIEWS Fluoroscopic time: 1 minutes and 28 seconds. TECHNIQUE: Fluoroscopic spot image(s) were submitted for interpretation post-operatively. COMPARISON:  09/13/2016 FINDINGS: Removal of right femoral dynamic sliding type screw and placement of right total hip. On frontal projection, prosthesis appears in satisfactory position without complication noted. IMPRESSION: Post total right hip replacement. Electronically Signed   By: Genia Del M.D.   On: 09/30/2017 13:13   Dg Hip Port Unilat With  Pelvis 1v Right  Result Date: 09/30/2017 CLINICAL DATA:  History of total hip arthroplasty. EXAM: DG HIP (WITH OR WITHOUT PELVIS) 1V PORT RIGHT COMPARISON:  None. FINDINGS: Patient has undergone RIGHT total hip arthroplasty. No adverse features. IMPRESSION: Satisfactory position and alignment. Electronically Signed   By: Staci Righter M.D.   On: 09/30/2017 20:04   Dg Hip Operative Unilat W Or W/o Pelvis Right  Result Date: 09/30/2017 CLINICAL DATA:  77 year old female post total right hip replacement. Initial encounter. EXAM: OPERATIVE right HIP (WITH PELVIS IF PERFORMED) 2 VIEWS Fluoroscopic time: 1 minutes and 28  seconds. TECHNIQUE: Fluoroscopic spot image(s) were submitted for interpretation post-operatively. COMPARISON:  09/13/2016 FINDINGS: Removal of right femoral dynamic sliding type screw and placement of right total hip. On frontal projection, prosthesis appears in satisfactory position without complication noted. IMPRESSION: Post total right hip replacement. Electronically Signed   By: Genia Del M.D.   On: 09/30/2017 13:13   Dg Hip Unilat With Pelvis 2-3 Views Right  Result Date: 10/27/2017 CLINICAL DATA:  History of right hip replacement. EXAM: DG HIP (WITH OR WITHOUT PELVIS) 2-3V RIGHT COMPARISON:  Abdominopelvic CT 1-1/2 hours prior. Pelvic radiograph 09/30/2017 FINDINGS: Right hip arthroplasty in expected alignment. No periprosthetic lucency or fracture. Ghost tracks in the proximal femur from prior right hip hardware. Sclerosis in the right sacrum and right pubic bones on CT not as well demonstrated radiographically. Excreted IV contrast in the urinary bladder. IMPRESSION: Right hip arthroplasty without evident complication. Electronically Signed   By: Jeb Levering M.D.   On: 10/27/2017 01:50    Subjective: Eager to go home  Discharge Exam: Vitals:   10/29/17 2213 10/30/17 0538  BP: (!) 116/54 131/72  Pulse: 65 70  Resp: 18 15  Temp: 98.7 F (37.1 C) 98.5 F (36.9 C)  SpO2: 98% 95%   Vitals:   10/29/17 1352 10/29/17 2210 10/29/17 2213 10/30/17 0538  BP: 104/61 (!) 116/54 (!) 116/54 131/72  Pulse: 69 72 65 70  Resp:  16 18 15   Temp: 98.6 F (37 C) 99.3 F (37.4 C) 98.7 F (37.1 C) 98.5 F (36.9 C)  TempSrc: Oral Oral Oral Oral  SpO2: 97% 92% 98% 95%  Weight:      Height:        General: Pt is alert, awake, not in acute distress Cardiovascular: RRR, S1/S2 +, no rubs, no gallops Respiratory: CTA bilaterally, no wheezing, no rhonchi Abdominal: Soft, NT, ND, bowel sounds + Extremities: no edema, no cyanosis   The results of significant diagnostics from this  hospitalization (including imaging, microbiology, ancillary and laboratory) are listed below for reference.     Microbiology: Recent Results (from the past 240 hour(s))  MRSA PCR Screening     Status: None   Collection Time: 10/27/17  3:38 AM  Result Value Ref Range Status   MRSA by PCR NEGATIVE NEGATIVE Final    Comment:        The GeneXpert MRSA Assay (FDA approved for NASAL specimens only), is one component of a comprehensive MRSA colonization surveillance program. It is not intended to diagnose MRSA infection nor to guide or monitor treatment for MRSA infections. Performed at [facility], [place] [address], [place], [place] [address]      Labs: BNP (last 3 results) No results for input(s): BNP in the last 8760 hours. Basic Metabolic Panel: Recent Labs  Lab 10/26/17 2123 10/27/17 0554 10/29/17 0516 10/30/17 0718  NA 133* 140 140 142  K 3.9 4.5  4.0 4.1  CL 104 108 114* 112*  CO2 21* 25 23 24   GLUCOSE 90 79 76 73  BUN 11 10 5* <5*  CREATININE 1.45* 1.29* 1.23* 1.18*  CALCIUM 9.0 8.8* 8.5* 8.7*  MG  --   --  1.8 1.7   Liver Function Tests: Recent Labs  Lab 10/26/17 2123  AST 26  ALT 16  ALKPHOS 96  BILITOT 0.5  PROT 7.4  ALBUMIN 3.3*   Recent Labs  Lab 10/26/17 2123  LIPASE 22   No results for input(s): AMMONIA in the last 168 hours. CBC: Recent Labs  Lab 10/26/17 2123 10/29/17 0516 10/30/17 0718  WBC 8.2 4.5 5.1  NEUTROABS 5.1  --   --   HGB 12.2 9.2* 10.2*  HCT 39.3 29.8* 32.7*  MCV 94.5 95.2 94.2  PLT 257 164 177   Cardiac Enzymes: No results for input(s): CKTOTAL, CKMB, CKMBINDEX, TROPONINI in the last 168 hours. BNP: Invalid input(s): POCBNP CBG: No results for input(s): GLUCAP in the last 168 hours. D-Dimer No results for input(s): DDIMER in the last 72 hours. Hgb A1c No results for input(s): HGBA1C in the last 72 hours. Lipid Profile No results for input(s): CHOL, HDL, LDLCALC, TRIG, CHOLHDL, LDLDIRECT in the last 72  hours. Thyroid function studies No results for input(s): TSH, T4TOTAL, T3FREE, THYROIDAB in the last 72 hours.  Invalid input(s): FREET3 Anemia work up No results for input(s): VITAMINB12, FOLATE, FERRITIN, TIBC, IRON, RETICCTPCT in the last 72 hours. Urinalysis    Component Value Date/Time   COLORURINE YELLOW 08/24/2017 1502   APPEARANCEUR CLEAR 08/24/2017 1502   LABSPEC 1.042 (H) 08/24/2017 1502   PHURINE 7.0 08/24/2017 1502   GLUCOSEU NEGATIVE 08/24/2017 1502   HGBUR NEGATIVE 08/24/2017 1502   BILIRUBINUR NEGATIVE 08/24/2017 1502   KETONESUR NEGATIVE 08/24/2017 1502   PROTEINUR NEGATIVE 08/24/2017 1502   UROBILINOGEN 0.2 02/20/2015 0605   NITRITE NEGATIVE 08/24/2017 1502   LEUKOCYTESUR NEGATIVE 08/24/2017 1502   Sepsis Labs Invalid input(s): PROCALCITONIN,  WBC,  LACTICIDVEN Microbiology Recent Results (from the past 240 hour(s))  MRSA PCR Screening     Status: None   Collection Time: 10/27/17  3:38 AM  Result Value Ref Range Status   MRSA by PCR NEGATIVE NEGATIVE Final    Comment:        The GeneXpert MRSA Assay (FDA approved for NASAL specimens only), is one component of a comprehensive MRSA colonization surveillance program. It is not intended to diagnose MRSA infection nor to guide or monitor treatment for MRSA infections. Performed at [facility], [place] [address], [place], [place] [address]    Time spent, 2min  SIGNED:   Marylu Lund, MD  [facility] 10/30/2017, 11:57 AM  If 7PM-7AM, please contact night-coverage [email or web address] Password [email or web address]

## 2017-10-30 NOTE — Care Management Important Message (Signed)
Important Message  Patient Details  Name: [name] MRN: [id number] Date of Birth: [birth year]   Medicare Important Message Given:  Yes    Orbie Pyo 10/30/2017, 2:28 PM

## 2017-11-03 ENCOUNTER — Telehealth: Payer: Self-pay | Admitting: Nurse Practitioner

## 2017-11-03 NOTE — Telephone Encounter (Signed)
Pharmacy got a fraud alert on [employer] when they were going to fill patients script. Please call pharmacy and let them know if it is ok to fill script for this patient.  Msg was left on Sat at 4:15

## 2017-11-03 NOTE — Telephone Encounter (Signed)
Pharmacy given permission to fill script for Hydrocodone.

## 2017-11-24 ENCOUNTER — Ambulatory Visit: Payer: Medicare Other | Admitting: Nurse Practitioner

## 2017-11-25 ENCOUNTER — Ambulatory Visit: Payer: Medicare Other | Admitting: Nurse Practitioner

## 2017-12-09 ENCOUNTER — Other Ambulatory Visit: Payer: Self-pay

## 2017-12-09 ENCOUNTER — Ambulatory Visit: Payer: Medicare Other | Attending: Nurse Practitioner | Admitting: Nurse Practitioner

## 2017-12-09 ENCOUNTER — Encounter: Payer: Self-pay | Admitting: Nurse Practitioner

## 2017-12-09 VITALS — BP 113/55 | HR 73 | Temp 98.1°F | Resp 18 | Ht 65.0 in | Wt 171.0 lb

## 2017-12-09 DIAGNOSIS — M5136 Other intervertebral disc degeneration, lumbar region: Secondary | ICD-10-CM | POA: Diagnosis not present

## 2017-12-09 DIAGNOSIS — Z9049 Acquired absence of other specified parts of digestive tract: Secondary | ICD-10-CM | POA: Insufficient documentation

## 2017-12-09 DIAGNOSIS — J449 Chronic obstructive pulmonary disease, unspecified: Secondary | ICD-10-CM | POA: Diagnosis not present

## 2017-12-09 DIAGNOSIS — E785 Hyperlipidemia, unspecified: Secondary | ICD-10-CM | POA: Insufficient documentation

## 2017-12-09 DIAGNOSIS — M533 Sacrococcygeal disorders, not elsewhere classified: Secondary | ICD-10-CM | POA: Insufficient documentation

## 2017-12-09 DIAGNOSIS — G8929 Other chronic pain: Secondary | ICD-10-CM

## 2017-12-09 DIAGNOSIS — M810 Age-related osteoporosis without current pathological fracture: Secondary | ICD-10-CM | POA: Diagnosis not present

## 2017-12-09 DIAGNOSIS — Z96641 Presence of right artificial hip joint: Secondary | ICD-10-CM | POA: Diagnosis not present

## 2017-12-09 DIAGNOSIS — R0781 Pleurodynia: Secondary | ICD-10-CM | POA: Diagnosis not present

## 2017-12-09 DIAGNOSIS — Z79899 Other long term (current) drug therapy: Secondary | ICD-10-CM | POA: Insufficient documentation

## 2017-12-09 DIAGNOSIS — M25561 Pain in right knee: Secondary | ICD-10-CM | POA: Insufficient documentation

## 2017-12-09 DIAGNOSIS — I129 Hypertensive chronic kidney disease with stage 1 through stage 4 chronic kidney disease, or unspecified chronic kidney disease: Secondary | ICD-10-CM | POA: Insufficient documentation

## 2017-12-09 DIAGNOSIS — M25511 Pain in right shoulder: Secondary | ICD-10-CM | POA: Diagnosis not present

## 2017-12-09 DIAGNOSIS — Z791 Long term (current) use of non-steroidal anti-inflammatories (NSAID): Secondary | ICD-10-CM | POA: Insufficient documentation

## 2017-12-09 DIAGNOSIS — G629 Polyneuropathy, unspecified: Secondary | ICD-10-CM | POA: Diagnosis not present

## 2017-12-09 DIAGNOSIS — E538 Deficiency of other specified B group vitamins: Secondary | ICD-10-CM | POA: Diagnosis not present

## 2017-12-09 DIAGNOSIS — K219 Gastro-esophageal reflux disease without esophagitis: Secondary | ICD-10-CM | POA: Insufficient documentation

## 2017-12-09 DIAGNOSIS — Z5181 Encounter for therapeutic drug level monitoring: Secondary | ICD-10-CM | POA: Diagnosis not present

## 2017-12-09 DIAGNOSIS — M7918 Myalgia, other site: Secondary | ICD-10-CM | POA: Insufficient documentation

## 2017-12-09 DIAGNOSIS — Z87891 Personal history of nicotine dependence: Secondary | ICD-10-CM | POA: Insufficient documentation

## 2017-12-09 DIAGNOSIS — M1288 Other specific arthropathies, not elsewhere classified, other specified site: Secondary | ICD-10-CM | POA: Insufficient documentation

## 2017-12-09 DIAGNOSIS — Z881 Allergy status to other antibiotic agents status: Secondary | ICD-10-CM | POA: Insufficient documentation

## 2017-12-09 DIAGNOSIS — F329 Major depressive disorder, single episode, unspecified: Secondary | ICD-10-CM | POA: Insufficient documentation

## 2017-12-09 DIAGNOSIS — Z79891 Long term (current) use of opiate analgesic: Secondary | ICD-10-CM | POA: Diagnosis not present

## 2017-12-09 DIAGNOSIS — G894 Chronic pain syndrome: Secondary | ICD-10-CM

## 2017-12-09 DIAGNOSIS — M47897 Other spondylosis, lumbosacral region: Secondary | ICD-10-CM | POA: Diagnosis not present

## 2017-12-09 DIAGNOSIS — Z96651 Presence of right artificial knee joint: Secondary | ICD-10-CM | POA: Insufficient documentation

## 2017-12-09 DIAGNOSIS — M47817 Spondylosis without myelopathy or radiculopathy, lumbosacral region: Secondary | ICD-10-CM

## 2017-12-09 DIAGNOSIS — D649 Anemia, unspecified: Secondary | ICD-10-CM | POA: Diagnosis not present

## 2017-12-09 DIAGNOSIS — M25512 Pain in left shoulder: Secondary | ICD-10-CM | POA: Insufficient documentation

## 2017-12-09 DIAGNOSIS — M545 Low back pain: Secondary | ICD-10-CM | POA: Diagnosis not present

## 2017-12-09 DIAGNOSIS — Z885 Allergy status to narcotic agent status: Secondary | ICD-10-CM | POA: Insufficient documentation

## 2017-12-09 DIAGNOSIS — M4802 Spinal stenosis, cervical region: Secondary | ICD-10-CM | POA: Diagnosis not present

## 2017-12-09 DIAGNOSIS — Z8673 Personal history of transient ischemic attack (TIA), and cerebral infarction without residual deficits: Secondary | ICD-10-CM | POA: Insufficient documentation

## 2017-12-09 DIAGNOSIS — N183 Chronic kidney disease, stage 3 (moderate): Secondary | ICD-10-CM | POA: Insufficient documentation

## 2017-12-09 DIAGNOSIS — M25551 Pain in right hip: Secondary | ICD-10-CM | POA: Diagnosis not present

## 2017-12-09 DIAGNOSIS — M792 Neuralgia and neuritis, unspecified: Secondary | ICD-10-CM

## 2017-12-09 DIAGNOSIS — M47816 Spondylosis without myelopathy or radiculopathy, lumbar region: Secondary | ICD-10-CM

## 2017-12-09 MED ORDER — HYDROCODONE-ACETAMINOPHEN 5-325 MG PO TABS: 1.0000 | ORAL_TABLET | Freq: Three times a day (TID) | ORAL | 0 refills | Status: DC

## 2017-12-09 MED ORDER — PREGABALIN 50 MG PO CAPS: 50.0000 mg | ORAL_CAPSULE | Freq: Two times a day (BID) | ORAL | 2 refills | Status: DC

## 2017-12-09 MED ORDER — GABAPENTIN 400 MG PO CAPS: 400.0000 mg | ORAL_CAPSULE | Freq: Every day | ORAL | 2 refills | Status: DC

## 2017-12-09 MED ORDER — ORPHENADRINE CITRATE ER 100 MG PO TB12: 100.0000 mg | ORAL_TABLET | Freq: Two times a day (BID) | ORAL | 2 refills | Status: AC

## 2017-12-09 NOTE — Progress Notes (Signed)
Patient's Name: [name]  MRN: [id number]  Referring Provider: Wenda Low, MD  DOB: 1940/11/14  PCP: Wenda Low, MD  DOS: 12/09/2017  Note by: Vevelyn Francois NP  Service setting: Ambulatory outpatient  Specialty: Interventional Pain Management  Location: [facility] (AMB) Pain Management Facility    Patient type: Established    Primary Reason(s) for Visit: Encounter for prescription drug management. (Level of risk: moderate)  CC: Knee Pain (right) and Back Pain (lower)  HPI  [name] is a 77 y.o. year old, female patient, who comes today for a medication management evaluation. 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[place]); Hypertensive kidney disease, malignant; Chronic low back pain (Tertiary Area of Pain) (Bilateral) (L>R); CAFL (chronic airflow limitation) ([place]); Apoplectic vertigo; Duodenogastric reflux; History of stroke; Hoarseness; Benign essential HTN; Chronic knee pain (Primary Area of Pain) (Right); Depression, major, in partial remission ([place]); Major depression in remission ([place]); Involutional osteoporosis; Pharyngoesophageal dysphagia; 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); Hx of total knee replacement (Bilateral); Infection of the upper respiratory tract; Bed wetting; UTI (urinary tract infection); Long term current use of opiate analgesic; Long term prescription opiate use; Chronic pain syndrome; Chronic neck pain (Secondary Area of Pain) (Bilateral) (L>R); Chronic hip pain (Right); Rib pain; DDD (degenerative disc disease), cervical; Lumbar facet arthropathy (Bilateral);  Chronic myofascial pain; Lumbar facet syndrome (Bilateral) (L>R); Long term prescription benzodiazepine use; Opiate use; Disorder of skeletal system; Pharmacologic therapy; Problems influencing health status; Opioid-induced constipation (OIC); NSAID long-term use; DDD (degenerative disc disease), lumbar; Chronic knee pain (Bilateral) (R>L); Osteoarthritis; Chronic musculoskeletal pain; Neurogenic pain; Chronic knee pain s/p total knee replacement (TKR) (Right); CKD (chronic kidney disease) stage 3, GFR 30-59 ml/min ([place]); Chronic shoulder pain (Left); Non-traumatic compression fracture of T3 thoracic vertebra, sequela; High risk medication use; At high risk for falls; Chronic sacroiliac joint pain (Right); Spondylosis without myelopathy or radiculopathy, lumbosacral region; Other specified dorsopathies, sacral and sacrococcygeal region; Abnormal x-ray of pelvis; SBO (small bowel obstruction) ([place]); History of small bowel obstruction; Delayed union of closed fracture of hip, right; Primary osteoarthritis of hip; and Ileus (HCC) on their problem list. Her primarily concern today is the Knee Pain (right) and Back Pain (lower)  Pain Assessment: Location: Lower Back Radiating: right knee Onset: More than a month ago Duration: Chronic pain Quality: Constant, Throbbing Severity: 6 /10 (subjective, self-reported pain score)  Note: Reported level is compatible with observation. Clinically the patient looks like a 2/10 A 2/10 is viewed as "Mild to Moderate" and described as noticeable and distracting. Impossible to hide from other people. More frequent flare-ups. Still possible to adapt and function close to normal. It can be very annoying and may have occasional stronger flare-ups. With discipline, patients may get used to it and adapt. Information on the proper use of the pain scale provided to the patient today. When using our objective Pain Scale, levels between 6 and 10/10 are said to belong in an emergency  room, as it progressively worsens from a 6/10, described as severely limiting, requiring emergency care not usually available at an outpatient pain management facility. At a 6/10 level, communication becomes difficult and requires great effort. Assistance to reach the emergency department may be required. Facial flushing and  profuse sweating along with potentially dangerous increases in heart rate and blood pressure will be evident.   Timing: Constant Modifying factors: states nothing helps her pain. Medications not helping at all patient states. BP: (!) 113/55  HR: 73  [name] was last scheduled for an appointment on 11/03/2017 for medication management. During today's appointment we reviewed [name]'s chronic pain status, as well as her outpatient medication regimen. She is having right knee pain. She is going to see Ortho, Dr Laureen Ochs. She is SP TKR with revision. She admits that a previous fall has made this work. Her daughter admits that her pain medication was not effective. She admits that the Percocet was more effective.  The patient  reports that she does not use drugs. Her body mass index is 28.46 kg/m.  Further details on both, my assessment(s), as well as the proposed treatment plan, please see below.  Controlled Substance Pharmacotherapy Assessment REMS (Risk Evaluation and Mitigation Strategy)  Analgesic:Oxycodone IR 5 mg 1 tablet by mouth every 8 hours (32m/dayoxycodone) (22.5MME/day) MME/day:22.555mday.   ShHart RochesterRN  12/09/2017 11:13 AM  Sign at close encounter Nursing Pain Medication Assessment:  Safety precautions to be maintained throughout the outpatient stay will include: orient to surroundings, keep bed in low position, maintain call bell within reach at all times, provide assistance with transfer out of bed and ambulation.  Medication Inspection Compliance: Pill count conducted under aseptic conditions, in front of the patient. Neither the pills nor  the bottle was removed from the patient's sight at any time. Once count was completed pills were immediately returned to the patient in their original bottle.  Medication: Hydrocodone/APAP Pill/Patch Count: 0 of 90 pills remain Pill/Patch Appearance: Markings consistent with prescribed medication Bottle Appearance: Standard pharmacy container. Clearly labeled. Filled Date: 05 / 22 / 2019 Last Medication intake:  Ran out of medicine more than 48 hours ago   Pharmacokinetics: Liberation and absorption (onset of action): WNL Distribution (time to peak effect): WNL Metabolism and excretion (duration of action): WNL         Pharmacodynamics: Desired effects: Analgesia: Ms. [place] >50% benefit. Functional ability: Patient reports that medication allows her to accomplish basic ADLs Clinically meaningful improvement in function (CMIF): Sustained CMIF goals met Perceived effectiveness: Described as relatively effective, allowing for increase in activities of daily living (ADL) Undesirable effects: Side-effects or Adverse reactions: None reported Monitoring: [place] PMP: Online review of the past 1286-monthriod conducted. Compliant with practice rules and regulations Last UDS on record: Summary  Date Value Ref Range Status  08/12/2017 FINAL  Final    Comment:    ==================================================================== TOXASSURE SELECT 13 (MW) ==================================================================== Test                             Result       Flag       Units Drug Present and Declared for Prescription Verification   Alprazolam                     132          EXPECTED   ng/mg creat   Alpha-hydroxyalprazolam        139          EXPECTED   ng/mg creat    Source of alprazolam is a scheduled prescription medication.    Alpha-hydroxyalprazolam is an expected metabolite of alprazolam. Drug Absent but Declared for Prescription Verification  Hydrocodone                     Not Detected UNEXPECTED ng/mg creat ==================================================================== Test                      Result    Flag   Units      Ref Range   Creatinine              148              mg/dL      >=20 ==================================================================== Declared Medications:  The flagging and interpretation on this report are based on the  following declared medications.  Unexpected results may arise from  inaccuracies in the declared medications.  **Note: The testing scope of this panel includes these medications:  Alprazolam (Xanax)  Hydrocodone (Norco)  **Note: The testing scope of this panel does not include following  reported medications:  Acetaminophen (Norco)  Albuterol  Alendronate (Fosamax)  Amlodipine (Norvasc)  Baclofen (Lioresal)  Calcium  Citalopram (Celexa)  Cyanocobalamin  Diclofenac (Voltaren)  Docusate (Colace)  Docusate (Senokot-S)  Ferrous Gluconate  Gabapentin  Linaclotide (Linzess)  Lovastatin (Mevacor)  Meloxicam (Mobic)  Menthol  Multivitamin  Omeprazole (Nexium)  Ondansetron (Zofran)  Orphenadrine (Norflex)  Polyethylene Glycol  Promethazine  Sennosides (Senokot-S)  Supplement  Supplement (Krill Oil)  Trazodone (Desyrel) ==================================================================== For clinical consultation, please call [phone]. ====================================================================    UDS interpretation: Compliant          Medication Assessment Form: Reviewed. Patient indicates being compliant with therapy Treatment compliance: Compliant Risk Assessment Profile: Aberrant behavior: non-adherence to medication regimen and questionable stockpiling Comorbid factors increasing risk of overdose: See prior notes. No additional risks detected today Risk of substance use disorder (SUD): Moderate Opioid Risk Tool - 12/09/17 1121      Family History of Substance Abuse    Alcohol  Negative    Illegal Drugs  Negative    Rx Drugs  Negative      Personal History of Substance Abuse   Alcohol  Negative    Illegal Drugs  Negative    Rx Drugs  Negative      Age   Age between 72-45 years   No      History of Preadolescent Sexual Abuse   History of Preadolescent Sexual Abuse  Negative or Female      Psychological Disease   Psychological Disease  Negative    Depression  Negative      Total Score   Opioid Risk Tool Scoring  0    Opioid Risk Interpretation  Low Risk      ORT Scoring interpretation table:  Score <3 = Low Risk for SUD  Score between 4-7 = Moderate Risk for SUD  Score >8 = High Risk for Opioid Abuse   Risk Mitigation Strategies:  Patient Counseling: Covered Patient-Prescriber Agreement (PPA): Present and active  Notification to other healthcare providers: Done  Pharmacologic Plan: No change in therapy, at this time.           Will continue to monitor  Laboratory Chemistry  Inflammation Markers (CRP: Acute Phase) (ESR: Chronic Phase) Lab Results  Component Value Date   CRP <0.8 11/19/2016   ESRSEDRATE 20 11/19/2016   LATICACIDVEN 1.72 10/26/2017                         Rheumatology Markers No results found for: RF, ANA, LABURIC, URICUR,  LYMEIGGIGMAB, LYMEABIGMQN, HLAB27                      Renal Function Markers Lab Results  Component Value Date   BUN <5 (L) 10/30/2017   CREATININE 1.18 (H) 10/30/2017   BCR 6 (L) 12/04/2016   GFRAA 51 (L) 10/30/2017   GFRNONAA 44 (L) 10/30/2017                             Hepatic Function Markers Lab Results  Component Value Date   AST 26 10/26/2017   ALT 16 10/26/2017   ALBUMIN 3.3 (L) 10/26/2017   ALKPHOS 96 10/26/2017   AMYLASE 68 01/25/2007   LIPASE 22 10/26/2017   AMMONIA 66 12/04/2016                        Electrolytes Lab Results  Component Value Date   NA 142 10/30/2017   K 4.1 10/30/2017   CL 112 (H) 10/30/2017   CALCIUM 8.7 (L) 10/30/2017   MG 1.7 10/30/2017                         Neuropathy Markers Lab Results  Component Value Date   VITAMINB12 1,888 (H) 11/19/2016   HGBA1C 4.9 09/19/2017                        Bone Pathology Markers Lab Results  Component Value Date   25OHVITD1 37 11/19/2016   25OHVITD2 <1.0 11/19/2016   25OHVITD3 37 11/19/2016                         Coagulation Parameters Lab Results  Component Value Date   INR 1.00 09/13/2016   LABPROT 13.2 09/13/2016   APTT 34 01/04/2011   PLT 177 10/30/2017   DDIMER 1.71 (H) 10/23/2016                        Cardiovascular Markers Lab Results  Component Value Date   BNP 60.2 01/23/2016   TROPONINI <0.03 05/23/2015   HGB 10.2 (L) 10/30/2017   HCT 32.7 (L) 10/30/2017                         CA Markers No results found for: CEA, CA125, LABCA2                      Note: Lab results reviewed.  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 There are calcified splenic granulomas. There is moderate stool in the colon. There is no bowel dilatation or air-fluid level to suggest bowel obstruction. No free air. There is a total hip replacement on the right.  IMPRESSION: No bowel obstruction or free air. Moderate stool in colon. Calcified splenic granulomas noted as well as small bilateral renal artery aneurysms, better delineated on recent CT.  Electronically Signed   By: Lowella Grip III M.D.   On: 10/28/2017 09:13  Complexity Note: Imaging results reviewed. Results shared with [name], using Layman's terms.  Meds   Current Outpatient Medications:  .  albuterol (ACCUNEB) 1.25 MG/3ML nebulizer solution, Take 1 ampule by nebulization every 6 (six) hours as needed for wheezing., Disp: , Rfl:  .  albuterol  (PROVENTIL HFA;VENTOLIN HFA) 108 (90 BASE) MCG/ACT inhaler, Inhale 2 puffs into the lungs every 6 (six) hours as needed for wheezing or shortness of breath. , Disp: , Rfl:  .  alendronate (FOSAMAX) 70 MG tablet, Take 70 mg by mouth every Monday. Take with a full glass of water on an empty stomach., Disp: , Rfl:  .  ALPRAZolam (XANAX) 1 MG tablet, Take 1 mg by mouth at bedtime. , Disp: , Rfl:  .  amLODipine (NORVASC) 5 MG tablet, Take 5 mg by mouth daily. , Disp: , Rfl:  .  Calcium-Phosphorus-Vitamin D (CALCIUM/D3 ADULT GUMMIES PO), Take 1 tablet by mouth daily., Disp: , Rfl:  .  citalopram (CELEXA) 20 MG tablet, Take 30 mg by mouth daily. , Disp: , Rfl:  .  docusate sodium (COLACE) 100 MG capsule, Take 300 mg by mouth 2 (two) times daily as needed for moderate constipation (take 3 capsules scheduled EVERY morning & 3 capsules in the evening as needed for constipation). , Disp: , Rfl:  .  esomeprazole (NEXIUM) 40 MG capsule, Take 40 mg by mouth 2 (two) times daily before a meal. , Disp: , Rfl:  .  Ferrous Sulfate (IRON) 325 (65 Fe) MG TABS, Take 1 tablet (325 mg total) by mouth 2 (two) times daily., Disp: 60 each, Rfl: 0 .  GINKGO BILOBA EXTRACT PO, Take 1 tablet by mouth daily., Disp: , Rfl:  .  HYDROcodone-acetaminophen (NORCO/VICODIN) 5-325 MG tablet, Take 1 tablet by mouth 3 (three) times daily., Disp: 90 tablet, Rfl: 0 .  [START ON 02/07/2018] HYDROcodone-acetaminophen (NORCO/VICODIN) 5-325 MG tablet, Take 1 tablet by mouth every 8 (eight) hours., Disp: 90 tablet, Rfl: 0 .  [START ON 01/08/2018] HYDROcodone-acetaminophen (NORCO/VICODIN) 5-325 MG tablet, Take 1 tablet by mouth 3 (three) times daily., Disp: 90 tablet, Rfl: 0 .  linaclotide (LINZESS) 290 MCG CAPS capsule, Take 290 mcg by mouth daily., Disp: , Rfl:  .  lovastatin (MEVACOR) 10 MG tablet, Take 10 mg by mouth daily., Disp: , Rfl:  .  LYRICA 50 MG capsule, Take 50 mg by mouth 2 (two) times daily., Disp: , Rfl:  .  meloxicam (MOBIC) 15 MG  tablet, Take 15 mg by mouth daily. , Disp: , Rfl:  .  Menthol, Topical Analgesic, (BIOFREEZE EX), Apply 1 application topically daily as needed (muscle pain)., Disp: , Rfl:  .  Multiple Vitamins-Minerals (MULTIVITAMIN GUMMIES ADULT PO), Take 1 tablet by mouth daily., Disp: , Rfl:  .  ondansetron (ZOFRAN-ODT) 8 MG disintegrating tablet, Take 8 mg by mouth every 8 (eight) hours as needed for nausea or vomiting., Disp: , Rfl:  .  orphenadrine (NORFLEX) 100 MG tablet, Take 1 tablet (100 mg total) by mouth 2 (two) times daily., Disp: 60 tablet, Rfl: 2 .  polyethylene glycol (MIRALAX / GLYCOLAX) packet, Take 17 g by mouth daily as needed (for constipation.). , Disp: , Rfl:  .  pregabalin (LYRICA) 50 MG capsule, Take 1 capsule (50 mg total) by mouth 2 (two) times daily., Disp: 60 capsule, Rfl: 2 .  Probiotic Product (PROBIOTIC PO), Take 1 capsule by mouth daily., Disp: , Rfl:  .  senna (SENOKOT) 8.6 MG TABS tablet, Take 2 tablets by mouth daily as needed (for constipation.). , Disp: , Rfl:  .  traZODone (DESYREL)  100 MG tablet, Take 100 mg by mouth at bedtime. , Disp: , Rfl:  .  lactose free nutrition (BOOST) LIQD, Take 237 mLs by mouth daily., Disp: , Rfl:  .  naloxegol oxalate (MOVANTIK) 12.5 MG TABS tablet, Take 1 tablet (12.5 mg total) by mouth daily. To prevent constipation when taking narcotic pain medicine. (Patient not taking: Reported on 12/09/2017), Disp: 30 tablet, Rfl: 0  ROS  Constitutional: Denies any fever or chills Gastrointestinal: No reported hemesis, hematochezia, vomiting, or acute GI distress Musculoskeletal: Denies any acute onset joint swelling, redness, loss of ROM, or weakness Neurological: No reported episodes of acute onset apraxia, aphasia, dysarthria, agnosia, amnesia, paralysis, loss of coordination, or loss of consciousness  Allergies  [name] is allergic to ampicillin; ampicillin-sulbactam sodium; ambien [zolpidem tartrate]; flexeril [cyclobenzaprine]; tape; toradol  [ketorolac tromethamine]; sulfamethoxazole-trimethoprim; cephalexin; percocet [oxycodone-acetaminophen]; and tramadol.  [name]  Drug: [name]  reports that she does not use drugs. Alcohol:  reports that she does not drink alcohol. Tobacco:  reports that she quit smoking about 6 years ago. Her smoking use included e-cigarettes and cigarettes. She has a 45.00 pack-year smoking history. She has never used smokeless tobacco. Medical:  has a past medical history of Anemia, B12 deficiency, Bacteremia (10/07/2014), Bladder incontinence, Blind left eye, Cancer ([place]), Cataract, Cervical spine fracture ([place]), Chest pain (07/29/2013), Compression fracture, COPD (chronic obstructive pulmonary disease) ([place]), DDD (degenerative disc disease), lumbar, Depression, Diffuse myofascial pain syndrome (03/24/2015), Dyspnea, Fever (10/05/2014), GERD (gastroesophageal reflux disease), Hiatal hernia, Hyperlipidemia, Hypertension, Hypertensive kidney disease, malignant (11/07/2016), Macular degeneration, Osteoarthritis, Osteoporosis, Pneumonia, Reactive airway disease, Rupture of bowel ([place]), Sepsis ([place]) (10/08/2014), Stroke [facility]), Stroke ([place]), and Wrist pain, acute (09/10/2012). Surgical: Ms. Skousen  has a past surgical history that includes Abdominal surgery; Colon surgery; Appendectomy; Bladder surgery; Rectocele repair; Knee surgery; Cholecystectomy; Abdominal hysterectomy; Ostomy; Eye surgery; Intramedullary (im) nail intertrochanteric (Right, 09/13/2016); Hip surgery (Right, 08/2016); Joint replacement (Bilateral); Toe Surgery (Right); Colostomy reversal; and Total hip arthroplasty (Right, 09/30/2017). Family: family history includes Heart attack in her father; Heart failure in her mother.  Constitutional Exam  General appearance: Well nourished, well developed, and well hydrated. In no apparent acute distress Vitals:   12/09/17 1111  BP: (!) 113/55  Pulse: 73  Resp: 18  Temp: 98.1 F (36.7 C)  TempSrc: Oral  SpO2: 96%   Weight: 171 lb (77.6 kg)  Height: '5\' 5"'  (1.651 m)   BMI Assessment: Estimated body mass index is 28.46 kg/m as calculated from the following:   Height as of this encounter: '5\' 5"'  (1.651 m).   Weight as of this encounter: 171 lb (77.6 kg). Psych/Mental status: Alert, oriented x 3 (person, place, & time)       Eyes: PERLA Respiratory: No evidence of acute respiratory distress  Lumbar Spine Area Exam  Skin & Axial Inspection: No masses, redness, or swelling Alignment: Symmetrical Functional ROM: Unrestricted ROM       Stability: No instability detected Muscle Tone/Strength: Functionally intact. No obvious neuro-muscular anomalies detected. Sensory (Neurological): Unimpaired Palpation: No palpable anomalies       Provocative Tests: Lumbar Hyperextension/rotation test: deferred today       Lumbar quadrant test (Kemp's test): deferred today       Lumbar Lateral bending test: deferred today       Patrick's Maneuver: deferred today                   FABER test: deferred today  Thigh-thrust test: deferred today       S-I compression test: deferred today       S-I distraction test: deferred today        Gait & Posture Assessment  Ambulation: Unassisted Gait: Relatively normal for age and body habitus Posture: WNL   Lower Extremity Exam    Side: Right lower extremity  Side: Left lower extremity  Stability: No instability observed          Stability: No instability observed          Skin & Extremity Inspection: Evidence of prior arthroplastic surgery  Skin & Extremity Inspection: Skin color, temperature, and hair growth are WNL. No peripheral edema or cyanosis. No masses, redness, swelling, asymmetry, or associated skin lesions. No contractures.  Functional ROM: Unrestricted ROM                  Functional ROM: Unrestricted ROM                  Muscle Tone/Strength: Functionally intact. No obvious neuro-muscular anomalies detected.  Muscle Tone/Strength: Functionally  intact. No obvious neuro-muscular anomalies detected.  Sensory (Neurological): Unimpaired  Sensory (Neurological): Unimpaired  Palpation: No palpable anomalies  Palpation: No palpable anomalies   Assessment  Primary Diagnosis & Pertinent Problem List: The primary encounter diagnosis was Lumbar facet syndrome (Bilateral) (L>R). Diagnoses of Spondylosis without myelopathy or radiculopathy, lumbosacral region, Chronic knee pain s/p total knee replacement (TKR) (Right), Neurogenic pain, Chronic pain syndrome, and Long term current use of opiate analgesic were also pertinent to this visit.  Status Diagnosis  Controlled Controlled Persistent 1. 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 Spondylosis without myelopathy or radiculopathy, lumbosacral region   3. Chronic knee pain s/p total knee replacement (TKR) (Right)   4. Neurogenic pain   5. Chronic pain syndrome   6. Long term current use of opiate analgesic     Problems updated and reviewed during this visit: No problems updated. Plan of Care  Pharmacotherapy (Medications Ordered): Meds ordered this encounter  Medications  . DISCONTD: gabapentin (NEURONTIN) 400 MG capsule    Sig: Take 1 capsule (400 mg total) by mouth at bedtime.    Dispense:  90 capsule    Refill:  2    Do not place medication on "Automatic Refill". Fill one day early if pharmacy is closed on scheduled refill date.    Order Specific Question:   Supervising Provider    Answer:   Milinda Pointer [phone]  . orphenadrine (NORFLEX) 100 MG tablet    Sig: Take 1 tablet (100 mg total) by mouth 2 (two) times daily.    Dispense:  60 tablet    Refill:  2    Do not place this medication, or any other prescription from our practice, on "Automatic Refill". Patient may have prescription filled one day early if pharmacy is closed on scheduled refill date.    Order Specific Question:   Supervising Provider    Answer:   Milinda Pointer [phone]  . HYDROcodone-acetaminophen  (NORCO/VICODIN) 5-325 MG tablet    Sig: Take 1 tablet by mouth 3 (three) times daily.    Dispense:  90 tablet    Refill:  0    Order Specific Question:   Supervising Provider    Answer:   Milinda Pointer [phone]  . HYDROcodone-acetaminophen (NORCO/VICODIN) 5-325 MG tablet    Sig: Take 1 tablet by mouth every 8 (eight) hours.    Dispense:  90 tablet  Refill:  0    Do not place this medication, or any other prescription from our practice, on "Automatic Refill". Patient may have prescription filled one day early if pharmacy is closed on scheduled refill date. Do not fill until: 02/07/2018 To last until:03/09/2018    Order Specific Question:   Supervising Provider    Answer:   Milinda Pointer [phone]  . HYDROcodone-acetaminophen (NORCO/VICODIN) 5-325 MG tablet    Sig: Take 1 tablet by mouth 3 (three) times daily.    Dispense:  90 tablet    Refill:  0    Do not place this medication, or any other prescription from our practice, on "Automatic Refill". Patient may have prescription filled one day early if pharmacy is closed on scheduled refill date. Do not fill until: 01/08/2018 To last until:02/07/2018    Order Specific Question:   Supervising Provider    Answer:   Milinda Pointer [phone]  . pregabalin (LYRICA) 50 MG capsule    Sig: Take 1 capsule (50 mg total) by mouth 2 (two) times daily.    Dispense:  60 capsule    Refill:  2    Do not place this medication, or any other prescription from our practice, on "Automatic Refill". Patient may have prescription filled one day early if pharmacy is closed on scheduled refill date.    Order Specific Question:   Supervising Provider    Answer:   Milinda Pointer [phone]   New Prescriptions   PREGABALIN (LYRICA) 50 MG CAPSULE    Take 1 capsule (50 mg total) by mouth 2 (two) times daily.   Medications administered today: [name] had no medications administered during this visit. Lab-work, procedure(s), and/or  referral(s): Orders Placed This Encounter  Procedures  . ToxASSURE Select 13 (MW), Urine   Imaging and/or referral(s): None  Interventional management options: Planned, scheduled, and/or pending: Not at this time.    Considering: Diagnostic right-sided intra-articular knee joint injection Possible series of 5 right-sided intra-articular Hyalgan knee injections Diagnostic right-sided genicular nerve block Possible right-sided genicular nerve RFA Diagnostic left cervical epidural steroid injection Diagnostic bilateral cervical facet blocks Diagnostic bilateral intra-articular shoulder joint injection Diagnostic bilateral suprascapular nerve block Possible bilateral suprascapular nerve RFA Diagnostic bilateral lumbar facet block Possible bilateral lumbar facet RFA   Palliative PRN treatment(s): None at this time     Provider-requested follow-up: Return in about 3 months (around 03/11/2018) for MedMgmt with Me [name]).  Future Appointments  Date Time Provider [place]  03/09/2018 11:00 AM Vevelyn Francois, NP [facility] None   Primary Care Physician: Wenda Low, MD Location: [facility] Note by: Vevelyn Francois NP Date: 12/09/2017; Time: 2:13 PM  Pain Score Disclaimer: We use the NRS-11 scale. This is a self-reported, subjective measurement of pain severity with only modest accuracy. It is used primarily to identify changes within a particular patient. It must be understood that outpatient pain scales are significantly less accurate that those used for research, where they can be applied under ideal controlled circumstances with minimal exposure to variables. In reality, the score is likely to be a combination of pain intensity and pain affect, where pain affect describes the degree of emotional arousal or changes in action readiness caused by the sensory experience of pain. Factors such as social and work  situation, setting, emotional state, anxiety levels, expectation, and prior pain experience may influence pain perception and show large inter-individual differences that may also be affected by time variables.  Patient  instructions provided during this appointment: Patient Instructions   ____________________________________________________________________________________________  Medication Rules  Applies to: All patients receiving prescriptions (written or electronic).  Pharmacy of record: Pharmacy where electronic prescriptions will be sent. If written prescriptions are taken to a different pharmacy, please inform the nursing staff. The pharmacy listed in the electronic medical record should be the one where you would like electronic prescriptions to be sent.  Prescription refills: Only during scheduled appointments. Applies to both, written and electronic prescriptions.  NOTE: The following applies primarily to controlled substances (Opioid* Pain Medications).   Patient's responsibilities: 1. Pain Pills: Bring all pain pills to every appointment (except for procedure appointments). 2. Pill Bottles: Bring pills in original pharmacy bottle. Always bring newest bottle. Bring bottle, even if empty. 3. Medication refills: You are responsible for knowing and keeping track of what medications you need refilled. The day before your appointment, write a list of all prescriptions that need to be refilled. Bring that list to your appointment and give it to the admitting nurse. Prescriptions will be written only during appointments. If you forget a medication, it will not be "Called in", "Faxed", or "electronically sent". You will need to get another appointment to get these prescribed. 4. Prescription Accuracy: You are responsible for carefully inspecting your prescriptions before leaving our office. Have the discharge nurse carefully go over each prescription with you, before taking them home. Make  sure that your name is accurately spelled, that your address is correct. Check the name and dose of your medication to make sure it is accurate. Check the number of pills, and the written instructions to make sure they are clear and accurate. Make sure that you are given enough medication to last until your next medication refill appointment. 5. Taking Medication: Take medication as prescribed. Never take more pills than instructed. Never take medication more frequently than prescribed. Taking less pills or less frequently is permitted and encouraged, when it comes to controlled substances (written prescriptions).  6. Inform other Doctors: Always inform, all of your healthcare providers, of all the medications you take. 7. Pain Medication from other Providers: You are not allowed to accept any additional pain medication from any other Doctor or Healthcare provider. There are two exceptions to this rule. (see below) In the event that you require additional pain medication, you are responsible for notifying us, as stated below. 8. Medication Agreement: You are responsible for carefully reading and following our Medication Agreement. This must be signed before receiving any prescriptions from our practice. Safely store a copy of your signed Agreement. Violations to the Agreement will result in no further prescriptions. (Additional copies of our Medication Agreement are available upon request.) 9. Laws, Rules, & Regulations: All patients are expected to follow all Federal and [employer], Rules, [employer]. Ignorance of the Laws does not constitute a valid excuse. The use of any illegal substances is prohibited. 10. Adopted CDC guidelines & recommendations: Target dosing levels will be at or below 60 MME/day. Use of benzodiazepines** is not recommended.  Exceptions: There are only two exceptions to the rule of not receiving pain medications from other Healthcare Providers. 1. Exception #1  (Emergencies): In the event of an emergency (i.e.: accident requiring emergency care), you are allowed to receive additional pain medication. However, you are responsible for: As soon as you are able, call our office [phone], at any time of the day or night, and leave a message stating your name, the date and nature of the  emergency, and the name and dose of the medication prescribed. In the event that your call is answered by a member of our staff, make sure to document and save the date, time, and the name of the person that took your information.  2. Exception #2 (Planned Surgery): In the event that you are scheduled by another doctor or dentist to have any type of surgery or procedure, you are allowed (for a period no longer than 30 days), to receive additional pain medication, for the acute post-op pain. However, in this case, you are responsible for picking up a copy of our "Post-op Pain Management for Surgeons" handout, and giving it to your surgeon or dentist. This document is available at our office, and does not require an appointment to obtain it. Simply go to our office during business hours (Monday-Thursday from 8:00 AM to 4:00 PM) (Friday 8:00 AM to 12:00 Noon) or if you have a scheduled appointment with [place], prior to your surgery, and ask for it by name. In addition, you will need to provide [place] with your name, name of your surgeon, type of surgery, and date of procedure or surgery.  *Opioid medications include: morphine, codeine, oxycodone, oxymorphone, hydrocodone, hydromorphone, meperidine, tramadol, tapentadol, buprenorphine, fentanyl, methadone. **Benzodiazepine medications include: diazepam (Valium), alprazolam (Xanax), clonazepam (Klonopine), lorazepam (Ativan), clorazepate (Tranxene), chlordiazepoxide (Librium), estazolam (Prosom), oxazepam (Serax), temazepam (Restoril), triazolam (Halcion) (Last updated:  07/24/2017) ____________________________________________________________________________________________    BMI Assessment: Estimated body mass index is 28.46 kg/m as calculated from the following:   Height as of this encounter: '5\' 5"'  (1.651 m).   Weight as of this encounter: 171 lb (77.6 kg).  BMI interpretation table: BMI level Category Range association with higher incidence of chronic pain  <18 kg/m2 Underweight   18.5-24.9 kg/m2 Ideal body weight   25-29.9 kg/m2 Overweight Increased incidence by 20%  30-34.9 kg/m2 Obese (Class I) Increased incidence by 68%  35-39.9 kg/m2 Severe obesity (Class II) Increased incidence by 136%  >40 kg/m2 Extreme obesity (Class III) Increased incidence by 254%   Patient's current BMI Ideal Body weight  Body mass index is 28.46 kg/m. Ideal body weight: 57 kg (125 lb 10.6 oz) Adjusted ideal body weight: 65.2 kg (143 lb 12.8 oz)   BMI Readings from Last 4 Encounters:  12/09/17 28.46 kg/m  10/27/17 30.55 kg/m  10/22/17 31.93 kg/m  09/19/17 31.93 kg/m   Wt Readings from Last 4 Encounters:  12/09/17 171 lb (77.6 kg)  10/26/17 178 lb (80.7 kg)  10/22/17 186 lb (84.4 kg)  09/19/17 186 lb (84.4 kg)

## 2017-12-09 NOTE — Progress Notes (Signed)
Nursing Pain Medication Assessment:  Safety precautions to be maintained throughout the outpatient stay will include: orient to surroundings, keep bed in low position, maintain call bell within reach at all times, provide assistance with transfer out of bed and ambulation.  Medication Inspection Compliance: Pill count conducted under aseptic conditions, in front of the patient. Neither the pills nor the bottle was removed from the patient's sight at any time. Once count was completed pills were immediately returned to the patient in their original bottle.  Medication: Hydrocodone/APAP Pill/Patch Count: 0 of 90 pills remain Pill/Patch Appearance: Markings consistent with prescribed medication Bottle Appearance: Standard pharmacy container. Clearly labeled. Filled Date: 05 / 22 / 2019 Last Medication intake:  Ran out of medicine more than 48 hours ago

## 2017-12-09 NOTE — Patient Instructions (Addendum)
____________________________________________________________________________________________  Medication Rules  Applies to: All patients receiving prescriptions (written or electronic).  Pharmacy of record: Pharmacy where electronic prescriptions will be sent. If written prescriptions are taken to a different pharmacy, please inform the nursing staff. The pharmacy listed in the electronic medical record should be the one where you would like electronic prescriptions to be sent.  Prescription refills: Only during scheduled appointments. Applies to both, written and electronic prescriptions.  NOTE: The following applies primarily to controlled substances (Opioid* Pain Medications).   Patient's responsibilities: 1. Pain Pills: Bring all pain pills to every appointment (except for procedure appointments). 2. Pill Bottles: Bring pills in original pharmacy bottle. Always bring newest bottle. Bring bottle, even if empty. 3. Medication refills: You are responsible for knowing and keeping track of what medications you need refilled. The day before your appointment, write a list of all prescriptions that need to be refilled. Bring that list to your appointment and give it to the admitting nurse. Prescriptions will be written only during appointments. If you forget a medication, it will not be "Called in", "Faxed", or "electronically sent". You will need to get another appointment to get these prescribed. 4. Prescription Accuracy: You are responsible for carefully inspecting your prescriptions before leaving our office. Have the discharge nurse carefully go over each prescription with you, before taking them home. Make sure that your name is accurately spelled, that your address is correct. Check the name and dose of your medication to make sure it is accurate. Check the number of pills, and the written instructions to make sure they are clear and accurate. Make sure that you are given enough medication to last  until your next medication refill appointment. 5. Taking Medication: Take medication as prescribed. Never take more pills than instructed. Never take medication more frequently than prescribed. Taking less pills or less frequently is permitted and encouraged, when it comes to controlled substances (written prescriptions).  6. Inform other Doctors: Always inform, all of your healthcare providers, of all the medications you take. 7. Pain Medication from other Providers: You are not allowed to accept any additional pain medication from any other Doctor or Healthcare provider. There are two exceptions to this rule. (see below) In the event that you require additional pain medication, you are responsible for notifying us, as stated below. 8. Medication Agreement: You are responsible for carefully reading and following our Medication Agreement. This must be signed before receiving any prescriptions from our practice. Safely store a copy of your signed Agreement. Violations to the Agreement will result in no further prescriptions. (Additional copies of our Medication Agreement are available upon request.) 9. Laws, Rules, & Regulations: All patients are expected to follow all Federal and State Laws, Statutes, Rules, & Regulations. Ignorance of the Laws does not constitute a valid excuse. The use of any illegal substances is prohibited. 10. Adopted CDC guidelines & recommendations: Target dosing levels will be at or below 60 MME/day. Use of benzodiazepines** is not recommended.  Exceptions: There are only two exceptions to the rule of not receiving pain medications from other Healthcare Providers. 1. Exception #1 (Emergencies): In the event of an emergency (i.e.: accident requiring emergency care), you are allowed to receive additional pain medication. However, you are responsible for: As soon as you are able, call our office [phone], at any time of the day or night, and leave a message stating your name, the  date and nature of the emergency, and the name and dose of the medication   prescribed. In the event that your call is answered by a member of our staff, make sure to document and save the date, time, and the name of the person that took your information.  2. Exception #2 (Planned Surgery): In the event that you are scheduled by another doctor or dentist to have any type of surgery or procedure, you are allowed (for a period no longer than 30 days), to receive additional pain medication, for the acute post-op pain. However, in this case, you are responsible for picking up a copy of our "Post-op Pain Management for Surgeons" handout, and giving it to your surgeon or dentist. This document is available at our office, and does not require an appointment to obtain it. Simply go to our office during business hours (Monday-Thursday from 8:00 AM to 4:00 PM) (Friday 8:00 AM to 12:00 Noon) or if you have a scheduled appointment with [place], prior to your surgery, and ask for it by name. In addition, you will need to provide [place] with your name, name of your surgeon, type of surgery, and date of procedure or surgery.  *Opioid medications include: morphine, codeine, oxycodone, oxymorphone, hydrocodone, hydromorphone, meperidine, tramadol, tapentadol, buprenorphine, fentanyl, methadone. **Benzodiazepine medications include: diazepam (Valium), alprazolam (Xanax), clonazepam (Klonopine), lorazepam (Ativan), clorazepate (Tranxene), chlordiazepoxide (Librium), estazolam (Prosom), oxazepam (Serax), temazepam (Restoril), triazolam (Halcion) (Last updated: 07/24/2017) ____________________________________________________________________________________________    BMI Assessment: Estimated body mass index is 28.46 kg/m as calculated from the following:   Height as of this encounter: 5\' 5"  (1.651 m).   Weight as of this encounter: 171 lb (77.6 kg).  BMI interpretation table: BMI level Category Range association with higher  incidence of chronic pain  <18 kg/m2 Underweight   18.5-24.9 kg/m2 Ideal body weight   25-29.9 kg/m2 Overweight Increased incidence by 20%  30-34.9 kg/m2 Obese (Class I) Increased incidence by 68%  35-39.9 kg/m2 Severe obesity (Class II) Increased incidence by 136%  >40 kg/m2 Extreme obesity (Class III) Increased incidence by 254%   Patient's current BMI Ideal Body weight  Body mass index is 28.46 kg/m. Ideal body weight: 57 kg (125 lb 10.6 oz) Adjusted ideal body weight: 65.2 kg (143 lb 12.8 oz)   BMI Readings from Last 4 Encounters:  12/09/17 28.46 kg/m  10/27/17 30.55 kg/m  10/22/17 31.93 kg/m  09/19/17 31.93 kg/m   Wt Readings from Last 4 Encounters:  12/09/17 171 lb (77.6 kg)  10/26/17 178 lb (80.7 kg)  10/22/17 186 lb (84.4 kg)  09/19/17 186 lb (84.4 kg)

## 2017-12-11 ENCOUNTER — Telehealth: Payer: Self-pay | Admitting: Nurse Practitioner

## 2017-12-11 NOTE — Telephone Encounter (Signed)
Called patient's daughter and let her know. She will call us back if they wish to proceed with the femoral nerve block.

## 2017-12-11 NOTE — Telephone Encounter (Signed)
Spoke with daughter. States at appointment with Dr. Jenness Corner they were told that patient's pain was coming from her hip and not from her knee. Daughter states the Hydrocodone 5's are not touching her mother's pain and that when patient was in the hospital the Oxy 5's seemed to work a lot better. Crystal please advise. Patient was here last on 12/09/17

## 2017-12-11 NOTE — Telephone Encounter (Signed)
Scheduled patient for femoral nerve block.

## 2017-12-11 NOTE — Telephone Encounter (Signed)
This was discussed at her last visit.  She can have a femoral nerve block to see if this will help the pain around her hip  I will be glad to place this order.

## 2017-12-11 NOTE — Telephone Encounter (Signed)
Patients daughter [name] lvmail Wed at 3:48 stating patient is in extreme pain and crying and screaming in pain, Vicodin is not helping at all. They spoke with Dr. Jenness Corner and he said knee is in great shape, her pain is coming from hip surgery.

## 2017-12-13 LAB — TOXASSURE SELECT 13 (MW), URINE

## 2017-12-18 ENCOUNTER — Encounter: Payer: Self-pay | Admitting: Pain Medicine

## 2017-12-18 ENCOUNTER — Ambulatory Visit
Admission: RE | Admit: 2017-12-18 | Discharge: 2017-12-18 | Disposition: A | Discharge status: Home / Self Care | Payer: Medicare Other | Source: Ambulatory Visit | Attending: Pain Medicine | Admitting: Pain Medicine

## 2017-12-18 ENCOUNTER — Other Ambulatory Visit: Payer: Self-pay

## 2017-12-18 ENCOUNTER — Ambulatory Visit (HOSPITAL_BASED_OUTPATIENT_CLINIC_OR_DEPARTMENT_OTHER): Payer: Medicare Other | Admitting: Pain Medicine

## 2017-12-18 VITALS — BP 145/91 | HR 80 | Temp 98.9°F | Resp 18 | Ht 63.0 in | Wt 178.0 lb

## 2017-12-18 DIAGNOSIS — Z885 Allergy status to narcotic agent status: Secondary | ICD-10-CM | POA: Insufficient documentation

## 2017-12-18 DIAGNOSIS — G8929 Other chronic pain: Secondary | ICD-10-CM | POA: Insufficient documentation

## 2017-12-18 DIAGNOSIS — Z96641 Presence of right artificial hip joint: Secondary | ICD-10-CM | POA: Insufficient documentation

## 2017-12-18 DIAGNOSIS — N816 Rectocele: Secondary | ICD-10-CM | POA: Diagnosis not present

## 2017-12-18 DIAGNOSIS — Z880 Allergy status to penicillin: Secondary | ICD-10-CM | POA: Insufficient documentation

## 2017-12-18 DIAGNOSIS — M25551 Pain in right hip: Secondary | ICD-10-CM | POA: Diagnosis not present

## 2017-12-18 DIAGNOSIS — M7631 Iliotibial band syndrome, right leg: Secondary | ICD-10-CM | POA: Insufficient documentation

## 2017-12-18 DIAGNOSIS — Z888 Allergy status to other drugs, medicaments and biological substances status: Secondary | ICD-10-CM | POA: Diagnosis not present

## 2017-12-18 DIAGNOSIS — Z882 Allergy status to sulfonamides status: Secondary | ICD-10-CM | POA: Insufficient documentation

## 2017-12-18 DIAGNOSIS — Z881 Allergy status to other antibiotic agents status: Secondary | ICD-10-CM | POA: Diagnosis not present

## 2017-12-18 DIAGNOSIS — Z79899 Other long term (current) drug therapy: Secondary | ICD-10-CM | POA: Insufficient documentation

## 2017-12-18 MED ORDER — MIDAZOLAM HCL 5 MG/5ML IJ SOLN
1.0000 mg | INTRAMUSCULAR | Status: DC | PRN
  Administered 2017-12-18: 2 mg via INTRAVENOUS
  Filled 2017-12-18: qty 5

## 2017-12-18 MED ORDER — LACTATED RINGERS IV SOLN
1000.0000 mL | Freq: Once | INTRAVENOUS | Status: AC
  Administered 2017-12-18: 1000 mL via INTRAVENOUS

## 2017-12-18 MED ORDER — FENTANYL CITRATE (PF) 100 MCG/2ML IJ SOLN
25.0000 ug | INTRAMUSCULAR | Status: DC | PRN
  Administered 2017-12-18: 100 ug via INTRAVENOUS
  Filled 2017-12-18: qty 2

## 2017-12-18 MED ORDER — TRIAMCINOLONE ACETONIDE 40 MG/ML IJ SUSP
40.0000 mg | Freq: Once | INTRAMUSCULAR | Status: AC
  Administered 2017-12-18: 40 mg via INTRA_ARTICULAR
  Filled 2017-12-18: qty 1

## 2017-12-18 MED ORDER — LIDOCAINE HCL 2 % IJ SOLN
20.0000 mL | Freq: Once | INTRAMUSCULAR | Status: AC
  Administered 2017-12-18: 400 mg
  Filled 2017-12-18: qty 40

## 2017-12-18 MED ORDER — ROPIVACAINE HCL 2 MG/ML IJ SOLN
10.0000 mL | Freq: Once | INTRAMUSCULAR | Status: AC
  Administered 2017-12-18: 10 mL via EPIDURAL
  Filled 2017-12-18: qty 10

## 2017-12-18 MED ORDER — DEXAMETHASONE SODIUM PHOSPHATE 10 MG/ML IJ SOLN
10.0000 mg | Freq: Once | INTRAMUSCULAR | Status: DC
  Filled 2017-12-18: qty 1

## 2017-12-18 NOTE — Progress Notes (Signed)
Safety precautions to be maintained throughout the outpatient stay will include: orient to surroundings, keep bed in low position, maintain call bell within reach at all times, provide assistance with transfer out of bed and ambulation.  

## 2017-12-18 NOTE — Progress Notes (Signed)
Patient's Name: [name]  MRN: [id number]  Referring Provider: Wenda Low, MD  DOB: 1940/12/09  PCP: Wenda Low, MD  DOS: 12/18/2017  Note by: Gaspar Cola, MD  Service setting: Ambulatory outpatient  Specialty: Interventional Pain Management  Patient type: Established  Location: [facility] (AMB) Pain Management Facility  Visit type: Interventional Procedure   Primary Reason for Visit: Interventional Pain Management Treatment. CC: Procedure (Femoral nerve block)  Procedure:          Anesthesia, Analgesia, Anxiolysis:  Type: Iliotibial band injection. Ligament/Tendon sheath (20550) Injection. Purpose: Diagnostic Level: Knee Laterality: Right-sided Position: Supine Target Area: Iliotibial band Region: Lateral distal aspect of the thigh Approach: Percutanous  Type: Moderate (Conscious) Sedation combined with Local Anesthesia Indication(s): Analgesia and Anxiety Local Anesthetic: Lidocaine 1-2% Route: Intravenous (IV) IV Access: Secured Sedation: Meaningful verbal contact was maintained at all times during the procedure    Indications: 1. Chronic hip pain (Right)   2. It band syndrome, right    Pain Score: Pre-procedure: 5 /10 Post-procedure: 0-No pain/10  Pre-op Assessment:  [name] is a 77 y.o. (year old), female patient, seen today for interventional treatment. 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 [name] has a current medication list which includes the following prescription(s): albuterol, albuterol, alendronate, alprazolam, amlodipine, calcium-phosphorus-vitamin d, citalopram, docusate  sodium, esomeprazole, iron, ginkgo biloba, hydrocodone-acetaminophen, hydrocodone-acetaminophen, hydrocodone-acetaminophen, lactose free nutrition, linaclotide, lovastatin, lyrica, meloxicam, menthol (topical analgesic), multiple vitamins-minerals, ondansetron, orphenadrine, polyethylene glycol, pregabalin, probiotic product, senna, trazodone, and naloxegol oxalate, and the following Facility-Administered Medications: dexamethasone, fentanyl, and midazolam. Her primarily concern today is the Procedure (Femoral nerve block)  Physical exam conducted prior to the procedure was positive for exquisite tenderness to palpation over the distal end of the iliotibial band lateral to the knee. The band easily be palpated and it was significantly tender to palpation. The patient's daughter indicated that stretches of the iliotibial band reproduced the pain. This apparently started after her most recent surgery when she started doing the postoperative physical therapy and recovery exercises. Among these there was stretches of the iliotibial band and since doing those this pain has continued to worsen.  Initial Vital Signs:  Pulse/HCG Rate: 72  Temp: 98.2 F (36.8 C) Resp: 18 BP: (!) 148/85 SpO2: 97 %  BMI: Estimated body mass index is 31.53 kg/m as calculated from the following:   Height as of this encounter: 5\' 3"  (1.6 m).   Weight as of this encounter: 178 lb (80.7 kg).  Risk Assessment: Allergies: Reviewed. She is allergic to ampicillin; ampicillin-sulbactam sodium; ambien [zolpidem tartrate]; flexeril [cyclobenzaprine]; tape; toradol [ketorolac tromethamine]; sulfamethoxazole-trimethoprim; cephalexin; percocet [oxycodone-acetaminophen]; and tramadol.  Allergy Precautions: None required Coagulopathies: Reviewed. None identified.  Blood-thinner therapy: None at this time Active Infection(s): Reviewed. None identified. [name] is afebrile  Site Confirmation: [name] was asked to confirm the  procedure and laterality before marking the site Procedure checklist: Completed Consent: Before the procedure and under the influence of no sedative(s), amnesic(s), or anxiolytics, the patient was informed of the treatment options, risks and possible complications. To fulfill our ethical and legal obligations, as recommended by the American Medical Association's Code of Ethics, I have informed the patient of my clinical impression; the nature and purpose of the  treatment or procedure; the risks, benefits, and possible complications of the intervention; the alternatives, including doing nothing; the risk(s) and benefit(s) of the alternative treatment(s) or procedure(s); and the risk(s) and benefit(s) of doing nothing. The patient was provided information about the general risks and possible complications associated with the procedure. These may include, but are not limited to: failure to achieve desired goals, infection, bleeding, organ or nerve damage, allergic reactions, paralysis, and death. In addition, the patient was informed of those risks and complications associated to the procedure, such as failure to decrease pain; infection; bleeding; organ or nerve damage with subsequent damage to sensory, motor, and/or autonomic systems, resulting in permanent pain, numbness, and/or weakness of one or several areas of the body; allergic reactions; (i.e.: anaphylactic reaction); and/or death. Furthermore, the patient was informed of those risks and complications associated with the medications. These include, but are not limited to: allergic reactions (i.e.: anaphylactic or anaphylactoid reaction(s)); adrenal axis suppression; blood sugar elevation that in diabetics may result in ketoacidosis or comma; water retention that in patients with history of congestive heart failure may result in shortness of breath, pulmonary edema, and decompensation with resultant heart failure; weight gain; swelling or edema;  medication-induced neural toxicity; particulate matter embolism and blood vessel occlusion with resultant organ, and/or nervous system infarction; and/or aseptic necrosis of one or more joints. Finally, the patient was informed that Medicine is not an exact science; therefore, there is also the possibility of unforeseen or unpredictable risks and/or possible complications that may result in a catastrophic outcome. The patient indicated having understood very clearly. We have given the patient no guarantees and we have made no promises. Enough time was given to the patient to ask questions, all of which were answered to the patient's satisfaction. Ms. Pais has indicated that she wanted to continue with the procedure. Attestation: I, the ordering provider, attest that I have discussed with the patient the benefits, risks, side-effects, alternatives, likelihood of achieving goals, and potential problems during recovery for the procedure that I have provided informed consent. Date  Time: 12/18/2017  1:29 PM  Pre-Procedure Preparation:  Monitoring: As per clinic protocol. Respiration, ETCO2, SpO2, BP, heart rate and rhythm monitor placed and checked for adequate function Safety Precautions: Patient was assessed for positional comfort and pressure points before starting the procedure. Time-out: I initiated and conducted the "Time-out" before starting the procedure, as per protocol. The patient was asked to participate by confirming the accuracy of the "Time Out" information. Verification of the correct person, site, and procedure were performed and confirmed by me, the nursing staff, and the patient. "Time-out" conducted as per Joint Commission's Universal Protocol (UP.01.01.01). Time: 1500  Description of Procedure:          Area Prepped: Anterolateral distal thigh and knee area Prepping solution: ChloraPrep (2% chlorhexidine gluconate and 70% isopropyl alcohol) Safety Precautions: Aspiration looking for  blood return was conducted prior to all injections. At no point did we inject any substances, as a needle was being advanced. No attempts were made at seeking any paresthesias. Safe injection practices and needle disposal techniques used. Medications properly checked for expiration dates. SDV (single dose vial) medications used. Description of the Procedure: Protocol guidelines were followed. The patient was placed in position. The target area was identified and prepped in the usual manner. Skin & deeper tissues infiltrated with local anesthetic. Appropriate time provided for local anesthetics to take effect. The procedure needle was slowly advanced to target area. Proper needle placement secured.  Negative aspiration confirmed. The area of maximum tenderness over the distal iliotibial band was infiltrated. The solution was injected in intermittent fashion, asking for systemic symptoms every 0.5cc. Needle(s) removed and area cleaned, making sure to leave some prepping solution back to take advantage of its long term bactericidal properties.  Vitals:   12/18/17 1505 12/18/17 1515 12/18/17 1525 12/18/17 1535  BP: (!) 141/93 134/79 139/73 (!) 145/91  Pulse: 82 79 80 80  Resp: 16 19 18 18   Temp:  98.4 F (36.9 C)  98.9 F (37.2 C)  TempSrc:  Temporal  Temporal  SpO2: 97% 97% 95% 95%  Weight:      Height:        Start Time: 1500 hrs. End Time: 1505 hrs. Materials:  Needle(s) Type: Spinal needle(s) Gauge: 22G Length: 3.5-in Medication(s): Please see orders for medications and dosing details.  Imaging Guidance:          Type of Imaging Technique: Fluoroscopy Guidance (Non-spinal) Indication(s): Assistance in needle guidance and placement for procedures requiring needle placement in or near specific anatomical locations not easily accessible without such assistance. Exposure Time: Please see nurses notes. Contrast: None used. Fluoroscopic Guidance: I was personally present during the use of  fluoroscopy. "Tunnel Vision Technique" used to obtain the best possible view of the target area. Parallax error corrected before commencing the procedure. "Direction-depth-direction" technique used to introduce the needle under continuous pulsed fluoroscopy. Once target was reached, antero-posterior, oblique, and lateral fluoroscopic projection used confirm needle placement in all planes. Images permanently stored in EMR. Ultrasound Guidance: N/A Interpretation: No contrast injected. I personally interpreted the imaging intraoperatively. Adequate needle placement confirmed in multiple planes. Permanent images saved into the patient's record.  Post-operative Assessment:  Post-procedure Vital Signs:  Pulse/HCG Rate: 80  Temp: 98.9 F (37.2 C) Resp: 18 BP: (!) 145/91 SpO2: 95 %  EBL: None  Complications: No immediate post-treatment complications observed by team, or reported by patient.  Note: The patient tolerated the entire procedure well. A repeat set of vitals were taken after the procedure and the patient was kept under observation following institutional policy, for this type of procedure. Post-procedural neurological assessment was performed, showing return to baseline, prior to discharge. The patient was provided with post-procedure discharge instructions, including a section on how to identify potential problems. Should any problems arise concerning this procedure, the patient was given instructions to immediately contact us, at any time, without hesitation. In any case, we plan to contact the patient by telephone for a follow-up status report regarding this interventional procedure.  Comments:  No additional relevant information.  Plan of Care   Imaging Orders     DG C-Arm 1-60 Min-No Report  Procedure Orders     Injection tendon or ligament  Medications ordered for procedure: Meds ordered this encounter  Medications  . lidocaine (XYLOCAINE) 2 % (with pres) injection 400 mg  .  midazolam (VERSED) 5 MG/5ML injection 1-2 mg    Make sure Flumazenil is available in the pyxis when using this medication. If oversedation occurs, administer 0.2 mg IV over 15 sec. If after 45 sec no response, administer 0.2 mg again over 1 min; may repeat at 1 min intervals; not to exceed 4 doses (1 mg)  . fentaNYL (SUBLIMAZE) injection 25-50 mcg    Make sure Narcan is available in the pyxis when using this medication. In the event of respiratory depression (RR< 8/min): Titrate NARCAN (naloxone) in increments of 0.1 to 0.2 mg IV at 2-3 minute  intervals, until desired degree of reversal.  . lactated ringers infusion 1,000 mL  . dexamethasone (DECADRON) injection 10 mg  . triamcinolone acetonide (KENALOG-40) injection 40 mg  . ropivacaine (PF) 2 mg/mL (0.2%) (NAROPIN) injection 10 mL   Medications administered: We administered lidocaine, midazolam, fentaNYL, lactated ringers, triamcinolone acetonide, and ropivacaine (PF) 2 mg/mL (0.2%).  See the medical record for exact dosing, route, and time of administration.  New Prescriptions   No medications on file   Disposition: Discharge home  Discharge Date & Time: 12/18/2017; 1538 hrs.   Physician-requested Follow-up: Return for post-procedure eval (2 wks), w/ Dr. Dossie Arbour.  Future Appointments  Date Time Provider [place]  12/29/2017  2:00 PM Milinda Pointer, MD [facility] None  03/09/2018 11:00 AM Vevelyn Francois, NP [facility] None   Primary Care Physician: Wenda Low, MD Location: [facility] Note by: Gaspar Cola, MD Date: 12/18/2017; Time: 4:17 PM  Disclaimer:  Medicine is not an Chief Strategy Officer. The only guarantee in medicine is that nothing is guaranteed. It is important to note that the decision to proceed with this intervention was based on the information collected from the patient. The Data and conclusions were drawn from the patient's questionnaire, the interview, and the physical  examination. Because the information was provided in large part by the patient, it cannot be guaranteed that it has not been purposely or unconsciously manipulated. Every effort has been made to obtain as much relevant data as possible for this evaluation. It is important to note that the conclusions that lead to this procedure are derived in large part from the available data. Always take into account that the treatment will also be dependent on availability of resources and existing treatment guidelines, considered by other Pain Management Practitioners as being common knowledge and practice, at the time of the intervention. For Medico-Legal purposes, it is also important to point out that variation in procedural techniques and pharmacological choices are the acceptable norm. The indications, contraindications, technique, and results of the above procedure should only be interpreted and judged by a Board-Certified Interventional Pain Specialist with extensive familiarity and expertise in the same exact procedure and technique.

## 2017-12-18 NOTE — Patient Instructions (Signed)

## 2017-12-19 ENCOUNTER — Telehealth: Payer: Self-pay

## 2017-12-19 NOTE — Telephone Encounter (Signed)
Patient states her knee still hurts. Encouraged her to use heat or ice . Told patient that she will have to wait 3-10 days for the steroids to work to notice a difference, Patient will call as needed.

## 2017-12-22 ENCOUNTER — Telehealth: Payer: Self-pay | Admitting: *Deleted

## 2017-12-22 NOTE — Telephone Encounter (Signed)
Patients daughter states that the patient is having pain in her knee.  Patient had procedure on 12-18-17 on her knee.  Patient states that her knee pain is the same as it was before the procedure.   Denies fever, redness or swelling in knee. States that the pain was gone the day opf the procedure but returned the next day.  Informed patient that we needed to wait for the steroid to work which could take up to 10 days.  Informed to call us for in a couple of days if the pain does not get any better or if there are any other questions or concerns.  Patient states understanding.

## 2017-12-24 ENCOUNTER — Telehealth: Payer: Self-pay

## 2017-12-24 NOTE — Telephone Encounter (Signed)
Dr. Dossie Arbour advised patient must wait 10 days to be re evaluated, as adequate time has not passed for the procedure to be effective. [name] notifie.

## 2017-12-24 NOTE — Telephone Encounter (Signed)
Returned as requested

## 2017-12-24 NOTE — Telephone Encounter (Signed)
Spoke with [name]. Patient having increased pain. No fever. Informed her that a time frame of 7-10 days is needed in order to experience effect of the injection. Wants her to be seen today, here or go to ED. Will see if [facility] has availability today.

## 2017-12-24 NOTE — Telephone Encounter (Signed)
Pt's daughter called and pt is having severe pain,Had procedure on 07/25, She called a couple day's ago and was instructed to give it a couple more days but it's worse now, considering going to the hospital. Please call and inform what to do.

## 2017-12-26 NOTE — Progress Notes (Signed)
Patient's Name: [name]  MRN: [id number]  Referring Provider: Wenda Low, MD  DOB: 1940-11-30  PCP: Wenda Low, MD  DOS: 12/29/2017  Note by: Gaspar Cola, MD  Service setting: Ambulatory outpatient  Specialty: Interventional Pain Management  Location: [facility] (AMB) Pain Management Facility    Patient type: Established   Primary Reason(s) for Visit: Encounter for post-procedure evaluation of chronic illness with mild to moderate exacerbation CC: Knee Pain (right)  HPI  [name] is a 77 y.o. year old, female patient, who comes today for a post-procedure evaluation. 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[place]); Hypertensive kidney disease, malignant; Chronic low back pain (Tertiary Area of Pain) (Bilateral) (L>R); CAFL (chronic airflow limitation) ([place]); Apoplectic vertigo; Duodenogastric reflux; History of stroke; Hoarseness; Benign essential HTN; Chronic knee pain (Primary Area of Pain) (Right); Depression, major, in partial remission ([place]); Major depression in remission ([place]); Involutional osteoporosis; Pharyngoesophageal dysphagia; 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); Hx of total knee replacement (Bilateral); Infection of the upper respiratory tract; Bed wetting; UTI (urinary tract infection); Long term current use of opiate analgesic; Long term prescription opiate use; Chronic pain syndrome; Chronic neck pain (Secondary Area of Pain) (Bilateral) (L>R); Chronic hip pain (Right); Rib pain; DDD (degenerative disc disease), cervical; Lumbar facet arthropathy (Bilateral);  Chronic myofascial pain; Lumbar facet syndrome (Bilateral) (L>R); Long term prescription benzodiazepine use; Opiate use; Disorder of skeletal system; Pharmacologic therapy; Problems influencing health status; Opioid-induced constipation (OIC); NSAID long-term use; DDD (degenerative disc disease), lumbar; Chronic knee pain (Bilateral) (R>L); Osteoarthritis; Chronic musculoskeletal pain; Neurogenic pain; Chronic knee pain s/p total knee replacement (TKR) (Right); CKD (chronic kidney disease) stage 3, GFR 30-59 ml/min ([place]); Chronic shoulder pain (Left); Non-traumatic compression fracture of T3 thoracic vertebra, sequela; High risk medication use; At high risk for falls; Chronic sacroiliac joint pain (Right); Spondylosis without myelopathy or radiculopathy, lumbosacral region; Other specified dorsopathies, sacral and sacrococcygeal region; Abnormal x-ray of pelvis; SBO (small bowel obstruction) ([place]); History of small bowel obstruction; Delayed union of closed fracture of hip, right; Primary osteoarthritis of hip; Ileus ([place]); It band syndrome, right; and History of right hip replacement on their problem list. Her primarily concern today is the Knee Pain (right)  Pain Assessment: Location: Right Knee Radiating: unsure Onset: More than a month ago Duration: Chronic pain Quality: Stabbing, Shooting Severity: 6 /10 (subjective, self-reported pain score)  Note: Reported level is inconsistent with clinical observations. Clinically the patient looks like a 2/10 A 2/10 is viewed as "Mild to Moderate" and described as noticeable and distracting. Impossible to hide from other people. More frequent flare-ups. Still possible to adapt and function close to normal. It can be very annoying and may have occasional stronger flare-ups. With discipline, patients may get used to it and adapt. Information on the proper use of the pain scale provided to the patient today. When using our objective Pain Scale, levels between 6 and  10/10 are said to belong in an emergency room, as it progressively worsens from a 6/10, described as severely limiting, requiring emergency care not usually available at an outpatient pain management facility. At a 6/10 level, communication becomes difficult and requires great effort. Assistance to reach the emergency department may be required. Facial flushing  and profuse sweating along with potentially dangerous increases in heart rate and blood pressure will be evident. Effect on ADL: "I cant walk" Timing: Constant Modifying factors: nothing BP: (!) 152/87  HR: (!) 59  [name] comes in today for post-procedure evaluation after the treatment done on 12/24/2017. The patient recently underwent a diagnostic right iliotibial band injection with local anesthetic and steroid. The pain that she was having over the iliotibial band is completely gone. However, she continues to have her usual right knee pain and right hip pain. Previously we had done a diagnostic right-sided genicular nerve block with excellent results. Because of this, we plan on doing a right-sided genicular nerve RFA under fluoroscopic guidance and IV sedation. Once we get this pain under control with the radiofrequency, then we will turn our attention towards the right hip. She is also having pain in that area after her implant and therefore we'll consider the possibility of doing a diagnostic, right sided, femoral nerve block and obturator nerve block (articular branches). Should she get good benefit, then we will consider radiofrequency ablation. Today she returned all of her hydrocodone prescriptions saying that it does not control her pain. She is still taking the meloxicam and the Lyrica 50 mg by mouth twice a day. She indicates having no side effects to the Lyrica but she has not noticed any significant benefit. We'll continue to titrate the Lyrica up. Today I will go to 75 mg by mouth twice a day. In addition, I will give her a  prescription for oxycodone IR 5 mg to be taken 1 tablet by mouth every 8 hours prn for pain. She was given a warning about combining any type of benzodiazepines with this medication and she indicated that she no longer has any benzodiazepines available and she is not taking any at this time.  Further details on both, my assessment(s), as well as the proposed treatment plan, please see below.  Post-Procedure Assessment  12/18/2017 Procedure: Diagnostic right iliotibial band injection #1 under fluoroscopic guidance and IV sedation  Pre-procedure pain score:  5/10 Post-procedure pain score: 0/10 (100% relief) Influential Factors: BMI: 30.79 kg/m Intra-procedural challenges: None observed.         Assessment challenges: None detected.              Reported side-effects: None.        Post-procedural adverse reactions or complications: None reported         Sedation: Sedation provided. When no sedatives are used, the analgesic levels obtained are directly associated to the effectiveness of the local anesthetics. However, when sedation is provided, the level of analgesia obtained during the initial 1 hour following the intervention, is believed to be the result of a combination of factors. These factors may include, but are not limited to: 1. The effectiveness of the local anesthetics used. 2. The effects of the analgesic(s) and/or anxiolytic(s) used. 3. The degree of discomfort experienced by the patient at the time of the procedure. 4. The patients ability and reliability in recalling and recording the events. 5. The presence and influence of possible secondary gains and/or psychosocial factors. Reported result: Relief experienced during the 1st hour after the procedure: 100 % (Ultra-Short Term Relief)            Interpretative annotation: Clinically appropriate result. Analgesia during this period is likely to be Local Anesthetic and/or IV Sedative (Analgesic/Anxiolytic) related.          Effects  of local anesthetic: The analgesic  effects attained during this period are directly associated to the localized infiltration of local anesthetics and therefore cary significant diagnostic value as to the etiological location, or anatomical origin, of the pain. Expected duration of relief is directly dependent on the pharmacodynamics of the local anesthetic used. Long-acting (4-6 hours) anesthetics used.  Reported result: Relief during the next 4 to 6 hour after the procedure: 50 % (Short-Term Relief)            Interpretative annotation: Clinically appropriate result. Analgesia during this period is likely to be Local Anesthetic-related.          Long-term benefit: Defined as the period of time past the expected duration of local anesthetics (1 hour for short-acting and 4-6 hours for long-acting). With the possible exception of prolonged sympathetic blockade from the local anesthetics, benefits during this period are typically attributed to, or associated with, other factors such as analgesic sensory neuropraxia, antiinflammatory effects, or beneficial biochemical changes provided by agents other than the local anesthetics.  Reported result: Extended relief following procedure: 0 % (Long-Term Relief)            Interpretative annotation: Clinically appropriate result. Good relief. No permanent benefit expected. Inflammation plays a part in the etiology to the pain.          Current benefits: Defined as reported results that persistent at this point in time.   Analgesia: 100 % Complete relief of the pain in the iliotibial band. However, she continues to have severe pain in the right knee where she had her replacement done. Function: Somewhat improved ROM: Somewhat improved Interpretative annotation: Complete relief. Therapeutic success. Effective therapeutic approach.          Interpretation: Results would suggest a successful diagnostic intervention.         [name] indicates having had an  unsuccessful trial of physical therapy, which she described as non-beneficial and painful.  Plan:  Her pain is now located over her right knee, as it was before. She has continued to have pain after her knee replacement. A right knee diagnostic genicular block #1, done at the very beginning of her treatment provided her with 100% relief of the knee pain for the duration of local anesthetic. This would suggest that radiofrequency ablation of the genicular nerves could provide her with longer lasting benefit in terms of her knee pain. "The patient has failed to respond to conservative therapies including over-the-counter medications, anti-inflammatories, muscle relaxants, membrane stabilizers, opioids, physical therapy modalities such as heat and ice, as well as more invasive techniques such as nerve blocks. Because [name] did attain more than 50% relief of the pain during a series of diagnostic blocks conducted in separate occasions, I believe it is medically necessary to proceed with Radiofrequency Ablation, in order to attempt gaining longer relief.   Medical Necessity: [name] has been dealing with the above chronic pain from the Right knee replacement for longer than three months and has either failed to respond, was unable to tolerate, or simply did not get enough benefit from other more conservative therapies including, but not limited to: 1. Over-the-counter medications 2. Anti-inflammatory medications 3. Muscle relaxants 4. Membrane stabilizers 5. Opioids 6. Physical therapy 7. Modalities (Heat, ice, etc.) 8. Invasive techniques such as nerve blocks. [name] has attained more than 50% relief of the pain from a series of diagnostic injections conducted in separate occasions. For this reason, I believe it is medically necessary to proceed with Radiofrequency Ablation for the  purpose of attempting to prolong the duration of the benefits seen with the diagnostic injections.  Laboratory  Chemistry  Inflammation Markers (CRP: Acute Phase) (ESR: Chronic Phase) Lab Results  Component Value Date   CRP <0.8 11/19/2016   ESRSEDRATE 20 11/19/2016   LATICACIDVEN 1.72 10/26/2017                         Renal Markers Lab Results  Component Value Date   BUN <5 (L) 10/30/2017   CREATININE 1.18 (H) 10/30/2017   BCR 6 (L) 12/04/2016   GFRAA 51 (L) 10/30/2017   GFRNONAA 44 (L) 10/30/2017                             Hepatic Markers Lab Results  Component Value Date   AST 26 10/26/2017   ALT 16 10/26/2017   ALBUMIN 3.3 (L) 10/26/2017                        Neuropathy Markers Lab Results  Component Value Date   HGBA1C 4.9 09/19/2017                        Hematology Parameters Lab Results  Component Value Date   INR 1.00 09/13/2016   LABPROT 13.2 09/13/2016   APTT 34 01/04/2011   PLT 177 10/30/2017   HGB 10.2 (L) 10/30/2017   HCT 32.7 (L) 10/30/2017                        CV Markers Lab Results  Component Value Date   BNP 60.2 01/23/2016   TROPONINI <0.03 05/23/2015                         Note: Lab results reviewed.  Recent Imaging Results   Results for orders placed in visit on 12/18/17  DG C-Arm 1-60 Min-No Report   Narrative Fluoroscopy was utilized by the requesting physician.  No radiographic  interpretation.    Interpretation Report: Fluoroscopy was used during the procedure to assist with needle guidance. The images were interpreted intraoperatively by the requesting physician.  Meds   Current Outpatient Medications:  .  albuterol (ACCUNEB) 1.25 MG/3ML nebulizer solution, Take 1 ampule by nebulization every 6 (six) hours as needed for wheezing., Disp: , Rfl:  .  albuterol (PROVENTIL HFA;VENTOLIN HFA) 108 (90 BASE) MCG/ACT inhaler, Inhale 2 puffs into the lungs every 6 (six) hours as needed for wheezing or shortness of breath. , Disp: , Rfl:  .  alendronate (FOSAMAX) 70 MG tablet, Take 70 mg by mouth every Monday. Take with a full glass of  water on an empty stomach., Disp: , Rfl:  .  amLODipine (NORVASC) 5 MG tablet, Take 5 mg by mouth daily. , Disp: , Rfl:  .  Calcium-Phosphorus-Vitamin D (CALCIUM/D3 ADULT GUMMIES PO), Take 1 tablet by mouth daily., Disp: , Rfl:  .  citalopram (CELEXA) 20 MG tablet, Take 30 mg by mouth daily. , Disp: , Rfl:  .  docusate sodium (COLACE) 100 MG capsule, Take 300 mg by mouth 2 (two) times daily as needed for moderate constipation (take 3 capsules scheduled EVERY morning & 3 capsules in the evening as needed for constipation). , Disp: , Rfl:  .  esomeprazole (NEXIUM) 40 MG capsule, Take 40 mg by mouth 2 (two) times  daily before a meal. , Disp: , Rfl:  .  Ferrous Sulfate (IRON) 325 (65 Fe) MG TABS, Take 1 tablet (325 mg total) by mouth 2 (two) times daily., Disp: 60 each, Rfl: 0 .  GINKGO BILOBA EXTRACT PO, Take 1 tablet by mouth daily., Disp: , Rfl:  .  lactose free nutrition (BOOST) LIQD, Take 237 mLs by mouth daily., Disp: , Rfl:  .  linaclotide (LINZESS) 290 MCG CAPS capsule, Take 290 mcg by mouth daily., Disp: , Rfl:  .  lovastatin (MEVACOR) 10 MG tablet, Take 10 mg by mouth daily., Disp: , Rfl:  .  meloxicam (MOBIC) 15 MG tablet, Take 15 mg by mouth daily. , Disp: , Rfl:  .  Menthol, Topical Analgesic, (BIOFREEZE EX), Apply 1 application topically daily as needed (muscle pain)., Disp: , Rfl:  .  Multiple Vitamins-Minerals (MULTIVITAMIN GUMMIES ADULT PO), Take 1 tablet by mouth daily., Disp: , Rfl:  .  ondansetron (ZOFRAN-ODT) 8 MG disintegrating tablet, Take 8 mg by mouth every 8 (eight) hours as needed for nausea or vomiting., Disp: , Rfl:  .  orphenadrine (NORFLEX) 100 MG tablet, Take 1 tablet (100 mg total) by mouth 2 (two) times daily., Disp: 60 tablet, Rfl: 2 .  oxyCODONE (OXY IR/ROXICODONE) 5 MG immediate release tablet, Take 1 tablet (5 mg total) by mouth every 8 (eight) hours as needed for severe pain., Disp: 90 tablet, Rfl: 0 .  polyethylene glycol (MIRALAX / GLYCOLAX) packet, Take 17 g by  mouth daily as needed (for constipation.). , Disp: , Rfl:  .  pregabalin (LYRICA) 75 MG capsule, Take 1 capsule (75 mg total) by mouth 2 (two) times daily., Disp: 60 capsule, Rfl: 0 .  Probiotic Product (PROBIOTIC PO), Take 1 capsule by mouth daily., Disp: , Rfl:  .  senna (SENOKOT) 8.6 MG TABS tablet, Take 2 tablets by mouth daily as needed (for constipation.). , Disp: , Rfl:  .  traZODone (DESYREL) 100 MG tablet, Take 100 mg by mouth at bedtime. , Disp: , Rfl:   ROS  Constitutional: Denies any fever or chills Gastrointestinal: No reported hemesis, hematochezia, vomiting, or acute GI distress Musculoskeletal: Denies any acute onset joint swelling, redness, loss of ROM, or weakness Neurological: No reported episodes of acute onset apraxia, aphasia, dysarthria, agnosia, amnesia, paralysis, loss of coordination, or loss of consciousness  Allergies  [name] is allergic to ampicillin; ampicillin-sulbactam sodium; ambien [zolpidem tartrate]; flexeril [cyclobenzaprine]; tape; toradol [ketorolac tromethamine]; sulfamethoxazole-trimethoprim; cephalexin; percocet [oxycodone-acetaminophen]; and tramadol.  [name]  Drug: Ms. Shropshire  reports that she does not use drugs. Alcohol:  reports that she does not drink alcohol. Tobacco:  reports that she quit smoking about 6 years ago. Her smoking use included e-cigarettes and cigarettes. She has a 45.00 pack-year smoking history. She has never used smokeless tobacco. Medical:  has a past medical history of Anemia, B12 deficiency, Bacteremia (10/07/2014), Bladder incontinence, Blind left eye, Cancer ([place]), Cataract, Cervical spine fracture ([place]), Chest pain (07/29/2013), Compression fracture, COPD (chronic obstructive pulmonary disease) ([place]), DDD (degenerative disc disease), lumbar, Depression, Diffuse myofascial pain syndrome (03/24/2015), Dyspnea, Fever (10/05/2014), GERD (gastroesophageal reflux disease), Hiatal hernia, Hyperlipidemia, Hypertension, Hypertensive  kidney disease, malignant (11/07/2016), Macular degeneration, Osteoarthritis, Osteoporosis, Pneumonia, Reactive airway disease, Rupture of bowel ([place]), Sepsis ([place]) (10/08/2014), Stroke [facility]), Stroke ([place]), and Wrist pain, acute (09/10/2012). Surgical: Ms. Heinrich  has a past surgical history that includes Abdominal surgery; Colon surgery; Appendectomy; Bladder surgery; Rectocele repair; Knee surgery; Cholecystectomy; Abdominal hysterectomy; Ostomy; Eye surgery; Intramedullary (im) nail  intertrochanteric (Right, 09/13/2016); Hip surgery (Right, 08/2016); Joint replacement (Bilateral); Toe Surgery (Right); Colostomy reversal; and Total hip arthroplasty (Right, 09/30/2017). Family: family history includes Heart attack in her father; Heart failure in her mother.  Constitutional Exam  General appearance: Well nourished, well developed, and well hydrated. In no apparent acute distress Vitals:   12/29/17 1444  BP: (!) 152/87  Pulse: (!) 59  Resp: 18  Temp: 97.9 F (36.6 C)  SpO2: 98%  Weight: 185 lb (83.9 kg)  Height: '5\' 5"'  (1.651 m)   BMI Assessment: Estimated body mass index is 30.79 kg/m as calculated from the following:   Height as of this encounter: '5\' 5"'  (1.651 m).   Weight as of this encounter: 185 lb (83.9 kg).  BMI interpretation table: BMI level Category Range association with higher incidence of chronic pain  <18 kg/m2 Underweight   18.5-24.9 kg/m2 Ideal body weight   25-29.9 kg/m2 Overweight Increased incidence by 20%  30-34.9 kg/m2 Obese (Class I) Increased incidence by 68%  35-39.9 kg/m2 Severe obesity (Class II) Increased incidence by 136%  >40 kg/m2 Extreme obesity (Class III) Increased incidence by 254%   Patient's current BMI Ideal Body weight  Body mass index is 30.79 kg/m. Ideal body weight: 57 kg (125 lb 10.6 oz) Adjusted ideal body weight: 67.8 kg (149 lb 6.4 oz)   BMI Readings from Last 4 Encounters:  12/29/17 30.79 kg/m  12/18/17 31.53 kg/m  12/09/17 28.46 kg/m   10/27/17 30.55 kg/m   Wt Readings from Last 4 Encounters:  12/29/17 185 lb (83.9 kg)  12/18/17 178 lb (80.7 kg)  12/09/17 171 lb (77.6 kg)  10/26/17 178 lb (80.7 kg)  Psych/Mental status: Alert, oriented x 3 (person, place, & time)       Eyes: PERLA Respiratory: No evidence of acute respiratory distress  Cervical Spine Area Exam  Skin & Axial Inspection: No masses, redness, edema, swelling, or associated skin lesions Alignment: Symmetrical Functional ROM: Unrestricted ROM      Stability: No instability detected Muscle Tone/Strength: Functionally intact. No obvious neuro-muscular anomalies detected. Sensory (Neurological): Unimpaired Palpation: No palpable anomalies              Upper Extremity (UE) Exam    Side: Right upper extremity  Side: Left upper extremity  Skin & Extremity Inspection: Skin color, temperature, and hair growth are WNL. No peripheral edema or cyanosis. No masses, redness, swelling, asymmetry, or associated skin lesions. No contractures.  Skin & Extremity Inspection: Skin color, temperature, and hair growth are WNL. No peripheral edema or cyanosis. No masses, redness, swelling, asymmetry, or associated skin lesions. No contractures.  Functional ROM: Unrestricted ROM          Functional ROM: Unrestricted ROM          Muscle Tone/Strength: Functionally intact. No obvious neuro-muscular anomalies detected.  Muscle Tone/Strength: Functionally intact. No obvious neuro-muscular anomalies detected.  Sensory (Neurological): Unimpaired          Sensory (Neurological): Unimpaired          Palpation: No palpable anomalies              Palpation: No palpable anomalies              Provocative Test(s):  Phalen's test: deferred Tinel's test: deferred Apley's scratch test (touch opposite shoulder):  Action 1 (Across chest): deferred Action 2 (Overhead): deferred Action 3 (LB reach): deferred   Provocative Test(s):  Phalen's test: deferred Tinel's test: deferred Apley's  scratch test (touch  opposite shoulder):  Action 1 (Across chest): deferred Action 2 (Overhead): deferred Action 3 (LB reach): deferred    Thoracic Spine Area Exam  Skin & Axial Inspection: No masses, redness, or swelling Alignment: Symmetrical Functional ROM: Unrestricted ROM Stability: No instability detected Muscle Tone/Strength: Functionally intact. No obvious neuro-muscular anomalies detected. Sensory (Neurological): Unimpaired Muscle strength & Tone: No palpable anomalies  Lumbar Spine Area Exam  Skin & Axial Inspection: No masses, redness, or swelling Alignment: Symmetrical Functional ROM: Unrestricted ROM       Stability: No instability detected Muscle Tone/Strength: Functionally intact. No obvious neuro-muscular anomalies detected. Sensory (Neurological): Movement-associated discomfort Palpation: No palpable anomalies       Provocative Tests: Hyperextension/rotation test: deferred today       Lumbar quadrant test (Kemp's test): deferred today       Lateral bending test: deferred today       Patrick's Maneuver: deferred today                   FABER test: deferred today                   S-I anterior distraction/compression test: deferred today         S-I lateral compression test: deferred today         S-I Thigh-thrust test: deferred today         S-I Gaenslen's test: deferred today           Gait & Posture Assessment  Ambulation: Patient came in today in a wheel chair Gait: Relatively normal for age and body habitus Posture: Difficulty standing up straight, due to pain   Lower Extremity Exam    Side: Right lower extremity  Side: Left lower extremity  Stability: No instability observed          Stability: No instability observed          Skin & Extremity Inspection: Skin color, temperature, and hair growth are WNL. No peripheral edema or cyanosis. No masses, redness, swelling, asymmetry, or associated skin lesions. No contractures.  Skin & Extremity Inspection: Skin  color, temperature, and hair growth are WNL. No peripheral edema or cyanosis. No masses, redness, swelling, asymmetry, or associated skin lesions. No contractures.  Functional ROM: Decreased ROM for hip and knee joints          Functional ROM: Unrestricted ROM                  Muscle Tone/Strength: Functionally intact. No obvious neuro-muscular anomalies detected.  Muscle Tone/Strength: Functionally intact. No obvious neuro-muscular anomalies detected.  Sensory (Neurological): Arthropathic arthralgia  Sensory (Neurological): Unimpaired  Palpation: Complains of area being tender to palpation  Palpation: No palpable anomalies   Assessment  Primary Diagnosis & Pertinent Problem List: The primary encounter diagnosis was Chronic knee pain (Primary Area of Pain) (Right). 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  Status Diagnosis  Persistent Persistent Improved 1. Chronic knee pain (Primary Area of Pain) (Right)   2. 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 It band syndrome, right   4. 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 Hx of total knee replacement (Bilateral)   6. Chronic myofascial pain   7. Neurogenic pain  Problems updated and reviewed during this visit: No problems updated. Plan of Care  Pharmacotherapy (Medications Ordered): Meds ordered this encounter  Medications  . pregabalin (LYRICA) 75 MG capsule    Sig: Take 1 capsule (75 mg total) by mouth 2 (two) times daily.    Dispense:  60 capsule    Refill:  0    Do not place this medication, or any other prescription from our practice, on "Automatic Refill". Patient may have prescription filled one day early if pharmacy is closed on scheduled refill date.  Marland Kitchen  oxyCODONE (OXY IR/ROXICODONE) 5 MG immediate release tablet    Sig: Take 1 tablet (5 mg total) by mouth every 8 (eight) hours as needed for severe pain.    Dispense:  90 tablet    Refill:  0    Do not place this medication, or any other prescription from our practice, on "Automatic Refill". Patient may have prescription filled one day early if pharmacy is closed on scheduled refill date. Do not fill until: 12/29/17 To last until: 01/28/18   Medications administered today: Horris Latino L. [place] had no medications administered during this visit.   Procedure Orders     Radiofrequency,Genicular Lab Orders  No laboratory test(s) ordered today   Imaging Orders  No imaging studies ordered today   Referral Orders  No referral(s) requested today    Interventional management options: Planned, scheduled, and/or pending:   Therapeutic right-sided genicular nerve RFA under fluoroscopic guidance and IV sedation    Considering:   Diagnostic right-sided genicular nerve block #2 Possible right-sided genicular nerve RFA Diagnostic right Femoral + Obturator nerve block #1  Possible right Femoral + Obturator nerve RFA  Diagnostic left cervical epidural steroid injection Diagnostic bilateral cervical facet blocks Diagnostic bilateral intra-articular shoulder joint injection Diagnostic bilateral suprascapular nerve block Possible bilateral suprascapular nerve RFA Diagnostic right-sided lumbar facet block #3 Possible right-sided lumbar facet RFA Diagnostic right-sided sacroiliac joint block #3  Possible right-sided sacroiliac joint RFA    Palliative PRN treatment(s):   None at this time   Provider-requested follow-up: Return for RFA (fluoro + sedation): (R) Genicular Nerve RFA.  Future Appointments  Date Time Provider [place]  03/09/2018 11:00 AM Vevelyn Francois, NP [facility] None  04/29/2018  1:30 PM Penumalli, Earlean Polka, MD [facility] None   Primary Care Physician: Wenda Low, MD Location: [facility] Note by: Gaspar Cola, MD Date: 12/29/2017; Time: 4:43 PM

## 2017-12-29 ENCOUNTER — Ambulatory Visit: Payer: Medicare Other | Attending: Pain Medicine | Admitting: Pain Medicine

## 2017-12-29 ENCOUNTER — Other Ambulatory Visit: Payer: Self-pay

## 2017-12-29 ENCOUNTER — Encounter: Payer: Self-pay | Admitting: Pain Medicine

## 2017-12-29 VITALS — BP 152/87 | HR 59 | Temp 97.9°F | Resp 18 | Ht 65.0 in | Wt 185.0 lb

## 2017-12-29 DIAGNOSIS — G894 Chronic pain syndrome: Secondary | ICD-10-CM | POA: Insufficient documentation

## 2017-12-29 DIAGNOSIS — Z880 Allergy status to penicillin: Secondary | ICD-10-CM | POA: Insufficient documentation

## 2017-12-29 DIAGNOSIS — Z96653 Presence of artificial knee joint, bilateral: Secondary | ICD-10-CM | POA: Insufficient documentation

## 2017-12-29 DIAGNOSIS — M7631 Iliotibial band syndrome, right leg: Secondary | ICD-10-CM | POA: Diagnosis not present

## 2017-12-29 DIAGNOSIS — G8929 Other chronic pain: Secondary | ICD-10-CM

## 2017-12-29 DIAGNOSIS — M810 Age-related osteoporosis without current pathological fracture: Secondary | ICD-10-CM | POA: Insufficient documentation

## 2017-12-29 DIAGNOSIS — Z79891 Long term (current) use of opiate analgesic: Secondary | ICD-10-CM | POA: Diagnosis not present

## 2017-12-29 DIAGNOSIS — Z96651 Presence of right artificial knee joint: Secondary | ICD-10-CM | POA: Diagnosis not present

## 2017-12-29 DIAGNOSIS — F329 Major depressive disorder, single episode, unspecified: Secondary | ICD-10-CM | POA: Insufficient documentation

## 2017-12-29 DIAGNOSIS — M533 Sacrococcygeal disorders, not elsewhere classified: Secondary | ICD-10-CM | POA: Insufficient documentation

## 2017-12-29 DIAGNOSIS — Z885 Allergy status to narcotic agent status: Secondary | ICD-10-CM | POA: Insufficient documentation

## 2017-12-29 DIAGNOSIS — E538 Deficiency of other specified B group vitamins: Secondary | ICD-10-CM | POA: Diagnosis not present

## 2017-12-29 DIAGNOSIS — M25561 Pain in right knee: Secondary | ICD-10-CM | POA: Diagnosis not present

## 2017-12-29 DIAGNOSIS — D649 Anemia, unspecified: Secondary | ICD-10-CM | POA: Insufficient documentation

## 2017-12-29 DIAGNOSIS — K449 Diaphragmatic hernia without obstruction or gangrene: Secondary | ICD-10-CM | POA: Diagnosis not present

## 2017-12-29 DIAGNOSIS — K219 Gastro-esophageal reflux disease without esophagitis: Secondary | ICD-10-CM | POA: Diagnosis not present

## 2017-12-29 DIAGNOSIS — Z881 Allergy status to other antibiotic agents status: Secondary | ICD-10-CM | POA: Diagnosis not present

## 2017-12-29 DIAGNOSIS — N183 Chronic kidney disease, stage 3 (moderate): Secondary | ICD-10-CM | POA: Insufficient documentation

## 2017-12-29 DIAGNOSIS — J449 Chronic obstructive pulmonary disease, unspecified: Secondary | ICD-10-CM | POA: Insufficient documentation

## 2017-12-29 DIAGNOSIS — Z87891 Personal history of nicotine dependence: Secondary | ICD-10-CM | POA: Insufficient documentation

## 2017-12-29 DIAGNOSIS — M5136 Other intervertebral disc degeneration, lumbar region: Secondary | ICD-10-CM | POA: Diagnosis not present

## 2017-12-29 DIAGNOSIS — E7800 Pure hypercholesterolemia, unspecified: Secondary | ICD-10-CM | POA: Insufficient documentation

## 2017-12-29 DIAGNOSIS — Z79899 Other long term (current) drug therapy: Secondary | ICD-10-CM | POA: Diagnosis not present

## 2017-12-29 DIAGNOSIS — Z8673 Personal history of transient ischemic attack (TIA), and cerebral infarction without residual deficits: Secondary | ICD-10-CM | POA: Insufficient documentation

## 2017-12-29 DIAGNOSIS — Z888 Allergy status to other drugs, medicaments and biological substances status: Secondary | ICD-10-CM | POA: Insufficient documentation

## 2017-12-29 DIAGNOSIS — Z882 Allergy status to sulfonamides status: Secondary | ICD-10-CM | POA: Insufficient documentation

## 2017-12-29 DIAGNOSIS — I129 Hypertensive chronic kidney disease with stage 1 through stage 4 chronic kidney disease, or unspecified chronic kidney disease: Secondary | ICD-10-CM | POA: Insufficient documentation

## 2017-12-29 DIAGNOSIS — M7918 Myalgia, other site: Secondary | ICD-10-CM | POA: Insufficient documentation

## 2017-12-29 DIAGNOSIS — M1611 Unilateral primary osteoarthritis, right hip: Secondary | ICD-10-CM | POA: Insufficient documentation

## 2017-12-29 DIAGNOSIS — F419 Anxiety disorder, unspecified: Secondary | ICD-10-CM | POA: Diagnosis not present

## 2017-12-29 DIAGNOSIS — M792 Neuralgia and neuritis, unspecified: Secondary | ICD-10-CM

## 2017-12-29 DIAGNOSIS — Z96641 Presence of right artificial hip joint: Secondary | ICD-10-CM | POA: Insufficient documentation

## 2017-12-29 IMAGING — CR DG HIP (WITH OR WITHOUT PELVIS) 2-3V*L*
3 series · 3 of 3 positions shown · non-contrast
Comparison: None.

CLINICAL DATA: Syncopal episode falling onto the left side with
left-sided pain.

EXAM:
DG HIP (WITH OR WITHOUT PELVIS) 2-3V LEFT

[hip ap (1 of 2)]
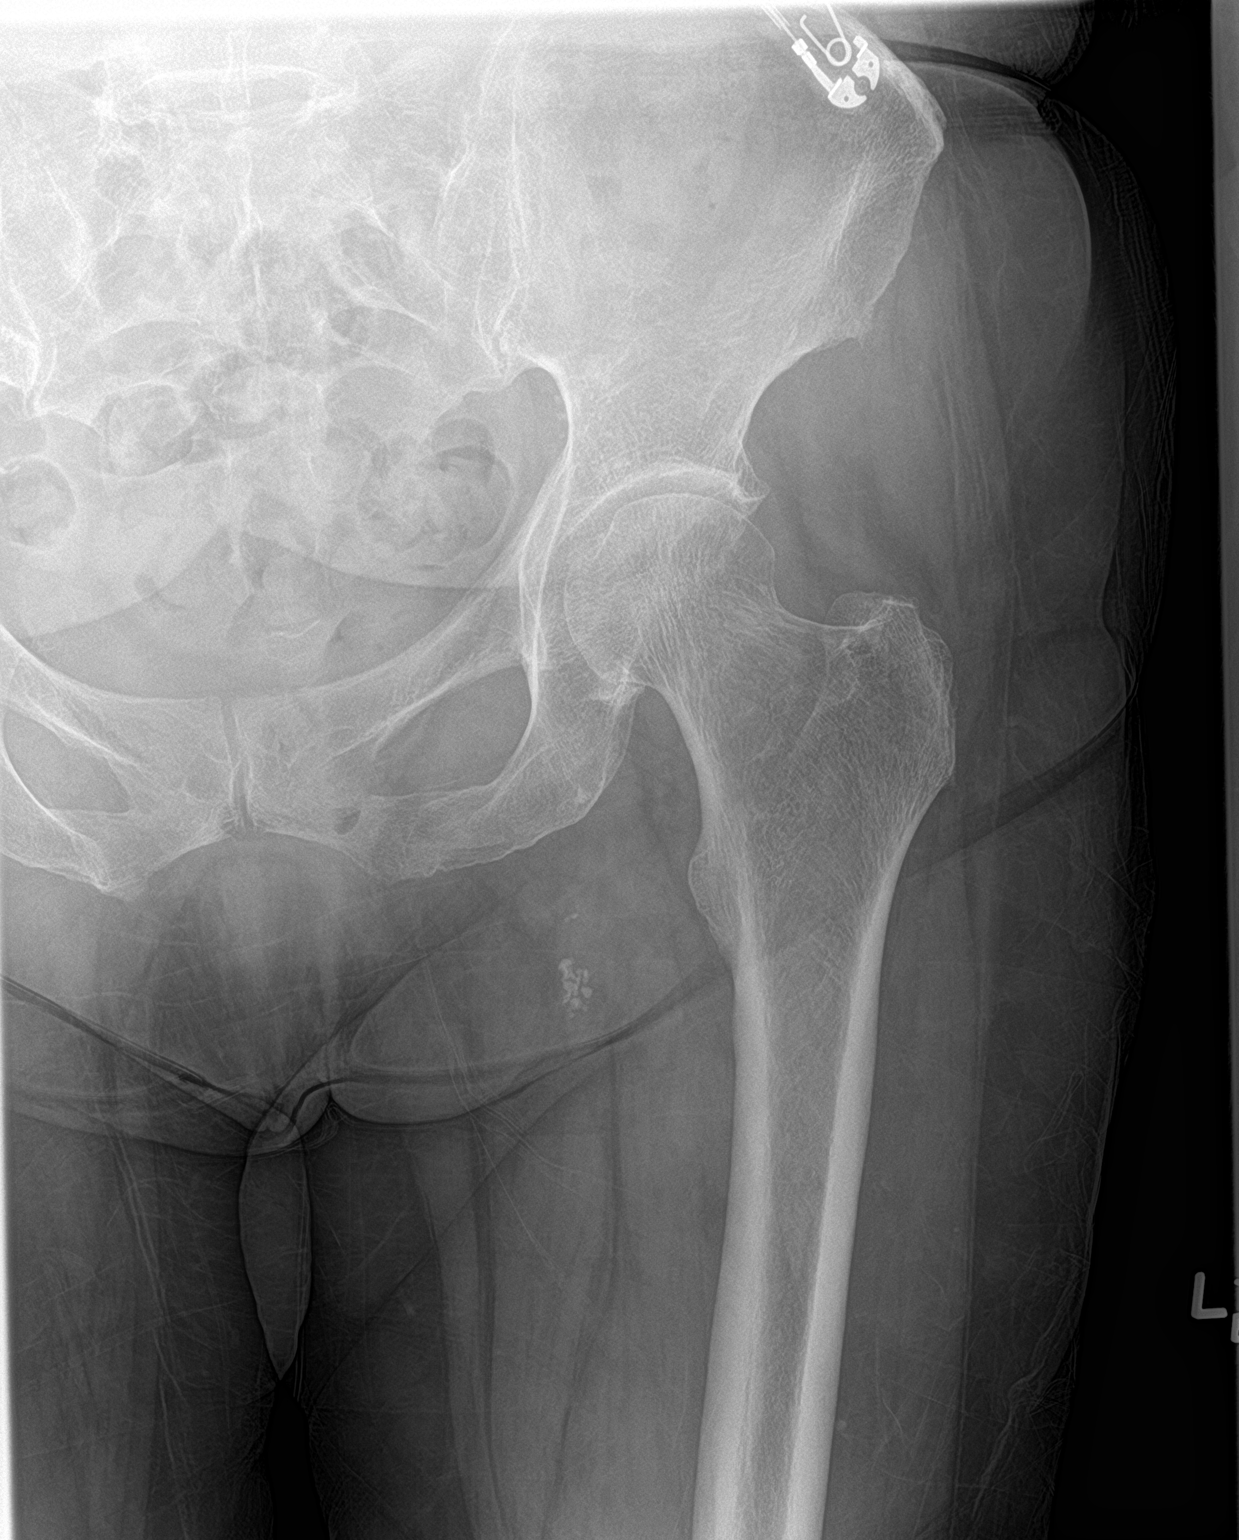

[hip lat]
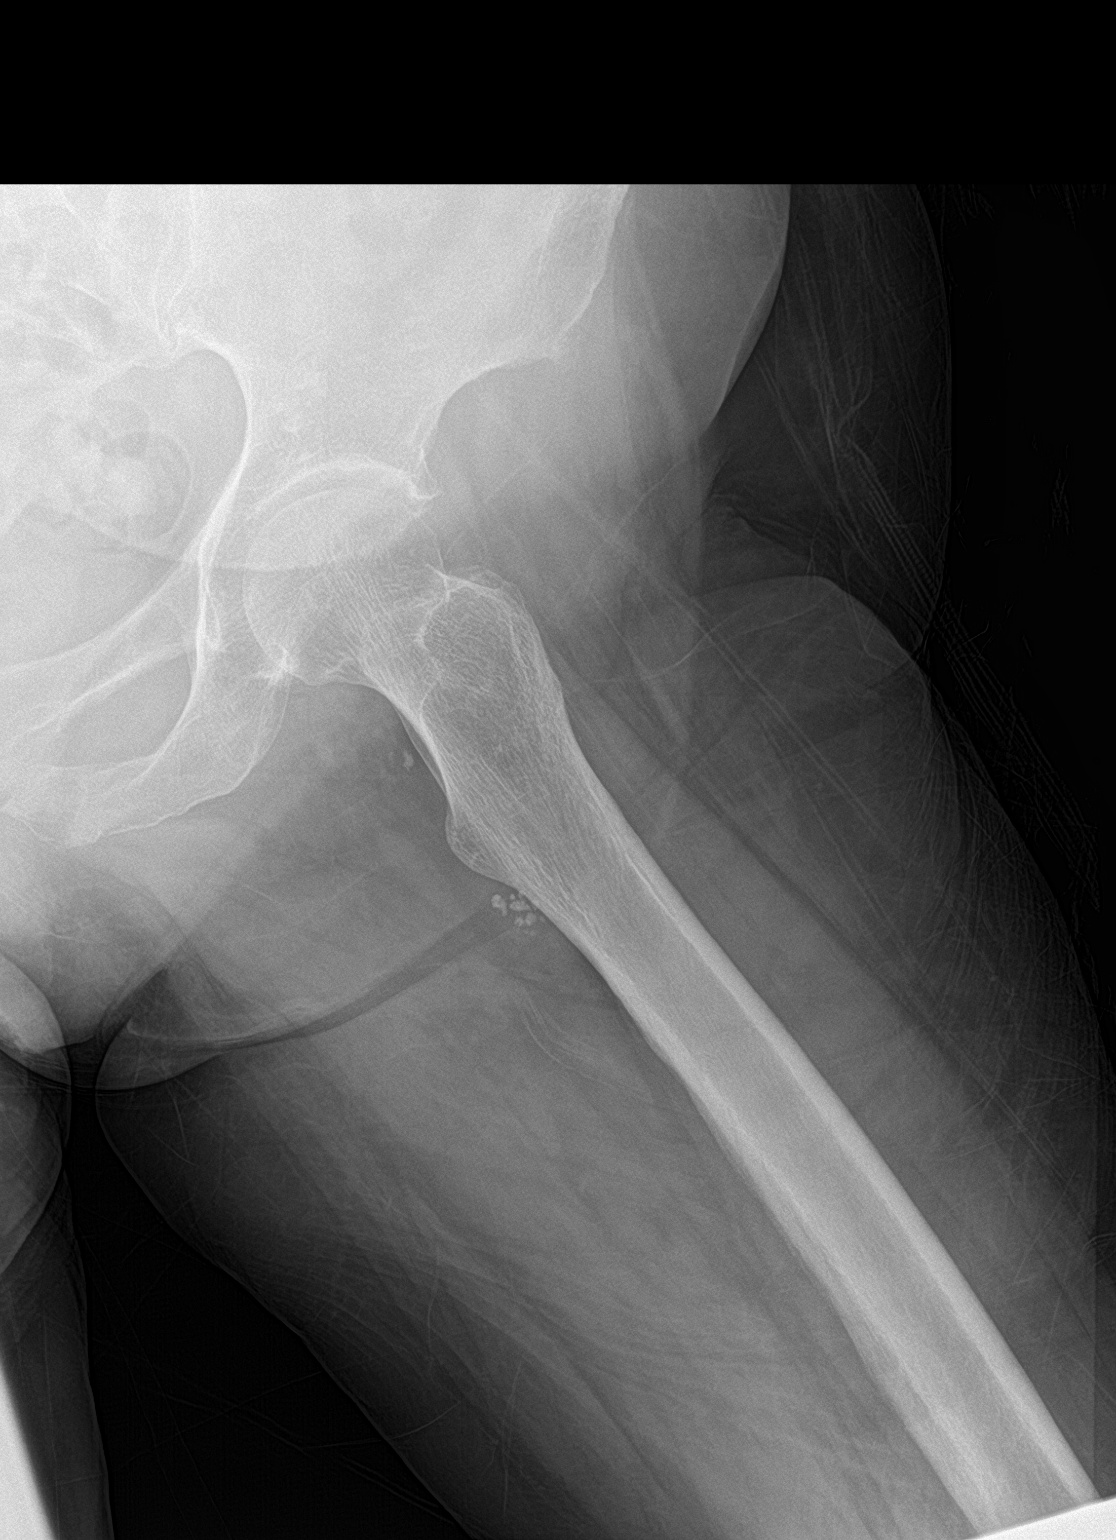

[hip ap (2 of 2)]
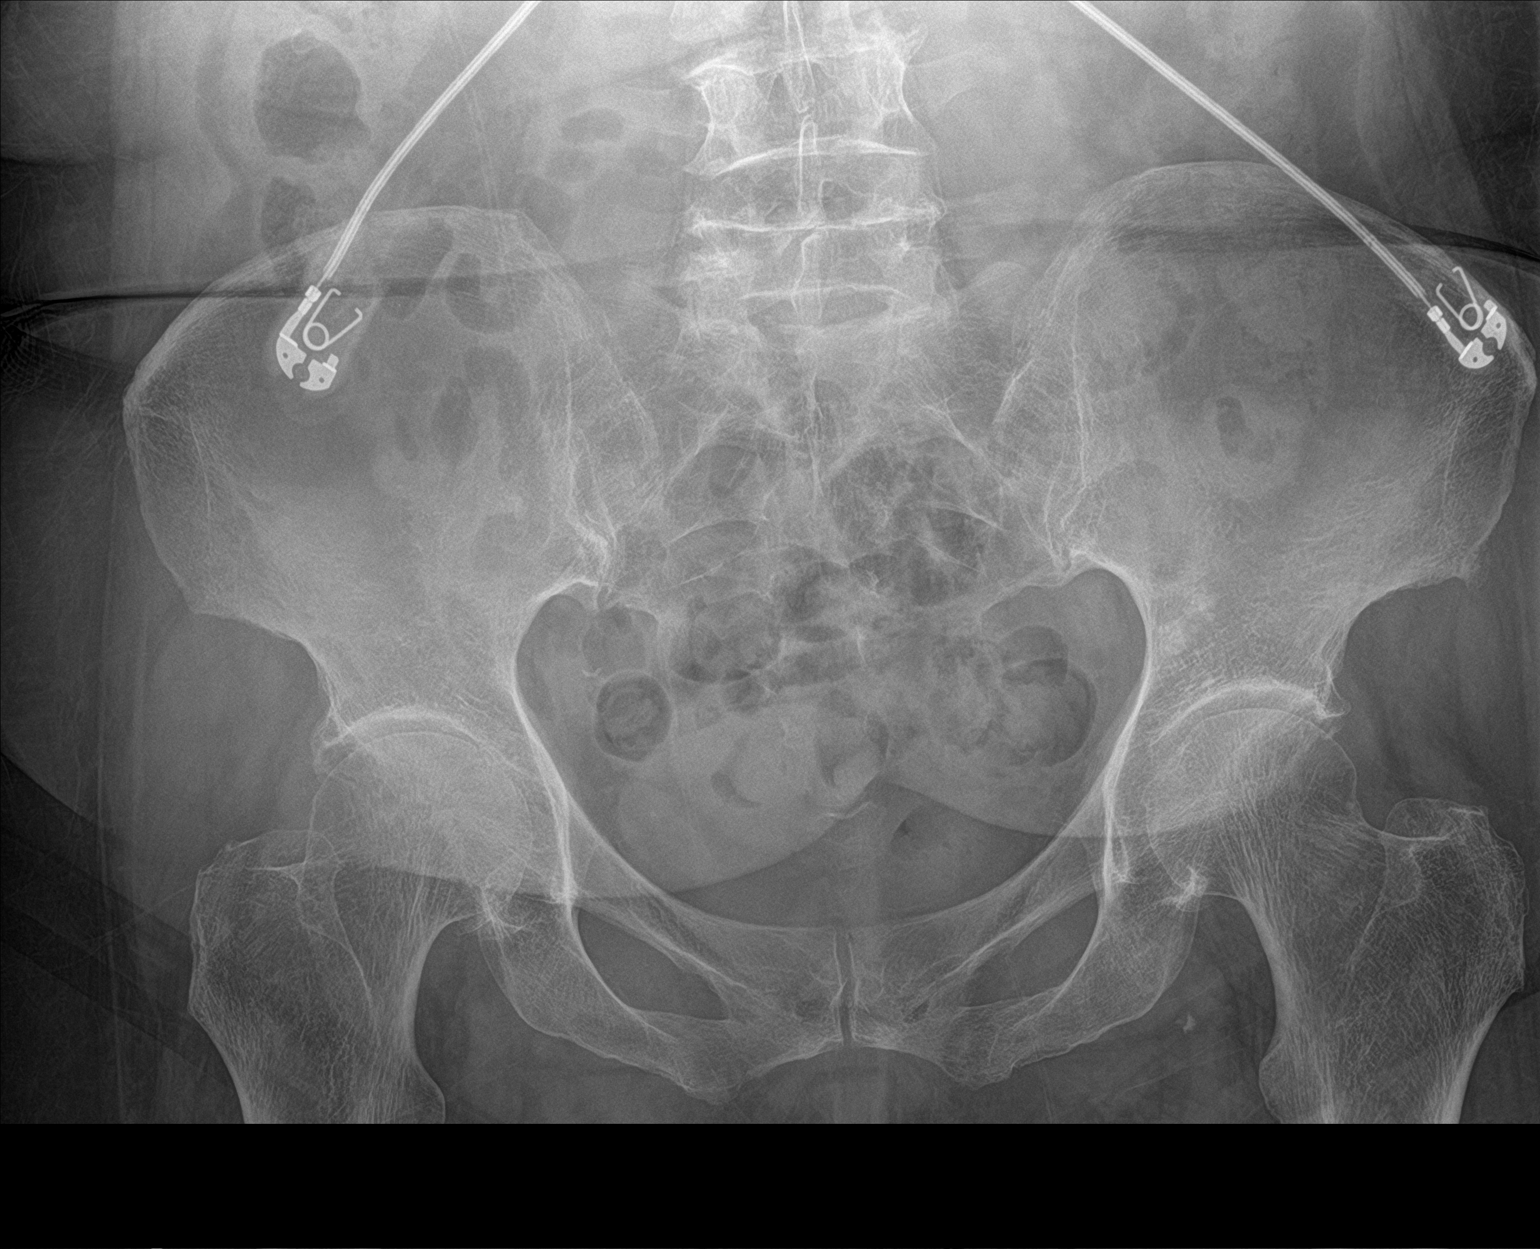

[3 of 3 positions shown; findings below may reference images not displayed]

FINDINGS: No pelvic fracture. No hip fracture. No significant degenerative
change.
IMPRESSION: Negative radiographs.

## 2017-12-29 IMAGING — CR DG SHOULDER 2+V*L*
3 series · 3 of 3 positions shown · non-contrast
Comparison: 04/24/2015

CLINICAL DATA: Syncopal episode, falling onto the left shoulder was
subsequent pain.

EXAM:
LEFT SHOULDER - 2+ VIEW

[shoulder grashey]
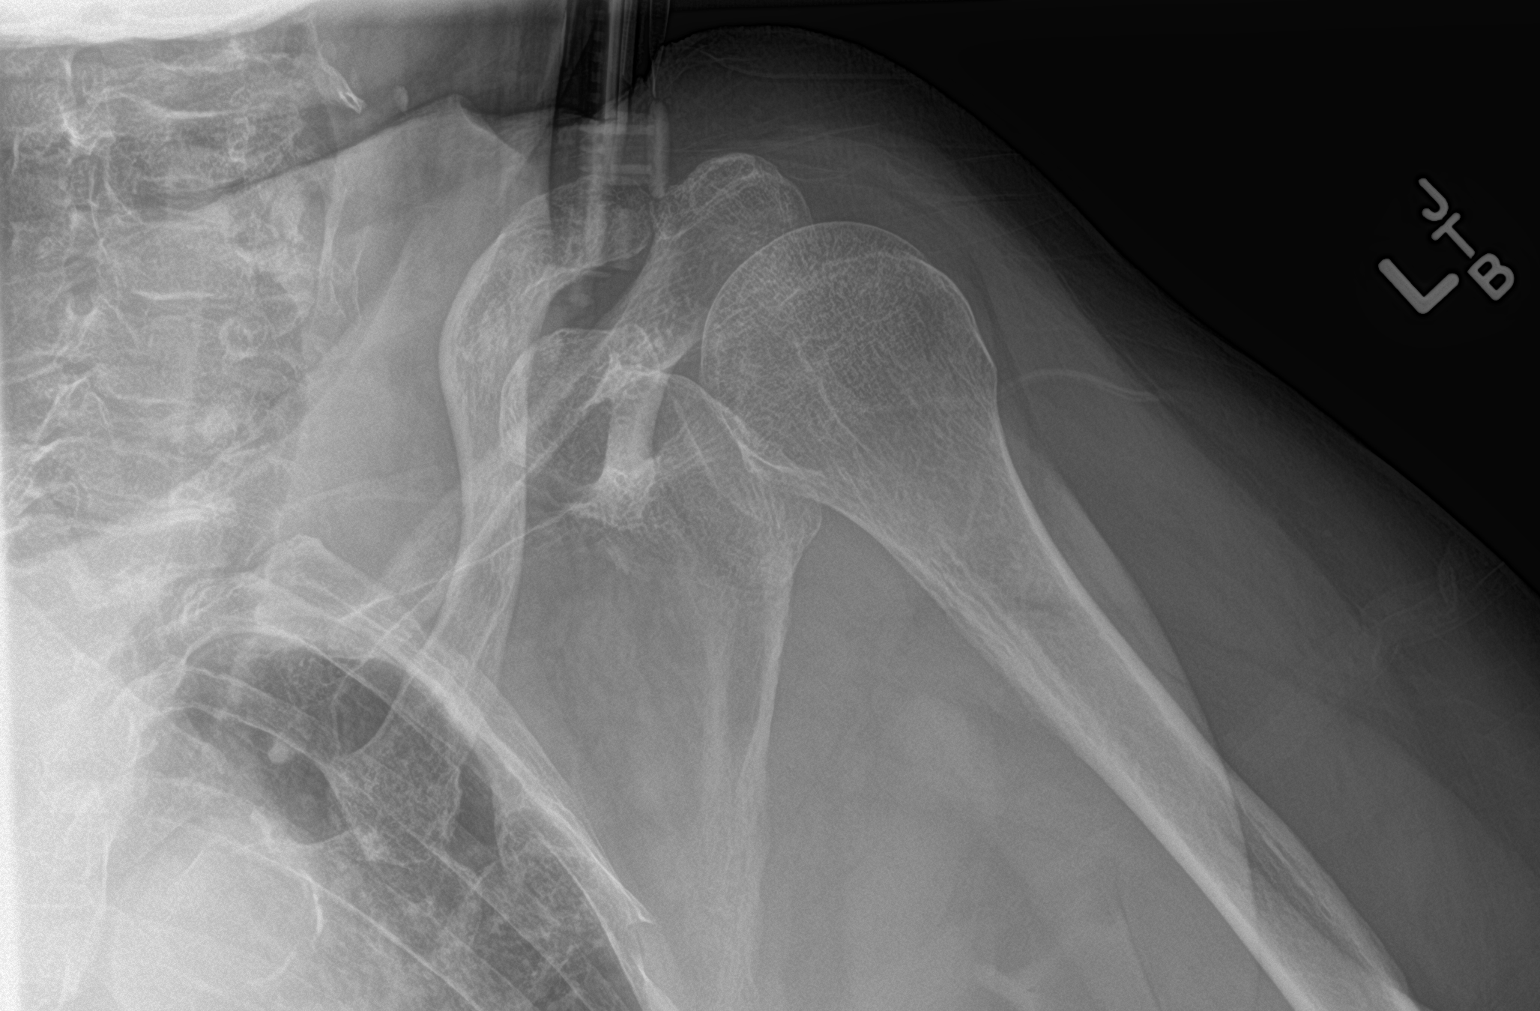

[shoulder y view]
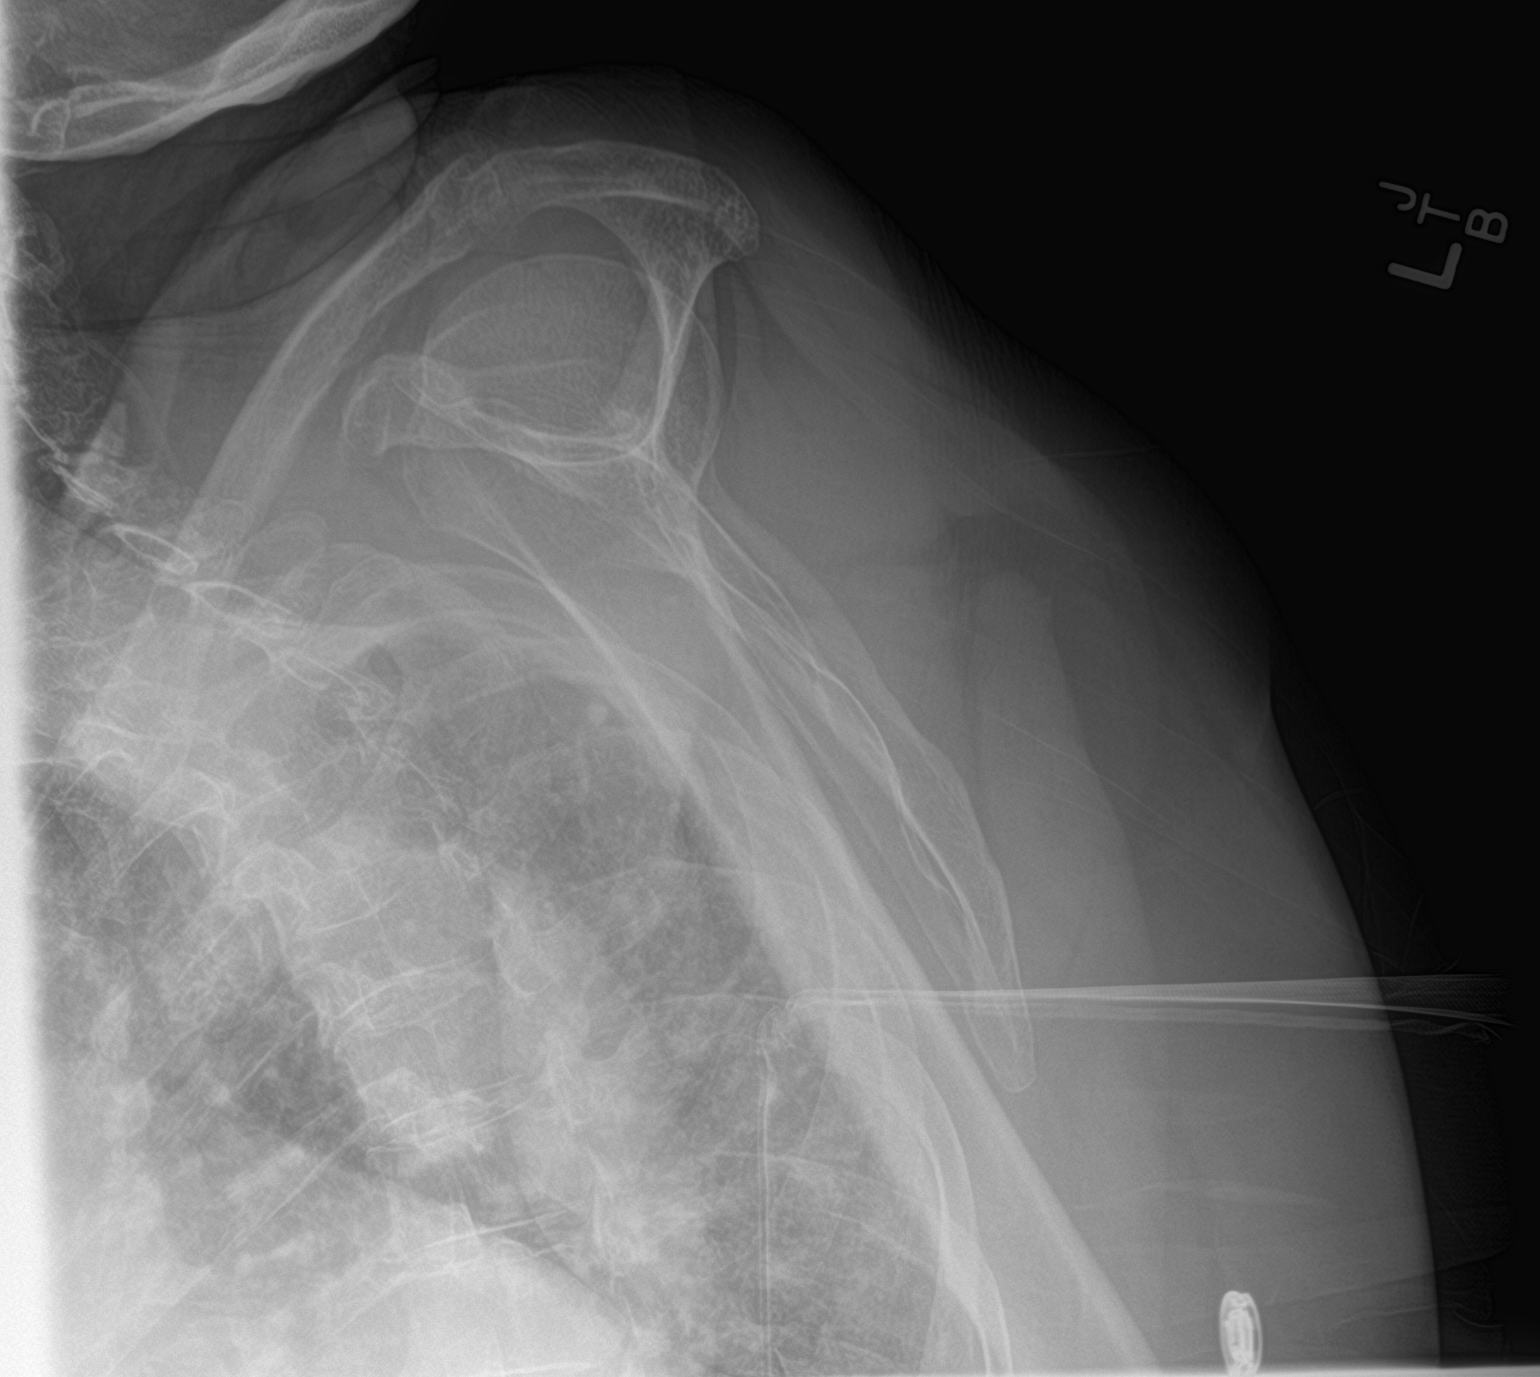

[shoulder ap neutral]
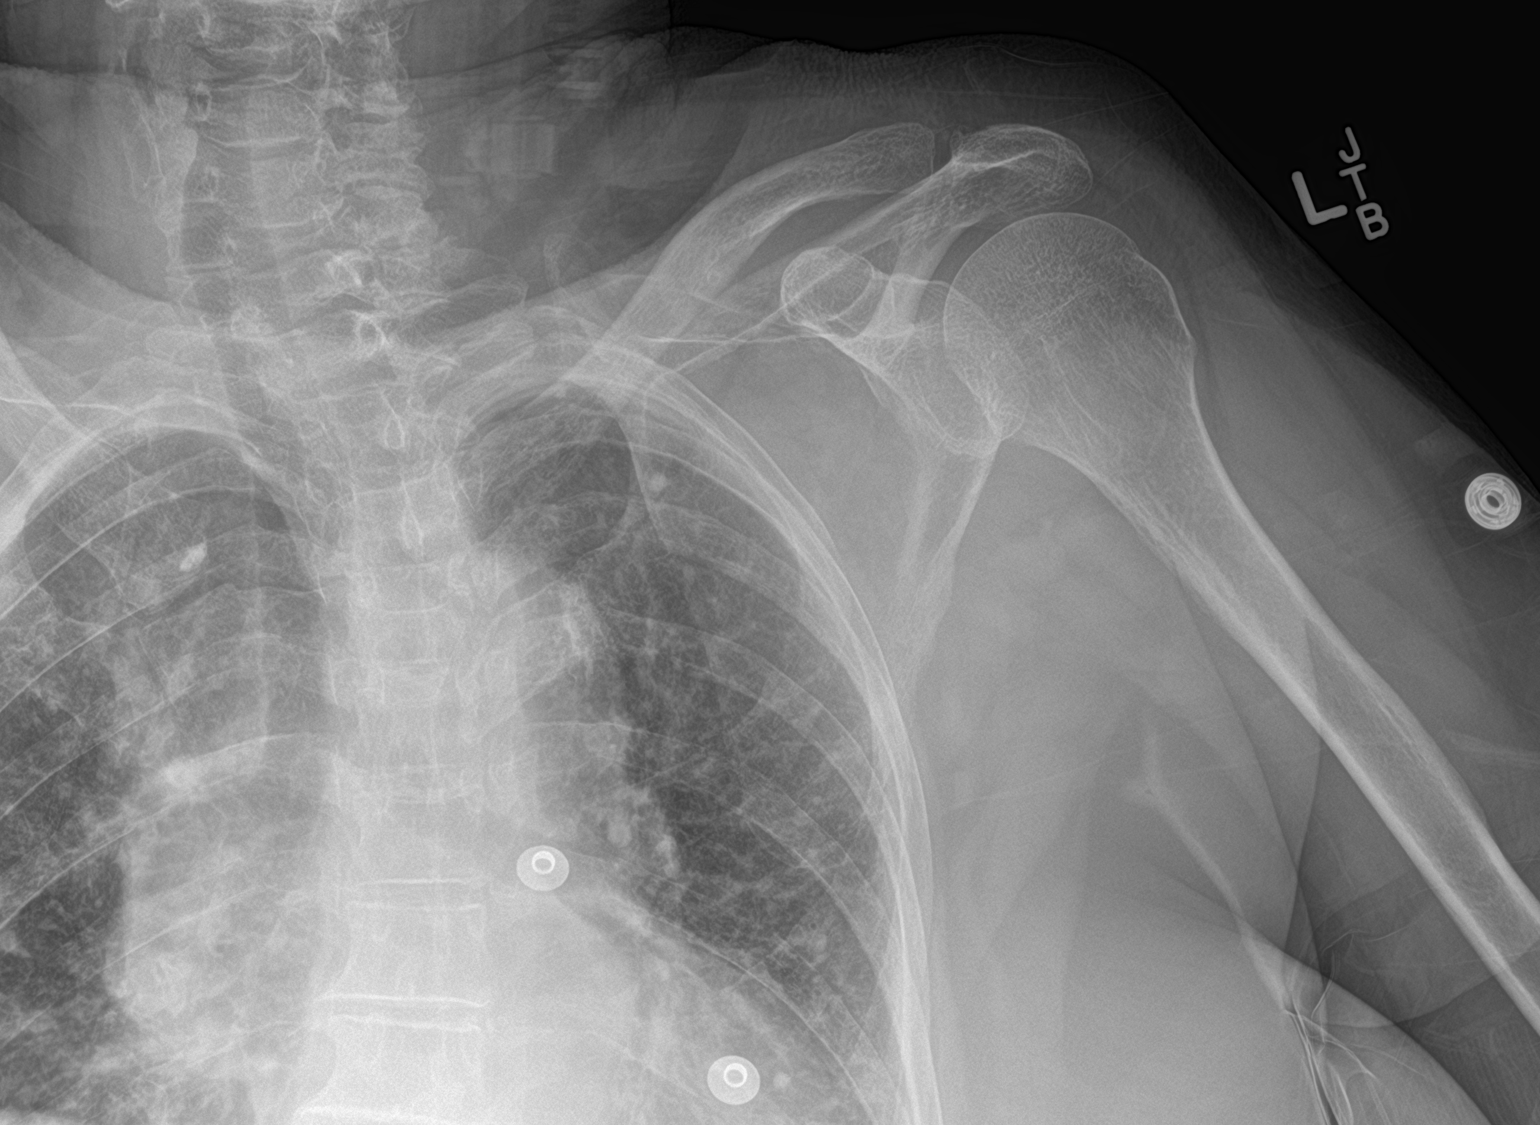

[3 of 3 positions shown; findings below may reference images not displayed]

FINDINGS: Humeral head is properly located. No evidence of regional fracture.
No degenerative change. Normal humeral acromial distance.
IMPRESSION: Negative radiographs.

## 2017-12-29 IMAGING — CT CT HEAD W/O CM
4 of 8 series · 20 of 47 positions shown, 22 images · non-contrast
Comparison: MRI cervical spine 08/30/2015. CT cervical spine
05/12/2015.

CLINICAL DATA: Syncopal episode, headache rollover.  Low neck pain.

EXAM:
CT HEAD WITHOUT CONTRAST
CT CERVICAL SPINE WITHOUT CONTRAST
TECHNIQUE: Multidetector CT imaging of the head and cervical spine was
performed following the standard protocol without intravenous
contrast. Multiplanar CT image reconstructions of the cervical spine
were also generated.

[Series 302: soft tissue, idose (2) · axial · 0.39mm/px · z∈[+54,+188]mm · 8 of 87 slices shown, 10 images]
[im 10/87  brain]
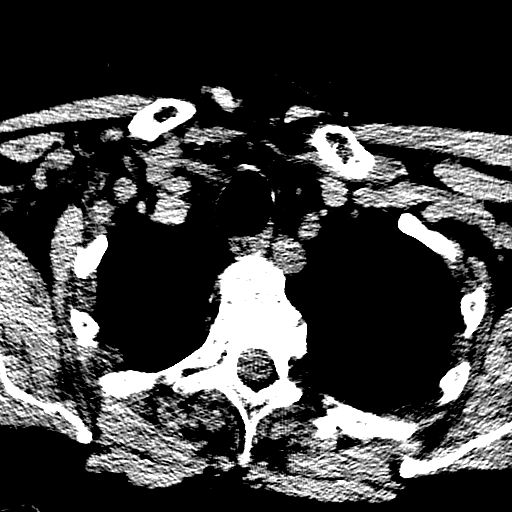
[im 10/87  bone]
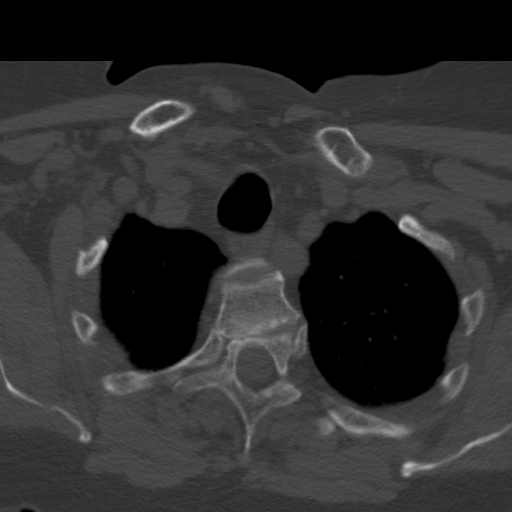
[im 20/87  brain]
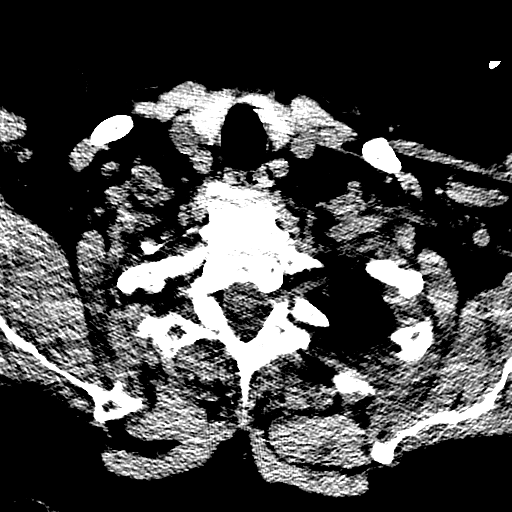
[im 29/87  brain]
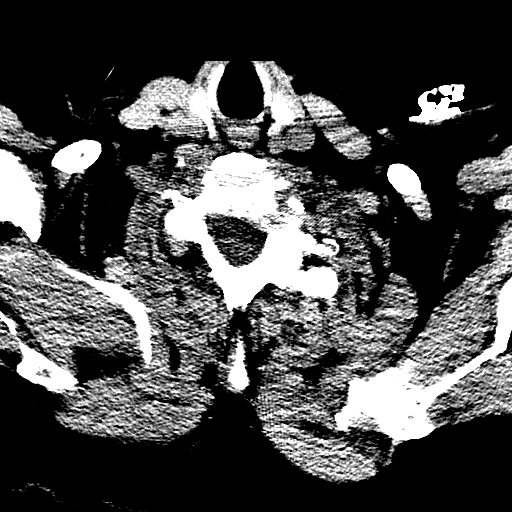
[im 39/87  brain]
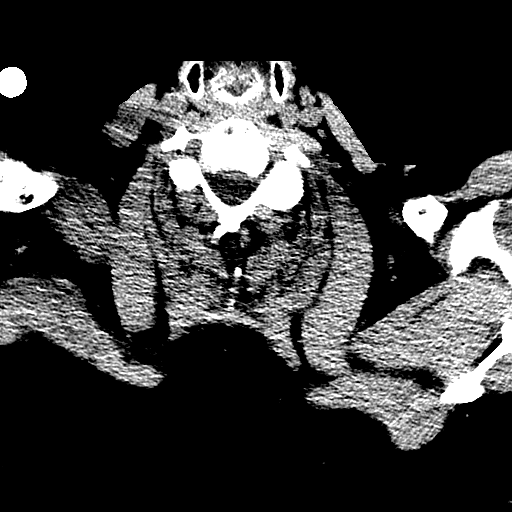
[im 48/87  brain]
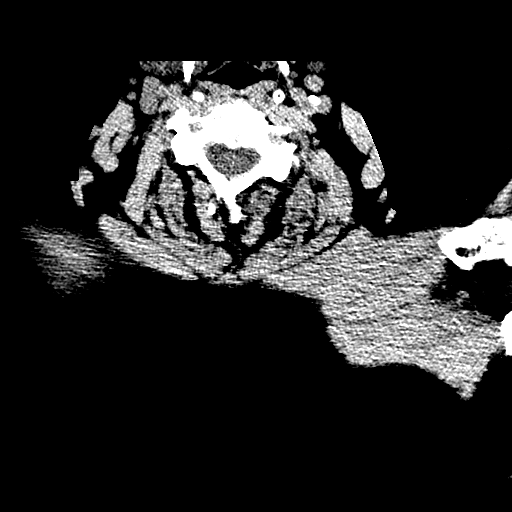
[im 48/87  bone]
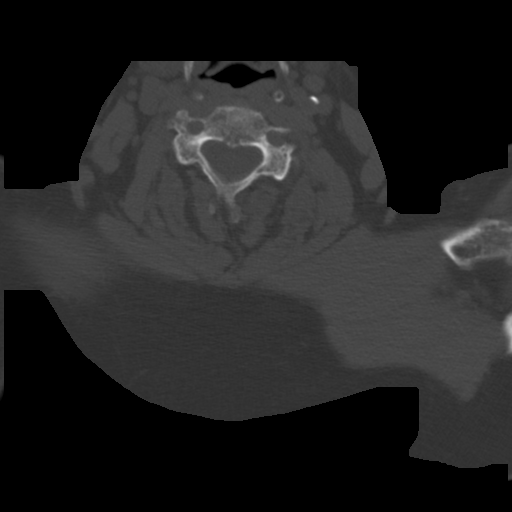
[im 58/87  brain]
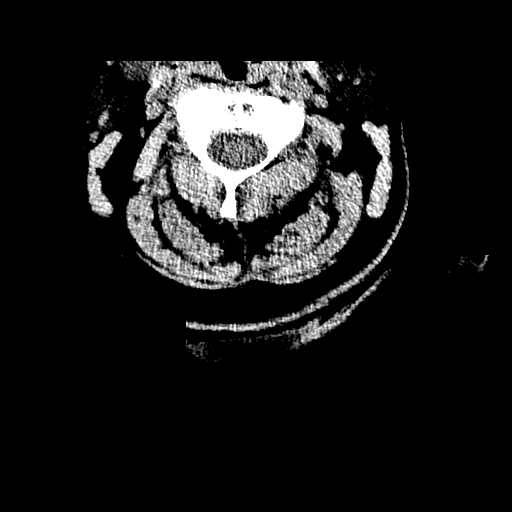
[im 67/87  brain]
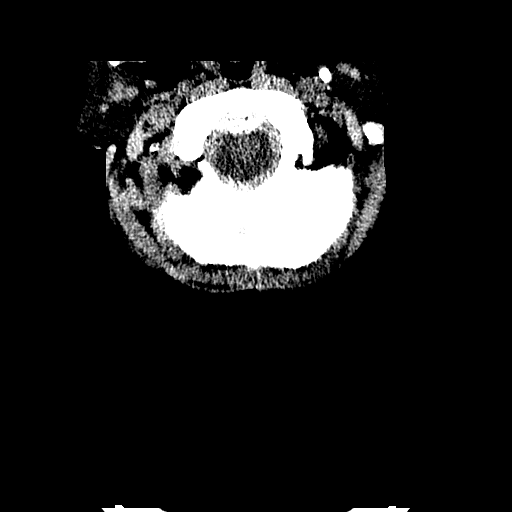
[im 77/87  brain]
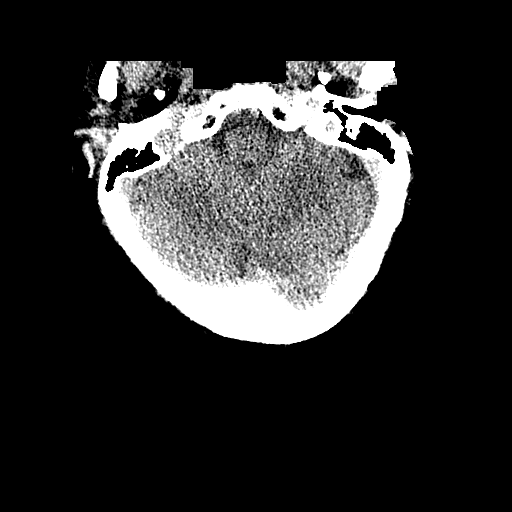

[Series 304: coronal, idose (2) · coronal · 0.35mm/px · 3 of 100 slices shown]
[im 54/100  brain]
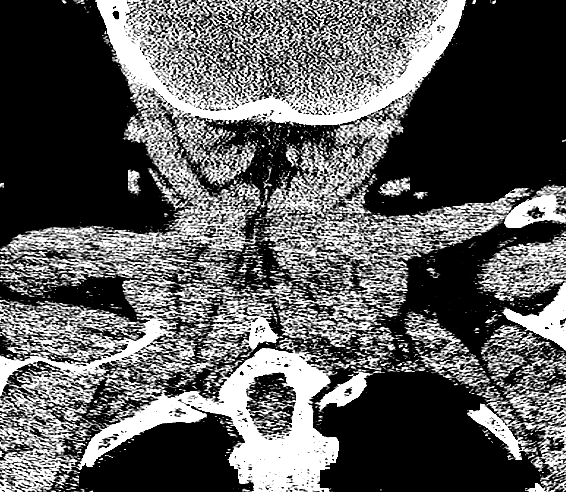
[im 69/100  brain]
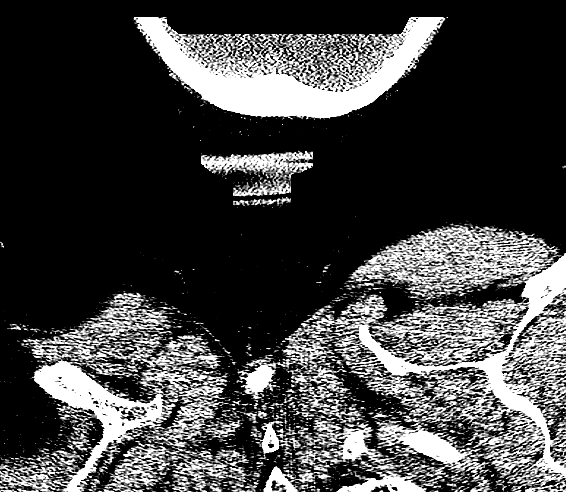
[im 84/100  brain]
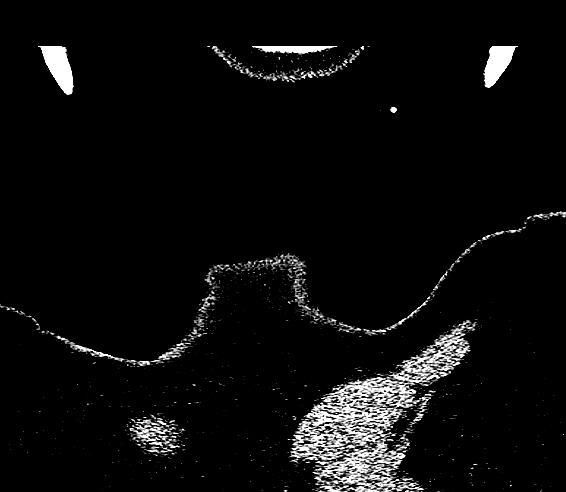

[Series 306: sagittal, idose (2) · sagittal · 0.34mm/px · 2 of 100 slices shown]
[im 34/100  brain]
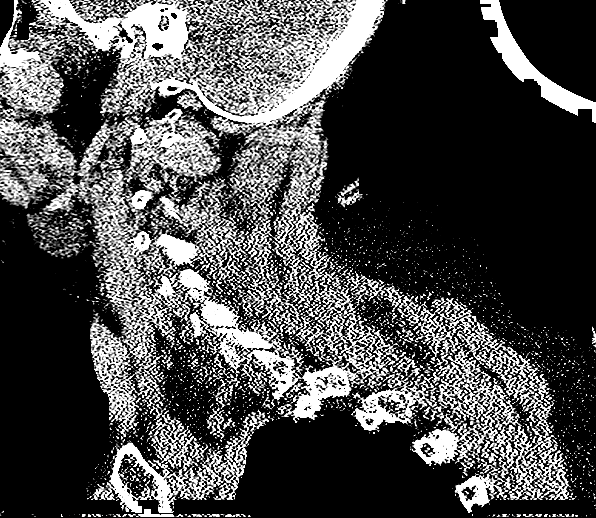
[im 67/100  brain]
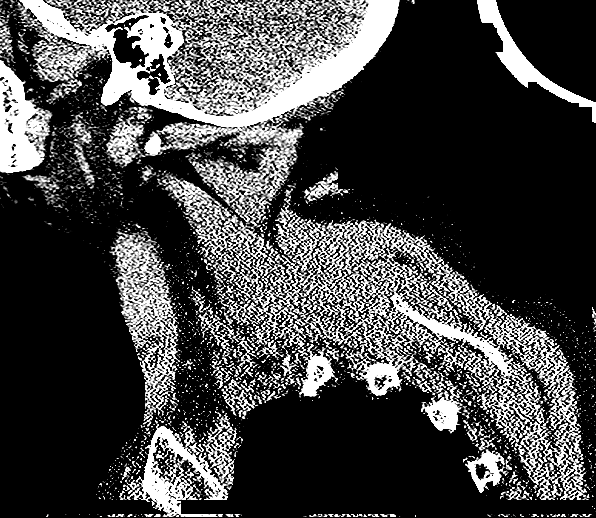

[Series 308: orthogonals, idose (2) · axial · 0.52mm/px · z∈[+27,+141]mm · 7 of 90 slices shown]
[im 10/90  brain]
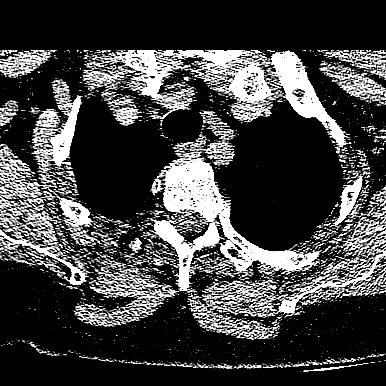
[im 20/90  brain]
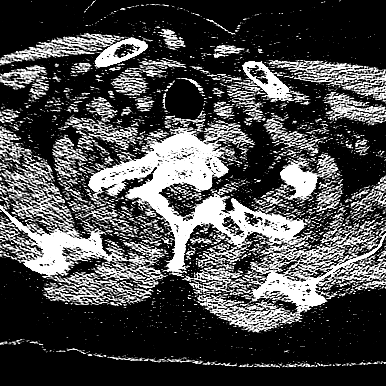
[im 30/90  brain]
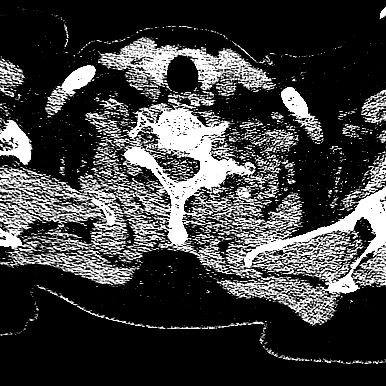
[im 40/90  brain]
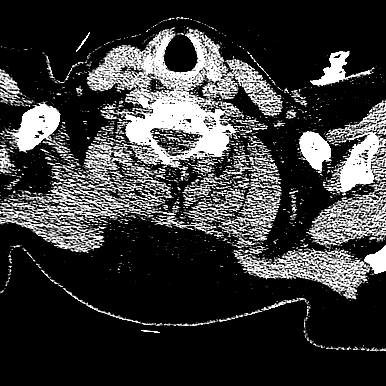
[im 50/90  brain]
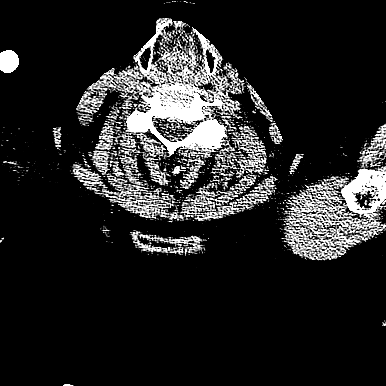
[im 60/90  brain]
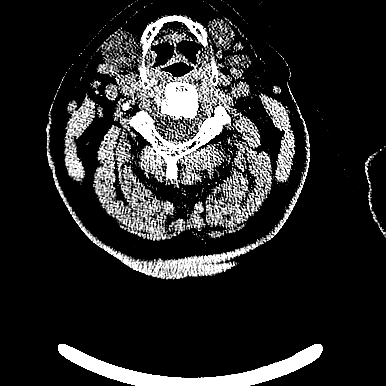
[im 70/90  brain]
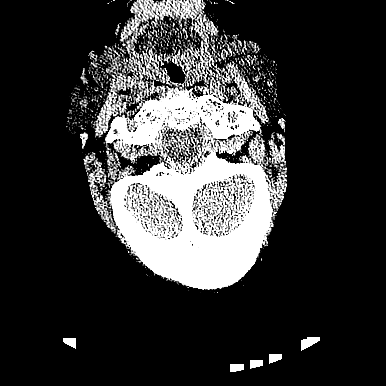

[20 of 47 positions shown; findings below may reference images not displayed]

FINDINGS: CT HEAD FINDINGS

Brain: No intracranial hemorrhage. No parenchymal contusion. No
midline shift or mass effect. Basilar cisterns are patent. No skull
base fracture. No fluid in the paranasal sinuses or mastoid air
cells. Orbits are normal.

Generalized atrophy.

Vascular: No hyperdense vessel or unexpected calcification.

Skull: Normal. Negative for fracture or focal lesion.

Sinuses/Orbits: No acute finding.

Other: None

CT CERVICAL SPINE FINDINGS

Alignment: Normal.

Skull base and vertebrae: No acute fracture. No primary bone lesion
or focal pathologic process.

Soft tissues and spinal canal: No prevertebral fluid or swelling. No
visible canal hematoma.

Disc levels: No acute findings in the distal vertebral bodies. There
is endplate deformity at C7 with approximately 20% loss vertebral
body height.

Upper chest: Clear

Other: None
IMPRESSION: 1. No intracranial trauma.
2. Mild atrophy.
3. No cervical spine fracture.
4. Chronic compression deformity at T7.

## 2017-12-29 MED ORDER — PREGABALIN 75 MG PO CAPS: 75.0000 mg | ORAL_CAPSULE | Freq: Two times a day (BID) | ORAL | 0 refills | Status: DC

## 2017-12-29 MED ORDER — OXYCODONE HCL 5 MG PO TABS: 5.0000 mg | ORAL_TABLET | Freq: Three times a day (TID) | ORAL | 0 refills | Status: DC | PRN

## 2017-12-29 NOTE — Patient Instructions (Addendum)
____________________________________________________________________________________________  Pain Scale  Introduction: The pain score used by this practice is the Verbal Numerical Rating Scale (VNRS-11). This is an 11-point scale. It is for adults and children 10 years or older. There are significant differences in how the pain score is reported, used, and applied. Forget everything you learned in the past and learn this scoring system.  General Information: The scale should reflect your current level of pain. Unless you are specifically asked for the level of your worst pain, or your average pain. If you are asked for one of these two, then it should be understood that it is over the past 24 hours.  Basic Activities of Daily Living (ADL): Personal hygiene, dressing, eating, transferring, and using restroom.  Instructions: Most patients tend to report their level of pain as a combination of two factors, their physical pain and their psychosocial pain. This last one is also known as "suffering" and it is reflection of how physical pain affects you socially and psychologically. From now on, report them separately. From this point on, when asked to report your pain level, report only your physical pain. Use the following table for reference.  Pain Clinic Pain Levels (0-5/10)  Pain Level Score  Description  No Pain 0   Mild pain 1 Nagging, annoying, but does not interfere with basic activities of daily living (ADL). Patients are able to eat, bathe, get dressed, toileting (being able to get on and off the toilet and perform personal hygiene functions), transfer (move in and out of bed or a chair without assistance), and maintain continence (able to control bladder and bowel functions). Blood pressure and heart rate are unaffected. A normal heart rate for a healthy adult ranges from 60 to 100 bpm (beats per minute).   Mild to moderate pain 2 Noticeable and distracting. Impossible to hide from other  people. More frequent flare-ups. Still possible to adapt and function close to normal. It can be very annoying and may have occasional stronger flare-ups. With discipline, patients may get used to it and adapt.   Moderate pain 3 Interferes significantly with activities of daily living (ADL). It becomes difficult to feed, bathe, get dressed, get on and off the toilet or to perform personal hygiene functions. Difficult to get in and out of bed or a chair without assistance. Very distracting. With effort, it can be ignored when deeply involved in activities.   Moderately severe pain 4 Impossible to ignore for more than a few minutes. With effort, patients may still be able to manage work or participate in some social activities. Very difficult to concentrate. Signs of autonomic nervous system discharge are evident: dilated pupils (mydriasis); mild sweating (diaphoresis); sleep interference. Heart rate becomes elevated (>115 bpm). Diastolic blood pressure (lower number) rises above 100 mmHg. Patients find relief in laying down and not moving.   Severe pain 5 Intense and extremely unpleasant. Associated with frowning face and frequent crying. Pain overwhelms the senses.  Ability to do any activity or maintain social relationships becomes significantly limited. Conversation becomes difficult. Pacing back and forth is common, as getting into a comfortable position is nearly impossible. Pain wakes you up from deep sleep. Physical signs will be obvious: pupillary dilation; increased sweating; goosebumps; brisk reflexes; cold, clammy hands and feet; nausea, vomiting or dry heaves; loss of appetite; significant sleep disturbance with inability to fall asleep or to remain asleep. When persistent, significant weight loss is observed due to the complete loss of appetite and sleep deprivation.  Blood   pressure and heart rate becomes significantly elevated. Caution: If elevated blood pressure triggers a pounding headache  associated with blurred vision, then the patient should immediately seek attention at an urgent or emergency care unit, as these may be signs of an impending stroke.    Emergency Department Pain Levels (6-10/10)  Emergency Room Pain 6 Severely limiting. Requires emergency care and should not be seen or managed at an outpatient pain management facility. Communication becomes difficult and requires great effort. Assistance to reach the emergency department may be required. Facial flushing and profuse sweating along with potentially dangerous increases in heart rate and blood pressure will be evident.   Distressing pain 7 Self-care is very difficult. Assistance is required to transport, or use restroom. Assistance to reach the emergency department will be required. Tasks requiring coordination, such as bathing and getting dressed become very difficult.   Disabling pain 8 Self-care is no longer possible. At this level, pain is disabling. The individual is unable to do even the most "basic" activities such as walking, eating, bathing, dressing, transferring to a bed, or toileting. Fine motor skills are lost. It is difficult to think clearly.   Incapacitating pain 9 Pain becomes incapacitating. Thought processing is no longer possible. Difficult to remember your own name. Control of movement and coordination are lost.   The worst pain imaginable 10 At this level, most patients pass out from pain. When this level is reached, collapse of the autonomic nervous system occurs, leading to a sudden drop in blood pressure and heart rate. This in turn results in a temporary and dramatic drop in blood flow to the brain, leading to a loss of consciousness. Fainting is one of the body's self defense mechanisms. Passing out puts the brain in a calmed state and causes it to shut down for a while, in order to begin the healing process.    Summary: 1. Refer to this scale when providing us with your pain level. 2. Be  accurate and careful when reporting your pain level. This will help with your care. 3. Over-reporting your pain level will lead to loss of credibility. 4. Even a level of 1/10 means that there is pain and will be treated at our facility. 5. High, inaccurate reporting will be documented as "Symptom Exaggeration", leading to loss of credibility and suspicions of possible secondary gains such as obtaining more narcotics, or wanting to appear disabled, for fraudulent reasons. 6. Only pain levels of 5 or below will be seen at our facility. 7. Pain levels of 6 and above will be sent to the Emergency Department and the appointment cancelled. ____________________________________________________________________________________________   ____________________________________________________________________________________________  Preparing for Procedure with Sedation  Instructions: . Oral Intake: Do not eat or drink anything for at least 8 hours prior to your procedure. . Transportation: Public transportation is not allowed. Bring an adult driver. The driver must be physically present in our waiting room before any procedure can be started. . Physical Assistance: Bring an adult physically capable of assisting you, in the event you need help. This adult should keep you company at home for at least 6 hours after the procedure. . Blood Pressure Medicine: Take your blood pressure medicine with a sip of water the morning of the procedure. . Blood thinners: Notify our staff if you are taking any blood thinners. Depending on which one you take, there will be specific instructions on how and when to stop it. . Diabetics on insulin: Notify the staff so that you can be   scheduled 1st case in the morning. If your diabetes requires high dose insulin, take only  of your normal insulin dose the morning of the procedure and notify the staff that you have done so. . Preventing infections: Shower with an antibacterial soap  the morning of your procedure. . Build-up your immune system: Take 1000 mg of Vitamin C with every meal (3 times a day) the day prior to your procedure. Marland Kitchen Antibiotics: Inform the staff if you have a condition or reason that requires you to take antibiotics before dental procedures. . Pregnancy: If you are pregnant, call and cancel the procedure. . Sickness: If you have a cold, fever, or any active infections, call and cancel the procedure. . Arrival: You must be in the facility at least 30 minutes prior to your scheduled procedure. . Children: Do not bring children with you. . Dress appropriately: Bring dark clothing that you would not mind if they get stained. . Valuables: Do not bring any jewelry or valuables.  Procedure appointments are reserved for interventional treatments only. Marland Kitchen No Prescription Refills. . No medication changes will be discussed during procedure appointments. . No disability issues will be discussed.  Reasons to call and reschedule or cancel your procedure: (Following these recommendations will minimize the risk of a serious complication.) . Surgeries: Avoid having procedures within 2 weeks of any surgery. (Avoid for 2 weeks before or after any surgery). . Flu Shots: Avoid having procedures within 2 weeks of a flu shots or . (Avoid for 2 weeks before or after immunizations). . Barium: Avoid having a procedure within 7-10 days after having had a radiological study involving the use of radiological contrast. (Myelograms, Barium swallow or enema study). . Heart attacks: Avoid any elective procedures or surgeries for the initial 6 months after a "Myocardial Infarction" (Heart Attack). . Blood thinners: It is imperative that you stop these medications before procedures. Let us know if you if you take any blood thinner.  . Infection: Avoid procedures during or within two weeks of an infection (including chest colds or gastrointestinal problems). Symptoms associated with  infections include: Localized redness, fever, chills, night sweats or profuse sweating, burning sensation when voiding, cough, congestion, stuffiness, runny nose, sore throat, diarrhea, nausea, vomiting, cold or Flu symptoms, recent or current infections. It is specially important if the infection is over the area that we intend to treat. Marland Kitchen Heart and lung problems: Symptoms that may suggest an active cardiopulmonary problem include: cough, chest pain, breathing difficulties or shortness of breath, dizziness, ankle swelling, uncontrolled high or unusually low blood pressure, and/or palpitations. If you are experiencing any of these symptoms, cancel your procedure and contact your primary care physician for an evaluation.  Remember:  Regular Business hours are:  Monday to Thursday 8:00 AM to 4:00 PM  Provider's Schedule: Milinda Pointer, MD:  Procedure days: Tuesday and Thursday 7:30 AM to 4:00 PM  Gillis Santa, MD:  Procedure days: Monday and Wednesday 7:30 AM to 4:00 PM ____________________________________________________________________________________________   Radiofrequency Lesioning Radiofrequency lesioning is a procedure that is performed to relieve pain. The procedure is often used for back, neck, or arm pain. Radiofrequency lesioning involves the use of a machine that creates radio waves to make heat. During the procedure, the heat is applied to the nerve that carries the pain signal. The heat damages the nerve and interferes with the pain signal. Pain relief usually starts about 2 weeks after the procedure and lasts for 6 months to 1 year. Tell a health  care provider about:  Any allergies you have.  All medicines you are taking, including vitamins, herbs, eye drops, creams, and over-the-counter medicines.  Any problems you or family members have had with anesthetic medicines.  Any blood disorders you have.  Any surgeries you have had.  Any medical conditions you  have.  Whether you are pregnant or may be pregnant. What are the risks? Generally, this is a safe procedure. However, problems may occur, including:  Pain or soreness at the injection site.  Infection at the injection site.  Damage to nerves or blood vessels.  What happens before the procedure?  Ask your health care provider about: ? Changing or stopping your regular medicines. This is especially important if you are taking diabetes medicines or blood thinners. ? Taking medicines such as aspirin and ibuprofen. These medicines can thin your blood. Do not take these medicines before your procedure if your health care provider instructs you not to.  Follow instructions from your health care provider about eating or drinking restrictions.  Plan to have someone take you home after the procedure.  If you go home right after the procedure, plan to have someone with you for 24 hours. What happens during the procedure?  You will be given one or more of the following: ? A medicine to help you relax (sedative). ? A medicine to numb the area (local anesthetic).  You will be awake during the procedure. You will need to be able to talk with the health care provider during the procedure.  With the help of a type of X-ray (fluoroscopy), the health care provider will insert a radiofrequency needle into the area to be treated.  Next, a wire that carries the radio waves (electrode) will be put through the radiofrequency needle. An electrical pulse will be sent through the electrode to verify the correct nerve. You will feel a tingling sensation, and you may have muscle twitching.  Then, the tissue that is around the needle tip will be heated by an electric current that is passed using the radiofrequency machine. This will numb the nerves.  A bandage (dressing) will be put on the insertion area after the procedure is done. The procedure may vary among health care providers and hospitals. What  happens after the procedure?  Your blood pressure, heart rate, breathing rate, and blood oxygen level will be monitored often until the medicines you were given have worn off.  Return to your normal activities as directed by your health care provider. This information is not intended to replace advice given to you by your health care provider. Make sure you discuss any questions you have with your health care provider. Document Released: 01/09/2011 Document Revised: 10/19/2015 Document Reviewed: 06/20/2014 Elsevier Interactive Patient Education  [employer].

## 2017-12-29 NOTE — Progress Notes (Signed)
Safety precautions to be maintained throughout the outpatient stay will include: orient to surroundings, keep bed in low position, maintain call bell within reach at all times, provide assistance with transfer out of bed and ambulation.  

## 2018-01-01 IMAGING — CR DG THORACIC SPINE 3V
3 series · 3 of 3 positions shown · non-contrast
Comparison: Chest radiograph 01/23/2016

CLINICAL DATA: Fell 3 days ago, mid back pain, history
osteoporosis, osteoarthritis

EXAM:
THORACIC SPINE - 3 VIEWS

[t t-spine a.p.]
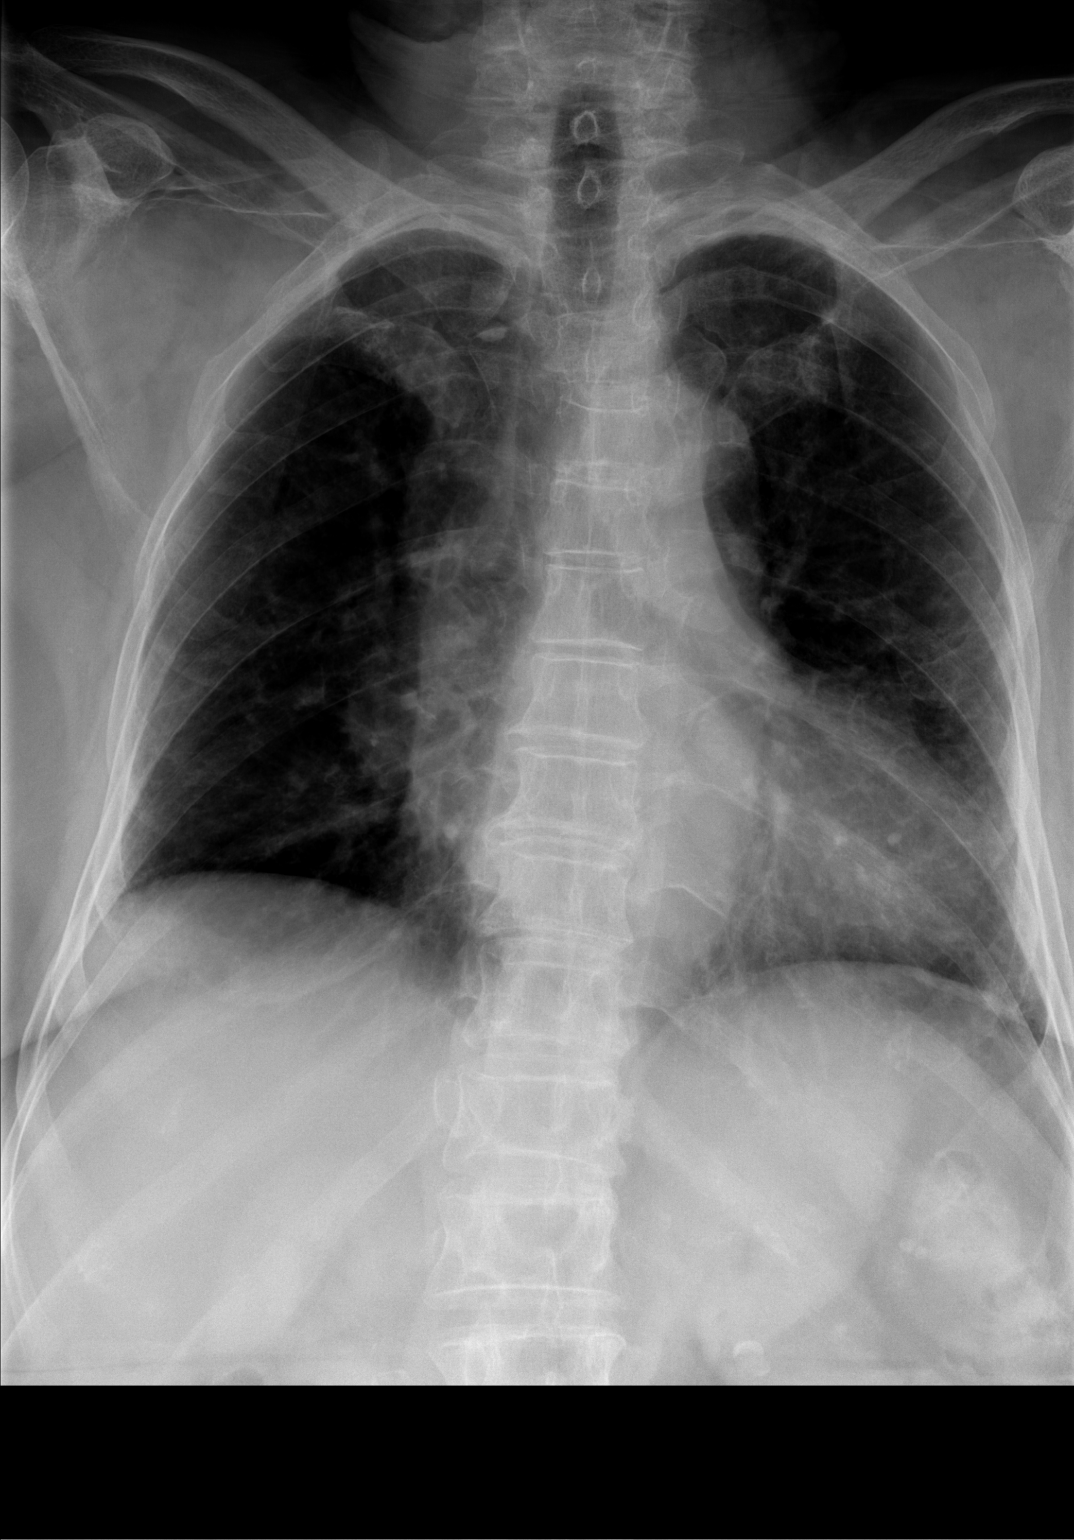

[t t-spine lat *]
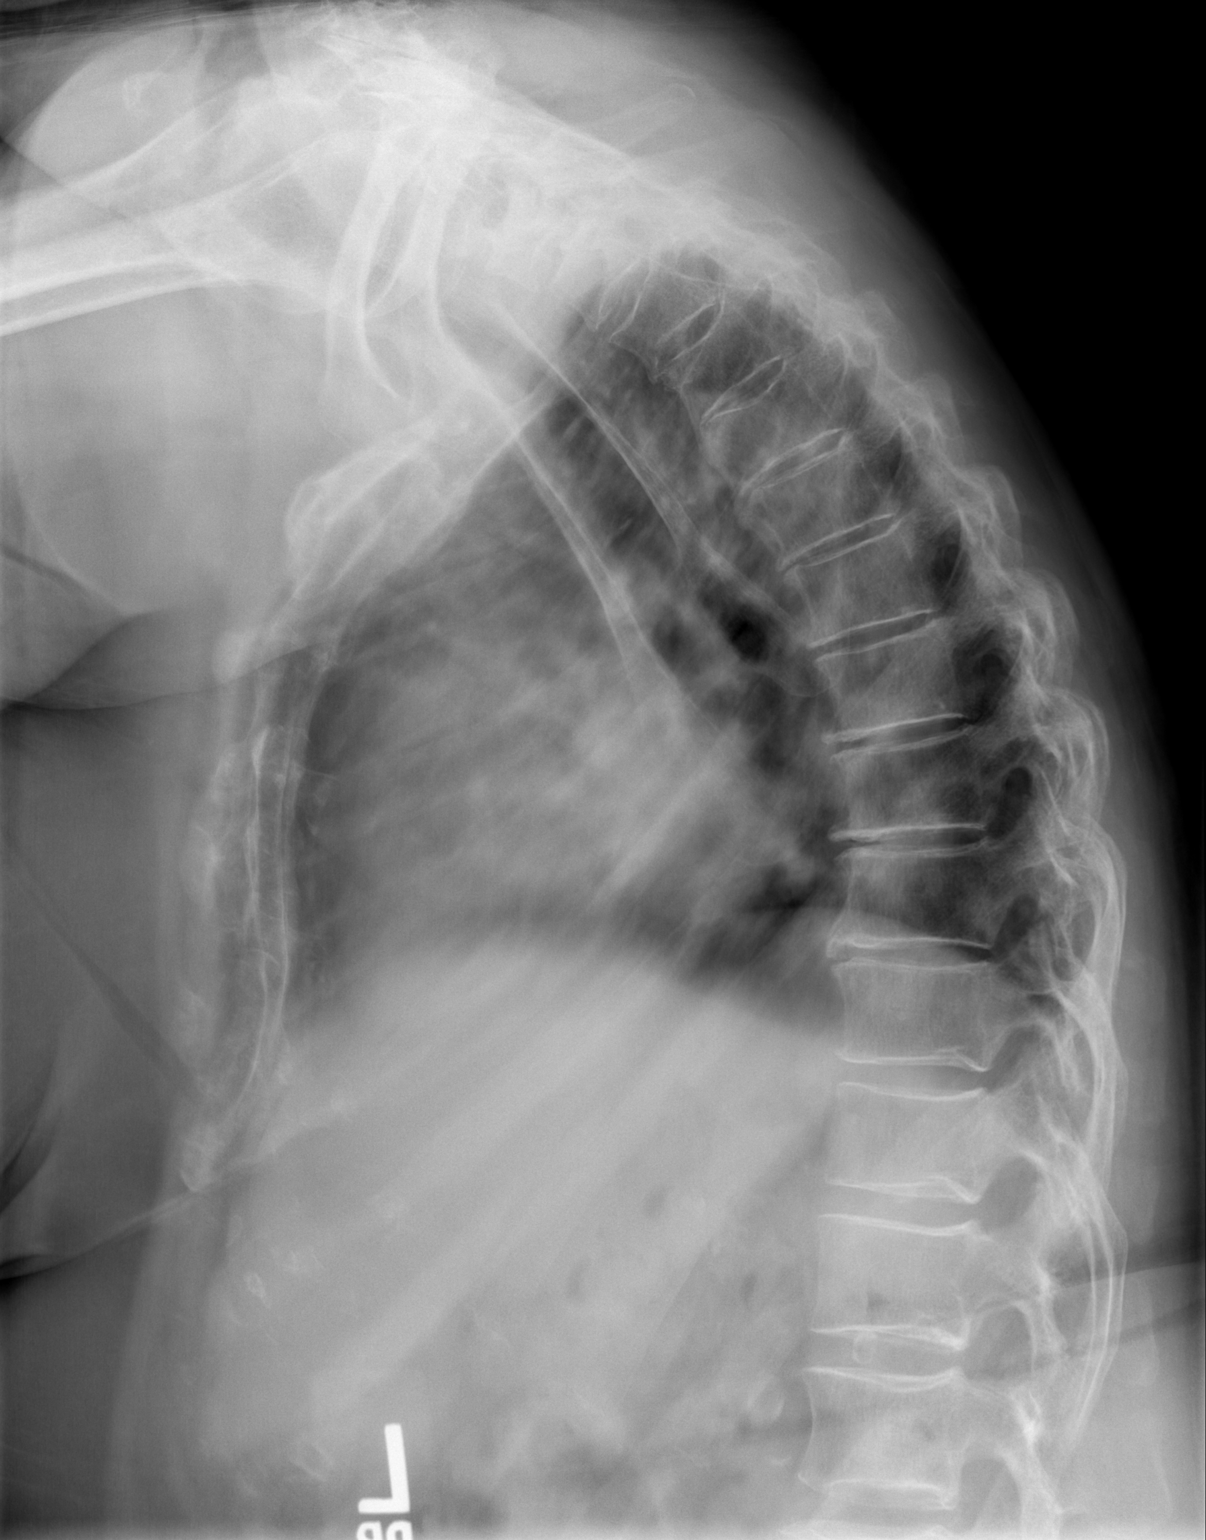

[t swimmers]
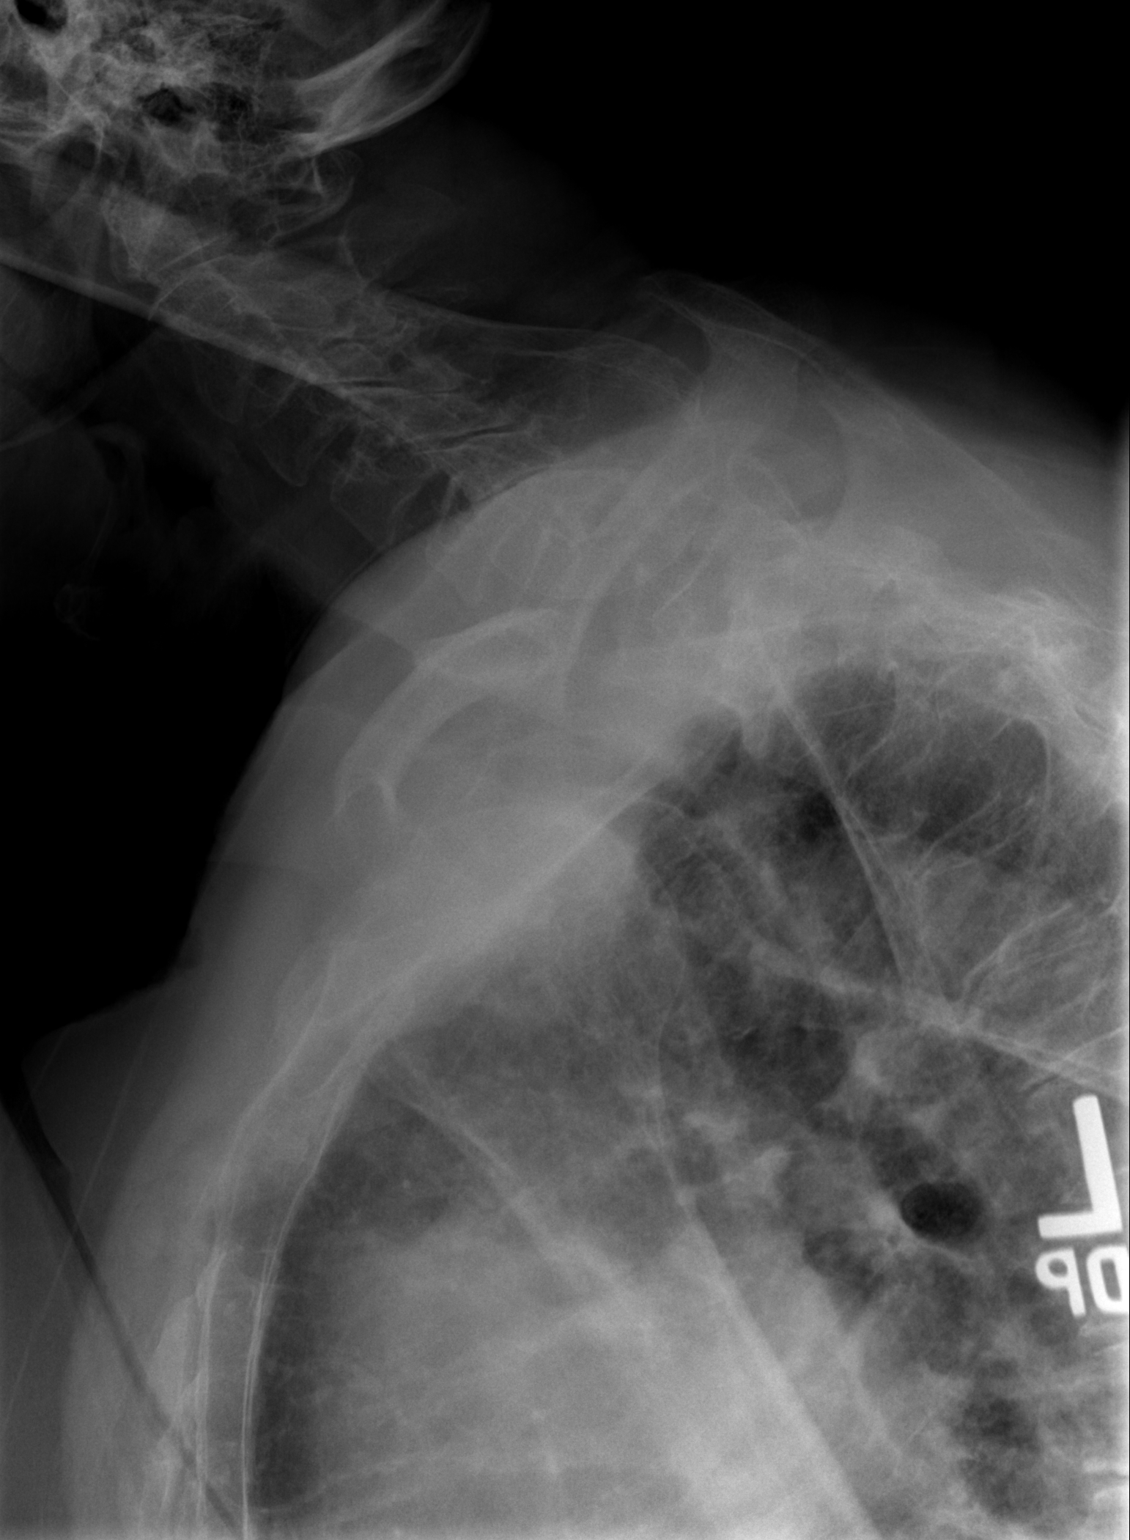

[3 of 3 positions shown; findings below may reference images not displayed]

FINDINGS: Osseous demineralization.

Twelve pairs of ribs, 12th hypoplastic.

Minimal biconvex scoliosis.

Mild chronic superior endplate height loss of T2 vertebral body.

Minimal superior endplate concavities at T3 and T4, also chronic.

No acute fracture, subluxation, or bone destruction.
IMPRESSION: Osseous mineralization.

Chronic superior endplate changes at T2-T4 as above.

No acute abnormalities.

## 2018-01-08 ENCOUNTER — Telehealth: Payer: Self-pay

## 2018-01-08 NOTE — Telephone Encounter (Signed)
Pt's daughter called and states her mom cant hardly walk, wondering if they should go to ER, please call back with any suggestions

## 2018-01-08 NOTE — Telephone Encounter (Signed)
Talked with patients daughter and states that [name] is having lots of pain and having difficulty walking.  Wondering if she should go to the  ED for evaluation.  Told her this was an option if she felt like she needed to do that but we would be glad to see her here for evaluation this afternoon.  She was wondering if the pain medication could be increased as she was only taking it bid.  When the sig was checked for oxycodone 5 mg the sig was q8hrs prn pain.  Clarification of medication instructions understood.  Patient will alternate heat and ice, take medications as directed and elevate the affected leg with appropriate support to the knee.  Will call if things get worse.

## 2018-01-18 IMAGING — CT CT ANGIO CHEST-ABD-PELV FOR DISSECTION W/ AND WO/W CM
2 of 7 series · 14 of 46 positions shown, 16 images · IV contrast (isovue)
Comparison: None.

CLINICAL DATA: Chest pain last night with severe abdominal pain
today. Concern for dissection. History of COPD, bladder sling,
ostomy for accidental bowel perforation.

EXAM:
CT ANGIOGRAPHY CHEST, ABDOMEN AND PELVIS
TECHNIQUE: Multidetector CT imaging through the chest, abdomen and pelvis was
performed using the standard protocol during bolus administration of
intravenous contrast. Multiplanar reconstructed images and MIPs were
obtained and reviewed to evaluate the vascular anatomy.
CONTRAST:  100 mL Isovue 370

[Series 6: axial arterial · axial · arterial · 0.72mm/px · z∈[-611,-74]mm · 11 of 199 slices shown, 13 images]
[im 10/199  soft-tissue]
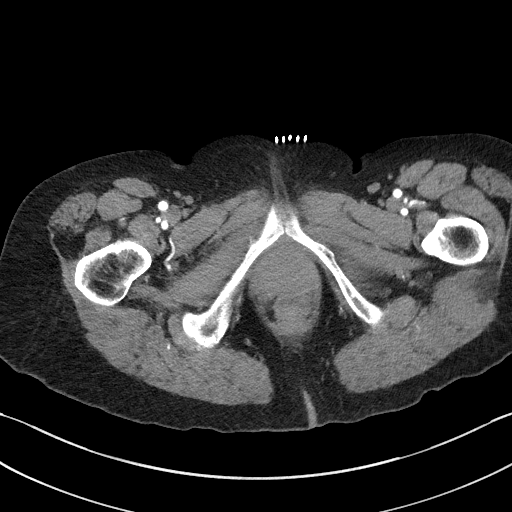
[im 10/199  bone]
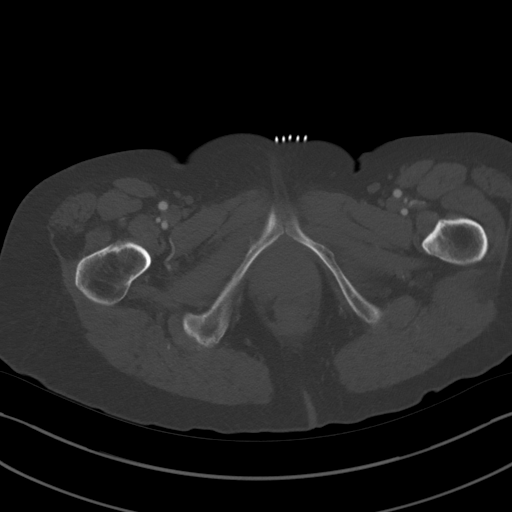
[im 30/199  soft-tissue]
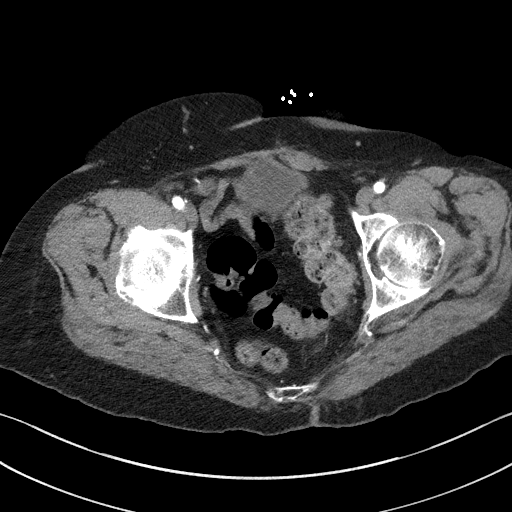
[im 50/199  soft-tissue]
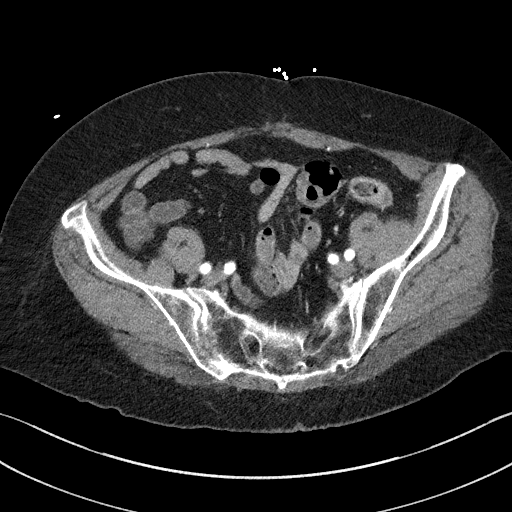
[im 70/199  soft-tissue]
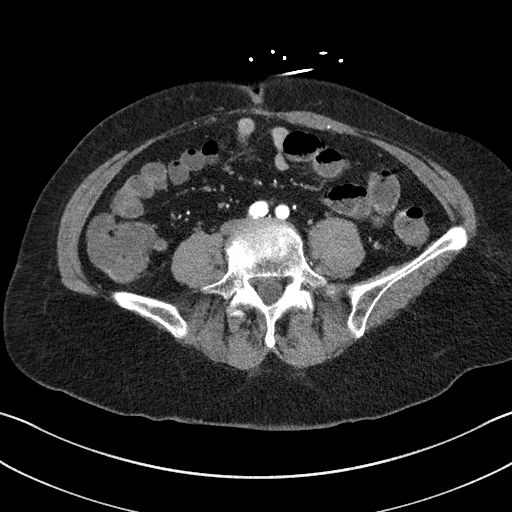
[im 80/199  soft-tissue]
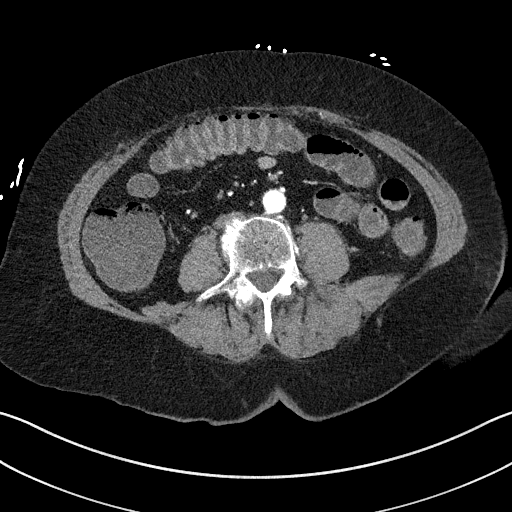
[im 100/199  soft-tissue]
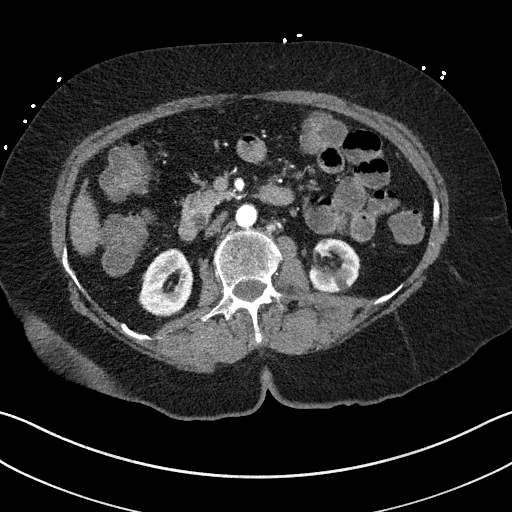
[im 119/199  soft-tissue]
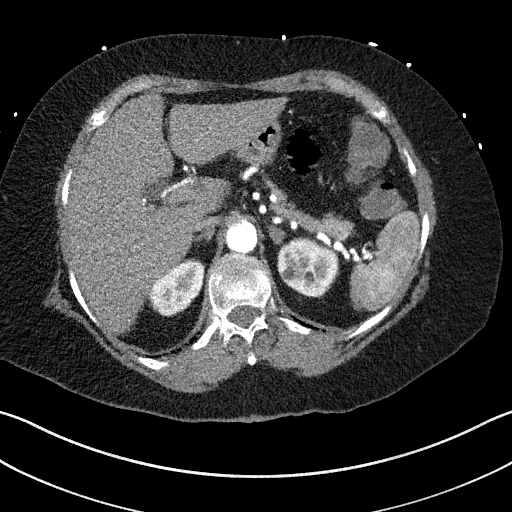
[im 129/199  soft-tissue]
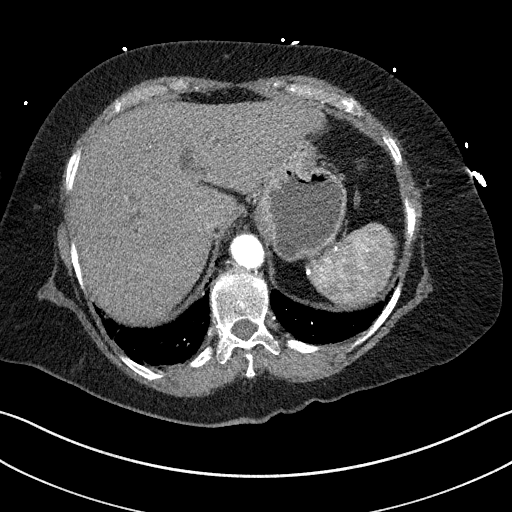
[im 149/199  soft-tissue]
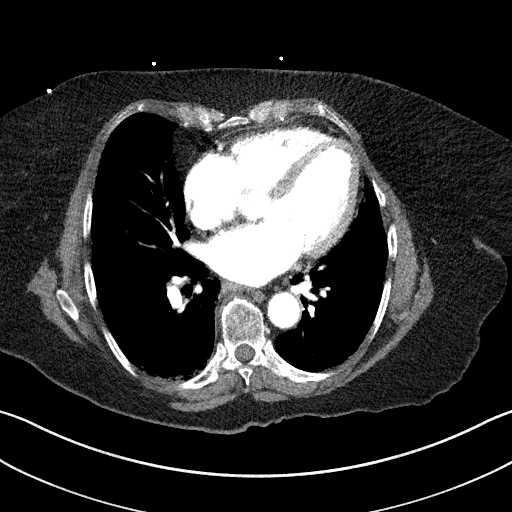
[im 149/199  bone]
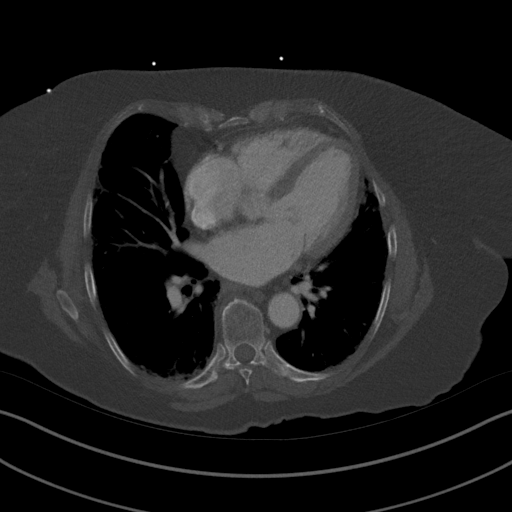
[im 169/199  soft-tissue]
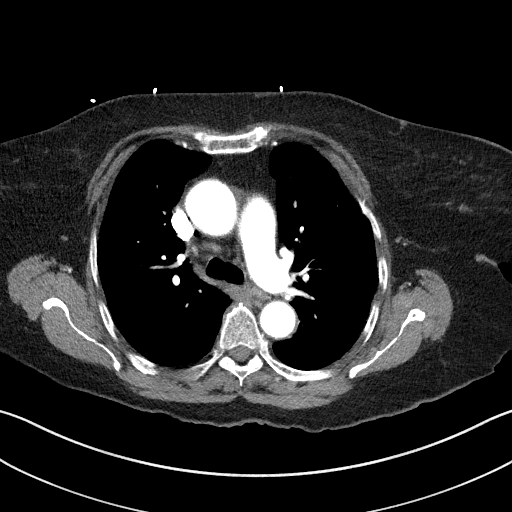
[im 189/199  soft-tissue]
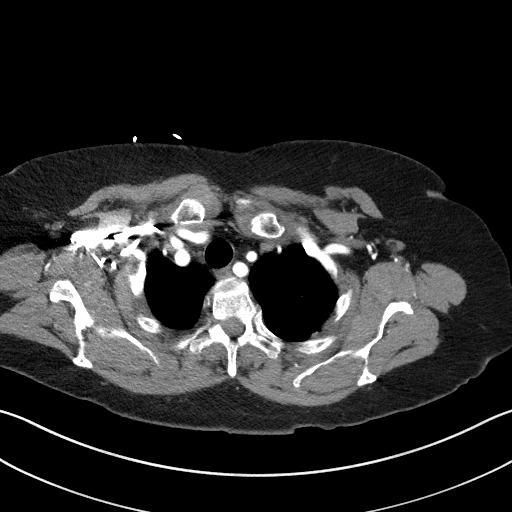

[Series 8: coronals · coronal · 0.77mm/px · 3 of 132 slices shown]
[im 33/132  soft-tissue]
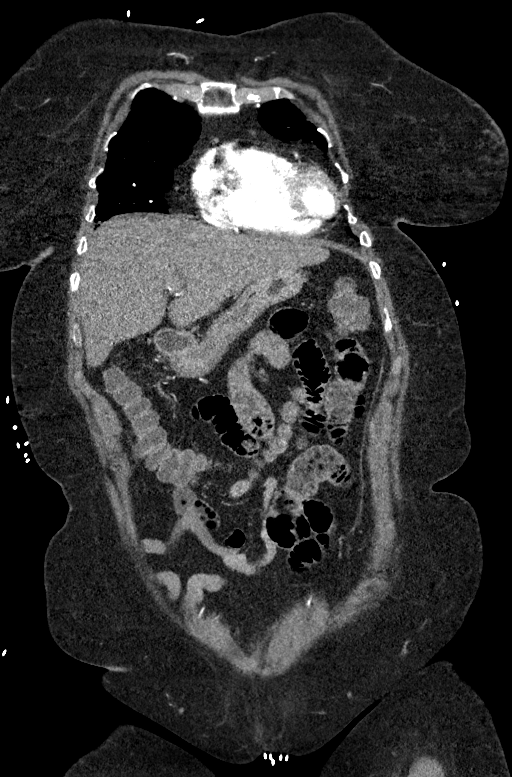
[im 66/132  soft-tissue]
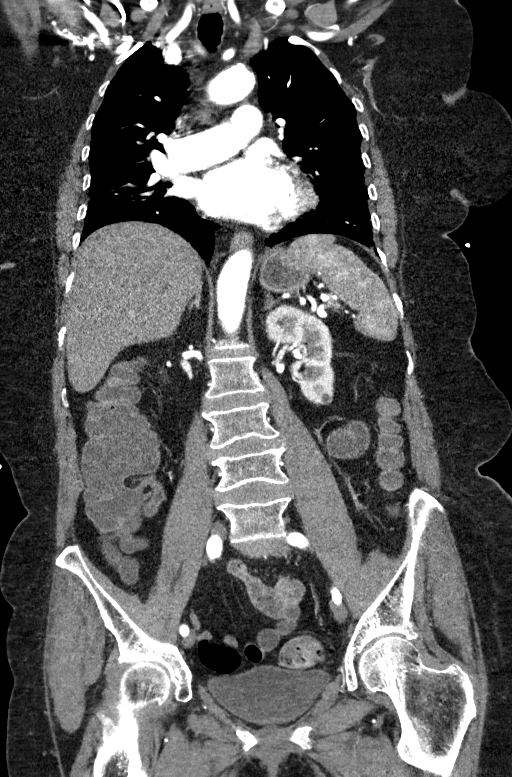
[im 99/132  soft-tissue]
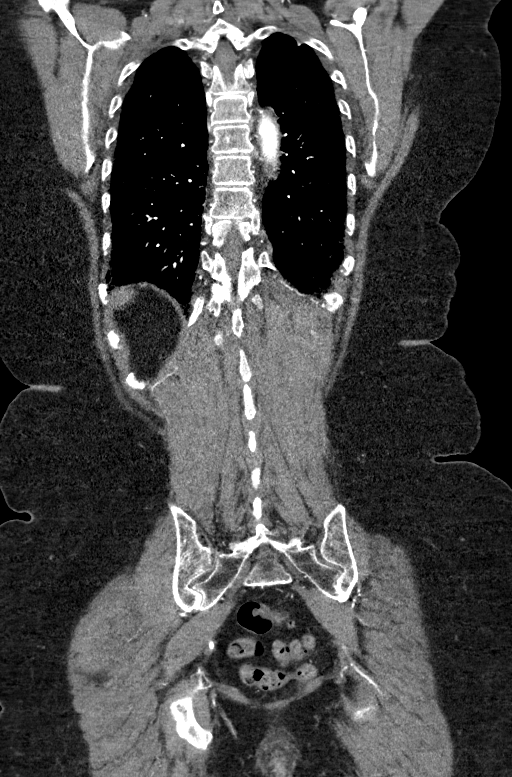

[14 of 46 positions shown; findings below may reference images not displayed]

FINDINGS: CTA CHEST FINDINGS

Cardiovascular: Unenhanced images of the chest demonstrate scattered
calcification of the aorta. No evidence of intramural hematoma.
Calcified granuloma in the left upper lung. Calcified hilar lymph
nodes. Calcified splenic granulomas.

Arterial phase contrast-enhanced images of the chest demonstrate
normal caliber thoracic aorta. No evidence of aortic dissection.
Great vessel origins are patent. Normal heart size. No pericardial
effusion. Visualized pulmonary arteries are well opacified without
evidence of pulmonary embolus.

Mediastinum/Nodes: No enlarged mediastinal, hilar, or axillary lymph
nodes. Thyroid gland, trachea, and esophagus demonstrate no
significant findings.

Lungs/Pleura: Evaluation is limited due to motion artifact.
Emphysematous changes in the lungs with peripheral fibrosis. No
focal consolidation. Airways are patent. No pleural effusions. No
pneumothorax.

Musculoskeletal: Degenerative changes in the spine. No destructive
bone lesions.

Review of the MIP images confirms the above findings.

CTA ABDOMEN AND PELVIS FINDINGS

VASCULAR

Aorta: Normal caliber aorta without aneurysm, dissection, vasculitis
or significant stenosis. Mild scattered calcifications.

Celiac: Patent without evidence of aneurysm, dissection, vasculitis
or significant stenosis.

SMA: Patent without evidence of aneurysm, dissection, vasculitis or
significant stenosis.

Renals: Both renal arteries are patent without evidence of aneurysm,
dissection, vasculitis, fibromuscular dysplasia or significant
stenosis.

IMA: Patent without evidence of aneurysm, dissection, vasculitis or
significant stenosis.

Inflow: Patent without evidence of aneurysm, dissection, vasculitis
or significant stenosis.

Veins: Inferior vena cava is flattened which may indicate
hypovolemia.

Review of the MIP images confirms the above findings.

NON-VASCULAR

Hepatobiliary: No focal liver abnormality is seen. Status post
cholecystectomy. No biliary dilatation.

Pancreas: Unremarkable. No pancreatic ductal dilatation or
surrounding inflammatory changes.

Spleen: Normal in size without focal abnormality.

Adrenals/Urinary Tract: Renal nephrograms are symmetrical. Sub cm
cysts in the kidneys. No hydronephrosis or hydroureter. Bladder wall
is not thickened. Left adrenal gland nodule measuring 10 mm
diameter.

Stomach/Bowel: Stomach is within normal limits. Appendix appears
normal. No evidence of bowel wall thickening, distention, or
inflammatory changes.

Lymphatic: No significant vascular findings are present. No enlarged
abdominal or pelvic lymph nodes.

Reproductive: Status post hysterectomy. No adnexal masses.

Other: Small ventral abdominal wall hernia below the umbilicus
containing fat. No free air or free fluid in the abdomen.

Musculoskeletal: Degenerative changes in the spine. No destructive
bone lesions.

Review of the MIP images confirms the above findings.
IMPRESSION: No evidence of thoracic or abdominal aortic dissection or aneurysm.
Calcified granulomas. Small ventral abdominal wall hernia containing
fat.

**An incidental finding of potential clinical significance has been
found. 1 cm diameter left adrenal gland nodule. Based on size
criteria, this is probably benign. Consider follow-up with adrenal
protocol CT in 1-2 years.**

## 2018-02-02 ENCOUNTER — Other Ambulatory Visit: Payer: Self-pay | Admitting: Pain Medicine

## 2018-02-02 ENCOUNTER — Telehealth: Payer: Self-pay | Admitting: Pain Medicine

## 2018-02-02 DIAGNOSIS — G8929 Other chronic pain: Secondary | ICD-10-CM

## 2018-02-02 DIAGNOSIS — M7918 Myalgia, other site: Secondary | ICD-10-CM

## 2018-02-02 DIAGNOSIS — M792 Neuralgia and neuritis, unspecified: Secondary | ICD-10-CM

## 2018-02-02 NOTE — Telephone Encounter (Signed)
Patient's daughter lvmail at 11:20 stating both she and [name] has a cold and dont feel they should come in for Knee Injections tomorrow. Also stated that patient needed increased meds for pain. Please call to let them know if they need to cancel. Patient has appt for med refill but they want increased meds.

## 2018-02-02 NOTE — Telephone Encounter (Signed)
States pain in knee is really bad, but has a cold and not sure if she will be able to come tomorrow for RF. Asking for increase in pain meds. Patient advised to cancel if she has fever, or feels sick. Ok to come if just sniffles. Also advise that Dr. Dossie Arbour does not do any medication management outside of med. Mgmt appointment. She will call tomorrow if she decides to cancel.

## 2018-02-03 ENCOUNTER — Encounter: Payer: Self-pay | Admitting: Pain Medicine

## 2018-02-03 ENCOUNTER — Other Ambulatory Visit: Payer: Self-pay

## 2018-02-03 ENCOUNTER — Ambulatory Visit (HOSPITAL_BASED_OUTPATIENT_CLINIC_OR_DEPARTMENT_OTHER): Payer: Medicare Other | Admitting: Pain Medicine

## 2018-02-03 ENCOUNTER — Ambulatory Visit
Admission: RE | Admit: 2018-02-03 | Discharge: 2018-02-03 | Disposition: A | Discharge status: Home / Self Care | Payer: Medicare Other | Source: Ambulatory Visit | Attending: Pain Medicine | Admitting: Pain Medicine

## 2018-02-03 VITALS — BP 128/98 | HR 67 | Temp 97.8°F | Resp 20 | Ht 64.0 in | Wt 175.0 lb

## 2018-02-03 DIAGNOSIS — M5136 Other intervertebral disc degeneration, lumbar region: Secondary | ICD-10-CM

## 2018-02-03 DIAGNOSIS — M25551 Pain in right hip: Secondary | ICD-10-CM | POA: Diagnosis not present

## 2018-02-03 DIAGNOSIS — Z79899 Other long term (current) drug therapy: Secondary | ICD-10-CM | POA: Diagnosis not present

## 2018-02-03 DIAGNOSIS — G8929 Other chronic pain: Secondary | ICD-10-CM | POA: Insufficient documentation

## 2018-02-03 DIAGNOSIS — G8918 Other acute postprocedural pain: Secondary | ICD-10-CM

## 2018-02-03 DIAGNOSIS — M25561 Pain in right knee: Secondary | ICD-10-CM

## 2018-02-03 DIAGNOSIS — M1711 Unilateral primary osteoarthritis, right knee: Secondary | ICD-10-CM | POA: Insufficient documentation

## 2018-02-03 DIAGNOSIS — S72001S Fracture of unspecified part of neck of right femur, sequela: Secondary | ICD-10-CM | POA: Insufficient documentation

## 2018-02-03 DIAGNOSIS — M150 Primary generalized (osteo)arthritis: Secondary | ICD-10-CM

## 2018-02-03 DIAGNOSIS — Z882 Allergy status to sulfonamides status: Secondary | ICD-10-CM | POA: Insufficient documentation

## 2018-02-03 DIAGNOSIS — M8949 Other hypertrophic osteoarthropathy, multiple sites: Secondary | ICD-10-CM

## 2018-02-03 DIAGNOSIS — Z96651 Presence of right artificial knee joint: Secondary | ICD-10-CM | POA: Diagnosis not present

## 2018-02-03 DIAGNOSIS — Z888 Allergy status to other drugs, medicaments and biological substances status: Secondary | ICD-10-CM | POA: Insufficient documentation

## 2018-02-03 DIAGNOSIS — Z880 Allergy status to penicillin: Secondary | ICD-10-CM | POA: Diagnosis not present

## 2018-02-03 DIAGNOSIS — Z881 Allergy status to other antibiotic agents status: Secondary | ICD-10-CM | POA: Diagnosis not present

## 2018-02-03 DIAGNOSIS — S72141S Displaced intertrochanteric fracture of right femur, sequela: Secondary | ICD-10-CM

## 2018-02-03 DIAGNOSIS — Z96653 Presence of artificial knee joint, bilateral: Secondary | ICD-10-CM

## 2018-02-03 DIAGNOSIS — M159 Polyosteoarthritis, unspecified: Secondary | ICD-10-CM

## 2018-02-03 DIAGNOSIS — Z96641 Presence of right artificial hip joint: Secondary | ICD-10-CM

## 2018-02-03 DIAGNOSIS — M51369 Other intervertebral disc degeneration, lumbar region without mention of lumbar back pain or lower extremity pain: Secondary | ICD-10-CM

## 2018-02-03 HISTORY — DX: Other acute postprocedural pain: G89.18

## 2018-02-03 MED ORDER — HYDROCODONE-ACETAMINOPHEN 5-325 MG PO TABS: 1.0000 | ORAL_TABLET | Freq: Three times a day (TID) | ORAL | 0 refills | Status: AC | PRN

## 2018-02-03 MED ORDER — MIDAZOLAM HCL 5 MG/5ML IJ SOLN
1.0000 mg | INTRAMUSCULAR | Status: DC | PRN
  Administered 2018-02-03: 2 mg via INTRAVENOUS
  Filled 2018-02-03: qty 5

## 2018-02-03 MED ORDER — LACTATED RINGERS IV SOLN
1000.0000 mL | Freq: Once | INTRAVENOUS | Status: AC
  Administered 2018-02-03: 1000 mL via INTRAVENOUS

## 2018-02-03 MED ORDER — FENTANYL CITRATE (PF) 100 MCG/2ML IJ SOLN
25.0000 ug | INTRAMUSCULAR | Status: DC | PRN
  Administered 2018-02-03: 100 ug via INTRAVENOUS
  Filled 2018-02-03: qty 2

## 2018-02-03 MED ORDER — ROPIVACAINE HCL 2 MG/ML IJ SOLN
9.0000 mL | Freq: Once | INTRAMUSCULAR | Status: AC
  Administered 2018-02-03: 10 mL
  Filled 2018-02-03: qty 10

## 2018-02-03 MED ORDER — LIDOCAINE HCL 2 % IJ SOLN
20.0000 mL | Freq: Once | INTRAMUSCULAR | Status: AC
  Administered 2018-02-03: 400 mg
  Filled 2018-02-03: qty 20

## 2018-02-03 MED ORDER — METHYLPREDNISOLONE ACETATE 80 MG/ML IJ SUSP
80.0000 mg | Freq: Once | INTRAMUSCULAR | Status: AC
  Administered 2018-02-03: 80 mg
  Filled 2018-02-03: qty 1

## 2018-02-03 MED ORDER — HYDROCODONE-ACETAMINOPHEN 5-325 MG PO TABS: 1.0000 | ORAL_TABLET | Freq: Three times a day (TID) | ORAL | 0 refills | Status: DC | PRN

## 2018-02-03 NOTE — Progress Notes (Signed)
Safety precautions to be maintained throughout the outpatient stay will include: orient to surroundings, keep bed in low position, maintain call bell within reach at all times, provide assistance with transfer out of bed and ambulation.  

## 2018-02-03 NOTE — Progress Notes (Signed)
Patient's Name: [name]  MRN: [id number]  Referring Provider: Milinda Pointer, MD  DOB: [birth year]  PCP: Wenda Low, MD  DOS: 02/03/2018  Note by: Gaspar Cola, MD  Service setting: Ambulatory outpatient  Specialty: Interventional Pain Management  Patient type: Established  Location: [facility] (AMB) Pain Management Facility  Visit type: Interventional Procedure   Primary Reason for Visit: Interventional Pain Management Treatment. CC: Hip Pain (right) and Knee Pain (right)  Procedure:          Anesthesia, Analgesia, Anxiolysis:  Type: Therapeutic Superior-lateral, Superior-medial, and Inferior-medial, Genicular Nerve Radiofrequency Ablation #1  Region: Lateral, Anterior, and Medial aspects of the knee joint, above and below the knee joint proper. Level: Superior and inferior to the knee joint. Laterality: Right  Type: Moderate (Conscious) Sedation combined with Local Anesthesia Indication(s): Analgesia and Anxiety Route: Intravenous (IV) IV Access: Secured Sedation: Meaningful verbal contact was maintained at all times during the procedure  Local Anesthetic: Lidocaine 1-2%   Indications: 1. 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 Chronic knee pain (Primary Area of Pain) (Right)   3. Osteoarthritis    [name] has been dealing with the above chronic pain for longer than three months and has either failed to respond, was unable to tolerate, or simply did not get enough benefit from other more conservative therapies including, but not limited to: 1. Over-the-counter medications 2. Anti-inflammatory medications 3. Muscle relaxants 4. Membrane stabilizers 5. Opioids 6. Physical therapy 7. Modalities (Heat, ice, etc.) 8. Invasive techniques such as nerve blocks. [name] has attained more than 50% relief of the pain from a series of diagnostic injections conducted in separate occasions.  Pain Score: Pre-procedure: 7 /10 Post-procedure: 5  /10  Pre-op Assessment:  [name] is a 77 y.o. (year old), female patient, seen today for interventional treatment. 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 [name] has a current medication list which includes the following prescription(s): albuterol, albuterol, alendronate, amlodipine, calcium-phosphorus-vitamin d, citalopram, docusate sodium, esomeprazole, iron, ginkgo biloba, lactose free nutrition, linaclotide, lovastatin, meloxicam, menthol (topical analgesic), multiple vitamins-minerals, ondansetron, orphenadrine, polyethylene glycol, probiotic product, senna, trazodone, hydrocodone-acetaminophen, hydrocodone-acetaminophen, oxycodone, and pregabalin, and the following Facility-Administered Medications: fentanyl, lactated ringers, and midazolam. Her primarily concern today is the Hip Pain (right) and Knee Pain (right)  Initial Vital Signs:  Pulse/HCG Rate: 67ECG Heart Rate: (!) 59 Temp: 98.6 F (37 C) Resp: 20 BP: (!) 146/97 SpO2: 97 %  BMI: Estimated body mass index is 30.04 kg/m as calculated from the following:   Height as of this encounter: 5\' 4"  (1.626 m).   Weight as of this encounter: 175 lb (79.4 kg).  Risk Assessment: Allergies: Reviewed. She is allergic to ampicillin; ampicillin-sulbactam sodium; ambien [zolpidem tartrate]; flexeril [cyclobenzaprine]; tape; toradol [ketorolac tromethamine]; sulfamethoxazole-trimethoprim; cephalexin; and tramadol.  Allergy Precautions: None required Coagulopathies: Reviewed. None identified.  Blood-thinner therapy: None at this time Active Infection(s): Reviewed. None  identified. [name] is afebrile  Site Confirmation: [name] was asked to confirm the procedure and laterality before marking the site Procedure checklist: Completed Consent: Before the procedure and under the influence of no sedative(s), amnesic(s), or anxiolytics, the patient was informed of the treatment options, risks and possible complications. To fulfill  our ethical and legal obligations, as recommended by the American Medical Association's Code of Ethics, I have informed the patient of my clinical impression; the nature and purpose of the treatment or procedure; the risks, benefits, and possible complications of the intervention; the alternatives, including doing nothing; the risk(s) and benefit(s) of the alternative treatment(s) or procedure(s); and the risk(s) and benefit(s) of doing nothing. The patient was provided information about the general risks and possible complications associated with the procedure. These may include, but are not limited to: failure to achieve desired goals, infection, bleeding, organ or nerve damage, allergic reactions, paralysis, and death. In addition, the patient was informed of those risks and complications associated to the procedure, such as failure to decrease pain; infection; bleeding; organ or nerve damage with subsequent damage to sensory, motor, and/or autonomic systems, resulting in permanent pain, numbness, and/or weakness of one or several areas of the body; allergic reactions; (i.e.: anaphylactic reaction); and/or death. Furthermore, the patient was informed of those risks and complications associated with the medications. These include, but are not limited to: allergic reactions (i.e.: anaphylactic or anaphylactoid reaction(s)); adrenal axis suppression; blood sugar elevation that in diabetics may result in ketoacidosis or comma; water retention that in patients with history of congestive heart failure may result in shortness of breath, pulmonary  edema, and decompensation with resultant heart failure; weight gain; swelling or edema; medication-induced neural toxicity; particulate matter embolism and blood vessel occlusion with resultant organ, and/or nervous system infarction; and/or aseptic necrosis of one or more joints. Finally, the patient was informed that Medicine is not an exact science; therefore, there is also the possibility of unforeseen or unpredictable risks and/or possible complications that may result in a catastrophic outcome. The patient indicated having understood very clearly. We have given the patient no guarantees and we have made no promises. Enough time was given to the patient to ask questions, all of which were answered to the patient's satisfaction. Ms. Pearse has indicated that she wanted to continue with the procedure. Attestation: I, the ordering provider, attest that I have discussed with the patient the benefits, risks, side-effects, alternatives, likelihood of achieving goals, and potential problems during recovery for the procedure that I have provided informed consent. Date  Time: 02/03/2018 10:29 AM  Pre-Procedure Preparation:  Monitoring: As per clinic protocol. Respiration, ETCO2, SpO2, BP, heart rate and rhythm monitor placed and checked for adequate function Safety Precautions: Patient was assessed for positional comfort and pressure points before starting the procedure. Time-out: I initiated and conducted the "Time-out" before starting the procedure, as per protocol. The patient was asked to participate by confirming the accuracy of the "Time Out" information. Verification of the correct person, site, and procedure were performed and confirmed by me, the nursing staff, and the patient. "Time-out" conducted as per Joint Commission's Universal Protocol (UP.01.01.01). Time: 1143  Description of Procedure:          Position: Supine Target Area: For Genicular Nerve block(s), the targets are: the  superior-lateral genicular nerve, located in the lateral distal portion of the femoral shaft as it curves to form the lateral epicondyle, in the region of the distal femoral metaphysis; the superior-medial genicular nerve, located in the medial distal portion of the femoral shaft as it curves to form the medial epicondyle; and the inferior-medial genicular nerve, located in the medial, proximal portion of the tibial shaft, as it curves to form the medial epicondyle, in the region of the proximal tibial metaphysis. Approach: Anterior, ipsilateral approach. Area  Prepped: Entire knee area, from mid-thigh to mid-shin, lateral, anterior, and medial aspects. Prepping solution: Hibiclens (4.0% Chlorhexidine gluconate solution) Safety Precautions: Aspiration looking for blood return was conducted prior to all injections. At no point did we inject any substances, as a needle was being advanced. No attempts were made at seeking any paresthesias. Safe injection practices and needle disposal techniques used. Medications properly checked for expiration dates. SDV (single dose vial) medications used. Description of the Procedure: Protocol guidelines were followed. The patient was placed in position over the procedure table. The target area was identified and the area prepped in the usual manner. The skin and muscle were infiltrated with local anesthetic. Appropriate amount of time allowed to pass for local anesthetics to take effect. Radiofrequency needles were introduced to the target area using fluoroscopic guidance. Using the NeuroTherm NT1100 Radiofrequency Generator, sensory stimulation using 50 Hz was used to locate & identify the nerve, making sure that the needle was positioned such that there was no sensory stimulation below 0.3 V or above 0.7 V. Stimulation using 2 Hz was used to evaluate the motor component. Care was taken not to lesion any nerves that demonstrated motor stimulation of the lower extremities at an  output of less than 2.5 times that of the sensory threshold, or a maximum of 2.0 V. Once satisfactory placement of the needles was achieved, the numbing solution was slowly injected after negative aspiration. After waiting for at least 2 minutes, the ablation was performed at 80 degrees C for 60 seconds, using regular Radiofrequency settings. Once the procedure was completed, the needles were then removed and the area cleansed, making sure to leave some of the prepping solution back to take advantage of its long term bactericidal properties. Intra-operative Compliance: Compliant Vitals:   02/03/18 1205 02/03/18 1210 02/03/18 1216 02/03/18 1226  BP: (!) 162/75 (!) 141/75 (!) 135/102 (!) 164/87  Pulse:      Resp: 13 17 20 17   Temp:      TempSrc:      SpO2: 93% 99% 99% 99%  Weight:      Height:        Start Time: 1143 hrs. End Time: 1212 hrs. Materials & Medications:  Needle(s) Type: Teflon-coated, curved tip, Radiofrequency needle(s) Gauge: 22G Length: 10cm Medication(s): Please see orders for medications and dosing details.  Imaging Guidance (Non-Spinal):          Type of Imaging Technique: Fluoroscopy Guidance (Non-Spinal) Indication(s): Assistance in needle guidance and placement for procedures requiring needle placement in or near specific anatomical locations not easily accessible without such assistance. Exposure Time: Please see nurses notes. Contrast: Before injecting any contrast, we confirmed that the patient did not have an allergy to iodine, shellfish, or radiological contrast. Once satisfactory needle placement was completed at the desired level, radiological contrast was injected. Contrast injected under live fluoroscopy. No contrast complications. See chart for type and volume of contrast used. Fluoroscopic Guidance: I was personally present during the use of fluoroscopy. "Tunnel Vision Technique" used to obtain the best possible view of the target area. Parallax error  corrected before commencing the procedure. "Direction-depth-direction" technique used to introduce the needle under continuous pulsed fluoroscopy. Once target was reached, antero-posterior, oblique, and lateral fluoroscopic projection used confirm needle placement in all planes. Images permanently stored in EMR. Interpretation: I personally interpreted the imaging intraoperatively. Adequate needle placement confirmed in multiple planes. Appropriate spread of contrast into desired area was observed. No evidence of afferent or efferent intravascular uptake. Permanent images  saved into the patient's record.  Antibiotic Prophylaxis:   Anti-infectives (From admission, onward)   None     Indication(s): None identified  Post-operative Assessment:  Post-procedure Vital Signs:  Pulse/HCG Rate: 6764 Temp: 98.6 F (37 C) Resp: 17 BP: (!) 164/87 SpO2: 99 %  EBL: None  Complications: No immediate post-treatment complications observed by team, or reported by patient.  Note: The patient tolerated the entire procedure well. A repeat set of vitals were taken after the procedure and the patient was kept under observation following institutional policy, for this type of procedure. Post-procedural neurological assessment was performed, showing return to baseline, prior to discharge. The patient was provided with post-procedure discharge instructions, including a section on how to identify potential problems. Should any problems arise concerning this procedure, the patient was given instructions to immediately contact us, at any time, without hesitation. In any case, we plan to contact the patient by telephone for a follow-up status report regarding this interventional procedure.  Comments:  No additional relevant information.  Plan of Care   Interventional management options: Planned, scheduled, and/or pending:   1. Therapeutic right-sided genicular nerve RFA #1 under fluoroscopic guidance and IV  sedation (Today). 2. Diagnostic right Femoral + Obturator nerve block #1  under fluoroscopic guidance and IV sedation (Next)   Considering:   Diagnostic right-sided genicular nerve block #2 Possible right-sided genicular nerve RFA Diagnostic right Femoral + Obturator nerve block #1  Possible right Femoral + Obturator nerve RFA  Diagnostic left cervical epidural steroid injection Diagnostic bilateral cervical facet blocks Diagnostic bilateral intra-articular shoulder joint injection Diagnostic bilateral suprascapular nerve block Possible bilateral suprascapular nerve RFA Diagnostic right-sided lumbar facet block #3 Possible right-sided lumbar facet RFA Diagnostic right-sided sacroiliac joint block #3  Possible right-sided sacroiliac joint RFA    Palliative PRN treatment(s):   None at this time   Imaging Orders     DG C-Arm 1-60 Min-No Report  Procedure Orders     Radiofrequency,Genicular     FEMORAL NERVE BLOCK     NERVE BLOCK  Medications ordered for procedure: Meds ordered this encounter  Medications  . lidocaine (XYLOCAINE) 2 % (with pres) injection 400 mg  . midazolam (VERSED) 5 MG/5ML injection 1-2 mg    Make sure Flumazenil is available in the pyxis when using this medication. If oversedation occurs, administer 0.2 mg IV over 15 sec. If after 45 sec no response, administer 0.2 mg again over 1 min; may repeat at 1 min intervals; not to exceed 4 doses (1 mg)  . fentaNYL (SUBLIMAZE) injection 25-50 mcg    Make sure Narcan is available in the pyxis when using this medication. In the event of respiratory depression (RR< 8/min): Titrate NARCAN (naloxone) in increments of 0.1 to 0.2 mg IV at 2-3 minute intervals, until desired degree of reversal.  . lactated ringers infusion 1,000 mL  . ropivacaine (PF) 2 mg/mL (0.2%) (NAROPIN) injection 9 mL  . methylPREDNISolone acetate (DEPO-MEDROL) injection 80 mg  . HYDROcodone-acetaminophen (NORCO/VICODIN) 5-325 MG tablet     Sig: Take 1 tablet by mouth every 8 (eight) hours as needed for up to 7 days for severe pain.    Dispense:  21 tablet    Refill:  0    For acute post-operative pain. Not to be refilled. To last 7 days.  Marland Kitchen HYDROcodone-acetaminophen (NORCO/VICODIN) 5-325 MG tablet    Sig: Take 1 tablet by mouth every 8 (eight) hours as needed for up to 7 days for severe pain.  Dispense:  21 tablet    Refill:  0    For acute post-operative pain. Not to be refilled. To last 7 days.   Medications administered: We administered lidocaine, midazolam, fentaNYL, lactated ringers, ropivacaine (PF) 2 mg/mL (0.2%), and methylPREDNISolone acetate.  See the medical record for exact dosing, route, and time of administration.  New Prescriptions   HYDROCODONE-ACETAMINOPHEN (NORCO/VICODIN) 5-325 MG TABLET    Take 1 tablet by mouth every 8 (eight) hours as needed for up to 7 days for severe pain.   HYDROCODONE-ACETAMINOPHEN (NORCO/VICODIN) 5-325 MG TABLET    Take 1 tablet by mouth every 8 (eight) hours as needed for up to 7 days for severe pain.   Disposition: Discharge home  Discharge Date & Time: 02/03/2018; 1246 hrs.   Physician-requested Follow-up: Return for Post-RFA eval (6 wks), w/ Dionisio David, NP, PRN Procedure: (R) Femoral + (R) Obturator NB (w/ sedati.  Future Appointments  Date Time Provider [place]  03/09/2018 11:00 AM Vevelyn Francois, NP [facility] None  03/17/2018 11:15 AM Vevelyn Francois, NP [facility] None  04/29/2018  1:30 PM Penumalli, Earlean Polka, MD [facility] None   Primary Care Physician: Wenda Low, MD Location: [facility] Note by: Gaspar Cola, MD Date: 02/03/2018; Time: 12:36 PM  Disclaimer:  Medicine is not an exact science. The only guarantee in medicine is that nothing is guaranteed. It is important to note that the decision to proceed with this intervention was based on the information collected from the patient. The Data and conclusions were  drawn from the patient's questionnaire, the interview, and the physical examination. Because the information was provided in large part by the patient, it cannot be guaranteed that it has not been purposely or unconsciously manipulated. Every effort has been made to obtain as much relevant data as possible for this evaluation. It is important to note that the conclusions that lead to this procedure are derived in large part from the available data. Always take into account that the treatment will also be dependent on availability of resources and existing treatment guidelines, considered by other Pain Management Practitioners as being common knowledge and practice, at the time of the intervention. For Medico-Legal purposes, it is also important to point out that variation in procedural techniques and pharmacological choices are the acceptable norm. The indications, contraindications, technique, and results of the above procedure should only be interpreted and judged by a Board-Certified Interventional Pain Specialist with extensive familiarity and expertise in the same exact procedure and technique.

## 2018-02-03 NOTE — Patient Instructions (Addendum)
___________________________________________________________________________________________  Post-Radiofrequency (RF) Discharge Instructions  You have just completed a Radiofrequency Neurotomy.  The following instructions will provide you with information and guidelines for self-care upon discharge.  If at any time you have questions or concerns please call your physician. DO NOT DRIVE YOURSELF!!  Instructions:  Apply ice: Fill a plastic sandwich bag with crushed ice. Cover it with a small towel and apply to injection site. Apply for 15 minutes then remove x 15 minutes. Repeat sequence on day of procedure, until you go to bed. The purpose is to minimize swelling and discomfort after procedure.  Apply heat: Apply heat to procedure site starting the day following the procedure. The purpose is to treat any soreness and discomfort from the procedure.  Food intake: No eating limitations, unless stipulated above.  Nevertheless, if you have had sedation, you may experience some nausea.  In this case, it may be wise to wait at least two hours prior to resuming regular diet.  Physical activities: Keep activities to a minimum for the first 8 hours after the procedure. For the first 24 hours after the procedure, do not drive a motor vehicle,  Operate heavy machinery, power tools, or handle any weapons.  Consider walking with the use of an assistive device or accompanied by an adult for the first 24 hours.  Do not drink alcoholic beverages including beer.  Do not make any important decisions or sign any legal documents. Go home and rest today.  Resume activities tomorrow, as tolerated.  Use caution in moving about as you may experience mild leg weakness.  Use caution in cooking, use of household electrical appliances and climbing steps.  Driving: If you have received any sedation, you are not allowed to drive for 24 hours after your procedure.  Blood thinner: Restart your blood thinner 6 hours after your  procedure. (Only for those taking blood thinners)  Insulin: As soon as you can eat, you may resume your normal dosing schedule. (Only for those taking insulin)  Medications: May resume pre-procedure medications.  Do not take any drugs, other than what has been prescribed to you.  Infection prevention: Keep procedure site clean and dry.  Post-procedure Pain Diary: Extremely important that this be done correctly and accurately. Recorded information will be used to determine the next step in treatment.  Pain evaluated is that of treated area only. Do not include pain from an untreated area.  Complete every hour, on the hour, for the initial 8 hours. Set an alarm to help you do this part accurately.  Do not go to sleep and have it completed later. It will not be accurate.  Follow-up appointment: Keep your follow-up appointment after the procedure. Usually 2 weeks for most procedures. (6 weeks in the case of radiofrequency.) Bring you pain diary.   Expect:  From numbing medicine (AKA: Local Anesthetics): Numbness or decrease in pain.  Onset: Full effect within 15 minutes of injected.  Duration: It will depend on the type of local anesthetic used. On the average, 1 to 8 hours.   From steroids: Decrease in swelling or inflammation. Once inflammation is improved, relief of the pain will follow.  Onset of benefits: Depends on the amount of swelling present. The more swelling, the longer it will take for the benefits to be seen. In some cases, up to 10 days.  Duration: Steroids will stay in the system x 2 weeks. Duration of benefits will depend on multiple posibilities including persistent irritating factors.  From procedure: Some   discomfort is to be expected once the numbing medicine wears off. This should be minimal if ice and heat are applied as instructed.  Call if:  You experience numbness and weakness that gets worse with time, as opposed to wearing off.  He experience any unusual  bleeding, difficulty breathing, or loss of the ability to control your bowel and bladder. (This applies to Spinal procedures only)  You experience any redness, swelling, heat, red streaks, elevated temperature, fever, or any other signs of a possible infection.  Emergency Numbers:  Durning business hours (Monday - Thursday, 8:00 AM - 4:00 PM) (Friday, 9:00 AM - 12:00 Noon): [phone]  After hours: [phone] ____________________________________________________________________________________________   ____________________________________________________________________________________________  Preparing for Procedure with Sedation  Instructions: . Oral Intake: Do not eat or drink anything for at least 8 hours prior to your procedure. . Transportation: Public transportation is not allowed. Bring an adult driver. The driver must be physically present in our waiting room before any procedure can be started. . Physical Assistance: Bring an adult physically capable of assisting you, in the event you need help. This adult should keep you company at home for at least 6 hours after the procedure. . Blood Pressure Medicine: Take your blood pressure medicine with a sip of water the morning of the procedure. . Blood thinners: Notify our staff if you are taking any blood thinners. Depending on which one you take, there will be specific instructions on how and when to stop it. . Diabetics on insulin: Notify the staff so that you can be scheduled 1st case in the morning. If your diabetes requires high dose insulin, take only  of your normal insulin dose the morning of the procedure and notify the staff that you have done so. . Preventing infections: Shower with an antibacterial soap the morning of your procedure. . Build-up your immune system: Take 1000 mg of Vitamin C with every meal (3 times a day) the day prior to your procedure. . Antibiotics: Inform the staff if you have a condition or  reason that requires you to take antibiotics before dental procedures. . Pregnancy: If you are pregnant, call and cancel the procedure. . Sickness: If you have a cold, fever, or any active infections, call and cancel the procedure. . Arrival: You must be in the facility at least 30 minutes prior to your scheduled procedure. . Children: Do not bring children with you. . Dress appropriately: Bring dark clothing that you would not mind if they get stained. . Valuables: Do not bring any jewelry or valuables.  Procedure appointments are reserved for interventional treatments only. . No Prescription Refills. . No medication changes will be discussed during procedure appointments. . No disability issues will be discussed.  Reasons to call and reschedule or cancel your procedure: (Following these recommendations will minimize the risk of a serious complication.) . Surgeries: Avoid having procedures within 2 weeks of any surgery. (Avoid for 2 weeks before or after any surgery). . Flu Shots: Avoid having procedures within 2 weeks of a flu shots or . (Avoid for 2 weeks before or after immunizations). . Barium: Avoid having a procedure within 7-10 days after having had a radiological study involving the use of radiological contrast. (Myelograms, Barium swallow or enema study). . Heart attacks: Avoid any elective procedures or surgeries for the initial 6 months after a "Myocardial Infarction" (Heart Attack). . Blood thinners: It is imperative that you stop these medications before procedures. Let us know if you if you   take any blood thinner.  . Infection: Avoid procedures during or within two weeks of an infection (including chest colds or gastrointestinal problems). Symptoms associated with infections include: Localized redness, fever, chills, night sweats or profuse sweating, burning sensation when voiding, cough, congestion, stuffiness, runny nose, sore throat, diarrhea, nausea, vomiting, cold or Flu  symptoms, recent or current infections. It is specially important if the infection is over the area that we intend to treat. Marland Kitchen Heart and lung problems: Symptoms that may suggest an active cardiopulmonary problem include: cough, chest pain, breathing difficulties or shortness of breath, dizziness, ankle swelling, uncontrolled high or unusually low blood pressure, and/or palpitations. If you are experiencing any of these symptoms, cancel your procedure and contact your primary care physician for an evaluation.  Remember:  Regular Business hours are:  Monday to Thursday 8:00 AM to 4:00 PM  Provider's Schedule: Milinda Pointer, MD:  Procedure days: Tuesday and Thursday 7:30 AM to 4:00 PM  Gillis Santa, MD:  Procedure days: Monday and Wednesday 7:30 AM to 4:00 PM ____________________________________________________________________________________________   Radiofrequency Lesioning Radiofrequency lesioning is a procedure that is performed to relieve pain. The procedure is often used for back, neck, or arm pain. Radiofrequency lesioning involves the use of a machine that creates radio waves to make heat. During the procedure, the heat is applied to the nerve that carries the pain signal. The heat damages the nerve and interferes with the pain signal. Pain relief usually starts about 2 weeks after the procedure and lasts for 6 months to 1 year. Tell a health care provider about:  Any allergies you have.  All medicines you are taking, including vitamins, herbs, eye drops, creams, and over-the-counter medicines.  Any problems you or family members have had with anesthetic medicines.  Any blood disorders you have.  Any surgeries you have had.  Any medical conditions you have.  Whether you are pregnant or may be pregnant. What are the risks? Generally, this is a safe procedure. However, problems may occur, including:  Pain or soreness at the injection site.  Infection at the injection  site.  Damage to nerves or blood vessels.  What happens before the procedure?  Ask your health care provider about: ? Changing or stopping your regular medicines. This is especially important if you are taking diabetes medicines or blood thinners. ? Taking medicines such as aspirin and ibuprofen. These medicines can thin your blood. Do not take these medicines before your procedure if your health care provider instructs you not to.  Follow instructions from your health care provider about eating or drinking restrictions.  Plan to have someone take you home after the procedure.  If you go home right after the procedure, plan to have someone with you for 24 hours. What happens during the procedure?  You will be given one or more of the following: ? A medicine to help you relax (sedative). ? A medicine to numb the area (local anesthetic).  You will be awake during the procedure. You will need to be able to talk with the health care provider during the procedure.  With the help of a type of X-ray (fluoroscopy), the health care provider will insert a radiofrequency needle into the area to be treated.  Next, a wire that carries the radio waves (electrode) will be put through the radiofrequency needle. An electrical pulse will be sent through the electrode to verify the correct nerve. You will feel a tingling sensation, and you may have muscle twitching.  Then,  the tissue that is around the needle tip will be heated by an electric current that is passed using the radiofrequency machine. This will numb the nerves.  A bandage (dressing) will be put on the insertion area after the procedure is done. The procedure may vary among health care providers and hospitals. What happens after the procedure?  Your blood pressure, heart rate, breathing rate, and blood oxygen level will be monitored often until the medicines you were given have worn off.  Return to your normal activities as directed by  your health care provider. This information is not intended to replace advice given to you by your health care provider. Make sure you discuss any questions you have with your health care provider. Document Released: 01/09/2011 Document Revised: 10/19/2015 Document Reviewed: 06/20/2014 Elsevier Interactive Patient Education  2018 [employer]. Pain Management Discharge Instructions  General Discharge Instructions :  If you need to reach your doctor call: Monday-Friday 8:00 am - 4:00 pm at [phone] or toll free [phone].  After clinic hours [phone] to have operator reach doctor.  Bring all of your medication bottles to all your appointments in the pain clinic.  To cancel or reschedule your appointment with Pain Management please remember to call 24 hours in advance to avoid a fee.  Refer to the educational materials which you have been given on: General Risks, I had my Procedure. Discharge Instructions, Post Sedation.  Post Procedure Instructions:  The drugs you were given will stay in your system until tomorrow, so for the next 24 hours you should not drive, make any legal decisions or drink any alcoholic beverages.  You may eat anything you prefer, but it is better to start with liquids then soups and crackers, and gradually work up to solid foods.  Please notify your doctor immediately if you have any unusual bleeding, trouble breathing or pain that is not related to your normal pain.  Depending on the type of procedure that was done, some parts of your body may feel week and/or numb.  This usually clears up by tonight or the next day.  Walk with the use of an assistive device or accompanied by an adult for the 24 hours.  You may use ice on the affected area for the first 24 hours.  Put ice in a Ziploc bag and cover with a towel and place against area 15 minutes on 15 minutes off.  You may switch to heat after 24 hours.

## 2018-02-04 ENCOUNTER — Telehealth: Payer: Self-pay

## 2018-02-04 NOTE — Telephone Encounter (Signed)
No answer. Left message on answering machine to call if needed.

## 2018-02-09 ENCOUNTER — Encounter (HOSPITAL_COMMUNITY): Payer: Self-pay

## 2018-02-09 ENCOUNTER — Emergency Department (HOSPITAL_COMMUNITY)
Admission: EM | Admit: 2018-02-09 | Discharge: 2018-02-09 | Disposition: A | Discharge status: Home / Self Care | Payer: Medicare Other | Attending: Emergency Medicine | Admitting: Emergency Medicine

## 2018-02-09 ENCOUNTER — Emergency Department (HOSPITAL_COMMUNITY): Payer: Medicare Other

## 2018-02-09 ENCOUNTER — Other Ambulatory Visit: Payer: Self-pay

## 2018-02-09 DIAGNOSIS — N183 Chronic kidney disease, stage 3 (moderate): Secondary | ICD-10-CM | POA: Diagnosis not present

## 2018-02-09 DIAGNOSIS — I129 Hypertensive chronic kidney disease with stage 1 through stage 4 chronic kidney disease, or unspecified chronic kidney disease: Secondary | ICD-10-CM | POA: Insufficient documentation

## 2018-02-09 DIAGNOSIS — Z85828 Personal history of other malignant neoplasm of skin: Secondary | ICD-10-CM | POA: Insufficient documentation

## 2018-02-09 DIAGNOSIS — G8929 Other chronic pain: Secondary | ICD-10-CM | POA: Diagnosis not present

## 2018-02-09 DIAGNOSIS — Z79899 Other long term (current) drug therapy: Secondary | ICD-10-CM | POA: Insufficient documentation

## 2018-02-09 DIAGNOSIS — M25551 Pain in right hip: Secondary | ICD-10-CM | POA: Insufficient documentation

## 2018-02-09 DIAGNOSIS — Z96641 Presence of right artificial hip joint: Secondary | ICD-10-CM | POA: Insufficient documentation

## 2018-02-09 DIAGNOSIS — Z87891 Personal history of nicotine dependence: Secondary | ICD-10-CM | POA: Insufficient documentation

## 2018-02-09 LAB — CBC WITH DIFFERENTIAL/PLATELET
Basophils Absolute: 0 10*3/uL (ref 0.0–0.1)
Basophils Relative: 0 %
Eosinophils Absolute: 0.2 10*3/uL (ref 0.0–0.7)
Eosinophils Relative: 3 %
HCT: 40.6 % (ref 36.0–46.0)
Hemoglobin: 12.8 g/dL (ref 12.0–15.0)
Lymphocytes Relative: 24 %
Lymphs Abs: 1.5 10*3/uL (ref 0.7–4.0)
MCH: 29.8 pg (ref 26.0–34.0)
MCHC: 31.5 g/dL (ref 30.0–36.0)
MCV: 94.4 fL (ref 78.0–100.0)
Monocytes Absolute: 0.8 10*3/uL (ref 0.1–1.0)
Monocytes Relative: 13 %
Neutro Abs: 3.7 10*3/uL (ref 1.7–7.7)
Neutrophils Relative %: 60 %
Platelets: 162 10*3/uL (ref 150–400)
RBC: 4.3 MIL/uL (ref 3.87–5.11)
RDW: 15.6 % — ABNORMAL HIGH (ref 11.5–15.5)
WBC: 6.3 10*3/uL (ref 4.0–10.5)

## 2018-02-09 LAB — BASIC METABOLIC PANEL
Anion gap: 5 (ref 5–15)
BUN: 16 mg/dL (ref 8–23)
CO2: 28 mmol/L (ref 22–32)
Calcium: 9.2 mg/dL (ref 8.9–10.3)
Chloride: 108 mmol/L (ref 98–111)
Creatinine, Ser: 0.95 mg/dL (ref 0.44–1.00)
GFR calc Af Amer: >60 mL/min (ref >60)
GFR calc non Af Amer: 57 mL/min — ABNORMAL LOW (ref >60)
Glucose, Bld: 78 mg/dL (ref 70–99)
Potassium: 3.7 mmol/L (ref 3.5–5.1)
Sodium: 141 mmol/L (ref 135–145)

## 2018-02-09 LAB — SEDIMENTATION RATE: Sed Rate: 26 mm/hr — ABNORMAL HIGH (ref 0–22)

## 2018-02-09 MED ORDER — MORPHINE SULFATE (PF) 2 MG/ML IV SOLN
2.0000 mg | Freq: Once | INTRAVENOUS | Status: AC
  Administered 2018-02-09: 2 mg via INTRAMUSCULAR
  Filled 2018-02-09: qty 1

## 2018-02-09 MED ORDER — OXYCODONE-ACETAMINOPHEN 5-325 MG PO TABS
1.0000 | ORAL_TABLET | Freq: Once | ORAL | Status: AC
  Administered 2018-02-09: 1 via ORAL
  Filled 2018-02-09: qty 1

## 2018-02-09 MED ORDER — OXYCODONE-ACETAMINOPHEN 5-325 MG PO TABS: 2.0000 | ORAL_TABLET | ORAL | 0 refills | Status: DC | PRN

## 2018-02-09 NOTE — ED Notes (Signed)
ED Provider at bedside. 

## 2018-02-09 NOTE — ED Triage Notes (Signed)
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 She presents today with severe pain to right hip/leg. No relief with prescribed Vicodin.

## 2018-02-09 NOTE — ED Notes (Signed)
Bed: HR41 Expected date:  Expected time:  Means of arrival:  Comments: ems

## 2018-02-09 NOTE — ED Provider Notes (Signed)
[facility] DEPT Provider Note   CSN: [id number] Arrival date & time: 02/09/18  1144     History   Chief Complaint Chief Complaint  Patient presents with  . Hip Pain    HPI [name] is a 77 y.o. female.  77 year old female with prior medical history as documented below who presents with complaint of right hip pain. Pain is chronic in nature. Pain ongoing for at least the last 2-3 months. She has been taking Norco at home without improvement in her symptoms. She is known to Dr. Percell Miller (Ortho) who performed a Right THA.   She denies recent injury, fever, chest pain, shortness of breath, or other acute complaint.   The history is provided by the patient and medical records.  Hip Pain  This is a new problem. The current episode started more than 1 week ago. The problem occurs constantly. The problem has not changed since onset.Pertinent negatives include no chest pain, no abdominal pain, no headaches and no shortness of breath. Nothing aggravates the symptoms. Nothing relieves the symptoms.    Past Medical History:  Diagnosis Date  . Anemia   . B12 deficiency   . Bacteremia 10/07/2014  . Bladder incontinence   . Blind left eye   . Cancer ([place])    skin cancer   . Cataract   . Cervical spine fracture ([place])   . Chest pain 07/29/2013  . Compression fracture    C7,  upper T spine -compression fx  . COPD (chronic obstructive pulmonary disease) ([place])   . DDD (degenerative disc disease), lumbar   . Depression    major  . Diffuse myofascial pain syndrome 03/24/2015  . Dyspnea    at times- when activty  . Fever 10/05/2014  . GERD (gastroesophageal reflux disease)   . Hiatal hernia   . Hyperlipidemia   . Hypertension   . 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 Macular degeneration   . Osteoarthritis   . Osteoporosis   . Pneumonia    years ago  . Reactive airway disease   . Rupture of bowel ([place])   . Sepsis ([place]) 10/08/2014  .  Stroke [facility])    "mini stroke"- balance and memory issue  . Stroke ([place])   . Wrist pain, acute 09/10/2012    Patient Active Problem List   Diagnosis Date Noted  . Acute postoperative pain 02/03/2018  . Closed hip fracture, sequela (Right) 02/03/2018  . It band syndrome, right 12/18/2017  . History of hip replacement (Right) 12/18/2017  . Ileus ([place]) 10/27/2017  . Primary osteoarthritis of hip 09/30/2017  . History of small bowel obstruction 09/10/2017  . 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 SBO (small bowel obstruction) ([place]) 08/24/2017  . Abnormal x-ray of pelvis 08/13/2017  . Other specified dorsopathies, sacral and sacrococcygeal region 08/04/2017  . Spondylosis without myelopathy or radiculopathy, lumbosacral region 07/17/2017  . Non-traumatic compression fracture of T3 thoracic vertebra, sequela 07/02/2017  . High risk medication use 07/02/2017  . At high risk for falls 07/02/2017  . Chronic sacroiliac joint pain (Right) 07/02/2017  . CKD (chronic kidney disease) stage 3, GFR 30-59 ml/min (HCC) 04/23/2017  . Chronic knee pain s/p total knee replacement (TKR) (Right) 04/02/2017  . DDD (degenerative disc disease), lumbar 03/05/2017  . Chronic knee pain (Bilateral) (R>L) 03/05/2017  . Osteoarthritis 03/05/2017  . Chronic musculoskeletal pain 03/05/2017  . Neurogenic pain 03/05/2017  . Chronic myofascial pain 02/12/2017  .  Lumbar facet syndrome (Bilateral) (L>R) 02/12/2017  . Long term prescription benzodiazepine use 02/12/2017  . Opiate use 02/12/2017  . Disorder of skeletal system 02/12/2017  . Pharmacologic therapy 02/12/2017  . Problems influencing health status 02/12/2017  . 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 NSAID long-term use 02/12/2017  . DDD (degenerative disc disease), cervical 11/11/2016  . Lumbar facet arthropathy (Bilateral) 11/11/2016  . 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 Hypertensive kidney disease, malignant 11/07/2016    . CAFL (chronic airflow limitation) ([place]) 11/07/2016  . Depression, major, in partial remission ([place]) 11/07/2016  . 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 Bed wetting 11/07/2016  . Long term current use of opiate analgesic 11/07/2016  . Long term prescription opiate use 11/07/2016  . Chronic pain syndrome 11/07/2016  . Chronic neck pain (Secondary Area of Pain) (Bilateral) (L>R) 11/07/2016  . Chronic hip pain (Right) 11/07/2016  . Rib pain 11/07/2016  . Age-related osteoporosis with current pathological fracture with routine healing 09/20/2016  . History of stroke 09/20/2016  . Major depression in remission ([place]) 09/20/2016  . Intertrochanteric fracture of femur, sequela (Right) 09/13/2016  . Fall   . Chronic shoulder pain (Bilateral) 08/12/2016  . Hx of total knee replacement (Bilateral) 04/04/2016  . Chronic shoulder pain (Left) 02/28/2016  . Hoarseness 01/12/2016  . Pharyngoesophageal dysphagia 01/12/2016  . Chronic low back pain [facility] Area of Pain) (Bilateral) (L>R) 10/26/2015  . 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 UTI (urinary tract infection) 06/25/2015  . Syncope, non cardiac 05/22/2015  . Leukopenia 10/07/2014  . Thrombocytopenia ([place]) 10/07/2014  . Hypoxia 10/05/2014  . Anxiety 06/15/2014  . Apoplectic vertigo 06/15/2014  . Duodenogastric reflux 06/15/2014  . Benign essential HTN 06/15/2014  . Infection of the upper respiratory tract 06/15/2014  . Hypercholesteremia 07/30/2013  . HTN (hypertension) 07/30/2013  . GERD (gastroesophageal reflux disease) 07/30/2013  . Dizziness and giddiness 06/18/2013  . Memory loss 05/03/2013  . Chronic knee pain (Primary Area of Pain) (Right) 09/10/2012    Past Surgical History:  Procedure Laterality Date  . ABDOMINAL HYSTERECTOMY    . ABDOMINAL SURGERY    . APPENDECTOMY    . BLADDER SURGERY    . CHOLECYSTECTOMY    . [name] SURGERY    . COLOSTOMY  REVERSAL    . EYE SURGERY     cataracts  . HIP SURGERY Right 08/2016  . INTRAMEDULLARY (IM) NAIL INTERTROCHANTERIC Right 09/13/2016   Procedure: INTRAMEDULLARY (IM) NAIL INTERTROCHANTRIC RIGHT;  Surgeon: Leandrew Koyanagi, MD;  Location: [facility];  Service: Orthopedics;  Laterality: Right;  . JOINT REPLACEMENT Bilateral   . KNEE SURGERY    . OSTOMY    . RECTOCELE REPAIR    . TOE SURGERY Right    3rd  . TOTAL HIP ARTHROPLASTY Right 09/30/2017   Procedure: RIGHT HIP IM NAIL REMOVAL WITH CONVERSION TO TOTAL HIP ARTHOPLASTY, anterior approach;  Surgeon: Renette Butters, MD;  Location: [facility];  Service: Orthopedics;  Laterality: Right;     OB History    Gravida  0   Para  0   Term  0   Preterm  0   AB  0   Living        SAB  0   TAB  0   Ectopic  0   Multiple      Live Births               Home Medications  Prior to Admission medications   Medication Sig Start Date End Date Taking? Authorizing Provider  albuterol (ACCUNEB) 1.25 MG/3ML nebulizer solution Take 1 ampule by nebulization every 6 (six) hours as needed for wheezing.   Yes [provider]  albuterol (PROVENTIL HFA;VENTOLIN HFA) 108 (90 BASE) MCG/ACT inhaler Inhale 2 puffs into the lungs every 6 (six) hours as needed for wheezing or shortness of breath.    Yes [provider]  alendronate (FOSAMAX) 70 MG tablet Take 70 mg by mouth every Monday. Take with a full glass of water on an empty stomach.   Yes [provider]  ALPRAZolam Duanne Moron) 1 MG tablet Take 1 mg by mouth at bedtime as needed for sleep.   Yes [provider]  amLODipine (NORVASC) 5 MG tablet Take 5 mg by mouth daily.  04/06/13  Yes [provider]  busPIRone (BUSPAR) 7.5 MG tablet Take 7.5 mg by mouth daily as needed (anxiety, relaxation, sleep).   Yes [provider]  Calcium-Phosphorus-Vitamin D (CALCIUM/D3 ADULT GUMMIES PO) Take 1 tablet by mouth daily.   Yes [provider]  citalopram  (CELEXA) 20 MG tablet Take 20 mg by mouth daily.  02/27/17  Yes [provider]  Dextromethorphan-guaiFENesin (TUSSIN DM MAX PO) Take 5 mLs by mouth 2 (two) times daily as needed (cough).   Yes [provider]  docusate sodium (COLACE) 100 MG capsule Take 200 mg by mouth 2 (two) times daily as needed for moderate constipation.    Yes [provider]  esomeprazole (NEXIUM) 40 MG capsule Take 40 mg by mouth daily.    Yes [provider]  Ferrous Sulfate (IRON) 325 (65 Fe) MG TABS Take 1 tablet (325 mg total) by mouth 2 (two) times daily. Patient taking differently: Take 1 tablet by mouth 2 (two) times a week.  10/01/17  Yes Prudencio Burly III, PA-C  gabapentin (NEURONTIN) 400 MG capsule Take 400 mg by mouth 3 (three) times daily.   Yes [provider]  guaiFENesin (MUCINEX) 600 MG 12 hr tablet Take 600 mg by mouth 2 (two) times daily as needed for cough.   Yes [provider]  HYDROcodone-acetaminophen (NORCO/VICODIN) 5-325 MG tablet Take 1 tablet by mouth every 8 (eight) hours as needed for up to 7 days for severe pain. Patient taking differently: Take 1 tablet by mouth 2 (two) times daily as needed for severe pain.  02/03/18 02/10/18 Yes Milinda Pointer, MD  linaclotide (LINZESS) 290 MCG CAPS capsule Take 290 mcg by mouth daily.   Yes [provider]  lovastatin (MEVACOR) 10 MG tablet Take 10 mg by mouth daily. 09/22/14  Yes [provider]  meloxicam (MOBIC) 15 MG tablet Take 15 mg by mouth daily.  05/23/16  Yes [provider]  Menthol, Topical Analgesic, (BIOFREEZE EX) Apply 1 application topically daily as needed (muscle pain).   Yes [provider]  Multiple Vitamins-Minerals (MULTIVITAMIN GUMMIES ADULT PO) Take 1 tablet by mouth daily.   Yes [provider]  ondansetron (ZOFRAN-ODT) 8 MG disintegrating tablet Take 8 mg by mouth every 8 (eight) hours as needed for nausea or vomiting.   Yes  [provider]  orphenadrine (NORFLEX) 100 MG tablet Take 1 tablet (100 mg total) by mouth 2 (two) times daily. 12/09/17 03/09/18 Yes King, Diona Foley, NP  polyethylene glycol (MIRALAX / GLYCOLAX) packet Take 17 g by mouth daily as needed (for constipation.).    Yes [provider]  traZODone (DESYREL) 100 MG  tablet Take 100 mg by mouth at bedtime.    Yes [provider]  HYDROcodone-acetaminophen (NORCO/VICODIN) 5-325 MG tablet Take 1 tablet by mouth every 8 (eight) hours as needed for up to 7 days for severe pain. Patient not taking: Reported on 02/09/2018 02/10/18 02/17/18  Milinda Pointer, MD  oxyCODONE (OXY IR/ROXICODONE) 5 MG immediate release tablet Take 1 tablet (5 mg total) by mouth every 8 (eight) hours as needed for severe pain. 12/29/17 01/28/18  Milinda Pointer, MD  oxyCODONE-acetaminophen (PERCOCET/ROXICET) 5-325 MG tablet Take 2 tablets by mouth every 4 (four) hours as needed for severe pain. 02/09/18   Valarie Merino, MD  pregabalin (LYRICA) 75 MG capsule Take 1 capsule (75 mg total) by mouth 2 (two) times daily. 12/29/17 01/28/18  Milinda Pointer, MD    Family History Family History  Problem Relation Age of Onset  . Heart failure Mother   . Heart attack Father     Social History Social History   Tobacco Use  . Smoking status: Former Smoker    Packs/day: 1.50    Years: 30.00    Pack years: 45.00    Types: E-cigarettes, Cigarettes    Last attempt to quit: 2013    Years since quitting: 6.7  . Smokeless tobacco: Never Used  Substance Use Topics  . Alcohol use: No    Alcohol/week: 0.0 standard drinks  . Drug use: No     Allergies   Ampicillin; Ampicillin-sulbactam sodium; Ambien [zolpidem tartrate]; Flexeril [cyclobenzaprine]; Tape; Toradol [ketorolac tromethamine]; Sulfamethoxazole-trimethoprim; Zolpidem; Cephalexin; and Tramadol   Review of Systems Review of Systems  Respiratory: Negative for shortness of breath.   Cardiovascular:  Negative for chest pain.  Gastrointestinal: Negative for abdominal pain.  Neurological: Negative for headaches.  All other systems reviewed and are negative.    Physical Exam Updated Vital Signs BP (!) 167/93   Pulse 91   Temp 98.1 F (36.7 C) (Oral)   Resp 16   SpO2 95%   Physical Exam  Constitutional: She is oriented to person, place, and time. She appears well-developed and well-nourished. No distress.  HENT:  Head: Normocephalic and atraumatic.  Mouth/Throat: Oropharynx is clear and moist.  Eyes: Pupils are equal, round, and reactive to light. Conjunctivae and EOM are normal.  Neck: Normal range of motion. Neck supple.  Cardiovascular: Normal rate, regular rhythm and normal heart sounds.  Pulmonary/Chest: Effort normal and breath sounds normal. No respiratory distress.  Abdominal: Soft. She exhibits no distension. There is no tenderness.  Musculoskeletal: Normal range of motion. She exhibits tenderness. She exhibits no edema or deformity.  Mild tenderness localized to the right lateral hip  Distal RLE is NVI  Ambulatory  Neurological: She is alert and oriented to person, place, and time.  Skin: Skin is warm and dry.  Psychiatric: She has a normal mood and affect.  Nursing note and vitals reviewed.    ED Treatments / Results  Labs (all labs ordered are listed, but only abnormal results are displayed) Labs Reviewed  CBC WITH DIFFERENTIAL/PLATELET - Abnormal; Notable for the following components:      Result Value   RDW 15.6 (*)    All other components within normal limits  BASIC METABOLIC PANEL - Abnormal; Notable for the following components:   GFR calc non Af Amer 57 (*)    All other components within normal limits  SEDIMENTATION RATE - Abnormal; Notable for the following components:   Sed Rate 26 (*)    All other components within normal  limits    EKG None  Radiology Dg Hip Unilat W Or Wo Pelvis 2-3 Views Right  Result Date: 02/09/2018 CLINICAL DATA:   Status post right hip joint replacement 5 months ago. Severe onset of right hip and leg pain today. No recent fall. EXAM: DG HIP (WITH OR WITHOUT PELVIS) 2-3V RIGHT COMPARISON:  Right hip series of October 27, 2017 FINDINGS: The bony pelvis is subjectively osteopenic. There is no acute or healing pelvic fracture. AP and lateral views of the prosthetic left hip reveal radiographic positioning of the prosthetic components to be good. The interface with the native bone appears normal. IMPRESSION: There is no acute abnormality of the left hip prosthesis nor of the native bone. The observed portions of the pelvis exhibit no acute abnormalities. Electronically Signed   By: David  Martinique M.D.   On: 02/09/2018 14:33    Procedures Procedures (including critical care time)  Medications Ordered in ED Medications  morphine 2 MG/ML injection 2 mg (2 mg Intramuscular Given 02/09/18 1405)  oxyCODONE-acetaminophen (PERCOCET/ROXICET) 5-325 MG per tablet 1 tablet (1 tablet Oral Given 02/09/18 1657)     Initial Impression / Assessment and Plan / ED Course  I have reviewed the triage vital signs and the nursing notes.  Pertinent labs & imaging results that were available during my care of the patient were reviewed by me and considered in my medical decision making (see chart for details).     MDM  Screen Complete  Patient presents with right sided hip pain - chronic in nature.   Case discussed with Dr. Edmonia Lynch - patient is well known to him. He requests baseline screening labs and Xray. He does not feel that the patient requires admission at this time. He will FU with the patient on an outpatient basis later this week. He is in agreement with prescription of small number of percocet for home use until follow up.   Patient is safe to discharge.   Importance of close follow up is stressed.   Strict return precautions given and understood.  Final Clinical Impressions(s) / ED Diagnoses   Final  diagnoses:  Chronic right hip pain    ED Discharge Orders         Ordered    oxyCODONE-acetaminophen (PERCOCET/ROXICET) 5-325 MG tablet  Every 4 hours PRN     02/09/18 1751           Valarie Merino, MD 02/09/18 2057

## 2018-02-09 NOTE — Discharge Instructions (Addendum)
Please return for any problem. Follow up with Dr. Percell Miller tomorrow as instructed.

## 2018-02-17 ENCOUNTER — Emergency Department (HOSPITAL_COMMUNITY)
Admission: EM | Admit: 2018-02-17 | Discharge: 2018-02-17 | Disposition: A | Discharge status: Home / Self Care | Payer: Medicare Other | Attending: Emergency Medicine | Admitting: Emergency Medicine

## 2018-02-17 ENCOUNTER — Emergency Department (HOSPITAL_COMMUNITY): Payer: Medicare Other

## 2018-02-17 ENCOUNTER — Encounter (HOSPITAL_COMMUNITY): Payer: Self-pay

## 2018-02-17 DIAGNOSIS — Z87891 Personal history of nicotine dependence: Secondary | ICD-10-CM | POA: Insufficient documentation

## 2018-02-17 DIAGNOSIS — M25451 Effusion, right hip: Secondary | ICD-10-CM | POA: Insufficient documentation

## 2018-02-17 DIAGNOSIS — M25551 Pain in right hip: Secondary | ICD-10-CM | POA: Diagnosis present

## 2018-02-17 DIAGNOSIS — R52 Pain, unspecified: Secondary | ICD-10-CM

## 2018-02-17 DIAGNOSIS — N183 Chronic kidney disease, stage 3 (moderate): Secondary | ICD-10-CM | POA: Insufficient documentation

## 2018-02-17 DIAGNOSIS — J449 Chronic obstructive pulmonary disease, unspecified: Secondary | ICD-10-CM | POA: Diagnosis not present

## 2018-02-17 DIAGNOSIS — M25559 Pain in unspecified hip: Secondary | ICD-10-CM

## 2018-02-17 DIAGNOSIS — Z79899 Other long term (current) drug therapy: Secondary | ICD-10-CM | POA: Diagnosis not present

## 2018-02-17 DIAGNOSIS — I129 Hypertensive chronic kidney disease with stage 1 through stage 4 chronic kidney disease, or unspecified chronic kidney disease: Secondary | ICD-10-CM | POA: Diagnosis not present

## 2018-02-17 DIAGNOSIS — M2540 Effusion, unspecified joint: Secondary | ICD-10-CM

## 2018-02-17 LAB — SYNOVIAL CELL COUNT + DIFF, W/ CRYSTALS
Crystals, Fluid: NONE SEEN
Lymphocytes-Synovial Fld: 70 % — ABNORMAL HIGH (ref 0–20)
Monocyte-Macrophage-Synovial Fluid: 26 % — ABNORMAL LOW (ref 50–90)
Neutrophil, Synovial: 4 % (ref 0–25)
WBC, Synovial: 22 /mm3 (ref 0–200)

## 2018-02-17 LAB — GRAM STAIN

## 2018-02-17 MED ORDER — SODIUM CHLORIDE 0.9 % IJ SOLN: 10.0000 mL | Freq: Once | INTRAMUSCULAR | Status: DC

## 2018-02-17 MED ORDER — OXYCODONE-ACETAMINOPHEN 5-325 MG PO TABS
2.0000 | ORAL_TABLET | Freq: Once | ORAL | Status: AC
  Administered 2018-02-17: 2 via ORAL
  Filled 2018-02-17: qty 2

## 2018-02-17 MED ORDER — MORPHINE SULFATE (PF) 4 MG/ML IV SOLN
4.0000 mg | Freq: Once | INTRAVENOUS | Status: AC
  Administered 2018-02-17: 4 mg via INTRAVENOUS
  Filled 2018-02-17: qty 1

## 2018-02-17 MED ORDER — IOPAMIDOL (ISOVUE-M 200) INJECTION 41%: 20.0000 mL | Freq: Once | INTRAMUSCULAR | Status: DC

## 2018-02-17 MED ORDER — LIDOCAINE HCL (PF) 1 % IJ SOLN
5.0000 mL | Freq: Once | INTRAMUSCULAR | Status: AC
  Administered 2018-02-17: 3 mL via INTRADERMAL

## 2018-02-17 MED ORDER — MORPHINE SULFATE (PF) 4 MG/ML IV SOLN
4.0000 mg | Freq: Once | INTRAVENOUS | Status: AC
  Administered 2018-02-17: 4 mg via INTRAMUSCULAR
  Filled 2018-02-17: qty 1

## 2018-02-17 MED ORDER — MORPHINE SULFATE (PF) 4 MG/ML IV SOLN
4.0000 mg | INTRAVENOUS | Status: AC
  Administered 2018-02-17: 4 mg via INTRAMUSCULAR
  Filled 2018-02-17: qty 1

## 2018-02-17 MED ORDER — OXYCODONE-ACETAMINOPHEN 5-325 MG PO TABS: 1.0000 | ORAL_TABLET | Freq: Three times a day (TID) | ORAL | 0 refills | Status: DC | PRN

## 2018-02-17 NOTE — ED Provider Notes (Signed)
8:23 AM Assumed care from Dr. Tomi Bamberger, please see their note for full history, physical and decision making until this point. In brief this is a 77 y.o. year old female who presented to the ED tonight with No chief complaint on file.     Pending [place] to eval for hip effusion. If present, needs IR arthrocentesis for septic hip. Pain control in mean time.   [place] with effusion. IR paged.   D/w IR, will be awhile prior to ability to do procedure. I don't feel comfortable aspirating hip joint, will have to keep in ED pending IR arthrocentesis. Persistently afebrile. Will allow to eat. Appears very comfortable even when stating her pain is 10/10  Care to Dr. Zenia Resides pending results.   Labs, studies and imaging reviewed by myself and considered in medical decision making if ordered. Imaging interpreted by radiology.  Labs Reviewed - No data to display  CT Hip Right Wo Contrast  Final Result      No follow-ups on file.    Lizania Bouchard, Corene Cornea, MD 02/18/18 [phone]

## 2018-02-17 NOTE — ED Notes (Signed)
Gave pt milk, per Elmyra Ricks - RN.

## 2018-02-17 NOTE — ED Notes (Signed)
Family back at bedside.  Updated that we are still waiting for results of aspirated fluid.

## 2018-02-17 NOTE — ED Notes (Addendum)
Pt. Requesting pain medications. Tomi Bamberger, MD called and said she needed to see the patient and would be by soon.

## 2018-02-17 NOTE — ED Notes (Signed)
Pt stable, ambulatory, states understanding of discharge instructions 

## 2018-02-17 NOTE — Procedures (Signed)
CLINICAL DATA: Fluid collection around the neck of the right hip prosthesis.  Prominent right hip and knee pain.  EXAM: RIGHT HIP ASPIRATION UNDER FLUOROSCOPY  FLUOROSCOPY TIME: Fluoroscopy Time:  30 seconds  Radiation Exposure Index (if provided by the fluoroscopic device):  N/A  Number of Acquired Spot Images: 0  PROCEDURE:  I discussed the risks (including hemorrhage, infection, and allergic reaction, among others), benefits, and alternatives to the procedure with the patient.  We specifically discussed the high technical likelihood of success of the procedure.  The patient understood and elected to undergo the procedure.    Standard time-out was employed.  Following sterile skin prep and local anesthetic administration consisting of 1% lidocaine, a 22 gauge needle was advanced without difficulty to the metal margin of the component of the right femoral prosthesis under fluoroscopic guidance.  A total of 16 cc of straw-colored fluid was aspirated from the joint.  The needle was subsequently removed and the skin cleansed and bandaged.  No immediate complications were observed.    IMPRESSION: Successful aspiration of the right hip yielding 16 cc of straw-colored fluid sent to the lab.

## 2018-02-17 NOTE — ED Notes (Signed)
Pt. To CT via stretcher. 

## 2018-02-17 NOTE — ED Notes (Signed)
Patient transported to X-ray 

## 2018-02-17 NOTE — ED Notes (Signed)
Pt rang out states she is hurting again.  Dr. Dayna Barker notified.

## 2018-02-17 NOTE — ED Provider Notes (Signed)
Patient signed out to me from Dr. Dayna Barker pending results of joint aspirate.  Results showed no evidence of septic joint at this time with only 22 white blood cells.  Gram stain also was negative for organisms.  The patient will be discharged and follow with her orthopedist   Lacretia Leigh, MD 02/17/18 2128

## 2018-02-17 NOTE — ED Notes (Signed)
Dinner tray ordered.

## 2018-02-17 NOTE — ED Notes (Signed)
Dinner tray arrived

## 2018-02-17 NOTE — ED Notes (Signed)
Radiology called to advise they will be coming for patient in just a few minutes to take her for the procedure

## 2018-02-17 NOTE — ED Triage Notes (Signed)
Pt. Came by EMS from home with reports of severe right sided hip pain. Pt. Had a complete hip replacement in May. Pt. Recently saw PCP  And they tried to drain fluid off of hip but was unsuccessful. PCP is sending her for a scan of her hip. Pt. Is currently in a pain clinic. A/O X4 NAD.

## 2018-02-17 NOTE — ED Notes (Signed)
Patient transported to Ultrasound 

## 2018-02-17 NOTE — ED Provider Notes (Signed)
[name] Provider Note   CSN: [id number] Arrival date & time: 02/17/18  2202  Time seen 3:35 (had to go to bathroom at 3:15 AM)   History   Chief Complaint No chief complaint on file.   HPI [name] is a 77 y.o. female.  HPI patient had delayed union of a closed hip fracture on the right with a intramedullary nail that was converted to a total hip arthroplasty on Sep 30, 2017 by Dr. Percell [name].  She reports she has had chronic pain for over a year since she originally broke that hip.  She states she had physical therapy at home.  She continues to have severe pain and states she cannot walk because she falls because of the severity of the pain in her leg gives out.  She has been using a walker.  She is followed at pain management and is had multiple therapeutic modalities done including injections.  She states "nothing helps".  She was seen last week by Dr. Debroah [name] office after she had a fever of 102 on September 16.  They tried to aspirate fluid from her joint without success so they felt confident there was no infection there.  She states she was told she could have some loosening of her stem and they had recommended she get a bone scan.  That is being scheduled in the next couple weeks.  She states her excision is healing well.  PCP Nicole Low, MD   Past Medical History:  Diagnosis Date  . Anemia   . B12 deficiency   . Bacteremia 10/07/2014  . Bladder incontinence   . Blind left eye   . Cancer ([place])    skin cancer   . Cataract   . Cervical spine fracture ([place])   . Chest pain 07/29/2013  . Compression fracture    C7,  upper T spine -compression fx  . COPD (chronic obstructive pulmonary disease) ([place])   . DDD (degenerative disc disease), lumbar   . Depression    major  . Diffuse myofascial pain syndrome 03/24/2015  . Dyspnea    at times- when activty  . Fever 10/05/2014  . GERD (gastroesophageal reflux disease)   . Hiatal  hernia   . Hyperlipidemia   . Hypertension   . 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 Macular degeneration   . Osteoarthritis   . Osteoporosis   . Pneumonia    years ago  . Reactive airway disease   . Rupture of bowel ([place])   . Sepsis ([place]) 10/08/2014  . Stroke [facility])    "mini stroke"- balance and memory issue  . Stroke ([place])   . Wrist pain, acute 09/10/2012    Patient Active Problem List   Diagnosis Date Noted  . Acute postoperative pain 02/03/2018  . Closed hip fracture, sequela (Right) 02/03/2018  . It band syndrome, right 12/18/2017  . History of hip replacement (Right) 12/18/2017  . Ileus ([place]) 10/27/2017  . Primary osteoarthritis of hip 09/30/2017  . History of small bowel obstruction 09/10/2017  . 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 SBO (small bowel obstruction) ([place]) 08/24/2017  . Abnormal x-ray of pelvis 08/13/2017  . Other specified dorsopathies, sacral and sacrococcygeal region 08/04/2017  . Spondylosis without myelopathy or radiculopathy, lumbosacral region 07/17/2017  . Non-traumatic compression fracture of T3 thoracic vertebra, sequela 07/02/2017  . High risk medication use 07/02/2017  . At high risk for falls 07/02/2017  . Chronic sacroiliac  joint pain (Right) 07/02/2017  . CKD (chronic kidney disease) stage 3, GFR 30-59 ml/min (HCC) 04/23/2017  . Chronic knee pain s/p total knee replacement (TKR) (Right) 04/02/2017  . DDD (degenerative disc disease), lumbar 03/05/2017  . Chronic knee pain (Bilateral) (R>L) 03/05/2017  . Osteoarthritis 03/05/2017  . Chronic musculoskeletal pain 03/05/2017  . Neurogenic pain 03/05/2017  . Chronic myofascial pain 02/12/2017  . Lumbar facet syndrome (Bilateral) (L>R) 02/12/2017  . Long term prescription benzodiazepine use 02/12/2017  . Opiate use 02/12/2017  . Disorder of skeletal system 02/12/2017  . Pharmacologic therapy 02/12/2017  . Problems influencing health status 02/12/2017  .  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 NSAID long-term use 02/12/2017  . DDD (degenerative disc disease), cervical 11/11/2016  . Lumbar facet arthropathy (Bilateral) 11/11/2016  . 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 CAFL (chronic airflow limitation) ([place]) 11/07/2016  . Depression, major, in partial remission ([place]) 11/07/2016  . 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 Bed wetting 11/07/2016  . Long term current use of opiate analgesic 11/07/2016  . Long term prescription opiate use 11/07/2016  . Chronic pain syndrome 11/07/2016  . Chronic neck pain (Secondary Area of Pain) (Bilateral) (L>R) 11/07/2016  . Chronic hip pain (Right) 11/07/2016  . Rib pain 11/07/2016  . Age-related osteoporosis with current pathological fracture with routine healing 09/20/2016  . History of stroke 09/20/2016  . Major depression in remission ([place]) 09/20/2016  . Intertrochanteric fracture of femur, sequela (Right) 09/13/2016  . Fall   . Chronic shoulder pain (Bilateral) 08/12/2016  . Hx of total knee replacement (Bilateral) 04/04/2016  . Chronic shoulder pain (Left) 02/28/2016  . Hoarseness 01/12/2016  . Pharyngoesophageal dysphagia 01/12/2016  . Chronic [name] back pain [facility]) (Bilateral) (L>R) 10/26/2015  . 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 UTI (urinary tract infection) 06/25/2015  . Syncope, non cardiac 05/22/2015  . Leukopenia 10/07/2014  . Thrombocytopenia ([place]) 10/07/2014  . Hypoxia 10/05/2014  . Anxiety 06/15/2014  . Apoplectic vertigo 06/15/2014  . Duodenogastric reflux 06/15/2014  . Benign essential HTN 06/15/2014  . Infection of the upper respiratory tract 06/15/2014  . Hypercholesteremia 07/30/2013  . HTN (hypertension) 07/30/2013  . GERD (gastroesophageal reflux disease) 07/30/2013  . Dizziness and giddiness 06/18/2013  . Memory loss  05/03/2013  . Chronic knee pain (Primary Area of Pain) (Right) 09/10/2012    Past Surgical History:  Procedure Laterality Date  . ABDOMINAL HYSTERECTOMY    . ABDOMINAL SURGERY    . APPENDECTOMY    . BLADDER SURGERY    . CHOLECYSTECTOMY    . COLON SURGERY    . COLOSTOMY REVERSAL    . EYE SURGERY     cataracts  . HIP SURGERY Right 08/2016  . INTRAMEDULLARY (IM) NAIL INTERTROCHANTERIC Right 09/13/2016   Procedure: INTRAMEDULLARY (IM) NAIL INTERTROCHANTRIC RIGHT;  Surgeon: Leandrew Koyanagi, MD;  Location: [facility];  Service: Orthopedics;  Laterality: Right;  . JOINT REPLACEMENT Bilateral   . KNEE SURGERY    . OSTOMY    . RECTOCELE REPAIR    . TOE SURGERY Right    3rd  . TOTAL HIP ARTHROPLASTY Right 09/30/2017   Procedure: RIGHT HIP IM NAIL REMOVAL WITH CONVERSION TO TOTAL HIP ARTHOPLASTY, anterior approach;  Surgeon: Renette Butters, MD;  Location: [facility];  Service: Orthopedics;  Laterality: Right;     OB History    Gravida  0  Para  0   Term  0   Preterm  0   AB  0   Living        SAB  0   TAB  0   Ectopic  0   Multiple      Live Births               Home Medications    Prior to Admission medications   Medication Sig Start Date End Date Taking? Authorizing Provider  albuterol (ACCUNEB) 1.25 MG/3ML nebulizer solution Take 1 ampule by nebulization every 6 (six) hours as needed for wheezing.   Yes [provider]  albuterol (PROVENTIL HFA;VENTOLIN HFA) 108 (90 BASE) MCG/ACT inhaler Inhale 2 puffs into the lungs every 6 (six) hours as needed for wheezing or shortness of breath.    Yes [provider]  alendronate (FOSAMAX) 70 MG tablet Take 70 mg by mouth every Monday. Take with a full glass of water on an empty stomach.   Yes [provider]  ALPRAZolam Duanne Moron) 1 MG tablet Take 1 mg by mouth at bedtime as needed for sleep.   Yes [provider]  amLODipine (NORVASC) 5 MG tablet Take 5 mg by mouth daily.  04/06/13  Yes [provider]  busPIRone (BUSPAR) 7.5 MG tablet Take 7.5 mg by mouth daily as needed (anxiety, relaxation, sleep).   Yes [provider]  Calcium-Phosphorus-Vitamin D (CALCIUM/D3 ADULT GUMMIES PO) Take 1 tablet by mouth daily.   Yes [provider]  citalopram (CELEXA) 20 MG tablet Take 20 mg by mouth daily.  02/27/17  Yes [provider]  Dextromethorphan-guaiFENesin (TUSSIN DM MAX PO) Take 5 mLs by mouth 2 (two) times daily as needed (cough).   Yes [provider]  docusate sodium (COLACE) 100 MG capsule Take 200 mg by mouth 2 (two) times daily as needed for moderate constipation.    Yes [provider]  esomeprazole (NEXIUM) 40 MG capsule Take 40 mg by mouth daily.    Yes [provider]  Ferrous Sulfate (IRON) 325 (65 Fe) MG TABS Take 1 tablet (325 mg total) by mouth 2 (two) times daily. Patient taking differently: Take 1 tablet by mouth 2 (two) times a week.  10/01/17  Yes Prudencio Burly III, PA-C  gabapentin (NEURONTIN) 400 MG capsule Take 400 mg by mouth 3 (three) times daily.   Yes [provider]  guaiFENesin (MUCINEX) 600 MG 12 hr tablet Take 600 mg by mouth 2 (two) times daily as needed for cough.   Yes [provider]  linaclotide (LINZESS) 290 MCG CAPS capsule Take 290 mcg by mouth daily.   Yes [provider]  lovastatin (MEVACOR) 10 MG tablet Take 10 mg by mouth daily. 09/22/14  Yes [provider]  meloxicam (MOBIC) 15 MG tablet Take 15 mg by mouth daily.  05/23/16  Yes [provider]  Menthol, Topical Analgesic, (BIOFREEZE EX) Apply 1 application topically daily as needed (muscle pain).   Yes [provider]  Multiple Vitamins-Minerals (MULTIVITAMIN GUMMIES ADULT PO) Take 1 tablet by mouth daily.   Yes [provider]  ondansetron (ZOFRAN-ODT) 8 MG disintegrating tablet Take 8 mg by mouth every 8 (eight) hours as needed for nausea or vomiting.   Yes [provider]  orphenadrine (NORFLEX) 100 MG tablet Take 1 tablet (100 mg total) by mouth 2 (two) times daily. 12/09/17 03/09/18 Yes Vevelyn Francois, NP  oxyCODONE (OXY IR/ROXICODONE) 5 MG immediate release  tablet Take 1 tablet (5 mg total) by mouth every 8 (eight) hours as needed for severe pain. 12/29/17 02/17/26 Yes Milinda Pointer, MD  oxyCODONE-acetaminophen (PERCOCET/ROXICET) 5-325 MG tablet Take 2 tablets by mouth every 4 (four) hours as needed for severe pain. 02/09/18  Yes Valarie Merino, MD  polyethylene glycol (MIRALAX / GLYCOLAX) packet Take 17 g by mouth daily as needed (for constipation.).    Yes [provider]  pregabalin (LYRICA) 75 MG capsule Take 1 capsule (75 mg total) by mouth 2 (two) times daily. 12/29/17 02/17/26 Yes Milinda Pointer, MD  traZODone (DESYREL) 100 MG tablet Take 100 mg by mouth at bedtime.    Yes [provider]  HYDROcodone-acetaminophen (NORCO/VICODIN) 5-325 MG tablet Take 1 tablet by mouth every 8 (eight) hours as needed for up to 7 days for severe pain. Patient not taking: Reported on 02/09/2018 02/10/18 02/17/18  Milinda Pointer, MD    Family History Family History  Problem Relation Age of Onset  . Heart failure Mother   . Heart attack Father     Social History Social History   Tobacco Use  . Smoking status: Former Smoker    Packs/day: 1.50    Years: 30.00    Pack years: 45.00    Types: E-cigarettes, Cigarettes    Last attempt to quit: 2013    Years since quitting: 6.7  . Smokeless tobacco: Never Used  Substance Use Topics  . Alcohol use: No    Alcohol/week: 0.0 standard drinks  . Drug use: No  lives at home Uses a walker   Allergies   Ampicillin; Ampicillin-sulbactam sodium; Ambien [zolpidem tartrate]; Flexeril [cyclobenzaprine]; Tape; Toradol [ketorolac tromethamine]; Sulfamethoxazole-trimethoprim; Zolpidem; Cephalexin; and Tramadol   Review of Systems Review of Systems  All other systems reviewed and are  negative.    Physical Exam Updated Vital Signs BP 101/67   Pulse (!) 36   Temp 98.3 F (36.8 C) (Oral)   Resp 12   Ht 5\' 4"  (1.626 m)   Wt 75.3 kg   SpO2 91%   BMI 28.49 kg/m   Vital signs normal except for hyperventilation   Physical Exam  Constitutional: She is oriented to person, place, and time. She appears well-developed and well-nourished. She appears distressed.  HENT:  Head: Normocephalic and atraumatic.  Right Ear: External ear normal.  Left Ear: External ear normal.  Nose: Nose normal.  Eyes: Conjunctivae and EOM are normal.  Neck: Normal range of motion.  Cardiovascular: Normal rate.  Pulmonary/Chest: Effort normal. No respiratory distress.  Musculoskeletal: Normal range of motion. She exhibits tenderness. She exhibits no deformity.  Patient was seen trying to get up out of bed to walk to the bathroom and she was bent over and acted like she was going to fall.  She was placed in a wheelchair by nursing staff and taken to the bathroom.  Neurological: She is alert and oriented to person, place, and time. No cranial nerve deficit.  Skin: Skin is warm and dry.  Psychiatric: Her mood appears anxious. Her speech is rapid and/or pressured. She is hyperactive.  Nursing note and vitals reviewed.    ED Treatments / Results  Labs (all labs ordered are listed, but only abnormal results are displayed) Labs Reviewed - No data to display  EKG None  Radiology Ct Hip Right Wo Contrast  Result Date: 02/17/2018 CLINICAL DATA:  77 year old female with worsening right hip pain since hip replacement. EXAM: CT OF THE RIGHT HIP WITHOUT CONTRAST TECHNIQUE: Multidetector CT imaging  of the right hip was performed according to the standard protocol. Multiplanar CT image reconstructions were also generated. COMPARISON:  Right hip radiograph dated 02/09/2018 and CT of the abdomen pelvis dated 10/26/2017. FINDINGS: Evaluation of this exam is limited in the absence of intravenous  contrast. Evaluation is also limited due to streak artifact caused by metallic arthroplasty. Bones/Joint/Cartilage There is a total right hip arthroplasty which appears intact and in anatomic alignment. There is satisfactory contact between the arthroplasty and bone. There is no acute fracture or dislocation. There is soft tissue thickening around the neck of the arthroplasty which may represent effusion. Evaluation of this area is limited due to streak artifact caused by arthroplasty. Ultrasound may provide better evaluation. Ligaments Suboptimally assessed by CT. Muscles and Tendons No acute findings.  No intramuscular hematoma. Soft tissues Right hip subcutaneous scarring, postsurgical.  No fluid collection. IMPRESSION: 1. No acute fracture or dislocation. 2. Right hip arthroplasty appears intact with no evidence of loosening. There may fluid within the joint adjacent to the neck of the arthroplasty. Ultrasound may provide better evaluation. Electronically Signed   By: Anner Crete M.D.   On: 02/17/2018 06:11    Procedures Procedures (including critical care time)  Medications Ordered in ED Medications  morphine 4 MG/ML injection 4 mg (4 mg Intramuscular Given 02/17/18 0353)  morphine 4 MG/ML injection 4 mg (4 mg Intramuscular Given 02/17/18 0743)     Initial Impression / Assessment and Plan / ED Course  I have reviewed the triage vital signs and the nursing notes.  Pertinent labs & imaging results that were available during my care of the patient were reviewed by me and considered in my medical decision making (see chart for details).      I have explained to patient and her daughter that bone scan is not an emergency procedure needs to be scheduled due to the isotope needed.  However CT of her hip was done to look for any acute problems.  Patient was given morphine IM.  The nurse quickly let me know the patient states it did not help.  08:11 AM Dr French Ana, orthopedics, wants to have her  joint aspirated,if possible while in the ED whether by [place] or flouro, States should f/u with Dr [name] in the next week if they are able to aspirate fluid.   08:21 Dr Janeece Fitting, radiology, has reviewed her CT, recommends getting an [place] to see if there is fluid, hematoma from attempt to aspirate in the office or inflamed synovium.   08:30 AM Dr Dolly Rias, made aware of patient at change of shift.   8:32 AM when I went in the room to talk to patient about her plan, patient is sound asleep laying on her right side.  She was awakened and told her plan.  Review of the [place] shows patient has been getting #90 hydrocodone 5/325 about every 2 months, last filled July 16.  She then got #90 oxycodone 5 mg tablets on August 5.  She got #21 hydrocodone 5/325 on September 10 and #15 oxycodone 5/325 on September 17.  She also gets #30 alprazolam 1 mg tablets last filled August 13.  Final Clinical Impressions(s) / ED Diagnoses   Final diagnoses:  Joint effusion  Pain  Pain of right hip joint    Disposition pending  Rolland Porter, MD, Barbette Or, MD 02/17/18 [phone]

## 2018-02-18 LAB — GLUCOSE, BODY FLUID OTHER: Glucose, Body Fluid Other: 65 mg/dL

## 2018-02-18 LAB — PROTEIN, BODY FLUID (OTHER): Total Protein, Body Fluid Other: 3.4 g/dL

## 2018-02-22 LAB — CULTURE, BODY FLUID-BOTTLE: Culture: NO GROWTH

## 2018-02-22 LAB — CULTURE, BODY FLUID W GRAM STAIN -BOTTLE

## 2018-02-23 ENCOUNTER — Other Ambulatory Visit (HOSPITAL_COMMUNITY): Payer: Self-pay | Admitting: Orthopedic Surgery

## 2018-02-23 DIAGNOSIS — T84030A Mechanical loosening of internal right hip prosthetic joint, initial encounter: Secondary | ICD-10-CM

## 2018-02-25 ENCOUNTER — Encounter (HOSPITAL_COMMUNITY): Payer: Medicare Other

## 2018-02-26 ENCOUNTER — Ambulatory Visit: Payer: Medicare Other | Admitting: Pain Medicine

## 2018-02-26 ENCOUNTER — Emergency Department
Admission: EM | Admit: 2018-02-26 | Discharge: 2018-02-26 | Disposition: A | Discharge status: Home / Self Care | Payer: Medicare Other | Attending: Emergency Medicine | Admitting: Emergency Medicine

## 2018-02-26 ENCOUNTER — Encounter: Payer: Self-pay | Admitting: Pain Medicine

## 2018-02-26 ENCOUNTER — Ambulatory Visit (HOSPITAL_BASED_OUTPATIENT_CLINIC_OR_DEPARTMENT_OTHER): Payer: Medicare Other | Admitting: Pain Medicine

## 2018-02-26 ENCOUNTER — Other Ambulatory Visit: Payer: Self-pay

## 2018-02-26 VITALS — BP 128/117 | HR 80 | Temp 98.7°F | Resp 22 | Ht 64.0 in | Wt 168.0 lb

## 2018-02-26 DIAGNOSIS — Z96651 Presence of right artificial knee joint: Secondary | ICD-10-CM | POA: Insufficient documentation

## 2018-02-26 DIAGNOSIS — M25561 Pain in right knee: Secondary | ICD-10-CM | POA: Diagnosis not present

## 2018-02-26 DIAGNOSIS — M25551 Pain in right hip: Secondary | ICD-10-CM | POA: Diagnosis present

## 2018-02-26 DIAGNOSIS — Z8673 Personal history of transient ischemic attack (TIA), and cerebral infarction without residual deficits: Secondary | ICD-10-CM | POA: Diagnosis not present

## 2018-02-26 DIAGNOSIS — Z87891 Personal history of nicotine dependence: Secondary | ICD-10-CM | POA: Insufficient documentation

## 2018-02-26 DIAGNOSIS — Z96641 Presence of right artificial hip joint: Secondary | ICD-10-CM | POA: Insufficient documentation

## 2018-02-26 DIAGNOSIS — N183 Chronic kidney disease, stage 3 (moderate): Secondary | ICD-10-CM | POA: Insufficient documentation

## 2018-02-26 DIAGNOSIS — I129 Hypertensive chronic kidney disease with stage 1 through stage 4 chronic kidney disease, or unspecified chronic kidney disease: Secondary | ICD-10-CM | POA: Diagnosis not present

## 2018-02-26 DIAGNOSIS — S72141S Displaced intertrochanteric fracture of right femur, sequela: Secondary | ICD-10-CM | POA: Diagnosis not present

## 2018-02-26 DIAGNOSIS — R112 Nausea with vomiting, unspecified: Secondary | ICD-10-CM | POA: Diagnosis not present

## 2018-02-26 DIAGNOSIS — J449 Chronic obstructive pulmonary disease, unspecified: Secondary | ICD-10-CM | POA: Diagnosis not present

## 2018-02-26 DIAGNOSIS — Z85828 Personal history of other malignant neoplasm of skin: Secondary | ICD-10-CM | POA: Insufficient documentation

## 2018-02-26 DIAGNOSIS — S72001S Fracture of unspecified part of neck of right femur, sequela: Secondary | ICD-10-CM | POA: Diagnosis not present

## 2018-02-26 MED ORDER — ORPHENADRINE CITRATE 30 MG/ML IJ SOLN: 60.0000 mg | Freq: Once | INTRAMUSCULAR | Status: DC

## 2018-02-26 MED ORDER — KETOROLAC TROMETHAMINE 60 MG/2ML IM SOLN: 60.0000 mg | Freq: Once | INTRAMUSCULAR | Status: DC

## 2018-02-26 MED ORDER — OXYCODONE-ACETAMINOPHEN 5-325 MG PO TABS: 1.0000 | ORAL_TABLET | Freq: Four times a day (QID) | ORAL | 0 refills | Status: DC | PRN

## 2018-02-26 MED ORDER — ONDANSETRON 4 MG PO TBDP: 4.0000 mg | ORAL_TABLET | Freq: Three times a day (TID) | ORAL | 0 refills | Status: DC | PRN

## 2018-02-26 MED ORDER — FENTANYL CITRATE (PF) 100 MCG/2ML IJ SOLN
75.0000 ug | Freq: Once | INTRAMUSCULAR | Status: AC
  Administered 2018-02-26: 75 ug via INTRAMUSCULAR
  Filled 2018-02-26: qty 2

## 2018-02-26 MED ORDER — ONDANSETRON 4 MG PO TBDP
4.0000 mg | ORAL_TABLET | Freq: Once | ORAL | Status: AC
  Administered 2018-02-26: 4 mg via ORAL
  Filled 2018-02-26: qty 1

## 2018-02-26 NOTE — Progress Notes (Signed)
Safety precautions to be maintained throughout the outpatient stay will include: orient to surroundings, keep bed in low position, maintain call bell within reach at all times, provide assistance with transfer out of bed and ambulation.  

## 2018-02-26 NOTE — Progress Notes (Signed)
Patient's Name: [name]  MRN: [id number]  Referring Provider: Wenda Low, MD  DOB: [birth year]  PCP: Wenda Low, MD  DOS: 02/26/2018  Note by: Gaspar Cola, MD  Service setting: Ambulatory outpatient  Specialty: Interventional Pain Management  Location: [facility] (AMB) Pain Management Facility    Patient type: Established   Primary Reason(s) for Visit: Evaluation of chronic illnesses with exacerbation, or progression (Level of risk: moderate) CC: Knee Pain (right)  HPI  [name] is a 77 y.o. year old, female patient, who comes today for a follow-up evaluation. 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[place]); Hypertensive kidney disease, malignant; Chronic low back pain (Tertiary Area of Pain) (Bilateral) (L>R); CAFL (chronic airflow limitation) ([place]); Apoplectic vertigo; Duodenogastric reflux; History of stroke; Hoarseness; Benign essential HTN; Chronic knee pain (Primary Area of Pain) (Right); Depression, major, in partial remission ([place]); Major depression in remission ([place]); Involutional osteoporosis; Pharyngoesophageal dysphagia; 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); Hx of total knee replacement (Bilateral); Infection of the upper respiratory tract; Bed wetting; UTI (urinary tract infection); Long term current use of opiate analgesic; Long term prescription opiate use; Chronic pain syndrome; Chronic neck pain (Secondary Area of Pain) (Bilateral) (L>R); Chronic hip pain (Right); Rib pain; DDD (degenerative disc disease), cervical; Lumbar facet arthropathy (Bilateral); Chronic myofascial pain;  Lumbar facet syndrome (Bilateral) (L>R); Long term prescription benzodiazepine use; Opiate use; Disorder of skeletal system; Pharmacologic therapy; Problems influencing health status; Opioid-induced constipation (OIC); NSAID long-term use; DDD (degenerative disc disease), lumbar; Chronic knee pain (Bilateral) (R>L); Osteoarthritis; Chronic musculoskeletal pain; Neurogenic pain; Chronic knee pain s/p total knee replacement (TKR) (Right); CKD (chronic kidney disease) stage 3, GFR 30-59 ml/min ([place]); Chronic shoulder pain (Left); Non-traumatic compression fracture of T3 thoracic vertebra, sequela; High risk medication use; At high risk for falls; Chronic sacroiliac joint pain (Right); Spondylosis without myelopathy or radiculopathy, lumbosacral region; Other specified dorsopathies, sacral and sacrococcygeal region; Abnormal x-ray of pelvis; SBO (small bowel obstruction) ([place]); History of small bowel obstruction; Delayed union of closed fracture of hip, right; Primary osteoarthritis of hip; Ileus ([place]); It band syndrome, right; History of hip replacement (Right); Closed hip fracture, sequela (Right); and Acute right hip pain on their problem list. [name] was last seen on 02/03/2018. Her primarily concern today is the Knee Pain (right)  Pain Assessment: Location: Right Knee Radiating: denies Onset: More than a month ago Duration: Chronic pain Quality: (twisting) Severity: 10-Worst pain ever/10 (subjective, self-reported pain score)  Note: Reported level is compatible with observation.                               Effect on ADL: "I cant walk" Timing: Constant Modifying factors: nothing BP: (!) 128/117  HR: 80  The patient shows him to the clinic today with an appointment with her daughter and sister, all of which are patient as of this clinic.  She was wheeled into our waiting area where she appeared to be in significant distress, screaming and crying.  She was then taken to an exam room, where as soon  as we separated them, she calm down and would seem to be in absolutely no distress.  The patient seemed to have  pain that would come and go.  Because of the patient's apparent level of distress, we initially recommended that she went to the emergency room but they told us that they already have done that and that they had done nothing.  They also indicated that she had been seen by an orthopedic provider and she was told that she may have some problems with the hardware in her right hip and femur.  Apparently she is scheduled to undergo an MRI tonight.  The daughter volunteered that her uncle had died last night.  When we moved her into the exam room, I had the opportunity to go back in look in her chart and as it turns out, the last time that they had gone to the emergency room was on 02/17/2018, and not today as they initially led [place] to believe.  Because were not set up to handle acute pain, we offered to take it down to the emergency room for management.  Strangely enough, they declined and the patient herself indicated that she was hungry and she needed to get something to eat.  The interaction today was extremely weird.  They created and scene to get into the clinic, but as soon as they were in, but her demeanor completely changed.  I cannot put my finger on it, but there is certainly something odd going on with this patient.  Further details on both, my assessment(s), as well as the proposed treatment plan, please see below.  Laboratory Chemistry  Inflammation Markers (CRP: Acute Phase) (ESR: Chronic Phase) Lab Results  Component Value Date   CRP <0.8 11/19/2016   ESRSEDRATE 26 (H) 02/09/2018   LATICACIDVEN 1.72 10/26/2017                         Renal Function Markers Lab Results  Component Value Date   BUN 16 02/09/2018   CREATININE 0.95 02/09/2018   BCR 6 (L) 12/04/2016   GFRAA >60 02/09/2018   GFRNONAA 57 (L) 02/09/2018                             Hepatic Function Markers Lab  Results  Component Value Date   AST 26 10/26/2017   ALT 16 10/26/2017   ALBUMIN 3.3 (L) 10/26/2017   ALKPHOS 96 10/26/2017   AMYLASE 68 01/25/2007   LIPASE 22 10/26/2017   AMMONIA 66 12/04/2016                        Electrolytes Lab Results  Component Value Date   NA 141 02/09/2018   K 3.7 02/09/2018   CL 108 02/09/2018   CALCIUM 9.2 02/09/2018   MG 1.7 10/30/2017                        Neuropathy Markers Lab Results  Component Value Date   VITAMINB12 1,888 (H) 11/19/2016   HGBA1C 4.9 09/19/2017                        Bone Pathology Markers Lab Results  Component Value Date   25OHVITD1 37 11/19/2016   25OHVITD2 <1.0 11/19/2016   25OHVITD3 37 11/19/2016                         Coagulation Parameters Lab Results  Component Value Date   INR  1.00 09/13/2016   LABPROT 13.2 09/13/2016   APTT 34 01/04/2011   PLT 162 02/09/2018   DDIMER 1.71 (H) 10/23/2016                        Cardiovascular Markers Lab Results  Component Value Date   BNP 60.2 01/23/2016   TROPONINI <0.03 05/23/2015   HGB 12.8 02/09/2018   HCT 40.6 02/09/2018                         Note: Lab results reviewed.  Recent Diagnostic Imaging Review  Lumbosacral Imaging: Lumbar MR wo contrast:  Results for orders placed during the hospital encounter of 03/18/17  MR LUMBAR SPINE WO CONTRAST   Narrative CLINICAL DATA:  Initial evaluation for low back pain radiating into right hip and lower extremity. Recent right hip fracture.  EXAM: MRI THORACIC AND LUMBAR SPINE WITHOUT CONTRAST  TECHNIQUE: Multiplanar and multiecho pulse sequences of the thoracic and lumbar spine were obtained without intravenous contrast.  COMPARISON:  None available.  FINDINGS: MRI THORACIC SPINE FINDINGS  Alignment: Mild exaggeration of the normal thoracic kyphosis. No listhesis or subluxation.  Vertebrae: Mild chronic height loss at the T1 through T4 vertebral bodies. Vertebral body heights otherwise  maintained. No evidence for acute for subacute fracture. Bone marrow signal intensity within normal limits. Prominent 14 mm hemangioma noted within the T6 vertebral body. No other discrete or worrisome osseous lesions.  Cord:  Signal intensity within the thoracic spinal cord is normal.  Paraspinal and other soft tissues: Paraspinous soft tissues within normal limits. Partially visualized lungs are grossly clear. Scattered T2 hyperintense cyst noted within the kidneys bilaterally.  Disc levels:  T9-10: Tiny left paracentral disc protrusion indents the ventral thecal sac (series 15, image 26). No stenosis.  T11-12: Shallow central disc protrusion mildly flattens the ventral thecal sac (series 15, image 33). No stenosis.  No other significant degenerative changes within the thoracic spine. No canal or foraminal stenosis.  MRI LUMBAR SPINE FINDINGS  Segmentation: When counting from the dens, there appear to be only 11 pairs of ribs. For the purposes of this dictation, the lowest well-formed disc space will be labeled the L5-S1 level, and the highest non rib-bearing vertebral body will be labeled L1.  Alignment: Mild rotoscoliosis. Vertebral bodies otherwise normally aligned with preservation of the normal lumbar lordosis. No listhesis.  Vertebrae: Vertebral body heights are maintained. No evidence for acute or chronic fracture. The bone marrow signal intensity within normal limits. No discrete or worrisome osseous lesions. No abnormal marrow edema.  Conus medullaris: Extends to the L1-2 level and appears normal.  Paraspinal and other soft tissues: Paraspinous soft tissues demonstrate no acute abnormality. Scattered T2 hyperintense cyst noted within the kidneys, greater on the left. Visualized visceral structures otherwise unremarkable.  Disc levels:  L1-2:  Unremarkable.  L2-3:  Unremarkable.  L3-4: Diffuse degenerative disc bulge with disc desiccation. Annular fissure  noted at the level of the right neural foramen. Moderate facet and ligamentum flavum hypertrophy. Reactive effusions present within the bilateral L3-4 facets. Mild canal and bilateral foraminal stenosis without neural impingement.  L4-5: Mild disc bulge with disc desiccation. Small posterior annular fissure noted. Mild facet and ligamentum flavum hypertrophy. No significance canal stenosis. Mild bilateral L4 foraminal narrowing without impingement.  L5-S1: Normal disc. Mild facet hypertrophy. No significant stenosis.  IMPRESSION: MR THORACIC SPINE IMPRESSION  1. Mild degenerative disc bulging at T9-10 and T11-12 without  significant stenosis. 2. Otherwise unremarkable MRI of the thoracic spine. No other significant disc pathology. No canal or neural foraminal stenosis.  MR LUMBAR SPINE IMPRESSION  1. Degenerative disc bulge with facet hypertrophy at L3-4 with resultant mild canal and bilateral foraminal stenosis. 2. Degenerative disc bulge at L4-5 with resultant mild bilateral L4 foraminal narrowing. 3. Otherwise negative MRI of the lumbar spine. No other significant stenosis or evidence for neural impingement.   Electronically Signed   By: Jeannine Boga M.D.   On: 03/18/2017 15:33    Hip-R CT wo contrast:  Results for orders placed during the hospital encounter of 02/17/18  CT Hip Right Wo Contrast   Narrative CLINICAL DATA:  77 year old female with worsening right hip pain since hip replacement.  EXAM: CT OF THE RIGHT HIP WITHOUT CONTRAST  TECHNIQUE: Multidetector CT imaging of the right hip was performed according to the standard protocol. Multiplanar CT image reconstructions were also generated.  COMPARISON:  Right hip radiograph dated 02/09/2018 and CT of the abdomen pelvis dated 10/26/2017.  FINDINGS: Evaluation of this exam is limited in the absence of intravenous contrast. Evaluation is also limited due to streak artifact caused by metallic  arthroplasty.  Bones/Joint/Cartilage  There is a total right hip arthroplasty which appears intact and in anatomic alignment. There is satisfactory contact between the arthroplasty and bone. There is no acute fracture or dislocation. There is soft tissue thickening around the neck of the arthroplasty which may represent effusion. Evaluation of this area is limited due to streak artifact caused by arthroplasty. Ultrasound may provide better evaluation.  Ligaments  Suboptimally assessed by CT.  Muscles and Tendons  No acute findings.  No intramuscular hematoma.  Soft tissues  Right hip subcutaneous scarring, postsurgical.  No fluid collection.  IMPRESSION: 1. No acute fracture or dislocation. 2. Right hip arthroplasty appears intact with no evidence of loosening. There may fluid within the joint adjacent to the neck of the arthroplasty. Ultrasound may provide better evaluation.   Electronically Signed   By: Anner Crete M.D.   On: 02/17/2018 06:11    Hip-R DG 2-3 views:  Results for orders placed during the hospital encounter of 02/09/18  DG Hip Unilat W or Wo Pelvis 2-3 Views Right   Narrative CLINICAL DATA:  Status post right hip joint replacement 5 months ago. Severe onset of right hip and leg pain today. No recent fall.  EXAM: DG HIP (WITH OR WITHOUT PELVIS) 2-3V RIGHT  COMPARISON:  Right hip series of October 27, 2017  FINDINGS: The bony pelvis is subjectively osteopenic. There is no acute or healing pelvic fracture. AP and lateral views of the prosthetic left hip reveal radiographic positioning of the prosthetic components to be good. The interface with the native bone appears normal.  IMPRESSION: There is no acute abnormality of the left hip prosthesis nor of the native bone. The observed portions of the pelvis exhibit no acute abnormalities.   Electronically Signed   By: David  Martinique M.D.   On: 02/09/2018 14:33    Complexity Note: Imaging results  reviewed.                         Meds   Current Outpatient Medications:  .  albuterol (ACCUNEB) 1.25 MG/3ML nebulizer solution, Take 1 ampule by nebulization every 6 (six) hours as needed for wheezing., Disp: , Rfl:  .  albuterol (PROVENTIL HFA;VENTOLIN HFA) 108 (90 BASE) MCG/ACT inhaler, Inhale 2 puffs into the lungs  every 6 (six) hours as needed for wheezing or shortness of breath. , Disp: , Rfl:  .  alendronate (FOSAMAX) 70 MG tablet, Take 70 mg by mouth every Monday. Take with a full glass of water on an empty stomach., Disp: , Rfl:  .  ALPRAZolam (XANAX) 1 MG tablet, Take 1 mg by mouth at bedtime as needed for sleep., Disp: , Rfl:  .  amLODipine (NORVASC) 5 MG tablet, Take 5 mg by mouth daily. , Disp: , Rfl:  .  busPIRone (BUSPAR) 7.5 MG tablet, Take 7.5 mg by mouth daily as needed (anxiety, relaxation, sleep)., Disp: , Rfl:  .  Calcium-Phosphorus-Vitamin D (CALCIUM/D3 ADULT GUMMIES PO), Take 1 tablet by mouth daily., Disp: , Rfl:  .  citalopram (CELEXA) 20 MG tablet, Take 20 mg by mouth daily. , Disp: , Rfl:  .  Dextromethorphan-guaiFENesin (TUSSIN DM MAX PO), Take 5 mLs by mouth 2 (two) times daily as needed (cough)., Disp: , Rfl:  .  docusate sodium (COLACE) 100 MG capsule, Take 200 mg by mouth 2 (two) times daily as needed for moderate constipation. , Disp: , Rfl:  .  esomeprazole (NEXIUM) 40 MG capsule, Take 40 mg by mouth daily. , Disp: , Rfl:  .  Ferrous Sulfate (IRON) 325 (65 Fe) MG TABS, Take 1 tablet (325 mg total) by mouth 2 (two) times daily. (Patient taking differently: Take 1 tablet by mouth 2 (two) times a week. ), Disp: 60 each, Rfl: 0 .  gabapentin (NEURONTIN) 400 MG capsule, Take 400 mg by mouth 3 (three) times daily., Disp: , Rfl:  .  guaiFENesin (MUCINEX) 600 MG 12 hr tablet, Take 600 mg by mouth 2 (two) times daily as needed for cough., Disp: , Rfl:  .  linaclotide (LINZESS) 290 MCG CAPS capsule, Take 290 mcg by mouth daily., Disp: , Rfl:  .  lovastatin (MEVACOR) 10 MG  tablet, Take 10 mg by mouth daily., Disp: , Rfl:  .  meloxicam (MOBIC) 15 MG tablet, Take 15 mg by mouth daily. , Disp: , Rfl:  .  Menthol, Topical Analgesic, (BIOFREEZE EX), Apply 1 application topically daily as needed (muscle pain)., Disp: , Rfl:  .  Multiple Vitamins-Minerals (MULTIVITAMIN GUMMIES ADULT PO), Take 1 tablet by mouth daily., Disp: , Rfl:  .  ondansetron (ZOFRAN-ODT) 8 MG disintegrating tablet, Take 8 mg by mouth every 8 (eight) hours as needed for nausea or vomiting., Disp: , Rfl:  .  orphenadrine (NORFLEX) 100 MG tablet, Take 1 tablet (100 mg total) by mouth 2 (two) times daily., Disp: 60 tablet, Rfl: 2 .  oxyCODONE (OXY IR/ROXICODONE) 5 MG immediate release tablet, Take 1 tablet (5 mg total) by mouth every 8 (eight) hours as needed for severe pain., Disp: 90 tablet, Rfl: 0 .  polyethylene glycol (MIRALAX / GLYCOLAX) packet, Take 17 g by mouth daily as needed (for constipation.). , Disp: , Rfl:  .  pregabalin (LYRICA) 75 MG capsule, Take 1 capsule (75 mg total) by mouth 2 (two) times daily., Disp: 60 capsule, Rfl: 0 .  traZODone (DESYREL) 100 MG tablet, Take 100 mg by mouth at bedtime. , Disp: , Rfl:   ROS  Constitutional: Denies any fever or chills Gastrointestinal: No reported hemesis, hematochezia, vomiting, or acute GI distress Musculoskeletal: Denies any acute onset joint swelling, redness, loss of ROM, or weakness Neurological: No reported episodes of acute onset apraxia, aphasia, dysarthria, agnosia, amnesia, paralysis, loss of coordination, or loss of consciousness  Allergies  [name] is allergic to  ampicillin; ampicillin-sulbactam sodium; ambien [zolpidem tartrate]; flexeril [cyclobenzaprine]; tape; toradol [ketorolac tromethamine]; sulfamethoxazole-trimethoprim; zolpidem; cephalexin; and tramadol.  Constitutional Exam  General appearance: alert, anxious, oriented, in moderate distress, well nourished and well hydrated. Vitals:   02/26/18 1336  BP: (!) 128/117   Pulse: 80  Resp: (!) 22  Temp: 98.7 F (37.1 C)  SpO2: 96%  Weight: 168 lb (76.2 kg)  Height: _0  (1.626 m)   BMI Assessment: Estimated body mass index is 28.84 kg/m as calculated from the following:   Height as of this encounter: _1  (1.626 m).   Weight as of this encounter: 168 lb (76.2 kg).  Psych/Mental status: Alert, oriented x 3 (person, place, & time)       Eyes: PERLA Respiratory: No evidence of acute respiratory distress  Gait & Posture Assessment  Ambulation: Patient came in today in a wheel chair Gait: Significantly limited. Dependent on assistive device to ambulate Posture: Antalgic   Lower Extremity Exam    Side: Right lower extremity  Side: Left lower extremity  Skin & Extremity Inspection: Evidence of prior arthroplastic surgery  Skin & Extremity Inspection: Skin color, temperature, and hair growth are WNL. No peripheral edema or cyanosis. No masses, redness, swelling, asymmetry, or associated skin lesions. No contractures.  Functional ROM: Unrestricted ROM                  Functional ROM: Unrestricted ROM                  Muscle Tone/Strength: Guarding  Muscle Tone/Strength: Functionally intact. No obvious neuro-muscular anomalies detected.  Sensory (Neurological): Patient indicates having pain over the lateral aspect of her right thigh.  Sensory (Neurological): Unimpaired   Assessment  Primary Diagnosis & Pertinent Problem List: The primary encounter diagnosis was Acute right hip pain. 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  Status Diagnosis  Persistent Stable Stable 1. Acute right hip pain   2. Closed hip fracture, sequela (Right)   3. Intertrochanteric fracture of femur, sequela (Right)   4. History of hip replacement (Right)   5. S/P revision of total  replacement of knee (Right)     Problems updated and reviewed during this visit: Problem  Acute Right Hip Pain   Plan of Care  Medications administered today: [name] had no medications administered during this visit.  Interventional management: Recent treatments:   Therapeutic right-sided genicular nerve RFA  done on 02/03/2018, without any complications   Considering:   Diagnostic right-sided genicular nerve block #2 Possible right-sided genicular nerve RFA Diagnostic right Femoral + Obturator nerve block #1  Possible right Femoral + Obturator nerve RFA  Diagnostic left cervical epidural steroid injection Diagnostic bilateral cervical facet blocks Diagnostic bilateral intra-articular shoulder joint injection Diagnostic bilateral suprascapular nerve block Possible bilateral suprascapular nerve RFA Diagnostic right-sided lumbar facet block #3 Possible right-sided lumbar facet RFA Diagnostic right-sided sacroiliac joint block #3  Possible right-sided sacroiliac joint RFA    Palliative PRN treatment(s):   None at this time   Provider-requested follow-up: No follow-ups on file.  Future Appointments  Date Time Provider [place]  03/05/2018  9:00 AM Vevelyn Francois, NP [facility] None  03/06/2018 11:00 AM [facility] 1 [facility]  03/06/2018  2:00 PM [facility] 3 [facility]  03/17/2018 11:15 AM Vevelyn Francois, NP [facility] None  04/29/2018  1:30 PM Penumalli, Earlean Polka, MD [facility] None   Primary Care Physician: Wenda Low, MD Location: [facility] Note by: Gaspar Cola, MD Date: 02/26/2018; Time: 4:26 PM

## 2018-02-26 NOTE — ED Notes (Addendum)
Pt able to move leg freely and without any problems immediately after giving pain medication. Pt able to drink water without any problems as well. MD notified.

## 2018-02-26 NOTE — Discharge Instructions (Addendum)
Stop taking hydrocodone, as this medication is not helping you.  You may take Percocet for severe pain.  This medication can make you sleepy so please make sure to take all precautions to prevent falls.  You may take Zofran for nausea or vomiting.  Return to the emergency department if you develop severe pain, swelling or redness over your joints, fevers or chills, lightheadedness or fainting, numbness tingling or weakness, falls, or any other symptoms concerning to you.

## 2018-02-26 NOTE — Progress Notes (Addendum)
Nursing Pain Medication Assessment:  Safety precautions to be maintained throughout the outpatient stay will include: orient to surroundings, keep bed in low position, maintain call bell within reach at all times, provide assistance with transfer out of bed and ambulation.  Medication Inspection Compliance: Pill count conducted under aseptic conditions, in front of the patient. Neither the pills nor the bottle was removed from the patient's sight at any time. Once count was completed pills were immediately returned to the patient in their original bottle.  Medication: Hydrocodone/APAP Pill/Patch Count: 0 of 21 pills remain Pill/Patch Appearance: Markings consistent with prescribed medication Bottle Appearance: Standard pharmacy container. Clearly labeled. Filled Date: 09 / 23/ 2019 Last Medication intake:  Yesterday    Patient showed up in the lobby with daughter and sister.  Patient screaming and crying.  Brought into admission room and patient continues to scream and cry.  Asked daughter and sister to step out of the room.  They were not happy but they stepped out.  Admission questions asked.  Patient calmed down and was able to answer questions.  Had periods  where was laughing, and then would start moaning and crying, but would be quickly calmed.  States she has been having pain for 5 months.  States  That the bone is coming out of her leg and pointed to an area on the right knee.  Patient then started laughing and talking about her puppy.  States she is out of medicine and nothing is helping. Daughter states that she does not take the pain meds like she is supposed to because nothing helps.   States that they had been to the ED but they did nothing.  Encounter date shows 02-17-18.  Patient informed to go to the ED again for acute assessment.  Patient states she need to eat. Offerred to take to the ED but daughter states that they need to take sister home and then she will bring her back to the  Emergency room.  Daughter states that there isnt but 3 reasons none of her pain meds would work and that they had narrowed it down to the stem moving in her femur.  There is an MRI ordered for this pm.  Patient was wheeled out by daughter.  No screaming or crying noted at the time of discharge.

## 2018-02-26 NOTE — ED Provider Notes (Signed)
[facility] Emergency Department Provider Note  ____________________________________________  Time seen: Approximately 7:16 PM  I have reviewed the triage vital signs and the nursing notes.   HISTORY  Chief Complaint Leg Pain    HPI [name] is a 77 y.o. female with a history of right hip pain and then arthroplasty remotely, right total knee replacement, presenting for acute on chronic pain.  The patient is unable to give any of her history and the entirety of her history is obtained from her daughter [name], with whom I spoke over the phone.  The patient is able to give me answers regarding her current pain.  The patient has had chronic pain in her right lower extremity for years, and now uses a walker that she sits on for ambulation.  She is unable to bear weight on her right lower extremity.  I reviewed her medical chart, and she was evaluated 02/09/2018 in the emergency department with right hip aspiration which did not show a septic joint.  She has been following with Dr. Lou Miner, her orthopedist, who plans to have her undergo MRI and bone scan on Friday for evaluation of possible loosening of her right hip hardware, which could be causing her pain.  She has previously been followed by Dr. Jobe Marker in the pain clinic and treated with 5 mg hydrocodone as needed, which has not helped her pain.  Today, there is nothing new or different about the patient's pain; and she is not having any systemic illness including fever or chills.  She occasionally does have vomiting due to pain, which is not new.  The patient and her daughter's main goal from being seen in the emergency department is pain control plan until she is able to get her follow-up studies with Dr. Percell Miller; they did attempt to go to the pain clinic today but were unable to be seen.  Past Medical History:  Diagnosis Date  . Acute postoperative pain 02/03/2018  . Anemia   . B12 deficiency   . Bacteremia  10/07/2014  . Bladder incontinence   . Blind left eye   . Cancer ([place])    skin cancer   . Cataract   . Cervical spine fracture ([place])   . Chest pain 07/29/2013  . Compression fracture    C7,  upper T spine -compression fx  . COPD (chronic obstructive pulmonary disease) ([place])   . DDD (degenerative disc disease), lumbar   . Depression    major  . Diffuse myofascial pain syndrome 03/24/2015  . Dyspnea    at times- when activty  . Fever 10/05/2014  . GERD (gastroesophageal reflux disease)   . Hiatal hernia   . Hyperlipidemia   . Hypertension   . 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 Macular degeneration   . Osteoarthritis   . Osteoporosis   . Pneumonia    years ago  . Reactive airway disease   . Rupture of bowel ([place])   . Sepsis ([place]) 10/08/2014  . Stroke [facility])    "mini stroke"- balance and memory issue  . Stroke ([place])   . Wrist pain, acute 09/10/2012    Patient Active Problem List   Diagnosis Date Noted  . Acute right hip pain 02/26/2018  . Closed hip fracture, sequela (Right) 02/03/2018  . It band syndrome, right 12/18/2017  . History of hip replacement (Right) 12/18/2017  . Ileus ([place]) 10/27/2017  . Primary osteoarthritis of hip 09/30/2017  . History of small bowel obstruction  09/10/2017  . 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 SBO (small bowel obstruction) ([place]) 08/24/2017  . Abnormal x-ray of pelvis 08/13/2017  . Other specified dorsopathies, sacral and sacrococcygeal region 08/04/2017  . Spondylosis without myelopathy or radiculopathy, lumbosacral region 07/17/2017  . Non-traumatic compression fracture of T3 thoracic vertebra, sequela 07/02/2017  . High risk medication use 07/02/2017  . At high risk for falls 07/02/2017  . Chronic sacroiliac joint pain (Right) 07/02/2017  . CKD (chronic kidney disease) stage 3, GFR 30-59 ml/min (HCC) 04/23/2017  . Chronic knee pain s/p total knee replacement (TKR) (Right) 04/02/2017  . DDD  (degenerative disc disease), lumbar 03/05/2017  . Chronic knee pain (Bilateral) (R>L) 03/05/2017  . Osteoarthritis 03/05/2017  . Chronic musculoskeletal pain 03/05/2017  . Neurogenic pain 03/05/2017  . Chronic myofascial pain 02/12/2017  . Lumbar facet syndrome (Bilateral) (L>R) 02/12/2017  . Long term prescription benzodiazepine use 02/12/2017  . Opiate use 02/12/2017  . Disorder of skeletal system 02/12/2017  . Pharmacologic therapy 02/12/2017  . Problems influencing health status 02/12/2017  . 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 NSAID long-term use 02/12/2017  . DDD (degenerative disc disease), cervical 11/11/2016  . Lumbar facet arthropathy (Bilateral) 11/11/2016  . 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 CAFL (chronic airflow limitation) ([place]) 11/07/2016  . Depression, major, in partial remission ([place]) 11/07/2016  . 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 Bed wetting 11/07/2016  . Long term current use of opiate analgesic 11/07/2016  . Long term prescription opiate use 11/07/2016  . Chronic pain syndrome 11/07/2016  . Chronic neck pain (Secondary Area of Pain) (Bilateral) (L>R) 11/07/2016  . Chronic hip pain (Right) 11/07/2016  . Rib pain 11/07/2016  . Age-related osteoporosis with current pathological fracture with routine healing 09/20/2016  . History of stroke 09/20/2016  . Major depression in remission ([place]) 09/20/2016  . Intertrochanteric fracture of femur, sequela (Right) 09/13/2016  . Fall   . Chronic shoulder pain (Bilateral) 08/12/2016  . Hx of total knee replacement (Bilateral) 04/04/2016  . Chronic shoulder pain (Left) 02/28/2016  . Hoarseness 01/12/2016  . Pharyngoesophageal dysphagia 01/12/2016  . Chronic low back pain [facility] Area of Pain) (Bilateral) (L>R) 10/26/2015  . 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 UTI (urinary  tract infection) 06/25/2015  . Syncope, non cardiac 05/22/2015  . Leukopenia 10/07/2014  . Thrombocytopenia ([place]) 10/07/2014  . Hypoxia 10/05/2014  . Anxiety 06/15/2014  . Apoplectic vertigo 06/15/2014  . Duodenogastric reflux 06/15/2014  . Benign essential HTN 06/15/2014  . Infection of the upper respiratory tract 06/15/2014  . Hypercholesteremia 07/30/2013  . HTN (hypertension) 07/30/2013  . GERD (gastroesophageal reflux disease) 07/30/2013  . Dizziness and giddiness 06/18/2013  . Memory loss 05/03/2013  . Chronic knee pain (Primary Area of Pain) (Right) 09/10/2012    Past Surgical History:  Procedure Laterality Date  . ABDOMINAL HYSTERECTOMY    . ABDOMINAL SURGERY    . APPENDECTOMY    . BLADDER SURGERY    . CHOLECYSTECTOMY    . COLON SURGERY    . COLOSTOMY REVERSAL    . EYE SURGERY     cataracts  . HIP SURGERY Right 08/2016  . INTRAMEDULLARY (IM) NAIL INTERTROCHANTERIC Right 09/13/2016   Procedure: INTRAMEDULLARY (IM) NAIL INTERTROCHANTRIC RIGHT;  Surgeon: Leandrew Koyanagi, MD;  Location: [facility];  Service: Orthopedics;  Laterality: Right;  . JOINT REPLACEMENT Bilateral   .  KNEE SURGERY    . OSTOMY    . RECTOCELE REPAIR    . TOE SURGERY Right    3rd  . TOTAL HIP ARTHROPLASTY Right 09/30/2017   Procedure: RIGHT HIP IM NAIL REMOVAL WITH CONVERSION TO TOTAL HIP ARTHOPLASTY, anterior approach;  Surgeon: Renette Butters, MD;  Location: [facility];  Service: Orthopedics;  Laterality: Right;    Current Outpatient Rx  . Order #: [id number] Class: Historical Med  . Order #: [id number] Class: Historical Med  . Order #: [id number] Class: Historical Med  . Order #: [id number] Class: Historical Med  . Order #: [id number] Class: Historical Med  . Order #: [id number] Class: Historical Med  . Order #: [id number] Class: Historical Med  . Order #: [id number] Class: Historical Med  . Order #: [id number] Class: Historical Med  . Order #: [id number] Class: Historical Med  . Order #: [id number] Class: Historical Med   . Order #: [id number] Class: Print  . Order #: [id number] Class: Historical Med  . Order #: [id number] Class: Historical Med  . Order #: [id number] Class: Historical Med  . Order #: [id number] Class: Historical Med  . Order #: [id number] Class: Historical Med  . Order #: [id number] Class: Historical Med  . Order #: [id number] Class: Historical Med  . Order #: [id number] Class: Normal  . Order #: [id number] Class: Normal  . Order #: [id number] Class: Print  . Order #: [id number] Class: Print  . Order #: [id number] Class: Historical Med  . Order #: [id number] Class: Print  . Order #: [id number] Class: Historical Med    Allergies Ampicillin; Ampicillin-sulbactam sodium; Ambien [zolpidem tartrate]; Flexeril [cyclobenzaprine]; Tape; Toradol [ketorolac tromethamine]; Sulfamethoxazole-trimethoprim; Zolpidem; Cephalexin; and Tramadol  Family History  Problem Relation Age of Onset  . Heart failure Mother   . Heart attack Father     Social History Social History   Tobacco Use  . Smoking status: Former Smoker    Packs/day: 1.50    Years: 30.00    Pack years: 45.00    Types: E-cigarettes, Cigarettes    Last attempt to quit: 2013    Years since quitting: 6.7  . Smokeless tobacco: Never Used  Substance Use Topics  . Alcohol use: No    Alcohol/week: 0.0 standard drinks  . Drug use: No    Review of Systems Constitutional: No fever/chills.  No lightheadedness or syncope. ENT:  No congestion or rhinorrhea. Cardiovascular: Denies chest pain. Denies palpitations. Respiratory: Denies shortness of breath.  No cough. Gastrointestinal: No abdominal pain.  Intermittent nausea and vomiting due to pain, which is unchanged..  No diarrhea.  No constipation. Genitourinary: Negative for dysuria. Musculoskeletal: Positive for right hip and right knee pain. no new swelling, erythema.  No new trauma. Skin: Negative for rash. Neurological: Negative for headaches. No focal numbness, tingling or weakness.      ____________________________________________   PHYSICAL EXAM:  VITAL SIGNS: ED Triage Vitals  Enc Vitals Group     BP 02/26/18 1826 129/89     Pulse Rate 02/26/18 1826 89     Resp 02/26/18 1826 16     Temp 02/26/18 1826 97.7 F (36.5 C)     Temp Source 02/26/18 1826 Oral     SpO2 02/26/18 1826 95 %     Weight 02/26/18 1828 167 lb 15.9 oz (76.2 kg)     Height 02/26/18 1828 5\' 4"  (1.626 m)     Head Circumference --      Peak Flow --      Pain Score 02/26/18 1827 10     Pain Loc --      Pain Edu? --  Excl. in [place]? --     Constitutional: Alert and oriented.  Answers questions appropriately.  Chronically ill-appearing. Eyes: Conjunctivae are normal.  EOMI. No scleral icterus. Head: Atraumatic. Nose: No congestion/rhinnorhea. Mouth/Throat: Mucous membranes are moist.  Neck: No stridor.  Supple.  No JVD.  No meningismus. Cardiovascular: Normal rate, regular rhythm. No murmurs, rubs or gallops.  Respiratory: Normal respiratory effort.  No accessory muscle use or retractions. Lungs CTAB.  No wheezes, rales or ronchi. Gastrointestinal: Soft, nontender and nondistended.  No guarding or rebound.  No peritoneal signs. Musculoskeletal: Pelvis is stable.  Full range of motion of the left hip, knee, and bilateral ankles without pain.  The patient has pain with palpation over the greater trochanter on the right, and pain with any range of motion of the right knee.  The patient has an overlying scar on the right knee without any surrounding swelling, erythema, or effusion.  No LE edema. No ttp in the calves or palpable cords.  Negative Homan's sign. Neurologic: alert.  Speech is clear.  Face and smile are symmetric.  EOMI.  Moves all extremities well. Skin:  Skin is warm, dry and intact. No rash noted. Psychiatric: Mood and affect are normal.   ____________________________________________   LABS (all labs ordered are listed, but only abnormal results are displayed)  Labs Reviewed  - No data to display ____________________________________________  EKG  Not indicated ____________________________________________  RADIOLOGY  No results found.  ____________________________________________   PROCEDURES  Procedure(s) performed: None  Procedures  Critical Care performed: No ____________________________________________   INITIAL IMPRESSION / ASSESSMENT AND PLAN / ED COURSE  Pertinent labs & imaging results that were available during my care of the patient were reviewed by me and considered in my medical decision making (see chart for details).  77 y.o. female with a long history of chronic right hip and knee pain presenting for acute on chronic pain without any new symptoms, or red flag symptoms.  Overall, the patient is hemodynamically stable.  On my examination, she does have pain, but reports is stable for her.  It appears that her outpatient pain regimen is not addressing her pain, and I have spoken to the patient and her daughter about an acute pain management plan until she is able to get her follow-up with her orthopedic physician.  Today, the patient will receive intramuscular Dilaudid, and she will be discharged home with 20 tablets of Percocet to use every 6-8 hours for her pain.  She will also receive Zofran for any nausea.  There is no indication of a new process today, and I do not suspect a septic joint, new fracture.  Follow-up instructions as well as return precautions were discussed.  ____________________________________________  FINAL CLINICAL IMPRESSION(S) / ED DIAGNOSES  Final diagnoses:  Right hip pain  Acute pain of right knee  Non-intractable vomiting with nausea, unspecified vomiting type         NEW MEDICATIONS STARTED DURING THIS VISIT:  New Prescriptions   ONDANSETRON (ZOFRAN ODT) 4 MG DISINTEGRATING TABLET    Take 1 tablet (4 mg total) by mouth every 8 (eight) hours as needed for nausea or vomiting.   OXYCODONE-ACETAMINOPHEN  (PERCOCET) 5-325 MG TABLET    Take 1 tablet by mouth every 6 (six) hours as needed for severe pain.      Eula Listen, MD 02/26/18 [phone]

## 2018-02-26 NOTE — ED Triage Notes (Signed)
Pt arrived via EMS c/o rt leg pain states that she was seen at pain management clinic today. No recent injuries or falls.

## 2018-02-27 ENCOUNTER — Telehealth: Payer: Self-pay | Admitting: Pain Medicine

## 2018-02-27 NOTE — Telephone Encounter (Signed)
[employer]vmail stating they went to ED yesterday, got a shot and oxycodone 5mg  to take every 8 hours. Not touching the pain. Patient not taking meds as prescribed. Screaming with pain. Has been to ED multiple times, this is not helping. Patient wants to be fixed. If she can get in sooner than the 10th please call

## 2018-03-02 NOTE — Telephone Encounter (Signed)
Voicemail left with patient that I have reviewed her ED note where she was given a pain injection as well as additional pain medications. And to f/up with orthopedics.

## 2018-03-03 ENCOUNTER — Encounter (HOSPITAL_COMMUNITY): Payer: Medicare Other

## 2018-03-05 ENCOUNTER — Ambulatory Visit: Payer: Medicare Other | Attending: Nurse Practitioner | Admitting: Nurse Practitioner

## 2018-03-05 ENCOUNTER — Encounter: Payer: Self-pay | Admitting: Nurse Practitioner

## 2018-03-05 ENCOUNTER — Other Ambulatory Visit: Payer: Self-pay

## 2018-03-05 VITALS — BP 155/99 | HR 50 | Temp 98.0°F | Resp 16 | Ht 65.0 in | Wt 170.0 lb

## 2018-03-05 DIAGNOSIS — Z881 Allergy status to other antibiotic agents status: Secondary | ICD-10-CM | POA: Diagnosis not present

## 2018-03-05 DIAGNOSIS — M25561 Pain in right knee: Secondary | ICD-10-CM | POA: Diagnosis not present

## 2018-03-05 DIAGNOSIS — G8929 Other chronic pain: Secondary | ICD-10-CM

## 2018-03-05 DIAGNOSIS — Z87891 Personal history of nicotine dependence: Secondary | ICD-10-CM | POA: Diagnosis not present

## 2018-03-05 DIAGNOSIS — Z79899 Other long term (current) drug therapy: Secondary | ICD-10-CM | POA: Insufficient documentation

## 2018-03-05 DIAGNOSIS — Z880 Allergy status to penicillin: Secondary | ICD-10-CM | POA: Insufficient documentation

## 2018-03-05 DIAGNOSIS — J440 Chronic obstructive pulmonary disease with acute lower respiratory infection: Secondary | ICD-10-CM | POA: Insufficient documentation

## 2018-03-05 DIAGNOSIS — Z79891 Long term (current) use of opiate analgesic: Secondary | ICD-10-CM | POA: Diagnosis not present

## 2018-03-05 DIAGNOSIS — Z8673 Personal history of transient ischemic attack (TIA), and cerebral infarction without residual deficits: Secondary | ICD-10-CM | POA: Insufficient documentation

## 2018-03-05 DIAGNOSIS — I129 Hypertensive chronic kidney disease with stage 1 through stage 4 chronic kidney disease, or unspecified chronic kidney disease: Secondary | ICD-10-CM | POA: Insufficient documentation

## 2018-03-05 DIAGNOSIS — M25562 Pain in left knee: Secondary | ICD-10-CM | POA: Diagnosis not present

## 2018-03-05 DIAGNOSIS — F419 Anxiety disorder, unspecified: Secondary | ICD-10-CM | POA: Diagnosis not present

## 2018-03-05 DIAGNOSIS — K449 Diaphragmatic hernia without obstruction or gangrene: Secondary | ICD-10-CM | POA: Diagnosis not present

## 2018-03-05 DIAGNOSIS — D649 Anemia, unspecified: Secondary | ICD-10-CM | POA: Diagnosis not present

## 2018-03-05 DIAGNOSIS — K219 Gastro-esophageal reflux disease without esophagitis: Secondary | ICD-10-CM | POA: Diagnosis not present

## 2018-03-05 DIAGNOSIS — N183 Chronic kidney disease, stage 3 (moderate): Secondary | ICD-10-CM | POA: Diagnosis not present

## 2018-03-05 DIAGNOSIS — M47816 Spondylosis without myelopathy or radiculopathy, lumbar region: Secondary | ICD-10-CM

## 2018-03-05 DIAGNOSIS — G894 Chronic pain syndrome: Secondary | ICD-10-CM | POA: Diagnosis not present

## 2018-03-05 DIAGNOSIS — Z888 Allergy status to other drugs, medicaments and biological substances status: Secondary | ICD-10-CM | POA: Insufficient documentation

## 2018-03-05 DIAGNOSIS — F329 Major depressive disorder, single episode, unspecified: Secondary | ICD-10-CM | POA: Diagnosis not present

## 2018-03-05 DIAGNOSIS — Z5181 Encounter for therapeutic drug level monitoring: Secondary | ICD-10-CM | POA: Diagnosis present

## 2018-03-05 DIAGNOSIS — M25551 Pain in right hip: Secondary | ICD-10-CM

## 2018-03-05 DIAGNOSIS — M1611 Unilateral primary osteoarthritis, right hip: Secondary | ICD-10-CM | POA: Insufficient documentation

## 2018-03-05 DIAGNOSIS — M533 Sacrococcygeal disorders, not elsewhere classified: Secondary | ICD-10-CM | POA: Insufficient documentation

## 2018-03-05 DIAGNOSIS — Z882 Allergy status to sulfonamides status: Secondary | ICD-10-CM | POA: Insufficient documentation

## 2018-03-05 DIAGNOSIS — Z96641 Presence of right artificial hip joint: Secondary | ICD-10-CM | POA: Insufficient documentation

## 2018-03-05 DIAGNOSIS — E7800 Pure hypercholesterolemia, unspecified: Secondary | ICD-10-CM | POA: Diagnosis not present

## 2018-03-05 DIAGNOSIS — M5136 Other intervertebral disc degeneration, lumbar region: Secondary | ICD-10-CM | POA: Insufficient documentation

## 2018-03-05 NOTE — Progress Notes (Addendum)
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 Family: family history includes Heart attack in her father; Heart failure in her mother.  Constitutional Exam  General appearance: Well nourished, well developed, and well hydrated. In no apparent acute distress Vitals:   03/05/18 0915  BP: (!) 155/99  Pulse: (!) 50  Resp: 16  Temp: 98 F (36.7 C)  TempSrc: Oral  SpO2: 98%  Weight: 170 lb (77.1 kg)  Height: '5\' 5"'  (1.651 m)   BMI Assessment: Estimated body mass index is 28.29 kg/m as calculated from the following:   Height as of this encounter: '5\' 5"'  (1.651 m).   Weight as of this encounter: 170 lb (77.1 kg). Psych/Mental status: Alert, oriented x 3 (person, place, & time)       Eyes: PERLA Respiratory: No evidence of acute respiratory distress  Gait & Posture Assessment  Ambulation: Patient ambulates using a wheel chair Gait: Relatively normal for age and body habitus Posture: WNL   Lower Extremity Exam    Side: Right lower extremity  Side: Left lower extremity  Stability: No  instability observed          Stability: No instability observed          Skin & Extremity Inspection: Evidence of prior arthroplastic surgery  Skin & Extremity Inspection: Skin color, temperature, and hair growth are WNL. No peripheral edema or cyanosis. No masses, redness, swelling, asymmetry, or associated skin lesions. No contractures.  Functional ROM: Adequate ROM for hip and knee joints          Functional ROM: Unrestricted ROM                  Muscle Tone/Strength: Functionally intact. No obvious neuro-muscular anomalies detected.  Muscle Tone/Strength: Functionally intact. No obvious neuro-muscular anomalies detected.  Sensory (Neurological): Unimpaired  Sensory (Neurological): Unimpaired  Palpation: No palpable anomalies  Palpation: No palpable anomalies   Assessment  Primary Diagnosis & Pertinent Problem List: The primary encounter diagnosis was Lumbar facet arthropathy (Bilateral). Diagnoses of Chronic knee pain (Bilateral) (R>L), Chronic hip pain (Right), Chronic pain syndrome, and Long term current use of opiate analgesic were also pertinent to this visit.  Status Diagnosis  Controlled Persistent Persistent 1. Lumbar facet arthropathy (Bilateral)   2. 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 Chronic hip pain (Right)   4. Chronic pain syndrome   5. Long term current use of opiate analgesic     Problems updated and reviewed during this visit: No problems updated. Plan of Care  Pharmacotherapy (Medications Ordered): No orders of the defined types were placed in this encounter.  New Prescriptions   No medications on file   Medications administered today: Cannie L. Brogan had no medications administered during this visit. Lab-work, procedure(s), and/or referral(s): No orders of the defined types were placed in this encounter.  Imaging and/or referral(s): None  Interventional therapies: Planned, scheduled, and/or pending:   Not at this time.  Therapeutic right-sided  genicular nerve RFA done on 02/03/2018, without any complicationsPatient was made aware that her pain is currently related to her surgery and this is been handled by orthopedist.  No medications given on this visit secondary to the above indications   Considering:   Diagnostic right-sided genicular nerve block#2 Possible right-sided genicular nerve RFA Diagnostic right Femoral + Obturator nerve  block #1 Possible right Femoral + Obturator nerve RFA Diagnostic left cervical epidural steroid injection Diagnostic bilateral cervical facet blocks Diagnostic bilateral intra-articular shoulder joint injection Diagnostic bilateral suprascapular nerve block Possible bilateral suprascapular nerve RFA Diagnosticright-sidedlumbar facet block #3 Possibleright-sidedlumbar facet RFA Diagnostic right-sided sacroiliac joint block #3 Possible right-sided sacroiliac joint RFA   Palliative PRN treatment(s):   None at this time    Provider-requested follow-up: Return for Appointment As Scheduled.  Future Appointments  Date Time Provider [place]  03/06/2018 12:00 PM [facility] 1 [facility]  03/06/2018  3:00 PM [facility] 3 [facility]  03/17/2018 11:15 AM Vevelyn Francois, NP [facility] None  04/29/2018  1:30 PM Penumalli, Earlean Polka, MD [facility] None   Primary Care Physician: Wenda Low, MD Location: [facility] Note by: Vevelyn Francois NP Date: 03/05/2018; Time: 3:18 PM  Pain Score Disclaimer: We use the NRS-11 scale. This is a self-reported, subjective measurement of pain severity with only modest accuracy. It is used primarily to identify changes within a particular patient. It must be understood that outpatient pain scales are significantly less accurate that those used for research, where they can be applied under ideal controlled circumstances with minimal exposure to variables. In reality, the score is likely to be a combination of pain intensity and  pain affect, where pain affect describes the degree of emotional arousal or changes in action readiness caused by the sensory experience of pain. Factors such as social and work situation, setting, emotional state, anxiety levels, expectation, and prior pain experience may influence pain perception and show large inter-individual differences that may also be affected by time variables.  Patient instructions provided during this appointment: Patient Instructions  ____________________________________________________________________________________________  Medication Rules  Applies to: All patients receiving prescriptions (written or electronic).  Pharmacy of record: Pharmacy where electronic prescriptions will be sent. If written prescriptions are taken to a different pharmacy, please inform the nursing staff. The pharmacy listed in the electronic medical record should be the one where you would like electronic prescriptions to be sent.  Prescription refills: Only during scheduled appointments. Applies to both, written and electronic prescriptions.  NOTE: The following applies primarily to controlled substances (Opioid* Pain Medications).   Patient's responsibilities: 1. Pain Pills: Bring all pain pills to every appointment (except for procedure appointments). 2. Pill Bottles: Bring pills in original pharmacy bottle. Always bring newest bottle. Bring bottle, even if empty. 3. Medication refills: You are responsible for knowing and keeping track of what medications you need refilled. The day before your appointment, write a list of all prescriptions that need to be refilled. Bring that list to your appointment and give it to the admitting nurse. Prescriptions will be written only during appointments. If you forget a medication, it will not be "Called in", "Faxed", or "electronically sent". You will need to get another appointment to get these prescribed. 4. Prescription Accuracy: You are  responsible for carefully inspecting your prescriptions before leaving our office. Have the discharge nurse carefully go over each prescription with you, before taking them home. Make sure that your name is accurately spelled, that your address is correct. Check the name and dose of your medication to make sure it is accurate. Check the number of pills, and the written instructions to make sure they are clear and accurate. Make sure that you are given enough medication to last until your next medication refill appointment. 5. Taking Medication: Take medication as prescribed. Never take more pills than instructed. Never take  medication more frequently than prescribed. Taking less pills or less frequently is permitted and encouraged, when it comes to controlled substances (written prescriptions).  6. Inform other Doctors: Always inform, all of your healthcare providers, of all the medications you take. 7. Pain Medication from other Providers: You are not allowed to accept any additional pain medication from any other Doctor or Healthcare provider. There are two exceptions to this rule. (see below) In the event that you require additional pain medication, you are responsible for notifying us, as stated below. 8. Medication Agreement: You are responsible for carefully reading and following our Medication Agreement. This must be signed before receiving any prescriptions from our practice. Safely store a copy of your signed Agreement. Violations to the Agreement will result in no further prescriptions. (Additional copies of our Medication Agreement are available upon request.) 9. Laws, Rules, & Regulations: All patients are expected to follow all Federal and [employer], Rules, [employer]. Ignorance of the Laws does not constitute a valid excuse. The use of any illegal substances is prohibited. 10. Adopted CDC guidelines & recommendations: Target dosing levels will be at or below 60 MME/day. Use of  benzodiazepines** is not recommended.  Exceptions: There are only two exceptions to the rule of not receiving pain medications from other Healthcare Providers. 1. Exception #1 (Emergencies): In the event of an emergency (i.e.: accident requiring emergency care), you are allowed to receive additional pain medication. However, you are responsible for: As soon as you are able, call our office [phone], at any time of the day or night, and leave a message stating your name, the date and nature of the emergency, and the name and dose of the medication prescribed. In the event that your call is answered by a member of our staff, make sure to document and save the date, time, and the name of the person that took your information.  2. Exception #2 (Planned Surgery): In the event that you are scheduled by another doctor or dentist to have any type of surgery or procedure, you are allowed (for a period no longer than 30 days), to receive additional pain medication, for the acute post-op pain. However, in this case, you are responsible for picking up a copy of our "Post-op Pain Management for Surgeons" handout, and giving it to your surgeon or dentist. This document is available at our office, and does not require an appointment to obtain it. Simply go to our office during business hours (Monday-Thursday from 8:00 AM to 4:00 PM) (Friday 8:00 AM to 12:00 Noon) or if you have a scheduled appointment with [place], prior to your surgery, and ask for it by name. In addition, you will need to provide [place] with your name, name of your surgeon, type of surgery, and date of procedure or surgery.  *Opioid medications include: morphine, codeine, oxycodone, oxymorphone, hydrocodone, hydromorphone, meperidine, tramadol, tapentadol, buprenorphine, fentanyl, methadone. **Benzodiazepine medications include: diazepam (Valium), alprazolam (Xanax), clonazepam (Klonopine), lorazepam (Ativan), clorazepate (Tranxene), chlordiazepoxide  (Librium), estazolam (Prosom), oxazepam (Serax), temazepam (Restoril), triazolam (Halcion) (Last updated: 07/24/2017) ____________________________________________________________________________________________   ____________________________________________________________________________________________  Appointment Policy Summary  It is our goal and responsibility to provide the medical community with assistance in the evaluation and management of patients with chronic pain. Unfortunately our resources are limited. Because we do not have an unlimited amount of time, or available appointments, we are required to closely monitor and manage their use. The following rules exist to maximize their use:  Patient's responsibilities: 1. Punctuality:  At  what time should I arrive? You should be physically present in our office 30 minutes before your scheduled appointment. Your scheduled appointment is with your assigned healthcare provider. However, it takes 5-10 minutes to be "checked-in", and another 15 minutes for the nurses to do the admission. If you arrive to our office at the time you were given for your appointment, you will end up being at least 20-25 minutes late to your appointment with the provider. 2. Tardiness:  What happens if I arrive only a few minutes after my scheduled appointment time? You will need to reschedule your appointment. The cutoff is your appointment time. This is why it is so important that you arrive at least 30 minutes before that appointment. If you have an appointment scheduled for 10:00 AM and you arrive at 10:01, you will be required to reschedule your appointment.  3. Plan ahead:  Always assume that you will encounter traffic on your way in. Plan for it. If you are dependent on a driver, make sure they understand these rules and the need to arrive early. 4. Other appointments and responsibilities:  Avoid scheduling any other appointments before or after your pain  clinic appointments.  5. Be prepared:  Write down everything that you need to discuss with your healthcare provider and give this information to the admitting nurse. Write down the medications that you will need refilled. Bring your pills and bottles (even the empty ones), to all of your appointments, except for those where a procedure is scheduled. 6. No children or pets:  Find someone to take care of them. It is not appropriate to bring them in. 7. Scheduling changes:  We request "advanced notification" of any changes or cancellations. 8. Advanced notification:  Defined as a time period of more than 24 hours prior to the originally scheduled appointment. This allows for the appointment to be offered to other patients. 9. Rescheduling:  When a visit is rescheduled, it will require the cancellation of the original appointment. For this reason they both fall within the category of "Cancellations".  10. Cancellations:  They require advanced notification. Any cancellation less than 24 hours before the  appointment will be recorded as a "No Show". 11. No Show:  Defined as an unkept appointment where the patient failed to notify or declare to the practice their intention or inability to keep the appointment.  Corrective process for repeat offenders:  1. Tardiness: Three (3) episodes of rescheduling due to late arrivals will be recorded as one (1) "No Show". 2. Cancellation or reschedule: Three (3) cancellations or rescheduling will be recorded as one (1) "No Show". 3. "No Shows": Three (3) "No Shows" within a 12 month period will result in discharge from the practice. ____________________________________________________________________________________________

## 2018-03-05 NOTE — Patient Instructions (Signed)
____________________________________________________________________________________________  Medication Rules  Applies to: All patients receiving prescriptions (written or electronic).  Pharmacy of record: Pharmacy where electronic prescriptions will be sent. If written prescriptions are taken to a different pharmacy, please inform the nursing staff. The pharmacy listed in the electronic medical record should be the one where you would like electronic prescriptions to be sent.  Prescription refills: Only during scheduled appointments. Applies to both, written and electronic prescriptions.  NOTE: The following applies primarily to controlled substances (Opioid* Pain Medications).   Patient's responsibilities: 1. Pain Pills: Bring all pain pills to every appointment (except for procedure appointments). 2. Pill Bottles: Bring pills in original pharmacy bottle. Always bring newest bottle. Bring bottle, even if empty. 3. Medication refills: You are responsible for knowing and keeping track of what medications you need refilled. The day before your appointment, write a list of all prescriptions that need to be refilled. Bring that list to your appointment and give it to the admitting nurse. Prescriptions will be written only during appointments. If you forget a medication, it will not be "Called in", "Faxed", or "electronically sent". You will need to get another appointment to get these prescribed. 4. Prescription Accuracy: You are responsible for carefully inspecting your prescriptions before leaving our office. Have the discharge nurse carefully go over each prescription with you, before taking them home. Make sure that your name is accurately spelled, that your address is correct. Check the name and dose of your medication to make sure it is accurate. Check the number of pills, and the written instructions to make sure they are clear and accurate. Make sure that you are given enough medication to last  until your next medication refill appointment. 5. Taking Medication: Take medication as prescribed. Never take more pills than instructed. Never take medication more frequently than prescribed. Taking less pills or less frequently is permitted and encouraged, when it comes to controlled substances (written prescriptions).  6. Inform other Doctors: Always inform, all of your healthcare providers, of all the medications you take. 7. Pain Medication from other Providers: You are not allowed to accept any additional pain medication from any other Doctor or Healthcare provider. There are two exceptions to this rule. (see below) In the event that you require additional pain medication, you are responsible for notifying us, as stated below. 8. Medication Agreement: You are responsible for carefully reading and following our Medication Agreement. This must be signed before receiving any prescriptions from our practice. Safely store a copy of your signed Agreement. Violations to the Agreement will result in no further prescriptions. (Additional copies of our Medication Agreement are available upon request.) 9. Laws, Rules, & Regulations: All patients are expected to follow all Federal and State Laws, Statutes, Rules, & Regulations. Ignorance of the Laws does not constitute a valid excuse. The use of any illegal substances is prohibited. 10. Adopted CDC guidelines & recommendations: Target dosing levels will be at or below 60 MME/day. Use of benzodiazepines** is not recommended.  Exceptions: There are only two exceptions to the rule of not receiving pain medications from other Healthcare Providers. 1. Exception #1 (Emergencies): In the event of an emergency (i.e.: accident requiring emergency care), you are allowed to receive additional pain medication. However, you are responsible for: As soon as you are able, call our office [phone], at any time of the day or night, and leave a message stating your name, the  date and nature of the emergency, and the name and dose of the medication   prescribed. In the event that your call is answered by a member of our staff, make sure to document and save the date, time, and the name of the person that took your information.  2. Exception #2 (Planned Surgery): In the event that you are scheduled by another doctor or dentist to have any type of surgery or procedure, you are allowed (for a period no longer than 30 days), to receive additional pain medication, for the acute post-op pain. However, in this case, you are responsible for picking up a copy of our "Post-op Pain Management for Surgeons" handout, and giving it to your surgeon or dentist. This document is available at our office, and does not require an appointment to obtain it. Simply go to our office during business hours (Monday-Thursday from 8:00 AM to 4:00 PM) (Friday 8:00 AM to 12:00 Noon) or if you have a scheduled appointment with [place], prior to your surgery, and ask for it by name. In addition, you will need to provide [place] with your name, name of your surgeon, type of surgery, and date of procedure or surgery.  *Opioid medications include: morphine, codeine, oxycodone, oxymorphone, hydrocodone, hydromorphone, meperidine, tramadol, tapentadol, buprenorphine, fentanyl, methadone. **Benzodiazepine medications include: diazepam (Valium), alprazolam (Xanax), clonazepam (Klonopine), lorazepam (Ativan), clorazepate (Tranxene), chlordiazepoxide (Librium), estazolam (Prosom), oxazepam (Serax), temazepam (Restoril), triazolam (Halcion) (Last updated: 07/24/2017) ____________________________________________________________________________________________   ____________________________________________________________________________________________  Appointment Policy Summary  It is our goal and responsibility to provide the medical community with assistance in the evaluation and management of patients with chronic pain.  Unfortunately our resources are limited. Because we do not have an unlimited amount of time, or available appointments, we are required to closely monitor and manage their use. The following rules exist to maximize their use:  Patient's responsibilities: 1. Punctuality:  At what time should I arrive? You should be physically present in our office 30 minutes before your scheduled appointment. Your scheduled appointment is with your assigned healthcare provider. However, it takes 5-10 minutes to be "checked-in", and another 15 minutes for the nurses to do the admission. If you arrive to our office at the time you were given for your appointment, you will end up being at least 20-25 minutes late to your appointment with the provider. 2. Tardiness:  What happens if I arrive only a few minutes after my scheduled appointment time? You will need to reschedule your appointment. The cutoff is your appointment time. This is why it is so important that you arrive at least 30 minutes before that appointment. If you have an appointment scheduled for 10:00 AM and you arrive at 10:01, you will be required to reschedule your appointment.  3. Plan ahead:  Always assume that you will encounter traffic on your way in. Plan for it. If you are dependent on a driver, make sure they understand these rules and the need to arrive early. 4. Other appointments and responsibilities:  Avoid scheduling any other appointments before or after your pain clinic appointments.  5. Be prepared:  Write down everything that you need to discuss with your healthcare provider and give this information to the admitting nurse. Write down the medications that you will need refilled. Bring your pills and bottles (even the empty ones), to all of your appointments, except for those where a procedure is scheduled. 6. No children or pets:  Find someone to take care of them. It is not appropriate to bring them in. 7. Scheduling changes:  We request  "advanced notification" of any changes or  cancellations. 8. Advanced notification:  Defined as a time period of more than 24 hours prior to the originally scheduled appointment. This allows for the appointment to be offered to other patients. 9. Rescheduling:  When a visit is rescheduled, it will require the cancellation of the original appointment. For this reason they both fall within the category of "Cancellations".  10. Cancellations:  They require advanced notification. Any cancellation less than 24 hours before the  appointment will be recorded as a "No Show". 11. No Show:  Defined as an unkept appointment where the patient failed to notify or declare to the practice their intention or inability to keep the appointment.  Corrective process for repeat offenders:  1. Tardiness: Three (3) episodes of rescheduling due to late arrivals will be recorded as one (1) "No Show". 2. Cancellation or reschedule: Three (3) cancellations or rescheduling will be recorded as one (1) "No Show". 3. "No Shows": Three (3) "No Shows" within a 12 month period will result in discharge from the practice. ____________________________________________________________________________________________

## 2018-03-05 NOTE — Progress Notes (Signed)
Nursing Pain Medication Assessment:  Safety precautions to be maintained throughout the outpatient stay will include: orient to surroundings, keep bed in low position, maintain call bell within reach at all times, provide assistance with transfer out of bed and ambulation.  Medication Inspection Compliance: Ms. Croy did not comply with our request to bring her pills to be counted. She was reminded that bringing the medication bottles, even when empty, is a requirement.  Medication: None brought in. Pill/Patch Count: None available to be counted. Bottle Appearance: No container available. Did not bring bottle(s) to appointment. Filled Date: N/A Last Medication intake:  Day before yesterday

## 2018-03-06 ENCOUNTER — Encounter (HOSPITAL_COMMUNITY)
Admission: RE | Admit: 2018-03-06 | Discharge: 2018-03-06 | Disposition: A | Discharge status: Home / Self Care | Payer: Medicare Other | Source: Ambulatory Visit | Attending: Orthopedic Surgery | Admitting: Orthopedic Surgery

## 2018-03-06 ENCOUNTER — Ambulatory Visit (HOSPITAL_COMMUNITY): Payer: Medicare Other

## 2018-03-06 ENCOUNTER — Other Ambulatory Visit (HOSPITAL_COMMUNITY): Payer: Medicare Other

## 2018-03-06 ENCOUNTER — Encounter (HOSPITAL_COMMUNITY): Payer: Medicare Other

## 2018-03-06 ENCOUNTER — Encounter (HOSPITAL_COMMUNITY): Payer: Self-pay

## 2018-03-06 DIAGNOSIS — T84030A Mechanical loosening of internal right hip prosthetic joint, initial encounter: Secondary | ICD-10-CM | POA: Diagnosis present

## 2018-03-06 MED ORDER — TECHNETIUM TC 99M MEDRONATE IV KIT
21.3000 | PACK | Freq: Once | INTRAVENOUS | Status: AC | PRN
  Administered 2018-03-06: 21.3 via INTRAVENOUS

## 2018-03-07 ENCOUNTER — Emergency Department (HOSPITAL_COMMUNITY)
Admission: EM | Admit: 2018-03-07 | Discharge: 2018-03-07 | Disposition: A | Discharge status: Home / Self Care | Payer: Medicare Other | Attending: Emergency Medicine | Admitting: Emergency Medicine

## 2018-03-07 ENCOUNTER — Other Ambulatory Visit: Payer: Self-pay

## 2018-03-07 DIAGNOSIS — G8929 Other chronic pain: Secondary | ICD-10-CM | POA: Diagnosis not present

## 2018-03-07 DIAGNOSIS — I129 Hypertensive chronic kidney disease with stage 1 through stage 4 chronic kidney disease, or unspecified chronic kidney disease: Secondary | ICD-10-CM | POA: Insufficient documentation

## 2018-03-07 DIAGNOSIS — Z85828 Personal history of other malignant neoplasm of skin: Secondary | ICD-10-CM | POA: Insufficient documentation

## 2018-03-07 DIAGNOSIS — N183 Chronic kidney disease, stage 3 (moderate): Secondary | ICD-10-CM | POA: Diagnosis not present

## 2018-03-07 DIAGNOSIS — M25551 Pain in right hip: Secondary | ICD-10-CM | POA: Diagnosis not present

## 2018-03-07 DIAGNOSIS — Z79899 Other long term (current) drug therapy: Secondary | ICD-10-CM | POA: Diagnosis not present

## 2018-03-07 DIAGNOSIS — Z96641 Presence of right artificial hip joint: Secondary | ICD-10-CM | POA: Insufficient documentation

## 2018-03-07 DIAGNOSIS — J449 Chronic obstructive pulmonary disease, unspecified: Secondary | ICD-10-CM | POA: Insufficient documentation

## 2018-03-07 DIAGNOSIS — Z87891 Personal history of nicotine dependence: Secondary | ICD-10-CM | POA: Insufficient documentation

## 2018-03-07 MED ORDER — HYDROMORPHONE HCL 1 MG/ML IJ SOLN
1.0000 mg | Freq: Once | INTRAMUSCULAR | Status: AC
  Administered 2018-03-07: 1 mg via INTRAMUSCULAR
  Filled 2018-03-07: qty 1

## 2018-03-07 MED ORDER — OXYCODONE-ACETAMINOPHEN 5-325 MG PO TABS: 1.0000 | ORAL_TABLET | Freq: Four times a day (QID) | ORAL | 0 refills | Status: DC | PRN

## 2018-03-07 MED ORDER — OXYCODONE-ACETAMINOPHEN 5-325 MG PO TABS
2.0000 | ORAL_TABLET | Freq: Once | ORAL | Status: AC
  Administered 2018-03-07: 2 via ORAL
  Filled 2018-03-07: qty 2

## 2018-03-07 NOTE — ED Notes (Signed)
Patient verbalizes understanding of discharge instructions. Opportunity for questioning and answers were provided. Armband removed by staff, pt discharged from ED.  

## 2018-03-07 NOTE — ED Provider Notes (Signed)
[facility] EMERGENCY DEPARTMENT Provider Note   CSN: [id number] Arrival date & time: 03/07/18  2014     History   Chief Complaint Chief Complaint  Patient presents with  . Leg Pain    HPI [name] is a 77 y.o. female.  She is a history of right hip to knee pain for about 6 months ever since she had surgery on that area.  It intermittently it causes her severe pain and again happened over the last few days.  She is followed by Dr. Percell Miller from orthopedics and just underwent a bone scan.  They have a follow-up appointment in a week or so with Dr. Percell Miller.  She has been seen in pain clinic before for this but they felt she was in acute pain and referred her back to the orthopedist.  She currently is not on any pain medicine but says she has had Percocet and morphine in the past for this.  There is been no new fall no fevers no chills.  She is tearful here on exam and begging me to make the pain go away.  The history is provided by the patient.  Leg Pain   This is a chronic problem. The current episode started more than 2 days ago. The problem occurs constantly. The problem has not changed since onset.The pain is present in the right upper leg, right hip and right knee. The quality of the pain is described as aching. The pain is severe. Pertinent negatives include no numbness. The symptoms are aggravated by activity and standing. She has tried OTC pain medications for the symptoms. The treatment provided no relief. There has been no history of extremity trauma. Family history is significant for no gout.    Past Medical History:  Diagnosis Date  . Acute postoperative pain 02/03/2018  . Anemia   . B12 deficiency   . Bacteremia 10/07/2014  . Bladder incontinence   . Blind left eye   . Cancer ([place])    skin cancer   . Cataract   . Cervical spine fracture ([place])   . Chest pain 07/29/2013  . Compression fracture    C7,  upper T spine -compression fx  . COPD (chronic  obstructive pulmonary disease) ([place])   . DDD (degenerative disc disease), lumbar   . Depression    major  . Diffuse myofascial pain syndrome 03/24/2015  . Dyspnea    at times- when activty  . Fever 10/05/2014  . GERD (gastroesophageal reflux disease)   . Hiatal hernia   . Hyperlipidemia   . Hypertension   . 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 Macular degeneration   . Osteoarthritis   . Osteoporosis   . Pneumonia    years ago  . Reactive airway disease   . Rupture of bowel ([place])   . Sepsis ([place]) 10/08/2014  . Stroke [facility])    "mini stroke"- balance and memory issue  . Stroke ([place])   . Wrist pain, acute 09/10/2012    Patient Active Problem List   Diagnosis Date Noted  . Acute right hip pain 02/26/2018  . Closed hip fracture, sequela (Right) 02/03/2018  . It band syndrome, right 12/18/2017  . History of hip replacement (Right) 12/18/2017  . Ileus ([place]) 10/27/2017  . Primary osteoarthritis of hip 09/30/2017  . History of small bowel obstruction 09/10/2017  . 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 SBO (small bowel obstruction) ([place]) 08/24/2017  . Abnormal x-ray of  pelvis 08/13/2017  . Other specified dorsopathies, sacral and sacrococcygeal region 08/04/2017  . Spondylosis without myelopathy or radiculopathy, lumbosacral region 07/17/2017  . Non-traumatic compression fracture of T3 thoracic vertebra, sequela 07/02/2017  . High risk medication use 07/02/2017  . At high risk for falls 07/02/2017  . Chronic sacroiliac joint pain (Right) 07/02/2017  . CKD (chronic kidney disease) stage 3, GFR 30-59 ml/min (HCC) 04/23/2017  . Chronic knee pain s/p total knee replacement (TKR) (Right) 04/02/2017  . DDD (degenerative disc disease), lumbar 03/05/2017  . Chronic knee pain (Bilateral) (R>L) 03/05/2017  . Osteoarthritis 03/05/2017  . Chronic musculoskeletal pain 03/05/2017  . Neurogenic pain 03/05/2017  . Chronic myofascial pain 02/12/2017  .  Lumbar facet syndrome (Bilateral) (L>R) 02/12/2017  . Long term prescription benzodiazepine use 02/12/2017  . Opiate use 02/12/2017  . Disorder of skeletal system 02/12/2017  . Pharmacologic therapy 02/12/2017  . Problems influencing health status 02/12/2017  . 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 NSAID long-term use 02/12/2017  . DDD (degenerative disc disease), cervical 11/11/2016  . Lumbar facet arthropathy (Bilateral) 11/11/2016  . 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 CAFL (chronic airflow limitation) ([place]) 11/07/2016  . Depression, major, in partial remission ([place]) 11/07/2016  . 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 Bed wetting 11/07/2016  . Long term current use of opiate analgesic 11/07/2016  . Long term prescription opiate use 11/07/2016  . Chronic pain syndrome 11/07/2016  . Chronic neck pain (Secondary Area of Pain) (Bilateral) (L>R) 11/07/2016  . Chronic hip pain (Right) 11/07/2016  . Rib pain 11/07/2016  . Age-related osteoporosis with current pathological fracture with routine healing 09/20/2016  . History of stroke 09/20/2016  . Major depression in remission ([place]) 09/20/2016  . Intertrochanteric fracture of femur, sequela (Right) 09/13/2016  . Fall   . Chronic shoulder pain (Bilateral) 08/12/2016  . Hx of total knee replacement (Bilateral) 04/04/2016  . Chronic shoulder pain (Left) 02/28/2016  . Hoarseness 01/12/2016  . Pharyngoesophageal dysphagia 01/12/2016  . Chronic low back pain [facility]) (Bilateral) (L>R) 10/26/2015  . 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 UTI (urinary tract infection) 06/25/2015  . Syncope, non cardiac 05/22/2015  . Leukopenia 10/07/2014  . Thrombocytopenia ([place]) 10/07/2014  . Hypoxia 10/05/2014  . Anxiety 06/15/2014  . Apoplectic vertigo 06/15/2014  . Duodenogastric reflux 06/15/2014  .  Benign essential HTN 06/15/2014  . Infection of the upper respiratory tract 06/15/2014  . Hypercholesteremia 07/30/2013  . HTN (hypertension) 07/30/2013  . GERD (gastroesophageal reflux disease) 07/30/2013  . Dizziness and giddiness 06/18/2013  . Memory loss 05/03/2013  . Chronic knee pain (Primary Area of Pain) (Right) 09/10/2012    Past Surgical History:  Procedure Laterality Date  . ABDOMINAL HYSTERECTOMY    . ABDOMINAL SURGERY    . APPENDECTOMY    . BLADDER SURGERY    . CHOLECYSTECTOMY    . COLON SURGERY    . COLOSTOMY REVERSAL    . EYE SURGERY     cataracts  . HIP SURGERY Right 08/2016  . INTRAMEDULLARY (IM) NAIL INTERTROCHANTERIC Right 09/13/2016   Procedure: INTRAMEDULLARY (IM) NAIL INTERTROCHANTRIC RIGHT;  Surgeon: Leandrew Koyanagi, MD;  Location: [facility];  Service: Orthopedics;  Laterality: Right;  . JOINT REPLACEMENT Bilateral   . KNEE SURGERY    . OSTOMY    . RECTOCELE REPAIR    . TOE SURGERY Right    3rd  .  TOTAL HIP ARTHROPLASTY Right 09/30/2017   Procedure: RIGHT HIP IM NAIL REMOVAL WITH CONVERSION TO TOTAL HIP ARTHOPLASTY, anterior approach;  Surgeon: Renette Butters, MD;  Location: [facility];  Service: Orthopedics;  Laterality: Right;     OB History    Gravida  0   Para  0   Term  0   Preterm  0   AB  0   Living        SAB  0   TAB  0   Ectopic  0   Multiple      Live Births               Home Medications    Prior to Admission medications   Medication Sig Start Date End Date Taking? Authorizing Provider  albuterol (ACCUNEB) 1.25 MG/3ML nebulizer solution Take 1 ampule by nebulization every 6 (six) hours as needed for wheezing.    [provider]  albuterol (PROVENTIL HFA;VENTOLIN HFA) 108 (90 BASE) MCG/ACT inhaler Inhale 2 puffs into the lungs every 6 (six) hours as needed for wheezing or shortness of breath.     [provider]  alendronate (FOSAMAX) 70 MG tablet Take 70 mg by mouth every Monday. Take with a full glass of  water on an empty stomach.    [provider]  ALPRAZolam Duanne Moron) 1 MG tablet Take 1 mg by mouth at bedtime as needed for sleep.    [provider]  amLODipine (NORVASC) 5 MG tablet Take 5 mg by mouth daily.  04/06/13   [provider]  Calcium-Phosphorus-Vitamin D (CALCIUM/D3 ADULT GUMMIES PO) Take 1 tablet by mouth daily.    [provider]  citalopram (CELEXA) 20 MG tablet Take 20 mg by mouth daily.  02/27/17   [provider]  docusate sodium (COLACE) 100 MG capsule Take 200 mg by mouth 2 (two) times daily as needed for moderate constipation.     [provider]  esomeprazole (NEXIUM) 40 MG capsule Take 40 mg by mouth daily.     [provider]  Ferrous Sulfate (IRON) 325 (65 Fe) MG TABS Take 1 tablet (325 mg total) by mouth 2 (two) times daily. Patient taking differently: Take 1 tablet by mouth 2 (two) times a week.  10/01/17   Martensen, Charna Elizabeth III, PA-C  guaiFENesin (MUCINEX) 600 MG 12 hr tablet Take 600 mg by mouth 2 (two) times daily as needed for cough.    [provider]  linaclotide (LINZESS) 290 MCG CAPS capsule Take 290 mcg by mouth daily.    [provider]  lovastatin (MEVACOR) 10 MG tablet Take 10 mg by mouth daily. 09/22/14   [provider]  meloxicam (MOBIC) 15 MG tablet Take 15 mg by mouth daily.  05/23/16   [provider]  Menthol, Topical Analgesic, (BIOFREEZE EX) Apply 1 application topically daily as needed (muscle pain).    [provider]  Multiple Vitamins-Minerals (MULTIVITAMIN GUMMIES ADULT PO) Take 1 tablet by mouth daily.    [provider]  ondansetron (ZOFRAN ODT) 4 MG disintegrating tablet Take 1 tablet (4 mg total) by mouth every 8 (eight) hours as needed for nausea or vomiting. 02/26/18   Eula Listen, MD  orphenadrine (NORFLEX) 100 MG tablet Take 1 tablet (100 mg total) by mouth 2 (two) times daily. 12/09/17 03/09/18  Vevelyn Francois, NP    oxyCODONE (OXY IR/ROXICODONE) 5 MG immediate release tablet Take 1 tablet (5 mg total) by mouth every 8 (eight)  hours as needed for severe pain. 12/29/17 02/17/26  Milinda Pointer, MD  oxyCODONE-acetaminophen (PERCOCET) 5-325 MG tablet Take 1 tablet by mouth every 6 (six) hours as needed for severe pain. 02/26/18 02/26/19  Eula Listen, MD  polyethylene glycol (MIRALAX / Floria Raveling) packet Take 17 g by mouth daily as needed (for constipation.).     [provider]  pregabalin (LYRICA) 75 MG capsule Take 1 capsule (75 mg total) by mouth 2 (two) times daily. 12/29/17 02/17/26  Milinda Pointer, MD  traZODone (DESYREL) 100 MG tablet Take 100 mg by mouth at bedtime.     [provider]    Family History Family History  Problem Relation Age of Onset  . Heart failure Mother   . Heart attack Father     Social History Social History   Tobacco Use  . Smoking status: Former Smoker    Packs/day: 1.50    Years: 30.00    Pack years: 45.00    Types: E-cigarettes, Cigarettes    Last attempt to quit: 2013    Years since quitting: 6.7  . Smokeless tobacco: Never Used  Substance Use Topics  . Alcohol use: No    Alcohol/week: 0.0 standard drinks  . Drug use: No     Allergies   Ampicillin; Ampicillin-sulbactam sodium; Ambien [zolpidem tartrate]; Flexeril [cyclobenzaprine]; Tape; Toradol [ketorolac tromethamine]; Sulfamethoxazole-trimethoprim; Zolpidem; Cephalexin; and Tramadol   Review of Systems Review of Systems  Constitutional: Negative for fever.  HENT: Negative for sore throat.   Eyes: Negative for visual disturbance.  Respiratory: Negative for shortness of breath.   Cardiovascular: Negative for chest pain.  Gastrointestinal: Negative for abdominal pain.  Genitourinary: Negative for dysuria.  Musculoskeletal: Positive for gait problem.  Skin: Negative for rash.  Neurological: Negative for numbness.     Physical Exam Updated Vital Signs BP (!) 144/107 (BP  Location: Right Arm)   Pulse 66   Temp 99 F (37.2 C) (Oral)   Resp 15   SpO2 97%   Physical Exam  Constitutional: She appears well-developed and well-nourished.  HENT:  Head: Normocephalic and atraumatic.  Eyes: Conjunctivae are normal.  Neck: Neck supple.  Cardiovascular: Normal rate, regular rhythm and normal heart sounds.  Pulmonary/Chest: Effort normal. No stridor. She has no wheezes.  Abdominal: Soft. There is no tenderness. There is no guarding.  Musculoskeletal:  She has diffuse pain on the lateral side of her right upper leg from her hip down to her knee.  There is a well-healed surgical scar there with some nodular scar tissue.  No gross erythema or fluctuance in the area.  Neurological: She is alert. GCS eye subscore is 4. GCS verbal subscore is 5. GCS motor subscore is 6.  Skin: Skin is warm and dry.  Psychiatric: She has a normal mood and affect.  Nursing note and vitals reviewed.    ED Treatments / Results  Labs (all labs ordered are listed, but only abnormal results are displayed) Labs Reviewed - No data to display  EKG None  Radiology Nm Bone Scan 3 Phase  Result Date: 03/06/2018 CLINICAL DATA:  Constant pain in RIGHT hip since hip replacement in May EXAM: NUCLEAR MEDICINE 3-PHASE BONE SCAN TECHNIQUE: Radionuclide angiographic images, immediate static blood pool images, and 3-hour delayed static images were obtained of the hips after intravenous injection of radiopharmaceutical. RADIOPHARMACEUTICALS:  21.3 mCi Tc-60m MDP IV COMPARISON:  None Correlation: CT RIGHT hip 02/17/2018 FINDINGS: Vascular phase: Mildly increased blood flow to the RIGHT hip region the level of proximal  femur. Remaining visualized flow at the pelvis and LEFT hip is normal. Blood pool phase: Photopenic defect at RIGHT hip from hip prosthesis. Mildly increased blood pool is seen at the RIGHT hip region the level of the hip joint and proximal RIGHT femur. Delayed phase: Abnormal increased  delayed localization of tracer is identified adjacent to the proximal distal aspects of the femoral component of the RIGHT hip prosthesis. This pattern is consistent with loosening of the femoral component of the prosthesis. However, it is uncertain as to how much of this uptake could be related to relatively recent interval since surgery. No abnormal tracer localization at the pelvis or LEFT hip/femur. IMPRESSION: Mildly increased blood flow and blood pool tracer at the RIGHT hip region the level of the proximal femur. 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 Electronically Signed   By: Lavonia Dana M.D.   On: 03/06/2018 16:54    Procedures Procedures (including critical care time)  Medications Ordered in ED Medications  oxyCODONE-acetaminophen (PERCOCET/ROXICET) 5-325 MG per tablet 2 tablet (2 tablets Oral Given 03/07/18 2119)  HYDROmorphone (DILAUDID) injection 1 mg (1 mg Intramuscular Given 03/07/18 2248)     Initial Impression / Assessment and Plan / ED Course  I have reviewed the triage vital signs and the nursing notes.  Pertinent labs & imaging results that were available during my care of the patient were reviewed by me and considered in my medical decision making (see chart for details).  Clinical Course as of Mar 08 957  Sat Mar 07, 2018  2241 Patient received 2 Percocet tablets without any improvement in her pain.  I reviewed her bone scan see any obvious findings that require acute admission.  I reviewed this with the patient and her family.  I tried to lower their expectations to getting the pain under control enough for her to follow-up with her orthopedist as an outpatient.  I am  ordering an IM dose of some Dilaudid to see if that would help get her pain under control.   [MB]  2304 Reevaluated after the IM Dilaudid and she feels much better.  She understands we will discharge her on some oral pain medicine prescription and she will need to contact Dr. Percell Miller from orthopedics on Monday for close follow-up.   [MB]    Clinical Course User Index [MB] Hayden Rasmussen, MD    Final Clinical Impressions(s) / ED Diagnoses   Final diagnoses:  Chronic right hip pain    ED Discharge Orders         Ordered    oxyCODONE-acetaminophen (PERCOCET/ROXICET) 5-325 MG tablet  Every 6 hours PRN     03/07/18 2307           Hayden Rasmussen, MD 03/08/18 [phone]

## 2018-03-07 NOTE — ED Triage Notes (Signed)
Pt ED via EMS for leg pain. Fractured rt hip a year ago and rods put in six months ago and has pain ever since. Tylenol at 1530. Ambulatory on EMS arrival.

## 2018-03-07 NOTE — Discharge Instructions (Addendum)
You were evaluated in the emergency department for continued right hip pain since her surgery.  You received some pain medicine here that seemed to help with your pain.  It will be important for you to contact your orthopedic doctor for further follow-up.

## 2018-03-09 ENCOUNTER — Encounter: Payer: Medicare Other | Admitting: Nurse Practitioner

## 2018-03-09 ENCOUNTER — Ambulatory Visit: Payer: Medicare Other | Admitting: Pain Medicine

## 2018-03-10 ENCOUNTER — Encounter (HOSPITAL_COMMUNITY): Payer: Medicare Other

## 2018-03-10 ENCOUNTER — Other Ambulatory Visit (HOSPITAL_COMMUNITY): Payer: Medicare Other

## 2018-03-11 ENCOUNTER — Telehealth: Payer: Self-pay | Admitting: Nurse Practitioner

## 2018-03-11 NOTE — Telephone Encounter (Signed)
[name] called stating her mother saw Dr. Jobe Igo that did surgery on her knee, says the knee is not healing and will probably take about 6 months more to heal. He will not prescribe any more meds and says our clinic should be the ones taking care of pain meds. Patient is in excruciating pain and needs higher dose of pain meds. Please call them and let them know solution. I ask [name] to try to get records sent here from Dr. Gilford Rile.

## 2018-03-11 NOTE — Telephone Encounter (Signed)
I will forward this to Dr Lowella Dandy

## 2018-03-17 ENCOUNTER — Emergency Department
Admission: EM | Admit: 2018-03-17 | Discharge: 2018-03-17 | Disposition: A | Discharge status: Home / Self Care | Payer: Medicare Other | Attending: Emergency Medicine | Admitting: Emergency Medicine

## 2018-03-17 ENCOUNTER — Other Ambulatory Visit: Payer: Self-pay

## 2018-03-17 ENCOUNTER — Ambulatory Visit: Payer: Medicare Other | Admitting: Nurse Practitioner

## 2018-03-17 DIAGNOSIS — N183 Chronic kidney disease, stage 3 (moderate): Secondary | ICD-10-CM | POA: Diagnosis not present

## 2018-03-17 DIAGNOSIS — J449 Chronic obstructive pulmonary disease, unspecified: Secondary | ICD-10-CM | POA: Insufficient documentation

## 2018-03-17 DIAGNOSIS — Z87891 Personal history of nicotine dependence: Secondary | ICD-10-CM | POA: Insufficient documentation

## 2018-03-17 DIAGNOSIS — I129 Hypertensive chronic kidney disease with stage 1 through stage 4 chronic kidney disease, or unspecified chronic kidney disease: Secondary | ICD-10-CM | POA: Insufficient documentation

## 2018-03-17 DIAGNOSIS — M25552 Pain in left hip: Secondary | ICD-10-CM | POA: Insufficient documentation

## 2018-03-17 DIAGNOSIS — Z96643 Presence of artificial hip joint, bilateral: Secondary | ICD-10-CM | POA: Diagnosis not present

## 2018-03-17 DIAGNOSIS — M25551 Pain in right hip: Secondary | ICD-10-CM | POA: Diagnosis not present

## 2018-03-17 DIAGNOSIS — Z79899 Other long term (current) drug therapy: Secondary | ICD-10-CM | POA: Diagnosis not present

## 2018-03-17 MED ORDER — LIDOCAINE 5 % EX PTCH: 1.0000 | MEDICATED_PATCH | Freq: Two times a day (BID) | CUTANEOUS | 0 refills | Status: DC

## 2018-03-17 MED ORDER — HYDROMORPHONE HCL 1 MG/ML IJ SOLN
1.0000 mg | Freq: Once | INTRAMUSCULAR | Status: AC
  Administered 2018-03-17: 1 mg via INTRAMUSCULAR
  Filled 2018-03-17: qty 1

## 2018-03-17 NOTE — ED Provider Notes (Signed)
[facility] Emergency Department Provider Note   ____________________________________________   First MD Initiated Contact with Patient 03/17/18 1146     (approximate)  I have reviewed the triage vital signs and the nursing notes.   HISTORY  Chief Complaint Hip Pain    HPI [name] is a 77 y.o. female patient presents with bilateral hip pain.  Patient has had bilateral hip replacement last surgery was done in May of this year.  Patient states she is not getting pain relief from her pain management doctor.  Review of records show the patient has been in multiple cities for prescriptions of pain medication.  Discussed with patient the evenings emergency room policy on chronic pain medication.  Past Medical History:  Diagnosis Date  . Acute postoperative pain 02/03/2018  . Anemia   . B12 deficiency   . Bacteremia 10/07/2014  . Bladder incontinence   . Blind left eye   . Cancer ([place])    skin cancer   . Cataract   . Cervical spine fracture ([place])   . Chest pain 07/29/2013  . Compression fracture    C7,  upper T spine -compression fx  . COPD (chronic obstructive pulmonary disease) ([place])   . DDD (degenerative disc disease), lumbar   . Depression    major  . Diffuse myofascial pain syndrome 03/24/2015  . Dyspnea    at times- when activty  . Fever 10/05/2014  . GERD (gastroesophageal reflux disease)   . Hiatal hernia   . Hyperlipidemia   . Hypertension   . 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 Macular degeneration   . Osteoarthritis   . Osteoporosis   . Pneumonia    years ago  . Reactive airway disease   . Rupture of bowel ([place])   . Sepsis ([place]) 10/08/2014  . Stroke [facility])    "mini stroke"- balance and memory issue  . Stroke ([place])   . Wrist pain, acute 09/10/2012    Patient Active Problem List   Diagnosis Date Noted  . Acute right hip pain 02/26/2018  . Closed hip fracture, sequela (Right) 02/03/2018  . It band syndrome,  right 12/18/2017  . History of hip replacement (Right) 12/18/2017  . Ileus ([place]) 10/27/2017  . Primary osteoarthritis of hip 09/30/2017  . History of small bowel obstruction 09/10/2017  . 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 SBO (small bowel obstruction) ([place]) 08/24/2017  . Abnormal x-ray of pelvis 08/13/2017  . Other specified dorsopathies, sacral and sacrococcygeal region 08/04/2017  . Spondylosis without myelopathy or radiculopathy, lumbosacral region 07/17/2017  . Non-traumatic compression fracture of T3 thoracic vertebra, sequela 07/02/2017  . High risk medication use 07/02/2017  . At high risk for falls 07/02/2017  . Chronic sacroiliac joint pain (Right) 07/02/2017  . CKD (chronic kidney disease) stage 3, GFR 30-59 ml/min (HCC) 04/23/2017  . Chronic knee pain s/p total knee replacement (TKR) (Right) 04/02/2017  . DDD (degenerative disc disease), lumbar 03/05/2017  . Chronic knee pain (Bilateral) (R>L) 03/05/2017  . Osteoarthritis 03/05/2017  . Chronic musculoskeletal pain 03/05/2017  . Neurogenic pain 03/05/2017  . Chronic myofascial pain 02/12/2017  . Lumbar facet syndrome (Bilateral) (L>R) 02/12/2017  . Long term prescription benzodiazepine use 02/12/2017  . Opiate use 02/12/2017  . Disorder of skeletal system 02/12/2017  . Pharmacologic therapy 02/12/2017  . Problems influencing health status 02/12/2017  . 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 NSAID long-term use 02/12/2017  . DDD (degenerative disc disease),  cervical 11/11/2016  . Lumbar facet arthropathy (Bilateral) 11/11/2016  . 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 CAFL (chronic airflow limitation) ([place]) 11/07/2016  . Depression, major, in partial remission ([place]) 11/07/2016  . 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 Bed wetting 11/07/2016  . Long term current use of opiate analgesic 11/07/2016  . Long term prescription opiate use  11/07/2016  . Chronic pain syndrome 11/07/2016  . Chronic neck pain (Secondary Area of Pain) (Bilateral) (L>R) 11/07/2016  . Chronic hip pain (Right) 11/07/2016  . Rib pain 11/07/2016  . Age-related osteoporosis with current pathological fracture with routine healing 09/20/2016  . History of stroke 09/20/2016  . Major depression in remission ([place]) 09/20/2016  . Intertrochanteric fracture of femur, sequela (Right) 09/13/2016  . Fall   . Chronic shoulder pain (Bilateral) 08/12/2016  . Hx of total knee replacement (Bilateral) 04/04/2016  . Chronic shoulder pain (Left) 02/28/2016  . Hoarseness 01/12/2016  . Pharyngoesophageal dysphagia 01/12/2016  . Chronic low back pain [facility]) (Bilateral) (L>R) 10/26/2015  . 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 UTI (urinary tract infection) 06/25/2015  . Syncope, non cardiac 05/22/2015  . Leukopenia 10/07/2014  . Thrombocytopenia ([place]) 10/07/2014  . Hypoxia 10/05/2014  . Anxiety 06/15/2014  . Apoplectic vertigo 06/15/2014  . Duodenogastric reflux 06/15/2014  . Benign essential HTN 06/15/2014  . Infection of the upper respiratory tract 06/15/2014  . Hypercholesteremia 07/30/2013  . HTN (hypertension) 07/30/2013  . GERD (gastroesophageal reflux disease) 07/30/2013  . Dizziness and giddiness 06/18/2013  . Memory loss 05/03/2013  . Chronic knee pain (Primary Area of Pain) (Right) 09/10/2012    Past Surgical History:  Procedure Laterality Date  . ABDOMINAL HYSTERECTOMY    . ABDOMINAL SURGERY    . APPENDECTOMY    . BLADDER SURGERY    . CHOLECYSTECTOMY    . COLON SURGERY    . COLOSTOMY REVERSAL    . EYE SURGERY     cataracts  . HIP SURGERY Right 08/2016  . INTRAMEDULLARY (IM) NAIL INTERTROCHANTERIC Right 09/13/2016   Procedure: INTRAMEDULLARY (IM) NAIL INTERTROCHANTRIC RIGHT;  Surgeon: Leandrew Koyanagi, MD;  Location: [place];  Service: Orthopedics;  Laterality:  Right;  . JOINT REPLACEMENT Bilateral   . KNEE SURGERY    . OSTOMY    . RECTOCELE REPAIR    . TOE SURGERY Right    3rd  . TOTAL HIP ARTHROPLASTY Right 09/30/2017   Procedure: RIGHT HIP IM NAIL REMOVAL WITH CONVERSION TO TOTAL HIP ARTHOPLASTY, anterior approach;  Surgeon: Renette Butters, MD;  Location: [place];  Service: Orthopedics;  Laterality: Right;    Prior to Admission medications   Medication Sig Start Date End Date Taking? Authorizing Provider  albuterol (ACCUNEB) 1.25 MG/3ML nebulizer solution Take 1 ampule by nebulization every 6 (six) hours as needed for wheezing.    [provider]  albuterol (PROVENTIL HFA;VENTOLIN HFA) 108 (90 BASE) MCG/ACT inhaler Inhale 2 puffs into the lungs every 6 (six) hours as needed for wheezing or shortness of breath.     [provider]  alendronate (FOSAMAX) 70 MG tablet Take 70 mg by mouth every Monday. Take with a full glass of water on an empty stomach.    [provider]  ALPRAZolam Duanne Moron) 1 MG tablet Take 1 mg by mouth at bedtime as needed for sleep.    [provider]  amLODipine (NORVASC) 5 MG tablet  Take 5 mg by mouth daily.  04/06/13   [provider]  Calcium-Phosphorus-Vitamin D (CALCIUM/D3 ADULT GUMMIES PO) Take 1 tablet by mouth daily.    [provider]  citalopram (CELEXA) 20 MG tablet Take 20 mg by mouth daily.  02/27/17   [provider]  docusate sodium (COLACE) 100 MG capsule Take 200 mg by mouth 2 (two) times daily as needed for moderate constipation.     [provider]  esomeprazole (NEXIUM) 40 MG capsule Take 40 mg by mouth daily.     [provider]  Ferrous Sulfate (IRON) 325 (65 Fe) MG TABS Take 1 tablet (325 mg total) by mouth 2 (two) times daily. Patient taking differently: Take 1 tablet by mouth 2 (two) times a week.  10/01/17   Martensen, Charna Elizabeth III, PA-C  guaiFENesin (MUCINEX) 600 MG 12 hr tablet Take 600 mg by mouth 2 (two) times daily as  needed for cough.    [provider]  lidocaine (LIDODERM) 5 % Place 1 patch onto the skin every 12 (twelve) hours. Remove & Discard patch within 12 hours or as directed by MD 03/17/18 03/17/19  Sable Feil, PA-C  linaclotide [facility]) 290 MCG CAPS capsule Take 290 mcg by mouth daily.    [provider]  lovastatin (MEVACOR) 10 MG tablet Take 10 mg by mouth daily. 09/22/14   [provider]  meloxicam (MOBIC) 15 MG tablet Take 15 mg by mouth daily.  05/23/16   [provider]  Menthol, Topical Analgesic, (BIOFREEZE EX) Apply 1 application topically daily as needed (muscle pain).    [provider]  Multiple Vitamins-Minerals (MULTIVITAMIN GUMMIES ADULT PO) Take 1 tablet by mouth daily.    [provider]  ondansetron (ZOFRAN ODT) 4 MG disintegrating tablet Take 1 tablet (4 mg total) by mouth every 8 (eight) hours as needed for nausea or vomiting. 02/26/18   Eula Listen, MD  oxyCODONE-acetaminophen (PERCOCET/ROXICET) 5-325 MG tablet Take 1-2 tablets by mouth every 6 (six) hours as needed for severe pain. 03/07/18   Hayden Rasmussen, MD  polyethylene glycol [facility] / Floria Raveling) packet Take 17 g by mouth daily as needed (for constipation.).     [provider]  pregabalin (LYRICA) 75 MG capsule Take 1 capsule (75 mg total) by mouth 2 (two) times daily. 12/29/17 02/17/26  Milinda Pointer, MD  traZODone (DESYREL) 100 MG tablet Take 100 mg by mouth at bedtime.     [provider]    Allergies Ampicillin; Ampicillin-sulbactam sodium; Ambien [zolpidem tartrate]; Flexeril [cyclobenzaprine]; Tape; Toradol [ketorolac tromethamine]; Sulfamethoxazole-trimethoprim; Zolpidem; Cephalexin; and Tramadol  Family History  Problem Relation Age of Onset  . Heart failure Mother   . Heart attack Father     Social History Social History   Tobacco Use  . Smoking status: Former Smoker    Packs/day: 1.50    Years: 30.00    Pack  years: 45.00    Types: E-cigarettes, Cigarettes    Last attempt to quit: 2013    Years since quitting: 6.8  . Smokeless tobacco: Never Used  Substance Use Topics  . Alcohol use: No    Alcohol/week: 0.0 standard drinks  . Drug use: No    Review of Systems  Constitutional: No fever/chills Eyes: No visual changes. ENT: No sore throat. Cardiovascular: Denies chest pain. Respiratory: Denies shortness of breath. Gastrointestinal: No abdominal pain.  No nausea, no vomiting.  No diarrhea.  No constipation. Genitourinary: Negative for dysuria. Musculoskeletal: Bilateral hip pain.  Skin: Negative for rash. Neurological: Negative for headaches, focal weakness or numbness. Psychiatric:Depression Endocrine:Hyperlipidemia and hypertension. Allergic/Immunilogical: See medication list. ____________________________________________   PHYSICAL EXAM:  VITAL SIGNS: ED Triage Vitals  Enc Vitals Group     BP 03/17/18 1120 (!) 151/88     Pulse Rate 03/17/18 1120 68     Resp 03/17/18 1120 18     Temp 03/17/18 1120 98.2 F (36.8 C)     Temp Source 03/17/18 1120 Oral     SpO2 03/17/18 1120 94 %     Weight 03/17/18 1119 175 lb (79.4 kg)     Height 03/17/18 1119 5\' 5"  (1.651 m)     Head Circumference --      Peak Flow --      Pain Score 03/17/18 1120 10     Pain Loc --      Pain Edu? --      Excl. in [place]? --    Constitutional: Alert and oriented. Well appearing and in no acute distress. Neck: No stridor.  Hematological/Lymphatic/Immunilogical: No cervical lymphadenopathy. Cardiovascular: Normal rate, regular rhythm. Grossly normal heart sounds.  Good peripheral circulation. Respiratory: Normal respiratory effort.  No retractions. Lungs CTAB. Gastrointestinal: Soft and nontender. No distention. No abdominal bruits. No CVA tenderness. Musculoskeletal: No leg length discrepancy.  There is a well-healed scar left greater trochanter area consistent with her history.  Patient has moderate  guarding palpation bilateral lateral hips.  Patient has passive full and equal range of motion.. Neurologic:  Normal speech and language. No gross focal neurologic deficits are appreciated. No gait instability. Skin:  Skin is warm, dry and intact. No rash noted. Psychiatric: Mood and affect are normal. Speech and behavior are normal.  ____________________________________________   LABS (all labs ordered are listed, but only abnormal results are displayed)  Labs Reviewed - No data to display ____________________________________________  EKG   ____________________________________________  RADIOLOGY  ED MD interpretation:    Official radiology report(s): No results found.  ____________________________________________   PROCEDURES  Procedure(s) performed: None  Procedures  Critical Care performed: No  ____________________________________________   INITIAL IMPRESSION / ASSESSMENT AND PLAN / ED COURSE  As part of my medical decision making, I reviewed the following data within the electronic MEDICAL RECORD NUMBER    Minor bilateral hip pain.  Patient still under pain management.  Discussed with patient rationale for not prescribing narcotic medication while she is under pain management.  Patient given discharge care instruction in a prescription for Lidoderm patches.  Patient advised follow-up for pain management clinic.      ____________________________________________   FINAL CLINICAL IMPRESSION(S) / ED DIAGNOSES  Final diagnoses:  Acute pain of both hips     ED Discharge Orders         Ordered    lidocaine (LIDODERM) 5 %  Every 12 hours     03/17/18 1156           Note:  This document was prepared using Dragon voice recognition software and may include unintentional dictation errors.    Sable Feil, PA-C 03/17/18 1205    Lisa Roca, MD 03/17/18 [phone]

## 2018-03-17 NOTE — ED Triage Notes (Signed)
A&O x 4, in wheelchair. States had R hip surgery last year, had a second hip in May when rods and pins were taken out, had "hip implant put in" per family member. Here for pain control. States was seeing pain clinic "but I don't like him". Denies recent falls to R hip.

## 2018-03-19 ENCOUNTER — Other Ambulatory Visit: Payer: Self-pay

## 2018-03-19 ENCOUNTER — Encounter: Payer: Self-pay | Admitting: Nurse Practitioner

## 2018-03-19 ENCOUNTER — Ambulatory Visit: Payer: Medicare Other | Attending: Nurse Practitioner | Admitting: Nurse Practitioner

## 2018-03-19 VITALS — BP 154/92 | HR 80 | Temp 98.1°F | Ht 65.0 in | Wt 170.0 lb

## 2018-03-19 DIAGNOSIS — H5462 Unqualified visual loss, left eye, normal vision right eye: Secondary | ICD-10-CM | POA: Diagnosis not present

## 2018-03-19 DIAGNOSIS — M25512 Pain in left shoulder: Secondary | ICD-10-CM | POA: Diagnosis not present

## 2018-03-19 DIAGNOSIS — Z880 Allergy status to penicillin: Secondary | ICD-10-CM | POA: Diagnosis not present

## 2018-03-19 DIAGNOSIS — K219 Gastro-esophageal reflux disease without esophagitis: Secondary | ICD-10-CM | POA: Diagnosis not present

## 2018-03-19 DIAGNOSIS — F329 Major depressive disorder, single episode, unspecified: Secondary | ICD-10-CM | POA: Insufficient documentation

## 2018-03-19 DIAGNOSIS — Z79891 Long term (current) use of opiate analgesic: Secondary | ICD-10-CM | POA: Insufficient documentation

## 2018-03-19 DIAGNOSIS — J449 Chronic obstructive pulmonary disease, unspecified: Secondary | ICD-10-CM | POA: Insufficient documentation

## 2018-03-19 DIAGNOSIS — N183 Chronic kidney disease, stage 3 (moderate): Secondary | ICD-10-CM | POA: Insufficient documentation

## 2018-03-19 DIAGNOSIS — M8000XD Age-related osteoporosis with current pathological fracture, unspecified site, subsequent encounter for fracture with routine healing: Secondary | ICD-10-CM | POA: Insufficient documentation

## 2018-03-19 DIAGNOSIS — Z888 Allergy status to other drugs, medicaments and biological substances status: Secondary | ICD-10-CM | POA: Insufficient documentation

## 2018-03-19 DIAGNOSIS — Z79899 Other long term (current) drug therapy: Secondary | ICD-10-CM | POA: Insufficient documentation

## 2018-03-19 DIAGNOSIS — M5136 Other intervertebral disc degeneration, lumbar region: Secondary | ICD-10-CM | POA: Insufficient documentation

## 2018-03-19 DIAGNOSIS — M25561 Pain in right knee: Secondary | ICD-10-CM | POA: Insufficient documentation

## 2018-03-19 DIAGNOSIS — Z96641 Presence of right artificial hip joint: Secondary | ICD-10-CM | POA: Diagnosis not present

## 2018-03-19 DIAGNOSIS — E7800 Pure hypercholesterolemia, unspecified: Secondary | ICD-10-CM | POA: Insufficient documentation

## 2018-03-19 DIAGNOSIS — M542 Cervicalgia: Secondary | ICD-10-CM | POA: Diagnosis not present

## 2018-03-19 DIAGNOSIS — G8929 Other chronic pain: Secondary | ICD-10-CM

## 2018-03-19 DIAGNOSIS — F419 Anxiety disorder, unspecified: Secondary | ICD-10-CM | POA: Insufficient documentation

## 2018-03-19 DIAGNOSIS — Z8673 Personal history of transient ischemic attack (TIA), and cerebral infarction without residual deficits: Secondary | ICD-10-CM | POA: Insufficient documentation

## 2018-03-19 DIAGNOSIS — Z881 Allergy status to other antibiotic agents status: Secondary | ICD-10-CM | POA: Insufficient documentation

## 2018-03-19 DIAGNOSIS — Z882 Allergy status to sulfonamides status: Secondary | ICD-10-CM | POA: Insufficient documentation

## 2018-03-19 DIAGNOSIS — R413 Other amnesia: Secondary | ICD-10-CM | POA: Insufficient documentation

## 2018-03-19 DIAGNOSIS — M47817 Spondylosis without myelopathy or radiculopathy, lumbosacral region: Secondary | ICD-10-CM | POA: Insufficient documentation

## 2018-03-19 DIAGNOSIS — G894 Chronic pain syndrome: Secondary | ICD-10-CM

## 2018-03-19 DIAGNOSIS — I129 Hypertensive chronic kidney disease with stage 1 through stage 4 chronic kidney disease, or unspecified chronic kidney disease: Secondary | ICD-10-CM | POA: Insufficient documentation

## 2018-03-19 DIAGNOSIS — M533 Sacrococcygeal disorders, not elsewhere classified: Secondary | ICD-10-CM | POA: Insufficient documentation

## 2018-03-19 DIAGNOSIS — M25551 Pain in right hip: Secondary | ICD-10-CM | POA: Diagnosis not present

## 2018-03-19 DIAGNOSIS — M545 Low back pain: Secondary | ICD-10-CM | POA: Diagnosis not present

## 2018-03-19 DIAGNOSIS — Z8249 Family history of ischemic heart disease and other diseases of the circulatory system: Secondary | ICD-10-CM | POA: Insufficient documentation

## 2018-03-19 DIAGNOSIS — M25511 Pain in right shoulder: Secondary | ICD-10-CM | POA: Diagnosis not present

## 2018-03-19 DIAGNOSIS — Z87891 Personal history of nicotine dependence: Secondary | ICD-10-CM | POA: Insufficient documentation

## 2018-03-19 DIAGNOSIS — D649 Anemia, unspecified: Secondary | ICD-10-CM | POA: Diagnosis not present

## 2018-03-19 NOTE — Progress Notes (Signed)
Patient's Name: [name]  MRN: [id number]  Referring Provider: Wenda Low, MD  DOB: Nov 03, 1940  PCP: Patient, No Pcp Per  DOS: 03/19/2018  Note by: Vevelyn Francois NP  Service setting: Ambulatory outpatient  Specialty: Interventional Pain Management  Location: [facility] (AMB) Pain Management Facility    Patient type: Established    Primary Reason(s) for Visit: Encounter for post-procedure evaluation of chronic illness with mild to moderate exacerbation CC: Knee Pain  HPI  Ms. Leavens is a 77 y.o. year old, female patient, who comes today for a post-procedure evaluation and medication management. 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[place]); Hypertensive kidney disease, malignant; Chronic low back pain (Tertiary Area of Pain) (Bilateral) (L>R); CAFL (chronic airflow limitation) ([place]); Apoplectic vertigo; Duodenogastric reflux; History of stroke; Hoarseness; Benign essential HTN; Chronic knee pain (Primary Area of Pain) (Right); Depression, major, in partial remission ([place]); Major depression in remission ([place]); Involutional osteoporosis; Pharyngoesophageal dysphagia; 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); Hx of total knee replacement (Bilateral); Infection of the upper respiratory tract; Bed wetting; UTI (urinary tract infection); Long term current use of opiate analgesic; Long term prescription opiate use; Chronic pain syndrome; Chronic neck pain (Secondary Area of Pain) (Bilateral) (L>R); Chronic hip pain (Right); Rib pain; DDD (degenerative disc disease), cervical; Lumbar facet arthropathy (Bilateral);  Chronic myofascial pain; Lumbar facet syndrome (Bilateral) (L>R); Long term prescription benzodiazepine use; Opiate use; Disorder of skeletal system; Pharmacologic therapy; Problems influencing health status; Opioid-induced constipation (OIC); NSAID long-term use; DDD (degenerative disc disease), lumbar; Chronic knee pain (Bilateral) (R>L); Osteoarthritis; Chronic musculoskeletal pain; Neurogenic pain; Chronic knee pain s/p total knee replacement (TKR) (Right); CKD (chronic kidney disease) stage 3, GFR 30-59 ml/min ([place]); Chronic shoulder pain (Left); Non-traumatic compression fracture of T3 thoracic vertebra, sequela; High risk medication use; At high risk for falls; Chronic sacroiliac joint pain (Right); Spondylosis without myelopathy or radiculopathy, lumbosacral region; Other specified dorsopathies, sacral and sacrococcygeal region; Abnormal x-ray of pelvis; SBO (small bowel obstruction) ([place]); History of small bowel obstruction; Delayed union of closed fracture of hip, right; Primary osteoarthritis of hip; Ileus ([place]); It band syndrome, right; History of hip replacement (Right); Closed hip fracture, sequela (Right); and Acute right hip pain on their problem list. Her primarily concern today is the Knee Pain  Pain Assessment: Location: Right Knee Radiating: move from hip to knee Onset: More than a month ago Duration: Chronic pain Quality: Aching, Constant Severity: 10-Worst pain ever/10 (subjective, self-reported pain score)  Note: Reported level is compatible with observation. Clinically the patient looks like a 2/10 A 2/10 is viewed as "Mild to Moderate" and described as noticeable and distracting. Impossible to hide from other people. More frequent flare-ups. Still possible to adapt and function close to normal. It can be very annoying and may have occasional stronger flare-ups. With discipline, patients may get used to it and adapt. Ms. Hainsworth does not seem to understand the use of our objective  pain scale  Effect on ADL: i cant walk Timing: Constant Modifying factors: nothing helps BP: (!) 154/92  HR: 80  Ms. Felber was last seen on 02/03/2018 for a procedure. During today's appointment we reviewed Ms. Vroom's post-procedure results.  She admits that she has not any improvement in her knee  or hip with the interventional therapy provided. She admits that she is having some difficulty walking.  She did follow-up with orthopedist. She admits that she is healing slowly. She is on a bone stimulator on yesterday along with calcum and vitamin D. She admits that the PT is not an option secondary to her pain.   Further details on both, my assessment(s), as well as the proposed treatment plan, please see below.  Post-Procedure Assessment  02/03/2018 Procedure: Right genicular nerve frequency ablation Pre-procedure pain score:  7/10 Post-procedure pain score: 5/10         Influential Factors: BMI: 28.29 kg/m Intra-procedural challenges: None observed.         Assessment challenges: None detected.              Reported side-effects: None.        Post-procedural adverse reactions or complications: None reported         Sedation: Please see nurses note. When no sedatives are used, the analgesic levels obtained are directly associated to the effectiveness of the local anesthetics. However, when sedation is provided, the level of analgesia obtained during the initial 1 hour following the intervention, is believed to be the result of a combination of factors. These factors may include, but are not limited to: 1. The effectiveness of the local anesthetics used. 2. The effects of the analgesic(s) and/or anxiolytic(s) used. 3. The degree of discomfort experienced by the patient at the time of the procedure. 4. The patients ability and reliability in recalling and recording the events. 5. The presence and influence of possible secondary gains and/or psychosocial factors. Reported result: Relief  experienced during the 1st hour after the procedure: 0 % (Ultra-Short Term Relief)            Interpretative annotation: Clinically appropriate result. Analgesia during this period is likely to be Local Anesthetic and/or IV Sedative (Analgesic/Anxiolytic) related.          Effects of local anesthetic: The analgesic effects attained during this period are directly associated to the localized infiltration of local anesthetics and therefore cary significant diagnostic value as to the etiological location, or anatomical origin, of the pain. Expected duration of relief is directly dependent on the pharmacodynamics of the local anesthetic used. Long-acting (4-6 hours) anesthetics used.  Reported result: Relief during the next 4 to 6 hour after the procedure: 0 % (Short-Term Relief)            Interpretative annotation: Clinically appropriate result. Analgesia during this period is likely to be Local Anesthetic-related.          Long-term benefit: Defined as the period of time past the expected duration of local anesthetics (1 hour for short-acting and 4-6 hours for long-acting). With the possible exception of prolonged sympathetic blockade from the local anesthetics, benefits during this period are typically attributed to, or associated with, other factors such as analgesic sensory neuropraxia, antiinflammatory effects, or beneficial biochemical changes provided by agents other than the local anesthetics.  Reported result: Extended relief following procedure: 0 % (Long-Term Relief)            Interpretative annotation: Clinically possible results. Good relief. No permanent benefit expected. Inflammation plays a part in the etiology to the pain.          Current benefits: Defined as reported results that persistent at this point in time.   Analgesia: 0 %            Function: No improvement ROM:  No improvement Interpretative annotation: Poor/inaccurate patient record keeping and/or recollection. Therapeutic  failure. Results would argue against repeating therapy.          Interpretation: Results would suggest failure of therapy in achieving desired goal(s).                  Plan:  Please see "Plan of Care" for details.                Laboratory Chemistry  Inflammation Markers (CRP: Acute Phase) (ESR: Chronic Phase) Lab Results  Component Value Date   CRP <0.8 11/19/2016   ESRSEDRATE 26 (H) 02/09/2018   LATICACIDVEN 1.72 10/26/2017                         Rheumatology Markers No results found for: RF, ANA, LABURIC, URICUR, LYMEIGGIGMAB, LYMEABIGMQN, HLAB27                      Renal Function Markers Lab Results  Component Value Date   BUN 16 02/09/2018   CREATININE 0.95 02/09/2018   BCR 6 (L) 12/04/2016   GFRAA >60 02/09/2018   GFRNONAA 57 (L) 02/09/2018                             Hepatic Function Markers Lab Results  Component Value Date   AST 26 10/26/2017   ALT 16 10/26/2017   ALBUMIN 3.3 (L) 10/26/2017   ALKPHOS 96 10/26/2017   AMYLASE 68 01/25/2007   LIPASE 22 10/26/2017   AMMONIA 66 12/04/2016                        Electrolytes Lab Results  Component Value Date   NA 141 02/09/2018   K 3.7 02/09/2018   CL 108 02/09/2018   CALCIUM 9.2 02/09/2018   MG 1.7 10/30/2017                        Neuropathy Markers Lab Results  Component Value Date   VITAMINB12 1,888 (H) 11/19/2016   HGBA1C 4.9 09/19/2017                        CNS Tests No results found for: COLORCSF, APPEARCSF, RBCCOUNTCSF, WBCCSF, POLYSCSF, LYMPHSCSF, EOSCSF, PROTEINCSF, GLUCCSF, JCVIRUS, CSFOLI, IGGCSF                      Bone Pathology Markers Lab Results  Component Value Date   25OHVITD1 37 11/19/2016   25OHVITD2 <1.0 11/19/2016   25OHVITD3 37 11/19/2016                         Coagulation Parameters Lab Results  Component Value Date   INR 1.00 09/13/2016   LABPROT 13.2 09/13/2016   APTT 34 01/04/2011   PLT 162 02/09/2018   DDIMER 1.71 (H) 10/23/2016                         Cardiovascular Markers Lab Results  Component Value Date   BNP 60.2 01/23/2016   TROPONINI <0.03 05/23/2015   HGB 12.8 02/09/2018   HCT 40.6 02/09/2018                         CA Markers No  results found for: CEA, CA125, LABCA2                      Note: Lab results reviewed.  Recent Diagnostic Imaging Results  NM Bone Scan 3 Phase CLINICAL DATA:  Constant pain in RIGHT hip since hip replacement in May  EXAM: NUCLEAR MEDICINE 3-PHASE BONE SCAN  TECHNIQUE: Radionuclide angiographic images, immediate static blood pool images, and 3-hour delayed static images were obtained of the hips after intravenous injection of radiopharmaceutical.  RADIOPHARMACEUTICALS:  21.3 mCi Tc-74mMDP IV  COMPARISON:  None  Correlation: CT RIGHT hip 02/17/2018  FINDINGS: Vascular phase: Mildly increased blood flow to the RIGHT hip region the level of proximal femur. Remaining visualized flow at the pelvis and LEFT hip is normal.  Blood pool phase: Photopenic defect at RIGHT hip from hip prosthesis. Mildly increased blood pool is seen at the RIGHT hip region the level of the hip joint and proximal RIGHT femur.  Delayed phase: Abnormal increased delayed localization of tracer is identified adjacent to the proximal distal aspects of the femoral component of the RIGHT hip prosthesis. This pattern is consistent with loosening of the femoral component of the prosthesis. However, it is uncertain as to how much of this uptake could be related to relatively recent interval since surgery. No abnormal tracer localization at the pelvis or LEFT hip/femur.  IMPRESSION: Mildly increased blood flow and blood pool tracer at the RIGHT hip region the level of the proximal femur.  Abnormal increased tracer localization adjacent to the proximal and distal aspects of the femoral component of the RIGHT hip prosthesis on delayed images, a pattern consistent with aseptic loosening of the femoral component  of the prosthesis; however, it is uncertain to what degree the recent interval since surgery contributes to this uptake.  Electronically Signed   By: MLavonia DanaM.D.   On: 03/06/2018 16:54  Complexity Note: Imaging results reviewed. Results shared with Ms. DSciara using Layman's terms.                         Meds   Current Outpatient Medications:  .  albuterol (ACCUNEB) 1.25 MG/3ML nebulizer solution, Take 1 ampule by nebulization every 6 (six) hours as needed for wheezing., Disp: , Rfl:  .  albuterol (PROVENTIL HFA;VENTOLIN HFA) 108 (90 BASE) MCG/ACT inhaler, Inhale 2 puffs into the lungs every 6 (six) hours as needed for wheezing or shortness of breath. , Disp: , Rfl:  .  alendronate (FOSAMAX) 70 MG tablet, Take 70 mg by mouth every Monday. Take with a full glass of water on an empty stomach., Disp: , Rfl:  .  ALPRAZolam (XANAX) 1 MG tablet, Take 1 mg by mouth at bedtime as needed for sleep., Disp: , Rfl:  .  amLODipine (NORVASC) 5 MG tablet, Take 5 mg by mouth daily. , Disp: , Rfl:  .  Calcium-Phosphorus-Vitamin D (CALCIUM/D3 ADULT GUMMIES PO), Take 1 tablet by mouth daily., Disp: , Rfl:  .  citalopram (CELEXA) 20 MG tablet, Take 20 mg by mouth daily. , Disp: , Rfl:  .  docusate sodium (COLACE) 100 MG capsule, Take 200 mg by mouth 2 (two) times daily as needed for moderate constipation. , Disp: , Rfl:  .  esomeprazole (NEXIUM) 40 MG capsule, Take 40 mg by mouth daily. , Disp: , Rfl:  .  Ferrous Sulfate (IRON) 325 (65 Fe) MG TABS, Take 1 tablet (325 mg total) by mouth 2 (two)  times daily. (Patient taking differently: Take 1 tablet by mouth 2 (two) times a week. ), Disp: 60 each, Rfl: 0 .  guaiFENesin (MUCINEX) 600 MG 12 hr tablet, Take 600 mg by mouth 2 (two) times daily as needed for cough., Disp: , Rfl:  .  lidocaine (LIDODERM) 5 %, Place 1 patch onto the skin every 12 (twelve) hours. Remove & Discard patch within 12 hours or as directed by MD, Disp: 10 patch, Rfl: 0 .  linaclotide  (LINZESS) 290 MCG CAPS capsule, Take 290 mcg by mouth daily., Disp: , Rfl:  .  lovastatin (MEVACOR) 10 MG tablet, Take 10 mg by mouth daily., Disp: , Rfl:  .  meloxicam (MOBIC) 15 MG tablet, Take 15 mg by mouth daily. , Disp: , Rfl:  .  Menthol, Topical Analgesic, (BIOFREEZE EX), Apply 1 application topically daily as needed (muscle pain)., Disp: , Rfl:  .  Multiple Vitamins-Minerals (MULTIVITAMIN GUMMIES ADULT PO), Take 1 tablet by mouth daily., Disp: , Rfl:  .  ondansetron (ZOFRAN ODT) 4 MG disintegrating tablet, Take 1 tablet (4 mg total) by mouth every 8 (eight) hours as needed for nausea or vomiting., Disp: 20 tablet, Rfl: 0 .  polyethylene glycol (MIRALAX / GLYCOLAX) packet, Take 17 g by mouth daily as needed (for constipation.). , Disp: , Rfl:  .  pregabalin (LYRICA) 75 MG capsule, Take 1 capsule (75 mg total) by mouth 2 (two) times daily., Disp: 60 capsule, Rfl: 0 .  traZODone (DESYREL) 100 MG tablet, Take 100 mg by mouth at bedtime. , Disp: , Rfl:   ROS  Constitutional: Denies any fever or chills Gastrointestinal: No reported hemesis, hematochezia, vomiting, or acute GI distress Musculoskeletal: Denies any acute onset joint swelling, redness, loss of ROM, or weakness Neurological: No reported episodes of acute onset apraxia, aphasia, dysarthria, agnosia, amnesia, paralysis, loss of coordination, or loss of consciousness  Allergies  Ms. Elsayed is allergic to ampicillin; ampicillin-sulbactam sodium; ambien [zolpidem tartrate]; flexeril [cyclobenzaprine]; tape; toradol [ketorolac tromethamine]; sulfamethoxazole-trimethoprim; zolpidem; cephalexin; and tramadol.  [facility]  Drug: Ms. Dalia  reports that she does not use drugs. Alcohol:  reports that she does not drink alcohol. Tobacco:  reports that she quit smoking about 6 years ago. Her smoking use included e-cigarettes and cigarettes. She has a 45.00 pack-year smoking history. She has never used smokeless tobacco. Medical:  has a past  medical history of Acute postoperative pain (02/03/2018), Anemia, B12 deficiency, Bacteremia (10/07/2014), Bladder incontinence, Blind left eye, Cancer ([place]), Cataract, Cervical spine fracture ([place]), Chest pain (07/29/2013), Compression fracture, COPD (chronic obstructive pulmonary disease) ([place]), DDD (degenerative disc disease), lumbar, Depression, Diffuse myofascial pain syndrome (03/24/2015), Dyspnea, Fever (10/05/2014), GERD (gastroesophageal reflux disease), Hiatal hernia, Hyperlipidemia, Hypertension, Hypertensive kidney disease, malignant (11/07/2016), Macular degeneration, Osteoarthritis, Osteoporosis, Pneumonia, Reactive airway disease, Rupture of bowel ([place]), Sepsis ([place]) (10/08/2014), Stroke [facility]), Stroke ([place]), and Wrist pain, acute (09/10/2012). Surgical: Ms. Sharpnack  has a past surgical history that includes Abdominal surgery; Colon surgery; Appendectomy; Bladder surgery; Rectocele repair; Knee surgery; Cholecystectomy; Abdominal hysterectomy; Ostomy; Eye surgery; Intramedullary (im) nail intertrochanteric (Right, 09/13/2016); Hip surgery (Right, 08/2016); Joint replacement (Bilateral); Toe Surgery (Right); Colostomy reversal; and Total hip arthroplasty (Right, 09/30/2017). Family: family history includes Heart attack in her father; Heart failure in her mother.  Constitutional Exam  General appearance: Well nourished, well developed, and well hydrated. In no apparent acute distress Vitals:   03/19/18 0905  BP: (!) 154/92  Pulse: 80  Temp: 98.1 F (36.7 C)  SpO2: 97%  Weight:  170 lb (77.1 kg)  Height: '5\' 5"'  (1.651 m)  Psych/Mental status: Alert, oriented x 3 (person, place, & time)       Eyes: PERLA Respiratory: No evidence of acute respiratory distress  Gait & Posture Assessment  Ambulation: Patient ambulates using a wheel chair Posture: WNL   Lower Extremity Exam    Side: Right lower extremity  Side: Left lower extremity  Stability: No instability observed          Stability: No  instability observed          Skin & Extremity Inspection: Evidence of prior arthroplastic surgery  Skin & Extremity Inspection: Skin color, temperature, and hair growth are WNL. No peripheral edema or cyanosis. No masses, redness, swelling, asymmetry, or associated skin lesions. No contractures.  Functional ROM: ROM per passive assist by patient causing some grimacing for hip and knee joints          Functional ROM: Unrestricted ROM                  Muscle Tone/Strength: Functionally intact. No obvious neuro-muscular anomalies detected.  Muscle Tone/Strength: Functionally intact. No obvious neuro-muscular anomalies detected.   Assessment  Primary Diagnosis & Pertinent Problem List: The primary encounter diagnosis was Chronic knee pain (Primary Area of Pain) (Right). Diagnoses of Chronic hip pain (Right) and Chronic pain syndrome were also pertinent to this visit.  Status Diagnosis  Stable Stable Stable 1. Chronic knee pain (Primary Area of Pain) (Right)   2. Chronic hip pain (Right)   3. Chronic pain syndrome     Problems updated and reviewed during this visit: No problems updated. Plan of Care  Pharmacotherapy (Medications Ordered): No orders of the defined types were placed in this encounter.  New Prescriptions   No medications on file   Medications administered today: Hiedi L. Head had no medications administered during this visit. Lab-work, procedure(s), and/or referral(s): No orders of the defined types were placed in this encounter.  Imaging and/or referral(s): None  Interventional management options: Planned, scheduled, and/or pending: She has declined any interventional procedures at this time.    Considering: Diagnostic right-sided genicular nerve block#2 Possible right-sided genicular nerve RFA Diagnostic right Femoral + Obturator nerve block #1 Possible right Femoral + Obturator nerve RFA Diagnostic left cervical epidural steroid  injection Diagnostic bilateral cervical facet blocks Diagnostic bilateral intra-articular shoulder joint injection Diagnostic bilateral suprascapular nerve block Possible bilateral suprascapular nerve RFA Diagnosticright-sidedlumbar facet block #3 Possibleright-sidedlumbar facet RFA Diagnostic right-sided sacroiliac joint block #3 Possible right-sided sacroiliac joint RFA   Palliative PRN treatment(s): None at this time    Provider-requested follow-up: Return if symptoms worsen or fail to improve.  Future Appointments  Date Time Provider [place]  04/29/2018  1:30 PM Penumalli, Earlean Polka, MD [facility] None   Primary Care Physician: Patient, No Pcp Per Location: [facility] Note by: Vevelyn Francois NP Date: 03/19/2018; Time: 10:59 AM  Pain Score Disclaimer: We use the NRS-11 scale. This is a self-reported, subjective measurement of pain severity with only modest accuracy. It is used primarily to identify changes within a particular patient. It must be understood that outpatient pain scales are significantly less accurate that those used for research, where they can be applied under ideal controlled circumstances with minimal exposure to variables. In reality, the score is likely to be a combination of pain intensity and pain affect, where pain affect describes the degree of emotional arousal or changes in action readiness caused by  the sensory experience of pain. Factors such as social and work situation, setting, emotional state, anxiety levels, expectation, and prior pain experience may influence pain perception and show large inter-individual differences that may also be affected by time variables.  Patient instructions provided during this appointment: Patient Instructions   ____ BMI Assessment: Estimated body mass index is 28.29 kg/m as calculated from the following:   Height as of this encounter: '5\' 5"'  (1.651 m).   Weight as  of this encounter: 170 lb (77.1 kg).  BMI interpretation table: BMI level Category Range association with higher incidence of chronic pain  <18 kg/m2 Underweight   18.5-24.9 kg/m2 Ideal body weight   25-29.9 kg/m2 Overweight Increased incidence by 20%  30-34.9 kg/m2 Obese (Class I) Increased incidence by 68%  35-39.9 kg/m2 Severe obesity (Class II) Increased incidence by 136%  >40 kg/m2 Extreme obesity (Class III) Increased incidence by 254%   Patient's current BMI Ideal Body weight  Body mass index is 28.29 kg/m. Ideal body weight: 57 kg (125 lb 10.6 oz) Adjusted ideal body weight: 65 kg (143 lb 6.4 oz)   BMI Readings from Last 4 Encounters:  03/19/18 28.29 kg/m  03/17/18 29.12 kg/m  03/05/18 28.29 kg/m  02/26/18 28.84 kg/m   Wt Readings from Last 4 Encounters:  03/19/18 170 lb (77.1 kg)  03/17/18 175 lb (79.4 kg)  03/05/18 170 lb (77.1 kg)  02/26/18 167 lb 15.9 oz (76.2 kg)  ____________________________________________________________________________________________  Pain Scale  Introduction: The pain score used by this practice is the Verbal Numerical Rating Scale (VNRS-11). This is an 11-point scale. It is for adults and children 10 years or older. There are significant differences in how the pain score is reported, used, and applied. Forget everything you learned in the past and learn this scoring system.  General Information: The scale should reflect your current level of pain. Unless you are specifically asked for the level of your worst pain, or your average pain. If you are asked for one of these two, then it should be understood that it is over the past 24 hours.  Basic Activities of Daily Living (ADL): Personal hygiene, dressing, eating, transferring, and using restroom.  Instructions: Most patients tend to report their level of pain as a combination of two factors, their physical pain and their psychosocial pain. This last one is also known as "suffering" and it  is reflection of how physical pain affects you socially and psychologically. From now on, report them separately. From this point on, when asked to report your pain level, report only your physical pain. Use the following table for reference.  Pain Clinic Pain Levels (0-5/10)  Pain Level Score  Description  No Pain 0   Mild pain 1 Nagging, annoying, but does not interfere with basic activities of daily living (ADL). Patients are able to eat, bathe, get dressed, toileting (being able to get on and off the toilet and perform personal hygiene functions), transfer (move in and out of bed or a chair without assistance), and maintain continence (able to control bladder and bowel functions). Blood pressure and heart rate are unaffected. A normal heart rate for a healthy adult ranges from 60 to 100 bpm (beats per minute).   Mild to moderate pain 2 Noticeable and distracting. Impossible to hide from other people. More frequent flare-ups. Still possible to adapt and function close to normal. It can be very annoying and may have occasional stronger flare-ups. With discipline, patients may get used to it and adapt.  Moderate pain 3 Interferes significantly with activities of daily living (ADL). It becomes difficult to feed, bathe, get dressed, get on and off the toilet or to perform personal hygiene functions. Difficult to get in and out of bed or a chair without assistance. Very distracting. With effort, it can be ignored when deeply involved in activities.   Moderately severe pain 4 Impossible to ignore for more than a few minutes. With effort, patients may still be able to manage work or participate in some social activities. Very difficult to concentrate. Signs of autonomic nervous system discharge are evident: dilated pupils (mydriasis); mild sweating (diaphoresis); sleep interference. Heart rate becomes elevated (>115 bpm). Diastolic blood pressure (lower number) rises above 100 mmHg. Patients find relief in  laying down and not moving.   Severe pain 5 Intense and extremely unpleasant. Associated with frowning face and frequent crying. Pain overwhelms the senses.  Ability to do any activity or maintain social relationships becomes significantly limited. Conversation becomes difficult. Pacing back and forth is common, as getting into a comfortable position is nearly impossible. Pain wakes you up from deep sleep. Physical signs will be obvious: pupillary dilation; increased sweating; goosebumps; brisk reflexes; cold, clammy hands and feet; nausea, vomiting or dry heaves; loss of appetite; significant sleep disturbance with inability to fall asleep or to remain asleep. When persistent, significant weight loss is observed due to the complete loss of appetite and sleep deprivation.  Blood pressure and heart rate becomes significantly elevated. Caution: If elevated blood pressure triggers a pounding headache associated with blurred vision, then the patient should immediately seek attention at an urgent or emergency care unit, as these may be signs of an impending stroke.    Emergency Department Pain Levels (6-10/10)  Emergency Room Pain 6 Severely limiting. Requires emergency care and should not be seen or managed at an outpatient pain management facility. Communication becomes difficult and requires great effort. Assistance to reach the emergency department may be required. Facial flushing and profuse sweating along with potentially dangerous increases in heart rate and blood pressure will be evident.   Distressing pain 7 Self-care is very difficult. Assistance is required to transport, or use restroom. Assistance to reach the emergency department will be required. Tasks requiring coordination, such as bathing and getting dressed become very difficult.   Disabling pain 8 Self-care is no longer possible. At this level, pain is disabling. The individual is unable to do even the most "basic" activities such as  walking, eating, bathing, dressing, transferring to a bed, or toileting. Fine motor skills are lost. It is difficult to think clearly.   Incapacitating pain 9 Pain becomes incapacitating. Thought processing is no longer possible. Difficult to remember your own name. Control of movement and coordination are lost.   The worst pain imaginable 10 At this level, most patients pass out from pain. When this level is reached, collapse of the autonomic nervous system occurs, leading to a sudden drop in blood pressure and heart rate. This in turn results in a temporary and dramatic drop in blood flow to the brain, leading to a loss of consciousness. Fainting is one of the body's self defense mechanisms. Passing out puts the brain in a calmed state and causes it to shut down for a while, in order to begin the healing process.    Summary: 1. Refer to this scale when providing [place] with your pain level. 2. Be accurate and careful when reporting your pain level. This will help with your care. 3.  Over-reporting your pain level will lead to loss of credibility. 4. Even a level of 1/10 means that there is pain and will be treated at our facility. 5. High, inaccurate reporting will be documented as "Symptom Exaggeration", leading to loss of credibility and suspicions of possible secondary gains such as obtaining more narcotics, or wanting to appear disabled, for fraudulent reasons. 6. Only pain levels of 5 or below will be seen at our facility. 7. Pain levels of 6 and above will be sent to the Emergency Department and the appointment cancelled. ____________________________________________________________________________________________

## 2018-03-19 NOTE — Patient Instructions (Addendum)
____ BMI Assessment: Estimated body mass index is 28.29 kg/m as calculated from the following:   Height as of this encounter: 5\' 5"  (1.651 m).   Weight as of this encounter: 170 lb (77.1 kg).  BMI interpretation table: BMI level Category Range association with higher incidence of chronic pain  <18 kg/m2 Underweight   18.5-24.9 kg/m2 Ideal body weight   25-29.9 kg/m2 Overweight Increased incidence by 20%  30-34.9 kg/m2 Obese (Class I) Increased incidence by 68%  35-39.9 kg/m2 Severe obesity (Class II) Increased incidence by 136%  >40 kg/m2 Extreme obesity (Class III) Increased incidence by 254%   Patient's current BMI Ideal Body weight  Body mass index is 28.29 kg/m. Ideal body weight: 57 kg (125 lb 10.6 oz) Adjusted ideal body weight: 65 kg (143 lb 6.4 oz)   BMI Readings from Last 4 Encounters:  03/19/18 28.29 kg/m  03/17/18 29.12 kg/m  03/05/18 28.29 kg/m  02/26/18 28.84 kg/m   Wt Readings from Last 4 Encounters:  03/19/18 170 lb (77.1 kg)  03/17/18 175 lb (79.4 kg)  03/05/18 170 lb (77.1 kg)  02/26/18 167 lb 15.9 oz (76.2 kg)  ____________________________________________________________________________________________  Pain Scale  Introduction: The pain score used by this practice is the Verbal Numerical Rating Scale (VNRS-11). This is an 11-point scale. It is for adults and children 10 years or older. There are significant differences in how the pain score is reported, used, and applied. Forget everything you learned in the past and learn this scoring system.  General Information: The scale should reflect your current level of pain. Unless you are specifically asked for the level of your worst pain, or your average pain. If you are asked for one of these two, then it should be understood that it is over the past 24 hours.  Basic Activities of Daily Living (ADL): Personal hygiene, dressing, eating, transferring, and using restroom.  Instructions: Most patients tend to  report their level of pain as a combination of two factors, their physical pain and their psychosocial pain. This last one is also known as "suffering" and it is reflection of how physical pain affects you socially and psychologically. From now on, report them separately. From this point on, when asked to report your pain level, report only your physical pain. Use the following table for reference.  Pain Clinic Pain Levels (0-5/10)  Pain Level Score  Description  No Pain 0   Mild pain 1 Nagging, annoying, but does not interfere with basic activities of daily living (ADL). Patients are able to eat, bathe, get dressed, toileting (being able to get on and off the toilet and perform personal hygiene functions), transfer (move in and out of bed or a chair without assistance), and maintain continence (able to control bladder and bowel functions). Blood pressure and heart rate are unaffected. A normal heart rate for a healthy adult ranges from 60 to 100 bpm (beats per minute).   Mild to moderate pain 2 Noticeable and distracting. Impossible to hide from other people. More frequent flare-ups. Still possible to adapt and function close to normal. It can be very annoying and may have occasional stronger flare-ups. With discipline, patients may get used to it and adapt.   Moderate pain 3 Interferes significantly with activities of daily living (ADL). It becomes difficult to feed, bathe, get dressed, get on and off the toilet or to perform personal hygiene functions. Difficult to get in and out of bed or a chair without assistance. Very distracting. With effort, it  can be ignored when deeply involved in activities.   Moderately severe pain 4 Impossible to ignore for more than a few minutes. With effort, patients may still be able to manage work or participate in some social activities. Very difficult to concentrate. Signs of autonomic nervous system discharge are evident: dilated pupils (mydriasis); mild sweating  (diaphoresis); sleep interference. Heart rate becomes elevated (>115 bpm). Diastolic blood pressure (lower number) rises above 100 mmHg. Patients find relief in laying down and not moving.   Severe pain 5 Intense and extremely unpleasant. Associated with frowning face and frequent crying. Pain overwhelms the senses.  Ability to do any activity or maintain social relationships becomes significantly limited. Conversation becomes difficult. Pacing back and forth is common, as getting into a comfortable position is nearly impossible. Pain wakes you up from deep sleep. Physical signs will be obvious: pupillary dilation; increased sweating; goosebumps; brisk reflexes; cold, clammy hands and feet; nausea, vomiting or dry heaves; loss of appetite; significant sleep disturbance with inability to fall asleep or to remain asleep. When persistent, significant weight loss is observed due to the complete loss of appetite and sleep deprivation.  Blood pressure and heart rate becomes significantly elevated. Caution: If elevated blood pressure triggers a pounding headache associated with blurred vision, then the patient should immediately seek attention at an urgent or emergency care unit, as these may be signs of an impending stroke.    Emergency Department Pain Levels (6-10/10)  Emergency Room Pain 6 Severely limiting. Requires emergency care and should not be seen or managed at an outpatient pain management facility. Communication becomes difficult and requires great effort. Assistance to reach the emergency department may be required. Facial flushing and profuse sweating along with potentially dangerous increases in heart rate and blood pressure will be evident.   Distressing pain 7 Self-care is very difficult. Assistance is required to transport, or use restroom. Assistance to reach the emergency department will be required. Tasks requiring coordination, such as bathing and getting dressed become very difficult.    Disabling pain 8 Self-care is no longer possible. At this level, pain is disabling. The individual is unable to do even the most "basic" activities such as walking, eating, bathing, dressing, transferring to a bed, or toileting. Fine motor skills are lost. It is difficult to think clearly.   Incapacitating pain 9 Pain becomes incapacitating. Thought processing is no longer possible. Difficult to remember your own name. Control of movement and coordination are lost.   The worst pain imaginable 10 At this level, most patients pass out from pain. When this level is reached, collapse of the autonomic nervous system occurs, leading to a sudden drop in blood pressure and heart rate. This in turn results in a temporary and dramatic drop in blood flow to the brain, leading to a loss of consciousness. Fainting is one of the body's self defense mechanisms. Passing out puts the brain in a calmed state and causes it to shut down for a while, in order to begin the healing process.    Summary: 1. Refer to this scale when providing [place] with your pain level. 2. Be accurate and careful when reporting your pain level. This will help with your care. 3. Over-reporting your pain level will lead to loss of credibility. 4. Even a level of 1/10 means that there is pain and will be treated at our facility. 5. High, inaccurate reporting will be documented as "Symptom Exaggeration", leading to loss of credibility and suspicions of possible secondary gains  such as obtaining more narcotics, or wanting to appear disabled, for fraudulent reasons. 6. Only pain levels of 5 or below will be seen at our facility. 7. Pain levels of 6 and above will be sent to the Emergency Department and the appointment cancelled. ____________________________________________________________________________________________

## 2018-03-22 IMAGING — CR DG CHEST 2V
2 series · 2 of 2 positions shown · non-contrast
Comparison: Chest x-ray of March 29, 2016

CLINICAL DATA: Nonproductive cough for several weeks. History of
COPD. Discontinued smoking 5 years ago.

EXAM:
CHEST  2 VIEW

[w chest pa]
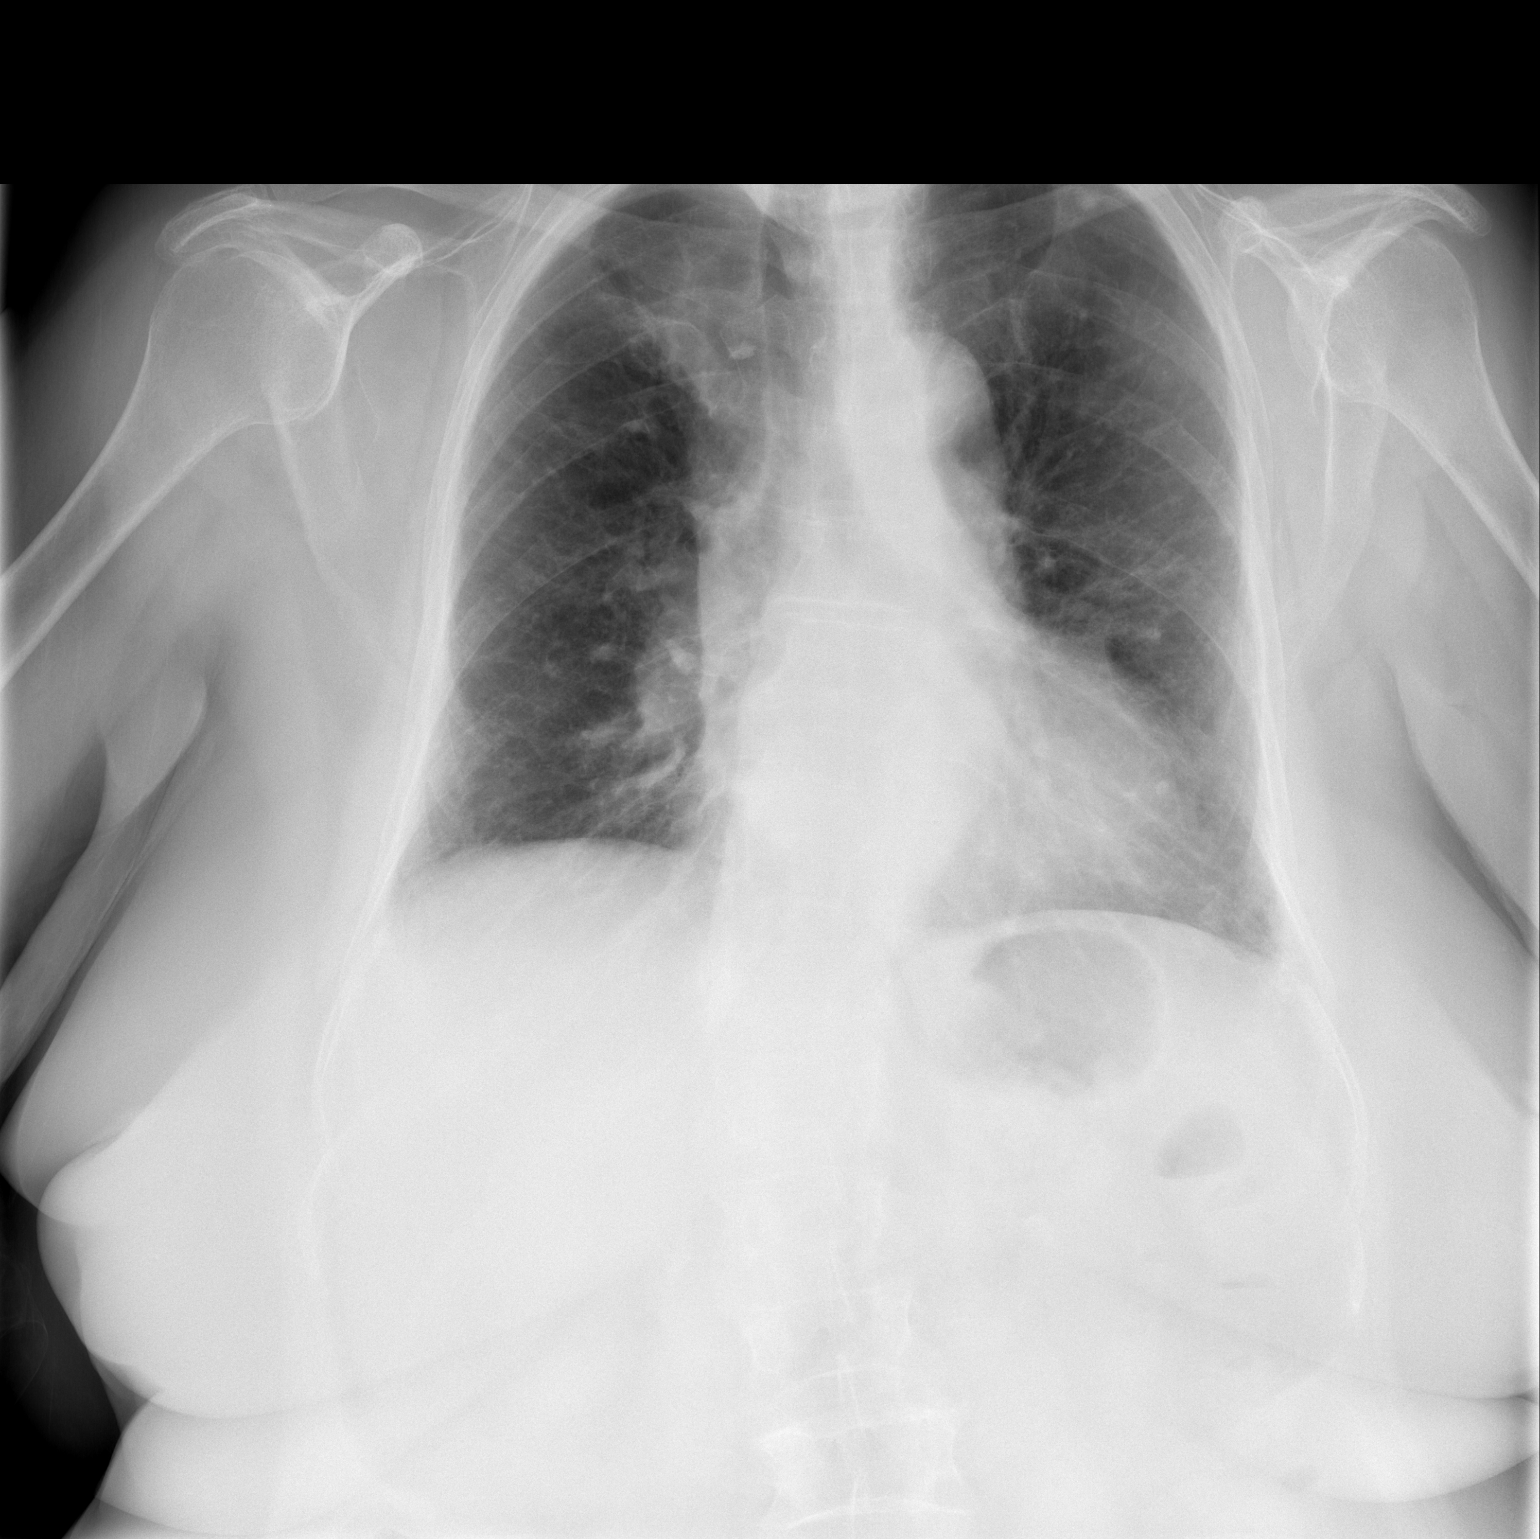

[w chest lat]
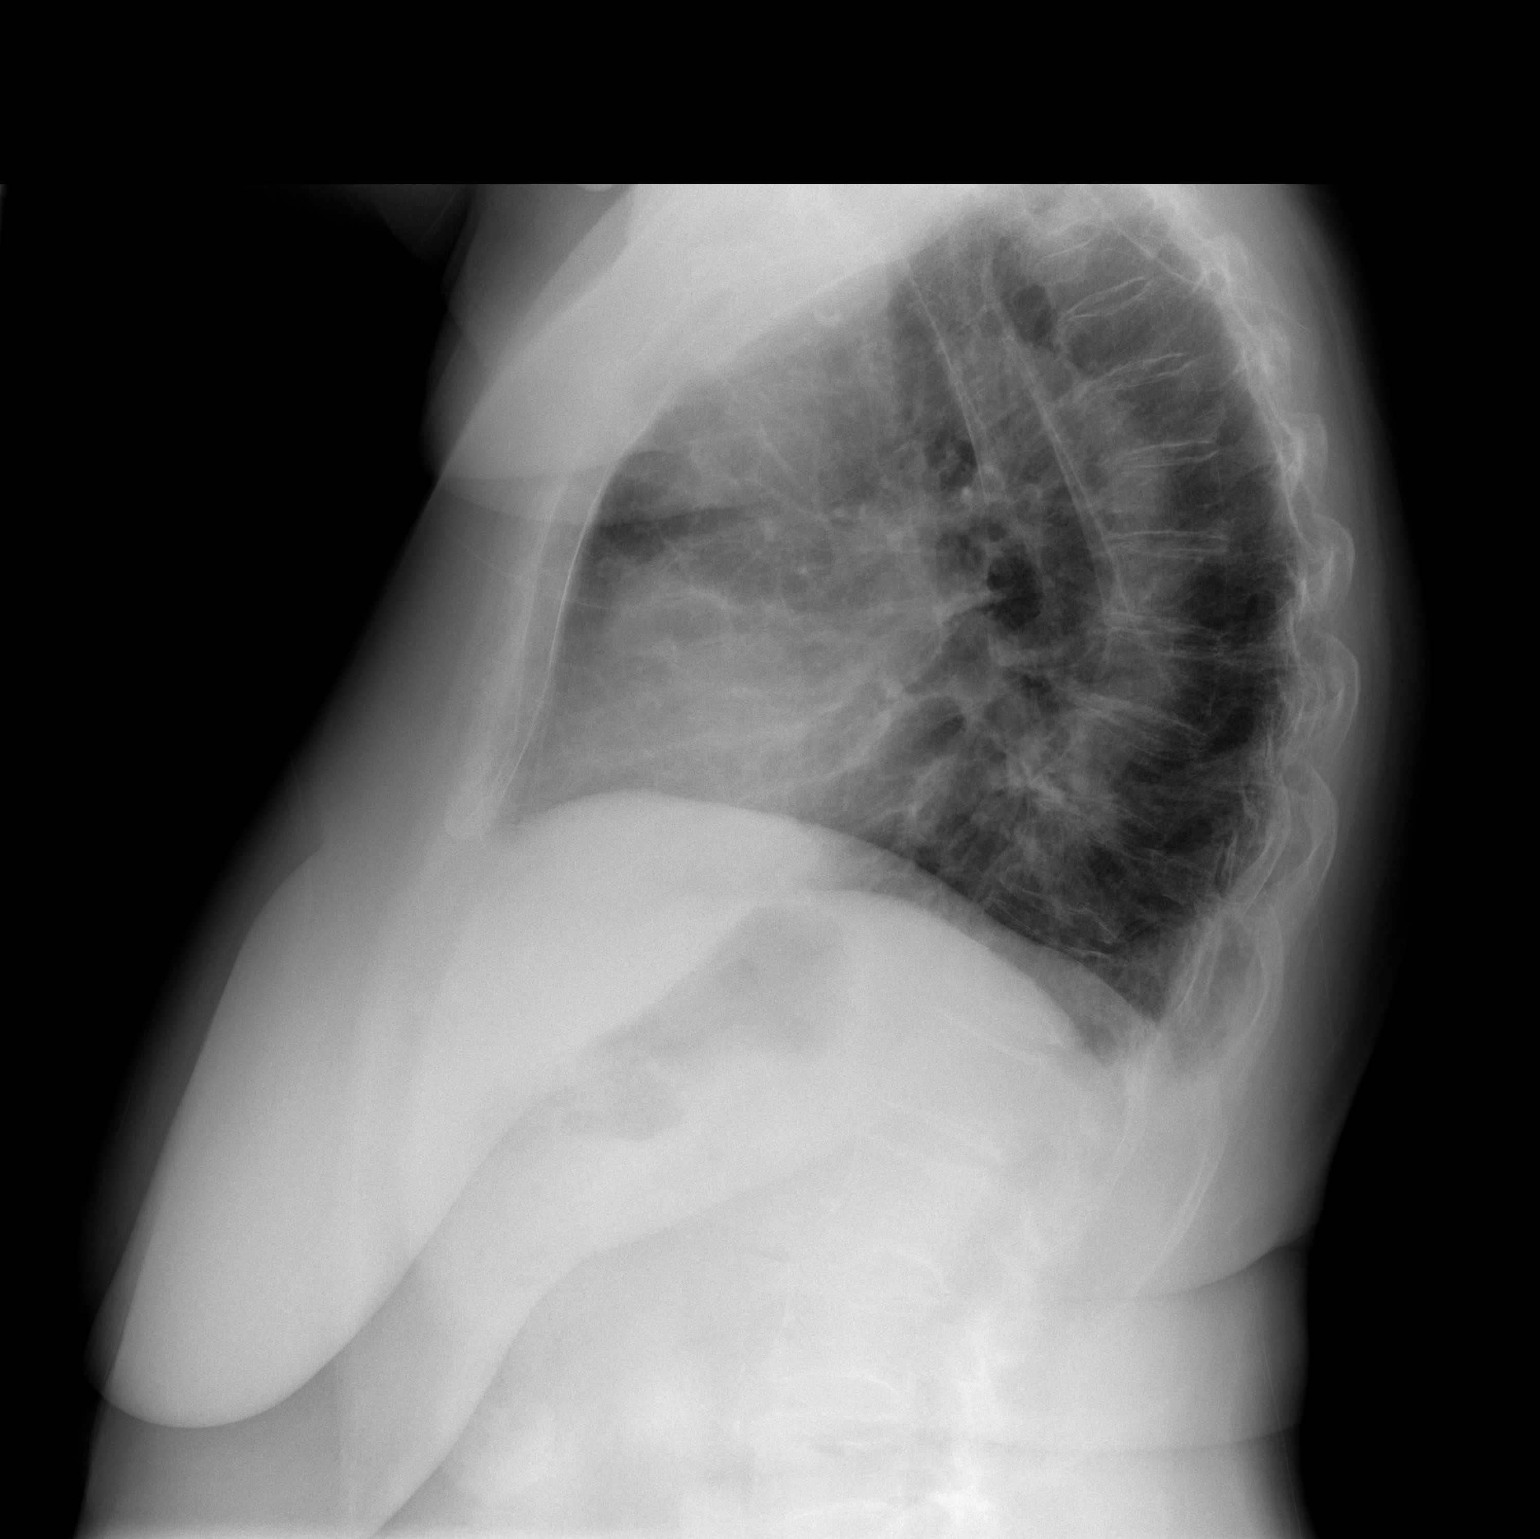

[2 of 2 positions shown; findings below may reference images not displayed]

FINDINGS: The lungs are adequately inflated. The interstitial markings are
coarse bilaterally but are stable. Stable calcified subcentimeter
nodules are present bilaterally. The heart is normal in size. The
pulmonary vascularity is not engorged. There is tortuosity of the
descending thoracic aorta with calcification in the arch. There is
no pleural effusion. There is mild multilevel degenerative disc
disease of the thoracic spine.
IMPRESSION: COPD. No focal pneumonia nor definite CHF. Stable changes of
previous granulomatous infection.

Thoracic aortic atherosclerosis.

## 2018-03-26 ENCOUNTER — Encounter: Payer: Self-pay | Admitting: Pain Medicine

## 2018-03-27 ENCOUNTER — Other Ambulatory Visit: Payer: Self-pay | Admitting: Orthopedic Surgery

## 2018-03-27 DIAGNOSIS — M545 Low back pain, unspecified: Secondary | ICD-10-CM

## 2018-03-29 ENCOUNTER — Emergency Department
Admission: EM | Admit: 2018-03-29 | Discharge: 2018-03-29 | Disposition: A | Discharge status: Home / Self Care | Payer: Medicare Other | Attending: Emergency Medicine | Admitting: Emergency Medicine

## 2018-03-29 ENCOUNTER — Emergency Department: Payer: Medicare Other

## 2018-03-29 ENCOUNTER — Other Ambulatory Visit: Payer: Self-pay

## 2018-03-29 DIAGNOSIS — M79651 Pain in right thigh: Secondary | ICD-10-CM | POA: Insufficient documentation

## 2018-03-29 DIAGNOSIS — Z87891 Personal history of nicotine dependence: Secondary | ICD-10-CM | POA: Diagnosis not present

## 2018-03-29 DIAGNOSIS — W010XXA Fall on same level from slipping, tripping and stumbling without subsequent striking against object, initial encounter: Secondary | ICD-10-CM | POA: Diagnosis not present

## 2018-03-29 DIAGNOSIS — Z96651 Presence of right artificial knee joint: Secondary | ICD-10-CM | POA: Diagnosis not present

## 2018-03-29 DIAGNOSIS — J449 Chronic obstructive pulmonary disease, unspecified: Secondary | ICD-10-CM | POA: Insufficient documentation

## 2018-03-29 DIAGNOSIS — N183 Chronic kidney disease, stage 3 (moderate): Secondary | ICD-10-CM | POA: Diagnosis not present

## 2018-03-29 DIAGNOSIS — I129 Hypertensive chronic kidney disease with stage 1 through stage 4 chronic kidney disease, or unspecified chronic kidney disease: Secondary | ICD-10-CM | POA: Insufficient documentation

## 2018-03-29 DIAGNOSIS — M545 Low back pain, unspecified: Secondary | ICD-10-CM

## 2018-03-29 DIAGNOSIS — M25551 Pain in right hip: Secondary | ICD-10-CM | POA: Insufficient documentation

## 2018-03-29 DIAGNOSIS — Z96641 Presence of right artificial hip joint: Secondary | ICD-10-CM | POA: Diagnosis not present

## 2018-03-29 DIAGNOSIS — W19XXXA Unspecified fall, initial encounter: Secondary | ICD-10-CM

## 2018-03-29 MED ORDER — HYDROMORPHONE HCL 1 MG/ML IJ SOLN
1.0000 mg | Freq: Once | INTRAMUSCULAR | Status: AC
  Administered 2018-03-29: 1 mg via INTRAMUSCULAR
  Filled 2018-03-29: qty 1

## 2018-03-29 NOTE — Discharge Instructions (Addendum)
Turn to the emergency department if you develop new pain, severe pain, numbness tingling or weakness, changes in your bladder or bowel function, or any other symptoms concerning to you.

## 2018-03-29 NOTE — ED Triage Notes (Signed)
Pt comes from home via [facility] with right hip pain. She reports right hip pain for a year and a half but reports falling a few days ago. She had a replacement 6 months ago and pain management stopped 3 months ago. Cries in pain upon movement but easily calmed and distracted. 152/92 per EMS and HR 74.

## 2018-03-29 NOTE — ED Notes (Signed)
Family concerned about patients pain and wants the "problem" fixed, not just pain meds. Dr Mariea Clonts notified and at bedside.

## 2018-03-29 NOTE — ED Notes (Signed)
Reviewed discharge instructions, follow-up care with patient. Patient verbalized understanding of all information reviewed. Patient stable, with no distress noted at this time.    

## 2018-03-29 NOTE — ED Provider Notes (Signed)
[facility] Emergency Department Provider Note  ____________________________________________  Time seen: Approximately 6:43 PM  I have reviewed the triage vital signs and the nursing notes.   HISTORY  Chief Complaint Hip Pain (right)    HPI [name] is a 77 y.o. female with a history of right hip arthroplasty and chronic right hip pain presenting for right hip pain.  The patient is accompanied by her daughter and son, and they are wishing for an "answer" as to why the patient has pain, and treatment for her severe pain.  Per report, the patient has had pain in her right hip and from the knee to the mid femur for years.  She has been treated by an orthopedist, Dr. Lou Miner, who continues to do a significant work-up including recent bone scan which did show increased tracer localization possibly consistent with aseptic loosening of the femoral component of her right hip prosthesis.  For the patient's pain, she has been unsatisfied with recommendations from her orthopedist, her primary care physician, and the pain management clinic.  Her orthopedist has given her a referral to another pain management clinic in [place] but she has not yet been seen there.  She reports that Tylenol, Motrin, heat therapy and cryotherapy have not been working.  She was given Dilaudid the last time she was seen here which did help.  The patient has been seen in this emergency department 3 times in the last month including one visit by me.  She has also been seen in the emergency department at [facility].  In addition, she has had arthrocentesis of the right hip which did not show any evidence of a septic joint.  There is some concern that she has been receiving multiple opioid prescriptions, including from me, from multiple sources.  She has not successfully made a long-term pain management plan with any provider.  The patient reports that due to significant pain, she occasionally  sustains falls.  She states that yesterday she was walking, using her walker, when she had a severe pain in her right leg causing her to fall down onto her right hip.  Since then she is also been having low back pain without any urinary or fecal incontinence, saddle anesthesia, numbness tingling or weakness.  She has been able to ambulate with her chronic pain since then.  Do not injure herself in any other way with the fall.  Past Medical History:  Diagnosis Date  . Acute postoperative pain 02/03/2018  . Anemia   . B12 deficiency   . Bacteremia 10/07/2014  . Bladder incontinence   . Blind left eye   . Cancer ([place])    skin cancer   . Cataract   . Cervical spine fracture ([place])   . Chest pain 07/29/2013  . Compression fracture    C7,  upper T spine -compression fx  . COPD (chronic obstructive pulmonary disease) ([place])   . DDD (degenerative disc disease), lumbar   . Depression    major  . Diffuse myofascial pain syndrome 03/24/2015  . Dyspnea    at times- when activty  . Fever 10/05/2014  . GERD (gastroesophageal reflux disease)   . Hiatal hernia   . Hyperlipidemia   . Hypertension   . 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 Macular degeneration   . Osteoarthritis   . Osteoporosis   . Pneumonia    years ago  . Reactive airway disease   . Rupture of bowel ([place])   .  Sepsis ([place]) 10/08/2014  . Stroke [facility])    "mini stroke"- balance and memory issue  . Stroke ([place])   . Wrist pain, acute 09/10/2012    Patient Active Problem List   Diagnosis Date Noted  . Acute right hip pain 02/26/2018  . Closed hip fracture, sequela (Right) 02/03/2018  . It band syndrome, right 12/18/2017  . History of hip replacement (Right) 12/18/2017  . Ileus ([place]) 10/27/2017  . Primary osteoarthritis of hip 09/30/2017  . History of small bowel obstruction 09/10/2017  . 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 SBO (small bowel obstruction) ([place]) 08/24/2017  . Abnormal x-ray  of pelvis 08/13/2017  . Other specified dorsopathies, sacral and sacrococcygeal region 08/04/2017  . Spondylosis without myelopathy or radiculopathy, lumbosacral region 07/17/2017  . Non-traumatic compression fracture of T3 thoracic vertebra, sequela 07/02/2017  . High risk medication use 07/02/2017  . At high risk for falls 07/02/2017  . Chronic sacroiliac joint pain (Right) 07/02/2017  . CKD (chronic kidney disease) stage 3, GFR 30-59 ml/min (HCC) 04/23/2017  . Chronic knee pain s/p total knee replacement (TKR) (Right) 04/02/2017  . DDD (degenerative disc disease), lumbar 03/05/2017  . Chronic knee pain (Bilateral) (R>L) 03/05/2017  . Osteoarthritis 03/05/2017  . Chronic musculoskeletal pain 03/05/2017  . Neurogenic pain 03/05/2017  . Chronic myofascial pain 02/12/2017  . Lumbar facet syndrome (Bilateral) (L>R) 02/12/2017  . Long term prescription benzodiazepine use 02/12/2017  . Opiate use 02/12/2017  . Disorder of skeletal system 02/12/2017  . Pharmacologic therapy 02/12/2017  . Problems influencing health status 02/12/2017  . 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 NSAID long-term use 02/12/2017  . DDD (degenerative disc disease), cervical 11/11/2016  . Lumbar facet arthropathy (Bilateral) 11/11/2016  . 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 CAFL (chronic airflow limitation) ([place]) 11/07/2016  . Depression, major, in partial remission ([place]) 11/07/2016  . 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 Bed wetting 11/07/2016  . Long term current use of opiate analgesic 11/07/2016  . Long term prescription opiate use 11/07/2016  . Chronic pain syndrome 11/07/2016  . Chronic neck pain (Secondary Area of Pain) (Bilateral) (L>R) 11/07/2016  . Chronic hip pain (Right) 11/07/2016  . Rib pain 11/07/2016  . Age-related osteoporosis with current pathological fracture with routine healing 09/20/2016  . History of stroke 09/20/2016  .  Major depression in remission ([place]) 09/20/2016  . Intertrochanteric fracture of femur, sequela (Right) 09/13/2016  . Fall   . Chronic shoulder pain (Bilateral) 08/12/2016  . Hx of total knee replacement (Bilateral) 04/04/2016  . Chronic shoulder pain (Left) 02/28/2016  . Hoarseness 01/12/2016  . Pharyngoesophageal dysphagia 01/12/2016  . Chronic low back pain [facility] Area of Pain) (Bilateral) (L>R) 10/26/2015  . 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 UTI (urinary tract infection) 06/25/2015  . Syncope, non cardiac 05/22/2015  . Leukopenia 10/07/2014  . Thrombocytopenia ([place]) 10/07/2014  . Hypoxia 10/05/2014  . Anxiety 06/15/2014  . Apoplectic vertigo 06/15/2014  . Duodenogastric reflux 06/15/2014  . Benign essential HTN 06/15/2014  . Infection of the upper respiratory tract 06/15/2014  . Hypercholesteremia 07/30/2013  . HTN (hypertension) 07/30/2013  . GERD (gastroesophageal reflux disease) 07/30/2013  . Dizziness and giddiness 06/18/2013  . Memory loss 05/03/2013  . Chronic knee pain (Primary Area of Pain) (Right) 09/10/2012    Past Surgical History:  Procedure Laterality Date  . ABDOMINAL HYSTERECTOMY    .  ABDOMINAL SURGERY    . APPENDECTOMY    . BLADDER SURGERY    . CHOLECYSTECTOMY    . COLON SURGERY    . COLOSTOMY REVERSAL    . EYE SURGERY     cataracts  . HIP SURGERY Right 08/2016  . INTRAMEDULLARY (IM) NAIL INTERTROCHANTERIC Right 09/13/2016   Procedure: INTRAMEDULLARY (IM) NAIL INTERTROCHANTRIC RIGHT;  Surgeon: Leandrew Koyanagi, MD;  Location: [place];  Service: Orthopedics;  Laterality: Right;  . JOINT REPLACEMENT Bilateral   . KNEE SURGERY    . OSTOMY    . RECTOCELE REPAIR    . TOE SURGERY Right    3rd  . TOTAL HIP ARTHROPLASTY Right 09/30/2017   Procedure: RIGHT HIP IM NAIL REMOVAL WITH CONVERSION TO TOTAL HIP ARTHOPLASTY, anterior approach;  Surgeon: Renette Butters, MD;  Location: [place];   Service: Orthopedics;  Laterality: Right;    Current Outpatient Rx  . Order #: [id number] Class: Historical Med  . Order #: [id number] Class: Historical Med  . Order #: [id number] Class: Historical Med  . Order #: [id number] Class: Historical Med  . Order #: [id number] Class: Historical Med  . Order #: [id number] Class: Historical Med  . Order #: [id number] Class: Historical Med  . Order #: [id number] Class: Historical Med  . Order #: [id number] Class: Historical Med  . Order #: [id number] Class: Print  . Order #: [id number] Class: Historical Med  . Order #: [id number] Class: Normal  . Order #: [id number] Class: Historical Med  . Order #: [id number] Class: Historical Med  . Order #: [id number] Class: Historical Med  . Order #: [id number] Class: Historical Med  . Order #: [id number] Class: Historical Med  . Order #: [id number] Class: Normal  . Order #: [id number] Class: Historical Med  . Order #: [id number] Class: Print  . Order #: [id number] Class: Historical Med    Allergies Ampicillin; Ampicillin-sulbactam sodium; Ambien [zolpidem tartrate]; Flexeril [cyclobenzaprine]; Tape; Toradol [ketorolac tromethamine]; Sulfamethoxazole-trimethoprim; Zolpidem; Cephalexin; and Tramadol  Family History  Problem Relation Age of Onset  . Heart failure Mother   . Heart attack Father     Social History Social History   Tobacco Use  . Smoking status: Former Smoker    Packs/day: 1.50    Years: 30.00    Pack years: 45.00    Types: E-cigarettes, Cigarettes    Last attempt to quit: 2013    Years since quitting: 6.8  . Smokeless tobacco: Never Used  Substance Use Topics  . Alcohol use: No    Alcohol/week: 0.0 standard drinks  . Drug use: No    Review of Systems Constitutional: No fever/chills.  No lightheadedness or syncope.  Positive mechanical fall. Eyes: No visual changes.  No blurred or double vision. ENT: No sore throat. No congestion or rhinorrhea. Cardiovascular: Denies chest pain. Denies palpitations. Respiratory:  Denies shortness of breath.  No cough. Gastrointestinal: No abdominal pain.  No nausea, no vomiting.  No diarrhea.  No constipation. Genitourinary: Negative for dysuria. Musculoskeletal: As of her chronic right hip and right thigh pain.  New diffuse low back pain since her fall. Skin: Negative for rash. Neurological: Negative for headaches. No focal numbness, tingling or weakness.     ____________________________________________   PHYSICAL EXAM:  VITAL SIGNS: ED Triage Vitals [03/29/18 1734]  Enc Vitals Group     BP      Pulse Rate 79     Resp 18     Temp 98.2 F (36.8 C)     Temp Source Oral     SpO2 94 %     Weight 175 lb (79.4 kg)  Height 5\' 5"  (1.651 m)     Head Circumference      Peak Flow      Pain Score 10     Pain Loc      Pain Edu?      Excl. in [place]?     Constitutional: Alert and oriented. Answers questions appropriately.  Uncomfortable appearing and has pain with movements of her right lower extremity. Eyes: Conjunctivae are normal.  EOMI. No scleral icterus. Head: Atraumatic. Nose: No congestion/rhinnorhea. Mouth/Throat: Mucous membranes are moist.  Neck: No stridor.  Supple.  No JVD.  No meningismus.  No midline C-spine tenderness to palpation, step-offs or deformities. Cardiovascular: Normal rate, regular rhythm. No murmurs, rubs or gallops.  Respiratory: Normal respiratory effort.  No accessory muscle use or retractions. Lungs CTAB.  No wheezes, rales or ronchi. Gastrointestinal: Soft, nontender and nondistended.  No guarding or rebound.  No peritoneal signs. Musculoskeletal: No LE edema. No ttp in the calves or palpable cords.  Negative Homan's sign.  The patient has full range of motion of the left hip, left knee and bilateral ankles without pain.  She has normal DP and PT pulses bilaterally.  Her cap refill is less than 2 seconds bilaterally.  She has normal sensation to light touch in the bilateral lower extremities.  She has 5 out of 5 dorsiflexion and  plantar flexion.  The patient has significant pain with range of motion of the right hip and the right knee.  Patient has no midline T-spine tenderness to palpation, step-offs or deformities.  She has diffuse midline and lateral back pain in the entirety of the lumbar spine except for right above the sacrum without palpable step-offs or deformities.  There is no overlying skin abnormality including bruising or abrasion. Neurologic:  A&Ox3.  Speech is clear.  Face and smile are symmetric.  EOMI.  Moves all extremities well. Skin:  Skin is warm, dry and intact. No rash noted. Psychiatric: Mood and affect are normal.   ____________________________________________   LABS (all labs ordered are listed, but only abnormal results are displayed)  Labs Reviewed - No data to display ____________________________________________  EKG  Not indicated ____________________________________________  RADIOLOGY  Dg Lumbar Spine Complete  Result Date: 03/29/2018 CLINICAL DATA:  Fall with right hip pain. EXAM: LUMBAR SPINE - COMPLETE 4+ VIEW COMPARISON:  03/26/2018 lumbar spine MRI FINDINGS: This report assumes 5 non rib-bearing lumbar vertebrae. Lumbar vertebral body heights are stable from recent MRI, with no fracture. Mild multilevel lumbar degenerative disc disease, most prominent at L1-2 and L3-4. No spondylolisthesis. No significant facet arthropathy. No aggressive appearing focal osseous lesions. Abdominal aortic atherosclerosis. Partially visualized right total hip arthroplasty. IMPRESSION: 1. No acute osseous abnormality. 2. Mild multilevel lumbar degenerative disc disease. Electronically Signed   By: Ilona Sorrel M.D.   On: 03/29/2018 19:41   Dg Hip Unilat W Or Wo Pelvis 2-3 Views Right  Result Date: 03/29/2018 CLINICAL DATA:  Right hip pain. Fall a few days ago. Replacement 6 months ago. EXAM: DG HIP (WITH OR WITHOUT PELVIS) 2-3V RIGHT COMPARISON:  February 09, 2018 FINDINGS: The patient is status  post right hip replacement. Acetabular and femoral components are in good position. No dislocation. No fractures identified. No evidence of hardware failure. IMPRESSION: Right hip replacement. No acute fracture noted. No evidence of hardware failure. Electronically Signed   By: Dorise Bullion III M.D   On: 03/29/2018 18:27    ____________________________________________   PROCEDURES  Procedure(s) performed: None  Procedures  Critical Care performed: No ____________________________________________   INITIAL IMPRESSION / ASSESSMENT AND PLAN / ED COURSE  Pertinent labs & imaging results that were available during my care of the patient were reviewed by me and considered in my medical decision making (see chart for details).  77 y.o. female with a history of chronic right hip and thigh pain presenting with right hip and thigh pain, now new back pain after a fall.  Overall, the patient is hematin and with a stable and afebrile.  Her clinical presentation is similar to how I have seen her in the past; I saw her 02/26/2018.  Today, her x-ray does not show any new fractures or evidence of severe trauma from her fall.  We will also get an x-ray of her low back for evaluation.  Today, there is no evidence for cauda equina syndrome or spinal cord compression.  I have had a long conversation with the patient and her family about a pain management plan.  Here, we will treat the patient with intramuscular Dilaudid 1 time to help with her acute pain.  However, I am unable to offer her additional narcotic medications from the emergency department, as I have already given her prior prescription, and she has continued to see multiple providers without making a long-term pain management plan.  I have encouraged her to follow-up with the pain management team recommended by her orthopedist.  Have also encouraged her to continue to use her walker and take all precautions to prevent falls.  The patient's back x-ray  is negative, we will plan to discharge her home with continued orthopedic and pain management follow-up.  She and her family understand return precautions as well as follow-up instructions.  ----------------------------------------- 7:48 PM on 03/29/2018 -----------------------------------------  The patient's x-ray is negative for any fracture in the lumbar spine.  At this time, we will plan to discharge the patient home.    ____________________________________________  FINAL CLINICAL IMPRESSION(S) / ED DIAGNOSES  Final diagnoses:  Right hip pain  Right thigh pain  Fall, initial encounter  Acute bilateral low back pain without sciatica         NEW MEDICATIONS STARTED DURING THIS VISIT:  New Prescriptions   No medications on file      Eula Listen, MD 03/29/18 1949

## 2018-04-01 ENCOUNTER — Ambulatory Visit: Payer: Medicare Other | Admitting: Pain Medicine

## 2018-04-02 ENCOUNTER — Other Ambulatory Visit: Payer: Self-pay | Admitting: *Deleted

## 2018-04-02 NOTE — Patient Outreach (Signed)
[facility]) Care Management  04/02/2018  [name] 1940-12-06 [id number]   Telephone Screen  Referral Date:  04/01/2018 Referral Source:  [facility] ED Census Reason for Referral:  6 or more ED visits in the past 6 months Insurance:  [employer] Attempt:  Successful telephone outreach to patient for telephone screening.  HIPAA verified with patient.  RN Health Coach reviewed [facility] services with patient.  Patient declining [facility] services at this time and does not wish to complete the screening process, states she only needs pain medication.  Does agree to have [place] mailed to her home.  Encouraged patient to contact [facility] in the future if she feels she would be of benefit for services.  Plan:  RN Health Coach will close case based on patient declining services at this time.   [employer] [phone] [email or web address]

## 2018-04-05 NOTE — Progress Notes (Signed)
Patient's Name: [name]  MRN: [id number]  Referring Provider: No ref. provider found  DOB: 1940/06/26  PCP: Alvester Chou, NP  DOS: 04/06/2018  Note by: Gaspar Cola, MD  Service setting: Ambulatory outpatient  Specialty: Interventional Pain Management  Location: [facility] (AMB) Pain Management Facility    Patient type: Established   Primary Reason(s) for Visit: Evaluation of chronic illnesses with exacerbation, or progression (Level of risk: moderate) CC: Hip Pain (right) and Leg Pain (right anterior and lateral thigh)  HPI  [name] is a 77 y.o. year old, female patient, who comes today for a follow-up evaluation. 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[place]); Hypertensive kidney disease, malignant; Chronic low back pain (Tertiary Area of Pain) (Bilateral) (L>R); CAFL (chronic airflow limitation) ([place]); Apoplectic vertigo; Duodenogastric reflux; History of stroke; Hoarseness; Benign essential HTN; Chronic knee pain (Primary Area of Pain) (Right); Depression, major, in partial remission ([place]); Major depression in remission ([place]); Involutional osteoporosis; Pharyngoesophageal dysphagia; 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); Hx of total knee replacement (Bilateral); Infection of the upper respiratory tract; Bed wetting; UTI (urinary tract infection); Long term current use of opiate analgesic; Long term prescription opiate use; Chronic pain syndrome; Chronic neck pain (Secondary Area of Pain) (Bilateral) (L>R); Chronic hip pain (Right); Rib pain; DDD (degenerative disc disease), cervical; Lumbar facet  arthropathy (Bilateral); Chronic myofascial pain; Lumbar facet syndrome (Bilateral) (L>R); Long term prescription benzodiazepine use; Opiate use; Disorder of skeletal system; Pharmacologic therapy; Problems influencing health status; Opioid-induced constipation (OIC); NSAID long-term use; DDD (degenerative disc disease), lumbar; Chronic knee pain (Bilateral) (R>L); Osteoarthritis; Chronic musculoskeletal pain; Neurogenic pain; Chronic knee pain s/p total knee replacement (TKR) (Right); CKD (chronic kidney disease) stage 3, GFR 30-59 ml/min ([place]); Chronic shoulder pain (Left); Non-traumatic compression fracture of T3 thoracic vertebra, sequela; High risk medication use; At high risk for falls; Chronic sacroiliac joint pain (Right); Spondylosis without myelopathy or radiculopathy, lumbosacral region; Other specified dorsopathies, sacral and sacrococcygeal region; Abnormal x-ray of pelvis; SBO (small bowel obstruction) ([place]); History of small bowel obstruction; Delayed union of closed fracture of hip, right; Primary osteoarthritis of hip; Ileus ([place]); It band syndrome, right; History of hip replacement (Right); Closed hip fracture, sequela (Right); and Acute right hip pain on their problem list. [name] was last seen on 02/27/2018. Her primarily concern today is the Hip Pain (right) and Leg Pain (right anterior and lateral thigh)  Pain Assessment: Location: Right Hip Radiating: leg right Onset: More than a month ago Duration: Chronic pain Quality: Aching, Constant(twisting,sharp, pulling) Severity: 10-Worst pain ever/10 (subjective, self-reported pain score)  Note: Reported level is compatible with observation.                         When using our objective Pain Scale, levels between 6 and 10/10 are said to belong in an emergency room, as it progressively worsens from a 6/10, described as severely limiting, requiring emergency care not usually available at an outpatient pain management facility. At a 6/10  level, communication becomes difficult and requires great effort. Assistance to reach the emergency department may be required. Facial flushing and profuse sweating along with potentially dangerous increases in heart rate and blood pressure will be evident. Effect  on ADL: cant walk Timing: Constant Modifying factors: rest, dilaudid injection BP: (!) 93/55  HR: (!) 54  Further details on both, my assessment(s), as well as the proposed treatment plan, please see below.  Laboratory Chemistry  Inflammation Markers (CRP: Acute Phase) (ESR: Chronic Phase) Lab Results  Component Value Date   CRP <0.8 11/19/2016   ESRSEDRATE 26 (H) 02/09/2018   LATICACIDVEN 1.72 10/26/2017                         Rheumatology Markers No results found.  Renal Function Markers Lab Results  Component Value Date   BUN 16 02/09/2018   CREATININE 0.95 02/09/2018   BCR 6 (L) 12/04/2016   GFRAA >60 02/09/2018   GFRNONAA 57 (L) 02/09/2018                             Hepatic Function Markers Lab Results  Component Value Date   AST 26 10/26/2017   ALT 16 10/26/2017   ALBUMIN 3.3 (L) 10/26/2017   ALKPHOS 96 10/26/2017   AMYLASE 68 01/25/2007   LIPASE 22 10/26/2017   AMMONIA 66 12/04/2016                        Electrolytes Lab Results  Component Value Date   NA 141 02/09/2018   K 3.7 02/09/2018   CL 108 02/09/2018   CALCIUM 9.2 02/09/2018   MG 1.7 10/30/2017                        Neuropathy Markers Lab Results  Component Value Date   VITAMINB12 1,888 (H) 11/19/2016   HGBA1C 4.9 09/19/2017                        CNS Tests No results found.  Bone Pathology Markers Lab Results  Component Value Date   25OHVITD1 37 11/19/2016   25OHVITD2 <1.0 11/19/2016   25OHVITD3 37 11/19/2016                         Coagulation Parameters Lab Results  Component Value Date   INR 1.00 09/13/2016   LABPROT 13.2 09/13/2016   APTT 34 01/04/2011   PLT 162 02/09/2018   DDIMER 1.71 (H) 10/23/2016                         Cardiovascular Markers Lab Results  Component Value Date   BNP 60.2 01/23/2016   TROPONINI <0.03 05/23/2015   HGB 12.8 02/09/2018   HCT 40.6 02/09/2018                         CA Markers No results found.  Note: Lab results reviewed.  Imaging Review  Cervical Imaging: Cervical MR wo contrast:  Results for orders placed during the hospital encounter of 10/07/14  MR Cervical Spine Wo Contrast   Narrative CLINICAL DATA:  Known cervical spine fracture, new onset fever. Here for assessment of sepsis.  EXAM: MRI CERVICAL AND LUMBAR SPINE WITHOUT CONTRAST  TECHNIQUE: Multiplanar and multiecho pulse sequences of the cervical spine, to include the craniocervical junction and cervicothoracic junction, and thoracic spine, were obtained without intravenous contrast. Please note, motion degraded examination. Per technologist note, patient did not wish to receive intravenous contrast due  to pain, examination is noncontrast.  COMPARISON:  CT of the cervical spine Oct 05, 2014  FINDINGS: MRI CERVICAL SPINE FINDINGS  Motion degraded examination.  Moderate C7 compression fracture, low T1 and bright T2 signal with 30-50% height loss. No retropulsed bony fragments. Remaining cervical vertebral bodies appear intact. Maintenance of cervical lordosis. Intervertebral discs demonstrate normal morphology, decreased T2 signal within all cervical disc most consistent with desiccation.  Low signal measuring up to 6 mm in AP dimension posterior to the odontoid process consistent with pannus, can be seen with CPPD without erosions or edema. Cervical spinal cord appears normal morphology and signal characteristics though, motion degrades sensitivity for subtle cord signal abnormality. No syrinx. Included prevertebral and paraspinal soft tissues are normal.  Level by level evaluation:  C2-3: Moderate facet arthropathy. No disc bulge, canal stenosis or neural foraminal  narrowing.  C3-4: Annular bulging, uncovertebral hypertrophy. Moderate LEFT greater than RIGHT facet arthropathy. Very mild canal stenosis. Mild LEFT neural foraminal narrowing.  C4-5: Small broad-based disc bulge, mild to moderate facet arthropathy and uncovertebral hypertrophy. Moderate canal stenosis. Moderate to severe LEFT greater than RIGHT neural foraminal narrowing.  C5-6: Small broad-based disc bulge, uncovertebral hypertrophy and moderate facet arthropathy. Mild canal stenosis. Moderate RIGHT, moderate to severe LEFT neural foraminal narrowing.  C6-7: Severely limited by motion and habitus. No definite disc bulge. At least moderate facet arthropathy without canal stenosis or neural foraminal narrowing.  C7-T1: No definite disc bulge, canal stenosis. At least mild facet arthropathy without neural foraminal narrowing.  MRI THORACIC SPINE FINDINGS  Moderate acute appearing T2 compression fracture with 30-50% height loss. Mild acute appearing T3 compression fracture. The remaining thoracic vertebral bodies appear intact. Maintenance of thoracic kyphosis. Intervertebral discs demonstrate relatively preserved morphology and signal characteristics. Minimal subacute to chronic discogenic endplate change of lower thoracic spine. 13 mm T6 hemangioma. Scattered chronic Schmorl's nodes.  Thoracic spinal cord appears normal in morphology and signal characteristics of the level the conus medullaris which terminates at T12-L1. Motion decreases sensitivity for potential cord signal abnormality.  No significant disc bulge, canal stenosis or neural foraminal narrowing any thoracic level.  IMPRESSION: Please note, examination was ordered with contrast though, after noncontrast portion, patient was unable to tolerate imaging due to pain and contrast-enhanced sequences not performed.  MRI CERVICAL SPINE: Acute moderate C7 compression fracture. No malalignment.  Degenerative  cervical spine result in moderate canal stenosis at C4-5, mild at C5-6. Multilevel neural foraminal narrowing: Moderate to severe on the LEFT at C4-5 and C5-6.  MRI THORACIC SPINE: Acute moderate T2 compression fracture. Mild acute T3 compression fracture. No malalignment.   Electronically Signed   By: Elon Alas   On: 10/08/2014 04:30    Cervical CT wo contrast:  Results for orders placed during the hospital encounter of 06/25/17  CT Cervical Spine Wo Contrast   Narrative CLINICAL DATA:  Fall at home [place] being on right side. Head injury. C-spine trauma with high clinical risk.  EXAM: CT HEAD WITHOUT CONTRAST  CT CERVICAL SPINE WITHOUT CONTRAST  TECHNIQUE: Multidetector CT imaging of the head and cervical spine was performed following the standard protocol without intravenous contrast. Multiplanar CT image reconstructions of the cervical spine were also generated.  COMPARISON:  09/13/2016  FINDINGS: CT HEAD FINDINGS  Brain: No evidence of acute infarction, hemorrhage, hydrocephalus, extra-axial collection or mass lesion/mass effect. Mild chronic small vessel ischemia in the periventricular white matter. Mild for age cerebral volume loss.  Vascular: Atherosclerotic calcification.  No hyperdense vessel.  Skull: Negative for fracture  Sinuses/Orbits: No evidence of injury. Right cataract resection and bilateral staphyloma.  CT CERVICAL SPINE FINDINGS  Alignment: No traumatic malalignment.  Skull base and vertebrae: Remote compression fractures of C7 and T2. No acute fracture.  Soft tissues and spinal canal: No prevertebral fluid or swelling. No visible canal hematoma.  Disc levels:  Usual degenerative changes.  Upper chest: No evidence of injury.  IMPRESSION: No evidence of acute intracranial or cervical spine injury.   Electronically Signed   By: Monte Fantasia M.D.   On: 06/25/2017 10:19    Shoulder Imaging: Shoulder-L DG:  Results for  orders placed during the hospital encounter of 03/26/16  DG Shoulder Left   Narrative CLINICAL DATA:  Syncopal episode, falling onto the left shoulder was subsequent pain.  EXAM: LEFT SHOULDER - 2+ VIEW  COMPARISON:  04/24/2015  FINDINGS: Humeral head is properly located. No evidence of regional fracture. No degenerative change. Normal humeral acromial distance.  IMPRESSION: Negative radiographs.   Electronically Signed   By: Nelson Chimes M.D.   On: 03/26/2016 15:26    Thoracic Imaging: Thoracic MR wo contrast:  Results for orders placed during the hospital encounter of 03/18/17  MR THORACIC SPINE WO CONTRAST   Narrative CLINICAL DATA:  Initial evaluation for low back pain radiating into right hip and lower extremity. Recent right hip fracture.  EXAM: MRI THORACIC AND LUMBAR SPINE WITHOUT CONTRAST  TECHNIQUE: Multiplanar and multiecho pulse sequences of the thoracic and lumbar spine were obtained without intravenous contrast.  COMPARISON:  None available.  FINDINGS: MRI THORACIC SPINE FINDINGS  Alignment: Mild exaggeration of the normal thoracic kyphosis. No listhesis or subluxation.  Vertebrae: Mild chronic height loss at the T1 through T4 vertebral bodies. Vertebral body heights otherwise maintained. No evidence for acute for subacute fracture. Bone marrow signal intensity within normal limits. Prominent 14 mm hemangioma noted within the T6 vertebral body. No other discrete or worrisome osseous lesions.  Cord:  Signal intensity within the thoracic spinal cord is normal.  Paraspinal and other soft tissues: Paraspinous soft tissues within normal limits. Partially visualized lungs are grossly clear. Scattered T2 hyperintense cyst noted within the kidneys bilaterally.  Disc levels:  T9-10: Tiny left paracentral disc protrusion indents the ventral thecal sac (series 15, image 26). No stenosis.  T11-12: Shallow central disc protrusion mildly flattens the  ventral thecal sac (series 15, image 33). No stenosis.  No other significant degenerative changes within the thoracic spine. No canal or foraminal stenosis.  MRI LUMBAR SPINE FINDINGS  Segmentation: When counting from the dens, there appear to be only 11 pairs of ribs. For the purposes of this dictation, the lowest well-formed disc space will be labeled the L5-S1 level, and the highest non rib-bearing vertebral body will be labeled L1.  Alignment: Mild rotoscoliosis. Vertebral bodies otherwise normally aligned with preservation of the normal lumbar lordosis. No listhesis.  Vertebrae: Vertebral body heights are maintained. No evidence for acute or chronic fracture. The bone marrow signal intensity within normal limits. No discrete or worrisome osseous lesions. No abnormal marrow edema.  Conus medullaris: Extends to the L1-2 level and appears normal.  Paraspinal and other soft tissues: Paraspinous soft tissues demonstrate no acute abnormality. Scattered T2 hyperintense cyst noted within the kidneys, greater on the left. Visualized visceral structures otherwise unremarkable.  Disc levels:  L1-2:  Unremarkable.  L2-3:  Unremarkable.  L3-4: Diffuse degenerative disc bulge with disc desiccation. Annular fissure noted at the level of the right neural foramen.  Moderate facet and ligamentum flavum hypertrophy. Reactive effusions present within the bilateral L3-4 facets. Mild canal and bilateral foraminal stenosis without neural impingement.  L4-5: Mild disc bulge with disc desiccation. Small posterior annular fissure noted. Mild facet and ligamentum flavum hypertrophy. No significance canal stenosis. Mild bilateral L4 foraminal narrowing without impingement.  L5-S1: Normal disc. Mild facet hypertrophy. No significant stenosis.  IMPRESSION: MR THORACIC SPINE IMPRESSION  1. Mild degenerative disc bulging at T9-10 and T11-12 without significant stenosis. 2. Otherwise  unremarkable MRI of the thoracic spine. No other significant disc pathology. No canal or neural foraminal stenosis.  MR LUMBAR SPINE IMPRESSION  1. Degenerative disc bulge with facet hypertrophy at L3-4 with resultant mild canal and bilateral foraminal stenosis. 2. Degenerative disc bulge at L4-5 with resultant mild bilateral L4 foraminal narrowing. 3. Otherwise negative MRI of the lumbar spine. No other significant stenosis or evidence for neural impingement.   Electronically Signed   By: Jeannine Boga M.D.   On: 03/18/2017 15:33    Lumbosacral Imaging: Lumbar MR wo contrast:  Results for orders placed during the hospital encounter of 03/18/17  MR LUMBAR SPINE WO CONTRAST   Narrative CLINICAL DATA:  Initial evaluation for low back pain radiating into right hip and lower extremity. Recent right hip fracture.  EXAM: MRI THORACIC AND LUMBAR SPINE WITHOUT CONTRAST  TECHNIQUE: Multiplanar and multiecho pulse sequences of the thoracic and lumbar spine were obtained without intravenous contrast.  COMPARISON:  None available.  FINDINGS: MRI THORACIC SPINE FINDINGS  Alignment: Mild exaggeration of the normal thoracic kyphosis. No listhesis or subluxation.  Vertebrae: Mild chronic height loss at the T1 through T4 vertebral bodies. Vertebral body heights otherwise maintained. No evidence for acute for subacute fracture. Bone marrow signal intensity within normal limits. Prominent 14 mm hemangioma noted within the T6 vertebral body. No other discrete or worrisome osseous lesions.  Cord:  Signal intensity within the thoracic spinal cord is normal.  Paraspinal and other soft tissues: Paraspinous soft tissues within normal limits. Partially visualized lungs are grossly clear. Scattered T2 hyperintense cyst noted within the kidneys bilaterally.  Disc levels:  T9-10: Tiny left paracentral disc protrusion indents the ventral thecal sac (series 15, image 26). No  stenosis.  T11-12: Shallow central disc protrusion mildly flattens the ventral thecal sac (series 15, image 33). No stenosis.  No other significant degenerative changes within the thoracic spine. No canal or foraminal stenosis.  MRI LUMBAR SPINE FINDINGS  Segmentation: When counting from the dens, there appear to be only 11 pairs of ribs. For the purposes of this dictation, the lowest well-formed disc space will be labeled the L5-S1 level, and the highest non rib-bearing vertebral body will be labeled L1.  Alignment: Mild rotoscoliosis. Vertebral bodies otherwise normally aligned with preservation of the normal lumbar lordosis. No listhesis.  Vertebrae: Vertebral body heights are maintained. No evidence for acute or chronic fracture. The bone marrow signal intensity within normal limits. No discrete or worrisome osseous lesions. No abnormal marrow edema.  Conus medullaris: Extends to the L1-2 level and appears normal.  Paraspinal and other soft tissues: Paraspinous soft tissues demonstrate no acute abnormality. Scattered T2 hyperintense cyst noted within the kidneys, greater on the left. Visualized visceral structures otherwise unremarkable.  Disc levels:  L1-2:  Unremarkable.  L2-3:  Unremarkable.  L3-4: Diffuse degenerative disc bulge with disc desiccation. Annular fissure noted at the level of the right neural foramen. Moderate facet and ligamentum flavum hypertrophy. Reactive effusions present within the bilateral L3-4 facets. Mild  canal and bilateral foraminal stenosis without neural impingement.  L4-5: Mild disc bulge with disc desiccation. Small posterior annular fissure noted. Mild facet and ligamentum flavum hypertrophy. No significance canal stenosis. Mild bilateral L4 foraminal narrowing without impingement.  L5-S1: Normal disc. Mild facet hypertrophy. No significant stenosis.  IMPRESSION: MR THORACIC SPINE IMPRESSION  1. Mild degenerative disc bulging  at T9-10 and T11-12 without significant stenosis. 2. Otherwise unremarkable MRI of the thoracic spine. No other significant disc pathology. No canal or neural foraminal stenosis.  MR LUMBAR SPINE IMPRESSION  1. Degenerative disc bulge with facet hypertrophy at L3-4 with resultant mild canal and bilateral foraminal stenosis. 2. Degenerative disc bulge at L4-5 with resultant mild bilateral L4 foraminal narrowing. 3. Otherwise negative MRI of the lumbar spine. No other significant stenosis or evidence for neural impingement.   Electronically Signed   By: Jeannine Boga M.D.   On: 03/18/2017 15:33    Lumbar DG (Complete) 4+V:  Results for orders placed during the hospital encounter of 03/29/18  DG Lumbar Spine Complete   Narrative CLINICAL DATA:  Fall with right hip pain.  EXAM: LUMBAR SPINE - COMPLETE 4+ VIEW  COMPARISON:  03/26/2018 lumbar spine MRI  FINDINGS: This report assumes 5 non rib-bearing lumbar vertebrae.  Lumbar vertebral body heights are stable from recent MRI, with no fracture.  Mild multilevel lumbar degenerative disc disease, most prominent at L1-2 and L3-4. No spondylolisthesis. No significant facet arthropathy. No aggressive appearing focal osseous lesions. Abdominal aortic atherosclerosis. Partially visualized right total hip arthroplasty.  IMPRESSION: 1. No acute osseous abnormality. 2. Mild multilevel lumbar degenerative disc disease.   Electronically Signed   By: Ilona Sorrel M.D.   On: 03/29/2018 19:41    Hip Imaging: Hip-R MR wo contrast:  Results for orders placed during the hospital encounter of 01/01/17  MR HIP RIGHT WO CONTRAST   Narrative CLINICAL DATA:  Patient fell on a prolonged broke right hip. Right hip pain and groin pain with limited range of motion.  EXAM: MR OF THE RIGHT HIP WITHOUT CONTRAST  TECHNIQUE: Multiplanar, multisequence MR imaging was performed. No intravenous contrast was administered.  COMPARISON:   CT from 12/05/2016  FINDINGS: Bones: Susceptibility artifact from right femoral nail fixation across an intratrochanteric fracture of the right femur is identified. Detail of the fracture, better visualized on the recent CT is compromised as result. There appears to be marrow edema of the weight-bearing portion of the femoral head. Curvilinear hypointensity of the subcortical right femoral head raises the possibility of avascular necrosis, series 2, image 20. Sub chondral degenerative cyst of the acetabular roof is seen on the right.  Articular cartilage and labrum  Articular cartilage: Given joint space narrowing of both hips, and there is likely moderate thinning 10 of the acetabular and femoral head cartilage bilaterally.  Labrum: No definite labral tear though assessment is limited by lack of joint fluid.  Joint or bursal effusion  Joint effusion:  No joint effusion  Bursae:  No bursal fluid collections  Muscles and tendons  Muscles and tendons: Mild edema of the right gluteus muscles possibly representing stigmata of mild strain or tendinopathy.  Other findings  Miscellaneous: The included SI joints are nonacute. The pubic symphysis is maintained. No sacral insufficiency fracture is identified. The patient is status post hysterectomy. No adnexal mass is noted.  IMPRESSION: 1. Limited study due to pre-existing right femoral nail fixation causing susceptibility artifacts through a known intertrochanteric fracture of the right femur. 2. Despite susceptibility artifacts, there  does appear to be some evidence of marrow edema along the weight-bearing portion of the right femoral head. A curvilinear hypointensity seen along involving the subcortical bone and the possibility of AVN is not excluded. 3. Marrow edema adjacent to the greater trochanter likely representing mild gluteal muscle strain.   Electronically Signed   By: Ashley Royalty M.D.   On: 01/01/2017 23:11     Hip-R CT wo contrast:  Results for orders placed during the hospital encounter of 02/17/18  CT Hip Right Wo Contrast   Narrative CLINICAL DATA:  77 year old female with worsening right hip pain since hip replacement.  EXAM: CT OF THE RIGHT HIP WITHOUT CONTRAST  TECHNIQUE: Multidetector CT imaging of the right hip was performed according to the standard protocol. Multiplanar CT image reconstructions were also generated.  COMPARISON:  Right hip radiograph dated 02/09/2018 and CT of the abdomen pelvis dated 10/26/2017.  FINDINGS: Evaluation of this exam is limited in the absence of intravenous contrast. Evaluation is also limited due to streak artifact caused by metallic arthroplasty.  Bones/Joint/Cartilage  There is a total right hip arthroplasty which appears intact and in anatomic alignment. There is satisfactory contact between the arthroplasty and bone. There is no acute fracture or dislocation. There is soft tissue thickening around the neck of the arthroplasty which may represent effusion. Evaluation of this area is limited due to streak artifact caused by arthroplasty. Ultrasound may provide better evaluation.  Ligaments  Suboptimally assessed by CT.  Muscles and Tendons  No acute findings.  No intramuscular hematoma.  Soft tissues  Right hip subcutaneous scarring, postsurgical.  No fluid collection.  IMPRESSION: 1. No acute fracture or dislocation. 2. Right hip arthroplasty appears intact with no evidence of loosening. There may fluid within the joint adjacent to the neck of the arthroplasty. Ultrasound may provide better evaluation.   Electronically Signed   By: Anner Crete M.D.   On: 02/17/2018 06:11    Hip-R DG 2-3 views:  Results for orders placed during the hospital encounter of 03/29/18  DG Hip Unilat W or Wo Pelvis 2-3 Views Right   Narrative CLINICAL DATA:  Right hip pain. Fall a few days ago. Replacement 6 months ago.  EXAM: DG  HIP (WITH OR WITHOUT PELVIS) 2-3V RIGHT  COMPARISON:  February 09, 2018  FINDINGS: The patient is status post right hip replacement. Acetabular and femoral components are in good position. No dislocation. No fractures identified. No evidence of hardware failure.  IMPRESSION: Right hip replacement. No acute fracture noted. No evidence of hardware failure.   Electronically Signed   By: Dorise Bullion III M.D   On: 03/29/2018 18:27    Hip-L DG 2-3 views:  Results for orders placed during the hospital encounter of 03/26/16  DG Hip Unilat With Pelvis 2-3 Views Left   Narrative CLINICAL DATA:  Syncopal episode falling onto the left side with left-sided pain.  EXAM: DG HIP (WITH OR WITHOUT PELVIS) 2-3V LEFT  COMPARISON:  None.  FINDINGS: No pelvic fracture. No hip fracture. No significant degenerative change.  IMPRESSION: Negative radiographs.   Electronically Signed   By: Nelson Chimes M.D.   On: 03/26/2016 15:27    Knee Imaging: Knee-L MR w contrast:  Results for orders placed during the hospital encounter of 09/04/16  MR KNEE LEFT WO CONTRAST   Narrative CLINICAL DATA:  Feels like the knee is popping out.  Pain.  EXAM: MRI OF THE LEFT KNEE WITHOUT CONTRAST  TECHNIQUE: Multiplanar, multisequence MR imaging of  the knee was performed. No intravenous contrast was administered.  COMPARISON:  None.  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 No obvious periarticular fluid collection. No osteolysis. No acute fracture or dislocation. Overall alignment is anatomic. No Baker cyst.  Ligaments  Collateral ligaments are obscured by susceptibility artifact.  Muscles and Tendons No muscle signal abnormality. No muscle atrophy or edema. No intramuscular fluid collection or hematoma. Patellar tendon and quadriceps tendon are intact.  Soft  tissue No fluid collection or hematoma. No soft tissue mass. Popliteal fossa is normal. Normal neurovascular bundles.  IMPRESSION: 1. 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 No evidence of hardware failure or complication. 2. No Baker cyst.   Electronically Signed   By: Kathreen Devoid   On: 09/04/2016 13:51    Knee-R MR wo contrast:  Results for orders placed during the hospital encounter of 01/01/17  MR KNEE RIGHT WO CONTRAST   Narrative CLINICAL DATA:  Pain after fall in April. Right knee pain and swelling.  EXAM: MRI OF THE RIGHT KNEE WITHOUT CONTRAST  TECHNIQUE: Multiplanar, multisequence MR imaging of the knee was performed. No intravenous contrast was administered.  COMPARISON:  11/19/2016  FINDINGS: Markedly compromised study due to susceptibility artifacts from an indwelling long-stem semi constrained knee arthroplasty.  Imaging was performed in an attempt to identify any potentially occult periprosthetic fracture. No such fracture or edema is identified. No significant joint effusion. Generalized muscle atrophy is seen.  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 No occult fracture about the femoral or tibial stem are identified. There is generalized muscle atrophy about the knee. No significant fluid collections are apparent.   Electronically Signed   By: Ashley Royalty M.D.   On: 01/01/2017 23:14    Ankle Imaging: Ankle-L DG Complete:  Results for orders placed during the hospital encounter of 03/09/10  DG Ankle Complete Left   Narrative Clinical Data: [place] down stairs - pain and ankle   LEFT ANKLE COMPLETE - 3+ VIEW   Comparison: None.   Findings: There is a medial soft tissue swelling.  No fracture or dislocation.  Bones mildly demineralized.   IMPRESSION: Normal except for medial soft tissue swelling.  Provider: Elvera Lennox    Foot Imaging: Foot-L DG Complete:  Results for orders placed during the hospital encounter of 03/09/10  DG Foot Complete Left   Narrative Clinical Data: [place] down stairs - laceration in the region of the fifth metatarsal   LEFT FOOT - COMPLETE 3+ VIEW   Comparison: None.   Findings: No fracture or dislocation.  There is a skin defect over the base of the fifth metatarsal.  No foreign body in the soft tissues.  Small calcaneal spur.  Bones somewhat demineralized.   IMPRESSION: No acute findings.  There is a laceration near the base of the fifth metatarsal.  Provider: Elvera Lennox   Elbow Imaging: Elbow-L DG Complete:  Results for orders placed during the hospital encounter of 03/09/10  DG Elbow Complete Left   Narrative Clinical Data: [place] - posterior elbow pain   LEFT ELBOW - COMPLETE 3+ VIEW   Comparison: None.   Findings: The bones are mildly demineralized. No fracture or dislocation.  No specific arthropathy. No foreign body or other abnormality of the soft tissues.   IMPRESSION: No acute or significant findings.  Provider: Elvera Lennox   Wrist Imaging: Wrist-L DG Complete:  Results for orders placed during the hospital encounter of 03/12/15  DG Wrist Complete Left   Narrative CLINICAL DATA:  77 year old female with history of trauma from a fall yesterday evening complaining of pain in the left wrist.  EXAM: LEFT WRIST - COMPLETE 3+ VIEW  COMPARISON:  None.  FINDINGS: Multiple views of the left wrist demonstrate no acute displaced fracture, subluxation, dislocation, or soft tissue abnormality.  IMPRESSION: No acute radiographic abnormality of the left wrist.   Electronically Signed   By: Vinnie Langton M.D.   On: 03/12/2015 11:30    Complexity Note: Imaging results reviewed. Results shared with Ms. Soza, using Layman's terms.                         Meds   Current Outpatient Medications:  .  albuterol (ACCUNEB) 1.25 MG/3ML nebulizer  solution, Take 1 ampule by nebulization every 6 (six) hours as needed for wheezing., Disp: , Rfl:  .  albuterol (PROVENTIL HFA;VENTOLIN HFA) 108 (90 BASE) MCG/ACT inhaler, Inhale 2 puffs into the lungs every 6 (six) hours as needed for wheezing or shortness of breath. , Disp: , Rfl:  .  alendronate (FOSAMAX) 70 MG tablet, Take 70 mg by mouth every Monday. Take with a full glass of water on an empty stomach., Disp: , Rfl:  .  ALPRAZolam (XANAX) 1 MG tablet, Take 1 mg by mouth at bedtime as needed for sleep., Disp: , Rfl:  .  amLODipine (NORVASC) 5 MG tablet, Take 5 mg by mouth daily. , Disp: , Rfl:  .  Calcium-Phosphorus-Vitamin D (CALCIUM/D3 ADULT GUMMIES PO), Take 1 tablet by mouth daily., Disp: , Rfl:  .  citalopram (CELEXA) 20 MG tablet, Take 20 mg by mouth daily. , Disp: , Rfl:  .  docusate sodium (COLACE) 100 MG capsule, Take 200 mg by mouth 2 (two) times daily as needed for moderate constipation. , Disp: , Rfl:  .  esomeprazole (NEXIUM) 40 MG capsule, Take 40 mg by mouth daily. , Disp: , Rfl:  .  Ferrous Sulfate (IRON) 325 (65 Fe) MG TABS, Take 1 tablet (325 mg total) by mouth 2 (two) times daily. (Patient taking differently: Take 1 tablet by mouth 2 (two) times a week. ), Disp: 60 each, Rfl: 0 .  guaiFENesin (MUCINEX) 600 MG 12 hr tablet, Take 600 mg by mouth 2 (two) times daily as needed for cough., Disp: , Rfl:  .  lidocaine (LIDODERM) 5 %, Place 1 patch onto the skin every 12 (twelve) hours. Remove & Discard patch within 12 hours or as directed by MD, Disp: 10 patch, Rfl: 0 .  linaclotide (LINZESS) 290 MCG CAPS capsule, Take 290 mcg by mouth daily., Disp: , Rfl:  .  lovastatin (MEVACOR) 10 MG tablet, Take 10 mg by mouth daily., Disp: , Rfl:  .  meloxicam (MOBIC) 15 MG tablet, Take 15 mg by mouth daily. , Disp: , Rfl:  .  Menthol, Topical Analgesic, (BIOFREEZE EX), Apply 1 application topically daily as needed (muscle pain)., Disp: , Rfl:  .  Multiple Vitamins-Minerals (MULTIVITAMIN GUMMIES  ADULT PO), Take 1 tablet by mouth daily., Disp: , Rfl:  .  ondansetron (ZOFRAN ODT) 4 MG disintegrating tablet, Take 1 tablet (4 mg total) by mouth every 8 (eight) hours as needed for nausea or vomiting., Disp: 20 tablet, Rfl: 0 .  polyethylene glycol (MIRALAX / GLYCOLAX) packet, Take 17 g by mouth daily as needed (for constipation.). , Disp: , Rfl:  .  pregabalin (LYRICA) 75 MG capsule, Take 1 capsule (75 mg total) by mouth 2 (two) times daily., Disp: 60 capsule, Rfl: 5 .  traZODone (DESYREL) 100 MG tablet, Take 100 mg by mouth at bedtime. , Disp: , Rfl:   ROS  Constitutional: Denies any fever or chills Gastrointestinal: No reported hemesis, hematochezia, vomiting, or acute GI distress Musculoskeletal: Denies any acute onset joint swelling, redness, loss of ROM, or weakness Neurological: No reported episodes of acute onset apraxia, aphasia, dysarthria, agnosia, amnesia, paralysis, loss of coordination, or loss of consciousness  Allergies  Ms. Veloso is allergic to ampicillin; ampicillin-sulbactam sodium; ambien [zolpidem tartrate]; flexeril [cyclobenzaprine]; tape; toradol [ketorolac tromethamine]; sulfamethoxazole-trimethoprim; zolpidem; cephalexin; and tramadol.  [place]  Drug: Ms. Krasinski  reports that she does not use drugs. Alcohol:  reports that she does not drink alcohol. Tobacco:  reports that she quit smoking about 6 years ago. Her smoking use included e-cigarettes and cigarettes. She has a 45.00 pack-year smoking history. She has never used smokeless tobacco. Medical:  has a past medical history of Acute postoperative pain (02/03/2018), Anemia, B12 deficiency, Bacteremia (10/07/2014), Bladder incontinence, Blind left eye, Cancer ([place]), Cataract, Cervical spine fracture ([place]), Chest pain (07/29/2013), Compression fracture, COPD (chronic obstructive pulmonary disease) ([place]), DDD (degenerative disc disease), lumbar, Depression, Diffuse myofascial pain syndrome (03/24/2015), Dyspnea, Fever  (10/05/2014), GERD (gastroesophageal reflux disease), Hiatal hernia, Hyperlipidemia, Hypertension, Hypertensive kidney disease, malignant (11/07/2016), Macular degeneration, Osteoarthritis, Osteoporosis, Pneumonia, Reactive airway disease, Rupture of bowel ([place]), Sepsis ([place]) (10/08/2014), Stroke [facility]), Stroke ([place]), and Wrist pain, acute (09/10/2012). Surgical: Ms. Ozer  has a past surgical history that includes Abdominal surgery; Colon surgery; Appendectomy; Bladder surgery; Rectocele repair; Knee surgery; Cholecystectomy; Abdominal hysterectomy; Ostomy; Eye surgery; Intramedullary (im) nail intertrochanteric (Right, 09/13/2016); Hip surgery (Right, 08/2016); Joint replacement (Bilateral); Toe Surgery (Right); Colostomy reversal; and Total hip arthroplasty (Right, 09/30/2017). Family: family history includes Heart attack in her father; Heart failure in her mother.  Constitutional Exam  General appearance: Well nourished, well developed, and well hydrated. In no apparent acute distress Vitals:   04/06/18 1210  BP: (!) 93/55  Pulse: (!) 54  Resp: 18  Temp: 98.6 F (37 C)  TempSrc: Oral  SpO2: 99%  Weight: 180 lb (81.6 kg)  Height: _0  (1.651 m)   BMI Assessment: Estimated body mass index is 29.95 kg/m as calculated from the following:   Height as of this encounter: _1  (1.651 m).   Weight as of this encounter: 180 lb (81.6 kg).  BMI interpretation table: BMI level Category Range association with higher incidence of chronic pain  <18 kg/m2 Underweight   18.5-24.9 kg/m2 Ideal body weight   25-29.9 kg/m2 Overweight Increased incidence by 20%  30-34.9 kg/m2 Obese (Class I) Increased incidence by 68%  35-39.9 kg/m2 Severe obesity (Class II) Increased incidence by 136%  >40 kg/m2 Extreme obesity (Class III) Increased incidence by 254%   Patient's current BMI Ideal Body weight  Body mass index is 29.95 kg/m. Ideal body weight: 57 kg (125 lb 10.6 oz) Adjusted ideal body weight: 66.9 kg (147  lb 6.4 oz)   BMI Readings from Last 4 Encounters:  04/06/18 29.95 kg/m  03/29/18 29.12 kg/m  03/19/18 28.29 kg/m  03/17/18 29.12 kg/m   Wt Readings from Last 4 Encounters:  04/06/18 180 lb (81.6 kg)  03/29/18 175 lb (79.4 kg)  03/19/18 170 lb (77.1 kg)  03/17/18 175 lb (79.4 kg)  Psych/Mental status: Alert, oriented x 3 (person, place, & time)  Eyes: PERLA Respiratory: No evidence of acute respiratory distress  Cervical Spine Area Exam  Skin & Axial Inspection: No masses, redness, edema, swelling, or associated skin lesions Alignment: Symmetrical Functional ROM: Unrestricted ROM      Stability: No instability detected Muscle Tone/Strength: Functionally intact. No obvious neuro-muscular anomalies detected. Sensory (Neurological): Unimpaired Palpation: No palpable anomalies              Upper Extremity (UE) Exam    Side: Right upper extremity  Side: Left upper extremity  Skin & Extremity Inspection: Skin color, temperature, and hair growth are WNL. No peripheral edema or cyanosis. No masses, redness, swelling, asymmetry, or associated skin lesions. No contractures.  Skin & Extremity Inspection: Skin color, temperature, and hair growth are WNL. No peripheral edema or cyanosis. No masses, redness, swelling, asymmetry, or associated skin lesions. No contractures.  Functional ROM: Unrestricted ROM          Functional ROM: Unrestricted ROM          Muscle Tone/Strength: Functionally intact. No obvious neuro-muscular anomalies detected.  Muscle Tone/Strength: Functionally intact. No obvious neuro-muscular anomalies detected.  Sensory (Neurological): Unimpaired          Sensory (Neurological): Unimpaired          Palpation: No palpable anomalies              Palpation: No palpable anomalies              Provocative Test(s):  Phalen's test: deferred Tinel's test: deferred Apley's scratch test (touch opposite shoulder):  Action 1 (Across chest): deferred Action 2 (Overhead):  deferred Action 3 (LB reach): deferred   Provocative Test(s):  Phalen's test: deferred Tinel's test: deferred Apley's scratch test (touch opposite shoulder):  Action 1 (Across chest): deferred Action 2 (Overhead): deferred Action 3 (LB reach): deferred    Thoracic Spine Area Exam  Skin & Axial Inspection: No masses, redness, or swelling Alignment: Symmetrical Functional ROM: Unrestricted ROM Stability: No instability detected Muscle Tone/Strength: Functionally intact. No obvious neuro-muscular anomalies detected. Sensory (Neurological): Unimpaired Muscle strength & Tone: No palpable anomalies  Lumbar Spine Area Exam  Skin & Axial Inspection: No masses, redness, or swelling Alignment: Symmetrical Functional ROM: Diminished ROM       Stability: No instability detected Muscle Tone/Strength: Functionally intact. No obvious neuro-muscular anomalies detected. Sensory (Neurological): Unimpaired Palpation: No palpable anomalies       Provocative Tests: Hyperextension/rotation test: deferred today       Lumbar quadrant test (Kemp's test): deferred today       Lateral bending test: deferred today       Patrick's Maneuver: deferred today                   FABER test: deferred today                   S-I anterior distraction/compression test: deferred today         S-I lateral compression test: deferred today         S-I Thigh-thrust test: deferred today         S-I Gaenslen's test: deferred today          Gait & Posture Assessment  Ambulation: Patient ambulates using a wheel chair.  According to the daughter, at home she uses a walker. Gait: Significantly limited. Dependent on assistive device to ambulate Posture: Antalgic   Lower Extremity Exam    Side: Right lower extremity  Side: Left lower extremity  Stability: No instability observed          Stability: No instability observed          Skin & Extremity Inspection: Skin color, temperature, and hair growth are WNL. No  peripheral edema or cyanosis. No masses, redness, swelling, asymmetry, or associated skin lesions. No contractures.  Skin & Extremity Inspection: Skin color, temperature, and hair growth are WNL. No peripheral edema or cyanosis. No masses, redness, swelling, asymmetry, or associated skin lesions. No contractures.  Functional ROM: Decreased ROM for hip and knee joints          Functional ROM: Decreased ROM for hip and knee joints          Muscle Tone/Strength: Functionally intact. No obvious neuro-muscular anomalies detected.  Muscle Tone/Strength: Functionally intact. No obvious neuro-muscular anomalies detected.  Sensory (Neurological): Unimpaired  Sensory (Neurological): Unimpaired  Palpation: No palpable anomalies  Palpation: No palpable anomalies   Assessment  Primary Diagnosis & Pertinent Problem List: The primary encounter diagnosis was Chronic knee pain (Primary Area of Pain) (Right). Diagnoses of Chronic neck pain (Secondary Area of Pain) (Bilateral) (L>R), Chronic low back pain (Tertiary Area of Pain) (Bilateral) (L>R), Chronic hip pain (Right), Chronic knee pain (Bilateral) (R>L), At high risk for falls, High risk medication use, DDD (degenerative disc disease), lumbar, Chronic myofascial pain, and Neurogenic pain were also pertinent to this visit.  Status Diagnosis  Persistent Controlled Controlled 1. Chronic knee pain (Primary Area of Pain) (Right)   2. Chronic neck pain (Secondary Area of Pain) (Bilateral) (L>R)   3. Chronic low back pain [facility] Area of Pain) (Bilateral) (L>R)   4. Chronic hip pain (Right)   5. Chronic knee pain (Bilateral) (R>L)   6. At high risk for falls   7. High risk medication use   8. DDD (degenerative disc disease), lumbar   9. Chronic myofascial pain   10. Neurogenic pain     Problems updated and reviewed during this visit: No problems updated. Plan of Care  Pharmacotherapy (Medications Ordered): Meds ordered this encounter  Medications  .  pregabalin (LYRICA) 75 MG capsule    Sig: Take 1 capsule (75 mg total) by mouth 2 (two) times daily.    Dispense:  60 capsule    Refill:  5    Do not place this medication, or any other prescription from our practice, on "Automatic Refill". Patient may have prescription filled one day early if pharmacy is closed on scheduled refill date.   Medications administered today: Margrit L. Holck had no medications administered during this visit.   Procedure Orders     Lumbar Transforaminal Epidural     Lumbar Epidural Injection Lab Orders  No laboratory test(s) ordered today   Imaging Orders  No imaging studies ordered today   Referral Orders  No referral(s) requested today   Interventional management options: Planned, scheduled, and/or pending:   The patient indicates that Dr. Percell Miller has suggested that she should have some lumbar epidural steroid injections to see if this pain that she is experiencing in her right lower extremity secondary to some spinal stenosis that apparently he observed on the recent MRIs that he took I's office.  The patient has indicated that she would prefer to have those injections done here and we have agreed to do so.  Because I currently do not have the results of that MRI, we will be requesting them and upon arrival, I will review them and see what the best possible treatment for this  patient will be.  Once completed, I will send copies to Dr. Percell Miller for his records.   Considering:   Diagnostic right-sided genicular nerve block#2 Possible right-sided genicular nerve RFA Diagnostic right Femoral + Obturator nerve block #1 Possible right Femoral + Obturator nerve RFA Diagnostic left cervical epidural steroid injection Diagnostic bilateral cervical facet blocks Diagnostic bilateral intra-articular shoulder joint injection Diagnostic bilateral suprascapular nerve block Possible bilateral suprascapular nerve RFA Diagnosticright-sidedlumbar facet block  #3 Possibleright-sidedlumbar facet RFA Diagnostic right-sided sacroiliac joint block #3 Possible right-sided sacroiliac joint RFA   Palliative PRN treatment(s):   None at this time   Provider-requested follow-up: Return for Procedure (w/ sedation): (R) L3-4 & L4-5 LESI.  Future Appointments  Date Time Provider [place]  04/07/2018  9:45 AM Milinda Pointer, MD [facility] None  04/29/2018  1:30 PM Penumalli, Earlean Polka, MD [facility] None   Primary Care Physician: Alvester Chou, NP Location: [facility] Note by: Gaspar Cola, MD Date: 04/06/2018; Time: 5:08 PM

## 2018-04-06 ENCOUNTER — Other Ambulatory Visit: Payer: Self-pay

## 2018-04-06 ENCOUNTER — Ambulatory Visit: Payer: Medicare Other | Attending: Pain Medicine | Admitting: Pain Medicine

## 2018-04-06 ENCOUNTER — Ambulatory Visit
Admission: RE | Admit: 2018-04-06 | Discharge: 2018-04-06 | Disposition: A | Discharge status: Home / Self Care | Payer: Medicare Other | Source: Ambulatory Visit | Attending: Orthopedic Surgery | Admitting: Orthopedic Surgery

## 2018-04-06 ENCOUNTER — Encounter: Payer: Self-pay | Admitting: Pain Medicine

## 2018-04-06 VITALS — BP 93/55 | HR 54 | Temp 98.6°F | Resp 18 | Ht 65.0 in | Wt 180.0 lb

## 2018-04-06 DIAGNOSIS — M25562 Pain in left knee: Secondary | ICD-10-CM | POA: Insufficient documentation

## 2018-04-06 DIAGNOSIS — M545 Low back pain, unspecified: Secondary | ICD-10-CM

## 2018-04-06 DIAGNOSIS — I129 Hypertensive chronic kidney disease with stage 1 through stage 4 chronic kidney disease, or unspecified chronic kidney disease: Secondary | ICD-10-CM | POA: Insufficient documentation

## 2018-04-06 DIAGNOSIS — Z9181 History of falling: Secondary | ICD-10-CM | POA: Diagnosis not present

## 2018-04-06 DIAGNOSIS — Z87891 Personal history of nicotine dependence: Secondary | ICD-10-CM | POA: Insufficient documentation

## 2018-04-06 DIAGNOSIS — Z79891 Long term (current) use of opiate analgesic: Secondary | ICD-10-CM | POA: Diagnosis not present

## 2018-04-06 DIAGNOSIS — M47816 Spondylosis without myelopathy or radiculopathy, lumbar region: Secondary | ICD-10-CM | POA: Insufficient documentation

## 2018-04-06 DIAGNOSIS — M5136 Other intervertebral disc degeneration, lumbar region: Secondary | ICD-10-CM | POA: Diagnosis not present

## 2018-04-06 DIAGNOSIS — M25561 Pain in right knee: Secondary | ICD-10-CM | POA: Insufficient documentation

## 2018-04-06 DIAGNOSIS — M542 Cervicalgia: Secondary | ICD-10-CM | POA: Insufficient documentation

## 2018-04-06 DIAGNOSIS — M792 Neuralgia and neuritis, unspecified: Secondary | ICD-10-CM | POA: Diagnosis not present

## 2018-04-06 DIAGNOSIS — F325 Major depressive disorder, single episode, in full remission: Secondary | ICD-10-CM | POA: Insufficient documentation

## 2018-04-06 DIAGNOSIS — N183 Chronic kidney disease, stage 3 (moderate): Secondary | ICD-10-CM | POA: Insufficient documentation

## 2018-04-06 DIAGNOSIS — G8929 Other chronic pain: Secondary | ICD-10-CM

## 2018-04-06 DIAGNOSIS — M25551 Pain in right hip: Secondary | ICD-10-CM | POA: Diagnosis not present

## 2018-04-06 DIAGNOSIS — E7800 Pure hypercholesterolemia, unspecified: Secondary | ICD-10-CM | POA: Diagnosis not present

## 2018-04-06 DIAGNOSIS — M47817 Spondylosis without myelopathy or radiculopathy, lumbosacral region: Secondary | ICD-10-CM | POA: Insufficient documentation

## 2018-04-06 DIAGNOSIS — R42 Dizziness and giddiness: Secondary | ICD-10-CM | POA: Diagnosis not present

## 2018-04-06 DIAGNOSIS — M7918 Myalgia, other site: Secondary | ICD-10-CM | POA: Insufficient documentation

## 2018-04-06 DIAGNOSIS — M1611 Unilateral primary osteoarthritis, right hip: Secondary | ICD-10-CM | POA: Insufficient documentation

## 2018-04-06 DIAGNOSIS — R1310 Dysphagia, unspecified: Secondary | ICD-10-CM | POA: Insufficient documentation

## 2018-04-06 DIAGNOSIS — G894 Chronic pain syndrome: Secondary | ICD-10-CM | POA: Diagnosis not present

## 2018-04-06 DIAGNOSIS — M533 Sacrococcygeal disorders, not elsewhere classified: Secondary | ICD-10-CM | POA: Insufficient documentation

## 2018-04-06 DIAGNOSIS — Z79899 Other long term (current) drug therapy: Secondary | ICD-10-CM | POA: Insufficient documentation

## 2018-04-06 DIAGNOSIS — R413 Other amnesia: Secondary | ICD-10-CM | POA: Insufficient documentation

## 2018-04-06 DIAGNOSIS — K219 Gastro-esophageal reflux disease without esophagitis: Secondary | ICD-10-CM | POA: Diagnosis not present

## 2018-04-06 DIAGNOSIS — M80051D Age-related osteoporosis with current pathological fracture, right femur, subsequent encounter for fracture with routine healing: Secondary | ICD-10-CM | POA: Insufficient documentation

## 2018-04-06 DIAGNOSIS — M5388 Other specified dorsopathies, sacral and sacrococcygeal region: Secondary | ICD-10-CM | POA: Insufficient documentation

## 2018-04-06 MED ORDER — PREGABALIN 75 MG PO CAPS: 75.0000 mg | ORAL_CAPSULE | Freq: Two times a day (BID) | ORAL | 5 refills | Status: DC

## 2018-04-06 NOTE — Patient Instructions (Addendum)
____________________________________________________________________________________________  Muscle Spasms & Cramps  Cause:  The most common cause of muscle spasms and cramps is vitamin and/or electrolyte (calcium, potassium, sodium, etc.) deficiencies.  Possible triggers: Sweating - causes loss of electrolytes thru the skin. Steroids - causes loss of electrolytes thru the urine.  Treatment: 1. Gatorade (or any other electrolyte-replenishing drink) - Take 1, 8 oz glass with each meal (3 times a day). 2. OTC (over-the-counter) Magnesium 400 to 500 mg - Take 1 tablet twice a day (one with breakfast and one before bedtime). If you have kidney problems, talk to your primary care physician before taking any Magnesium. 3. Tonic Water with quinine - Take 1, 8 oz glass before bedtime.   ____________________________________________________________________________________________   Pain Management Discharge Instructions  General Discharge Instructions :  If you need to reach your doctor call: Monday-Friday 8:00 am - 4:00 pm at [phone] or toll free [phone].  After clinic hours [phone] to have operator reach doctor.  Bring all of your medication bottles to all your appointments in the pain clinic.  To cancel or reschedule your appointment with Pain Management please remember to call 24 hours in advance to avoid a fee.  Refer to the educational materials which you have been given on: General Risks, I had my Procedure. Discharge Instructions, Post Sedation.  Post Procedure Instructions:  The drugs you were given will stay in your system until tomorrow, so for the next 24 hours you should not drive, make any legal decisions or drink any alcoholic beverages.  You may eat anything you prefer, but it is better to start with liquids then soups and crackers, and gradually work up to solid foods.  Please notify your doctor immediately if you have any unusual bleeding, trouble  breathing or pain that is not related to your normal pain.  Depending on the type of procedure that was done, some parts of your body may feel week and/or numb.  This usually clears up by tonight or the next day.  Walk with the use of an assistive device or accompanied by an adult for the 24 hours.  You may use ice on the affected area for the first 24 hours.  Put ice in a Ziploc bag and cover with a towel and place against area 15 minutes on 15 minutes off.  You may switch to heat after 24 hours.GENERAL RISKS AND COMPLICATIONS  What are the risk, side effects and possible complications? Generally speaking, most procedures are safe.  However, with any procedure there are risks, side effects, and the possibility of complications.  The risks and complications are dependent upon the sites that are lesioned, or the type of nerve block to be performed.  The closer the procedure is to the spine, the more serious the risks are.  Great care is taken when placing the radio frequency needles, block needles or lesioning probes, but sometimes complications can occur. 1. Infection: Any time there is an injection through the skin, there is a risk of infection.  This is why sterile conditions are used for these blocks.  There are four possible types of infection. 1. Localized skin infection. 2. Central Nervous System Infection-This can be in the form of Meningitis, which can be deadly. 3. Epidural Infections-This can be in the form of an epidural abscess, which can cause pressure inside of the spine, causing compression of the spinal cord with subsequent paralysis. This would require an emergency surgery to decompress, and there are no guarantees that the patient would recover  from the paralysis. 4. Discitis-This is an infection of the intervertebral discs.  It occurs in about 1% of discography procedures.  It is difficult to treat and it may lead to surgery.        2. Pain: the needles have to go through skin and  soft tissues, will cause soreness.       3. Damage to internal structures:  The nerves to be lesioned may be near blood vessels or    other nerves which can be potentially damaged.       4. Bleeding: Bleeding is more common if the patient is taking blood thinners such as  aspirin, Coumadin, Ticiid, Plavix, etc., or if he/she have some genetic predisposition  such as hemophilia. Bleeding into the spinal canal can cause compression of the spinal  cord with subsequent paralysis.  This would require an emergency surgery to  decompress and there are no guarantees that the patient would recover from the  paralysis.       5. Pneumothorax:  Puncturing of a lung is a possibility, every time a needle is introduced in  the area of the chest or upper back.  Pneumothorax refers to free air around the  collapsed lung(s), inside of the thoracic cavity (chest cavity).  Another two possible  complications related to a similar event would include: Hemothorax and Chylothorax.   These are variations of the Pneumothorax, where instead of air around the collapsed  lung(s), you may have blood or chyle, respectively.       6. Spinal headaches: They may occur with any procedures in the area of the spine.       7. Persistent CSF (Cerebro-Spinal Fluid) leakage: This is a rare problem, but may occur  with prolonged intrathecal or epidural catheters either due to the formation of a fistulous  track or a dural tear.       8. Nerve damage: By working so close to the spinal cord, there is always a possibility of  nerve damage, which could be as serious as a permanent spinal cord injury with  paralysis.       9. Death:  Although rare, severe deadly allergic reactions known as "Anaphylactic  reaction" can occur to any of the medications used.      10. Worsening of the symptoms:  We can always make thing worse.  What are the chances of something like this happening? Chances of any of this occuring are extremely low.  By statistics, you  have more of a chance of getting killed in a motor vehicle accident: while driving to the hospital than any of the above occurring .  Nevertheless, you should be aware that they are possibilities.  In general, it is similar to taking a shower.  Everybody knows that you can slip, hit your head and get killed.  Does that mean that you should not shower again?  Nevertheless always keep in mind that statistics do not mean anything if you happen to be on the wrong side of them.  Even if a procedure has a 1 (one) in a 1,000,000 (million) chance of going wrong, it you happen to be that one..Also, keep in mind that by statistics, you have more of a chance of having something go wrong when taking medications.  Who should not have this procedure? If you are on a blood thinning medication (e.g. Coumadin, Plavix, see list of "Blood Thinners"), or if you have an active infection going on, you should not have the  procedure.  If you are taking any blood thinners, please inform your physician.  How should I prepare for this procedure?  Do not eat or drink anything at least six hours prior to the procedure.  Bring a driver with you .  It cannot be a taxi.  Come accompanied by an adult that can drive you back, and that is strong enough to help you if your legs get weak or numb from the local anesthetic.  Take all of your medicines the morning of the procedure with just enough water to swallow them.  If you have diabetes, make sure that you are scheduled to have your procedure done first thing in the morning, whenever possible.  If you have diabetes, take only half of your insulin dose and notify our nurse that you have done so as soon as you arrive at the clinic.  If you are diabetic, but only take blood sugar pills (oral hypoglycemic), then do not take them on the morning of your procedure.  You may take them after you have had the procedure.  Do not take aspirin or any aspirin-containing medications, at least  eleven (11) days prior to the procedure.  They may prolong bleeding.  Wear loose fitting clothing that may be easy to take off and that you would not mind if it got stained with Betadine or blood.  Do not wear any jewelry or perfume  Remove any nail coloring.  It will interfere with some of our monitoring equipment.  NOTE: Remember that this is not meant to be interpreted as a complete list of all possible complications.  Unforeseen problems may occur.  BLOOD THINNERS The following drugs contain aspirin or other products, which can cause increased bleeding during surgery and should not be taken for 2 weeks prior to and 1 week after surgery.  If you should need take something for relief of minor pain, you may take acetaminophen which is found in Tylenol,m Datril, Anacin-3 and Panadol. It is not blood thinner. The products listed below are.  Do not take any of the products listed below in addition to any listed on your instruction sheet.  A.P.C or A.P.C with Codeine Codeine Phosphate Capsules #3 Ibuprofen Ridaura  ABC compound Congesprin Imuran rimadil  Advil Cope Indocin Robaxisal  Alka-Seltzer Effervescent Pain Reliever and Antacid Coricidin or Coricidin-D  Indomethacin Rufen  Alka-Seltzer plus Cold Medicine Cosprin Ketoprofen S-A-C Tablets  Anacin Analgesic Tablets or Capsules Coumadin Korlgesic Salflex  Anacin Extra Strength Analgesic tablets or capsules CP-2 Tablets Lanoril Salicylate  Anaprox Cuprimine Capsules Levenox Salocol  Anexsia-D Dalteparin Magan Salsalate  Anodynos Darvon compound Magnesium Salicylate Sine-off  Ansaid Dasin Capsules Magsal Sodium Salicylate  Anturane Depen Capsules Marnal Soma  APF Arthritis pain formula Dewitt's Pills Measurin Stanback  Argesic Dia-Gesic Meclofenamic Sulfinpyrazone  Arthritis Bayer Timed Release Aspirin Diclofenac Meclomen Sulindac  Arthritis pain formula Anacin Dicumarol Medipren Supac  Analgesic (Safety coated) Arthralgen Diffunasal  Mefanamic Suprofen  Arthritis Strength Bufferin Dihydrocodeine Mepro Compound Suprol  Arthropan liquid Dopirydamole Methcarbomol with Aspirin Synalgos  ASA tablets/Enseals Disalcid Micrainin Tagament  Ascriptin Doan's Midol Talwin  Ascriptin A/D Dolene Mobidin Tanderil  Ascriptin Extra Strength Dolobid Moblgesic Ticlid  Ascriptin with Codeine Doloprin or Doloprin with Codeine Momentum Tolectin  Asperbuf Duoprin Mono-gesic Trendar  Aspergum Duradyne Motrin or Motrin IB Triminicin  Aspirin plain, buffered or enteric coated Durasal Myochrisine Trigesic  Aspirin Suppositories Easprin Nalfon Trillsate  Aspirin with Codeine Ecotrin Regular or Extra Strength Naprosyn Uracel  Atromid-S Efficin Naproxen  Ursinus  Auranofin Capsules Elmiron Neocylate Vanquish  Axotal Emagrin Norgesic Verin  Azathioprine Empirin or Empirin with Codeine Normiflo Vitamin E  Azolid Emprazil Nuprin Voltaren  Bayer Aspirin plain, buffered or children's or timed BC Tablets or powders Encaprin Orgaran Warfarin Sodium  Buff-a-Comp Enoxaparin Orudis Zorpin  Buff-a-Comp with Codeine Equegesic Os-Cal-Gesic   Buffaprin Excedrin plain, buffered or Extra Strength Oxalid   Bufferin Arthritis Strength Feldene Oxphenbutazone   Bufferin plain or Extra Strength Feldene Capsules Oxycodone with Aspirin   Bufferin with Codeine Fenoprofen Fenoprofen Pabalate or Pabalate-SF   Buffets II Flogesic Panagesic   Buffinol plain or Extra Strength Florinal or Florinal with Codeine Panwarfarin   Buf-Tabs Flurbiprofen Penicillamine   Butalbital Compound Four-way cold tablets Penicillin   Butazolidin Fragmin Pepto-Bismol   Carbenicillin Geminisyn Percodan   Carna Arthritis Reliever Geopen Persantine   Carprofen Gold's salt Persistin   Chloramphenicol Goody's Phenylbutazone   Chloromycetin Haltrain Piroxlcam   Clmetidine heparin Plaquenil   Cllnoril Hyco-pap Ponstel   Clofibrate Hydroxy chloroquine Propoxyphen         Before stopping any of  these medications, be sure to consult the physician who ordered them.  Some, such as Coumadin (Warfarin) are ordered to prevent or treat serious conditions such as "deep thrombosis", "pumonary embolisms", and other heart problems.  The amount of time that you may need off of the medication may also vary with the medication and the reason for which you were taking it.  If you are taking any of these medications, please make sure you notify your pain physician before you undergo any procedures.         Selective Nerve Root Block Patient Information  Description: Specific nerve roots exit the spinal canal and these nerves can be compressed and inflamed by a bulging disc and bone spurs.  By injecting steroids on the nerve root, we can potentially decrease the inflammation surrounding these nerves, which often leads to decreased pain.  Also, by injecting local anesthesia on the nerve root, this can provide [place] helpful information to give to your referring doctor if it decreases your pain.  Selective nerve root blocks can be done along the spine from the neck to the low back depending on the location of your pain.   After numbing the skin with local anesthesia, a small needle is passed to the nerve root and the position of the needle is verified using x-ray pictures.  After the needle is in correct position, we then deposit the medication.  You may experience a pressure sensation while this is being done.  The entire block usually lasts less than 15 minutes.  Conditions that may be treated with selective nerve root blocks:  Low back and leg pain  Spinal stenosis  Diagnostic block prior to potential surgery  Neck and arm pain  Post laminectomy syndrome  Preparation for the injection:  1. Do not eat any solid food or dairy products within 8 hours of your appointment. 2. You may drink clear liquids up to 3 hours before an appointment.  Clear liquids include water, black coffee, juice or soda.  No  milk or cream please. 3. You may take your regular medications, including pain medications, with a sip of water before your appointment.  Diabetics should hold regular insulin (if taken separately) and take 1/2 normal NPH dose the morning of the procedure.  Carry some sugar containing items with you to your appointment. 4. A driver must accompany you and be prepared to drive you home  after your procedure. 5. Bring all your current medications with you. 6. An IV may be inserted and sedation may be given at the discretion of the physician. 7. A blood pressure cuff, EKG, and other monitors will often be applied during the procedure.  Some patients may need to have extra oxygen administered for a short period. 8. You will be asked to provide medical information, including allergies, prior to the procedure.  We must know immediately if you are taking blood  Thinners (like Coumadin) or if you are allergic to IV iodine contrast (dye).  Possible side-effects: All are usually temporary  Bleeding from needle site  Light headedness  Numbness and tingling  Decreased blood pressure  Weakness in arms/legs  Pressure sensation in back/neck  Pain at injection site (several days)  Possible complications: All are extremely rare  Infection  Nerve injury  Spinal headache (a headache wore with upright position)  Call if you experience:  Fever/chills associated with headache or increased back/neck pain  Headache worsened by an upright position  New onset weakness or numbness of an extremity below the injection site  Hives or difficulty breathing (go to the emergency room)  Inflammation or drainage at the injection site(s)  Severe back/neck pain greater than usual  New symptoms which are concerning to you  Please note:  Although the local anesthetic injected can often make your back or neck feel good for several hours after the injection the pain will likely return.  It takes 3-5 days  for steroids to work on the nerve root. You may not notice any pain relief for at least one week.  If effective, we will often do a series of 3 injections spaced 3-6 weeks apart to maximally decrease your pain.    If you have any questions, please call [phone] [facility] Patient Information  Description: The epidural space surrounds the nerves as they exit the spinal cord.  In some patients, the nerves can be compressed and inflamed by a bulging disc or a tight spinal canal (spinal stenosis).  By injecting steroids into the epidural space, we can bring irritated nerves into direct contact with a potentially helpful medication.  These steroids act directly on the irritated nerves and can reduce swelling and inflammation which often leads to decreased pain.  Epidural steroids may be injected anywhere along the spine and from the neck to the low back depending upon the location of your pain.   After numbing the skin with local anesthetic (like Novocaine), a small needle is passed into the epidural space slowly.  You may experience a sensation of pressure while this is being done.  The entire block usually last less than 10 minutes.  Conditions which may be treated by epidural steroids:   Low back and leg pain  Neck and arm pain  Spinal stenosis  Post-laminectomy syndrome  Herpes zoster (shingles) pain  Pain from compression fractures  Preparation for the injection:  9. Do not eat any solid food or dairy products within 8 hours of your appointment.  10. You may drink clear liquids up to 3 hours before appointment.  Clear liquids include water, black coffee, juice or soda.  No milk or cream please. 11. You may take your regular medication, including pain medications, with a sip of water before your appointment  Diabetics should hold regular insulin (if taken separately) and take 1/2 normal NPH dos the morning of the procedure.   Carry some sugar containing  items with you to your appointment. 12. A driver must accompany you and be prepared to drive you home after your procedure.  31. Bring all your current medications with your. 14. An IV may be inserted and sedation may be given at the discretion of the physician.   15. A blood pressure cuff, EKG and other monitors will often be applied during the procedure.  Some patients may need to have extra oxygen administered for a short period. 37. You will be asked to provide medical information, including your allergies, prior to the procedure.  We must know immediately if you are taking blood thinners (like Coumadin/Warfarin)  Or if you are allergic to IV iodine contrast (dye). We must know if you could possible be pregnant.  Possible side-effects:  Bleeding from needle site  Infection (rare, may require surgery)  Nerve injury (rare)  Numbness & tingling (temporary)  Difficulty urinating (rare, temporary)  Spinal headache ( a headache worse with upright posture)  Light -headedness (temporary)  Pain at injection site (several days)  Decreased blood pressure (temporary)  Weakness in arm/leg (temporary)  Pressure sensation in back/neck (temporary)  Call if you experience:  Fever/chills associated with headache or increased back/neck pain.  Headache worsened by an upright position.  New onset weakness or numbness of an extremity below the injection site  Hives or difficulty breathing (go to the emergency room)  Inflammation or drainage at the infection site  Severe back/neck pain  Any new symptoms which are concerning to you  Please note:  Although the local anesthetic injected can often make your back or neck feel good for several hours after the injection, the pain will likely return.  It takes 3-7 days for steroids to work in the epidural space.  You may not notice any pain relief for at least that one week.  If effective, we will often do a  series of three injections spaced 3-6 weeks apart to maximally decrease your pain.  After the initial series, we generally will wait several months before considering a repeat injection of the same type.  If you have any questions, please call [phone] [facility]

## 2018-04-07 ENCOUNTER — Ambulatory Visit
Admission: RE | Admit: 2018-04-07 | Discharge: 2018-04-07 | Disposition: A | Discharge status: Home / Self Care | Payer: Medicare Other | Source: Ambulatory Visit | Attending: Pain Medicine | Admitting: Pain Medicine

## 2018-04-07 ENCOUNTER — Ambulatory Visit (HOSPITAL_BASED_OUTPATIENT_CLINIC_OR_DEPARTMENT_OTHER): Payer: Medicare Other | Admitting: Pain Medicine

## 2018-04-07 ENCOUNTER — Other Ambulatory Visit: Payer: Self-pay

## 2018-04-07 ENCOUNTER — Encounter: Payer: Self-pay | Admitting: Pain Medicine

## 2018-04-07 VITALS — BP 161/90 | HR 56 | Temp 98.2°F | Resp 19 | Ht 65.0 in | Wt 180.0 lb

## 2018-04-07 DIAGNOSIS — M48061 Spinal stenosis, lumbar region without neurogenic claudication: Secondary | ICD-10-CM | POA: Diagnosis not present

## 2018-04-07 DIAGNOSIS — M47816 Spondylosis without myelopathy or radiculopathy, lumbar region: Secondary | ICD-10-CM | POA: Insufficient documentation

## 2018-04-07 DIAGNOSIS — Z9049 Acquired absence of other specified parts of digestive tract: Secondary | ICD-10-CM | POA: Diagnosis not present

## 2018-04-07 DIAGNOSIS — M5136 Other intervertebral disc degeneration, lumbar region: Secondary | ICD-10-CM | POA: Insufficient documentation

## 2018-04-07 DIAGNOSIS — M79604 Pain in right leg: Secondary | ICD-10-CM

## 2018-04-07 DIAGNOSIS — Z96641 Presence of right artificial hip joint: Secondary | ICD-10-CM | POA: Diagnosis not present

## 2018-04-07 DIAGNOSIS — Z90710 Acquired absence of both cervix and uterus: Secondary | ICD-10-CM | POA: Diagnosis not present

## 2018-04-07 DIAGNOSIS — Z79899 Other long term (current) drug therapy: Secondary | ICD-10-CM | POA: Insufficient documentation

## 2018-04-07 DIAGNOSIS — R937 Abnormal findings on diagnostic imaging of other parts of musculoskeletal system: Secondary | ICD-10-CM | POA: Insufficient documentation

## 2018-04-07 DIAGNOSIS — G8929 Other chronic pain: Secondary | ICD-10-CM | POA: Insufficient documentation

## 2018-04-07 MED ORDER — ROPIVACAINE HCL 2 MG/ML IJ SOLN
1.0000 mL | Freq: Once | INTRAMUSCULAR | Status: AC
  Administered 2018-04-07: 1 mL via EPIDURAL
  Filled 2018-04-07: qty 10

## 2018-04-07 MED ORDER — LACTATED RINGERS IV SOLN
1000.0000 mL | Freq: Once | INTRAVENOUS | Status: AC
  Administered 2018-04-07: 1000 mL via INTRAVENOUS

## 2018-04-07 MED ORDER — LIDOCAINE HCL 2 % IJ SOLN
20.0000 mL | Freq: Once | INTRAMUSCULAR | Status: AC
  Administered 2018-04-07: 400 mg
  Filled 2018-04-07: qty 40

## 2018-04-07 MED ORDER — FENTANYL CITRATE (PF) 100 MCG/2ML IJ SOLN
25.0000 ug | INTRAMUSCULAR | Status: DC | PRN
  Administered 2018-04-07: 50 ug via INTRAVENOUS
  Filled 2018-04-07: qty 2

## 2018-04-07 MED ORDER — SODIUM CHLORIDE 0.9% FLUSH
2.0000 mL | Freq: Once | INTRAVENOUS | Status: AC
  Administered 2018-04-07: 2 mL

## 2018-04-07 MED ORDER — IOPAMIDOL (ISOVUE-M 200) INJECTION 41%
10.0000 mL | Freq: Once | INTRAMUSCULAR | Status: AC
  Administered 2018-04-07: 10 mL via EPIDURAL

## 2018-04-07 MED ORDER — MIDAZOLAM HCL 5 MG/5ML IJ SOLN
1.0000 mg | INTRAMUSCULAR | Status: DC | PRN
  Administered 2018-04-07: 2 mg via INTRAVENOUS
  Filled 2018-04-07: qty 5

## 2018-04-07 MED ORDER — SODIUM CHLORIDE 0.9% FLUSH
1.0000 mL | Freq: Once | INTRAVENOUS | Status: AC
  Administered 2018-04-07: 1 mL

## 2018-04-07 MED ORDER — ROPIVACAINE HCL 2 MG/ML IJ SOLN
2.0000 mL | Freq: Once | INTRAMUSCULAR | Status: AC
  Administered 2018-04-07: 2 mL via EPIDURAL
  Filled 2018-04-07: qty 10

## 2018-04-07 MED ORDER — DEXAMETHASONE SODIUM PHOSPHATE 10 MG/ML IJ SOLN
10.0000 mg | Freq: Once | INTRAMUSCULAR | Status: AC
  Administered 2018-04-07: 10 mg
  Filled 2018-04-07: qty 1

## 2018-04-07 MED ORDER — TRIAMCINOLONE ACETONIDE 40 MG/ML IJ SUSP
40.0000 mg | Freq: Once | INTRAMUSCULAR | Status: AC
  Administered 2018-04-07: 40 mg
  Filled 2018-04-07: qty 1

## 2018-04-07 NOTE — Progress Notes (Signed)
Patient's Name: [name]  MRN: [id number]  Referring Provider: Alvester Chou, NP  DOB: 06-02-1940  PCP: Alvester Chou, NP  DOS: 04/07/2018  Note by: Gaspar Cola, MD  Service setting: Ambulatory outpatient  Specialty: Interventional Pain Management  Patient type: Established  Location: [facility] (AMB) Pain Management Facility  Visit type: Interventional Procedure   Primary Reason for Visit: Interventional Pain Management Treatment. CC: Procedure  Procedure #1:  Anesthesia, Analgesia, Anxiolysis:  Type: Diagnostic Trans-Foraminal Epidural Steroid Injection   #1  Region: Lumbar Level: L4 Paravertebral Laterality: Right-Sided Paravertebral   Type: Moderate (Conscious) Sedation combined with Local Anesthesia Indication(s): Analgesia and Anxiety Route: Intravenous (IV) IV Access: Secured Sedation: Meaningful verbal contact was maintained at all times during the procedure  Local Anesthetic: Lidocaine 1-2%  Position: Prone  Procedure #2:    Type: Diagnostic Inter-Laminar Epidural Steroid Injection   #1  Region: Lumbar Level: L3-4 Level. Laterality: Right-Sided Paramedial     Indications: 1. DDD (degenerative disc disease), lumbar   2. Chronic lower extremity pain (Right)   3. 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 Abnormal MRI, lumbar spine    Pain Score: Pre-procedure: 6 /10 Post-procedure: 8 /10  Pre-op Assessment:  [name] is a 77 y.o. (year old), female patient, seen today for interventional treatment. 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  [name] has a current medication list which includes the following prescription(s): albuterol, albuterol, alendronate, alprazolam, amlodipine, calcium-phosphorus-vitamin d, citalopram, docusate sodium, esomeprazole, iron, guaifenesin, lidocaine, linaclotide, lovastatin, meloxicam, menthol (topical analgesic), multiple vitamins-minerals, ondansetron, polyethylene glycol, pregabalin, and trazodone, and the following Facility-Administered Medications: fentanyl and midazolam. Her primarily concern today is the Procedure  Initial Vital Signs:  Pulse/HCG Rate: (!) 56ECG Heart Rate: 64 Temp: 98.6 F (37 C) Resp: 18 BP: (!) 167/92 SpO2: 99 %  BMI: Estimated body mass index is 29.95 kg/m as calculated from the following:   Height as of this encounter: 5\' 5"  (1.651 m).   Weight as of this encounter: 180 lb (81.6 kg).  Risk Assessment: Allergies: Reviewed. She is allergic to ampicillin; ampicillin-sulbactam sodium; ambien [zolpidem tartrate]; flexeril [cyclobenzaprine]; tape; toradol [ketorolac tromethamine]; sulfamethoxazole-trimethoprim; zolpidem; cephalexin; and tramadol.  Allergy Precautions: None required Coagulopathies: Reviewed. None identified.  Blood-thinner therapy: None at this time Active Infection(s): Reviewed. None identified. [name] is afebrile  Site Confirmation: [name] was asked to confirm the procedure and laterality before marking the site Procedure checklist: Completed Consent: Before the procedure and under the influence of no sedative(s), amnesic(s), or anxiolytics, the patient was informed of the treatment options, risks and possible complications. To fulfill our ethical and legal obligations, as recommended by the American Medical Association's Code of Ethics, I have informed the patient of my clinical impression; the nature and purpose of the treatment or procedure; the risks, benefits, and possible complications of the intervention; the alternatives, including doing  nothing; the risk(s) and benefit(s) of the alternative treatment(s) or procedure(s); and the risk(s) and benefit(s) of doing nothing. The patient was provided information about the general risks and possible complications associated with the procedure. These may include, but are not limited to: failure to achieve desired goals, infection, bleeding, organ or  nerve damage, allergic reactions, paralysis, and death. In addition, the patient was informed of those risks and complications associated to Spine-related procedures, such as failure to decrease pain; infection (i.e.: Meningitis, epidural or intraspinal abscess); bleeding (i.e.: epidural hematoma, subarachnoid hemorrhage, or any other type of intraspinal or peri-dural bleeding); organ or nerve damage (i.e.: Any type of peripheral nerve, nerve root, or spinal cord injury) with subsequent damage to sensory, motor, and/or autonomic systems, resulting in permanent pain, numbness, and/or weakness of one or several areas of the body; allergic reactions; (i.e.: anaphylactic reaction); and/or death. Furthermore, the patient was informed of those risks and complications associated with the medications. These include, but are not limited to: allergic reactions (i.e.: anaphylactic or anaphylactoid reaction(s)); adrenal axis suppression; blood sugar elevation that in diabetics may result in ketoacidosis or comma; water retention that in patients with history of congestive heart failure may result in shortness of breath, pulmonary edema, and decompensation with resultant heart failure; weight gain; swelling or edema; medication-induced neural toxicity; particulate matter embolism and blood vessel occlusion with resultant organ, and/or nervous system infarction; and/or aseptic necrosis of one or more joints. Finally, the patient was informed that Medicine is not an exact science; therefore, there is also the possibility of unforeseen or unpredictable risks and/or possible  complications that may result in a catastrophic outcome. The patient indicated having understood very clearly. We have given the patient no guarantees and we have made no promises. Enough time was given to the patient to ask questions, all of which were answered to the patient's satisfaction. Ms. Galvis has indicated that she wanted to continue with the procedure. Attestation: I, the ordering provider, attest that I have discussed with the patient the benefits, risks, side-effects, alternatives, likelihood of achieving goals, and potential problems during recovery for the procedure that I have provided informed consent. Date  Time: 04/07/2018 10:08 AM  Pre-Procedure Preparation:  Monitoring: As per clinic protocol. Respiration, ETCO2, SpO2, BP, heart rate and rhythm monitor placed and checked for adequate function Safety Precautions: Patient was assessed for positional comfort and pressure points before starting the procedure. Time-out: I initiated and conducted the "Time-out" before starting the procedure, as per protocol. The patient was asked to participate by confirming the accuracy of the "Time Out" information. Verification of the correct person, site, and procedure were performed and confirmed by me, the nursing staff, and the patient. "Time-out" conducted as per Joint Commission's Universal Protocol (UP.01.01.01). Time: 1052  Description of Procedure #1:  Target Area: The inferior and lateral portion of the pedicle, just lateral to a line created by the 6:00 position of the pedicle and the superior articular process of the vertebral body below. On the lateral view, this target lies just posterior to the anterior aspect of the lamina and posterior to the midpoint created between the anterior and the posterior aspect of the neural foramina. Approach: Posterior paravertebral approach. Area Prepped: Entire Posterior Lumbosacral Region Prepping solution: ChloraPrep (2% chlorhexidine gluconate and  70% isopropyl alcohol) Safety Precautions: Aspiration looking for blood return was conducted prior to all injections. At no point did we inject any substances, as a needle was being advanced. No attempts were made at seeking any paresthesias. Safe injection practices and needle disposal techniques used. Medications properly checked for expiration dates. SDV (single dose vial) medications used.  Description of the Procedure: Protocol guidelines were followed. The patient was placed in position over the fluoroscopy table. The target area was identified and the area prepped in the usual  manner. Skin & deeper tissues infiltrated with local anesthetic. Appropriate amount of time allowed to pass for local anesthetics to take effect. The procedure needles were then advanced to the target area. Proper needle placement secured. Negative aspiration confirmed. Solution injected in intermittent fashion, asking for systemic symptoms every 0.2cc of injectate. The needles were then removed and the area cleansed, making sure to leave some of the prepping solution back to take advantage of its long term bactericidal properties.  Start Time: 1052 hrs.  Materials:  Needle(s) Type: Spinal Needle Gauge: 22G Length: 3.5-in Medication(s): Please see orders for medications and dosing details.  Description of Procedure #2:  Target Area: The  interlaminar space, initially targeting the lower border of the superior vertebral body lamina. Approach: Posterior paramedial approach. Area Prepped: Same as above Prepping solution: Same as above Safety Precautions: Same as above  Description of the Procedure: Protocol guidelines were followed. The patient was placed in position over the fluoroscopy table. The target area was identified and the area prepped in the usual manner. Skin & deeper tissues infiltrated with local anesthetic. Appropriate amount of time allowed to pass for local anesthetics to take effect. The procedure  needle was introduced through the skin, ipsilateral to the reported pain, and advanced to the target area. Bone was contacted and the needle walked caudad, until the lamina was cleared. The ligamentum flavum was engaged and loss-of-resistance technique used as the epidural needle was advanced. The epidural space was identified using "loss-of-resistance technique" with 2-3 ml of PF-NaCl (0.9% NSS), in a 5cc LOR glass syringe. Proper needle placement secured. Negative aspiration confirmed. Solution injected in intermittent fashion, asking for systemic symptoms every 0.5cc of injectate. The needles were then removed and the area cleansed, making sure to leave some of the prepping solution back to take advantage of its long term bactericidal properties.  Vitals:   04/07/18 1110 04/07/18 1119 04/07/18 1123 04/07/18 1130  BP: (!) 199/93 (!) 228/107 (!) 173/98 (!) 161/90  Pulse:      Resp:      Temp: 98.3 F (36.8 C)   98.2 F (36.8 C)  TempSrc: Temporal   Temporal  SpO2: 95% 99%  98%  Weight:      Height:        End Time: 1103 hrs.  Materials:  Needle(s) Type: Epidural needle Gauge: 17G Length: 3.5-in Medication(s): Please see orders for medications and dosing details.  Imaging Guidance (Spinal):          Type of Imaging Technique: Fluoroscopy Guidance (Spinal) Indication(s): Assistance in needle guidance and placement for procedures requiring needle placement in or near specific anatomical locations not easily accessible without such assistance. Exposure Time: Please see nurses notes. Contrast: Before injecting any contrast, we confirmed that the patient did not have an allergy to iodine, shellfish, or radiological contrast. Once satisfactory needle placement was completed at the desired level, radiological contrast was injected. Contrast injected under live fluoroscopy. No contrast complications. See chart for type and volume of contrast used. Fluoroscopic Guidance: I was personally present  during the use of fluoroscopy. "Tunnel Vision Technique" used to obtain the best possible view of the target area. Parallax error corrected before commencing the procedure. "Direction-depth-direction" technique used to introduce the needle under continuous pulsed fluoroscopy. Once target was reached, antero-posterior, oblique, and lateral fluoroscopic projection used confirm needle placement in all planes. Images permanently stored in EMR. Interpretation: I personally interpreted the imaging intraoperatively. Adequate needle placement confirmed in multiple planes. Appropriate spread of contrast  into desired area was observed. No evidence of afferent or efferent intravascular uptake. No intrathecal or subarachnoid spread observed. Permanent images saved into the patient's record.  Antibiotic Prophylaxis:   Anti-infectives (From admission, onward)   None     Indication(s): None identified  Post-operative Assessment:  Post-procedure Vital Signs:  Pulse/HCG Rate: (!) 5663 Temp: 98.2 F (36.8 C) Resp: 19 BP: (!) 161/90 SpO2: 98 %  EBL: None  Complications: No immediate post-treatment complications observed by team, or reported by patient.  Note: The patient tolerated the entire procedure well. A repeat set of vitals were taken after the procedure and the patient was kept under observation following institutional policy, for this type of procedure. Post-procedural neurological assessment was performed, showing return to baseline, prior to discharge. The patient was provided with post-procedure discharge instructions, including a section on how to identify potential problems. Should any problems arise concerning this procedure, the patient was given instructions to immediately contact us, at any time, without hesitation. In any case, we plan to contact the patient by telephone for a follow-up status report regarding this interventional procedure.  Comments:  This patient appears to have absolutely  no pain tolerance.  She also is quick to voice any discomfort in an exaggerated manner.  Today while performing her epidural steroid injection she complained of the local anesthetic infiltration of the skin and the muscle indicating that the pain caused by that was just as bad as what she normally experiences.  To make sure that she understood what she was saying I repeated to her: "You mean to say that your usual pain feel like when you have the numbing medicine injected?" She responded "yes" to this.  Plan of Care   Imaging Orders     DG C-Arm 1-60 Min-No Report  Procedure Orders     Lumbar Epidural Injection     Lumbar Transforaminal Epidural  Medications ordered for procedure: Meds ordered this encounter  Medications  . lidocaine (XYLOCAINE) 2 % (with pres) injection 400 mg  . midazolam (VERSED) 5 MG/5ML injection 1-2 mg    Make sure Flumazenil is available in the pyxis when using this medication. If oversedation occurs, administer 0.2 mg IV over 15 sec. If after 45 sec no response, administer 0.2 mg again over 1 min; may repeat at 1 min intervals; not to exceed 4 doses (1 mg)  . fentaNYL (SUBLIMAZE) injection 25-50 mcg    Make sure Narcan is available in the pyxis when using this medication. In the event of respiratory depression (RR< 8/min): Titrate NARCAN (naloxone) in increments of 0.1 to 0.2 mg IV at 2-3 minute intervals, until desired degree of reversal.  . lactated ringers infusion 1,000 mL  . sodium chloride flush (NS) 0.9 % injection 1 mL  . ropivacaine (PF) 2 mg/mL (0.2%) (NAROPIN) injection 1 mL  . dexamethasone (DECADRON) injection 10 mg  . sodium chloride flush (NS) 0.9 % injection 2 mL  . ropivacaine (PF) 2 mg/mL (0.2%) (NAROPIN) injection 2 mL  . triamcinolone acetonide (KENALOG-40) injection 40 mg  . iopamidol (ISOVUE-M) 41 % intrathecal injection 10 mL   Medications administered: We administered lidocaine, midazolam, fentaNYL, lactated ringers, sodium chloride  flush, ropivacaine (PF) 2 mg/mL (0.2%), dexamethasone, sodium chloride flush, ropivacaine (PF) 2 mg/mL (0.2%), triamcinolone acetonide, and iopamidol.  See the medical record for exact dosing, route, and time of administration.  Disposition: Discharge home  Discharge Date & Time: 04/07/2018; 1133 hrs.   Physician-requested Follow-up: Return for post-procedure eval (2 wks),  w/ Dr. Dossie Arbour.  Future Appointments  Date Time Provider Nellie  04/27/2018  1:15 PM Milinda Pointer, MD [facility] None  04/29/2018  1:30 PM Penumalli, Earlean Polka, MD [facility] None   Primary Care Physician: Alvester Chou, NP Location: [facility] Note by: Gaspar Cola, MD Date: 04/07/2018; Time: 12:22 PM  Disclaimer:  Medicine is not an Chief Strategy Officer. The only guarantee in medicine is that nothing is guaranteed. It is important to note that the decision to proceed with this intervention was based on the information collected from the patient. The Data and conclusions were drawn from the patient's questionnaire, the interview, and the physical examination. Because the information was provided in large part by the patient, it cannot be guaranteed that it has not been purposely or unconsciously manipulated. Every effort has been made to obtain as much relevant data as possible for this evaluation. It is important to note that the conclusions that lead to this procedure are derived in large part from the available data. Always take into account that the treatment will also be dependent on availability of resources and existing treatment guidelines, considered by other Pain Management Practitioners as being common knowledge and practice, at the time of the intervention. For Medico-Legal purposes, it is also important to point out that variation in procedural techniques and pharmacological choices are the acceptable norm. The indications, contraindications, technique, and results of the above  procedure should only be interpreted and judged by a Board-Certified Interventional Pain Specialist with extensive familiarity and expertise in the same exact procedure and technique.

## 2018-04-07 NOTE — Progress Notes (Signed)
Safety precautions to be maintained throughout the outpatient stay will include: orient to surroundings, keep bed in low position, maintain call bell within reach at all times, provide assistance with transfer out of bed and ambulation.  

## 2018-04-07 NOTE — Patient Instructions (Signed)

## 2018-04-09 ENCOUNTER — Telehealth: Payer: Self-pay | Admitting: Pain Medicine

## 2018-04-09 NOTE — Telephone Encounter (Signed)
NO PHONE medications will be called in. Patient may go to ED if needed.

## 2018-04-09 NOTE — Telephone Encounter (Signed)
Per Vmail left by [name] calling for mother [name], patient is in extreme pain and needs something for her pain. Procedure has not helped, was not numb even the day of the procedure. Please call to discuss what can be done.

## 2018-04-10 ENCOUNTER — Emergency Department (HOSPITAL_COMMUNITY)
Admission: EM | Admit: 2018-04-10 | Discharge: 2018-04-10 | Disposition: A | Discharge status: Home / Self Care | Payer: Medicare Other | Attending: Emergency Medicine | Admitting: Emergency Medicine

## 2018-04-10 ENCOUNTER — Encounter (HOSPITAL_COMMUNITY): Payer: Self-pay

## 2018-04-10 ENCOUNTER — Other Ambulatory Visit: Payer: Self-pay

## 2018-04-10 DIAGNOSIS — J449 Chronic obstructive pulmonary disease, unspecified: Secondary | ICD-10-CM | POA: Diagnosis not present

## 2018-04-10 DIAGNOSIS — Z79899 Other long term (current) drug therapy: Secondary | ICD-10-CM | POA: Insufficient documentation

## 2018-04-10 DIAGNOSIS — I129 Hypertensive chronic kidney disease with stage 1 through stage 4 chronic kidney disease, or unspecified chronic kidney disease: Secondary | ICD-10-CM | POA: Diagnosis not present

## 2018-04-10 DIAGNOSIS — Z96641 Presence of right artificial hip joint: Secondary | ICD-10-CM | POA: Insufficient documentation

## 2018-04-10 DIAGNOSIS — N183 Chronic kidney disease, stage 3 (moderate): Secondary | ICD-10-CM | POA: Insufficient documentation

## 2018-04-10 DIAGNOSIS — G8929 Other chronic pain: Secondary | ICD-10-CM

## 2018-04-10 DIAGNOSIS — Z87891 Personal history of nicotine dependence: Secondary | ICD-10-CM | POA: Diagnosis not present

## 2018-04-10 DIAGNOSIS — M25551 Pain in right hip: Secondary | ICD-10-CM | POA: Insufficient documentation

## 2018-04-10 MED ORDER — HYDROMORPHONE HCL 1 MG/ML IJ SOLN
1.0000 mg | Freq: Once | INTRAMUSCULAR | Status: AC
  Administered 2018-04-10: 1 mg via INTRAMUSCULAR
  Filled 2018-04-10: qty 1

## 2018-04-10 NOTE — ED Notes (Signed)
Patient verbalizes understanding of discharge instructions. Opportunity for questioning and answers were provided. 

## 2018-04-10 NOTE — ED Provider Notes (Signed)
[facility] EMERGENCY DEPARTMENT Provider Note   CSN: [id number] Arrival date & time: 04/10/18  1223     History   Chief Complaint Chief Complaint  Patient presents with  . Leg Pain  . Hip Pain    HPI [name] is a 77 y.o. female.  The history is provided by the patient and medical records. No language interpreter was used.  Leg Pain   Pertinent negatives include no numbness.  Hip Pain      77 year old female with history of chronic back pain on chronic opiate, degenerative disc disease, brought here via EMS from home for evaluation of hip and leg pain.  Patient states she has had pain chronically involving her right hip down to her right leg ongoing for more than 6 months after the fall.  She also report being seen evaluate multiple times for this pain without adequate management.  States that she had hip replacement done by Dr. Percell Miller from the fall and she felt the bone never fully adherent to the hardware therefore she still had persistent pain.  Pain is sharp throbbing achy persistent nothing seems to make it better or worse.  Nothing change recently but states that she has been crying every day due to her pain and her daughter recommend her to come to the ER for pain control.  Patient denies any fever or chills, bowel bladder incontinence or saddle anesthesia, new focal numbness or weakness.  She does admits to going to a pain clinic specialist but states "they do nothing for me".  Past Medical History:  Diagnosis Date  . Acute postoperative pain 02/03/2018  . Anemia   . B12 deficiency   . Bacteremia 10/07/2014  . Bladder incontinence   . Blind left eye   . Cancer ([place])    skin cancer   . Cataract   . Cervical spine fracture ([place])   . Chest pain 07/29/2013  . Compression fracture    C7,  upper T spine -compression fx  . COPD (chronic obstructive pulmonary disease) ([place])   . DDD (degenerative disc disease), lumbar   . Depression    major  .  Diffuse myofascial pain syndrome 03/24/2015  . Dyspnea    at times- when activty  . Fever 10/05/2014  . GERD (gastroesophageal reflux disease)   . Hiatal hernia   . Hyperlipidemia   . Hypertension   . 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 Macular degeneration   . Osteoarthritis   . Osteoporosis   . Pneumonia    years ago  . Reactive airway disease   . Rupture of bowel ([place])   . Sepsis ([place]) 10/08/2014  . Stroke [facility])    "mini stroke"- balance and memory issue  . Stroke ([place])   . Wrist pain, acute 09/10/2012    Patient Active Problem List   Diagnosis Date Noted  . Chronic lower extremity pain (Right) 04/07/2018  . Abnormal MRI, lumbar spine 04/07/2018  . Lumbar facet hypertrophy (Multilevel) (Bilateral) 04/07/2018  . Lumbar foraminal stenosis (L3-4, L4-5) (Bilateral) 04/07/2018  . Annular tear of lumbar disc (L3-4, L4-5) 04/07/2018  . Acute right hip pain 02/26/2018  . Closed hip fracture, sequela (Right) 02/03/2018  . It band syndrome, right 12/18/2017  . History of hip replacement (Right) 12/18/2017  . Ileus ([place]) 10/27/2017  . Primary osteoarthritis of hip 09/30/2017  . History of small bowel obstruction 09/10/2017  . 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 SBO (small  bowel obstruction) ([place]) 08/24/2017  . Abnormal x-ray of pelvis 08/13/2017  . Other specified dorsopathies, sacral and sacrococcygeal region 08/04/2017  . Spondylosis without myelopathy or radiculopathy, lumbosacral region 07/17/2017  . Non-traumatic compression fracture of T3 thoracic vertebra, sequela 07/02/2017  . High risk medication use 07/02/2017  . At high risk for falls 07/02/2017  . Chronic sacroiliac joint pain (Right) 07/02/2017  . CKD (chronic kidney disease) stage 3, GFR 30-59 ml/min (HCC) 04/23/2017  . Chronic knee pain s/p total knee replacement (TKR) (Right) 04/02/2017  . DDD (degenerative disc disease), lumbar 03/05/2017  . Chronic knee pain (Bilateral)  (R>L) 03/05/2017  . Osteoarthritis 03/05/2017  . Chronic musculoskeletal pain 03/05/2017  . Neurogenic pain 03/05/2017  . Chronic myofascial pain 02/12/2017  . Lumbar facet syndrome (Bilateral) (L>R) 02/12/2017  . Long term prescription benzodiazepine use 02/12/2017  . Opiate use 02/12/2017  . Disorder of skeletal system 02/12/2017  . Pharmacologic therapy 02/12/2017  . Problems influencing health status 02/12/2017  . 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 NSAID long-term use 02/12/2017  . DDD (degenerative disc disease), cervical 11/11/2016  . Lumbar facet arthropathy (Bilateral) 11/11/2016  . 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 CAFL (chronic airflow limitation) ([place]) 11/07/2016  . Depression, major, in partial remission ([place]) 11/07/2016  . 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 Bed wetting 11/07/2016  . Long term current use of opiate analgesic 11/07/2016  . Long term prescription opiate use 11/07/2016  . Chronic pain syndrome 11/07/2016  . Chronic neck pain (Secondary Area of Pain) (Bilateral) (L>R) 11/07/2016  . Chronic hip pain (Right) 11/07/2016  . Rib pain 11/07/2016  . Age-related osteoporosis with current pathological fracture with routine healing 09/20/2016  . History of stroke 09/20/2016  . Major depression in remission ([place]) 09/20/2016  . Intertrochanteric fracture of femur, sequela (Right) 09/13/2016  . Fall   . Chronic shoulder pain (Bilateral) 08/12/2016  . Hx of total knee replacement (Bilateral) 04/04/2016  . Chronic shoulder pain (Left) 02/28/2016  . Hoarseness 01/12/2016  . Pharyngoesophageal dysphagia 01/12/2016  . Chronic low back pain [facility]) (Bilateral) (L>R) 10/26/2015  . 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 UTI (urinary tract infection) 06/25/2015  . Syncope, non cardiac 05/22/2015  . Leukopenia  10/07/2014  . Thrombocytopenia ([place]) 10/07/2014  . Hypoxia 10/05/2014  . Anxiety 06/15/2014  . Apoplectic vertigo 06/15/2014  . Duodenogastric reflux 06/15/2014  . Benign essential HTN 06/15/2014  . Infection of the upper respiratory tract 06/15/2014  . Hypercholesteremia 07/30/2013  . HTN (hypertension) 07/30/2013  . GERD (gastroesophageal reflux disease) 07/30/2013  . Dizziness and giddiness 06/18/2013  . Memory loss 05/03/2013  . Chronic knee pain (Primary Area of Pain) (Right) 09/10/2012    Past Surgical History:  Procedure Laterality Date  . ABDOMINAL HYSTERECTOMY    . ABDOMINAL SURGERY    . APPENDECTOMY    . BLADDER SURGERY    . CHOLECYSTECTOMY    . COLON SURGERY    . COLOSTOMY REVERSAL    . EYE SURGERY     cataracts  . HIP SURGERY Right 08/2016  . INTRAMEDULLARY (IM) NAIL INTERTROCHANTERIC Right 09/13/2016   Procedure: INTRAMEDULLARY (IM) NAIL INTERTROCHANTRIC RIGHT;  Surgeon: Leandrew Koyanagi, MD;  Location: [facility];  Service: Orthopedics;  Laterality: Right;  . JOINT REPLACEMENT Bilateral   . KNEE SURGERY    . OSTOMY    . RECTOCELE REPAIR    .  TOE SURGERY Right    3rd  . TOTAL HIP ARTHROPLASTY Right 09/30/2017   Procedure: RIGHT HIP IM NAIL REMOVAL WITH CONVERSION TO TOTAL HIP ARTHOPLASTY, anterior approach;  Surgeon: Renette Butters, MD;  Location: [facility];  Service: Orthopedics;  Laterality: Right;     OB History    Gravida  0   Para  0   Term  0   Preterm  0   AB  0   Living        SAB  0   TAB  0   Ectopic  0   Multiple      Live Births               Home Medications    Prior to Admission medications   Medication Sig Start Date End Date Taking? Authorizing Provider  albuterol (ACCUNEB) 1.25 MG/3ML nebulizer solution Take 1 ampule by nebulization every 6 (six) hours as needed for wheezing.    [provider]  albuterol (PROVENTIL HFA;VENTOLIN HFA) 108 (90 BASE) MCG/ACT inhaler Inhale 2 puffs into the lungs every 6 (six) hours as  needed for wheezing or shortness of breath.     [provider]  alendronate (FOSAMAX) 70 MG tablet Take 70 mg by mouth every Monday. Take with a full glass of water on an empty stomach.    [provider]  ALPRAZolam Duanne Moron) 1 MG tablet Take 1 mg by mouth at bedtime as needed for sleep.    [provider]  amLODipine (NORVASC) 5 MG tablet Take 5 mg by mouth daily.  04/06/13   [provider]  Calcium-Phosphorus-Vitamin D (CALCIUM/D3 ADULT GUMMIES PO) Take 1 tablet by mouth daily.    [provider]  citalopram (CELEXA) 20 MG tablet Take 20 mg by mouth daily.  02/27/17   [provider]  docusate sodium (COLACE) 100 MG capsule Take 200 mg by mouth 2 (two) times daily as needed for moderate constipation.     [provider]  esomeprazole (NEXIUM) 40 MG capsule Take 40 mg by mouth daily.     [provider]  Ferrous Sulfate (IRON) 325 (65 Fe) MG TABS Take 1 tablet (325 mg total) by mouth 2 (two) times daily. Patient taking differently: Take 1 tablet by mouth 2 (two) times a week.  10/01/17   Martensen, Charna Elizabeth III, PA-C  guaiFENesin (MUCINEX) 600 MG 12 hr tablet Take 600 mg by mouth 2 (two) times daily as needed for cough.    [provider]  lidocaine (LIDODERM) 5 % Place 1 patch onto the skin every 12 (twelve) hours. Remove & Discard patch within 12 hours or as directed by MD 03/17/18 03/17/19  Sable Feil, PA-C  linaclotide [facility]) 290 MCG CAPS capsule Take 290 mcg by mouth daily.    [provider]  lovastatin (MEVACOR) 10 MG tablet Take 10 mg by mouth daily. 09/22/14   [provider]  meloxicam (MOBIC) 15 MG tablet Take 15 mg by mouth daily.  05/23/16   [provider]  Menthol, Topical Analgesic, (BIOFREEZE EX) Apply 1 application topically daily as needed (muscle pain).    [provider]  Multiple Vitamins-Minerals (MULTIVITAMIN GUMMIES ADULT PO) Take 1 tablet by mouth  daily.    [provider]  ondansetron (ZOFRAN ODT) 4 MG disintegrating tablet Take 1 tablet (4 mg total) by mouth every 8 (eight) hours as needed for nausea or vomiting. 02/26/18   Eula Listen, MD  polyethylene glycol (  MIRALAX / GLYCOLAX) packet Take 17 g by mouth daily as needed (for constipation.).     [provider]  pregabalin (LYRICA) 75 MG capsule Take 1 capsule (75 mg total) by mouth 2 (two) times daily. 04/06/18 10/03/18  Milinda Pointer, MD  traZODone (DESYREL) 100 MG tablet Take 100 mg by mouth at bedtime.     [provider]    Family History Family History  Problem Relation Age of Onset  . Heart failure Mother   . Heart attack Father     Social History Social History   Tobacco Use  . Smoking status: Former Smoker    Packs/day: 1.50    Years: 30.00    Pack years: 45.00    Types: E-cigarettes, Cigarettes    Last attempt to quit: 2013    Years since quitting: 6.8  . Smokeless tobacco: Never Used  Substance Use Topics  . Alcohol use: No    Alcohol/week: 0.0 standard drinks  . Drug use: No     Allergies   Ampicillin; Ampicillin-sulbactam sodium; Ambien [zolpidem tartrate]; Flexeril [cyclobenzaprine]; Tape; Toradol [ketorolac tromethamine]; Sulfamethoxazole-trimethoprim; Zolpidem; Cephalexin; and Tramadol   Review of Systems Review of Systems  Constitutional: Negative for fever.  Musculoskeletal: Positive for arthralgias.  Skin: Negative for rash and wound.  Neurological: Negative for numbness.     Physical Exam Updated Vital Signs There were no vitals taken for this visit.  Physical Exam  Constitutional: She appears well-developed and well-nourished. No distress.  Patient is resting comfortably in bed, on moment patient is smiling and conversant, next moment patient is crying and requesting for pain medication.  HENT:  Head: Atraumatic.  Eyes: Conjunctivae are normal.  Neck: Neck supple.  Cardiovascular: Normal  rate and regular rhythm.  Pulmonary/Chest: Effort normal and breath sounds normal.  Abdominal: Soft. There is no tenderness.  Musculoskeletal: She exhibits tenderness (Right hip: Tenderness about the hip on palpation diffusely with normal range of motion, normal surgical scar no deformity.  Tenderness along right thigh and knee, nonfocal.).  Neurological: She is alert.  5 out of 5 strength to bilateral lower extremity with intact distal pedal pulses.  Skin: No rash noted.  Psychiatric: She has a normal mood and affect.  Nursing note and vitals reviewed.    ED Treatments / Results  Labs (all labs ordered are listed, but only abnormal results are displayed) Labs Reviewed - No data to display  EKG None  Radiology No results found.  Procedures Procedures (including critical care time)  Medications Ordered in ED Medications - No data to display   Initial Impression / Assessment and Plan / ED Course  I have reviewed the triage vital signs and the nursing notes.  Pertinent labs & imaging results that were available during my care of the patient were reviewed by me and considered in my medical decision making (see chart for details).     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  She has had prior surgery to her  right hip and right knee.  She is here for acute on chronic pain.  She does not have any finding to suggest infectious etiology.  She is neurovascular intact without red flags.  She is currently in the care of pain management.  She has been seen multiple times for similar complaints of joint pain.  At this  time, patient will receive pain medication in the ED only encourage patient to follow-up with her pain clinic specialist for further care of her condition.  I will also give her referral to Dr. Percell Miller who previously performed her right hip replacement.  I have very low suspicion for hardware malfunction at this time as patient is able to range her hips and denies any recent injury. Care discussed with dr. Johnney Killian.    Pt given one mg of dilaudid IM with improvement of sxs.  Will d/c.    Domenic Moras, PA-C 04/10/18 1351    Charlesetta Shanks, MD 04/11/18 [phone]

## 2018-04-10 NOTE — Discharge Instructions (Addendum)
Please follow up with your orthopedist as well as your pain management specialist for further management of your chronic hip and leg pain.

## 2018-04-10 NOTE — ED Triage Notes (Signed)
Pt brought in by [facility] from home for right leg and hip pain. Pt states she had a right hip replacement in may, pt states "I've been crying since". Pt states only relief found when she is laying down. Pt intermittently tearful during triage. Pt easily distracted.

## 2018-04-13 ENCOUNTER — Emergency Department
Admission: EM | Admit: 2018-04-13 | Discharge: 2018-04-13 | Disposition: A | Discharge status: Home / Self Care | Payer: Medicare Other | Attending: Emergency Medicine | Admitting: Emergency Medicine

## 2018-04-13 ENCOUNTER — Encounter: Payer: Self-pay | Admitting: Emergency Medicine

## 2018-04-13 ENCOUNTER — Other Ambulatory Visit: Payer: Self-pay

## 2018-04-13 ENCOUNTER — Telehealth: Payer: Self-pay | Admitting: Pain Medicine

## 2018-04-13 DIAGNOSIS — Z79899 Other long term (current) drug therapy: Secondary | ICD-10-CM | POA: Diagnosis not present

## 2018-04-13 DIAGNOSIS — M25551 Pain in right hip: Secondary | ICD-10-CM | POA: Insufficient documentation

## 2018-04-13 DIAGNOSIS — I129 Hypertensive chronic kidney disease with stage 1 through stage 4 chronic kidney disease, or unspecified chronic kidney disease: Secondary | ICD-10-CM | POA: Insufficient documentation

## 2018-04-13 DIAGNOSIS — Z8673 Personal history of transient ischemic attack (TIA), and cerebral infarction without residual deficits: Secondary | ICD-10-CM | POA: Insufficient documentation

## 2018-04-13 DIAGNOSIS — E785 Hyperlipidemia, unspecified: Secondary | ICD-10-CM | POA: Insufficient documentation

## 2018-04-13 DIAGNOSIS — Z87891 Personal history of nicotine dependence: Secondary | ICD-10-CM | POA: Insufficient documentation

## 2018-04-13 DIAGNOSIS — J449 Chronic obstructive pulmonary disease, unspecified: Secondary | ICD-10-CM | POA: Insufficient documentation

## 2018-04-13 DIAGNOSIS — N183 Chronic kidney disease, stage 3 (moderate): Secondary | ICD-10-CM | POA: Diagnosis not present

## 2018-04-13 MED ORDER — DICLOFENAC SODIUM 1 % TD GEL: 2.0000 g | Freq: Four times a day (QID) | TRANSDERMAL | 0 refills | Status: DC

## 2018-04-13 MED ORDER — OXYCODONE HCL 5 MG PO TABS
5.0000 mg | ORAL_TABLET | Freq: Once | ORAL | Status: AC
  Administered 2018-04-13: 5 mg via ORAL
  Filled 2018-04-13: qty 1

## 2018-04-13 NOTE — Telephone Encounter (Signed)
Pts daughter came in and stated that she is unhappy with [facility] and said that she is very rude and would like to start seeing Dr. Delane Ginger again for med management instead? Please call pts daughter back and let her know if this is ok.

## 2018-04-13 NOTE — ED Notes (Addendum)
See triage note  Presents with right hip pain   States she had hip surgery about 9 months ago. States she was told that she has to have her revision surgery to same hip. But that has not been scheduled   And has had pain since   Pain became worse today  having increased pain with ambulation  But denies recent injury  Also per family she is waiting to get into a new pain clinic

## 2018-04-13 NOTE — ED Triage Notes (Signed)
Says right hip pain for long time. Got worse today. saysit feels like it's popping when she walks on it.

## 2018-04-13 NOTE — ED Provider Notes (Signed)
[facility] Emergency Department Provider Note  ____________________________________________  Time seen: Approximately 11:53 AM  I have reviewed the triage vital signs and the nursing notes.   HISTORY  Chief Complaint Hip Pain    HPI [name] is a 77 y.o. female that presents to the emergency department for evaluation of chronic right knee and hip pain. Pain is sharp and aching. Patient had surgery 9 months ago with Dr. Percell Miller and has had pain since then. She states she has been seen multiple times for same issue without adequate pain management.  She has also been to the emergency department multiple times in the last month for this issue.  She has been seeing pain management but states that "they do not do anything for her."  She has a follow-up appointment with pain management at the beginning of December and daughter talked to pain management this morning and requesting another appointment.  Her daughter called Dr. Debroah Loop office yesterday and phone call was returned today but patient states that she is unsure what Dr. Debroah Loop office said.  She came to the emergency department today because her daughter had a pain management appointment and was able to give her a ride. No bowel or bladder dysfunction or saddle anesthesia. No recent trauma or falls.  Pain has not changed in character today.  No fever, chills.   Past Medical History:  Diagnosis Date  . Acute postoperative pain 02/03/2018  . Anemia   . B12 deficiency   . Bacteremia 10/07/2014  . Bladder incontinence   . Blind left eye   . Cancer ([place])    skin cancer   . Cataract   . Cervical spine fracture ([place])   . Chest pain 07/29/2013  . Compression fracture    C7,  upper T spine -compression fx  . COPD (chronic obstructive pulmonary disease) ([place])   . DDD (degenerative disc disease), lumbar   . Depression    major  . Diffuse myofascial pain syndrome 03/24/2015  . Dyspnea    at times- when  activty  . Fever 10/05/2014  . GERD (gastroesophageal reflux disease)   . Hiatal hernia   . Hyperlipidemia   . Hypertension   . 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 Macular degeneration   . Osteoarthritis   . Osteoporosis   . Pneumonia    years ago  . Reactive airway disease   . Rupture of bowel ([place])   . Sepsis ([place]) 10/08/2014  . Stroke [facility])    "mini stroke"- balance and memory issue  . Stroke ([place])   . Wrist pain, acute 09/10/2012    Patient Active Problem List   Diagnosis Date Noted  . Chronic lower extremity pain (Right) 04/07/2018  . Abnormal MRI, lumbar spine 04/07/2018  . Lumbar facet hypertrophy (Multilevel) (Bilateral) 04/07/2018  . Lumbar foraminal stenosis (L3-4, L4-5) (Bilateral) 04/07/2018  . Annular tear of lumbar disc (L3-4, L4-5) 04/07/2018  . Acute right hip pain 02/26/2018  . Closed hip fracture, sequela (Right) 02/03/2018  . It band syndrome, right 12/18/2017  . History of hip replacement (Right) 12/18/2017  . Ileus ([place]) 10/27/2017  . Primary osteoarthritis of hip 09/30/2017  . History of small bowel obstruction 09/10/2017  . 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 SBO (small bowel obstruction) ([place]) 08/24/2017  . Abnormal x-ray of pelvis 08/13/2017  . Other specified dorsopathies, sacral and sacrococcygeal region 08/04/2017  . Spondylosis without myelopathy or radiculopathy, lumbosacral region 07/17/2017  .  Non-traumatic compression fracture of T3 thoracic vertebra, sequela 07/02/2017  . High risk medication use 07/02/2017  . At high risk for falls 07/02/2017  . Chronic sacroiliac joint pain (Right) 07/02/2017  . CKD (chronic kidney disease) stage 3, GFR 30-59 ml/min (HCC) 04/23/2017  . Chronic knee pain s/p total knee replacement (TKR) (Right) 04/02/2017  . DDD (degenerative disc disease), lumbar 03/05/2017  . Chronic knee pain (Bilateral) (R>L) 03/05/2017  . Osteoarthritis 03/05/2017  . Chronic musculoskeletal  pain 03/05/2017  . Neurogenic pain 03/05/2017  . Chronic myofascial pain 02/12/2017  . Lumbar facet syndrome (Bilateral) (L>R) 02/12/2017  . Long term prescription benzodiazepine use 02/12/2017  . Opiate use 02/12/2017  . Disorder of skeletal system 02/12/2017  . Pharmacologic therapy 02/12/2017  . Problems influencing health status 02/12/2017  . 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 NSAID long-term use 02/12/2017  . DDD (degenerative disc disease), cervical 11/11/2016  . Lumbar facet arthropathy (Bilateral) 11/11/2016  . 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 CAFL (chronic airflow limitation) ([place]) 11/07/2016  . Depression, major, in partial remission ([place]) 11/07/2016  . 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 Bed wetting 11/07/2016  . Long term current use of opiate analgesic 11/07/2016  . Long term prescription opiate use 11/07/2016  . Chronic pain syndrome 11/07/2016  . Chronic neck pain (Secondary Area of Pain) (Bilateral) (L>R) 11/07/2016  . Chronic hip pain (Right) 11/07/2016  . Rib pain 11/07/2016  . Age-related osteoporosis with current pathological fracture with routine healing 09/20/2016  . History of stroke 09/20/2016  . Major depression in remission ([place]) 09/20/2016  . Intertrochanteric fracture of femur, sequela (Right) 09/13/2016  . Fall   . Chronic shoulder pain (Bilateral) 08/12/2016  . Hx of total knee replacement (Bilateral) 04/04/2016  . Chronic shoulder pain (Left) 02/28/2016  . Hoarseness 01/12/2016  . Pharyngoesophageal dysphagia 01/12/2016  . Chronic low back pain [facility] Area [facility]) (Bilateral) (L>R) 10/26/2015  . 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 UTI (urinary tract infection) 06/25/2015  . Syncope, non cardiac 05/22/2015  . Leukopenia 10/07/2014  . Thrombocytopenia ([place]) 10/07/2014  . Hypoxia 10/05/2014  .  Anxiety 06/15/2014  . Apoplectic vertigo 06/15/2014  . Duodenogastric reflux 06/15/2014  . Benign essential HTN 06/15/2014  . Infection of the upper respiratory tract 06/15/2014  . Hypercholesteremia 07/30/2013  . HTN (hypertension) 07/30/2013  . GERD (gastroesophageal reflux disease) 07/30/2013  . Dizziness and giddiness 06/18/2013  . Memory loss 05/03/2013  . Chronic knee pain (Primary Area of Pain) (Right) 09/10/2012    Past Surgical History:  Procedure Laterality Date  . ABDOMINAL HYSTERECTOMY    . ABDOMINAL SURGERY    . APPENDECTOMY    . BLADDER SURGERY    . CHOLECYSTECTOMY    . COLON SURGERY    . COLOSTOMY REVERSAL    . EYE SURGERY     cataracts  . HIP SURGERY Right 08/2016  . INTRAMEDULLARY (IM) NAIL INTERTROCHANTERIC Right 09/13/2016   Procedure: INTRAMEDULLARY (IM) NAIL INTERTROCHANTRIC RIGHT;  Surgeon: Leandrew Koyanagi, MD;  Location: [place];  Service: Orthopedics;  Laterality: Right;  . JOINT REPLACEMENT Bilateral   . KNEE SURGERY    . OSTOMY    . RECTOCELE REPAIR    . TOE SURGERY Right    3rd  . TOTAL HIP ARTHROPLASTY Right 09/30/2017   Procedure: RIGHT HIP IM NAIL REMOVAL WITH CONVERSION TO TOTAL HIP ARTHOPLASTY, anterior approach;  Surgeon:  Renette Butters, MD;  Location: [facility];  Service: Orthopedics;  Laterality: Right;    Prior to Admission medications   Medication Sig Start Date End Date Taking? Authorizing Provider  albuterol (ACCUNEB) 1.25 MG/3ML nebulizer solution Take 1 ampule by nebulization every 6 (six) hours as needed for wheezing.    [provider]  albuterol (PROVENTIL HFA;VENTOLIN HFA) 108 (90 BASE) MCG/ACT inhaler Inhale 2 puffs into the lungs every 6 (six) hours as needed for wheezing or shortness of breath.     [provider]  alendronate (FOSAMAX) 70 MG tablet Take 70 mg by mouth every Monday. Take with a full glass of water on an empty stomach.    [provider]  ALPRAZolam Duanne Moron) 1 MG tablet Take 1 mg by mouth at  bedtime as needed for sleep.    [provider]  amLODipine (NORVASC) 5 MG tablet Take 5 mg by mouth daily.  04/06/13   [provider]  Calcium-Phosphorus-Vitamin D (CALCIUM/D3 ADULT GUMMIES PO) Take 1 tablet by mouth daily.    [provider]  citalopram (CELEXA) 20 MG tablet Take 20 mg by mouth daily.  02/27/17   [provider]  diclofenac sodium (VOLTAREN) 1 % GEL Apply 2 g topically 4 (four) times daily. 04/13/18   Laban Emperor, PA-C  docusate sodium (COLACE) 100 MG capsule Take 200 mg by mouth 2 (two) times daily as needed for moderate constipation.     [provider]  esomeprazole (NEXIUM) 40 MG capsule Take 40 mg by mouth daily.     [provider]  Ferrous Sulfate (IRON) 325 (65 Fe) MG TABS Take 1 tablet (325 mg total) by mouth 2 (two) times daily. Patient taking differently: Take 1 tablet by mouth 2 (two) times a week.  10/01/17   Martensen, Charna Elizabeth III, PA-C  guaiFENesin (MUCINEX) 600 MG 12 hr tablet Take 600 mg by mouth 2 (two) times daily as needed for cough.    [provider]  lidocaine (LIDODERM) 5 % Place 1 patch onto the skin every 12 (twelve) hours. Remove & Discard patch within 12 hours or as directed by MD 03/17/18 03/17/19  Sable Feil, PA-C  linaclotide [facility]) 290 MCG CAPS capsule Take 290 mcg by mouth daily.    [provider]  lovastatin (MEVACOR) 10 MG tablet Take 10 mg by mouth daily. 09/22/14   [provider]  meloxicam (MOBIC) 15 MG tablet Take 15 mg by mouth daily.  05/23/16   [provider]  Menthol, Topical Analgesic, (BIOFREEZE EX) Apply 1 application topically daily as needed (muscle pain).    [provider]  Multiple Vitamins-Minerals (MULTIVITAMIN GUMMIES ADULT PO) Take 1 tablet by mouth daily.    [provider]  ondansetron (ZOFRAN ODT) 4 MG disintegrating tablet Take 1 tablet (4 mg total) by mouth every 8 (eight) hours as needed for nausea  or vomiting. 02/26/18   Eula Listen, MD  polyethylene glycol (MIRALAX / GLYCOLAX) packet Take 17 g by mouth daily as needed (for constipation.).     [provider]  pregabalin (LYRICA) 75 MG capsule Take 1 capsule (75 mg total) by mouth 2 (two) times daily. 04/06/18 10/03/18  Milinda Pointer, MD  traZODone (DESYREL) 100 MG tablet Take 100 mg by mouth at bedtime.     [provider]    Allergies Ampicillin; Ampicillin-sulbactam sodium; Ambien [zolpidem tartrate]; Flexeril [cyclobenzaprine]; Tape; Toradol [ketorolac tromethamine]; Sulfamethoxazole-trimethoprim; Zolpidem; Cephalexin; and Tramadol  Family History  Problem Relation  Age of Onset  . Heart failure Mother   . Heart attack Father     Social History Social History   Tobacco Use  . Smoking status: Former Smoker    Packs/day: 1.50    Years: 30.00    Pack years: 45.00    Types: E-cigarettes, Cigarettes    Last attempt to quit: 2013    Years since quitting: 6.8  . Smokeless tobacco: Never Used  Substance Use Topics  . Alcohol use: No    Alcohol/week: 0.0 standard drinks  . Drug use: No     Review of Systems  Cardiovascular: No chest pain. Respiratory: No SOB. Gastrointestinal: No abdominal pain.  No nausea, no vomiting.  Musculoskeletal: Positive for hip pain.  Skin: Negative for rash, abrasions, lacerations, ecchymosis.   ____________________________________________   PHYSICAL EXAM:  VITAL SIGNS: ED Triage Vitals  Enc Vitals Group     BP 04/13/18 1054 (!) 141/77     Pulse Rate 04/13/18 1054 69     Resp 04/13/18 1054 18     Temp 04/13/18 1054 98.3 F (36.8 C)     Temp Source 04/13/18 1054 Oral     SpO2 04/13/18 1054 97 %     Weight 04/13/18 1055 180 lb (81.6 kg)     Height 04/13/18 1055 5\' 2"  (1.575 m)     Head Circumference --      Peak Flow --      Pain Score 04/13/18 1059 10     Pain Loc --      Pain Edu? --      Excl. in [place]? --      Constitutional: Alert and  oriented. Well appearing and in no acute distress. Eyes: Conjunctivae are normal. PERRL. EOMI. Head: Atraumatic. ENT:      Ears:      Nose: No congestion/rhinnorhea.      Mouth/Throat: Mucous membranes are moist.  Neck: No stridor.   Cardiovascular: Normal rate, regular rhythm.  Good peripheral circulation. Respiratory: Normal respiratory effort without tachypnea or retractions. Lungs CTAB. Good air entry to the bases with no decreased or absent breath sounds. Musculoskeletal: Full range of motion to all extremities. No gross deformities appreciated. Tenderness to palpation to right trochanteric bursa. Well healed laceration to anterior thigh with no surrounding erythema. Full ROM of hip, knee and ankle but with pain.  No LE edema. No tenderness to palpation of L spine.  Neurologic:  Normal speech and language. No gross focal neurologic deficits are appreciated.  Skin:  Skin is warm, dry and intact. No rash noted. Psychiatric: Mood and affect are normal. Speech and behavior are normal. Patient exhibits appropriate insight and judgement.   ____________________________________________   LABS (all labs ordered are listed, but only abnormal results are displayed)  Labs Reviewed - No data to display ____________________________________________  EKG   ____________________________________________  RADIOLOGY   No results found.  ____________________________________________    PROCEDURES  Procedure(s) performed:    Procedures    Medications  oxyCODONE (Oxy IR/ROXICODONE) immediate release tablet 5 mg (5 mg Oral Given 04/13/18 1240)     ____________________________________________   INITIAL IMPRESSION / ASSESSMENT AND PLAN / ED COURSE  Pertinent labs & imaging results that were available during my care of the patient were reviewed by me and considered in my medical decision making (see chart for details).  Review of the [place] CSRS was performed in accordance of the [place]  prior to dispensing any controlled drugs.     Patient's diagnosis  is consistent with chronic right hip pain.  Vital signs and exam are reassuring.  Patient denies any change of hip pain today and states that she came to the emergency department today because she had a ride.  Patient is being seen by pain management clinic.  Daughter has called pain management and Dr. Odelia Gage for an appointment.  Patient will be discharged home with prescriptions for diclofenac cream. Patient is to follow up with pain management as directed. Patient is given ED precautions to return to the ED for any worsening or new symptoms.     ____________________________________________  FINAL CLINICAL IMPRESSION(S) / ED DIAGNOSES  Final diagnoses:  Right hip pain      NEW MEDICATIONS STARTED DURING THIS VISIT:  ED Discharge Orders         Ordered    diclofenac sodium (VOLTAREN) 1 % GEL  4 times daily     04/13/18 1312              This chart was dictated using voice recognition software/Dragon. Despite best efforts to proofread, errors can occur which can change the meaning. Any change was purely unintentional.    Laban Emperor, PA-C 04/13/18 1859    Arta Silence, MD 04/21/18 [phone]

## 2018-04-16 IMAGING — CT CT HEAD W/O CM
3 of 4 series · 16 of 47 positions shown, 19 images · non-contrast
Comparison: 03/26/2016

CLINICAL DATA: Persistent daily headaches for 2 weeks. Confusion
and dizziness. History of strokes. Head injury last week.

EXAM:
CT HEAD WITHOUT CONTRAST
TECHNIQUE: Contiguous axial images were obtained from the base of the skull
through the vertex without intravenous contrast.

[Series 32: 3d filtered head w/o · axial · non-contrast · 0.49mm/px · z∈[-22,+108]mm · 10 of 32 slices shown, 13 images]
[im 3/32  brain]
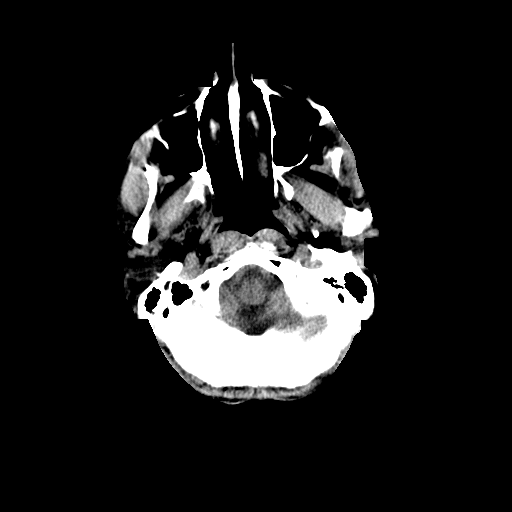
[im 3/32  bone]
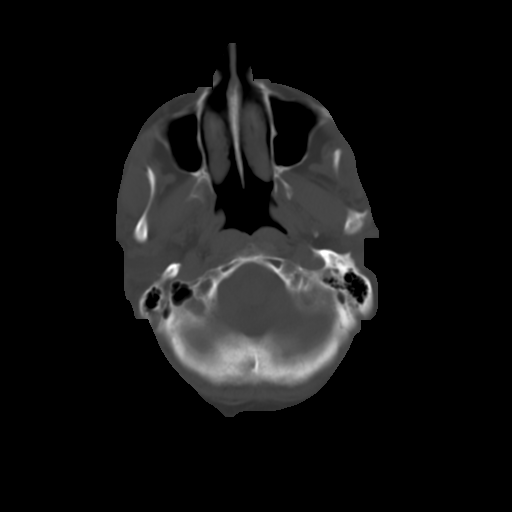
[im 5/32  brain]
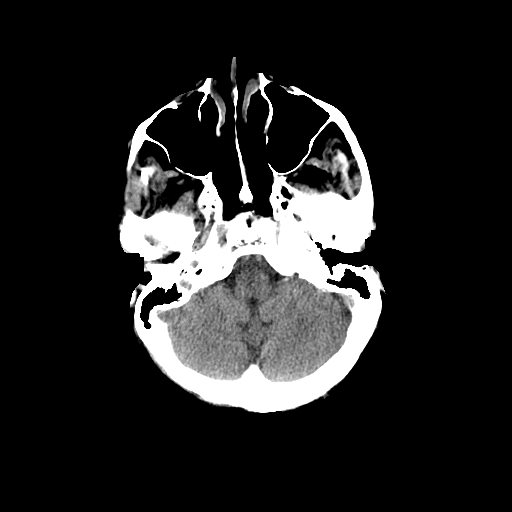
[im 9/32  brain]
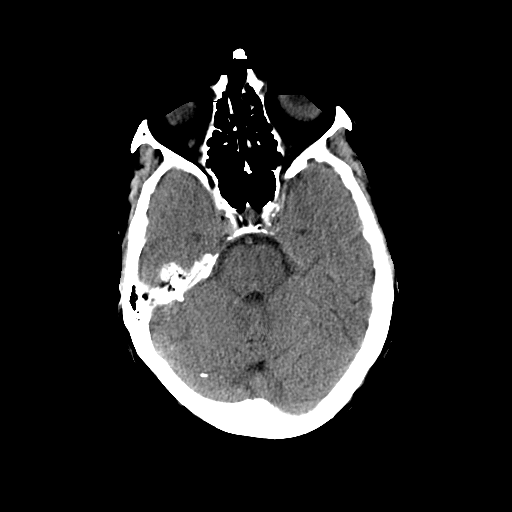
[im 12/32  brain]
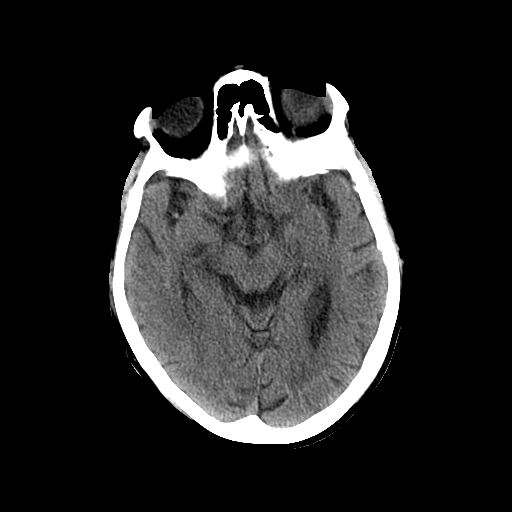
[im 14/32  brain]
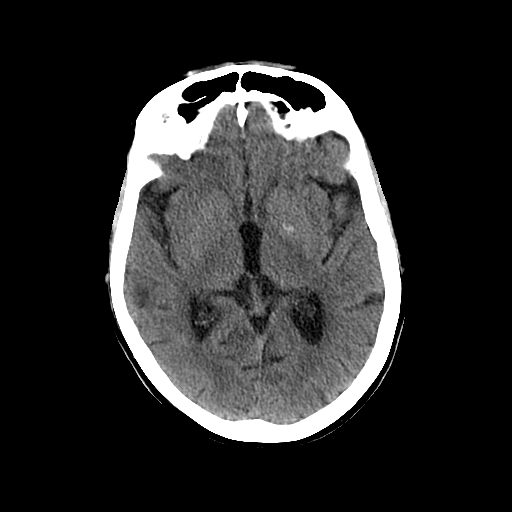
[im 14/32  bone]
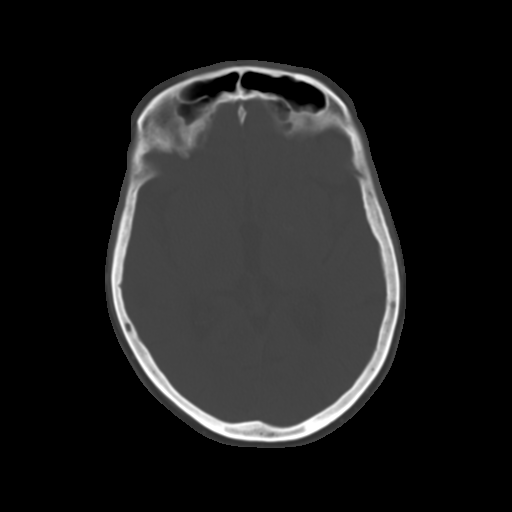
[im 18/32  brain]
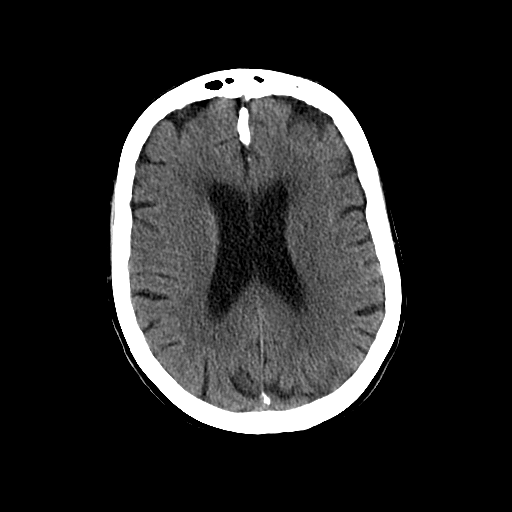
[im 20/32  brain]
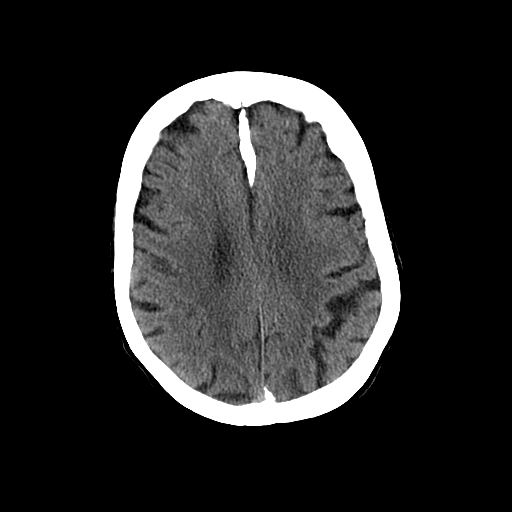
[im 23/32  brain]
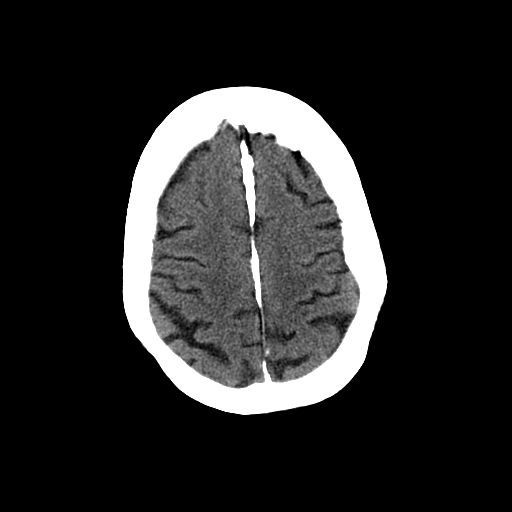
[im 27/32  brain]
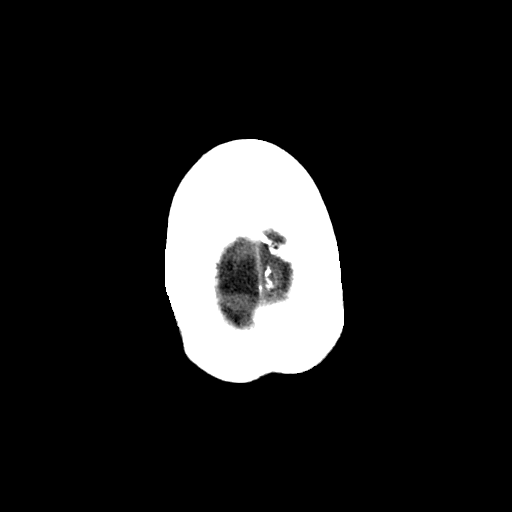
[im 27/32  bone]
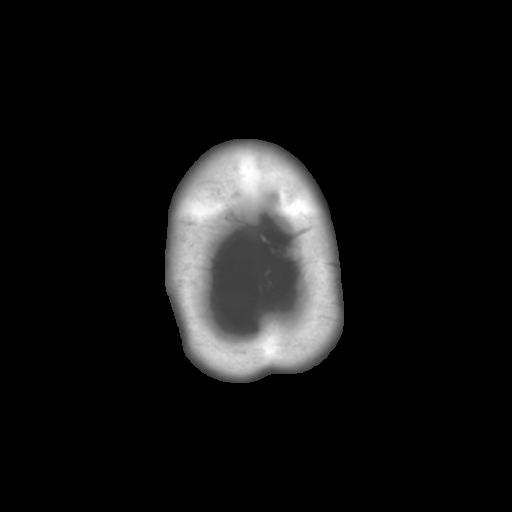
[im 29/32  brain]
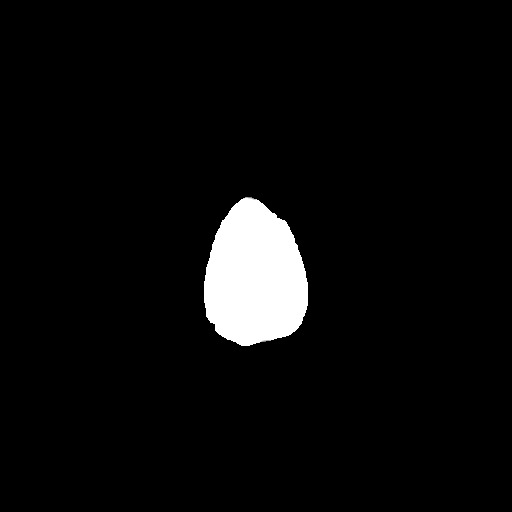

[Series 601: coronal brain · coronal · 0.49mm/px · 3 of 73 slices shown]
[im 25/73  brain]
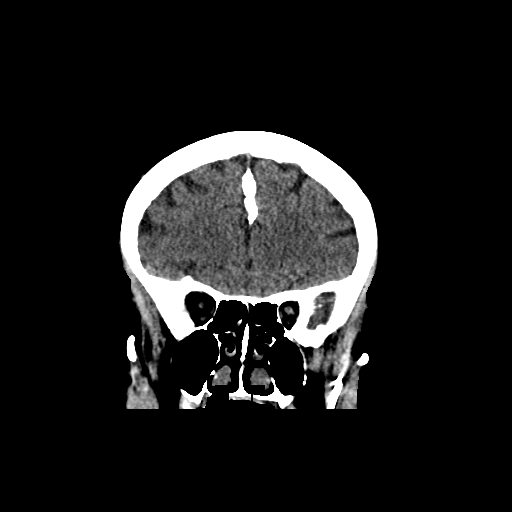
[im 33/73  brain]
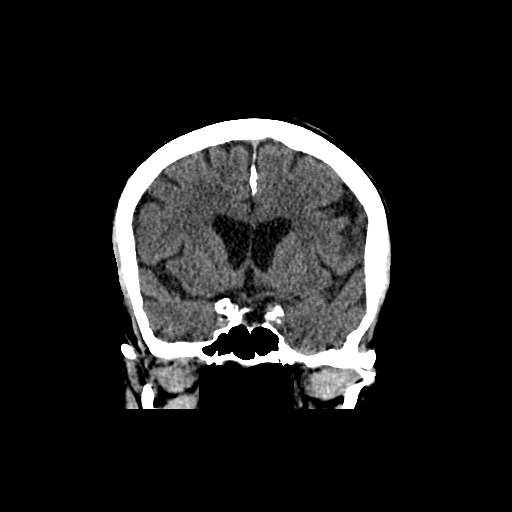
[im 41/73  brain]
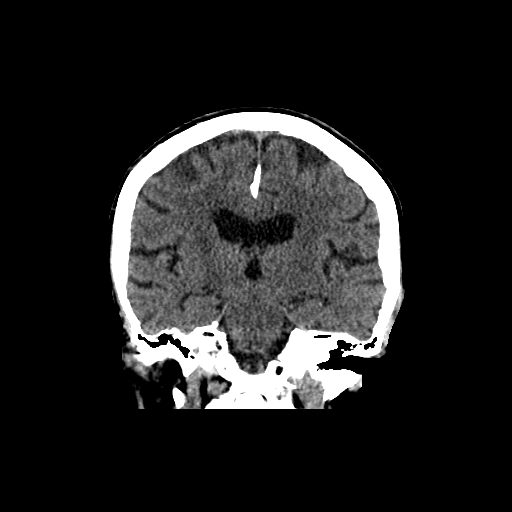

[Series 602: sagittal brain · sagittal · 0.49mm/px · 3 of 55 slices shown]
[im 19/55  brain]
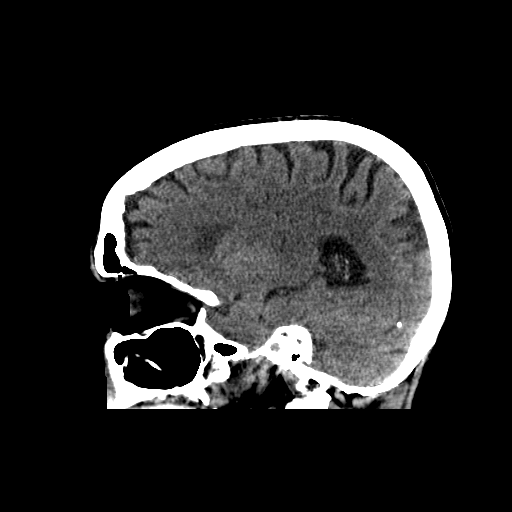
[im 28/55  brain]
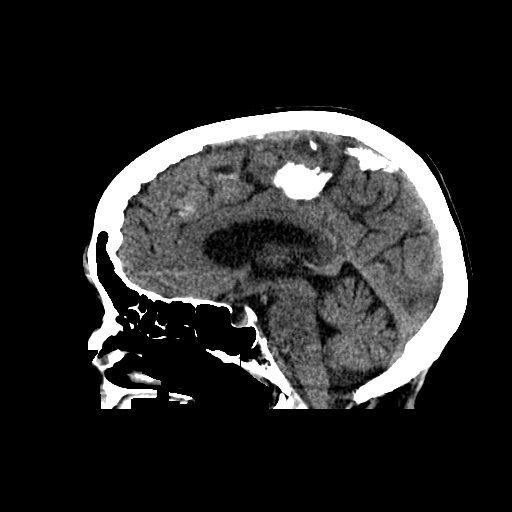
[im 37/55  brain]
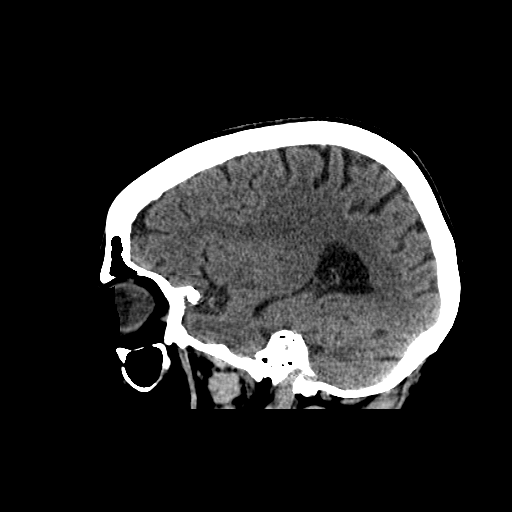

[16 of 47 positions shown; findings below may reference images not displayed]

FINDINGS: Brain: The ventricles are normal in configuration. There is
ventricular and sulcal enlargement reflecting age-appropriate volume
loss. No hydrocephalus.

There are no parenchymal masses or mass effect. There is no evidence
of an infarct. Mild periventricular white matter hypoattenuation is
noted consistent with chronic microvascular ischemic change.

There are no extra-axial masses or abnormal fluid collections.

There is no intracranial hemorrhage.

Vascular: No hyperdense vessel or unexpected calcification.

Skull: Normal. Negative for fracture or focal lesion.

Sinuses/Orbits: Globes and orbits are unremarkable. Visualized
sinuses and mastoid air cells are clear.

Other: None.
IMPRESSION: 1. No acute intracranial abnormalities.
2. Age related volume loss. Mild chronic microvascular ischemic
change.

## 2018-04-17 ENCOUNTER — Emergency Department: Payer: Medicare Other

## 2018-04-17 ENCOUNTER — Encounter: Payer: Self-pay | Admitting: Emergency Medicine

## 2018-04-17 ENCOUNTER — Other Ambulatory Visit: Payer: Self-pay

## 2018-04-17 ENCOUNTER — Emergency Department
Admission: EM | Admit: 2018-04-17 | Discharge: 2018-04-17 | Disposition: A | Discharge status: Home / Self Care | Payer: Medicare Other | Attending: Student in an Organized Health Care Education/Training Program | Admitting: Student in an Organized Health Care Education/Training Program

## 2018-04-17 DIAGNOSIS — Z96652 Presence of left artificial knee joint: Secondary | ICD-10-CM | POA: Insufficient documentation

## 2018-04-17 DIAGNOSIS — Z85828 Personal history of other malignant neoplasm of skin: Secondary | ICD-10-CM | POA: Diagnosis not present

## 2018-04-17 DIAGNOSIS — I129 Hypertensive chronic kidney disease with stage 1 through stage 4 chronic kidney disease, or unspecified chronic kidney disease: Secondary | ICD-10-CM | POA: Insufficient documentation

## 2018-04-17 DIAGNOSIS — Z8673 Personal history of transient ischemic attack (TIA), and cerebral infarction without residual deficits: Secondary | ICD-10-CM | POA: Diagnosis not present

## 2018-04-17 DIAGNOSIS — N183 Chronic kidney disease, stage 3 (moderate): Secondary | ICD-10-CM | POA: Diagnosis not present

## 2018-04-17 DIAGNOSIS — G8929 Other chronic pain: Secondary | ICD-10-CM | POA: Diagnosis not present

## 2018-04-17 DIAGNOSIS — Z87891 Personal history of nicotine dependence: Secondary | ICD-10-CM | POA: Diagnosis not present

## 2018-04-17 DIAGNOSIS — M25551 Pain in right hip: Secondary | ICD-10-CM | POA: Insufficient documentation

## 2018-04-17 DIAGNOSIS — Z79899 Other long term (current) drug therapy: Secondary | ICD-10-CM | POA: Diagnosis not present

## 2018-04-17 DIAGNOSIS — Z96651 Presence of right artificial knee joint: Secondary | ICD-10-CM | POA: Diagnosis not present

## 2018-04-17 DIAGNOSIS — Z96641 Presence of right artificial hip joint: Secondary | ICD-10-CM | POA: Diagnosis not present

## 2018-04-17 MED ORDER — OXYCODONE-ACETAMINOPHEN 5-325 MG PO TABS: 1.0000 | ORAL_TABLET | Freq: Four times a day (QID) | ORAL | 0 refills | Status: DC | PRN

## 2018-04-17 MED ORDER — DEXAMETHASONE SODIUM PHOSPHATE 10 MG/ML IJ SOLN
10.0000 mg | Freq: Once | INTRAMUSCULAR | Status: AC
  Administered 2018-04-17: 10 mg via INTRAMUSCULAR
  Filled 2018-04-17: qty 1

## 2018-04-17 MED ORDER — FENTANYL CITRATE (PF) 100 MCG/2ML IJ SOLN
25.0000 ug | Freq: Once | INTRAMUSCULAR | Status: AC
  Administered 2018-04-17: 25 ug via INTRAVENOUS
  Filled 2018-04-17: qty 2

## 2018-04-17 MED ORDER — ONDANSETRON 4 MG PO TBDP
4.0000 mg | ORAL_TABLET | Freq: Once | ORAL | Status: AC
  Administered 2018-04-17: 4 mg via ORAL
  Filled 2018-04-17: qty 1

## 2018-04-17 MED ORDER — OXYCODONE-ACETAMINOPHEN 5-325 MG PO TABS
1.0000 | ORAL_TABLET | Freq: Once | ORAL | Status: AC
  Administered 2018-04-17: 1 via ORAL
  Filled 2018-04-17: qty 1

## 2018-04-17 NOTE — ED Notes (Signed)
See triage note  Presents with chronic hip pain  States she has had pain to right since surgery in May  Was going to pain clinic but daughter states they are waiting to get into another clinic  And she recently got a new PCP

## 2018-04-17 NOTE — ED Triage Notes (Signed)
Patient from home via [facility]. Per daughter, patient had hip surgery in may on right hip. States that since then she has had worsening pain and has not been able to get any help from follow-up appointments.

## 2018-04-17 NOTE — Discharge Instructions (Addendum)
Keep your appointment with your orthopedist on Monday. Take oxycodone every 6 hours at home as needed for pain.  Be aware that this medication could cause drowsiness and increase your risk for falling.

## 2018-04-17 NOTE — ED Provider Notes (Signed)
[facility] Emergency Department Provider Note   ____________________________________________   First MD Initiated Contact with Patient 04/17/18 1114     (approximate)  I have reviewed the triage vital signs and the nursing notes.   HISTORY  Chief Complaint Hip Pain   HPI [name] is a 77 y.o. female presents to the ED via EMS with complaint of right hip pain.  Patient states that she had surgery in May 2019 in [place] and has had problems with her hip since that time.  She states pain has continued to worsen and has not gotten any relief of her pain with multiple appointments.  Daughter states that she has an appointment with Dr. Percell Miller who is the orthopedist in [facility] who did her surgery on Monday, 11/25.  Patient states that she is not having any pain medication at home and is unable to stand with the use of her walker.  Denies any recent injury to her hip.  She rates her pain as a 10/10.  Patient was being seen at the pain clinic in which "she did not get any help".  Patient's daughter states that she had a CT scan that showed that the prosthetic device in her hip is moving.  Patient was seen in the ED on 04/13/2018 for right hip pain.  Patient states that she is continued to be painful and unable to walk without assistance.   Past Medical History:  Diagnosis Date  . Acute postoperative pain 02/03/2018  . Anemia   . B12 deficiency   . Bacteremia 10/07/2014  . Bladder incontinence   . Blind left eye   . Cancer ([place])    skin cancer   . Cataract   . Cervical spine fracture ([place])   . Chest pain 07/29/2013  . Compression fracture    C7,  upper T spine -compression fx  . COPD (chronic obstructive pulmonary disease) ([place])   . DDD (degenerative disc disease), lumbar   . Depression    major  . Diffuse myofascial pain syndrome 03/24/2015  . Dyspnea    at times- when activty  . Fever 10/05/2014  . GERD (gastroesophageal reflux disease)   .  Hiatal hernia   . Hyperlipidemia   . Hypertension   . 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 Macular degeneration   . Osteoarthritis   . Osteoporosis   . Pneumonia    years ago  . Reactive airway disease   . Rupture of bowel ([place])   . Sepsis ([place]) 10/08/2014  . Stroke [facility])    "mini stroke"- balance and memory issue  . Stroke ([place])   . Wrist pain, acute 09/10/2012    Patient Active Problem List   Diagnosis Date Noted  . Chronic lower extremity pain (Right) 04/07/2018  . Abnormal MRI, lumbar spine 04/07/2018  . Lumbar facet hypertrophy (Multilevel) (Bilateral) 04/07/2018  . Lumbar foraminal stenosis (L3-4, L4-5) (Bilateral) 04/07/2018  . Annular tear of lumbar disc (L3-4, L4-5) 04/07/2018  . Acute right hip pain 02/26/2018  . Closed hip fracture, sequela (Right) 02/03/2018  . It band syndrome, right 12/18/2017  . History of hip replacement (Right) 12/18/2017  . Ileus ([place]) 10/27/2017  . Primary osteoarthritis of hip 09/30/2017  . History of small bowel obstruction 09/10/2017  . 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 SBO (small bowel obstruction) ([place]) 08/24/2017  . Abnormal x-ray of pelvis 08/13/2017  . Other specified dorsopathies, sacral and sacrococcygeal region 08/04/2017  . Spondylosis  without myelopathy or radiculopathy, lumbosacral region 07/17/2017  . Non-traumatic compression fracture of T3 thoracic vertebra, sequela 07/02/2017  . High risk medication use 07/02/2017  . At high risk for falls 07/02/2017  . Chronic sacroiliac joint pain (Right) 07/02/2017  . CKD (chronic kidney disease) stage 3, GFR 30-59 ml/min (HCC) 04/23/2017  . Chronic knee pain s/p total knee replacement (TKR) (Right) 04/02/2017  . DDD (degenerative disc disease), lumbar 03/05/2017  . Chronic knee pain (Bilateral) (R>L) 03/05/2017  . Osteoarthritis 03/05/2017  . Chronic musculoskeletal pain 03/05/2017  . Neurogenic pain 03/05/2017  . Chronic myofascial pain  02/12/2017  . Lumbar facet syndrome (Bilateral) (L>R) 02/12/2017  . Long term prescription benzodiazepine use 02/12/2017  . Opiate use 02/12/2017  . Disorder of skeletal system 02/12/2017  . Pharmacologic therapy 02/12/2017  . Problems influencing health status 02/12/2017  . 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 NSAID long-term use 02/12/2017  . DDD (degenerative disc disease), cervical 11/11/2016  . Lumbar facet arthropathy (Bilateral) 11/11/2016  . 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 CAFL (chronic airflow limitation) ([place]) 11/07/2016  . Depression, major, in partial remission ([place]) 11/07/2016  . 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 Bed wetting 11/07/2016  . Long term current use of opiate analgesic 11/07/2016  . Long term prescription opiate use 11/07/2016  . Chronic pain syndrome 11/07/2016  . Chronic neck pain (Secondary Area of Pain) (Bilateral) (L>R) 11/07/2016  . Chronic hip pain (Right) 11/07/2016  . Rib pain 11/07/2016  . Age-related osteoporosis with current pathological fracture with routine healing 09/20/2016  . History of stroke 09/20/2016  . Major depression in remission ([place]) 09/20/2016  . Intertrochanteric fracture of femur, sequela (Right) 09/13/2016  . Fall   . Chronic shoulder pain (Bilateral) 08/12/2016  . Hx of total knee replacement (Bilateral) 04/04/2016  . Chronic shoulder pain (Left) 02/28/2016  . Hoarseness 01/12/2016  . Pharyngoesophageal dysphagia 01/12/2016  . Chronic low back pain [facility] Area of Pain) (Bilateral) (L>R) 10/26/2015  . 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 UTI (urinary tract infection) 06/25/2015  . Syncope, non cardiac 05/22/2015  . Leukopenia 10/07/2014  . Thrombocytopenia ([place]) 10/07/2014  . Hypoxia 10/05/2014  . Anxiety 06/15/2014  . Apoplectic vertigo 06/15/2014  . Duodenogastric reflux  06/15/2014  . Benign essential HTN 06/15/2014  . Infection of the upper respiratory tract 06/15/2014  . Hypercholesteremia 07/30/2013  . HTN (hypertension) 07/30/2013  . GERD (gastroesophageal reflux disease) 07/30/2013  . Dizziness and giddiness 06/18/2013  . Memory loss 05/03/2013  . Chronic knee pain (Primary Area of Pain) (Right) 09/10/2012    Past Surgical History:  Procedure Laterality Date  . ABDOMINAL HYSTERECTOMY    . ABDOMINAL SURGERY    . APPENDECTOMY    . BLADDER SURGERY    . CHOLECYSTECTOMY    . COLON SURGERY    . COLOSTOMY REVERSAL    . EYE SURGERY     cataracts  . HIP SURGERY Right 08/2016  . INTRAMEDULLARY (IM) NAIL INTERTROCHANTERIC Right 09/13/2016   Procedure: INTRAMEDULLARY (IM) NAIL INTERTROCHANTRIC RIGHT;  Surgeon: Leandrew Koyanagi, MD;  Location: [place];  Service: Orthopedics;  Laterality: Right;  . JOINT REPLACEMENT Bilateral   . KNEE SURGERY    . OSTOMY    . RECTOCELE REPAIR    . TOE SURGERY Right    3rd  . TOTAL HIP ARTHROPLASTY Right 09/30/2017   Procedure: RIGHT HIP IM NAIL REMOVAL WITH  CONVERSION TO TOTAL HIP ARTHOPLASTY, anterior approach;  Surgeon: Renette Butters, MD;  Location: [place];  Service: Orthopedics;  Laterality: Right;    Prior to Admission medications   Medication Sig Start Date End Date Taking? Authorizing Provider  albuterol (ACCUNEB) 1.25 MG/3ML nebulizer solution Take 1 ampule by nebulization every 6 (six) hours as needed for wheezing.    [provider]  albuterol (PROVENTIL HFA;VENTOLIN HFA) 108 (90 BASE) MCG/ACT inhaler Inhale 2 puffs into the lungs every 6 (six) hours as needed for wheezing or shortness of breath.     [provider]  alendronate (FOSAMAX) 70 MG tablet Take 70 mg by mouth every Monday. Take with a full glass of water on an empty stomach.    [provider]  ALPRAZolam Duanne Moron) 1 MG tablet Take 1 mg by mouth at bedtime as needed for sleep.    [provider]  amLODipine (NORVASC) 5  MG tablet Take 5 mg by mouth daily.  04/06/13   [provider]  Calcium-Phosphorus-Vitamin D (CALCIUM/D3 ADULT GUMMIES PO) Take 1 tablet by mouth daily.    [provider]  citalopram (CELEXA) 20 MG tablet Take 20 mg by mouth daily.  02/27/17   [provider]  diclofenac sodium (VOLTAREN) 1 % GEL Apply 2 g topically 4 (four) times daily. 04/13/18   Laban Emperor, PA-C  docusate sodium (COLACE) 100 MG capsule Take 200 mg by mouth 2 (two) times daily as needed for moderate constipation.     [provider]  esomeprazole (NEXIUM) 40 MG capsule Take 40 mg by mouth daily.     [provider]  Ferrous Sulfate (IRON) 325 (65 Fe) MG TABS Take 1 tablet (325 mg total) by mouth 2 (two) times daily. Patient taking differently: Take 1 tablet by mouth 2 (two) times a week.  10/01/17   Martensen, Charna Elizabeth III, PA-C  guaiFENesin (MUCINEX) 600 MG 12 hr tablet Take 600 mg by mouth 2 (two) times daily as needed for cough.    [provider]  lidocaine (LIDODERM) 5 % Place 1 patch onto the skin every 12 (twelve) hours. Remove & Discard patch within 12 hours or as directed by MD 03/17/18 03/17/19  Sable Feil, PA-C  linaclotide [facility]) 290 MCG CAPS capsule Take 290 mcg by mouth daily.    [provider]  lovastatin (MEVACOR) 10 MG tablet Take 10 mg by mouth daily. 09/22/14   [provider]  meloxicam (MOBIC) 15 MG tablet Take 15 mg by mouth daily.  05/23/16   [provider]  Menthol, Topical Analgesic, (BIOFREEZE EX) Apply 1 application topically daily as needed (muscle pain).    [provider]  Multiple Vitamins-Minerals (MULTIVITAMIN GUMMIES ADULT PO) Take 1 tablet by mouth daily.    [provider]  ondansetron (ZOFRAN ODT) 4 MG disintegrating tablet Take 1 tablet (4 mg total) by mouth every 8 (eight) hours as needed for nausea or vomiting. 02/26/18   Eula Listen, MD  oxyCODONE-acetaminophen  (PERCOCET) 5-325 MG tablet Take 1 tablet by mouth every 6 (six) hours as needed for severe pain. 04/17/18   Johnn Hai, PA-C  polyethylene glycol (MIRALAX / GLYCOLAX) packet Take 17 g by mouth daily as needed (for constipation.).     [provider]  pregabalin (LYRICA) 75 MG capsule Take 1 capsule (75 mg total) by mouth 2 (two) times daily. 04/06/18 10/03/18  Milinda Pointer, MD  traZODone (DESYREL) 100 MG tablet Take 100 mg by mouth  at bedtime.     [provider]    Allergies Ampicillin; Ampicillin-sulbactam sodium; Ambien [zolpidem tartrate]; Flexeril [cyclobenzaprine]; Tape; Toradol [ketorolac tromethamine]; Sulfamethoxazole-trimethoprim; Zolpidem; Cephalexin; and Tramadol  Family History  Problem Relation Age of Onset  . Heart failure Mother   . Heart attack Father     Social History Social History   Tobacco Use  . Smoking status: Former Smoker    Packs/day: 1.50    Years: 30.00    Pack years: 45.00    Types: E-cigarettes, Cigarettes    Last attempt to quit: 2013    Years since quitting: 6.8  . Smokeless tobacco: Never Used  Substance Use Topics  . Alcohol use: No    Alcohol/week: 0.0 standard drinks  . Drug use: No    Review of Systems Constitutional: No fever/chills Cardiovascular: Denies chest pain. Respiratory: Denies shortness of breath. Gastrointestinal: No abdominal pain.  No nausea, no vomiting.  Genitourinary: Negative for dysuria. Musculoskeletal: Positive right hip pain, chronic. Skin: Negative for rash. Neurological: Negative for headaches, focal weakness or numbness. ___________________________________________   PHYSICAL EXAM:  VITAL SIGNS: ED Triage Vitals  Enc Vitals Group     BP 04/17/18 1053 134/71     Pulse Rate 04/17/18 1053 (!) 57     Resp 04/17/18 1053 20     Temp 04/17/18 1053 98.2 F (36.8 C)     Temp Source 04/17/18 1053 Oral     SpO2 04/17/18 1053 97 %     Weight 04/17/18 1054 180 lb 0.1 oz (81.7 kg)      Height 04/17/18 1054 5\' 2"  (1.575 m)     Head Circumference --      Peak Flow --      Pain Score 04/17/18 1054 10     Pain Loc --      Pain Edu? --      Excl. in [place]? --    Constitutional: Alert and oriented. Well appearing and in no acute distress.  Patient frequently and loudly moans but no tears were noted on multiple exams.  Patient and daughter were loudly disgruntled in the choice of narcotic that was given to her in the ED stating that "it was never going to work". Eyes: Conjunctivae are normal.  Head: Atraumatic. Neck: No stridor.   Cardiovascular: Normal rate, regular rhythm. Grossly normal heart sounds.  Good peripheral circulation. Respiratory: Normal respiratory effort.  No retractions. Lungs CTAB. Gastrointestinal: Soft and nontender. No distention.  Musculoskeletal: Examination of the right hip there is no gross deformity noted and patient is able to passively move her lower extremity despite frequent comments that she cannot.  There is no warmth, erythema or edema.  There is no point tenderness or evidence of soft tissue injury.  There is no rotation or shortening of the extremity.  Patient during the course of her stay was able to sit up and stand to weight-bear from the bathroom to the stretcher and then again during her exam with this provider. Neurologic:  Normal speech and language. No gross focal neurologic deficits are appreciated.  Skin:  Skin is warm, dry and intact. No rash noted. Psychiatric: Mood and affect are normal. Speech and behavior are normal.  ____________________________________________   LABS (all labs ordered are listed, but only abnormal results are displayed)  Labs Reviewed - No data to display RADIOLOGY  ED MD interpretation:  Right hip x-ray is positive for right total hip prosthesis that appears to be intact.  Official radiology report(s): Dg Hip  Unilat  With Pelvis 2-3 Views Right  Result Date: 04/17/2018 CLINICAL DATA:  Worsening right  hip pain following right hip surgery in May 2019. EXAM: DG HIP (WITH OR WITHOUT PELVIS) 2-3V RIGHT COMPARISON:  03/29/2018. FINDINGS: Stable right total hip prosthesis. No fracture or dislocation seen. Diffuse osteopenia. IMPRESSION: 1. No acute abnormality. 2. Hardware intact. Electronically Signed   By: Claudie Revering M.D.   On: 04/17/2018 11:59    ____________________________________________   PROCEDURES  Procedure(s) performed: None  Procedures  Critical Care performed: No  ____________________________________________   INITIAL IMPRESSION / ASSESSMENT AND PLAN / ED COURSE  As part of my medical decision making, I reviewed the following data within the electronic MEDICAL RECORD NUMBER Notes from prior ED visits and [place] Controlled Substance Database  Patient presents to the ED via EMS with complaint of chronic right hip pain.  Patient had total hip replacement in May 2019 by Dr. Percell Miller who is the orthopedist in [place].  Patient has been seen multiple times for the same issue.  Daughter is present with patient states that she has an appointment on Monday 11/25 with Dr. Percell Miller.  Patient states that she cannot continue to be in this pain.  She has been seen at the pain clinic but has not seen Dr. Dossie Arbour recently.  Epic records indicate that patient did have an office visit with Dr. Dossie Arbour on 04/06/2018.  Patient was given Percocet 5/325 and Zofran 4 mg ODT without any relief of her pain.  X-rays were obtained which showed hardware intact.  Patient was given Decadron 10 mg and fentanyl 25 mg IM.  Patient was noted to be talking with the daughter without any groaning.  I discussed with her that at her age that we cannot give any further medication as narcotics could cause drowsiness and increase her risk for falling and further injuries.  Patient is strongly encouraged to keep her appointment with Dr. Percell Miller on Monday.  Patient was taken to the daughter's car via wheelchair without any further  incident.  ____________________________________________   FINAL CLINICAL IMPRESSION(S) / ED DIAGNOSES  Final diagnoses:  Chronic right hip pain     ED Discharge Orders         Ordered    oxyCODONE-acetaminophen (PERCOCET) 5-325 MG tablet  Every 6 hours PRN     04/17/18 1349           Note:  This document was prepared using Dragon voice recognition software and may include unintentional dictation errors.    Johnn Hai, PA-C 04/17/18 1903    Merlyn Lot, MD 04/18/18 [phone]

## 2018-04-20 ENCOUNTER — Encounter (HOSPITAL_COMMUNITY): Payer: Self-pay

## 2018-04-20 ENCOUNTER — Other Ambulatory Visit: Payer: Self-pay

## 2018-04-20 ENCOUNTER — Emergency Department (HOSPITAL_COMMUNITY): Payer: Medicare Other

## 2018-04-20 ENCOUNTER — Inpatient Hospital Stay (HOSPITAL_COMMUNITY)
Admission: EM | Admit: 2018-04-20 | Discharge: 2018-04-25 | DRG: 193 | Disposition: A | Discharge status: Home Health | Payer: Medicare Other | Attending: Internal Medicine | Admitting: Internal Medicine

## 2018-04-20 DIAGNOSIS — M25551 Pain in right hip: Secondary | ICD-10-CM | POA: Diagnosis not present

## 2018-04-20 DIAGNOSIS — G894 Chronic pain syndrome: Secondary | ICD-10-CM | POA: Diagnosis present

## 2018-04-20 DIAGNOSIS — N179 Acute kidney failure, unspecified: Secondary | ICD-10-CM | POA: Diagnosis present

## 2018-04-20 DIAGNOSIS — M5136 Other intervertebral disc degeneration, lumbar region: Secondary | ICD-10-CM | POA: Diagnosis present

## 2018-04-20 DIAGNOSIS — E869 Volume depletion, unspecified: Secondary | ICD-10-CM | POA: Diagnosis present

## 2018-04-20 DIAGNOSIS — M1611 Unilateral primary osteoarthritis, right hip: Secondary | ICD-10-CM | POA: Diagnosis present

## 2018-04-20 DIAGNOSIS — R0902 Hypoxemia: Secondary | ICD-10-CM

## 2018-04-20 DIAGNOSIS — N390 Urinary tract infection, site not specified: Secondary | ICD-10-CM

## 2018-04-20 DIAGNOSIS — F419 Anxiety disorder, unspecified: Secondary | ICD-10-CM | POA: Diagnosis present

## 2018-04-20 DIAGNOSIS — R402143 Coma scale, eyes open, spontaneous, at hospital admission: Secondary | ICD-10-CM | POA: Diagnosis present

## 2018-04-20 DIAGNOSIS — R32 Unspecified urinary incontinence: Secondary | ICD-10-CM | POA: Diagnosis present

## 2018-04-20 DIAGNOSIS — Z880 Allergy status to penicillin: Secondary | ICD-10-CM

## 2018-04-20 DIAGNOSIS — M810 Age-related osteoporosis without current pathological fracture: Secondary | ICD-10-CM | POA: Diagnosis present

## 2018-04-20 DIAGNOSIS — Z969 Presence of functional implant, unspecified: Secondary | ICD-10-CM | POA: Diagnosis present

## 2018-04-20 DIAGNOSIS — Z87891 Personal history of nicotine dependence: Secondary | ICD-10-CM

## 2018-04-20 DIAGNOSIS — R402253 Coma scale, best verbal response, oriented, at hospital admission: Secondary | ICD-10-CM | POA: Diagnosis present

## 2018-04-20 DIAGNOSIS — I10 Essential (primary) hypertension: Secondary | ICD-10-CM | POA: Diagnosis not present

## 2018-04-20 DIAGNOSIS — Z888 Allergy status to other drugs, medicaments and biological substances status: Secondary | ICD-10-CM

## 2018-04-20 DIAGNOSIS — D509 Iron deficiency anemia, unspecified: Secondary | ICD-10-CM | POA: Diagnosis present

## 2018-04-20 DIAGNOSIS — Z96641 Presence of right artificial hip joint: Secondary | ICD-10-CM | POA: Diagnosis present

## 2018-04-20 DIAGNOSIS — Z791 Long term (current) use of non-steroidal anti-inflammatories (NSAID): Secondary | ICD-10-CM

## 2018-04-20 DIAGNOSIS — R402363 Coma scale, best motor response, obeys commands, at hospital admission: Secondary | ICD-10-CM | POA: Diagnosis present

## 2018-04-20 DIAGNOSIS — I129 Hypertensive chronic kidney disease with stage 1 through stage 4 chronic kidney disease, or unspecified chronic kidney disease: Secondary | ICD-10-CM | POA: Diagnosis present

## 2018-04-20 DIAGNOSIS — I517 Cardiomegaly: Secondary | ICD-10-CM | POA: Diagnosis present

## 2018-04-20 DIAGNOSIS — Z886 Allergy status to analgesic agent status: Secondary | ICD-10-CM

## 2018-04-20 DIAGNOSIS — G9341 Metabolic encephalopathy: Secondary | ICD-10-CM | POA: Diagnosis present

## 2018-04-20 DIAGNOSIS — N183 Chronic kidney disease, stage 3 unspecified: Secondary | ICD-10-CM | POA: Diagnosis present

## 2018-04-20 DIAGNOSIS — F329 Major depressive disorder, single episode, unspecified: Secondary | ICD-10-CM | POA: Diagnosis present

## 2018-04-20 DIAGNOSIS — J9601 Acute respiratory failure with hypoxia: Secondary | ICD-10-CM | POA: Diagnosis present

## 2018-04-20 DIAGNOSIS — B9689 Other specified bacterial agents as the cause of diseases classified elsewhere: Secondary | ICD-10-CM | POA: Diagnosis present

## 2018-04-20 DIAGNOSIS — H5462 Unqualified visual loss, left eye, normal vision right eye: Secondary | ICD-10-CM | POA: Diagnosis present

## 2018-04-20 DIAGNOSIS — J189 Pneumonia, unspecified organism: Secondary | ICD-10-CM | POA: Diagnosis present

## 2018-04-20 DIAGNOSIS — K219 Gastro-esophageal reflux disease without esophagitis: Secondary | ICD-10-CM | POA: Diagnosis present

## 2018-04-20 DIAGNOSIS — E7800 Pure hypercholesterolemia, unspecified: Secondary | ICD-10-CM | POA: Diagnosis present

## 2018-04-20 DIAGNOSIS — J9621 Acute and chronic respiratory failure with hypoxia: Secondary | ICD-10-CM | POA: Diagnosis not present

## 2018-04-20 DIAGNOSIS — J449 Chronic obstructive pulmonary disease, unspecified: Secondary | ICD-10-CM | POA: Diagnosis present

## 2018-04-20 DIAGNOSIS — G8929 Other chronic pain: Secondary | ICD-10-CM | POA: Diagnosis not present

## 2018-04-20 DIAGNOSIS — F324 Major depressive disorder, single episode, in partial remission: Secondary | ICD-10-CM | POA: Diagnosis present

## 2018-04-20 DIAGNOSIS — Z79891 Long term (current) use of opiate analgesic: Secondary | ICD-10-CM

## 2018-04-20 DIAGNOSIS — J441 Chronic obstructive pulmonary disease with (acute) exacerbation: Secondary | ICD-10-CM | POA: Diagnosis present

## 2018-04-20 DIAGNOSIS — Z7983 Long term (current) use of bisphosphonates: Secondary | ICD-10-CM

## 2018-04-20 DIAGNOSIS — Z8673 Personal history of transient ischemic attack (TIA), and cerebral infarction without residual deficits: Secondary | ICD-10-CM

## 2018-04-20 DIAGNOSIS — Z885 Allergy status to narcotic agent status: Secondary | ICD-10-CM

## 2018-04-20 DIAGNOSIS — J440 Chronic obstructive pulmonary disease with acute lower respiratory infection: Secondary | ICD-10-CM | POA: Diagnosis present

## 2018-04-20 DIAGNOSIS — J9691 Respiratory failure, unspecified with hypoxia: Secondary | ICD-10-CM

## 2018-04-20 DIAGNOSIS — Z9660 Presence of unspecified orthopedic joint implant: Secondary | ICD-10-CM | POA: Diagnosis present

## 2018-04-20 DIAGNOSIS — E785 Hyperlipidemia, unspecified: Secondary | ICD-10-CM | POA: Diagnosis present

## 2018-04-20 DIAGNOSIS — Z79899 Other long term (current) drug therapy: Secondary | ICD-10-CM

## 2018-04-20 DIAGNOSIS — E538 Deficiency of other specified B group vitamins: Secondary | ICD-10-CM | POA: Diagnosis present

## 2018-04-20 DIAGNOSIS — Z882 Allergy status to sulfonamides status: Secondary | ICD-10-CM

## 2018-04-20 DIAGNOSIS — Z91048 Other nonmedicinal substance allergy status: Secondary | ICD-10-CM

## 2018-04-20 LAB — CBC WITH DIFFERENTIAL/PLATELET
Abs Immature Granulocytes: 0.1 10*3/uL — ABNORMAL HIGH (ref 0.00–0.07)
Basophils Absolute: 0 10*3/uL (ref 0.0–0.1)
Basophils Relative: 0 %
Eosinophils Absolute: 0.3 10*3/uL (ref 0.0–0.5)
Eosinophils Relative: 2 %
HCT: 40.7 % (ref 36.0–46.0)
Hemoglobin: 12.4 g/dL (ref 12.0–15.0)
Immature Granulocytes: 1 %
Lymphocytes Relative: 9 %
Lymphs Abs: 1.3 10*3/uL (ref 0.7–4.0)
MCH: 30.1 pg (ref 26.0–34.0)
MCHC: 30.5 g/dL (ref 30.0–36.0)
MCV: 98.8 fL (ref 80.0–100.0)
Monocytes Absolute: 0.7 10*3/uL (ref 0.1–1.0)
Monocytes Relative: 5 %
Neutro Abs: 12.2 10*3/uL — ABNORMAL HIGH (ref 1.7–7.7)
Neutrophils Relative %: 83 %
Platelets: 133 10*3/uL — ABNORMAL LOW (ref 150–400)
RBC: 4.12 MIL/uL (ref 3.87–5.11)
RDW: 14.8 % (ref 11.5–15.5)
WBC: 14.6 10*3/uL — ABNORMAL HIGH (ref 4.0–10.5)
nRBC: 0 % (ref 0.0–0.2)

## 2018-04-20 LAB — URINALYSIS, ROUTINE W REFLEX MICROSCOPIC
Bilirubin Urine: NEGATIVE
Glucose, UA: NEGATIVE mg/dL
Hgb urine dipstick: NEGATIVE
Ketones, ur: NEGATIVE mg/dL
Nitrite: NEGATIVE
Protein, ur: NEGATIVE mg/dL
Specific Gravity, Urine: 1.009 (ref 1.005–1.030)
WBC, UA: >50 WBC/hpf — ABNORMAL HIGH (ref 0–5)
pH: 6 (ref 5.0–8.0)

## 2018-04-20 LAB — COMPREHENSIVE METABOLIC PANEL
ALT: 51 U/L — ABNORMAL HIGH (ref 0–44)
AST: 24 U/L (ref 15–41)
Albumin: 2.6 g/dL — ABNORMAL LOW (ref 3.5–5.0)
Alkaline Phosphatase: 52 U/L (ref 38–126)
Anion gap: 6 (ref 5–15)
BUN: 16 mg/dL (ref 8–23)
CO2: 24 mmol/L (ref 22–32)
Calcium: 8.6 mg/dL — ABNORMAL LOW (ref 8.9–10.3)
Chloride: 109 mmol/L (ref 98–111)
Creatinine, Ser: 1.27 mg/dL — ABNORMAL HIGH (ref 0.44–1.00)
GFR calc Af Amer: 46 mL/min — ABNORMAL LOW (ref >60)
GFR calc non Af Amer: 40 mL/min — ABNORMAL LOW (ref >60)
Glucose, Bld: 95 mg/dL (ref 70–99)
Potassium: 3.6 mmol/L (ref 3.5–5.1)
Sodium: 139 mmol/L (ref 135–145)
Total Bilirubin: 0.7 mg/dL (ref 0.3–1.2)
Total Protein: 5.8 g/dL — ABNORMAL LOW (ref 6.5–8.1)

## 2018-04-20 LAB — MRSA PCR SCREENING: MRSA by PCR: NEGATIVE

## 2018-04-20 LAB — TROPONIN I: Troponin I: <0.03 ng/mL (ref <0.03)

## 2018-04-20 LAB — STREP PNEUMONIAE URINARY ANTIGEN: Strep Pneumo Urinary Antigen: NEGATIVE

## 2018-04-20 MED ORDER — LINACLOTIDE 145 MCG PO CAPS
290.0000 ug | ORAL_CAPSULE | Freq: Every day | ORAL | Status: DC
  Administered 2018-04-20 – 2018-04-25 (×6): 290 ug via ORAL
  Filled 2018-04-20 (×6): qty 2

## 2018-04-20 MED ORDER — TRAZODONE HCL 100 MG PO TABS
100.0000 mg | ORAL_TABLET | Freq: Every day | ORAL | Status: DC
  Administered 2018-04-20 – 2018-04-24 (×5): 100 mg via ORAL
  Filled 2018-04-20 (×5): qty 1

## 2018-04-20 MED ORDER — LIDOCAINE 5 % EX PTCH
1.0000 | MEDICATED_PATCH | Freq: Two times a day (BID) | CUTANEOUS | Status: DC
  Administered 2018-04-20 – 2018-04-25 (×11): 1 via TRANSDERMAL
  Filled 2018-04-20 (×11): qty 1

## 2018-04-20 MED ORDER — LEVOFLOXACIN IN D5W 750 MG/150ML IV SOLN
750.0000 mg | Freq: Once | INTRAVENOUS | Status: AC
  Administered 2018-04-20: 750 mg via INTRAVENOUS
  Filled 2018-04-20: qty 150

## 2018-04-20 MED ORDER — DOCUSATE SODIUM 100 MG PO CAPS: 200.0000 mg | ORAL_CAPSULE | Freq: Two times a day (BID) | ORAL | Status: DC | PRN

## 2018-04-20 MED ORDER — POTASSIUM 95 MG PO TABS: 1.0000 | ORAL_TABLET | Freq: Every day | ORAL | Status: DC

## 2018-04-20 MED ORDER — LEVOFLOXACIN IN D5W 750 MG/150ML IV SOLN: 750.0000 mg | INTRAVENOUS | Status: DC

## 2018-04-20 MED ORDER — SODIUM CHLORIDE 0.9 % IV SOLN
INTRAVENOUS | Status: DC
  Administered 2018-04-20 – 2018-04-21 (×3): via INTRAVENOUS

## 2018-04-20 MED ORDER — ENOXAPARIN SODIUM 40 MG/0.4ML ~~LOC~~ SOLN
40.0000 mg | SUBCUTANEOUS | Status: DC
  Administered 2018-04-20 – 2018-04-24 (×5): 40 mg via SUBCUTANEOUS
  Filled 2018-04-20 (×5): qty 0.4

## 2018-04-20 MED ORDER — CALCIUM-PHOSPHORUS-VITAMIN D 200-96.6-200 MG-MG-UNIT PO CHEW: CHEWABLE_TABLET | Freq: Every day | ORAL | Status: DC

## 2018-04-20 MED ORDER — ACETAMINOPHEN 325 MG PO TABS
650.0000 mg | ORAL_TABLET | Freq: Four times a day (QID) | ORAL | Status: DC | PRN
  Administered 2018-04-20 – 2018-04-23 (×4): 650 mg via ORAL
  Filled 2018-04-20 (×4): qty 2

## 2018-04-20 MED ORDER — GUAIFENESIN ER 600 MG PO TB12
600.0000 mg | ORAL_TABLET | Freq: Two times a day (BID) | ORAL | Status: DC | PRN
  Administered 2018-04-21 – 2018-04-25 (×3): 600 mg via ORAL
  Filled 2018-04-20 (×3): qty 1

## 2018-04-20 MED ORDER — ONDANSETRON 4 MG PO TBDP
4.0000 mg | ORAL_TABLET | Freq: Three times a day (TID) | ORAL | Status: DC | PRN
  Filled 2018-04-20 (×2): qty 1

## 2018-04-20 MED ORDER — FERROUS SULFATE 325 (65 FE) MG PO TABS
325.0000 mg | ORAL_TABLET | Freq: Two times a day (BID) | ORAL | Status: DC
  Administered 2018-04-20 – 2018-04-21 (×2): 325 mg via ORAL
  Filled 2018-04-20 (×2): qty 1

## 2018-04-20 MED ORDER — POLYETHYLENE GLYCOL 3350 17 G PO PACK: 17.0000 g | PACK | Freq: Every day | ORAL | Status: DC | PRN

## 2018-04-20 MED ORDER — SODIUM CHLORIDE 0.9 % IV BOLUS
500.0000 mL | Freq: Once | INTRAVENOUS | Status: AC
  Administered 2018-04-20: 500 mL via INTRAVENOUS

## 2018-04-20 MED ORDER — DICLOFENAC SODIUM 1 % TD GEL: 2.0000 g | Freq: Four times a day (QID) | TRANSDERMAL | Status: DC

## 2018-04-20 MED ORDER — PREGABALIN 75 MG PO CAPS
75.0000 mg | ORAL_CAPSULE | Freq: Two times a day (BID) | ORAL | Status: DC
  Administered 2018-04-20 – 2018-04-25 (×11): 75 mg via ORAL
  Filled 2018-04-20 (×11): qty 1

## 2018-04-20 MED ORDER — VITAMIN D 25 MCG (1000 UNIT) PO TABS
5000.0000 [IU] | ORAL_TABLET | Freq: Every day | ORAL | Status: DC
  Administered 2018-04-20 – 2018-04-25 (×6): 5000 [IU] via ORAL
  Filled 2018-04-20 (×5): qty 5

## 2018-04-20 MED ORDER — PANTOPRAZOLE SODIUM 40 MG PO TBEC
80.0000 mg | DELAYED_RELEASE_TABLET | Freq: Every day | ORAL | Status: DC
  Administered 2018-04-20 – 2018-04-25 (×6): 80 mg via ORAL
  Filled 2018-04-20 (×6): qty 2

## 2018-04-20 MED ORDER — AMLODIPINE BESYLATE 5 MG PO TABS
5.0000 mg | ORAL_TABLET | Freq: Every day | ORAL | Status: DC
  Administered 2018-04-20 – 2018-04-21 (×2): 5 mg via ORAL
  Filled 2018-04-20 (×2): qty 1

## 2018-04-20 MED ORDER — ALPRAZOLAM 0.5 MG PO TABS
1.0000 mg | ORAL_TABLET | Freq: Every evening | ORAL | Status: DC | PRN
  Administered 2018-04-20 – 2018-04-22 (×2): 1 mg via ORAL
  Filled 2018-04-20 (×2): qty 2

## 2018-04-20 MED ORDER — MELOXICAM 7.5 MG PO TABS
15.0000 mg | ORAL_TABLET | Freq: Every day | ORAL | Status: DC
  Administered 2018-04-20 – 2018-04-25 (×6): 15 mg via ORAL
  Filled 2018-04-20 (×6): qty 2

## 2018-04-20 MED ORDER — KRILL OIL 500 MG PO CAPS: 1.0000 | ORAL_CAPSULE | Freq: Every day | ORAL | Status: DC

## 2018-04-20 MED ORDER — ALBUTEROL SULFATE (2.5 MG/3ML) 0.083% IN NEBU
3.0000 mL | INHALATION_SOLUTION | Freq: Four times a day (QID) | RESPIRATORY_TRACT | Status: DC | PRN
  Administered 2018-04-21 – 2018-04-23 (×4): 3 mL via RESPIRATORY_TRACT
  Filled 2018-04-20 (×4): qty 3

## 2018-04-20 MED ORDER — ALBUTEROL SULFATE 1.25 MG/3ML IN NEBU: 1.0000 | INHALATION_SOLUTION | Freq: Four times a day (QID) | RESPIRATORY_TRACT | Status: DC | PRN

## 2018-04-20 MED ORDER — ACETAMINOPHEN 325 MG PO TABS: 650.0000 mg | ORAL_TABLET | Freq: Four times a day (QID) | ORAL | Status: DC | PRN

## 2018-04-20 MED ORDER — HYDROMORPHONE HCL 1 MG/ML IJ SOLN: 0.5000 mg | Freq: Once | INTRAMUSCULAR | Status: DC

## 2018-04-20 MED ORDER — CYANOCOBALAMIN 500 MCG PO TABS
500.0000 ug | ORAL_TABLET | Freq: Every day | ORAL | Status: DC
  Administered 2018-04-21 – 2018-04-25 (×5): 500 ug via ORAL
  Filled 2018-04-20 (×6): qty 1

## 2018-04-20 MED ORDER — ALENDRONATE SODIUM 70 MG PO TABS: 70.0000 mg | ORAL_TABLET | ORAL | Status: DC

## 2018-04-20 MED ORDER — FLUTICASONE PROPIONATE HFA 220 MCG/ACT IN AERO: 1.0000 | INHALATION_SPRAY | Freq: Two times a day (BID) | RESPIRATORY_TRACT | Status: DC

## 2018-04-20 MED ORDER — PRAVASTATIN SODIUM 10 MG PO TABS
10.0000 mg | ORAL_TABLET | Freq: Every day | ORAL | Status: DC
  Administered 2018-04-20 – 2018-04-24 (×5): 10 mg via ORAL
  Filled 2018-04-20 (×5): qty 1

## 2018-04-20 MED ORDER — MENTHOL (TOPICAL ANALGESIC) 10 % EX LIQD: Freq: Every day | CUTANEOUS | Status: DC | PRN

## 2018-04-20 MED ORDER — OXYCODONE-ACETAMINOPHEN 5-325 MG PO TABS
1.0000 | ORAL_TABLET | Freq: Four times a day (QID) | ORAL | Status: DC | PRN
  Administered 2018-04-20 – 2018-04-21 (×2): 1 via ORAL
  Filled 2018-04-20 (×2): qty 1

## 2018-04-20 MED ORDER — CALCIUM CARBONATE 1250 (500 CA) MG PO TABS
500.0000 mg | ORAL_TABLET | Freq: Every day | ORAL | Status: DC
  Administered 2018-04-20 – 2018-04-25 (×6): 500 mg via ORAL
  Filled 2018-04-20 (×5): qty 1

## 2018-04-20 MED ORDER — SODIUM CHLORIDE 0.9 % IV BOLUS
500.0000 mL | Freq: Once | INTRAVENOUS | Status: AC
  Administered 2018-04-21: 500 mL via INTRAVENOUS

## 2018-04-20 MED ORDER — CITALOPRAM HYDROBROMIDE 20 MG PO TABS
20.0000 mg | ORAL_TABLET | Freq: Every day | ORAL | Status: DC
  Administered 2018-04-20 – 2018-04-25 (×6): 20 mg via ORAL
  Filled 2018-04-20 (×6): qty 1

## 2018-04-20 MED ORDER — BUDESONIDE 0.5 MG/2ML IN SUSP
1.0000 mg | Freq: Two times a day (BID) | RESPIRATORY_TRACT | Status: DC
  Administered 2018-04-20 – 2018-04-24 (×8): 1 mg via RESPIRATORY_TRACT
  Administered 2018-04-24: 0.5 mg via RESPIRATORY_TRACT
  Administered 2018-04-25: 1 mg via RESPIRATORY_TRACT
  Filled 2018-04-20 (×10): qty 4

## 2018-04-20 NOTE — ED Notes (Signed)
Patient transported to X-ray 

## 2018-04-20 NOTE — Care Management Note (Signed)
Case Management Note  Patient Details  Name: [name] MRN: [id number] Date of Birth: 11/02/1940  Subjective/Objective:  From home , lives with daughter, presents with resp failure with hypoxia, chronic airflow limitation, depression, ckd, cap, chronic pain syndrome, htn, anxiety,                   Action/Plan: NCM will follow for transition of care needs.   Expected Discharge Date:                  Expected Discharge Plan:  [place]  In-House Referral:     Discharge planning Services  CM Consult  Post Acute Care Choice:    Choice offered to:     DME Arranged:    DME Agency:     HH Arranged:    [place] Agency:     Status of Service:  In process, will continue to follow  If discussed at Long Length of Stay Meetings, dates discussed:    Additional Comments:  Zenon Mayo, RN 04/20/2018, 4:09 PM

## 2018-04-20 NOTE — H&P (Addendum)
History and Physical    [name] ION:[id number] DOB: 1940/06/28 DOA: 04/20/2018  PCP: Alvester Chou, NP Consultants: None Patient coming from: Home- lives with daughter  Chief Complaint: R hip pain  HPI: [name] is a 77 y.o female with a PMH of COPD, CVA, anemia who presented to the ED via EMS this morning with complaint of right hip pain.  Patient has a history of severe osteoarthritis status post right total hip arthroplasty and has had several ED visits for right hip pain.  Her daughter noticed that she was complaining of more pain the last 2 days and also was somewhat confused.  This morning she had a temperature of 103.  Patient is upset because she states that she is not able to get her oxycodone filled anymore and she does not understand why.  According to the daughter, there was some concern by her providers she was overusing this but patient adamantly denies this.  She was found to be hypoxic to 88% on room air.  She has a history of COPD but has never required home oxygen.  She denies shortness of breath, increase or change in her chronic mild cough, chest pain.  Her daughter thinks that she has had a fever for at least 3 days.  Patient's only complaint is continued right hip pain which is 9 out of 10 with movement and 0 out of 10 if she lies still.  She has had to stop getting physical therapy because the pain is too bad.  She has a right hip bone stimulator currently that was placed in May by Dr. Percell Miller.  Patient has gotten to the point where she is unable to walk or put any weight on her right leg due to her hip pain.  Patient has a history of recurrent UTIs, last one was approximately 2 to 3 months ago.  She does state that she is having lower abdominal/suprapubic burning with urination.  She has chronic nausea and vomiting which is not changed recently.   ED Course: Blood pressure 113/48, heart rate 71, temperature 99.2 which is after Tylenol.  Respirations 23.  Patient was  reportedly somewhat confused on arrival but this resolved by the time of my examination.  She was found to be hypoxic to 88% on room air.  This improved to 98% on 2 L.  Chest x-ray showed patchy bilateral opacities most pronounced in the right lower lobe concerning for pneumonia.  She was given 1 dose of IV Levaquin 750 mg.  Review of Systems: As per HPI; otherwise review of systems reviewed and negative.   Ambulatory Status: Currently not ambulatory due to right hip pain   Past Medical History:  Diagnosis Date  . Acute postoperative pain 02/03/2018  . Anemia   . B12 deficiency   . Bacteremia 10/07/2014  . Bladder incontinence   . Blind left eye   . Cancer ([place])    skin cancer   . Cataract   . Cervical spine fracture ([place])   . Chest pain 07/29/2013  . Compression fracture    C7,  upper T spine -compression fx  . COPD (chronic obstructive pulmonary disease) ([place])   . DDD (degenerative disc disease), lumbar   . Depression    major  . Diffuse myofascial pain syndrome 03/24/2015  . Dyspnea    at times- when activty  . Fever 10/05/2014  . GERD (gastroesophageal reflux disease)   . Hiatal hernia   . Hyperlipidemia   . Hypertension   .  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 Macular degeneration   . Osteoarthritis   . Osteoporosis   . Pneumonia    years ago  . Reactive airway disease   . Rupture of bowel ([place])   . Sepsis ([place]) 10/08/2014  . Stroke [facility])    "mini stroke"- balance and memory issue  . Stroke ([place])   . Wrist pain, acute 09/10/2012    Past Surgical History:  Procedure Laterality Date  . ABDOMINAL HYSTERECTOMY    . ABDOMINAL SURGERY    . APPENDECTOMY    . BLADDER SURGERY    . CHOLECYSTECTOMY    . COLON SURGERY    . COLOSTOMY REVERSAL    . EYE SURGERY     cataracts  . HIP SURGERY Right 08/2016  . INTRAMEDULLARY (IM) NAIL INTERTROCHANTERIC Right 09/13/2016   Procedure: INTRAMEDULLARY (IM) NAIL INTERTROCHANTRIC RIGHT;  Surgeon: Leandrew Koyanagi, MD;   Location: [facility];  Service: Orthopedics;  Laterality: Right;  . JOINT REPLACEMENT Bilateral   . KNEE SURGERY    . OSTOMY    . RECTOCELE REPAIR    . TOE SURGERY Right    3rd  . TOTAL HIP ARTHROPLASTY Right 09/30/2017   Procedure: RIGHT HIP IM NAIL REMOVAL WITH CONVERSION TO TOTAL HIP ARTHOPLASTY, anterior approach;  Surgeon: Renette Butters, MD;  Location: [facility];  Service: Orthopedics;  Laterality: Right;    Social History   Socioeconomic History  . Marital status: Widowed    Spouse name: Not on file  . Number of children: 3  . Years of education: middle sch  . Highest education level: Not on file  Occupational History  . Occupation: retired    Comment: n/a  Social Needs  . Financial resource strain: Not on file  . Food insecurity:    Worry: Not on file    Inability: Not on file  . Transportation needs:    Medical: Not on file    Non-medical: Not on file  Tobacco Use  . Smoking status: Former Smoker    Packs/day: 1.50    Years: 30.00    Pack years: 45.00    Types: E-cigarettes, Cigarettes    Last attempt to quit: 2013    Years since quitting: 6.9  . Smokeless tobacco: Never Used  Substance and Sexual Activity  . Alcohol use: No    Alcohol/week: 0.0 standard drinks  . Drug use: No  . Sexual activity: Never  Lifestyle  . Physical activity:    Days per week: Not on file    Minutes per session: Not on file  . Stress: Not on file  Relationships  . Social connections:    Talks on phone: Not on file    Gets together: Not on file    Attends religious service: Not on file    Active member of club or organization: Not on file    Attends meetings of clubs or organizations: Not on file    Relationship status: Not on file  . Intimate partner violence:    Fear of current or ex partner: Not on file    Emotionally abused: Not on file    Physically abused: Not on file    Forced sexual activity: Not on file  Other Topics Concern  . Not on file  Social History Narrative    ** Merged History Encounter **       Patient lives at home with daughter. Caffeine Use: 4 cups daily    Allergies  Allergen Reactions  .  Ampicillin Anaphylaxis, Nausea Only and Swelling    SEVERE HEADACHE MUSCLE CRAMPS ANGIOEDEMA THRUSH PATIENT HAS HAD A PCN REACTION WITH IMMEDIATE RASH, FACIAL/TONGUE/THROAT SWELLING, SOB, OR LIGHTHEADEDNESS WITH HYPOTENSION:  #  #  YES  #  #  Has patient had a PCN reaction causing severe rash involving mucus membranes or skin necrosis: No Has patient had a PCN reaction that required hospitalization:No Has patient had a PCN reaction occurring within the last 10 years: No.  . Ampicillin-Sulbactam Sodium Anaphylaxis and Other (See Comments)    SEVERE HEADACHE MUSCLE CRAMPS ANGIOEDEMA THRUSH PATIENT HAS HAD A PCN REACTION WITH IMMEDIATE RASH, FACIAL/TONGUE/THROAT SWELLING, SOB, OR LIGHTHEADEDNESS WITH HYPOTENSION: # # YES # # Has patient had a PCN reaction causing severe rash involving mucus membranes or skin necrosis: No Has patient had a PCN reaction that required hospitalization:No Has patient had a PCN reaction occurring within the last 10 years: No   . Ambien [Zolpidem Tartrate] Nausea And Vomiting and Other (See Comments)    HALLUCINATIONS SWEATING  . Flexeril [Cyclobenzaprine] Other (See Comments)    EXTRAPYRAMIDAL MOVEMENT INVOLUNTARY MUSCLE JERKING  . Tape Itching, Rash and Other (See Comments)    Paper tape only. Adhesive tape=itching/burning/rash  . Toradol [Ketorolac Tromethamine] Nausea Only and Other (See Comments)    HEADACHE  BACKACHE  . Sulfamethoxazole-Trimethoprim     UNSPECIFIED REACTION   . Zolpidem     Hallucinations  . Cephalexin Rash and Other (See Comments)    HEADACHES  . Tramadol Rash and Other (See Comments)    HEADACHE     Family History  Problem Relation Age of Onset  . Heart failure Mother   . Heart attack Father     Prior to Admission medications   Medication Sig Start Date End Date Taking?  Authorizing Provider  albuterol (ACCUNEB) 1.25 MG/3ML nebulizer solution Take 1 ampule by nebulization every 6 (six) hours as needed for wheezing.   Yes [provider]  albuterol (PROVENTIL HFA;VENTOLIN HFA) 108 (90 BASE) MCG/ACT inhaler Inhale 2 puffs into the lungs every 6 (six) hours as needed for wheezing or shortness of breath.    Yes [provider]  alendronate (FOSAMAX) 70 MG tablet Take 70 mg by mouth every Monday. Take with a full glass of water on an empty stomach.   Yes [provider]  ALPRAZolam Duanne Moron) 1 MG tablet Take 1 mg by mouth at bedtime as needed for sleep.   Yes [provider]  amLODipine (NORVASC) 5 MG tablet Take 5 mg by mouth daily.  04/06/13  Yes [provider]  Calcium 500-100 MG-UNIT CHEW Chew 1 tablet by mouth daily.   Yes [provider]  Calcium-Phosphorus-Vitamin D (CALCIUM/D3 ADULT GUMMIES PO) Take 1 tablet by mouth daily.   Yes [provider]  Cholecalciferol (VITAMIN D HIGH POTENCY PO) Take 5,000 Units by mouth daily.   Yes [provider]  citalopram (CELEXA) 20 MG tablet Take 20 mg by mouth daily.  02/27/17  Yes [provider]  docusate sodium (COLACE) 100 MG capsule Take 200 mg by mouth 2 (two) times daily as needed for moderate constipation.    Yes [provider]  esomeprazole (NEXIUM) 40 MG capsule Take 40 mg by mouth daily.    Yes [provider]  Ferrous Sulfate (IRON) 325 (65 Fe) MG TABS Take 1 tablet (325 mg total) by mouth 2 (two) times daily. Patient taking differently: Take 1 tablet by mouth 2 (two) times a week.  10/01/17  Yes Prudencio Burly III, PA-C  fluticasone (FLOVENT HFA) 220 MCG/ACT inhaler Inhale 1 puff into the lungs 2 (two) times daily.   Yes [provider]  guaiFENesin (MUCINEX) 600 MG 12 hr tablet Take 600 mg by mouth 2 (two) times daily as needed for cough.   Yes [provider]  Javier Docker Oil 500 MG CAPS Take 1  capsule by mouth daily.   Yes [provider]  linaclotide (LINZESS) 290 MCG CAPS capsule Take 290 mcg by mouth daily.   Yes [provider]  lovastatin (MEVACOR) 10 MG tablet Take 10 mg by mouth daily. 09/22/14  Yes [provider]  meloxicam (MOBIC) 15 MG tablet Take 15 mg by mouth daily.  05/23/16  Yes [provider]  Menthol, Topical Analgesic, (BIOFREEZE EX) Apply 1 application topically daily as needed (muscle pain).   Yes [provider]  ondansetron (ZOFRAN ODT) 4 MG disintegrating tablet Take 1 tablet (4 mg total) by mouth every 8 (eight) hours as needed for nausea or vomiting. 02/26/18  Yes Eula Listen, MD  oxyCODONE-acetaminophen (PERCOCET) 5-325 MG tablet Take 1 tablet by mouth every 6 (six) hours as needed for severe pain. 04/17/18  Yes Letitia Neri L, PA-C  polyethylene glycol (MIRALAX / GLYCOLAX) packet Take 17 g by mouth daily as needed (for constipation.).    Yes [provider]  Potassium 95 MG TABS Take 1 tablet by mouth daily.   Yes [provider]  pregabalin (LYRICA) 75 MG capsule Take 1 capsule (75 mg total) by mouth 2 (two) times daily. 04/06/18 10/03/18 Yes Milinda Pointer, MD  traZODone (DESYREL) 100 MG tablet Take 100 mg by mouth at bedtime.    Yes [provider]  vitamin B-12 (CYANOCOBALAMIN) 500 MCG tablet Take 500 mcg by mouth daily.   Yes [provider]  diclofenac sodium (VOLTAREN) 1 % GEL Apply 2 g topically 4 (four) times daily. Patient not taking: Reported on 04/20/2018 04/13/18   Laban Emperor, PA-C  lidocaine (LIDODERM) 5 % Place 1 patch onto the skin every 12 (twelve) hours. Remove & Discard patch within 12 hours or as directed by MD Patient not taking: Reported on 04/20/2018 03/17/18 03/17/19  Sable Feil, PA-C    Physical Exam: Vitals:   04/20/18 1037 04/20/18 1038 04/20/18 1100 04/20/18 1215  BP: (!) 119/54  113/60 (!) 113/48  Pulse: 81  70 71  Resp:    19 (!) 23  Temp: 99.2 F (37.3 C)     TempSrc: Oral     SpO2: (!) 88%  96% 96%  Weight:  81.7 kg    Height:  5\' 2"  (1.575 m)       . General: Appears calm and comfortable and is in NAD. Oriented to place, self. Thinks its October, Tuesday. 2019. Not sure about day of date. . Eyes:  PERRL, EOMI, normal lids, iris . ENT:  grossly normal hearing, lips & tongue, dry mm . Neck:  supple, no lymphadenopathy . Cardiovascular:  nL S1, S2, normal rate, reg rhythm, no murmur. Marland Kitchen Respiratory:  Diminished breath sounds bilaterally, fine crackles at bases, no wheezes/rhonchi . Abdomen:  soft, NT, ND, NABS . Back:   grossly normal alignment . Skin:  no rash or lesions seen on limited exam . Musculoskeletal:  grossly normal tone BUE/BLE, good ROM, no bony abnormality or obvious joint deformity . Lower extremities:  No LE edema.  Limited foot exam with no ulcerations.  2+ distal pulses. Marland Kitchen Psychiatric:  grossly  normal mood and affect, speech fluent and appropriate, AOx3 . Neurologic:  CN 2-12 grossly intact, moves all extremities in coordinated fashion, sensation intact, Patellar DTRs 2+ and symmetric    Radiological Exams on Admission: Dg Chest 2 View  Result Date: 04/20/2018 CLINICAL DATA:  Chest pain, shortness of Breath EXAM: CHEST - 2 VIEW COMPARISON:  11/19/2016 FINDINGS: Cardiomegaly. Patchy bilateral airspace opacities, right greater than left. This is most confluent in the right lung base. Cannot exclude pneumonia. No effusions or acute bony abnormality. IMPRESSION: Cardiomegaly. Patchy bilateral opacities, most pronounced in the right lower lobe concerning for pneumonia. Electronically Signed   By: Rolm Baptise M.D.   On: 04/20/2018 11:28    EKG: Independently reviewed.  Rate 73 Sinus rhythm Left ventricular hypertrophy Inferior infarct, old   Labs on Admission: I have personally reviewed the available labs and imaging studies at the time of the admission.  Pertinent labs: Sodium 139  potassium 3.6 chloride 109 CO2 24 glucose 95 BUN 16 creatinine 1.27, baseline calcium 8.6 Albumin 2.6 total protein 5.8 Troponin less than 0.03 WBC 14.6 hemoglobin 12.4 platelets 133 U/A large leuks, >50 WBC   Assessment/Plan Principal Problem:   Respiratory failure with hypoxia (HCC) Active Problems:   Hypercholesteremia   GERD (gastroesophageal reflux disease)   Anxiety   CAFL (chronic airflow limitation) (HCC)   Benign essential HTN   Depression, major, in partial remission (HCC)   UTI (urinary tract infection)   Long term current use of opiate analgesic   Chronic pain syndrome   Chronic hip pain (Right)   CKD (chronic kidney disease) stage 3, GFR 30-59 ml/min (HCC)   CAP (community acquired pneumonia)   Acute lower UTI    Acute hypoxic respiratory failure: She has a history of COPD, previous smoker.  Given her fever and chest x-ray findings and leukocytosis she likely has community-acquired pneumonia.  She is been given 1 dose of IV Levaquin 750 mg in the ED. -Continue Levaquin for now.  This will also likely treat any underlying UTI -Continue supplemental oxygen, wean as tolerated.  Obtain ambulatory O2 sats tomorrow.  Pulmonary toilet. -Continue albuterol nebulizers every 6 hours as needed, MDI 2 puffs every 6 hours as needed  UTI -cont LVQ for CAP, should also cover UTI -f/u UCx, tailor abx as needed  Osteoarthritis, chronic right hip pain -Continue Percocet p.o. every 6 hours as needed -New [place] 75 mg twice daily -Continue meloxicam, Lidoderm patch, Biofreeze -Appreciate input by Dr. Percell Miller regarding management of her pain.  Hypertension: -Continue Norvasc  Chronic kidney disease stage III, creatinine at baseline -Renally dose meds -Avoid nephrotoxins -Patient appears mildly volume depleted, start normal saline 100 cc/h -BMP tomorrow  Hyperlipidemia -cont Statin  GERD -Continue PPI  Depression, anxiety -Continue Celexa 20 mg daily, trazodone 100 mg  at bedtime, Xanax 1 mg at bedtime as needed    DVT prophylaxis: Lovenox Code Status:  Full - confirmed with patient/family Family Communication: daughter at bedside  Disposition Plan:  Home once clinically improved Consults called: none  Admission status: Admit - It is my clinical opinion that admission to INPATIENT is reasonable and necessary because of the expectation that this patient will require hospital care that crosses at least 2 midnights to treat this condition based on the medical complexity of the problems presented.  Given the aforementioned information, the predictability of an adverse outcome is felt to be significant.     Janora Norlander MD [facility]  If note is complete, please  contact covering daytime or nighttime physician. [email or web address] Password [facility]  04/20/2018, 12:51 PM

## 2018-04-20 NOTE — ED Triage Notes (Signed)
Per [facility] pt had surgery on her right hip in May. Pt has had intermittent infections and episodes of pain since then. Pt is complaining of right hip pain today and unable to put weight on her right leg today. Pt took 800mg  of Tylenol today for a reported fever at home and oxycodone for pain. Pt has also had some periods of confusion for EMS. GCS of 14

## 2018-04-20 NOTE — ED Provider Notes (Signed)
[facility] EMERGENCY DEPARTMENT Provider Note   CSN: [id number] Arrival date & time: 04/20/18  1028     History   Chief Complaint Chief Complaint  Patient presents with  . Hip Pain    HPI [name] is a 77 y.o. female.   77 y.o female with a PMH of COPD, Stroke, Anemia presents to the ED via EMS with a chief complaint of right hip pain x 2 days. Patient's daughter are at the bedside reports patient has been altered for the past 2 days. Patient also reporting rip hip pain, she currently has a rip hip bone stimulator that was placed in May by Dr. Percell Miller.  At the bedside reports that patient is usually ambulatory with her walker but has been less active these past couple days.  To medical records patient has had multiple visits to the ED due to her hip pain, she currently takes hydrocodone for pain relief.  Reports she noticed her a little more altered this morning.  Also reports taken her temperature a T-max of 102.3.  Daughter has been giving patient Tylenol for this fever.  Patient was standing at 88% on arrival via EMS she was laced on nasal cannula at 2 L now satting at 96%.  Also reports her mother has a foul smell to her urine along with dark in color.  Patient denies any dysuria, abdominal pain or other complaints.     Past Medical History:  Diagnosis Date  . Acute postoperative pain 02/03/2018  . Anemia   . B12 deficiency   . Bacteremia 10/07/2014  . Bladder incontinence   . Blind left eye   . Cancer ([place])    skin cancer   . Cataract   . Cervical spine fracture ([place])   . Chest pain 07/29/2013  . Compression fracture    C7,  upper T spine -compression fx  . COPD (chronic obstructive pulmonary disease) ([place])   . DDD (degenerative disc disease), lumbar   . Depression    major  . Diffuse myofascial pain syndrome 03/24/2015  . Dyspnea    at times- when activty  . Fever 10/05/2014  . GERD (gastroesophageal reflux disease)   . Hiatal hernia   .  Hyperlipidemia   . Hypertension   . 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 Macular degeneration   . Osteoarthritis   . Osteoporosis   . Pneumonia    years ago  . Reactive airway disease   . Rupture of bowel ([place])   . Sepsis ([place]) 10/08/2014  . Stroke [facility])    "mini stroke"- balance and memory issue  . Stroke ([place])   . Wrist pain, acute 09/10/2012    Patient Active Problem List   Diagnosis Date Noted  . Chronic lower extremity pain (Right) 04/07/2018  . Abnormal MRI, lumbar spine 04/07/2018  . Lumbar facet hypertrophy (Multilevel) (Bilateral) 04/07/2018  . Lumbar foraminal stenosis (L3-4, L4-5) (Bilateral) 04/07/2018  . Annular tear of lumbar disc (L3-4, L4-5) 04/07/2018  . Acute right hip pain 02/26/2018  . Closed hip fracture, sequela (Right) 02/03/2018  . It band syndrome, right 12/18/2017  . History of hip replacement (Right) 12/18/2017  . Ileus ([place]) 10/27/2017  . Primary osteoarthritis of hip 09/30/2017  . History of small bowel obstruction 09/10/2017  . 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 SBO (small bowel obstruction) ([place]) 08/24/2017  . Abnormal x-ray of pelvis 08/13/2017  . Other specified dorsopathies, sacral and sacrococcygeal region 08/04/2017  .  Spondylosis without myelopathy or radiculopathy, lumbosacral region 07/17/2017  . Non-traumatic compression fracture of T3 thoracic vertebra, sequela 07/02/2017  . High risk medication use 07/02/2017  . At high risk for falls 07/02/2017  . Chronic sacroiliac joint pain (Right) 07/02/2017  . CKD (chronic kidney disease) stage 3, GFR 30-59 ml/min (HCC) 04/23/2017  . Chronic knee pain s/p total knee replacement (TKR) (Right) 04/02/2017  . DDD (degenerative disc disease), lumbar 03/05/2017  . Chronic knee pain (Bilateral) (R>L) 03/05/2017  . Osteoarthritis 03/05/2017  . Chronic musculoskeletal pain 03/05/2017  . Neurogenic pain 03/05/2017  . Chronic myofascial pain 02/12/2017  .  Lumbar facet syndrome (Bilateral) (L>R) 02/12/2017  . Long term prescription benzodiazepine use 02/12/2017  . Opiate use 02/12/2017  . Disorder of skeletal system 02/12/2017  . Pharmacologic therapy 02/12/2017  . Problems influencing health status 02/12/2017  . 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 NSAID long-term use 02/12/2017  . DDD (degenerative disc disease), cervical 11/11/2016  . Lumbar facet arthropathy (Bilateral) 11/11/2016  . 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 CAFL (chronic airflow limitation) ([place]) 11/07/2016  . Depression, major, in partial remission ([place]) 11/07/2016  . 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 Bed wetting 11/07/2016  . Long term current use of opiate analgesic 11/07/2016  . Long term prescription opiate use 11/07/2016  . Chronic pain syndrome 11/07/2016  . Chronic neck pain (Secondary Area of Pain) (Bilateral) (L>R) 11/07/2016  . Chronic hip pain (Right) 11/07/2016  . Rib pain 11/07/2016  . Age-related osteoporosis with current pathological fracture with routine healing 09/20/2016  . History of stroke 09/20/2016  . Major depression in remission ([place]) 09/20/2016  . Intertrochanteric fracture of femur, sequela (Right) 09/13/2016  . Fall   . Chronic shoulder pain (Bilateral) 08/12/2016  . Hx of total knee replacement (Bilateral) 04/04/2016  . Chronic shoulder pain (Left) 02/28/2016  . Hoarseness 01/12/2016  . Pharyngoesophageal dysphagia 01/12/2016  . Chronic low back pain [facility]) (Bilateral) (L>R) 10/26/2015  . 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 UTI (urinary tract infection) 06/25/2015  . Syncope, non cardiac 05/22/2015  . Leukopenia 10/07/2014  . Thrombocytopenia ([place]) 10/07/2014  . Hypoxia 10/05/2014  . Anxiety 06/15/2014  . Apoplectic vertigo 06/15/2014  . Duodenogastric reflux 06/15/2014  .  Benign essential HTN 06/15/2014  . Infection of the upper respiratory tract 06/15/2014  . Hypercholesteremia 07/30/2013  . HTN (hypertension) 07/30/2013  . GERD (gastroesophageal reflux disease) 07/30/2013  . Dizziness and giddiness 06/18/2013  . Memory loss 05/03/2013  . Chronic knee pain (Primary Area of Pain) (Right) 09/10/2012    Past Surgical History:  Procedure Laterality Date  . ABDOMINAL HYSTERECTOMY    . ABDOMINAL SURGERY    . APPENDECTOMY    . BLADDER SURGERY    . CHOLECYSTECTOMY    . COLON SURGERY    . COLOSTOMY REVERSAL    . EYE SURGERY     cataracts  . HIP SURGERY Right 08/2016  . INTRAMEDULLARY (IM) NAIL INTERTROCHANTERIC Right 09/13/2016   Procedure: INTRAMEDULLARY (IM) NAIL INTERTROCHANTRIC RIGHT;  Surgeon: Leandrew Koyanagi, MD;  Location: [facility];  Service: Orthopedics;  Laterality: Right;  . JOINT REPLACEMENT Bilateral   . KNEE SURGERY    . OSTOMY    . RECTOCELE REPAIR    . TOE SURGERY Right    3rd  . TOTAL HIP ARTHROPLASTY Right 09/30/2017   Procedure: RIGHT HIP IM NAIL REMOVAL  WITH CONVERSION TO TOTAL HIP ARTHOPLASTY, anterior approach;  Surgeon: Renette Butters, MD;  Location: [facility];  Service: Orthopedics;  Laterality: Right;     OB History    Gravida  0   Para  0   Term  0   Preterm  0   AB  0   Living        SAB  0   TAB  0   Ectopic  0   Multiple      Live Births               Home Medications    Prior to Admission medications   Medication Sig Start Date End Date Taking? Authorizing Provider  albuterol (ACCUNEB) 1.25 MG/3ML nebulizer solution Take 1 ampule by nebulization every 6 (six) hours as needed for wheezing.   Yes [provider]  albuterol (PROVENTIL HFA;VENTOLIN HFA) 108 (90 BASE) MCG/ACT inhaler Inhale 2 puffs into the lungs every 6 (six) hours as needed for wheezing or shortness of breath.    Yes [provider]  alendronate (FOSAMAX) 70 MG tablet Take 70 mg by mouth every Monday. Take with a full  glass of water on an empty stomach.   Yes [provider]  ALPRAZolam Duanne Moron) 1 MG tablet Take 1 mg by mouth at bedtime as needed for sleep.   Yes [provider]  amLODipine (NORVASC) 5 MG tablet Take 5 mg by mouth daily.  04/06/13  Yes [provider]  Calcium 500-100 MG-UNIT CHEW Chew 1 tablet by mouth daily.   Yes [provider]  Calcium-Phosphorus-Vitamin D (CALCIUM/D3 ADULT GUMMIES PO) Take 1 tablet by mouth daily.   Yes [provider]  Cholecalciferol (VITAMIN D HIGH POTENCY PO) Take 5,000 Units by mouth daily.   Yes [provider]  citalopram (CELEXA) 20 MG tablet Take 20 mg by mouth daily.  02/27/17  Yes [provider]  docusate sodium (COLACE) 100 MG capsule Take 200 mg by mouth 2 (two) times daily as needed for moderate constipation.    Yes [provider]  esomeprazole (NEXIUM) 40 MG capsule Take 40 mg by mouth daily.    Yes [provider]  Ferrous Sulfate (IRON) 325 (65 Fe) MG TABS Take 1 tablet (325 mg total) by mouth 2 (two) times daily. Patient taking differently: Take 1 tablet by mouth 2 (two) times a week.  10/01/17  Yes Martensen, Charna Elizabeth III, PA-C  fluticasone (FLOVENT HFA) 220 MCG/ACT inhaler Inhale 1 puff into the lungs 2 (two) times daily.   Yes [provider]  guaiFENesin (MUCINEX) 600 MG 12 hr tablet Take 600 mg by mouth 2 (two) times daily as needed for cough.   Yes [provider]  Javier Docker Oil 500 MG CAPS Take 1 capsule by mouth daily.   Yes [provider]  linaclotide (LINZESS) 290 MCG CAPS capsule Take 290 mcg by mouth daily.   Yes [provider]  lovastatin (MEVACOR) 10 MG tablet Take 10 mg by mouth daily. 09/22/14  Yes [provider]  meloxicam (MOBIC) 15 MG tablet Take 15 mg by mouth daily.  05/23/16  Yes [provider]  Menthol, Topical Analgesic, (BIOFREEZE EX) Apply 1 application topically daily as needed (muscle pain).    Yes [provider]  ondansetron (ZOFRAN ODT) 4 MG disintegrating tablet Take 1 tablet (4 mg total) by mouth every 8 (eight) hours as needed for nausea or vomiting. 02/26/18  Yes Eula Listen, MD  oxyCODONE-acetaminophen (PERCOCET) 5-325 MG tablet Take 1 tablet by mouth every 6 (six) hours as needed for severe pain. 04/17/18  Yes Letitia Neri L, PA-C  polyethylene glycol (MIRALAX / GLYCOLAX) packet Take 17 g by mouth daily as needed (for constipation.).    Yes [provider]  Potassium 95 MG TABS Take 1 tablet by mouth daily.   Yes [provider]  pregabalin (LYRICA) 75 MG capsule Take 1 capsule (75 mg total) by mouth 2 (two) times daily. 04/06/18 10/03/18 Yes Milinda Pointer, MD  traZODone (DESYREL) 100 MG tablet Take 100 mg by mouth at bedtime.    Yes [provider]  vitamin B-12 (CYANOCOBALAMIN) 500 MCG tablet Take 500 mcg by mouth daily.   Yes [provider]  diclofenac sodium (VOLTAREN) 1 % GEL Apply 2 g topically 4 (four) times daily. Patient not taking: Reported on 04/20/2018 04/13/18   Laban Emperor, PA-C  lidocaine (LIDODERM) 5 % Place 1 patch onto the skin every 12 (twelve) hours. Remove & Discard patch within 12 hours or as directed by MD Patient not taking: Reported on 04/20/2018 03/17/18 03/17/19  Sable Feil, PA-C    Family History Family History  Problem Relation Age of Onset  . Heart failure Mother   . Heart attack Father     Social History Social History   Tobacco Use  . Smoking status: Former Smoker    Packs/day: 1.50    Years: 30.00    Pack years: 45.00    Types: E-cigarettes, Cigarettes    Last attempt to quit: 2013    Years since quitting: 6.9  . Smokeless tobacco: Never Used  Substance Use Topics  . Alcohol use: No    Alcohol/week: 0.0 standard drinks  . Drug use: No     Allergies   Ampicillin; Ampicillin-sulbactam sodium; Ambien [zolpidem tartrate]; Flexeril [cyclobenzaprine]; Tape;  Toradol [ketorolac tromethamine]; Sulfamethoxazole-trimethoprim; Zolpidem; Cephalexin; and Tramadol   Review of Systems Review of Systems  Constitutional: Negative for chills and fever.  HENT: Negative for ear pain and sore throat.   Eyes: Negative for pain and visual disturbance.  Respiratory: Positive for cough and shortness of breath.   Cardiovascular: Negative for chest pain and palpitations.  Gastrointestinal: Negative for abdominal pain and vomiting.  Genitourinary: Negative for dysuria and hematuria.  Musculoskeletal: Positive for arthralgias. Negative for back pain.  Skin: Negative for color change and rash.  Neurological: Negative for seizures and syncope.  All other systems reviewed and are negative.    Physical Exam Updated Vital Signs BP (!) 113/48   Pulse 71   Temp 99.2 F (37.3 C) (Oral)   Resp (!) 23   Ht 5\' 2"  (1.575 m)   Wt 81.7 kg   SpO2 96%   BMI 32.92 kg/m   Physical Exam  Constitutional: She is oriented to person, place, and time. She appears well-developed and well-nourished.  HENT:  Head: Normocephalic and atraumatic.  Mouth/Throat: Oropharynx is clear and moist. No oropharyngeal exudate.  Eyes: Pupils are equal, round, and reactive to light.  Neck: Normal range of motion.  Cardiovascular: Regular rhythm and normal heart sounds.  Pulmonary/Chest: Effort normal and breath sounds normal. No respiratory distress.  Abdominal: Soft. Bowel sounds are normal. She exhibits no distension. There is no tenderness.  Musculoskeletal: She exhibits no tenderness or deformity.       Right lower leg: She exhibits no edema.       Left lower leg: She exhibits no edema.  Neurological: She is alert  and oriented to person, place, and time.  Alert, oriented, thought content appropriate. Speech fluent without evidence of aphasia. Able to follow 2 step commands without difficulty.  Cranial Nerves:  II:  Peripheral visual fields grossly normal, pupils, round, reactive to  light III,IV, VI: ptosis not present, extra-ocular motions intact bilaterally  V,VII: smile symmetric, facial light touch sensation equal VIII: hearing grossly normal bilaterally  IX,X: midline uvula rise  XI: bilateral shoulder shrug equal and strong XII: midline tongue extension  Motor:  5/5 in upper and lower extremities bilaterally including strong and equal grip strength and dorsiflexion/plantar flexion Sensory: light touch normal in all extremities.  Cerebellar: normal finger-to-nose with bilateral upper extremities  Skin: Skin is warm and dry. She is not diaphoretic.  Psychiatric: She has a normal mood and affect.  Nursing note and vitals reviewed.    ED Treatments / Results  Labs (all labs ordered are listed, but only abnormal results are displayed) Labs Reviewed  CBC WITH DIFFERENTIAL/PLATELET - Abnormal; Notable for the following components:      Result Value   WBC 14.6 (*)    Platelets 133 (*)    Neutro Abs 12.2 (*)    Abs Immature Granulocytes 0.10 (*)    All other components within normal limits  COMPREHENSIVE METABOLIC PANEL - Abnormal; Notable for the following components:   Creatinine, Ser 1.27 (*)    Calcium 8.6 (*)    Total Protein 5.8 (*)    Albumin 2.6 (*)    ALT 51 (*)    GFR calc non Af Amer 40 (*)    GFR calc Af Amer 46 (*)    All other components within normal limits  TROPONIN I  URINALYSIS, ROUTINE W REFLEX MICROSCOPIC    EKG None  Radiology Dg Chest 2 View  Result Date: 04/20/2018 CLINICAL DATA:  Chest pain, shortness of Breath EXAM: CHEST - 2 VIEW COMPARISON:  11/19/2016 FINDINGS: Cardiomegaly. Patchy bilateral airspace opacities, right greater than left. This is most confluent in the right lung base. Cannot exclude pneumonia. No effusions or acute bony abnormality. IMPRESSION: Cardiomegaly. Patchy bilateral opacities, most pronounced in the right lower lobe concerning for pneumonia. Electronically Signed   By: Rolm Baptise M.D.   On:  04/20/2018 11:28    Procedures Procedures (including critical care time)  Medications Ordered in ED Medications  levofloxacin (LEVAQUIN) IVPB 750 mg (has no administration in time range)     Initial Impression / Assessment and Plan / ED Course  I have reviewed the triage vital signs and the nursing notes.  Pertinent labs & imaging results that were available during my care of the patient were reviewed by me and considered in my medical decision making (see chart for details).    Patient presents with altered mental status per daughter along with right hip pain.  During evaluation patient is AO x3.  Also reports the patient was treated recently with multiple antibiotics for UTI.  Patient denies any dysuria at this time.CBC revealed slight leukocytosis at 14.6, CMP showed slight increase 1.27 patient has less ambulatory according to daughter.  Richardson Dopp was negative, DG chest showed Cardiomegaly. Patchy bilateral opacities, most pronounced in the  right lower lobe concerning for pneumonia.   Does report that patient has been having worse right hip pain, however on exam patient has full range of motion the hip, no warmth or redness to the area low suspicion for septic joint.  Not obtain any imaging as patient had a hip x-ray 3  days ago which showed no acute abnormality and patient denies any recent trauma or falls. 12:36 PM Spoke to hospitalist Dr. Quentin Ore who will see patient in the ED along with admit for Community acquired pneumonia.   Final Clinical Impressions(s) / ED Diagnoses   Final diagnoses:  Community acquired pneumonia, unspecified laterality  AKI (acute kidney injury) ([place])  Pain of right hip joint    ED Discharge Orders    None       Janeece Fitting, Hershal Coria 04/20/18 1237    Jola Schmidt, MD 04/20/18 1706

## 2018-04-21 ENCOUNTER — Encounter (HOSPITAL_COMMUNITY): Payer: Self-pay | Admitting: General Practice

## 2018-04-21 DIAGNOSIS — N183 Chronic kidney disease, stage 3 (moderate): Secondary | ICD-10-CM

## 2018-04-21 DIAGNOSIS — N390 Urinary tract infection, site not specified: Secondary | ICD-10-CM

## 2018-04-21 DIAGNOSIS — M25551 Pain in right hip: Secondary | ICD-10-CM

## 2018-04-21 DIAGNOSIS — G8929 Other chronic pain: Secondary | ICD-10-CM

## 2018-04-21 DIAGNOSIS — G9341 Metabolic encephalopathy: Secondary | ICD-10-CM

## 2018-04-21 DIAGNOSIS — J189 Pneumonia, unspecified organism: Principal | ICD-10-CM

## 2018-04-21 LAB — GLUCOSE, CAPILLARY: Glucose-Capillary: 177 mg/dL — ABNORMAL HIGH (ref 70–99)

## 2018-04-21 LAB — BASIC METABOLIC PANEL
Anion gap: 5 (ref 5–15)
BUN: 15 mg/dL (ref 8–23)
CO2: 24 mmol/L (ref 22–32)
Calcium: 8.8 mg/dL — ABNORMAL LOW (ref 8.9–10.3)
Chloride: 111 mmol/L (ref 98–111)
Creatinine, Ser: 1.25 mg/dL — ABNORMAL HIGH (ref 0.44–1.00)
GFR calc Af Amer: 47 mL/min — ABNORMAL LOW (ref >60)
GFR calc non Af Amer: 41 mL/min — ABNORMAL LOW (ref >60)
Glucose, Bld: 84 mg/dL (ref 70–99)
Potassium: 4.6 mmol/L (ref 3.5–5.1)
Sodium: 140 mmol/L (ref 135–145)

## 2018-04-21 LAB — CBC
HCT: 40.4 % (ref 36.0–46.0)
Hemoglobin: 12 g/dL (ref 12.0–15.0)
MCH: 29.5 pg (ref 26.0–34.0)
MCHC: 29.7 g/dL — ABNORMAL LOW (ref 30.0–36.0)
MCV: 99.3 fL (ref 80.0–100.0)
Platelets: 137 10*3/uL — ABNORMAL LOW (ref 150–400)
RBC: 4.07 MIL/uL (ref 3.87–5.11)
RDW: 14.6 % (ref 11.5–15.5)
WBC: 13.7 10*3/uL — ABNORMAL HIGH (ref 4.0–10.5)
nRBC: 0 % (ref 0.0–0.2)

## 2018-04-21 LAB — HIV ANTIBODY (ROUTINE TESTING W REFLEX): HIV Screen 4th Generation wRfx: NONREACTIVE

## 2018-04-21 MED ORDER — FERROUS SULFATE 325 (65 FE) MG PO TABS
325.0000 mg | ORAL_TABLET | ORAL | Status: DC
  Administered 2018-04-22 – 2018-04-24 (×3): 325 mg via ORAL
  Filled 2018-04-21 (×5): qty 1

## 2018-04-21 MED ORDER — LEVOFLOXACIN IN D5W 750 MG/150ML IV SOLN
750.0000 mg | INTRAVENOUS | Status: DC
  Administered 2018-04-22: 750 mg via INTRAVENOUS
  Filled 2018-04-21 (×3): qty 150

## 2018-04-21 MED ORDER — POLYETHYLENE GLYCOL 3350 17 G PO PACK
17.0000 g | PACK | Freq: Every day | ORAL | Status: DC
  Administered 2018-04-22 – 2018-04-25 (×4): 17 g via ORAL
  Filled 2018-04-21 (×4): qty 1

## 2018-04-21 MED ORDER — SENNOSIDES-DOCUSATE SODIUM 8.6-50 MG PO TABS
1.0000 | ORAL_TABLET | Freq: Two times a day (BID) | ORAL | Status: DC
  Administered 2018-04-21 – 2018-04-23 (×4): 1 via ORAL
  Filled 2018-04-21 (×5): qty 1

## 2018-04-21 NOTE — Progress Notes (Signed)
PROGRESS NOTE  [name] ZSW:[id number] DOB: 1941/05/23 DOA: 04/20/2018 PCP: Alvester Chou, NP  Brief summary:  Patient presented to the emergency room due to complaint of right hip pain and confusion, found to have hypoxia Chest x-ray concerns for pneumonia She is started on Levaquin and IV hydration   HPI/Recap of past 24 hours:  Pleasant with slight confusion but easily redirected She denies pain, She  does has intermittent dry cough No fever, no edema  Assessment/Plan: Principal Problem:   Respiratory failure with hypoxia ([place]) Active Problems:   Hypercholesteremia   GERD (gastroesophageal reflux disease)   Anxiety   CAFL (chronic airflow limitation) (HCC)   Benign essential HTN   Depression, major, in partial remission (HCC)   UTI (urinary tract infection)   Long term current use of opiate analgesic   Chronic pain syndrome   Chronic hip pain (Right)   CKD (chronic kidney disease) stage 3, GFR 30-59 ml/min (HCC)   CAP (community acquired pneumonia)   Acute lower UTI  Acute hypoxic respiratory failure/ community acquired pneumonia/acute metabolic encephalopathy -Daughter reports patient has fever of 103 at home , she has no fever since in the hospital, she does has leukocytosis, wbc 14.6 on presention -Blood culture negative, MRSA screening negative, urine strep pneumo antigen negative -cxr "Cardiomegaly. Patchy bilateral opacities, most pronounced in the right lower lobe concerning for pneumonia." -She does has history of COPD but no wheezing on exam, continue IV Levaquin for now  Possible UTI -Urine culture was not obtained prior to starting antibiotics  Hypertension: BP borderline low, hold home BP meds Norvasc  Chronic kidney disease stage III, creatinine at baseline -Renally dose meds -Avoid nephrotoxins -Patient appears mildly volume depleted, continue normal saline 100 cc/h -BMP tomorrow  Osteoarthritis, chronic right hip pain -Continue Percocet  p.o. every 6 hours as needed -New Lyrica 75 mg twice daily -Continue meloxicam, Lidoderm patch, Biofreeze -Appreciate input by Dr. Percell Miller regarding management of her pain.  Depression, anxiety -Continue Celexa 20 mg daily, trazodone 100 mg at bedtime, Xanax 1 mg at bedtime as needed  Code Status: full  Family Communication: patient   Disposition Plan: needs PT eval   Consultants:  none  Procedures:  none  Antibiotics:  levaquin   Objective: BP (!) 121/50 (BP Location: Right Arm)   Pulse 73   Temp 98.5 F (36.9 C) (Oral)   Resp 18   Ht 5\' 2"  (1.575 m)   Wt 81.7 kg   SpO2 94%   BMI 32.92 kg/m   Intake/Output Summary (Last 24 hours) at 04/21/2018 1135 Last data filed at 04/21/2018 1000 Gross per 24 hour  Intake 1347.91 ml  Output 1500 ml  Net -152.09 ml   Filed Weights   04/20/18 1038  Weight: 81.7 kg    Exam: Patient is examined daily including today on 04/21/2018, exams remain the same as of yesterday except that has changed    General:  Pleasantly confused, she states it is 2020, she is at [employer] long  Cardiovascular: RRR  Respiratory: diminished, mild scattered basilar crackles  Abdomen: Soft/ND/NT, positive BS  Musculoskeletal: No Edema  Neuro: alert, oriented , but slightly confused  Data Reviewed: Basic Metabolic Panel: Recent Labs  Lab 04/20/18 1042 04/21/18 0208  NA 139 140  K 3.6 4.6  CL 109 111  CO2 24 24  GLUCOSE 95 84  BUN 16 15  CREATININE 1.27* 1.25*  CALCIUM 8.6* 8.8*   Liver Function Tests: Recent Labs  Lab 04/20/18 1042  AST 24  ALT 51*  ALKPHOS 52  BILITOT 0.7  PROT 5.8*  ALBUMIN 2.6*   No results for input(s): LIPASE, AMYLASE in the last 168 hours. No results for input(s): AMMONIA in the last 168 hours. CBC: Recent Labs  Lab 04/20/18 1042 04/21/18 0208  WBC 14.6* 13.7*  NEUTROABS 12.2*  --   HGB 12.4 12.0  HCT 40.7 40.4  MCV 98.8 99.3  PLT 133* 137*   Cardiac Enzymes:   Recent Labs  Lab  04/20/18 1042  TROPONINI <0.03   BNP (last 3 results) No results for input(s): BNP in the last 8760 hours.  ProBNP (last 3 results) No results for input(s): PROBNP in the last 8760 hours.  CBG: No results for input(s): GLUCAP in the last 168 hours.  Recent Results (from the past 240 hour(s))  MRSA PCR Screening     Status: None   Collection Time: 04/20/18  4:14 PM  Result Value Ref Range Status   MRSA by PCR NEGATIVE NEGATIVE Final    Comment:        The GeneXpert MRSA Assay (FDA approved for NASAL specimens only), is one component of a comprehensive MRSA colonization surveillance program. It is not intended to diagnose MRSA infection nor to guide or monitor treatment for MRSA infections. Performed at [facility], [place] [address] [place], [place] [address]   Culture, blood (routine x 2)     Status: None (Preliminary result)   Collection Time: 04/20/18  9:45 PM  Result Value Ref Range Status   Specimen Description BLOOD LEFT HAND  Final   Special Requests   Final    BOTTLES DRAWN AEROBIC ONLY Blood Culture adequate volume   Culture   Final    NO GROWTH < 12 HOURS Performed at [name], [place] [address] [place], [place] [address]    Report Status PENDING  Incomplete  Culture, blood (routine x 2)     Status: None (Preliminary result)   Collection Time: 04/20/18  9:50 PM  Result Value Ref Range Status   Specimen Description BLOOD LEFT HAND  Final   Special Requests   Final    BOTTLES DRAWN AEROBIC ONLY Blood Culture adequate volume   Culture   Final    NO GROWTH < 12 HOURS Performed at [facility], [place] [address], [place], [place] [address]    Report Status PENDING  Incomplete     Studies: No results found.  Scheduled Meds: . amLODipine  5 mg Oral Daily  . budesonide  1 mg Nebulization BID  . calcium carbonate  500 mg of elemental calcium Oral Daily  . cholecalciferol  5,000 Units Oral Daily  . citalopram  20 mg Oral Daily  .  enoxaparin (LOVENOX) injection  40 mg Subcutaneous Q24H  . ferrous sulfate  325 mg Oral BID WC  .  HYDROmorphone (DILAUDID) injection  0.5 mg Intravenous Once  . lidocaine  1 patch Transdermal BID  . linaclotide  290 mcg Oral Daily  . meloxicam  15 mg Oral Daily  . pantoprazole  80 mg Oral Q1200  . pravastatin  10 mg Oral q1800  . pregabalin  75 mg Oral BID  . traZODone  100 mg Oral QHS  . vitamin B-12  500 mcg Oral Daily    Continuous Infusions: . sodium chloride 100 mL/hr at 04/21/18 0418  . [START ON 04/22/2018] levofloxacin (LEVAQUIN) IV       Time spent: 45mins I have personally  reviewed and interpreted on  04/21/2018 daily labs, imagings as discussed above under date review session and assessment and plans.  I reviewed all nursing notes, pharmacy notes,  vitals, pertinent old records  I have discussed plan of care as described above with RN , patient  on 04/21/2018   Florencia Reasons MD, PhD  [facility] Pager [phone]. If 7PM-7AM, please contact night-coverage at [email or web address], password [email or web address] 04/21/2018, 11:35 AM  LOS: 1 day

## 2018-04-22 ENCOUNTER — Ambulatory Visit: Payer: Medicare Other | Admitting: Family Medicine

## 2018-04-22 ENCOUNTER — Inpatient Hospital Stay (HOSPITAL_COMMUNITY): Payer: Medicare Other

## 2018-04-22 DIAGNOSIS — F419 Anxiety disorder, unspecified: Secondary | ICD-10-CM

## 2018-04-22 DIAGNOSIS — I10 Essential (primary) hypertension: Secondary | ICD-10-CM

## 2018-04-22 DIAGNOSIS — N179 Acute kidney failure, unspecified: Secondary | ICD-10-CM

## 2018-04-22 LAB — CBC WITH DIFFERENTIAL/PLATELET
Abs Immature Granulocytes: 0.06 10*3/uL (ref 0.00–0.07)
Basophils Absolute: 0 10*3/uL (ref 0.0–0.1)
Basophils Relative: 0 %
Eosinophils Absolute: 0.2 10*3/uL (ref 0.0–0.5)
Eosinophils Relative: 2 %
HCT: 36.3 % (ref 36.0–46.0)
Hemoglobin: 10.9 g/dL — ABNORMAL LOW (ref 12.0–15.0)
Immature Granulocytes: 1 %
Lymphocytes Relative: 7 %
Lymphs Abs: 0.6 10*3/uL — ABNORMAL LOW (ref 0.7–4.0)
MCH: 29.7 pg (ref 26.0–34.0)
MCHC: 30 g/dL (ref 30.0–36.0)
MCV: 98.9 fL (ref 80.0–100.0)
Monocytes Absolute: 0.8 10*3/uL (ref 0.1–1.0)
Monocytes Relative: 9 %
Neutro Abs: 7.1 10*3/uL (ref 1.7–7.7)
Neutrophils Relative %: 81 %
Platelets: 159 10*3/uL (ref 150–400)
RBC: 3.67 MIL/uL — ABNORMAL LOW (ref 3.87–5.11)
RDW: 14.7 % (ref 11.5–15.5)
WBC: 8.7 10*3/uL (ref 4.0–10.5)
nRBC: 0 % (ref 0.0–0.2)

## 2018-04-22 LAB — COMPREHENSIVE METABOLIC PANEL
ALT: 28 U/L (ref 0–44)
AST: 23 U/L (ref 15–41)
Albumin: 2.1 g/dL — ABNORMAL LOW (ref 3.5–5.0)
Alkaline Phosphatase: 54 U/L (ref 38–126)
Anion gap: 8 (ref 5–15)
BUN: 16 mg/dL (ref 8–23)
CO2: 21 mmol/L — ABNORMAL LOW (ref 22–32)
Calcium: 8.9 mg/dL (ref 8.9–10.3)
Chloride: 111 mmol/L (ref 98–111)
Creatinine, Ser: 1.11 mg/dL — ABNORMAL HIGH (ref 0.44–1.00)
GFR calc Af Amer: 56 mL/min — ABNORMAL LOW (ref >60)
GFR calc non Af Amer: 48 mL/min — ABNORMAL LOW (ref >60)
Glucose, Bld: 95 mg/dL (ref 70–99)
Potassium: 4.2 mmol/L (ref 3.5–5.1)
Sodium: 140 mmol/L (ref 135–145)
Total Bilirubin: 0.6 mg/dL (ref 0.3–1.2)
Total Protein: 5.8 g/dL — ABNORMAL LOW (ref 6.5–8.1)

## 2018-04-22 NOTE — Evaluation (Signed)
Physical Therapy Evaluation Patient Details Name: [name] MRN: [id number] DOB: January 10, 1941 Today's Date: 04/22/2018   History of Present Illness  77 year old female with history of COPD, prior CVA, anemia who presented to the hospital with right hip pain.  She has severe osteoarthritis status post right total hip arthroplasty earlier this year who was brought to the ED for worsening right hip pain, slight confusion and a temperature of 103.  Is found to be hypoxic 88% on room air, chest x-ray was concerning for pneumonia, she was admitted to the hospital and started on IV antibiotics    Clinical Impression  Pt admitted with above diagnosis. Pt currently with functional limitations due to the deficits listed below (Nicole PT Problem List). PTA pt reports living with daughter, ambulating with rollator, not wearing home O2. Today pt fatigued and transferring to chair with contact guard. If demonstrates good improvement next PT session may be able to progress ot HHPT with her daughters supervision. She states her daughter has a poor back but that she is able to help, daughter not present to confirm. Pt will benefit from skilled PT to increase their independence and safety with mobility to allow discharge to the venue listed below.       Follow Up Recommendations Home health PT;Supervision for mobility/OOB    Equipment Recommendations  None recommended by PT    Recommendations for Other Services OT consult     Precautions / Restrictions Precautions Precautions: Fall Restrictions Weight Bearing Restrictions: No      Mobility  Bed Mobility Overal bed mobility: Modified Independent                Transfers Overall transfer level: Needs assistance Equipment used: Rolling walker (2 wheeled) Transfers: Sit to/from [facility] Sit to Stand: Min assist Stand pivot transfers: Min guard       General transfer comment: min a to stand, min guard to transfer to  chair  Ambulation/Gait                Stairs            Wheelchair Mobility    Modified Rankin (Stroke Patients Only)       Balance Overall balance assessment: Needs assistance   Sitting balance-Leahy Scale: Fair       Standing balance-Leahy Scale: Fair                               Pertinent Vitals/Pain Pain Assessment: No/denies pain    Home Living Family/patient expects to be discharged to:: Private residence Living Arrangements: Children Available Help at Discharge: Family;Available 24 hours/day Type of Home: Mobile home Home Access: Ramped entrance     Home Layout: One level Home Equipment: [place] - 2 wheels;Walker - 4 wheels;Bedside commode;Wheelchair - manual      Prior Function Level of Independence: Needs assistance   Gait / Transfers Assistance Needed: RW for limited comunity ambulation      Comments: Using Rollator for household ambulation. Daughter assists with R LE dressing and IADL no home O2.     Hand Dominance   Dominant Hand: Right    Extremity/Trunk Assessment   Upper Extremity Assessment Upper Extremity Assessment: Generalized weakness    Lower Extremity Assessment Lower Extremity Assessment: Generalized weakness(R sided weakness worse due to hip pain)       Communication   Communication: No difficulties  Cognition Arousal/Alertness: Awake/alert  General Comments      Exercises     Assessment/Plan    PT Assessment Patient needs continued PT services  PT Problem List Decreased strength       PT Treatment Interventions DME instruction;Gait training;Stair training;Functional mobility training;Therapeutic activities;Therapeutic exercise    PT Goals (Current goals can be found in the Care Plan section)  Acute Rehab PT Goals Patient Stated Goal: return home asap PT Goal Formulation: With patient Time For Goal Achievement:  05/06/18 Potential to Achieve Goals: Good    Frequency Min 3X/week   Barriers to discharge        Co-evaluation               AM-PAC PT "6 Clicks" Mobility  Outcome Measure Help needed turning from your back to your side while in a flat bed without using bedrails?: A Little Help needed moving from lying on your back to sitting on the side of a flat bed without using bedrails?: A Little Help needed moving to and from a bed to a chair (including a wheelchair)?: A Little Help needed standing up from a chair using your arms (e.g., wheelchair or bedside chair)?: A Little Help needed to walk in hospital room?: A Little Help needed climbing 3-5 steps with a railing? : A Little 6 Click Score: 18    End of Session Equipment Utilized During Treatment: Oxygen;Gait belt Activity Tolerance: Patient limited by fatigue Patient left: in chair;with call bell/phone within reach;with chair alarm set Nurse Communication: Mobility status PT Visit Diagnosis: Unsteadiness on feet (R26.81)    Time: 7125-2712 PT Time Calculation (min) (ACUTE ONLY): 24 min   Charges:   PT Evaluation $PT Eval Low Complexity: 1 Low PT Treatments $Therapeutic Activity: 8-22 mins        Reinaldo Berber, PT, DPT Acute Rehabilitation Services Pager: [phone] Office: [phone]    Reinaldo Berber 04/22/2018, 6:01 PM

## 2018-04-22 NOTE — Progress Notes (Signed)
PROGRESS NOTE  [name] FTD:[id number] DOB: 1941/05/05 DOA: 04/20/2018 PCP: Alvester Chou, NP   LOS: 2 days   Brief Narrative / Interim history: 77 year old female with history of COPD, prior CVA, anemia who presented to the hospital with right hip pain.  She has severe osteoarthritis status post right total hip arthroplasty earlier this year who was brought to the ED for worsening right hip pain, slight confusion and a temperature of 103.  Is found to be hypoxic 88% on room air, chest x-ray was concerning for pneumonia, she was admitted to the hospital and started on IV antibiotics  Subjective: -Clinically better this morning, sleepy and slightly confused.  Denies shortness of breath at rest but she is not doing anything  Assessment & Plan: Principal Problem:   Respiratory failure with hypoxia (HCC) Active Problems:   Hypercholesteremia   GERD (gastroesophageal reflux disease)   Anxiety   CAFL (chronic airflow limitation) (HCC)   Benign essential HTN   Depression, major, in partial remission (HCC)   UTI (urinary tract infection)   Long term current use of opiate analgesic   Chronic pain syndrome   Chronic hip pain (Right)   CKD (chronic kidney disease) stage 3, GFR 30-59 ml/min (HCC)   CAP (community acquired pneumonia)   Acute lower UTI   Acute metabolic encephalopathy   Acute hypoxic respiratory failure/ community acquired pneumonia/acute metabolic encephalopathy -Per family she was febrile to 103 at home, still had a fever last night of 102.7, afebrile this morning, white count 14.6 on admission, normalized this morning -Blood cultures negative, MRSA screen negative, urine strep pneumo antigen negative -Chest x-ray on admission with patchy bilateral opacities more pronounced on the right lower lobe concern for pneumonia -She was placed on IV Levaquin, continue  Possible UTI -Urine culture was not obtained prior to antibiotics  Hypertension -Blood pressure  improved, normal, continue to hold home meds  Chronic kidney disease stage III -Creatinine has remained stable, close to baseline, avoid nephrotoxins, BNP this morning pending.  Hold further fluids  Osteoarthritis, chronic right hip pain -Continue Percocet p.o. every 6 hours as needed -New Lyrica 75 mg twice daily -Continue meloxicam, Lidoderm patch, Biofreeze -Appreciate input by Dr. Percell Miller regarding management of her pain.  Depression, anxiety -Continue Celexa 20 mg daily, trazodone 100 mg at bedtime, Xanax 1 mg at bedtime as needed  Hyperlipidemia -Continue statin  Iron deficiency anemia -Continue iron supplementation   Scheduled Meds: . budesonide  1 mg Nebulization BID  . calcium carbonate  500 mg of elemental calcium Oral Daily  . cholecalciferol  5,000 Units Oral Daily  . citalopram  20 mg Oral Daily  . enoxaparin (LOVENOX) injection  40 mg Subcutaneous Q24H  . ferrous sulfate  325 mg Oral Q M,W,F  .  HYDROmorphone (DILAUDID) injection  0.5 mg Intravenous Once  . lidocaine  1 patch Transdermal BID  . linaclotide  290 mcg Oral Daily  . meloxicam  15 mg Oral Daily  . pantoprazole  80 mg Oral Q1200  . polyethylene glycol  17 g Oral Daily  . pravastatin  10 mg Oral q1800  . pregabalin  75 mg Oral BID  . senna-docusate  1 tablet Oral BID  . traZODone  100 mg Oral QHS  . vitamin B-12  500 mcg Oral Daily   Continuous Infusions: . levofloxacin (LEVAQUIN) IV 750 mg (04/22/18 0906)   PRN Meds:.acetaminophen, albuterol, ALPRAZolam, guaiFENesin, ondansetron, oxyCODONE-acetaminophen  DVT prophylaxis: Lovenox Code Status: Full code Family Communication: no family  at bedside Disposition Plan: home when ready   Consultants:   None  Procedures:   None   Antimicrobials:  levaquin   Objective: Vitals:   04/22/18 0023 04/22/18 0330 04/22/18 0732 04/22/18 0807  BP:   117/73   Pulse:   80   Resp:      Temp: (!) 102.7 F (39.3 C) 99.6 F (37.6 C) 98.9 F (37.2  C)   TempSrc: Oral Oral Oral   SpO2: 93%  97% 95%  Weight:      Height:        Intake/Output Summary (Last 24 hours) at 04/22/2018 1250 Last data filed at 04/22/2018 0300 Gross per 24 hour  Intake 2290.29 ml  Output 1300 ml  Net 990.29 ml   Filed Weights   04/20/18 1038  Weight: 81.7 kg    Examination:  Constitutional: sleepy, confused Eyes: PERRL, lids and conjunctivae normal ENMT: Mucous membranes are moist.  Neck: normal, supple Respiratory: Diminished at the bases, no wheezing, bibasilar faint rhonchi Cardiovascular: Regular rate and rhythm, no murmurs / rubs / gallops. Abdomen: no tenderness. Bowel sounds positive.  Musculoskeletal: no clubbing / cyanosis.  Skin: no rashes Neurologic: No focal findings  Data Reviewed: I have independently reviewed following labs and imaging studies   CBC: Recent Labs  Lab 04/20/18 1042 04/21/18 0208 04/22/18 1053  WBC 14.6* 13.7* 8.7  NEUTROABS 12.2*  --  7.1  HGB 12.4 12.0 10.9*  HCT 40.7 40.4 36.3  MCV 98.8 99.3 98.9  PLT 133* 137* 478   Basic Metabolic Panel: Recent Labs  Lab 04/20/18 1042 04/21/18 0208  NA 139 140  K 3.6 4.6  CL 109 111  CO2 24 24  GLUCOSE 95 84  BUN 16 15  CREATININE 1.27* 1.25*  CALCIUM 8.6* 8.8*   GFR: Estimated Creatinine Clearance: 37.9 mL/min (A) (by C-G formula based on SCr of 1.25 mg/dL (H)). Liver Function Tests: Recent Labs  Lab 04/20/18 1042  AST 24  ALT 51*  ALKPHOS 52  BILITOT 0.7  PROT 5.8*  ALBUMIN 2.6*   No results for input(s): LIPASE, AMYLASE in the last 168 hours. No results for input(s): AMMONIA in the last 168 hours. Coagulation Profile: No results for input(s): INR, PROTIME in the last 168 hours. Cardiac Enzymes: Recent Labs  Lab 04/20/18 1042  TROPONINI <0.03   BNP (last 3 results) No results for input(s): PROBNP in the last 8760 hours. HbA1C: No results for input(s): HGBA1C in the last 72 hours. CBG: Recent Labs  Lab 04/21/18 1641  GLUCAP  177*   Lipid Profile: No results for input(s): CHOL, HDL, LDLCALC, TRIG, CHOLHDL, LDLDIRECT in the last 72 hours. Thyroid Function Tests: No results for input(s): TSH, T4TOTAL, FREET4, T3FREE, THYROIDAB in the last 72 hours. Anemia Panel: No results for input(s): VITAMINB12, FOLATE, FERRITIN, TIBC, IRON, RETICCTPCT in the last 72 hours. Urine analysis:    Component Value Date/Time   COLORURINE YELLOW 04/20/2018 1416   APPEARANCEUR CLOUDY (A) 04/20/2018 1416   LABSPEC 1.009 04/20/2018 1416   PHURINE 6.0 04/20/2018 1416   GLUCOSEU NEGATIVE 04/20/2018 1416   HGBUR NEGATIVE 04/20/2018 1416   BILIRUBINUR NEGATIVE 04/20/2018 [place] 04/20/2018 [place] 04/20/2018 1416   UROBILINOGEN 0.2 02/20/2015 0605   NITRITE NEGATIVE 04/20/2018 1416   LEUKOCYTESUR LARGE (A) 04/20/2018 1416   Sepsis Labs: Invalid input(s): PROCALCITONIN, LACTICIDVEN  Recent Results (from the past 240 hour(s))  MRSA PCR Screening     Status: None  Collection Time: 04/20/18  4:14 PM  Result Value Ref Range Status   MRSA by PCR NEGATIVE NEGATIVE Final    Comment:        The GeneXpert MRSA Assay (FDA approved for NASAL specimens only), is one component of a comprehensive MRSA colonization surveillance program. It is not intended to diagnose MRSA infection nor to guide or monitor treatment for MRSA infections. Performed at [facility], [place] [address], [place], [place] [address]   Culture, blood (routine x 2)     Status: None (Preliminary result)   Collection Time: 04/20/18  9:45 PM  Result Value Ref Range Status   Specimen Description BLOOD LEFT HAND  Final   Special Requests   Final    BOTTLES DRAWN AEROBIC ONLY Blood Culture adequate volume   Culture   Final    NO GROWTH 2 DAYS Performed at [facility], [place] [address] [place], [place] [address]    Report Status PENDING  Incomplete  Culture, blood (routine x 2)     Status: None (Preliminary result)    Collection Time: 04/20/18  9:50 PM  Result Value Ref Range Status   Specimen Description BLOOD LEFT HAND  Final   Special Requests   Final    BOTTLES DRAWN AEROBIC ONLY Blood Culture adequate volume   Culture   Final    NO GROWTH 2 DAYS Performed at [facility], [place] [address], [employer] [place] [employer], [place] [address]    Report Status PENDING  Incomplete      Radiology Studies: Dg Chest Port 1 View  Result Date: 04/22/2018 CLINICAL DATA:  Cough and shortness of breath with hypoxia EXAM: PORTABLE CHEST 1 VIEW COMPARISON:  April 20, 2018 FINDINGS: Patchy infiltrate is seen bilaterally, most notably in the right lower lung zone medially. Heart is upper normal in size with pulmonary vascularity normal. No adenopathy. No bone lesions. IMPRESSION: Patchy airspace opacity, likely multifocal pneumonia. There may be a degree of underlying fibrosis as well. Stable cardiac silhouette. No new opacity. Electronically Signed   By: Lowella Grip III M.D.   On: 04/22/2018 12:29    Marzetta Board, MD, PhD [facility] Pager [phone]  If 7PM-7AM, please contact night-coverage [email or web address] Password [email or web address] 04/22/2018, 12:50 PM

## 2018-04-23 MED ORDER — FUROSEMIDE 40 MG PO TABS: 40.0000 mg | ORAL_TABLET | Freq: Once | ORAL | Status: DC

## 2018-04-23 MED ORDER — SENNOSIDES-DOCUSATE SODIUM 8.6-50 MG PO TABS
2.0000 | ORAL_TABLET | Freq: Every day | ORAL | Status: DC
  Administered 2018-04-24 – 2018-04-25 (×2): 2 via ORAL
  Filled 2018-04-23 (×2): qty 2

## 2018-04-23 MED ORDER — FUROSEMIDE 40 MG PO TABS
40.0000 mg | ORAL_TABLET | Freq: Once | ORAL | Status: AC
  Administered 2018-04-23: 40 mg via ORAL
  Filled 2018-04-23: qty 1

## 2018-04-23 MED ORDER — FUROSEMIDE 10 MG/ML IJ SOLN: 20.0000 mg | Freq: Once | INTRAMUSCULAR | Status: DC

## 2018-04-23 MED ORDER — FUROSEMIDE 10 MG/ML IJ SOLN
20.0000 mg | Freq: Once | INTRAMUSCULAR | Status: DC
  Filled 2018-04-23: qty 2

## 2018-04-23 NOTE — Progress Notes (Signed)
PROGRESS NOTE  [name] ZTI:[id number] DOB: [birth year] DOA: 04/20/2018 PCP: Alvester Chou, NP   LOS: 3 days   Brief Narrative / Interim history: 77 year old female with history of COPD, prior CVA, anemia who presented to the hospital with right hip pain.  She has severe osteoarthritis status post right total hip arthroplasty earlier this year who was brought to the ED for worsening right hip pain, slight confusion and a temperature of 103.  Is found to be hypoxic 88% on room air, chest x-ray was concerning for pneumonia, she was admitted to the hospital and started on IV antibiotics  Subjective: -No complaints this morning, denies shortness of breath, denies chest pain, no lightheadedness or dizziness.  She is begging me to let her go home  Assessment & Plan: Principal Problem:   Respiratory failure with hypoxia ([place]) Active Problems:   Hypercholesteremia   GERD (gastroesophageal reflux disease)   Anxiety   CAFL (chronic airflow limitation) (HCC)   Benign essential HTN   Depression, major, in partial remission (HCC)   UTI (urinary tract infection)   Long term current use of opiate analgesic   Chronic pain syndrome   Chronic hip pain (Right)   CKD (chronic kidney disease) stage 3, GFR 30-59 ml/min (HCC)   CAP (community acquired pneumonia)   Acute lower UTI   Acute metabolic encephalopathy   Acute hypoxic respiratory failure/ community acquired pneumonia/acute metabolic encephalopathy -Per family she was febrile to 103 at home, still febrile last night but overall fever curve is improving, white count is now normalized -Cultures are still negative -Chest x-ray on admission with patchy bilateral opacities more pronounced on the right lower lobe concern for pneumonia -Seems to be responding to Levaquin, continue antibiotics, still remains persistently hypoxic on 6 L high flow nasal cannula.  She is not on oxygen at home.  Possible UTI -Urine culture was not obtained prior to  antibiotics, no growth to date  Hypertension -Blood pressure is now normalized continue to hold home antihypertensives, will give Lasix x1 today  Chronic kidney disease stage III -Creatinine has remained stable, close to baseline, avoid nephrotoxins -She was given IV fluids on admission, here slight crackles, will give Lasix x1 and closely monitor renal function  Osteoarthritis, chronic right hip pain -Continue Percocet p.o. every 6 hours as needed -New Lyrica 75 mg twice daily -Continue meloxicam, Lidoderm patch, Biofreeze  Depression, anxiety -Continue Celexa 20 mg daily, trazodone 100 mg at bedtime, Xanax 1 mg at bedtime as needed  Hyperlipidemia -Continue statin  Iron deficiency anemia -Continue iron supplementation   Scheduled Meds: . budesonide  1 mg Nebulization BID  . calcium carbonate  500 mg of elemental calcium Oral Daily  . cholecalciferol  5,000 Units Oral Daily  . citalopram  20 mg Oral Daily  . enoxaparin (LOVENOX) injection  40 mg Subcutaneous Q24H  . ferrous sulfate  325 mg Oral Q M,W,F  . furosemide  40 mg Oral Once  .  HYDROmorphone (DILAUDID) injection  0.5 mg Intravenous Once  . lidocaine  1 patch Transdermal BID  . linaclotide  290 mcg Oral Daily  . meloxicam  15 mg Oral Daily  . pantoprazole  80 mg Oral Q1200  . polyethylene glycol  17 g Oral Daily  . pravastatin  10 mg Oral q1800  . pregabalin  75 mg Oral BID  . senna-docusate  1 tablet Oral BID  . traZODone  100 mg Oral QHS  . vitamin B-12  500 mcg Oral Daily  Continuous Infusions: . levofloxacin (LEVAQUIN) IV Stopped (04/22/18 1832)   PRN Meds:.acetaminophen, albuterol, ALPRAZolam, guaiFENesin, ondansetron, oxyCODONE-acetaminophen  DVT prophylaxis: Lovenox Code Status: Full code Family Communication: no family at bedside Disposition Plan: home when ready   Consultants:   None  Procedures:   None   Antimicrobials:  levaquin   Objective: Vitals:   04/22/18 2326 04/23/18  0300 04/23/18 0729 04/23/18 0813  BP: 127/81  136/89   Pulse:   75 84  Resp: 18  17 18   Temp: (!) 101.3 F (38.5 C) 98.8 F (37.1 C)    TempSrc: Oral     SpO2: 91%  99% 90%  Weight:      Height:        Intake/Output Summary (Last 24 hours) at 04/23/2018 1054 Last data filed at 04/23/2018 0938 Gross per 24 hour  Intake 150 ml  Output 2400 ml  Net -2250 ml   Filed Weights   04/20/18 1038  Weight: 81.7 kg    Examination:  Constitutional: No distress, alert Eyes: No scleral icterus ENMT: Moist mucous membranes Neck: Normal, Respiratory: Diminished sounds at the bases, faint bibasilar crackles, no wheezing heard Cardiovascular: Regular rate and rhythm, no murmurs, no edema Abdomen: Soft, nontender, nondistended, positive bowel sounds Musculoskeletal: no clubbing / cyanosis.  Skin: No rashes seen Neurologic: Equal strength, no focal deficits  Data Reviewed: I have independently reviewed following labs and imaging studies   CBC: Recent Labs  Lab 04/20/18 1042 04/21/18 0208 04/22/18 1053  WBC 14.6* 13.7* 8.7  NEUTROABS 12.2*  --  7.1  HGB 12.4 12.0 10.9*  HCT 40.7 40.4 36.3  MCV 98.8 99.3 98.9  PLT 133* 137* 182   Basic Metabolic Panel: Recent Labs  Lab 04/20/18 1042 04/21/18 0208 04/22/18 1248  NA 139 140 140  K 3.6 4.6 4.2  CL 109 111 111  CO2 24 24 21*  GLUCOSE 95 84 95  BUN 16 15 16   CREATININE 1.27* 1.25* 1.11*  CALCIUM 8.6* 8.8* 8.9   GFR: Estimated Creatinine Clearance: 42.7 mL/min (A) (by C-G formula based on SCr of 1.11 mg/dL (H)). Liver Function Tests: Recent Labs  Lab 04/20/18 1042 04/22/18 1248  AST 24 23  ALT 51* 28  ALKPHOS 52 54  BILITOT 0.7 0.6  PROT 5.8* 5.8*  ALBUMIN 2.6* 2.1*   No results for input(s): LIPASE, AMYLASE in the last 168 hours. No results for input(s): AMMONIA in the last 168 hours. Coagulation Profile: No results for input(s): INR, PROTIME in the last 168 hours. Cardiac Enzymes: Recent Labs  Lab  04/20/18 1042  TROPONINI <0.03   BNP (last 3 results) No results for input(s): PROBNP in the last 8760 hours. HbA1C: No results for input(s): HGBA1C in the last 72 hours. CBG: Recent Labs  Lab 04/21/18 1641  GLUCAP 177*   Lipid Profile: No results for input(s): CHOL, HDL, LDLCALC, TRIG, CHOLHDL, LDLDIRECT in the last 72 hours. Thyroid Function Tests: No results for input(s): TSH, T4TOTAL, FREET4, T3FREE, THYROIDAB in the last 72 hours. Anemia Panel: No results for input(s): VITAMINB12, FOLATE, FERRITIN, TIBC, IRON, RETICCTPCT in the last 72 hours. Urine analysis:    Component Value Date/Time   COLORURINE YELLOW 04/20/2018 1416   APPEARANCEUR CLOUDY (A) 04/20/2018 1416   LABSPEC 1.009 04/20/2018 1416   PHURINE 6.0 04/20/2018 [place] 04/20/2018 1416   HGBUR NEGATIVE 04/20/2018 [place] 04/20/2018 [place] 04/20/2018 [place] 04/20/2018 1416  UROBILINOGEN 0.2 02/20/2015 0605   NITRITE NEGATIVE 04/20/2018 1416   LEUKOCYTESUR LARGE (A) 04/20/2018 1416   Sepsis Labs: Invalid input(s): PROCALCITONIN, LACTICIDVEN  Recent Results (from the past 240 hour(s))  MRSA PCR Screening     Status: None   Collection Time: 04/20/18  4:14 PM  Result Value Ref Range Status   MRSA by PCR NEGATIVE NEGATIVE Final    Comment:        The GeneXpert MRSA Assay (FDA approved for NASAL specimens only), is one component of a comprehensive MRSA colonization surveillance program. It is not intended to diagnose MRSA infection nor to guide or monitor treatment for MRSA infections. Performed at [facility], [place] [address], [place], [place] [address]   Culture, blood (routine x 2)     Status: None (Preliminary result)   Collection Time: 04/20/18  9:45 PM  Result Value Ref Range Status   Specimen Description BLOOD LEFT HAND  Final   Special Requests   Final    BOTTLES DRAWN AEROBIC ONLY Blood Culture adequate volume    Culture   Final    NO GROWTH 3 DAYS Performed at [facility], [place] [address], [place], [place] [address]    Report Status PENDING  Incomplete  Culture, blood (routine x 2)     Status: None (Preliminary result)   Collection Time: 04/20/18  9:50 PM  Result Value Ref Range Status   Specimen Description BLOOD LEFT HAND  Final   Special Requests   Final    BOTTLES DRAWN AEROBIC ONLY Blood Culture adequate volume   Culture   Final    NO GROWTH 3 DAYS Performed at [facility], [address], [place], [place] [address]    Report Status PENDING  Incomplete      Radiology Studies: Dg Chest Port 1 View  Result Date: 04/22/2018 CLINICAL DATA:  Cough and shortness of breath with hypoxia EXAM: PORTABLE CHEST 1 VIEW COMPARISON:  April 20, 2018 FINDINGS: Patchy infiltrate is seen bilaterally, most notably in the right lower lung zone medially. Heart is upper normal in size with pulmonary vascularity normal. No adenopathy. No bone lesions. IMPRESSION: Patchy airspace opacity, likely multifocal pneumonia. There may be a degree of underlying fibrosis as well. Stable cardiac silhouette. No new opacity. Electronically Signed   By: Lowella Grip III M.D.   On: 04/22/2018 12:29    Marzetta Board, MD, PhD [facility] Pager [phone]  If 7PM-7AM, please contact night-coverage [email or web address] Password [facility] 04/23/2018, 10:54 AM

## 2018-04-24 LAB — BASIC METABOLIC PANEL
Anion gap: 8 (ref 5–15)
BUN: 23 mg/dL (ref 8–23)
CO2: 24 mmol/L (ref 22–32)
Calcium: 9 mg/dL (ref 8.9–10.3)
Chloride: 107 mmol/L (ref 98–111)
Creatinine, Ser: 1.17 mg/dL — ABNORMAL HIGH (ref 0.44–1.00)
GFR calc Af Amer: 52 mL/min — ABNORMAL LOW (ref >60)
GFR calc non Af Amer: 45 mL/min — ABNORMAL LOW (ref >60)
Glucose, Bld: 87 mg/dL (ref 70–99)
Potassium: 3.8 mmol/L (ref 3.5–5.1)
Sodium: 139 mmol/L (ref 135–145)

## 2018-04-24 LAB — URINE CULTURE: Culture: >=100000 — AB

## 2018-04-24 MED ORDER — LEVOFLOXACIN 750 MG PO TABS
750.0000 mg | ORAL_TABLET | ORAL | Status: DC
  Administered 2018-04-24: 750 mg via ORAL
  Filled 2018-04-24 (×2): qty 1

## 2018-04-24 MED ORDER — FUROSEMIDE 40 MG PO TABS
40.0000 mg | ORAL_TABLET | Freq: Once | ORAL | Status: AC
  Administered 2018-04-24: 40 mg via ORAL
  Filled 2018-04-24: qty 1

## 2018-04-24 MED ORDER — LEVOFLOXACIN 750 MG PO TABS: 750.0000 mg | ORAL_TABLET | ORAL | 0 refills | Status: DC

## 2018-04-24 NOTE — Progress Notes (Signed)
Physical Therapy Treatment Patient Details Name: [name] MRN: [id number] DOB: 04-07-1941 Today's Date: 04/24/2018    History of Present Illness 77 year old female with history of COPD, prior CVA, anemia who presented to the hospital with right hip pain.  She has severe osteoarthritis status post right total hip arthroplasty earlier this year who was brought to the ED for worsening right hip pain, slight confusion and a temperature of 103.  Is found to be hypoxic 88% on room air, chest x-ray was concerning for pneumonia, she was admitted to the hospital and started on IV antibiotics    PT Comments    Patient progressing to short distance ambulation in hospital room today. Experiencing limited activity tolerance with shortness of breath and coughing for over 5 minutes, de-satting to 79% on 3L. States "I don't think I can go home like this".  Requires close assistance and chair follow for mobility. Updated recs to SNF to reflect lack of progress with therapy and need for sub acute rehab prior to return home.    Follow Up Recommendations  SNF     Equipment Recommendations  None recommended by PT    Recommendations for Other Services OT consult     Precautions / Restrictions Precautions Precautions: Fall Restrictions Weight Bearing Restrictions: No    Mobility  Bed Mobility Overal bed mobility: Modified Independent                Transfers Overall transfer level: Needs assistance Equipment used: Rolling walker (2 wheeled) Transfers: Sit to/from [facility] Sit to Stand: Min assist Stand pivot transfers: Min guard          Ambulation/Gait Ambulation/Gait assistance: Min guard Gait Distance (Feet): 5 Feet Assistive device: Rolling walker (2 wheeled) Gait Pattern/deviations: Step-to pattern Gait velocity: decreased   General Gait Details: pt with severeal steps in hospital room before deatting to 79% on 3L, coghing with shortness of breath.  returned to seated position and took several minutes to recover.    Stairs             Wheelchair Mobility    Modified Rankin (Stroke Patients Only)       Balance Overall balance assessment: Needs assistance   Sitting balance-Leahy Scale: Fair       Standing balance-Leahy Scale: Fair                              Cognition Arousal/Alertness: Awake/alert Behavior During Therapy: WFL for tasks assessed/performed Overall Cognitive Status: Within Functional Limits for tasks assessed                                        Exercises      General Comments        Pertinent Vitals/Pain Pain Assessment: No/denies pain    Home Living                      Prior Function            PT Goals (current goals can now be found in the care plan section) Acute Rehab PT Goals Patient Stated Goal: return home asap PT Goal Formulation: With patient Time For Goal Achievement: 05/06/18 Potential to Achieve Goals: Good Progress towards PT goals: Progressing toward goals    Frequency    Min 2X/week  PT Plan Discharge plan needs to be updated    Co-evaluation              AM-PAC PT "6 Clicks" Mobility   Outcome Measure  Help needed turning from your back to your side while in a flat bed without using bedrails?: A Little Help needed moving from lying on your back to sitting on the side of a flat bed without using bedrails?: A Little Help needed moving to and from a bed to a chair (including a wheelchair)?: A Little Help needed standing up from a chair using your arms (e.g., wheelchair or bedside chair)?: A Little Help needed to walk in hospital room?: A Lot Help needed climbing 3-5 steps with a railing? : A Lot 6 Click Score: 16    End of Session Equipment Utilized During Treatment: Oxygen;Gait belt Activity Tolerance: Patient limited by fatigue Patient left: in chair;with call bell/phone within reach;with chair  alarm set Nurse Communication: Mobility status PT Visit Diagnosis: Unsteadiness on feet (R26.81)     Time: 4825-0037 PT Time Calculation (min) (ACUTE ONLY): 23 min  Charges:  $Gait Training: 8-22 mins $Therapeutic Activity: 8-22 mins                     Reinaldo Berber, PT, DPT Acute Rehabilitation Services Pager: [phone] Office: [place] 04/24/2018, 1:23 PM

## 2018-04-24 NOTE — Clinical Social Work Note (Addendum)
MD spoke with pt and pt's daughter at bedside--refusing SNF. Pt will go home with home health. RNCM already aware. Clinical Social Worker will sign off for now as social work intervention is no longer needed. Please consult [place] again if new need arises.   [place], [place]

## 2018-04-24 NOTE — Progress Notes (Signed)
Increased to 3lpm

## 2018-04-24 NOTE — Care Management Important Message (Signed)
Important Message  Patient Details  Name: [name] MRN: [id number] Date of Birth: 03-14-1941   Medicare Important Message Given:  Yes    Yahaira Bruski 04/24/2018, 12:38 PM

## 2018-04-24 NOTE — Progress Notes (Signed)
PROGRESS NOTE  [name] WUJ:[id number] DOB: 1940/12/16 DOA: 04/20/2018 PCP: Alvester Chou, NP   LOS: 4 days   Brief Narrative / Interim history: 77 year old female with history of COPD, prior CVA, anemia who presented to the hospital with right hip pain.  She has severe osteoarthritis status post right total hip arthroplasty earlier this year who was brought to the ED for worsening right hip pain, slight confusion and a temperature of 103.  Is found to be hypoxic 88% on room air, chest x-ray was concerning for pneumonia, she was admitted to the hospital and started on IV antibiotics  Subjective: -initially doing well, worked with PT late morning and became acutely hypoxic with sats in the 70s after barely 5 steps, dyspneic and having a coughing spell   Assessment & Plan: Principal Problem:   Respiratory failure with hypoxia ([place]) Active Problems:   Hypercholesteremia   GERD (gastroesophageal reflux disease)   Anxiety   CAFL (chronic airflow limitation) (HCC)   Benign essential HTN   Depression, major, in partial remission ([place])   UTI (urinary tract infection)   Long term current use of opiate analgesic   Chronic pain syndrome   Chronic hip pain (Right)   CKD (chronic kidney disease) stage 3, GFR 30-59 ml/min (HCC)   CAP (community acquired pneumonia)   Acute lower UTI   Acute metabolic encephalopathy   Acute hypoxic respiratory failure/ community acquired pneumonia/acute metabolic encephalopathy -Per family she was febrile to 103 at home, fever resolved, white count is now normalized -Cultures are still negative -Chest x-ray on admission with patchy bilateral opacities more pronounced on the right lower lobe concern for pneumonia -Seems to be responding to Levaquin, continue antibiotics, hypoxia improving but still significant with ambulation as above. Continue supportive care  Possible UTI -Urine culture was not obtained prior to antibiotics, no growth to  date  Hypertension -Blood pressure is now normalized continue to hold home antihypertensives  Chronic kidney disease stage III -Creatinine has remained stable, close to baseline, avoid nephrotoxins -She was given IV fluids on admission, s/p Lasix x 1, renal function stable. Repeat Lasix today   Osteoarthritis, chronic right hip pain -Continue Percocet p.o. every 6 hours as needed -[place] 75 mg twice daily -Continue meloxicam, Lidoderm patch, Biofreeze  Depression, anxiety -Continue Celexa 20 mg daily, trazodone 100 mg at bedtime, Xanax 1 mg at bedtime as needed  Hyperlipidemia -Continue statin  Iron deficiency anemia -Continue iron supplementation   Scheduled Meds: . budesonide  1 mg Nebulization BID  . calcium carbonate  500 mg of elemental calcium Oral Daily  . cholecalciferol  5,000 Units Oral Daily  . citalopram  20 mg Oral Daily  . enoxaparin (LOVENOX) injection  40 mg Subcutaneous Q24H  . ferrous sulfate  325 mg Oral Q M,W,F  .  HYDROmorphone (DILAUDID) injection  0.5 mg Intravenous Once  . levofloxacin  750 mg Oral Q48H  . lidocaine  1 patch Transdermal BID  . linaclotide  290 mcg Oral Daily  . meloxicam  15 mg Oral Daily  . pantoprazole  80 mg Oral Q1200  . polyethylene glycol  17 g Oral Daily  . pravastatin  10 mg Oral q1800  . pregabalin  75 mg Oral BID  . senna-docusate  2 tablet Oral Daily  . traZODone  100 mg Oral QHS  . vitamin B-12  500 mcg Oral Daily   Continuous Infusions:  PRN Meds:.acetaminophen, albuterol, ALPRAZolam, guaiFENesin, ondansetron, oxyCODONE-acetaminophen  DVT prophylaxis: Lovenox Code Status: Full  code Family Communication: no family at bedside Disposition Plan: home when ready   Consultants:   None  Procedures:   None   Antimicrobials:  levaquin   Objective: Vitals:   04/23/18 1606 04/23/18 1923 04/24/18 0804 04/24/18 0812  BP: 127/83  (!) 123/110   Pulse: 99  (!) 107   Resp: 15  18   Temp: 98.1 F (36.7  C)  98.4 F (36.9 C)   TempSrc: Oral  Oral   SpO2: (!) 87% 92% 93% (!) 87%  Weight:      Height:        Intake/Output Summary (Last 24 hours) at 04/24/2018 1528 Last data filed at 04/24/2018 0615 Gross per 24 hour  Intake -  Output 1050 ml  Net -1050 ml   Filed Weights   04/20/18 1038  Weight: 81.7 kg    Examination:  Constitutional: No distress at rest Eyes: no icterus  ENMT: mmm Respiratory: diminished sounds at the bases, no crackles Cardiovascular: RRR, no edema  Data Reviewed: I have independently reviewed following labs and imaging studies   CBC: Recent Labs  Lab 04/20/18 1042 04/21/18 0208 04/22/18 1053  WBC 14.6* 13.7* 8.7  NEUTROABS 12.2*  --  7.1  HGB 12.4 12.0 10.9*  HCT 40.7 40.4 36.3  MCV 98.8 99.3 98.9  PLT 133* 137* 433   Basic Metabolic Panel: Recent Labs  Lab 04/20/18 1042 04/21/18 0208 04/22/18 1248 04/24/18 0722  NA 139 140 140 139  K 3.6 4.6 4.2 3.8  CL 109 111 111 107  CO2 24 24 21* 24  GLUCOSE 95 84 95 87  BUN 16 15 16 23   CREATININE 1.27* 1.25* 1.11* 1.17*  CALCIUM 8.6* 8.8* 8.9 9.0   GFR: Estimated Creatinine Clearance: 40.5 mL/min (A) (by C-G formula based on SCr of 1.17 mg/dL (H)). Liver Function Tests: Recent Labs  Lab 04/20/18 1042 04/22/18 1248  AST 24 23  ALT 51* 28  ALKPHOS 52 54  BILITOT 0.7 0.6  PROT 5.8* 5.8*  ALBUMIN 2.6* 2.1*   No results for input(s): LIPASE, AMYLASE in the last 168 hours. No results for input(s): AMMONIA in the last 168 hours. Coagulation Profile: No results for input(s): INR, PROTIME in the last 168 hours. Cardiac Enzymes: Recent Labs  Lab 04/20/18 1042  TROPONINI <0.03   BNP (last 3 results) No results for input(s): PROBNP in the last 8760 hours. HbA1C: No results for input(s): HGBA1C in the last 72 hours. CBG: Recent Labs  Lab 04/21/18 1641  GLUCAP 177*   Lipid Profile: No results for input(s): CHOL, HDL, LDLCALC, TRIG, CHOLHDL, LDLDIRECT in the last 72  hours. Thyroid Function Tests: No results for input(s): TSH, T4TOTAL, FREET4, T3FREE, THYROIDAB in the last 72 hours. Anemia Panel: No results for input(s): VITAMINB12, FOLATE, FERRITIN, TIBC, IRON, RETICCTPCT in the last 72 hours. Urine analysis:    Component Value Date/Time   COLORURINE YELLOW 04/20/2018 1416   APPEARANCEUR CLOUDY (A) 04/20/2018 1416   LABSPEC 1.009 04/20/2018 1416   PHURINE 6.0 04/20/2018 1416   GLUCOSEU NEGATIVE 04/20/2018 1416   HGBUR NEGATIVE 04/20/2018 1416   BILIRUBINUR NEGATIVE 04/20/2018 [place] 04/20/2018 [place] 04/20/2018 1416   UROBILINOGEN 0.2 02/20/2015 0605   NITRITE NEGATIVE 04/20/2018 1416   LEUKOCYTESUR LARGE (A) 04/20/2018 1416   Sepsis Labs: Invalid input(s): PROCALCITONIN, LACTICIDVEN  Recent Results (from the past 240 hour(s))  MRSA PCR Screening     Status: None   Collection Time: 04/20/18  4:14 PM  Result Value Ref Range Status   MRSA by PCR NEGATIVE NEGATIVE Final    Comment:        The GeneXpert MRSA Assay (FDA approved for NASAL specimens only), is one component of a comprehensive MRSA colonization surveillance program. It is not intended to diagnose MRSA infection nor to guide or monitor treatment for MRSA infections. Performed at [facility], [place] [address], [place], [place] [address]   Culture, blood (routine x 2)     Status: None (Preliminary result)   Collection Time: 04/20/18  9:45 PM  Result Value Ref Range Status   Specimen Description BLOOD LEFT HAND  Final   Special Requests   Final    BOTTLES DRAWN AEROBIC ONLY Blood Culture adequate volume   Culture   Final    NO GROWTH 4 DAYS Performed at [facility], [place] [address], [place], [place] [address]    Report Status PENDING  Incomplete  Culture, blood (routine x 2)     Status: None (Preliminary result)   Collection Time: 04/20/18  9:50 PM  Result Value Ref Range Status   Specimen Description BLOOD LEFT HAND   Final   Special Requests   Final    BOTTLES DRAWN AEROBIC ONLY Blood Culture adequate volume   Culture   Final    NO GROWTH 4 DAYS Performed at [facility], [place] [address], [place], [place] [address]    Report Status PENDING  Incomplete  Culture, Urine     Status: Abnormal   Collection Time: 04/21/18  6:05 PM  Result Value Ref Range Status   Specimen Description URINE, RANDOM  Final   Special Requests   Final    NONE Performed at [facility], [place] [address], [place], [place] [address]    Culture (A)  Final    >=100,000 COLONIES/mL VIRIDANS STREPTOCOCCUS 70,000 COLONIES/mL CITROBACTER FREUNDII    Report Status 04/24/2018 FINAL  Final   Organism ID, Bacteria CITROBACTER FREUNDII (A)  Final      Susceptibility   Citrobacter freundii - MIC*    CEFAZOLIN >=64 RESISTANT Resistant     CEFTRIAXONE <=1 SENSITIVE Sensitive     CIPROFLOXACIN <=0.25 SENSITIVE Sensitive     GENTAMICIN <=1 SENSITIVE Sensitive     IMIPENEM <=0.25 SENSITIVE Sensitive     NITROFURANTOIN <=16 SENSITIVE Sensitive     TRIMETH/SULFA <=20 SENSITIVE Sensitive     PIP/TAZO <=4 SENSITIVE Sensitive     * 70,000 COLONIES/mL CITROBACTER FREUNDII      Radiology Studies: No results found.  Marzetta Board, MD, PhD [facility] Pager [phone]  If 7PM-7AM, please contact night-coverage [email or web address] Password [facility] 04/24/2018, 3:28 PM

## 2018-04-24 NOTE — Progress Notes (Signed)
SATURATION QUALIFICATIONS: (This note is used to comply with regulatory documentation for home oxygen)  Patient Saturations on Room Air at Rest = 85%  Patient Saturations on Room Air while Ambulating = 82%  Patient Saturations on 3 Liters of oxygen while Ambulating = 94%  Please briefly explain why patient needs home oxygen: Pt's oxygen goes below therapeutic level even on room air while resting. She becomes increasing dyspneic with minimal exertion without oxygen on.

## 2018-04-24 NOTE — Discharge Instructions (Signed)
Follow with Nicole Chou, NP in 5-7 days  Please get a complete blood count and chemistry panel checked by your Primary MD at your next visit, and again as instructed by your Primary MD. Please get your medications reviewed and adjusted by your Primary MD.  Please request your Primary MD to go over all Hospital Tests and Procedure/Radiological results at the follow up, please get all Hospital records sent to your Prim MD by signing hospital release before you go home.  If you had Pneumonia of Lung problems at the Hospital: Please get a 2 view Chest X ray done in 6-8 weeks after hospital discharge or sooner if instructed by your Primary MD.  If you have Congestive Heart Failure: Please call your Cardiologist or Primary MD anytime you have any of the following symptoms:  1) 3 pound weight gain in 24 hours or 5 pounds in 1 week  2) shortness of breath, with or without a dry hacking cough  3) swelling in the hands, feet or stomach  4) if you have to sleep on extra pillows at night in order to breathe  Follow cardiac low salt diet and 1.5 lit/day fluid restriction.  If you have diabetes Accuchecks 4 times/day, Once in AM empty stomach and then before each meal. Log in all results and show them to your primary doctor at your next visit. If any glucose reading is under 80 or above 300 call your primary MD immediately.  If you have Seizure/Convulsions/Epilepsy: Please do not drive, operate heavy machinery, participate in activities at heights or participate in high speed sports until you have seen by Primary MD or a Neurologist and advised to do so again.  If you had Gastrointestinal Bleeding: Please ask your Primary MD to check a complete blood count within one week of discharge or at your next visit. Your endoscopic/colonoscopic biopsies that are pending at the time of discharge, will also need to followed by your Primary MD.  Get Medicines reviewed and adjusted. Please take all your  medications with you for your next visit with your Primary MD  Please request your Primary MD to go over all hospital tests and procedure/radiological results at the follow up, please ask your Primary MD to get all Hospital records sent to his/her office.  If you experience worsening of your admission symptoms, develop shortness of breath, life threatening emergency, suicidal or homicidal thoughts you must seek medical attention immediately by calling 911 or calling your MD immediately  if symptoms less severe.  You must read complete instructions/literature along with all the possible adverse reactions/side effects for all the Medicines you take and that have been prescribed to you. Take any new Medicines after you have completely understood and accpet all the possible adverse reactions/side effects.   Do not drive or operate heavy machinery when taking Pain medications.   Do not take more than prescribed Pain, Sleep and Anxiety Medications  Special Instructions: If you have smoked or chewed Tobacco  in the last 2 yrs please stop smoking, stop any regular Alcohol  and or any Recreational drug use.  Wear Seat belts while driving.  Please note You were cared for by a hospitalist during your hospital stay. If you have any questions about your discharge medications or the care you received while you were in the hospital after you are discharged, you can call the unit and asked to speak with the hospitalist on call if the hospitalist that took care of you is not available. Once  you are discharged, your primary care physician will handle any further medical issues. Please note that NO REFILLS for any discharge medications will be authorized once you are discharged, as it is imperative that you return to your primary care physician (or establish a relationship with a primary care physician if you do not have one) for your aftercare needs so that they can reassess your need for medications and monitor your  lab values.  You can reach the hospitalist office at phone [phone] or fax [phone]   If you do not have a primary care physician, you can call [phone] for a physician referral.  Activity: As tolerated with Full fall precautions use walker/cane & assistance as needed  Diet: regular  Disposition Home

## 2018-04-24 NOTE — Evaluation (Signed)
Occupational Therapy Evaluation Patient Details Name: [name] MRN: [id number] DOB: 21-Jan-1941 Today's Date: 04/24/2018    History of Present Illness 77 year old female with history of COPD, prior CVA, anemia who presented to the hospital with right hip pain.  She has severe osteoarthritis status post right total hip arthroplasty earlier this year who was brought to the ED for worsening right hip pain, slight confusion and a temperature of 103.  Is found to be hypoxic 88% on room air, chest x-ray was concerning for pneumonia, she was admitted to the hospital and started on IV antibiotics   Clinical Impression   Pt was assisted for LB ADL (R) and sponge bathed prior to admission, although she owns AE. She was dependent in IADL and ambulated with a rollator. Pt presents with decreased activity tolerance, with dyspnea with min exertion. She is adamant she wants to return home and that her daughter can assist as needed. Will follow acutely.    Follow Up Recommendations  Home health OT    Equipment Recommendations  None recommended by OT    Recommendations for Other Services       Precautions / Restrictions Precautions Precautions: Fall Precaution Comments: desats with minimal exertion Restrictions Weight Bearing Restrictions: No      Mobility Bed Mobility Overal bed mobility: Modified Independent                Transfers Overall transfer level: Needs assistance Equipment used: Rolling walker (2 wheeled) Transfers: Sit to/from [facility] Sit to Stand: Min assist Stand pivot transfers: Min guard       General transfer comment: min assist to stand, min guard for safety with transfer    Balance Overall balance assessment: Needs assistance   Sitting balance-Leahy Scale: Fair       Standing balance-Leahy Scale: Fair Standing balance comment: can release walker in static standing during pericare                           ADL either  performed or assessed with clinical judgement   ADL Overall ADL's : Needs assistance/impaired Eating/Feeding: Independent;Sitting   Grooming: Wash/dry hands;Wash/dry face;Sitting;Set up Grooming Details (indicate cue type and reason): unable to tolerate standing a sink Upper Body Bathing: Minimal assistance;Sitting   Lower Body Bathing: Sit to/from stand;Minimal assistance   Upper Body Dressing : Set up;Sitting   Lower Body Dressing: Sit to/from stand;Minimal assistance   Toilet Transfer: Min guard;BSC;Stand-pivot   Toileting- Clothing Manipulation and Hygiene: Sit to/from stand;Min guard       Functional mobility during ADLs: Min guard;Rolling walker       Vision Patient Visual Report: No change from baseline       Perception     Praxis      Pertinent Vitals/Pain Pain Assessment: Faces Faces Pain Scale: No hurt     Hand Dominance Right   Extremity/Trunk Assessment Upper Extremity Assessment Upper Extremity Assessment: Overall WFL for tasks assessed   Lower Extremity Assessment Lower Extremity Assessment: Defer to PT evaluation       Communication Communication Communication: No difficulties   Cognition Arousal/Alertness: Awake/alert Behavior During Therapy: WFL for tasks assessed/performed Overall Cognitive Status: Within Functional Limits for tasks assessed                                     General Comments  Exercises     Shoulder Instructions      Home Living Family/patient expects to be discharged to:: Private residence Living Arrangements: Children(daughter) Available Help at Discharge: Family;Available 24 hours/day Type of Home: Mobile home Home Access: Ramped entrance     Home Layout: One level     Bathroom Shower/Tub: Other (comment)(sponge bathes and washes hair in sink)   Bathroom Toilet: Standard Bathroom Accessibility: No   Home Equipment: Walker - 2 wheels;Walker - 4 wheels;Bedside commode;Wheelchair -  Scientist, physiological: Reacher;Sock aid        Prior Functioning/Environment Level of Independence: Needs assistance  Gait / Transfers Assistance Needed: RW for limited community ambulation  ADL's / Homemaking Assistance Needed: Pt sponge bathes and dresses with assist for LEs, although she does own AE, dependent in meal prep and housekeeping            OT Problem List: Decreased activity tolerance;Impaired balance (sitting and/or standing);Decreased strength;Cardiopulmonary status limiting activity;Decreased knowledge of use of DME or AE      OT Treatment/Interventions: Self-care/ADL training;DME and/or AE instruction;Patient/family education;Balance training;Energy conservation    OT Goals(Current goals can be found in the care plan section) Acute Rehab OT Goals Patient Stated Goal: return home asap OT Goal Formulation: With patient Time For Goal Achievement: 05/08/18 Potential to Achieve Goals: Good ADL Goals Pt Will Perform Grooming: with supervision;standing(2 activities) Pt Will Perform Lower Body Bathing: with supervision;sit to/from stand;with adaptive equipment Pt Will Perform Lower Body Dressing: with supervision;sit to/from stand;with adaptive equipment Pt Will Transfer to Toilet: with supervision;ambulating;regular height toilet Pt Will Perform Toileting - Clothing Manipulation and hygiene: with supervision;sit to/from stand Additional ADL Goal #1: Pt will utilize breathing and energy conservation strategies in ADL and mobility with min verbal cues.  OT Frequency: Min 2X/week   Barriers to D/C:            Co-evaluation              AM-PAC OT "6 Clicks" Daily Activity     Outcome Measure Help from another person eating meals?: None Help from another person taking care of personal grooming?: A Little Help from another person toileting, which includes using toliet, bedpan, or urinal?: A Little Help from another person bathing  (including washing, rinsing, drying)?: A Little Help from another person to put on and taking off regular upper body clothing?: None Help from another person to put on and taking off regular lower body clothing?: A Little 6 Click Score: 20   End of Session Equipment Utilized During Treatment: Gait belt;Rolling walker;Oxygen  Activity Tolerance: Patient limited by fatigue Patient left: in bed;with call bell/phone within reach  OT Visit Diagnosis: Unsteadiness on feet (R26.81);Other abnormalities of gait and mobility (R26.89);Muscle weakness (generalized) (M62.81)                Time: 3419-3790 OT Time Calculation (min): 15 min Charges:  OT General Charges $OT Visit: 1 Visit OT Evaluation $OT Eval Moderate Complexity: 1 Mod  Nestor Lewandowsky, OTR/L Acute Rehabilitation Services Pager: [phone] Office: [phone]  Malka So 04/24/2018, 4:36 PM

## 2018-04-24 NOTE — Progress Notes (Signed)
CM talked to pt/ pt daughter regarding [place] choices, they requested [facility]; [place] with [facility] called for arrangements; Patient also needs home oxygen, Butch Penny with [facility] called - a portable oxygen tank will be delivered to the room today prior to discharge and more oxygen tanks will be delivered to the home. [name] [facility] [phone]

## 2018-04-25 DIAGNOSIS — J9621 Acute and chronic respiratory failure with hypoxia: Secondary | ICD-10-CM

## 2018-04-25 LAB — CULTURE, BLOOD (ROUTINE X 2)
Culture: NO GROWTH
Culture: NO GROWTH
Special Requests: ADEQUATE
Special Requests: ADEQUATE

## 2018-04-25 MED ORDER — GUAIFENESIN 100 MG/5ML PO SOLN: 5.0000 mL | ORAL | Status: DC | PRN

## 2018-04-25 MED ORDER — BISACODYL 10 MG RE SUPP
10.0000 mg | Freq: Once | RECTAL | Status: AC
  Administered 2018-04-25: 10 mg via RECTAL
  Filled 2018-04-25: qty 1

## 2018-04-25 NOTE — Discharge Summary (Signed)
Physician Discharge Summary  [name]:[id number] DOB: February 06, 1941 DOA: 04/20/2018  PCP: Alvester Chou, NP  Admit date: 04/20/2018 Discharge date: 04/25/2018  Admitted From: home Disposition:  Home (refused SNF)  Recommendations for Outpatient Follow-up:  1. Follow up with PCP in 1-2 weeks  Home Health: PT Equipment/Devices: walker  Discharge Condition: stable CODE STATUS: Full code Diet recommendation: regular  HPI: Per Dr. Steffanie Dunn, [name] is a 77 y.o female with a PMH of COPD, CVA, anemia who presented to the ED via EMS this morning with complaint of right hip pain.  Patient has a history of severe osteoarthritis status post right total hip arthroplasty and has had several ED visits for right hip pain.  Her daughter noticed that she was complaining of more pain the last 2 days and also was somewhat confused.  This morning she had a temperature of 103.  Patient is upset because she states that she is not able to get her oxycodone filled anymore and she does not understand why.  According to the daughter, there was some concern by her providers she was overusing this but patient adamantly denies this.  She was found to be hypoxic to 88% on room air.  She has a history of COPD but has never required home oxygen.  She denies shortness of breath, increase or change in her chronic mild cough, chest pain.  Her daughter thinks that she has had a fever for at least 3 days.  Patient's only complaint is continued right hip pain which is 9 out of 10 with movement and 0 out of 10 if she lies still.  She has had to stop getting physical therapy because the pain is too bad.  She has a right hip bone stimulator currently that was placed in May by Dr. Percell Miller.  Patient has gotten to the point where she is unable to walk or put any weight on her right leg due to her hip pain.  Patient has a history of recurrent UTIs, last one was approximately 2 to 3 months ago.  She does state that she is  having lower abdominal/suprapubic burning with urination.  She has chronic nausea and vomiting which is not changed recently.   Hospital Course:  Principal problem Acute hypoxic respiratory failure/community acquired pneumonia/acutemetabolic encephalopathy /COPD exacerbation-Per family she was febrile to 103 at home, fever resolved, white count is now normalized. Cultures are still negative. Chest x-ray on admission with patchy bilateral opacities more pronounced on the right lower lobe concern for pneumonia.  She was placed on supportive treatment with nebulizers, antibiotics, with clinical improvement.  She returned back to baseline, had no further shortness of breath, however she did require O2 on discharge, she likely has a chronic component of hypoxia in the setting of her COPD.  Additional problems UTI -urine cultures with Citrobacter covered by Levaquin Hypertension -resume home medications Chronic kidney disease stage III -Creatinine has remained stable, close to baseline, avoid nephrotoxins Osteoarthritis, chronic right hip pain -Continue home regimen  Depression, anxiety -Continue home regimen  Hyperlipidemia -Continue statin Iron deficiency anemia -Continue iron supplementation  Discharge Diagnoses:  Principal Problem:   Respiratory failure with hypoxia ([place]) Active Problems:   Hypercholesteremia   GERD (gastroesophageal reflux disease)   Anxiety   CAFL (chronic airflow limitation) (HCC)   Benign essential HTN   Depression, major, in partial remission ([place])   UTI (urinary tract infection)   Long term current use of opiate analgesic   Chronic pain syndrome  Chronic hip pain (Right)   CKD (chronic kidney disease) stage 3, GFR 30-59 ml/min (HCC)   CAP (community acquired pneumonia)   Acute lower UTI   Acute metabolic encephalopathy     Discharge Instructions   Allergies as of 04/25/2018      Reactions   Ampicillin Anaphylaxis, Nausea Only, Swelling   SEVERE  HEADACHE MUSCLE CRAMPS ANGIOEDEMA THRUSH PATIENT HAS HAD A PCN REACTION WITH IMMEDIATE RASH, FACIAL/TONGUE/THROAT SWELLING, SOB, OR LIGHTHEADEDNESS WITH HYPOTENSION:  #  #  YES  #  #  Has patient had a PCN reaction causing severe rash involving mucus membranes or skin necrosis: No Has patient had a PCN reaction that required hospitalization:No Has patient had a PCN reaction occurring within the last 10 years: No.   Ampicillin-sulbactam Sodium Anaphylaxis, Other (See Comments)   SEVERE HEADACHE MUSCLE CRAMPS ANGIOEDEMA THRUSH PATIENT HAS HAD A PCN REACTION WITH IMMEDIATE RASH, FACIAL/TONGUE/THROAT SWELLING, SOB, OR LIGHTHEADEDNESS WITH HYPOTENSION: # # YES # # Has patient had a PCN reaction causing severe rash involving mucus membranes or skin necrosis: No Has patient had a PCN reaction that required hospitalization:No Has patient had a PCN reaction occurring within the last 10 years: No   Ambien [zolpidem Tartrate] Nausea And Vomiting, Other (See Comments)   HALLUCINATIONS SWEATING   Flexeril [cyclobenzaprine] Other (See Comments)   EXTRAPYRAMIDAL MOVEMENT INVOLUNTARY MUSCLE JERKING   Tape Itching, Rash, Other (See Comments)   Paper tape only. Adhesive tape=itching/burning/rash   Toradol [ketorolac Tromethamine] Nausea Only, Other (See Comments)   HEADACHE  BACKACHE   Sulfamethoxazole-trimethoprim    UNSPECIFIED REACTION    Zolpidem    Hallucinations   Cephalexin Rash, Other (See Comments)   HEADACHES   Tramadol Rash, Other (See Comments)   HEADACHE       Medication List    TAKE these medications   albuterol 108 (90 Base) MCG/ACT inhaler Commonly known as:  PROVENTIL HFA;VENTOLIN HFA Inhale 2 puffs into the lungs every 6 (six) hours as needed for wheezing or shortness of breath.   albuterol 1.25 MG/3ML nebulizer solution Commonly known as:  ACCUNEB Take 1 ampule by nebulization every 6 (six) hours as needed for wheezing.   alendronate 70 MG tablet Commonly known as:   FOSAMAX Take 70 mg by mouth every Monday. Take with a full glass of water on an empty stomach.   ALPRAZolam 1 MG tablet Commonly known as:  XANAX Take 1 mg by mouth at bedtime as needed for sleep.   amLODipine 5 MG tablet Commonly known as:  NORVASC Take 5 mg by mouth daily.   BIOFREEZE EX Apply 1 application topically daily as needed (muscle pain).   Calcium 500-100 MG-UNIT Chew Chew 1 tablet by mouth daily.   CALCIUM/D3 ADULT GUMMIES PO Take 1 tablet by mouth daily.   citalopram 20 MG tablet Commonly known as:  CELEXA Take 20 mg by mouth daily.   diclofenac sodium 1 % Gel Commonly known as:  VOLTAREN Apply 2 g topically 4 (four) times daily.   docusate sodium 100 MG capsule Commonly known as:  COLACE Take 200 mg by mouth 2 (two) times daily as needed for moderate constipation.   esomeprazole 40 MG capsule Commonly known as:  NEXIUM Take 40 mg by mouth daily.   fluticasone 220 MCG/ACT inhaler Commonly known as:  FLOVENT HFA Inhale 1 puff into the lungs 2 (two) times daily.   Iron 325 (65 Fe) MG Tabs Take 1 tablet (325 mg total) by mouth 2 (two) times  daily. What changed:  when to take this   Krill Oil 500 MG Caps Take 1 capsule by mouth daily.   levofloxacin 750 MG tablet Commonly known as:  LEVAQUIN Take 1 tablet (750 mg total) by mouth every other day.   lidocaine 5 % Commonly known as:  LIDODERM Place 1 patch onto the skin every 12 (twelve) hours. Remove & Discard patch within 12 hours or as directed by MD   LINZESS 290 MCG Caps capsule Generic drug:  linaclotide Take 290 mcg by mouth daily.   lovastatin 10 MG tablet Commonly known as:  MEVACOR Take 10 mg by mouth daily.   meloxicam 15 MG tablet Commonly known as:  MOBIC Take 15 mg by mouth daily.   MUCINEX 600 MG 12 hr tablet Generic drug:  guaiFENesin Take 600 mg by mouth 2 (two) times daily as needed for cough.   ondansetron 4 MG disintegrating tablet Commonly known as:  ZOFRAN-ODT Take  1 tablet (4 mg total) by mouth every 8 (eight) hours as needed for nausea or vomiting.   oxyCODONE-acetaminophen 5-325 MG tablet Commonly known as:  PERCOCET/ROXICET Take 1 tablet by mouth every 6 (six) hours as needed for severe pain.   polyethylene glycol packet Commonly known as:  MIRALAX / GLYCOLAX Take 17 g by mouth daily as needed (for constipation.).   Potassium 95 MG Tabs Take 1 tablet by mouth daily.   pregabalin 75 MG capsule Commonly known as:  LYRICA Take 1 capsule (75 mg total) by mouth 2 (two) times daily.   traZODone 100 MG tablet Commonly known as:  DESYREL Take 100 mg by mouth at bedtime.   vitamin B-12 500 MCG tablet Commonly known as:  CYANOCOBALAMIN Take 500 mcg by mouth daily.   VITAMIN D HIGH POTENCY PO Take 5,000 Units by mouth daily.            Durable Medical Equipment  (From admission, onward)         Start     Ordered   04/24/18 0946  For home use only DME oxygen  Once    Question Answer Comment  Mode or (Route) Nasal cannula   Liters per Minute 3   Frequency Continuous (stationary and portable oxygen unit needed)   Oxygen delivery system Gas      04/24/18 0945         Follow-up Information    Alvester Chou, NP. Schedule an appointment as soon as possible for a visit in 1 week(s).   Specialty:  Nurse Practitioner Contact information: [facility] [place] [address] [phone]        Home, [facility] Follow up.   Specialty:  [place] Why:  They will do your home health care at your home Contact information: [address] [place] [address] [place] Follow up.   Why:  They will deliver oxygen tank/ concentrator to your home Contact information: [address] [place] [address] [phone]           Consultations:  None   Procedures/Studies:  Dg Chest 2 View  Result Date:  04/20/2018 CLINICAL DATA:  Chest pain, shortness of Breath EXAM: CHEST - 2 VIEW COMPARISON:  11/19/2016 FINDINGS: Cardiomegaly. Patchy bilateral airspace opacities, right greater than left. This is most confluent in the right lung base. Cannot exclude pneumonia. No effusions or acute bony  abnormality. IMPRESSION: Cardiomegaly. Patchy bilateral opacities, most pronounced in the right lower lobe concerning for pneumonia. Electronically Signed   By: Rolm Baptise M.D.   On: 04/20/2018 11:28   Dg Lumbar Spine Complete  Result Date: 03/29/2018 CLINICAL DATA:  Fall with right hip pain. EXAM: LUMBAR SPINE - COMPLETE 4+ VIEW COMPARISON:  03/26/2018 lumbar spine MRI FINDINGS: This report assumes 5 non rib-bearing lumbar vertebrae. Lumbar vertebral body heights are stable from recent MRI, with no fracture. Mild multilevel lumbar degenerative disc disease, most prominent at L1-2 and L3-4. No spondylolisthesis. No significant facet arthropathy. No aggressive appearing focal osseous lesions. Abdominal aortic atherosclerosis. Partially visualized right total hip arthroplasty. IMPRESSION: 1. No acute osseous abnormality. 2. Mild multilevel lumbar degenerative disc disease. Electronically Signed   By: Ilona Sorrel M.D.   On: 03/29/2018 19:41   Dg Chest Port 1 View  Result Date: 04/22/2018 CLINICAL DATA:  Cough and shortness of breath with hypoxia EXAM: PORTABLE CHEST 1 VIEW COMPARISON:  April 20, 2018 FINDINGS: Patchy infiltrate is seen bilaterally, most notably in the right lower lung zone medially. Heart is upper normal in size with pulmonary vascularity normal. No adenopathy. No bone lesions. IMPRESSION: Patchy airspace opacity, likely multifocal pneumonia. There may be a degree of underlying fibrosis as well. Stable cardiac silhouette. No new opacity. Electronically Signed   By: Lowella Grip III M.D.   On: 04/22/2018 12:29   Dg C-arm 1-60 Min-no Report  Result Date: 04/07/2018 Fluoroscopy was utilized by  the requesting physician.  No radiographic interpretation.   Dg Hip Unilat  With Pelvis 2-3 Views Right  Result Date: 04/17/2018 CLINICAL DATA:  Worsening right hip pain following right hip surgery in May 2019. EXAM: DG HIP (WITH OR WITHOUT PELVIS) 2-3V RIGHT COMPARISON:  03/29/2018. FINDINGS: Stable right total hip prosthesis. No fracture or dislocation seen. Diffuse osteopenia. IMPRESSION: 1. No acute abnormality. 2. Hardware intact. Electronically Signed   By: Claudie Revering M.D.   On: 04/17/2018 11:59   Dg Hip Unilat W Or Wo Pelvis 2-3 Views Right  Result Date: 03/29/2018 CLINICAL DATA:  Right hip pain. Fall a few days ago. Replacement 6 months ago. EXAM: DG HIP (WITH OR WITHOUT PELVIS) 2-3V RIGHT COMPARISON:  February 09, 2018 FINDINGS: The patient is status post right hip replacement. Acetabular and femoral components are in good position. No dislocation. No fractures identified. No evidence of hardware failure. IMPRESSION: Right hip replacement. No acute fracture noted. No evidence of hardware failure. Electronically Signed   By: Dorise Bullion III M.D   On: 03/29/2018 18:27      Subjective: - no chest pain, shortness of breath, no abdominal pain, nausea or vomiting. Wants to go home   Discharge Exam: Vitals:   04/25/18 0739 04/25/18 0755  BP: 105/68   Pulse: (!) 109   Resp: 12   Temp: 98.8 F (37.1 C)   SpO2: 90% 92%    General: Pt is alert, awake, not in acute distress Cardiovascular: RRR, S1/S2 +, no rubs, no gallops Respiratory: CTA bilaterally, no wheezing, no rhonchi Abdominal: Soft, NT, ND, bowel sounds + Extremities: no edema, no cyanosis    The results of significant diagnostics from this hospitalization (including imaging, microbiology, ancillary and laboratory) are listed below for reference.     Microbiology: Recent Results (from the past 240 hour(s))  MRSA PCR Screening     Status: None   Collection Time: 04/20/18  4:14 PM  Result Value Ref Range Status    MRSA by  PCR NEGATIVE NEGATIVE Final    Comment:        The GeneXpert MRSA Assay (FDA approved for NASAL specimens only), is one component of a comprehensive MRSA colonization surveillance program. It is not intended to diagnose MRSA infection nor to guide or monitor treatment for MRSA infections. Performed at [facility], [place] [address], [place], [place] [address]   Culture, blood (routine x 2)     Status: None   Collection Time: 04/20/18  9:45 PM  Result Value Ref Range Status   Specimen Description BLOOD LEFT HAND  Final   Special Requests   Final    BOTTLES DRAWN AEROBIC ONLY Blood Culture adequate volume   Culture   Final    NO GROWTH 5 DAYS Performed at [facility], [place] [address] [place], [place] [address]    Report Status 04/25/2018 FINAL  Final  Culture, blood (routine x 2)     Status: None   Collection Time: 04/20/18  9:50 PM  Result Value Ref Range Status   Specimen Description BLOOD LEFT HAND  Final   Special Requests   Final    BOTTLES DRAWN AEROBIC ONLY Blood Culture adequate volume   Culture   Final    NO GROWTH 5 DAYS Performed at [facility], [place] [address], [place], [place] [address]    Report Status 04/25/2018 FINAL  Final  Culture, Urine     Status: Abnormal   Collection Time: 04/21/18  6:05 PM  Result Value Ref Range Status   Specimen Description URINE, RANDOM  Final   Special Requests   Final    NONE Performed at [facility], [place] [address], [place], [place] [address]    Culture (A)  Final    >=100,000 COLONIES/mL VIRIDANS STREPTOCOCCUS 70,000 COLONIES/mL CITROBACTER FREUNDII    Report Status 04/24/2018 FINAL  Final   Organism ID, Bacteria CITROBACTER FREUNDII (A)  Final      Susceptibility   Citrobacter freundii - MIC*    CEFAZOLIN >=64 RESISTANT Resistant     CEFTRIAXONE <=1 SENSITIVE Sensitive     CIPROFLOXACIN <=0.25 SENSITIVE Sensitive     GENTAMICIN <=1 SENSITIVE Sensitive     IMIPENEM <=0.25 SENSITIVE  Sensitive     NITROFURANTOIN <=16 SENSITIVE Sensitive     TRIMETH/SULFA <=20 SENSITIVE Sensitive     PIP/TAZO <=4 SENSITIVE Sensitive     * 70,000 COLONIES/mL CITROBACTER FREUNDII     Labs: BNP (last 3 results) No results for input(s): BNP in the last 8760 hours. Basic Metabolic Panel: Recent Labs  Lab 04/20/18 1042 04/21/18 0208 04/22/18 1248 04/24/18 0722  NA 139 140 140 139  K 3.6 4.6 4.2 3.8  CL 109 111 111 107  CO2 24 24 21* 24  GLUCOSE 95 84 95 87  BUN 16 15 16 23   CREATININE 1.27* 1.25* 1.11* 1.17*  CALCIUM 8.6* 8.8* 8.9 9.0   Liver Function Tests: Recent Labs  Lab 04/20/18 1042 04/22/18 1248  AST 24 23  ALT 51* 28  ALKPHOS 52 54  BILITOT 0.7 0.6  PROT 5.8* 5.8*  ALBUMIN 2.6* 2.1*   No results for input(s): LIPASE, AMYLASE in the last 168 hours. No results for input(s): AMMONIA in the last 168 hours. CBC: Recent Labs  Lab 04/20/18 1042 04/21/18 0208 04/22/18 1053  WBC 14.6* 13.7* 8.7  NEUTROABS 12.2*  --  7.1  HGB 12.4 12.0 10.9*  HCT 40.7 40.4 36.3  MCV 98.8 99.3 98.9  PLT 133* 137* 159   Cardiac Enzymes: Recent Labs  Lab 04/20/18 1042  TROPONINI <0.03   BNP: Invalid input(s): POCBNP CBG: Recent Labs  Lab 04/21/18 1641  GLUCAP 177*   D-Dimer No results for input(s): DDIMER in the last 72 hours. Hgb A1c No results for input(s): HGBA1C in the last 72 hours. Lipid Profile No results for input(s): CHOL, HDL, LDLCALC, TRIG, CHOLHDL, LDLDIRECT in the last 72 hours. Thyroid function studies No results for input(s): TSH, T4TOTAL, T3FREE, THYROIDAB in the last 72 hours.  Invalid input(s): FREET3 Anemia work up No results for input(s): VITAMINB12, FOLATE, FERRITIN, TIBC, IRON, RETICCTPCT in the last 72 hours. Urinalysis    Component Value Date/Time   COLORURINE YELLOW 04/20/2018 1416   APPEARANCEUR CLOUDY (A) 04/20/2018 1416   LABSPEC 1.009 04/20/2018 1416   PHURINE 6.0 04/20/2018 1416   GLUCOSEU NEGATIVE 04/20/2018 1416   HGBUR  NEGATIVE 04/20/2018 1416   BILIRUBINUR NEGATIVE 04/20/2018 1416   KETONESUR NEGATIVE 04/20/2018 1416   PROTEINUR NEGATIVE 04/20/2018 1416   UROBILINOGEN 0.2 02/20/2015 0605   NITRITE NEGATIVE 04/20/2018 1416   LEUKOCYTESUR LARGE (A) 04/20/2018 1416   Sepsis Labs Invalid input(s): PROCALCITONIN,  WBC,  LACTICIDVEN   Time coordinating discharge: 40 minutes  SIGNED:  Marzetta Board, MD  [facility] 04/25/2018, 3:50 PM Pager [phone]  If 7PM-7AM, please contact night-coverage [email or web address] Password [email or web address]

## 2018-04-26 NOTE — Progress Notes (Deleted)
Patient's Name: [name]  MRN: [id number]  Referring Provider: Alvester Chou, NP  DOB: [birth year]  PCP: Alvester Chou, NP  DOS: 04/27/2018  Note by: Gaspar Cola, MD  Service setting: Ambulatory outpatient  Specialty: Interventional Pain Management  Location: [facility] (AMB) Pain Management Facility    Patient type: Established   Primary Reason(s) for Visit: Encounter for post-procedure evaluation of chronic illness with mild to moderate exacerbation CC: No chief complaint on file.  HPI  [name] is a 77 y.o. year old, female patient, who comes today for a post-procedure evaluation. 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[place]); Hypertensive kidney disease, malignant; Chronic low back pain (Tertiary Area of Pain) (Bilateral) (L>R); CAFL (chronic airflow limitation) ([place]); Apoplectic vertigo; Duodenogastric reflux; History of stroke; Hoarseness; Benign essential HTN; Chronic knee pain (Primary Area of Pain) (Right); Depression, major, in partial remission ([place]); Major depression in remission ([place]); Involutional osteoporosis; Pharyngoesophageal dysphagia; 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); Hx of total knee replacement (Bilateral); Infection of the upper respiratory tract; Bed wetting; UTI (urinary tract infection); Long term current use of opiate analgesic; Long term prescription opiate use; Chronic pain syndrome; Chronic neck pain (Secondary Area of Pain) (Bilateral) (L>R); Chronic hip pain (Right); Rib pain; DDD (degenerative disc disease), cervical; Lumbar facet arthropathy (Bilateral); Chronic myofascial  pain; Lumbar facet syndrome (Bilateral) (L>R); Long term prescription benzodiazepine use; Opiate use; Disorder of skeletal system; Pharmacologic therapy; Problems influencing health status; Opioid-induced constipation (OIC); NSAID long-term use; DDD (degenerative disc disease), lumbar; Chronic knee pain (Bilateral) (R>L); Osteoarthritis; Chronic musculoskeletal pain; Neurogenic pain; Chronic knee pain s/p total knee replacement (TKR) (Right); CKD (chronic kidney disease) stage 3, GFR 30-59 ml/min ([place]); Chronic shoulder pain (Left); Non-traumatic compression fracture of T3 thoracic vertebra, sequela; High risk medication use; At high risk for falls; Chronic sacroiliac joint pain (Right); Spondylosis without myelopathy or radiculopathy, lumbosacral region; Other specified dorsopathies, sacral and sacrococcygeal region; Abnormal x-ray of pelvis; SBO (small bowel obstruction) ([place]); History of small bowel obstruction; Delayed union of closed fracture of hip, right; Primary osteoarthritis of hip; Ileus ([place]); It band syndrome, right; History of hip replacement (Right); Closed hip fracture, sequela (Right); Acute right hip pain; Chronic lower extremity pain (Right); Abnormal MRI, lumbar spine; Lumbar facet hypertrophy (Multilevel) (Bilateral); Lumbar foraminal stenosis (L3-4, L4-5) (Bilateral); Annular tear of lumbar disc (L3-4, L4-5); Respiratory failure with hypoxia ([place]); CAP (community acquired pneumonia); Acute lower UTI; and Acute metabolic encephalopathy on their problem list. Her primarily concern today is the No chief complaint on file.  Pain Assessment: Location:     Radiating:   Onset:   Duration:   Quality:   Severity:  /10 (subjective, self-reported pain score)  Note: Reported level is compatible with observation.                         When using our objective Pain Scale, levels between 6 and 10/10 are said to belong in an emergency room, as it progressively worsens from a 6/10, described as  severely limiting, requiring emergency care not usually available at an outpatient pain management facility. At a 6/10 level, communication becomes difficult and requires great effort. Assistance to reach the emergency department may be required. Facial  flushing and profuse sweating along with potentially dangerous increases in heart rate and blood pressure will be evident. Effect on ADL:   Timing:   Modifying factors:   BP:    HR:    [name] comes in today for post-procedure evaluation.  Further details on both, my assessment(s), as well as the proposed treatment plan, please see below.  Post-Procedure Assessment  04/13/2018 Procedure: Diagnostic right-sided L4 transforaminal ESI #1 + right-sided L3-4 interlaminar LESI #1 under fluoroscopic guidance and IV sedation Pre-procedure pain score:  6/10 Post-procedure pain score: 8/10 No relief Influential Factors: BMI:   Intra-procedural challenges: None observed.         Assessment challenges: None detected.              Reported side-effects: None.        Post-procedural adverse reactions or complications: None reported         Sedation: Sedation provided. When no sedatives are used, the analgesic levels obtained are directly associated to the effectiveness of the local anesthetics. However, when sedation is provided, the level of analgesia obtained during the initial 1 hour following the intervention, is believed to be the result of a combination of factors. These factors may include, but are not limited to: 1. The effectiveness of the local anesthetics used. 2. The effects of the analgesic(s) and/or anxiolytic(s) used. 3. The degree of discomfort experienced by the patient at the time of the procedure. 4. The patients ability and reliability in recalling and recording the events. 5. The presence and influence of possible secondary gains and/or psychosocial factors. Reported result: Relief experienced during the 1st hour after the  procedure:   (Ultra-Short Term Relief)            Interpretative annotation: Clinically appropriate result. Analgesia during this period is likely to be Local Anesthetic and/or IV Sedative (Analgesic/Anxiolytic) related.          Effects of local anesthetic: The analgesic effects attained during this period are directly associated to the localized infiltration of local anesthetics and therefore cary significant diagnostic value as to the etiological location, or anatomical origin, of the pain. Expected duration of relief is directly dependent on the pharmacodynamics of the local anesthetic used. Long-acting (4-6 hours) anesthetics used.  Reported result: Relief during the next 4 to 6 hour after the procedure:   (Short-Term Relief)            Interpretative annotation: Clinically appropriate result. Analgesia during this period is likely to be Local Anesthetic-related.          Long-term benefit: Defined as the period of time past the expected duration of local anesthetics (1 hour for short-acting and 4-6 hours for long-acting). With the possible exception of prolonged sympathetic blockade from the local anesthetics, benefits during this period are typically attributed to, or associated with, other factors such as analgesic sensory neuropraxia, antiinflammatory effects, or beneficial biochemical changes provided by agents other than the local anesthetics.  Reported result: Extended relief following procedure:   (Long-Term Relief)            Interpretative annotation: Clinically possible results. Good relief. No permanent benefit expected. Inflammation plays a part in the etiology to the pain.          Current benefits: Defined as reported results that persistent at this point in time.   Analgesia: *** %            Function: Somewhat improved ROM: Somewhat improved Interpretative annotation: Recurrence of  symptoms. No permanent benefit expected. Effective diagnostic intervention.           Interpretation: Results would suggest a successful diagnostic intervention.                  Plan:  Please see "Plan of Care" for details.                Laboratory Chemistry  Inflammation Markers (CRP: Acute Phase) (ESR: Chronic Phase) Lab Results  Component Value Date   CRP <0.8 11/19/2016   ESRSEDRATE 26 (H) 02/09/2018   LATICACIDVEN 1.72 10/26/2017                         Rheumatology Markers No results found.  Renal Markers Lab Results  Component Value Date   BUN 23 04/24/2018   CREATININE 1.17 (H) 04/24/2018   BCR 6 (L) 12/04/2016   GFRAA 52 (L) 04/24/2018   GFRNONAA 45 (L) 04/24/2018                             Hepatic Markers Lab Results  Component Value Date   AST 23 04/22/2018   ALT 28 04/22/2018   ALBUMIN 2.1 (L) 04/22/2018                        Neuropathy Markers Lab Results  Component Value Date   VITAMINB12 1,888 (H) 11/19/2016   HGBA1C 4.9 09/19/2017   HIV Non Reactive 04/21/2018                        Hematology Parameters Lab Results  Component Value Date   INR 1.00 09/13/2016   LABPROT 13.2 09/13/2016   APTT 34 01/04/2011   PLT 159 04/22/2018   HGB 10.9 (L) 04/22/2018   HCT 36.3 04/22/2018   DDIMER 1.71 (H) 10/23/2016                        CV Markers Lab Results  Component Value Date   BNP 60.2 01/23/2016   TROPONINI <0.03 04/20/2018                         Note: Lab results reviewed.  Recent Imaging Results   Results for orders placed in visit on 04/07/18  DG C-Arm 1-60 Min-No Report   Narrative Fluoroscopy was utilized by the requesting physician.  No radiographic  interpretation.    Interpretation Report: Fluoroscopy was used during the procedure to assist with needle guidance. The images were interpreted intraoperatively by the requesting physician.  Meds   Current Outpatient Medications:  .  albuterol (ACCUNEB) 1.25 MG/3ML nebulizer solution, Take 1 ampule by nebulization every 6 (six) hours as needed for  wheezing., Disp: , Rfl:  .  albuterol (PROVENTIL HFA;VENTOLIN HFA) 108 (90 BASE) MCG/ACT inhaler, Inhale 2 puffs into the lungs every 6 (six) hours as needed for wheezing or shortness of breath. , Disp: , Rfl:  .  alendronate (FOSAMAX) 70 MG tablet, Take 70 mg by mouth every Monday. Take with a full glass of water on an empty stomach., Disp: , Rfl:  .  ALPRAZolam (XANAX) 1 MG tablet, Take 1 mg by mouth at bedtime as needed for sleep., Disp: , Rfl:  .  amLODipine (NORVASC) 5 MG tablet, Take 5 mg by mouth daily. , Disp: , Rfl:  .  Calcium 500-100 MG-UNIT CHEW, Chew 1 tablet by mouth daily., Disp: , Rfl:  .  Calcium-Phosphorus-Vitamin D (CALCIUM/D3 ADULT GUMMIES PO), Take 1 tablet by mouth daily., Disp: , Rfl:  .  Cholecalciferol (VITAMIN D HIGH POTENCY PO), Take 5,000 Units by mouth daily., Disp: , Rfl:  .  citalopram (CELEXA) 20 MG tablet, Take 20 mg by mouth daily. , Disp: , Rfl:  .  diclofenac sodium (VOLTAREN) 1 % GEL, Apply 2 g topically 4 (four) times daily. (Patient not taking: Reported on 04/20/2018), Disp: 100 g, Rfl: 0 .  docusate sodium (COLACE) 100 MG capsule, Take 200 mg by mouth 2 (two) times daily as needed for moderate constipation. , Disp: , Rfl:  .  esomeprazole (NEXIUM) 40 MG capsule, Take 40 mg by mouth daily. , Disp: , Rfl:  .  Ferrous Sulfate (IRON) 325 (65 Fe) MG TABS, Take 1 tablet (325 mg total) by mouth 2 (two) times daily. (Patient taking differently: Take 1 tablet by mouth 2 (two) times a week. ), Disp: 60 each, Rfl: 0 .  fluticasone (FLOVENT HFA) 220 MCG/ACT inhaler, Inhale 1 puff into the lungs 2 (two) times daily., Disp: , Rfl:  .  guaiFENesin (MUCINEX) 600 MG 12 hr tablet, Take 600 mg by mouth 2 (two) times daily as needed for cough., Disp: , Rfl:  .  Krill Oil 500 MG CAPS, Take 1 capsule by mouth daily., Disp: , Rfl:  .  levofloxacin (LEVAQUIN) 750 MG tablet, Take 1 tablet (750 mg total) by mouth every other day., Disp: 3 tablet, Rfl: 0 .  lidocaine (LIDODERM) 5 %,  Place 1 patch onto the skin every 12 (twelve) hours. Remove & Discard patch within 12 hours or as directed by MD (Patient not taking: Reported on 04/20/2018), Disp: 10 patch, Rfl: 0 .  linaclotide (LINZESS) 290 MCG CAPS capsule, Take 290 mcg by mouth daily., Disp: , Rfl:  .  lovastatin (MEVACOR) 10 MG tablet, Take 10 mg by mouth daily., Disp: , Rfl:  .  meloxicam (MOBIC) 15 MG tablet, Take 15 mg by mouth daily. , Disp: , Rfl:  .  Menthol, Topical Analgesic, (BIOFREEZE EX), Apply 1 application topically daily as needed (muscle pain)., Disp: , Rfl:  .  ondansetron (ZOFRAN ODT) 4 MG disintegrating tablet, Take 1 tablet (4 mg total) by mouth every 8 (eight) hours as needed for nausea or vomiting., Disp: 20 tablet, Rfl: 0 .  oxyCODONE-acetaminophen (PERCOCET) 5-325 MG tablet, Take 1 tablet by mouth every 6 (six) hours as needed for severe pain., Disp: 15 tablet, Rfl: 0 .  polyethylene glycol (MIRALAX / GLYCOLAX) packet, Take 17 g by mouth daily as needed (for constipation.). , Disp: , Rfl:  .  Potassium 95 MG TABS, Take 1 tablet by mouth daily., Disp: , Rfl:  .  pregabalin (LYRICA) 75 MG capsule, Take 1 capsule (75 mg total) by mouth 2 (two) times daily., Disp: 60 capsule, Rfl: 5 .  traZODone (DESYREL) 100 MG tablet, Take 100 mg by mouth at bedtime. , Disp: , Rfl:  .  vitamin B-12 (CYANOCOBALAMIN) 500 MCG tablet, Take 500 mcg by mouth daily., Disp: , Rfl:   ROS  Constitutional: Denies any fever or chills Gastrointestinal: No reported hemesis, hematochezia, vomiting, or acute GI distress Musculoskeletal: Denies any acute onset joint swelling, redness, loss of ROM, or weakness Neurological: No reported episodes of acute onset apraxia, aphasia, dysarthria, agnosia, amnesia, paralysis, loss of coordination, or loss of consciousness  Allergies  Ms. Kerce is allergic to  ampicillin; ampicillin-sulbactam sodium; ambien [zolpidem tartrate]; flexeril [cyclobenzaprine]; tape; toradol [ketorolac tromethamine];  sulfamethoxazole-trimethoprim; zolpidem; cephalexin; and tramadol.  [place]  Drug: Ms. Tidd  reports that she does not use drugs. Alcohol:  reports that she does not drink alcohol. Tobacco:  reports that she quit smoking about 6 years ago. Her smoking use included e-cigarettes and cigarettes. She has a 45.00 pack-year smoking history. She has never used smokeless tobacco. Medical:  has a past medical history of Acute postoperative pain (02/03/2018), Anemia, B12 deficiency, Bacteremia (10/07/2014), Bladder incontinence, Blind left eye, Cancer ([place]), Cataract, Cervical spine fracture ([place]), Chest pain (07/29/2013), Compression fracture, COPD (chronic obstructive pulmonary disease) ([place]), DDD (degenerative disc disease), lumbar, Depression, Diffuse myofascial pain syndrome (03/24/2015), Dyspnea, Fever (10/05/2014), GERD (gastroesophageal reflux disease), Hiatal hernia, Hyperlipidemia, Hypertension, Hypertensive kidney disease, malignant (11/07/2016), Macular degeneration, Osteoarthritis, Osteoporosis, Pneumonia, Reactive airway disease, Rupture of bowel ([place]), Sepsis ([place]) (10/08/2014), Stroke [facility]), Stroke ([place]), and Wrist pain, acute (09/10/2012). Surgical: Ms. Suppa  has a past surgical history that includes Abdominal surgery; Colon surgery; Appendectomy; Bladder surgery; Rectocele repair; Knee surgery; Cholecystectomy; Abdominal hysterectomy; Ostomy; Eye surgery; Intramedullary (im) nail intertrochanteric (Right, 09/13/2016); Hip surgery (Right, 08/2016); Joint replacement (Bilateral); Toe Surgery (Right); Colostomy reversal; and Total hip arthroplasty (Right, 09/30/2017). Family: family history includes Heart attack in her father; Heart failure in her mother.  Constitutional Exam  General appearance: Well nourished, well developed, and well hydrated. In no apparent acute distress There were no vitals filed for this visit. BMI Assessment: Estimated body mass index is 32.92 kg/m as calculated from the  following:   Height as of 04/20/18: '5\' 2"'  (1.575 m).   Weight as of 04/20/18: 180 lb 0.1 oz (81.7 kg).  BMI interpretation table: BMI level Category Range association with higher incidence of chronic pain  <18 kg/m2 Underweight   18.5-24.9 kg/m2 Ideal body weight   25-29.9 kg/m2 Overweight Increased incidence by 20%  30-34.9 kg/m2 Obese (Class I) Increased incidence by 68%  35-39.9 kg/m2 Severe obesity (Class II) Increased incidence by 136%  >40 kg/m2 Extreme obesity (Class III) Increased incidence by 254%   Patient's current BMI Ideal Body weight  There is no height or weight on file to calculate BMI. Ideal body weight: 50.1 kg (110 lb 7.2 oz) Adjusted ideal body weight: 62.7 kg (138 lb 4.4 oz)   BMI Readings from Last 4 Encounters:  04/20/18 32.92 kg/m  04/17/18 32.92 kg/m  04/13/18 32.92 kg/m  04/10/18 29.95 kg/m   Wt Readings from Last 4 Encounters:  04/20/18 180 lb 0.1 oz (81.7 kg)  04/17/18 180 lb 0.1 oz (81.7 kg)  04/13/18 180 lb (81.6 kg)  04/10/18 180 lb (81.6 kg)  Psych/Mental status: Alert, oriented x 3 (person, place, & time)       Eyes: PERLA Respiratory: No evidence of acute respiratory distress  Cervical Spine Area Exam  Skin & Axial Inspection: No masses, redness, edema, swelling, or associated skin lesions Alignment: Symmetrical Functional ROM: Unrestricted ROM      Stability: No instability detected Muscle Tone/Strength: Functionally intact. No obvious neuro-muscular anomalies detected. Sensory (Neurological): Unimpaired Palpation: No palpable anomalies              Upper Extremity (UE) Exam    Side: Right upper extremity  Side: Left upper extremity  Skin & Extremity Inspection: Skin color, temperature, and hair growth are WNL. No peripheral edema or cyanosis. No masses, redness, swelling, asymmetry, or associated skin lesions. No contractures.  Skin & Extremity Inspection: Skin color, temperature,  and hair growth are WNL. No peripheral edema or  cyanosis. No masses, redness, swelling, asymmetry, or associated skin lesions. No contractures.  Functional ROM: Unrestricted ROM          Functional ROM: Unrestricted ROM          Muscle Tone/Strength: Functionally intact. No obvious neuro-muscular anomalies detected.  Muscle Tone/Strength: Functionally intact. No obvious neuro-muscular anomalies detected.  Sensory (Neurological): Unimpaired          Sensory (Neurological): Unimpaired          Palpation: No palpable anomalies              Palpation: No palpable anomalies              Provocative Test(s):  Phalen's test: deferred Tinel's test: deferred Apley's scratch test (touch opposite shoulder):  Action 1 (Across chest): deferred Action 2 (Overhead): deferred Action 3 (LB reach): deferred   Provocative Test(s):  Phalen's test: deferred Tinel's test: deferred Apley's scratch test (touch opposite shoulder):  Action 1 (Across chest): deferred Action 2 (Overhead): deferred Action 3 (LB reach): deferred    Thoracic Spine Area Exam  Skin & Axial Inspection: No masses, redness, or swelling Alignment: Symmetrical Functional ROM: Unrestricted ROM Stability: No instability detected Muscle Tone/Strength: Functionally intact. No obvious neuro-muscular anomalies detected. Sensory (Neurological): Unimpaired Muscle strength & Tone: No palpable anomalies  Lumbar Spine Area Exam  Skin & Axial Inspection: No masses, redness, or swelling Alignment: Symmetrical Functional ROM: Unrestricted ROM       Stability: No instability detected Muscle Tone/Strength: Functionally intact. No obvious neuro-muscular anomalies detected. Sensory (Neurological): Unimpaired Palpation: No palpable anomalies       Provocative Tests: Hyperextension/rotation test: deferred today       Lumbar quadrant test (Kemp's test): deferred today       Lateral bending test: deferred today       Patrick's Maneuver: deferred today                   FABER test: deferred today                    S-I anterior distraction/compression test: deferred today         S-I lateral compression test: deferred today         S-I Thigh-thrust test: deferred today         S-I Gaenslen's test: deferred today          Gait & Posture Assessment  Ambulation: Unassisted Gait: Relatively normal for age and body habitus Posture: WNL   Lower Extremity Exam    Side: Right lower extremity  Side: Left lower extremity  Stability: No instability observed          Stability: No instability observed          Skin & Extremity Inspection: Skin color, temperature, and hair growth are WNL. No peripheral edema or cyanosis. No masses, redness, swelling, asymmetry, or associated skin lesions. No contractures.  Skin & Extremity Inspection: Skin color, temperature, and hair growth are WNL. No peripheral edema or cyanosis. No masses, redness, swelling, asymmetry, or associated skin lesions. No contractures.  Functional ROM: Unrestricted ROM                  Functional ROM: Unrestricted ROM                  Muscle Tone/Strength: Functionally intact. No obvious neuro-muscular anomalies detected.  Muscle Tone/Strength: Functionally intact. No  obvious neuro-muscular anomalies detected.  Sensory (Neurological): Unimpaired        Sensory (Neurological): Unimpaired        DTR: Patellar: deferred today Achilles: deferred today Plantar: deferred today  DTR: Patellar: deferred today Achilles: deferred today Plantar: deferred today  Palpation: No palpable anomalies  Palpation: No palpable anomalies   Assessment  Primary Diagnosis & Pertinent Problem List: The primary encounter diagnosis was Chronic knee pain (Primary Area of Pain) (Right). Diagnoses of Chronic neck pain (Secondary Area of Pain) (Bilateral) (L>R) and Chronic low back pain (Tertiary Area of Pain) (Bilateral) (L>R) were also pertinent to this visit.  Status Diagnosis  Controlled Controlled Controlled 1. Chronic knee pain (Primary Area of  Pain) (Right)   2. Chronic neck pain (Secondary Area of Pain) (Bilateral) (L>R)   3. Chronic low back pain [facility] Area of Pain) (Bilateral) (L>R)     Problems updated and reviewed during this visit: No problems updated. Plan of Care  Pharmacotherapy (Medications Ordered): No orders of the defined types were placed in this encounter.  Medications administered today: Kalen L. Mattox had no medications administered during this visit.  Procedure Orders    No procedure(s) ordered today   Lab Orders  No laboratory test(s) ordered today   Imaging Orders  No imaging studies ordered today   Referral Orders  No referral(s) requested today   Interventional management options: Planned, scheduled, and/or pending:   ***   Considering:   Diagnostic right-sided genicular nerve block#2 Possible right-sided genicular nerve RFA Diagnostic right Femoral + Obturator nerve block #1 Possible right Femoral + Obturator nerve RFA Diagnostic left cervical epidural steroid injection Diagnostic bilateral cervical facet blocks Diagnostic bilateral intra-articular shoulder joint injection Diagnostic bilateral suprascapular nerve block Possible bilateral suprascapular nerve RFA Diagnosticright-sidedlumbar facet block #3 Possibleright-sidedlumbar facet RFA Diagnostic right-sided sacroiliac joint block #3 Possible right-sided sacroiliac joint RFA   Palliative PRN treatment(s):   None at this time   Provider-requested follow-up: No follow-ups on file.  Future Appointments  Date Time Provider [place]  04/27/2018  1:15 PM Milinda Pointer, MD [facility] None  04/29/2018  1:30 PM Penumalli, Earlean Polka, MD [facility] None   Primary Care Physician: Alvester Chou, NP Location: [facility] Note by: Gaspar Cola, MD Date: 04/27/2018; Time: 8:13 AM

## 2018-04-27 ENCOUNTER — Ambulatory Visit: Payer: Medicare Other | Admitting: Pain Medicine

## 2018-04-29 ENCOUNTER — Ambulatory Visit: Payer: Medicare Other | Admitting: Diagnostic Neuroimaging

## 2018-04-30 ENCOUNTER — Emergency Department (HOSPITAL_COMMUNITY): Payer: Medicare Other

## 2018-04-30 ENCOUNTER — Encounter (HOSPITAL_COMMUNITY): Payer: Self-pay | Admitting: Emergency Medicine

## 2018-04-30 ENCOUNTER — Other Ambulatory Visit: Payer: Self-pay

## 2018-04-30 ENCOUNTER — Inpatient Hospital Stay (HOSPITAL_COMMUNITY)
Admission: EM | Admit: 2018-04-30 | Discharge: 2018-05-04 | DRG: 175 | Disposition: A | Discharge status: Home Health | Payer: Medicare Other | Attending: Internal Medicine | Admitting: Internal Medicine

## 2018-04-30 DIAGNOSIS — R402363 Coma scale, best motor response, obeys commands, at hospital admission: Secondary | ICD-10-CM | POA: Diagnosis present

## 2018-04-30 DIAGNOSIS — M5136 Other intervertebral disc degeneration, lumbar region: Secondary | ICD-10-CM | POA: Diagnosis present

## 2018-04-30 DIAGNOSIS — F325 Major depressive disorder, single episode, in full remission: Secondary | ICD-10-CM | POA: Diagnosis present

## 2018-04-30 DIAGNOSIS — N189 Chronic kidney disease, unspecified: Secondary | ICD-10-CM | POA: Diagnosis present

## 2018-04-30 DIAGNOSIS — Z885 Allergy status to narcotic agent status: Secondary | ICD-10-CM

## 2018-04-30 DIAGNOSIS — I2609 Other pulmonary embolism with acute cor pulmonale: Principal | ICD-10-CM

## 2018-04-30 DIAGNOSIS — J189 Pneumonia, unspecified organism: Secondary | ICD-10-CM

## 2018-04-30 DIAGNOSIS — R402253 Coma scale, best verbal response, oriented, at hospital admission: Secondary | ICD-10-CM | POA: Diagnosis present

## 2018-04-30 DIAGNOSIS — L8990 Pressure ulcer of unspecified site, unspecified stage: Secondary | ICD-10-CM

## 2018-04-30 DIAGNOSIS — E1159 Type 2 diabetes mellitus with other circulatory complications: Secondary | ICD-10-CM | POA: Diagnosis not present

## 2018-04-30 DIAGNOSIS — G92 Toxic encephalopathy: Secondary | ICD-10-CM | POA: Diagnosis present

## 2018-04-30 DIAGNOSIS — M810 Age-related osteoporosis without current pathological fracture: Secondary | ICD-10-CM | POA: Diagnosis present

## 2018-04-30 DIAGNOSIS — M47819 Spondylosis without myelopathy or radiculopathy, site unspecified: Secondary | ICD-10-CM | POA: Diagnosis present

## 2018-04-30 DIAGNOSIS — H5462 Unqualified visual loss, left eye, normal vision right eye: Secondary | ICD-10-CM | POA: Diagnosis present

## 2018-04-30 DIAGNOSIS — J449 Chronic obstructive pulmonary disease, unspecified: Secondary | ICD-10-CM | POA: Diagnosis present

## 2018-04-30 DIAGNOSIS — R402143 Coma scale, eyes open, spontaneous, at hospital admission: Secondary | ICD-10-CM | POA: Diagnosis present

## 2018-04-30 DIAGNOSIS — Z880 Allergy status to penicillin: Secondary | ICD-10-CM | POA: Diagnosis not present

## 2018-04-30 DIAGNOSIS — J9610 Chronic respiratory failure, unspecified whether with hypoxia or hypercapnia: Secondary | ICD-10-CM | POA: Diagnosis present

## 2018-04-30 DIAGNOSIS — E785 Hyperlipidemia, unspecified: Secondary | ICD-10-CM | POA: Diagnosis present

## 2018-04-30 DIAGNOSIS — Z96651 Presence of right artificial knee joint: Secondary | ICD-10-CM | POA: Diagnosis present

## 2018-04-30 DIAGNOSIS — Z91048 Other nonmedicinal substance allergy status: Secondary | ICD-10-CM | POA: Diagnosis not present

## 2018-04-30 DIAGNOSIS — I361 Nonrheumatic tricuspid (valve) insufficiency: Secondary | ICD-10-CM | POA: Diagnosis not present

## 2018-04-30 DIAGNOSIS — E1069 Type 1 diabetes mellitus with other specified complication: Secondary | ICD-10-CM

## 2018-04-30 DIAGNOSIS — Z8673 Personal history of transient ischemic attack (TIA), and cerebral infarction without residual deficits: Secondary | ICD-10-CM

## 2018-04-30 DIAGNOSIS — M150 Primary generalized (osteo)arthritis: Secondary | ICD-10-CM | POA: Diagnosis not present

## 2018-04-30 DIAGNOSIS — Z9981 Dependence on supplemental oxygen: Secondary | ICD-10-CM | POA: Diagnosis not present

## 2018-04-30 DIAGNOSIS — Z792 Long term (current) use of antibiotics: Secondary | ICD-10-CM

## 2018-04-30 DIAGNOSIS — I129 Hypertensive chronic kidney disease with stage 1 through stage 4 chronic kidney disease, or unspecified chronic kidney disease: Secondary | ICD-10-CM | POA: Diagnosis present

## 2018-04-30 DIAGNOSIS — I2699 Other pulmonary embolism without acute cor pulmonale: Secondary | ICD-10-CM | POA: Diagnosis not present

## 2018-04-30 DIAGNOSIS — M1990 Unspecified osteoarthritis, unspecified site: Secondary | ICD-10-CM | POA: Diagnosis present

## 2018-04-30 DIAGNOSIS — Z886 Allergy status to analgesic agent status: Secondary | ICD-10-CM

## 2018-04-30 DIAGNOSIS — F419 Anxiety disorder, unspecified: Secondary | ICD-10-CM | POA: Diagnosis present

## 2018-04-30 DIAGNOSIS — Z882 Allergy status to sulfonamides status: Secondary | ICD-10-CM

## 2018-04-30 DIAGNOSIS — Z791 Long term (current) use of non-steroidal anti-inflammatories (NSAID): Secondary | ICD-10-CM

## 2018-04-30 DIAGNOSIS — Z87891 Personal history of nicotine dependence: Secondary | ICD-10-CM

## 2018-04-30 DIAGNOSIS — Z7983 Long term (current) use of bisphosphonates: Secondary | ICD-10-CM

## 2018-04-30 DIAGNOSIS — I82403 Acute embolism and thrombosis of unspecified deep veins of lower extremity, bilateral: Secondary | ICD-10-CM | POA: Diagnosis present

## 2018-04-30 DIAGNOSIS — K219 Gastro-esophageal reflux disease without esophagitis: Secondary | ICD-10-CM | POA: Diagnosis present

## 2018-04-30 DIAGNOSIS — Z96641 Presence of right artificial hip joint: Secondary | ICD-10-CM | POA: Diagnosis present

## 2018-04-30 DIAGNOSIS — M8949 Other hypertrophic osteoarthropathy, multiple sites: Secondary | ICD-10-CM | POA: Diagnosis present

## 2018-04-30 DIAGNOSIS — M47817 Spondylosis without myelopathy or radiculopathy, lumbosacral region: Secondary | ICD-10-CM | POA: Diagnosis present

## 2018-04-30 DIAGNOSIS — I10 Essential (primary) hypertension: Secondary | ICD-10-CM

## 2018-04-30 DIAGNOSIS — Z79899 Other long term (current) drug therapy: Secondary | ICD-10-CM

## 2018-04-30 DIAGNOSIS — I340 Nonrheumatic mitral (valve) insufficiency: Secondary | ICD-10-CM | POA: Diagnosis not present

## 2018-04-30 DIAGNOSIS — M159 Polyosteoarthritis, unspecified: Secondary | ICD-10-CM | POA: Diagnosis present

## 2018-04-30 HISTORY — DX: Other pulmonary embolism without acute cor pulmonale: I26.99

## 2018-04-30 HISTORY — DX: Dependence on supplemental oxygen: Z99.81

## 2018-04-30 LAB — CBC WITH DIFFERENTIAL/PLATELET
Abs Immature Granulocytes: 0.07 10*3/uL (ref 0.00–0.07)
Basophils Absolute: 0 10*3/uL (ref 0.0–0.1)
Basophils Relative: 1 %
Eosinophils Absolute: 0.4 10*3/uL (ref 0.0–0.5)
Eosinophils Relative: 5 %
HCT: 38.4 % (ref 36.0–46.0)
Hemoglobin: 11.4 g/dL — ABNORMAL LOW (ref 12.0–15.0)
Immature Granulocytes: 1 %
Lymphocytes Relative: 12 %
Lymphs Abs: 1 10*3/uL (ref 0.7–4.0)
MCH: 29 pg (ref 26.0–34.0)
MCHC: 29.7 g/dL — ABNORMAL LOW (ref 30.0–36.0)
MCV: 97.7 fL (ref 80.0–100.0)
Monocytes Absolute: 0.7 10*3/uL (ref 0.1–1.0)
Monocytes Relative: 8 %
Neutro Abs: 6.3 10*3/uL (ref 1.7–7.7)
Neutrophils Relative %: 73 %
Platelets: 315 10*3/uL (ref 150–400)
RBC: 3.93 MIL/uL (ref 3.87–5.11)
RDW: 14.1 % (ref 11.5–15.5)
WBC: 8.5 10*3/uL (ref 4.0–10.5)
nRBC: 0 % (ref 0.0–0.2)

## 2018-04-30 LAB — COMPREHENSIVE METABOLIC PANEL
ALT: 25 U/L (ref 0–44)
AST: 27 U/L (ref 15–41)
Albumin: 2.2 g/dL — ABNORMAL LOW (ref 3.5–5.0)
Alkaline Phosphatase: 51 U/L (ref 38–126)
Anion gap: 7 (ref 5–15)
BUN: 11 mg/dL (ref 8–23)
CO2: 25 mmol/L (ref 22–32)
Calcium: 9.3 mg/dL (ref 8.9–10.3)
Chloride: 108 mmol/L (ref 98–111)
Creatinine, Ser: 1.06 mg/dL — ABNORMAL HIGH (ref 0.44–1.00)
GFR calc Af Amer: 59 mL/min — ABNORMAL LOW (ref >60)
GFR calc non Af Amer: 51 mL/min — ABNORMAL LOW (ref >60)
Glucose, Bld: 87 mg/dL (ref 70–99)
Potassium: 3.4 mmol/L — ABNORMAL LOW (ref 3.5–5.1)
Sodium: 140 mmol/L (ref 135–145)
Total Bilirubin: 0.5 mg/dL (ref 0.3–1.2)
Total Protein: 6.4 g/dL — ABNORMAL LOW (ref 6.5–8.1)

## 2018-04-30 LAB — HEPARIN LEVEL (UNFRACTIONATED): Heparin Unfractionated: 0.2 IU/mL — ABNORMAL LOW (ref 0.30–0.70)

## 2018-04-30 LAB — LIPASE, BLOOD: Lipase: 21 U/L (ref 11–51)

## 2018-04-30 LAB — TROPONIN I: Troponin I: <0.03 ng/mL (ref <0.03)

## 2018-04-30 MED ORDER — HEPARIN BOLUS VIA INFUSION
4000.0000 [IU] | Freq: Once | INTRAVENOUS | Status: AC
  Administered 2018-04-30: 4000 [IU] via INTRAVENOUS
  Filled 2018-04-30: qty 4000

## 2018-04-30 MED ORDER — HEPARIN (PORCINE) 25000 UT/250ML-% IV SOLN
1250.0000 [IU]/h | INTRAVENOUS | Status: DC
  Administered 2018-04-30: 1100 [IU]/h via INTRAVENOUS
  Administered 2018-05-01: 1250 [IU]/h via INTRAVENOUS
  Administered 2018-05-02: 1300 [IU]/h via INTRAVENOUS
  Filled 2018-04-30 (×3): qty 250

## 2018-04-30 MED ORDER — ONDANSETRON 4 MG PO TBDP: 4.0000 mg | ORAL_TABLET | Freq: Three times a day (TID) | ORAL | Status: DC | PRN

## 2018-04-30 MED ORDER — BUDESONIDE 0.5 MG/2ML IN SUSP
1.0000 mg | Freq: Two times a day (BID) | RESPIRATORY_TRACT | Status: DC
  Administered 2018-04-30 – 2018-05-02 (×4): 1 mg via RESPIRATORY_TRACT
  Administered 2018-05-02: 0.5 mg via RESPIRATORY_TRACT
  Administered 2018-05-03 (×2): 1 mg via RESPIRATORY_TRACT
  Filled 2018-04-30 (×9): qty 4

## 2018-04-30 MED ORDER — ALBUTEROL SULFATE (2.5 MG/3ML) 0.083% IN NEBU: 2.5000 mg | INHALATION_SOLUTION | RESPIRATORY_TRACT | Status: DC | PRN

## 2018-04-30 MED ORDER — ALPRAZOLAM 0.5 MG PO TABS
1.0000 mg | ORAL_TABLET | Freq: Every evening | ORAL | Status: DC | PRN
  Administered 2018-04-30 – 2018-05-01 (×2): 1 mg via ORAL
  Filled 2018-04-30 (×2): qty 2

## 2018-04-30 MED ORDER — ASPIRIN 81 MG PO CHEW
324.0000 mg | CHEWABLE_TABLET | Freq: Once | ORAL | Status: AC
  Administered 2018-04-30: 324 mg via ORAL
  Filled 2018-04-30: qty 4

## 2018-04-30 MED ORDER — POLYETHYLENE GLYCOL 3350 17 G PO PACK
17.0000 g | PACK | Freq: Every day | ORAL | Status: DC | PRN
  Administered 2018-05-01 – 2018-05-03 (×2): 17 g via ORAL
  Filled 2018-04-30 (×2): qty 1

## 2018-04-30 MED ORDER — SODIUM CHLORIDE 0.9 % IV BOLUS
500.0000 mL | Freq: Once | INTRAVENOUS | Status: AC
  Administered 2018-04-30: 500 mL via INTRAVENOUS

## 2018-04-30 MED ORDER — TRAZODONE HCL 100 MG PO TABS
100.0000 mg | ORAL_TABLET | Freq: Every day | ORAL | Status: DC
  Administered 2018-04-30 – 2018-05-02 (×3): 100 mg via ORAL
  Filled 2018-04-30 (×4): qty 1

## 2018-04-30 MED ORDER — HYDROCODONE-ACETAMINOPHEN 5-325 MG PO TABS
1.0000 | ORAL_TABLET | ORAL | Status: DC | PRN
  Administered 2018-04-30 – 2018-05-02 (×5): 2 via ORAL
  Filled 2018-04-30 (×5): qty 2

## 2018-04-30 MED ORDER — FLUTICASONE PROPIONATE HFA 220 MCG/ACT IN AERO: 1.0000 | INHALATION_SPRAY | Freq: Two times a day (BID) | RESPIRATORY_TRACT | Status: DC

## 2018-04-30 MED ORDER — AMLODIPINE BESYLATE 5 MG PO TABS
5.0000 mg | ORAL_TABLET | Freq: Every day | ORAL | Status: DC
  Administered 2018-04-30 – 2018-05-04 (×5): 5 mg via ORAL
  Filled 2018-04-30 (×5): qty 1

## 2018-04-30 MED ORDER — PREGABALIN 75 MG PO CAPS
75.0000 mg | ORAL_CAPSULE | Freq: Two times a day (BID) | ORAL | Status: DC
  Administered 2018-04-30 – 2018-05-04 (×8): 75 mg via ORAL
  Filled 2018-04-30 (×8): qty 1

## 2018-04-30 MED ORDER — OXYCODONE-ACETAMINOPHEN 5-325 MG PO TABS: 1.0000 | ORAL_TABLET | Freq: Four times a day (QID) | ORAL | Status: DC | PRN

## 2018-04-30 MED ORDER — BOOST / RESOURCE BREEZE PO LIQD CUSTOM
1.0000 | Freq: Three times a day (TID) | ORAL | Status: DC
  Administered 2018-04-30 – 2018-05-04 (×8): 1 via ORAL

## 2018-04-30 MED ORDER — PANTOPRAZOLE SODIUM 40 MG PO TBEC
40.0000 mg | DELAYED_RELEASE_TABLET | Freq: Every day | ORAL | Status: DC
  Administered 2018-05-01 – 2018-05-04 (×4): 40 mg via ORAL
  Filled 2018-04-30 (×4): qty 1

## 2018-04-30 MED ORDER — IPRATROPIUM BROMIDE 0.02 % IN SOLN
0.5000 mg | Freq: Four times a day (QID) | RESPIRATORY_TRACT | Status: DC
  Administered 2018-04-30: 0.5 mg via RESPIRATORY_TRACT
  Filled 2018-04-30: qty 2.5

## 2018-04-30 MED ORDER — PRAVASTATIN SODIUM 40 MG PO TABS
80.0000 mg | ORAL_TABLET | Freq: Every day | ORAL | Status: DC
  Administered 2018-05-01 – 2018-05-03 (×3): 80 mg via ORAL
  Filled 2018-04-30 (×4): qty 2

## 2018-04-30 MED ORDER — ALBUTEROL SULFATE (2.5 MG/3ML) 0.083% IN NEBU
2.5000 mg | INHALATION_SOLUTION | Freq: Four times a day (QID) | RESPIRATORY_TRACT | Status: DC
  Administered 2018-04-30: 2.5 mg via RESPIRATORY_TRACT
  Filled 2018-04-30: qty 3

## 2018-04-30 MED ORDER — DOCUSATE SODIUM 100 MG PO CAPS: 200.0000 mg | ORAL_CAPSULE | Freq: Two times a day (BID) | ORAL | Status: DC | PRN

## 2018-04-30 MED ORDER — IOPAMIDOL (ISOVUE-370) INJECTION 76%
INTRAVENOUS | Status: AC
  Filled 2018-04-30: qty 100

## 2018-04-30 MED ORDER — ZOLPIDEM TARTRATE 5 MG PO TABS: 5.0000 mg | ORAL_TABLET | Freq: Every evening | ORAL | Status: DC | PRN

## 2018-04-30 MED ORDER — IOPAMIDOL (ISOVUE-370) INJECTION 76%
100.0000 mL | Freq: Once | INTRAVENOUS | Status: AC | PRN
  Administered 2018-04-30: 75 mL via INTRAVENOUS

## 2018-04-30 MED ORDER — CITALOPRAM HYDROBROMIDE 20 MG PO TABS
20.0000 mg | ORAL_TABLET | Freq: Every day | ORAL | Status: DC
  Administered 2018-04-30 – 2018-05-04 (×5): 20 mg via ORAL
  Filled 2018-04-30 (×5): qty 1

## 2018-04-30 MED ORDER — FENTANYL CITRATE (PF) 100 MCG/2ML IJ SOLN
50.0000 ug | Freq: Once | INTRAMUSCULAR | Status: AC
  Administered 2018-04-30: 50 ug via INTRAVENOUS
  Filled 2018-04-30: qty 2

## 2018-04-30 NOTE — ED Notes (Signed)
Family at bedside. 

## 2018-04-30 NOTE — Progress Notes (Signed)
ANTICOAGULATION CONSULT NOTE - Initial Consult  Pharmacy Consult for heparin  Indication: pulmonary embolus  Patient Measurements: Heparin Dosing Weight: 68 kg  Vital Signs: Temp: 98.6 F (37 C) (12/05 0848) BP: 118/63 (12/05 1230) Pulse Rate: 93 (12/05 1230)  Labs: Recent Labs    04/30/18 0920  HGB 11.4*  HCT 38.4  PLT 315  CREATININE 1.06*  TROPONINI <0.03    Estimated Creatinine Clearance: 44.7 mL/min (A) (by C-G formula based on SCr of 1.06 mg/dL (H)).   Medical History: Past Medical History:  Diagnosis Date  . Acute postoperative pain 02/03/2018  . Anemia   . B12 deficiency   . Bacteremia 10/07/2014  . Bladder incontinence   . Blind left eye   . Cancer ([place])    skin cancer   . Cataract   . Cervical spine fracture ([place])   . Chest pain 07/29/2013  . Compression fracture    C7,  upper T spine -compression fx  . COPD (chronic obstructive pulmonary disease) ([place])   . DDD (degenerative disc disease), lumbar   . Depression    major  . Diffuse myofascial pain syndrome 03/24/2015  . Dyspnea    at times- when activty  . Fever 10/05/2014  . GERD (gastroesophageal reflux disease)   . Hiatal hernia   . Hyperlipidemia   . Hypertension   . 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 Macular degeneration   . Osteoarthritis   . Osteoporosis   . Pneumonia    years ago  . Reactive airway disease   . Rupture of bowel ([place])   . Sepsis ([place]) 10/08/2014  . Stroke [facility])    "mini stroke"- balance and memory issue  . Stroke ([place])   . Wrist pain, acute 09/10/2012    Assessment: 62 female admitted with L-sided rib pain. She was just discharged from the hospital last week with pneumonia. CTA shows + for acute PE with R heart strain. hgb 11.4, plts wnl, SCr 1, eCrCl ~ 45 ml/min.   Goal of Therapy:  Heparin level 0.3-0.7 units/ml Monitor platelets by anticoagulation protocol: Yes    Plan:  -heparin bolus 4000 units x1 then 1100 units/hr -daily HL, CBC -Check  level in 8 hours    Brenda Cowher, Jake Church 04/30/2018,1:53 PM

## 2018-04-30 NOTE — H&P (Addendum)
[facility]                                                                                    Patient Demographics  [name], is a 77 y.o. female  CSN: [id number]  MRN: [id number]  DOB - July 03, 1940  Admit Date - 04/30/2018  Outpatient Primary MD for the patient is Alvester Chou, NP   With History of -  Past Medical History:  Diagnosis Date  . Acute postoperative pain 02/03/2018  . Anemia   . B12 deficiency   . Bacteremia 10/07/2014  . Bladder incontinence   . Blind left eye   . Cancer ([place])    skin cancer   . Cataract   . Cervical spine fracture ([place])   . Chest pain 07/29/2013  . Compression fracture    C7,  upper T spine -compression fx  . COPD (chronic obstructive pulmonary disease) ([place])   . DDD (degenerative disc disease), lumbar   . Depression    major  . Diffuse myofascial pain syndrome 03/24/2015  . Dyspnea    at times- when activty  . Fever 10/05/2014  . GERD (gastroesophageal reflux disease)   . Hiatal hernia   . Hyperlipidemia   . Hypertension   . 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 Macular degeneration   . Osteoarthritis   . Osteoporosis   . Pneumonia    years ago  . Reactive airway disease   . Rupture of bowel ([place])   . Sepsis ([place]) 10/08/2014  . Stroke [facility])    "mini stroke"- balance and memory issue  . Stroke ([place])   . Wrist pain, acute 09/10/2012      Past Surgical History:  Procedure Laterality Date  . ABDOMINAL HYSTERECTOMY    . ABDOMINAL SURGERY    . APPENDECTOMY    . BLADDER SURGERY    . CHOLECYSTECTOMY    . COLON SURGERY    . COLOSTOMY REVERSAL    . EYE SURGERY     cataracts  . HIP SURGERY Right 08/2016  . INTRAMEDULLARY (IM) NAIL INTERTROCHANTERIC Right 09/13/2016   Procedure: INTRAMEDULLARY (IM) NAIL INTERTROCHANTRIC RIGHT;  Surgeon: Leandrew Koyanagi, MD;  Location: [place];  Service: Orthopedics;  Laterality: Right;  . JOINT REPLACEMENT Bilateral   . KNEE SURGERY    . OSTOMY    . RECTOCELE REPAIR     . TOE SURGERY Right    3rd  . TOTAL HIP ARTHROPLASTY Right 09/30/2017   Procedure: RIGHT HIP IM NAIL REMOVAL WITH CONVERSION TO TOTAL HIP ARTHOPLASTY, anterior approach;  Surgeon: Renette Butters, MD;  Location: [place];  Service: Orthopedics;  Laterality: Right;    in for   Chief Complaint  Patient presents with  . Chest Pain     HPI  [name]  is a 77 y.o. female, 3 significant for COPD/pneumonia, hyperlipidemia and hypertension who was recently discharged from our facility on 1130 after respiratory failure, pneumonia, UTI.  Patient was treated with IV antibiotics, nebulizer treatment and was discharged home on 3 L of nasal cannula.  The patient is presenting today for 1 day history of acute chest pain with shortness of breath.  The chest pain was on the left, worse with deep breath.  She took a baby aspirin prior to arrival. In the emergency room CT of the chest showed bilateral, multiple small PEs that involve the right lower lobe right upper lobe and lingula with extensive airspace disease involving both lower lobes.  Patient denies any fever or chills.    Review of Systems    In addition to the HPI above,  No Fever-chills, No Headache, No changes with Vision or hearing, No problems swallowing food or Liquids, No Abdominal pain, No Nausea or Vommitting, Bowel movements are regular, No Blood in stool or Urine, No dysuria, No new skin rashes or bruises, No new joints pains-aches,  No new weakness, tingling, numbness in any extremity, No recent weight gain or loss, No polyuria, polydypsia or polyphagia, No significant Mental Stressors.  A full 10 point Review of Systems was done, except as stated above, all other Review of Systems were negative.   Social History Social History   Tobacco Use  . Smoking status: Former Smoker    Packs/day: 1.50    Years: 30.00    Pack years: 45.00    Types: E-cigarettes, Cigarettes    Last attempt to quit: 2013    Years since  quitting: 6.9  . Smokeless tobacco: Never Used  Substance Use Topics  . Alcohol use: No    Alcohol/week: 0.0 standard drinks     Family History Family History  Problem Relation Age of Onset  . Heart failure Mother   . Heart attack Father      Prior to Admission medications   Medication Sig Start Date End Date Taking? Authorizing Provider  albuterol (ACCUNEB) 1.25 MG/3ML nebulizer solution Take 1 ampule by nebulization every 6 (six) hours as needed for wheezing.   Yes [provider]  albuterol (PROVENTIL HFA;VENTOLIN HFA) 108 (90 BASE) MCG/ACT inhaler Inhale 2 puffs into the lungs every 6 (six) hours as needed for wheezing or shortness of breath.    Yes [provider]  alendronate (FOSAMAX) 70 MG tablet Take 70 mg by mouth every Monday. Take with a full glass of water on an empty stomach.   Yes [provider]  ALPRAZolam Duanne Moron) 1 MG tablet Take 1 mg by mouth at bedtime as needed for sleep.   Yes [provider]  amLODipine (NORVASC) 5 MG tablet Take 5 mg by mouth daily.  04/06/13  Yes [provider]  Calcium 500-100 MG-UNIT CHEW Chew 1 tablet by mouth daily.   Yes [provider]  Calcium-Phosphorus-Vitamin D (CALCIUM/D3 ADULT GUMMIES PO) Take 1 tablet by mouth daily.   Yes [provider]  Cholecalciferol (VITAMIN D HIGH POTENCY PO) Take 5,000 Units by mouth daily.   Yes [provider]  citalopram (CELEXA) 20 MG tablet Take 20 mg by mouth daily.  02/27/17  Yes [provider]  docusate sodium (COLACE) 100 MG capsule Take 200 mg by mouth 2 (two) times daily as needed for moderate constipation.    Yes [provider]  esomeprazole (NEXIUM) 40 MG capsule Take 40 mg by mouth daily.    Yes [provider]  Ferrous Sulfate (IRON) 325 (65 Fe) MG TABS Take 1 tablet (325 mg total) by mouth 2 (two) times daily. Patient taking differently: Take 1 tablet by mouth 2 (two) times a week.  10/01/17   Yes Martensen, Charna Elizabeth III, PA-C  fluticasone (FLOVENT HFA) 220 MCG/ACT inhaler Inhale 1 puff into the lungs 2 (two) times  daily.   Yes [provider]  guaiFENesin (MUCINEX) 600 MG 12 hr tablet Take 600 mg by mouth 2 (two) times daily as needed for cough.   Yes [provider]  Javier Docker Oil 500 MG CAPS Take 1 capsule by mouth daily.   Yes [provider]  linaclotide (LINZESS) 290 MCG CAPS capsule Take 290 mcg by mouth daily.   Yes [provider]  lovastatin (MEVACOR) 10 MG tablet Take 10 mg by mouth daily. 09/22/14  Yes [provider]  meloxicam (MOBIC) 15 MG tablet Take 15 mg by mouth daily.  05/23/16  Yes [provider]  Menthol, Topical Analgesic, (BIOFREEZE EX) Apply 1 application topically daily as needed (muscle pain).   Yes [provider]  ondansetron (ZOFRAN ODT) 4 MG disintegrating tablet Take 1 tablet (4 mg total) by mouth every 8 (eight) hours as needed for nausea or vomiting. 02/26/18  Yes Eula Listen, MD  polyethylene glycol (MIRALAX / GLYCOLAX) packet Take 17 g by mouth daily as needed (for constipation.).    Yes [provider]  Potassium 95 MG TABS Take 1 tablet by mouth daily.   Yes [provider]  pregabalin (LYRICA) 75 MG capsule Take 1 capsule (75 mg total) by mouth 2 (two) times daily. 04/06/18 10/03/18 Yes Milinda Pointer, MD  traZODone (DESYREL) 100 MG tablet Take 100 mg by mouth at bedtime.    Yes [provider]  vitamin B-12 (CYANOCOBALAMIN) 500 MCG tablet Take 500 mcg by mouth daily.   Yes [provider]  levofloxacin (LEVAQUIN) 750 MG tablet Take 1 tablet (750 mg total) by mouth every other day. 04/24/18   Caren Griffins, MD    Allergies  Allergen Reactions  . Ampicillin Anaphylaxis, Nausea Only and Swelling    SEVERE HEADACHE MUSCLE CRAMPS ANGIOEDEMA THRUSH PATIENT HAS HAD A PCN REACTION WITH IMMEDIATE RASH, FACIAL/TONGUE/THROAT SWELLING, SOB,  OR LIGHTHEADEDNESS WITH HYPOTENSION:  #  #  YES  #  #  Has patient had a PCN reaction causing severe rash involving mucus membranes or skin necrosis: No Has patient had a PCN reaction that required hospitalization:No Has patient had a PCN reaction occurring within the last 10 years: No.  . Ampicillin-Sulbactam Sodium Anaphylaxis and Other (See Comments)    SEVERE HEADACHE MUSCLE CRAMPS ANGIOEDEMA THRUSH PATIENT HAS HAD A PCN REACTION WITH IMMEDIATE RASH, FACIAL/TONGUE/THROAT SWELLING, SOB, OR LIGHTHEADEDNESS WITH HYPOTENSION: # # YES # # Has patient had a PCN reaction causing severe rash involving mucus membranes or skin necrosis: No Has patient had a PCN reaction that required hospitalization:No Has patient had a PCN reaction occurring within the last 10 years: No   . Ambien [Zolpidem Tartrate] Nausea And Vomiting and Other (See Comments)    HALLUCINATIONS SWEATING  . Flexeril [Cyclobenzaprine] Other (See Comments)    EXTRAPYRAMIDAL MOVEMENT INVOLUNTARY MUSCLE JERKING  . Tape Itching, Rash and Other (See Comments)    Paper tape only. Adhesive tape=itching/burning/rash  . Toradol [Ketorolac Tromethamine] Nausea Only and Other (See Comments)    HEADACHE  BACKACHE  . Sulfamethoxazole-Trimethoprim     UNSPECIFIED REACTION   . Zolpidem     Hallucinations  . Cephalexin Rash and Other (See Comments)    HEADACHES  . Tramadol Rash and Other (See Comments)    HEADACHE     Physical Exam  Vitals  Blood pressure (!) 92/51, pulse 82, temperature 98.6 F (37 C), resp. rate (!) 24, SpO2 97 %.   1. General well-developed, well-nourished female, very  pleasant  2. Normal affect and insight, Not Suicidal or Homicidal, Awake Alert, Oriented X 3.  3. No F.N deficits, grossly, patient moving all extremities.  4. Ears and Eyes appear Normal, Conjunctivae clear, PERRLA. Moist Oral Mucosa.  5. Supple Neck, No JVD, No cervical lymphadenopathy appriciated, No Carotid Bruits.  6. Symmetrical  Chest wall movement, Good air movement bilaterally, decreased breath sounds bilateral basally.  7. RRR, No Gallops, Rubs or Murmurs, No Parasternal Heave.  8. Positive Bowel Sounds, Abdomen Soft, Non tender, No organomegaly appriciated,No rebound -guarding or rigidity.  9.  No Cyanosis, Normal Skin Turgor, No Skin Rash or Bruise.  10. Good muscle tone,  joints appear normal , no effusions, Normal ROM.    Data Review  CBC Recent Labs  Lab 04/30/18 0920  WBC 8.5  HGB 11.4*  HCT 38.4  PLT 315  MCV 97.7  MCH 29.0  MCHC 29.7*  RDW 14.1  LYMPHSABS 1.0  MONOABS 0.7  EOSABS 0.4  BASOSABS 0.0   ------------------------------------------------------------------------------------------------------------------  Chemistries  Recent Labs  Lab 04/24/18 0722 04/30/18 0920  NA 139 140  K 3.8 3.4*  CL 107 108  CO2 24 25  GLUCOSE 87 87  BUN 23 11  CREATININE 1.17* 1.06*  CALCIUM 9.0 9.3  AST  --  27  ALT  --  25  ALKPHOS  --  51  BILITOT  --  0.5   ------------------------------------------------------------------------------------------------------------------ estimated creatinine clearance is 44.7 mL/min (A) (by C-G formula based on SCr of 1.06 mg/dL (H)). ------------------------------------------------------------------------------------------------------------------ No results for input(s): TSH, T4TOTAL, T3FREE, THYROIDAB in the last 72 hours.  Invalid input(s): FREET3   Coagulation profile No results for input(s): INR, PROTIME in the last 168 hours. ------------------------------------------------------------------------------------------------------------------- No results for input(s): DDIMER in the last 72 hours. -------------------------------------------------------------------------------------------------------------------  Cardiac Enzymes Recent Labs  Lab 04/30/18 0920  TROPONINI <0.03    ------------------------------------------------------------------------------------------------------------------ Invalid input(s): POCBNP   ---------------------------------------------------------------------------------------------------------------  Urinalysis    Component Value Date/Time   COLORURINE YELLOW 04/20/2018 1416   APPEARANCEUR CLOUDY (A) 04/20/2018 1416   LABSPEC 1.009 04/20/2018 1416   PHURINE 6.0 04/20/2018 1416   GLUCOSEU NEGATIVE 04/20/2018 1416   HGBUR NEGATIVE 04/20/2018 1416   BILIRUBINUR NEGATIVE 04/20/2018 1416   KETONESUR NEGATIVE 04/20/2018 1416   PROTEINUR NEGATIVE 04/20/2018 1416   UROBILINOGEN 0.2 02/20/2015 0605   NITRITE NEGATIVE 04/20/2018 1416   LEUKOCYTESUR LARGE (A) 04/20/2018 1416    ----------------------------------------------------------------------------------------------------------------  Imaging results:   Dg Chest 2 View  Result Date: 04/30/2018 CLINICAL DATA:  Left-sided chest pain and shortness of breath. EXAM: CHEST - 2 VIEW COMPARISON:  04/22/2018 FINDINGS: Cardiomediastinal silhouette is normal. Mediastinal contours appear intact. Tortuosity and calcific atherosclerotic disease of the aorta. There is no evidence of pleural effusion or pneumothorax. Bilateral patchy airspace consolidation with peripheral predominance with coarsening of the interstitial markings. Osseous structures are without acute abnormality. Soft tissues are grossly normal. IMPRESSION: Bilateral patchy airspace consolidation with peripheral predominance with coarsening of the interstitial markings. The aeration of the lungs has worsened when compared to the prior radiographs. Electronically Signed   By: Fidela Salisbury M.D.   On: 04/30/2018 09:55   Dg Chest 2 View  Result Date: 04/20/2018 CLINICAL DATA:  Chest pain, shortness of Breath EXAM: CHEST - 2 VIEW COMPARISON:  11/19/2016 FINDINGS: Cardiomegaly. Patchy bilateral airspace opacities, right greater  than left. This is most confluent in the right lung base. Cannot exclude pneumonia. No effusions or acute bony abnormality. IMPRESSION: Cardiomegaly. Patchy bilateral opacities, most pronounced in the  right lower lobe concerning for pneumonia. Electronically Signed   By: Rolm Baptise M.D.   On: 04/20/2018 11:28   Ct Angio Chest Pe W/cm &/or Wo Cm  Result Date: 04/30/2018 CLINICAL DATA:  Pulmonary embolus suspected. High probability. Left-sided rib pain. To kidney a and tachycardia. EXAM: CT ANGIOGRAPHY CHEST WITH CONTRAST TECHNIQUE: Multidetector CT imaging of the chest was performed using the standard protocol during bolus administration of intravenous contrast. Multiplanar CT image reconstructions and MIPs were obtained to evaluate the vascular anatomy. CONTRAST:  47mL ISOVUE-370 IOPAMIDOL (ISOVUE-370) INJECTION 76% COMPARISON:  None. FINDINGS: Cardiovascular: Heart size is normal. No pericardial effusion. Aortic atherosclerosis. Calcification in the left main coronary artery and LAD noted. The main pulmonary artery measures 3.6 cm in transverse diameter. There is a filling defect identified within the right lower lobe lobar pulmonary artery, image 209/7. There is a filling defect within the right upper lobe lobar pulmonary artery, image 90/10. Complete occlusion scratch set segmental pulmonary artery filling defect to a the left upper lobe is identified, image 53/5. There is complete occlusion of the segmental pulmonary artery to the lingula. The RV to LV ratio is equal to 5.1/4.4 equals 1.15. Mediastinum/Nodes: Normal appearance of the thyroid gland. The trachea appears patent and is midline. Normal appearance of the esophagus. No enlarged axillary or supraclavicular lymph nodes. Prominent mediastinal lymph nodes identified without adenopathy. Lungs/Pleura: Extensive bilateral patchy areas of airspace consolidation and ground-glass attenuation throughout both lungs. No pleural effusion identified. Calcified  granuloma within the left apex. Upper Abdomen: No acute abnormality identified. Musculoskeletal: No chest wall abnormality. No acute or significant osseous findings. Unchanged T2 compression deformity. Review of the MIP images confirms the above findings. IMPRESSION: 1. Positive for acute PE with CT evidence of right heart strain (RV/LV Ratio = 1.15) consistent with at least submassive (intermediate risk) PE. The presence of right heart strain has been associated with an increased risk of morbidity and mortality. Please activate Code PE by paging [phone]. 2. Extensive bilateral patchy areas of ground-glass attenuation and airspace consolidation which may represent areas of alveolar edema, hemorrhage and/or infection. 3.  Aortic Atherosclerosis (ICD10-I70.0). 4. Critical Value/emergent results were called by telephone at the time of interpretation on 04/30/2018 at 1:21 pm to Dr. Sherwood Gambler , who verbally acknowledged these results. Electronically Signed   By: Kerby Moors M.D.   On: 04/30/2018 13:21   Dg Chest Port 1 View  Result Date: 04/22/2018 CLINICAL DATA:  Cough and shortness of breath with hypoxia EXAM: PORTABLE CHEST 1 VIEW COMPARISON:  April 20, 2018 FINDINGS: Patchy infiltrate is seen bilaterally, most notably in the right lower lung zone medially. Heart is upper normal in size with pulmonary vascularity normal. No adenopathy. No bone lesions. IMPRESSION: Patchy airspace opacity, likely multifocal pneumonia. There may be a degree of underlying fibrosis as well. Stable cardiac silhouette. No new opacity. Electronically Signed   By: Lowella Grip III M.D.   On: 04/22/2018 12:29   Dg C-arm 1-60 Min-no Report  Result Date: 04/07/2018 Fluoroscopy was utilized by the requesting physician.  No radiographic interpretation.   Ct Renal Stone Study  Result Date: 04/30/2018 CLINICAL DATA:  Left flank pain. EXAM: CT ABDOMEN AND PELVIS WITHOUT CONTRAST TECHNIQUE: Multidetector CT imaging of  the abdomen and pelvis was performed following the standard protocol without IV contrast. COMPARISON:  None. FINDINGS: Lower chest: Patchy areas of ground-glass airspace consolidation with associated interstitial thickening in bilateral lower lobes with peripheral predominance. No pleural effusion or pneumothorax. Enlarged  heart. Calcific atherosclerotic disease of the coronary arteries and aorta. Hepatobiliary: No focal liver abnormality is seen. Status post cholecystectomy. No biliary dilatation. Pancreas: Unremarkable. No pancreatic ductal dilatation or surrounding inflammatory changes. Spleen: Normal in size without focal abnormality. Adrenals/Urinary Tract: Normal adrenal glands. Left-sided circumscribed hypoattenuated renal nodules measuring water density likely represent cysts. No evidence of hydronephrosis or nephrolithiasis. Vascular calcifications noted. Stomach/Bowel: Stomach is within normal limits. Post appendectomy. No evidence of bowel wall thickening, distention, or inflammatory changes. Diffuse colonic diverticulosis without evidence of acute diverticulitis. Vascular/Lymphatic: Aortic atherosclerosis. No enlarged abdominal or pelvic lymph nodes. Reproductive: Status post hysterectomy. No adnexal masses. Other: No abdominal wall hernia or abnormality. No abdominopelvic ascites. Musculoskeletal: Spondylosis of the lumbosacral spine. IMPRESSION: No evidence of nephrolithiasis or hydronephrosis. Diffuse colonic diverticulosis without evidence of acute diverticulitis. Patchy areas of ground-glass attenuation of the lower lobes of the lungs with associated interstitial thickening. Findings may represent atypical pneumonia. Electronically Signed   By: Fidela Salisbury M.D.   On: 04/30/2018 10:27   Dg Hip Unilat  With Pelvis 2-3 Views Right  Result Date: 04/17/2018 CLINICAL DATA:  Worsening right hip pain following right hip surgery in May 2019. EXAM: DG HIP (WITH OR WITHOUT PELVIS) 2-3V RIGHT  COMPARISON:  03/29/2018. FINDINGS: Stable right total hip prosthesis. No fracture or dislocation seen. Diffuse osteopenia. IMPRESSION: 1. No acute abnormality. 2. Hardware intact. Electronically Signed   By: Claudie Revering M.D.   On: 04/17/2018 11:59    My personal review of EKG: Normal sinus rhythm at 98 bpm with nonspecific T wave changes  Assessment & Plan  Pulmonary embolism with right ventricular strain Check lower extremity Dopplers Heparin drip Check echocardiogram  History of respiratory failure and COPD Continue with duo nebs Recently discharged from our facility on 11/30 Last dose of antibiotics today  Hypertension Continue with amlodipine  Anxiety Continue with Xanax/Celexa  Hyperlipidemia Continue with statin  GERD Continue with PPI  DVT Prophylaxis continue with heparin treatment dose  AM Labs Ordered, also please review Full Orders    Code Status full  Disposition Plan: To be determined  Time spent in minutes : 42 minutes  Condition GUARDED   @SIGNATURE @

## 2018-04-30 NOTE — ED Triage Notes (Signed)
Pt arrives via [facility] for c/o L sided rib pain, ems reports pt is tachypneic and tachycardic, just finished abx for pna that she was diagnosed with last week. Pt wears 3L O2 at baseline. A/ox4.

## 2018-04-30 NOTE — ED Provider Notes (Signed)
[facility] EMERGENCY DEPARTMENT Provider Note   CSN: [id number] Arrival date & time: 04/30/18  0840     History   Chief Complaint Chief Complaint  Patient presents with  . Chest Pain    HPI [name] is a 77 y.o. female.  HPI  77 year old female presents with left-sided chest pain.  Is in her mid axillary line.  Started around 7 AM and woke her up from sleep.  It feels like someone is punching her in her chest.  She states that deep breaths make it worse.  She recently got over pneumonia and states she is feeling better and has 1 more dose of antibiotics to take.  She denies cough, fever, shortness of breath.  No leg swelling.  She took a baby aspirin prior to arrival.  The pain is better than when it first started but still present and she rates it as an 8 out of 10.  Past Medical History:  Diagnosis Date  . Acute postoperative pain 02/03/2018  . Anemia   . B12 deficiency   . Bacteremia 10/07/2014  . Bladder incontinence   . Blind left eye   . Cancer ([place])    skin cancer   . Cataract   . Cervical spine fracture ([place])   . Chest pain 07/29/2013  . Compression fracture    C7,  upper T spine -compression fx  . COPD (chronic obstructive pulmonary disease) ([place])   . DDD (degenerative disc disease), lumbar   . Depression    major  . Diffuse myofascial pain syndrome 03/24/2015  . Dyspnea    at times- when activty  . Fever 10/05/2014  . GERD (gastroesophageal reflux disease)   . Hiatal hernia   . Hyperlipidemia   . Hypertension   . 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 Macular degeneration   . Osteoarthritis   . Osteoporosis   . Pneumonia    years ago  . Reactive airway disease   . Rupture of bowel ([place])   . Sepsis ([place]) 10/08/2014  . Stroke [facility])    "mini stroke"- balance and memory issue  . Stroke ([place])   . Wrist pain, acute 09/10/2012    Patient Active Problem List   Diagnosis Date Noted  . PE (pulmonary thromboembolism)  ([place]) 04/30/2018  . Acute metabolic encephalopathy   . Respiratory failure with hypoxia ([place]) 04/20/2018  . CAP (community acquired pneumonia) 04/20/2018  . Acute lower UTI 04/20/2018  . Chronic lower extremity pain (Right) 04/07/2018  . Abnormal MRI, lumbar spine 04/07/2018  . Lumbar facet hypertrophy (Multilevel) (Bilateral) 04/07/2018  . Lumbar foraminal stenosis (L3-4, L4-5) (Bilateral) 04/07/2018  . Annular tear of lumbar disc (L3-4, L4-5) 04/07/2018  . Acute right hip pain 02/26/2018  . Closed hip fracture, sequela (Right) 02/03/2018  . It band syndrome, right 12/18/2017  . History of hip replacement (Right) 12/18/2017  . Ileus ([place]) 10/27/2017  . Primary osteoarthritis of hip 09/30/2017  . History of small bowel obstruction 09/10/2017  . 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 SBO (small bowel obstruction) ([place]) 08/24/2017  . Abnormal x-ray of pelvis 08/13/2017  . Other specified dorsopathies, sacral and sacrococcygeal region 08/04/2017  . Spondylosis without myelopathy or radiculopathy, lumbosacral region 07/17/2017  . Non-traumatic compression fracture of T3 thoracic vertebra, sequela 07/02/2017  . High risk medication use 07/02/2017  . At high risk for falls 07/02/2017  . Chronic sacroiliac joint pain (Right) 07/02/2017  . CKD (chronic kidney disease)  stage 3, GFR 30-59 ml/min (HCC) 04/23/2017  . Chronic knee pain s/p total knee replacement (TKR) (Right) 04/02/2017  . DDD (degenerative disc disease), lumbar 03/05/2017  . Chronic knee pain (Bilateral) (R>L) 03/05/2017  . Osteoarthritis 03/05/2017  . Chronic musculoskeletal pain 03/05/2017  . Neurogenic pain 03/05/2017  . Chronic myofascial pain 02/12/2017  . Lumbar facet syndrome (Bilateral) (L>R) 02/12/2017  . Long term prescription benzodiazepine use 02/12/2017  . Opiate use 02/12/2017  . Disorder of skeletal system 02/12/2017  . Pharmacologic therapy 02/12/2017  . Problems influencing health  status 02/12/2017  . 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 NSAID long-term use 02/12/2017  . DDD (degenerative disc disease), cervical 11/11/2016  . Lumbar facet arthropathy (Bilateral) 11/11/2016  . 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 CAFL (chronic airflow limitation) ([place]) 11/07/2016  . Depression, major, in partial remission ([place]) 11/07/2016  . 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 Bed wetting 11/07/2016  . Long term current use of opiate analgesic 11/07/2016  . Long term prescription opiate use 11/07/2016  . Chronic pain syndrome 11/07/2016  . Chronic neck pain (Secondary Area of Pain) (Bilateral) (L>R) 11/07/2016  . Chronic hip pain (Right) 11/07/2016  . Rib pain 11/07/2016  . Age-related osteoporosis with current pathological fracture with routine healing 09/20/2016  . History of stroke 09/20/2016  . Major depression in remission ([place]) 09/20/2016  . Intertrochanteric fracture of femur, sequela (Right) 09/13/2016  . Fall   . Chronic shoulder pain (Bilateral) 08/12/2016  . Hx of total knee replacement (Bilateral) 04/04/2016  . Chronic shoulder pain (Left) 02/28/2016  . Hoarseness 01/12/2016  . Pharyngoesophageal dysphagia 01/12/2016  . Chronic low back pain [facility]) (Bilateral) (L>R) 10/26/2015  . 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 UTI (urinary tract infection) 06/25/2015  . Syncope, non cardiac 05/22/2015  . Leukopenia 10/07/2014  . Thrombocytopenia ([place]) 10/07/2014  . Hypoxia 10/05/2014  . Anxiety 06/15/2014  . Apoplectic vertigo 06/15/2014  . Duodenogastric reflux 06/15/2014  . Benign essential HTN 06/15/2014  . Infection of the upper respiratory tract 06/15/2014  . Hypercholesteremia 07/30/2013  . HTN (hypertension) 07/30/2013  . GERD (gastroesophageal reflux disease) 07/30/2013  . Dizziness and giddiness  06/18/2013  . Memory loss 05/03/2013  . Chronic knee pain (Primary Area of Pain) (Right) 09/10/2012    Past Surgical History:  Procedure Laterality Date  . ABDOMINAL HYSTERECTOMY    . ABDOMINAL SURGERY    . APPENDECTOMY    . BLADDER SURGERY    . CHOLECYSTECTOMY    . COLON SURGERY    . COLOSTOMY REVERSAL    . EYE SURGERY     cataracts  . HIP SURGERY Right 08/2016  . INTRAMEDULLARY (IM) NAIL INTERTROCHANTERIC Right 09/13/2016   Procedure: INTRAMEDULLARY (IM) NAIL INTERTROCHANTRIC RIGHT;  Surgeon: Leandrew Koyanagi, MD;  Location: [facility];  Service: Orthopedics;  Laterality: Right;  . JOINT REPLACEMENT Bilateral   . KNEE SURGERY    . OSTOMY    . RECTOCELE REPAIR    . TOE SURGERY Right    3rd  . TOTAL HIP ARTHROPLASTY Right 09/30/2017   Procedure: RIGHT HIP IM NAIL REMOVAL WITH CONVERSION TO TOTAL HIP ARTHOPLASTY, anterior approach;  Surgeon: Renette Butters, MD;  Location: [facility];  Service: Orthopedics;  Laterality: Right;     OB History    Gravida  0   Para  0   Term  0  Preterm  0   AB  0   Living        SAB  0   TAB  0   Ectopic  0   Multiple      Live Births               Home Medications    Prior to Admission medications   Medication Sig Start Date End Date Taking? Authorizing Provider  albuterol (ACCUNEB) 1.25 MG/3ML nebulizer solution Take 1 ampule by nebulization every 6 (six) hours as needed for wheezing.   Yes [provider]  albuterol (PROVENTIL HFA;VENTOLIN HFA) 108 (90 BASE) MCG/ACT inhaler Inhale 2 puffs into the lungs every 6 (six) hours as needed for wheezing or shortness of breath.    Yes [provider]  alendronate (FOSAMAX) 70 MG tablet Take 70 mg by mouth every Monday. Take with a full glass of water on an empty stomach.   Yes [provider]  ALPRAZolam Duanne Moron) 1 MG tablet Take 1 mg by mouth at bedtime as needed for sleep.   Yes [provider]  amLODipine (NORVASC) 5 MG tablet Take 5 mg by mouth daily.   04/06/13  Yes [provider]  Calcium 500-100 MG-UNIT CHEW Chew 1 tablet by mouth daily.   Yes [provider]  Calcium-Phosphorus-Vitamin D (CALCIUM/D3 ADULT GUMMIES PO) Take 1 tablet by mouth daily.   Yes [provider]  Cholecalciferol (VITAMIN D HIGH POTENCY PO) Take 5,000 Units by mouth daily.   Yes [provider]  citalopram (CELEXA) 20 MG tablet Take 20 mg by mouth daily.  02/27/17  Yes [provider]  docusate sodium (COLACE) 100 MG capsule Take 200 mg by mouth 2 (two) times daily as needed for moderate constipation.    Yes [provider]  esomeprazole (NEXIUM) 40 MG capsule Take 40 mg by mouth daily.    Yes [provider]  Ferrous Sulfate (IRON) 325 (65 Fe) MG TABS Take 1 tablet (325 mg total) by mouth 2 (two) times daily. Patient taking differently: Take 1 tablet by mouth 2 (two) times a week.  10/01/17  Yes Martensen, Charna Elizabeth III, PA-C  fluticasone (FLOVENT HFA) 220 MCG/ACT inhaler Inhale 1 puff into the lungs 2 (two) times daily.   Yes [provider]  guaiFENesin (MUCINEX) 600 MG 12 hr tablet Take 600 mg by mouth 2 (two) times daily as needed for cough.   Yes [provider]  Javier Docker Oil 500 MG CAPS Take 1 capsule by mouth daily.   Yes [provider]  linaclotide (LINZESS) 290 MCG CAPS capsule Take 290 mcg by mouth daily.   Yes [provider]  lovastatin (MEVACOR) 10 MG tablet Take 10 mg by mouth daily. 09/22/14  Yes [provider]  meloxicam (MOBIC) 15 MG tablet Take 15 mg by mouth daily.  05/23/16  Yes [provider]  Menthol, Topical Analgesic, (BIOFREEZE EX) Apply 1 application topically daily as needed (muscle pain).   Yes [provider]  ondansetron (ZOFRAN ODT) 4 MG disintegrating tablet Take 1 tablet (4 mg total) by mouth every 8 (eight) hours as needed for nausea or vomiting. 02/26/18  Yes Eula Listen, MD  polyethylene glycol (MIRALAX  / GLYCOLAX) packet Take 17 g by mouth daily as needed (for constipation.).    Yes [provider]  Potassium 95 MG TABS Take 1 tablet by mouth daily.   Yes [provider]  pregabalin (LYRICA) 75 MG capsule Take 1  capsule (75 mg total) by mouth 2 (two) times daily. 04/06/18 10/03/18 Yes Milinda Pointer, MD  traZODone (DESYREL) 100 MG tablet Take 100 mg by mouth at bedtime.    Yes [provider]  vitamin B-12 (CYANOCOBALAMIN) 500 MCG tablet Take 500 mcg by mouth daily.   Yes [provider]  levofloxacin (LEVAQUIN) 750 MG tablet Take 1 tablet (750 mg total) by mouth every other day. 04/24/18   Caren Griffins, MD    Family History Family History  Problem Relation Age of Onset  . Heart failure Mother   . Heart attack Father     Social History Social History   Tobacco Use  . Smoking status: Former Smoker    Packs/day: 1.50    Years: 30.00    Pack years: 45.00    Types: E-cigarettes, Cigarettes    Last attempt to quit: 2013    Years since quitting: 6.9  . Smokeless tobacco: Never Used  Substance Use Topics  . Alcohol use: No    Alcohol/week: 0.0 standard drinks  . Drug use: No     Allergies   Ampicillin; Ampicillin-sulbactam sodium; Ambien [zolpidem tartrate]; Flexeril [cyclobenzaprine]; Tape; Toradol [ketorolac tromethamine]; Sulfamethoxazole-trimethoprim; Zolpidem; Cephalexin; and Tramadol   Review of Systems Review of Systems  Constitutional: Negative for fever.  Respiratory: Negative for cough and shortness of breath.   Cardiovascular: Positive for chest pain.  Gastrointestinal: Negative for abdominal pain.  All other systems reviewed and are negative.    Physical Exam Updated Vital Signs BP (!) 92/51   Pulse 82   Temp 98.6 F (37 C)   Resp (!) 24   SpO2 97%   Physical Exam  Constitutional: She appears well-developed and well-nourished.  Non-toxic appearance. She does not appear ill. No distress.  HENT:  Head:  Normocephalic and atraumatic.  Right Ear: External ear normal.  Left Ear: External ear normal.  Nose: Nose normal.  Eyes: Right eye exhibits no discharge. Left eye exhibits no discharge.  Cardiovascular: Normal rate, regular rhythm and normal heart sounds.  Pulmonary/Chest: Effort normal. She has rales in the right lower field.  No significant chest wall/rib tenderness  Abdominal: Soft. There is no tenderness. There is CVA tenderness (left).  Neurological: She is alert.  Skin: Skin is warm and dry.  No rash, especially no obvious zoster over area of pain  Psychiatric: Her mood appears not anxious.  Nursing note and vitals reviewed.    ED Treatments / Results  Labs (all labs ordered are listed, but only abnormal results are displayed) Labs Reviewed  COMPREHENSIVE METABOLIC PANEL - Abnormal; Notable for the following components:      Result Value   Potassium 3.4 (*)    Creatinine, Ser 1.06 (*)    Total Protein 6.4 (*)    Albumin 2.2 (*)    GFR calc non Af Amer 51 (*)    GFR calc Af Amer 59 (*)    All other components within normal limits  CBC WITH DIFFERENTIAL/PLATELET - Abnormal; Notable for the following components:   Hemoglobin 11.4 (*)    MCHC 29.7 (*)    All other components within normal limits  LIPASE, BLOOD  TROPONIN I  URINALYSIS, ROUTINE W REFLEX MICROSCOPIC  HEPARIN LEVEL (UNFRACTIONATED)    EKG EKG Interpretation  Date/Time:  Thursday April 30 2018 09:15:05 EST Ventricular Rate:  98 PR Interval:    QRS Duration: 78 QT Interval:  348 QTC Calculation: 445 R Axis:   37 Text Interpretation:  Sinus rhythm  Low voltage, precordial leads Borderline T wave abnormalities rate faster compared to Nov 2019 Confirmed by Sherwood Gambler [id number]) on 04/30/2018 3:15:26 PM   Radiology Dg Chest 2 View  Result Date: 04/30/2018 CLINICAL DATA:  Left-sided chest pain and shortness of breath. EXAM: CHEST - 2 VIEW COMPARISON:  04/22/2018 FINDINGS: Cardiomediastinal  silhouette is normal. Mediastinal contours appear intact. Tortuosity and calcific atherosclerotic disease of the aorta. There is no evidence of pleural effusion or pneumothorax. Bilateral patchy airspace consolidation with peripheral predominance with coarsening of the interstitial markings. Osseous structures are without acute abnormality. Soft tissues are grossly normal. IMPRESSION: Bilateral patchy airspace consolidation with peripheral predominance with coarsening of the interstitial markings. The aeration of the lungs has worsened when compared to the prior radiographs. Electronically Signed   By: Fidela Salisbury M.D.   On: 04/30/2018 09:55   Ct Angio Chest Pe W/cm &/or Wo Cm  Result Date: 04/30/2018 CLINICAL DATA:  Pulmonary embolus suspected. High probability. Left-sided rib pain. To kidney a and tachycardia. EXAM: CT ANGIOGRAPHY CHEST WITH CONTRAST TECHNIQUE: Multidetector CT imaging of the chest was performed using the standard protocol during bolus administration of intravenous contrast. Multiplanar CT image reconstructions and MIPs were obtained to evaluate the vascular anatomy. CONTRAST:  51mL ISOVUE-370 IOPAMIDOL (ISOVUE-370) INJECTION 76% COMPARISON:  None. FINDINGS: Cardiovascular: Heart size is normal. No pericardial effusion. Aortic atherosclerosis. Calcification in the left main coronary artery and LAD noted. The main pulmonary artery measures 3.6 cm in transverse diameter. There is a filling defect identified within the right lower lobe lobar pulmonary artery, image 209/7. There is a filling defect within the right upper lobe lobar pulmonary artery, image 90/10. Complete occlusion scratch set segmental pulmonary artery filling defect to a the left upper lobe is identified, image 53/5. There is complete occlusion of the segmental pulmonary artery to the lingula. The RV to LV ratio is equal to 5.1/4.4 equals 1.15. Mediastinum/Nodes: Normal appearance of the thyroid gland. The trachea appears  patent and is midline. Normal appearance of the esophagus. No enlarged axillary or supraclavicular lymph nodes. Prominent mediastinal lymph nodes identified without adenopathy. Lungs/Pleura: Extensive bilateral patchy areas of airspace consolidation and ground-glass attenuation throughout both lungs. No pleural effusion identified. Calcified granuloma within the left apex. Upper Abdomen: No acute abnormality identified. Musculoskeletal: No chest wall abnormality. No acute or significant osseous findings. Unchanged T2 compression deformity. Review of the MIP images confirms the above findings. IMPRESSION: 1. Positive for acute PE with CT evidence of right heart strain (RV/LV Ratio = 1.15) consistent with at least submassive (intermediate risk) PE. The presence of right heart strain has been associated with an increased risk of morbidity and mortality. Please activate Code PE by paging [phone]. 2. Extensive bilateral patchy areas of ground-glass attenuation and airspace consolidation which may represent areas of alveolar edema, hemorrhage and/or infection. 3.  Aortic Atherosclerosis (ICD10-I70.0). 4. Critical Value/emergent results were called by telephone at the time of interpretation on 04/30/2018 at 1:21 pm to Dr. Sherwood Gambler , who verbally acknowledged these results. Electronically Signed   By: Kerby Moors M.D.   On: 04/30/2018 13:21   Ct Renal Stone Study  Result Date: 04/30/2018 CLINICAL DATA:  Left flank pain. EXAM: CT ABDOMEN AND PELVIS WITHOUT CONTRAST TECHNIQUE: Multidetector CT imaging of the abdomen and pelvis was performed following the standard protocol without IV contrast. COMPARISON:  None. FINDINGS: Lower chest: Patchy areas of ground-glass airspace consolidation with associated interstitial thickening in bilateral lower lobes with peripheral predominance. No pleural effusion or  pneumothorax. Enlarged heart. Calcific atherosclerotic disease of the coronary arteries and aorta.  Hepatobiliary: No focal liver abnormality is seen. Status post cholecystectomy. No biliary dilatation. Pancreas: Unremarkable. No pancreatic ductal dilatation or surrounding inflammatory changes. Spleen: Normal in size without focal abnormality. Adrenals/Urinary Tract: Normal adrenal glands. Left-sided circumscribed hypoattenuated renal nodules measuring water density likely represent cysts. No evidence of hydronephrosis or nephrolithiasis. Vascular calcifications noted. Stomach/Bowel: Stomach is within normal limits. Post appendectomy. No evidence of bowel wall thickening, distention, or inflammatory changes. Diffuse colonic diverticulosis without evidence of acute diverticulitis. Vascular/Lymphatic: Aortic atherosclerosis. No enlarged abdominal or pelvic lymph nodes. Reproductive: Status post hysterectomy. No adnexal masses. Other: No abdominal wall hernia or abnormality. No abdominopelvic ascites. Musculoskeletal: Spondylosis of the lumbosacral spine. IMPRESSION: No evidence of nephrolithiasis or hydronephrosis. Diffuse colonic diverticulosis without evidence of acute diverticulitis. Patchy areas of ground-glass attenuation of the lower lobes of the lungs with associated interstitial thickening. Findings may represent atypical pneumonia. Electronically Signed   By: Fidela Salisbury M.D.   On: 04/30/2018 10:27    Procedures .Critical Care Performed by: Sherwood Gambler, MD Authorized by: Sherwood Gambler, MD   Critical care provider statement:    Critical care time (minutes):  35   Critical care time was exclusive of:  Separately billable procedures and treating other patients   Critical care was necessary to treat or prevent imminent or life-threatening deterioration of the following conditions:  Circulatory failure, respiratory failure and cardiac failure   Critical care was time spent personally by me on the following activities:  Development of treatment plan with patient or surrogate, discussions  with consultants, evaluation of patient's response to treatment, examination of patient, obtaining history from patient or surrogate, ordering and performing treatments and interventions, ordering and review of laboratory studies, ordering and review of radiographic studies, pulse oximetry, re-evaluation of patient's condition and review of old charts   (including critical care time)  Medications Ordered in ED Medications  iopamidol (ISOVUE-370) 76 % injection (has no administration in time range)  heparin ADULT infusion 100 units/mL (25000 units/251mL sodium chloride 0.45%) (1,100 Units/hr Intravenous New Bag/Given 04/30/18 1436)  fentaNYL (SUBLIMAZE) injection 50 mcg (50 mcg Intravenous Given 04/30/18 1007)  sodium chloride 0.9 % bolus 500 mL (0 mLs Intravenous Stopped 04/30/18 1108)  aspirin chewable tablet 324 mg (324 mg Oral Given 04/30/18 1007)  iopamidol (ISOVUE-370) 76 % injection 100 mL (75 mLs Intravenous Contrast Given 04/30/18 1240)  heparin bolus via infusion 4,000 Units (4,000 Units Intravenous Bolus from Bag 04/30/18 1437)     Initial Impression / Assessment and Plan / ED Course  I have reviewed the triage vital signs and the nursing notes.  Pertinent labs & imaging results that were available during my care of the patient were reviewed by me and considered in my medical decision making (see chart for details).     Patient's pain is left lower chest/left flank. Given this, CXR, labs, CT stone study obtained. CXR similar to prior when she was diagnosed with pneumonia.  Given presentation and higher suspicion for PE, CT obtained that does show multiple PE and some right heart strain.  Hemodynamically she is stable.  She was placed on IV heparin and hospitalist has been consulted for admission.  Final Clinical Impressions(s) / ED Diagnoses   Final diagnoses:  Acute pulmonary embolism with acute cor pulmonale, unspecified pulmonary embolism type [facility])    ED Discharge Orders     None       Sherwood Gambler, MD 04/30/18 1516

## 2018-04-30 NOTE — ED Notes (Signed)
Patient transported to CT 

## 2018-04-30 NOTE — ED Notes (Signed)
ED Provider at bedside. 

## 2018-04-30 NOTE — ED Notes (Signed)
Attempted report x1. 

## 2018-05-01 ENCOUNTER — Other Ambulatory Visit (HOSPITAL_COMMUNITY): Payer: Medicare Other

## 2018-05-01 ENCOUNTER — Encounter (HOSPITAL_COMMUNITY): Payer: Medicare Other

## 2018-05-01 DIAGNOSIS — F325 Major depressive disorder, single episode, in full remission: Secondary | ICD-10-CM

## 2018-05-01 DIAGNOSIS — I2699 Other pulmonary embolism without acute cor pulmonale: Secondary | ICD-10-CM

## 2018-05-01 DIAGNOSIS — Z96651 Presence of right artificial knee joint: Secondary | ICD-10-CM

## 2018-05-01 LAB — BASIC METABOLIC PANEL
Anion gap: 8 (ref 5–15)
BUN: 9 mg/dL (ref 8–23)
CO2: 26 mmol/L (ref 22–32)
Calcium: 9 mg/dL (ref 8.9–10.3)
Chloride: 109 mmol/L (ref 98–111)
Creatinine, Ser: 0.93 mg/dL (ref 0.44–1.00)
GFR calc Af Amer: >60 mL/min (ref >60)
GFR calc non Af Amer: 60 mL/min — ABNORMAL LOW (ref >60)
Glucose, Bld: 89 mg/dL (ref 70–99)
Potassium: 4.3 mmol/L (ref 3.5–5.1)
Sodium: 143 mmol/L (ref 135–145)

## 2018-05-01 LAB — CBC
HCT: 35.3 % — ABNORMAL LOW (ref 36.0–46.0)
Hemoglobin: 10.6 g/dL — ABNORMAL LOW (ref 12.0–15.0)
MCH: 29.6 pg (ref 26.0–34.0)
MCHC: 30 g/dL (ref 30.0–36.0)
MCV: 98.6 fL (ref 80.0–100.0)
Platelets: 280 10*3/uL (ref 150–400)
RBC: 3.58 MIL/uL — ABNORMAL LOW (ref 3.87–5.11)
RDW: 14.1 % (ref 11.5–15.5)
WBC: 6.5 10*3/uL (ref 4.0–10.5)
nRBC: 0 % (ref 0.0–0.2)

## 2018-05-01 LAB — URINALYSIS, ROUTINE W REFLEX MICROSCOPIC
Bilirubin Urine: NEGATIVE
Glucose, UA: NEGATIVE mg/dL
Hgb urine dipstick: NEGATIVE
Ketones, ur: NEGATIVE mg/dL
Leukocytes, UA: NEGATIVE
Nitrite: NEGATIVE
Protein, ur: NEGATIVE mg/dL
Specific Gravity, Urine: 1.01 (ref 1.005–1.030)
pH: 6 (ref 5.0–8.0)

## 2018-05-01 LAB — HEPARIN LEVEL (UNFRACTIONATED)
Heparin Unfractionated: 0.39 IU/mL (ref 0.30–0.70)
Heparin Unfractionated: 0.29 IU/mL — ABNORMAL LOW (ref 0.30–0.70)
Heparin Unfractionated: 0.72 IU/mL — ABNORMAL HIGH (ref 0.30–0.70)

## 2018-05-01 MED ORDER — IPRATROPIUM-ALBUTEROL 0.5-2.5 (3) MG/3ML IN SOLN
3.0000 mL | Freq: Three times a day (TID) | RESPIRATORY_TRACT | Status: DC
  Administered 2018-05-01 – 2018-05-03 (×9): 3 mL via RESPIRATORY_TRACT
  Filled 2018-05-01 (×11): qty 3

## 2018-05-01 MED ORDER — BENZONATATE 100 MG PO CAPS
200.0000 mg | ORAL_CAPSULE | Freq: Two times a day (BID) | ORAL | Status: DC | PRN
  Administered 2018-05-01 – 2018-05-03 (×3): 200 mg via ORAL
  Filled 2018-05-01 (×3): qty 2

## 2018-05-01 MED ORDER — GUAIFENESIN-DM 100-10 MG/5ML PO SYRP
5.0000 mL | ORAL_SOLUTION | ORAL | Status: DC | PRN
  Administered 2018-05-01 – 2018-05-04 (×5): 5 mL via ORAL
  Filled 2018-05-01 (×5): qty 5

## 2018-05-01 NOTE — Progress Notes (Signed)
ANTICOAGULATION CONSULT NOTE  Pharmacy Consult for Heparin  Indication: pulmonary embolus  Patient Measurements: Heparin Dosing Weight: 68 kg  Vital Signs: Temp: 98 F (36.7 C) (12/05 2354) Temp Source: Axillary (12/05 2354) BP: 104/59 (12/05 2354) Pulse Rate: 89 (12/05 2354)  Labs: Recent Labs    04/30/18 0920 04/30/18 2128  HGB 11.4*  --   HCT 38.4  --   PLT 315  --   HEPARINUNFRC  --  0.20*  CREATININE 1.06*  --   TROPONINI <0.03  --     Estimated Creatinine Clearance: 44.7 mL/min (A) (by C-G formula based on SCr of 1.06 mg/dL (H)).   Medical History: Past Medical History:  Diagnosis Date  . Acute postoperative pain 02/03/2018  . Anemia   . B12 deficiency   . Bacteremia 10/07/2014  . Bladder incontinence   . Blind left eye   . Cancer ([place])    skin cancer   . Cataract   . Cervical spine fracture ([place])   . Chest pain 07/29/2013  . Compression fracture    C7,  upper T spine -compression fx  . COPD (chronic obstructive pulmonary disease) ([place])   . DDD (degenerative disc disease), lumbar   . Depression    major  . Diffuse myofascial pain syndrome 03/24/2015  . Dyspnea    at times- when activty  . Fever 10/05/2014  . GERD (gastroesophageal reflux disease)   . Hiatal hernia   . Hyperlipidemia   . Hypertension   . 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 Macular degeneration   . On home oxygen therapy    "3L; all the time" (04/30/2018)  . Osteoarthritis   . Osteoporosis   . Pneumonia    years ago  . Pulmonary embolism ([place]) 04/30/2018  . Reactive airway disease   . Rupture of bowel ([place])   . Sepsis ([place]) 10/08/2014  . Stroke [facility])    "mini stroke"- balance and memory issue  . Stroke ([place])   . Wrist pain, acute 09/10/2012    Assessment: 66 female admitted with L-sided rib pain. She was just discharged from the hospital last week with pneumonia. CTA shows + for acute PE with R heart strain. hgb 11.4, plts wnl, SCr 1, eCrCl ~ 45 ml/min.    12/6 AM update: initial heparin level low, no issues per RN.   Goal of Therapy:  Heparin level 0.3-0.7 units/ml Monitor platelets by anticoagulation protocol: Yes    Plan:  -Inc heparin to 1250 units/hr -Re-check heparin level in 8 hours  Narda Bonds 05/01/2018,12:02 AM

## 2018-05-01 NOTE — Progress Notes (Addendum)
Nutrition Brief Note  Patient identified on the Malnutrition Screening Tool (MST) Report.  Wt Readings from Last 15 Encounters:  05/01/18 81.6 kg  04/20/18 81.7 kg  04/17/18 81.7 kg  04/13/18 81.6 kg  04/10/18 81.6 kg  04/07/18 81.6 kg  04/06/18 81.6 kg  03/29/18 79.4 kg  03/19/18 77.1 kg  03/17/18 79.4 kg  03/05/18 77.1 kg  02/26/18 76.2 kg  02/26/18 76.2 kg  02/17/18 75.3 kg  02/03/18 79.4 kg   Body mass index is 32.9 kg/m. Patient meets criteria for Obesity Class I based on current BMI.   Current diet order is Clear Liquids. States she's hungry. Waiting for her lunch.    Has Boost Breeze supplements ordered TID. Labs & medications reviewed.   No further nutrition interventions warranted at this time.   If nutrition issues arise, please consult RD.   Arthur Holms, RD, LDN Pager #: [phone] After-Hours Pager #: [phone]

## 2018-05-01 NOTE — Progress Notes (Signed)
ANTICOAGULATION CONSULT NOTE  Pharmacy Consult for Heparin  Indication: pulmonary embolus  Patient Measurements: Heparin Dosing Weight: 68 kg  Vital Signs: Temp: 97.6 F (36.4 C) (12/06 2350) Temp Source: Oral (12/06 2350) BP: 109/61 (12/06 2350) Pulse Rate: 62 (12/06 2350)  Labs: Recent Labs    04/30/18 0920  05/01/18 0723 05/01/18 1356 05/01/18 2315  HGB 11.4*  --  10.6*  --   --   HCT 38.4  --  35.3*  --   --   PLT 315  --  280  --   --   HEPARINUNFRC  --    < > 0.39 0.29* 0.72*  CREATININE 1.06*  --  0.93  --   --   TROPONINI <0.03  --   --   --   --    < > = values in this interval not displayed.    Estimated Creatinine Clearance: 50.9 mL/min (by C-G formula based on SCr of 0.93 mg/dL).   Medical History: Past Medical History:  Diagnosis Date  . Acute postoperative pain 02/03/2018  . Anemia   . B12 deficiency   . Bacteremia 10/07/2014  . Bladder incontinence   . Blind left eye   . Cancer ([place])    skin cancer   . Cataract   . Cervical spine fracture ([place])   . Chest pain 07/29/2013  . Compression fracture    C7,  upper T spine -compression fx  . COPD (chronic obstructive pulmonary disease) ([place])   . DDD (degenerative disc disease), lumbar   . Depression    major  . Diffuse myofascial pain syndrome 03/24/2015  . Dyspnea    at times- when activty  . Fever 10/05/2014  . GERD (gastroesophageal reflux disease)   . Hiatal hernia   . Hyperlipidemia   . Hypertension   . 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 Macular degeneration   . On home oxygen therapy    "3L; all the time" (04/30/2018)  . Osteoarthritis   . Osteoporosis   . Pneumonia    years ago  . Pulmonary embolism ([place]) 04/30/2018  . Reactive airway disease   . Rupture of bowel ([place])   . Sepsis ([place]) 10/08/2014  . Stroke [facility])    "mini stroke"- balance and memory issue  . Stroke ([place])   . Wrist pain, acute 09/10/2012    Assessment: 42 female admitted with L-sided rib pain. She  was just discharged from the hospital last week with pneumonia. CTA shows + for acute PE with R heart strain. hgb 11.4, plts wnl, SCr 1, eCrCl ~ 45 ml/min.   12/6 PM update: heparin level elevated tonight, no issues per RN.   Goal of Therapy:  Heparin level 0.3-0.7 units/ml Monitor platelets by anticoagulation protocol: Yes    Plan:  -Dec heparin to 1300 units/hr -Heparin level with AM labs  Narda Bonds 05/01/2018,11:57 PM

## 2018-05-01 NOTE — Progress Notes (Signed)
PROGRESS NOTE    [name]  ZOX:[id number] DOB: 08-17-1940 DOA: 04/30/2018 PCP: Alvester Chou, NP    Brief Narrative:  77 y.o. female, 3 significant for COPD/pneumonia, hyperlipidemia and hypertension who was recently discharged from our facility on 1130 after respiratory failure, pneumonia, UTI.  Patient was treated with IV antibiotics, nebulizer treatment and was discharged home on 3 L of nasal cannula.  The patient is presenting today for 1 day history of acute chest pain with shortness of breath.  The chest pain was on the left, worse with deep breath.  She took a baby aspirin prior to arrival. In the emergency room CT of the chest showed bilateral, multiple small PEs that involve the right lower lobe right upper lobe and lingula with extensive airspace disease involving both lower lobes.  Patient denies any fever or chills.  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)   Osteoarthritis   Spondylosis without myelopathy or radiculopathy, lumbosacral region  Pulmonary embolism with right ventricular strain -continued on heparin gtt -LE dopplers and 2d echo ordered, pending -currently on 3LNC  History of respiratory failure and COPD -Presently no wheezing on auscultation -continue with duo nebs as needed  Hypertension -Continue with amlodipine -BP stable  Anxiety -Continue with Xanax/Celexa -Appears stable presently  Hyperlipidemia -Continue with statin as tolerated  GERD -Continue with PPI as tolerated -Stable   DVT prophylaxis: Heparin gtt Code Status: Full Family Communication: Pt in room, family not at bedside Disposition Plan: Uncertain at this time  Consultants:     Procedures:     Antimicrobials: Anti-infectives (From admission, onward)   None      Subjective: No chest pain,  still sob  Objective: Vitals:   05/01/18 0859 05/01/18 1215 05/01/18 1336 05/01/18 1339  BP:  135/68    Pulse: 65 87    Resp: 18 18    Temp:  99 F (37.2 C)    TempSrc:  Oral    SpO2: 97% 94%  97%  Weight:   81.6 kg   Height:   5\' 2"  (1.575 m)     Intake/Output Summary (Last 24 hours) at 05/01/2018 1503 Last data filed at 05/01/2018 1300 Gross per 24 hour  Intake 614 ml  Output 425 ml  Net 189 ml   Filed Weights   05/01/18 1336  Weight: 81.6 kg    Examination:  General exam: Appears calm and comfortable  Respiratory system: Clear to auscultation. Respiratory effort normal. Cardiovascular system: S1 & S2 heard, RRR Gastrointestinal system: Abdomen is nondistended, soft and nontender. No organomegaly or masses felt. Normal bowel sounds heard. Central nervous system: Alert and oriented. No focal neurological deficits. Extremities: Symmetric 5 x 5 power. Skin: No rashes, lesions Psychiatry: Judgement and insight appear normal. Mood & affect appropriate.   Data Reviewed: I have personally reviewed following labs and imaging studies  CBC: Recent Labs  Lab 04/30/18 0920 05/01/18 0723  WBC 8.5 6.5  NEUTROABS 6.3  --   HGB 11.4* 10.6*  HCT 38.4 35.3*  MCV 97.7 98.6  PLT 315 811   Basic Metabolic Panel: Recent Labs  Lab 04/30/18 0920 05/01/18 0723  NA 140 143  K 3.4* 4.3  CL 108 109  CO2 25 26  GLUCOSE 87 89  BUN 11 9  CREATININE 1.06* 0.93  CALCIUM 9.3 9.0  GFR: Estimated Creatinine Clearance: 50.9 mL/min (by C-G formula based on SCr of 0.93 mg/dL). Liver Function Tests: Recent Labs  Lab 04/30/18 0920  AST 27  ALT 25  ALKPHOS 51  BILITOT 0.5  PROT 6.4*  ALBUMIN 2.2*   Recent Labs  Lab 04/30/18 0920  LIPASE 21   No results for input(s): AMMONIA in the last 168 hours. Coagulation Profile: No results for input(s): INR, PROTIME in the last 168 hours. Cardiac Enzymes: Recent Labs  Lab 04/30/18 0920  TROPONINI <0.03   BNP (last 3  results) No results for input(s): PROBNP in the last 8760 hours. HbA1C: No results for input(s): HGBA1C in the last 72 hours. CBG: No results for input(s): GLUCAP in the last 168 hours. Lipid Profile: No results for input(s): CHOL, HDL, LDLCALC, TRIG, CHOLHDL, LDLDIRECT in the last 72 hours. Thyroid Function Tests: No results for input(s): TSH, T4TOTAL, FREET4, T3FREE, THYROIDAB in the last 72 hours. Anemia Panel: No results for input(s): VITAMINB12, FOLATE, FERRITIN, TIBC, IRON, RETICCTPCT in the last 72 hours. Sepsis Labs: No results for input(s): PROCALCITON, LATICACIDVEN in the last 168 hours.  Recent Results (from the past 240 hour(s))  Culture, Urine     Status: Abnormal   Collection Time: 04/21/18  6:05 PM  Result Value Ref Range Status   Specimen Description URINE, RANDOM  Final   Special Requests   Final    NONE Performed at [facility], [address] [place], [place] [address]    Culture (A)  Final    >=100,000 COLONIES/mL VIRIDANS STREPTOCOCCUS 70,000 COLONIES/mL CITROBACTER FREUNDII    Report Status 04/24/2018 FINAL  Final   Organism ID, Bacteria CITROBACTER FREUNDII (A)  Final      Susceptibility   Citrobacter freundii - MIC*    CEFAZOLIN >=64 RESISTANT Resistant     CEFTRIAXONE <=1 SENSITIVE Sensitive     CIPROFLOXACIN <=0.25 SENSITIVE Sensitive     GENTAMICIN <=1 SENSITIVE Sensitive     IMIPENEM <=0.25 SENSITIVE Sensitive     NITROFURANTOIN <=16 SENSITIVE Sensitive     TRIMETH/SULFA <=20 SENSITIVE Sensitive     PIP/TAZO <=4 SENSITIVE Sensitive     * 70,000 COLONIES/mL CITROBACTER FREUNDII     Radiology Studies: Dg Chest 2 View  Result Date: 04/30/2018 CLINICAL DATA:  Left-sided chest pain and shortness of breath. EXAM: CHEST - 2 VIEW COMPARISON:  04/22/2018 FINDINGS: Cardiomediastinal silhouette is normal. Mediastinal contours appear intact. Tortuosity and calcific atherosclerotic disease of the aorta. There is no evidence of pleural effusion or  pneumothorax. Bilateral patchy airspace consolidation with peripheral predominance with coarsening of the interstitial markings. Osseous structures are without acute abnormality. Soft tissues are grossly normal. IMPRESSION: Bilateral patchy airspace consolidation with peripheral predominance with coarsening of the interstitial markings. The aeration of the lungs has worsened when compared to the prior radiographs. Electronically Signed   By: Fidela Salisbury M.D.   On: 04/30/2018 09:55   Ct Angio Chest Pe W/cm &/or Wo Cm  Result Date: 04/30/2018 CLINICAL DATA:  Pulmonary embolus suspected. High probability. Left-sided rib pain. To kidney a and tachycardia. EXAM: CT ANGIOGRAPHY CHEST WITH CONTRAST TECHNIQUE: Multidetector CT imaging of the chest was performed using the standard protocol during bolus administration of intravenous contrast. Multiplanar CT image reconstructions and MIPs were obtained to evaluate the vascular anatomy. CONTRAST:  65mL ISOVUE-370 IOPAMIDOL (ISOVUE-370) INJECTION 76% COMPARISON:  None. FINDINGS: Cardiovascular: Heart size is normal. No pericardial effusion. Aortic atherosclerosis. Calcification in the left main coronary artery and LAD noted.  The main pulmonary artery measures 3.6 cm in transverse diameter. There is a filling defect identified within the right lower lobe lobar pulmonary artery, image 209/7. There is a filling defect within the right upper lobe lobar pulmonary artery, image 90/10. Complete occlusion scratch set segmental pulmonary artery filling defect to a the left upper lobe is identified, image 53/5. There is complete occlusion of the segmental pulmonary artery to the lingula. The RV to LV ratio is equal to 5.1/4.4 equals 1.15. Mediastinum/Nodes: Normal appearance of the thyroid gland. The trachea appears patent and is midline. Normal appearance of the esophagus. No enlarged axillary or supraclavicular lymph nodes. Prominent mediastinal lymph nodes identified  without adenopathy. Lungs/Pleura: Extensive bilateral patchy areas of airspace consolidation and ground-glass attenuation throughout both lungs. No pleural effusion identified. Calcified granuloma within the left apex. Upper Abdomen: No acute abnormality identified. Musculoskeletal: No chest wall abnormality. No acute or significant osseous findings. Unchanged T2 compression deformity. Review of the MIP images confirms the above findings. IMPRESSION: 1. Positive for acute PE with CT evidence of right heart strain (RV/LV Ratio = 1.15) consistent with at least submassive (intermediate risk) PE. The presence of right heart strain has been associated with an increased risk of morbidity and mortality. Please activate Code PE by paging [phone]. 2. Extensive bilateral patchy areas of ground-glass attenuation and airspace consolidation which may represent areas of alveolar edema, hemorrhage and/or infection. 3.  Aortic Atherosclerosis (ICD10-I70.0). 4. Critical Value/emergent results were called by telephone at the time of interpretation on 04/30/2018 at 1:21 pm to Dr. Sherwood Gambler , who verbally acknowledged these results. Electronically Signed   By: Kerby Moors M.D.   On: 04/30/2018 13:21   Ct Renal Stone Study  Result Date: 04/30/2018 CLINICAL DATA:  Left flank pain. EXAM: CT ABDOMEN AND PELVIS WITHOUT CONTRAST TECHNIQUE: Multidetector CT imaging of the abdomen and pelvis was performed following the standard protocol without IV contrast. COMPARISON:  None. FINDINGS: Lower chest: Patchy areas of ground-glass airspace consolidation with associated interstitial thickening in bilateral lower lobes with peripheral predominance. No pleural effusion or pneumothorax. Enlarged heart. Calcific atherosclerotic disease of the coronary arteries and aorta. Hepatobiliary: No focal liver abnormality is seen. Status post cholecystectomy. No biliary dilatation. Pancreas: Unremarkable. No pancreatic ductal dilatation or  surrounding inflammatory changes. Spleen: Normal in size without focal abnormality. Adrenals/Urinary Tract: Normal adrenal glands. Left-sided circumscribed hypoattenuated renal nodules measuring water density likely represent cysts. No evidence of hydronephrosis or nephrolithiasis. Vascular calcifications noted. Stomach/Bowel: Stomach is within normal limits. Post appendectomy. No evidence of bowel wall thickening, distention, or inflammatory changes. Diffuse colonic diverticulosis without evidence of acute diverticulitis. Vascular/Lymphatic: Aortic atherosclerosis. No enlarged abdominal or pelvic lymph nodes. Reproductive: Status post hysterectomy. No adnexal masses. Other: No abdominal wall hernia or abnormality. No abdominopelvic ascites. Musculoskeletal: Spondylosis of the lumbosacral spine. IMPRESSION: No evidence of nephrolithiasis or hydronephrosis. Diffuse colonic diverticulosis without evidence of acute diverticulitis. Patchy areas of ground-glass attenuation of the lower lobes of the lungs with associated interstitial thickening. Findings may represent atypical pneumonia. Electronically Signed   By: Fidela Salisbury M.D.   On: 04/30/2018 10:27    Scheduled Meds: . amLODipine  5 mg Oral Daily  . budesonide  1 mg Nebulization BID  . citalopram  20 mg Oral Daily  . feeding supplement  1 Container Oral TID BM  . ipratropium-albuterol  3 mL Nebulization TID  . pantoprazole  40 mg Oral Daily  . pravastatin  80 mg Oral q1800  . pregabalin  75  mg Oral BID  . traZODone  100 mg Oral QHS   Continuous Infusions: . heparin 1,250 Units/hr (05/01/18 0831)     LOS: 1 day   Marylu Lund, MD [facility] Pager On Amion  If 7PM-7AM, please contact night-coverage 05/01/2018, 3:03 PM

## 2018-05-01 NOTE — Progress Notes (Addendum)
ANTICOAGULATION CONSULT NOTE  Pharmacy Consult for Heparin  Indication: pulmonary embolus  Patient Measurements: Heparin Dosing Weight: 68 kg  Vital Signs: Temp: 98.3 F (36.8 C) (12/06 0753) Temp Source: Oral (12/06 0753) BP: 103/63 (12/06 0753) Pulse Rate: 65 (12/06 0859)  Labs: Recent Labs    04/30/18 0920 04/30/18 2128 05/01/18 0723  HGB 11.4*  --  10.6*  HCT 38.4  --  35.3*  PLT 315  --  280  HEPARINUNFRC  --  0.20* 0.39  CREATININE 1.06*  --  0.93  TROPONINI <0.03  --   --     Estimated Creatinine Clearance: 50.9 mL/min (by C-G formula based on SCr of 0.93 mg/dL).  Assessment: 71 female admitted with L-sided rib pain. She was just discharged from the hospital last week with pneumonia. CTA positive for acute PE with R heart strain.   Heparin level now therapeutic. Hgb down to 10.6. No bleeding noted.  Goal of Therapy:  Heparin level 0.3-0.7 units/ml Monitor platelets by anticoagulation protocol: Yes   Plan:  -Continue heparin 1250 units/hr -Will f/u confirmatory heparin level in 6 hours as pt with PE  Sherlon Handing, PharmD, BCPS Clinical pharmacist  **Pharmacist phone directory can now be found on [email or web address] ([email or web address]).  Listed under [place]. 05/01/2018,10:51 AM   Addendum (1520): Heparin level now down to 0.29 (subtherapeutic) on gtt at 1250 units/hr. No issues with line or bleeding reported per RN.  Plan: Increase heparin to 1400 units/hr Will f/u 8 hr heparin level  Sherlon Handing, PharmD, BCPS Clinical pharmacist 05/01/2018 3:21 PM

## 2018-05-02 ENCOUNTER — Inpatient Hospital Stay (HOSPITAL_COMMUNITY): Payer: Medicare Other

## 2018-05-02 DIAGNOSIS — I2699 Other pulmonary embolism without acute cor pulmonale: Secondary | ICD-10-CM

## 2018-05-02 DIAGNOSIS — I361 Nonrheumatic tricuspid (valve) insufficiency: Secondary | ICD-10-CM

## 2018-05-02 DIAGNOSIS — I340 Nonrheumatic mitral (valve) insufficiency: Secondary | ICD-10-CM

## 2018-05-02 LAB — CBC
HCT: 37 % (ref 36.0–46.0)
Hemoglobin: 11 g/dL — ABNORMAL LOW (ref 12.0–15.0)
MCH: 29.3 pg (ref 26.0–34.0)
MCHC: 29.7 g/dL — ABNORMAL LOW (ref 30.0–36.0)
MCV: 98.7 fL (ref 80.0–100.0)
Platelets: 341 10*3/uL (ref 150–400)
RBC: 3.75 MIL/uL — ABNORMAL LOW (ref 3.87–5.11)
RDW: 14 % (ref 11.5–15.5)
WBC: 6.7 10*3/uL (ref 4.0–10.5)
nRBC: 0 % (ref 0.0–0.2)

## 2018-05-02 LAB — ECHOCARDIOGRAM COMPLETE
Height: 62 in
Weight: 2878.33 oz

## 2018-05-02 LAB — HEPARIN LEVEL (UNFRACTIONATED)
Heparin Unfractionated: 0.67 IU/mL (ref 0.30–0.70)
Heparin Unfractionated: 0.71 IU/mL — ABNORMAL HIGH (ref 0.30–0.70)

## 2018-05-02 MED ORDER — APIXABAN 5 MG PO TABS
10.0000 mg | ORAL_TABLET | Freq: Two times a day (BID) | ORAL | Status: DC
  Administered 2018-05-02 – 2018-05-04 (×4): 10 mg via ORAL
  Filled 2018-05-02 (×5): qty 2

## 2018-05-02 MED ORDER — APIXABAN 5 MG PO TABS: 5.0000 mg | ORAL_TABLET | Freq: Two times a day (BID) | ORAL | Status: DC

## 2018-05-02 NOTE — Progress Notes (Addendum)
ANTICOAGULATION CONSULT NOTE  Pharmacy Consult for Heparin > apixaban Indication: pulmonary embolus  Patient Measurements: Heparin Dosing Weight: 68 kg  Vital Signs: Temp: 98.8 F (37.1 C) (12/07 0806) Temp Source: Oral (12/07 0806) BP: 110/67 (12/07 0806) Pulse Rate: 68 (12/07 0806)  Labs: Recent Labs    04/30/18 0920  05/01/18 0723  05/01/18 2315 05/02/18 0311 05/02/18 1225  HGB 11.4*  --  10.6*  --   --  11.0*  --   HCT 38.4  --  35.3*  --   --  37.0  --   PLT 315  --  280  --   --  341  --   HEPARINUNFRC  --    < > 0.39   < > 0.72* 0.67 0.71*  CREATININE 1.06*  --  0.93  --   --   --   --   TROPONINI <0.03  --   --   --   --   --   --    < > = values in this interval not displayed.    Estimated Creatinine Clearance: 50.9 mL/min (by C-G formula based on SCr of 0.93 mg/dL).   Medical History: Past Medical History:  Diagnosis Date  . Acute postoperative pain 02/03/2018  . Anemia   . B12 deficiency   . Bacteremia 10/07/2014  . Bladder incontinence   . Blind left eye   . Cancer ([place])    skin cancer   . Cataract   . Cervical spine fracture ([place])   . Chest pain 07/29/2013  . Compression fracture    C7,  upper T spine -compression fx  . COPD (chronic obstructive pulmonary disease) ([place])   . DDD (degenerative disc disease), lumbar   . Depression    major  . Diffuse myofascial pain syndrome 03/24/2015  . Dyspnea    at times- when activty  . Fever 10/05/2014  . GERD (gastroesophageal reflux disease)   . Hiatal hernia   . Hyperlipidemia   . Hypertension   . 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 Macular degeneration   . On home oxygen therapy    "3L; all the time" (04/30/2018)  . Osteoarthritis   . Osteoporosis   . Pneumonia    years ago  . Pulmonary embolism ([place]) 04/30/2018  . Reactive airway disease   . Rupture of bowel ([place])   . Sepsis ([place]) 10/08/2014  . Stroke [facility])    "mini stroke"- balance and memory issue  . Stroke ([place])   . Wrist  pain, acute 09/10/2012    Assessment: 58 female admitted with L-sided rib pain. She was just discharged from the hospital last week with pneumonia. CTA shows + for acute PE with R heart strain, also bilateral LE DVT. CBC stable.  Heparin level now slightly high at 0.71. No bleeding or issues with infusion per discussion with RN.  Goal of Therapy:  Heparin level 0.3-0.7 units/ml Monitor platelets by anticoagulation protocol: Yes    Plan:  Reduce heparin to 1250 units/hr 8h heparin level Monitor daily heparin level and CBC, s/sx bleeding F/u plan for long-term anticoagulation  Elicia Lamp, PharmD, BCPS Clinical Pharmacist Clinical phone [phone] Please check AMION for all [place] contact numbers 05/02/2018 1:35 PM   ADDENDUM: Pharmacy consulted to transition to apixaban. No bleed issues reported.  Plan: D/c heparin at time of 1st dose of apixaban - communicated plan with RN Apixaban 10mg  PO BID x 7 days; then 5mg  PO BID Monitor CBC, s/sx bleeding  Elicia Lamp, PharmD, BCPS Please check AMION for all [place] contact numbers Clinical Pharmacist 05/02/2018 4:18 PM

## 2018-05-02 NOTE — Progress Notes (Signed)
ANTICOAGULATION CONSULT NOTE  Pharmacy Consult for Heparin  Indication: pulmonary embolus  Patient Measurements: Heparin Dosing Weight: 68 kg  Vital Signs: Temp: 98 F (36.7 C) (12/07 0326) Temp Source: Oral (12/07 0326) BP: 122/73 (12/07 0326) Pulse Rate: 67 (12/07 0326)  Labs: Recent Labs    04/30/18 0920  05/01/18 0723 05/01/18 1356 05/01/18 2315 05/02/18 0311  HGB 11.4*  --  10.6*  --   --  11.0*  HCT 38.4  --  35.3*  --   --  37.0  PLT 315  --  280  --   --  341  HEPARINUNFRC  --    < > 0.39 0.29* 0.72* 0.67  CREATININE 1.06*  --  0.93  --   --   --   TROPONINI <0.03  --   --   --   --   --    < > = values in this interval not displayed.    Estimated Creatinine Clearance: 50.9 mL/min (by C-G formula based on SCr of 0.93 mg/dL).   Medical History: Past Medical History:  Diagnosis Date  . Acute postoperative pain 02/03/2018  . Anemia   . B12 deficiency   . Bacteremia 10/07/2014  . Bladder incontinence   . Blind left eye   . Cancer ([place])    skin cancer   . Cataract   . Cervical spine fracture ([place])   . Chest pain 07/29/2013  . Compression fracture    C7,  upper T spine -compression fx  . COPD (chronic obstructive pulmonary disease) ([employer])   . DDD (degenerative disc disease), lumbar   . Depression    major  . Diffuse myofascial pain syndrome 03/24/2015  . Dyspnea    at times- when activty  . Fever 10/05/2014  . GERD (gastroesophageal reflux disease)   . Hiatal hernia   . Hyperlipidemia   . Hypertension   . 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 Macular degeneration   . On home oxygen therapy    "3L; all the time" (04/30/2018)  . Osteoarthritis   . Osteoporosis   . Pneumonia    years ago  . Pulmonary embolism ([place]) 04/30/2018  . Reactive airway disease   . Rupture of bowel ([place])   . Sepsis ([place]) 10/08/2014  . Stroke [facility])    "mini stroke"- balance and memory issue  . Stroke ([employer])   . Wrist pain, acute 09/10/2012    Assessment: 32  female admitted with L-sided rib pain. She was just discharged from the hospital last week with pneumonia. CTA shows + for acute PE with R heart strain. hgb 11.4, plts wnl, SCr 1, eCrCl ~ 45 ml/min.   12/7 AM update: heparin level therapeutic x 1 after rate decrease  Goal of Therapy:  Heparin level 0.3-0.7 units/ml Monitor platelets by anticoagulation protocol: Yes    Plan:  -Cont heparin at 1300 units/hr -Heparin level at 1200  Narda Bonds 05/02/2018,4:35 AM

## 2018-05-02 NOTE — Progress Notes (Signed)
Bilateral lower extremities venous duplex exam completed. Findings consistent with positive age indeterminate deep vein thrombosis involving the right posterior tibial vein, and right peroneal vein. Positive acute deep vein thrombosis involving the left posterior tibial vein, and left peroneal vein.  Please see preliminary notes on CV PROC under chart review.  Jahred Tatar H Jerzee Jerome(RDMS RVT) 05/02/18 12:01 PM

## 2018-05-02 NOTE — Progress Notes (Addendum)
PROGRESS NOTE    [name]  PZW:[id number] DOB: 09-02-1940 DOA: 04/30/2018 PCP: Alvester Chou, NP    Brief Narrative:  77 y.o. female, 3 significant for COPD/pneumonia, hyperlipidemia and hypertension who was recently discharged from our facility on 1130 after respiratory failure, pneumonia, UTI.  Patient was treated with IV antibiotics, nebulizer treatment and was discharged home on 3 L of nasal cannula.  The patient is presenting today for 1 day history of acute chest pain with shortness of breath.  The chest pain was on the left, worse with deep breath.  She took a baby aspirin prior to arrival. In the emergency room CT of the chest showed bilateral, multiple small PEs that involve the right lower lobe right upper lobe and lingula with extensive airspace disease involving both lower lobes.  Patient denies any fever or chills.  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 Will transition to eliquis -LE dopplers and 2d echo ordered. BLE DVT noted. RV systolic function normal -currently on 2LNC, wean O2 as tolerated  chronic respiratory failure and COPD -Presently no wheezing on auscultation -continue with duo nebs as needed -down to [facility] per above  Hypertension -Continue with amlodipine -BP stable and controlled  Anxiety -Continue with Xanax/Celexa -Appears stable presently  Hyperlipidemia -Continue on statin as tolerated  GERD -Continue with PPI as tolerated -Presently stable   DVT prophylaxis: Heparin gtt Code Status: Full Family Communication: Pt in room, family not at bedside Disposition Plan: Uncertain at this  time  Consultants:     Procedures:     Antimicrobials: Anti-infectives (From admission, onward)   None      Subjective: Without chest pain this AM  Objective: Vitals:   05/02/18 0806 05/02/18 1300 05/02/18 1422 05/02/18 1529  BP: 110/67 125/66    Pulse: 68 80    Resp: 18 (!) 27  19  Temp: 98.8 F (37.1 C) 98 F (36.7 C)    TempSrc: Oral Oral    SpO2: 97% 94% 98%   Weight:      Height:        Intake/Output Summary (Last 24 hours) at 05/02/2018 1625 Last data filed at 05/02/2018 1338 Gross per 24 hour  Intake 1044 ml  Output 650 ml  Net 394 ml   Filed Weights   05/01/18 1336  Weight: 81.6 kg    Examination: General exam: Awake, laying in bed, in nad Respiratory system: Normal respiratory effort, no wheezing Cardiovascular system: regular rate, s1, s2 Gastrointestinal system: Soft, nondistended, positive BS Central nervous system: CN2-12 grossly intact, strength intact Extremities: Perfused, no clubbing Skin: Normal skin turgor, no notable skin lesions seen Psychiatry: Mood normal // no visual hallucinations   Data Reviewed: I have personally reviewed following labs and imaging studies  CBC: Recent Labs  Lab 04/30/18 0920 05/01/18 0723 05/02/18 0311  WBC 8.5 6.5 6.7  NEUTROABS 6.3  --   --   HGB 11.4* 10.6* 11.0*  HCT 38.4 35.3* 37.0  MCV 97.7 98.6 98.7  PLT 315 280 423   Basic Metabolic Panel: Recent Labs  Lab 04/30/18 0920 05/01/18 0723  NA 140 143  K 3.4* 4.3  CL 108 109  CO2 25 26  GLUCOSE 87 89  BUN 11 9  CREATININE 1.06* 0.93  CALCIUM 9.3 9.0   GFR: Estimated Creatinine Clearance: 50.9 mL/min (by C-G formula based on SCr of 0.93 mg/dL). Liver Function Tests: Recent Labs  Lab 04/30/18 0920  AST 27  ALT 25  ALKPHOS 51  BILITOT 0.5  PROT 6.4*  ALBUMIN 2.2*   Recent Labs  Lab 04/30/18 0920  LIPASE 21   No results for input(s): AMMONIA in the last 168 hours. Coagulation Profile: No results for input(s): INR, PROTIME  in the last 168 hours. Cardiac Enzymes: Recent Labs  Lab 04/30/18 0920  TROPONINI <0.03   BNP (last 3 results) No results for input(s): PROBNP in the last 8760 hours. HbA1C: No results for input(s): HGBA1C in the last 72 hours. CBG: No results for input(s): GLUCAP in the last 168 hours. Lipid Profile: No results for input(s): CHOL, HDL, LDLCALC, TRIG, CHOLHDL, LDLDIRECT in the last 72 hours. Thyroid Function Tests: No results for input(s): TSH, T4TOTAL, FREET4, T3FREE, THYROIDAB in the last 72 hours. Anemia Panel: No results for input(s): VITAMINB12, FOLATE, FERRITIN, TIBC, IRON, RETICCTPCT in the last 72 hours. Sepsis Labs: No results for input(s): PROCALCITON, LATICACIDVEN in the last 168 hours.  No results found for this or any previous visit (from the past 240 hour(s)).   Radiology Studies: Vas [place] Lower Extremity Venous (dvt)  Result Date: 05/02/2018  Lower Venous Study Indications: Pulmonary embolism.  Risk Factors: Confirmed PE. Limitations: Body habitus. Comparison Study: CTA on 04/30/2018 Performing Technologist: Lorina Rabon  Examination Guidelines: A complete evaluation includes B-mode imaging, spectral Doppler, color Doppler, and power Doppler as needed of all accessible portions of each vessel. Bilateral testing is considered an integral part of a complete examination. Limited examinations for reoccurring indications may be performed as noted.  Right Venous Findings: +---------+---------------+---------+-----------+----------+-----------------+          CompressibilityPhasicitySpontaneityPropertiesSummary           +---------+---------------+---------+-----------+----------+-----------------+ CFV      Full           Yes      Yes                                    +---------+---------------+---------+-----------+----------+-----------------+ SFJ      Full                                                            +---------+---------------+---------+-----------+----------+-----------------+ FV Prox  Full                                                           +---------+---------------+---------+-----------+----------+-----------------+ FV Mid   Full                                                           +---------+---------------+---------+-----------+----------+-----------------+ FV DistalFull                                                           +---------+---------------+---------+-----------+----------+-----------------+  PFV      Full                                                           +---------+---------------+---------+-----------+----------+-----------------+ POP      Full           Yes      Yes                                    +---------+---------------+---------+-----------+----------+-----------------+ PTV      None                    No                   Age Indeterminate +---------+---------------+---------+-----------+----------+-----------------+ PERO     None                    No                   Age Indeterminate +---------+---------------+---------+-----------+----------+-----------------+  Left Venous Findings: +---------+---------------+---------+-----------+----------+-------+          CompressibilityPhasicitySpontaneityPropertiesSummary +---------+---------------+---------+-----------+----------+-------+ CFV      Full           Yes      Yes                          +---------+---------------+---------+-----------+----------+-------+ SFJ      Full                                                 +---------+---------------+---------+-----------+----------+-------+ FV Prox  Full                                                 +---------+---------------+---------+-----------+----------+-------+ FV Mid   Full                                                  +---------+---------------+---------+-----------+----------+-------+ FV DistalFull                                                 +---------+---------------+---------+-----------+----------+-------+ PFV      Full                                                 +---------+---------------+---------+-----------+----------+-------+ POP      Full           Yes      Yes                          +---------+---------------+---------+-----------+----------+-------+  PTV      None                                         Acute   +---------+---------------+---------+-----------+----------+-------+ PERO     None                                         Acute   +---------+---------------+---------+-----------+----------+-------+    Summary: Right: Findings consistent with age indeterminate deep vein thrombosis involving the right posterior tibial vein, and right peroneal vein. No cystic structure found in the popliteal fossa. Left: Findings consistent with acute deep vein thrombosis involving the left posterior tibial vein, and left peroneal vein. No cystic structure found in the popliteal fossa.  *See table(s) above for measurements and observations.    Preliminary     Scheduled Meds: . amLODipine  5 mg Oral Daily  . apixaban  10 mg Oral BID   Followed by  . [START ON 05/09/2018] apixaban  5 mg Oral BID  . budesonide  1 mg Nebulization BID  . citalopram  20 mg Oral Daily  . feeding supplement  1 Container Oral TID BM  . ipratropium-albuterol  3 mL Nebulization TID  . pantoprazole  40 mg Oral Daily  . pravastatin  80 mg Oral q1800  . pregabalin  75 mg Oral BID  . traZODone  100 mg Oral QHS   Continuous Infusions:    LOS: 2 days   Marylu Lund, MD [facility] Pager On Amion  If 7PM-7AM, please contact night-coverage 05/02/2018, 4:25 PM

## 2018-05-02 NOTE — Progress Notes (Signed)
  Echocardiogram 2D Echocardiogram has been performed.  [name] 05/02/2018, 10:34 AM

## 2018-05-03 ENCOUNTER — Inpatient Hospital Stay (HOSPITAL_COMMUNITY): Payer: Medicare Other

## 2018-05-03 DIAGNOSIS — M150 Primary generalized (osteo)arthritis: Secondary | ICD-10-CM

## 2018-05-03 LAB — URINALYSIS, ROUTINE W REFLEX MICROSCOPIC
Bilirubin Urine: NEGATIVE
Glucose, UA: NEGATIVE mg/dL
Hgb urine dipstick: NEGATIVE
Ketones, ur: NEGATIVE mg/dL
Nitrite: NEGATIVE
Protein, ur: NEGATIVE mg/dL
Specific Gravity, Urine: 1.005 (ref 1.005–1.030)
pH: 8 (ref 5.0–8.0)

## 2018-05-03 LAB — CBC
HCT: 38.1 % (ref 36.0–46.0)
Hemoglobin: 11.6 g/dL — ABNORMAL LOW (ref 12.0–15.0)
MCH: 29.9 pg (ref 26.0–34.0)
MCHC: 30.4 g/dL (ref 30.0–36.0)
MCV: 98.2 fL (ref 80.0–100.0)
Platelets: 349 10*3/uL (ref 150–400)
RBC: 3.88 MIL/uL (ref 3.87–5.11)
RDW: 14.1 % (ref 11.5–15.5)
WBC: 5.8 10*3/uL (ref 4.0–10.5)
nRBC: 0 % (ref 0.0–0.2)

## 2018-05-03 LAB — BLOOD GAS, ARTERIAL
Acid-Base Excess: 6.2 mmol/L — ABNORMAL HIGH (ref 0.0–2.0)
Bicarbonate: 30.1 mmol/L — ABNORMAL HIGH (ref 20.0–28.0)
Drawn by: 331001
O2 Content: 1.5 L/min
O2 Saturation: 92.8 %
Patient temperature: 98.4
pCO2 arterial: 42.2 mmHg (ref 32.0–48.0)
pH, Arterial: 7.466 — ABNORMAL HIGH (ref 7.350–7.450)
pO2, Arterial: 63.2 mmHg — ABNORMAL LOW (ref 83.0–108.0)

## 2018-05-03 MED ORDER — LABETALOL HCL 5 MG/ML IV SOLN: 5.0000 mg | INTRAVENOUS | Status: DC | PRN

## 2018-05-03 MED ORDER — METOPROLOL TARTRATE 25 MG PO TABS
25.0000 mg | ORAL_TABLET | Freq: Two times a day (BID) | ORAL | Status: DC
  Administered 2018-05-03 – 2018-05-04 (×3): 25 mg via ORAL
  Filled 2018-05-03 (×3): qty 1

## 2018-05-03 MED ORDER — HALOPERIDOL LACTATE 5 MG/ML IJ SOLN
1.0000 mg | Freq: Once | INTRAMUSCULAR | Status: AC
  Administered 2018-05-03: 1 mg via INTRAVENOUS
  Filled 2018-05-03: qty 1

## 2018-05-03 NOTE — Progress Notes (Deleted)
Patient's Name: [name]  MRN: [id number]  Referring Provider: Alvester Chou, NP  DOB: 09-08-1940  PCP: Alvester Chou, NP  DOS: 05/06/2018  Note by: Gaspar Cola, MD  Service setting: Ambulatory outpatient  Specialty: Interventional Pain Management  Location: [facility] (AMB) Pain Management Facility    Patient type: Established   Primary Reason(s) for Visit: Encounter for post-procedure evaluation of chronic illness with mild to moderate exacerbation CC: No chief complaint on file.  HPI  [name] is a 77 y.o. year old, female patient, who comes today for a post-procedure evaluation. 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[place]); Hypertensive kidney disease, malignant; Chronic low back pain (Tertiary Area of Pain) (Bilateral) (L>R); CAFL (chronic airflow limitation) ([place]); Apoplectic vertigo; Duodenogastric reflux; History of stroke; Hoarseness; Benign essential HTN; Chronic knee pain (Primary Area of Pain) (Right); Depression, major, in partial remission ([place]); Major depression in remission ([place]); Involutional osteoporosis; Pharyngoesophageal dysphagia; 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); Hx of total knee replacement (Bilateral); Infection of the upper respiratory tract; Bed wetting; UTI (urinary tract infection); Long term current use of opiate analgesic; Long term prescription opiate use; Chronic pain syndrome; Chronic neck pain (Secondary Area of Pain) (Bilateral) (L>R); Chronic hip pain (Right); Rib pain; DDD (degenerative disc disease), cervical; Lumbar facet arthropathy (Bilateral); Chronic  myofascial pain; Lumbar facet syndrome (Bilateral) (L>R); Long term prescription benzodiazepine use; Opiate use; Disorder of skeletal system; Pharmacologic therapy; Problems influencing health status; Opioid-induced constipation (OIC); NSAID long-term use; DDD (degenerative disc disease), lumbar; Chronic knee pain (Bilateral) (R>L); Osteoarthritis; Chronic musculoskeletal pain; Neurogenic pain; Chronic knee pain s/p total knee replacement (TKR) (Right); CKD (chronic kidney disease) stage 3, GFR 30-59 ml/min ([place]); Chronic shoulder pain (Left); Non-traumatic compression fracture of T3 thoracic vertebra, sequela; High risk medication use; At high risk for falls; Chronic sacroiliac joint pain (Right); Spondylosis without myelopathy or radiculopathy, lumbosacral region; Other specified dorsopathies, sacral and sacrococcygeal region; Abnormal x-ray of pelvis; SBO (small bowel obstruction) ([place]); History of small bowel obstruction; Delayed union of closed fracture of hip, right; Primary osteoarthritis of hip; Ileus ([place]); It band syndrome, right; History of hip replacement (Right); Closed hip fracture, sequela (Right); Acute right hip pain; Chronic lower extremity pain (Right); Abnormal MRI, lumbar spine; Lumbar facet hypertrophy (Multilevel) (Bilateral); 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 Her primarily concern today is the No chief complaint on file.  Pain Assessment: Location:     Radiating:   Onset:   Duration:   Quality:   Severity:  /10 (subjective, self-reported pain score)  Note: Reported level is compatible with observation.                         When using our objective Pain Scale, levels between 6 and 10/10 are said to belong in an  emergency room, as it progressively worsens from a 6/10, described as severely limiting, requiring emergency care not usually available at an outpatient pain management facility. At a 6/10 level, communication becomes difficult and requires great effort. Assistance to  reach the emergency department may be required. Facial flushing and profuse sweating along with potentially dangerous increases in heart rate and blood pressure will be evident. Effect on ADL:   Timing:   Modifying factors:   BP:    HR:    [name] comes in today for post-procedure evaluation.  Previously, the patient indicated that Dr. Percell Miller had suggested that she should have a lumbar epidural steroid injections to see if her right lower extremity pain was secondary to a spinal stenosis that he suspected from a recent MRIs done in his practice.  [name] indicated that she would prefer to have those injections done.  A diagnostic right-sided L4 transforaminal LESI combined with a right-sided L3-4 interlaminar LESI was performed on 04/13/2018 under fluoroscopic guidance and IV sedation.  The following are the results of the diagnostic injections.  Further details on both, my assessment(s), as well as the proposed treatment plan, please see below.  Post-Procedure Assessment  04/13/2018 Procedure: Diagnostic right-sided L4 transforaminal LESI #1 + right-sided L3-4 interlaminar LESI #1 under fluoroscopic guidance and IV sedation Pre-procedure pain score:  6/10 Post-procedure pain score: 8/10 No relief Influential Factors: BMI:   Intra-procedural challenges: None observed.         Assessment challenges: None detected.              Reported side-effects: None.        Post-procedural adverse reactions or complications: None reported         Sedation: Sedation provided. When no sedatives are used, the analgesic levels obtained are directly associated to the effectiveness of the local anesthetics. However, when sedation is provided,  the level of analgesia obtained during the initial 1 hour following the intervention, is believed to be the result of a combination of factors. These factors may include, but are not limited to: 1. The effectiveness of the local anesthetics used. 2. The effects of the analgesic(s) and/or anxiolytic(s) used. 3. The degree of discomfort experienced by the patient at the time of the procedure. 4. The patients ability and reliability in recalling and recording the events. 5. The presence and influence of possible secondary gains and/or psychosocial factors. Reported result: Relief experienced during the 1st hour after the procedure:   (Ultra-Short Term Relief)            Interpretative annotation: Clinically appropriate result. Analgesia during this period is likely to be Local Anesthetic and/or IV Sedative (Analgesic/Anxiolytic) related.          Effects of local anesthetic: The analgesic effects attained during this period are directly associated to the localized infiltration of local anesthetics and therefore cary significant diagnostic value as to the etiological location, or anatomical origin, of the pain. Expected duration of relief is directly dependent on the pharmacodynamics of the local anesthetic used. Long-acting (4-6 hours) anesthetics used.  Reported result: Relief during the next 4 to 6 hour after the procedure:   (Short-Term Relief)            Interpretative annotation: Clinically appropriate result. Analgesia during this period is likely to be Local Anesthetic-related.          Long-term benefit: Defined as the period of time past the expected duration of local anesthetics (1 hour for short-acting and 4-6 hours for long-acting). With the possible exception of prolonged sympathetic blockade from the local anesthetics, benefits during this period are typically attributed to, or associated with, other factors such as analgesic sensory neuropraxia, antiinflammatory effects, or beneficial  biochemical  changes provided by agents other than the local anesthetics.  Reported result: Extended relief following procedure:   (Long-Term Relief)            Interpretative annotation: Clinically possible results. Good relief. No permanent benefit expected. Inflammation plays a part in the etiology to the pain.          Current benefits: Defined as reported results that persistent at this point in time.   Analgesia: *** %            Function: Somewhat improved ROM: Somewhat improved Interpretative annotation: Recurrence of symptoms. No permanent benefit expected. Effective diagnostic intervention.          Interpretation: Results would suggest a successful diagnostic intervention.                  Plan:  Please see "Plan of Care" for details.                Laboratory Chemistry  Inflammation Markers (CRP: Acute Phase) (ESR: Chronic Phase) Lab Results  Component Value Date   CRP <0.8 11/19/2016   ESRSEDRATE 26 (H) 02/09/2018   LATICACIDVEN 1.72 10/26/2017                         Rheumatology Markers No results found.  Renal Markers Lab Results  Component Value Date   BUN 9 05/01/2018   CREATININE 0.93 05/01/2018   BCR 6 (L) 12/04/2016   GFRAA >60 05/01/2018   GFRNONAA 60 (L) 05/01/2018                             Hepatic Markers Lab Results  Component Value Date   AST 27 04/30/2018   ALT 25 04/30/2018   ALBUMIN 2.2 (L) 04/30/2018                        Neuropathy Markers Lab Results  Component Value Date   VITAMINB12 1,888 (H) 11/19/2016   HGBA1C 4.9 09/19/2017   HIV Non Reactive 04/21/2018                        Hematology Parameters Lab Results  Component Value Date   INR 1.00 09/13/2016   LABPROT 13.2 09/13/2016   APTT 34 01/04/2011   PLT 349 05/03/2018   HGB 11.6 (L) 05/03/2018   HCT 38.1 05/03/2018   DDIMER 1.71 (H) 10/23/2016                        CV Markers Lab Results  Component Value Date   BNP 60.2 01/23/2016   TROPONINI <0.03  04/30/2018                         Note: Lab results reviewed.  Recent Imaging Results                      Results for orders placed in visit on 04/07/18  DG C-Arm 1-60 Min-No Report   Narrative Fluoroscopy was utilized by the requesting physician.  No radiographic  interpretation.    Interpretation Report: Fluoroscopy was used during the procedure to assist with needle guidance. The images were interpreted intraoperatively by the requesting physician.  Imaging Review  Cervical Imaging: Cervical MR wo contrast:  Results for orders placed during the hospital encounter  of 10/07/14  MR Cervical Spine Wo Contrast   Narrative CLINICAL DATA:  Known cervical spine fracture, new onset fever. Here for assessment of sepsis.  EXAM: MRI CERVICAL AND LUMBAR SPINE WITHOUT CONTRAST  TECHNIQUE: Multiplanar and multiecho pulse sequences of the cervical spine, to include the craniocervical junction and cervicothoracic junction, and thoracic spine, were obtained without intravenous contrast. Please note, motion degraded examination. Per technologist note, patient did not wish to receive intravenous contrast due to pain, examination is noncontrast.  COMPARISON:  CT of the cervical spine Oct 05, 2014  FINDINGS: MRI CERVICAL SPINE FINDINGS  Motion degraded examination.  Moderate C7 compression fracture, low T1 and bright T2 signal with 30-50% height loss. No retropulsed bony fragments. Remaining cervical vertebral bodies appear intact. Maintenance of cervical lordosis. Intervertebral discs demonstrate normal morphology, decreased T2 signal within all cervical disc most consistent with desiccation.  Low signal measuring up to 6 mm in AP dimension posterior to the odontoid process consistent with pannus, can be seen with CPPD without erosions or edema. Cervical spinal cord appears normal morphology and signal characteristics though, motion degrades sensitivity for subtle cord  signal abnormality. No syrinx. Included prevertebral and paraspinal soft tissues are normal.  Level by level evaluation:  C2-3: Moderate facet arthropathy. No disc bulge, canal stenosis or neural foraminal narrowing.  C3-4: Annular bulging, uncovertebral hypertrophy. Moderate LEFT greater than RIGHT facet arthropathy. Very mild canal stenosis. Mild LEFT neural foraminal narrowing.  C4-5: Small broad-based disc bulge, mild to moderate facet arthropathy and uncovertebral hypertrophy. Moderate canal stenosis. Moderate to severe LEFT greater than RIGHT neural foraminal narrowing.  C5-6: Small broad-based disc bulge, uncovertebral hypertrophy and moderate facet arthropathy. Mild canal stenosis. Moderate RIGHT, moderate to severe LEFT neural foraminal narrowing.  C6-7: Severely limited by motion and habitus. No definite disc bulge. At least moderate facet arthropathy without canal stenosis or neural foraminal narrowing.  C7-T1: No definite disc bulge, canal stenosis. At least mild facet arthropathy without neural foraminal narrowing.  MRI THORACIC SPINE FINDINGS  Moderate acute appearing T2 compression fracture with 30-50% height loss. Mild acute appearing T3 compression fracture. The remaining thoracic vertebral bodies appear intact. Maintenance of thoracic kyphosis. Intervertebral discs demonstrate relatively preserved morphology and signal characteristics. Minimal subacute to chronic discogenic endplate change of lower thoracic spine. 13 mm T6 hemangioma. Scattered chronic Schmorl's nodes.  Thoracic spinal cord appears normal in morphology and signal characteristics of the level the conus medullaris which terminates at T12-L1. Motion decreases sensitivity for potential cord signal abnormality.  No significant disc bulge, canal stenosis or neural foraminal narrowing any thoracic level.  IMPRESSION: Please note, examination was ordered with contrast though,  after noncontrast portion, patient was unable to tolerate imaging due to pain and contrast-enhanced sequences not performed.  MRI CERVICAL SPINE: Acute moderate C7 compression fracture. No malalignment.  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 Multilevel neural foraminal narrowing: Moderate to severe on the LEFT at C4-5 and C5-6.  MRI THORACIC SPINE: Acute moderate T2 compression fracture. Mild acute T3 compression fracture. No malalignment.   Electronically Signed   By: Elon Alas   On: 10/08/2014 04:30    Cervical CT wo contrast:  Results for orders placed during the hospital encounter of 06/25/17  CT Cervical Spine Wo Contrast   Narrative CLINICAL DATA:  Fall at home [place] being on right side. Head injury. C-spine trauma with high clinical risk.  EXAM: CT HEAD WITHOUT CONTRAST  CT CERVICAL SPINE WITHOUT CONTRAST  TECHNIQUE: Multidetector CT imaging of the head and cervical spine was performed following the standard protocol without intravenous contrast. Multiplanar CT image reconstructions of the cervical spine were also generated.  COMPARISON:  09/13/2016  FINDINGS: CT HEAD FINDINGS  Brain: No evidence of acute infarction, hemorrhage, hydrocephalus, extra-axial collection or mass lesion/mass effect. Mild chronic small vessel ischemia in the periventricular white matter. Mild for age cerebral volume loss.  Vascular: Atherosclerotic calcification.  No hyperdense vessel.  Skull: Negative for fracture  Sinuses/Orbits: No evidence of injury. Right cataract resection and bilateral staphyloma.  CT CERVICAL SPINE FINDINGS  Alignment: No traumatic malalignment.  Skull base and vertebrae: Remote compression fractures of C7 and T2. No acute fracture.  Soft tissues and spinal canal: No prevertebral fluid or swelling. No visible canal hematoma.  Disc levels:  Usual degenerative changes.  Upper chest: No  evidence of injury.  IMPRESSION: No evidence of acute intracranial or cervical spine injury.   Electronically Signed   By: Monte Fantasia M.D.   On: 06/25/2017 10:19    Cervical DG 2-3 views:  Results for orders placed during the hospital encounter of 09/28/14  DG Cervical Spine 2 or 3 views   Narrative CLINICAL DATA:  Posterior neck pain, status post fall 4 days ago  EXAM: CERVICAL SPINE - 2-3 VIEW  COMPARISON:  Cervical spine CT scan of September 22, 2014  FINDINGS: The cervical vertebral bodies are preserved in height. The disc space heights are well maintained. There is mild multi level facet joint hypertrophy. There is degenerative change of the atlanto-dens articulation. The prevertebral soft tissue spaces are normal.  IMPRESSION: There is no acute bony abnormality of the cervical spine.   Electronically Signed   By: David  Martinique M.D.   On: 09/28/2014 15:49    Shoulder Imaging: Shoulder-L DG:  Results for orders placed during the hospital encounter of 03/26/16  DG Shoulder Left   Narrative CLINICAL DATA:  Syncopal episode, falling onto the left shoulder was subsequent pain.  EXAM: LEFT SHOULDER - 2+ VIEW  COMPARISON:  04/24/2015  FINDINGS: Humeral head is properly located. No evidence of regional fracture. No degenerative change. Normal humeral acromial distance.  IMPRESSION: Negative radiographs.   Electronically Signed   By: Nelson Chimes M.D.   On: 03/26/2016 15:26    Thoracic Imaging: Thoracic MR wo contrast:  Results for orders placed during the hospital encounter of 03/18/17  MR THORACIC SPINE WO CONTRAST   Narrative CLINICAL DATA:  Initial evaluation for low back pain radiating into right hip and lower extremity. Recent right hip fracture.  EXAM: MRI THORACIC AND LUMBAR SPINE WITHOUT CONTRAST  TECHNIQUE: Multiplanar and multiecho pulse sequences of the thoracic and lumbar spine were obtained without intravenous  contrast.  COMPARISON:  None available.  FINDINGS: MRI THORACIC SPINE FINDINGS  Alignment: Mild exaggeration of the normal thoracic kyphosis. No listhesis or subluxation.  Vertebrae: Mild chronic height loss at the T1 through T4 vertebral bodies. Vertebral body heights otherwise maintained. No evidence for acute for subacute fracture. Bone marrow signal intensity within normal limits. Prominent 14 mm hemangioma noted within the T6 vertebral body. No other discrete or worrisome osseous lesions.  Cord:  Signal intensity within the thoracic spinal cord is normal.  Paraspinal and other soft tissues: Paraspinous soft tissues within normal limits. Partially visualized lungs are grossly clear. Scattered T2 hyperintense cyst noted within the kidneys bilaterally.  Disc levels:  T9-10: Tiny left paracentral disc protrusion indents the ventral thecal sac (series 15, image 26).  No stenosis.  T11-12: Shallow central disc protrusion mildly flattens the ventral thecal sac (series 15, image 33). No stenosis.  No other significant degenerative changes within the thoracic spine. No canal or foraminal stenosis.  MRI LUMBAR SPINE FINDINGS  Segmentation: When counting from the dens, there appear to be only 11 pairs of ribs. For the purposes of this dictation, the lowest well-formed disc space will be labeled the L5-S1 level, and the highest non rib-bearing vertebral body will be labeled L1.  Alignment: Mild rotoscoliosis. Vertebral bodies otherwise normally aligned with preservation of the normal lumbar lordosis. No listhesis.  Vertebrae: Vertebral body heights are maintained. No evidence for acute or chronic fracture. The bone marrow signal intensity within normal limits. No discrete or worrisome osseous lesions. No abnormal marrow edema.  Conus medullaris: Extends to the L1-2 level and appears normal.  Paraspinal and other soft tissues: Paraspinous soft tissues demonstrate no acute  abnormality. Scattered T2 hyperintense cyst noted within the kidneys, greater on the left. Visualized visceral structures otherwise unremarkable.  Disc levels:  L1-2:  Unremarkable.  L2-3:  Unremarkable.  L3-4: Diffuse degenerative disc bulge with disc desiccation. Annular fissure noted at the level of the right neural foramen. Moderate facet and ligamentum flavum hypertrophy. Reactive effusions present within the bilateral L3-4 facets. Mild canal and bilateral foraminal stenosis without neural impingement.  L4-5: Mild disc bulge with disc desiccation. Small posterior annular fissure noted. Mild facet and ligamentum flavum hypertrophy. No significance canal stenosis. Mild bilateral L4 foraminal narrowing without impingement.  L5-S1: Normal disc. Mild facet hypertrophy. No significant stenosis.  IMPRESSION: MR THORACIC SPINE IMPRESSION  1. Mild degenerative disc bulging at T9-10 and T11-12 without significant stenosis. 2. Otherwise unremarkable MRI of the thoracic spine. No other significant disc pathology. No canal or neural foraminal stenosis.  MR LUMBAR SPINE IMPRESSION  1. Degenerative disc bulge with facet hypertrophy at L3-4 with resultant mild canal and bilateral foraminal stenosis. 2. Degenerative disc bulge at L4-5 with resultant mild bilateral L4 foraminal narrowing. 3. Otherwise negative MRI of the lumbar spine. No other significant stenosis or evidence for neural impingement.   Electronically Signed   By: Jeannine Boga M.D.   On: 03/18/2017 15:33    Thoracic CT wo contrast:  Results for orders placed during the hospital encounter of 02/15/15  CT Thoracic Spine Wo Contrast   Narrative CLINICAL DATA:  [place] out of bed onto hardwood floors, head neck pain, recent upper thoracic fracture, new fall  EXAM: CT THORACIC AND LUMBAR SPINE WITHOUT CONTRAST  TECHNIQUE: Multidetector CT imaging of the thoracic and lumbar spine was performed without  contrast. Multiplanar CT image reconstructions were also generated.  COMPARISON:  CT thoracic spine 01/26/2015, lumbar spine radiographs 09/22/2014, CT abdomen and pelvis 10/17/2012  FINDINGS: CT THORACIC SPINE FINDINGS  Diffuse osseous demineralization.  Scattered atherosclerotic calcifications with ascending thoracic aorta 3.9 cm transverse image 42.  Additional atherosclerotic calcifications at coronary arteries and proximal great vessels.  No thoracic adenopathy.  Calcified granuloma LEFT upper lobe image 19.  Anterior compression fracture of T2 unchanged, involving both superior and inferior endplates.  Superior endplate compression fracture T3 unchanged.  Minimal concavity superior endplate T4 stable.  No new fracture, subluxation or bone destruction.  Visualized portions of ribs and scapulae unremarkable.  CT LUMBAR SPINE FINDINGS  Small calcified renal artery aneurysms bilaterally 10 mm RIGHT hand 8 mm LEFT.  Atherosclerotic calcifications aorta.  LEFT adrenal nodule 14 x 10 mm stable since 10/17/2012.  Based on numbering of ribs  and thoracic spine, only 4 lumbar type vertebral bodies are identified.  No lumbar fracture or subluxation.  Mild scattered degenerative disc disease changes.  Inferior endplate concavities at L2, L3 and L4 are stable from prior radiographs.  SI joints symmetric.  Diffusely bulging discs at L1-L2, L2-L3, and L3-L4.  Significant multifactorial spinal stenosis at L2-L3.  IMPRESSION: CT THORACIC SPINE IMPRESSION  Stable compression fractures at T2 and T3.  Stable superior endplate concavity T4.  Osseous demineralization with scattered degenerative disc disease changes.  No acute abnormalities.  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  Recommend annual imaging followup by CTA or MRA. This recommendation follows 2010 ACCF/AHA/AATS/ACR/ASA/SCA/SCAI/SIR/STS/SVM Guidelines for the  Diagnosis and Management of Patients with Thoracic Aortic Disease. Circulation.2010; 121: I097-D532  CT LUMBAR SPINE IMPRESSION  No acute lumbar spine abnormalities.  Stable inferior endplate concavities at L2, L3, and L4.  Bulging discs at multiple levels with significant multifactorial spinal stenosis likely present at L2-L3.  Small BILATERAL calcified renal artery aneurysms.   Electronically Signed   By: Lavonia Dana M.D.   On: 02/15/2015 20:46    Thoracic DG 2-3 views:  Results for orders placed during the hospital encounter of 05/22/15  DG Thoracic Spine 2 View   Narrative CLINICAL DATA:  77 year old female with acute on chronic back pain after falling 5 days previously. Pain localizes to the thoracic spine.  EXAM: THORACIC SPINE 2 VIEWS  COMPARISON:  Prior CT scan of the thoracic spine 02/15/2015  FINDINGS: There is no evidence of thoracic spine fracture. Alignment is normal. No other significant bone abnormalities are identified. Atherosclerotic vascular calcifications present in both renal arteries proximally.  IMPRESSION: Negative.   Electronically Signed   By: Jacqulynn Cadet M.D.   On: 05/22/2015 16:41    Thoracic DG w/swimmers view:  Results for orders placed during the hospital encounter of 03/29/16  DG Thoracic Spine W/Swimmers   Narrative CLINICAL DATA:  [place] 3 days ago, mid back pain, history osteoporosis, osteoarthritis  EXAM: THORACIC SPINE - 3 VIEWS  COMPARISON:  Chest radiograph 01/23/2016  FINDINGS: Osseous demineralization.  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  Minimal biconvex scoliosis.  Mild chronic superior endplate height loss of T2 vertebral body.  Minimal superior endplate concavities at T3 and T4, also chronic.  No acute fracture, subluxation, or bone destruction.  IMPRESSION: Osseous mineralization.  Chronic superior endplate changes at D9-M4 as above.  No acute abnormalities.   Electronically Signed    By: Lavonia Dana M.D.   On: 03/29/2016 12:16    Lumbosacral Imaging: Lumbar MR wo contrast:  Results for orders placed during the hospital encounter of 03/18/17  MR LUMBAR SPINE WO CONTRAST   Narrative CLINICAL DATA:  Initial evaluation for low back pain radiating into right hip and lower extremity. Recent right hip fracture.  EXAM: MRI THORACIC AND LUMBAR SPINE WITHOUT CONTRAST  TECHNIQUE: Multiplanar and multiecho pulse sequences of the thoracic and lumbar spine were obtained without intravenous contrast.  COMPARISON:  None available.  FINDINGS: MRI THORACIC SPINE FINDINGS  Alignment: Mild exaggeration of the normal thoracic kyphosis. No listhesis or subluxation.  Vertebrae: Mild chronic height loss at the T1 through T4 vertebral bodies. Vertebral body heights otherwise maintained. No evidence for acute for subacute fracture. Bone marrow signal intensity within normal limits. Prominent 14 mm hemangioma noted within the T6 vertebral body. No other discrete or worrisome osseous lesions.  Cord:  Signal intensity within the thoracic spinal cord is normal.  Paraspinal and other  soft tissues: Paraspinous soft tissues within normal limits. Partially visualized lungs are grossly clear. Scattered T2 hyperintense cyst noted within the kidneys bilaterally.  Disc levels:  T9-10: Tiny left paracentral disc protrusion indents the ventral thecal sac (series 15, image 26). No stenosis.  T11-12: Shallow central disc protrusion mildly flattens the ventral thecal sac (series 15, image 33). No stenosis.  No other significant degenerative changes within the thoracic spine. No canal or foraminal stenosis.  MRI LUMBAR SPINE FINDINGS  Segmentation: When counting from the dens, there appear to be only 11 pairs of ribs. For the purposes of this dictation, the lowest well-formed disc space will be labeled the L5-S1 level, and the highest non rib-bearing vertebral body will be labeled  L1.  Alignment: Mild rotoscoliosis. Vertebral bodies otherwise normally aligned with preservation of the normal lumbar lordosis. No listhesis.  Vertebrae: Vertebral body heights are maintained. No evidence for acute or chronic fracture. The bone marrow signal intensity within normal limits. No discrete or worrisome osseous lesions. No abnormal marrow edema.  Conus medullaris: Extends to the L1-2 level and appears normal.  Paraspinal and other soft tissues: Paraspinous soft tissues demonstrate no acute abnormality. Scattered T2 hyperintense cyst noted within the kidneys, greater on the left. Visualized visceral structures otherwise unremarkable.  Disc levels:  L1-2:  Unremarkable.  L2-3:  Unremarkable.  L3-4: Diffuse degenerative disc bulge with disc desiccation. Annular fissure noted at the level of the right neural foramen. Moderate facet and ligamentum flavum hypertrophy. Reactive effusions present within the bilateral L3-4 facets. Mild canal and bilateral foraminal stenosis without neural impingement.  L4-5: Mild disc bulge with disc desiccation. Small posterior annular fissure noted. Mild facet and ligamentum flavum hypertrophy. No significance canal stenosis. Mild bilateral L4 foraminal narrowing without impingement.  L5-S1: Normal disc. Mild facet hypertrophy. No significant stenosis.  IMPRESSION: MR THORACIC SPINE IMPRESSION  1. Mild degenerative disc bulging at T9-10 and T11-12 without significant stenosis. 2. Otherwise unremarkable MRI of the thoracic spine. No other significant disc pathology. No canal or neural foraminal stenosis.  MR LUMBAR SPINE IMPRESSION  1. Degenerative disc bulge with facet hypertrophy at L3-4 with resultant mild canal and bilateral foraminal stenosis. 2. Degenerative disc bulge at L4-5 with resultant mild bilateral L4 foraminal narrowing. 3. Otherwise negative MRI of the lumbar spine. No other significant stenosis or evidence for  neural impingement.   Electronically Signed   By: Jeannine Boga M.D.   On: 03/18/2017 15:33    Lumbar CT wo contrast:  Results for orders placed during the hospital encounter of 02/15/15  CT Lumbar Spine Wo Contrast   Narrative CLINICAL DATA:  [place] out of bed onto hardwood floors, head neck pain, recent upper thoracic fracture, new fall  EXAM: CT THORACIC AND LUMBAR SPINE WITHOUT CONTRAST  TECHNIQUE: Multidetector CT imaging of the thoracic and lumbar spine was performed without contrast. Multiplanar CT image reconstructions were also generated.  COMPARISON:  CT thoracic spine 01/26/2015, lumbar spine radiographs 09/22/2014, CT abdomen and pelvis 10/17/2012  FINDINGS: CT THORACIC SPINE FINDINGS  Diffuse osseous demineralization.  Scattered atherosclerotic calcifications with ascending thoracic aorta 3.9 cm transverse image 42.  Additional atherosclerotic calcifications at coronary arteries and proximal great vessels.  No thoracic adenopathy.  Calcified granuloma LEFT upper lobe image 19.  Anterior compression fracture of T2 unchanged, involving both superior and inferior endplates.  Superior endplate compression fracture T3 unchanged.  Minimal concavity superior endplate T4 stable.  No new fracture, subluxation or bone destruction.  Visualized portions of ribs and  scapulae unremarkable.  CT LUMBAR SPINE FINDINGS  Small calcified renal artery aneurysms bilaterally 10 mm RIGHT hand 8 mm LEFT.  Atherosclerotic calcifications aorta.  LEFT adrenal nodule 14 x 10 mm stable since 10/17/2012.  Based on numbering of ribs and thoracic spine, only 4 lumbar type vertebral bodies are identified.  No lumbar fracture or subluxation.  Mild scattered degenerative disc disease changes.  Inferior endplate concavities at L2, L3 and L4 are stable from prior radiographs.  SI joints symmetric.  Diffusely bulging discs at L1-L2, L2-L3, and L3-L4.  Significant  multifactorial spinal stenosis at L2-L3.  IMPRESSION: CT THORACIC SPINE IMPRESSION  Stable compression fractures at T2 and T3.  Stable superior endplate concavity T4.  Osseous demineralization with scattered degenerative disc disease changes.  No acute abnormalities.  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  Recommend annual imaging followup by CTA or MRA. This recommendation follows 2010 ACCF/AHA/AATS/ACR/ASA/SCA/SCAI/SIR/STS/SVM Guidelines for the Diagnosis and Management of Patients with Thoracic Aortic Disease. Circulation.2010; 121: T062-I948  CT LUMBAR SPINE IMPRESSION  No acute lumbar spine abnormalities.  Stable inferior endplate concavities at L2, L3, and L4.  Bulging discs at multiple levels with significant multifactorial spinal stenosis likely present at L2-L3.  Small BILATERAL calcified renal artery aneurysms.   Electronically Signed   By: Lavonia Dana M.D.   On: 02/15/2015 20:46    Lumbar DG 2-3 views:  Results for orders placed during the hospital encounter of 06/25/17  DG Lumbar Spine 2-3 Views   Narrative CLINICAL DATA:  77 year old female with a history of fall  EXAM: LUMBAR SPINE - 2-3 VIEW  COMPARISON:  CT 10/23/2016  FINDINGS: Lumbar Spine:  Lumbar vertebral elements maintain normal alignment without evidence of anterolisthesis, retrolisthesis, subluxation.  No fracture line identified. Vertebral body heights maintained.  No significant disc space narrowing. Mild endplate changes throughout the lumbar spine.  Osteopenia.  Vascular calcifications  IMPRESSION: No radiographic evidence of acute fracture or malalignment of the lumbar spine.  Osteopenia.  Mild degenerative changes.   Electronically Signed   By: Corrie Mckusick D.O.   On: 06/25/2017 10:32    Lumbar DG (Complete) 4+V:  Results for orders placed during the hospital encounter of 03/29/18  DG Lumbar Spine Complete   Narrative  CLINICAL DATA:  Fall with right hip pain.  EXAM: LUMBAR SPINE - COMPLETE 4+ VIEW  COMPARISON:  03/26/2018 lumbar spine MRI  FINDINGS: This report assumes 5 non rib-bearing lumbar vertebrae.  Lumbar vertebral body heights are stable from recent MRI, with no fracture.  Mild multilevel lumbar degenerative disc disease, most prominent at L1-2 and L3-4. No spondylolisthesis. No significant facet arthropathy. No aggressive appearing focal osseous lesions. Abdominal aortic atherosclerosis. Partially visualized right total hip arthroplasty.  IMPRESSION: 1. No acute osseous abnormality. 2. Mild multilevel lumbar degenerative disc disease.   Electronically Signed   By: Ilona Sorrel M.D.   On: 03/29/2018 19:41    Hip Imaging: Hip-R MR wo contrast:  Results for orders placed during the hospital encounter of 01/01/17  MR HIP RIGHT WO CONTRAST   Narrative CLINICAL DATA:  Patient fell on a prolonged broke right hip. Right hip pain and groin pain with limited range of motion.  EXAM: MR OF THE RIGHT HIP WITHOUT CONTRAST  TECHNIQUE: Multiplanar, multisequence MR imaging was performed. No intravenous contrast was administered.  COMPARISON:  CT from 12/05/2016  FINDINGS: Bones: Susceptibility artifact from right femoral nail fixation across an intratrochanteric fracture of the right femur is identified. Detail of the  fracture, better visualized on the recent CT is compromised as result. There appears to be marrow edema of the weight-bearing portion of the femoral head. Curvilinear hypointensity of the subcortical right femoral head raises the possibility of avascular necrosis, series 2, image 20. Sub chondral degenerative cyst of the acetabular roof is seen on the right.  Articular cartilage and labrum  Articular cartilage: Given joint space narrowing of both hips, and there is likely moderate thinning 10 of the acetabular and femoral head cartilage bilaterally.  Labrum: No  definite labral tear though assessment is limited by lack of joint fluid.  Joint or bursal effusion  Joint effusion:  No joint effusion  Bursae:  No bursal fluid collections  Muscles and tendons  Muscles and tendons: Mild edema of the right gluteus muscles possibly representing stigmata of mild strain or tendinopathy.  Other findings  Miscellaneous: The included SI joints are nonacute. The pubic symphysis is maintained. No sacral insufficiency fracture is identified. The patient is status post hysterectomy. No adnexal mass is noted.  IMPRESSION: 1. Limited study due to pre-existing right femoral nail fixation causing susceptibility artifacts through a known intertrochanteric fracture of the right femur. 2. Despite susceptibility artifacts, there does appear to be some evidence of marrow edema along the weight-bearing portion of the right femoral head. A curvilinear hypointensity seen along involving the subcortical bone and the possibility of AVN is not excluded. 3. Marrow edema adjacent to the greater trochanter likely representing mild gluteal muscle strain.   Electronically Signed   By: Ashley Royalty M.D.   On: 01/01/2017 23:11    Hip-R CT wo contrast:  Results for orders placed during the hospital encounter of 02/17/18  CT Hip Right Wo Contrast   Narrative CLINICAL DATA:  77 year old female with worsening right hip pain since hip replacement.  EXAM: CT OF THE RIGHT HIP WITHOUT CONTRAST  TECHNIQUE: Multidetector CT imaging of the right hip was performed according to the standard protocol. Multiplanar CT image reconstructions were also generated.  COMPARISON:  Right hip radiograph dated 02/09/2018 and CT of the abdomen pelvis dated 10/26/2017.  FINDINGS: Evaluation of this exam is limited in the absence of intravenous contrast. Evaluation is also limited due to streak artifact caused by metallic arthroplasty.  Bones/Joint/Cartilage  There is a total right  hip arthroplasty which appears intact and in anatomic alignment. There is satisfactory contact between the arthroplasty and bone. There is no acute fracture or dislocation. There is soft tissue thickening around the neck of the arthroplasty which may represent effusion. Evaluation of this area is limited due to streak artifact caused by arthroplasty. Ultrasound may provide better evaluation.  Ligaments  Suboptimally assessed by CT.  Muscles and Tendons  No acute findings.  No intramuscular hematoma.  Soft tissues  Right hip subcutaneous scarring, postsurgical.  No fluid collection.  IMPRESSION: 1. No acute fracture or dislocation. 2. Right hip arthroplasty appears intact with no evidence of loosening. There may fluid within the joint adjacent to the neck of the arthroplasty. Ultrasound may provide better evaluation.   Electronically Signed   By: Anner Crete M.D.   On: 02/17/2018 06:11    Hip-R DG 2-3 views:  Results for orders placed during the hospital encounter of 04/17/18  DG Hip Unilat  With Pelvis 2-3 Views Right   Narrative CLINICAL DATA:  Worsening right hip pain following right hip surgery in May 2019.  EXAM: DG HIP (WITH OR WITHOUT PELVIS) 2-3V RIGHT  COMPARISON:  03/29/2018.  FINDINGS: Stable right  total hip prosthesis. No fracture or dislocation seen. Diffuse osteopenia.  IMPRESSION: 1. No acute abnormality. 2. Hardware intact.   Electronically Signed   By: Claudie Revering M.D.   On: 04/17/2018 11:59    Hip-L DG 2-3 views:  Results for orders placed during the hospital encounter of 03/26/16  DG Hip Unilat With Pelvis 2-3 Views Left   Narrative CLINICAL DATA:  Syncopal episode falling onto the left side with left-sided pain.  EXAM: DG HIP (WITH OR WITHOUT PELVIS) 2-3V LEFT  COMPARISON:  None.  FINDINGS: No pelvic fracture. No hip fracture. No significant degenerative change.  IMPRESSION: Negative radiographs.   Electronically  Signed   By: Nelson Chimes M.D.   On: 03/26/2016 15:27    Knee Imaging: Knee-L MR w contrast:  Results for orders placed during the hospital encounter of 09/04/16  MR KNEE LEFT WO CONTRAST   Narrative CLINICAL DATA:  Feels like the knee is popping out.  Pain.  EXAM: MRI OF THE LEFT KNEE WITHOUT CONTRAST  TECHNIQUE: Multiplanar, multisequence MR imaging of the knee was performed. No intravenous contrast was administered.  COMPARISON:  None.  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 No obvious periarticular fluid collection. No osteolysis. No acute fracture or dislocation. Overall alignment is anatomic. No Baker cyst.  Ligaments  Collateral ligaments are obscured by susceptibility artifact.  Muscles and Tendons No muscle signal abnormality. No muscle atrophy or edema. No intramuscular fluid collection or hematoma. Patellar tendon and quadriceps tendon are intact.  Soft tissue No fluid collection or hematoma. No soft tissue mass. Popliteal fossa is normal. Normal neurovascular bundles.  IMPRESSION: 1. 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 No evidence of hardware failure or complication. 2. No Baker cyst.   Electronically Signed   By: Kathreen Devoid   On: 09/04/2016 13:51    Knee-R MR wo contrast:  Results for orders placed during the hospital encounter of 01/01/17  MR KNEE RIGHT WO CONTRAST   Narrative CLINICAL DATA:  Pain after fall in April. Right knee pain and swelling.  EXAM: MRI OF THE RIGHT KNEE WITHOUT CONTRAST  TECHNIQUE: Multiplanar, multisequence MR imaging of the knee was performed. No intravenous contrast was administered.  COMPARISON:  11/19/2016  FINDINGS: Markedly compromised study due to susceptibility artifacts from an indwelling  long-stem semi constrained knee arthroplasty.  Imaging was performed in an attempt to identify any potentially occult periprosthetic fracture. No such fracture or edema is identified. No significant joint effusion. Generalized muscle atrophy is seen.  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 No occult fracture about the femoral or tibial stem are identified. There is generalized muscle atrophy about the knee. No significant fluid collections are apparent.   Electronically Signed   By: Ashley Royalty M.D.   On: 01/01/2017 23:14    Knee-R DG 1-2 views:  Results for orders placed during the hospital encounter of 11/19/16  DG Knee 1-2 Views Right   Narrative CLINICAL DATA:  Patient reports right knee pain that is worse when attempting to flex knee. Patient has had multiple right knee surgeries. No new or recent injuries.  EXAM: RIGHT KNEE - 1-2 VIEW  COMPARISON:  09/27/2016  FINDINGS: No fracture or bone lesion.  The femoral and tibial prosthetic components are well-seated with no evidence of loosening.  No joint effusion.  Soft tissues are unremarkable.  Bones are demineralized.  IMPRESSION: 1. No fracture, bone lesion or evidence of loosening of the orthopedic hardware.   Electronically Signed   By: Lajean Manes M.D.   On: 11/19/2016 16:47    Knee-R DG 4 views:  Results for orders placed during the hospital encounter of 09/27/16  DG Knee Complete 4 Views Right   Narrative CLINICAL DATA:  77 year old female with a history of right knee pain. Prior fracture  EXAM: RIGHT KNEE - COMPLETE 4+ VIEW  COMPARISON:  09/13/2016  FINDINGS: Surgical changes of right knee arthroplasty. No evidence of perihardware fracture. No joint effusion. No focal soft tissue swelling. No displaced fracture of the visualized femur or proximal lower extremity.  IMPRESSION: No acute bony abnormality.  Surgical changes of prior knee  arthroplasty.   Electronically Signed   By: Corrie Mckusick D.O.   On: 09/27/2016 13:32    Knee-L DG 4 views:  Results for orders placed during the hospital encounter of 11/14/07  DG Knee Complete 4 Views Left   Narrative Clinical Data: 77 year old female status post knee dislocation. Pain.   LEFT KNEE - COMPLETE 4+ VIEW   Comparison: 04/29/2007.   Findings: The patient is status post left total knee arthroplasty. The knee is located.  A moderate sized joint effusion is seen. There is no evidence for acute fracture.   IMPRESSION:   1.  Moderate sized joint effusion without underlying fracture dislocation. 2.  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  Provider: Rayna Sexton   Ankle Imaging: Ankle-L DG Complete:  Results for orders placed during the hospital encounter of 03/09/10  DG Ankle Complete Left   Narrative Clinical Data: [place] down stairs - pain and ankle   LEFT ANKLE COMPLETE - 3+ VIEW   Comparison: None.   Findings: There is a medial soft tissue swelling.  No fracture or dislocation.  Bones mildly demineralized.   IMPRESSION: Normal except for medial soft tissue swelling.  Provider: Elvera Lennox   Foot Imaging: Foot-L DG Complete:  Results for orders placed during the hospital encounter of 03/09/10  DG Foot Complete Left   Narrative Clinical Data: [place] down stairs - laceration in the region of the fifth metatarsal   LEFT FOOT - COMPLETE 3+ VIEW   Comparison: None.   Findings: No fracture or dislocation.  There is a skin defect over the base of the fifth metatarsal.  No foreign body in the soft tissues.  Small calcaneal spur.  Bones somewhat demineralized.   IMPRESSION: No acute findings.  There is a laceration near the base of the fifth metatarsal.  Provider: Elvera Lennox   Elbow Imaging: Elbow-L DG Complete:  Results for orders placed during the hospital encounter of 03/09/10  DG Elbow Complete Left   Narrative Clinical  Data: [place] - posterior elbow pain   LEFT ELBOW - COMPLETE 3+ VIEW   Comparison: None.   Findings: The bones are mildly demineralized. No fracture or dislocation.  No specific arthropathy. No foreign body or other abnormality of the soft tissues.   IMPRESSION: No acute or significant findings.  Provider: Elvera Lennox   Wrist Imaging: Wrist-L DG Complete:  Results for orders placed during the hospital encounter of 03/12/15  DG Wrist Complete Left   Narrative CLINICAL DATA:  77 year old female with history of trauma from a fall yesterday evening complaining of pain in the left wrist.  EXAM: LEFT WRIST - COMPLETE 3+ VIEW  COMPARISON:  None.  FINDINGS: Multiple views of the left wrist demonstrate no acute displaced fracture,  subluxation, dislocation, or soft tissue abnormality.  IMPRESSION: No acute radiographic abnormality of the left wrist.   Electronically Signed   By: Vinnie Langton M.D.   On: 03/12/2015 11:30    Complexity Note: Imaging results reviewed. Results shared with [name], using Layman's terms.                          Meds   Current Outpatient Medications:  .  albuterol (ACCUNEB) 1.25 MG/3ML nebulizer solution, Take 1 ampule by nebulization every 6 (six) hours as needed for wheezing., Disp: , Rfl:  .  albuterol (PROVENTIL HFA;VENTOLIN HFA) 108 (90 BASE) MCG/ACT inhaler, Inhale 2 puffs into the lungs every 6 (six) hours as needed for wheezing or shortness of breath. , Disp: , Rfl:  .  alendronate (FOSAMAX) 70 MG tablet, Take 70 mg by mouth every Monday. Take with a full glass of water on an empty stomach., Disp: , Rfl:  .  ALPRAZolam (XANAX) 1 MG tablet, Take 1 mg by mouth at bedtime as needed for sleep., Disp: , Rfl:  .  amLODipine (NORVASC) 5 MG tablet, Take 5 mg by mouth daily. , Disp: , Rfl:  .  apixaban (ELIQUIS) 5 MG TABS tablet, Take 2 tablets (10 mg total) by mouth 2 (two) times daily for 4 days., Disp: 16 tablet, Rfl: 0 .  [START ON  05/09/2018] apixaban (ELIQUIS) 5 MG TABS tablet, Take 1 tablet (5 mg total) by mouth 2 (two) times daily. Start after 57m twice day dosing, Disp: 60 tablet, Rfl: 0 .  benzonatate (TESSALON) 200 MG capsule, Take 1 capsule (200 mg total) by mouth 2 (two) times daily as needed for cough., Disp: 20 capsule, Rfl: 0 .  Calcium 500-100 MG-UNIT CHEW, Chew 1 tablet by mouth daily., Disp: , Rfl:  .  Calcium-Phosphorus-Vitamin D (CALCIUM/D3 ADULT GUMMIES PO), Take 1 tablet by mouth daily., Disp: , Rfl:  .  Cholecalciferol (VITAMIN D HIGH POTENCY PO), Take 5,000 Units by mouth daily., Disp: , Rfl:  .  citalopram (CELEXA) 20 MG tablet, Take 20 mg by mouth daily. , Disp: , Rfl:  .  docusate sodium (COLACE) 100 MG capsule, Take 200 mg by mouth 2 (two) times daily as needed for moderate constipation. , Disp: , Rfl:  .  esomeprazole (NEXIUM) 40 MG capsule, Take 40 mg by mouth daily. , Disp: , Rfl:  .  Ferrous Sulfate (IRON) 325 (65 Fe) MG TABS, Take 1 tablet (325 mg total) by mouth 2 (two) times daily. (Patient taking differently: Take 1 tablet by mouth 2 (two) times a week. ), Disp: 60 each, Rfl: 0 .  fluticasone (FLOVENT HFA) 220 MCG/ACT inhaler, Inhale 1 puff into the lungs 2 (two) times daily., Disp: , Rfl:  .  guaiFENesin (MUCINEX) 600 MG 12 hr tablet, Take 600 mg by mouth 2 (two) times daily as needed for cough., Disp: , Rfl:  .  Krill Oil 500 MG CAPS, Take 1 capsule by mouth daily., Disp: , Rfl:  .  levofloxacin (LEVAQUIN) 750 MG tablet, Take 1 tablet (750 mg total) by mouth every other day., Disp: 3 tablet, Rfl: 0 .  linaclotide (LINZESS) 290 MCG CAPS capsule, Take 290 mcg by mouth daily., Disp: , Rfl:  .  lovastatin (MEVACOR) 10 MG tablet, Take 10 mg by mouth daily., Disp: , Rfl:  .  meloxicam (MOBIC) 15 MG tablet, Take 15 mg by mouth daily. , Disp: , Rfl:  .  Menthol, Topical  Analgesic, (BIOFREEZE EX), Apply 1 application topically daily as needed (muscle pain)., Disp: , Rfl:  .  metoprolol tartrate  (LOPRESSOR) 25 MG tablet, Take 1 tablet (25 mg total) by mouth 2 (two) times daily., Disp: 60 tablet, Rfl: 0 .  ondansetron (ZOFRAN ODT) 4 MG disintegrating tablet, Take 1 tablet (4 mg total) by mouth every 8 (eight) hours as needed for nausea or vomiting., Disp: 20 tablet, Rfl: 0 .  polyethylene glycol (MIRALAX / GLYCOLAX) packet, Take 17 g by mouth daily as needed (for constipation.). , Disp: , Rfl:  .  pregabalin (LYRICA) 75 MG capsule, Take 1 capsule (75 mg total) by mouth 2 (two) times daily., Disp: 60 capsule, Rfl: 5 .  traZODone (DESYREL) 100 MG tablet, Take 100 mg by mouth at bedtime. , Disp: , Rfl:  .  vitamin B-12 (CYANOCOBALAMIN) 500 MCG tablet, Take 500 mcg by mouth daily., Disp: , Rfl:   ROS  Constitutional: Denies any fever or chills Gastrointestinal: No reported hemesis, hematochezia, vomiting, or acute GI distress Musculoskeletal: Denies any acute onset joint swelling, redness, loss of ROM, or weakness Neurological: No reported episodes of acute onset apraxia, aphasia, dysarthria, agnosia, amnesia, paralysis, loss of coordination, or loss of consciousness  Allergies  [name] is allergic to ampicillin; ampicillin-sulbactam sodium; ambien [zolpidem tartrate]; flexeril [cyclobenzaprine]; tape; toradol [ketorolac tromethamine]; sulfamethoxazole-trimethoprim; zolpidem; cephalexin; and tramadol.  [place]  Drug: [name]  reports that she does not use drugs. Alcohol:  reports that she does not drink alcohol. Tobacco:  reports that she quit smoking about 6 years ago. Her smoking use included e-cigarettes and cigarettes. She has a 45.00 pack-year smoking history. She has never used smokeless tobacco. Medical:  has a past medical history of Acute postoperative pain (02/03/2018), Anemia, B12 deficiency, Bacteremia (10/07/2014), Bladder incontinence, Blind left eye, Cancer ([place]), Cataract, Cervical spine fracture ([place]), Chest pain (07/29/2013), Compression fracture, COPD (chronic obstructive  pulmonary disease) ([place]), DDD (degenerative disc disease), lumbar, Depression, Diffuse myofascial pain syndrome (03/24/2015), Dyspnea, Fever (10/05/2014), GERD (gastroesophageal reflux disease), Hiatal hernia, Hyperlipidemia, Hypertension, Hypertensive kidney disease, malignant (11/07/2016), Macular degeneration, On home oxygen therapy, Osteoarthritis, Osteoporosis, Pneumonia, Pulmonary embolism ([place]) (04/30/2018), Reactive airway disease, Rupture of bowel ([place]), Sepsis ([place]) (10/08/2014), Stroke [facility]), Stroke ([place]), and Wrist pain, acute (09/10/2012). Surgical: [name]  has a past surgical history that includes Abdominal surgery; Colon surgery; Appendectomy; Bladder surgery; Rectocele repair; Knee surgery; Cholecystectomy; Abdominal hysterectomy; Ostomy; Intramedullary (im) nail intertrochanteric (Right, 09/13/2016); Joint replacement (Bilateral); Toe Surgery (Right); Colostomy reversal; Total hip arthroplasty (Right, 09/30/2017); and Cataract extraction w/ intraocular lens  implant, bilateral (Bilateral). Family: family history includes Heart attack in her father; Heart failure in her mother.  Constitutional Exam  General appearance: Well nourished, well developed, and well hydrated. In no apparent acute distress There were no vitals filed for this visit. BMI Assessment: Estimated body mass index is 32.9 kg/m as calculated from the following:   Height as of 05/01/18: _0  (1.575 m).   Weight as of 05/01/18: 179 lb 14.3 oz (81.6 kg).  BMI interpretation table: BMI level Category Range association with higher incidence of chronic pain  <18 kg/m2 Underweight   18.5-24.9 kg/m2 Ideal body weight   25-29.9 kg/m2 Overweight Increased incidence by 20%  30-34.9 kg/m2 Obese (Class I) Increased incidence by 68%  35-39.9 kg/m2 Severe obesity (Class II) Increased incidence by 136%  >40 kg/m2 Extreme obesity (Class III) Increased incidence by 254%   Patient's current BMI Ideal Body weight  There is no height  or  weight on file to calculate BMI. Ideal body weight: 50.1 kg (110 lb 7.2 oz) Adjusted ideal body weight: 62.7 kg (138 lb 3.7 oz)   BMI Readings from Last 4 Encounters:  05/01/18 32.90 kg/m  04/20/18 32.92 kg/m  04/17/18 32.92 kg/m  04/13/18 32.92 kg/m   Wt Readings from Last 4 Encounters:  05/01/18 179 lb 14.3 oz (81.6 kg)  04/20/18 180 lb 0.1 oz (81.7 kg)  04/17/18 180 lb 0.1 oz (81.7 kg)  04/13/18 180 lb (81.6 kg)  Psych/Mental status: Alert, oriented x 3 (person, place, & time)       Eyes: PERLA Respiratory: No evidence of acute respiratory distress  Cervical Spine Area Exam  Skin & Axial Inspection: No masses, redness, edema, swelling, or associated skin lesions Alignment: Symmetrical Functional ROM: Unrestricted ROM      Stability: No instability detected Muscle Tone/Strength: Functionally intact. No obvious neuro-muscular anomalies detected. Sensory (Neurological): Unimpaired Palpation: No palpable anomalies              Upper Extremity (UE) Exam    Side: Right upper extremity  Side: Left upper extremity  Skin & Extremity Inspection: Skin color, temperature, and hair growth are WNL. No peripheral edema or cyanosis. No masses, redness, swelling, asymmetry, or associated skin lesions. No contractures.  Skin & Extremity Inspection: Skin color, temperature, and hair growth are WNL. No peripheral edema or cyanosis. No masses, redness, swelling, asymmetry, or associated skin lesions. No contractures.  Functional ROM: Unrestricted ROM          Functional ROM: Unrestricted ROM          Muscle Tone/Strength: Functionally intact. No obvious neuro-muscular anomalies detected.  Muscle Tone/Strength: Functionally intact. No obvious neuro-muscular anomalies detected.  Sensory (Neurological): Unimpaired          Sensory (Neurological): Unimpaired          Palpation: No palpable anomalies              Palpation: No palpable anomalies              Provocative Test(s):  Phalen's test:  deferred Tinel's test: deferred Apley's scratch test (touch opposite shoulder):  Action 1 (Across chest): deferred Action 2 (Overhead): deferred Action 3 (LB reach): deferred   Provocative Test(s):  Phalen's test: deferred Tinel's test: deferred Apley's scratch test (touch opposite shoulder):  Action 1 (Across chest): deferred Action 2 (Overhead): deferred Action 3 (LB reach): deferred    Thoracic Spine Area Exam  Skin & Axial Inspection: No masses, redness, or swelling Alignment: Symmetrical Functional ROM: Unrestricted ROM Stability: No instability detected Muscle Tone/Strength: Functionally intact. No obvious neuro-muscular anomalies detected. Sensory (Neurological): Unimpaired Muscle strength & Tone: No palpable anomalies  Lumbar Spine Area Exam  Skin & Axial Inspection: No masses, redness, or swelling Alignment: Symmetrical Functional ROM: Unrestricted ROM       Stability: No instability detected Muscle Tone/Strength: Functionally intact. No obvious neuro-muscular anomalies detected. Sensory (Neurological): Unimpaired Palpation: No palpable anomalies       Provocative Tests: Hyperextension/rotation test: deferred today       Lumbar quadrant test (Kemp's test): deferred today       Lateral bending test: deferred today       Patrick's Maneuver: deferred today                   FABER* test: deferred today                   S-I  anterior distraction/compression test: deferred today         S-I lateral compression test: deferred today         S-I Thigh-thrust test: deferred today         S-I Gaenslen's test: deferred today         *(Flexion, ABduction and External Rotation)  Gait & Posture Assessment  Ambulation: Unassisted Gait: Relatively normal for age and body habitus Posture: WNL   Lower Extremity Exam    Side: Right lower extremity  Side: Left lower extremity  Stability: No instability observed          Stability: No instability observed          Skin &  Extremity Inspection: Skin color, temperature, and hair growth are WNL. No peripheral edema or cyanosis. No masses, redness, swelling, asymmetry, or associated skin lesions. No contractures.  Skin & Extremity Inspection: Skin color, temperature, and hair growth are WNL. No peripheral edema or cyanosis. No masses, redness, swelling, asymmetry, or associated skin lesions. No contractures.  Functional ROM: Unrestricted ROM                  Functional ROM: Unrestricted ROM                  Muscle Tone/Strength: Functionally intact. No obvious neuro-muscular anomalies detected.  Muscle Tone/Strength: Functionally intact. No obvious neuro-muscular anomalies detected.  Sensory (Neurological): Unimpaired        Sensory (Neurological): Unimpaired        DTR: Patellar: deferred today Achilles: deferred today Plantar: deferred today  DTR: Patellar: deferred today Achilles: deferred today Plantar: deferred today  Palpation: No palpable anomalies  Palpation: No palpable anomalies   Assessment  Primary Diagnosis & Pertinent Problem List: The primary encounter diagnosis was Chronic knee pain (Primary Area of Pain) (Right). Diagnoses of Chronic neck pain (Secondary Area of Pain) (Bilateral) (L>R), Chronic low back pain (Tertiary Area of Pain) (Bilateral) (L>R), and Chronic pain syndrome were also pertinent to this visit.  Status Diagnosis  Controlled Controlled Controlled 1. Chronic knee pain (Primary Area of Pain) (Right)   2. Chronic neck pain (Secondary Area of Pain) (Bilateral) (L>R)   3. Chronic low back pain [facility] Area of Pain) (Bilateral) (L>R)   4. Chronic pain syndrome     Problems updated and reviewed during this visit: No problems updated. Plan of Care  Pharmacotherapy (Medications Ordered): No orders of the defined types were placed in this encounter.  Medications administered today: Judie L. Dershem had no medications administered during this visit.  Procedure Orders    No  procedure(s) ordered today   Lab Orders  No laboratory test(s) ordered today   Imaging Orders  No imaging studies ordered today   Referral Orders  No referral(s) requested today   Interventional management options: Planned, scheduled, and/or pending:   ***   Considering:   Diagnostic right-sided genicular nerve block#2 Possible right-sided genicular nerve RFA Diagnostic right Femoral + Obturator nerve block #1 Possible right Femoral + Obturator nerve RFA Diagnostic left cervical epidural steroid injection Diagnostic bilateral cervical facet blocks Diagnostic bilateral intra-articular shoulder joint injection Diagnostic bilateral suprascapular nerve block Possible bilateral suprascapular nerve RFA Diagnosticright-sidedlumbar facet block #3 Possibleright-sidedlumbar facet RFA Diagnostic right-sided sacroiliac joint block #3 Possible right-sided sacroiliac joint RFA   Palliative PRN treatment(s):   None at this time   Provider-requested follow-up: No follow-ups on file.  Future Appointments  Date Time Provider [place]  05/06/2018  2:00 PM Dossie Arbour,  Beatriz Chancellor, MD [facility] None  05/13/2018 10:30 AM [name], FNP [facility]  06/01/2018  4:00 PM Penumalli, Earlean Polka, MD [facility] None   Primary Care Physician: Alvester Chou, NP [facility] Note by: Gaspar Cola, MD Date: 05/06/2018; Time: 7:21 AM

## 2018-05-03 NOTE — Progress Notes (Addendum)
Patient was confused and agitated.  Not alert to situation. Stated that "she wanted to get out of here and get back to the hospital".  Patient threw her meal tray across the floor and had pulled out the SpO2 wire from the [employer] monitor and had it coiled up tightly in her right hand stating "I will try to hit someone with this if anyone tries to hit me".  I notified provider, Dr. Marylu Lund, MD of new onset of confusion and agitation. Spoke with Dr. Wyline Copas by phone, who placed orders for 1 mg Haldol once, ABG's, U/A, and chest X-ray.  For safety, placed mittens on patients. Will continue to monitor.

## 2018-05-03 NOTE — Progress Notes (Signed)
[name], patient's sister called stating "why is she hallucinating".  Per sister, patient thought she was "in a hole"  Assessed patient, who was alert and oriented to self, year, date of birth, but states that "she was in a basement".  After reorienting patient to being in the hospital.  Patient then affirmed she was in the hospital when asked where she was.  Paged provider Dr. Wyline Copas, MD.  Will continue to monitor.

## 2018-05-03 NOTE — Progress Notes (Signed)
Patient's sister [name] arrived on unit and present at bedside.  Cathy demanded to know why patient was not receiving IV fluids stating that "the patient must be dehydrated". I assured [name] that we were providing fluids by mouth to the patient and monitoring fluid status and updating the provider of any changes in the patient's condition.  [name] furthermore asked me "what medications were causing the patient's delirium?".  Cathy further asked me why certain procedures or test were not being done and that "if this continues I am taking her the [facility]".  I requested that Dr. Wyline Copas, MD speak with the sister [name] to address any questions or concerns that she had about the plan of care for the patient  Dr. Wyline Copas spoke with sister [name] at length by phone this evening.

## 2018-05-03 NOTE — Progress Notes (Signed)
PROGRESS NOTE    [name]  WFU:[id number] DOB: 26-Feb-1941 DOA: 04/30/2018 PCP: Alvester Chou, NP    Brief Narrative:  77 y.o. female, 3 significant for COPD/pneumonia, hyperlipidemia and hypertension who was recently discharged from our facility on 1130 after respiratory failure, pneumonia, UTI.  Patient was treated with IV antibiotics, nebulizer treatment and was discharged home on 3 L of nasal cannula.  The patient is presenting today for 1 day history of acute chest pain with shortness of breath.  The chest pain was on the left, worse with deep breath.  She took a baby aspirin prior to arrival. In the emergency room CT of the chest showed bilateral, multiple small PEs that involve the right lower lobe right upper lobe and lingula with extensive airspace disease involving both lower lobes.  Patient denies any fever or chills.  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 BLE DVT noted. RV systolic function normal -presently remains on 2LNC  chronic respiratory failure and COPD -Presently no wheezing on auscultation -continue with duo nebs as needed -currently on [facility] -see below, acute metabolic encephalopathy noted -Pt recently admitted for COPD exacerbation. -ABG pending  Hypertension -Continue with amlodipine -BP stable and controlled -Stable at this time  Anxiety -Continue with Xanax/Celexa -appears agitated per below, give trial of haldol 1mg   Hyperlipidemia -Continue on statin as tolerated  GERD -Continue with PPI as tolerated -Presently stable  Toxic metabolic  encephalopathy, new finding -New since overnight -Patient seemed oriented this AM, however mentation progressively worsened throughout the day -Recent UA reviewed, unremarkable -CT chest reviewed, ?edema vs infiltrate noted -Will order follow up CXR, ABG, repeat UA -Give IV haldol x1 as pt is agitated currently  DVT prophylaxis: Heparin gtt Code Status: Full Family Communication: Pt in room, family not at bedside Disposition Plan: Uncertain at this time  Consultants:     Procedures:     Antimicrobials: Anti-infectives (From admission, onward)   None      Subjective: Feeling well this AM, now very confused and agitated  Objective: Vitals:   05/03/18 0751 05/03/18 1209 05/03/18 1322 05/03/18 1357  BP:  124/67 (!) 105/58   Pulse:  75 70   Resp:  18 16   Temp:  99 F (37.2 C) 98.4 F (36.9 C)   TempSrc:  Oral Oral   SpO2: 96% 95% 94% 94%  Weight:      Height:        Intake/Output Summary (Last 24 hours) at 05/03/2018 1454 Last data filed at 05/03/2018 1155 Gross per 24 hour  Intake 280 ml  Output 1900 ml  Net -1620 ml   Filed Weights   05/01/18 1336  Weight: 81.6 kg    Examination: General exam: Conversant, agitated Respiratory system: normal chest rise, clear, no audible wheezing Cardiovascular system: regular rhythm, s1-s2 Gastrointestinal system: Nondistended, nontender, pos BS Central nervous system: No seizures, no tremors Extremities: No cyanosis, no joint deformities Skin: No rashes, no pallor Psychiatry: confused   Data Reviewed: I have personally reviewed following labs and imaging studies  CBC: Recent Labs  Lab 04/30/18 0920 05/01/18 0723 05/02/18 0311 05/03/18 2025  WBC 8.5 6.5 6.7 5.8  NEUTROABS 6.3  --   --   --   HGB 11.4* 10.6* 11.0* 11.6*  HCT 38.4 35.3* 37.0 38.1  MCV 97.7 98.6 98.7 98.2  PLT 315 280 341 371   Basic Metabolic Panel: Recent Labs  Lab 04/30/18 0920 05/01/18 0723  NA 140 143  K 3.4* 4.3  CL 108 109    CO2 25 26  GLUCOSE 87 89  BUN 11 9  CREATININE 1.06* 0.93  CALCIUM 9.3 9.0   GFR: Estimated Creatinine Clearance: 50.9 mL/min (by C-G formula based on SCr of 0.93 mg/dL). Liver Function Tests: Recent Labs  Lab 04/30/18 0920  AST 27  ALT 25  ALKPHOS 51  BILITOT 0.5  PROT 6.4*  ALBUMIN 2.2*   Recent Labs  Lab 04/30/18 0920  LIPASE 21   No results for input(s): AMMONIA in the last 168 hours. Coagulation Profile: No results for input(s): INR, PROTIME in the last 168 hours. Cardiac Enzymes: Recent Labs  Lab 04/30/18 0920  TROPONINI <0.03   BNP (last 3 results) No results for input(s): PROBNP in the last 8760 hours. HbA1C: No results for input(s): HGBA1C in the last 72 hours. CBG: No results for input(s): GLUCAP in the last 168 hours. Lipid Profile: No results for input(s): CHOL, HDL, LDLCALC, TRIG, CHOLHDL, LDLDIRECT in the last 72 hours. Thyroid Function Tests: No results for input(s): TSH, T4TOTAL, FREET4, T3FREE, THYROIDAB in the last 72 hours. Anemia Panel: No results for input(s): VITAMINB12, FOLATE, FERRITIN, TIBC, IRON, RETICCTPCT in the last 72 hours. Sepsis Labs: No results for input(s): PROCALCITON, LATICACIDVEN in the last 168 hours.  No results found for this or any previous visit (from the past 240 hour(s)).   Radiology Studies: Vas [place] Lower Extremity Venous (dvt)  Result Date: 05/03/2018  Lower Venous Study Indications: Pulmonary embolism.  Risk Factors: Confirmed PE. Limitations: Body habitus. Comparison Study: CTA on 04/30/2018 Performing Technologist: Lorina Rabon  Examination Guidelines: A complete evaluation includes B-mode imaging, spectral Doppler, color Doppler, and power Doppler as needed of all accessible portions of each vessel. Bilateral testing is considered an integral part of a complete examination. Limited examinations for reoccurring indications may be performed as noted.  Right Venous Findings:  +---------+---------------+---------+-----------+----------+-----------------+          CompressibilityPhasicitySpontaneityPropertiesSummary           +---------+---------------+---------+-----------+----------+-----------------+ CFV      Full           Yes      Yes                                    +---------+---------------+---------+-----------+----------+-----------------+ SFJ      Full                                                           +---------+---------------+---------+-----------+----------+-----------------+ FV Prox  Full                                                           +---------+---------------+---------+-----------+----------+-----------------+ FV Mid  Full                                                           +---------+---------------+---------+-----------+----------+-----------------+ FV DistalFull                                                           +---------+---------------+---------+-----------+----------+-----------------+ PFV      Full                                                           +---------+---------------+---------+-----------+----------+-----------------+ POP      Full           Yes      Yes                                    +---------+---------------+---------+-----------+----------+-----------------+ PTV      None                    No                   Age Indeterminate +---------+---------------+---------+-----------+----------+-----------------+ PERO     None                    No                   Age Indeterminate +---------+---------------+---------+-----------+----------+-----------------+  Left Venous Findings: +---------+---------------+---------+-----------+----------+-------+          CompressibilityPhasicitySpontaneityPropertiesSummary +---------+---------------+---------+-----------+----------+-------+ CFV      Full           Yes      Yes                           +---------+---------------+---------+-----------+----------+-------+ SFJ      Full                                                 +---------+---------------+---------+-----------+----------+-------+ FV Prox  Full                                                 +---------+---------------+---------+-----------+----------+-------+ FV Mid   Full                                                 +---------+---------------+---------+-----------+----------+-------+ FV DistalFull                                                 +---------+---------------+---------+-----------+----------+-------+  PFV      Full                                                 +---------+---------------+---------+-----------+----------+-------+ POP      Full           Yes      Yes                          +---------+---------------+---------+-----------+----------+-------+ PTV      None                                         Acute   +---------+---------------+---------+-----------+----------+-------+ PERO     None                                         Acute   +---------+---------------+---------+-----------+----------+-------+    Summary: Right: Findings consistent with age indeterminate deep vein thrombosis involving the right posterior tibial vein, and right peroneal vein. No cystic structure found in the popliteal fossa. Left: Findings consistent with acute deep vein thrombosis involving the left posterior tibial vein, and left peroneal vein. No cystic structure found in the popliteal fossa.  *See table(s) above for measurements and observations. Electronically signed by Harold Barban MD on 05/03/2018 at 8:21:23 AM.    Final     Scheduled Meds: . amLODipine  5 mg Oral Daily  . apixaban  10 mg Oral BID   Followed by  . [START ON 05/09/2018] apixaban  5 mg Oral BID  . budesonide  1 mg Nebulization BID  . citalopram  20 mg Oral Daily  . feeding supplement  1 Container  Oral TID BM  . haloperidol lactate  1 mg Intravenous Once  . ipratropium-albuterol  3 mL Nebulization TID  . metoprolol tartrate  25 mg Oral BID  . pantoprazole  40 mg Oral Daily  . pravastatin  80 mg Oral q1800  . pregabalin  75 mg Oral BID  . traZODone  100 mg Oral QHS   Continuous Infusions:    LOS: 3 days   Marylu Lund, MD [facility] Pager On Amion  If 7PM-7AM, please contact night-coverage 05/03/2018, 2:54 PM

## 2018-05-03 NOTE — Progress Notes (Signed)
PT Cancellation Note  Patient Details Name: FELESHIA ZUNDEL MRN: [id number] DOB: 1940-09-01   Cancelled Treatment:    Reason Eval/Treat Not Completed: Other (comment). Orders received, chart reviewed. Pt with recent agitation and given Haldol. Asleep upon arrival. Will follow up tomorrow.  Ellamae Sia, PT, DPT Acute Rehabilitation Services Pager [phone] Office [phone]    Willy Eddy 05/03/2018, 4:12 PM

## 2018-05-03 NOTE — Progress Notes (Signed)
Updated sister [name] about patient per phone request.  Patient is alert and oriented x3.  Spoke in person with provider Dr. Wyline Copas, MD.  Per provider, Dr. Wyline Copas, "patient's mental status related to being in an hospital setting as ICU delirium".  Provider aware and will continue to monitor.

## 2018-05-04 DIAGNOSIS — L8990 Pressure ulcer of unspecified site, unspecified stage: Secondary | ICD-10-CM

## 2018-05-04 LAB — BASIC METABOLIC PANEL
Anion gap: 7 (ref 5–15)
BUN: 7 mg/dL — ABNORMAL LOW (ref 8–23)
CO2: 31 mmol/L (ref 22–32)
Calcium: 9.1 mg/dL (ref 8.9–10.3)
Chloride: 103 mmol/L (ref 98–111)
Creatinine, Ser: 0.95 mg/dL (ref 0.44–1.00)
GFR calc Af Amer: >60 mL/min (ref >60)
GFR calc non Af Amer: 58 mL/min — ABNORMAL LOW (ref >60)
Glucose, Bld: 82 mg/dL (ref 70–99)
Potassium: 4.1 mmol/L (ref 3.5–5.1)
Sodium: 141 mmol/L (ref 135–145)

## 2018-05-04 MED ORDER — METOPROLOL TARTRATE 25 MG PO TABS: 25.0000 mg | ORAL_TABLET | Freq: Two times a day (BID) | ORAL | 0 refills | Status: DC

## 2018-05-04 MED ORDER — APIXABAN 5 MG PO TABS: 5.0000 mg | ORAL_TABLET | Freq: Two times a day (BID) | ORAL | 0 refills | Status: DC

## 2018-05-04 MED ORDER — APIXABAN 5 MG PO TABS: 10.0000 mg | ORAL_TABLET | Freq: Two times a day (BID) | ORAL | 0 refills | Status: DC

## 2018-05-04 MED ORDER — BENZONATATE 200 MG PO CAPS: 200.0000 mg | ORAL_CAPSULE | Freq: Two times a day (BID) | ORAL | 0 refills | Status: DC | PRN

## 2018-05-04 MED FILL — ELIQUIS STARTER PACK 5 MG T: 5 | 30 days supply | Qty: 74 | Fill #0

## 2018-05-04 MED FILL — BENZONATATE 200 MG CAPS: 200 | 10 days supply | Qty: 20 | Fill #0

## 2018-05-04 MED FILL — METOPROLOL TARTRATE 25 MG T: 25 | 30 days supply | Qty: 60 | Fill #0

## 2018-05-04 NOTE — Discharge Instructions (Signed)
Information on my medicine - ELIQUIS (apixaban)  This medication education was reviewed with me or my healthcare representative as part of my discharge preparation.  The pharmacist that spoke with me during my hospital stay was:  Wayland Salinas, [facility]  Why was Eliquis prescribed for you? Eliquis was prescribed to treat blood clots that may have been found in the veins of your legs (deep vein thrombosis) or in your lungs (pulmonary embolism) and to reduce the risk of them occurring again.  What do You need to know about Eliquis ? The starting dose is 10 mg (two 5 mg tablets) taken TWICE daily for the FIRST SEVEN (7) DAYS, then on (enter date)  05/09/18 at night  the dose is reduced to ONE 5 mg tablet taken TWICE daily.  Eliquis may be taken with or without food.   Try to take the dose about the same time in the morning and in the evening. If you have difficulty swallowing the tablet whole please discuss with your pharmacist how to take the medication safely.  Take Eliquis exactly as prescribed and DO NOT stop taking Eliquis without talking to the doctor who prescribed the medication.  Stopping may increase your risk of developing a new blood clot.  Refill your prescription before you run out.  After discharge, you should have regular check-up appointments with your healthcare provider that is prescribing your Eliquis.    What do you do if you miss a dose? If a dose of ELIQUIS is not taken at the scheduled time, take it as soon as possible on the same day and twice-daily administration should be resumed. The dose should not be doubled to make up for a missed dose.  Important Safety Information A possible side effect of Eliquis is bleeding. You should call your healthcare provider right away if you experience any of the following: ? Bleeding from an injury or your nose that does not stop. ? Unusual colored urine (red or dark brown) or unusual colored stools (red or  black). ? Unusual bruising for unknown reasons. ? A serious fall or if you hit your head (even if there is no bleeding).  Some medicines may interact with Eliquis and might increase your risk of bleeding or clotting while on Eliquis. To help avoid this, consult your healthcare provider or pharmacist prior to using any new prescription or non-prescription medications, including herbals, vitamins, non-steroidal anti-inflammatory drugs (NSAIDs) and supplements.  This website has more information on Eliquis (apixaban): http://[email or web address].com/eliquis/home

## 2018-05-04 NOTE — Progress Notes (Signed)
D/C instructions given to pt and daughter. Medications including eliquis reviewed. All questions answered. IV removed, clean and intact. Telemetry removed. Daughter to escort home w/ home 02.   Clyde Canterbury, RN

## 2018-05-04 NOTE — Evaluation (Signed)
Physical Therapy Evaluation Patient Details Name: [name] MRN: [id number] DOB: Mar 03, 1941 Today's Date: 05/04/2018   History of Present Illness  77 year old female was admitted for COPD and PNA with PE discovered, chest pain, on 3L O2 but recent admission for resp failure, PNA, UTI.    Clinical Impression  Pt was seen for mobility and noted O2 off for a length of time before PT arrived.  Pt had O2 replaced and after a rest sat up on side of bed.  Has low O2 sats at rest of 93% and could not get an accurate reading with her pulse ox probe with activity.  Sat side of bed and transition to chair done, had mod assist to stand and min to pivot and step to chair.  Pt is up with breakfast placed in front of her during which time she began to make socially inappropriate faces about the food (sticking out her tongue).  Daughter reports she did not notice O2 as she is visually impaired but tells PT she is planning to take pt home.  Talked with daughter about bad previous experiences in using SNF services, but reinforced that pt will get around the clock care with more rehab at SNF than home.  Will follow up with PT acutely to work on strength, balance and endurance with O2 in place and using sat monitors.    Follow Up Recommendations SNF    Equipment Recommendations  None recommended by PT    Recommendations for Other Services       Precautions / Restrictions Precautions Precautions: Fall Precaution Comments: desaturated upon arrival as she had O2 off Restrictions Weight Bearing Restrictions: No      Mobility  Bed Mobility Overal bed mobility: Needs Assistance Bed Mobility: Supine to Sit     Supine to sit: Min assist     General bed mobility comments: minor help to lift trunk and assist scooting out to side of bed  Transfers Overall transfer level: Needs assistance Equipment used: Rolling walker (2 wheeled);1 person hand held assist Transfers: Sit to/from Stand Sit to Stand:  Mod assist Stand pivot transfers: Min assist;Mod assist       General transfer comment: mod to power up and min to turn to chair  Ambulation/Gait Ambulation/Gait assistance: Min assist Gait Distance (Feet): 5 Feet Assistive device: Rolling walker (2 wheeled) Gait Pattern/deviations: Step-to pattern Gait velocity: decreased Gait velocity interpretation: <1.8 ft/sec, indicate of risk for recurrent falls General Gait Details: probe for monitor would not give a reading for the transition to the chair  Stairs            Wheelchair Mobility    Modified Rankin (Stroke Patients Only)       Balance Overall balance assessment: Needs assistance Sitting-balance support: Bilateral upper extremity supported;Feet supported Sitting balance-Leahy Scale: Fair     Standing balance support: Bilateral upper extremity supported;During functional activity Standing balance-Leahy Scale: Poor Standing balance comment: requires support of walker in standing                             Pertinent Vitals/Pain Pain Assessment: Faces Faces Pain Scale: Hurts little more Pain Location: scab on R lower leg Pain Descriptors / Indicators: Sore Pain Intervention(s): Monitored during session    Home Living Family/patient expects to be discharged to:: Private residence Living Arrangements: Children Available Help at Discharge: Family;Available 24 hours/day Type of Home: Mobile home Home Access: Ramped entrance  Home Layout: One level Home Equipment: Walker - 2 wheels;Walker - 4 wheels;Bedside commode;Wheelchair - Psychologist, educational      Prior Function Level of Independence: Needs assistance   Gait / Transfers Assistance Needed: Rollator for short trips  ADL's / Homemaking Assistance Needed: family sets her up to take a basin bath  Comments: rollator to walk short walks, daughter assists her in the home with housework and cooking     Hand Dominance   Dominant Hand:  Right    Extremity/Trunk Assessment   Upper Extremity Assessment Upper Extremity Assessment: Overall WFL for tasks assessed    Lower Extremity Assessment Lower Extremity Assessment: Overall WFL for tasks assessed    Cervical / Trunk Assessment Cervical / Trunk Assessment: Kyphotic  Communication   Communication: No difficulties  Cognition Arousal/Alertness: Awake/alert Behavior During Therapy: WFL for tasks assessed/performed Overall Cognitive Status: Within Functional Limits for tasks assessed                                        General Comments General comments (skin integrity, edema, etc.): Pt was in her room without use of O2 initially and was using an O2 sat probe with poor function to get readings    Exercises     Assessment/Plan    PT Assessment Patient needs continued PT services  PT Problem List Decreased strength;Decreased range of motion;Decreased activity tolerance;Decreased balance;Decreased mobility;Decreased cognition;Decreased coordination;Decreased knowledge of use of DME;Decreased safety awareness;Cardiopulmonary status limiting activity;Decreased skin integrity;Pain       PT Treatment Interventions DME instruction;Gait training;Functional mobility training;Therapeutic activities;Therapeutic exercise;Balance training;Neuromuscular re-education;Patient/family education    PT Goals (Current goals can be found in the Care Plan section)  Acute Rehab PT Goals Patient Stated Goal: none stated PT Goal Formulation: Patient unable to participate in goal setting Time For Goal Achievement: 05/18/18 Potential to Achieve Goals: Fair    Frequency Min 2X/week   Barriers to discharge Decreased caregiver support(daughter is visually impaired)      Co-evaluation               AM-PAC PT "6 Clicks" Mobility  Outcome Measure Help needed turning from your back to your side while in a flat bed without using bedrails?: A Little Help needed  moving from lying on your back to sitting on the side of a flat bed without using bedrails?: A Little Help needed moving to and from a bed to a chair (including a wheelchair)?: A Lot Help needed standing up from a chair using your arms (e.g., wheelchair or bedside chair)?: A Lot Help needed to walk in hospital room?: A Little Help needed climbing 3-5 steps with a railing? : Total 6 Click Score: 14    End of Session Equipment Utilized During Treatment: Oxygen Activity Tolerance: Treatment limited secondary to medical complications (Comment);Patient limited by fatigue(daughter was agitated but not pt) Patient left: in chair;with call bell/phone within reach;with chair alarm set;with family/visitor present Nurse Communication: Mobility status PT Visit Diagnosis: Unsteadiness on feet (R26.81);Muscle weakness (generalized) (M62.81);Difficulty in walking, not elsewhere classified (R26.2);Adult, failure to thrive (R62.7)    Time: 6440-3474 PT Time Calculation (min) (ACUTE ONLY): 40 min   Charges:   PT Evaluation $PT Eval Moderate Complexity: 1 Mod PT Treatments $Therapeutic Exercise: 8-22 mins $Therapeutic Activity: 8-22 mins       Ramond Dial 05/04/2018, 12:33 PM   Mee Hives, PT MS Acute Rehab Dept.  Number: [place] [facility] [place]

## 2018-05-04 NOTE — Discharge Summary (Signed)
Physician Discharge Summary  [name] VPX:[id number] DOB: 10/14/1940 DOA: 04/30/2018  PCP: Alvester Chou, NP  Admit date: 04/30/2018 Discharge date: 05/04/2018  Admitted From: Home Disposition:  Home  Recommendations for Outpatient Follow-up:  1. Follow up with PCP in 1-2 weeks 2. Please obtain BMP/CBC in one week 3. Please follow up on the following pending results:  Home Health:PT, RN   Discharge Condition:Stable CODE STATUS:Full Diet recommendation: Heart healthy   Brief/Interim Summary: 77 y.o.female,hx significant for COPD/pneumonia, hyperlipidemia and hypertension who was recently discharged from our facility on 1130 after respiratory failure, pneumonia, UTI. Patient was treated with IV antibiotics, nebulizer treatment and was discharged home on 3 L of nasal cannula. The patient is presenting today for 1 day history of acute chest pain with shortness of breath. The chest pain was on the left, worse with deep breath. She took a baby aspirin prior to arrival. In the emergency room CT of the chest showed bilateral, multiple small PEs that involve the right lower lobe right upper lobe and lingula with extensive airspace disease involving both lower lobes. Patient denies any fever or chills.  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 BLE DVT noted. RV systolic function normal -presently remains on baseline home O2  chronic respiratory failure and COPD -Presently no wheezing on auscultation -continue with duo nebs as needed -currently on [facility] -see below, acute metabolic  encephalopathy noted -Pt recently admitted for COPD exacerbation. -ABG reviewed, unremarkable  Hypertension -Continue with amlodipine -BP stable and controlled -Stable at this time  Anxiety -Continue with Xanax/Celexa -appears agitated per below, give trial of haldol 1mg   Hyperlipidemia -Continue on statin as tolerated  GERD -Continue with PPI as tolerated -Presently stable  Toxic metabolic encephalopathy, new finding -New since overnight -Patient seemed oriented this AM, however mentation progressively worsened throughout the day -Recent UA reviewed, unremarkable -CT chest reviewed, ?edema vs infiltrate noted -Will order follow up CXR with improving infiltrate - repeat UA unremarkable -Pt now alert, oriented, aware of plan. Question underlying dementia prior to admit   Discharge Instructions   Allergies as of 05/04/2018      Reactions   Ampicillin Anaphylaxis, Nausea Only, Swelling   SEVERE HEADACHE MUSCLE CRAMPS ANGIOEDEMA THRUSH PATIENT HAS HAD A PCN REACTION WITH IMMEDIATE RASH, FACIAL/TONGUE/THROAT SWELLING, SOB, OR LIGHTHEADEDNESS WITH HYPOTENSION:  #  #  YES  #  #  Has patient had a PCN reaction causing severe rash involving mucus membranes or skin necrosis: No Has patient had a PCN reaction that required hospitalization:No Has patient had a PCN reaction occurring within the last 10 years: No.   Ampicillin-sulbactam Sodium Anaphylaxis, Other (See Comments)   SEVERE HEADACHE MUSCLE CRAMPS ANGIOEDEMA THRUSH PATIENT HAS HAD A PCN REACTION WITH IMMEDIATE RASH, FACIAL/TONGUE/THROAT SWELLING, SOB, OR LIGHTHEADEDNESS WITH HYPOTENSION: # # YES # # Has patient had a PCN reaction causing severe rash involving mucus membranes or skin necrosis: No Has patient had a PCN reaction that required hospitalization:No Has patient had a PCN reaction occurring within the last 10 years: No   Ambien [zolpidem Tartrate] Nausea And Vomiting, Other (See Comments)    HALLUCINATIONS SWEATING  Flexeril [cyclobenzaprine] Other (See Comments)   EXTRAPYRAMIDAL MOVEMENT INVOLUNTARY MUSCLE JERKING   Tape Itching, Rash, Other (See Comments)   Paper tape only. Adhesive tape=itching/burning/rash   Toradol [ketorolac Tromethamine] Nausea Only, Other (See Comments)   HEADACHE  BACKACHE   Sulfamethoxazole-trimethoprim    UNSPECIFIED REACTION    Zolpidem    Hallucinations   Cephalexin Rash, Other (See Comments)   HEADACHES   Tramadol Rash, Other (See Comments)   HEADACHE       Medication List    STOP taking these medications   Potassium 95 MG Tabs     TAKE these medications   albuterol 108 (90 Base) MCG/ACT inhaler Commonly known as:  PROVENTIL HFA;VENTOLIN HFA Inhale 2 puffs into the lungs every 6 (six) hours as needed for wheezing or shortness of breath.   albuterol 1.25 MG/3ML nebulizer solution Commonly known as:  ACCUNEB Take 1 ampule by nebulization every 6 (six) hours as needed for wheezing.   alendronate 70 MG tablet Commonly known as:  FOSAMAX Take 70 mg by mouth every Monday. Take with a full glass of water on an empty stomach.   ALPRAZolam 1 MG tablet Commonly known as:  XANAX Take 1 mg by mouth at bedtime as needed for sleep.   amLODipine 5 MG tablet Commonly known as:  NORVASC Take 5 mg by mouth daily.   apixaban 5 MG Tabs tablet Commonly known as:  ELIQUIS Take 2 tablets (10 mg total) by mouth 2 (two) times daily for 4 days.   apixaban 5 MG Tabs tablet Commonly known as:  ELIQUIS Take 1 tablet (5 mg total) by mouth 2 (two) times daily. Start after 10mg  twice day dosing Start taking on:  05/09/2018   benzonatate 200 MG capsule Commonly known as:  TESSALON Take 1 capsule (200 mg total) by mouth 2 (two) times daily as needed for cough.   BIOFREEZE EX Apply 1 application topically daily as needed (muscle pain).   Calcium 500-100 MG-UNIT Chew Chew 1 tablet by mouth daily.   CALCIUM/D3 ADULT GUMMIES PO Take 1 tablet  by mouth daily.   citalopram 20 MG tablet Commonly known as:  CELEXA Take 20 mg by mouth daily.   docusate sodium 100 MG capsule Commonly known as:  COLACE Take 200 mg by mouth 2 (two) times daily as needed for moderate constipation.   esomeprazole 40 MG capsule Commonly known as:  NEXIUM Take 40 mg by mouth daily.   fluticasone 220 MCG/ACT inhaler Commonly known as:  FLOVENT HFA Inhale 1 puff into the lungs 2 (two) times daily.   Iron 325 (65 Fe) MG Tabs Take 1 tablet (325 mg total) by mouth 2 (two) times daily. What changed:  when to take this   Krill Oil 500 MG Caps Take 1 capsule by mouth daily.   levofloxacin 750 MG tablet Commonly known as:  LEVAQUIN Take 1 tablet (750 mg total) by mouth every other day.   LINZESS 290 MCG Caps capsule Generic drug:  linaclotide Take 290 mcg by mouth daily.   lovastatin 10 MG tablet Commonly known as:  MEVACOR Take 10 mg by mouth daily.   meloxicam 15 MG tablet Commonly known as:  MOBIC Take 15 mg by mouth daily.   metoprolol tartrate 25 MG tablet Commonly known as:  LOPRESSOR Take 1 tablet (25 mg total) by mouth 2 (two) times daily.   MUCINEX 600 MG 12 hr tablet Generic drug:  guaiFENesin Take 600 mg by mouth 2 (two) times daily as  needed for cough.   ondansetron 4 MG disintegrating tablet Commonly known as:  ZOFRAN-ODT Take 1 tablet (4 mg total) by mouth every 8 (eight) hours as needed for nausea or vomiting.   polyethylene glycol packet Commonly known as:  MIRALAX / GLYCOLAX Take 17 g by mouth daily as needed (for constipation.).   pregabalin 75 MG capsule Commonly known as:  LYRICA Take 1 capsule (75 mg total) by mouth 2 (two) times daily.   traZODone 100 MG tablet Commonly known as:  DESYREL Take 100 mg by mouth at bedtime.   vitamin B-12 500 MCG tablet Commonly known as:  CYANOCOBALAMIN Take 500 mcg by mouth daily.   VITAMIN D HIGH POTENCY PO Take 5,000 Units by mouth daily.      Follow-up  Information    Alvester Chou, NP. Schedule an appointment as soon as possible for a visit in 2 week(s).   Specialty:  Nurse Practitioner Contact information: [facility] [place] [phone]          Allergies  Allergen Reactions  . Ampicillin Anaphylaxis, Nausea Only and Swelling    SEVERE HEADACHE MUSCLE CRAMPS ANGIOEDEMA THRUSH PATIENT HAS HAD A PCN REACTION WITH IMMEDIATE RASH, FACIAL/TONGUE/THROAT SWELLING, SOB, OR LIGHTHEADEDNESS WITH HYPOTENSION:  #  #  YES  #  #  Has patient had a PCN reaction causing severe rash involving mucus membranes or skin necrosis: No Has patient had a PCN reaction that required hospitalization:No Has patient had a PCN reaction occurring within the last 10 years: No.  . Ampicillin-Sulbactam Sodium Anaphylaxis and Other (See Comments)    SEVERE HEADACHE MUSCLE CRAMPS ANGIOEDEMA THRUSH PATIENT HAS HAD A PCN REACTION WITH IMMEDIATE RASH, FACIAL/TONGUE/THROAT SWELLING, SOB, OR LIGHTHEADEDNESS WITH HYPOTENSION: # # YES # # Has patient had a PCN reaction causing severe rash involving mucus membranes or skin necrosis: No Has patient had a PCN reaction that required hospitalization:No Has patient had a PCN reaction occurring within the last 10 years: No   . Ambien [Zolpidem Tartrate] Nausea And Vomiting and Other (See Comments)    HALLUCINATIONS SWEATING  . Flexeril [Cyclobenzaprine] Other (See Comments)    EXTRAPYRAMIDAL MOVEMENT INVOLUNTARY MUSCLE JERKING  . Tape Itching, Rash and Other (See Comments)    Paper tape only. Adhesive tape=itching/burning/rash  . Toradol [Ketorolac Tromethamine] Nausea Only and Other (See Comments)    HEADACHE  BACKACHE  . Sulfamethoxazole-Trimethoprim     UNSPECIFIED REACTION   . Zolpidem     Hallucinations  . Cephalexin Rash and Other (See Comments)    HEADACHES  . Tramadol Rash and Other (See Comments)    HEADACHE      Procedures/Studies: Dg Chest 2  View  Result Date: 04/30/2018 CLINICAL DATA:  Left-sided chest pain and shortness of breath. EXAM: CHEST - 2 VIEW COMPARISON:  04/22/2018 FINDINGS: Cardiomediastinal silhouette is normal. Mediastinal contours appear intact. Tortuosity and calcific atherosclerotic disease of the aorta. There is no evidence of pleural effusion or pneumothorax. Bilateral patchy airspace consolidation with peripheral predominance with coarsening of the interstitial markings. Osseous structures are without acute abnormality. Soft tissues are grossly normal. IMPRESSION: Bilateral patchy airspace consolidation with peripheral predominance with coarsening of the interstitial markings. The aeration of the lungs has worsened when compared to the prior radiographs. Electronically Signed   By: Fidela Salisbury M.D.   On: 04/30/2018 09:55   Dg Chest 2 View  Result Date: 04/20/2018 CLINICAL DATA:  Chest pain, shortness of Breath EXAM:  CHEST - 2 VIEW COMPARISON:  11/19/2016 FINDINGS: Cardiomegaly. Patchy bilateral airspace opacities, right greater than left. This is most confluent in the right lung base. Cannot exclude pneumonia. No effusions or acute bony abnormality. IMPRESSION: Cardiomegaly. Patchy bilateral opacities, most pronounced in the right lower lobe concerning for pneumonia. Electronically Signed   By: Rolm Baptise M.D.   On: 04/20/2018 11:28   Ct Head Wo Contrast  Result Date: 05/03/2018 CLINICAL DATA:  New mental status change on anticoagulation for PE. Hx stroke, spesis, HTN, skin cancer, bacteremia, and anemia. EXAM: CT HEAD WITHOUT CONTRAST TECHNIQUE: Contiguous axial images were obtained from the base of the skull through the vertex without intravenous contrast. COMPARISON:  06/25/2017 FINDINGS: Brain: No evidence of acute infarction, hemorrhage, hydrocephalus, extra-axial collection or mass lesion/mass effect. There is mild generalized atrophy and periventricular white matter hypoattenuation consistent with mild  chronic microvascular ischemic change, stable from the prior exam. Vascular: No hyperdense vessel or unexpected calcification. Skull: Normal. Negative for fracture or focal lesion. Sinuses/Orbits: No acute finding. Other: None. IMPRESSION: 1. No acute intracranial abnormalities. 2. Mild atrophy and chronic microvascular ischemic change. Electronically Signed   By: Lajean Manes M.D.   On: 05/03/2018 21:34   Ct Angio Chest Pe W/cm &/or Wo Cm  Result Date: 04/30/2018 CLINICAL DATA:  Pulmonary embolus suspected. High probability. Left-sided rib pain. To kidney a and tachycardia. EXAM: CT ANGIOGRAPHY CHEST WITH CONTRAST TECHNIQUE: Multidetector CT imaging of the chest was performed using the standard protocol during bolus administration of intravenous contrast. Multiplanar CT image reconstructions and MIPs were obtained to evaluate the vascular anatomy. CONTRAST:  34mL ISOVUE-370 IOPAMIDOL (ISOVUE-370) INJECTION 76% COMPARISON:  None. FINDINGS: Cardiovascular: Heart size is normal. No pericardial effusion. Aortic atherosclerosis. Calcification in the left main coronary artery and LAD noted. The main pulmonary artery measures 3.6 cm in transverse diameter. There is a filling defect identified within the right lower lobe lobar pulmonary artery, image 209/7. There is a filling defect within the right upper lobe lobar pulmonary artery, image 90/10. Complete occlusion scratch set segmental pulmonary artery filling defect to a the left upper lobe is identified, image 53/5. There is complete occlusion of the segmental pulmonary artery to the lingula. The RV to LV ratio is equal to 5.1/4.4 equals 1.15. Mediastinum/Nodes: Normal appearance of the thyroid gland. The trachea appears patent and is midline. Normal appearance of the esophagus. No enlarged axillary or supraclavicular lymph nodes. Prominent mediastinal lymph nodes identified without adenopathy. Lungs/Pleura: Extensive bilateral patchy areas of airspace  consolidation and ground-glass attenuation throughout both lungs. No pleural effusion identified. Calcified granuloma within the left apex. Upper Abdomen: No acute abnormality identified. Musculoskeletal: No chest wall abnormality. No acute or significant osseous findings. Unchanged T2 compression deformity. Review of the MIP images confirms the above findings. IMPRESSION: 1. Positive for acute PE with CT evidence of right heart strain (RV/LV Ratio = 1.15) consistent with at least submassive (intermediate risk) PE. The presence of right heart strain has been associated with an increased risk of morbidity and mortality. Please activate Code PE by paging [phone]. 2. Extensive bilateral patchy areas of ground-glass attenuation and airspace consolidation which may represent areas of alveolar edema, hemorrhage and/or infection. 3.  Aortic Atherosclerosis (ICD10-I70.0). 4. Critical Value/emergent results were called by telephone at the time of interpretation on 04/30/2018 at 1:21 pm to Dr. Sherwood Gambler , who verbally acknowledged these results. Electronically Signed   By: Kerby Moors M.D.   On: 04/30/2018 13:21   Dg Chest [facility]  1 View  Result Date: 05/03/2018 CLINICAL DATA:  Follow-up pneumonia, PE. EXAM: PORTABLE CHEST 1 VIEW COMPARISON:  CTA chest and chest radiographs dated 04/30/2018 FINDINGS: Multifocal patchy opacities in the bilateral upper lobes, right lower lobe predominant, suspicious for pneumonia. No definite pleural effusions. No pneumothorax. Cardiomegaly. IMPRESSION: Multifocal patchy opacities, right lower lobe predominant, suspicious for pneumonia. When compared to prior chest radiographs, the overall appearance is similar, although the left upper lobe opacity is improved/less focal. Electronically Signed   By: Julian Hy M.D.   On: 05/03/2018 20:10   Dg Chest Port 1 View  Result Date: 04/22/2018 CLINICAL DATA:  Cough and shortness of breath with hypoxia EXAM: PORTABLE CHEST 1 VIEW  COMPARISON:  April 20, 2018 FINDINGS: Patchy infiltrate is seen bilaterally, most notably in the right lower lung zone medially. Heart is upper normal in size with pulmonary vascularity normal. No adenopathy. No bone lesions. IMPRESSION: Patchy airspace opacity, likely multifocal pneumonia. There may be a degree of underlying fibrosis as well. Stable cardiac silhouette. No new opacity. Electronically Signed   By: Lowella Grip III M.D.   On: 04/22/2018 12:29   Dg C-arm 1-60 Min-no Report  Result Date: 04/07/2018 Fluoroscopy was utilized by the requesting physician.  No radiographic interpretation.   Ct Renal Stone Study  Result Date: 04/30/2018 CLINICAL DATA:  Left flank pain. EXAM: CT ABDOMEN AND PELVIS WITHOUT CONTRAST TECHNIQUE: Multidetector CT imaging of the abdomen and pelvis was performed following the standard protocol without IV contrast. COMPARISON:  None. FINDINGS: Lower chest: Patchy areas of ground-glass airspace consolidation with associated interstitial thickening in bilateral lower lobes with peripheral predominance. No pleural effusion or pneumothorax. Enlarged heart. Calcific atherosclerotic disease of the coronary arteries and aorta. Hepatobiliary: No focal liver abnormality is seen. Status post cholecystectomy. No biliary dilatation. Pancreas: Unremarkable. No pancreatic ductal dilatation or surrounding inflammatory changes. Spleen: Normal in size without focal abnormality. Adrenals/Urinary Tract: Normal adrenal glands. Left-sided circumscribed hypoattenuated renal nodules measuring water density likely represent cysts. No evidence of hydronephrosis or nephrolithiasis. Vascular calcifications noted. Stomach/Bowel: Stomach is within normal limits. Post appendectomy. No evidence of bowel wall thickening, distention, or inflammatory changes. Diffuse colonic diverticulosis without evidence of acute diverticulitis. Vascular/Lymphatic: Aortic atherosclerosis. No enlarged abdominal or  pelvic lymph nodes. Reproductive: Status post hysterectomy. No adnexal masses. Other: No abdominal wall hernia or abnormality. No abdominopelvic ascites. Musculoskeletal: Spondylosis of the lumbosacral spine. IMPRESSION: No evidence of nephrolithiasis or hydronephrosis. Diffuse colonic diverticulosis without evidence of acute diverticulitis. Patchy areas of ground-glass attenuation of the lower lobes of the lungs with associated interstitial thickening. Findings may represent atypical pneumonia. Electronically Signed   By: Fidela Salisbury M.D.   On: 04/30/2018 10:27   Dg Hip Unilat  With Pelvis 2-3 Views Right  Result Date: 04/17/2018 CLINICAL DATA:  Worsening right hip pain following right hip surgery in May 2019. EXAM: DG HIP (WITH OR WITHOUT PELVIS) 2-3V RIGHT COMPARISON:  03/29/2018. FINDINGS: Stable right total hip prosthesis. No fracture or dislocation seen. Diffuse osteopenia. IMPRESSION: 1. No acute abnormality. 2. Hardware intact. Electronically Signed   By: Claudie Revering M.D.   On: 04/17/2018 11:59   Vas [place] Lower Extremity Venous (dvt)  Result Date: 05/03/2018  Lower Venous Study Indications: Pulmonary embolism.  Risk Factors: Confirmed PE. Limitations: Body habitus. Comparison Study: CTA on 04/30/2018 Performing Technologist: Lorina Rabon  Examination Guidelines: A complete evaluation includes B-mode imaging, spectral Doppler, color Doppler, and power Doppler as needed of all accessible portions of each vessel. Bilateral testing  is considered an integral part of a complete examination. Limited examinations for reoccurring indications may be performed as noted.  Right Venous Findings: +---------+---------------+---------+-----------+----------+-----------------+          CompressibilityPhasicitySpontaneityPropertiesSummary           +---------+---------------+---------+-----------+----------+-----------------+ CFV      Full           Yes      Yes                                     +---------+---------------+---------+-----------+----------+-----------------+ SFJ      Full                                                           +---------+---------------+---------+-----------+----------+-----------------+ FV Prox  Full                                                           +---------+---------------+---------+-----------+----------+-----------------+ FV Mid   Full                                                           +---------+---------------+---------+-----------+----------+-----------------+ FV DistalFull                                                           +---------+---------------+---------+-----------+----------+-----------------+ PFV      Full                                                           +---------+---------------+---------+-----------+----------+-----------------+ POP      Full           Yes      Yes                                    +---------+---------------+---------+-----------+----------+-----------------+ PTV      None                    No                   Age Indeterminate +---------+---------------+---------+-----------+----------+-----------------+ PERO     None                    No                   Age Indeterminate +---------+---------------+---------+-----------+----------+-----------------+  Left Venous Findings: +---------+---------------+---------+-----------+----------+-------+          CompressibilityPhasicitySpontaneityPropertiesSummary +---------+---------------+---------+-----------+----------+-------+ CFV  Full           Yes      Yes                          +---------+---------------+---------+-----------+----------+-------+ SFJ      Full                                                 +---------+---------------+---------+-----------+----------+-------+ FV Prox  Full                                                  +---------+---------------+---------+-----------+----------+-------+ FV Mid   Full                                                 +---------+---------------+---------+-----------+----------+-------+ FV DistalFull                                                 +---------+---------------+---------+-----------+----------+-------+ PFV      Full                                                 +---------+---------------+---------+-----------+----------+-------+ POP      Full           Yes      Yes                          +---------+---------------+---------+-----------+----------+-------+ PTV      None                                         Acute   +---------+---------------+---------+-----------+----------+-------+ PERO     None                                         Acute   +---------+---------------+---------+-----------+----------+-------+    Summary: Right: Findings consistent with age indeterminate deep vein thrombosis involving the right posterior tibial vein, and right peroneal vein. No cystic structure found in the popliteal fossa. Left: Findings consistent with acute deep vein thrombosis involving the left posterior tibial vein, and left peroneal vein. No cystic structure found in the popliteal fossa.  *See table(s) above for measurements and observations. Electronically signed by Harold Barban MD on 05/03/2018 at 8:21:23 AM.    Final      Subjective: Eager to go home  Discharge Exam: Vitals:   05/04/18 0733 05/04/18 0847  BP: 121/70   Pulse: 72 87  Resp: (!) 23   Temp: 99.2 F (37.3 C)   SpO2: 93%    Vitals:   05/03/18 2303 05/04/18 0458 05/04/18  0867 05/04/18 0847  BP: 123/63 (!) 144/71 121/70   Pulse: 70 72 72 87  Resp: (!) 30 (!) 23 (!) 23   Temp: 99.5 F (37.5 C) 98.8 F (37.1 C) 99.2 F (37.3 C)   TempSrc: Oral Oral Oral   SpO2: 93% 94% 93%   Weight:      Height:        General: Pt is alert, awake, not in acute  distress Cardiovascular: RRR, S1/S2 +, no rubs, no gallops Respiratory: CTA bilaterally, no wheezing, no rhonchi Abdominal: Soft, NT, ND, bowel sounds + Extremities: no edema, no cyanosis   The results of significant diagnostics from this hospitalization (including imaging, microbiology, ancillary and laboratory) are listed below for reference.     Microbiology: No results found for this or any previous visit (from the past 240 hour(s)).   Labs: BNP (last 3 results) No results for input(s): BNP in the last 8760 hours. Basic Metabolic Panel: Recent Labs  Lab 04/30/18 0920 05/01/18 0723 05/04/18 0302  NA 140 143 141  K 3.4* 4.3 4.1  CL 108 109 103  CO2 25 26 31   GLUCOSE 87 89 82  BUN 11 9 7*  CREATININE 1.06* 0.93 0.95  CALCIUM 9.3 9.0 9.1   Liver Function Tests: Recent Labs  Lab 04/30/18 0920  AST 27  ALT 25  ALKPHOS 51  BILITOT 0.5  PROT 6.4*  ALBUMIN 2.2*   Recent Labs  Lab 04/30/18 0920  LIPASE 21   No results for input(s): AMMONIA in the last 168 hours. CBC: Recent Labs  Lab 04/30/18 0920 05/01/18 0723 05/02/18 0311 05/03/18 0337  WBC 8.5 6.5 6.7 5.8  NEUTROABS 6.3  --   --   --   HGB 11.4* 10.6* 11.0* 11.6*  HCT 38.4 35.3* 37.0 38.1  MCV 97.7 98.6 98.7 98.2  PLT 315 280 341 349   Cardiac Enzymes: Recent Labs  Lab 04/30/18 0920  TROPONINI <0.03   BNP: Invalid input(s): POCBNP CBG: No results for input(s): GLUCAP in the last 168 hours. D-Dimer No results for input(s): DDIMER in the last 72 hours. Hgb A1c No results for input(s): HGBA1C in the last 72 hours. Lipid Profile No results for input(s): CHOL, HDL, LDLCALC, TRIG, CHOLHDL, LDLDIRECT in the last 72 hours. Thyroid function studies No results for input(s): TSH, T4TOTAL, T3FREE, THYROIDAB in the last 72 hours.  Invalid input(s): FREET3 Anemia work up No results for input(s): VITAMINB12, FOLATE, FERRITIN, TIBC, IRON, RETICCTPCT in the last 72 hours. Urinalysis    Component  Value Date/Time   COLORURINE STRAW (A) 05/03/2018 2116   APPEARANCEUR CLEAR 05/03/2018 2116   LABSPEC 1.005 05/03/2018 2116   PHURINE 8.0 05/03/2018 2116   GLUCOSEU NEGATIVE 05/03/2018 2116   HGBUR NEGATIVE 05/03/2018 2116   BILIRUBINUR NEGATIVE 05/03/2018 2116   KETONESUR NEGATIVE 05/03/2018 2116   PROTEINUR NEGATIVE 05/03/2018 2116   UROBILINOGEN 0.2 02/20/2015 0605   NITRITE NEGATIVE 05/03/2018 2116   LEUKOCYTESUR MODERATE (A) 05/03/2018 2116   Sepsis Labs Invalid input(s): PROCALCITONIN,  WBC,  LACTICIDVEN Microbiology No results found for this or any previous visit (from the past 240 hour(s)).  Time spent: 30 min  SIGNED:   Marylu Lund, MD  [facility] 05/04/2018, 10:17 AM  If 7PM-7AM, please contact night-coverage

## 2018-05-04 NOTE — Care Management Note (Signed)
Case Management Note Marvetta Gibbons RN, BSN Transitions of Care Unit 4E- RN Case Manager [phone]  Patient Details  Name: [name] MRN: [id number] Date of Birth: 1940/12/14  Subjective/Objective:    Pt admitted with PE/DVT                Action/Plan: PTA pt lived at home with daughter, active with [facility] services - orders have been placed for resumption of services RN/PT- daughter declines SNF placement and prefers to take mom home. Pt has home 02 with [facility].  Call made to [place] with [facility] for resumption of [facility] services. TOC pharmacy to fill pt's Eliquis prior to discharge and deliver to bedside. Per benefits check for Eliquis coverage - pt has zero dollar copay and no deductible.   Expected Discharge Date:  05/04/18               Expected Discharge Plan:  [place]  In-House Referral:     Discharge planning Services  CM Consult, Medication Assistance  Post Acute Care Choice:  Home Health, Resumption of Svcs/PTA Provider Choice offered to:     DME Arranged:    DME Agency:     HH Arranged:  RN, PT [place] Agency:  [facility] (formerly [employer])  Status of Service:  Completed, signed off  If discussed at [employer] of Stay Meetings, dates discussed:    Discharge Disposition: home/home health   Additional Comments:  Dawayne Patricia, RN 05/04/2018, 3:10 PM

## 2018-05-04 NOTE — Care Management (Signed)
#   1.  S/W  BRITTANY @ [facility] # [phone]   ELIQUIS   5 MG BID COVER- YES CO-PAY- ZERO DOLLARS  TIER- NO PRIOR APPROVAL- NO  NO DEDUCTIBLE  PREFERRED PHARMACY : [facility]  SECONDARY INS: MEDICAID  EFF-DATE 01-25-2017 CO-PAY- $ 3.80 FOR EACH PRESCRIPTION

## 2018-05-06 ENCOUNTER — Ambulatory Visit: Payer: Medicare Other | Admitting: Pain Medicine

## 2018-05-13 ENCOUNTER — Encounter: Payer: Self-pay | Admitting: Family Medicine

## 2018-05-13 ENCOUNTER — Ambulatory Visit (INDEPENDENT_AMBULATORY_CARE_PROVIDER_SITE_OTHER): Payer: Medicare Other | Admitting: Family Medicine

## 2018-05-13 VITALS — BP 102/58 | HR 57 | Temp 97.8°F | Ht 62.0 in | Wt 167.0 lb

## 2018-05-13 DIAGNOSIS — I2699 Other pulmonary embolism without acute cor pulmonale: Secondary | ICD-10-CM | POA: Diagnosis not present

## 2018-05-13 DIAGNOSIS — I10 Essential (primary) hypertension: Secondary | ICD-10-CM | POA: Diagnosis not present

## 2018-05-13 DIAGNOSIS — Z7901 Long term (current) use of anticoagulants: Secondary | ICD-10-CM

## 2018-05-13 DIAGNOSIS — S72001S Fracture of unspecified part of neck of right femur, sequela: Secondary | ICD-10-CM

## 2018-05-13 DIAGNOSIS — J449 Chronic obstructive pulmonary disease, unspecified: Secondary | ICD-10-CM

## 2018-05-13 DIAGNOSIS — Z8249 Family history of ischemic heart disease and other diseases of the circulatory system: Secondary | ICD-10-CM

## 2018-05-13 LAB — CBC WITH DIFFERENTIAL/PLATELET
Basophils Absolute: 0.1 10*3/uL (ref 0.0–0.1)
Basophils Relative: 1.3 % (ref 0.0–3.0)
Eosinophils Absolute: 0.9 10*3/uL — ABNORMAL HIGH (ref 0.0–0.7)
Eosinophils Relative: 10 % — ABNORMAL HIGH (ref 0.0–5.0)
HCT: 38.1 % (ref 36.0–46.0)
Hemoglobin: 12.2 g/dL (ref 12.0–15.0)
Lymphocytes Relative: 21.5 % (ref 12.0–46.0)
Lymphs Abs: 2 10*3/uL (ref 0.7–4.0)
MCHC: 32 g/dL (ref 30.0–36.0)
MCV: 94.4 fl (ref 78.0–100.0)
Monocytes Absolute: 1.2 10*3/uL — ABNORMAL HIGH (ref 0.1–1.0)
Monocytes Relative: 12.8 % — ABNORMAL HIGH (ref 3.0–12.0)
Neutro Abs: 5 10*3/uL (ref 1.4–7.7)
Neutrophils Relative %: 54.4 % (ref 43.0–77.0)
Platelets: 298 10*3/uL (ref 150.0–400.0)
RBC: 4.04 Mil/uL (ref 3.87–5.11)
RDW: 14.8 % (ref 11.5–15.5)
WBC: 9.1 10*3/uL (ref 4.0–10.5)

## 2018-05-13 LAB — COMPREHENSIVE METABOLIC PANEL
ALT: 11 U/L (ref 0–35)
AST: 15 U/L (ref 0–37)
Albumin: 3.1 g/dL — ABNORMAL LOW (ref 3.5–5.2)
Alkaline Phosphatase: 46 U/L (ref 39–117)
BUN: 13 mg/dL (ref 6–23)
CO2: 31 mEq/L (ref 19–32)
Calcium: 9.8 mg/dL (ref 8.4–10.5)
Chloride: 104 mEq/L (ref 96–112)
Creatinine, Ser: 1.15 mg/dL (ref 0.40–1.20)
GFR: 48.66 mL/min — ABNORMAL LOW (ref >60.00)
Glucose, Bld: 73 mg/dL (ref 70–99)
Potassium: 4.2 mEq/L (ref 3.5–5.1)
Sodium: 140 mEq/L (ref 135–145)
Total Bilirubin: 0.2 mg/dL (ref 0.2–1.2)
Total Protein: 7.3 g/dL (ref 6.0–8.3)

## 2018-05-13 NOTE — Progress Notes (Signed)
Subjective:    Patient ID: [name], female    DOB: Oct 11, 1940, 77 y.o.   MRN: [id number]  HPI This is a 77 yo female who present today to establish care. She is accompanied by her daughter who is also establishing care. The two of them live together.   Ms. Sellin was recently admitted to the hospital, 04/30/18-04/29/18 with PE with right ventricular strain with BLE DVT. Echo- RV systolic function normal. She was placed on Eliquis prior to discharge. Prior to this admission, she was admitted with pneumonia and respiratory failure. Family history of PE/DVT, no prior workup for hypercoagulable state. Her daughter feels that DVT related to inactivity.   Had hip replacement 09/30/17 (delayed union of closed hip fracture and had right hip IM nail removal with conversion to total hop arthoplasty, bone not growing, using bone stimulator. Has had terrible pain. Has not been able to ambulate very much due to pain. PT has been on hold with recent hospitalization. Doing some leg exercises on her own. Uses a walker. Taking lyrica and meloxicam- instructed to not take meloxicam with Eliquis. No relief with heat/ice/topical pain medication. Has follow up with ortho next month. Does not feel that pain is adequately addressed. Has seen pain management in past for chronic pain.   Chronic respiratory failure/COPD/PE- was discharged home on oxygen via nasal cannula. Has been on 1-2 liters. Patient finds it uncomfortable and drying to mouth. Pulse ox usually runs mid to high 90s, except to high 80s with ambulation/activity.   Discharged home on low dose metoprolol, daughter thinks this is increasing fatigue.   ROS- gets sleepy easily, some SOB, some chest pain yesterday, some cough with yellow phlegm, light nose bleed and small specks of blood in sputum. Right hip pain.    Past Medical History:  Diagnosis Date  . Acute postoperative pain 02/03/2018  . Anemia   . B12 deficiency   . Bacteremia 10/07/2014  .  Bladder incontinence   . Blind left eye   . Cancer ([place])    skin cancer   . Cataract   . Cervical spine fracture ([place])   . Chest pain 07/29/2013  . Compression fracture    C7,  upper T spine -compression fx  . COPD (chronic obstructive pulmonary disease) ([place])   . DDD (degenerative disc disease), lumbar   . Depression    major  . Diffuse myofascial pain syndrome 03/24/2015  . Dyspnea    at times- when activty  . Fever 10/05/2014  . GERD (gastroesophageal reflux disease)   . Hiatal hernia   . Hyperlipidemia   . Hypertension   . 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 Macular degeneration   . On home oxygen therapy    "3L; all the time" (04/30/2018)  . Osteoarthritis   . Osteoporosis   . Pneumonia    years ago  . Pulmonary embolism ([place]) 04/30/2018  . Reactive airway disease   . Rupture of bowel ([place])   . Sepsis ([place]) 10/08/2014  . Stroke [facility])    "mini stroke"- balance and memory issue  . Stroke ([place])   . Wrist pain, acute 09/10/2012   Past Surgical History:  Procedure Laterality Date  . ABDOMINAL HYSTERECTOMY    . ABDOMINAL SURGERY    . APPENDECTOMY    . BLADDER SURGERY    . CATARACT EXTRACTION W/ INTRAOCULAR LENS  IMPLANT, BILATERAL Bilateral   . CHOLECYSTECTOMY    . COLON SURGERY    . COLOSTOMY REVERSAL    .  INTRAMEDULLARY (IM) NAIL INTERTROCHANTERIC Right 09/13/2016   Procedure: INTRAMEDULLARY (IM) NAIL INTERTROCHANTRIC RIGHT;  Surgeon: Leandrew Koyanagi, MD;  Location: [place];  Service: Orthopedics;  Laterality: Right;  . JOINT REPLACEMENT Bilateral   . KNEE SURGERY    . OSTOMY    . RECTOCELE REPAIR    . TOE SURGERY Right    3rd  . TOTAL HIP ARTHROPLASTY Right 09/30/2017   Procedure: RIGHT HIP IM NAIL REMOVAL WITH CONVERSION TO TOTAL HIP ARTHOPLASTY, anterior approach;  Surgeon: Renette Butters, MD;  Location: [facility];  Service: Orthopedics;  Laterality: Right;   Family History  Problem Relation Age of Onset  . Heart failure Mother   . Heart attack  Father    Social History   Tobacco Use  . Smoking status: Former Smoker    Packs/day: 1.50    Years: 30.00    Pack years: 45.00    Types: E-cigarettes, Cigarettes    Last attempt to quit: 2013    Years since quitting: 6.9  . Smokeless tobacco: Never Used  Substance Use Topics  . Alcohol use: No    Alcohol/week: 0.0 standard drinks  . Drug use: No      Review of Systems Per HPI    Objective:   Physical Exam Vitals signs reviewed.  Constitutional:      Comments: Sitting in wheelchair. Frail appearing. Appears stated age. Interactive when addressed and interjects in conversation but frequently closes eyes.   HENT:     Head: Normocephalic and atraumatic.     Mouth/Throat:     Mouth: Mucous membranes are dry.  Eyes:     Pupils: Pupils are equal, round, and reactive to light.  Cardiovascular:     Rate and Rhythm: Normal rate.     Heart sounds: Normal heart sounds.  Pulmonary:     Effort: Pulmonary effort is normal.     Breath sounds: Normal breath sounds.  Skin:    General: Skin is warm and dry.  Neurological:     Comments: Answers questions appropriately.   Psychiatric:        Mood and Affect: Mood normal.        Behavior: Behavior normal.       BP (!) 102/58 (BP Location: Right Arm, Patient Position: Sitting, Cuff Size: Normal)   Pulse (!) 57   Temp 97.8 F (36.6 C) (Oral)   Ht 5\' 2"  (1.575 m)   Wt 167 lb (75.8 kg)   SpO2 97%   BMI 30.54 kg/m  Wt Readings from Last 3 Encounters:  05/13/18 167 lb (75.8 kg)  05/01/18 179 lb 14.3 oz (81.6 kg)  04/20/18 180 lb 0.1 oz (81.7 kg)       Assessment & Plan:  1. Current use of long term anticoagulation - instructed patient and her daughter to avoid all NSAIDs - CBC with Differential - Comprehensive metabolic panel  2. Essential hypertension - BP on low side today, will wean metoprolol, can't tell by review of records why this was started, will continue amlodipine - Comprehensive metabolic panel  3. PE  (pulmonary thromboembolism) ([place]) - continue Eliquis - Will refer to hematology for additional work up and to determine length of treatment - Ambulatory referral to Hematology  4. Family history of pulmonary embolism - Ambulatory referral to Hematology  5. Closed hip fracture, sequela ([place]) - Discussed pain with patient and her daughter, I will not provide any narcotic pain medication as she is a high fall risk, discussed acetaminophen dosage,  topical treatment, heat/ice, encouraged increased ambulation and resumption of PT.  - Follow up with ortho as scheduled  6. CAFL (chronic airflow limitation) (HCC) - discussed oxygen use and encouraged her to wear with ambulation or sleep and to keep pulse ox 92 or greater  - follow up in 2 months   Clarene Reamer, FNP-BC  [facility], [place]  05/15/2018 8:35 AM

## 2018-05-13 NOTE — Patient Instructions (Addendum)
Can take Extra strength Tylenol up to 4,000 mg a day  Please follow up in 6-8 weeks  Use oxygen 1-3 liters to keep pulse ox at 92 or above  While on Eliquis- no meloxicam, ibuprofen or naprosyn  You will get a call about hematology referral to see about PE and how long to be on Eliquis  Activity as tolerated

## 2018-05-15 ENCOUNTER — Other Ambulatory Visit: Payer: Self-pay | Admitting: Family Medicine

## 2018-05-15 ENCOUNTER — Telehealth: Payer: Self-pay | Admitting: Family Medicine

## 2018-05-15 ENCOUNTER — Telehealth: Payer: Self-pay

## 2018-05-15 DIAGNOSIS — M7989 Other specified soft tissue disorders: Secondary | ICD-10-CM

## 2018-05-15 MED ORDER — MEDICAL COMPRESSION SOCKS MISC: 1.0000 | Freq: Every day | 0 refills | Status: DC | PRN

## 2018-05-15 MED ORDER — ALPRAZOLAM 1 MG PO TABS: 1.0000 mg | ORAL_TABLET | Freq: Every evening | ORAL | 1 refills | Status: DC | PRN

## 2018-05-15 NOTE — Telephone Encounter (Signed)
Pt's [name] (DPR signed) request cb about lab results from 05/13/18.

## 2018-05-15 NOTE — Telephone Encounter (Signed)
Please call patient's daughter and let her know that I have reviewed her mother's chart and I can not find a reason that she needs to stay on the metoprolol. It needs to be weaned, she should take 1/2 tablet twice a day for a week, then can stop.

## 2018-05-15 NOTE — Telephone Encounter (Signed)
Historical medication  Called and spoke with patient daughter the last time patient had this medication filled was  04/08/18 #30. Patient takes daily at bed time as needed. Patient currently has 31/2 pills left.

## 2018-05-15 NOTE — Telephone Encounter (Signed)
Pts [name] (on dpr) called to get the Alprazolam refilled and to see if a prescription can be sent in for compression socks. Pt uses [facility], [place].

## 2018-05-15 NOTE — Telephone Encounter (Signed)
Results sent through result note.

## 2018-05-15 NOTE — Telephone Encounter (Signed)
Alprazolam prescription sent to patient's pharmacy. Order for compression socks printed and patient can pick up or it can be faxed to medical supply company of her choice.

## 2018-05-15 NOTE — Progress Notes (Signed)
Provided refill of alprazolam 1 mg po qhs prn #30 with 1 refill. Reviewed [facility] and patient has been getting filled monthly chronically. Will discuss weaning at next OV.

## 2018-05-17 ENCOUNTER — Inpatient Hospital Stay
Admission: EM | Admit: 2018-05-17 | Discharge: 2018-05-18 | DRG: 871 | Disposition: A | Discharge status: Home Health | Payer: Medicare Other | Attending: Internal Medicine | Admitting: Internal Medicine

## 2018-05-17 ENCOUNTER — Emergency Department: Payer: Medicare Other

## 2018-05-17 ENCOUNTER — Other Ambulatory Visit: Payer: Self-pay

## 2018-05-17 ENCOUNTER — Inpatient Hospital Stay: Payer: Medicare Other

## 2018-05-17 DIAGNOSIS — J1000 Influenza due to other identified influenza virus with unspecified type of pneumonia: Secondary | ICD-10-CM | POA: Diagnosis present

## 2018-05-17 DIAGNOSIS — A419 Sepsis, unspecified organism: Principal | ICD-10-CM

## 2018-05-17 DIAGNOSIS — Z8701 Personal history of pneumonia (recurrent): Secondary | ICD-10-CM

## 2018-05-17 DIAGNOSIS — M810 Age-related osteoporosis without current pathological fracture: Secondary | ICD-10-CM | POA: Diagnosis present

## 2018-05-17 DIAGNOSIS — J440 Chronic obstructive pulmonary disease with acute lower respiratory infection: Secondary | ICD-10-CM | POA: Diagnosis not present

## 2018-05-17 DIAGNOSIS — F329 Major depressive disorder, single episode, unspecified: Secondary | ICD-10-CM | POA: Diagnosis present

## 2018-05-17 DIAGNOSIS — Z8673 Personal history of transient ischemic attack (TIA), and cerebral infarction without residual deficits: Secondary | ICD-10-CM | POA: Diagnosis not present

## 2018-05-17 DIAGNOSIS — Z7983 Long term (current) use of bisphosphonates: Secondary | ICD-10-CM

## 2018-05-17 DIAGNOSIS — Z9842 Cataract extraction status, left eye: Secondary | ICD-10-CM | POA: Diagnosis not present

## 2018-05-17 DIAGNOSIS — Z86711 Personal history of pulmonary embolism: Secondary | ICD-10-CM

## 2018-05-17 DIAGNOSIS — M1990 Unspecified osteoarthritis, unspecified site: Secondary | ICD-10-CM | POA: Diagnosis not present

## 2018-05-17 DIAGNOSIS — Z91048 Other nonmedicinal substance allergy status: Secondary | ICD-10-CM

## 2018-05-17 DIAGNOSIS — Z961 Presence of intraocular lens: Secondary | ICD-10-CM | POA: Diagnosis present

## 2018-05-17 DIAGNOSIS — R0781 Pleurodynia: Secondary | ICD-10-CM

## 2018-05-17 DIAGNOSIS — Z79899 Other long term (current) drug therapy: Secondary | ICD-10-CM | POA: Diagnosis not present

## 2018-05-17 DIAGNOSIS — E785 Hyperlipidemia, unspecified: Secondary | ICD-10-CM | POA: Diagnosis present

## 2018-05-17 DIAGNOSIS — M5136 Other intervertebral disc degeneration, lumbar region: Secondary | ICD-10-CM | POA: Diagnosis present

## 2018-05-17 DIAGNOSIS — Z9981 Dependence on supplemental oxygen: Secondary | ICD-10-CM | POA: Diagnosis not present

## 2018-05-17 DIAGNOSIS — J441 Chronic obstructive pulmonary disease with (acute) exacerbation: Secondary | ICD-10-CM | POA: Diagnosis not present

## 2018-05-17 DIAGNOSIS — K219 Gastro-esophageal reflux disease without esophagitis: Secondary | ICD-10-CM | POA: Diagnosis present

## 2018-05-17 DIAGNOSIS — Z96653 Presence of artificial knee joint, bilateral: Secondary | ICD-10-CM | POA: Diagnosis not present

## 2018-05-17 DIAGNOSIS — N183 Chronic kidney disease, stage 3 (moderate): Secondary | ICD-10-CM | POA: Diagnosis not present

## 2018-05-17 DIAGNOSIS — Z87891 Personal history of nicotine dependence: Secondary | ICD-10-CM

## 2018-05-17 DIAGNOSIS — Z9841 Cataract extraction status, right eye: Secondary | ICD-10-CM | POA: Diagnosis not present

## 2018-05-17 DIAGNOSIS — J9611 Chronic respiratory failure with hypoxia: Secondary | ICD-10-CM | POA: Diagnosis present

## 2018-05-17 DIAGNOSIS — H3530 Unspecified macular degeneration: Secondary | ICD-10-CM | POA: Diagnosis not present

## 2018-05-17 DIAGNOSIS — Z8249 Family history of ischemic heart disease and other diseases of the circulatory system: Secondary | ICD-10-CM

## 2018-05-17 DIAGNOSIS — Z85828 Personal history of other malignant neoplasm of skin: Secondary | ICD-10-CM | POA: Diagnosis not present

## 2018-05-17 DIAGNOSIS — I129 Hypertensive chronic kidney disease with stage 1 through stage 4 chronic kidney disease, or unspecified chronic kidney disease: Secondary | ICD-10-CM | POA: Diagnosis present

## 2018-05-17 DIAGNOSIS — J101 Influenza due to other identified influenza virus with other respiratory manifestations: Secondary | ICD-10-CM

## 2018-05-17 DIAGNOSIS — Z7901 Long term (current) use of anticoagulants: Secondary | ICD-10-CM

## 2018-05-17 DIAGNOSIS — G894 Chronic pain syndrome: Secondary | ICD-10-CM | POA: Diagnosis not present

## 2018-05-17 LAB — INFLUENZA PANEL BY PCR (TYPE A & B)
Influenza A By PCR: POSITIVE — AB
Influenza B By PCR: NEGATIVE

## 2018-05-17 LAB — COMPREHENSIVE METABOLIC PANEL
ALT: 12 U/L (ref 0–44)
AST: 26 U/L (ref 15–41)
Albumin: 3 g/dL — ABNORMAL LOW (ref 3.5–5.0)
Alkaline Phosphatase: 51 U/L (ref 38–126)
Anion gap: 9 (ref 5–15)
BUN: 7 mg/dL — ABNORMAL LOW (ref 8–23)
CO2: 24 mmol/L (ref 22–32)
Calcium: 8.9 mg/dL (ref 8.9–10.3)
Chloride: 106 mmol/L (ref 98–111)
Creatinine, Ser: 0.94 mg/dL (ref 0.44–1.00)
GFR calc Af Amer: >60 mL/min (ref >60)
GFR calc non Af Amer: 59 mL/min — ABNORMAL LOW (ref >60)
Glucose, Bld: 65 mg/dL — ABNORMAL LOW (ref 70–99)
Potassium: 3.6 mmol/L (ref 3.5–5.1)
Sodium: 139 mmol/L (ref 135–145)
Total Bilirubin: 0.6 mg/dL (ref 0.3–1.2)
Total Protein: 7.2 g/dL (ref 6.5–8.1)

## 2018-05-17 LAB — CBC WITH DIFFERENTIAL/PLATELET
Abs Immature Granulocytes: 0.04 10*3/uL (ref 0.00–0.07)
Basophils Absolute: 0.1 10*3/uL (ref 0.0–0.1)
Basophils Relative: 1 %
Eosinophils Absolute: 0.7 10*3/uL — ABNORMAL HIGH (ref 0.0–0.5)
Eosinophils Relative: 6 %
HCT: 41.1 % (ref 36.0–46.0)
Hemoglobin: 12.5 g/dL (ref 12.0–15.0)
Immature Granulocytes: 0 %
Lymphocytes Relative: 9 %
Lymphs Abs: 0.9 10*3/uL (ref 0.7–4.0)
MCH: 29.6 pg (ref 26.0–34.0)
MCHC: 30.4 g/dL (ref 30.0–36.0)
MCV: 97.4 fL (ref 80.0–100.0)
Monocytes Absolute: 1 10*3/uL (ref 0.1–1.0)
Monocytes Relative: 9 %
Neutro Abs: 8.1 10*3/uL — ABNORMAL HIGH (ref 1.7–7.7)
Neutrophils Relative %: 75 %
Platelets: 229 10*3/uL (ref 150–400)
RBC: 4.22 MIL/uL (ref 3.87–5.11)
RDW: 13.9 % (ref 11.5–15.5)
WBC: 10.7 10*3/uL — ABNORMAL HIGH (ref 4.0–10.5)
nRBC: 0 % (ref 0.0–0.2)

## 2018-05-17 LAB — LACTIC ACID, PLASMA: Lactic Acid, Venous: 3.2 mmol/L (ref 0.5–1.9)

## 2018-05-17 LAB — C DIFFICILE QUICK SCREEN W PCR REFLEX
C Diff antigen: NEGATIVE
C Diff interpretation: NOT DETECTED
C Diff toxin: NEGATIVE

## 2018-05-17 LAB — TROPONIN I: Troponin I: <0.03 ng/mL (ref <0.03)

## 2018-05-17 LAB — CG4 I-STAT (LACTIC ACID): Lactic Acid, Venous: 2.9 mmol/L (ref 0.5–1.9)

## 2018-05-17 LAB — MRSA PCR SCREENING: MRSA by PCR: POSITIVE — AB

## 2018-05-17 LAB — PROTIME-INR
INR: 1.21
Prothrombin Time: 15.2 seconds (ref 11.4–15.2)

## 2018-05-17 LAB — APTT: aPTT: 30 seconds (ref 24–36)

## 2018-05-17 MED ORDER — IPRATROPIUM-ALBUTEROL 0.5-2.5 (3) MG/3ML IN SOLN
3.0000 mL | Freq: Once | RESPIRATORY_TRACT | Status: AC
  Administered 2018-05-17: 3 mL via RESPIRATORY_TRACT
  Filled 2018-05-17: qty 3

## 2018-05-17 MED ORDER — BUDESONIDE 0.25 MG/2ML IN SUSP
0.2500 mg | Freq: Two times a day (BID) | RESPIRATORY_TRACT | Status: DC
  Administered 2018-05-17 – 2018-05-18 (×2): 0.25 mg via RESPIRATORY_TRACT
  Filled 2018-05-17 (×2): qty 2

## 2018-05-17 MED ORDER — ALENDRONATE SODIUM 70 MG PO TABS: 70.0000 mg | ORAL_TABLET | ORAL | Status: DC

## 2018-05-17 MED ORDER — DOCUSATE SODIUM 100 MG PO CAPS: 200.0000 mg | ORAL_CAPSULE | Freq: Two times a day (BID) | ORAL | Status: DC | PRN

## 2018-05-17 MED ORDER — LEVOFLOXACIN IN D5W 750 MG/150ML IV SOLN: 750.0000 mg | INTRAVENOUS | Status: DC

## 2018-05-17 MED ORDER — TRAZODONE HCL 100 MG PO TABS
100.0000 mg | ORAL_TABLET | Freq: Every day | ORAL | Status: DC
  Administered 2018-05-17: 100 mg via ORAL
  Filled 2018-05-17: qty 1

## 2018-05-17 MED ORDER — AZITHROMYCIN 250 MG PO TABS
500.0000 mg | ORAL_TABLET | Freq: Every day | ORAL | Status: AC
  Administered 2018-05-18: 500 mg via ORAL
  Filled 2018-05-17: qty 2

## 2018-05-17 MED ORDER — IPRATROPIUM-ALBUTEROL 0.5-2.5 (3) MG/3ML IN SOLN
3.0000 mL | Freq: Four times a day (QID) | RESPIRATORY_TRACT | Status: DC
  Administered 2018-05-17 – 2018-05-18 (×3): 3 mL via RESPIRATORY_TRACT
  Filled 2018-05-17 (×3): qty 3

## 2018-05-17 MED ORDER — LINACLOTIDE 290 MCG PO CAPS
290.0000 ug | ORAL_CAPSULE | Freq: Every day | ORAL | Status: DC
  Administered 2018-05-17 – 2018-05-18 (×2): 290 ug via ORAL
  Filled 2018-05-17 (×2): qty 1

## 2018-05-17 MED ORDER — GUAIFENESIN ER 600 MG PO TB12: 600.0000 mg | ORAL_TABLET | Freq: Two times a day (BID) | ORAL | Status: DC | PRN

## 2018-05-17 MED ORDER — SODIUM CHLORIDE 0.9 % IV SOLN
INTRAVENOUS | Status: DC
  Administered 2018-05-17 – 2018-05-18 (×2): via INTRAVENOUS

## 2018-05-17 MED ORDER — PHENOL 1.4 % MT LIQD
1.0000 | OROMUCOSAL | Status: DC | PRN
  Administered 2018-05-17: 1 via OROMUCOSAL
  Filled 2018-05-17: qty 177

## 2018-05-17 MED ORDER — OSELTAMIVIR PHOSPHATE 75 MG PO CAPS
75.0000 mg | ORAL_CAPSULE | Freq: Two times a day (BID) | ORAL | Status: DC
  Administered 2018-05-17 – 2018-05-18 (×2): 75 mg via ORAL
  Filled 2018-05-17 (×3): qty 1

## 2018-05-17 MED ORDER — AMLODIPINE BESYLATE 5 MG PO TABS
5.0000 mg | ORAL_TABLET | Freq: Every day | ORAL | Status: DC
  Administered 2018-05-17 – 2018-05-18 (×2): 5 mg via ORAL
  Filled 2018-05-17 (×2): qty 1

## 2018-05-17 MED ORDER — METHYLPREDNISOLONE SODIUM SUCC 40 MG IJ SOLR
40.0000 mg | Freq: Three times a day (TID) | INTRAMUSCULAR | Status: DC
  Administered 2018-05-17 – 2018-05-18 (×2): 40 mg via INTRAVENOUS
  Filled 2018-05-17 (×2): qty 1

## 2018-05-17 MED ORDER — SODIUM CHLORIDE 0.9 % IV BOLUS
500.0000 mL | Freq: Once | INTRAVENOUS | Status: AC
  Administered 2018-05-17: 500 mL via INTRAVENOUS

## 2018-05-17 MED ORDER — VITAMIN D3 25 MCG (1000 UNIT) PO TABS
5000.0000 [IU] | ORAL_TABLET | Freq: Every day | ORAL | Status: DC
  Administered 2018-05-17 – 2018-05-18 (×2): 5000 [IU] via ORAL
  Filled 2018-05-17 (×2): qty 5

## 2018-05-17 MED ORDER — ACETAMINOPHEN 325 MG PO TABS: 650.0000 mg | ORAL_TABLET | Freq: Four times a day (QID) | ORAL | Status: DC | PRN

## 2018-05-17 MED ORDER — PRAVASTATIN SODIUM 20 MG PO TABS
20.0000 mg | ORAL_TABLET | Freq: Every day | ORAL | Status: DC
  Administered 2018-05-17: 20 mg via ORAL
  Filled 2018-05-17: qty 1

## 2018-05-17 MED ORDER — ONDANSETRON 4 MG PO TBDP
4.0000 mg | ORAL_TABLET | Freq: Three times a day (TID) | ORAL | Status: DC | PRN
  Filled 2018-05-17: qty 1

## 2018-05-17 MED ORDER — METHYLPREDNISOLONE SODIUM SUCC 125 MG IJ SOLR
80.0000 mg | INTRAMUSCULAR | Status: AC
  Administered 2018-05-17: 80 mg via INTRAVENOUS
  Filled 2018-05-17: qty 2

## 2018-05-17 MED ORDER — PANTOPRAZOLE SODIUM 40 MG PO TBEC
40.0000 mg | DELAYED_RELEASE_TABLET | Freq: Every day | ORAL | Status: DC
  Administered 2018-05-17 – 2018-05-18 (×2): 40 mg via ORAL
  Filled 2018-05-17 (×2): qty 1

## 2018-05-17 MED ORDER — CHLORHEXIDINE GLUCONATE CLOTH 2 % EX PADS
6.0000 | MEDICATED_PAD | Freq: Every day | CUTANEOUS | Status: DC
  Administered 2018-05-18: 6 via TOPICAL

## 2018-05-17 MED ORDER — APIXABAN 5 MG PO TABS
5.0000 mg | ORAL_TABLET | Freq: Two times a day (BID) | ORAL | Status: DC
  Administered 2018-05-17 – 2018-05-18 (×2): 5 mg via ORAL
  Filled 2018-05-17 (×3): qty 1

## 2018-05-17 MED ORDER — LEVOFLOXACIN IN D5W 750 MG/150ML IV SOLN
750.0000 mg | Freq: Once | INTRAVENOUS | Status: AC
  Administered 2018-05-17: 750 mg via INTRAVENOUS
  Filled 2018-05-17: qty 150

## 2018-05-17 MED ORDER — OSELTAMIVIR PHOSPHATE 30 MG PO CAPS
30.0000 mg | ORAL_CAPSULE | Freq: Once | ORAL | Status: AC
  Administered 2018-05-17: 30 mg via ORAL
  Filled 2018-05-17: qty 1

## 2018-05-17 MED ORDER — MUPIROCIN 2 % EX OINT
1.0000 "application " | TOPICAL_OINTMENT | Freq: Two times a day (BID) | CUTANEOUS | Status: DC
  Administered 2018-05-17 – 2018-05-18 (×2): 1 via NASAL
  Filled 2018-05-17: qty 22

## 2018-05-17 MED ORDER — ALPRAZOLAM 1 MG PO TABS
1.0000 mg | ORAL_TABLET | Freq: Every evening | ORAL | Status: DC | PRN
  Administered 2018-05-17: 1 mg via ORAL
  Filled 2018-05-17: qty 1

## 2018-05-17 MED ORDER — CITALOPRAM HYDROBROMIDE 20 MG PO TABS
20.0000 mg | ORAL_TABLET | Freq: Every day | ORAL | Status: DC
  Administered 2018-05-17 – 2018-05-18 (×2): 20 mg via ORAL
  Filled 2018-05-17 (×2): qty 1

## 2018-05-17 MED ORDER — TRAMADOL HCL 50 MG PO TABS
50.0000 mg | ORAL_TABLET | Freq: Four times a day (QID) | ORAL | Status: DC | PRN
  Administered 2018-05-17: 50 mg via ORAL
  Filled 2018-05-17: qty 1

## 2018-05-17 MED ORDER — PREGABALIN 75 MG PO CAPS
75.0000 mg | ORAL_CAPSULE | Freq: Two times a day (BID) | ORAL | Status: DC
  Administered 2018-05-17 – 2018-05-18 (×2): 75 mg via ORAL
  Filled 2018-05-17 (×2): qty 1

## 2018-05-17 MED ORDER — POLYETHYLENE GLYCOL 3350 17 G PO PACK: 17.0000 g | PACK | Freq: Every day | ORAL | Status: DC | PRN

## 2018-05-17 MED ORDER — ALBUTEROL SULFATE (2.5 MG/3ML) 0.083% IN NEBU: 2.5000 mg | INHALATION_SOLUTION | RESPIRATORY_TRACT | Status: DC | PRN

## 2018-05-17 MED ORDER — ACETAMINOPHEN 500 MG PO TABS
1000.0000 mg | ORAL_TABLET | Freq: Once | ORAL | Status: AC
  Administered 2018-05-17: 1000 mg via ORAL
  Filled 2018-05-17: qty 2

## 2018-05-17 MED ORDER — IOPAMIDOL (ISOVUE-370) INJECTION 76%
75.0000 mL | Freq: Once | INTRAVENOUS | Status: AC | PRN
  Administered 2018-05-17: 75 mL via INTRAVENOUS

## 2018-05-17 MED ORDER — CALCIUM CARBONATE-VITAMIN D 500-200 MG-UNIT PO TABS
1.0000 | ORAL_TABLET | Freq: Every day | ORAL | Status: DC
  Administered 2018-05-17 – 2018-05-18 (×2): 1 via ORAL
  Filled 2018-05-17 (×2): qty 1

## 2018-05-17 MED ORDER — BENZONATATE 100 MG PO CAPS: 200.0000 mg | ORAL_CAPSULE | Freq: Two times a day (BID) | ORAL | Status: DC | PRN

## 2018-05-17 MED ORDER — AZITHROMYCIN 250 MG PO TABS: 250.0000 mg | ORAL_TABLET | Freq: Every day | ORAL | Status: DC

## 2018-05-17 NOTE — ED Provider Notes (Signed)
[facility] Emergency Department Provider Note   ____________________________________________   None    (approximate)  I have reviewed the triage vital signs and the nursing notes.   HISTORY  Chief Complaint Shortness of Breath and Chest Pain  EM caveat: None patient reports that she cannot remember many elements of the history.  She does report that her daughter knows her medical history better.  HPI [name] is a 77 y.o. female here for evaluation, reports that this morning she started having a sharp pain in the middle of her chest and shortness of breath.  However she has been coughing more and feeling short of breath for about 3 days.  Sharp discomfort she describes a "needling" sensation in the lower chest.  Some shortness of breath.  A little bit of wheezing.  No fevers, but having chills.  No abdominal pain or vomiting.  Reports a recent diagnosis of a blood clot in her lungs, her daughter gives her medications and she is not certain what blood thinner she takes if or if she is taking it but thinks she is.     Past Medical History:  Diagnosis Date  . Acute postoperative pain 02/03/2018  . Anemia   . B12 deficiency   . Bacteremia 10/07/2014  . Bladder incontinence   . Blind left eye   . Cancer ([place])    skin cancer   . Cataract   . Cervical spine fracture ([place])   . Chest pain 07/29/2013  . Compression fracture    C7,  upper T spine -compression fx  . COPD (chronic obstructive pulmonary disease) ([place])   . DDD (degenerative disc disease), lumbar   . Depression    major  . Diffuse myofascial pain syndrome 03/24/2015  . Dyspnea    at times- when activty  . Fever 10/05/2014  . GERD (gastroesophageal reflux disease)   . Hiatal hernia   . Hyperlipidemia   . Hypertension   . 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 Macular degeneration   . On home oxygen therapy    "3L; all the time" (04/30/2018)  . Osteoarthritis   .  Osteoporosis   . Pneumonia    years ago  . Pulmonary embolism ([place]) 04/30/2018  . Reactive airway disease   . Rupture of bowel ([place])   . Sepsis ([place]) 10/08/2014  . Stroke [facility])    "mini stroke"- balance and memory issue  . Stroke ([place])   . Wrist pain, acute 09/10/2012    Patient Active Problem List   Diagnosis Date Noted  . COPD with acute exacerbation ([place]) 05/17/2018  . Pressure injury of skin 05/04/2018  . PE (pulmonary thromboembolism) ([place]) 04/30/2018  . Acute metabolic encephalopathy   . Respiratory failure with hypoxia ([place]) 04/20/2018  . CAP (community acquired pneumonia) 04/20/2018  . Acute lower UTI 04/20/2018  . Chronic lower extremity pain (Right) 04/07/2018  . Abnormal MRI, lumbar spine 04/07/2018  . Lumbar facet hypertrophy (Multilevel) (Bilateral) 04/07/2018  . Lumbar foraminal stenosis (L3-4, L4-5) (Bilateral) 04/07/2018  . Annular tear of lumbar disc (L3-4, L4-5) 04/07/2018  . Acute right hip pain 02/26/2018  . Closed hip fracture, sequela (Right) 02/03/2018  . It band syndrome, right 12/18/2017  . History of hip replacement (Right) 12/18/2017  . Ileus ([place]) 10/27/2017  . Primary osteoarthritis of hip 09/30/2017  . History of small bowel obstruction 09/10/2017  . 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 SBO (small bowel obstruction) ([place]) 08/24/2017  .  Abnormal x-ray of pelvis 08/13/2017  . Other specified dorsopathies, sacral and sacrococcygeal region 08/04/2017  . Spondylosis without myelopathy or radiculopathy, lumbosacral region 07/17/2017  . Non-traumatic compression fracture of T3 thoracic vertebra, sequela 07/02/2017  . High risk medication use 07/02/2017  . At high risk for falls 07/02/2017  . Chronic sacroiliac joint pain (Right) 07/02/2017  . CKD (chronic kidney disease) stage 3, GFR 30-59 ml/min (HCC) 04/23/2017  . Chronic knee pain s/p total knee replacement (TKR) (Right) 04/02/2017  . DDD (degenerative disc disease), lumbar  03/05/2017  . Chronic knee pain (Bilateral) (R>L) 03/05/2017  . Osteoarthritis 03/05/2017  . Chronic musculoskeletal pain 03/05/2017  . Neurogenic pain 03/05/2017  . Chronic myofascial pain 02/12/2017  . Lumbar facet syndrome (Bilateral) (L>R) 02/12/2017  . Long term prescription benzodiazepine use 02/12/2017  . Opiate use 02/12/2017  . Disorder of skeletal system 02/12/2017  . Pharmacologic therapy 02/12/2017  . Problems influencing health status 02/12/2017  . 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 NSAID long-term use 02/12/2017  . DDD (degenerative disc disease), cervical 11/11/2016  . Lumbar facet arthropathy (Bilateral) 11/11/2016  . 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 CAFL (chronic airflow limitation) ([place]) 11/07/2016  . Depression, major, in partial remission ([place]) 11/07/2016  . 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 Bed wetting 11/07/2016  . Long term current use of opiate analgesic 11/07/2016  . Long term prescription opiate use 11/07/2016  . Chronic pain syndrome 11/07/2016  . Chronic neck pain (Secondary Area of Pain) (Bilateral) (L>R) 11/07/2016  . Chronic hip pain (Right) 11/07/2016  . Rib pain 11/07/2016  . Age-related osteoporosis with current pathological fracture with routine healing 09/20/2016  . History of stroke 09/20/2016  . Major depression in remission ([place]) 09/20/2016  . Intertrochanteric fracture of femur, sequela (Right) 09/13/2016  . Fall   . Chronic shoulder pain (Bilateral) 08/12/2016  . Hx of total knee replacement (Bilateral) 04/04/2016  . Chronic shoulder pain (Left) 02/28/2016  . Hoarseness 01/12/2016  . Pharyngoesophageal dysphagia 01/12/2016  . Chronic low back pain [facility] Area of Pain) (Bilateral) (L>R) 10/26/2015  . 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 UTI (urinary tract infection) 06/25/2015  .  Syncope, non cardiac 05/22/2015  . Leukopenia 10/07/2014  . Thrombocytopenia ([place]) 10/07/2014  . Hypoxia 10/05/2014  . Anxiety 06/15/2014  . Apoplectic vertigo 06/15/2014  . Duodenogastric reflux 06/15/2014  . Benign essential HTN 06/15/2014  . Infection of the upper respiratory tract 06/15/2014  . Hypercholesteremia 07/30/2013  . HTN (hypertension) 07/30/2013  . GERD (gastroesophageal reflux disease) 07/30/2013  . Dizziness and giddiness 06/18/2013  . Memory loss 05/03/2013  . Chronic knee pain (Primary Area of Pain) (Right) 09/10/2012    Past Surgical History:  Procedure Laterality Date  . ABDOMINAL HYSTERECTOMY    . ABDOMINAL SURGERY    . APPENDECTOMY    . BLADDER SURGERY    . CATARACT EXTRACTION W/ INTRAOCULAR LENS  IMPLANT, BILATERAL Bilateral   . CHOLECYSTECTOMY    . COLON SURGERY    . COLOSTOMY REVERSAL    . INTRAMEDULLARY (IM) NAIL INTERTROCHANTERIC Right 09/13/2016   Procedure: INTRAMEDULLARY (IM) NAIL INTERTROCHANTRIC RIGHT;  Surgeon: Leandrew Koyanagi, MD;  Location: [facility];  Service: Orthopedics;  Laterality: Right;  . JOINT REPLACEMENT Bilateral   . KNEE SURGERY    . OSTOMY    . RECTOCELE REPAIR    . TOE SURGERY Right    3rd  .  TOTAL HIP ARTHROPLASTY Right 09/30/2017   Procedure: RIGHT HIP IM NAIL REMOVAL WITH CONVERSION TO TOTAL HIP ARTHOPLASTY, anterior approach;  Surgeon: Renette Butters, MD;  Location: [place];  Service: Orthopedics;  Laterality: Right;    Prior to Admission medications   Medication Sig Start Date End Date Taking? Authorizing Provider  albuterol (ACCUNEB) 1.25 MG/3ML nebulizer solution Take 1 ampule by nebulization every 6 (six) hours as needed for wheezing.   Yes [provider]  albuterol (PROVENTIL HFA;VENTOLIN HFA) 108 (90 BASE) MCG/ACT inhaler Inhale 2 puffs into the lungs every 6 (six) hours as needed for wheezing or shortness of breath.    Yes [provider]  alendronate (FOSAMAX) 70 MG tablet Take 70 mg by mouth every  Monday. Take with a full glass of water on an empty stomach.   Yes [provider]  ALPRAZolam Duanne Moron) 1 MG tablet Take 1 tablet (1 mg total) by mouth at bedtime as needed for sleep. 05/15/18  Yes [name], FNP  amLODipine (NORVASC) 5 MG tablet Take 5 mg by mouth daily.  04/06/13  Yes [provider]  apixaban (ELIQUIS) 5 MG TABS tablet Take 1 tablet (5 mg total) by mouth 2 (two) times daily. Start after 10mg  twice day dosing Patient taking differently: Take 5 mg by mouth 2 (two) times daily.  05/09/18 06/08/18 Yes Donne Hazel, MD  benzonatate (TESSALON) 200 MG capsule Take 1 capsule (200 mg total) by mouth 2 (two) times daily as needed for cough. 05/04/18  Yes Donne Hazel, MD  Calcium 500-100 MG-UNIT CHEW Chew 1 tablet by mouth daily.   Yes [provider]  Cholecalciferol (VITAMIN D HIGH POTENCY PO) Take 5,000 Units by mouth daily.   Yes [provider]  citalopram (CELEXA) 20 MG tablet Take 20 mg by mouth daily.  02/27/17  Yes [provider]  docusate sodium (COLACE) 100 MG capsule Take 200 mg by mouth 2 (two) times daily as needed for moderate constipation.    Yes [provider]  Elastic Bandages & Supports (MEDICAL COMPRESSION SOCKS) MISC 1 each by Does not apply route daily as needed. 05/15/18  Yes [name], FNP  esomeprazole (NEXIUM) 40 MG capsule Take 40 mg by mouth daily.    Yes [provider]  Ferrous Sulfate (IRON) 325 (65 Fe) MG TABS Take 1 tablet (325 mg total) by mouth 2 (two) times daily. Patient taking differently: Take 1 tablet by mouth 2 (two) times a week.  10/01/17  Yes Martensen, Charna Elizabeth III, PA-C  fluticasone (FLOVENT HFA) 220 MCG/ACT inhaler Inhale 1 puff into the lungs 2 (two) times daily.   Yes [provider]  guaiFENesin (MUCINEX) 600 MG 12 hr tablet Take 600 mg by mouth 2 (two) times daily as needed for cough.   Yes [provider]  Javier Docker Oil 500 MG CAPS Take 500  mg by mouth daily.    Yes [provider]  linaclotide (LINZESS) 290 MCG CAPS capsule Take 290 mcg by mouth daily.   Yes [provider]  lovastatin (MEVACOR) 10 MG tablet Take 10 mg by mouth daily. 09/22/14  Yes [provider]  meloxicam (MOBIC) 15 MG tablet Take 15 mg by mouth daily.  05/23/16  Yes [provider]  Menthol, Topical Analgesic, (BIOFREEZE EX) Apply 1 application topically daily as needed (muscle pain).   Yes [provider]  ondansetron (ZOFRAN ODT) 4 MG disintegrating tablet Take 1 tablet (4 mg total) by  mouth every 8 (eight) hours as needed for nausea or vomiting. 02/26/18  Yes Eula Listen, MD  polyethylene glycol (MIRALAX / GLYCOLAX) packet Take 17 g by mouth daily as needed (for constipation.).    Yes [provider]  pregabalin (LYRICA) 75 MG capsule Take 1 capsule (75 mg total) by mouth 2 (two) times daily. 04/06/18 10/03/18 Yes Milinda Pointer, MD  traZODone (DESYREL) 100 MG tablet Take 100 mg by mouth at bedtime.    Yes [provider]  vitamin B-12 (CYANOCOBALAMIN) 500 MCG tablet Take 500 mcg by mouth daily.   Yes [provider]  apixaban (ELIQUIS) 5 MG TABS tablet Take 2 tablets (10 mg total) by mouth 2 (two) times daily for 4 days. 05/04/18 05/08/18  Donne Hazel, MD  levofloxacin (LEVAQUIN) 750 MG tablet Take 1 tablet (750 mg total) by mouth every other day. Patient not taking: Reported on 05/17/2018 04/24/18   Caren Griffins, MD  metoprolol tartrate (LOPRESSOR) 25 MG tablet Take 1 tablet (25 mg total) by mouth 2 (two) times daily. Patient not taking: Reported on 05/17/2018 05/04/18 06/03/18  Donne Hazel, MD    Allergies Ampicillin; Ampicillin-sulbactam sodium; Ambien [zolpidem tartrate]; Flexeril [cyclobenzaprine]; Tape; Toradol [ketorolac tromethamine]; Sulfamethoxazole-trimethoprim; Zolpidem; Cephalexin; and Tramadol  Family History  Problem Relation Age of Onset  . Heart  failure Mother   . Heart attack Father     Social History Social History   Tobacco Use  . Smoking status: Former Smoker    Packs/day: 1.50    Years: 30.00    Pack years: 45.00    Types: E-cigarettes, Cigarettes    Last attempt to quit: 2013    Years since quitting: 6.9  . Smokeless tobacco: Never Used  Substance Use Topics  . Alcohol use: No    Alcohol/week: 0.0 standard drinks  . Drug use: No   Review of Systems Constitutional: No fever/chills Eyes: No visual changes. ENT: No sore throat. Cardiovascular: Denies chest pain. Respiratory: Denies shortness of breath. Gastrointestinal: No abdominal pain.   Genitourinary: Negative for dysuria. Musculoskeletal: Negative for back pain. Skin: Negative for rash. Neurological: Negative for headaches, areas of focal weakness or numbness.    ____________________________________________   PHYSICAL EXAM:  VITAL SIGNS: ED Triage Vitals  Enc Vitals Group     BP 05/17/18 1021 (!) 147/114     Pulse Rate 05/17/18 1010 (!) 124     Resp 05/17/18 1010 (!) 34     Temp 05/17/18 1010 99.6 F (37.6 C)     Temp Source 05/17/18 1010 Oral     SpO2 05/17/18 1010 96 %     Weight 05/17/18 1022 166 lb 14.2 oz (75.7 kg)     Height 05/17/18 1022 5\' 2"  (1.575 m)     Head Circumference --      Peak Flow --      Pain Score 05/17/18 1015 0     Pain Loc --      Pain Edu? --      Excl. in [place]? --     Constitutional: Alert and oriented. Well appearing and in no acute distress. Eyes: Conjunctivae are normal. Head: Atraumatic. Nose: No congestion/rhinnorhea. Mouth/Throat: Mucous membranes are moist. Neck: No stridor.  Cardiovascular: Normal rate, regular rhythm. Grossly normal heart sounds.  Good peripheral circulation. Respiratory: Normal respiratory effort.  No retractions. Lungs CTAB. Gastrointestinal: Soft and nontender. No distention. Musculoskeletal: No lower extremity tenderness nor edema. Neurologic:  Normal speech and language. No  gross focal neurologic  deficits are appreciated.  Skin:  Skin is warm, dry and intact. No rash noted. Psychiatric: Mood and affect are normal. Speech and behavior are normal.  ____________________________________________   LABS (all labs ordered are listed, but only abnormal results are displayed)  Labs Reviewed  CBC WITH DIFFERENTIAL/PLATELET - Abnormal; Notable for the following components:      Result Value   WBC 10.7 (*)    Neutro Abs 8.1 (*)    Eosinophils Absolute 0.7 (*)    All other components within normal limits  INFLUENZA PANEL BY PCR (TYPE A & B) - Abnormal; Notable for the following components:   Influenza A By PCR POSITIVE (*)    All other components within normal limits  COMPREHENSIVE METABOLIC PANEL - Abnormal; Notable for the following components:   Glucose, Bld 65 (*)    BUN 7 (*)    Albumin 3.0 (*)    GFR calc non Af Amer 59 (*)    All other components within normal limits  LACTIC ACID, PLASMA - Abnormal; Notable for the following components:   Lactic Acid, Venous 3.2 (*)    All other components within normal limits  CG4 I-STAT (LACTIC ACID) - Abnormal; Notable for the following components:   Lactic Acid, Venous 2.90 (*)    All other components within normal limits  CULTURE, BLOOD (ROUTINE X 2)  CULTURE, BLOOD (ROUTINE X 2)  EXPECTORATED SPUTUM ASSESSMENT W REFEX TO RESP CULTURE  PROTIME-INR  APTT  TROPONIN I  HIV ANTIBODY (ROUTINE TESTING W REFLEX)  I-STAT CG4 LACTIC ACID, ED   ____________________________________________  EKG  Reviewed enterotomy at 1010 Heart rate 125 QRS 80 QTc 400 Sinus tachycardia, no evidence of acute ischemia. ____________________________________________  RADIOLOGY  Ct Angio Chest Pe W And/or Wo Contrast  Result Date: 05/17/2018 CLINICAL DATA:  Shortness of breath with nonproductive cough and nausea. Wheezing. EXAM: CT ANGIOGRAPHY CHEST WITH CONTRAST TECHNIQUE: Multidetector CT imaging of the chest was performed using  the standard protocol during bolus administration of intravenous contrast. Multiplanar CT image reconstructions and MIPs were obtained to evaluate the vascular anatomy. CONTRAST:  34mL ISOVUE-370 IOPAMIDOL (ISOVUE-370) INJECTION 76% COMPARISON:  04/30/2018 and 10/23/2016 FINDINGS: Cardiovascular: Mild prominence of the heart unchanged. Minimal calcified plaque over the thoracic aorta. Mild stable prominence of the ascending thoracic aorta measuring 3.6 cm in AP diameter. Very minimal residual emboli visualized over the proximal right upper lobar pulmonary artery. Resolution of previously seen right lower lobar and left upper lobe pulmonary emboli. Mediastinum/Nodes: 1.1 cm precarinal lymph node. 1.2 cm subcarinal lymph node. 1 cm anterior right hilar lymph node. 1.1 cm posterior right hilar lymph node. These nodes are slightly larger, but likely reactive. Remaining mediastinal structures are unremarkable. Lungs/Pleura: Lungs are adequately inflated demonstrate patchy bilateral peripheral fibrotic change over the mid to lower lungs. Calcified granuloma over the posterior left upper lobe. Significant interval improvement of the previously seen bilateral patchy airspace process with minimal residual density over the posterior left upper lobe. Calcified granuloma left lower lobe. No effusion. Airways are unremarkable. Upper Abdomen: 1 cm hypodensity over the upper pole cortex left kidney unchanged likely a cyst. No acute findings. Musculoskeletal: Minimal soft tissue prominence/fluid adjacent the left sternal clavicular joint. Mild degenerate change of the spine. Remainder of the exam is unchanged. Review of the MIP images confirms the above findings. IMPRESSION: Interval improvement in known pulmonary emboli with minimal residual embolus over the proximal right upper lobar pulmonary artery. Significant interval improvement of previously noted patchy bilateral airspace  process with minimal residual opacification over  the posterior left upper lob which may be resolving infection e. Chronic fibrotic change. Reactive mediastinal nodes. Mild prominence of the ascending thoracic aorta measuring 3.6 cm in AP diameter. Recommend annual imaging followup by CTA or MRA. This recommendation follows 2010 ACCF/AHA/AATS/ACR/ASA/SCA/SCAI/SIR/STS/SVM Guidelines for the Diagnosis and Management of Patients with Thoracic Aortic Disease. Circulation.2010; 121: L381-O175. Aortic Atherosclerosis (ICD10-I70.0). Mild soft tissue thickening/fluid adjacent the left sternoclavicular joint. May be due to infectious or inflammatory process. Electronically Signed   By: Marin Olp M.D.   On: 05/17/2018 13:39   Dg Chest Port 1 View  Result Date: 05/17/2018 CLINICAL DATA:  Shortness of breath, tachycardia, COPD, recent pneumonia and PE 04/30/2018 EXAM: PORTABLE CHEST 1 VIEW COMPARISON:  04/30/2018, 05/03/2018 FINDINGS: Persistent cardiomegaly with some improvement in patchy bilateral airspace opacities. However there is persistent diffuse interstitial prominence which may represent a component of residual edema. No large effusion or pneumothorax. Scoliosis of the spine noted. Exam is rotated to the right. IMPRESSION: Cardiomegaly with persistent interstitial prominence may represent chronic lung disease or mild residual edema pattern Improving patchy bilateral airspace process particularly in the lower lobes compatible with resolving pneumonia. Electronically Signed   By: Jerilynn Mages.  Shick M.D.   On: 05/17/2018 10:57   CT angios pending at time of admission decision.  Dr. Jerelyn Charles will follow up from hospital service ____________________________________________   PROCEDURES  Procedure(s) performed: None  Procedures  Critical Care performed: Yes, see critical care note(s)  CRITICAL CARE Performed by: Delman Kitten   Total critical care time: 38 minutes  Critical care time was exclusive of separately billable procedures and treating other  patients.  Critical care was necessary to treat or prevent imminent or life-threatening deterioration.  Critical care was time spent personally by me on the following activities: development of treatment plan with patient and/or surrogate as well as nursing, discussions with consultants, evaluation of patient's response to treatment, examination of patient, obtaining history from patient or surrogate, ordering and performing treatments and interventions, ordering and review of laboratory studies, ordering and review of radiographic studies, pulse oximetry and re-evaluation of patient's condition.  Patient presents with sepsis.  Required tamsulosin for influenza A.  Follow recommendation from hospital system to give 30 mg dosing.  Also will add Levaquin as the patient has recent treatment with Levaquin now with resolving infiltrates.  Suspect her flu positive today as the source of fever and shortness of breath and cough.  However given her pleuritic chest pain have ordered CT angiogram to further evaluate and exclude worsening pulmonary embolism as well.  Fluid hydration, will order second bolus of fluid at this time for elevated lactate.  Patient meets criteria for severe sepsis at this time ____________________________________________   INITIAL IMPRESSION / ASSESSMENT AND PLAN / ED COURSE  Pertinent labs & imaging results that were available during my care of the patient were reviewed by me and considered in my medical decision making (see chart for details).    Patient presents for shortness of breath and pleuritic slight discomfort in the chest.  Also febrile.  Influenza test positive, also concern for possibly some ongoing pneumonia for which we will continue her on antibiotic, also Tamiflu, CT scan pending at time of admission to further evaluate for pulmonary etiology and ensure no worsening pulmonary embolism.   ----------------------------------------- 11:47 AM on  05/17/2018 -----------------------------------------  Sepsis reassessment completed.  No notable change, except remains tachycardic slightly more so with heart rate 130, blood pressure 102/92.  Appears potentially worsening, additional fluid bolus ordered at this time.  Antibiotics including Levaquin to start.  Tamiflu.  Awaiting admission to hospital service and CT angiogram.    ____________________________________________   FINAL CLINICAL IMPRESSION(S) / ED DIAGNOSES  Final diagnoses:  Sepsis, due to unspecified organism, unspecified whether acute organ dysfunction present [facility])  Influenza A  Pleuritic chest pain        Note:  This document was prepared using Dragon voice recognition software and may include unintentional dictation errors       Delman Kitten, MD 05/17/18 1533

## 2018-05-17 NOTE — Progress Notes (Signed)
PT Cancellation Note  Patient Details Name: [name] MRN: [id number] DOB: [birth year]   Cancelled Treatment:    Reason Eval/Treat Not Completed: Medical issues which prohibited therapy.  Newly diagnosed PE, will await pt to be treated and then come back for PT assessment.   Ramond Dial 05/17/2018, 2:30 PM   Mee Hives, PT MS Acute Rehab Dept. Number: [place] [facility] [place]

## 2018-05-17 NOTE — Progress Notes (Signed)
Lab called this RN with critical lab value of lactic acid level of 3.2.  Dr. Margaretmary Eddy called and made aware. New orders received.

## 2018-05-17 NOTE — ED Notes (Signed)
Date and time results received: 05/17/18 11:15 (use smartphrase ".now" to insert current time)  Test: Lactic Acid Critical Value: 2.9  Name of Provider Notified: Dr. Jacqualine Code  Orders Received? Or Actions Taken?: acknowledged

## 2018-05-17 NOTE — Consult Note (Signed)
Pharmacy Antibiotic Note  [name] is a 77 y.o. female admitted on 05/17/2018 with pneumonia.  Pharmacy has been consulted for Levaquin dosing.  Plan: Start Levaquin 750mg  IV every 24 hours  Height: 5\' 2"  (157.5 cm) Weight: 166 lb 14.2 oz (75.7 kg) IBW/kg (Calculated) : 50.1  Temp (24hrs), Avg:99.9 F (37.7 C), Min:99.6 F (37.6 C), Max:100.2 F (37.9 C)  Recent Labs  Lab 05/13/18 1141 05/17/18 1037 05/17/18 1045 05/17/18 1126  WBC 9.1 10.7*  --   --   CREATININE 1.15  --   --  0.94  LATICACIDVEN  --   --  2.90*  --     Estimated Creatinine Clearance: 48.5 mL/min (by C-G formula based on SCr of 0.94 mg/dL).     Antimicrobials this admission: Levaquin 12/22 >>   Microbiology results: 12/22 BCx: pending  Thank you for allowing pharmacy to be a part of this patient's care.  Dallie Piles, PharmD 05/17/2018 12:08 PM

## 2018-05-17 NOTE — ED Notes (Signed)
Pt presents to ED with SoB, dry unproductive cough and nausea. A&O x3 at time of assessment. Expiratory wheezing noted, breathing labored. Pt placed on 3L O2 via [place] and initial sat at 96%. Pt states that her daughter was seen at this ED last night and Dx with flu A. Pt states that she had chest pain during transport but denies any pain at time of this assessment. Initial temp of 99.1. Pt meets SARS criteria with respiration of 32 and pulse of 120. Will draw blood cult x2. EDP notified.

## 2018-05-17 NOTE — H&P (Signed)
[facility] [facility] [place] NAME: [name]    MR#:  [id number]  DATE OF BIRTH:  12-Mar-1941  DATE OF ADMISSION:  05/17/2018  PRIMARY CARE PHYSICIAN: [name], FNP   REQUESTING/REFERRING PHYSICIAN: quale   CHIEF COMPLAINT:  sob  HISTORY OF PRESENT ILLNESS:  [name]  is a 77 y.o. female with a known history of ch hypoxic resp failure, on 3 lit of o2 , COPD, hypertension, hyperlipidemia, history of pulmonary embolism, recent history of pneumonia on levofloxacin and multiple other medical problems is presenting to the ED with worsening of shortness of breath.  Flu test is positive patient is started on Tamiflu.  CT angiogram of the chest with resolving pulmonary embolism and pneumonia. Patient has received steroids and hospitalist team is called to admit the patient.  Patient was tachycardic and with elevated lactic acid at the time of admission.  PAST MEDICAL HISTORY:   Past Medical History:  Diagnosis Date  . Acute postoperative pain 02/03/2018  . Anemia   . B12 deficiency   . Bacteremia 10/07/2014  . Bladder incontinence   . Blind left eye   . Cancer ([place])    skin cancer   . Cataract   . Cervical spine fracture ([place])   . Chest pain 07/29/2013  . Compression fracture    C7,  upper T spine -compression fx  . COPD (chronic obstructive pulmonary disease) ([place])   . DDD (degenerative disc disease), lumbar   . Depression    major  . Diffuse myofascial pain syndrome 03/24/2015  . Dyspnea    at times- when activty  . Fever 10/05/2014  . GERD (gastroesophageal reflux disease)   . Hiatal hernia   . Hyperlipidemia   . Hypertension   . 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 Macular degeneration   . On home oxygen therapy    "3L; all the time" (04/30/2018)  . Osteoarthritis   . Osteoporosis   . Pneumonia    years ago  . Pulmonary embolism ([place]) 04/30/2018  . Reactive airway disease   . Rupture of bowel  ([place])   . Sepsis ([place]) 10/08/2014  . Stroke [facility])    "mini stroke"- balance and memory issue  . Stroke ([place])   . Wrist pain, acute 09/10/2012    PAST SURGICAL HISTOIRY:   Past Surgical History:  Procedure Laterality Date  . ABDOMINAL HYSTERECTOMY    . ABDOMINAL SURGERY    . APPENDECTOMY    . BLADDER SURGERY    . CATARACT EXTRACTION W/ INTRAOCULAR LENS  IMPLANT, BILATERAL Bilateral   . CHOLECYSTECTOMY    . COLON SURGERY    . COLOSTOMY REVERSAL    . INTRAMEDULLARY (IM) NAIL INTERTROCHANTERIC Right 09/13/2016   Procedure: INTRAMEDULLARY (IM) NAIL INTERTROCHANTRIC RIGHT;  Surgeon: Leandrew Koyanagi, MD;  Location: [facility];  Service: Orthopedics;  Laterality: Right;  . JOINT REPLACEMENT Bilateral   . KNEE SURGERY    . OSTOMY    . RECTOCELE REPAIR    . TOE SURGERY Right    3rd  . TOTAL HIP ARTHROPLASTY Right 09/30/2017   Procedure: RIGHT HIP IM NAIL REMOVAL WITH CONVERSION TO TOTAL HIP ARTHOPLASTY, anterior approach;  Surgeon: Renette Butters, MD;  Location: [facility];  Service: Orthopedics;  Laterality: Right;    SOCIAL HISTORY:   Social History   Tobacco Use  . Smoking status: Former Smoker    Packs/day: 1.50    Years: 30.00    Pack  years: 45.00    Types: E-cigarettes, Cigarettes    Last attempt to quit: 2013    Years since quitting: 6.9  . Smokeless tobacco: Never Used  Substance Use Topics  . Alcohol use: No    Alcohol/week: 0.0 standard drinks    FAMILY HISTORY:   Family History  Problem Relation Age of Onset  . Heart failure Mother   . Heart attack Father     DRUG ALLERGIES:   Allergies  Allergen Reactions  . Ampicillin Anaphylaxis, Nausea Only and Swelling    SEVERE HEADACHE MUSCLE CRAMPS ANGIOEDEMA THRUSH PATIENT HAS HAD A PCN REACTION WITH IMMEDIATE RASH, FACIAL/TONGUE/THROAT SWELLING, SOB, OR LIGHTHEADEDNESS WITH HYPOTENSION:  #  #  YES  #  #  Has patient had a PCN reaction causing severe rash involving mucus membranes or skin necrosis: No Has patient had a  PCN reaction that required hospitalization:No Has patient had a PCN reaction occurring within the last 10 years: No.  . Ampicillin-Sulbactam Sodium Anaphylaxis and Other (See Comments)    SEVERE HEADACHE MUSCLE CRAMPS ANGIOEDEMA THRUSH PATIENT HAS HAD A PCN REACTION WITH IMMEDIATE RASH, FACIAL/TONGUE/THROAT SWELLING, SOB, OR LIGHTHEADEDNESS WITH HYPOTENSION: # # YES # # Has patient had a PCN reaction causing severe rash involving mucus membranes or skin necrosis: No Has patient had a PCN reaction that required hospitalization:No Has patient had a PCN reaction occurring within the last 10 years: No   . Ambien [Zolpidem Tartrate] Nausea And Vomiting and Other (See Comments)    HALLUCINATIONS SWEATING  . Flexeril [Cyclobenzaprine] Other (See Comments)    EXTRAPYRAMIDAL MOVEMENT INVOLUNTARY MUSCLE JERKING  . Tape Itching, Rash and Other (See Comments)    Paper tape only. Adhesive tape=itching/burning/rash  . Toradol [Ketorolac Tromethamine] Nausea Only and Other (See Comments)    HEADACHE  BACKACHE  . Sulfamethoxazole-Trimethoprim     UNSPECIFIED REACTION   . Zolpidem     Hallucinations  . Cephalexin Rash and Other (See Comments)    HEADACHES  . Tramadol Rash and Other (See Comments)    HEADACHE     REVIEW OF SYSTEMS:  CONSTITUTIONAL: No fever, fatigue or weakness.  EYES: No blurred or double vision.  EARS, NOSE, AND THROAT: No tinnitus or ear pain.  RESPIRATORY: Reporting cough, shortness of breath, minimal wheezing denies hemoptysis.  CARDIOVASCULAR: No chest pain, orthopnea, edema.  GASTROINTESTINAL: No nausea, vomiting, diarrhea or abdominal pain.  GENITOURINARY: No dysuria, hematuria.  ENDOCRINE: No polyuria, nocturia,  HEMATOLOGY: No anemia, easy bruising or bleeding SKIN: No rash or lesion. MUSCULOSKELETAL: No joint pain or arthritis.   NEUROLOGIC: No tingling, numbness, weakness.  PSYCHIATRY: No anxiety or depression.   MEDICATIONS AT HOME:   Prior to Admission  medications   Medication Sig Start Date End Date Taking? Authorizing Provider  albuterol (ACCUNEB) 1.25 MG/3ML nebulizer solution Take 1 ampule by nebulization every 6 (six) hours as needed for wheezing.   Yes [provider]  albuterol (PROVENTIL HFA;VENTOLIN HFA) 108 (90 BASE) MCG/ACT inhaler Inhale 2 puffs into the lungs every 6 (six) hours as needed for wheezing or shortness of breath.    Yes [provider]  alendronate (FOSAMAX) 70 MG tablet Take 70 mg by mouth every Monday. Take with a full glass of water on an empty stomach.   Yes [provider]  ALPRAZolam Duanne Moron) 1 MG tablet Take 1 tablet (1 mg total) by mouth at bedtime as needed for sleep. 05/15/18  Yes [name], FNP  amLODipine (NORVASC) 5 MG tablet Take  5 mg by mouth daily.  04/06/13  Yes [provider]  apixaban (ELIQUIS) 5 MG TABS tablet Take 1 tablet (5 mg total) by mouth 2 (two) times daily. Start after 10mg  twice day dosing Patient taking differently: Take 5 mg by mouth 2 (two) times daily.  05/09/18 06/08/18 Yes Donne Hazel, MD  benzonatate (TESSALON) 200 MG capsule Take 1 capsule (200 mg total) by mouth 2 (two) times daily as needed for cough. 05/04/18  Yes Donne Hazel, MD  Calcium 500-100 MG-UNIT CHEW Chew 1 tablet by mouth daily.   Yes [provider]  Cholecalciferol (VITAMIN D HIGH POTENCY PO) Take 5,000 Units by mouth daily.   Yes [provider]  citalopram (CELEXA) 20 MG tablet Take 20 mg by mouth daily.  02/27/17  Yes [provider]  docusate sodium (COLACE) 100 MG capsule Take 200 mg by mouth 2 (two) times daily as needed for moderate constipation.    Yes [provider]  Elastic Bandages & Supports (MEDICAL COMPRESSION SOCKS) MISC 1 each by Does not apply route daily as needed. 05/15/18  Yes [name], FNP  esomeprazole (NEXIUM) 40 MG capsule Take 40 mg by mouth daily.    Yes [provider]  Ferrous Sulfate  (IRON) 325 (65 Fe) MG TABS Take 1 tablet (325 mg total) by mouth 2 (two) times daily. Patient taking differently: Take 1 tablet by mouth 2 (two) times a week.  10/01/17  Yes Martensen, Charna Elizabeth III, PA-C  fluticasone (FLOVENT HFA) 220 MCG/ACT inhaler Inhale 1 puff into the lungs 2 (two) times daily.   Yes [provider]  guaiFENesin (MUCINEX) 600 MG 12 hr tablet Take 600 mg by mouth 2 (two) times daily as needed for cough.   Yes [provider]  Javier Docker Oil 500 MG CAPS Take 500 mg by mouth daily.    Yes [provider]  linaclotide (LINZESS) 290 MCG CAPS capsule Take 290 mcg by mouth daily.   Yes [provider]  lovastatin (MEVACOR) 10 MG tablet Take 10 mg by mouth daily. 09/22/14  Yes [provider]  meloxicam (MOBIC) 15 MG tablet Take 15 mg by mouth daily.  05/23/16  Yes [provider]  Menthol, Topical Analgesic, (BIOFREEZE EX) Apply 1 application topically daily as needed (muscle pain).   Yes [provider]  ondansetron (ZOFRAN ODT) 4 MG disintegrating tablet Take 1 tablet (4 mg total) by mouth every 8 (eight) hours as needed for nausea or vomiting. 02/26/18  Yes Eula Listen, MD  polyethylene glycol (MIRALAX / GLYCOLAX) packet Take 17 g by mouth daily as needed (for constipation.).    Yes [provider]  pregabalin (LYRICA) 75 MG capsule Take 1 capsule (75 mg total) by mouth 2 (two) times daily. 04/06/18 10/03/18 Yes Milinda Pointer, MD  traZODone (DESYREL) 100 MG tablet Take 100 mg by mouth at bedtime.    Yes [provider]  vitamin B-12 (CYANOCOBALAMIN) 500 MCG tablet Take 500 mcg by mouth daily.   Yes [provider]  apixaban (ELIQUIS) 5 MG TABS tablet Take 2 tablets (10 mg total) by mouth 2 (two) times daily for 4 days. 05/04/18 05/08/18  Donne Hazel, MD  levofloxacin (LEVAQUIN) 750 MG tablet Take 1 tablet (750 mg total) by mouth every other day. Patient not taking: Reported on  05/17/2018 04/24/18   Caren Griffins, MD  metoprolol tartrate (LOPRESSOR) 25 MG tablet Take 1 tablet (25 mg total) by mouth 2 (two)  times daily. Patient not taking: Reported on 05/17/2018 05/04/18 06/03/18  Donne Hazel, MD      VITAL SIGNS:  Blood pressure 106/70, pulse (!) 113, temperature 99.2 F (37.3 C), temperature source Oral, resp. rate (!) 37, height 5\' 4"  (1.626 m), weight 75.6 kg, SpO2 96 %.  PHYSICAL EXAMINATION:  GENERAL:  77 y.o.-year-old patient lying in the bed with no acute distress.  EYES: Pupils equal, round, reactive to light and accommodation. No scleral icterus. Extraocular muscles intact.  HEENT: Head atraumatic, normocephalic. Oropharynx and nasopharynx clear.  NECK:  Supple, no jugular venous distention. No thyroid enlargement, no tenderness.  LUNGS: Coarse bronchial breath sounds bilaterally, few crepitations, minimal wheezing, no rales,rhonchi o. No use of accessory muscles of respiration.  CARDIOVASCULAR: S1, S2 normal. No murmurs, rubs, or gallops.  ABDOMEN: Soft, nontender, nondistended. Bowel sounds present.  EXTREMITIES: No pedal edema, cyanosis, or clubbing.  NEUROLOGIC: Awake, alert and oriented x3 sensation intact. Gait not checked.  PSYCHIATRIC: The patient is alert and oriented x 3.  SKIN: No obvious rash, lesion, or ulcer.   LABORATORY PANEL:   CBC Recent Labs  Lab 05/17/18 1037  WBC 10.7*  HGB 12.5  HCT 41.1  PLT 229   ------------------------------------------------------------------------------------------------------------------  Chemistries  Recent Labs  Lab 05/17/18 1126  NA 139  K 3.6  CL 106  CO2 24  GLUCOSE 65*  BUN 7*  CREATININE 0.94  CALCIUM 8.9  AST 26  ALT 12  ALKPHOS 51  BILITOT 0.6   ------------------------------------------------------------------------------------------------------------------  Cardiac Enzymes Recent Labs  Lab 05/17/18 1126  TROPONINI <0.03    ------------------------------------------------------------------------------------------------------------------  RADIOLOGY:  Ct Angio Chest Pe W And/or Wo Contrast  Result Date: 05/17/2018 CLINICAL DATA:  Shortness of breath with nonproductive cough and nausea. Wheezing. EXAM: CT ANGIOGRAPHY CHEST WITH CONTRAST TECHNIQUE: Multidetector CT imaging of the chest was performed using the standard protocol during bolus administration of intravenous contrast. Multiplanar CT image reconstructions and MIPs were obtained to evaluate the vascular anatomy. CONTRAST:  53mL ISOVUE-370 IOPAMIDOL (ISOVUE-370) INJECTION 76% COMPARISON:  04/30/2018 and 10/23/2016 FINDINGS: Cardiovascular: Mild prominence of the heart unchanged. Minimal calcified plaque over the thoracic aorta. Mild stable prominence of the ascending thoracic aorta measuring 3.6 cm in AP diameter. Very minimal residual emboli visualized over the proximal right upper lobar pulmonary artery. Resolution of previously seen right lower lobar and left upper lobe pulmonary emboli. Mediastinum/Nodes: 1.1 cm precarinal lymph node. 1.2 cm subcarinal lymph node. 1 cm anterior right hilar lymph node. 1.1 cm posterior right hilar lymph node. These nodes are slightly larger, but likely reactive. Remaining mediastinal structures are unremarkable. Lungs/Pleura: Lungs are adequately inflated demonstrate patchy bilateral peripheral fibrotic change over the mid to lower lungs. Calcified granuloma over the posterior left upper lobe. Significant interval improvement of the previously seen bilateral patchy airspace process with minimal residual density over the posterior left upper lobe. Calcified granuloma left lower lobe. No effusion. Airways are unremarkable. Upper Abdomen: 1 cm hypodensity over the upper pole cortex left kidney unchanged likely a cyst. No acute findings. Musculoskeletal: Minimal soft tissue prominence/fluid adjacent the left sternal clavicular joint. Mild  degenerate change of the spine. Remainder of the exam is unchanged. Review of the MIP images confirms the above findings. IMPRESSION: Interval improvement in known pulmonary emboli with minimal residual embolus over the proximal right upper lobar pulmonary artery. Significant interval improvement of previously noted patchy bilateral airspace process with minimal residual opacification over the posterior left upper lob which may be resolving infection e.  Chronic fibrotic change. Reactive mediastinal nodes. Mild prominence of the ascending thoracic aorta measuring 3.6 cm in AP diameter. Recommend annual imaging followup by CTA or MRA. This recommendation follows 2010 ACCF/AHA/AATS/ACR/ASA/SCA/SCAI/SIR/STS/SVM Guidelines for the Diagnosis and Management of Patients with Thoracic Aortic Disease. Circulation.2010; 121: S315-X458. Aortic Atherosclerosis (ICD10-I70.0). Mild soft tissue thickening/fluid adjacent the left sternoclavicular joint. May be due to infectious or inflammatory process. Electronically Signed   By: Marin Olp M.D.   On: 05/17/2018 13:39   Dg Chest Port 1 View  Result Date: 05/17/2018 CLINICAL DATA:  Shortness of breath, tachycardia, COPD, recent pneumonia and PE 04/30/2018 EXAM: PORTABLE CHEST 1 VIEW COMPARISON:  04/30/2018, 05/03/2018 FINDINGS: Persistent cardiomegaly with some improvement in patchy bilateral airspace opacities. However there is persistent diffuse interstitial prominence which may represent a component of residual edema. No large effusion or pneumothorax. Scoliosis of the spine noted. Exam is rotated to the right. IMPRESSION: Cardiomegaly with persistent interstitial prominence may represent chronic lung disease or mild residual edema pattern Improving patchy bilateral airspace process particularly in the lower lobes compatible with resolving pneumonia. Electronically Signed   By: Jerilynn Mages.  Shick M.D.   On: 05/17/2018 10:57    EKG:   Orders placed or performed during the  hospital encounter of 05/17/18  . ED EKG 12-Lead  . ED EKG  . ED EKG 12-Lead  . ED EKG  . EKG 12-Lead  . EKG 12-Lead  . EKG 12-Lead  . EKG 12-Lead    IMPRESSION AND PLAN:    Acute respiratory distress from flu, COPD exacerbation with resolving pneumonia and pulmonary embolism Admit to telemetry IV Solu-Medrol Bronchodilator treatment Tamiflu Continue oxygen 3 L and titrate as needed Azithromycin-empiric antibiotic  Influenza a Tamiflu and bronchodilator treatments as needed  Recent history of pneumonia completed antibiotic course with levofloxacin CT chest reveals resolving pneumonia  History of pulmonary embolism which is also clinically improving continue Eliquis  GERD PPI  Chronic pain syndrome pain management as needed and continue home medication Lyrica  DVT prophylaxis patient on Eliquis  All the records are reviewed and case discussed with ED provider. Management plans discussed with the patient, family and they are in agreement.  CODE STATUS: fc   TOTAL TIME TAKING CARE OF THIS PATIENT: 43  minutes.   Note: This dictation was prepared with Dragon dictation along with smaller phrase technology. Any transcriptional errors that result from this process are unintentional.  Nicholes Mango M.D on 05/17/2018 at 2:28 PM  Between 7am to 6pm - Pager - [phone]  After 6pm go to [email or web address] - password [email or web address] [facility]  Office  [phone]  CC: Primary care physician; [name], FNP

## 2018-05-17 NOTE — ED Triage Notes (Signed)
Pt arrives to ED from home via [facility] with c/c of SoB and chest pain beginning at 0100 this morning. Pt exhibiting dry cough and EMS states her O2 sat during transport was 89%. Placed on 3L via nasal cannula with O2 sat increasing to 96%. Pt states her daughter was seen last night at [facility] ED and Dx with flu A. EDP notified.

## 2018-05-17 NOTE — Progress Notes (Signed)
Patient admitted to 2A 237 from ED via stretcher.  Report called and given to this RN from ED.  Patient positive for flu type A.  Droplet precautions in place.  Patient oriented to floor and surroundings.  Call bell in reach.

## 2018-05-17 NOTE — Progress Notes (Signed)
Family Meeting Note  Advance Directive:yes  Today a meeting took place with the Patient.    The following clinical team members were present during this meeting:MD  The following were discussed:Patient's diagnosis: Acute respiratory distress, influenza, COPD exacerbation, resolving pneumonia and underlying pulmonary embolism, chronic pain syndrome, GERD, treatment plan of care discussed in detail with the patient.  She verbalized understanding of the plan.  , Patient's progosis: Unable to determine and Goals for treatment: Full Code Daughter Juliann Pulse is the healthcare POA  Additional follow-up to be provided: Hospitalist  Time spent during discussion:17 min  Nicholes Mango, MD

## 2018-05-17 NOTE — ED Notes (Signed)
ED TO INPATIENT HANDOFF REPORT  Name/Age/Gender [name] 77 y.o. female  Code Status Code Status History    Date Active Date Inactive Code Status Order ID Comments User Context   04/30/2018 1855 05/04/2018 1931 Full Code [id number]  Merton Border, MD Inpatient   04/20/2018 1315 04/25/2018 1827 Full Code [id number]  Janora Norlander, MD ED   10/27/2017 0058 10/30/2017 1606 Full Code [id number]  Etta Quill, DO ED   09/30/2017 1752 10/02/2017 2148 Full Code [id number]  Prudencio Burly III, PA-C Inpatient   08/24/2017 1129 08/25/2017 1713 Full Code [id number]  Vashti Hey, MD Inpatient   09/13/2016 0759 09/16/2016 2102 Full Code [id number]  Brenton Grills, PA-C Inpatient   05/22/2015 1903 05/24/2015 1935 Full Code [id number]  Reyne Dumas, MD ED   10/05/2014 2050 10/06/2014 1628 Full Code [id number]  Elgergawy, Silver Huguenin, MD Inpatient   07/30/2013 0040 07/30/2013 1844 Full Code [id number]  Rise Patience, MD Inpatient      Home/SNF/Other Home  Chief Complaint difficulty breathing  Level of Care/Admitting Diagnosis ED Disposition    ED Disposition Condition [facility] Area: [facility] [[id number]]  Level of Care: Telemetry [5]  Diagnosis: COPD with acute exacerbation [facility]) [[id number]]  Admitting Physician: Nicholes Mango [[id number]]  Attending Physician: Nicholes Mango [[id number]]  Estimated length of stay: 3 - 4 days  Certification:: I certify this patient will need inpatient services for at least 2 midnights  PT Class (Do Not Modify): Inpatient [101]  PT Acc Code (Do Not Modify): Private [1]       Medical History Past Medical History:  Diagnosis Date  . Acute postoperative pain 02/03/2018  . Anemia   . B12 deficiency   . Bacteremia 10/07/2014  . Bladder incontinence   . Blind left eye   . Cancer ([employer])    skin cancer   . Cataract   . Cervical spine fracture ([place])   . Chest pain 07/29/2013  . Compression fracture    C7,  upper T spine  -compression fx  . COPD (chronic obstructive pulmonary disease) ([place])   . DDD (degenerative disc disease), lumbar   . Depression    major  . Diffuse myofascial pain syndrome 03/24/2015  . Dyspnea    at times- when activty  . Fever 10/05/2014  . GERD (gastroesophageal reflux disease)   . Hiatal hernia   . Hyperlipidemia   . Hypertension   . 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 Macular degeneration   . On home oxygen therapy    "3L; all the time" (04/30/2018)  . Osteoarthritis   . Osteoporosis   . Pneumonia    years ago  . Pulmonary embolism ([place]) 04/30/2018  . Reactive airway disease   . Rupture of bowel ([place])   . Sepsis ([place]) 10/08/2014  . Stroke [facility])    "mini stroke"- balance and memory issue  . Stroke ([place])   . Wrist pain, acute 09/10/2012    Allergies Allergies  Allergen Reactions  . Ampicillin Anaphylaxis, Nausea Only and Swelling    SEVERE HEADACHE MUSCLE CRAMPS ANGIOEDEMA THRUSH PATIENT HAS HAD A PCN REACTION WITH IMMEDIATE RASH, FACIAL/TONGUE/THROAT SWELLING, SOB, OR LIGHTHEADEDNESS WITH HYPOTENSION:  #  #  YES  #  #  Has patient had a PCN reaction causing severe rash involving mucus membranes or skin necrosis: No Has patient had a PCN reaction that required hospitalization:No Has patient had a PCN reaction occurring within the last 10 years:  No.  . Ampicillin-Sulbactam Sodium Anaphylaxis and Other (See Comments)    SEVERE HEADACHE MUSCLE CRAMPS ANGIOEDEMA THRUSH PATIENT HAS HAD A PCN REACTION WITH IMMEDIATE RASH, FACIAL/TONGUE/THROAT SWELLING, SOB, OR LIGHTHEADEDNESS WITH HYPOTENSION: # # YES # # Has patient had a PCN reaction causing severe rash involving mucus membranes or skin necrosis: No Has patient had a PCN reaction that required hospitalization:No Has patient had a PCN reaction occurring within the last 10 years: No   . Ambien [Zolpidem Tartrate] Nausea And Vomiting and Other (See Comments)    HALLUCINATIONS SWEATING  . Flexeril  [Cyclobenzaprine] Other (See Comments)    EXTRAPYRAMIDAL MOVEMENT INVOLUNTARY MUSCLE JERKING  . Tape Itching, Rash and Other (See Comments)    Paper tape only. Adhesive tape=itching/burning/rash  . Toradol [Ketorolac Tromethamine] Nausea Only and Other (See Comments)    HEADACHE  BACKACHE  . Sulfamethoxazole-Trimethoprim     UNSPECIFIED REACTION   . Zolpidem     Hallucinations  . Cephalexin Rash and Other (See Comments)    HEADACHES  . Tramadol Rash and Other (See Comments)    HEADACHE     IV Location/Drains/Wounds Patient Lines/Drains/Airways Status   Active Line/Drains/Airways    Name:   Placement date:   Placement time:   Site:   Days:   Peripheral IV 05/17/18 Right Forearm   05/17/18    1105    Forearm   less than 1   External Urinary Catheter   05/02/18    1500    -   15   Incision (Closed) 09/13/16 Hip Right   09/13/16    1637     611   Incision (Closed) 09/30/17 Hip Right   09/30/17    1217     229   Pressure Injury 05/02/18 Deep Tissue Injury - Purple or maroon localized area of discolored intact skin or blood-filled blister due to damage of underlying soft tissue from pressure and/or shear.   05/02/18    0400     15          Labs/Imaging Results for orders placed or performed during the hospital encounter of 05/17/18 (from the past 48 hour(s))  CBC WITH DIFFERENTIAL     Status: Abnormal   Collection Time: 05/17/18 10:37 AM  Result Value Ref Range   WBC 10.7 (H) 4.0 - 10.5 K/uL   RBC 4.22 3.87 - 5.11 MIL/uL   Hemoglobin 12.5 12.0 - 15.0 g/dL   HCT 41.1 36.0 - 46.0 %   MCV 97.4 80.0 - 100.0 fL   MCH 29.6 26.0 - 34.0 pg   MCHC 30.4 30.0 - 36.0 g/dL   RDW 13.9 11.5 - 15.5 %   Platelets 229 150 - 400 K/uL   nRBC 0.0 0.0 - 0.2 %   Neutrophils Relative % 75 %   Neutro Abs 8.1 (H) 1.7 - 7.7 K/uL   Lymphocytes Relative 9 %   Lymphs Abs 0.9 0.7 - 4.0 K/uL   Monocytes Relative 9 %   Monocytes Absolute 1.0 0.1 - 1.0 K/uL   Eosinophils Relative 6 %   Eosinophils  Absolute 0.7 (H) 0.0 - 0.5 K/uL   Basophils Relative 1 %   Basophils Absolute 0.1 0.0 - 0.1 K/uL   Immature Granulocytes 0 %   Abs Immature Granulocytes 0.04 0.00 - 0.07 K/uL    Comment: Performed at [facility], [address] [place], [place] [address]  CG4 I-STAT (Lactic acid)     Status: Abnormal   Collection Time: 05/17/18  10:45 AM  Result Value Ref Range   Lactic Acid, Venous 2.90 (HH) 0.5 - 1.9 mmol/L  Influenza panel by PCR (type A & B)     Status: Abnormal   Collection Time: 05/17/18 10:53 AM  Result Value Ref Range   Influenza A By PCR POSITIVE (A) NEGATIVE   Influenza B By PCR NEGATIVE NEGATIVE    Comment: (NOTE) The Xpert Xpress Flu assay is intended as an aid in the diagnosis of  influenza and should not be used as a sole basis for treatment.  This  assay is FDA approved for nasopharyngeal swab specimens only. Nasal  washings and aspirates are unacceptable for Xpert Xpress Flu testing. Performed at [facility], [address] [place], [place] [address]   Comprehensive metabolic panel     Status: Abnormal   Collection Time: 05/17/18 11:26 AM  Result Value Ref Range   Sodium 139 135 - 145 mmol/L   Potassium 3.6 3.5 - 5.1 mmol/L   Chloride 106 98 - 111 mmol/L   CO2 24 22 - 32 mmol/L   Glucose, Bld 65 (L) 70 - 99 mg/dL   BUN 7 (L) 8 - 23 mg/dL   Creatinine, Ser 0.94 0.44 - 1.00 mg/dL   Calcium 8.9 8.9 - 10.3 mg/dL   Total Protein 7.2 6.5 - 8.1 g/dL   Albumin 3.0 (L) 3.5 - 5.0 g/dL   AST 26 15 - 41 U/L   ALT 12 0 - 44 U/L   Alkaline Phosphatase 51 38 - 126 U/L   Total Bilirubin 0.6 0.3 - 1.2 mg/dL   GFR calc non Af Amer 59 (L) >60 mL/min   GFR calc Af Amer >60 >60 mL/min   Anion gap 9 5 - 15    Comment: Performed at [facility], [address], [place], [place] [address]  Protime-INR     Status: None   Collection Time: 05/17/18 11:26 AM  Result Value Ref Range   Prothrombin Time 15.2 11.4 - 15.2 seconds   INR 1.21      Comment: Performed at [facility], [address] [place], [place] [address]  APTT     Status: None   Collection Time: 05/17/18 11:26 AM  Result Value Ref Range   aPTT 30 24 - 36 seconds    Comment: Performed at [facility], [address] [place], [place] [address]  Troponin I -     Status: None   Collection Time: 05/17/18 11:26 AM  Result Value Ref Range   Troponin I <0.03 <0.03 ng/mL    Comment: Performed at [facility], [address] [place], [place] [address]   Dg Chest Port 1 View  Result Date: 05/17/2018 CLINICAL DATA:  Shortness of breath, tachycardia, COPD, recent pneumonia and PE 04/30/2018 EXAM: PORTABLE CHEST 1 VIEW COMPARISON:  04/30/2018, 05/03/2018 FINDINGS: Persistent cardiomegaly with some improvement in patchy bilateral airspace opacities. However there is persistent diffuse interstitial prominence which may represent a component of residual edema. No large effusion or pneumothorax. Scoliosis of the spine noted. Exam is rotated to the right. IMPRESSION: Cardiomegaly with persistent interstitial prominence may represent chronic lung disease or mild residual edema pattern Improving patchy bilateral airspace process particularly in the lower lobes compatible with resolving pneumonia. Electronically Signed   By: Jerilynn Mages.  Shick M.D.   On: 05/17/2018 10:57    Pending Labs Unresulted Labs (From admission, onward)    Start     Ordered   05/17/18 1200  Culture, sputum-assessment  Once,   STAT     05/17/18 1201   05/17/18 1159  HIV antibody  Once,   STAT     05/17/18 1201   05/17/18 1031  Blood Culture (routine x 2)  BLOOD CULTURE X 2,   STAT     05/17/18 1030          Vitals/Pain Today's Vitals   05/17/18 1110 05/17/18 1130 05/17/18 1201 05/17/18 1231  BP: (!) 120/108 (!) 102/92 138/84 108/71  Pulse: (!) 140 (!) 135 (!) 133 (!) 136  Resp: 16 (!) 22 (!) 23 (!) 37  Temp: 100.2 F (37.9 C)     TempSrc: Oral     SpO2: 93% 94% 97% 94%   Weight:      Height:      PainSc: 8        Isolation Precautions Droplet precaution  Medications Medications  levofloxacin (LEVAQUIN) IVPB 750 mg (750 mg Intravenous New Bag/Given 05/17/18 1214)  sodium chloride 0.9 % bolus 500 mL (500 mLs Intravenous New Bag/Given 05/17/18 1240)  methylPREDNISolone sodium succinate (SOLU-MEDROL) 40 mg/mL injection 40 mg (40 mg Intravenous Not Given 05/17/18 1308)  albuterol (PROVENTIL) (2.5 MG/3ML) 0.083% nebulizer solution 2.5 mg (has no administration in time range)  ipratropium-albuterol (DUONEB) 0.5-2.5 (3) MG/3ML nebulizer solution 3 mL (has no administration in time range)  oseltamivir (TAMIFLU) capsule 75 mg (75 mg Oral Not Given 05/17/18 1308)  levofloxacin (LEVAQUIN) IVPB 750 mg (has no administration in time range)  sodium chloride 0.9 % bolus 500 mL (0 mLs Intravenous Stopped 05/17/18 1238)  acetaminophen (TYLENOL) tablet 1,000 mg (1,000 mg Oral Given 05/17/18 1131)  oseltamivir (TAMIFLU) capsule 30 mg (30 mg Oral Given 05/17/18 1241)  methylPREDNISolone sodium succinate (SOLU-MEDROL) 125 mg/2 mL injection 80 mg (80 mg Intravenous Given 05/17/18 1203)  ipratropium-albuterol (DUONEB) 0.5-2.5 (3) MG/3ML nebulizer solution 3 mL (3 mLs Nebulization Given 05/17/18 1219)  iopamidol (ISOVUE-370) 76 % injection 75 mL (75 mLs Intravenous Contrast Given 05/17/18 1250)    Mobility walks with person assist

## 2018-05-17 NOTE — Progress Notes (Signed)

## 2018-05-17 NOTE — ED Notes (Signed)
Oral temp 100.2. EDP notified of increase.

## 2018-05-17 NOTE — Consult Note (Signed)
CODE SEPSIS - PHARMACY COMMUNICATION  **Broad Spectrum Antibiotics should be administered within 1 hour of Sepsis diagnosis**  Time Code Sepsis Called/Page Received: 1138  Antibiotics Ordered: Levaquin  Time of 1st antibiotic administration: 1214  Additional action taken by pharmacy: none required  If necessary, Name of Provider/Nurse Contacted: N/A    Dallie Piles ,PharmD Clinical Pharmacist  05/17/2018  12:19 PM

## 2018-05-17 NOTE — ED Notes (Signed)
Lactic Acid: 2.9.  Dr. Jacqualine Code notified of critical value.

## 2018-05-18 DIAGNOSIS — R0781 Pleurodynia: Secondary | ICD-10-CM | POA: Diagnosis not present

## 2018-05-18 DIAGNOSIS — A419 Sepsis, unspecified organism: Secondary | ICD-10-CM | POA: Diagnosis not present

## 2018-05-18 MED ORDER — APIXABAN 5 MG PO TABS: 5.0000 mg | ORAL_TABLET | Freq: Two times a day (BID) | ORAL | 0 refills | Status: DC

## 2018-05-18 MED ORDER — AZITHROMYCIN 250 MG PO TABS: ORAL_TABLET | ORAL | 0 refills | Status: AC

## 2018-05-18 MED ORDER — OCUVITE-LUTEIN PO CAPS
1.0000 | ORAL_CAPSULE | Freq: Every day | ORAL | Status: DC
  Filled 2018-05-18: qty 1

## 2018-05-18 MED ORDER — OSELTAMIVIR PHOSPHATE 75 MG PO CAPS: 75.0000 mg | ORAL_CAPSULE | Freq: Two times a day (BID) | ORAL | 0 refills | Status: AC

## 2018-05-18 MED ORDER — ENSURE ENLIVE PO LIQD: 237.0000 mL | Freq: Two times a day (BID) | ORAL | Status: DC

## 2018-05-18 MED ORDER — IRON 325 (65 FE) MG PO TABS: 1.0000 | ORAL_TABLET | ORAL | Status: DC

## 2018-05-18 NOTE — Progress Notes (Signed)
Initial Nutrition Assessment  DOCUMENTATION CODES:   Not applicable  INTERVENTION:  Ensure Enlive po BID, each supplement provides 350 kcal and 20 grams of protein (strawberry) MVI Liberalize diet to increase PO intake   NUTRITION DIAGNOSIS:   Increased nutrient needs related to acute illness(COPD exacerbation; Flu) as evidenced by energy intake < or equal to 50% for > or equal to 5 days, estimated needs.   GOAL:   Patient will meet greater than or equal to 90% of their needs   MONITOR:   PO intake, Supplement acceptance, Diet advancement, Weight trends, Labs, I & O's  REASON FOR ASSESSMENT:   Consult Assessment of nutrition requirement/status  ASSESSMENT:  77 year old female with known history of hypoxic resp failure on 3L O2, COPD, HTN, HLD, B12 deficiency, bladder incontinence, left eye blindness, cervical spine fracture, GERD, pulmonary embolism, stroke, appendectomy, bladder surgery, ostomy, total hip arthroplasty admitted after recent history of pneumonia with increasing SOB and positive Flu test  Patient talking on her cell phone and RD patiently waited for her to finish her conversation. Patient reports poor PO during her stay d/t dislike of facility food. At home, patient stated she had a great appetite and loves to go to the fish house for tuna, baked pots, slaw, hushpuppies and the bbq joint where she eats ribs with nothing but her fingers. Patients daughter prepares meals for her and reports 1 meal/day and 1-2 Boost is typical.   Patient reports wearing dentures when she is at home and denies any chewing/swallowing difficulties w/out them at meal time.   Patient recalls UBW of 180lb approximately 6 months ago and attributes wt losses to recent hospitalizations stating that she doesn't eat when she is in the hospital because the food is nasty so she saves up for when she can go home.   Patient agreeable to Ensure and requested strawberry.   12/05: [facility]  discharge  Medications reviewed and include: zithromax, oscal with D, vit D (5000 units daily) methylprednisolone, tamiflu, protonix  Labs: Glucose 65 (L) Lab Results  Component Value Date   HGBA1C 4.9 09/19/2017     NUTRITION - FOCUSED PHYSICAL EXAM:    Most Recent Value  Orbital Region  Mild depletion  Upper Arm Region  No depletion  Thoracic and Lumbar Region  No depletion  Buccal Region  Mild depletion  Temple Region  Mild depletion  Clavicle Bone Region  No depletion  Clavicle and Acromion Bone Region  No depletion  Scapular Bone Region  No depletion  Dorsal Hand  Mild depletion  Patellar Region  No depletion  Anterior Thigh Region  No depletion  Posterior Calf Region  No depletion  Edema (RD Assessment)  None  Hair  Reviewed  Eyes  Reviewed  Mouth  Reviewed [wears dentures]  Skin  Reviewed  Nails  Reviewed       Diet Order:   Diet Order            Diet - low sodium heart healthy        Diet Heart Room service appropriate? Yes; Fluid consistency: Thin  Diet effective now              EDUCATION NEEDS:   No education needs have been identified at this time  Skin:  Skin Assessment: Reviewed RN Assessment  Last BM:  05/17/2018; Type 7; Brown; Large  Height:   Ht Readings from Last 1 Encounters:  05/17/18 5\' 4"  (1.626 m)    Weight: 166lbs  Wt Readings from Last 1 Encounters:  05/17/18 75.6 kg    Ideal Body Weight:  54.5 kg 119.9lbs  BMI:  Body mass index is 28.6 kg/m.  Estimated Nutritional Needs:   Kcal:  1545-1730 (MSJ 1.25-1.4)  Protein:  91-113g  Fluid:  </=1.7L/day    Lajuan Lines, RD, LDN  After Hours/Weekend Pager: [phone]

## 2018-05-18 NOTE — Telephone Encounter (Signed)
Called and spoke to daughter understanding verbalized nothing further needed at this time.

## 2018-05-18 NOTE — Telephone Encounter (Signed)
Called and spoke to daughter. Prescription placed up front for pick up. Understanding verbalized nothing further needed at this time.

## 2018-05-18 NOTE — Discharge Summary (Signed)
[facility] [place] [facility]   PATIENT NAME: [name]    MR#:  [id number]  DATE OF BIRTH:  04/17/1941  DATE OF ADMISSION:  05/17/2018 ADMITTING PHYSICIAN: Ramonita Lab, MD  DATE OF DISCHARGE: 05/18/2018  PRIMARY CARE PHYSICIAN: Emi Belfast, FNP    ADMISSION DIAGNOSIS:  Pleuritic chest pain [R07.81] Influenza A [J10.1] Sepsis, due to unspecified organism, unspecified whether acute organ dysfunction present (HCC) [A41.9]  DISCHARGE DIAGNOSIS:  Active Problems:   COPD with acute exacerbation (HCC)   SECONDARY DIAGNOSIS:   Past Medical History:  Diagnosis Date  . Acute postoperative pain 02/03/2018  . Anemia   . B12 deficiency   . Bacteremia 10/07/2014  . Bladder incontinence   . Blind left eye   . Cancer (HCC)    skin cancer   . Cataract   . Cervical spine fracture (HCC)   . Chest pain 07/29/2013  . Compression fracture    C7,  upper T spine -compression fx  . COPD (chronic obstructive pulmonary disease) (HCC)   . DDD (degenerative disc disease), lumbar   . Depression    major  . Diffuse myofascial pain syndrome 03/24/2015  . Dyspnea    at times- when activty  . Fever 10/05/2014  . GERD (gastroesophageal reflux disease)   . Hiatal hernia   . Hyperlipidemia   . Hypertension   . 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 Macular degeneration   . On home oxygen therapy    "3L; all the time" (04/30/2018)  . Osteoarthritis   . Osteoporosis   . Pneumonia    years ago  . Pulmonary embolism (HCC) 04/30/2018  . Reactive airway disease   . Rupture of bowel (HCC)   . Sepsis (HCC) 10/08/2014  . Stroke [facility])    "mini stroke"- balance and memory issue  . Stroke (HCC)   . Wrist pain, acute 09/10/2012    HOSPITAL COURSE:   77 year old female with a history of chronic hypoxic respiratory failure on 2 L oxygen due to COPD, essential hypertension and history of pulmonary emboli with recent treatment for pneumonia who presented with  shortness of breath.  1.  Acute on chronic hypoxic respiratory distress from influenza A exacerbated by underlying/resolving pneumonia Patient is on baseline oxygen of 2 L. Respiratory wise patient is doing well. She does not need steroids upon discharge she has no wheezing.  2.  Influenza A: Patient will continue Tamiflu for total 5 days  3.  Recent pneumonia: Patient will be discharged on azithromycin  4.  History of PE: Continue Eliquis     DISCHARGE CONDITIONS AND DIET:   Stable for discharge on regular diet  CONSULTS OBTAINED:    DRUG ALLERGIES:   Allergies  Allergen Reactions  . Ampicillin Anaphylaxis, Nausea Only and Swelling    SEVERE HEADACHE MUSCLE CRAMPS ANGIOEDEMA THRUSH PATIENT HAS HAD A PCN REACTION WITH IMMEDIATE RASH, FACIAL/TONGUE/THROAT SWELLING, SOB, OR LIGHTHEADEDNESS WITH HYPOTENSION:  #  #  YES  #  #  Has patient had a PCN reaction causing severe rash involving mucus membranes or skin necrosis: No Has patient had a PCN reaction that required hospitalization:No Has patient had a PCN reaction occurring within the last 10 years: No.  . Ampicillin-Sulbactam Sodium Anaphylaxis and Other (See Comments)    SEVERE HEADACHE MUSCLE CRAMPS ANGIOEDEMA THRUSH PATIENT HAS HAD A PCN REACTION WITH IMMEDIATE RASH, FACIAL/TONGUE/THROAT SWELLING, SOB, OR LIGHTHEADEDNESS WITH HYPOTENSION: # # YES # # Has patient had a PCN reaction causing  severe rash involving mucus membranes or skin necrosis: No Has patient had a PCN reaction that required hospitalization:No Has patient had a PCN reaction occurring within the last 10 years: No   . Ambien [Zolpidem Tartrate] Nausea And Vomiting and Other (See Comments)    HALLUCINATIONS SWEATING  . Flexeril [Cyclobenzaprine] Other (See Comments)    EXTRAPYRAMIDAL MOVEMENT INVOLUNTARY MUSCLE JERKING  . Tape Itching, Rash and Other (See Comments)    Paper tape only. Adhesive tape=itching/burning/rash  . Toradol [Ketorolac  Tromethamine] Nausea Only and Other (See Comments)    HEADACHE  BACKACHE  . Sulfamethoxazole-Trimethoprim     UNSPECIFIED REACTION   . Zolpidem     Hallucinations  . Cephalexin Rash and Other (See Comments)    HEADACHES  . Tramadol Rash and Other (See Comments)    HEADACHE     DISCHARGE MEDICATIONS:   Allergies as of 05/18/2018      Reactions   Ampicillin Anaphylaxis, Nausea Only, Swelling   SEVERE HEADACHE MUSCLE CRAMPS ANGIOEDEMA THRUSH PATIENT HAS HAD A PCN REACTION WITH IMMEDIATE RASH, FACIAL/TONGUE/THROAT SWELLING, SOB, OR LIGHTHEADEDNESS WITH HYPOTENSION:  #  #  YES  #  #  Has patient had a PCN reaction causing severe rash involving mucus membranes or skin necrosis: No Has patient had a PCN reaction that required hospitalization:No Has patient had a PCN reaction occurring within the last 10 years: No.   Ampicillin-sulbactam Sodium Anaphylaxis, Other (See Comments)   SEVERE HEADACHE MUSCLE CRAMPS ANGIOEDEMA THRUSH PATIENT HAS HAD A PCN REACTION WITH IMMEDIATE RASH, FACIAL/TONGUE/THROAT SWELLING, SOB, OR LIGHTHEADEDNESS WITH HYPOTENSION: # # YES # # Has patient had a PCN reaction causing severe rash involving mucus membranes or skin necrosis: No Has patient had a PCN reaction that required hospitalization:No Has patient had a PCN reaction occurring within the last 10 years: No   Ambien [zolpidem Tartrate] Nausea And Vomiting, Other (See Comments)   HALLUCINATIONS SWEATING   Flexeril [cyclobenzaprine] Other (See Comments)   EXTRAPYRAMIDAL MOVEMENT INVOLUNTARY MUSCLE JERKING   Tape Itching, Rash, Other (See Comments)   Paper tape only. Adhesive tape=itching/burning/rash   Toradol [ketorolac Tromethamine] Nausea Only, Other (See Comments)   HEADACHE  BACKACHE   Sulfamethoxazole-trimethoprim    UNSPECIFIED REACTION    Zolpidem    Hallucinations   Cephalexin Rash, Other (See Comments)   HEADACHES   Tramadol Rash, Other (See Comments)   HEADACHE       Medication  List    STOP taking these medications   levofloxacin 750 MG tablet Commonly known as:  LEVAQUIN   metoprolol tartrate 25 MG tablet Commonly known as:  LOPRESSOR     TAKE these medications   albuterol 108 (90 Base) MCG/ACT inhaler Commonly known as:  PROVENTIL HFA;VENTOLIN HFA Inhale 2 puffs into the lungs every 6 (six) hours as needed for wheezing or shortness of breath.   albuterol 1.25 MG/3ML nebulizer solution Commonly known as:  ACCUNEB Take 1 ampule by nebulization every 6 (six) hours as needed for wheezing.   alendronate 70 MG tablet Commonly known as:  FOSAMAX Take 70 mg by mouth every Monday. Take with a full glass of water on an empty stomach.   ALPRAZolam 1 MG tablet Commonly known as:  XANAX Take 1 tablet (1 mg total) by mouth at bedtime as needed for sleep.   amLODipine 5 MG tablet Commonly known as:  NORVASC Take 5 mg by mouth daily.   apixaban 5 MG Tabs tablet Commonly known as:  ELIQUIS Take 1 tablet (5  mg total) by mouth 2 (two) times daily. Start after 10mg  twice day dosing What changed:    additional instructions  Another medication with the same name was removed. Continue taking this medication, and follow the directions you see here.   azithromycin 250 MG tablet Commonly known as:  ZITHROMAX Take 1 tablet daily starting December 24 for 4 days. Start taking on:  May 19, 2018   benzonatate 200 MG capsule Commonly known as:  TESSALON Take 1 capsule (200 mg total) by mouth 2 (two) times daily as needed for cough.   BIOFREEZE EX Apply 1 application topically daily as needed (muscle pain).   Calcium 500-100 MG-UNIT Chew Chew 1 tablet by mouth daily.   citalopram 20 MG tablet Commonly known as:  CELEXA Take 20 mg by mouth daily.   docusate sodium 100 MG capsule Commonly known as:  COLACE Take 200 mg by mouth 2 (two) times daily as needed for moderate constipation.   esomeprazole 40 MG capsule Commonly known as:  NEXIUM Take 40 mg by  mouth daily.   fluticasone 220 MCG/ACT inhaler Commonly known as:  FLOVENT HFA Inhale 1 puff into the lungs 2 (two) times daily.   Iron 325 (65 Fe) MG Tabs Take 1 tablet (325 mg total) by mouth 2 (two) times a week.   Krill Oil 500 MG Caps Take 500 mg by mouth daily.   LINZESS 290 MCG Caps capsule Generic drug:  linaclotide Take 290 mcg by mouth daily.   lovastatin 10 MG tablet Commonly known as:  MEVACOR Take 10 mg by mouth daily.   Medical Compression Socks Misc 1 each by Does not apply route daily as needed.   meloxicam 15 MG tablet Commonly known as:  MOBIC Take 15 mg by mouth daily.   MUCINEX 600 MG 12 hr tablet Generic drug:  guaiFENesin Take 600 mg by mouth 2 (two) times daily as needed for cough.   ondansetron 4 MG disintegrating tablet Commonly known as:  ZOFRAN ODT Take 1 tablet (4 mg total) by mouth every 8 (eight) hours as needed for nausea or vomiting.   oseltamivir 75 MG capsule Commonly known as:  TAMIFLU Take 1 capsule (75 mg total) by mouth 2 (two) times daily for 4 days.   polyethylene glycol packet Commonly known as:  MIRALAX / GLYCOLAX Take 17 g by mouth daily as needed (for constipation.).   pregabalin 75 MG capsule Commonly known as:  LYRICA Take 1 capsule (75 mg total) by mouth 2 (two) times daily.   traZODone 100 MG tablet Commonly known as:  DESYREL Take 100 mg by mouth at bedtime.   vitamin B-12 500 MCG tablet Commonly known as:  CYANOCOBALAMIN Take 500 mcg by mouth daily.   VITAMIN D HIGH POTENCY PO Take 5,000 Units by mouth daily.         Today   CHIEF COMPLAINT:  Patient wants to go home.  She reports that she is feeling 100% better.   VITAL SIGNS:  Blood pressure 121/67, pulse 82, temperature 98.6 F (37 C), temperature source Oral, resp. rate 19, height 5\' 4"  (1.626 m), weight 75.6 kg, SpO2 96 %.   REVIEW OF SYSTEMS:  Review of Systems  Constitutional: Negative.  Negative for chills, fever and malaise/fatigue.   HENT: Negative.  Negative for ear discharge, ear pain, hearing loss, nosebleeds and sore throat.   Eyes: Negative.  Negative for blurred vision and pain.  Respiratory: Negative.  Negative for cough, hemoptysis, shortness of breath and  wheezing.   Cardiovascular: Negative.  Negative for chest pain, palpitations and leg swelling.  Gastrointestinal: Negative.  Negative for abdominal pain, blood in stool, diarrhea, nausea and vomiting.  Genitourinary: Negative.  Negative for dysuria.  Musculoskeletal: Negative.  Negative for back pain.  Skin: Negative.   Neurological: Negative for dizziness, tremors, speech change, focal weakness, seizures and headaches.  Endo/Heme/Allergies: Negative.  Does not bruise/bleed easily.  Psychiatric/Behavioral: Negative.  Negative for depression, hallucinations and suicidal ideas.     PHYSICAL EXAMINATION:  GENERAL:  77 y.o.-year-old patient lying in the bed with no acute distress.  NECK:  Supple, no jugular venous distention. No thyroid enlargement, no tenderness.  LUNGS: Normal breath sounds bilaterally, no wheezing, rales,rhonchi  No use of accessory muscles of respiration.  CARDIOVASCULAR: S1, S2 normal. No murmurs, rubs, or gallops.  ABDOMEN: Soft, non-tender, non-distended. Bowel sounds present. No organomegaly or mass.  EXTREMITIES: No pedal edema, cyanosis, or clubbing.  PSYCHIATRIC: The patient is alert and oriented x 3.  SKIN: No obvious rash, lesion, or ulcer.   DATA REVIEW:   CBC Recent Labs  Lab 05/17/18 1037  WBC 10.7*  HGB 12.5  HCT 41.1  PLT 229    Chemistries  Recent Labs  Lab 05/17/18 1126  NA 139  K 3.6  CL 106  CO2 24  GLUCOSE 65*  BUN 7*  CREATININE 0.94  CALCIUM 8.9  AST 26  ALT 12  ALKPHOS 51  BILITOT 0.6    Cardiac Enzymes Recent Labs  Lab 05/17/18 1126  TROPONINI <0.03    Microbiology Results  @MICRORSLT48 @  RADIOLOGY:  Ct Angio Chest Pe W And/or Wo Contrast  Result Date: 05/17/2018 CLINICAL DATA:   Shortness of breath with nonproductive cough and nausea. Wheezing. EXAM: CT ANGIOGRAPHY CHEST WITH CONTRAST TECHNIQUE: Multidetector CT imaging of the chest was performed using the standard protocol during bolus administration of intravenous contrast. Multiplanar CT image reconstructions and MIPs were obtained to evaluate the vascular anatomy. CONTRAST:  75mL ISOVUE-370 IOPAMIDOL (ISOVUE-370) INJECTION 76% COMPARISON:  04/30/2018 and 10/23/2016 FINDINGS: Cardiovascular: Mild prominence of the heart unchanged. Minimal calcified plaque over the thoracic aorta. Mild stable prominence of the ascending thoracic aorta measuring 3.6 cm in AP diameter. Very minimal residual emboli visualized over the proximal right upper lobar pulmonary artery. Resolution of previously seen right lower lobar and left upper lobe pulmonary emboli. Mediastinum/Nodes: 1.1 cm precarinal lymph node. 1.2 cm subcarinal lymph node. 1 cm anterior right hilar lymph node. 1.1 cm posterior right hilar lymph node. These nodes are slightly larger, but likely reactive. Remaining mediastinal structures are unremarkable. Lungs/Pleura: Lungs are adequately inflated demonstrate patchy bilateral peripheral fibrotic change over the mid to lower lungs. Calcified granuloma over the posterior left upper lobe. Significant interval improvement of the previously seen bilateral patchy airspace process with minimal residual density over the posterior left upper lobe. Calcified granuloma left lower lobe. No effusion. Airways are unremarkable. Upper Abdomen: 1 cm hypodensity over the upper pole cortex left kidney unchanged likely a cyst. No acute findings. Musculoskeletal: Minimal soft tissue prominence/fluid adjacent the left sternal clavicular joint. Mild degenerate change of the spine. Remainder of the exam is unchanged. Review of the MIP images confirms the above findings. IMPRESSION: Interval improvement in known pulmonary emboli with minimal residual embolus over  the proximal right upper lobar pulmonary artery. Significant interval improvement of previously noted patchy bilateral airspace process with minimal residual opacification over the posterior left upper lob which may be resolving infection e. Chronic fibrotic change. Reactive  mediastinal nodes. Mild prominence of the ascending thoracic aorta measuring 3.6 cm in AP diameter. Recommend annual imaging followup by CTA or MRA. This recommendation follows 2010 ACCF/AHA/AATS/ACR/ASA/SCA/SCAI/SIR/STS/SVM Guidelines for the Diagnosis and Management of Patients with Thoracic Aortic Disease. Circulation.2010; 121: Z610-R604. Aortic Atherosclerosis (ICD10-I70.0). Mild soft tissue thickening/fluid adjacent the left sternoclavicular joint. May be due to infectious or inflammatory process. Electronically Signed   By: Elberta Fortis M.D.   On: 05/17/2018 13:39   Dg Chest Port 1 View  Result Date: 05/17/2018 CLINICAL DATA:  Shortness of breath, tachycardia, COPD, recent pneumonia and PE 04/30/2018 EXAM: PORTABLE CHEST 1 VIEW COMPARISON:  04/30/2018, 05/03/2018 FINDINGS: Persistent cardiomegaly with some improvement in patchy bilateral airspace opacities. However there is persistent diffuse interstitial prominence which may represent a component of residual edema. No large effusion or pneumothorax. Scoliosis of the spine noted. Exam is rotated to the right. IMPRESSION: Cardiomegaly with persistent interstitial prominence may represent chronic lung disease or mild residual edema pattern Improving patchy bilateral airspace process particularly in the lower lobes compatible with resolving pneumonia. Electronically Signed   By: Judie Petit.  Shick M.D.   On: 05/17/2018 10:57      Allergies as of 05/18/2018      Reactions   Ampicillin Anaphylaxis, Nausea Only, Swelling   SEVERE HEADACHE MUSCLE CRAMPS ANGIOEDEMA THRUSH PATIENT HAS HAD A PCN REACTION WITH IMMEDIATE RASH, FACIAL/TONGUE/THROAT SWELLING, SOB, OR LIGHTHEADEDNESS WITH  HYPOTENSION:  #  #  YES  #  #  Has patient had a PCN reaction causing severe rash involving mucus membranes or skin necrosis: No Has patient had a PCN reaction that required hospitalization:No Has patient had a PCN reaction occurring within the last 10 years: No.   Ampicillin-sulbactam Sodium Anaphylaxis, Other (See Comments)   SEVERE HEADACHE MUSCLE CRAMPS ANGIOEDEMA THRUSH PATIENT HAS HAD A PCN REACTION WITH IMMEDIATE RASH, FACIAL/TONGUE/THROAT SWELLING, SOB, OR LIGHTHEADEDNESS WITH HYPOTENSION: # # YES # # Has patient had a PCN reaction causing severe rash involving mucus membranes or skin necrosis: No Has patient had a PCN reaction that required hospitalization:No Has patient had a PCN reaction occurring within the last 10 years: No   Ambien [zolpidem Tartrate] Nausea And Vomiting, Other (See Comments)   HALLUCINATIONS SWEATING   Flexeril [cyclobenzaprine] Other (See Comments)   EXTRAPYRAMIDAL MOVEMENT INVOLUNTARY MUSCLE JERKING   Tape Itching, Rash, Other (See Comments)   Paper tape only. Adhesive tape=itching/burning/rash   Toradol [ketorolac Tromethamine] Nausea Only, Other (See Comments)   HEADACHE  BACKACHE   Sulfamethoxazole-trimethoprim    UNSPECIFIED REACTION    Zolpidem    Hallucinations   Cephalexin Rash, Other (See Comments)   HEADACHES   Tramadol Rash, Other (See Comments)   HEADACHE       Medication List    STOP taking these medications   levofloxacin 750 MG tablet Commonly known as:  LEVAQUIN   metoprolol tartrate 25 MG tablet Commonly known as:  LOPRESSOR     TAKE these medications   albuterol 108 (90 Base) MCG/ACT inhaler Commonly known as:  PROVENTIL HFA;VENTOLIN HFA Inhale 2 puffs into the lungs every 6 (six) hours as needed for wheezing or shortness of breath.   albuterol 1.25 MG/3ML nebulizer solution Commonly known as:  ACCUNEB Take 1 ampule by nebulization every 6 (six) hours as needed for wheezing.   alendronate 70 MG tablet Commonly  known as:  FOSAMAX Take 70 mg by mouth every Monday. Take with a full glass of water on an empty stomach.   ALPRAZolam 1 MG  tablet Commonly known as:  XANAX Take 1 tablet (1 mg total) by mouth at bedtime as needed for sleep.   amLODipine 5 MG tablet Commonly known as:  NORVASC Take 5 mg by mouth daily.   apixaban 5 MG Tabs tablet Commonly known as:  ELIQUIS Take 1 tablet (5 mg total) by mouth 2 (two) times daily. Start after 10mg  twice day dosing What changed:    additional instructions  Another medication with the same name was removed. Continue taking this medication, and follow the directions you see here.   azithromycin 250 MG tablet Commonly known as:  ZITHROMAX Take 1 tablet daily starting December 24 for 4 days. Start taking on:  May 19, 2018   benzonatate 200 MG capsule Commonly known as:  TESSALON Take 1 capsule (200 mg total) by mouth 2 (two) times daily as needed for cough.   BIOFREEZE EX Apply 1 application topically daily as needed (muscle pain).   Calcium 500-100 MG-UNIT Chew Chew 1 tablet by mouth daily.   citalopram 20 MG tablet Commonly known as:  CELEXA Take 20 mg by mouth daily.   docusate sodium 100 MG capsule Commonly known as:  COLACE Take 200 mg by mouth 2 (two) times daily as needed for moderate constipation.   esomeprazole 40 MG capsule Commonly known as:  NEXIUM Take 40 mg by mouth daily.   fluticasone 220 MCG/ACT inhaler Commonly known as:  FLOVENT HFA Inhale 1 puff into the lungs 2 (two) times daily.   Iron 325 (65 Fe) MG Tabs Take 1 tablet (325 mg total) by mouth 2 (two) times a week.   Krill Oil 500 MG Caps Take 500 mg by mouth daily.   LINZESS 290 MCG Caps capsule Generic drug:  linaclotide Take 290 mcg by mouth daily.   lovastatin 10 MG tablet Commonly known as:  MEVACOR Take 10 mg by mouth daily.   Medical Compression Socks Misc 1 each by Does not apply route daily as needed.   meloxicam 15 MG tablet Commonly  known as:  MOBIC Take 15 mg by mouth daily.   MUCINEX 600 MG 12 hr tablet Generic drug:  guaiFENesin Take 600 mg by mouth 2 (two) times daily as needed for cough.   ondansetron 4 MG disintegrating tablet Commonly known as:  ZOFRAN ODT Take 1 tablet (4 mg total) by mouth every 8 (eight) hours as needed for nausea or vomiting.   oseltamivir 75 MG capsule Commonly known as:  TAMIFLU Take 1 capsule (75 mg total) by mouth 2 (two) times daily for 4 days.   polyethylene glycol packet Commonly known as:  MIRALAX / GLYCOLAX Take 17 g by mouth daily as needed (for constipation.).   pregabalin 75 MG capsule Commonly known as:  LYRICA Take 1 capsule (75 mg total) by mouth 2 (two) times daily.   traZODone 100 MG tablet Commonly known as:  DESYREL Take 100 mg by mouth at bedtime.   vitamin B-12 500 MCG tablet Commonly known as:  CYANOCOBALAMIN Take 500 mcg by mouth daily.   VITAMIN D HIGH POTENCY PO Take 5,000 Units by mouth daily.          Management plans discussed with the patient and she is in agreement. Stable for discharge home  Patient should follow up with pcp  CODE STATUS:  Code Status History    Date Active Date Inactive Code Status Order ID Comments User Context   04/30/2018 1855 05/04/2018 1931 Full Code [id number]  Carron Curie, MD Inpatient  04/20/2018 1315 04/25/2018 1827 Full Code [id number]  Elyse Hsu, MD ED   10/27/2017 0058 10/30/2017 1606 Full Code [id number]  Hillary Bow, DO ED   09/30/2017 1752 10/02/2017 2148 Full Code [id number]  Albina Billet III, PA-C Inpatient   08/24/2017 1129 08/25/2017 1713 Full Code [id number]  Pieter Partridge, MD Inpatient   09/13/2016 0759 09/16/2016 2102 Full Code [id number]  Matilde Bash Inpatient   05/22/2015 1903 05/24/2015 1935 Full Code [id number]  Richarda Overlie, MD ED   10/05/2014 2050 10/06/2014 1628 Full Code [id number]  Elgergawy, Leana Roe, MD Inpatient   07/30/2013 0040 07/30/2013 1844 Full Code  [id number]  Eduard Clos, MD Inpatient      TOTAL TIME TAKING CARE OF THIS PATIENT: 38 minutes.    Note: This dictation was prepared with Dragon dictation along with smaller phrase technology. Any transcriptional errors that result from this process are unintentional.  Aaryav Hopfensperger M.D on 05/18/2018 at 11:36 AM  Between 7am to 6pm - Pager - [phone] After 6pm go to [email or web address] - Social research officer, government  [facility]  Office  [phone]  CC: Primary care physician; Emi Belfast, FNP

## 2018-05-18 NOTE — Progress Notes (Signed)
Discharged to home with her daughter.  Prescriptions provided and follow up appt made.

## 2018-05-18 NOTE — Care Management Note (Signed)
Case Management Note  Patient Details  Name: [name] MRN: [id number] Date of Birth: 1940-11-02   Patient to discharge home today. Patient lives at home with daughter [name].  Patient open with [facility].  Patient has chronic O2 through [facility].  Patient has rollator, BSC, toilet riser, and WC in the home. Daughter to transport at discharge today, and bring portable tank.  Daughter request to stay with [facility], However would like to switch therapist.  Helene Kelp with [facility] notified of this request, and resumption orders.  Requested COPD program through [facility].   Subjective/Objective:                    Action/Plan:   Expected Discharge Date:  05/18/18               Expected Discharge Plan:  [place]  In-House Referral:     Discharge planning Services  CM Consult  Post Acute Care Choice:  Resumption of Svcs/PTA Provider Choice offered to:     DME Arranged:    DME Agency:     HH Arranged:    [place] Agency:  [facility] [employer]  Status of Service:  Completed, signed off  If discussed at [employer], dates discussed:    Additional Comments:  Beverly Sessions, RN 05/18/2018, 2:04 PM

## 2018-05-18 NOTE — Progress Notes (Signed)
OT Cancellation Note  Patient Details Name: DANICA CAMARENA MRN: [id number] DOB: [birth year]   Cancelled Treatment:    Reason Eval/Treat Not Completed: Other (comment) Order received, chart reviewed. Pt noted with DC summary. Spoke to RN who was preparing to go over DC paperwork with Pt. Pt unavailable for OT evaluation. Will re-attempt as appropriate.   Doryan Bahl, OTR/L 05/18/2018, 12:28 PM

## 2018-05-19 ENCOUNTER — Telehealth: Payer: Self-pay

## 2018-05-19 LAB — HIV ANTIBODY (ROUTINE TESTING W REFLEX): HIV Screen 4th Generation wRfx: NONREACTIVE

## 2018-05-19 MED ORDER — GUAIFENESIN-CODEINE 100-10 MG/5ML PO SYRP: 5.0000 mL | ORAL_SOLUTION | Freq: Three times a day (TID) | ORAL | 0 refills | Status: DC | PRN

## 2018-05-19 NOTE — Telephone Encounter (Signed)
Spoken and notified patient's daughter of [name] comments. Patient's daughter verbalized understanding.

## 2018-05-19 NOTE — Telephone Encounter (Signed)
Please review concern area in the TCM call below for Debbie. Thank you.

## 2018-05-19 NOTE — Telephone Encounter (Signed)
Transition Care Management Follow-up Telephone Call Patient had trouble   Date discharged? 05/18/2018   How have you been since you were released from the hospital? Coughing a lot. Tessalon Perles not really helping. Per daughter. Not really feeling any better, feels about the same  Do you understand why you were in the hospital? Yes  Do you understand the discharge instructions? Yes   Where were you discharged to? Home   Items Reviewed:  Medications reviewed:yes  Allergies reviewed: yes  Dietary changes reviewed: yes-heart healthy  Referrals reviewed: yes-just to follow up with PCP.   Functional Questionnaire:   Activities of Daily Living (ADLs):   She states they are independent in the following: none at this time. States they require assistance with the following: needs assistance with everything right now, ambulation, feeding, bathing, hygiene, dressing.   Any transportation issues/concerns?: Not so far.   Any patient concerns? Yes-Coughing a lot. Tessalon Perles not really helping. Per daughter. Cough syrup with codeine that she was given in the past usually helps better. She is taking Tamiflu. Feels about the same as she was when she was at the hospital     Confirmed importance and date/time of follow-up visits scheduled Yes  Provider Appointment booked with Clarene Reamer, NP on 05/29/2018  Confirmed with patient if condition begins to worsen call PCP or go to the ER.  Patient was given the office number and encouraged to call back with question or concerns.  : yes

## 2018-05-19 NOTE — Telephone Encounter (Signed)
Please notify patient that I will send in a prescription for cough medicine with codeine.  Have her hold her Mucinex for now as the cough suppressant has Mucinex.  She can take 5 mils by mouth 3 times daily as needed for cough.  Caution as this may make her drowsy.

## 2018-05-22 LAB — CULTURE, BLOOD (ROUTINE X 2)
Culture: NO GROWTH
Culture: NO GROWTH
Special Requests: ADEQUATE

## 2018-05-25 ENCOUNTER — Emergency Department: Payer: Medicare Other

## 2018-05-25 ENCOUNTER — Emergency Department
Admission: EM | Admit: 2018-05-25 | Discharge: 2018-05-25 | Disposition: A | Discharge status: Home / Self Care | Payer: Medicare Other | Attending: Emergency Medicine | Admitting: Emergency Medicine

## 2018-05-25 ENCOUNTER — Other Ambulatory Visit: Payer: Self-pay

## 2018-05-25 DIAGNOSIS — I129 Hypertensive chronic kidney disease with stage 1 through stage 4 chronic kidney disease, or unspecified chronic kidney disease: Secondary | ICD-10-CM | POA: Diagnosis not present

## 2018-05-25 DIAGNOSIS — Z96653 Presence of artificial knee joint, bilateral: Secondary | ICD-10-CM | POA: Diagnosis not present

## 2018-05-25 DIAGNOSIS — Y929 Unspecified place or not applicable: Secondary | ICD-10-CM | POA: Diagnosis not present

## 2018-05-25 DIAGNOSIS — Z85828 Personal history of other malignant neoplasm of skin: Secondary | ICD-10-CM | POA: Diagnosis not present

## 2018-05-25 DIAGNOSIS — Y998 Other external cause status: Secondary | ICD-10-CM | POA: Diagnosis not present

## 2018-05-25 DIAGNOSIS — F329 Major depressive disorder, single episode, unspecified: Secondary | ICD-10-CM | POA: Insufficient documentation

## 2018-05-25 DIAGNOSIS — M25551 Pain in right hip: Secondary | ICD-10-CM | POA: Diagnosis not present

## 2018-05-25 DIAGNOSIS — Z9049 Acquired absence of other specified parts of digestive tract: Secondary | ICD-10-CM | POA: Insufficient documentation

## 2018-05-25 DIAGNOSIS — F419 Anxiety disorder, unspecified: Secondary | ICD-10-CM | POA: Diagnosis not present

## 2018-05-25 DIAGNOSIS — W0110XA Fall on same level from slipping, tripping and stumbling with subsequent striking against unspecified object, initial encounter: Secondary | ICD-10-CM | POA: Insufficient documentation

## 2018-05-25 DIAGNOSIS — J449 Chronic obstructive pulmonary disease, unspecified: Secondary | ICD-10-CM | POA: Diagnosis not present

## 2018-05-25 DIAGNOSIS — Y9389 Activity, other specified: Secondary | ICD-10-CM | POA: Diagnosis not present

## 2018-05-25 DIAGNOSIS — Z7901 Long term (current) use of anticoagulants: Secondary | ICD-10-CM | POA: Insufficient documentation

## 2018-05-25 DIAGNOSIS — Z79899 Other long term (current) drug therapy: Secondary | ICD-10-CM | POA: Insufficient documentation

## 2018-05-25 DIAGNOSIS — Z8673 Personal history of transient ischemic attack (TIA), and cerebral infarction without residual deficits: Secondary | ICD-10-CM | POA: Insufficient documentation

## 2018-05-25 DIAGNOSIS — W19XXXA Unspecified fall, initial encounter: Secondary | ICD-10-CM

## 2018-05-25 DIAGNOSIS — S0990XA Unspecified injury of head, initial encounter: Secondary | ICD-10-CM | POA: Insufficient documentation

## 2018-05-25 DIAGNOSIS — N183 Chronic kidney disease, stage 3 (moderate): Secondary | ICD-10-CM | POA: Diagnosis not present

## 2018-05-25 DIAGNOSIS — Z87891 Personal history of nicotine dependence: Secondary | ICD-10-CM | POA: Diagnosis not present

## 2018-05-25 DIAGNOSIS — Z96651 Presence of right artificial knee joint: Secondary | ICD-10-CM | POA: Diagnosis not present

## 2018-05-25 DIAGNOSIS — M25561 Pain in right knee: Secondary | ICD-10-CM | POA: Diagnosis not present

## 2018-05-25 MED ORDER — OXYCODONE-ACETAMINOPHEN 5-325 MG PO TABS
1.0000 | ORAL_TABLET | Freq: Once | ORAL | Status: AC
  Administered 2018-05-25: 1 via ORAL
  Filled 2018-05-25: qty 1

## 2018-05-25 NOTE — ED Triage Notes (Addendum)
Pt comes via [facility] with c/o mechanical fall. Pt states she tripped and fell over her walker on wheels.  Pt denies LOC. Pt states she did hit her head on the back side. VSS  EMS reports pt has hx of right knee replacement and right hip. Pt wears 2L O2 chronically.  Pt is A&OX4.

## 2018-05-25 NOTE — ED Provider Notes (Signed)
[facility] Emergency Department Provider Note  ____________________________________________   I have reviewed the triage vital signs and the nursing notes. Where available I have reviewed prior notes and, if possible and indicated, outside hospital notes.    HISTORY  Chief Complaint Fall    HPI [name] is a 77 y.o. female well-known to this facility for multiple prior visits mostly for hip pain although recent PE was discovered, presents today complaining of 2 things.  The first that she had a non-syncopal fall.  She got tangled up in her walker and fell and bumped her head did not pass out no seizure activity, no chest pain or shortness of breath.  Does not have a significant headache but is recently started on blood thinners because of her PEs.  Patient also states that her chronic pain in her right hip, which is artificial in her right knee, which is artificial, is exacerbated by her fall.  No numbness or weakness.  Has been able to ambulate nothing makes it better nothing makes worse no other alleviating or aggravating symptoms, no other prior treatment.  Past Medical History:  Diagnosis Date  . Acute postoperative pain 02/03/2018  . Anemia   . B12 deficiency   . Bacteremia 10/07/2014  . Bladder incontinence   . Blind left eye   . Cancer ([place])    skin cancer   . Cataract   . Cervical spine fracture ([place])   . Chest pain 07/29/2013  . Compression fracture    C7,  upper T spine -compression fx  . COPD (chronic obstructive pulmonary disease) ([place])   . DDD (degenerative disc disease), lumbar   . Depression    major  . Diffuse myofascial pain syndrome 03/24/2015  . Dyspnea    at times- when activty  . Fever 10/05/2014  . GERD (gastroesophageal reflux disease)   . Hiatal hernia   . Hyperlipidemia   . Hypertension   . 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 Macular degeneration   . On home oxygen therapy    "3L; all the time"  (04/30/2018)  . Osteoarthritis   . Osteoporosis   . Pneumonia    years ago  . Pulmonary embolism ([place]) 04/30/2018  . Reactive airway disease   . Rupture of bowel ([place])   . Sepsis ([place]) 10/08/2014  . Stroke [facility])    "mini stroke"- balance and memory issue  . Stroke ([place])   . Wrist pain, acute 09/10/2012    Patient Active Problem List   Diagnosis Date Noted  . COPD with acute exacerbation ([place]) 05/17/2018  . Pressure injury of skin 05/04/2018  . PE (pulmonary thromboembolism) ([place]) 04/30/2018  . Acute metabolic encephalopathy   . Respiratory failure with hypoxia ([place]) 04/20/2018  . CAP (community acquired pneumonia) 04/20/2018  . Acute lower UTI 04/20/2018  . Chronic lower extremity pain (Right) 04/07/2018  . Abnormal MRI, lumbar spine 04/07/2018  . Lumbar facet hypertrophy (Multilevel) (Bilateral) 04/07/2018  . Lumbar foraminal stenosis (L3-4, L4-5) (Bilateral) 04/07/2018  . Annular tear of lumbar disc (L3-4, L4-5) 04/07/2018  . Acute right hip pain 02/26/2018  . Closed hip fracture, sequela (Right) 02/03/2018  . It band syndrome, right 12/18/2017  . History of hip replacement (Right) 12/18/2017  . Ileus ([place]) 10/27/2017  . Primary osteoarthritis of hip 09/30/2017  . History of small bowel obstruction 09/10/2017  . 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 SBO (small bowel obstruction) ([place]) 08/24/2017  . Abnormal x-ray  of pelvis 08/13/2017  . Other specified dorsopathies, sacral and sacrococcygeal region 08/04/2017  . Spondylosis without myelopathy or radiculopathy, lumbosacral region 07/17/2017  . Non-traumatic compression fracture of T3 thoracic vertebra, sequela 07/02/2017  . High risk medication use 07/02/2017  . At high risk for falls 07/02/2017  . Chronic sacroiliac joint pain (Right) 07/02/2017  . CKD (chronic kidney disease) stage 3, GFR 30-59 ml/min (HCC) 04/23/2017  . Chronic knee pain s/p total knee replacement (TKR) (Right) 04/02/2017  . DDD  (degenerative disc disease), lumbar 03/05/2017  . Chronic knee pain (Bilateral) (R>L) 03/05/2017  . Osteoarthritis 03/05/2017  . Chronic musculoskeletal pain 03/05/2017  . Neurogenic pain 03/05/2017  . Chronic myofascial pain 02/12/2017  . Lumbar facet syndrome (Bilateral) (L>R) 02/12/2017  . Long term prescription benzodiazepine use 02/12/2017  . Opiate use 02/12/2017  . Disorder of skeletal system 02/12/2017  . Pharmacologic therapy 02/12/2017  . Problems influencing health status 02/12/2017  . 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 NSAID long-term use 02/12/2017  . DDD (degenerative disc disease), cervical 11/11/2016  . Lumbar facet arthropathy (Bilateral) 11/11/2016  . 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 CAFL (chronic airflow limitation) ([place]) 11/07/2016  . Depression, major, in partial remission ([place]) 11/07/2016  . 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 Bed wetting 11/07/2016  . Long term current use of opiate analgesic 11/07/2016  . Long term prescription opiate use 11/07/2016  . Chronic pain syndrome 11/07/2016  . Chronic neck pain (Secondary Area of Pain) (Bilateral) (L>R) 11/07/2016  . Chronic hip pain (Right) 11/07/2016  . Rib pain 11/07/2016  . Age-related osteoporosis with current pathological fracture with routine healing 09/20/2016  . History of stroke 09/20/2016  . Major depression in remission ([place]) 09/20/2016  . Intertrochanteric fracture of femur, sequela (Right) 09/13/2016  . Fall   . Chronic shoulder pain (Bilateral) 08/12/2016  . Hx of total knee replacement (Bilateral) 04/04/2016  . Chronic shoulder pain (Left) 02/28/2016  . Hoarseness 01/12/2016  . Pharyngoesophageal dysphagia 01/12/2016  . Chronic low back pain [facility] Area of Pain) (Bilateral) (L>R) 10/26/2015  . 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 UTI (urinary  tract infection) 06/25/2015  . Syncope, non cardiac 05/22/2015  . Leukopenia 10/07/2014  . Thrombocytopenia ([place]) 10/07/2014  . Hypoxia 10/05/2014  . Anxiety 06/15/2014  . Apoplectic vertigo 06/15/2014  . Duodenogastric reflux 06/15/2014  . Benign essential HTN 06/15/2014  . Infection of the upper respiratory tract 06/15/2014  . Hypercholesteremia 07/30/2013  . HTN (hypertension) 07/30/2013  . GERD (gastroesophageal reflux disease) 07/30/2013  . Dizziness and giddiness 06/18/2013  . Memory loss 05/03/2013  . Chronic knee pain (Primary Area of Pain) (Right) 09/10/2012    Past Surgical History:  Procedure Laterality Date  . ABDOMINAL HYSTERECTOMY    . ABDOMINAL SURGERY    . APPENDECTOMY    . BLADDER SURGERY    . CATARACT EXTRACTION W/ INTRAOCULAR LENS  IMPLANT, BILATERAL Bilateral   . CHOLECYSTECTOMY    . COLON SURGERY    . COLOSTOMY REVERSAL    . INTRAMEDULLARY (IM) NAIL INTERTROCHANTERIC Right 09/13/2016   Procedure: INTRAMEDULLARY (IM) NAIL INTERTROCHANTRIC RIGHT;  Surgeon: Leandrew Koyanagi, MD;  Location: [place];  Service: Orthopedics;  Laterality: Right;  . JOINT REPLACEMENT Bilateral   . KNEE SURGERY    . OSTOMY    . RECTOCELE REPAIR    . TOE SURGERY Right    3rd  . TOTAL  HIP ARTHROPLASTY Right 09/30/2017   Procedure: RIGHT HIP IM NAIL REMOVAL WITH CONVERSION TO TOTAL HIP ARTHOPLASTY, anterior approach;  Surgeon: Renette Butters, MD;  Location: [place];  Service: Orthopedics;  Laterality: Right;    Prior to Admission medications   Medication Sig Start Date End Date Taking? Authorizing Provider  albuterol (ACCUNEB) 1.25 MG/3ML nebulizer solution Take 1 ampule by nebulization every 6 (six) hours as needed for wheezing.    [provider]  albuterol (PROVENTIL HFA;VENTOLIN HFA) 108 (90 BASE) MCG/ACT inhaler Inhale 2 puffs into the lungs every 6 (six) hours as needed for wheezing or shortness of breath.     [provider]  alendronate (FOSAMAX) 70 MG tablet Take  70 mg by mouth every Monday. Take with a full glass of water on an empty stomach.    [provider]  ALPRAZolam Duanne Moron) 1 MG tablet Take 1 tablet (1 mg total) by mouth at bedtime as needed for sleep. 05/15/18   [name], FNP  amLODipine (NORVASC) 5 MG tablet Take 5 mg by mouth daily.  04/06/13   [provider]  apixaban (ELIQUIS) 5 MG TABS tablet Take 1 tablet (5 mg total) by mouth 2 (two) times daily. Start after 10mg  twice day dosing 05/18/18 06/17/18  Bettey Costa, MD  benzonatate (TESSALON) 200 MG capsule Take 1 capsule (200 mg total) by mouth 2 (two) times daily as needed for cough. 05/04/18   Donne Hazel, MD  Calcium 500-100 MG-UNIT CHEW Chew 1 tablet by mouth daily.    [provider]  Cholecalciferol (VITAMIN D HIGH POTENCY PO) Take 5,000 Units by mouth daily.    [provider]  citalopram (CELEXA) 20 MG tablet Take 20 mg by mouth daily.  02/27/17   [provider]  docusate sodium (COLACE) 100 MG capsule Take 200 mg by mouth 2 (two) times daily as needed for moderate constipation.     [provider]  Elastic Bandages & Supports (MEDICAL COMPRESSION SOCKS) MISC 1 each by Does not apply route daily as needed. 05/15/18   [name], FNP  esomeprazole (NEXIUM) 40 MG capsule Take 40 mg by mouth daily.     [provider]  Ferrous Sulfate (IRON) 325 (65 Fe) MG TABS Take 1 tablet (325 mg total) by mouth 2 (two) times a week. 05/18/18   Bettey Costa, MD  fluticasone (FLOVENT HFA) 220 MCG/ACT inhaler Inhale 1 puff into the lungs 2 (two) times daily.    [provider]  guaiFENesin (MUCINEX) 600 MG 12 hr tablet Take 600 mg by mouth 2 (two) times daily as needed for cough.    [provider]  guaiFENesin-codeine (ROBITUSSIN AC) 100-10 MG/5ML syrup Take 5 mLs by mouth 3 (three) times daily as needed for cough. 05/19/18   Pleas Koch, NP  Krill Oil 500 MG CAPS Take 500 mg by mouth daily.      [provider]  linaclotide (LINZESS) 290 MCG CAPS capsule Take 290 mcg by mouth daily.    [provider]  lovastatin (MEVACOR) 10 MG tablet Take 10 mg by mouth daily. 09/22/14   [provider]  meloxicam (MOBIC) 15 MG tablet Take 15 mg by mouth daily.  05/23/16   [provider]  Menthol, Topical Analgesic, (BIOFREEZE EX) Apply 1 application topically daily as needed (muscle pain).    [provider]  ondansetron (ZOFRAN ODT) 4 MG disintegrating tablet Take 1 tablet (4 mg total) by mouth every 8 (  eight) hours as needed for nausea or vomiting. 02/26/18   Eula Listen, MD  polyethylene glycol (MIRALAX / GLYCOLAX) packet Take 17 g by mouth daily as needed (for constipation.).     [provider]  pregabalin (LYRICA) 75 MG capsule Take 1 capsule (75 mg total) by mouth 2 (two) times daily. 04/06/18 10/03/18  Milinda Pointer, MD  traZODone (DESYREL) 100 MG tablet Take 100 mg by mouth at bedtime.     [provider]  vitamin B-12 (CYANOCOBALAMIN) 500 MCG tablet Take 500 mcg by mouth daily.    [provider]    Allergies Ampicillin; Ampicillin-sulbactam sodium; Ambien [zolpidem tartrate]; Flexeril [cyclobenzaprine]; Tape; Toradol [ketorolac tromethamine]; Sulfamethoxazole-trimethoprim; Zolpidem; Cephalexin; and Tramadol  Family History  Problem Relation Age of Onset  . Heart failure Mother   . Heart attack Father     Social History Social History   Tobacco Use  . Smoking status: Former Smoker    Packs/day: 1.50    Years: 30.00    Pack years: 45.00    Types: E-cigarettes, Cigarettes    Last attempt to quit: 2013    Years since quitting: 6.9  . Smokeless tobacco: Never Used  Substance Use Topics  . Alcohol use: No    Alcohol/week: 0.0 standard drinks  . Drug use: No    Review of Systems Constitutional: No fever/chills Eyes: No visual changes. ENT: No sore throat. No stiff neck no neck  pain Cardiovascular: Denies chest pain. Respiratory: Denies shortness of breath. Gastrointestinal:   no vomiting.  No diarrhea.  No constipation. Genitourinary: Negative for dysuria. Musculoskeletal: Negative lower extremity swelling Skin: Negative for rash. Neurological: Negative for severe headaches, focal weakness or numbness.   ____________________________________________   PHYSICAL EXAM:  VITAL SIGNS: ED Triage Vitals  Enc Vitals Group     BP 05/25/18 1533 119/79     Pulse Rate 05/25/18 1531 69     Resp 05/25/18 1531 18     Temp 05/25/18 1531 98.6 F (37 C)     Temp src --      SpO2 05/25/18 1531 95 %     Weight 05/25/18 1530 166 lb 9.6 oz (75.6 kg)     Height 05/25/18 1530 5\' 4"  (1.626 m)     Head Circumference --      Peak Flow --      Pain Score 05/25/18 1530 10     Pain Loc --      Pain Edu? --      Excl. in [place]? --     Constitutional: Alert and oriented. Well appearing and in no acute distress.  She is laughing and joking with me she is in no distress Eyes: Conjunctivae are normal Head: Atraumatic HEENT: No congestion/rhinnorhea. Mucous membranes are moist.  Oropharynx non-erythematous Neck:   Nontender with no meningismus, no masses, no stridor Cardiovascular: Normal rate, regular rhythm. Grossly normal heart sounds.  Good peripheral circulation. Respiratory: Normal respiratory effort.  No retractions. Lungs CTAB. Abdominal: Soft and nontender. No distention. No guarding no rebound Back:  There is no focal tenderness or step off.  there is no midline tenderness there are no lesions noted. there is no CVA tenderness Musculoskeletal: There is tenderness to palpation to her right hip and right knee but no obvious deformity., no upper extremity tenderness. No joint effusions, no DVT signs strong distal pulses no edema Neurologic:  Normal speech and language. No gross focal neurologic deficits are appreciated.  Skin:  Skin is warm, dry and intact.  No rash  noted. Psychiatric: Mood and affect are normal. Speech and behavior are normal.  ____________________________________________   LABS (all labs ordered are listed, but only abnormal results are displayed)  Labs Reviewed - No data to display  Pertinent labs  results that were available during my care of the patient were reviewed by me and considered in my medical decision making (see chart for details). ____________________________________________  EKG  I personally interpreted any EKGs ordered by me or triage  ____________________________________________  RADIOLOGY  Pertinent labs & imaging results that were available during my care of the patient were reviewed by me and considered in my medical decision making (see chart for details). If possible, patient and/or family made aware of any abnormal findings.  No results found. ____________________________________________    PROCEDURES  Procedure(s) performed: None  Procedures  Critical Care performed: None  ____________________________________________   INITIAL IMPRESSION / ASSESSMENT AND PLAN / ED COURSE  Pertinent labs & imaging results that were available during my care of the patient were reviewed by me and considered in my medical decision making (see chart for details).  Here with a non-syncopal fall because she is on blood thinners and hit her head at the age of 49 obtain a CT scan although it would likely be negative.  We will also obtain imaging of her right hip and right knee as they hurt after this fall but again likely be negative.  We have extensively imaged this knee and hip in the past for similar complaints.  We will give her something for pain here she has intolerances to multiple different medications.  She is not infrequently here for similar complaints.  Of note, this is a non-syncopal event I do not think she requires a full work-up for her, she simply tripped on her walker and was otherwise in good shape  and her vital signs are reassuring    ____________________________________________   FINAL CLINICAL IMPRESSION(S) / ED DIAGNOSES  Final diagnoses:  None      This chart was dictated using voice recognition software.  Despite best efforts to proofread,  errors can occur which can change meaning.      Schuyler Amor, MD 05/25/18 [phone]

## 2018-05-29 ENCOUNTER — Ambulatory Visit (INDEPENDENT_AMBULATORY_CARE_PROVIDER_SITE_OTHER): Payer: Medicare Other | Admitting: Family Medicine

## 2018-05-29 ENCOUNTER — Encounter: Payer: Self-pay | Admitting: Family Medicine

## 2018-05-29 ENCOUNTER — Inpatient Hospital Stay: Payer: Medicare Other | Admitting: Family Medicine

## 2018-05-29 VITALS — BP 130/80 | HR 105 | Temp 98.4°F

## 2018-05-29 DIAGNOSIS — Z09 Encounter for follow-up examination after completed treatment for conditions other than malignant neoplasm: Secondary | ICD-10-CM

## 2018-05-29 DIAGNOSIS — M7918 Myalgia, other site: Secondary | ICD-10-CM

## 2018-05-29 DIAGNOSIS — J441 Chronic obstructive pulmonary disease with (acute) exacerbation: Secondary | ICD-10-CM

## 2018-05-29 DIAGNOSIS — G8929 Other chronic pain: Secondary | ICD-10-CM | POA: Diagnosis not present

## 2018-05-29 MED ORDER — PREDNISONE 20 MG PO TABS: 40.0000 mg | ORAL_TABLET | Freq: Every day | ORAL | 0 refills | Status: DC

## 2018-05-29 NOTE — Progress Notes (Signed)
Subjective:    Patient ID: [name], female    DOB: 24-Jul-1940, 78 y.o.   MRN: [id number]  HPI This is a 78 yo female, accompanied by her daughter, who presents today for follow up of recent hospitalization and ER visit.   ER visit- 05/25/18- was seen following a fall that occurred when she got tangled in her walker at her home. She had negative head CT, bilateral hip xray did not show acute fracture or dislocation. Right knee without acute findings. She reports continued pain of right knee, she is concerned that her knee hardware has been damaged. She has appointment with ortho on 06/01/17.   Hospitalization- 12/22-23/19. She was diagnosed with acute on chronic hypoxic respiratory distress due to influenza A. She was given tamiflu, azithromycin for recent pneumonia. She completed all medication.   She continues to have cough which is productive of "slimy," white phlegm. Little nasal drainage. Using albuterol inhaler or neb 2x a day if she remembers. Has recently added mucinex. No relief with tessalon or cough syrup with codeine.   Continues to have severe hip pain. Has upcoming appointment with ortho. Today she is requesting a referral to pain specialist for second opinion. Has seen Dr. Dossie Arbour in the past. She has multiple allergies to pain medications as well as increased risk of falls due to multiple co morbidities. She is currently taking extra strength acetaminophen without relief and using topical preparations.   Current Outpatient Medications on File Prior to Visit  Medication Sig Dispense Refill  . albuterol (ACCUNEB) 1.25 MG/3ML nebulizer solution Take 1 ampule by nebulization every 6 (six) hours as needed for wheezing.    Marland Kitchen albuterol (PROVENTIL HFA;VENTOLIN HFA) 108 (90 BASE) MCG/ACT inhaler Inhale 2 puffs into the lungs every 6 (six) hours as needed for wheezing or shortness of breath.     Marland Kitchen alendronate (FOSAMAX) 70 MG tablet Take 70 mg by mouth every Monday. Take with a full  glass of water on an empty stomach.    . ALPRAZolam (XANAX) 1 MG tablet Take 1 tablet (1 mg total) by mouth at bedtime as needed for sleep. 30 tablet 1  . amLODipine (NORVASC) 5 MG tablet Take 5 mg by mouth daily.     Marland Kitchen apixaban (ELIQUIS) 5 MG TABS tablet Take 1 tablet (5 mg total) by mouth 2 (two) times daily. Start after 10mg  twice day dosing 60 tablet 0  . Calcium 500-100 MG-UNIT CHEW Chew 1 tablet by mouth daily.    . Cholecalciferol (VITAMIN D HIGH POTENCY PO) Take 5,000 Units by mouth daily.    . citalopram (CELEXA) 20 MG tablet Take 20 mg by mouth daily.     Marland Kitchen docusate sodium (COLACE) 100 MG capsule Take 200 mg by mouth 2 (two) times daily as needed for moderate constipation.     Water engineer Bandages & Supports (MEDICAL COMPRESSION SOCKS) MISC 1 each by Does not apply route daily as needed. 2 each 0  . esomeprazole (NEXIUM) 40 MG capsule Take 40 mg by mouth daily.     . fluticasone (FLOVENT HFA) 220 MCG/ACT inhaler Inhale 1 puff into the lungs 2 (two) times daily.    Marland Kitchen guaiFENesin (MUCINEX) 600 MG 12 hr tablet Take 600 mg by mouth 2 (two) times daily as needed for cough.    Javier Docker Oil 500 MG CAPS Take 500 mg by mouth daily.     Marland Kitchen linaclotide (LINZESS) 290 MCG CAPS capsule Take 290 mcg by mouth daily.    Marland Kitchen  lovastatin (MEVACOR) 10 MG tablet Take 10 mg by mouth daily.    . meloxicam (MOBIC) 15 MG tablet Take 15 mg by mouth daily.     . Menthol, Topical Analgesic, (BIOFREEZE EX) Apply 1 application topically daily as needed (muscle pain).    . ondansetron (ZOFRAN ODT) 4 MG disintegrating tablet Take 1 tablet (4 mg total) by mouth every 8 (eight) hours as needed for nausea or vomiting. 20 tablet 0  . polyethylene glycol (MIRALAX / GLYCOLAX) packet Take 17 g by mouth daily as needed (for constipation.).     Marland Kitchen pregabalin (LYRICA) 75 MG capsule Take 1 capsule (75 mg total) by mouth 2 (two) times daily. 60 capsule 5  . traZODone (DESYREL) 100 MG tablet Take 100 mg by mouth at bedtime.     . vitamin  B-12 (CYANOCOBALAMIN) 500 MCG tablet Take 500 mcg by mouth daily.     No current facility-administered medications on file prior to visit.      Past Medical History:  Diagnosis Date  . Acute postoperative pain 02/03/2018  . Anemia   . B12 deficiency   . Bacteremia 10/07/2014  . Bladder incontinence   . Blind left eye   . Cancer ([place])    skin cancer   . Cataract   . Cervical spine fracture ([place])   . Chest pain 07/29/2013  . Compression fracture    C7,  upper T spine -compression fx  . COPD (chronic obstructive pulmonary disease) ([place])   . DDD (degenerative disc disease), lumbar   . Depression    major  . Diffuse myofascial pain syndrome 03/24/2015  . Dyspnea    at times- when activty  . Fever 10/05/2014  . GERD (gastroesophageal reflux disease)   . Hiatal hernia   . Hyperlipidemia   . Hypertension   . 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 Macular degeneration   . On home oxygen therapy    "3L; all the time" (04/30/2018)  . Osteoarthritis   . Osteoporosis   . Pneumonia    years ago  . Pulmonary embolism ([place]) 04/30/2018  . Reactive airway disease   . Rupture of bowel ([place])   . Sepsis ([place]) 10/08/2014  . Stroke [facility])    "mini stroke"- balance and memory issue  . Stroke ([place])   . Wrist pain, acute 09/10/2012   Past Surgical History:  Procedure Laterality Date  . ABDOMINAL HYSTERECTOMY    . ABDOMINAL SURGERY    . APPENDECTOMY    . BLADDER SURGERY    . CATARACT EXTRACTION W/ INTRAOCULAR LENS  IMPLANT, BILATERAL Bilateral   . CHOLECYSTECTOMY    . COLON SURGERY    . COLOSTOMY REVERSAL    . INTRAMEDULLARY (IM) NAIL INTERTROCHANTERIC Right 09/13/2016   Procedure: INTRAMEDULLARY (IM) NAIL INTERTROCHANTRIC RIGHT;  Surgeon: Leandrew Koyanagi, MD;  Location: [place];  Service: Orthopedics;  Laterality: Right;  . JOINT REPLACEMENT Bilateral   . KNEE SURGERY    . OSTOMY    . RECTOCELE REPAIR    . TOE SURGERY Right    3rd  . TOTAL HIP ARTHROPLASTY Right 09/30/2017    Procedure: RIGHT HIP IM NAIL REMOVAL WITH CONVERSION TO TOTAL HIP ARTHOPLASTY, anterior approach;  Surgeon: Renette Butters, MD;  Location: [place];  Service: Orthopedics;  Laterality: Right;   Family History  Problem Relation Age of Onset  . Heart failure Mother   . Heart attack Father    Social History   Tobacco Use  . Smoking status:  Former Smoker    Packs/day: 1.50    Years: 30.00    Pack years: 45.00    Types: E-cigarettes, Cigarettes    Last attempt to quit: 2013    Years since quitting: 7.0  . Smokeless tobacco: Never Used  Substance Use Topics  . Alcohol use: No    Alcohol/week: 0.0 standard drinks  . Drug use: No      Review of Systems Per HPI    Objective:   Physical Exam Vitals signs reviewed.  Constitutional:      Appearance: She is ill-appearing.  HENT:     Head: Normocephalic and atraumatic.     Mouth/Throat:     Mouth: Mucous membranes are dry.  Cardiovascular:     Rate and Rhythm: Normal rate and regular rhythm.     Heart sounds: Normal heart sounds.  Pulmonary:     Effort: Pulmonary effort is normal.     Breath sounds: Wheezing (scattered expiratory) present.     Comments: Frequent cough.  Musculoskeletal:        General: No swelling (No LE edema).  Skin:    General: Skin is warm and dry.  Neurological:     Mental Status: She is alert.  Psychiatric:        Mood and Affect: Mood normal.        Behavior: Behavior normal.       BP 130/80   Pulse (!) 105   Temp 98.4 F (36.9 C) (Oral)   SpO2 96%  BP Readings from Last 3 Encounters:  05/29/18 130/80  05/25/18 130/66  05/18/18 121/67       Assessment & Plan:  1. Hospital discharge follow-up - reviewed medications, reason for hospitalization and discharge instructions  2. Chronic obstructive pulmonary disease with acute exacerbation (HCC) - continue Mucinex, instructed to use albuterol neb every 4-6 hours prn, increase fluids - RTC precautions reviewed - predniSONE (DELTASONE) 20  MG tablet; Take 2 tablets (40 mg total) by mouth daily with breakfast.  Dispense: 14 tablet; Refill: 0  3. Chronic musculoskeletal pain - given her multiple co morbidities and complexity, will refer her to pain management, encouraged continued follow up with ortho - Ambulatory referral to Pain Clinic  4. Chronic myofascial pain - Ambulatory referral to Pain Clinic  - follow up on file  Clarene Reamer, FNP-BC  [facility] [place]  05/29/2018 9:52 AM

## 2018-05-29 NOTE — Patient Instructions (Signed)
Please use your albuterol nebulizer every 4 to 6 hours as needed for cough and wheeze  If not better in 5-7 days, please let me know  I will look into different pain provider

## 2018-06-01 ENCOUNTER — Ambulatory Visit: Payer: Medicare Other | Admitting: Diagnostic Neuroimaging

## 2018-06-02 ENCOUNTER — Encounter: Payer: Self-pay | Admitting: Diagnostic Neuroimaging

## 2018-06-03 ENCOUNTER — Inpatient Hospital Stay: Payer: Medicare Other | Admitting: Oncology

## 2018-06-07 NOTE — Progress Notes (Deleted)
Patient's Name: [name]  MRN: [id number]  Referring Provider: Alvester Chou, NP  DOB: Jul 08, 1940  PCP: [name], FNP  DOS: 06/10/2018  Note by: Gaspar Cola, MD  Service setting: Ambulatory outpatient  Specialty: Interventional Pain Management  Location: [facility] (AMB) Pain Management Facility    Patient type: Established   Primary Reason(s) for Visit: Encounter for post-procedure evaluation of chronic illness with mild to moderate exacerbation CC: No chief complaint on file.  HPI  [name] is a 78 y.o. year old, female patient, who comes today for a post-procedure evaluation. 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[place]); Hypertensive kidney disease, malignant; Chronic low back pain (Tertiary Area of Pain) (Bilateral) (L>R); CAFL (chronic airflow limitation) ([place]); Duodenogastric reflux; History of stroke; Hoarseness; Benign essential HTN; Chronic knee pain (Primary Area of Pain) (Right); Depression, major, in partial remission ([place]); Major depression in remission ([place]); Involutional osteoporosis; Pharyngoesophageal dysphagia; 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); Hx of total knee replacement (Bilateral); Bed wetting; Long term current use of opiate analgesic; Long term prescription opiate use; Chronic pain syndrome; Chronic neck pain (Secondary Area of Pain) (Bilateral) (L>R); Chronic hip pain (Right); Rib pain; DDD (degenerative disc disease), cervical; Lumbar facet arthropathy (Bilateral); Chronic myofascial pain; Lumbar facet syndrome (Bilateral) (L>R); Long term prescription benzodiazepine use; Opiate  use; Disorder of skeletal system; Pharmacologic therapy; Problems influencing health status; Opioid-induced constipation (OIC); NSAID long-term use; DDD (degenerative disc disease), lumbar; Chronic knee pain (Bilateral) (R>L); Osteoarthritis; Chronic musculoskeletal pain; Neurogenic pain; Chronic knee pain s/p total knee replacement (TKR) (Right); CKD (chronic kidney disease) stage 3, GFR 30-59 ml/min ([place]); Chronic shoulder pain (Left); Non-traumatic compression fracture of T3 thoracic vertebra, sequela; High risk medication use; At high risk for falls; Chronic sacroiliac joint pain (Right); Spondylosis without myelopathy or radiculopathy, lumbosacral region; Other specified dorsopathies, sacral and sacrococcygeal region; Abnormal x-ray of pelvis; SBO (small bowel obstruction) ([place]); History of small bowel obstruction; Delayed union of closed fracture of hip, right; Primary osteoarthritis of hip; Ileus ([place]); It band syndrome, right; History of hip replacement (Right); Closed hip fracture, sequela (Right); Chronic lower extremity pain (Right); Abnormal MRI, lumbar spine; Lumbar facet hypertrophy (Multilevel) (Bilateral); 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 Her primarily concern today is the No chief complaint on file.  Pain Assessment: Location:     Radiating:   Onset:   Duration:   Quality:   Severity:  /10 (subjective, self-reported pain score)  Note: Reported level is compatible with observation.                         When using our objective Pain Scale, levels between 6 and 10/10 are said to belong in an emergency room, as it progressively worsens from a 6/10, described as severely limiting,  requiring emergency care not usually available at an outpatient pain management facility. At a 6/10 level, communication becomes difficult and requires great effort. Assistance to reach the emergency department may be required. Facial flushing and profuse  sweating along with potentially dangerous increases in heart rate and blood pressure will be evident. Effect on ADL:   Timing:   Modifying factors:   BP:    HR:    [name] comes in today for post-procedure evaluation. The patient had indicates that Dr. Percell Miller had suggested that she should have some lumbar epidural steroid injections to see if this pain that she is experiencing in her right lower extremity was secondary to a spinal stenosis that apparently he observed on the recent MRIs that he took in his office. [name] informed him that she would prefer to have those injections done here, since we offered sedation. Since the request was reasonable, we proceeded with a diagnostic LESI & TFESI, on the right side, at the level of the L3 & L4 nerve roots. Unfortunately, we do not have the results of the quoted MRI, however, we did request them.  The plan was to follow-up with the patient 2 weeks after the procedure to evaluate the results.  The patient was scheduled to return on 04/27/2018, a visit that the patient did not keep.  I have reviewed her medical records for possible reason not to keep her visit and I did find that on 04/20/2018 she was admitted to the hospital with a community-acquired pneumonia and acute kidney failure.  She was treated and released on 04/25/2018.  Having been released, she should have been able to keep her 04/27/2018 appointment.  Unfortunately, on 04/30/2018 she was readmitted to the hospital with an acute pulmonary embolism and cor pulmonale.  The patient was discharged home on 05/04/2018.  Further details on both, my assessment(s), as well as the proposed treatment plan, please see below.  Post-Procedure Assessment   04/13/2018 Procedure: Diagnostic right-sided L3-4 interlaminar LESI #1 + right sided L4 transforaminal ESI #1 under fluoroscopic guidance and IV sedation Pre-procedure pain score:  6/10 Post-procedure pain score: 8/10 No relief Influential Factors: BMI:   Intra-procedural challenges: None observed.         Assessment challenges: None detected.              Reported side-effects: None.        Post-procedural adverse reactions or complications: None reported         Sedation: Sedation provided. When no sedatives are used, the analgesic levels obtained are directly associated to the effectiveness of the local anesthetics. However, when sedation is provided, the level of analgesia obtained during the initial 1 hour following the intervention, is believed to be the result of a combination of factors. These factors may include, but are not limited to: 1. The effectiveness of the local anesthetics used. 2. The effects of the analgesic(s) and/or anxiolytic(s) used. 3. The degree of discomfort experienced by the patient at the time of the procedure. 4. The patients ability and reliability in recalling and recording the events. 5. The presence and influence of possible secondary gains and/or psychosocial factors. Reported result: Relief experienced during the 1st hour after the procedure:   (Ultra-Short Term Relief)            Interpretative annotation: Clinically appropriate result. Analgesia during this period is likely to be Local Anesthetic and/or IV Sedative (Analgesic/Anxiolytic) related.          Effects of local anesthetic: The analgesic effects attained during this period are directly associated to the localized infiltration of local anesthetics and therefore cary significant diagnostic value as to the etiological location, or anatomical origin, of the pain. Expected duration  of relief is directly dependent on the pharmacodynamics of the local anesthetic used. Long-acting (4-6 hours) anesthetics  used.  Reported result: Relief during the next 4 to 6 hour after the procedure:   (Short-Term Relief)            Interpretative annotation: Clinically appropriate result. Analgesia during this period is likely to be Local Anesthetic-related.          Long-term benefit: Defined as the period of time past the expected duration of local anesthetics (1 hour for short-acting and 4-6 hours for long-acting). With the possible exception of prolonged sympathetic blockade from the local anesthetics, benefits during this period are typically attributed to, or associated with, other factors such as analgesic sensory neuropraxia, antiinflammatory effects, or beneficial biochemical changes provided by agents other than the local anesthetics.  Reported result: Extended relief following procedure:   (Long-Term Relief)            Interpretative annotation: Clinically possible results. Good relief. No permanent benefit expected. Inflammation plays a part in the etiology to the pain.          Current benefits: Defined as reported results that persistent at this point in time.   Analgesia: *** %            Function: Somewhat improved ROM: Somewhat improved Interpretative annotation: Recurrence of symptoms. No permanent benefit expected. Effective diagnostic intervention.          Interpretation: Results would suggest a successful diagnostic intervention.                  Plan:  Please see "Plan of Care" for details.                Laboratory Chemistry  Inflammation Markers (CRP: Acute Phase) (ESR: Chronic Phase) Lab Results  Component Value Date   CRP <0.8 11/19/2016   ESRSEDRATE 26 (H) 02/09/2018   LATICACIDVEN 3.2 (HH) 05/17/2018                         Rheumatology Markers No results found.  Renal Markers Lab Results  Component Value Date   BUN 7 (L) 05/17/2018   CREATININE 0.94 05/17/2018   BCR 6 (L) 12/04/2016   GFRAA >60 05/17/2018   GFRNONAA 59 (L) 05/17/2018                              Hepatic Markers Lab Results  Component Value Date   AST 26 05/17/2018   ALT 12 05/17/2018   ALBUMIN 3.0 (L) 05/17/2018                        Neuropathy Markers Lab Results  Component Value Date   VITAMINB12 1,888 (H) 11/19/2016   HGBA1C 4.9 09/19/2017   HIV Non Reactive 05/17/2018                        Hematology Parameters Lab Results  Component Value Date   INR 1.21 05/17/2018   LABPROT 15.2 05/17/2018   APTT 30 05/17/2018   PLT 229 05/17/2018   HGB 12.5 05/17/2018   HCT 41.1 05/17/2018   DDIMER 1.71 (H) 10/23/2016                        CV Markers Lab Results  Component Value Date   BNP 60.2  01/23/2016   TROPONINI <0.03 05/17/2018                         Note: Lab results reviewed.  Recent Imaging Results   Results for orders placed in visit on 04/07/18  DG C-Arm 1-60 Min-No Report   Narrative Fluoroscopy was utilized by the requesting physician.  No radiographic  interpretation.    Interpretation Report: Fluoroscopy was used during the procedure to assist with needle guidance. The images were interpreted intraoperatively by the requesting physician.  Meds   Current Outpatient Medications:  .  albuterol (ACCUNEB) 1.25 MG/3ML nebulizer solution, Take 1 ampule by nebulization every 6 (six) hours as needed for wheezing., Disp: , Rfl:  .  albuterol (PROVENTIL HFA;VENTOLIN HFA) 108 (90 BASE) MCG/ACT inhaler, Inhale 2 puffs into the lungs every 6 (six) hours as needed for wheezing or shortness of breath. , Disp: , Rfl:  .  alendronate (FOSAMAX) 70 MG tablet, Take 70 mg by mouth every Monday. Take with a full glass of water on an empty stomach., Disp: , Rfl:  .  ALPRAZolam (XANAX) 1 MG tablet, Take 1 tablet (1 mg total) by mouth at bedtime as needed for sleep., Disp: 30 tablet, Rfl: 1 .  amLODipine (NORVASC) 5 MG tablet, Take 5 mg by mouth daily. , Disp: , Rfl:  .  apixaban (ELIQUIS) 5 MG TABS tablet, Take 1 tablet (5 mg total) by mouth 2 (two) times daily.  Start after 42m twice day dosing, Disp: 60 tablet, Rfl: 0 .  Calcium 500-100 MG-UNIT CHEW, Chew 1 tablet by mouth daily., Disp: , Rfl:  .  Cholecalciferol (VITAMIN D HIGH POTENCY PO), Take 5,000 Units by mouth daily., Disp: , Rfl:  .  citalopram (CELEXA) 20 MG tablet, Take 20 mg by mouth daily. , Disp: , Rfl:  .  docusate sodium (COLACE) 100 MG capsule, Take 200 mg by mouth 2 (two) times daily as needed for moderate constipation. , Disp: , Rfl:  .  Elastic Bandages & Supports (MEDICAL COMPRESSION SOCKS) MISC, 1 each by Does not apply route daily as needed., Disp: 2 each, Rfl: 0 .  esomeprazole (NEXIUM) 40 MG capsule, Take 40 mg by mouth daily. , Disp: , Rfl:  .  fluticasone (FLOVENT HFA) 220 MCG/ACT inhaler, Inhale 1 puff into the lungs 2 (two) times daily., Disp: , Rfl:  .  guaiFENesin (MUCINEX) 600 MG 12 hr tablet, Take 600 mg by mouth 2 (two) times daily as needed for cough., Disp: , Rfl:  .  Krill Oil 500 MG CAPS, Take 500 mg by mouth daily. , Disp: , Rfl:  .  linaclotide (LINZESS) 290 MCG CAPS capsule, Take 290 mcg by mouth daily., Disp: , Rfl:  .  lovastatin (MEVACOR) 10 MG tablet, Take 10 mg by mouth daily., Disp: , Rfl:  .  meloxicam (MOBIC) 15 MG tablet, Take 15 mg by mouth daily. , Disp: , Rfl:  .  Menthol, Topical Analgesic, (BIOFREEZE EX), Apply 1 application topically daily as needed (muscle pain)., Disp: , Rfl:  .  ondansetron (ZOFRAN ODT) 4 MG disintegrating tablet, Take 1 tablet (4 mg total) by mouth every 8 (eight) hours as needed for nausea or vomiting., Disp: 20 tablet, Rfl: 0 .  polyethylene glycol (MIRALAX / GLYCOLAX) packet, Take 17 g by mouth daily as needed (for constipation.). , Disp: , Rfl:  .  predniSONE (DELTASONE) 20 MG tablet, Take 2 tablets (40 mg total) by mouth daily with breakfast.,  Disp: 14 tablet, Rfl: 0 .  pregabalin (LYRICA) 75 MG capsule, Take 1 capsule (75 mg total) by mouth 2 (two) times daily., Disp: 60 capsule, Rfl: 5 .  traZODone (DESYREL) 100 MG tablet,  Take 100 mg by mouth at bedtime. , Disp: , Rfl:  .  vitamin B-12 (CYANOCOBALAMIN) 500 MCG tablet, Take 500 mcg by mouth daily., Disp: , Rfl:   ROS  Constitutional: Denies any fever or chills Gastrointestinal: No reported hemesis, hematochezia, vomiting, or acute GI distress Musculoskeletal: Denies any acute onset joint swelling, redness, loss of ROM, or weakness Neurological: No reported episodes of acute onset apraxia, aphasia, dysarthria, agnosia, amnesia, paralysis, loss of coordination, or loss of consciousness  Allergies  [name] is allergic to ampicillin; ampicillin-sulbactam sodium; ambien [zolpidem tartrate]; flexeril [cyclobenzaprine]; tape; toradol [ketorolac tromethamine]; sulfamethoxazole-trimethoprim; zolpidem; cephalexin; and tramadol.  [place]  Drug: [name]  reports no history of drug use. Alcohol:  reports no history of alcohol use. Tobacco:  reports that she quit smoking about 7 years ago. Her smoking use included e-cigarettes and cigarettes. She has a 45.00 pack-year smoking history. She has never used smokeless tobacco. Medical:  has a past medical history of Acute postoperative pain (02/03/2018), Anemia, B12 deficiency, Bacteremia (10/07/2014), Bladder incontinence, Blind left eye, Cancer ([place]), Cataract, Cervical spine fracture ([place]), Chest pain (07/29/2013), Compression fracture, COPD (chronic obstructive pulmonary disease) ([place]), DDD (degenerative disc disease), lumbar, Depression, Diffuse myofascial pain syndrome (03/24/2015), Dyspnea, Fever (10/05/2014), GERD (gastroesophageal reflux disease), Hiatal hernia, Hyperlipidemia, Hypertension, Hypertensive kidney disease, malignant (11/07/2016), Macular degeneration, On home oxygen therapy, Osteoarthritis, Osteoporosis, Pneumonia, Pulmonary embolism ([place]) (04/30/2018), Reactive airway disease, Rupture of bowel ([place]), Sepsis ([place]) (10/08/2014), Stroke [facility]), Stroke ([place]), and Wrist pain, acute (09/10/2012). Surgical: [name]  has a  past surgical history that includes Abdominal surgery; Colon surgery; Appendectomy; Bladder surgery; Rectocele repair; Knee surgery; Cholecystectomy; Abdominal hysterectomy; Ostomy; Intramedullary (im) nail intertrochanteric (Right, 09/13/2016); Joint replacement (Bilateral); Toe Surgery (Right); Colostomy reversal; Total hip arthroplasty (Right, 09/30/2017); and Cataract extraction w/ intraocular lens  implant, bilateral (Bilateral). Family: family history includes Heart attack in her father; Heart failure in her mother.  Constitutional Exam  General appearance: Well nourished, well developed, and well hydrated. In no apparent acute distress There were no vitals filed for this visit. BMI Assessment: Estimated body mass index is 28.6 kg/m as calculated from the following:   Height as of 05/25/18: '5\' 4"'  (1.626 m).   Weight as of 05/25/18: 166 lb 9.6 oz (75.6 kg).  BMI interpretation table: BMI level Category Range association with higher incidence of chronic pain  <18 kg/m2 Underweight   18.5-24.9 kg/m2 Ideal body weight   25-29.9 kg/m2 Overweight Increased incidence by 20%  30-34.9 kg/m2 Obese (Class I) Increased incidence by 68%  35-39.9 kg/m2 Severe obesity (Class II) Increased incidence by 136%  >40 kg/m2 Extreme obesity (Class III) Increased incidence by 254%   Patient's current BMI Ideal Body weight  There is no height or weight on file to calculate BMI. Patient weight not recorded   BMI Readings from Last 4 Encounters:  05/25/18 28.60 kg/m  05/17/18 28.60 kg/m  05/13/18 30.54 kg/m  05/01/18 32.90 kg/m   Wt Readings from Last 4 Encounters:  05/25/18 166 lb 9.6 oz (75.6 kg)  05/17/18 166 lb 9.6 oz (75.6 kg)  05/13/18 167 lb (75.8 kg)  05/01/18 179 lb 14.3 oz (81.6 kg)  Psych/Mental status: Alert, oriented x 3 (person, place, & time)       Eyes: PERLA Respiratory:  No evidence of acute respiratory distress  Cervical Spine Area Exam  Skin & Axial Inspection: No masses,  redness, edema, swelling, or associated skin lesions Alignment: Symmetrical Functional ROM: Unrestricted ROM      Stability: No instability detected Muscle Tone/Strength: Functionally intact. No obvious neuro-muscular anomalies detected. Sensory (Neurological): Unimpaired Palpation: No palpable anomalies              Upper Extremity (UE) Exam    Side: Right upper extremity  Side: Left upper extremity  Skin & Extremity Inspection: Skin color, temperature, and hair growth are WNL. No peripheral edema or cyanosis. No masses, redness, swelling, asymmetry, or associated skin lesions. No contractures.  Skin & Extremity Inspection: Skin color, temperature, and hair growth are WNL. No peripheral edema or cyanosis. No masses, redness, swelling, asymmetry, or associated skin lesions. No contractures.  Functional ROM: Unrestricted ROM          Functional ROM: Unrestricted ROM          Muscle Tone/Strength: Functionally intact. No obvious neuro-muscular anomalies detected.  Muscle Tone/Strength: Functionally intact. No obvious neuro-muscular anomalies detected.  Sensory (Neurological): Unimpaired          Sensory (Neurological): Unimpaired          Palpation: No palpable anomalies              Palpation: No palpable anomalies              Provocative Test(s):  Phalen's test: deferred Tinel's test: deferred Apley's scratch test (touch opposite shoulder):  Action 1 (Across chest): deferred Action 2 (Overhead): deferred Action 3 (LB reach): deferred   Provocative Test(s):  Phalen's test: deferred Tinel's test: deferred Apley's scratch test (touch opposite shoulder):  Action 1 (Across chest): deferred Action 2 (Overhead): deferred Action 3 (LB reach): deferred    Thoracic Spine Area Exam  Skin & Axial Inspection: No masses, redness, or swelling Alignment: Symmetrical Functional ROM: Unrestricted ROM Stability: No instability detected Muscle Tone/Strength: Functionally intact. No obvious  neuro-muscular anomalies detected. Sensory (Neurological): Unimpaired Muscle strength & Tone: No palpable anomalies  Lumbar Spine Area Exam  Skin & Axial Inspection: No masses, redness, or swelling Alignment: Symmetrical Functional ROM: Unrestricted ROM       Stability: No instability detected Muscle Tone/Strength: Functionally intact. No obvious neuro-muscular anomalies detected. Sensory (Neurological): Unimpaired Palpation: No palpable anomalies       Provocative Tests: Hyperextension/rotation test: deferred today       Lumbar quadrant test (Kemp's test): deferred today       Lateral bending test: deferred today       Patrick's Maneuver: deferred today                   FABER* test: deferred today                   S-I anterior distraction/compression test: deferred today         S-I lateral compression test: deferred today         S-I Thigh-thrust test: deferred today         S-I Gaenslen's test: deferred today         *(Flexion, ABduction and External Rotation)  Gait & Posture Assessment  Ambulation: Patient came in today in a wheel chair Gait: Significantly limited. Dependent on assistive device to ambulate Posture: Antalgic   Lower Extremity Exam    Side: Right lower extremity  Side: Left lower extremity  Stability: No instability observed  Stability: No instability observed          Skin & Extremity Inspection: Skin color, temperature, and hair growth are WNL. No peripheral edema or cyanosis. No masses, redness, swelling, asymmetry, or associated skin lesions. No contractures.  Skin & Extremity Inspection: Skin color, temperature, and hair growth are WNL. No peripheral edema or cyanosis. No masses, redness, swelling, asymmetry, or associated skin lesions. No contractures.  Functional ROM: Unrestricted ROM                  Functional ROM: Unrestricted ROM                  Muscle Tone/Strength: Functionally intact. No obvious neuro-muscular anomalies detected.  Muscle  Tone/Strength: Functionally intact. No obvious neuro-muscular anomalies detected.  Sensory (Neurological): Unimpaired        Sensory (Neurological): Unimpaired        DTR: Patellar: deferred today Achilles: deferred today Plantar: deferred today  DTR: Patellar: deferred today Achilles: deferred today Plantar: deferred today  Palpation: No palpable anomalies  Palpation: No palpable anomalies   Assessment  Primary Diagnosis & Pertinent Problem List: The primary encounter diagnosis was Chronic knee pain (Primary Area of Pain) (Right). Diagnoses of Chronic hip pain (Right), Chronic knee pain s/p total knee replacement (TKR) (Right), Chronic neck pain (Secondary Area of Pain) (Bilateral) (L>R), Chronic low back pain (Tertiary Area of Pain) (Bilateral) (L>R), Intertrochanteric fracture of femur, sequela (Right), Hx of total knee replacement (Bilateral), History of hip replacement (Right), High risk medication use, and Long term prescription benzodiazepine use were also pertinent to this visit.  Status Diagnosis  Controlled Controlled Controlled 1. Chronic knee pain (Primary Area of Pain) (Right)   2. Chronic hip pain (Right)   3. Chronic knee pain s/p total knee replacement (TKR) (Right)   4. Chronic neck pain (Secondary Area of Pain) (Bilateral) (L>R)   5. Chronic low back pain [facility] Area of Pain) (Bilateral) (L>R)   6. Intertrochanteric fracture of femur, sequela (Right)   7. Hx of total knee replacement (Bilateral)   8. History of hip replacement (Right)   9. High risk medication use   10. Long term prescription benzodiazepine use     Problems updated and reviewed during this visit: Problem  Primary Osteoarthritis of Hip  Delayed Union of Closed Fracture of Hip, Right  Abnormal X-Ray of Pelvis  Copd With Acute Exacerbation ([facility])  Pressure Injury of Skin  Pe (Pulmonary Thromboembolism) ([facility])  Acute Metabolic Encephalopathy  Respiratory Failure With Hypoxia ([facility])  Cap (Community  Acquired Pneumonia)  Acute Lower Uti  Ileus ([facility])  History of Small Bowel Obstruction  Sbo (Small Bowel Obstruction) ([facility])  Acute Right Hip Pain (Resolved)  Uti (Urinary Tract Infection) (Resolved)  Sepsis ([facility]) (Resolved)  Bacteremia (Resolved)  Leukopenia (Resolved)  Apoplectic Vertigo (Resolved)  Infection of The Upper Respiratory Tract (Resolved)   Plan of Care  Pharmacotherapy (Medications Ordered): No orders of the defined types were placed in this encounter.  Medications administered today: [name] had no medications administered during this visit.  Procedure Orders    No procedure(s) ordered today   Lab Orders  No laboratory test(s) ordered today   Imaging Orders  No imaging studies ordered today   Referral Orders  No referral(s) requested today   Interventional management options: Planned, scheduled, and/or pending:   ***   Considering:   Diagnostic right-sided genicular nerve block#2 Possible right-sided genicular nerve RFA Diagnostic right Femoral + Obturator nerve block #1  Possible right Femoral + Obturator nerve RFA Diagnostic left cervical epidural steroid injection Diagnostic bilateral cervical facet blocks Diagnostic bilateral intra-articular shoulder joint injection Diagnostic bilateral suprascapular nerve block Possible bilateral suprascapular nerve RFA Diagnosticright-sidedlumbar facet block #3 Possibleright-sidedlumbar facet RFA Diagnostic right-sided sacroiliac joint block #3 Possible right-sided sacroiliac joint RFA   Palliative PRN treatment(s):   None at this time   Provider-requested follow-up: No follow-ups on file.  Future Appointments  Date Time Provider [place]  06/10/2018 11:15 AM Milinda Pointer, MD [facility] None   Primary Care Physician: [name], FNP Location: [facility] Note by: Gaspar Cola, MD Date: 06/10/2018; Time: 5:29 AM

## 2018-06-09 IMAGING — MR MR KNEE*L* W/O CM
4 of 7 series · 15 of 40 positions shown · non-contrast
Comparison: None.

CLINICAL DATA: Feels like the knee is popping out.  Pain.

EXAM:
MRI OF THE LEFT KNEE WITHOUT CONTRAST
TECHNIQUE: Multiplanar, multisequence MR imaging of the knee was performed. No
intravenous contrast was administered.

[Series 2: T1 · axial · 3.5mm · 0.23mm/px · z∈[-91,+48]mm · 6 of 34 slices shown (1 of 2)]
[im 1/34]
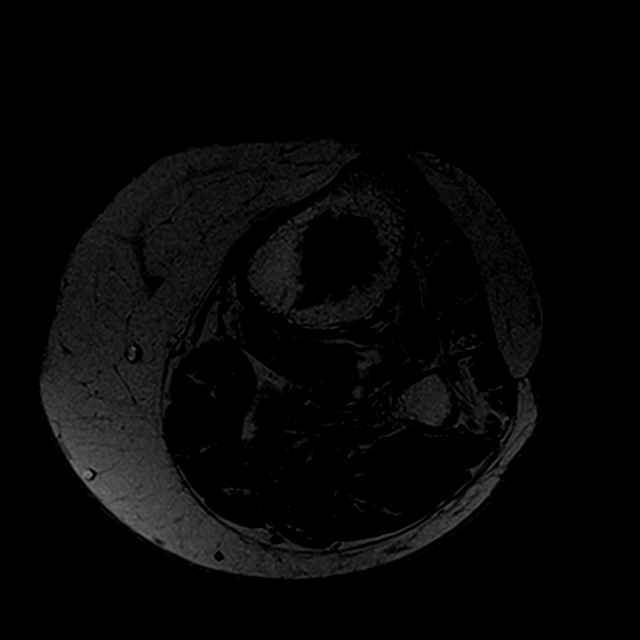
[im 7/34]
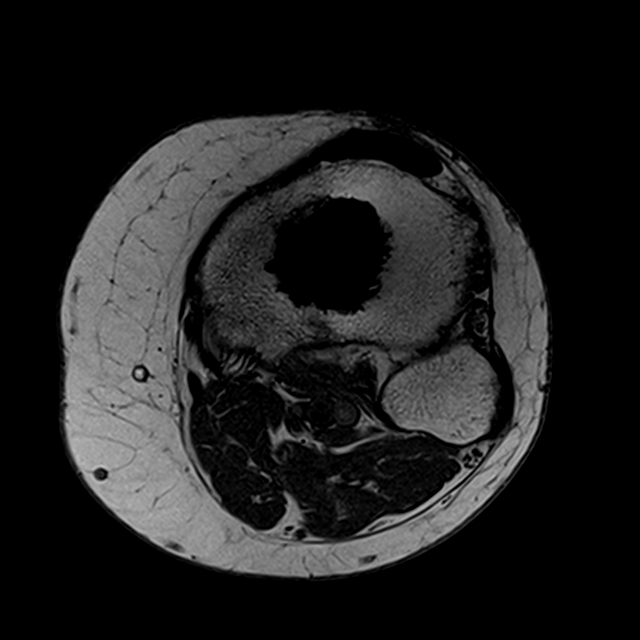
[im 14/34]
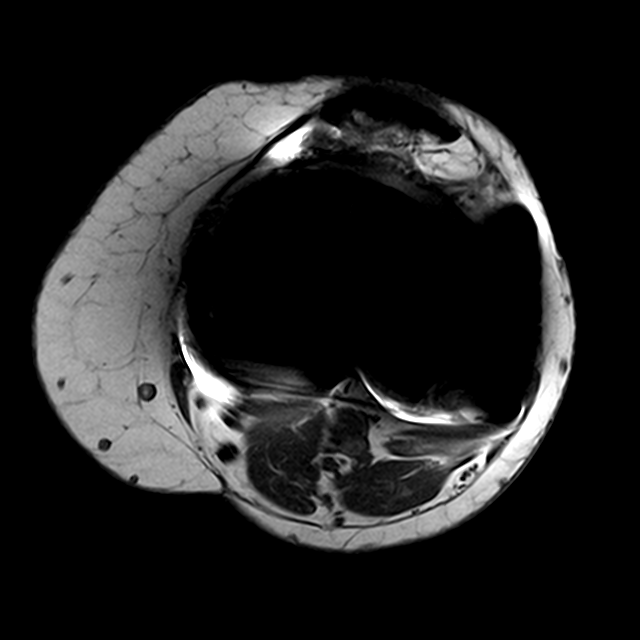
[im 20/34]
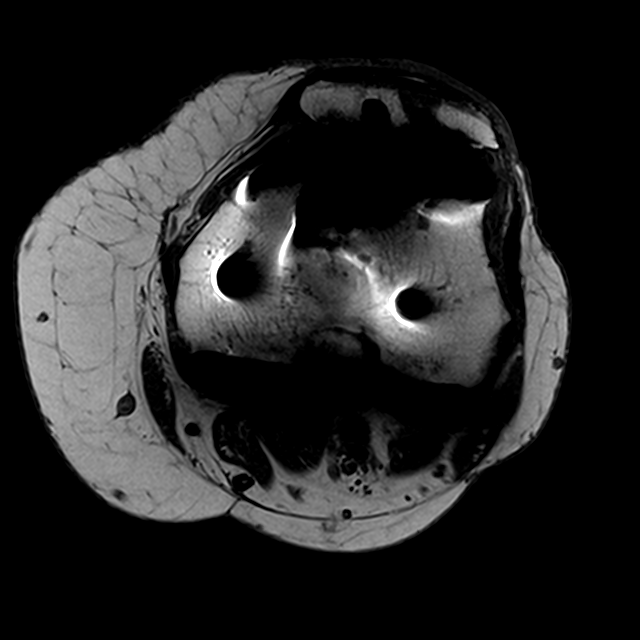
[im 27/34]
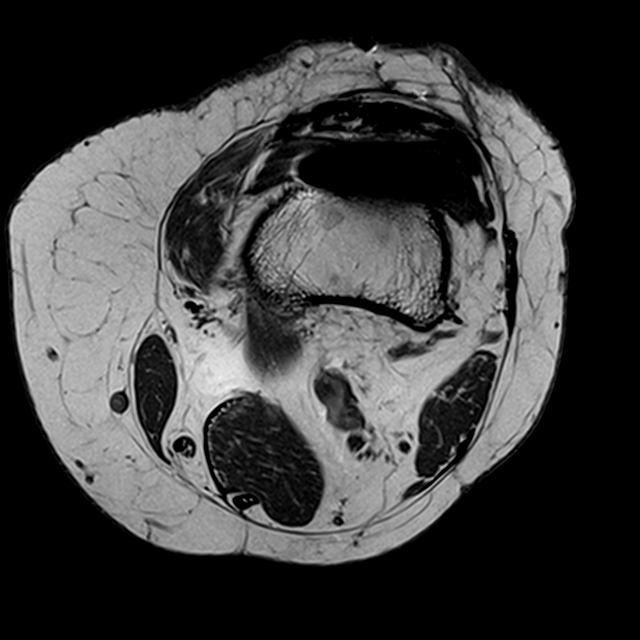
[im 34/34]
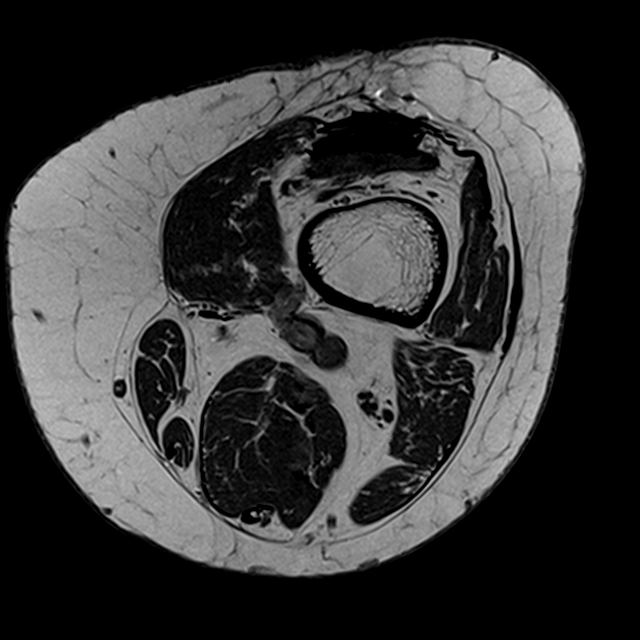

[Series 3: STIR · axial · 3.5mm · 0.35mm/px · z∈[-70,+23]mm · 3 of 34 slices shown (1 of 2)]
[im 6/34]
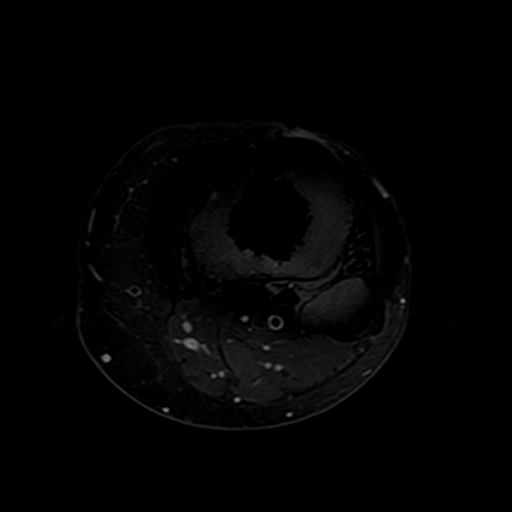
[im 17/34]
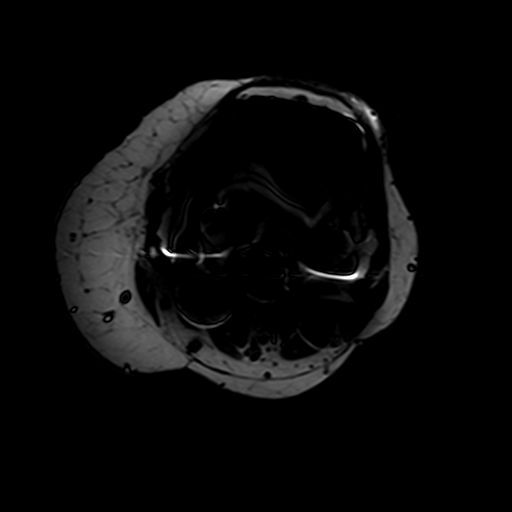
[im 28/34]
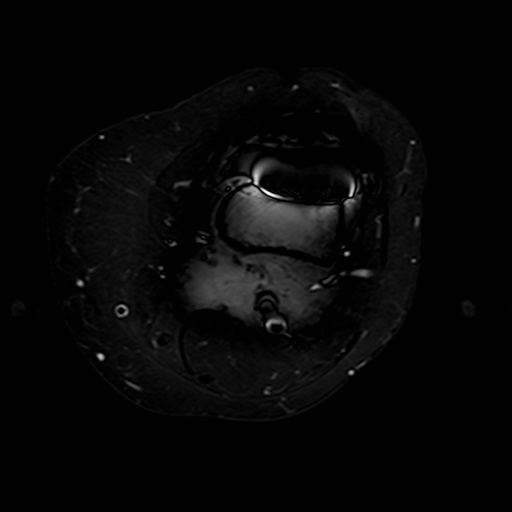

[Series 4: T1 · coronal · 3.5mm · 0.28mm/px · 3 of 27 slices shown (2 of 2)]
[im 6/27]
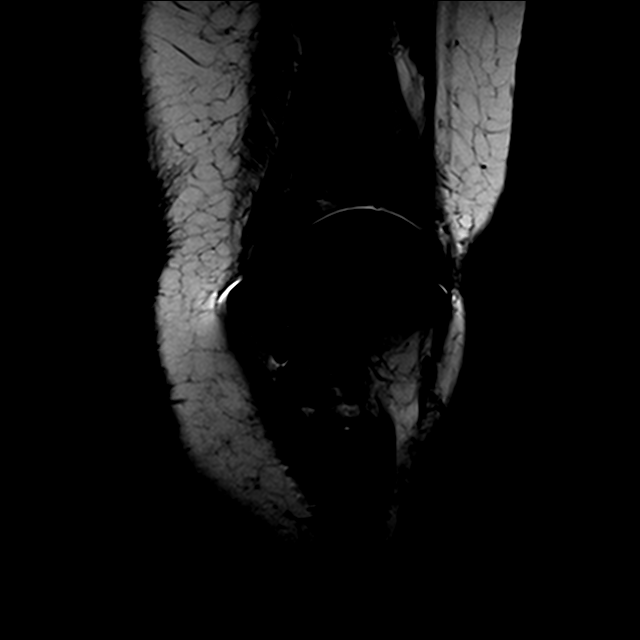
[im 16/27]
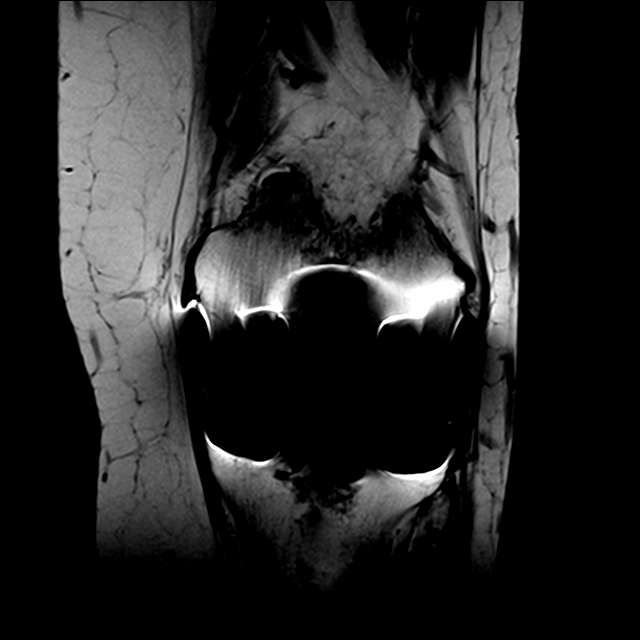
[im 27/27]
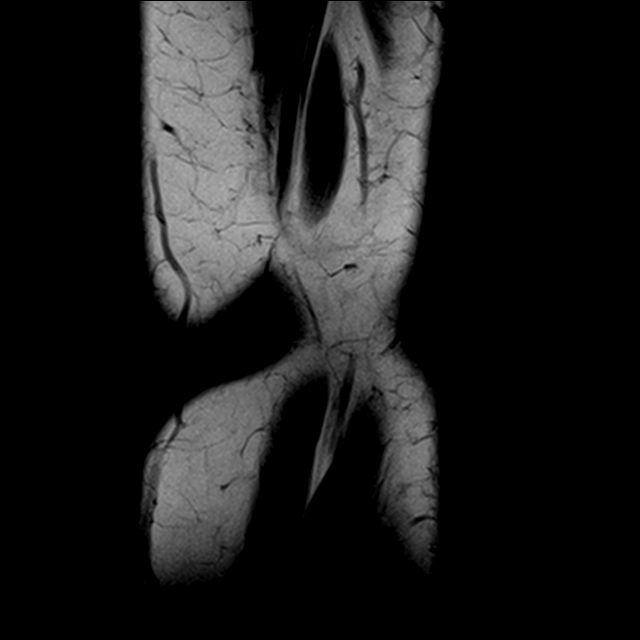

[Series 5: STIR · coronal · 3.5mm · 0.35mm/px · 3 of 27 slices shown (2 of 2)]
[im 6/27]
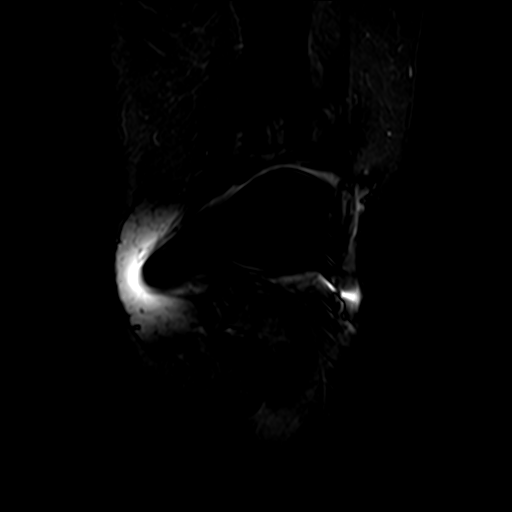
[im 16/27]
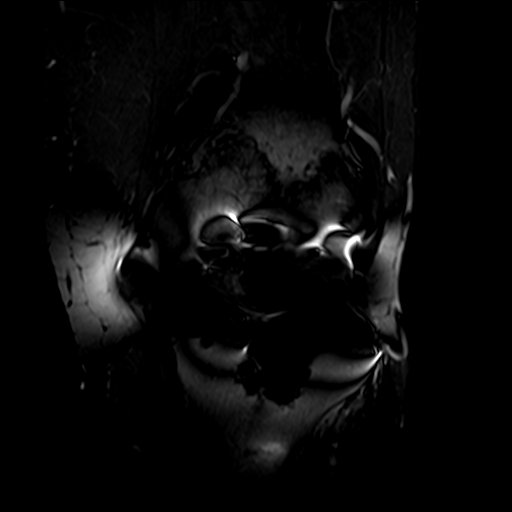
[im 27/27]
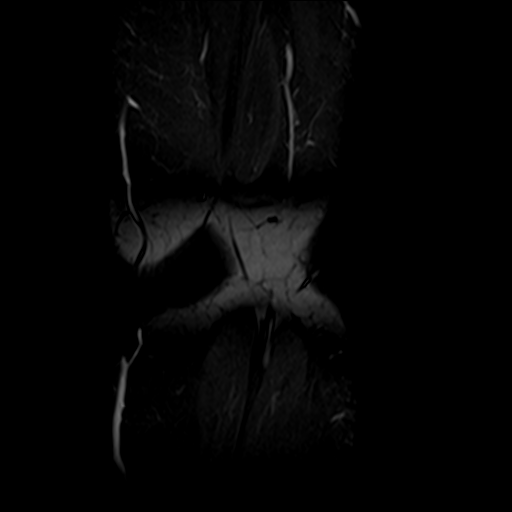

[15 of 40 positions shown; findings below may reference images not displayed]

FINDINGS: Bones/Joint/Cartilage

Left total knee arthroplasty with severe susceptibility artifact
which obscures adjacent soft tissue and osseous structures. No
obvious periarticular fluid collection. No osteolysis. No acute
fracture or dislocation. Overall alignment is anatomic. No Baker
cyst.

Ligaments

Collateral ligaments are obscured by susceptibility artifact.

Muscles and Tendons
No muscle signal abnormality. No muscle atrophy or edema. No
intramuscular fluid collection or hematoma. Patellar tendon and
quadriceps tendon are intact.

Soft tissue
No fluid collection or hematoma. No soft tissue mass. Popliteal
fossa is normal. Normal neurovascular bundles.
IMPRESSION: 1. Left total knee arthroplasty with susceptibility artifact
partially obscuring the adjacent soft tissue osseous structures. No
evidence of hardware failure or complication.
2. No Baker cyst.

## 2018-06-10 ENCOUNTER — Ambulatory Visit: Payer: Medicare Other | Admitting: Pain Medicine

## 2018-06-11 ENCOUNTER — Telehealth: Payer: Self-pay | Admitting: Family Medicine

## 2018-06-11 NOTE — Telephone Encounter (Signed)
Called the patient and her Pain Clinic Appt is on 06/24/2018 at 10:20am with Dr Dian Situ. Daughter Juliann Pulse says her mother is in a lot of pain and will be seeing you tomorrow. Just wanted you to know when the Pain Appt is. The Orthopedic Dr Percell Miller had already placed a Pain Referral for her and whrn I called them the Appt was already scheduled.

## 2018-06-11 NOTE — Telephone Encounter (Signed)
Noted  

## 2018-06-12 ENCOUNTER — Encounter: Payer: Self-pay | Admitting: Family Medicine

## 2018-06-12 ENCOUNTER — Ambulatory Visit (INDEPENDENT_AMBULATORY_CARE_PROVIDER_SITE_OTHER): Payer: Medicare Other | Admitting: Family Medicine

## 2018-06-12 VITALS — BP 140/86 | HR 103 | Temp 97.7°F | Ht 62.0 in | Wt 168.0 lb

## 2018-06-12 DIAGNOSIS — R112 Nausea with vomiting, unspecified: Secondary | ICD-10-CM | POA: Diagnosis not present

## 2018-06-12 DIAGNOSIS — M79604 Pain in right leg: Secondary | ICD-10-CM

## 2018-06-12 MED ORDER — ONDANSETRON 4 MG PO TBDP: 4.0000 mg | ORAL_TABLET | Freq: Three times a day (TID) | ORAL | 0 refills | Status: AC | PRN

## 2018-06-12 MED ORDER — OXYCODONE-ACETAMINOPHEN 5-325 MG PO TABS: 1.0000 | ORAL_TABLET | Freq: Four times a day (QID) | ORAL | 0 refills | Status: AC | PRN

## 2018-06-12 NOTE — Progress Notes (Signed)
Subjective:    Patient ID: [name], female    DOB: [birth year], 78 y.o.   MRN: [id number]  HPI This is a 78 yo female, accompanied by her daughter, who presents today with continued severe hip pain. The patient had a hip replacement 5/19. Bone is not growing around stem of hip into femur. This causes here severe pain and limited ability to move/ambulate. Movement of leg and weight bearing cause stem of artificial hip to twist into leg and pain from hip to just above knee. She saw Dr. Percell Miller who prescribed tramadol. The patient had to stop due to headaches. She has been referred to Dr. Delfino Lovett who is seeing her 06/18/18. Was prescribed prednisone, will pick up today. Unable to ambulate independently, has to been rolled in her Rollator. She has upcoming appointment with pain clinic 06/12/17. She has been followed by pain management in past.  Nausea and vomiting with pain. No abdominal pain. No weight loss. Appetite good. No dysuria. Regular bowel movements with linzess, stool softeners.  She denies any recent chest pain, SOB, falls.   Past Medical History:  Diagnosis Date  . Acute postoperative pain 02/03/2018  . Anemia   . B12 deficiency   . Bacteremia 10/07/2014  . Bladder incontinence   . Blind left eye   . Cancer ([place])    skin cancer   . Cataract   . Cervical spine fracture ([place])   . Chest pain 07/29/2013  . Compression fracture    C7,  upper T spine -compression fx  . COPD (chronic obstructive pulmonary disease) ([place])   . DDD (degenerative disc disease), lumbar   . Depression    major  . Diffuse myofascial pain syndrome 03/24/2015  . Dyspnea    at times- when activty  . Fever 10/05/2014  . GERD (gastroesophageal reflux disease)   . Hiatal hernia   . Hyperlipidemia   . Hypertension   . 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 Macular degeneration   . On home oxygen therapy    "3L; all the time" (04/30/2018)  . Osteoarthritis   . Osteoporosis   . Pneumonia    years ago  . Pulmonary embolism ([place]) 04/30/2018  . Reactive airway disease   . Rupture of bowel ([place])   . Sepsis ([place]) 10/08/2014  . Stroke [facility])    "mini stroke"- balance and memory issue  . Stroke ([place])   . Wrist pain, acute 09/10/2012   Past Surgical History:  Procedure Laterality Date  . ABDOMINAL HYSTERECTOMY    . ABDOMINAL SURGERY    . APPENDECTOMY    . BLADDER SURGERY    . CATARACT EXTRACTION W/ INTRAOCULAR LENS  IMPLANT, BILATERAL Bilateral   . CHOLECYSTECTOMY    . COLON SURGERY    . COLOSTOMY REVERSAL    . INTRAMEDULLARY (IM) NAIL INTERTROCHANTERIC Right 09/13/2016   Procedure: INTRAMEDULLARY (IM) NAIL INTERTROCHANTRIC RIGHT;  Surgeon: Leandrew Koyanagi, MD;  Location: [place];  Service: Orthopedics;  Laterality: Right;  . JOINT REPLACEMENT Bilateral   . KNEE SURGERY    . OSTOMY    . RECTOCELE REPAIR    . TOE SURGERY Right    3rd  . TOTAL HIP ARTHROPLASTY Right 09/30/2017   Procedure: RIGHT HIP IM NAIL REMOVAL WITH CONVERSION TO TOTAL HIP ARTHOPLASTY, anterior approach;  Surgeon: Renette Butters, MD;  Location: [place];  Service: Orthopedics;  Laterality: Right;   Family History  Problem Relation Age of Onset  . Heart failure Mother   .  Heart attack Father    Social History   Tobacco Use  . Smoking status: Former Smoker    Packs/day: 1.50    Years: 30.00    Pack years: 45.00    Types: E-cigarettes, Cigarettes    Last attempt to quit: 2013    Years since quitting: 7.0  . Smokeless tobacco: Never Used  Substance Use Topics  . Alcohol use: No    Alcohol/week: 0.0 standard drinks  . Drug use: No      Review of Systems Per HPI    Objective:   Physical Exam Vitals signs reviewed.  Constitutional:      Appearance: She is ill-appearing.  HENT:     Head: Normocephalic and atraumatic.  Cardiovascular:     Rate and Rhythm: Normal rate and regular rhythm.     Pulses: Normal pulses.     Heart sounds: Normal heart sounds.  Musculoskeletal:     Comments: Seated in  wheelchair. Tenderness of right upper leg to palpation. Muscle atrophy.   Neurological:     Mental Status: She is alert. Mental status is at baseline.  Psychiatric:        Mood and Affect: Mood is anxious. Affect is tearful.        Speech: Speech normal.        Behavior: Behavior normal.       BP 140/86 (BP Location: Right Arm, Patient Position: Sitting, Cuff Size: Large)   Pulse (!) 103   Temp 97.7 F (36.5 C) (Oral)   Ht 5\' 2"  (1.575 m)   Wt 168 lb (76.2 kg)   SpO2 96%   BMI 30.73 kg/m  Wt Readings from Last 3 Encounters:  06/12/18 168 lb (76.2 kg)  05/25/18 166 lb 9.6 oz (75.6 kg)  05/17/18 166 lb 9.6 oz (75.6 kg)       Assessment & Plan:  1. Right leg pain - chronic, awaiting appointment with pain management and follow up with ortho - NCCSD reviewed and no red flags - discussed possible side effects of narcotic pain medicaionincluding sedation, increased risk of falls, dizziness/lightheadedness, respiratory depression - oxyCODONE-acetaminophen (PERCOCET/ROXICET) 5-325 MG tablet; Take 1-2 tablets by mouth every 6 (six) hours as needed for up to 5 days for severe pain.  Dispense: 40 tablet; Refill: 0  2. Non-intractable vomiting with nausea, unspecified vomiting type - ondansetron (ZOFRAN ODT) 4 MG disintegrating tablet; Take 1 tablet (4 mg total) by mouth every 8 (eight) hours as needed for nausea or vomiting.  Dispense: 30 tablet; Refill: 0   Clarene Reamer, FNP-BC  [facility]  06/13/2018 8:52 AM

## 2018-06-12 NOTE — Patient Instructions (Addendum)
I am sorry your pain is so bad  I have sent in some pain medication to your pharmacy, take as sparingly as possible, avoid getting constipated  I have sent in anti nausea medication, may cause constipation  Drink enough fluids to keep urine light yellow

## 2018-06-13 ENCOUNTER — Encounter: Payer: Self-pay | Admitting: Family Medicine

## 2018-06-18 IMAGING — CT CT HIP*R* W/O CM
2 of 3 series · 17 of 46 positions shown, 19 images · non-contrast
Comparison: 09/13/2016 radiograph

CLINICAL DATA: Right hip fracture

EXAM:
CT OF THE RIGHT HIP WITHOUT CONTRAST
TECHNIQUE: Multidetector CT imaging of the right hip was performed according to
the standard protocol. Multiplanar CT image reconstructions were
also generated.

[Series 5: hip 2.0 st · axial · 0.38mm/px · z∈[+823,+1005]mm · 14 of 105 slices shown, 16 images]
[im 7/105  soft-tissue]
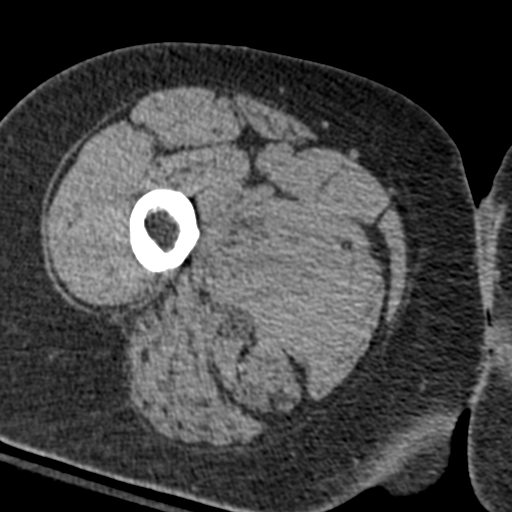
[im 7/105  bone]
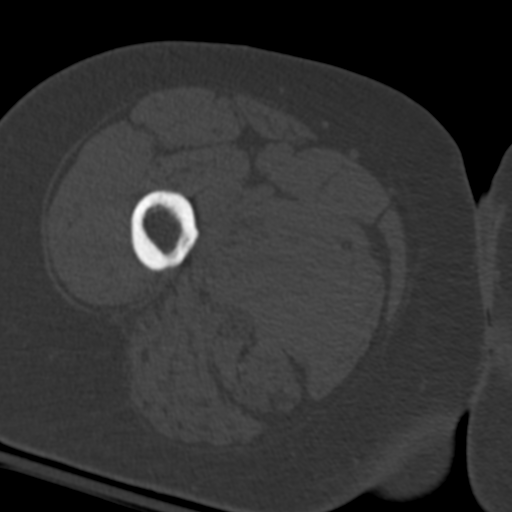
[im 14/105  soft-tissue]
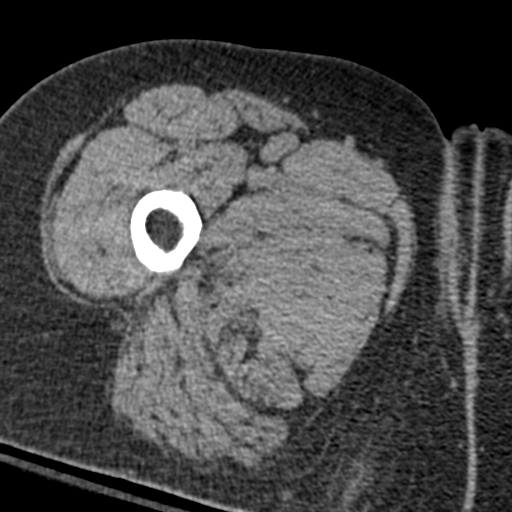
[im 21/105  soft-tissue]
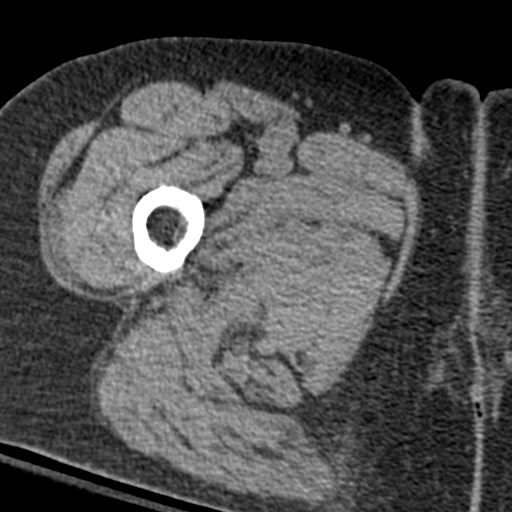
[im 27/105  soft-tissue]
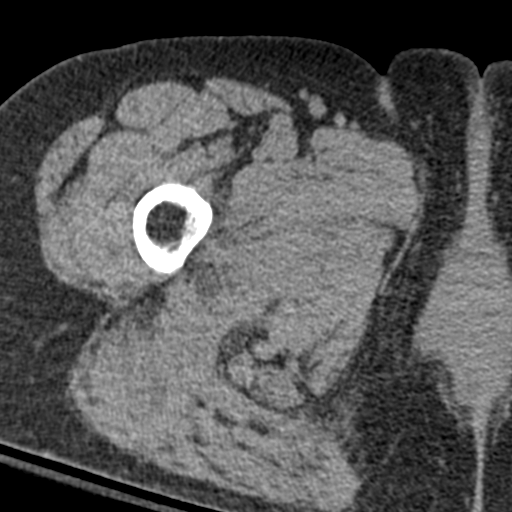
[im 34/105  soft-tissue]
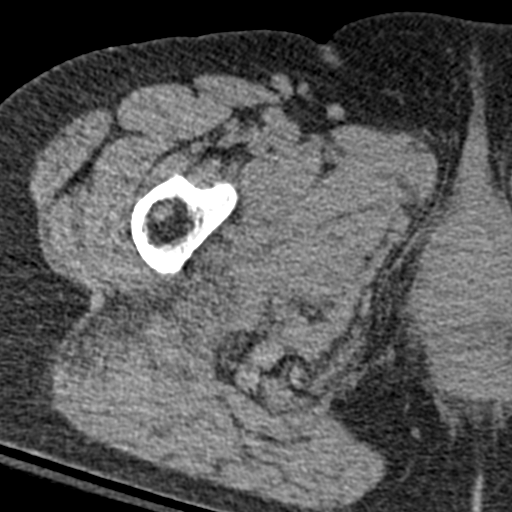
[im 41/105  soft-tissue]
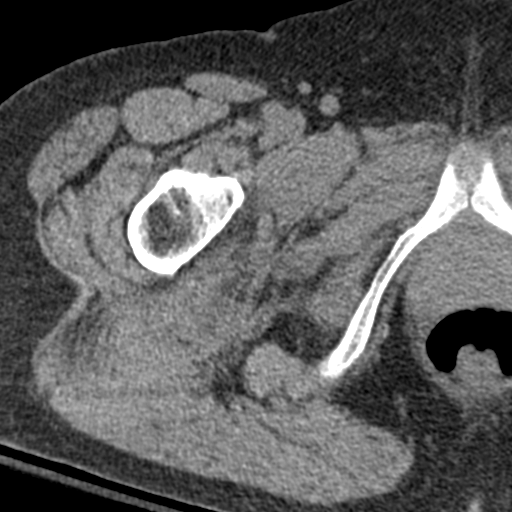
[im 47/105  soft-tissue]
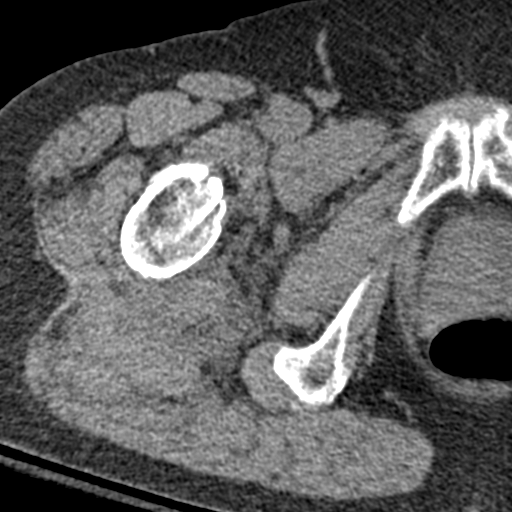
[im 58/105  soft-tissue]
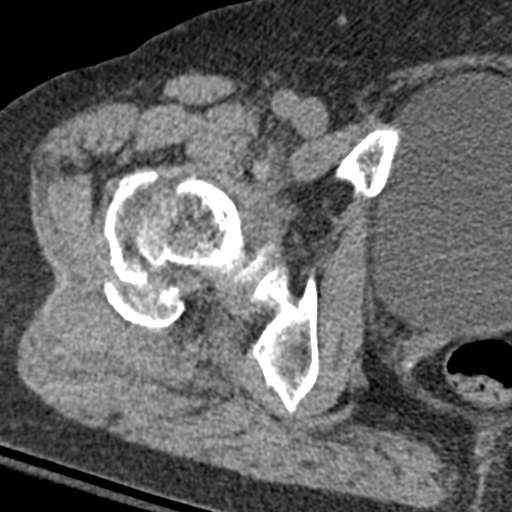
[im 64/105  soft-tissue]
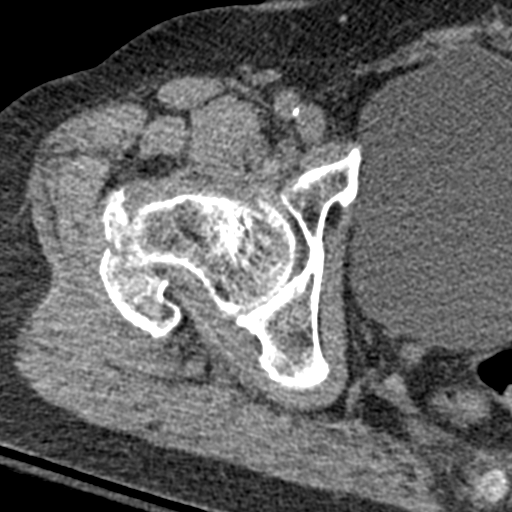
[im 64/105  bone]
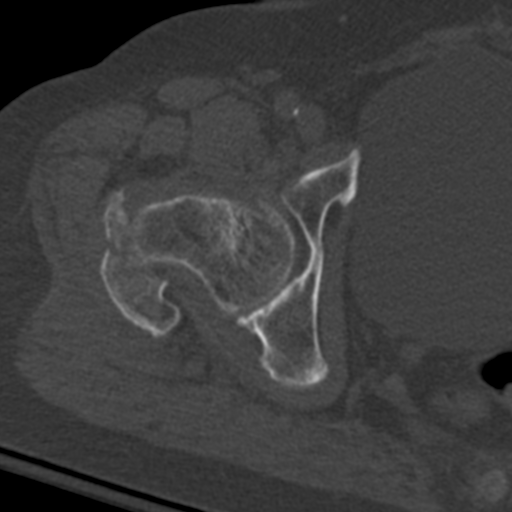
[im 71/105  soft-tissue]
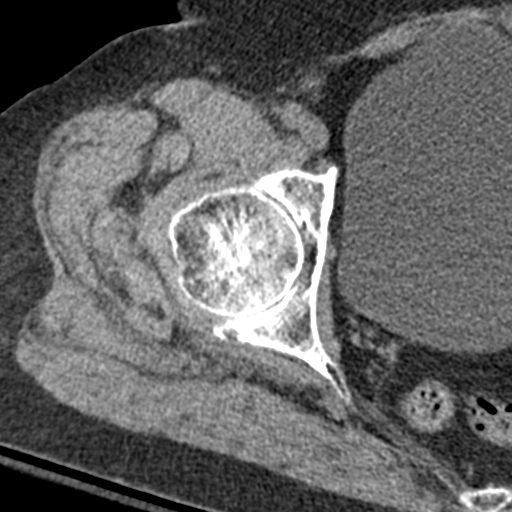
[im 78/105  soft-tissue]
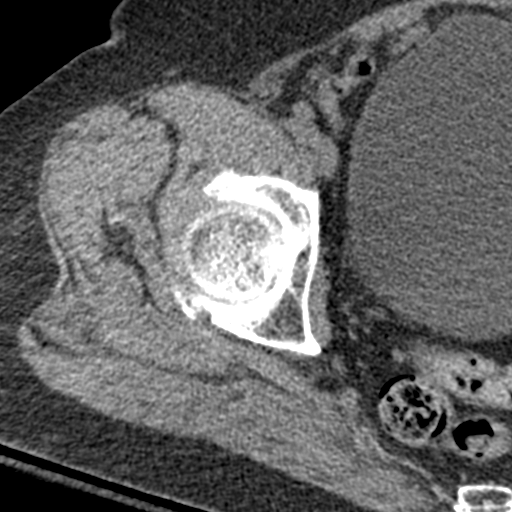
[im 84/105  soft-tissue]
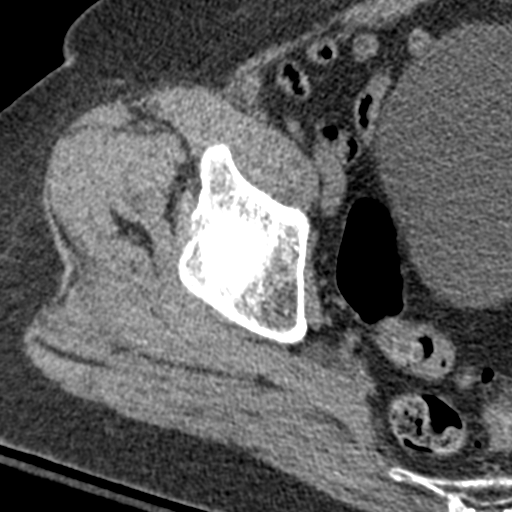
[im 91/105  soft-tissue]
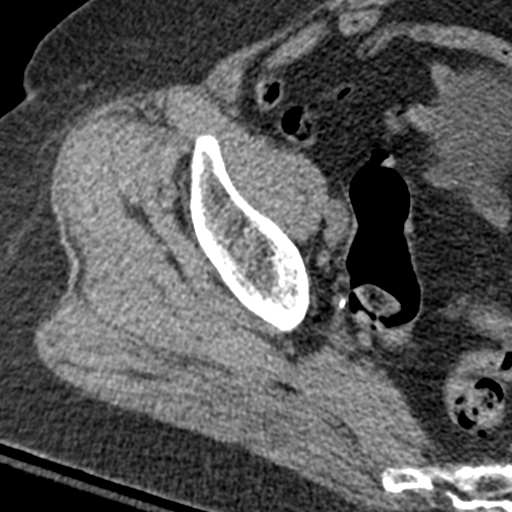
[im 98/105  soft-tissue]
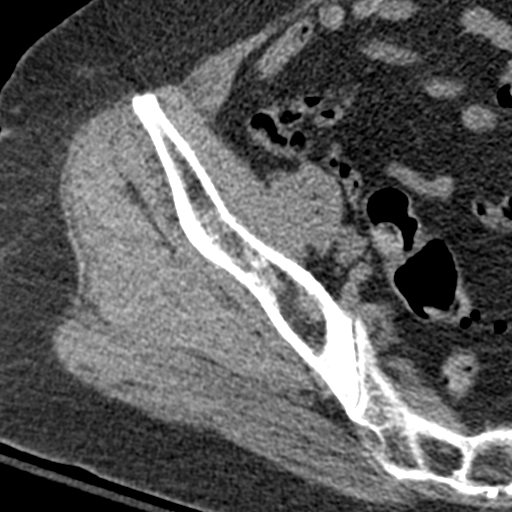

[Series 6: hip 2.0 cor. bone · coronal · 0.41mm/px · 3 of 79 slices shown]
[im 27/79  soft-tissue]
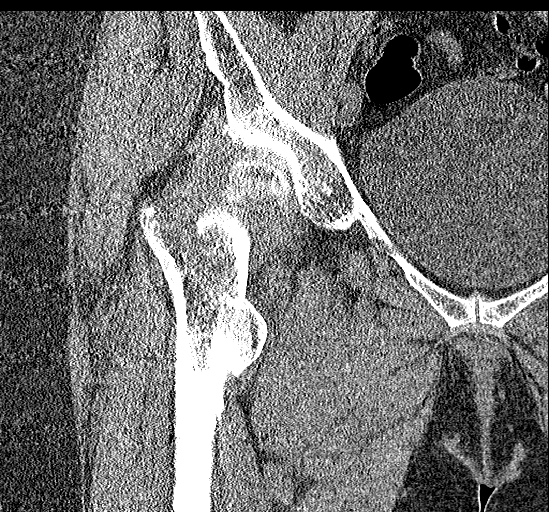
[im 35/79  soft-tissue]
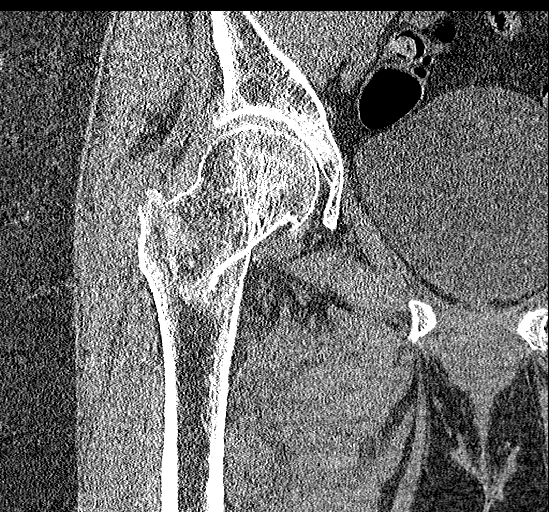
[im 44/79  soft-tissue]
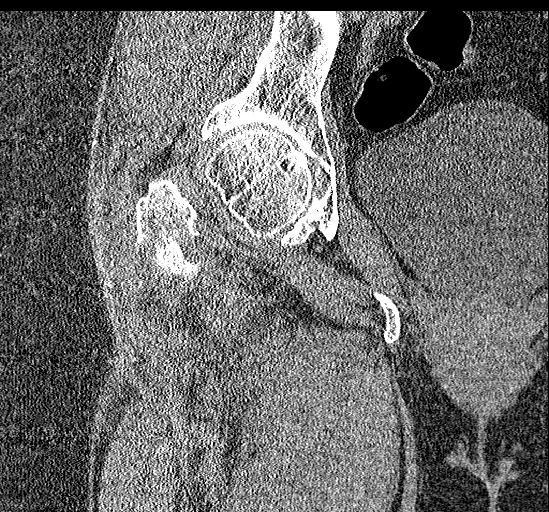

[17 of 46 positions shown; findings below may reference images not displayed]

FINDINGS: Bones/Joint/Cartilage

Intertrochanteric right hip fracture noted with mild comminution
along the greater trochanter an along the base of the left femoral
neck. The fracture extends into the upper margin of the lesser
trochanter. There is likely in the intra-articular portion involving
the [place] cervical region.

Mild spurring of the right femoral head and acetabulum with
confluent subcortical cysts along the posterosuperior acetabulum.

Ligaments

Suboptimally assessed by CT.

Muscles and Tendons

Unremarkable

Soft tissues

Mild external iliac artery atherosclerotic calcification.
IMPRESSION: 1. Mildly displaced intertrochanteric fracture with comminuted
portion along the greater trochanter and minimal comminution along
the basicervical region of the left femoral neck. There could be a
small component of intra-articular basicervical involvement. Mild
apex anterior angulation of the dominant fracture site as shown on
image [DATE].

## 2018-06-18 IMAGING — RF DG FEMUR 2+V*R*
1 series · 3 of 3 positions shown · non-contrast
Comparison: Plain films and CT scan right hip this same day.

CLINICAL DATA: Intraoperative fluoroscopic imaging for fixation of
a right hip fracture suffered in a fall today.

EXAM:
DG C-ARM 61-120 MIN; RIGHT FEMUR 2 VIEWS

[Series 1: run · 3 of 3 slices shown]
[im 1/3]
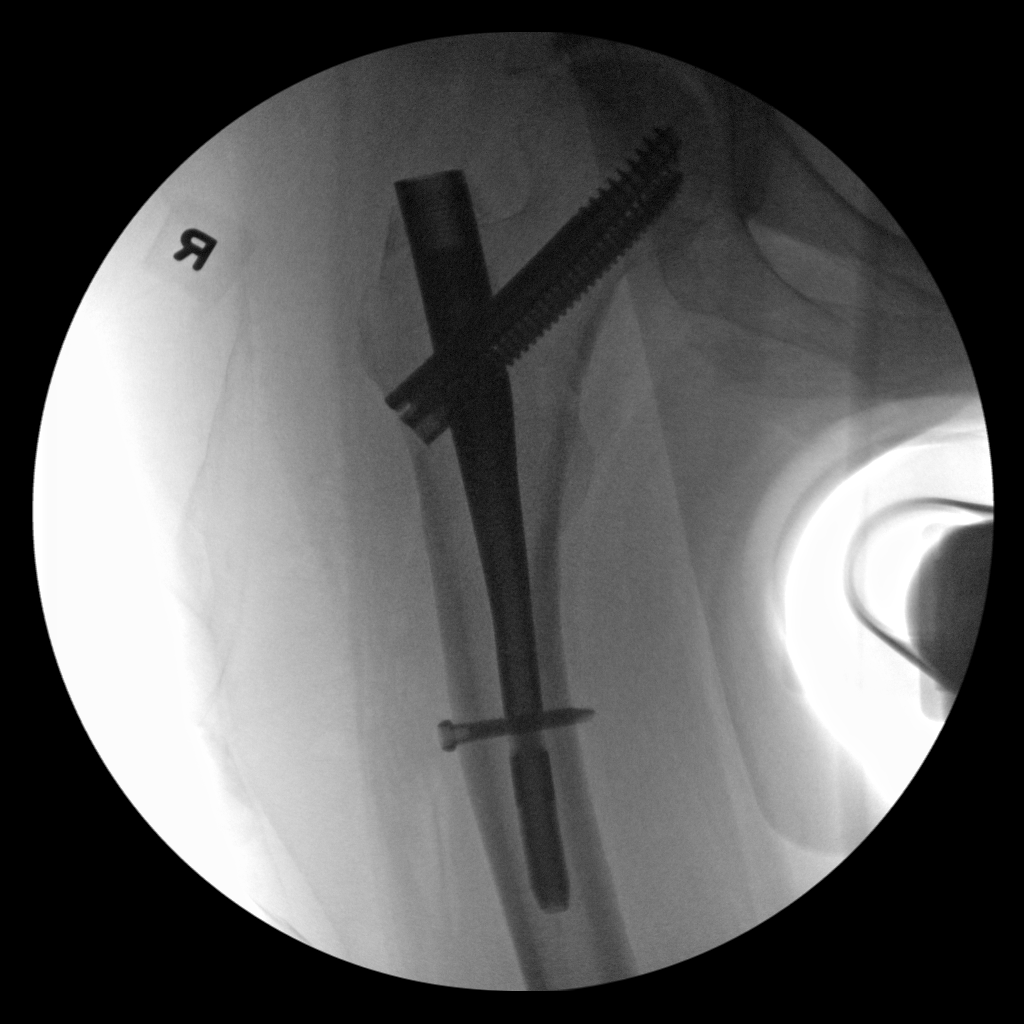
[im 2/3]
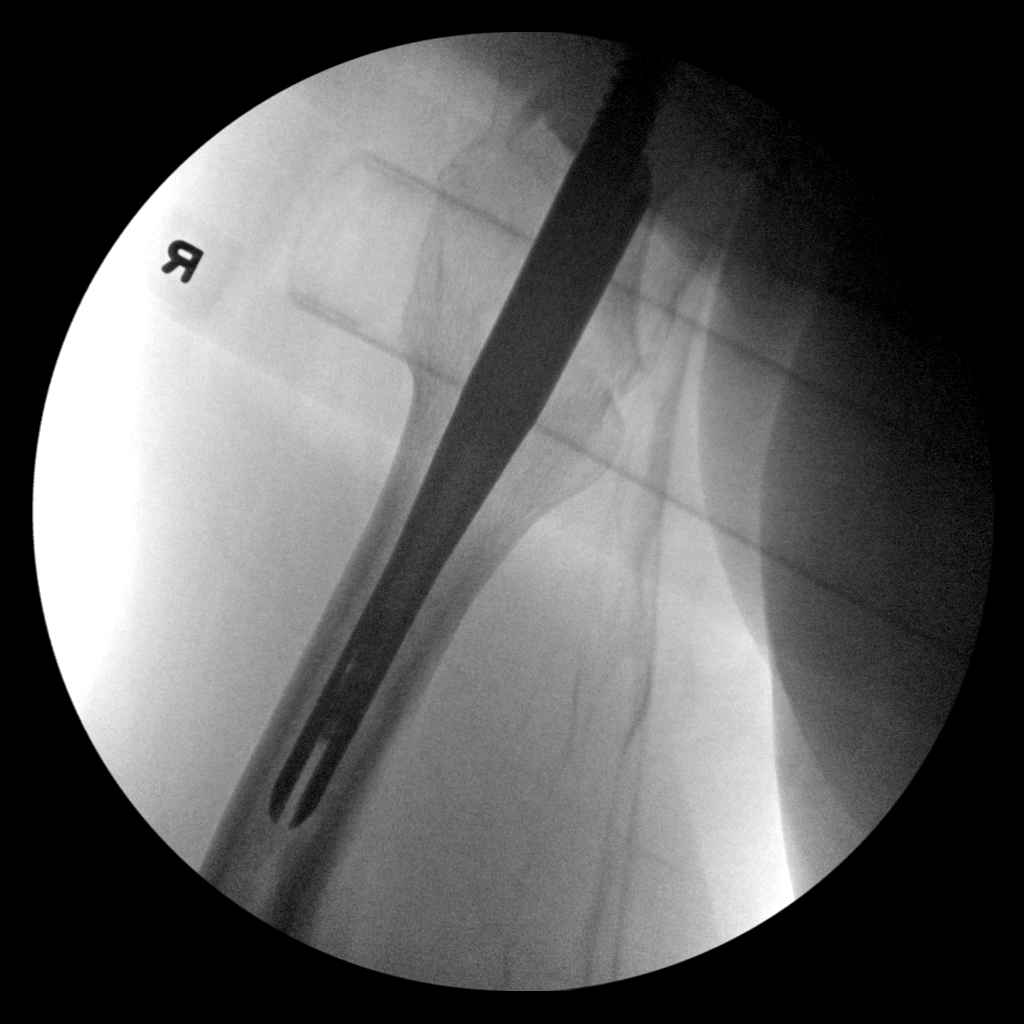
[im 3/3]
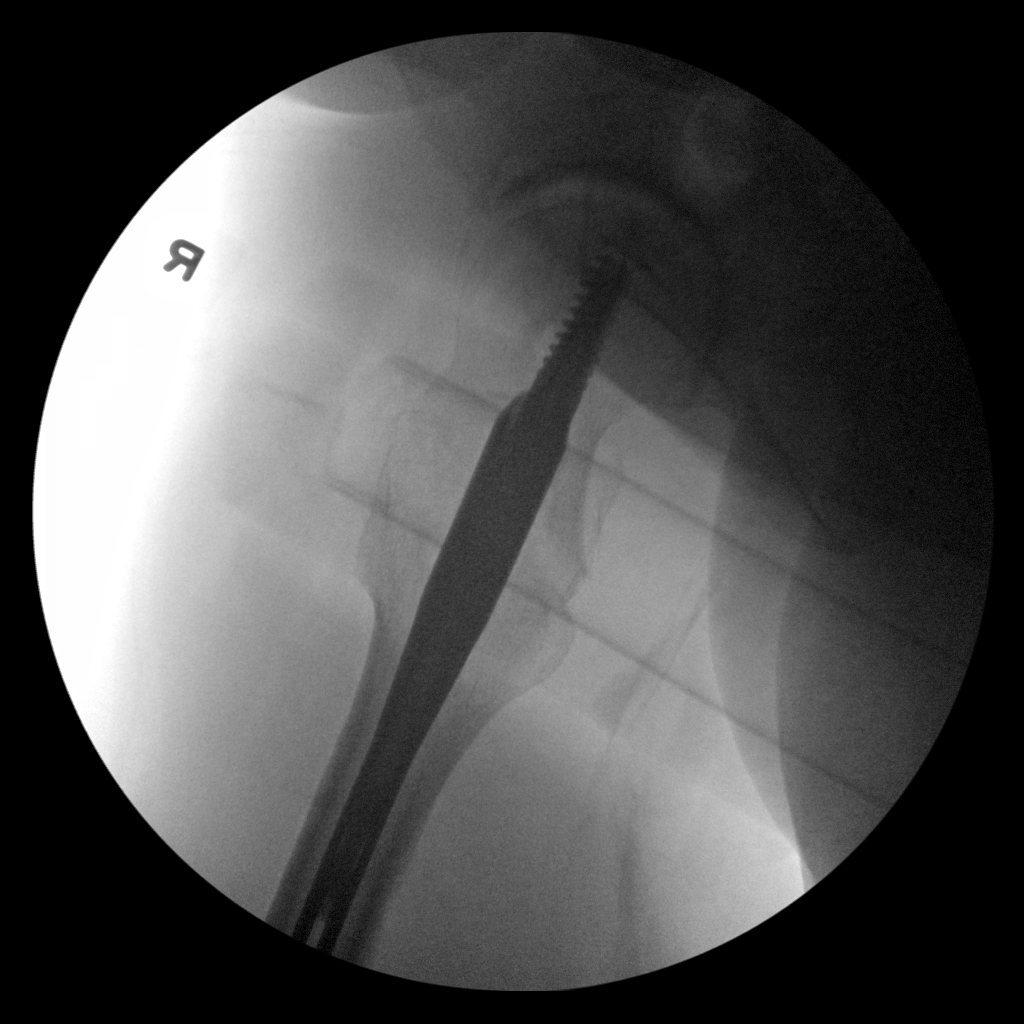

[3 of 3 positions shown; findings below may reference images not displayed]

FINDINGS: Three fluoroscopic intraoperative spot views of the right hip are
provided. Images demonstrate a short intramedullary nail and 2
proximal screws in place across the intertrochanteric fracture.
Position and alignment appear anatomic. No acute abnormality.
IMPRESSION: ORIF right intertrochanteric fracture.  No acute finding.

## 2018-06-18 IMAGING — DX DG CHEST 1V
1 series · 1 of 1 positions shown · non-contrast
Comparison: Radiograph 06/17/2016

CLINICAL DATA: Fall. Dizziness leading to fall walking to the
restroom.

EXAM:
CHEST 1 VIEW

[chest ap]
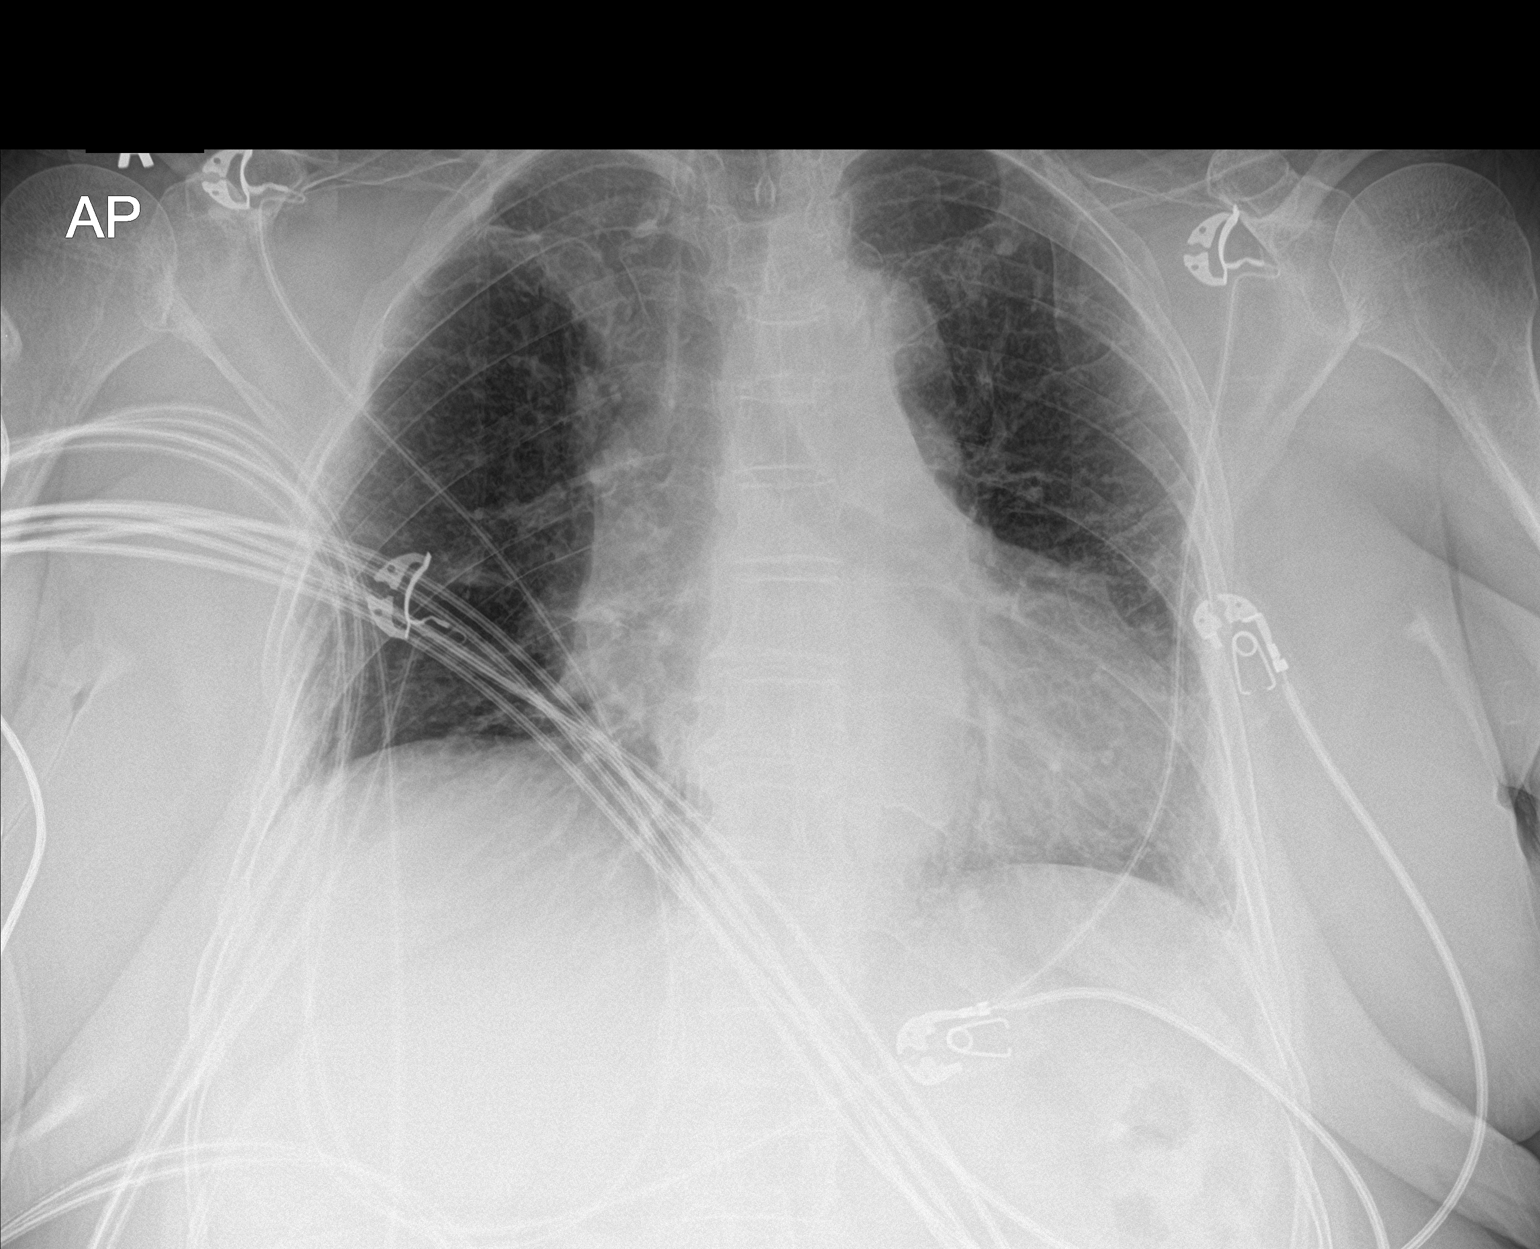

[1 of 1 positions shown; findings below may reference images not displayed]

FINDINGS: Stable borderline mild cardiomegaly. Stable tortuosity of the
thoracic aorta. No pulmonary edema. No evidence of pneumothorax. No
confluent airspace disease or pleural fluid. No acute osseous
abnormalities are seen. Calcified granuloma again seen. No acute
osseous abnormalities.
IMPRESSION: No acute abnormality.

## 2018-06-18 IMAGING — DX DG PELVIS 1-2V
1 series · 1 of 1 positions shown · non-contrast
Comparison: 09/17/2012

CLINICAL DATA: Patient became dizzy and fell, striking head and
right hip. Severe right hip pain.

EXAM:
PELVIS - 1-2 VIEW

[pelvis ap]
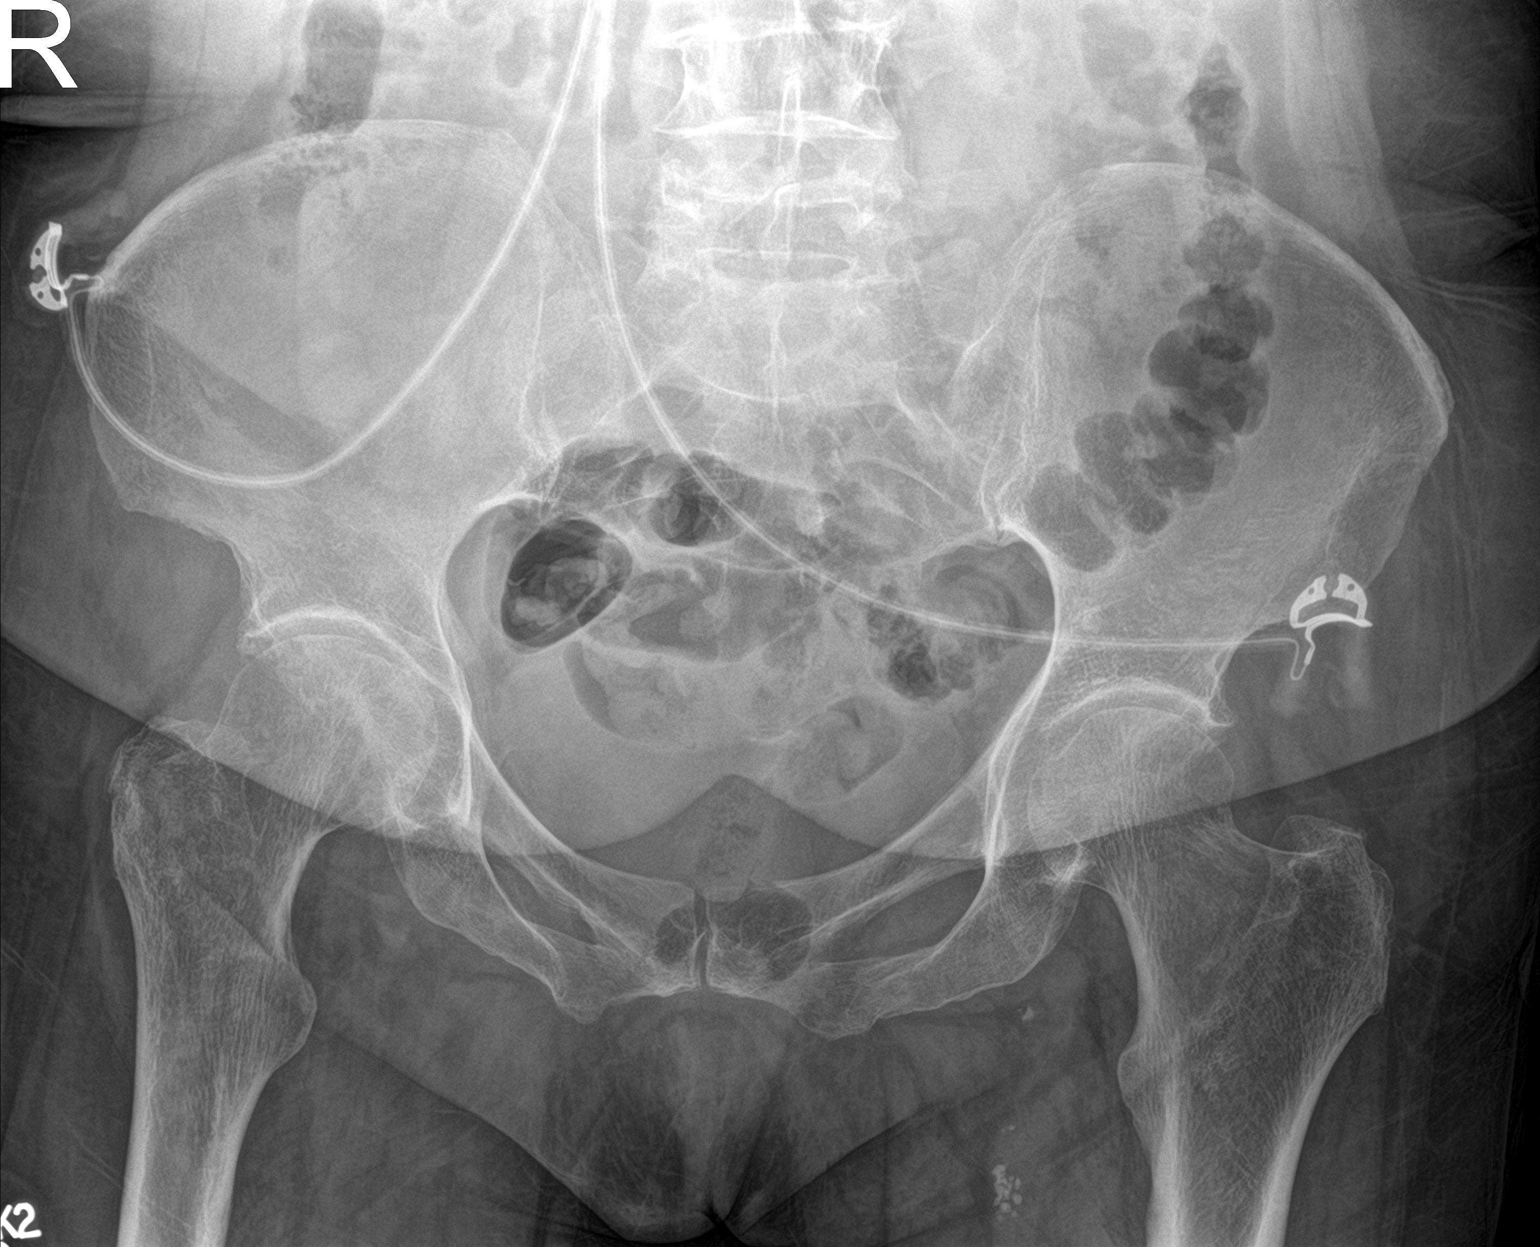

[1 of 1 positions shown; findings below may reference images not displayed]

FINDINGS: Cortical irregularity along the greater trochanter of the right hip
probably represents nondisplaced fracture. No dislocation of the
right hip. Diffuse bone demineralization. Pelvis appears intact. SI
joints and symphysis pubis are not displaced.
IMPRESSION: Cortical irregularity along the greater trochanter of the right hip
probably represents nondisplaced fracture.

## 2018-06-18 IMAGING — CT CT CERVICAL SPINE W/O CM
5 of 8 series · 14 of 33 positions shown, 15 images · non-contrast
Comparison: CT head 07/02/2016. CT head and cervical spine
03/26/2016

CLINICAL DATA: Fall.

EXAM:
CT HEAD WITHOUT CONTRAST
CT CERVICAL SPINE WITHOUT CONTRAST
TECHNIQUE: Multidetector CT imaging of the head and cervical spine was
performed following the standard protocol without intravenous
contrast. Multiplanar CT image reconstructions of the cervical spine
were also generated.

[Series 4: head bone · axial · 0.44mm/px · z∈[-566,-510]mm · 2 of 84 slices shown]
[im 28/84  bone]
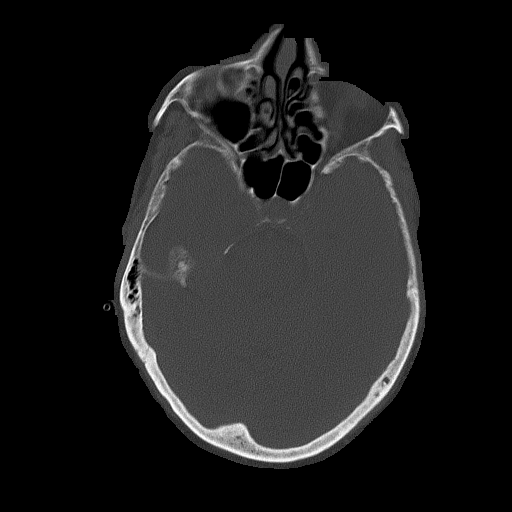
[im 56/84  bone]
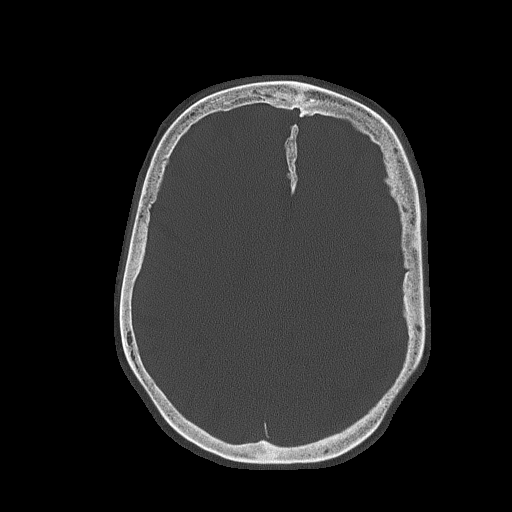

[Series 8: c_spine 2.0 st · axial · 0.31mm/px · z∈[-697,-641]mm · 2 of 86 slices shown, 3 images]
[im 29/86  soft-tissue]
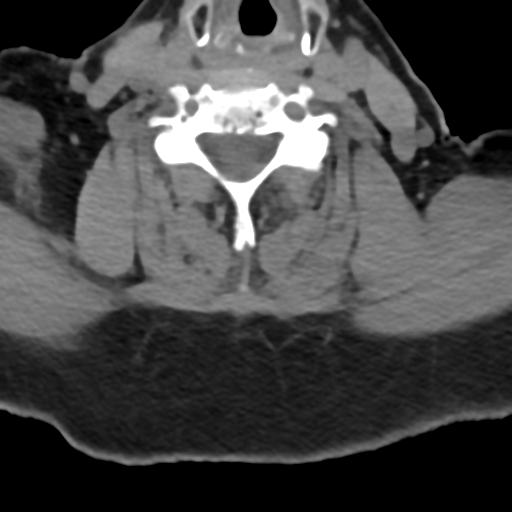
[im 29/86  bone]
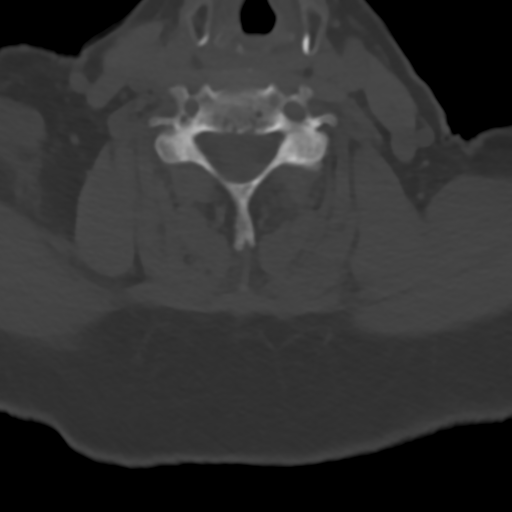
[im 57/86  bone]
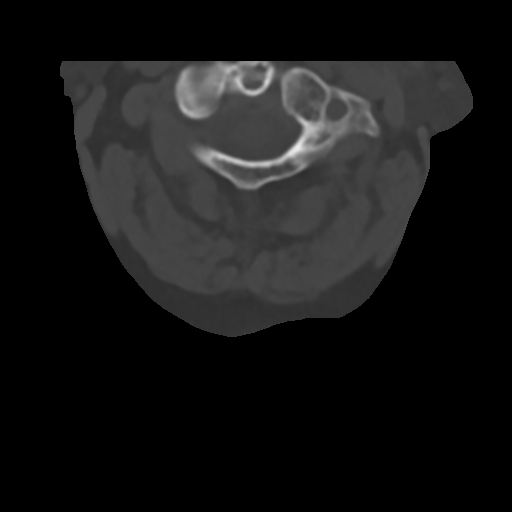

[Series 10: c_spine 2.0 sag bone · sagittal · 0.25mm/px · 5 of 61 slices shown]
[im 11/61  bone]
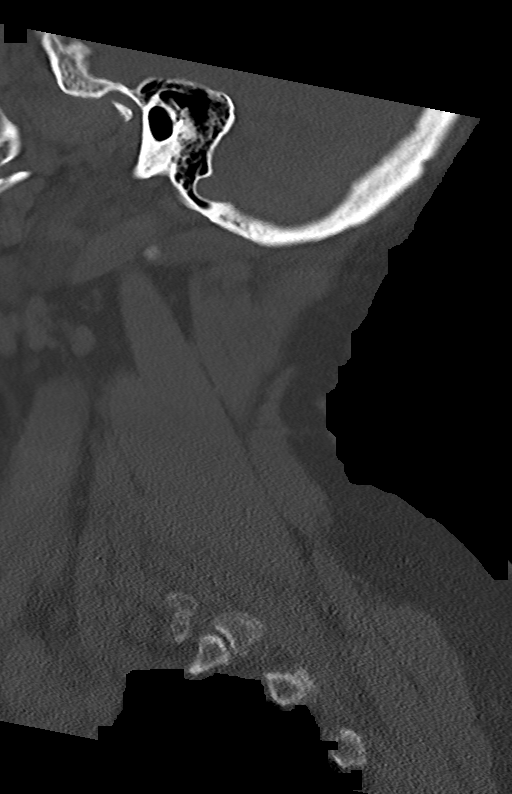
[im 21/61  bone]
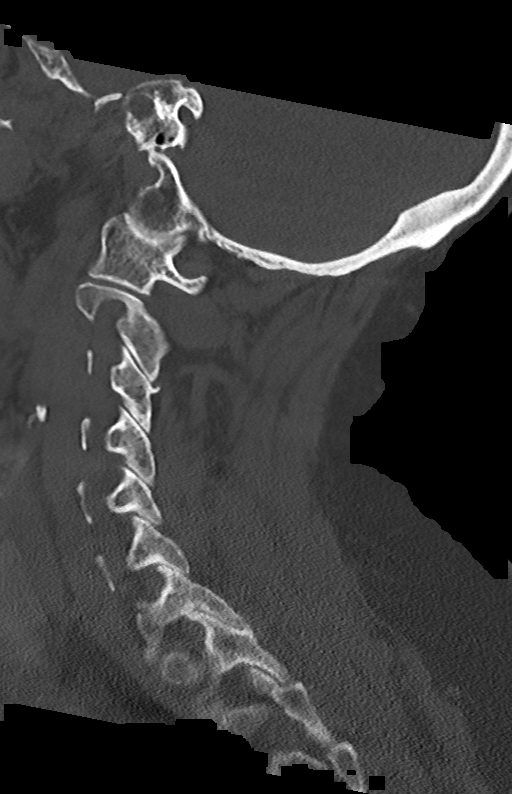
[im 31/61  bone]
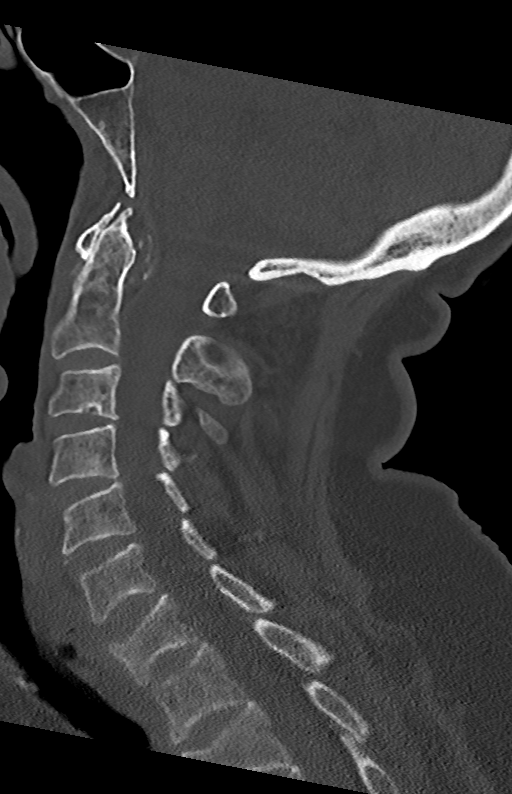
[im 41/61  bone]
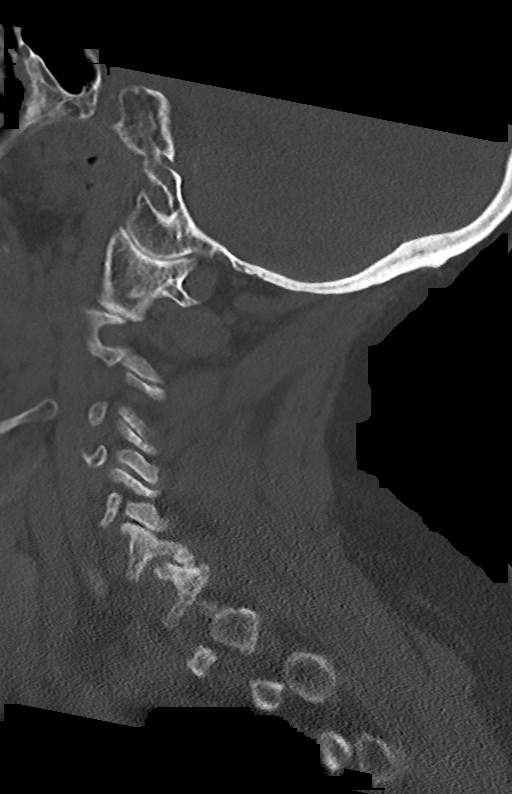
[im 51/61  bone]
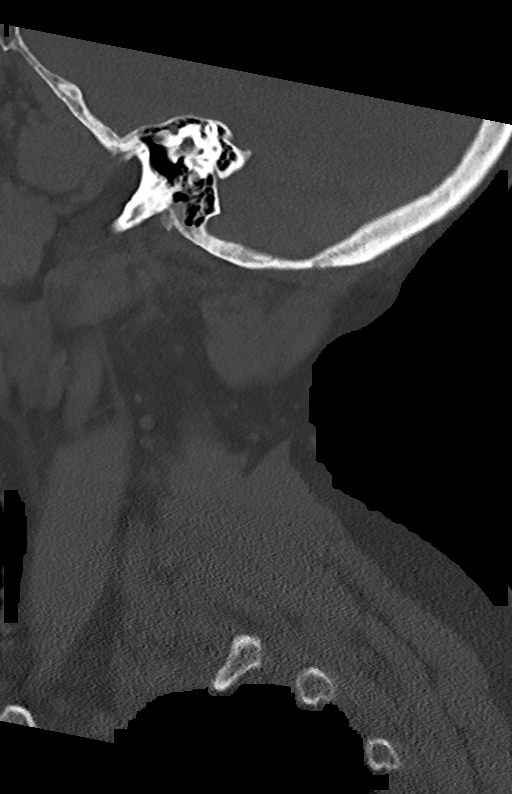

[Series 11: c_spine 2.0 cor bone · coronal · 0.25mm/px · 3 of 64 slices shown]
[im 16/64  bone]
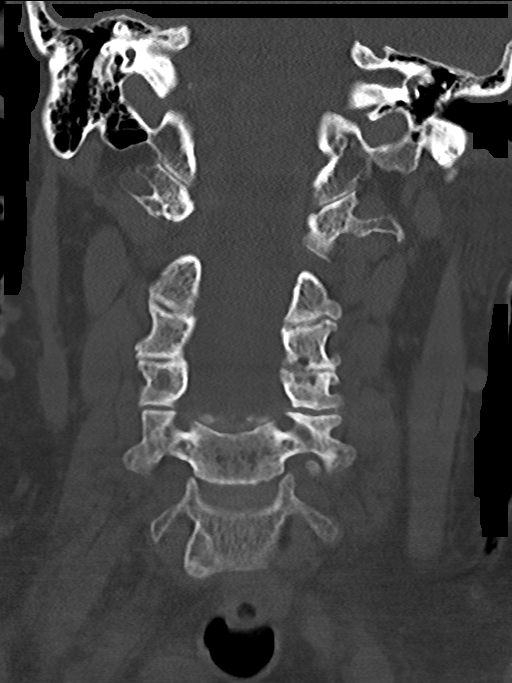
[im 32/64  bone]
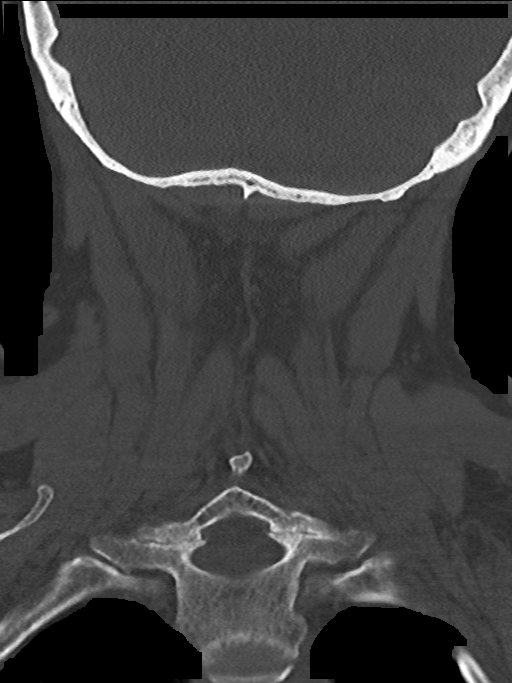
[im 48/64  bone]
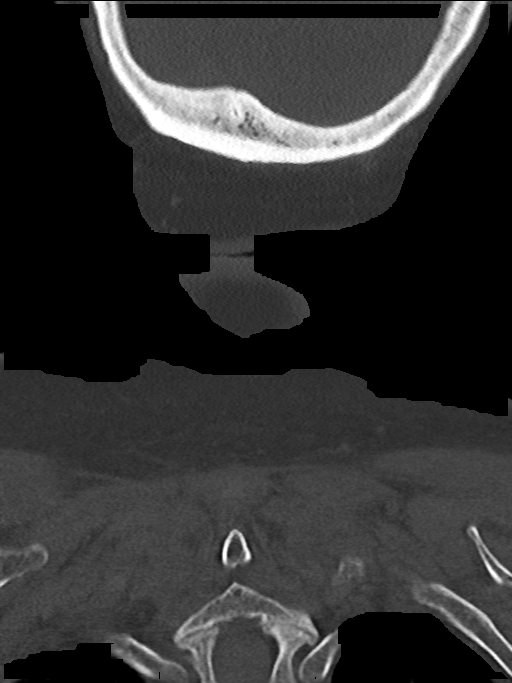

[Series 14: c_spine 2.0 orthogonals · axial · 0.21mm/px · z∈[-723,-660]mm · 2 of 79 slices shown]
[im 27/79  bone]
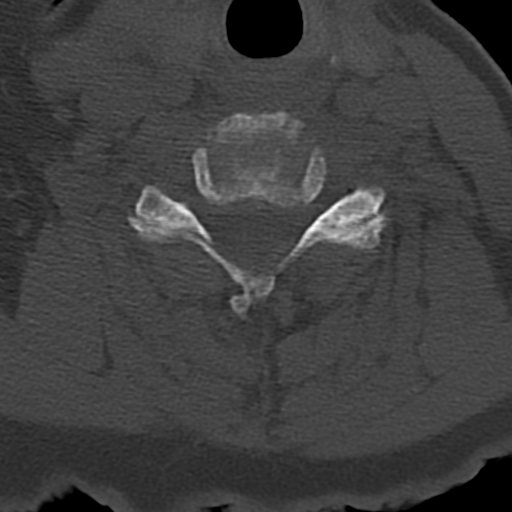
[im 53/79  bone]
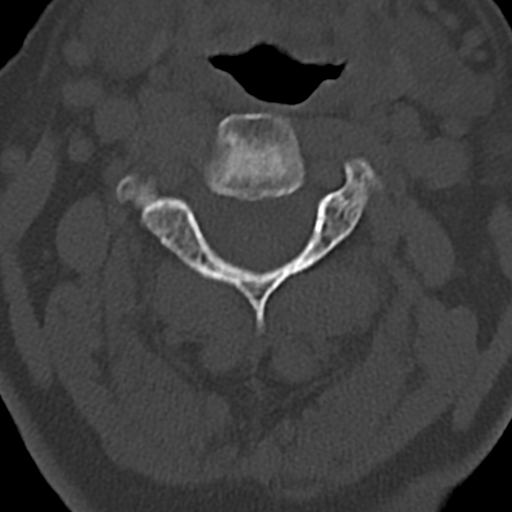

[14 of 33 positions shown; findings below may reference images not displayed]

FINDINGS: CT HEAD FINDINGS

Brain: No evidence of acute infarction, hemorrhage, hydrocephalus,
extra-axial collection or mass lesion/mass effect. Diffuse cerebral
atrophy. Ventricular dilatation consistent with central atrophy.
Low-attenuation changes in the deep white matter consistent small
vessel ischemia. Calcification in the basal ganglia.

Vascular: No hyperdense vessel or unexpected calcification.

Skull: Normal. Negative for fracture or focal lesion.

Sinuses/Orbits: No acute finding.

Other: No significant change since previous study.

CT CERVICAL SPINE FINDINGS

Alignment: Normal alignment of the cervical spine and facet joints.

Skull base and vertebrae: Skullbase appears intact. Endplate
compression deformities at C7 and T2 without change since prior
study. No focal bone lesion or bone destruction.

Soft tissues and spinal canal: No prevertebral fluid or swelling. No
visible canal hematoma.

Disc levels: Disc space heights are preserved. Degenerative changes
in the cervical facet joints and at C1 -2.

Upper chest: Visualized lung apices are clear.

Other: Degenerative changes in the temporomandibular joints.
IMPRESSION: No acute intracranial abnormalities. Chronic atrophy and small
vessel ischemic changes.

Normal alignment of the cervical spine. Chronic endplate compression
deformities at C7 and T2 unchanged since previous study, likely
representing osteoporosis. Mild degenerative changes. No acute
displaced fractures identified.

## 2018-06-25 ENCOUNTER — Ambulatory Visit (INDEPENDENT_AMBULATORY_CARE_PROVIDER_SITE_OTHER): Payer: Medicare Other | Admitting: Family Medicine

## 2018-06-25 ENCOUNTER — Other Ambulatory Visit: Payer: Self-pay

## 2018-06-25 ENCOUNTER — Ambulatory Visit (INDEPENDENT_AMBULATORY_CARE_PROVIDER_SITE_OTHER)
Admission: RE | Admit: 2018-06-25 | Discharge: 2018-06-25 | Disposition: A | Discharge status: Home / Self Care | Payer: Medicare Other | Source: Ambulatory Visit | Attending: Family Medicine | Admitting: Family Medicine

## 2018-06-25 ENCOUNTER — Encounter: Payer: Self-pay | Admitting: Family Medicine

## 2018-06-25 VITALS — BP 130/90 | HR 80 | Temp 97.6°F | Ht 62.0 in

## 2018-06-25 DIAGNOSIS — G8929 Other chronic pain: Secondary | ICD-10-CM

## 2018-06-25 DIAGNOSIS — I2699 Other pulmonary embolism without acute cor pulmonale: Secondary | ICD-10-CM

## 2018-06-25 DIAGNOSIS — M792 Neuralgia and neuritis, unspecified: Secondary | ICD-10-CM

## 2018-06-25 DIAGNOSIS — M7918 Myalgia, other site: Secondary | ICD-10-CM

## 2018-06-25 DIAGNOSIS — S72141S Displaced intertrochanteric fracture of right femur, sequela: Secondary | ICD-10-CM

## 2018-06-25 DIAGNOSIS — S72001S Fracture of unspecified part of neck of right femur, sequela: Secondary | ICD-10-CM

## 2018-06-25 MED ORDER — PREGABALIN 100 MG PO CAPS: 100.0000 mg | ORAL_CAPSULE | Freq: Two times a day (BID) | ORAL | 5 refills | Status: DC

## 2018-06-25 MED ORDER — OXYCODONE-ACETAMINOPHEN 5-325 MG PO TABS: 1.0000 | ORAL_TABLET | Freq: Four times a day (QID) | ORAL | 0 refills | Status: DC | PRN

## 2018-06-25 NOTE — Telephone Encounter (Signed)
Received refill request from [facility] in [place] on Trazodone and Orphenadrine. I called [name], ok per DPR, and spoke with her in regards to this. (  1) Trazodone 100 mg-per pharmacy this has not been filled since March 2019 by her previous provider and pharmacy said they received a note that patient was taking off this medication in 2019. Per [name] they are not aware of this and patient has been taking Trazodone 100 mg every night. She has been taking 1/2 tablet each time to make it last. No one else has filled this medication since March 2019. Patient requests refill.  2) Also discussed Orphenadrine ER 100 mg 1 twice daily. This was prescribed last by Dr. Alvester Chou. She took her last one yesterday. She uses this for chronic muscle spasms in back and hip areas past surgeries in the past. I advised [name] I would send it to Nicole Reamer, NP who will be back tomorrow to review and see how to proceed.  FYI: [name] also said that patient is going to be going to [facility] office in [place] due to it is hard to get to [place]. She is going to be seen an orthopedic and pain management provider there and will not be going to see Dr. Dossie Arbour at [facility] anymore.

## 2018-06-25 NOTE — Patient Instructions (Signed)
Dr. Duayne Cal is the total joint specialist in [place] who takes care of the complex joint replacement cases.  - He may be willing to see you given driving limitations and complexity of your hip case.

## 2018-06-25 NOTE — Progress Notes (Signed)
Dr. Karleen Hampshire T. Shirley Decamp, MD, CAQ Sports [facility] [address] [place], [address] Phone: [phone] Fax: [phone]  06/25/2018  Patient: [name], MRN: [id number], DOB: 1940-11-02, 78 y.o.  Primary Physician:  Emi Belfast, FNP   Chief Complaint  Patient presents with  . Hip Pain    Right-Hip Replacement May 2019   Subjective:   [name] is a 78 y.o. very pleasant female patient who presents with the following:  This is a complex hip replacement case done by Dr. Renaye Rakers, we are on Sep 30, 2017, the patient had an IM nail removed and was converted to total hip arthroplasty via the anterior approach.  She subsequently has had a number of complications including immediately postoperative of fecal impaction and ileus.  This was done after an intertrochanteric hip fracture on the right side.  Initial fx surgery done my Dr. Roda Shutters. 08/2016, IM Rod She had a knee replacement done by Dr. Sherlean Foot.  It appears that she has seen Dr. Laban Emperor for chronic pain management from her hip as well.  She has had multiple ER visits over the last few months for acute hip pain. It looks like she had least called also Dr. Cheri Fowler for a second opinion as well regarding her hip.  She had an admission in April 20, 2018 for community-acquired pneumonia. It also looks as if she has an acute pulmonary embolism. She is currently on Eliquis.  She also was admitted it appears due to sepsis on May 17, 2018.  She also had an additional fall on May 25, 2018.  She heard a pop and had pain in her R hip 3 days. Felt something snap.  She is crying today in the office and she is in a wheelchair.  She reports that she uses a wheelchair or very slowly uses a walker at baseline.  She felt some scar tissue snap or pop in the anterior aspect of her hip.  She is having more trouble walking.  Dr. Eulah Pont referred her to Dr. Linna Caprice, and he saw her 2  weeks ago.  She was supposed to start physical therapy today.  Saw [name] 2 weeks ago.   Past Medical History, Surgical History, Social History, Family History, Problem List, Medications, and Allergies have been reviewed and updated if relevant.  Patient Active Problem List   Diagnosis Date Noted  . COPD with acute exacerbation (HCC) 05/17/2018  . Pressure injury of skin 05/04/2018  . PE (pulmonary thromboembolism) (HCC) 04/30/2018  . Acute metabolic encephalopathy   . Respiratory failure with hypoxia (HCC) 04/20/2018  . CAP (community acquired pneumonia) 04/20/2018  . Acute lower UTI 04/20/2018  . Chronic lower extremity pain (Right) 04/07/2018  . Abnormal MRI, lumbar spine 04/07/2018  . Lumbar facet hypertrophy (Multilevel) (Bilateral) 04/07/2018  . Lumbar foraminal stenosis (L3-4, L4-5) (Bilateral) 04/07/2018  . Annular tear of lumbar disc (L3-4, L4-5) 04/07/2018  . Closed hip fracture, sequela (Right) 02/03/2018  . It band syndrome, right 12/18/2017  . History of hip replacement (Right) 12/18/2017  . Ileus (HCC) 10/27/2017  . Primary osteoarthritis of hip 09/30/2017  . History of small bowel obstruction 09/10/2017  . 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 SBO (small bowel obstruction) (HCC) 08/24/2017  . Abnormal x-ray of pelvis 08/13/2017  . Other specified dorsopathies, sacral and sacrococcygeal region 08/04/2017  . Spondylosis without myelopathy or radiculopathy, lumbosacral region 07/17/2017  . Non-traumatic compression  fracture of T3 thoracic vertebra, sequela 07/02/2017  . High risk medication use 07/02/2017  . At high risk for falls 07/02/2017  . Chronic sacroiliac joint pain (Right) 07/02/2017  . CKD (chronic kidney disease) stage 3, GFR 30-59 ml/min (HCC) 04/23/2017  . Chronic knee pain s/p total knee replacement (TKR) (Right) 04/02/2017  . DDD (degenerative disc disease), lumbar 03/05/2017  . Chronic knee pain (Bilateral) (R>L)  03/05/2017  . Osteoarthritis 03/05/2017  . Chronic musculoskeletal pain 03/05/2017  . Neurogenic pain 03/05/2017  . Chronic myofascial pain 02/12/2017  . Lumbar facet syndrome (Bilateral) (L>R) 02/12/2017  . Long term prescription benzodiazepine use 02/12/2017  . Opiate use 02/12/2017  . Disorder of skeletal system 02/12/2017  . Pharmacologic therapy 02/12/2017  . Problems influencing health status 02/12/2017  . 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 NSAID long-term use 02/12/2017  . DDD (degenerative disc disease), cervical 11/11/2016  . Lumbar facet arthropathy (Bilateral) 11/11/2016  . 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 CAFL (chronic airflow limitation) (HCC) 11/07/2016  . Depression, major, in partial remission (HCC) 11/07/2016  . 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 Bed wetting 11/07/2016  . Long term current use of opiate analgesic 11/07/2016  . Long term prescription opiate use 11/07/2016  . Chronic pain syndrome 11/07/2016  . Chronic neck pain (Secondary Area of Pain) (Bilateral) (L>R) 11/07/2016  . Chronic hip pain (Right) 11/07/2016  . Rib pain 11/07/2016  . Age-related osteoporosis with current pathological fracture with routine healing 09/20/2016  . History of stroke 09/20/2016  . Major depression in remission (HCC) 09/20/2016  . Intertrochanteric fracture of femur, sequela (Right) 09/13/2016  . Fall   . Chronic shoulder pain (Bilateral) 08/12/2016  . Hx of total knee replacement (Bilateral) 04/04/2016  . Chronic shoulder pain (Left) 02/28/2016  . Hoarseness 01/12/2016  . Pharyngoesophageal dysphagia 01/12/2016  . Chronic low back pain [facility]) (Bilateral) (L>R) 10/26/2015  . 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 Syncope, non cardiac 05/22/2015  . Thrombocytopenia (HCC) 10/07/2014  . Hypoxia 10/05/2014  .  Anxiety 06/15/2014  . Duodenogastric reflux 06/15/2014  . Benign essential HTN 06/15/2014  . Hypercholesteremia 07/30/2013  . HTN (hypertension) 07/30/2013  . GERD (gastroesophageal reflux disease) 07/30/2013  . Dizziness and giddiness 06/18/2013  . Memory loss 05/03/2013  . Chronic knee pain (Primary Area of Pain) (Right) 09/10/2012    Past Medical History:  Diagnosis Date  . Acute postoperative pain 02/03/2018  . Anemia   . B12 deficiency   . Bacteremia 10/07/2014  . Bladder incontinence   . Blind left eye   . Cancer (HCC)    skin cancer   . Cataract   . Cervical spine fracture (HCC)   . Chest pain 07/29/2013  . Compression fracture    C7,  upper T spine -compression fx  . COPD (chronic obstructive pulmonary disease) (HCC)   . DDD (degenerative disc disease), lumbar   . Depression    major  . Diffuse myofascial pain syndrome 03/24/2015  . Dyspnea    at times- when activty  . Fever 10/05/2014  . GERD (gastroesophageal reflux disease)   . Hiatal hernia   . Hyperlipidemia   . Hypertension   . 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 Macular degeneration   . On home oxygen therapy    "3L; all the time" (04/30/2018)  . Osteoarthritis   . Osteoporosis   . Pneumonia  years ago  . Pulmonary embolism (HCC) 04/30/2018  . Reactive airway disease   . Rupture of bowel (HCC)   . Sepsis (HCC) 10/08/2014  . Stroke [facility])    "mini stroke"- balance and memory issue  . Stroke (HCC)   . Wrist pain, acute 09/10/2012    Past Surgical History:  Procedure Laterality Date  . ABDOMINAL HYSTERECTOMY    . ABDOMINAL SURGERY    . APPENDECTOMY    . BLADDER SURGERY    . CATARACT EXTRACTION W/ INTRAOCULAR LENS  IMPLANT, BILATERAL Bilateral   . CHOLECYSTECTOMY    . COLON SURGERY    . COLOSTOMY REVERSAL    . INTRAMEDULLARY (IM) NAIL INTERTROCHANTERIC Right 09/13/2016   Procedure: INTRAMEDULLARY (IM) NAIL INTERTROCHANTRIC RIGHT;  Surgeon: Tarry Kos, MD;  Location: [facility] OR;   Service: Orthopedics;  Laterality: Right;  . JOINT REPLACEMENT Bilateral   . KNEE SURGERY    . OSTOMY    . RECTOCELE REPAIR    . TOE SURGERY Right    3rd  . TOTAL HIP ARTHROPLASTY Right 09/30/2017   Procedure: RIGHT HIP IM NAIL REMOVAL WITH CONVERSION TO TOTAL HIP ARTHOPLASTY, anterior approach;  Surgeon: Sheral Apley, MD;  Location: [facility] OR;  Service: Orthopedics;  Laterality: Right;    Social History   Socioeconomic History  . Marital status: Widowed    Spouse name: Not on file  . Number of children: 3  . Years of education: middle sch  . Highest education level: Not on file  Occupational History  . Occupation: retired    Comment: n/a  Social Needs  . Financial resource strain: Not on file  . Food insecurity:    Worry: Not on file    Inability: Not on file  . Transportation needs:    Medical: Not on file    Non-medical: Not on file  Tobacco Use  . Smoking status: Former Smoker    Packs/day: 1.50    Years: 30.00    Pack years: 45.00    Types: E-cigarettes, Cigarettes    Last attempt to quit: 2013    Years since quitting: 7.0  . Smokeless tobacco: Never Used  Substance and Sexual Activity  . Alcohol use: No    Alcohol/week: 0.0 standard drinks  . Drug use: No  . Sexual activity: Not Currently  Lifestyle  . Physical activity:    Days per week: Not on file    Minutes per session: Not on file  . Stress: Not on file  Relationships  . Social connections:    Talks on phone: Not on file    Gets together: Not on file    Attends religious service: Not on file    Active member of club or organization: Not on file    Attends meetings of clubs or organizations: Not on file    Relationship status: Not on file  . Intimate partner violence:    Fear of current or ex partner: Not on file    Emotionally abused: Not on file    Physically abused: Not on file    Forced sexual activity: Not on file  Other Topics Concern  . Not on file  Social History Narrative   ** Merged  History Encounter **       Patient lives at home with daughter. Caffeine Use: 4 cups daily    Family History  Problem Relation Age of Onset  . Heart failure Mother   . Heart attack Father     Allergies  Allergen Reactions  . Ampicillin Anaphylaxis, Nausea Only and Swelling    SEVERE HEADACHE MUSCLE CRAMPS ANGIOEDEMA THRUSH PATIENT HAS HAD A PCN REACTION WITH IMMEDIATE RASH, FACIAL/TONGUE/THROAT SWELLING, SOB, OR LIGHTHEADEDNESS WITH HYPOTENSION:  #  #  YES  #  #  Has patient had a PCN reaction causing severe rash involving mucus membranes or skin necrosis: No Has patient had a PCN reaction that required hospitalization:No Has patient had a PCN reaction occurring within the last 10 years: No.  . Ampicillin-Sulbactam Sodium Anaphylaxis and Other (See Comments)    SEVERE HEADACHE MUSCLE CRAMPS ANGIOEDEMA THRUSH PATIENT HAS HAD A PCN REACTION WITH IMMEDIATE RASH, FACIAL/TONGUE/THROAT SWELLING, SOB, OR LIGHTHEADEDNESS WITH HYPOTENSION: # # YES # # Has patient had a PCN reaction causing severe rash involving mucus membranes or skin necrosis: No Has patient had a PCN reaction that required hospitalization:No Has patient had a PCN reaction occurring within the last 10 years: No   . Ambien [Zolpidem Tartrate] Nausea And Vomiting and Other (See Comments)    HALLUCINATIONS SWEATING  . Flexeril [Cyclobenzaprine] Other (See Comments)    EXTRAPYRAMIDAL MOVEMENT INVOLUNTARY MUSCLE JERKING  . Tape Itching, Rash and Other (See Comments)    Paper tape only. Adhesive tape=itching/burning/rash  . Toradol [Ketorolac Tromethamine] Nausea Only and Other (See Comments)    HEADACHE  BACKACHE  . Sulfamethoxazole-Trimethoprim     UNSPECIFIED REACTION   . Zolpidem     Hallucinations  . Cephalexin Rash and Other (See Comments)    HEADACHES  . Tramadol Rash and Other (See Comments)    HEADACHE     Medication list reviewed and updated in full in [facility].  GEN: No fevers, chills.  Nontoxic. Primarily MSK c/o today. MSK: Detailed in the HPI GI: tolerating PO intake without difficulty Neuro: No numbness, parasthesias, or tingling associated. Otherwise the pertinent positives of the ROS are noted above.   Objective:   BP 130/90   Pulse 80   Temp 97.6 F (36.4 C) (Oral)   Ht 5\' 2"  (1.575 m)   SpO2 91%   BMI 30.73 kg/m    GEN: WDWN, NAD, Non-toxic, Alert & Oriented x 3 HEENT: Atraumatic, Normocephalic.  Ears and Nose: No external deformity. EXTR: No clubbing/cyanosis/edema NEURO: SHE IS IN A WHEELCHAIR PSYCH: LABILE MOOD WITH CRYING.    She has significant scar tissue around her anterior approach hip replacement on the right side.  She points to this as a focus of pain, and I noticed this and palpate some painful scar tissue.  There is no redness or warmth. This portion of the physical examination was chaperoned by Terese Door, CMA.   She is able to flex her hip and internally and externally externally rotate the hip, but these do cause pain.  The hip appears to be located on exam.  Radiology: Dg Hip Unilat With Pelvis 2-3 Views Right  Result Date: 06/25/2018 CLINICAL DATA:  Right-sided hip pain following fall EXAM: DG HIP (WITH OR WITHOUT PELVIS) 2-3V RIGHT COMPARISON:  05/25/2018 FINDINGS: Pelvic ring is intact. Right hip replacement is again noted. Degenerative changes of lumbar spine are noted. No acute fracture is seen. IMPRESSION: No acute abnormality noted. Electronically Signed   By: Alcide Clever M.D.   On: 06/25/2018 13:16   Dg Femur, Min 2 Views Right  Result Date: 06/25/2018 CLINICAL DATA:  Right hip pain EXAM: RIGHT FEMUR 2 VIEWS COMPARISON:  09/13/2016 FINDINGS: Right hip and right knee replacements are again seen and stable. No acute fracture  is noted. No soft tissue abnormality is noted. IMPRESSION: No acute abnormality noted. Electronically Signed   By: Alcide Clever M.D.   On: 06/25/2018 13:17     Assessment and Plan:   Closed hip  fracture, sequela (Right) - Plan: DG HIP UNILAT WITH PELVIS 2-3 VIEWS RIGHT, DG FEMUR, MIN 2 VIEWS RIGHT, CANCELED: DG FEMUR, MIN 2 VIEWS RIGHT  Intertrochanteric fracture of femur, sequela (Right) - Plan: DG HIP UNILAT WITH PELVIS 2-3 VIEWS RIGHT, DG FEMUR, MIN 2 VIEWS RIGHT, CANCELED: DG FEMUR, MIN 2 VIEWS RIGHT  PE (pulmonary thromboembolism) (HCC)  Chronic myofascial pain - Plan: pregabalin (LYRICA) 100 MG capsule  Neurogenic pain - Plan: pregabalin (LYRICA) 100 MG capsule  >75 minutes spent in face to face time with patient, >50% spent in counselling or coordination of care:  Additional time spent with the patient in extensive chart review, radiographical review, operative report reviews as well as review of all specialist notes that appear to be available.  This is an exceptionally complicated orthopedic case where the patient has had multiple complications and is seen multiple surgeons.  There is no acute fracture today.  Her hip is located.  Prosthesis appears to be located.  Imaging was confirmed with musculoskeletal radiology prior to the patient leaving our office.  She is having a difficult time coping with this and is in some chronic pain.  She does have an upcoming chronic pain appointment with pain management, and she previously was seeing Dr. Laban Emperor.  In the interim, I did give her Percocet No. 40.  Also increased her Lyrica to 100 mg p.o. twice daily.  This appears to be soft tissue.  I went over in great detail with the patient and her daughter that this is really beyond the scope of my care, and I do not manage total hip replacements.  I strongly recommended that she call her total joint surgeon to report her new symptoms and she needs to have longitudinal care from a total joint specialist in my opinion.  I will cc Dr. Linna Caprice with this note and send him a copy.  Follow-up: with total joint surgeon  Meds ordered this encounter  Medications  . pregabalin (LYRICA)  100 MG capsule    Sig: Take 1 capsule (100 mg total) by mouth 2 (two) times daily.    Dispense:  60 capsule    Refill:  5    Do not place this medication, or any other prescription from our practice, on "Automatic Refill". Patient may have prescription filled one day early if pharmacy is closed on scheduled refill date.  Marland Kitchen oxyCODONE-acetaminophen (PERCOCET/ROXICET) 5-325 MG tablet    Sig: Take 1-2 tablets by mouth every 6 (six) hours as needed for severe pain.    Dispense:  40 tablet    Refill:  0   Orders Placed This Encounter  Procedures  . DG HIP UNILAT WITH PELVIS 2-3 VIEWS RIGHT  . DG FEMUR, MIN 2 VIEWS RIGHT    Signed,  Dimetrius Montfort T. Nile Dorning, MD   Outpatient Encounter Medications as of 06/25/2018  Medication Sig  . albuterol (ACCUNEB) 1.25 MG/3ML nebulizer solution Take 1 ampule by nebulization every 6 (six) hours as needed for wheezing.  Marland Kitchen albuterol (PROVENTIL HFA;VENTOLIN HFA) 108 (90 BASE) MCG/ACT inhaler Inhale 2 puffs into the lungs every 6 (six) hours as needed for wheezing or shortness of breath.   Marland Kitchen alendronate (FOSAMAX) 70 MG tablet Take 70 mg by mouth every Monday. Take with a full glass of  water on an empty stomach.  . ALPRAZolam (XANAX) 1 MG tablet Take 1 tablet (1 mg total) by mouth at bedtime as needed for sleep.  Marland Kitchen amLODipine (NORVASC) 5 MG tablet Take 5 mg by mouth daily.   Marland Kitchen apixaban (ELIQUIS) 5 MG TABS tablet Take 1 tablet (5 mg total) by mouth 2 (two) times daily. Start after 10mg  twice day dosing  . Calcium 500-100 MG-UNIT CHEW Chew 1 tablet by mouth daily.  . Cholecalciferol (VITAMIN D HIGH POTENCY PO) Take 5,000 Units by mouth daily.  . citalopram (CELEXA) 20 MG tablet Take 20 mg by mouth daily.   Marland Kitchen docusate sodium (COLACE) 100 MG capsule Take 200 mg by mouth 2 (two) times daily as needed for moderate constipation.   Clinical research associate Bandages & Supports (MEDICAL COMPRESSION SOCKS) MISC 1 each by Does not apply route daily as needed.  Marland Kitchen esomeprazole (NEXIUM) 40 MG  capsule Take 40 mg by mouth daily.   . fluticasone (FLOVENT HFA) 220 MCG/ACT inhaler Inhale 1 puff into the lungs 2 (two) times daily.  Marland Kitchen guaiFENesin (MUCINEX) 600 MG 12 hr tablet Take 600 mg by mouth 2 (two) times daily as needed for cough.  Boris Lown Oil 500 MG CAPS Take 500 mg by mouth daily.   Marland Kitchen linaclotide (LINZESS) 290 MCG CAPS capsule Take 290 mcg by mouth daily.  Marland Kitchen lovastatin (MEVACOR) 10 MG tablet Take 10 mg by mouth daily.  . Menthol, Topical Analgesic, (BIOFREEZE EX) Apply 1 application topically daily as needed (muscle pain).  . ondansetron (ZOFRAN ODT) 4 MG disintegrating tablet Take 1 tablet (4 mg total) by mouth every 8 (eight) hours as needed for nausea or vomiting.  Marland Kitchen oxyCODONE-acetaminophen (PERCOCET/ROXICET) 5-325 MG tablet Take 1-2 tablets by mouth every 6 (six) hours as needed for severe pain.  . polyethylene glycol (MIRALAX / GLYCOLAX) packet Take 17 g by mouth daily as needed (for constipation.).   Marland Kitchen pregabalin (LYRICA) 100 MG capsule Take 1 capsule (100 mg total) by mouth 2 (two) times daily.  . traZODone (DESYREL) 100 MG tablet Take 100 mg by mouth at bedtime.   . vitamin B-12 (CYANOCOBALAMIN) 500 MCG tablet Take 500 mcg by mouth daily.  . [DISCONTINUED] meloxicam (MOBIC) 15 MG tablet Take 15 mg by mouth daily.   . [DISCONTINUED] predniSONE (DELTASONE) 20 MG tablet Take 2 tablets (40 mg total) by mouth daily with breakfast. (Patient not taking: Reported on 06/12/2018)  . [DISCONTINUED] pregabalin (LYRICA) 75 MG capsule Take 1 capsule (75 mg total) by mouth 2 (two) times daily.   No facility-administered encounter medications on file as of 06/25/2018.

## 2018-06-29 ENCOUNTER — Inpatient Hospital Stay: Payer: Medicare Other | Admitting: Oncology

## 2018-07-02 IMAGING — DX DG KNEE COMPLETE 4+V*R*
4 series · 5 of 5 positions shown · non-contrast
Comparison: 09/13/2016

CLINICAL DATA: 76-year-old female with a history of right knee
pain. Prior fracture

EXAM:
RIGHT KNEE - COMPLETE 4+ VIEW

[Series 1: knee ap · 0.14mm/px · 2 of 2 slices shown]
[im 1/2]
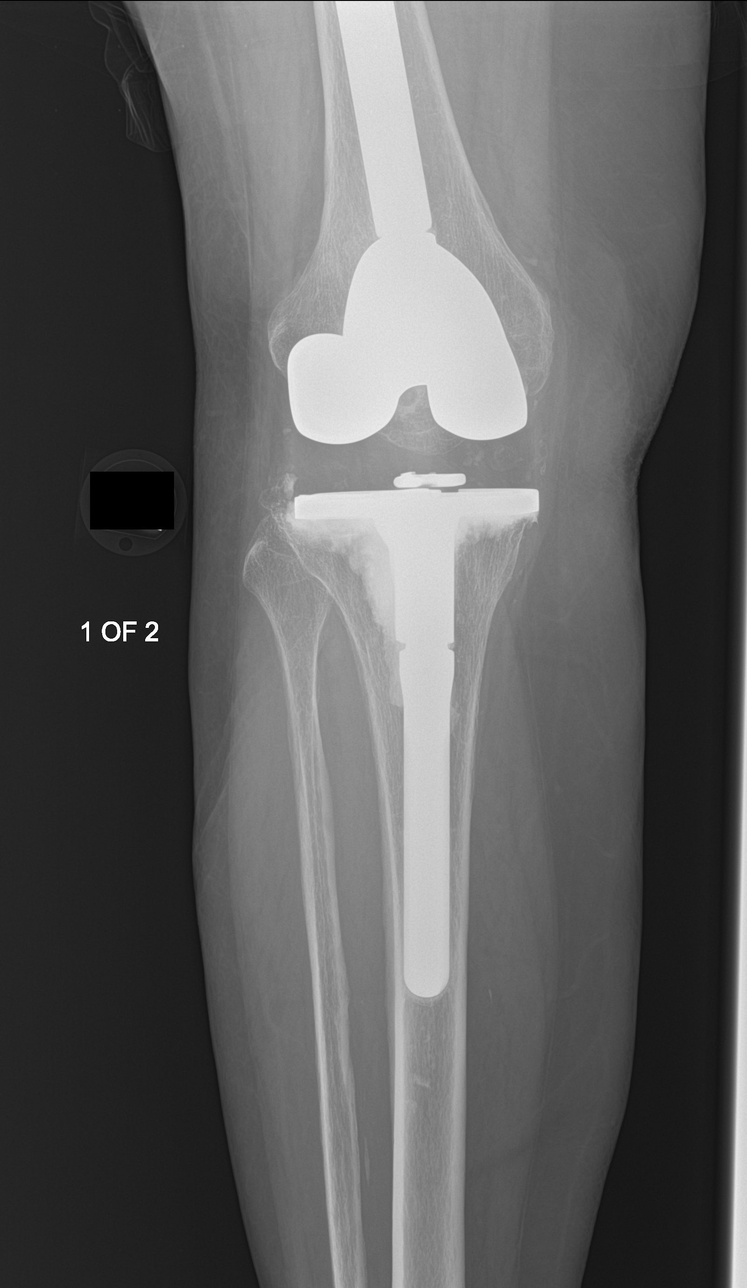
[im 2/2]
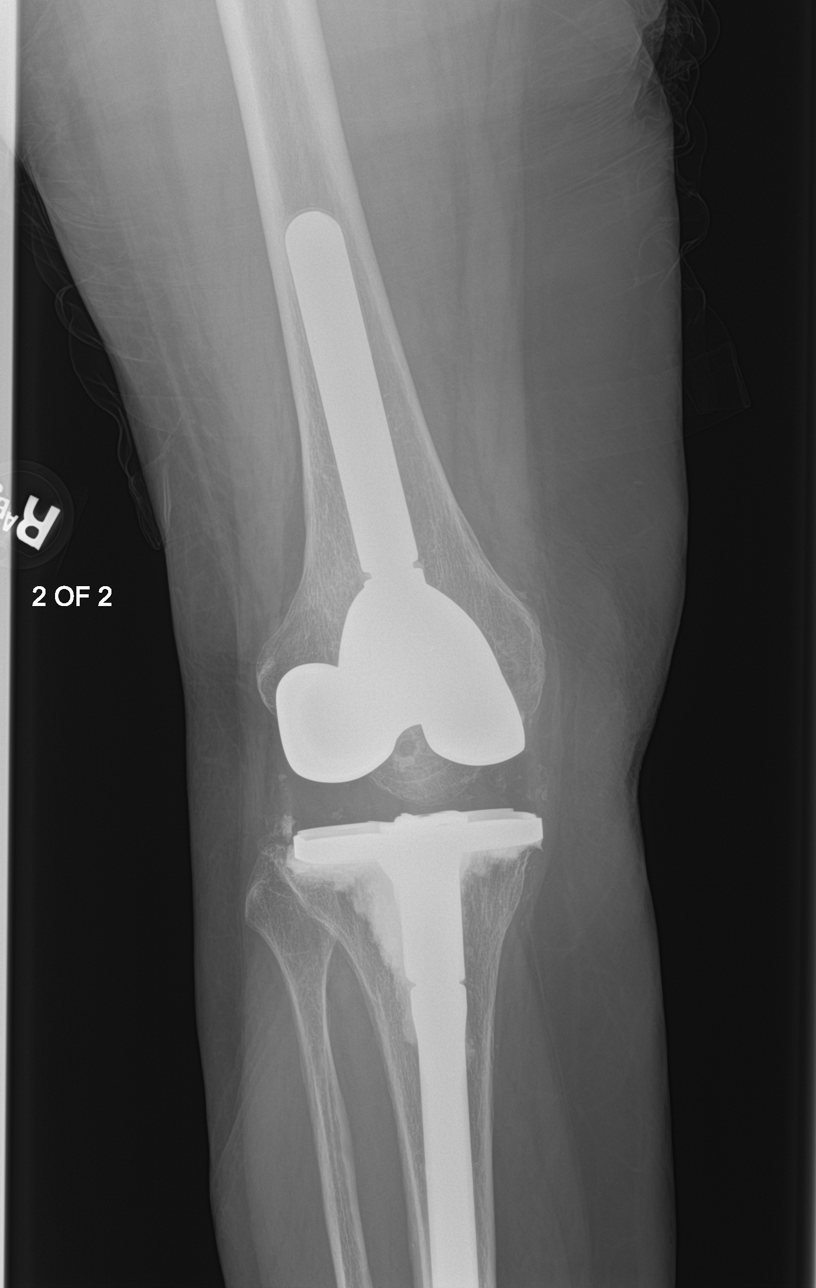

[knee lat]
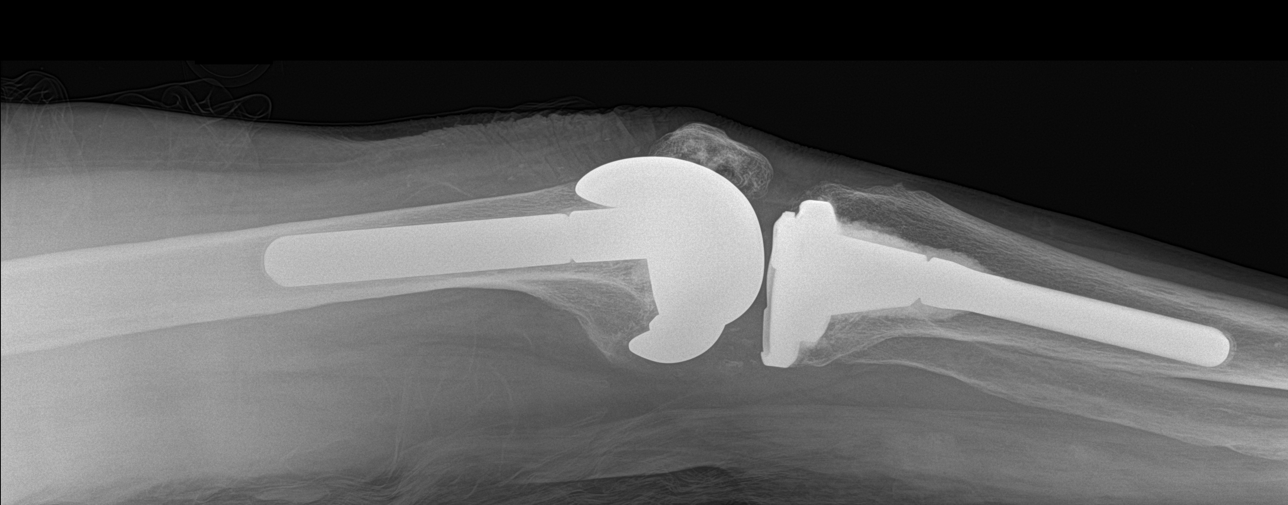

[knee obl (1 of 2)]
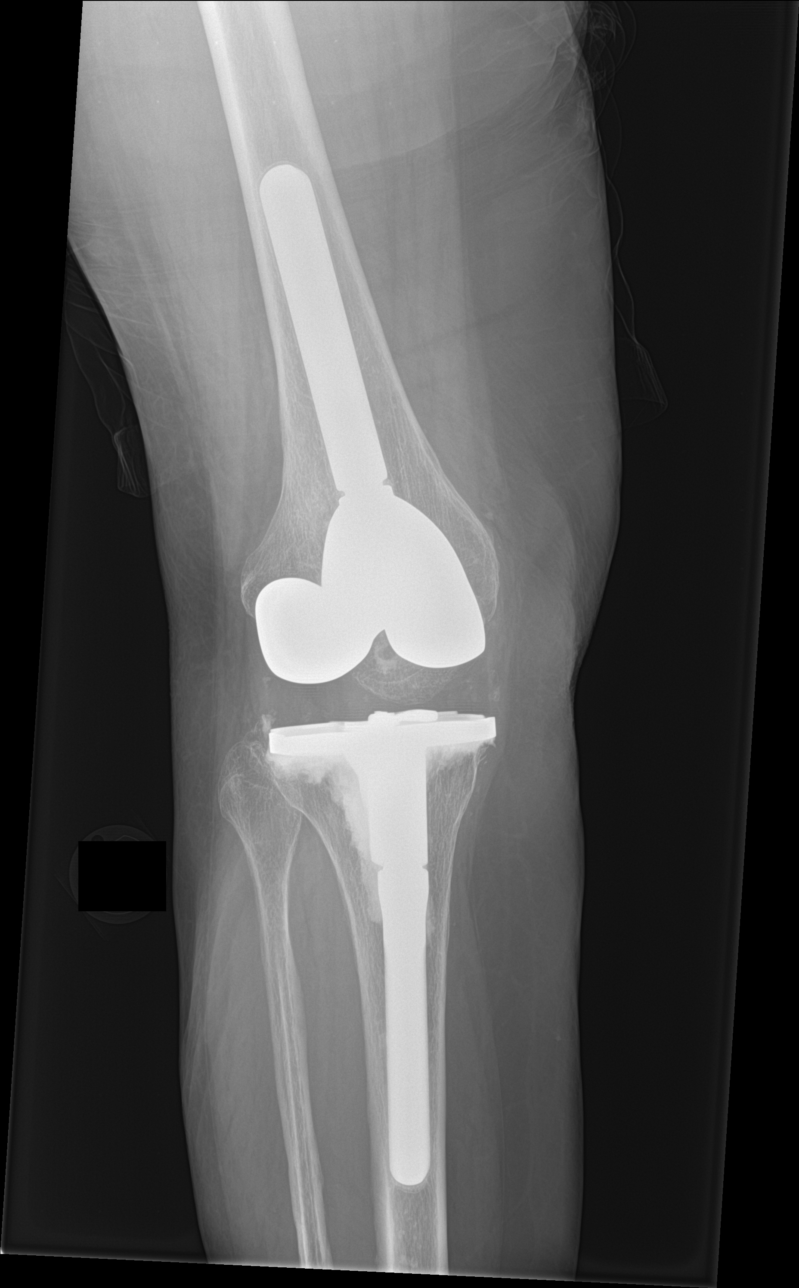

[knee obl (2 of 2)]
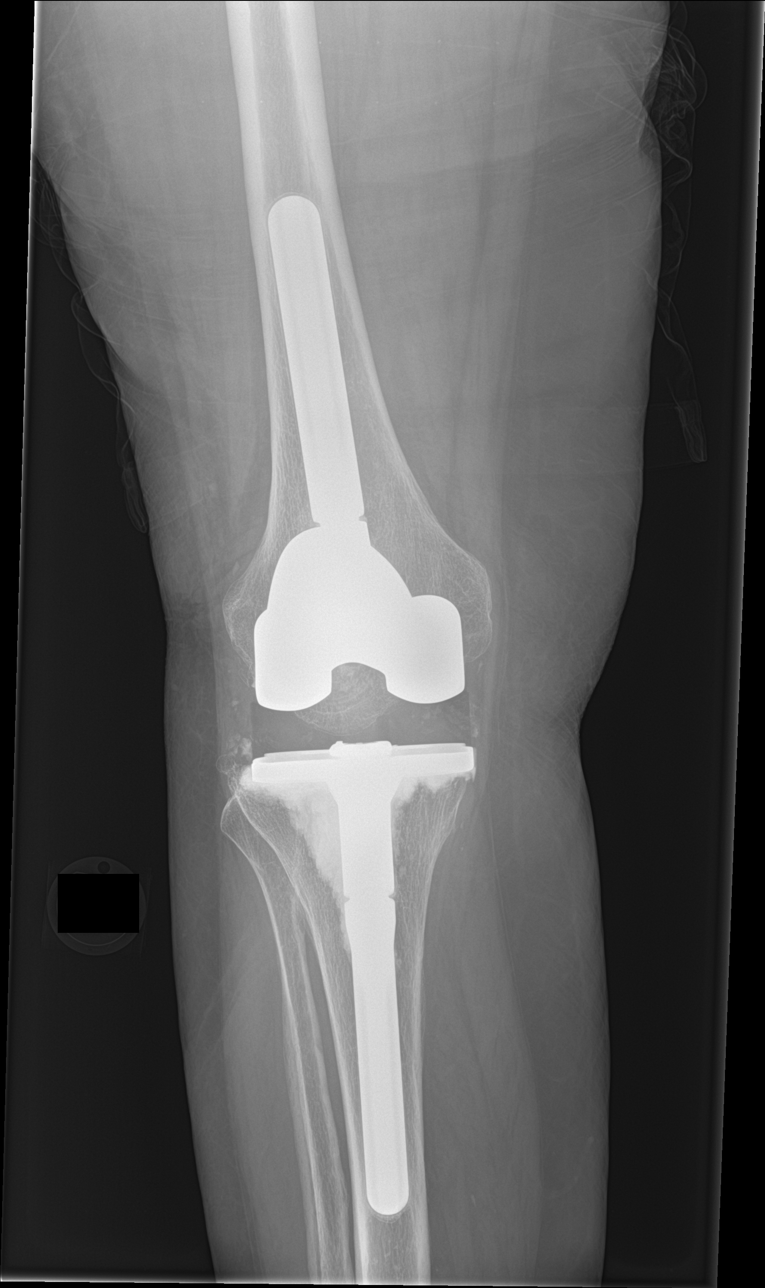

[5 of 5 positions shown; findings below may reference images not displayed]

FINDINGS: Surgical changes of right knee arthroplasty. No evidence of
perihardware fracture. No joint effusion. No focal soft tissue
swelling. No displaced fracture of the visualized femur or proximal
lower extremity.
IMPRESSION: No acute bony abnormality.

Surgical changes of prior knee arthroplasty.

## 2018-07-03 ENCOUNTER — Encounter: Payer: Self-pay | Admitting: Oncology

## 2018-07-03 ENCOUNTER — Other Ambulatory Visit: Payer: Self-pay

## 2018-07-03 ENCOUNTER — Inpatient Hospital Stay: Payer: Medicare Other

## 2018-07-03 ENCOUNTER — Inpatient Hospital Stay: Payer: Medicare Other | Attending: Oncology | Admitting: Oncology

## 2018-07-03 VITALS — BP 114/78 | HR 101 | Temp 96.3°F | Resp 18 | Ht 62.0 in | Wt 166.5 lb

## 2018-07-03 DIAGNOSIS — Z79899 Other long term (current) drug therapy: Secondary | ICD-10-CM

## 2018-07-03 DIAGNOSIS — I2699 Other pulmonary embolism without acute cor pulmonale: Secondary | ICD-10-CM | POA: Diagnosis present

## 2018-07-03 DIAGNOSIS — Z7901 Long term (current) use of anticoagulants: Secondary | ICD-10-CM | POA: Diagnosis not present

## 2018-07-03 DIAGNOSIS — I82A23 Chronic embolism and thrombosis of axillary vein, bilateral: Secondary | ICD-10-CM | POA: Diagnosis not present

## 2018-07-03 DIAGNOSIS — Z8249 Family history of ischemic heart disease and other diseases of the circulatory system: Secondary | ICD-10-CM

## 2018-07-03 LAB — CBC WITH DIFFERENTIAL/PLATELET
Abs Immature Granulocytes: 0.01 10*3/uL (ref 0.00–0.07)
Basophils Absolute: 0.1 10*3/uL (ref 0.0–0.1)
Basophils Relative: 1 %
Eosinophils Absolute: 0.1 10*3/uL (ref 0.0–0.5)
Eosinophils Relative: 2 %
HCT: 42.8 % (ref 36.0–46.0)
Hemoglobin: 13.1 g/dL (ref 12.0–15.0)
Immature Granulocytes: 0 %
Lymphocytes Relative: 28 %
Lymphs Abs: 1.7 10*3/uL (ref 0.7–4.0)
MCH: 29.2 pg (ref 26.0–34.0)
MCHC: 30.6 g/dL (ref 30.0–36.0)
MCV: 95.3 fL (ref 80.0–100.0)
Monocytes Absolute: 0.6 10*3/uL (ref 0.1–1.0)
Monocytes Relative: 11 %
Neutro Abs: 3.6 10*3/uL (ref 1.7–7.7)
Neutrophils Relative %: 58 %
Platelets: 203 10*3/uL (ref 150–400)
RBC: 4.49 MIL/uL (ref 3.87–5.11)
RDW: 14.9 % (ref 11.5–15.5)
WBC: 6.1 10*3/uL (ref 4.0–10.5)
nRBC: 0 % (ref 0.0–0.2)

## 2018-07-03 NOTE — Progress Notes (Signed)
Patient here for follow up. Pt has been having tenderness to right lower extremity and is concerned that it is a blood clot. Pt currently on eliquis 5mg  BID.

## 2018-07-04 NOTE — Progress Notes (Signed)
Hematology/Oncology Consult note [facility] [phone] [phone]   Patient Care Team: [name], FNP as PCP - General (Nurse Practitioner)  REFERRING PROVIDER: [name], FNP  CHIEF COMPLAINTS/REASON FOR VISIT:  Evaluation of pulmonary embolism.   HISTORY OF PRESENTING ILLNESS:  [name] is a  78 y.o.  female with PMH listed below who was referred to me for evaluation of pulmonary embolism.  PE was diagnosed 04/30/2018, CT chest angiogram showed pulmonary embolism with right heart strain..  05/02/2018 US duplex lower extremities showed  Right: age indeterminate deep vein thrombosis involving the right posterior tibial vein, and right peroneal vein.  Left: acute deep vein thrombosis involving the left posterior tibial vein, and left peroneal vein.  She is on Eliquis 5mg  BID currently.  PE and DVT events occurred shortly after her hospitalization due to pneumonia.  Patient has chronic ambulation difficulty after her right hip arthroplasty  in May 2019. She subsequently had several complications immediately postoperative of fecal impaction and ileus.  This was done after an intertrochanteric hip fracture on the right side.She has had multiple ER visits over the past several months.  Also complains chronic lower extremities "soreness" for several month.   Denies any previous history of other thrombosis events. Deneis dami Per daughter, patient's son also developed "leg clot".   Review of Systems  Constitutional: Positive for fatigue. Negative for appetite change, chills and fever.  HENT:   Negative for hearing loss and voice change.   Eyes: Negative for eye problems.  Respiratory: Negative for chest tightness and cough.   Cardiovascular: Negative for chest pain.  Gastrointestinal: Negative for abdominal distention, abdominal pain and blood in stool.  Endocrine: Negative for hot flashes.  Genitourinary: Negative for  difficulty urinating and frequency.   Musculoskeletal: Positive for arthralgias.       Bilateral lower extremities soreness  Skin: Negative for itching and rash.  Neurological: Negative for extremity weakness.  Hematological: Negative for adenopathy.  Psychiatric/Behavioral: Negative for confusion.    MEDICAL HISTORY:  Past Medical History:  Diagnosis Date  . Acute postoperative pain 02/03/2018  . Anemia   . B12 deficiency   . Bacteremia 10/07/2014  . Bladder incontinence   . Blind left eye   . Cancer ([place])    skin cancer   . Cataract   . Cervical spine fracture ([place])   . Chest pain 07/29/2013  . Compression fracture    C7,  upper T spine -compression fx  . COPD (chronic obstructive pulmonary disease) ([place])   . DDD (degenerative disc disease), lumbar   . Depression    major  . Diffuse myofascial pain syndrome 03/24/2015  . Dyspnea    at times- when activty  . Fever 10/05/2014  . GERD (gastroesophageal reflux disease)   . Hiatal hernia   . Hyperlipidemia   . Hypertension   . 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 Macular degeneration   . On home oxygen therapy    "3L; all the time" (04/30/2018)  . Osteoarthritis   . Osteoporosis   . Pneumonia    years ago  . Pulmonary embolism ([place]) 04/30/2018  . Reactive airway disease   . Rupture of bowel ([place])   . Sepsis ([place]) 10/08/2014  . Stroke [facility])    "mini stroke"- balance and memory issue  . Stroke ([place])   . Wrist pain, acute 09/10/2012    SURGICAL HISTORY: Past Surgical History:  Procedure Laterality Date  . ABDOMINAL HYSTERECTOMY    .  ABDOMINAL SURGERY    . APPENDECTOMY    . BLADDER SURGERY    . CATARACT EXTRACTION W/ INTRAOCULAR LENS  IMPLANT, BILATERAL Bilateral   . CHOLECYSTECTOMY    . COLON SURGERY    . COLOSTOMY REVERSAL    . INTRAMEDULLARY (IM) NAIL INTERTROCHANTERIC Right 09/13/2016   Procedure: INTRAMEDULLARY (IM) NAIL INTERTROCHANTRIC RIGHT;  Surgeon: Leandrew Koyanagi, MD;  Location: [place];   Service: Orthopedics;  Laterality: Right;  . JOINT REPLACEMENT Bilateral   . KNEE SURGERY    . OSTOMY    . RECTOCELE REPAIR    . TOE SURGERY Right    3rd  . TOTAL HIP ARTHROPLASTY Right 09/30/2017   Procedure: RIGHT HIP IM NAIL REMOVAL WITH CONVERSION TO TOTAL HIP ARTHOPLASTY, anterior approach;  Surgeon: Renette Butters, MD;  Location: [place];  Service: Orthopedics;  Laterality: Right;    SOCIAL HISTORY: Social History   Socioeconomic History  . Marital status: Widowed    Spouse name: Not on file  . Number of children: 3  . Years of education: middle sch  . Highest education level: Not on file  Occupational History  . Occupation: retired    Comment: n/a  Social Needs  . Financial resource strain: Not on file  . Food insecurity:    Worry: Not on file    Inability: Not on file  . Transportation needs:    Medical: Not on file    Non-medical: Not on file  Tobacco Use  . Smoking status: Former Smoker    Packs/day: 1.50    Years: 30.00    Pack years: 45.00    Types: E-cigarettes, Cigarettes    Last attempt to quit: 2013    Years since quitting: 7.1  . Smokeless tobacco: Never Used  Substance and Sexual Activity  . Alcohol use: No    Alcohol/week: 0.0 standard drinks  . Drug use: No  . Sexual activity: Not Currently  Lifestyle  . Physical activity:    Days per week: Not on file    Minutes per session: Not on file  . Stress: Not on file  Relationships  . Social connections:    Talks on phone: Not on file    Gets together: Not on file    Attends religious service: Not on file    Active member of club or organization: Not on file    Attends meetings of clubs or organizations: Not on file    Relationship status: Not on file  . Intimate partner violence:    Fear of current or ex partner: Not on file    Emotionally abused: Not on file    Physically abused: Not on file    Forced sexual activity: Not on file  Other Topics Concern  . Not on file  Social History  Narrative   ** Merged History Encounter **       Patient lives at home with daughter. Caffeine Use: 4 cups daily    FAMILY HISTORY: Family History  Problem Relation Age of Onset  . Heart failure Mother   . Heart attack Father   . Lung cancer Sister   . Deep vein thrombosis Sister   . Pulmonary embolism Son   . Deep vein thrombosis Son   . Cervical cancer Daughter     ALLERGIES:  is allergic to ampicillin; ampicillin-sulbactam sodium; ambien [zolpidem tartrate]; flexeril [cyclobenzaprine]; tape; toradol [ketorolac tromethamine]; sulfamethoxazole-trimethoprim; zolpidem; cephalexin; and tramadol.  MEDICATIONS:  Current Outpatient Medications  Medication Sig Dispense Refill  .  albuterol (ACCUNEB) 1.25 MG/3ML nebulizer solution Take 1 ampule by nebulization every 6 (six) hours as needed for wheezing.    Marland Kitchen albuterol (PROVENTIL HFA;VENTOLIN HFA) 108 (90 BASE) MCG/ACT inhaler Inhale 2 puffs into the lungs every 6 (six) hours as needed for wheezing or shortness of breath.     Marland Kitchen alendronate (FOSAMAX) 70 MG tablet Take 70 mg by mouth every Monday. Take with a full glass of water on an empty stomach.    . ALPRAZolam (XANAX) 1 MG tablet Take 1 tablet (1 mg total) by mouth at bedtime as needed for sleep. 30 tablet 1  . amLODipine (NORVASC) 5 MG tablet Take 5 mg by mouth daily.     . Calcium 500-100 MG-UNIT CHEW Chew 1 tablet by mouth daily.    . citalopram (CELEXA) 20 MG tablet Take 20 mg by mouth daily.     Marland Kitchen docusate sodium (COLACE) 100 MG capsule Take 200 mg by mouth 2 (two) times daily as needed for moderate constipation.     Water engineer Bandages & Supports (MEDICAL COMPRESSION SOCKS) MISC 1 each by Does not apply route daily as needed. 2 each 0  . esomeprazole (NEXIUM) 40 MG capsule Take 40 mg by mouth daily.     Marland Kitchen linaclotide (LINZESS) 290 MCG CAPS capsule Take 290 mcg by mouth daily.    Marland Kitchen lovastatin (MEVACOR) 10 MG tablet Take 10 mg by mouth daily.    . Menthol, Topical Analgesic,  (BIOFREEZE EX) Apply 1 application topically daily as needed (muscle pain).    . ondansetron (ZOFRAN ODT) 4 MG disintegrating tablet Take 1 tablet (4 mg total) by mouth every 8 (eight) hours as needed for nausea or vomiting. 30 tablet 0  . oxyCODONE-acetaminophen (PERCOCET/ROXICET) 5-325 MG tablet Take 1-2 tablets by mouth every 6 (six) hours as needed for severe pain. 40 tablet 0  . polyethylene glycol (MIRALAX / GLYCOLAX) packet Take 17 g by mouth daily as needed (for constipation.).     Marland Kitchen pregabalin (LYRICA) 100 MG capsule Take 1 capsule (100 mg total) by mouth 2 (two) times daily. 60 capsule 5  . apixaban (ELIQUIS) 5 MG TABS tablet Take 1 tablet (5 mg total) by mouth 2 (two) times daily. Start after 10mg  twice day dosing 60 tablet 0  . Cholecalciferol (VITAMIN D HIGH POTENCY PO) Take 5,000 Units by mouth daily.    . fluticasone (FLOVENT HFA) 220 MCG/ACT inhaler Inhale 1 puff into the lungs 2 (two) times daily.    Marland Kitchen guaiFENesin (MUCINEX) 600 MG 12 hr tablet Take 600 mg by mouth 2 (two) times daily as needed for cough.    Javier Docker Oil 500 MG CAPS Take 500 mg by mouth daily.     . traZODone (DESYREL) 100 MG tablet Take 100 mg by mouth at bedtime.     . vitamin B-12 (CYANOCOBALAMIN) 500 MCG tablet Take 500 mcg by mouth daily.     No current facility-administered medications for this visit.      PHYSICAL EXAMINATION: ECOG PERFORMANCE STATUS: 3 - Symptomatic, >50% confined to bed Vitals:   07/03/18 1101  BP: 114/78  Pulse: (!) 101  Resp: 18  Temp: (!) 96.3 F (35.7 C)  SpO2: 93%   Filed Weights   07/03/18 1101  Weight: 166 lb 8 oz (75.5 kg)    Physical Exam Constitutional:      General: She is not in acute distress.    Appearance: She is ill-appearing.     Comments: Sits in wheelchair  Eyes:     General: No scleral icterus.    Pupils: Pupils are equal, round, and reactive to light.  Neck:     Musculoskeletal: Normal range of motion and neck supple.  Cardiovascular:     Rate  and Rhythm: Normal rate and regular rhythm.     Heart sounds: Normal heart sounds.  Pulmonary:     Effort: Pulmonary effort is normal. No respiratory distress.     Breath sounds: No wheezing.  Abdominal:     General: Bowel sounds are normal. There is no distension.     Palpations: Abdomen is soft. There is no mass.     Tenderness: There is no abdominal tenderness.  Musculoskeletal: Normal range of motion.        General: Swelling present. No deformity.  Skin:    General: Skin is warm and dry.     Findings: No erythema or rash.  Neurological:     Mental Status: She is alert and oriented to person, place, and time.     Cranial Nerves: No cranial nerve deficit.     Coordination: Coordination normal.      LABORATORY DATA:  I have reviewed the data as listed Lab Results  Component Value Date   WBC 6.1 07/03/2018   HGB 13.1 07/03/2018   HCT 42.8 07/03/2018   MCV 95.3 07/03/2018   PLT 203 07/03/2018   Recent Labs    04/30/18 0920 05/01/18 0723 05/04/18 0302 05/13/18 1141 05/17/18 1126  NA 140 143 141 140 139  K 3.4* 4.3 4.1 4.2 3.6  CL 108 109 103 104 106  CO2 25 26 31 31 24   GLUCOSE 87 89 82 73 65*  BUN 11 9 7* 13 7*  CREATININE 1.06* 0.93 0.95 1.15 0.94  CALCIUM 9.3 9.0 9.1 9.8 8.9  GFRNONAA 51* 60* 58*  --  59*  GFRAA 59* >60 >60  --  >60  PROT 6.4*  --   --  7.3 7.2  ALBUMIN 2.2*  --   --  3.1* 3.0*  AST 27  --   --  15 26  ALT 25  --   --  11 12  ALKPHOS 51  --   --  46 51  BILITOT 0.5  --   --  0.2 0.6   Iron/TIBC/Ferritin/ %Sat No results found for: IRON, TIBC, FERRITIN, IRONPCTSAT    RADIOGRAPHIC STUDIES: I have personally reviewed the radiological images as listed and agreed with the findings in the report. 04/30/2018 CT chest Angiogram  1. Positive for acute PE with CT evidence of right heart strain (RV/LV Ratio = 1.15) consistent with at least submassive (intermediate risk) PE. The presence of right heart strain has been associated with an increased  risk of morbidity and mortality. 2. Extensive bilateral patchy areas of ground-glass attenuation and airspace consolidation which may represent areas of alveolar edema, hemorrhage and/or infection. 3.  Aortic Atherosclerosis (ICD10-I70.0). 127/2020 VAS [place] lower extremities.  Right: Findings consistent with age indeterminate deep vein thrombosis involving the right posterior tibial vein, and right peroneal vein. No cystic structure found in the popliteal fossa. Left: Findings consistent with acute deep vein thrombosis involving the left posterior tibial vein, and left peroneal vein. No cystic structure found in the popliteal fossa. ASSESSMENT & PLAN:  1. PE (pulmonary thromboembolism) ([place])   2. 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 Family history of DVT    Image was independently reviewed and discussed with patient and her daughter.  Recommend full strength anticoagulation with Elqiuis 5mg  BID for 6 months and recommend to continue with low dose for maintenance as she has high risk of thrombosis recurrence given imobilization.  Bilateral lower extremities soreness is likely due to chronic vein insufficiency. Recommend compression stocking.  Family history of DVT. Check factor 5 leiden mutation and prothrombin gene mutation.  Orders Placed This Encounter  Procedures  . CBC with Differential/Platelet    Standing Status:   Future    Number of Occurrences:   1    Standing Expiration Date:   07/04/2019  . Factor 5 leiden    Standing Status:   Future    Number of Occurrences:   1    Standing Expiration Date:   07/04/2019  . Prothrombin gene mutation    Standing Status:   Future    Number of Occurrences:   1    Standing Expiration Date:   07/04/2019  . CBC with Differential/Platelet    Standing Status:   Future    Standing Expiration Date:   07/04/2019  . Comprehensive metabolic panel    Standing Status:   Future    Standing Expiration Date:   07/04/2019    All questions were  answered. The patient knows to call the clinic with any problems questions or concerns.  Return of visit: 2-3 months.  Thank you for this kind referral and the opportunity to participate in the care of this patient. A copy of today's note is routed to referring provider  Total face to face encounter time for this patient visit was 45 min. >50% of the time was  spent in counseling and coordination of care.    Earlie Server, MD, PhD Hematology Oncology [facility] Pager- [phone] 07/04/2018

## 2018-07-06 ENCOUNTER — Encounter: Payer: Self-pay | Admitting: Family Medicine

## 2018-07-06 ENCOUNTER — Ambulatory Visit (INDEPENDENT_AMBULATORY_CARE_PROVIDER_SITE_OTHER): Payer: Medicare Other | Admitting: Family Medicine

## 2018-07-06 VITALS — BP 118/90 | HR 104 | Temp 97.6°F | Resp 16 | Ht 62.0 in

## 2018-07-06 DIAGNOSIS — R05 Cough: Secondary | ICD-10-CM | POA: Diagnosis not present

## 2018-07-06 DIAGNOSIS — G8929 Other chronic pain: Secondary | ICD-10-CM | POA: Diagnosis not present

## 2018-07-06 DIAGNOSIS — R0982 Postnasal drip: Secondary | ICD-10-CM

## 2018-07-06 DIAGNOSIS — M25551 Pain in right hip: Secondary | ICD-10-CM

## 2018-07-06 DIAGNOSIS — R059 Cough, unspecified: Secondary | ICD-10-CM

## 2018-07-06 MED ORDER — OXYCODONE-ACETAMINOPHEN 5-325 MG PO TABS: 1.0000 | ORAL_TABLET | Freq: Four times a day (QID) | ORAL | 0 refills | Status: DC | PRN

## 2018-07-06 NOTE — Patient Instructions (Signed)
I think cough is primarily due to post nasal drainage, please continue plain mucinex, add saline nasal spray 3-5 times a day, notify office if fever, increased phlegm production  Please see Rosaria Ferries in referrals for appointment with Dr. Sharlet Salina

## 2018-07-06 NOTE — Progress Notes (Signed)
Subjective:    Patient ID: [name], female    DOB: 09-Mar-1941, 78 y.o.   MRN: [id number]  HPI This is a 78 yo female, accompanied by her daughter, who presents today with cough of thick phlegm. Wakes up with thick phlegm, creamy colored. Frequently clears throat. Less cough throughout the day. No SOB or wheeze. No fever. Daughter gave her one dose of guaifenesin yesterday.   Saw Dr. Percell Miller today. He gave her a shot in her left hip today. Very painful. They have put in an order for pain management at [facility]. She continues to have severe pain in hip and is unable to ambulate. Her daughter rolls her around on her walker. She is seeing Dr. Ronnie Derby for her knee later this week. She is currently on Eliquis for DVT/PE, can not have NSAIDs, no relief with Tramadol. She is on Lyrica. She has been given ocycodone-acetaminophen 5-325 twice from this office (once by me and once by Dr. Lorelei Pont). The daughter reports that this "took the edge off." According to her daughter, the patient spends her days crying in pain. [name] states that she doesn't want to go on like this.    She met with hematology who is keeping her on Eliquis and rechecking her in 3 months.   Past Medical History:  Diagnosis Date  . Acute postoperative pain 02/03/2018  . Anemia   . B12 deficiency   . Bacteremia 10/07/2014  . Bladder incontinence   . Blind left eye   . Cancer ([place])    skin cancer   . Cataract   . Cervical spine fracture ([place])   . Chest pain 07/29/2013  . Compression fracture    C7,  upper T spine -compression fx  . COPD (chronic obstructive pulmonary disease) ([place])   . DDD (degenerative disc disease), lumbar   . Depression    major  . Diffuse myofascial pain syndrome 03/24/2015  . Dyspnea    at times- when activty  . Fever 10/05/2014  . GERD (gastroesophageal reflux disease)   . Hiatal hernia   . Hyperlipidemia   . Hypertension   . 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 Macular  degeneration   . On home oxygen therapy    "3L; all the time" (04/30/2018)  . Osteoarthritis   . Osteoporosis   . Pneumonia    years ago  . Pulmonary embolism ([place]) 04/30/2018  . Reactive airway disease   . Rupture of bowel ([place])   . Sepsis ([place]) 10/08/2014  . Stroke [facility])    "mini stroke"- balance and memory issue  . Stroke ([place])   . Wrist pain, acute 09/10/2012   Past Surgical History:  Procedure Laterality Date  . ABDOMINAL HYSTERECTOMY    . ABDOMINAL SURGERY    . APPENDECTOMY    . BLADDER SURGERY    . CATARACT EXTRACTION W/ INTRAOCULAR LENS  IMPLANT, BILATERAL Bilateral   . CHOLECYSTECTOMY    . COLON SURGERY    . COLOSTOMY REVERSAL    . INTRAMEDULLARY (IM) NAIL INTERTROCHANTERIC Right 09/13/2016   Procedure: INTRAMEDULLARY (IM) NAIL INTERTROCHANTRIC RIGHT;  Surgeon: Leandrew Koyanagi, MD;  Location: [place];  Service: Orthopedics;  Laterality: Right;  . JOINT REPLACEMENT Bilateral   . KNEE SURGERY    . OSTOMY    . RECTOCELE REPAIR    . TOE SURGERY Right    3rd  . TOTAL HIP ARTHROPLASTY Right 09/30/2017   Procedure: RIGHT HIP IM NAIL REMOVAL WITH CONVERSION TO TOTAL HIP  ARTHOPLASTY, anterior approach;  Surgeon: Renette Butters, MD;  Location: [place];  Service: Orthopedics;  Laterality: Right;   Family History  Problem Relation Age of Onset  . Heart failure Mother   . Heart attack Father   . Lung cancer Sister   . Deep vein thrombosis Sister   . Pulmonary embolism Son   . Deep vein thrombosis Son   . Cervical cancer Daughter    Social History   Tobacco Use  . Smoking status: Former Smoker    Packs/day: 1.50    Years: 30.00    Pack years: 45.00    Types: E-cigarettes, Cigarettes    Last attempt to quit: 2013    Years since quitting: 7.1  . Smokeless tobacco: Never Used  Substance Use Topics  . Alcohol use: No    Alcohol/week: 0.0 standard drinks  . Drug use: No      Review of Systems Per HPI    Objective:   Physical Exam Vitals signs reviewed.    Constitutional:      General: She is not in acute distress.    Appearance: She is obese. She is ill-appearing. She is not toxic-appearing or diaphoretic.     Comments: Frail, chronically appearing female in a wheelchair.   HENT:     Head: Normocephalic and atraumatic.     Nose: Nose normal.     Mouth/Throat:     Mouth: Mucous membranes are moist.     Pharynx: Oropharynx is clear.  Eyes:     Conjunctiva/sclera: Conjunctivae normal.  Cardiovascular:     Rate and Rhythm: Normal rate.  Pulmonary:     Effort: Pulmonary effort is normal. No respiratory distress.     Breath sounds: No wheezing, rhonchi or rales.     Comments: Mildly decreased in bases.  Neurological:     Mental Status: She is alert and oriented to person, place, and time.  Psychiatric:     Comments: She has tearless crying throughout most of the visit.        BP 118/90 (BP Location: Right Arm, Patient Position: Sitting, Cuff Size: Normal)   Pulse (!) 104   Temp 97.6 F (36.4 C) (Oral)   Resp 16   Ht _0  (1.575 m)   SpO2 97%   BMI 30.45 kg/m   BP Readings from Last 3 Encounters:  07/06/18 118/90  07/03/18 114/78  06/25/18 130/90   Wt Readings from Last 3 Encounters:  07/03/18 166 lb 8 oz (75.5 kg)  06/12/18 168 lb (76.2 kg)  05/25/18 166 lb 9.6 oz (75.6 kg)        Assessment & Plan:  1. Chronic hip pain (Right) - Unfortunately, there is little I can offer the patient. I will provide her some pain medication until she can get in with pain management, I have discussed potential side effects including drowsiness and increase risk of falls - I am referring her to physiatry in the hope that they can provide guidance on ambulation/physical therapy/rehabilitation.  - Ambulatory referral to Orthopedics - oxyCODONE-acetaminophen (PERCOCET/ROXICET) 5-325 MG tablet; Take 1-2 tablets by mouth every 6 (six) hours as needed for severe pain.  Dispense: 40 tablet; Refill: 0  2. Cough - lungs at baseline, seems to  be related to post nasal drainage, continue guaifenesin and add saline nasal spray, increase fluids, continue fluticasone nasal spray  3. Post-nasal drainage - see #2   Clarene Reamer, FNP-BC  [facility] [place]  07/07/2018 7:44 AM

## 2018-07-07 ENCOUNTER — Encounter: Payer: Self-pay | Admitting: Family Medicine

## 2018-07-07 NOTE — Progress Notes (Signed)
This patient continues to request appointments to come in for evaluation and then she does not keep the appointment.  In fact the patient did not keep the original postprocedure appointment after her 04/07/2018 procedure.

## 2018-07-08 ENCOUNTER — Ambulatory Visit (HOSPITAL_BASED_OUTPATIENT_CLINIC_OR_DEPARTMENT_OTHER): Payer: Medicare Other | Admitting: Pain Medicine

## 2018-07-08 DIAGNOSIS — M25561 Pain in right knee: Secondary | ICD-10-CM

## 2018-07-08 DIAGNOSIS — G8929 Other chronic pain: Secondary | ICD-10-CM

## 2018-07-08 DIAGNOSIS — G894 Chronic pain syndrome: Secondary | ICD-10-CM

## 2018-07-08 DIAGNOSIS — M545 Low back pain: Secondary | ICD-10-CM

## 2018-07-08 DIAGNOSIS — M542 Cervicalgia: Secondary | ICD-10-CM

## 2018-07-08 LAB — FACTOR 5 LEIDEN

## 2018-07-09 LAB — PROTHROMBIN GENE MUTATION

## 2018-07-17 ENCOUNTER — Ambulatory Visit (INDEPENDENT_AMBULATORY_CARE_PROVIDER_SITE_OTHER): Payer: Medicare Other | Admitting: Family Medicine

## 2018-07-17 ENCOUNTER — Encounter: Payer: Self-pay | Admitting: Family Medicine

## 2018-07-17 VITALS — BP 140/84 | HR 68 | Temp 97.6°F | Wt 169.0 lb

## 2018-07-17 DIAGNOSIS — S81802A Unspecified open wound, left lower leg, initial encounter: Secondary | ICD-10-CM

## 2018-07-17 NOTE — Patient Instructions (Signed)
Good to see you today  Continue to keep area clean and dry and leave open to air at least a couple of hours a day  Send me a picture if you are concerned about infection

## 2018-07-17 NOTE — Progress Notes (Signed)
Subjective:    Patient ID: [name], female    DOB: [birth year], 78 y.o.   MRN: [id number]  HPI This is a 78 yo female, accompanied by her daughter, who presents today with left leg redness and weeping following getting scratched by her dog several weeks ago. She has been applying bandages, neosporin and cleaning with sterile saline. No fever/chills, no streaking.  She has been increasing standing and tolerating on right hip. She is scheduled to start PT soon.  Past Medical History:  Diagnosis Date  . Acute postoperative pain 02/03/2018  . Anemia   . B12 deficiency   . Bacteremia 10/07/2014  . Bladder incontinence   . Blind left eye   . Cancer ([place])    skin cancer   . Cataract   . Cervical spine fracture ([place])   . Chest pain 07/29/2013  . Compression fracture    C7,  upper T spine -compression fx  . COPD (chronic obstructive pulmonary disease) ([place])   . DDD (degenerative disc disease), lumbar   . Depression    major  . Diffuse myofascial pain syndrome 03/24/2015  . Dyspnea    at times- when activty  . Fever 10/05/2014  . GERD (gastroesophageal reflux disease)   . Hiatal hernia   . Hyperlipidemia   . Hypertension   . 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 Macular degeneration   . On home oxygen therapy    "3L; all the time" (04/30/2018)  . Osteoarthritis   . Osteoporosis   . Pneumonia    years ago  . Pulmonary embolism ([place]) 04/30/2018  . Reactive airway disease   . Rupture of bowel ([place])   . Sepsis ([place]) 10/08/2014  . Stroke [facility])    "mini stroke"- balance and memory issue  . Stroke ([place])   . Wrist pain, acute 09/10/2012   Past Surgical History:  Procedure Laterality Date  . ABDOMINAL HYSTERECTOMY    . ABDOMINAL SURGERY    . APPENDECTOMY    . BLADDER SURGERY    . CATARACT EXTRACTION W/ INTRAOCULAR LENS  IMPLANT, BILATERAL Bilateral   . CHOLECYSTECTOMY    . COLON SURGERY    . COLOSTOMY REVERSAL    . INTRAMEDULLARY (IM) NAIL INTERTROCHANTERIC  Right 09/13/2016   Procedure: INTRAMEDULLARY (IM) NAIL INTERTROCHANTRIC RIGHT;  Surgeon: Leandrew Koyanagi, MD;  Location: [place];  Service: Orthopedics;  Laterality: Right;  . JOINT REPLACEMENT Bilateral   . KNEE SURGERY    . OSTOMY    . RECTOCELE REPAIR    . TOE SURGERY Right    3rd  . TOTAL HIP ARTHROPLASTY Right 09/30/2017   Procedure: RIGHT HIP IM NAIL REMOVAL WITH CONVERSION TO TOTAL HIP ARTHOPLASTY, anterior approach;  Surgeon: Renette Butters, MD;  Location: [place];  Service: Orthopedics;  Laterality: Right;   Family History  Problem Relation Age of Onset  . Heart failure Mother   . Heart attack Father   . Lung cancer Sister   . Deep vein thrombosis Sister   . Pulmonary embolism Son   . Deep vein thrombosis Son   . Cervical cancer Daughter    Social History   Tobacco Use  . Smoking status: Former Smoker    Packs/day: 1.50    Years: 30.00    Pack years: 45.00    Types: E-cigarettes, Cigarettes    Last attempt to quit: 2013    Years since quitting: 7.1  . Smokeless tobacco: Never Used  Substance Use Topics  . Alcohol  use: No    Alcohol/week: 0.0 standard drinks  . Drug use: No      Review of Systems Per HPI    Objective:   Physical Exam Vitals signs reviewed.  Constitutional:      Appearance: Normal appearance.  HENT:     Head: Normocephalic and atraumatic.  Cardiovascular:     Rate and Rhythm: Normal rate.  Pulmonary:     Effort: Pulmonary effort is normal.  Skin:    General: Skin is warm and dry.     Comments: Left shin with two small wounds. See image. No drainage. Appropriate surrounding erythema. Scabbing. No streaking. No warmth.   Neurological:     Mental Status: She is alert and oriented to person, place, and time.  Psychiatric:        Mood and Affect: Mood normal.        Behavior: Behavior normal.        Thought Content: Thought content normal.        Judgment: Judgment normal.          BP 140/84 (BP Location: Left Arm, Patient Position:  Sitting, Cuff Size: Normal)   Pulse 68   Temp 97.6 F (36.4 C) (Oral)   Wt 169 lb (76.7 kg)   SpO2 95%   BMI 30.91 kg/m  Wt Readings from Last 3 Encounters:  07/17/18 169 lb (76.7 kg)  07/03/18 166 lb 8 oz (75.5 kg)  06/12/18 168 lb (76.2 kg)    Assessment & Plan:  1. Leg wound, left, initial encounter - does not appear infected, discussed s/s infection and they will notify me if any drainage, redness, streaking, swelling, fever/chills.  - continue localized care   Clarene Reamer, FNP-BC  [facility], [facility], [facility], [facility]  07/19/2018 4:46 PM

## 2018-07-18 ENCOUNTER — Encounter: Payer: Self-pay | Admitting: Family Medicine

## 2018-07-19 ENCOUNTER — Encounter: Payer: Self-pay | Admitting: Family Medicine

## 2018-07-20 ENCOUNTER — Telehealth: Payer: Self-pay | Admitting: Family Medicine

## 2018-07-20 ENCOUNTER — Other Ambulatory Visit: Payer: Self-pay | Admitting: Family Medicine

## 2018-07-20 DIAGNOSIS — L03116 Cellulitis of left lower limb: Secondary | ICD-10-CM

## 2018-07-20 MED ORDER — DOXYCYCLINE HYCLATE 100 MG PO CAPS: 100.0000 mg | ORAL_CAPSULE | Freq: Two times a day (BID) | ORAL | 0 refills | Status: DC

## 2018-07-20 NOTE — Telephone Encounter (Signed)
Pt's daughter called to request a referral for PT sent to [facility] due to hip implant. [name] says you are awre but she can get from Dr Percell Miller if needed.  Also, she is requesting an antibiotic on her leg. She said she can send a pic when they get back to the house. She said her leg is very red and her clothes are causing her discomfort. Pharmacy[facility] [place]

## 2018-07-20 NOTE — Progress Notes (Signed)
Antibiotic sent in for leg, increased erythema since 07/17/18.

## 2018-07-20 NOTE — Telephone Encounter (Signed)
Pt's daughter is calling back and stated pt need some antibiotic. Please advise.    [facility] to [facility]

## 2018-07-20 NOTE — Telephone Encounter (Signed)
Cathy, patients daugther, is aware of message below which per DPR in chart is allowed by patient

## 2018-07-20 NOTE — Telephone Encounter (Signed)
Please call patient's daughter and let her know that I have sent in an antibiotic for her mother's leg. Keep clean and dry, stop any ointments. Take antibiotic with food and a full glass of water. The order for PT needs to come from orthopedics since they are the ones directing the therapy.

## 2018-07-20 NOTE — Telephone Encounter (Signed)
Please advise on below  

## 2018-07-22 ENCOUNTER — Other Ambulatory Visit: Payer: Self-pay | Admitting: Family Medicine

## 2018-07-22 DIAGNOSIS — M25551 Pain in right hip: Principal | ICD-10-CM

## 2018-07-22 DIAGNOSIS — G8929 Other chronic pain: Secondary | ICD-10-CM

## 2018-07-22 MED ORDER — OXYCODONE-ACETAMINOPHEN 5-325 MG PO TABS: 1.0000 | ORAL_TABLET | Freq: Four times a day (QID) | ORAL | 0 refills | Status: DC | PRN

## 2018-07-23 ENCOUNTER — Other Ambulatory Visit: Payer: Self-pay | Admitting: Family Medicine

## 2018-07-23 NOTE — Telephone Encounter (Signed)
Name of Medication: Alpazolam Duanne Moron) 1 MG tablet  Name of [facility]  Last Fill or Written Date and Quantity: 05/15/2018 #30 with 1 refill  Last Office Visit and Type: 07/17/2018 for acute   Next Office Visit and Type: None

## 2018-07-27 ENCOUNTER — Encounter: Payer: Self-pay | Admitting: Family Medicine

## 2018-07-27 ENCOUNTER — Ambulatory Visit (INDEPENDENT_AMBULATORY_CARE_PROVIDER_SITE_OTHER): Payer: Medicare Other | Admitting: Family Medicine

## 2018-07-27 VITALS — BP 128/86 | HR 87 | Temp 98.3°F | Resp 18 | Ht 64.0 in | Wt 176.0 lb

## 2018-07-27 DIAGNOSIS — R21 Rash and other nonspecific skin eruption: Secondary | ICD-10-CM

## 2018-07-27 DIAGNOSIS — M4802 Spinal stenosis, cervical region: Secondary | ICD-10-CM | POA: Diagnosis not present

## 2018-07-27 DIAGNOSIS — M25551 Pain in right hip: Secondary | ICD-10-CM

## 2018-07-27 DIAGNOSIS — M545 Low back pain, unspecified: Secondary | ICD-10-CM

## 2018-07-27 DIAGNOSIS — G8929 Other chronic pain: Secondary | ICD-10-CM

## 2018-07-27 MED ORDER — OXYCODONE HCL 5 MG PO TABS: 5.0000 mg | ORAL_TABLET | Freq: Three times a day (TID) | ORAL | 0 refills | Status: DC | PRN

## 2018-07-27 NOTE — Patient Instructions (Addendum)
Take a daily antihistamine like cetirizine, loratadine or fexofenadine for itching  Apply a thick cream like Eucerin or coconut oil or olive oil and apply socks  Avoid scratching  I have sent in a small amount of plain pain medication without acetaminophen. This is to be used for only severe pain and needs to last until she is seen by pain management.

## 2018-07-27 NOTE — Progress Notes (Signed)
Subjective:    Patient ID: [name], female    DOB: [birth year], 78 y.o.   MRN: [id number]  HPI This is a 78 yo female, accompanied by her daughter, who presents today with itching on her back and foot. Itching for about 3 days. No new soaps, lotions, shampoos, detergents. Has been using hydrocortisone cream without relief. Some cough with mucus. Feels bad for last 3 days. Her son moved in with them but has left. This increased her stress.   Past Medical History:  Diagnosis Date  . Acute postoperative pain 02/03/2018  . Anemia   . B12 deficiency   . Bacteremia 10/07/2014  . Bladder incontinence   . Blind left eye   . Cancer ([place])    skin cancer   . Cataract   . Cervical spine fracture ([place])   . Chest pain 07/29/2013  . Compression fracture    C7,  upper T spine -compression fx  . COPD (chronic obstructive pulmonary disease) ([place])   . DDD (degenerative disc disease), lumbar   . Depression    major  . Diffuse myofascial pain syndrome 03/24/2015  . Dyspnea    at times- when activty  . Fever 10/05/2014  . GERD (gastroesophageal reflux disease)   . Hiatal hernia   . Hyperlipidemia   . Hypertension   . 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 Macular degeneration   . On home oxygen therapy    "3L; all the time" (04/30/2018)  . Osteoarthritis   . Osteoporosis   . Pneumonia    years ago  . Pulmonary embolism ([place]) 04/30/2018  . Reactive airway disease   . Rupture of bowel ([place])   . Sepsis ([place]) 10/08/2014  . Stroke [facility])    "mini stroke"- balance and memory issue  . Stroke ([place])   . Wrist pain, acute 09/10/2012   Past Surgical History:  Procedure Laterality Date  . ABDOMINAL HYSTERECTOMY    . ABDOMINAL SURGERY    . APPENDECTOMY    . BLADDER SURGERY    . CATARACT EXTRACTION W/ INTRAOCULAR LENS  IMPLANT, BILATERAL Bilateral   . CHOLECYSTECTOMY    . COLON SURGERY    . COLOSTOMY REVERSAL    . INTRAMEDULLARY (IM) NAIL INTERTROCHANTERIC Right 09/13/2016   Procedure: INTRAMEDULLARY (IM) NAIL INTERTROCHANTRIC RIGHT;  Surgeon: Leandrew Koyanagi, MD;  Location: [place];  Service: Orthopedics;  Laterality: Right;  . JOINT REPLACEMENT Bilateral   . KNEE SURGERY    . OSTOMY    . RECTOCELE REPAIR    . TOE SURGERY Right    3rd  . TOTAL HIP ARTHROPLASTY Right 09/30/2017   Procedure: RIGHT HIP IM NAIL REMOVAL WITH CONVERSION TO TOTAL HIP ARTHOPLASTY, anterior approach;  Surgeon: Renette Butters, MD;  Location: [place];  Service: Orthopedics;  Laterality: Right;   Family History  Problem Relation Age of Onset  . Heart failure Mother   . Heart attack Father   . Lung cancer Sister   . Deep vein thrombosis Sister   . Pulmonary embolism Son   . Deep vein thrombosis Son   . Cervical cancer Daughter    Social History   Tobacco Use  . Smoking status: Former Smoker    Packs/day: 1.50    Years: 30.00    Pack years: 45.00    Types: E-cigarettes, Cigarettes    Last attempt to quit: 2013    Years since quitting: 7.1  . Smokeless tobacco: Never Used  Substance Use Topics  . Alcohol  use: No    Alcohol/week: 0.0 standard drinks  . Drug use: No      Review of Systems Per HPI    Objective:   Physical Exam Vitals signs reviewed.  Constitutional:      General: She is not in acute distress.    Appearance: Normal appearance. She is not ill-appearing, toxic-appearing or diaphoretic.     Comments: Chronically appearing female in wheelchair.   HENT:     Head: Normocephalic and atraumatic.  Eyes:     Conjunctiva/sclera: Conjunctivae normal.  Cardiovascular:     Rate and Rhythm: Normal rate and regular rhythm.     Heart sounds: Normal heart sounds.  Pulmonary:     Effort: Pulmonary effort is normal.     Breath sounds: Normal breath sounds.  Skin:    General: Skin is warm and dry.     Comments: Upper back with few papules. No erythema or drainage.  Left foot with scaling and mild excoriation. No drainage or erythema.   Neurological:     Mental Status:  She is alert and oriented to person, place, and time.  Psychiatric:        Mood and Affect: Mood normal.        Behavior: Behavior normal.        Thought Content: Thought content normal.        Judgment: Judgment normal.      BP 128/86   Pulse 87   Temp 98.3 F (36.8 C) (Oral)   Resp 18   Ht 5\' 4"  (1.626 m)   Wt 176 lb (79.8 kg)   SpO2 90%   BMI 30.21 kg/m  Wt Readings from Last 3 Encounters:  07/27/18 176 lb (79.8 kg)  07/17/18 169 lb (76.7 kg)  07/03/18 166 lb 8 oz (75.5 kg)        Assessment & Plan:  1. Cervical central spinal stenosis - will provide small amount pain medication for acute pain, especially related to physical therapy - she has upcoming appointment with pain mangement - oxyCODONE (OXY IR/ROXICODONE) 5 MG immediate release tablet; Take 1-2 tablets (5-10 mg total) by mouth every 8 (eight) hours as needed for severe pain.  Dispense: 30 tablet; Refill: 0  2. 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  Dispense: 30 tablet; Refill: 0  3. Chronic hip pain (Right) - oxyCODONE (OXY IR/ROXICODONE) 5 MG immediate release tablet; Take 1-2 tablets (5-10 mg total) by mouth every 8 (eight) hours as needed for severe pain.  Dispense: 30 tablet; Refill: 0  4. Rash and nonspecific skin eruption - appears to be related to dry skin, provided information regarding hydrating skin -  Patient Instructions  Take a daily antihistamine like cetirizine, loratadine or fexofenadine for itching  Apply a thick cream like Eucerin or coconut oil or olive oil and apply socks  Avoid scratching  I have sent in a small amount of plain pain medication without acetaminophen. This is to be used for only severe pain and needs to last until she is seen by pain management.       Clarene Reamer, FNP-BC  [facility] at [facility], [facility]  08/02/2018 8:58 PM

## 2018-07-28 IMAGING — CT CT ANGIO CHEST
2 of 6 series · 18 of 36 positions shown · IV contrast (isovue)
Comparison: Chest CT April 15, 2016; chest radiograph October 23, 2016

CLINICAL DATA: Shortness of breath and chest pain

EXAM:
CT ANGIOGRAPHY CHEST WITH CONTRAST
TECHNIQUE: Multidetector CT imaging of the chest was performed using the
standard protocol during bolus administration of intravenous
contrast. Multiplanar CT image reconstructions and MIPs were
obtained to evaluate the vascular anatomy.
CONTRAST:  50 mL Isovue 370 nonionic

[Series 7: pe thins · axial · 0.58mm/px · z∈[-734,-497]mm · 17 of 267 slices shown]
[im 15/267  lung]
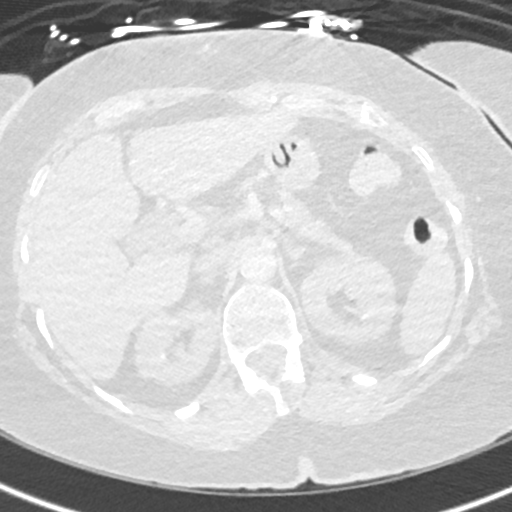
[im 30/267  mediastinal]
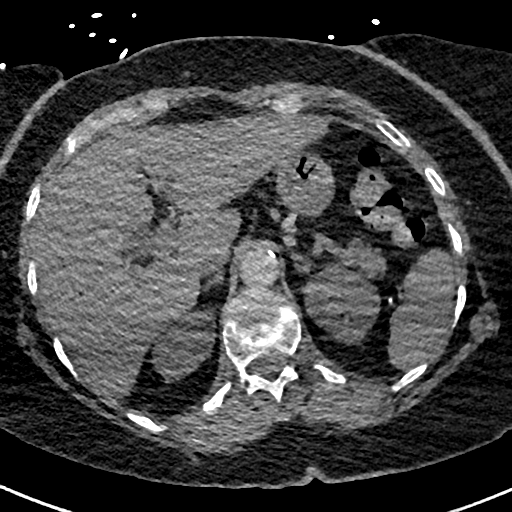
[im 45/267  lung]
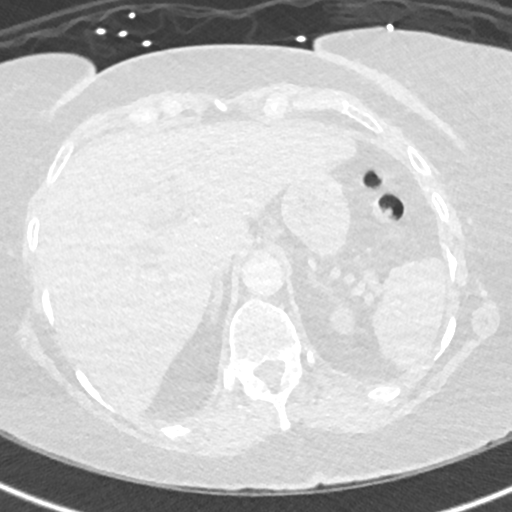
[im 60/267  mediastinal]
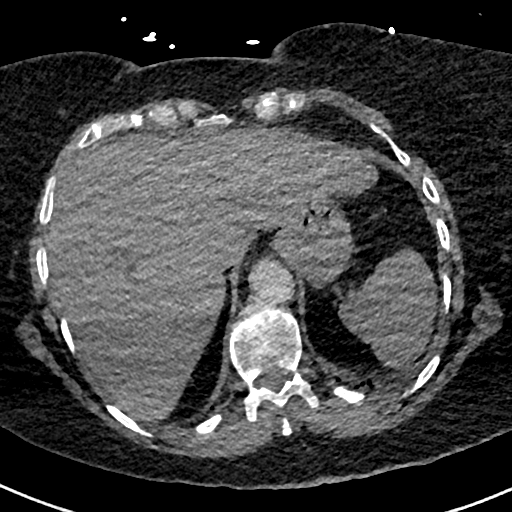
[im 74/267  lung]
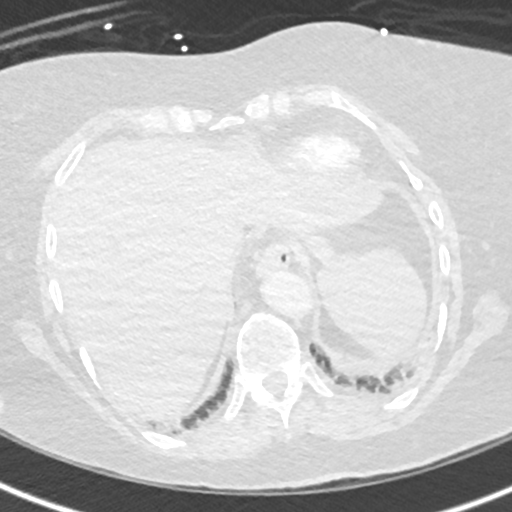
[im 89/267  mediastinal]
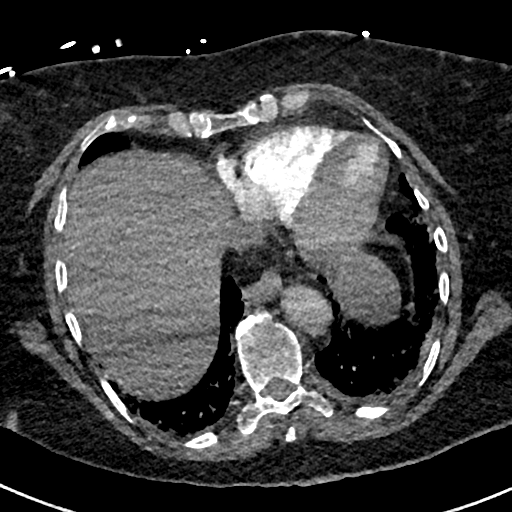
[im 104/267  lung]
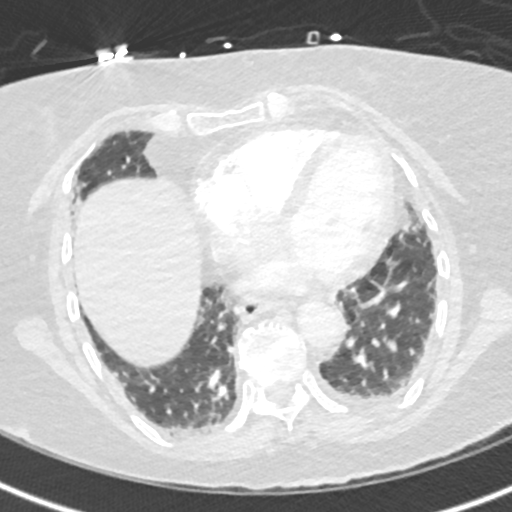
[im 119/267  mediastinal]
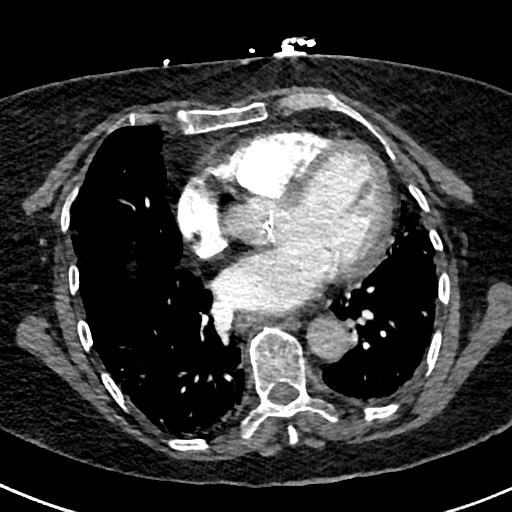
[im 134/267  lung]
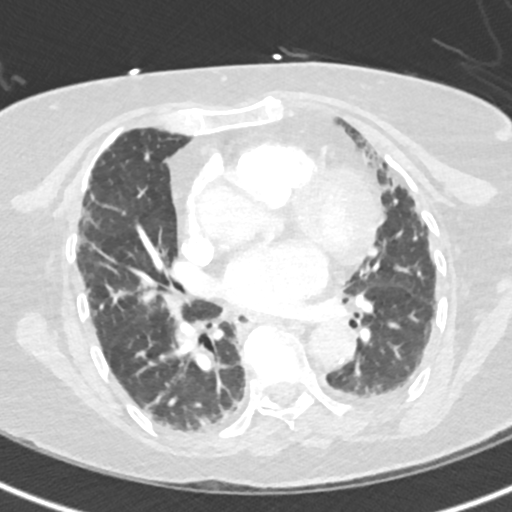
[im 148/267  mediastinal]
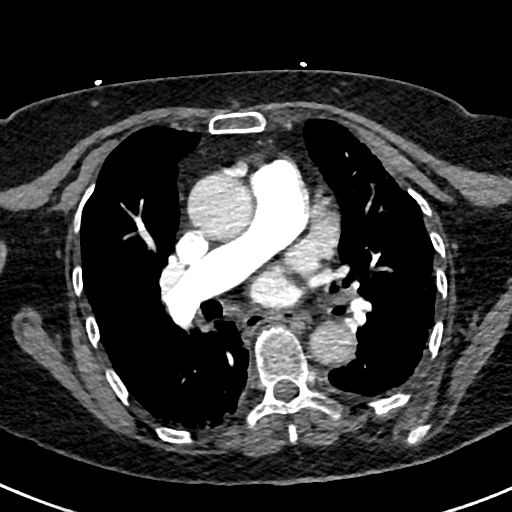
[im 163/267  lung]
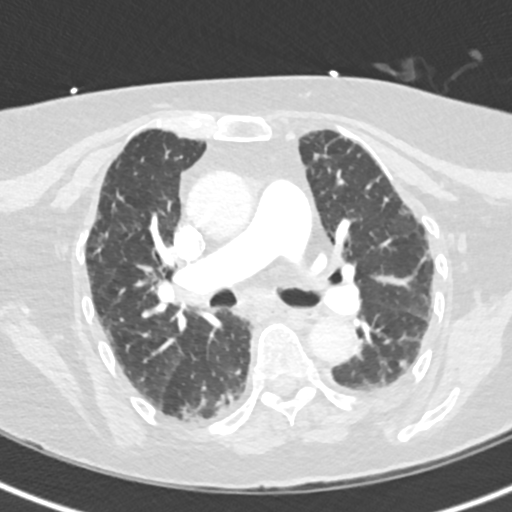
[im 178/267  mediastinal]
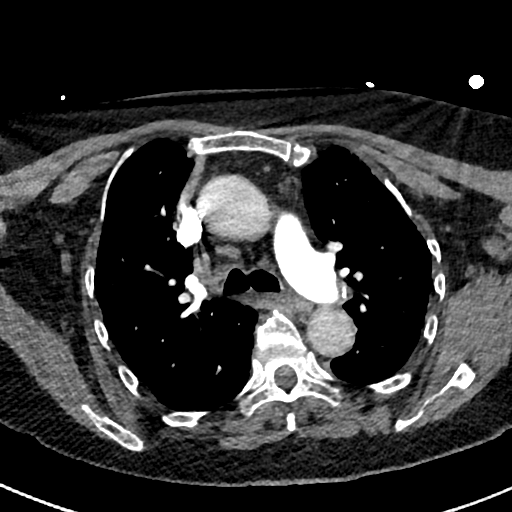
[im 193/267  lung]
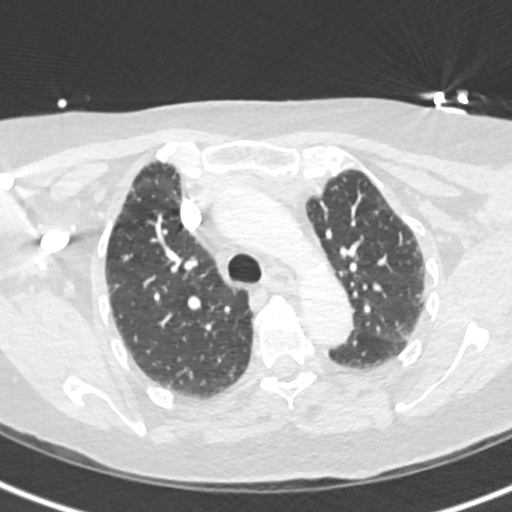
[im 207/267  mediastinal]
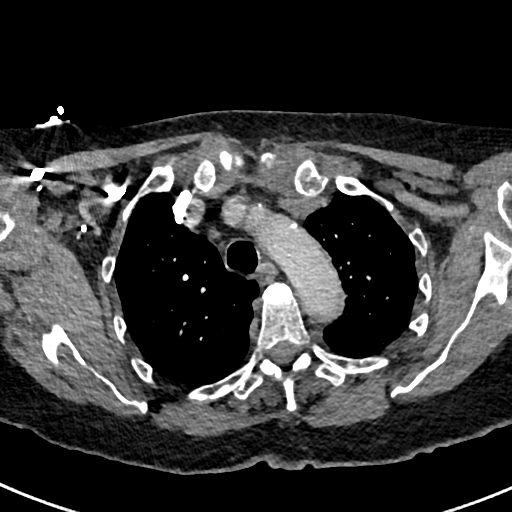
[im 222/267  lung]
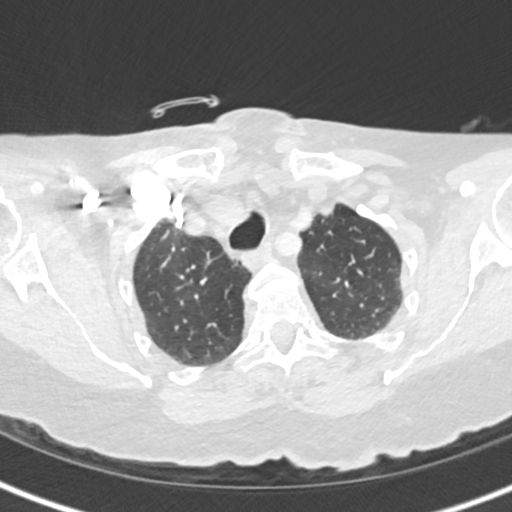
[im 237/267  mediastinal]
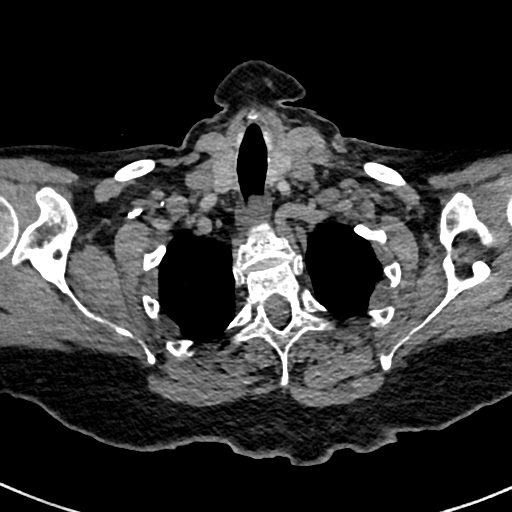
[im 252/267  lung]
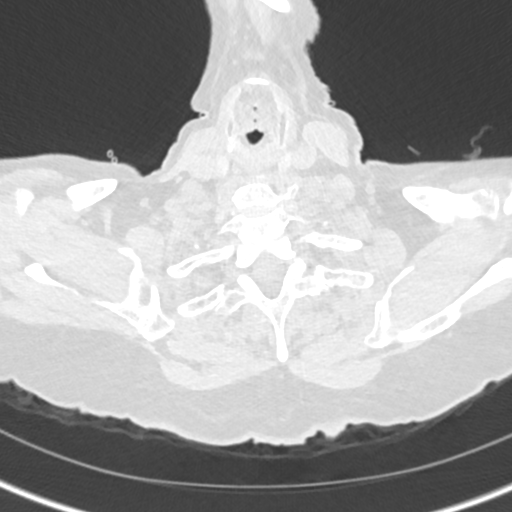

[Series 8: pe 2mm cor · coronal · 0.55mm/px · 1 of 107 slices shown]
[im 54/107  mediastinal]
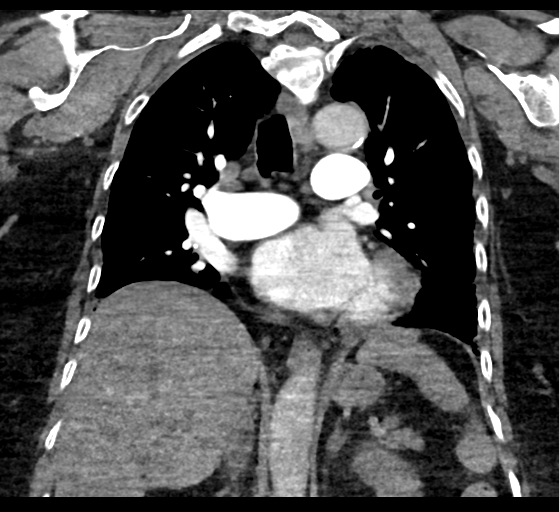

[18 of 36 positions shown; findings below may reference images not displayed]

FINDINGS: Cardiovascular: There is no demonstrable pulmonary embolus. The main
pulmonary outflow tract measures 3.3 cm in diameter, a finding
raising question of a degree of pulmonary artery hypertension. There
is no appreciable thoracic aortic aneurysm or dissection. There are
scattered foci of atherosclerotic calcification in the proximal
great vessels. There also foci of atherosclerotic calcification in
the aorta. Pericardium is not appreciably thickened.

Mediastinum/Nodes: Thyroid appears unremarkable. There are stable
scattered subcentimeter mediastinal lymph nodes. There is a
prominent sub- carinal lymph node measuring 1.7 x 1.7 cm. No other
lymph node enlargement is evident. There is a focal hiatal hernia.

Lungs/Pleura: There is patchy bibasilar atelectasis. There are areas
of fibrotic change in the periphery of the lungs primarily in the
lower lobes but also to a lesser extent in portions of the lingula
and right middle lobe. There is no airspace consolidation or edema.
There is a calcified granuloma in the posterior left apex. There is
slight pleural thickening in the posterior base regions, benign in
appearance.

Upper Abdomen: There are a few calcified splenic granulomas. There
are foci of atherosclerotic calcification in the upper abdominal
aorta. Visualized upper abdominal structures otherwise appear
normal.

Musculoskeletal: There is stable anterior wedging of the T1 and T2
vertebral bodies. There are no blastic or lytic bone lesions.

Review of the MIP images confirms the above findings.
IMPRESSION: No demonstrable pulmonary embolus. Question degree of pulmonary
arterial hypertension.

No thoracic aortic aneurysm or dissection. Foci of atherosclerotic
calcification noted.

No lung edema or consolidation. Mild peripheral fibrotic type change
remain stable. Bibasilar atelectasis noted.

Enlarged sub- carinal lymph node of uncertain etiology. No other
lymph node enlargement. This lymph node was mildly prominent on
previous study but is larger currently.

Focal hiatal hernia evident.

## 2018-07-28 IMAGING — DX DG CHEST 1V PORT
1 series · 1 of 1 positions shown · non-contrast
Comparison: Chest x-ray of September 22, 2016

CLINICAL DATA: Right upper back pain since 3 a.m. today.

EXAM:
PORTABLE CHEST 1 VIEW

[chest ap]
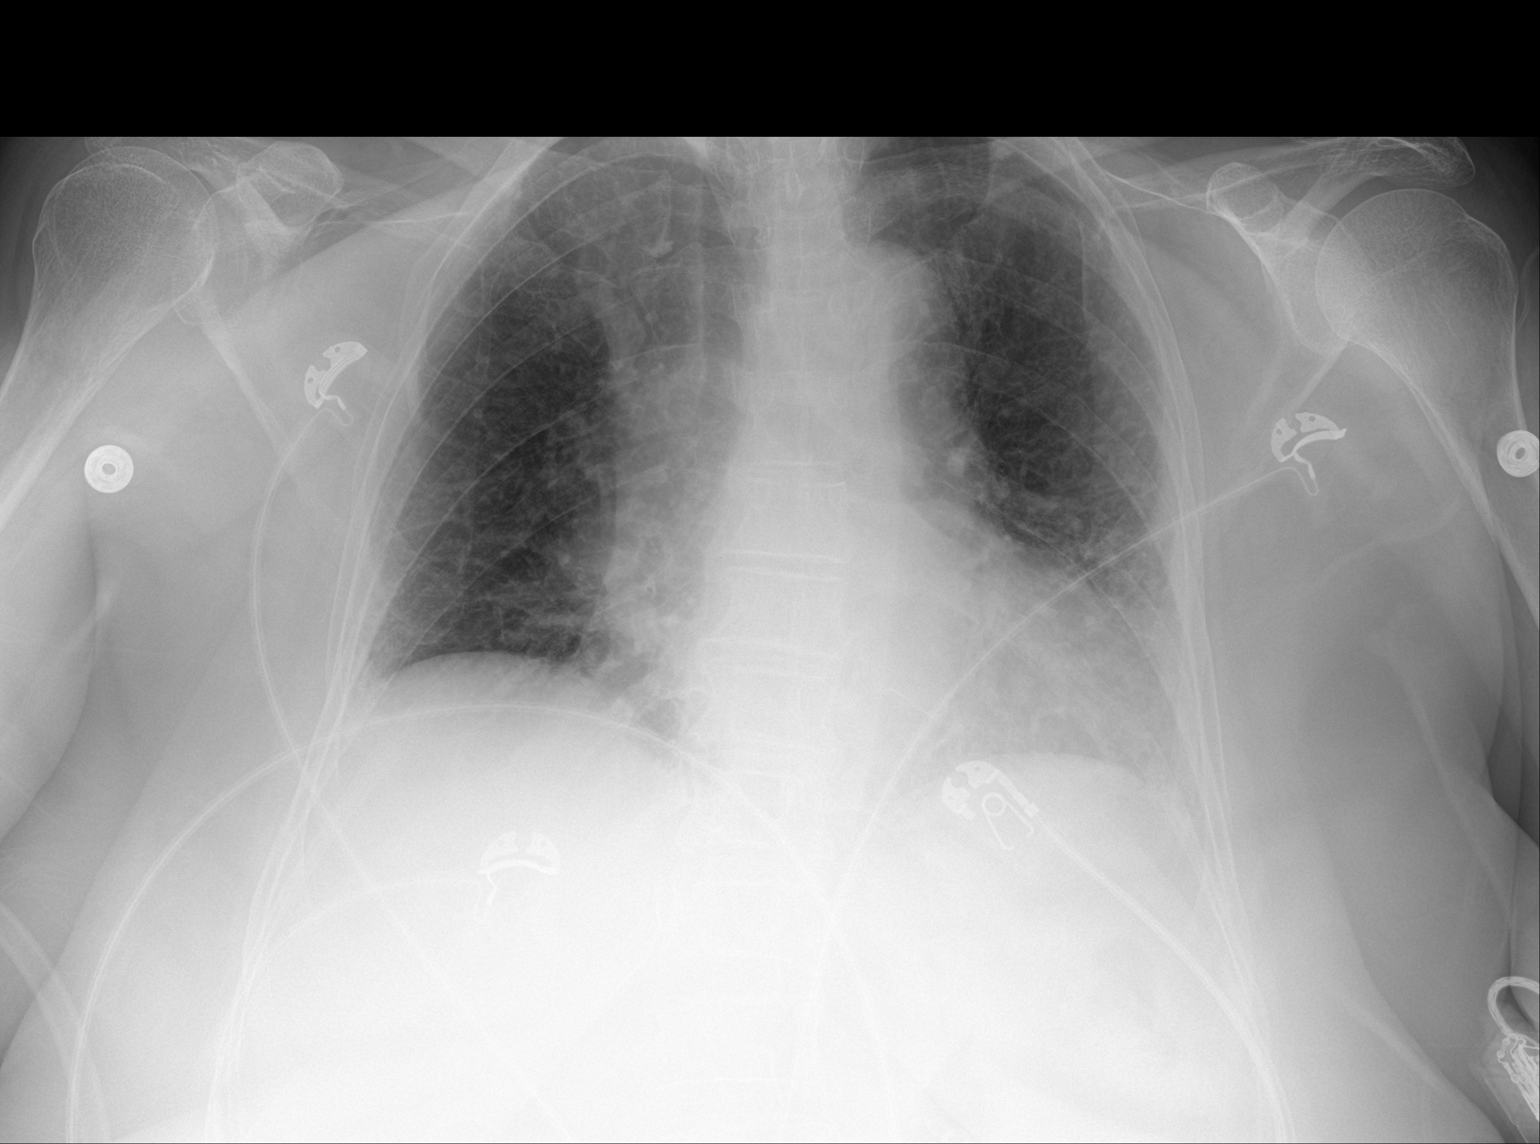

[1 of 1 positions shown; findings below may reference images not displayed]

FINDINGS: The lungs are adequately inflated. The interstitial markings are
coarse though stable. There is no alveolar infiltrate. The cardiac
silhouette is enlarged but stable. The pulmonary vascularity is not
engorged. The mediastinum is normal in width. There is calcification
in the wall of the thoracic aorta. The observed bony thorax exhibits
no acute abnormality.
IMPRESSION: Mild chronic bronchitic changes. Mild cardiomegaly. No acute
cardiopulmonary abnormality.

Thoracic aortic atherosclerosis.

## 2018-08-02 ENCOUNTER — Other Ambulatory Visit: Payer: Self-pay

## 2018-08-02 ENCOUNTER — Emergency Department
Admission: EM | Admit: 2018-08-02 | Discharge: 2018-08-02 | Disposition: A | Discharge status: Home / Self Care | Payer: Medicare Other | Attending: Emergency Medicine | Admitting: Emergency Medicine

## 2018-08-02 ENCOUNTER — Emergency Department: Payer: Medicare Other

## 2018-08-02 ENCOUNTER — Encounter: Payer: Self-pay | Admitting: Family Medicine

## 2018-08-02 DIAGNOSIS — Z87891 Personal history of nicotine dependence: Secondary | ICD-10-CM | POA: Insufficient documentation

## 2018-08-02 DIAGNOSIS — Z79899 Other long term (current) drug therapy: Secondary | ICD-10-CM | POA: Insufficient documentation

## 2018-08-02 DIAGNOSIS — J449 Chronic obstructive pulmonary disease, unspecified: Secondary | ICD-10-CM | POA: Diagnosis not present

## 2018-08-02 DIAGNOSIS — Z96641 Presence of right artificial hip joint: Secondary | ICD-10-CM | POA: Diagnosis not present

## 2018-08-02 DIAGNOSIS — N183 Chronic kidney disease, stage 3 (moderate): Secondary | ICD-10-CM | POA: Insufficient documentation

## 2018-08-02 DIAGNOSIS — I129 Hypertensive chronic kidney disease with stage 1 through stage 4 chronic kidney disease, or unspecified chronic kidney disease: Secondary | ICD-10-CM | POA: Insufficient documentation

## 2018-08-02 DIAGNOSIS — M25551 Pain in right hip: Secondary | ICD-10-CM

## 2018-08-02 DIAGNOSIS — G8929 Other chronic pain: Secondary | ICD-10-CM | POA: Diagnosis not present

## 2018-08-02 MED ORDER — PREDNISONE 50 MG PO TABS: ORAL_TABLET | ORAL | 0 refills | Status: DC

## 2018-08-02 MED ORDER — DEXAMETHASONE SODIUM PHOSPHATE 10 MG/ML IJ SOLN
10.0000 mg | Freq: Once | INTRAMUSCULAR | Status: AC
  Administered 2018-08-02: 10 mg via INTRAMUSCULAR
  Filled 2018-08-02: qty 1

## 2018-08-02 NOTE — ED Provider Notes (Signed)
[facility] Emergency Department Provider Note  ____________________________________________  Time seen: Approximately 3:53 PM  I have reviewed the triage vital signs and the nursing notes.   HISTORY  Chief Complaint Hip Pain    HPI [name] is a 78 y.o. female presents to the emergency department with chronic right hip pain.  Patient reports that she had a total right hip arthroplasty proximately 2 years ago and has never felt like her surgery was successful.  Patient reports that her pain is worsened at night.  She denies numbness or tingling of the right lower extremity.  No falls or traumas.  Patient denies fever or noticing redness of the skin around the hip.  Patient followed up with her orthopedic surgeon, Dr. Percell Miller approximately 6 months ago.        Past Medical History:  Diagnosis Date  . Acute postoperative pain 02/03/2018  . Anemia   . B12 deficiency   . Bacteremia 10/07/2014  . Bladder incontinence   . Blind left eye   . Cancer ([place])    skin cancer   . Cataract   . Cervical spine fracture ([place])   . Chest pain 07/29/2013  . Compression fracture    C7,  upper T spine -compression fx  . COPD (chronic obstructive pulmonary disease) ([place])   . DDD (degenerative disc disease), lumbar   . Depression    major  . Diffuse myofascial pain syndrome 03/24/2015  . Dyspnea    at times- when activty  . Fever 10/05/2014  . GERD (gastroesophageal reflux disease)   . Hiatal hernia   . Hyperlipidemia   . Hypertension   . 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 Macular degeneration   . On home oxygen therapy    "3L; all the time" (04/30/2018)  . Osteoarthritis   . Osteoporosis   . Pneumonia    years ago  . Pulmonary embolism ([place]) 04/30/2018  . Reactive airway disease   . Rupture of bowel ([place])   . Sepsis ([place]) 10/08/2014  . Stroke [facility])    "mini stroke"- balance and memory issue  . Stroke ([place])   . Wrist pain, acute 09/10/2012     Patient Active Problem List   Diagnosis Date Noted  . COPD with acute exacerbation ([place]) 05/17/2018  . Pressure injury of skin 05/04/2018  . PE (pulmonary thromboembolism) ([place]) 04/30/2018  . Acute metabolic encephalopathy   . Respiratory failure with hypoxia ([place]) 04/20/2018  . CAP (community acquired pneumonia) 04/20/2018  . Acute lower UTI 04/20/2018  . Chronic lower extremity pain (Right) 04/07/2018  . Abnormal MRI, lumbar spine 04/07/2018  . Lumbar facet hypertrophy (Multilevel) (Bilateral) 04/07/2018  . Lumbar foraminal stenosis (L3-4, L4-5) (Bilateral) 04/07/2018  . Annular tear of lumbar disc (L3-4, L4-5) 04/07/2018  . Closed hip fracture, sequela (Right) 02/03/2018  . It band syndrome, right 12/18/2017  . History of hip replacement (Right) 12/18/2017  . Ileus ([place]) 10/27/2017  . Primary osteoarthritis of hip 09/30/2017  . History of small bowel obstruction 09/10/2017  . 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 SBO (small bowel obstruction) ([place]) 08/24/2017  . Abnormal x-ray of pelvis 08/13/2017  . Other specified dorsopathies, sacral and sacrococcygeal region 08/04/2017  . Spondylosis without myelopathy or radiculopathy, lumbosacral region 07/17/2017  . Non-traumatic compression fracture of T3 thoracic vertebra, sequela 07/02/2017  . High risk medication use 07/02/2017  . At high risk for falls 07/02/2017  . Chronic sacroiliac joint pain (Right) 07/02/2017  .  CKD (chronic kidney disease) stage 3, GFR 30-59 ml/min (HCC) 04/23/2017  . Chronic knee pain s/p total knee replacement (TKR) (Right) 04/02/2017  . DDD (degenerative disc disease), lumbar 03/05/2017  . Chronic knee pain (Bilateral) (R>L) 03/05/2017  . Osteoarthritis 03/05/2017  . Chronic musculoskeletal pain 03/05/2017  . Neurogenic pain 03/05/2017  . Chronic myofascial pain 02/12/2017  . Lumbar facet syndrome (Bilateral) (L>R) 02/12/2017  . Long term prescription benzodiazepine use  02/12/2017  . Opiate use 02/12/2017  . Disorder of skeletal system 02/12/2017  . Pharmacologic therapy 02/12/2017  . Problems influencing health status 02/12/2017  . 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 NSAID long-term use 02/12/2017  . DDD (degenerative disc disease), cervical 11/11/2016  . Lumbar facet arthropathy (Bilateral) 11/11/2016  . 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 CAFL (chronic airflow limitation) ([place]) 11/07/2016  . Depression, major, in partial remission ([place]) 11/07/2016  . 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 Bed wetting 11/07/2016  . Long term current use of opiate analgesic 11/07/2016  . Long term prescription opiate use 11/07/2016  . Chronic pain syndrome 11/07/2016  . Chronic neck pain (Secondary Area of Pain) (Bilateral) (L>R) 11/07/2016  . Chronic hip pain (Right) 11/07/2016  . Rib pain 11/07/2016  . Age-related osteoporosis with current pathological fracture with routine healing 09/20/2016  . History of stroke 09/20/2016  . Major depression in remission ([place]) 09/20/2016  . Intertrochanteric fracture of femur, sequela (Right) 09/13/2016  . Fall   . Chronic shoulder pain (Bilateral) 08/12/2016  . Hx of total knee replacement (Bilateral) 04/04/2016  . Chronic shoulder pain (Left) 02/28/2016  . Hoarseness 01/12/2016  . Pharyngoesophageal dysphagia 01/12/2016  . Chronic low back pain [facility] Area of Pain) (Bilateral) (L>R) 10/26/2015  . 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 Syncope, non cardiac 05/22/2015  . Thrombocytopenia ([place]) 10/07/2014  . Hypoxia 10/05/2014  . Anxiety 06/15/2014  . Duodenogastric reflux 06/15/2014  . Benign essential HTN 06/15/2014  . Hypercholesteremia 07/30/2013  . HTN (hypertension) 07/30/2013  . GERD (gastroesophageal reflux disease) 07/30/2013  . Dizziness and giddiness 06/18/2013  .  Memory loss 05/03/2013  . Chronic knee pain (Primary Area of Pain) (Right) 09/10/2012    Past Surgical History:  Procedure Laterality Date  . ABDOMINAL HYSTERECTOMY    . ABDOMINAL SURGERY    . APPENDECTOMY    . BLADDER SURGERY    . CATARACT EXTRACTION W/ INTRAOCULAR LENS  IMPLANT, BILATERAL Bilateral   . CHOLECYSTECTOMY    . COLON SURGERY    . COLOSTOMY REVERSAL    . INTRAMEDULLARY (IM) NAIL INTERTROCHANTERIC Right 09/13/2016   Procedure: INTRAMEDULLARY (IM) NAIL INTERTROCHANTRIC RIGHT;  Surgeon: Leandrew Koyanagi, MD;  Location: [place];  Service: Orthopedics;  Laterality: Right;  . JOINT REPLACEMENT Bilateral   . KNEE SURGERY    . OSTOMY    . RECTOCELE REPAIR    . TOE SURGERY Right    3rd  . TOTAL HIP ARTHROPLASTY Right 09/30/2017   Procedure: RIGHT HIP IM NAIL REMOVAL WITH CONVERSION TO TOTAL HIP ARTHOPLASTY, anterior approach;  Surgeon: Renette Butters, MD;  Location: [place];  Service: Orthopedics;  Laterality: Right;    Prior to Admission medications   Medication Sig Start Date End Date Taking? Authorizing Provider  albuterol (ACCUNEB) 1.25 MG/3ML nebulizer solution Take 1 ampule by nebulization every 6 (six) hours as needed for wheezing.    [provider]  albuterol (PROVENTIL HFA;VENTOLIN  HFA) 108 (90 BASE) MCG/ACT inhaler Inhale 2 puffs into the lungs every 6 (six) hours as needed for wheezing or shortness of breath.     [provider]  alendronate (FOSAMAX) 70 MG tablet Take 70 mg by mouth every Monday. Take with a full glass of water on an empty stomach.    [provider]  ALPRAZolam Duanne Moron) 1 MG tablet TAKE 1 TABLET (1 MG TOTAL) BY MOUTH AT BEDTIME AS NEEDED FOR SLEEP. 07/24/18   [name], FNP  amLODipine (NORVASC) 5 MG tablet Take 5 mg by mouth daily.  04/06/13   [provider]  apixaban (ELIQUIS) 5 MG TABS tablet Take 1 tablet (5 mg total) by mouth 2 (two) times daily. Start after 10mg  twice day dosing 05/18/18 06/17/18  Bettey Costa, MD  Calcium 500-100 MG-UNIT CHEW Chew 1 tablet by mouth daily.    [provider]  Cholecalciferol (VITAMIN D HIGH POTENCY PO) Take 5,000 Units by mouth daily.    [provider]  citalopram (CELEXA) 20 MG tablet Take 20 mg by mouth daily.  02/27/17   [provider]  docusate sodium (COLACE) 100 MG capsule Take 200 mg by mouth 2 (two) times daily as needed for moderate constipation.     [provider]  doxycycline (VIBRAMYCIN) 100 MG capsule Take 1 capsule (100 mg total) by mouth 2 (two) times daily. 07/20/18   [name], FNP  Elastic Bandages & Supports (MEDICAL COMPRESSION SOCKS) MISC 1 each by Does not apply route daily as needed. 05/15/18   [name], FNP  esomeprazole (NEXIUM) 40 MG capsule Take 40 mg by mouth daily.     [provider]  fluticasone (FLOVENT HFA) 220 MCG/ACT inhaler Inhale 1 puff into the lungs 2 (two) times daily.    [provider]  guaiFENesin (MUCINEX) 600 MG 12 hr tablet Take 600 mg by mouth 2 (two) times daily as needed for cough.    [provider]  Javier Docker Oil 500 MG CAPS Take 500 mg by mouth daily.     [provider]  linaclotide (LINZESS) 290 MCG CAPS capsule Take 290 mcg by mouth daily.    [provider]  lovastatin (MEVACOR) 10 MG tablet Take 10 mg by mouth daily. 09/22/14   [provider]  Menthol, Topical Analgesic, (BIOFREEZE EX) Apply 1 application topically daily as needed (muscle pain).    [provider]  ondansetron (ZOFRAN ODT) 4 MG disintegrating tablet Take 1 tablet (4 mg total) by mouth every 8 (eight) hours as needed for nausea or vomiting. 06/12/18   [name], FNP  oxyCODONE (OXY IR/ROXICODONE) 5 MG immediate release tablet Take 1-2 tablets (5-10 mg total) by mouth every 8 (eight) hours as needed for severe pain. 07/27/18   [name], FNP  oxyCODONE-acetaminophen (PERCOCET/ROXICET) 5-325 MG tablet Take 1-2 tablets  by mouth every 6 (six) hours as needed for severe pain. 07/22/18   [name], FNP  polyethylene glycol (MIRALAX / Floria Raveling) packet Take 17 g by mouth daily as needed (for constipation.).     [provider]  predniSONE (DELTASONE) 50 MG tablet Take one 50 mg tablet once daily for the next five days. 08/02/18   Lannie Fields, PA-C  pregabalin (LYRICA) 100 MG capsule Take 1 capsule (100 mg total) by mouth 2 (two) times daily. 06/25/18 12/22/18  Copland, Frederico Hamman, MD  traZODone (DESYREL) 100 MG tablet Take 100 mg by mouth at bedtime.  [provider]  vitamin B-12 (CYANOCOBALAMIN) 500 MCG tablet Take 500 mcg by mouth daily.    [provider]    Allergies Ampicillin; Ampicillin-sulbactam sodium; Ambien [zolpidem tartrate]; Flexeril [cyclobenzaprine]; Tape; Toradol [ketorolac tromethamine]; Sulfamethoxazole-trimethoprim; Zolpidem; Cephalexin; and Tramadol  Family History  Problem Relation Age of Onset  . Heart failure Mother   . Heart attack Father   . Lung cancer Sister   . Deep vein thrombosis Sister   . Pulmonary embolism Son   . Deep vein thrombosis Son   . Cervical cancer Daughter     Social History Social History   Tobacco Use  . Smoking status: Former Smoker    Packs/day: 1.50    Years: 30.00    Pack years: 45.00    Types: E-cigarettes, Cigarettes    Last attempt to quit: 2013    Years since quitting: 7.1  . Smokeless tobacco: Never Used  Substance Use Topics  . Alcohol use: No    Alcohol/week: 0.0 standard drinks  . Drug use: No     Review of Systems  Constitutional: No fever/chills Eyes: No visual changes. No discharge ENT: No upper respiratory complaints. Cardiovascular: no chest pain. Respiratory: no cough. No SOB. Gastrointestinal: No abdominal pain.  No nausea, no vomiting.  No diarrhea.  No constipation. Genitourinary: Negative for dysuria. No hematuria Musculoskeletal: Patient has right hip pain.  Skin: Negative for rash,  abrasions, lacerations, ecchymosis. Neurological: Negative for headaches, focal weakness or numbness.   ____________________________________________   PHYSICAL EXAM:  VITAL SIGNS: ED Triage Vitals  Enc Vitals Group     BP 08/02/18 1423 (!) 145/91     Pulse Rate 08/02/18 1423 92     Resp 08/02/18 1423 18     Temp 08/02/18 1423 97.6 F (36.4 C)     Temp Source 08/02/18 1423 Oral     SpO2 08/02/18 1423 95 %     Weight 08/02/18 1424 169 lb (76.7 kg)     Height 08/02/18 1424 5\' 4"  (1.626 m)     Head Circumference --      Peak Flow --      Pain Score 08/02/18 1424 10     Pain Loc --      Pain Edu? --      Excl. in [place]? --      Constitutional: Alert and oriented. Well appearing and in no acute distress. Eyes: Conjunctivae are normal. PERRL. EOMI. Head: Atraumatic. Cardiovascular: Normal rate, regular rhythm. Normal S1 and S2.  Good peripheral circulation. Respiratory: Normal respiratory effort without tachypnea or retractions. Lungs CTAB. Good air entry to the bases with no decreased or absent breath sounds. Gastrointestinal: Bowel sounds 4 quadrants. Soft and nontender to palpation. No guarding or rigidity. No palpable masses. No distention. No CVA tenderness. Musculoskeletal: She has pain with internal and external rotation at the right hip.  Full range of motion at the right knee.  Palpable dorsalis pedis pulse, right. Neurologic:  Normal speech and language. No gross focal neurologic deficits are appreciated.  Skin:  Skin is warm, dry and intact. No rash noted. Psychiatric: Mood and affect are normal. Speech and behavior are normal. Patient exhibits appropriate insight and judgement.   ____________________________________________   LABS (all labs ordered are listed, but only abnormal results are displayed)  Labs Reviewed - No data to display ____________________________________________  EKG   ____________________________________________  RADIOLOGY I personally  viewed and evaluated these images as part of my medical decision making, as well as reviewing the  written report by the radiologist.    Dg Hip Unilat W Or Wo Pelvis 2-3 Views Right  Result Date: 08/02/2018 CLINICAL DATA:  Painful right hip. EXAM: DG HIP (WITH OR WITHOUT PELVIS) 2-3V RIGHT COMPARISON:  June 25, 2018 FINDINGS: Status post right hip replacement. Acetabular and femoral components are in good position. No acute fractures. No other acute abnormalities. No dislocation. IMPRESSION: Status post right hip replacement.  No acute abnormality. Electronically Signed   By: Dorise Bullion III M.D   On: 08/02/2018 16:45    ____________________________________________    PROCEDURES  Procedure(s) performed:    Procedures    Medications  dexamethasone (DECADRON) injection 10 mg (10 mg Intramuscular Given 08/02/18 1708)     ____________________________________________   INITIAL IMPRESSION / ASSESSMENT AND PLAN / ED COURSE  Pertinent labs & imaging results that were available during my care of the patient were reviewed by me and considered in my medical decision making (see chart for details).  Review of the [place] CSRS was performed in accordance of the [place] prior to dispensing any controlled drugs.           Assessment and plan:  Right hip pain Patient presents to the emergency department with chronic right hip pain that has occurred intermittently over the past 2 years.  X-ray examination of the right hip occurred in the emergency department today and there were no acute fractures or disruption of the hardware.  Patient was given an injection of Decadron in the emergency department and discharged with prednisone.  She was advised to follow-up with orthopedics.  All patient questions were answered.   ____________________________________________  FINAL CLINICAL IMPRESSION(S) / ED DIAGNOSES  Final diagnoses:  Right hip pain      NEW MEDICATIONS STARTED DURING THIS  VISIT:  ED Discharge Orders         Ordered    predniSONE (DELTASONE) 50 MG tablet     08/02/18 1654              This chart was dictated using voice recognition software/Dragon. Despite best efforts to proofread, errors can occur which can change the meaning. Any change was purely unintentional.    Lannie Fields, PA-C 08/02/18 1818    Carrie Mew, MD 08/04/18 0800

## 2018-08-02 NOTE — ED Notes (Signed)
Pt monitored after shot and is not having any complications.

## 2018-08-02 NOTE — ED Triage Notes (Signed)
Pt states that she has rt hip surgery in the past and it has been painful, pt states that she can feel the rod after they repaired it, pt sees Dr Percell Miller in [place]

## 2018-08-05 ENCOUNTER — Encounter: Payer: Self-pay | Admitting: Family Medicine

## 2018-08-05 ENCOUNTER — Other Ambulatory Visit: Payer: Self-pay | Admitting: Family Medicine

## 2018-08-05 DIAGNOSIS — M545 Low back pain, unspecified: Secondary | ICD-10-CM

## 2018-08-05 DIAGNOSIS — M25551 Pain in right hip: Secondary | ICD-10-CM

## 2018-08-05 DIAGNOSIS — M4802 Spinal stenosis, cervical region: Secondary | ICD-10-CM

## 2018-08-05 DIAGNOSIS — G8929 Other chronic pain: Secondary | ICD-10-CM

## 2018-08-05 MED ORDER — OXYCODONE HCL 5 MG PO TABS: 5.0000 mg | ORAL_TABLET | Freq: Four times a day (QID) | ORAL | 0 refills | Status: DC | PRN

## 2018-08-09 ENCOUNTER — Other Ambulatory Visit: Payer: Self-pay | Admitting: Family Medicine

## 2018-08-10 ENCOUNTER — Telehealth: Payer: Self-pay | Admitting: Family Medicine

## 2018-08-10 NOTE — Telephone Encounter (Signed)
Pt's daughter is calling and stating pt need a another referral to another pain management doctor, because she was late and they wouldn't see her. Please advise daughter

## 2018-08-10 NOTE — Telephone Encounter (Signed)
Please review,  I spoke with [name], and she states she referred her to Dr. Sharlet Salina and he declined, then pt was referred to [facility] but pt showed up late to her appt and due to this being her 3rd scheduled appt they will not see her. [name] stated if you want to refer her again it would be good to do a new referral

## 2018-08-10 NOTE — Telephone Encounter (Signed)
Please advise last filled by Dr. Benjie Karvonen on 05/18/18 qty 74

## 2018-08-11 ENCOUNTER — Other Ambulatory Visit: Payer: Self-pay

## 2018-08-11 ENCOUNTER — Other Ambulatory Visit: Payer: Self-pay | Admitting: Family Medicine

## 2018-08-11 ENCOUNTER — Ambulatory Visit (INDEPENDENT_AMBULATORY_CARE_PROVIDER_SITE_OTHER)
Admission: RE | Admit: 2018-08-11 | Discharge: 2018-08-11 | Disposition: A | Discharge status: Home / Self Care | Payer: Medicare Other | Source: Ambulatory Visit | Attending: Family Medicine | Admitting: Family Medicine

## 2018-08-11 ENCOUNTER — Encounter: Payer: Self-pay | Admitting: *Deleted

## 2018-08-11 ENCOUNTER — Ambulatory Visit (INDEPENDENT_AMBULATORY_CARE_PROVIDER_SITE_OTHER): Payer: Medicare Other | Admitting: Family Medicine

## 2018-08-11 ENCOUNTER — Encounter: Payer: Self-pay | Admitting: Family Medicine

## 2018-08-11 VITALS — BP 122/70 | HR 77 | Temp 98.5°F | Ht 64.0 in | Wt 153.8 lb

## 2018-08-11 DIAGNOSIS — G8929 Other chronic pain: Secondary | ICD-10-CM

## 2018-08-11 DIAGNOSIS — M25511 Pain in right shoulder: Secondary | ICD-10-CM

## 2018-08-11 DIAGNOSIS — M25551 Pain in right hip: Secondary | ICD-10-CM

## 2018-08-11 DIAGNOSIS — M4802 Spinal stenosis, cervical region: Secondary | ICD-10-CM

## 2018-08-11 DIAGNOSIS — R509 Fever, unspecified: Secondary | ICD-10-CM | POA: Diagnosis not present

## 2018-08-11 DIAGNOSIS — M25561 Pain in right knee: Secondary | ICD-10-CM

## 2018-08-11 DIAGNOSIS — M25512 Pain in left shoulder: Secondary | ICD-10-CM

## 2018-08-11 DIAGNOSIS — J441 Chronic obstructive pulmonary disease with (acute) exacerbation: Secondary | ICD-10-CM | POA: Diagnosis not present

## 2018-08-11 DIAGNOSIS — M545 Low back pain, unspecified: Secondary | ICD-10-CM

## 2018-08-11 LAB — POC INFLUENZA A&B (BINAX/QUICKVUE)
Influenza A, POC: NEGATIVE
Influenza B, POC: NEGATIVE

## 2018-08-11 MED ORDER — PREDNISONE 20 MG PO TABS: ORAL_TABLET | ORAL | 0 refills | Status: DC

## 2018-08-11 NOTE — Progress Notes (Signed)
Order placed for referral to pain management. See telephone note.

## 2018-08-11 NOTE — Telephone Encounter (Signed)
I have placed a new referral.

## 2018-08-11 NOTE — Patient Instructions (Addendum)
Rest, increase fluids. Can use mucinex DM for cough.  Use albuterol as needed for wheeze.  Start prednisone taper.  If shortness of breathworsening.Marland Kitchen go to ER.

## 2018-08-12 ENCOUNTER — Encounter: Payer: Self-pay | Admitting: Family Medicine

## 2018-08-12 DIAGNOSIS — J849 Interstitial pulmonary disease, unspecified: Secondary | ICD-10-CM | POA: Insufficient documentation

## 2018-08-13 ENCOUNTER — Encounter: Payer: Self-pay | Admitting: Family Medicine

## 2018-08-17 ENCOUNTER — Other Ambulatory Visit: Payer: Self-pay | Admitting: Family Medicine

## 2018-08-17 ENCOUNTER — Encounter: Payer: Self-pay | Admitting: Family Medicine

## 2018-08-17 DIAGNOSIS — M25551 Pain in right hip: Secondary | ICD-10-CM

## 2018-08-17 DIAGNOSIS — M545 Low back pain, unspecified: Secondary | ICD-10-CM

## 2018-08-17 DIAGNOSIS — G8929 Other chronic pain: Secondary | ICD-10-CM

## 2018-08-17 DIAGNOSIS — M4802 Spinal stenosis, cervical region: Secondary | ICD-10-CM

## 2018-08-17 MED ORDER — OXYCODONE HCL 5 MG PO TABS: 5.0000 mg | ORAL_TABLET | Freq: Four times a day (QID) | ORAL | 0 refills | Status: DC | PRN

## 2018-08-19 NOTE — Assessment & Plan Note (Signed)
Rest, increase fluids. Can use mucinex DM for cough.  Use albuterol as needed for wheeze.  Start prednisone taper.  If shortness of breathworsening.Marland Kitchen go to ER.

## 2018-08-19 NOTE — Progress Notes (Signed)
Patient ID: [name], female   DOB: 1940/09/23, 78 y.o.   MRN: [id number] Cough  This is a new problem. The current episode started yesterday. The problem has been gradually worsening. The cough is productive of sputum. Associated symptoms include a fever, headaches, myalgias and shortness of breath. Pertinent negatives include no chills, ear congestion, ear pain, hemoptysis, nasal congestion, sore throat or wheezing. Associated symptoms comments: 100.1 F   fatigued. Risk factors: nonsmoker. She has tried a beta-agonist inhaler (tyelnol) for the symptoms. The treatment provided mild relief. Her past medical history is significant for COPD. There is no history of environmental allergies.  Headache   Associated symptoms include coughing and a fever. Pertinent negatives include no ear pain or sore throat.   On flovent, Albuterol as needed  Hx of COPD  Hx of PE in 12/20219  [place] [facility]NA in 03/2018 She returned to baseline. No longer on oxygen  ED visit on 3/8 for hip pain  Social History /Family History/Past Medical History reviewed in detail and updated in EMR if needed. Blood pressure 122/70, pulse 77, temperature 98.5 F (36.9 C), height 5\' 4"  (1.626 m), weight 153 lb 12.8 oz (69.8 kg), SpO2 96 %.   Review of Systems  Constitutional: Positive for fever. Negative for chills.  HENT: Negative for ear pain and sore throat.   Respiratory: Positive for cough and shortness of breath. Negative for hemoptysis and wheezing.   Musculoskeletal: Positive for myalgias.  Allergic/Immunologic: Negative for environmental allergies.  Neurological: Positive for headaches.       Objective:   Objective Physical Exam  Constitutional:  General: She is not in acute distress. Appearance: She is well-developed. She is not ill-appearing or toxic-appearing.  Comments: Sleepy appearing, fell asleep, was arousable while MD was talking with daguhter  HENT:  Head: Normocephalic.  Right Ear: Hearing,  tympanic membrane, ear canal and external ear normal. Tympanic membrane is not erythematous, retracted or bulging.  Left Ear: Hearing, tympanic membrane, ear canal and external ear normal. Tympanic membrane is not erythematous, retracted or bulging.  Nose: Mucosal edema and rhinorrhea present.  Right Sinus: No maxillary sinus tenderness or frontal sinus tenderness.  Left Sinus: No maxillary sinus tenderness or frontal sinus tenderness.  Mouth/Throat:  Pharynx: Uvula midline.  Eyes:  General: Lids are normal. Lids are everted, no foreign bodies appreciated.  Conjunctiva/sclera: Conjunctivae normal.  Pupils: Pupils are equal, round, and reactive to light.  Neck:  Musculoskeletal: Normal range of motion and neck supple.  Thyroid: No thyroid mass or thyromegaly.  Vascular: No carotid bruit.  Trachea: Trachea normal.  Cardiovascular:  Rate and Rhythm: Normal rate and regular rhythm.  Pulses: Normal pulses.  Heart sounds: Normal heart sounds, S1 normal and S2 normal. No murmur. No friction rub. No gallop.  Pulmonary:  Effort: Pulmonary effort is normal. No tachypnea or respiratory distress.  Breath sounds: Examination of the right-middle field reveals rales. Examination of the right-lower field reveals rales. Decreased breath sounds and rales present. No wheezing or rhonchi.  Skin:  General: Skin is warm and dry.  Findings: No rash.  Neurological:  Mental Status: She is alert.  Psychiatric:  Mood and Affect: Mood is not anxious or depressed.  Speech: Speech normal.  Behavior: Behavior normal. Behavior is cooperative.  Judgment: Judgment normal.

## 2018-08-24 IMAGING — CR DG RIBS W/ CHEST 3+V*L*
4 series · 4 of 4 positions shown · non-contrast
Comparison: Prior chest radiographs.  Chest CTA, 10/23/2016

CLINICAL DATA: Bilateral rib pain status post a fall 2 months ago.

EXAM:
LEFT RIBS AND CHEST - 3+ VIEW

[rib ap (1 of 2)]
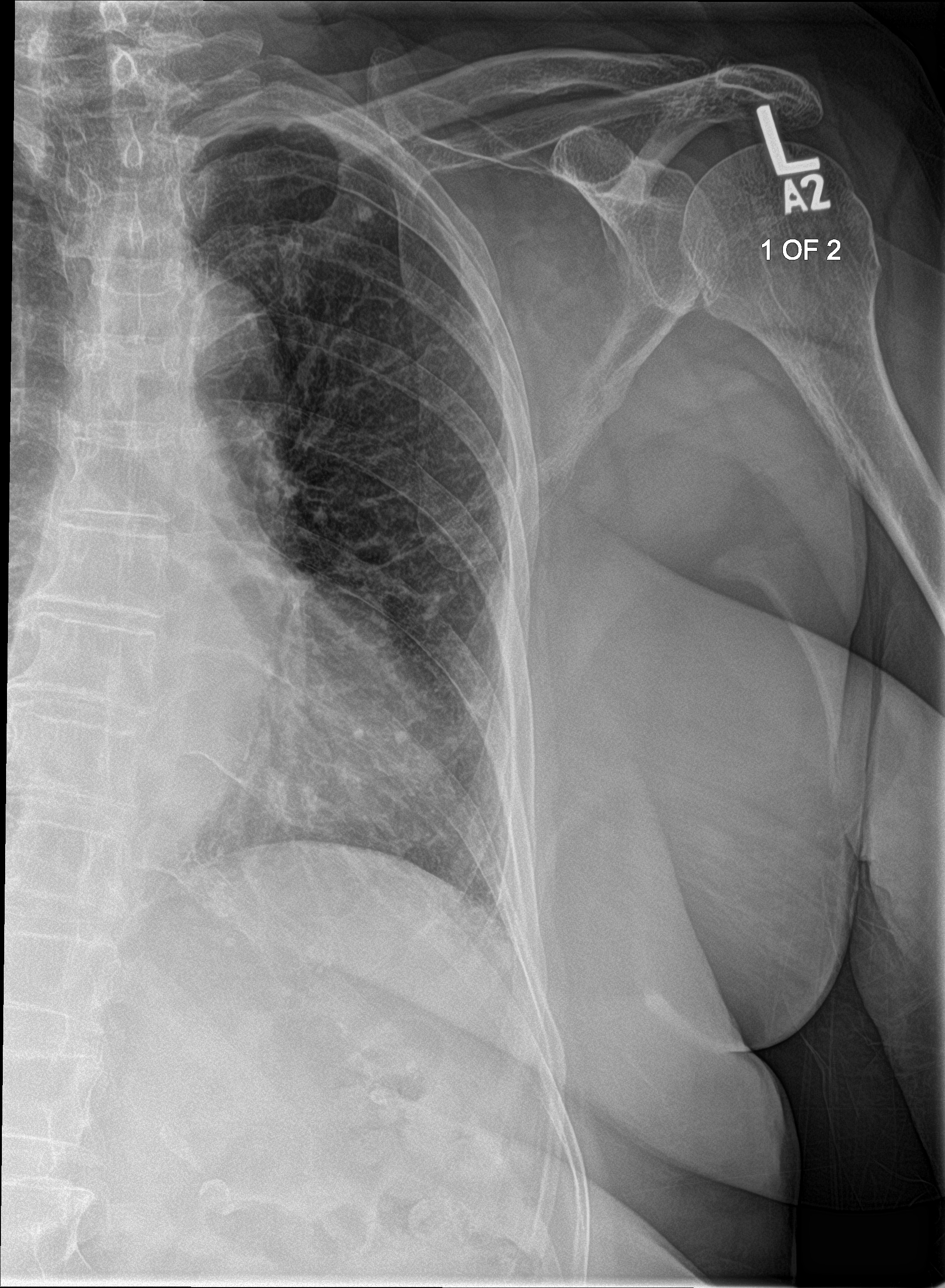

[rib ap obl (1 of 2)]
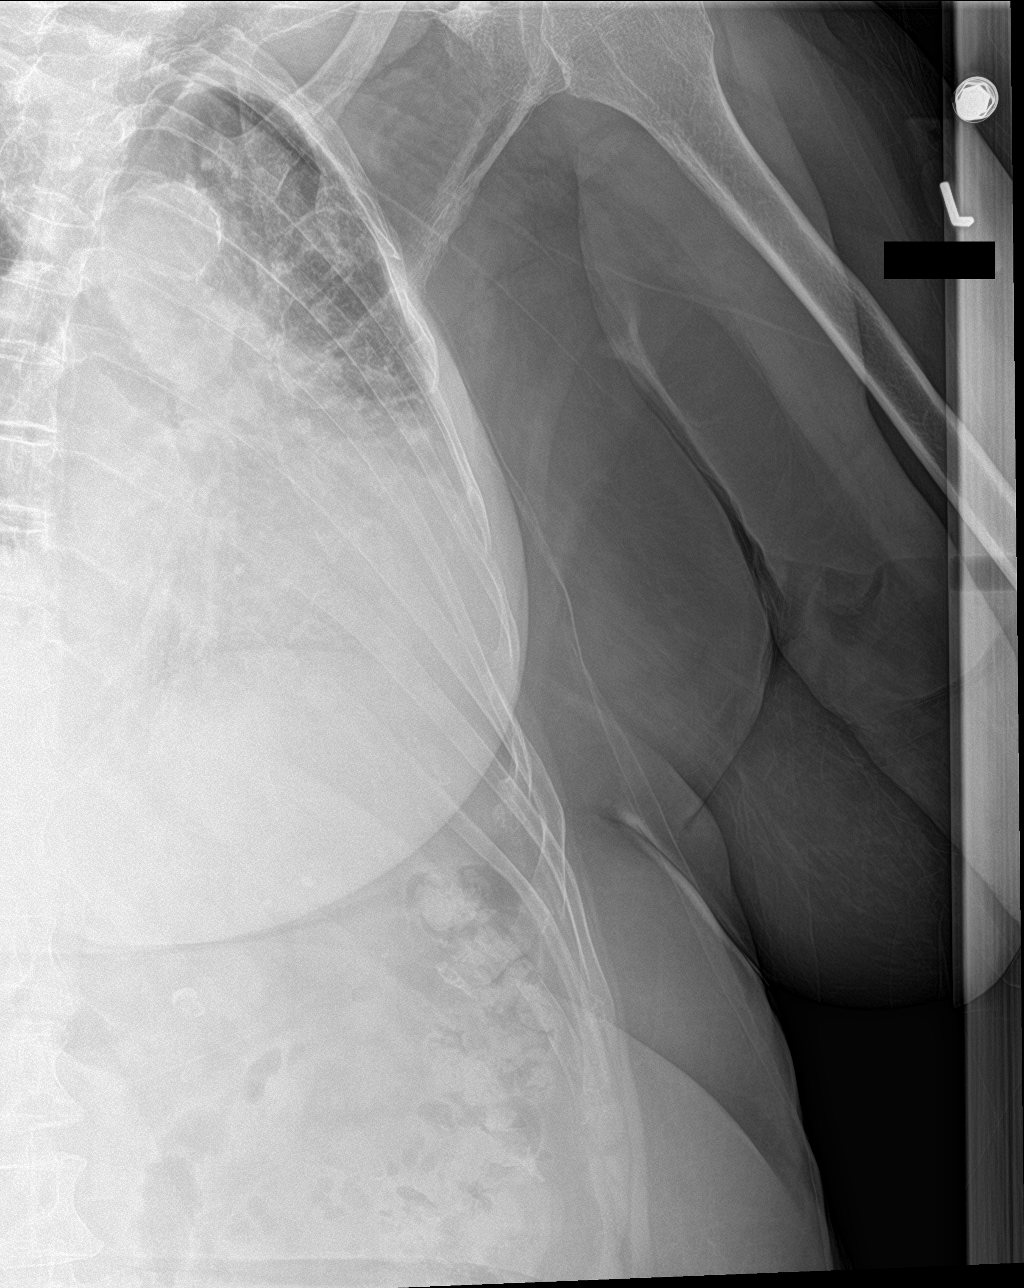

[rib ap obl (2 of 2)]
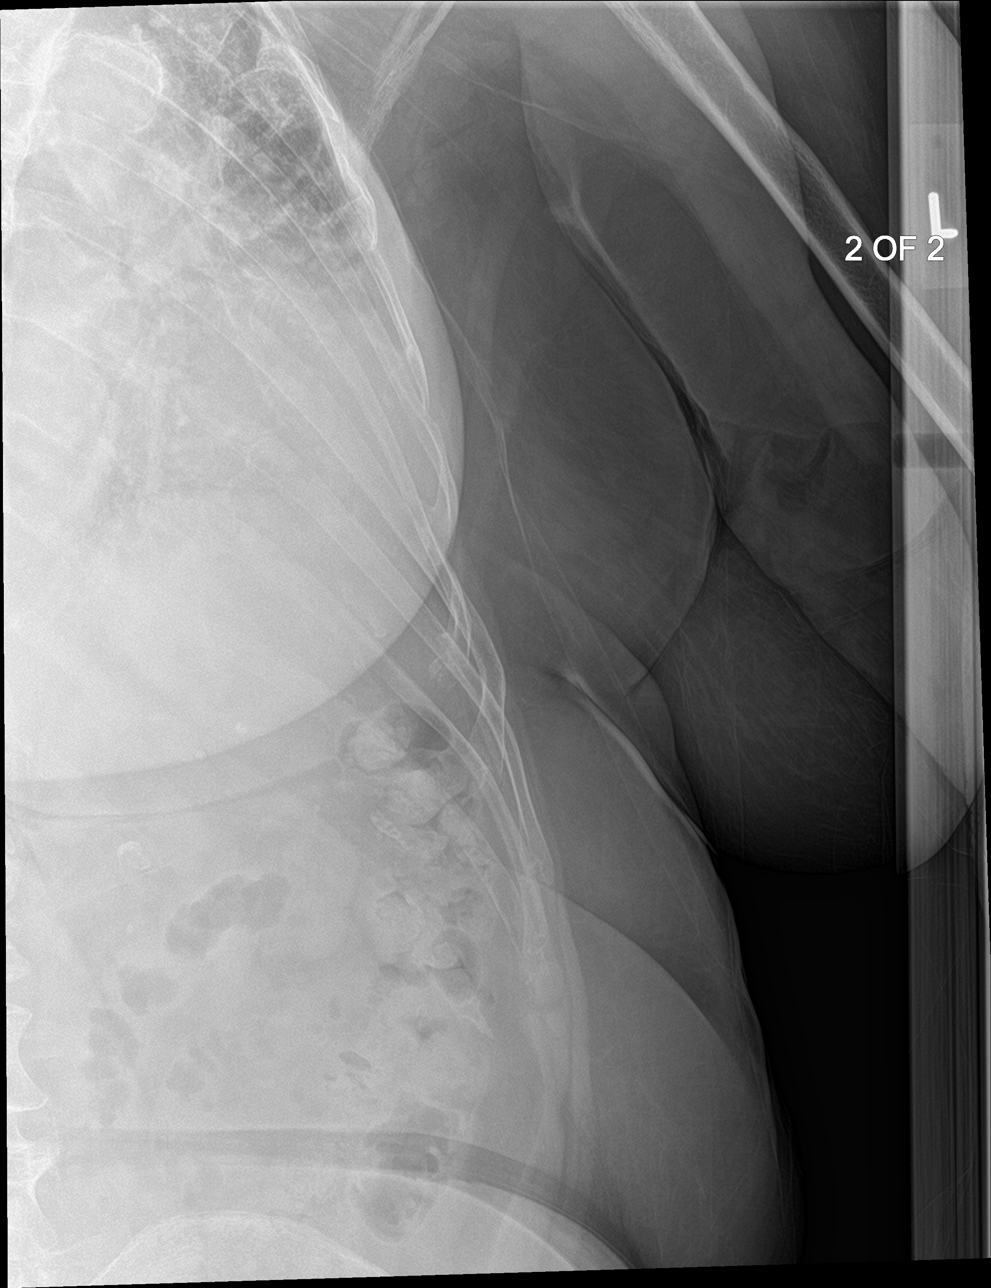

[rib ap (2 of 2)]
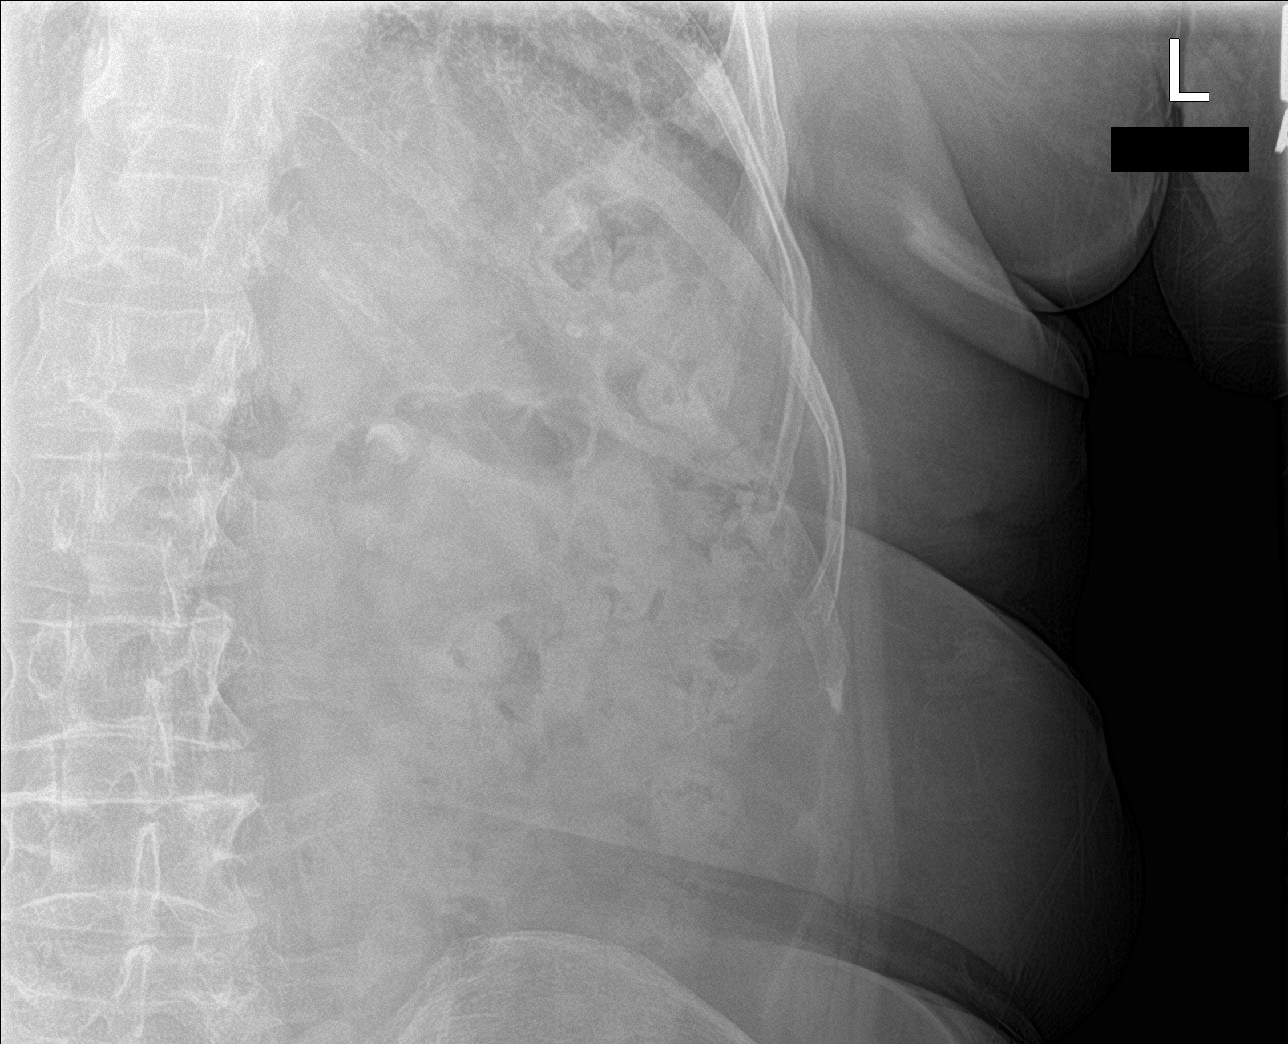

[4 of 4 positions shown; findings below may reference images not displayed]

FINDINGS: No rib fracture or rib lesion.  Bones are demineralized.
IMPRESSION: Negative.

## 2018-08-24 IMAGING — CR DG KNEE 1-2V*R*
2 series · 2 of 2 positions shown · non-contrast
Comparison: 09/27/2016

CLINICAL DATA: Patient reports right knee pain that is worse when
attempting to flex knee. Patient has had multiple right knee
surgeries. No new or recent injuries.

EXAM:
RIGHT KNEE - 1-2 VIEW

[knee ap]
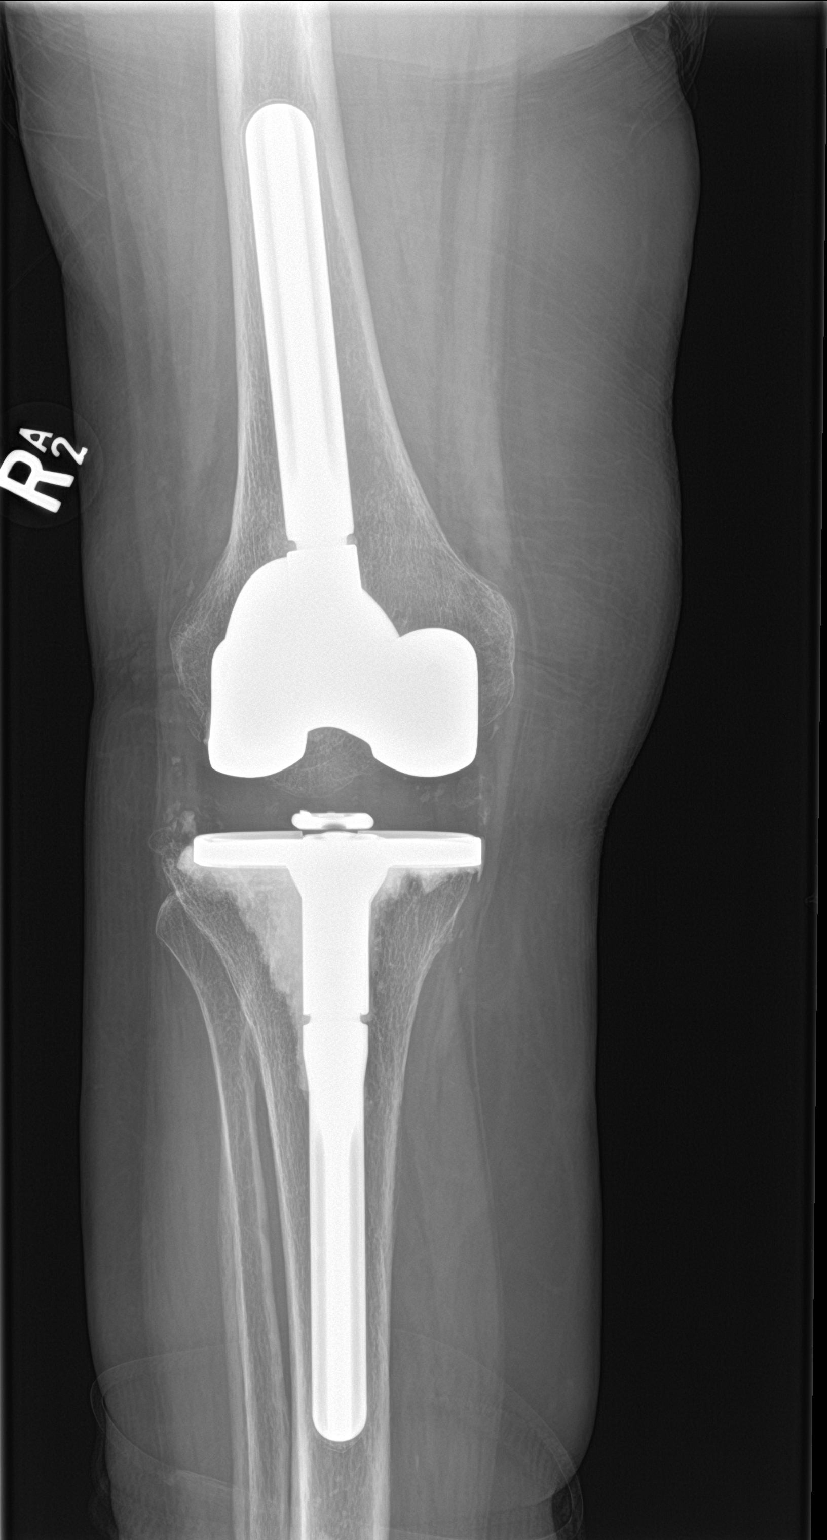

[knee lat]
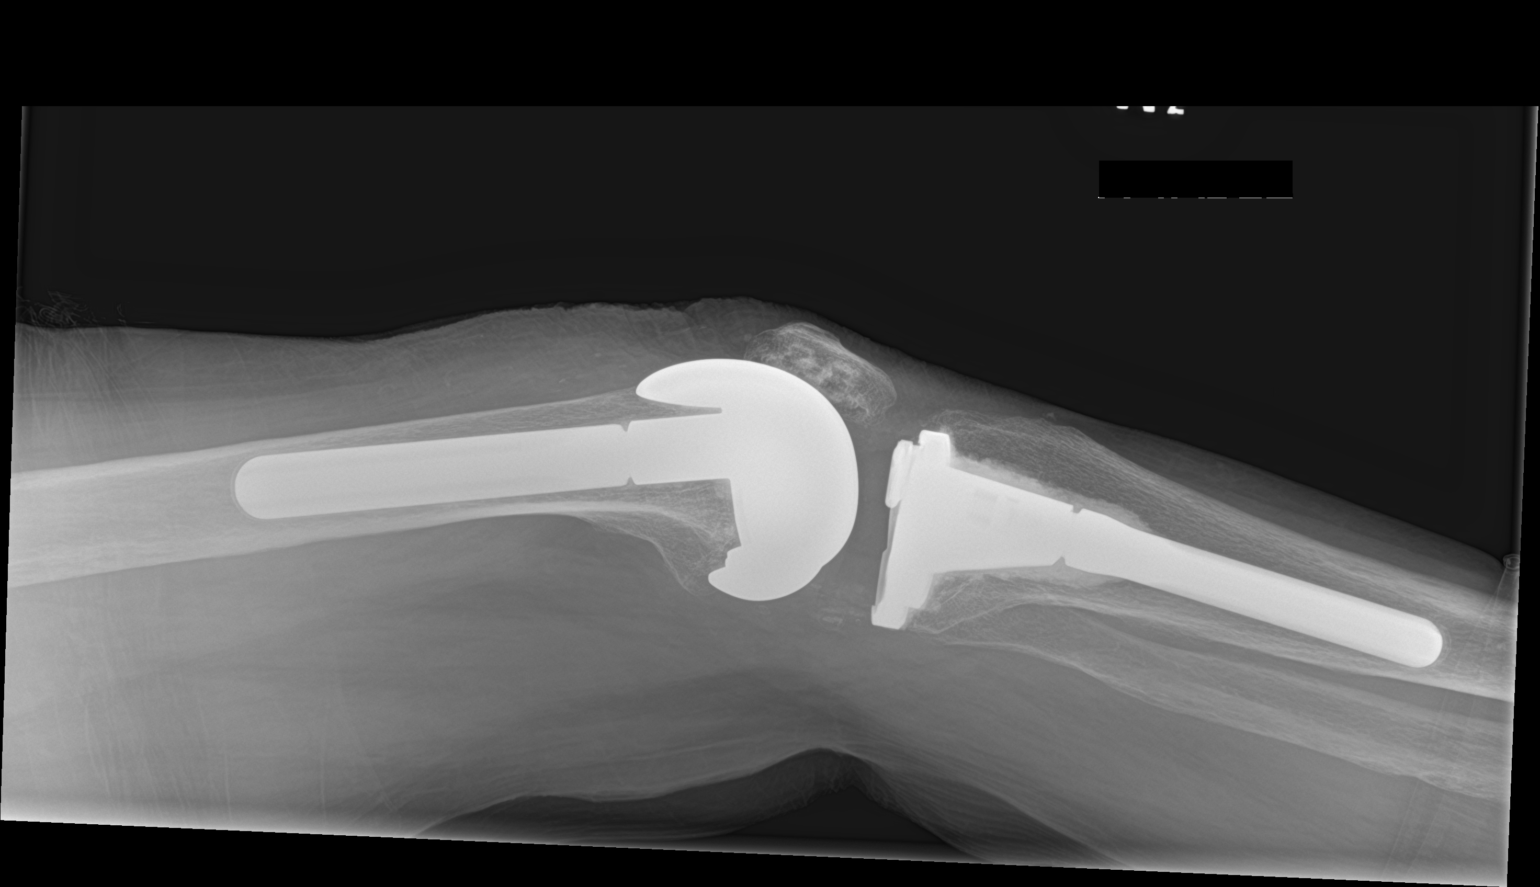

[2 of 2 positions shown; findings below may reference images not displayed]

FINDINGS: No fracture or bone lesion.

The femoral and tibial prosthetic components are well-seated with no
evidence of loosening.

No joint effusion.

Soft tissues are unremarkable.

Bones are demineralized.
IMPRESSION: 1. No fracture, bone lesion or evidence of loosening of the
orthopedic hardware.

## 2018-08-24 IMAGING — CR DG RIBS W/ CHEST 3+V*R*
5 series · 5 of 5 positions shown · non-contrast
Comparison: Chest CT, 10/23/2016.

CLINICAL DATA: Reported fall 2 months ago. Bilateral posterior rib
pain.

EXAM:
RIGHT RIBS AND CHEST - 3+ VIEW

[chest pa]
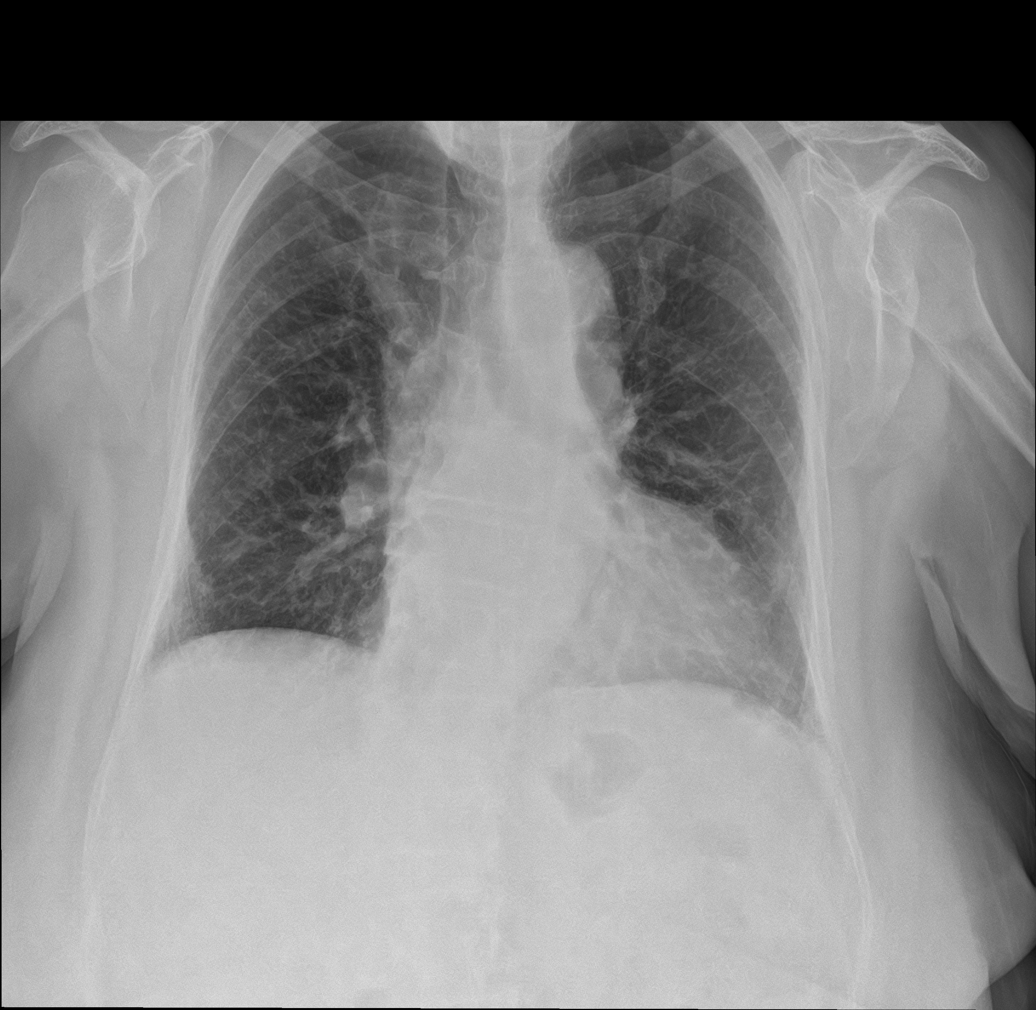

[rib ap (1 of 2)]
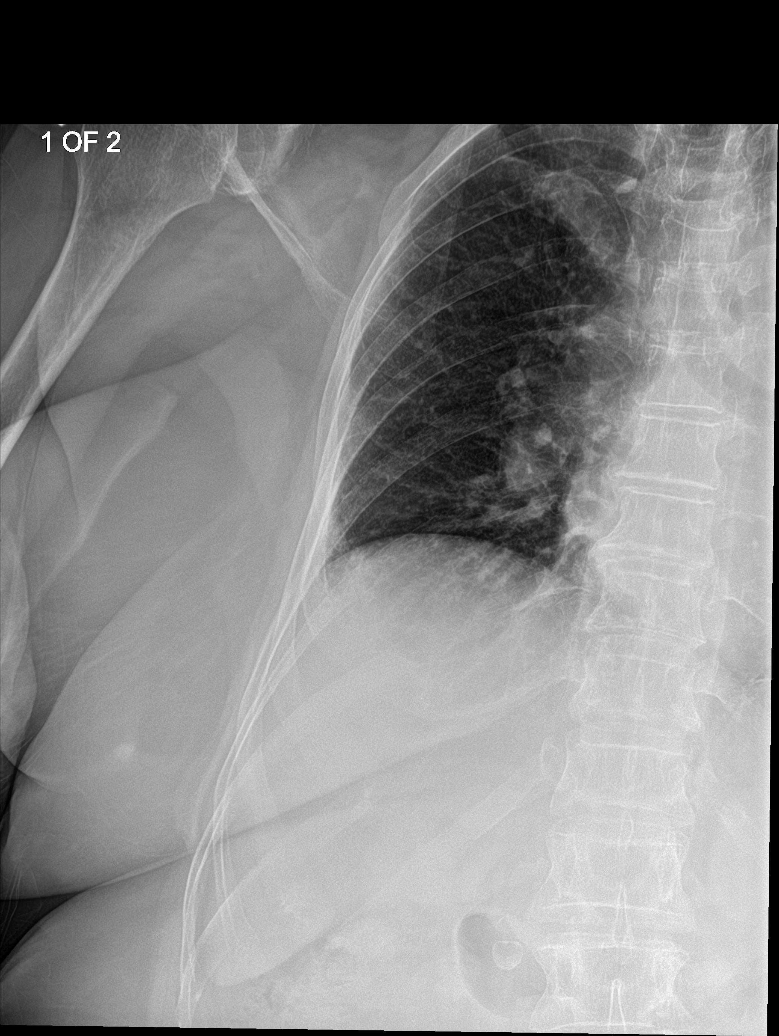

[rib ap obl (1 of 2)]
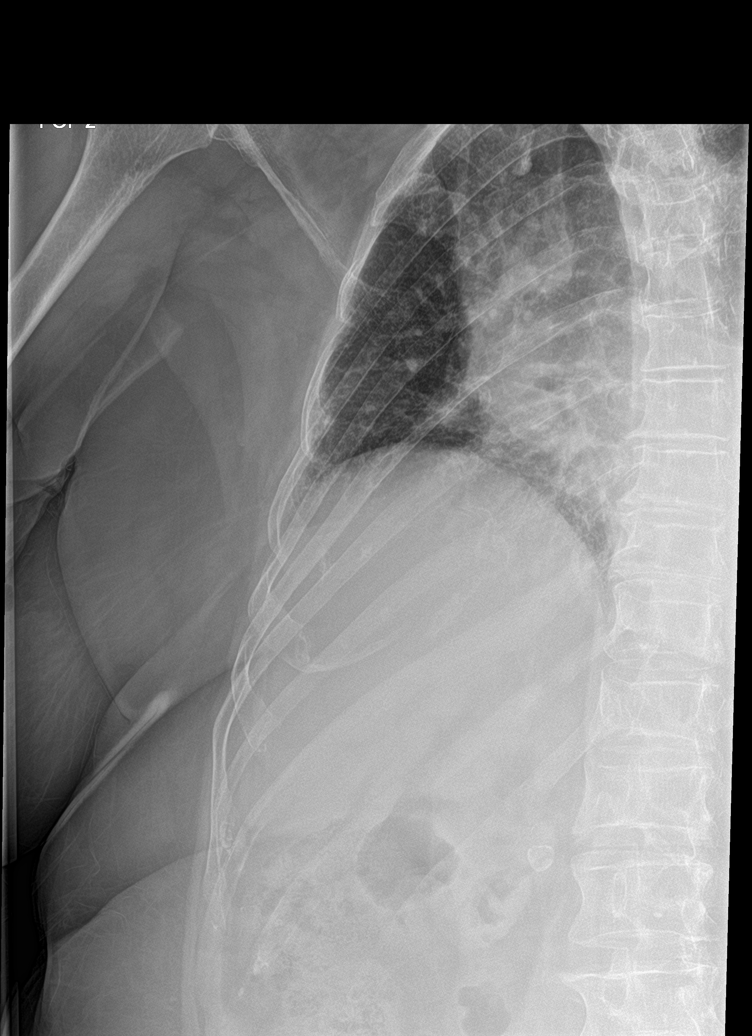

[rib ap (2 of 2)]
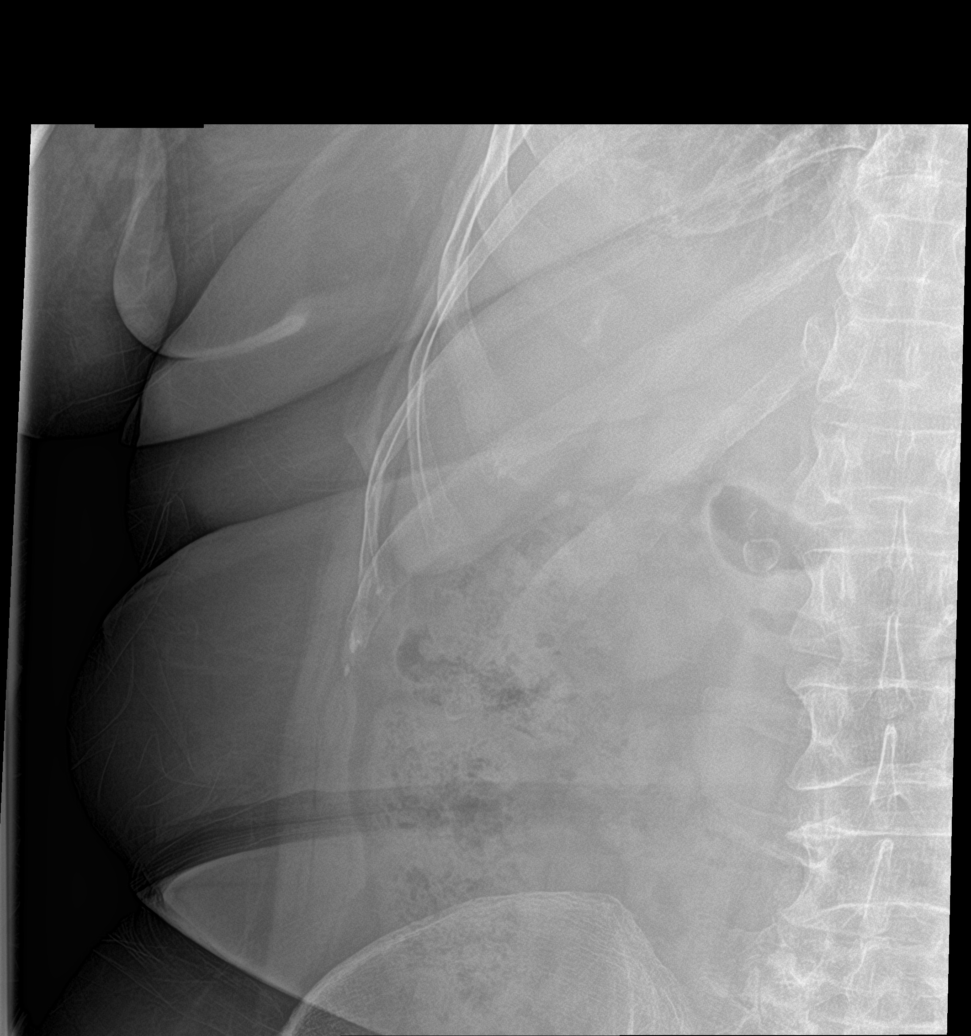

[rib ap obl (2 of 2)]
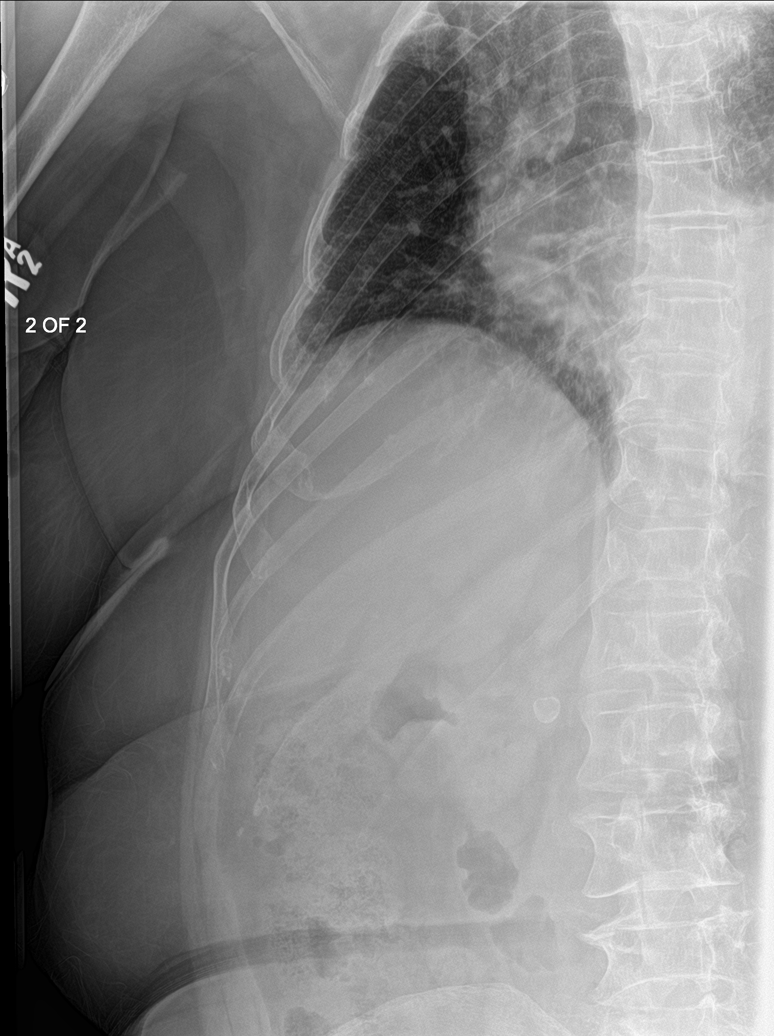

[5 of 5 positions shown; findings below may reference images not displayed]

FINDINGS: No convincing rib fracture. No rib lesion. The apparent fracture of
the posterior right twelfth rib adjacent to the costovertebral
joint, noted on the lumbar spine radiographs, is not defined on the
rib images.

Bones are demineralized.

The cardiac silhouette is mildly enlarged. No mediastinal or hilar
masses. No acute findings in the lungs. No pleural effusion or
pneumothorax.
IMPRESSION: 1. No convincing rib fracture or rib lesion.
2. No acute cardiopulmonary disease.

## 2018-08-24 IMAGING — CR DG LUMBAR SPINE COMPLETE 4+V
5 series · 5 of 5 positions shown · non-contrast
Comparison: CT, 10/23/2016

CLINICAL DATA: Low back pain for many years.  No specific injury.

EXAM:
LUMBAR SPINE - COMPLETE 4+ VIEW

[l-spine ap]
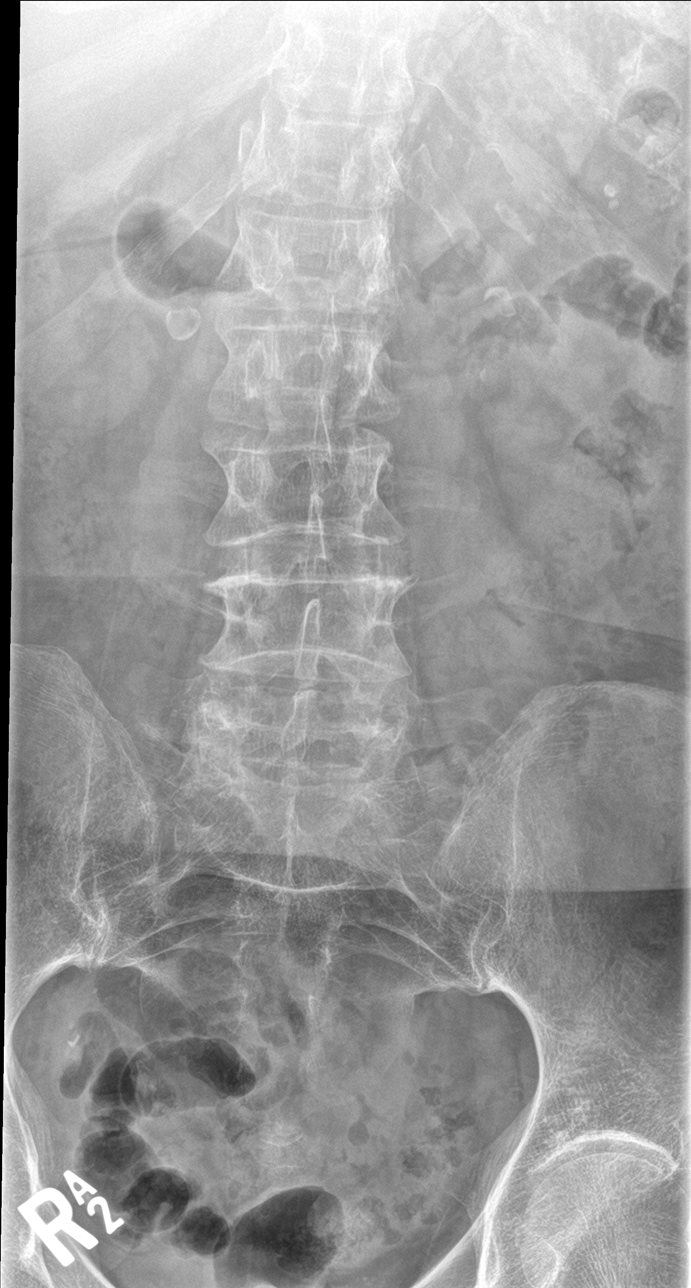

[l-spine obl (1 of 2)]
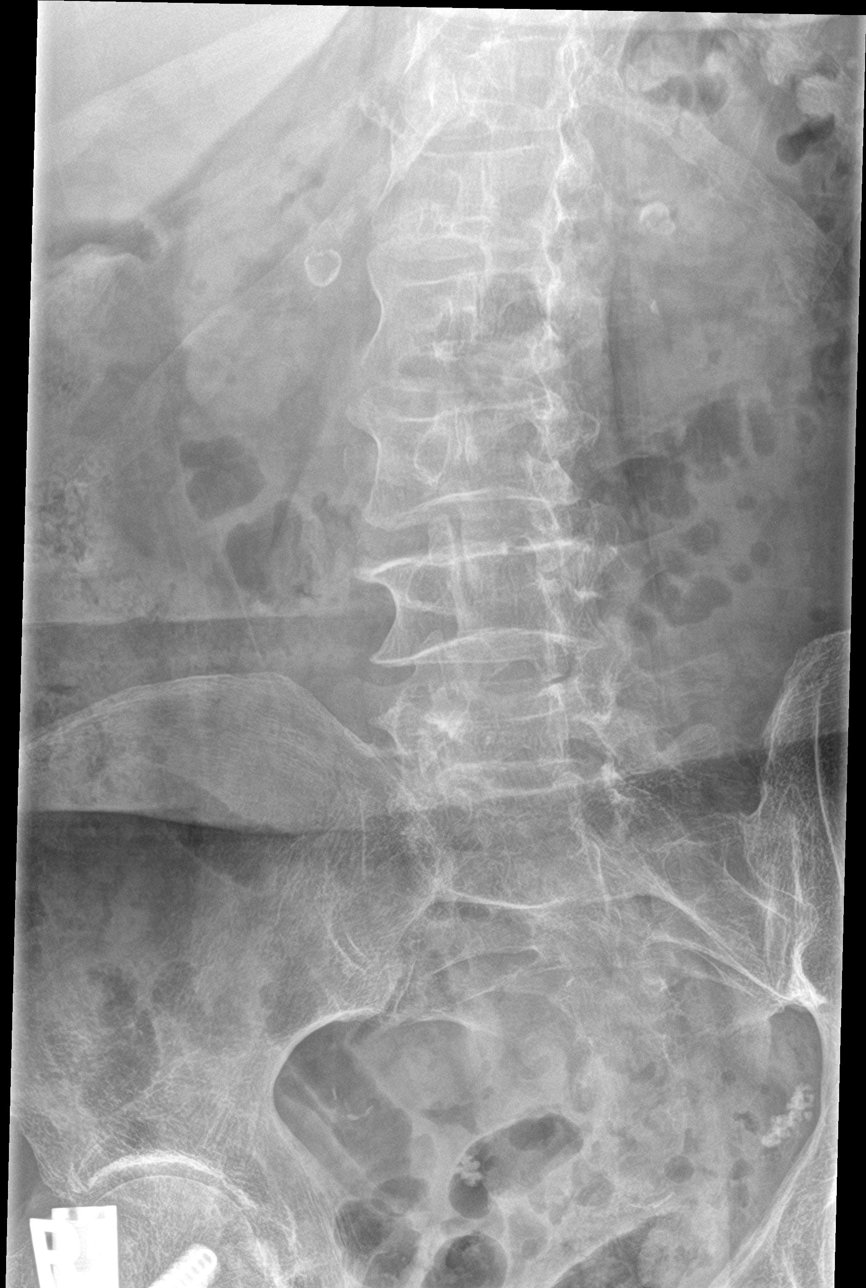

[l-spine obl (2 of 2)]
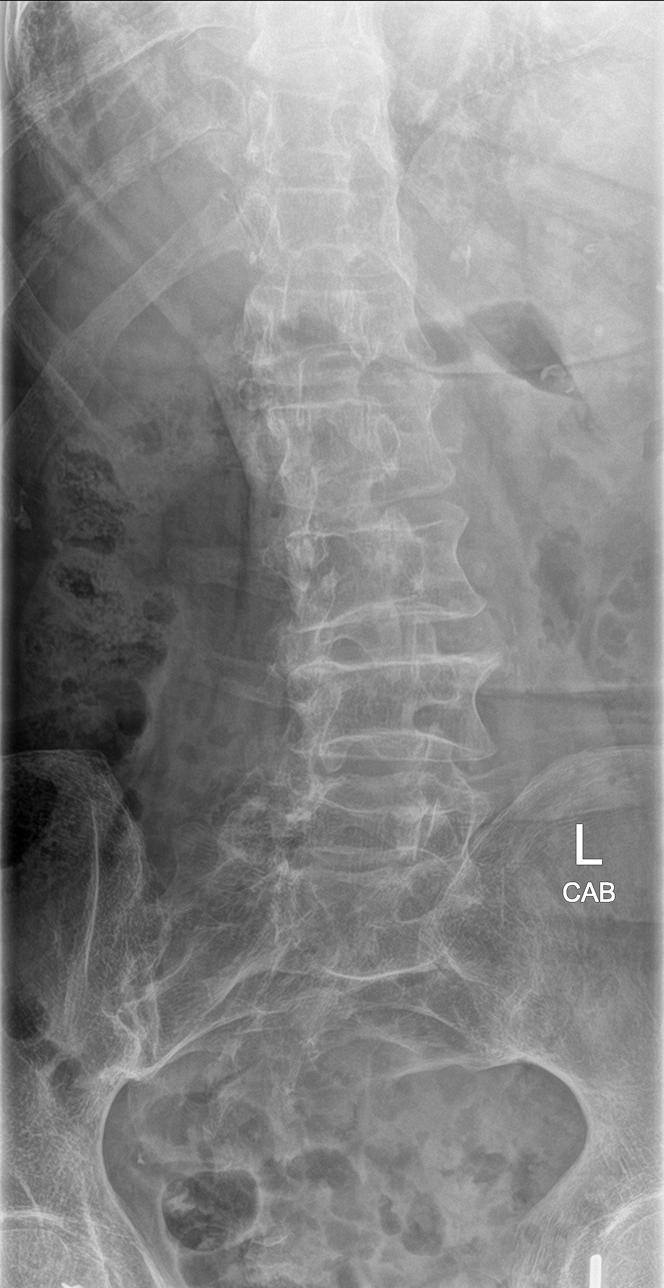

[l-spine lat]
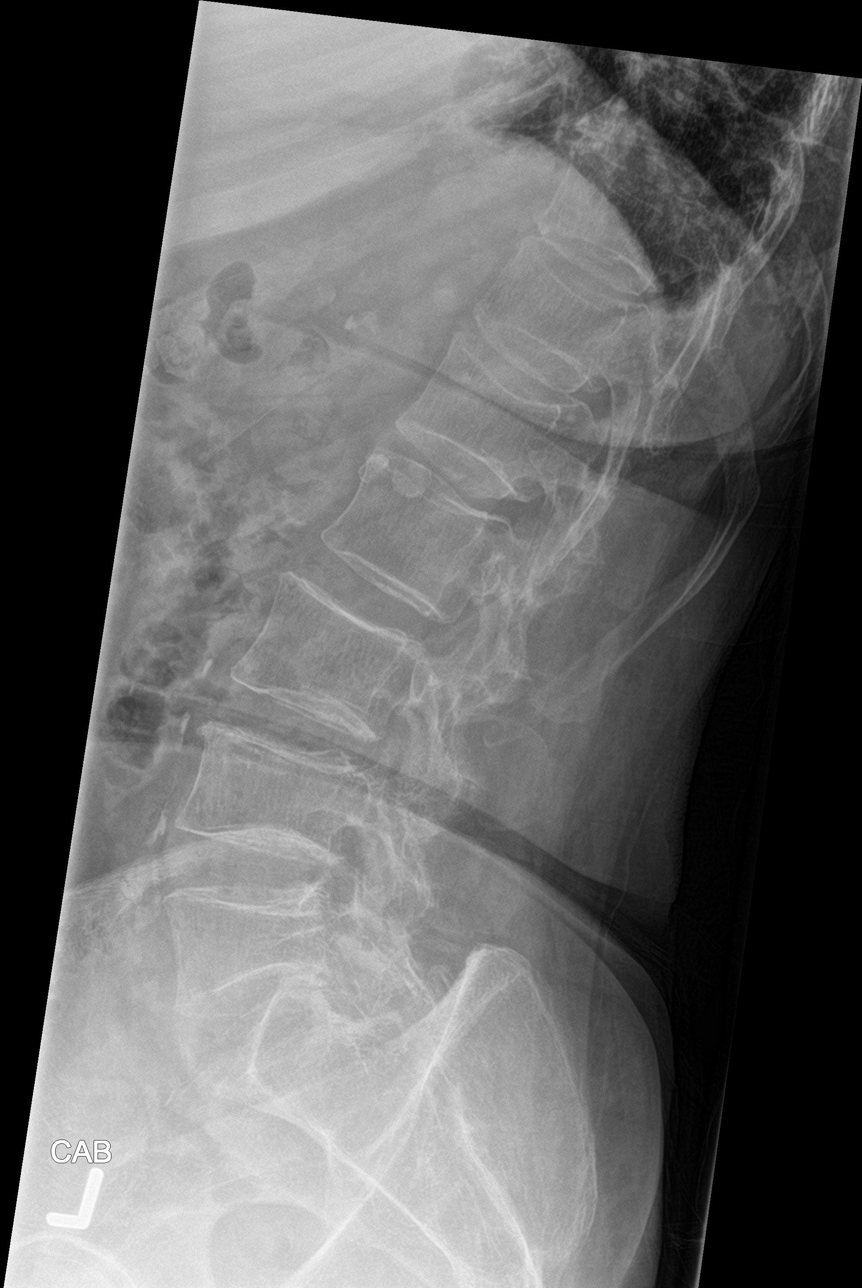

[l-spine spot]
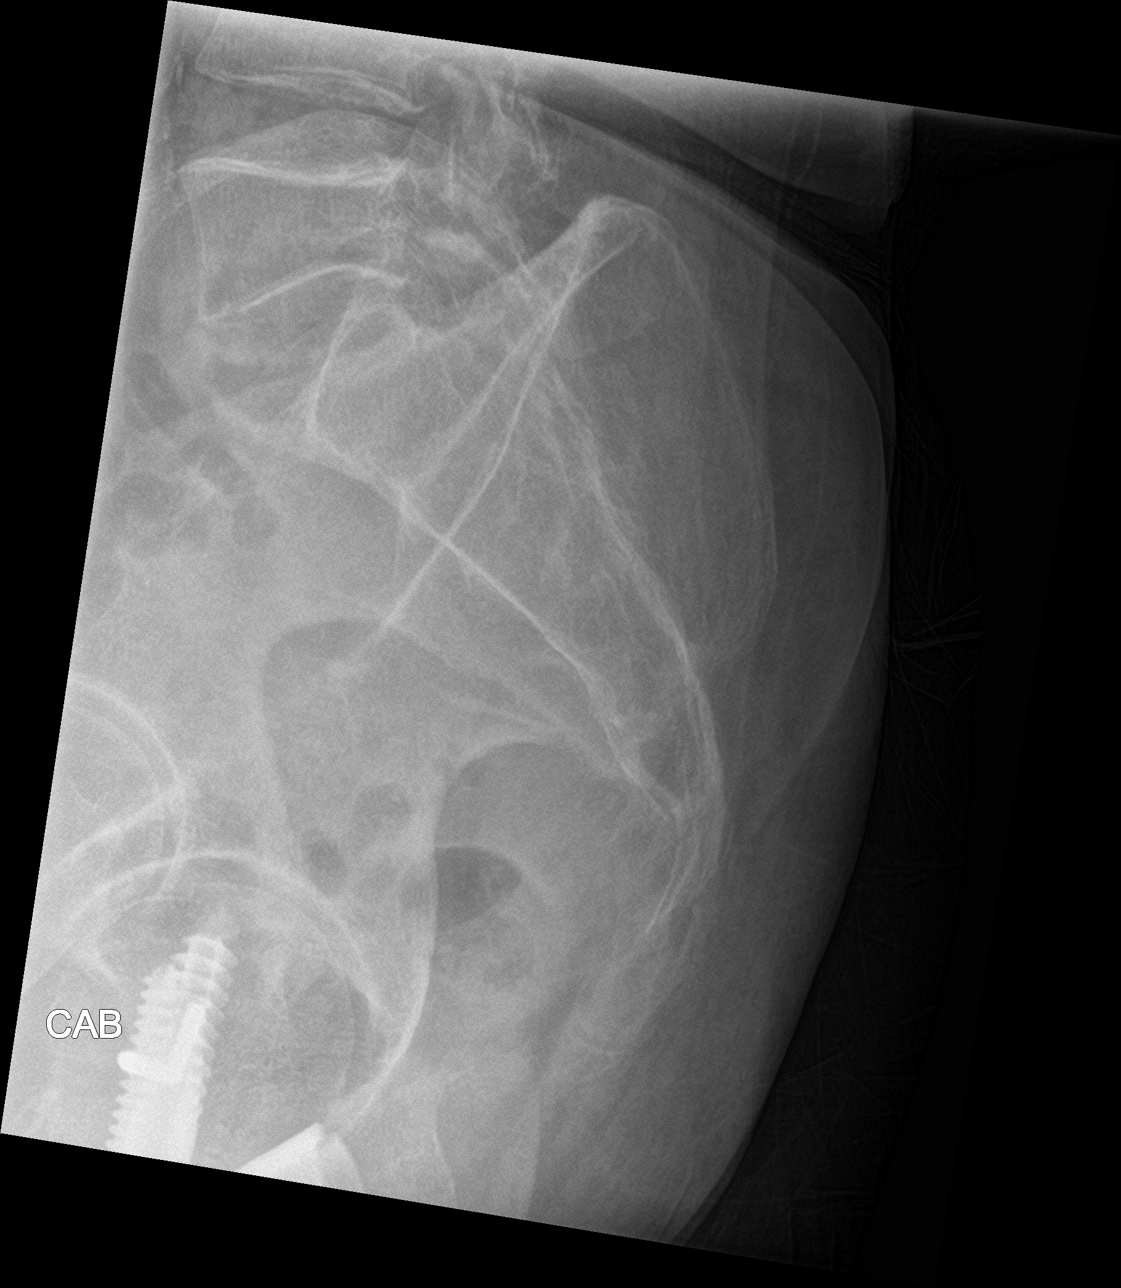

[5 of 5 positions shown; findings below may reference images not displayed]

FINDINGS: There are fractures of posterior twelfth ribs bilaterally, adjacent
to the costovertebral junction. These may be recent. These were not
evident on the prior chest CT.

No other fractures. All lumbar vertebral bodies normal in height.
Lumbar disc spaces are well maintained. No significant degenerative
change. Facet joints are well maintained.

Bones are demineralized. There are scattered inferior abdominal
aortic vascular calcifications. Soft tissues otherwise unremarkable.
IMPRESSION: 1. Apparent fractures of the twelfth ribs bilaterally adjacent to
the costovertebral joints. These could be from the fall reported few
months ago.
2. No other evidence of a fracture or acute abnormality.

## 2018-08-24 IMAGING — CR DG HIP (WITH OR WITHOUT PELVIS) 2-3V*R*
3 series · 3 of 3 positions shown · non-contrast
Comparison: 10/22/2016

CLINICAL DATA: Patient reports she fell and broke right hip x2
months ago. Reports no relief of pain since operation.

EXAM:
DG HIP (WITH OR WITHOUT PELVIS) 2-3V RIGHT

[pelvis ap]
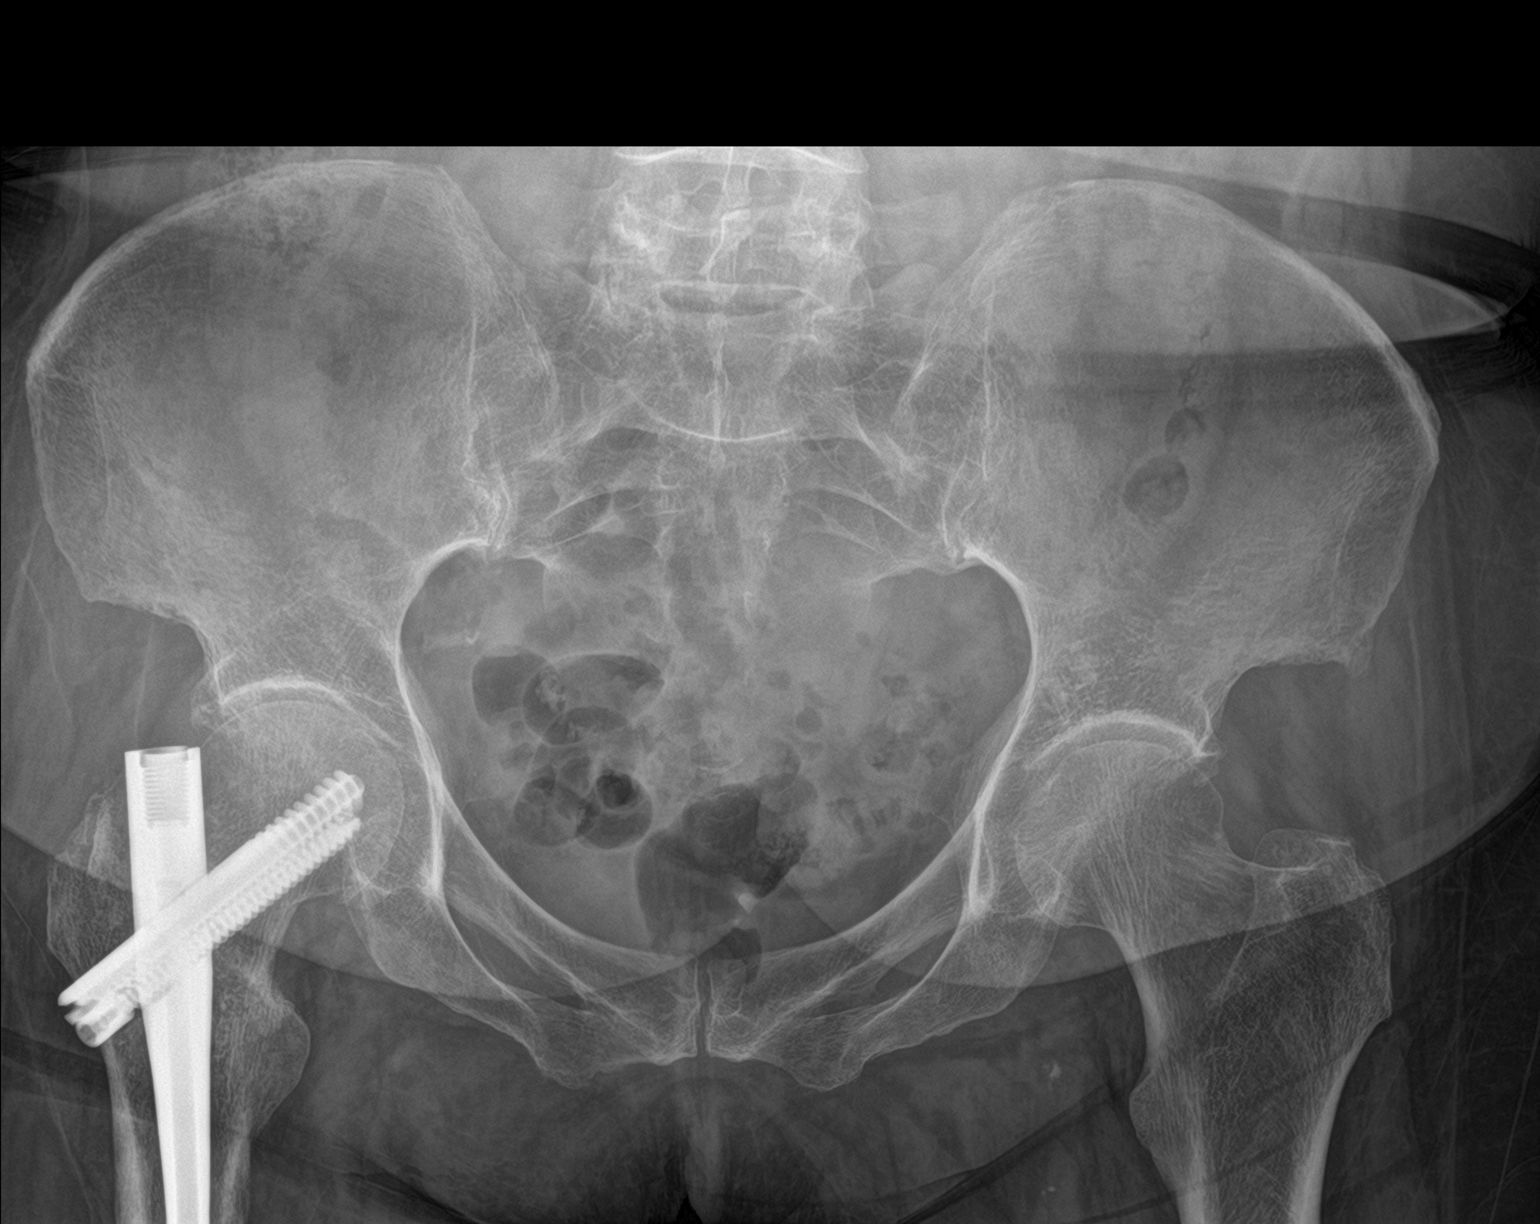

[hip ap]
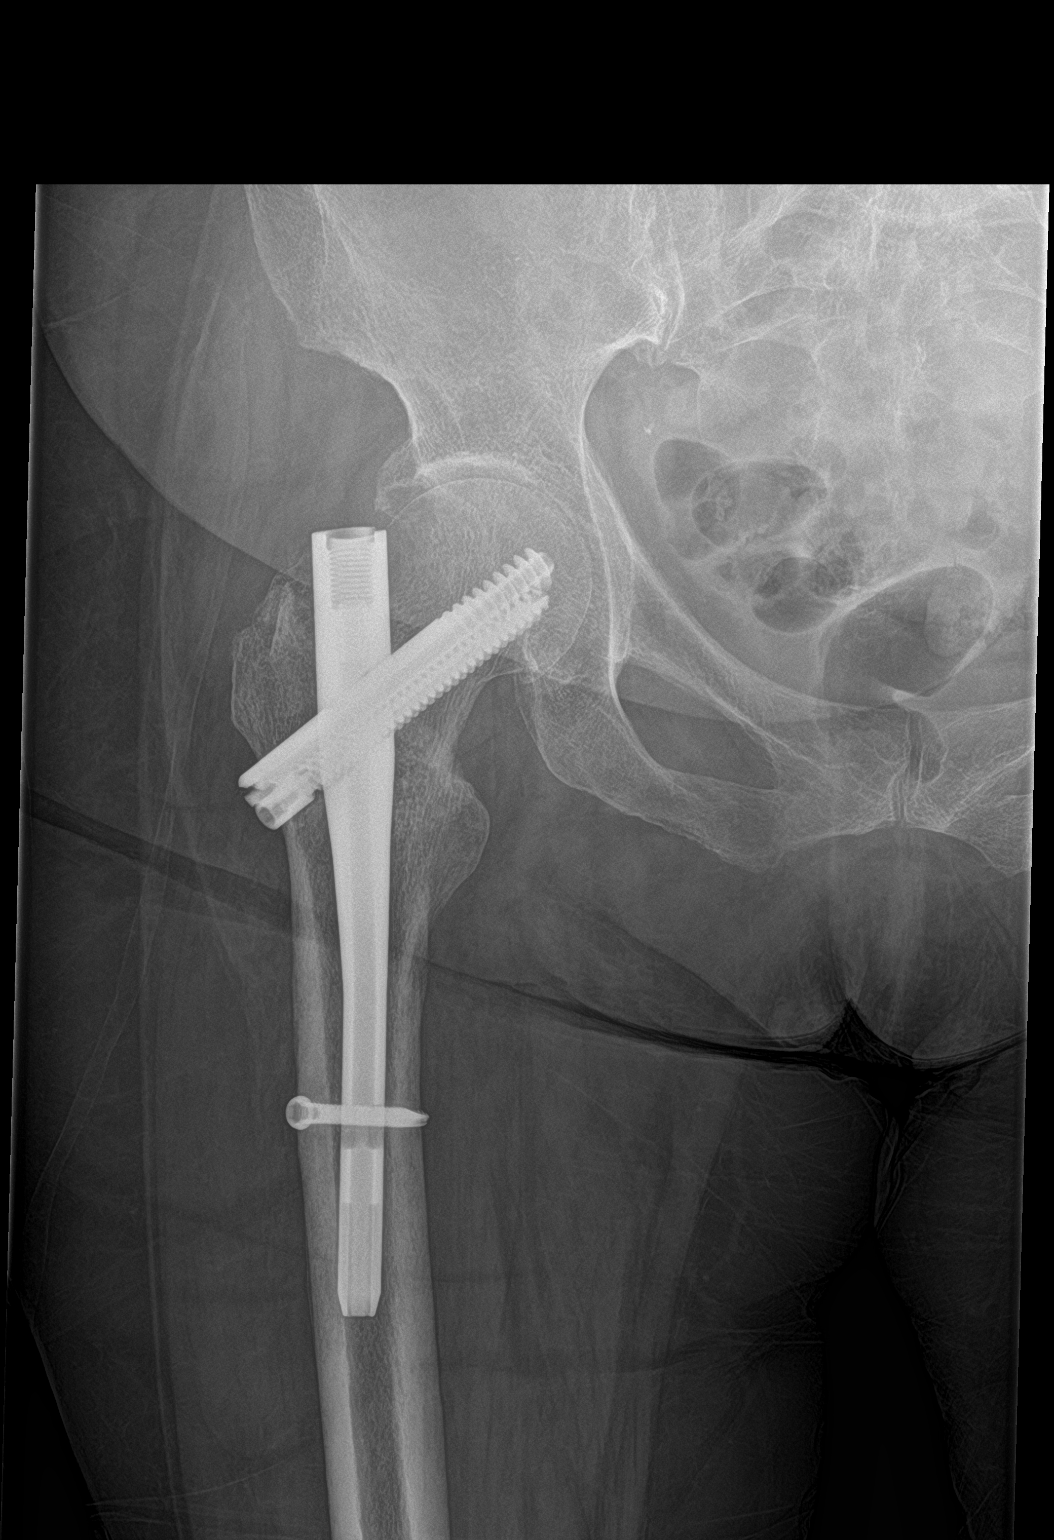

[hip lat]
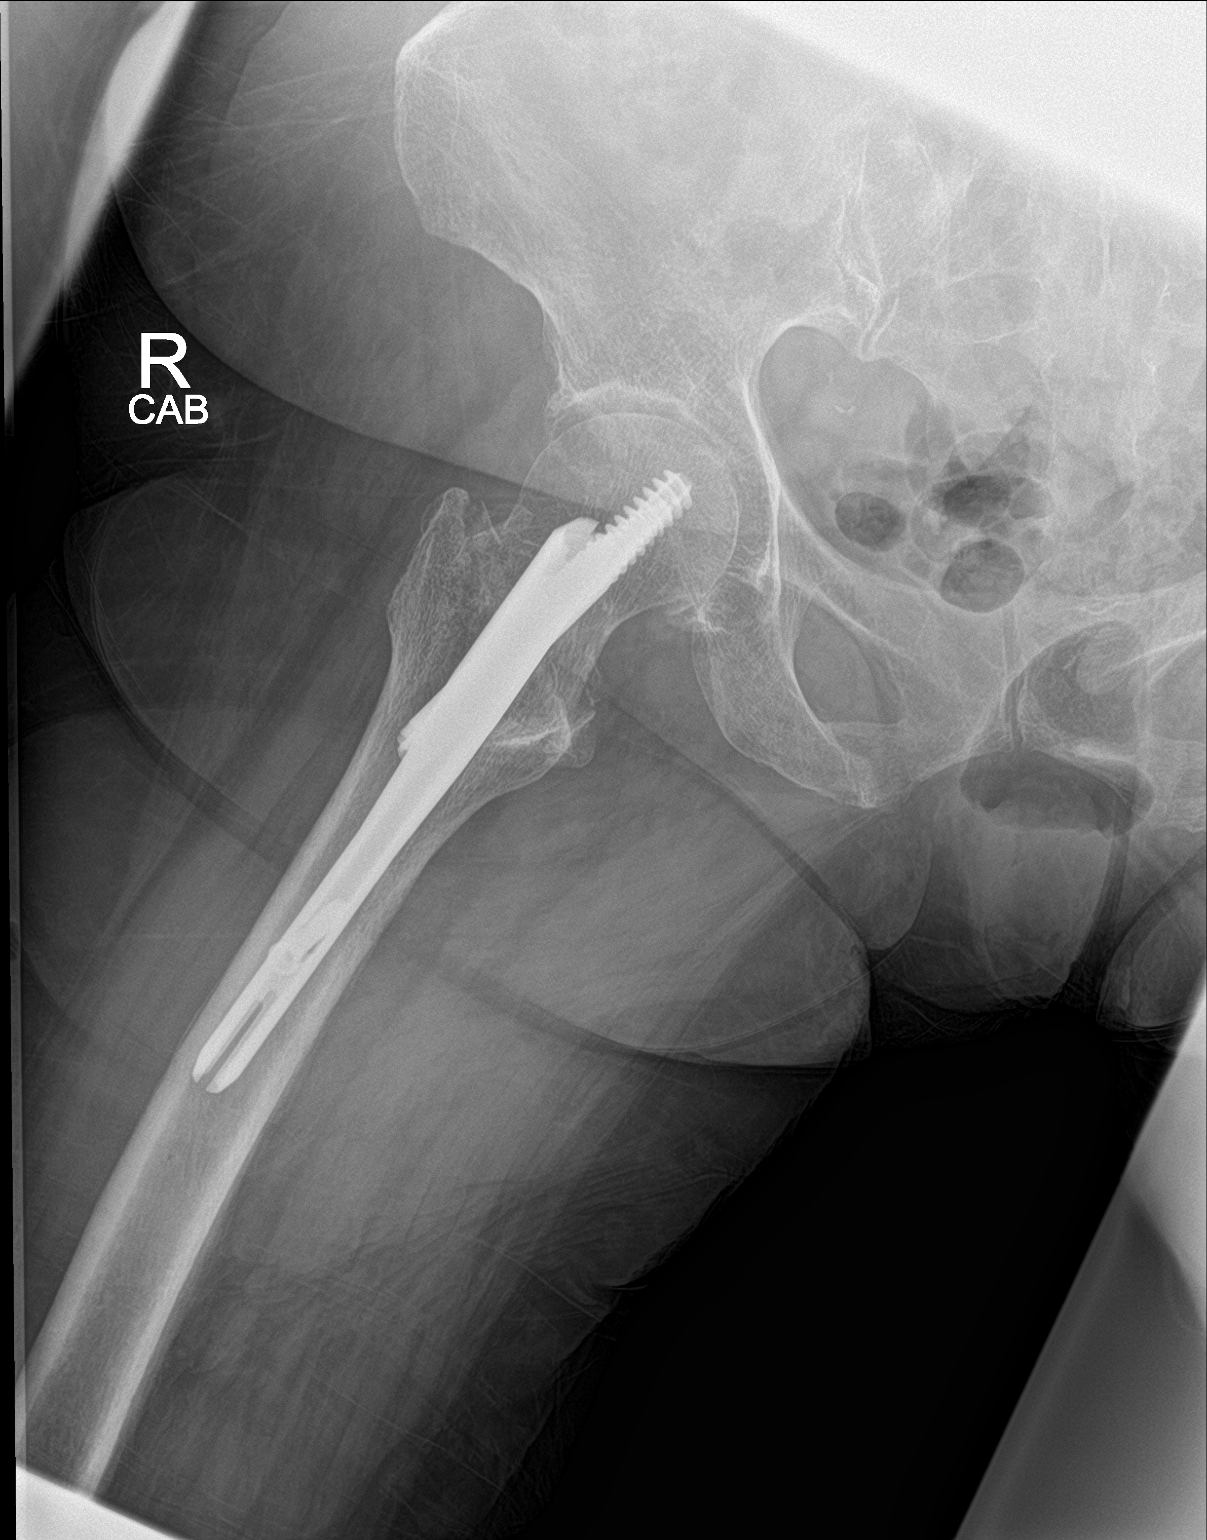

[3 of 3 positions shown; findings below may reference images not displayed]

FINDINGS: No acute fracture. Orthopedic hardware including a short right
proximal femur intramedullary rod supporting 2 screws appears
well-seated with no evidence of loosening. There is axial right hip
joint space narrowing, stable from the prior study.

The SI joints, symphysis pubis and left hip joint are normally
spaced and aligned.

Bones are demineralized.

Soft tissues are unremarkable.
IMPRESSION: 1. No fracture or dislocation.
2. No evidence of loosening of the right proximal femur ORIF
hardware.

## 2018-08-25 ENCOUNTER — Ambulatory Visit (INDEPENDENT_AMBULATORY_CARE_PROVIDER_SITE_OTHER): Payer: Medicare Other | Admitting: Family Medicine

## 2018-08-25 ENCOUNTER — Other Ambulatory Visit: Payer: Self-pay

## 2018-08-25 ENCOUNTER — Encounter: Payer: Self-pay | Admitting: Family Medicine

## 2018-08-25 DIAGNOSIS — G8929 Other chronic pain: Secondary | ICD-10-CM

## 2018-08-25 DIAGNOSIS — M25551 Pain in right hip: Secondary | ICD-10-CM

## 2018-08-25 MED ORDER — ACETAMINOPHEN 500 MG PO TABS: 1000.0000 mg | ORAL_TABLET | Freq: Three times a day (TID) | ORAL | 0 refills | Status: DC

## 2018-08-25 MED ORDER — NORTRIPTYLINE HCL 25 MG PO CAPS: 25.0000 mg | ORAL_CAPSULE | Freq: Every day | ORAL | 1 refills | Status: DC

## 2018-08-25 NOTE — Assessment & Plan Note (Addendum)
Difficult case. Has seen multiple providers including sports medicine, pain management and orthopedics with ongoing chronic severe lifestyle limiting R hip pain. Currently taking oxycodone 5mg  2 tab several times a day, going through 30 tablets in about a week. Has tried and failed several other medications including OTC meds. NSAIDs limited due to eliquis use. Lyrica not helping as much as would hope.  Recommend to start scheduled tylenol 1000mg  TID. Discussed conservative measures of ice, topical treatments including voltaren gel (she has at home).  We discussed other treatment options such as TCAs or muscle relaxant (states only muscle relaxant that has helped was norflex). Pt/daughter interested in trial of pamelor - we discussed possible reaction given similarities to flexeril - they still desire to try this medication.  Discussed I would not fill chronic pain narcotic but would touch base with PCP who will be in office tomorrow.  Encouraged ongoing efforts to establish with pain management. She may benefit from long acting baseline narcotic. They have had trouble getting into a pain clinic - advised to contact preferred pain to find if they will reschedule and if not will need new referral.  Pt/daughter agree with plan. Will touch base with PCP re further options.

## 2018-08-25 NOTE — Telephone Encounter (Signed)
Please see my response to the patient also. Thank you

## 2018-08-25 NOTE — Progress Notes (Addendum)
Virtual visit attempted through WebEx, pt was unable to connect.  Patient location: home Provider location: [place] [facility], office Patient is with her daughter [name] at this visit as well.  If any vitals were documented below, they were collected by patient at home.    Interactive audio and video telecommunications were attempted between myself and [name], however failed due to patient having technical difficulties. We continued and completed visit with audio only.  Time: 3:46pm - 4:11pm   BP 135/78 (BP Location: Right Arm, Patient Position: Sitting, Cuff Size: Normal)   Pulse 74   Temp 97.6 F (36.4 C) (Oral)   Ht 5\' 4"  (1.626 m)   BMI 26.40 kg/m    CC: chronic R hip pain Subjective:    Patient ID: [name], female    DOB: October 31, 1940, 78 y.o.   MRN: [id number]  HPI: [name] is a 78 y.o. female presenting on 08/25/2018 for Hip Pain (C/o right hip pain.  Pt's daughter, [name] will attend visit. )   Daughter gives most of the history.  Chronic R hip pain going on over a year. 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 Upcoming f/u appt Friday with ortho Dr [name].   Ongoing R lateral hip pain from hip to below knee. Describes severe grabbing twisting pain. No burning pain.  Taking tylenol 1000mg  PRN.  Taking oxycodone 5mg  regularly taking 2 tab several times a day. She also regularly takes lyrica 100mg  BID.  H/o tramadol and toradol intolerance, want to avoid NSAIDs due to eliquis use.  They state injections into hip and back didn't help.  Tried ice and heat.  States norflex is only muscle relaxant she has been able to tolerate in the past.   Chart review shows ER visit on 08/02/2018 for same hip pain, with unrevealing films, treated with IV decadron and prednisone taper.  Seems she was discharged from Dr Laban Emperor pain clinic due to repeatedly missed appointments and then also arrived late to  preferred pain clinic to establish care - daughter states she was told the wrong time (08/10/2018). Has not received call to reschedule.   Received #40 percocets 07/22/2018, #30 oxycodone on 07/27/2018 and again #30 oxycodone 5mg  on 08/05/2018 and 08/17/2018.  She has been regularly using oxycodone 2 tablets Q6 hours.   According to daughter she is no longer taking trazodone.     Relevant past medical, surgical, family and social history reviewed and updated as indicated. Interim medical history since our last visit reviewed. Allergies and medications reviewed and updated. Outpatient Medications Prior to Visit  Medication Sig Dispense Refill  . albuterol (ACCUNEB) 1.25 MG/3ML nebulizer solution Take 1 ampule by nebulization every 6 (six) hours as needed for wheezing.    Marland Kitchen albuterol (PROVENTIL HFA;VENTOLIN HFA) 108 (90 BASE) MCG/ACT inhaler Inhale 2 puffs into the lungs every 6 (six) hours as needed for wheezing or shortness of breath.     Marland Kitchen alendronate (FOSAMAX) 70 MG tablet Take 70 mg by mouth every Monday. Take with a full glass of water on an empty stomach.    . ALPRAZolam (XANAX) 1 MG tablet TAKE 1 TABLET (1 MG TOTAL) BY MOUTH AT BEDTIME AS NEEDED FOR SLEEP. 30 tablet 1  . amLODipine (NORVASC) 5 MG tablet Take 5 mg by mouth daily.     . Calcium 500-100 MG-UNIT CHEW Chew 1 tablet by mouth daily.    . Cholecalciferol (VITAMIN D HIGH POTENCY  PO) Take 5,000 Units by mouth daily.    . citalopram (CELEXA) 20 MG tablet Take 20 mg by mouth daily.     Marland Kitchen docusate sodium (COLACE) 100 MG capsule Take 200 mg by mouth 2 (two) times daily as needed for moderate constipation.     Clinical research associate Bandages & Supports (MEDICAL COMPRESSION SOCKS) MISC 1 each by Does not apply route daily as needed. 2 each 0  . ELIQUIS 5 MG TABS tablet TAKE 1 TABLET (5 MG TOTAL) BY MOUTH 2 (TWO) TIMES DAILY. START AFTER 10MG  TWICE DAY DOSING 60 tablet 2  . esomeprazole (NEXIUM) 40 MG capsule Take 40 mg by mouth daily.     . fluticasone  (FLOVENT HFA) 220 MCG/ACT inhaler Inhale 1 puff into the lungs 2 (two) times daily.    Boris Lown Oil 500 MG CAPS Take 500 mg by mouth daily.     Marland Kitchen LINZESS 290 MCG CAPS capsule TAKE 1 CAPSULE (290 MCG TOTAL) BY MOUTH EVERY MORNING BEFORE BREAKFAST KEEP IN ORIGINAL BOTTLE. 90 capsule 1  . lovastatin (MEVACOR) 10 MG tablet Take 10 mg by mouth daily.    . ondansetron (ZOFRAN ODT) 4 MG disintegrating tablet Take 1 tablet (4 mg total) by mouth every 8 (eight) hours as needed for nausea or vomiting. 30 tablet 0  . oxyCODONE (OXY IR/ROXICODONE) 5 MG immediate release tablet Take 1-2 tablets (5-10 mg total) by mouth every 6 (six) hours as needed for severe pain. 30 tablet 0  . polyethylene glycol (MIRALAX / GLYCOLAX) packet Take 17 g by mouth daily as needed (for constipation.).     Marland Kitchen pregabalin (LYRICA) 100 MG capsule Take 1 capsule (100 mg total) by mouth 2 (two) times daily. 60 capsule 5  . vitamin B-12 (CYANOCOBALAMIN) 500 MCG tablet Take 500 mcg by mouth daily.    Marland Kitchen doxycycline (VIBRAMYCIN) 100 MG capsule Take 1 capsule (100 mg total) by mouth 2 (two) times daily. 14 capsule 0  . guaiFENesin (MUCINEX) 600 MG 12 hr tablet Take 600 mg by mouth 2 (two) times daily as needed for cough.    . Menthol, Topical Analgesic, (BIOFREEZE EX) Apply 1 application topically daily as needed (muscle pain).    . predniSONE (DELTASONE) 20 MG tablet 3 tabs by mouth daily x 3 days, then 2 tabs by mouth daily x 2 days then 1 tab by mouth daily x 2 days 15 tablet 0  . traZODone (DESYREL) 100 MG tablet Take 100 mg by mouth at bedtime.      No facility-administered medications prior to visit.      Per HPI unless specifically indicated in ROS section below Review of Systems Objective:    BP 135/78 (BP Location: Right Arm, Patient Position: Sitting, Cuff Size: Normal)   Pulse 74   Temp 97.6 F (36.4 C) (Oral)   Ht 5\' 4"  (1.626 m)   BMI 26.40 kg/m   Wt Readings from Last 3 Encounters:  08/11/18 153 lb 12.8 oz (69.8 kg)   08/02/18 169 lb (76.7 kg)  07/27/18 176 lb (79.8 kg)    Physical Exam   Physical exam not done as this was an audio visit.     Dg Hip Unilat W Or Wo Pelvis 2-3 Views Right Result Date: 08/02/2018 CLINICAL DATA:  Painful right hip. EXAM: DG HIP (WITH OR WITHOUT PELVIS) 2-3V RIGHT COMPARISON:  June 25, 2018 FINDINGS: Status post right hip replacement. Acetabular and femoral components are in good position. No acute fractures. No other acute abnormalities.  No dislocation. IMPRESSION: Status post right hip replacement.  No acute abnormality. Electronically Signed   By: Gerome Sam III M.D   On: 08/02/2018 16:45   Lab Results  Component Value Date   CREATININE 0.94 05/17/2018   BUN 7 (L) 05/17/2018   NA 139 05/17/2018   K 3.6 05/17/2018   CL 106 05/17/2018   CO2 24 05/17/2018    Assessment & Plan:  Over 25 minutes were spent face-to-face with the patient during this encounter and >50% of that time was spent on counseling and coordination of care  Problem List Items Addressed This Visit    Chronic hip pain (Right) (Chronic)    Difficult case. Has seen multiple providers including sports medicine, pain management and orthopedics with ongoing chronic severe lifestyle limiting R hip pain. Currently taking oxycodone 5mg  2 tab several times a day, going through 30 tablets in about a week. Has tried and failed several other medications including OTC meds. NSAIDs limited due to eliquis use. Lyrica not helping as much as would hope.  Recommend to start scheduled tylenol 1000mg  TID. Discussed conservative measures of ice, topical treatments including voltaren gel (she has at home).  We discussed other treatment options such as TCAs or muscle relaxant (states only muscle relaxant that has helped was norflex). Pt/daughter interested in trial of pamelor - we discussed possible reaction given similarities to flexeril - they still desire to try this medication.  Discussed I would not fill chronic pain  narcotic but would touch base with PCP who will be in office tomorrow.  Encouraged ongoing efforts to establish with pain management. She may benefit from long acting baseline narcotic. They have had trouble getting into a pain clinic - advised to contact preferred pain to find if they will reschedule and if not will need new referral.  Pt/daughter agree with plan. Will touch base with PCP re further options.       Relevant Medications   nortriptyline (PAMELOR) 25 MG capsule   acetaminophen (TYLENOL) 500 MG tablet       Meds ordered this encounter  Medications  . nortriptyline (PAMELOR) 25 MG capsule    Sig: Take 1 capsule (25 mg total) by mouth at bedtime.    Dispense:  30 capsule    Refill:  1  . acetaminophen (TYLENOL) 500 MG tablet    Sig: Take 2 tablets (1,000 mg total) by mouth 3 (three) times daily.    Dispense:  30 tablet    Refill:  0   No orders of the defined types were placed in this encounter.   Follow up plan: No follow-ups on file.  Eustaquio Boyden, MD

## 2018-08-26 ENCOUNTER — Encounter: Payer: Self-pay | Admitting: Family Medicine

## 2018-08-26 ENCOUNTER — Other Ambulatory Visit: Payer: Self-pay | Admitting: Family Medicine

## 2018-08-26 DIAGNOSIS — J42 Unspecified chronic bronchitis: Secondary | ICD-10-CM

## 2018-08-26 DIAGNOSIS — R0982 Postnasal drip: Secondary | ICD-10-CM

## 2018-08-26 DIAGNOSIS — G8929 Other chronic pain: Secondary | ICD-10-CM

## 2018-08-26 DIAGNOSIS — M545 Low back pain, unspecified: Secondary | ICD-10-CM

## 2018-08-26 DIAGNOSIS — M4802 Spinal stenosis, cervical region: Secondary | ICD-10-CM

## 2018-08-26 DIAGNOSIS — M25551 Pain in right hip: Secondary | ICD-10-CM

## 2018-08-26 MED ORDER — ALBUTEROL SULFATE 1.25 MG/3ML IN NEBU: 1.0000 | INHALATION_SOLUTION | Freq: Four times a day (QID) | RESPIRATORY_TRACT | 1 refills | Status: AC | PRN

## 2018-08-26 MED ORDER — OXYCODONE HCL 5 MG PO TABS: 5.0000 mg | ORAL_TABLET | Freq: Four times a day (QID) | ORAL | 0 refills | Status: DC | PRN

## 2018-08-28 ENCOUNTER — Encounter: Payer: Self-pay | Admitting: Family Medicine

## 2018-08-31 ENCOUNTER — Telehealth: Payer: Self-pay | Admitting: Family Medicine

## 2018-08-31 NOTE — Telephone Encounter (Signed)
[name] (daughter) called to let you know pt cannot take nortriptyline.  She sleeps all the time.  [name] took her off this 2 nights ago  She is back to her self.  Can you call something else for pt  [phone]  [facility]

## 2018-09-02 NOTE — Telephone Encounter (Signed)
I'm glad she is getting back to her old self. Any word from [facility] about an appointment?

## 2018-09-03 ENCOUNTER — Ambulatory Visit: Payer: Medicare Other | Admitting: Family Medicine

## 2018-09-03 ENCOUNTER — Other Ambulatory Visit: Payer: Self-pay | Admitting: Family Medicine

## 2018-09-03 ENCOUNTER — Other Ambulatory Visit: Payer: Self-pay

## 2018-09-03 ENCOUNTER — Telehealth: Payer: Self-pay

## 2018-09-03 DIAGNOSIS — M545 Low back pain, unspecified: Secondary | ICD-10-CM

## 2018-09-03 DIAGNOSIS — G8929 Other chronic pain: Secondary | ICD-10-CM

## 2018-09-03 DIAGNOSIS — M25551 Pain in right hip: Secondary | ICD-10-CM

## 2018-09-03 DIAGNOSIS — M4802 Spinal stenosis, cervical region: Secondary | ICD-10-CM

## 2018-09-03 MED ORDER — OXYCODONE HCL 5 MG PO TABS: 5.0000 mg | ORAL_TABLET | Freq: Four times a day (QID) | ORAL | 0 refills | Status: AC | PRN

## 2018-09-03 NOTE — Telephone Encounter (Signed)
Noted  

## 2018-09-03 NOTE — Telephone Encounter (Signed)
Inbound call to triage - patient's daughter states patient is out of pain medication. Reports patient is crying and stating she she wants to die.   Pain management appt on 09/10/18.

## 2018-09-03 NOTE — Telephone Encounter (Signed)
Please call patient's daughter and let her know that I sent in pain medication to get her through until she is seen by pain management next week.

## 2018-09-03 NOTE — Telephone Encounter (Signed)
Outgoing call to patient's daughter, [name]. Advised her that patient's PCP has sent pain medication to pharmacy.

## 2018-09-03 NOTE — Telephone Encounter (Signed)
Appointment is scheduled with [facility] on  09/10/2018 per [facility].

## 2018-09-04 ENCOUNTER — Inpatient Hospital Stay: Payer: Medicare Other | Admitting: Oncology

## 2018-09-04 ENCOUNTER — Inpatient Hospital Stay: Payer: Medicare Other

## 2018-09-08 ENCOUNTER — Encounter: Payer: Self-pay | Admitting: Family Medicine

## 2018-09-09 ENCOUNTER — Inpatient Hospital Stay: Payer: Medicare Other | Admitting: Oncology

## 2018-09-09 IMAGING — CT CT HIP*R* W/O CM
3 of 5 series · 13 of 42 positions shown, 19 images · non-contrast
Comparison: None

CLINICAL DATA: Status post fall.  Right hip pain.

EXAM:
CT OF THE RIGHT HIP WITHOUT CONTRAST
TECHNIQUE: Multidetector CT imaging of the right hip was performed according to
the standard protocol. Multiplanar CT image reconstructions were
also generated.

[Series 5: hip soft · axial · 0.51mm/px · z∈[-289,-84]mm · 8 of 106 slices shown, 13 images]
[im 12/106  soft-tissue]
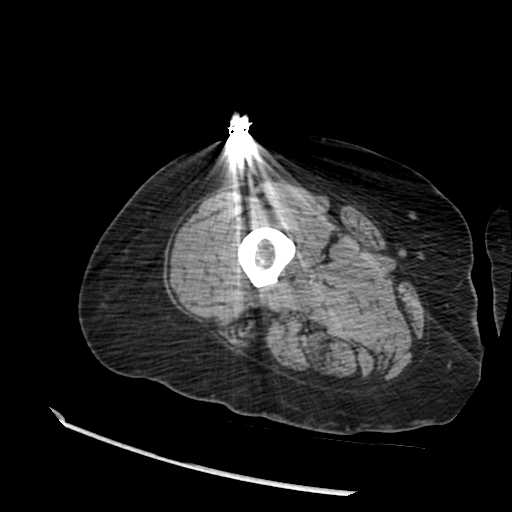
[im 12/106  bone]
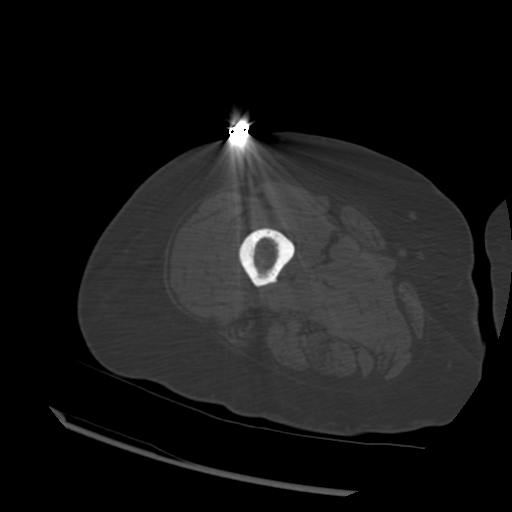
[im 24/106  soft-tissue]
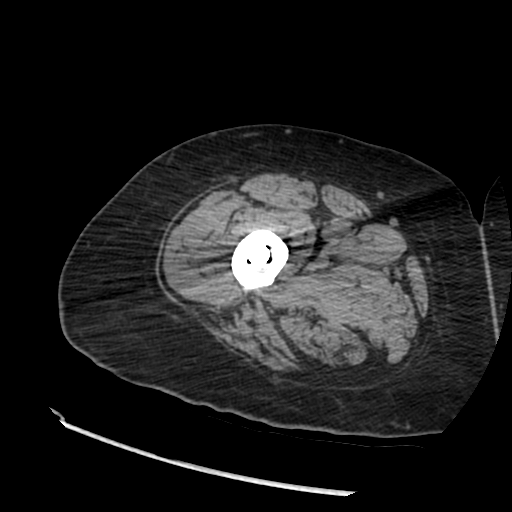
[im 36/106  soft-tissue]
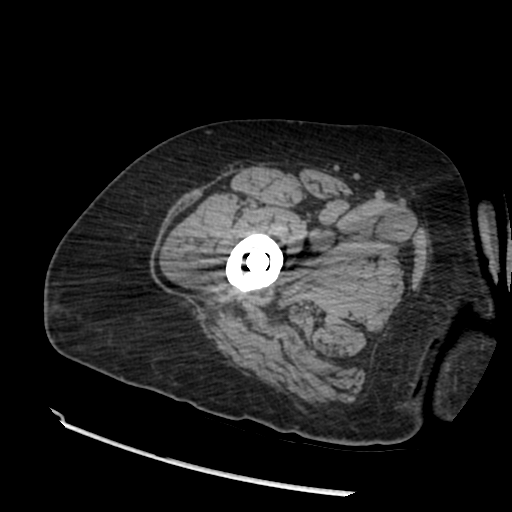
[im 47/106  soft-tissue]
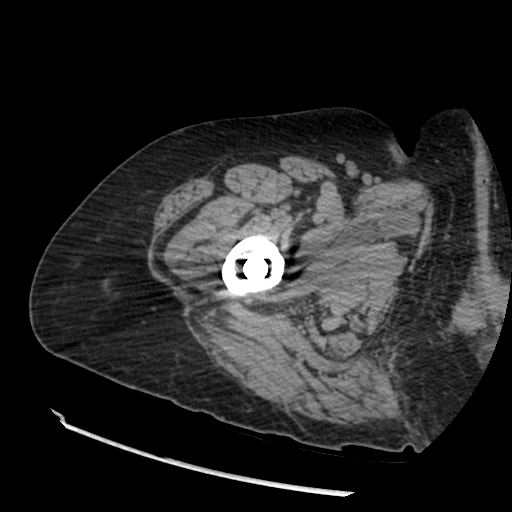
[im 59/106  soft-tissue]
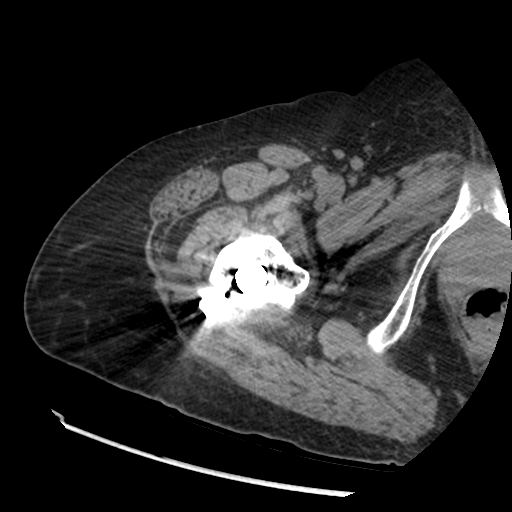
[im 59/106  lung]
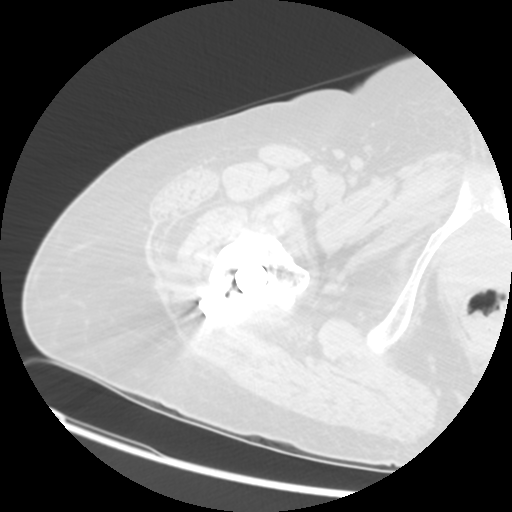
[im 71/106  soft-tissue]
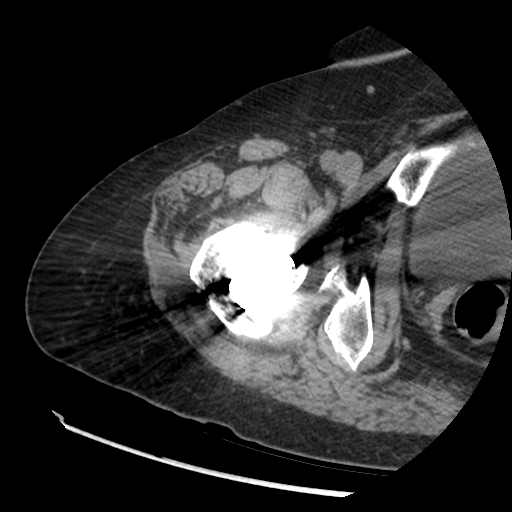
[im 71/106  lung]
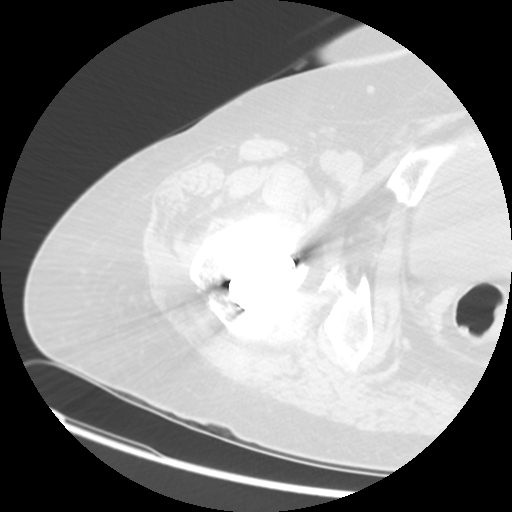
[im 82/106  soft-tissue]
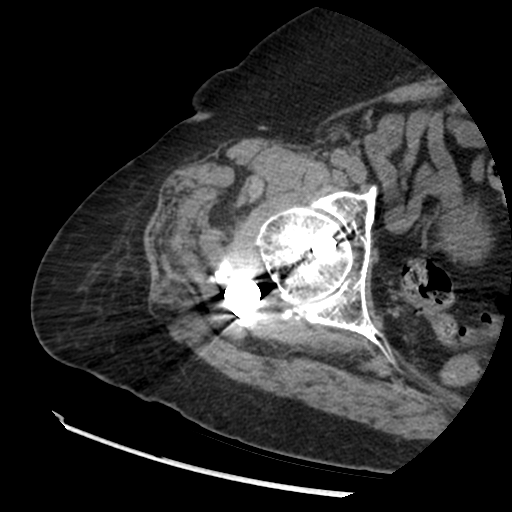
[im 82/106  lung]
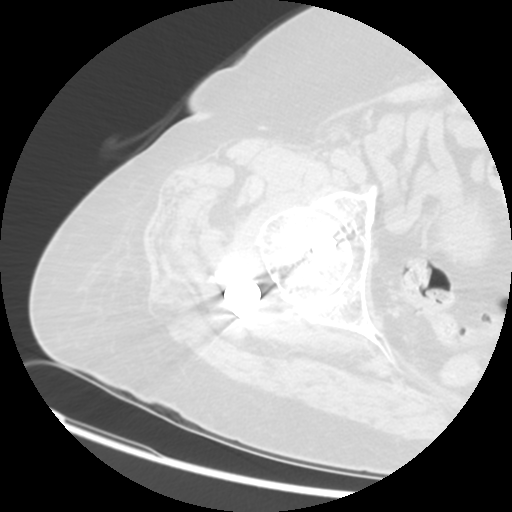
[im 94/106  soft-tissue]
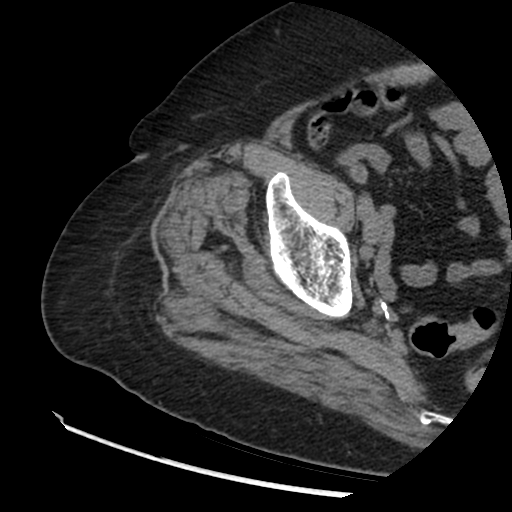
[im 94/106  lung]
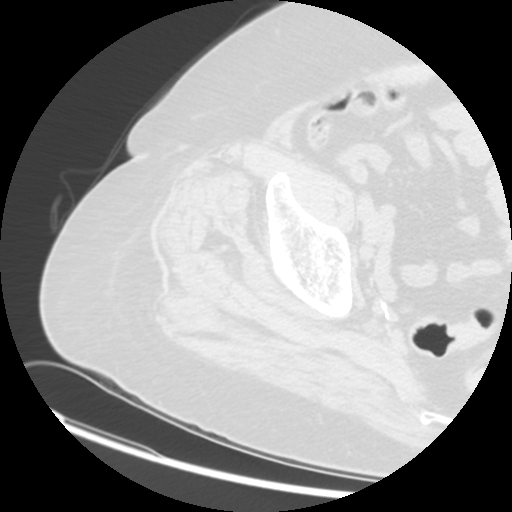

[Series 200: sagittal bone · sagittal · 0.44mm/px · 2 of 116 slices shown]
[im 12/116  bone]
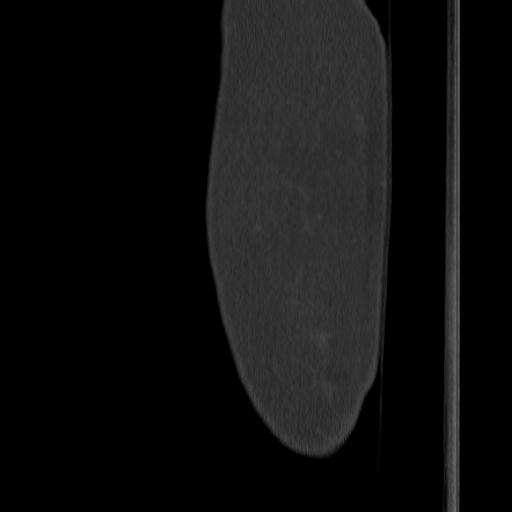
[im 24/116  bone]
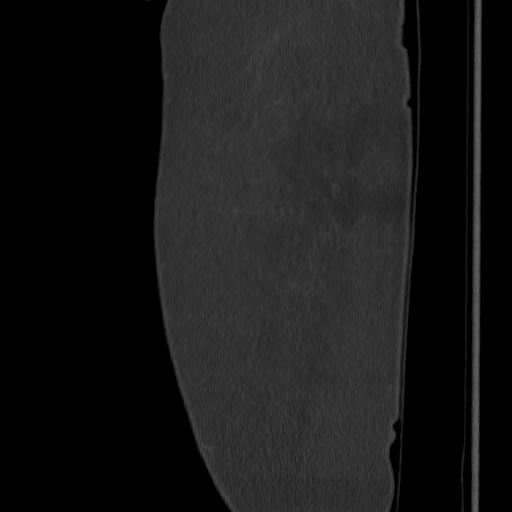

[Series 202: coronal soft · coronal · 0.44mm/px · 3 of 113 slices shown, 4 images]
[im 29/113  soft-tissue]
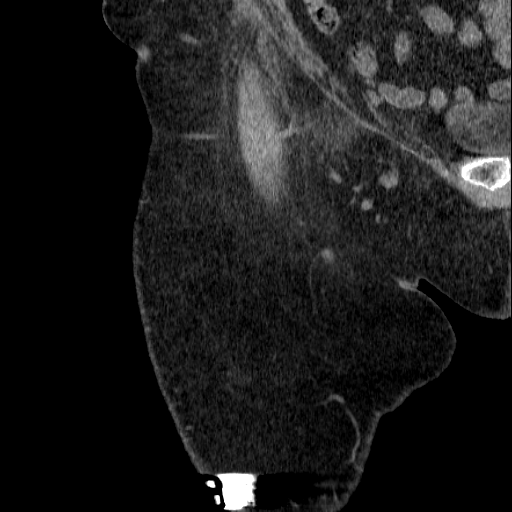
[im 57/113  soft-tissue]
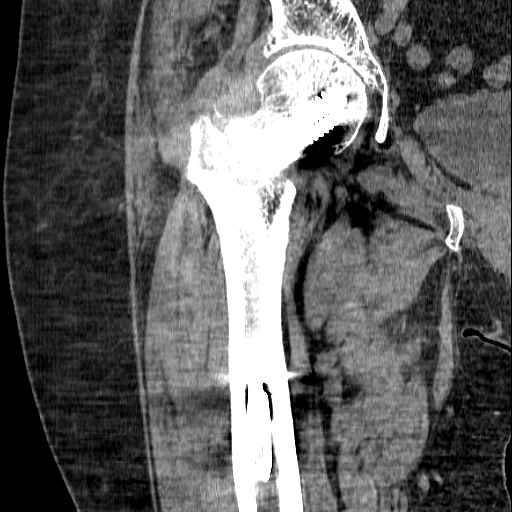
[im 57/113  bone]
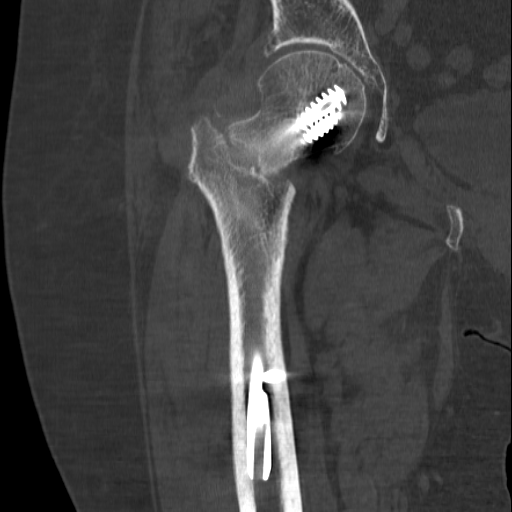
[im 85/113  soft-tissue]
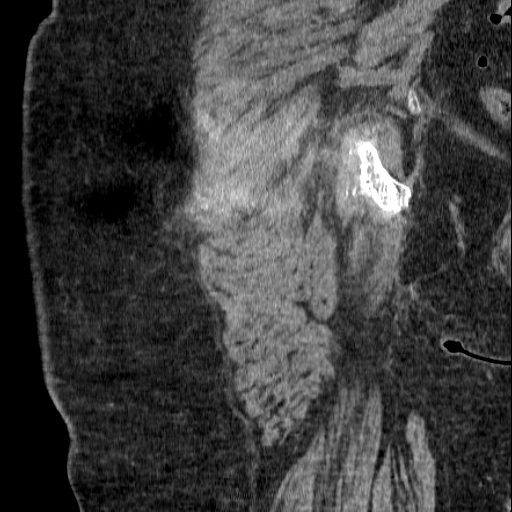

[13 of 42 positions shown; findings below may reference images not displayed]

FINDINGS: Bones/Joint/Cartilage

Generalized osteopenia. Prior right hip ORIF with a intramedullary
nail and interlocking femoral neck screw in satisfactory position
without hardware failure or complication.

Acute nondisplaced right intertrochanteric fracture. Medial fracture
line involves the superior most aspect of the lesser trochanter.

No other fracture or dislocation. Normal alignment. No joint
effusion.

Ligaments

Ligaments are suboptimally evaluated by CT.

Muscles and Tendons
Muscles are normal. No muscle atrophy. No intramuscular fluid
collection or hematoma.

Soft tissue
No fluid collection or hematoma.  No soft tissue mass.
IMPRESSION: 1. Acute nondisplaced right intertrochanteric fracture with the
medial fracture line involving the superior most aspect of the
lesser trochanter.

## 2018-09-10 ENCOUNTER — Other Ambulatory Visit: Payer: Self-pay | Admitting: Family Medicine

## 2018-09-10 DIAGNOSIS — R300 Dysuria: Secondary | ICD-10-CM

## 2018-09-10 MED ORDER — LOVASTATIN 10 MG PO TABS: 10.0000 mg | ORAL_TABLET | Freq: Every day | ORAL | 3 refills | Status: DC

## 2018-09-11 ENCOUNTER — Encounter: Payer: Self-pay | Admitting: Family Medicine

## 2018-09-14 ENCOUNTER — Other Ambulatory Visit: Payer: Self-pay | Admitting: Family Medicine

## 2018-09-16 ENCOUNTER — Other Ambulatory Visit: Payer: Self-pay | Admitting: Family Medicine

## 2018-09-16 NOTE — Telephone Encounter (Signed)
Pharmacy is requesting 90-day rx.  Is this ok?

## 2018-09-21 ENCOUNTER — Other Ambulatory Visit: Payer: Self-pay | Admitting: Family Medicine

## 2018-09-21 NOTE — Telephone Encounter (Signed)
Spoke with pharmacist at [facility].  He states both prescriptions were written by Alvester Chou.  Amlodopine was written in 02/2018 and  last picked up 06/20/2018 for a 90 day supply.  Citalopram was written June 2019 and was last picked up in December for a one month supply.

## 2018-09-21 NOTE — Telephone Encounter (Signed)
I have refilled amlodipine. Please call patient and see if she has been taking citalopram.

## 2018-09-22 ENCOUNTER — Other Ambulatory Visit: Payer: Self-pay | Admitting: Family Medicine

## 2018-09-22 MED ORDER — CITALOPRAM HYDROBROMIDE 20 MG PO TABS: 30.0000 mg | ORAL_TABLET | Freq: Every day | ORAL | 1 refills | Status: DC

## 2018-09-22 NOTE — Telephone Encounter (Signed)
Spoke with [name] (daughter).  She states Ms. Dillion is taking the citalopram 1 1/2 tablets every day.

## 2018-09-22 NOTE — Telephone Encounter (Signed)
Citalopram refill sent to patient's pharmacy.

## 2018-09-24 ENCOUNTER — Other Ambulatory Visit: Payer: Self-pay | Admitting: Family Medicine

## 2018-09-24 NOTE — Telephone Encounter (Signed)
Note    Name of Medication: Alpazolam Duanne Moron) 1 MG tablet  [facility]  Last Fill or Written Date and Quantity: 07/24/2018 for #30 with 1 refill  Last Office Visit and Type: 08/25/2018 with Dr. Darnell Level and last time with Clarene Reamer on 07/27/2018   Next Office Visit and Type: None

## 2018-09-25 ENCOUNTER — Encounter: Payer: Self-pay | Admitting: Family Medicine

## 2018-09-25 ENCOUNTER — Other Ambulatory Visit: Payer: Self-pay

## 2018-09-25 MED ORDER — FLUTICASONE PROPIONATE HFA 220 MCG/ACT IN AERO: 1.0000 | INHALATION_SPRAY | Freq: Two times a day (BID) | RESPIRATORY_TRACT | 11 refills | Status: DC

## 2018-09-25 MED ORDER — ALBUTEROL SULFATE HFA 108 (90 BASE) MCG/ACT IN AERS: 2.0000 | INHALATION_SPRAY | Freq: Four times a day (QID) | RESPIRATORY_TRACT | 2 refills | Status: AC | PRN

## 2018-09-25 NOTE — Telephone Encounter (Signed)
[facility] from [facility] on behalf of patient asking for refill on Proair and Flovent inhalers. These medications have not been filled by [place] but are listed in her medication list. OK to refill? Or need more information?

## 2018-09-25 NOTE — Telephone Encounter (Signed)
Sent to PCP to advise   Last OV 08/25/2018  Last refilled  30 tablet 1 09/25/2018     Next OV  None scheduled   Sent to PCP to advise

## 2018-09-28 ENCOUNTER — Other Ambulatory Visit: Payer: Self-pay

## 2018-09-29 ENCOUNTER — Inpatient Hospital Stay: Payer: Medicare Other

## 2018-09-29 ENCOUNTER — Other Ambulatory Visit: Payer: Self-pay

## 2018-09-30 ENCOUNTER — Inpatient Hospital Stay: Payer: Medicare Other

## 2018-09-30 ENCOUNTER — Inpatient Hospital Stay: Payer: Medicare Other | Admitting: Oncology

## 2018-09-30 ENCOUNTER — Other Ambulatory Visit: Payer: Self-pay

## 2018-10-01 ENCOUNTER — Other Ambulatory Visit: Payer: Self-pay

## 2018-10-01 ENCOUNTER — Inpatient Hospital Stay: Payer: Medicare Other | Attending: Oncology

## 2018-10-01 DIAGNOSIS — Z7901 Long term (current) use of anticoagulants: Secondary | ICD-10-CM | POA: Insufficient documentation

## 2018-10-01 DIAGNOSIS — I2699 Other pulmonary embolism without acute cor pulmonale: Secondary | ICD-10-CM | POA: Insufficient documentation

## 2018-10-01 DIAGNOSIS — I82A23 Chronic embolism and thrombosis of axillary vein, bilateral: Secondary | ICD-10-CM | POA: Diagnosis not present

## 2018-10-01 DIAGNOSIS — Z87891 Personal history of nicotine dependence: Secondary | ICD-10-CM | POA: Insufficient documentation

## 2018-10-01 LAB — CBC WITH DIFFERENTIAL/PLATELET
Abs Immature Granulocytes: 0 K/uL (ref 0.00–0.07)
Basophils Absolute: 0.1 K/uL (ref 0.0–0.1)
Basophils Relative: 1 %
Eosinophils Absolute: 0.3 K/uL (ref 0.0–0.5)
Eosinophils Relative: 5 %
HCT: 39.5 % (ref 36.0–46.0)
Hemoglobin: 12.2 g/dL (ref 12.0–15.0)
Immature Granulocytes: 0 %
Lymphocytes Relative: 36 %
Lymphs Abs: 1.9 K/uL (ref 0.7–4.0)
MCH: 28 pg (ref 26.0–34.0)
MCHC: 30.9 g/dL (ref 30.0–36.0)
MCV: 90.8 fL (ref 80.0–100.0)
Monocytes Absolute: 0.8 K/uL (ref 0.1–1.0)
Monocytes Relative: 16 %
Neutro Abs: 2.3 K/uL (ref 1.7–7.7)
Neutrophils Relative %: 42 %
Platelets: 210 K/uL (ref 150–400)
RBC: 4.35 MIL/uL (ref 3.87–5.11)
RDW: 14.6 % (ref 11.5–15.5)
WBC: 5.4 K/uL (ref 4.0–10.5)
nRBC: 0 % (ref 0.0–0.2)

## 2018-10-05 ENCOUNTER — Encounter: Payer: Self-pay | Admitting: Family Medicine

## 2018-10-06 ENCOUNTER — Encounter: Payer: Self-pay | Admitting: Oncology

## 2018-10-06 ENCOUNTER — Other Ambulatory Visit: Payer: Self-pay

## 2018-10-06 ENCOUNTER — Inpatient Hospital Stay (HOSPITAL_BASED_OUTPATIENT_CLINIC_OR_DEPARTMENT_OTHER): Payer: Medicare Other | Admitting: Oncology

## 2018-10-06 DIAGNOSIS — Z8249 Family history of ischemic heart disease and other diseases of the circulatory system: Secondary | ICD-10-CM | POA: Diagnosis not present

## 2018-10-06 DIAGNOSIS — Z7901 Long term (current) use of anticoagulants: Secondary | ICD-10-CM

## 2018-10-06 DIAGNOSIS — Z7983 Long term (current) use of bisphosphonates: Secondary | ICD-10-CM

## 2018-10-06 DIAGNOSIS — Z79899 Other long term (current) drug therapy: Secondary | ICD-10-CM

## 2018-10-06 DIAGNOSIS — I82A23 Chronic embolism and thrombosis of axillary vein, bilateral: Secondary | ICD-10-CM

## 2018-10-06 DIAGNOSIS — I2699 Other pulmonary embolism without acute cor pulmonale: Secondary | ICD-10-CM

## 2018-10-06 DIAGNOSIS — Z79891 Long term (current) use of opiate analgesic: Secondary | ICD-10-CM

## 2018-10-06 DIAGNOSIS — Z87891 Personal history of nicotine dependence: Secondary | ICD-10-CM | POA: Diagnosis not present

## 2018-10-06 IMAGING — MR MR KNEE*R* W/O CM
7 series · 40 of 40 positions shown · non-contrast
Comparison: 11/19/2016

CLINICAL DATA: Pain after fall in [place]. Right knee pain and
swelling.

EXAM:
MRI OF THE RIGHT KNEE WITHOUT CONTRAST
TECHNIQUE: Multiplanar, multisequence MR imaging of the knee was performed. No
intravenous contrast was administered.

[Series 4: PD · axial · 3.0mm · 0.29mm/px · z∈[-76,+53]mm · 8 of 40 slices shown (1 of 2)]
[im 1/40]
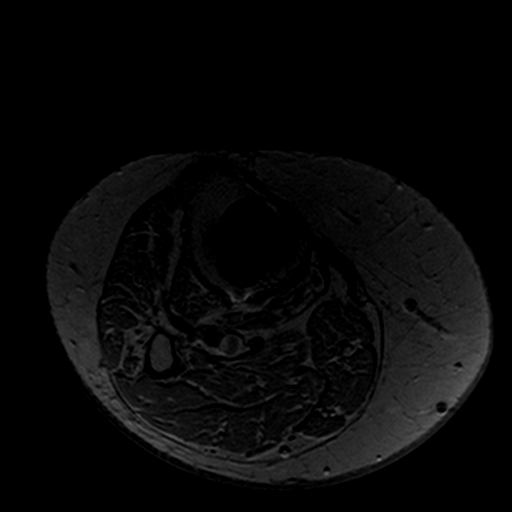
[im 6/40]
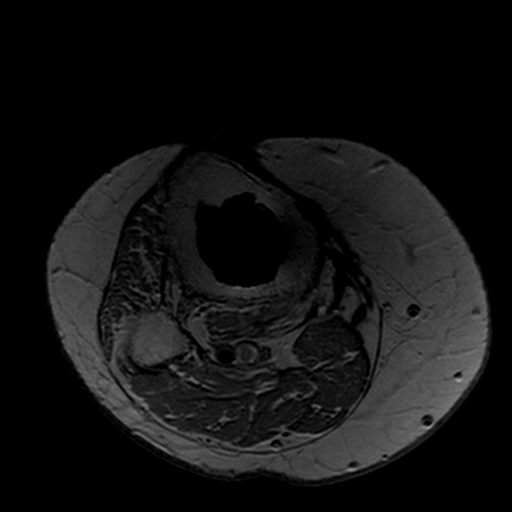
[im 12/40]
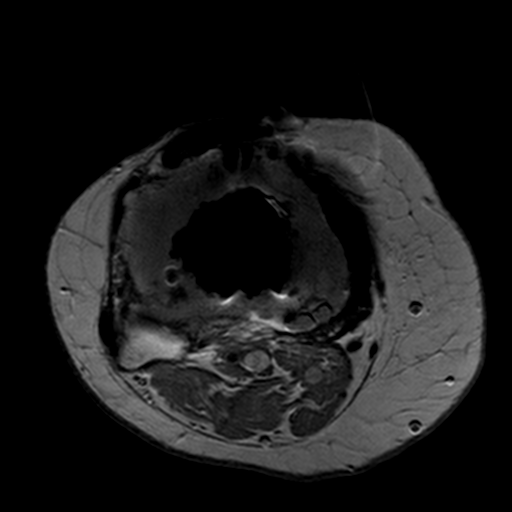
[im 17/40]
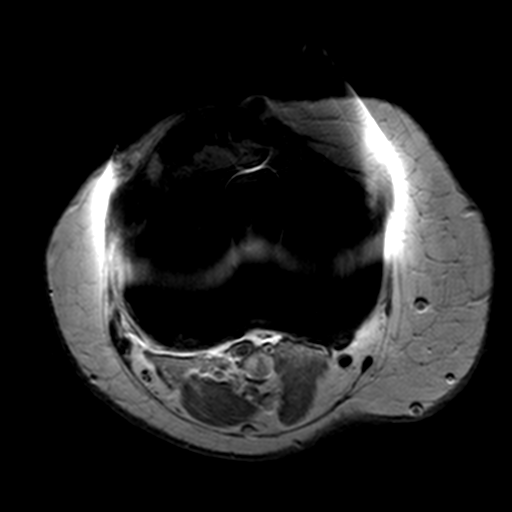
[im 23/40]
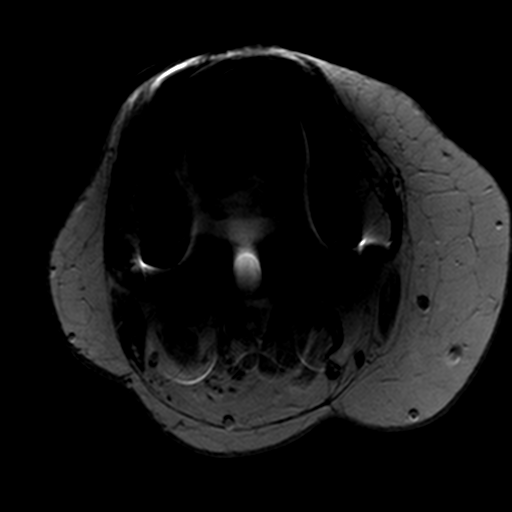
[im 28/40]
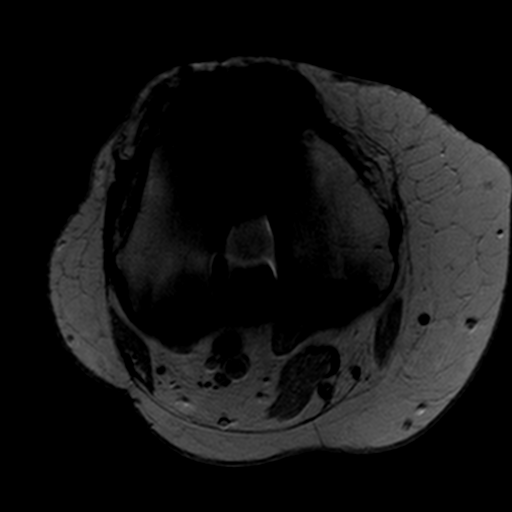
[im 34/40]
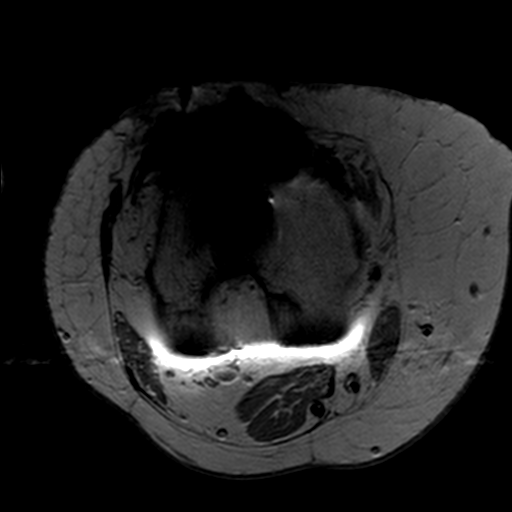
[im 40/40]
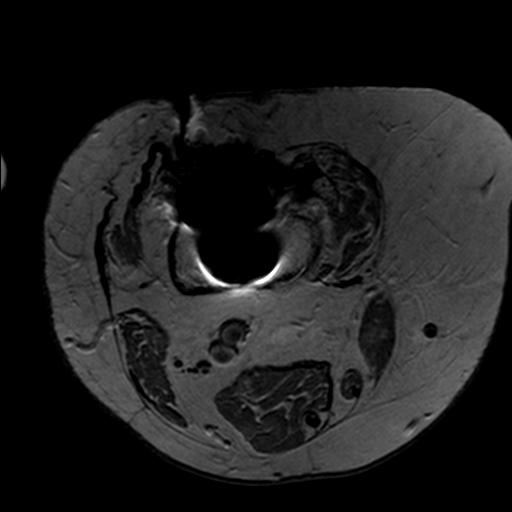

[Series 5: T1 · coronal · 3.0mm · 0.62mm/px · 5 of 33 slices shown (1 of 2)]
[im 1/33]
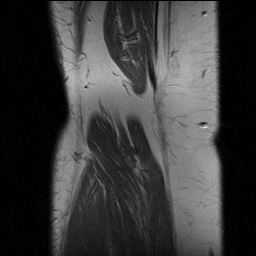
[im 9/33]
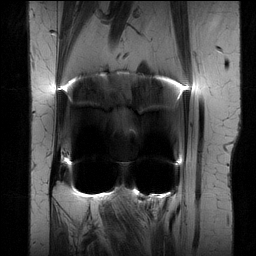
[im 17/33]
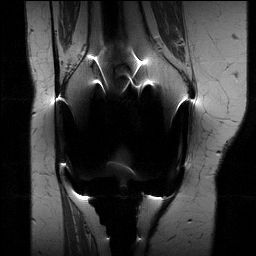
[im 25/33]
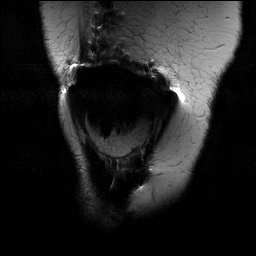
[im 33/33]
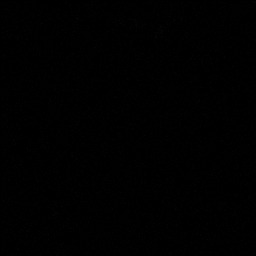

[Series 6: PD · coronal · 3.0mm · 0.62mm/px · 5 of 31 slices shown (2 of 2)]
[im 1/31]
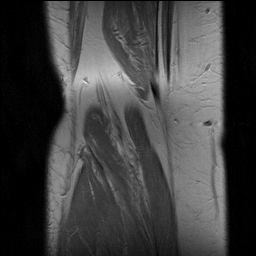
[im 8/31]
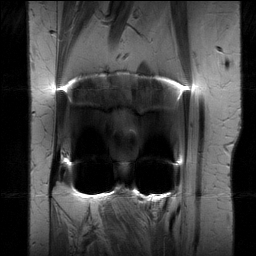
[im 16/31]
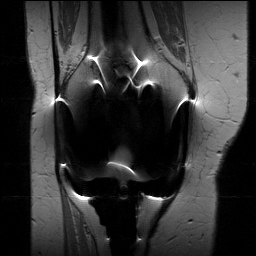
[im 23/31]
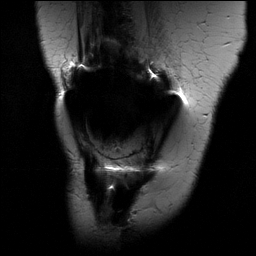
[im 31/31]
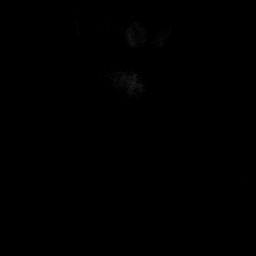

[Series 7: STIR · coronal · 3.0mm · 0.94mm/px · 5 of 32 slices shown (1 of 3)]
[im 1/32]
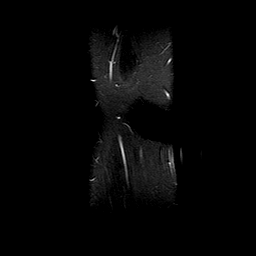
[im 8/32]
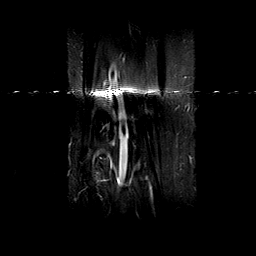
[im 16/32]
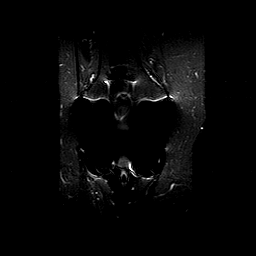
[im 24/32]
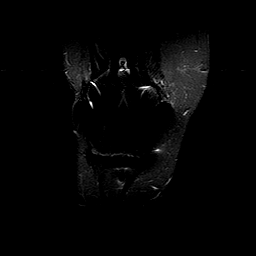
[im 32/32]
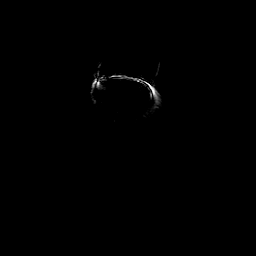

[Series 8: STIR · sagittal · 3.0mm · 0.94mm/px · 5 of 31 slices shown (2 of 3)]
[im 1/31]
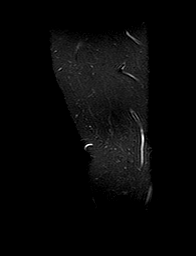
[im 8/31]
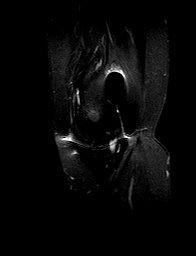
[im 16/31]
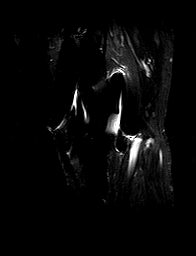
[im 23/31]
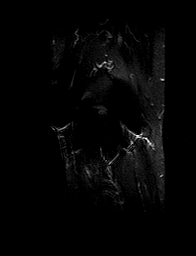
[im 31/31]
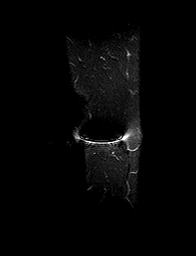

[Series 9: STIR · axial · 5.0mm · 0.70mm/px · z∈[-120,+100]mm · 7 of 41 slices shown (3 of 3)]
[im 1/41]
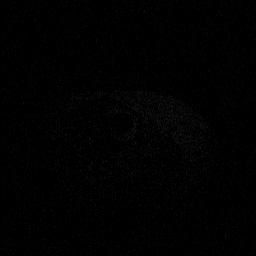
[im 7/41]
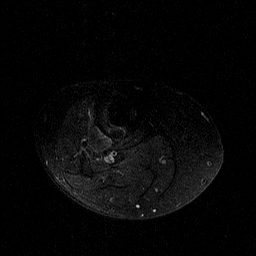
[im 14/41]
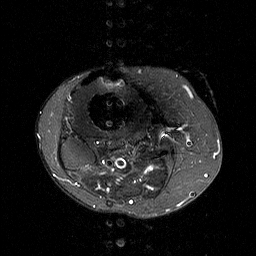
[im 21/41]
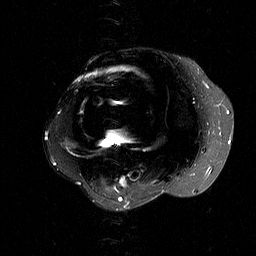
[im 27/41]
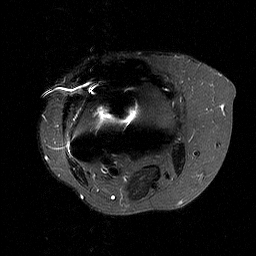
[im 34/41]
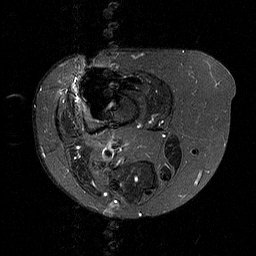
[im 41/41]
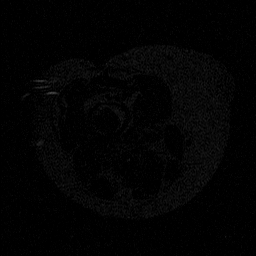

[Series 10: T1 · coronal · 3.0mm · 0.94mm/px · 5 of 33 slices shown (2 of 2)]
[im 1/33]
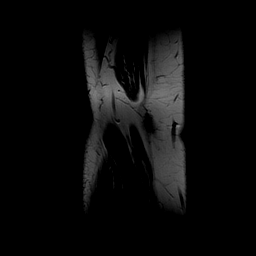
[im 9/33]
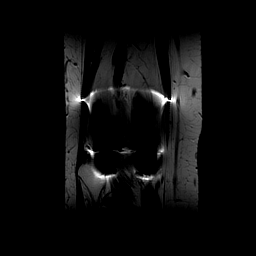
[im 17/33]
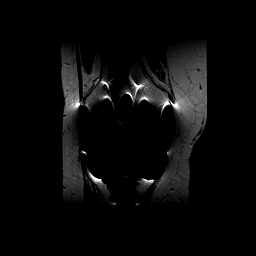
[im 25/33]
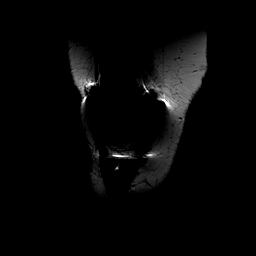
[im 33/33]
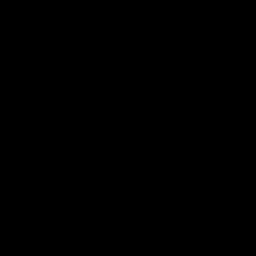

[40 of 40 positions shown; findings below may reference images not displayed]

FINDINGS: Markedly compromised study due to susceptibility artifacts from an
indwelling long-stem semi constrained knee arthroplasty.

Imaging was performed in an attempt to identify any potentially
occult periprosthetic fracture. No such fracture or edema is
identified. No significant joint effusion. Generalized muscle
atrophy is seen.
IMPRESSION: Limited study secondary to pre-existing knee arthroplasty. No occult
fracture about the femoral or tibial stem are identified. There is
generalized muscle atrophy about the knee. No significant fluid
collections are apparent.

## 2018-10-06 IMAGING — MR MR HIP*R* W/O CM
4 of 7 series · 20 of 40 positions shown · non-contrast
Comparison: CT from 12/05/2016

CLINICAL DATA: Patient fell on a prolonged broke right hip. Right
hip pain and groin pain with limited range of motion.

EXAM:
MR OF THE RIGHT HIP WITHOUT CONTRAST
TECHNIQUE: Multiplanar, multisequence MR imaging was performed. No intravenous
contrast was administered.

[Series 3: T1 · coronal · 4.0mm · 0.78mm/px · 4 of 35 slices shown]
[im 1/35]
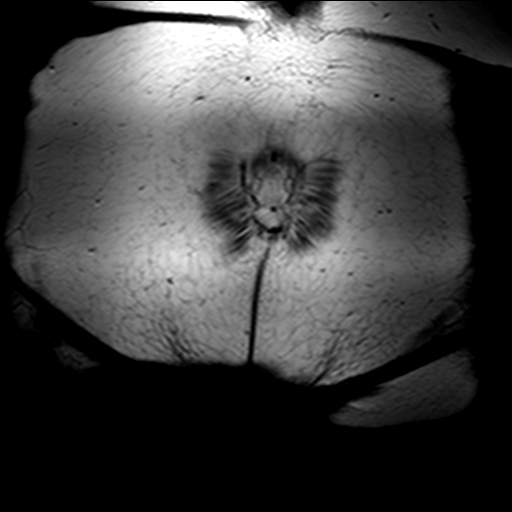
[im 7/35]
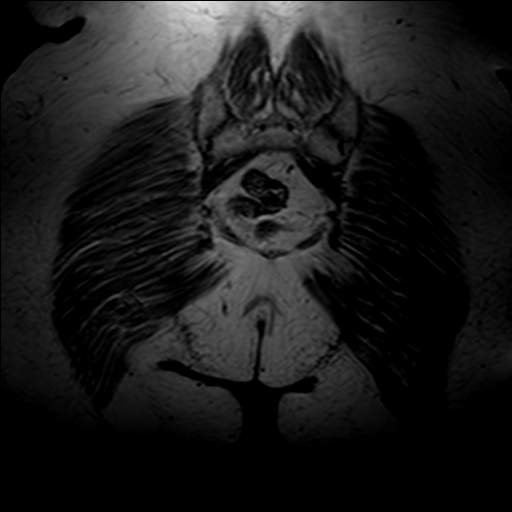
[im 21/35]
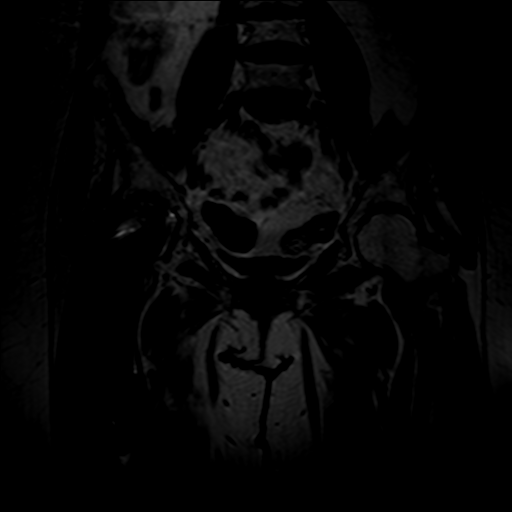
[im 35/35]
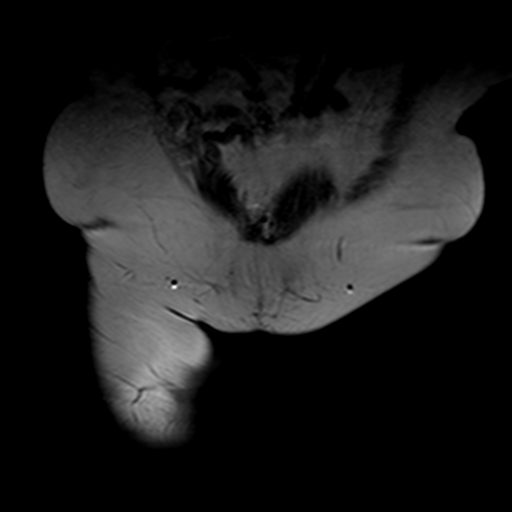

[Series 5: T2 fat-sat · axial · 4.0mm · 0.78mm/px · z∈[-102,+92]mm · 6 of 40 slices shown]
[im 1/40]
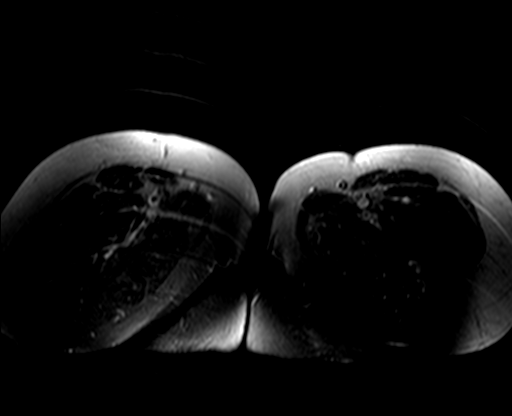
[im 8/40]
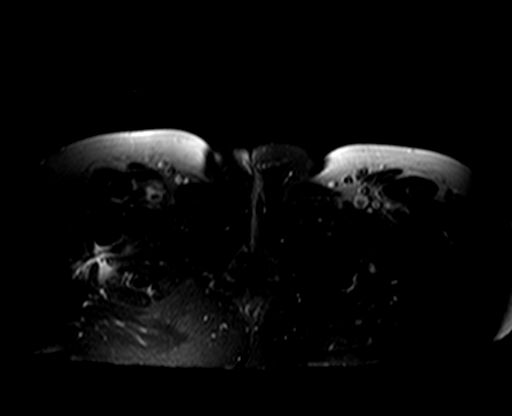
[im 16/40]
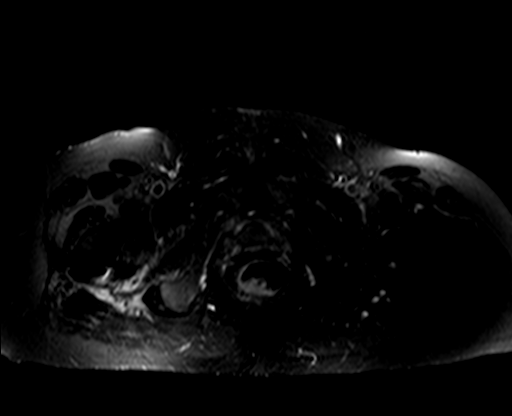
[im 24/40]
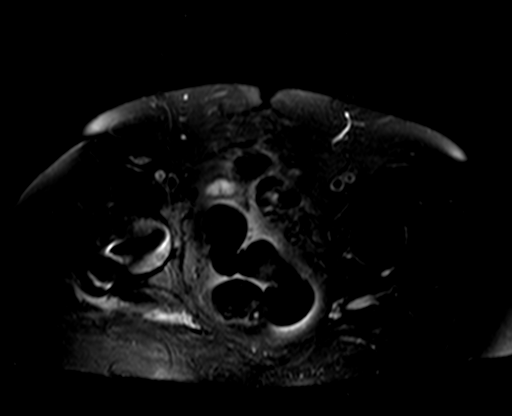
[im 32/40]
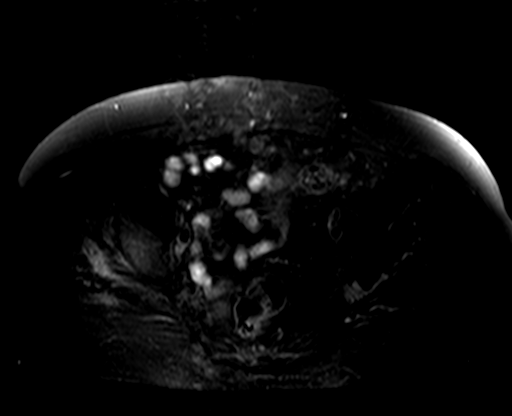
[im 40/40]
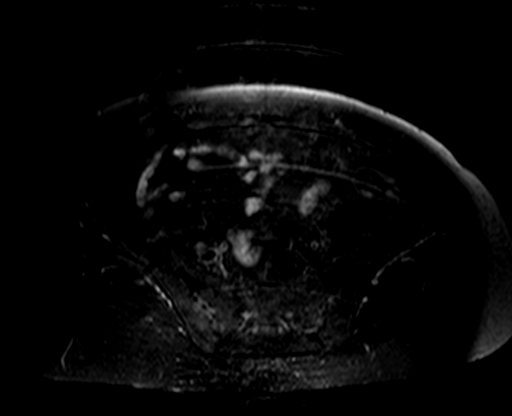

[Series 6: PD fat-sat · sagittal · 4.0mm · 0.35mm/px · 5 of 35 slices shown (1 of 2)]
[im 1/35]
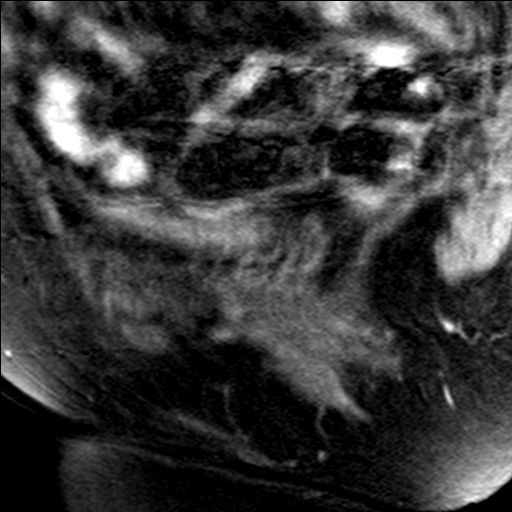
[im 9/35]
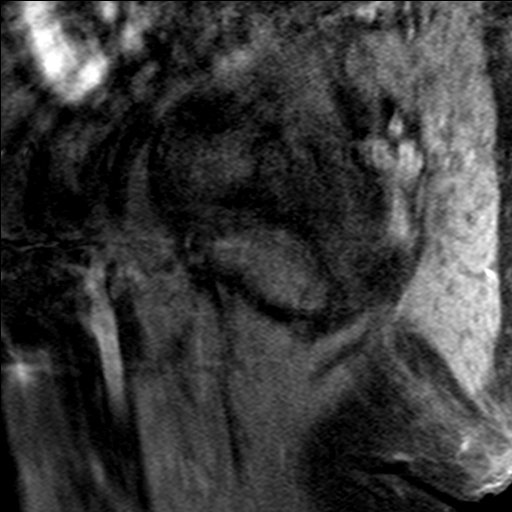
[im 18/35]
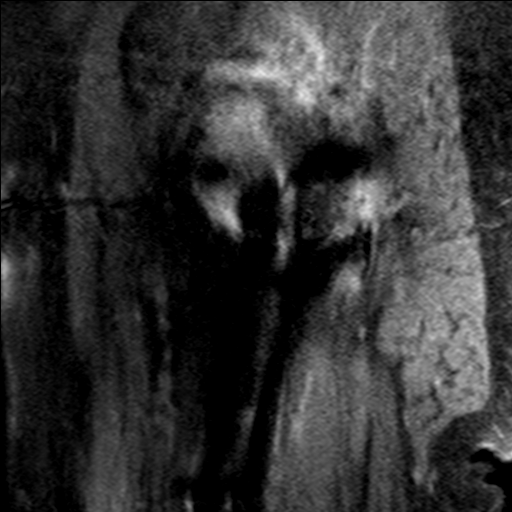
[im 26/35]
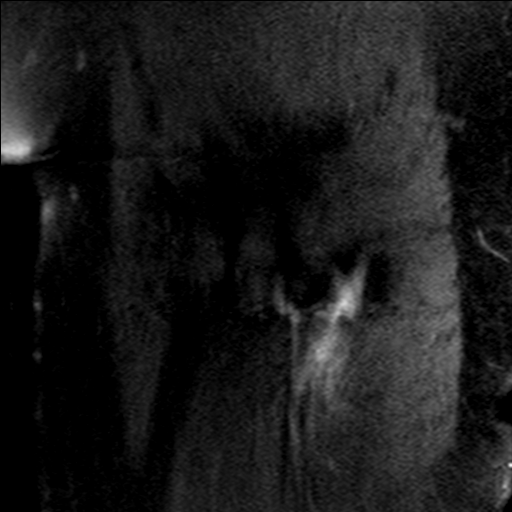
[im 35/35]
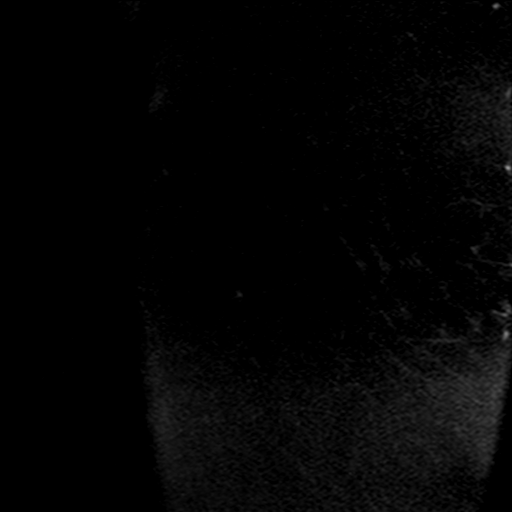

[Series 8: PD fat-sat · sagittal · 4.0mm · 0.35mm/px · 5 of 35 slices shown (2 of 2)]
[im 1/35]
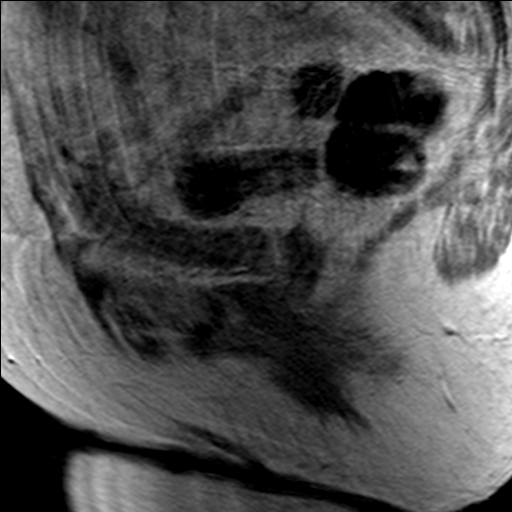
[im 9/35]
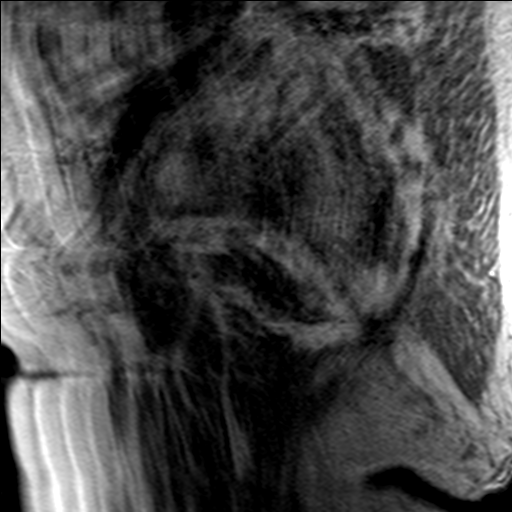
[im 18/35]
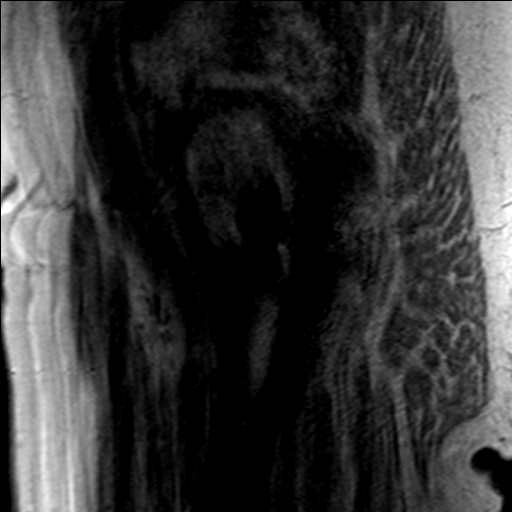
[im 26/35]
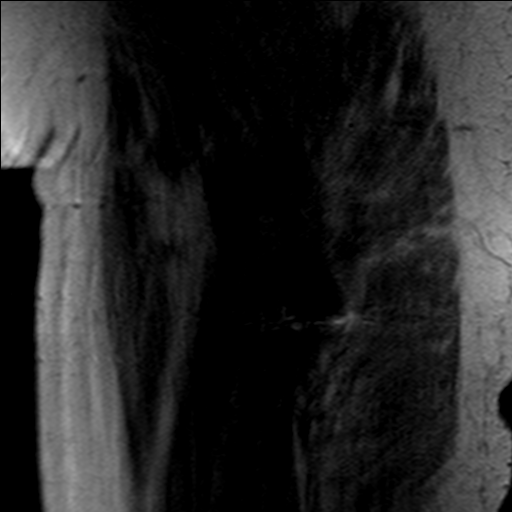
[im 35/35]
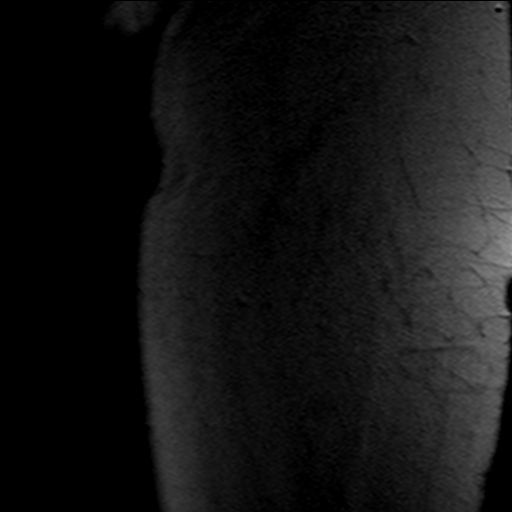

[20 of 40 positions shown; findings below may reference images not displayed]

FINDINGS: Bones: Susceptibility artifact from right femoral nail fixation
across an intratrochanteric fracture of the right femur is
identified. Detail of the fracture, better visualized on the recent
CT is compromised as result. There appears to be marrow edema of the
weight-bearing portion of the femoral head. Curvilinear
hypointensity of the subcortical right femoral head raises the
possibility of avascular necrosis, series 2, image 20. Sub chondral
degenerative cyst of the acetabular roof is seen on the right.

Articular cartilage and labrum

Articular cartilage: Given joint space narrowing of both hips, and
there is likely moderate thinning 10 of the acetabular and femoral
head cartilage bilaterally.

Labrum: No definite labral tear though assessment is limited by lack
of joint fluid.

Joint or bursal effusion

Joint effusion:  No joint effusion

Bursae:  No bursal fluid collections

Muscles and tendons

Muscles and tendons: Mild edema of the right gluteus muscles
possibly representing stigmata of mild strain or tendinopathy.

Other findings

Miscellaneous: The included SI joints are nonacute. The pubic
symphysis is maintained. No sacral insufficiency fracture is
identified. The patient is status post hysterectomy. No adnexal mass
is noted.
IMPRESSION: 1. Limited study due to pre-existing right femoral nail fixation
causing susceptibility artifacts through a known intertrochanteric
fracture of the right femur.
2. Despite susceptibility artifacts, there does appear to be some
evidence of marrow edema along the weight-bearing portion of the
right femoral head. A curvilinear hypointensity seen along involving
the subcortical bone and the possibility of AVN is not excluded.
3. Marrow edema adjacent to the greater trochanter likely
representing mild gluteal muscle strain.

## 2018-10-06 MED ORDER — APIXABAN 2.5 MG PO TABS: 2.5000 mg | ORAL_TABLET | Freq: Two times a day (BID) | ORAL | 3 refills | Status: DC

## 2018-10-06 NOTE — Progress Notes (Signed)
HEMATOLOGY-ONCOLOGY TeleHEALTH VISIT PROGRESS NOTE  I connected with [name] on 10/06/18 at 10:30 AM EDT by video enabled telemedicine visit and verified that I am speaking with the correct person using two identifiers. I discussed the limitations, risks, security and privacy concerns of performing an evaluation and management service by telemedicine and the availability of in-person appointments. I also discussed with the patient that there may be a patient responsible charge related to this service. The patient expressed understanding and agreed to proceed.   Other persons participating in the visit and their role in the encounter:  Geraldine Solar, CMA, check in patient   Patient's daughter who helps patient to set up video visit.  Patient's location: Home  Provider's location: Home office Chief Complaint: Follow-up on management of chronic anticoagulation history of unprovoked DVT and PE.   INTERVAL HISTORY [name] is a 78 y.o. female who has above history reviewed by me today presents for follow up visit for management of chronic anticoagulation and history of unprovoked DVT and PE. Problems and complaints are listed below:  Patient was last seen by me on 07/03/2018.  Then, patient had one ED visit on 08/02/2018 for evaluation of right hip pain.  Patient had right hip x-ray done which showed no acute abnormality.  Was given an injection in the emergency room and discharged home with prednisone.  Follows up with orthopedics. Patient was seen by primary care provider on 08/25/2018, note reviewed. Patient has been on chronic anticoagulation with Eliquis 5 mg twice daily treatment of history of unprovoked DVT and PE.  So far she has tolerated anticoagulation well.Denies hematochezia, hematuria, hematemesis, epistaxis, black tarry stool or easy bruising.  She reports that she continues to have chronic lower extremity soreness. Today she also reports a mild to moderate headache for the  past couple of days, she takes Tylenol and gets some symptom relief.  Denies any nausea, vomiting, fever, chills, chest pain, abdominal pain. Chronic fatigue at baseline.  Review of Systems  Constitutional: Positive for fatigue. Negative for appetite change, chills and fever.  HENT:   Negative for hearing loss and voice change.   Eyes: Negative for eye problems.  Respiratory: Negative for chest tightness and cough.   Cardiovascular: Negative for chest pain.  Gastrointestinal: Negative for abdominal distention, abdominal pain and blood in stool.  Endocrine: Negative for hot flashes.  Genitourinary: Negative for difficulty urinating and frequency.   Musculoskeletal: Positive for arthralgias.  Skin: Negative for itching and rash.  Neurological: Positive for headaches. Negative for extremity weakness.  Hematological: Negative for adenopathy.  Psychiatric/Behavioral: Negative for confusion.    Past Medical History:  Diagnosis Date  . Acute postoperative pain 02/03/2018  . Anemia   . B12 deficiency   . Bacteremia 10/07/2014  . Bladder incontinence   . Blind left eye   . Cancer ([place])    skin cancer   . Cataract   . Cervical spine fracture ([place])   . Chest pain 07/29/2013  . Compression fracture    C7,  upper T spine -compression fx  . COPD (chronic obstructive pulmonary disease) ([place])   . DDD (degenerative disc disease), lumbar   . Depression    major  . Diffuse myofascial pain syndrome 03/24/2015  . Dyspnea    at times- when activty  . Fever 10/05/2014  . GERD (gastroesophageal reflux disease)   . Hiatal hernia   . Hyperlipidemia   . Hypertension   . 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  Macular degeneration   . On home oxygen therapy    "3L; all the time" (04/30/2018)  . Osteoarthritis   . Osteoporosis   . Pneumonia    years ago  . Pulmonary embolism ([place]) 04/30/2018  . Reactive airway disease   . Rupture of bowel ([place])   . Sepsis ([place]) 10/08/2014  . Stroke  [facility])    "mini stroke"- balance and memory issue  . Stroke ([place])   . Wrist pain, acute 09/10/2012   Past Surgical History:  Procedure Laterality Date  . ABDOMINAL HYSTERECTOMY    . ABDOMINAL SURGERY    . APPENDECTOMY    . BLADDER SURGERY    . CATARACT EXTRACTION W/ INTRAOCULAR LENS  IMPLANT, BILATERAL Bilateral   . CHOLECYSTECTOMY    . COLON SURGERY    . COLOSTOMY REVERSAL    . INTRAMEDULLARY (IM) NAIL INTERTROCHANTERIC Right 09/13/2016   Procedure: INTRAMEDULLARY (IM) NAIL INTERTROCHANTRIC RIGHT;  Surgeon: Leandrew Koyanagi, MD;  Location: [facility];  Service: Orthopedics;  Laterality: Right;  . JOINT REPLACEMENT Bilateral   . KNEE SURGERY    . OSTOMY    . RECTOCELE REPAIR    . TOE SURGERY Right    3rd  . TOTAL HIP ARTHROPLASTY Right 09/30/2017   Procedure: RIGHT HIP IM NAIL REMOVAL WITH CONVERSION TO TOTAL HIP ARTHOPLASTY, anterior approach;  Surgeon: Renette Butters, MD;  Location: [facility];  Service: Orthopedics;  Laterality: Right;    Family History  Problem Relation Age of Onset  . Heart failure Mother   . Heart attack Father   . Lung cancer Sister   . Deep vein thrombosis Sister   . Pulmonary embolism Son   . Deep vein thrombosis Son   . Cervical cancer Daughter     Social History   Socioeconomic History  . Marital status: Widowed    Spouse name: Not on file  . Number of children: 3  . Years of education: middle sch  . Highest education level: Not on file  Occupational History  . Occupation: retired    Comment: n/a  Social Needs  . Financial resource strain: Not on file  . Food insecurity:    Worry: Not on file    Inability: Not on file  . Transportation needs:    Medical: Not on file    Non-medical: Not on file  Tobacco Use  . Smoking status: Former Smoker    Packs/day: 1.50    Years: 30.00    Pack years: 45.00    Types: E-cigarettes, Cigarettes    Last attempt to quit: 2013    Years since quitting: 7.3  . Smokeless tobacco: Never Used  Substance and Sexual  Activity  . Alcohol use: No    Alcohol/week: 0.0 standard drinks  . Drug use: No  . Sexual activity: Not Currently  Lifestyle  . Physical activity:    Days per week: Not on file    Minutes per session: Not on file  . Stress: Not on file  Relationships  . Social connections:    Talks on phone: Not on file    Gets together: Not on file    Attends religious service: Not on file    Active member of club or organization: Not on file    Attends meetings of clubs or organizations: Not on file    Relationship status: Not on file  . Intimate partner violence:    Fear of current or ex partner: Not on file    Emotionally abused:  Not on file    Physically abused: Not on file    Forced sexual activity: Not on file  Other Topics Concern  . Not on file  Social History Narrative   ** Merged History Encounter **       Patient lives at home with daughter. Caffeine Use: 4 cups daily    Current Outpatient Medications on File Prior to Visit  Medication Sig Dispense Refill  . acetaminophen (TYLENOL) 500 MG tablet Take 2 tablets (1,000 mg total) by mouth 3 (three) times daily. 30 tablet 0  . albuterol (ACCUNEB) 1.25 MG/3ML nebulizer solution Take 3 mLs (1.25 mg total) by nebulization every 6 (six) hours as needed for wheezing. 75 mL 1  . albuterol (VENTOLIN HFA) 108 (90 Base) MCG/ACT inhaler Inhale 2 puffs into the lungs every 6 (six) hours as needed for wheezing or shortness of breath. 1 Inhaler 2  . alendronate (FOSAMAX) 70 MG tablet Take 70 mg by mouth every Monday. Take with a full glass of water on an empty stomach.    . ALPRAZolam (XANAX) 1 MG tablet TAKE 1 TABLET (1 MG TOTAL) BY MOUTH AT BEDTIME AS NEEDED FOR SLEEP. 30 tablet 1  . amLODipine (NORVASC) 5 MG tablet TAKE 1 TABLET BY MOUTH EVERY DAY 90 tablet 1  . Cholecalciferol (VITAMIN D-3 PO) Take 2,000 Units by mouth daily.    . citalopram (CELEXA) 20 MG tablet Take 1.5 tablets (30 mg total) by mouth daily. 135 tablet 1  . docusate sodium  (COLACE) 100 MG capsule Take 200 mg by mouth 2 (two) times daily as needed for moderate constipation.     Marland Kitchen ELIQUIS 5 MG TABS tablet TAKE 1 TABLET (5 MG TOTAL) BY MOUTH 2 (TWO) TIMES DAILY. START AFTER 10MG TWICE DAY DOSING 60 tablet 2  . esomeprazole (NEXIUM) 40 MG capsule Take 40 mg by mouth daily.     . fluticasone (FLOVENT HFA) 220 MCG/ACT inhaler Inhale 1 puff into the lungs 2 (two) times daily. 1 Inhaler 11  . Krill Oil 500 MG CAPS Take 500 mg by mouth daily.     Marland Kitchen LINZESS 290 MCG CAPS capsule TAKE 1 CAPSULE (290 MCG TOTAL) BY MOUTH EVERY MORNING BEFORE BREAKFAST KEEP IN ORIGINAL BOTTLE. 90 capsule 1  . lovastatin (MEVACOR) 10 MG tablet Take 1 tablet (10 mg total) by mouth daily. 90 tablet 3  . ondansetron (ZOFRAN ODT) 4 MG disintegrating tablet Take 1 tablet (4 mg total) by mouth every 8 (eight) hours as needed for nausea or vomiting. 30 tablet 0  . polyethylene glycol (MIRALAX / GLYCOLAX) packet Take 17 g by mouth daily as needed (for constipation.).     Marland Kitchen pregabalin (LYRICA) 100 MG capsule Take 1 capsule (100 mg total) by mouth 2 (two) times daily. 60 capsule 5  . vitamin B-12 (CYANOCOBALAMIN) 500 MCG tablet Take 500 mcg by mouth daily.    Marland Kitchen NARCAN 4 MG/0.1ML LIQD nasal spray kit     . Oxycodone HCl 10 MG TABS Take 10 mg by mouth 3 (three) times daily.     No current facility-administered medications on file prior to visit.     Allergies  Allergen Reactions  . Ampicillin Anaphylaxis, Nausea Only and Swelling    SEVERE HEADACHE MUSCLE CRAMPS ANGIOEDEMA THRUSH PATIENT HAS HAD A PCN REACTION WITH IMMEDIATE RASH, FACIAL/TONGUE/THROAT SWELLING, SOB, OR LIGHTHEADEDNESS WITH HYPOTENSION:  #  #  YES  #  #  Has patient had a PCN reaction causing severe rash involving mucus membranes  or skin necrosis: No Has patient had a PCN reaction that required hospitalization:No Has patient had a PCN reaction occurring within the last 10 years: No.  . Ampicillin-Sulbactam Sodium Anaphylaxis and Other  (See Comments)    SEVERE HEADACHE MUSCLE CRAMPS ANGIOEDEMA THRUSH PATIENT HAS HAD A PCN REACTION WITH IMMEDIATE RASH, FACIAL/TONGUE/THROAT SWELLING, SOB, OR LIGHTHEADEDNESS WITH HYPOTENSION: # # YES # # Has patient had a PCN reaction causing severe rash involving mucus membranes or skin necrosis: No Has patient had a PCN reaction that required hospitalization:No Has patient had a PCN reaction occurring within the last 10 years: No   . Ambien [Zolpidem Tartrate] Nausea And Vomiting and Other (See Comments)    HALLUCINATIONS SWEATING  . Flexeril [Cyclobenzaprine] Other (See Comments)    EXTRAPYRAMIDAL MOVEMENT INVOLUNTARY MUSCLE JERKING  . Tape Itching, Rash and Other (See Comments)    Paper tape only. Adhesive tape=itching/burning/rash  . Toradol [Ketorolac Tromethamine] Nausea Only and Other (See Comments)    HEADACHE  BACKACHE  . Sulfamethoxazole-Trimethoprim     UNSPECIFIED REACTION   . Zolpidem     Hallucinations  . Cephalexin Rash and Other (See Comments)    HEADACHES  . Tramadol Rash and Other (See Comments)    HEADACHE        Observations/Objective: There were no vitals filed for this visit. There is no height or weight on file to calculate BMI.  Physical Exam  Constitutional: She is oriented to person, place, and time. No distress.  Frail female, sitting in her couch.  HENT:  Head: Atraumatic.  Pulmonary/Chest: Effort normal.  Neurological: She is alert and oriented to person, place, and time.  Psychiatric: Affect normal.    CBC    Component Value Date/Time   WBC 5.4 10/01/2018 1118   RBC 4.35 10/01/2018 1118   HGB 12.2 10/01/2018 1118   HGB 14.7 12/04/2016 1525   HCT 39.5 10/01/2018 1118   HCT 44.8 12/04/2016 1525   PLT 210 10/01/2018 1118   PLT 263 12/04/2016 1525   MCV 90.8 10/01/2018 1118   MCV 92 12/04/2016 1525   MCH 28.0 10/01/2018 1118   MCHC 30.9 10/01/2018 1118   RDW 14.6 10/01/2018 1118   RDW 14.4 12/04/2016 1525   LYMPHSABS 1.9  10/01/2018 1118   LYMPHSABS 2.6 12/04/2016 1525   MONOABS 0.8 10/01/2018 1118   EOSABS 0.3 10/01/2018 1118   EOSABS 0.3 12/04/2016 1525   BASOSABS 0.1 10/01/2018 1118   BASOSABS 0.0 12/04/2016 1525    CMP     Component Value Date/Time   NA 139 05/17/2018 1126   NA 145 (H) 12/04/2016 1525   K 3.6 05/17/2018 1126   CL 106 05/17/2018 1126   CO2 24 05/17/2018 1126   GLUCOSE 65 (L) 05/17/2018 1126   BUN 7 (L) 05/17/2018 1126   BUN 7 (L) 12/04/2016 1525   CREATININE 0.94 05/17/2018 1126   CALCIUM 8.9 05/17/2018 1126   PROT 7.2 05/17/2018 1126   PROT 7.7 12/04/2016 1525   ALBUMIN 3.0 (L) 05/17/2018 1126   ALBUMIN 4.0 12/04/2016 1525   AST 26 05/17/2018 1126   ALT 12 05/17/2018 1126   ALKPHOS 51 05/17/2018 1126   BILITOT 0.6 05/17/2018 1126   BILITOT 0.2 12/04/2016 1525   GFRNONAA 59 (L) 05/17/2018 1126   GFRAA >60 05/17/2018 1126     Assessment and Plan: 1. PE (pulmonary thromboembolism) ([place])   2. 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 Family history of DVT  Labs are reviewed and discussed with patient. Clinically she tolerates chronic anticoagulation with Eliquis 5 mg twice daily.  No bleeding events. 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  She will be finishing 6 months of anticoagulation in June 2020. I recommend patient to repeat bilateral lower extremity venous duplex for evaluation. With her advanced age, frail status fall risk, I recommend patient to finish her current supply of Eliquis 5 mg twice daily and then decrease to Eliquis 2.5 mg twice daily starting June 2020.  New prescription sent to pharmacy.  Family history of DVT and personal history of DVT.  Negative factor V Leiden mutation and prothrombin gene mutation.  Hold additional work-up since this would not change her anticoagulation management.  Discussed with patient and her daughter.  Continue follow-up  with primary care physician for other medical problems.  Follow Up Instructions: Lab MD 3 months.  Orders Placed This Encounter  Procedures  . US Venous Img Lower Bilateral    Standing Status:   Future    Standing Expiration Date:   10/06/2019    Order Specific Question:   Reason for Exam (SYMPTOM  OR DIAGNOSIS REQUIRED)    Answer:   LLE dVT follo wup    Order Specific Question:   Preferred imaging location?    Answer:   [facility]  . Comprehensive metabolic panel    Standing Status:   Future    Standing Expiration Date:   10/06/2019  . CBC with Differential/Platelet    Standing Status:   Future    Standing Expiration Date:   10/06/2019     I discussed the assessment and treatment plan with the patient. The patient was provided an opportunity to ask questions and all were answered. The patient agreed with the plan and demonstrated an understanding of the instructions.  The patient was advised to call back or seek an in-person evaluation if the symptoms worsen or if the condition fails to improve as anticipated.   I provided 15 minutes of face-to-face video visit time during this encounter, and > 50% was spent counseling as documented under my assessment & plan.  Earlie Server, MD 10/06/2018 9:38 PM

## 2018-10-06 NOTE — Progress Notes (Signed)
Called patient today for Televisit via [place].  Review of medications and chart were helped by patients daughter, Juliann Pulse, with verbal approval by patient.

## 2018-10-13 ENCOUNTER — Encounter: Payer: Self-pay | Admitting: Family Medicine

## 2018-10-26 ENCOUNTER — Encounter: Payer: Self-pay | Admitting: Family Medicine

## 2018-10-29 ENCOUNTER — Emergency Department: Payer: Medicare Other

## 2018-10-29 ENCOUNTER — Observation Stay
Admission: EM | Admit: 2018-10-29 | Discharge: 2018-10-30 | Disposition: A | Discharge status: Home / Self Care | Payer: Medicare Other | Attending: Specialist | Admitting: Specialist

## 2018-10-29 ENCOUNTER — Encounter: Payer: Self-pay | Admitting: Family Medicine

## 2018-10-29 ENCOUNTER — Other Ambulatory Visit: Payer: Self-pay

## 2018-10-29 ENCOUNTER — Encounter: Payer: Self-pay | Admitting: Emergency Medicine

## 2018-10-29 DIAGNOSIS — Z96698 Presence of other orthopedic joint implants: Secondary | ICD-10-CM | POA: Insufficient documentation

## 2018-10-29 DIAGNOSIS — Z880 Allergy status to penicillin: Secondary | ICD-10-CM | POA: Insufficient documentation

## 2018-10-29 DIAGNOSIS — Z8249 Family history of ischemic heart disease and other diseases of the circulatory system: Secondary | ICD-10-CM | POA: Insufficient documentation

## 2018-10-29 DIAGNOSIS — H5462 Unqualified visual loss, left eye, normal vision right eye: Secondary | ICD-10-CM | POA: Diagnosis not present

## 2018-10-29 DIAGNOSIS — Z86711 Personal history of pulmonary embolism: Secondary | ICD-10-CM | POA: Diagnosis not present

## 2018-10-29 DIAGNOSIS — K922 Gastrointestinal hemorrhage, unspecified: Secondary | ICD-10-CM | POA: Diagnosis present

## 2018-10-29 DIAGNOSIS — Z7951 Long term (current) use of inhaled steroids: Secondary | ICD-10-CM | POA: Diagnosis not present

## 2018-10-29 DIAGNOSIS — M5136 Other intervertebral disc degeneration, lumbar region: Secondary | ICD-10-CM | POA: Insufficient documentation

## 2018-10-29 DIAGNOSIS — M810 Age-related osteoporosis without current pathological fracture: Secondary | ICD-10-CM | POA: Diagnosis not present

## 2018-10-29 DIAGNOSIS — Z882 Allergy status to sulfonamides status: Secondary | ICD-10-CM | POA: Insufficient documentation

## 2018-10-29 DIAGNOSIS — Z9841 Cataract extraction status, right eye: Secondary | ICD-10-CM | POA: Insufficient documentation

## 2018-10-29 DIAGNOSIS — Z87891 Personal history of nicotine dependence: Secondary | ICD-10-CM | POA: Insufficient documentation

## 2018-10-29 DIAGNOSIS — Z85828 Personal history of other malignant neoplasm of skin: Secondary | ICD-10-CM | POA: Insufficient documentation

## 2018-10-29 DIAGNOSIS — K449 Diaphragmatic hernia without obstruction or gangrene: Secondary | ICD-10-CM | POA: Diagnosis not present

## 2018-10-29 DIAGNOSIS — K219 Gastro-esophageal reflux disease without esophagitis: Secondary | ICD-10-CM | POA: Insufficient documentation

## 2018-10-29 DIAGNOSIS — H3530 Unspecified macular degeneration: Secondary | ICD-10-CM | POA: Diagnosis not present

## 2018-10-29 DIAGNOSIS — Z86718 Personal history of other venous thrombosis and embolism: Secondary | ICD-10-CM | POA: Diagnosis not present

## 2018-10-29 DIAGNOSIS — F329 Major depressive disorder, single episode, unspecified: Secondary | ICD-10-CM | POA: Insufficient documentation

## 2018-10-29 DIAGNOSIS — Z79899 Other long term (current) drug therapy: Secondary | ICD-10-CM | POA: Insufficient documentation

## 2018-10-29 DIAGNOSIS — M7918 Myalgia, other site: Secondary | ICD-10-CM | POA: Insufficient documentation

## 2018-10-29 DIAGNOSIS — J441 Chronic obstructive pulmonary disease with (acute) exacerbation: Secondary | ICD-10-CM | POA: Diagnosis not present

## 2018-10-29 DIAGNOSIS — K920 Hematemesis: Principal | ICD-10-CM | POA: Insufficient documentation

## 2018-10-29 DIAGNOSIS — Z1159 Encounter for screening for other viral diseases: Secondary | ICD-10-CM | POA: Diagnosis not present

## 2018-10-29 DIAGNOSIS — D631 Anemia in chronic kidney disease: Secondary | ICD-10-CM | POA: Insufficient documentation

## 2018-10-29 DIAGNOSIS — Z889 Allergy status to unspecified drugs, medicaments and biological substances status: Secondary | ICD-10-CM | POA: Insufficient documentation

## 2018-10-29 DIAGNOSIS — Z8049 Family history of malignant neoplasm of other genital organs: Secondary | ICD-10-CM | POA: Insufficient documentation

## 2018-10-29 DIAGNOSIS — M1990 Unspecified osteoarthritis, unspecified site: Secondary | ICD-10-CM | POA: Diagnosis not present

## 2018-10-29 DIAGNOSIS — N183 Chronic kidney disease, stage 3 (moderate): Secondary | ICD-10-CM | POA: Insufficient documentation

## 2018-10-29 DIAGNOSIS — Z9842 Cataract extraction status, left eye: Secondary | ICD-10-CM | POA: Insufficient documentation

## 2018-10-29 DIAGNOSIS — M542 Cervicalgia: Secondary | ICD-10-CM | POA: Insufficient documentation

## 2018-10-29 DIAGNOSIS — R32 Unspecified urinary incontinence: Secondary | ICD-10-CM | POA: Insufficient documentation

## 2018-10-29 DIAGNOSIS — Z885 Allergy status to narcotic agent status: Secondary | ICD-10-CM | POA: Insufficient documentation

## 2018-10-29 DIAGNOSIS — Z9981 Dependence on supplemental oxygen: Secondary | ICD-10-CM | POA: Insufficient documentation

## 2018-10-29 DIAGNOSIS — Z7901 Long term (current) use of anticoagulants: Secondary | ICD-10-CM | POA: Insufficient documentation

## 2018-10-29 DIAGNOSIS — Z79891 Long term (current) use of opiate analgesic: Secondary | ICD-10-CM | POA: Diagnosis not present

## 2018-10-29 DIAGNOSIS — Z801 Family history of malignant neoplasm of trachea, bronchus and lung: Secondary | ICD-10-CM | POA: Insufficient documentation

## 2018-10-29 DIAGNOSIS — F419 Anxiety disorder, unspecified: Secondary | ICD-10-CM | POA: Diagnosis not present

## 2018-10-29 DIAGNOSIS — Z888 Allergy status to other drugs, medicaments and biological substances status: Secondary | ICD-10-CM | POA: Insufficient documentation

## 2018-10-29 DIAGNOSIS — I129 Hypertensive chronic kidney disease with stage 1 through stage 4 chronic kidney disease, or unspecified chronic kidney disease: Secondary | ICD-10-CM | POA: Diagnosis not present

## 2018-10-29 DIAGNOSIS — Z8673 Personal history of transient ischemic attack (TIA), and cerebral infarction without residual deficits: Secondary | ICD-10-CM | POA: Diagnosis not present

## 2018-10-29 DIAGNOSIS — Z881 Allergy status to other antibiotic agents status: Secondary | ICD-10-CM | POA: Insufficient documentation

## 2018-10-29 DIAGNOSIS — E785 Hyperlipidemia, unspecified: Secondary | ICD-10-CM | POA: Insufficient documentation

## 2018-10-29 DIAGNOSIS — Z9049 Acquired absence of other specified parts of digestive tract: Secondary | ICD-10-CM | POA: Insufficient documentation

## 2018-10-29 DIAGNOSIS — Z96641 Presence of right artificial hip joint: Secondary | ICD-10-CM | POA: Insufficient documentation

## 2018-10-29 LAB — CBC WITH DIFFERENTIAL/PLATELET
Abs Immature Granulocytes: 0.04 10*3/uL (ref 0.00–0.07)
Basophils Absolute: 0.1 10*3/uL (ref 0.0–0.1)
Basophils Relative: 0 %
Eosinophils Absolute: 0.1 10*3/uL (ref 0.0–0.5)
Eosinophils Relative: 1 %
HCT: 40.8 % (ref 36.0–46.0)
Hemoglobin: 12.4 g/dL (ref 12.0–15.0)
Immature Granulocytes: 0 %
Lymphocytes Relative: 12 %
Lymphs Abs: 1.4 10*3/uL (ref 0.7–4.0)
MCH: 27.4 pg (ref 26.0–34.0)
MCHC: 30.4 g/dL (ref 30.0–36.0)
MCV: 90.1 fL (ref 80.0–100.0)
Monocytes Absolute: 0.9 10*3/uL (ref 0.1–1.0)
Monocytes Relative: 8 %
Neutro Abs: 9.3 10*3/uL — ABNORMAL HIGH (ref 1.7–7.7)
Neutrophils Relative %: 79 %
Platelets: 224 10*3/uL (ref 150–400)
RBC: 4.53 MIL/uL (ref 3.87–5.11)
RDW: 15.9 % — ABNORMAL HIGH (ref 11.5–15.5)
WBC: 11.8 10*3/uL — ABNORMAL HIGH (ref 4.0–10.5)
nRBC: 0 % (ref 0.0–0.2)

## 2018-10-29 LAB — COMPREHENSIVE METABOLIC PANEL
ALT: 15 U/L (ref 0–44)
AST: 25 U/L (ref 15–41)
Albumin: 3.5 g/dL (ref 3.5–5.0)
Alkaline Phosphatase: 86 U/L (ref 38–126)
Anion gap: 12 (ref 5–15)
BUN: 8 mg/dL (ref 8–23)
CO2: 21 mmol/L — ABNORMAL LOW (ref 22–32)
Calcium: 9.6 mg/dL (ref 8.9–10.3)
Chloride: 111 mmol/L (ref 98–111)
Creatinine, Ser: 0.98 mg/dL (ref 0.44–1.00)
GFR calc Af Amer: >60 mL/min (ref >60)
GFR calc non Af Amer: 56 mL/min — ABNORMAL LOW (ref >60)
Glucose, Bld: 106 mg/dL — ABNORMAL HIGH (ref 70–99)
Potassium: 3.4 mmol/L — ABNORMAL LOW (ref 3.5–5.1)
Sodium: 144 mmol/L (ref 135–145)
Total Bilirubin: 0.6 mg/dL (ref 0.3–1.2)
Total Protein: 7.5 g/dL (ref 6.5–8.1)

## 2018-10-29 LAB — PROTIME-INR
INR: 1.1 (ref 0.8–1.2)
Prothrombin Time: 14 seconds (ref 11.4–15.2)

## 2018-10-29 LAB — IRON AND TIBC
Iron: 45 ug/dL (ref 28–170)
Saturation Ratios: 13 % (ref 10.4–31.8)
TIBC: 336 ug/dL (ref 250–450)
UIBC: 291 ug/dL

## 2018-10-29 LAB — SARS CORONAVIRUS 2 BY RT PCR (HOSPITAL ORDER, PERFORMED IN ~~LOC~~ HOSPITAL LAB): SARS Coronavirus 2: NEGATIVE

## 2018-10-29 LAB — VITAMIN B12: Vitamin B-12: 271 pg/mL (ref 180–914)

## 2018-10-29 LAB — TROPONIN I: Troponin I: <0.03 ng/mL (ref <0.03)

## 2018-10-29 LAB — LIPASE, BLOOD: Lipase: 25 U/L (ref 11–51)

## 2018-10-29 LAB — FOLATE: Folate: 13 ng/mL (ref >5.9)

## 2018-10-29 LAB — FERRITIN: Ferritin: 17 ng/mL (ref 11–307)

## 2018-10-29 MED ORDER — SODIUM CHLORIDE 0.9 % IV SOLN
8.0000 mg/h | INTRAVENOUS | Status: DC
  Administered 2018-10-29 (×2): 8 mg/h via INTRAVENOUS
  Filled 2018-10-29 (×3): qty 80

## 2018-10-29 MED ORDER — CITALOPRAM HYDROBROMIDE 20 MG PO TABS
30.0000 mg | ORAL_TABLET | Freq: Every day | ORAL | Status: DC
  Administered 2018-10-29 – 2018-10-30 (×2): 30 mg via ORAL
  Filled 2018-10-29 (×2): qty 2

## 2018-10-29 MED ORDER — PRAVASTATIN SODIUM 20 MG PO TABS
10.0000 mg | ORAL_TABLET | Freq: Every day | ORAL | Status: DC
  Administered 2018-10-29: 10 mg via ORAL
  Filled 2018-10-29: qty 1

## 2018-10-29 MED ORDER — SODIUM CHLORIDE 0.9 % IV SOLN
INTRAVENOUS | Status: DC
  Administered 2018-10-29 – 2018-10-30 (×4): via INTRAVENOUS

## 2018-10-29 MED ORDER — CYANOCOBALAMIN 500 MCG PO TABS
500.0000 ug | ORAL_TABLET | Freq: Every day | ORAL | Status: DC
  Filled 2018-10-29 (×2): qty 1

## 2018-10-29 MED ORDER — ALBUTEROL SULFATE HFA 108 (90 BASE) MCG/ACT IN AERS: 2.0000 | INHALATION_SPRAY | Freq: Four times a day (QID) | RESPIRATORY_TRACT | Status: DC | PRN

## 2018-10-29 MED ORDER — ONDANSETRON HCL 4 MG/2ML IJ SOLN: 4.0000 mg | Freq: Four times a day (QID) | INTRAMUSCULAR | Status: DC | PRN

## 2018-10-29 MED ORDER — POLYETHYLENE GLYCOL 3350 17 G PO PACK: 17.0000 g | PACK | Freq: Every day | ORAL | Status: DC | PRN

## 2018-10-29 MED ORDER — OXYCODONE HCL 5 MG PO TABS
10.0000 mg | ORAL_TABLET | Freq: Three times a day (TID) | ORAL | Status: DC
  Administered 2018-10-29 – 2018-10-30 (×3): 10 mg via ORAL
  Filled 2018-10-29 (×4): qty 2

## 2018-10-29 MED ORDER — ONDANSETRON HCL 4 MG PO TABS: 4.0000 mg | ORAL_TABLET | Freq: Four times a day (QID) | ORAL | Status: DC | PRN

## 2018-10-29 MED ORDER — MORPHINE SULFATE (PF) 4 MG/ML IV SOLN
INTRAVENOUS | Status: AC
  Administered 2018-10-29: 13:00:00
  Filled 2018-10-29: qty 1

## 2018-10-29 MED ORDER — AMLODIPINE BESYLATE 5 MG PO TABS
5.0000 mg | ORAL_TABLET | Freq: Every day | ORAL | Status: DC
  Administered 2018-10-29 – 2018-10-30 (×2): 5 mg via ORAL
  Filled 2018-10-29 (×2): qty 1

## 2018-10-29 MED ORDER — LINACLOTIDE 290 MCG PO CAPS
290.0000 ug | ORAL_CAPSULE | Freq: Every day | ORAL | Status: DC
  Filled 2018-10-29: qty 1

## 2018-10-29 MED ORDER — PANTOPRAZOLE SODIUM 40 MG IV SOLR: 40.0000 mg | Freq: Two times a day (BID) | INTRAVENOUS | Status: DC

## 2018-10-29 MED ORDER — PREGABALIN 50 MG PO CAPS
100.0000 mg | ORAL_CAPSULE | Freq: Two times a day (BID) | ORAL | Status: DC
  Administered 2018-10-29: 100 mg via ORAL
  Filled 2018-10-29: qty 2

## 2018-10-29 MED ORDER — ALPRAZOLAM 0.5 MG PO TABS: 1.0000 mg | ORAL_TABLET | Freq: Every evening | ORAL | Status: DC | PRN

## 2018-10-29 MED ORDER — ACETAMINOPHEN 325 MG PO TABS
650.0000 mg | ORAL_TABLET | Freq: Four times a day (QID) | ORAL | Status: DC | PRN
  Administered 2018-10-30: 650 mg via ORAL
  Filled 2018-10-29: qty 2

## 2018-10-29 MED ORDER — ACETAMINOPHEN 650 MG RE SUPP: 650.0000 mg | Freq: Four times a day (QID) | RECTAL | Status: DC | PRN

## 2018-10-29 MED ORDER — ALBUTEROL SULFATE (2.5 MG/3ML) 0.083% IN NEBU: 3.0000 mL | INHALATION_SOLUTION | Freq: Four times a day (QID) | RESPIRATORY_TRACT | Status: DC | PRN

## 2018-10-29 MED ORDER — SODIUM CHLORIDE 0.9 % IV SOLN
80.0000 mg | Freq: Once | INTRAVENOUS | Status: AC
  Administered 2018-10-29: 80 mg via INTRAVENOUS
  Filled 2018-10-29: qty 80

## 2018-10-29 NOTE — ED Notes (Signed)
ED TO INPATIENT HANDOFF REPORT  ED Nurse Name and Phone #: Gwenette Greet [phone]  S Name/Age/Gender [name] 78 y.o. female Room/Bed: ED05A/ED05A  Code Status   Code Status: Prior  Home/SNF/Other Home Patient oriented to: self, place, time and situation Is this baseline? Yes   Triage Complete: Triage complete  Chief Complaint vomiting blood  Triage Note See downtime forms for triage.    Allergies Allergies  Allergen Reactions  . Ampicillin Anaphylaxis, Nausea Only and Swelling    SEVERE HEADACHE MUSCLE CRAMPS ANGIOEDEMA THRUSH PATIENT HAS HAD A PCN REACTION WITH IMMEDIATE RASH, FACIAL/TONGUE/THROAT SWELLING, SOB, OR LIGHTHEADEDNESS WITH HYPOTENSION:  #  #  YES  #  #  Has patient had a PCN reaction causing severe rash involving mucus membranes or skin necrosis: No Has patient had a PCN reaction that required hospitalization:No Has patient had a PCN reaction occurring within the last 10 years: No.  . Ampicillin-Sulbactam Sodium Anaphylaxis and Other (See Comments)    SEVERE HEADACHE MUSCLE CRAMPS ANGIOEDEMA THRUSH PATIENT HAS HAD A PCN REACTION WITH IMMEDIATE RASH, FACIAL/TONGUE/THROAT SWELLING, SOB, OR LIGHTHEADEDNESS WITH HYPOTENSION: # # YES # # Has patient had a PCN reaction causing severe rash involving mucus membranes or skin necrosis: No Has patient had a PCN reaction that required hospitalization:No Has patient had a PCN reaction occurring within the last 10 years: No   . Ambien [Zolpidem Tartrate] Nausea And Vomiting and Other (See Comments)    HALLUCINATIONS SWEATING  . Flexeril [Cyclobenzaprine] Other (See Comments)    EXTRAPYRAMIDAL MOVEMENT INVOLUNTARY MUSCLE JERKING  . Tape Itching, Rash and Other (See Comments)    Paper tape only. Adhesive tape=itching/burning/rash  . Toradol [Ketorolac Tromethamine] Nausea Only and Other (See Comments)    HEADACHE  BACKACHE  . Sulfamethoxazole-Trimethoprim     UNSPECIFIED REACTION   . Zolpidem    Hallucinations  . Cephalexin Rash and Other (See Comments)    HEADACHES  . Tramadol Rash and Other (See Comments)    HEADACHE     Level of Care/Admitting Diagnosis ED Disposition    ED Disposition Condition Comment   Admit  The patient appears reasonably stabilized for admission considering the current resources, flow, and capabilities available in the ED at this time, and I doubt any other [facility] requiring further screening and/or treatment in the ED prior to admission is  present.       B Medical/Surgery History Past Medical History:  Diagnosis Date  . Acute postoperative pain 02/03/2018  . Anemia   . B12 deficiency   . Bacteremia 10/07/2014  . Bladder incontinence   . Blind left eye   . Cancer ([place])    skin cancer   . Cataract   . Cervical spine fracture ([place])   . Chest pain 07/29/2013  . Compression fracture    C7,  upper T spine -compression fx  . COPD (chronic obstructive pulmonary disease) ([place])   . DDD (degenerative disc disease), lumbar   . Depression    major  . Diffuse myofascial pain syndrome 03/24/2015  . Dyspnea    at times- when activty  . Fever 10/05/2014  . GERD (gastroesophageal reflux disease)   . Hiatal hernia   . Hyperlipidemia   . Hypertension   . 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 Macular degeneration   . On home oxygen therapy    "3L; all the time" (04/30/2018)  . Osteoarthritis   . Osteoporosis   . Pneumonia    years ago  . Pulmonary embolism (  [place]) 04/30/2018  . Reactive airway disease   . Rupture of bowel ([place])   . Sepsis ([place]) 10/08/2014  . Stroke [facility])    "mini stroke"- balance and memory issue  . Stroke ([place])   . Wrist pain, acute 09/10/2012   Past Surgical History:  Procedure Laterality Date  . ABDOMINAL HYSTERECTOMY    . ABDOMINAL SURGERY    . APPENDECTOMY    . BLADDER SURGERY    . CATARACT EXTRACTION W/ INTRAOCULAR LENS  IMPLANT, BILATERAL Bilateral   . CHOLECYSTECTOMY    . COLON SURGERY    . COLOSTOMY  REVERSAL    . INTRAMEDULLARY (IM) NAIL INTERTROCHANTERIC Right 09/13/2016   Procedure: INTRAMEDULLARY (IM) NAIL INTERTROCHANTRIC RIGHT;  Surgeon: Leandrew Koyanagi, MD;  Location: [place];  Service: Orthopedics;  Laterality: Right;  . JOINT REPLACEMENT Bilateral   . KNEE SURGERY    . OSTOMY    . RECTOCELE REPAIR    . TOE SURGERY Right    3rd  . TOTAL HIP ARTHROPLASTY Right 09/30/2017   Procedure: RIGHT HIP IM NAIL REMOVAL WITH CONVERSION TO TOTAL HIP ARTHOPLASTY, anterior approach;  Surgeon: Renette Butters, MD;  Location: [place];  Service: Orthopedics;  Laterality: Right;     A IV Location/Drains/Wounds Patient Lines/Drains/Airways Status   Active Line/Drains/Airways    Name:   Placement date:   Placement time:   Site:   Days:   Peripheral IV 10/29/18 Right Wrist   10/29/18    -    Wrist   less than 1   Pressure Injury 05/02/18 Deep Tissue Injury - Purple or maroon localized area of discolored intact skin or blood-filled blister due to damage of underlying soft tissue from pressure and/or shear.   05/02/18    0400     180          Intake/Output Last 24 hours No intake or output data in the 24 hours ending 10/29/18 1320  Labs/Imaging Results for orders placed or performed during the hospital encounter of 10/29/18 (from the past 48 hour(s))  CBC with Differential     Status: Abnormal   Collection Time: 10/29/18 10:37 AM  Result Value Ref Range   WBC 11.8 (H) 4.0 - 10.5 K/uL   RBC 4.53 3.87 - 5.11 MIL/uL   Hemoglobin 12.4 12.0 - 15.0 g/dL   HCT 40.8 36.0 - 46.0 %   MCV 90.1 80.0 - 100.0 fL   MCH 27.4 26.0 - 34.0 pg   MCHC 30.4 30.0 - 36.0 g/dL   RDW 15.9 (H) 11.5 - 15.5 %   Platelets 224 150 - 400 K/uL   nRBC 0.0 0.0 - 0.2 %   Neutrophils Relative % 79 %   Neutro Abs 9.3 (H) 1.7 - 7.7 K/uL   Lymphocytes Relative 12 %   Lymphs Abs 1.4 0.7 - 4.0 K/uL   Monocytes Relative 8 %   Monocytes Absolute 0.9 0.1 - 1.0 K/uL   Eosinophils Relative 1 %   Eosinophils Absolute 0.1 0.0 - 0.5  K/uL   Basophils Relative 0 %   Basophils Absolute 0.1 0.0 - 0.1 K/uL   Immature Granulocytes 0 %   Abs Immature Granulocytes 0.04 0.00 - 0.07 K/uL    Comment: Performed at [facility], [address] [place], [place] [address]  Comprehensive metabolic panel     Status: Abnormal   Collection Time: 10/29/18 10:37 AM  Result Value Ref Range   Sodium 144 135 - 145 mmol/L   Potassium 3.4 (  L) 3.5 - 5.1 mmol/L   Chloride 111 98 - 111 mmol/L   CO2 21 (L) 22 - 32 mmol/L   Glucose, Bld 106 (H) 70 - 99 mg/dL   BUN 8 8 - 23 mg/dL   Creatinine, Ser 0.98 0.44 - 1.00 mg/dL   Calcium 9.6 8.9 - 10.3 mg/dL   Total Protein 7.5 6.5 - 8.1 g/dL   Albumin 3.5 3.5 - 5.0 g/dL   AST 25 15 - 41 U/L   ALT 15 0 - 44 U/L   Alkaline Phosphatase 86 38 - 126 U/L   Total Bilirubin 0.6 0.3 - 1.2 mg/dL   GFR calc non Af Amer 56 (L) >60 mL/min   GFR calc Af Amer >60 >60 mL/min   Anion gap 12 5 - 15    Comment: Performed at [facility], [address] [place], [place] [address]  Lipase, blood     Status: None   Collection Time: 10/29/18 10:37 AM  Result Value Ref Range   Lipase 25 11 - 51 U/L    Comment: Performed at [facility], [address] [place], [place] [address]  Protime-INR     Status: None   Collection Time: 10/29/18 10:37 AM  Result Value Ref Range   Prothrombin Time 14.0 11.4 - 15.2 seconds   INR 1.1 0.8 - 1.2    Comment: (NOTE) INR goal varies based on device and disease states. Performed at [facility], [address] [place], [place] [address]   Troponin I - ONCE - STAT     Status: None   Collection Time: 10/29/18 10:37 AM  Result Value Ref Range   Troponin I <0.03 <0.03 ng/mL    Comment: Performed at [facility], [address] [place], [place] [address]  Type and screen [place]     Status: None   Collection Time: 10/29/18 10:37 AM  Result Value Ref Range   ABO/RH(D) AB POS    Antibody Screen NEG     Sample Expiration      11/01/2018,2359 Performed at [facility], [address] [place], [place] [address]    Dg Chest Port 1 View  Result Date: 10/29/2018 CLINICAL DATA:  Vomiting blood EXAM: PORTABLE CHEST 1 VIEW COMPARISON:  08/11/2018 FINDINGS: Low volume AP portable examination with redemonstrated bibasilar predominant fibrotic opacity. There is no acute appearing airspace opacity. Mild cardiomegaly. IMPRESSION: Low volume AP portable examination with redemonstrated bibasilar predominant fibrotic opacity. There is no acute appearing airspace opacity. Mild cardiomegaly. Electronically Signed   By: Eddie Candle M.D.   On: 10/29/2018 11:41    Pending Labs Unresulted Labs (From admission, onward)    Start     Ordered   10/29/18 1039  SARS Coronavirus 2 (CEPHEID - Performed in [place] hospital lab), Hosp Order  (Asymptomatic Patients Labs)  Once,   STAT    Question:  Rule Out  Answer:  Yes   10/29/18 1039          Vitals/Pain Today's Vitals   10/29/18 1230 10/29/18 1257 10/29/18 1300 10/29/18 1308  BP: (!) 187/92  (!) 162/93   Pulse:  (!) 107 (!) 103   Resp:  (!) 25 20   SpO2:  100% 100%   Weight:      Height:      PainSc:    5     Isolation Precautions No active isolations  Medications Medications  pantoprazole (PROTONIX) 80 mg in sodium chloride  0.9 % 250 mL (0.32 mg/mL) infusion (8 mg/hr Intravenous Other (enter comment in med admin window) 10/29/18 1228)  pantoprazole (PROTONIX) injection 40 mg (has no administration in time range)  pantoprazole (PROTONIX) 80 mg in sodium chloride 0.9 % 100 mL IVPB (0 mg Intravenous Stopped 10/29/18 1222)  morphine 4 MG/ML injection (  Given During Downtime 10/29/18 1249)    Mobility walks with person assist High fall risk   Focused Assessments see assessments   R Recommendations: See Admitting Provider Note  Report given to:   Additional Notes: Pt blind in left eye, but can see call bell. Uses bedpan with  assistance.

## 2018-10-29 NOTE — Progress Notes (Signed)
   [place] [facility] Day: 0 days [name] is a 78 y.o. female with hx of COPD, chronic pain, hypertension, hyperlipidemia, previous history of pulmonary embolism, anxiety/depression, osteoporosis, history of previous CVA who is presenting with Hematemesis .   Discussed CODE STATUS with the patient at bedside.  Explained to her the difference between DNR and a full code.  Patient does not want any heroic measures to save her life.  She would prefer to be a DNR.  Advance care planning discussed with patient  without additional Family at bedside. All questions in regards to overall condition and expected prognosis answered. The decision was made to continue current code status  CODE STATUS: dnr Time spent: 16 minutes

## 2018-10-29 NOTE — ED Notes (Signed)
EDP at bedside at this time to update patient that she will be admitted.

## 2018-10-29 NOTE — Telephone Encounter (Signed)
I spoke with [name] (DPR signed) and the pt. Pt said she has been vomiting all night,prod cough with white phlegm.fever 100.6 chills,SOB,H/A, upper abd pain with pain level of 8 now. Pt said she will go by ambulance to [facility] ED due to pain level. No S/T, muscle pain, diarrhea and no loss of taste/smell. No travel and no known exposure to covid or flu. FYI to [name] FNP.

## 2018-10-29 NOTE — Progress Notes (Signed)
Spoke with patient's daughter, [name], to update on how patient had been since arrival to floor and also update on plan.  Understanding was verbalized, all questions were answered, and family expressed appreciation for the call.

## 2018-10-29 NOTE — H&P (Signed)
[facility] [place] NAME: [name]    MR#:  [id number]  DATE OF BIRTH:  1940-11-05  DATE OF ADMISSION:  10/29/2018  PRIMARY CARE PHYSICIAN: [name], FNP   REQUESTING/REFERRING PHYSICIAN: Dr. Lenise Arena  CHIEF COMPLAINT:   Chief Complaint  Patient presents with  . Hematemesis    HISTORY OF PRESENT ILLNESS:  [name]  is a 78 y.o. female with a known history of COPD, chronic pain, hypertension, hyperlipidemia, previous history of pulmonary embolism, anxiety/depression, osteoporosis, history of previous CVA who presents to the hospital due to hematemesis.  Patient says she was in her usual state of health and around 3:00 this morning she became nauseated and had 3-4 episodes of bloody vomitus.  Patient also associates some epigastric abdominal pain with the bloody vomitus along with some dizziness.  Patient has never previously had hematemesis before and therefore was a bit concerned and came to the ER for further evaluation.  Patient denies any fevers, chills, shortness of breath, cough congestion or any recent sick contacts.  She denies any recent travel history.  Patient's COVID-19 test is still currently pending.  Due to her persistent hematemesis hospitalist services were contacted for admission.  PAST MEDICAL HISTORY:   Past Medical History:  Diagnosis Date  . Acute postoperative pain 02/03/2018  . Anemia   . B12 deficiency   . Bacteremia 10/07/2014  . Bladder incontinence   . Blind left eye   . Cancer ([place])    skin cancer   . Cataract   . Cervical spine fracture ([place])   . Chest pain 07/29/2013  . Compression fracture    C7,  upper T spine -compression fx  . COPD (chronic obstructive pulmonary disease) ([place])   . DDD (degenerative disc disease), lumbar   . Depression    major  . Diffuse myofascial pain syndrome 03/24/2015  . Dyspnea    at times- when activty  . Fever 10/05/2014  . GERD (gastroesophageal  reflux disease)   . Hiatal hernia   . Hyperlipidemia   . Hypertension   . 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 Macular degeneration   . On home oxygen therapy    "3L; all the time" (04/30/2018)  . Osteoarthritis   . Osteoporosis   . Pneumonia    years ago  . Pulmonary embolism ([place]) 04/30/2018  . Reactive airway disease   . Rupture of bowel ([place])   . Sepsis ([place]) 10/08/2014  . Stroke [facility])    "mini stroke"- balance and memory issue  . Stroke ([place])   . Wrist pain, acute 09/10/2012    PAST SURGICAL HISTORY:   Past Surgical History:  Procedure Laterality Date  . ABDOMINAL HYSTERECTOMY    . ABDOMINAL SURGERY    . APPENDECTOMY    . BLADDER SURGERY    . CATARACT EXTRACTION W/ INTRAOCULAR LENS  IMPLANT, BILATERAL Bilateral   . CHOLECYSTECTOMY    . COLON SURGERY    . COLOSTOMY REVERSAL    . INTRAMEDULLARY (IM) NAIL INTERTROCHANTERIC Right 09/13/2016   Procedure: INTRAMEDULLARY (IM) NAIL INTERTROCHANTRIC RIGHT;  Surgeon: Leandrew Koyanagi, MD;  Location: [facility];  Service: Orthopedics;  Laterality: Right;  . JOINT REPLACEMENT Bilateral   . KNEE SURGERY    . OSTOMY    . RECTOCELE REPAIR    . TOE SURGERY Right    3rd  . TOTAL HIP ARTHROPLASTY Right 09/30/2017   Procedure: RIGHT HIP IM NAIL REMOVAL WITH CONVERSION  TO TOTAL HIP ARTHOPLASTY, anterior approach;  Surgeon: Renette Butters, MD;  Location: [facility];  Service: Orthopedics;  Laterality: Right;    SOCIAL HISTORY:   Social History   Tobacco Use  . Smoking status: Former Smoker    Packs/day: 1.50    Years: 30.00    Pack years: 45.00    Types: E-cigarettes, Cigarettes    Last attempt to quit: 2013    Years since quitting: 7.4  . Smokeless tobacco: Never Used  Substance Use Topics  . Alcohol use: No    Alcohol/week: 0.0 standard drinks    FAMILY HISTORY:   Family History  Problem Relation Age of Onset  . Heart failure Mother   . Heart attack Father   . Lung cancer Sister   . Deep vein thrombosis Sister    . Pulmonary embolism Son   . Deep vein thrombosis Son   . Cervical cancer Daughter     DRUG ALLERGIES:   Allergies  Allergen Reactions  . Ampicillin Anaphylaxis, Nausea Only and Swelling    SEVERE HEADACHE MUSCLE CRAMPS ANGIOEDEMA THRUSH PATIENT HAS HAD A PCN REACTION WITH IMMEDIATE RASH, FACIAL/TONGUE/THROAT SWELLING, SOB, OR LIGHTHEADEDNESS WITH HYPOTENSION:  #  #  YES  #  #  Has patient had a PCN reaction causing severe rash involving mucus membranes or skin necrosis: No Has patient had a PCN reaction that required hospitalization:No Has patient had a PCN reaction occurring within the last 10 years: No.  . Ampicillin-Sulbactam Sodium Anaphylaxis and Other (See Comments)    SEVERE HEADACHE MUSCLE CRAMPS ANGIOEDEMA THRUSH PATIENT HAS HAD A PCN REACTION WITH IMMEDIATE RASH, FACIAL/TONGUE/THROAT SWELLING, SOB, OR LIGHTHEADEDNESS WITH HYPOTENSION: # # YES # # Has patient had a PCN reaction causing severe rash involving mucus membranes or skin necrosis: No Has patient had a PCN reaction that required hospitalization:No Has patient had a PCN reaction occurring within the last 10 years: No   . Ambien [Zolpidem Tartrate] Nausea And Vomiting and Other (See Comments)    HALLUCINATIONS SWEATING  . Flexeril [Cyclobenzaprine] Other (See Comments)    EXTRAPYRAMIDAL MOVEMENT INVOLUNTARY MUSCLE JERKING  . Tape Itching, Rash and Other (See Comments)    Paper tape only. Adhesive tape=itching/burning/rash  . Toradol [Ketorolac Tromethamine] Nausea Only and Other (See Comments)    HEADACHE  BACKACHE  . Sulfamethoxazole-Trimethoprim     UNSPECIFIED REACTION   . Zolpidem     Hallucinations  . Cephalexin Rash and Other (See Comments)    HEADACHES  . Tramadol Rash and Other (See Comments)    HEADACHE     REVIEW OF SYSTEMS:   Review of Systems  Constitutional: Negative for fever and weight loss.  HENT: Negative for congestion, nosebleeds and tinnitus.   Eyes: Negative for blurred  vision, double vision and redness.  Respiratory: Negative for cough, hemoptysis and shortness of breath.   Cardiovascular: Negative for chest pain, orthopnea, leg swelling and PND.  Gastrointestinal: Positive for nausea and vomiting. Negative for abdominal pain, diarrhea and melena.  Genitourinary: Negative for dysuria, hematuria and urgency.  Musculoskeletal: Negative for falls and joint pain.  Neurological: Negative for dizziness, tingling, sensory change, focal weakness, seizures, weakness and headaches.  Endo/Heme/Allergies: Negative for polydipsia. Does not bruise/bleed easily.  Psychiatric/Behavioral: Negative for depression and memory loss. The patient is not nervous/anxious.     MEDICATIONS AT HOME:   Prior to Admission medications   Medication Sig Start Date End Date Taking? Authorizing Provider  acetaminophen (TYLENOL) 500 MG tablet Take 2 tablets (  1,000 mg total) by mouth 3 (three) times daily. 08/25/18  Yes Ria Bush, MD  albuterol (ACCUNEB) 1.25 MG/3ML nebulizer solution Take 3 mLs (1.25 mg total) by nebulization every 6 (six) hours as needed for wheezing. 08/26/18  Yes [name], FNP  albuterol (VENTOLIN HFA) 108 (90 Base) MCG/ACT inhaler Inhale 2 puffs into the lungs every 6 (six) hours as needed for wheezing or shortness of breath. 09/25/18  Yes [name], FNP  alendronate (FOSAMAX) 70 MG tablet Take 70 mg by mouth every Monday. Take with a full glass of water on an empty stomach.   Yes [provider]  ALPRAZolam (XANAX) 1 MG tablet TAKE 1 TABLET (1 MG TOTAL) BY MOUTH AT BEDTIME AS NEEDED FOR SLEEP. 09/25/18  Yes [name], FNP  amLODipine (NORVASC) 5 MG tablet TAKE 1 TABLET BY MOUTH EVERY DAY 09/21/18  Yes [name], FNP  apixaban (ELIQUIS) 2.5 MG TABS tablet Take 1 tablet (2.5 mg total) by mouth 2 (two) times daily. Patient taking differently: Take 5 mg by mouth 2 (two) times daily.  10/06/18  Yes Earlie Server, MD  Cholecalciferol  (VITAMIN D-3 PO) Take 2,000 Units by mouth daily.   Yes [provider]  citalopram (CELEXA) 20 MG tablet Take 1.5 tablets (30 mg total) by mouth daily. Patient taking differently: Take 20 mg by mouth daily.  09/22/18  Yes [name], FNP  docusate sodium (COLACE) 100 MG capsule Take 200 mg by mouth 2 (two) times daily as needed for moderate constipation.    Yes [provider]  esomeprazole (NEXIUM) 40 MG capsule Take 40 mg by mouth daily. Sometimes twice daily   Yes [provider]  fluticasone (FLOVENT HFA) 220 MCG/ACT inhaler Inhale 1 puff into the lungs 2 (two) times daily. 09/25/18  Yes [name], FNP  Krill Oil 500 MG CAPS Take 500 mg by mouth daily.    Yes [provider]  LINZESS 290 MCG CAPS capsule TAKE 1 CAPSULE (290 MCG TOTAL) BY MOUTH EVERY MORNING BEFORE BREAKFAST KEEP IN ORIGINAL BOTTLE. 08/11/18  Yes [name], FNP  lovastatin (MEVACOR) 10 MG tablet Take 1 tablet (10 mg total) by mouth daily. 09/10/18  Yes [name], FNP  NARCAN 4 MG/0.1ML LIQD nasal spray kit  09/10/18  Yes [provider]  ondansetron (ZOFRAN ODT) 4 MG disintegrating tablet Take 1 tablet (4 mg total) by mouth every 8 (eight) hours as needed for nausea or vomiting. 06/12/18  Yes [name], FNP  Oxycodone HCl 10 MG TABS Take 10 mg by mouth 3 (three) times daily. 09/23/18  Yes [provider]  polyethylene glycol (MIRALAX / GLYCOLAX) packet Take 17 g by mouth daily as needed (for constipation.).    Yes [provider]  pregabalin (LYRICA) 100 MG capsule Take 1 capsule (100 mg total) by mouth 2 (two) times daily. 06/25/18 12/22/18 Yes Copland, Frederico Hamman, MD  vitamin B-12 (CYANOCOBALAMIN) 500 MCG tablet Take 500 mcg by mouth daily.   Yes [provider]      VITAL SIGNS:  Blood pressure (!) 162/93, pulse (!) 103, resp. rate 20, height _0  (1.651 m), weight 83.9 kg, SpO2 100 %.  PHYSICAL EXAMINATION:  Physical  Exam  GENERAL:  78 y.o.-year-old patient lying in the bed in no acute distress.  EYES: Pupils equal, round, reactive to light and accommodation. No scleral icterus. Extraocular muscles intact.  HEENT: Head atraumatic, normocephalic. Oropharynx and nasopharynx clear. No oropharyngeal  erythema, moist oral mucosa  NECK:  Supple, no jugular venous distention. No thyroid enlargement, no tenderness.  LUNGS: Normal breath sounds bilaterally, no wheezing, rales, rhonchi. No use of accessory muscles of respiration.  CARDIOVASCULAR: S1, S2 RRR. No murmurs, rubs, gallops, clicks.  ABDOMEN: Soft, nontender, nondistended. Bowel sounds present. No organomegaly or mass.  EXTREMITIES: No pedal edema, cyanosis, or clubbing. + 2 pedal & radial pulses b/l.   NEUROLOGIC: Cranial nerves II through XII are intact. No focal Motor or sensory deficits appreciated b/l PSYCHIATRIC: The patient is alert and oriented x 3.  SKIN: No obvious rash, lesion, or ulcer.   LABORATORY PANEL:   CBC Recent Labs  Lab 10/29/18 1037  WBC 11.8*  HGB 12.4  HCT 40.8  PLT 224   ------------------------------------------------------------------------------------------------------------------  Chemistries  Recent Labs  Lab 10/29/18 1037  NA 144  K 3.4*  CL 111  CO2 21*  GLUCOSE 106*  BUN 8  CREATININE 0.98  CALCIUM 9.6  AST 25  ALT 15  ALKPHOS 86  BILITOT 0.6   ------------------------------------------------------------------------------------------------------------------  Cardiac Enzymes Recent Labs  Lab 10/29/18 1037  TROPONINI <0.03   ------------------------------------------------------------------------------------------------------------------  RADIOLOGY:  Dg Chest Port 1 View  Result Date: 10/29/2018 CLINICAL DATA:  Vomiting blood EXAM: PORTABLE CHEST 1 VIEW COMPARISON:  08/11/2018 FINDINGS: Low volume AP portable examination with redemonstrated bibasilar predominant fibrotic opacity. There is no  acute appearing airspace opacity. Mild cardiomegaly. IMPRESSION: Low volume AP portable examination with redemonstrated bibasilar predominant fibrotic opacity. There is no acute appearing airspace opacity. Mild cardiomegaly. Electronically Signed   By: Eddie Candle M.D.   On: 10/29/2018 11:41     IMPRESSION AND PLAN:   78 y.o. female with a known history of COPD, chronic pain, hypertension, hyperlipidemia, previous history of pulmonary embolism, anxiety/depression, osteoporosis, history of previous CVA who presents to the hospital due to hematemesis.   1.  GI bleed-patient presents with multiple episodes of hematemesis. -Hemoglobin currently stable.  Hold patient's Eliquis.  Patient is hemodynamically stable. -We will place patient on Protonix drip, get a gastroenterology consult.  Follow serial hemoglobin. -Keep patient n.p.o. for now  2.  Essential hypertension-patient is presently hemodynamically stable.  Continue Norvasc.  3.  Hyperlipidemia-continue lovastatin.    4.  Chronic pain-continue patient's oxycodone, Lyrica  5.  Anxiety/depression-continue Linzess, Celexa, Xanax as needed.  6.  History of pulmonary embolism-patient is on Eliquis which I will currently hold given the acute GI bleed.    All the records are reviewed and case discussed with ED provider. Management plans discussed with the patient, family and they are in agreement.  CODE STATUS: DNR  TOTAL TIME TAKING CARE OF THIS PATIENT: 40 minutes.    Henreitta Leber M.D on 10/29/2018 at 1:27 PM  Between 7am to 6pm - Pager - [phone]  After 6pm go to [email or web address] - password [email or web address] [facility]  Office  [phone]  CC: Primary care physician; [name], FNP

## 2018-10-29 NOTE — ED Provider Notes (Signed)
[facility] Emergency Department Provider Note       Time seen: ----------------------------------------- 10:37 AM on 10/29/2018 -----------------------------------------   I have reviewed the triage vital signs and the nursing notes.  HISTORY   Chief Complaint No chief complaint on file.   HPI [name] is a 78 y.o. female with a history of anemia, COPD, depression, hypertension, hyperlipidemia, GERD, sepsis, CVA, pulmonary embolism on anticoagulants who presents to the ED for hematemesis.  Patient reportedly had at least 4 episodes of hematemesis.  She has never had this happen before, is complaining of upper abdominal pain.  She denies any other complaints.  Past Medical History:  Diagnosis Date  . Acute postoperative pain 02/03/2018  . Anemia   . B12 deficiency   . Bacteremia 10/07/2014  . Bladder incontinence   . Blind left eye   . Cancer ([place])    skin cancer   . Cataract   . Cervical spine fracture ([place])   . Chest pain 07/29/2013  . Compression fracture    C7,  upper T spine -compression fx  . COPD (chronic obstructive pulmonary disease) ([place])   . DDD (degenerative disc disease), lumbar   . Depression    major  . Diffuse myofascial pain syndrome 03/24/2015  . Dyspnea    at times- when activty  . Fever 10/05/2014  . GERD (gastroesophageal reflux disease)   . Hiatal hernia   . Hyperlipidemia   . Hypertension   . 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 Macular degeneration   . On home oxygen therapy    "3L; all the time" (04/30/2018)  . Osteoarthritis   . Osteoporosis   . Pneumonia    years ago  . Pulmonary embolism ([place]) 04/30/2018  . Reactive airway disease   . Rupture of bowel ([place])   . Sepsis ([place]) 10/08/2014  . Stroke [facility])    "mini stroke"- balance and memory issue  . Stroke ([place])   . Wrist pain, acute 09/10/2012    Patient Active Problem List   Diagnosis Date Noted  . ILD (interstitial lung disease) ([place])  08/12/2018  . COPD exacerbation ([place]) 05/17/2018  . Pressure injury of skin 05/04/2018  . PE (pulmonary thromboembolism) ([place]) 04/30/2018  . Acute metabolic encephalopathy   . Respiratory failure with hypoxia ([place]) 04/20/2018  . CAP (community acquired pneumonia) 04/20/2018  . Acute lower UTI 04/20/2018  . Chronic lower extremity pain (Right) 04/07/2018  . Abnormal MRI, lumbar spine 04/07/2018  . Lumbar facet hypertrophy (Multilevel) (Bilateral) 04/07/2018  . Lumbar foraminal stenosis (L3-4, L4-5) (Bilateral) 04/07/2018  . Annular tear of lumbar disc (L3-4, L4-5) 04/07/2018  . Closed hip fracture, sequela (Right) 02/03/2018  . It band syndrome, right 12/18/2017  . History of hip replacement (Right) 12/18/2017  . Ileus ([place]) 10/27/2017  . Primary osteoarthritis of hip 09/30/2017  . History of small bowel obstruction 09/10/2017  . 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 SBO (small bowel obstruction) ([place]) 08/24/2017  . Abnormal x-ray of pelvis 08/13/2017  . Other specified dorsopathies, sacral and sacrococcygeal region 08/04/2017  . Spondylosis without myelopathy or radiculopathy, lumbosacral region 07/17/2017  . Non-traumatic compression fracture of T3 thoracic vertebra, sequela 07/02/2017  . High risk medication use 07/02/2017  . At high risk for falls 07/02/2017  . Chronic sacroiliac joint pain (Right) 07/02/2017  . CKD (chronic kidney disease) stage 3, GFR 30-59 ml/min (HCC) 04/23/2017  . Chronic knee pain s/p total knee replacement (TKR) (Right) 04/02/2017  .  DDD (degenerative disc disease), lumbar 03/05/2017  . Chronic knee pain (Bilateral) (R>L) 03/05/2017  . Osteoarthritis 03/05/2017  . Chronic musculoskeletal pain 03/05/2017  . Neurogenic pain 03/05/2017  . Chronic myofascial pain 02/12/2017  . Lumbar facet syndrome (Bilateral) (L>R) 02/12/2017  . Long term prescription benzodiazepine use 02/12/2017  . Opiate use 02/12/2017  . Disorder of skeletal  system 02/12/2017  . Pharmacologic therapy 02/12/2017  . Problems influencing health status 02/12/2017  . 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 NSAID long-term use 02/12/2017  . DDD (degenerative disc disease), cervical 11/11/2016  . Lumbar facet arthropathy (Bilateral) 11/11/2016  . 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 CAFL (chronic airflow limitation) ([place]) 11/07/2016  . Depression, major, in partial remission ([place]) 11/07/2016  . 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 Bed wetting 11/07/2016  . Long term current use of opiate analgesic 11/07/2016  . Long term prescription opiate use 11/07/2016  . Chronic pain syndrome 11/07/2016  . Chronic neck pain (Secondary Area of Pain) (Bilateral) (L>R) 11/07/2016  . Chronic hip pain (Right) 11/07/2016  . Rib pain 11/07/2016  . Age-related osteoporosis with current pathological fracture with routine healing 09/20/2016  . History of stroke 09/20/2016  . Major depression in remission ([place]) 09/20/2016  . Intertrochanteric fracture of femur, sequela (Right) 09/13/2016  . Fall   . Chronic shoulder pain (Bilateral) 08/12/2016  . Hx of total knee replacement (Bilateral) 04/04/2016  . Chronic shoulder pain (Left) 02/28/2016  . Hoarseness 01/12/2016  . Pharyngoesophageal dysphagia 01/12/2016  . Chronic low back pain [facility]) (Bilateral) (L>R) 10/26/2015  . 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 Syncope, non cardiac 05/22/2015  . Thrombocytopenia ([place]) 10/07/2014  . Hypoxia 10/05/2014  . Anxiety 06/15/2014  . Duodenogastric reflux 06/15/2014  . Benign essential HTN 06/15/2014  . Hypercholesteremia 07/30/2013  . HTN (hypertension) 07/30/2013  . GERD (gastroesophageal reflux disease) 07/30/2013  . Dizziness and giddiness 06/18/2013  . Memory loss 05/03/2013  . Chronic knee pain (Primary Area of Pain)  (Right) 09/10/2012    Past Surgical History:  Procedure Laterality Date  . ABDOMINAL HYSTERECTOMY    . ABDOMINAL SURGERY    . APPENDECTOMY    . BLADDER SURGERY    . CATARACT EXTRACTION W/ INTRAOCULAR LENS  IMPLANT, BILATERAL Bilateral   . CHOLECYSTECTOMY    . COLON SURGERY    . COLOSTOMY REVERSAL    . INTRAMEDULLARY (IM) NAIL INTERTROCHANTERIC Right 09/13/2016   Procedure: INTRAMEDULLARY (IM) NAIL INTERTROCHANTRIC RIGHT;  Surgeon: Leandrew Koyanagi, MD;  Location: [facility];  Service: Orthopedics;  Laterality: Right;  . JOINT REPLACEMENT Bilateral   . KNEE SURGERY    . OSTOMY    . RECTOCELE REPAIR    . TOE SURGERY Right    3rd  . TOTAL HIP ARTHROPLASTY Right 09/30/2017   Procedure: RIGHT HIP IM NAIL REMOVAL WITH CONVERSION TO TOTAL HIP ARTHOPLASTY, anterior approach;  Surgeon: Renette Butters, MD;  Location: ;  Service: Orthopedics;  Laterality: Right;    Allergies Ampicillin; Ampicillin-sulbactam sodium; Ambien [zolpidem tartrate]; Flexeril [cyclobenzaprine]; Tape; Toradol [ketorolac tromethamine]; Sulfamethoxazole-trimethoprim; Zolpidem; Cephalexin; and Tramadol  Social History Social History   Tobacco Use  . Smoking status: Former Smoker    Packs/day: 1.50    Years: 30.00    Pack years: 45.00    Types: E-cigarettes, Cigarettes    Last attempt to quit: 2013    Years since quitting: 7.4  .  Smokeless tobacco: Never Used  Substance Use Topics  . Alcohol use: No    Alcohol/week: 0.0 standard drinks  . Drug use: No   Review of Systems Constitutional: Negative for fever. Cardiovascular: Negative for chest pain. Respiratory: Negative for shortness of breath. Gastrointestinal: Positive for abdominal pain, positive for hematemesis Musculoskeletal: Negative for back pain. Skin: Negative for rash. Neurological: Negative for headaches, focal weakness or numbness.  All systems negative/normal/unremarkable except as stated in the  HPI  ____________________________________________   PHYSICAL EXAM:  VITAL SIGNS: ED Triage Vitals  Enc Vitals Group     BP      Pulse      Resp      Temp      Temp src      SpO2      Weight      Height      Head Circumference      Peak Flow      Pain Score      Pain Loc      Pain Edu?      Excl. in [place]?    Constitutional: Alert and oriented. Well appearing and in no distress. ENT      Head: Normocephalic and atraumatic.      Nose: No congestion/rhinnorhea.      Mouth/Throat: Mucous membranes are moist.      Neck: No stridor. Cardiovascular: Normal rate, regular rhythm. No murmurs, rubs, or gallops. Respiratory: Normal respiratory effort without tachypnea nor retractions. Breath sounds are clear and equal bilaterally. No wheezes/rales/rhonchi. Gastrointestinal: Epigastric tenderness, no rebound or guarding.  Normal bowel sounds. Musculoskeletal: Nontender with normal range of motion in extremities. No lower extremity tenderness nor edema. Neurologic:  Normal speech and language. No gross focal neurologic deficits are appreciated.  Skin:  Skin is warm, dry and intact. No rash noted. Psychiatric: Mood and affect are normal. Speech and behavior are normal.  ____________________________________________  EKG: Interpreted by me.  Sinus tachycardia with a rate of 110 bpm, LVH, inferior infarct age-indeterminate, normal axis.  ____________________________________________  ED COURSE:  As part of my medical decision making, I reviewed the following data within the [place] History obtained from family if available, nursing notes, old chart and ekg, as well as notes from prior ED visits. Patient presented for hematemesis, we will assess with labs and imaging as indicated at this time.   Procedures  [name] was evaluated in Emergency Department on 10/29/2018 for the symptoms described in the history of present illness. She was evaluated in the context of  the global COVID-19 pandemic, which necessitated consideration that the patient might be at risk for infection with the SARS-CoV-2 virus that causes COVID-19. Institutional protocols and algorithms that pertain to the evaluation of patients at risk for COVID-19 are in a state of rapid change based on information released by regulatory bodies including the CDC and federal and state organizations. These policies and algorithms were followed during the patient's care in the ED.  ____________________________________________   LABS (pertinent positives/negatives)  Labs Reviewed  CBC WITH DIFFERENTIAL/PLATELET - Abnormal; Notable for the following components:      Result Value   WBC 11.8 (*)    RDW 15.9 (*)    Neutro Abs 9.3 (*)    All other components within normal limits  COMPREHENSIVE METABOLIC PANEL - Abnormal; Notable for the following components:   Potassium 3.4 (*)    CO2 21 (*)    Glucose, Bld 106 (*)  GFR calc non Af Amer 56 (*)    All other components within normal limits  SARS CORONAVIRUS 2 (HOSPITAL ORDER, [facility])  LIPASE, BLOOD  PROTIME-INR  TROPONIN I  TYPE AND SCREEN  ____________________________________________   CXR: IMPRESSION: Low volume AP portable examination with redemonstrated bibasilar predominant fibrotic opacity. There is no acute appearing airspace opacity. Mild cardiomegaly.  DIFFERENTIAL DIAGNOSIS   Gastritis, peptic ulcer disease, coagulopathy, esophageal varices  FINAL ASSESSMENT AND PLAN  Hematemesis   Plan: The patient had presented for hematemesis. Patient's labs are overall reassuring at this point. Patient's imaging did not reveal any acute process.  I have started her on IV Protonix with a Protonix infusion.  We will hold her anticoagulants.  I will discuss with the hospitalist for admission.   Laurence Aly, MD    Note: This note was generated in part or whole with voice recognition software. Voice  recognition is usually quite accurate but there are transcription errors that can and very often do occur. I apologize for any typographical errors that were not detected and corrected.     Earleen Newport, MD 10/29/18 [id number]

## 2018-10-29 NOTE — ED Triage Notes (Signed)
See downtime forms for triage.

## 2018-10-29 NOTE — Consult Note (Addendum)
Nicole Darby, MD [address]  [place]  [place], [place] [address]  Main: [phone]  Fax: [phone] Pager: [phone]   Consultation  Referring Provider:     No ref. provider found Primary Care Physician:  [name], FNP Primary Gastroenterologist: [facility]      Reason for Consultation:    Hematemesis  Date of Admission:  10/29/2018 Date of Consultation:  10/29/2018         HPI:   [name] is a 78 y.o. 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  Patient reports upper abdominal pain, shortness of breath and she was wheezing when I interviewed her.  Her hemoglobin in the ER was 12.4 which is her baseline.  She did not have any more episodes since she came to ER.  Patient is hemodynamically stable.  She has been started on pantoprazole drip, tolerating clear liquids well.  Eliquis has been held.  She did not take Eliquis today COVID-19 test returned negative   NSAIDs: None  Antiplts/Anticoagulants/Anti thrombotics: Eliquis for PE  GI Procedures: None  Past Medical History:  Diagnosis Date  . Acute postoperative pain 02/03/2018  . Anemia   . B12 deficiency   . Bacteremia 10/07/2014  . Bladder incontinence   . Blind left eye   . Cancer ([place])    skin cancer   . Cataract   . Cervical spine fracture ([place])   . Chest pain 07/29/2013  . Compression fracture    C7,  upper T spine -compression fx  . COPD (chronic obstructive pulmonary disease) ([place])   . DDD (degenerative disc disease), lumbar   . Depression    major  . Diffuse myofascial pain syndrome 03/24/2015  . Dyspnea    at times- when activty  . Fever 10/05/2014  . GERD (gastroesophageal reflux disease)   . Hiatal hernia   . Hyperlipidemia   . Hypertension   . Hypertensive kidney  disease, malignant 11/07/2016  . Macular degeneration   . On home oxygen therapy    "3L; all the time" (04/30/2018)  . Osteoarthritis   . Osteoporosis   . Pneumonia    years ago  . Pulmonary embolism ([place]) 04/30/2018  . Reactive airway disease   . Rupture of bowel ([place])   . Sepsis ([place]) 10/08/2014  . Stroke [facility])    "mini stroke"- balance and memory issue  . Stroke ([place])   . Wrist pain, acute 09/10/2012    Past Surgical History:  Procedure Laterality Date  . ABDOMINAL HYSTERECTOMY    . ABDOMINAL SURGERY    . APPENDECTOMY    . BLADDER SURGERY    . CATARACT EXTRACTION W/ INTRAOCULAR LENS  IMPLANT, BILATERAL Bilateral   . CHOLECYSTECTOMY    . COLON SURGERY    . COLOSTOMY REVERSAL    . INTRAMEDULLARY (IM) NAIL INTERTROCHANTERIC Right 09/13/2016   Procedure: INTRAMEDULLARY (IM) NAIL INTERTROCHANTRIC RIGHT;  Surgeon: Leandrew Koyanagi, MD;  Location: [facility];  Service: Orthopedics;  Laterality: Right;  . JOINT REPLACEMENT Bilateral   . KNEE SURGERY    . OSTOMY    . RECTOCELE REPAIR    . TOE SURGERY Right    3rd  . TOTAL HIP ARTHROPLASTY Right 09/30/2017   Procedure: RIGHT HIP IM NAIL REMOVAL WITH CONVERSION TO TOTAL HIP ARTHOPLASTY,  anterior approach;  Surgeon: Renette Butters, MD;  Location: [facility];  Service: Orthopedics;  Laterality: Right;    Prior to Admission medications   Medication Sig Start Date End Date Taking? Authorizing Provider  acetaminophen (TYLENOL) 500 MG tablet Take 2 tablets (1,000 mg total) by mouth 3 (three) times daily. 08/25/18  Yes Ria Bush, MD  albuterol (ACCUNEB) 1.25 MG/3ML nebulizer solution Take 3 mLs (1.25 mg total) by nebulization every 6 (six) hours as needed for wheezing. 08/26/18  Yes [name], FNP  albuterol (VENTOLIN HFA) 108 (90 Base) MCG/ACT inhaler Inhale 2 puffs into the lungs every 6 (six) hours as needed for wheezing or shortness of breath. 09/25/18  Yes [name], FNP  alendronate (FOSAMAX) 70 MG tablet Take 70 mg by mouth  every Monday. Take with a full glass of water on an empty stomach.   Yes [provider]  ALPRAZolam (XANAX) 1 MG tablet TAKE 1 TABLET (1 MG TOTAL) BY MOUTH AT BEDTIME AS NEEDED FOR SLEEP. Patient taking differently: Take by mouth at bedtime.  09/25/18  Yes [name], FNP  amLODipine (NORVASC) 5 MG tablet TAKE 1 TABLET BY MOUTH EVERY DAY 09/21/18  Yes [name], FNP  apixaban (ELIQUIS) 2.5 MG TABS tablet Take 1 tablet (2.5 mg total) by mouth 2 (two) times daily. Patient taking differently: Take 5 mg by mouth 2 (two) times daily.  10/06/18  Yes Earlie Server, MD  Calcium Carbonate (CALCIUM 600 PO) Take 1 tablet by mouth daily.   Yes [provider]  Cholecalciferol (VITAMIN D-3 PO) Take 2,000 Units by mouth daily.   Yes [provider]  citalopram (CELEXA) 20 MG tablet Take 1.5 tablets (30 mg total) by mouth daily. Patient taking differently: Take 20 mg by mouth daily.  09/22/18  Yes [name], FNP  docusate sodium (COLACE) 100 MG capsule Take 200 mg by mouth 2 (two) times daily as needed for moderate constipation.    Yes [provider]  esomeprazole (NEXIUM) 40 MG capsule Take 40 mg by mouth daily. Sometimes twice daily   Yes [provider]  fluticasone (FLOVENT HFA) 220 MCG/ACT inhaler Inhale 1 puff into the lungs 2 (two) times daily. 09/25/18  Yes [name], FNP  Krill Oil 500 MG CAPS Take 500 mg by mouth daily.    Yes [provider]  LINZESS 290 MCG CAPS capsule TAKE 1 CAPSULE (290 MCG TOTAL) BY MOUTH EVERY MORNING BEFORE BREAKFAST KEEP IN ORIGINAL BOTTLE. 08/11/18  Yes [name], FNP  lovastatin (MEVACOR) 10 MG tablet Take 1 tablet (10 mg total) by mouth daily. 09/10/18  Yes [name], FNP  NARCAN 4 MG/0.1ML LIQD nasal spray kit  09/10/18  Yes [provider]  ondansetron (ZOFRAN ODT) 4 MG disintegrating tablet Take 1 tablet (4 mg total) by mouth every 8 (eight) hours as needed for nausea or  vomiting. 06/12/18  Yes [name], FNP  Oxycodone HCl 10 MG TABS Take 10 mg by mouth 3 (three) times daily. 09/23/18  Yes [provider]  polyethylene glycol (MIRALAX / GLYCOLAX) packet Take 17 g by mouth daily as needed (for constipation.).    Yes [provider]  pregabalin (LYRICA) 100 MG capsule Take 1 capsule (100 mg total) by mouth 2 (two) times daily. 06/25/18 12/22/18 Yes Copland, Frederico Hamman, MD  vitamin B-12 (CYANOCOBALAMIN) 500 MCG tablet Take 500 mcg by mouth daily.   Yes [provider]   Current Facility-Administered  Medications:  .  0.9 %  sodium chloride infusion, , Intravenous, Continuous, Sainani, Belia Heman, MD, Last Rate: 100 mL/hr at 10/29/18 1635 .  acetaminophen (TYLENOL) tablet 650 mg, 650 mg, Oral, Q6H PRN **OR** acetaminophen (TYLENOL) suppository 650 mg, 650 mg, Rectal, Q6H PRN, Sainani, Vivek J, MD .  albuterol (PROVENTIL) (2.5 MG/3ML) 0.083% nebulizer solution 3 mL, 3 mL, Nebulization, Q6H PRN, Sainani, Vivek J, MD .  ALPRAZolam Duanne Moron) tablet 1 mg, 1 mg, Oral, QHS PRN, Verdell Carmine, Vivek J, MD .  amLODipine (NORVASC) tablet 5 mg, 5 mg, Oral, Daily, Henreitta Leber, MD, 5 mg at 10/29/18 1633 .  citalopram (CELEXA) tablet 30 mg, 30 mg, Oral, Daily, Henreitta Leber, MD, 30 mg at 10/29/18 1633 .  [START ON 10/30/2018] linaclotide (LINZESS) capsule 290 mcg, 290 mcg, Oral, QAC breakfast, Sainani, Vivek J, MD .  ondansetron (ZOFRAN) tablet 4 mg, 4 mg, Oral, Q6H PRN **OR** ondansetron (ZOFRAN) injection 4 mg, 4 mg, Intravenous, Q6H PRN, Sainani, Vivek J, MD .  oxyCODONE (Oxy IR/ROXICODONE) immediate release tablet 10 mg, 10 mg, Oral, TID, Henreitta Leber, MD, 10 mg at 10/29/18 1633 .  pantoprazole (PROTONIX) 80 mg in sodium chloride 0.9 % 250 mL (0.32 mg/mL) infusion, 8 mg/hr, Intravenous, Continuous, Sainani, Belia Heman, MD, Last Rate: 25 mL/hr at 10/29/18 1228, 8 mg/hr at 10/29/18 1228 .  [START ON 11/01/2018] pantoprazole (PROTONIX) injection 40 mg, 40 mg,  Intravenous, Q12H, Sainani, Vivek J, MD .  polyethylene glycol (MIRALAX / GLYCOLAX) packet 17 g, 17 g, Oral, Daily PRN, Verdell Carmine, Vivek J, MD .  pravastatin (PRAVACHOL) tablet 10 mg, 10 mg, Oral, q1800, Henreitta Leber, MD, 10 mg at 10/29/18 1633 .  pregabalin (LYRICA) capsule 100 mg, 100 mg, Oral, BID, Sainani, Belia Heman, MD .  vitamin B-12 (CYANOCOBALAMIN) tablet 500 mcg, 500 mcg, Oral, Daily, Sainani, Belia Heman, MD   Family History  Problem Relation Age of Onset  . Heart failure Mother   . Heart attack Father   . Lung cancer Sister   . Deep vein thrombosis Sister   . Pulmonary embolism Son   . Deep vein thrombosis Son   . Cervical cancer Daughter      Social History   Tobacco Use  . Smoking status: Former Smoker    Packs/day: 1.50    Years: 30.00    Pack years: 45.00    Types: E-cigarettes, Cigarettes    Last attempt to quit: 2013    Years since quitting: 7.4  . Smokeless tobacco: Never Used  Substance Use Topics  . Alcohol use: No    Alcohol/week: 0.0 standard drinks  . Drug use: No    Allergies as of 10/29/2018 - Review Complete 10/29/2018  Allergen Reaction Noted  . Ampicillin Anaphylaxis, Nausea Only, and Swelling 09/22/2014  . Ampicillin-sulbactam sodium Anaphylaxis and Other (See Comments) 09/08/2011  . Ambien [zolpidem tartrate] Nausea And Vomiting and Other (See Comments) 05/03/2013  . Flexeril [cyclobenzaprine] Other (See Comments) 04/03/2017  . Tape Itching, Rash, and Other (See Comments) 10/08/2014  . Toradol [ketorolac tromethamine] Nausea Only and Other (See Comments) 09/22/2014  . Sulfamethoxazole-trimethoprim  10/25/2014  . Zolpidem  10/26/2015  . Cephalexin Rash and Other (See Comments) 10/25/2014  . Tramadol Rash and Other (See Comments) 09/22/2014    Review of Systems:    All systems reviewed and negative except where noted in HPI.   Physical Exam:  Vital signs in last 24 hours: Temp:  [99.8 F (37.7 C)-100 F (37.8 C)] 99.8  F (37.7 C) (06/04  1630) Pulse Rate:  [84-107] 84 (06/04 1733) Resp:  [18-27] 20 (06/04 1524) BP: (147-187)/(71-98) 147/71 (06/04 1524) SpO2:  [85 %-100 %] 96 % (06/04 1733) FiO2 (%):  [28 %] 28 % (06/04 1619) Weight:  [83.9 kg] 83.9 kg (06/04 1524) Last BM Date: 10/28/18 General:   Pleasant, cooperative in NAD Head:  Normocephalic and atraumatic. Eyes:   No icterus.   Conjunctiva pink. PERRLA. Ears:  Normal auditory acuity. Neck:  Supple; no masses or thyroidomegaly Lungs: Respirations even and unlabored. Audible wheezes, no crackles, or rhonchi.  Heart:  Regular rate and rhythm;  Without murmur, clicks, rubs or gallops Abdomen:  Soft, nondistended, nontender. Normal bowel sounds. No appreciable masses or hepatomegaly.  No rebound or guarding.  Rectal:  Not performed. Msk:  Symmetrical without gross deformities. Extremities:  Without edema, cyanosis or clubbing. Neurologic:  Alert and oriented x3;  grossly normal neurologically. Skin:  Intact without significant lesions or rashes. Psych:  Alert and cooperative. Normal affect.  LAB RESULTS: CBC Latest Ref Rng & Units 10/29/2018 10/01/2018 07/03/2018  WBC 4.0 - 10.5 K/uL 11.8(H) 5.4 6.1  Hemoglobin 12.0 - 15.0 g/dL 12.4 12.2 13.1  Hematocrit 36.0 - 46.0 % 40.8 39.5 42.8  Platelets 150 - 400 K/uL 224 210 203    BMET BMP Latest Ref Rng & Units 10/29/2018 05/17/2018 05/13/2018  Glucose 70 - 99 mg/dL 106(H) 65(L) 73  BUN 8 - 23 mg/dL 8 7(L) 13  Creatinine 0.44 - 1.00 mg/dL 0.98 0.94 1.15  BUN/Creat Ratio 12 - 28 - - -  Sodium 135 - 145 mmol/L 144 139 140  Potassium 3.5 - 5.1 mmol/L 3.4(L) 3.6 4.2  Chloride 98 - 111 mmol/L 111 106 104  CO2 22 - 32 mmol/L 21(L) 24 31  Calcium 8.9 - 10.3 mg/dL 9.6 8.9 9.8    LFT Hepatic Function Latest Ref Rng & Units 10/29/2018 05/17/2018 05/13/2018  Total Protein 6.5 - 8.1 g/dL 7.5 7.2 7.3  Albumin 3.5 - 5.0 g/dL 3.5 3.0(L) 3.1(L)  AST 15 - 41 U/L _0 ALT 0 - 44 U/L _1 Alk Phosphatase 38 - 126 U/L 86 51  46  Total Bilirubin 0.3 - 1.2 mg/dL 0.6 0.6 0.2  Bilirubin, Direct 0.1 - 0.5 mg/dL - - -     STUDIES: Dg Chest Port 1 View  Result Date: 10/29/2018 CLINICAL DATA:  Vomiting blood EXAM: PORTABLE CHEST 1 VIEW COMPARISON:  08/11/2018 FINDINGS: Low volume AP portable examination with redemonstrated bibasilar predominant fibrotic opacity. There is no acute appearing airspace opacity. Mild cardiomegaly. IMPRESSION: Low volume AP portable examination with redemonstrated bibasilar predominant fibrotic opacity. There is no acute appearing airspace opacity. Mild cardiomegaly. Electronically Signed   By: Eddie Candle M.D.   On: 10/29/2018 11:41      Impression / Plan:   [name] is a 78 y.o. female with history of COPD, hypertension hyperlipidemia, unprovoked PE/DVT admitted with clinically insignificant hematemesis.  Her hemoglobin is normal.  Given that she is on anticoagulation, prior history of dysphagia, recommend upper endoscopy for further evaluation  N.p.o. past midnight Continue to hold Eliquis Plan for EGD tomorrow Continue IV Protonix  Unprovoked PE/DVT Unclear etiology, patient is followed by Dr. Tasia Catchings Patient denied having colonoscopy in the past past and she insisted on receiving during this hospital stay I explained to her that this can be pursued as outpatient by her primary gastroenterologist  Thank you for involving me in the  care of this patient.  Will follow along with you    LOS: 0 days   Sherri Sear, MD  10/29/2018, 7:11 PM   Note: This dictation was prepared with Dragon dictation along with smaller phrase technology. Any transcriptional errors that result from this process are unintentional.

## 2018-10-30 ENCOUNTER — Observation Stay: Payer: Medicare Other | Admitting: Certified Registered"

## 2018-10-30 ENCOUNTER — Encounter: Payer: Self-pay | Admitting: *Deleted

## 2018-10-30 ENCOUNTER — Encounter
Admission: EM | Disposition: A | Discharge status: Home / Self Care | Payer: Self-pay | Source: Home / Self Care | Attending: Emergency Medicine

## 2018-10-30 DIAGNOSIS — K920 Hematemesis: Secondary | ICD-10-CM | POA: Diagnosis not present

## 2018-10-30 HISTORY — PX: ESOPHAGOGASTRODUODENOSCOPY: SHX5428

## 2018-10-30 LAB — BASIC METABOLIC PANEL
Anion gap: 7 (ref 5–15)
BUN: 10 mg/dL (ref 8–23)
CO2: 25 mmol/L (ref 22–32)
Calcium: 8.3 mg/dL — ABNORMAL LOW (ref 8.9–10.3)
Chloride: 110 mmol/L (ref 98–111)
Creatinine, Ser: 1.08 mg/dL — ABNORMAL HIGH (ref 0.44–1.00)
GFR calc Af Amer: 57 mL/min — ABNORMAL LOW (ref >60)
GFR calc non Af Amer: 49 mL/min — ABNORMAL LOW (ref >60)
Glucose, Bld: 86 mg/dL (ref 70–99)
Potassium: 3.3 mmol/L — ABNORMAL LOW (ref 3.5–5.1)
Sodium: 142 mmol/L (ref 135–145)

## 2018-10-30 LAB — CBC
HCT: 31.5 % — ABNORMAL LOW (ref 36.0–46.0)
Hemoglobin: 9.5 g/dL — ABNORMAL LOW (ref 12.0–15.0)
MCH: 27.2 pg (ref 26.0–34.0)
MCHC: 30.2 g/dL (ref 30.0–36.0)
MCV: 90.3 fL (ref 80.0–100.0)
Platelets: 174 10*3/uL (ref 150–400)
RBC: 3.49 MIL/uL — ABNORMAL LOW (ref 3.87–5.11)
RDW: 16.1 % — ABNORMAL HIGH (ref 11.5–15.5)
WBC: 7.3 10*3/uL (ref 4.0–10.5)
nRBC: 0 % (ref 0.0–0.2)

## 2018-10-30 SURGERY — EGD (ESOPHAGOGASTRODUODENOSCOPY)
Anesthesia: General

## 2018-10-30 MED ORDER — PROPOFOL 10 MG/ML IV BOLUS
INTRAVENOUS | Status: DC | PRN
  Administered 2018-10-30 (×2): 20 mg via INTRAVENOUS
  Administered 2018-10-30: 50 mg via INTRAVENOUS
  Administered 2018-10-30: 20 mg via INTRAVENOUS

## 2018-10-30 MED ORDER — LIDOCAINE HCL (CARDIAC) PF 100 MG/5ML IV SOSY
PREFILLED_SYRINGE | INTRAVENOUS | Status: DC | PRN
  Administered 2018-10-30: 100 mg via INTRATRACHEAL

## 2018-10-30 NOTE — Anesthesia Preprocedure Evaluation (Addendum)
Anesthesia Evaluation  Patient identified by MRN, date of birth, ID band Patient awake    Reviewed: Allergy & Precautions, H&P , NPO status , Patient's Chart, lab work & pertinent test results  History of Anesthesia Complications Negative for: history of anesthetic complications  Airway Mallampati: II  TM Distance: >3 FB Neck ROM: limited   Comment: States she has a "broken neck" that has not been fixed.  Pain with lateral rotation Dental  (+) Edentulous Lower, Edentulous Upper   Pulmonary shortness of breath (claims she no longer needs home O2, no longer uses it) and Long-Term Oxygen Therapy, pneumonia (5 mos ago), COPD,  oxygen dependent, former smoker,  Interstitial lung disease          Cardiovascular hypertension,      Neuro/Psych PSYCHIATRIC DISORDERS Anxiety Depression Chronic pain, on chronic opioid therapy Spinal stenosis Myofascial pain syndrome H/o metabolic encephalopathy CVA    GI/Hepatic Neg liver ROS, hiatal hernia, GERD  ,Hematemesis, last episode yesterday   Endo/Other  negative endocrine ROS  Renal/GU CRFRenal disease  negative genitourinary   Musculoskeletal   Abdominal   Peds  Hematology  (+) Blood dyscrasia, anemia , H/o DVT, PE on Eliquis   Anesthesia Other Findings Past Medical History: 02/03/2018: Acute postoperative pain No date: Anemia No date: B12 deficiency 10/07/2014: Bacteremia No date: Bladder incontinence No date: Blind left eye No date: Cancer [facility])     Comment:  skin cancer  No date: Cataract No date: Cervical spine fracture ([place]) 07/29/2013: Chest pain No date: Compression fracture     Comment:  C7,  upper T spine -compression fx No date: COPD (chronic obstructive pulmonary disease) ([facility]) No date: DDD (degenerative disc disease), lumbar No date: Depression     Comment:  major 03/24/2015: Diffuse myofascial pain syndrome No date: Dyspnea     Comment:  at times- when  activty 10/05/2014: Fever No date: GERD (gastroesophageal reflux disease) No date: Hiatal hernia No date: Hyperlipidemia No date: Hypertension 11/07/2016: Hypertensive kidney disease, malignant No date: Macular degeneration No date: On home oxygen therapy     Comment:  "3L; all the time" (04/30/2018) No date: Osteoarthritis No date: Osteoporosis No date: Pneumonia     Comment:  years ago 04/30/2018: Pulmonary embolism ([facility]) No date: Reactive airway disease No date: Rupture of bowel ([place]) 10/08/2014: Sepsis ([place]) No date: Stroke [facility])     Comment:  "mini stroke"- balance and memory issue No date: Stroke ([place]) 09/10/2012: Wrist pain, acute  Past Surgical History: No date: ABDOMINAL HYSTERECTOMY No date: ABDOMINAL SURGERY No date: APPENDECTOMY No date: BLADDER SURGERY No date: CATARACT EXTRACTION W/ INTRAOCULAR LENS  IMPLANT, BILATERAL;  Bilateral No date: CHOLECYSTECTOMY No date: COLON SURGERY No date: COLOSTOMY REVERSAL 09/13/2016: INTRAMEDULLARY (IM) NAIL INTERTROCHANTERIC; Right     Comment:  Procedure: INTRAMEDULLARY (IM) NAIL INTERTROCHANTRIC               RIGHT;  Surgeon: Leandrew Koyanagi, MD;  Location: [facility];                Service: Orthopedics;  Laterality: Right; No date: JOINT REPLACEMENT; Bilateral No date: KNEE SURGERY No date: OSTOMY No date: RECTOCELE REPAIR No date: TOE SURGERY; Right     Comment:  3rd 09/30/2017: TOTAL HIP ARTHROPLASTY; Right     Comment:  Procedure: RIGHT HIP IM NAIL REMOVAL WITH CONVERSION TO               TOTAL HIP ARTHOPLASTY, anterior approach;  Surgeon:  Renette Butters, MD;  Location: [facility];  Service:               Orthopedics;  Laterality: Right;  BMI    Body Mass Index:  30.78 kg/m      Reproductive/Obstetrics negative OB ROS                            Anesthesia Physical Anesthesia Plan  ASA: IV  Anesthesia Plan: General   Post-op Pain Management:    Induction:   PONV Risk Score and  Plan: Propofol infusion and TIVA  Airway Management Planned: Natural Airway and Simple Face Mask  Additional Equipment:   Intra-op Plan:   Post-operative Plan:   Informed Consent: I have reviewed the patients History and Physical, chart, labs and discussed the procedure including the risks, benefits and alternatives for the proposed anesthesia with the patient or authorized representative who has indicated his/her understanding and acceptance.     Dental Advisory Given  Plan Discussed with: Anesthesiologist and CRNA  Anesthesia Plan Comments:        Anesthesia Quick Evaluation

## 2018-10-30 NOTE — Care Management Important Message (Signed)
Important Message  Patient Details  Name: [name] MRN: [id number] Date of Birth: [birth year]   Medicare Important Message Given:  Yes    Shela Leff, LCSW 10/30/2018, 2:09 PM

## 2018-10-30 NOTE — Discharge Summary (Signed)
[facility] NAME: [name]    MR#:  [id number]  DATE OF BIRTH:  05/04/1941  DATE OF ADMISSION:  10/29/2018 ADMITTING PHYSICIAN: Henreitta Leber, MD  DATE OF DISCHARGE: 10/30/2018  PRIMARY CARE PHYSICIAN: [name], FNP    ADMISSION DIAGNOSIS:  Vomiting blood [K92.0] Hematemesis with nausea [K92.0]  DISCHARGE DIAGNOSIS:  Active Problems:   GI bleed   SECONDARY DIAGNOSIS:   Past Medical History:  Diagnosis Date  . Acute postoperative pain 02/03/2018  . Anemia   . B12 deficiency   . Bacteremia 10/07/2014  . Bladder incontinence   . Blind left eye   . Cancer ([place])    skin cancer   . Cataract   . Cervical spine fracture ([place])   . Chest pain 07/29/2013  . Compression fracture    C7,  upper T spine -compression fx  . COPD (chronic obstructive pulmonary disease) ([place])   . DDD (degenerative disc disease), lumbar   . Depression    major  . Diffuse myofascial pain syndrome 03/24/2015  . Dyspnea    at times- when activty  . Fever 10/05/2014  . GERD (gastroesophageal reflux disease)   . Hiatal hernia   . Hyperlipidemia   . Hypertension   . 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 Macular degeneration   . On home oxygen therapy    "3L; all the time" (04/30/2018)  . Osteoarthritis   . Osteoporosis   . Pneumonia    years ago  . Pulmonary embolism ([place]) 04/30/2018  . Reactive airway disease   . Rupture of bowel ([place])   . Sepsis ([place]) 10/08/2014  . Stroke [facility])    "mini stroke"- balance and memory issue  . Stroke ([place])   . Wrist pain, acute 09/10/2012    HOSPITAL COURSE:   78 y.o. female with a known history of COPD, chronic pain, hypertension, hyperlipidemia, previous history of pulmonary embolism, anxiety/depression, osteoporosis, history of previous CVA who presents to the hospital due to hematemesis.   1.  GI bleed-patient presents with multiple episodes of hematemesis. -Patient's Eliquis was  held.  Patient's hemoglobin remained stable.  Initially started on Protonix drip.  Seen by gastroenterology underwent an upper GI endoscopy which showed no evidence of acute bleeding. -Patient will be discharged on her home Nexium with outpatient GI follow-up.  She tolerated p.o. well post endoscopy and can now resume her Eliquis.  2.  Essential hypertension- remained hemodynamically stable.  pt. Will Continue Norvasc.  3.  Hyperlipidemia- pt. Will continue lovastatin.    4.  Chronic pain-pt. Will continue patient's oxycodone, Lyrica  5.  Anxiety/depression- pt. Will continue Linzess, Celexa, Xanax as needed.  6.  History of pulmonary embolism- Eliquis was held initially but will resume post-endoscopy now.   Stable for discharge.   DISCHARGE CONDITIONS:   Stable.   CONSULTS OBTAINED:  Treatment Team:  Lin Landsman, MD  DRUG ALLERGIES:   Allergies  Allergen Reactions  . Ampicillin Anaphylaxis, Nausea Only and Swelling    SEVERE HEADACHE MUSCLE CRAMPS ANGIOEDEMA THRUSH PATIENT HAS HAD A PCN REACTION WITH IMMEDIATE RASH, FACIAL/TONGUE/THROAT SWELLING, SOB, OR LIGHTHEADEDNESS WITH HYPOTENSION:  #  #  YES  #  #  Has patient had a PCN reaction causing severe rash involving mucus membranes or skin necrosis: No Has patient had a PCN reaction that required hospitalization:No Has patient had a PCN reaction occurring within the last 10 years: No.  . Ampicillin-Sulbactam Sodium Anaphylaxis  and Other (See Comments)    SEVERE HEADACHE MUSCLE CRAMPS ANGIOEDEMA THRUSH PATIENT HAS HAD A PCN REACTION WITH IMMEDIATE RASH, FACIAL/TONGUE/THROAT SWELLING, SOB, OR LIGHTHEADEDNESS WITH HYPOTENSION: # # YES # # Has patient had a PCN reaction causing severe rash involving mucus membranes or skin necrosis: No Has patient had a PCN reaction that required hospitalization:No Has patient had a PCN reaction occurring within the last 10 years: No   . Ambien [Zolpidem Tartrate] Nausea And  Vomiting and Other (See Comments)    HALLUCINATIONS SWEATING  . Flexeril [Cyclobenzaprine] Other (See Comments)    EXTRAPYRAMIDAL MOVEMENT INVOLUNTARY MUSCLE JERKING  . Tape Itching, Rash and Other (See Comments)    Paper tape only. Adhesive tape=itching/burning/rash  . Toradol [Ketorolac Tromethamine] Nausea Only and Other (See Comments)    HEADACHE  BACKACHE  . Sulfamethoxazole-Trimethoprim     UNSPECIFIED REACTION   . Zolpidem     Hallucinations  . Cephalexin Rash and Other (See Comments)    HEADACHES  . Tramadol Rash and Other (See Comments)    HEADACHE     DISCHARGE MEDICATIONS:   Allergies as of 10/30/2018      Reactions   Ampicillin Anaphylaxis, Nausea Only, Swelling   SEVERE HEADACHE MUSCLE CRAMPS ANGIOEDEMA THRUSH PATIENT HAS HAD A PCN REACTION WITH IMMEDIATE RASH, FACIAL/TONGUE/THROAT SWELLING, SOB, OR LIGHTHEADEDNESS WITH HYPOTENSION:  #  #  YES  #  #  Has patient had a PCN reaction causing severe rash involving mucus membranes or skin necrosis: No Has patient had a PCN reaction that required hospitalization:No Has patient had a PCN reaction occurring within the last 10 years: No.   Ampicillin-sulbactam Sodium Anaphylaxis, Other (See Comments)   SEVERE HEADACHE MUSCLE CRAMPS ANGIOEDEMA THRUSH PATIENT HAS HAD A PCN REACTION WITH IMMEDIATE RASH, FACIAL/TONGUE/THROAT SWELLING, SOB, OR LIGHTHEADEDNESS WITH HYPOTENSION: # # YES # # Has patient had a PCN reaction causing severe rash involving mucus membranes or skin necrosis: No Has patient had a PCN reaction that required hospitalization:No Has patient had a PCN reaction occurring within the last 10 years: No   Ambien [zolpidem Tartrate] Nausea And Vomiting, Other (See Comments)   HALLUCINATIONS SWEATING   Flexeril [cyclobenzaprine] Other (See Comments)   EXTRAPYRAMIDAL MOVEMENT INVOLUNTARY MUSCLE JERKING   Tape Itching, Rash, Other (See Comments)   Paper tape only. Adhesive tape=itching/burning/rash   Toradol  [ketorolac Tromethamine] Nausea Only, Other (See Comments)   HEADACHE  BACKACHE   Sulfamethoxazole-trimethoprim    UNSPECIFIED REACTION    Zolpidem    Hallucinations   Cephalexin Rash, Other (See Comments)   HEADACHES   Tramadol Rash, Other (See Comments)   HEADACHE       Medication List    TAKE these medications   acetaminophen 500 MG tablet Commonly known as:  TYLENOL Take 2 tablets (1,000 mg total) by mouth 3 (three) times daily.   albuterol 1.25 MG/3ML nebulizer solution Commonly known as:  ACCUNEB Take 3 mLs (1.25 mg total) by nebulization every 6 (six) hours as needed for wheezing.   albuterol 108 (90 Base) MCG/ACT inhaler Commonly known as:  VENTOLIN HFA Inhale 2 puffs into the lungs every 6 (six) hours as needed for wheezing or shortness of breath.   alendronate 70 MG tablet Commonly known as:  FOSAMAX Take 70 mg by mouth every Monday. Take with a full glass of water on an empty stomach.   ALPRAZolam 1 MG tablet Commonly known as:  XANAX TAKE 1 TABLET (1 MG TOTAL) BY MOUTH AT BEDTIME AS NEEDED  FOR SLEEP. What changed:    how much to take  when to take this   amLODipine 5 MG tablet Commonly known as:  NORVASC TAKE 1 TABLET BY MOUTH EVERY DAY   apixaban 2.5 MG Tabs tablet Commonly known as:  Eliquis Take 1 tablet (2.5 mg total) by mouth 2 (two) times daily. What changed:  how much to take   CALCIUM 600 PO Take 1 tablet by mouth daily.   citalopram 20 MG tablet Commonly known as:  CELEXA Take 1.5 tablets (30 mg total) by mouth daily. What changed:  how much to take   docusate sodium 100 MG capsule Commonly known as:  COLACE Take 200 mg by mouth 2 (two) times daily as needed for moderate constipation.   esomeprazole 40 MG capsule Commonly known as:  NEXIUM Take 40 mg by mouth daily. Sometimes twice daily   fluticasone 220 MCG/ACT inhaler Commonly known as:  FLOVENT HFA Inhale 1 puff into the lungs 2 (two) times daily.   Krill Oil 500 MG  Caps Take 500 mg by mouth daily.   Linzess 290 MCG Caps capsule Generic drug:  linaclotide TAKE 1 CAPSULE (290 MCG TOTAL) BY MOUTH EVERY MORNING BEFORE BREAKFAST KEEP IN ORIGINAL BOTTLE.   lovastatin 10 MG tablet Commonly known as:  MEVACOR Take 1 tablet (10 mg total) by mouth daily.   Narcan 4 MG/0.1ML Liqd nasal spray kit Generic drug:  naloxone   ondansetron 4 MG disintegrating tablet Commonly known as:  Zofran ODT Take 1 tablet (4 mg total) by mouth every 8 (eight) hours as needed for nausea or vomiting.   Oxycodone HCl 10 MG Tabs Take 10 mg by mouth 3 (three) times daily.   polyethylene glycol 17 g packet Commonly known as:  MIRALAX / GLYCOLAX Take 17 g by mouth daily as needed (for constipation.).   pregabalin 100 MG capsule Commonly known as:  Lyrica Take 1 capsule (100 mg total) by mouth 2 (two) times daily.   vitamin B-12 500 MCG tablet Commonly known as:  CYANOCOBALAMIN Take 500 mcg by mouth daily.   VITAMIN D-3 PO Take 2,000 Units by mouth daily.         DISCHARGE INSTRUCTIONS:   DIET:  Cardiac diet  DISCHARGE CONDITION:  Stable  ACTIVITY:  Activity as tolerated  OXYGEN:  Home Oxygen: No.   Oxygen Delivery: room air  DISCHARGE LOCATION:  home   If you experience worsening of your admission symptoms, develop shortness of breath, life threatening emergency, suicidal or homicidal thoughts you must seek medical attention immediately by calling 911 or calling your MD immediately  if symptoms less severe.  You Must read complete instructions/literature along with all the possible adverse reactions/side effects for all the Medicines you take and that have been prescribed to you. Take any new Medicines after you have completely understood and accpet all the possible adverse reactions/side effects.   Please note  You were cared for by a hospitalist during your hospital stay. If you have any questions about your discharge medications or the care you  received while you were in the hospital after you are discharged, you can call the unit and asked to speak with the hospitalist on call if the hospitalist that took care of you is not available. Once you are discharged, your primary care physician will handle any further medical issues. Please note that NO REFILLS for any discharge medications will be authorized once you are discharged, as it is imperative that you return to  your primary care physician (or establish a relationship with a primary care physician if you do not have one) for your aftercare needs so that they can reassess your need for medications and monitor your lab values.     Today   No acute bleeding overnight.  Hg. Stable.  S/p Endoscopy today showing no acute bleeding and pt. Wants to go home.   VITAL SIGNS:  Blood pressure (!) 146/79, pulse 83, temperature 98.3 F (36.8 C), temperature source Oral, resp. rate (!) 22, height '5\' 5"'  (1.651 m), weight 83.9 kg, SpO2 93 %.  I/O:    Intake/Output Summary (Last 24 hours) at 10/30/2018 1602 Last data filed at 10/30/2018 1455 Gross per 24 hour  Intake 2379.35 ml  Output 900 ml  Net 1479.35 ml    PHYSICAL EXAMINATION:  GENERAL:  78 y.o.-year-old patient lying in the bed with no acute distress.  EYES: Pupils equal, round, reactive to light and accommodation. No scleral icterus. Extraocular muscles intact.  HEENT: Head atraumatic, normocephalic. Oropharynx and nasopharynx clear.  NECK:  Supple, no jugular venous distention. No thyroid enlargement, no tenderness.  LUNGS: Normal breath sounds bilaterally, no wheezing, rales,rhonchi. No use of accessory muscles of respiration.  CARDIOVASCULAR: S1, S2 normal. No murmurs, rubs, or gallops.  ABDOMEN: Soft, non-tender, non-distended. Bowel sounds present. No organomegaly or mass.  EXTREMITIES: No pedal edema, cyanosis, or clubbing.  NEUROLOGIC: Cranial nerves II through XII are intact. No focal motor or sensory defecits b/l.   PSYCHIATRIC: The patient is alert and oriented x 3.  SKIN: No obvious rash, lesion, or ulcer.   DATA REVIEW:   CBC Recent Labs  Lab 10/30/18 0339  WBC 7.3  HGB 9.5*  HCT 31.5*  PLT 174    Chemistries  Recent Labs  Lab 10/29/18 1037 10/30/18 0339  NA 144 142  K 3.4* 3.3*  CL 111 110  CO2 21* 25  GLUCOSE 106* 86  BUN 8 10  CREATININE 0.98 1.08*  CALCIUM 9.6 8.3*  AST 25  --   ALT 15  --   ALKPHOS 86  --   BILITOT 0.6  --     Cardiac Enzymes Recent Labs  Lab 10/29/18 1037  TROPONINI <0.03    Microbiology Results  Results for orders placed or performed during the hospital encounter of 10/29/18  SARS Coronavirus 2 (CEPHEID - Performed in [place] hospital lab), Hosp Order     Status: None   Collection Time: 10/29/18 12:03 PM  Result Value Ref Range Status   SARS Coronavirus 2 NEGATIVE NEGATIVE Final    Comment: (NOTE) If result is NEGATIVE SARS-CoV-2 target nucleic acids are NOT DETECTED. The SARS-CoV-2 RNA is generally detectable in upper and lower  respiratory specimens during the acute phase of infection. The lowest  concentration of SARS-CoV-2 viral copies this assay can detect is 250  copies / mL. A negative result does not preclude SARS-CoV-2 infection  and should not be used as the sole basis for treatment or other  patient management decisions.  A negative result may occur with  improper specimen collection / handling, submission of specimen other  than nasopharyngeal swab, presence of viral mutation(s) within the  areas targeted by this assay, and inadequate number of viral copies  (<250 copies / mL). A negative result must be combined with clinical  observations, patient history, and epidemiological information. If result is POSITIVE SARS-CoV-2 target nucleic acids are DETECTED. The SARS-CoV-2 RNA is generally detectable in upper and lower  respiratory specimens  dur ing the acute phase of infection.  Positive  results are indicative of  active infection with SARS-CoV-2.  Clinical  correlation with patient history and other diagnostic information is  necessary to determine patient infection status.  Positive results do  not rule out bacterial infection or co-infection with other viruses. If result is PRESUMPTIVE POSTIVE SARS-CoV-2 nucleic acids MAY BE PRESENT.   A presumptive positive result was obtained on the submitted specimen  and confirmed on repeat testing.  While 2019 novel coronavirus  (SARS-CoV-2) nucleic acids may be present in the submitted sample  additional confirmatory testing may be necessary for epidemiological  and / or clinical management purposes  to differentiate between  SARS-CoV-2 and other Sarbecovirus currently known to infect humans.  If clinically indicated additional testing with an alternate test  methodology [phone]) is advised. The SARS-CoV-2 RNA is generally  detectable in upper and lower respiratory sp ecimens during the acute  phase of infection. The expected result is Negative. Fact Sheet for Patients:  StrictlyIdeas.no Fact Sheet for Healthcare Providers: BankingDealers.co.za This test is not yet approved or cleared by the [place] FDA and has been authorized for detection and/or diagnosis of SARS-CoV-2 by FDA under an Emergency Use Authorization (EUA).  This EUA will remain in effect (meaning this test can be used) for the duration of the COVID-19 declaration under Section 564(b)(1) of the Act, 21 U.S.C. section 360bbb-3(b)(1), unless the authorization is terminated or revoked sooner. Performed at [facility], [address] [place], [place] [address]     RADIOLOGY:  Dg Chest Port 1 View  Result Date: 10/29/2018 CLINICAL DATA:  Vomiting blood EXAM: PORTABLE CHEST 1 VIEW COMPARISON:  08/11/2018 FINDINGS: Low volume AP portable examination with redemonstrated bibasilar predominant fibrotic opacity. There is no acute  appearing airspace opacity. Mild cardiomegaly. IMPRESSION: Low volume AP portable examination with redemonstrated bibasilar predominant fibrotic opacity. There is no acute appearing airspace opacity. Mild cardiomegaly. Electronically Signed   By: Eddie Candle M.D.   On: 10/29/2018 11:41      Management plans discussed with the patient, family and they are in agreement.  CODE STATUS:     Code Status Orders  (From admission, onward)         Start     Ordered   10/29/18 1724  Do not attempt resuscitation (DNR)  Continuous    Question Answer Comment  In the event of cardiac or respiratory ARREST Do not call a "code blue"   In the event of cardiac or respiratory ARREST Do not perform Intubation, CPR, defibrillation or ACLS   In the event of cardiac or respiratory ARREST Use medication by any route, position, wound care, and other measures to relive pain and suffering. May use oxygen, suction and manual treatment of airway obstruction as needed for comfort.   Comments witnessed by Herby Abraham, RN      10/29/18 1724          TOTAL TIME TAKING CARE OF THIS PATIENT: 40 minutes.    Henreitta Leber M.D on 10/30/2018 at 4:02 PM  Between 7am to 6pm - Pager - [phone]  After 6pm go to [email or web address] - Proofreader  [employer] [facility]  Hospitalists  Office  [phone]  CC: Primary care physician; [name], FNP

## 2018-10-30 NOTE — Op Note (Signed)
[facility] Gastroenterology Patient Name: [name] Procedure Date: 10/30/2018 11:14 AM MRN: [id number] Account #: [id number] Date of Birth: Aug 08, 1940 Admit Type: Inpatient Age: 78 Room: [facility] ENDO ROOM 3 Gender: Female Note Status: Finalized Procedure:            Upper GI endoscopy Indications:          Hematemesis Providers:            Lin Landsman MD, MD Referring MD:         [name] (Referring MD) Medicines:            Monitored Anesthesia Care Complications:        No immediate complications. Estimated blood loss: None. Procedure:            Pre-Anesthesia Assessment:                       - Prior to the procedure, a History and Physical was                        performed, and patient medications and allergies were                        reviewed. The patient is competent. The risks and                        benefits of the procedure and the sedation options and                        risks were discussed with the patient. All questions                        were answered and informed consent was obtained.                        Patient identification and proposed procedure were                        verified by the physician, the nurse, the                        anesthesiologist, the anesthetist and the technician in                        the pre-procedure area in the procedure room in the                        endoscopy suite. Mental Status Examination: alert and                        oriented. Airway Examination: normal oropharyngeal                        airway and neck mobility. Respiratory Examination:                        clear to auscultation. CV Examination: normal.                        Prophylactic Antibiotics: The patient does not require  prophylactic antibiotics. Prior Anticoagulants: The                        patient has taken Eliquis (apixaban), last dose was 1                        day  prior to procedure. ASA Grade Assessment: IV - A                        patient with severe systemic disease that is a constant                        threat to life. After reviewing the risks and benefits,                        the patient was deemed in satisfactory condition to                        undergo the procedure. The anesthesia plan was to use                        monitored anesthesia care (MAC). Immediately prior to                        administration of medications, the patient was                        re-assessed for adequacy to receive sedatives. The                        heart rate, respiratory rate, oxygen saturations, blood                        pressure, adequacy of pulmonary ventilation, and                        response to care were monitored throughout the                        procedure. The physical status of the patient was                        re-assessed after the procedure.                       After obtaining informed consent, the endoscope was                        passed under direct vision. Throughout the procedure,                        the patient's blood pressure, pulse, and oxygen                        saturations were monitored continuously. The Endoscope                        was introduced through the mouth, and advanced to the  second part of duodenum. The upper GI endoscopy was                        accomplished without difficulty. The patient tolerated                        the procedure well. Findings:      The duodenal bulb and second portion of the duodenum were normal.      The entire examined stomach was normal.      The cardia and gastric fundus were normal on retroflexion.      The gastroesophageal junction and examined esophagus were normal. Impression:           - Normal duodenal bulb and second portion of the                        duodenum.                       - Normal stomach.                        - Normal gastroesophageal junction and esophagus.                       - No specimens collected. Recommendation:       - Return patient to hospital ward for possible                        discharge same day.                       - Resume previous diet today.                       - Continue present medications.                       - Use Protonix (pantoprazole) 40 mg PO daily.                       - Resume Eliquis (apixaban) at prior dose today. Procedure Code(s):    --- Professional ---                       [phone], Esophagogastroduodenoscopy, flexible, transoral;                        diagnostic, including collection of specimen(s) by                        brushing or washing, when performed (separate procedure) Diagnosis Code(s):    --- Professional ---                       K92.0, Hematemesis CPT copyright 2019 American Medical Association. All rights reserved. The codes documented in this report are preliminary and upon coder review may  be revised to meet current compliance requirements. Dr. Ulyess Mort Lin Landsman MD, MD 10/30/2018 11:33:36 AM This report has been signed electronically. Number of Addenda: 0 Note Initiated On: 10/30/2018 11:14 AM      [facility]

## 2018-10-30 NOTE — Transfer of Care (Signed)
Immediate Anesthesia Transfer of Care Note  Patient: [name]  Procedure(s) Performed: ESOPHAGOGASTRODUODENOSCOPY (EGD) (N/A )  Patient Location: Endoscopy Unit  Anesthesia Type:General  Level of Consciousness: awake, drowsy, patient cooperative and responds to stimulation  Airway & Oxygen Therapy: Patient Spontanous Breathing and Patient connected to face mask oxygen  Post-op Assessment: Report given to RN and Post -op Vital signs reviewed and stable  Post vital signs: Reviewed and stable  Last Vitals:  Vitals Value Taken Time  BP 101/46 10/30/2018 11:36 AM  Temp    Pulse 72 10/30/2018 11:37 AM  Resp 15 10/30/2018 11:37 AM  SpO2 100 % 10/30/2018 11:37 AM  Vitals shown include unvalidated device data.  Last Pain:  Vitals:   10/30/18 1018  TempSrc: Tympanic  PainSc: 5          Complications: No apparent anesthesia complications

## 2018-10-30 NOTE — Anesthesia Post-op Follow-up Note (Signed)
Anesthesia QCDR form completed.        

## 2018-10-30 NOTE — Progress Notes (Signed)
Discharge instructions reviewed with patient including followup visits and new medications.  Understanding was verbalized and all questions were answered.  IV removed without complication; patient tolerated well.  Patient discharged home via wheelchair in stable condition escorted by nursing staff.

## 2018-10-31 NOTE — Anesthesia Postprocedure Evaluation (Signed)
Anesthesia Post Note  Patient: [name]  Procedure(s) Performed: ESOPHAGOGASTRODUODENOSCOPY (EGD) (N/A )  Patient location during evaluation: PACU Anesthesia Type: General Level of consciousness: awake and alert Pain management: pain level controlled Vital Signs Assessment: post-procedure vital signs reviewed and stable Respiratory status: spontaneous breathing, nonlabored ventilation and respiratory function stable Cardiovascular status: blood pressure returned to baseline and stable Postop Assessment: no apparent nausea or vomiting Anesthetic complications: no     Last Vitals:  Vitals:   10/30/18 1158 10/30/18 1239  BP: (!) 132/56 (!) 146/79  Pulse: 74 83  Resp: (!) 22   Temp:  36.8 C  SpO2: 97% 93%    Last Pain:  Vitals:   10/30/18 1400  TempSrc:   PainSc: 10-Worst pain ever                 Durenda Hurt

## 2018-11-02 ENCOUNTER — Other Ambulatory Visit: Payer: Self-pay | Admitting: Family Medicine

## 2018-11-02 NOTE — Telephone Encounter (Signed)
This prescription needs to come from Dr. Tasia Catchings her neurologist.

## 2018-11-02 NOTE — Telephone Encounter (Signed)
Eliquis refill came in from pharmacy. Patient takes 5 mg 1 BID. Per record past refills have not been refilled by our office. Sending to Debbie to review to make sure how to proceed.

## 2018-11-03 ENCOUNTER — Encounter: Payer: Self-pay | Admitting: Family Medicine

## 2018-11-03 NOTE — Telephone Encounter (Signed)
I have decreased patient's eliquis to 2.5mg  BID and she has a Rx already.  No refills for 5mg  tablets. Thank you.   [place]  [facility]

## 2018-11-04 ENCOUNTER — Other Ambulatory Visit (INDEPENDENT_AMBULATORY_CARE_PROVIDER_SITE_OTHER): Payer: Medicare Other

## 2018-11-04 DIAGNOSIS — R300 Dysuria: Secondary | ICD-10-CM

## 2018-11-04 DIAGNOSIS — N3090 Cystitis, unspecified without hematuria: Secondary | ICD-10-CM

## 2018-11-04 LAB — POCT URINALYSIS DIPSTICK
Bilirubin, UA: NEGATIVE
Blood, UA: POSITIVE
Glucose, UA: NEGATIVE
Ketones, UA: NEGATIVE
Nitrite, UA: NEGATIVE
Protein, UA: POSITIVE — AB
Spec Grav, UA: 1.015 (ref 1.010–1.025)
Urobilinogen, UA: 0.2 E.U./dL
pH, UA: 7.5 (ref 5.0–8.0)

## 2018-11-04 NOTE — Telephone Encounter (Signed)
Noted! Thank you

## 2018-11-04 NOTE — Addendum Note (Signed)
Addended by: Ellamae Sia on: 11/04/2018 03:52 PM   Modules accepted: Orders

## 2018-11-05 MED ORDER — CIPROFLOXACIN HCL 250 MG PO TABS: 250.0000 mg | ORAL_TABLET | Freq: Two times a day (BID) | ORAL | 0 refills | Status: AC

## 2018-11-05 NOTE — Addendum Note (Signed)
Addended by: Clarene Reamer B on: 11/05/2018 07:14 AM   Modules accepted: Orders

## 2018-11-06 ENCOUNTER — Encounter: Payer: Self-pay | Admitting: Family Medicine

## 2018-11-06 ENCOUNTER — Ambulatory Visit (INDEPENDENT_AMBULATORY_CARE_PROVIDER_SITE_OTHER): Payer: Medicare Other | Admitting: Family Medicine

## 2018-11-06 ENCOUNTER — Ambulatory Visit: Payer: Medicare Other

## 2018-11-06 DIAGNOSIS — E876 Hypokalemia: Secondary | ICD-10-CM | POA: Diagnosis not present

## 2018-11-06 DIAGNOSIS — Z09 Encounter for follow-up examination after completed treatment for conditions other than malignant neoplasm: Secondary | ICD-10-CM

## 2018-11-06 DIAGNOSIS — R300 Dysuria: Secondary | ICD-10-CM

## 2018-11-06 DIAGNOSIS — N3090 Cystitis, unspecified without hematuria: Secondary | ICD-10-CM | POA: Diagnosis not present

## 2018-11-06 DIAGNOSIS — D649 Anemia, unspecified: Secondary | ICD-10-CM

## 2018-11-06 LAB — URINE CULTURE
MICRO NUMBER:: 555939
SPECIMEN QUALITY:: ADEQUATE

## 2018-11-06 NOTE — Progress Notes (Signed)
Virtual Visit via Video Note  I connected with [name] on 11/06/18 at 11:45 AM EDT by a video enabled telemedicine application and verified that I am speaking with the correct person using two identifiers.  Location: Patient: In her home. Her [name] was present for the call.  Provider: [place]   I discussed the limitations of evaluation and management by telemedicine and the availability of in person appointments. The patient expressed understanding and agreed to proceed.  History of Present Illness: This is a 78 year old female who presents today for a virtual visit for hospital follow-up.  She was admitted to the hospital 10/29/2018 with hematemesis.  She had a negative EGD and she was discharged home.  According to the patient and her daughter she has been doing okay since discharge.  She was recently diagnosed with cystitis and started her ciprofloxacin today.  She has not had fever, chills, chest pain, shortness of breath is at baseline.  She is fairly sedentary, mostly sitting on the sofa.  She is seen by pain management for chronic back pain.  History of bilateral lower extremity DVT and PE and is having a repeat lower extremity Doppler study later today.  She is tolerating apixaban without any overt signs of bleeding.  Current Outpatient Medications:  .  acetaminophen (TYLENOL) 500 MG tablet, Take 2 tablets (1,000 mg total) by mouth 3 (three) times daily., Disp: 30 tablet, Rfl: 0 .  albuterol (ACCUNEB) 1.25 MG/3ML nebulizer solution, Take 3 mLs (1.25 mg total) by nebulization every 6 (six) hours as needed for wheezing., Disp: 75 mL, Rfl: 1 .  albuterol (VENTOLIN HFA) 108 (90 Base) MCG/ACT inhaler, Inhale 2 puffs into the lungs every 6 (six) hours as needed for wheezing or shortness of breath., Disp: 1 Inhaler, Rfl: 2 .  alendronate (FOSAMAX) 70 MG tablet, Take 70 mg by mouth every Monday. Take with a full glass of water on an empty stomach., Disp: , Rfl:  .   ALPRAZolam (XANAX) 1 MG tablet, TAKE 1 TABLET (1 MG TOTAL) BY MOUTH AT BEDTIME AS NEEDED FOR SLEEP. (Patient taking differently: Take by mouth at bedtime. ), Disp: 30 tablet, Rfl: 1 .  amLODipine (NORVASC) 5 MG tablet, TAKE 1 TABLET BY MOUTH EVERY DAY, Disp: 90 tablet, Rfl: 1 .  apixaban (ELIQUIS) 2.5 MG TABS tablet, Take 1 tablet (2.5 mg total) by mouth 2 (two) times daily. (Patient taking differently: Take 5 mg by mouth 2 (two) times daily. ), Disp: 60 tablet, Rfl: 3 .  Calcium Carbonate (CALCIUM 600 PO), Take 1 tablet by mouth daily., Disp: , Rfl:  .  Cholecalciferol (VITAMIN D-3 PO), Take 2,000 Units by mouth daily., Disp: , Rfl:  .  ciprofloxacin (CIPRO) 250 MG tablet, Take 1 tablet (250 mg total) by mouth every 12 (twelve) hours for 5 days., Disp: 10 tablet, Rfl: 0 .  citalopram (CELEXA) 20 MG tablet, Take 1.5 tablets (30 mg total) by mouth daily. (Patient taking differently: Take 20 mg by mouth daily. ), Disp: 135 tablet, Rfl: 1 .  docusate sodium (COLACE) 100 MG capsule, Take 200 mg by mouth 2 (two) times daily as needed for moderate constipation. , Disp: , Rfl:  .  esomeprazole (NEXIUM) 40 MG capsule, Take 40 mg by mouth daily. Sometimes twice daily, Disp: , Rfl:  .  fluticasone (FLOVENT HFA) 220 MCG/ACT inhaler, Inhale 1 puff into the lungs 2 (two) times daily., Disp: 1 Inhaler, Rfl: 11 .  Krill Oil 500 MG CAPS, Take  500 mg by mouth daily. , Disp: , Rfl:  .  LINZESS 290 MCG CAPS capsule, TAKE 1 CAPSULE (290 MCG TOTAL) BY MOUTH EVERY MORNING BEFORE BREAKFAST KEEP IN ORIGINAL BOTTLE., Disp: 90 capsule, Rfl: 1 .  lovastatin (MEVACOR) 10 MG tablet, Take 1 tablet (10 mg total) by mouth daily., Disp: 90 tablet, Rfl: 3 .  NARCAN 4 MG/0.1ML LIQD nasal spray kit, , Disp: , Rfl:  .  ondansetron (ZOFRAN ODT) 4 MG disintegrating tablet, Take 1 tablet (4 mg total) by mouth every 8 (eight) hours as needed for nausea or vomiting., Disp: 30 tablet, Rfl: 0 .  Oxycodone HCl 10 MG TABS, Take 10 mg by mouth 3  (three) times daily., Disp: , Rfl:  .  polyethylene glycol (MIRALAX / GLYCOLAX) packet, Take 17 g by mouth daily as needed (for constipation.). , Disp: , Rfl:  .  pregabalin (LYRICA) 100 MG capsule, Take 1 capsule (100 mg total) by mouth 2 (two) times daily., Disp: 60 capsule, Rfl: 5 .  vitamin B-12 (CYANOCOBALAMIN) 500 MCG tablet, Take 500 mcg by mouth daily., Disp: , Rfl:   Past Medical History:  Diagnosis Date  . Acute postoperative pain 02/03/2018  . Anemia   . B12 deficiency   . Bacteremia 10/07/2014  . Bladder incontinence   . Blind left eye   . Cancer ([place])    skin cancer   . Cataract   . Cervical spine fracture ([place])   . Chest pain 07/29/2013  . Compression fracture    C7,  upper T spine -compression fx  . COPD (chronic obstructive pulmonary disease) ([place])   . DDD (degenerative disc disease), lumbar   . Depression    major  . Diffuse myofascial pain syndrome 03/24/2015  . Dyspnea    at times- when activty  . Fever 10/05/2014  . GERD (gastroesophageal reflux disease)   . Hiatal hernia   . Hyperlipidemia   . Hypertension   . 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 Macular degeneration   . On home oxygen therapy    "3L; all the time" (04/30/2018)  . Osteoarthritis   . Osteoporosis   . Pneumonia    years ago  . Pulmonary embolism ([place]) 04/30/2018  . Reactive airway disease   . Rupture of bowel ([place])   . Sepsis ([place]) 10/08/2014  . Stroke [facility])    "mini stroke"- balance and memory issue  . Stroke ([place])   . Wrist pain, acute 09/10/2012   Past Surgical History:  Procedure Laterality Date  . ABDOMINAL HYSTERECTOMY    . ABDOMINAL SURGERY    . APPENDECTOMY    . BLADDER SURGERY    . CATARACT EXTRACTION W/ INTRAOCULAR LENS  IMPLANT, BILATERAL Bilateral   . CHOLECYSTECTOMY    . COLON SURGERY    . COLOSTOMY REVERSAL    . ESOPHAGOGASTRODUODENOSCOPY N/A 10/30/2018   Procedure: ESOPHAGOGASTRODUODENOSCOPY (EGD);  Surgeon: Lin Landsman, MD;  Location[facility]  ENDOSCOPY;  Service: Gastroenterology;  Laterality: N/A;  . INTRAMEDULLARY (IM) NAIL INTERTROCHANTERIC Right 09/13/2016   Procedure: INTRAMEDULLARY (IM) NAIL INTERTROCHANTRIC RIGHT;  Surgeon: Leandrew Koyanagi, MD;  Location: [place];  Service: Orthopedics;  Laterality: Right;  . JOINT REPLACEMENT Bilateral   . KNEE SURGERY    . OSTOMY    . RECTOCELE REPAIR    . TOE SURGERY Right    3rd  . TOTAL HIP ARTHROPLASTY Right 09/30/2017   Procedure: RIGHT HIP IM NAIL REMOVAL WITH CONVERSION TO TOTAL HIP ARTHOPLASTY, anterior approach;  Surgeon:  Renette Butters, MD;  Location: [place];  Service: Orthopedics;  Laterality: Right;   Family History  Problem Relation Age of Onset  . Heart failure Mother   . Heart attack Father   . Lung cancer Sister   . Deep vein thrombosis Sister   . Pulmonary embolism Son   . Deep vein thrombosis Son   . Cervical cancer Daughter    Social History   Tobacco Use  . Smoking status: Former Smoker    Packs/day: 1.50    Years: 30.00    Pack years: 45.00    Types: E-cigarettes, Cigarettes    Quit date: 2013    Years since quitting: 7.4  . Smokeless tobacco: Never Used  Substance Use Topics  . Alcohol use: No    Alcohol/week: 0.0 standard drinks  . Drug use: No      Observations/Objective: Patient is alert and answers questions appropriately.  Visible skin is unremarkable.  She is normally conversive and able to speak in complete sentences without shortness of breath.  Her mood and affect are normal.  She is very talkative and joking around today. There were no vitals taken for this visit. Wt Readings from Last 3 Encounters:  10/29/18 184 lb 15.5 oz (83.9 kg)  08/11/18 153 lb 12.8 oz (69.8 kg)  08/02/18 169 lb (76.7 kg)   BP Readings from Last 3 Encounters:  10/30/18 (!) 146/79  08/25/18 135/78  08/11/18 122/70    Assessment and Plan: 1. Hypokalemia - last potassium low in hospital, will recheck - Basic Metabolic Panel; Future  2. Anemia, unspecified  type - CBC with Differential/Platelet; Future  3. Hospital discharge follow-up - Reviewed meds, pmh, psh patient's diagnosis, plans for care with patient and her daughter  - follow up labs when they can get by  - follow up in 1-2 months   Clarene Reamer, FNP-BC  [facility] at [facility], [place]  11/06/2018 2:08 PM   Follow Up Instructions:    I discussed the assessment and treatment plan with the patient. The patient was provided an opportunity to ask questions and all were answered. The patient agreed with the plan and demonstrated an understanding of the instructions.   The patient was advised to call back or seek an in-person evaluation if the symptoms worsen or if the condition fails to improve as anticipated.   [name], FNP

## 2018-11-07 LAB — TYPE AND SCREEN
ABO/RH(D): AB POS
Antibody Screen: NEGATIVE

## 2018-11-10 ENCOUNTER — Encounter: Payer: Self-pay | Admitting: Family Medicine

## 2018-11-11 NOTE — Telephone Encounter (Signed)
Spoke to [place], patient's daughter, patient received oxygen when she came home from hospital in November or December 2019 due to pneumonia and PE. Patient has not used oxygen in about 5 months now. Basically not in 2020. I also sent a community message from [place] representative for [name] to review about this. Thank you

## 2018-11-11 NOTE — Telephone Encounter (Signed)
Called patient's daughter and left a message. Also sent community message to [place] (with [facility]) representatives asking about this.

## 2018-11-16 ENCOUNTER — Other Ambulatory Visit: Payer: Self-pay | Admitting: Family Medicine

## 2018-11-16 ENCOUNTER — Other Ambulatory Visit: Payer: Medicare Other

## 2018-11-17 ENCOUNTER — Other Ambulatory Visit: Payer: Medicare Other

## 2018-11-18 ENCOUNTER — Other Ambulatory Visit: Payer: Self-pay | Admitting: Orthopedic Surgery

## 2018-11-18 ENCOUNTER — Ambulatory Visit: Payer: Medicare Other

## 2018-11-18 ENCOUNTER — Other Ambulatory Visit (HOSPITAL_COMMUNITY): Payer: Self-pay | Admitting: Orthopedic Surgery

## 2018-11-18 DIAGNOSIS — T84030A Mechanical loosening of internal right hip prosthetic joint, initial encounter: Secondary | ICD-10-CM

## 2018-11-20 ENCOUNTER — Other Ambulatory Visit (INDEPENDENT_AMBULATORY_CARE_PROVIDER_SITE_OTHER): Payer: Medicare Other

## 2018-11-20 DIAGNOSIS — D649 Anemia, unspecified: Secondary | ICD-10-CM | POA: Diagnosis not present

## 2018-11-20 DIAGNOSIS — E876 Hypokalemia: Secondary | ICD-10-CM | POA: Diagnosis not present

## 2018-11-20 LAB — CBC WITH DIFFERENTIAL/PLATELET
Basophils Absolute: 0 10*3/uL (ref 0.0–0.1)
Basophils Relative: 0.2 % (ref 0.0–3.0)
Eosinophils Absolute: 0.2 10*3/uL (ref 0.0–0.7)
Eosinophils Relative: 5.1 % — ABNORMAL HIGH (ref 0.0–5.0)
HCT: 34.6 % — ABNORMAL LOW (ref 36.0–46.0)
Hemoglobin: 10.7 g/dL — ABNORMAL LOW (ref 12.0–15.0)
Lymphocytes Relative: 35.8 % (ref 12.0–46.0)
Lymphs Abs: 1.7 10*3/uL (ref 0.7–4.0)
MCHC: 31 g/dL (ref 30.0–36.0)
MCV: 87.3 fl (ref 78.0–100.0)
Monocytes Absolute: 0.7 10*3/uL (ref 0.1–1.0)
Monocytes Relative: 14.1 % — ABNORMAL HIGH (ref 3.0–12.0)
Neutro Abs: 2.1 10*3/uL (ref 1.4–7.7)
Neutrophils Relative %: 44.8 % (ref 43.0–77.0)
Platelets: 163 10*3/uL (ref 150.0–400.0)
RBC: 3.96 Mil/uL (ref 3.87–5.11)
RDW: 16.3 % — ABNORMAL HIGH (ref 11.5–15.5)
WBC: 4.8 10*3/uL (ref 4.0–10.5)

## 2018-11-20 LAB — BASIC METABOLIC PANEL
BUN: 11 mg/dL (ref 6–23)
CO2: 29 mEq/L (ref 19–32)
Calcium: 9.2 mg/dL (ref 8.4–10.5)
Chloride: 106 mEq/L (ref 96–112)
Creatinine, Ser: 1.21 mg/dL — ABNORMAL HIGH (ref 0.40–1.20)
GFR: 43.11 mL/min — ABNORMAL LOW (ref >60.00)
Glucose, Bld: 78 mg/dL (ref 70–99)
Potassium: 3.8 mEq/L (ref 3.5–5.1)
Sodium: 142 mEq/L (ref 135–145)

## 2018-11-22 ENCOUNTER — Other Ambulatory Visit: Payer: Self-pay | Admitting: Family Medicine

## 2018-11-23 ENCOUNTER — Encounter (HOSPITAL_COMMUNITY): Payer: Medicare Other

## 2018-11-23 ENCOUNTER — Ambulatory Visit (HOSPITAL_COMMUNITY): Payer: Medicare Other

## 2018-11-23 ENCOUNTER — Encounter: Payer: Self-pay | Admitting: Family Medicine

## 2018-11-23 NOTE — Telephone Encounter (Signed)
Last filled 09/25/2018 with 1 refill, last OV 11/06/2018 hosp f/u.... note to pharmacy attached to be filled on or after 11/25/2018 so that you can send today and they will not refill until 11/25/2018.Marland KitchenMarland KitchenMarland Kitchen please advise

## 2018-11-25 ENCOUNTER — Other Ambulatory Visit: Payer: Self-pay | Admitting: Family Medicine

## 2018-11-25 ENCOUNTER — Encounter: Payer: Self-pay | Admitting: Family Medicine

## 2018-11-30 ENCOUNTER — Other Ambulatory Visit: Payer: Self-pay | Admitting: Family Medicine

## 2018-11-30 ENCOUNTER — Encounter (HOSPITAL_COMMUNITY): Payer: Self-pay

## 2018-11-30 ENCOUNTER — Encounter (HOSPITAL_COMMUNITY): Payer: Medicare Other

## 2018-11-30 ENCOUNTER — Encounter: Payer: Self-pay | Admitting: Family Medicine

## 2018-11-30 DIAGNOSIS — R0982 Postnasal drip: Secondary | ICD-10-CM

## 2018-11-30 DIAGNOSIS — G8929 Other chronic pain: Secondary | ICD-10-CM

## 2018-11-30 DIAGNOSIS — M899 Disorder of bone, unspecified: Secondary | ICD-10-CM

## 2018-11-30 DIAGNOSIS — J42 Unspecified chronic bronchitis: Secondary | ICD-10-CM

## 2018-11-30 NOTE — Progress Notes (Signed)
Orders for bedside toilet, home nebulizer machine printed to be faxed to [place].

## 2018-12-01 ENCOUNTER — Telehealth: Payer: Self-pay | Admitting: Family Medicine

## 2018-12-01 NOTE — Telephone Encounter (Signed)
[facility] called today in regards to the Orders they received for the bed side commode and nebulizer  They stated that they need Notes from last visit for COPD and the length of need added for both items to the order.  Anything that is hand written on the order must be initialed  and dated.  Fax- [phone]  ATTN: Colbert Coyer

## 2018-12-02 NOTE — Telephone Encounter (Signed)
Information faxed as requested.

## 2018-12-04 ENCOUNTER — Other Ambulatory Visit: Payer: Self-pay

## 2018-12-04 ENCOUNTER — Other Ambulatory Visit (INDEPENDENT_AMBULATORY_CARE_PROVIDER_SITE_OTHER): Payer: Medicare Other

## 2018-12-04 ENCOUNTER — Telehealth: Payer: Self-pay

## 2018-12-04 ENCOUNTER — Encounter: Payer: Self-pay | Admitting: Family Medicine

## 2018-12-04 DIAGNOSIS — R300 Dysuria: Secondary | ICD-10-CM

## 2018-12-04 LAB — POCT URINALYSIS DIPSTICK
Bilirubin, UA: NEGATIVE
Blood, UA: POSITIVE
Glucose, UA: NEGATIVE
Ketones, UA: NEGATIVE
Nitrite, UA: POSITIVE
Protein, UA: POSITIVE — AB
Spec Grav, UA: 1.01 (ref 1.010–1.025)
Urobilinogen, UA: 0.2 E.U./dL
pH, UA: 6.5 (ref 5.0–8.0)

## 2018-12-04 NOTE — Addendum Note (Signed)
Addended by: Kris Mouton on: 12/04/2018 04:36 PM   Modules accepted: Orders

## 2018-12-04 NOTE — Telephone Encounter (Signed)
Lew Dawes NP with [facility] (palliative division) did phone note with pt on 12/03/18; the Ph29 scored 11 which is moderate depression and anxiety. Pt is presently taking alprazolam 1 mg at hs and citalopram 20 mg daily. Lelon Frohlich was not sure if [name] FNP was aware or not but pt told Lelon Frohlich that pt vomits small amt every time she eats and this has been going on for years. Last appt was 11/06/18 for HFU. Ann request cb after [name] FNP reviews note.

## 2018-12-04 NOTE — Addendum Note (Signed)
Addended by: Kris Mouton on: 12/04/2018 04:38 PM   Modules accepted: Orders

## 2018-12-04 NOTE — Telephone Encounter (Signed)
Attempted to call Lew Dawes, NP x 2. No answer. Will try again later.

## 2018-12-04 NOTE — Telephone Encounter (Signed)
Called and spoke with Lew Dawes, NP who had a recent phone visit with patient and noted the problems per the note. In reviewing the chart, the patient had a normal EGD last month. Will call her daughter to schedule follow up as none is on file.   Please call patient's daughter and ask about scheduling a follow up appointment to talk about the vomiting and mood. I know she has some upcoming tests and there is no urgency.

## 2018-12-06 LAB — URINE CULTURE
MICRO NUMBER:: 654904
SPECIMEN QUALITY:: ADEQUATE

## 2018-12-06 MED ORDER — CIPROFLOXACIN HCL 250 MG PO TABS: 250.0000 mg | ORAL_TABLET | Freq: Two times a day (BID) | ORAL | 0 refills | Status: DC

## 2018-12-06 NOTE — Addendum Note (Signed)
Addended by: Clarene Reamer B on: 12/06/2018 06:30 PM   Modules accepted: Orders

## 2018-12-07 NOTE — Telephone Encounter (Signed)
Pt daughter is going to call back to make a follow up appointment for pt.

## 2018-12-08 ENCOUNTER — Encounter
Admission: RE | Admit: 2018-12-08 | Discharge: 2018-12-08 | Disposition: A | Discharge status: Home / Self Care | Payer: Medicare Other | Source: Ambulatory Visit | Attending: Orthopedic Surgery | Admitting: Orthopedic Surgery

## 2018-12-08 ENCOUNTER — Encounter: Payer: Self-pay | Admitting: Family Medicine

## 2018-12-08 ENCOUNTER — Other Ambulatory Visit: Payer: Self-pay

## 2018-12-08 DIAGNOSIS — T84030A Mechanical loosening of internal right hip prosthetic joint, initial encounter: Secondary | ICD-10-CM | POA: Insufficient documentation

## 2018-12-08 MED ORDER — TECHNETIUM TC 99M MEDRONATE IV KIT
20.2470 | PACK | Freq: Once | INTRAVENOUS | Status: AC | PRN
  Administered 2018-12-08: 20.247 via INTRAVENOUS

## 2018-12-09 ENCOUNTER — Other Ambulatory Visit: Payer: Self-pay | Admitting: Family Medicine

## 2018-12-09 MED ORDER — TRAZODONE HCL 50 MG PO TABS: 25.0000 mg | ORAL_TABLET | Freq: Every evening | ORAL | 3 refills | Status: DC | PRN

## 2018-12-09 NOTE — Progress Notes (Unsigned)
tra

## 2018-12-10 ENCOUNTER — Ambulatory Visit: Payer: Medicare Other

## 2018-12-11 ENCOUNTER — Encounter: Payer: Self-pay | Admitting: Family Medicine

## 2018-12-17 ENCOUNTER — Ambulatory Visit: Admission: RE | Admit: 2018-12-17 | Payer: Medicare Other | Source: Ambulatory Visit

## 2018-12-18 ENCOUNTER — Encounter: Payer: Self-pay | Admitting: Family Medicine

## 2018-12-18 ENCOUNTER — Ambulatory Visit: Payer: Self-pay | Admitting: Family Medicine

## 2018-12-18 ENCOUNTER — Ambulatory Visit (INDEPENDENT_AMBULATORY_CARE_PROVIDER_SITE_OTHER): Payer: Medicare Other | Admitting: Family Medicine

## 2018-12-18 VITALS — BP 153/70 | HR 68 | Temp 97.4°F | Ht 64.0 in | Wt 180.0 lb

## 2018-12-18 DIAGNOSIS — J441 Chronic obstructive pulmonary disease with (acute) exacerbation: Secondary | ICD-10-CM

## 2018-12-18 MED ORDER — DOXYCYCLINE HYCLATE 100 MG PO CAPS: 100.0000 mg | ORAL_CAPSULE | Freq: Two times a day (BID) | ORAL | 0 refills | Status: DC

## 2018-12-18 NOTE — Progress Notes (Signed)
Virtual Visit via Video Note  I connected with [name] on 12/18/18 at 10:00 AM EDT by a video enabled telemedicine application and verified that I am speaking with the correct person using two identifiers.  Location: Patient: In her home, accompanied by her daughter.  Provider: [place]   I discussed the limitations of evaluation and management by telemedicine and the availability of in person appointments. The patient expressed understanding and agreed to proceed.  History of Present Illness: This is a 78 yo female who presents today for virtual visit to discuss uri symptoms. Has had productive cough, wheezing and runny nose x 2-3 days. Has taken mucinex without relief. History of COPD and takes flovent 220 bid and has been using albuterol inhaler twice a day.  She was recently seen by orthopedics and put on prednisone taper that she started yesterday.  She has not had a fever, hears "rattling" in her chest. No increased SOB. Per patient and her daughter, they have not had any potential corona virus exposures.    Past Medical History:  Diagnosis Date  . Acute postoperative pain 02/03/2018  . Anemia   . B12 deficiency   . Bacteremia 10/07/2014  . Bladder incontinence   . Blind left eye   . Cancer ([place])    skin cancer   . Cataract   . Cervical spine fracture ([place])   . Chest pain 07/29/2013  . Compression fracture    C7,  upper T spine -compression fx  . COPD (chronic obstructive pulmonary disease) ([place])   . DDD (degenerative disc disease), lumbar   . Depression    major  . Diffuse myofascial pain syndrome 03/24/2015  . Dyspnea    at times- when activty  . Fever 10/05/2014  . GERD (gastroesophageal reflux disease)   . Hiatal hernia   . Hyperlipidemia   . Hypertension   . 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 Macular degeneration   . On home oxygen therapy    "3L; all the time" (04/30/2018)  . Osteoarthritis   . Osteoporosis   . Pneumonia    years ago  . Pulmonary embolism ([place]) 04/30/2018  . Reactive airway disease   . Rupture of bowel ([place])   . Sepsis ([place]) 10/08/2014  . Stroke [facility])    "mini stroke"- balance and memory issue  . Stroke ([place])   . Wrist pain, acute 09/10/2012   Past Surgical History:  Procedure Laterality Date  . ABDOMINAL HYSTERECTOMY    . ABDOMINAL SURGERY    . APPENDECTOMY    . BLADDER SURGERY    . CATARACT EXTRACTION W/ INTRAOCULAR LENS  IMPLANT, BILATERAL Bilateral   . CHOLECYSTECTOMY    . COLON SURGERY    . COLOSTOMY REVERSAL    . ESOPHAGOGASTRODUODENOSCOPY N/A 10/30/2018   Procedure: ESOPHAGOGASTRODUODENOSCOPY (EGD);  Surgeon: Lin Landsman, MD;  Location: [facility] ENDOSCOPY;  Service: Gastroenterology;  Laterality: N/A;  . INTRAMEDULLARY (IM) NAIL INTERTROCHANTERIC Right 09/13/2016   Procedure: INTRAMEDULLARY (IM) NAIL INTERTROCHANTRIC RIGHT;  Surgeon: Leandrew Koyanagi, MD;  Location: [place];  Service: Orthopedics;  Laterality: Right;  . JOINT REPLACEMENT Bilateral   . KNEE SURGERY    . OSTOMY    . RECTOCELE REPAIR    . TOE SURGERY Right    3rd  . TOTAL HIP ARTHROPLASTY Right 09/30/2017   Procedure: RIGHT HIP IM NAIL REMOVAL WITH CONVERSION TO TOTAL HIP ARTHOPLASTY, anterior approach;  Surgeon: Renette Butters, MD;  Location: [place];  Service: Orthopedics;  Laterality: Right;   Family History  Problem Relation Age of Onset  . Heart failure Mother   . Heart attack Father   . Lung cancer Sister   . Deep vein thrombosis Sister   . Pulmonary embolism Son   . Deep vein thrombosis Son   . Cervical cancer Daughter    Social History   Tobacco Use  . Smoking status: Former Smoker    Packs/day: 1.50    Years: 30.00    Pack years: 45.00    Types: E-cigarettes, Cigarettes    Quit date: 2013    Years since quitting: 7.5  . Smokeless tobacco: Never Used  Substance Use Topics  . Alcohol use: No    Alcohol/week: 0.0 standard drinks  . Drug use: No      Observations/Objective: The patient  answers questions appropriately, she is able to speak in complete sentences. Occasional dry sounding cough witnessed.   BP (!) 153/70   Pulse 68   Temp (!) 97.4 F (36.3 C)   Ht 5\' 4"  (1.626 m)   Wt 180 lb (81.6 kg)   BMI 30.90 kg/m  BP Readings from Last 3 Encounters:  12/18/18 (!) 153/70  10/30/18 (!) 146/79  08/25/18 135/78   Wt Readings from Last 3 Encounters:  12/18/18 180 lb (81.6 kg)  10/29/18 184 lb 15.5 oz (83.9 kg)  08/11/18 153 lb 12.8 oz (69.8 kg)    Assessment and Plan: 1. COPD exacerbation ([place]) - discussed diagnosis with patient and her daughter. She is currently on prednisone and discussed starting antibiotic if worsening or no improvement in 5 days.  - doxycycline (VIBRAMYCIN) 100 MG capsule; Take 1 capsule (100 mg total) by mouth 2 (two) times daily.  Dispense: 14 capsule; Refill: 0 - RTC/ER/ UC precautions reviewed  Clarene Reamer, FNP-BC  [facility] at [facility]  12/19/2018 8:43 AM   Follow Up Instructions:    I discussed the assessment and treatment plan with the patient. The patient was provided an opportunity to ask questions and all were answered. The patient agreed with the plan and demonstrated an understanding of the instructions.   The patient was advised to call back or seek an in-person evaluation if the symptoms worsen or if the condition fails to improve as anticipated.  [name], FNP

## 2018-12-21 IMAGING — MR MR LUMBAR SPINE W/O CM
8 of 11 series · 36 of 48 positions shown · non-contrast
Comparison: None available.

CLINICAL DATA: Initial evaluation for low back pain radiating into
right hip and lower extremity. Recent right hip fracture.

EXAM:
MRI THORACIC AND LUMBAR SPINE WITHOUT CONTRAST
TECHNIQUE: Multiplanar and multiecho pulse sequences of the thoracic and lumbar
spine were obtained without intravenous contrast.

[Series 2: T2 · sagittal · 4.0mm · 0.81mm/px · 4 of 19 slices shown (1 of 4)]
[im 1/19]
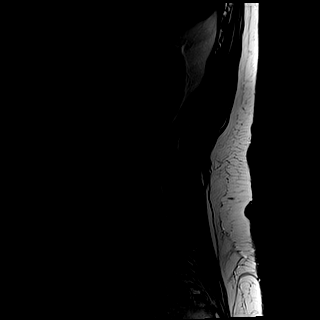
[im 7/19]
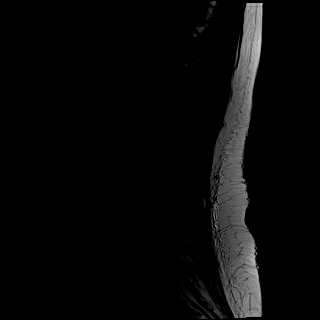
[im 13/19]
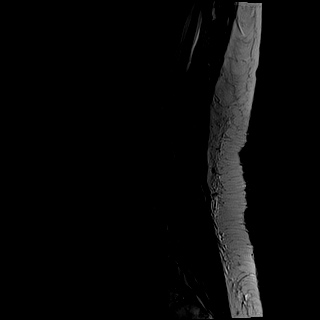
[im 19/19]
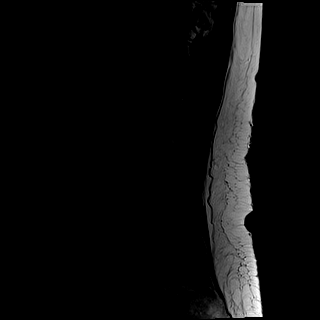

[Series 3: T1 · sagittal · 4.0mm · 0.81mm/px · 4 of 19 slices shown (1 of 3)]
[im 1/19]
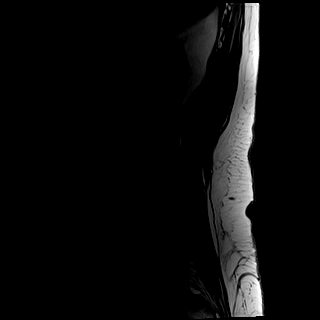
[im 7/19]
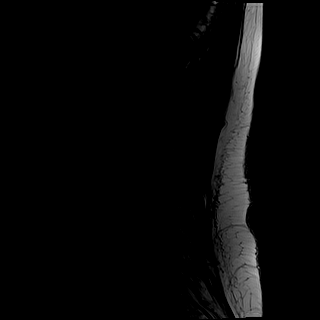
[im 13/19]
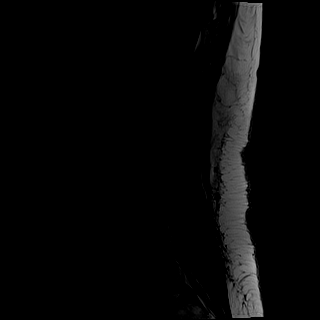
[im 19/19]
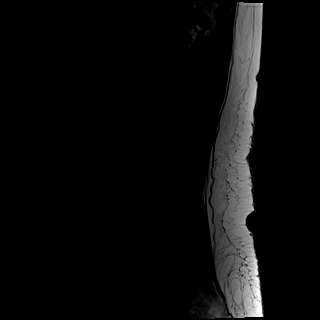

[Series 4: STIR · sagittal · 4.0mm · 1.02mm/px · 2 of 19 slices shown]
[im 1/19]
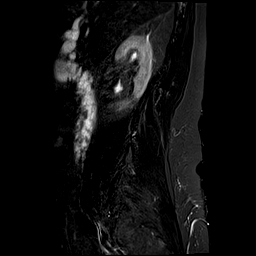
[im 7/19]
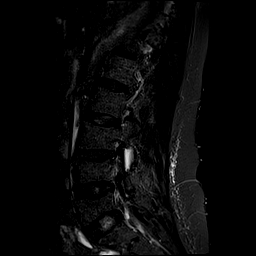

[Series 5: T2 · axial · 4.0mm · 0.78mm/px · z∈[-4,+216]mm · 8 of 41 slices shown (2 of 4)]
[im 1/41]
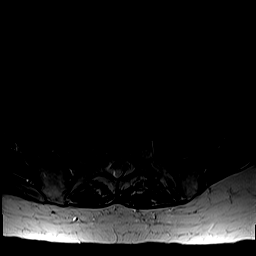
[im 6/41]
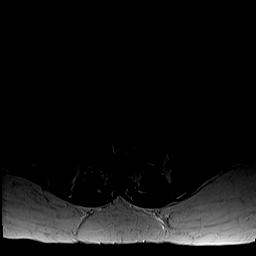
[im 12/41]
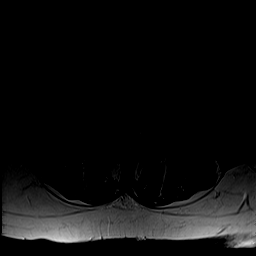
[im 18/41]
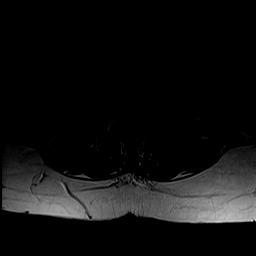
[im 23/41]
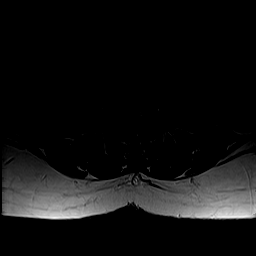
[im 29/41]
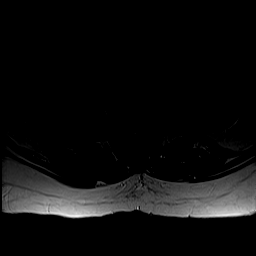
[im 35/41]
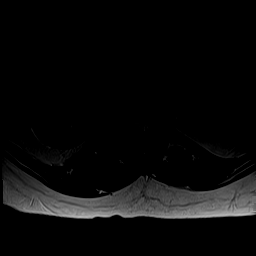
[im 41/41]
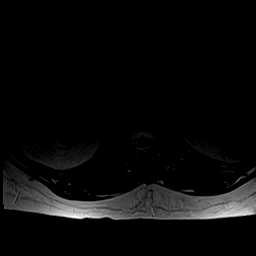

[Series 6: T1 · axial · 4.0mm · 0.39mm/px · z∈[-4,+216]mm · 7 of 41 slices shown (2 of 3)]
[im 1/41]
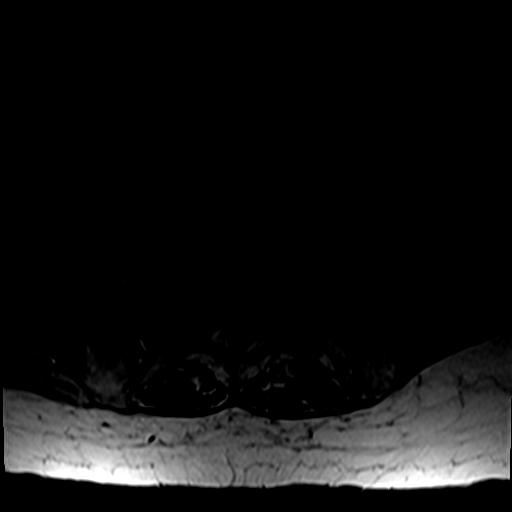
[im 7/41]
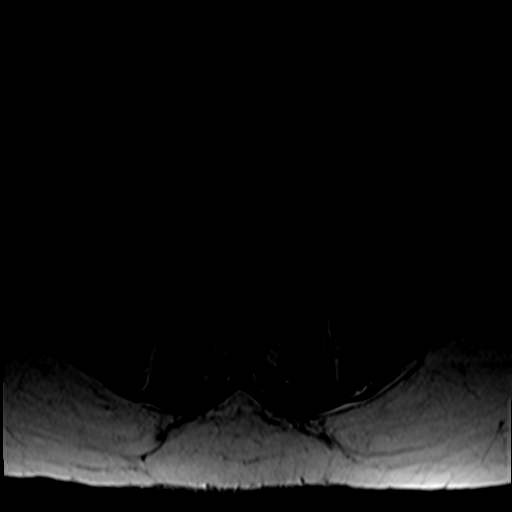
[im 14/41]
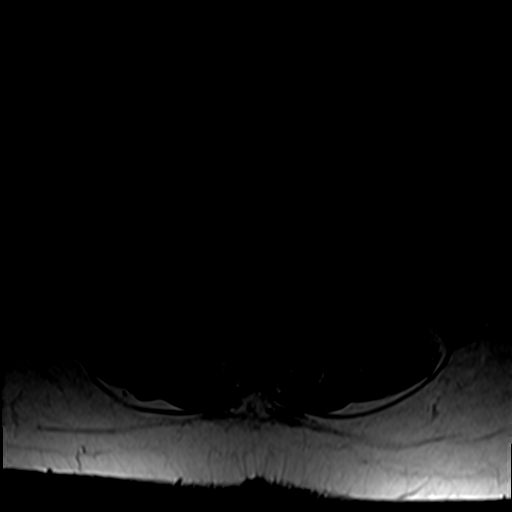
[im 21/41]
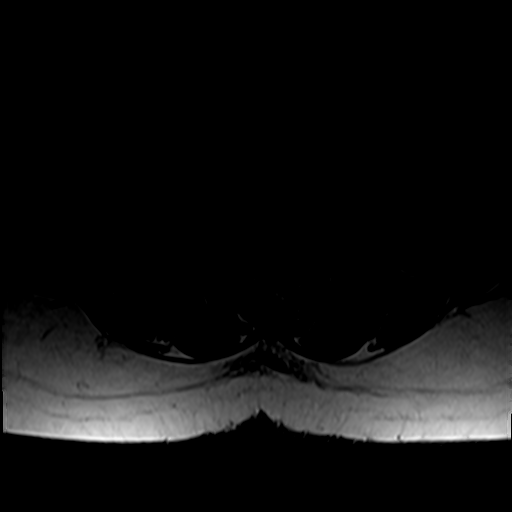
[im 27/41]
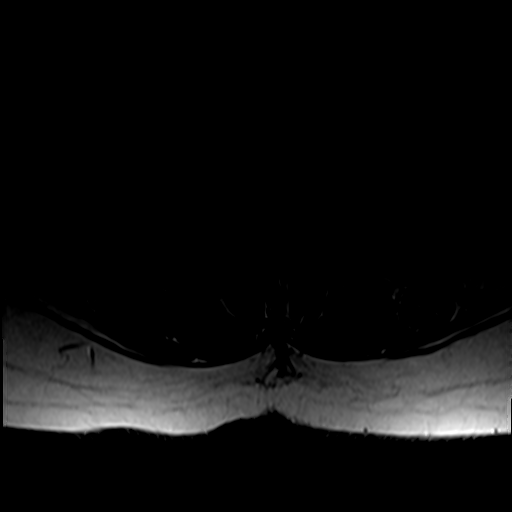
[im 34/41]
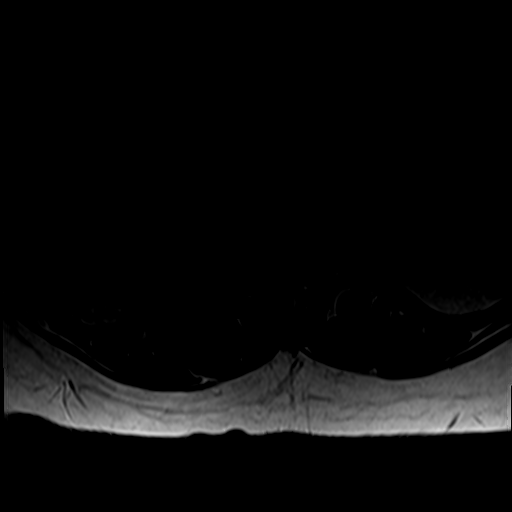
[im 41/41]
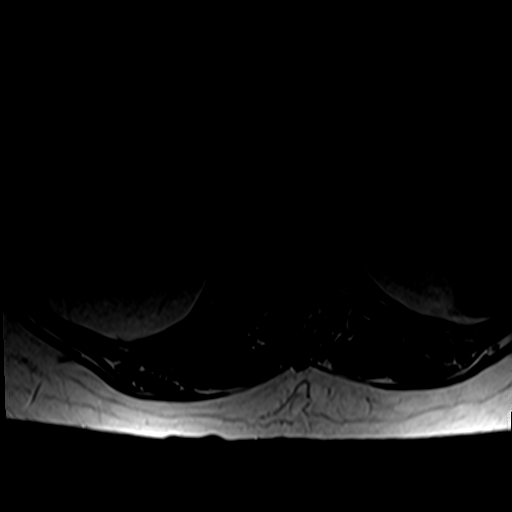

[Series 11: T2 · sagittal · 4.0mm · 1.00mm/px · 2 of 13 slices shown (3 of 4)]
[im 1/13]
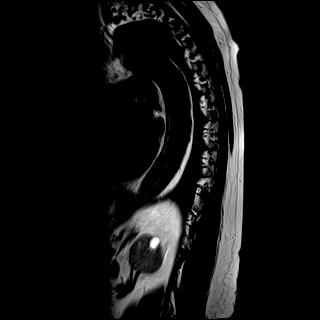
[im 13/13]
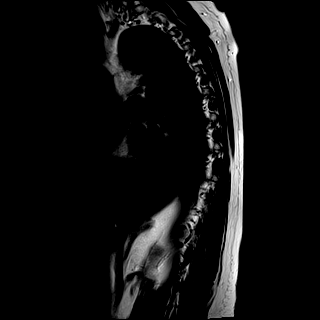

[Series 12: T1 · sagittal · 4.0mm · 1.06mm/px · 2 of 13 slices shown (3 of 3)]
[im 1/13]
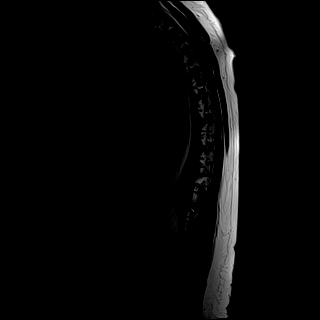
[im 13/13]
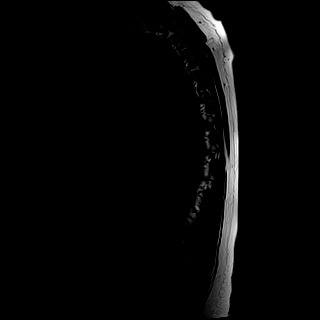

[Series 14: T2 · axial · 5.0mm · 0.86mm/px · z∈[-306,-92]mm · 7 of 40 slices shown (4 of 4)]
[im 1/40]
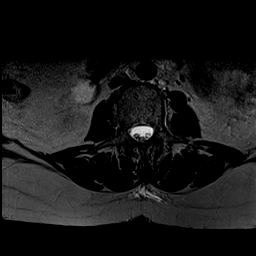
[im 7/40]
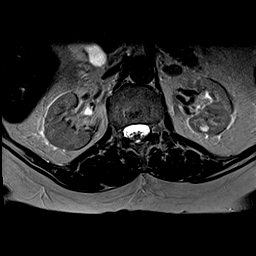
[im 14/40]
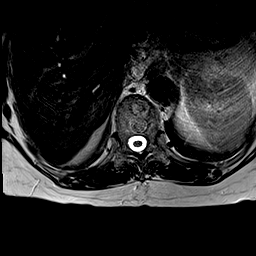
[im 20/40]
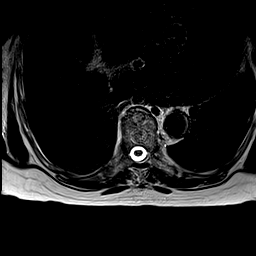
[im 27/40]
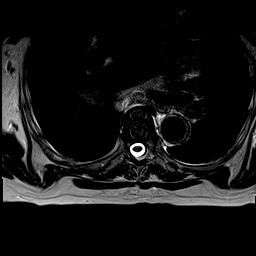
[im 33/40]
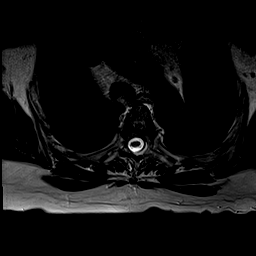
[im 40/40]
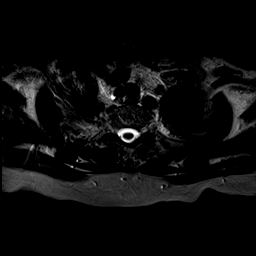

[36 of 48 positions shown; findings below may reference images not displayed]

FINDINGS: MRI THORACIC SPINE FINDINGS

Alignment: Mild exaggeration of the normal thoracic kyphosis. No
listhesis or subluxation.

Vertebrae: Mild chronic height loss at the T1 through T4 vertebral
bodies. Vertebral body heights otherwise maintained. No evidence for
acute for subacute fracture. Bone marrow signal intensity within
normal limits. Prominent 14 mm hemangioma noted within the T6
vertebral body. No other discrete or worrisome osseous lesions.

Cord:  Signal intensity within the thoracic spinal cord is normal.

Paraspinal and other soft tissues: Paraspinous soft tissues within
normal limits. Partially visualized lungs are grossly clear.
Scattered T2 hyperintense cyst noted within the kidneys bilaterally.

Disc levels:

T9-10: Tiny left paracentral disc protrusion indents the ventral
thecal sac (series 15, image 26). No stenosis.

T11-12: Shallow central disc protrusion mildly flattens the ventral
thecal sac (series 15, image 33). No stenosis.

No other significant degenerative changes within the thoracic spine.
No canal or foraminal stenosis.

MRI LUMBAR SPINE FINDINGS

Segmentation: When counting from the dens, there appear to be only
11 pairs of ribs. For the purposes of this dictation, the lowest
well-formed disc space will be labeled the L5-S1 level, and the
highest non rib-bearing vertebral body will be labeled L1.

Alignment: Mild rotoscoliosis. Vertebral bodies otherwise normally
aligned with preservation of the normal lumbar lordosis. No
listhesis.

Vertebrae: Vertebral body heights are maintained. No evidence for
acute or chronic fracture. The bone marrow signal intensity within
normal limits. No discrete or worrisome osseous lesions. No abnormal
marrow edema.

Conus medullaris: Extends to the L1-2 level and appears normal.

Paraspinal and other soft tissues: Paraspinous soft tissues
demonstrate no acute abnormality. Scattered T2 hyperintense cyst
noted within the kidneys, greater on the left. Visualized visceral
structures otherwise unremarkable.

Disc levels:

L1-2:  Unremarkable.

L2-3:  Unremarkable.

L3-4: Diffuse degenerative disc bulge with disc desiccation. Annular
fissure noted at the level of the right neural foramen. Moderate
facet and ligamentum flavum hypertrophy. Reactive effusions present
within the bilateral L3-4 facets. Mild canal and bilateral foraminal
stenosis without neural impingement.

L4-5: Mild disc bulge with disc desiccation. Small posterior annular
fissure noted. Mild facet and ligamentum flavum hypertrophy. No
significance canal stenosis. Mild bilateral L4 foraminal narrowing
without impingement.

L5-S1: Normal disc. Mild facet hypertrophy. No significant stenosis.
IMPRESSION: MR THORACIC SPINE IMPRESSION

1. Mild degenerative disc bulging at T9-10 and T11-12 without
significant stenosis.
2. Otherwise unremarkable MRI of the thoracic spine. No other
significant disc pathology. No canal or neural foraminal stenosis.

MR LUMBAR SPINE IMPRESSION

1. Degenerative disc bulge with facet hypertrophy at L3-4 with
resultant mild canal and bilateral foraminal stenosis.
2. Degenerative disc bulge at L4-5 with resultant mild bilateral L4
foraminal narrowing.
3. Otherwise negative MRI of the lumbar spine. No other significant
stenosis or evidence for neural impingement.

## 2018-12-24 ENCOUNTER — Ambulatory Visit: Admission: RE | Admit: 2018-12-24 | Payer: Medicare Other | Source: Ambulatory Visit

## 2018-12-29 ENCOUNTER — Encounter: Payer: Self-pay | Admitting: Family Medicine

## 2019-01-01 ENCOUNTER — Other Ambulatory Visit: Payer: Self-pay | Admitting: Family Medicine

## 2019-01-05 ENCOUNTER — Other Ambulatory Visit: Payer: Self-pay | Admitting: Family Medicine

## 2019-01-05 ENCOUNTER — Inpatient Hospital Stay (HOSPITAL_BASED_OUTPATIENT_CLINIC_OR_DEPARTMENT_OTHER): Payer: Medicare Other | Admitting: Oncology

## 2019-01-05 ENCOUNTER — Other Ambulatory Visit: Payer: Self-pay

## 2019-01-05 ENCOUNTER — Encounter: Payer: Self-pay | Admitting: Oncology

## 2019-01-05 ENCOUNTER — Inpatient Hospital Stay: Payer: Medicare Other | Attending: Oncology

## 2019-01-05 VITALS — BP 126/80 | HR 65 | Temp 96.8°F | Resp 16 | Wt 183.0 lb

## 2019-01-05 DIAGNOSIS — I519 Heart disease, unspecified: Secondary | ICD-10-CM | POA: Diagnosis not present

## 2019-01-05 DIAGNOSIS — I82543 Chronic embolism and thrombosis of tibial vein, bilateral: Secondary | ICD-10-CM | POA: Diagnosis not present

## 2019-01-05 DIAGNOSIS — M792 Neuralgia and neuritis, unspecified: Secondary | ICD-10-CM

## 2019-01-05 DIAGNOSIS — J449 Chronic obstructive pulmonary disease, unspecified: Secondary | ICD-10-CM | POA: Diagnosis not present

## 2019-01-05 DIAGNOSIS — Z8049 Family history of malignant neoplasm of other genital organs: Secondary | ICD-10-CM | POA: Diagnosis not present

## 2019-01-05 DIAGNOSIS — Z801 Family history of malignant neoplasm of trachea, bronchus and lung: Secondary | ICD-10-CM | POA: Diagnosis not present

## 2019-01-05 DIAGNOSIS — Z86718 Personal history of other venous thrombosis and embolism: Secondary | ICD-10-CM | POA: Insufficient documentation

## 2019-01-05 DIAGNOSIS — I82A23 Chronic embolism and thrombosis of axillary vein, bilateral: Secondary | ICD-10-CM

## 2019-01-05 DIAGNOSIS — Z87891 Personal history of nicotine dependence: Secondary | ICD-10-CM | POA: Insufficient documentation

## 2019-01-05 DIAGNOSIS — I2699 Other pulmonary embolism without acute cor pulmonale: Secondary | ICD-10-CM | POA: Insufficient documentation

## 2019-01-05 DIAGNOSIS — M7918 Myalgia, other site: Secondary | ICD-10-CM

## 2019-01-05 DIAGNOSIS — G8929 Other chronic pain: Secondary | ICD-10-CM

## 2019-01-05 DIAGNOSIS — Z7901 Long term (current) use of anticoagulants: Secondary | ICD-10-CM | POA: Insufficient documentation

## 2019-01-05 LAB — COMPREHENSIVE METABOLIC PANEL
ALT: 21 U/L (ref 0–44)
AST: 22 U/L (ref 15–41)
Albumin: 3.4 g/dL — ABNORMAL LOW (ref 3.5–5.0)
Alkaline Phosphatase: 95 U/L (ref 38–126)
Anion gap: 7 (ref 5–15)
BUN: 9 mg/dL (ref 8–23)
CO2: 28 mmol/L (ref 22–32)
Calcium: 9.4 mg/dL (ref 8.9–10.3)
Chloride: 107 mmol/L (ref 98–111)
Creatinine, Ser: 0.99 mg/dL (ref 0.44–1.00)
GFR calc Af Amer: >60 mL/min (ref >60)
GFR calc non Af Amer: 55 mL/min — ABNORMAL LOW (ref >60)
Glucose, Bld: 101 mg/dL — ABNORMAL HIGH (ref 70–99)
Potassium: 4.1 mmol/L (ref 3.5–5.1)
Sodium: 142 mmol/L (ref 135–145)
Total Bilirubin: 0.4 mg/dL (ref 0.3–1.2)
Total Protein: 7.4 g/dL (ref 6.5–8.1)

## 2019-01-05 LAB — CBC WITH DIFFERENTIAL/PLATELET
Abs Immature Granulocytes: 0.01 10*3/uL (ref 0.00–0.07)
Basophils Absolute: 0.1 10*3/uL (ref 0.0–0.1)
Basophils Relative: 1 %
Eosinophils Absolute: 0.2 10*3/uL (ref 0.0–0.5)
Eosinophils Relative: 4 %
HCT: 35.6 % — ABNORMAL LOW (ref 36.0–46.0)
Hemoglobin: 10.8 g/dL — ABNORMAL LOW (ref 12.0–15.0)
Immature Granulocytes: 0 %
Lymphocytes Relative: 36 %
Lymphs Abs: 1.7 10*3/uL (ref 0.7–4.0)
MCH: 25.8 pg — ABNORMAL LOW (ref 26.0–34.0)
MCHC: 30.3 g/dL (ref 30.0–36.0)
MCV: 85.2 fL (ref 80.0–100.0)
Monocytes Absolute: 0.5 10*3/uL (ref 0.1–1.0)
Monocytes Relative: 11 %
Neutro Abs: 2.2 10*3/uL (ref 1.7–7.7)
Neutrophils Relative %: 48 %
Platelets: 186 10*3/uL (ref 150–400)
RBC: 4.18 MIL/uL (ref 3.87–5.11)
RDW: 15.8 % — ABNORMAL HIGH (ref 11.5–15.5)
WBC: 4.7 10*3/uL (ref 4.0–10.5)
nRBC: 0 % (ref 0.0–0.2)

## 2019-01-05 NOTE — Progress Notes (Signed)
Hematology/Oncology Consult note [facility] [phone] [phone]   Patient Care Team: [name], FNP as PCP - General (Nurse Practitioner)  REFERRING PROVIDER: [name], FNP  CHIEF COMPLAINTS/REASON FOR VISIT:  Evaluation of pulmonary embolism.   HISTORY OF PRESENTING ILLNESS:  [name] is a  78 y.o.  female with PMH listed below who was referred to me for evaluation of pulmonary embolism.  PE was diagnosed 04/30/2018, CT chest angiogram showed pulmonary embolism with right heart strain..  05/02/2018 US duplex lower extremities showed  Right: age indeterminate deep vein thrombosis involving the right posterior tibial vein, and right peroneal vein.  Left: acute deep vein thrombosis involving the left posterior tibial vein, and left peroneal vein.  She is on Eliquis 67m BID currently.  PE and DVT events occurred shortly after her hospitalization due to pneumonia.  Patient has chronic ambulation difficulty after her right hip arthroplasty  in May 2019. She subsequently had several complications immediately postoperative of fecal impaction and ileus.  This was done after an intertrochanteric hip fracture on the right side.She has had multiple ER visits over the past several months.  Also complains chronic lower extremities "soreness" for several month.   Denies any previous history of other thrombosis events. Deneis dami Per daughter, patient's son also developed "leg clot".   INTERVAL HISTORY Nicole MANDRELLis a 78y.o. female who has above history reviewed by me today presents for follow up visit for management of pulmonary embolism, chronic lower extremity DVT. Problems and complaints are listed below: Per patient's request, attempted to call patient's daughter [name] not able to reach her. Patient had a virtual visit with me on 10/06/2018.  At that point, patient was recommended to take Eliquis 5 mg twice daily till  June 2020 and then switch to 2.5 mg twice daily. Patient denies new complaints today.  Chronic fatigue at baseline. [place]. Today denies any fever, chills.   Review of Systems  Constitutional: Positive for fatigue. Negative for appetite change, chills and fever.  HENT:   Negative for hearing loss and voice change.   Eyes: Negative for eye problems.  Respiratory: Positive for shortness of breath. Negative for chest tightness and cough.   Cardiovascular: Negative for chest pain.  Gastrointestinal: Negative for abdominal distention, abdominal pain and blood in stool.  Endocrine: Negative for hot flashes.  Genitourinary: Negative for difficulty urinating and frequency.   Musculoskeletal: Positive for arthralgias.       Bilateral lower extremities soreness  Skin: Negative for itching and rash.  Neurological: Negative for extremity weakness.  Hematological: Negative for adenopathy.  Psychiatric/Behavioral: Negative for confusion.    MEDICAL HISTORY:  Past Medical History:  Diagnosis Date  . Acute postoperative pain 02/03/2018  . Anemia   . B12 deficiency   . Bacteremia 10/07/2014  . Bladder incontinence   . Blind left eye   . Cancer ([place]    skin cancer   . Cataract   . Cervical spine fracture ([place]   . Chest pain 07/29/2013  . Compression fracture    C7,  upper T spine -compression fx  . COPD (chronic obstructive pulmonary disease) ([place]   . DDD (degenerative disc disease), lumbar   . Depression    major  . Diffuse myofascial pain syndrome 03/24/2015  . Dyspnea    at times- when activty  . Fever 10/05/2014  . GERD (gastroesophageal reflux disease)   .  Hiatal hernia   . Hyperlipidemia   . Hypertension   . 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 Macular degeneration   . On home oxygen therapy    "3L; all the time" (04/30/2018)  . Osteoarthritis   . Osteoporosis   . Pneumonia    years ago   . Pulmonary embolism ([place]) 04/30/2018  . Reactive airway disease   . Rupture of bowel ([place])   . Sepsis ([place]) 10/08/2014  . Stroke [facility])    "mini stroke"- balance and memory issue  . Stroke ([place])   . Wrist pain, acute 09/10/2012    SURGICAL HISTORY: Past Surgical History:  Procedure Laterality Date  . ABDOMINAL HYSTERECTOMY    . ABDOMINAL SURGERY    . APPENDECTOMY    . BLADDER SURGERY    . CATARACT EXTRACTION W/ INTRAOCULAR LENS  IMPLANT, BILATERAL Bilateral   . CHOLECYSTECTOMY    . COLON SURGERY    . COLOSTOMY REVERSAL    . ESOPHAGOGASTRODUODENOSCOPY N/A 10/30/2018   Procedure: ESOPHAGOGASTRODUODENOSCOPY (EGD);  Surgeon: Lin Landsman, MD;  Location[facility] ENDOSCOPY;  Service: Gastroenterology;  Laterality: N/A;  . INTRAMEDULLARY (IM) NAIL INTERTROCHANTERIC Right 09/13/2016   Procedure: INTRAMEDULLARY (IM) NAIL INTERTROCHANTRIC RIGHT;  Surgeon: Leandrew Koyanagi, MD;  Location: [place];  Service: Orthopedics;  Laterality: Right;  . JOINT REPLACEMENT Bilateral   . KNEE SURGERY    . OSTOMY    . RECTOCELE REPAIR    . TOE SURGERY Right    3rd  . TOTAL HIP ARTHROPLASTY Right 09/30/2017   Procedure: RIGHT HIP IM NAIL REMOVAL WITH CONVERSION TO TOTAL HIP ARTHOPLASTY, anterior approach;  Surgeon: Renette Butters, MD;  Location: [place];  Service: Orthopedics;  Laterality: Right;    SOCIAL HISTORY: Social History   Socioeconomic History  . Marital status: Widowed    Spouse name: Not on file  . Number of children: 3  . Years of education: middle sch  . Highest education level: Not on file  Occupational History  . Occupation: retired    Comment: n/a  Social Needs  . Financial resource strain: Not on file  . Food insecurity    Worry: Not on file    Inability: Not on file  . Transportation needs    Medical: Not on file    Non-medical: Not on file  Tobacco Use  . Smoking status: Former Smoker    Packs/day: 1.50    Years: 30.00    Pack years: 45.00    Types: E-cigarettes,  Cigarettes    Quit date: 2013    Years since quitting: 7.6  . Smokeless tobacco: Never Used  Substance and Sexual Activity  . Alcohol use: No    Alcohol/week: 0.0 standard drinks  . Drug use: No  . Sexual activity: Not Currently  Lifestyle  . Physical activity    Days per week: Not on file    Minutes per session: Not on file  . Stress: Not on file  Relationships  . Social Herbalist on phone: Not on file    Gets together: Not on file    Attends religious service: Not on file    Active member of club or organization: Not on file    Attends meetings of clubs or organizations: Not on file    Relationship status: Not on file  . Intimate partner violence    Fear of current or ex partner: Not on file    Emotionally abused: Not on file  Physically abused: Not on file    Forced sexual activity: Not on file  Other Topics Concern  . Not on file  Social History Narrative   ** Merged History Encounter **       Patient lives at home with daughter. Caffeine Use: 4 cups daily    FAMILY HISTORY: Family History  Problem Relation Age of Onset  . Heart failure Mother   . Heart attack Father   . Lung cancer Sister   . Deep vein thrombosis Sister   . Pulmonary embolism Son   . Deep vein thrombosis Son   . Cervical cancer Daughter     ALLERGIES:  is allergic to ampicillin; ampicillin-sulbactam sodium; ambien [zolpidem tartrate]; flexeril [cyclobenzaprine]; tape; toradol [ketorolac tromethamine]; sulfamethoxazole-trimethoprim; zolpidem; cephalexin; and tramadol.  MEDICATIONS:  Current Outpatient Medications  Medication Sig Dispense Refill  . acetaminophen (TYLENOL) 500 MG tablet Take 2 tablets (1,000 mg total) by mouth 3 (three) times daily. 30 tablet 0  . albuterol (ACCUNEB) 1.25 MG/3ML nebulizer solution Take 3 mLs (1.25 mg total) by nebulization every 6 (six) hours as needed for wheezing. 75 mL 1  . albuterol (VENTOLIN HFA) 108 (90 Base) MCG/ACT inhaler Inhale 2 puffs  into the lungs every 6 (six) hours as needed for wheezing or shortness of breath. 1 Inhaler 2  . alendronate (FOSAMAX) 70 MG tablet TAKE 1 TABLET BY MOUTH ONCE A WEEK 12 tablet 1  . ALPRAZolam (XANAX) 1 MG tablet TAKE 1 TABLET (1 MG TOTAL) BY MOUTH AT BEDTIME AS NEEDED FOR SLEEP. 30 tablet 1  . amLODipine (NORVASC) 5 MG tablet TAKE 1 TABLET BY MOUTH EVERY DAY 90 tablet 1  . apixaban (ELIQUIS) 2.5 MG TABS tablet Take 1 tablet (2.5 mg total) by mouth 2 (two) times daily. 60 tablet 3  . Calcium Carbonate (CALCIUM 600 PO) Take 1 tablet by mouth daily.    . Cholecalciferol (VITAMIN D-3 PO) Take 2,000 Units by mouth daily.    . citalopram (CELEXA) 20 MG tablet Take 1.5 tablets (30 mg total) by mouth daily. (Patient taking differently: Take 20 mg by mouth daily. ) 135 tablet 1  . docusate sodium (COLACE) 100 MG capsule Take 200 mg by mouth 2 (two) times daily as needed for moderate constipation.     Marland Kitchen esomeprazole (NEXIUM) 40 MG capsule Take 40 mg by mouth daily. Sometimes twice daily    . fluticasone (FLOVENT HFA) 220 MCG/ACT inhaler Inhale 1 puff into the lungs 2 (two) times daily. 1 Inhaler 11  . Krill Oil 500 MG CAPS Take 500 mg by mouth daily.     Marland Kitchen LINZESS 290 MCG CAPS capsule TAKE 1 CAPSULE (290 MCG TOTAL) BY MOUTH EVERY MORNING BEFORE BREAKFAST KEEP IN ORIGINAL BOTTLE. 90 capsule 1  . lovastatin (MEVACOR) 10 MG tablet Take 1 tablet (10 mg total) by mouth daily. 90 tablet 3  . NARCAN 4 MG/0.1ML LIQD nasal spray kit     . ondansetron (ZOFRAN ODT) 4 MG disintegrating tablet Take 1 tablet (4 mg total) by mouth every 8 (eight) hours as needed for nausea or vomiting. 30 tablet 0  . Oxycodone HCl 10 MG TABS Take 10 mg by mouth 3 (three) times daily.    . polyethylene glycol (MIRALAX / GLYCOLAX) packet Take 17 g by mouth daily as needed (for constipation.).     Marland Kitchen traZODone (DESYREL) 50 MG tablet Take 0.5-1 tablets (25-50 mg total) by mouth at bedtime as needed for sleep. 30 tablet 3  . vitamin B-12  (  CYANOCOBALAMIN) 500 MCG tablet Take 500 mcg by mouth daily.    Marland Kitchen doxycycline (VIBRAMYCIN) 100 MG capsule Take 1 capsule (100 mg total) by mouth 2 (two) times daily. (Patient not taking: Reported on 01/05/2019) 14 capsule 0  . lidocaine-prilocaine (EMLA) cream     . pregabalin (LYRICA) 100 MG capsule Take 1 capsule (100 mg total) by mouth 2 (two) times daily. 60 capsule 5   No current facility-administered medications for this visit.      PHYSICAL EXAMINATION: ECOG PERFORMANCE STATUS: 3 - Symptomatic, >50% confined to bed Vitals:   01/05/19 1016  BP: 126/80  Pulse: 65  Resp: 16  Temp: (!) 96.8 F (36 C)   Filed Weights   01/05/19 1016  Weight: 183 lb (83 kg)    Physical Exam Constitutional:      General: She is not in acute distress.    Appearance: She is ill-appearing.     Comments: Sits in wheelchair  Eyes:     General: No scleral icterus.    Pupils: Pupils are equal, round, and reactive to light.  Neck:     Musculoskeletal: Normal range of motion and neck supple.  Cardiovascular:     Rate and Rhythm: Normal rate and regular rhythm.     Heart sounds: Normal heart sounds.  Pulmonary:     Effort: Pulmonary effort is normal. No respiratory distress.     Breath sounds: No wheezing.     Comments: Bibasilar crackles Abdominal:     General: Bowel sounds are normal. There is no distension.     Palpations: Abdomen is soft. There is no mass.     Tenderness: There is no abdominal tenderness.  Musculoskeletal: Normal range of motion.        General: Swelling present. No deformity.  Skin:    General: Skin is warm and dry.     Findings: No erythema or rash.  Neurological:     Mental Status: She is alert and oriented to person, place, and time.     Cranial Nerves: No cranial nerve deficit.     Coordination: Coordination normal.      LABORATORY DATA:  I have reviewed the data as listed Lab Results  Component Value Date   WBC 4.7 01/05/2019   HGB 10.8 (L) 01/05/2019    HCT 35.6 (L) 01/05/2019   MCV 85.2 01/05/2019   PLT 186 01/05/2019   Recent Labs    05/17/18 1126 10/29/18 1037 10/30/18 0339 11/20/18 1110 01/05/19 0959  NA 139 144 142 142 142  K 3.6 3.4* 3.3* 3.8 4.1  CL 106 111 110 106 107  CO2 24 21* '25 29 28  ' GLUCOSE 65* 106* 86 78 101*  BUN 7* '8 10 11 9  ' CREATININE 0.94 0.98 1.08* 1.21* 0.99  CALCIUM 8.9 9.6 8.3* 9.2 9.4  GFRNONAA 59* 56* 49*  --  55*  GFRAA >60 >60 57*  --  >60  PROT 7.2 7.5  --   --  7.4  ALBUMIN 3.0* 3.5  --   --  3.4*  AST 26 25  --   --  22  ALT 12 15  --   --  21  ALKPHOS 51 86  --   --  95  BILITOT 0.6 0.6  --   --  0.4   Iron/TIBC/Ferritin/ %Sat    Component Value Date/Time   IRON 45 10/29/2018 1812   TIBC 336 10/29/2018 1812   FERRITIN 17 10/29/2018 1812   IRONPCTSAT 13 10/29/2018  [place]: I have personally reviewed the radiological images as listed and agreed with the findings in the report. 04/30/2018 CT chest Angiogram  1. Positive for acute PE with CT evidence of right heart strain (RV/LV Ratio = 1.15) consistent with at least submassive (intermediate risk) PE. The presence of right heart strain has been associated with an increased risk of morbidity and mortality. 2. Extensive bilateral patchy areas of ground-glass attenuation and airspace consolidation which may represent areas of alveolar edema, hemorrhage and/or infection. 3.  Aortic Atherosclerosis (ICD10-I70.0). 127/2020 VAS [place] lower extremities.  Right: Findings consistent with age indeterminate deep vein thrombosis involving the right posterior tibial vein, and right peroneal vein. No cystic structure found in the popliteal fossa. Left: Findings consistent with acute deep vein thrombosis involving the left posterior tibial vein, and left peroneal vein. No cystic structure found in the popliteal fossa. ASSESSMENT & PLAN:  1. PE (pulmonary thromboembolism) ([place])   2. History of deep vein thrombosis of lower extremity    Labs reviewed and discussed with patient. She has stable hemoglobin, level at 10.8 today No reports of bleeding events. Recommend patient to continue Eliquis 2.5 mg twice daily for maintenance anticoagulation.  Chronic lower extremity swelling, recommend compression stocking. Attempted to call daughter to update her, not able to reach her.  Orders Placed This Encounter  Procedures  . CBC with Differential/Platelet    Standing Status:   Future    Standing Expiration Date:   01/05/2020  . Comprehensive metabolic panel    Standing Status:   Future    Standing Expiration Date:   01/05/2020    All questions were answered. The patient knows to call the clinic with any problems questions or concerns.  Return of visit:  3 months.    Earlie Server, MD, PhD Hematology Oncology [facility] 01/05/2019

## 2019-01-05 NOTE — Telephone Encounter (Signed)
Last office visit 12/18/2018 for COPD exacerbation.  Last refilled 06/25/2018 for #60 with 5 refills by Dr. Lorelei Pont.  No future appointments.

## 2019-01-05 NOTE — Progress Notes (Signed)
Patient does not offer any problems today at hematology standpoint.  Please call her [name] during visit at [phone]

## 2019-01-12 ENCOUNTER — Encounter: Payer: Self-pay | Admitting: Family Medicine

## 2019-01-14 ENCOUNTER — Encounter: Payer: Self-pay | Admitting: Family Medicine

## 2019-01-14 NOTE — Telephone Encounter (Signed)
Please advise if you want a virtual visit

## 2019-01-15 ENCOUNTER — Other Ambulatory Visit: Payer: Self-pay | Admitting: Family Medicine

## 2019-01-18 ENCOUNTER — Other Ambulatory Visit: Payer: Self-pay | Admitting: Family Medicine

## 2019-01-18 DIAGNOSIS — S72141S Displaced intertrochanteric fracture of right femur, sequela: Secondary | ICD-10-CM

## 2019-01-18 DIAGNOSIS — G8929 Other chronic pain: Secondary | ICD-10-CM

## 2019-01-18 DIAGNOSIS — S72001S Fracture of unspecified part of neck of right femur, sequela: Secondary | ICD-10-CM

## 2019-01-22 ENCOUNTER — Telehealth: Payer: Self-pay

## 2019-01-22 ENCOUNTER — Ambulatory Visit: Payer: Medicare Other | Admitting: Family Medicine

## 2019-01-22 NOTE — Telephone Encounter (Signed)
Shelbie Proctor, RN with [facility] left a message on triage line stating she went to see the pt today to do her assessment and found the pt to be severely depressed due to her chronic health conditions. Pt and daughter advised her that she has been on Celexa for 2 years. Pt does not have any suicidal ideations. She said we need to call and talk to her about her depression and what we can do for her. Pt's number is [phone].

## 2019-01-22 NOTE — Telephone Encounter (Signed)
Noted  

## 2019-01-22 NOTE — Telephone Encounter (Signed)
Spoke with pt's daughter, [name] (on dpr), asking if pt is ok.  Says pt is no longer hoarse and they forgot to cancel appt.  Says she will talk with pt to see and will call back to r/s if she still wants to be seen.

## 2019-01-30 ENCOUNTER — Other Ambulatory Visit: Payer: Self-pay | Admitting: Family Medicine

## 2019-02-02 ENCOUNTER — Other Ambulatory Visit: Payer: Self-pay | Admitting: Family Medicine

## 2019-02-02 NOTE — Telephone Encounter (Signed)
LAST FILLED on 11/23/2018 #30 with 1 refill LOV 12/18/2018. No future appointments.

## 2019-02-03 NOTE — Telephone Encounter (Signed)
Last filled on 12/09/2018 but for 30 day supply at a time. Pharmacy sending over request to dispense 90 day supply. Please review if it is ok for the change?  LOV 7/24/200. No future appointments on file.

## 2019-02-08 ENCOUNTER — Other Ambulatory Visit: Payer: Self-pay | Admitting: Family Medicine

## 2019-02-08 DIAGNOSIS — Z96651 Presence of right artificial knee joint: Secondary | ICD-10-CM

## 2019-02-08 DIAGNOSIS — G8929 Other chronic pain: Secondary | ICD-10-CM

## 2019-02-08 DIAGNOSIS — M899 Disorder of bone, unspecified: Secondary | ICD-10-CM

## 2019-02-08 NOTE — Telephone Encounter (Signed)
Spoke with patient's daughter today. Mother doing ok, she felt better both physically and mentally while having PT. She is awaiting appointment at [facility] with hip/knee joint replacement specialist per ortho. Will order PT for her hip/ knee pain, mobility issues.

## 2019-02-08 NOTE — Progress Notes (Signed)
Order placed for outpatient PT.

## 2019-02-22 ENCOUNTER — Encounter: Payer: Self-pay | Admitting: Family Medicine

## 2019-02-22 ENCOUNTER — Ambulatory Visit (INDEPENDENT_AMBULATORY_CARE_PROVIDER_SITE_OTHER): Payer: Medicare Other | Admitting: Family Medicine

## 2019-02-22 VITALS — Temp 97.6°F | Ht 64.0 in | Wt 178.0 lb

## 2019-02-22 DIAGNOSIS — F339 Major depressive disorder, recurrent, unspecified: Secondary | ICD-10-CM

## 2019-02-22 DIAGNOSIS — M79604 Pain in right leg: Secondary | ICD-10-CM

## 2019-02-22 DIAGNOSIS — G8929 Other chronic pain: Secondary | ICD-10-CM | POA: Diagnosis not present

## 2019-02-22 DIAGNOSIS — J22 Unspecified acute lower respiratory infection: Secondary | ICD-10-CM

## 2019-02-22 MED ORDER — PREDNISONE 20 MG PO TABS: 20.0000 mg | ORAL_TABLET | Freq: Every day | ORAL | 0 refills | Status: DC

## 2019-02-22 MED ORDER — DOXYCYCLINE HYCLATE 100 MG PO CAPS: 100.0000 mg | ORAL_CAPSULE | Freq: Two times a day (BID) | ORAL | 0 refills | Status: AC

## 2019-02-22 MED ORDER — BENZONATATE 100 MG PO CAPS: 100.0000 mg | ORAL_CAPSULE | Freq: Three times a day (TID) | ORAL | 0 refills | Status: DC | PRN

## 2019-02-22 MED ORDER — PROMETHAZINE-CODEINE 6.25-10 MG/5ML PO SYRP: 5.0000 mL | ORAL_SOLUTION | Freq: Three times a day (TID) | ORAL | 0 refills | Status: DC | PRN

## 2019-02-22 NOTE — Progress Notes (Signed)
Virtual Visit via Video Note  I connected with [name] on 02/22/19 at 11:00 AM EDT by a video enabled telemedicine application and verified that I am speaking with the correct person using two identifiers.  I was briefly able to have video visit with patient and daughter but the connection was lost in the call had to be completed with audio only.  Location: Patient: At her home.  Her daughter [name] was also involved in the call. Provider: [place]   I discussed the limitations of evaluation and management by telemedicine and the availability of in person appointments. The patient expressed understanding and agreed to proceed.  History of Present Illness: Chief Complaint  Patient presents with  . Cough    x 1 week. Dry at times and then green mucous production at times. Some tightness. Concerned of PNA. O2 dropping at night into lower 90s. Some SOB, worse with exertion. NO fever. Pt using her HFA BID, Mucinex BID - pt has not used her nebulizer (CC given by [name])  This is a 78 year old female with history of COPD who requests virtual visit today to discuss 1 week symptoms of productive cough.  She reports that she is having a lot of thick green sputum which is worse as the day goes on.  She was previously on oxygen but that was discontinued a couple of months ago.  Her daughter reports that pulse oximeter runs in the mid to high 90s most of the day but in the evenings goes down to 90-91.  It does go up with increased deep breathing.  The patient has been using her albuterol inhaler but has not been doing any nebulizer treatments.  She does not believe she has had a fever.  She has been taking Mucinex twice daily but does not feel that it is helping.  The daughter reports audible wheezing.  Patient reports that she feels short of breath with walking short distances.  Neither she nor her daughter have been leaving the house. COVID-19 exposures.  Depression-the patient  feels very discouraged about her current situation and chronic pain with decreased mobility.  Her daughter has been trying to set up a second opinion with another orthopedic surgeon but is having difficulty due to transportation issues.  She has been in touch with social services and is trying to get assistance with transportation.  The patient is going to resume physical therapy this week if able.  Patient reports that she sleeps a lot and the daughter reports that the patient cries frequently throughout the day.  GERD/vomiting-has had some frequent vomiting after meals.  Can keep liquids down and some foods.  Little relief with ondansetron.  No abdominal pain.  Takes is esomeprazole 40 mg 1-2 times a day.  Past Medical History:  Diagnosis Date  . Acute postoperative pain 02/03/2018  . Anemia   . B12 deficiency   . Bacteremia 10/07/2014  . Bladder incontinence   . Blind left eye   . Cancer ([place])    skin cancer   . Cataract   . Cervical spine fracture ([place])   . Chest pain 07/29/2013  . Compression fracture    C7,  upper T spine -compression fx  . COPD (chronic obstructive pulmonary disease) ([place])   . DDD (degenerative disc disease), lumbar   . Depression    major  . Diffuse myofascial pain syndrome 03/24/2015  . Dyspnea    at times- when activty  . Fever 10/05/2014  .  GERD (gastroesophageal reflux disease)   . Hiatal hernia   . Hyperlipidemia   . Hypertension   . 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 Macular degeneration   . On home oxygen therapy    "3L; all the time" (04/30/2018)  . Osteoarthritis   . Osteoporosis   . Pneumonia    years ago  . Pulmonary embolism ([place]) 04/30/2018  . Reactive airway disease   . Rupture of bowel ([place])   . Sepsis ([place]) 10/08/2014  . Stroke [facility])    "mini stroke"- balance and memory issue  . Stroke ([place])   . Wrist pain, acute 09/10/2012   Past Surgical History:  Procedure Laterality Date  . ABDOMINAL HYSTERECTOMY    . ABDOMINAL  SURGERY    . APPENDECTOMY    . BLADDER SURGERY    . CATARACT EXTRACTION W/ INTRAOCULAR LENS  IMPLANT, BILATERAL Bilateral   . CHOLECYSTECTOMY    . COLON SURGERY    . COLOSTOMY REVERSAL    . ESOPHAGOGASTRODUODENOSCOPY N/A 10/30/2018   Procedure: ESOPHAGOGASTRODUODENOSCOPY (EGD);  Surgeon: Lin Landsman, MD;  [facility] END[facility]COPY;  Service: Gastroenterology;  Laterality: N/A;  . INTRAMEDULLARY (IM) NAIL INTERTROCHANTERIC Right 09/13/2016   Procedure: INTRAMEDULLARY (IM) NAIL INTERTROCHANTRIC RIGHT;  Surgeon: Leandrew Koyanagi, MD;  Location: [place];  Service: Orthopedics;  Laterality: Right;  . JOINT REPLACEMENT Bilateral   . KNEE SURGERY    . OSTOMY    . RECTOCELE REPAIR    . TOE SURGERY Right    3rd  . TOTAL HIP ARTHROPLASTY Right 09/30/2017   Procedure: RIGHT HIP IM NAIL REMOVAL WITH CONVERSION TO TOTAL HIP ARTHOPLASTY, anterior approach;  Surgeon: Renette Butters, MD;  Location: [place];  Service: Orthopedics;  Laterality: Right;   Family History  Problem Relation Age of Onset  . Heart failure Mother   . Heart attack Father   . Lung cancer Sister   . Deep vein thrombosis Sister   . Pulmonary embolism Son   . Deep vein thrombosis Son   . Cervical cancer Daughter    Social History   Tobacco Use  . Smoking status: Former Smoker    Packs/day: 1.50    Years: 30.00    Pack years: 45.00    Types: E-cigarettes, Cigarettes    Quit date: 2013    Years since quitting: 7.7  . Smokeless tobacco: Never Used  Substance Use Topics  . Alcohol use: No    Alcohol/week: 0.0 standard drinks  . Drug use: No      Observations/Objective: Patient is alert and answers questions appropriately.  Unfortunately I did not get a good visual of her prior to her connection being lost.  She was able to talk to me in complete sentences although I did witness several episodes of dry, tight sounding cough.  She was crying at the end of our visit when discussing how she just wants to be able to walk again  without her walker. Temp 97.6 F (36.4 C) (Oral)   Ht 5\' 4"  (1.626 m)   Wt 178 lb (80.7 kg)   BMI 30.55 kg/m  Wt Readings from Last 3 Encounters:  02/22/19 178 lb (80.7 kg)  01/05/19 183 lb (83 kg)  12/18/18 180 lb (81.6 kg)    Assessment and Plan: 1. Lower respiratory infection -Given duration, persistence of symptoms as well as increased sputum production, will cover with antibiotic and 5 days of steroids. -Strict follow-up instructions were reviewed with patient and her daughter if she is  not better in 48 to 72 hours or if she gets worse they are to be in touch -Discussed importance of using nebulizer for breathing treatments 3-4 times a day as well as incentive spirometer - promethazine-codeine (PHENERGAN WITH CODEINE) 6.25-10 MG/5ML syrup; Take 5 mLs by mouth every 8 (eight) hours as needed for cough.  Dispense: 120 mL; Refill: 0 - doxycycline (VIBRAMYCIN) 100 MG capsule; Take 1 capsule (100 mg total) by mouth 2 (two) times daily for 5 days.  Dispense: 10 capsule; Refill: 0 - benzonatate (TESSALON) 100 MG capsule; Take 1-2 capsules (100-200 mg total) by mouth 3 (three) times daily as needed for cough.  Dispense: 40 capsule; Refill: 0 - predniSONE (DELTASONE) 20 MG tablet; Take 1 tablet (20 mg total) by mouth daily with breakfast.  Dispense: 5 tablet; Refill: 0  2. Chronic lower extremity pain ([place]) -She is currently awaiting second opinion from orthopedics and she is planning to resume physical therapy later this week.  3. Depression, recurrent ([place]) -This is been worsened lately with her inability to ambulate other than in her home with a walker. -She is not interested in counseling when offered to her. -Discussed weaning her citalopram and starting her on duloxetine and her daughter will let me know if they are interested in this.   Clarene Reamer, FNP-BC  [place] [facility] at [facility] [place] [facility]  02/22/2019 12:30 PM   Follow Up Instructions:     I discussed the assessment and treatment plan with the patient. The patient was provided an opportunity to ask questions and all were answered. The patient agreed with the plan and demonstrated an understanding of the instructions.   The patient was advised to call back or seek an in-person evaluation if the symptoms worsen or if the condition fails to improve as anticipated.   [name], FNP

## 2019-02-24 ENCOUNTER — Other Ambulatory Visit: Payer: Self-pay | Admitting: Family Medicine

## 2019-02-25 ENCOUNTER — Other Ambulatory Visit: Payer: Self-pay | Admitting: Family Medicine

## 2019-02-25 MED ORDER — DULOXETINE HCL 20 MG PO CPEP: 20.0000 mg | ORAL_CAPSULE | Freq: Every day | ORAL | 2 refills | Status: DC

## 2019-02-25 NOTE — Telephone Encounter (Signed)
Last filled on 08/11/2018 #90 with 1 refill. LOV 02/22/2019  No future appointments.

## 2019-02-25 NOTE — Progress Notes (Signed)
See my chart message for additional information.  Will wean citalopram over the next several weeks and then start duloxetine.

## 2019-02-26 ENCOUNTER — Encounter: Payer: Self-pay | Admitting: Family Medicine

## 2019-02-26 ENCOUNTER — Telehealth: Payer: Self-pay | Admitting: Family Medicine

## 2019-02-26 NOTE — Telephone Encounter (Signed)
Called and spoke with patient's daughter. New onset over last week. Does not seem to be related to increased pain or anxiety. Cough improved. ? Related to prednisone. Will monitor over the weekend and let me know.

## 2019-02-26 NOTE — Telephone Encounter (Signed)
[name] (daughter)  [phone]  [name] called stating pt is having problems sleeping for a week now.  Going to bed between 12 to 2am  but not sleepy  Taking her meds @ 9. trazodone and xantax She started her new rx cymbalta this morning.  She might get 2hours a sleep Please advise what she needs to do.

## 2019-02-26 NOTE — Telephone Encounter (Signed)
Please advise [name], thanks.

## 2019-03-02 ENCOUNTER — Encounter: Payer: Self-pay | Admitting: Family Medicine

## 2019-03-04 ENCOUNTER — Other Ambulatory Visit: Payer: Self-pay | Admitting: Family Medicine

## 2019-03-04 MED ORDER — MAGIC MOUTHWASH W/LIDOCAINE: 10.0000 mL | Freq: Four times a day (QID) | ORAL | 0 refills | Status: DC | PRN

## 2019-03-04 NOTE — Progress Notes (Signed)
Unable to send prescription for Magic Mouthwash electronically. Called in to patient's pharmacy.

## 2019-03-05 ENCOUNTER — Encounter: Payer: Self-pay | Admitting: Family Medicine

## 2019-03-05 ENCOUNTER — Ambulatory Visit: Payer: Medicare Other | Admitting: Family Medicine

## 2019-03-07 NOTE — Progress Notes (Signed)
Erroneous encounter. Patient scheduled for virtual visit and arrived but later rescheduled appointment for a different day.

## 2019-03-08 ENCOUNTER — Encounter: Payer: Self-pay | Admitting: Family Medicine

## 2019-03-08 ENCOUNTER — Ambulatory Visit (INDEPENDENT_AMBULATORY_CARE_PROVIDER_SITE_OTHER): Payer: Medicare Other | Admitting: Family Medicine

## 2019-03-08 VITALS — Temp 97.3°F | Ht 64.0 in | Wt 178.0 lb

## 2019-03-08 DIAGNOSIS — R05 Cough: Secondary | ICD-10-CM | POA: Diagnosis not present

## 2019-03-08 DIAGNOSIS — R112 Nausea with vomiting, unspecified: Secondary | ICD-10-CM | POA: Diagnosis not present

## 2019-03-08 DIAGNOSIS — R1314 Dysphagia, pharyngoesophageal phase: Secondary | ICD-10-CM | POA: Diagnosis not present

## 2019-03-08 DIAGNOSIS — R0982 Postnasal drip: Secondary | ICD-10-CM

## 2019-03-08 DIAGNOSIS — R059 Cough, unspecified: Secondary | ICD-10-CM

## 2019-03-08 MED ORDER — FLUTICASONE PROPIONATE 50 MCG/ACT NA SUSP: 2.0000 | Freq: Every day | NASAL | 6 refills | Status: DC

## 2019-03-08 NOTE — Progress Notes (Signed)
Virtual Visit via Video Note  I connected with [name] on 03/08/19 at  2:30 PM EDT by a video enabled telemedicine application and verified that I am speaking with the correct person using two identifiers.  We attempted to connect via video and although the patient and her daughter were able to see and hear me, I was unable to see and hear them.  We completed the visit via telephone only.  Location: Patient: In her home, accompanied by her daughter [name]. Provider: [place]   I discussed the limitations of evaluation and management by telemedicine and the availability of in person appointments. The patient expressed understanding and agreed to proceed.  History of Present Illness: Chief Complaint  Patient presents with  . Cough    cough x 2 weeks, worsening since last OV. C/o PND, some green mucous production. Denies SOB, chest tx or px. Pt states that mucous is thick and she wakes up choking d/t the drainage. No fever. Mucinex did not help. Pt states that she is more lethargic, no energy and sleeps alot. Not drinking    This is a 78 year old female with multiple chronic medical problems.  She was seen for virtual visit 02/22/2019 for suspected COPD exacerbation.  She was given antibiotics, prednisone and cough medication.  She has had some improvement in cough but continues to have some green sputum production, occasional wheeze and DOE.  She is not consistent in using her nebulizer.  She reports that a lot of her cough is stemming from thick postnasal drainage.  She is not taking anything for nasal congestion/allergies.  According to the patient and her daughter, she has been lying around more.  She has not been feeling very thirsty and has not been drinking more than 16 ounces of liquid in a day. She is intermittently been complaining of vomiting after eating.  This occurs no matter what she eats.  She reports that she occasionally feels her food is stuck.  She denies  odynophagia.  She had a negative EGD June 2020 while in the hospital.  She is on chronic esomeprazole 40 mg daily.   Past Medical History:  Diagnosis Date  . Acute postoperative pain 02/03/2018  . Anemia   . B12 deficiency   . Bacteremia 10/07/2014  . Bladder incontinence   . Blind left eye   . Cancer ([place])    skin cancer   . Cataract   . Cervical spine fracture ([place])   . Chest pain 07/29/2013  . Compression fracture    C7,  upper T spine -compression fx  . COPD (chronic obstructive pulmonary disease) ([place])   . DDD (degenerative disc disease), lumbar   . Depression    major  . Diffuse myofascial pain syndrome 03/24/2015  . Dyspnea    at times- when activty  . Fever 10/05/2014  . GERD (gastroesophageal reflux disease)   . Hiatal hernia   . Hyperlipidemia   . Hypertension   . 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 Macular degeneration   . On home oxygen therapy    "3L; all the time" (04/30/2018)  . Osteoarthritis   . Osteoporosis   . Pneumonia    years ago  . Pulmonary embolism ([place]) 04/30/2018  . Reactive airway disease   . Rupture of bowel ([place])   . Sepsis ([place]) 10/08/2014  . Stroke [facility])    "mini stroke"- balance and memory issue  . Stroke ([place])   . Wrist pain, acute 09/10/2012  Past Surgical History:  Procedure Laterality Date  . ABDOMINAL HYSTERECTOMY    . ABDOMINAL SURGERY    . APPENDECTOMY    . BLADDER SURGERY    . CATARACT EXTRACTION W/ INTRAOCULAR LENS  IMPLANT, BILATERAL Bilateral   . CHOLECYSTECTOMY    . COLON SURGERY    . COLOSTOMY REVERSAL    . ESOPHAGOGASTRODUODENOSCOPY N/A 10/30/2018   Procedure: ESOPHAGOGASTRODUODENOSCOPY (EGD);  Surgeon: Lin Landsman, MD;  [facility] ENDOSCOPY;  Service: Gastroenterology;  Laterality: N/A;  . INTRAMEDULLARY (IM) NAIL INTERTROCHANTERIC Right 09/13/2016   Procedure: INTRAMEDULLARY (IM) NAIL INTERTROCHANTRIC RIGHT;  Surgeon: Leandrew Koyanagi, MD;  Location: [place];  Service: Orthopedics;  Laterality:  Right;  . JOINT REPLACEMENT Bilateral   . KNEE SURGERY    . OSTOMY    . RECTOCELE REPAIR    . TOE SURGERY Right    3rd  . TOTAL HIP ARTHROPLASTY Right 09/30/2017   Procedure: RIGHT HIP IM NAIL REMOVAL WITH CONVERSION TO TOTAL HIP ARTHOPLASTY, anterior approach;  Surgeon: Renette Butters, MD;  Location: [place];  Service: Orthopedics;  Laterality: Right;   Family History  Problem Relation Age of Onset  . Heart failure Mother   . Heart attack Father   . Lung cancer Sister   . Deep vein thrombosis Sister   . Pulmonary embolism Son   . Deep vein thrombosis Son   . Cervical cancer Daughter    Social History   Tobacco Use  . Smoking status: Former Smoker    Packs/day: 1.50    Years: 30.00    Pack years: 45.00    Types: E-cigarettes, Cigarettes    Quit date: 2013    Years since quitting: 7.7  . Smokeless tobacco: Never Used  Substance Use Topics  . Alcohol use: No    Alcohol/week: 0.0 standard drinks  . Drug use: No    Observations/Objective: The patient is alert and answers questions appropriately.  She is fully engaged in the conversation.  She is able to speak in complete sentences.  No audible wheeze or witnessed cough. Temp (!) 97.3 F (36.3 C) (Oral)   Ht 5\' 4"  (1.626 m)   Wt 178 lb (80.7 kg)   BMI 30.55 kg/m  Wt Readings from Last 3 Encounters:  03/08/19 178 lb (80.7 kg)  03/05/19 178 lb (80.7 kg)  02/22/19 178 lb (80.7 kg)    Assessment and Plan: 1. Post-nasal drainage -We will have her start fluticasone twice a day for 3 days then nightly.  Encouraged regular saline nasal spray use to help thin secretions as well as needs to significantly increase fluid intake. - fluticasone (FLONASE) 50 MCG/ACT nasal spray; Place 2 sprays into both nostrils daily.  Dispense: 16 g; Refill: 6  2. Cough -Improved following antibiotic and prednisone for COPD exacerbation.  History sounds more like cough is exacerbated by postnasal drainage at this point.  Discussed ways to  improve this to hopefully lessen cough. -Follow-up in 3 to 5 days if no improvement, sooner if worsening  3. Non-intractable vomiting with nausea, unspecified vomiting type - Ambulatory referral to Gastroenterology  4. Pharyngoesophageal dysphagia -Recent normal EGD, I do not think additional imaging would be beneficial at this point.  We will have her see a GI. - Ambulatory referral to Gastroenterology   Clarene Reamer, FNP-BC   Primary Care at [facility], [place]  03/08/2019 3:04 PM   Follow Up Instructions: Visit recap sent to patient via my chart   I discussed  the assessment and treatment plan with the patient. The patient was provided an opportunity to ask questions and all were answered. The patient agreed with the plan and demonstrated an understanding of the instructions.   The patient was advised to call back or seek an in-person evaluation if the symptoms worsen or if the condition fails to improve as anticipated.   [name], FNP

## 2019-03-09 ENCOUNTER — Encounter: Payer: Self-pay | Admitting: *Deleted

## 2019-03-11 ENCOUNTER — Encounter: Payer: Self-pay | Admitting: Family Medicine

## 2019-03-19 ENCOUNTER — Other Ambulatory Visit: Payer: Self-pay | Admitting: Family Medicine

## 2019-03-22 ENCOUNTER — Ambulatory Visit: Payer: Self-pay

## 2019-03-22 NOTE — Telephone Encounter (Signed)
  Call received from office to triage patient. Spoke with patients [name] who states her mother has been sick for a couple weeks.  She has had visit 03/08/2019 .  She is calling today because her mother has been getting worse.  Today she is SOB with activity  She states her chest is tight. Worse symptom today is headache,and joint pain. She hs had nausea and vomiting Low grade fever 99.3. She is unsure if her mother has been exposed to COVID-19.  She states that her brother the patients son has had a positive exposure through his work and is waiting result. [name] has stage 3 kidney disease, COPD, Hx of PE. Daughter states that her mom is complaining that her kidneys are "not right". Per protocol daughter will transport the patient to ER. Care advice read to daughter.  [facility] Triage nurse Kathlee Nations was notified and report was given.  Will notify office. Reason for Disposition . SEVERE or constant chest pain or pressure (Exception: mild central chest pain, present only when coughing)  Answer Assessment - Initial Assessment Questions 1. COVID-19 DIAGNOSIS: "Who made your Coronavirus (COVID-19) diagnosis?" "Was it confirmed by a positive lab test?" If not diagnosed by a HCP, ask "Are there lots of cases (community spread) where you live?" (See public health department website, if unsure)     [name] but son has been exposed 2. ONSET: "When did the COVID-19 symptoms start?"      2 weeks 3. WORST SYMPTOM: "What is your worst symptom?" (e.g., cough, fever, shortness of breath, muscle aches)     headache 4. COUGH: "Do you have a cough?" If so, ask: "How bad is the cough?"       Hoarse and coughing 5. FEVER: "Do you have a fever?" If so, ask: "What is your temperature, how was it measured, and when did it start?"     Unsure  99.3 low grade 6. RESPIRATORY STATUS: "Describe your breathing?" (e.g., shortness of breath, wheezing, unable to speak)     SOB tight with activity 7. BETTER-SAME-WORSE: "Are  you getting better, staying the same or getting worse compared to yesterday?"  If getting worse, ask, "In what way?"    worse 8. HIGH RISK DISEASE: "Do you have any chronic medical problems?" (e.g., asthma, heart or lung disease, weak immune system, etc.)     PE COPD Pneumania 9. PREGNANCY: "Is there any chance you are pregnant?" "When was your last menstrual period?"    N/A 10. OTHER SYMPTOMS: "Do you have any other symptoms?"  (e.g., chills, fatigue, headache, loss of smell or taste, muscle pain, sore throat)      Headache tired, joint pain pain  Protocols used: CORONAVIRUS (COVID-19) DIAGNOSED OR SUSPECTED-A-AH

## 2019-03-22 NOTE — Telephone Encounter (Signed)
Do not see where patient went to ER as advised. Please call and check on her.

## 2019-03-22 NOTE — Telephone Encounter (Signed)
Per [name] adding more information, [name] Brady-patient's daughter, called. Patient did not want to go to ER. [name] spoke with [name] and per [name] advised patient to go to ER- [name] is aware of recommendation. Date/time: 9:25 am 03/22/2019

## 2019-03-23 ENCOUNTER — Other Ambulatory Visit: Payer: Self-pay

## 2019-03-23 NOTE — Telephone Encounter (Signed)
Spoke with patient's daughter, [name], patient did not go to ER. Patient seems to be a little better today. Patient has not complained of SOB or tightness in the chest. NO fever. [name] said she kept trying to get patient to go to ER but patient said no and she would be ok.

## 2019-03-23 NOTE — Telephone Encounter (Signed)
Per patient's daughter, [name], patient needs refill on Nexium. She takes this once daily. In the med list this medication is under provider historical. Please review

## 2019-03-24 ENCOUNTER — Telehealth: Payer: Self-pay

## 2019-03-24 MED ORDER — ESOMEPRAZOLE MAGNESIUM 40 MG PO CPDR: 40.0000 mg | DELAYED_RELEASE_CAPSULE | Freq: Every day | ORAL | 1 refills | Status: DC

## 2019-03-24 NOTE — Telephone Encounter (Signed)
Noted  

## 2019-03-24 NOTE — Telephone Encounter (Signed)
I spoke with patient as well as [name]. I explained to her reasons why that she could not be seen in office. That given she had sinusitis, HA & ear pain if we made concessions to just see her we would have to every patient. It was a [facility] wide rule that she could not come into the office with out being tested. I urged patient due to her medical hx to go to a [facility] to be evaluated so they could see her medical records. Patient stated that she could not go to UC in [place] or [place] bc of transportation issues.  Patient as well as [name] was agreeable to go to drive thru testing at [facility] to have COVID test completed.

## 2019-03-28 ENCOUNTER — Other Ambulatory Visit: Payer: Self-pay | Admitting: Family Medicine

## 2019-03-30 IMAGING — CT CT CERVICAL SPINE W/O CM
4 series · 15 of 33 positions shown, 18 images · non-contrast
Comparison: 09/13/2016

CLINICAL DATA: Fall at home [place] being on right side. Head injury.
C-spine trauma with high clinical risk.

EXAM:
CT HEAD WITHOUT CONTRAST
CT CERVICAL SPINE WITHOUT CONTRAST
TECHNIQUE: Multidetector CT imaging of the head and cervical spine was
performed following the standard protocol without intravenous
contrast. Multiplanar CT image reconstructions of the cervical spine
were also generated.

[Series 4: c_spine 2.0 st · axial · 0.26mm/px · z∈[-295,-177]mm · 5 of 89 slices shown, 7 images]
[im 15/89  soft-tissue]
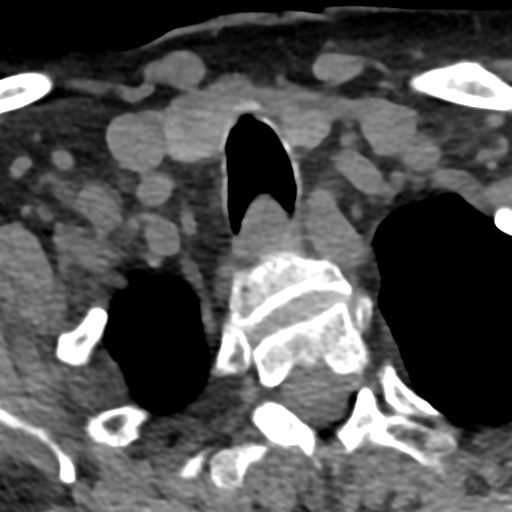
[im 15/89  bone]
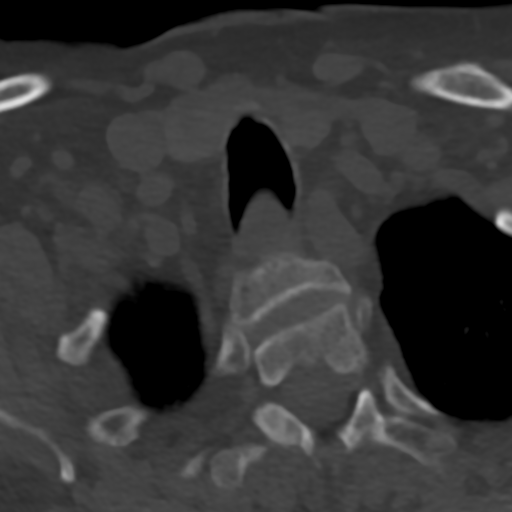
[im 30/89  bone]
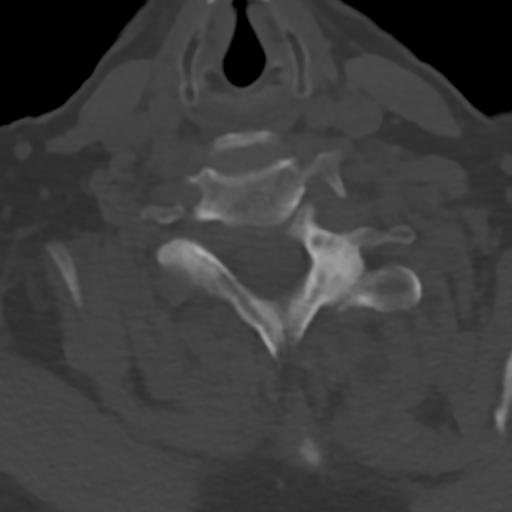
[im 45/89  bone]
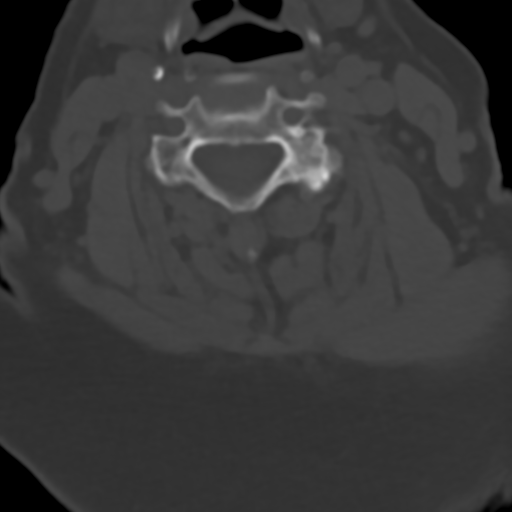
[im 59/89  bone]
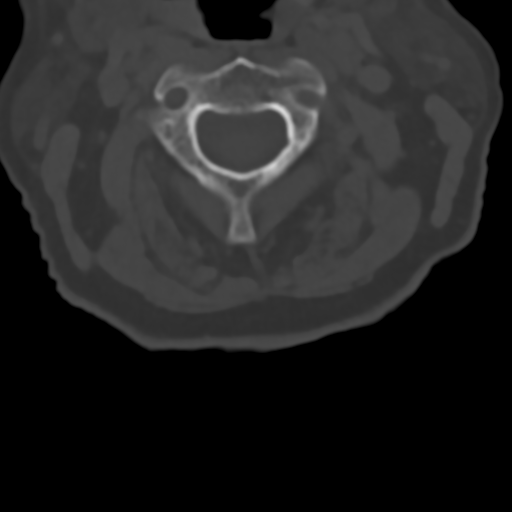
[im 74/89  soft-tissue]
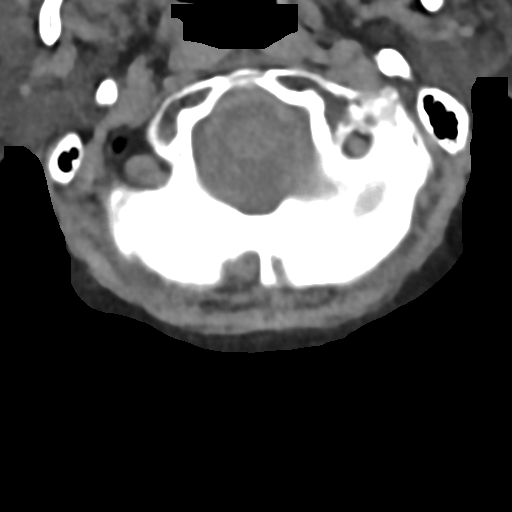
[im 74/89  bone]
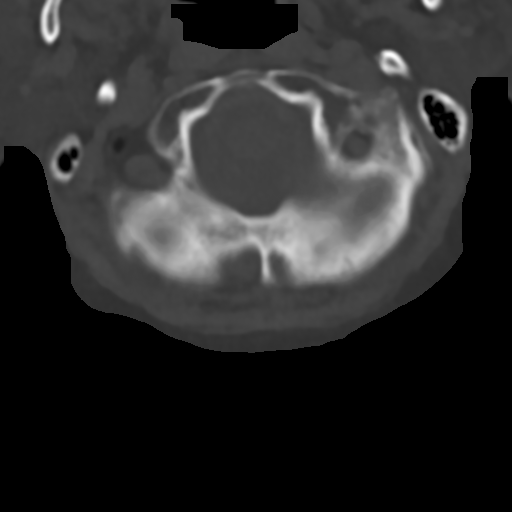

[Series 6: c_spine 2.0 sag bone · sagittal · 0.38mm/px · 5 of 61 slices shown, 6 images]
[im 21/61  bone]
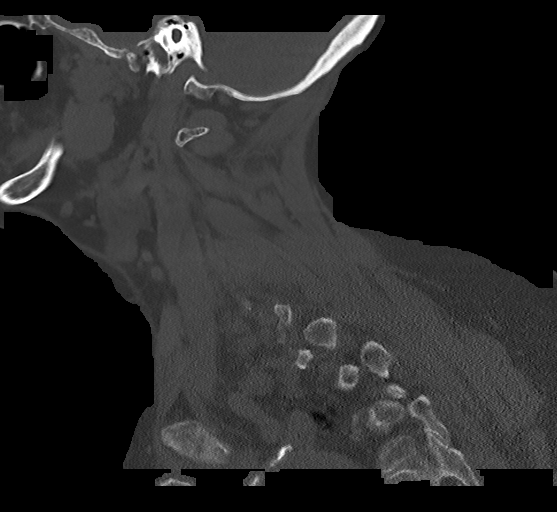
[im 26/61  bone]
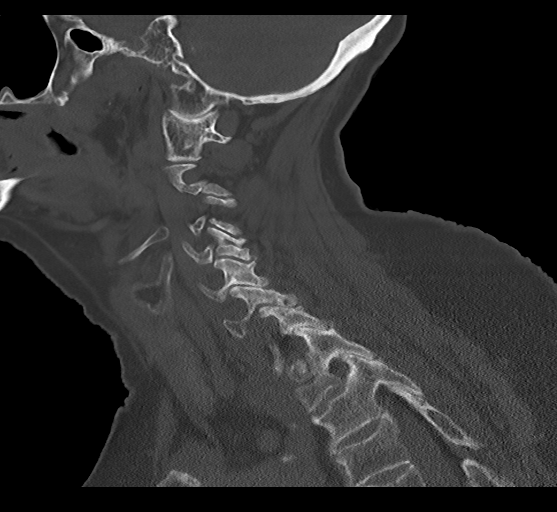
[im 31/61  soft-tissue]
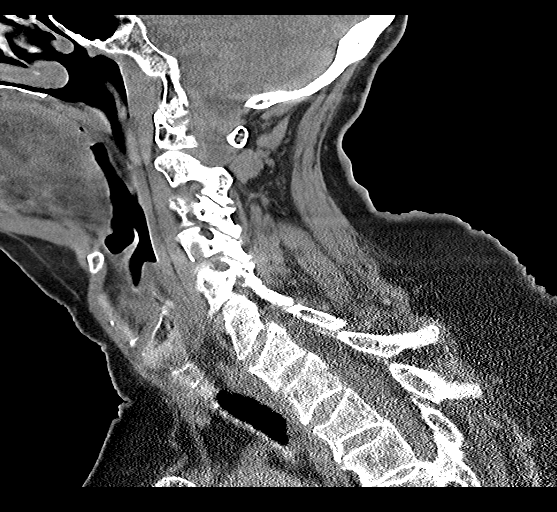
[im 31/61  bone]
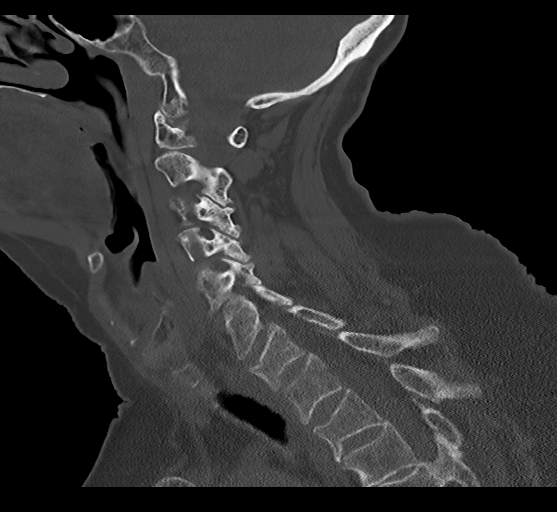
[im 36/61  bone]
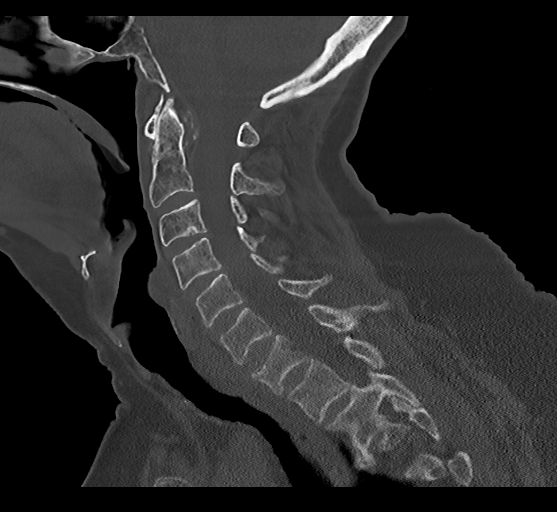
[im 41/61  bone]
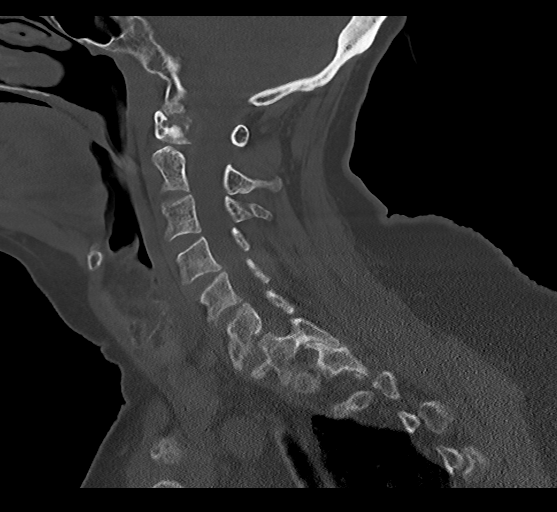

[Series 7: c_spine 2.0 cor bone · coronal · 0.28mm/px · 3 of 61 slices shown]
[im 13/61  bone]
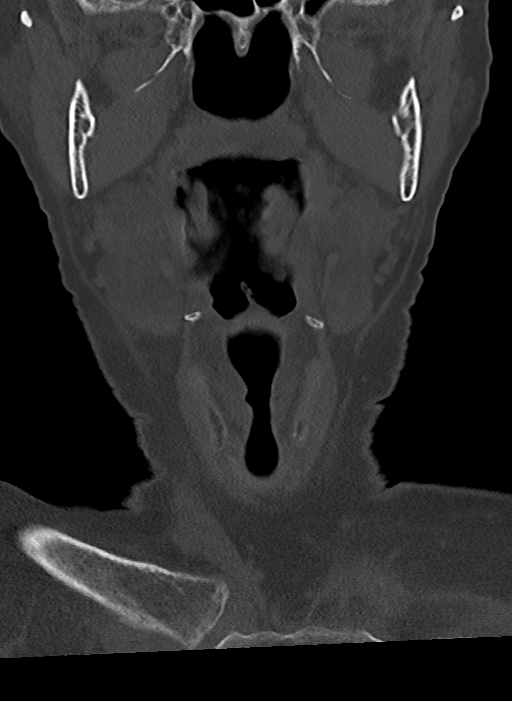
[im 25/61  bone]
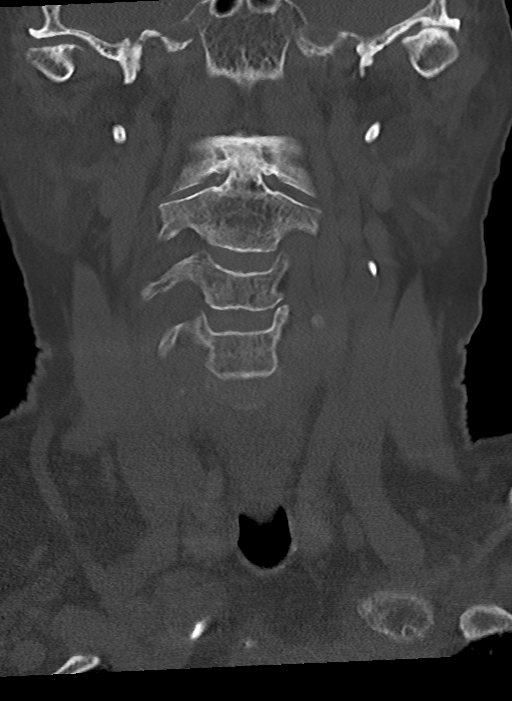
[im 37/61  bone]
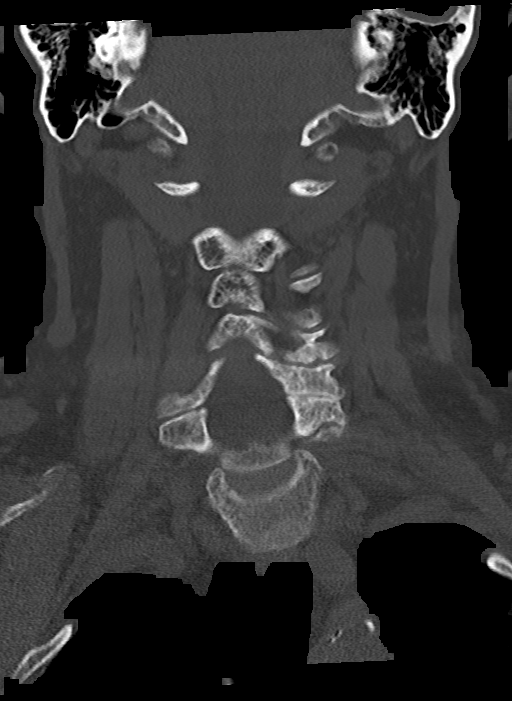

[Series 8: c_spine 2.0 orthogonals · axial · 0.21mm/px · z∈[-303,-280]mm · 2 of 78 slices shown]
[im 13/78  bone]
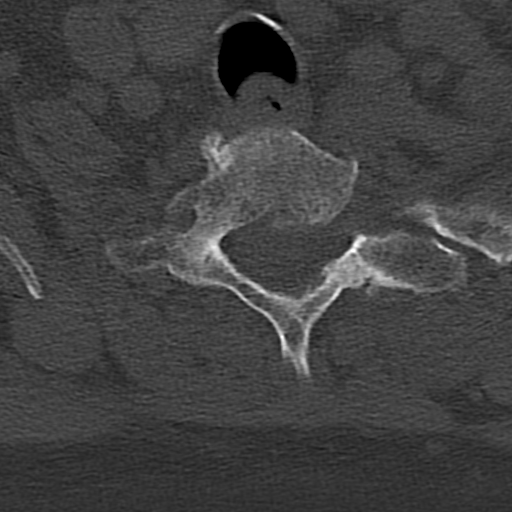
[im 26/78  bone]
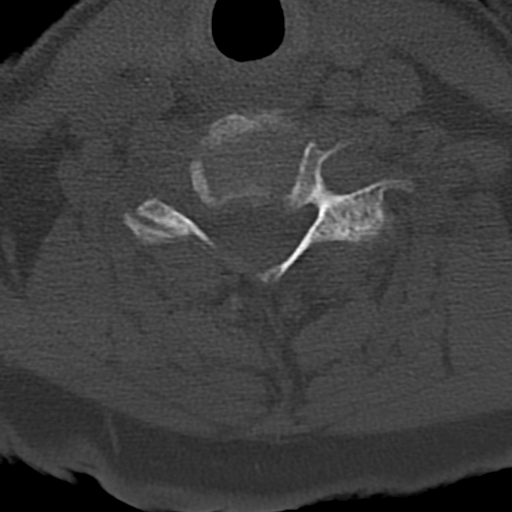

[15 of 33 positions shown; findings below may reference images not displayed]

FINDINGS: CT HEAD FINDINGS

Brain: No evidence of acute infarction, hemorrhage, hydrocephalus,
extra-axial collection or mass lesion/mass effect. Mild chronic
small vessel ischemia in the periventricular white matter. Mild for
age cerebral volume loss.

Vascular: Atherosclerotic calcification.  No hyperdense vessel.

Skull: Negative for fracture

Sinuses/Orbits: No evidence of injury. Right cataract resection and
bilateral staphyloma.

CT CERVICAL SPINE FINDINGS

Alignment: No traumatic malalignment.

Skull base and vertebrae: Remote compression fractures of C7 and T2.
No acute fracture.

Soft tissues and spinal canal: No prevertebral fluid or swelling. No
visible canal hematoma.

Disc levels:  Usual degenerative changes.

Upper chest: No evidence of injury.
IMPRESSION: No evidence of acute intracranial or cervical spine injury.

## 2019-03-30 IMAGING — CT CT HEAD W/O CM
4 series · 15 of 47 positions shown, 17 images · non-contrast
Comparison: 09/13/2016

CLINICAL DATA: Fall at home Youssef being on right side. Head injury.
C-spine trauma with high clinical risk.

EXAM:
CT HEAD WITHOUT CONTRAST
CT CERVICAL SPINE WITHOUT CONTRAST
TECHNIQUE: Multidetector CT imaging of the head and cervical spine was
performed following the standard protocol without intravenous
contrast. Multiplanar CT image reconstructions of the cervical spine
were also generated.

[Series 3: head without · axial · non-contrast · 0.43mm/px · z∈[-143,-33]mm · 7 of 30 slices shown, 9 images]
[im 4/30  brain]
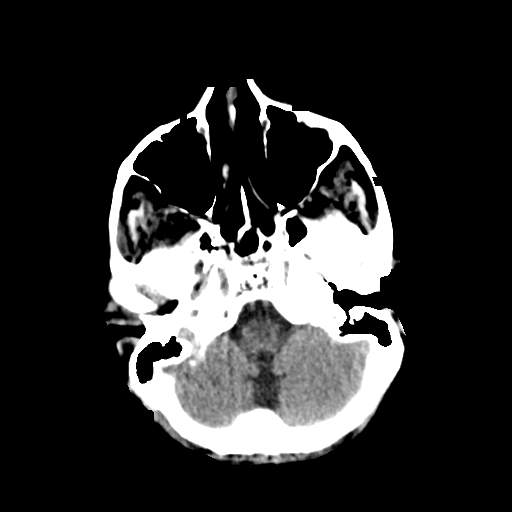
[im 4/30  bone]
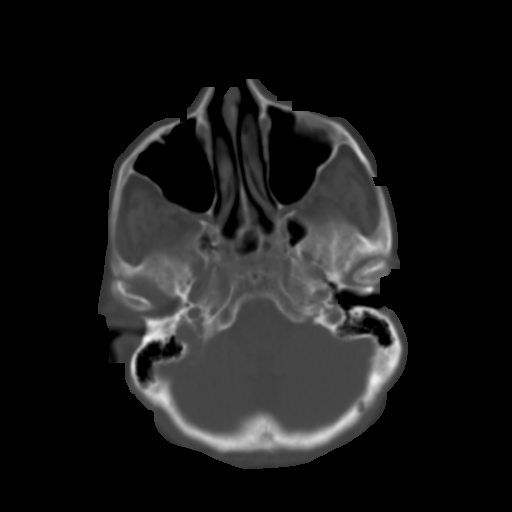
[im 8/30  brain]
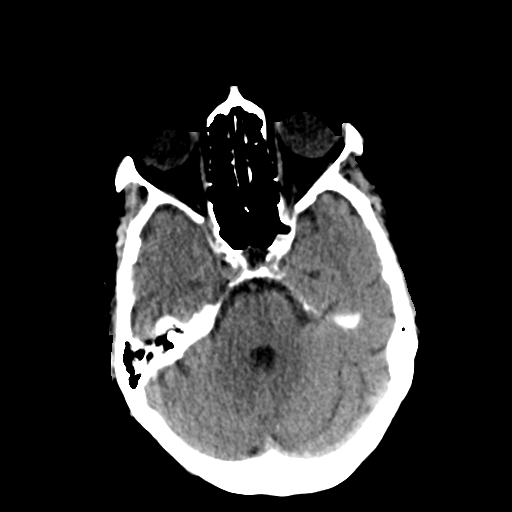
[im 11/30  brain]
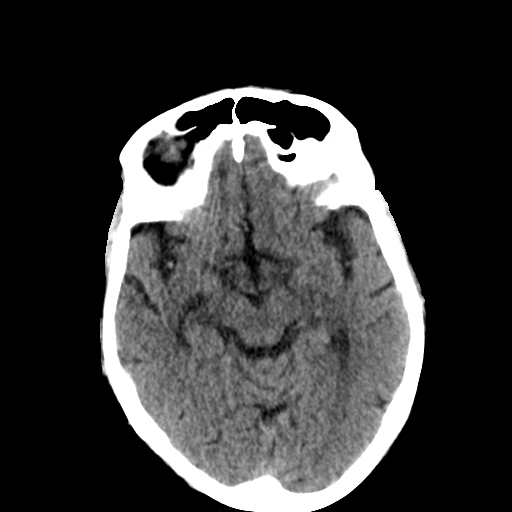
[im 15/30  brain]
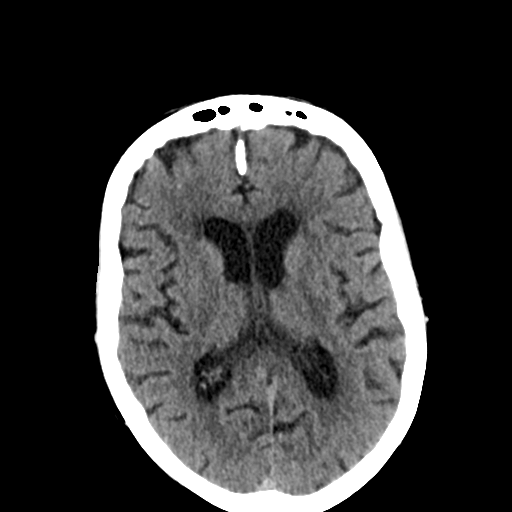
[im 19/30  brain]
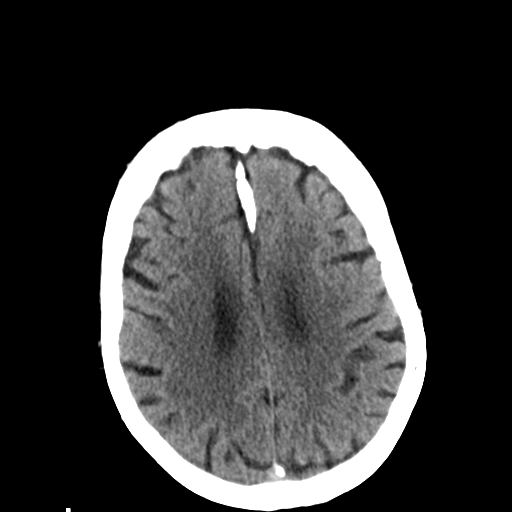
[im 19/30  bone]
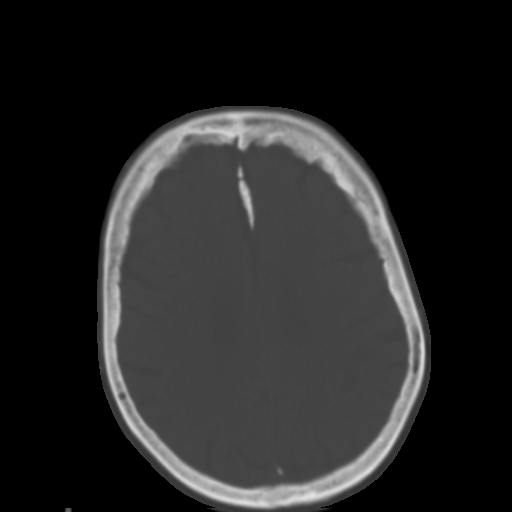
[im 22/30  brain]
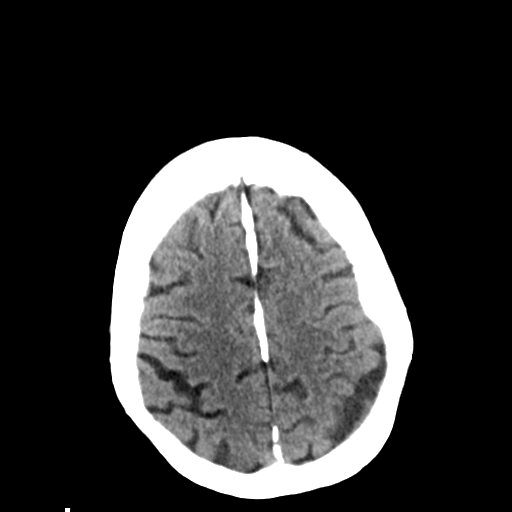
[im 26/30  brain]
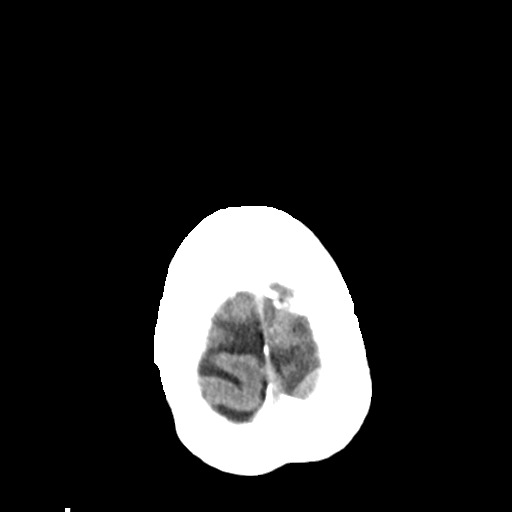

[Series 4: head bone · axial · 0.43mm/px · z∈[-144,-130]mm · 2 of 75 slices shown]
[im 8/75  bone]
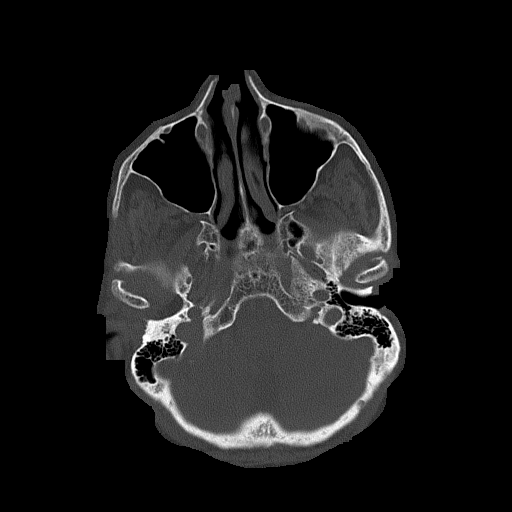
[im 15/75  bone]
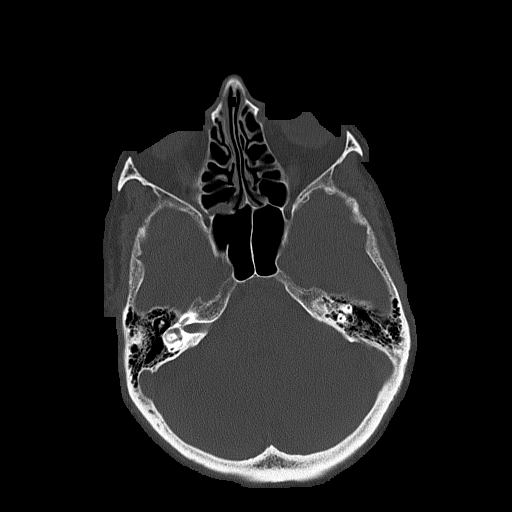

[Series 5: head without cor · coronal · non-contrast · 0.29mm/px · 3 of 67 slices shown]
[im 23/67  brain]
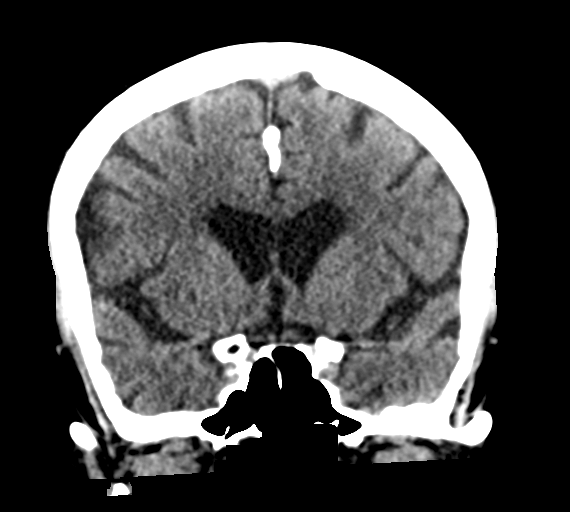
[im 30/67  brain]
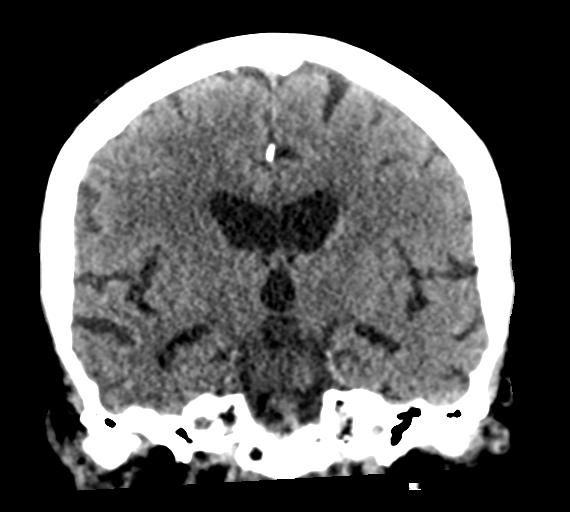
[im 37/67  brain]
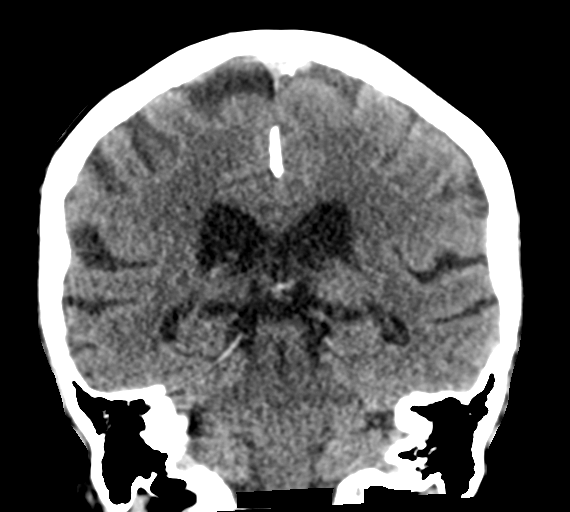

[Series 6: head without sag · sagittal · non-contrast · 0.31mm/px · 3 of 66 slices shown]
[im 22/66  brain]
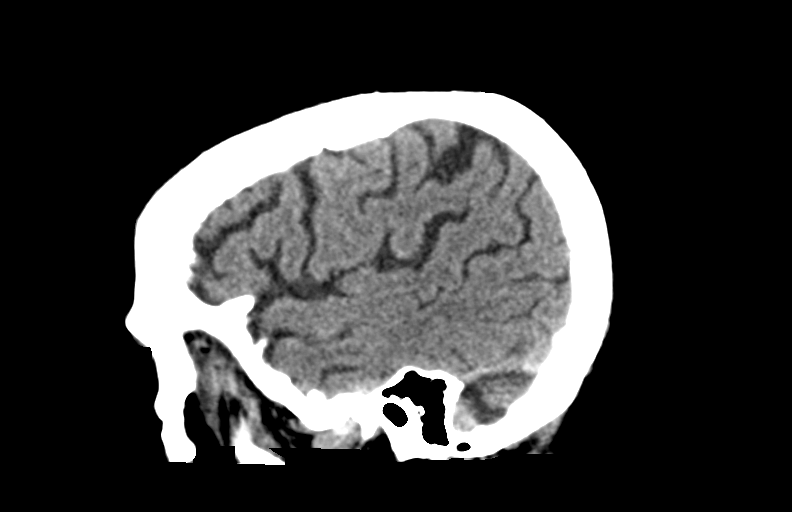
[im 33/66  brain]
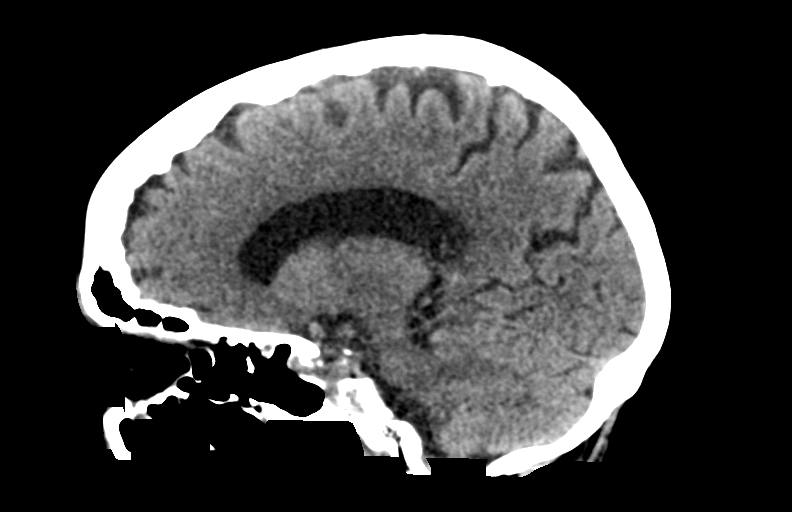
[im 44/66  brain]
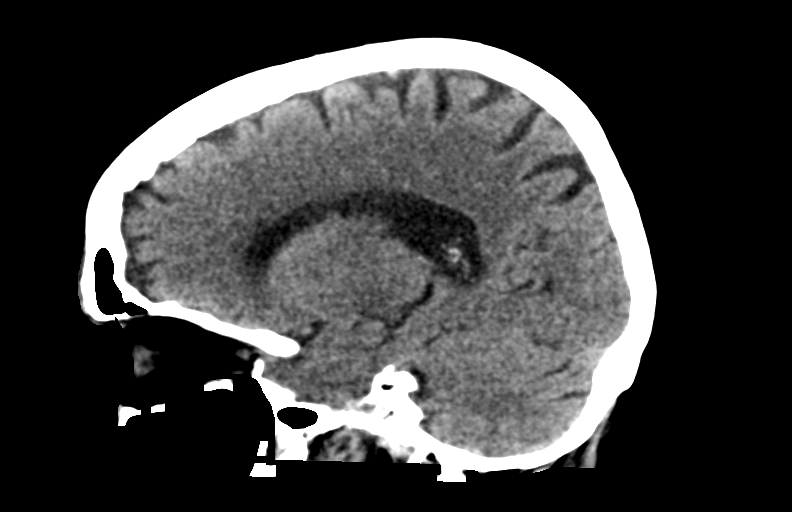

[15 of 47 positions shown; findings below may reference images not displayed]

FINDINGS: CT HEAD FINDINGS

Brain: No evidence of acute infarction, hemorrhage, hydrocephalus,
extra-axial collection or mass lesion/mass effect. Mild chronic
small vessel ischemia in the periventricular white matter. Mild for
age cerebral volume loss.

Vascular: Atherosclerotic calcification.  No hyperdense vessel.

Skull: Negative for fracture

Sinuses/Orbits: No evidence of injury. Right cataract resection and
bilateral staphyloma.

CT CERVICAL SPINE FINDINGS

Alignment: No traumatic malalignment.

Skull base and vertebrae: Remote compression fractures of C7 and T2.
No acute fracture.

Soft tissues and spinal canal: No prevertebral fluid or swelling. No
visible canal hematoma.

Disc levels:  Usual degenerative changes.

Upper chest: No evidence of injury.
IMPRESSION: No evidence of acute intracranial or cervical spine injury.

## 2019-03-30 IMAGING — DX DG HIP (WITH OR WITHOUT PELVIS) 2-3V*R*
3 series · 3 of 3 positions shown · non-contrast
Comparison: Plain films right hip 11/21/2016.

CLINICAL DATA: Right hip pain since a fall today. Initial
encounter.

EXAM:
DG HIP (WITH OR WITHOUT PELVIS) 2-3V RIGHT

[pelvis ap]
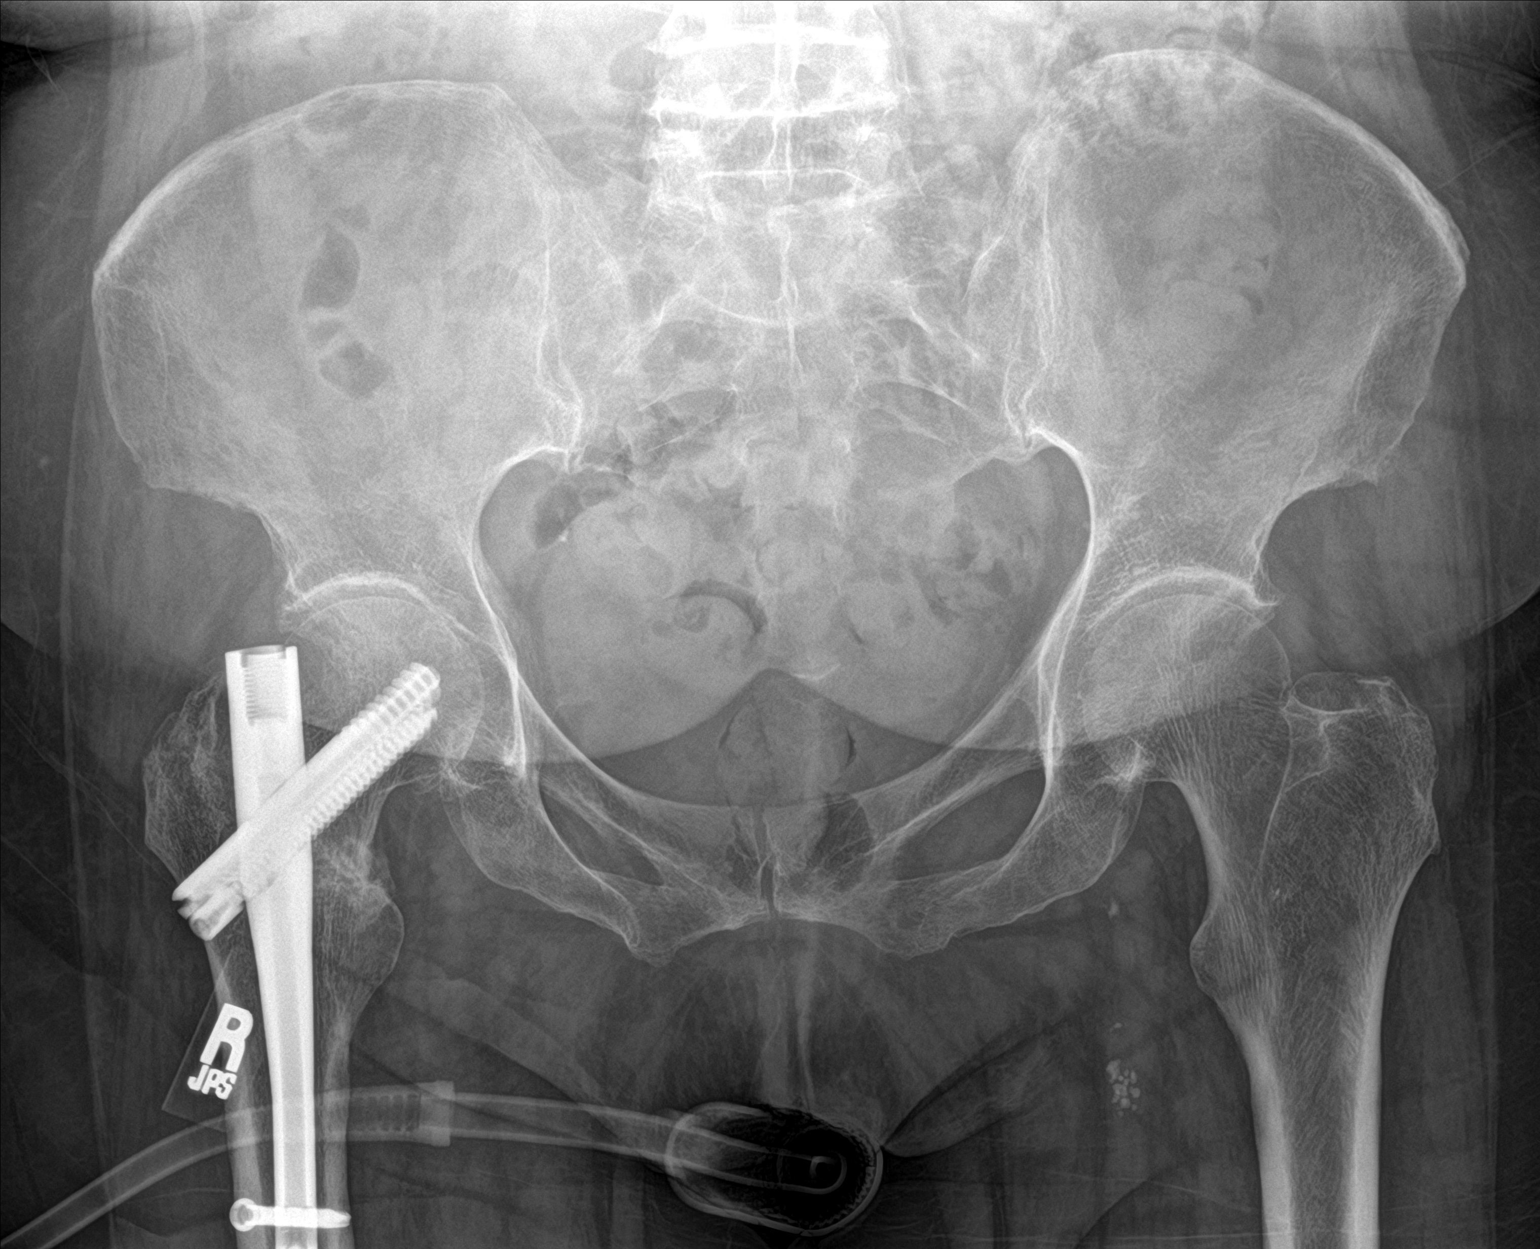

[hip ap]
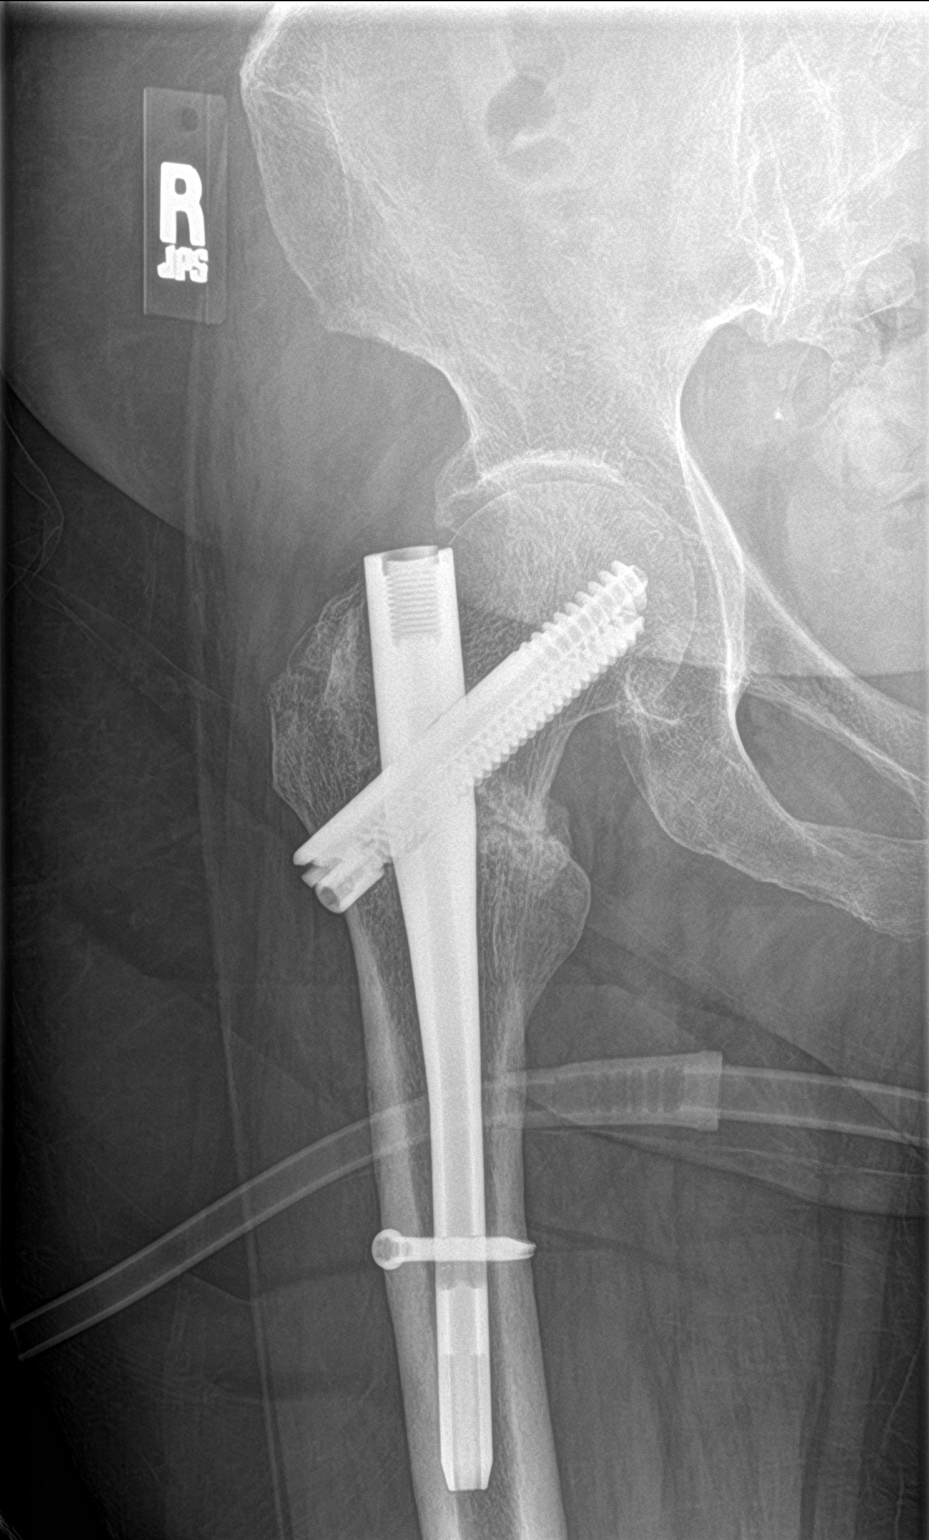

[hip x-table]
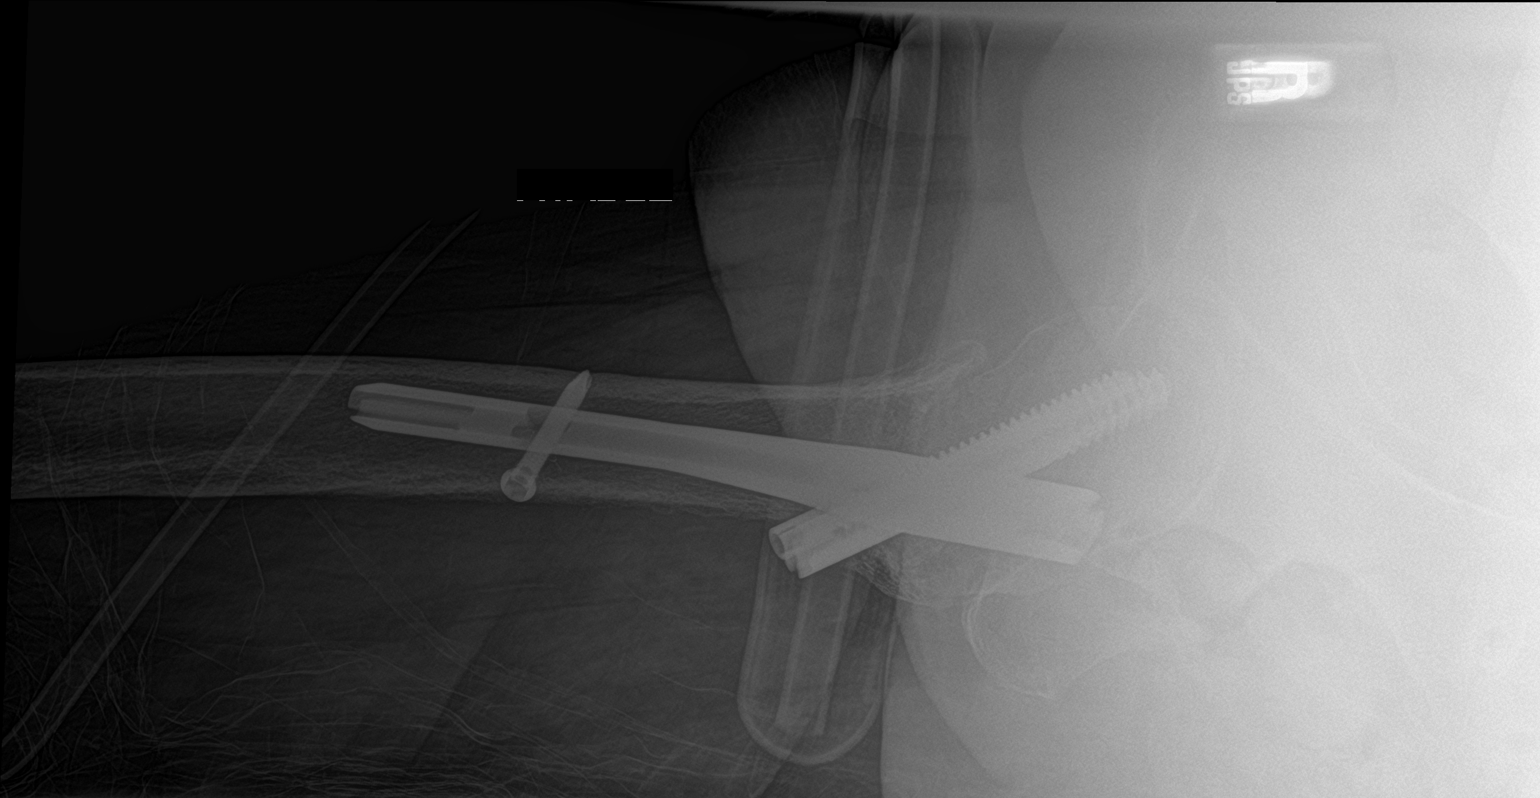

[3 of 3 positions shown; findings below may reference images not displayed]

FINDINGS: No acute bony or joint abnormality is identified. Healed right
intertrochanteric fracture with fixation hardware in place is
unchanged in appearance. Mild degenerative disease is present about
the hips.
IMPRESSION: No acute abnormality.

## 2019-03-30 IMAGING — DX DG LUMBAR SPINE 2-3V
2 series · 2 of 2 positions shown · non-contrast
Comparison: CT 10/23/2016

CLINICAL DATA: 76-year-old female with a history of fall

EXAM:
LUMBAR SPINE - 2-3 VIEW

[l-spine ap]
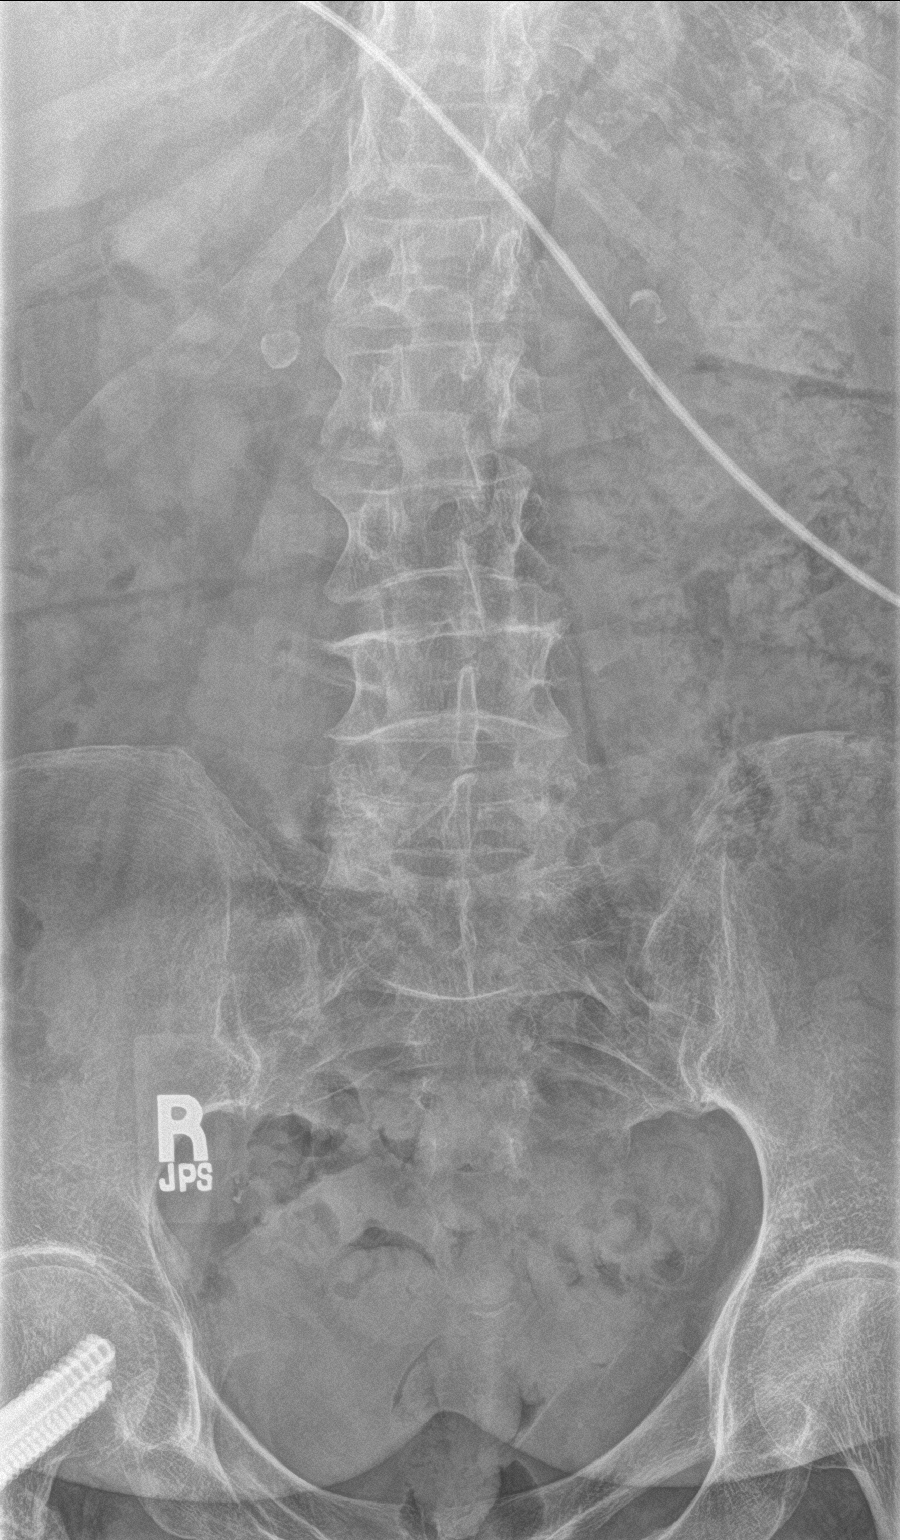

[l-spine lat]
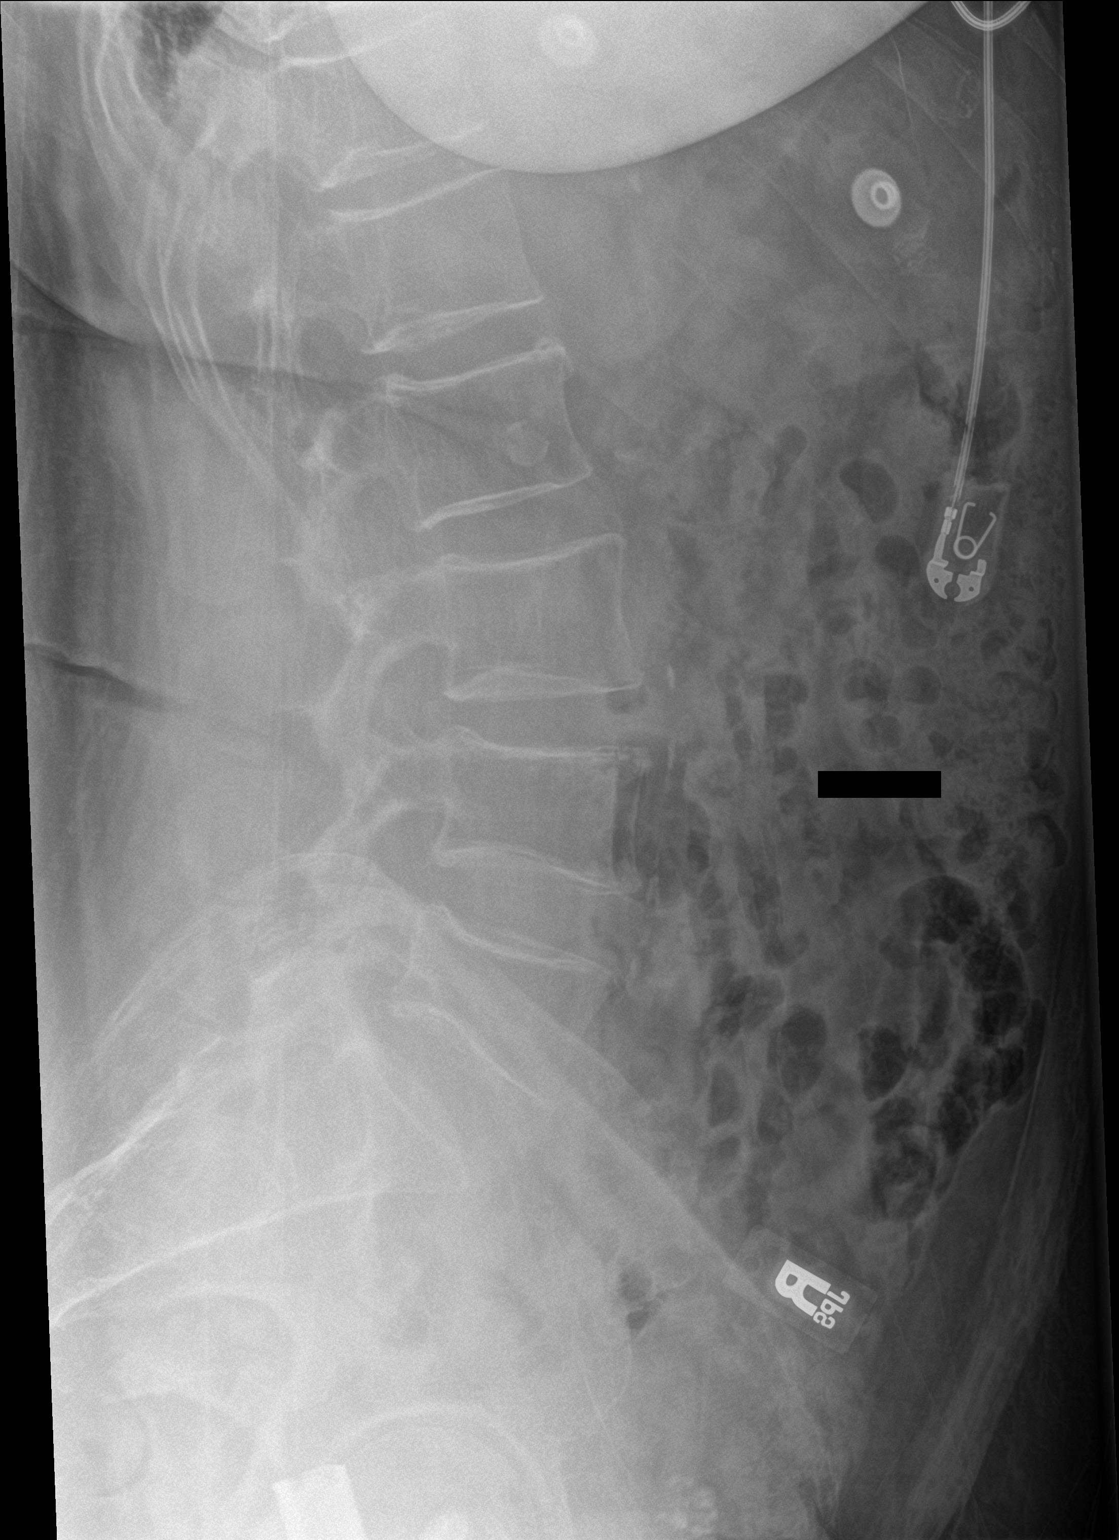

[2 of 2 positions shown; findings below may reference images not displayed]

FINDINGS: Lumbar Spine:

Lumbar vertebral elements maintain normal alignment without evidence
of anterolisthesis, retrolisthesis, subluxation.

No fracture line identified. Vertebral body heights maintained.

No significant disc space narrowing. Mild endplate changes
throughout the lumbar spine.

Osteopenia.

Vascular calcifications
IMPRESSION: No radiographic evidence of acute fracture or malalignment of the
lumbar spine.

Osteopenia.

Mild degenerative changes.

## 2019-04-01 IMAGING — CT CT HIP*R* W/O CM
2 of 3 series · 17 of 46 positions shown, 19 images · non-contrast
Comparison: None.

CLINICAL DATA: Status fall 2 days ago.  Right hip pain.

EXAM:
CT OF THE RIGHT HIP WITHOUT CONTRAST
TECHNIQUE: Multidetector CT imaging of the right hip was performed according to
the standard protocol. Multiplanar CT image reconstructions were
also generated.

[Series 3: axial (person_name) (person_name) · axial · 0.53mm/px · z∈[-939,-697]mm · 14 of 139 slices shown, 16 images]
[im 9/139  soft-tissue]
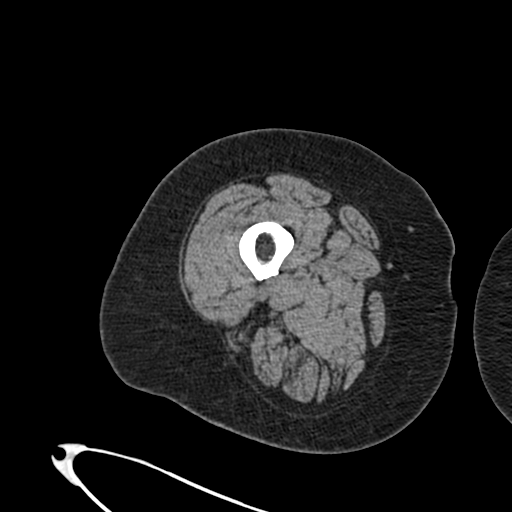
[im 9/139  bone]
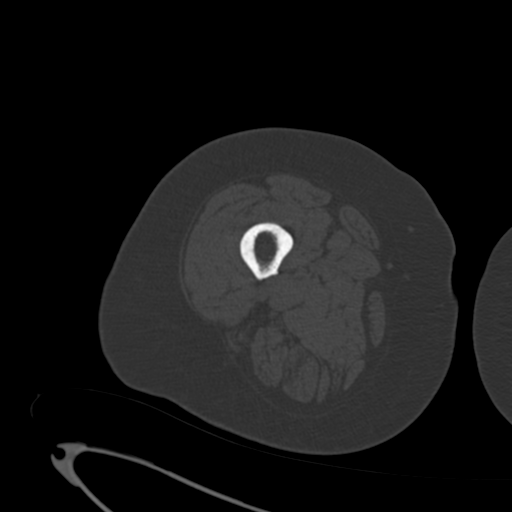
[im 18/139  soft-tissue]
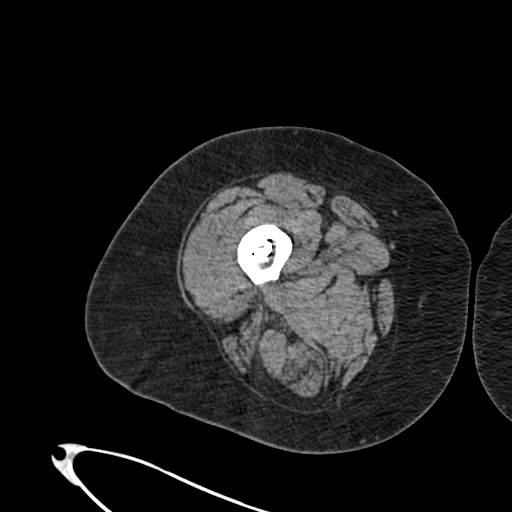
[im 27/139  soft-tissue]
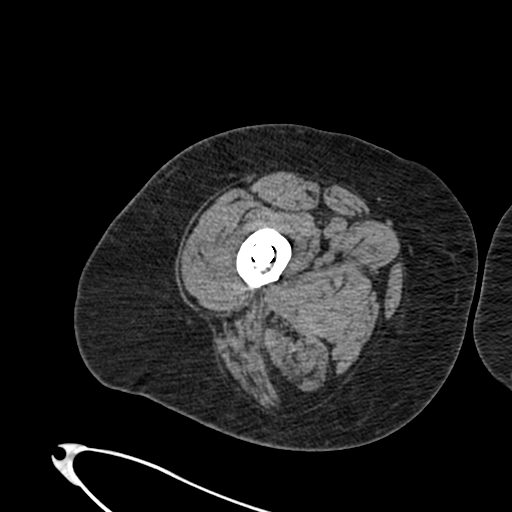
[im 36/139  soft-tissue]
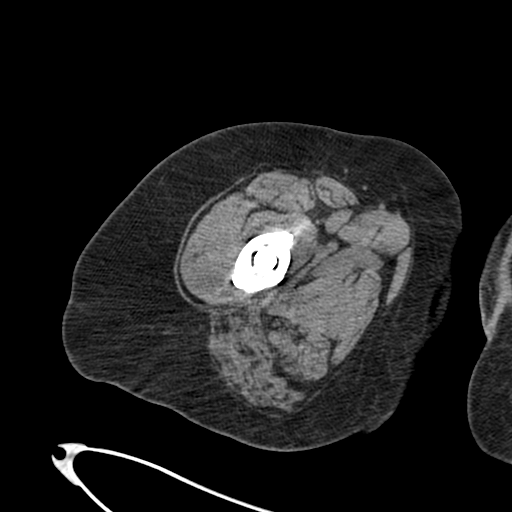
[im 45/139  soft-tissue]
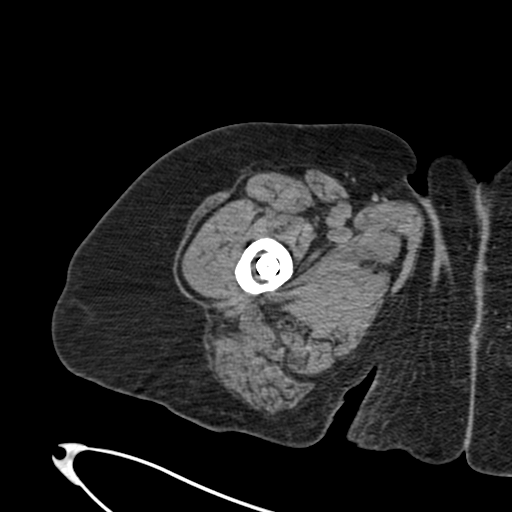
[im 54/139  soft-tissue]
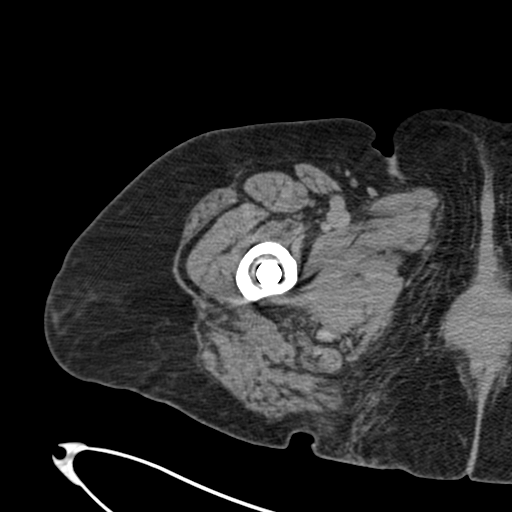
[im 63/139  soft-tissue]
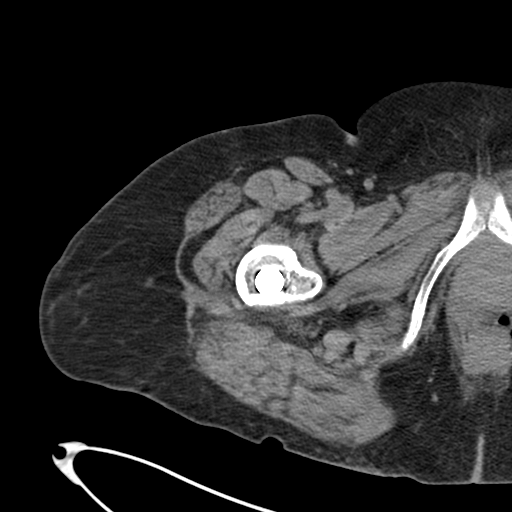
[im 76/139  soft-tissue]
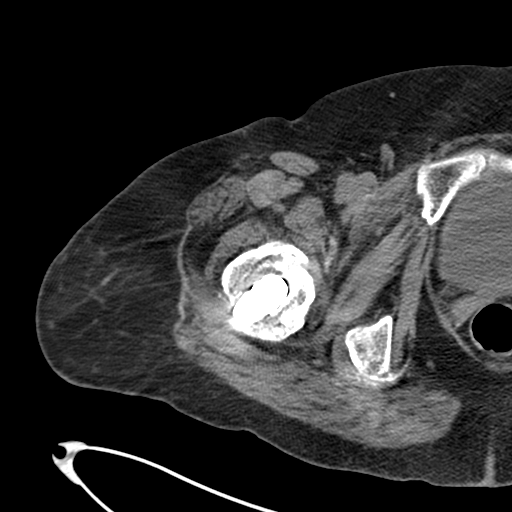
[im 85/139  soft-tissue]
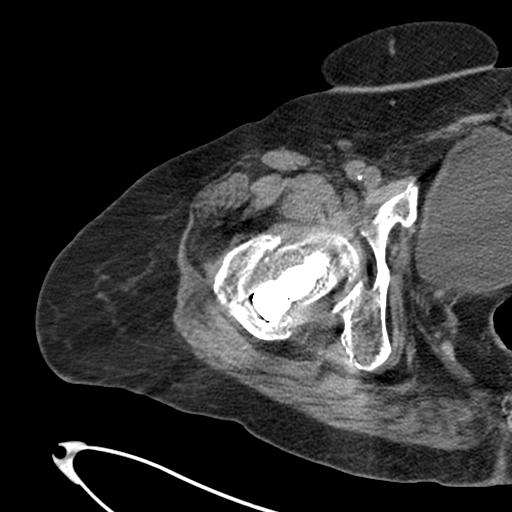
[im 85/139  bone]
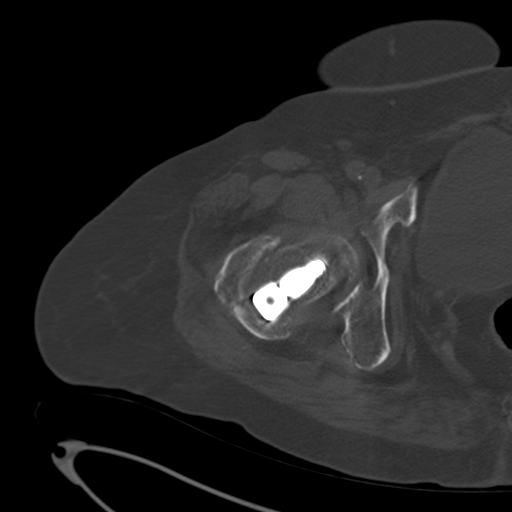
[im 94/139  soft-tissue]
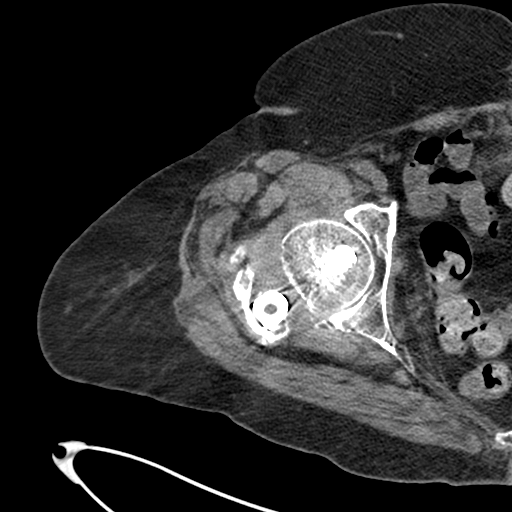
[im 103/139  soft-tissue]
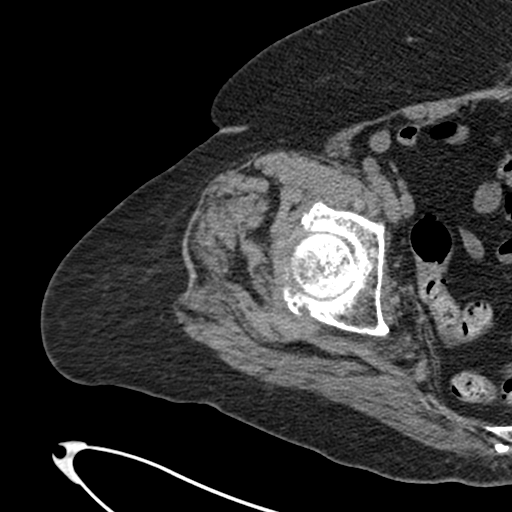
[im 112/139  soft-tissue]
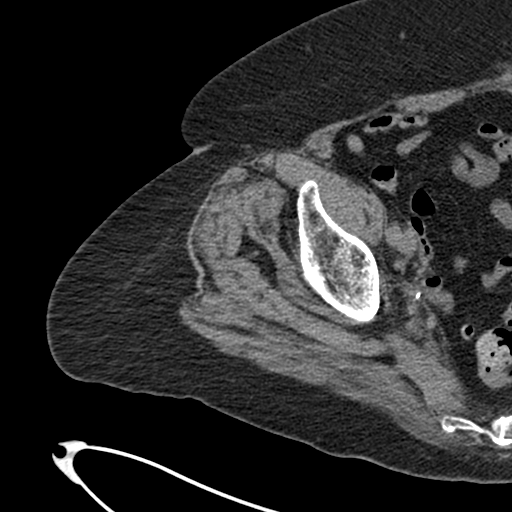
[im 121/139  soft-tissue]
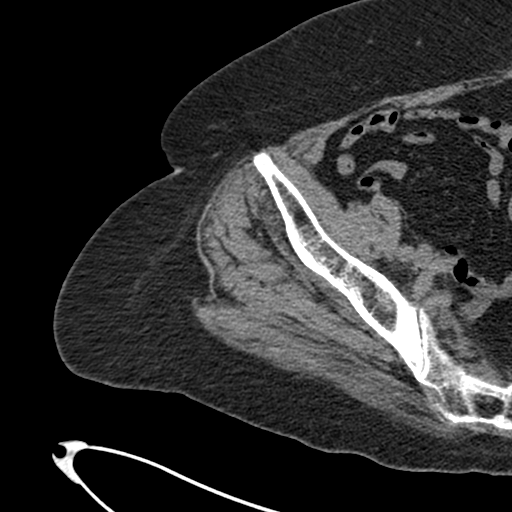
[im 130/139  soft-tissue]
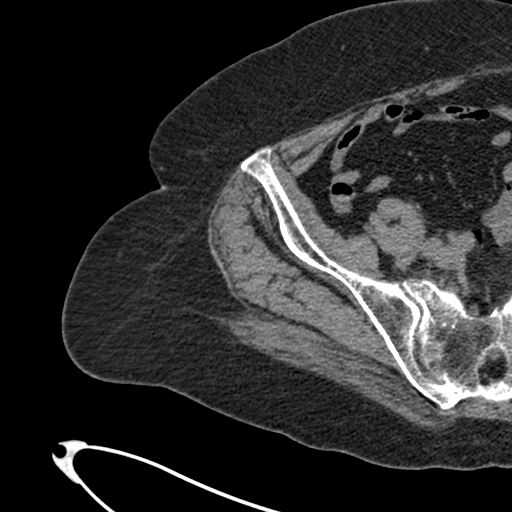

[Series 6: coronal st · coronal · 0.55mm/px · 3 of 100 slices shown]
[im 34/100  soft-tissue]
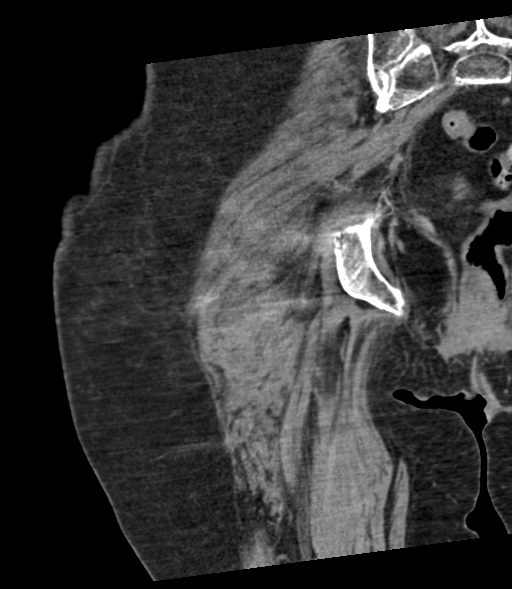
[im 45/100  soft-tissue]
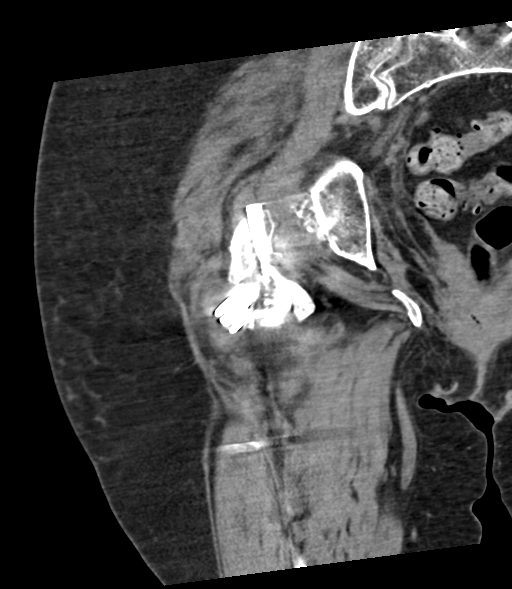
[im 56/100  soft-tissue]
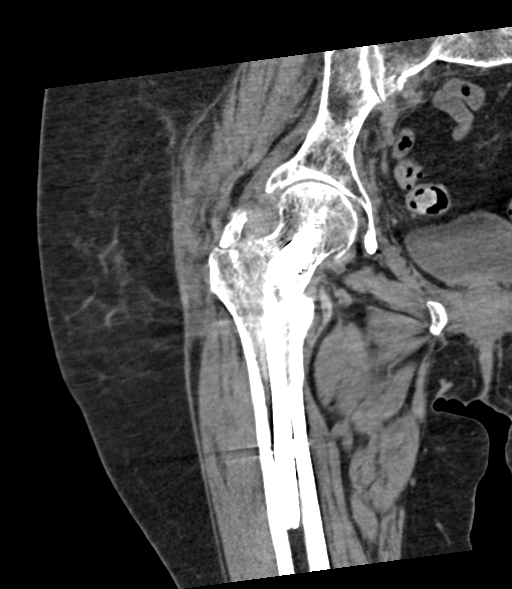

[17 of 46 positions shown; findings below may reference images not displayed]

FINDINGS: Bones/Joint/Cartilage

Chronic ununited intertrochanteric fracture with sclerotic margins
on either side of the fracture cleft transfixed with an
intramedullary nail and interlocking femoral neck screw.

No acute fracture or dislocation.

Normal alignment. No joint effusion.

Severe axial right hip joint space narrowing with a bone-on-bone
appearance and subchondral reactive marrow changes with small
inferior humeral marginal osteophytes consistent with advanced
osteoarthritis.

Ligaments

Ligaments are suboptimally evaluated by CT.

Muscles and Tendons
Muscles are normal.  No muscle atrophy.

Soft tissue
No fluid collection or hematoma.  No soft tissue mass.
IMPRESSION: 1. Chronic ununited intertrochanteric fracture with sclerotic
margins on either side of the fracture cleft transfixed with an
intramedullary nail and interlocking femoral neck screw.
2.  No acute osseous injury of the right hip.
3. Severe osteoarthritis of the right hip.

## 2019-04-02 ENCOUNTER — Other Ambulatory Visit: Payer: Self-pay

## 2019-04-02 NOTE — Progress Notes (Signed)
Patient pre screened for office appointment, no questions or concerns today. Reminded patient of her appt  Time for Monday.

## 2019-04-05 ENCOUNTER — Inpatient Hospital Stay: Payer: Medicare Other | Admitting: Oncology

## 2019-04-05 ENCOUNTER — Inpatient Hospital Stay: Payer: Medicare Other | Attending: Oncology

## 2019-04-05 ENCOUNTER — Telehealth: Payer: Self-pay | Admitting: Oncology

## 2019-04-05 NOTE — Telephone Encounter (Signed)
LM to try and R/S NS appt-ltg

## 2019-04-11 ENCOUNTER — Other Ambulatory Visit: Payer: Self-pay | Admitting: Family Medicine

## 2019-04-12 NOTE — Telephone Encounter (Signed)
Last OV 03/08/19 for acute visit Last filled 02/03/19 #30 x 1 refill.   No upcoming visit on file.   Please advise, thanks.

## 2019-04-12 NOTE — Telephone Encounter (Signed)
Please call patient's daughter and let her know that I have sent in refill to pharmacy. Request was just received today.

## 2019-04-12 NOTE — Telephone Encounter (Signed)
Juliann Pulse aware that the medication is ready to be picked up. Nothing further needed.

## 2019-04-12 NOTE — Telephone Encounter (Signed)
Juliann Pulse  Best number [phone]  (daughter) called checking on pt rx.  She stated pt is out of her meds

## 2019-04-19 ENCOUNTER — Encounter: Payer: Self-pay | Admitting: Family Medicine

## 2019-04-19 ENCOUNTER — Ambulatory Visit (INDEPENDENT_AMBULATORY_CARE_PROVIDER_SITE_OTHER): Payer: Medicare Other | Admitting: Family Medicine

## 2019-04-19 ENCOUNTER — Ambulatory Visit (INDEPENDENT_AMBULATORY_CARE_PROVIDER_SITE_OTHER)
Admission: RE | Admit: 2019-04-19 | Discharge: 2019-04-19 | Disposition: A | Discharge status: Home / Self Care | Payer: Medicare Other | Source: Ambulatory Visit | Attending: Family Medicine | Admitting: Family Medicine

## 2019-04-19 ENCOUNTER — Ambulatory Visit: Payer: Medicare Other | Admitting: Family Medicine

## 2019-04-19 ENCOUNTER — Other Ambulatory Visit: Payer: Self-pay

## 2019-04-19 VITALS — BP 122/78 | HR 95 | Temp 98.0°F | Wt 167.4 lb

## 2019-04-19 DIAGNOSIS — N3090 Cystitis, unspecified without hematuria: Secondary | ICD-10-CM

## 2019-04-19 DIAGNOSIS — R8290 Unspecified abnormal findings in urine: Secondary | ICD-10-CM | POA: Diagnosis not present

## 2019-04-19 DIAGNOSIS — S8991XA Unspecified injury of right lower leg, initial encounter: Secondary | ICD-10-CM

## 2019-04-19 DIAGNOSIS — R300 Dysuria: Secondary | ICD-10-CM

## 2019-04-19 LAB — POCT URINALYSIS DIPSTICK
Bilirubin, UA: NEGATIVE
Glucose, UA: NEGATIVE
Ketones, UA: NEGATIVE
Nitrite, UA: NEGATIVE
Protein, UA: POSITIVE — AB
Spec Grav, UA: 1.015 (ref 1.010–1.025)
Urobilinogen, UA: 0.2 E.U./dL
pH, UA: 6 (ref 5.0–8.0)

## 2019-04-19 MED ORDER — CIPROFLOXACIN HCL 250 MG PO TABS: 250.0000 mg | ORAL_TABLET | Freq: Two times a day (BID) | ORAL | 0 refills | Status: AC

## 2019-04-19 NOTE — Progress Notes (Signed)
Subjective:    Patient ID: [name], female    DOB: 1940-05-30, 78 y.o.   MRN: [id number]  HPI Chief Complaint  Patient presents with  . Leg Pain    right lower leg pain since fall - tripped anfd fell into the recliner at home striking her right lower leg. Pt feels it might be fractured. Hard to stand with weight on leg.   Marland Kitchen Urinary Tract Infection    Foul odor, was having some burning with urination but has been better since taking AZO    This is a 78 yo female who presents today with above cc. She is accompanied by her daughter, [name].   Several days ago, patient lost her balance and fell into side of her recliner, striking her left lower leg. She is concerned about possible fracture due to her known osteoporosis and previous fractures.   UTI symptoms- poor fluid intake. No fever, abdominal pain. Has history of nausea and and vomiting after eating and has upcoming appointment with GI. Chronic constipation, on Linzess.    Past Medical History:  Diagnosis Date  . Acute postoperative pain 02/03/2018  . Anemia   . B12 deficiency   . Bacteremia 10/07/2014  . Bladder incontinence   . Blind left eye   . Cancer ([place])    skin cancer   . Cataract   . Cervical spine fracture ([place])   . Chest pain 07/29/2013  . Compression fracture    C7,  upper T spine -compression fx  . COPD (chronic obstructive pulmonary disease) ([place])   . DDD (degenerative disc disease), lumbar   . Depression    major  . Diffuse myofascial pain syndrome 03/24/2015  . Dyspnea    at times- when activty  . Fever 10/05/2014  . GERD (gastroesophageal reflux disease)   . Hiatal hernia   . Hyperlipidemia   . Hypertension   . 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 Macular degeneration   . On home oxygen therapy    "3L; all the time" (04/30/2018)  . Osteoarthritis   . Osteoporosis   . Pneumonia    years ago  . Pulmonary embolism ([place]) 04/30/2018  . Reactive airway disease   . Rupture of bowel  ([place])   . Sepsis ([place]) 10/08/2014  . Stroke [facility])    "mini stroke"- balance and memory issue  . Stroke ([place])   . Wrist pain, acute 09/10/2012   Past Surgical History:  Procedure Laterality Date  . ABDOMINAL HYSTERECTOMY    . ABDOMINAL SURGERY    . APPENDECTOMY    . BLADDER SURGERY    . CATARACT EXTRACTION W/ INTRAOCULAR LENS  IMPLANT, BILATERAL Bilateral   . CHOLECYSTECTOMY    . COLON SURGERY    . COLOSTOMY REVERSAL    . ESOPHAGOGASTRODUODENOSCOPY N/A 10/30/2018   Procedure: ESOPHAGOGASTRODUODENOSCOPY (EGD);  Surgeon: Lin Landsman, MD;  Location: [facility] ENDOSCOPY;  Service: Gastroenterology;  Laterality: N/A;  . INTRAMEDULLARY (IM) NAIL INTERTROCHANTERIC Right 09/13/2016   Procedure: INTRAMEDULLARY (IM) NAIL INTERTROCHANTRIC RIGHT;  Surgeon: Leandrew Koyanagi, MD;  Location: [place];  Service: Orthopedics;  Laterality: Right;  . JOINT REPLACEMENT Bilateral   . KNEE SURGERY    . OSTOMY    . RECTOCELE REPAIR    . TOE SURGERY Right    3rd  . TOTAL HIP ARTHROPLASTY Right 09/30/2017   Procedure: RIGHT HIP IM NAIL REMOVAL WITH CONVERSION TO TOTAL HIP ARTHOPLASTY, anterior approach;  Surgeon: Renette Butters, MD;  Location: [place];  Service: Orthopedics;  Laterality: Right;   Family History  Problem Relation Age of Onset  . Heart failure Mother   . Heart attack Father   . Lung cancer Sister   . Deep vein thrombosis Sister   . Pulmonary embolism Son   . Deep vein thrombosis Son   . Cervical cancer Daughter    Social History   Tobacco Use  . Smoking status: Former Smoker    Packs/day: 1.50    Years: 30.00    Pack years: 45.00    Types: E-cigarettes, Cigarettes    Quit date: 2013    Years since quitting: 7.8  . Smokeless tobacco: Never Used  Substance Use Topics  . Alcohol use: No    Alcohol/week: 0.0 standard drinks  . Drug use: No      Review of Systems    per HPI Objective:   Physical Exam Vitals signs reviewed.  Constitutional:      General: She is not in acute  distress.    Appearance: Normal appearance. She is normal weight. She is not ill-appearing, toxic-appearing or diaphoretic.     Comments: Appears stated age. Sitting in wheelchair.   HENT:     Head: Normocephalic and atraumatic.     Right Ear: External ear normal.     Left Ear: External ear normal.  Eyes:     Conjunctiva/sclera: Conjunctivae normal.  Cardiovascular:     Rate and Rhythm: Normal rate and regular rhythm.     Heart sounds: Normal heart sounds.  Pulmonary:     Effort: Pulmonary effort is normal.     Breath sounds: Normal breath sounds.  Musculoskeletal:     Right lower leg: No edema.     Left lower leg: No edema.     Comments: Right lower, lateral leg without visible defect, erythema, ecchymosis. Area tender to palpation, no palpable defect.   Skin:    General: Skin is warm and dry.  Neurological:     Mental Status: She is alert and oriented to person, place, and time.  Psychiatric:        Mood and Affect: Mood normal.        Thought Content: Thought content normal.        Judgment: Judgment normal.       BP 122/78 (BP Location: Left Arm, Patient Position: Sitting, Cuff Size: Normal)   Pulse 95   Temp 98 F (36.7 C) (Temporal)   Wt 167 lb 6.4 oz (75.9 kg)   SpO2 94%   BMI 28.73 kg/m  Wt Readings from Last 3 Encounters:  04/19/19 167 lb 6.4 oz (75.9 kg)  03/08/19 178 lb (80.7 kg)  03/05/19 178 lb (80.7 kg)   Results for orders placed or performed in visit on 04/19/19  Urinalysis Dipstick  Result Value Ref Range   Color, UA yellow    Clarity, UA very cloudy    Glucose, UA Negative Negative   Bilirubin, UA neg    Ketones, UA neg    Spec Grav, UA 1.015 1.010 - 1.025   Blood, UA +-    pH, UA 6.0 5.0 - 8.0   Protein, UA Positive (A) Negative   Urobilinogen, UA 0.2 0.2 or 1.0 E.U./dL   Nitrite, UA neg    Leukocytes, UA Large (3+) (A) Negative   Appearance     Odor          Assessment & Plan:  1. Abnormal urine odor - Urinalysis Dipstick -  Urine Culture  2.  Dysuria - Urinalysis Dipstick - Urine Culture  3. Abnormal urinalysis - Urine Culture  4. Injury of right lower extremity, initial encounter - continue current meds, can add heat/ice, topical pain relievers - DG Tibia/Fibula Right; Future  5. Cystitis - increase fluids, RTC/ER precautions reviewed - ciprofloxacin (CIPRO) 250 MG tablet; Take 1 tablet (250 mg total) by mouth every 12 (twelve) hours for 7 days.  Dispense: 14 tablet; Refill: 0  This visit occurred during the SARS-CoV-2 public health emergency.  Safety protocols were in place, including screening questions prior to the visit, additional usage of staff PPE, and extensive cleaning of exam room while observing appropriate contact time as indicated for disinfecting solutions.    Clarene Reamer, FNP-BC   Primary Care at [facility]  04/24/2019 7:35 AM

## 2019-04-19 NOTE — Patient Instructions (Signed)
Good to see you today  I will notify you of xray results  I have sent in an antibiotic to your pharmacy. Please increase fluids. Take Miralax as needed for constipation.

## 2019-04-21 LAB — URINE CULTURE
MICRO NUMBER:: 1129783
SPECIMEN QUALITY:: ADEQUATE

## 2019-04-24 ENCOUNTER — Encounter: Payer: Self-pay | Admitting: Family Medicine

## 2019-04-27 ENCOUNTER — Encounter: Payer: Self-pay | Admitting: Family Medicine

## 2019-04-28 ENCOUNTER — Other Ambulatory Visit: Payer: Self-pay | Admitting: Family Medicine

## 2019-04-28 MED ORDER — DULOXETINE HCL 40 MG PO CPEP: 40.0000 mg | ORAL_CAPSULE | Freq: Every day | ORAL | 5 refills | Status: DC

## 2019-04-30 ENCOUNTER — Telehealth: Payer: Self-pay

## 2019-04-30 ENCOUNTER — Other Ambulatory Visit: Payer: Self-pay

## 2019-04-30 ENCOUNTER — Encounter: Payer: Self-pay | Admitting: Family Medicine

## 2019-04-30 ENCOUNTER — Other Ambulatory Visit: Payer: Self-pay | Admitting: Family Medicine

## 2019-04-30 DIAGNOSIS — J849 Interstitial pulmonary disease, unspecified: Secondary | ICD-10-CM

## 2019-04-30 DIAGNOSIS — F339 Major depressive disorder, recurrent, unspecified: Secondary | ICD-10-CM

## 2019-04-30 DIAGNOSIS — N183 Chronic kidney disease, stage 3 unspecified: Secondary | ICD-10-CM

## 2019-04-30 DIAGNOSIS — G894 Chronic pain syndrome: Secondary | ICD-10-CM

## 2019-04-30 MED ORDER — DULOXETINE HCL 20 MG PO CPEP: 40.0000 mg | ORAL_CAPSULE | Freq: Every day | ORAL | 3 refills | Status: DC

## 2019-04-30 NOTE — Telephone Encounter (Signed)
Spoke with [place] at [place] [facility] and she asked to get this RX sent in and she would follow up back if this will not work. RX sent in and awaiting reply.

## 2019-04-30 NOTE — Telephone Encounter (Signed)
Received fax from [facility] that Duloxetine 40 mg is not covered. I called and spoke with [place] at [facility] and she said she thinks its the dose. Duloxetine 30 mg and 60 mg are covered usually. Debbie, before trying PA for 40 mg dose do you want to make any changes to the dose instead?

## 2019-04-30 NOTE — Progress Notes (Signed)
Palliative care referral placed per patient/daughter request.

## 2019-04-30 NOTE — Telephone Encounter (Signed)
Called and spoke with patient's daughter, [name].  Patient continues to have marked amounts of pain.  She has been following up with pain management and has an upcoming appointment next week.  She has been having increasing depression, daytime sleeping, insomnia and sense of hopelessness.  You just recently gone up on her duloxetine.  Discussed getting palliative care consult instead of hospice consult with patient's daughter.  I am not sure that she has a qualifying diagnosis for hospice care at this point.  I will put in a palliative care consult to see if they have any suggestions and can help clarify goals of treatment.

## 2019-04-30 NOTE — Telephone Encounter (Signed)
[place] worker with [facility] palliative [facility] left v/m that she had spoken with pt and Juliann Pulse pts daughter and pt has wt loss, uncontrollable pain and not sleeping well. Juliann Pulse is having to help pt more with walking and basic ADL. Request hospice referral and if agrees [name] FNP can choose hospice of chose.

## 2019-04-30 NOTE — Telephone Encounter (Signed)
Can you call and see if I can get #60 of 20 mg covered so she could take 2? I hate to increase her dose to 60 from 20 given her age.

## 2019-05-03 ENCOUNTER — Encounter: Payer: Self-pay | Admitting: Gastroenterology

## 2019-05-03 ENCOUNTER — Telehealth: Payer: Self-pay | Admitting: Oncology

## 2019-05-03 ENCOUNTER — Ambulatory Visit: Payer: Medicare Other | Admitting: Gastroenterology

## 2019-05-03 ENCOUNTER — Inpatient Hospital Stay: Payer: Medicare Other

## 2019-05-03 ENCOUNTER — Telehealth: Payer: Self-pay | Admitting: Adult Health Nurse Practitioner

## 2019-05-03 DIAGNOSIS — K920 Hematemesis: Secondary | ICD-10-CM

## 2019-05-03 NOTE — Telephone Encounter (Signed)
Tried to return VM; no VM set up.

## 2019-05-03 NOTE — Telephone Encounter (Signed)
Spoke with patient's [name] regarding Palliative services and all questions answered and she was in agreement with this.  I have scheduled a Telephone Consult for 05/11/19 @ 1 PM

## 2019-05-04 ENCOUNTER — Encounter: Payer: Self-pay | Admitting: Oncology

## 2019-05-04 ENCOUNTER — Other Ambulatory Visit: Payer: Self-pay

## 2019-05-04 ENCOUNTER — Other Ambulatory Visit: Payer: Medicare Other

## 2019-05-04 ENCOUNTER — Telehealth: Payer: Self-pay

## 2019-05-04 ENCOUNTER — Inpatient Hospital Stay: Payer: Medicare Other | Attending: Oncology | Admitting: Oncology

## 2019-05-04 DIAGNOSIS — D649 Anemia, unspecified: Secondary | ICD-10-CM | POA: Insufficient documentation

## 2019-05-04 DIAGNOSIS — Z86711 Personal history of pulmonary embolism: Secondary | ICD-10-CM | POA: Insufficient documentation

## 2019-05-04 DIAGNOSIS — Z86718 Personal history of other venous thrombosis and embolism: Secondary | ICD-10-CM

## 2019-05-04 DIAGNOSIS — I2699 Other pulmonary embolism without acute cor pulmonale: Secondary | ICD-10-CM | POA: Diagnosis not present

## 2019-05-04 MED ORDER — APIXABAN 2.5 MG PO TABS: 2.5000 mg | ORAL_TABLET | Freq: Two times a day (BID) | ORAL | 3 refills | Status: DC

## 2019-05-04 NOTE — Progress Notes (Signed)
HEMATOLOGY-ONCOLOGY TeleHEALTH VISIT PROGRESS NOTE  I connected with [name] on 05/04/19 at  2:30 PM EST by video enabled telemedicine visit and verified that I am speaking with the correct person using two identifiers. I discussed the limitations, risks, security and privacy concerns of performing an evaluation and management service by telemedicine and the availability of in-person appointments. I also discussed with the patient that there may be a patient responsible charge related to this service. The patient expressed understanding and agreed to proceed.   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   INTERVAL HISTORY [name] is a 78 y.o. 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 Problems and complaints are listed below:  I attempted to connect the patient for visual enabled telehealth visit via [employer].  Due to the technical difficulties with video,  Patient was transitioned to audio only visit. Patient reports feeling well at baseline.  She has no new complaints. Per patient's daughter, patient is taking Eliquis 2.5 mg once daily instead of twice daily.  No reports of acute bleeding. Patient has chronic lower extremity swelling, unchanged.   Patient has recent UTI episode.  Was treated with antibiotics.  Reports symptom has improved. Review of Systems  Constitutional: Negative for appetite change, chills, fatigue and fever.  HENT:   Negative for hearing loss and voice change.   Eyes: Negative for eye problems.  Respiratory: Negative for chest tightness and cough.   Cardiovascular: Positive for leg swelling. Negative for chest pain.  Gastrointestinal: Negative for abdominal  distention, abdominal pain and blood in stool.  Endocrine: Negative for hot flashes.  Genitourinary: Negative for difficulty urinating and frequency.   Musculoskeletal: Negative for arthralgias.  Skin: Negative for itching and rash.  Neurological: Negative for extremity weakness.  Hematological: Negative for adenopathy.  Psychiatric/Behavioral: Negative for confusion.    Past Medical History:  Diagnosis Date  . Acute postoperative pain 02/03/2018  . Anemia   . B12 deficiency   . Bacteremia 10/07/2014  . Bladder incontinence   . Blind left eye   . Cancer ([place])    skin cancer   . Cataract   . Cervical spine fracture ([place])   . Chest pain 07/29/2013  . Compression fracture    C7,  upper T spine -compression fx  . COPD (chronic obstructive pulmonary disease) ([place])   . DDD (degenerative disc disease), lumbar   . Depression    major  . Diffuse myofascial pain syndrome 03/24/2015  . Dyspnea    at times- when activty  . Fever 10/05/2014  . GERD (gastroesophageal reflux disease)   . Hiatal hernia   . Hyperlipidemia   . Hypertension   . 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 Macular degeneration   . On home oxygen therapy    "3L; all the time" (04/30/2018)  . Osteoarthritis   . Osteoporosis   . Pneumonia    years ago  . Pulmonary embolism ([place]) 04/30/2018  . Reactive airway disease   . Rupture of bowel ([place])   . Sepsis ([place]) 10/08/2014  . Stroke [facility])    "mini stroke"- balance and memory issue  . Stroke ([place])   . Wrist pain, acute 09/10/2012   Past Surgical History:  Procedure Laterality Date  . ABDOMINAL HYSTERECTOMY    . ABDOMINAL SURGERY    . APPENDECTOMY    . BLADDER SURGERY    . CATARACT EXTRACTION W/ INTRAOCULAR LENS  IMPLANT, BILATERAL Bilateral   . CHOLECYSTECTOMY    . COLON SURGERY    . COLOSTOMY REVERSAL    . ESOPHAGOGASTRODUODENOSCOPY N/A 10/30/2018   Procedure: ESOPHAGOGASTRODUODENOSCOPY (EGD);  Surgeon: Lin Landsman, MD;  [facility]  ENDOSCOPY;  Service: Gastroenterology;  Laterality: N/A;  . INTRAMEDULLARY (IM) NAIL INTERTROCHANTERIC Right 09/13/2016   Procedure: INTRAMEDULLARY (IM) NAIL INTERTROCHANTRIC RIGHT;  Surgeon: Leandrew Koyanagi, MD;  Location: [place];  Service: Orthopedics;  Laterality: Right;  . JOINT REPLACEMENT Bilateral   . KNEE SURGERY    . OSTOMY    . RECTOCELE REPAIR    . TOE SURGERY Right    3rd  . TOTAL HIP ARTHROPLASTY Right 09/30/2017   Procedure: RIGHT HIP IM NAIL REMOVAL WITH CONVERSION TO TOTAL HIP ARTHOPLASTY, anterior approach;  Surgeon: Renette Butters, MD;  Location: [place];  Service: Orthopedics;  Laterality: Right;    Family History  Problem Relation Age of Onset  . Heart failure Mother   . Heart attack Father   . Lung cancer Sister   . Deep vein thrombosis Sister   . Pulmonary embolism Son   . Deep vein thrombosis Son   . Cervical cancer Daughter     Social History   Socioeconomic History  . Marital status: Widowed    Spouse name: Not on file  . Number of children: 3  . Years of education: middle sch  . Highest education level: Not on file  Occupational History  . Occupation: retired    Comment: n/a  Social Needs  . Financial resource strain: Not on file  . Food insecurity    Worry: Not on file    Inability: Not on file  . Transportation needs    Medical: Not on file    Non-medical: Not on file  Tobacco Use  . Smoking status: Former Smoker    Packs/day: 1.50    Years: 30.00    Pack years: 45.00    Types: E-cigarettes, Cigarettes    Quit date: 2013    Years since quitting: 7.9  . Smokeless tobacco: Never Used  Substance and Sexual Activity  . Alcohol use: No    Alcohol/week: 0.0 standard drinks  . Drug use: No  . Sexual activity: Not Currently  Lifestyle  . Physical activity    Days per week: Not on file    Minutes per session: Not on file  . Stress: Not on file  Relationships  . Social Herbalist on phone: Not on file    Gets together: Not on file     Attends religious service: Not on file    Active member of club or organization: Not on file    Attends meetings of clubs or organizations: Not on file    Relationship status: Not on file  . Intimate partner violence    Fear of current or ex partner: Not on file    Emotionally abused: Not on file    Physically abused: Not on file    Forced sexual activity: Not on file  Other Topics Concern  . Not on file  Social History Narrative   ** Merged History Encounter **       Patient lives at home with daughter. Caffeine Use: 4 cups daily    Current Outpatient Medications  on File Prior to Visit  Medication Sig Dispense Refill  . acetaminophen (TYLENOL) 500 MG tablet Take 2 tablets (1,000 mg total) by mouth 3 (three) times daily. (Patient taking differently: Take 1,000 mg by mouth 2 (two) times daily. ) 30 tablet 0  . albuterol (ACCUNEB) 1.25 MG/3ML nebulizer solution Take 3 mLs (1.25 mg total) by nebulization every 6 (six) hours as needed for wheezing. 75 mL 1  . albuterol (VENTOLIN HFA) 108 (90 Base) MCG/ACT inhaler Inhale 2 puffs into the lungs every 6 (six) hours as needed for wheezing or shortness of breath. 1 Inhaler 2  . alendronate (FOSAMAX) 70 MG tablet TAKE 1 TABLET BY MOUTH ONCE A WEEK 12 tablet 1  . ALPRAZolam (XANAX) 1 MG tablet Take 1 tablet (1 mg total) by mouth at bedtime as needed for anxiety. 30 tablet 1  . amLODipine (NORVASC) 5 MG tablet TAKE 1 TABLET BY MOUTH EVERY DAY 90 tablet 1  . benzonatate (TESSALON) 100 MG capsule Take 1-2 capsules (100-200 mg total) by mouth 3 (three) times daily as needed for cough. 40 capsule 0  . Cholecalciferol (VITAMIN D-3 PO) Take 2,000 Units by mouth daily.    Marland Kitchen docusate sodium (COLACE) 100 MG capsule Take 200 mg by mouth 2 (two) times daily as needed for moderate constipation.     . DULoxetine (CYMBALTA) 20 MG capsule Take 2 capsules (40 mg total) by mouth daily. 60 capsule 3  . esomeprazole (NEXIUM) 40 MG capsule Take 1 capsule (40 mg  total) by mouth daily. 90 capsule 1  . fluticasone (FLONASE) 50 MCG/ACT nasal spray Place 2 sprays into both nostrils daily. 16 g 6  . fluticasone (FLOVENT HFA) 220 MCG/ACT inhaler Inhale 1 puff into the lungs 2 (two) times daily. 1 Inhaler 11  . LINZESS 290 MCG CAPS capsule TAKE 1 CAPSULE BY MOUTH EVERY DAY 90 capsule 1  . lovastatin (MEVACOR) 10 MG tablet Take 1 tablet (10 mg total) by mouth daily. 90 tablet 3  . magic mouthwash w/lidocaine SOLN Take 10 mLs by mouth 4 (four) times daily as needed for mouth pain. 160 mL 0  . ondansetron (ZOFRAN ODT) 4 MG disintegrating tablet Take 1 tablet (4 mg total) by mouth every 8 (eight) hours as needed for nausea or vomiting. 30 tablet 0  . Oxycodone HCl 10 MG TABS Take 10 mg by mouth 3 (three) times daily.    . polyethylene glycol (MIRALAX / GLYCOLAX) packet Take 17 g by mouth daily as needed (for constipation.).     Marland Kitchen pregabalin (LYRICA) 100 MG capsule TAKE 1 CAPSULE BY MOUTH TWICE A DAY 180 capsule 1  . promethazine-codeine (PHENERGAN WITH CODEINE) 6.25-10 MG/5ML syrup Take 5 mLs by mouth every 8 (eight) hours as needed for cough. 120 mL 0  . traZODone (DESYREL) 50 MG tablet TAKE 0.5-1 TABLETS (25-50 MG TOTAL) BY MOUTH AT BEDTIME AS NEEDED FOR SLEEP. 90 tablet 0  . vitamin B-12 (CYANOCOBALAMIN) 500 MCG tablet Take 500 mcg by mouth daily.    . Calcium Carbonate (CALCIUM 600 PO) Take 1 tablet by mouth daily.    . cyclobenzaprine (FLEXERIL) 10 MG tablet SMARTSIG:1 Tablet(s) By Mouth Every 12 Hours    . Krill Oil 500 MG CAPS Take 500 mg by mouth daily.     Marland Kitchen lidocaine-prilocaine (EMLA) cream     . NARCAN 4 MG/0.1ML LIQD nasal spray kit      No current facility-administered medications on file prior to visit.     Allergies  Allergen Reactions  . Ampicillin Anaphylaxis, Nausea Only and Swelling    SEVERE HEADACHE MUSCLE CRAMPS ANGIOEDEMA THRUSH PATIENT HAS HAD A PCN REACTION WITH IMMEDIATE RASH, FACIAL/TONGUE/THROAT SWELLING, SOB, OR LIGHTHEADEDNESS  WITH HYPOTENSION:  #  #  YES  #  #  Has patient had a PCN reaction causing severe rash involving mucus membranes or skin necrosis: No Has patient had a PCN reaction that required hospitalization:No Has patient had a PCN reaction occurring within the last 10 years: No.  . Ampicillin-Sulbactam Sodium Anaphylaxis and Other (See Comments)    SEVERE HEADACHE MUSCLE CRAMPS ANGIOEDEMA THRUSH PATIENT HAS HAD A PCN REACTION WITH IMMEDIATE RASH, FACIAL/TONGUE/THROAT SWELLING, SOB, OR LIGHTHEADEDNESS WITH HYPOTENSION: # # YES # # Has patient had a PCN reaction causing severe rash involving mucus membranes or skin necrosis: No Has patient had a PCN reaction that required hospitalization:No Has patient had a PCN reaction occurring within the last 10 years: No   . Ambien [Zolpidem Tartrate] Nausea And Vomiting and Other (See Comments)    HALLUCINATIONS SWEATING  . Flexeril [Cyclobenzaprine] Other (See Comments)    EXTRAPYRAMIDAL MOVEMENT INVOLUNTARY MUSCLE JERKING  . Tape Itching, Rash and Other (See Comments)    Paper tape only. Adhesive tape=itching/burning/rash  . Toradol [Ketorolac Tromethamine] Nausea Only and Other (See Comments)    HEADACHE  BACKACHE  . Sulfamethoxazole-Trimethoprim     UNSPECIFIED REACTION   . Zolpidem     Hallucinations  . Cephalexin Rash and Other (See Comments)    HEADACHES  . Tramadol Rash and Other (See Comments)    HEADACHE        Observations/Objective: There were no vitals filed for this visit. There is no height or weight on file to calculate BMI.  Physical Exam  Neurological: She is alert.    CBC    Component Value Date/Time   WBC 4.7 01/05/2019 0959   RBC 4.18 01/05/2019 0959   HGB 10.8 (L) 01/05/2019 0959   HGB 14.7 12/04/2016 1525   HCT 35.6 (L) 01/05/2019 0959   HCT 44.8 12/04/2016 1525   PLT 186 01/05/2019 0959   PLT 263 12/04/2016 1525   MCV 85.2 01/05/2019 0959   MCV 92 12/04/2016 1525   MCH 25.8 (L) 01/05/2019 0959   MCHC 30.3  01/05/2019 0959   RDW 15.8 (H) 01/05/2019 0959   RDW 14.4 12/04/2016 1525   LYMPHSABS 1.7 01/05/2019 0959   LYMPHSABS 2.6 12/04/2016 1525   MONOABS 0.5 01/05/2019 0959   EOSABS 0.2 01/05/2019 0959   EOSABS 0.3 12/04/2016 1525   BASOSABS 0.1 01/05/2019 0959   BASOSABS 0.0 12/04/2016 1525    CMP     Component Value Date/Time   NA 142 01/05/2019 0959   NA 145 (H) 12/04/2016 1525   K 4.1 01/05/2019 0959   CL 107 01/05/2019 0959   CO2 28 01/05/2019 0959   GLUCOSE 101 (H) 01/05/2019 0959   BUN 9 01/05/2019 0959   BUN 7 (L) 12/04/2016 1525   CREATININE 0.99 01/05/2019 0959   CALCIUM 9.4 01/05/2019 0959   PROT 7.4 01/05/2019 0959   PROT 7.7 12/04/2016 1525   ALBUMIN 3.4 (L) 01/05/2019 0959   ALBUMIN 4.0 12/04/2016 1525   AST 22 01/05/2019 0959   ALT 21 01/05/2019 0959   ALKPHOS 95 01/05/2019 0959   BILITOT 0.4 01/05/2019 0959   BILITOT 0.2 12/04/2016 1525   GFRNONAA 55 (L) 01/05/2019 0959   GFRAA >60 01/05/2019 0959     Assessment and Plan: 1. History of deep  vein thrombosis of lower extremity   2. Anemia, unspecified type   3. PE (pulmonary thromboembolism) ([place])     #History of DVT and PE, I discussed with patient and daughter in details and instructed patient to take Eliquis 2.5 mg twice daily instead of once daily. Patient and her daughter voiced understanding about the medication instruction.  Continue compression stocking for chronic lower extremity edema. Anemia, chronic.  Hemoglobin has baseline.patient has not had labs done today.  She is going to have blood work done Architectural technologist.  Check B12, folate, iron, TIBC, ferritin. Follow Up Instructions: 4 months.   I discussed the assessment and treatment plan with the patient. The patient was provided an opportunity to ask questions and all were answered. The patient agreed with the plan and demonstrated an understanding of the instructions.  The patient was advised to call back or seek an in-person evaluation if the  symptoms worsen or if the condition fails to improve as anticipated.   I provided 15 minutes of face-to-face video visit time during this encounter, and > 50% was spent counseling as documented under my assessment & plan.  Earlie Server, MD 05/04/2019 9:08 PM

## 2019-05-04 NOTE — Progress Notes (Signed)
Patient contacted for telehealth visit. Pt complains of nausea and constipation. She missed lab appointment on 12/7 and lab has been rescheduled to 12/9.

## 2019-05-05 ENCOUNTER — Inpatient Hospital Stay: Payer: Medicare Other

## 2019-05-05 ENCOUNTER — Ambulatory Visit: Payer: Self-pay

## 2019-05-05 ENCOUNTER — Telehealth: Payer: Self-pay | Admitting: *Deleted

## 2019-05-05 NOTE — Telephone Encounter (Signed)
Call returned to [facility] and she was advised per VERBAL ORDER Dr Tasia Catchings to hold the Eliquis for now and to call her lung doctor or PCP immediately. She states she will call her PCP as she does not have a lung doctor. She also reports that the bleeding has stopped

## 2019-05-05 NOTE — Telephone Encounter (Signed)
Pt notified of Dr. Marliss Coots comments.    Her eliquis is given for h/o of blood clot (DVT and PE): Yes eliquis is for DVT and a PE How is she feeling?: daughter said she has no appetite and not drinking much today. Pt had one fainting spell today but daughter said that is normal for pt and it's due to her pain Any further blood?: no blood since this morning  Chest pain?: no chest pain Does she cough baseline? Yes and [name], NP has been helping with that, she advised them to take mucinex that helps the coughing Does she feel like she may have pneumonia or bronchitis? Daughter doesn't think so Any history of lung nodules or lung cancer ? No just scarring in her lungs due to a past pneumonia infection and her baseline COPD  Daughter said sxs didn't start until she was given the 2nd does of the eliquis as previously stated in Rena's message

## 2019-05-05 NOTE — Telephone Encounter (Signed)
Daughter is calling PCP per VERBAL ORDER Dr Tasia Catchings

## 2019-05-05 NOTE — Telephone Encounter (Signed)
Spoke with [facility]  - they did receive new Rx - covered at 100% This has been picked up by the patient.  Nothing further needed.

## 2019-05-05 NOTE — Telephone Encounter (Signed)
If she would like to be evaluated, she can come in tomorrow. Let me know what Dr. Tasia Catchings says.   Faythe Casa, NP 05/05/2019 1:54 PM

## 2019-05-05 NOTE — Telephone Encounter (Signed)
I spoke with [name] pts daughter (DPR signed) and [name] said that [name] had ordered  [name] 2.5 mg taking 1 tab po bid. [name] thought pt was supposed to just take one [name] 2.5 mg daily. On 05/04/19 when [name] realized pt should be taking [name] twice a day she gave pt [name] 2.5 mg bid yesterday. So [name] thinks that may have caused pt to split up a Tbsp of dark red blood at 9 AM this morning. [name] spoke with [name] office and was advised for pt to stop blood thinner and call lung Nicole(pt does not have lung Nicole per pts daughter or the PCP). [name] pt said pt is not coughing and has not coughed up any more blood since this morning. [name] out of office until next wk and [name] will review note. [name] will wait for cb and ED precautions given and [name] voiced understanding.

## 2019-05-05 NOTE — Telephone Encounter (Signed)
I reviewed the chart and it looks like this pt has chronic lung disease and is under palliative care (as well as chronic pain and other issues)  Her PCP is out of the office this week  Her eliquis is given for h/o of blood clot (DVT and PE)   Let me know if this is not correct  I saw that Dr Tasia Catchings told her to hold the eliquis   We cannot see any patients with respiratory symptoms in this office currently due to covid 19  She may need evaluation and a cxr   How is she feeling?  Any further blood? Chest pain?  Does she cough baseline? Does she feel like she may have pneumonia or bronchitis?  Any history of lung nodules or lung cancer ?  Sorry for all the questions- I am not PCP   Will cc this to Dr Tasia Catchings as well

## 2019-05-05 NOTE — Telephone Encounter (Signed)
Patient Daughter calling stating that Patient was at Dr,  Tasia Catchings office.  Stated that Patient is to be taking Eliquis 2.5 BID.  Daught has been only getting one dose per day.  Gave Patient two doses yesterday.   Woke up this morning coughing up blood.  Bight red then dark brown . Daughter wants to know if she should continue to does bid.  Would like a return call back to her daughter to [name].

## 2019-05-05 NOTE — Telephone Encounter (Signed)
Daughter called reporting that patient restarted her Eliquis 2.5 mg and she is now coughing up >1 Tbsp of blood. Please advise

## 2019-05-05 NOTE — Telephone Encounter (Signed)
I spoke to pt's daughter- she said on the blood thinner that she occasionally does cough up a bit of blood and it is not a big deal.  She is not more sob than usual and not acting sick.  They would like to avoid a trip to the ED or UC unless absolutely necessary.   She says pt coughs very hard /tramatically at times (this is her baseline)  I inst her to watch closely tonight for increased cough or any sob or fever (ED if so) If the night goes well she will resume eliquis in am and let us and Dr Tasia Catchings know how it goes If the hemoptysis returns-she will need to be evaluated in a clinic who can see her   Cc to PCP who is out of the office to keep her in the loop

## 2019-05-06 NOTE — Telephone Encounter (Signed)
Noted  

## 2019-05-10 ENCOUNTER — Encounter: Payer: Self-pay | Admitting: Family Medicine

## 2019-05-10 NOTE — Telephone Encounter (Signed)
Error

## 2019-05-10 NOTE — Telephone Encounter (Signed)
I have sent a message back to the patient's daughter for more information on symptoms.

## 2019-05-10 NOTE — Telephone Encounter (Signed)
Will send to [facility] for further recommendations

## 2019-05-11 ENCOUNTER — Other Ambulatory Visit: Payer: Self-pay

## 2019-05-11 ENCOUNTER — Other Ambulatory Visit: Payer: Medicare Other | Admitting: Adult Health Nurse Practitioner

## 2019-05-11 ENCOUNTER — Other Ambulatory Visit: Payer: Self-pay | Admitting: Family Medicine

## 2019-05-11 DIAGNOSIS — Z79891 Long term (current) use of opiate analgesic: Secondary | ICD-10-CM

## 2019-05-11 DIAGNOSIS — M899 Disorder of bone, unspecified: Secondary | ICD-10-CM

## 2019-05-11 DIAGNOSIS — G8929 Other chronic pain: Secondary | ICD-10-CM

## 2019-05-11 DIAGNOSIS — Z515 Encounter for palliative care: Secondary | ICD-10-CM

## 2019-05-11 DIAGNOSIS — G894 Chronic pain syndrome: Secondary | ICD-10-CM

## 2019-05-11 DIAGNOSIS — S72001S Fracture of unspecified part of neck of right femur, sequela: Secondary | ICD-10-CM

## 2019-05-11 NOTE — Progress Notes (Signed)
Therapist, nutritional Palliative Care Consult Note Telephone: [phone]  Fax: [phone]  PATIENT NAME: [name] DOB: 1941/04/29 MRN: [id number]  PRIMARY CARE PROVIDER:   Emi Belfast, FNP  REFERRING PROVIDER:  Emi Belfast, FNP [address] [place],  [place] [address]  RESPONSIBLE PARTY:   [name], daughter C:  [phone] H: [phone]  Due to the COVID-19 crisis, this visit was done via telemedicine and it was initiated and consent by this patient and or family. Video-audio (telehealth) contact was unable to be done due to technical barriers from the patient's side.    RECOMMENDATIONS and PLAN:  1.  Advanced care planning.  Patient is DNR from documentation in Epic on 10/29/2018.  Briefly went over ACP and patient and daughter agreed to have Hard Choices book and blank MOST form sent to them.    2.  Pain.  Patient has chronic pain related to right hip  fracture and surgical repair about 3 years ago.  Daughter states that the bone never healed to the rod and she underwent another surgery to use a longer rod.  The bone did not heal to the rod after this surgery either.  She is not a candidate for revision. Patient states that the pain is constant and that it feels like a saw going through her leg.  The pain starts in her right hip and goes down to just above her knee. States that the pain keeps her from doing what she used to do, such as house work, cooking, and working in the yard.  She cries a lot because of the pain.  Daughter states that she has passed out from the pain.  Can do ADLs but when the pain is really bad she may need some help, especially with putting on her pants. States that her pain occasionally keeps her up at night but takes trazodone and xanax at night to help her sleep. Has tried topical creams and patches, nerve blocks, joint injections, muscle relaxers, and PT without relief.  Cannot tolerate tramadol or toradol.   Currently is on oxycodone 10 mg Q8 hrs PRN and Lyrica 100 mg BID.  States that the pain may go from a 10/10 to a 9/10.  Is being seen at [facility].  States that she has problems going to [facility] for appointments at pain clinic due to poor vision.  Patient is also currently on Cymbalta 40 mg daily.  Patient has depression and anxiety related to the pain and has made comments that she would rather just have the leg removed.  Denies SI.  She was on celexa but daughter states that this quit working.  She had tried nortriptylene but this sedated her.  Discussed that the bone pain can be hard to treat and that depression/anxiety can also worsen pain.    Some recommendations that can be tried:      Fentanyl patch starting at lowest dose and titrating up as needed.  Would leave the oxycodone at lower dose for breakthrough pain      Amytriptylene may be a possibility but in light of the nortriptylene causing sedation, this may cause sedation as well.      Increasing the Lyrica 100 mg from BID to TID      Adding something more during the day for anxiety/depression such as zoloft or adding a daytime dose of xanax to see if the anxiety/depression is causing increased pain.   Would only try one  thing at a time to monitor for effectiveness.  Patient and daughter understand that the pain will not be completely eliminated but would like the pain reduced so patient can start doing and enjoying some of the things she used to do and enjoy.    3.  Appetite.  Patient does not have much of an appetite and has lost 27 pounds over the past 2-3 months.  Daughter states that she vomits after eating anything.  Has zofran for this but states it doesn't give much relief.  Daughter states that phenergan suppositories help better but she does not having a rectal treatment everyday.  Does not notice a pattern with certain foods causing the vomiting.  Daughter does state that this has been an ongoing issue for a long time due to  a hiatal hernia but has gotten worse.  Recommend GI consult to evaluate if something underlying is going on.    4.  Constipation.  Patient has chronic constipation and opioid use worsens this.  She take Linzess 290 mcg daily and stool softener daily.  States taking Miralax and bisacodyl suppository PRN.  Last BM was today but states that it has been 5 days since having a BM. Daughter states that she does not drink much water and will have frequent UTIs as well.  Have encouraged taking Miralax daily and increasing water intake.   Patient does not report any recent falls.  Has been treated for a UTI recently.  No recent hospital visits.  Have appointment in 2 weeks to reevaluate pain with any changes made based on recommendations.  Palliative care will continue to monitor for symptom management and decline and make recommendations as needed.      I spent 60 minutes providing this consultation,  from 1:00 to 2:00. More than 50% of the time in this consultation was spent coordinating communication.   HISTORY OF PRESENT ILLNESS:  [name] is a 78 y.o. year old female with multiple medical problems including chronic right hip pain, malignant hypertensive kidney disease, COPD, HTN, OA, h/o CVA with balance and memory issues, h/o pulmonary embolism on eliquis. Palliative Care was asked to help address goals of care.   CODE STATUS: see above  PPS: 50% HOSPICE ELIGIBILITY/DIAGNOSIS: TBD  PHYSICAL EXAM:   Deferred  PAST MEDICAL HISTORY:  Past Medical History:  Diagnosis Date  . Acute postoperative pain 02/03/2018  . Anemia   . B12 deficiency   . Bacteremia 10/07/2014  . Bladder incontinence   . Blind left eye   . Cancer (HCC)    skin cancer   . Cataract   . Cervical spine fracture (HCC)   . Chest pain 07/29/2013  . Compression fracture    C7,  upper T spine -compression fx  . COPD (chronic obstructive pulmonary disease) (HCC)   . DDD (degenerative disc disease), lumbar   . Depression      major  . Diffuse myofascial pain syndrome 03/24/2015  . Dyspnea    at times- when activty  . Fever 10/05/2014  . GERD (gastroesophageal reflux disease)   . Hiatal hernia   . Hyperlipidemia   . Hypertension   . 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 Macular degeneration   . On home oxygen therapy    "3L; all the time" (04/30/2018)  . Osteoarthritis   . Osteoporosis   . Pneumonia    years ago  . Pulmonary embolism (HCC) 04/30/2018  . Reactive airway disease   . Rupture of bowel (HCC)   .  Sepsis (HCC) 10/08/2014  . Stroke [facility])    "mini stroke"- balance and memory issue  . Stroke (HCC)   . Wrist pain, acute 09/10/2012    SOCIAL HX:  Social History   Tobacco Use  . Smoking status: Former Smoker    Packs/day: 1.50    Years: 30.00    Pack years: 45.00    Types: E-cigarettes, Cigarettes    Quit date: 2013    Years since quitting: 7.9  . Smokeless tobacco: Never Used  Substance Use Topics  . Alcohol use: No    Alcohol/week: 0.0 standard drinks    ALLERGIES:  Allergies  Allergen Reactions  . Ampicillin Anaphylaxis, Nausea Only and Swelling    SEVERE HEADACHE MUSCLE CRAMPS ANGIOEDEMA THRUSH PATIENT HAS HAD A PCN REACTION WITH IMMEDIATE RASH, FACIAL/TONGUE/THROAT SWELLING, SOB, OR LIGHTHEADEDNESS WITH HYPOTENSION:  #  #  YES  #  #  Has patient had a PCN reaction causing severe rash involving mucus membranes or skin necrosis: No Has patient had a PCN reaction that required hospitalization:No Has patient had a PCN reaction occurring within the last 10 years: No.  . Ampicillin-Sulbactam Sodium Anaphylaxis and Other (See Comments)    SEVERE HEADACHE MUSCLE CRAMPS ANGIOEDEMA THRUSH PATIENT HAS HAD A PCN REACTION WITH IMMEDIATE RASH, FACIAL/TONGUE/THROAT SWELLING, SOB, OR LIGHTHEADEDNESS WITH HYPOTENSION: # # YES # # Has patient had a PCN reaction causing severe rash involving mucus membranes or skin necrosis: No Has patient had a PCN reaction that required  hospitalization:No Has patient had a PCN reaction occurring within the last 10 years: No   . Ambien [Zolpidem Tartrate] Nausea And Vomiting and Other (See Comments)    HALLUCINATIONS SWEATING  . Flexeril [Cyclobenzaprine] Other (See Comments)    EXTRAPYRAMIDAL MOVEMENT INVOLUNTARY MUSCLE JERKING  . Tape Itching, Rash and Other (See Comments)    Paper tape only. Adhesive tape=itching/burning/rash  . Toradol [Ketorolac Tromethamine] Nausea Only and Other (See Comments)    HEADACHE  BACKACHE  . Sulfamethoxazole-Trimethoprim     UNSPECIFIED REACTION   . Zolpidem     Hallucinations  . Cephalexin Rash and Other (See Comments)    HEADACHES  . Tramadol Rash and Other (See Comments)    HEADACHE      PERTINENT MEDICATIONS:  Outpatient Encounter Medications as of 05/11/2019  Medication Sig  . acetaminophen (TYLENOL) 500 MG tablet Take 2 tablets (1,000 mg total) by mouth 3 (three) times daily. (Patient taking differently: Take 1,000 mg by mouth 2 (two) times daily. )  . albuterol (ACCUNEB) 1.25 MG/3ML nebulizer solution Take 3 mLs (1.25 mg total) by nebulization every 6 (six) hours as needed for wheezing.  Marland Kitchen albuterol (VENTOLIN HFA) 108 (90 Base) MCG/ACT inhaler Inhale 2 puffs into the lungs every 6 (six) hours as needed for wheezing or shortness of breath.  Marland Kitchen alendronate (FOSAMAX) 70 MG tablet TAKE 1 TABLET BY MOUTH ONCE A WEEK  . ALPRAZolam (XANAX) 1 MG tablet Take 1 tablet (1 mg total) by mouth at bedtime as needed for anxiety.  Marland Kitchen amLODipine (NORVASC) 5 MG tablet TAKE 1 TABLET BY MOUTH EVERY DAY  . apixaban (ELIQUIS) 2.5 MG TABS tablet Take 1 tablet (2.5 mg total) by mouth 2 (two) times daily.  . benzonatate (TESSALON) 100 MG capsule Take 1-2 capsules (100-200 mg total) by mouth 3 (three) times daily as needed for cough.  . Calcium Carbonate (CALCIUM 600 PO) Take 1 tablet by mouth daily.  . Cholecalciferol (VITAMIN D-3 PO) Take 2,000 Units by mouth daily.  Marland Kitchen  cyclobenzaprine (FLEXERIL) 10  MG tablet SMARTSIG:1 Tablet(s) By Mouth Every 12 Hours  . docusate sodium (COLACE) 100 MG capsule Take 200 mg by mouth 2 (two) times daily as needed for moderate constipation.   . DULoxetine (CYMBALTA) 20 MG capsule Take 2 capsules (40 mg total) by mouth daily.  Marland Kitchen esomeprazole (NEXIUM) 40 MG capsule Take 1 capsule (40 mg total) by mouth daily.  . fluticasone (FLONASE) 50 MCG/ACT nasal spray Place 2 sprays into both nostrils daily.  . fluticasone (FLOVENT HFA) 220 MCG/ACT inhaler Inhale 1 puff into the lungs 2 (two) times daily.  Boris Lown Oil 500 MG CAPS Take 500 mg by mouth daily.   Marland Kitchen lidocaine-prilocaine (EMLA) cream   . LINZESS 290 MCG CAPS capsule TAKE 1 CAPSULE BY MOUTH EVERY DAY  . lovastatin (MEVACOR) 10 MG tablet Take 1 tablet (10 mg total) by mouth daily.  . magic mouthwash w/lidocaine SOLN Take 10 mLs by mouth 4 (four) times daily as needed for mouth pain.  Marland Kitchen NARCAN 4 MG/0.1ML LIQD nasal spray kit   . ondansetron (ZOFRAN ODT) 4 MG disintegrating tablet Take 1 tablet (4 mg total) by mouth every 8 (eight) hours as needed for nausea or vomiting.  . Oxycodone HCl 10 MG TABS Take 10 mg by mouth 3 (three) times daily.  . polyethylene glycol (MIRALAX / GLYCOLAX) packet Take 17 g by mouth daily as needed (for constipation.).   Marland Kitchen pregabalin (LYRICA) 100 MG capsule TAKE 1 CAPSULE BY MOUTH TWICE A DAY  . promethazine-codeine (PHENERGAN WITH CODEINE) 6.25-10 MG/5ML syrup Take 5 mLs by mouth every 8 (eight) hours as needed for cough.  . traZODone (DESYREL) 50 MG tablet TAKE 0.5-1 TABLETS (25-50 MG TOTAL) BY MOUTH AT BEDTIME AS NEEDED FOR SLEEP.  Marland Kitchen vitamin B-12 (CYANOCOBALAMIN) 500 MCG tablet Take 500 mcg by mouth daily.   No facility-administered encounter medications on file as of 05/11/2019.      Suha Schoenbeck Marlena Clipper, NP

## 2019-05-11 NOTE — Progress Notes (Signed)
Per patient and daughter request, referral made to oncology pain management.

## 2019-05-12 ENCOUNTER — Telehealth: Payer: Self-pay | Admitting: Family Medicine

## 2019-05-12 ENCOUNTER — Other Ambulatory Visit: Payer: Self-pay | Admitting: Family Medicine

## 2019-05-12 ENCOUNTER — Encounter: Payer: Self-pay | Admitting: Family Medicine

## 2019-05-12 NOTE — Telephone Encounter (Signed)
Last OV 04/19/2019 for an acute visit Last refilled 02/05/2019 #90 x 0 refills.   Please advise, thanks.

## 2019-05-12 NOTE — Telephone Encounter (Signed)
Patient and her daughter had previously requested referral to Altha Harm, NP at [facility] for consideration for pain management.  Spoke with Vonna Kotyk regarding this request and he does not feel that he has much to offer patient as she does not have a cancer diagnosis but has been seen at the cancer center for hematologic problems (DVT, PE) and she is currently being seen by pain management provider elsewhere.  Patient was seen by palliative care yesterday and recommendations were made for pain management.  I have sent the patient/daughter a MyChart message to see what the next steps would be to get these recommendations to her current pain management team.

## 2019-05-17 ENCOUNTER — Encounter: Payer: Self-pay | Admitting: Family Medicine

## 2019-05-17 ENCOUNTER — Ambulatory Visit: Payer: Medicare Other | Admitting: Family Medicine

## 2019-05-17 ENCOUNTER — Telehealth: Payer: Self-pay | Admitting: Family Medicine

## 2019-05-17 IMAGING — CR DG HIP (WITH OR WITHOUT PELVIS) 2-3V*R*
1 series · 3 of 3 positions shown · non-contrast
Comparison: Pelvis and right hip films of 06/25/2017

CLINICAL DATA: Chronic right hip pain, surgery on the right hip 1
year ago, fell 1 week ago

EXAM:
DG HIP (WITH OR WITHOUT PELVIS) 2-3V RIGHT

[Series 1: dg hip unilat w or w/o pelvis 2-3 views  · non-contrast · 0.14mm/px · 3 of 3 slices shown]
[im 1/3]
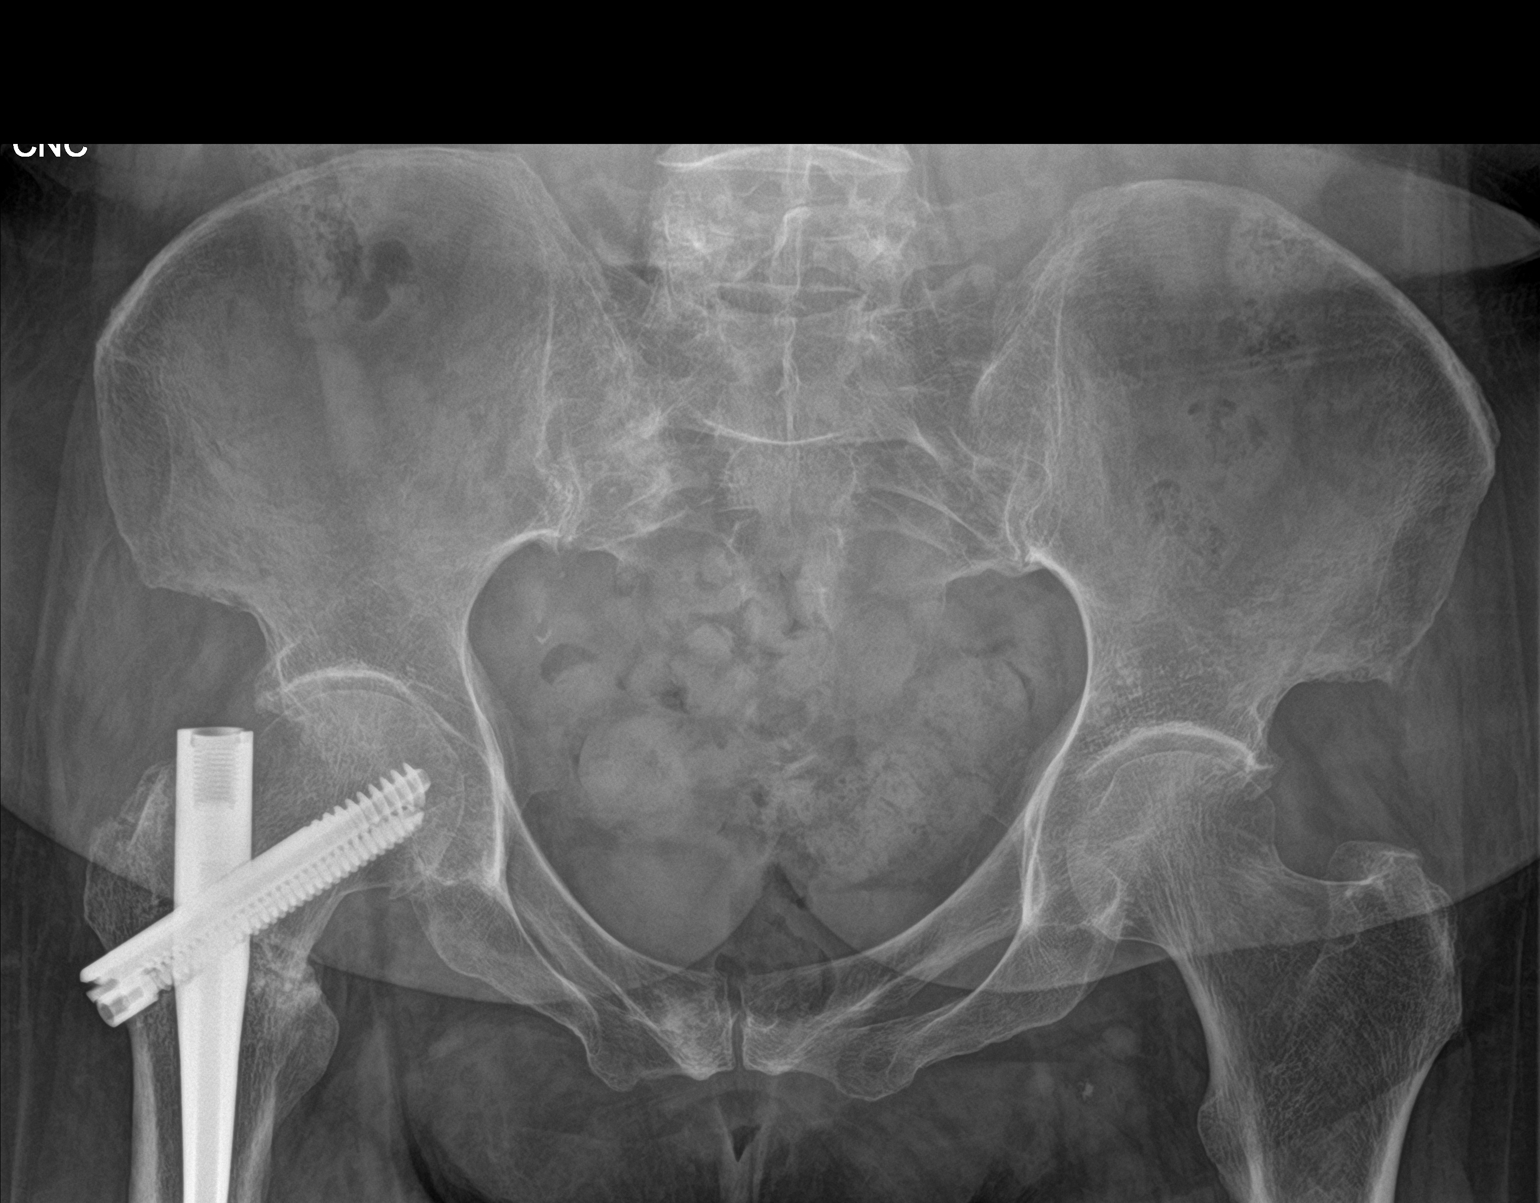
[im 2/3]
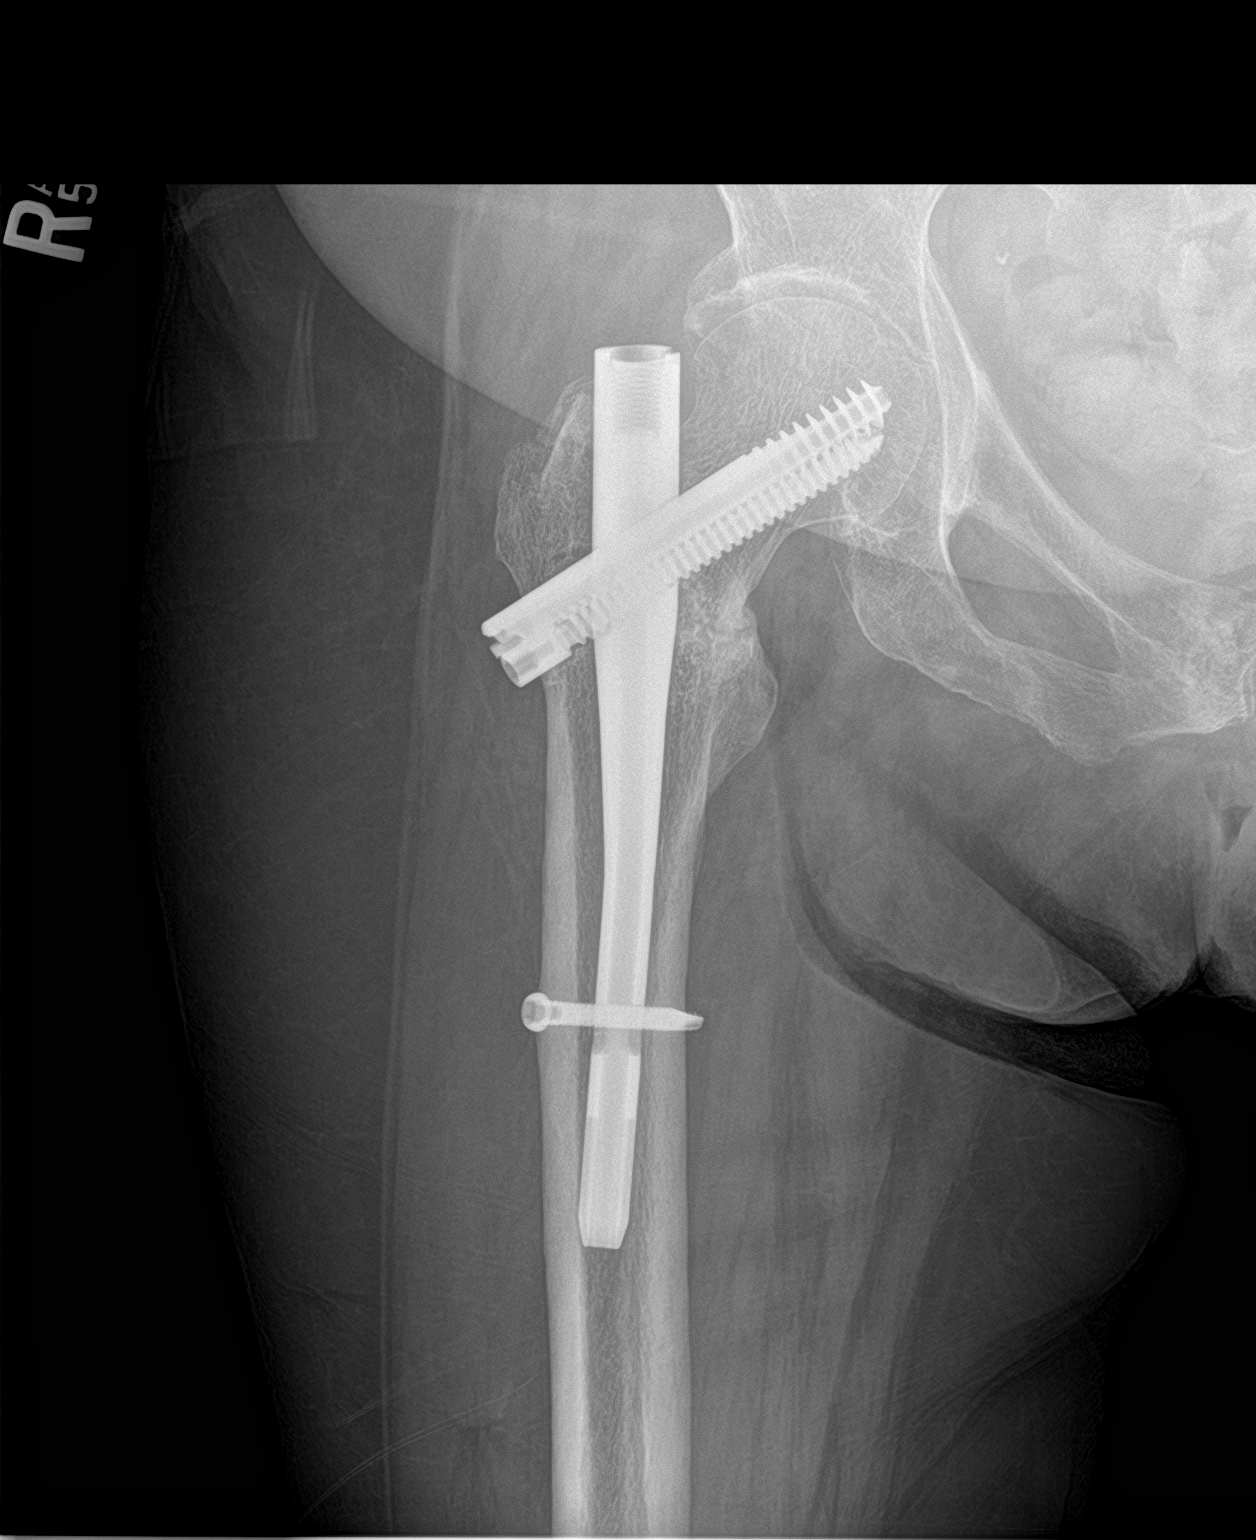
[im 3/3]
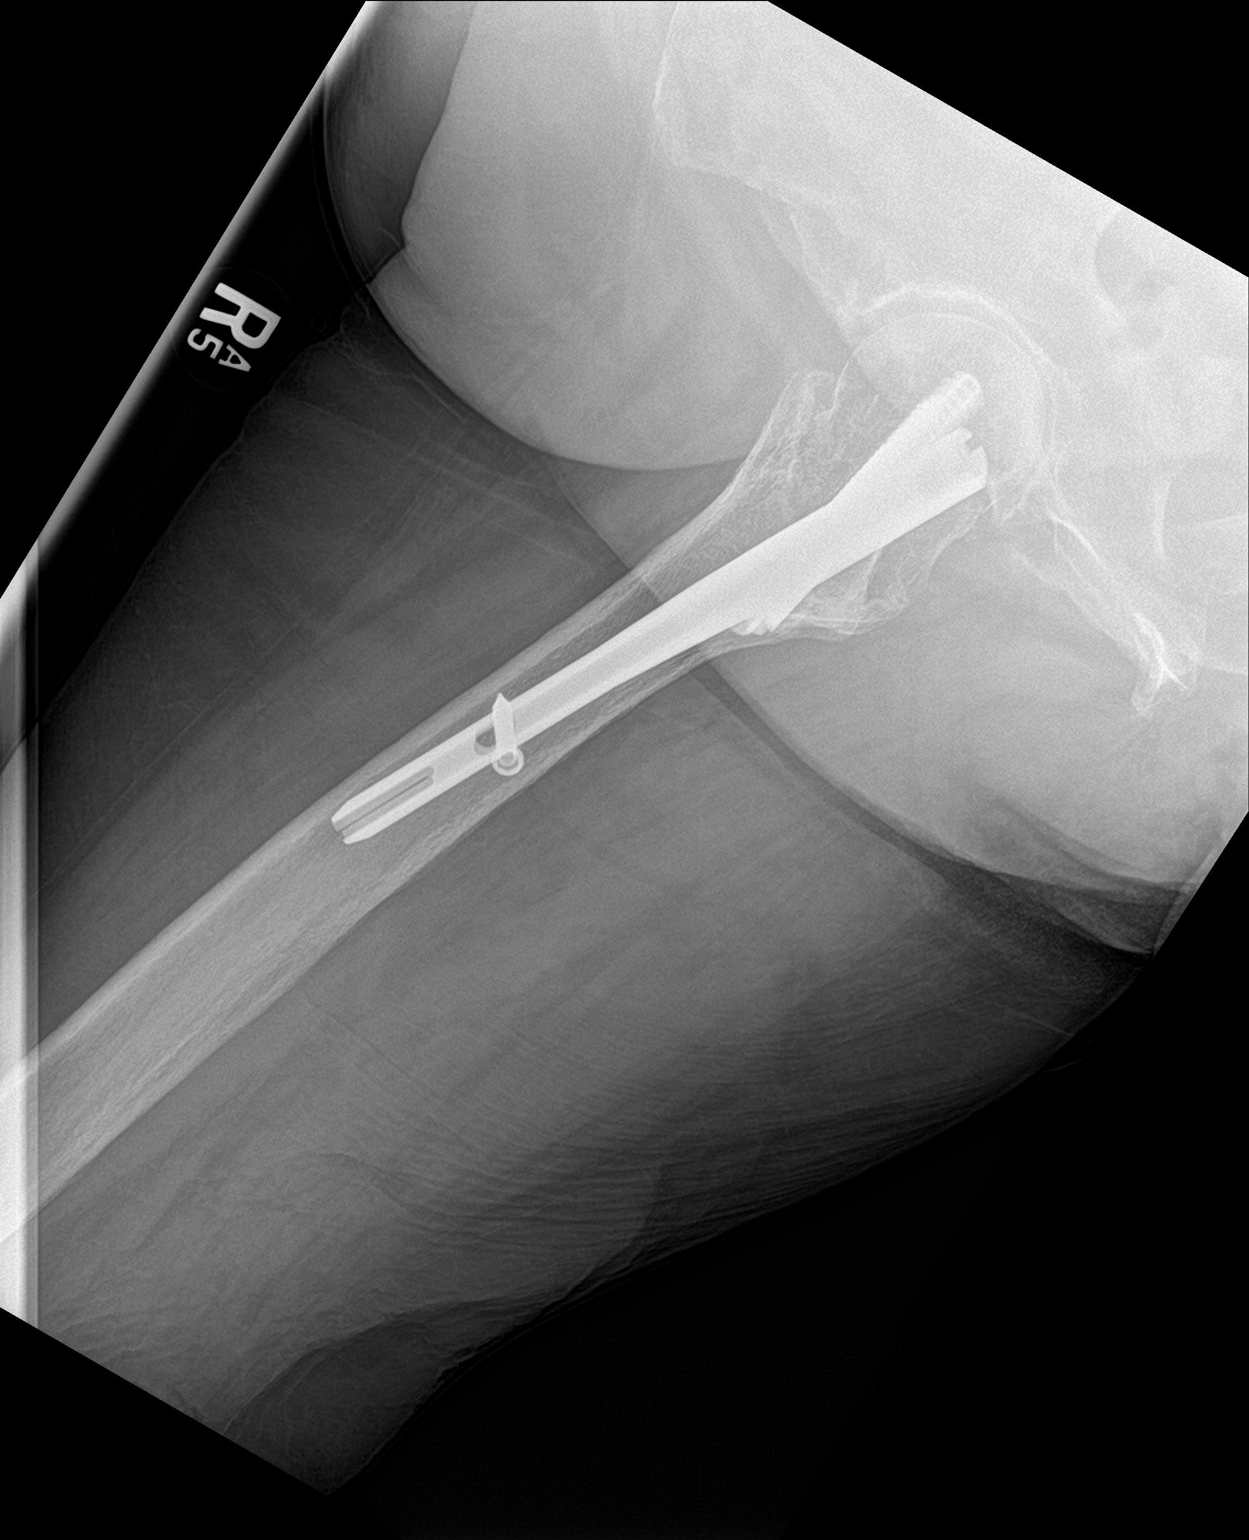

[3 of 3 positions shown; findings below may reference images not displayed]

FINDINGS: Intramedullary rod is present for fixation of prior
intertrochanteric femoral fracture. The fracture line through the
intertrochanteric femur is less evident, consistent with some
interval healing. No acute fracture is seen. The bones are diffusely
osteopenic. The pelvic rami are intact. There is some sclerosis
however noted through the right sacrum and healing fracture would be
difficult to exclude. CT of the pelvis may be helpful if further
assessment is warranted clinically.
IMPRESSION: 1. Cannot exclude subtle fracture through the right sacrum. Consider
CT of the pelvis to assess further.
2. No acute fracture of the right hip after pinning. Healing
fracture of the right intertrochanteric femur.
3. Osteopenia.

## 2019-05-17 NOTE — Telephone Encounter (Signed)
[facility] called today in regards to the patient They stated that they referred the patient over for hospice care and the providers with them are unable to follow up patient while she is in hospice. They would like to know if our provider could follow the patient.  The way the rep worded it was they would need the Physician over [name] to follow? Speaking with [name] normally they just ask for the physician over the NP just to sign the paperwork needed   Here is a contact number to the [facility] [phone]

## 2019-05-17 NOTE — Telephone Encounter (Signed)
Called and spoke to Dr. Bo Merino with [facility] and they are trying to determine patient's Hospice eligibility. I told him that palliative care had already done a consult through [facility]. He was requestiong labs- CMP, CBC, TSH, prealbumin to determine prognosis. Patient to have an appointment 05/19/19, will draw labs at that time.

## 2019-05-18 ENCOUNTER — Other Ambulatory Visit: Payer: Self-pay | Admitting: *Deleted

## 2019-05-18 ENCOUNTER — Telehealth: Payer: Self-pay

## 2019-05-18 NOTE — Telephone Encounter (Signed)
[name] with [facility] left v/m requesting verbal orders for hospice orders and certification of terminal illness and that D Carlean Purl FNP/Dr Damita Dunnings will be the attending. [name] knows that [name] has contacted the office but has to have direct orders from the office. Family is in agreement. [place] request cb.

## 2019-05-18 NOTE — Telephone Encounter (Signed)
Debra from [facility] called me directly b/c she didn't receive a call back from previous call. I told her Clarene Reamer was out of the office and would be back tomorrow. She said she thought Madelia was initiated from [facility] as we did not initiate their Referral. Please call Debra back at [facility] at [phone] to confirm her first request for orders for Hospice , certification of terminal illness and if Clarene Reamer Dr Damita Dunnings will be the attending.

## 2019-05-19 ENCOUNTER — Ambulatory Visit (INDEPENDENT_AMBULATORY_CARE_PROVIDER_SITE_OTHER): Payer: Medicare Other | Admitting: Family Medicine

## 2019-05-19 ENCOUNTER — Encounter: Payer: Self-pay | Admitting: Family Medicine

## 2019-05-19 VITALS — BP 148/88 | HR 64 | Temp 98.3°F | Ht 64.0 in | Wt 173.0 lb

## 2019-05-19 DIAGNOSIS — R1310 Dysphagia, unspecified: Secondary | ICD-10-CM

## 2019-05-19 DIAGNOSIS — M79604 Pain in right leg: Secondary | ICD-10-CM

## 2019-05-19 DIAGNOSIS — G8929 Other chronic pain: Secondary | ICD-10-CM

## 2019-05-19 DIAGNOSIS — T402X5A Adverse effect of other opioids, initial encounter: Secondary | ICD-10-CM

## 2019-05-19 DIAGNOSIS — R1110 Vomiting, unspecified: Secondary | ICD-10-CM | POA: Insufficient documentation

## 2019-05-19 DIAGNOSIS — K5903 Drug induced constipation: Secondary | ICD-10-CM

## 2019-05-19 DIAGNOSIS — R112 Nausea with vomiting, unspecified: Secondary | ICD-10-CM | POA: Diagnosis not present

## 2019-05-19 DIAGNOSIS — N3090 Cystitis, unspecified without hematuria: Secondary | ICD-10-CM | POA: Diagnosis not present

## 2019-05-19 MED ORDER — PROMETHAZINE HCL 12.5 MG PO TABS: 12.5000 mg | ORAL_TABLET | Freq: Two times a day (BID) | ORAL | 0 refills | Status: DC | PRN

## 2019-05-19 MED ORDER — CIPROFLOXACIN HCL 250 MG PO TABS: 250.0000 mg | ORAL_TABLET | Freq: Two times a day (BID) | ORAL | 0 refills | Status: AC

## 2019-05-19 NOTE — Telephone Encounter (Signed)
Called and spoke with Hilda Blades at [facility]. Per patient and daughter's wishes, will continue to have Palliative care services through [facility] until further notice. They do not wish to go under [facility] provider at this time.

## 2019-05-19 NOTE — Progress Notes (Signed)
Virtual Visit via Video Note  I connected with [name] on 05/19/19 at 12:00 PM EST by a video enabled telemedicine application and verified that I am speaking with the correct person using two identifiers.  Location: Patient: In her home Provider: [place] Persons participating in call: patient, her [name] and provider   I discussed the limitations of evaluation and management by telemedicine and the availability of in person appointments. The patient expressed understanding and agreed to proceed.  History of Present Illness: Chief Complaint  Patient presents with  . Trouble Swallowing    Feels like food gets stuck  . Cough    Dry  This is a 78 yo female who presents today for virtual visit to discuss above symptoms. She has been scheduled several times for in person visits but has repeatedly rescheduled.   Dysphagia- This has been ongoing for several months. She reports intermittent sensation of food getting "stuck," and subsequent vomiting. She has frequent nausea and vomiting. No relief with ondansetron.Some relief with promethazine but does not like to take rectal medication. Decreased appetite for months. She had upper endoscopy 10/30/18 while hospitalized. It showed normal esophagus and GE junction. The patient was referred to gastroenterology 10/20. Unfortunately, did not keep appointment. She was able to eat an egg sandwich yesterday.   UTI- has had frequent UTI, reports ongoing symptoms of frequency, dysuria. She has been unable to provide urine sample to office. Chronic constipation. Currently on LInzess 290 mg daily and 1 dose of Miralax daily. 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   Chronic pain- has been following with pain management but has missed recent appointments. Has been seen by palliative care who had recommendations for improved pain control. Not sure if those  were communicated to pain management team.   Past Medical History:  Diagnosis Date  . Acute postoperative pain 02/03/2018  . Anemia   . B12 deficiency   . Bacteremia 10/07/2014  . Bladder incontinence   . Blind left eye   . Cancer ([place])    skin cancer   . Cataract   . Cervical spine fracture ([place])   . Chest pain 07/29/2013  . Compression fracture    C7,  upper T spine -compression fx  . COPD (chronic obstructive pulmonary disease) ([place])   . DDD (degenerative disc disease), lumbar   . Depression    major  . Diffuse myofascial pain syndrome 03/24/2015  . Dyspnea    at times- when activty  . Fever 10/05/2014  . GERD (gastroesophageal reflux disease)   . Hiatal hernia   . Hyperlipidemia   . Hypertension   . 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 Macular degeneration   . On home oxygen therapy    "3L; all the time" (04/30/2018)  . Osteoarthritis   . Osteoporosis   . Pneumonia    years ago  . Pulmonary embolism ([place]) 04/30/2018  . Reactive airway disease   . Rupture of bowel ([place])   . Sepsis ([place]) 10/08/2014  . Stroke [facility])    "mini stroke"- balance and memory issue  . Stroke ([place])   . Wrist pain, acute 09/10/2012   Past Surgical History:  Procedure Laterality Date  . ABDOMINAL HYSTERECTOMY    . ABDOMINAL SURGERY    . APPENDECTOMY    . BLADDER SURGERY    . CATARACT EXTRACTION W/ INTRAOCULAR LENS  IMPLANT, BILATERAL Bilateral   .  CHOLECYSTECTOMY    . COLON SURGERY    . COLOSTOMY REVERSAL    . ESOPHAGOGASTRODUODENOSCOPY N/A 10/30/2018   Procedure: ESOPHAGOGASTRODUODENOSCOPY (EGD);  Surgeon: Lin Landsman, MD;  Location: [facility] ENDOSCOPY;  Service: Gastroenterology;  Laterality: N/A;  . INTRAMEDULLARY (IM) NAIL INTERTROCHANTERIC Right 09/13/2016   Procedure: INTRAMEDULLARY (IM) NAIL INTERTROCHANTRIC RIGHT;  Surgeon: Leandrew Koyanagi, MD;  Location: [place];  Service: Orthopedics;  Laterality: Right;  . JOINT REPLACEMENT Bilateral   . KNEE SURGERY    . OSTOMY    .  RECTOCELE REPAIR    . TOE SURGERY Right    3rd  . TOTAL HIP ARTHROPLASTY Right 09/30/2017   Procedure: RIGHT HIP IM NAIL REMOVAL WITH CONVERSION TO TOTAL HIP ARTHOPLASTY, anterior approach;  Surgeon: Renette Butters, MD;  Location: [place];  Service: Orthopedics;  Laterality: Right;   Family History  Problem Relation Age of Onset  . Heart failure Mother   . Heart attack Father   . Lung cancer Sister   . Deep vein thrombosis Sister   . Pulmonary embolism Son   . Deep vein thrombosis Son   . Cervical cancer Daughter    Social History   Tobacco Use  . Smoking status: Former Smoker    Packs/day: 1.50    Years: 30.00    Pack years: 45.00    Types: E-cigarettes, Cigarettes    Quit date: 2013    Years since quitting: 7.9  . Smokeless tobacco: Never Used  Substance Use Topics  . Alcohol use: No    Alcohol/week: 0.0 standard drinks  . Drug use: No      Observations/Objective: The patient is alert, answers questions appropriately. Speaks in complete sentences with unlabored respirations. Visible skin unremarkable.  BP (!) 148/88   Pulse 64   Temp 98.3 F (36.8 C) (Oral)   Ht 5\' 4"  (1.626 m)   Wt 173 lb (78.5 kg)   SpO2 96%   BMI 29.70 kg/m  Wt Readings from Last 3 Encounters:  05/19/19 173 lb (78.5 kg)  04/19/19 167 lb 6.4 oz (75.9 kg)  03/08/19 178 lb (80.7 kg)     Assessment and Plan: 1. Cystitis - will treat based on symptoms as she has been unable to get to the office. Follow up urine culture after completion of antibiotic.  - Discussed importance of adequate fluid hydration.  - ciprofloxacin (CIPRO) 250 MG tablet; Take 1 tablet (250 mg total) by mouth 2 (two) times daily for 5 days.  Dispense: 10 tablet; Refill: 0  2. Dysphagia, unspecified type - encouraged her to thoroughly chew her food, eat soft, high calorie foods.  - DG Swallowing Func-Speech Pathology; Future  3. Non-intractable vomiting with nausea, unspecified vomiting type - promethazine (PHENERGAN)  12.5 MG tablet; Take 1 tablet (12.5 mg total) by mouth every 12 (twelve) hours as needed for nausea or vomiting.  Dispense: 20 tablet; Refill: 0  4. Chronic lower extremity pain (Right) - she has upcoming appointment with pain management, I have contacted palliative care NP to see if recommendations were communicated to pain management.  - follow up with ortho at [facility] as scheduled  5. Opioid-induced constipation (OIC) - encouraged increased use of Miralax    Clarene Reamer, FNP-BC  [facility]  05/19/2019 9:59 PM   Follow Up Instructions:    I discussed the assessment and treatment plan with the patient. The patient was provided an opportunity to ask questions and all were  answered. The patient agreed with the plan and demonstrated an understanding of the instructions.   The patient was advised to call back or seek an in-person evaluation if the symptoms worsen or if the condition fails to improve as anticipated.   [name], FNP

## 2019-05-25 ENCOUNTER — Other Ambulatory Visit: Payer: Self-pay

## 2019-05-25 ENCOUNTER — Other Ambulatory Visit: Payer: Medicare Other | Admitting: Adult Health Nurse Practitioner

## 2019-05-25 ENCOUNTER — Telehealth: Payer: Self-pay | Admitting: Adult Health Nurse Practitioner

## 2019-05-25 ENCOUNTER — Telehealth: Payer: Self-pay

## 2019-05-25 DIAGNOSIS — S72001S Fracture of unspecified part of neck of right femur, sequela: Secondary | ICD-10-CM

## 2019-05-25 DIAGNOSIS — Z515 Encounter for palliative care: Secondary | ICD-10-CM

## 2019-05-25 NOTE — Telephone Encounter (Signed)
At the direction of Amy NP, phone call placed to [facility] to request patient's appointment be moved up due to patient continuing to have pain. Received instruction that patient/family will need to call. Amy NP updated. Palliative care consult note faxed to [facility].

## 2019-05-25 NOTE — Progress Notes (Signed)
Therapist, nutritional Palliative Care Consult Note Telephone: [phone]  Fax: [phone]  PATIENT NAME: [name] DOB: 06/10/1940 MRN: [id number]  PRIMARY CARE PROVIDER:   Emi Belfast, FNP  REFERRING PROVIDER:  Emi Belfast, FNP [address] [place],  [place] [address]  RESPONSIBLE PARTY:   Janace Hoard, daughter C:  [phone] H: [phone]    RECOMMENDATIONS and PLAN:  1.  Advanced care planning.  Patient is DNR from documentation in Epic.  They had just received the Hard Choices book yesterday and have not had time to review it. Have appointment in 4 weeks to go over further.  2.  Pain.  No adjustments had been made to her pain regimen and she continues to have ongoing pain with weight bearing on right hip which keeps her from getting up and doing much walking.  Does state that with sitting the pain is still there but it goes down to a 5/10.  Feels like at times that she would rather have her leg removed than to continue with the pain and decreased quality of life the pain leads to.  She was running slight temperature (denies any fever, SOB, cough today) on day of scheduled visit and had it rescheduled for 06/17/2019. Below are recommendations listed from note on 05/11/2019 Some recommendations that can be tried:      Fentanyl patch starting at lowest dose and titrating up as needed.  Would leave the oxycodone at lower dose for breakthrough pain      Amytriptylene may be a possibility but in light of the nortriptylene causing sedation, this may cause sedation as well.      Increasing the Lyrica 100 mg from BID to TID      Adding something more during the day for anxiety/depression such as zoloft or adding a daytime dose of xanax to see if the anxiety/depression is causing increased pain.   Would only try one thing at a time to monitor for effectiveness.  Patient and daughter understand that the pain will not be completely eliminated but  would like the pain reduced so patient can start doing and enjoying some of the things she used to do and enjoy.    3. Depression.  Patient had cymbalta increased to 40 mg daily 2 weeks ago.  She states that she does not notice a difference.  The daughter states that she has noticed that her mother does not cry as much with the increase.  she does state that with the pain that she feels like not living anymore.  She denies any thoughts of harming herself.   Continue to monitor for effectiveness and make changes as needed  4.  Appetite.  Daughter states that she does spit up after eating most anything.  Has recently been switched to phenergan tablet from zofran.  She does have hiatal hernia that causes problems with reflux.  She is being followed by GI and has upcoming swallow test scheduled to check to see if she there is any other underlying cause.  Continue follow up and recommendations by GI  Have appointment in 4 weeks to reevaluate pain with any changes made based on recommendations.  Palliative care will continue to monitor for symptom management and decline and make recommendations as needed.  I spent 60 minutes providing this consultation,  from 10:00 to 11:00. More than 50% of the time in this consultation was spent coordinating communication.   HISTORY OF PRESENT ILLNESS:  Nicole Situ  [name] is a 78 y.o. year old female with multiple medical problems including chronic right hip pain, malignant hypertensive kidney disease, COPD, HTN, OA, h/o CVA with balance and memory issues, h/o pulmonary embolism on eliquis. Palliative Care was asked to help address goals of care.   CODE STATUS:   PPS: 0% HOSPICE ELIGIBILITY/DIAGNOSIS: TBD  PHYSICAL EXAM:   General: NAD, frail appearing Extremities: has edema noted to right knee with tenderness with palpation; trace edema to bilateral feet with pedal pulses felt Skin: no rashes Neurological: Weakness but otherwise nonfocal   PAST MEDICAL HISTORY:    Past Medical History:  Diagnosis Date  . Acute postoperative pain 02/03/2018  . Anemia   . B12 deficiency   . Bacteremia 10/07/2014  . Bladder incontinence   . Blind left eye   . Cancer (HCC)    skin cancer   . Cataract   . Cervical spine fracture (HCC)   . Chest pain 07/29/2013  . Compression fracture    C7,  upper T spine -compression fx  . COPD (chronic obstructive pulmonary disease) (HCC)   . DDD (degenerative disc disease), lumbar   . Depression    major  . Diffuse myofascial pain syndrome 03/24/2015  . Dyspnea    at times- when activty  . Fever 10/05/2014  . GERD (gastroesophageal reflux disease)   . Hiatal hernia   . Hyperlipidemia   . Hypertension   . 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 Macular degeneration   . On home oxygen therapy    "3L; all the time" (04/30/2018)  . Osteoarthritis   . Osteoporosis   . Pneumonia    years ago  . Pulmonary embolism (HCC) 04/30/2018  . Reactive airway disease   . Rupture of bowel (HCC)   . Sepsis (HCC) 10/08/2014  . Stroke [facility])    "mini stroke"- balance and memory issue  . Stroke (HCC)   . Wrist pain, acute 09/10/2012    SOCIAL HX:  Social History   Tobacco Use  . Smoking status: Former Smoker    Packs/day: 1.50    Years: 30.00    Pack years: 45.00    Types: E-cigarettes, Cigarettes    Quit date: 2013    Years since quitting: 7.9  . Smokeless tobacco: Never Used  Substance Use Topics  . Alcohol use: No    Alcohol/week: 0.0 standard drinks    ALLERGIES:  Allergies  Allergen Reactions  . Ampicillin Anaphylaxis, Nausea Only and Swelling    SEVERE HEADACHE MUSCLE CRAMPS ANGIOEDEMA THRUSH PATIENT HAS HAD A PCN REACTION WITH IMMEDIATE RASH, FACIAL/TONGUE/THROAT SWELLING, SOB, OR LIGHTHEADEDNESS WITH HYPOTENSION:  #  #  YES  #  #  Has patient had a PCN reaction causing severe rash involving mucus membranes or skin necrosis: No Has patient had a PCN reaction that required hospitalization:No Has  patient had a PCN reaction occurring within the last 10 years: No.  . Ampicillin-Sulbactam Sodium Anaphylaxis and Other (See Comments)    SEVERE HEADACHE MUSCLE CRAMPS ANGIOEDEMA THRUSH PATIENT HAS HAD A PCN REACTION WITH IMMEDIATE RASH, FACIAL/TONGUE/THROAT SWELLING, SOB, OR LIGHTHEADEDNESS WITH HYPOTENSION: # # YES # # Has patient had a PCN reaction causing severe rash involving mucus membranes or skin necrosis: No Has patient had a PCN reaction that required hospitalization:No Has patient had a PCN reaction occurring within the last 10 years: No   . Ambien [Zolpidem Tartrate] Nausea And Vomiting and Other (See Comments)    HALLUCINATIONS SWEATING  . Flexeril [Cyclobenzaprine] Other (  See Comments)    EXTRAPYRAMIDAL MOVEMENT INVOLUNTARY MUSCLE JERKING  . Tape Itching, Rash and Other (See Comments)    Paper tape only. Adhesive tape=itching/burning/rash  . Toradol [Ketorolac Tromethamine] Nausea Only and Other (See Comments)    HEADACHE  BACKACHE  . Sulfamethoxazole-Trimethoprim     UNSPECIFIED REACTION   . Zolpidem     Hallucinations  . Cephalexin Rash and Other (See Comments)    HEADACHES  . Tramadol Rash and Other (See Comments)    HEADACHE      PERTINENT MEDICATIONS:  Outpatient Encounter Medications as of 05/25/2019  Medication Sig  . acetaminophen (TYLENOL) 500 MG tablet Take 2 tablets (1,000 mg total) by mouth 3 (three) times daily. (Patient taking differently: Take 1,000 mg by mouth 2 (two) times daily. )  . albuterol (ACCUNEB) 1.25 MG/3ML nebulizer solution Take 3 mLs (1.25 mg total) by nebulization every 6 (six) hours as needed for wheezing.  Marland Kitchen albuterol (VENTOLIN HFA) 108 (90 Base) MCG/ACT inhaler Inhale 2 puffs into the lungs every 6 (six) hours as needed for wheezing or shortness of breath.  Marland Kitchen alendronate (FOSAMAX) 70 MG tablet TAKE 1 TABLET BY MOUTH ONCE A WEEK  . ALPRAZolam (XANAX) 1 MG tablet Take 1 tablet (1 mg total) by mouth at bedtime as needed for anxiety.    Marland Kitchen amLODipine (NORVASC) 5 MG tablet TAKE 1 TABLET BY MOUTH EVERY DAY  . apixaban (ELIQUIS) 2.5 MG TABS tablet Take 1 tablet (2.5 mg total) by mouth 2 (two) times daily.  Marland Kitchen docusate sodium (COLACE) 100 MG capsule Take 200 mg by mouth 2 (two) times daily as needed for moderate constipation.   . DULoxetine (CYMBALTA) 20 MG capsule Take 2 capsules (40 mg total) by mouth daily.  Marland Kitchen esomeprazole (NEXIUM) 40 MG capsule Take 1 capsule (40 mg total) by mouth daily.  . fluticasone (FLONASE) 50 MCG/ACT nasal spray Place 2 sprays into both nostrils daily.  . fluticasone (FLOVENT HFA) 220 MCG/ACT inhaler Inhale 1 puff into the lungs 2 (two) times daily.  Marland Kitchen LINZESS 290 MCG CAPS capsule TAKE 1 CAPSULE BY MOUTH EVERY DAY  . lovastatin (MEVACOR) 10 MG tablet Take 1 tablet (10 mg total) by mouth daily.  Marland Kitchen NARCAN 4 MG/0.1ML LIQD nasal spray kit   . ondansetron (ZOFRAN ODT) 4 MG disintegrating tablet Take 1 tablet (4 mg total) by mouth every 8 (eight) hours as needed for nausea or vomiting.  . Oxycodone HCl 10 MG TABS Take 10 mg by mouth 3 (three) times daily.  . polyethylene glycol (MIRALAX / GLYCOLAX) packet Take 17 g by mouth daily as needed (for constipation.).   Marland Kitchen pregabalin (LYRICA) 100 MG capsule TAKE 1 CAPSULE BY MOUTH TWICE A DAY  . promethazine (PHENERGAN) 12.5 MG tablet Take 1 tablet (12.5 mg total) by mouth every 12 (twelve) hours as needed for nausea or vomiting.  . traZODone (DESYREL) 50 MG tablet TAKE 0.5-1 TABLETS (25-50 MG TOTAL) BY MOUTH AT BEDTIME AS NEEDED FOR SLEEP.   No facility-administered encounter medications on file as of 05/25/2019.     Cayci Mcnabb Marlena Clipper, NP

## 2019-05-25 NOTE — Telephone Encounter (Signed)
Spoke with daughter about possibility of getting appointment moved up with pain clinic.  Was told from pain clinic that the patient needed to call to make any changes to appointment.  Let the daughter know this and she indicated that she will try to call tomorrow. Staphanie Harbison K. Olena Heckle NP

## 2019-05-29 IMAGING — CT CT ABD-PELV W/ CM
2 of 5 series · 15 of 46 positions shown, 17 images · IV contrast (APPLIED)
Comparison: CT stone study 10/23/2016

CLINICAL DATA: Abdominal pain, acute, generalized. Nausea and
vomiting that began yesterday.

EXAM:
CT ABDOMEN AND PELVIS WITH CONTRAST
TECHNIQUE: Multidetector CT imaging of the abdomen and pelvis was performed
using the standard protocol following bolus administration of
intravenous contrast.
CONTRAST:  80mL [id number] IOPAMIDOL ([id number]) INJECTION 61%

[Series 5: abdomen 3.0 mpr cor · coronal · 0.63mm/px · 3 of 101 slices shown]
[im 34/101  soft-tissue]
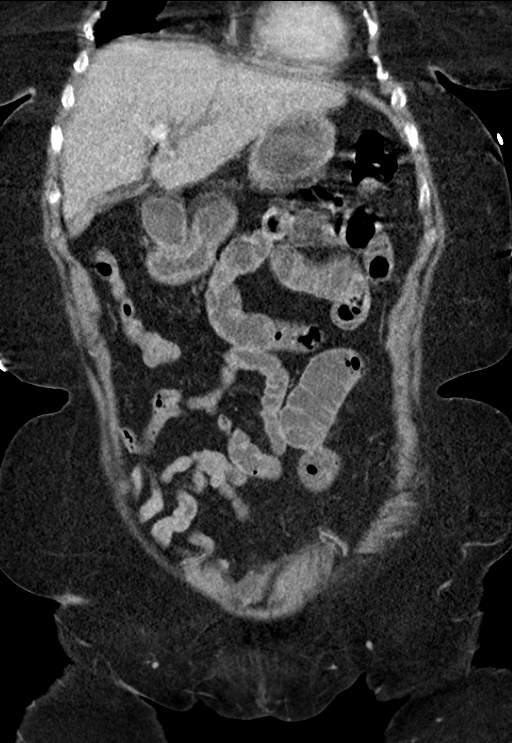
[im 45/101  soft-tissue]
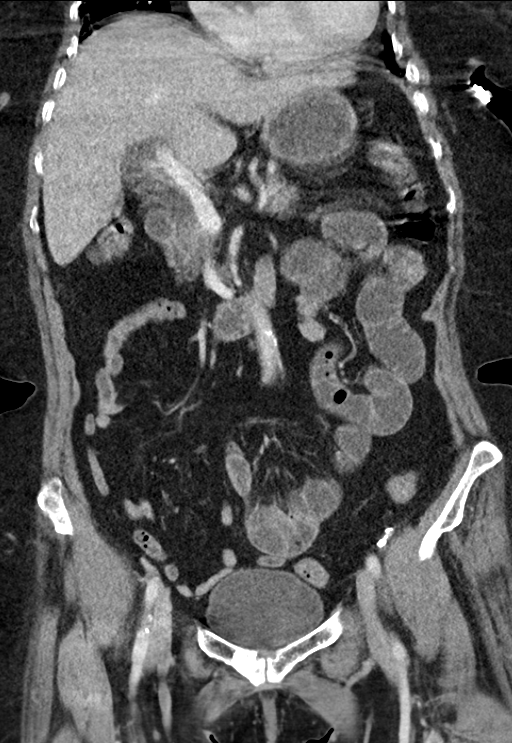
[im 56/101  soft-tissue]
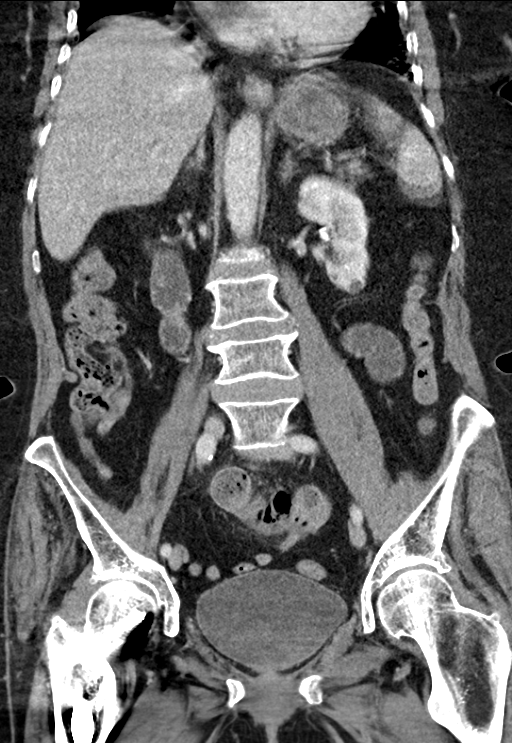

[Series 8: abdomen 5.0 · axial · 0.70mm/px · z∈[+806,+1216]mm · 12 of 94 slices shown, 14 images]
[im 6/94  soft-tissue]
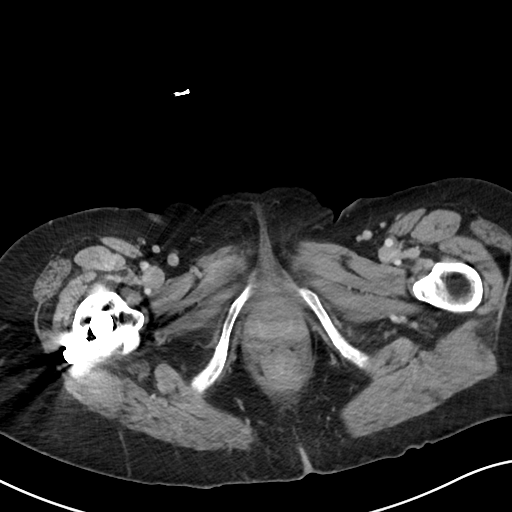
[im 6/94  bone]
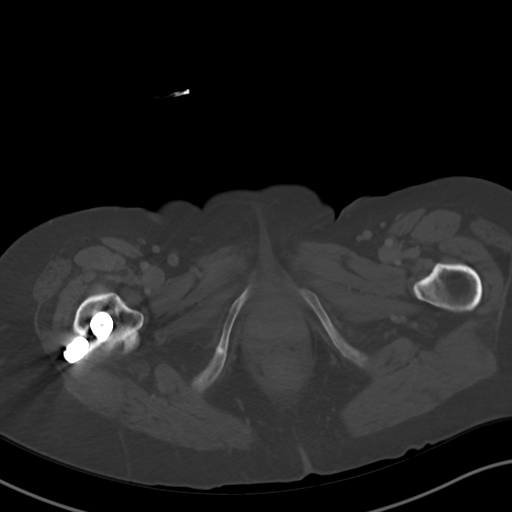
[im 12/94  soft-tissue]
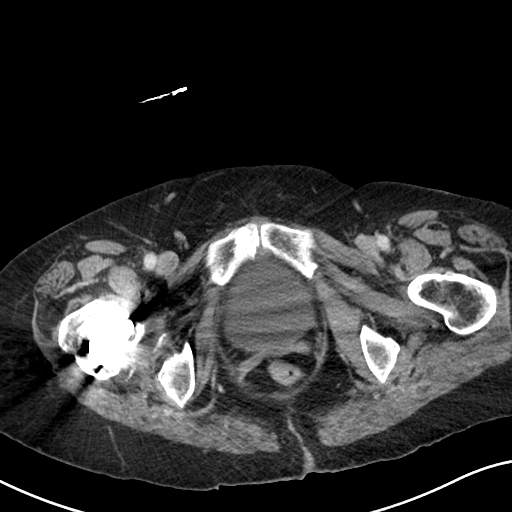
[im 24/94  soft-tissue]
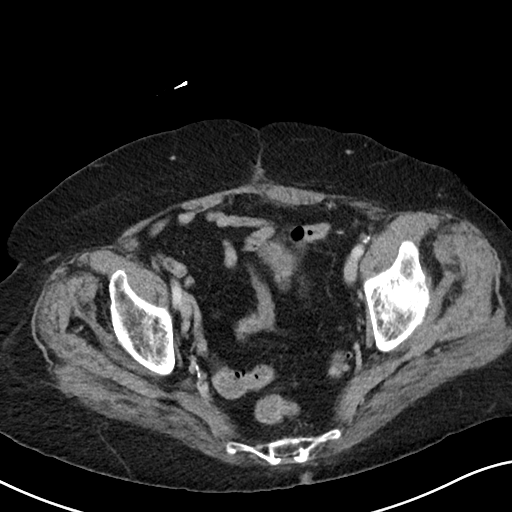
[im 30/94  soft-tissue]
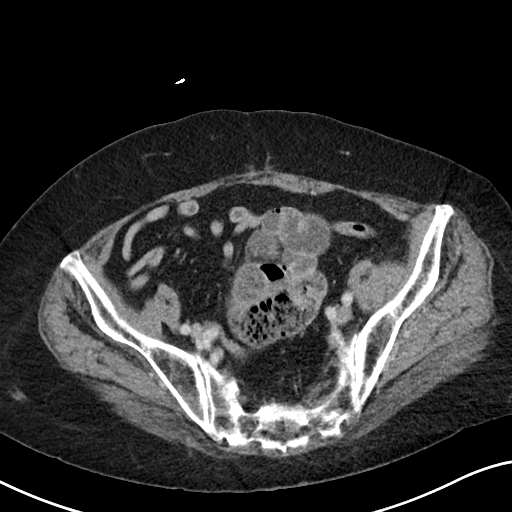
[im 35/94  soft-tissue]
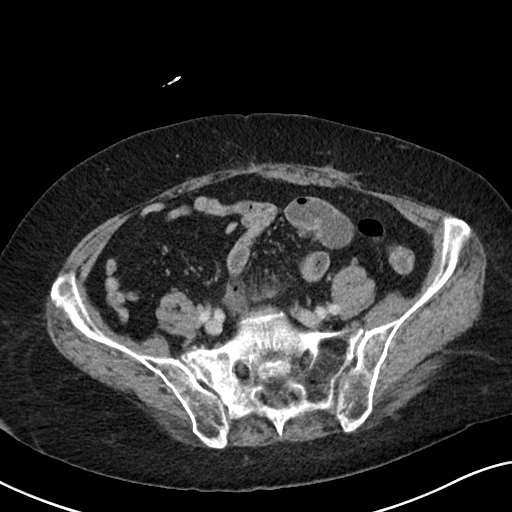
[im 41/94  soft-tissue]
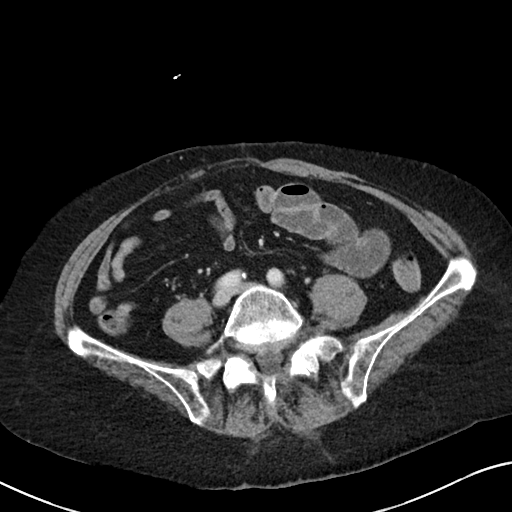
[im 53/94  soft-tissue]
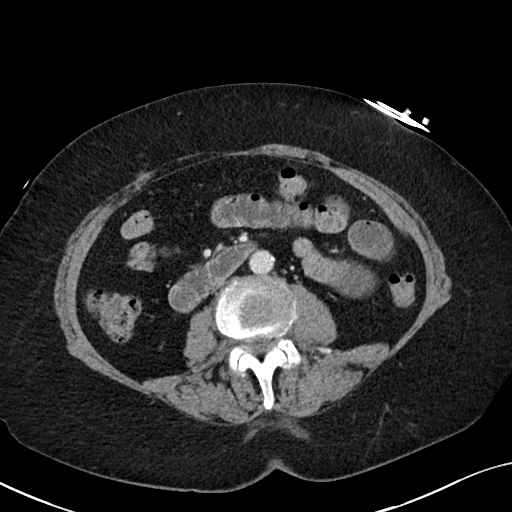
[im 59/94  soft-tissue]
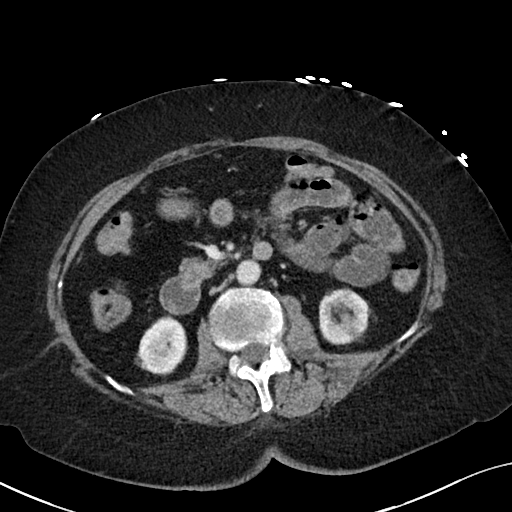
[im 64/94  soft-tissue]
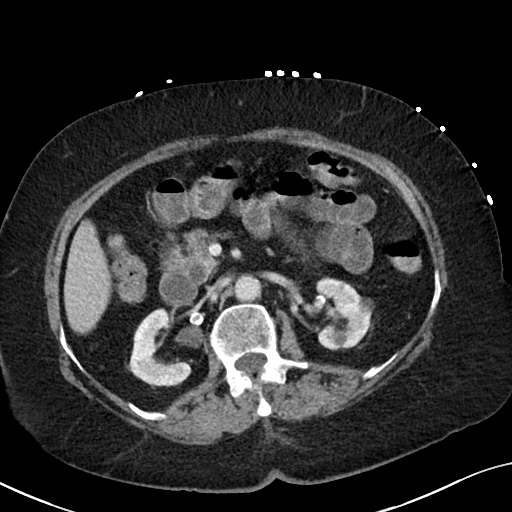
[im 64/94  bone]
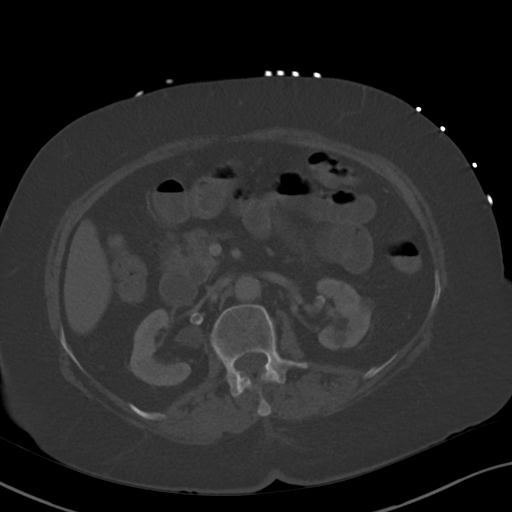
[im 70/94  soft-tissue]
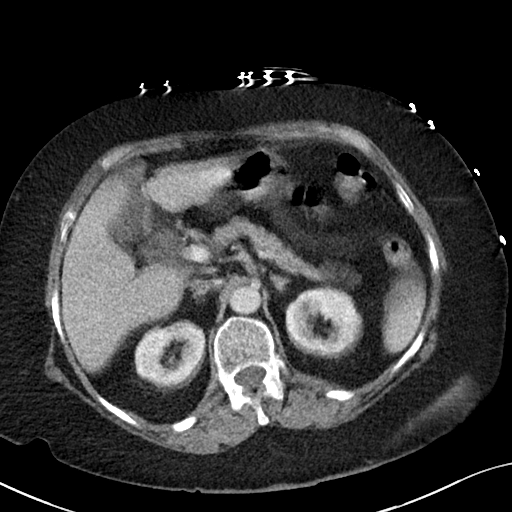
[im 82/94  soft-tissue]
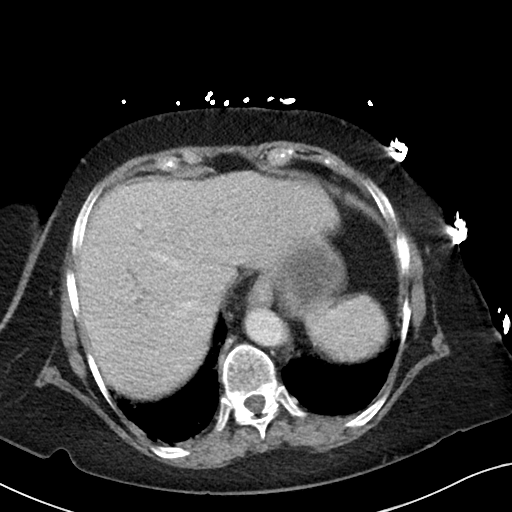
[im 88/94  soft-tissue]
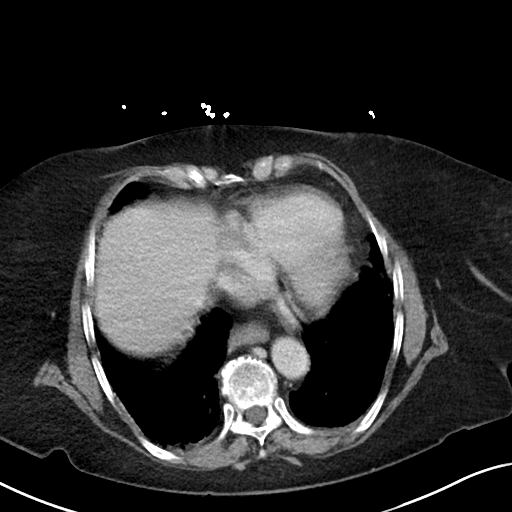

[15 of 46 positions shown; findings below may reference images not displayed]

FINDINGS: Lower chest: Cardiomegaly. Mild atelectatic type change. No acute
finding.

Hepatobiliary: Cholecystectomy with common bile duct dilatation. The
common bile duct measures up to 13 mm at the pancreatic head,
chronic when compared to prior. No calcified
choledocholithiasis.Negative liver.

Pancreas: Generalized atrophy.

Spleen: Granulomatous type calcifications

Adrenals/Urinary Tract: Negative adrenals. Symmetric renal atrophy
with bilateral cortical cystic densities. Heavily calcified 5 mm
right renal artery aneurysm and 7 mm left renal artery aneurysm. No
hydronephrosis. Unremarkable bladder.

Stomach/Bowel: There are distended fluid-filled loops of small bowel
with transition to decompressed ileum. Near the transition is a loop
of small bowel containing fecalized contents. These are findings of
early/partial small bowel obstruction. No evidence of bowel necrosis
perforation. No visible underlying mass. Colon is primarily
decompressed. Sigmoid diverticulosis. No pericecal inflammation.
Patient has history of appendectomy.

Vascular/Lymphatic: Atherosclerotic calcification. No acute vascular
finding. No mass or adenopathy.

Reproductive:Hysterectomy

Other: No ascites or pneumoperitoneum.

Musculoskeletal: There is a band of sclerosis and cortical
irregularity across the right sacral ala, sacral insufficiency
fracture with subacute features. Intertrochanteric right femur
fracture status post fixation. Right inferior pubic ramus and right
puboacetabular junction fractures with band of sclerosis.
IMPRESSION: 1. Partial/early small bowel obstruction, likely from adhesive
change.
2. Subacute right sacral and obturator ring insufficiency fractures.
History of fall and right-sided pain early June 2017.
3.  Aortic Atherosclerosis ([id number]).
4. Small bilateral renal artery aneurysms measuring up to 7 mm on
the left.

## 2019-05-29 IMAGING — DX DG ABD PORTABLE 1V
1 series · 1 of 1 positions shown · non-contrast
Comparison: CT from [DATE]

CLINICAL DATA: 8 hour delay for small bowel obstruction.

EXAM:
PORTABLE ABDOMEN - 1 VIEW

[abdomen kub]
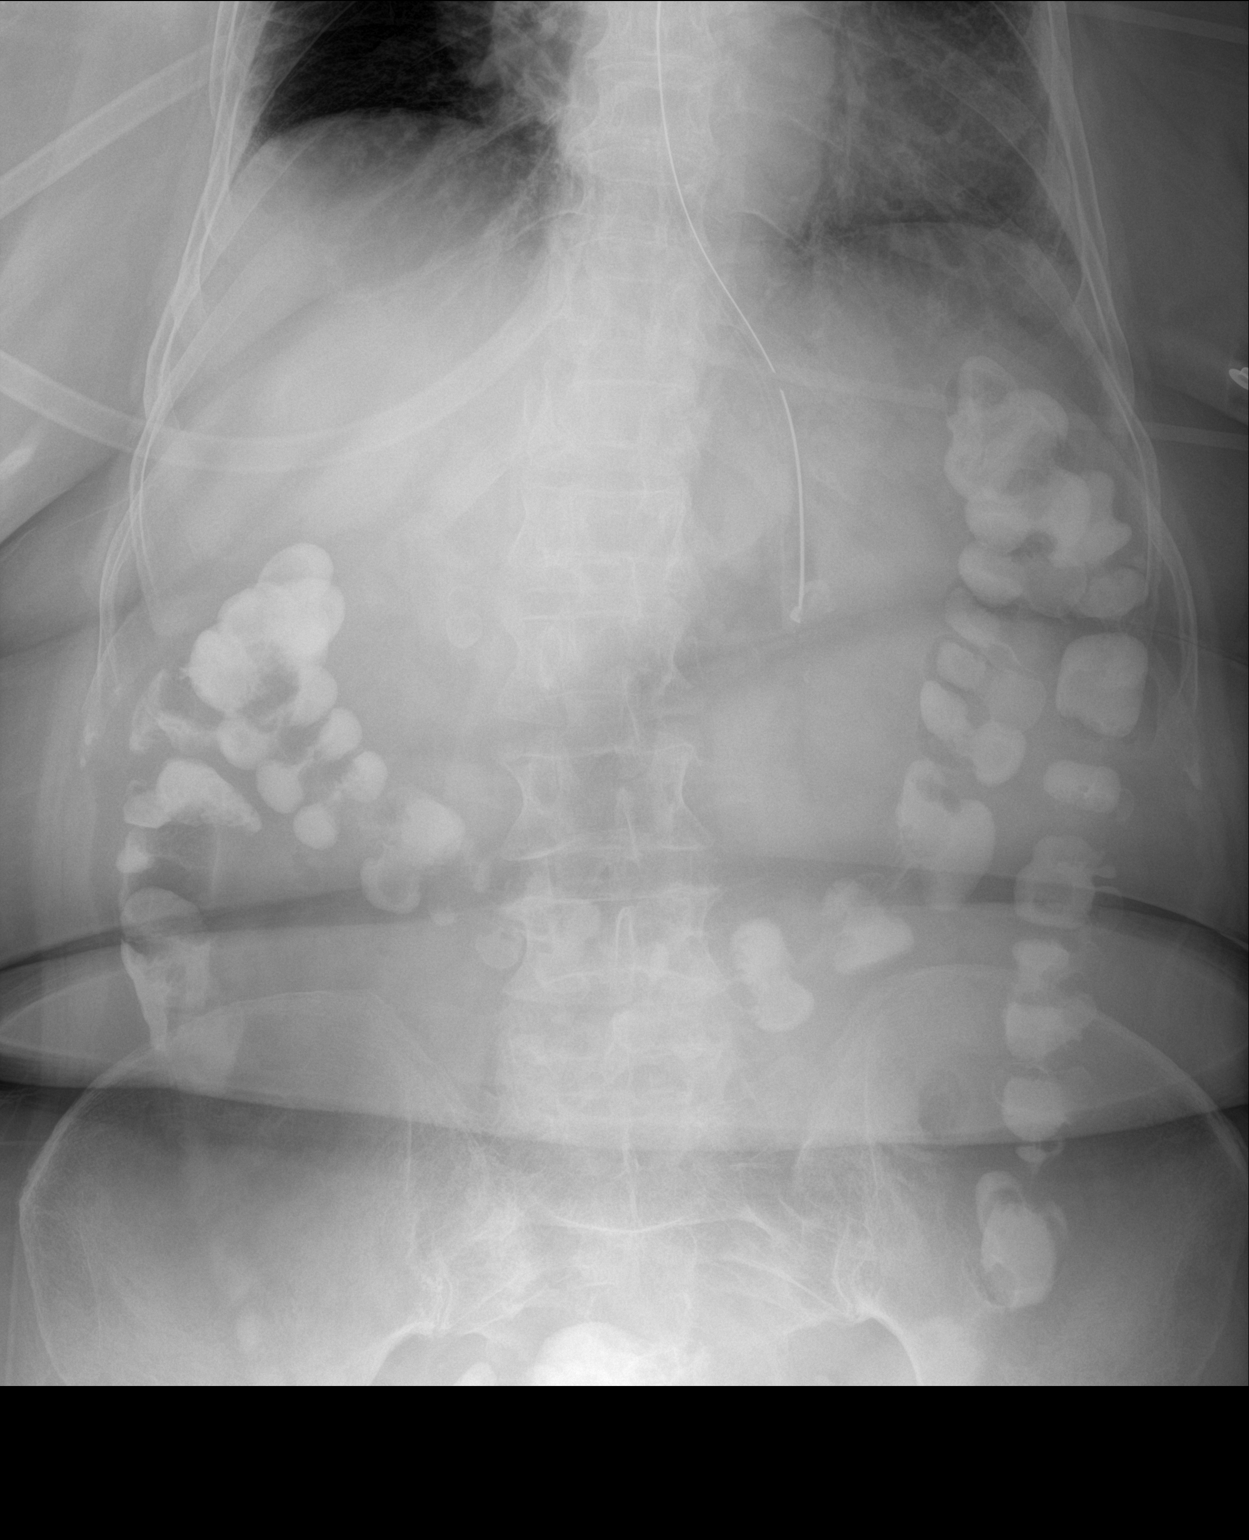

[1 of 1 positions shown; findings below may reference images not displayed]

FINDINGS: Oral contrast is noted within the colon from cecum to rectum. No
significant small bowel dilatation. Gastric tube is in the expected
location of the stomach. Rim-like calcifications are noted in the
upper abdomen bilaterally compatible with documented renal arterial
aneurysms. No radio-opaque calculi. Sclerosis of the right sacral
ala compatible with CT documented sacral insufficiency fracture.
IMPRESSION: 1. No evidence of small bowel obstruction. Enteric contrast is noted
within the colon.
2. Sclerosis of the right sacral ala compatible with right sacral
ale are insufficiency fracture.
3. Rim-like calcifications along the expected location of the renal
hila bilaterally compatible with known renal artery aneurysms.

## 2019-05-29 IMAGING — DX DG ABD PORTABLE 1V
1 series · 1 of 1 positions shown · non-contrast
Comparison: 08/24/2017 CT

CLINICAL DATA: Nasogastric tube placement.

EXAM:
PORTABLE ABDOMEN - 1 VIEW

[abdomen kub]
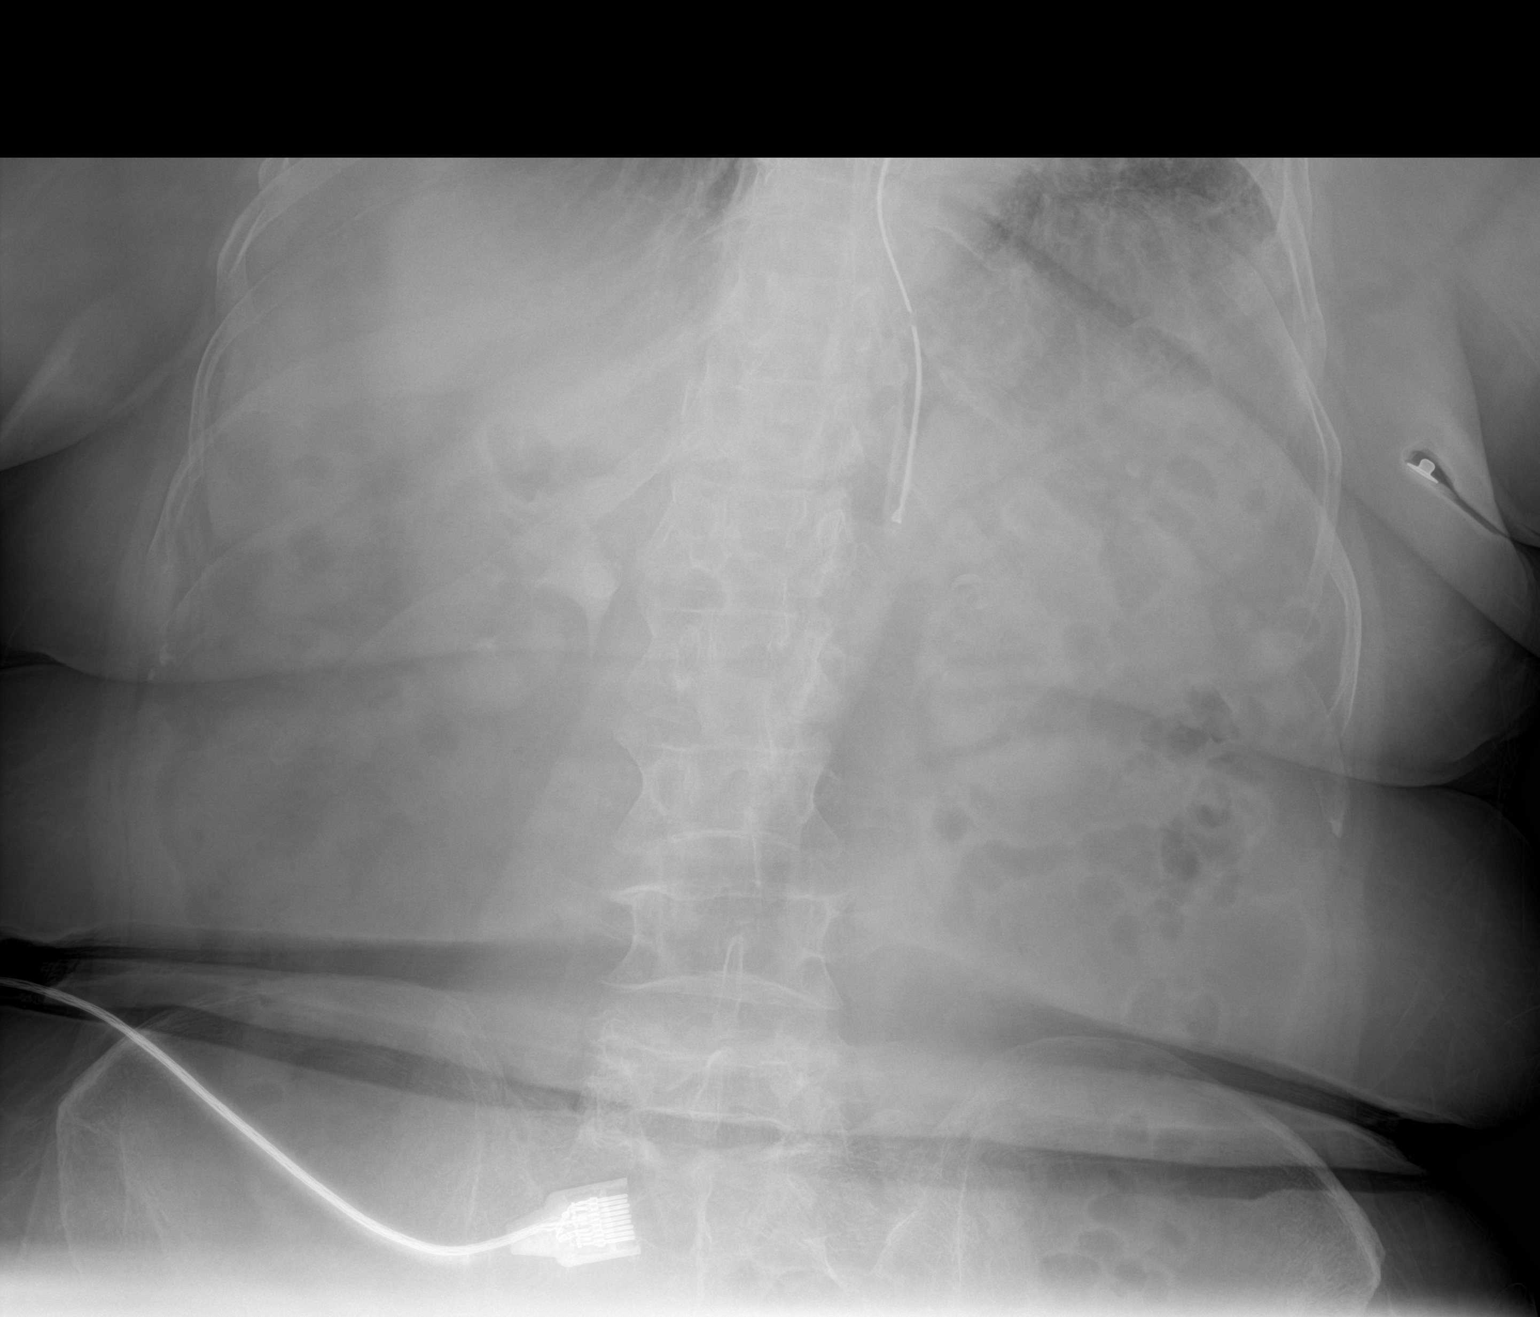

[1 of 1 positions shown; findings below may reference images not displayed]

FINDINGS: Nasogastric tube terminates at the proximal stomach with side port
in the region of the gastroesophageal junction. Contrast within
right renal collecting system. No free intraperitoneal air or
significant bowel dilatation identified.
IMPRESSION: Nasogastric tube terminating in the proximal stomach with side port
in the region of the gastroesophageal junction. Consider
advancement.

## 2019-05-31 ENCOUNTER — Ambulatory Visit: Payer: Medicare Other

## 2019-06-02 ENCOUNTER — Ambulatory Visit: Payer: Medicare Other

## 2019-06-04 ENCOUNTER — Encounter: Payer: Self-pay | Admitting: Family Medicine

## 2019-06-06 ENCOUNTER — Encounter: Payer: Self-pay | Admitting: Family Medicine

## 2019-06-07 ENCOUNTER — Other Ambulatory Visit: Payer: Self-pay | Admitting: Family Medicine

## 2019-06-07 DIAGNOSIS — N819 Female genital prolapse, unspecified: Secondary | ICD-10-CM

## 2019-06-09 ENCOUNTER — Other Ambulatory Visit: Payer: Self-pay | Admitting: Family Medicine

## 2019-06-10 ENCOUNTER — Other Ambulatory Visit: Payer: Self-pay | Admitting: Family Medicine

## 2019-06-10 DIAGNOSIS — M7918 Myalgia, other site: Secondary | ICD-10-CM

## 2019-06-10 DIAGNOSIS — G8929 Other chronic pain: Secondary | ICD-10-CM

## 2019-06-10 DIAGNOSIS — M792 Neuralgia and neuritis, unspecified: Secondary | ICD-10-CM

## 2019-06-11 NOTE — Telephone Encounter (Signed)
Last apt:05/19/19 Next apt: none Last fill: 01/14/19, BID,  #180, 1 RF No pain contract in chart

## 2019-06-18 ENCOUNTER — Emergency Department
Admission: EM | Admit: 2019-06-18 | Discharge: 2019-06-18 | Disposition: A | Discharge status: Home / Self Care | Payer: Medicare Other | Attending: Emergency Medicine | Admitting: Emergency Medicine

## 2019-06-18 ENCOUNTER — Telehealth: Payer: Self-pay

## 2019-06-18 ENCOUNTER — Emergency Department: Payer: Medicare Other

## 2019-06-18 ENCOUNTER — Encounter: Payer: Self-pay | Admitting: Emergency Medicine

## 2019-06-18 ENCOUNTER — Other Ambulatory Visit: Payer: Self-pay

## 2019-06-18 DIAGNOSIS — Z20822 Contact with and (suspected) exposure to covid-19: Secondary | ICD-10-CM | POA: Insufficient documentation

## 2019-06-18 DIAGNOSIS — Z7901 Long term (current) use of anticoagulants: Secondary | ICD-10-CM | POA: Insufficient documentation

## 2019-06-18 DIAGNOSIS — J449 Chronic obstructive pulmonary disease, unspecified: Secondary | ICD-10-CM | POA: Insufficient documentation

## 2019-06-18 DIAGNOSIS — Z85828 Personal history of other malignant neoplasm of skin: Secondary | ICD-10-CM | POA: Diagnosis not present

## 2019-06-18 DIAGNOSIS — Z79899 Other long term (current) drug therapy: Secondary | ICD-10-CM | POA: Insufficient documentation

## 2019-06-18 DIAGNOSIS — N183 Chronic kidney disease, stage 3 unspecified: Secondary | ICD-10-CM | POA: Insufficient documentation

## 2019-06-18 DIAGNOSIS — N12 Tubulo-interstitial nephritis, not specified as acute or chronic: Secondary | ICD-10-CM | POA: Diagnosis not present

## 2019-06-18 DIAGNOSIS — Z87891 Personal history of nicotine dependence: Secondary | ICD-10-CM | POA: Insufficient documentation

## 2019-06-18 DIAGNOSIS — I129 Hypertensive chronic kidney disease with stage 1 through stage 4 chronic kidney disease, or unspecified chronic kidney disease: Secondary | ICD-10-CM | POA: Insufficient documentation

## 2019-06-18 DIAGNOSIS — R109 Unspecified abdominal pain: Secondary | ICD-10-CM | POA: Diagnosis present

## 2019-06-18 LAB — URINALYSIS, COMPLETE (UACMP) WITH MICROSCOPIC
Bilirubin Urine: NEGATIVE
Glucose, UA: NEGATIVE mg/dL
Hgb urine dipstick: NEGATIVE
Ketones, ur: NEGATIVE mg/dL
Nitrite: POSITIVE — AB
Protein, ur: 30 mg/dL — AB
Specific Gravity, Urine: 1.011 (ref 1.005–1.030)
WBC, UA: >50 WBC/hpf — ABNORMAL HIGH (ref 0–5)
pH: 7 (ref 5.0–8.0)

## 2019-06-18 LAB — CBC
HCT: 44.5 % (ref 36.0–46.0)
Hemoglobin: 13.6 g/dL (ref 12.0–15.0)
MCH: 26.5 pg (ref 26.0–34.0)
MCHC: 30.6 g/dL (ref 30.0–36.0)
MCV: 86.6 fL (ref 80.0–100.0)
Platelets: 286 10*3/uL (ref 150–400)
RBC: 5.14 MIL/uL — ABNORMAL HIGH (ref 3.87–5.11)
RDW: 17.5 % — ABNORMAL HIGH (ref 11.5–15.5)
WBC: 7.4 10*3/uL (ref 4.0–10.5)
nRBC: 0 % (ref 0.0–0.2)

## 2019-06-18 LAB — COMPREHENSIVE METABOLIC PANEL
ALT: 22 U/L (ref 0–44)
AST: 39 U/L (ref 15–41)
Albumin: 3.6 g/dL (ref 3.5–5.0)
Alkaline Phosphatase: 110 U/L (ref 38–126)
Anion gap: 9 (ref 5–15)
BUN: 10 mg/dL (ref 8–23)
CO2: 26 mmol/L (ref 22–32)
Calcium: 9.8 mg/dL (ref 8.9–10.3)
Chloride: 104 mmol/L (ref 98–111)
Creatinine, Ser: 1.08 mg/dL — ABNORMAL HIGH (ref 0.44–1.00)
GFR calc Af Amer: 57 mL/min — ABNORMAL LOW (ref >60)
GFR calc non Af Amer: 49 mL/min — ABNORMAL LOW (ref >60)
Glucose, Bld: 107 mg/dL — ABNORMAL HIGH (ref 70–99)
Potassium: 4.6 mmol/L (ref 3.5–5.1)
Sodium: 139 mmol/L (ref 135–145)
Total Bilirubin: 0.7 mg/dL (ref 0.3–1.2)
Total Protein: 9 g/dL — ABNORMAL HIGH (ref 6.5–8.1)

## 2019-06-18 LAB — LIPASE, BLOOD: Lipase: 23 U/L (ref 11–51)

## 2019-06-18 MED ORDER — LEVOFLOXACIN 750 MG PO TABS: 750.0000 mg | ORAL_TABLET | Freq: Every day | ORAL | 0 refills | Status: AC

## 2019-06-18 MED ORDER — OXYCODONE-ACETAMINOPHEN 5-325 MG PO TABS
1.0000 | ORAL_TABLET | Freq: Once | ORAL | Status: AC
  Administered 2019-06-18: 18:00:00 1 via ORAL
  Filled 2019-06-18: qty 1

## 2019-06-18 NOTE — Telephone Encounter (Signed)
Patient's Daughter called back  Stated that the patient just left by ambulance to head to the hospital

## 2019-06-18 NOTE — Telephone Encounter (Signed)
[facility] - Client TELEPHONE ADVICE RECORD Access[employer]urse Patient Name: JAZZMYNE KELLOUGH Gender: Female DOB: 11/20/1940 Age: 79 Y 10 M 13 D Return Phone Number: [phone] (Primary), [phone] (Secondary) Address: [place] [address] Client [facility] - Client Client Site [facility] - Day Physician Deboraha Sprang- NP Contact Type Call Who Is Calling Patient / Member / Family / Caregiver Call Type Triage / Clinical Caller Name Heather Roberts Relationship To Patient Daughter Return Phone Number [phone] (Primary) Chief Complaint BREATHING - shortness of breath or sounds breathless Reason for Call Symptomatic / Request for Health Information Initial Comment Caller states mother has severe pain on right side of back, shortness of breath, PE in the past, and same symptoms. Translation No Nurse Assessment Nurse: Pollyann Savoy, RN, Melissa Date/Time (Eastern Time): 06/18/2019 11:01:22 AM Confirm and document reason for call. If symptomatic, describe symptoms. ---Caller states mother has severe pain on right side of her back(flank), shortness of breath, PE in the past, and same symptoms. Sxs started 4am-pain is constant and hurts when takes a deep breath. Denies fever. Has the patient had close contact with a person known or suspected to have the novel coronavirus illness OR traveled / lives in area with major community spread (including international travel) in the last 14 days from the onset of symptoms? * If Asymptomatic, screen for exposure and travel within the last 14 days. ---Yes Does the patient have any new or worsening symptoms? ---Yes Will a triage be completed? ---Yes Related visit to physician within the last 2 weeks? ---No Does the PT have any chronic conditions? (i.e. diabetes, asthma, this includes High risk factors for pregnancy, etc.) ---Yes List chronic conditions. ---COPD, hx  PE, hx Pneumonia, Hx DVT, HTN, High Chol. Is this a behavioral health or substance abuse call? ---No Guidelines Guideline Title Affirmed Question Affirmed Notes Nurse Date/Time (Eastern Time) Flank Pain [1] SEVERE pain (e.g., excruciating, scale 8-10) AND [2] present > 1 hour Zayas, RN, Melissa 06/18/2019 11:06:34 AM PLEASE NOTE: All timestamps contained within this report are represented as [place] Standard Time. CONFIDENTIALTY NOTICE: This fax transmission is intended only for the addressee. It contains information that is legally privileged, confidential or otherwise protected from use or disclosure. If you are not the intended recipient, you are strictly prohibited from reviewing, disclosing, copying using or disseminating any of this information or taking any action in reliance on or regarding this information. If you have received this fax in error, please notify us immediately by telephone so that we can arrange for its return to [place]. Phone: [phone], Toll-Free: [phone], Fax: [phone] Page: 2 of 2 Call Id: [id number] Disp. Time Lamount Cohen Time) Disposition Final User 06/18/2019 10:58:27 AM Send to Urgent Queue Kendall Flack 06/18/2019 11:07:58 AM Go to ED Now Yes Zayas, RN, Efraim Kaufmann Caller Disagree/Comply Disagree Caller Understands Yes PreDisposition InappropriateToAsk Care Advice Given Per Guideline GO TO ED NOW: * You need to be seen in the Emergency Department. * Go to the ED at ___________ Hospital. * Leave now. Drive carefully. ANOTHER ADULT SHOULD DRIVE: * It is better and safer if another adult drives instead of you. CARE ADVICE given per Flank Pain (Adult) guideline. Comments User: Susa Loffler, RN Date/Time Lamount Cohen Time): 06/18/2019 11:11:02 AM Pt refuses ED-requests nurse call office for an appt today. User: Susa Loffler, RN Date/Time Lamount Cohen Time): 06/18/2019 11:17:41 AM Call to Office-spoke with nurse, Sarahelizabeth Conway-Report given-nurse requests this nurse  tansfer pt/daughter to nurse.  Transferred per request. Jola Babinski! Referrals GO TO FACILITY REFUSED

## 2019-06-18 NOTE — ED Triage Notes (Signed)
Pt here for pain to right ribs/right upper side/abdominal pain. C/o nausea. Has had cough.  VSS at this time. Unlabored.  No injury.  Alert and oriented.  No fevers per pt.  Denies chest pain.

## 2019-06-18 NOTE — Telephone Encounter (Signed)
Noted  

## 2019-06-18 NOTE — ED Notes (Signed)
Pt presents to ED with c/o R flank pain since this morning. Pt states pain worse with movement. Pt c/o pressure with urination. Pt alert and oriented at this time, endorses that her daughter's boyfriend will be able to come get her. Pt asking this RN how much longer it will take because she wants to go next door and eat fish.

## 2019-06-18 NOTE — Telephone Encounter (Signed)
pts [name] and pt is on speaker phone together; Melissa with [employer] transferred call to me to speak with pt and daughter; Lenna Sciara said pt had gas pain in chest, right flank pain and came up ED disposition which was declined. I spoke with pt and Cathy(DPR signed) said pt had CP on 06/17/19. Today pt has pain in rt rib area; pt said at times feels like someone is nailing a nail into her lung that it hurts so bad. Rt chest hurts worse when pt tries to take a deep breath. Pt has hx of PE/DVT. Pt does not have diarrhea but has constipation usually. Last normal BM 06/17/19. Pt has burning and pain upon urination with rt flank pain. Pt has prod cough with milky phlegm on and off for yrs. Vomiting daily for years with hiatal hernia. Last vomited 06/17/19.no other covid symptoms noted,no travel and no known exposure to covid. Pt will go to [facility] ED if pts daughter can go back with her. I spoke with Theadora Rama in triage at [facility] ED; if pt has confusion or difficulty understanding questions or unable to answer questions appropriately then pts daughter would probably be allowed in back of ED with pt. Pt and her daughter both voiced understanding and pt also said while on hold pt coughed up a lot of thick fluid and pain has stopped hurting in rt rib area. Pt wants to know if that means she does not have pneumonia. I advised I could not answer that and pt will go to [facility] ED for eval and possible testing of UTI and CXR. FYI to [name] FNP.

## 2019-06-18 NOTE — ED Provider Notes (Signed)
Emergency Department Provider Note  ____________________________________________  Time seen: Approximately 5:31 PM  I have reviewed the triage vital signs and the nursing notes.   HISTORY  Chief Complaint Abdominal Pain   Historian Patient    HPI [name] is a 79 y.o. female presents to the emergency department with 8 out of 10 right-sided flank pain and nausea.  Patient denies vomiting.  She has had some dysuria and increased urinary frequency for the past 2 to 3 days..  No fevers or chills noted at home.  She has been able to ambulate.  No diarrhea or abdominal pain.  No other alleviating measures have been attempted.   Past Medical History:  Diagnosis Date  . Acute postoperative pain 02/03/2018  . Anemia   . B12 deficiency   . Bacteremia 10/07/2014  . Bladder incontinence   . Blind left eye   . Cancer ([place])    skin cancer   . Cataract   . Cervical spine fracture ([place])   . Chest pain 07/29/2013  . Compression fracture    C7,  upper T spine -compression fx  . COPD (chronic obstructive pulmonary disease) ([place])   . DDD (degenerative disc disease), lumbar   . Depression    major  . Diffuse myofascial pain syndrome 03/24/2015  . Dyspnea    at times- when activty  . Fever 10/05/2014  . GERD (gastroesophageal reflux disease)   . Hiatal hernia   . Hyperlipidemia   . Hypertension   . 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 Macular degeneration   . On home oxygen therapy    "3L; all the time" (04/30/2018)  . Osteoarthritis   . Osteoporosis   . Pneumonia    years ago  . Pulmonary embolism ([place]) 04/30/2018  . Reactive airway disease   . Rupture of bowel ([place])   . Sepsis ([place]) 10/08/2014  . Stroke [facility])    "mini stroke"- balance and memory issue  . Stroke ([place])   . Wrist pain, acute 09/10/2012     Immunizations up to date:  Yes.     Past Medical History:  Diagnosis Date  . Acute postoperative pain 02/03/2018  . Anemia   . B12 deficiency    . Bacteremia 10/07/2014  . Bladder incontinence   . Blind left eye   . Cancer ([place])    skin cancer   . Cataract   . Cervical spine fracture ([place])   . Chest pain 07/29/2013  . Compression fracture    C7,  upper T spine -compression fx  . COPD (chronic obstructive pulmonary disease) ([place])   . DDD (degenerative disc disease), lumbar   . Depression    major  . Diffuse myofascial pain syndrome 03/24/2015  . Dyspnea    at times- when activty  . Fever 10/05/2014  . GERD (gastroesophageal reflux disease)   . Hiatal hernia   . Hyperlipidemia   . Hypertension   . 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 Macular degeneration   . On home oxygen therapy    "3L; all the time" (04/30/2018)  . Osteoarthritis   . Osteoporosis   . Pneumonia    years ago  . Pulmonary embolism ([place]) 04/30/2018  . Reactive airway disease   . Rupture of bowel ([place])   . Sepsis ([place]) 10/08/2014  . Stroke [facility])    "mini stroke"- balance and memory issue  . Stroke ([place])   . Wrist pain, acute 09/10/2012    Patient Active Problem List  Diagnosis Date Noted  . Non-intractable vomiting 05/19/2019  . Anemia 05/04/2019  . History of deep vein thrombosis of lower extremity 01/05/2019  . GI bleed 10/29/2018  . ILD (interstitial lung disease) ([place]) 08/12/2018  . COPD exacerbation ([place]) 05/17/2018  . Pressure injury of skin 05/04/2018  . PE (pulmonary thromboembolism) ([place]) 04/30/2018  . Acute metabolic encephalopathy   . Respiratory failure with hypoxia ([place]) 04/20/2018  . CAP (community acquired pneumonia) 04/20/2018  . Acute lower UTI 04/20/2018  . Chronic lower extremity pain (Right) 04/07/2018  . Abnormal MRI, lumbar spine 04/07/2018  . Lumbar facet hypertrophy (Multilevel) (Bilateral) 04/07/2018  . Lumbar foraminal stenosis (L3-4, L4-5) (Bilateral) 04/07/2018  . Annular tear of lumbar disc (L3-4, L4-5) 04/07/2018  . Closed hip fracture, sequela (Right) 02/03/2018  . It band syndrome, right  12/18/2017  . History of hip replacement (Right) 12/18/2017  . Ileus ([place]) 10/27/2017  . Primary osteoarthritis of hip 09/30/2017  . History of small bowel obstruction 09/10/2017  . 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 SBO (small bowel obstruction) ([place]) 08/24/2017  . Abnormal x-ray of pelvis 08/13/2017  . Other specified dorsopathies, sacral and sacrococcygeal region 08/04/2017  . Spondylosis without myelopathy or radiculopathy, lumbosacral region 07/17/2017  . Non-traumatic compression fracture of T3 thoracic vertebra, sequela 07/02/2017  . High risk medication use 07/02/2017  . At high risk for falls 07/02/2017  . Chronic sacroiliac joint pain (Right) 07/02/2017  . CKD (chronic kidney disease) stage 3, GFR 30-59 ml/min 04/23/2017  . Chronic knee pain s/p total knee replacement (TKR) (Right) 04/02/2017  . DDD (degenerative disc disease), lumbar 03/05/2017  . Chronic knee pain (Bilateral) (R>L) 03/05/2017  . Osteoarthritis 03/05/2017  . Chronic musculoskeletal pain 03/05/2017  . Neurogenic pain 03/05/2017  . Chronic myofascial pain 02/12/2017  . Lumbar facet syndrome (Bilateral) (L>R) 02/12/2017  . Long term prescription benzodiazepine use 02/12/2017  . Disorder of skeletal system 02/12/2017  . Pharmacologic therapy 02/12/2017  . Problems influencing health status 02/12/2017  . 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 NSAID long-term use 02/12/2017  . DDD (degenerative disc disease), cervical 11/11/2016  . Lumbar facet arthropathy (Bilateral) 11/11/2016  . 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 CAFL (chronic airflow limitation) ([place]) 11/07/2016  . Depression, major, in partial remission ([place]) 11/07/2016  . 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 Long term current use of opiate analgesic 11/07/2016  . Long term prescription opiate use 11/07/2016  . Chronic pain syndrome 11/07/2016  . Chronic neck pain (Secondary Area of Pain)  (Bilateral) (L>R) 11/07/2016  . Chronic hip pain (Right) 11/07/2016  . Age-related osteoporosis with current pathological fracture with routine healing 09/20/2016  . History of stroke 09/20/2016  . Intertrochanteric fracture of femur, sequela (Right) 09/13/2016  . Chronic shoulder pain (Bilateral) 08/12/2016  . Hx of total knee replacement (Bilateral) 04/04/2016  . Chronic shoulder pain (Left) 02/28/2016  . Hoarseness 01/12/2016  . Pharyngoesophageal dysphagia 01/12/2016  . Chronic low back pain [facility] Area of Pain) (Bilateral) (L>R) 10/26/2015  . 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 Syncope, non cardiac 05/22/2015  . Thrombocytopenia ([place]) 10/07/2014  . Hypoxia 10/05/2014  . Anxiety 06/15/2014  . Duodenogastric reflux 06/15/2014  . Benign essential HTN 06/15/2014  . Hypercholesteremia 07/30/2013  . HTN (hypertension) 07/30/2013  . GERD (gastroesophageal reflux disease) 07/30/2013  . Memory loss 05/03/2013  . Chronic knee pain (Primary Area of Pain) (Right) 09/10/2012  Past Surgical History:  Procedure Laterality Date  . ABDOMINAL HYSTERECTOMY    . ABDOMINAL SURGERY    . APPENDECTOMY    . BLADDER SURGERY    . CATARACT EXTRACTION W/ INTRAOCULAR LENS  IMPLANT, BILATERAL Bilateral   . CHOLECYSTECTOMY    . COLON SURGERY    . COLOSTOMY REVERSAL    . ESOPHAGOGASTRODUODENOSCOPY N/A 10/30/2018   Procedure: ESOPHAGOGASTRODUODENOSCOPY (EGD);  Surgeon: Lin Landsman, MD;  [facility] END[facility]COPY;  Service: Gastroenterology;  Laterality: N/A;  . INTRAMEDULLARY (IM) NAIL INTERTROCHANTERIC Right 09/13/2016   Procedure: INTRAMEDULLARY (IM) NAIL INTERTROCHANTRIC RIGHT;  Surgeon: Leandrew Koyanagi, MD;  Location: [place];  Service: Orthopedics;  Laterality: Right;  . JOINT REPLACEMENT Bilateral   . KNEE SURGERY    . OSTOMY    . RECTOCELE REPAIR    . TOE SURGERY Right    3rd  . TOTAL HIP ARTHROPLASTY Right  09/30/2017   Procedure: RIGHT HIP IM NAIL REMOVAL WITH CONVERSION TO TOTAL HIP ARTHOPLASTY, anterior approach;  Surgeon: Renette Butters, MD;  Location: [place];  Service: Orthopedics;  Laterality: Right;    Prior to Admission medications   Medication Sig Start Date End Date Taking? Authorizing Provider  ALPRAZolam (XANAX) 1 MG tablet TAKE 1 TABLET (1 MG TOTAL) BY MOUTH AT BEDTIME AS NEEDED FOR ANXIETY. 06/11/19   [name], FNP  pregabalin (LYRICA) 100 MG capsule TAKE 1 CAPSULE BY MOUTH TWICE A DAY 06/11/19   [name], FNP  acetaminophen (TYLENOL) 500 MG tablet Take 2 tablets (1,000 mg total) by mouth 3 (three) times daily. Patient taking differently: Take 1,000 mg by mouth 2 (two) times daily.  08/25/18   Ria Bush, MD  albuterol (ACCUNEB) 1.25 MG/3ML nebulizer solution Take 3 mLs (1.25 mg total) by nebulization every 6 (six) hours as needed for wheezing. 08/26/18   [name], FNP  albuterol (VENTOLIN HFA) 108 (90 Base) MCG/ACT inhaler Inhale 2 puffs into the lungs every 6 (six) hours as needed for wheezing or shortness of breath. 09/25/18   [name], FNP  alendronate (FOSAMAX) 70 MG tablet TAKE 1 TABLET BY MOUTH ONCE A WEEK 06/09/19   [name], FNP  amLODipine (NORVASC) 5 MG tablet TAKE 1 TABLET BY MOUTH EVERY DAY 03/29/19   [name], FNP  apixaban (ELIQUIS) 2.5 MG TABS tablet Take 1 tablet (2.5 mg total) by mouth 2 (two) times daily. 05/04/19   Earlie Server, MD  docusate sodium (COLACE) 100 MG capsule Take 200 mg by mouth 2 (two) times daily as needed for moderate constipation.     [provider]  DULoxetine (CYMBALTA) 20 MG capsule Take 2 capsules (40 mg total) by mouth daily. 04/30/19   [name], FNP  esomeprazole (NEXIUM) 40 MG capsule Take 1 capsule (40 mg total) by mouth daily. 03/24/19   [name], FNP  fluticasone (FLONASE) 50 MCG/ACT nasal spray Place 2 sprays into both nostrils daily. 03/08/19   [name], FNP  fluticasone (FLOVENT HFA) 220 MCG/ACT inhaler Inhale 1 puff into the lungs 2 (two) times daily. 09/25/18   [name], FNP  levofloxacin (LEVAQUIN) 750 MG tablet Take 1 tablet (750 mg total) by mouth daily for 5 days. 06/18/19 06/23/19  Lannie Fields, PA-C  LINZESS 290 MCG CAPS capsule TAKE 1 CAPSULE BY MOUTH EVERY DAY 02/26/19   [name], FNP  lovastatin (MEVACOR) 10 MG tablet Take 1 tablet (10 mg total) by mouth daily.  09/10/18   [name], FNP  NARCAN 4 MG/0.1ML LIQD nasal spray kit  09/10/18   [provider]  ondansetron (ZOFRAN ODT) 4 MG disintegrating tablet Take 1 tablet (4 mg total) by mouth every 8 (eight) hours as needed for nausea or vomiting. 06/12/18   [name], FNP  Oxycodone HCl 10 MG TABS Take 10 mg by mouth 3 (three) times daily. 09/23/18   [provider]  polyethylene glycol (MIRALAX / GLYCOLAX) packet Take 17 g by mouth daily as needed (for constipation.).     [provider]  promethazine (PHENERGAN) 12.5 MG tablet Take 1 tablet (12.5 mg total) by mouth every 12 (twelve) hours as needed for nausea or vomiting. 05/19/19   [name], FNP  traZODone (DESYREL) 50 MG tablet TAKE 0.5-1 TABLETS (25-50 MG TOTAL) BY MOUTH AT BEDTIME AS NEEDED FOR SLEEP. 05/12/19   [name], FNP    Allergies Ampicillin, Ampicillin-sulbactam sodium, Ambien [zolpidem tartrate], Flexeril [cyclobenzaprine], Tape, Toradol [ketorolac tromethamine], Sulfamethoxazole-trimethoprim, Zolpidem, Cephalexin, and Tramadol  Family History  Problem Relation Age of Onset  . Heart failure Mother   . Heart attack Father   . Lung cancer Sister   . Deep vein thrombosis Sister   . Pulmonary embolism Son   . Deep vein thrombosis Son   . Cervical cancer Daughter     Social History Social History   Tobacco Use  . Smoking status: Former Smoker    Packs/day: 1.50    Years: 30.00    Pack years: 45.00    Types: E-cigarettes,  Cigarettes    Quit date: 2013    Years since quitting: 8.0  . Smokeless tobacco: Never Used  Substance Use Topics  . Alcohol use: No    Alcohol/week: 0.0 standard drinks  . Drug use: No     Review of Systems  Constitutional: No fever/chills Eyes:  No discharge ENT: No upper respiratory complaints. Respiratory: no cough. No SOB/ use of accessory muscles to breath Gastrointestinal:   No nausea, no vomiting.  No diarrhea.  No constipation. Genitourinary: Patient has right sided flank pain.  Musculoskeletal: Negative for musculoskeletal pain. Skin: Negative for rash, abrasions, lacerations, ecchymosis.    ____________________________________________   PHYSICAL EXAM:  VITAL SIGNS: ED Triage Vitals  Enc Vitals Group     BP 06/18/19 1433 (!) 142/90     Pulse Rate 06/18/19 1433 (!) 107     Resp 06/18/19 1433 20     Temp 06/18/19 1433 98.3 F (36.8 C)     Temp Source 06/18/19 1433 Oral     SpO2 06/18/19 1433 92 %     Weight 06/18/19 1432 180 lb (81.6 kg)     Height 06/18/19 1432 5' 4" (1.626 m)     Head Circumference --      Peak Flow --      Pain Score 06/18/19 1432 9     Pain Loc --      Pain Edu? --      Excl. in [place]? --      Constitutional: Alert and oriented. Well appearing and in no acute distress. Eyes: Conjunctivae are normal. PERRL. EOMI. Head: Atraumatic. Cardiovascular: Normal rate, regular rhythm. Normal S1 and S2.  Good peripheral circulation. Respiratory: Normal respiratory effort without tachypnea or retractions. Lungs CTAB. Good air entry to the bases with no decreased or absent breath sounds Gastrointestinal: Bowel sounds x 4 quadrants. Soft and nontender to palpation. No guarding or rigidity. Patient has right sided  CVA tenderness to palpation.  Musculoskeletal: Full range of motion to all extremities. No obvious deformities noted Neurologic:  Normal for age. No gross focal neurologic deficits are appreciated.  Skin:  Skin is warm, dry and intact. No  rash noted. Psychiatric: Mood and affect are normal for age. Speech and behavior are normal.   ____________________________________________   LABS (all labs ordered are listed, but only abnormal results are displayed)  Labs Reviewed  COMPREHENSIVE METABOLIC PANEL - Abnormal; Notable for the following components:      Result Value   Glucose, Bld 107 (*)    Creatinine, Ser 1.08 (*)    Total Protein 9.0 (*)    GFR calc non Af Amer 49 (*)    GFR calc Af Amer 57 (*)    All other components within normal limits  CBC - Abnormal; Notable for the following components:   RBC 5.14 (*)    RDW 17.5 (*)    All other components within normal limits  URINALYSIS, COMPLETE (UACMP) WITH MICROSCOPIC - Abnormal; Notable for the following components:   Color, Urine YELLOW (*)    APPearance CLOUDY (*)    Protein, ur 30 (*)    Nitrite POSITIVE (*)    Leukocytes,Ua LARGE (*)    WBC, UA >50 (*)    Bacteria, UA FEW (*)    All other components within normal limits  SARS CORONAVIRUS 2 (TAT 6-24 HRS)  LIPASE, BLOOD   ____________________________________________  EKG   ____________________________________________  RADIOLOGY Unk Pinto, personally viewed and evaluated these images (plain radiographs) as part of my medical decision making, as well as reviewing the written report by the radiologist.  DG Chest 2 View  Result Date: 06/18/2019 CLINICAL DATA:  Cough, right rib pain. EXAM: CHEST - 2 VIEW COMPARISON:  October 29, 2018. FINDINGS: The heart size and mediastinal contours are within normal limits. No pneumothorax or pleural effusion is noted. Stable bilateral interstitial densities are noted concerning for fibrosis, although superimposed inflammation cannot be excluded. The visualized skeletal structures are unremarkable. IMPRESSION: Stable bilateral interstitial densities are noted concerning for fibrosis, although superimposed inflammation cannot be excluded. Electronically Signed   By: Marijo Conception M.D.   On: 06/18/2019 15:31   CT Renal Stone Study  Result Date: 06/18/2019 CLINICAL DATA:  Right-sided abdominal pain and right chest wall pain. Nausea. EXAM: CT ABDOMEN AND PELVIS WITHOUT CONTRAST TECHNIQUE: Multidetector CT imaging of the abdomen and pelvis was performed following the standard protocol without IV contrast. COMPARISON:  04/30/2018 FINDINGS: Lower chest: Heart is enlarged. Chronic fibrotic changes at the lung bases. No acute findings. Hepatobiliary: Normal liver. Gallbladder surgically absent. Mild chronic dilation of common bile duct with distal tapering. Pancreas: Unremarkable. No pancreatic ductal dilatation or surrounding inflammatory changes. Spleen: Spleen normal in size. Scattered calcified granuloma. No masses. Adrenals/Urinary Tract: No adrenal masses. Kidneys normal in size, orientation and position. Low-attenuation, 1.4 cm, 3 exophytic low-attenuation masses from the lower pole the left kidney, 2 largest measuring 1.4 cm, consistent with cysts and similar to the prior CT. No other masses. No collecting system stones. No hydronephrosis. Normal ureters. Normal bladder. Stomach/Bowel: Stomach is unremarkable. Small bowel and colon are normal in caliber. No wall thickening. No inflammation. Vascular/Lymphatic: Aortic atherosclerosis. No aneurysm. No enlarged lymph nodes. Reproductive: Status post hysterectomy. No adnexal masses. Other: No abdominal wall hernia or abnormality. No abdominopelvic ascites. Musculoskeletal: Total right hip arthroplasty, which appears well seated and aligned. No fracture. No bone lesion. IMPRESSION: 1. No  acute findings within the abdomen or pelvis. No renal or ureteral stones. No obstructive uropathy. 2. Chronic findings include low-density left renal masses consistent with cysts, chronic dilation of the common bile duct, aortic atherosclerosis, changes from a prior hysterectomy, mild cardiomegaly and chronic fibrotic changes at the lung bases.  Electronically Signed   By: Lajean Manes M.D.   On: 06/18/2019 17:51    ____________________________________________    PROCEDURES  Procedure(s) performed:     Procedures     Medications  oxyCODONE-acetaminophen (PERCOCET/ROXICET) 5-325 MG per tablet 1 tablet (1 tablet Oral Given 06/18/19 1758)     ____________________________________________   INITIAL IMPRESSION / ASSESSMENT AND PLAN / ED COURSE  Pertinent labs & imaging results that were available during my care of the patient were reviewed by me and considered in my medical decision making (see chart for details).      Assessment and Plan:  Flank pain:  79 year old female presents to the emergency department with right-sided flank pain for the past 2 to 3 days.  Patient was hypertensive and mildly tachycardic at triage.  She did have right-sided CVA tenderness.  Differential diagnosis includes cystitis, pyelonephritis, nephrolithiasis...  No leukocytosis on CBC.  Creatinine was mildly elevated on CMP.  Urinalysis was concerning for cystitis.   Will obtain CT renal stone study and will reassess.  CT renal stone study revealed no evidence of nephrolithiasis.  Will treat with Levaquin at home.  Patient requested send out COVID-19 testing.  Results are in progress.  All patient questions were answered.  [name] was evaluated in Emergency Department on 06/18/2019 for the symptoms described in the history of present illness. She was evaluated in the context of the global COVID-19 pandemic, which necessitated consideration that the patient might be at risk for infection with the SARS-CoV-2 virus that causes COVID-19. Institutional protocols and algorithms that pertain to the evaluation of patients at risk for COVID-19 are in a state of rapid change based on information released by regulatory bodies including the CDC and federal and state organizations. These policies and algorithms were followed during the patient's  care in the ED.   ____________________________________________  FINAL CLINICAL IMPRESSION(S) / ED DIAGNOSES  Final diagnoses:  Pyelonephritis      NEW MEDICATIONS STARTED DURING THIS VISIT:  ED Discharge Orders         Ordered    levofloxacin (LEVAQUIN) 750 MG tablet  Daily     06/18/19 1822              This chart was dictated using voice recognition software/Dragon. Despite best efforts to proofread, errors can occur which can change the meaning. Any change was purely unintentional.     Lannie Fields, PA-C 06/18/19 Pollie Meyer    Duffy Bruce, MD 06/19/19 2107

## 2019-06-19 ENCOUNTER — Ambulatory Visit: Payer: Medicare Other

## 2019-06-19 LAB — SARS CORONAVIRUS 2 (TAT 6-24 HRS): SARS Coronavirus 2: NEGATIVE

## 2019-06-22 ENCOUNTER — Other Ambulatory Visit: Payer: Medicare Other | Admitting: Adult Health Nurse Practitioner

## 2019-06-22 ENCOUNTER — Other Ambulatory Visit: Payer: Self-pay | Admitting: Family Medicine

## 2019-06-22 ENCOUNTER — Other Ambulatory Visit: Payer: Self-pay

## 2019-06-24 ENCOUNTER — Encounter: Payer: Self-pay | Admitting: Family Medicine

## 2019-06-24 ENCOUNTER — Telehealth: Payer: Self-pay | Admitting: Adult Health Nurse Practitioner

## 2019-06-24 ENCOUNTER — Other Ambulatory Visit: Payer: Self-pay

## 2019-06-24 ENCOUNTER — Other Ambulatory Visit: Payer: Medicare Other | Admitting: Adult Health Nurse Practitioner

## 2019-06-24 NOTE — Telephone Encounter (Signed)
Had received message from office that patient had to reschedule today's visit.  Called patient's daughter to reschedule.  Left VM with reason for call and contact info. [name] K. Olena Heckle NP

## 2019-06-25 ENCOUNTER — Other Ambulatory Visit: Payer: Self-pay | Admitting: Family Medicine

## 2019-06-25 MED ORDER — TRAZODONE HCL 50 MG PO TABS: 25.0000 mg | ORAL_TABLET | Freq: Every evening | ORAL | 0 refills | Status: DC | PRN

## 2019-06-26 ENCOUNTER — Other Ambulatory Visit: Payer: Self-pay | Admitting: Family Medicine

## 2019-06-29 ENCOUNTER — Telehealth: Payer: Self-pay | Admitting: Adult Health Nurse Practitioner

## 2019-06-29 NOTE — Telephone Encounter (Signed)
Spoke with [name], to see if she wanted to reschedule the 1/28 Palliative f/u appointment and she wanted me to call her back on Friday, 2/5 - she said she needed to contact the MD office about another appointment at [facility].  Will f/u at that time.

## 2019-07-01 ENCOUNTER — Encounter: Payer: Medicare Other | Admitting: Obstetrics & Gynecology

## 2019-07-03 ENCOUNTER — Other Ambulatory Visit: Payer: Self-pay | Admitting: Family Medicine

## 2019-07-05 IMAGING — DX DG HIP (WITH OR WITHOUT PELVIS) 1V PORT*R*
1 series · 1 of 1 positions shown · non-contrast
Comparison: None.

CLINICAL DATA: History of total hip arthroplasty.

EXAM:
DG HIP (WITH OR WITHOUT PELVIS) 1V PORT RIGHT

[pelvis]
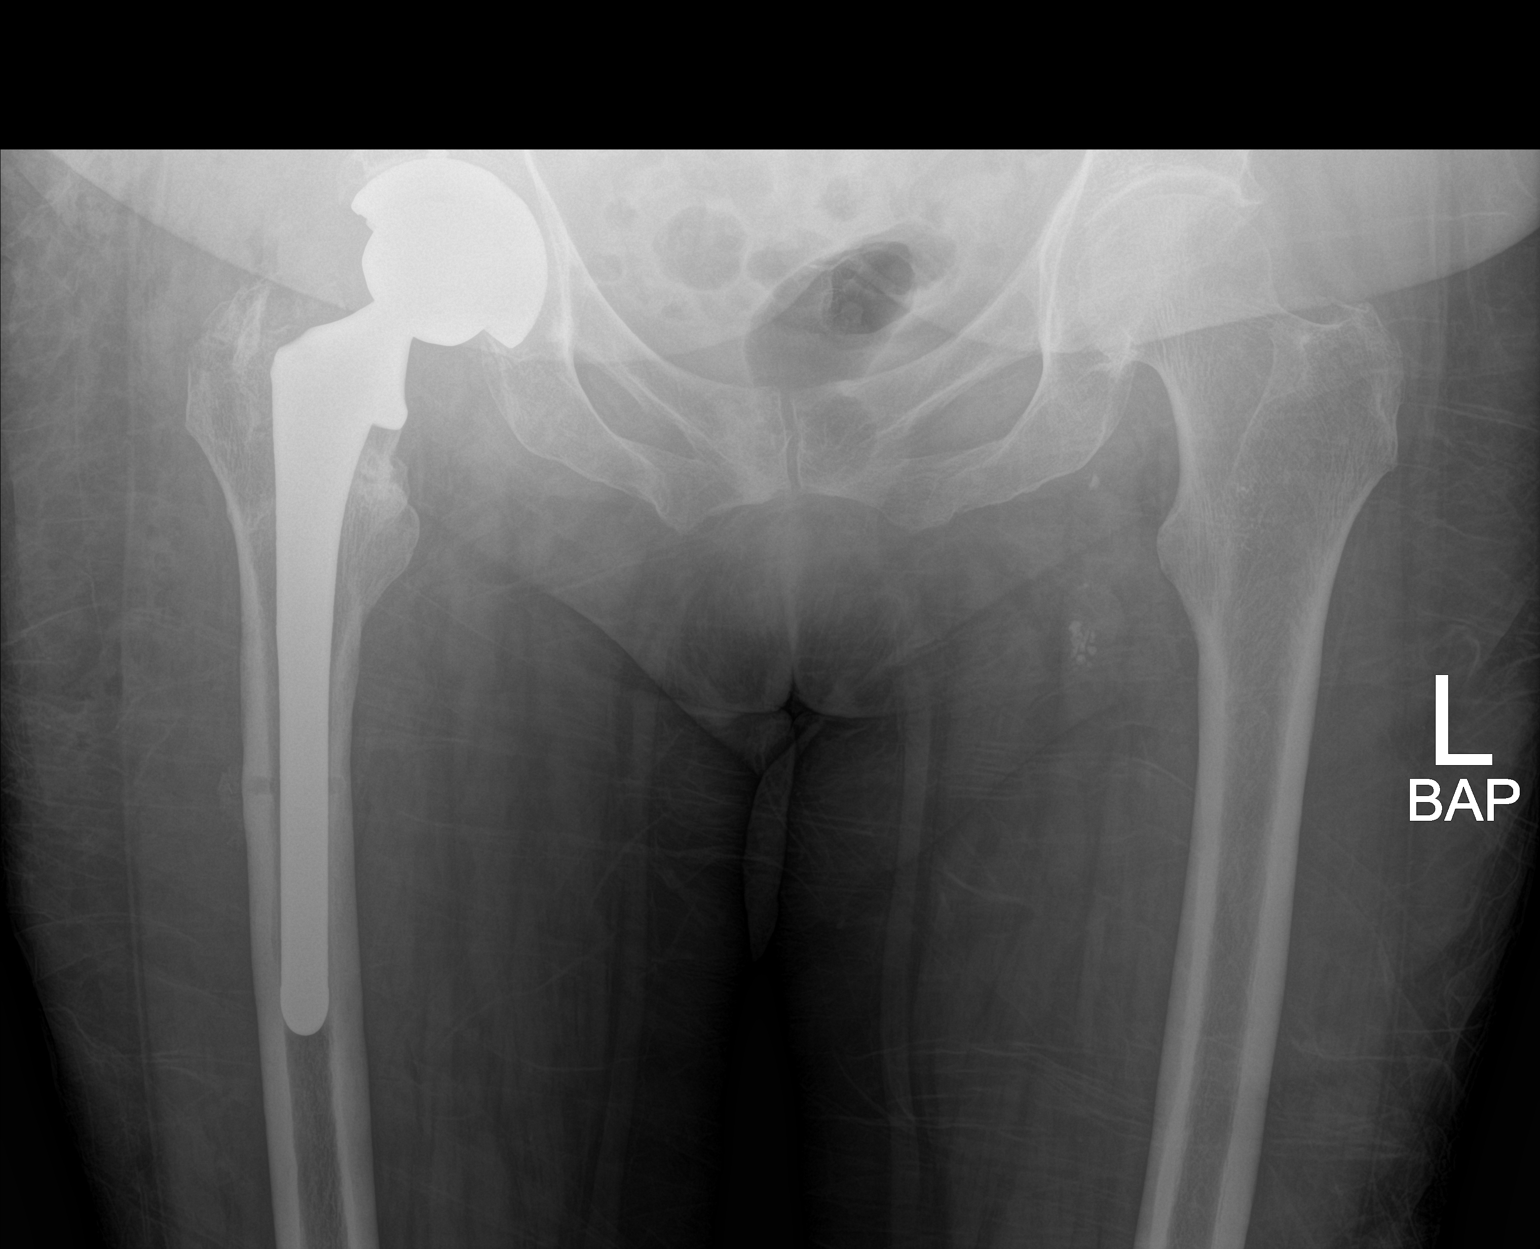

[1 of 1 positions shown; findings below may reference images not displayed]

FINDINGS: Patient has undergone RIGHT total hip arthroplasty. No adverse
features..
IMPRESSION: Satisfactory position and alignment.

## 2019-07-05 IMAGING — RF DG C-ARM 61-120 MIN
1 series · 2 of 2 positions shown · non-contrast
Comparison: 09/13/2016

CLINICAL DATA: 77-year-old female post total right hip replacement.
Initial encounter.

EXAM:
OPERATIVE right HIP (WITH PELVIS IF PERFORMED) 2 VIEWS
Fluoroscopic time: 1 minutes and 28 seconds.
TECHNIQUE: Fluoroscopic spot image(s) were submitted for interpretation
post-operatively.

[Series 1: run · 2 of 2 slices shown]
[im 1/2]
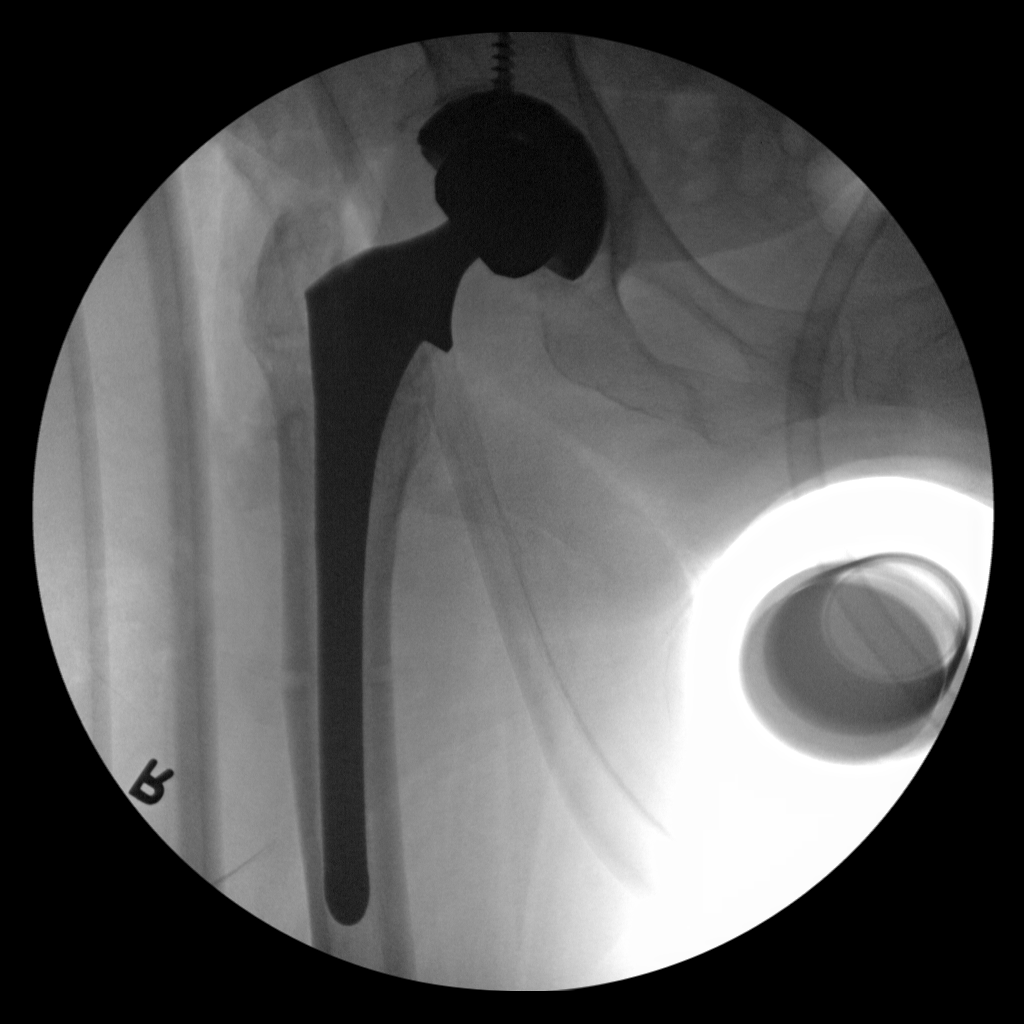
[im 2/2]
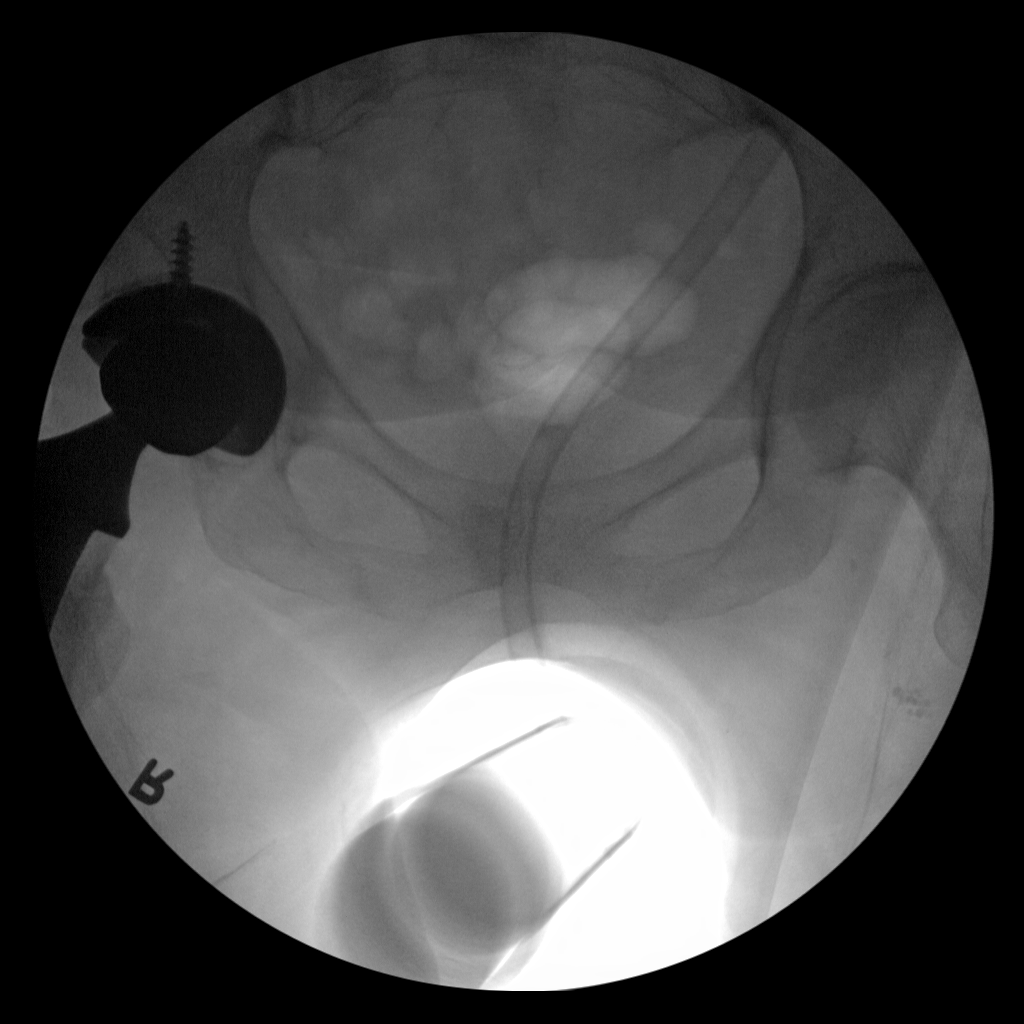

[2 of 2 positions shown; findings below may reference images not displayed]

FINDINGS: Removal of right femoral dynamic sliding type screw and placement of
right total hip. On frontal projection, prosthesis appears in
satisfactory position without complication noted.
IMPRESSION: Post total right hip replacement.

## 2019-07-06 ENCOUNTER — Other Ambulatory Visit: Payer: Medicare Other | Admitting: Adult Health Nurse Practitioner

## 2019-07-06 NOTE — Telephone Encounter (Signed)
Will route to Dr Tasia Catchings since their office has been following these refills.

## 2019-07-07 DIAGNOSIS — Z96649 Presence of unspecified artificial hip joint: Secondary | ICD-10-CM | POA: Insufficient documentation

## 2019-07-07 DIAGNOSIS — T84038A Mechanical loosening of other internal prosthetic joint, initial encounter: Secondary | ICD-10-CM | POA: Insufficient documentation

## 2019-07-08 ENCOUNTER — Encounter: Payer: Self-pay | Admitting: Family Medicine

## 2019-07-09 ENCOUNTER — Encounter: Payer: Self-pay | Admitting: Family Medicine

## 2019-07-09 ENCOUNTER — Other Ambulatory Visit: Payer: Self-pay | Admitting: Family Medicine

## 2019-07-09 DIAGNOSIS — I2699 Other pulmonary embolism without acute cor pulmonale: Secondary | ICD-10-CM

## 2019-07-09 DIAGNOSIS — Z01818 Encounter for other preprocedural examination: Secondary | ICD-10-CM

## 2019-07-09 DIAGNOSIS — J449 Chronic obstructive pulmonary disease, unspecified: Secondary | ICD-10-CM

## 2019-07-09 NOTE — Progress Notes (Signed)
Referrals placed to cardiology and pulmonary for preoperative clearance

## 2019-07-12 ENCOUNTER — Ambulatory Visit (INDEPENDENT_AMBULATORY_CARE_PROVIDER_SITE_OTHER): Payer: Medicare Other | Admitting: Cardiology

## 2019-07-12 ENCOUNTER — Other Ambulatory Visit: Payer: Self-pay

## 2019-07-12 ENCOUNTER — Encounter: Payer: Self-pay | Admitting: Cardiology

## 2019-07-12 VITALS — BP 128/88 | HR 89 | Ht 64.0 in | Wt 170.8 lb

## 2019-07-12 DIAGNOSIS — I10 Essential (primary) hypertension: Secondary | ICD-10-CM | POA: Diagnosis not present

## 2019-07-12 DIAGNOSIS — Z01810 Encounter for preprocedural cardiovascular examination: Secondary | ICD-10-CM

## 2019-07-12 DIAGNOSIS — R079 Chest pain, unspecified: Secondary | ICD-10-CM | POA: Diagnosis not present

## 2019-07-12 NOTE — Patient Instructions (Signed)
Medication Instructions:  Your physician recommends that you continue on your current medications as directed. Please refer to the Current Medication list given to you today.  *If you need a refill on your cardiac medications before your next appointment, please call your pharmacy*  Lab Work: None ordered If you have labs (blood work) drawn today and your tests are completely normal, you will receive your results only by: Marland Kitchen MyChart Message (if you have MyChart) OR . A paper copy in the mail If you have any lab test that is abnormal or we need to change your treatment, we will call you to review the results.  Testing/Procedures: Your physician has requested that you have a lexiscan myoview. For further information please visit [email or web address]. Please follow instruction sheet, as given.    Follow-Up: At [facility], you and your health needs are our priority.  As part of our continuing mission to provide you with exceptional heart care, we have created designated Provider Care Teams.  These Care Teams include your primary Cardiologist (physician) and Advanced Practice Providers (APPs -  Physician Assistants and Nurse Practitioners) who all work together to provide you with the care you need, when you need it.  Your next appointment:   After testing   The format for your next appointment:   In Person  Provider:    You may see Kate Sable, MD or one of the following Advanced Practice Providers on your designated Care Team:    Murray Hodgkins, NP  Christell Faith, PA-C  Marrianne Mood, PA-C   Other Instructions [place]  Your caregiver has ordered a Stress Test with nuclear imaging. The purpose of this test is to evaluate the blood supply to your heart muscle. This procedure is referred to as a "Non-Invasive Stress Test." This is because other than having an IV started in your vein, nothing is inserted or "invades" your body. Cardiac stress tests are done to find  areas of poor blood flow to the heart by determining the extent of coronary artery disease (CAD). Some patients exercise on a treadmill, which naturally increases the blood flow to your heart, while others who are  unable to walk on a treadmill due to physical limitations have a pharmacologic/chemical stress agent called Lexiscan . This medicine will mimic walking on a treadmill by temporarily increasing your coronary blood flow.   Please note: these test may take anywhere between 2-4 hours to complete  PLEASE REPORT TO [place] AT THE FIRST DESK WILL DIRECT YOU WHERE TO GO  Date of Procedure:_____________________________________  Arrival Time for Procedure:______________________________   PLEASE NOTIFY THE OFFICE AT LEAST 24 HOURS IN ADVANCE IF YOU ARE UNABLE TO KEEP YOUR APPOINTMENT.  [phone] AND  PLEASE NOTIFY NUCLEAR MEDICINE AT [facility] AT LEAST 24 HOURS IN ADVANCE IF YOU ARE UNABLE TO KEEP YOUR APPOINTMENT. [phone]  How to prepare for your Myoview test:  1. Do not eat or drink after midnight 2. No caffeine for 24 hours prior to test 3. No smoking 24 hours prior to test. 4. Your medication may be taken with water.  If your doctor stopped a medication because of this test, do not take that medication. 5. Ladies, please do not wear dresses.  Skirts or pants are appropriate. Please wear a short sleeve shirt. 6. No perfume, cologne or lotion. 7. Wear comfortable walking shoes. No heels!

## 2019-07-12 NOTE — Progress Notes (Signed)
Cardiology Office Note:    Date:  07/12/2019   ID:  [name], DOB 1941-01-13, MRN [id number]   PCP:  [name], FNP  Cardiologist:  Kate Sable, MD  Electrophysiologist:  None   Referring MD: [name], FNP   Chief Complaint  Patient presents with  . New Patient (Initial Visit)    Ref by Clarene Reamer, NP for cardiac clearance for right hip surgery; scheduled for 08/02/2019. Pt. c/o chest pain, HTN, shortness of breath with over exertion.     History of Present Illness:    [name] is a 79 y.o. female with a hx of pretension, hyperlipidemia, COPD, PE on Eliquis, GERD, former smoker, TIA who presents for preop eval prior to right hip surgery.  Patient has a history of right hip pain and is scheduled to undergo surgery next month.  Due to risk factors, cardiac eval requested.  She states having chest discomfort this morning when her blood pressures were elevated.  Also states having epigastric pain 2 days ago which lasted a few seconds and then subsided.  She attributes the symptoms of chest pain due to anxiety.  She does not do a lot of walking due to right hip pain.  Her daughter helps her with ambulation.  Last echocardiogram on 04/2018 showed normal systolic function with EF 60 to 65%, mild MR, moderately calcified aortic annulus no stenosis.  Past Medical History:  Diagnosis Date  . Acute postoperative pain 02/03/2018  . Anemia   . B12 deficiency   . Bacteremia 10/07/2014  . Bladder incontinence   . Blind left eye   . Cancer ([place])    skin cancer   . Cataract   . Cervical spine fracture ([place])   . Chest pain 07/29/2013  . Compression fracture    C7,  upper T spine -compression fx  . COPD (chronic obstructive pulmonary disease) ([place])   . DDD (degenerative disc disease), lumbar   . Depression    major  . Diffuse myofascial pain syndrome 03/24/2015  . Dyspnea    at times- when activty  . Fever 10/05/2014  . GERD (gastroesophageal reflux  disease)   . Hiatal hernia   . Hyperlipidemia   . Hypertension   . 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 Macular degeneration   . On home oxygen therapy    "3L; all the time" (04/30/2018)  . Osteoarthritis   . Osteoporosis   . Pneumonia    years ago  . Pulmonary embolism ([place]) 04/30/2018  . Reactive airway disease   . Rupture of bowel ([place])   . Sepsis ([place]) 10/08/2014  . Stroke [facility])    "mini stroke"- balance and memory issue  . Stroke ([place])   . Wrist pain, acute 09/10/2012    Past Surgical History:  Procedure Laterality Date  . ABDOMINAL HYSTERECTOMY    . ABDOMINAL SURGERY    . APPENDECTOMY    . BLADDER SURGERY    . CATARACT EXTRACTION W/ INTRAOCULAR LENS  IMPLANT, BILATERAL Bilateral   . CHOLECYSTECTOMY    . COLON SURGERY    . COLOSTOMY REVERSAL    . ESOPHAGOGASTRODUODENOSCOPY N/A 10/30/2018   Procedure: ESOPHAGOGASTRODUODENOSCOPY (EGD);  Surgeon: Lin Landsman, MD;  Location: [facility] ENDOSCOPY;  Service: Gastroenterology;  Laterality: N/A;  . INTRAMEDULLARY (IM) NAIL INTERTROCHANTERIC Right 09/13/2016   Procedure: INTRAMEDULLARY (IM) NAIL INTERTROCHANTRIC RIGHT;  Surgeon: Leandrew Koyanagi, MD;  Location: [place];  Service: Orthopedics;  Laterality: Right;  . JOINT REPLACEMENT Bilateral   .  KNEE SURGERY    . OSTOMY    . RECTOCELE REPAIR    . TOE SURGERY Right    3rd  . TOTAL HIP ARTHROPLASTY Right 09/30/2017   Procedure: RIGHT HIP IM NAIL REMOVAL WITH CONVERSION TO TOTAL HIP ARTHOPLASTY, anterior approach;  Surgeon: Renette Butters, MD;  Location: [place];  Service: Orthopedics;  Laterality: Right;    Current Medications: Current Meds  Medication Sig  . acetaminophen (TYLENOL) 500 MG tablet Take 2 tablets (1,000 mg total) by mouth 3 (three) times daily. (Patient taking differently: Take 1,000 mg by mouth 2 (two) times daily. )  . albuterol (ACCUNEB) 1.25 MG/3ML nebulizer solution Take 3 mLs (1.25 mg total) by nebulization every 6 (six) hours as needed for  wheezing.  Marland Kitchen albuterol (VENTOLIN HFA) 108 (90 Base) MCG/ACT inhaler Inhale 2 puffs into the lungs every 6 (six) hours as needed for wheezing or shortness of breath.  Marland Kitchen alendronate (FOSAMAX) 70 MG tablet TAKE 1 TABLET BY MOUTH ONCE A WEEK  . ALPRAZolam (XANAX) 1 MG tablet TAKE 1 TABLET (1 MG TOTAL) BY MOUTH AT BEDTIME AS NEEDED FOR ANXIETY.  Marland Kitchen amLODipine (NORVASC) 5 MG tablet TAKE 1 TABLET BY MOUTH EVERY DAY  . apixaban (ELIQUIS) 2.5 MG TABS tablet Take 1 tablet (2.5 mg total) by mouth 2 (two) times daily.  Marland Kitchen docusate sodium (COLACE) 100 MG capsule Take 200 mg by mouth 2 (two) times daily as needed for moderate constipation.   . DULoxetine (CYMBALTA) 20 MG capsule Take 2 capsules (40 mg total) by mouth daily.  Marland Kitchen esomeprazole (NEXIUM) 40 MG capsule Take 1 capsule (40 mg total) by mouth daily.  . fluticasone (FLONASE) 50 MCG/ACT nasal spray Place 2 sprays into both nostrils daily.  . fluticasone (FLOVENT HFA) 220 MCG/ACT inhaler Inhale 1 puff into the lungs 2 (two) times daily.  Marland Kitchen LINZESS 290 MCG CAPS capsule TAKE 1 CAPSULE BY MOUTH EVERY DAY  . lovastatin (MEVACOR) 10 MG tablet TAKE 1 TABLET BY MOUTH EVERY DAY  . NARCAN 4 MG/0.1ML LIQD nasal spray kit   . ondansetron (ZOFRAN ODT) 4 MG disintegrating tablet Take 1 tablet (4 mg total) by mouth every 8 (eight) hours as needed for nausea or vomiting.  . Oxycodone HCl 10 MG TABS Take 10 mg by mouth 3 (three) times daily.  . polyethylene glycol (MIRALAX / GLYCOLAX) packet Take 17 g by mouth daily as needed (for constipation.).   Marland Kitchen pregabalin (LYRICA) 100 MG capsule TAKE 1 CAPSULE BY MOUTH TWICE A DAY  . promethazine (PHENERGAN) 12.5 MG tablet Take 1 tablet (12.5 mg total) by mouth every 12 (twelve) hours as needed for nausea or vomiting.  . traZODone (DESYREL) 50 MG tablet Take 0.5-1.5 tablets (25-75 mg total) by mouth at bedtime as needed for sleep.     Allergies:   Ampicillin, Ampicillin-sulbactam sodium, Ambien [zolpidem tartrate], Cyclobenzaprine,  Tape, Toradol [ketorolac tromethamine], Sulfamethoxazole-trimethoprim, Zolpidem, Cephalexin, and Tramadol   Social History   Socioeconomic History  . Marital status: Widowed    Spouse name: Not on file  . Number of children: 3  . Years of education: middle sch  . Highest education level: Not on file  Occupational History  . Occupation: retired    Comment: n/a  Tobacco Use  . Smoking status: Former Smoker    Packs/day: 1.50    Years: 30.00    Pack years: 45.00    Types: E-cigarettes, Cigarettes    Quit date: 2013    Years since quitting: 8.1  .  Smokeless tobacco: Never Used  Substance and Sexual Activity  . Alcohol use: No    Alcohol/week: 0.0 standard drinks  . Drug use: No  . Sexual activity: Not Currently  Other Topics Concern  . Not on file  Social History Narrative   ** Merged History Encounter **       Patient lives at home with daughter. Caffeine Use: 4 cups daily   Social Determinants of Health   Financial Resource Strain:   . Difficulty of Paying Living Expenses: Not on file  Food Insecurity:   . Worried About Charity fundraiser in the Last Year: Not on file  . Ran Out of Food in the Last Year: Not on file  Transportation Needs:   . Lack of Transportation (Medical): Not on file  . Lack of Transportation (Non-Medical): Not on file  Physical Activity:   . Days of Exercise per Week: Not on file  . Minutes of Exercise per Session: Not on file  Stress:   . Feeling of Stress : Not on file  Social Connections:   . Frequency of Communication with Friends and Family: Not on file  . Frequency of Social Gatherings with Friends and Family: Not on file  . Attends Religious Services: Not on file  . Active Member of Clubs or Organizations: Not on file  . Attends Archivist Meetings: Not on file  . Marital Status: Not on file     Family History: The patient's family history includes Cervical cancer in her daughter; Deep vein thrombosis in her sister and  son; Heart attack in her father; Heart failure in her mother; Lung cancer in her sister; Pulmonary embolism in her son.  ROS:   Please see the history of present illness.     All other systems reviewed and are negative.  EKGs/Labs/Other Studies Reviewed:    The following studies were reviewed today:   EKG:  EKG is  ordered today.  The ekg ordered today demonstrates normal sinus rhythm, sinus arrhythmia.  Recent Labs: 06/18/2019: ALT 22; BUN 10; Creatinine, Ser 1.08; Hemoglobin 13.6; Platelets 286; Potassium 4.6; Sodium 139  Recent Lipid Panel No results found for: CHOL, TRIG, HDL, CHOLHDL, VLDL, LDLCALC, LDLDIRECT  Physical Exam:    VS:  BP 128/88 (BP Location: Right Arm, Patient Position: Sitting, Cuff Size: Normal)   Pulse 89   Ht _0  (1.626 m)   Wt 170 lb 12 oz (77.5 kg)   SpO2 97%   BMI 29.31 kg/m     Wt Readings from Last 3 Encounters:  07/12/19 170 lb 12 oz (77.5 kg)  06/18/19 180 lb (81.6 kg)  05/19/19 173 lb (78.5 kg)     GEN:  Well nourished, well developed in no acute distress HEENT: Normal NECK: No JVD; No carotid bruits LYMPHATICS: No lymphadenopathy CARDIAC: RRR, no murmurs, rubs, gallops RESPIRATORY: Rhonchi at bases ABDOMEN: Soft, non-tender, non-distended MUSCULOSKELETAL:  No edema; No deformity  SKIN: Warm and dry NEUROLOGIC:  Alert and oriented x 3 PSYCHIATRIC:  Normal affect   ASSESSMENT:    1. Pre-operative cardiovascular examination   2. Chest pain, unspecified type   3. Essential hypertension    PLAN:    In order of problems listed above:  1. Patient with right hip pain, scheduled to have right hip surgery.  She has risk factors of hypertension, hyperlipidemia, former smoker.  Had an episode of atypical chest pain associated with elevated blood pressure.  Due to risk factors, will evaluate patient with  myocardial perfusion imaging stress test for the presence of any CAD/ischemia.  If stress test is normal, patient can proceed with surgery  as planned.  Last echocardiogram showed normal ejection fraction. 2. Patient symptoms of chest pain are atypical.  Due to risk factors, will proceed with stress test as above. 3. Blood pressure is well controlled.  Continue current blood pressure meds.  Low up after stress test.   Medication Adjustments/Labs and Tests Ordered: Current medicines are reviewed at length with the patient today.  Concerns regarding medicines are outlined above.  Orders Placed This Encounter  Procedures  . NM Myocar Multi W/Spect W/Wall Motion / EF  . EKG 12-Lead   No orders of the defined types were placed in this encounter.   Patient Instructions  Medication Instructions:  Your physician recommends that you continue on your current medications as directed. Please refer to the Current Medication list given to you today.  *If you need a refill on your cardiac medications before your next appointment, please call your pharmacy*  Lab Work: None ordered If you have labs (blood work) drawn today and your tests are completely normal, you will receive your results only by: Marland Kitchen MyChart Message (if you have MyChart) OR . A paper copy in the mail If you have any lab test that is abnormal or we need to change your treatment, we will call you to review the results.  Testing/Procedures: Your physician has requested that you have a lexiscan myoview. For further information please visit [email or web address]. Please follow instruction sheet, as given.    Follow-Up: At [facility], you and your health needs are our priority.  As part of our continuing mission to provide you with exceptional heart care, we have created designated Provider Care Teams.  These Care Teams include your primary Cardiologist (physician) and Advanced Practice Providers (APPs -  Physician Assistants and Nurse Practitioners) who all work together to provide you with the care you need, when you need it.  Your next appointment:   After testing     The format for your next appointment:   In Person  Provider:    You may see Kate Sable, MD or one of the following Advanced Practice Providers on your designated Care Team:    Murray Hodgkins, NP  Christell Faith, PA-C  Marrianne Mood, PA-C   Other Instructions [place]  Your caregiver has ordered a Stress Test with nuclear imaging. The purpose of this test is to evaluate the blood supply to your heart muscle. This procedure is referred to as a "Non-Invasive Stress Test." This is because other than having an IV started in your vein, nothing is inserted or "invades" your body. Cardiac stress tests are done to find areas of poor blood flow to the heart by determining the extent of coronary artery disease (CAD). Some patients exercise on a treadmill, which naturally increases the blood flow to your heart, while others who are  unable to walk on a treadmill due to physical limitations have a pharmacologic/chemical stress agent called Lexiscan . This medicine will mimic walking on a treadmill by temporarily increasing your coronary blood flow.   Please note: these test may take anywhere between 2-4 hours to complete  PLEASE REPORT TO [place] AT THE FIRST DESK WILL DIRECT YOU WHERE TO GO  Date of Procedure:_____________________________________  Arrival Time for Procedure:______________________________   PLEASE NOTIFY THE OFFICE AT LEAST 24 HOURS IN ADVANCE IF YOU ARE UNABLE TO KEEP YOUR  APPOINTMENT.  [phone] AND  PLEASE NOTIFY NUCLEAR MEDICINE AT [facility] AT LEAST 24 HOURS IN ADVANCE IF YOU ARE UNABLE TO KEEP YOUR APPOINTMENT. [phone]  How to prepare for your Myoview test:  1. Do not eat or drink after midnight 2. No caffeine for 24 hours prior to test 3. No smoking 24 hours prior to test. 4. Your medication may be taken with water.  If your doctor stopped a medication because of this test, do not take that medication. 5. Ladies,  please do not wear dresses.  Skirts or pants are appropriate. Please wear a short sleeve shirt. 6. No perfume, cologne or lotion. 7. Wear comfortable walking shoes. No heels!               Signed, Kate Sable, MD  07/12/2019 12:01 PM    [place]

## 2019-07-15 ENCOUNTER — Encounter: Payer: Self-pay | Admitting: Family Medicine

## 2019-07-15 ENCOUNTER — Other Ambulatory Visit: Payer: Self-pay

## 2019-07-15 ENCOUNTER — Emergency Department
Admission: EM | Admit: 2019-07-15 | Discharge: 2019-07-16 | Disposition: A | Discharge status: Home / Self Care | Payer: Medicare Other | Attending: Emergency Medicine | Admitting: Emergency Medicine

## 2019-07-15 ENCOUNTER — Emergency Department: Payer: Medicare Other

## 2019-07-15 DIAGNOSIS — N183 Chronic kidney disease, stage 3 unspecified: Secondary | ICD-10-CM | POA: Insufficient documentation

## 2019-07-15 DIAGNOSIS — R079 Chest pain, unspecified: Secondary | ICD-10-CM | POA: Insufficient documentation

## 2019-07-15 DIAGNOSIS — J449 Chronic obstructive pulmonary disease, unspecified: Secondary | ICD-10-CM | POA: Insufficient documentation

## 2019-07-15 DIAGNOSIS — Z85828 Personal history of other malignant neoplasm of skin: Secondary | ICD-10-CM | POA: Insufficient documentation

## 2019-07-15 DIAGNOSIS — Z7901 Long term (current) use of anticoagulants: Secondary | ICD-10-CM | POA: Insufficient documentation

## 2019-07-15 DIAGNOSIS — Z79899 Other long term (current) drug therapy: Secondary | ICD-10-CM | POA: Insufficient documentation

## 2019-07-15 DIAGNOSIS — Z96641 Presence of right artificial hip joint: Secondary | ICD-10-CM | POA: Diagnosis not present

## 2019-07-15 DIAGNOSIS — Z96651 Presence of right artificial knee joint: Secondary | ICD-10-CM | POA: Insufficient documentation

## 2019-07-15 DIAGNOSIS — Z87891 Personal history of nicotine dependence: Secondary | ICD-10-CM | POA: Diagnosis not present

## 2019-07-15 DIAGNOSIS — Z8673 Personal history of transient ischemic attack (TIA), and cerebral infarction without residual deficits: Secondary | ICD-10-CM | POA: Insufficient documentation

## 2019-07-15 DIAGNOSIS — I129 Hypertensive chronic kidney disease with stage 1 through stage 4 chronic kidney disease, or unspecified chronic kidney disease: Secondary | ICD-10-CM | POA: Diagnosis not present

## 2019-07-15 DIAGNOSIS — Z96652 Presence of left artificial knee joint: Secondary | ICD-10-CM | POA: Insufficient documentation

## 2019-07-15 DIAGNOSIS — R0602 Shortness of breath: Secondary | ICD-10-CM | POA: Diagnosis present

## 2019-07-15 LAB — COMPREHENSIVE METABOLIC PANEL
ALT: 20 U/L (ref 0–44)
AST: 28 U/L (ref 15–41)
Albumin: 3.4 g/dL — ABNORMAL LOW (ref 3.5–5.0)
Alkaline Phosphatase: 121 U/L (ref 38–126)
Anion gap: 7 (ref 5–15)
BUN: 8 mg/dL (ref 8–23)
CO2: 27 mmol/L (ref 22–32)
Calcium: 9.4 mg/dL (ref 8.9–10.3)
Chloride: 106 mmol/L (ref 98–111)
Creatinine, Ser: 0.78 mg/dL (ref 0.44–1.00)
GFR calc Af Amer: >60 mL/min (ref >60)
GFR calc non Af Amer: >60 mL/min (ref >60)
Glucose, Bld: 106 mg/dL — ABNORMAL HIGH (ref 70–99)
Potassium: 3.6 mmol/L (ref 3.5–5.1)
Sodium: 140 mmol/L (ref 135–145)
Total Bilirubin: 0.3 mg/dL (ref 0.3–1.2)
Total Protein: 8 g/dL (ref 6.5–8.1)

## 2019-07-15 LAB — CBC
HCT: 40.4 % (ref 36.0–46.0)
Hemoglobin: 12.5 g/dL (ref 12.0–15.0)
MCH: 26.7 pg (ref 26.0–34.0)
MCHC: 30.9 g/dL (ref 30.0–36.0)
MCV: 86.3 fL (ref 80.0–100.0)
Platelets: 215 10*3/uL (ref 150–400)
RBC: 4.68 MIL/uL (ref 3.87–5.11)
RDW: 16.4 % — ABNORMAL HIGH (ref 11.5–15.5)
WBC: 7 10*3/uL (ref 4.0–10.5)
nRBC: 0 % (ref 0.0–0.2)

## 2019-07-15 LAB — TROPONIN I (HIGH SENSITIVITY): Troponin I (High Sensitivity): 4 ng/L (ref <18)

## 2019-07-15 LAB — BRAIN NATRIURETIC PEPTIDE: B Natriuretic Peptide: 52 pg/mL (ref 0.0–100.0)

## 2019-07-15 MED ORDER — ALBUTEROL SULFATE (2.5 MG/3ML) 0.083% IN NEBU
5.0000 mg | INHALATION_SOLUTION | Freq: Once | RESPIRATORY_TRACT | Status: DC
  Filled 2019-07-15: qty 6

## 2019-07-15 MED ORDER — OXYCODONE HCL 5 MG PO TABS
10.0000 mg | ORAL_TABLET | Freq: Once | ORAL | Status: AC
  Administered 2019-07-15: 10 mg via ORAL
  Filled 2019-07-15: qty 2

## 2019-07-15 NOTE — ED Provider Notes (Signed)
[facility] Emergency Department Provider Note  Time seen: 9:42 PM  I have reviewed the triage vital signs and the nursing notes.   HISTORY  Chief Complaint Chest Pain   HPI [name] is a 79 y.o. female with a past medical history of COPD, depression, gastric reflux, hypertension, hyperlipidemia, presents to the emergency department for shortness of breath and chest pain.  According to the patient over the past 1 week or so she has been experiencing intermittent chest pain in the center of her chest.  States somewhat worse today.  Denies any pain at this time.  Patient also states intermittent shortness of breath with a history of COPD and this is fairly chronic for her denies any significant shortness of breath currently.  Patient denies any fever, does take cough but unchanged from baseline.  Does not wear oxygen.   Past Medical History:  Diagnosis Date  . Acute postoperative pain 02/03/2018  . Anemia   . B12 deficiency   . Bacteremia 10/07/2014  . Bladder incontinence   . Blind left eye   . Cancer ([place])    skin cancer   . Cataract   . Cervical spine fracture ([place])   . Chest pain 07/29/2013  . Compression fracture    C7,  upper T spine -compression fx  . COPD (chronic obstructive pulmonary disease) ([place])   . DDD (degenerative disc disease), lumbar   . Depression    major  . Diffuse myofascial pain syndrome 03/24/2015  . Dyspnea    at times- when activty  . Fever 10/05/2014  . GERD (gastroesophageal reflux disease)   . Hiatal hernia   . Hyperlipidemia   . Hypertension   . 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 Macular degeneration   . On home oxygen therapy    "3L; all the time" (04/30/2018)  . Osteoarthritis   . Osteoporosis   . Pneumonia    years ago  . Pulmonary embolism ([place]) 04/30/2018  . Reactive airway disease   . Rupture of bowel ([place])   . Sepsis ([place]) 10/08/2014  . Stroke [facility])    "mini stroke"- balance and memory  issue  . Stroke ([place])   . Wrist pain, acute 09/10/2012    Patient Active Problem List   Diagnosis Date Noted  . Non-intractable vomiting 05/19/2019  . Anemia 05/04/2019  . History of deep vein thrombosis of lower extremity 01/05/2019  . GI bleed 10/29/2018  . ILD (interstitial lung disease) ([place]) 08/12/2018  . COPD exacerbation ([place]) 05/17/2018  . Pressure injury of skin 05/04/2018  . PE (pulmonary thromboembolism) ([place]) 04/30/2018  . Acute metabolic encephalopathy   . Respiratory failure with hypoxia ([place]) 04/20/2018  . CAP (community acquired pneumonia) 04/20/2018  . Acute lower UTI 04/20/2018  . Chronic lower extremity pain (Right) 04/07/2018  . Abnormal MRI, lumbar spine 04/07/2018  . Lumbar facet hypertrophy (Multilevel) (Bilateral) 04/07/2018  . Lumbar foraminal stenosis (L3-4, L4-5) (Bilateral) 04/07/2018  . Annular tear of lumbar disc (L3-4, L4-5) 04/07/2018  . Closed hip fracture, sequela (Right) 02/03/2018  . It band syndrome, right 12/18/2017  . History of hip replacement (Right) 12/18/2017  . Ileus ([place]) 10/27/2017  . Primary osteoarthritis of hip 09/30/2017  . History of small bowel obstruction 09/10/2017  . 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 SBO (small bowel obstruction) ([place]) 08/24/2017  . Abnormal x-ray of pelvis 08/13/2017  . Other specified dorsopathies, sacral and sacrococcygeal region 08/04/2017  . Spondylosis without  myelopathy or radiculopathy, lumbosacral region 07/17/2017  . Non-traumatic compression fracture of T3 thoracic vertebra, sequela 07/02/2017  . High risk medication use 07/02/2017  . At high risk for falls 07/02/2017  . Chronic sacroiliac joint pain (Right) 07/02/2017  . CKD (chronic kidney disease) stage 3, GFR 30-59 ml/min 04/23/2017  . Chronic knee pain s/p total knee replacement (TKR) (Right) 04/02/2017  . DDD (degenerative disc disease), lumbar 03/05/2017  . Chronic knee pain (Bilateral) (R>L) 03/05/2017  .  Osteoarthritis 03/05/2017  . Chronic musculoskeletal pain 03/05/2017  . Neurogenic pain 03/05/2017  . Chronic myofascial pain 02/12/2017  . Lumbar facet syndrome (Bilateral) (L>R) 02/12/2017  . Long term prescription benzodiazepine use 02/12/2017  . Disorder of skeletal system 02/12/2017  . Pharmacologic therapy 02/12/2017  . Problems influencing health status 02/12/2017  . 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 NSAID long-term use 02/12/2017  . DDD (degenerative disc disease), cervical 11/11/2016  . Lumbar facet arthropathy (Bilateral) 11/11/2016  . 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 CAFL (chronic airflow limitation) ([place]) 11/07/2016  . Depression, major, in partial remission ([place]) 11/07/2016  . 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 Long term current use of opiate analgesic 11/07/2016  . Long term prescription opiate use 11/07/2016  . Chronic pain syndrome 11/07/2016  . Chronic neck pain (Secondary Area of Pain) (Bilateral) (L>R) 11/07/2016  . Chronic hip pain (Right) 11/07/2016  . Age-related osteoporosis with current pathological fracture with routine healing 09/20/2016  . History of stroke 09/20/2016  . Intertrochanteric fracture of femur, sequela (Right) 09/13/2016  . Chronic shoulder pain (Bilateral) 08/12/2016  . Hx of total knee replacement (Bilateral) 04/04/2016  . Chronic shoulder pain (Left) 02/28/2016  . Hoarseness 01/12/2016  . Pharyngoesophageal dysphagia 01/12/2016  . Chronic low back pain [facility] Area of Pain) (Bilateral) (L>R) 10/26/2015  . 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 Syncope, non cardiac 05/22/2015  . Thrombocytopenia ([place]) 10/07/2014  . Hypoxia 10/05/2014  . Anxiety 06/15/2014  . Duodenogastric reflux 06/15/2014  . Benign essential HTN 06/15/2014  . Hypercholesteremia 07/30/2013  . HTN (hypertension) 07/30/2013  . GERD (gastroesophageal reflux  disease) 07/30/2013  . Memory loss 05/03/2013  . Chronic knee pain (Primary Area of Pain) (Right) 09/10/2012    Past Surgical History:  Procedure Laterality Date  . ABDOMINAL HYSTERECTOMY    . ABDOMINAL SURGERY    . APPENDECTOMY    . BLADDER SURGERY    . CATARACT EXTRACTION W/ INTRAOCULAR LENS  IMPLANT, BILATERAL Bilateral   . CHOLECYSTECTOMY    . COLON SURGERY    . COLOSTOMY REVERSAL    . ESOPHAGOGASTRODUODENOSCOPY N/A 10/30/2018   Procedure: ESOPHAGOGASTRODUODENOSCOPY (EGD);  Surgeon: Lin Landsman, MD;  Location: [facility] ENDOSCOPY;  Service: Gastroenterology;  Laterality: N/A;  . INTRAMEDULLARY (IM) NAIL INTERTROCHANTERIC Right 09/13/2016   Procedure: INTRAMEDULLARY (IM) NAIL INTERTROCHANTRIC RIGHT;  Surgeon: Leandrew Koyanagi, MD;  Location: [facility];  Service: Orthopedics;  Laterality: Right;  . JOINT REPLACEMENT Bilateral   . KNEE SURGERY    . OSTOMY    . RECTOCELE REPAIR    . TOE SURGERY Right    3rd  . TOTAL HIP ARTHROPLASTY Right 09/30/2017   Procedure: RIGHT HIP IM NAIL REMOVAL WITH CONVERSION TO TOTAL HIP ARTHOPLASTY, anterior approach;  Surgeon: Renette Butters, MD;  Location: [place];  Service: Orthopedics;  Laterality: Right;    Prior to Admission medications   Medication Sig Start Date End Date Taking? Authorizing Provider  acetaminophen (  TYLENOL) 500 MG tablet Take 2 tablets (1,000 mg total) by mouth 3 (three) times daily. Patient taking differently: Take 1,000 mg by mouth 2 (two) times daily.  08/25/18   Ria Bush, MD  albuterol (ACCUNEB) 1.25 MG/3ML nebulizer solution Take 3 mLs (1.25 mg total) by nebulization every 6 (six) hours as needed for wheezing. 08/26/18   [name], FNP  albuterol (VENTOLIN HFA) 108 (90 Base) MCG/ACT inhaler Inhale 2 puffs into the lungs every 6 (six) hours as needed for wheezing or shortness of breath. 09/25/18   [name], FNP  alendronate (FOSAMAX) 70 MG tablet TAKE 1 TABLET BY MOUTH ONCE A WEEK 06/09/19   [name],  FNP  ALPRAZolam (XANAX) 1 MG tablet TAKE 1 TABLET (1 MG TOTAL) BY MOUTH AT BEDTIME AS NEEDED FOR ANXIETY. 06/11/19   [name], FNP  amLODipine (NORVASC) 5 MG tablet TAKE 1 TABLET BY MOUTH EVERY DAY 03/29/19   [name], FNP  apixaban (ELIQUIS) 2.5 MG TABS tablet Take 1 tablet (2.5 mg total) by mouth 2 (two) times daily. 05/04/19   Earlie Server, MD  docusate sodium (COLACE) 100 MG capsule Take 200 mg by mouth 2 (two) times daily as needed for moderate constipation.     [provider]  DULoxetine (CYMBALTA) 20 MG capsule Take 2 capsules (40 mg total) by mouth daily. 04/30/19   [name], FNP  esomeprazole (NEXIUM) 40 MG capsule Take 1 capsule (40 mg total) by mouth daily. 03/24/19   [name], FNP  fluticasone (FLONASE) 50 MCG/ACT nasal spray Place 2 sprays into both nostrils daily. 03/08/19   [name], FNP  fluticasone (FLOVENT HFA) 220 MCG/ACT inhaler Inhale 1 puff into the lungs 2 (two) times daily. 09/25/18   [name], FNP  LINZESS 290 MCG CAPS capsule TAKE 1 CAPSULE BY MOUTH EVERY DAY 02/26/19   [name], FNP  lovastatin (MEVACOR) 10 MG tablet TAKE 1 TABLET BY MOUTH EVERY DAY 06/23/19   [name], FNP  NARCAN 4 MG/0.1ML LIQD nasal spray kit  09/10/18   [provider]  ondansetron (ZOFRAN ODT) 4 MG disintegrating tablet Take 1 tablet (4 mg total) by mouth every 8 (eight) hours as needed for nausea or vomiting. 06/12/18   [name], FNP  Oxycodone HCl 10 MG TABS Take 10 mg by mouth 3 (three) times daily. 09/23/18   [provider]  polyethylene glycol (MIRALAX / GLYCOLAX) packet Take 17 g by mouth daily as needed (for constipation.).     [provider]  pregabalin (LYRICA) 100 MG capsule TAKE 1 CAPSULE BY MOUTH TWICE A DAY 06/11/19   [name], FNP  promethazine (PHENERGAN) 12.5 MG tablet Take 1 tablet (12.5 mg total) by mouth every 12 (twelve) hours as needed for nausea or vomiting.  05/19/19   [name], FNP  traZODone (DESYREL) 50 MG tablet Take 0.5-1.5 tablets (25-75 mg total) by mouth at bedtime as needed for sleep. 06/25/19   [name], FNP    Allergies  Allergen Reactions  . Ampicillin Anaphylaxis, Nausea Only and Swelling    SEVERE HEADACHE MUSCLE CRAMPS ANGIOEDEMA THRUSH PATIENT HAS HAD A PCN REACTION WITH IMMEDIATE RASH, FACIAL/TONGUE/THROAT SWELLING, SOB, OR LIGHTHEADEDNESS WITH HYPOTENSION:  #  #  YES  #  #  Has patient had a PCN reaction causing severe rash involving mucus membranes or skin necrosis: No Has patient had a PCN reaction that required hospitalization:No Has  patient had a PCN reaction occurring within the last 10 years: No.  . Ampicillin-Sulbactam Sodium Anaphylaxis and Other (See Comments)    SEVERE HEADACHE MUSCLE CRAMPS ANGIOEDEMA THRUSH PATIENT HAS HAD A PCN REACTION WITH IMMEDIATE RASH, FACIAL/TONGUE/THROAT SWELLING, SOB, OR LIGHTHEADEDNESS WITH HYPOTENSION: # # YES # # Has patient had a PCN reaction causing severe rash involving mucus membranes or skin necrosis: No Has patient had a PCN reaction that required hospitalization:No Has patient had a PCN reaction occurring within the last 10 years: No  SEVERE HEADACHE MUSCLE CRAMPS ANGIOEDEMA THRUSH PATIENT HAS HAD A PCN REACTION WITH IMMEDIATE RASH, FACIAL/TONGUE/THROAT SWELLING, SOB, OR LIGHTHEADEDNESS WITH HYPOTENSION: # # YES # # Has patient had a PCN reaction causing severe rash involving mucus membranes or skin necrosis: No Has patient had a PCN reaction that required hospitalization:No Has patient had a PCN reaction occurring within the last 10 years: No Muscles draw up tight - severe headache angioedema  . Ambien [Zolpidem Tartrate] Nausea And Vomiting and Other (See Comments)    HALLUCINATIONS SWEATING  . Cyclobenzaprine Other (See Comments)    EXTRAPYRAMIDAL MOVEMENT INVOLUNTARY MUSCLE JERKING EXTRAPYRAMIDAL MOVEMENT INVOLUNTARY MUSCLE JERKING Gets the  jerks  . Tape Itching, Rash and Other (See Comments)    Paper tape only. Adhesive tape=itching/burning/rash  . Toradol [Ketorolac Tromethamine] Nausea Only and Other (See Comments)    HEADACHE  BACKACHE  . Sulfamethoxazole-Trimethoprim     UNSPECIFIED REACTION   . Zolpidem     Hallucinations  . Cephalexin Rash and Other (See Comments)    HEADACHES  . Tramadol Rash and Other (See Comments)    HEADACHE     Family History  Problem Relation Age of Onset  . Heart failure Mother   . Heart attack Father   . Lung cancer Sister   . Deep vein thrombosis Sister   . Pulmonary embolism Son   . Deep vein thrombosis Son   . Cervical cancer Daughter     Social History Social History   Tobacco Use  . Smoking status: Former Smoker    Packs/day: 1.50    Years: 30.00    Pack years: 45.00    Types: E-cigarettes, Cigarettes    Quit date: 2013    Years since quitting: 8.1  . Smokeless tobacco: Never Used  Substance Use Topics  . Alcohol use: No    Alcohol/week: 0.0 standard drinks  . Drug use: No    Review of Systems Constitutional: Negative for fever. Cardiovascular: Intermittent chest pain, none currently. Respiratory: Intermittent shortness of breath, chronic. Gastrointestinal: Negative for abdominal pain Musculoskeletal: Negative for musculoskeletal complaints Neurological: Negative for headache All other ROS negative  ____________________________________________   PHYSICAL EXAM:  VITAL SIGNS: ED Triage Vitals  Enc Vitals Group     BP 07/15/19 2137 (!) 181/88     Pulse Rate 07/15/19 2137 84     Resp 07/15/19 2137 20     Temp 07/15/19 2137 98.3 F (36.8 C)     Temp Source 07/15/19 2137 Oral     SpO2 07/15/19 2137 96 %     Weight 07/15/19 2139 170 lb (77.1 kg)     Height 07/15/19 2139 '5\' 4"'  (1.626 m)     Head Circumference --      Peak Flow --      Pain Score 07/15/19 2138 7     Pain Loc --      Pain Edu? --      Excl. in [place]? --    Constitutional:  Alert and  oriented. Well appearing and in no distress. Eyes: Normal exam ENT      Head: Normocephalic and atraumatic.      Mouth/Throat: Mucous membranes are moist. Cardiovascular: Normal rate, regular rhythm.  Respiratory: Normal respiratory effort without tachypnea nor retractions. Breath sounds are clear  Gastrointestinal: Soft and nontender. No distention.  Musculoskeletal: Nontender with normal range of motion in all extremities. No lower extremity tenderness or edema. Neurologic:  Normal speech and language. No gross focal neurologic deficits  Skin:  Skin is warm, dry and intact.  Psychiatric: Mood and affect are normal.   ____________________________________________    EKG  EKG viewed and interpreted by myself shows a sinus rhythm at 86 bpm with a narrow QRS, normal axis, normal intervals, nonspecific ST changes.  ____________________________________________    RADIOLOGY  Chest x-ray shows pulmonary fibrosis.  No new changes.  ____________________________________________   INITIAL IMPRESSION / ASSESSMENT AND PLAN / ED COURSE  Pertinent labs & imaging results that were available during my care of the patient were reviewed by me and considered in my medical decision making (see chart for details).   Patient presents to the emergency department for intermittent chest pain over the past 1 week with intermittent shortness of breath.  Denies any chest pain or shortness of breath currently.  We will check labs, chest x-ray, EKG and continue to closely monitor.  Patient agreeable to plan of care.  Patient's troponin is negative labs are reassuring, chest x-ray is reassuring.  We will obtain a repeat troponin at 2 hours, if repeat troponin remains negative anticipate likely discharge home.  Patient care signed out to oncoming physician.  EVADENE WARDRIP was evaluated in Emergency Department on 07/15/2019 for the symptoms described in the history of present illness. She was evaluated in the  context of the global COVID-19 pandemic, which necessitated consideration that the patient might be at risk for infection with the SARS-CoV-2 virus that causes COVID-19. Institutional protocols and algorithms that pertain to the evaluation of patients at risk for COVID-19 are in a state of rapid change based on information released by regulatory bodies including the CDC and federal and state organizations. These policies and algorithms were followed during the patient's care in the ED.  ____________________________________________   FINAL CLINICAL IMPRESSION(S) / ED DIAGNOSES  Chest pain   Harvest Dark, MD 07/15/19 2235

## 2019-07-15 NOTE — ED Triage Notes (Signed)
PT to ED via EMS from home. C/o on and off CP for last couple weeks, increasing sharp pains in chest with SOB. HX of blood clots. PT does take eiquis. HX of COPD, pt satting 97% room air. PT has been using her inhaler at home. PT speaking in full sentences.

## 2019-07-16 ENCOUNTER — Telehealth: Payer: Self-pay | Admitting: Obstetrics & Gynecology

## 2019-07-16 ENCOUNTER — Telehealth: Payer: Self-pay

## 2019-07-16 LAB — TROPONIN I (HIGH SENSITIVITY): Troponin I (High Sensitivity): 5 ng/L (ref <18)

## 2019-07-16 NOTE — Telephone Encounter (Signed)
[place] Night - Client TELEPHONE ADVICE RECORD [facility] Patient Name: [name] Gender: Unknown DOB: 1940-08-14 Age: 79 Y 11 M 10 D Return Phone Number: [phone] (Primary) Address: City/State/Zip: [place] [address] Client [facility] Night - Client Client Site [facility] Physician [name]- NP Contact Type Call Who Is Calling Patient / Member / Family / Caregiver Call Type Triage / Clinical Caller Name [name] Relationship To Patient Daughter Return Phone Number [phone] (Primary) Chief Complaint CHEST PAIN (>=21 years) - pain, pressure, heaviness or tightness Reason for Call Symptomatic / Request for [place] states her mom needs to go to the hospital. Her blood pressure is very high 162/104 and heart rate was 93. She also has chest pain and tightness. She states it is stinging and burning Translation No Nurse Assessment Nurse: Durene Cal, RN, Brandi Date/Time (Eastern Time): 07/15/2019 8:18:58 PM Confirm and document reason for call. If symptomatic, describe symptoms. ---Caller states her mom needs to go to the hospital. Her blood pressure is very high 162/104 and heart rate was 93. She also has chest pain and tightness. She states it is stinging and burning. Has the patient had close contact with a person known or suspected to have the novel coronavirus illness OR traveled / lives in area with major community spread (including international travel) in the last 14 days from the onset of symptoms? * If Asymptomatic, screen for exposure and travel within the last 14 days. ---No Does the patient have any new or worsening symptoms? ---Yes Will a triage be completed? ---Yes Related visit to physician within the last 2 weeks? ---N/A Does the PT have any chronic conditions? (i.e. diabetes, asthma, this includes High risk factors for pregnancy,  etc.) ---Unknown Is this a behavioral health or substance abuse call? ---No Guidelines Guideline Title Affirmed Question Affirmed Notes Nurse Date/Time [name] Time) Chest Pain [1] Chest pain lasts > 5 minutes AND [2] age > 77 Durene Cal, RN, Brandi 07/15/2019 8:21:14 PM PLEASE NOTE: All timestamps contained within this report are represented as [place] Standard Time. CONFIDENTIALTY NOTICE: This fax transmission is intended only for the addressee. It contains information that is legally privileged, confidential or otherwise protected from use or disclosure. If you are not the intended recipient, you are strictly prohibited from reviewing, disclosing, copying using or disseminating any of this information or taking any action in reliance on or regarding this information. If you have received this fax in error, please notify us immediately by telephone so that we can arrange for its return to [place]. Phone: [phone], Toll-Free: [phone], Fax: [phone] Page: 2 of 2 Call Id: [id number] [place]. Time [name] Time) Disposition Final User 07/15/2019 8:17:50 PM Send to Urgent [place] 07/15/2019 8:25:25 PM 911 Outcome Documentation Durene Cal, RN, Brandi Reason: No answer; left voice mail. 07/15/2019 8:21:31 PM Call EMS 911 Now Yes Durene Cal, RN, Velna Hatchet Caller Disagree/Comply Comply Caller Understands Yes PreDisposition InappropriateToAsk Care Advice Given Per Guideline CALL EMS 911 NOW: * Immediate medical attention is needed. You need to hang up and call 911 (or an ambulance). * Triager Discretion: I'll call you back in a few minutes to be sure you were able to reach them. Referrals GO TO FACILITY UNDECIDED

## 2019-07-16 NOTE — Telephone Encounter (Signed)
[facility] referring for Female genital prolapse, . Called and left voicemail for patient to call back to be schedule

## 2019-07-16 NOTE — Telephone Encounter (Signed)
Noted. ER encounter note reviewed.

## 2019-07-16 NOTE — Telephone Encounter (Signed)
Pt was in the ER yesterday. Note in chart

## 2019-07-16 NOTE — ED Provider Notes (Signed)
-----------------------------------------   12:27 AM on 07/16/2019 -----------------------------------------   Blood pressure (!) 162/84, pulse 87, temperature 98.3 F (36.8 C), temperature source Oral, resp. rate (!) 22, height 5\' 4"  (1.626 m), weight 77.1 kg, SpO2 97 %.  Assuming care from Dr. Kerman Passey of [name] is a 79 y.o. female with a chief complaint of Chest Pain .    Please refer to H&P by previous MD for further details.  The current plan of care is to f/u repeat troponin and if negative patient is clear for discharge home.   Repeat troponin is negative.  Patient remains chest pain-free.  She received her evening dose of albuterol here per her request.  Recommended close follow-up with PCP and discussed my standard return precautions.        Alfred Levins, [place], MD 07/16/19 Shelah Lewandowsky

## 2019-07-17 ENCOUNTER — Ambulatory Visit: Payer: Medicare Other | Attending: Internal Medicine

## 2019-07-17 DIAGNOSIS — Z23 Encounter for immunization: Secondary | ICD-10-CM | POA: Insufficient documentation

## 2019-07-17 NOTE — Progress Notes (Signed)
   Covid-19 Vaccination Clinic  Name:  [name]    MRN: [id number] DOB: [birth year]  07/17/2019  [name] was observed post Covid-19 immunization for 15 minutes without incidence. She was provided with Vaccine Information Sheet and instruction to access the V-Safe system.   [name] was instructed to call 911 with any severe reactions post vaccine: Marland Kitchen Difficulty breathing  . Swelling of your face and throat  . A fast heartbeat  . A bad rash all over your body  . Dizziness and weakness    Immunizations Administered    Name Date Dose VIS Date Route   Pfizer COVID-19 Vaccine 07/17/2019 11:18 AM 0.3 mL 05/07/2019 Intramuscular   Manufacturer: [place]   Lot: [id number]   [place]: [id number]

## 2019-07-19 ENCOUNTER — Encounter: Payer: Medicare Other | Admitting: Obstetrics & Gynecology

## 2019-07-19 ENCOUNTER — Encounter: Payer: Self-pay | Admitting: Family Medicine

## 2019-07-21 ENCOUNTER — Other Ambulatory Visit: Payer: Self-pay | Admitting: Family Medicine

## 2019-07-21 ENCOUNTER — Institutional Professional Consult (permissible substitution): Payer: Medicare Other | Admitting: Internal Medicine

## 2019-07-21 ENCOUNTER — Encounter: Payer: Self-pay | Admitting: Family Medicine

## 2019-07-21 DIAGNOSIS — R112 Nausea with vomiting, unspecified: Secondary | ICD-10-CM

## 2019-07-21 MED ORDER — PROMETHAZINE HCL 12.5 MG PO TABS: 12.5000 mg | ORAL_TABLET | Freq: Two times a day (BID) | ORAL | 0 refills | Status: DC | PRN

## 2019-07-22 ENCOUNTER — Other Ambulatory Visit: Payer: Medicare Other | Admitting: Adult Health Nurse Practitioner

## 2019-07-22 ENCOUNTER — Other Ambulatory Visit: Payer: Self-pay

## 2019-07-22 ENCOUNTER — Encounter
Admission: RE | Admit: 2019-07-22 | Discharge: 2019-07-22 | Disposition: A | Discharge status: Home / Self Care | Payer: Medicare Other | Source: Ambulatory Visit | Attending: Cardiology | Admitting: Cardiology

## 2019-07-22 DIAGNOSIS — R079 Chest pain, unspecified: Secondary | ICD-10-CM | POA: Insufficient documentation

## 2019-07-22 DIAGNOSIS — Z01810 Encounter for preprocedural cardiovascular examination: Secondary | ICD-10-CM | POA: Insufficient documentation

## 2019-07-22 MED ORDER — TECHNETIUM TC 99M TETROFOSMIN IV KIT
10.2700 | PACK | Freq: Once | INTRAVENOUS | Status: AC | PRN
  Administered 2019-07-22: 10.27 via INTRAVENOUS

## 2019-07-22 MED ORDER — TECHNETIUM TC 99M TETROFOSMIN IV KIT
29.5700 | PACK | Freq: Once | INTRAVENOUS | Status: AC | PRN
  Administered 2019-07-22: 29.57 via INTRAVENOUS

## 2019-07-22 MED ORDER — REGADENOSON 0.4 MG/5ML IV SOLN
0.4000 mg | Freq: Once | INTRAVENOUS | Status: AC
  Administered 2019-07-22: 0.4 mg via INTRAVENOUS

## 2019-07-23 ENCOUNTER — Ambulatory Visit (INDEPENDENT_AMBULATORY_CARE_PROVIDER_SITE_OTHER): Payer: Medicare Other | Admitting: Pulmonary Disease

## 2019-07-23 ENCOUNTER — Encounter: Payer: Self-pay | Admitting: Pulmonary Disease

## 2019-07-23 ENCOUNTER — Encounter: Payer: Self-pay | Admitting: Oncology

## 2019-07-23 VITALS — BP 118/82 | HR 116 | Temp 97.7°F | Ht 64.0 in | Wt 171.2 lb

## 2019-07-23 DIAGNOSIS — J849 Interstitial pulmonary disease, unspecified: Secondary | ICD-10-CM | POA: Diagnosis not present

## 2019-07-23 DIAGNOSIS — Z86711 Personal history of pulmonary embolism: Secondary | ICD-10-CM | POA: Diagnosis not present

## 2019-07-23 DIAGNOSIS — J449 Chronic obstructive pulmonary disease, unspecified: Secondary | ICD-10-CM | POA: Diagnosis not present

## 2019-07-23 LAB — NM MYOCAR MULTI W/SPECT W/WALL MOTION / EF
Estimated workload: 1 METS
Exercise duration (min): 0 min
Exercise duration (sec): 0 s
MPHR: 143 {beats}/min
Peak HR: 118 {beats}/min
Percent HR: 82 %
Rest HR: 98 {beats}/min

## 2019-07-23 NOTE — Patient Instructions (Signed)
Recommend that Dr. Tasia Catchings weigh in with regards to whether your anticoagulation could be held. She has been following this issue and has more perspective on that.  From the lung standpoint your risk for the procedure is what we call low to moderate this is because you haven't had any exacerbations and have not had any significant shortness of breath. I do recommend that you stay on your Flovent twice a day regularly and use your albuterol as needed. You have a combination of what appears to be COPD and lung fibrosis however there is no assessment of your lung function previously on your chart and currently breathing tests are limited due to COVID-19. However it appears that you have been stable in this regard. The best that I can make is that you are as noted low to moderate risk.   We will see you here in follow-up in 3 months time. Call sooner should any new difficulties arise

## 2019-07-23 NOTE — Progress Notes (Signed)
Subjective:    Patient ID: [name], female    DOB: 01-05-1941, 79 y.o.   MRN: [id number]  HPI The patient is a 79 year old very complex former smoker (quit 10 years) who is referred here for a preoperative evaluation.  She is kindly referred by Clarene Reamer, FNP.  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  Apparently the patient started having pain on this prosthesis in 2018 after a fall.  He notes worsening of pain and cannot bear walking.  Is not a reliable historian.  She presents with her daughter who cannot give details of her past medical history.  Patient has apparently had a PE in December 2019.  There is also description of a DVT which the patient cannot tell me which lower extremity and I cannot find supporting imaging to document laterality.  She has been on Eliquis 2.5 mg twice a day.  She has been followed for her hypercoagulability issues by Dr. Earlie Server at the [facility] who has seen her since her initial event.  A proposed procedure is supposed to be performed at [facility].  The patient's daughter believes that I am to render an opinion with regards to the patient's anticoagulation.  There is also concerned that the patient may have significant COPD.  Currently the patient is not using any inhalers regularly.  She uses as needed albuterol and is supposed to be on Flovent.  States that she has not been using these at all.  She had been on oxygen supplementation to her her PE in 2019.  Complain of shortness of breath however she does not do any significant amount of walking to experience shortness of breath.  She does not describe any wheezing.  She has not had any recent cough, fevers, chills or sweats.  No hemoptysis.  No sputum production.  He does not describe orthopnea nor paroxysmal nocturnal dyspnea.  To her knowledge she does not have any PFTs done previously and I have corroborated this looking at our records and care  everywhere records and cannot find PFT data.  She has not had any admissions for COPD exacerbation.  He did have COPD exacerbation [place] 2020 treated as an outpatient.  With a prednisone taper and doxycycline at that time.  Patient apparently has had prior issues postoperatively that are related to ileus and fecal impaction.  It did not appear that she had respiratory issues postoperatively.  These were after her hip replacement in 2018.  Patient is currently very sedentary.  I have reviewed her past medical history, surgical history, and family history.  They are as noted. Social history she is a former smoker smoking at age 56 smoked 1-1/2 packs of cigarettes per day and quit 10 years ago.  She is also being evaluated by cardiology with regards to cardiac risk assessment.   Review of Systems A 10 point review of systems was performed and it is as noted above otherwise negative.    Objective:   Physical Exam BP 118/82 (BP Location: Right Arm, Patient Position: Sitting, Cuff Size: Large)   Pulse (!) 116   Temp 97.7 F (36.5 C) (Temporal)   Ht 5\' 4"  (1.626 m)   Wt 171 lb 3.2 oz (77.7 kg)   SpO2 98% Comment: on ra  BMI 29.39 kg/m  GENERAL: Chronically ill-appearing woman, somewhat befuddled, presents in wheelchair.  Overweight. HEAD: Normocephalic, atraumatic.  EYES: Pupils equal, round, reactive to light.  No scleral icterus.  Ectropion OU. MOUTH: Nose/mouth/throat not examined due to masking requirements for COVID 19. NECK: Supple. No thyromegaly. No nodules. No JVD.  Trachea midline. PULMONARY: Coarse breath sounds throughout, no other adventitious sounds. CARDIOVASCULAR: S1 and S2.  Tachycardic, regular rate, no rubs murmurs or gallops appreciated. GASTROINTESTINAL: Protuberant, nondistended, soft. MUSCULOSKELETAL: No joint deformity, no clubbing, no edema.  NEUROLOGIC: Limited assessment due to patient being on wheelchair.  There appears to be no focality.  Mild psychomotor  retardation. SKIN: Intact,warm,dry.  Poor skin turgor, no rashes on limited skin exam. PSYCH: Labile mood at times.  Reviewed chest x-ray performed on 18 June 2019: Chronic fibrotic changes noted.     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  Defer to Dr. Tasia Catchings for recommendations for bridging versus holding anti-coagulation noting of course, that the patient will be on anticoagulation postop  May benefit from bilateral Dopplers of the lower extremities to exclude DVT presently   Debility and frailty Early mobilization and physical therapy   Preoperative pulmonary examination  Recommendations as above Cardiac risk assessment per cardiology   Follow-up in 3 months time or as needed  C. Derrill Kay, MD [place]  *This note was dictated using voice recognition software/Dragon.  Despite best efforts to proofread, errors can occur which can change the meaning.  Any change was purely unintentional.

## 2019-07-27 ENCOUNTER — Telehealth: Payer: Self-pay

## 2019-07-27 ENCOUNTER — Ambulatory Visit: Payer: Medicare Other | Admitting: Cardiology

## 2019-07-27 NOTE — Telephone Encounter (Signed)
Late entry: contacted pts daughter on 2/26 and asked her to contact [facility]. [facility] orthopedic and have them fax the clearance form to [place] and Dr. Tasia Catchings will review and sign if appropriate. No need for patient to have a visit with Dr. Phill Myron agreed and cancer center fax number was provided to her.

## 2019-07-28 ENCOUNTER — Other Ambulatory Visit: Payer: Medicare Other | Admitting: Adult Health Nurse Practitioner

## 2019-07-28 ENCOUNTER — Other Ambulatory Visit: Payer: Self-pay

## 2019-07-29 ENCOUNTER — Other Ambulatory Visit: Payer: Self-pay

## 2019-07-29 ENCOUNTER — Telehealth: Payer: Self-pay | Admitting: Adult Health Nurse Practitioner

## 2019-07-29 ENCOUNTER — Ambulatory Visit (INDEPENDENT_AMBULATORY_CARE_PROVIDER_SITE_OTHER): Payer: Medicare Other | Admitting: Family Medicine

## 2019-07-29 ENCOUNTER — Encounter: Payer: Self-pay | Admitting: Family Medicine

## 2019-07-29 VITALS — BP 140/86 | HR 102 | Temp 98.0°F | Ht 64.0 in | Wt 172.6 lb

## 2019-07-29 DIAGNOSIS — N3000 Acute cystitis without hematuria: Secondary | ICD-10-CM | POA: Diagnosis not present

## 2019-07-29 DIAGNOSIS — R300 Dysuria: Secondary | ICD-10-CM

## 2019-07-29 LAB — POC URINALSYSI DIPSTICK (AUTOMATED)
Bilirubin, UA: NEGATIVE
Glucose, UA: NEGATIVE
Ketones, UA: NEGATIVE
Nitrite, UA: NEGATIVE
Protein, UA: POSITIVE — AB
Spec Grav, UA: 1.02 (ref 1.010–1.025)
Urobilinogen, UA: 1 E.U./dL
pH, UA: 6 (ref 5.0–8.0)

## 2019-07-29 MED ORDER — CIPROFLOXACIN HCL 250 MG PO TABS: 250.0000 mg | ORAL_TABLET | Freq: Two times a day (BID) | ORAL | 0 refills | Status: DC

## 2019-07-29 NOTE — Telephone Encounter (Signed)
Rec'd message yesterday that daughter, [name], had called and wanted to cancel the 1 PM Palliative f/u visit.  I called daughter today to see if she would like to reschedule the visit and she declined.  Daughter wanted to wait to reschedule visit after patient's scheduled surgery planned for 3/8.  Daughter will call back to reschedule.

## 2019-07-29 NOTE — Assessment & Plan Note (Signed)
UA and story suspicious for recurrent UTI.  Given several antibiotic allergies, will treat with cipro 250mg  7d course. rec push fluids and rest.  Consider uro eval for recurrent UTIs (4th in the past year).

## 2019-07-29 NOTE — Progress Notes (Signed)
This visit was conducted in person.  BP 140/86 (BP Location: Left Arm, Patient Position: Sitting, Cuff Size: Normal)   Pulse (!) 102   Temp 98 F (36.7 C) (Temporal)   Ht '5\' 4"'  (1.626 m)   Wt 172 lb 9 oz (78.3 kg)   SpO2 97%   BMI 29.62 kg/m    CC: UTI  Subjective:    Patient ID: [name], female    DOB: 1940/12/08, 79 y.o.   MRN: [id number]  HPI: [name] is a 79 y.o. female presenting on 07/29/2019 for Dysuria (C/o burning with urination.  Started about 1 wk ago.  Pt accompanied by daughter, [name]- temp 97.9.)   1 wk h/o dysuria, incomplete emptying.  No urgency or frequency of urination or blood in urine.  Endorses chronic nausea and vomiting.  Denies fevers/chills, abd pain, flank pain, nausea/vomiting.   Tends to get recurrent UTIs, last infection 3 wks ago.  She has been treating with Azo at home.   She was seen at ER 05/2019 - treated for presumed pyelonephritis with levaquin 777m 5d course with benefit. UA abnormal, UCx not done. CT without kidney stone.   Prior to this had UTI 03/2019 (pansensitive E coli) treated with cipro 7d course. And prior UTI 11/2018 grew Klebsiella.   Upcoming R hip revision on Monday 08/02/2019.  Multiple drug allergies - PCN, sulfa (yeast infection in mouth), and keflex (HA, rash).      Relevant past medical, surgical, family and social history reviewed and updated as indicated. Interim medical history since our last visit reviewed. Allergies and medications reviewed and updated. Outpatient Medications Prior to Visit  Medication Sig Dispense Refill  . acetaminophen (TYLENOL) 500 MG tablet Take 2 tablets (1,000 mg total) by mouth 3 (three) times daily. (Patient taking differently: Take 1,000 mg by mouth 2 (two) times daily. ) 30 tablet 0  . albuterol (ACCUNEB) 1.25 MG/3ML nebulizer solution Take 3 mLs (1.25 mg total) by nebulization every 6 (six) hours as needed for wheezing. 75 mL 1  . albuterol (VENTOLIN HFA) 108 (90 Base)  MCG/ACT inhaler Inhale 2 puffs into the lungs every 6 (six) hours as needed for wheezing or shortness of breath. 1 Inhaler 2  . alendronate (FOSAMAX) 70 MG tablet TAKE 1 TABLET BY MOUTH ONCE A WEEK 12 tablet 1  . ALPRAZolam (XANAX) 1 MG tablet TAKE 1 TABLET (1 MG TOTAL) BY MOUTH AT BEDTIME AS NEEDED FOR ANXIETY. 30 tablet 1  . amLODipine (NORVASC) 5 MG tablet TAKE 1 TABLET BY MOUTH EVERY DAY 90 tablet 1  . apixaban (ELIQUIS) 2.5 MG TABS tablet Take 1 tablet (2.5 mg total) by mouth 2 (two) times daily. 60 tablet 3  . docusate sodium (COLACE) 100 MG capsule Take 200 mg by mouth 2 (two) times daily as needed for moderate constipation.     . DULoxetine (CYMBALTA) 20 MG capsule Take 2 capsules (40 mg total) by mouth daily. 60 capsule 3  . esomeprazole (NEXIUM) 40 MG capsule Take 1 capsule (40 mg total) by mouth daily. 90 capsule 1  . fluticasone (FLONASE) 50 MCG/ACT nasal spray Place 2 sprays into both nostrils daily. 16 g 6  . fluticasone (FLOVENT HFA) 220 MCG/ACT inhaler Inhale 1 puff into the lungs 2 (two) times daily. 1 Inhaler 11  . LINZESS 290 MCG CAPS capsule TAKE 1 CAPSULE BY MOUTH EVERY DAY 90 capsule 1  . lovastatin (MEVACOR) 10 MG tablet TAKE 1 TABLET BY MOUTH EVERY DAY 90 tablet  0  . NARCAN 4 MG/0.1ML LIQD nasal spray kit     . ondansetron (ZOFRAN ODT) 4 MG disintegrating tablet Take 1 tablet (4 mg total) by mouth every 8 (eight) hours as needed for nausea or vomiting. 30 tablet 0  . Oxycodone HCl 10 MG TABS Take 10 mg by mouth 3 (three) times daily.    . polyethylene glycol (MIRALAX / GLYCOLAX) packet Take 17 g by mouth daily as needed (for constipation.).     Marland Kitchen pregabalin (LYRICA) 100 MG capsule TAKE 1 CAPSULE BY MOUTH TWICE A DAY 180 capsule 1  . promethazine (PHENERGAN) 12.5 MG tablet Take 1 tablet (12.5 mg total) by mouth every 12 (twelve) hours as needed for nausea or vomiting. 20 tablet 0  . traZODone (DESYREL) 50 MG tablet Take 0.5-1.5 tablets (25-75 mg total) by mouth at bedtime as  needed for sleep. 90 tablet 0   No facility-administered medications prior to visit.     Per HPI unless specifically indicated in ROS section below Review of Systems Objective:    BP 140/86 (BP Location: Left Arm, Patient Position: Sitting, Cuff Size: Normal)   Pulse (!) 102   Temp 98 F (36.7 C) (Temporal)   Ht '5\' 4"'  (1.626 m)   Wt 172 lb 9 oz (78.3 kg)   SpO2 97%   BMI 29.62 kg/m   Wt Readings from Last 3 Encounters:  07/29/19 172 lb 9 oz (78.3 kg)  07/23/19 171 lb 3.2 oz (77.7 kg)  07/15/19 170 lb (77.1 kg)    Physical Exam Vitals and nursing note reviewed.  Constitutional:      Appearance: Normal appearance.     Comments: Sitting in wheelchair  Cardiovascular:     Rate and Rhythm: Normal rate and regular rhythm.     Pulses: Normal pulses.     Heart sounds: Normal heart sounds. No murmur.  Pulmonary:     Effort: Pulmonary effort is normal. No respiratory distress.     Breath sounds: Normal breath sounds. No wheezing, rhonchi or rales.  Abdominal:     General: Abdomen is flat. Bowel sounds are normal. There is no distension.     Palpations: Abdomen is soft. There is no mass.     Tenderness: There is abdominal tenderness (mild) in the suprapubic area. There is no right CVA tenderness, left CVA tenderness, guarding or rebound.     Hernia: No hernia is present.  Musculoskeletal:     Right lower leg: No edema.     Left lower leg: No edema.  Neurological:     Mental Status: She is alert.       Results for orders placed or performed in visit on 07/29/19  POCT Urinalysis Dipstick (Automated)  Result Value Ref Range   Color, UA yellow    Clarity, UA cloudy    Glucose, UA Negative Negative   Bilirubin, UA negative    Ketones, UA negative    Spec Grav, UA 1.020 1.010 - 1.025   Blood, UA +/-    pH, UA 6.0 5.0 - 8.0   Protein, UA Positive (A) Negative   Urobilinogen, UA 1.0 0.2 or 1.0 E.U./dL   Nitrite, UA negative    Leukocytes, UA Large (3+) (A) Negative   Lab  Results  Component Value Date   CREATININE 0.78 07/15/2019   BUN 8 07/15/2019   NA 140 07/15/2019   K 3.6 07/15/2019   CL 106 07/15/2019   CO2 27 07/15/2019    Assessment & Plan:  This visit occurred during the SARS-CoV-2 public health emergency.  Safety protocols were in place, including screening questions prior to the visit, additional usage of staff PPE, and extensive cleaning of exam room while observing appropriate contact time as indicated for disinfecting solutions.   Problem List Items Addressed This Visit    Acute cystitis - Primary    UA and story suspicious for recurrent UTI.  Given several antibiotic allergies, will treat with cipro 245m 7d course. rec push fluids and rest.  Consider uro eval for recurrent UTIs (4th in the past year).        Other Visit Diagnoses    Dysuria       Relevant Orders   POCT Urinalysis Dipstick (Automated) (Completed)   Urine Culture       Meds ordered this encounter  Medications  . ciprofloxacin (CIPRO) 250 MG tablet    Sig: Take 1 tablet (250 mg total) by mouth 2 (two) times daily.    Dispense:  14 tablet    Refill:  0   Orders Placed This Encounter  Procedures  . Urine Culture  . POCT Urinalysis Dipstick (Automated)    Follow up plan: Return if symptoms worsen or fail to improve.  JRia Bush MD

## 2019-07-29 NOTE — Patient Instructions (Addendum)
Concern for UTI - treat with cipro antibiotic for 7 days.  Let ortho know we're treating this UTI with 7 days of antibiotic.  Let us know if not improving with treatment. We will be in touch with culture results.   Urinary Tract Infection, Adult A urinary tract infection (UTI) is an infection of any part of the urinary tract. The urinary tract includes:  The kidneys.  The ureters.  The bladder.  The urethra. These organs make, store, and get rid of pee (urine) in the body. What are the causes? This is caused by germs (bacteria) in your genital area. These germs grow and cause swelling (inflammation) of your urinary tract. What increases the risk? You are more likely to develop this condition if:  You have a small, thin tube (catheter) to drain pee.  You cannot control when you pee or poop (incontinence).  You are female, and: ? You use these methods to prevent pregnancy:  A medicine that kills sperm (spermicide).  A device that blocks sperm (diaphragm). ? You have low levels of a female hormone (estrogen). ? You are pregnant.  You have genes that add to your risk.  You are sexually active.  You take antibiotic medicines.  You have trouble peeing because of: ? A prostate that is bigger than normal, if you are female. ? A blockage in the part of your body that drains pee from the bladder (urethra). ? A kidney stone. ? A nerve condition that affects your bladder (neurogenic bladder). ? Not getting enough to drink. ? Not peeing often enough.  You have other conditions, such as: ? Diabetes. ? A weak disease-fighting system (immune system). ? Sickle cell disease. ? Gout. ? Injury of the spine. What are the signs or symptoms? Symptoms of this condition include:  Needing to pee right away (urgently).  Peeing often.  Peeing small amounts often.  Pain or burning when peeing.  Blood in the pee.  Pee that smells bad or not like normal.  Trouble peeing.  Pee  that is cloudy.  Fluid coming from the vagina, if you are female.  Pain in the belly or lower back. Other symptoms include:  Throwing up (vomiting).  No urge to eat.  Feeling mixed up (confused).  Being tired and grouchy (irritable).  A fever.  Watery poop (diarrhea). How is this treated? This condition may be treated with:  Antibiotic medicine.  Other medicines.  Drinking enough water. Follow these instructions at home:  Medicines  Take over-the-counter and prescription medicines only as told by your doctor.  If you were prescribed an antibiotic medicine, take it as told by your doctor. Do not stop taking it even if you start to feel better. General instructions  Make sure you: ? Pee until your bladder is empty. ? Do not hold pee for a long time. ? Empty your bladder after sex. ? Wipe from front to back after pooping if you are a female. Use each tissue one time when you wipe.  Drink enough fluid to keep your pee pale yellow.  Keep all follow-up visits as told by your doctor. This is important. Contact a doctor if:  You do not get better after 1-2 days.  Your symptoms go away and then come back. Get help right away if:  You have very bad back pain.  You have very bad pain in your lower belly.  You have a fever.  You are sick to your stomach (nauseous).  You are throwing up.  Summary  A urinary tract infection (UTI) is an infection of any part of the urinary tract.  This condition is caused by germs in your genital area.  There are many risk factors for a UTI. These include having a small, thin tube to drain pee and not being able to control when you pee or poop.  Treatment includes antibiotic medicines for germs.  Drink enough fluid to keep your pee pale yellow. This information is not intended to replace advice given to you by your health care provider. Make sure you discuss any questions you have with your health care provider. Document  Revised: 04/30/2018 Document Reviewed: 11/20/2017 Elsevier Patient Education  2020 [employer].

## 2019-07-30 ENCOUNTER — Encounter: Payer: Self-pay | Admitting: Cardiology

## 2019-07-30 ENCOUNTER — Telehealth: Payer: Self-pay | Admitting: Cardiology

## 2019-07-30 ENCOUNTER — Telehealth (INDEPENDENT_AMBULATORY_CARE_PROVIDER_SITE_OTHER): Payer: Medicare Other | Admitting: Cardiology

## 2019-07-30 VITALS — HR 86 | Ht 64.0 in | Wt 172.0 lb

## 2019-07-30 DIAGNOSIS — Z01810 Encounter for preprocedural cardiovascular examination: Secondary | ICD-10-CM

## 2019-07-30 DIAGNOSIS — I10 Essential (primary) hypertension: Secondary | ICD-10-CM | POA: Diagnosis not present

## 2019-07-30 DIAGNOSIS — M25551 Pain in right hip: Secondary | ICD-10-CM | POA: Diagnosis not present

## 2019-07-30 DIAGNOSIS — I340 Nonrheumatic mitral (valve) insufficiency: Secondary | ICD-10-CM

## 2019-07-30 NOTE — Progress Notes (Signed)
Virtual Visit via Telephone Note   This visit type was conducted due to national recommendations for restrictions regarding the COVID-19 Pandemic (e.g. social distancing) in an effort to limit this patient's exposure and mitigate transmission in our community.  Due to her co-morbid illnesses, this patient is at least at moderate risk for complications without adequate follow up.  This format is felt to be most appropriate for this patient at this time.  The patient did not have access to video technology/had technical difficulties with video requiring transitioning to audio format only (telephone).  All issues noted in this document were discussed and addressed.  No physical exam could be performed with this format.  Please refer to the patient's chart for her  consent to telehealth for [facility].   Date:  07/30/2019   ID:  [name], DOB 08/04/1940, MRN [id number]  Patient Location: Home Provider Location: Office  PCP:  [name], FNP  Cardiologist:  Kate Sable, MD  Electrophysiologist:  None   Evaluation Performed:  Follow-Up Visit  Chief Complaint: Follow-up visit after stress test  History of Present Illness:    [name] is a 79 y.o. female with a hx of hypertension, hyperlipidemia, COPD, PE on Eliquis, GERD, former smoker, TIA who is being evaluated for a follow-up visit.  She was last seen for preop eval prior to right hip surgery. Patient has a history of right hip pain and is scheduled to undergo surgery in 3 days.  Due to risk factors, cardiac eval requested.  She states having chest discomfort  when her blood pressures were elevated.  Also states having epigastric pain lasting a few seconds, not related with exertion.  She attributes the symptoms of chest pain due to anxiety.  She does not do a lot of walking due to right hip pain.  Her daughter helps her with ambulation.  Due to risk factors, Lexiscan stress test was ordered.  Last echocardiogram  on 04/2018 showed normal systolic function with EF 60 to 65%, mild MR, moderately calcified aortic annulus no stenosis.  The patient does not have symptoms concerning for COVID-19 infection (fever, chills, cough, or new shortness of breath).    Past Medical History:  Diagnosis Date  . Acute postoperative pain 02/03/2018  . Anemia   . B12 deficiency   . Bacteremia 10/07/2014  . Bladder incontinence   . Blind left eye   . Cancer ([place])    skin cancer   . Cataract   . Cervical spine fracture ([place])   . Chest pain 07/29/2013  . Compression fracture    C7,  upper T spine -compression fx  . COPD (chronic obstructive pulmonary disease) ([place])   . DDD (degenerative disc disease), lumbar   . Depression    major  . Diffuse myofascial pain syndrome 03/24/2015  . Dyspnea    at times- when activty  . Fever 10/05/2014  . GERD (gastroesophageal reflux disease)   . Hiatal hernia   . Hyperlipidemia   . Hypertension   . 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 Macular degeneration   . On home oxygen therapy    "3L; all the time" (04/30/2018)  . Osteoarthritis   . Osteoporosis   . Pneumonia    years ago  . Pulmonary embolism ([place]) 04/30/2018  . Reactive airway disease   . Rupture of bowel ([place])   . Sepsis ([place]) 10/08/2014  . Stroke [facility])    "mini stroke"- balance and memory issue  .  Stroke ([place])   . Wrist pain, acute 09/10/2012   Past Surgical History:  Procedure Laterality Date  . ABDOMINAL HYSTERECTOMY    . ABDOMINAL SURGERY    . APPENDECTOMY    . BLADDER SURGERY    . CATARACT EXTRACTION W/ INTRAOCULAR LENS  IMPLANT, BILATERAL Bilateral   . CHOLECYSTECTOMY    . COLON SURGERY    . COLOSTOMY REVERSAL    . ESOPHAGOGASTRODUODENOSCOPY N/A 10/30/2018   Procedure: ESOPHAGOGASTRODUODENOSCOPY (EGD);  Surgeon: Lin Landsman, MD;  Location: [facility] ENDOSCOPY;  Service: Gastroenterology;  Laterality: N/A;  . INTRAMEDULLARY (IM) NAIL INTERTROCHANTERIC Right 09/13/2016   Procedure:  INTRAMEDULLARY (IM) NAIL INTERTROCHANTRIC RIGHT;  Surgeon: Leandrew Koyanagi, MD;  Location: [place];  Service: Orthopedics;  Laterality: Right;  . JOINT REPLACEMENT Bilateral   . KNEE SURGERY    . OSTOMY    . RECTOCELE REPAIR    . TOE SURGERY Right    3rd  . TOTAL HIP ARTHROPLASTY Right 09/30/2017   Procedure: RIGHT HIP IM NAIL REMOVAL WITH CONVERSION TO TOTAL HIP ARTHOPLASTY, anterior approach;  Surgeon: Renette Butters, MD;  Location: [place];  Service: Orthopedics;  Laterality: Right;     Current Meds  Medication Sig  . acetaminophen (TYLENOL) 500 MG tablet Take 2 tablets (1,000 mg total) by mouth 3 (three) times daily. (Patient taking differently: Take 1,000 mg by mouth 2 (two) times daily. )  . albuterol (ACCUNEB) 1.25 MG/3ML nebulizer solution Take 3 mLs (1.25 mg total) by nebulization every 6 (six) hours as needed for wheezing.  Marland Kitchen albuterol (VENTOLIN HFA) 108 (90 Base) MCG/ACT inhaler Inhale 2 puffs into the lungs every 6 (six) hours as needed for wheezing or shortness of breath.  Marland Kitchen alendronate (FOSAMAX) 70 MG tablet TAKE 1 TABLET BY MOUTH ONCE A WEEK  . ALPRAZolam (XANAX) 1 MG tablet TAKE 1 TABLET (1 MG TOTAL) BY MOUTH AT BEDTIME AS NEEDED FOR ANXIETY.  Marland Kitchen amLODipine (NORVASC) 5 MG tablet TAKE 1 TABLET BY MOUTH EVERY DAY  . ciprofloxacin (CIPRO) 250 MG tablet Take 1 tablet (250 mg total) by mouth 2 (two) times daily.  Marland Kitchen docusate sodium (COLACE) 100 MG capsule Take 200 mg by mouth 2 (two) times daily as needed for moderate constipation.   . DULoxetine (CYMBALTA) 20 MG capsule Take 2 capsules (40 mg total) by mouth daily.  Marland Kitchen esomeprazole (NEXIUM) 40 MG capsule Take 1 capsule (40 mg total) by mouth daily.  . fluticasone (FLONASE) 50 MCG/ACT nasal spray Place 2 sprays into both nostrils daily.  . fluticasone (FLOVENT HFA) 220 MCG/ACT inhaler Inhale 1 puff into the lungs 2 (two) times daily.  Marland Kitchen LINZESS 290 MCG CAPS capsule TAKE 1 CAPSULE BY MOUTH EVERY DAY  . lovastatin (MEVACOR) 10 MG tablet  TAKE 1 TABLET BY MOUTH EVERY DAY  . NARCAN 4 MG/0.1ML LIQD nasal spray kit   . ondansetron (ZOFRAN ODT) 4 MG disintegrating tablet Take 1 tablet (4 mg total) by mouth every 8 (eight) hours as needed for nausea or vomiting.  . Oxycodone HCl 10 MG TABS Take 10 mg by mouth 3 (three) times daily.  . polyethylene glycol (MIRALAX / GLYCOLAX) packet Take 17 g by mouth daily as needed (for constipation.).   Marland Kitchen pregabalin (LYRICA) 100 MG capsule TAKE 1 CAPSULE BY MOUTH TWICE A DAY  . promethazine (PHENERGAN) 12.5 MG tablet Take 1 tablet (12.5 mg total) by mouth every 12 (twelve) hours as needed for nausea or vomiting.  . traZODone (DESYREL) 50 MG tablet Take 0.5-1.5  tablets (25-75 mg total) by mouth at bedtime as needed for sleep.     Allergies:   Ampicillin, Ampicillin-sulbactam sodium, Ambien [zolpidem tartrate], Cyclobenzaprine, Tape, Toradol [ketorolac tromethamine], Sulfamethoxazole-trimethoprim, Zolpidem, Cephalexin, and Tramadol   Social History   Tobacco Use  . Smoking status: Former Smoker    Packs/day: 1.50    Years: 30.00    Pack years: 45.00    Types: E-cigarettes, Cigarettes    Quit date: 2013    Years since quitting: 8.1  . Smokeless tobacco: Never Used  Substance Use Topics  . Alcohol use: No    Alcohol/week: 0.0 standard drinks  . Drug use: No     Family Hx: The patient's family history includes Cervical cancer in her daughter; Deep vein thrombosis in her sister and son; Heart attack in her father; Heart failure in her mother; Lung cancer in her sister; Pulmonary embolism in her son.  ROS:   Please see the history of present illness.     All other systems reviewed and are negative.   Prior CV studies:   The following studies were reviewed today:  Lexiscan myocardial perfusion stress test.  07/22/2019 Pharmacological myocardial perfusion imaging study with no significant  ischemia Normal wall motion, EF estimated at 38% (depressed ejection fraction likely secondary to  GI uptake artifact No EKG changes concerning for ischemia at peak stress or in recovery. CT attenuation correction images with mild aortic atherosclerosis, mild coronary calcification Low risk scan Labs/Other Tests and Data Reviewed:    EKG:  No ECG reviewed.  Recent Labs: 07/15/2019: ALT 20; B Natriuretic Peptide 52.0; BUN 8; Creatinine, Ser 0.78; Hemoglobin 12.5; Platelets 215; Potassium 3.6; Sodium 140   Recent Lipid Panel No results found for: CHOL, TRIG, HDL, CHOLHDL, LDLCALC, LDLDIRECT  Wt Readings from Last 3 Encounters:  07/30/19 172 lb (78 kg)  07/29/19 172 lb 9 oz (78.3 kg)  07/23/19 171 lb 3.2 oz (77.7 kg)     Objective:    Vital Signs:  Pulse 86   Ht _0  (1.626 m)   Wt 172 lb (78 kg)   SpO2 93%   BMI 29.52 kg/m    VITAL SIGNS:  reviewed  ASSESSMENT & PLAN:    1. Patient with right hip pain, scheduled to have right hip surgery.  She has risk factors of hypertension, hyperlipidemia, former smoker.  Had an episode of atypical chest pain associated with elevated blood pressure.  Lexiscan myocardial perfusion stress test did not show any evidence for ischemia.  Low risk scan. Patient can therefore proceed with surgery as planned.  Last echocardiogram showed normal ejection fraction. 2. Patient with history of mitral insufficiency on last echocardiogram in 2019.  Plan for repeat echocardiogram in 6 months to monitor MR and also aortic calcifications or presence of stenosis.  3. History of hypertension, last blood pressure was reasonably controlled.  Continue current blood pressure meds.  COVID-19 Education: The signs and symptoms of COVID-19 were discussed with the patient and how to seek care for testing (follow up with PCP or arrange E-visit).  The importance of social distancing was discussed today.  Patient had a recent Covid test prior to her planned surgery which was negative.  Time:   Today, I have spent 35 minutes with the patient with telehealth technology  discussing the above problems.     Medication Adjustments/Labs and Tests Ordered: Current medicines are reviewed at length with the patient today.  Concerns regarding medicines are outlined above.   Tests Ordered: Orders  Placed This Encounter  Procedures  . ECHOCARDIOGRAM COMPLETE    Medication Changes: No orders of the defined types were placed in this encounter.   Follow Up:  In Person in 6 month(s)  Signed, Kate Sable, MD  07/30/2019 2:35 PM    [facility]

## 2019-07-30 NOTE — Telephone Encounter (Signed)

## 2019-07-30 NOTE — Patient Instructions (Signed)
Medication Instructions:  Your physician recommends that you continue on your current medications as directed. Please refer to the Current Medication list given to you today.  *If you need a refill on your cardiac medications before your next appointment, please call your pharmacy*   Lab Work: None If you have labs (blood work) drawn today and your tests are completely normal, you will receive your results only by: Marland Kitchen MyChart Message (if you have MyChart) OR . A paper copy in the mail If you have any lab test that is abnormal or we need to change your treatment, we will call you to review the results.   Testing/Procedures: Your physician has requested that you have an echocardiogram. Echocardiography is a painless test that uses sound waves to create images of your heart. It provides your doctor with information about the size and shape of your heart and how well your heart's chambers and valves are working. This procedure takes approximately one hour. There are no restrictions for this procedure.     Follow-Up: At [facility], you and your health needs are our priority.  As part of our continuing mission to provide you with exceptional heart care, we have created designated Provider Care Teams.  These Care Teams include your primary Cardiologist (physician) and Advanced Practice Providers (APPs -  Physician Assistants and Nurse Practitioners) who all work together to provide you with the care you need, when you need it.  We recommend signing up for the patient portal called "MyChart".  Sign up information is provided on this After Visit Summary.  MyChart is used to connect with patients for Virtual Visits (Telemedicine).  Patients are able to view lab/test results, encounter notes, upcoming appointments, etc.  Non-urgent messages can be sent to your provider as well.   To learn more about what you can do with MyChart, go to [email or web address].    Your next appointment:   6 month(s)  after echo.  The format for your next appointment:   In Person  Provider:    You may see Kate Sable, MD or one of the following Advanced Practice Providers on your designated Care Team:    Murray Hodgkins, NP  Christell Faith, PA-C  Marrianne Mood, PA-C    Other Instructions  Echocardiogram An echocardiogram is a procedure that uses painless sound waves (ultrasound) to produce an image of the heart. Images from an echocardiogram can provide important information about:  Signs of coronary artery disease (CAD).  Aneurysm detection. An aneurysm is a weak or damaged part of an artery wall that bulges out from the normal force of blood pumping through the body.  Heart size and shape. Changes in the size or shape of the heart can be associated with certain conditions, including heart failure, aneurysm, and CAD.  Heart muscle function.  Heart valve function.  Signs of a past heart attack.  Fluid buildup around the heart.  Thickening of the heart muscle.  A tumor or infectious growth around the heart valves. Tell a health care provider about:  Any allergies you have.  All medicines you are taking, including vitamins, herbs, eye drops, creams, and over-the-counter medicines.  Any blood disorders you have.  Any surgeries you have had.  Any medical conditions you have.  Whether you are pregnant or may be pregnant. What are the risks? Generally, this is a safe procedure. However, problems may occur, including:  Allergic reaction to dye (contrast) that may be used during the procedure. What happens before the procedure?  No specific preparation is needed. You may eat and drink normally. What happens during the procedure?   An IV tube may be inserted into one of your veins.  You may receive contrast through this tube. A contrast is an injection that improves the quality of the pictures from your heart.  A gel will be applied to your chest.  A wand-like tool  (transducer) will be moved over your chest. The gel will help to transmit the sound waves from the transducer.  The sound waves will harmlessly bounce off of your heart to allow the heart images to be captured in real-time motion. The images will be recorded on a computer. The procedure may vary among health care providers and hospitals. What happens after the procedure?  You may return to your normal, everyday life, including diet, activities, and medicines, unless your health care provider tells you not to do that. Summary  An echocardiogram is a procedure that uses painless sound waves (ultrasound) to produce an image of the heart.  Images from an echocardiogram can provide important information about the size and shape of your heart, heart muscle function, heart valve function, and fluid buildup around your heart.  You do not need to do anything to prepare before this procedure. You may eat and drink normally.  After the echocardiogram is completed, you may return to your normal, everyday life, unless your health care provider tells you not to do that. This information is not intended to replace advice given to you by your health care provider. Make sure you discuss any questions you have with your health care provider. Document Revised: 09/03/2018 Document Reviewed: 06/15/2016 Elsevier Patient Education  [place].

## 2019-07-31 LAB — URINE CULTURE
MICRO NUMBER:: 10214897
SPECIMEN QUALITY:: ADEQUATE

## 2019-07-31 IMAGING — CT CT ABD-PELV W/ CM
2 of 5 series · 16 of 46 positions shown, 18 images · IV contrast (APPLIED)
Comparison: Abdominal CT 08/24/2017

CLINICAL DATA: Abdominal pain and distension.  Vomiting.

EXAM:
CT ABDOMEN AND PELVIS WITH CONTRAST
TECHNIQUE: Multidetector CT imaging of the abdomen and pelvis was performed
using the standard protocol following bolus administration of
intravenous contrast.
CONTRAST:  100mL OMNIPAQUE IOHEXOL 300 MG/ML  SOLN

[Series 3: abd/ pelvis 5.0 i30f 2 · axial · 0.96mm/px · z∈[+928,+1333]mm · 13 of 91 slices shown, 15 images]
[im 5/91  soft-tissue]
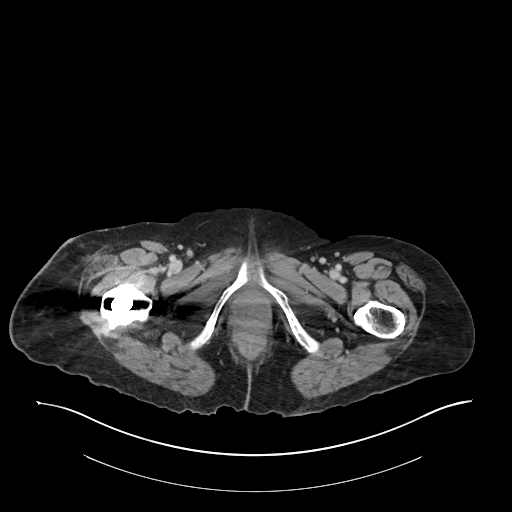
[im 5/91  bone]
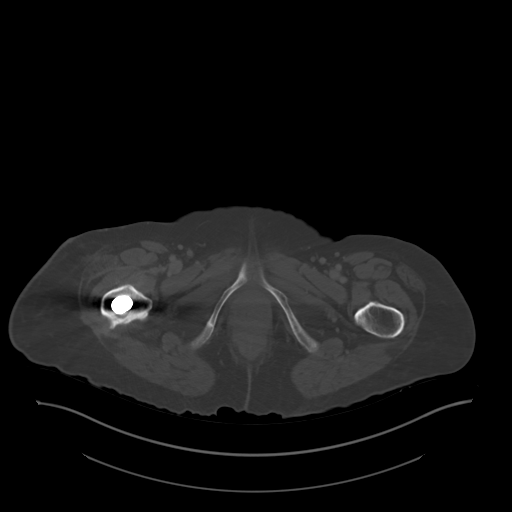
[im 13/91  soft-tissue]
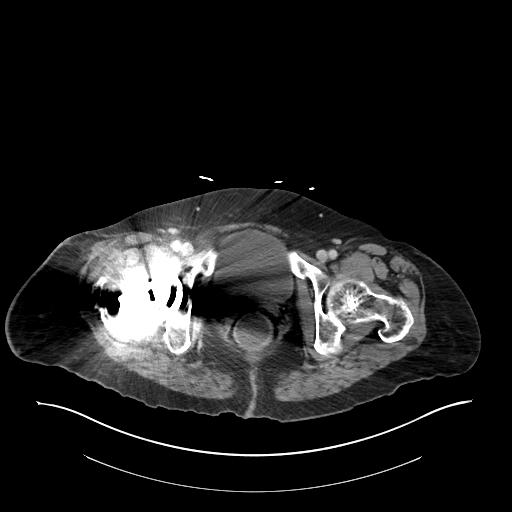
[im 18/91  soft-tissue]
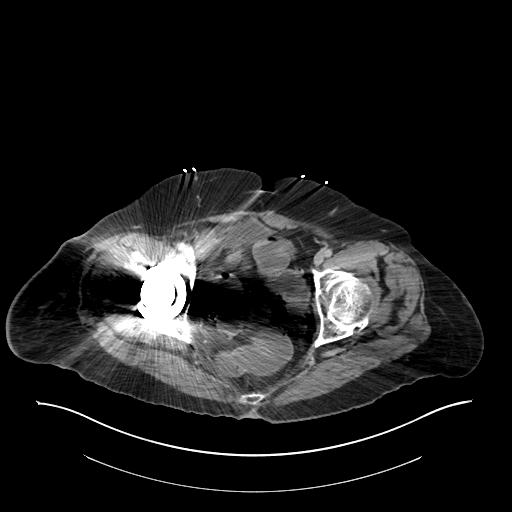
[im 26/91  soft-tissue]
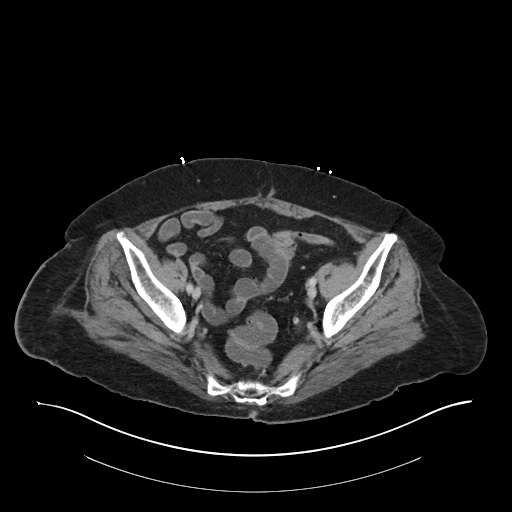
[im 31/91  soft-tissue]
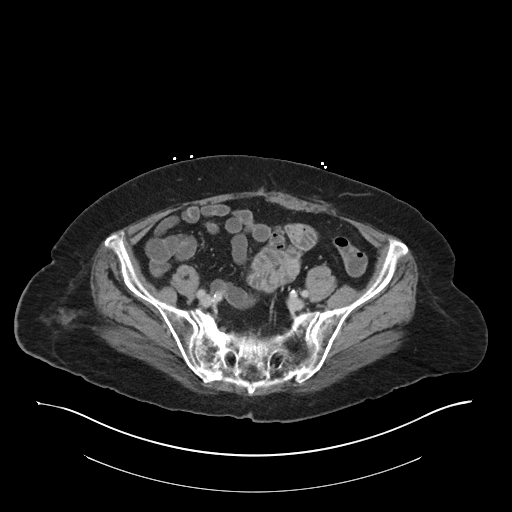
[im 39/91  soft-tissue]
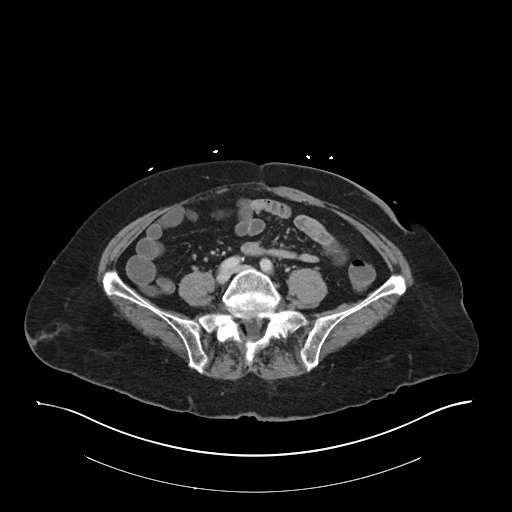
[im 48/91  soft-tissue]
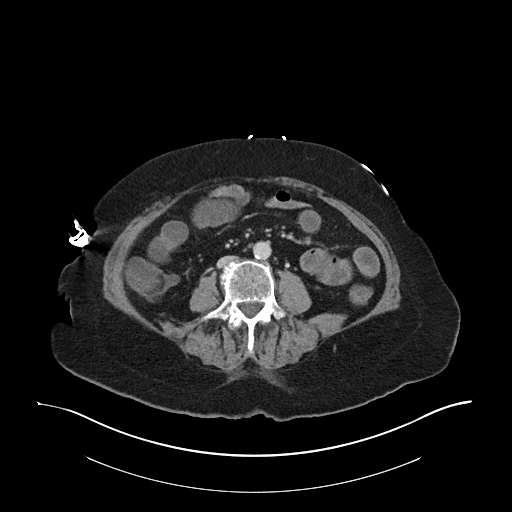
[im 52/91  soft-tissue]
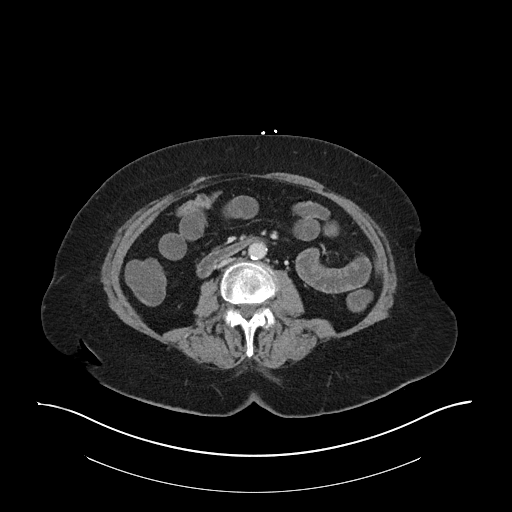
[im 61/91  soft-tissue]
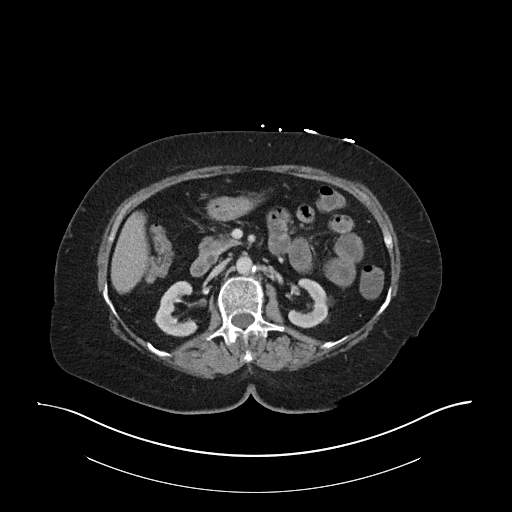
[im 61/91  bone]
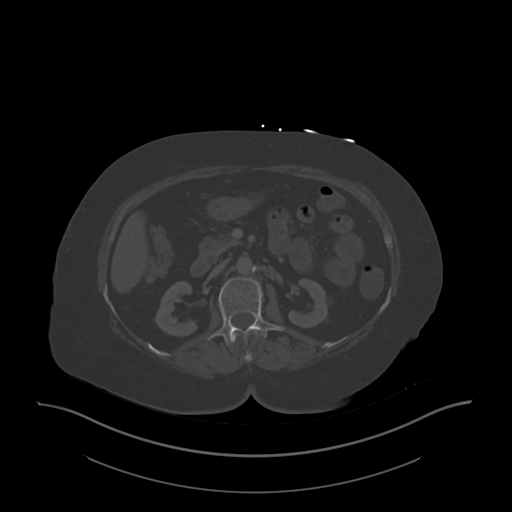
[im 65/91  soft-tissue]
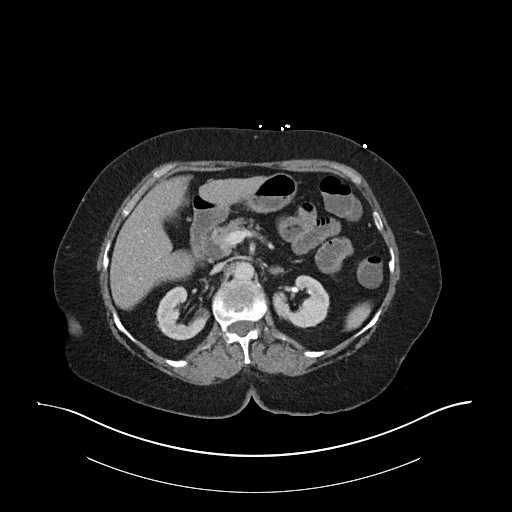
[im 73/91  soft-tissue]
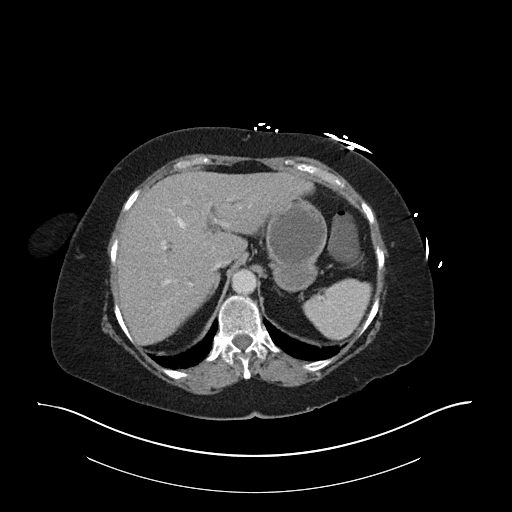
[im 78/91  soft-tissue]
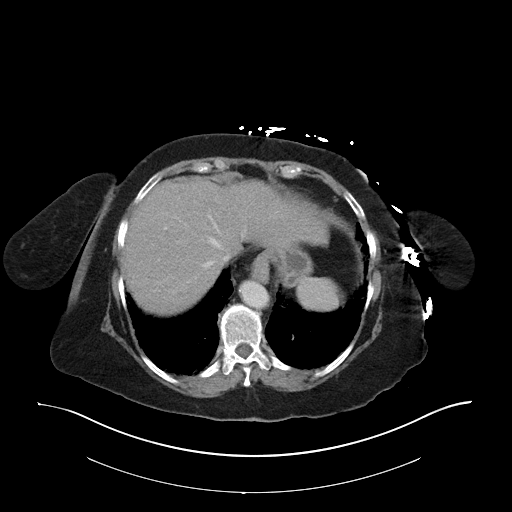
[im 86/91  soft-tissue]
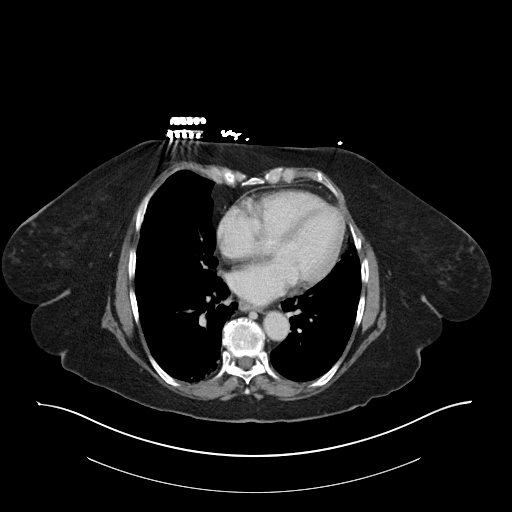

[Series 6: coronal soft tissue · coronal · 0.88mm/px · 3 of 107 slices shown]
[im 36/107  soft-tissue]
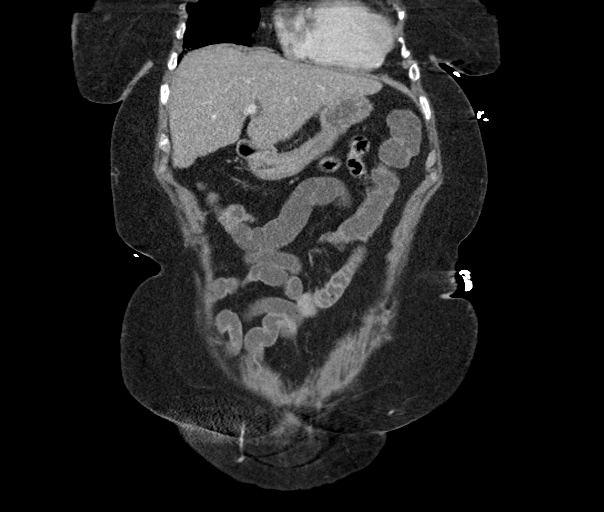
[im 48/107  soft-tissue]
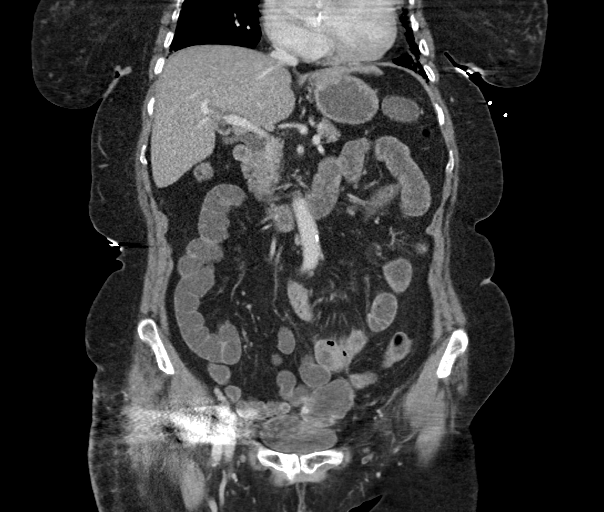
[im 59/107  soft-tissue]
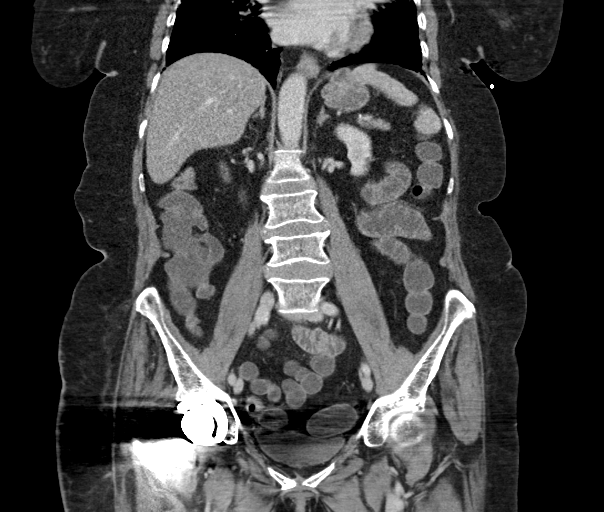

[16 of 46 positions shown; findings below may reference images not displayed]

FINDINGS: Lower chest: Breathing motion artifact. Mild lung base scarring. No
pleural fluid or consolidation.

Hepatobiliary: Decreased hepatic density consistent with steatosis.
Postcholecystectomy with stable intra and extrahepatic biliary
ductal dilatation.

Pancreas: Parenchymal atrophy. No ductal dilatation or inflammation.

Spleen: Calcified granuloma.  Normal in size.

Adrenals/Urinary Tract: Subcentimeter left adrenal nodularity. Right
adrenal gland is normal. No hydronephrosis or perinephric edema.
Homogeneous renal enhancement with symmetric excretion on delayed
phase imaging. Bilateral low-density renal lesions, majority of
which are too small to accurately characterize due to size. Urinary
bladder is physiologically distended without wall thickening.

Stomach/Bowel: Small hiatal hernia. No abnormal gastric distension.
Majority of small bowel is fluid-filled and prominent caliber
without transition point. Fluid-filled ascending, distal transverse,
descending and rectosigmoid colon which is also prominent caliber.
No bowel wall thickening or inflammatory change. Diverticulosis of
the distal colon without diverticulitis. Appendix not visualized,
surgically absent per history.

Vascular/Lymphatic: Calcified bilateral renal artery aneurysms, 7 mm
on the left and 6 mm on the right, unchanged allowing for
differences in caliper placement. Aortic atherosclerosis without
aortic aneurysm. Small retroperitoneal lymph nodes not enlarged by
size criteria.

Reproductive: Status post hysterectomy. No adnexal masses.

Other: No free air or ascites.  No intra-abdominal abscess.

Musculoskeletal: Right hip arthroplasty with expected postsurgical
changes in the periarticular soft tissues. No evidence of
periarticular abscess or fluid collection. Remote right sacral and
pubic bone fracture.
IMPRESSION: 1. Fluid-filled large and small bowel, suggestive of generalized
ileus or enteritis. No transition point to suggest obstruction or
significant bowel inflammation.
2. No other acute abnormality in the abdomen or pelvis.
3. Small calcified bilateral renal artery aneurysms, stable from
prior.
4.  Aortic Atherosclerosis (4131Y-[id number].G).

## 2019-08-01 IMAGING — DX DG HIP (WITH OR WITHOUT PELVIS) 2-3V*R*
3 series · 3 of 3 positions shown · non-contrast
Comparison: Abdominopelvic CT [DATE] hours prior. Pelvic radiograph
09/30/2017

CLINICAL DATA: History of right hip replacement.

EXAM:
DG HIP (WITH OR WITHOUT PELVIS) 2-3V RIGHT

[pelvis ap]
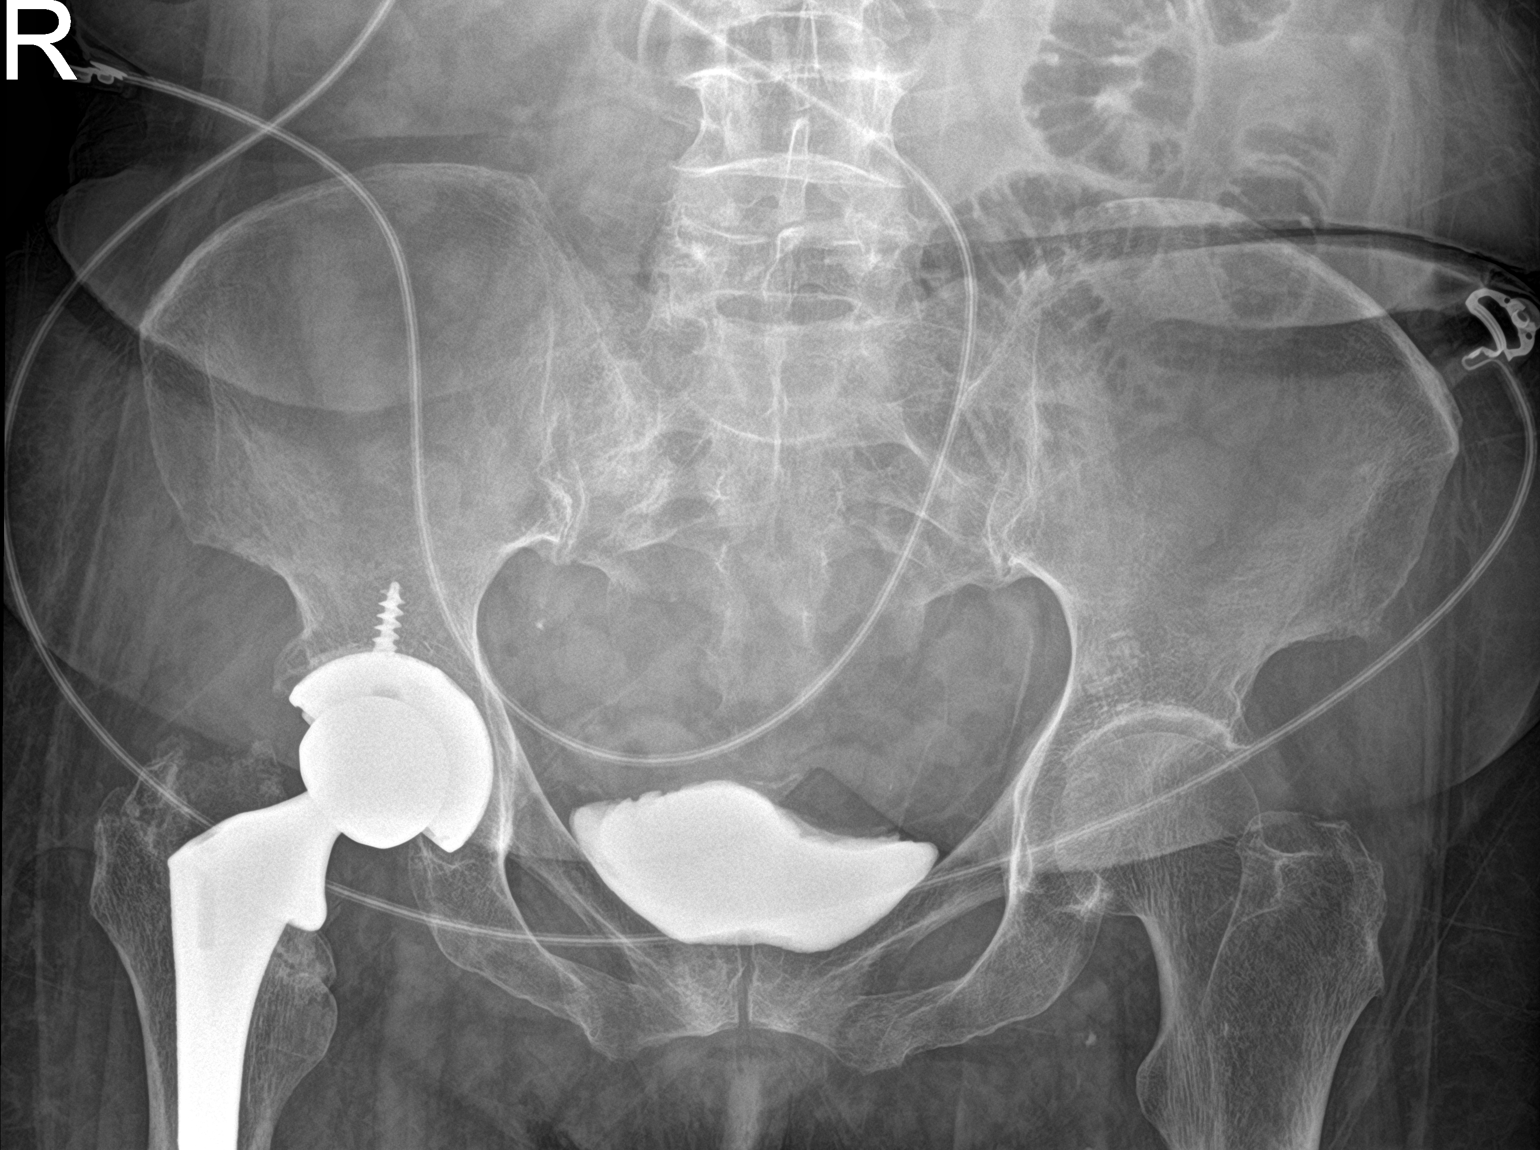

[hip ap]
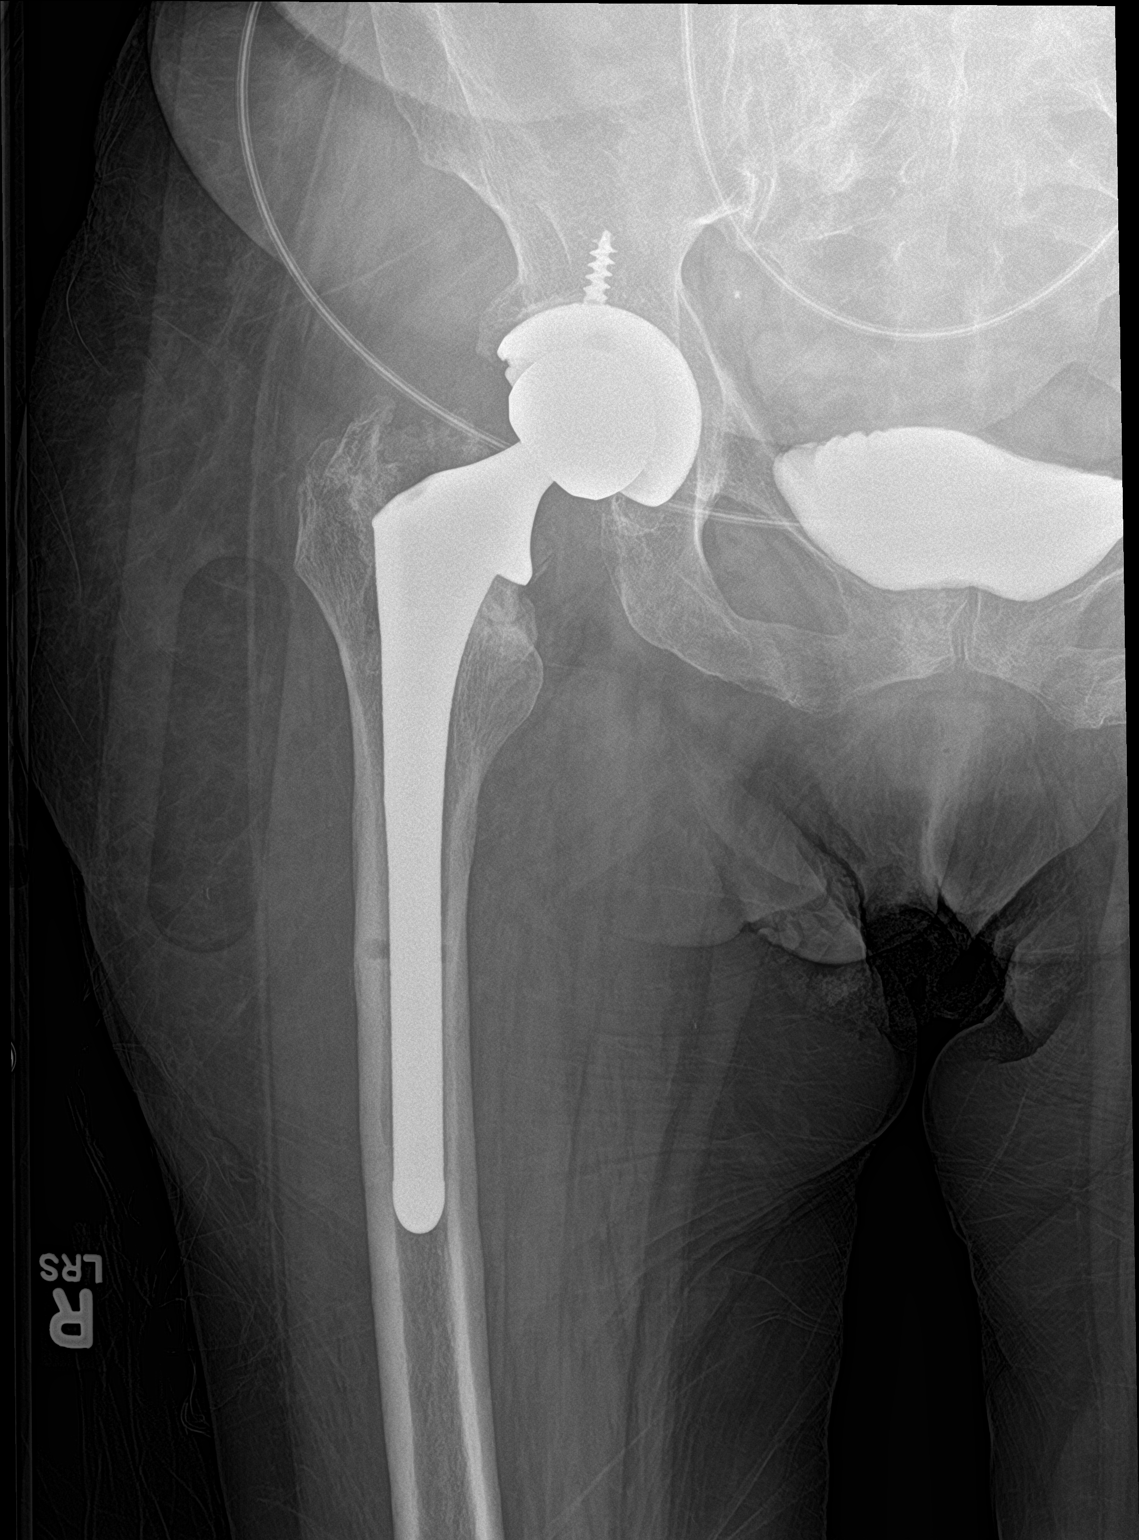

[hip lat]
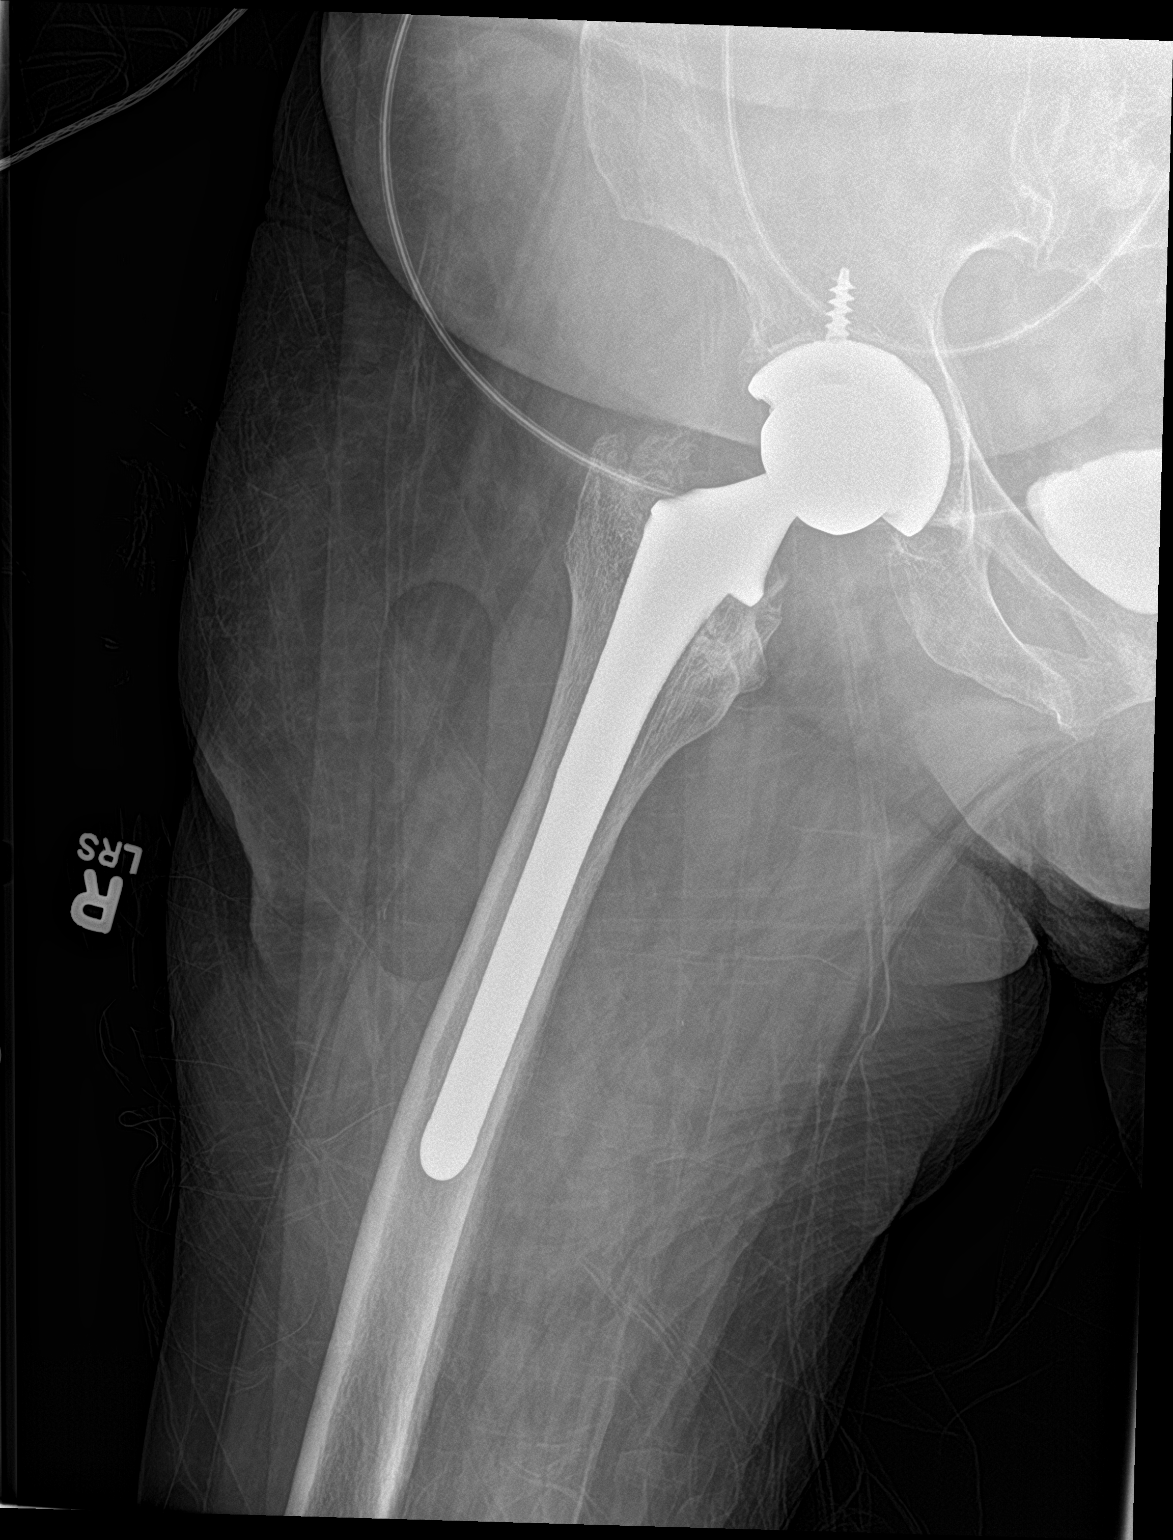

[3 of 3 positions shown; findings below may reference images not displayed]

FINDINGS: Right hip arthroplasty in expected alignment. No periprosthetic
lucency or fracture. Ghost tracks in the proximal femur from prior
right hip hardware. Sclerosis in the right sacrum and right pubic
bones on CT not as well demonstrated radiographically. Excreted IV
contrast in the urinary bladder.
IMPRESSION: Right hip arthroplasty without evident complication.

## 2019-08-02 IMAGING — CR DG ABD PORTABLE 1V
1 series · 1 of 1 positions shown · non-contrast
Comparison: CT abdomen and pelvis October 26, 2017

CLINICAL DATA: Abdominal pain and distension

EXAM:
PORTABLE ABDOMEN - 1 VIEW

[AP]
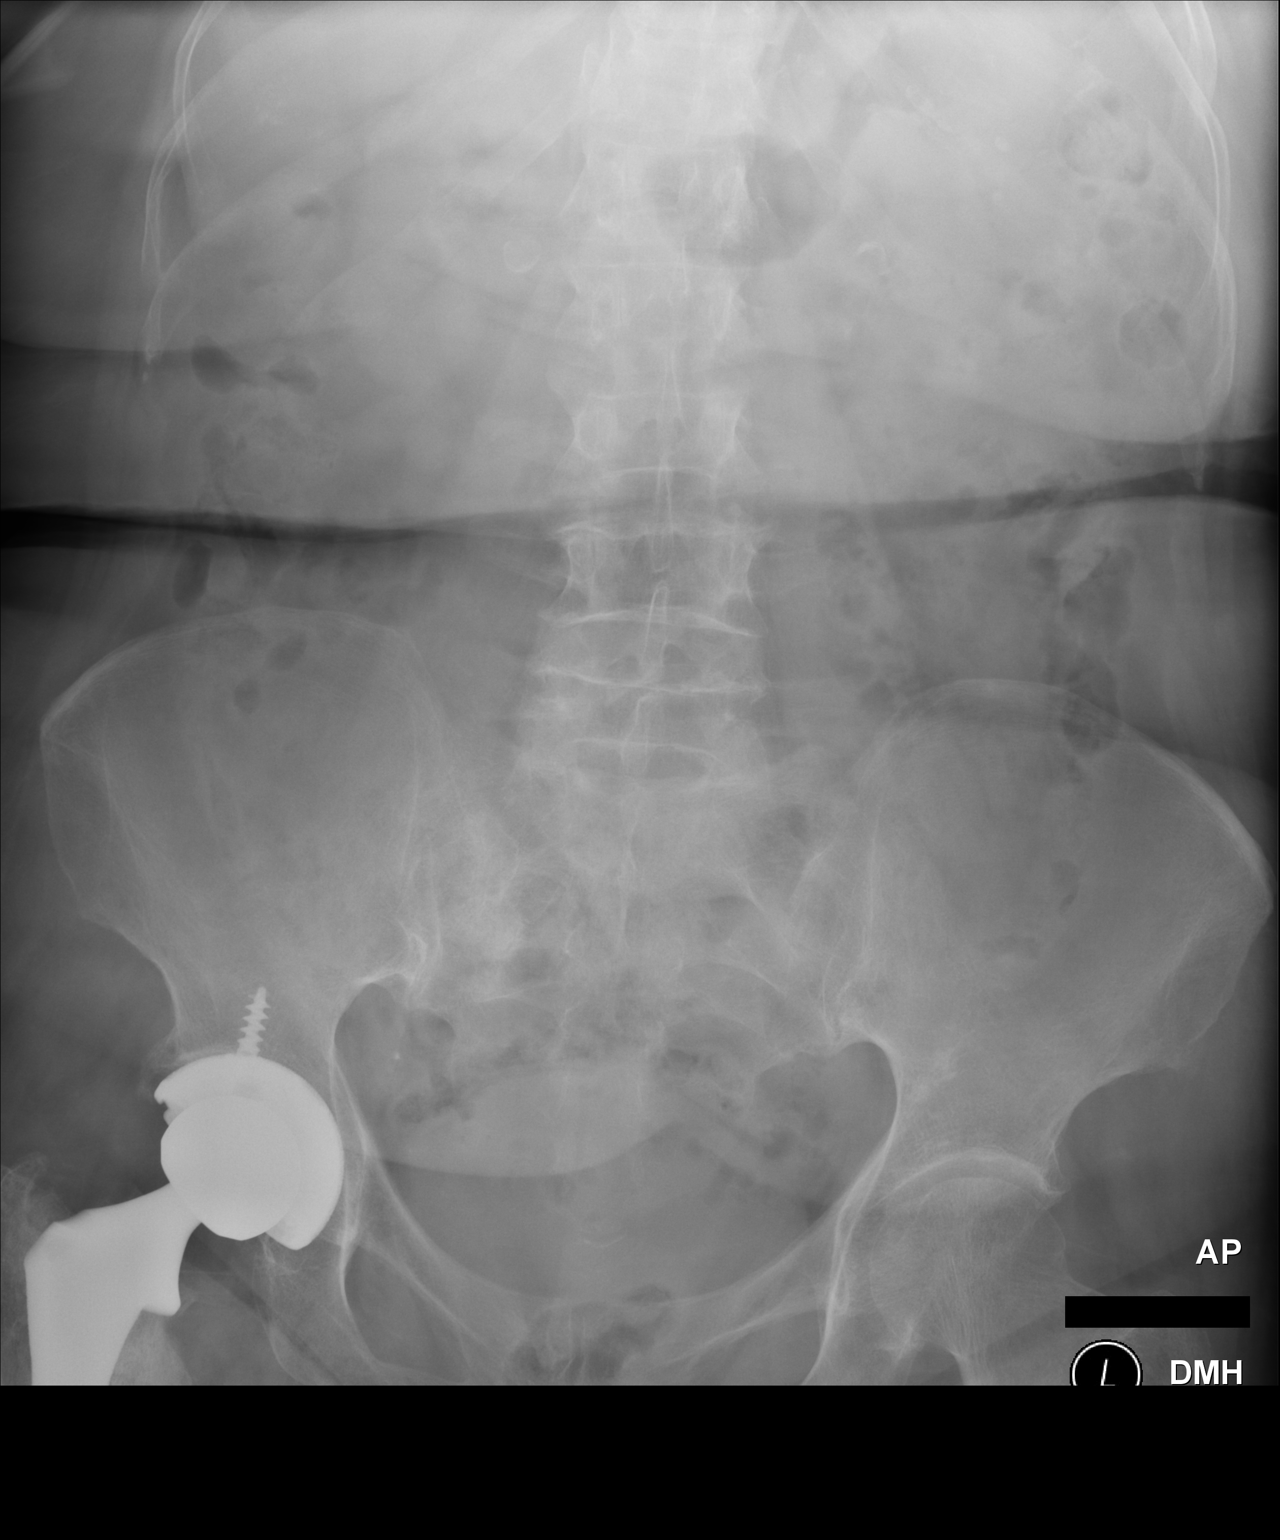

[1 of 1 positions shown; findings below may reference images not displayed]

FINDINGS: Small calcified renal artery aneurysms are noted, also documented on
recent CT. There are calcified splenic granulomas. There is moderate
stool in the colon. There is no bowel dilatation or air-fluid level
to suggest bowel obstruction. No free air. There is a total hip
replacement on the right.
IMPRESSION: No bowel obstruction or free air. Moderate stool in colon. Calcified
splenic granulomas noted as well as small bilateral renal artery
aneurysms, better delineated on recent CT.

## 2019-08-09 ENCOUNTER — Other Ambulatory Visit: Payer: Self-pay | Admitting: Family Medicine

## 2019-08-09 ENCOUNTER — Encounter: Payer: Self-pay | Admitting: Family Medicine

## 2019-08-09 DIAGNOSIS — R339 Retention of urine, unspecified: Secondary | ICD-10-CM

## 2019-08-09 DIAGNOSIS — Z978 Presence of other specified devices: Secondary | ICD-10-CM

## 2019-08-10 ENCOUNTER — Ambulatory Visit: Payer: Medicare Other

## 2019-08-12 ENCOUNTER — Telehealth: Payer: Self-pay | Admitting: *Deleted

## 2019-08-12 NOTE — Telephone Encounter (Signed)
Patient  has not been seen in office yet. She states she is itching on the outside of her vagina. She has a cathter in. I only ask if she has kept the area clean?  I told her the only thing I can tell her is to take a shower and clean the area because she has not be since here yet .

## 2019-08-13 ENCOUNTER — Other Ambulatory Visit: Payer: Self-pay

## 2019-08-13 ENCOUNTER — Encounter: Payer: Self-pay | Admitting: Physician Assistant

## 2019-08-13 ENCOUNTER — Other Ambulatory Visit: Payer: Self-pay | Admitting: Family Medicine

## 2019-08-13 ENCOUNTER — Ambulatory Visit: Payer: Medicare Other | Admitting: Physician Assistant

## 2019-08-13 ENCOUNTER — Ambulatory Visit (INDEPENDENT_AMBULATORY_CARE_PROVIDER_SITE_OTHER): Payer: Medicare Other | Admitting: Physician Assistant

## 2019-08-13 VITALS — BP 121/82 | HR 120 | Ht 64.0 in

## 2019-08-13 DIAGNOSIS — R339 Retention of urine, unspecified: Secondary | ICD-10-CM

## 2019-08-13 DIAGNOSIS — B3731 Acute candidiasis of vulva and vagina: Secondary | ICD-10-CM

## 2019-08-13 DIAGNOSIS — B373 Candidiasis of vulva and vagina: Secondary | ICD-10-CM | POA: Diagnosis not present

## 2019-08-13 LAB — BLADDER SCAN AMB NON-IMAGING: Scan Result: 101

## 2019-08-13 NOTE — Progress Notes (Signed)
08/13/2019 9:27 AM   [name] 07/16/1940 [id number]  CC: Postoperative urinary retention   HPI: LENNYN [name] is a 79 y.o. female who presents today for evaluation of postoperative urinary retention and voiding trial.  She is POD 11 from total right hip replacement revision with Dr. Jefferson Fuel at [facility].  Foley catheter was removed on POD 1 and the patient was unable to void thereafter.  It was reinserted on POD 2.  She is accompanied today by her daughter, [name], who contributes to HPI.  PMH notable for recurrent UTI managed by PCP, degenerative disc disease of the lumbar spine, chronic constipation, stroke, abdominal hysterectomy, and vault suspension and posterior repair with Dr. Matilde Sprang around 2012 with postoperative rectal leak.  Urine cultures dated 04/19/2019, 07/29/2019 with pansensitive E. coli.  Urine cultures dated 11/04/2018 and 12/04/2018 with ampicillin and nitrofurantoin resistant Klebsiella pneumoniae. Patient recently completed a course of Cipro for management of UTI. She is not on daily suppression.  Today, patient reports general discomfort associated with her urinary catheter.  She is anxious to have it removed today.  Additionally, she does report some vaginal pruritus.  She reports a history of vaginal candidiasis.  She denies a history of urinary retention.  She denies urinary hesitancy or difficulty at baseline.  She has not seen a urologist since undergoing rectocele repair approximately 15 years ago.  Patient takes Linzess and stool softener daily for management of chronic constipation, as well as MiraLAX and Dulcolax as needed.  On this regimen, she has a hard bowel movement approximately every 3 to 4 days.  PMH: Past Medical History:  Diagnosis Date  . Acute postoperative pain 02/03/2018  . Anemia   . B12 deficiency   . Bacteremia 10/07/2014  . Bladder incontinence   . Blind left eye   . Cancer ([place])    skin cancer   . Cataract   . Cervical spine  fracture ([place])   . Chest pain 07/29/2013  . Compression fracture    C7,  upper T spine -compression fx  . COPD (chronic obstructive pulmonary disease) ([place])   . DDD (degenerative disc disease), lumbar   . Depression    major  . Diffuse myofascial pain syndrome 03/24/2015  . Dyspnea    at times- when activty  . Fever 10/05/2014  . GERD (gastroesophageal reflux disease)   . Hiatal hernia   . Hyperlipidemia   . Hypertension   . 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 Macular degeneration   . On home oxygen therapy    "3L; all the time" (04/30/2018)  . Osteoarthritis   . Osteoporosis   . Pneumonia    years ago  . Pulmonary embolism ([place]) 04/30/2018  . Reactive airway disease   . Rupture of bowel ([place])   . Sepsis ([place]) 10/08/2014  . Stroke [facility])    "mini stroke"- balance and memory issue  . Stroke ([place])   . Wrist pain, acute 09/10/2012    Surgical History: Past Surgical History:  Procedure Laterality Date  . ABDOMINAL HYSTERECTOMY    . ABDOMINAL SURGERY    . APPENDECTOMY    . BLADDER SURGERY    . CATARACT EXTRACTION W/ INTRAOCULAR LENS  IMPLANT, BILATERAL Bilateral   . CHOLECYSTECTOMY    . COLON SURGERY    . COLOSTOMY REVERSAL    . ESOPHAGOGASTRODUODENOSCOPY N/A 10/30/2018   Procedure: ESOPHAGOGASTRODUODENOSCOPY (EGD);  Surgeon: Lin Landsman, MD;  Location: [facility] ENDOSCOPY;  Service: Gastroenterology;  Laterality: N/A;  . INTRAMEDULLARY (  IM) NAIL INTERTROCHANTERIC Right 09/13/2016   Procedure: INTRAMEDULLARY (IM) NAIL INTERTROCHANTRIC RIGHT;  Surgeon: Leandrew Koyanagi, MD;  Location: [facility];  Service: Orthopedics;  Laterality: Right;  . JOINT REPLACEMENT Bilateral   . KNEE SURGERY    . OSTOMY    . RECTOCELE REPAIR    . TOE SURGERY Right    3rd  . TOTAL HIP ARTHROPLASTY Right 09/30/2017   Procedure: RIGHT HIP IM NAIL REMOVAL WITH CONVERSION TO TOTAL HIP ARTHOPLASTY, anterior approach;  Surgeon: Renette Butters, MD;  Location: [facility];  Service: Orthopedics;   Laterality: Right;    Home Medications:  Allergies as of 08/13/2019      Reactions   Ampicillin Anaphylaxis, Nausea Only, Swelling   SEVERE HEADACHE MUSCLE CRAMPS ANGIOEDEMA THRUSH PATIENT HAS HAD A PCN REACTION WITH IMMEDIATE RASH, FACIAL/TONGUE/THROAT SWELLING, SOB, OR LIGHTHEADEDNESS WITH HYPOTENSION:  #  #  YES  #  #  Has patient had a PCN reaction causing severe rash involving mucus membranes or skin necrosis: No Has patient had a PCN reaction that required hospitalization:No Has patient had a PCN reaction occurring within the last 10 years: No.   Ampicillin-sulbactam Sodium Anaphylaxis, Other (See Comments)   SEVERE HEADACHE MUSCLE CRAMPS ANGIOEDEMA THRUSH PATIENT HAS HAD A PCN REACTION WITH IMMEDIATE RASH, FACIAL/TONGUE/THROAT SWELLING, SOB, OR LIGHTHEADEDNESS WITH HYPOTENSION: # # YES # # Has patient had a PCN reaction causing severe rash involving mucus membranes or skin necrosis: No Has patient had a PCN reaction that required hospitalization:No Has patient had a PCN reaction occurring within the last 10 years: No SEVERE HEADACHE MUSCLE CRAMPS ANGIOEDEMA THRUSH PATIENT HAS HAD A PCN REACTION WITH IMMEDIATE RASH, FACIAL/TONGUE/THROAT SWELLING, SOB, OR LIGHTHEADEDNESS WITH HYPOTENSION: # # YES # # Has patient had a PCN reaction causing severe rash involving mucus membranes or skin necrosis: No Has patient had a PCN reaction that required hospitalization:No Has patient had a PCN reaction occurring within the last 10 years: No Muscles draw up tight - severe headache angioedema   Ambien [zolpidem Tartrate] Nausea And Vomiting, Other (See Comments)   HALLUCINATIONS SWEATING   Cyclobenzaprine Other (See Comments)   EXTRAPYRAMIDAL MOVEMENT INVOLUNTARY MUSCLE JERKING EXTRAPYRAMIDAL MOVEMENT INVOLUNTARY MUSCLE JERKING Gets the jerks   Tape Itching, Rash, Other (See Comments)   Paper tape only. Adhesive tape=itching/burning/rash   Toradol [ketorolac Tromethamine] Nausea  Only, Other (See Comments)   HEADACHE  BACKACHE   Sulfamethoxazole-trimethoprim    UNSPECIFIED REACTION    Zolpidem    Hallucinations   Cephalexin Rash, Other (See Comments)   HEADACHES   Tramadol Rash, Other (See Comments)   HEADACHE       Medication List       Accurate as of August 13, 2019  9:27 AM. If you have any questions, ask your nurse or doctor.        acetaminophen 500 MG tablet Commonly known as: TYLENOL Take 2 tablets (1,000 mg total) by mouth 3 (three) times daily. What changed: when to take this   albuterol 1.25 MG/3ML nebulizer solution Commonly known as: ACCUNEB Take 3 mLs (1.25 mg total) by nebulization every 6 (six) hours as needed for wheezing.   albuterol 108 (90 Base) MCG/ACT inhaler Commonly known as: VENTOLIN HFA Inhale 2 puffs into the lungs every 6 (six) hours as needed for wheezing or shortness of breath.   alendronate 70 MG tablet Commonly known as: FOSAMAX TAKE 1 TABLET BY MOUTH ONCE A WEEK   ALPRAZolam 1 MG tablet Commonly known as: Duanne Moron  TAKE 1 TABLET (1 MG TOTAL) BY MOUTH AT BEDTIME AS NEEDED FOR ANXIETY.   amLODipine 5 MG tablet Commonly known as: NORVASC TAKE 1 TABLET BY MOUTH EVERY DAY   apixaban 2.5 MG Tabs tablet Commonly known as: Eliquis Take 1 tablet (2.5 mg total) by mouth 2 (two) times daily.   ciprofloxacin 250 MG tablet Commonly known as: CIPRO Take 1 tablet (250 mg total) by mouth 2 (two) times daily.   docusate sodium 100 MG capsule Commonly known as: COLACE Take 200 mg by mouth 2 (two) times daily as needed for moderate constipation.   DULoxetine 20 MG capsule Commonly known as: Cymbalta Take 2 capsules (40 mg total) by mouth daily.   esomeprazole 40 MG capsule Commonly known as: NEXIUM Take 1 capsule (40 mg total) by mouth daily.   fluticasone 220 MCG/ACT inhaler Commonly known as: FLOVENT HFA Inhale 1 puff into the lungs 2 (two) times daily.   fluticasone 50 MCG/ACT nasal spray Commonly known as:  FLONASE Place 2 sprays into both nostrils daily.   Linzess 290 MCG Caps capsule Generic drug: linaclotide TAKE 1 CAPSULE BY MOUTH EVERY DAY   lovastatin 10 MG tablet Commonly known as: MEVACOR TAKE 1 TABLET BY MOUTH EVERY DAY   Narcan 4 MG/0.1ML Liqd nasal spray kit Generic drug: naloxone   ondansetron 4 MG disintegrating tablet Commonly known as: Zofran ODT Take 1 tablet (4 mg total) by mouth every 8 (eight) hours as needed for nausea or vomiting.   Oxycodone HCl 10 MG Tabs Take 10 mg by mouth 3 (three) times daily.   polyethylene glycol 17 g packet Commonly known as: MIRALAX / GLYCOLAX Take 17 g by mouth daily as needed (for constipation.).   pregabalin 100 MG capsule Commonly known as: LYRICA TAKE 1 CAPSULE BY MOUTH TWICE A DAY   promethazine 12.5 MG tablet Commonly known as: PHENERGAN Take 1 tablet (12.5 mg total) by mouth every 12 (twelve) hours as needed for nausea or vomiting.   traZODone 50 MG tablet Commonly known as: DESYREL Take 0.5-1.5 tablets (25-75 mg total) by mouth at bedtime as needed for sleep.       Allergies:  Allergies  Allergen Reactions  . Ampicillin Anaphylaxis, Nausea Only and Swelling    SEVERE HEADACHE MUSCLE CRAMPS ANGIOEDEMA THRUSH PATIENT HAS HAD A PCN REACTION WITH IMMEDIATE RASH, FACIAL/TONGUE/THROAT SWELLING, SOB, OR LIGHTHEADEDNESS WITH HYPOTENSION:  #  #  YES  #  #  Has patient had a PCN reaction causing severe rash involving mucus membranes or skin necrosis: No Has patient had a PCN reaction that required hospitalization:No Has patient had a PCN reaction occurring within the last 10 years: No.  . Ampicillin-Sulbactam Sodium Anaphylaxis and Other (See Comments)    SEVERE HEADACHE MUSCLE CRAMPS ANGIOEDEMA THRUSH PATIENT HAS HAD A PCN REACTION WITH IMMEDIATE RASH, FACIAL/TONGUE/THROAT SWELLING, SOB, OR LIGHTHEADEDNESS WITH HYPOTENSION: # # YES # # Has patient had a PCN reaction causing severe rash involving mucus membranes or  skin necrosis: No Has patient had a PCN reaction that required hospitalization:No Has patient had a PCN reaction occurring within the last 10 years: No  SEVERE HEADACHE MUSCLE CRAMPS ANGIOEDEMA THRUSH PATIENT HAS HAD A PCN REACTION WITH IMMEDIATE RASH, FACIAL/TONGUE/THROAT SWELLING, SOB, OR LIGHTHEADEDNESS WITH HYPOTENSION: # # YES # # Has patient had a PCN reaction causing severe rash involving mucus membranes or skin necrosis: No Has patient had a PCN reaction that required hospitalization:No Has patient had a PCN reaction occurring within the last 10  years: No Muscles draw up tight - severe headache angioedema  . Ambien [Zolpidem Tartrate] Nausea And Vomiting and Other (See Comments)    HALLUCINATIONS SWEATING  . Cyclobenzaprine Other (See Comments)    EXTRAPYRAMIDAL MOVEMENT INVOLUNTARY MUSCLE JERKING EXTRAPYRAMIDAL MOVEMENT INVOLUNTARY MUSCLE JERKING Gets the jerks  . Tape Itching, Rash and Other (See Comments)    Paper tape only. Adhesive tape=itching/burning/rash  . Toradol [Ketorolac Tromethamine] Nausea Only and Other (See Comments)    HEADACHE  BACKACHE  . Sulfamethoxazole-Trimethoprim     UNSPECIFIED REACTION   . Zolpidem     Hallucinations  . Cephalexin Rash and Other (See Comments)    HEADACHES  . Tramadol Rash and Other (See Comments)    HEADACHE     Family History: Family History  Problem Relation Age of Onset  . Heart failure Mother   . Heart attack Father   . Lung cancer Sister   . Deep vein thrombosis Sister   . Pulmonary embolism Son   . Deep vein thrombosis Son   . Cervical cancer Daughter     Social History:   reports that she quit smoking about 8 years ago. Her smoking use included e-cigarettes and cigarettes. She has a 45.00 pack-year smoking history. She has never used smokeless tobacco. She reports that she does not drink alcohol or use drugs.  Physical Exam: Ht '5\' 4"'  (1.626 m)   BMI 29.52 kg/m   Constitutional:  Alert, no acute  distress, nontoxic appearing HEENT: [place], AT Cardiovascular: No clubbing, cyanosis, or edema Respiratory: Normal respiratory effort, no increased work of breathing GU: Slight erythema extending circumferentially around the vaginal introitus with scant cottage cheese-like discharge noted on physical exam. Skin: No rashes, bruises or suspicious lesions Neurologic: Grossly intact, no focal deficits, moving all 4 extremities Psychiatric: Normal mood and affect  Laboratory Data: Results for orders placed or performed in visit on 08/13/19  BLADDER SCAN AMB NON-IMAGING  Result Value Ref Range   Scan Result 101    Assessment & Plan:   1. Urinary retention 79 year old female presents today for voiding trial in the setting of postoperative urinary retention.  She does not have a history of urinary retention, however she does have a complex medical history that increases her risk of retention including lumbar spine problems, chronic constipation, rectocele s/p repair, stroke, and recurrent UTI.  Based on this, I believe patient is at increased risk of recurrent urinary retention moving forward.  We proceeded with voiding trial in clinic today.  Fill and pull trial completed in the a.m., see separate procedure notes for details.  She returned to clinic in the afternoon and reported having drunk approximately 34oz of fluid in the interim.  She has been able to urinate.  PVR 140m.  Voiding trial passed.  I explained to the patient today that she is at increased risk of recurrent urinary retention.  I counseled the patient on signs and symptoms of urinary retention, including lower abdominal pain, lumbar pain, abdominal distention, and the inability to urinate.  I advised her to contact the office for assistance if she develops these symptoms during routine office hours, 8 AM to 5 PM Monday through Friday.  If outside those hours, I advised her  to proceed to the emergency department.  Additionally, I  encouraged her to continue her aggressive bowel regimen for management of chronic constipation and explained that this will also decrease her risk of recurrent urinary retention.  She expressed understanding. - BLADDER SCAN AMB NON-IMAGING  2. Vaginal candidiasis Mild on physical exam today.  Patient is taking a statin.  Will defer p.o. Diflucan.  Counseled patient to purchase an OTC treatment for yeast infection from her pharmacy.  She expressed understanding.  Return if symptoms worsen or fail to improve.  Debroah Loop, PA-C  [facility] [address], [name],  [address] [phone]

## 2019-08-13 NOTE — Telephone Encounter (Signed)
Last OV 07/29/19 for acute cystitis and on 05/18/20 Last refilled 06/11/19 #30 x 1 refill.   Please advise, thanks.

## 2019-08-13 NOTE — Progress Notes (Signed)
Fill and Pull Catheter Removal  Patient is present today for a catheter removal.  Patient was cleaned and prepped in a sterile fashion 260ml of sterile saline was instilled into the bladder when the patient felt the urge to urinate. 2ml of water was then drained from the balloon.  A 81MM silicone foley cath was removed from the bladder no complications were noted .  Patient was then given some time to void on their own.  Patient can void  248ml on their own after some time.  Patient tolerated well.  Performed by: Debroah Loop, PA-C   Follow up/ Additional notes: Push fluids and RTC this afternoon for PVR.

## 2019-08-21 ENCOUNTER — Ambulatory Visit: Payer: Medicare Other

## 2019-08-23 ENCOUNTER — Telehealth: Payer: Self-pay | Admitting: Family Medicine

## 2019-08-23 NOTE — Telephone Encounter (Signed)
Patient's [name] notified of Debbie's comments and she verbalized understanding.

## 2019-08-23 NOTE — Telephone Encounter (Signed)
Because it is a post op appointment, I am unable to remove them in the office, it is necessary for her to see her orthopedic surgeon. Hope she is getting better.

## 2019-08-23 NOTE — Telephone Encounter (Signed)
Patient's [name] called today,  She stated that she would like to know if you could take the patient's stiches out.  [name] stated they are from the patient's Hip surgery   They do have an appointment on Thursday in [facility] for a post op follow up but they are unsure if they will have a ride to get her there,   Can you see the patient for this? Or is it best for her to try to see the Dr for the post op appointment for removal ?   Please advise

## 2019-08-24 ENCOUNTER — Encounter: Payer: Self-pay | Admitting: Obstetrics & Gynecology

## 2019-08-25 ENCOUNTER — Other Ambulatory Visit: Payer: Self-pay | Admitting: Family Medicine

## 2019-08-25 DIAGNOSIS — R0982 Postnasal drip: Secondary | ICD-10-CM

## 2019-08-27 ENCOUNTER — Inpatient Hospital Stay: Payer: Medicare Other | Attending: Oncology

## 2019-08-28 ENCOUNTER — Emergency Department (HOSPITAL_COMMUNITY)
Admission: EM | Admit: 2019-08-28 | Discharge: 2019-08-28 | Disposition: A | Discharge status: Home / Self Care | Payer: Medicare Other | Attending: Emergency Medicine | Admitting: Emergency Medicine

## 2019-08-28 ENCOUNTER — Emergency Department (HOSPITAL_COMMUNITY): Payer: Medicare Other

## 2019-08-28 ENCOUNTER — Other Ambulatory Visit: Payer: Self-pay | Admitting: Family Medicine

## 2019-08-28 ENCOUNTER — Other Ambulatory Visit: Payer: Self-pay

## 2019-08-28 DIAGNOSIS — S79911A Unspecified injury of right hip, initial encounter: Secondary | ICD-10-CM | POA: Diagnosis present

## 2019-08-28 DIAGNOSIS — J449 Chronic obstructive pulmonary disease, unspecified: Secondary | ICD-10-CM | POA: Diagnosis not present

## 2019-08-28 DIAGNOSIS — Z79899 Other long term (current) drug therapy: Secondary | ICD-10-CM | POA: Insufficient documentation

## 2019-08-28 DIAGNOSIS — Z87891 Personal history of nicotine dependence: Secondary | ICD-10-CM | POA: Diagnosis not present

## 2019-08-28 DIAGNOSIS — Z86718 Personal history of other venous thrombosis and embolism: Secondary | ICD-10-CM | POA: Diagnosis not present

## 2019-08-28 DIAGNOSIS — Y939 Activity, unspecified: Secondary | ICD-10-CM | POA: Diagnosis not present

## 2019-08-28 DIAGNOSIS — N183 Chronic kidney disease, stage 3 unspecified: Secondary | ICD-10-CM | POA: Diagnosis not present

## 2019-08-28 DIAGNOSIS — T84020A Dislocation of internal right hip prosthesis, initial encounter: Secondary | ICD-10-CM | POA: Insufficient documentation

## 2019-08-28 DIAGNOSIS — Y92003 Bedroom of unspecified non-institutional (private) residence as the place of occurrence of the external cause: Secondary | ICD-10-CM | POA: Diagnosis not present

## 2019-08-28 DIAGNOSIS — I129 Hypertensive chronic kidney disease with stage 1 through stage 4 chronic kidney disease, or unspecified chronic kidney disease: Secondary | ICD-10-CM | POA: Insufficient documentation

## 2019-08-28 DIAGNOSIS — W06XXXA Fall from bed, initial encounter: Secondary | ICD-10-CM | POA: Insufficient documentation

## 2019-08-28 DIAGNOSIS — Y999 Unspecified external cause status: Secondary | ICD-10-CM | POA: Insufficient documentation

## 2019-08-28 DIAGNOSIS — Z86711 Personal history of pulmonary embolism: Secondary | ICD-10-CM | POA: Insufficient documentation

## 2019-08-28 DIAGNOSIS — S0990XA Unspecified injury of head, initial encounter: Secondary | ICD-10-CM | POA: Insufficient documentation

## 2019-08-28 DIAGNOSIS — W19XXXA Unspecified fall, initial encounter: Secondary | ICD-10-CM

## 2019-08-28 DIAGNOSIS — Z7901 Long term (current) use of anticoagulants: Secondary | ICD-10-CM | POA: Insufficient documentation

## 2019-08-28 LAB — CBC WITH DIFFERENTIAL/PLATELET
Abs Immature Granulocytes: 0.01 10*3/uL (ref 0.00–0.07)
Basophils Absolute: 0.1 10*3/uL (ref 0.0–0.1)
Basophils Relative: 1 %
Eosinophils Absolute: 0.3 10*3/uL (ref 0.0–0.5)
Eosinophils Relative: 4 %
HCT: 39.2 % (ref 36.0–46.0)
Hemoglobin: 11.8 g/dL — ABNORMAL LOW (ref 12.0–15.0)
Immature Granulocytes: 0 %
Lymphocytes Relative: 33 %
Lymphs Abs: 2 10*3/uL (ref 0.7–4.0)
MCH: 27.3 pg (ref 26.0–34.0)
MCHC: 30.1 g/dL (ref 30.0–36.0)
MCV: 90.5 fL (ref 80.0–100.0)
Monocytes Absolute: 0.7 10*3/uL (ref 0.1–1.0)
Monocytes Relative: 12 %
Neutro Abs: 2.9 10*3/uL (ref 1.7–7.7)
Neutrophils Relative %: 50 %
Platelets: 274 10*3/uL (ref 150–400)
RBC: 4.33 MIL/uL (ref 3.87–5.11)
RDW: 16.9 % — ABNORMAL HIGH (ref 11.5–15.5)
WBC: 5.9 10*3/uL (ref 4.0–10.5)
nRBC: 0 % (ref 0.0–0.2)

## 2019-08-28 LAB — BASIC METABOLIC PANEL
Anion gap: 15 (ref 5–15)
BUN: 10 mg/dL (ref 8–23)
CO2: 25 mmol/L (ref 22–32)
Calcium: 9.9 mg/dL (ref 8.9–10.3)
Chloride: 101 mmol/L (ref 98–111)
Creatinine, Ser: 0.97 mg/dL (ref 0.44–1.00)
GFR calc Af Amer: >60 mL/min (ref >60)
GFR calc non Af Amer: 56 mL/min — ABNORMAL LOW (ref >60)
Glucose, Bld: 116 mg/dL — ABNORMAL HIGH (ref 70–99)
Potassium: 3.5 mmol/L (ref 3.5–5.1)
Sodium: 141 mmol/L (ref 135–145)

## 2019-08-28 MED ORDER — FENTANYL CITRATE (PF) 100 MCG/2ML IJ SOLN
100.0000 ug | Freq: Once | INTRAMUSCULAR | Status: DC
  Filled 2019-08-28: qty 2

## 2019-08-28 MED ORDER — PROPOFOL 10 MG/ML IV BOLUS
0.5000 mg/kg | Freq: Once | INTRAVENOUS | Status: DC
  Filled 2019-08-28: qty 20

## 2019-08-28 MED ORDER — FENTANYL CITRATE (PF) 100 MCG/2ML IJ SOLN
INTRAMUSCULAR | Status: AC
  Filled 2019-08-28: qty 2

## 2019-08-28 MED ORDER — FENTANYL CITRATE (PF) 100 MCG/2ML IJ SOLN
100.0000 ug | Freq: Once | INTRAMUSCULAR | Status: AC
  Administered 2019-08-28: 100 ug via INTRAVENOUS

## 2019-08-28 MED ORDER — FENTANYL CITRATE (PF) 100 MCG/2ML IJ SOLN
INTRAMUSCULAR | Status: AC | PRN
  Administered 2019-08-28: 100 ug via INTRAVENOUS

## 2019-08-28 MED ORDER — PROPOFOL 10 MG/ML IV BOLUS
INTRAVENOUS | Status: AC | PRN
  Administered 2019-08-28: 39.7 mg via INTRAVENOUS

## 2019-08-28 NOTE — ED Notes (Signed)
EDP @ bedside @ [phone]

## 2019-08-28 NOTE — ED Notes (Signed)
[facility] has arrived to transport pt home - Pt spoke w/family member who is home. Pt states she has all of her belongings. D/C instructions given and questions answered to satisfaction - Pt unable to sign sig pad.

## 2019-08-28 NOTE — ED Notes (Addendum)
Procedural consent signed by patient and this RN, ambu bag, suction, airway cart all set up, Respiratory called to bedside in preparation for procedural sedation.

## 2019-08-28 NOTE — ED Notes (Signed)
Pt arrived to Rm 50 via stretcher. Purewick intact. Pt voided w/o difficulty. O2 sat 93% on room air. Pt denies any c/o pain at this time. Aware waiting for PTAR.

## 2019-08-28 NOTE — ED Triage Notes (Addendum)
Pt coming in after falling this morning from bed. Pt got stuck in comforter and slipped out of bed. R leg was bent backward per EMS upon arrival. Reported that pt has a high pain tolerance. Recently had surgery on same hip that she fell on. Pt unsure if she is still on Eliquis. No LOC. Pt alert and oriented. Given 14.5 mg of Ketamine and 100 mcg Fentanyl with EMS w/o much pain relief

## 2019-08-28 NOTE — ED Notes (Signed)
Meal ordered for PT 

## 2019-08-28 NOTE — ED Notes (Signed)
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

## 2019-08-28 NOTE — ED Notes (Signed)
Radiology called for portable R hip films to be obtained post reduction

## 2019-08-28 NOTE — ED Provider Notes (Signed)
[facility] EMERGENCY DEPARTMENT Provider Note   CSN: [id number] Arrival date & time: 08/28/19  0132   History Chief complaint: [name] is a 79 y.o. female.  The history is provided by the patient.  She has history of hypertension, hyperlipidemia, stroke, pulmonary embolism and is brought in by ambulance after falling at home.  She is complaining of pain in her right hip and right knee.  She denies loss of consciousness.  Her medication list includes rivaroxaban, but she is not sure if she is still taking it or not.  Because of fall on anticoagulants, level 2 trauma activation was initiated.  Past Medical History:  Diagnosis Date  . Acute postoperative pain 02/03/2018  . Anemia   . B12 deficiency   . Bacteremia 10/07/2014  . Bladder incontinence   . Blind left eye   . Cancer ([place])    skin cancer   . Cataract   . Cervical spine fracture ([place])   . Chest pain 07/29/2013  . Compression fracture    C7,  upper T spine -compression fx  . COPD (chronic obstructive pulmonary disease) ([place])   . DDD (degenerative disc disease), lumbar   . Depression    major  . Diffuse myofascial pain syndrome 03/24/2015  . Dyspnea    at times- when activty  . Fever 10/05/2014  . GERD (gastroesophageal reflux disease)   . Hiatal hernia   . Hyperlipidemia   . Hypertension   . 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 Macular degeneration   . On home oxygen therapy    "3L; all the time" (04/30/2018)  . Osteoarthritis   . Osteoporosis   . Pneumonia    years ago  . Pulmonary embolism ([place]) 04/30/2018  . Reactive airway disease   . Rupture of bowel ([place])   . Sepsis ([place]) 10/08/2014  . Stroke [facility])    "mini stroke"- balance and memory issue  . Stroke ([place])   . Wrist pain, acute 09/10/2012    Patient Active Problem List   Diagnosis Date Noted  . Acute cystitis 07/29/2019  . Non-intractable vomiting 05/19/2019  . Anemia 05/04/2019  . History of deep vein  thrombosis of lower extremity 01/05/2019  . GI bleed 10/29/2018  . ILD (interstitial lung disease) ([place]) 08/12/2018  . COPD exacerbation ([place]) 05/17/2018  . Pressure injury of skin 05/04/2018  . PE (pulmonary thromboembolism) ([place]) 04/30/2018  . Acute metabolic encephalopathy   . Respiratory failure with hypoxia ([place]) 04/20/2018  . CAP (community acquired pneumonia) 04/20/2018  . Acute lower UTI 04/20/2018  . Chronic lower extremity pain (Right) 04/07/2018  . Abnormal MRI, lumbar spine 04/07/2018  . Lumbar facet hypertrophy (Multilevel) (Bilateral) 04/07/2018  . Lumbar foraminal stenosis (L3-4, L4-5) (Bilateral) 04/07/2018  . Annular tear of lumbar disc (L3-4, L4-5) 04/07/2018  . Closed hip fracture, sequela (Right) 02/03/2018  . It band syndrome, right 12/18/2017  . History of hip replacement (Right) 12/18/2017  . Ileus ([place]) 10/27/2017  . Primary osteoarthritis of hip 09/30/2017  . History of small bowel obstruction 09/10/2017  . 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 SBO (small bowel obstruction) ([place]) 08/24/2017  . Abnormal x-ray of pelvis 08/13/2017  . Other specified dorsopathies, sacral and sacrococcygeal region 08/04/2017  . Spondylosis without myelopathy or radiculopathy, lumbosacral region 07/17/2017  . Non-traumatic compression fracture of T3 thoracic vertebra, sequela 07/02/2017  . High risk medication use 07/02/2017  . At high risk for falls 07/02/2017  .  Chronic sacroiliac joint pain (Right) 07/02/2017  . CKD (chronic kidney disease) stage 3, GFR 30-59 ml/min 04/23/2017  . Chronic knee pain s/p total knee replacement (TKR) (Right) 04/02/2017  . DDD (degenerative disc disease), lumbar 03/05/2017  . Chronic knee pain (Bilateral) (R>L) 03/05/2017  . Osteoarthritis 03/05/2017  . Chronic musculoskeletal pain 03/05/2017  . Neurogenic pain 03/05/2017  . Chronic myofascial pain 02/12/2017  . Lumbar facet syndrome (Bilateral) (L>R) 02/12/2017  . Long  term prescription benzodiazepine use 02/12/2017  . Disorder of skeletal system 02/12/2017  . Pharmacologic therapy 02/12/2017  . Problems influencing health status 02/12/2017  . 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 NSAID long-term use 02/12/2017  . DDD (degenerative disc disease), cervical 11/11/2016  . Lumbar facet arthropathy (Bilateral) 11/11/2016  . 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 CAFL (chronic airflow limitation) ([place]) 11/07/2016  . 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 Long term current use of opiate analgesic 11/07/2016  . Long term prescription opiate use 11/07/2016  . Chronic pain syndrome 11/07/2016  . Chronic neck pain (Secondary Area of Pain) (Bilateral) (L>R) 11/07/2016  . Chronic hip pain (Right) 11/07/2016  . Age-related osteoporosis with current pathological fracture with routine healing 09/20/2016  . History of stroke 09/20/2016  . Intertrochanteric fracture of femur, sequela (Right) 09/13/2016  . Chronic shoulder pain (Bilateral) 08/12/2016  . Hx of total knee replacement (Bilateral) 04/04/2016  . Chronic shoulder pain (Left) 02/28/2016  . Hoarseness 01/12/2016  . Pharyngoesophageal dysphagia 01/12/2016  . Chronic low back pain [facility] Area of Pain) (Bilateral) (L>R) 10/26/2015  . 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 Syncope, non cardiac 05/22/2015  . Thrombocytopenia ([place]) 10/07/2014  . Hypoxia 10/05/2014  . Anxiety 06/15/2014  . Duodenogastric reflux 06/15/2014  . Benign essential HTN 06/15/2014  . Hypercholesteremia 07/30/2013  . HTN (hypertension) 07/30/2013  . GERD (gastroesophageal reflux disease) 07/30/2013  . Memory loss 05/03/2013  . Chronic knee pain (Primary Area of Pain) (Right) 09/10/2012    Past Surgical History:  Procedure Laterality Date  . ABDOMINAL HYSTERECTOMY    .  ABDOMINAL SURGERY    . APPENDECTOMY    . BLADDER SURGERY    . CATARACT EXTRACTION W/ INTRAOCULAR LENS  IMPLANT, BILATERAL Bilateral   . CHOLECYSTECTOMY    . COLON SURGERY    . COLOSTOMY REVERSAL    . ESOPHAGOGASTRODUODENOSCOPY N/A 10/30/2018   Procedure: ESOPHAGOGASTRODUODENOSCOPY (EGD);  Surgeon: Lin Landsman, MD;  [facility] ENDOSCOPY;  Service: Gastroenterology;  Laterality: N/A;  . INTRAMEDULLARY (IM) NAIL INTERTROCHANTERIC Right 09/13/2016   Procedure: INTRAMEDULLARY (IM) NAIL INTERTROCHANTRIC RIGHT;  Surgeon: Leandrew Koyanagi, MD;  Location: [place];  Service: Orthopedics;  Laterality: Right;  . JOINT REPLACEMENT Bilateral   . KNEE SURGERY    . OSTOMY    . RECTOCELE REPAIR    . TOE SURGERY Right    3rd  . TOTAL HIP ARTHROPLASTY Right 09/30/2017   Procedure: RIGHT HIP IM NAIL REMOVAL WITH CONVERSION TO TOTAL HIP ARTHOPLASTY, anterior approach;  Surgeon: Renette Butters, MD;  Location: [place];  Service: Orthopedics;  Laterality: Right;     OB History    Gravida  0   Para  0   Term  0   Preterm  0   AB  0   Living        SAB  0   TAB  0   Ectopic  0   Multiple      Live Births              Family History  Problem Relation Age of Onset  . Heart failure Mother   . Heart attack Father   . Lung cancer Sister   . Deep vein thrombosis Sister   . Pulmonary embolism Son   . Deep vein thrombosis Son   . Cervical cancer Daughter     Social History   Tobacco Use  . Smoking status: Former Smoker    Packs/day: 1.50    Years: 30.00    Pack years: 45.00    Types: E-cigarettes, Cigarettes    Quit date: 2013    Years since quitting: 8.2  . Smokeless tobacco: Never Used  Substance Use Topics  . Alcohol use: No    Alcohol/week: 0.0 standard drinks  . Drug use: No    Home Medications Prior to Admission medications   Medication Sig Start Date End Date Taking? Authorizing Provider  acetaminophen (TYLENOL) 500 MG tablet Take 2 tablets (1,000 mg total) by  mouth 3 (three) times daily. Patient taking differently: Take 1,000 mg by mouth 2 (two) times daily.  08/25/18   Ria Bush, MD  albuterol (ACCUNEB) 1.25 MG/3ML nebulizer solution Take 3 mLs (1.25 mg total) by nebulization every 6 (six) hours as needed for wheezing. 08/26/18   [name], FNP  albuterol (VENTOLIN HFA) 108 (90 Base) MCG/ACT inhaler Inhale 2 puffs into the lungs every 6 (six) hours as needed for wheezing or shortness of breath. 09/25/18   [name], FNP  alendronate (FOSAMAX) 70 MG tablet TAKE 1 TABLET BY MOUTH ONCE A WEEK 06/09/19   [name], FNP  ALPRAZolam Duanne Moron) 1 MG tablet TAKE 1 TABLET (1 MG TOTAL) BY MOUTH AT BEDTIME AS NEEDED FOR ANXIETY. 08/15/19   [name], FNP  amLODipine (NORVASC) 5 MG tablet TAKE 1 TABLET BY MOUTH EVERY DAY 03/29/19   [name], FNP  apixaban (ELIQUIS) 2.5 MG TABS tablet Take 1 tablet (2.5 mg total) by mouth 2 (two) times daily. 05/04/19   Earlie Server, MD  docusate sodium (COLACE) 100 MG capsule Take 200 mg by mouth 2 (two) times daily as needed for moderate constipation.     [provider]  DULoxetine (CYMBALTA) 20 MG capsule Take 2 capsules (40 mg total) by mouth daily. 04/30/19   [name], FNP  esomeprazole (NEXIUM) 40 MG capsule Take 1 capsule (40 mg total) by mouth daily. 03/24/19   [name], FNP  fluticasone (FLONASE) 50 MCG/ACT nasal spray SPRAY 2 SPRAYS INTO EACH NOSTRIL EVERY DAY 08/25/19   [name], FNP  fluticasone (FLOVENT HFA) 220 MCG/ACT inhaler Inhale 1 puff into the lungs 2 (two) times daily. 09/25/18   [name], FNP  LINZESS 290 MCG CAPS capsule TAKE 1 CAPSULE BY MOUTH EVERY DAY 02/26/19   [name], FNP  lovastatin (MEVACOR) 10 MG tablet TAKE 1 TABLET BY MOUTH EVERY DAY 06/23/19   [name], FNP  NARCAN 4 MG/0.1ML LIQD nasal spray kit  09/10/18   [provider]  ondansetron (ZOFRAN ODT) 4 MG disintegrating tablet Take 1 tablet  (4 mg total) by mouth every 8 (eight) hours as needed for nausea or vomiting. 06/12/18   [name], FNP  Oxycodone HCl 10 MG TABS Take 10 mg by mouth 3 (three) times daily. 09/23/18   [provider]  polyethylene glycol (MIRALAX /  GLYCOLAX) packet Take 17 g by mouth daily as needed (for constipation.).     [provider]  pregabalin (LYRICA) 100 MG capsule TAKE 1 CAPSULE BY MOUTH TWICE A DAY 06/11/19   [name], FNP  promethazine (PHENERGAN) 12.5 MG tablet Take 1 tablet (12.5 mg total) by mouth every 12 (twelve) hours as needed for nausea or vomiting. 07/21/19   [name], FNP  traZODone (DESYREL) 50 MG tablet Take 0.5-1.5 tablets (25-75 mg total) by mouth at bedtime as needed for sleep. 06/25/19   [name], FNP    Allergies    Ampicillin, Ampicillin-sulbactam sodium, Ambien [zolpidem tartrate], Cyclobenzaprine, Tape, Toradol [ketorolac tromethamine], Sulfamethoxazole-trimethoprim, Zolpidem, Cephalexin, and Tramadol  Review of Systems   Review of Systems  All other systems reviewed and are negative.   Physical Exam Updated Vital Signs BP (!) 151/85   Pulse (!) 105   Resp 18   Ht '5\' 7"'$  (1.702 m)   Wt 79.4 kg   SpO2 95%   BMI 27.41 kg/m   Physical Exam Vitals and nursing note reviewed.   79 year old female, resting comfortably and in no acute distress. Vital signs are significant for elevated heart rate and blood pressure. Oxygen saturation is 95%, which is normal. Head is normocephalic and atraumatic. PERRLA, EOMI. Oropharynx is clear. Neck is nontender without adenopathy or JVD. Back is nontender and there is no CVA tenderness. Lungs are clear without rales, wheezes, or rhonchi. Chest is nontender. Heart has regular rate and rhythm without murmur. Abdomen is soft, flat, nontender without masses or hepatosplenomegaly and peristalsis is normoactive. Pelvis is stable and nontender. Extremities: Right leg is shortened and  internally rotated consistent with a hip dislocation.  There is tenderness palpation over the right distal thigh, but no tenderness over the knee and no swelling of the knee.  Distal neurovascular exam is intact with strong pulses, prompt capillary refill, normal sensation.  Remainder of extremity exam was unremarkable. Skin is warm and dry without rash. Neurologic: Mental status is normal, cranial nerves are intact, there are no motor or sensory deficits.  ED Results / Procedures / Treatments   Labs (all labs ordered are listed, but only abnormal results are displayed) Labs Reviewed  BASIC METABOLIC PANEL - Abnormal; Notable for the following components:      Result Value   Glucose, Bld 116 (*)    GFR calc non Af Amer 56 (*)    All other components within normal limits  CBC WITH DIFFERENTIAL/PLATELET - Abnormal; Notable for the following components:   Hemoglobin 11.8 (*)    RDW 16.9 (*)    All other components within normal limits    Radiology CT Head Wo Contrast  Result Date: 08/28/2019 CLINICAL DATA:  Fall EXAM: CT HEAD WITHOUT CONTRAST CT CERVICAL SPINE WITHOUT CONTRAST TECHNIQUE: Multidetector CT imaging of the head and cervical spine was performed following the standard protocol without intravenous contrast. Multiplanar CT image reconstructions of the cervical spine were also generated. COMPARISON:  None. FINDINGS: CT HEAD FINDINGS Brain: There is no mass, hemorrhage or extra-axial collection. There is generalized atrophy without lobar predilection. There is hypoattenuation of the periventricular white matter, most commonly indicating chronic ischemic microangiopathy. Vascular: No abnormal hyperdensity of the major intracranial arteries or dural venous sinuses. No intracranial atherosclerosis. Skull: Right frontal scalp hematoma.  No skull fracture. Sinuses/Orbits: No fluid levels or advanced mucosal thickening of the visualized paranasal sinuses. No mastoid or middle ear effusion. The  orbits are normal.  CT CERVICAL SPINE FINDINGS Alignment: No static subluxation. Facets are aligned. Occipital condyles are normally positioned. Skull base and vertebrae: No acute fracture. Soft tissues and spinal canal: No prevertebral fluid or swelling. No visible canal hematoma. Disc levels: No advanced spinal canal or neural foraminal stenosis. Upper chest: No pneumothorax, pulmonary nodule or pleural effusion. Other: Normal visualized paraspinal cervical soft tissues. IMPRESSION: 1. No acute intracranial abnormality. 2. Right frontal scalp hematoma without skull fracture. 3. No acute fracture or static subluxation of the cervical spine. Electronically Signed   By: Ulyses Jarred M.D.   On: 08/28/2019 03:04   CT Cervical Spine Wo Contrast  Result Date: 08/28/2019 CLINICAL DATA:  Fall EXAM: CT HEAD WITHOUT CONTRAST CT CERVICAL SPINE WITHOUT CONTRAST TECHNIQUE: Multidetector CT imaging of the head and cervical spine was performed following the standard protocol without intravenous contrast. Multiplanar CT image reconstructions of the cervical spine were also generated. COMPARISON:  None. FINDINGS: CT HEAD FINDINGS Brain: There is no mass, hemorrhage or extra-axial collection. There is generalized atrophy without lobar predilection. There is hypoattenuation of the periventricular white matter, most commonly indicating chronic ischemic microangiopathy. Vascular: No abnormal hyperdensity of the major intracranial arteries or dural venous sinuses. No intracranial atherosclerosis. Skull: Right frontal scalp hematoma.  No skull fracture. Sinuses/Orbits: No fluid levels or advanced mucosal thickening of the visualized paranasal sinuses. No mastoid or middle ear effusion. The orbits are normal. CT CERVICAL SPINE FINDINGS Alignment: No static subluxation. Facets are aligned. Occipital condyles are normally positioned. Skull base and vertebrae: No acute fracture. Soft tissues and spinal canal: No prevertebral fluid or  swelling. No visible canal hematoma. Disc levels: No advanced spinal canal or neural foraminal stenosis. Upper chest: No pneumothorax, pulmonary nodule or pleural effusion. Other: Normal visualized paraspinal cervical soft tissues. IMPRESSION: 1. No acute intracranial abnormality. 2. Right frontal scalp hematoma without skull fracture. 3. No acute fracture or static subluxation of the cervical spine. Electronically Signed   By: Ulyses Jarred M.D.   On: 08/28/2019 03:04   DG Pelvis Portable  Result Date: 08/28/2019 CLINICAL DATA:  Fall EXAM: PORTABLE PELVIS 1-2 VIEWS COMPARISON:  None. FINDINGS: Superior dislocation of the right total hip arthroplasty. No fracture. 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 Electronically Signed   By: Ulyses Jarred M.D.   On: 08/28/2019 02:26   DG Hip Port [place] W or [place] Pelvis 1 View Right  Result Date: 08/28/2019 CLINICAL DATA:  Post reduction. EXAM: DG HIP (WITH OR WITHOUT PELVIS) 1V PORT RIGHT COMPARISON:  Plain film from earlier same day. FINDINGS: Femoral head component of the prosthesis is now well positioned relative to the acetabular cup. Osseous alignment appears anatomic. No osseous fracture line or displaced fracture fragment seen. IMPRESSION: Normal alignment of the RIGHT hip prosthesis status post reduction. Electronically Signed   By: Franki Cabot M.D.   On: 08/28/2019 04:25   DG FEMUR PORT, 1V RIGHT  Result Date: 08/28/2019 CLINICAL DATA:  Fall EXAM: RIGHT FEMUR PORTABLE 1 VIEW COMPARISON:  None. 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 No femoral fracture. Unremarkable appearance of right total knee arthroplasty. 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 No femoral fracture. Electronically Signed   By: Ulyses Jarred M.D.   On: 08/28/2019 02:27    Procedures .Sedation  Date/Time: 08/28/2019 4:26 AM Performed by: Delora Fuel, MD Authorized by: Delora Fuel, MD   Consent:     Consent obtained:  Verbal   Consent given by:  Patient  Risks discussed:  Allergic reaction, dysrhythmia, inadequate sedation, nausea, prolonged hypoxia resulting in organ damage, prolonged sedation necessitating reversal, respiratory compromise necessitating ventilatory assistance and intubation and vomiting   Alternatives discussed:  Analgesia without sedation, anxiolysis and regional anesthesia Universal protocol:    Procedure explained and questions answered to patient or proxy's satisfaction: yes     Relevant documents present and verified: yes     Test results available and properly labeled: yes     Imaging studies available: yes     Required blood products, implants, devices, and special equipment available: yes     Site/side marked: yes     Immediately prior to procedure a time out was called: yes     Patient identity confirmation method:  Verbally with patient and arm band Indications:    Procedure performed:  Dislocation reduction   Procedure necessitating sedation performed by:  Physician performing sedation Pre-sedation assessment:    Time since last food or drink:  6 hours   ASA classification: class 2 - patient with mild systemic disease     Neck mobility: normal     Mouth opening:  3 or more finger widths   Thyromental distance:  4 finger widths   Mallampati score:  I - soft palate, uvula, fauces, pillars visible   Pre-sedation assessments completed and reviewed: airway patency, cardiovascular function, hydration status, mental status, nausea/vomiting, pain level, respiratory function and temperature     Pre-sedation assessment completed:  08/28/2019 4:05 AM Immediate pre-procedure details:    Reassessment: Patient reassessed immediately prior to procedure     Reviewed: vital signs, relevant labs/tests and NPO status     Verified: bag valve mask available, emergency equipment available, intubation equipment available, IV patency confirmed, oxygen available and suction  available   Procedure details (see MAR for exact dosages):    Preoxygenation:  Nasal cannula   Sedation:  Propofol   Intended level of sedation: deep   Intra-procedure monitoring:  Blood pressure monitoring, cardiac monitor, continuous pulse oximetry, frequent LOC assessments, frequent vital sign checks and continuous capnometry   Intra-procedure events: none     Total Provider sedation time (minutes):  30 Post-procedure details:    Post-sedation assessment completed:  08/28/2019 4:35 AM   Attendance: Constant attendance by certified staff until patient recovered     Recovery: Patient returned to pre-procedure baseline     Post-sedation assessments completed and reviewed: airway patency, cardiovascular function, hydration status, mental status, nausea/vomiting, pain level, respiratory function and temperature     Patient is stable for discharge or admission: yes     Patient tolerance:  Tolerated well, no immediate complications Reduction of dislocation  Date/Time: 08/28/2019 4:27 AM Performed by: Delora Fuel, MD Authorized by: Delora Fuel, MD  Consent: Written consent obtained. Risks and benefits: risks, benefits and alternatives were discussed Consent given by: patient Patient understanding: patient states understanding of the procedure being performed Patient consent: the patient's understanding of the procedure matches consent given Procedure consent: procedure consent matches procedure scheduled Relevant documents: relevant documents present and verified Test results: test results available and properly labeled Site marked: the operative site was marked Imaging studies: imaging studies available Required items: required blood products, implants, devices, and special equipment available Patient identity confirmed: verbally with patient and arm band Time out: Immediately prior to procedure a "time out" was called to verify the correct patient, procedure, equipment, support staff and  site/side marked as required. Local anesthesia used: no  Anesthesia: Local anesthesia used: no  Sedation:  Patient sedated: yes Sedation type: (Deep) Sedatives: propofol Analgesia: fentanyl and see MAR for details Sedation start date/time: 08/28/2019 4:05 AM Sedation end date/time: 08/28/2019 4:35 AM Vitals: Vital signs were monitored during sedation.  Patient tolerance: patient tolerated the procedure well with no immediate complications Comments: Dislocated right hip reduced by traction.  Postreduction x-ray shows adequate reduction.  Knee immobilizer placed.     CRITICAL CARE Performed by: Delora Fuel Total critical care time: 40 minutes Critical care time was exclusive of separately billable procedures and treating other patients. Critical care was necessary to treat or prevent imminent or life-threatening deterioration. Critical care was time spent personally by me on the following activities: development of treatment plan with patient and/or surrogate as well as nursing, discussions with consultants, evaluation of patient's response to treatment, examination of patient, obtaining history from patient or surrogate, ordering and performing treatments and interventions, ordering and review of laboratory studies, ordering and review of radiographic studies, pulse oximetry and re-evaluation of patient's condition.  Medications Ordered in ED Medications  propofol (DIPRIVAN) 10 mg/mL bolus/IV push 39.7 mg (39.7 mg Intravenous See Procedure Record 08/28/19 0405)  fentaNYL (SUBLIMAZE) injection 100 mcg (100 mcg Intravenous See Procedure Record 08/28/19 0406)  fentaNYL (SUBLIMAZE) injection 100 mcg (100 mcg Intravenous Given 08/28/19 0217)  fentaNYL (SUBLIMAZE) injection (100 mcg Intravenous Given 08/28/19 0406)  propofol (DIPRIVAN) 10 mg/mL bolus/IV push (39.7 mg Intravenous Given 08/28/19 0405)    ED Course  I have reviewed the triage vital signs and the nursing notes.  Pertinent labs & imaging  results that were available during my care of the patient were reviewed by me and considered in my medical decision making (see chart for details).  Fall at home with injury to right leg suspicious for prosthetic hip dislocation.  Old records are reviewed, and she had been admitted to [facility] last month for revision of right total hip arthroplasty.  Because she is anticoagulated, she is sent for CT of head and cervical spine.  X-rays confirm dislocation of right hip prosthesis.  No evidence of femoral shaft fracture.  CT of head and cervical spine showed right frontal scalp hematoma but no intracranial injury and no bony injury.  Right hip dislocation was reduced using procedural sedation with propofol.  Postreduction x-ray shows adequate reduction.  Knee immobilizer was placed.  I have contacted the patient's daughter, [name], to inform her of the diagnosis and treatment.  She will be discharged with instructions to follow-up with her orthopedic surgeon at [facility].  MDM Rules/Calculators/A&P   Final Clinical Impression(s) / ED Diagnoses Final diagnoses:  Fall at home, initial encounter  Dislocation of internal right hip prosthesis, initial encounter [facility])    Rx / DC Orders ED Discharge Orders    None       Delora Fuel, MD 38/25/05 [phone]

## 2019-08-28 NOTE — Progress Notes (Signed)
Chaplain responded to alert.  Chaplain not needed at this time.  Chaplain touched based with EMT who had spoken to daughter.  Chaplain will follow-up as needed.

## 2019-08-28 NOTE — ED Notes (Signed)
Ortho at bedside to assist with reduction of R hip

## 2019-08-28 NOTE — ED Notes (Signed)
Level II activated due to uncertainty if pt is still taking Eliquis. MD aware

## 2019-08-28 NOTE — Discharge Instructions (Addendum)
Keep the knee immobilizer on at all times until you see your orthopedic doctor.

## 2019-08-30 ENCOUNTER — Other Ambulatory Visit: Payer: Self-pay

## 2019-08-30 ENCOUNTER — Inpatient Hospital Stay (HOSPITAL_BASED_OUTPATIENT_CLINIC_OR_DEPARTMENT_OTHER): Payer: Medicare Other | Admitting: Oncology

## 2019-08-30 ENCOUNTER — Encounter: Payer: Self-pay | Admitting: Oncology

## 2019-08-30 DIAGNOSIS — I82A23 Chronic embolism and thrombosis of axillary vein, bilateral: Secondary | ICD-10-CM

## 2019-08-30 DIAGNOSIS — Z86718 Personal history of other venous thrombosis and embolism: Secondary | ICD-10-CM

## 2019-08-30 DIAGNOSIS — D649 Anemia, unspecified: Secondary | ICD-10-CM

## 2019-08-30 DIAGNOSIS — I2699 Other pulmonary embolism without acute cor pulmonale: Secondary | ICD-10-CM | POA: Diagnosis not present

## 2019-08-30 DIAGNOSIS — R040 Epistaxis: Secondary | ICD-10-CM

## 2019-08-30 NOTE — Progress Notes (Signed)
HEMATOLOGY-ONCOLOGY TeleHEALTH VISIT PROGRESS NOTE  I connected with [name] on 08/30/19 at  2:30 PM EDT by video enabled telemedicine visit and verified that I am speaking with the correct person using two identifiers. I discussed the limitations, risks, security and privacy concerns of performing an evaluation and management service by telemedicine and the availability of in-person appointments. I also discussed with the patient that there may be a patient responsible charge related to this service. The patient expressed understanding and agreed to proceed.   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   INTERVAL HISTORY [name] is a 78 y.o. 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 Problems and complaints are listed below:  A virtual visit, patient was able to see me and hear me.  I can only hear patient's not able to see her.  Continue difficulty with patient's camera. Patient was taking Eliquis 2.5 mg twice daily and to March 2021, patient has experienced intermittent nosebleeds since Covid 19 swab for total hip replacement. Patient was advised to decrease to Eliquis 2.5 mg daily until she sees me today. Also report very mild blood-tinged sputum yesterday.  Otherwise she has no new complaints.  Review of Systems  Constitutional: Negative for appetite change, chills, fatigue and fever.  HENT:   Positive for nosebleeds. Negative for hearing loss and voice change.   Eyes: Negative for eye problems.  Respiratory: Negative for chest tightness and cough.   Cardiovascular: Negative for chest pain.  Gastrointestinal: Negative for abdominal distention,  abdominal pain and blood in stool.  Endocrine: Negative for hot flashes.  Genitourinary: Negative for difficulty urinating and frequency.   Musculoskeletal: Negative for arthralgias.  Skin: Negative for itching and rash.  Neurological: Negative for extremity weakness.  Hematological: Negative for adenopathy.  Psychiatric/Behavioral: Negative for confusion.    Past Medical History:  Diagnosis Date  . Acute postoperative pain 02/03/2018  . Anemia   . B12 deficiency   . Bacteremia 10/07/2014  . Bladder incontinence   . Blind left eye   . Cancer ([place])    skin cancer   . Cataract   . Cervical spine fracture ([place])   . Chest pain 07/29/2013  . Compression fracture    C7,  upper T spine -compression fx  . COPD (chronic obstructive pulmonary disease) ([place])   . DDD (degenerative disc disease), lumbar   . Depression    major  . Diffuse myofascial pain syndrome 03/24/2015  . Dyspnea    at times- when activty  . Fever 10/05/2014  . GERD (gastroesophageal reflux disease)   . Hiatal hernia   . Hyperlipidemia   . Hypertension   . 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 Macular degeneration   . On home oxygen therapy    "3L; all the time" (04/30/2018)  . Osteoarthritis   . Osteoporosis   . Pneumonia    years ago  . Pulmonary embolism ([place]) 04/30/2018  . Reactive airway disease   . Rupture of bowel ([place])   . Sepsis ([place]) 10/08/2014  . Stroke [facility])    "mini stroke"- balance and memory issue  . Stroke ([name])   . Wrist pain, acute 09/10/2012  Past Surgical History:  Procedure Laterality Date  . ABDOMINAL HYSTERECTOMY    . ABDOMINAL SURGERY    . APPENDECTOMY    . BLADDER SURGERY    . CATARACT EXTRACTION W/ INTRAOCULAR LENS  IMPLANT, BILATERAL Bilateral   . CHOLECYSTECTOMY    . COLON SURGERY    . COLOSTOMY REVERSAL    . ESOPHAGOGASTRODUODENOSCOPY N/A 10/30/2018   Procedure: ESOPHAGOGASTRODUODENOSCOPY (EGD);  Surgeon: Lin Landsman, MD;  Location: [facility] ENDOSCOPY;   Service: Gastroenterology;  Laterality: N/A;  . INTRAMEDULLARY (IM) NAIL INTERTROCHANTERIC Right 09/13/2016   Procedure: INTRAMEDULLARY (IM) NAIL INTERTROCHANTRIC RIGHT;  Surgeon: Leandrew Koyanagi, MD;  Location: [place];  Service: Orthopedics;  Laterality: Right;  . JOINT REPLACEMENT Bilateral   . KNEE SURGERY    . OSTOMY    . RECTOCELE REPAIR    . TOE SURGERY Right    3rd  . TOTAL HIP ARTHROPLASTY Right 09/30/2017   Procedure: RIGHT HIP IM NAIL REMOVAL WITH CONVERSION TO TOTAL HIP ARTHOPLASTY, anterior approach;  Surgeon: Renette Butters, MD;  Location: [place];  Service: Orthopedics;  Laterality: Right;    Family History  Problem Relation Age of Onset  . Heart failure Mother   . Heart attack Father   . Lung cancer Sister   . Deep vein thrombosis Sister   . Pulmonary embolism Son   . Deep vein thrombosis Son   . Cervical cancer Daughter     Social History   Socioeconomic History  . Marital status: Widowed    Spouse name: Not on file  . Number of children: 3  . Years of education: middle sch  . Highest education level: Not on file  Occupational History  . Occupation: retired    Comment: n/a  Tobacco Use  . Smoking status: Former Smoker    Packs/day: 1.50    Years: 30.00    Pack years: 45.00    Types: E-cigarettes, Cigarettes    Quit date: 2013    Years since quitting: 8.2  . Smokeless tobacco: Never Used  Substance and Sexual Activity  . Alcohol use: No    Alcohol/week: 0.0 standard drinks  . Drug use: No  . Sexual activity: Not Currently  Other Topics Concern  . Not on file  Social History Narrative   ** Merged History Encounter **       Patient lives at home with daughter. Caffeine Use: 4 cups daily   Social Determinants of Health   Financial Resource Strain:   . Difficulty of Paying Living Expenses:   Food Insecurity:   . Worried About Charity fundraiser in the Last Year:   . Arboriculturist in the Last Year:   Transportation Needs:   . Lexicographer (Medical):   Marland Kitchen Lack of Transportation (Non-Medical):   Physical Activity:   . Days of Exercise per Week:   . Minutes of Exercise per Session:   Stress:   . Feeling of Stress :   Social Connections:   . Frequency of Communication with Friends and Family:   . Frequency of Social Gatherings with Friends and Family:   . Attends Religious Services:   . Active Member of Clubs or Organizations:   . Attends Archivist Meetings:   Marland Kitchen Marital Status:   Intimate Partner Violence:   . Fear of Current or Ex-Partner:   . Emotionally Abused:   Marland Kitchen Physically Abused:   . Sexually Abused:     Current Outpatient Medications on File  Prior to Visit  Medication Sig Dispense Refill  . acetaminophen (TYLENOL) 500 MG tablet Take 2 tablets (1,000 mg total) by mouth 3 (three) times daily. (Patient taking differently: Take 1,000 mg by mouth 2 (two) times daily. ) 30 tablet 0  . albuterol (ACCUNEB) 1.25 MG/3ML nebulizer solution Take 3 mLs (1.25 mg total) by nebulization every 6 (six) hours as needed for wheezing. 75 mL 1  . albuterol (VENTOLIN HFA) 108 (90 Base) MCG/ACT inhaler Inhale 2 puffs into the lungs every 6 (six) hours as needed for wheezing or shortness of breath. 1 Inhaler 2  . alendronate (FOSAMAX) 70 MG tablet TAKE 1 TABLET BY MOUTH ONCE A WEEK 12 tablet 1  . ALPRAZolam (XANAX) 1 MG tablet TAKE 1 TABLET (1 MG TOTAL) BY MOUTH AT BEDTIME AS NEEDED FOR ANXIETY. 30 tablet 1  . amLODipine (NORVASC) 5 MG tablet TAKE 1 TABLET BY MOUTH EVERY DAY 90 tablet 1  . apixaban (ELIQUIS) 2.5 MG TABS tablet Take 1 tablet (2.5 mg total) by mouth 2 (two) times daily. (Patient taking differently: Take 2.5 mg by mouth daily. ) 60 tablet 3  . docusate sodium (COLACE) 100 MG capsule Take 200 mg by mouth 2 (two) times daily as needed for moderate constipation.     . DULoxetine (CYMBALTA) 20 MG capsule Take 2 capsules (40 mg total) by mouth daily. 60 capsule 3  . esomeprazole (NEXIUM) 40 MG capsule Take  1 capsule (40 mg total) by mouth daily. 90 capsule 1  . fluticasone (FLOVENT HFA) 220 MCG/ACT inhaler Inhale 1 puff into the lungs 2 (two) times daily. 1 Inhaler 11  . LINZESS 290 MCG CAPS capsule TAKE 1 CAPSULE BY MOUTH EVERY DAY 90 capsule 1  . lovastatin (MEVACOR) 10 MG tablet TAKE 1 TABLET BY MOUTH EVERY DAY 90 tablet 0  . NARCAN 4 MG/0.1ML LIQD nasal spray kit     . ondansetron (ZOFRAN ODT) 4 MG disintegrating tablet Take 1 tablet (4 mg total) by mouth every 8 (eight) hours as needed for nausea or vomiting. 30 tablet 0  . Oxycodone HCl 10 MG TABS Take 10 mg by mouth 3 (three) times daily.    . polyethylene glycol (MIRALAX / GLYCOLAX) packet Take 17 g by mouth daily as needed (for constipation.).     Marland Kitchen pregabalin (LYRICA) 100 MG capsule TAKE 1 CAPSULE BY MOUTH TWICE A DAY 180 capsule 1  . promethazine (PHENERGAN) 12.5 MG tablet Take 1 tablet (12.5 mg total) by mouth every 12 (twelve) hours as needed for nausea or vomiting. 20 tablet 0  . traZODone (DESYREL) 50 MG tablet Take 0.5-1.5 tablets (25-75 mg total) by mouth at bedtime as needed for sleep. 90 tablet 0  . fluticasone (FLONASE) 50 MCG/ACT nasal spray SPRAY 2 SPRAYS INTO EACH NOSTRIL EVERY DAY (Patient not taking: Reported on 08/30/2019) 48 mL 1   No current facility-administered medications on file prior to visit.    Allergies  Allergen Reactions  . Ampicillin Anaphylaxis, Nausea Only and Swelling    SEVERE HEADACHE MUSCLE CRAMPS ANGIOEDEMA THRUSH PATIENT HAS HAD A PCN REACTION WITH IMMEDIATE RASH, FACIAL/TONGUE/THROAT SWELLING, SOB, OR LIGHTHEADEDNESS WITH HYPOTENSION:  #  #  YES  #  #  Has patient had a PCN reaction causing severe rash involving mucus membranes or skin necrosis: No Has patient had a PCN reaction that required hospitalization:No Has patient had a PCN reaction occurring within the last 10 years: No.  . Ampicillin-Sulbactam Sodium Anaphylaxis and Other (See Comments)  SEVERE HEADACHE MUSCLE  CRAMPS ANGIOEDEMA THRUSH PATIENT HAS HAD A PCN REACTION WITH IMMEDIATE RASH, FACIAL/TONGUE/THROAT SWELLING, SOB, OR LIGHTHEADEDNESS WITH HYPOTENSION: # # YES # # Has patient had a PCN reaction causing severe rash involving mucus membranes or skin necrosis: No Has patient had a PCN reaction that required hospitalization:No Has patient had a PCN reaction occurring within the last 10 years: No  SEVERE HEADACHE MUSCLE CRAMPS ANGIOEDEMA THRUSH PATIENT HAS HAD A PCN REACTION WITH IMMEDIATE RASH, FACIAL/TONGUE/THROAT SWELLING, SOB, OR LIGHTHEADEDNESS WITH HYPOTENSION: # # YES # # Has patient had a PCN reaction causing severe rash involving mucus membranes or skin necrosis: No Has patient had a PCN reaction that required hospitalization:No Has patient had a PCN reaction occurring within the last 10 years: No Muscles draw up tight - severe headache angioedema  . Ambien [Zolpidem Tartrate] Nausea And Vomiting and Other (See Comments)    HALLUCINATIONS SWEATING  . Cyclobenzaprine Other (See Comments)    EXTRAPYRAMIDAL MOVEMENT INVOLUNTARY MUSCLE JERKING EXTRAPYRAMIDAL MOVEMENT INVOLUNTARY MUSCLE JERKING Gets the jerks  . Tape Itching, Rash and Other (See Comments)    Paper tape only. Adhesive tape=itching/burning/rash  . Toradol [Ketorolac Tromethamine] Nausea Only and Other (See Comments)    HEADACHE  BACKACHE  . Sulfamethoxazole-Trimethoprim     UNSPECIFIED REACTION   . Zolpidem     Hallucinations  . Cephalexin Rash and Other (See Comments)    HEADACHES  . Tramadol Rash and Other (See Comments)    HEADACHE        Observations/Objective: Today's Vitals   08/30/19 1304  PainSc: 8    There is no height or weight on file to calculate BMI.  Physical Exam  Neurological: She is alert.    CBC    Component Value Date/Time   WBC 5.9 08/28/2019 0212   RBC 4.33 08/28/2019 0212   HGB 11.8 (L) 08/28/2019 0212   HGB 14.7 12/04/2016 1525   HCT 39.2 08/28/2019 0212   HCT 44.8  12/04/2016 1525   PLT 274 08/28/2019 0212   PLT 263 12/04/2016 1525   MCV 90.5 08/28/2019 0212   MCV 92 12/04/2016 1525   MCH 27.3 08/28/2019 0212   MCHC 30.1 08/28/2019 0212   RDW 16.9 (H) 08/28/2019 0212   RDW 14.4 12/04/2016 1525   LYMPHSABS 2.0 08/28/2019 0212   LYMPHSABS 2.6 12/04/2016 1525   MONOABS 0.7 08/28/2019 0212   EOSABS 0.3 08/28/2019 0212   EOSABS 0.3 12/04/2016 1525   BASOSABS 0.1 08/28/2019 0212   BASOSABS 0.0 12/04/2016 1525    CMP     Component Value Date/Time   NA 141 08/28/2019 0212   NA 145 (H) 12/04/2016 1525   K 3.5 08/28/2019 0212   CL 101 08/28/2019 0212   CO2 25 08/28/2019 0212   GLUCOSE 116 (H) 08/28/2019 0212   BUN 10 08/28/2019 0212   BUN 7 (L) 12/04/2016 1525   CREATININE 0.97 08/28/2019 0212   CALCIUM 9.9 08/28/2019 0212   PROT 8.0 07/15/2019 2149   PROT 7.7 12/04/2016 1525   ALBUMIN 3.4 (L) 07/15/2019 2149   ALBUMIN 4.0 12/04/2016 1525   AST 28 07/15/2019 2149   ALT 20 07/15/2019 2149   ALKPHOS 121 07/15/2019 2149   BILITOT 0.3 07/15/2019 2149   BILITOT 0.2 12/04/2016 1525   GFRNONAA 56 (L) 08/28/2019 0212   GFRAA >60 08/28/2019 0212     Assessment and Plan: 1. Normocytic anemia   2. Epistaxis   3. PE (pulmonary thromboembolism) ([place])   4. History  of deep vein thrombosis of lower extremity   5. 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 She previously tolerated Eliquis 5 mg twice daily without frequent epistaxis. Discussed with patient and daughter that epistaxis can be secondary to trauma due to recent Covid swab testing and I recommend patient to establish care with ENT Dr. Tami Ribas for evaluation and see if any blood vessel can be cauterized. With her recent history of hip replacement, prior history of PE and a DVT, benefit of Eliquis 2.5 mg twice daily overweighs the risk of bleeding.  Recommend patient to resume  Eliquis 2.5 mg twice daily and continue close monitoring.  Chronic normocytic anemia, continue to monitor.  I will check iron panel, B12 and folate at the next visit.  Chronic anemia, hemoglobin at baseline. Follow Up Instructions: 4 months.   I discussed the assessment and treatment plan with the patient. The patient was provided an opportunity to ask questions and all were answered. The patient agreed with the plan and demonstrated an understanding of the instructions.  The patient was advised to call back or seek an in-person evaluation if the symptoms worsen or if the condition fails to improve as anticipated.   Earlie Server, MD 08/30/2019 4:24 PM

## 2019-08-30 NOTE — Progress Notes (Signed)
Patient verified using two identifiers for virtual visit via telephone today.  Patient had hip replacement surgery in 07/2019 and went to ER on 08/28/19 due to fall.  Daughter is concerned about her increase in nose bleeds since COVID test performed.  She also reports that patient coughed up a small about of blood yesterday.  Patient is taking Eliquis 2.5 mg QD as advised by her PCP because she would have more frequent nose bleeds on Eliquis BID.

## 2019-08-30 NOTE — Telephone Encounter (Signed)
Amlodipine was last refilled 03/29/19 for #90 with 1 refill.  Linzess was last refilled 02/26/19 for #90 with 1 refill.  Patient was last seen 07/29/19 for an acute issue - and has no upcoming appts. Debbie, are these ok to fill?

## 2019-09-03 ENCOUNTER — Emergency Department (HOSPITAL_COMMUNITY): Payer: Medicare Other

## 2019-09-03 ENCOUNTER — Encounter (HOSPITAL_COMMUNITY): Payer: Self-pay | Admitting: Emergency Medicine

## 2019-09-03 ENCOUNTER — Emergency Department (HOSPITAL_COMMUNITY)
Admission: EM | Admit: 2019-09-03 | Discharge: 2019-09-04 | Disposition: A | Discharge status: Home / Self Care | Payer: Medicare Other | Attending: Emergency Medicine | Admitting: Emergency Medicine

## 2019-09-03 ENCOUNTER — Other Ambulatory Visit: Payer: Self-pay | Admitting: Oncology

## 2019-09-03 DIAGNOSIS — Y939 Activity, unspecified: Secondary | ICD-10-CM | POA: Insufficient documentation

## 2019-09-03 DIAGNOSIS — Y929 Unspecified place or not applicable: Secondary | ICD-10-CM | POA: Insufficient documentation

## 2019-09-03 DIAGNOSIS — W0110XA Fall on same level from slipping, tripping and stumbling with subsequent striking against unspecified object, initial encounter: Secondary | ICD-10-CM | POA: Insufficient documentation

## 2019-09-03 DIAGNOSIS — Z7901 Long term (current) use of anticoagulants: Secondary | ICD-10-CM | POA: Insufficient documentation

## 2019-09-03 DIAGNOSIS — S0990XA Unspecified injury of head, initial encounter: Secondary | ICD-10-CM | POA: Diagnosis not present

## 2019-09-03 DIAGNOSIS — Y999 Unspecified external cause status: Secondary | ICD-10-CM | POA: Insufficient documentation

## 2019-09-03 MED ORDER — HYDROCODONE-ACETAMINOPHEN 5-325 MG PO TABS
1.0000 | ORAL_TABLET | Freq: Once | ORAL | Status: AC
  Administered 2019-09-03: 1 via ORAL
  Filled 2019-09-03: qty 1

## 2019-09-03 NOTE — ED Notes (Signed)
[facility] called to transport pt 

## 2019-09-03 NOTE — ED Notes (Signed)
Verbalized understanding of DC instructions and follow up care. [facility] called for transport home. Pt has informed daughter via telephone of discharge.

## 2019-09-03 NOTE — ED Provider Notes (Signed)
[facility] EMERGENCY DEPARTMENT Provider Note   CSN: [id number] Arrival date & time: 09/03/19  2209     History Chief Complaint  Patient presents with  . Fall    [name] is a 79 y.o. female.  HPI 79 year old female brought in as a level 2 trauma.  She fell from standing.  She is on Eliquis.  She states she was bending down and lost her balance and fell forward.  Struck her head twice.  Does not think she passed out but was lightheaded for a little bit.  Has a moderate headache to the top of her head.  Recently had a right hip replacement and the hip is sore but she does not feel like she injured it today.  History reviewed. No pertinent past medical history.  There are no problems to display for this patient.   History reviewed. No pertinent surgical history.   OB History   No obstetric history on file.     No family history on file.  Social History   Tobacco Use  . Smoking status: Not on file  Substance Use Topics  . Alcohol use: Not on file  . Drug use: Not on file    Home Medications Prior to Admission medications   Not on File    Allergies    Patient has no allergy information on record.  Review of Systems   Review of Systems  Musculoskeletal: Negative for arthralgias and neck pain.  Neurological: Positive for headaches. Negative for weakness and numbness.  All other systems reviewed and are negative.   Physical Exam Updated Vital Signs BP (!) 168/90 (BP Location: Left Arm)   Pulse 93   Temp 97.9 F (36.6 C) (Temporal)   Resp 19   Ht 5\' 4"  (1.626 m)   Wt 81.6 kg   SpO2 96%   BMI 30.90 kg/m   Physical Exam Vitals and nursing note reviewed.  Constitutional:      General: She is not in acute distress.    Appearance: She is well-developed. She is not ill-appearing or diaphoretic.  HENT:     Head: Normocephalic and atraumatic.     Right Ear: External ear normal.     Left Ear: External ear normal.     Nose: Nose  normal.  Eyes:     General:        Right eye: No discharge.        Left eye: No discharge.     Extraocular Movements: Extraocular movements intact.     Pupils: Pupils are equal, round, and reactive to light.  Cardiovascular:     Rate and Rhythm: Normal rate and regular rhythm.     Heart sounds: Normal heart sounds.  Pulmonary:     Effort: Pulmonary effort is normal.     Breath sounds: Normal breath sounds.  Abdominal:     General: There is no distension.     Palpations: Abdomen is soft.     Tenderness: There is no abdominal tenderness.  Musculoskeletal:     Comments: She is able to move her right hip a little though it is painful  Skin:    General: Skin is warm and dry.  Neurological:     Mental Status: She is alert and oriented to person, place, and time.     Comments: CN 3-12 grossly intact. 5/5 strength in all 4 extremities. Grossly normal sensation.   Psychiatric:        Mood and Affect: Mood  is not anxious.     ED Results / Procedures / Treatments   Labs (all labs ordered are listed, but only abnormal results are displayed) Labs Reviewed - No data to display  EKG None  Radiology CT Head Wo Contrast  Result Date: 09/03/2019 CLINICAL DATA:  Head trauma, headache, fall EXAM: CT HEAD WITHOUT CONTRAST TECHNIQUE: Contiguous axial images were obtained from the base of the skull through the vertex without intravenous contrast. COMPARISON:  08/28/2019 FINDINGS: Brain: There is atrophy and chronic small vessel disease changes. No acute intracranial abnormality. Specifically, no hemorrhage, hydrocephalus, mass lesion, acute infarction, or significant intracranial injury. Vascular: No hyperdense vessel or unexpected calcification. Skull: No acute calvarial abnormality. Sinuses/Orbits: Visualized paranasal sinuses and mastoids clear. Orbital soft tissues unremarkable. Other: None IMPRESSION: Atrophy, chronic microvascular disease. No acute intracranial abnormality. Electronically  Signed   By: Rolm Baptise M.D.   On: 09/03/2019 22:32    Procedures Procedures (including critical care time)  Medications Ordered in ED Medications  HYDROcodone-acetaminophen (NORCO/VICODIN) 5-325 MG per tablet 1 tablet (1 tablet Oral Given 09/03/19 2257)    ED Course  I have reviewed the triage vital signs and the nursing notes.  Pertinent labs & imaging results that were available during my care of the patient were reviewed by me and considered in my medical decision making (see chart for details).    MDM Rules/Calculators/A&P                      Head CT is negative.  She continues to have a headache but no neuro deficits, altered mental status, vomiting, etc.  Offered imaging of her hip but she declines and states her hip feels fine.  She does have some limited range of motion but states this is baseline.  She declines to walk states she is not normally ambulatory at home and her daughter is wheeling her around.  There was no significant trauma to her hip and there is no obvious dislocations I think injury/dislocation is highly unlikely.  She is on Eliquis so she will need to return if her symptoms worsen or do not improve but otherwise, appears stable for discharge home. Final Clinical Impression(s) / ED Diagnoses Final diagnoses:  Minor head injury, initial encounter    Rx / DC Orders ED Discharge Orders    None       Sherwood Gambler, MD 09/03/19 2315

## 2019-09-03 NOTE — ED Triage Notes (Signed)
BIB EMS from home. Pt had mechanical fall while bending over. Hit head on countertop and wall. Reports HA/dizziness. No LOC. Takes thinners. A/OX4. VSS.

## 2019-09-03 NOTE — Discharge Instructions (Signed)
If you develop new or worsening headache, vomiting, vision changes, change in mental status, or any other new/concerning symptoms and return to the ER for evaluation.

## 2019-09-04 DIAGNOSIS — S0990XA Unspecified injury of head, initial encounter: Secondary | ICD-10-CM | POA: Diagnosis not present

## 2019-09-04 NOTE — ED Notes (Signed)
Pt transported home via [facility]

## 2019-09-06 ENCOUNTER — Encounter: Payer: Self-pay | Admitting: Oncology

## 2019-09-07 ENCOUNTER — Emergency Department
Admission: EM | Admit: 2019-09-07 | Discharge: 2019-09-07 | Disposition: A | Discharge status: Home / Self Care | Payer: Medicare Other | Attending: Emergency Medicine | Admitting: Emergency Medicine

## 2019-09-07 ENCOUNTER — Emergency Department: Payer: Medicare Other

## 2019-09-07 ENCOUNTER — Other Ambulatory Visit: Payer: Self-pay

## 2019-09-07 DIAGNOSIS — Y792 Prosthetic and other implants, materials and accessory orthopedic devices associated with adverse incidents: Secondary | ICD-10-CM | POA: Diagnosis not present

## 2019-09-07 DIAGNOSIS — Y9389 Activity, other specified: Secondary | ICD-10-CM | POA: Insufficient documentation

## 2019-09-07 DIAGNOSIS — X500XXA Overexertion from strenuous movement or load, initial encounter: Secondary | ICD-10-CM | POA: Insufficient documentation

## 2019-09-07 DIAGNOSIS — Z79899 Other long term (current) drug therapy: Secondary | ICD-10-CM | POA: Diagnosis not present

## 2019-09-07 DIAGNOSIS — Y929 Unspecified place or not applicable: Secondary | ICD-10-CM | POA: Insufficient documentation

## 2019-09-07 DIAGNOSIS — I129 Hypertensive chronic kidney disease with stage 1 through stage 4 chronic kidney disease, or unspecified chronic kidney disease: Secondary | ICD-10-CM | POA: Insufficient documentation

## 2019-09-07 DIAGNOSIS — N183 Chronic kidney disease, stage 3 unspecified: Secondary | ICD-10-CM | POA: Insufficient documentation

## 2019-09-07 DIAGNOSIS — T84020A Dislocation of internal right hip prosthesis, initial encounter: Secondary | ICD-10-CM | POA: Insufficient documentation

## 2019-09-07 DIAGNOSIS — Z96651 Presence of right artificial knee joint: Secondary | ICD-10-CM | POA: Insufficient documentation

## 2019-09-07 DIAGNOSIS — J449 Chronic obstructive pulmonary disease, unspecified: Secondary | ICD-10-CM | POA: Insufficient documentation

## 2019-09-07 DIAGNOSIS — S73004A Unspecified dislocation of right hip, initial encounter: Secondary | ICD-10-CM

## 2019-09-07 DIAGNOSIS — Y999 Unspecified external cause status: Secondary | ICD-10-CM | POA: Diagnosis not present

## 2019-09-07 LAB — BASIC METABOLIC PANEL
Anion gap: 9 (ref 5–15)
BUN: 9 mg/dL (ref 8–23)
CO2: 28 mmol/L (ref 22–32)
Calcium: 9.4 mg/dL (ref 8.9–10.3)
Chloride: 105 mmol/L (ref 98–111)
Creatinine, Ser: 0.91 mg/dL (ref 0.44–1.00)
GFR calc Af Amer: >60 mL/min (ref >60)
GFR calc non Af Amer: >60 mL/min (ref >60)
Glucose, Bld: 98 mg/dL (ref 70–99)
Potassium: 3.5 mmol/L (ref 3.5–5.1)
Sodium: 142 mmol/L (ref 135–145)

## 2019-09-07 LAB — CBC WITH DIFFERENTIAL/PLATELET
Abs Immature Granulocytes: 0.01 10*3/uL (ref 0.00–0.07)
Basophils Absolute: 0.1 10*3/uL (ref 0.0–0.1)
Basophils Relative: 1 %
Eosinophils Absolute: 0.3 10*3/uL (ref 0.0–0.5)
Eosinophils Relative: 4 %
HCT: 37.9 % (ref 36.0–46.0)
Hemoglobin: 11.4 g/dL — ABNORMAL LOW (ref 12.0–15.0)
Immature Granulocytes: 0 %
Lymphocytes Relative: 23 %
Lymphs Abs: 1.7 10*3/uL (ref 0.7–4.0)
MCH: 27 pg (ref 26.0–34.0)
MCHC: 30.1 g/dL (ref 30.0–36.0)
MCV: 89.6 fL (ref 80.0–100.0)
Monocytes Absolute: 0.8 10*3/uL (ref 0.1–1.0)
Monocytes Relative: 11 %
Neutro Abs: 4.5 10*3/uL (ref 1.7–7.7)
Neutrophils Relative %: 61 %
Platelets: 282 10*3/uL (ref 150–400)
RBC: 4.23 MIL/uL (ref 3.87–5.11)
RDW: 16.1 % — ABNORMAL HIGH (ref 11.5–15.5)
WBC: 7.4 10*3/uL (ref 4.0–10.5)
nRBC: 0 % (ref 0.0–0.2)

## 2019-09-07 MED ORDER — PROPOFOL 10 MG/ML IV BOLUS
0.5000 mg/kg | Freq: Once | INTRAVENOUS | Status: AC
  Administered 2019-09-07: 40.8 mg via INTRAVENOUS
  Filled 2019-09-07: qty 20

## 2019-09-07 MED ORDER — FENTANYL CITRATE (PF) 100 MCG/2ML IJ SOLN
100.0000 ug | Freq: Once | INTRAMUSCULAR | Status: AC
  Administered 2019-09-07: 100 ug via INTRAVENOUS
  Filled 2019-09-07: qty 2

## 2019-09-07 MED ORDER — OXYCODONE HCL 5 MG PO TABS
10.0000 mg | ORAL_TABLET | Freq: Once | ORAL | Status: AC
  Administered 2019-09-07: 10 mg via ORAL
  Filled 2019-09-07: qty 2

## 2019-09-07 MED ORDER — OXYCODONE HCL 5 MG PO TABS
5.0000 mg | ORAL_TABLET | Freq: Once | ORAL | Status: AC
  Administered 2019-09-07: 5 mg via ORAL
  Filled 2019-09-07: qty 1

## 2019-09-07 NOTE — ED Triage Notes (Signed)
Pt to ED via EMS from home. Pt arrives c/o right hip pain, pt was getting dressed tonight and dislocated newly operated on right hip. Pt had operation 4/3, had previous dislocation 4/8. Pt has rotation noted to foot

## 2019-09-07 NOTE — ED Provider Notes (Signed)
[facility] Emergency Department Provider Note  ____________________________________________   I have reviewed the triage vital signs and the nursing notes.   HISTORY  Chief Complaint Hip Pain   History limited by: Not Limited   HPI [name] is a 79 y.o. female who presents to the emergency department today because of concern for right hip pain. Patient has history of right hip dislocations. States she was trying to put on underwear when she had sudden onset of pain to the right hip. She was given pain medication by EMS without significant relief.    Records reviewed. Per medical record review patient has a history of right hip dislocation 10 days ago. Seen at [facility].   Past Medical History:  Diagnosis Date  . Acute postoperative pain 02/03/2018  . Anemia   . B12 deficiency   . Bacteremia 10/07/2014  . Bladder incontinence   . Blind left eye   . Cancer ([place])    skin cancer   . Cataract   . Cervical spine fracture ([place])   . Chest pain 07/29/2013  . Compression fracture    C7,  upper T spine -compression fx  . COPD (chronic obstructive pulmonary disease) ([place])   . DDD (degenerative disc disease), lumbar   . Depression    major  . Diffuse myofascial pain syndrome 03/24/2015  . Dyspnea    at times- when activty  . Fever 10/05/2014  . GERD (gastroesophageal reflux disease)   . Hiatal hernia   . Hyperlipidemia   . Hypertension   . 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 Macular degeneration   . On home oxygen therapy    "3L; all the time" (04/30/2018)  . Osteoarthritis   . Osteoporosis   . Pneumonia    years ago  . Pulmonary embolism ([place]) 04/30/2018  . Reactive airway disease   . Rupture of bowel ([place])   . Sepsis ([place]) 10/08/2014  . Stroke [facility])    "mini stroke"- balance and memory issue  . Stroke ([place])   . Wrist pain, acute 09/10/2012    Patient Active Problem List   Diagnosis Date Noted  . Acute cystitis  07/29/2019  . Non-intractable vomiting 05/19/2019  . Anemia 05/04/2019  . History of deep vein thrombosis of lower extremity 01/05/2019  . GI bleed 10/29/2018  . ILD (interstitial lung disease) ([place]) 08/12/2018  . COPD exacerbation ([place]) 05/17/2018  . Pressure injury of skin 05/04/2018  . PE (pulmonary thromboembolism) ([place]) 04/30/2018  . Acute metabolic encephalopathy   . Respiratory failure with hypoxia ([place]) 04/20/2018  . CAP (community acquired pneumonia) 04/20/2018  . Acute lower UTI 04/20/2018  . Chronic lower extremity pain (Right) 04/07/2018  . Abnormal MRI, lumbar spine 04/07/2018  . Lumbar facet hypertrophy (Multilevel) (Bilateral) 04/07/2018  . Lumbar foraminal stenosis (L3-4, L4-5) (Bilateral) 04/07/2018  . Annular tear of lumbar disc (L3-4, L4-5) 04/07/2018  . Closed hip fracture, sequela (Right) 02/03/2018  . It band syndrome, right 12/18/2017  . History of hip replacement (Right) 12/18/2017  . Ileus ([place]) 10/27/2017  . Primary osteoarthritis of hip 09/30/2017  . History of small bowel obstruction 09/10/2017  . 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 SBO (small bowel obstruction) ([place]) 08/24/2017  . Abnormal x-ray of pelvis 08/13/2017  . Other specified dorsopathies, sacral and sacrococcygeal region 08/04/2017  . Spondylosis without myelopathy or radiculopathy, lumbosacral region 07/17/2017  . Non-traumatic compression fracture of T3 thoracic vertebra, sequela 07/02/2017  . High risk  medication use 07/02/2017  . At high risk for falls 07/02/2017  . Chronic sacroiliac joint pain (Right) 07/02/2017  . CKD (chronic kidney disease) stage 3, GFR 30-59 ml/min 04/23/2017  . Chronic knee pain s/p total knee replacement (TKR) (Right) 04/02/2017  . DDD (degenerative disc disease), lumbar 03/05/2017  . Chronic knee pain (Bilateral) (R>L) 03/05/2017  . Osteoarthritis 03/05/2017  . Chronic musculoskeletal pain 03/05/2017  . Neurogenic pain 03/05/2017  .  Chronic myofascial pain 02/12/2017  . Lumbar facet syndrome (Bilateral) (L>R) 02/12/2017  . Long term prescription benzodiazepine use 02/12/2017  . Disorder of skeletal system 02/12/2017  . Pharmacologic therapy 02/12/2017  . Problems influencing health status 02/12/2017  . 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 NSAID long-term use 02/12/2017  . DDD (degenerative disc disease), cervical 11/11/2016  . Lumbar facet arthropathy (Bilateral) 11/11/2016  . 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 CAFL (chronic airflow limitation) ([place]) 11/07/2016  . Depression, major, in partial remission ([place]) 11/07/2016  . 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 Long term current use of opiate analgesic 11/07/2016  . Long term prescription opiate use 11/07/2016  . Chronic pain syndrome 11/07/2016  . Chronic neck pain (Secondary Area of Pain) (Bilateral) (L>R) 11/07/2016  . Chronic hip pain (Right) 11/07/2016  . Age-related osteoporosis with current pathological fracture with routine healing 09/20/2016  . History of stroke 09/20/2016  . Intertrochanteric fracture of femur, sequela (Right) 09/13/2016  . Chronic shoulder pain (Bilateral) 08/12/2016  . Hx of total knee replacement (Bilateral) 04/04/2016  . Chronic shoulder pain (Left) 02/28/2016  . Hoarseness 01/12/2016  . Pharyngoesophageal dysphagia 01/12/2016  . Chronic low back pain [facility] Area of Pain) (Bilateral) (L>R) 10/26/2015  . 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 Syncope, non cardiac 05/22/2015  . Thrombocytopenia ([place]) 10/07/2014  . Hypoxia 10/05/2014  . Anxiety 06/15/2014  . Duodenogastric reflux 06/15/2014  . Benign essential HTN 06/15/2014  . Hypercholesteremia 07/30/2013  . HTN (hypertension) 07/30/2013  . GERD (gastroesophageal reflux disease) 07/30/2013  . Memory loss 05/03/2013  . Chronic knee pain (Primary Area of Pain) (Right)  09/10/2012    Past Surgical History:  Procedure Laterality Date  . ABDOMINAL HYSTERECTOMY    . ABDOMINAL SURGERY    . APPENDECTOMY    . BLADDER SURGERY    . CATARACT EXTRACTION W/ INTRAOCULAR LENS  IMPLANT, BILATERAL Bilateral   . CHOLECYSTECTOMY    . COLON SURGERY    . COLOSTOMY REVERSAL    . ESOPHAGOGASTRODUODENOSCOPY N/A 10/30/2018   Procedure: ESOPHAGOGASTRODUODENOSCOPY (EGD);  Surgeon: Lin Landsman, MD;  [facility] ENDOSCOPY;  Service: Gastroenterology;  Laterality: N/A;  . INTRAMEDULLARY (IM) NAIL INTERTROCHANTERIC Right 09/13/2016   Procedure: INTRAMEDULLARY (IM) NAIL INTERTROCHANTRIC RIGHT;  Surgeon: Leandrew Koyanagi, MD;  Location: [place];  Service: Orthopedics;  Laterality: Right;  . JOINT REPLACEMENT Bilateral   . JOINT REPLACEMENT    . KNEE SURGERY    . OSTOMY    . RECTOCELE REPAIR    . TOE SURGERY Right    3rd  . TOTAL HIP ARTHROPLASTY Right 09/30/2017   Procedure: RIGHT HIP IM NAIL REMOVAL WITH CONVERSION TO TOTAL HIP ARTHOPLASTY, anterior approach;  Surgeon: Renette Butters, MD;  Location: [place];  Service: Orthopedics;  Laterality: Right;    Prior to Admission medications   Medication Sig Start Date End Date Taking? Authorizing Provider  acetaminophen (TYLENOL) 500 MG tablet Take 2 tablets (1,000 mg total) by mouth 3 (three) times  daily. Patient taking differently: Take 1,000 mg by mouth 2 (two) times daily.  08/25/18   Ria Bush, MD  albuterol (ACCUNEB) 1.25 MG/3ML nebulizer solution Take 3 mLs (1.25 mg total) by nebulization every 6 (six) hours as needed for wheezing. 08/26/18   [name], FNP  albuterol (VENTOLIN HFA) 108 (90 Base) MCG/ACT inhaler Inhale 2 puffs into the lungs every 6 (six) hours as needed for wheezing or shortness of breath. 09/25/18   [name], FNP  alendronate (FOSAMAX) 70 MG tablet TAKE 1 TABLET BY MOUTH ONCE A WEEK 06/09/19   [name], FNP  ALPRAZolam Duanne Moron) 1 MG tablet TAKE 1 TABLET (1 MG TOTAL) BY MOUTH AT  BEDTIME AS NEEDED FOR ANXIETY. 08/15/19   [name], FNP  amLODipine (NORVASC) 5 MG tablet TAKE 1 TABLET BY MOUTH EVERY DAY 08/30/19   [name], FNP  docusate sodium (COLACE) 100 MG capsule Take 200 mg by mouth 2 (two) times daily as needed for moderate constipation.     [provider]  DULoxetine (CYMBALTA) 20 MG capsule Take 2 capsules (40 mg total) by mouth daily. 04/30/19   [name], FNP  ELIQUIS 2.5 MG TABS tablet TAKE 1 TABLET BY MOUTH TWICE A DAY 09/04/19   Earlie Server, MD  esomeprazole (NEXIUM) 40 MG capsule Take 1 capsule (40 mg total) by mouth daily. 03/24/19   [name], FNP  fluticasone (FLONASE) 50 MCG/ACT nasal spray SPRAY 2 SPRAYS INTO EACH NOSTRIL EVERY DAY Patient not taking: Reported on 08/30/2019 08/25/19   [name], FNP  fluticasone (FLOVENT HFA) 220 MCG/ACT inhaler Inhale 1 puff into the lungs 2 (two) times daily. 09/25/18   [name], FNP  LINZESS 290 MCG CAPS capsule TAKE 1 CAPSULE BY MOUTH EVERY DAY 08/30/19   [name], FNP  lovastatin (MEVACOR) 10 MG tablet TAKE 1 TABLET BY MOUTH EVERY DAY 06/23/19   [name], FNP  NARCAN 4 MG/0.1ML LIQD nasal spray kit  09/10/18   [provider]  ondansetron (ZOFRAN ODT) 4 MG disintegrating tablet Take 1 tablet (4 mg total) by mouth every 8 (eight) hours as needed for nausea or vomiting. 06/12/18   [name], FNP  Oxycodone HCl 10 MG TABS Take 10 mg by mouth 3 (three) times daily. 09/23/18   [provider]  polyethylene glycol (MIRALAX / GLYCOLAX) packet Take 17 g by mouth daily as needed (for constipation.).     [provider]  pregabalin (LYRICA) 100 MG capsule TAKE 1 CAPSULE BY MOUTH TWICE A DAY 06/11/19   [name], FNP  promethazine (PHENERGAN) 12.5 MG tablet Take 1 tablet (12.5 mg total) by mouth every 12 (twelve) hours as needed for nausea or vomiting. 07/21/19   [name], FNP  traZODone (DESYREL) 50 MG tablet  Take 0.5-1.5 tablets (25-75 mg total) by mouth at bedtime as needed for sleep. 06/25/19   [name], FNP    Allergies Ampicillin, Ampicillin-sulbactam sodium, Ambien [zolpidem tartrate], Cyclobenzaprine, Tape, Toradol [ketorolac tromethamine], Sulfamethoxazole-trimethoprim, Zolpidem, Cephalexin, and Tramadol  Family History  Problem Relation Age of Onset  . Heart failure Mother   . Heart attack Father   . Lung cancer Sister   . Deep vein thrombosis Sister   . Pulmonary embolism Son   . Deep vein thrombosis Son   . Cervical cancer Daughter     Social History Social History   Tobacco Use  . Smoking status: Never Smoker  .  Smokeless tobacco: Never Used  Substance Use Topics  . Alcohol use: Not Currently  . Drug use: Not Currently    Review of Systems Constitutional: No fever/chills Eyes: No visual changes. ENT: No sore throat. Cardiovascular: Denies chest pain. Respiratory: Denies shortness of breath. Gastrointestinal: No abdominal pain.  No nausea, no vomiting.  No diarrhea.   Genitourinary: Negative for dysuria. Musculoskeletal: Positive for right hip pain. Skin: Negative for rash. Neurological: Negative for headaches, focal weakness or numbness.  ____________________________________________   PHYSICAL EXAM:  VITAL SIGNS: ED Triage Vitals [09/07/19 0420]  Enc Vitals Group     BP      Pulse      Resp      Temp      Temp src      SpO2      Weight 179 lb 14.3 oz (81.6 kg)     Height 5' 4" (1.626 m)     Head Circumference      Peak Flow      Pain Score 10    Constitutional: Alert and oriented.  Eyes: Conjunctivae are normal.  ENT      Head: Normocephalic and atraumatic.      Nose: No congestion/rhinnorhea.      Mouth/Throat: Mucous membranes are moist.      Neck: No stridor. Hematological/Lymphatic/Immunilogical: No cervical lymphadenopathy. Cardiovascular: Normal rate, regular rhythm.  No murmurs, rubs, or gallops.  Respiratory: Normal  respiratory effort without tachypnea nor retractions. Breath sounds are clear and equal bilaterally. No wheezes/rales/rhonchi. Gastrointestinal: Soft and non tender. No rebound. No guarding.  Genitourinary: Deferred Musculoskeletal: Right hip tender to manipulation, right leg internally rotated. DP 2+ Neurologic:  Normal speech and language. No gross focal neurologic deficits are appreciated.  Skin:  Skin is warm, dry and intact. No rash noted. Psychiatric: Mood and affect are normal. Speech and behavior are normal. Patient exhibits appropriate insight and judgment.  ____________________________________________    LABS (pertinent positives/negatives)  BMP wnl CBC wbc 7.4, hgb 11.4, plt 282  ____________________________________________   EKG  I, Nance Pear, attending physician, personally viewed and interpreted this EKG  EKG Time: 0509 Rate: 87 Rhythm: sinus rhythm Axis: normal Intervals: qtc 447 QRS: narrow, q waves III, aVF, V1 ST changes: no st elevation Impression: abnormal ekg  ____________________________________________    RADIOLOGY  Right hip Dislocation of prosthetic right hip  Right hip Interval reduction  I, Nance Pear, personally viewed and evaluated these images (plain radiographs) as part of my medical decision making, as well as reviewing the written report by the radiologist.    ____________________________________________   PROCEDURES  .Sedation  Date/Time: 09/07/2019 6:55 AM Performed by: Nance Pear, MD Authorized by: Nance Pear, MD   Consent:    Consent obtained:  Written (electronic informed consent)   Consent given by:  Patient   Risks discussed:  Allergic reaction, dysrhythmia, inadequate sedation, nausea, vomiting, respiratory compromise necessitating ventilatory assistance and intubation, prolonged sedation necessitating reversal and prolonged hypoxia resulting in organ damage Universal protocol:    Procedure  explained and questions answered to patient or proxy's satisfaction: yes     Relevant documents present and verified: yes     Test results available and properly labeled: yes     Imaging studies available: yes     Required blood products, implants, devices, and special equipment available: yes     Immediately prior to procedure a time out was called: yes     Patient identity confirmation method:  Arm band Indications:  Procedure performed:  Dislocation reduction   Procedure necessitating sedation performed by:  Physician performing sedation Pre-sedation assessment:    Time since last food or drink:  Unknown   NPO status caution: urgency dictates proceeding with non-ideal NPO status     ASA classification: class 2 - patient with mild systemic disease     Mallampati score:  II - soft palate, uvula, fauces visible   Pre-sedation assessments completed and reviewed: airway patency, cardiovascular function, hydration status, mental status, nausea/vomiting, pain level, respiratory function and temperature   Immediate pre-procedure details:    Reassessment: Patient reassessed immediately prior to procedure     Reviewed: vital signs, relevant labs/tests and NPO status     Verified: bag valve mask available, emergency equipment available, intubation equipment available, IV patency confirmed, oxygen available, reversal medications available and suction available   Procedure details (see MAR for exact dosages):    Preoxygenation:  Room air   Sedation:  Propofol   Intended level of sedation: deep   Analgesia:  Fentanyl   Intra-procedure monitoring:  Blood pressure monitoring, continuous pulse oximetry, cardiac monitor, frequent vital sign checks and frequent LOC assessments   Intra-procedure events: none     Total Provider sedation time (minutes):  15 Post-procedure details:    Attendance: Constant attendance by certified staff until patient recovered     Recovery: Patient returned to  pre-procedure baseline     Post-sedation assessments completed and reviewed: airway patency, cardiovascular function, hydration status, mental status and respiratory function     Patient is stable for discharge or admission: yes     Patient tolerance:  Tolerated well, no immediate complications  Reduction of dislocation Performed by: Nance Pear Authorized by: Nance Pear Consent: Written consent obtained. Risks and benefits: risks, benefits and alternatives were discussed Consent given by: patient Required items: required blood products, implants, devices, and special equipment available Time out: Immediately prior to procedure a "time out" was called to verify the correct patient, procedure, equipment, support staff and site/side marked as required.  Patient sedated: Yes  Vitals: Vital signs were monitored during sedation. Patient tolerance: Patient tolerated the procedure well with no immediate complications. Joint: right hip Reduction technique: traction, counter traction    ____________________________________________   INITIAL IMPRESSION / ASSESSMENT AND PLAN / ED COURSE  Pertinent labs & imaging results that were available during my care of the patient were reviewed by me and considered in my medical decision making (see chart for details).   Patient presented to the emergency department today because of concerns for right hip pain.  Patient has a history of dislocation of that right hip and total right hip replacement.  Initial physical exam was consistent with hip dislocation.  X-rays confirm this.  Patient was sedated and right hip was successfully reduced.  Will discharge home.  Per PDMP patient should still have narcotics at home for pain control. Discussed with patient importance of follow up with her orthopedic surgeon.  ____________________________________________   FINAL CLINICAL IMPRESSION(S) / ED DIAGNOSES  Final diagnoses:  Dislocation of right hip,  initial encounter [facility])     Note: This dictation was prepared with Dragon dictation. Any transcriptional errors that result from this process are unintentional     Nance Pear, MD 09/07/19 0700

## 2019-09-07 NOTE — ED Notes (Signed)
Pt given water, waiting for daughter to come, pt reports her cell phone battery died.

## 2019-09-07 NOTE — Sedation Documentation (Signed)
Pt transported to xray 

## 2019-09-07 NOTE — Discharge Instructions (Addendum)
Please follow up with your orthopedic surgeon.

## 2019-09-07 NOTE — ED Notes (Addendum)
Pt denies any pain at this time states "I don't feel a thing", pt states she has to go to the bathroom, however, at this time, the patient has a purewick placed and is actively draining urine into canister. Advised patient of this, patient checked to see if she was wet, pt dry at this time.   Pt states her daughter is coming to get her, pt states she is worried about being in pain at home, Advised her that she will be given a prescription for pain medication at discharge.  Pt also requesting additional water, pt given cup of ice water, offered crackers as well, but declined.  Pt shown where call light is as well.

## 2019-09-07 NOTE — ED Notes (Signed)
Pt's daughter here to pick up patient, upset that the patient wants something for pain, I asked Dr. Ellender Hose for pain med prior to discharge.  Discussed importance for follow up with ortho surgeon at [place].  Pt was assisted to wheelchair by this RN and Presenter, broadcasting and escorted to car.

## 2019-09-07 NOTE — ED Notes (Signed)
pts daughter coming to get pt

## 2019-09-07 NOTE — ED Notes (Signed)
purwick placed on pt

## 2019-09-11 ENCOUNTER — Ambulatory Visit: Payer: Medicare Other | Attending: Internal Medicine

## 2019-09-11 ENCOUNTER — Ambulatory Visit: Payer: Medicare Other

## 2019-09-11 DIAGNOSIS — Z23 Encounter for immunization: Secondary | ICD-10-CM

## 2019-09-11 NOTE — Progress Notes (Signed)
   Covid-19 Vaccination Clinic  Name:  RALPHINE HINKS    MRN: [id number] DOB: [birth year]  09/11/2019  Ms. Girdner was observed post Covid-19 immunization for 15 minutes without incident. She was provided with Vaccine Information Sheet and instruction to access the V-Safe system.   Ms. Robart was instructed to call 911 with any severe reactions post vaccine: Marland Kitchen Difficulty breathing  . Swelling of face and throat  . A fast heartbeat  . A bad rash all over body  . Dizziness and weakness   Immunizations Administered    Name Date Dose VIS Date Route   Pfizer COVID-19 Vaccine 09/11/2019  1:30 PM 0.3 mL 05/07/2019 Intramuscular   Manufacturer: [place]   Lot: [id number]   [place]: [id number]

## 2019-09-14 ENCOUNTER — Other Ambulatory Visit: Payer: Self-pay | Admitting: Family Medicine

## 2019-09-15 NOTE — Telephone Encounter (Signed)
Last apt 05/19/19 Next apt , unknown Last fill 03/24/19, # 99, 1 RF Sent in script

## 2019-09-16 ENCOUNTER — Ambulatory Visit (INDEPENDENT_AMBULATORY_CARE_PROVIDER_SITE_OTHER): Payer: Medicare Other

## 2019-09-16 VITALS — BP 134/71 | Wt 168.0 lb

## 2019-09-16 DIAGNOSIS — Z0000 Encounter for general adult medical examination without abnormal findings: Secondary | ICD-10-CM | POA: Diagnosis not present

## 2019-09-16 NOTE — Progress Notes (Signed)
PCP notes:  Health Maintenance: Colonscopy- declined Mammogram- declined Tdap- insurance/financial   Abnormal Screenings: none   Patient concerns: Patient wants her Xanax increased to 1/2 tablet in the am and 1 tablet po qhs.    Nurse concerns: none   Next PCP appt.: none

## 2019-09-16 NOTE — Patient Instructions (Signed)
[name] , Thank you for taking time to come for your Medicare Wellness Visit. I appreciate your ongoing commitment to your health goals. Please review the following plan we discussed and let me know if I can assist you in the future.   Screening recommendations/referrals: Colonoscopy: declined Mammogram: declined Bone Density: completed 03/08/2015 Recommended yearly ophthalmology/optometry visit for glaucoma screening and checkup Recommended yearly dental visit for hygiene and checkup  Vaccinations: Influenza vaccine: Up to date, completed 04/02/2019 Pneumococcal vaccine: Completed series Tdap vaccine: declined Shingles vaccine: administered 04/02/2019    Advanced directives: Advance directive discussed with you today. Even though you declined this today please call our office should you change your mind and we can give you the proper paperwork for you to fill out.  Conditions/risks identified: hypertension  Next appointment: none   Preventive Care 78 Years and Older, Female Preventive care refers to lifestyle choices and visits with your health care provider that can promote health and wellness. What does preventive care include?  A yearly physical exam. This is also called an annual well check.  Dental exams once or twice a year.  Routine eye exams. Ask your health care provider how often you should have your eyes checked.  Personal lifestyle choices, including:  Daily care of your teeth and gums.  Regular physical activity.  Eating a healthy diet.  Avoiding tobacco and drug use.  Limiting alcohol use.  Practicing safe sex.  Taking low-dose aspirin every day.  Taking vitamin and mineral supplements as recommended by your health care provider. What happens during an annual well check? The services and screenings done by your health care provider during your annual well check will depend on your age, overall health, lifestyle risk factors, and family history of  disease. Counseling  Your health care provider may ask you questions about your:  Alcohol use.  Tobacco use.  Drug use.  Emotional well-being.  Home and relationship well-being.  Sexual activity.  Eating habits.  History of falls.  Memory and ability to understand (cognition).  Work and work Statistician.  Reproductive health. Screening  You may have the following tests or measurements:  Height, weight, and BMI.  Blood pressure.  Lipid and cholesterol levels. These may be checked every 5 years, or more frequently if you are over 78 years old.  Skin check.  Lung cancer screening. You may have this screening every year starting at age 15 if you have a 30-pack-year history of smoking and currently smoke or have quit within the past 15 years.  Fecal occult blood test (FOBT) of the stool. You may have this test every year starting at age 78.  Flexible sigmoidoscopy or colonoscopy. You may have a sigmoidoscopy every 5 years or a colonoscopy every 10 years starting at age 78.  Hepatitis C blood test.  Hepatitis B blood test.  Sexually transmitted disease (STD) testing.  Diabetes screening. This is done by checking your blood sugar (glucose) after you have not eaten for a while (fasting). You may have this done every 1-3 years.  Bone density scan. This is done to screen for osteoporosis. You may have this done starting at age 78.  Mammogram. This may be done every 1-2 years. Talk to your health care provider about how often you should have regular mammograms. Talk with your health care provider about your test results, treatment options, and if necessary, the need for more tests. Vaccines  Your health care provider may recommend certain vaccines, such as:  Influenza vaccine.  This is recommended every year.  Tetanus, diphtheria, and acellular pertussis (Tdap, Td) vaccine. You may need a Td booster every 10 years.  Zoster vaccine. You may need this after age  78.  Pneumococcal 13-valent conjugate (PCV13) vaccine. One dose is recommended after age 78.  Pneumococcal polysaccharide (PPSV23) vaccine. One dose is recommended after age 78. Talk to your health care provider about which screenings and vaccines you need and how often you need them. This information is not intended to replace advice given to you by your health care provider. Make sure you discuss any questions you have with your health care provider. Document Released: 06/09/2015 Document Revised: 01/31/2016 Document Reviewed: 03/14/2015 Elsevier Interactive Patient Education  2017 [place] Prevention in the Home Falls can cause injuries. They can happen to people of all ages. There are many things you can do to make your home safe and to help prevent falls. What can I do on the outside of my home?  Regularly fix the edges of walkways and driveways and fix any cracks.  Remove anything that might make you trip as you walk through a door, such as a raised step or threshold.  Trim any bushes or trees on the path to your home.  Use bright outdoor lighting.  Clear any walking paths of anything that might make someone trip, such as rocks or tools.  Regularly check to see if handrails are loose or broken. Make sure that both sides of any steps have handrails.  Any raised decks and porches should have guardrails on the edges.  Have any leaves, snow, or ice cleared regularly.  Use sand or salt on walking paths during winter.  Clean up any spills in your garage right away. This includes oil or grease spills. What can I do in the bathroom?  Use night lights.  Install grab bars by the toilet and in the tub and shower. Do not use towel bars as grab bars.  Use non-skid mats or decals in the tub or shower.  If you need to sit down in the shower, use a plastic, non-slip stool.  Keep the floor dry. Clean up any water that spills on the floor as soon as it happens.  Remove  soap buildup in the tub or shower regularly.  Attach bath mats securely with double-sided non-slip rug tape.  Do not have throw rugs and other things on the floor that can make you trip. What can I do in the bedroom?  Use night lights.  Make sure that you have a light by your bed that is easy to reach.  Do not use any sheets or blankets that are too big for your bed. They should not hang down onto the floor.  Have a firm chair that has side arms. You can use this for support while you get dressed.  Do not have throw rugs and other things on the floor that can make you trip. What can I do in the kitchen?  Clean up any spills right away.  Avoid walking on wet floors.  Keep items that you use a lot in easy-to-reach places.  If you need to reach something above you, use a strong step stool that has a grab bar.  Keep electrical cords out of the way.  Do not use floor polish or wax that makes floors slippery. If you must use wax, use non-skid floor wax.  Do not have throw rugs and other things on the floor that can make  you trip. What can I do with my stairs?  Do not leave any items on the stairs.  Make sure that there are handrails on both sides of the stairs and use them. Fix handrails that are broken or loose. Make sure that handrails are as long as the stairways.  Check any carpeting to make sure that it is firmly attached to the stairs. Fix any carpet that is loose or worn.  Avoid having throw rugs at the top or bottom of the stairs. If you do have throw rugs, attach them to the floor with carpet tape.  Make sure that you have a light switch at the top of the stairs and the bottom of the stairs. If you do not have them, ask someone to add them for you. What else can I do to help prevent falls?  Wear shoes that:  Do not have high heels.  Have rubber bottoms.  Are comfortable and fit you well.  Are closed at the toe. Do not wear sandals.  If you use a  stepladder:  Make sure that it is fully opened. Do not climb a closed stepladder.  Make sure that both sides of the stepladder are locked into place.  Ask someone to hold it for you, if possible.  Clearly mark and make sure that you can see:  Any grab bars or handrails.  First and last steps.  Where the edge of each step is.  Use tools that help you move around (mobility aids) if they are needed. These include:  Canes.  Walkers.  Scooters.  Crutches.  Turn on the lights when you go into a dark area. Replace any light bulbs as soon as they burn out.  Set up your furniture so you have a clear path. Avoid moving your furniture around.  If any of your floors are uneven, fix them.  If there are any pets around you, be aware of where they are.  Review your medicines with your doctor. Some medicines can make you feel dizzy. This can increase your chance of falling. Ask your doctor what other things that you can do to help prevent falls. This information is not intended to replace advice given to you by your health care provider. Make sure you discuss any questions you have with your health care provider. Document Released: 03/09/2009 Document Revised: 10/19/2015 Document Reviewed: 06/17/2014 Elsevier Interactive Patient Education  2017 [employer].

## 2019-09-16 NOTE — Progress Notes (Signed)
Subjective:   MIKO MARKWOOD is a 79 y.o. female who presents for Medicare Annual (Subsequent) preventive examination.  Review of Systems: N/A   This visit is being conducted through telemedicine via telephone at the nurse health advisor's home address due to the COVID-19 pandemic. This patient has given me verbal consent via doximity to conduct this visit, patient states they are participating from their home address. Patient and myself are on the telephone call. There is no referral for this visit. Some vital signs may be absent or patient reported.    Patient identification: identified by name, DOB, and current address   Cardiac Risk Factors include: advanced age (>41mn, >>26women);hypertension     Objective:     Vitals: BP 134/71   Wt 168 lb (76.2 kg)   BMI 28.84 kg/m   Body mass index is 28.84 kg/m.  Advanced Directives 09/16/2019 09/07/2019 08/28/2019 08/28/2019 07/15/2019 06/18/2019 05/04/2019  Does Patient Have a Medical Advance Directive? No No No No No No Yes;No  Type of Advance Directive - - - - - - -  Does patient want to make changes to medical advance directive? - - - - - - -  Copy of HV[place]ain Chart? - - - - - - -  Would patient like information on creating a medical advance directive? No - Patient declined No - Patient declined No - Patient declined No - Patient declined - - -    Tobacco Social History   Tobacco Use  Smoking Status Never Smoker  Smokeless Tobacco Never Used     Counseling given: Not Answered   Clinical Intake:  Pre-visit preparation completed: Yes  Pain : No/denies pain Pain Score: 0-No pain     Nutritional Risks: Nausea/ vomitting/ diarrhea(nausea off and on) Diabetes: No  How often do you need to have someone help you when you read instructions, pamphlets, or other written materials from your doctor or pharmacy?: 1 - Never What is the last grade level you completed in school?: 4th  Interpreter Needed?:  No  Information entered by :: cJohnson, LPN  Past Medical History:  Diagnosis Date  . Acute postoperative pain 02/03/2018  . Anemia   . B12 deficiency   . Bacteremia 10/07/2014  . Bladder incontinence   . Blind left eye   . [place]    skin cancer   . Cataract   . Cervical spine fracture ([facility]   . Chest pain 07/29/2013  . Compression fracture    C7,  upper T spine -compression fx  . COPD (chronic obstructive pulmonary disease) ([facility]   . DDD (degenerative disc disease), lumbar   . Depression    major  . Diffuse myofascial pain syndrome 03/24/2015  . Dyspnea    at times- when activty  . Fever 10/05/2014  . GERD (gastroesophageal reflux disease)   . Hiatal hernia   . Hyperlipidemia   . Hypertension   . 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 Macular degeneration   . On home oxygen therapy    "3L; all the time" (04/30/2018)  . Osteoarthritis   . Osteoporosis   . Pneumonia    years ago  . [place]monary [place]ism ([place] 04/30/2018  . Reactive airway disease   . Rupture of bowel ([facility]   . Sepsis ([facility] 10/08/2014  . Stroke ([facility]    "mini stroke"- balance and memory issue  . [place]roke ([place]   . Wrist pain, acute 09/10/2012   Past Surgical History:  Procedure  Laterality Date  . ABDOMINAL HYSTERECTOMY    . ABDOMINAL SURGERY    . APPENDECTOMY    . BLADDER SURGERY    . CATARACT EXTRACTION W/ INTRAOCULAR LENS  IMPLANT, BILATERAL Bilateral   . CHOLECYSTECTOMY    . COLON SURGERY    . COLOSTOMY REVERSAL    . ESOPHAGOGASTRODUODENOSCOPY N/A 10/30/2018   Procedure: ESOPHAGOGASTRODUODENOSCOPY (EGD);  Surgeon: Lin Landsman, MD;  Location: [facility] ENDOSCOPY;  Service: Gastroenterology;  Laterality: N/A;  . INTRAMEDULLARY (IM) NAIL INTERTROCHANTERIC Right 09/13/2016   Procedure: INTRAMEDULLARY (IM) NAIL INTERTROCHANTRIC RIGHT;  Surgeon: Leandrew Koyanagi, MD;  Location: [place];  Service: Orthopedics;  Laterality: Right;  . JOINT REPLACEMENT Bilateral   . JOINT REPLACEMENT    . KNEE  SURGERY    . OSTOMY    . RECTOCELE REPAIR    . TOE SURGERY Right    3rd  . TOTAL HIP ARTHROPLASTY Right 09/30/2017   Procedure: RIGHT HIP IM NAIL REMOVAL WITH CONVERSION TO TOTAL HIP ARTHOPLASTY, anterior approach;  Surgeon: Renette Butters, MD;  Location: [place];  Service: Orthopedics;  Laterality: Right;   Family History  Problem Relation Age of Onset  . Heart failure Mother   . Heart attack Father   . Lung cancer Sister   . Deep vein thrombosis Sister   . Pulmonary embolism Son   . Deep vein thrombosis Son   . Cervical cancer Daughter    Social History   Socioeconomic History  . Marital status: Widowed    Spouse name: Not on file  . Number of children: 3  . Years of education: middle sch  . Highest education level: Not on file  Occupational History  . Occupation: retired    Comment: n/a  Tobacco Use  . Smoking status: Never Smoker  . Smokeless tobacco: Never Used  Substance and Sexual Activity  . Alcohol use: Not Currently  . Drug use: Not Currently  . Sexual activity: Not Currently  Other Topics Concern  . Not on file  Social History Narrative   ** Merged History Encounter **       ** Merged History Encounter **       Patient lives at home with daughter. Caffeine Use: 4 cups daily   Social Determinants of Health   Financial Resource Strain: Low Risk   . Difficulty of Paying Living Expenses: Not hard at all  Food Insecurity: No Food Insecurity  . Worried About Charity fundraiser in the Last Year: Never true  . Ran Out of Food in the Last Year: Never true  Transportation Needs: No Transportation Needs  . Lack of Transportation (Medical): No  . Lack of Transportation (Non-Medical): No  Physical Activity: Inactive  . Days of Exercise per Week: 0 days  . Minutes of Exercise per Session: 0 min  Stress: Stress Concern Present  . Feeling of Stress : Very much  Social Connections:   . Frequency of Communication with Friends and Family:   . Frequency of Social  Gatherings with Friends and Family:   . Attends Religious Services:   . Active Member of Clubs or Organizations:   . Attends Archivist Meetings:   Marland Kitchen Marital Status:     Outpatient Encounter Medications as of 09/16/2019  Medication Sig  . acetaminophen (TYLENOL) 500 MG tablet Take 2 tablets (1,000 mg total) by mouth 3 (three) times daily. (Patient taking differently: Take 1,000 mg by mouth 2 (two) times daily. )  . albuterol (ACCUNEB) 1.25 MG/3ML  nebulizer solution Take 3 mLs (1.25 mg total) by nebulization every 6 (six) hours as needed for wheezing.  Marland Kitchen albuterol (VENTOLIN HFA) 108 (90 Base) MCG/ACT inhaler Inhale 2 puffs into the lungs every 6 (six) hours as needed for wheezing or shortness of breath.  Marland Kitchen alendronate (FOSAMAX) 70 MG tablet TAKE 1 TABLET BY MOUTH ONCE A WEEK  . ALPRAZolam (XANAX) 1 MG tablet TAKE 1 TABLET (1 MG TOTAL) BY MOUTH AT BEDTIME AS NEEDED FOR ANXIETY.  Marland Kitchen amLODipine (NORVASC) 5 MG tablet TAKE 1 TABLET BY MOUTH EVERY DAY  . docusate sodium (COLACE) 100 MG capsule Take 200 mg by mouth 2 (two) times daily as needed for moderate constipation.   . DULoxetine (CYMBALTA) 20 MG capsule Take 2 capsules (40 mg total) by mouth daily.  Marland Kitchen ELIQUIS 2.5 MG TABS tablet TAKE 1 TABLET BY MOUTH TWICE A DAY  . esomeprazole (NEXIUM) 40 MG capsule TAKE 1 CAPSULE BY MOUTH EVERY DAY  . fluticasone (FLONASE) 50 MCG/ACT nasal spray SPRAY 2 SPRAYS INTO EACH NOSTRIL EVERY DAY  . fluticasone (FLOVENT HFA) 220 MCG/ACT inhaler Inhale 1 puff into the lungs 2 (two) times daily.  Marland Kitchen LINZESS 290 MCG CAPS capsule TAKE 1 CAPSULE BY MOUTH EVERY DAY  . lovastatin (MEVACOR) 10 MG tablet TAKE 1 TABLET BY MOUTH EVERY DAY  . NARCAN 4 MG/0.1ML LIQD nasal spray kit   . ondansetron (ZOFRAN ODT) 4 MG disintegrating tablet Take 1 tablet (4 mg total) by mouth every 8 (eight) hours as needed for nausea or vomiting.  . Oxycodone HCl 10 MG TABS Take 10 mg by mouth 3 (three) times daily.  . polyethylene glycol  (MIRALAX / GLYCOLAX) packet Take 17 g by mouth daily as needed (for constipation.).   Marland Kitchen pregabalin (LYRICA) 100 MG capsule TAKE 1 CAPSULE BY MOUTH TWICE A DAY  . promethazine (PHENERGAN) 12.5 MG tablet Take 1 tablet (12.5 mg total) by mouth every 12 (twelve) hours as needed for nausea or vomiting.  . traZODone (DESYREL) 50 MG tablet Take 0.5-1.5 tablets (25-75 mg total) by mouth at bedtime as needed for sleep.   No facility-administered encounter medications on file as of 09/16/2019.    Activities of Daily Living In your present state of health, do you have any difficulty performing the following activities: 09/16/2019  Hearing? N  Vision? Y  Comment macular degeneration  Difficulty concentrating or making decisions? Y  Comment daughter states that she does not remember well  Walking or climbing stairs? N  Dressing or bathing? N  Doing errands, shopping? N  Preparing Food and eating ? N  Using the Toilet? N  In the past six months, have you accidently leaked urine? Y  Comment wears depends at bedtime.  Do you have problems with loss of bowel control? N  Managing your Medications? N  Managing your Finances? N  Housekeeping or managing your Housekeeping? N  Some recent data might be hidden    Patient Care Team: [name], FNP as PCP - General (Nurse Practitioner) Kate Sable, MD as PCP - Cardiology (Cardiology) [name], FNP (Nurse Practitioner)    Assessment:   This is a routine wellness examination for [place].  Exercise Activities and Dietary recommendations Current Exercise Habits: The patient does not participate in regular exercise at present, Exercise limited by: None identified  Goals    . Patient Stated     09/16/2019, I will maintain and continue medications as prescribed.        Fall Risk  Fall Risk  09/16/2019 04/07/2018 04/06/2018 03/19/2018 03/05/2018  Falls in the past year? '1 1 1 ' No No  Comment - - - - -  Number falls in past yr: '1  1 1 ' - -  Injury with Fall? '1 1 1 ' - -  Comment dislocated hip See previous note bruising left thigh, no medical follow up - -  Risk Factor Category  - - - - -  Risk for fall due to : History of fall(s);Medication side effect History of fall(s);Impaired balance/gait;Impaired mobility History of fall(s);Impaired balance/gait;Impaired mobility - -  Follow up Falls evaluation completed;Falls prevention discussed Falls evaluation completed;Education provided Falls prevention discussed - -  Comment - - - - -   Is the patient's home free of loose throw rugs in walkways, pet beds, electrical cords, etc?   yes      Grab bars in the bathroom? yes      Handrails on the stairs?   yes      Adequate lighting?   yes  Timed Get Up and Go performed: N/A  Depression Screen PHQ 2/9 Scores 09/16/2019 04/07/2018 04/06/2018 03/19/2018  PHQ - 2 Score 0 0 0 0  PHQ- 9 Score 0 - - -     Cognitive Function MMSE - Mini Mental State Exam 09/16/2019 12/06/2014 12/06/2014  Not completed: Refused - -  Orientation to time - - 4  Orientation to Place - - 4  Registration - - 3  Attention/ Calculation - - 0  Recall - - 1  Language- name 2 objects - - 2  Language- repeat - - 0  Language- follow 3 step command - - 3  Language- read & follow direction - - 1  Write a sentence - (No Data) 0  Write a sentence-comments - unable to see well enough -  Copy design - (No Data) 0  Copy design-comments - unable to see well enough -  Total score - - 18  Mini Cog  Mini-Cog screen was not completed. Patient refused. Maximum score is 22. A value of 0 denotes this part of the MMSE was not completed or the patient failed this part of the Mini-Cog screening.       Immunization History  Administered Date(s) Administered  . Influenza, High Dose Seasonal PF 02/19/2018, 04/02/2019  . PFIZER SARS-COV-2 Vaccination 07/17/2019, 09/11/2019  . Pneumococcal Conjugate-13 01/27/2014  . Pneumococcal Polysaccharide-23 02/10/2012  . Zoster  Recombinat (Shingrix) 04/02/2019    Qualifies for Shingles Vaccine: administered 04/02/2019  Screening Tests Health Maintenance  Topic Date Due  . TETANUS/TDAP  09/16/2023 (Originally 08/02/1959)  . INFLUENZA VACCINE  12/26/2019  . DEXA SCAN  Completed  . COVID-19 Vaccine  Completed  . PNA vac Low Risk Adult  Completed    Cancer Screenings: Lung: Low Dose CT Chest recommended if Age 109-80 years, 30 pack-year currently smoking OR have quit w/in 15 years. Patient does not qualify. Breast:  Up to date on Mammogram: No, Patient declined  Bone Density/Dexa: Completed 03/08/2015 Colorectal: declined  Additional Screenings:  Hepatitis C Screening: N/A     Plan:   Patient will maintain and continue medications as prescribed.    I have personally reviewed and noted the following in the patient's chart:   . Medical and social history . Use of alcohol, tobacco or illicit drugs  . Current medications and supplements . Functional ability and status . Nutritional status . Physical activity . Advanced directives . List of other physicians . Hospitalizations, surgeries, and ER visits  in previous 12 months . Vitals . Screenings to include cognitive, depression, and falls . Referrals and appointments  In addition, I have reviewed and discussed with patient certain preventive protocols, quality metrics, and best practice recommendations. A written personalized care plan for preventive services as well as general preventive health recommendations were provided to patient.     Andrez Grime, LPN  [id number]

## 2019-09-22 ENCOUNTER — Encounter: Payer: Self-pay | Admitting: Family Medicine

## 2019-09-23 ENCOUNTER — Other Ambulatory Visit: Payer: Self-pay | Admitting: Family Medicine

## 2019-09-24 NOTE — Telephone Encounter (Signed)
Rx was last refilled on 04/30/19 for #60 with 3 refills. Patient was last seen in the office on 07/29/19 and has no upcoming appts. Ok to refill?

## 2019-09-25 DIAGNOSIS — R269 Unspecified abnormalities of gait and mobility: Secondary | ICD-10-CM | POA: Diagnosis not present

## 2019-09-25 DIAGNOSIS — J449 Chronic obstructive pulmonary disease, unspecified: Secondary | ICD-10-CM | POA: Diagnosis not present

## 2019-09-25 DIAGNOSIS — J40 Bronchitis, not specified as acute or chronic: Secondary | ICD-10-CM | POA: Diagnosis not present

## 2019-09-28 DIAGNOSIS — T84038A Mechanical loosening of other internal prosthetic joint, initial encounter: Secondary | ICD-10-CM | POA: Diagnosis not present

## 2019-09-28 DIAGNOSIS — Z96649 Presence of unspecified artificial hip joint: Secondary | ICD-10-CM | POA: Diagnosis not present

## 2019-09-28 DIAGNOSIS — R2689 Other abnormalities of gait and mobility: Secondary | ICD-10-CM | POA: Diagnosis not present

## 2019-09-30 DIAGNOSIS — Z96641 Presence of right artificial hip joint: Secondary | ICD-10-CM | POA: Diagnosis not present

## 2019-10-05 DIAGNOSIS — T84038A Mechanical loosening of other internal prosthetic joint, initial encounter: Secondary | ICD-10-CM | POA: Diagnosis not present

## 2019-10-05 DIAGNOSIS — R2689 Other abnormalities of gait and mobility: Secondary | ICD-10-CM | POA: Diagnosis not present

## 2019-10-05 DIAGNOSIS — Z03818 Encounter for observation for suspected exposure to other biological agents ruled out: Secondary | ICD-10-CM | POA: Diagnosis not present

## 2019-10-05 DIAGNOSIS — Z96649 Presence of unspecified artificial hip joint: Secondary | ICD-10-CM | POA: Diagnosis not present

## 2019-10-08 DIAGNOSIS — R202 Paresthesia of skin: Secondary | ICD-10-CM | POA: Diagnosis not present

## 2019-10-08 DIAGNOSIS — M25551 Pain in right hip: Secondary | ICD-10-CM | POA: Diagnosis not present

## 2019-10-08 DIAGNOSIS — G894 Chronic pain syndrome: Secondary | ICD-10-CM | POA: Diagnosis not present

## 2019-10-08 DIAGNOSIS — G8929 Other chronic pain: Secondary | ICD-10-CM | POA: Diagnosis not present

## 2019-10-08 DIAGNOSIS — Z79891 Long term (current) use of opiate analgesic: Secondary | ICD-10-CM | POA: Diagnosis not present

## 2019-10-08 DIAGNOSIS — M25561 Pain in right knee: Secondary | ICD-10-CM | POA: Diagnosis not present

## 2019-10-14 ENCOUNTER — Encounter: Payer: Self-pay | Admitting: Family Medicine

## 2019-10-18 ENCOUNTER — Other Ambulatory Visit: Payer: Self-pay | Admitting: Family Medicine

## 2019-10-20 ENCOUNTER — Other Ambulatory Visit: Payer: Self-pay | Admitting: Family Medicine

## 2019-10-20 MED ORDER — ALPRAZOLAM 1 MG PO TABS: 1.0000 mg | ORAL_TABLET | Freq: Every evening | ORAL | 1 refills | Status: DC | PRN

## 2019-10-21 ENCOUNTER — Telehealth: Payer: Self-pay

## 2019-10-21 DIAGNOSIS — R2689 Other abnormalities of gait and mobility: Secondary | ICD-10-CM | POA: Diagnosis not present

## 2019-10-21 DIAGNOSIS — T84038A Mechanical loosening of other internal prosthetic joint, initial encounter: Secondary | ICD-10-CM | POA: Diagnosis not present

## 2019-10-21 DIAGNOSIS — Z96649 Presence of unspecified artificial hip joint: Secondary | ICD-10-CM | POA: Diagnosis not present

## 2019-10-21 NOTE — Telephone Encounter (Signed)
Follow up visit with palliative care schedule for home visit on 11/15/19 at 12 pm with Hanley Ben, NP

## 2019-10-25 ENCOUNTER — Other Ambulatory Visit: Payer: Self-pay | Admitting: Family Medicine

## 2019-10-26 DIAGNOSIS — T84038A Mechanical loosening of other internal prosthetic joint, initial encounter: Secondary | ICD-10-CM | POA: Diagnosis not present

## 2019-10-26 DIAGNOSIS — R2689 Other abnormalities of gait and mobility: Secondary | ICD-10-CM | POA: Diagnosis not present

## 2019-10-26 DIAGNOSIS — Z96649 Presence of unspecified artificial hip joint: Secondary | ICD-10-CM | POA: Diagnosis not present

## 2019-10-26 NOTE — Telephone Encounter (Signed)
Last refilled  traZODone (DESYREL) 50 MG tablet 90 tablet 0 06/25/2019   Take 0.5-1.5 tablets (25-75 mg total) by mouth at bedtime as needed for sleep. - Oral

## 2019-10-27 DIAGNOSIS — R269 Unspecified abnormalities of gait and mobility: Secondary | ICD-10-CM | POA: Diagnosis not present

## 2019-10-27 DIAGNOSIS — J449 Chronic obstructive pulmonary disease, unspecified: Secondary | ICD-10-CM | POA: Diagnosis not present

## 2019-10-27 DIAGNOSIS — J40 Bronchitis, not specified as acute or chronic: Secondary | ICD-10-CM | POA: Diagnosis not present

## 2019-11-02 ENCOUNTER — Other Ambulatory Visit: Payer: Self-pay | Admitting: Family Medicine

## 2019-11-02 DIAGNOSIS — T84038A Mechanical loosening of other internal prosthetic joint, initial encounter: Secondary | ICD-10-CM | POA: Diagnosis not present

## 2019-11-02 DIAGNOSIS — Z96649 Presence of unspecified artificial hip joint: Secondary | ICD-10-CM | POA: Diagnosis not present

## 2019-11-02 DIAGNOSIS — R2689 Other abnormalities of gait and mobility: Secondary | ICD-10-CM | POA: Diagnosis not present

## 2019-11-08 ENCOUNTER — Emergency Department (HOSPITAL_COMMUNITY)
Admission: EM | Admit: 2019-11-08 | Discharge: 2019-11-08 | Disposition: A | Discharge status: Home / Self Care | Payer: Medicare Other | Attending: Emergency Medicine | Admitting: Emergency Medicine

## 2019-11-08 ENCOUNTER — Encounter (HOSPITAL_COMMUNITY): Payer: Self-pay | Admitting: Emergency Medicine

## 2019-11-08 DIAGNOSIS — R1084 Generalized abdominal pain: Secondary | ICD-10-CM | POA: Insufficient documentation

## 2019-11-08 DIAGNOSIS — R197 Diarrhea, unspecified: Secondary | ICD-10-CM | POA: Diagnosis not present

## 2019-11-08 DIAGNOSIS — R000 Tachycardia, unspecified: Secondary | ICD-10-CM | POA: Diagnosis not present

## 2019-11-08 DIAGNOSIS — Z5321 Procedure and treatment not carried out due to patient leaving prior to being seen by health care provider: Secondary | ICD-10-CM | POA: Insufficient documentation

## 2019-11-08 DIAGNOSIS — R0902 Hypoxemia: Secondary | ICD-10-CM | POA: Diagnosis not present

## 2019-11-08 DIAGNOSIS — Z743 Need for continuous supervision: Secondary | ICD-10-CM | POA: Diagnosis not present

## 2019-11-08 LAB — CBC
HCT: 40.7 % (ref 36.0–46.0)
Hemoglobin: 12.1 g/dL (ref 12.0–15.0)
MCH: 25.1 pg — ABNORMAL LOW (ref 26.0–34.0)
MCHC: 29.7 g/dL — ABNORMAL LOW (ref 30.0–36.0)
MCV: 84.4 fL (ref 80.0–100.0)
Platelets: 346 10*3/uL (ref 150–400)
RBC: 4.82 MIL/uL (ref 3.87–5.11)
RDW: 16.8 % — ABNORMAL HIGH (ref 11.5–15.5)
WBC: 7 10*3/uL (ref 4.0–10.5)
nRBC: 0 % (ref 0.0–0.2)

## 2019-11-08 LAB — LIPASE, BLOOD: Lipase: 20 U/L (ref 11–51)

## 2019-11-08 LAB — COMPREHENSIVE METABOLIC PANEL
ALT: 25 U/L (ref 0–44)
AST: 30 U/L (ref 15–41)
Albumin: 3.4 g/dL — ABNORMAL LOW (ref 3.5–5.0)
Alkaline Phosphatase: 127 U/L — ABNORMAL HIGH (ref 38–126)
Anion gap: 12 (ref 5–15)
BUN: 6 mg/dL — ABNORMAL LOW (ref 8–23)
CO2: 24 mmol/L (ref 22–32)
Calcium: 10.1 mg/dL (ref 8.9–10.3)
Chloride: 103 mmol/L (ref 98–111)
Creatinine, Ser: 0.96 mg/dL (ref 0.44–1.00)
GFR calc Af Amer: >60 mL/min (ref >60)
GFR calc non Af Amer: 57 mL/min — ABNORMAL LOW (ref >60)
Glucose, Bld: 107 mg/dL — ABNORMAL HIGH (ref 70–99)
Potassium: 3.5 mmol/L (ref 3.5–5.1)
Sodium: 139 mmol/L (ref 135–145)
Total Bilirubin: 0.4 mg/dL (ref 0.3–1.2)
Total Protein: 8.8 g/dL — ABNORMAL HIGH (ref 6.5–8.1)

## 2019-11-08 MED ORDER — SODIUM CHLORIDE 0.9% FLUSH: 3.0000 mL | Freq: Once | INTRAVENOUS | Status: DC

## 2019-11-08 NOTE — ED Triage Notes (Signed)
EMS stated, she has had abdominal cramping since this morning. Normally constipated, Her noramal Bm was 3-4 days ago, yesterday had a bout of diarrhea.

## 2019-11-08 NOTE — ED Notes (Signed)
Pt stated that she wants to go home. Pt stated that her pain has gone away. Pt's vitals reassessed.

## 2019-11-08 NOTE — ED Notes (Signed)
Spoke to CN about pt and wait time. Pt decided to leave and she called daughter to pick her up. Pt asked to be removed from waiting room and will be moved OTF.

## 2019-11-09 DIAGNOSIS — T84038A Mechanical loosening of other internal prosthetic joint, initial encounter: Secondary | ICD-10-CM | POA: Diagnosis not present

## 2019-11-09 DIAGNOSIS — Z96649 Presence of unspecified artificial hip joint: Secondary | ICD-10-CM | POA: Diagnosis not present

## 2019-11-09 DIAGNOSIS — R2689 Other abnormalities of gait and mobility: Secondary | ICD-10-CM | POA: Diagnosis not present

## 2019-11-10 ENCOUNTER — Encounter: Payer: Self-pay | Admitting: Family Medicine

## 2019-11-10 ENCOUNTER — Other Ambulatory Visit: Payer: Self-pay

## 2019-11-10 ENCOUNTER — Ambulatory Visit (INDEPENDENT_AMBULATORY_CARE_PROVIDER_SITE_OTHER): Payer: Medicare Other | Admitting: Family Medicine

## 2019-11-10 VITALS — BP 148/98 | HR 104 | Temp 98.5°F | Ht 64.0 in | Wt 163.0 lb

## 2019-11-10 DIAGNOSIS — J439 Emphysema, unspecified: Secondary | ICD-10-CM | POA: Diagnosis not present

## 2019-11-10 DIAGNOSIS — M542 Cervicalgia: Secondary | ICD-10-CM | POA: Diagnosis not present

## 2019-11-10 DIAGNOSIS — M79642 Pain in left hand: Secondary | ICD-10-CM | POA: Diagnosis not present

## 2019-11-10 DIAGNOSIS — M79641 Pain in right hand: Secondary | ICD-10-CM | POA: Diagnosis not present

## 2019-11-10 NOTE — Patient Instructions (Addendum)
Good to see you today  I think you would benefit from using Voltaren gel on your hands, can also use heat  Do stretches for your neck, you have a neck strain

## 2019-11-10 NOTE — Progress Notes (Signed)
Subjective:    Patient ID: [name], female    DOB: [birth year], 79 y.o.   MRN: [id number]  HPI Chief Complaint  Patient presents with  . Generalized Body Aches    total body aches and pains - worse in hands. Pt states that this has been going on for about 5-6 months.    Has been doing PT and is now walking. Has some elevated blood pressure and heart rate and low oxygen in 90. Some increased anxiety with feeling SOB, not sure which comes first the anxiety or shortness of breath.  Usually resolves after rest and deep breathing. Went to ER with abdominal pain 2 days ago.  Did not stay after waiting for 5 hours.  Had labs drawn.  Symptoms resolved after having BM. Pain in hands and neck. Woke up with neck pain several days ago, and worse after sitting in ER.  Has been taking Tylenol with little relief.  Had an episode of weakness, headache, resolved spontaneously. Recent nonproductive cough.  No fever/chills.  Shortness of breath stable.  Is taking Mucinex.  Feels like she cannot produce sputum but it is in her chest.  Rare wheeze that clears with cough.  Has not used nebulizer treatment  Review of Systems Per HPI    Objective:   Physical Exam Vitals reviewed.  Constitutional:      Appearance: Normal appearance. She is normal weight. She is ill-appearing (chronically).  HENT:     Head: Normocephalic and atraumatic.  Neck:     Comments: Decreased range of motion with rotation to the left.  Tender trapezius muscles bilaterally. Cardiovascular:     Rate and Rhythm: Normal rate and regular rhythm.     Heart sounds: Normal heart sounds.  Pulmonary:     Effort: Pulmonary effort is normal.     Breath sounds: Normal breath sounds.     Comments: Occasional deep, nonproductive cough Musculoskeletal:     Cervical back: Tenderness present.     Right lower leg: No edema.     Left lower leg: No edema.     Comments: Patient ambulating with steady gait using Rollator. Bilateral hands  with chronic appearing arthritic changes of the joints.  No palpable effusions.  Normal color, normal temperature, strong radial pulses.  Skin:    General: Skin is warm and dry.  Neurological:     Mental Status: She is alert and oriented to person, place, and time.  Psychiatric:        Mood and Affect: Mood normal.        Behavior: Behavior normal.        Thought Content: Thought content normal.        Judgment: Judgment normal.          BP (!) 148/98 (BP Location: Left Arm, Patient Position: Sitting, Cuff Size: Normal)   Pulse (!) 104   Temp 98.5 F (36.9 C) (Temporal)   Ht 5\' 4"  (1.626 m)   Wt 163 lb (73.9 kg)   SpO2 95%   BMI 27.98 kg/m  Wt Readings from Last 3 Encounters:  11/10/19 163 lb (73.9 kg)  09/16/19 168 lb (76.2 kg)  09/07/19 179 lb 14.3 oz (81.6 kg)    Assessment & Plan:  1. Neck pain -Suspect acute strain on chronic pain.  Discussed maximum Tylenol dosage and encouraged her to use heat, provided neck stretching exercises for her to do several times a day  2. Bilateral hand pain -This has been ongoing and  suspect osteoarthritis.  Tylenol per #1, discussed use of diclofenac gel up to 4 times a day.  Heat, massage as needed  3. Pulmonary emphysema, unspecified emphysema type ([place]) -Could be early COPD exacerbation.  Continue Mucinex, nebulizer home treatments every 4 to 6 hours as needed.  They will let me know if any worsening of symptoms  This visit occurred during the SARS-CoV-2 public health emergency.  Safety protocols were in place, including screening questions prior to the visit, additional usage of staff PPE, and extensive cleaning of exam room while observing appropriate contact time as indicated for disinfecting solutions.      Clarene Reamer, FNP-BC   Primary Care at [facility], [facility]  11/11/2019 7:48 AM

## 2019-11-11 ENCOUNTER — Encounter: Payer: Self-pay | Admitting: Family Medicine

## 2019-11-11 DIAGNOSIS — M25561 Pain in right knee: Secondary | ICD-10-CM | POA: Diagnosis not present

## 2019-11-11 DIAGNOSIS — G8929 Other chronic pain: Secondary | ICD-10-CM | POA: Diagnosis not present

## 2019-11-11 DIAGNOSIS — G894 Chronic pain syndrome: Secondary | ICD-10-CM | POA: Diagnosis not present

## 2019-11-11 DIAGNOSIS — M25551 Pain in right hip: Secondary | ICD-10-CM | POA: Diagnosis not present

## 2019-11-11 DIAGNOSIS — J439 Emphysema, unspecified: Secondary | ICD-10-CM | POA: Insufficient documentation

## 2019-11-11 DIAGNOSIS — R202 Paresthesia of skin: Secondary | ICD-10-CM | POA: Diagnosis not present

## 2019-11-11 DIAGNOSIS — Z79891 Long term (current) use of opiate analgesic: Secondary | ICD-10-CM | POA: Diagnosis not present

## 2019-11-15 ENCOUNTER — Other Ambulatory Visit: Payer: Self-pay

## 2019-11-15 ENCOUNTER — Other Ambulatory Visit: Payer: Medicare Other | Admitting: Adult Health Nurse Practitioner

## 2019-11-16 ENCOUNTER — Telehealth: Payer: Self-pay | Admitting: Adult Health Nurse Practitioner

## 2019-11-16 DIAGNOSIS — R2689 Other abnormalities of gait and mobility: Secondary | ICD-10-CM | POA: Diagnosis not present

## 2019-11-16 DIAGNOSIS — T84038A Mechanical loosening of other internal prosthetic joint, initial encounter: Secondary | ICD-10-CM | POA: Diagnosis not present

## 2019-11-16 DIAGNOSIS — Z96649 Presence of unspecified artificial hip joint: Secondary | ICD-10-CM | POA: Diagnosis not present

## 2019-11-16 NOTE — Telephone Encounter (Signed)
This is late entry. Arrived at patient's home for scheduled visit on 11/15/19 at 12pm.  Daughter greeted at front door.  Explained that her dogs had Parvo and have not disinfected entire home yet.  Would like call back in one week to reschedule. Namir Neto K. Olena Heckle NP

## 2019-11-18 ENCOUNTER — Telehealth: Payer: Self-pay

## 2019-11-18 DIAGNOSIS — R2689 Other abnormalities of gait and mobility: Secondary | ICD-10-CM | POA: Diagnosis not present

## 2019-11-18 DIAGNOSIS — Z96649 Presence of unspecified artificial hip joint: Secondary | ICD-10-CM | POA: Diagnosis not present

## 2019-11-18 DIAGNOSIS — T84038A Mechanical loosening of other internal prosthetic joint, initial encounter: Secondary | ICD-10-CM | POA: Diagnosis not present

## 2019-11-18 NOTE — Telephone Encounter (Signed)
Agree with having patient elevated if symptoms still present.

## 2019-11-18 NOTE — Telephone Encounter (Signed)
International aid/development worker (DPR signed) (DPR signed) said that pt was at [facility] PT and pt got SOB while at PT; PT cked heart rate 158 and pulse ox 90% at room air;Cathy said they did not take BP. Pt went to [facility] ED on 11/08/19 but LWBS. [name] said pt has not complained of CP,H/A or dizziness. [name] will speak with pt and let her know she needs to go back to UC or ED for eval. [name] said she was in grocery store and about to leave; pt is in car with air on and [name] will advise pt and try to get her to go back to UC or ED. No available appts at [facility] this afternoon. FYI to [name] FNP who is out of office but PCP and sending to Dr Einar Pheasant who is in office.

## 2019-11-19 NOTE — Telephone Encounter (Signed)
Please call and check on patient. Is she having any symptoms today? At PT yesterday, was she having pain or maybe slightly dehydrated? Tell her to increase fluids today, urine should be light yellow.

## 2019-11-19 NOTE — Telephone Encounter (Signed)
Message left for patient to return my call.  

## 2019-11-22 IMAGING — RF DG FLUORO GUIDE NDL PLC/BX
1 series · 1 of 1 positions shown · non-contrast
Comparison: none

CLINICAL DATA: Fluid collection around the neck of the right hip
prosthesis. Prominent right hip and knee pain.

[Series 1: cp_standard · 0.17mm/px · 1 of 1 slices shown]
[im 1/1]
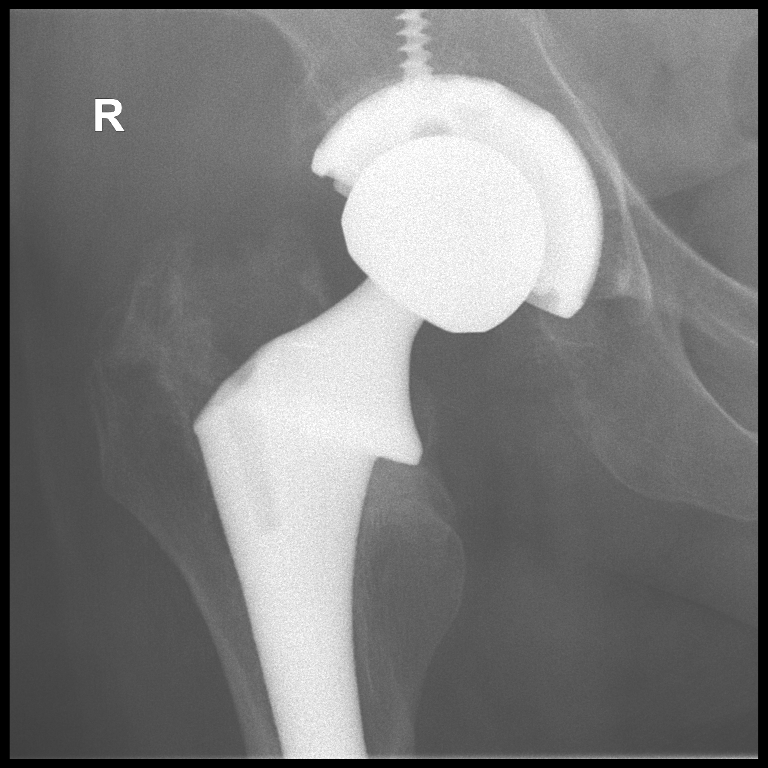

[1 of 1 positions shown; findings below may reference images not displayed]

EXAM:
RIGHT HIP ASPIRATION UNDER FLUOROSCOPY

FLUOROSCOPY TIME:  Fluoroscopy Time:  30 seconds

Radiation Exposure Index (if provided by the fluoroscopic device):
N/A

Number of Acquired Spot Images: 0

PROCEDURE:
I discussed the risks (including hemorrhage, infection, and allergic
reaction, among others), benefits, and alternatives to the procedure
with the patient. We specifically discussed the high technical
likelihood of success of the procedure. The patient understood and
elected to undergo the procedure.

Standard time-out was employed. Following sterile skin prep and
local anesthetic administration consisting of 1% lidocaine, a 22
gauge needle was advanced without difficulty to the metal margin of
the component of the right femoral prosthesis under fluoroscopic
guidance. A total of 16 cc of straw-colored fluid was aspirated from
the joint. The needle was subsequently removed and the skin cleansed
and bandaged. No immediate complications were observed.
IMPRESSION: 1. Successful aspiration of the right hip yielding 16 cc of
straw-colored fluid sent to the lab.

## 2019-11-22 IMAGING — CT CT HIP*R* W/O CM
2 of 4 series · 16 of 46 positions shown, 18 images · non-contrast
Comparison: Right hip radiograph dated 02/09/2018 and CT of the
abdomen pelvis dated 10/26/2017.

CLINICAL DATA: 77-year-old female with worsening right hip pain
since hip replacement.

EXAM:
CT OF THE RIGHT HIP WITHOUT CONTRAST
TECHNIQUE: Multidetector CT imaging of the right hip was performed according to
the standard protocol. Multiplanar CT image reconstructions were
also generated.

[Series 3: hip 2.0 (person_name) (person_name) · axial · 0.51mm/px · z∈[+572,+830]mm · 13 of 149 slices shown, 15 images]
[im 10/149  soft-tissue]
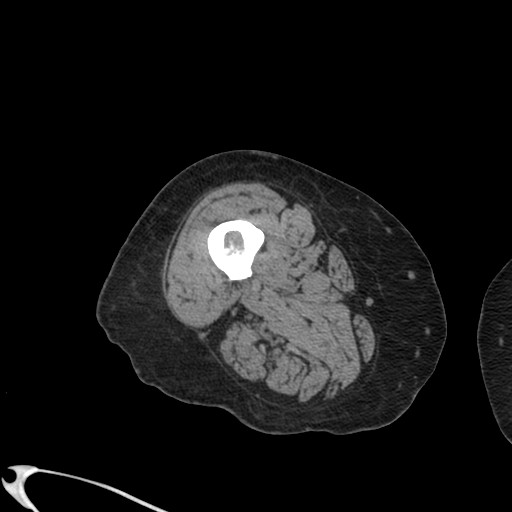
[im 10/149  bone]
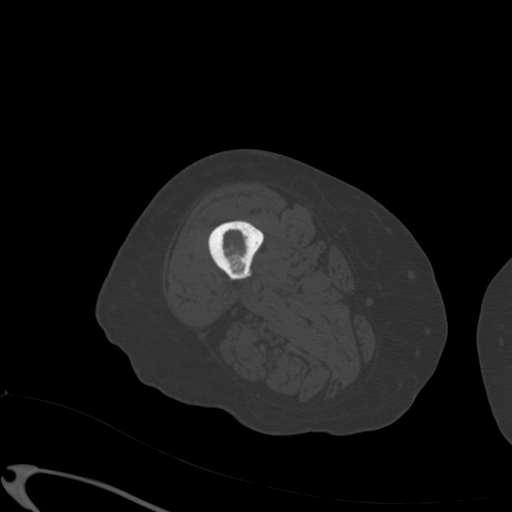
[im 20/149  soft-tissue]
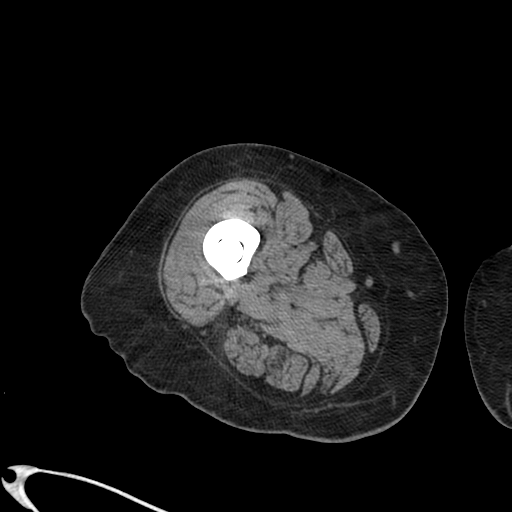
[im 30/149  soft-tissue]
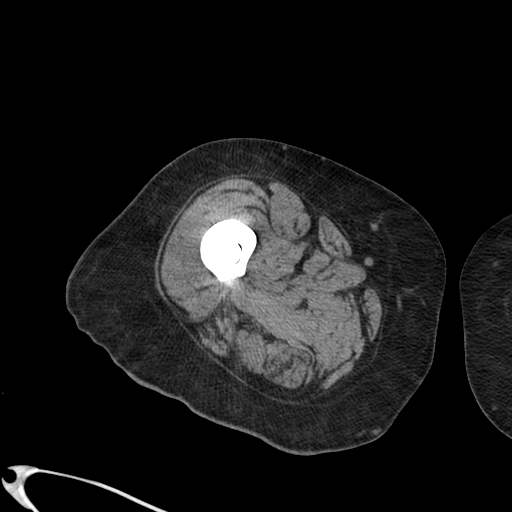
[im 40/149  soft-tissue]
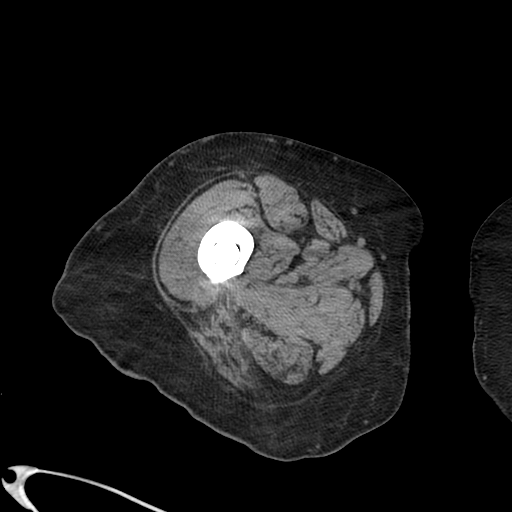
[im 50/149  soft-tissue]
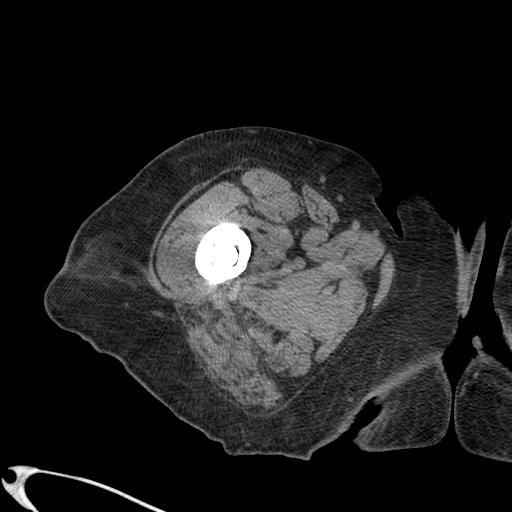
[im 60/149  soft-tissue]
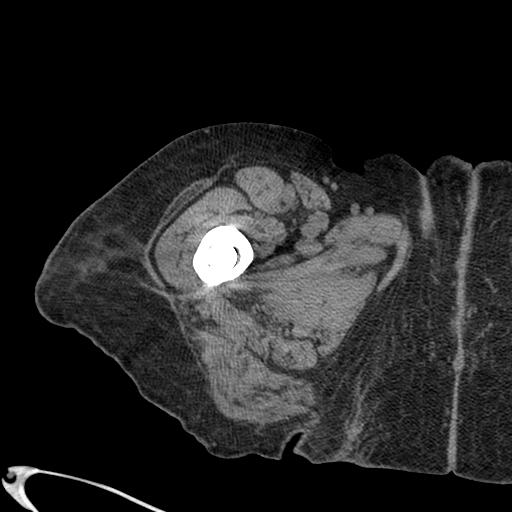
[im 79/149  soft-tissue]
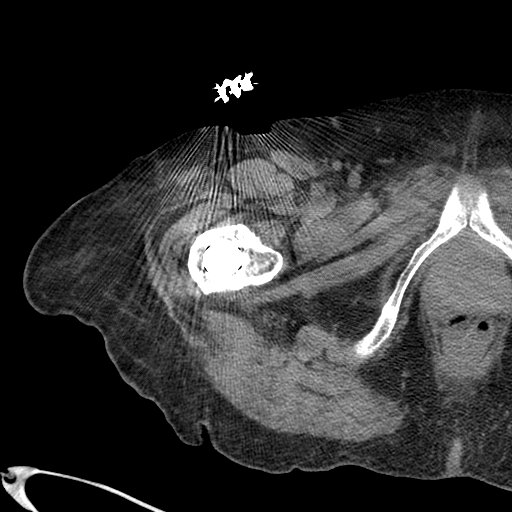
[im 89/149  soft-tissue]
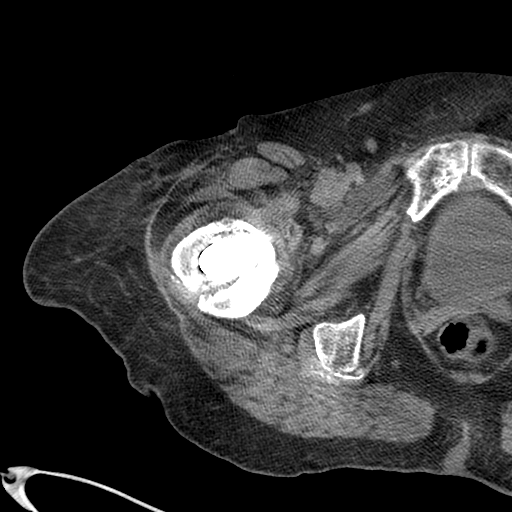
[im 99/149  soft-tissue]
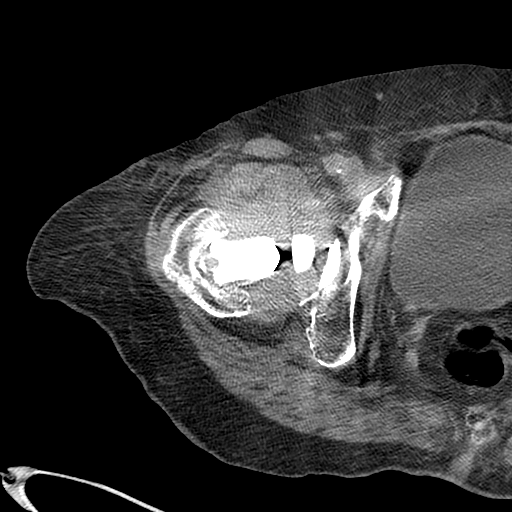
[im 99/149  bone]
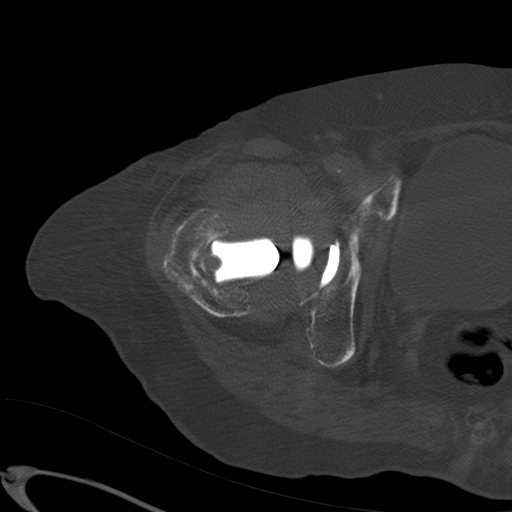
[im 109/149  soft-tissue]
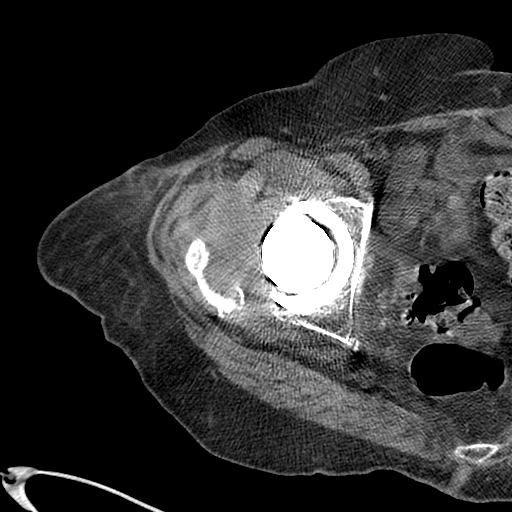
[im 119/149  soft-tissue]
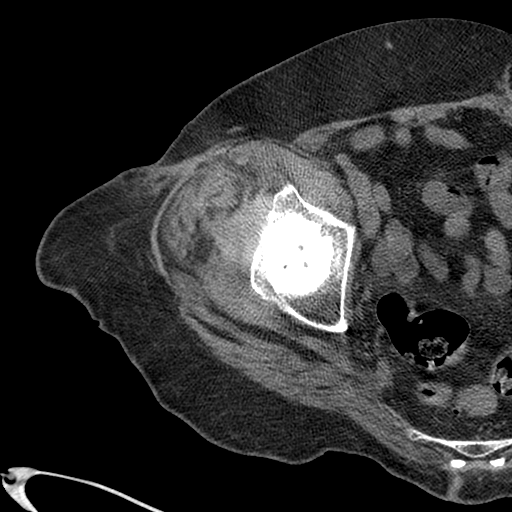
[im 129/149  soft-tissue]
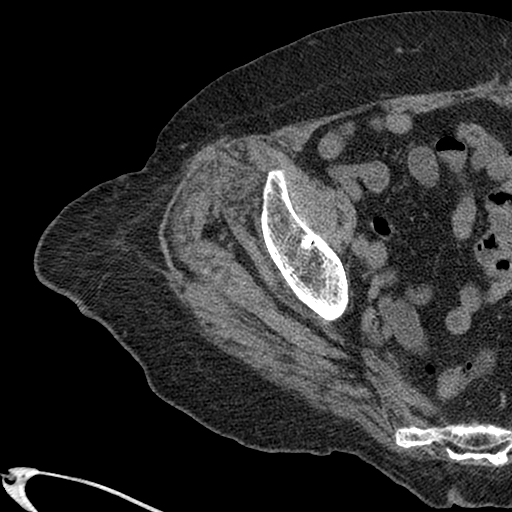
[im 139/149  soft-tissue]
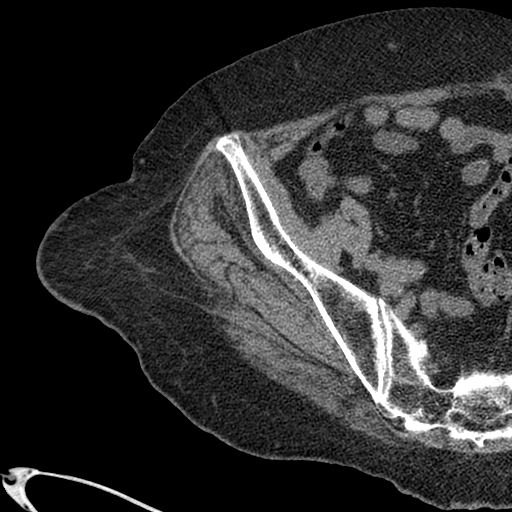

[Series 6: hip 2.0 cor. (person_name) · coronal · 0.47mm/px · 3 of 106 slices shown]
[im 36/106  soft-tissue]
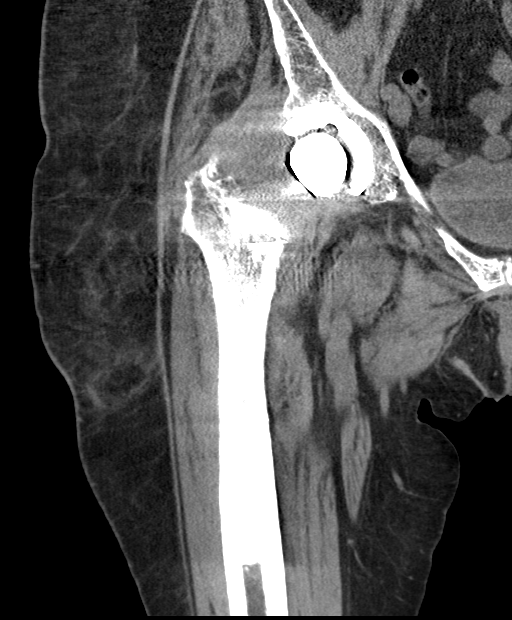
[im 47/106  soft-tissue]
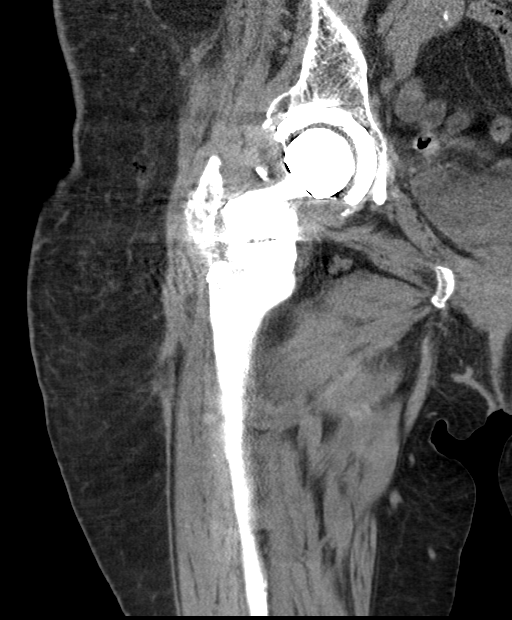
[im 59/106  soft-tissue]
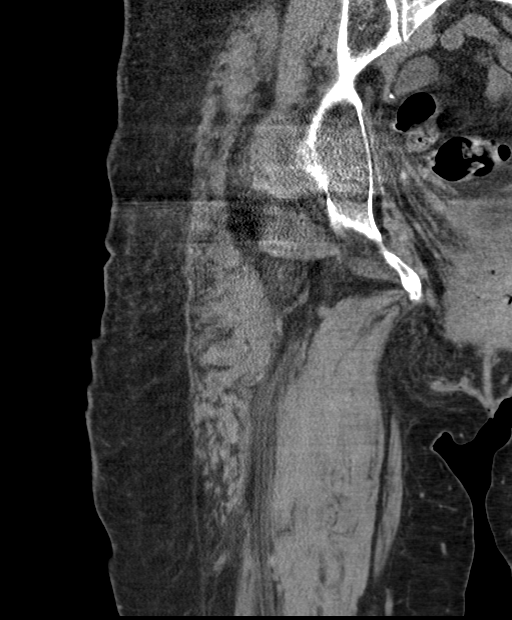

[16 of 46 positions shown; findings below may reference images not displayed]

FINDINGS: Evaluation of this exam is limited in the absence of intravenous
contrast. Evaluation is also limited due to streak artifact caused
by metallic arthroplasty.

Bones/Joint/Cartilage

There is a total right hip arthroplasty which appears intact and in
anatomic alignment. There is satisfactory contact between the
arthroplasty and bone. There is no acute fracture or dislocation.
There is soft tissue thickening around the neck of the arthroplasty
which may represent effusion. Evaluation of this area is limited due
to streak artifact caused by arthroplasty. Ultrasound may provide
better evaluation.

Ligaments

Suboptimally assessed by CT.

Muscles and Tendons

No acute findings.  No intramuscular hematoma.

Soft tissues

Right hip subcutaneous scarring, postsurgical.  No fluid collection.
IMPRESSION: 1. No acute fracture or dislocation.
2. Right hip arthroplasty appears intact with no evidence of
loosening. There may fluid within the joint adjacent to the neck of
the arthroplasty. Ultrasound may provide better evaluation.

## 2019-11-22 NOTE — Telephone Encounter (Signed)
Virtual or in office?

## 2019-11-22 NOTE — Telephone Encounter (Signed)
In the office since symptoms have been persistent.

## 2019-11-22 NOTE — Telephone Encounter (Signed)
Pt scheduled with [name] on Wed 6/30 at 2pm in office Advised that if she develops any new symptoms or fever to contact the office.   Nothing further needed.

## 2019-11-22 NOTE — Telephone Encounter (Signed)
Noted  

## 2019-11-22 NOTE — Telephone Encounter (Signed)
Pt states that she is having more mucous production with the cough and congestion - thick white.  Denies fever - states that she has been having some chills and sweating at times.  Using her neb for the cough and congestion -- could not specify how often she is using this when [place] asked her she kept stating that she "uses it when she coughs"

## 2019-11-22 NOTE — Telephone Encounter (Signed)
Please call and see if she wants an appointment on Wed?

## 2019-11-22 NOTE — Telephone Encounter (Signed)
Please call and see if she is having any increased sputum production? Fever? Is she doing nebulizer treatments for cough/ congestion?

## 2019-11-22 NOTE — Telephone Encounter (Signed)
Spoke with [name], states that the patient is still having elevated HR - range in the 150's with minimal exertion.  Pt is also having SOB with exertion - walking short distance - [name] states that the she lives in a mobile home so walking from the living room to the bathroom (short distance) is hard for her. She becomes very short of breath, rapid and shallow breaths like she is having a "panic attack". The lowest her O2 has gotten has been 89%. Pt seems to be about the same with her congestion - Mucinex is not helping much at all. Pt has also increased her fluid intake as tolerated - drinking about 2.5 to 3 bottles of water a day and urinating in between. Urine does look a little concentrated, not light yellow, pt Is not having any symptoms of a UTI or Bladder infection.   Will send to [facility] as FYI.

## 2019-11-23 ENCOUNTER — Other Ambulatory Visit: Payer: Self-pay

## 2019-11-24 ENCOUNTER — Ambulatory Visit (INDEPENDENT_AMBULATORY_CARE_PROVIDER_SITE_OTHER): Payer: Medicare Other | Admitting: Family Medicine

## 2019-11-24 ENCOUNTER — Encounter: Payer: Self-pay | Admitting: Family Medicine

## 2019-11-24 VITALS — BP 122/78 | HR 120 | Temp 98.9°F | Ht 64.0 in | Wt 163.8 lb

## 2019-11-24 DIAGNOSIS — J439 Emphysema, unspecified: Secondary | ICD-10-CM | POA: Diagnosis not present

## 2019-11-24 DIAGNOSIS — R059 Cough, unspecified: Secondary | ICD-10-CM

## 2019-11-24 DIAGNOSIS — R05 Cough: Secondary | ICD-10-CM | POA: Diagnosis not present

## 2019-11-24 DIAGNOSIS — R0600 Dyspnea, unspecified: Secondary | ICD-10-CM

## 2019-11-24 DIAGNOSIS — R0902 Hypoxemia: Secondary | ICD-10-CM

## 2019-11-24 DIAGNOSIS — R0609 Other forms of dyspnea: Secondary | ICD-10-CM

## 2019-11-24 DIAGNOSIS — R000 Tachycardia, unspecified: Secondary | ICD-10-CM | POA: Diagnosis not present

## 2019-11-24 NOTE — Progress Notes (Signed)
Ambulatory Oximetry   Room Air at Rest: 93%  118HR Room Air Ambulating:  88%  155 HR 2 Liters O2 via [place] with Ambulation:  96%  145HR 2 liters O2 via [place] at Rest, after recovery: 96%  133HR  Varney Daily, CMA 11/24/19

## 2019-11-24 NOTE — Patient Instructions (Signed)
Good to see you today  I have ordered a heart ultrasound, you will get a call about scheduling.   Fast heart beat could be caused by mild dehydration, being out of shape  INCREASE YOUR WATER by at least 24 ounces

## 2019-11-24 NOTE — Progress Notes (Signed)
Subjective:    Patient ID: [name], female    DOB: 1940/09/15, 79 y.o.   MRN: [id number]  HPI Chief Complaint  Patient presents with  . Cough    cough, congestion, SOB, some mucous prodcutive- thick white. O2 lowest reading was 89% - no home O2  . Tachycardia    Range 150s, some SOB  . Hypertension    Range 140s/90s and higher  This is a 79 year old female accompanied by her daughter.  She presents today with concern for episodes of DOE and tachycardia.  Over the last couple of months, she has, with help of physical therapy, being able to walk again using a walker.  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  Cough-she has a history of COPD and has some intermittent cough with white sputum.  She feels like there is something stuck in her upper chest.  She takes Mucinex periodically.  No fevers/chills.  Occasional wheeze.  Uses home nebulizer as needed.    Review of Systems No chest pain, no leg swelling, no PND    Objective:   Physical Exam Vitals reviewed.  Constitutional:      General: She is not in acute distress.    Appearance: Normal appearance. She is normal weight. She is not ill-appearing, toxic-appearing or diaphoretic.  HENT:     Head: Normocephalic and atraumatic.  Cardiovascular:     Rate and Rhythm: Normal rate and regular rhythm.     Heart sounds: Normal heart sounds.  Pulmonary:     Effort: Pulmonary effort is normal.     Breath sounds: Normal breath sounds.  Musculoskeletal:     Right lower leg: No edema.     Left lower leg: No edema.  Skin:    General: Skin is warm and dry.     Comments: Poor skin turgor.  Neurological:     Mental Status: She is alert.       BP 122/78 (BP Location: Right Arm, Patient Position: Sitting, Cuff Size: Normal)   Pulse (!) 120   Temp 98.9 F (37.2 C) (Temporal)   Ht 5\' 4"  (1.626 m)   Wt 163 lb 12.8 oz (74.3 kg)   SpO2 93%   BMI 28.12 kg/m  EKG-  sinus rhythm with pause, rate 88, PR 162, QT 314  Pulse ox at rest 93% Pulse ox with ambulation 88%, ambulating on 2 liters O2 96%    Assessment & Plan:  1. Tachycardia -Seems to be with activity only, EKG with heart rate 88.  Will get echo and refer to cardiology as needed -Suspect that she is mildly dehydrated and discussed this with patient and her daughter. Encouraged increased fluid intake - EKG 12-Lead - ECHOCARDIOGRAM COMPLETE; Future - For home use only DME oxygen  2. Hypoxia - For home use only DME oxygen  3. Pulmonary emphysema, unspecified emphysema type ([place]) -Continue current meds and follow-up with pulmonary - For home use only DME oxygen  4. Dyspnea on exertion - ECHOCARDIOGRAM COMPLETE; Future - For home use only DME oxygen  5. Cough -She sounds clear, seems to be more related to postnasal drainage, upper airway. Continue inhalers/nebulizer treatments, Mucinex, encouraged improved fluid intake  This visit occurred during the SARS-CoV-2 public health emergency.  Safety protocols were in place, including screening questions prior to the visit, additional usage of staff PPE, and extensive cleaning of exam room while observing appropriate contact time as indicated for disinfecting solutions.  Clarene Reamer, FNP-BC  [place] [facility] at [facility], [facility]  11/27/2019 7:32 AM

## 2019-11-25 ENCOUNTER — Telehealth: Payer: Self-pay | Admitting: Adult Health Nurse Practitioner

## 2019-11-25 NOTE — Telephone Encounter (Signed)
Called daughter.  Scheduled appointment for 12/06/19 at 12:00 Robson Trickey K. Olena Heckle NP

## 2019-11-26 ENCOUNTER — Telehealth: Payer: Self-pay

## 2019-11-26 DIAGNOSIS — R269 Unspecified abnormalities of gait and mobility: Secondary | ICD-10-CM | POA: Diagnosis not present

## 2019-11-26 DIAGNOSIS — J449 Chronic obstructive pulmonary disease, unspecified: Secondary | ICD-10-CM | POA: Diagnosis not present

## 2019-11-26 DIAGNOSIS — J40 Bronchitis, not specified as acute or chronic: Secondary | ICD-10-CM | POA: Diagnosis not present

## 2019-11-26 NOTE — Telephone Encounter (Signed)
Noted  

## 2019-11-26 NOTE — Telephone Encounter (Signed)
[name], NP has placed all orders and info needed for in home oxygen approval in the 11/24/19 office visit encounter.  I have placed a message with referral information and sent to our community liaison's ([id number] Sheran Lawless, Jonn Shingles, and Skeet Latch).  They will access the information and coordinate set up with patient.  If any additional questions or concerns with this they will let me know.   Thanks.

## 2019-11-28 ENCOUNTER — Encounter: Payer: Self-pay | Admitting: Family Medicine

## 2019-11-29 ENCOUNTER — Other Ambulatory Visit: Payer: Self-pay | Admitting: Family Medicine

## 2019-12-01 DIAGNOSIS — M85851 Other specified disorders of bone density and structure, right thigh: Secondary | ICD-10-CM | POA: Diagnosis not present

## 2019-12-01 DIAGNOSIS — Z471 Aftercare following joint replacement surgery: Secondary | ICD-10-CM | POA: Diagnosis not present

## 2019-12-01 DIAGNOSIS — Z96641 Presence of right artificial hip joint: Secondary | ICD-10-CM | POA: Diagnosis not present

## 2019-12-01 DIAGNOSIS — M461 Sacroiliitis, not elsewhere classified: Secondary | ICD-10-CM | POA: Diagnosis not present

## 2019-12-01 DIAGNOSIS — M1612 Unilateral primary osteoarthritis, left hip: Secondary | ICD-10-CM | POA: Diagnosis not present

## 2019-12-03 DIAGNOSIS — R05 Cough: Secondary | ICD-10-CM | POA: Diagnosis not present

## 2019-12-03 DIAGNOSIS — R0602 Shortness of breath: Secondary | ICD-10-CM | POA: Diagnosis not present

## 2019-12-03 DIAGNOSIS — R000 Tachycardia, unspecified: Secondary | ICD-10-CM | POA: Diagnosis not present

## 2019-12-03 DIAGNOSIS — I10 Essential (primary) hypertension: Secondary | ICD-10-CM | POA: Diagnosis not present

## 2019-12-06 ENCOUNTER — Other Ambulatory Visit: Payer: Medicare Other | Admitting: Adult Health Nurse Practitioner

## 2019-12-06 ENCOUNTER — Other Ambulatory Visit: Payer: Self-pay

## 2019-12-08 ENCOUNTER — Encounter: Payer: Self-pay | Admitting: Family Medicine

## 2019-12-09 IMAGING — NM NM BONE 3 PHASE
10 series · 20 of 20 positions shown · non-contrast
Comparison: None

Correlation: CT RIGHT hip 02/17/2018

CLINICAL DATA: Constant pain in RIGHT hip since hip replacement in

EXAM:
NUCLEAR MEDICINE 3-PHASE BONE SCAN
TECHNIQUE: Radionuclide angiographic images, immediate static blood pool
images, and 3-hour delayed static images were obtained of the hips
after intravenous injection of radiopharmaceutical.
RADIOPHARMACEUTICALS:  21.3 mCi Cc-SSm MDP IV

[Series 1: flow · 2.07mm/px · 6 of 48 frames shown (1 of 2)]
[frame 5/48]
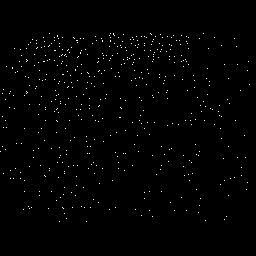
[frame 13/48  full-range]
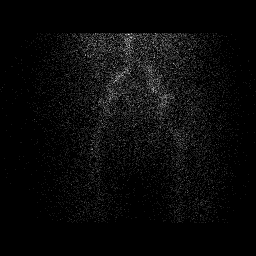
[frame 21/48  full-range]
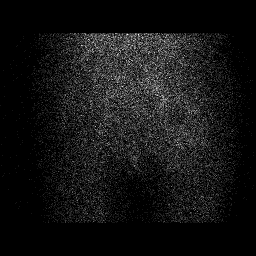
[frame 29/48  full-range]
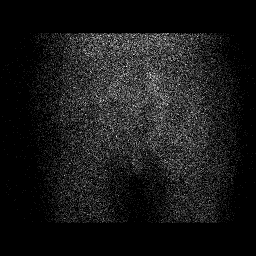
[frame 37/48  full-range]
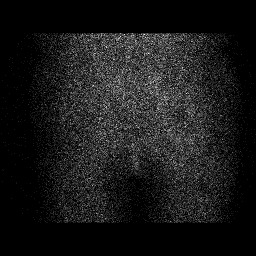
[frame 45/48  full-range]
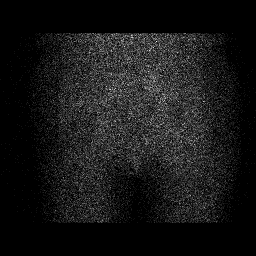

[Series 1: flow · 2.07mm/px · 6 of 48 frames shown (2 of 2)]
[frame 5/48]
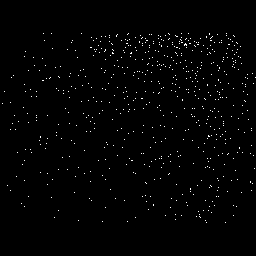
[frame 13/48  full-range]
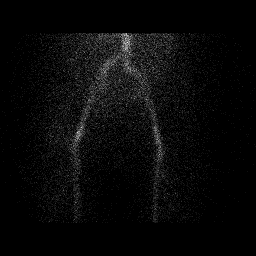
[frame 21/48  full-range]
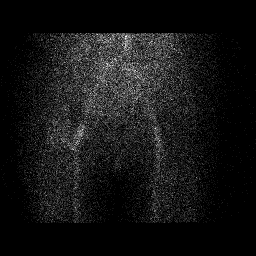
[frame 29/48  full-range]
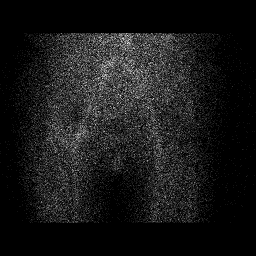
[frame 37/48  full-range]
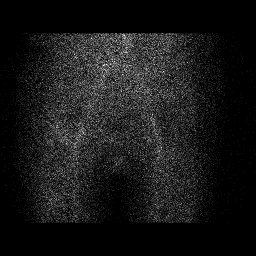
[frame 45/48  full-range]
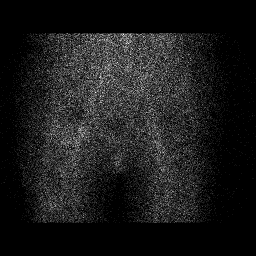

[Series 2: blood pool · 2.07mm/px · 1 of 1 slices shown (1 of 2)]
[im 1/1  full-range]
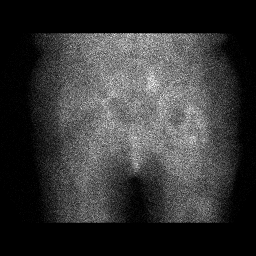

[Series 2: blood pool · 2.07mm/px · 1 of 1 slices shown (2 of 2)]
[im 1/1]
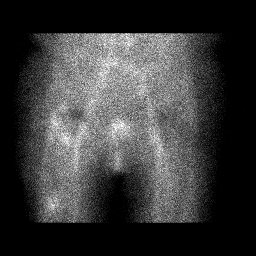

[Series 3: lat bp · 2.07mm/px · 1 of 1 slices shown (1 of 2)]
[im 1/1]
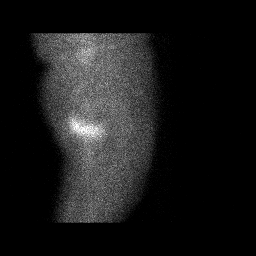

[Series 3: lat bp · 2.07mm/px · 1 of 1 slices shown (2 of 2)]
[im 1/1]
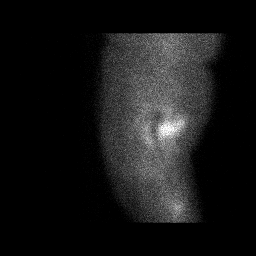

[Series 4: delay · delayed · 2.07mm/px · 1 of 1 slices shown (1 of 4)]
[im 1/1]
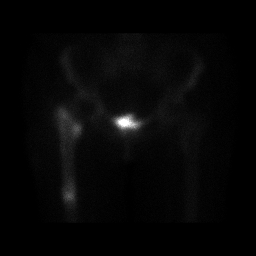

[Series 4: delay · delayed · 2.07mm/px · 1 of 1 slices shown (2 of 4)]
[im 1/1]
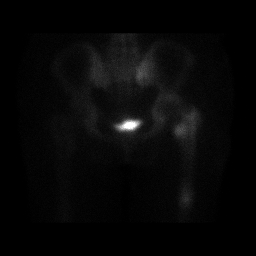

[Series 5: delay · delayed · 2.07mm/px · 1 of 1 slices shown (3 of 4)]
[im 1/1]
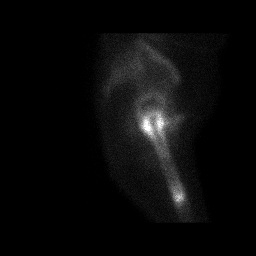

[Series 5: delay · delayed · 2.07mm/px · 1 of 1 slices shown (4 of 4)]
[im 1/1]
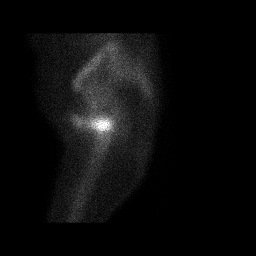

[20 of 20 positions shown; findings below may reference images not displayed]

FINDINGS: Vascular phase: Mildly increased blood flow to the RIGHT hip region
the level of proximal femur. Remaining visualized flow at the pelvis
and LEFT hip is normal.

Blood pool phase: Photopenic defect at RIGHT hip from hip
prosthesis. Mildly increased blood pool is seen at the RIGHT hip
region the level of the hip joint and proximal RIGHT femur.

Delayed phase: Abnormal increased delayed localization of tracer is
identified adjacent to the proximal distal aspects of the femoral
component of the RIGHT hip prosthesis. This pattern is consistent
with loosening of the femoral component of the prosthesis. However,
it is uncertain as to how much of this uptake could be related to
relatively recent interval since surgery. No abnormal tracer
localization at the pelvis or LEFT hip/femur.
IMPRESSION: Mildly increased blood flow and blood pool tracer at the RIGHT hip
region the level of the proximal femur.

Abnormal increased tracer localization adjacent to the proximal and
distal aspects of the femoral component of the RIGHT hip prosthesis
on delayed images, a pattern consistent with aseptic loosening of
the femoral component of the prosthesis; however, it is uncertain to
what degree the recent interval since surgery contributes to this
uptake.

## 2019-12-09 NOTE — Telephone Encounter (Signed)
Received call from Darlina Guys [facility]) who has been managing patients oxygen.  She is returning my call of report of unsatisfactory care and attention that I made last night since, as of that time, our patient still had not received the correct oxygen device.   Melissa reassured me that she got my message last night and sent a triage team out to the house to set her up immediately with an in home unit.  In addition, she assured me that this was being investigated at the highest level and she has no idea why it has taken them 2 weeks to get the patient what she needs and acknowledges this is inexcusable.  She states that normal turn around time is 24 hours.    I did call [name] and confirmed that patient received the right equipment last night, is doing well and doesn't need anything further.   I returned [facility]) call, thanked her for the immediate response last night and informed her that I hope this problem gets fixed as we cannot continue to refer to them if they are not efficient with their services.  Thank goodness this patient was stable but this could have been a potentially serious complication for someone else.  Melissa thanked me for the feedback and reassured me they take this very seriously and it is being looked at at all levels.

## 2019-12-09 NOTE — Telephone Encounter (Signed)
Noted  

## 2019-12-14 DIAGNOSIS — G894 Chronic pain syndrome: Secondary | ICD-10-CM | POA: Diagnosis not present

## 2019-12-14 DIAGNOSIS — R202 Paresthesia of skin: Secondary | ICD-10-CM | POA: Diagnosis not present

## 2019-12-14 DIAGNOSIS — M25551 Pain in right hip: Secondary | ICD-10-CM | POA: Diagnosis not present

## 2019-12-14 DIAGNOSIS — Z79891 Long term (current) use of opiate analgesic: Secondary | ICD-10-CM | POA: Diagnosis not present

## 2019-12-14 DIAGNOSIS — G8929 Other chronic pain: Secondary | ICD-10-CM | POA: Diagnosis not present

## 2019-12-14 DIAGNOSIS — M25561 Pain in right knee: Secondary | ICD-10-CM | POA: Diagnosis not present

## 2019-12-14 NOTE — Telephone Encounter (Signed)
Flovent last refilled May 2020  Pt states that she still takes this -- are you okay with continuing to refill?  Please advise, thanks.

## 2019-12-16 ENCOUNTER — Other Ambulatory Visit: Payer: Medicare Other | Admitting: Adult Health Nurse Practitioner

## 2019-12-16 ENCOUNTER — Other Ambulatory Visit: Payer: Self-pay

## 2019-12-16 ENCOUNTER — Telehealth: Payer: Self-pay | Admitting: Adult Health Nurse Practitioner

## 2019-12-16 NOTE — Telephone Encounter (Signed)
Called to confirm [facility] visit and daughter rescheduled for 01-03-20 at 11:00.

## 2019-12-17 ENCOUNTER — Other Ambulatory Visit: Payer: Self-pay | Admitting: Family Medicine

## 2019-12-17 NOTE — Telephone Encounter (Signed)
Patient's [name] called office stating that patient Xanax 1MG  prescription was not refilled. Patient needs prescription called in asap.

## 2019-12-20 ENCOUNTER — Encounter: Payer: Self-pay | Admitting: Family Medicine

## 2019-12-20 NOTE — Telephone Encounter (Signed)
Last office visit 11/24/2019 for Cough, Tachycardia, HTN.  Last refilled 10/20/2019 for #30 with 1 refill. Next Appt: 01/07/2020 for cyst on head.

## 2019-12-20 NOTE — Telephone Encounter (Signed)
See refill request.

## 2019-12-21 NOTE — Telephone Encounter (Signed)
Refill sent.

## 2019-12-22 ENCOUNTER — Other Ambulatory Visit: Payer: Self-pay | Admitting: Family Medicine

## 2019-12-22 NOTE — Telephone Encounter (Signed)
Rx was last refilled on 09/24/19 for #180 with 0 refills.  Patient was last seen for an OV on 11/24/19 and has an upcoming acute visit on 01/07/20.

## 2019-12-24 ENCOUNTER — Other Ambulatory Visit: Payer: Medicare Other

## 2019-12-25 ENCOUNTER — Other Ambulatory Visit: Payer: Self-pay | Admitting: Family Medicine

## 2019-12-25 DIAGNOSIS — R112 Nausea with vomiting, unspecified: Secondary | ICD-10-CM

## 2019-12-30 ENCOUNTER — Inpatient Hospital Stay: Payer: Medicare Other | Admitting: Oncology

## 2019-12-30 ENCOUNTER — Inpatient Hospital Stay: Payer: Medicare Other | Attending: Oncology

## 2019-12-30 NOTE — Telephone Encounter (Signed)
Last filled on 07/21/19 #20 tabs with 0 refills. Next OV is an ?cyst on head appt on 01/07/20

## 2020-01-01 IMAGING — CR DG HIP (WITH OR WITHOUT PELVIS) 2-3V*R*
3 series · 3 of 3 positions shown · non-contrast
Comparison: February 09, 2018

CLINICAL DATA: Right hip pain. Fall a few days ago. Replacement 6
months ago.

EXAM:
DG HIP (WITH OR WITHOUT PELVIS) 2-3V RIGHT

[pelvis ap]
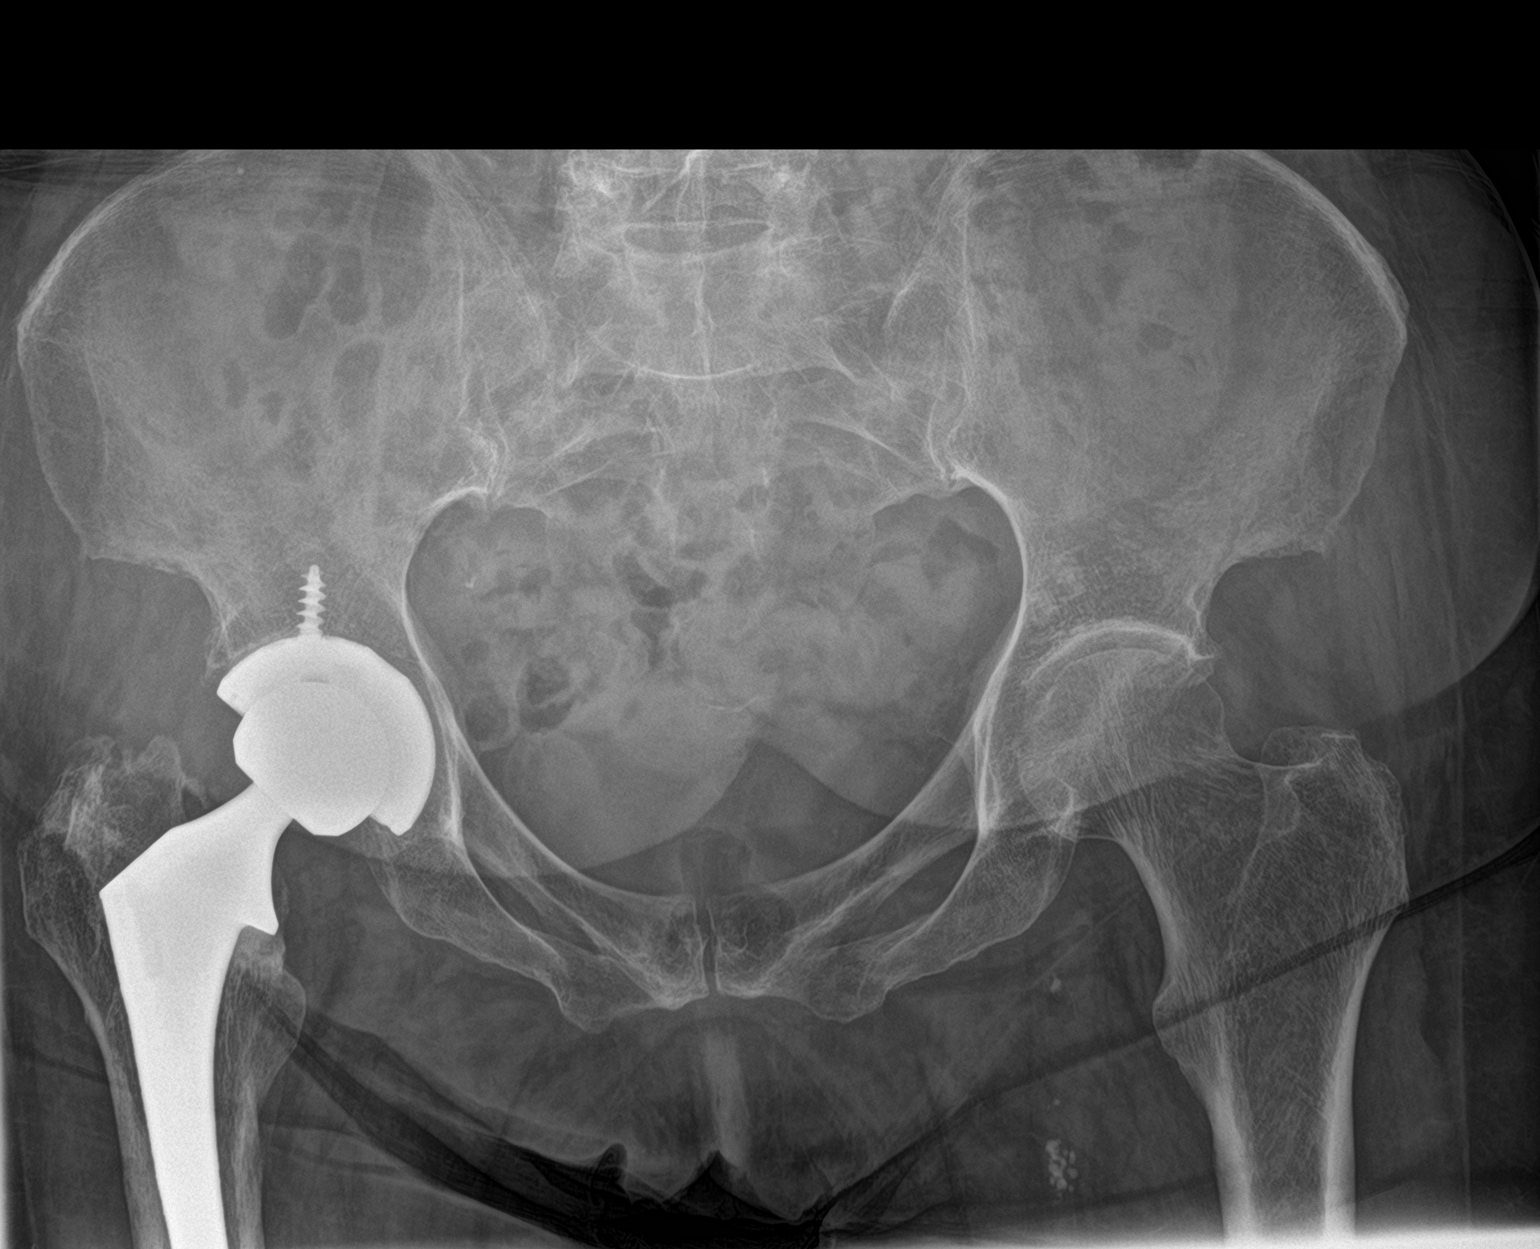

[hip ap]
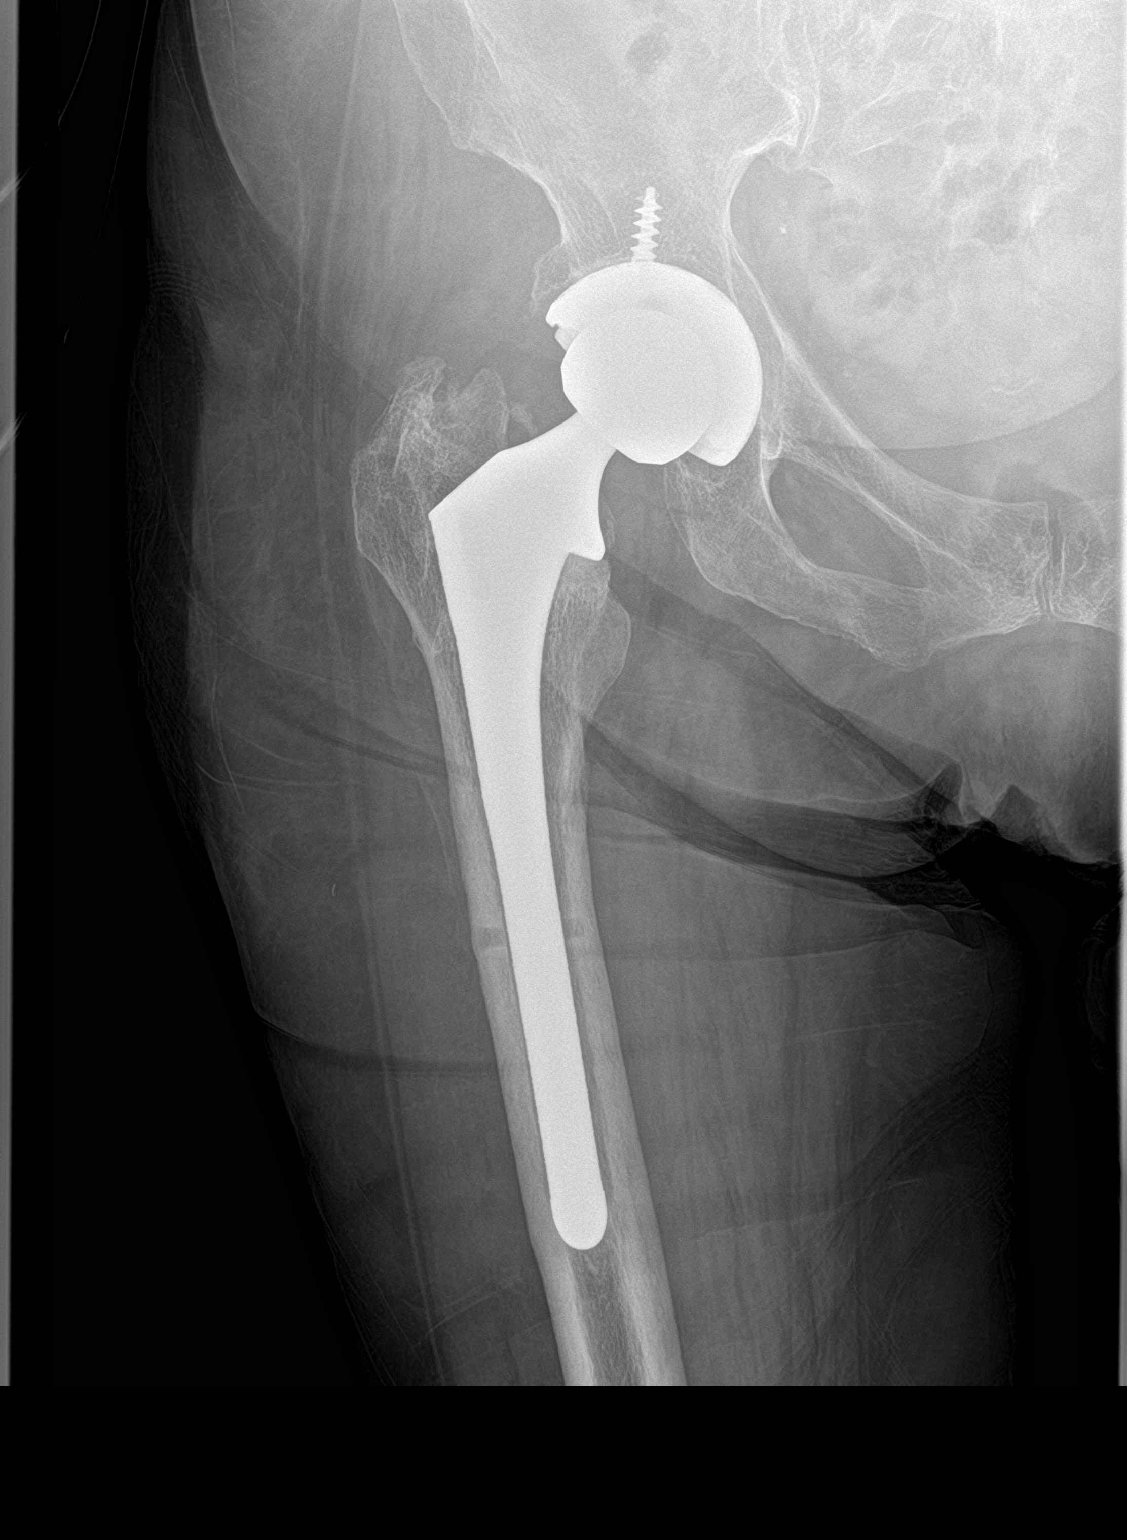

[hip lat]
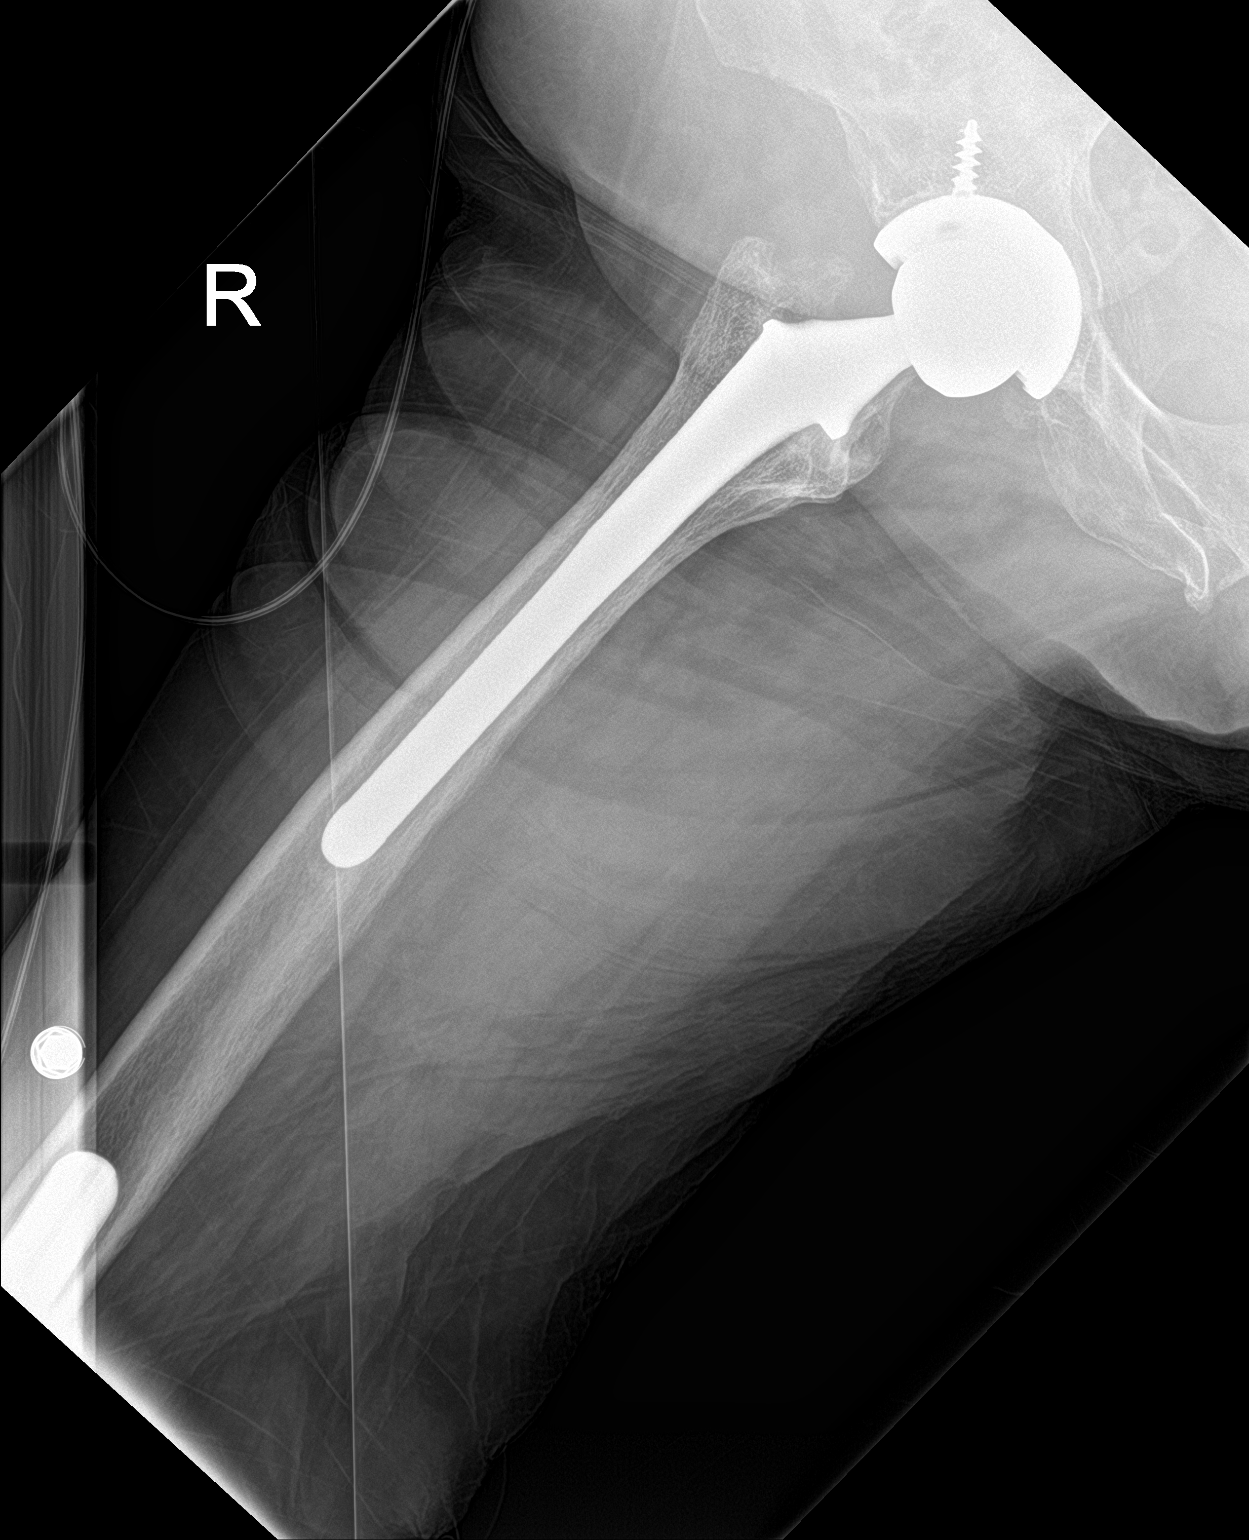

[3 of 3 positions shown; findings below may reference images not displayed]

FINDINGS: The patient is status post right hip replacement. Acetabular and
femoral components are in good position. No dislocation. No
fractures identified. No evidence of hardware failure.
IMPRESSION: Right hip replacement. No acute fracture noted. No evidence of
hardware failure.

## 2020-01-01 IMAGING — CR DG LUMBAR SPINE COMPLETE 4+V
5 series · 5 of 5 positions shown · non-contrast
Comparison: 03/26/2018 lumbar spine MRI

CLINICAL DATA: Fall with right hip pain.

EXAM:
LUMBAR SPINE - COMPLETE 4+ VIEW

[l-spine ap]
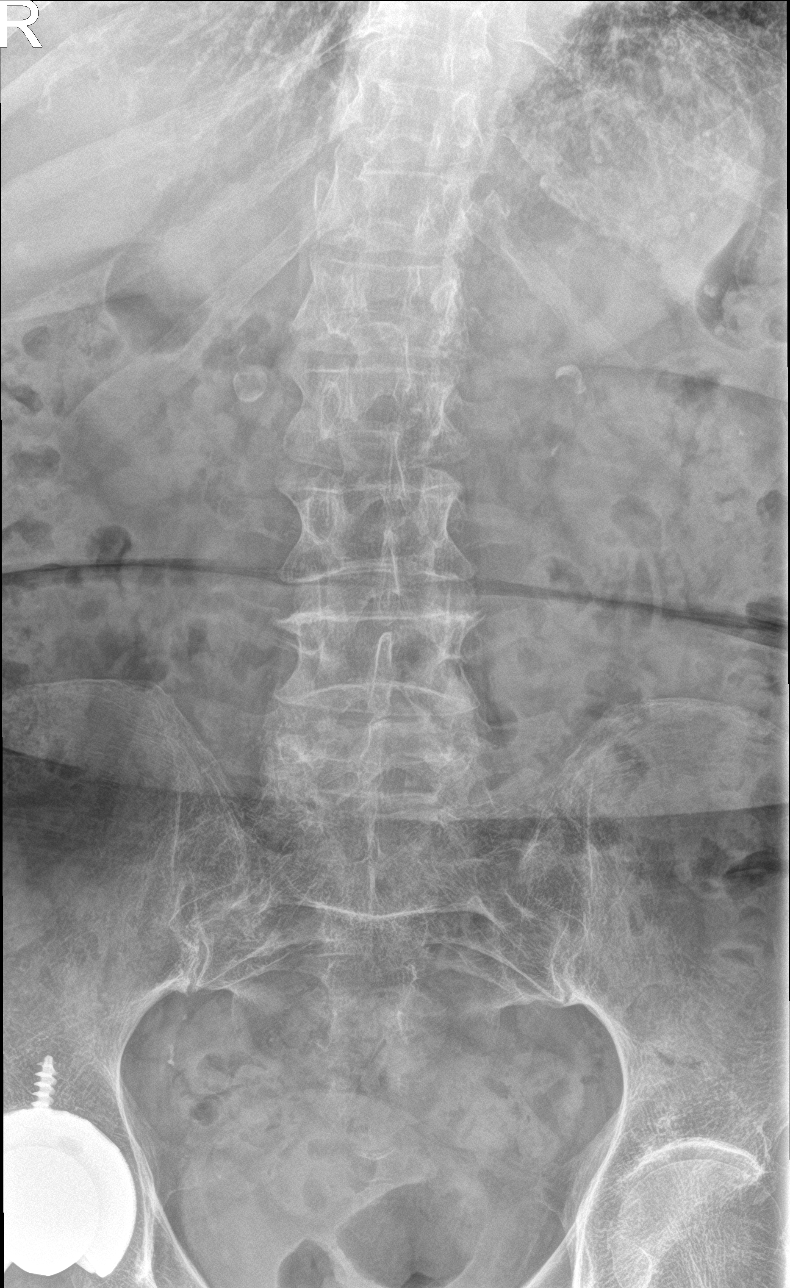

[l-spine obl (1 of 2)]
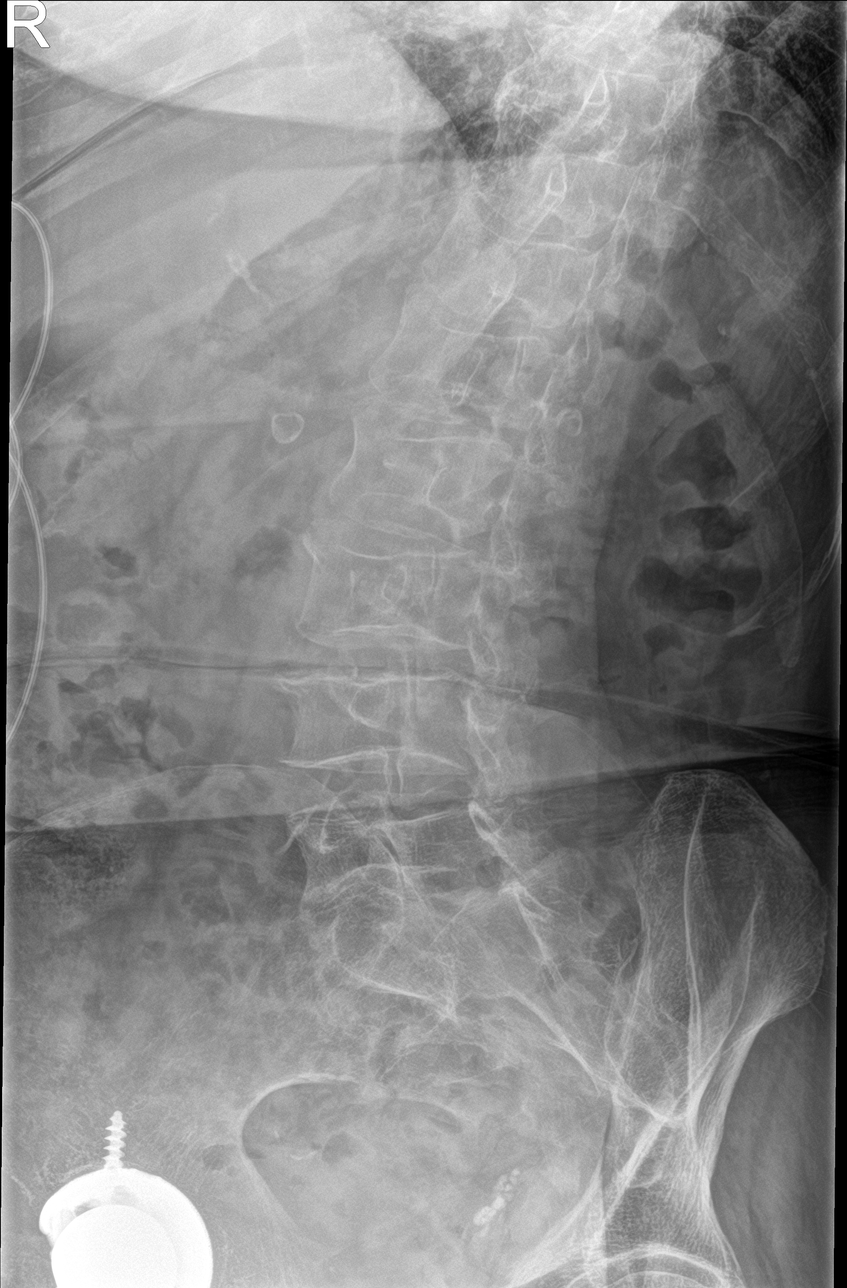

[l-spine obl (2 of 2)]
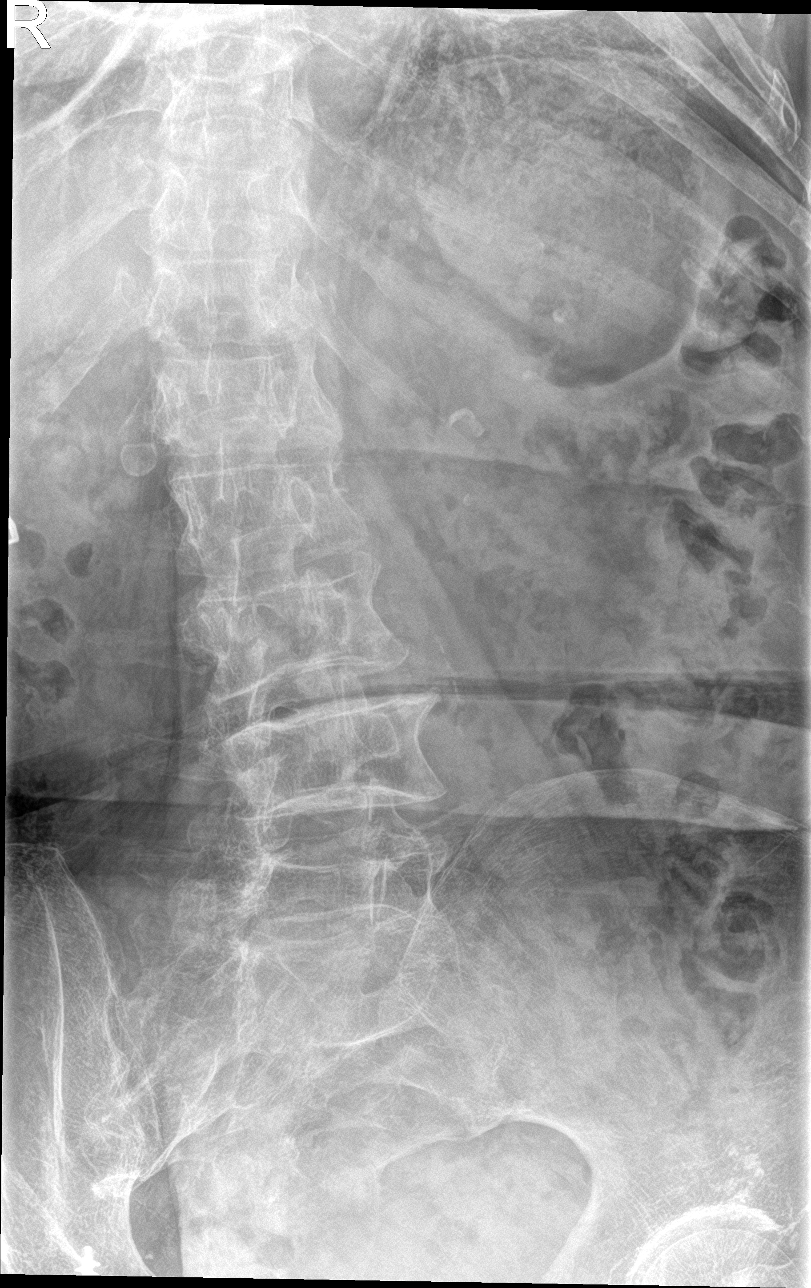

[l-spine lat]
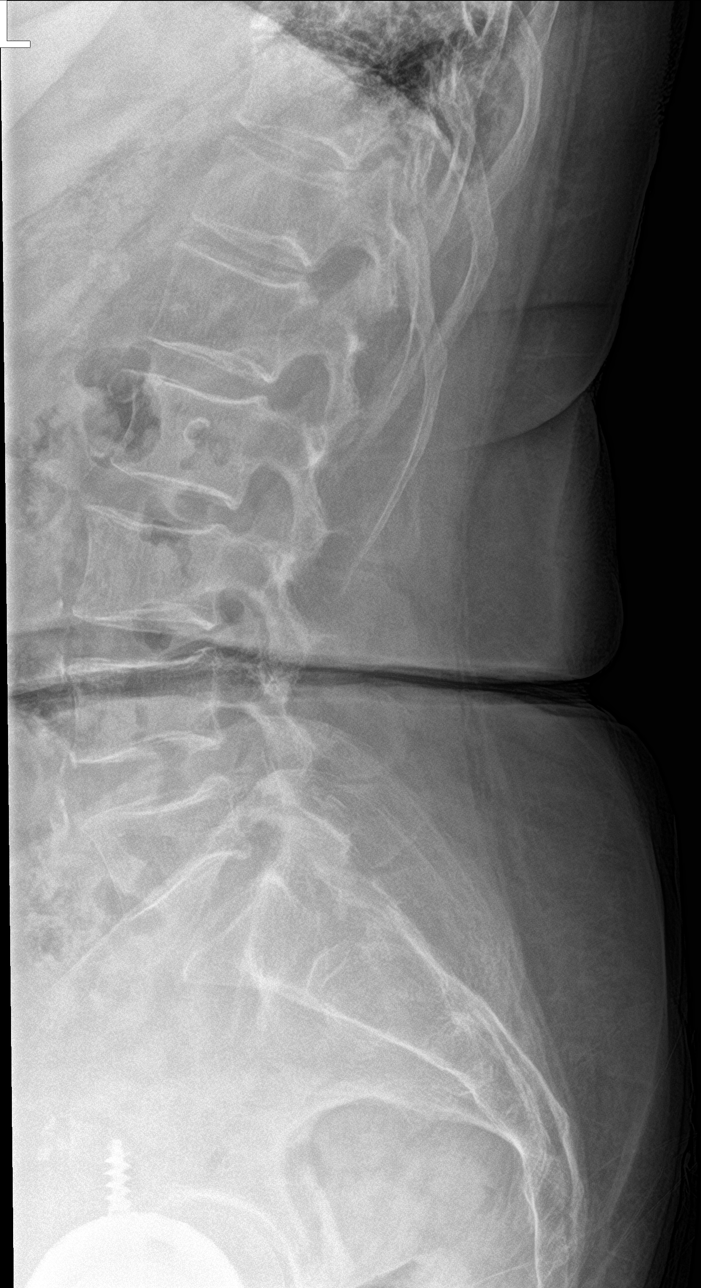

[l-spine spot]
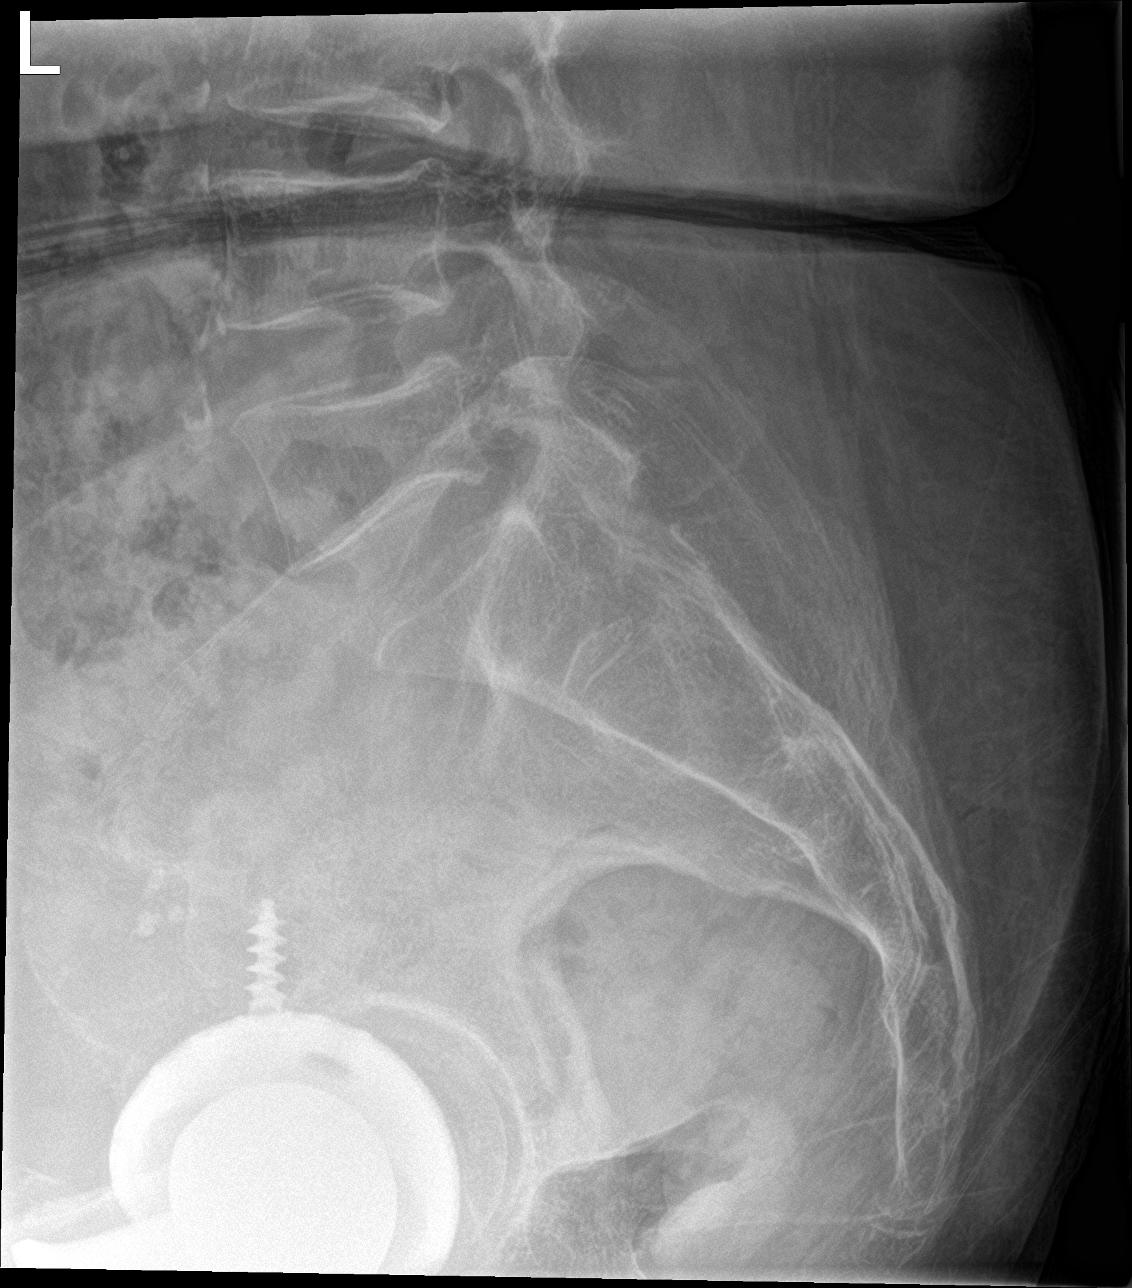

[5 of 5 positions shown; findings below may reference images not displayed]

FINDINGS: This report assumes 5 non rib-bearing lumbar vertebrae.

Lumbar vertebral body heights are stable from recent MRI, with no
fracture.

Mild multilevel lumbar degenerative disc disease, most prominent at
L1-2 and L3-4. No spondylolisthesis. No significant facet
arthropathy. No aggressive appearing focal osseous lesions.
Abdominal aortic atherosclerosis. Partially visualized right total
hip arthroplasty.
IMPRESSION: 1. No acute osseous abnormality.
2. Mild multilevel lumbar degenerative disc disease.

## 2020-01-02 ENCOUNTER — Emergency Department (HOSPITAL_COMMUNITY)
Admission: EM | Admit: 2020-01-02 | Discharge: 2020-01-03 | Disposition: A | Discharge status: Home / Self Care | Payer: Medicare Other | Attending: Emergency Medicine | Admitting: Emergency Medicine

## 2020-01-02 ENCOUNTER — Other Ambulatory Visit: Payer: Self-pay

## 2020-01-02 ENCOUNTER — Emergency Department (HOSPITAL_COMMUNITY): Payer: Medicare Other

## 2020-01-02 DIAGNOSIS — I700 Atherosclerosis of aorta: Secondary | ICD-10-CM | POA: Diagnosis not present

## 2020-01-02 DIAGNOSIS — K838 Other specified diseases of biliary tract: Secondary | ICD-10-CM | POA: Diagnosis not present

## 2020-01-02 DIAGNOSIS — J449 Chronic obstructive pulmonary disease, unspecified: Secondary | ICD-10-CM | POA: Diagnosis not present

## 2020-01-02 DIAGNOSIS — R1011 Right upper quadrant pain: Secondary | ICD-10-CM | POA: Diagnosis not present

## 2020-01-02 DIAGNOSIS — I129 Hypertensive chronic kidney disease with stage 1 through stage 4 chronic kidney disease, or unspecified chronic kidney disease: Secondary | ICD-10-CM | POA: Insufficient documentation

## 2020-01-02 DIAGNOSIS — R0902 Hypoxemia: Secondary | ICD-10-CM | POA: Diagnosis not present

## 2020-01-02 DIAGNOSIS — R0789 Other chest pain: Secondary | ICD-10-CM | POA: Insufficient documentation

## 2020-01-02 DIAGNOSIS — N390 Urinary tract infection, site not specified: Secondary | ICD-10-CM | POA: Insufficient documentation

## 2020-01-02 DIAGNOSIS — Z8673 Personal history of transient ischemic attack (TIA), and cerebral infarction without residual deficits: Secondary | ICD-10-CM | POA: Diagnosis not present

## 2020-01-02 DIAGNOSIS — Z79899 Other long term (current) drug therapy: Secondary | ICD-10-CM | POA: Diagnosis not present

## 2020-01-02 DIAGNOSIS — Z743 Need for continuous supervision: Secondary | ICD-10-CM | POA: Diagnosis not present

## 2020-01-02 DIAGNOSIS — R52 Pain, unspecified: Secondary | ICD-10-CM | POA: Diagnosis not present

## 2020-01-02 DIAGNOSIS — N183 Chronic kidney disease, stage 3 unspecified: Secondary | ICD-10-CM | POA: Diagnosis not present

## 2020-01-02 DIAGNOSIS — R079 Chest pain, unspecified: Secondary | ICD-10-CM | POA: Diagnosis not present

## 2020-01-02 DIAGNOSIS — Z96641 Presence of right artificial hip joint: Secondary | ICD-10-CM | POA: Insufficient documentation

## 2020-01-02 DIAGNOSIS — J8410 Pulmonary fibrosis, unspecified: Secondary | ICD-10-CM | POA: Diagnosis not present

## 2020-01-02 DIAGNOSIS — J90 Pleural effusion, not elsewhere classified: Secondary | ICD-10-CM | POA: Diagnosis not present

## 2020-01-02 DIAGNOSIS — R6889 Other general symptoms and signs: Secondary | ICD-10-CM | POA: Diagnosis not present

## 2020-01-02 DIAGNOSIS — I10 Essential (primary) hypertension: Secondary | ICD-10-CM | POA: Diagnosis not present

## 2020-01-02 DIAGNOSIS — I2510 Atherosclerotic heart disease of native coronary artery without angina pectoris: Secondary | ICD-10-CM | POA: Diagnosis not present

## 2020-01-02 LAB — COMPREHENSIVE METABOLIC PANEL
ALT: 13 U/L (ref 0–44)
AST: 27 U/L (ref 15–41)
Albumin: 2.7 g/dL — ABNORMAL LOW (ref 3.5–5.0)
Alkaline Phosphatase: 97 U/L (ref 38–126)
Anion gap: 7 (ref 5–15)
BUN: 11 mg/dL (ref 8–23)
CO2: 29 mmol/L (ref 22–32)
Calcium: 9.3 mg/dL (ref 8.9–10.3)
Chloride: 99 mmol/L (ref 98–111)
Creatinine, Ser: 0.9 mg/dL (ref 0.44–1.00)
GFR calc Af Amer: >60 mL/min (ref >60)
GFR calc non Af Amer: >60 mL/min (ref >60)
Glucose, Bld: 85 mg/dL (ref 70–99)
Potassium: 3.4 mmol/L — ABNORMAL LOW (ref 3.5–5.1)
Sodium: 135 mmol/L (ref 135–145)
Total Bilirubin: 0.2 mg/dL — ABNORMAL LOW (ref 0.3–1.2)
Total Protein: 8 g/dL (ref 6.5–8.1)

## 2020-01-02 LAB — CBC WITH DIFFERENTIAL/PLATELET
Abs Immature Granulocytes: 0.02 10*3/uL (ref 0.00–0.07)
Basophils Absolute: 0.1 10*3/uL (ref 0.0–0.1)
Basophils Relative: 1 %
Eosinophils Absolute: 0.3 10*3/uL (ref 0.0–0.5)
Eosinophils Relative: 3 %
HCT: 34.9 % — ABNORMAL LOW (ref 36.0–46.0)
Hemoglobin: 10.5 g/dL — ABNORMAL LOW (ref 12.0–15.0)
Immature Granulocytes: 0 %
Lymphocytes Relative: 31 %
Lymphs Abs: 2.5 10*3/uL (ref 0.7–4.0)
MCH: 24.5 pg — ABNORMAL LOW (ref 26.0–34.0)
MCHC: 30.1 g/dL (ref 30.0–36.0)
MCV: 81.5 fL (ref 80.0–100.0)
Monocytes Absolute: 0.9 10*3/uL (ref 0.1–1.0)
Monocytes Relative: 11 %
Neutro Abs: 4.4 10*3/uL (ref 1.7–7.7)
Neutrophils Relative %: 54 %
Platelets: 342 10*3/uL (ref 150–400)
RBC: 4.28 MIL/uL (ref 3.87–5.11)
RDW: 18.5 % — ABNORMAL HIGH (ref 11.5–15.5)
WBC: 8.1 10*3/uL (ref 4.0–10.5)
nRBC: 0 % (ref 0.0–0.2)

## 2020-01-02 LAB — URINALYSIS, ROUTINE W REFLEX MICROSCOPIC
Bilirubin Urine: NEGATIVE
Glucose, UA: NEGATIVE mg/dL
Hgb urine dipstick: NEGATIVE
Ketones, ur: NEGATIVE mg/dL
Nitrite: POSITIVE — AB
Protein, ur: NEGATIVE mg/dL
Specific Gravity, Urine: 1.006 (ref 1.005–1.030)
pH: 6 (ref 5.0–8.0)

## 2020-01-02 LAB — LIPASE, BLOOD: Lipase: 20 U/L (ref 11–51)

## 2020-01-02 LAB — D-DIMER, QUANTITATIVE: D-Dimer, Quant: 3.37 ug/mL-FEU — ABNORMAL HIGH (ref 0.00–0.50)

## 2020-01-02 MED ORDER — FENTANYL CITRATE (PF) 100 MCG/2ML IJ SOLN
50.0000 ug | Freq: Once | INTRAMUSCULAR | Status: AC
  Administered 2020-01-02: 50 ug via INTRAVENOUS
  Filled 2020-01-02: qty 2

## 2020-01-02 MED ORDER — SODIUM CHLORIDE 0.9% FLUSH: 10.0000 mL | INTRAVENOUS | Status: DC | PRN

## 2020-01-02 MED ORDER — SODIUM CHLORIDE 0.9% FLUSH: 10.0000 mL | Freq: Two times a day (BID) | INTRAVENOUS | Status: DC

## 2020-01-02 MED ORDER — HYDROMORPHONE HCL 1 MG/ML IJ SOLN: 0.5000 mg | Freq: Once | INTRAMUSCULAR | Status: DC

## 2020-01-02 MED ORDER — MORPHINE SULFATE (PF) 4 MG/ML IV SOLN
4.0000 mg | Freq: Once | INTRAVENOUS | Status: AC
  Administered 2020-01-02: 4 mg via INTRAVENOUS
  Filled 2020-01-02: qty 1

## 2020-01-02 MED ORDER — LIDOCAINE 5 % EX PTCH
1.0000 | MEDICATED_PATCH | CUTANEOUS | Status: DC
  Administered 2020-01-02: 1 via TRANSDERMAL
  Filled 2020-01-02: qty 1

## 2020-01-02 NOTE — ED Notes (Signed)
Please call pt daughter Darin Engels at [phone] with an update

## 2020-01-02 NOTE — ED Provider Notes (Signed)
[facility] EMERGENCY DEPARTMENT Provider Note   CSN: [id number] Arrival date & time: 01/02/20  2122     History Chief Complaint  Patient presents with  . Abdominal Pain    [name] is a 79 y.o. female presenting for evaluation of right sided pain.  Patient states she was at home when she had acute onset right-sided pain.  It is mostly of her lower ribs/flank/right upper quadrant.  Pain is intermittent, comes in severe sharp spasms.  She has not taken anything for it including Tylenol or ibuprofen.  Any movement, palpation, or inspiration makes the pain worse.  She reports associated nausea, no vomiting.  She has fevers, chills, left-sided chest pain, cough, left-sided abdominal pain, urinary symptoms, abnormal bowel movements.   Additional history obtained from chart review.  Patient with a history of GERD, depression, RAD, hypertension, previous CVA, skin cancer, previous PE on blood thinners, degenerative disc disease, COPD, multiple abdominal surgeries.  HPI     Past Medical History:  Diagnosis Date  . Acute postoperative pain 02/03/2018  . Anemia   . B12 deficiency   . Bacteremia 10/07/2014  . Bladder incontinence   . Blind left eye   . Cancer ([place])    skin cancer   . Cataract   . Cervical spine fracture ([place])   . Chest pain 07/29/2013  . Compression fracture    C7,  upper T spine -compression fx  . COPD (chronic obstructive pulmonary disease) ([place])   . DDD (degenerative disc disease), lumbar   . Depression    major  . Diffuse myofascial pain syndrome 03/24/2015  . Dyspnea    at times- when activty  . Fever 10/05/2014  . GERD (gastroesophageal reflux disease)   . Hiatal hernia   . Hyperlipidemia   . Hypertension   . 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 Macular degeneration   . On home oxygen therapy    "3L; all the time" (04/30/2018)  . Osteoarthritis   . Osteoporosis   . Pneumonia    years ago  . Pulmonary embolism  ([place]) 04/30/2018  . Reactive airway disease   . Rupture of bowel ([place])   . Sepsis ([place]) 10/08/2014  . Stroke [facility])    "mini stroke"- balance and memory issue  . Stroke ([place])   . Wrist pain, acute 09/10/2012    Patient Active Problem List   Diagnosis Date Noted  . Pulmonary emphysema ([place]) 11/11/2019  . Acute cystitis 07/29/2019  . Non-intractable vomiting 05/19/2019  . Anemia 05/04/2019  . History of deep vein thrombosis of lower extremity 01/05/2019  . GI bleed 10/29/2018  . ILD (interstitial lung disease) ([place]) 08/12/2018  . COPD exacerbation ([place]) 05/17/2018  . Pressure injury of skin 05/04/2018  . PE (pulmonary thromboembolism) ([place]) 04/30/2018  . Acute metabolic encephalopathy   . Respiratory failure with hypoxia ([place]) 04/20/2018  . CAP (community acquired pneumonia) 04/20/2018  . Acute lower UTI 04/20/2018  . Chronic lower extremity pain (Right) 04/07/2018  . Abnormal MRI, lumbar spine 04/07/2018  . Lumbar facet hypertrophy (Multilevel) (Bilateral) 04/07/2018  . Lumbar foraminal stenosis (L3-4, L4-5) (Bilateral) 04/07/2018  . Annular tear of lumbar disc (L3-4, L4-5) 04/07/2018  . Closed hip fracture, sequela (Right) 02/03/2018  . It band syndrome, right 12/18/2017  . History of hip replacement (Right) 12/18/2017  . Ileus ([place]) 10/27/2017  . Primary osteoarthritis of hip 09/30/2017  . History of small bowel obstruction 09/10/2017  . Delayed union of closed fracture of hip,  right 09/10/2017  . SBO (small bowel obstruction) ([place]) 08/24/2017  . Abnormal x-ray of pelvis 08/13/2017  . Other specified dorsopathies, sacral and sacrococcygeal region 08/04/2017  . Spondylosis without myelopathy or radiculopathy, lumbosacral region 07/17/2017  . Non-traumatic compression fracture of T3 thoracic vertebra, sequela 07/02/2017  . High risk medication use 07/02/2017  . At high risk for falls 07/02/2017  . Chronic sacroiliac joint pain (Right) 07/02/2017  . CKD (chronic kidney  disease) stage 3, GFR 30-59 ml/min 04/23/2017  . Chronic knee pain s/p total knee replacement (TKR) (Right) 04/02/2017  . DDD (degenerative disc disease), lumbar 03/05/2017  . Chronic knee pain (Bilateral) (R>L) 03/05/2017  . Osteoarthritis 03/05/2017  . Chronic musculoskeletal pain 03/05/2017  . Neurogenic pain 03/05/2017  . Chronic myofascial pain 02/12/2017  . Lumbar facet syndrome (Bilateral) (L>R) 02/12/2017  . Long term prescription benzodiazepine use 02/12/2017  . Disorder of skeletal system 02/12/2017  . Pharmacologic therapy 02/12/2017  . Problems influencing health status 02/12/2017  . 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 NSAID long-term use 02/12/2017  . DDD (degenerative disc disease), cervical 11/11/2016  . Lumbar facet arthropathy (Bilateral) 11/11/2016  . 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 CAFL (chronic airflow limitation) ([place]) 11/07/2016  . Depression, major, in partial remission ([place]) 11/07/2016  . 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 Long term current use of opiate analgesic 11/07/2016  . Long term prescription opiate use 11/07/2016  . Chronic pain syndrome 11/07/2016  . Chronic neck pain (Secondary Area of Pain) (Bilateral) (L>R) 11/07/2016  . Chronic hip pain (Right) 11/07/2016  . Age-related osteoporosis with current pathological fracture with routine healing 09/20/2016  . History of stroke 09/20/2016  . Intertrochanteric fracture of femur, sequela (Right) 09/13/2016  . Chronic shoulder pain (Bilateral) 08/12/2016  . Hx of total knee replacement (Bilateral) 04/04/2016  . Chronic shoulder pain (Left) 02/28/2016  . Hoarseness 01/12/2016  . Pharyngoesophageal dysphagia 01/12/2016  . Chronic low back pain [facility] Area of Pain) (Bilateral) (L>R) 10/26/2015  . 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 Syncope, non cardiac 05/22/2015  . Thrombocytopenia ([place])  10/07/2014  . Hypoxia 10/05/2014  . Anxiety 06/15/2014  . Duodenogastric reflux 06/15/2014  . Benign essential HTN 06/15/2014  . Hypercholesteremia 07/30/2013  . HTN (hypertension) 07/30/2013  . GERD (gastroesophageal reflux disease) 07/30/2013  . Memory loss 05/03/2013  . Chronic knee pain (Primary Area of Pain) (Right) 09/10/2012    Past Surgical History:  Procedure Laterality Date  . ABDOMINAL HYSTERECTOMY    . ABDOMINAL SURGERY    . APPENDECTOMY    . BLADDER SURGERY    . CATARACT EXTRACTION W/ INTRAOCULAR LENS  IMPLANT, BILATERAL Bilateral   . CHOLECYSTECTOMY    . COLON SURGERY    . COLOSTOMY REVERSAL    . ESOPHAGOGASTRODUODENOSCOPY N/A 10/30/2018   Procedure: ESOPHAGOGASTRODUODENOSCOPY (EGD);  Surgeon: Lin Landsman, MD;  [facility] ENDOSCOPY;  Service: Gastroenterology;  Laterality: N/A;  . INTRAMEDULLARY (IM) NAIL INTERTROCHANTERIC Right 09/13/2016   Procedure: INTRAMEDULLARY (IM) NAIL INTERTROCHANTRIC RIGHT;  Surgeon: Leandrew Koyanagi, MD;  Location: [place];  Service: Orthopedics;  Laterality: Right;  . JOINT REPLACEMENT Bilateral   . JOINT REPLACEMENT    . KNEE SURGERY    . OSTOMY    . RECTOCELE REPAIR    . TOE SURGERY Right    3rd  . TOTAL HIP ARTHROPLASTY Right 09/30/2017   Procedure: RIGHT HIP IM NAIL REMOVAL WITH CONVERSION TO TOTAL HIP ARTHOPLASTY, anterior  approach;  Surgeon: Renette Butters, MD;  Location: [place];  Service: Orthopedics;  Laterality: Right;     OB History   No obstetric history on file.     Family History  Problem Relation Age of Onset  . Heart failure Mother   . Heart attack Father   . Lung cancer Sister   . Deep vein thrombosis Sister   . Pulmonary embolism Son   . Deep vein thrombosis Son   . Cervical cancer Daughter     Social History   Tobacco Use  . Smoking status: Never Smoker  . Smokeless tobacco: Never Used  Vaping Use  . Vaping Use: Former  Substance Use Topics  . Alcohol use: Not Currently  . Drug use: Not  Currently    Home Medications Prior to Admission medications   Medication Sig Start Date End Date Taking? Authorizing Provider  FLOVENT HFA 220 MCG/ACT inhaler TAKE 1 PUFF BY MOUTH TWICE A DAY 12/14/19   [name], FNP  acetaminophen (TYLENOL) 500 MG tablet Take 2 tablets (1,000 mg total) by mouth 3 (three) times daily. Patient taking differently: Take 1,000 mg by mouth 2 (two) times daily.  08/25/18   Ria Bush, MD  albuterol (ACCUNEB) 1.25 MG/3ML nebulizer solution Take 3 mLs (1.25 mg total) by nebulization every 6 (six) hours as needed for wheezing. 08/26/18   [name], FNP  albuterol (VENTOLIN HFA) 108 (90 Base) MCG/ACT inhaler Inhale 2 puffs into the lungs every 6 (six) hours as needed for wheezing or shortness of breath. 09/25/18   [name], FNP  alendronate (FOSAMAX) 70 MG tablet TAKE 1 TABLET BY MOUTH ONCE A WEEK 06/09/19   [name], FNP  ALPRAZolam Duanne Moron) 1 MG tablet TAKE 1 TABLET (1 MG TOTAL) BY MOUTH AT BEDTIME AS NEEDED FOR ANXIETY. 12/21/19   [name], FNP  amLODipine (NORVASC) 5 MG tablet TAKE 1 TABLET BY MOUTH EVERY DAY 08/30/19   [name], FNP  docusate sodium (COLACE) 100 MG capsule Take 200 mg by mouth 2 (two) times daily as needed for moderate constipation.     [provider]  DULoxetine (CYMBALTA) 20 MG capsule TAKE 2 CAPSULES BY MOUTH EVERY DAY 12/23/19   [name], FNP  ELIQUIS 2.5 MG TABS tablet TAKE 1 TABLET BY MOUTH TWICE A DAY 09/04/19   Earlie Server, MD  esomeprazole (NEXIUM) 40 MG capsule TAKE 1 CAPSULE BY MOUTH EVERY DAY 09/15/19   [name], FNP  fluticasone [facility]) 50 MCG/ACT nasal spray SPRAY 2 SPRAYS INTO EACH NOSTRIL EVERY DAY 08/25/19   [name], FNP  LINZESS 290 MCG CAPS capsule TAKE 1 CAPSULE BY MOUTH EVERY DAY 08/30/19   [name], FNP  lovastatin (MEVACOR) 10 MG tablet TAKE 1 TABLET BY MOUTH EVERY DAY 11/03/19   [name], FNP  NARCAN 4 MG/0.1ML LIQD  nasal spray kit  09/10/18   [provider]  ondansetron (ZOFRAN ODT) 4 MG disintegrating tablet Take 1 tablet (4 mg total) by mouth every 8 (eight) hours as needed for nausea or vomiting. 06/12/18   [name], FNP  Oxycodone HCl 10 MG TABS Take 10 mg by mouth 3 (three) times daily. 09/23/18   [provider]  polyethylene glycol (MIRALAX / GLYCOLAX) packet Take 17 g by mouth daily as needed (for constipation.).     [provider]  pregabalin (LYRICA) 100 MG capsule TAKE 1 CAPSULE BY MOUTH TWICE A DAY  06/11/19   [name], FNP  promethazine (PHENERGAN) 12.5 MG tablet TAKE 1 TABLET (12.5 MG TOTAL) BY MOUTH EVERY 12 (TWELVE) HOURS AS NEEDED FOR NAUSEA OR VOMITING. 12/30/19   [name], FNP  traZODone (DESYREL) 50 MG tablet TAKE 0.5-1.5 TABLETS (25-75 MG TOTAL) BY MOUTH AT BEDTIME AS NEEDED FOR SLEEP. 10/27/19   [name], FNP    Allergies    Ampicillin, Ampicillin-sulbactam sodium, Ambien [zolpidem tartrate], Cyclobenzaprine, Tape, Toradol [ketorolac tromethamine], Sulfamethoxazole-trimethoprim, Zolpidem, Cephalexin, and Tramadol  Review of Systems   Review of Systems  Cardiovascular: Positive for chest pain (R sided).  Gastrointestinal:       R sided upper gi/lower lung pain  All other systems reviewed and are negative.   Physical Exam Updated Vital Signs BP (!) 153/118   Pulse 96   Temp 98.2 F (36.8 C) (Oral)   Resp 20   Ht '5\' 5"'  (1.651 m)   Wt 73.9 kg   SpO2 93%   BMI 27.12 kg/m   Physical Exam Vitals and nursing note reviewed.  Constitutional:      General: She is not in acute distress.    Appearance: She is well-developed.     Comments: Appears uncomfortable due to pain.   HENT:     Head: Normocephalic and atraumatic.  Eyes:     Conjunctiva/sclera: Conjunctivae normal.     Pupils: Pupils are equal, round, and reactive to light.  Cardiovascular:     Rate and Rhythm: Normal rate and regular rhythm.     Pulses:  Normal pulses.  Pulmonary:     Effort: Pulmonary effort is normal. No respiratory distress.     Breath sounds: Normal breath sounds. No wheezing.     Comments: Clear lung sounds.  Pain with inspiration.  Speaking full sentences. Chest:     Chest wall: Tenderness present.       Comments: Tenderness palpation of right low chest/right upper quadrant abdomen.  No obvious deformity or signs of injury. Abdominal:     General: There is no distension.     Palpations: Abdomen is soft. There is no mass.     Tenderness: There is no guarding or rebound.  Musculoskeletal:        General: Normal range of motion.     Cervical back: Normal range of motion and neck supple.     Right lower leg: No edema.     Left lower leg: No edema.     Comments: No leg swelling  Skin:    General: Skin is warm and dry.     Capillary Refill: Capillary refill takes less than 2 seconds.  Neurological:     Mental Status: She is alert and oriented to person, place, and time.     ED Results / Procedures / Treatments   Labs (all labs ordered are listed, but only abnormal results are displayed) Labs Reviewed  CBC WITH DIFFERENTIAL/PLATELET - Abnormal; Notable for the following components:      Result Value   Hemoglobin 10.5 (*)    HCT 34.9 (*)    MCH 24.5 (*)    RDW 18.5 (*)    All other components within normal limits  COMPREHENSIVE METABOLIC PANEL - Abnormal; Notable for the following components:   Potassium 3.4 (*)    Albumin 2.7 (*)    Total Bilirubin 0.2 (*)    All other components within normal limits  URINALYSIS, ROUTINE W REFLEX MICROSCOPIC - Abnormal; Notable for the following components:  Nitrite POSITIVE (*)    Leukocytes,Ua MODERATE (*)    Bacteria, UA RARE (*)    All other components within normal limits  D-DIMER, QUANTITATIVE (NOT AT [facility]) - Abnormal; Notable for the following components:   D-Dimer, Quant 3.37 (*)    All other components within normal limits  LIPASE, BLOOD    EKG EKG  Interpretation  Date/Time:  Sunday January 02 2020 21:33:28 EDT Ventricular Rate:  92 PR Interval:    QRS Duration: 87 QT Interval:  335 QTC Calculation: 415 R Axis:   29 Text Interpretation: Sinus rhythm Borderline T abnormalities, inferior leads Confirmed by Virgel Manifold [phone]) on 01/02/2020 10:02:08 PM   Radiology DG Chest 2 View  Result Date: 01/02/2020 CLINICAL DATA:  Lower right chest pain. EXAM: CHEST - 2 VIEW COMPARISON:  July 15, 2019 FINDINGS: Mild, diffuse chronic appearing increased interstitial lung markings are noted. This is slightly more prominent within the bilateral lower lobes. There is no evidence of acute infiltrate, pleural effusion or pneumothorax. The cardiac silhouette is mildly enlarged. There is tortuosity of the descending thoracic aorta. The visualized skeletal structures are unremarkable. IMPRESSION: Chronic appearing increased interstitial lung markings without evidence of acute or active cardiopulmonary disease. Electronically Signed   By: Virgina Norfolk M.D.   On: 01/02/2020 22:18    Procedures Procedures (including critical care time)  Medications Ordered in ED Medications  lidocaine (LIDODERM) 5 % 1 patch (1 patch Transdermal Patch Applied 01/02/20 2229)  sodium chloride flush (NS) 0.9 % injection 10-40 mL (has no administration in time range)  sodium chloride flush (NS) 0.9 % injection 10-40 mL (has no administration in time range)  morphine 4 MG/ML injection 4 mg (4 mg Intravenous Given 01/02/20 2229)  fentaNYL (SUBLIMAZE) injection 50 mcg (50 mcg Intravenous Given 01/02/20 2327)    ED Course  I have reviewed the triage vital signs and the nursing notes.  Pertinent labs & imaging results that were available during my care of the patient were reviewed by me and considered in my medical decision making (see chart for details).    MDM Rules/Calculators/A&P                          Patient presented for evaluation of right-sided pain.  On exam,  patient peers nontoxic.  Pain began acutely, is worse with inspiration.  As such, consider PE, though patient is supposedly on blood thinner.  Also consider GI cause.  Consider kidney stone, though less likely as pain is more anterior.  Consider chronic pain.  Consider muscle spasm/msk cause.  Consider pneumonia, though less likely without fever or cough.  Will obtain labs, chest x-ray, EKG.  Chest x-ray viewed interpreted me, no pneumonia pneumothorax effusion, cardiomegaly.  EKG without ischemia.  Labs show elevated dimer at 3.3.  As such, will obtain a CTA of the chest. However consider pt still could have abd cause, will also order ct abd/pelvis.  Otherwise labs are reassuring.  Urine consistent with infection  Pt signed out to Conard Novak, PA-C for f/u on CT imaging. Plan for d/c if negative.   Final Clinical Impression(s) / ED Diagnoses Final diagnoses:  None    Rx / DC Orders ED Discharge Orders    None       Franchot Heidelberg, PA-C 01/03/20 0004    Virgel Manifold, MD 01/03/20 [phone]

## 2020-01-02 NOTE — ED Triage Notes (Signed)
Patient arrived EMS for intermittent abdominal pain RQU and right upper flank pain started 8am.   145/96 94HR 20RR 96% RA CBG 110

## 2020-01-02 NOTE — ED Provider Notes (Signed)
I assumed care of patient from previous team at shift change, please see their note for full H&P.  Briefly patient is here for evaluation of right sided flank/rib/upper abdominal pain since this evening. Physical Exam  BP (!) 153/118   Pulse 96   Temp 98.2 F (36.8 C) (Oral)   Resp 20   Ht 5\' 5"  (1.651 m)   Wt 73.9 kg   SpO2 93%   BMI 27.12 kg/m     ED Course/Procedures     Procedures DG Chest 2 View  Result Date: 01/02/2020 CLINICAL DATA:  Lower right chest pain. EXAM: CHEST - 2 VIEW COMPARISON:  July 15, 2019 FINDINGS: Mild, diffuse chronic appearing increased interstitial lung markings are noted. This is slightly more prominent within the bilateral lower lobes. There is no evidence of acute infiltrate, pleural effusion or pneumothorax. The cardiac silhouette is mildly enlarged. There is tortuosity of the descending thoracic aorta. The visualized skeletal structures are unremarkable. IMPRESSION: Chronic appearing increased interstitial lung markings without evidence of acute or active cardiopulmonary disease. Electronically Signed   By: Virgina Norfolk M.D.   On: 01/02/2020 22:18   CT Angio Chest PE W and/or Wo Contrast  Result Date: 01/03/2020 CLINICAL DATA:  79 year old female with chest and abdominal pain. Right upper quadrant and flank pain since 0800 hours. EXAM: CT ANGIOGRAPHY CHEST WITH CONTRAST TECHNIQUE: Multidetector CT imaging of the chest was performed using the standard protocol during bolus administration of intravenous contrast. Multiplanar CT image reconstructions and MIPs were obtained to evaluate the vascular anatomy. CONTRAST:  18mL OMNIPAQUE IOHEXOL 350 MG/ML SOLN COMPARISON:  CTA chest 05/17/2018. CT Abdomen and Pelvis today reported separately. FINDINGS: Cardiovascular: Excellent contrast bolus timing in the pulmonary arterial tree. No focal filling defect identified in the pulmonary arteries to suggest acute pulmonary embolism. Calcified coronary artery  atherosclerosis. Cardiac size remains within normal limits. No pericardial effusion. Negative visible aorta aside from atherosclerosis. Mediastinum/Nodes: Stable mediastinal lymph nodes since 2019, such as due to chronic or recurrent pulmonary inflammation. Lungs/Pleura: Lower lung volumes. Chronic pulmonary fibrosis. Major airways remain patent. New right lung pleural thickening or trace pleural effusion with superimposed rounded roughly 3 cm superior segment lower lobe opacity with some internal gas or cavitation (series 4, image 60), new since 2019. But stable lung opacity elsewhere. Upper Abdomen: Negative visible liver, spleen (calcified granulomas), pancreas, adrenal glands, kidneys and bowel in the upper abdomen. Musculoskeletal: Multilevel upper thoracic and lower cervical compression fractures appear stable. No acute osseous abnormality identified. Review of the MIP images confirms the above findings. IMPRESSION: 1. No evidence of acute pulmonary embolus. 2. Chronic Pulmonary Fibrosis with a new 3 cm rounded opacity in the superior segment right lower lobe since 2019. Superimposed new right lung pleural thickening or trace pleural fluid. This is indeterminate for pneumonia versus mass. Recommend one of the following: (a) repeat chest CT in 3 months, (b) follow-up PET-CT, or (c) tissue sampling. This recommendation adapted from the consensus statement: Guidelines for Management of Incidental Pulmonary Nodules Detected on CT Images: From the Fleischner Society 2017; Radiology 2017; 284:228-243. 3.  CT Abdomen, and Pelvis today are reported separately. 4. Calcified coronary artery atherosclerosis. Aortic Atherosclerosis (ICD10-I70.0). Electronically Signed   By: Genevie Ann M.D.   On: 01/03/2020 00:58   CT ABDOMEN PELVIS W CONTRAST  Result Date: 01/03/2020 CLINICAL DATA:  79 year old female with chest and abdominal pain. Right upper quadrant and flank pain since 0800 hours. EXAM: CT ABDOMEN AND PELVIS WITH  CONTRAST TECHNIQUE: Multidetector  CT imaging of the abdomen and pelvis was performed using the standard protocol following bolus administration of intravenous contrast. CONTRAST:  174mL OMNIPAQUE IOHEXOL 350 MG/ML SOLN COMPARISON:  CTA chest today reported separately. CT Abdomen and Pelvis 06/18/2019 FINDINGS: Lower chest: On these images there is evidence of both pleural thickening with enhancement and intervening trace pleural fluid in the posterior right hemithorax (series 5, image 2), new since January. Some of the rounded right lower lobe superior segment opacity is also visible on these images, see chest CTA for additional details. Hepatobiliary: Chronically absent gallbladder. Intra and extrahepatic biliary ductal dilatation with CBD measuring up to 15 mm diameter, although this appears similar to the January CT. However, the CBD has enlarged since 2019 (up to 12 mm at that time. No filling defect identified within the duct. Liver enhancement remains within normal limits. Pancreas: Stable, negative.  No pancreatic ductal enlargement. Spleen: Stable calcified splenic granulomas. Adrenals/Urinary Tract: Normal adrenal glands. Both kidneys appear stable since 2019 with symmetric renal enhancement and contrast excretion. Normal proximal ureters. 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 There is abundant gas within the urinary bladder on series 5, image 79. Mild bladder wall thickening appears stable. No perivesical stranding is evident. Stomach/Bowel: Redundant large bowel with mild retained stool throughout. Diverticulosis of the sigmoid colon with no active inflammation evident. Decompressed terminal ileum. Diminutive or absent appendix. No dilated small bowel. Decompressed stomach and duodenum. No free air, free fluid. Vascular/Lymphatic: Aortoiliac calcified atherosclerosis. Major arterial structures remain patent. Portal venous  system is patent. No lymphadenopathy. Reproductive: Diminutive or absent as before. Other: Streak artifact in the pelvis, no pelvis free fluid. Musculoskeletal: Osteopenia. Chronic right hip arthroplasty. No acute osseous abnormality identified. IMPRESSION: 1. On these images there is evidence of pleural thickening and trace pleural fluid in the right lower lobe, new since January this year, and near the abnormal superior segment opacity described on Chest CTA today. This would seem to favor infection over tumor. 2. Chronic but increased intra- and extrahepatic biliary ductal dilatation. No filling defect in the CBD. If there is hyperbilirubinemia then follow-up MRCP and/or ERCP may be valuable. 3. Gas within the urinary bladder, suspicious for UTI unless explained by recent catheterization. 4. Other chronic findings but no other acute or inflammatory process in the abdomen or pelvis. Electronically Signed   By: Genevie Ann M.D.   On: 01/03/2020 01:10   Labs Reviewed  CBC WITH DIFFERENTIAL/PLATELET - Abnormal; Notable for the following components:      Result Value   Hemoglobin 10.5 (*)    HCT 34.9 (*)    MCH 24.5 (*)    RDW 18.5 (*)    All other components within normal limits  COMPREHENSIVE METABOLIC PANEL - Abnormal; Notable for the following components:   Potassium 3.4 (*)    Albumin 2.7 (*)    Total Bilirubin 0.2 (*)    All other components within normal limits  URINALYSIS, ROUTINE W REFLEX MICROSCOPIC - Abnormal; Notable for the following components:   Nitrite POSITIVE (*)    Leukocytes,Ua MODERATE (*)    Bacteria, UA RARE (*)    All other components within normal limits  D-DIMER, QUANTITATIVE (NOT AT [facility]) - Abnormal; Notable for the following components:   D-Dimer, Quant 3.37 (*)    All other components within normal limits  LIPASE, BLOOD     MDM  Plan is to follow-up on  labs and CT scan.  CT scan show pleural thickening and trace pleural fluid in the right lower lobe along with gas  in the urinary bladder consistent with UTI.  There is CBD duct dilation however there is no significant abnormalities CMP.  We will treat patient for UTI.  Urine culture is ordered.  UA is also consistent with UTI with nitrite positive, 0-5 reds, 11-20 whites and rare bacteria.  Discussed with patient the importance of following up with primary care doctor and the multiple incidental findings including concern for a malignant cause of the pleural fluid.  She states her understanding.  Return precautions were discussed with patient who states their understanding.  At the time of discharge patient denied any unaddressed complaints or concerns.  Patient is agreeable for discharge home.  Note: Portions of this report may have been transcribed using voice recognition software. Every effort was made to ensure accuracy; however, inadvertent computerized transcription errors may be present         Lorin Glass, PA-C 01/03/20 [id number]    Ripley Fraise, MD 01/03/20 [phone]

## 2020-01-03 ENCOUNTER — Telehealth: Payer: Self-pay

## 2020-01-03 ENCOUNTER — Emergency Department (HOSPITAL_COMMUNITY): Payer: Medicare Other

## 2020-01-03 ENCOUNTER — Other Ambulatory Visit: Payer: Medicare Other | Admitting: Adult Health Nurse Practitioner

## 2020-01-03 DIAGNOSIS — R1011 Right upper quadrant pain: Secondary | ICD-10-CM | POA: Diagnosis not present

## 2020-01-03 DIAGNOSIS — K838 Other specified diseases of biliary tract: Secondary | ICD-10-CM | POA: Diagnosis not present

## 2020-01-03 DIAGNOSIS — I700 Atherosclerosis of aorta: Secondary | ICD-10-CM | POA: Diagnosis not present

## 2020-01-03 DIAGNOSIS — R000 Tachycardia, unspecified: Secondary | ICD-10-CM | POA: Diagnosis not present

## 2020-01-03 DIAGNOSIS — J8410 Pulmonary fibrosis, unspecified: Secondary | ICD-10-CM | POA: Diagnosis not present

## 2020-01-03 DIAGNOSIS — I2510 Atherosclerotic heart disease of native coronary artery without angina pectoris: Secondary | ICD-10-CM | POA: Diagnosis not present

## 2020-01-03 DIAGNOSIS — I10 Essential (primary) hypertension: Secondary | ICD-10-CM | POA: Diagnosis not present

## 2020-01-03 DIAGNOSIS — R0602 Shortness of breath: Secondary | ICD-10-CM | POA: Diagnosis not present

## 2020-01-03 DIAGNOSIS — J90 Pleural effusion, not elsewhere classified: Secondary | ICD-10-CM | POA: Diagnosis not present

## 2020-01-03 DIAGNOSIS — R05 Cough: Secondary | ICD-10-CM | POA: Diagnosis not present

## 2020-01-03 MED ORDER — CIPROFLOXACIN HCL 500 MG PO TABS
500.0000 mg | ORAL_TABLET | Freq: Two times a day (BID) | ORAL | 0 refills | Status: AC
Start: 2020-01-03 — End: 2020-01-10

## 2020-01-03 MED ORDER — IOHEXOL 350 MG/ML SOLN
100.0000 mL | Freq: Once | INTRAVENOUS | Status: AC | PRN
  Administered 2020-01-03: 100 mL via INTRAVENOUS

## 2020-01-03 MED ORDER — CIPROFLOXACIN HCL 500 MG PO TABS
500.0000 mg | ORAL_TABLET | Freq: Once | ORAL | Status: AC
  Administered 2020-01-03: 500 mg via ORAL
  Filled 2020-01-03: qty 1

## 2020-01-03 NOTE — Discharge Instructions (Addendum)
As we discussed the ct scan on your chest was abnormal and you need to follow up with your doctor as you need additional testing.   You may have diarrhea from the antibiotics.  It is very important that you continue to take the antibiotics even if you get diarrhea unless a medical professional tells you that you may stop taking them.  If you stop too early the bacteria you are being treated for will become stronger and you may need different, more powerful antibiotics that have more side effects and worsening diarrhea.  Please stay well hydrated and consider probiotics as they may decrease the severity of your diarrhea.   The antibiotic that you are getting has a slight risk of tendon and vascular issues but with your allergies it appears to be the best option.

## 2020-01-03 NOTE — Telephone Encounter (Signed)
Please call patient's daughter and see if she can bring her mother in on [place]. Or Friday? Can use a 15 minute slot and block additional 15 minute slot. I have reviewed the CT and CT angiogram results. There is nothing to explain her pain. There is a new finding on her lungs that needs follow up in 3 months. Unclear as to what it is, could be infection or tumor.

## 2020-01-03 NOTE — Telephone Encounter (Signed)
Pt has been scheduled for Wednesday

## 2020-01-03 NOTE — ED Provider Notes (Signed)
Patient seen/examined in the Emergency Department in conjunction with Advanced Practice Provider  Patient reports chest and abd pain Exam : awake/alert, no distress Plan: pt stable.  Multiple incidental findings.  Advised need for cancer workup due to abnormal CT chest.  Will also treat for UTI     Ripley Fraise, MD 01/03/20 [phone]

## 2020-01-03 NOTE — ED Notes (Signed)
Patient transported to CT 

## 2020-01-03 NOTE — Telephone Encounter (Signed)
Patient's daughter contacted the office. She states that patient is having right side pain - in her lung area. She states that they went to the ER last night, and mentioned that she lots of imaging done - but she does not understand the results and is not sure what her next steps should be. Debbie, the next available time we have for a hosp f/u would be next Monday - but she states patient is in extreme pain and does not want to go back to the ER. Debbie, any recommendations?

## 2020-01-05 ENCOUNTER — Ambulatory Visit (INDEPENDENT_AMBULATORY_CARE_PROVIDER_SITE_OTHER): Payer: Medicare Other | Admitting: Family Medicine

## 2020-01-05 ENCOUNTER — Encounter: Payer: Self-pay | Admitting: Family Medicine

## 2020-01-05 ENCOUNTER — Other Ambulatory Visit: Payer: Self-pay

## 2020-01-05 VITALS — BP 142/92 | HR 117 | Temp 97.6°F | Ht 64.0 in | Wt 152.0 lb

## 2020-01-05 DIAGNOSIS — R0781 Pleurodynia: Secondary | ICD-10-CM | POA: Diagnosis not present

## 2020-01-05 DIAGNOSIS — R630 Anorexia: Secondary | ICD-10-CM

## 2020-01-05 DIAGNOSIS — R062 Wheezing: Secondary | ICD-10-CM

## 2020-01-05 DIAGNOSIS — R9389 Abnormal findings on diagnostic imaging of other specified body structures: Secondary | ICD-10-CM | POA: Diagnosis not present

## 2020-01-05 DIAGNOSIS — F5104 Psychophysiologic insomnia: Secondary | ICD-10-CM | POA: Diagnosis not present

## 2020-01-05 MED ORDER — MIRTAZAPINE 7.5 MG PO TABS: 7.5000 mg | ORAL_TABLET | Freq: Every day | ORAL | 5 refills | Status: DC

## 2020-01-05 MED ORDER — PREDNISONE 20 MG PO TABS: 20.0000 mg | ORAL_TABLET | Freq: Every day | ORAL | 0 refills | Status: DC

## 2020-01-05 NOTE — Patient Instructions (Signed)
Good to see you today  I have sent in mirtazapine for you to take at bedtime, stop trazadone.   It should help with your appetite as well  Increase protein at meals, try Premier Protein

## 2020-01-05 NOTE — Progress Notes (Signed)
Subjective:    Patient ID: [name], female    DOB: 06-Jul-1940, 79 y.o.   MRN: [id number]  HPI Chief Complaint  Patient presents with  . Hospitalization Follow-up    Pt stated--right hip --still having sharp pain, bump especially when trying to get up and went to ER 01/02/20   This is a 79 yo female, accompanied by her daughter. She is having right sided rib (not hip) pain, was seen in ER. No findings to explain pain. Pain is stabbing, comes and goes, relieved with rest and heat. A little cough over last week. No falls/ known trauma.   Treated for UTI, on cipro. No dysuria, no frequency. Culture not obtained.   Poor sleep and appetite. She reports that her mood is up and down. Little relief with trazodone.   CT angiogram 01/03/20 showed new 3 cm rounded opacity in RLL. Recommended follow up CT in 3 months.   Review of Systems Per HPI    Objective:   Physical Exam Vitals reviewed.  Constitutional:      General: She is not in acute distress.    Appearance: Normal appearance. She is normal weight. She is not ill-appearing, toxic-appearing or diaphoretic.  HENT:     Head: Normocephalic and atraumatic.  Cardiovascular:     Rate and Rhythm: Normal rate and regular rhythm.     Heart sounds: Normal heart sounds.     Comments: HR on auscultation 86.  Pulmonary:     Effort: Pulmonary effort is normal. No respiratory distress.     Breath sounds: No stridor. Wheezing (few upper anterior expiratory) present. No rhonchi or rales.  Chest:     Chest wall: Tenderness (right lateral along rib border) present.  Musculoskeletal:     Right lower leg: No edema.     Left lower leg: No edema.     Comments: Ambulates at brisk pace using rolling walker.   Skin:    General: Skin is warm and dry.  Neurological:     Mental Status: She is alert and oriented to person, place, and time.  Psychiatric:        Mood and Affect: Mood normal.        Behavior: Behavior normal.        Thought Content:  Thought content normal.        Judgment: Judgment normal.          BP (!) 142/92   Pulse (!) 117   Temp 97.6 F (36.4 C)   Ht 5\' 4"  (1.626 m)   Wt 152 lb (68.9 kg)   SpO2 93%   BMI 26.09 kg/m  Wt Readings from Last 3 Encounters:  01/05/20 152 lb (68.9 kg)  01/02/20 163 lb (73.9 kg)  11/24/19 163 lb 12.8 oz (74.3 kg)     Assessment & Plan:  1. Rib pain -Limited in treatment options due to chronic Eliquis use.  Will do week of low-dose prednisone for inflammation. -Discussed using heat, topical treatments as well - predniSONE (DELTASONE) 20 MG tablet; Take 1 tablet (20 mg total) by mouth daily with breakfast.  Dispense: 7 tablet; Refill: 0  2. Psychophysiological insomnia -No improvement with trazodone, will stop and add low-dose mirtazapine to see if this will help with sleep and appetite - mirtazapine (REMERON) 7.5 MG tablet; Take 1 tablet (7.5 mg total) by mouth at bedtime.  Dispense: 30 tablet; Refill: 5  3. Decreased appetite - mirtazapine (REMERON) 7.5 MG tablet; Take 1 tablet (7.5 mg  total) by mouth at bedtime.  Dispense: 30 tablet; Refill: 5  4. Abnormal CT of the chest -We will need follow-up in 3 months, discussed findings with patient and daughter  5. Wheeze -Upper anterior airway with remainder of lungs clear.  She has not done her typical breathing treatments in the last couple of days.  Encouraged her to resume.  This visit occurred during the SARS-CoV-2 public health emergency.  Safety protocols were in place, including screening questions prior to the visit, additional usage of staff PPE, and extensive cleaning of exam room while observing appropriate contact time as indicated for disinfecting solutions.      Clarene Reamer, FNP-BC  [facility]  01/05/2020 11:11 AM

## 2020-01-06 ENCOUNTER — Other Ambulatory Visit: Payer: Self-pay | Admitting: Oncology

## 2020-01-07 ENCOUNTER — Ambulatory Visit: Payer: Medicare Other | Admitting: Family Medicine

## 2020-01-11 ENCOUNTER — Telehealth: Payer: Self-pay

## 2020-01-11 DIAGNOSIS — R55 Syncope and collapse: Secondary | ICD-10-CM | POA: Diagnosis not present

## 2020-01-11 DIAGNOSIS — G4489 Other headache syndrome: Secondary | ICD-10-CM | POA: Diagnosis not present

## 2020-01-11 DIAGNOSIS — R000 Tachycardia, unspecified: Secondary | ICD-10-CM | POA: Diagnosis not present

## 2020-01-11 NOTE — Telephone Encounter (Signed)
Please follow-up with patient to see if she has gone to the ER

## 2020-01-11 NOTE — Telephone Encounter (Signed)
Nicole Cruz RN with access nurse said pt refusing to go to ED for chest pain and fast heart rate. I spoke with [name] (DPR signed) pt is having fast heart beat; on lower rt of chest and upper rt side of abd having pain; BP 154/91 P 94. Heart rate has been up to 149 one day last wk. Pt had CP last wk. When pt is up P is usually between 108-149. Pt having new symptom of lightheaded all the time starting when got up this morning at 8 AM. Pt last vomited this morning at 4 AM. Pt feels like she is going to vomit now. I spoke with pt and she is going to have to think for a few mins if she is going to ED or not. Pt said she knows she is dehydrated,Cathy said that she can not take pt to UC. [name] will talk with pt and cb with decision of whether going to ED or not.

## 2020-01-11 NOTE — Telephone Encounter (Signed)
Appreciate the update 

## 2020-01-11 NOTE — Telephone Encounter (Signed)
Agree with plan for evaluation today

## 2020-01-11 NOTE — Telephone Encounter (Signed)
I spoke with [name]; pt is sitting to rest and waiting for BP and heart rate to go down. [name] is still trying to encourage pt to go to ED and she will cb with update. FYI to [name] FNP as PCP, Dr Einar Pheasant and Jerene Pitch CMA.

## 2020-01-11 NOTE — Telephone Encounter (Addendum)
Cathy called 911 pts heart rate went from 158 to 47. [name] said the EMS said there would be a 25 hr wait and pt is not going to ED and wants appt at [facility].  pts symptoms H/A pain level of 9, head spinning, SOB with any exertion, extra heart beat per EMS to pts daughter, P went from 158- 47. I spoke with Becky in back of [place] and was advised pt would be triaged, assessed, but there are no rooms in back and pt would be out front but pts with heart issues are presently being assessed periodically. I spoke with Patty at [facility] ED and similar situation there. I spoke with ED at [facility] and was advised no back rooms available now; 24 people waiting presently and there is a 7 hr wait at this time. [name] said pt had bad experience at [facility] and refuses to go there. I spoke with Nilda Simmer nurse at [facility] ED and she apologized but 4 - 7 PM there is diversion of EMS.  I tried calling [facility] ED [phone] x [phone] and got v/m and I did not leave message. [name] said her son is at the home now and he can get pt into the car and is going to take her to an UC. Not sure which UC they are going to but  at least pt will be evaluated. [name] wants to also schedule appt with [name] FNP on 01/12/20. I spoke with Estevan Oaks of [facility] and she said to advise [name] that we are very concerned about pts symptoms and pt needs to be seen today and if has to go to ED where pt will be monitored because if pt comes to [facility] would have pt transported to ED. [name] voiced understanding and she and her son will take pt now to UC. FYI to Dr Einar Pheasant and [name] FNP.

## 2020-01-11 NOTE — Telephone Encounter (Signed)
[place] Day - Client TELEPHONE ADVICE RECORD [facility] Patient Name: [name] Gender: Female DOB: 06/29/1940 Age: 79 Y 77 M 49 D Return Phone Number: [phone] (Primary), [phone] (Secondary) Address: [place] [address] Client [facility] - Client Client Site [place] - Day Physician [name]- NP Contact Type Call Who Is Calling Patient / Member / Family / Caregiver Call Type Triage / Clinical Caller Name CATHY BRADY Relationship To Patient Daughter Return Phone Number [phone] (Primary) Chief Complaint Heart palpitations or irregular heartbeat Reason for Call Symptomatic / Request for [place] states she is with the office. The patient has a heart rate of 105 and says her blood pressure is high but didn't give what it was. Translation No Nurse Assessment Nurse: Raphael Gibney, RN, Vanita Ingles Date/Time (Eastern Time): 01/11/2020 11:52:56 AM Confirm and document reason for call. If symptomatic, describe symptoms. ---Caller states mother has elevated pulse 105 and has been elevated for a week. has not checked BP. Has pain in her right side and is constant. not severe as it was. she is SOB with exertion. Has the patient had close contact with a person known or suspected to have the novel coronavirus illness OR traveled / lives in area with major community spread (including international travel) in the last 14 days from the onset of symptoms? * If Asymptomatic, screen for exposure and travel within the last 14 days. ---No Does the patient have any new or worsening symptoms? ---Yes Will a triage be completed? ---Yes Related visit to physician within the last 2 weeks? ---Yes Does the PT have any chronic conditions? (i.e. diabetes, asthma, this includes High risk factors for pregnancy, etc.) ---Yes List chronic conditions. ---HTN Is this a  behavioral health or substance abuse call? ---No Guidelines Guideline Title Affirmed Question Affirmed Notes Nurse Date/Time (Eastern Time) Heart Rate and Heartbeat Questions Dizziness, lightheadedness, or weakness Raphael Gibney, RN, Vanita Ingles 01/11/2020 11:55:05 AM PLEASE NOTE: All timestamps contained within this report are represented as [place] Standard Time. CONFIDENTIALTY NOTICE: This fax transmission is intended only for the addressee. It contains information that is legally privileged, confidential or otherwise protected from use or disclosure. If you are not the intended recipient, you are strictly prohibited from reviewing, disclosing, copying using or disseminating any of this information or taking any action in reliance on or regarding this information. If you have received this fax in error, please notify us immediately by telephone so that we can arrange for its return to [place]. Phone: [phone], Toll-Free: [phone], Fax: [phone] Page: 2 of 2 Call Id: [id number] [place]. Time [name] Time) Disposition Final User 01/11/2020 12:01:15 PM Go to ED Now Yes Raphael Gibney, RN, Doreatha Lew Disagree/Comply Disagree Caller Understands Yes PreDisposition Call Doctor Care Advice Given Per Guideline GO TO ED NOW: * You need to be seen in the Emergency Department. NOTE TO TRIAGER - DRIVING: * Another adult should drive. ANOTHER ADULT SHOULD DRIVE: * It is better and safer if another adult drives instead of you. CARE ADVICE given per Heart Rate and Heartbeat Questions (Adult) guideline. Comments User: Dannielle Burn, RN Date/Time [name] Time): 01/11/2020 12:01:12 PM called back line and spoke to [place] and gave rerport that pt has elevated heart rate. She is dizzy and lightheaded and SOB with exertion. Still having right sided abd pain that is not as severe as it was. triage outcome of go to ER now but pt does not want to go. Referrals  GO TO FACILITY REFUSED

## 2020-01-12 NOTE — Telephone Encounter (Signed)
Please call patient's daughter and see how she is doing? I do not see that she went to be evaluated yesterday.

## 2020-01-13 ENCOUNTER — Telehealth: Payer: Self-pay

## 2020-01-13 NOTE — Telephone Encounter (Signed)
Done.. lab/MD on 8/5 No Show appt was R/S. I was unable to reach her by phone. A reminder letter will be mailed out to make her aware of the 01/21/20 sched appt

## 2020-01-13 NOTE — Telephone Encounter (Signed)
I spoke with [name]; pt did not go to UC because they told pt they would send pt to ED. [name] said pt said "she is not going to hospital; if she is going to die she will die at home." pt wants [name] FNP to send her to pulmonary doctor but I explained that the pulmonary doctor cannot take care of H/A, dizziness, extra heart beat and fast to slow heart rate and that is why pt should go to ED. [name] said pt said that she is not dizzy now but is not going to ED.Marland Kitchen Sending note to [name] FNP and DR Einar Pheasant.

## 2020-01-13 NOTE — Telephone Encounter (Signed)
Noted. Glad to hear it seems she is doing slightly better  She can discuss pulmonary doctor at her visit with [name], FNP tomorrow

## 2020-01-13 NOTE — Telephone Encounter (Signed)
Pt No showed lab/MD on 8/5. Please reschedule appts.

## 2020-01-14 ENCOUNTER — Ambulatory Visit: Payer: Medicare Other | Admitting: Family Medicine

## 2020-01-14 DIAGNOSIS — G8929 Other chronic pain: Secondary | ICD-10-CM | POA: Diagnosis not present

## 2020-01-14 DIAGNOSIS — R202 Paresthesia of skin: Secondary | ICD-10-CM | POA: Diagnosis not present

## 2020-01-14 DIAGNOSIS — M25561 Pain in right knee: Secondary | ICD-10-CM | POA: Diagnosis not present

## 2020-01-14 DIAGNOSIS — Z79891 Long term (current) use of opiate analgesic: Secondary | ICD-10-CM | POA: Diagnosis not present

## 2020-01-14 DIAGNOSIS — M25551 Pain in right hip: Secondary | ICD-10-CM | POA: Diagnosis not present

## 2020-01-14 DIAGNOSIS — G894 Chronic pain syndrome: Secondary | ICD-10-CM | POA: Diagnosis not present

## 2020-01-14 NOTE — Telephone Encounter (Signed)
Noted  

## 2020-01-17 ENCOUNTER — Ambulatory Visit (INDEPENDENT_AMBULATORY_CARE_PROVIDER_SITE_OTHER)
Admission: RE | Admit: 2020-01-17 | Discharge: 2020-01-17 | Disposition: A | Discharge status: Home / Self Care | Payer: Medicare Other | Source: Ambulatory Visit | Attending: Family Medicine | Admitting: Family Medicine

## 2020-01-17 ENCOUNTER — Ambulatory Visit (INDEPENDENT_AMBULATORY_CARE_PROVIDER_SITE_OTHER): Payer: Medicare Other | Admitting: Family Medicine

## 2020-01-17 ENCOUNTER — Other Ambulatory Visit: Payer: Self-pay | Admitting: Family Medicine

## 2020-01-17 ENCOUNTER — Encounter: Payer: Self-pay | Admitting: Family Medicine

## 2020-01-17 ENCOUNTER — Other Ambulatory Visit: Payer: Self-pay

## 2020-01-17 VITALS — BP 110/72 | HR 123 | Temp 98.1°F | Ht 64.0 in | Wt 162.0 lb

## 2020-01-17 DIAGNOSIS — R0781 Pleurodynia: Secondary | ICD-10-CM

## 2020-01-17 DIAGNOSIS — R942 Abnormal results of pulmonary function studies: Secondary | ICD-10-CM | POA: Diagnosis not present

## 2020-01-17 DIAGNOSIS — R000 Tachycardia, unspecified: Secondary | ICD-10-CM

## 2020-01-17 DIAGNOSIS — M25551 Pain in right hip: Secondary | ICD-10-CM

## 2020-01-17 DIAGNOSIS — R911 Solitary pulmonary nodule: Secondary | ICD-10-CM

## 2020-01-17 MED ORDER — ALPRAZOLAM 1 MG PO TABS: 0.5000 mg | ORAL_TABLET | Freq: Two times a day (BID) | ORAL | 0 refills | Status: DC | PRN

## 2020-01-17 NOTE — Progress Notes (Signed)
Subjective:    Patient ID: [name], female    DOB: 06-17-1940, 79 y.o.   MRN: [id number]  HPI Chief Complaint  Patient presents with  . Chest Pain    Still right sided rib pain. High heart rate. No headaches at this time.   This is a 79 yo female, accompanied by her [name], who presents today with above cc. Was seen 01/05/2020 with pain in right rib cage. Was given prednisone. No improvement. Continues to be tender, pain with deep breath. Had been seen in ER 01/05/2020, had CT angio, CT abdomen/ pelvis. New 3 cm nodule noted right lower lobe.   Palpitations/ bradycardia/ tachycardia- heart rate by pulse ox all over. Was in 41s last week with a presyncopal episode. Ordered echo last month, still awaiting test. Has been very upset recently due to death of several of her dogs/   Right hip pain- history of right hip revision and prior dislocation. Several days ago felt acute pain with bending forward. Since then, sore, right leg seems shorter. Able to bear weight.    Review of Systems Per HPI    Objective:   Physical Exam Vitals reviewed.  Constitutional:      General: She is not in acute distress.    Appearance: She is well-developed and normal weight. She is not ill-appearing, toxic-appearing or diaphoretic.  HENT:     Head: Normocephalic and atraumatic.  Eyes:     Conjunctiva/sclera: Conjunctivae normal.  Cardiovascular:     Rate and Rhythm: Tachycardia present.     Heart sounds: Normal heart sounds.     Comments: Initially with elevated HR > 100, then decreased with sitting.  Pulmonary:     Effort: Pulmonary effort is normal.     Breath sounds: Normal breath sounds.  Chest:     Chest wall: Tenderness (right anterior/ lateral/ posterior) present.  Musculoskeletal:     Right lower leg: No edema.     Left lower leg: No edema.     Comments: Pain with right hip flexion, ROM at baseline. When supine, right leg shorter than left. Unsure if this is baseline. She is  able to ambulate with assistance to the exam table and use step to get up.   Skin:    General: Skin is warm and dry.  Neurological:     Mental Status: She is alert.      BP 110/72   Pulse (!) 123   Temp 98.1 F (36.7 C) (Temporal)   Ht 5\' 4"  (1.626 m)   Wt 162 lb (73.5 kg)   SpO2 95%   BMI 27.81 kg/m  Wt Readings from Last 3 Encounters:  01/17/20 162 lb (73.5 kg)  01/05/20 152 lb (68.9 kg)  01/02/20 163 lb (73.9 kg)   EKG- rate 82, sinus rhythm with 2 PACs, PR 150, QT 340, no change from prior tracings     Assessment & Plan:  1. Tachycardia - she has had intermittent bradycardia, tachycardia, unsure if related to activity, deconditioning.  - will get holter monitor but will get PET scan for lung nodule follow up first. - EKG 12-Lead  2. Rib pain on right side - unclear etiology, no known trauma, very tender for several weeks, discussed pain control, heat/ topical treatments, will get rib films  3. Right hip pain - has had dislocation of hip in past, will check xray - DG Hip Unilat W OR W/O Pelvis 2-3 Views Right; Future  4. Lung nodule seen on  imaging study - follow up with pulmonary - NM PET Image Initial (PI) Skull Base To Thigh; Future  This visit occurred during the SARS-CoV-2 public health emergency.  Safety protocols were in place, including screening questions prior to the visit, additional usage of staff PPE, and extensive cleaning of exam room while observing appropriate contact time as indicated for disinfecting solutions.    Clarene Reamer, FNP-BC  [facility]  01/18/2020 12:41 PM

## 2020-01-17 NOTE — Patient Instructions (Signed)
I will notify you of results  You will get a call to schedule PET scan and holter monitor

## 2020-01-18 ENCOUNTER — Encounter: Payer: Self-pay | Admitting: Family Medicine

## 2020-01-21 ENCOUNTER — Inpatient Hospital Stay: Payer: Medicare Other

## 2020-01-21 ENCOUNTER — Inpatient Hospital Stay: Payer: Medicare Other | Admitting: Oncology

## 2020-01-23 IMAGING — DX DG CHEST 2V
2 series · 2 of 2 positions shown · non-contrast
Comparison: 11/19/2016

CLINICAL DATA: Chest pain, shortness of Breath

EXAM:
CHEST - 2 VIEW

[x chest ap]
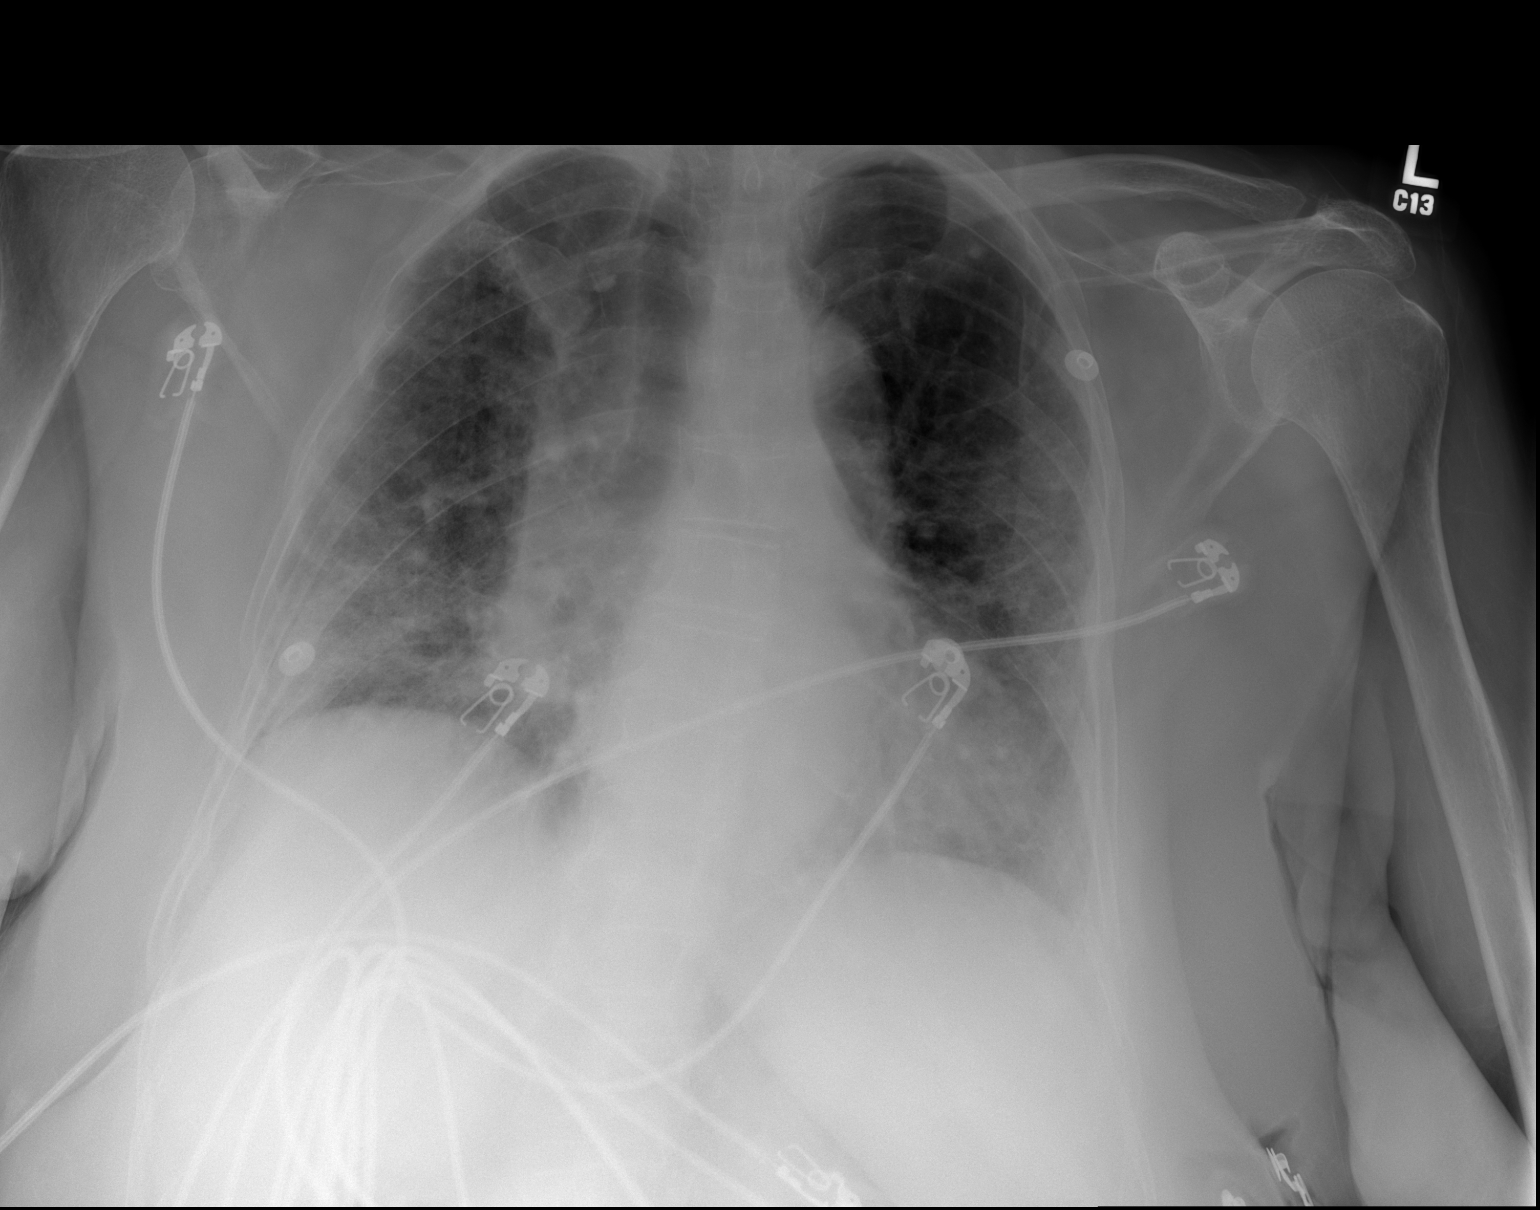

[w chest lat]
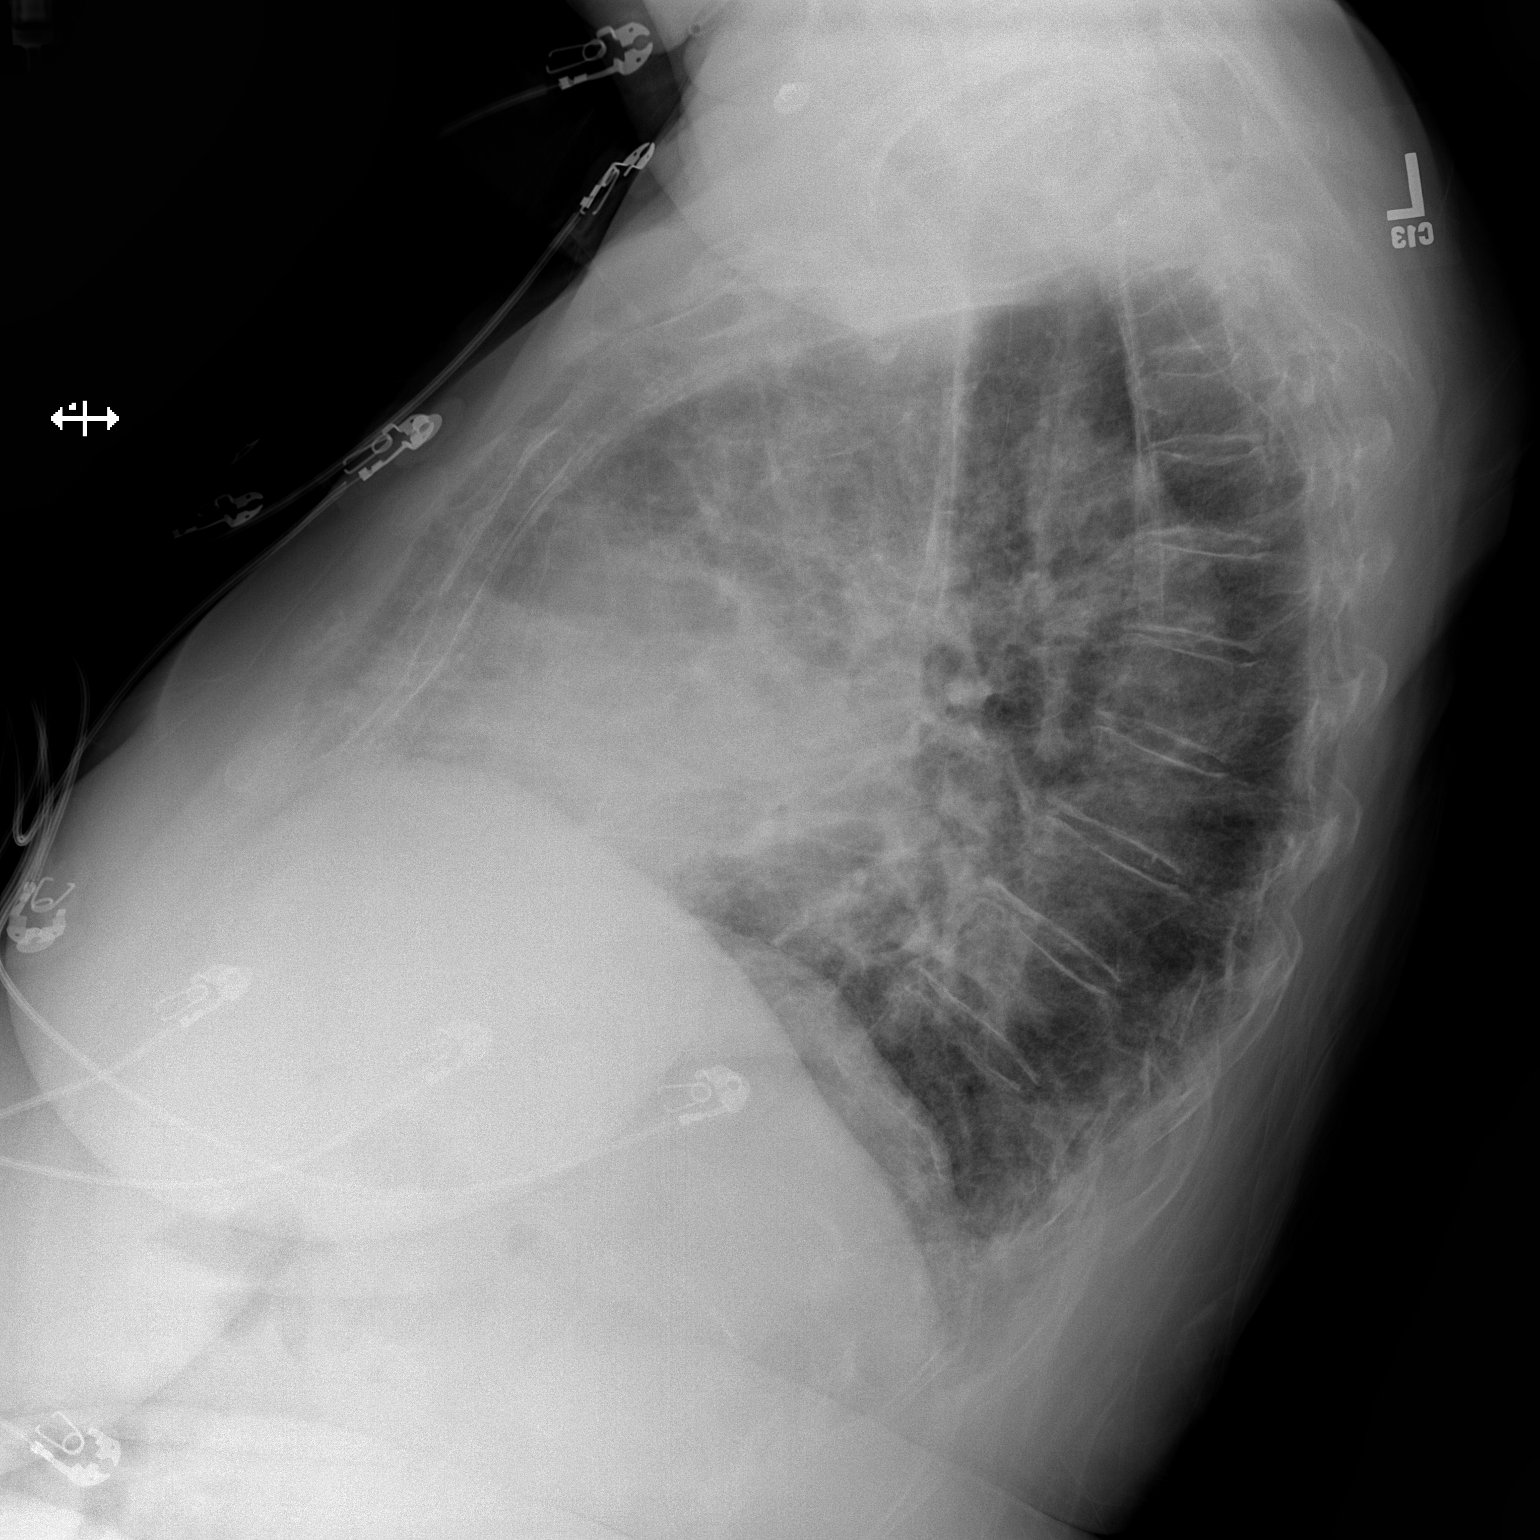

[2 of 2 positions shown; findings below may reference images not displayed]

FINDINGS: Cardiomegaly. Patchy bilateral airspace opacities, right greater
than left. This is most confluent in the right lung base. Cannot
exclude pneumonia. No effusions or acute bony abnormality.
IMPRESSION: Cardiomegaly. Patchy bilateral opacities, most pronounced in the
right lower lobe concerning for pneumonia.

## 2020-01-25 ENCOUNTER — Ambulatory Visit
Admission: RE | Admit: 2020-01-25 | Discharge: 2020-01-25 | Disposition: A | Discharge status: Home / Self Care | Payer: Medicare Other | Source: Ambulatory Visit | Attending: Family Medicine | Admitting: Family Medicine

## 2020-01-25 ENCOUNTER — Other Ambulatory Visit: Payer: Self-pay

## 2020-01-25 DIAGNOSIS — J432 Centrilobular emphysema: Secondary | ICD-10-CM | POA: Diagnosis not present

## 2020-01-25 DIAGNOSIS — J849 Interstitial pulmonary disease, unspecified: Secondary | ICD-10-CM | POA: Diagnosis not present

## 2020-01-25 DIAGNOSIS — I2510 Atherosclerotic heart disease of native coronary artery without angina pectoris: Secondary | ICD-10-CM | POA: Insufficient documentation

## 2020-01-25 DIAGNOSIS — I6523 Occlusion and stenosis of bilateral carotid arteries: Secondary | ICD-10-CM | POA: Insufficient documentation

## 2020-01-25 DIAGNOSIS — R911 Solitary pulmonary nodule: Secondary | ICD-10-CM | POA: Insufficient documentation

## 2020-01-25 DIAGNOSIS — Z96641 Presence of right artificial hip joint: Secondary | ICD-10-CM | POA: Diagnosis not present

## 2020-01-25 DIAGNOSIS — I700 Atherosclerosis of aorta: Secondary | ICD-10-CM | POA: Insufficient documentation

## 2020-01-25 DIAGNOSIS — J984 Other disorders of lung: Secondary | ICD-10-CM | POA: Diagnosis not present

## 2020-01-25 LAB — GLUCOSE, CAPILLARY: Glucose-Capillary: 89 mg/dL (ref 70–99)

## 2020-01-25 IMAGING — DX DG CHEST 1V PORT
1 series · 1 of 1 positions shown · non-contrast
Comparison: April 20, 2018

CLINICAL DATA: Cough and shortness of breath with hypoxia

EXAM:
PORTABLE CHEST 1 VIEW

[chest]
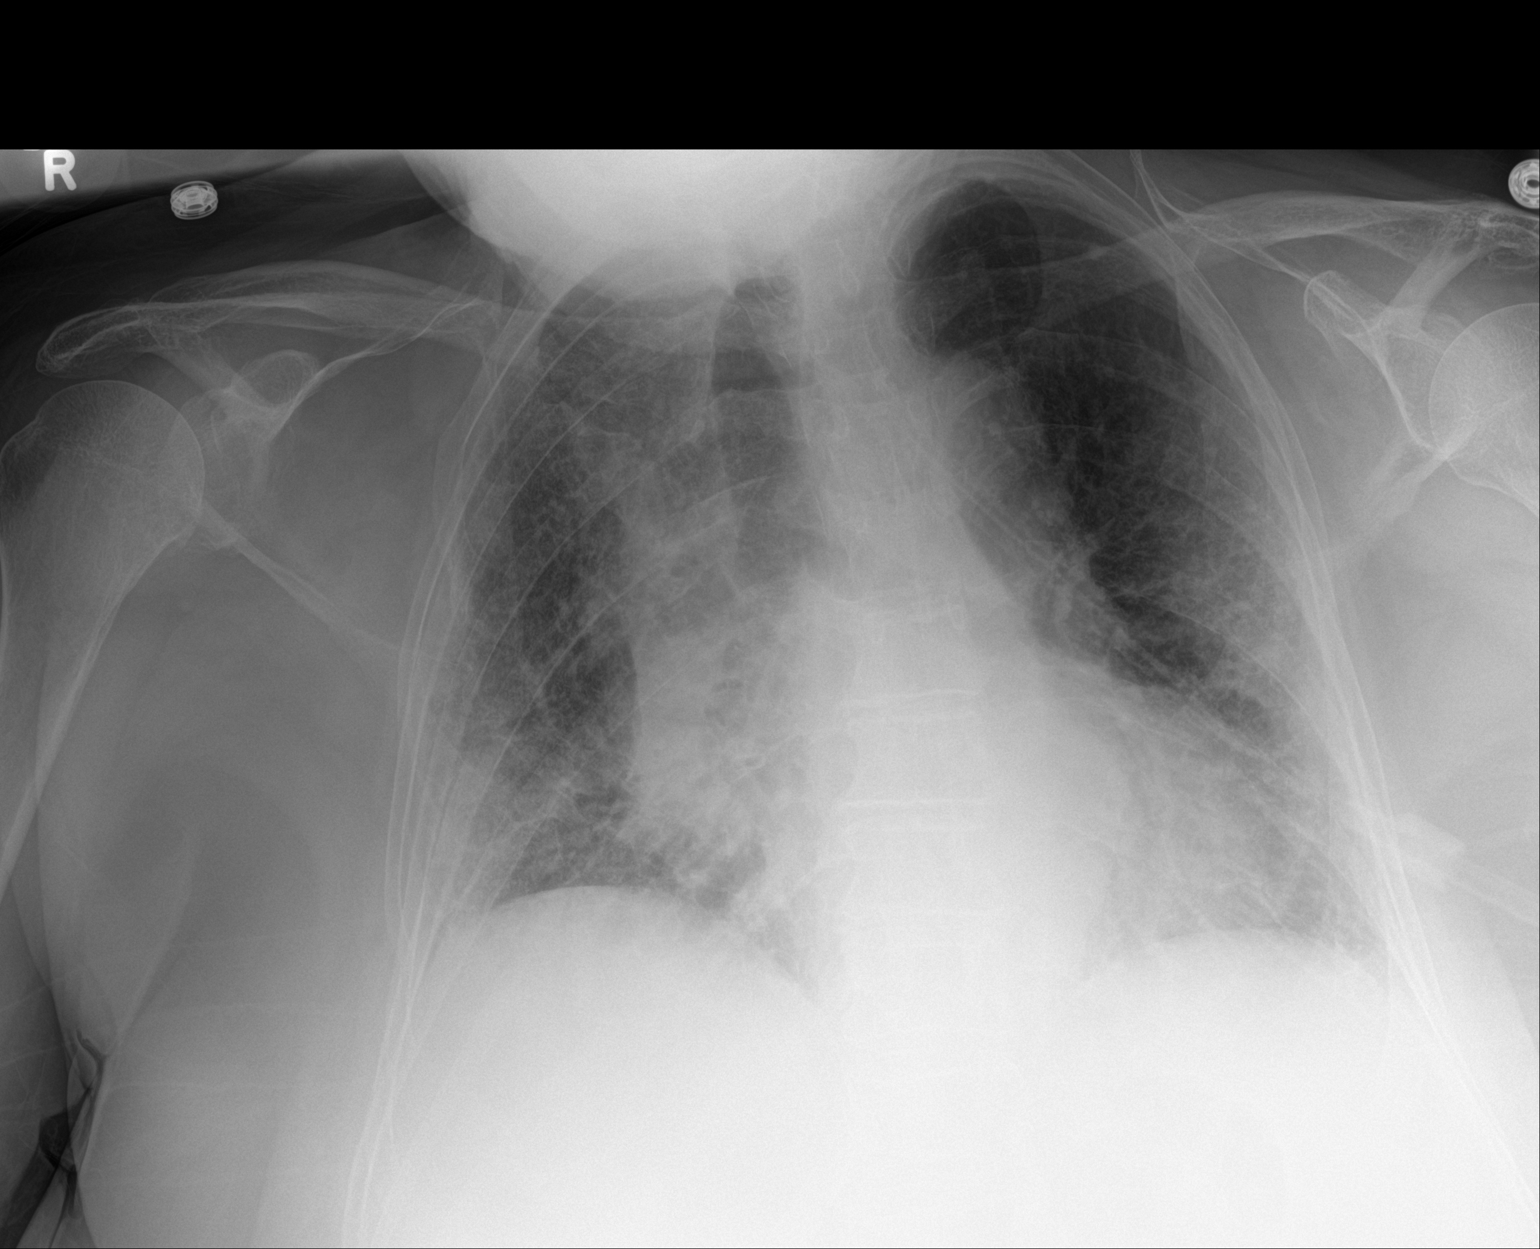

[1 of 1 positions shown; findings below may reference images not displayed]

FINDINGS: Patchy infiltrate is seen bilaterally, most notably in the right
lower lung zone medially. Heart is upper normal in size with
pulmonary vascularity normal. No adenopathy. No bone lesions.
IMPRESSION: Patchy airspace opacity, likely multifocal pneumonia. There may be a
degree of underlying fibrosis as well. Stable cardiac silhouette. No
new opacity.

## 2020-01-25 MED ORDER — FLUDEOXYGLUCOSE F - 18 (FDG) INJECTION
8.4000 | Freq: Once | INTRAVENOUS | Status: AC | PRN
  Administered 2020-01-25: 8.95 via INTRAVENOUS

## 2020-01-26 NOTE — Addendum Note (Signed)
Addended by: Clarene Reamer B on: 01/26/2020 01:59 PM   Modules accepted: Orders

## 2020-01-27 ENCOUNTER — Other Ambulatory Visit: Payer: Self-pay | Admitting: Family Medicine

## 2020-01-27 DIAGNOSIS — R630 Anorexia: Secondary | ICD-10-CM

## 2020-01-27 DIAGNOSIS — F5104 Psychophysiologic insomnia: Secondary | ICD-10-CM

## 2020-01-28 ENCOUNTER — Inpatient Hospital Stay: Payer: Medicare Other | Admitting: Oncology

## 2020-01-28 ENCOUNTER — Encounter: Payer: Self-pay | Admitting: Family Medicine

## 2020-01-28 ENCOUNTER — Telehealth: Payer: Self-pay

## 2020-01-28 ENCOUNTER — Inpatient Hospital Stay: Payer: Medicare Other

## 2020-01-28 NOTE — Telephone Encounter (Signed)
[facility] - Client TELEPHONE ADVICE RECORD [facility] Patient Name: [name] Gender: Female DOB: 1941/03/18 Age: 79 Y 50 M 25 D Return Phone Number: [phone] (Primary), [phone] (Secondary) Address[place] [address] Client [facility] - Client Client Site [place] - Day Physician [name]- NP Contact Type Call Who Is Calling Patient / Member / Family / Caregiver Call Type Triage / Clinical Caller Name Juliann Pulse Relationship To Patient Daughter Return Phone Number [phone] (Primary) Chief Complaint CHEST PAIN (>=21 years) - pain, pressure, heaviness or tightness Reason for Call Symptomatic / Request for [place] states they have extreme right lower lung pain. [place] Not Listed [facility] No Nurse Assessment Nurse: Kathi Ludwig, RN, Leana Roe Date/Time (Eastern Time): 01/28/2020 12:32:11 PM Confirm and document reason for call. If symptomatic, describe symptoms. ---Caller states right lung pain on her side. sees pain management for leg and hips, pain meds not helping. no fever. occ cough, COPD. Pain started 3-4 weeks ago, was seen in ER and at office. Lung CA. referral to oncologist Has the patient had close contact with a person known or suspected to have the novel coronavirus illness OR traveled / lives in area with major community spread (including international travel) in the last 14 days from the onset of symptoms? * If Asymptomatic, screen for exposure and travel within the last 14 days. ---No Does the patient have any new or worsening symptoms? ---Yes Will a triage be completed? ---Yes Related visit to physician within the last 2 weeks? ---Yes Does the PT have any chronic conditions? (i.e. diabetes, asthma, this includes High risk factors for pregnancy, etc.) ---Yes List chronic conditions. ---Lung CA, HTN, COPD,  PE and DVT (blood thinners) Is this a behavioral health or substance abuse call? ---No Guidelines Guideline Title Affirmed Question Affirmed Notes Nurse Date/Time [name] Time) Chest Pain SEVERE chest pain Kathi Ludwig, RN, Leana Roe 01/28/2020 12:34:58 PM PLEASE NOTE: All timestamps contained within this report are represented as [place] Standard Time. CONFIDENTIALTY NOTICE: This fax transmission is intended only for the addressee. It contains information that is legally privileged, confidential or otherwise protected from use or disclosure. If you are not the intended recipient, you are strictly prohibited from reviewing, disclosing, copying using or disseminating any of this information or taking any action in reliance on or regarding this information. If you have received this fax in error, please notify us immediately by telephone so that we can arrange for its return to [place]. Phone: [phone], Toll-Free: [phone], Fax: [phone] Page: 2 of 2 Call Id: [id number] [place]. Time [name] Time) Disposition Final User 01/28/2020 12:30:11 PM Send to Urgent Queue Dory Larsen 01/28/2020 12:38:24 PM Go to ED Now Yes Kathi Ludwig, RN, Minerva Ends Disagree/Comply Comply Caller Understands Yes PreDisposition Did not know what to do Care Advice Given Per Guideline GO TO ED NOW: * You need to be seen in the Emergency Department. * Go to the ED at ___________ [place] now. Drive carefully. NOTE TO TRIAGER - DRIVING: * Another adult should drive. BRING MEDICINES: * Please bring a list of your current medicines when you go to the Emergency Department (ER). NOTHING BY MOUTH: * Do not eat or drink anything for now. CALL EMS IF: * Severe difficulty breathing occurs * Passes out or becomes too weak to stand * You become worse. CARE ADVICE given per Chest Pain (Adult) guideline. Comments User: Estevan Ryder, RN Date/Time [name] Time): 01/28/2020  12:35:22 PM oxygen 2L prn User: Estevan Ryder, RN  Date/Time [name] Time): 01/28/2020 12:37:18 PM Sat 90 HR 107 User: Estevan Ryder, RN Date/Time [name] Time): 01/28/2020 12:38:38 PM recent PET scan Referrals GO TO FACILITY OTHER - SPECIFY

## 2020-01-28 NOTE — Telephone Encounter (Signed)
I spoke with [name] (DPR signed);pt is still hurting in the rt lung area but when laying down the lung area does not hurt as bad. Pt is resting right now in bed.[name] has not heard from oncologist in [place] yet. And if pain worsens she said her mom will probably go back to ED. DYI to [name] FNP.

## 2020-01-28 NOTE — Telephone Encounter (Signed)
Sent patient message via mychart regarding pain control and referral.

## 2020-01-31 ENCOUNTER — Other Ambulatory Visit: Payer: Self-pay

## 2020-01-31 ENCOUNTER — Emergency Department: Payer: Medicare Other

## 2020-01-31 ENCOUNTER — Encounter: Payer: Self-pay | Admitting: Emergency Medicine

## 2020-01-31 ENCOUNTER — Emergency Department
Admission: EM | Admit: 2020-01-31 | Discharge: 2020-01-31 | Disposition: A | Discharge status: Home / Self Care | Payer: Medicare Other | Attending: Emergency Medicine | Admitting: Emergency Medicine

## 2020-01-31 DIAGNOSIS — R6889 Other general symptoms and signs: Secondary | ICD-10-CM | POA: Diagnosis not present

## 2020-01-31 DIAGNOSIS — R9431 Abnormal electrocardiogram [ECG] [EKG]: Secondary | ICD-10-CM | POA: Diagnosis not present

## 2020-01-31 DIAGNOSIS — I270 Primary pulmonary hypertension: Secondary | ICD-10-CM | POA: Diagnosis not present

## 2020-01-31 DIAGNOSIS — E876 Hypokalemia: Secondary | ICD-10-CM | POA: Diagnosis not present

## 2020-01-31 DIAGNOSIS — I129 Hypertensive chronic kidney disease with stage 1 through stage 4 chronic kidney disease, or unspecified chronic kidney disease: Secondary | ICD-10-CM | POA: Diagnosis not present

## 2020-01-31 DIAGNOSIS — Z79899 Other long term (current) drug therapy: Secondary | ICD-10-CM | POA: Insufficient documentation

## 2020-01-31 DIAGNOSIS — I499 Cardiac arrhythmia, unspecified: Secondary | ICD-10-CM | POA: Diagnosis not present

## 2020-01-31 DIAGNOSIS — N183 Chronic kidney disease, stage 3 unspecified: Secondary | ICD-10-CM | POA: Insufficient documentation

## 2020-01-31 DIAGNOSIS — Z85828 Personal history of other malignant neoplasm of skin: Secondary | ICD-10-CM | POA: Insufficient documentation

## 2020-01-31 DIAGNOSIS — Y929 Unspecified place or not applicable: Secondary | ICD-10-CM | POA: Diagnosis not present

## 2020-01-31 DIAGNOSIS — J449 Chronic obstructive pulmonary disease, unspecified: Secondary | ICD-10-CM | POA: Diagnosis not present

## 2020-01-31 DIAGNOSIS — S22010A Wedge compression fracture of first thoracic vertebra, initial encounter for closed fracture: Secondary | ICD-10-CM | POA: Diagnosis not present

## 2020-01-31 DIAGNOSIS — Y939 Activity, unspecified: Secondary | ICD-10-CM | POA: Diagnosis not present

## 2020-01-31 DIAGNOSIS — I10 Essential (primary) hypertension: Secondary | ICD-10-CM | POA: Diagnosis not present

## 2020-01-31 DIAGNOSIS — Y999 Unspecified external cause status: Secondary | ICD-10-CM | POA: Diagnosis not present

## 2020-01-31 DIAGNOSIS — X58XXXA Exposure to other specified factors, initial encounter: Secondary | ICD-10-CM | POA: Diagnosis not present

## 2020-01-31 DIAGNOSIS — Z20822 Contact with and (suspected) exposure to covid-19: Secondary | ICD-10-CM | POA: Insufficient documentation

## 2020-01-31 DIAGNOSIS — C3431 Malignant neoplasm of lower lobe, right bronchus or lung: Secondary | ICD-10-CM | POA: Diagnosis not present

## 2020-01-31 DIAGNOSIS — Z96641 Presence of right artificial hip joint: Secondary | ICD-10-CM | POA: Diagnosis not present

## 2020-01-31 DIAGNOSIS — S22030A Wedge compression fracture of third thoracic vertebra, initial encounter for closed fracture: Secondary | ICD-10-CM | POA: Diagnosis not present

## 2020-01-31 DIAGNOSIS — R0682 Tachypnea, not elsewhere classified: Secondary | ICD-10-CM | POA: Insufficient documentation

## 2020-01-31 DIAGNOSIS — R000 Tachycardia, unspecified: Secondary | ICD-10-CM | POA: Diagnosis not present

## 2020-01-31 DIAGNOSIS — Z743 Need for continuous supervision: Secondary | ICD-10-CM | POA: Diagnosis not present

## 2020-01-31 DIAGNOSIS — S22000A Wedge compression fracture of unspecified thoracic vertebra, initial encounter for closed fracture: Secondary | ICD-10-CM | POA: Diagnosis not present

## 2020-01-31 DIAGNOSIS — Z7901 Long term (current) use of anticoagulants: Secondary | ICD-10-CM | POA: Diagnosis not present

## 2020-01-31 DIAGNOSIS — M549 Dorsalgia, unspecified: Secondary | ICD-10-CM

## 2020-01-31 DIAGNOSIS — R079 Chest pain, unspecified: Secondary | ICD-10-CM | POA: Diagnosis not present

## 2020-01-31 DIAGNOSIS — S22080A Wedge compression fracture of T11-T12 vertebra, initial encounter for closed fracture: Secondary | ICD-10-CM | POA: Diagnosis not present

## 2020-01-31 DIAGNOSIS — C3490 Malignant neoplasm of unspecified part of unspecified bronchus or lung: Secondary | ICD-10-CM | POA: Diagnosis not present

## 2020-01-31 DIAGNOSIS — I700 Atherosclerosis of aorta: Secondary | ICD-10-CM | POA: Diagnosis not present

## 2020-01-31 DIAGNOSIS — S299XXA Unspecified injury of thorax, initial encounter: Secondary | ICD-10-CM | POA: Diagnosis present

## 2020-01-31 DIAGNOSIS — R52 Pain, unspecified: Secondary | ICD-10-CM | POA: Diagnosis not present

## 2020-01-31 LAB — CBC
HCT: 35.8 % — ABNORMAL LOW (ref 36.0–46.0)
Hemoglobin: 10.7 g/dL — ABNORMAL LOW (ref 12.0–15.0)
MCH: 24.8 pg — ABNORMAL LOW (ref 26.0–34.0)
MCHC: 29.9 g/dL — ABNORMAL LOW (ref 30.0–36.0)
MCV: 83.1 fL (ref 80.0–100.0)
Platelets: 354 10*3/uL (ref 150–400)
RBC: 4.31 MIL/uL (ref 3.87–5.11)
RDW: 18.7 % — ABNORMAL HIGH (ref 11.5–15.5)
WBC: 7.3 10*3/uL (ref 4.0–10.5)
nRBC: 0 % (ref 0.0–0.2)

## 2020-01-31 LAB — BASIC METABOLIC PANEL
Anion gap: 8 (ref 5–15)
BUN: 15 mg/dL (ref 8–23)
CO2: 30 mmol/L (ref 22–32)
Calcium: 9.3 mg/dL (ref 8.9–10.3)
Chloride: 102 mmol/L (ref 98–111)
Creatinine, Ser: 0.9 mg/dL (ref 0.44–1.00)
GFR calc Af Amer: >60 mL/min (ref >60)
GFR calc non Af Amer: >60 mL/min (ref >60)
Glucose, Bld: 110 mg/dL — ABNORMAL HIGH (ref 70–99)
Potassium: 2.9 mmol/L — ABNORMAL LOW (ref 3.5–5.1)
Sodium: 140 mmol/L (ref 135–145)

## 2020-01-31 LAB — HEPATIC FUNCTION PANEL
ALT: 24 U/L (ref 0–44)
AST: 72 U/L — ABNORMAL HIGH (ref 15–41)
Albumin: 2.9 g/dL — ABNORMAL LOW (ref 3.5–5.0)
Alkaline Phosphatase: 129 U/L — ABNORMAL HIGH (ref 38–126)
Bilirubin, Direct: <0.1 mg/dL (ref 0.0–0.2)
Total Bilirubin: 0.4 mg/dL (ref 0.3–1.2)
Total Protein: 7.6 g/dL (ref 6.5–8.1)

## 2020-01-31 LAB — PROCALCITONIN: Procalcitonin: <0.1 ng/mL

## 2020-01-31 LAB — BRAIN NATRIURETIC PEPTIDE: B Natriuretic Peptide: 85.6 pg/mL (ref 0.0–100.0)

## 2020-01-31 LAB — TROPONIN I (HIGH SENSITIVITY)
Troponin I (High Sensitivity): 7 ng/L (ref <18)
Troponin I (High Sensitivity): 7 ng/L (ref <18)

## 2020-01-31 LAB — SARS CORONAVIRUS 2 BY RT PCR (HOSPITAL ORDER, PERFORMED IN ~~LOC~~ HOSPITAL LAB): SARS Coronavirus 2: NEGATIVE

## 2020-01-31 LAB — MAGNESIUM: Magnesium: 1.9 mg/dL (ref 1.7–2.4)

## 2020-01-31 MED ORDER — IOHEXOL 350 MG/ML SOLN
75.0000 mL | Freq: Once | INTRAVENOUS | Status: AC | PRN
  Administered 2020-01-31: 75 mL via INTRAVENOUS

## 2020-01-31 MED ORDER — POTASSIUM CHLORIDE CRYS ER 20 MEQ PO TBCR
40.0000 meq | EXTENDED_RELEASE_TABLET | Freq: Once | ORAL | Status: AC
  Administered 2020-01-31: 40 meq via ORAL
  Filled 2020-01-31: qty 2

## 2020-01-31 MED ORDER — LACTATED RINGERS IV BOLUS
500.0000 mL | Freq: Once | INTRAVENOUS | Status: AC
  Administered 2020-01-31: 500 mL via INTRAVENOUS

## 2020-01-31 MED ORDER — OXYCODONE HCL 5 MG PO TABS
10.0000 mg | ORAL_TABLET | Freq: Three times a day (TID) | ORAL | Status: DC
  Administered 2020-01-31: 10 mg via ORAL
  Filled 2020-01-31: qty 2

## 2020-01-31 MED ORDER — ACETAMINOPHEN 500 MG PO TABS
1000.0000 mg | ORAL_TABLET | Freq: Once | ORAL | Status: AC
  Administered 2020-01-31: 1000 mg via ORAL
  Filled 2020-01-31: qty 2

## 2020-01-31 MED ORDER — LIDOCAINE 5 % EX PTCH: 1.0000 | MEDICATED_PATCH | CUTANEOUS | Status: DC

## 2020-01-31 MED ORDER — FENTANYL CITRATE (PF) 100 MCG/2ML IJ SOLN
50.0000 ug | Freq: Once | INTRAMUSCULAR | Status: AC
  Administered 2020-01-31: 50 ug via INTRAVENOUS
  Filled 2020-01-31: qty 2

## 2020-01-31 MED ORDER — PREGABALIN 50 MG PO CAPS: 100.0000 mg | ORAL_CAPSULE | Freq: Two times a day (BID) | ORAL | Status: DC

## 2020-01-31 NOTE — ED Notes (Signed)
E-signature not working at this time. Pt verbalized understanding of D/C instructions, prescriptions and follow up care with no further questions at this time. Pt in NAD and ambulatory at time of D/C.  

## 2020-01-31 NOTE — ED Triage Notes (Addendum)
Pt was recently dx with lung mass, supposed to see oncology tomorrow.  Having pain to right lung starting at 6 AM today.  No pain in chest.   Unlabored.  Took her pain meds she had at home but no relief.  VSS.  Unlabored at this time

## 2020-01-31 NOTE — ED Provider Notes (Signed)
[facility] Emergency Department Provider Note  ____________________________________________   First MD Initiated Contact with Patient 01/31/20 1503     (approximate)  I have reviewed the triage vital signs and the nursing notes.   HISTORY  Chief Complaint Back Pain   HPI [name] is a 79 y.o. female with a past medical history of chronic hypoxic respiratory failure on 2 L at baseline secondary to COPD with remote tobacco abuse history, anemia, DDD, depression, GERD, HTN, HDL, and remote PE still on very low-dose Eliquis 2.5 mg as well as known right lower lung mass currently being worked up most recent with PET scan concerning for bronchogenic carcinoma obtained on 8/31 who presents for assessment of acute on subacute right-sided chest pain.  Patient notes he has had some discomfort in this area for several weeks but feels it got acutely worse last night.  She has chronic shortness of breath with exertion at baseline but she feels her shortness of breath is also gotten worse since the pain began yesterday.  She denies any fevers, chills, headache, sore throat, left-sided chest pain, dental pain, vomiting, diarrhea, dysuria, rash, recent falls or injuries.  Aggravating factors for patient's pain and shortness of breath including any exertion or twisting.  No clear alleviating factors.  No prior similar episodes.         Past Medical History:  Diagnosis Date  . Acute postoperative pain 02/03/2018  . Anemia   . B12 deficiency   . Bacteremia 10/07/2014  . Bladder incontinence   . Blind left eye   . Cancer ([place])    skin cancer   . Cataract   . Cervical spine fracture ([place])   . Chest pain 07/29/2013  . Compression fracture    C7,  upper T spine -compression fx  . COPD (chronic obstructive pulmonary disease) ([place])   . DDD (degenerative disc disease), lumbar   . Depression    major  . Diffuse myofascial pain syndrome 03/24/2015  . Dyspnea    at times-  when activty  . Fever 10/05/2014  . GERD (gastroesophageal reflux disease)   . Hiatal hernia   . Hyperlipidemia   . Hypertension   . 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 Macular degeneration   . On home oxygen therapy    "3L; all the time" (04/30/2018)  . Osteoarthritis   . Osteoporosis   . Pneumonia    years ago  . Pulmonary embolism ([place]) 04/30/2018  . Reactive airway disease   . Rupture of bowel ([place])   . Sepsis ([place]) 10/08/2014  . Stroke [facility])    "mini stroke"- balance and memory issue  . Stroke ([place])   . Wrist pain, acute 09/10/2012    Patient Active Problem List   Diagnosis Date Noted  . Pulmonary emphysema ([place]) 11/11/2019  . Acute cystitis 07/29/2019  . Non-intractable vomiting 05/19/2019  . Anemia 05/04/2019  . History of deep vein thrombosis of lower extremity 01/05/2019  . GI bleed 10/29/2018  . ILD (interstitial lung disease) ([place]) 08/12/2018  . COPD exacerbation ([place]) 05/17/2018  . Pressure injury of skin 05/04/2018  . PE (pulmonary thromboembolism) ([place]) 04/30/2018  . Acute metabolic encephalopathy   . Respiratory failure with hypoxia ([place]) 04/20/2018  . CAP (community acquired pneumonia) 04/20/2018  . Acute lower UTI 04/20/2018  . Chronic lower extremity pain (Right) 04/07/2018  . Abnormal MRI, lumbar spine 04/07/2018  . Lumbar facet hypertrophy (Multilevel) (Bilateral) 04/07/2018  . Lumbar foraminal stenosis (L3-4, L4-5) (  Bilateral) 04/07/2018  . Annular tear of lumbar disc (L3-4, L4-5) 04/07/2018  . Closed hip fracture, sequela (Right) 02/03/2018  . It band syndrome, right 12/18/2017  . History of hip replacement (Right) 12/18/2017  . Ileus (HCC) 10/27/2017  . Primary osteoarthritis of hip 09/30/2017  . History of small bowel obstruction 09/10/2017  . 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 SBO (small bowel obstruction) (HCC) 08/24/2017  . Abnormal x-ray of pelvis 08/13/2017  . Other specified dorsopathies, sacral  and sacrococcygeal region 08/04/2017  . Spondylosis without myelopathy or radiculopathy, lumbosacral region 07/17/2017  . Non-traumatic compression fracture of T3 thoracic vertebra, sequela 07/02/2017  . High risk medication use 07/02/2017  . At high risk for falls 07/02/2017  . Chronic sacroiliac joint pain (Right) 07/02/2017  . CKD (chronic kidney disease) stage 3, GFR 30-59 ml/min 04/23/2017  . Chronic knee pain s/p total knee replacement (TKR) (Right) 04/02/2017  . DDD (degenerative disc disease), lumbar 03/05/2017  . Chronic knee pain (Bilateral) (R>L) 03/05/2017  . Osteoarthritis 03/05/2017  . Chronic musculoskeletal pain 03/05/2017  . Neurogenic pain 03/05/2017  . Chronic myofascial pain 02/12/2017  . Lumbar facet syndrome (Bilateral) (L>R) 02/12/2017  . Long term prescription benzodiazepine use 02/12/2017  . Disorder of skeletal system 02/12/2017  . Pharmacologic therapy 02/12/2017  . Problems influencing health status 02/12/2017  . 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 NSAID long-term use 02/12/2017  . DDD (degenerative disc disease), cervical 11/11/2016  . Lumbar facet arthropathy (Bilateral) 11/11/2016  . 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 CAFL (chronic airflow limitation) (HCC) 11/07/2016  . Depression, major, in partial remission (HCC) 11/07/2016  . 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 Long term current use of opiate analgesic 11/07/2016  . Long term prescription opiate use 11/07/2016  . Chronic pain syndrome 11/07/2016  . Chronic neck pain (Secondary Area of Pain) (Bilateral) (L>R) 11/07/2016  . Chronic hip pain (Right) 11/07/2016  . Age-related osteoporosis with current pathological fracture with routine healing 09/20/2016  . History of stroke 09/20/2016  . Intertrochanteric fracture of femur, sequela (Right) 09/13/2016  . Chronic shoulder pain (Bilateral) 08/12/2016  . Hx of total knee replacement (Bilateral) 04/04/2016  . Chronic  shoulder pain (Left) 02/28/2016  . Hoarseness 01/12/2016  . Pharyngoesophageal dysphagia 01/12/2016  . Chronic low back pain [facility]) (Bilateral) (L>R) 10/26/2015  . 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 Syncope, non cardiac 05/22/2015  . Thrombocytopenia (HCC) 10/07/2014  . Hypoxia 10/05/2014  . Anxiety 06/15/2014  . Duodenogastric reflux 06/15/2014  . Benign essential HTN 06/15/2014  . Hypercholesteremia 07/30/2013  . HTN (hypertension) 07/30/2013  . GERD (gastroesophageal reflux disease) 07/30/2013  . Memory loss 05/03/2013  . Chronic knee pain (Primary Area of Pain) (Right) 09/10/2012    Past Surgical History:  Procedure Laterality Date  . ABDOMINAL HYSTERECTOMY    . ABDOMINAL SURGERY    . APPENDECTOMY    . BLADDER SURGERY    . CATARACT EXTRACTION W/ INTRAOCULAR LENS  IMPLANT, BILATERAL Bilateral   . CHOLECYSTECTOMY    . COLON SURGERY    . COLOSTOMY REVERSAL    . ESOPHAGOGASTRODUODENOSCOPY N/A 10/30/2018   Procedure: ESOPHAGOGASTRODUODENOSCOPY (EGD);  Surgeon: Toney Reil, MD;  Location: [facility] ENDOSCOPY;  Service: Gastroenterology;  Laterality: N/A;  . INTRAMEDULLARY (IM) NAIL INTERTROCHANTERIC Right 09/13/2016   Procedure: INTRAMEDULLARY (IM) NAIL INTERTROCHANTRIC RIGHT;  Surgeon: Tarry Kos, MD;  Location: [facility] OR;  Service:  Orthopedics;  Laterality: Right;  . JOINT REPLACEMENT Bilateral   . JOINT REPLACEMENT    . KNEE SURGERY    . OSTOMY    . RECTOCELE REPAIR    . TOE SURGERY Right    3rd  . TOTAL HIP ARTHROPLASTY Right 09/30/2017   Procedure: RIGHT HIP IM NAIL REMOVAL WITH CONVERSION TO TOTAL HIP ARTHOPLASTY, anterior approach;  Surgeon: Renette Butters, MD;  Location: [facility];  Service: Orthopedics;  Laterality: Right;    Prior to Admission medications   Medication Sig Start Date End Date Taking? Authorizing Provider  acetaminophen (TYLENOL) 500 MG tablet Take 2 tablets  (1,000 mg total) by mouth 3 (three) times daily. Patient taking differently: Take 1,000 mg by mouth 2 (two) times daily.  08/25/18   Ria Bush, MD  albuterol (ACCUNEB) 1.25 MG/3ML nebulizer solution Take 3 mLs (1.25 mg total) by nebulization every 6 (six) hours as needed for wheezing. 08/26/18   [name], FNP  albuterol (VENTOLIN HFA) 108 (90 Base) MCG/ACT inhaler Inhale 2 puffs into the lungs every 6 (six) hours as needed for wheezing or shortness of breath. 09/25/18   [name], FNP  alendronate (FOSAMAX) 70 MG tablet TAKE 1 TABLET BY MOUTH ONCE A WEEK 06/09/19   [name], FNP  ALPRAZolam Duanne Moron) 1 MG tablet Take 0.5-1 tablets (0.5-1 mg total) by mouth 2 (two) times daily as needed for anxiety. 01/17/20   [name], FNP  amLODipine (NORVASC) 5 MG tablet TAKE 1 TABLET BY MOUTH EVERY DAY 08/30/19   [name], FNP  docusate sodium (COLACE) 100 MG capsule Take 200 mg by mouth 2 (two) times daily as needed for moderate constipation.     [provider]  DULoxetine (CYMBALTA) 20 MG capsule TAKE 2 CAPSULES BY MOUTH EVERY DAY 12/23/19   [name], FNP  ELIQUIS 2.5 MG TABS tablet TAKE 1 TABLET BY MOUTH TWICE A DAY 09/04/19   Earlie Server, MD  esomeprazole (NEXIUM) 40 MG capsule TAKE 1 CAPSULE BY MOUTH EVERY DAY 09/15/19   [name], FNP  FLOVENT HFA 220 MCG/ACT inhaler TAKE 1 PUFF BY MOUTH TWICE A DAY 12/14/19   [name], FNP  fluticasone (FLONASE) 50 MCG/ACT nasal spray SPRAY 2 SPRAYS INTO EACH NOSTRIL EVERY DAY 08/25/19   [name], FNP  LINZESS 290 MCG CAPS capsule TAKE 1 CAPSULE BY MOUTH EVERY DAY 08/30/19   [name], FNP  lovastatin (MEVACOR) 10 MG tablet TAKE 1 TABLET BY MOUTH EVERY DAY 11/03/19   [name], FNP  mirtazapine (REMERON) 7.5 MG tablet Take 1 tablet (7.5 mg total) by mouth at bedtime. 01/05/20   [name], FNP  NARCAN 4 MG/0.1ML LIQD nasal spray kit  09/10/18   [provider]   ondansetron (ZOFRAN ODT) 4 MG disintegrating tablet Take 1 tablet (4 mg total) by mouth every 8 (eight) hours as needed for nausea or vomiting. 06/12/18   [name], FNP  Oxycodone HCl 10 MG TABS Take 10 mg by mouth 3 (three) times daily. 09/23/18   [provider]  polyethylene glycol (MIRALAX / GLYCOLAX) packet Take 17 g by mouth daily as needed (for constipation.).     [provider]  pregabalin (LYRICA) 100 MG capsule TAKE 1 CAPSULE BY MOUTH TWICE A DAY 06/11/19   [name], FNP  promethazine (PHENERGAN) 12.5 MG tablet TAKE 1 TABLET (12.5 MG TOTAL) BY MOUTH EVERY 12 (TWELVE) HOURS AS NEEDED  FOR NAUSEA OR VOMITING. 12/30/19   [name], FNP  traZODone (DESYREL) 50 MG tablet TAKE 0.5-1.5 TABLETS (25-75 MG TOTAL) BY MOUTH AT BEDTIME AS NEEDED FOR SLEEP. 10/27/19   [name], FNP    Allergies Ampicillin, Ampicillin-sulbactam sodium, Ambien [zolpidem tartrate], Cyclobenzaprine, Tape, Toradol [ketorolac tromethamine], Sulfamethoxazole-trimethoprim, Zolpidem, Cephalexin, and Tramadol  Family History  Problem Relation Age of Onset  . Heart failure Mother   . Heart attack Father   . Lung cancer Sister   . Deep vein thrombosis Sister   . Pulmonary embolism Son   . Deep vein thrombosis Son   . Cervical cancer Daughter     Social History Social History   Tobacco Use  . Smoking status: Never Smoker  . Smokeless tobacco: Never Used  Vaping Use  . Vaping Use: Former  Substance Use Topics  . Alcohol use: Not Currently  . Drug use: Not Currently    Review of Systems  Review of Systems  Constitutional: Negative for chills and fever.  HENT: Negative for sore throat.   Eyes: Negative for pain.  Respiratory: Positive for shortness of breath. Negative for cough and stridor.   Cardiovascular: Positive for chest pain ( R chest).  Gastrointestinal: Negative for vomiting.  Skin: Negative for rash.  Neurological: Negative for seizures, loss of  consciousness and headaches.  Psychiatric/Behavioral: Negative for suicidal ideas.  All other systems reviewed and are negative.     ____________________________________________   PHYSICAL EXAM:  VITAL SIGNS: ED Triage Vitals  Enc Vitals Group     BP 01/31/20 1150 (!) 129/93     Pulse Rate 01/31/20 1150 (!) 118     Resp 01/31/20 1150 20     Temp 01/31/20 1150 97.9 F (36.6 C)     Temp Source 01/31/20 1150 Oral     SpO2 01/31/20 1150 96 %     Weight 01/31/20 1151 162 lb (73.5 kg)     Height 01/31/20 1151 _0  (1.626 m)     Head Circumference --      Peak Flow --      Pain Score 01/31/20 1151 10     Pain Loc --      Pain Edu? --      Excl. in [place]? --    Vitals:   01/31/20 1426 01/31/20 1628  BP: (!) 149/99 (!) 161/107  Pulse: (!) 117 (!) 107  Resp: 20 19  Temp: 98 F (36.7 C)   SpO2: 97% 95%   Physical Exam Vitals and nursing note reviewed.  Constitutional:      General: She is not in acute distress.    Appearance: She is well-developed.  HENT:     Head: Normocephalic and atraumatic.     Right Ear: External ear normal.     Left Ear: External ear normal.     Nose: Nose normal.     Mouth/Throat:     Mouth: Mucous membranes are moist.  Eyes:     Conjunctiva/sclera: Conjunctivae normal.  Cardiovascular:     Rate and Rhythm: Regular rhythm. Tachycardia present.     Heart sounds: No murmur heard.   Pulmonary:     Effort: Tachypnea present.     Breath sounds: Normal breath sounds.  Abdominal:     Palpations: Abdomen is soft.     Tenderness: There is no abdominal tenderness.  Musculoskeletal:     Cervical back: Neck supple.     Right lower leg: No edema.     Left  lower leg: No edema.  Skin:    General: Skin is warm and dry.     Capillary Refill: Capillary refill takes less than 2 seconds.  Neurological:     Mental Status: She is alert and oriented to person, place, and time.  Psychiatric:        Mood and Affect: Mood normal.       ____________________________________________   LABS (all labs ordered are listed, but only abnormal results are displayed)  Labs Reviewed  BASIC METABOLIC PANEL - Abnormal; Notable for the following components:      Result Value   Potassium 2.9 (*)    Glucose, Bld 110 (*)    All other components within normal limits  CBC - Abnormal; Notable for the following components:   Hemoglobin 10.7 (*)    HCT 35.8 (*)    MCH 24.8 (*)    MCHC 29.9 (*)    RDW 18.7 (*)    All other components within normal limits  HEPATIC FUNCTION PANEL - Abnormal; Notable for the following components:   Albumin 2.9 (*)    AST 72 (*)    Alkaline Phosphatase 129 (*)    All other components within normal limits  BLOOD GAS, VENOUS - Abnormal; Notable for the following components:   Bicarbonate 32.8 (*)    Acid-Base Excess 5.8 (*)    All other components within normal limits  SARS CORONAVIRUS 2 BY RT PCR (HOSPITAL ORDER, [place] [facility])  BRAIN NATRIURETIC PEPTIDE  PROCALCITONIN  MAGNESIUM  TROPONIN I (HIGH SENSITIVITY)  TROPONIN I (HIGH SENSITIVITY)   ____________________________________________  EKG  Sinus rhythm with a ventricular rate of 96, normal axis, unremarkable, low voltage in lead I with no clear evidence of acute ischemia or other significant underlying arrhythmia. ____________________________________________  RADIOLOGY  Official radiology report(s): DG Chest 2 View  Result Date: 01/31/2020 CLINICAL DATA:  Right-sided chest pain. EXAM: CHEST - 2 VIEW COMPARISON:  Chest CT January 03, 2020 FINDINGS: Cardiomediastinal silhouette is normal. Mediastinal contours appear intact. Tortuosity of the aorta. Right lower lobe lung mass is not changed radiographically. Coarsening of the interstitium. Osseous structures are without acute abnormality. Soft tissues are grossly normal. IMPRESSION: 1. Right lower lobe lung mass is not changed radiographically. 2. Coarsening of the  interstitium. Electronically Signed   By: Fidela Salisbury M.D.   On: 01/31/2020 12:28   CT Angio Chest PE W and/or Wo Contrast  Result Date: 01/31/2020 CLINICAL DATA:  Lung cancer, chest pain EXAM: CT ANGIOGRAPHY CHEST WITH CONTRAST TECHNIQUE: Multidetector CT imaging of the chest was performed using the standard protocol during bolus administration of intravenous contrast. Multiplanar CT image reconstructions and MIPs were obtained to evaluate the vascular anatomy. CONTRAST:  3m OMNIPAQUE IOHEXOL 350 MG/ML SOLN COMPARISON:  01/03/2020 FINDINGS: Cardiovascular: Excellent opacification of the pulmonary arterial tree. No intraluminal filling defect identified to suggest acute pulmonary embolism. The central pulmonary arteries are mildly enlarged in keeping with changes of pulmonary arterial hypertension, stable since prior examination. Cardiac size is within normal limits. No pericardial effusion. The thoracic aorta is of normal caliber with mild atherosclerotic calcification noted within its descending segment. Mediastinum/Nodes: Pathologic as ago esophageal recess node is again identified, stable at 13 mm x 16 mm at axial image 40/3 shotty right paratracheal and precarinal adenopathy is again identified without frank pathologic enlargement. Thyroid unremarkable. Esophagus unremarkable. Lungs/Pleura: The pleural based right posterior basal lower lobe pulmonary mass is again identified and is unchanged measuring 2.7 x 3.9  cm in greatest dimension at axial image # 47/5. Central necrosis with a small amount of intraluminal gas is again identified. The mass abuts the pleural margin, however, there is no definite extension into the chest wall itself. No pneumothorax or pleural effusion. Mild subpleural pulmonary fibrosis is again identified diffusely. No new focal pulmonary infiltrate. Central airways are widely patent. Upper Abdomen: Multiple simple cortical cysts are seen within the left kidney. There is  surgical changes of cholecystectomy identified with moderate to severe extrahepatic biliary ductal dilation. 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 This may represent bulbous distension of the ampulla into the duodenal lumen as can be seen with ampullary stenosis or a primary neoplasm of the ampulla. The lack of significant metabolic activity on prior PET CT examination favors the former. Musculoskeletal: No acute bone abnormality. Review of the MIP images confirms the above findings. IMPRESSION: 1. No evidence of acute pulmonary embolism. 2. Stable size and appearance of right lower lobe pulmonary mass and associated pathologic azygoesophageal recess node. 3. Moderate to severe extrahepatic biliary ductal dilation with an ampullary mass better identified on the current examination. This may represent bulbous distension of the ampulla into the duodenal lumen as can be seen with ampullary stenosis or a primary neoplasm of the ampulla. The lack of significant metabolic activity on prior PET CT examination favors the former. Correlation with endoscopy would be helpful for further evaluation. Aortic Atherosclerosis (ICD10-I70.0). Electronically Signed   By: Fidela Salisbury MD   On: 01/31/2020 16:30   CT T-SPINE NO CHARGE  Result Date: 01/31/2020 CLINICAL DATA:  Lung cancer, right chest pain EXAM: CT THORACIC SPINE WITHOUT CONTRAST TECHNIQUE: Multidetector CT images of the thoracic were obtained using the standard protocol without intravenous contrast. COMPARISON:  None. Findings are correlated with PET CT examination of 01/25/2020 and concurrently performed CT arteriogram of the chest FINDINGS: Alignment: Normal thoracic kyphosis.  No listhesis. Vertebrae: Mild anterior wedge compression fracture T1 and T3 are identified with approximately 40% and 20% loss of height, respectively. No acute fracture of the  thoracic spine. There is no lytic or blastic bone lesion identified involving the vertebral bodies of the thoracic spine. There is, however, subtle erosion of the a right eighth rib just lateral to the a costovertebral junction, best seen on axial image # 67/11 and 67/10 in keeping with chest wall invasion and direct involvement of the a thoracic cage by the primary mass within the posterior basal right lower lobe. Paraspinal and other soft tissues: Posterior basal right lower lobe pleural based central necrotic pulmonary mass again identified. Mild subpleural pulmonary fibrosis again noted. Pathologic as ago esophageal recess noted. Multiple simple cortical cysts are seen within the visualized left kidney. Disc levels: Intervertebral disc height has been preserved though there are discogenic changes noted predominantly at T5-T11 in keeping with changes of mild to moderate degenerative disc disease. Review of the axial images demonstrates no significant canal stenosis. No significant neural foraminal narrowing. IMPRESSION: 1. No lytic or blastic bone lesion identified involving the vertebral bodies of the thoracic spine. 2. Mild anterior wedge compression fractures of T1 and T3 are identified with approximately 40% and 20% loss of height, respectively. These appear chronic in nature. 3. No significant canal stenosis or neural foraminal narrowing is seen within the thoracic spine. 4. Posterior basal right lower lobe pleural based central necrotic pulmonary mass again identified. There is subtle  erosion of the right eighth rib just lateral to the a costovertebral junction in keeping with chest wall invasion and direct involvement of the a thoracic cage by the primary mass within the posterior basal right lower lobe. Electronically Signed   By: Fidela Salisbury MD   On: 01/31/2020 16:41    ____________________________________________   PROCEDURES  Procedure(s) performed (including Critical  Care):  Procedures   ____________________________________________   INITIAL IMPRESSION / ASSESSMENT AND PLAN / ED COURSE        Overall patient's history, exam, and ED work-up is most consistent for right-sided chest pain secondary to known bronchogenic carcinoma likely now with chest wall invasion at the eighth right rib.  No evidence of PE or acute infectious process on CTA chest.  In addition patient has no fever or elevated white blood cell count to suggest pneumonia.  There are no lytic lesions identified on CT of the T-spine or acute fracture but there are noted chronic compression fractures.  Patient is on her home 2 L of oxygen and she has no evidence of hypercarbic failure on her blood gas.  She is not volume overloaded and I will suspicion for ACS given reassuring EKG and to nonelevated troponins obtained over 2 hours.  I did have extensive discussion with the patient regarding my concerns that her pain was likely secondary to her cancer.  Advised patient that we would gladly admit her for pain control patient is adamant she wanted to be discharged and states she has oxycodone at home and appointment with her oncologist tomorrow morning.  After receiving below noted analgesia and IV fluids on reassessment her heart rate had improved to the high 90s.  Given no other clear acute process likely causing patient symptoms which are likely stemming from her cancer with improvement in her pain and vital signs I do believe this is reasonable.  I also discussed this with her daughter was updated on plan of care.  Patient was discharged stable condition strict precautions were advised discussed.  Emphasized the patient importance of going to her oncologist appointment tomorrow morning advised her to continue taking her oxycodone as prescribed for pain overnight.   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 (HCC)  Compression fracture of thoracic vertebra, initial encounter, unspecified thoracic vertebral level (HCC)  Hypokalemia    Medications  oxyCODONE (Oxy IR/ROXICODONE) immediate release tablet 10 mg (10 mg Oral Given 01/31/20 1709)  pregabalin (LYRICA) capsule 100 mg (has no administration in time range)  lidocaine (LIDODERM) 5 % 1 patch (has no administration in time range)  potassium chloride SA (KLOR-CON) CR tablet 40 mEq (40 mEq Oral Given 01/31/20 1534)  fentaNYL (SUBLIMAZE) injection 50 mcg (50 mcg Intravenous Given 01/31/20 1541)  acetaminophen (TYLENOL) tablet 1,000 mg (1,000 mg Oral Given 01/31/20 1709)  iohexol (OMNIPAQUE) 350 MG/ML injection 75 mL (75 mLs Intravenous Contrast Given 01/31/20 1553)  lactated ringers bolus 500 mL (500 mLs Intravenous New Bag/Given 01/31/20 1711)     ED Discharge Orders    None       Note:  This document was prepared using Dragon voice recognition software and may include unintentional dictation errors.   Lucrezia Starch, MD 01/31/20 239 [id number]

## 2020-02-01 ENCOUNTER — Telehealth: Payer: Self-pay

## 2020-02-01 ENCOUNTER — Inpatient Hospital Stay (HOSPITAL_BASED_OUTPATIENT_CLINIC_OR_DEPARTMENT_OTHER): Payer: Medicare Other | Admitting: Oncology

## 2020-02-01 ENCOUNTER — Encounter: Payer: Self-pay | Admitting: Oncology

## 2020-02-01 ENCOUNTER — Encounter: Payer: Self-pay | Admitting: Family Medicine

## 2020-02-01 ENCOUNTER — Inpatient Hospital Stay: Payer: Medicare Other | Attending: Oncology

## 2020-02-01 VITALS — BP 134/87 | HR 114 | Temp 97.3°F | Resp 18 | Wt 163.7 lb

## 2020-02-01 DIAGNOSIS — Z86711 Personal history of pulmonary embolism: Secondary | ICD-10-CM

## 2020-02-01 DIAGNOSIS — Z8673 Personal history of transient ischemic attack (TIA), and cerebral infarction without residual deficits: Secondary | ICD-10-CM | POA: Diagnosis not present

## 2020-02-01 DIAGNOSIS — F329 Major depressive disorder, single episode, unspecified: Secondary | ICD-10-CM | POA: Insufficient documentation

## 2020-02-01 DIAGNOSIS — D649 Anemia, unspecified: Secondary | ICD-10-CM

## 2020-02-01 DIAGNOSIS — R918 Other nonspecific abnormal finding of lung field: Secondary | ICD-10-CM | POA: Insufficient documentation

## 2020-02-01 DIAGNOSIS — Z79899 Other long term (current) drug therapy: Secondary | ICD-10-CM | POA: Insufficient documentation

## 2020-02-01 DIAGNOSIS — F419 Anxiety disorder, unspecified: Secondary | ICD-10-CM

## 2020-02-01 DIAGNOSIS — R590 Localized enlarged lymph nodes: Secondary | ICD-10-CM | POA: Insufficient documentation

## 2020-02-01 DIAGNOSIS — Z86718 Personal history of other venous thrombosis and embolism: Secondary | ICD-10-CM | POA: Insufficient documentation

## 2020-02-01 DIAGNOSIS — R040 Epistaxis: Secondary | ICD-10-CM

## 2020-02-01 DIAGNOSIS — Z7901 Long term (current) use of anticoagulants: Secondary | ICD-10-CM | POA: Insufficient documentation

## 2020-02-01 DIAGNOSIS — I2510 Atherosclerotic heart disease of native coronary artery without angina pectoris: Secondary | ICD-10-CM | POA: Diagnosis not present

## 2020-02-01 LAB — COMPREHENSIVE METABOLIC PANEL
ALT: 20 U/L (ref 0–44)
AST: 37 U/L (ref 15–41)
Albumin: 3 g/dL — ABNORMAL LOW (ref 3.5–5.0)
Alkaline Phosphatase: 131 U/L — ABNORMAL HIGH (ref 38–126)
Anion gap: 8 (ref 5–15)
BUN: 15 mg/dL (ref 8–23)
CO2: 28 mmol/L (ref 22–32)
Calcium: 9.2 mg/dL (ref 8.9–10.3)
Chloride: 104 mmol/L (ref 98–111)
Creatinine, Ser: 0.78 mg/dL (ref 0.44–1.00)
GFR calc Af Amer: >60 mL/min (ref >60)
GFR calc non Af Amer: >60 mL/min (ref >60)
Glucose, Bld: 124 mg/dL — ABNORMAL HIGH (ref 70–99)
Potassium: 3.1 mmol/L — ABNORMAL LOW (ref 3.5–5.1)
Sodium: 140 mmol/L (ref 135–145)
Total Bilirubin: 0.4 mg/dL (ref 0.3–1.2)
Total Protein: 7.8 g/dL (ref 6.5–8.1)

## 2020-02-01 LAB — CBC WITH DIFFERENTIAL/PLATELET
Abs Immature Granulocytes: 0.02 10*3/uL (ref 0.00–0.07)
Basophils Absolute: 0.1 10*3/uL (ref 0.0–0.1)
Basophils Relative: 1 %
Eosinophils Absolute: 0.2 10*3/uL (ref 0.0–0.5)
Eosinophils Relative: 3 %
HCT: 33.9 % — ABNORMAL LOW (ref 36.0–46.0)
Hemoglobin: 10.7 g/dL — ABNORMAL LOW (ref 12.0–15.0)
Immature Granulocytes: 0 %
Lymphocytes Relative: 27 %
Lymphs Abs: 1.9 10*3/uL (ref 0.7–4.0)
MCH: 25.4 pg — ABNORMAL LOW (ref 26.0–34.0)
MCHC: 31.6 g/dL (ref 30.0–36.0)
MCV: 80.3 fL (ref 80.0–100.0)
Monocytes Absolute: 0.8 10*3/uL (ref 0.1–1.0)
Monocytes Relative: 11 %
Neutro Abs: 4.1 10*3/uL (ref 1.7–7.7)
Neutrophils Relative %: 58 %
Platelets: 355 10*3/uL (ref 150–400)
RBC: 4.22 MIL/uL (ref 3.87–5.11)
RDW: 18.9 % — ABNORMAL HIGH (ref 11.5–15.5)
WBC: 7.1 10*3/uL (ref 4.0–10.5)
nRBC: 0 % (ref 0.0–0.2)

## 2020-02-01 LAB — IRON AND TIBC
Iron: 49 ug/dL (ref 28–170)
Saturation Ratios: 15 % (ref 10.4–31.8)
TIBC: 326 ug/dL (ref 250–450)
UIBC: 277 ug/dL

## 2020-02-01 LAB — VITAMIN B12: Vitamin B-12: 186 pg/mL (ref 180–914)

## 2020-02-01 LAB — FERRITIN: Ferritin: 11 ng/mL (ref 11–307)

## 2020-02-01 LAB — FOLATE: Folate: 12.4 ng/mL (ref >5.9)

## 2020-02-01 NOTE — Progress Notes (Addendum)
Hematology/Oncology follow up note [facility] [phone] [phone]   Patient Care Team: [name], FNP as PCP - General (Nurse Practitioner) Kate Sable, MD as PCP - Cardiology (Cardiology) [name], FNP (Nurse Practitioner) Telford Nab, RN as Oncology Nurse Navigator  REFERRING PROVIDER: [name], FNP  CHIEF COMPLAINTS/REASON FOR VISIT:  Follow-up for history of thrombosis, anticoagulation, and lung mass.  HISTORY OF PRESENTING ILLNESS:  [name] is a  79 y.o.  female with PMH listed below who was referred to me for evaluation of pulmonary embolism.  PE was diagnosed 04/30/2018, CT chest angiogram showed pulmonary embolism with right heart strain..  05/02/2018 US duplex lower extremities showed  Right: age indeterminate deep vein thrombosis involving the right posterior tibial vein, and right peroneal vein.  Left: acute deep vein thrombosis involving the left posterior tibial vein, and left peroneal vein.  She is on Eliquis 78m BID currently.  PE and DVT events occurred shortly after her hospitalization due to pneumonia.  Patient has chronic ambulation difficulty after her right hip arthroplasty  in May 2019. She subsequently had several complications immediately postoperative of fecal impaction and ileus.  This was done after an intertrochanteric hip fracture on the right side.She has had multiple ER visits over the past several months.  Also complains chronic lower extremities "soreness" for several month.   Denies any previous history of other thrombosis events. Deneis dami Per daughter, patient's son also developed "leg clot".   INTERVAL HISTORY Nicole NEUSERis a 79y.o. female who has above history reviewed by me today presents for follow up visit for management of pulmonary embolism, chronic lower extremity DVT.  No mass Problems and complaints are listed below: Patient was accompanied  by her daughter. Patient had a virtual visit with me on August 30, 2019. Since then patient had a few ER visits and has been seen by primary care provider. 01/03/2020 patient had a CT chest angiogram done for evaluation of right upper quadrant and flank pain Study showed no acute PE.  Chronic pulmonary fibrosis with a new 3 cm rounded opacity in the superior segment of right lower lobe.  Superimposed new right lung pleural thickening. 01/25/2020, PET scan showed right lower lobe mass with SUV max of 18.5.  Hypermetabolic adenopathy within the azygos esophageal recess, SUV of 16.2.  A low right paratracheal node with equivocal SUV activity. She presented emergency room on 01/31/2020 due to flank pain.  Had another CT chest angiogram done which showed no pulmonary embolism.   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  There was no metabolic activity on recent PET scan, favoring ampullary stenosis.  Patient presents for follow-up and discussion of abnormal image findings. She reports right hip pain.  Patient follows up with pain clinic for pain management. She has chronic history of anxiety and is on Xanax, Cymbalta.  Anxiety has worsened since she was notified that abnormal image findings.  Review of Systems  Constitutional: Positive for fatigue. Negative for appetite change, chills and fever.  HENT:   Negative for hearing loss and voice change.   Eyes: Negative for eye problems.  Respiratory: Positive for shortness of breath. Negative for chest tightness and cough.   Cardiovascular: Negative for chest pain.  Gastrointestinal: Negative for abdominal distention, abdominal pain  and blood in stool.  Endocrine: Negative for hot flashes.  Genitourinary: Negative for difficulty urinating and frequency.         Right hip pain  Musculoskeletal: Positive for arthralgias.  Skin: Negative for itching and rash.  Neurological: Negative for extremity weakness.  Hematological: Negative for adenopathy.  Psychiatric/Behavioral: Negative for confusion.    MEDICAL HISTORY:  Past Medical History:  Diagnosis Date  . Acute postoperative pain 02/03/2018  . Anemia   . B12 deficiency   . Bacteremia 10/07/2014  . Bladder incontinence   . Blind left eye   . Cancer ([place])    skin cancer   . Cataract   . Cervical spine fracture ([place])   . Chest pain 07/29/2013  . Compression fracture    C7,  upper T spine -compression fx  . COPD (chronic obstructive pulmonary disease) ([place])   . DDD (degenerative disc disease), lumbar   . Depression    major  . Diffuse myofascial pain syndrome 03/24/2015  . Dyspnea    at times- when activty  . Fever 10/05/2014  . GERD (gastroesophageal reflux disease)   . Hiatal hernia   . Hyperlipidemia   . Hypertension   . 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 Macular degeneration   . On home oxygen therapy    "3L; all the time" (04/30/2018)  . Osteoarthritis   . Osteoporosis   . Pneumonia    years ago  . Pulmonary embolism ([place]) 04/30/2018  . Reactive airway disease   . Rupture of bowel ([place])   . Sepsis ([place]) 10/08/2014  . Stroke [facility])    "mini stroke"- balance and memory issue  . Stroke ([place])   . Wrist pain, acute 09/10/2012    SURGICAL HISTORY: Past Surgical History:  Procedure Laterality Date  . ABDOMINAL HYSTERECTOMY    . ABDOMINAL SURGERY    . APPENDECTOMY    . BLADDER SURGERY    . CATARACT EXTRACTION W/ INTRAOCULAR LENS  IMPLANT, BILATERAL Bilateral   . CHOLECYSTECTOMY    . COLON SURGERY    . COLOSTOMY REVERSAL    . ESOPHAGOGASTRODUODENOSCOPY N/A 10/30/2018   Procedure: ESOPHAGOGASTRODUODENOSCOPY (EGD);  Surgeon: Lin Landsman, MD;  Location: [facility] ENDOSCOPY;  Service: Gastroenterology;  Laterality: N/A;  . INTRAMEDULLARY (IM) NAIL  INTERTROCHANTERIC Right 09/13/2016   Procedure: INTRAMEDULLARY (IM) NAIL INTERTROCHANTRIC RIGHT;  Surgeon: Leandrew Koyanagi, MD;  Location: [place];  Service: Orthopedics;  Laterality: Right;  . JOINT REPLACEMENT Bilateral   . JOINT REPLACEMENT    . KNEE SURGERY    . OSTOMY    . RECTOCELE REPAIR    . TOE SURGERY Right    3rd  . TOTAL HIP ARTHROPLASTY Right 09/30/2017   Procedure: RIGHT HIP IM NAIL REMOVAL WITH CONVERSION TO TOTAL HIP ARTHOPLASTY, anterior approach;  Surgeon: Renette Butters, MD;  Location: [place];  Service: Orthopedics;  Laterality: Right;    SOCIAL HISTORY: Social History   Socioeconomic History  . Marital status: Widowed    Spouse name: Not on file  . Number of children: 3  . Years of education: middle sch  . Highest education level: Not on file  Occupational History  . Occupation: retired    Comment: n/a  Tobacco Use  . Smoking status: Never Smoker  . Smokeless tobacco: Never Used  Vaping Use  . Vaping Use: Former  Substance and Sexual Activity  . Alcohol use: Not Currently  . Drug use: Not Currently  . Sexual activity: Not Currently  Other Topics  Concern  . Not on file  Social History Narrative   ** Merged History Encounter **       ** Merged History Encounter **       Patient lives at home with daughter. Caffeine Use: 4 cups daily   Social Determinants of Health   Financial Resource Strain: Low Risk   . Difficulty of Paying Living Expenses: Not hard at all  Food Insecurity: No Food Insecurity  . Worried About Charity fundraiser in the Last Year: Never true  . Ran Out of Food in the Last Year: Never true  Transportation Needs: No Transportation Needs  . Lack of Transportation (Medical): No  . Lack of Transportation (Non-Medical): No  Physical Activity: Inactive  . Days of Exercise per Week: 0 days  . Minutes of Exercise per Session: 0 min  Stress: Stress Concern Present  . Feeling of Stress : Very much  Social Connections:   . Frequency of  Communication with Friends and Family: Not on file  . Frequency of Social Gatherings with Friends and Family: Not on file  . Attends Religious Services: Not on file  . Active Member of Clubs or Organizations: Not on file  . Attends Archivist Meetings: Not on file  . Marital Status: Not on file  Intimate Partner Violence: Not At Risk  . Fear of Current or Ex-Partner: No  . Emotionally Abused: No  . Physically Abused: No  . Sexually Abused: No    FAMILY HISTORY: Family History  Problem Relation Age of Onset  . Heart failure Mother   . Heart attack Father   . Lung cancer Sister   . Deep vein thrombosis Sister   . Pulmonary embolism Son   . Deep vein thrombosis Son   . Cervical cancer Daughter     ALLERGIES:  is allergic to ampicillin, ampicillin-sulbactam sodium, ambien [zolpidem tartrate], cyclobenzaprine, tape, toradol [ketorolac tromethamine], sulfamethoxazole-trimethoprim, zolpidem, cephalexin, and tramadol.  MEDICATIONS:  Current Outpatient Medications  Medication Sig Dispense Refill  . acetaminophen (TYLENOL) 500 MG tablet Take 2 tablets (1,000 mg total) by mouth 3 (three) times daily. (Patient taking differently: Take 1,000 mg by mouth 2 (two) times daily. ) 30 tablet 0  . albuterol (ACCUNEB) 1.25 MG/3ML nebulizer solution Take 3 mLs (1.25 mg total) by nebulization every 6 (six) hours as needed for wheezing. 75 mL 1  . albuterol (VENTOLIN HFA) 108 (90 Base) MCG/ACT inhaler Inhale 2 puffs into the lungs every 6 (six) hours as needed for wheezing or shortness of breath. 1 Inhaler 2  . alendronate (FOSAMAX) 70 MG tablet TAKE 1 TABLET BY MOUTH ONCE A WEEK 12 tablet 1  . ALPRAZolam (XANAX) 1 MG tablet Take 0.5-1 tablets (0.5-1 mg total) by mouth 2 (two) times daily as needed for anxiety. 60 tablet 0  . amLODipine (NORVASC) 5 MG tablet TAKE 1 TABLET BY MOUTH EVERY DAY 90 tablet 1  . docusate sodium (COLACE) 100 MG capsule Take 200 mg by mouth 2 (two) times daily as needed  for moderate constipation.     . DULoxetine (CYMBALTA) 20 MG capsule TAKE 2 CAPSULES BY MOUTH EVERY DAY 180 capsule 0  . ELIQUIS 2.5 MG TABS tablet TAKE 1 TABLET BY MOUTH TWICE A DAY 60 tablet 3  . esomeprazole (NEXIUM) 40 MG capsule TAKE 1 CAPSULE BY MOUTH EVERY DAY 90 capsule 1  . FLOVENT HFA 220 MCG/ACT inhaler TAKE 1 PUFF BY MOUTH TWICE A DAY 24 Inhaler 5  . fluticasone (FLONASE) 50  MCG/ACT nasal spray SPRAY 2 SPRAYS INTO EACH NOSTRIL EVERY DAY 48 mL 1  . LINZESS 290 MCG CAPS capsule TAKE 1 CAPSULE BY MOUTH EVERY DAY 90 capsule 1  . lovastatin (MEVACOR) 10 MG tablet TAKE 1 TABLET BY MOUTH EVERY DAY 90 tablet 1  . mirtazapine (REMERON) 7.5 MG tablet Take 1 tablet (7.5 mg total) by mouth at bedtime. 30 tablet 5  . ondansetron (ZOFRAN ODT) 4 MG disintegrating tablet Take 1 tablet (4 mg total) by mouth every 8 (eight) hours as needed for nausea or vomiting. 30 tablet 0  . orphenadrine (NORFLEX) 100 MG tablet 100 mg as needed.    . Oxycodone HCl 10 MG TABS Take 10 mg by mouth 3 (three) times daily.    . polyethylene glycol (MIRALAX / GLYCOLAX) packet Take 17 g by mouth daily as needed (for constipation.).     Marland Kitchen pregabalin (LYRICA) 100 MG capsule TAKE 1 CAPSULE BY MOUTH TWICE A DAY 180 capsule 1  . promethazine (PHENERGAN) 12.5 MG tablet TAKE 1 TABLET (12.5 MG TOTAL) BY MOUTH EVERY 12 (TWELVE) HOURS AS NEEDED FOR NAUSEA OR VOMITING. 20 tablet 1  . NARCAN 4 MG/0.1ML LIQD nasal spray kit  (Patient not taking: Reported on 02/01/2020)    . traZODone (DESYREL) 50 MG tablet TAKE 0.5-1.5 TABLETS (25-75 MG TOTAL) BY MOUTH AT BEDTIME AS NEEDED FOR SLEEP. (Patient not taking: Reported on 02/01/2020) 135 tablet 1   No current facility-administered medications for this visit.     PHYSICAL EXAMINATION: ECOG PERFORMANCE STATUS: 3 - Symptomatic, >50% confined to bed Vitals:   02/01/20 1026  BP: 134/87  Pulse: (!) 114  Resp: 18  Temp: (!) 97.3 F (36.3 C)  SpO2: 96%   Filed Weights   02/01/20 1026    Weight: 163 lb 11.2 oz (74.3 kg)    Physical Exam Constitutional:      General: She is not in acute distress.    Appearance: She is ill-appearing.     Comments: Sits in wheelchair  Eyes:     General: No scleral icterus.    Pupils: Pupils are equal, round, and reactive to light.  Cardiovascular:     Rate and Rhythm: Normal rate and regular rhythm.     Heart sounds: Normal heart sounds.  Pulmonary:     Effort: Pulmonary effort is normal. No respiratory distress.     Breath sounds: No wheezing.     Comments: Bibasilar crackles Abdominal:     General: Bowel sounds are normal. There is no distension.     Palpations: Abdomen is soft. There is no mass.     Tenderness: There is no abdominal tenderness.  Musculoskeletal:        General: Swelling present. No deformity. Normal range of motion.     Cervical back: Normal range of motion and neck supple.  Skin:    General: Skin is warm and dry.     Findings: No erythema or rash.  Neurological:     Mental Status: She is alert and oriented to person, place, and time.     Cranial Nerves: No cranial nerve deficit.     Coordination: Coordination normal.      LABORATORY DATA:  I have reviewed the data as listed Lab Results  Component Value Date   WBC 7.1 02/01/2020   HGB 10.7 (L) 02/01/2020   HCT 33.9 (L) 02/01/2020   MCV 80.3 02/01/2020   PLT 355 02/01/2020   Recent Labs    01/02/20 2137 01/31/20 1155  02/01/20 0947  NA 135 140 140  K 3.4* 2.9* 3.1*  CL 99 102 104  CO2 '29 30 28  ' GLUCOSE 85 110* 124*  BUN '11 15 15  ' CREATININE 0.90 0.90 0.78  CALCIUM 9.3 9.3 9.2  GFRNONAA >60 >60 >60  GFRAA >60 >60 >60  PROT 8.0 7.6 7.8  ALBUMIN 2.7* 2.9* 3.0*  AST 27 72* 37  ALT '13 24 20  ' ALKPHOS 97 129* 131*  BILITOT 0.2* 0.4 0.4  BILIDIR  --  <0.1  --   IBILI  --  NOT CALCULATED  --    Iron/TIBC/Ferritin/ %Sat    Component Value Date/Time   IRON 49 02/01/2020 0947   TIBC 326 02/01/2020 0947   FERRITIN 11 02/01/2020 0947    IRONPCTSAT 15 02/01/2020 0947      RADIOGRAPHIC STUDIES: I have personally reviewed the radiological images as listed and agreed with the findings in the report. DG Chest 2 View  Result Date: 01/31/2020 CLINICAL DATA:  Right-sided chest pain. EXAM: CHEST - 2 VIEW COMPARISON:  Chest CT January 03, 2020 FINDINGS: Cardiomediastinal silhouette is normal. Mediastinal contours appear intact. Tortuosity of the aorta. Right lower lobe lung mass is not changed radiographically. Coarsening of the interstitium. Osseous structures are without acute abnormality. Soft tissues are grossly normal. IMPRESSION: 1. Right lower lobe lung mass is not changed radiographically. 2. Coarsening of the interstitium. Electronically Signed   By: Fidela Salisbury M.D.   On: 01/31/2020 12:28   DG Chest 2 View  Result Date: 01/02/2020 CLINICAL DATA:  Lower right chest pain. EXAM: CHEST - 2 VIEW COMPARISON:  July 15, 2019 FINDINGS: Mild, diffuse chronic appearing increased interstitial lung markings are noted. This is slightly more prominent within the bilateral lower lobes. There is no evidence of acute infiltrate, pleural effusion or pneumothorax. The cardiac silhouette is mildly enlarged. There is tortuosity of the descending thoracic aorta. The visualized skeletal structures are unremarkable. IMPRESSION: Chronic appearing increased interstitial lung markings without evidence of acute or active cardiopulmonary disease. Electronically Signed   By: Virgina Norfolk M.D.   On: 01/02/2020 22:18   DG Ribs Unilateral W/Chest Right  Result Date: 01/17/2020 CLINICAL DATA:  Right rib pain EXAM: RIGHT RIBS AND CHEST - 3+ VIEW COMPARISON:  01/02/2020 FINDINGS: Heart is normal size. Calcified granuloma in the left upper lobe. Diffuse interstitial prominence within the lungs, stable. No effusions or pneumothorax. No acute bony abnormality. No visible rib fracture. IMPRESSION: No visible displaced rib fracture. Diffuse interstitial  prominence within the lungs likely reflects chronic interstitial lung disease. Electronically Signed   By: Rolm Baptise M.D.   On: 01/17/2020 22:47   CT Angio Chest PE W and/or Wo Contrast  Result Date: 01/31/2020 CLINICAL DATA:  Lung cancer, chest pain EXAM: CT ANGIOGRAPHY CHEST WITH CONTRAST TECHNIQUE: Multidetector CT imaging of the chest was performed using the standard protocol during bolus administration of intravenous contrast. Multiplanar CT image reconstructions and MIPs were obtained to evaluate the vascular anatomy. CONTRAST:  79m OMNIPAQUE IOHEXOL 350 MG/ML SOLN COMPARISON:  01/03/2020 FINDINGS: Cardiovascular: Excellent opacification of the pulmonary arterial tree. No intraluminal filling defect identified to suggest acute pulmonary embolism. The central pulmonary arteries are mildly enlarged in keeping with changes of pulmonary arterial hypertension, stable since prior examination. Cardiac size is within normal limits. No pericardial effusion. The thoracic aorta is of normal caliber with mild atherosclerotic calcification noted within its descending segment. Mediastinum/Nodes: Pathologic as ago esophageal recess node is again identified, stable at 13 mm  x 16 mm at axial image 40/3 shotty right paratracheal and precarinal adenopathy is again identified without frank pathologic enlargement. Thyroid unremarkable. Esophagus unremarkable. Lungs/Pleura: The pleural based right posterior basal lower lobe pulmonary mass is again identified and is unchanged measuring 2.7 x 3.9 cm in greatest dimension at axial image # 47/5. Central necrosis with a small amount of intraluminal gas is again identified. The mass abuts the pleural margin, however, there is no definite extension into the chest wall itself. No pneumothorax or pleural effusion. Mild subpleural pulmonary fibrosis is again identified diffusely. No new focal pulmonary infiltrate. Central airways are widely patent. Upper Abdomen: Multiple simple  cortical cysts are seen within the left kidney. There is surgical changes of cholecystectomy identified with moderate to severe extrahepatic biliary ductal dilation. 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 This may represent bulbous distension of the ampulla into the duodenal lumen as can be seen with ampullary stenosis or a primary neoplasm of the ampulla. The lack of significant metabolic activity on prior PET CT examination favors the former. Musculoskeletal: No acute bone abnormality. Review of the MIP images confirms the above findings. IMPRESSION: 1. No evidence of acute pulmonary embolism. 2. Stable size and appearance of right lower lobe pulmonary mass and associated pathologic azygoesophageal recess node. 3. Moderate to severe extrahepatic biliary ductal dilation with an ampullary mass better identified on the current examination. This may represent bulbous distension of the ampulla into the duodenal lumen as can be seen with ampullary stenosis or a primary neoplasm of the ampulla. The lack of significant metabolic activity on prior PET CT examination favors the former. Correlation with endoscopy would be helpful for further evaluation. Aortic Atherosclerosis (ICD10-I70.0). Electronically Signed   By: Fidela Salisbury MD   On: 01/31/2020 16:30   CT Angio Chest PE W and/or Wo Contrast  Result Date: 01/03/2020 CLINICAL DATA:  79 year old female with chest and abdominal pain. Right upper quadrant and flank pain since 0800 hours. EXAM: CT ANGIOGRAPHY CHEST WITH CONTRAST TECHNIQUE: Multidetector CT imaging of the chest was performed using the standard protocol during bolus administration of intravenous contrast. Multiplanar CT image reconstructions and MIPs were obtained to evaluate the vascular anatomy. CONTRAST:  174m OMNIPAQUE IOHEXOL 350 MG/ML SOLN COMPARISON:  CTA chest 05/17/2018. CT Abdomen  and Pelvis today reported separately. FINDINGS: Cardiovascular: Excellent contrast bolus timing in the pulmonary arterial tree. No focal filling defect identified in the pulmonary arteries to suggest acute pulmonary embolism. Calcified coronary artery atherosclerosis. Cardiac size remains within normal limits. No pericardial effusion. Negative visible aorta aside from atherosclerosis. Mediastinum/Nodes: Stable mediastinal lymph nodes since 2019, such as due to chronic or recurrent pulmonary inflammation. Lungs/Pleura: Lower lung volumes. Chronic pulmonary fibrosis. Major airways remain patent. New right lung pleural thickening or trace pleural effusion with superimposed rounded roughly 3 cm superior segment lower lobe opacity with some internal gas or cavitation (series 4, image 60), new since 2019. But stable lung opacity elsewhere. Upper Abdomen: Negative visible liver, spleen (calcified granulomas), pancreas, adrenal glands, kidneys and bowel in the upper abdomen. Musculoskeletal: Multilevel upper thoracic and lower cervical compression fractures appear stable. No acute osseous abnormality identified. Review of the MIP images confirms the above findings. IMPRESSION: 1. No evidence of acute pulmonary embolus. 2. Chronic Pulmonary Fibrosis with a new 3 cm rounded opacity in the superior segment right lower lobe since 2019. Superimposed new right lung pleural thickening or  trace pleural fluid. This is indeterminate for pneumonia versus mass. Recommend one of the following: (a) repeat chest CT in 3 months, (b) follow-up PET-CT, or (c) tissue sampling. This recommendation adapted from the consensus statement: Guidelines for Management of Incidental Pulmonary Nodules Detected on CT Images: From the Fleischner Society 2017; Radiology 2017; 284:228-243. 3.  CT Abdomen, and Pelvis today are reported separately. 4. Calcified coronary artery atherosclerosis. Aortic Atherosclerosis (ICD10-I70.0). Electronically Signed   By:  Genevie Ann M.D.   On: 01/03/2020 00:58   CT ABDOMEN PELVIS W CONTRAST  Result Date: 01/03/2020 CLINICAL DATA:  79 year old female with chest and abdominal pain. Right upper quadrant and flank pain since 0800 hours. EXAM: CT ABDOMEN AND PELVIS WITH CONTRAST TECHNIQUE: Multidetector CT imaging of the abdomen and pelvis was performed using the standard protocol following bolus administration of intravenous contrast. CONTRAST:  126m OMNIPAQUE IOHEXOL 350 MG/ML SOLN COMPARISON:  CTA chest today reported separately. CT Abdomen and Pelvis 06/18/2019 FINDINGS: Lower chest: On these images there is evidence of both pleural thickening with enhancement and intervening trace pleural fluid in the posterior right hemithorax (series 5, image 2), new since January. Some of the rounded right lower lobe superior segment opacity is also visible on these images, see chest CTA for additional details. Hepatobiliary: Chronically absent gallbladder. Intra and extrahepatic biliary ductal dilatation with CBD measuring up to 15 mm diameter, although this appears similar to the January CT. However, the CBD has enlarged since 2019 (up to 12 mm at that time. No filling defect identified within the duct. Liver enhancement remains within normal limits. Pancreas: Stable, negative.  No pancreatic ductal enlargement. Spleen: Stable calcified splenic granulomas. Adrenals/Urinary Tract: Normal adrenal glands. Both kidneys appear stable since 2019 with symmetric renal enhancement and contrast excretion. Normal proximal ureters. 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 There is abundant gas within the urinary bladder on series 5, image 79. Mild bladder wall thickening appears stable. No perivesical stranding is evident. Stomach/Bowel: Redundant large bowel with mild retained stool throughout. Diverticulosis of the sigmoid colon with no active inflammation evident.  Decompressed terminal ileum. Diminutive or absent appendix. No dilated small bowel. Decompressed stomach and duodenum. No free air, free fluid. Vascular/Lymphatic: Aortoiliac calcified atherosclerosis. Major arterial structures remain patent. Portal venous system is patent. No lymphadenopathy. Reproductive: Diminutive or absent as before. Other: Streak artifact in the pelvis, no pelvis free fluid. Musculoskeletal: Osteopenia. Chronic right hip arthroplasty. No acute osseous abnormality identified. IMPRESSION: 1. On these images there is evidence of pleural thickening and trace pleural fluid in the right lower lobe, new since January this year, and near the abnormal superior segment opacity described on Chest CTA today. This would seem to favor infection over tumor. 2. Chronic but increased intra- and extrahepatic biliary ductal dilatation. No filling defect in the CBD. If there is hyperbilirubinemia then follow-up MRCP and/or ERCP may be valuable. 3. Gas within the urinary bladder, suspicious for UTI unless explained by recent catheterization. 4. Other chronic findings but no other acute or inflammatory process in the abdomen or pelvis. Electronically Signed   By: HGenevie AnnM.D.   On: 01/03/2020 01:10   NM PET Image Initial (PI) Skull Base To Thigh  Result Date: 01/25/2020 CLINICAL DATA:  Initial treatment strategy for chest CT demonstrating right lower lobe pneumonia versus mass. EXAM: NUCLEAR MEDICINE PET SKULL BASE TO THIGH TECHNIQUE: 8.9 mCi F-18 FDG was injected intravenously.  Full-ring PET imaging was performed from the skull base to thigh after the radiotracer. CT data was obtained and used for attenuation correction and anatomic localization. Fasting blood glucose: 89 mg/dl COMPARISON:  CTA chest 01/03/2020.  Abdominopelvic CT 01/03/2020. FINDINGS: Mediastinal blood pool activity: SUV max 2.0 Liver activity: SUV max NA NECK: No areas of abnormal hypermetabolism. Incidental CT findings: No cervical  adenopathy. Bilateral carotid atherosclerosis. CHEST: Pleural-based right lower lobe partially cavitary lung mass persists. 3.3 cm and a S.U.V. max of 18.5, including on 93/4. Hypermetabolic adenopathy within the azygoesophageal recess. 1.4 cm and a S.U.V. max of 16.2 on 85/4. A low right paratracheal node measures 7 mm and a S.U.V. max of 2.7 on 81/4, equivocal. Incidental CT findings: Similar trace right pleural fluid or thickening. Cardiomegaly. Aortic and coronary artery atherosclerosis. Interstitial lung disease, suboptimally evaluated. Concurrent centrilobular emphysema. ABDOMEN/PELVIS: No abdominopelvic parenchymal or nodal hypermetabolism. Incidental CT findings: Deferred to recent diagnostic CT. 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 Abdominal aortic atherosclerosis. No acute superimposed process. Pelvic degradation secondary to right hip arthroplasty. SKELETON: No suspicious marrow hypermetabolism. Superficial to the proximal right femur is subcutaneous soft tissue thickening and mild hypermetabolism, including at a S.U.V. max of 3.4 on 256/4. Likely postoperative. Incidental CT findings: Osteopenia. Upper thoracic vertebral body height loss was present on 01/03/2020. IMPRESSION: 1. Persistent right lower lobe lung mass, consistent with primary bronchogenic carcinoma. 2. Hypermetabolic node within the azygoesophageal recess, consistent with nodal metastasis. A right paratracheal node is equivocal. 3. No evidence of hypermetabolic extrathoracic metastatic disease. Electronically Signed   By: Abigail Miyamoto M.D.   On: 01/25/2020 20:20   CT T-SPINE NO CHARGE  Result Date: 01/31/2020 CLINICAL DATA:  Lung cancer, right chest pain EXAM: CT THORACIC SPINE WITHOUT CONTRAST TECHNIQUE: Multidetector CT images of the thoracic were obtained using the standard protocol without intravenous contrast. COMPARISON:  None. Findings are correlated with PET CT examination of 01/25/2020 and concurrently performed CT  arteriogram of the chest FINDINGS: Alignment: Normal thoracic kyphosis.  No listhesis. Vertebrae: Mild anterior wedge compression fracture T1 and T3 are identified with approximately 40% and 20% loss of height, respectively. No acute fracture of the thoracic spine. There is no lytic or blastic bone lesion identified involving the vertebral bodies of the thoracic spine. There is, however, subtle erosion of the a right eighth rib just lateral to the a costovertebral junction, best seen on axial image # 67/11 and 67/10 in keeping with chest wall invasion and direct involvement of the a thoracic cage by the primary mass within the posterior basal right lower lobe. Paraspinal and other soft tissues: Posterior basal right lower lobe pleural based central necrotic pulmonary mass again identified. Mild subpleural pulmonary fibrosis again noted. Pathologic as ago esophageal recess noted. Multiple simple cortical cysts are seen within the visualized left kidney. Disc levels: Intervertebral disc height has been preserved though there are discogenic changes noted predominantly at T5-T11 in keeping with changes of mild to moderate degenerative disc disease. Review of the axial images demonstrates no significant canal stenosis. No significant neural foraminal narrowing. IMPRESSION: 1. No lytic or blastic bone lesion identified involving the vertebral bodies of the thoracic spine. 2. Mild anterior wedge compression fractures of T1 and T3 are identified with approximately 40% and 20% loss of height, respectively. These appear chronic in nature. 3. No significant canal stenosis or neural foraminal narrowing is seen within the thoracic spine. 4. Posterior basal right lower lobe pleural based central necrotic pulmonary mass  again identified. There is subtle erosion of the right eighth rib just lateral to the a costovertebral junction in keeping with chest wall invasion and direct involvement of the a thoracic cage by the primary mass  within the posterior basal right lower lobe. Electronically Signed   By: Fidela Salisbury MD   On: 01/31/2020 16:41   DG Hip Unilat W OR W/O Pelvis 2-3 Views Right  Result Date: 01/17/2020 CLINICAL DATA:  Right hip pain EXAM: DG HIP (WITH OR WITHOUT PELVIS) 2-3V RIGHT COMPARISON:  09/07/2019 FINDINGS: Changes of right hip replacement. No hardware complicating feature. No fracture, subluxation or dislocation IMPRESSION: No acute bony abnormality. Electronically Signed   By: Rolm Baptise M.D.   On: 01/17/2020 22:55    ASSESSMENT & PLAN:  1. Lung mass   2. History of deep vein thrombosis of lower extremity   3. History of pulmonary embolism   4. Anxiety    #Lung mass with mediastinal lymphadenopathy, hypermetabolic on PET scan. Discussed with patient about the possibility of primary lung cancer with nodal metastasis.  No extrathoracic metastases on PET scan.  Recommend patient to have MRI brain to complete staging. Recommend biopsy to establish tissue diagnosis.  Either CT-guided biopsy versus bronchoscopy biopsy.  I will discuss her case on this week's tumor board.  # History of PE and DVT Recommend patient to continue Eliquis 2.5 mg twice daily for maintenance anticoagulation. Ok to stop 2 days prior to biopsy and resume 24 hours after biopsy.   # Chronic anemia, hemoglobin is 10.7, same as last visit. Monitor.  #Anxiety, anxiety seems to be worse since she knows the abnormal findings.  Continue Xanax and Cymbalta and Remeron..   All questions were answered. The patient knows to call the clinic with any problems questions or concerns.  Return of visit: To be determined  Earlie Server, MD, PhD Hematology Oncology [place] [facility] 02/01/2020

## 2020-02-01 NOTE — Telephone Encounter (Signed)
[facility] Night - Client TELEPHONE ADVICE RECORD [facility] Patient Name: [name] Gender: Female DOB: [birth year] Age: 79 Y 44 M 28 D Return Phone Number: [phone] (Primary) Address: City/State/Zip: [place] [address] Client [facility] Night - Client Client Site [place] Physician [name]- NP Contact Type Call Who Is Calling Patient / Member / Family / Caregiver Call Type Triage / Clinical Caller Name [name] Relationship To Patient Daughter Return Phone Number [phone] (Primary) Chief Complaint CHEST PAIN (>=21 years) - pain, pressure, heaviness or tightness Reason for Call Symptomatic / Request for [place] states that her mother is in severe pain from her lung cancer. She is complaining of right lung pain that is going up her back. Translation No Nurse Assessment Nurse: Rock Nephew, RN, Juliann Pulse Date/Time (Eastern Time): 01/31/2020 10:53:51 AM Confirm and document reason for call. If symptomatic, describe symptoms. ---Caller states that her mother is in severe pain from her lung cancer. She is complaining of right lung pain that is going up her back. She has not seen her Oncologist yet for her first appt. Has the patient had close contact with a person known or suspected to have the novel coronavirus illness OR traveled / lives in area with major community spread (including international travel) in the last 14 days from the onset of symptoms? * If Asymptomatic, screen for exposure and travel within the last 14 days. ---No Does the patient have any new or worsening symptoms? ---Yes Will a triage be completed? ---Yes Related visit to physician within the last 2 weeks? ---Yes Does the PT have any chronic conditions? (i.e. diabetes, asthma, this includes High risk factors for pregnancy, etc.) ---Yes List chronic conditions. ---COPD, HTN Is this a  behavioral health or substance abuse call? ---No Guidelines Guideline Title Affirmed Question Affirmed Notes Nurse Date/Time (Eastern Time) Back Pain [1] SEVERE back pain (e.g., excruciating) AND [2] sudden onset AND [3] age > 66 years Rock Nephew, RN, Juliann Pulse 01/31/2020 10:54:52 AM PLEASE NOTE: All timestamps contained within this report are represented as [place] Standard Time. CONFIDENTIALTY NOTICE: This fax transmission is intended only for the addressee. It contains information that is legally privileged, confidential or otherwise protected from use or disclosure. If you are not the intended recipient, you are strictly prohibited from reviewing, disclosing, copying using or disseminating any of this information or taking any action in reliance on or regarding this information. If you have received this fax in error, please notify us immediately by telephone so that we can arrange for its return to [place]. Phone: [phone], Toll-Free: [phone], Fax: [phone] Page: 2 of 2 Call Id: [id number] Faith. Time [name] Time) Disposition Final User 01/31/2020 10:51:49 AM Send to Urgent Queue Ezequiel Kayser 01/31/2020 10:56:51 AM Go to ED Now Yes Rock Nephew, RN, Gara Kroner Disagree/Comply Comply Caller Understands Yes PreDisposition Call Doctor Care Advice Given Per Guideline GO TO ED NOW: * You need to be seen in the Emergency Department. ANOTHER ADULT SHOULD DRIVE: * It is better and safer if another adult drives instead of you. * Please bring a list of your current medicines when you go to the Emergency Department (ER). CARE ADVICE given per Back Pain (Adult) guideline. Referrals GO TO FACILITY UNDECIDED

## 2020-02-01 NOTE — Telephone Encounter (Signed)
ER notes reviewed.

## 2020-02-01 NOTE — Progress Notes (Signed)
Patient reports her right side flank pain is 10/10 and went to ER yesterday due to the pain.  Has chronic nausea that is worsening.  Also has chronic SOBr that is not worsening.

## 2020-02-01 NOTE — Telephone Encounter (Signed)
Per chart review tab pt seen at [facility] ED on 01/31/20 and appears pt has appt with oncology today.

## 2020-02-02 ENCOUNTER — Encounter: Payer: Self-pay | Admitting: Family Medicine

## 2020-02-02 LAB — BLOOD GAS, VENOUS
Acid-Base Excess: 5.8 mmol/L — ABNORMAL HIGH (ref 0.0–2.0)
Bicarbonate: 32.8 mmol/L — ABNORMAL HIGH (ref 20.0–28.0)
O2 Saturation: 10.3 %
Patient temperature: 37
pCO2, Ven: 58 mmHg (ref 44.0–60.0)
pH, Ven: 7.36 (ref 7.250–7.430)

## 2020-02-02 IMAGING — CT CT RENAL STONE PROTOCOL
2 of 4 series · 17 of 46 positions shown, 19 images · non-contrast
Comparison: None.

CLINICAL DATA: Left flank pain.

EXAM:
CT ABDOMEN AND PELVIS WITHOUT CONTRAST
TECHNIQUE: Multidetector CT imaging of the abdomen and pelvis was performed
following the standard protocol without IV contrast.

[Series 3: renal stone 5.0 · axial · 0.85mm/px · z∈[-391,+39]mm · 14 of 94 slices shown, 16 images]
[im 4/94  soft-tissue]
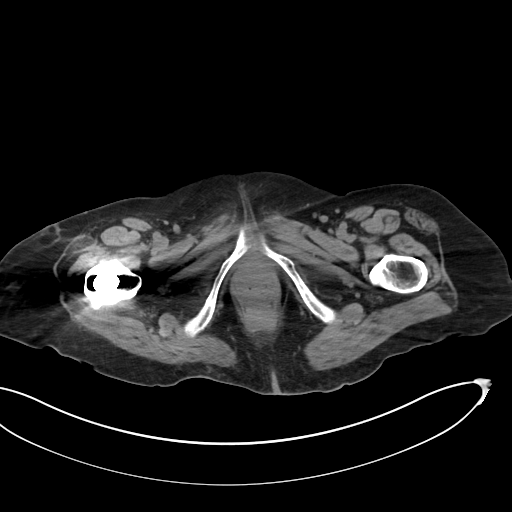
[im 4/94  bone]
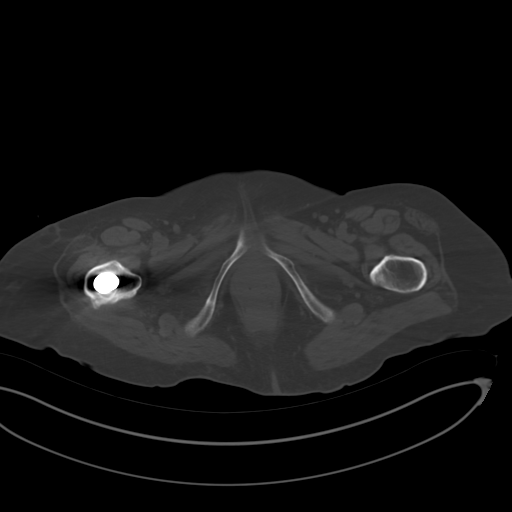
[im 12/94  soft-tissue]
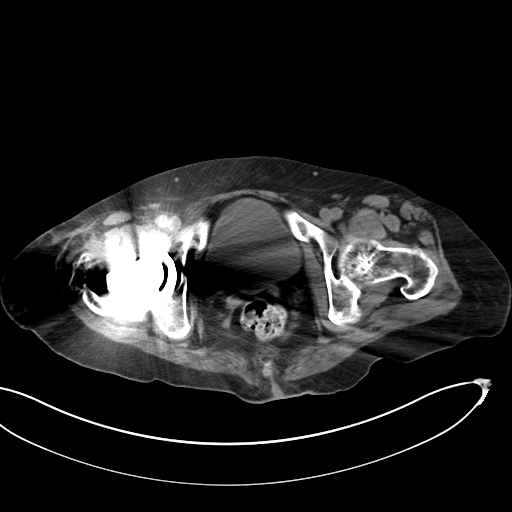
[im 19/94  soft-tissue]
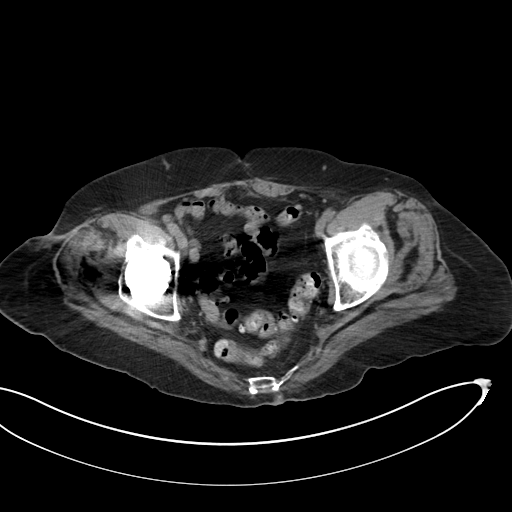
[im 27/94  soft-tissue]
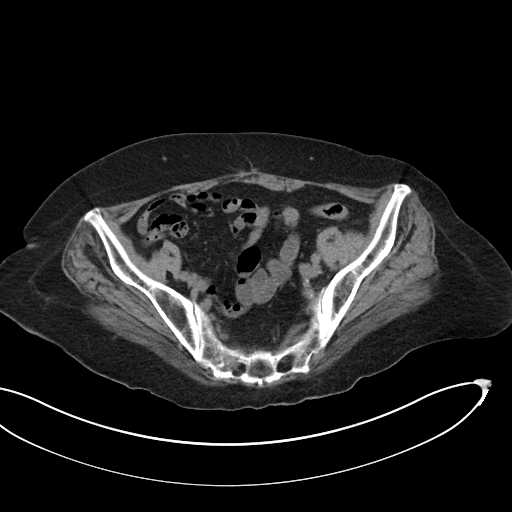
[im 30/94  soft-tissue]
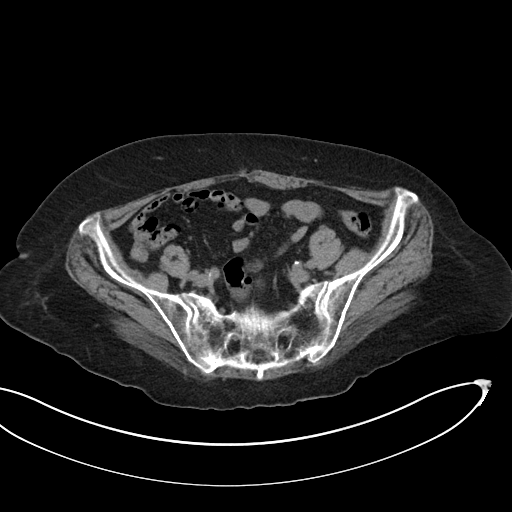
[im 38/94  soft-tissue]
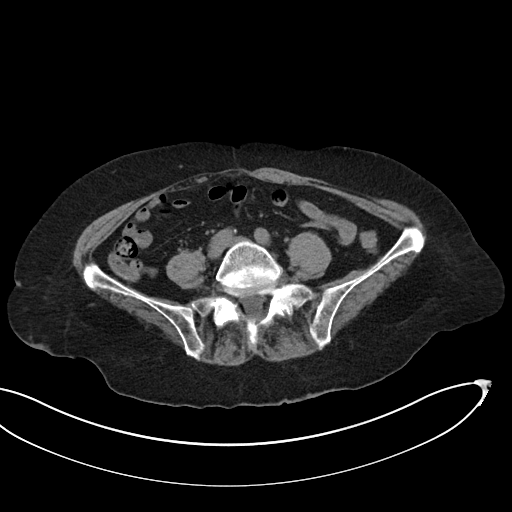
[im 45/94  soft-tissue]
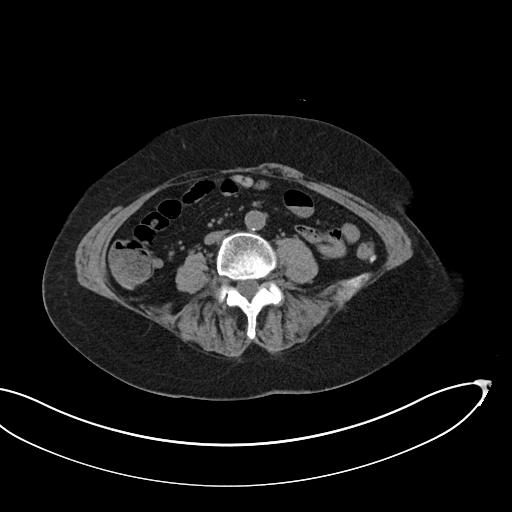
[im 49/94  soft-tissue]
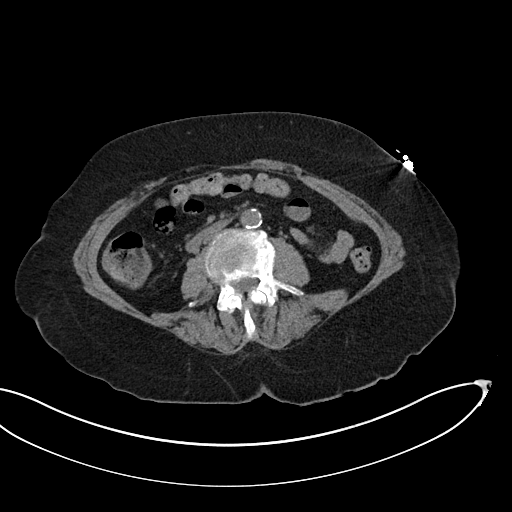
[im 56/94  soft-tissue]
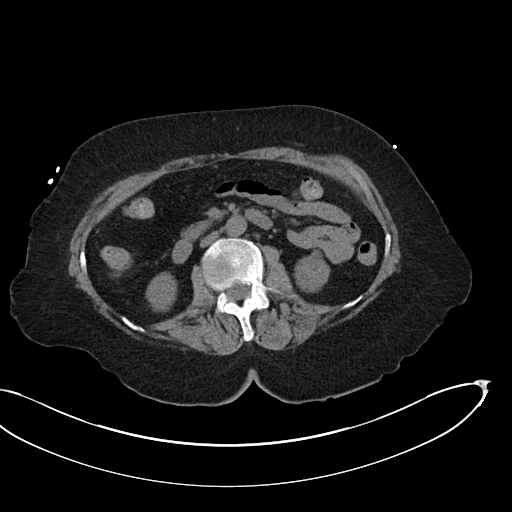
[im 56/94  bone]
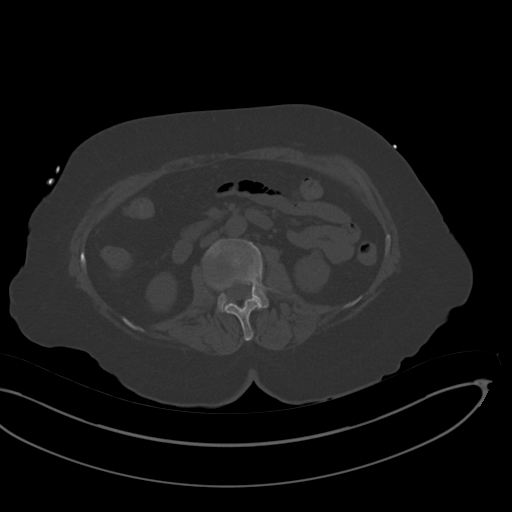
[im 64/94  soft-tissue]
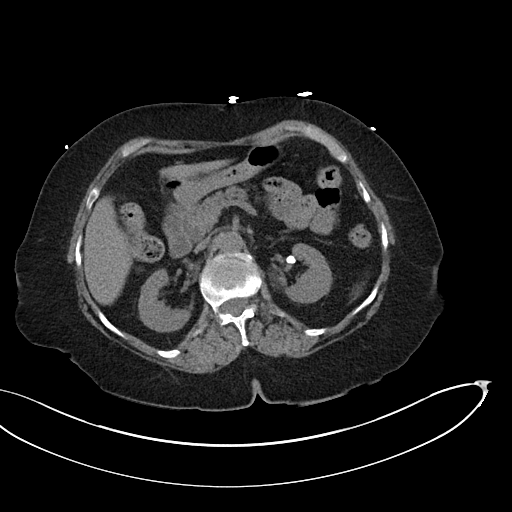
[im 71/94  soft-tissue]
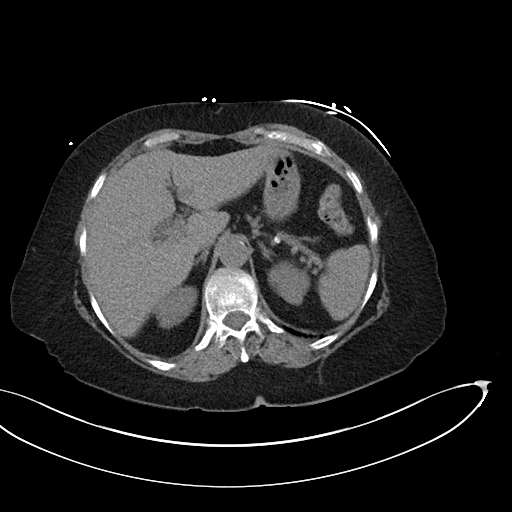
[im 75/94  soft-tissue]
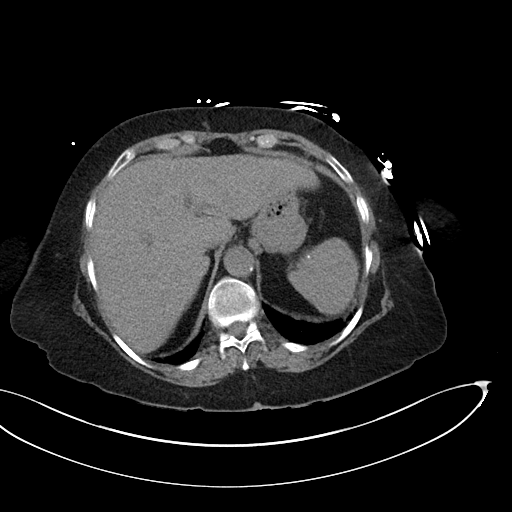
[im 82/94  soft-tissue]
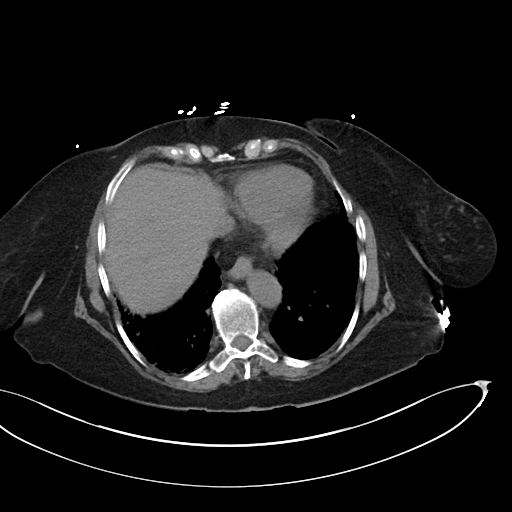
[im 90/94  soft-tissue]
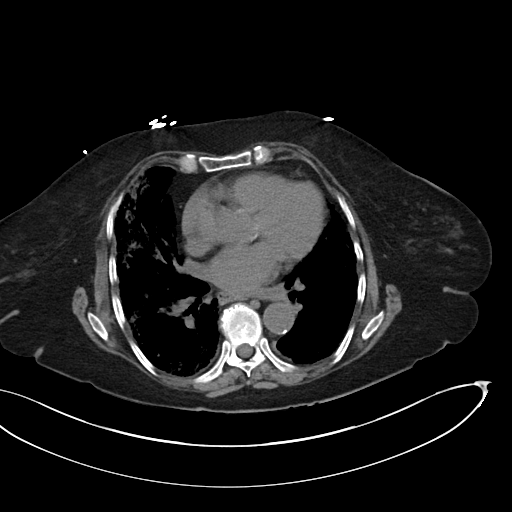

[Series 5: renal stone 3.0 cor · coronal · 1.05mm/px · 3 of 119 slices shown]
[im 40/119  soft-tissue]
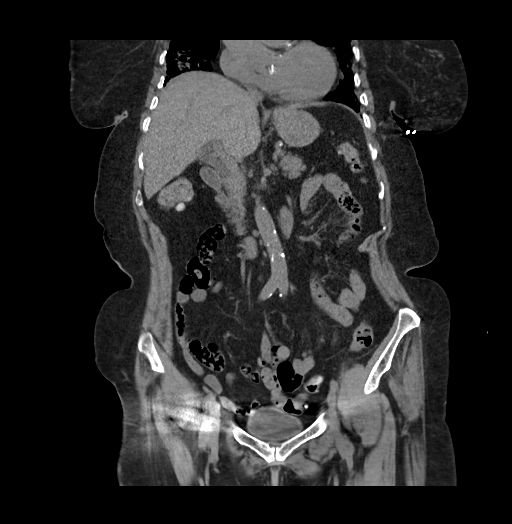
[im 53/119  soft-tissue]
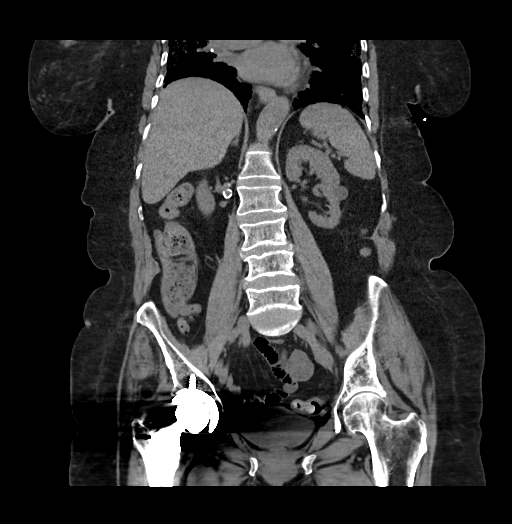
[im 66/119  soft-tissue]
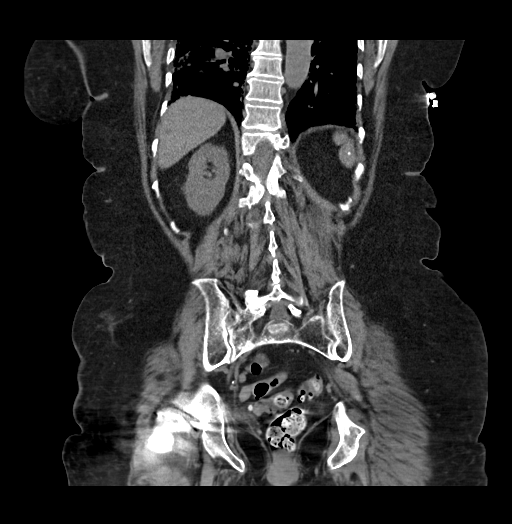

[17 of 46 positions shown; findings below may reference images not displayed]

FINDINGS: Lower chest: Patchy areas of ground-glass airspace consolidation
with associated interstitial thickening in bilateral lower lobes
with peripheral predominance. No pleural effusion or pneumothorax.
Enlarged heart. Calcific atherosclerotic disease of the coronary
arteries and aorta.

Hepatobiliary: No focal liver abnormality is seen. Status post
cholecystectomy. No biliary dilatation.

Pancreas: Unremarkable. No pancreatic ductal dilatation or
surrounding inflammatory changes.

Spleen: Normal in size without focal abnormality.

Adrenals/Urinary Tract: Normal adrenal glands. Left-sided
circumscribed hypoattenuated renal nodules measuring water density
likely represent cysts. No evidence of hydronephrosis or
nephrolithiasis. Vascular calcifications noted.

Stomach/Bowel: Stomach is within normal limits. Post appendectomy.
No evidence of bowel wall thickening, distention, or inflammatory
changes. Diffuse colonic diverticulosis without evidence of acute
diverticulitis.

Vascular/Lymphatic: Aortic atherosclerosis. No enlarged abdominal or
pelvic lymph nodes.

Reproductive: Status post hysterectomy. No adnexal masses.

Other: No abdominal wall hernia or abnormality. No abdominopelvic
ascites.

Musculoskeletal: Spondylosis of the lumbosacral spine.
IMPRESSION: No evidence of nephrolithiasis or hydronephrosis.

Diffuse colonic diverticulosis without evidence of acute
diverticulitis.

Patchy areas of ground-glass attenuation of the lower lobes of the
lungs with associated interstitial thickening. Findings may
represent atypical pneumonia.

## 2020-02-02 IMAGING — DX DG CHEST 2V
2 series · 2 of 2 positions shown · non-contrast
Comparison: 04/22/2018

CLINICAL DATA: Left-sided chest pain and shortness of breath.

EXAM:
CHEST - 2 VIEW

[x chest ap]
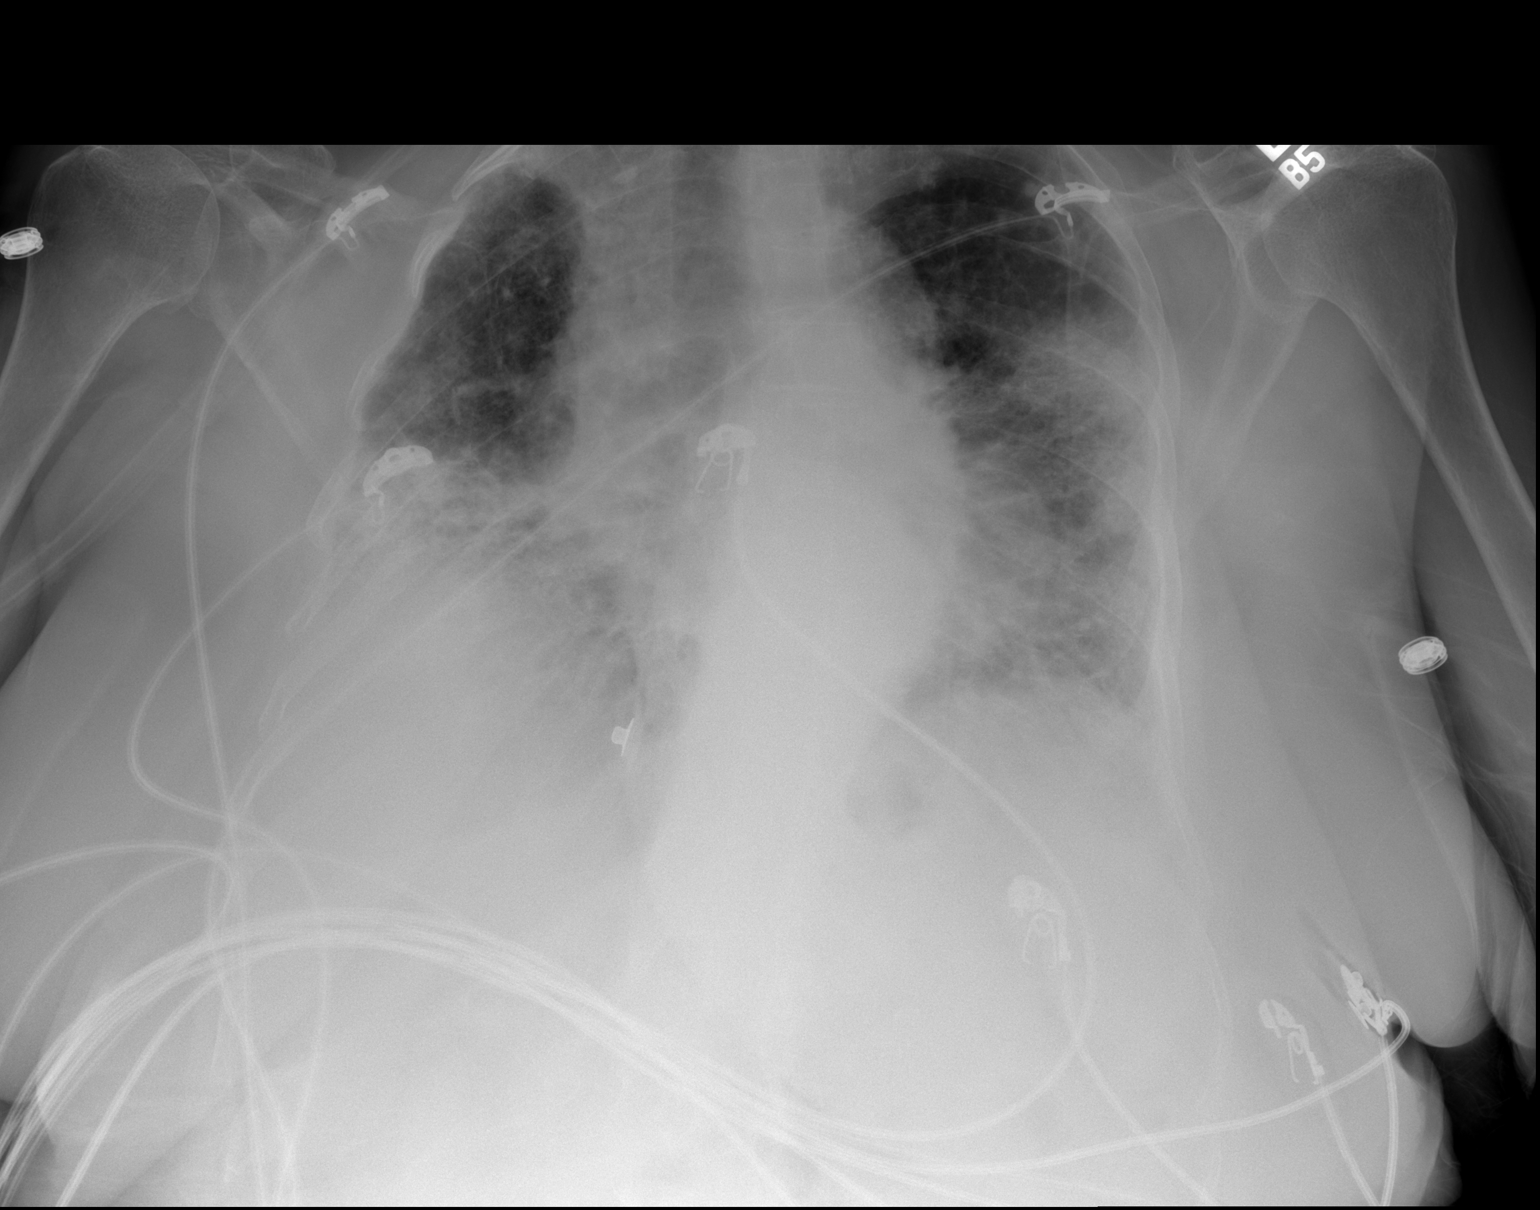

[w chest lat]
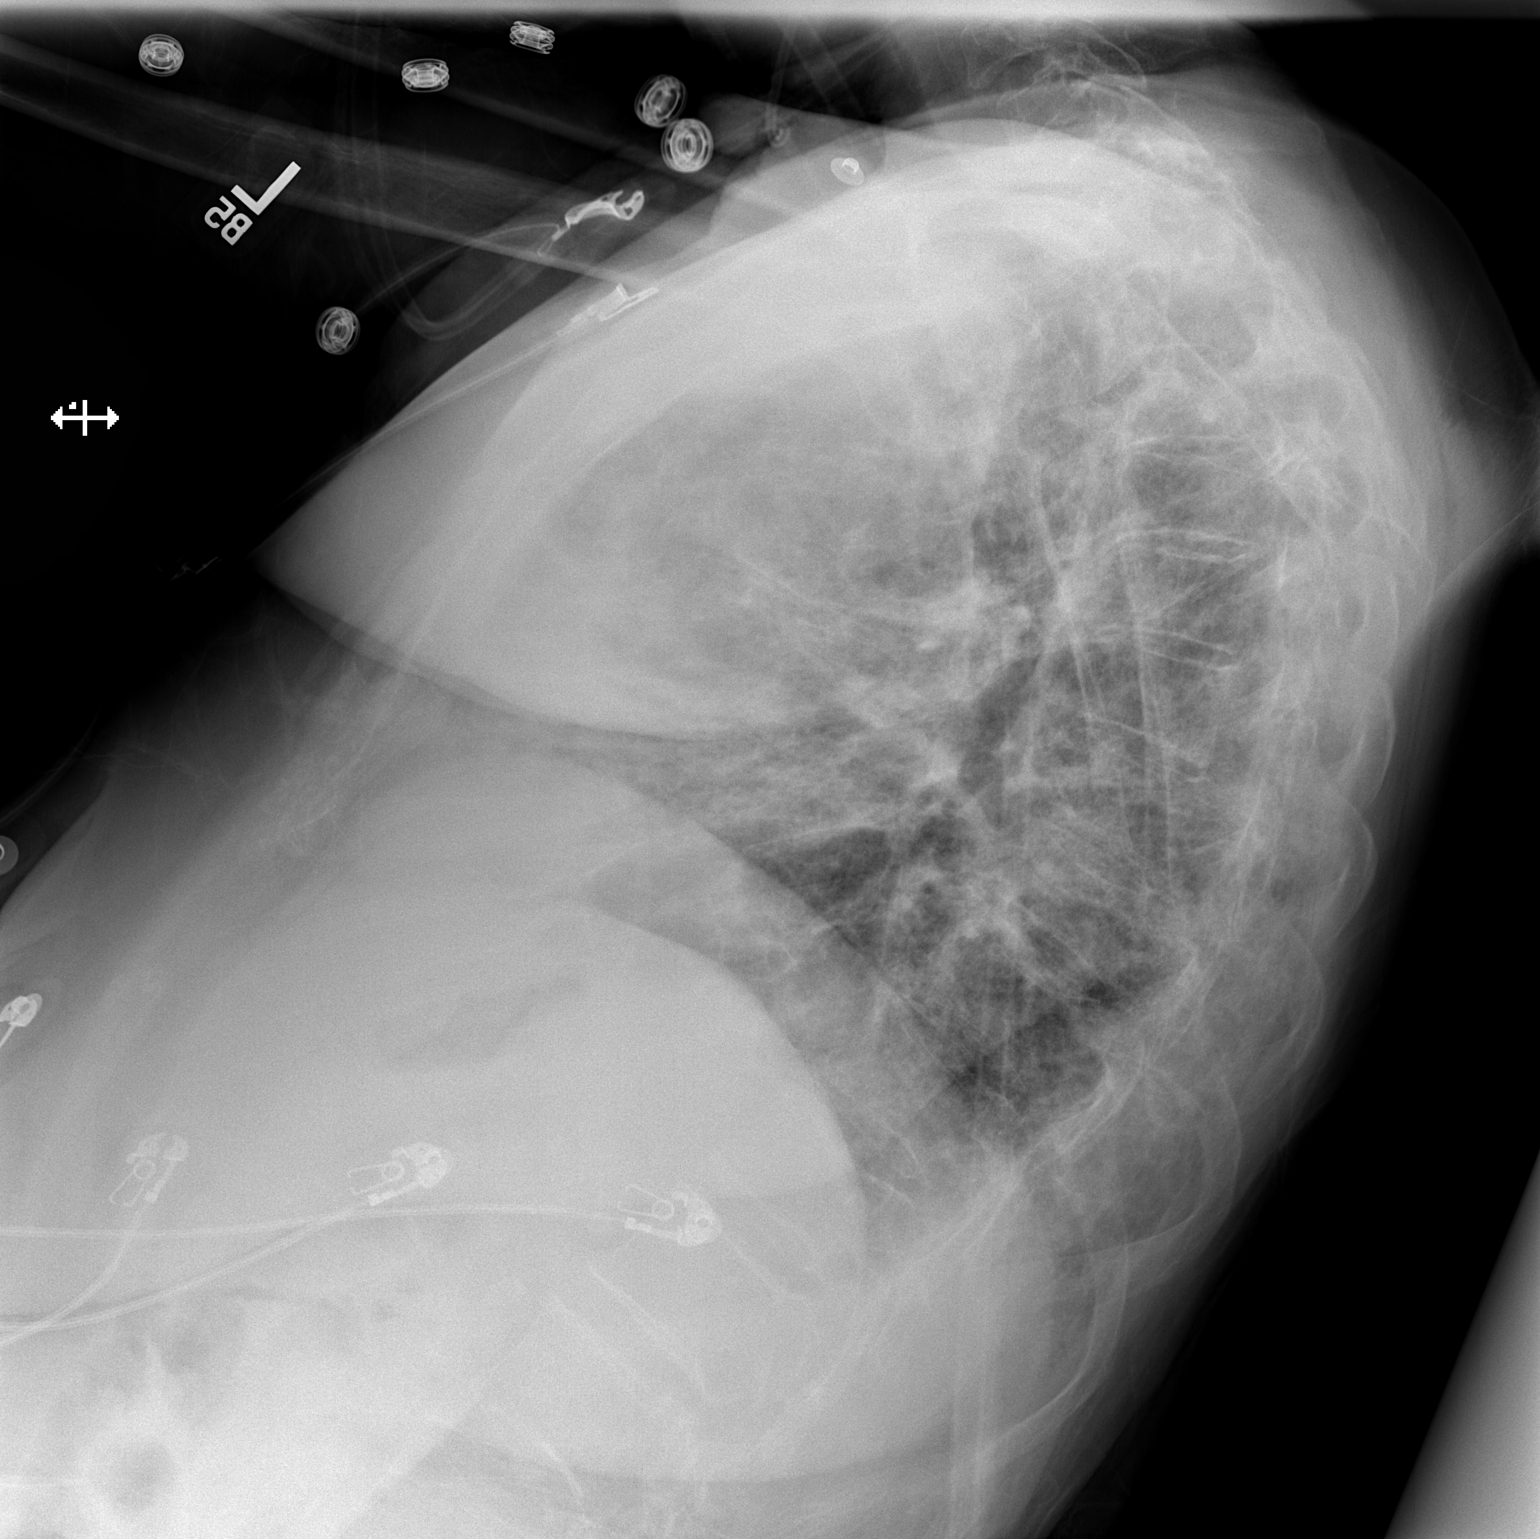

[2 of 2 positions shown; findings below may reference images not displayed]

FINDINGS: Cardiomediastinal silhouette is normal. Mediastinal contours appear
intact. Tortuosity and calcific atherosclerotic disease of the
aorta.

There is no evidence of pleural effusion or pneumothorax. Bilateral
patchy airspace consolidation with peripheral predominance with
coarsening of the interstitial markings.

Osseous structures are without acute abnormality. Soft tissues are
grossly normal.
IMPRESSION: Bilateral patchy airspace consolidation with peripheral predominance
with coarsening of the interstitial markings. The aeration of the
lungs has worsened when compared to the prior radiographs.

## 2020-02-02 IMAGING — CT CT ANGIO CHEST
2 of 6 series · 18 of 36 positions shown · IV contrast (iopamidol)
Comparison: None.

CLINICAL DATA: Pulmonary embolus suspected. High probability.
Left-sided rib pain. To kidney a and tachycardia.

EXAM:
CT ANGIOGRAPHY CHEST WITH CONTRAST
TECHNIQUE: Multidetector CT imaging of the chest was performed using the
standard protocol during bolus administration of intravenous
contrast. Multiplanar CT image reconstructions and MIPs were
obtained to evaluate the vascular anatomy.
CONTRAST:  75mL EDYJWF-7RY IOPAMIDOL (EDYJWF-7RY) INJECTION 76%

[Series 7: pe thins · axial · 0.84mm/px · z∈[+39,+326]mm · 17 of 456 slices shown]
[im 23/456  lung]
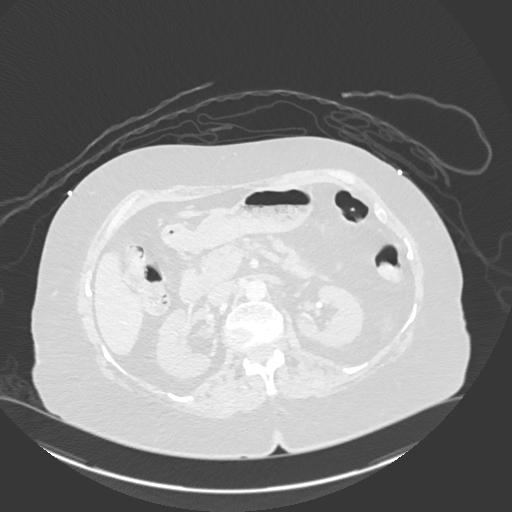
[im 46/456  mediastinal]
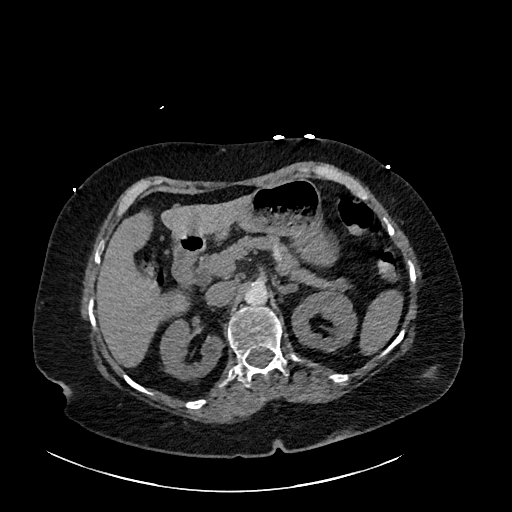
[im 69/456  lung]
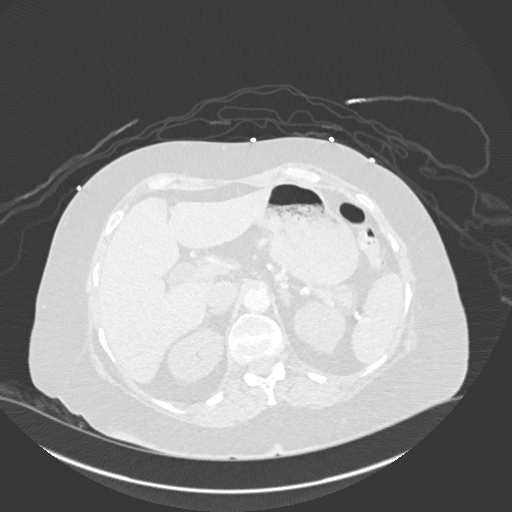
[im 92/456  mediastinal]
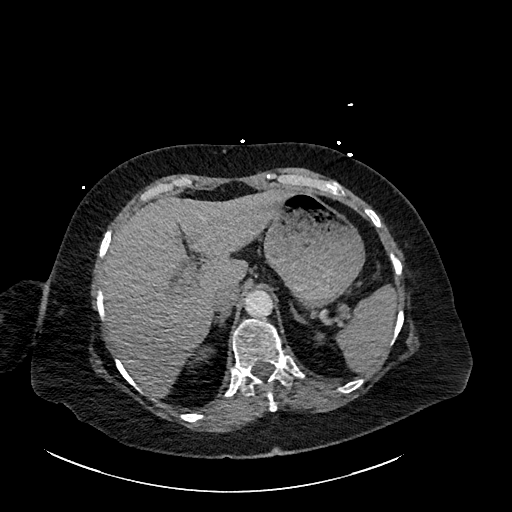
[im 137/456  lung]
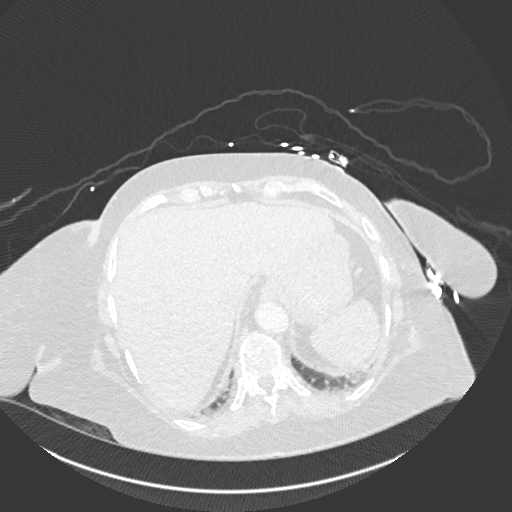
[im 160/456  mediastinal]
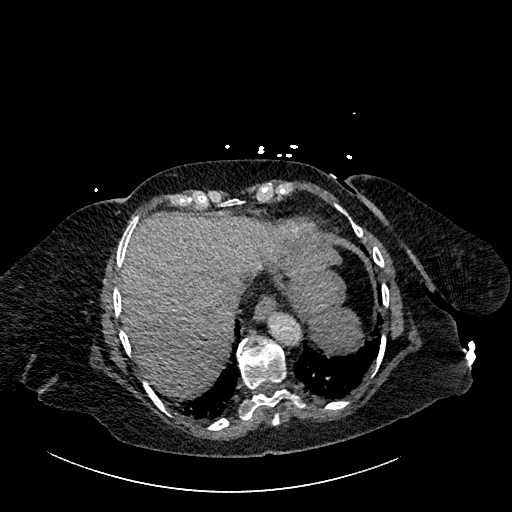
[im 183/456  lung]
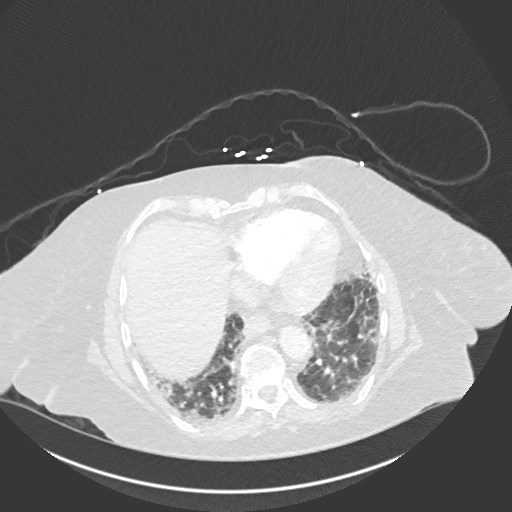
[im 205/456  mediastinal]
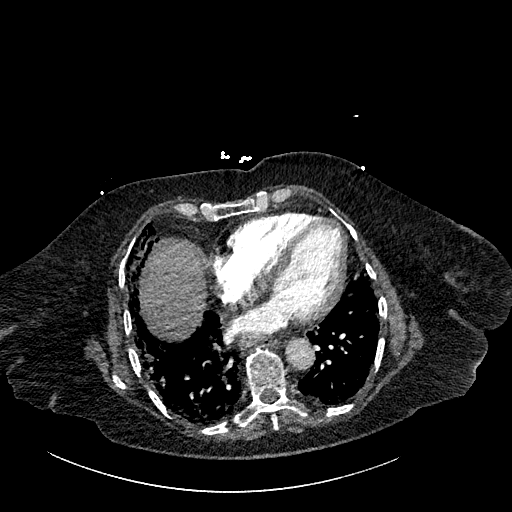
[im 228/456  lung]
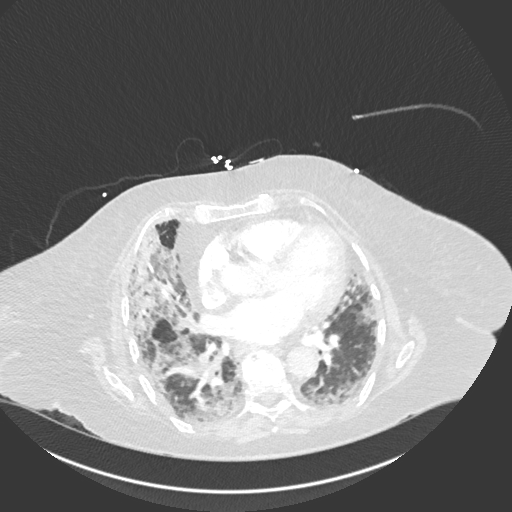
[im 251/456  mediastinal]
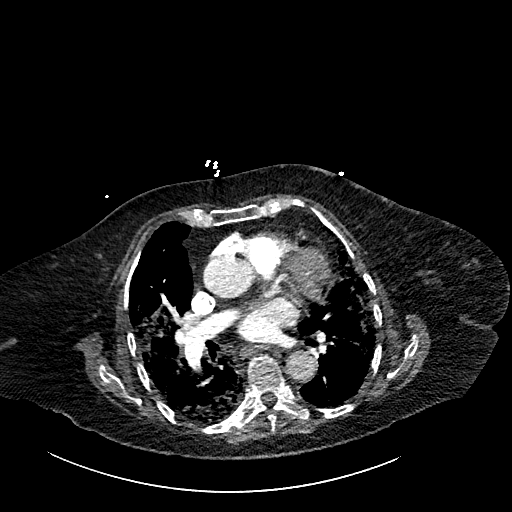
[im 274/456  lung]
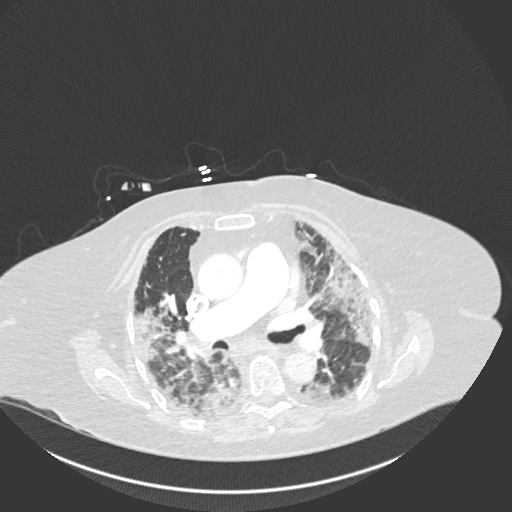
[im 296/456  mediastinal]
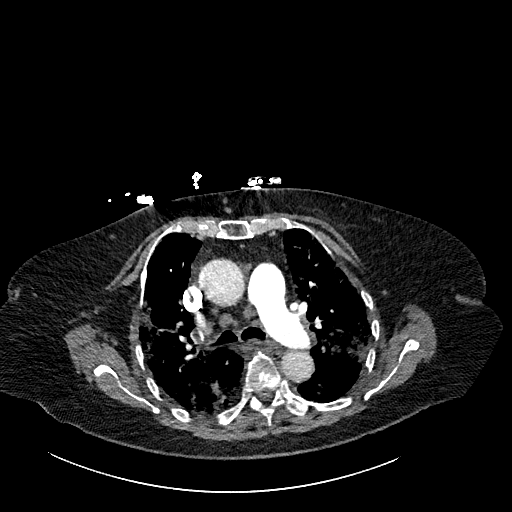
[im 319/456  lung]
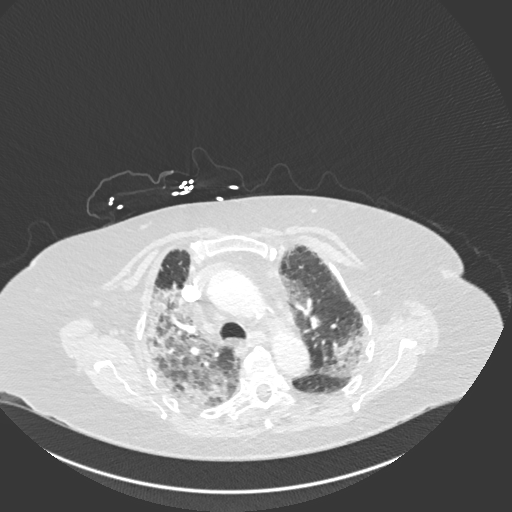
[im 365/456  mediastinal]
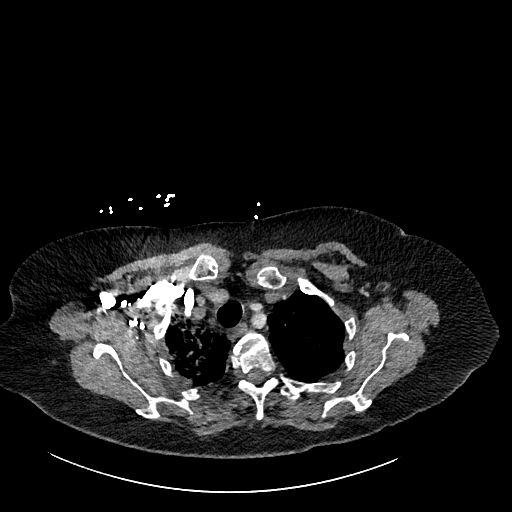
[im 387/456  lung]
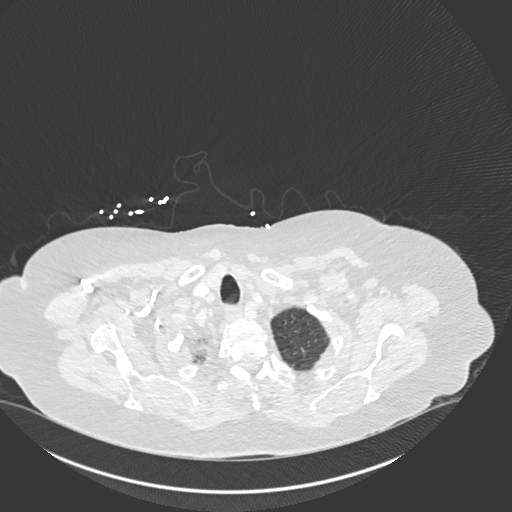
[im 410/456  mediastinal]
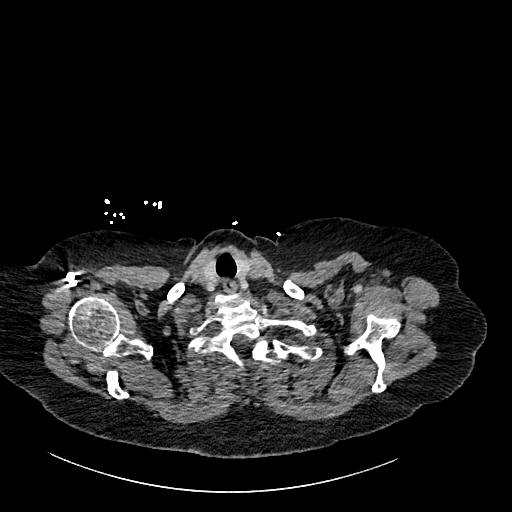
[im 433/456  lung]
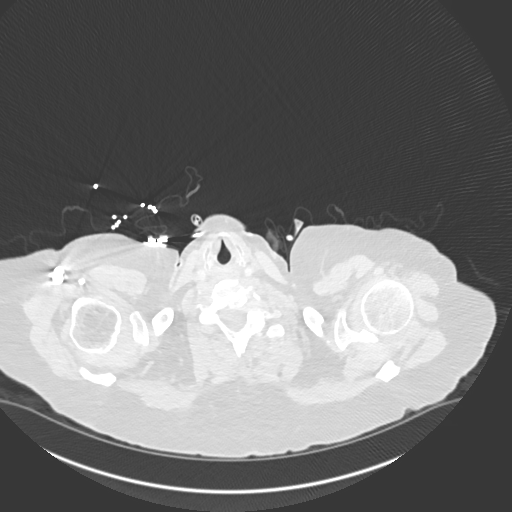

[Series 8: pe 2mm cor · coronal · 0.62mm/px · 1 of 151 slices shown]
[im 76/151  mediastinal]
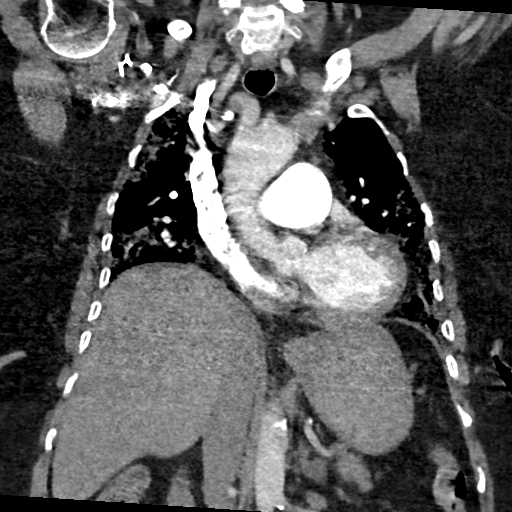

[18 of 36 positions shown; findings below may reference images not displayed]

FINDINGS: Cardiovascular: Heart size is normal. No pericardial effusion.
Aortic atherosclerosis. Calcification in the left main coronary
artery and LAD noted.

The main pulmonary artery measures 3.6 cm in transverse diameter.
There is a filling defect identified within the right lower lobe
lobar pulmonary artery, image 209/7. There is a filling defect
within the right upper lobe lobar pulmonary artery, image 90/10.
Complete occlusion scratch set segmental pulmonary artery filling
defect to a the left upper lobe is identified, image 53/5. There is
complete occlusion of the segmental pulmonary artery to the lingula.

The RV to LV ratio is equal to 5.1/4.4 equals 1.15.

Mediastinum/Nodes: Normal appearance of the thyroid gland. The
trachea appears patent and is midline. Normal appearance of the
esophagus. No enlarged axillary or supraclavicular lymph nodes.
Prominent mediastinal lymph nodes identified without adenopathy.

Lungs/Pleura: Extensive bilateral patchy areas of airspace
consolidation and ground-glass attenuation throughout both lungs. No
pleural effusion identified. Calcified granuloma within the left
apex.

Upper Abdomen: No acute abnormality identified.

Musculoskeletal: No chest wall abnormality. No acute or significant
osseous findings. Unchanged T2 compression deformity.

Review of the MIP images confirms the above findings.
IMPRESSION: 1. Positive for acute PE with CT evidence of right heart strain
(RV/LV Ratio = 1.15) consistent with at least submassive
(intermediate risk) PE. The presence of right heart strain has been
associated with an increased risk of morbidity and mortality. Please
activate Code PE by paging [phone].
2. Extensive bilateral patchy areas of ground-glass attenuation and
airspace consolidation which may represent areas of alveolar edema,
hemorrhage and/or infection.
3.  Aortic Atherosclerosis ([id number].W).
4. Critical Value/emergent results were called by telephone at the
time of interpretation on 04/30/2018 at [DATE] to Dr. LEMONDELON KIFLI
, who verbally acknowledged these results.

## 2020-02-03 ENCOUNTER — Encounter: Payer: Self-pay | Admitting: Family Medicine

## 2020-02-03 ENCOUNTER — Encounter: Payer: Self-pay | Admitting: Emergency Medicine

## 2020-02-03 ENCOUNTER — Emergency Department
Admission: EM | Admit: 2020-02-03 | Discharge: 2020-02-04 | Disposition: A | Discharge status: Home / Self Care | Payer: Medicare Other | Attending: Emergency Medicine | Admitting: Emergency Medicine

## 2020-02-03 ENCOUNTER — Other Ambulatory Visit: Payer: Self-pay

## 2020-02-03 ENCOUNTER — Emergency Department: Payer: Medicare Other

## 2020-02-03 DIAGNOSIS — Z79899 Other long term (current) drug therapy: Secondary | ICD-10-CM | POA: Diagnosis not present

## 2020-02-03 DIAGNOSIS — J449 Chronic obstructive pulmonary disease, unspecified: Secondary | ICD-10-CM | POA: Insufficient documentation

## 2020-02-03 DIAGNOSIS — C3490 Malignant neoplasm of unspecified part of unspecified bronchus or lung: Secondary | ICD-10-CM | POA: Diagnosis not present

## 2020-02-03 DIAGNOSIS — Z743 Need for continuous supervision: Secondary | ICD-10-CM | POA: Diagnosis not present

## 2020-02-03 DIAGNOSIS — G893 Neoplasm related pain (acute) (chronic): Secondary | ICD-10-CM | POA: Insufficient documentation

## 2020-02-03 DIAGNOSIS — Z96653 Presence of artificial knee joint, bilateral: Secondary | ICD-10-CM | POA: Insufficient documentation

## 2020-02-03 DIAGNOSIS — I499 Cardiac arrhythmia, unspecified: Secondary | ICD-10-CM | POA: Diagnosis not present

## 2020-02-03 DIAGNOSIS — Z96643 Presence of artificial hip joint, bilateral: Secondary | ICD-10-CM | POA: Diagnosis not present

## 2020-02-03 DIAGNOSIS — I10 Essential (primary) hypertension: Secondary | ICD-10-CM | POA: Diagnosis not present

## 2020-02-03 DIAGNOSIS — R0789 Other chest pain: Secondary | ICD-10-CM | POA: Insufficient documentation

## 2020-02-03 DIAGNOSIS — R05 Cough: Secondary | ICD-10-CM | POA: Diagnosis not present

## 2020-02-03 DIAGNOSIS — R6889 Other general symptoms and signs: Secondary | ICD-10-CM | POA: Diagnosis not present

## 2020-02-03 DIAGNOSIS — I129 Hypertensive chronic kidney disease with stage 1 through stage 4 chronic kidney disease, or unspecified chronic kidney disease: Secondary | ICD-10-CM | POA: Diagnosis not present

## 2020-02-03 DIAGNOSIS — N183 Chronic kidney disease, stage 3 unspecified: Secondary | ICD-10-CM | POA: Insufficient documentation

## 2020-02-03 DIAGNOSIS — N3000 Acute cystitis without hematuria: Secondary | ICD-10-CM | POA: Diagnosis not present

## 2020-02-03 DIAGNOSIS — R0602 Shortness of breath: Secondary | ICD-10-CM | POA: Diagnosis not present

## 2020-02-03 DIAGNOSIS — R079 Chest pain, unspecified: Secondary | ICD-10-CM | POA: Diagnosis not present

## 2020-02-03 DIAGNOSIS — R000 Tachycardia, unspecified: Secondary | ICD-10-CM | POA: Diagnosis not present

## 2020-02-03 LAB — CBC
HCT: 34.1 % — ABNORMAL LOW (ref 36.0–46.0)
Hemoglobin: 10.3 g/dL — ABNORMAL LOW (ref 12.0–15.0)
MCH: 25.1 pg — ABNORMAL LOW (ref 26.0–34.0)
MCHC: 30.2 g/dL (ref 30.0–36.0)
MCV: 83.2 fL (ref 80.0–100.0)
Platelets: 363 10*3/uL (ref 150–400)
RBC: 4.1 MIL/uL (ref 3.87–5.11)
RDW: 19 % — ABNORMAL HIGH (ref 11.5–15.5)
WBC: 7.2 10*3/uL (ref 4.0–10.5)
nRBC: 0 % (ref 0.0–0.2)

## 2020-02-03 LAB — BASIC METABOLIC PANEL
Anion gap: 12 (ref 5–15)
BUN: 14 mg/dL (ref 8–23)
CO2: 26 mmol/L (ref 22–32)
Calcium: 9.3 mg/dL (ref 8.9–10.3)
Chloride: 101 mmol/L (ref 98–111)
Creatinine, Ser: 0.96 mg/dL (ref 0.44–1.00)
GFR calc Af Amer: >60 mL/min (ref >60)
GFR calc non Af Amer: 57 mL/min — ABNORMAL LOW (ref >60)
Glucose, Bld: 111 mg/dL — ABNORMAL HIGH (ref 70–99)
Potassium: 3.1 mmol/L — ABNORMAL LOW (ref 3.5–5.1)
Sodium: 139 mmol/L (ref 135–145)

## 2020-02-03 LAB — HEPATIC FUNCTION PANEL
ALT: 16 U/L (ref 0–44)
AST: 25 U/L (ref 15–41)
Albumin: 2.8 g/dL — ABNORMAL LOW (ref 3.5–5.0)
Alkaline Phosphatase: 103 U/L (ref 38–126)
Bilirubin, Direct: <0.1 mg/dL (ref 0.0–0.2)
Total Bilirubin: 0.4 mg/dL (ref 0.3–1.2)
Total Protein: 7.6 g/dL (ref 6.5–8.1)

## 2020-02-03 LAB — LIPASE, BLOOD: Lipase: 20 U/L (ref 11–51)

## 2020-02-03 LAB — TROPONIN I (HIGH SENSITIVITY): Troponin I (High Sensitivity): 5 ng/L (ref <18)

## 2020-02-03 LAB — PROTIME-INR
INR: 1.1 (ref 0.8–1.2)
Prothrombin Time: 14.1 seconds (ref 11.4–15.2)

## 2020-02-03 MED ORDER — MORPHINE SULFATE (PF) 4 MG/ML IV SOLN
4.0000 mg | Freq: Once | INTRAVENOUS | Status: AC
  Administered 2020-02-03: 4 mg via INTRAVENOUS
  Filled 2020-02-03: qty 1

## 2020-02-03 MED ORDER — KETOROLAC TROMETHAMINE 30 MG/ML IJ SOLN
15.0000 mg | Freq: Once | INTRAMUSCULAR | Status: AC
  Administered 2020-02-03: 15 mg via INTRAVENOUS
  Filled 2020-02-03: qty 1

## 2020-02-03 MED ORDER — ACETAMINOPHEN 500 MG PO TABS: 1000.0000 mg | ORAL_TABLET | Freq: Three times a day (TID) | ORAL | Status: AC

## 2020-02-03 MED ORDER — LIDOCAINE 5 % EX PTCH: 1.0000 | MEDICATED_PATCH | Freq: Two times a day (BID) | CUTANEOUS | 0 refills | Status: AC

## 2020-02-03 MED ORDER — LIDOCAINE 5 % EX PTCH
1.0000 | MEDICATED_PATCH | CUTANEOUS | Status: DC
  Administered 2020-02-03: 1 via TRANSDERMAL
  Filled 2020-02-03: qty 1

## 2020-02-03 MED ORDER — AMLODIPINE BESYLATE 5 MG PO TABS
5.0000 mg | ORAL_TABLET | Freq: Once | ORAL | Status: AC
  Administered 2020-02-03: 5 mg via ORAL
  Filled 2020-02-03: qty 1

## 2020-02-03 MED ORDER — ONDANSETRON HCL 4 MG/2ML IJ SOLN
4.0000 mg | Freq: Once | INTRAMUSCULAR | Status: AC
  Administered 2020-02-03: 4 mg via INTRAVENOUS
  Filled 2020-02-03: qty 2

## 2020-02-03 MED ORDER — DICLOFENAC SODIUM 1 % EX GEL: 4.0000 g | Freq: Three times a day (TID) | CUTANEOUS | 0 refills | Status: DC

## 2020-02-03 MED ORDER — ACETAMINOPHEN 500 MG PO TABS
1000.0000 mg | ORAL_TABLET | Freq: Once | ORAL | Status: AC
  Administered 2020-02-03: 1000 mg via ORAL
  Filled 2020-02-03: qty 2

## 2020-02-03 MED ORDER — PREGABALIN 50 MG PO CAPS
100.0000 mg | ORAL_CAPSULE | Freq: Once | ORAL | Status: AC
  Administered 2020-02-03: 100 mg via ORAL
  Filled 2020-02-03: qty 2

## 2020-02-03 MED ORDER — OXYCODONE HCL 5 MG PO TABS
5.0000 mg | ORAL_TABLET | Freq: Once | ORAL | Status: AC
  Administered 2020-02-03: 5 mg via ORAL
  Filled 2020-02-03: qty 1

## 2020-02-03 NOTE — ED Provider Notes (Addendum)
[facility] Emergency Department Provider Note  ____________________________________________   First MD Initiated Contact with Patient 02/03/20 2048     (approximate)  I have reviewed the triage vital signs and the nursing notes.   HISTORY  Chief Complaint Chest Pain    HPI [name] is a 79 y.o. female  With PMHx below including known cavitary lung CA RLL here with ongoing R sided chest pain. Pt was just seen on 9/6 for similar sx, had extensive w/u including neg CT Angio. Has been seen by her oncologist as well. She states she is back today 2/2 ongoing significant right lower lateral chest wall pain. It is worse w/ movement and palpation. No alleviating factors. Pain is not worse w/ inspiration. She's had associated nausea but this has been an ongoing, chronic issue that is well documented. No overt RUQ abd pain. NO fever, chills. Reports that she tried her home oxycodone w/o significant relief.        Past Medical History:  Diagnosis Date  . Acute postoperative pain 02/03/2018  . Anemia   . B12 deficiency   . Bacteremia 10/07/2014  . Bladder incontinence   . Blind left eye   . Cancer ([place])    skin cancer   . Cataract   . Cervical spine fracture ([place])   . Chest pain 07/29/2013  . Compression fracture    C7,  upper T spine -compression fx  . COPD (chronic obstructive pulmonary disease) ([place])   . DDD (degenerative disc disease), lumbar   . Depression    major  . Diffuse myofascial pain syndrome 03/24/2015  . Dyspnea    at times- when activty  . Fever 10/05/2014  . GERD (gastroesophageal reflux disease)   . Hiatal hernia   . Hyperlipidemia   . Hypertension   . 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 Macular degeneration   . On home oxygen therapy    "3L; all the time" (04/30/2018)  . Osteoarthritis   . Osteoporosis   . Pneumonia    years ago  . Pulmonary embolism ([place]) 04/30/2018  . Reactive airway disease   . Rupture of  bowel ([place])   . Sepsis ([place]) 10/08/2014  . Stroke [facility])    "mini stroke"- balance and memory issue  . Stroke ([place])   . Wrist pain, acute 09/10/2012    Patient Active Problem List   Diagnosis Date Noted  . Pulmonary emphysema ([place]) 11/11/2019  . Acute cystitis 07/29/2019  . Non-intractable vomiting 05/19/2019  . Anemia 05/04/2019  . History of deep vein thrombosis of lower extremity 01/05/2019  . GI bleed 10/29/2018  . ILD (interstitial lung disease) ([place]) 08/12/2018  . COPD exacerbation ([place]) 05/17/2018  . Pressure injury of skin 05/04/2018  . PE (pulmonary thromboembolism) ([place]) 04/30/2018  . Acute metabolic encephalopathy   . Respiratory failure with hypoxia ([place]) 04/20/2018  . CAP (community acquired pneumonia) 04/20/2018  . Acute lower UTI 04/20/2018  . Chronic lower extremity pain (Right) 04/07/2018  . Abnormal MRI, lumbar spine 04/07/2018  . Lumbar facet hypertrophy (Multilevel) (Bilateral) 04/07/2018  . Lumbar foraminal stenosis (L3-4, L4-5) (Bilateral) 04/07/2018  . Annular tear of lumbar disc (L3-4, L4-5) 04/07/2018  . Closed hip fracture, sequela (Right) 02/03/2018  . It band syndrome, right 12/18/2017  . History of hip replacement (Right) 12/18/2017  . Ileus ([place]) 10/27/2017  . Primary osteoarthritis of hip 09/30/2017  . History of small bowel obstruction 09/10/2017  . Delayed union of closed fracture of hip,  right 09/10/2017  . SBO (small bowel obstruction) ([place]) 08/24/2017  . Abnormal x-ray of pelvis 08/13/2017  . Other specified dorsopathies, sacral and sacrococcygeal region 08/04/2017  . Spondylosis without myelopathy or radiculopathy, lumbosacral region 07/17/2017  . Non-traumatic compression fracture of T3 thoracic vertebra, sequela 07/02/2017  . High risk medication use 07/02/2017  . At high risk for falls 07/02/2017  . Chronic sacroiliac joint pain (Right) 07/02/2017  . CKD (chronic kidney disease) stage 3, GFR 30-59 ml/min 04/23/2017  . Chronic knee pain  s/p total knee replacement (TKR) (Right) 04/02/2017  . DDD (degenerative disc disease), lumbar 03/05/2017  . Chronic knee pain (Bilateral) (R>L) 03/05/2017  . Osteoarthritis 03/05/2017  . Chronic musculoskeletal pain 03/05/2017  . Neurogenic pain 03/05/2017  . Chronic myofascial pain 02/12/2017  . Lumbar facet syndrome (Bilateral) (L>R) 02/12/2017  . Long term prescription benzodiazepine use 02/12/2017  . Disorder of skeletal system 02/12/2017  . Pharmacologic therapy 02/12/2017  . Problems influencing health status 02/12/2017  . 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 NSAID long-term use 02/12/2017  . DDD (degenerative disc disease), cervical 11/11/2016  . Lumbar facet arthropathy (Bilateral) 11/11/2016  . 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 CAFL (chronic airflow limitation) ([place]) 11/07/2016  . Depression, major, in partial remission ([place]) 11/07/2016  . 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 Long term current use of opiate analgesic 11/07/2016  . Long term prescription opiate use 11/07/2016  . Chronic pain syndrome 11/07/2016  . Chronic neck pain (Secondary Area of Pain) (Bilateral) (L>R) 11/07/2016  . Chronic hip pain (Right) 11/07/2016  . Age-related osteoporosis with current pathological fracture with routine healing 09/20/2016  . History of stroke 09/20/2016  . Intertrochanteric fracture of femur, sequela (Right) 09/13/2016  . Chronic shoulder pain (Bilateral) 08/12/2016  . Hx of total knee replacement (Bilateral) 04/04/2016  . Chronic shoulder pain (Left) 02/28/2016  . Hoarseness 01/12/2016  . Pharyngoesophageal dysphagia 01/12/2016  . Chronic low back pain [facility] Area of Pain) (Bilateral) (L>R) 10/26/2015  . 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 Syncope, non cardiac 05/22/2015  . Thrombocytopenia ([place]) 10/07/2014  . Hypoxia 10/05/2014  . Anxiety 06/15/2014  .  Duodenogastric reflux 06/15/2014  . Benign essential HTN 06/15/2014  . Hypercholesteremia 07/30/2013  . HTN (hypertension) 07/30/2013  . GERD (gastroesophageal reflux disease) 07/30/2013  . Memory loss 05/03/2013  . Chronic knee pain (Primary Area of Pain) (Right) 09/10/2012    Past Surgical History:  Procedure Laterality Date  . ABDOMINAL HYSTERECTOMY    . ABDOMINAL SURGERY    . APPENDECTOMY    . BLADDER SURGERY    . CATARACT EXTRACTION W/ INTRAOCULAR LENS  IMPLANT, BILATERAL Bilateral   . CHOLECYSTECTOMY    . COLON SURGERY    . COLOSTOMY REVERSAL    . ESOPHAGOGASTRODUODENOSCOPY N/A 10/30/2018   Procedure: ESOPHAGOGASTRODUODENOSCOPY (EGD);  Surgeon: Lin Landsman, MD;  Location: [facility] ENDOSCOPY;  Service: Gastroenterology;  Laterality: N/A;  . INTRAMEDULLARY (IM) NAIL INTERTROCHANTERIC Right 09/13/2016   Procedure: INTRAMEDULLARY (IM) NAIL INTERTROCHANTRIC RIGHT;  Surgeon: Leandrew Koyanagi, MD;  Location: [facility];  Service: Orthopedics;  Laterality: Right;  . JOINT REPLACEMENT Bilateral   . JOINT REPLACEMENT    . KNEE SURGERY    . OSTOMY    . RECTOCELE REPAIR    . TOE SURGERY Right    3rd  . TOTAL HIP ARTHROPLASTY Right 09/30/2017   Procedure: RIGHT HIP IM NAIL REMOVAL WITH CONVERSION TO TOTAL HIP ARTHOPLASTY, anterior  approach;  Surgeon: Renette Butters, MD;  Location: [facility];  Service: Orthopedics;  Laterality: Right;    Prior to Admission medications   Medication Sig Start Date End Date Taking? Authorizing Provider  acetaminophen (TYLENOL) 500 MG tablet Take 2 tablets (1,000 mg total) by mouth in the morning, at noon, and at bedtime. 02/03/20   Duffy Bruce, MD  albuterol (ACCUNEB) 1.25 MG/3ML nebulizer solution Take 3 mLs (1.25 mg total) by nebulization every 6 (six) hours as needed for wheezing. 08/26/18   [name], FNP  albuterol (VENTOLIN HFA) 108 (90 Base) MCG/ACT inhaler Inhale 2 puffs into the lungs every 6 (six) hours as needed for wheezing or shortness of  breath. 09/25/18   [name], FNP  alendronate (FOSAMAX) 70 MG tablet TAKE 1 TABLET BY MOUTH ONCE A WEEK 06/09/19   [name], FNP  ALPRAZolam Duanne Moron) 1 MG tablet Take 0.5-1 tablets (0.5-1 mg total) by mouth 2 (two) times daily as needed for anxiety. 01/17/20   [name], FNP  amLODipine (NORVASC) 5 MG tablet TAKE 1 TABLET BY MOUTH EVERY DAY 08/30/19   [name], FNP  ciprofloxacin (CIPRO) 500 MG tablet Take 1 tablet (500 mg total) by mouth 2 (two) times daily for 7 days. 02/04/20 02/11/20  Duffy Bruce, MD  diclofenac Sodium (VOLTAREN) 1 % GEL Apply 4 g topically in the morning, at noon, and at bedtime. Around or in place of lidoderm patch 02/03/20   Duffy Bruce, MD  docusate sodium (COLACE) 100 MG capsule Take 200 mg by mouth 2 (two) times daily as needed for moderate constipation.     [provider]  DULoxetine (CYMBALTA) 20 MG capsule TAKE 2 CAPSULES BY MOUTH EVERY DAY 12/23/19   [name], FNP  ELIQUIS 2.5 MG TABS tablet TAKE 1 TABLET BY MOUTH TWICE A DAY 09/04/19   Earlie Server, MD  esomeprazole (NEXIUM) 40 MG capsule TAKE 1 CAPSULE BY MOUTH EVERY DAY 09/15/19   [name], FNP  FLOVENT HFA 220 MCG/ACT inhaler TAKE 1 PUFF BY MOUTH TWICE A DAY 12/14/19   [name], FNP  fluticasone (FLONASE) 50 MCG/ACT nasal spray SPRAY 2 SPRAYS INTO EACH NOSTRIL EVERY DAY 08/25/19   [name], FNP  lidocaine (LIDODERM) 5 % Place 1 patch onto the skin every 12 (twelve) hours for 7 days. Remove & Discard patch within 12 hours or as directed by MD 02/03/20 02/10/20  Duffy Bruce, MD  LINZESS 290 MCG CAPS capsule TAKE 1 CAPSULE BY MOUTH EVERY DAY 08/30/19   [name], FNP  lovastatin (MEVACOR) 10 MG tablet TAKE 1 TABLET BY MOUTH EVERY DAY 11/03/19   [name], FNP  mirtazapine (REMERON) 7.5 MG tablet TAKE 1 TABLET (7.5 MG TOTAL) BY MOUTH AT BEDTIME. 02/01/20   Bedsole, Amy E, MD  NARCAN 4 MG/0.1ML LIQD nasal spray kit  09/10/18    [provider]  ondansetron (ZOFRAN ODT) 4 MG disintegrating tablet Take 1 tablet (4 mg total) by mouth every 8 (eight) hours as needed for nausea or vomiting. 06/12/18   [name], FNP  orphenadrine (NORFLEX) 100 MG tablet 100 mg as needed. 11/11/19   [provider]  Oxycodone HCl 10 MG TABS Take 10 mg by mouth 3 (three) times daily. 09/23/18   [provider]  polyethylene glycol (MIRALAX / GLYCOLAX) packet Take 17 g by mouth daily as needed (for constipation.).     [provider]  pregabalin (LYRICA)  100 MG capsule TAKE 1 CAPSULE BY MOUTH TWICE A DAY 06/11/19   [name], FNP  promethazine (PHENERGAN) 12.5 MG tablet TAKE 1 TABLET (12.5 MG TOTAL) BY MOUTH EVERY 12 (TWELVE) HOURS AS NEEDED FOR NAUSEA OR VOMITING. 12/30/19   [name], FNP  traZODone (DESYREL) 50 MG tablet TAKE 0.5-1.5 TABLETS (25-75 MG TOTAL) BY MOUTH AT BEDTIME AS NEEDED FOR SLEEP. Patient not taking: Reported on 02/01/2020 10/27/19   [name], FNP    Allergies Ampicillin, Ampicillin-sulbactam sodium, Ambien [zolpidem tartrate], Cyclobenzaprine, Tape, Toradol [ketorolac tromethamine], Sulfamethoxazole-trimethoprim, Zolpidem, Cephalexin, and Tramadol  Family History  Problem Relation Age of Onset  . Heart failure Mother   . Heart attack Father   . Lung cancer Sister   . Deep vein thrombosis Sister   . Pulmonary embolism Son   . Deep vein thrombosis Son   . Cervical cancer Daughter     Social History Social History   Tobacco Use  . Smoking status: Never Smoker  . Smokeless tobacco: Never Used  Vaping Use  . Vaping Use: Former  Substance Use Topics  . Alcohol use: Not Currently  . Drug use: Not Currently    Review of Systems  Review of Systems  Constitutional: Positive for fatigue. Negative for chills and fever.  HENT: Negative for sore throat.   Respiratory: Positive for cough. Negative for shortness of breath.   Cardiovascular: Positive for  chest pain.  Gastrointestinal: Negative for abdominal pain.  Genitourinary: Negative for flank pain.  Musculoskeletal: Negative for gait problem and neck pain.  Skin: Negative for rash and wound.  Allergic/Immunologic: Negative for immunocompromised state.  Neurological: Negative for weakness and numbness.  Hematological: Does not bruise/bleed easily.     ____________________________________________  PHYSICAL EXAM:      VITAL SIGNS: ED Triage Vitals  Enc Vitals Group     BP 02/03/20 2041 (!) 152/110     Pulse Rate 02/03/20 2041 (!) 106     Resp 02/03/20 2041 (!) 28     Temp 02/03/20 2041 98.8 F (37.1 C)     Temp Source 02/03/20 2041 Oral     SpO2 02/03/20 2041 95 %     Weight 02/03/20 2042 163 lb 2.3 oz (74 kg)     Height 02/03/20 2042 '5\' 5"'  (1.651 m)     Head Circumference --      Peak Flow --      Pain Score 02/03/20 2042 9     Pain Loc --      Pain Edu? --      Excl. in [place]? --      Physical Exam Vitals and nursing note reviewed.  Constitutional:      General: She is not in acute distress.    Appearance: She is well-developed.  HENT:     Head: Normocephalic and atraumatic.  Eyes:     Conjunctiva/sclera: Conjunctivae normal.  Cardiovascular:     Rate and Rhythm: Regular rhythm. Tachycardia present.     Heart sounds: Normal heart sounds. No murmur heard.  No friction rub.  Pulmonary:     Effort: Pulmonary effort is normal. No respiratory distress.     Breath sounds: Normal breath sounds. No wheezing or rales.  Chest:     Comments: Marked TTP over right lower and lateral chest wall. No apparent skin lesions or rash. No bruising. No deformity Abdominal:     General: There is no distension.     Palpations: Abdomen is soft.  Tenderness: There is no abdominal tenderness.  Musculoskeletal:     Cervical back: Neck supple.  Skin:    General: Skin is warm.     Capillary Refill: Capillary refill takes less than 2 seconds.  Neurological:     Mental Status: She  is alert and oriented to person, place, and time.     Motor: No abnormal muscle tone.       ____________________________________________   LABS (all labs ordered are listed, but only abnormal results are displayed)  Labs Reviewed  BASIC METABOLIC PANEL - Abnormal; Notable for the following components:      Result Value   Potassium 3.1 (*)    Glucose, Bld 111 (*)    GFR calc non Af Amer 57 (*)    All other components within normal limits  CBC - Abnormal; Notable for the following components:   Hemoglobin 10.3 (*)    HCT 34.1 (*)    MCH 25.1 (*)    RDW 19.0 (*)    All other components within normal limits  HEPATIC FUNCTION PANEL - Abnormal; Notable for the following components:   Albumin 2.8 (*)    All other components within normal limits  URINALYSIS, COMPLETE (UACMP) WITH MICROSCOPIC - Abnormal; Notable for the following components:   Color, Urine YELLOW (*)    APPearance CLOUDY (*)    Leukocytes,Ua MODERATE (*)    Bacteria, UA RARE (*)    All other components within normal limits  PROTIME-INR  LIPASE, BLOOD  TROPONIN I (HIGH SENSITIVITY)  TROPONIN I (HIGH SENSITIVITY)    ____________________________________________  EKG: Sinus tachycardia, normal axis. No acute ST elevations or depressions. No significant change from prior. ________________________________________  RADIOLOGY All imaging, including plain films, CT scans, and ultrasounds, independently reviewed by me, and interpretations confirmed via formal radiology reads.  ED MD interpretation:   CXR: Stable, persistent right medial lung mass  Official radiology report(s): DG Chest 1 View  Result Date: 02/03/2020 CLINICAL DATA:  Right-sided chest pain. Recent lung cancer diagnosis. EXAM: CHEST  1 VIEW COMPARISON:  Radiograph and CT 3 days ago 01/31/2020 FINDINGS: Stable heart size and mediastinal contours. Rounded density projecting over the medial right lung base corresponds to lung mass on CT. No acute airspace  disease. No developing pleural effusion. No pneumothorax. Chronic interstitial coarsening. Stable osseous structures. IMPRESSION: 1. Stable radiographic appearance of the chest. 2. Rounded density projecting over the medial right lung base corresponds to lung mass on CT. Electronically Signed   By: Keith Rake M.D.   On: 02/03/2020 21:14    ____________________________________________  PROCEDURES   Procedure(s) performed (including Critical Care):  Procedures  ____________________________________________  INITIAL IMPRESSION / MDM / [place] / ED COURSE  As part of my medical decision making, I reviewed the following data within the [place] notes reviewed and incorporated, Old chart reviewed, Notes from prior ED visits, and [place] Controlled Substance Ers[place]ine was evaluated in Emergency Department on 02/04/2020 for the symptoms described in the history of present illness. She was evaluated in the context of the global COVID-19 pandemic, which necessitated consideration that the patient might be at risk for infection with the SARS-CoV-2 virus that causes COVID-19. Institutional protocols and algorithms that pertain to the evaluation of patients at risk for COVID-19 are in a state of rapid change based on information released by regulatory bodies including the CDC and federal and state organizations. These policies and algorithms  were followed during the patient's care in the ED.  Some ED evaluations and interventions may be delayed as a result of limited staffing during the pandemic.*     Medical Decision Making:  79 yo F here with likely R sided chest wall pain, which I suspect is 2/2 necrotic lung mass. Multiple recent MD visits for this including extensive w/u 9/6 which included neg CT ANgio PE. Moreover, she is already on Eliquis for PE so do not suspect recurrent or ongoing PE. Pain is similar to her pain then per patient. Pt  also had incidentally noted biliary dilatation on CT at that time - normal LFTs here, no RUQ pain, do not suspect cholangitis, or cholecystitis.   Will plan to repeat trop, then have her start to schedule her pain regimen at home to assist with control. If second trop negative, can d/c with outpt oncology f/u. Already has oxycodone prescribed, will add scheduled tylenol, voltaren, and lidoderm patches as well as regular scheduled oxy in place of PRN.  Addendum: UA also noted to have almost incidental WBCs. No urinary urgency or frequency. She has no CVAT on exam, no nausea, no fever, no vomoiting. Doubt pyelo but given that she has some flank component, will treat for possible ascending UTI. Cipro given due to allergies/intolerance of alternatives.  ____________________________________________  FINAL CLINICAL IMPRESSION(S) / ED DIAGNOSES  Final diagnoses:  Cancer related pain  Atypical chest pain  Acute cystitis without hematuria     MEDICATIONS GIVEN DURING THIS VISIT:  Medications  lidocaine (LIDODERM) 5 % 1 patch (1 patch Transdermal Patch Applied 02/03/20 2239)  morphine 4 MG/ML injection 4 mg (4 mg Intravenous Given 02/03/20 2104)  ondansetron (ZOFRAN) injection 4 mg (4 mg Intravenous Given 02/03/20 2104)  ketorolac (TORADOL) 30 MG/ML injection 15 mg (15 mg Intravenous Given 02/03/20 2239)  acetaminophen (TYLENOL) tablet 1,000 mg (1,000 mg Oral Given 02/03/20 2238)  oxyCODONE (Oxy IR/ROXICODONE) immediate release tablet 5 mg (5 mg Oral Given 02/03/20 2347)  pregabalin (LYRICA) capsule 100 mg (100 mg Oral Given 02/03/20 2344)  amLODipine (NORVASC) tablet 5 mg (5 mg Oral Given 02/03/20 2346)     ED Discharge Orders         Ordered    ciprofloxacin (CIPRO) 500 MG tablet  2 times daily        02/04/20 0217    lidocaine (LIDODERM) 5 %  Every 12 hours        02/03/20 2340    acetaminophen (TYLENOL) 500 MG tablet  3 times daily        02/03/20 2340    diclofenac Sodium (VOLTAREN) 1 % GEL  3  times daily        02/03/20 2340           Note:  This document was prepared using Dragon voice recognition software and may include unintentional dictation errors.   Duffy Bruce, MD 02/04/20 Ernestine Mcmurray    Duffy Bruce, MD 02/04/20 [phone]

## 2020-02-03 NOTE — Discharge Instructions (Signed)
For your pain:  Take TYLENOL 1000 mg every 8 hours throughout the day  Take OXYCODONE 5-10 mg every 8 hours for pain - For the next 24 hours, take this scheduled then as needed:  Apply the lidoderm patch to the area of worst pain on the right chest twice a day  For additional areas, use the voltaren gel up to four times a day

## 2020-02-03 NOTE — ED Triage Notes (Signed)
Pt to ED via [facility] from home c/o right sided chest pain that started several days ago after lung cancer dx but "wont go away".  Denies radiating to arms or back, pain with palpation and inspirations.  Pt used oxycodone and tylenol at home for pain.  A&OX4, chest rise even and unlabored, anxious and in NAD at this time.

## 2020-02-04 ENCOUNTER — Telehealth: Payer: Self-pay

## 2020-02-04 ENCOUNTER — Other Ambulatory Visit: Payer: Self-pay | Admitting: *Deleted

## 2020-02-04 DIAGNOSIS — R918 Other nonspecific abnormal finding of lung field: Secondary | ICD-10-CM

## 2020-02-04 LAB — URINALYSIS, COMPLETE (UACMP) WITH MICROSCOPIC
Bilirubin Urine: NEGATIVE
Glucose, UA: NEGATIVE mg/dL
Hgb urine dipstick: NEGATIVE
Ketones, ur: NEGATIVE mg/dL
Nitrite: NEGATIVE
Protein, ur: NEGATIVE mg/dL
Specific Gravity, Urine: 1.012 (ref 1.005–1.030)
pH: 6 (ref 5.0–8.0)

## 2020-02-04 LAB — TROPONIN I (HIGH SENSITIVITY): Troponin I (High Sensitivity): 6 ng/L (ref <18)

## 2020-02-04 MED ORDER — CIPROFLOXACIN HCL 500 MG PO TABS: 500.0000 mg | ORAL_TABLET | Freq: Two times a day (BID) | ORAL | 0 refills | Status: AC

## 2020-02-04 NOTE — Telephone Encounter (Signed)
Noted  

## 2020-02-04 NOTE — Telephone Encounter (Signed)
[facility] Night - Client TELEPHONE ADVICE RECORD [facility] Patient Name: [name] Gender: Female DOB: 1940/09/07 Age: 79 Y 82 M Return Phone Number: [phone] (Primary), [phone] (Secondary) Address[name] [address] Client [facility] Night - Client Client Site [place] Physician [name]- NP Contact Type Call Who Is Calling Patient / Member / Family / Caregiver Call Type Triage / Clinical Caller Name [name] Relationship To Patient Daughter Return Phone Number [phone] (Primary) Chief Complaint CHEST PAIN (>=21 years) - pain, pressure, heaviness or tightness Reason for Call Symptomatic / Request for Health Information Initial Comment Caller states mother is hurting a lot worse. Lower right lung all the way up her back. She cannot move. Recently dx with lung cancer. Translation No Nurse Assessment Nurse: Toy Cookey, RN, [place] Kidney Date/Time (Eastern Time): 02/03/2020 7:36:31 PM Confirm and document reason for call. If symptomatic, describe symptoms. ---Caller states mother is hurting a lot worse. Lower right lung all the way up her back. She cannot move. Started today Recently dx with lung cancer. Has the patient had close contact with a person known or suspected to have the novel coronavirus illness OR traveled / lives in area with major community spread (including international travel) in the last 14 days from the onset of symptoms? * If Asymptomatic, screen for exposure and travel within the last 14 days. ---No Does the patient have any new or worsening symptoms? ---Yes Will a triage be completed? ---Yes Related visit to physician within the last 2 weeks? ---No Does the PT have any chronic conditions? (i.e. diabetes, asthma, this includes High risk factors for pregnancy, etc.) ---Unknown Is this a behavioral health or substance abuse call?  ---No Guidelines Guideline Title Affirmed Question Affirmed Notes Nurse Date/Time (Eastern Time) Back Pain [1] SEVERE back pain (e.g., excruciating) AND [2] sudden onset AND [3] age > 6 years Bonnetta Barry 02/03/2020 7:38:32 PM PLEASE NOTE: All timestamps contained within this report are represented as [place] Standard Time. CONFIDENTIALTY NOTICE: This fax transmission is intended only for the addressee. It contains information that is legally privileged, confidential or otherwise protected from use or disclosure. If you are not the intended recipient, you are strictly prohibited from reviewing, disclosing, copying using or disseminating any of this information or taking any action in reliance on or regarding this information. If you have received this fax in error, please notify us immediately by telephone so that we can arrange for its return to [place]. Phone: [phone], Toll-Free: [phone], Fax: [phone] Page: 2 of 2 Call Id: [id number] [place]. Time [name] Time) Disposition Final User 02/03/2020 7:35:15 PM Send to Urgent [name], [place] 02/03/2020 7:40:49 PM Go to ED Now Yes Toy Cookey, RN, [place] Commander Disagree/Comply Comply Caller Understands Yes PreDisposition InappropriateToAsk Care Advice Given Per Guideline GO TO ED NOW: * Leave now. Drive carefully. ANOTHER ADULT SHOULD DRIVE: * It is better and safer if another adult drives instead of you. BRING MEDICINES: * Bring a list of your current medicines when you go to the Emergency Department (ER). Referrals [facility] - ED

## 2020-02-04 NOTE — Telephone Encounter (Signed)
[name] (daughter) called stating pt went er last night for pain.  [name] stated pt oncology's want pt see pulmonary and an IR dr.   [name] wanted to know if debbie could do a referral for her.  They have not heard anything regarding from oncology   [name] stated pt is in a lot of pain.  [name] stated pt's oxy10 that she gets from pain clinic is not touching her pain.  [name] best number [phone]

## 2020-02-04 NOTE — Telephone Encounter (Signed)
t review tab pt went to [facility] ED on 02/03/20.

## 2020-02-04 NOTE — Telephone Encounter (Signed)
Called and spoke with patient's daughter, [name], regarding patient's nausea and pain.  Patient is currently sleeping.  She is followed by pain management and has an appointment February 11, 2020.  The difficulty getting an appointment with pulmonary but are seeing Patricia Nettle in [place] next Wednesday.  I do not see any notes regarding tumor board or consultation with IR.  Discussed increasing promethazine 12.5 mg from every 12 hours to every 8 hours.  Continue oxycodone 10 mg every 6 hours per pain management, Tylenol 2 tablets every 8 hours as needed, heat, compression.

## 2020-02-05 IMAGING — CT CT HEAD W/O CM
3 series · 16 of 47 positions shown, 19 images · non-contrast
Comparison: 06/25/2017

CLINICAL DATA: New mental status change on anticoagulation for PE.
Hx stroke, spesis, HTN, skin cancer, bacteremia, and anemia.

EXAM:
CT HEAD WITHOUT CONTRAST
TECHNIQUE: Contiguous axial images were obtained from the base of the skull
through the vertex without intravenous contrast.

[Series 3: head 5.0 h30s · axial · 0.41mm/px · z∈[-133,+7]mm · 10 of 34 slices shown, 13 images]
[im 3/34  brain]
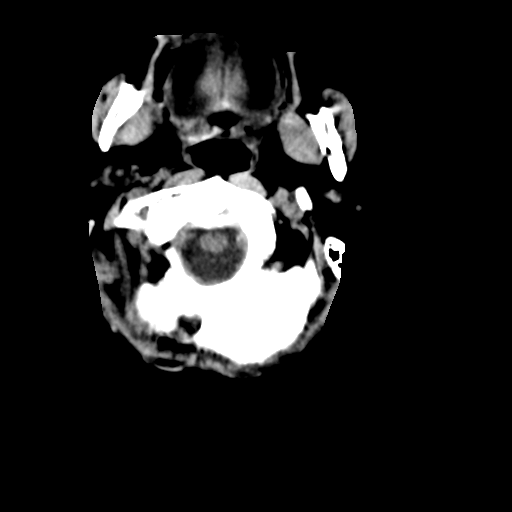
[im 3/34  bone]
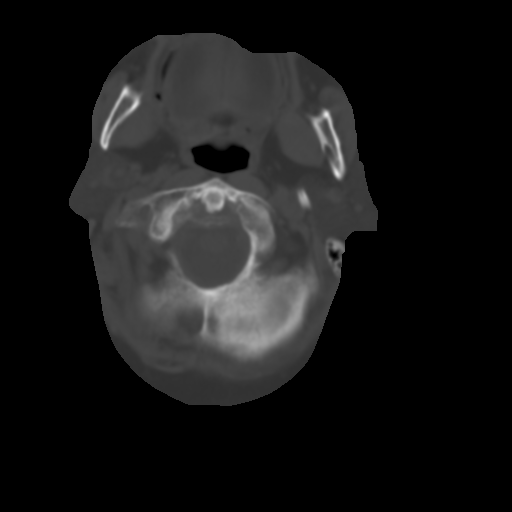
[im 6/34  brain]
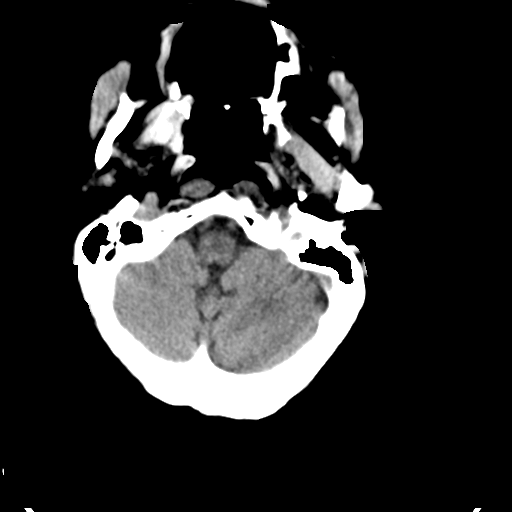
[im 10/34  brain]
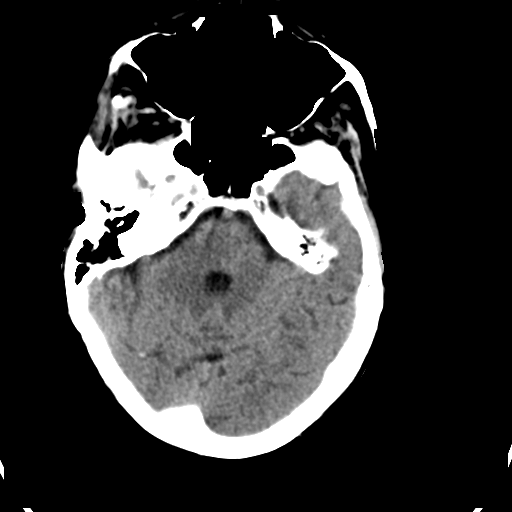
[im 12/34  brain]
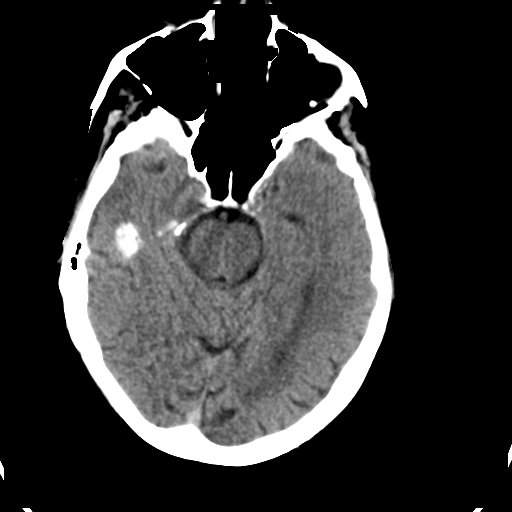
[im 15/34  brain]
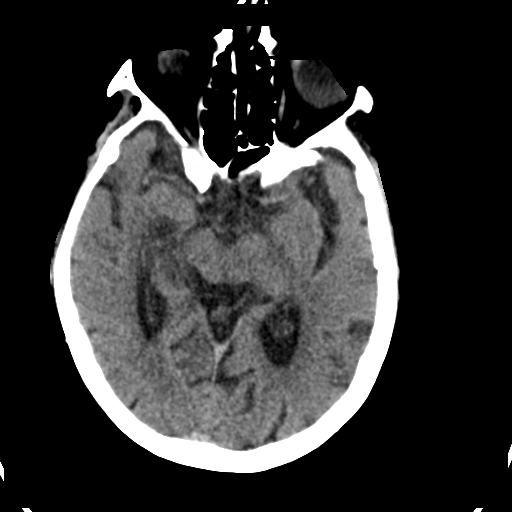
[im 15/34  bone]
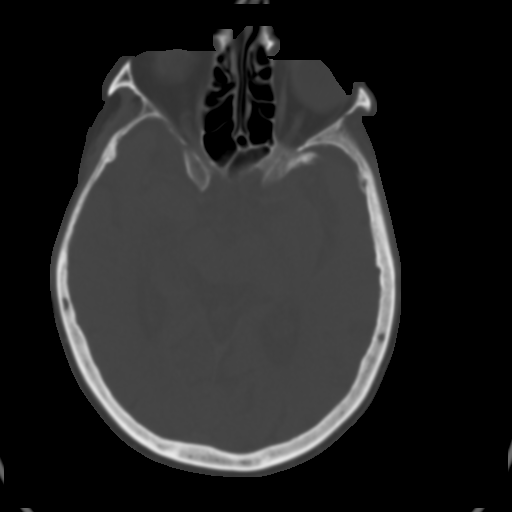
[im 19/34  brain]
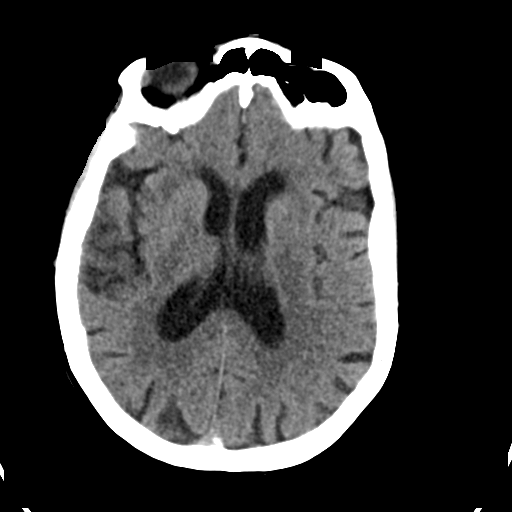
[im 22/34  brain]
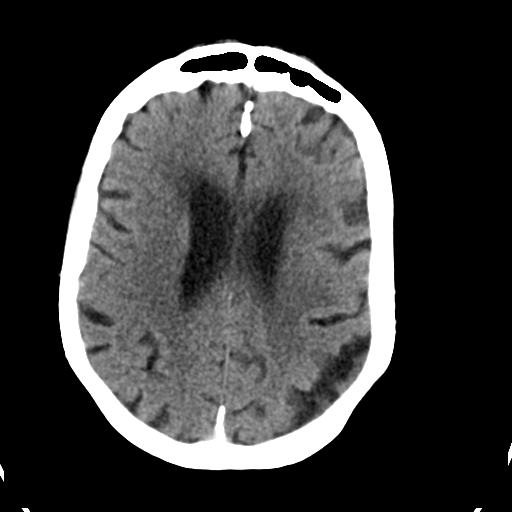
[im 26/34  brain]
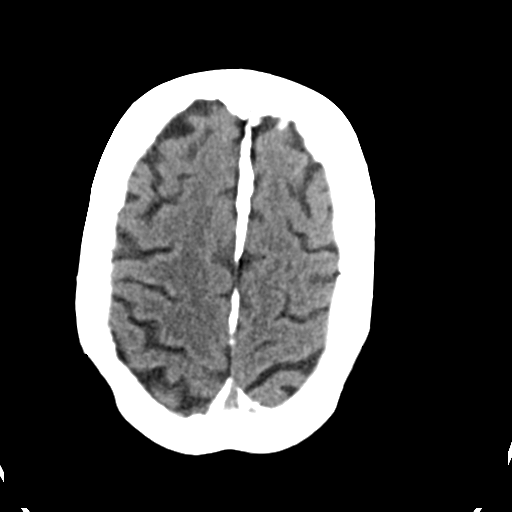
[im 28/34  brain]
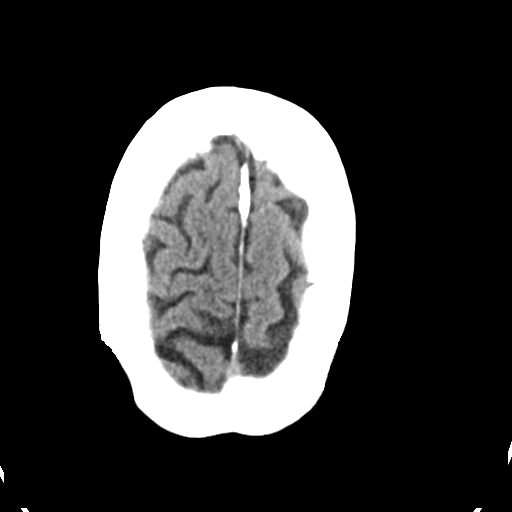
[im 28/34  bone]
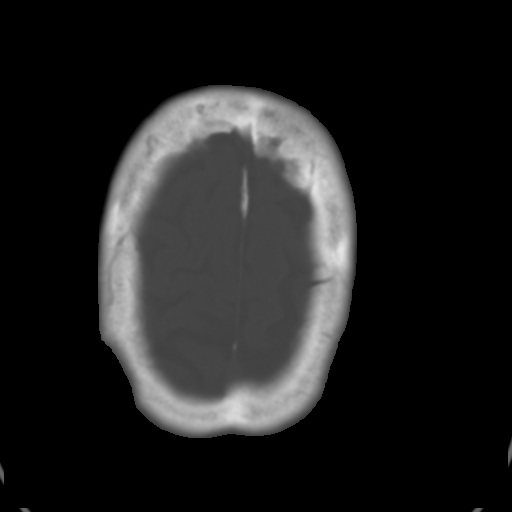
[im 31/34  brain]
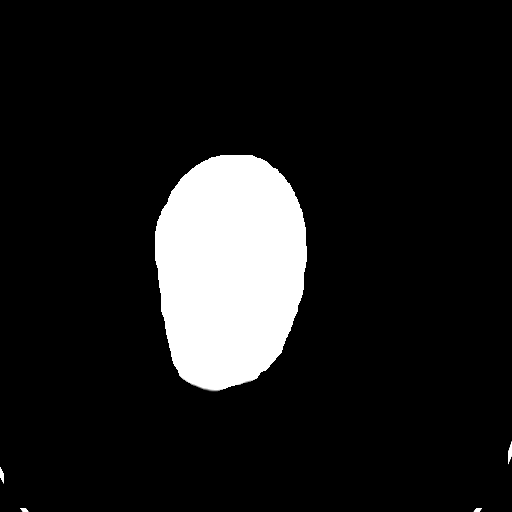

[Series 5: head 3.0 mpr cor · coronal · 0.32mm/px · 3 of 69 slices shown]
[im 23/69  brain]
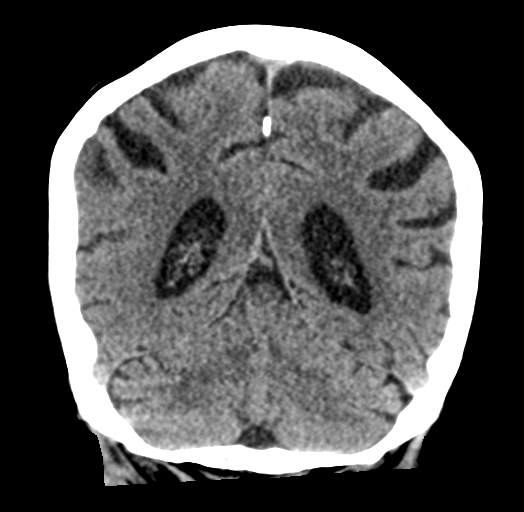
[im 31/69  brain]
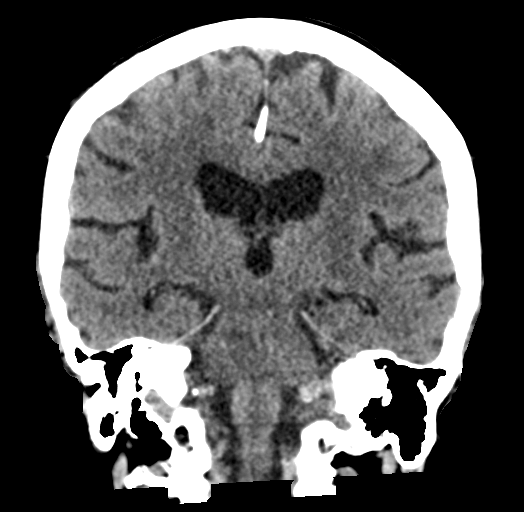
[im 38/69  brain]
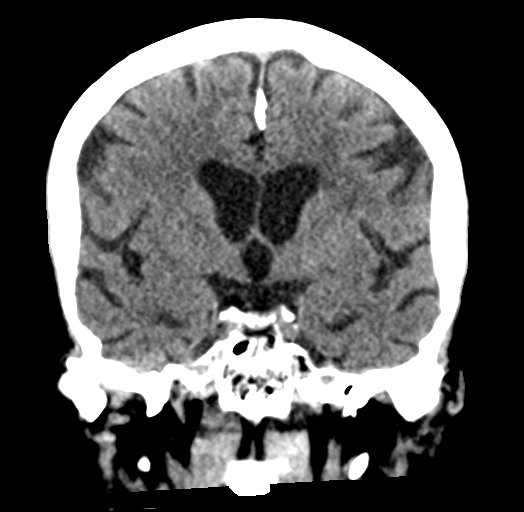

[Series 6: head 3.0 mpr sag · sagittal · 0.37mm/px · 3 of 51 slices shown]
[im 17/51  brain]
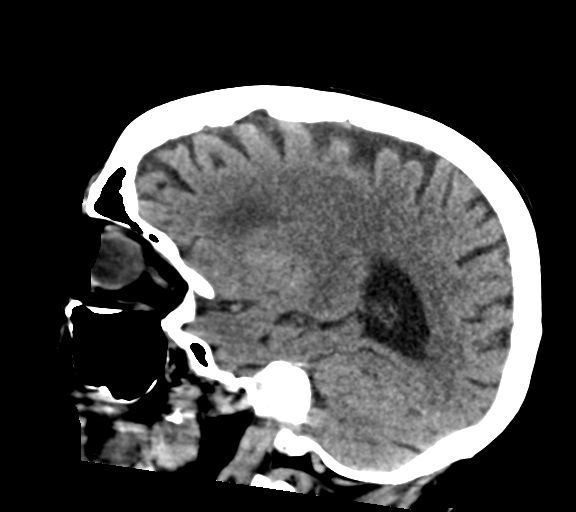
[im 26/51  brain]
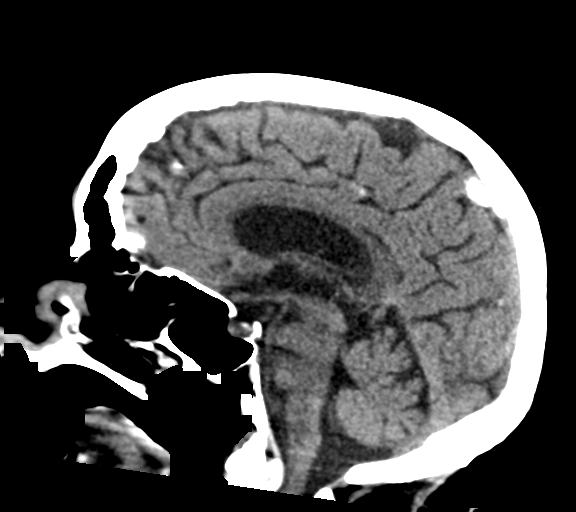
[im 34/51  brain]
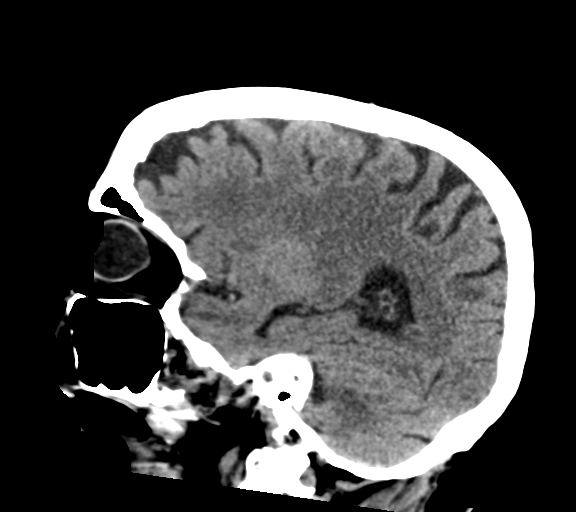

[16 of 47 positions shown; findings below may reference images not displayed]

FINDINGS: Brain: No evidence of acute infarction, hemorrhage, hydrocephalus,
extra-axial collection or mass lesion/mass effect.

There is mild generalized atrophy and periventricular white matter
hypoattenuation consistent with mild chronic microvascular ischemic
change, stable from the prior exam.

Vascular: No hyperdense vessel or unexpected calcification.

Skull: Normal. Negative for fracture or focal lesion.

Sinuses/Orbits: No acute finding.

Other: None.
IMPRESSION: 1. No acute intracranial abnormalities.
2. Mild atrophy and chronic microvascular ischemic change.

## 2020-02-05 IMAGING — DX DG CHEST 1V PORT
1 series · 1 of 1 positions shown · non-contrast
Comparison: CTA chest and chest radiographs dated 04/30/2018

CLINICAL DATA: Follow-up pneumonia, PE.

EXAM:
PORTABLE CHEST 1 VIEW

[chest]
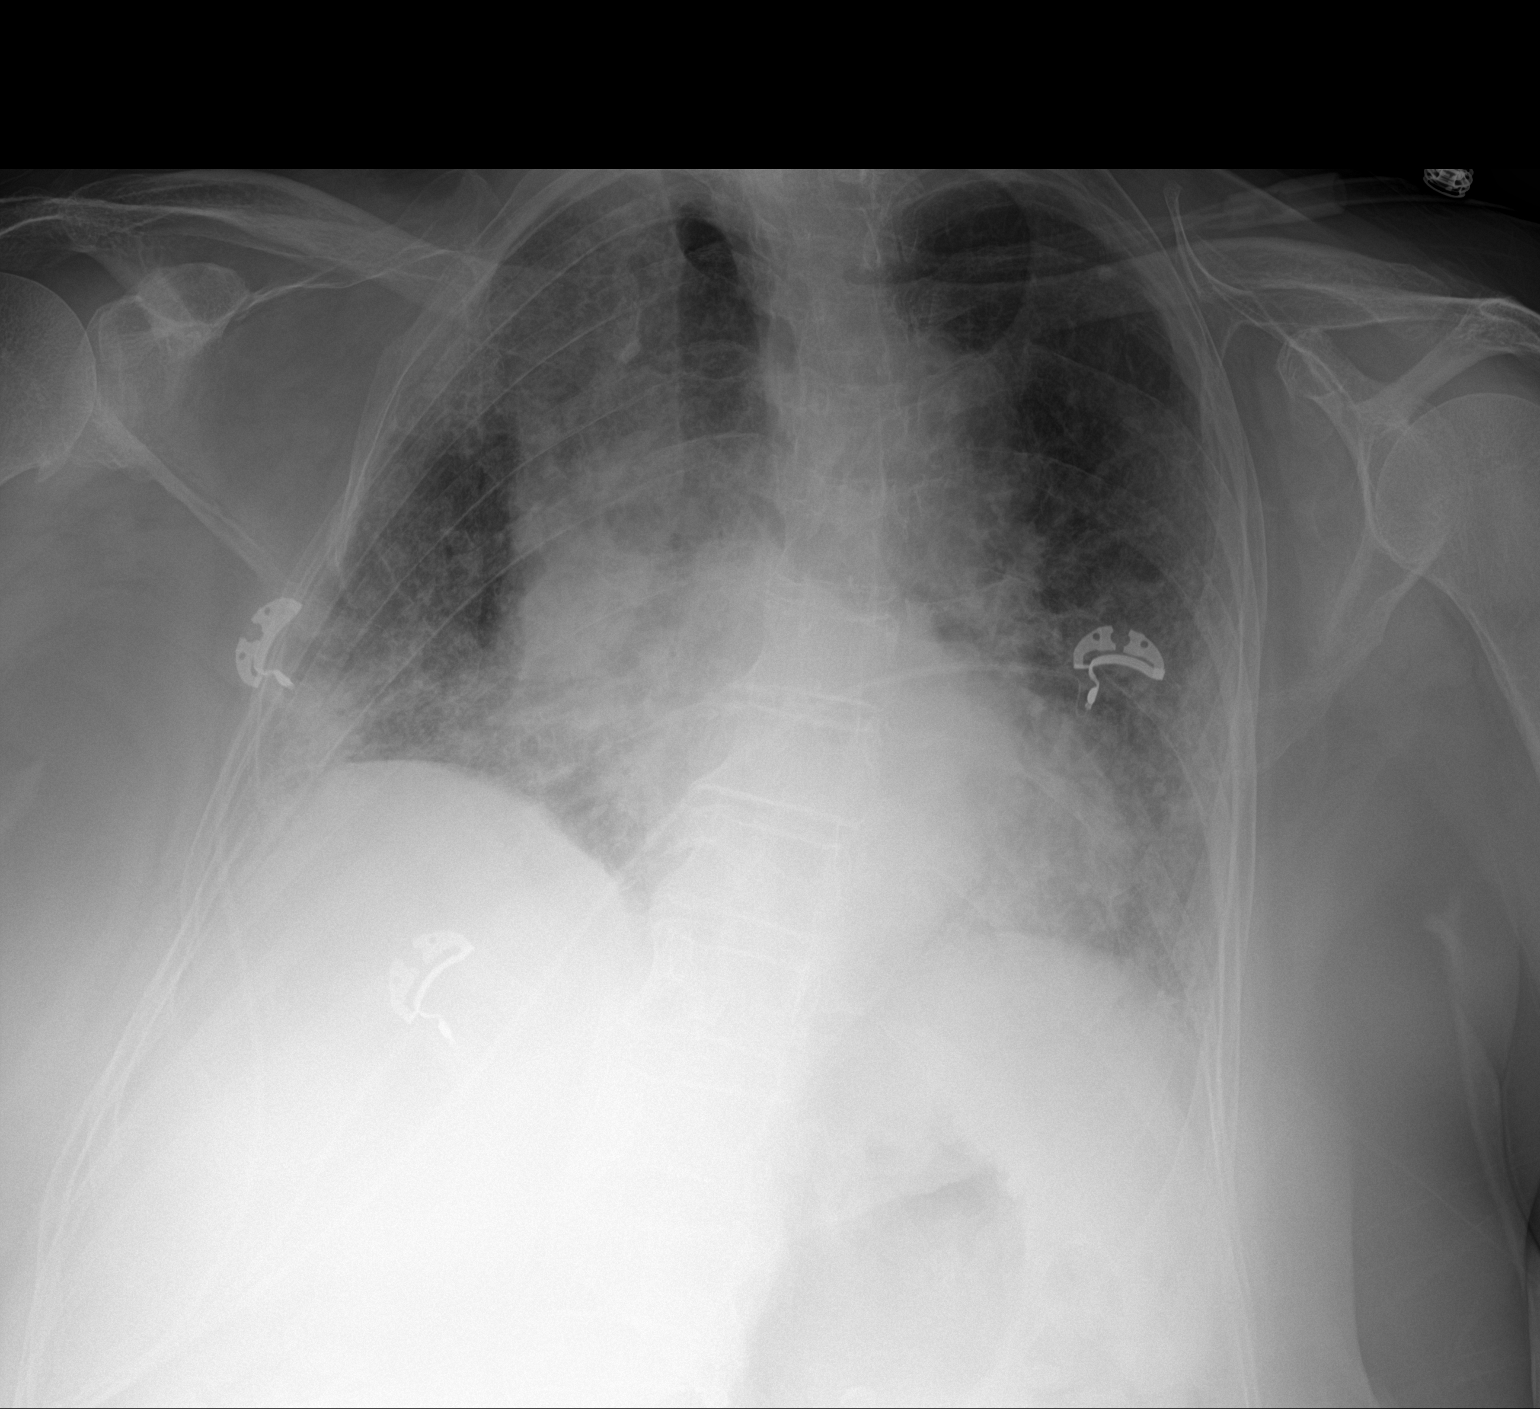

[1 of 1 positions shown; findings below may reference images not displayed]

FINDINGS: Multifocal patchy opacities in the bilateral upper lobes, right
lower lobe predominant, suspicious for pneumonia. No definite
pleural effusions. No pneumothorax.

Cardiomegaly.
IMPRESSION: Multifocal patchy opacities, right lower lobe predominant,
suspicious for pneumonia.

When compared to prior chest radiographs, the overall appearance is
similar, although the left upper lobe opacity is improved/less
focal.

## 2020-02-06 LAB — URINE CULTURE: Culture: 60000 — AB

## 2020-02-07 ENCOUNTER — Encounter: Payer: Self-pay | Admitting: Family Medicine

## 2020-02-07 NOTE — Telephone Encounter (Signed)
Called pt's [name]. [name] stated that when she took her mom to the hospital 02/03/2020 is when she first heard of the compression fracture from the scan.  She stated that her mom did injure her back years ago but compression fracture T-1 and T-3 was never mentioned. [name] stated that she did call the pain management clinic as well as faxed information from [place] to them, still haven't heard back from the clinic.

## 2020-02-07 NOTE — Telephone Encounter (Signed)
Patient's daughter called and said patient is still in a lot of pain.  She has a compression fracture between T-1 and T-3. [name] wants to know if patient can get a referral for compression fracture. She can go to [place] and she can go anytime. [name] can be reached at [phone].

## 2020-02-07 NOTE — Telephone Encounter (Signed)
Please call patient's daughter. Ask her about compression fracture, is it new? I did not see it in recent scans. Was she able to get in touch with pain management regarding her mother's pain and additional pain control?

## 2020-02-08 ENCOUNTER — Telehealth: Payer: Self-pay | Admitting: Oncology

## 2020-02-08 ENCOUNTER — Other Ambulatory Visit: Payer: Self-pay | Admitting: Family Medicine

## 2020-02-08 DIAGNOSIS — R112 Nausea with vomiting, unspecified: Secondary | ICD-10-CM

## 2020-02-08 MED ORDER — PROMETHAZINE HCL 12.5 MG PO TABS: 12.5000 mg | ORAL_TABLET | Freq: Three times a day (TID) | ORAL | 1 refills | Status: DC | PRN

## 2020-02-08 NOTE — Telephone Encounter (Signed)
P{t daughter called requesting information on possible IR procedure for mother. She would like to know what the plan is as she has not heard anything about her follow-up.

## 2020-02-08 NOTE — Telephone Encounter (Signed)
[name], any updates about her biopsy? Pulmonology?

## 2020-02-08 NOTE — Telephone Encounter (Signed)
Replied on other thread. Need urine sample.

## 2020-02-08 NOTE — Telephone Encounter (Signed)
Mychart messages sent to patient's daughter.

## 2020-02-09 ENCOUNTER — Ambulatory Visit: Payer: Medicare Other | Admitting: Adult Health

## 2020-02-09 ENCOUNTER — Other Ambulatory Visit: Payer: Self-pay

## 2020-02-09 ENCOUNTER — Encounter: Payer: Self-pay | Admitting: Pulmonary Disease

## 2020-02-09 ENCOUNTER — Ambulatory Visit (INDEPENDENT_AMBULATORY_CARE_PROVIDER_SITE_OTHER): Payer: Medicare Other | Admitting: Pulmonary Disease

## 2020-02-09 VITALS — BP 142/86 | HR 103 | Temp 96.9°F | Ht 65.0 in | Wt 162.0 lb

## 2020-02-09 DIAGNOSIS — Z86711 Personal history of pulmonary embolism: Secondary | ICD-10-CM | POA: Diagnosis not present

## 2020-02-09 DIAGNOSIS — J449 Chronic obstructive pulmonary disease, unspecified: Secondary | ICD-10-CM

## 2020-02-09 DIAGNOSIS — J849 Interstitial pulmonary disease, unspecified: Secondary | ICD-10-CM | POA: Diagnosis not present

## 2020-02-09 DIAGNOSIS — R918 Other nonspecific abnormal finding of lung field: Secondary | ICD-10-CM

## 2020-02-09 NOTE — Telephone Encounter (Signed)
Pt is scheduled to see pulmonary today at 1130.

## 2020-02-09 NOTE — Patient Instructions (Signed)
We are scheduling you for a biopsy of the right lung spot seen on CT.  We will see you in follow-up in 2 months time call sooner should any new problems arise.  We will call you with the results of the biopsy prior to that time.  I have already notified Dr. Tasia Catchings and Weston Anna, oncology navigator to stay in touch with you.

## 2020-02-09 NOTE — Telephone Encounter (Signed)
Hayley, do you mind giving daughter a call and explain to her? Thanks.

## 2020-02-09 NOTE — Progress Notes (Deleted)
   Subjective:    Patient ID: [name], female    DOB: 1940-09-03, 79 y.o.   MRN: [id number]  HPI    Review of Systems     Objective:   Physical Exam        Assessment & Plan:

## 2020-02-09 NOTE — Progress Notes (Signed)
Subjective:    Patient ID: [name], female    DOB: 19-Oct-1940, 79 y.o.   MRN: [id number]  HPI Patient is a very complex 79 year old former smoker (quit 10 years) whom we evaluated here initially in February 2021 for a preoperative evaluation.  At that time she was to have revision of a loose hip prosthesis.  This was performed and the patient has done relatively well postoperatively.  Day she presents in August the patient started having issues with right upper quadrant and right flank pain.  She had a PE performed that showed no acute PE, chronic pulmonary fibrosis and a new 3 cm rounded opacity in the superior segment of the right lower lobe that abuts the chest wall.  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  In addition the patient has a hypermetabolic azygos esophageal recess lymph node on the right with an SUV of 16.2.  There is a low right paratracheal node with equivocal SUV activity.  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  We are asked to render opinion as to the best way to biopsy the patient's lung mass.  Of her January 2021 chest x-ray performed prior to preoperative evaluation does not show any evidence of lung mass at that time.  The patient has a prior history of PE in December 2019 and has been followed by Dr. Earlie Server at the [facility]  Request.  Patient also carries a diagnosis of chronic obstructive pulmonary disease and mild pulmonary fibrosis associated with the same.  She has diastolic dysfunction and has also been noted to have tricuspid regurgitation on prior echo.  She has not had recent PFTs.  As noted previously she does not use inhalers and does not feel that she needs them.  Her RIGHT total hip revision was performed  at [facility] on 02 August 2019 had difficulties postoperatively due to poor pain control.  Issues with pain controlled with regards to the hip resolve however she developed new issues with right flank pain as noted above.  That has led to the work-up noted above.  Patient voices no other complaint today is just curious as to how to proceed for biopsy.   Review of Systems A 10 point review of systems was performed and it is as noted above otherwise negative.  Allergies  Allergen Reactions  . Ampicillin Anaphylaxis, Nausea Only and Swelling    SEVERE HEADACHE MUSCLE CRAMPS ANGIOEDEMA THRUSH PATIENT HAS HAD A PCN REACTION WITH IMMEDIATE RASH, FACIAL/TONGUE/THROAT SWELLING, SOB, OR LIGHTHEADEDNESS WITH HYPOTENSION:  #  #  YES  #  #  Has patient had a PCN reaction causing severe rash involving mucus membranes or skin necrosis: No Has patient had a PCN reaction that required hospitalization:No Has patient had a PCN reaction occurring within the last 10 years: No.  . Ampicillin-Sulbactam Sodium Anaphylaxis and Other (See Comments)    SEVERE HEADACHE MUSCLE CRAMPS ANGIOEDEMA THRUSH PATIENT HAS HAD A PCN REACTION WITH IMMEDIATE RASH, FACIAL/TONGUE/THROAT SWELLING, SOB, OR LIGHTHEADEDNESS WITH HYPOTENSION: # # YES # # Has patient had a PCN reaction causing severe rash involving mucus membranes or skin necrosis: No Has patient had a PCN reaction that required hospitalization:No Has patient had a PCN reaction occurring within  the last 10 years: No  SEVERE HEADACHE MUSCLE CRAMPS ANGIOEDEMA THRUSH PATIENT HAS HAD A PCN REACTION WITH IMMEDIATE RASH, FACIAL/TONGUE/THROAT SWELLING, SOB, OR LIGHTHEADEDNESS WITH HYPOTENSION: # # YES # # Has patient had a PCN reaction causing severe rash involving mucus membranes or skin necrosis: No Has patient had a PCN reaction that required hospitalization:No Has patient had a PCN reaction occurring within the last 10 years: No Muscles  draw up tight - severe headache angioedema  . Ambien [Zolpidem Tartrate] Nausea And Vomiting and Other (See Comments)    HALLUCINATIONS SWEATING  . Cyclobenzaprine Other (See Comments)    EXTRAPYRAMIDAL MOVEMENT INVOLUNTARY MUSCLE JERKING EXTRAPYRAMIDAL MOVEMENT INVOLUNTARY MUSCLE JERKING Gets the jerks  . Tape Itching, Rash and Other (See Comments)    Paper tape only. Adhesive tape=itching/burning/rash  . Toradol [Ketorolac Tromethamine] Nausea Only and Other (See Comments)    HEADACHE  BACKACHE  . Sulfamethoxazole-Trimethoprim     UNSPECIFIED REACTION   . Zolpidem     Hallucinations  . Cephalexin Rash and Other (See Comments)    HEADACHES  . Tramadol Rash and Other (See Comments)    HEADACHE    .    Objective:   Physical Exam BP (!) 142/86 (BP Location: Left Arm, Cuff Size: Normal)   Pulse (!) 103   Temp (!) 96.9 F (36.1 C) (Temporal)   Ht 5\' 5"  (1.651 m)   Wt 162 lb (73.5 kg)   SpO2 96%   BMI 26.96 kg/m   GENERAL: Chronically ill-appearing woman, somewhat befuddled, presents in transport chair.  Overweight. HEAD: Normocephalic, atraumatic.  EYES: Pupils equal, round, reactive to light.  No scleral icterus.  Ectropion OU. MOUTH: Nose/mouth/throat not examined due to masking requirements for COVID 19. NECK: Supple. No thyromegaly. No nodules. No JVD.  Trachea midline. PULMONARY: Coarse breath sounds throughout, no other adventitious sounds. CARDIOVASCULAR: S1 and S2.  Tachycardic, regular rate, grade 2/6 holosystolic ejection murmur left sternal border. GASTROINTESTINAL: Protuberant, nondistended, soft. MUSCULOSKELETAL: No joint deformity, no clubbing, no edema.  NEUROLOGIC: Limited assessment due to patient being on wheelchair.  There appears to be no focality.  Mild psychomotor retardation. SKIN: Intact,warm,dry.  Poor skin turgor, no rashes on limited skin exam. PSYCH: Mood and behavior normal.  Patient's imaging including CT scan of the chest and PET/CT:  These were independently reviewed, images were shown to the patient and the patient's daughter who presented with her today.  There is a 3 cm cavitary mass on the superior segment of the right lower lobe abutting the posterior chest wall.  This mass is amenable to biopsy via percutaneous core needle biopsy.  The adenopathy noted on PET/CT would be difficult to access with EBUS and may require EUS particularly the as azygo esophageal recess lymph node.   Assessment & Plan:     ICD-10-CM   1. Mass of lower lobe of right lung  R91.8 CT BIOPSY    CANCELED: CT BIOPSY   Will place request for CT-guided biopsy of the right lower lobe lung mass Per discussion with oncology Eliquis may be held for biopsy   2. Chronic obstructive pulmonary disease, unspecified COPD type ([place])  J44.9    Well compensated Patient not on inhalers  3. ILD (interstitial lung disease) ([facility])  J84.9    Mild interstitial lung disease without evidence of progression  4. History of pulmonary embolism  Z86.711    History of pulmonary embolism after prolonged illness 2019 No evidence of recurrence On chronic anticoagulation   Orders Placed  This Encounter  Procedures  . CT BIOPSY    Mass of lower right lung.  Per Dr. Patsey Berthold okay to stop Eliquis for procedures per Dr. Tasia Catchings.    Standing Status:   Future    Standing Expiration Date:   08/08/2020    Order Specific Question:   Reason for Exam (SYMPTOM  OR DIAGNOSIS REQUIRED)    Answer:   CT guided biopsy    Order Specific Question:   Preferred imaging location?    Answer:   [facility]   Discussion:  Patient will be scheduled for CT-guided needle biopsy of the right lower lobe mass.  Eliquis may be held for the procedure with resumption after safe from the procedure standpoint.  We will see the patient in follow-up sooner should any new problems arise.  We will stay in contact with her with regards to the results of her biopsy.  We will also stay in communication with  the oncology team.  The patient will need both medical and radiation oncology.  Radiation oncology may offer palliative relief of her pain with radiation to the mass.  Renold Don, MD [facility]   *This note was dictated using voice recognition software/Dragon.  Despite best efforts to proofread, errors can occur which can change the meaning.  Any change was purely unintentional.

## 2020-02-09 NOTE — Telephone Encounter (Signed)
Sure no problem. Contacted pt's daughter, [name], and explained current plan of care. All questions answered. Contact info provided for her to call with any more questions or concerns. Nothing further needed at this time.

## 2020-02-10 ENCOUNTER — Encounter: Payer: Self-pay | Admitting: Family Medicine

## 2020-02-11 ENCOUNTER — Telehealth: Payer: Self-pay | Admitting: Family Medicine

## 2020-02-11 DIAGNOSIS — M25561 Pain in right knee: Secondary | ICD-10-CM | POA: Diagnosis not present

## 2020-02-11 DIAGNOSIS — G894 Chronic pain syndrome: Secondary | ICD-10-CM | POA: Diagnosis not present

## 2020-02-11 DIAGNOSIS — Z79891 Long term (current) use of opiate analgesic: Secondary | ICD-10-CM | POA: Diagnosis not present

## 2020-02-11 DIAGNOSIS — M25551 Pain in right hip: Secondary | ICD-10-CM | POA: Diagnosis not present

## 2020-02-11 DIAGNOSIS — G8929 Other chronic pain: Secondary | ICD-10-CM | POA: Diagnosis not present

## 2020-02-11 DIAGNOSIS — R202 Paresthesia of skin: Secondary | ICD-10-CM | POA: Diagnosis not present

## 2020-02-11 NOTE — Telephone Encounter (Signed)
Pt's daughter wanted to call and let you know of pt's medication updates from the pain ctr as of today.  Updates as follows:  Fentyl patch 25mg  - wear one patch 3 days and then change out  Oxcycodone 10mg  every 8 hours as well  Also, they gave 2 Narcans in the event of accidental OD  Thank you!!

## 2020-02-12 ENCOUNTER — Other Ambulatory Visit: Payer: Self-pay | Admitting: Family Medicine

## 2020-02-12 DIAGNOSIS — M899 Disorder of bone, unspecified: Secondary | ICD-10-CM

## 2020-02-12 DIAGNOSIS — J439 Emphysema, unspecified: Secondary | ICD-10-CM

## 2020-02-12 DIAGNOSIS — M4802 Spinal stenosis, cervical region: Secondary | ICD-10-CM

## 2020-02-12 NOTE — Progress Notes (Signed)
d 

## 2020-02-15 ENCOUNTER — Ambulatory Visit (INDEPENDENT_AMBULATORY_CARE_PROVIDER_SITE_OTHER): Payer: Medicare Other

## 2020-02-15 ENCOUNTER — Other Ambulatory Visit: Payer: Self-pay

## 2020-02-15 DIAGNOSIS — R000 Tachycardia, unspecified: Secondary | ICD-10-CM | POA: Diagnosis not present

## 2020-02-15 DIAGNOSIS — R0600 Dyspnea, unspecified: Secondary | ICD-10-CM | POA: Diagnosis not present

## 2020-02-15 DIAGNOSIS — R0609 Other forms of dyspnea: Secondary | ICD-10-CM

## 2020-02-15 LAB — ECHOCARDIOGRAM COMPLETE
AR max vel: 1.79 cm2
AV Area VTI: 2.11 cm2
AV Area mean vel: 1.74 cm2
AV Mean grad: 8 mmHg
AV Peak grad: 12.7 mmHg
Ao pk vel: 1.79 m/s
Area-P 1/2: 2.53 cm2
S' Lateral: 3.1 cm

## 2020-02-16 ENCOUNTER — Encounter: Payer: Self-pay | Admitting: *Deleted

## 2020-02-16 ENCOUNTER — Encounter: Payer: Self-pay | Admitting: Family Medicine

## 2020-02-16 DIAGNOSIS — R918 Other nonspecific abnormal finding of lung field: Secondary | ICD-10-CM

## 2020-02-16 NOTE — Telephone Encounter (Signed)
Result note sent with information.

## 2020-02-18 ENCOUNTER — Other Ambulatory Visit: Payer: Self-pay

## 2020-02-18 ENCOUNTER — Other Ambulatory Visit
Admission: RE | Admit: 2020-02-18 | Discharge: 2020-02-18 | Disposition: A | Discharge status: Home / Self Care | Payer: Medicare Other | Source: Ambulatory Visit | Attending: Pulmonary Disease | Admitting: Pulmonary Disease

## 2020-02-18 ENCOUNTER — Other Ambulatory Visit: Payer: Self-pay | Admitting: Student

## 2020-02-18 DIAGNOSIS — Z01812 Encounter for preprocedural laboratory examination: Secondary | ICD-10-CM | POA: Diagnosis not present

## 2020-02-18 DIAGNOSIS — Z20822 Contact with and (suspected) exposure to covid-19: Secondary | ICD-10-CM | POA: Insufficient documentation

## 2020-02-18 LAB — SARS CORONAVIRUS 2 (TAT 6-24 HRS): SARS Coronavirus 2: NEGATIVE

## 2020-02-18 NOTE — OR Nursing (Signed)
Pre procedure instructions reviewed with daughter over phone. Pt has been holding her Eliquis, several days. She had PE and DVT in Nov 2019. Daughter informed only need to hold Eliquis for 48 hrs before biopsy and encouraged her to take dose today and in am but give tomorrows dose (9/25) before 10 am. NPO after midnight Monday except sip of water for medications.

## 2020-02-18 NOTE — Progress Notes (Signed)
°  Oncology Nurse Navigator Documentation  Navigator Location: [facility] (02/16/20 1200)   )Navigator Encounter Type: Appt/Treatment Plan Review;Telephone (02/16/20 1200) Telephone: Outgoing Call (02/16/20 1200) Abnormal Finding Date: 01/03/20 (02/16/20 1200)                     Barriers/Navigation Needs: Coordination of Care (02/16/20 1200)   Interventions: Coordination of Care (02/16/20 1200)   Coordination of Care: Appts;Radiology (02/16/20 1200)     called pt's daughter to review upcoming appts. Pt's daughter made aware of follow up visit with Dr. Tasia Catchings on 10/1 and brain MRI on 10/9. All questions answered during call. Nothing further needed at this time. Instructed to call back with any further questions or needs. Pt's daughter verbalized understanding.             Time Spent with Patient: 30 (02/16/20 1200)

## 2020-02-19 IMAGING — CT CT ANGIO CHEST
2 of 6 series · 17 of 46 positions shown · IV contrast (APPLIED)
Comparison: 04/30/2018 and 10/23/2016

CLINICAL DATA: Shortness of breath with nonproductive cough and
nausea. Wheezing.

EXAM:
CT ANGIOGRAPHY CHEST WITH CONTRAST
TECHNIQUE: Multidetector CT imaging of the chest was performed using the
standard protocol during bolus administration of intravenous
contrast. Multiplanar CT image reconstructions and MIPs were
obtained to evaluate the vascular anatomy.
CONTRAST:  75mL 9915ZO-TAT IOPAMIDOL (9915ZO-TAT) INJECTION 76%

[Series 5: thins · axial · 0.57mm/px · z∈[-327,-89]mm · 14 of 262 slices shown]
[im 12/262  lung]
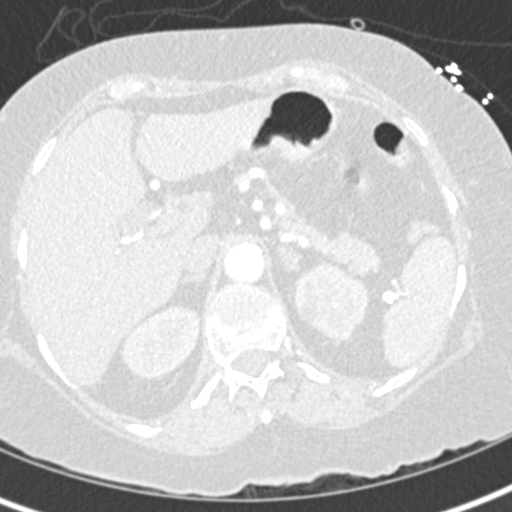
[im 35/262  soft-tissue]
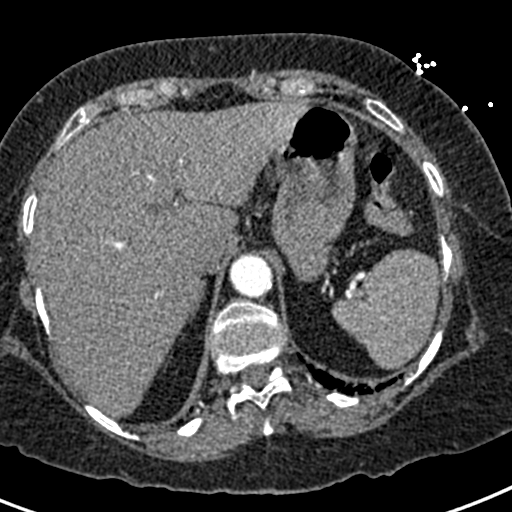
[im 46/262  lung]
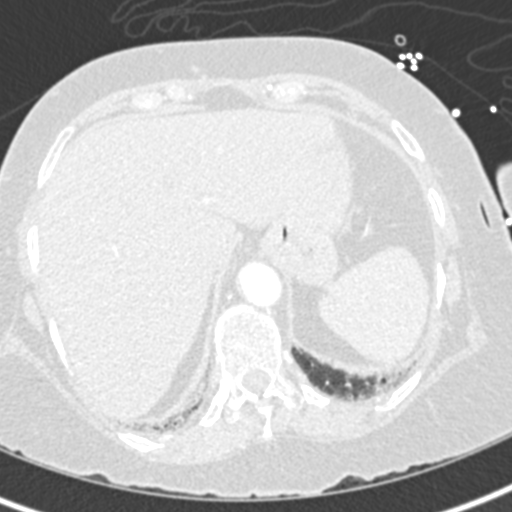
[im 69/262  soft-tissue]
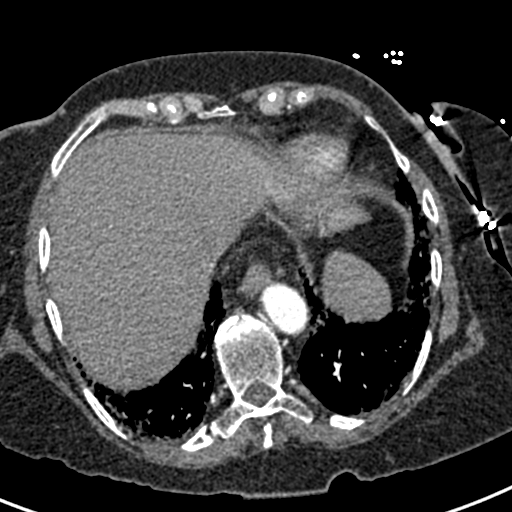
[im 91/262  lung]
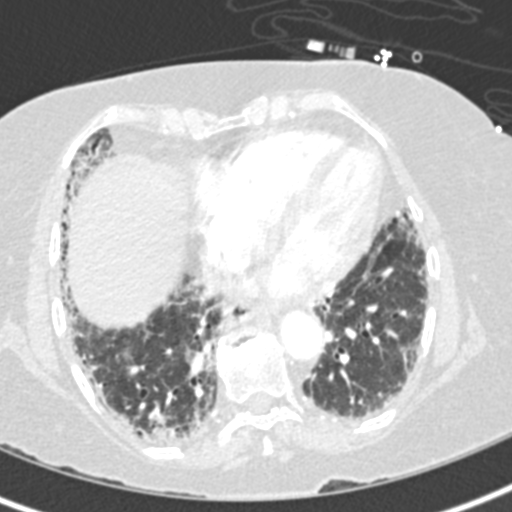
[im 103/262  soft-tissue]
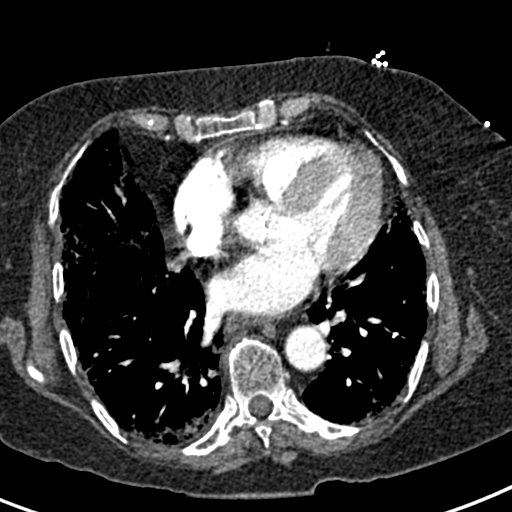
[im 125/262  lung]
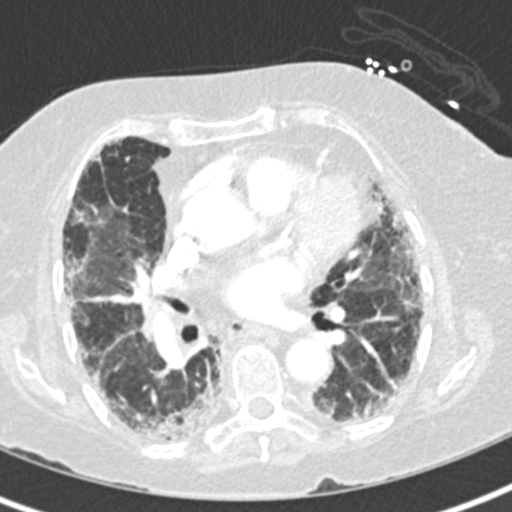
[im 137/262  soft-tissue]
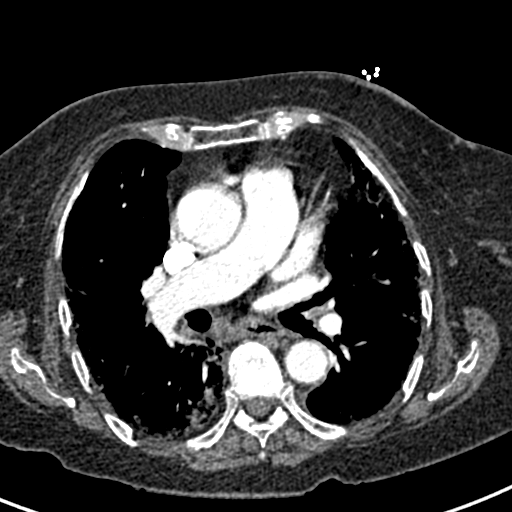
[im 159/262  lung]
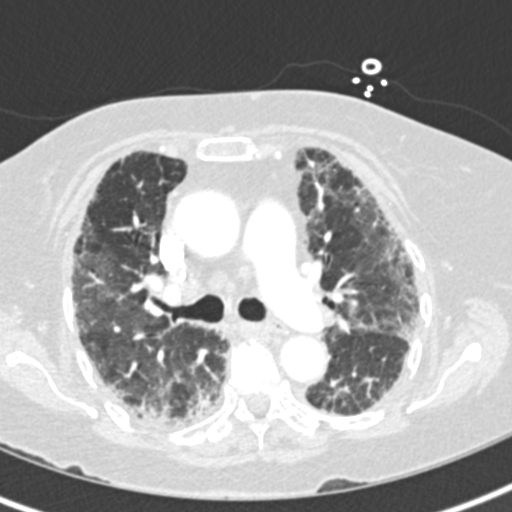
[im 171/262  soft-tissue]
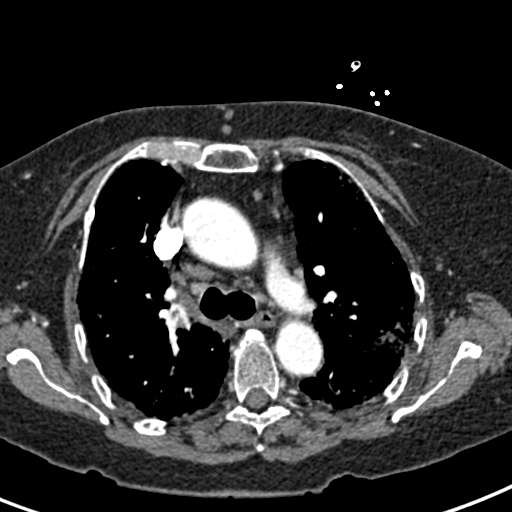
[im 193/262  lung]
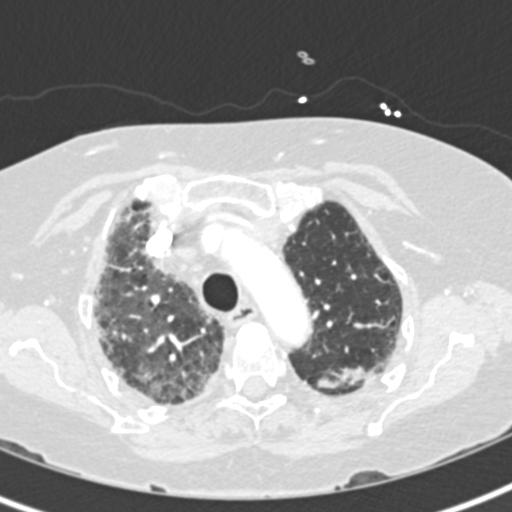
[im 216/262  soft-tissue]
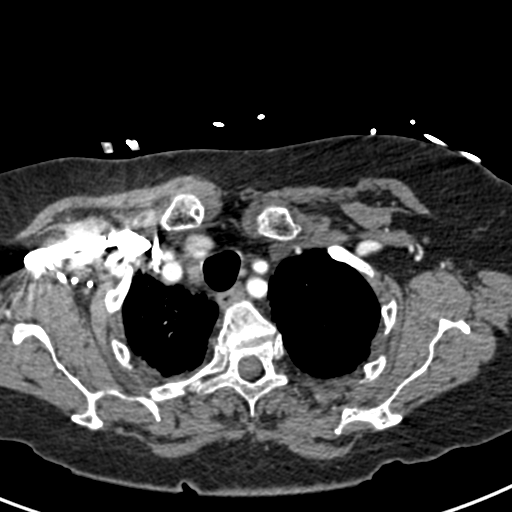
[im 227/262  lung]
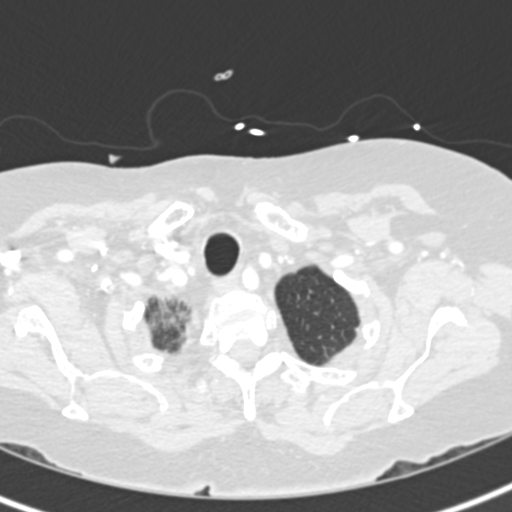
[im 250/262  soft-tissue]
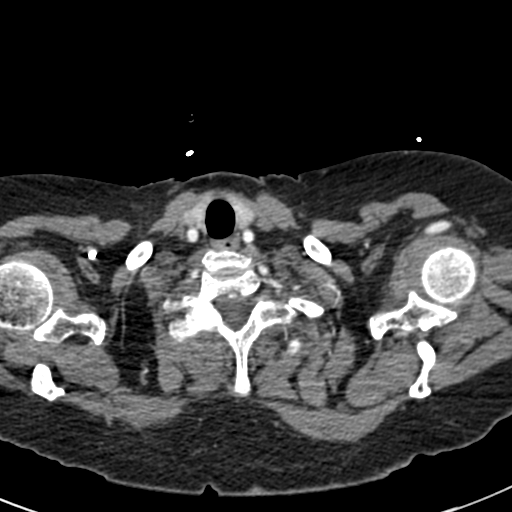

[Series 7: coronal mpr · coronal · 0.51mm/px · 3 of 85 slices shown]
[im 22/85  soft-tissue]
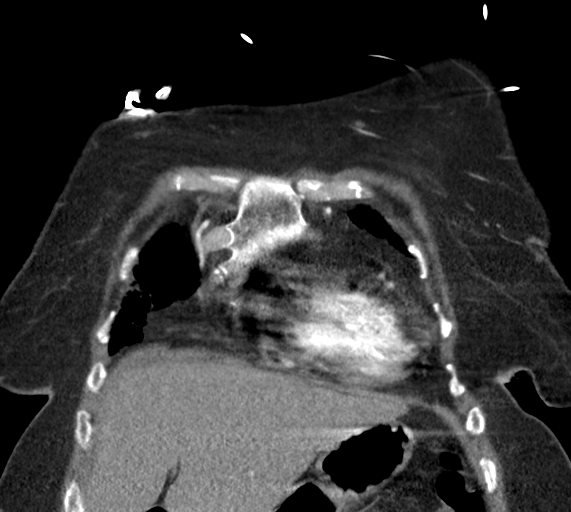
[im 43/85  soft-tissue]
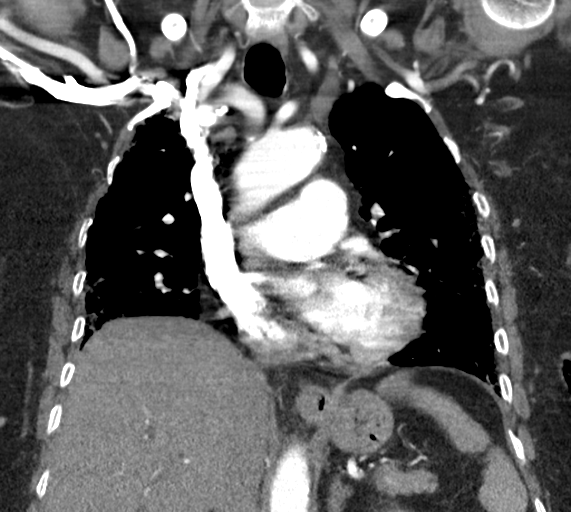
[im 64/85  soft-tissue]
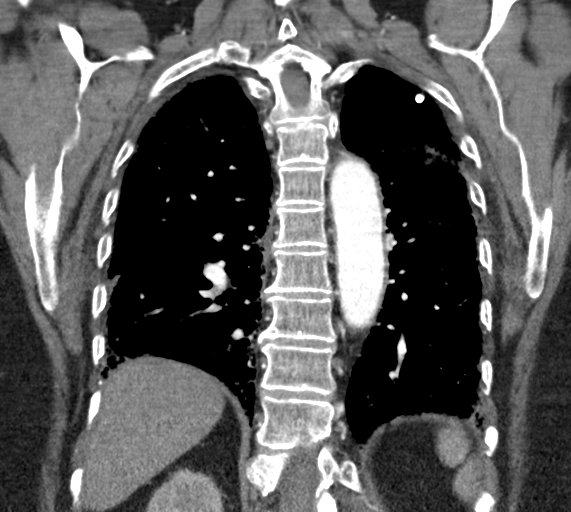

[17 of 46 positions shown; findings below may reference images not displayed]

FINDINGS: Cardiovascular: Mild prominence of the heart unchanged. Minimal
calcified plaque over the thoracic aorta. Mild stable prominence of
the ascending thoracic aorta measuring 3.6 cm in AP diameter. Very
minimal residual emboli visualized over the proximal right upper
lobar pulmonary artery. Resolution of previously seen right lower
lobar and left upper lobe pulmonary emboli.

Mediastinum/Nodes: 1.1 cm precarinal lymph node. 1.2 cm subcarinal
lymph node. 1 cm anterior right hilar lymph node. 1.1 cm posterior
right hilar lymph node. These nodes are slightly larger, but likely
reactive. Remaining mediastinal structures are unremarkable.

Lungs/Pleura: Lungs are adequately inflated demonstrate patchy
bilateral peripheral fibrotic change over the mid to lower lungs.
Calcified granuloma over the posterior left upper lobe. Significant
interval improvement of the previously seen bilateral patchy
airspace process with minimal residual density over the posterior
left upper lobe. Calcified granuloma left lower lobe. No effusion.
Airways are unremarkable..

Upper Abdomen: 1 cm hypodensity over the upper pole cortex left
kidney unchanged likely a cyst. No acute findings.

Musculoskeletal: Minimal soft tissue prominence/fluid adjacent the
left sternal clavicular joint. Mild degenerate change of the spine.
Remainder of the exam is unchanged.

Review of the MIP images confirms the above findings.
IMPRESSION: Interval improvement in known pulmonary emboli with minimal residual
embolus over the proximal right upper lobar pulmonary artery.

Significant interval improvement of previously noted patchy
bilateral airspace process with minimal residual opacification over
the posterior left upper Jira which may be resolving infection e.
Chronic fibrotic change. Reactive mediastinal nodes.

Mild prominence of the ascending thoracic aorta measuring 3.6 cm in
AP diameter. Recommend annual imaging followup by CTA or MRA. This
recommendation follows 1797
ACCF/AHA/AATS/ACR/ASA/SCA/BRENS/RHODRAE/MASRI/CHRISTOPHORA Guidelines for the
Diagnosis and Management of Patients with Thoracic Aortic Disease.
Circulation.1797; 121: [id number].

Aortic Atherosclerosis ([id number].Y).

Mild soft tissue thickening/fluid adjacent the left sternoclavicular
joint. May be due to infectious or inflammatory process.

## 2020-02-19 IMAGING — DX DG CHEST 1V PORT
1 series · 1 of 1 positions shown · non-contrast
Comparison: 04/30/2018, 05/03/2018

CLINICAL DATA: Shortness of breath, tachycardia, COPD, recent
pneumonia and PE 04/30/2018

EXAM:
PORTABLE CHEST 1 VIEW

[chest ap]
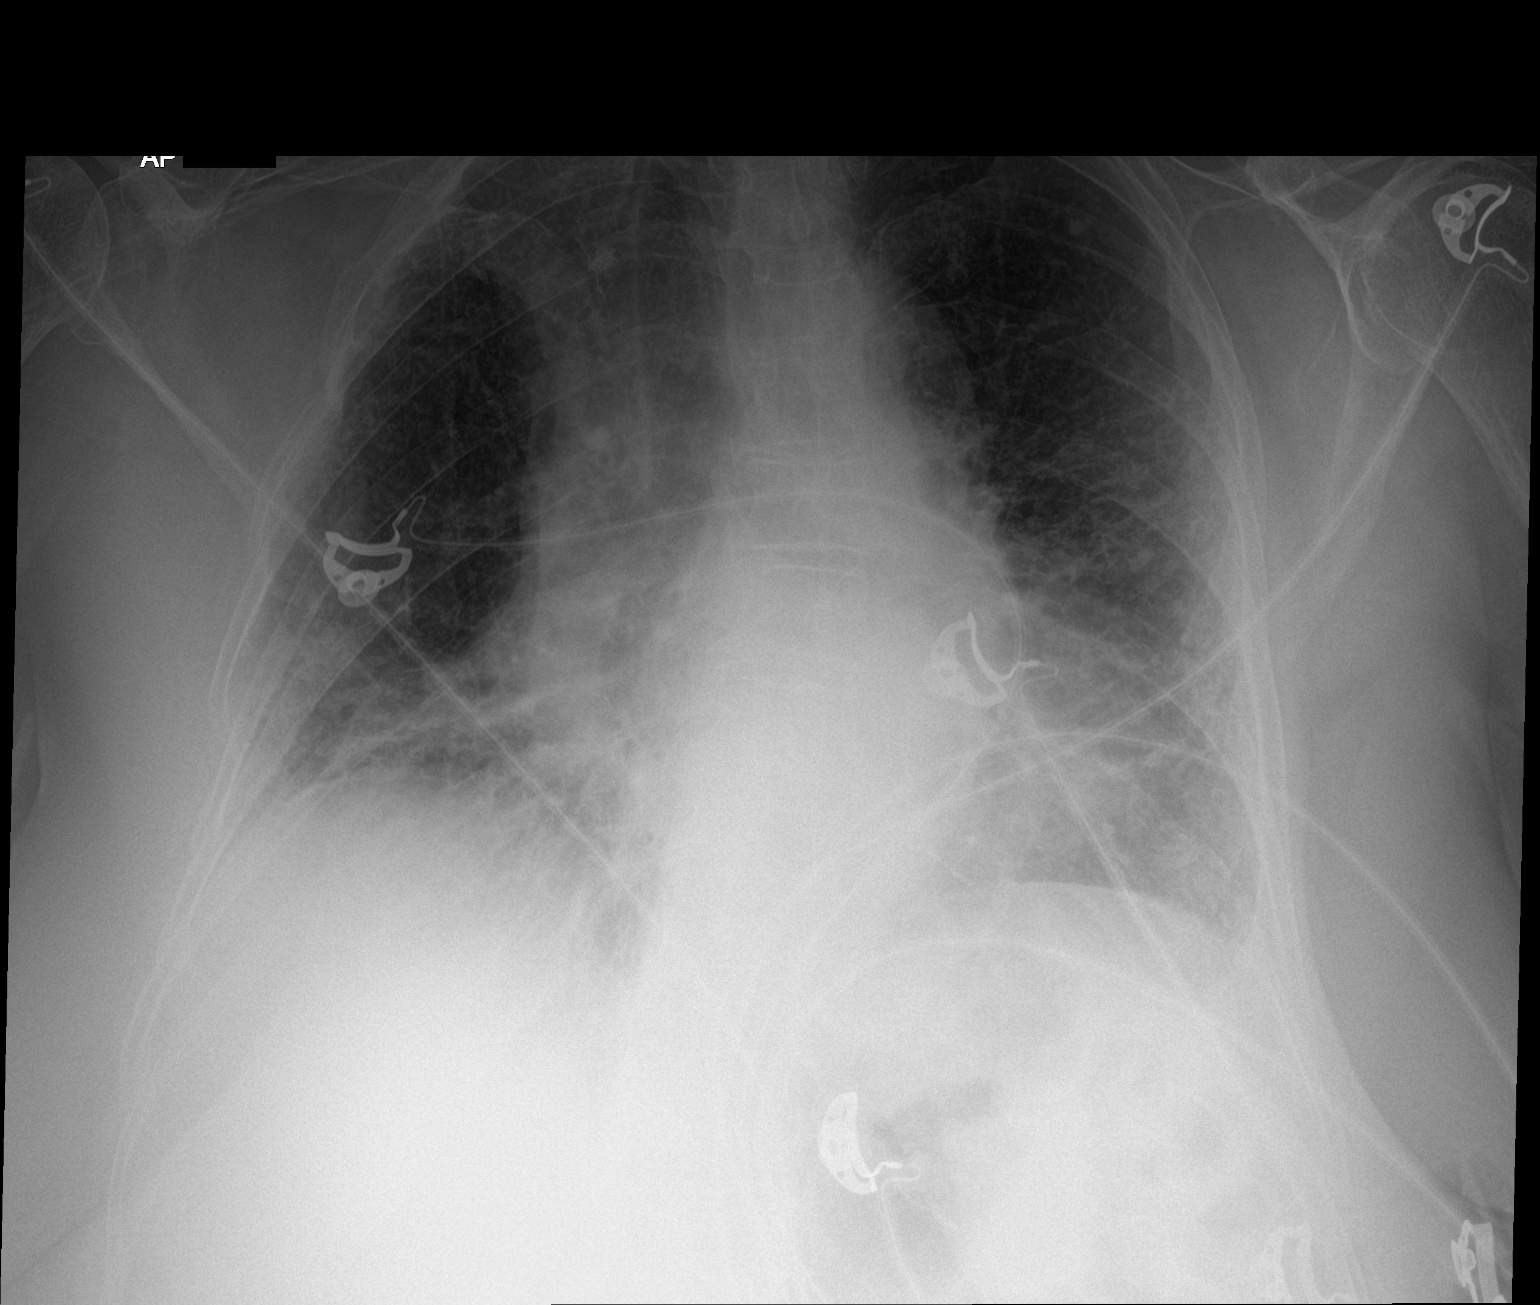

[1 of 1 positions shown; findings below may reference images not displayed]

FINDINGS: Persistent cardiomegaly with some improvement in patchy bilateral
airspace opacities. However there is persistent diffuse interstitial
prominence which may represent a component of residual edema. No
large effusion or pneumothorax. Scoliosis of the spine noted. Exam
is rotated to the right.
IMPRESSION: Cardiomegaly with persistent interstitial prominence may represent
chronic lung disease or mild residual edema pattern

Improving patchy bilateral airspace process particularly in the
lower lobes compatible with resolving pneumonia.

## 2020-02-21 ENCOUNTER — Other Ambulatory Visit: Payer: Self-pay

## 2020-02-21 ENCOUNTER — Other Ambulatory Visit: Payer: Medicare Other

## 2020-02-21 ENCOUNTER — Ambulatory Visit
Admission: RE | Admit: 2020-02-21 | Discharge: 2020-02-21 | Disposition: A | Discharge status: Home / Self Care | Payer: Medicare Other | Source: Ambulatory Visit | Attending: Pulmonary Disease | Admitting: Pulmonary Disease

## 2020-02-21 DIAGNOSIS — R918 Other nonspecific abnormal finding of lung field: Secondary | ICD-10-CM | POA: Diagnosis not present

## 2020-02-21 DIAGNOSIS — C3431 Malignant neoplasm of lower lobe, right bronchus or lung: Secondary | ICD-10-CM | POA: Diagnosis not present

## 2020-02-21 LAB — PROTIME-INR
INR: 0.9 (ref 0.8–1.2)
Prothrombin Time: 12.2 seconds (ref 11.4–15.2)

## 2020-02-21 MED ORDER — MIDAZOLAM HCL 2 MG/2ML IJ SOLN
INTRAMUSCULAR | Status: AC | PRN
  Administered 2020-02-21 (×2): 1 mg via INTRAVENOUS

## 2020-02-21 MED ORDER — FENTANYL CITRATE (PF) 100 MCG/2ML IJ SOLN
INTRAMUSCULAR | Status: DC
Start: 2020-02-21 — End: 2020-02-22
  Filled 2020-02-21: qty 2

## 2020-02-21 MED ORDER — SODIUM CHLORIDE 0.9 % IV SOLN: INTRAVENOUS | Status: DC

## 2020-02-21 MED ORDER — MIDAZOLAM HCL 2 MG/2ML IJ SOLN
INTRAMUSCULAR | Status: AC
  Filled 2020-02-21: qty 2

## 2020-02-21 MED ORDER — FENTANYL CITRATE (PF) 100 MCG/2ML IJ SOLN
INTRAMUSCULAR | Status: AC | PRN
  Administered 2020-02-21 (×2): 50 ug via INTRAVENOUS

## 2020-02-21 NOTE — Procedures (Signed)
Interventional Radiology Procedure Note  Procedure: CT Guided Biopsy of RLL lung mass  Complications: None  Estimated Blood Loss: < 10 mL  Findings: 18 G core biopsy of RLL lung mass performed under CT guidance.  Two core samples obtained and sent to Pathology.  Venetia Night. Kathlene Cote, M.D Pager:  [phone]

## 2020-02-21 NOTE — H&P (Signed)
Chief Complaint: RLL nodule. Request is for RLL lung biopsy   Referring Physician(s): Tyler Pita  Supervising Physician: Aletta Edouard  Patient Status: [facility] - Out-pt  History of Present Illness: [name] is a 79 y.o. female History of HLD, HTN, skin cancer, chronic ambulation issues, pulmonary fibrosis (on 3L home 02), PE (12.5.19) and DVT to left poster tibial and peroneal vein on eliquis. Patient was found to have a RLL nodule with right pleural thickening while being worked up for right RUQ and right flank pain.  PET from 8.31.21 Persistent right lower lobe lung mass, consistent with primary bronchogenic carcinoma . Team is requesting a RLL biopsy for further evaluation.   Past Medical History:  Diagnosis Date  . Acute postoperative pain 02/03/2018  . Anemia   . B12 deficiency   . Bacteremia 10/07/2014  . Bladder incontinence   . Blind left eye   . Cancer ([place])    skin cancer   . Cataract   . Cervical spine fracture ([place])   . Chest pain 07/29/2013  . Compression fracture    C7,  upper T spine -compression fx  . COPD (chronic obstructive pulmonary disease) ([place])   . DDD (degenerative disc disease), lumbar   . Depression    major  . Diffuse myofascial pain syndrome 03/24/2015  . Dyspnea    at times- when activty  . Fever 10/05/2014  . GERD (gastroesophageal reflux disease)   . Hiatal hernia   . Hyperlipidemia   . Hypertension   . 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 Macular degeneration   . On home oxygen therapy    "3L; all the time" (04/30/2018)  . Osteoarthritis   . Osteoporosis   . Pneumonia    years ago  . Pulmonary embolism ([place]) 04/30/2018  . Reactive airway disease   . Rupture of bowel ([place])   . Sepsis ([place]) 10/08/2014  . Stroke [facility])    "mini stroke"- balance and memory issue  . Stroke ([place])   . Wrist pain, acute 09/10/2012    Past Surgical History:  Procedure Laterality Date  . ABDOMINAL HYSTERECTOMY    . ABDOMINAL  SURGERY    . APPENDECTOMY    . BLADDER SURGERY    . CATARACT EXTRACTION W/ INTRAOCULAR LENS  IMPLANT, BILATERAL Bilateral   . CHOLECYSTECTOMY    . COLON SURGERY    . COLOSTOMY REVERSAL    . ESOPHAGOGASTRODUODENOSCOPY N/A 10/30/2018   Procedure: ESOPHAGOGASTRODUODENOSCOPY (EGD);  Surgeon: Lin Landsman, MD;  Location[facility]COPY;  Service: Gastroenterology;  Laterality: N/A;  . INTRAMEDULLARY (IM) NAIL INTERTROCHANTERIC Right 09/13/2016   Procedure: INTRAMEDULLARY (IM) NAIL INTERTROCHANTRIC RIGHT;  Surgeon: Leandrew Koyanagi, MD;  Location: [facility];  Service: Orthopedics;  Laterality: Right;  . JOINT REPLACEMENT Bilateral   . JOINT REPLACEMENT    . KNEE SURGERY    . OSTOMY    . RECTOCELE REPAIR    . TOE SURGERY Right    3rd  . TOTAL HIP ARTHROPLASTY Right 09/30/2017   Procedure: RIGHT HIP IM NAIL REMOVAL WITH CONVERSION TO TOTAL HIP ARTHOPLASTY, anterior approach;  Surgeon: Renette Butters, MD;  Location: [place];  Service: Orthopedics;  Laterality: Right;    Allergies: Ampicillin, Ampicillin-sulbactam sodium, Ambien [zolpidem tartrate], Cyclobenzaprine, Tape, Toradol [ketorolac tromethamine], Sulfamethoxazole-trimethoprim, Zolpidem, Cephalexin, and Tramadol  Medications: Prior to Admission medications   Medication Sig Start Date End Date Taking? Authorizing Provider  acetaminophen (TYLENOL) 500 MG tablet Take 2 tablets (1,000 mg total) by mouth in  the morning, at noon, and at bedtime. 02/03/20   Duffy Bruce, MD  albuterol (ACCUNEB) 1.25 MG/3ML nebulizer solution Take 3 mLs (1.25 mg total) by nebulization every 6 (six) hours as needed for wheezing. 08/26/18   [name], FNP  albuterol (VENTOLIN HFA) 108 (90 Base) MCG/ACT inhaler Inhale 2 puffs into the lungs every 6 (six) hours as needed for wheezing or shortness of breath. 09/25/18   [name], FNP  alendronate (FOSAMAX) 70 MG tablet TAKE 1 TABLET BY MOUTH ONCE A WEEK 06/09/19   [name], FNP  ALPRAZolam Duanne Moron)  1 MG tablet Take 0.5-1 tablets (0.5-1 mg total) by mouth 2 (two) times daily as needed for anxiety. 01/17/20   [name], FNP  amLODipine (NORVASC) 5 MG tablet TAKE 1 TABLET BY MOUTH EVERY DAY 08/30/19   [name], FNP  diclofenac Sodium (VOLTAREN) 1 % GEL Apply 4 g topically in the morning, at noon, and at bedtime. Around or in place of lidoderm patch 02/03/20   Duffy Bruce, MD  docusate sodium (COLACE) 100 MG capsule Take 200 mg by mouth 2 (two) times daily as needed for moderate constipation.     [provider]  DULoxetine (CYMBALTA) 20 MG capsule TAKE 2 CAPSULES BY MOUTH EVERY DAY 12/23/19   [name], FNP  ELIQUIS 2.5 MG TABS tablet TAKE 1 TABLET BY MOUTH TWICE A DAY 09/04/19   Earlie Server, MD  esomeprazole (NEXIUM) 40 MG capsule TAKE 1 CAPSULE BY MOUTH EVERY DAY 09/15/19   [name], FNP  FLOVENT HFA 220 MCG/ACT inhaler TAKE 1 PUFF BY MOUTH TWICE A DAY 12/14/19   [name], FNP  fluticasone [facility]) 50 MCG/ACT nasal spray SPRAY 2 SPRAYS INTO EACH NOSTRIL EVERY DAY 08/25/19   [name], FNP  LINZESS 290 MCG CAPS capsule TAKE 1 CAPSULE BY MOUTH EVERY DAY 08/30/19   [name], FNP  lovastatin (MEVACOR) 10 MG tablet TAKE 1 TABLET BY MOUTH EVERY DAY 11/03/19   [name], FNP  mirtazapine (REMERON) 7.5 MG tablet TAKE 1 TABLET (7.5 MG TOTAL) BY MOUTH AT BEDTIME. 02/01/20   Bedsole, Amy E, MD  NARCAN 4 MG/0.1ML LIQD nasal spray kit  09/10/18   [provider]  ondansetron (ZOFRAN ODT) 4 MG disintegrating tablet Take 1 tablet (4 mg total) by mouth every 8 (eight) hours as needed for nausea or vomiting. 06/12/18   [name], FNP  orphenadrine (NORFLEX) 100 MG tablet 100 mg as needed. 11/11/19   [provider]  Oxycodone HCl 10 MG TABS Take 10 mg by mouth 3 (three) times daily. 09/23/18   [provider]  polyethylene glycol (MIRALAX / GLYCOLAX) packet Take 17 g by mouth daily as needed (for  constipation.).     [provider]  pregabalin (LYRICA) 100 MG capsule TAKE 1 CAPSULE BY MOUTH TWICE A DAY 06/11/19   [name], FNP  promethazine (PHENERGAN) 12.5 MG tablet Take 1 tablet (12.5 mg total) by mouth every 8 (eight) hours as needed for nausea or vomiting. 02/08/20   [name], FNP  traZODone (DESYREL) 50 MG tablet TAKE 0.5-1.5 TABLETS (25-75 MG TOTAL) BY MOUTH AT BEDTIME AS NEEDED FOR SLEEP. Patient not taking: Reported on 02/09/2020 10/27/19   [name], FNP     Family History  Problem Relation Age of Onset  . Heart failure Mother   . Heart attack Father   . Lung cancer Sister   .  Deep vein thrombosis Sister   . Pulmonary embolism Son   . Deep vein thrombosis Son   . Cervical cancer Daughter     Social History   Socioeconomic History  . Marital status: Widowed    Spouse name: Not on file  . Number of children: 3  . Years of education: middle sch  . Highest education level: Not on file  Occupational History  . Occupation: retired    Comment: n/a  Tobacco Use  . Smoking status: Former Smoker    Packs/day: 1.00    Years: 30.00    Pack years: 30.00    Types: Cigarettes    Quit date: 1990    Years since quitting: 31.7  . Smokeless tobacco: Never Used  Vaping Use  . Vaping Use: Former  Substance and Sexual Activity  . Alcohol use: Not Currently  . Drug use: Not Currently  . Sexual activity: Not Currently  Other Topics Concern  . Not on file  Social History Narrative   ** Merged History Encounter **       ** Merged History Encounter **       Patient lives at home with daughter. Caffeine Use: 4 cups daily   Social Determinants of Health   Financial Resource Strain: Low Risk   . Difficulty of Paying Living Expenses: Not hard at all  Food Insecurity: No Food Insecurity  . Worried About Charity fundraiser in the Last Year: Never true  . Ran Out of Food in the Last Year: Never true  Transportation Needs: No Transportation  Needs  . Lack of Transportation (Medical): No  . Lack of Transportation (Non-Medical): No  Physical Activity: Inactive  . Days of Exercise per Week: 0 days  . Minutes of Exercise per Session: 0 min  Stress: Stress Concern Present  . Feeling of Stress : Very much  Social Connections:   . Frequency of Communication with Friends and Family: Not on file  . Frequency of Social Gatherings with Friends and Family: Not on file  . Attends Religious Services: Not on file  . Active Member of Clubs or Organizations: Not on file  . Attends Archivist Meetings: Not on file  . Marital Status: Not on file      Review of Systems: A 12 point ROS discussed and pertinent positives are indicated in the HPI above.  All other systems are negative.  Review of Systems  Constitutional: Negative for fatigue and fever.  HENT: Negative for congestion.   Respiratory: Negative for cough and shortness of breath.   Gastrointestinal: Negative for abdominal pain, diarrhea, nausea and vomiting.    Vital Signs: There were no vitals taken for this visit.  Physical Exam Vitals and nursing note reviewed.  Constitutional:      Appearance: She is well-developed.  HENT:     Head: Normocephalic and atraumatic.  Eyes:     Conjunctiva/sclera: Conjunctivae normal.  Cardiovascular:     Rate and Rhythm: Normal rate and regular rhythm.     Heart sounds: Normal heart sounds.  Pulmonary:     Effort: Pulmonary effort is normal.     Comments: On 3L [place] per baseline. Breath sounds diminished.  Musculoskeletal:        General: Normal range of motion.     Cervical back: Normal range of motion.  Skin:    General: Skin is warm.  Neurological:     Mental Status: She is alert and oriented to person, place, and time.  Imaging: DG Chest 1 View  Result Date: 02/03/2020 CLINICAL DATA:  Right-sided chest pain. Recent lung cancer diagnosis. EXAM: CHEST  1 VIEW COMPARISON:  Radiograph and CT 3 days ago 01/31/2020  FINDINGS: Stable heart size and mediastinal contours. Rounded density projecting over the medial right lung base corresponds to lung mass on CT. No acute airspace disease. No developing pleural effusion. No pneumothorax. Chronic interstitial coarsening. Stable osseous structures. IMPRESSION: 1. Stable radiographic appearance of the chest. 2. Rounded density projecting over the medial right lung base corresponds to lung mass on CT. Electronically Signed   By: Keith Rake M.D.   On: 02/03/2020 21:14   DG Chest 2 View  Result Date: 01/31/2020 CLINICAL DATA:  Right-sided chest pain. EXAM: CHEST - 2 VIEW COMPARISON:  Chest CT January 03, 2020 FINDINGS: Cardiomediastinal silhouette is normal. Mediastinal contours appear intact. Tortuosity of the aorta. Right lower lobe lung mass is not changed radiographically. Coarsening of the interstitium. Osseous structures are without acute abnormality. Soft tissues are grossly normal. IMPRESSION: 1. Right lower lobe lung mass is not changed radiographically. 2. Coarsening of the interstitium. Electronically Signed   By: Fidela Salisbury M.D.   On: 01/31/2020 12:28   CT Angio Chest PE W and/or Wo Contrast  Result Date: 01/31/2020 CLINICAL DATA:  Lung cancer, chest pain EXAM: CT ANGIOGRAPHY CHEST WITH CONTRAST TECHNIQUE: Multidetector CT imaging of the chest was performed using the standard protocol during bolus administration of intravenous contrast. Multiplanar CT image reconstructions and MIPs were obtained to evaluate the vascular anatomy. CONTRAST:  80m OMNIPAQUE IOHEXOL 350 MG/ML SOLN COMPARISON:  01/03/2020 FINDINGS: Cardiovascular: Excellent opacification of the pulmonary arterial tree. No intraluminal filling defect identified to suggest acute pulmonary embolism. The central pulmonary arteries are mildly enlarged in keeping with changes of pulmonary arterial hypertension, stable since prior examination. Cardiac size is within normal limits. No pericardial  effusion. The thoracic aorta is of normal caliber with mild atherosclerotic calcification noted within its descending segment. Mediastinum/Nodes: Pathologic as ago esophageal recess node is again identified, stable at 13 mm x 16 mm at axial image 40/3 shotty right paratracheal and precarinal adenopathy is again identified without frank pathologic enlargement. Thyroid unremarkable. Esophagus unremarkable. Lungs/Pleura: The pleural based right posterior basal lower lobe pulmonary mass is again identified and is unchanged measuring 2.7 x 3.9 cm in greatest dimension at axial image # 47/5. Central necrosis with a small amount of intraluminal gas is again identified. The mass abuts the pleural margin, however, there is no definite extension into the chest wall itself. No pneumothorax or pleural effusion. Mild subpleural pulmonary fibrosis is again identified diffusely. No new focal pulmonary infiltrate. Central airways are widely patent. Upper Abdomen: Multiple simple cortical cysts are seen within the left kidney. There is surgical changes of cholecystectomy identified with moderate to severe extrahepatic biliary ductal dilation. 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 This may represent bulbous distension of the ampulla into the duodenal lumen as can be seen with ampullary stenosis or a primary neoplasm of the ampulla. The lack of significant metabolic activity on prior PET CT examination favors the former. Musculoskeletal: No acute bone abnormality. Review of the MIP images confirms the above findings. IMPRESSION: 1. No evidence of acute pulmonary embolism. 2. Stable size and appearance of right lower lobe pulmonary mass and associated pathologic azygoesophageal recess node. 3. Moderate to severe extrahepatic biliary ductal dilation with an ampullary  mass better identified on the current examination. This  may represent bulbous distension of the ampulla into the duodenal lumen as can be seen with ampullary stenosis or a primary neoplasm of the ampulla. The lack of significant metabolic activity on prior PET CT examination favors the former. Correlation with endoscopy would be helpful for further evaluation. Aortic Atherosclerosis (ICD10-I70.0). Electronically Signed   By: Fidela Salisbury MD   On: 01/31/2020 16:30   NM PET Image Initial (PI) Skull Base To Thigh  Result Date: 01/25/2020 CLINICAL DATA:  Initial treatment strategy for chest CT demonstrating right lower lobe pneumonia versus mass. EXAM: NUCLEAR MEDICINE PET SKULL BASE TO THIGH TECHNIQUE: 8.9 mCi F-18 FDG was injected intravenously. Full-ring PET imaging was performed from the skull base to thigh after the radiotracer. CT data was obtained and used for attenuation correction and anatomic localization. Fasting blood glucose: 89 mg/dl COMPARISON:  CTA chest 01/03/2020.  Abdominopelvic CT 01/03/2020. FINDINGS: Mediastinal blood pool activity: SUV max 2.0 Liver activity: SUV max NA NECK: No areas of abnormal hypermetabolism. Incidental CT findings: No cervical adenopathy. Bilateral carotid atherosclerosis. CHEST: Pleural-based right lower lobe partially cavitary lung mass persists. 3.3 cm and a S.U.V. max of 18.5, including on 93/4. Hypermetabolic adenopathy within the azygoesophageal recess. 1.4 cm and a S.U.V. max of 16.2 on 85/4. A low right paratracheal node measures 7 mm and a S.U.V. max of 2.7 on 81/4, equivocal. Incidental CT findings: Similar trace right pleural fluid or thickening. Cardiomegaly. Aortic and coronary artery atherosclerosis. Interstitial lung disease, suboptimally evaluated. Concurrent centrilobular emphysema. ABDOMEN/PELVIS: No abdominopelvic parenchymal or nodal hypermetabolism. Incidental CT findings: Deferred to recent diagnostic CT. 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 Abdominal aortic atherosclerosis. No acute  superimposed process. Pelvic degradation secondary to right hip arthroplasty. SKELETON: No suspicious marrow hypermetabolism. Superficial to the proximal right femur is subcutaneous soft tissue thickening and mild hypermetabolism, including at a S.U.V. max of 3.4 on 256/4. Likely postoperative. Incidental CT findings: Osteopenia. Upper thoracic vertebral body height loss was present on 01/03/2020. IMPRESSION: 1. Persistent right lower lobe lung mass, consistent with primary bronchogenic carcinoma. 2. Hypermetabolic node within the azygoesophageal recess, consistent with nodal metastasis. A right paratracheal node is equivocal. 3. No evidence of hypermetabolic extrathoracic metastatic disease. Electronically Signed   By: Abigail Miyamoto M.D.   On: 01/25/2020 20:20   CT T-SPINE NO CHARGE  Result Date: 01/31/2020 CLINICAL DATA:  Lung cancer, right chest pain EXAM: CT THORACIC SPINE WITHOUT CONTRAST TECHNIQUE: Multidetector CT images of the thoracic were obtained using the standard protocol without intravenous contrast. COMPARISON:  None. Findings are correlated with PET CT examination of 01/25/2020 and concurrently performed CT arteriogram of the chest FINDINGS: Alignment: Normal thoracic kyphosis.  No listhesis. Vertebrae: Mild anterior wedge compression fracture T1 and T3 are identified with approximately 40% and 20% loss of height, respectively. No acute fracture of the thoracic spine. There is no lytic or blastic bone lesion identified involving the vertebral bodies of the thoracic spine. There is, however, subtle erosion of the a right eighth rib just lateral to the a costovertebral junction, best seen on axial image # 67/11 and 67/10 in keeping with chest wall invasion and direct involvement of the a thoracic cage by the primary mass within the posterior basal right lower lobe. Paraspinal and other soft tissues: Posterior basal right lower lobe pleural based central necrotic pulmonary mass again identified. Mild  subpleural pulmonary fibrosis again noted. Pathologic as ago esophageal recess noted. Multiple simple cortical  cysts are seen within the visualized left kidney. Disc levels: Intervertebral disc height has been preserved though there are discogenic changes noted predominantly at T5-T11 in keeping with changes of mild to moderate degenerative disc disease. Review of the axial images demonstrates no significant canal stenosis. No significant neural foraminal narrowing. IMPRESSION: 1. No lytic or blastic bone lesion identified involving the vertebral bodies of the thoracic spine. 2. Mild anterior wedge compression fractures of T1 and T3 are identified with approximately 40% and 20% loss of height, respectively. These appear chronic in nature. 3. No significant canal stenosis or neural foraminal narrowing is seen within the thoracic spine. 4. Posterior basal right lower lobe pleural based central necrotic pulmonary mass again identified. There is subtle erosion of the right eighth rib just lateral to the a costovertebral junction in keeping with chest wall invasion and direct involvement of the a thoracic cage by the primary mass within the posterior basal right lower lobe. Electronically Signed   By: Fidela Salisbury MD   On: 01/31/2020 16:41   ECHOCARDIOGRAM COMPLETE  Result Date: 02/15/2020    ECHOCARDIOGRAM REPORT   Patient Name:   AMAREE LOISEL Date of Exam: 02/15/2020 Medical Rec #:  [id number]       Height:       65.0 in Accession #:    [id number]      Weight:       162.0 lb Date of Birth:  1940-10-13        BSA:          1.809 m Patient Age:    76 years        BP:           138/84 mmHg Patient Gender: F               HR:           92 bpm. Exam Location:  [facility] Procedure: 2D Echo, Cardiac Doppler and Color Doppler Indications:    R06.02 SOB  History:        Patient has prior history of Echocardiogram examinations, most                 recent 05/02/2018. COPD, Arrythmias:Tachycardia,                  Signs/Symptoms:Chest Pain and Syncope; Risk                 Factors:Hypertension, Dyslipidemia and Non-Smoker. Anemia                 Interstitial lung disease.  Sonographer:    Pilar Jarvis RDMS, RVT, RDCS Referring Phys: [id number] [name]  Sonographer Comments: This was a poor exam due to several factors. Supine imaging was best but patient was in pain on her back. The patient was intolerant to all transducer pressure and had trouble executing a proper breath hold. Unknown acoustic interference possible from COPD or other lung condition IMPRESSIONS  1. Left ventricular ejection fraction, by estimation, is >55%. The left ventricle has normal function. Left ventricular endocardial border not optimally defined to evaluate regional wall motion. There is mild left ventricular hypertrophy. Left ventricular diastolic parameters are indeterminate.  2. Pulmonary artery pressure is at least upper normal to mildly elevated (PASP 30-35 mmHg plus central venous pressure). Right ventricular systolic function is normal. The right ventricular size is normal. Mildly increased right ventricular wall thickness.  3. The mitral valve is grossly normal. No evidence of mitral valve  regurgitation.  4. The aortic valve has an indeterminant number of cusps. There is moderate calcification of the aortic valve. There is moderate thickening of the aortic valve. Aortic valve regurgitation is not visualized. Mild to moderate aortic valve sclerosis/calcification is present, without any evidence of aortic stenosis.  5. Aortic dilatation noted. There is borderline dilatation of the ascending aorta, measuring 36 mm.  6. Mildly dilated pulmonary artery. FINDINGS  Left Ventricle: Left ventricular ejection fraction, by estimation, is >55%. The left ventricle has normal function. Left ventricular endocardial border not optimally defined to evaluate regional wall motion. The left ventricular internal cavity size was  normal in size. There is mild  left ventricular hypertrophy. Left ventricular diastolic parameters are indeterminate. Right Ventricle: Pulmonary artery pressure is at least upper normal to mildly elevated (PASP 30-35 mmHg plus central venous pressure). The right ventricular size is normal. Mildly increased right ventricular wall thickness. Right ventricular systolic function is normal. Left Atrium: Left atrial size was normal in size. Right Atrium: Right atrial size was not well visualized. Pericardium: There is no evidence of pericardial effusion. Mitral Valve: The mitral valve is grossly normal. No evidence of mitral valve regurgitation. Tricuspid Valve: The tricuspid valve is not well visualized. Tricuspid valve regurgitation is trivial. Aortic Valve: The non-coronary leaflet appears fixed. The aortic valve has an indeterminant number of cusps. There is moderate calcification of the aortic valve. There is moderate thickening of the aortic valve. Aortic valve regurgitation is not visualized. Mild to moderate aortic valve sclerosis/calcification is present, without any evidence of aortic stenosis. Aortic valve mean gradient measures 8.0 mmHg. Aortic valve peak gradient measures 12.7 mmHg. Aortic valve area, by VTI measures 2.11 cm. Pulmonic Valve: The pulmonic valve was normal in structure. Pulmonic valve regurgitation is not visualized. No evidence of pulmonic stenosis. Aorta: Aortic dilatation noted. There is borderline dilatation of the ascending aorta, measuring 36 mm. Pulmonary Artery: The pulmonary artery is mildly dilated. Venous: The inferior vena cava was not well visualized. IAS/Shunts: The interatrial septum was not well visualized.  LEFT VENTRICLE PLAX 2D LVIDd:         4.20 cm  Diastology LVIDs:         3.10 cm  LV e' medial:    5.66 cm/s LV PW:         1.10 cm  LV E/e' medial:  11.1 LV IVS:        1.10 cm  LV e' lateral:   11.60 cm/s LVOT diam:     2.10 cm  LV E/e' lateral: 5.4 LV SV:         65 LV SV Index:   36 LVOT Area:      3.46 cm  RIGHT VENTRICLE             IVC RV S prime:     11.60 cm/s  IVC diam: 2.10 cm LEFT ATRIUM           Index LA diam:      3.50 cm 1.94 cm/m LA Vol (A2C): 54.3 ml 30.02 ml/m  AORTIC VALVE AV Area (Vmax):    1.79 cm AV Area (Vmean):   1.74 cm AV Area (VTI):     2.11 cm AV Vmax:           178.50 cm/s AV Vmean:          133.500 cm/s AV VTI:            0.308 m AV Peak Grad:  12.7 mmHg AV Mean Grad:      8.0 mmHg LVOT Vmax:         92.00 cm/s LVOT Vmean:        67.100 cm/s LVOT VTI:          0.188 m LVOT/AV VTI ratio: 0.61  AORTA Ao Root diam: 2.80 cm Ao Asc diam:  3.60 cm MITRAL VALVE                TRICUSPID VALVE MV Area (PHT): 2.53 cm     TR Peak grad:   31.4 mmHg MV Decel Time: 300 msec     TR Vmax:        280.00 cm/s MV E velocity: 62.60 cm/s MV A velocity: 102.00 cm/s  SHUNTS MV E/A ratio:  0.61         Systemic VTI:  0.19 m                             Systemic Diam: 2.10 cm Nelva Bush MD Electronically signed by Nelva Bush MD Signature Date/Time: 02/15/2020/5:09:07 PM    Final     Labs:  CBC: Recent Labs    01/02/20 2137 01/31/20 1155 02/01/20 0947 02/03/20 2050  WBC 8.1 7.3 7.1 7.2  HGB 10.5* 10.7* 10.7* 10.3*  HCT 34.9* 35.8* 33.9* 34.1*  PLT 342 354 355 363    COAGS: Recent Labs    02/03/20 2050  INR 1.1    BMP: Recent Labs    01/02/20 2137 01/31/20 1155 02/01/20 0947 02/03/20 2050  NA 135 140 140 139  K 3.4* 2.9* 3.1* 3.1*  CL 99 102 104 101  CO2 '29 30 28 26  ' GLUCOSE 85 110* 124* 111*  BUN '11 15 15 14  ' CALCIUM 9.3 9.3 9.2 9.3  CREATININE 0.90 0.90 0.78 0.96  GFRNONAA >60 >60 >60 57*  GFRAA >60 >60 >60 >60    LIVER FUNCTION TESTS: Recent Labs    01/02/20 2137 01/31/20 1155 02/01/20 0947 02/03/20 2050  BILITOT 0.2* 0.4 0.4 0.4  AST 27 72* 37 25  ALT '13 24 20 16  ' ALKPHOS 97 129* 131* 103  PROT 8.0 7.6 7.8 7.6  ALBUMIN 2.7* 2.9* 3.0* 2.8*    Assessment and Plan:  79 y.o. female outpatient. 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 Patient was found to have a RLL nodule with right pleural thickening while being worked up for right RUQ and right flank pain.  PET from 8.31.21 Persistent right lower lobe lung mass, consistent with primary bronchogenic carcinoma . Team is requesting a RLL biopsy for further evaluation.   All labs are within acceptable parameters. Patient is on eliquis. Per daughter last dose given 2-3 days ago  . Allergies include tape, Sulfa, cephalexin, ampicillin, Toradol, tramadol. Patient has been NPO since  Risks and benefits of RLL biopsy  was discussed with the patient and patient's daughter including, but not limited to bleeding, infection, damage to adjacent structures or low yield requiring additional tests.  All of the questions were answered and there is agreement to proceed.  Consent signed and in chart.   Thank you for this interesting consult.  I greatly enjoyed meeting SELENNE COGGIN and look forward to participating in their care.  A copy of this report was sent to the requesting provider  on this date.  Electronically Signed: Jacqualine Mau, NP 02/21/2020, 8:26 AM   I spent a total of  40 Minutes   in face to face in clinical consultation, greater than 50% of which was counseling/coordinating care for RLL biopsy

## 2020-02-21 NOTE — Discharge Instructions (Signed)
Lung Biopsy, Care After This sheet gives you information about how to care for yourself after your procedure. Your health care provider may also give you more specific instructions depending on the type of biopsy you had. If you have problems or questions, contact your health care provider. What can I expect after the procedure? After the procedure, it is common to have:  A cough.  A sore throat.  Pain where a needle, bronchoscope, or incision was used to collect a biopsy sample (biopsy site). Follow these instructions at home: Medicines  Take over-the-counter and prescription medicines only as told by your health care provider.  Do not drink alcohol if your health care provider tells you not to drink.  Ask your health care provider if the medicine prescribed to you: ? Requires you to avoid driving or using heavy machinery. ? Can cause constipation. You may need to take these actions to prevent or treat constipation:  Drink enough fluid to keep your urine pale yellow.  Take over-the-counter or prescription medicines.  Eat foods that are high in fiber, such as beans, whole grains, and fresh fruits and vegetables.  Limit foods that are high in fat and processed sugars, such as fried or sweet foods.  Do not drive for 24 hours if you were given a sedative. Biopsy site care   Follow instructions from your health care provider about how to take care of your biopsy site. Make sure you: ? Wash your hands with soap and water before and after you change your bandage (dressing). If soap and water are not available, use hand sanitizer. ? Change your dressing as told by your health care provider. ? Leave stitches (sutures), skin glue, or adhesive strips in place. These skin closures may need to stay in place for 2 weeks or longer. If adhesive strip edges start to loosen and curl up, you may trim the loose edges. Do not remove adhesive strips completely unless your health care provider tells  you to do that.  Do not take baths, swim, or use a hot tub until your health care provider approves. Ask your health care provider if you may take showers. You may only be allowed to take sponge baths.  Check your biopsy site every day for signs of infection. Check for: ? Redness, swelling, or more pain. ? Fluid or blood. ? Warmth. ? Pus or a bad smell. General instructions  Return to your normal activities as told by your health care provider. Ask your health care provider what activities are safe for you.  It is up to you to get the results of your procedure. Ask your health care provider, or the department that is doing the procedure, when your results will be ready.  Keep all follow-up visits as told by your health care provider. This is important. Contact a health care provider if:  You have a fever.  You have redness, swelling, or more pain around your biopsy site.  You have fluid or blood coming from your biopsy site.  Your biopsy site feels warm to the touch.  You have pus or a bad smell coming from your biopsy site.  You have pain that does not get better with medicine. Get help right away if:  You cough up blood.  You have trouble breathing.  You have chest pain.  You lose consciousness. Summary  After the procedure, it is common to have a sore throat and a cough.  Return to your normal activities as told by your  health care provider. Ask your health care provider what activities are safe for you.  Take over-the-counter and prescription medicines only as told by your health care provider.  Report any unusual symptoms to your health care provider. This information is not intended to replace advice given to you by your health care provider. Make sure you discuss any questions you have with your health care provider. Document Revised: 06/17/2018 Document Reviewed: 06/11/2016 Elsevier Patient Education  [place].

## 2020-02-22 ENCOUNTER — Encounter: Payer: Self-pay | Admitting: Family Medicine

## 2020-02-23 ENCOUNTER — Other Ambulatory Visit: Payer: Self-pay | Admitting: Oncology

## 2020-02-23 ENCOUNTER — Other Ambulatory Visit: Payer: Self-pay | Admitting: Family Medicine

## 2020-02-23 DIAGNOSIS — M4802 Spinal stenosis, cervical region: Secondary | ICD-10-CM

## 2020-02-23 DIAGNOSIS — G8929 Other chronic pain: Secondary | ICD-10-CM

## 2020-02-23 DIAGNOSIS — M545 Low back pain, unspecified: Secondary | ICD-10-CM

## 2020-02-23 DIAGNOSIS — R2681 Unsteadiness on feet: Secondary | ICD-10-CM

## 2020-02-23 LAB — SURGICAL PATHOLOGY

## 2020-02-25 ENCOUNTER — Inpatient Hospital Stay: Payer: Medicare Other | Attending: Oncology | Admitting: Oncology

## 2020-02-25 ENCOUNTER — Other Ambulatory Visit: Payer: Self-pay | Admitting: Oncology

## 2020-02-25 ENCOUNTER — Other Ambulatory Visit: Payer: Self-pay | Admitting: Family Medicine

## 2020-02-25 ENCOUNTER — Other Ambulatory Visit: Payer: Self-pay

## 2020-02-25 ENCOUNTER — Telehealth: Payer: Self-pay

## 2020-02-25 ENCOUNTER — Encounter: Payer: Self-pay | Admitting: *Deleted

## 2020-02-25 ENCOUNTER — Encounter: Payer: Self-pay | Admitting: Oncology

## 2020-02-25 VITALS — BP 122/86 | HR 105 | Temp 96.2°F | Resp 18 | Wt 161.8 lb

## 2020-02-25 DIAGNOSIS — D649 Anemia, unspecified: Secondary | ICD-10-CM | POA: Diagnosis not present

## 2020-02-25 DIAGNOSIS — M792 Neuralgia and neuritis, unspecified: Secondary | ICD-10-CM

## 2020-02-25 DIAGNOSIS — Z86718 Personal history of other venous thrombosis and embolism: Secondary | ICD-10-CM | POA: Insufficient documentation

## 2020-02-25 DIAGNOSIS — Z7189 Other specified counseling: Secondary | ICD-10-CM | POA: Diagnosis not present

## 2020-02-25 DIAGNOSIS — G8929 Other chronic pain: Secondary | ICD-10-CM

## 2020-02-25 DIAGNOSIS — C3491 Malignant neoplasm of unspecified part of right bronchus or lung: Secondary | ICD-10-CM | POA: Insufficient documentation

## 2020-02-25 DIAGNOSIS — R918 Other nonspecific abnormal finding of lung field: Secondary | ICD-10-CM | POA: Insufficient documentation

## 2020-02-25 DIAGNOSIS — M7918 Myalgia, other site: Secondary | ICD-10-CM

## 2020-02-25 DIAGNOSIS — F419 Anxiety disorder, unspecified: Secondary | ICD-10-CM

## 2020-02-25 DIAGNOSIS — D509 Iron deficiency anemia, unspecified: Secondary | ICD-10-CM | POA: Insufficient documentation

## 2020-02-25 DIAGNOSIS — Z87891 Personal history of nicotine dependence: Secondary | ICD-10-CM | POA: Insufficient documentation

## 2020-02-25 DIAGNOSIS — Z86711 Personal history of pulmonary embolism: Secondary | ICD-10-CM | POA: Diagnosis not present

## 2020-02-25 MED ORDER — LIDOCAINE-PRILOCAINE 2.5-2.5 % EX CREA: TOPICAL_CREAM | CUTANEOUS | 3 refills | Status: DC

## 2020-02-25 MED ORDER — PROCHLORPERAZINE MALEATE 10 MG PO TABS: 10.0000 mg | ORAL_TABLET | Freq: Four times a day (QID) | ORAL | 1 refills | Status: AC | PRN

## 2020-02-25 NOTE — Telephone Encounter (Signed)
[employer] order for case [id number] faxed to pathology

## 2020-02-25 NOTE — Progress Notes (Signed)
  Oncology Nurse Navigator Documentation  Navigator Location: [facility] (02/25/20 1500)   )Navigator Encounter Type: Follow-up Appt (02/25/20 1500)     Confirmed Diagnosis Date: 02/23/20 (02/25/20 1500)               Patient Visit Type: MedOnc (02/25/20 1500) Treatment Phase: Pre-Tx/Tx Discussion (02/25/20 1500) Barriers/Navigation Needs: Coordination of Care;Education (02/25/20 1500) Education: Newly Diagnosed Cancer Education;Understanding Cancer/ Treatment Options (02/25/20 1500) Interventions: Coordination of Care;Referrals (02/25/20 1500) Referrals: Nutrition/dietician;Radiation Oncology (02/25/20 1500) Coordination of Care: Appts;Chemo;Radiology (02/25/20 1500)        Acuity: Level 2-Minimal Needs (1-2 Barriers Identified) (02/25/20 1500)    met with patient and her daughter during follow up visit with Dr. Tasia Catchings to discuss pathology results and treatment options. All questions answered during visit. Pt given resources regarding diagnosis and supportive services available. Reviewed upcoming appts. Contact info given and instructed to call with any further questions or needs. Pt and her daughter verbalized understanding.     Time Spent with Patient: 60 (02/25/20 1500)

## 2020-02-25 NOTE — Progress Notes (Signed)
Hematology/Oncology follow up note [name] Telephone:([phone]) [phone] Fax:(336) [phone]   Patient Care Team: [name], FNP as PCP - General (Nurse Practitioner) Kate Sable, MD as PCP - Cardiology (Cardiology) [name], FNP (Nurse Practitioner) Telford Nab, RN as Oncology Nurse Navigator  REFERRING PROVIDER: [name], FNP  CHIEF COMPLAINTS/REASON FOR VISIT:  Follow-up for history of thrombosis, anticoagulation, and lung mass.  HISTORY OF PRESENTING ILLNESS:  [name] is a  79 y.o.  female with PMH listed below who was referred to me for evaluation of pulmonary embolism.  PE was diagnosed 04/30/2018, CT chest angiogram showed pulmonary embolism with right heart strain..  05/02/2018 US duplex lower extremities showed  Right: age indeterminate deep vein thrombosis involving the right posterior tibial vein, and right peroneal vein.  Left: acute deep vein thrombosis involving the left posterior tibial vein, and left peroneal vein.  She is on Eliquis 48m BID currently.  PE and DVT events occurred shortly after her hospitalization due to pneumonia.  Patient has chronic ambulation difficulty after her right hip arthroplasty  in May 2019. She subsequently had several complications immediately postoperative of fecal impaction and ileus.  This was done after an intertrochanteric hip fracture on the right side.She has had multiple ER visits over the past several months.  Also complains chronic lower extremities "soreness" for several month.     # 01/03/2020 patient had a CT chest angiogram done for evaluation of right upper quadrant and flank pain Study showed no acute PE.  Chronic pulmonary fibrosis with a new 3 cm rounded opacity in the superior segment of right lower lobe.  Superimposed new right lung pleural thickening. 01/25/2020, PET scan showed right lower lobe mass with SUV max of 18.5.  Hypermetabolic adenopathy within the  azygos esophageal recess, SUV of 16.2.  A low right paratracheal node with equivocal SUV activity. She presented emergency room on 01/31/2020 due to flank pain.  Had another CT chest angiogram done which showed no pulmonary embolism.   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  There was no metabolic activity on recent PET scan, favoring ampullary stenosis.  INTERVAL HISTORY Nicole FOLDENis a 79y.o. female who has above history reviewed by me today presents for follow up visit for management of pulmonary embolism, chronic lower extremity DVT.  No mass Problems and complaints are listed below: Patient was accompanied by her daughter. She has chronic history of anxiety and is on Xanax, Cymbalta.  Anxiety has worsened since she was notified that abnormal image findings. 02/21/2020 S/p CT guided biopsy of  RLL mass.  Pathology showed  NSCLC, favor squamous cell carcinoma.   Review of Systems  Constitutional: Positive for fatigue. Negative for appetite change, chills and fever.  HENT:   Negative for hearing loss and voice change.   Eyes: Negative for eye problems.  Respiratory: Positive for shortness of breath. Negative for chest tightness and cough.   Cardiovascular: Negative for chest pain.  Gastrointestinal: Negative for abdominal distention, abdominal pain and blood in stool.  Endocrine: Negative for hot flashes.  Genitourinary: Negative for difficulty urinating and frequency.        Right hip pain  Musculoskeletal: Positive for arthralgias.  Skin: Negative for itching and rash.  Neurological: Negative for extremity weakness.  Hematological: Negative for  adenopathy.  Psychiatric/Behavioral: Negative for confusion.    MEDICAL HISTORY:  Past Medical History:  Diagnosis Date  .  Acute postoperative pain 02/03/2018  . Anemia   . B12 deficiency   . Bacteremia 10/07/2014  . Bladder incontinence   . Blind left eye   . Cancer ([place])    skin cancer   . Cataract   . Cervical spine fracture ([place])   . Chest pain 07/29/2013  . Compression fracture    C7,  upper T spine -compression fx  . COPD (chronic obstructive pulmonary disease) ([place])   . DDD (degenerative disc disease), lumbar   . Depression    major  . Diffuse myofascial pain syndrome 03/24/2015  . Dyspnea    at times- when activty  . Fever 10/05/2014  . GERD (gastroesophageal reflux disease)   . Hiatal hernia   . Hyperlipidemia   . Hypertension   . 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 Macular degeneration   . On home oxygen therapy    "3L; all the time" (04/30/2018)  . Osteoarthritis   . Osteoporosis   . Pneumonia    years ago  . Pulmonary embolism ([place]) 04/30/2018  . Reactive airway disease   . Rupture of bowel ([place])   . Sepsis ([place]) 10/08/2014  . Stroke [facility])    "mini stroke"- balance and memory issue  . Stroke ([place])   . Wrist pain, acute 09/10/2012    SURGICAL HISTORY: Past Surgical History:  Procedure Laterality Date  . ABDOMINAL HYSTERECTOMY    . ABDOMINAL SURGERY    . APPENDECTOMY    . BLADDER SURGERY    . CATARACT EXTRACTION W/ INTRAOCULAR LENS  IMPLANT, BILATERAL Bilateral   . CHOLECYSTECTOMY    . COLON SURGERY    . COLOSTOMY REVERSAL    . ESOPHAGOGASTRODUODENOSCOPY N/A 10/30/2018   Procedure: ESOPHAGOGASTRODUODENOSCOPY (EGD);  Surgeon: Lin Landsman, MD;  Location: [facility] ENDOSCOPY;  Service: Gastroenterology;  Laterality: N/A;  . INTRAMEDULLARY (IM) NAIL INTERTROCHANTERIC Right 09/13/2016   Procedure: INTRAMEDULLARY (IM) NAIL INTERTROCHANTRIC RIGHT;  Surgeon: Leandrew Koyanagi, MD;  Location: [place];  Service: Orthopedics;  Laterality: Right;  . JOINT REPLACEMENT Bilateral   . JOINT REPLACEMENT    . KNEE SURGERY    . OSTOMY    . RECTOCELE REPAIR    . TOE SURGERY Right     3rd  . TOTAL HIP ARTHROPLASTY Right 09/30/2017   Procedure: RIGHT HIP IM NAIL REMOVAL WITH CONVERSION TO TOTAL HIP ARTHOPLASTY, anterior approach;  Surgeon: Renette Butters, MD;  Location: [place];  Service: Orthopedics;  Laterality: Right;    SOCIAL HISTORY: Social History   Socioeconomic History  . Marital status: Widowed    Spouse name: Not on file  . Number of children: 3  . Years of education: middle sch  . Highest education level: Not on file  Occupational History  . Occupation: retired    Comment: n/a  Tobacco Use  . Smoking status: Former Smoker    Packs/day: 1.00    Years: 30.00    Pack years: 30.00    Types: Cigarettes    Quit date: 1990    Years since quitting: 31.7  . Smokeless tobacco: Never Used  Vaping Use  . Vaping Use: Former  Substance and Sexual Activity  . Alcohol use: Not Currently  . Drug use: Not Currently  . Sexual activity: Not Currently  Other Topics Concern  . Not on file  Social History Narrative   ** Merged History Encounter **       **  Merged History Encounter **       Patient lives at home with daughter. Caffeine Use: 4 cups daily   Social Determinants of Health   Financial Resource Strain: Low Risk   . Difficulty of Paying Living Expenses: Not hard at all  Food Insecurity: No Food Insecurity  . Worried About Charity fundraiser in the Last Year: Never true  . Ran Out of Food in the Last Year: Never true  Transportation Needs: No Transportation Needs  . Lack of Transportation (Medical): No  . Lack of Transportation (Non-Medical): No  Physical Activity: Inactive  . Days of Exercise per Week: 0 days  . Minutes of Exercise per Session: 0 min  Stress: Stress Concern Present  . Feeling of Stress : Very much  Social Connections:   . Frequency of Communication with Friends and Family: Not on file  . Frequency of Social Gatherings with Friends and Family: Not on file  . Attends Religious Services: Not on file  . Active Member of Clubs  or Organizations: Not on file  . Attends Archivist Meetings: Not on file  . Marital Status: Not on file  Intimate Partner Violence: Not At Risk  . Fear of Current or Ex-Partner: No  . Emotionally Abused: No  . Physically Abused: No  . Sexually Abused: No    FAMILY HISTORY: Family History  Problem Relation Age of Onset  . Heart failure Mother   . Heart attack Father   . Lung cancer Sister   . Deep vein thrombosis Sister   . Pulmonary embolism Son   . Deep vein thrombosis Son   . Cervical cancer Daughter     ALLERGIES:  is allergic to ampicillin, ampicillin-sulbactam sodium, ambien [zolpidem tartrate], cyclobenzaprine, tape, toradol [ketorolac tromethamine], sulfamethoxazole-trimethoprim, zolpidem, cephalexin, and tramadol.  MEDICATIONS:  Current Outpatient Medications  Medication Sig Dispense Refill  . acetaminophen (TYLENOL) 500 MG tablet Take 2 tablets (1,000 mg total) by mouth in the morning, at noon, and at bedtime.    Marland Kitchen albuterol (ACCUNEB) 1.25 MG/3ML nebulizer solution Take 3 mLs (1.25 mg total) by nebulization every 6 (six) hours as needed for wheezing. 75 mL 1  . albuterol (VENTOLIN HFA) 108 (90 Base) MCG/ACT inhaler Inhale 2 puffs into the lungs every 6 (six) hours as needed for wheezing or shortness of breath. 1 Inhaler 2  . alendronate (FOSAMAX) 70 MG tablet TAKE 1 TABLET BY MOUTH ONCE A WEEK 12 tablet 1  . ALPRAZolam (XANAX) 1 MG tablet Take 0.5-1 tablets (0.5-1 mg total) by mouth 2 (two) times daily as needed for anxiety. 60 tablet 0  . amLODipine (NORVASC) 5 MG tablet TAKE 1 TABLET BY MOUTH EVERY DAY 90 tablet 1  . diclofenac Sodium (VOLTAREN) 1 % GEL Apply 4 g topically in the morning, at noon, and at bedtime. Around or in place of lidoderm patch 100 g 0  . docusate sodium (COLACE) 100 MG capsule Take 200 mg by mouth 2 (two) times daily as needed for moderate constipation.     . DULoxetine (CYMBALTA) 20 MG capsule TAKE 2 CAPSULES BY MOUTH EVERY DAY 180  capsule 0  . ELIQUIS 2.5 MG TABS tablet TAKE 1 TABLET BY MOUTH TWICE A DAY 60 tablet 3  . esomeprazole (NEXIUM) 40 MG capsule TAKE 1 CAPSULE BY MOUTH EVERY DAY 90 capsule 1  . fentaNYL (DURAGESIC) 25 MCG/HR Place 1 patch onto the skin every 3 (three) days.    Marland Kitchen FLOVENT HFA 220 MCG/ACT inhaler TAKE 1  PUFF BY MOUTH TWICE A DAY 24 Inhaler 5  . fluticasone (FLONASE) 50 MCG/ACT nasal spray SPRAY 2 SPRAYS INTO EACH NOSTRIL EVERY DAY 48 mL 1  . LINZESS 290 MCG CAPS capsule TAKE 1 CAPSULE BY MOUTH EVERY DAY 90 capsule 1  . lovastatin (MEVACOR) 10 MG tablet TAKE 1 TABLET BY MOUTH EVERY DAY 90 tablet 1  . mirtazapine (REMERON) 7.5 MG tablet TAKE 1 TABLET (7.5 MG TOTAL) BY MOUTH AT BEDTIME. 90 tablet 1  . ondansetron (ZOFRAN ODT) 4 MG disintegrating tablet Take 1 tablet (4 mg total) by mouth every 8 (eight) hours as needed for nausea or vomiting. 30 tablet 0  . orphenadrine (NORFLEX) 100 MG tablet 100 mg as needed.    . Oxycodone HCl 10 MG TABS Take 10 mg by mouth 3 (three) times daily.    . polyethylene glycol (MIRALAX / GLYCOLAX) packet Take 17 g by mouth daily as needed (for constipation.).     Marland Kitchen promethazine (PHENERGAN) 12.5 MG tablet Take 1 tablet (12.5 mg total) by mouth every 8 (eight) hours as needed for nausea or vomiting. 30 tablet 1  . NARCAN 4 MG/0.1ML LIQD nasal spray kit  (Patient not taking: Reported on 02/25/2020)    . pregabalin (LYRICA) 100 MG capsule TAKE 1 CAPSULE BY MOUTH TWICE A DAY (Patient not taking: Reported on 02/25/2020) 180 capsule 1  . traZODone (DESYREL) 50 MG tablet TAKE 0.5-1.5 TABLETS (25-75 MG TOTAL) BY MOUTH AT BEDTIME AS NEEDED FOR SLEEP. (Patient not taking: Reported on 02/25/2020) 135 tablet 1   No current facility-administered medications for this visit.     PHYSICAL EXAMINATION: ECOG PERFORMANCE STATUS: 2 - Symptomatic, <50% confined to bed Vitals:   02/25/20 1415  BP: 122/86  Pulse: (!) 105  Resp: 18  Temp: (!) 96.2 F (35.7 C)   Filed Weights   02/25/20  1415  Weight: 161 lb 12.8 oz (73.4 kg)    Physical Exam Constitutional:      General: She is not in acute distress.    Comments: Sits in wheelchair  Eyes:     General: No scleral icterus.    Pupils: Pupils are equal, round, and reactive to light.  Cardiovascular:     Rate and Rhythm: Normal rate and regular rhythm.     Heart sounds: Normal heart sounds.  Pulmonary:     Effort: Pulmonary effort is normal. No respiratory distress.     Breath sounds: No wheezing.     Comments: Bibasilar crackles Abdominal:     General: Bowel sounds are normal. There is no distension.     Palpations: Abdomen is soft. There is no mass.     Tenderness: There is no abdominal tenderness.  Musculoskeletal:        General: Swelling present. No deformity. Normal range of motion.     Cervical back: Normal range of motion and neck supple.  Skin:    General: Skin is warm and dry.     Findings: No erythema or rash.  Neurological:     Mental Status: She is alert and oriented to person, place, and time.     Cranial Nerves: No cranial nerve deficit.     Coordination: Coordination normal.      LABORATORY DATA:  I have reviewed the data as listed Lab Results  Component Value Date   WBC 7.2 02/03/2020   HGB 10.3 (L) 02/03/2020   HCT 34.1 (L) 02/03/2020   MCV 83.2 02/03/2020   PLT 363 02/03/2020   Recent  Labs    01/31/20 1155 02/01/20 0947 02/03/20 2050  NA 140 140 139  K 2.9* 3.1* 3.1*  CL 102 104 101  CO2 '30 28 26  ' GLUCOSE 110* 124* 111*  BUN '15 15 14  ' CREATININE 0.90 0.78 0.96  CALCIUM 9.3 9.2 9.3  GFRNONAA >60 >60 57*  GFRAA >60 >60 >60  PROT 7.6 7.8 7.6  ALBUMIN 2.9* 3.0* 2.8*  AST 72* 37 25  ALT '24 20 16  ' ALKPHOS 129* 131* 103  BILITOT 0.4 0.4 0.4  BILIDIR <0.1  --  <0.1  IBILI NOT CALCULATED  --  NOT CALCULATED   Iron/TIBC/Ferritin/ %Sat    Component Value Date/Time   IRON 49 02/01/2020 0947   TIBC 326 02/01/2020 0947   FERRITIN 11 02/01/2020 0947   IRONPCTSAT 15  02/01/2020 0947      RADIOGRAPHIC STUDIES: I have personally reviewed the radiological images as listed and agreed with the findings in the report. DG Chest 1 View  Result Date: 02/03/2020 CLINICAL DATA:  Right-sided chest pain. Recent lung cancer diagnosis. EXAM: CHEST  1 VIEW COMPARISON:  Radiograph and CT 3 days ago 01/31/2020 FINDINGS: Stable heart size and mediastinal contours. Rounded density projecting over the medial right lung base corresponds to lung mass on CT. No acute airspace disease. No developing pleural effusion. No pneumothorax. Chronic interstitial coarsening. Stable osseous structures. IMPRESSION: 1. Stable radiographic appearance of the chest. 2. Rounded density projecting over the medial right lung base corresponds to lung mass on CT. Electronically Signed   By: Keith Rake M.D.   On: 02/03/2020 21:14   DG Chest 2 View  Result Date: 01/31/2020 CLINICAL DATA:  Right-sided chest pain. EXAM: CHEST - 2 VIEW COMPARISON:  Chest CT January 03, 2020 FINDINGS: Cardiomediastinal silhouette is normal. Mediastinal contours appear intact. Tortuosity of the aorta. Right lower lobe lung mass is not changed radiographically. Coarsening of the interstitium. Osseous structures are without acute abnormality. Soft tissues are grossly normal. IMPRESSION: 1. Right lower lobe lung mass is not changed radiographically. 2. Coarsening of the interstitium. Electronically Signed   By: Fidela Salisbury M.D.   On: 01/31/2020 12:28   DG Chest 2 View  Result Date: 01/02/2020 CLINICAL DATA:  Lower right chest pain. EXAM: CHEST - 2 VIEW COMPARISON:  July 15, 2019 FINDINGS: Mild, diffuse chronic appearing increased interstitial lung markings are noted. This is slightly more prominent within the bilateral lower lobes. There is no evidence of acute infiltrate, pleural effusion or pneumothorax. The cardiac silhouette is mildly enlarged. There is tortuosity of the descending thoracic aorta. The visualized  skeletal structures are unremarkable. IMPRESSION: Chronic appearing increased interstitial lung markings without evidence of acute or active cardiopulmonary disease. Electronically Signed   By: Virgina Norfolk M.D.   On: 01/02/2020 22:18   DG Ribs Unilateral W/Chest Right  Result Date: 01/17/2020 CLINICAL DATA:  Right rib pain EXAM: RIGHT RIBS AND CHEST - 3+ VIEW COMPARISON:  01/02/2020 FINDINGS: Heart is normal size. Calcified granuloma in the left upper lobe. Diffuse interstitial prominence within the lungs, stable. No effusions or pneumothorax. No acute bony abnormality. No visible rib fracture. IMPRESSION: No visible displaced rib fracture. Diffuse interstitial prominence within the lungs likely reflects chronic interstitial lung disease. Electronically Signed   By: Rolm Baptise M.D.   On: 01/17/2020 22:47   CT Angio Chest PE W and/or Wo Contrast  Result Date: 01/31/2020 CLINICAL DATA:  Lung cancer, chest pain EXAM: CT ANGIOGRAPHY CHEST WITH CONTRAST TECHNIQUE: Multidetector CT imaging of  the chest was performed using the standard protocol during bolus administration of intravenous contrast. Multiplanar CT image reconstructions and MIPs were obtained to evaluate the vascular anatomy. CONTRAST:  17m OMNIPAQUE IOHEXOL 350 MG/ML SOLN COMPARISON:  01/03/2020 FINDINGS: Cardiovascular: Excellent opacification of the pulmonary arterial tree. No intraluminal filling defect identified to suggest acute pulmonary embolism. The central pulmonary arteries are mildly enlarged in keeping with changes of pulmonary arterial hypertension, stable since prior examination. Cardiac size is within normal limits. No pericardial effusion. The thoracic aorta is of normal caliber with mild atherosclerotic calcification noted within its descending segment. Mediastinum/Nodes: Pathologic as ago esophageal recess node is again identified, stable at 13 mm x 16 mm at axial image 40/3 shotty right paratracheal and precarinal adenopathy  is again identified without frank pathologic enlargement. Thyroid unremarkable. Esophagus unremarkable. Lungs/Pleura: The pleural based right posterior basal lower lobe pulmonary mass is again identified and is unchanged measuring 2.7 x 3.9 cm in greatest dimension at axial image # 47/5. Central necrosis with a small amount of intraluminal gas is again identified. The mass abuts the pleural margin, however, there is no definite extension into the chest wall itself. No pneumothorax or pleural effusion. Mild subpleural pulmonary fibrosis is again identified diffusely. No new focal pulmonary infiltrate. Central airways are widely patent. Upper Abdomen: Multiple simple cortical cysts are seen within the left kidney. There is surgical changes of cholecystectomy identified with moderate to severe extrahepatic biliary ductal dilation. 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 This may represent bulbous distension of the ampulla into the duodenal lumen as can be seen with ampullary stenosis or a primary neoplasm of the ampulla. The lack of significant metabolic activity on prior PET CT examination favors the former. Musculoskeletal: No acute bone abnormality. Review of the MIP images confirms the above findings. IMPRESSION: 1. No evidence of acute pulmonary embolism. 2. Stable size and appearance of right lower lobe pulmonary mass and associated pathologic azygoesophageal recess node. 3. Moderate to severe extrahepatic biliary ductal dilation with an ampullary mass better identified on the current examination. This may represent bulbous distension of the ampulla into the duodenal lumen as can be seen with ampullary stenosis or a primary neoplasm of the ampulla. The lack of significant metabolic activity on prior PET CT examination favors the former. Correlation with endoscopy would be helpful for further evaluation.  Aortic Atherosclerosis (ICD10-I70.0). Electronically Signed   By: AFidela SalisburyMD   On: 01/31/2020 16:30   CT Angio Chest PE W and/or Wo Contrast  Result Date: 01/03/2020 CLINICAL DATA:  79year old female with chest and abdominal pain. Right upper quadrant and flank pain since 0800 hours. EXAM: CT ANGIOGRAPHY CHEST WITH CONTRAST TECHNIQUE: Multidetector CT imaging of the chest was performed using the standard protocol during bolus administration of intravenous contrast. Multiplanar CT image reconstructions and MIPs were obtained to evaluate the vascular anatomy. CONTRAST:  1041mOMNIPAQUE IOHEXOL 350 MG/ML SOLN COMPARISON:  CTA chest 05/17/2018. CT Abdomen and Pelvis today reported separately. FINDINGS: Cardiovascular: Excellent contrast bolus timing in the pulmonary arterial tree. No focal filling defect identified in the pulmonary arteries to suggest acute pulmonary embolism. Calcified coronary artery atherosclerosis. Cardiac size remains within normal limits. No pericardial effusion. Negative visible aorta aside from atherosclerosis. Mediastinum/Nodes: Stable mediastinal lymph nodes since 2019, such as due to chronic or recurrent pulmonary inflammation. Lungs/Pleura: Lower lung volumes. Chronic pulmonary fibrosis. Major airways remain patent. New  right lung pleural thickening or trace pleural effusion with superimposed rounded roughly 3 cm superior segment lower lobe opacity with some internal gas or cavitation (series 4, image 60), new since 2019. But stable lung opacity elsewhere. Upper Abdomen: Negative visible liver, spleen (calcified granulomas), pancreas, adrenal glands, kidneys and bowel in the upper abdomen. Musculoskeletal: Multilevel upper thoracic and lower cervical compression fractures appear stable. No acute osseous abnormality identified. Review of the MIP images confirms the above findings. IMPRESSION: 1. No evidence of acute pulmonary embolus. 2. Chronic Pulmonary Fibrosis with a new 3 cm  rounded opacity in the superior segment right lower lobe since 2019. Superimposed new right lung pleural thickening or trace pleural fluid. This is indeterminate for pneumonia versus mass. Recommend one of the following: (a) repeat chest CT in 3 months, (b) follow-up PET-CT, or (c) tissue sampling. This recommendation adapted from the consensus statement: Guidelines for Management of Incidental Pulmonary Nodules Detected on CT Images: From the Fleischner Society 2017; Radiology 2017; 284:228-243. 3.  CT Abdomen, and Pelvis today are reported separately. 4. Calcified coronary artery atherosclerosis. Aortic Atherosclerosis (ICD10-I70.0). Electronically Signed   By: Genevie Ann M.D.   On: 01/03/2020 00:58   CT ABDOMEN PELVIS W CONTRAST  Result Date: 01/03/2020 CLINICAL DATA:  79 year old female with chest and abdominal pain. Right upper quadrant and flank pain since 0800 hours. EXAM: CT ABDOMEN AND PELVIS WITH CONTRAST TECHNIQUE: Multidetector CT imaging of the abdomen and pelvis was performed using the standard protocol following bolus administration of intravenous contrast. CONTRAST:  175m OMNIPAQUE IOHEXOL 350 MG/ML SOLN COMPARISON:  CTA chest today reported separately. CT Abdomen and Pelvis 06/18/2019 FINDINGS: Lower chest: On these images there is evidence of both pleural thickening with enhancement and intervening trace pleural fluid in the posterior right hemithorax (series 5, image 2), new since January. Some of the rounded right lower lobe superior segment opacity is also visible on these images, see chest CTA for additional details. Hepatobiliary: Chronically absent gallbladder. Intra and extrahepatic biliary ductal dilatation with CBD measuring up to 15 mm diameter, although this appears similar to the January CT. However, the CBD has enlarged since 2019 (up to 12 mm at that time. No filling defect identified within the duct. Liver enhancement remains within normal limits. Pancreas: Stable, negative.  No  pancreatic ductal enlargement. Spleen: Stable calcified splenic granulomas. Adrenals/Urinary Tract: Normal adrenal glands. Both kidneys appear stable since 2019 with symmetric renal enhancement and contrast excretion. Normal proximal ureters. 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 There is abundant gas within the urinary bladder on series 5, image 79. Mild bladder wall thickening appears stable. No perivesical stranding is evident. Stomach/Bowel: Redundant large bowel with mild retained stool throughout. Diverticulosis of the sigmoid colon with no active inflammation evident. Decompressed terminal ileum. Diminutive or absent appendix. No dilated small bowel. Decompressed stomach and duodenum. No free air, free fluid. Vascular/Lymphatic: Aortoiliac calcified atherosclerosis. Major arterial structures remain patent. Portal venous system is patent. No lymphadenopathy. Reproductive: Diminutive or absent as before. Other: Streak artifact in the pelvis, no pelvis free fluid. Musculoskeletal: Osteopenia. Chronic right hip arthroplasty. No acute osseous abnormality identified. IMPRESSION: 1. On these images there is evidence of pleural thickening and trace pleural fluid in the right lower lobe, new since January this year, and near the abnormal superior segment opacity described on Chest CTA today. This would seem to favor infection over tumor. 2. Chronic but increased  intra- and extrahepatic biliary ductal dilatation. No filling defect in the CBD. If there is hyperbilirubinemia then follow-up MRCP and/or ERCP may be valuable. 3. Gas within the urinary bladder, suspicious for UTI unless explained by recent catheterization. 4. Other chronic findings but no other acute or inflammatory process in the abdomen or pelvis. Electronically Signed   By: Genevie Ann M.D.   On: 01/03/2020 01:10   NM PET Image Initial (PI) Skull Base To Thigh  Result  Date: 01/25/2020 CLINICAL DATA:  Initial treatment strategy for chest CT demonstrating right lower lobe pneumonia versus mass. EXAM: NUCLEAR MEDICINE PET SKULL BASE TO THIGH TECHNIQUE: 8.9 mCi F-18 FDG was injected intravenously. Full-ring PET imaging was performed from the skull base to thigh after the radiotracer. CT data was obtained and used for attenuation correction and anatomic localization. Fasting blood glucose: 89 mg/dl COMPARISON:  CTA chest 01/03/2020.  Abdominopelvic CT 01/03/2020. FINDINGS: Mediastinal blood pool activity: SUV max 2.0 Liver activity: SUV max NA NECK: No areas of abnormal hypermetabolism. Incidental CT findings: No cervical adenopathy. Bilateral carotid atherosclerosis. CHEST: Pleural-based right lower lobe partially cavitary lung mass persists. 3.3 cm and a S.U.V. max of 18.5, including on 93/4. Hypermetabolic adenopathy within the azygoesophageal recess. 1.4 cm and a S.U.V. max of 16.2 on 85/4. A low right paratracheal node measures 7 mm and a S.U.V. max of 2.7 on 81/4, equivocal. Incidental CT findings: Similar trace right pleural fluid or thickening. Cardiomegaly. Aortic and coronary artery atherosclerosis. Interstitial lung disease, suboptimally evaluated. Concurrent centrilobular emphysema. ABDOMEN/PELVIS: No abdominopelvic parenchymal or nodal hypermetabolism. Incidental CT findings: Deferred to recent diagnostic CT. 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 Abdominal aortic atherosclerosis. No acute superimposed process. Pelvic degradation secondary to right hip arthroplasty. SKELETON: No suspicious marrow hypermetabolism. Superficial to the proximal right femur is subcutaneous soft tissue thickening and mild hypermetabolism, including at a S.U.V. max of 3.4 on 256/4. Likely postoperative. Incidental CT findings: Osteopenia. Upper thoracic vertebral body height loss was present on 01/03/2020. IMPRESSION: 1. Persistent right lower lobe lung mass, consistent with primary  bronchogenic carcinoma. 2. Hypermetabolic node within the azygoesophageal recess, consistent with nodal metastasis. A right paratracheal node is equivocal. 3. No evidence of hypermetabolic extrathoracic metastatic disease. Electronically Signed   By: Abigail Miyamoto M.D.   On: 01/25/2020 20:20   CT T-SPINE NO CHARGE  Result Date: 01/31/2020 CLINICAL DATA:  Lung cancer, right chest pain EXAM: CT THORACIC SPINE WITHOUT CONTRAST TECHNIQUE: Multidetector CT images of the thoracic were obtained using the standard protocol without intravenous contrast. COMPARISON:  None. Findings are correlated with PET CT examination of 01/25/2020 and concurrently performed CT arteriogram of the chest FINDINGS: Alignment: Normal thoracic kyphosis.  No listhesis. Vertebrae: Mild anterior wedge compression fracture T1 and T3 are identified with approximately 40% and 20% loss of height, respectively. No acute fracture of the thoracic spine. There is no lytic or blastic bone lesion identified involving the vertebral bodies of the thoracic spine. There is, however, subtle erosion of the a right eighth rib just lateral to the a costovertebral junction, best seen on axial image # 67/11 and 67/10 in keeping with chest wall invasion and direct involvement of the a thoracic cage by the primary mass within the posterior basal right lower lobe. Paraspinal and other soft tissues: Posterior basal right lower lobe pleural based central necrotic pulmonary mass again identified. Mild subpleural pulmonary fibrosis again noted. Pathologic as ago esophageal recess noted. Multiple simple cortical cysts are seen within the visualized  left kidney. Disc levels: Intervertebral disc height has been preserved though there are discogenic changes noted predominantly at T5-T11 in keeping with changes of mild to moderate degenerative disc disease. Review of the axial images demonstrates no significant canal stenosis. No significant neural foraminal narrowing.  IMPRESSION: 1. No lytic or blastic bone lesion identified involving the vertebral bodies of the thoracic spine. 2. Mild anterior wedge compression fractures of T1 and T3 are identified with approximately 40% and 20% loss of height, respectively. These appear chronic in nature. 3. No significant canal stenosis or neural foraminal narrowing is seen within the thoracic spine. 4. Posterior basal right lower lobe pleural based central necrotic pulmonary mass again identified. There is subtle erosion of the right eighth rib just lateral to the a costovertebral junction in keeping with chest wall invasion and direct involvement of the a thoracic cage by the primary mass within the posterior basal right lower lobe. Electronically Signed   By: Fidela Salisbury MD   On: 01/31/2020 16:41   CT BIOPSY  Result Date: 02/21/2020 CLINICAL DATA:  Persistent posterior right lower lobe lung mass demonstrating increased metabolic activity by PET scan. The patient presents for CT-guided biopsy of the lung mass. EXAM: CT GUIDED CORE BIOPSY OF RIGHT LOWER LOBE LUNG MASS ANESTHESIA/SEDATION: 2.0 mg IV Versed; 100 mcg IV Fentanyl Total Moderate Sedation Time:  26 minutes. The patient's level of consciousness and physiologic status were continuously monitored during the procedure by Radiology nursing. PROCEDURE: The procedure risks, benefits, and alternatives were explained to the patient. Questions regarding the procedure were encouraged and answered. The patient understands and consents to the procedure. A time-out was performed prior to initiating the procedure. CT was performed through the chest in a prone position. The right posterior chest wall was prepped with chlorhexidine in a sterile fashion, and a sterile drape was applied covering the operative field. A sterile gown and sterile gloves were used for the procedure. Local anesthesia was provided with 1% Lidocaine. Under CT guidance, a 17 gauge trocar needle was advanced to the  level of a posterior right lower lobe lung mass. After confirming needle tip position, 2 separate coaxial 18 gauge core biopsy samples were obtained and submitted in formalin. The BioSentry device was used in depositing a plug at the pleural entry site. Additional CT was performed after outer needle removal. COMPLICATIONS: None FINDINGS: Posterior subpleural right lower lobe lung mass again noted measuring approximately 3.0 x 3.9 cm in greatest transverse dimensions. Solid tissue was obtained. There was no evidence of regional hemorrhage or pneumothorax immediately following the procedure by CT. The patient will recover for at least 2 hours after the procedure. IMPRESSION: CT-guided core biopsy performed of a 3.9 cm right lower lobe subpleural mass. Electronically Signed   By: Aletta Edouard M.D.   On: 02/21/2020 12:46   ECHOCARDIOGRAM COMPLETE  Result Date: 02/15/2020    ECHOCARDIOGRAM REPORT   Patient Name:   AMAIRANI SHUEY Date of Exam: 02/15/2020 Medical Rec #:  [id number]       Height:       65.0 in Accession #:    [id number]      Weight:       162.0 lb Date of Birth:  May 28, 1940        BSA:          1.809 m Patient Age:    53 years        BP:           138/84  mmHg Patient Gender: F               HR:           92 bpm. Exam Location:  [facility] Procedure: 2D Echo, Cardiac Doppler and Color Doppler Indications:    R06.02 SOB  History:        Patient has prior history of Echocardiogram examinations, most                 recent 05/02/2018. COPD, Arrythmias:Tachycardia,                 Signs/Symptoms:Chest Pain and Syncope; Risk                 Factors:Hypertension, Dyslipidemia and Non-Smoker. Anemia                 Interstitial lung disease.  Sonographer:    Pilar Jarvis RDMS, RVT, RDCS Referring Phys: [id number] [name]  Sonographer Comments: This was a poor exam due to several factors. Supine imaging was best but patient was in pain on her back. The patient was intolerant to all transducer pressure and  had trouble executing a proper breath hold. Unknown acoustic interference possible from COPD or other lung condition IMPRESSIONS  1. Left ventricular ejection fraction, by estimation, is >55%. The left ventricle has normal function. Left ventricular endocardial border not optimally defined to evaluate regional wall motion. There is mild left ventricular hypertrophy. Left ventricular diastolic parameters are indeterminate.  2. Pulmonary artery pressure is at least upper normal to mildly elevated (PASP 30-35 mmHg plus central venous pressure). Right ventricular systolic function is normal. The right ventricular size is normal. Mildly increased right ventricular wall thickness.  3. The mitral valve is grossly normal. No evidence of mitral valve regurgitation.  4. The aortic valve has an indeterminant number of cusps. There is moderate calcification of the aortic valve. There is moderate thickening of the aortic valve. Aortic valve regurgitation is not visualized. Mild to moderate aortic valve sclerosis/calcification is present, without any evidence of aortic stenosis.  5. Aortic dilatation noted. There is borderline dilatation of the ascending aorta, measuring 36 mm.  6. Mildly dilated pulmonary artery. FINDINGS  Left Ventricle: Left ventricular ejection fraction, by estimation, is >55%. The left ventricle has normal function. Left ventricular endocardial border not optimally defined to evaluate regional wall motion. The left ventricular internal cavity size was  normal in size. There is mild left ventricular hypertrophy. Left ventricular diastolic parameters are indeterminate. Right Ventricle: Pulmonary artery pressure is at least upper normal to mildly elevated (PASP 30-35 mmHg plus central venous pressure). The right ventricular size is normal. Mildly increased right ventricular wall thickness. Right ventricular systolic function is normal. Left Atrium: Left atrial size was normal in size. Right Atrium: Right atrial  size was not well visualized. Pericardium: There is no evidence of pericardial effusion. Mitral Valve: The mitral valve is grossly normal. No evidence of mitral valve regurgitation. Tricuspid Valve: The tricuspid valve is not well visualized. Tricuspid valve regurgitation is trivial. Aortic Valve: The non-coronary leaflet appears fixed. The aortic valve has an indeterminant number of cusps. There is moderate calcification of the aortic valve. There is moderate thickening of the aortic valve. Aortic valve regurgitation is not visualized. Mild to moderate aortic valve sclerosis/calcification is present, without any evidence of aortic stenosis. Aortic valve mean gradient measures 8.0 mmHg. Aortic valve peak gradient measures 12.7 mmHg. Aortic valve area, by VTI measures 2.11 cm. Pulmonic Valve: The pulmonic  valve was normal in structure. Pulmonic valve regurgitation is not visualized. No evidence of pulmonic stenosis. Aorta: Aortic dilatation noted. There is borderline dilatation of the ascending aorta, measuring 36 mm. Pulmonary Artery: The pulmonary artery is mildly dilated. Venous: The inferior vena cava was not well visualized. IAS/Shunts: The interatrial septum was not well visualized.  LEFT VENTRICLE PLAX 2D LVIDd:         4.20 cm  Diastology LVIDs:         3.10 cm  LV e' medial:    5.66 cm/s LV PW:         1.10 cm  LV E/e' medial:  11.1 LV IVS:        1.10 cm  LV e' lateral:   11.60 cm/s LVOT diam:     2.10 cm  LV E/e' lateral: 5.4 LV SV:         65 LV SV Index:   36 LVOT Area:     3.46 cm  RIGHT VENTRICLE             IVC RV S prime:     11.60 cm/s  IVC diam: 2.10 cm LEFT ATRIUM           Index LA diam:      3.50 cm 1.94 cm/m LA Vol (A2C): 54.3 ml 30.02 ml/m  AORTIC VALVE AV Area (Vmax):    1.79 cm AV Area (Vmean):   1.74 cm AV Area (VTI):     2.11 cm AV Vmax:           178.50 cm/s AV Vmean:          133.500 cm/s AV VTI:            0.308 m AV Peak Grad:      12.7 mmHg AV Mean Grad:      8.0 mmHg LVOT  Vmax:         92.00 cm/s LVOT Vmean:        67.100 cm/s LVOT VTI:          0.188 m LVOT/AV VTI ratio: 0.61  AORTA Ao Root diam: 2.80 cm Ao Asc diam:  3.60 cm MITRAL VALVE                TRICUSPID VALVE MV Area (PHT): 2.53 cm     TR Peak grad:   31.4 mmHg MV Decel Time: 300 msec     TR Vmax:        280.00 cm/s MV E velocity: 62.60 cm/s MV A velocity: 102.00 cm/s  SHUNTS MV E/A ratio:  0.61         Systemic VTI:  0.19 m                             Systemic Diam: 2.10 cm Nelva Bush MD Electronically signed by Nelva Bush MD Signature Date/Time: 02/15/2020/5:09:07 PM    Final    DG Hip Unilat W OR W/O Pelvis 2-3 Views Right  Result Date: 01/17/2020 CLINICAL DATA:  Right hip pain EXAM: DG HIP (WITH OR WITHOUT PELVIS) 2-3V RIGHT COMPARISON:  09/07/2019 FINDINGS: Changes of right hip replacement. No hardware complicating feature. No fracture, subluxation or dislocation IMPRESSION: No acute bony abnormality. Electronically Signed   By: Rolm Baptise M.D.   On: 01/17/2020 22:55    ASSESSMENT & PLAN:  1. Squamous cell carcinoma of lung, right ([place])   2. Goals of care, counseling/discussion  3. History of pulmonary embolism   4. History of deep vein thrombosis of lower extremity   5. Normocytic anemia   6. Anxiety   Cancer Staging Squamous cell carcinoma of lung, right ([place]) Staging form: Lung, AJCC 8th Edition - Clinical: cT1, cN2, cM0 - Signed by Earlie Server, MD on 02/25/2020   # Squamous cell carcinoma of lung, Stage III Images were independently reviewed and discussed with patient. Recommend MRI brain to complete staging.  The diagnosis and care plan were discussed with patient in detail.  NCCN guidelines were reviewed and shared with patient.  The goal of treatment is with curative intent. Recommend concurrent chemotherapy and radiation followed by immunotherapy.  Chemotherapy education was provided.  We had discussed the composition of chemotherapy regimen, length of chemo cycle, duration of  treatment and the time to assess response to treatment.    I explained to the patient the risks and benefits of chemotherapy carboplatin AUC 2 and Taxol 76m /m2 weekly  including all but not limited to hair loss, mouth sore, nausea, vomiting, diarrhea, low blood counts, bleeding, neuropathy and risk of life threatening infection and even death, secondary malignancy etc.  . Patient voices understanding and willing to proceed chemotherapy. I also discussed with patient that if she does not tolerate chemo well, will omit chemo and just proceed with RT , followed up by immunotherapy.   # Chemotherapy education; Medi- port option was discussed and she is interested . Will refer to vascular surgery. Antiemetics- Compazine; EMLA cream sent to pharmacy # will obtain NGS.   # refer to Radonc.   Supportive care measures are necessary for patient well-being and will be provided as necessary.  # History of PE and DVT Continue Eliquis 2.5 mg twice daily for maintenance anticoagulation.  # Poor appetite/weight loss, refer to nutritionist.   # Chronic anemia, hemoglobin is 10.7, same as last visit. Monitor.  #Anxiety, anxiety seems to be worse since she knows the abnormal findings.  Continue Xanax and Cymbalta and Remeron..recommend her to continue follow up with primary care provider.    All questions were answered. The patient knows to call the clinic with any problems questions or concerns.  Return of visit: To be determined  ZEarlie Server MD, PhD Hematology Oncology [facility]10/05/2019

## 2020-02-25 NOTE — Progress Notes (Signed)
Pt here for biopsy results. No new concerns voiced.

## 2020-02-25 NOTE — Progress Notes (Signed)
START ON PATHWAY REGIMEN - Non-Small Cell Lung     Administer weekly:     Paclitaxel      Carboplatin   **Always confirm dose/schedule in your pharmacy ordering system**  Patient Characteristics: Preoperative or Nonsurgical Candidate (Clinical Staging), Stage III - Nonsurgical Candidate (Nonsquamous and Squamous), PS = 0, 1 Therapeutic Status: Preoperative or Nonsurgical Candidate (Clinical Staging) AJCC T Category: cT1 AJCC N Category: cN2 AJCC M Category: cM0 AJCC 8 Stage Grouping: Unknown ECOG Performance Status: 1 Intent of Therapy: Curative Intent, Discussed with Patient

## 2020-02-26 NOTE — Telephone Encounter (Signed)
Upcoming appt 03/15/2020  Last appt 01/05/2020. Last refill 01/17/2020

## 2020-02-27 IMAGING — CR DG HIP (WITH OR WITHOUT PELVIS) 2-3V*R*
3 series · 3 of 3 positions shown · non-contrast
Comparison: Radiographs April 17, 2018.

CLINICAL DATA: Right hip pain after fall.

EXAM:
DG HIP (WITH OR WITHOUT PELVIS) 2-3V RIGHT

[pelvis ap]
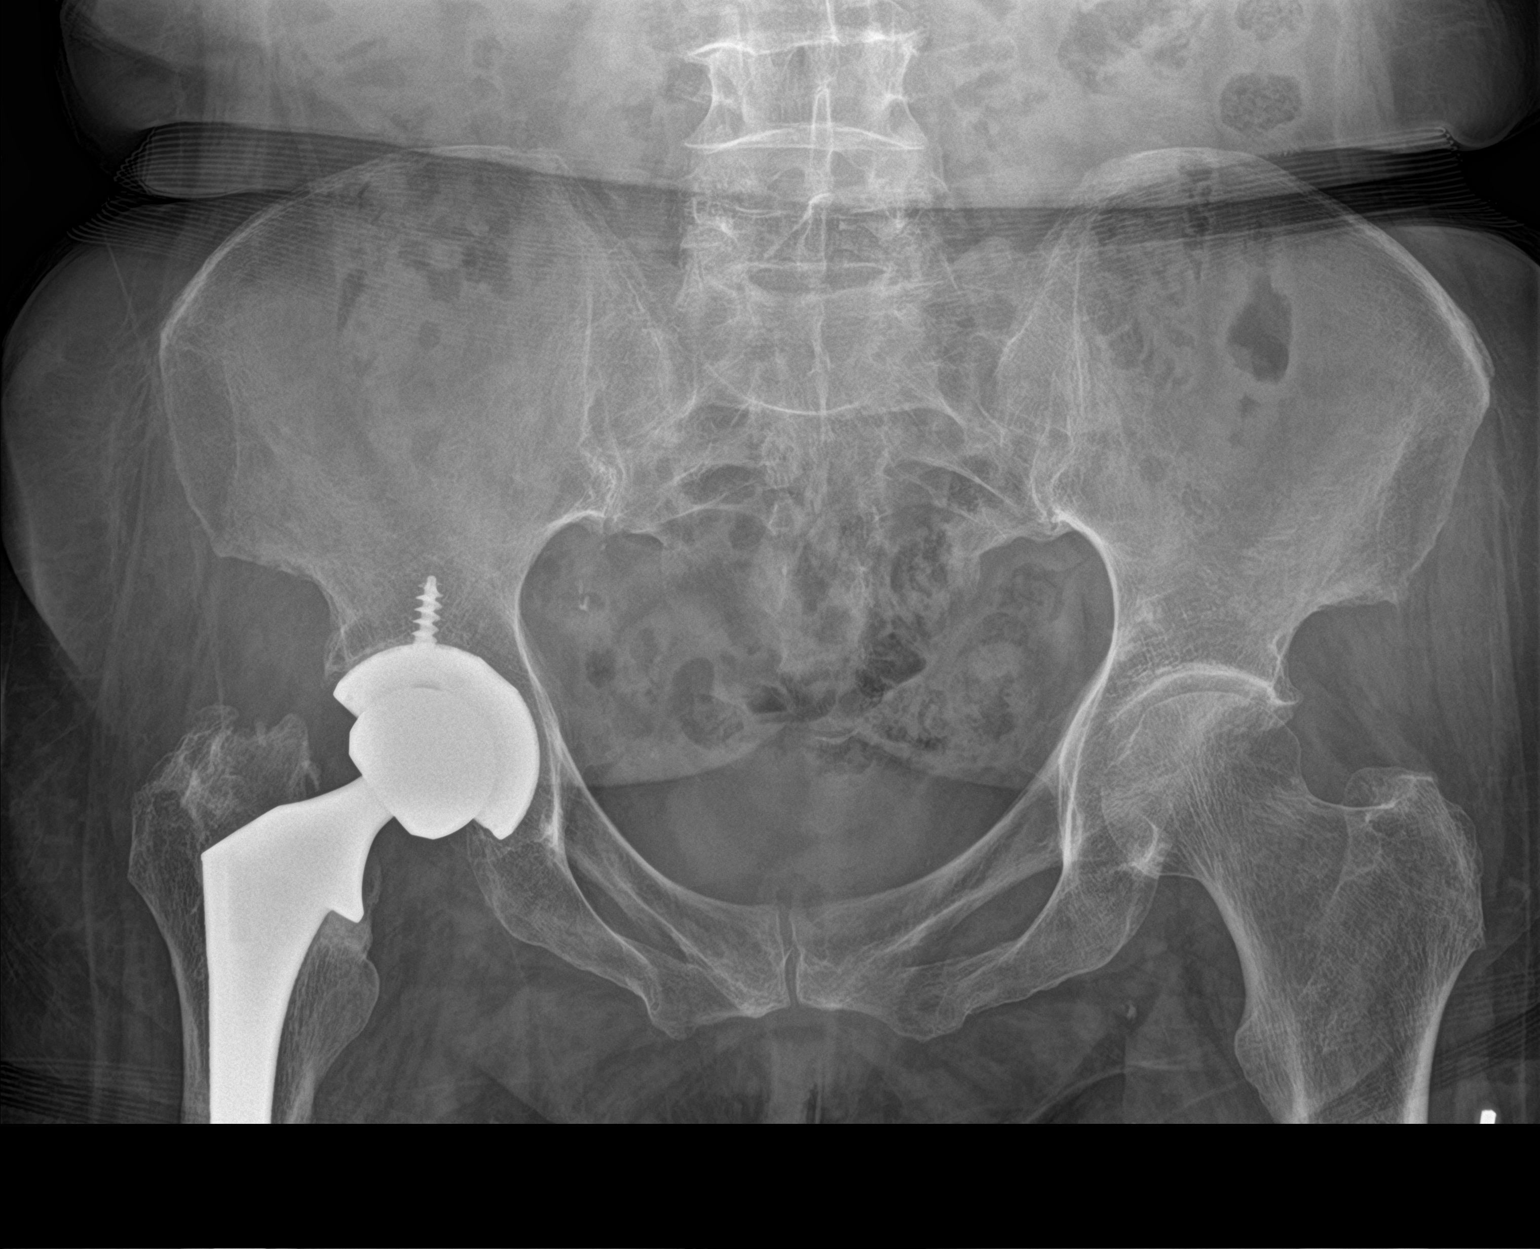

[hip ap]
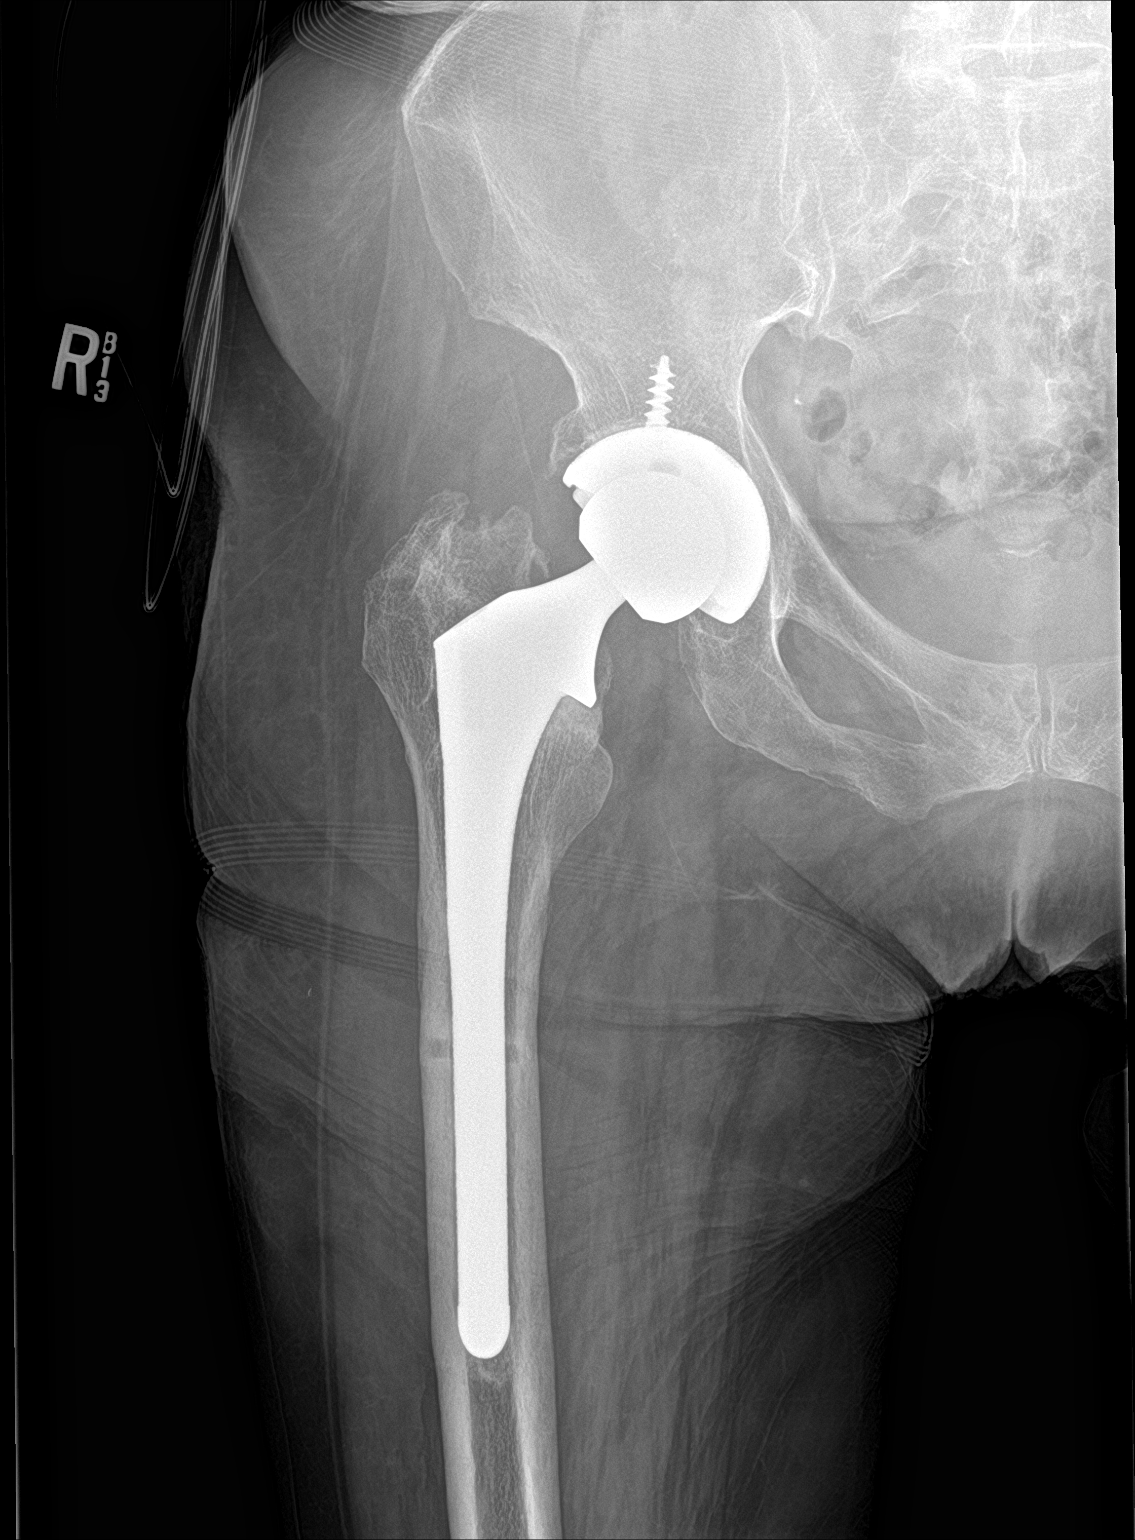

[hip lat]
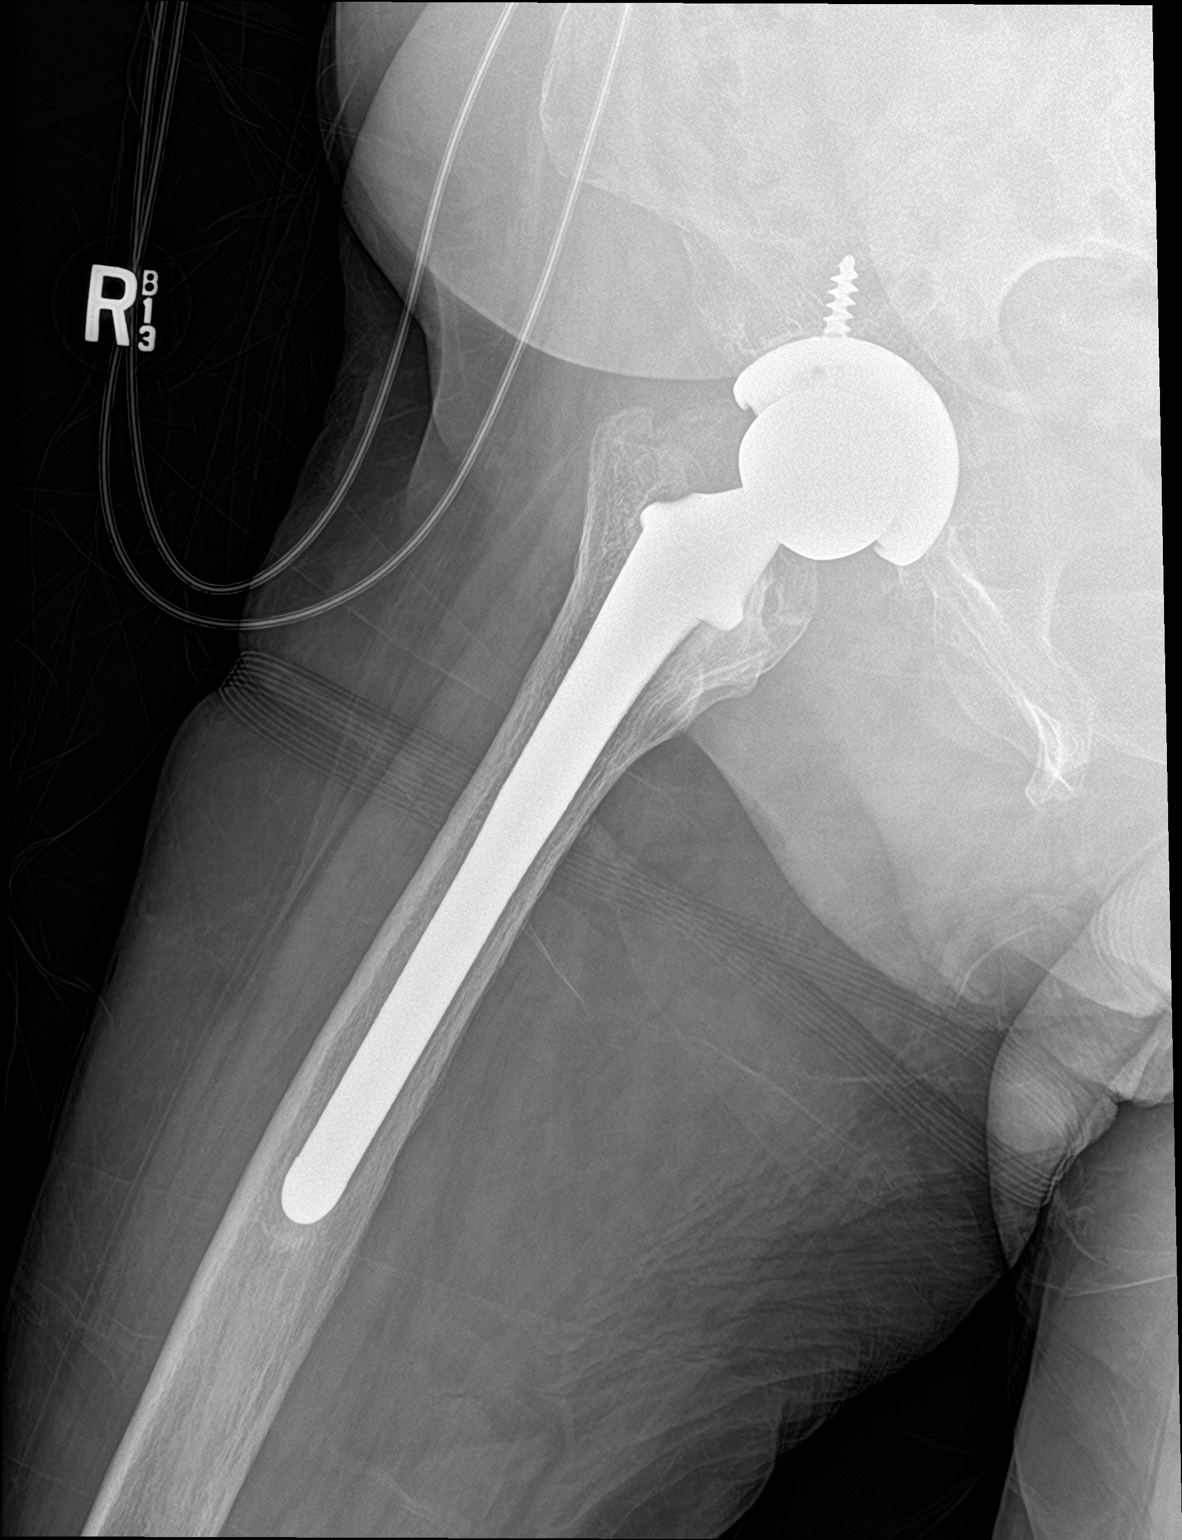

[3 of 3 positions shown; findings below may reference images not displayed]

FINDINGS: Status post right total hip arthroplasty. Left hip appears normal.
No fracture or dislocation is noted.
IMPRESSION: No acute abnormality seen in the right hip.

## 2020-02-27 IMAGING — CR DG KNEE COMPLETE 4+V*R*
4 series · 4 of 4 positions shown · non-contrast
Comparison: None.

CLINICAL DATA: Right knee pain after fall today.

EXAM:
RIGHT KNEE - COMPLETE 4+ VIEW

[knee ap]
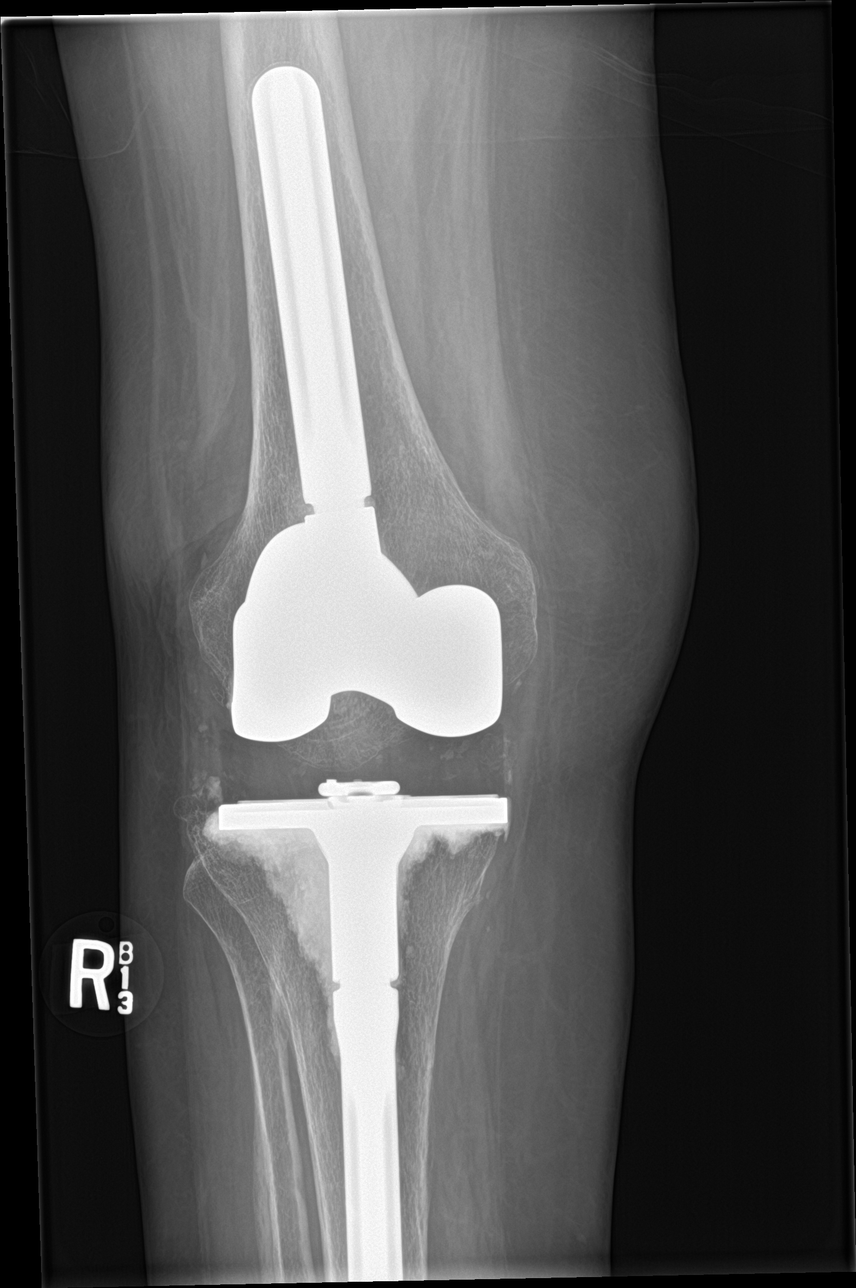

[knee obl (1 of 2)]
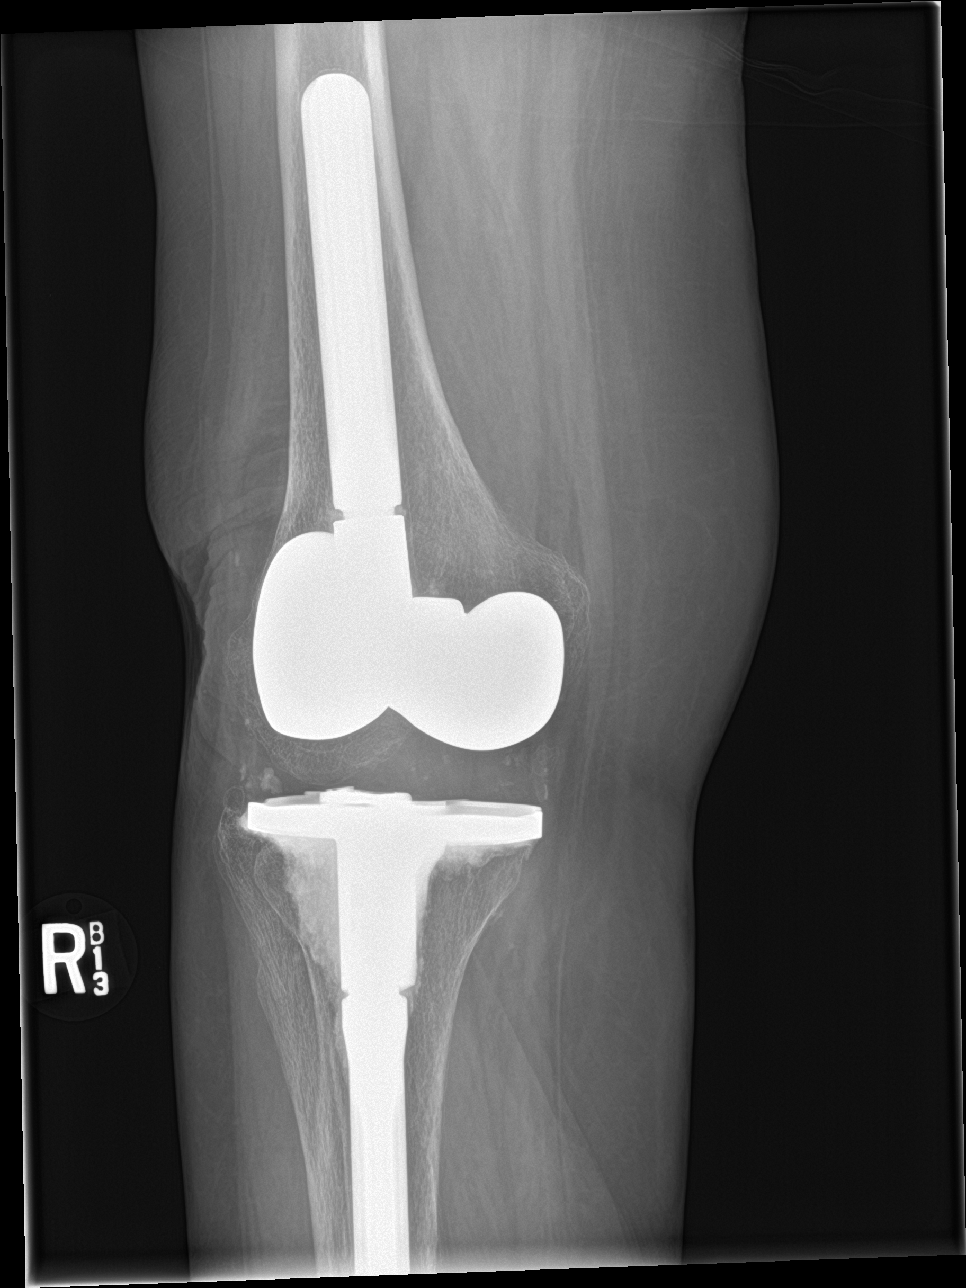

[knee obl (2 of 2)]
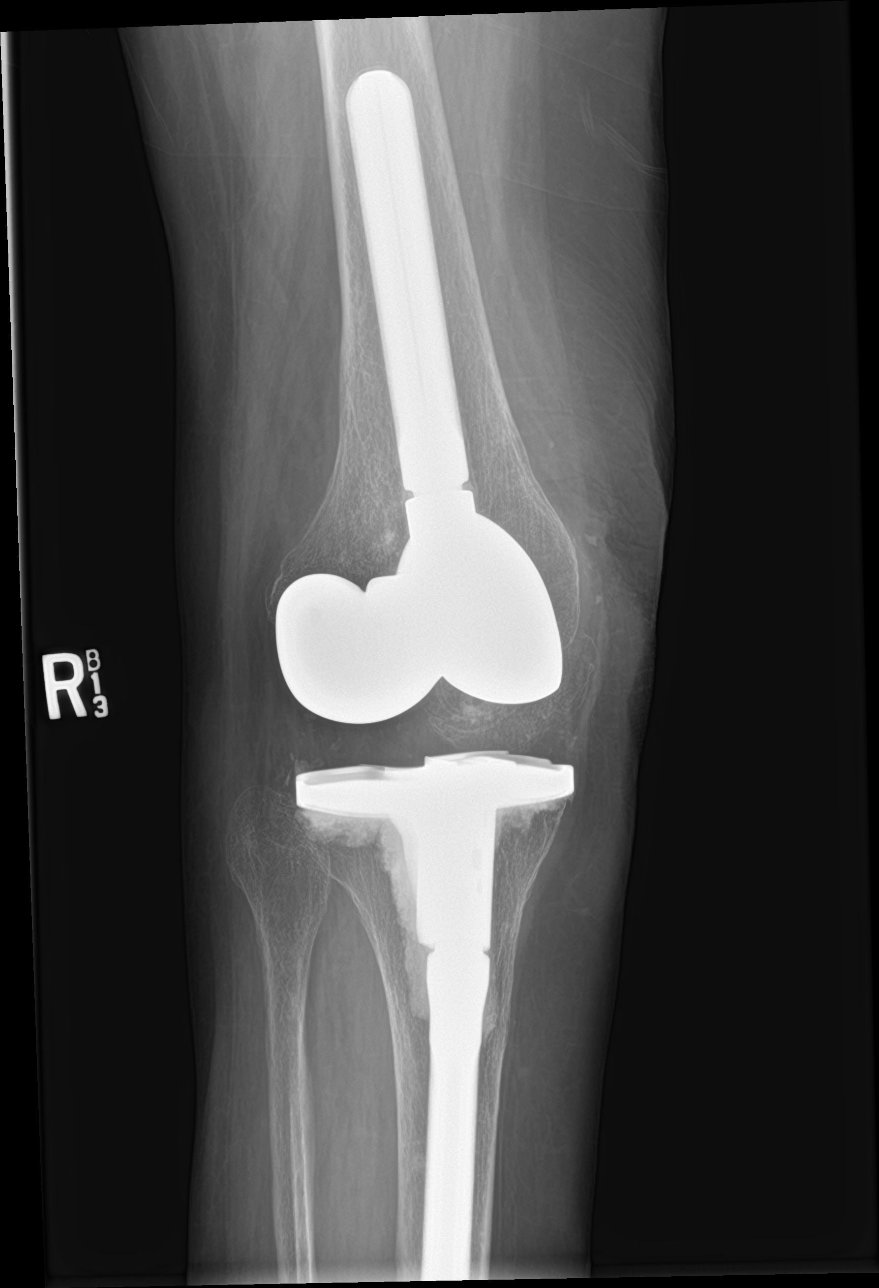

[knee lat]
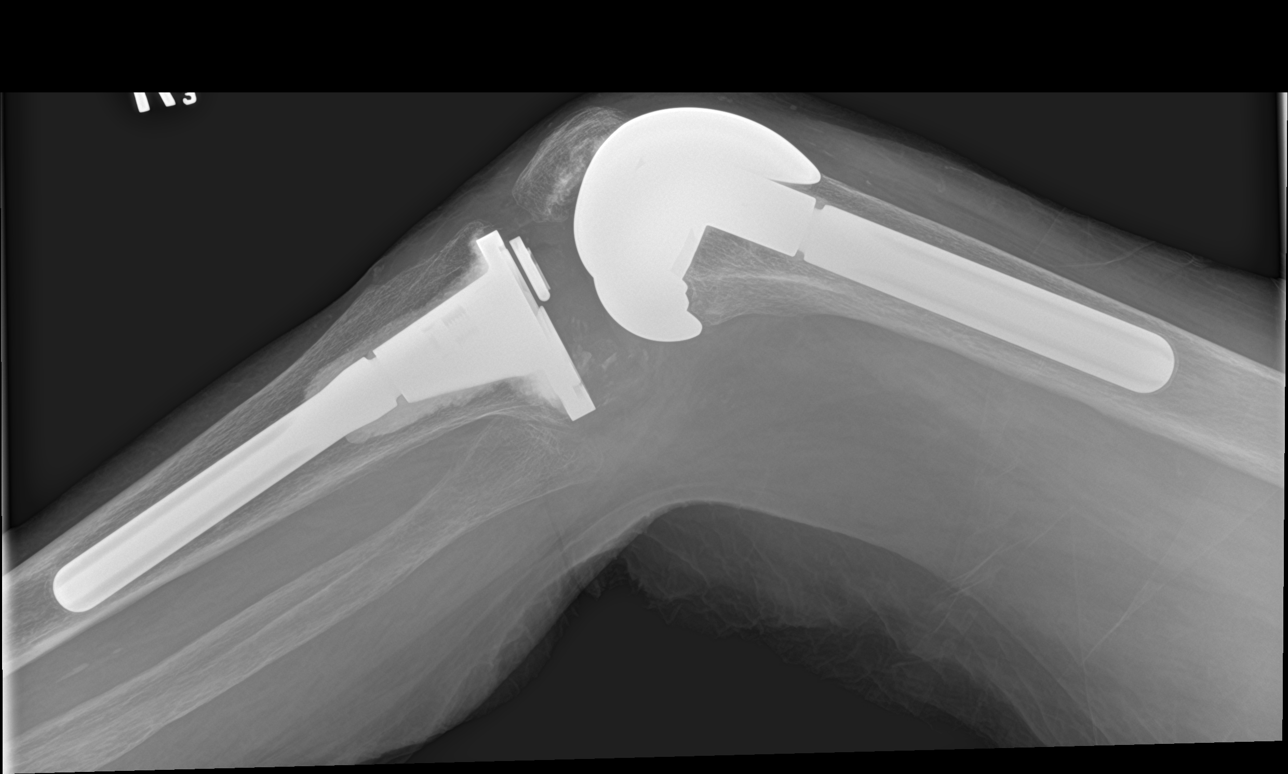

[4 of 4 positions shown; findings below may reference images not displayed]

FINDINGS: Status post right total knee arthroplasty. The femoral and tibial
components appear to be well situated. No acute fracture or
dislocation is noted. No joint effusion is noted.
IMPRESSION: Status post right total knee arthroplasty. No acute abnormality seen
in the right knee.

## 2020-02-28 ENCOUNTER — Encounter: Payer: Self-pay | Admitting: Family Medicine

## 2020-02-28 ENCOUNTER — Telehealth (INDEPENDENT_AMBULATORY_CARE_PROVIDER_SITE_OTHER): Payer: Self-pay

## 2020-02-28 ENCOUNTER — Inpatient Hospital Stay: Payer: Medicare Other

## 2020-02-28 NOTE — Telephone Encounter (Signed)
I attempted to contact the patient and a message was left for a return call. 

## 2020-02-29 ENCOUNTER — Encounter: Payer: Self-pay | Admitting: Family Medicine

## 2020-03-01 ENCOUNTER — Telehealth: Payer: Self-pay

## 2020-03-01 ENCOUNTER — Other Ambulatory Visit: Payer: Self-pay | Admitting: Oncology

## 2020-03-01 ENCOUNTER — Other Ambulatory Visit: Payer: Self-pay

## 2020-03-01 ENCOUNTER — Other Ambulatory Visit: Payer: Self-pay | Admitting: Family Medicine

## 2020-03-01 DIAGNOSIS — M792 Neuralgia and neuritis, unspecified: Secondary | ICD-10-CM

## 2020-03-01 DIAGNOSIS — C3491 Malignant neoplasm of unspecified part of right bronchus or lung: Secondary | ICD-10-CM

## 2020-03-01 DIAGNOSIS — M7918 Myalgia, other site: Secondary | ICD-10-CM

## 2020-03-01 DIAGNOSIS — G8929 Other chronic pain: Secondary | ICD-10-CM

## 2020-03-01 MED ORDER — PREGABALIN 100 MG PO CAPS: 100.0000 mg | ORAL_CAPSULE | Freq: Two times a day (BID) | ORAL | 0 refills | Status: AC

## 2020-03-01 MED ORDER — MONTELUKAST SODIUM 10 MG PO TABS: 10.0000 mg | ORAL_TABLET | Freq: Every day | ORAL | 0 refills | Status: DC

## 2020-03-01 NOTE — Telephone Encounter (Signed)
Refill did not go through to pharmacy.  Please resend.

## 2020-03-01 NOTE — Telephone Encounter (Signed)
Patient is not scheduled for further appts at this time.  Per Dr. Tasia Catchings patient will need lab md carbo taxol on 1st or 2nd day of XRT.  Message sent to Vicente Males to find out when patient will start radiation.

## 2020-03-01 NOTE — Telephone Encounter (Signed)
Spoke with [name] to let her know that [name] has been rescheduled for chemo class on 03/06/20.  Dr. Tasia Catchings needed her to have BMP drawn for chemo tx plan.  They have a difficult time getting transportation so will get the lab drawn when here for chemo class.

## 2020-03-01 NOTE — Telephone Encounter (Signed)
I attempted to contact the patient and it went straight to voicemail and a message was left for a return call.

## 2020-03-01 NOTE — Addendum Note (Signed)
Addended by: Carter Kitten on: 03/01/2020 02:15 PM   Modules accepted: Orders

## 2020-03-02 ENCOUNTER — Other Ambulatory Visit: Payer: Self-pay | Admitting: Family Medicine

## 2020-03-02 ENCOUNTER — Ambulatory Visit: Payer: Medicare Other | Admitting: Radiation Oncology

## 2020-03-02 ENCOUNTER — Encounter: Payer: Self-pay | Admitting: Family Medicine

## 2020-03-03 ENCOUNTER — Encounter: Payer: Self-pay | Admitting: Family Medicine

## 2020-03-04 ENCOUNTER — Ambulatory Visit
Admission: RE | Admit: 2020-03-04 | Discharge: 2020-03-04 | Disposition: A | Discharge status: Home / Self Care | Payer: Medicare Other | Source: Ambulatory Visit | Attending: Oncology | Admitting: Oncology

## 2020-03-04 ENCOUNTER — Other Ambulatory Visit: Payer: Self-pay

## 2020-03-04 DIAGNOSIS — C3490 Malignant neoplasm of unspecified part of unspecified bronchus or lung: Secondary | ICD-10-CM | POA: Diagnosis not present

## 2020-03-04 DIAGNOSIS — R918 Other nonspecific abnormal finding of lung field: Secondary | ICD-10-CM | POA: Insufficient documentation

## 2020-03-04 DIAGNOSIS — I6782 Cerebral ischemia: Secondary | ICD-10-CM | POA: Diagnosis not present

## 2020-03-04 MED ORDER — GADOBUTROL 1 MMOL/ML IV SOLN
7.5000 mL | Freq: Once | INTRAVENOUS | Status: AC | PRN
  Administered 2020-03-04: 7.5 mL via INTRAVENOUS

## 2020-03-06 ENCOUNTER — Other Ambulatory Visit: Payer: Self-pay

## 2020-03-06 ENCOUNTER — Inpatient Hospital Stay (HOSPITAL_BASED_OUTPATIENT_CLINIC_OR_DEPARTMENT_OTHER): Payer: Medicare Other | Admitting: Nurse Practitioner

## 2020-03-06 ENCOUNTER — Telehealth: Payer: Self-pay | Admitting: *Deleted

## 2020-03-06 ENCOUNTER — Ambulatory Visit
Admission: RE | Admit: 2020-03-06 | Discharge: 2020-03-06 | Disposition: A | Discharge status: Home / Self Care | Payer: Medicare Other | Source: Ambulatory Visit | Attending: Radiation Oncology | Admitting: Radiation Oncology

## 2020-03-06 ENCOUNTER — Inpatient Hospital Stay: Payer: Medicare Other

## 2020-03-06 VITALS — BP 163/110 | HR 97 | Temp 97.6°F | Resp 20 | Wt 164.6 lb

## 2020-03-06 DIAGNOSIS — E538 Deficiency of other specified B group vitamins: Secondary | ICD-10-CM | POA: Insufficient documentation

## 2020-03-06 DIAGNOSIS — Z87891 Personal history of nicotine dependence: Secondary | ICD-10-CM | POA: Diagnosis not present

## 2020-03-06 DIAGNOSIS — R918 Other nonspecific abnormal finding of lung field: Secondary | ICD-10-CM | POA: Diagnosis not present

## 2020-03-06 DIAGNOSIS — Z7901 Long term (current) use of anticoagulants: Secondary | ICD-10-CM | POA: Insufficient documentation

## 2020-03-06 DIAGNOSIS — Z79899 Other long term (current) drug therapy: Secondary | ICD-10-CM | POA: Diagnosis not present

## 2020-03-06 DIAGNOSIS — Z8781 Personal history of (healed) traumatic fracture: Secondary | ICD-10-CM | POA: Diagnosis not present

## 2020-03-06 DIAGNOSIS — C3491 Malignant neoplasm of unspecified part of right bronchus or lung: Secondary | ICD-10-CM

## 2020-03-06 DIAGNOSIS — M5136 Other intervertebral disc degeneration, lumbar region: Secondary | ICD-10-CM | POA: Insufficient documentation

## 2020-03-06 DIAGNOSIS — D649 Anemia, unspecified: Secondary | ICD-10-CM | POA: Diagnosis not present

## 2020-03-06 DIAGNOSIS — M810 Age-related osteoporosis without current pathological fracture: Secondary | ICD-10-CM | POA: Insufficient documentation

## 2020-03-06 DIAGNOSIS — Z801 Family history of malignant neoplasm of trachea, bronchus and lung: Secondary | ICD-10-CM | POA: Insufficient documentation

## 2020-03-06 DIAGNOSIS — Z8673 Personal history of transient ischemic attack (TIA), and cerebral infarction without residual deficits: Secondary | ICD-10-CM | POA: Diagnosis not present

## 2020-03-06 DIAGNOSIS — E785 Hyperlipidemia, unspecified: Secondary | ICD-10-CM | POA: Diagnosis not present

## 2020-03-06 DIAGNOSIS — Z86711 Personal history of pulmonary embolism: Secondary | ICD-10-CM | POA: Diagnosis not present

## 2020-03-06 DIAGNOSIS — Z86718 Personal history of other venous thrombosis and embolism: Secondary | ICD-10-CM | POA: Diagnosis not present

## 2020-03-06 DIAGNOSIS — M1990 Unspecified osteoarthritis, unspecified site: Secondary | ICD-10-CM | POA: Insufficient documentation

## 2020-03-06 DIAGNOSIS — J449 Chronic obstructive pulmonary disease, unspecified: Secondary | ICD-10-CM | POA: Insufficient documentation

## 2020-03-06 DIAGNOSIS — D509 Iron deficiency anemia, unspecified: Secondary | ICD-10-CM | POA: Diagnosis not present

## 2020-03-06 DIAGNOSIS — I10 Essential (primary) hypertension: Secondary | ICD-10-CM | POA: Insufficient documentation

## 2020-03-06 DIAGNOSIS — C3431 Malignant neoplasm of lower lobe, right bronchus or lung: Secondary | ICD-10-CM | POA: Diagnosis not present

## 2020-03-06 DIAGNOSIS — K449 Diaphragmatic hernia without obstruction or gangrene: Secondary | ICD-10-CM | POA: Diagnosis not present

## 2020-03-06 LAB — BASIC METABOLIC PANEL
Anion gap: 12 (ref 5–15)
BUN: 9 mg/dL (ref 8–23)
CO2: 27 mmol/L (ref 22–32)
Calcium: 9.2 mg/dL (ref 8.9–10.3)
Chloride: 103 mmol/L (ref 98–111)
Creatinine, Ser: 0.98 mg/dL (ref 0.44–1.00)
GFR, Estimated: 55 mL/min — ABNORMAL LOW (ref >60)
Glucose, Bld: 139 mg/dL — ABNORMAL HIGH (ref 70–99)
Potassium: 3.6 mmol/L (ref 3.5–5.1)
Sodium: 142 mmol/L (ref 135–145)

## 2020-03-06 LAB — CBC WITH DIFFERENTIAL/PLATELET
Abs Immature Granulocytes: 0.02 10*3/uL (ref 0.00–0.07)
Basophils Absolute: 0.1 10*3/uL (ref 0.0–0.1)
Basophils Relative: 1 %
Eosinophils Absolute: 0.2 10*3/uL (ref 0.0–0.5)
Eosinophils Relative: 3 %
HCT: 36.4 % (ref 36.0–46.0)
Hemoglobin: 11.2 g/dL — ABNORMAL LOW (ref 12.0–15.0)
Immature Granulocytes: 0 %
Lymphocytes Relative: 29 %
Lymphs Abs: 2.1 10*3/uL (ref 0.7–4.0)
MCH: 25.3 pg — ABNORMAL LOW (ref 26.0–34.0)
MCHC: 30.8 g/dL (ref 30.0–36.0)
MCV: 82.2 fL (ref 80.0–100.0)
Monocytes Absolute: 0.7 10*3/uL (ref 0.1–1.0)
Monocytes Relative: 10 %
Neutro Abs: 4.2 10*3/uL (ref 1.7–7.7)
Neutrophils Relative %: 57 %
Platelets: 177 10*3/uL (ref 150–400)
RBC: 4.43 MIL/uL (ref 3.87–5.11)
RDW: 18.2 % — ABNORMAL HIGH (ref 11.5–15.5)
WBC: 7.3 10*3/uL (ref 4.0–10.5)
nRBC: 0 % (ref 0.0–0.2)

## 2020-03-06 NOTE — H&P (View-Only) (Signed)
NEW PATIENT EVALUATION  Name: [name]  MRN: [id number]  Date:   03/06/2020     DOB: 1940-08-31   This 79 y.o. 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  REFERRING PHYSICIAN: [name], FNP  CHIEF COMPLAINT:  Chief Complaint  Patient presents with  . Lung Cancer    Initial consultation    DIAGNOSIS: The encounter diagnosis was Squamous cell carcinoma of lung, right ([place]).   PREVIOUS INVESTIGATIONS:  CT scans and PET CT scans as well as MRI of brain reviewed Clinical notes reviewed Pathology report reviewed  HPI: Patient is a 79 year old female who originally presented back in 2019 with a pulmonary embolism with right heart strain.  She was noted to have a deep vein thrombosis of her right lower extremity.  Patient is also status post right hip arthroplasty May 2019.  In August 2021 she had a CT scan of angiogram of the chest for right upper quadrant pain.  The was found at that time a new right lung pleural thickening which in August on PET CT scan showed hypermetabolic activity of the right lower lobe mass with adenopathy in the azygos esophageal recess and a low right paratracheal node with equivocal SUV activity.  She underwent a CT-guided fine-needle aspiration of the right lung mass showing non-small cell cancer favoring squamous cell carcinoma.  MRI of her brain showed no evidence to suggest metastatic disease.  She has been seen by medical oncology and recommendation for concurrent chemoradiation was made.  She is scheduled to start [place] with concurrent radiation.  She is seen today feels somewhat weak has a slightly productive cough had 1 episode of trace hemoptysis.  PLANNED TREATMENT REGIMEN: Concurrent chemoradiation  PAST MEDICAL HISTORY:  has a past medical history of Acute postoperative pain (02/03/2018), Anemia, B12 deficiency,  Bacteremia (10/07/2014), Bladder incontinence, Blind left eye, Cancer ([place]), Cataract, Cervical spine fracture ([place]), Chest pain (07/29/2013), Compression fracture, COPD (chronic obstructive pulmonary disease) ([place]), DDD (degenerative disc disease), lumbar, Depression, Diffuse myofascial pain syndrome (03/24/2015), Dyspnea, Fever (10/05/2014), GERD (gastroesophageal reflux disease), Hiatal hernia, Hyperlipidemia, Hypertension, Hypertensive kidney disease, malignant (11/07/2016), Macular degeneration, On home oxygen therapy, Osteoarthritis, Osteoporosis, Pneumonia, Pulmonary embolism ([place]) (04/30/2018), Reactive airway disease, Rupture of bowel ([place]), Sepsis ([place]) (10/08/2014), Stroke [facility]), Stroke ([place]), and Wrist pain, acute (09/10/2012).    PAST SURGICAL HISTORY:  Past Surgical History:  Procedure Laterality Date  . ABDOMINAL HYSTERECTOMY    . ABDOMINAL SURGERY    . APPENDECTOMY    . BLADDER SURGERY    . CATARACT EXTRACTION W/ INTRAOCULAR LENS  IMPLANT, BILATERAL Bilateral   . CHOLECYSTECTOMY    . COLON SURGERY    . COLOSTOMY REVERSAL    . ESOPHAGOGASTRODUODENOSCOPY N/A 10/30/2018   Procedure: ESOPHAGOGASTRODUODENOSCOPY (EGD);  Surgeon: Lin Landsman, MD;  Location: [facility] ENDOSCOPY;  Service: Gastroenterology;  Laterality: N/A;  . INTRAMEDULLARY (IM) NAIL INTERTROCHANTERIC Right 09/13/2016   Procedure: INTRAMEDULLARY (IM) NAIL INTERTROCHANTRIC RIGHT;  Surgeon: Leandrew Koyanagi, MD;  Location: [place];  Service: Orthopedics;  Laterality: Right;  . JOINT REPLACEMENT Bilateral   . JOINT REPLACEMENT    . KNEE SURGERY    . OSTOMY    . RECTOCELE REPAIR    . TOE SURGERY Right    3rd  . TOTAL HIP ARTHROPLASTY Right 09/30/2017   Procedure: RIGHT HIP IM NAIL REMOVAL WITH CONVERSION TO TOTAL HIP ARTHOPLASTY,  anterior approach;  Surgeon: Renette Butters, MD;  Location: [place];  Service: Orthopedics;  Laterality: Right;    FAMILY HISTORY: family history includes Cervical cancer in her daughter; Deep vein  thrombosis in her sister and son; Heart attack in her father; Heart failure in her mother; Lung cancer in her sister; Pulmonary embolism in her son.  SOCIAL HISTORY:  reports that she quit smoking about 31 years ago. Her smoking use included cigarettes. She has a 30.00 pack-year smoking history. She has never used smokeless tobacco. She reports previous alcohol use. She reports previous drug use.  ALLERGIES: Ampicillin, Ampicillin-sulbactam sodium, Ambien [zolpidem tartrate], Cyclobenzaprine, Tape, Toradol [ketorolac tromethamine], Sulfamethoxazole-trimethoprim, Zolpidem, Cephalexin, and Tramadol  MEDICATIONS:  Current Outpatient Medications  Medication Sig Dispense Refill  . acetaminophen (TYLENOL) 500 MG tablet Take 2 tablets (1,000 mg total) by mouth in the morning, at noon, and at bedtime.    Marland Kitchen albuterol (ACCUNEB) 1.25 MG/3ML nebulizer solution Take 3 mLs (1.25 mg total) by nebulization every 6 (six) hours as needed for wheezing. 75 mL 1  . albuterol (VENTOLIN HFA) 108 (90 Base) MCG/ACT inhaler Inhale 2 puffs into the lungs every 6 (six) hours as needed for wheezing or shortness of breath. 1 Inhaler 2  . alendronate (FOSAMAX) 70 MG tablet TAKE 1 TABLET BY MOUTH ONCE A WEEK 12 tablet 1  . ALPRAZolam (XANAX) 1 MG tablet TAKE 0.5-1 TABLETS (0.5-1 MG TOTAL) BY MOUTH 2 (TWO) TIMES DAILY AS NEEDED FOR ANXIETY. 60 tablet 0  . amLODipine (NORVASC) 5 MG tablet TAKE 1 TABLET BY MOUTH EVERY DAY 90 tablet 1  . diclofenac Sodium (VOLTAREN) 1 % GEL Apply 4 g topically in the morning, at noon, and at bedtime. Around or in place of lidoderm patch 100 g 0  . docusate sodium (COLACE) 100 MG capsule Take 200 mg by mouth 2 (two) times daily as needed for moderate constipation.     . DULoxetine (CYMBALTA) 20 MG capsule TAKE 2 CAPSULES BY MOUTH EVERY DAY 180 capsule 0  . ELIQUIS 2.5 MG TABS tablet TAKE 1 TABLET BY MOUTH TWICE A DAY 60 tablet 3  . esomeprazole (NEXIUM) 40 MG capsule TAKE 1 CAPSULE BY MOUTH EVERY DAY  90 capsule 1  . fentaNYL (DURAGESIC) 25 MCG/HR Place 1 patch onto the skin every 3 (three) days.    Marland Kitchen FLOVENT HFA 220 MCG/ACT inhaler TAKE 1 PUFF BY MOUTH TWICE A DAY 24 Inhaler 5  . fluticasone (FLONASE) 50 MCG/ACT nasal spray SPRAY 2 SPRAYS INTO EACH NOSTRIL EVERY DAY 48 mL 1  . lidocaine-prilocaine (EMLA) cream Apply to affected area once 30 g 3  . LINZESS 290 MCG CAPS capsule TAKE 1 CAPSULE BY MOUTH EVERY DAY 90 capsule 1  . lovastatin (MEVACOR) 10 MG tablet TAKE 1 TABLET BY MOUTH EVERY DAY 90 tablet 0  . mirtazapine (REMERON) 7.5 MG tablet TAKE 1 TABLET (7.5 MG TOTAL) BY MOUTH AT BEDTIME. 90 tablet 1  . NARCAN 4 MG/0.1ML LIQD nasal spray kit     . ondansetron (ZOFRAN ODT) 4 MG disintegrating tablet Take 1 tablet (4 mg total) by mouth every 8 (eight) hours as needed for nausea or vomiting. 30 tablet 0  . orphenadrine (NORFLEX) 100 MG tablet 100 mg as needed.    . Oxycodone HCl 10 MG TABS Take 10 mg by mouth 3 (three) times daily.    . polyethylene glycol (MIRALAX / GLYCOLAX) packet Take 17 g by mouth daily as needed (for constipation.).     Marland Kitchen  pregabalin (LYRICA) 100 MG capsule Take 1 capsule (100 mg total) by mouth 2 (two) times daily. 180 capsule 0  . prochlorperazine (COMPAZINE) 10 MG tablet Take 1 tablet (10 mg total) by mouth every 6 (six) hours as needed (Nausea or vomiting). 30 tablet 1  . promethazine (PHENERGAN) 12.5 MG tablet Take 1 tablet (12.5 mg total) by mouth every 8 (eight) hours as needed for nausea or vomiting. 30 tablet 1  . traZODone (DESYREL) 50 MG tablet TAKE 0.5-1.5 TABLETS (25-75 MG TOTAL) BY MOUTH AT BEDTIME AS NEEDED FOR SLEEP. 135 tablet 1  . montelukast (SINGULAIR) 10 MG tablet Take 1 tablet (10 mg total) by mouth at bedtime. (Patient not taking: Reported on 03/06/2020) 21 tablet 0   No current facility-administered medications for this encounter.    ECOG PERFORMANCE STATUS:  1 - Symptomatic but completely ambulatory  REVIEW OF SYSTEMS: Patient denies any  weight loss, fatigue, weakness, fever, chills or night sweats. Patient denies any loss of vision, blurred vision. Patient denies any ringing  of the ears or hearing loss. No irregular heartbeat. Patient denies heart murmur or history of fainting. Patient denies any chest pain or pain radiating to her upper extremities. Patient denies any shortness of breath, difficulty breathing at night, cough or hemoptysis. Patient denies any swelling in the lower legs. Patient denies any nausea vomiting, vomiting of blood, or coffee ground material in the vomitus. Patient denies any stomach pain. Patient states has had normal bowel movements no significant constipation or diarrhea. Patient denies any dysuria, hematuria or significant nocturia. Patient denies any problems walking, swelling in the joints or loss of balance. Patient denies any skin changes, loss of hair or loss of weight. Patient denies any excessive worrying or anxiety or significant depression. Patient denies any problems with insomnia. Patient denies excessive thirst, polyuria, polydipsia. Patient denies any swollen glands, patient denies easy bruising or easy bleeding. Patient denies any recent infections, allergies or URI. Patient "s visual fields have not changed significantly in recent time.   PHYSICAL EXAM: BP (!) 163/110 (BP Location: Right Arm, Patient Position: Sitting, Cuff Size: Normal)   Pulse 97   Temp 97.6 F (36.4 C)   Resp 20   Wt 164 lb 9.6 oz (74.7 kg)   BMI 27.39 kg/m  Well-developed well-nourished patient in NAD. HEENT reveals PERLA, EOMI, discs not visualized.  Oral cavity is clear. No oral mucosal lesions are identified. Neck is clear without evidence of cervical or supraclavicular adenopathy. Lungs are clear to A&P. Cardiac examination is essentially unremarkable with regular rate and rhythm without murmur rub or thrill. Abdomen is benign with no organomegaly or masses noted. Motor sensory and DTR levels are equal and symmetric in  the upper and lower extremities. Cranial nerves II through XII are grossly intact. Proprioception is intact. No peripheral adenopathy or edema is identified. No motor or sensory levels are noted. Crude visual fields are within normal range.  LABORATORY DATA: Pathology report reviewed     RADIOLOGY RESULTS: CT scans PET CT scans and MRI of brain all reviewed compatible with above-stated findings   IMPRESSION: Stage III a probable squamous cell carcinoma the right lower lobe in 79 year old female  PLAN: Present time I have recommended concurrent chemoradiation.  With plan on IMRT radiation therapy to [place] the areas of hypermetabolic activity including the primary of the right lower lobe as well as mediastinal nodes.  I would choose IMRT to avoid critical structures such as the heart spinal cord and esophagus.  Risks and benefits of treatment including possible dysphagia fatigue alteration blood counts development of cough skin reaction all were discussed in detail with the patient.  She seems to comprehend my treatment plan well.  I have personally set up and ordered CT simulation for later this week.  We will coordinate her chemotherapy with medical oncology.  There will be extra effort by both professional staff as well as technical staff to coordinate and manage concurrent chemoradiation and ensuing side effects during her treatments.  I would like to take this opportunity to thank you for allowing me to participate in the care of your patient.Noreene Filbert, MD

## 2020-03-06 NOTE — Progress Notes (Signed)
Virtual Visit Progress Note  [facility] [place]  Telephone:([phone] [phone]  Patient Care Team: [name], FNP as PCP - General (Nurse Practitioner) Kate Sable, MD as PCP - Cardiology (Cardiology) [name], FNP (Nurse Practitioner) Telford Nab, RN as Oncology Nurse Navigator   Name of the patient: [name]  [id number]  25-Sep-1940   Date of visit: 03/06/20  I connected with [name] on 03/06/20 at 11:30 AM EDT by telephone visit and verified that I am speaking with the correct person using two identifiers.   I discussed the limitations, risks, security and privacy concerns of performing an evaluation and management service by telemedicine and the availability of in-person appointments. I also discussed with the patient that there may be a patient responsible charge related to this service. The patient expressed understanding and agreed to proceed.   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  Chief complaint/Reason for visit- Initial Meeting for [facility], preparing for starting chemotherapy  Heme/Onc history:  Oncology History  Squamous cell carcinoma of lung, right ([place])  02/25/2020 Initial Diagnosis   Squamous cell carcinoma of lung, right ([place])   02/25/2020 Cancer Staging   Staging form: Lung, AJCC 8th Edition - Clinical: cT1, cN2, cM0 - Signed by Earlie Server, MD on 02/25/2020   02/26/2020 -  Chemotherapy   The patient had palonosetron (ALOXI) injection 0.25 mg, 0.25 mg, Intravenous,  Once, 0 of 4 cycles CARBOplatin (PARAPLATIN) in sodium chloride 0.9 % 100 mL chemo infusion,  (original dose ), Intravenous,  Once, 0 of 4 cycles Dose modification:   (Cycle  1) PACLitaxel (TAXOL) 84 mg in sodium chloride 0.9 % 250 mL chemo infusion (</= 34m/m2), 45 mg/m2, Intravenous,  Once, 0 of 4 cycles  for chemotherapy treatment.      Interval history-  BAryaa Bunting 78year old female newly diagnosed with stage IIIa non-small cell lung cancer of the right lower lobe, who presents to chemo care clinic today for initial meeting in preparation for starting chemotherapy. I introduced the chemo care clinic and we discussed that the role of the clinic is to assist those who are at an increased risk of emergency room visits and/or complications during the course of chemotherapy treatment. We discussed that the increased risk takes into account factors such as age, performance status, and co-morbidities. We also discussed that for some, this might include barriers to care such as not having a primary care provider, lack of insurance/transportation, or not being able to afford medications. We discussed that the goal of the program is to help prevent unplanned ER visits and help reduce complications during chemotherapy. We do this by discussing specific risk factors to each individual and identifying ways that we can help improve these risk factors and reduce barriers to care.   ECOG FS:2 - Symptomatic, <50% confined to bed  Review of systems- Review of Systems  Constitutional: Negative for chills, fever, malaise/fatigue and weight loss.  HENT: Negative for hearing loss, nosebleeds, sore throat and tinnitus.   Eyes: Negative for blurred vision and double vision.  Respiratory: Negative for cough, hemoptysis, shortness of breath and wheezing.   Cardiovascular: Negative for chest pain, palpitations and leg swelling.  Gastrointestinal: Negative for abdominal pain, blood in stool, constipation, diarrhea, melena,  nausea and vomiting.  Genitourinary: Negative for dysuria and urgency.  Musculoskeletal: Negative for back pain, falls, joint pain and myalgias.  Skin: Negative for  itching and rash.  Neurological: Negative for dizziness, tingling, sensory change, loss of consciousness, weakness and headaches.  Endo/Heme/Allergies: Negative for environmental allergies. Does not bruise/bleed easily.  Psychiatric/Behavioral: Negative for depression. The patient is not nervous/anxious and does not have insomnia.      Allergies  Allergen Reactions  . Ampicillin Anaphylaxis, Nausea Only and Swelling    SEVERE HEADACHE MUSCLE CRAMPS ANGIOEDEMA THRUSH PATIENT HAS HAD A PCN REACTION WITH IMMEDIATE RASH, FACIAL/TONGUE/THROAT SWELLING, SOB, OR LIGHTHEADEDNESS WITH HYPOTENSION:  #  #  YES  #  #  Has patient had a PCN reaction causing severe rash involving mucus membranes or skin necrosis: No Has patient had a PCN reaction that required hospitalization:No Has patient had a PCN reaction occurring within the last 10 years: No.  . Ampicillin-Sulbactam Sodium Anaphylaxis and Other (See Comments)    SEVERE HEADACHE MUSCLE CRAMPS ANGIOEDEMA THRUSH PATIENT HAS HAD A PCN REACTION WITH IMMEDIATE RASH, FACIAL/TONGUE/THROAT SWELLING, SOB, OR LIGHTHEADEDNESS WITH HYPOTENSION: # # YES # # Has patient had a PCN reaction causing severe rash involving mucus membranes or skin necrosis: No Has patient had a PCN reaction that required hospitalization:No Has patient had a PCN reaction occurring within the last 10 years: No  SEVERE HEADACHE MUSCLE CRAMPS ANGIOEDEMA THRUSH PATIENT HAS HAD A PCN REACTION WITH IMMEDIATE RASH, FACIAL/TONGUE/THROAT SWELLING, SOB, OR LIGHTHEADEDNESS WITH HYPOTENSION: # # YES # # Has patient had a PCN reaction causing severe rash involving mucus membranes or skin necrosis: No Has patient had a PCN reaction that required hospitalization:No Has patient had a PCN reaction occurring within the last 10 years: No Muscles draw up tight - severe headache angioedema  . Ambien [Zolpidem Tartrate] Nausea And Vomiting and Other (See Comments)    HALLUCINATIONS SWEATING  .  Cyclobenzaprine Other (See Comments)    EXTRAPYRAMIDAL MOVEMENT INVOLUNTARY MUSCLE JERKING EXTRAPYRAMIDAL MOVEMENT INVOLUNTARY MUSCLE JERKING Gets the jerks  . Tape Itching, Rash and Other (See Comments)    Paper tape only. Adhesive tape=itching/burning/rash  . Toradol [Ketorolac Tromethamine] Nausea Only and Other (See Comments)    HEADACHE  BACKACHE  . Sulfamethoxazole-Trimethoprim     UNSPECIFIED REACTION   . Zolpidem     Hallucinations  . Cephalexin Rash and Other (See Comments)    HEADACHES  . Tramadol Rash and Other (See Comments)    HEADACHE     Past Medical History:  Diagnosis Date  . Acute postoperative pain 02/03/2018  . Anemia   . B12 deficiency   . Bacteremia 10/07/2014  . Bladder incontinence   . Blind left eye   . Cancer ([place])    skin cancer   . Cataract   . Cervical spine fracture ([place])   . Chest pain 07/29/2013  . Compression fracture    C7,  upper T spine -compression fx  . COPD (chronic obstructive pulmonary disease) ([place])   . DDD (degenerative disc disease), lumbar   . Depression    major  . Diffuse myofascial pain syndrome 03/24/2015  . Dyspnea    at times- when activty  . Fever 10/05/2014  . GERD (gastroesophageal reflux disease)   . Hiatal hernia   . Hyperlipidemia   . Hypertension   . 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 Macular degeneration   . On home oxygen therapy    "3L; all the time" (04/30/2018)  . Osteoarthritis   .  Osteoporosis   . Pneumonia    years ago  . Pulmonary embolism ([place]) 04/30/2018  . Reactive airway disease   . Rupture of bowel ([place])   . Sepsis ([place]) 10/08/2014  . Stroke [facility])    "mini stroke"- balance and memory issue  . Stroke ([place])   . Wrist pain, acute 09/10/2012    Past Surgical History:  Procedure Laterality Date  . ABDOMINAL HYSTERECTOMY    . ABDOMINAL SURGERY    . APPENDECTOMY    . BLADDER SURGERY    . CATARACT EXTRACTION W/ INTRAOCULAR LENS  IMPLANT, BILATERAL Bilateral   .  CHOLECYSTECTOMY    . COLON SURGERY    . COLOSTOMY REVERSAL    . ESOPHAGOGASTRODUODENOSCOPY N/A 10/30/2018   Procedure: ESOPHAGOGASTRODUODENOSCOPY (EGD);  Surgeon: Lin Landsman, MD;  Location[facility] ENDOSCOPY;  Service: Gastroenterology;  Laterality: N/A;  . INTRAMEDULLARY (IM) NAIL INTERTROCHANTERIC Right 09/13/2016   Procedure: INTRAMEDULLARY (IM) NAIL INTERTROCHANTRIC RIGHT;  Surgeon: Leandrew Koyanagi, MD;  Location: [facility];  Service: Orthopedics;  Laterality: Right;  . JOINT REPLACEMENT Bilateral   . JOINT REPLACEMENT    . KNEE SURGERY    . OSTOMY    . RECTOCELE REPAIR    . TOE SURGERY Right    3rd  . TOTAL HIP ARTHROPLASTY Right 09/30/2017   Procedure: RIGHT HIP IM NAIL REMOVAL WITH CONVERSION TO TOTAL HIP ARTHOPLASTY, anterior approach;  Surgeon: Renette Butters, MD;  Location: [facility];  Service: Orthopedics;  Laterality: Right;    Social History   Socioeconomic History  . Marital status: Widowed    Spouse name: Not on file  . Number of children: 3  . Years of education: middle sch  . Highest education level: Not on file  Occupational History  . Occupation: retired    Comment: n/a  Tobacco Use  . Smoking status: Former Smoker    Packs/day: 1.00    Years: 30.00    Pack years: 30.00    Types: Cigarettes    Quit date: 1990    Years since quitting: 31.7  . Smokeless tobacco: Never Used  Vaping Use  . Vaping Use: Former  Substance and Sexual Activity  . Alcohol use: Not Currently  . Drug use: Not Currently  . Sexual activity: Not Currently  Other Topics Concern  . Not on file  Social History Narrative   ** Merged History Encounter **       ** Merged History Encounter **       Patient lives at home with daughter. Caffeine Use: 4 cups daily   Social Determinants of Health   Financial Resource Strain: Low Risk   . Difficulty of Paying Living Expenses: Not hard at all  Food Insecurity: No Food Insecurity  . Worried About Charity fundraiser in the Last Year: Never  true  . Ran Out of Food in the Last Year: Never true  Transportation Needs: No Transportation Needs  . Lack of Transportation (Medical): No  . Lack of Transportation (Non-Medical): No  Physical Activity: Inactive  . Days of Exercise per Week: 0 days  . Minutes of Exercise per Session: 0 min  Stress: Stress Concern Present  . Feeling of Stress : Very much  Social Connections:   . Frequency of Communication with Friends and Family: Not on file  . Frequency of Social Gatherings with Friends and Family: Not on file  . Attends Religious Services: Not on file  . Active Member of Clubs or Organizations: Not on file  .  Attends Archivist Meetings: Not on file  . Marital Status: Not on file  Intimate Partner Violence: Not At Risk  . Fear of Current or Ex-Partner: No  . Emotionally Abused: No  . Physically Abused: No  . Sexually Abused: No    Family History  Problem Relation Age of Onset  . Heart failure Mother   . Heart attack Father   . Lung cancer Sister   . Deep vein thrombosis Sister   . Pulmonary embolism Son   . Deep vein thrombosis Son   . Cervical cancer Daughter      Current Outpatient Medications:  .  acetaminophen (TYLENOL) 500 MG tablet, Take 2 tablets (1,000 mg total) by mouth in the morning, at noon, and at bedtime., Disp: , Rfl:  .  albuterol (ACCUNEB) 1.25 MG/3ML nebulizer solution, Take 3 mLs (1.25 mg total) by nebulization every 6 (six) hours as needed for wheezing., Disp: 75 mL, Rfl: 1 .  albuterol (VENTOLIN HFA) 108 (90 Base) MCG/ACT inhaler, Inhale 2 puffs into the lungs every 6 (six) hours as needed for wheezing or shortness of breath., Disp: 1 Inhaler, Rfl: 2 .  alendronate (FOSAMAX) 70 MG tablet, TAKE 1 TABLET BY MOUTH ONCE A WEEK, Disp: 12 tablet, Rfl: 1 .  ALPRAZolam (XANAX) 1 MG tablet, TAKE 0.5-1 TABLETS (0.5-1 MG TOTAL) BY MOUTH 2 (TWO) TIMES DAILY AS NEEDED FOR ANXIETY., Disp: 60 tablet, Rfl: 0 .  amLODipine (NORVASC) 5 MG tablet, TAKE 1 TABLET  BY MOUTH EVERY DAY, Disp: 90 tablet, Rfl: 1 .  diclofenac Sodium (VOLTAREN) 1 % GEL, Apply 4 g topically in the morning, at noon, and at bedtime. Around or in place of lidoderm patch, Disp: 100 g, Rfl: 0 .  docusate sodium (COLACE) 100 MG capsule, Take 200 mg by mouth 2 (two) times daily as needed for moderate constipation. , Disp: , Rfl:  .  DULoxetine (CYMBALTA) 20 MG capsule, TAKE 2 CAPSULES BY MOUTH EVERY DAY, Disp: 180 capsule, Rfl: 0 .  ELIQUIS 2.5 MG TABS tablet, TAKE 1 TABLET BY MOUTH TWICE A DAY, Disp: 60 tablet, Rfl: 3 .  esomeprazole (NEXIUM) 40 MG capsule, TAKE 1 CAPSULE BY MOUTH EVERY DAY, Disp: 90 capsule, Rfl: 1 .  fentaNYL (DURAGESIC) 25 MCG/HR, Place 1 patch onto the skin every 3 (three) days., Disp: , Rfl:  .  FLOVENT HFA 220 MCG/ACT inhaler, TAKE 1 PUFF BY MOUTH TWICE A DAY, Disp: 24 Inhaler, Rfl: 5 .  fluticasone (FLONASE) 50 MCG/ACT nasal spray, SPRAY 2 SPRAYS INTO EACH NOSTRIL EVERY DAY, Disp: 48 mL, Rfl: 1 .  lidocaine-prilocaine (EMLA) cream, Apply to affected area once, Disp: 30 g, Rfl: 3 .  LINZESS 290 MCG CAPS capsule, TAKE 1 CAPSULE BY MOUTH EVERY DAY, Disp: 90 capsule, Rfl: 1 .  lovastatin (MEVACOR) 10 MG tablet, TAKE 1 TABLET BY MOUTH EVERY DAY, Disp: 90 tablet, Rfl: 0 .  mirtazapine (REMERON) 7.5 MG tablet, TAKE 1 TABLET (7.5 MG TOTAL) BY MOUTH AT BEDTIME., Disp: 90 tablet, Rfl: 1 .  montelukast (SINGULAIR) 10 MG tablet, Take 1 tablet (10 mg total) by mouth at bedtime. (Patient not taking: Reported on 03/06/2020), Disp: 21 tablet, Rfl: 0 .  NARCAN 4 MG/0.1ML LIQD nasal spray kit, , Disp: , Rfl:  .  ondansetron (ZOFRAN ODT) 4 MG disintegrating tablet, Take 1 tablet (4 mg total) by mouth every 8 (eight) hours as needed for nausea or vomiting., Disp: 30 tablet, Rfl: 0 .  orphenadrine (NORFLEX) 100 MG tablet, 100  mg as needed., Disp: , Rfl:  .  Oxycodone HCl 10 MG TABS, Take 10 mg by mouth 3 (three) times daily., Disp: , Rfl:  .  polyethylene glycol (MIRALAX / GLYCOLAX)  packet, Take 17 g by mouth daily as needed (for constipation.). , Disp: , Rfl:  .  pregabalin (LYRICA) 100 MG capsule, Take 1 capsule (100 mg total) by mouth 2 (two) times daily., Disp: 180 capsule, Rfl: 0 .  prochlorperazine (COMPAZINE) 10 MG tablet, Take 1 tablet (10 mg total) by mouth every 6 (six) hours as needed (Nausea or vomiting)., Disp: 30 tablet, Rfl: 1 .  promethazine (PHENERGAN) 12.5 MG tablet, Take 1 tablet (12.5 mg total) by mouth every 8 (eight) hours as needed for nausea or vomiting., Disp: 30 tablet, Rfl: 1 .  traZODone (DESYREL) 50 MG tablet, TAKE 0.5-1.5 TABLETS (25-75 MG TOTAL) BY MOUTH AT BEDTIME AS NEEDED FOR SLEEP., Disp: 135 tablet, Rfl: 1  Physical exam: There were no vitals filed for this visit. Physical Exam Constitutional:      Appearance: She is well-developed.  HENT:     Head: Atraumatic.     Nose: Nose normal.     Mouth/Throat:     Pharynx: No oropharyngeal exudate.  Eyes:     General: No scleral icterus.    Conjunctiva/sclera: Conjunctivae normal.  Cardiovascular:     Rate and Rhythm: Normal rate and regular rhythm.  Pulmonary:     Effort: Pulmonary effort is normal.     Breath sounds: Normal breath sounds.  Abdominal:     General: Bowel sounds are normal. There is no distension.     Palpations: Abdomen is soft.  Musculoskeletal:        General: Normal range of motion.     Cervical back: Normal range of motion and neck supple.  Skin:    General: Skin is warm and dry.  Neurological:     Mental Status: She is alert and oriented to person, place, and time.  Psychiatric:     Comments: forgetful      CMP Latest Ref Rng & Units 03/06/2020  Glucose 70 - 99 mg/dL 139(H)  BUN 8 - 23 mg/dL 9  Creatinine 0.44 - 1.00 mg/dL 0.98  Sodium 135 - 145 mmol/L 142  Potassium 3.5 - 5.1 mmol/L 3.6  Chloride 98 - 111 mmol/L 103  CO2 22 - 32 mmol/L 27  Calcium 8.9 - 10.3 mg/dL 9.2  Total Protein 6.5 - 8.1 g/dL -  Total Bilirubin 0.3 - 1.2 mg/dL -  Alkaline  Phos 38 - 126 U/L -  AST 15 - 41 U/L -  ALT 0 - 44 U/L -   CBC Latest Ref Rng & Units 03/06/2020  WBC 4.0 - 10.5 K/uL 7.3  Hemoglobin 12.0 - 15.0 g/dL 11.2(L)  Hematocrit 36 - 46 % 36.4  Platelets 150 - 400 K/uL 177    No images are attached to the encounter.  MR Brain W Wo Contrast  Result Date: 03/05/2020 CLINICAL DATA:  New diagnosis squamous cell carcinoma of the lung. Staging. EXAM: MRI HEAD WITHOUT AND WITH CONTRAST TECHNIQUE: Multiplanar, multiecho pulse sequences of the brain and surrounding structures were obtained without and with intravenous contrast. CONTRAST:  7.70m GADAVIST GADOBUTROL 1 MMOL/ML IV SOLN COMPARISON:  Head CT 08/28/2019.  Brain MRI 06/11/2013. FINDINGS: Brain: Diffusion imaging does not show any acute or subacute infarction. Mild chronic small-vessel change affects the pons. 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 Mild chronic small-vessel ischemic changes of  the cerebral hemispheric deep white matter. No cortical or large vessel territory infarction. After contrast administration, no abnormal enhancement occurs. No sign of metastatic disease. Vascular: Major vessels at the base of the brain show flow. Skull and upper cervical spine: Negative Sinuses/Orbits: Clear/normal Other: None IMPRESSION: 1. No evidence of metastatic disease. 2. Mild chronic small-vessel ischemic changes as outlined above. Electronically Signed   By: Nelson Chimes M.D.   On: 03/05/2020 05:48   CT BIOPSY  Result Date: 02/21/2020 CLINICAL DATA:  Persistent posterior right lower lobe lung mass demonstrating increased metabolic activity by PET scan. The patient presents for CT-guided biopsy of the lung mass. EXAM: CT GUIDED CORE BIOPSY OF RIGHT LOWER LOBE LUNG MASS ANESTHESIA/SEDATION: 2.0 mg IV Versed; 100 mcg IV Fentanyl Total Moderate Sedation Time:  26 minutes. The patient's level of consciousness and physiologic status were continuously monitored during the procedure by Radiology  nursing. PROCEDURE: The procedure risks, benefits, and alternatives were explained to the patient. Questions regarding the procedure were encouraged and answered. The patient understands and consents to the procedure. A time-out was performed prior to initiating the procedure. CT was performed through the chest in a prone position. The right posterior chest wall was prepped with chlorhexidine in a sterile fashion, and a sterile drape was applied covering the operative field. A sterile gown and sterile gloves were used for the procedure. Local anesthesia was provided with 1% Lidocaine. Under CT guidance, a 17 gauge trocar needle was advanced to the level of a posterior right lower lobe lung mass. After confirming needle tip position, 2 separate coaxial 18 gauge core biopsy samples were obtained and submitted in formalin. The BioSentry device was used in depositing a plug at the pleural entry site. Additional CT was performed after outer needle removal. COMPLICATIONS: None FINDINGS: Posterior subpleural right lower lobe lung mass again noted measuring approximately 3.0 x 3.9 cm in greatest transverse dimensions. Solid tissue was obtained. There was no evidence of regional hemorrhage or pneumothorax immediately following the procedure by CT. The patient will recover for at least 2 hours after the procedure. IMPRESSION: CT-guided core biopsy performed of a 3.9 cm right lower lobe subpleural mass. Electronically Signed   By: Aletta Edouard M.D.   On: 02/21/2020 12:46   ECHOCARDIOGRAM COMPLETE  Result Date: 02/15/2020    ECHOCARDIOGRAM REPORT   Patient Name:   SHANEKA EFAW Date of Exam: 02/15/2020 Medical Rec #:  [id number]       Height:       65.0 in Accession #:    [id number]      Weight:       162.0 lb Date of Birth:  24-Jul-1940        BSA:          1.809 m Patient Age:    39 years        BP:           138/84 mmHg Patient Gender: F               HR:           92 bpm. Exam Location:  [place] Procedure: 2D Echo,  Cardiac Doppler and Color Doppler Indications:    R06.02 SOB  History:        Patient has prior history of Echocardiogram examinations, most                 recent 05/02/2018. COPD, Arrythmias:Tachycardia,  Signs/Symptoms:Chest Pain and Syncope; Risk                 Factors:Hypertension, Dyslipidemia and Non-Smoker. Anemia                 Interstitial lung disease.  Sonographer:    Pilar Jarvis RDMS, RVT, RDCS Referring Phys: [id number] [name]  Sonographer Comments: This was a poor exam due to several factors. Supine imaging was best but patient was in pain on her back. The patient was intolerant to all transducer pressure and had trouble executing a proper breath hold. Unknown acoustic interference possible from COPD or other lung condition IMPRESSIONS  1. Left ventricular ejection fraction, by estimation, is >55%. The left ventricle has normal function. Left ventricular endocardial border not optimally defined to evaluate regional wall motion. There is mild left ventricular hypertrophy. Left ventricular diastolic parameters are indeterminate.  2. Pulmonary artery pressure is at least upper normal to mildly elevated (PASP 30-35 mmHg plus central venous pressure). Right ventricular systolic function is normal. The right ventricular size is normal. Mildly increased right ventricular wall thickness.  3. The mitral valve is grossly normal. No evidence of mitral valve regurgitation.  4. The aortic valve has an indeterminant number of cusps. There is moderate calcification of the aortic valve. There is moderate thickening of the aortic valve. Aortic valve regurgitation is not visualized. Mild to moderate aortic valve sclerosis/calcification is present, without any evidence of aortic stenosis.  5. Aortic dilatation noted. There is borderline dilatation of the ascending aorta, measuring 36 mm.  6. Mildly dilated pulmonary artery. FINDINGS  Left Ventricle: Left ventricular ejection fraction, by  estimation, is >55%. The left ventricle has normal function. Left ventricular endocardial border not optimally defined to evaluate regional wall motion. The left ventricular internal cavity size was  normal in size. There is mild left ventricular hypertrophy. Left ventricular diastolic parameters are indeterminate. Right Ventricle: Pulmonary artery pressure is at least upper normal to mildly elevated (PASP 30-35 mmHg plus central venous pressure). The right ventricular size is normal. Mildly increased right ventricular wall thickness. Right ventricular systolic function is normal. Left Atrium: Left atrial size was normal in size. Right Atrium: Right atrial size was not well visualized. Pericardium: There is no evidence of pericardial effusion. Mitral Valve: The mitral valve is grossly normal. No evidence of mitral valve regurgitation. Tricuspid Valve: The tricuspid valve is not well visualized. Tricuspid valve regurgitation is trivial. Aortic Valve: The non-coronary leaflet appears fixed. The aortic valve has an indeterminant number of cusps. There is moderate calcification of the aortic valve. There is moderate thickening of the aortic valve. Aortic valve regurgitation is not visualized. Mild to moderate aortic valve sclerosis/calcification is present, without any evidence of aortic stenosis. Aortic valve mean gradient measures 8.0 mmHg. Aortic valve peak gradient measures 12.7 mmHg. Aortic valve area, by VTI measures 2.11 cm. Pulmonic Valve: The pulmonic valve was normal in structure. Pulmonic valve regurgitation is not visualized. No evidence of pulmonic stenosis. Aorta: Aortic dilatation noted. There is borderline dilatation of the ascending aorta, measuring 36 mm. Pulmonary Artery: The pulmonary artery is mildly dilated. Venous: The inferior vena cava was not well visualized. IAS/Shunts: The interatrial septum was not well visualized.  LEFT VENTRICLE PLAX 2D LVIDd:         4.20 cm  Diastology LVIDs:          3.10 cm  LV e' medial:    5.66 cm/s LV PW:  1.10 cm  LV E/e' medial:  11.1 LV IVS:        1.10 cm  LV e' lateral:   11.60 cm/s LVOT diam:     2.10 cm  LV E/e' lateral: 5.4 LV SV:         65 LV SV Index:   36 LVOT Area:     3.46 cm  RIGHT VENTRICLE             IVC RV S prime:     11.60 cm/s  IVC diam: 2.10 cm LEFT ATRIUM           Index LA diam:      3.50 cm 1.94 cm/m LA Vol (A2C): 54.3 ml 30.02 ml/m  AORTIC VALVE AV Area (Vmax):    1.79 cm AV Area (Vmean):   1.74 cm AV Area (VTI):     2.11 cm AV Vmax:           178.50 cm/s AV Vmean:          133.500 cm/s AV VTI:            0.308 m AV Peak Grad:      12.7 mmHg AV Mean Grad:      8.0 mmHg LVOT Vmax:         92.00 cm/s LVOT Vmean:        67.100 cm/s LVOT VTI:          0.188 m LVOT/AV VTI ratio: 0.61  AORTA Ao Root diam: 2.80 cm Ao Asc diam:  3.60 cm MITRAL VALVE                TRICUSPID VALVE MV Area (PHT): 2.53 cm     TR Peak grad:   31.4 mmHg MV Decel Time: 300 msec     TR Vmax:        280.00 cm/s MV E velocity: 62.60 cm/s MV A velocity: 102.00 cm/s  SHUNTS MV E/A ratio:  0.61         Systemic VTI:  0.19 m                             Systemic Diam: 2.10 cm Nelva Bush MD Electronically signed by Nelva Bush MD Signature Date/Time: 02/15/2020/5:09:07 PM    Final      Assessment and plan- Patient is a 79 y.o. female who [facility] to [facility] for initial meeting in preparation for starting chemotherapy for the treatment of    1. Non Small Cell Lung Cancer-plan for concurrent chemotherapy and radiation. Care managed by Dr. Tasia Catchings and Dr. Baruch Gouty. NCCN reviewed and discussed today. Side effects of treatment discussed today and well as how to contact clinic for assistance with management.   2. Chemo Care Clinic/High Risk for ER/Hospitalization during chemotherapy- We discussed the role of the chemo care clinic and identified patient specific risk factors. I discussed that patient was identified as high risk primarily based on: Previous ER  utilization, comorbidities, medicaid and medicare status, not identifying as being in a relationship. We discussed Symptom Management Clinic at [facility] for acute issues and patient will be followed by nurse navigator.   3. Social Determinants of Health- we discussed that social determinants of health may have significant impacts on health and outcomes for cancer patients.  Today we discussed specific social determinants of performance status, alcohol use, depression, financial needs, food insecurity, housing, interpersonal violence, social connections, stress, tobacco use,  and transportation.  After lengthy discussion she declined any specific needs today.  We discussed a variety of programs available through the cancer center including outpatient occupational and physical therapy, care program, counseling services, financial assistance, food pantry, support groups, opportunities for social connection, survivorship, smoking cessation programs, and transportation assistance. She is a former smoker.   4. Palliative Care- based on stage of cancer and/or identified needs today, I will refer patient to palliative care for goals of care and advanced care planning.  5. Hypoalbuminemia- likely secondary to malnutrition. Encouraged protein shake such as ensure or boost daily along with physical activity as tolerated. Will refer to dietician for monitoring of weights and trends.    She denies needing other specific assistance at this time   Return to clinic as scheduled  Visit Diagnosis 1. Squamous cell carcinoma of lung, right ([place])    I discussed the assessment and treatment plan with the patient. The patient was provided an opportunity to ask questions and all were answered. The patient agreed with the plan and demonstrated an understanding of the instructions.   The patient was advised to call back or seek an in-person evaluation if the symptoms worsen or if the condition fails to improve as anticipated.    I provided 25 minutes of non face-to-face telephone visit time during this encounter, and > 50% was spent counseling as documented under my assessment & plan.  Beckey Rutter, DNP, AGNP-C [place] at [facility] [phone] (clinic)

## 2020-03-06 NOTE — Telephone Encounter (Signed)
Contacted patient. Explained that Lauren, NP was not available at 11:30 am today for the chemocare class. Patient would like Lauren to do a video chat at 3pm at Phone # [phone].  Her other phone number does not have any video chat capability.

## 2020-03-06 NOTE — Consult Note (Signed)
NEW PATIENT EVALUATION  Name: [name]  MRN: [id number]  Date:   03/06/2020     DOB: 1940-08-31   This 79 y.o. 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  REFERRING PHYSICIAN: [name], FNP  CHIEF COMPLAINT:  Chief Complaint  Patient presents with  . Lung Cancer    Initial consultation    DIAGNOSIS: The encounter diagnosis was Squamous cell carcinoma of lung, right ([place]).   PREVIOUS INVESTIGATIONS:  CT scans and PET CT scans as well as MRI of brain reviewed Clinical notes reviewed Pathology report reviewed  HPI: Patient is a 79 year old female who originally presented back in 2019 with a pulmonary embolism with right heart strain.  She was noted to have a deep vein thrombosis of her right lower extremity.  Patient is also status post right hip arthroplasty May 2019.  In August 2021 she had a CT scan of angiogram of the chest for right upper quadrant pain.  The was found at that time a new right lung pleural thickening which in August on PET CT scan showed hypermetabolic activity of the right lower lobe mass with adenopathy in the azygos esophageal recess and a low right paratracheal node with equivocal SUV activity.  She underwent a CT-guided fine-needle aspiration of the right lung mass showing non-small cell cancer favoring squamous cell carcinoma.  MRI of her brain showed no evidence to suggest metastatic disease.  She has been seen by medical oncology and recommendation for concurrent chemoradiation was made.  She is scheduled to start [place] with concurrent radiation.  She is seen today feels somewhat weak has a slightly productive cough had 1 episode of trace hemoptysis.  PLANNED TREATMENT REGIMEN: Concurrent chemoradiation  PAST MEDICAL HISTORY:  has a past medical history of Acute postoperative pain (02/03/2018), Anemia, B12 deficiency,  Bacteremia (10/07/2014), Bladder incontinence, Blind left eye, Cancer ([place]), Cataract, Cervical spine fracture ([place]), Chest pain (07/29/2013), Compression fracture, COPD (chronic obstructive pulmonary disease) ([place]), DDD (degenerative disc disease), lumbar, Depression, Diffuse myofascial pain syndrome (03/24/2015), Dyspnea, Fever (10/05/2014), GERD (gastroesophageal reflux disease), Hiatal hernia, Hyperlipidemia, Hypertension, Hypertensive kidney disease, malignant (11/07/2016), Macular degeneration, On home oxygen therapy, Osteoarthritis, Osteoporosis, Pneumonia, Pulmonary embolism ([place]) (04/30/2018), Reactive airway disease, Rupture of bowel ([place]), Sepsis ([place]) (10/08/2014), Stroke [facility]), Stroke ([place]), and Wrist pain, acute (09/10/2012).    PAST SURGICAL HISTORY:  Past Surgical History:  Procedure Laterality Date  . ABDOMINAL HYSTERECTOMY    . ABDOMINAL SURGERY    . APPENDECTOMY    . BLADDER SURGERY    . CATARACT EXTRACTION W/ INTRAOCULAR LENS  IMPLANT, BILATERAL Bilateral   . CHOLECYSTECTOMY    . COLON SURGERY    . COLOSTOMY REVERSAL    . ESOPHAGOGASTRODUODENOSCOPY N/A 10/30/2018   Procedure: ESOPHAGOGASTRODUODENOSCOPY (EGD);  Surgeon: Lin Landsman, MD;  Location[facility] ENDOSCOPY;  Service: Gastroenterology;  Laterality: N/A;  . INTRAMEDULLARY (IM) NAIL INTERTROCHANTERIC Right 09/13/2016   Procedure: INTRAMEDULLARY (IM) NAIL INTERTROCHANTRIC RIGHT;  Surgeon: Leandrew Koyanagi, MD;  Location: [place];  Service: Orthopedics;  Laterality: Right;  . JOINT REPLACEMENT Bilateral   . JOINT REPLACEMENT    . KNEE SURGERY    . OSTOMY    . RECTOCELE REPAIR    . TOE SURGERY Right    3rd  . TOTAL HIP ARTHROPLASTY Right 09/30/2017   Procedure: RIGHT HIP IM NAIL REMOVAL WITH CONVERSION TO TOTAL HIP ARTHOPLASTY,  anterior approach;  Surgeon: Renette Butters, MD;  Location: [place];  Service: Orthopedics;  Laterality: Right;    FAMILY HISTORY: family history includes Cervical cancer in her daughter; Deep vein  thrombosis in her sister and son; Heart attack in her father; Heart failure in her mother; Lung cancer in her sister; Pulmonary embolism in her son.  SOCIAL HISTORY:  reports that she quit smoking about 31 years ago. Her smoking use included cigarettes. She has a 30.00 pack-year smoking history. She has never used smokeless tobacco. She reports previous alcohol use. She reports previous drug use.  ALLERGIES: Ampicillin, Ampicillin-sulbactam sodium, Ambien [zolpidem tartrate], Cyclobenzaprine, Tape, Toradol [ketorolac tromethamine], Sulfamethoxazole-trimethoprim, Zolpidem, Cephalexin, and Tramadol  MEDICATIONS:  Current Outpatient Medications  Medication Sig Dispense Refill  . acetaminophen (TYLENOL) 500 MG tablet Take 2 tablets (1,000 mg total) by mouth in the morning, at noon, and at bedtime.    Marland Kitchen albuterol (ACCUNEB) 1.25 MG/3ML nebulizer solution Take 3 mLs (1.25 mg total) by nebulization every 6 (six) hours as needed for wheezing. 75 mL 1  . albuterol (VENTOLIN HFA) 108 (90 Base) MCG/ACT inhaler Inhale 2 puffs into the lungs every 6 (six) hours as needed for wheezing or shortness of breath. 1 Inhaler 2  . alendronate (FOSAMAX) 70 MG tablet TAKE 1 TABLET BY MOUTH ONCE A WEEK 12 tablet 1  . ALPRAZolam (XANAX) 1 MG tablet TAKE 0.5-1 TABLETS (0.5-1 MG TOTAL) BY MOUTH 2 (TWO) TIMES DAILY AS NEEDED FOR ANXIETY. 60 tablet 0  . amLODipine (NORVASC) 5 MG tablet TAKE 1 TABLET BY MOUTH EVERY DAY 90 tablet 1  . diclofenac Sodium (VOLTAREN) 1 % GEL Apply 4 g topically in the morning, at noon, and at bedtime. Around or in place of lidoderm patch 100 g 0  . docusate sodium (COLACE) 100 MG capsule Take 200 mg by mouth 2 (two) times daily as needed for moderate constipation.     . DULoxetine (CYMBALTA) 20 MG capsule TAKE 2 CAPSULES BY MOUTH EVERY DAY 180 capsule 0  . ELIQUIS 2.5 MG TABS tablet TAKE 1 TABLET BY MOUTH TWICE A DAY 60 tablet 3  . esomeprazole (NEXIUM) 40 MG capsule TAKE 1 CAPSULE BY MOUTH EVERY DAY  90 capsule 1  . fentaNYL (DURAGESIC) 25 MCG/HR Place 1 patch onto the skin every 3 (three) days.    Marland Kitchen FLOVENT HFA 220 MCG/ACT inhaler TAKE 1 PUFF BY MOUTH TWICE A DAY 24 Inhaler 5  . fluticasone (FLONASE) 50 MCG/ACT nasal spray SPRAY 2 SPRAYS INTO EACH NOSTRIL EVERY DAY 48 mL 1  . lidocaine-prilocaine (EMLA) cream Apply to affected area once 30 g 3  . LINZESS 290 MCG CAPS capsule TAKE 1 CAPSULE BY MOUTH EVERY DAY 90 capsule 1  . lovastatin (MEVACOR) 10 MG tablet TAKE 1 TABLET BY MOUTH EVERY DAY 90 tablet 0  . mirtazapine (REMERON) 7.5 MG tablet TAKE 1 TABLET (7.5 MG TOTAL) BY MOUTH AT BEDTIME. 90 tablet 1  . NARCAN 4 MG/0.1ML LIQD nasal spray kit     . ondansetron (ZOFRAN ODT) 4 MG disintegrating tablet Take 1 tablet (4 mg total) by mouth every 8 (eight) hours as needed for nausea or vomiting. 30 tablet 0  . orphenadrine (NORFLEX) 100 MG tablet 100 mg as needed.    . Oxycodone HCl 10 MG TABS Take 10 mg by mouth 3 (three) times daily.    . polyethylene glycol (MIRALAX / GLYCOLAX) packet Take 17 g by mouth daily as needed (for constipation.).     Marland Kitchen  pregabalin (LYRICA) 100 MG capsule Take 1 capsule (100 mg total) by mouth 2 (two) times daily. 180 capsule 0  . prochlorperazine (COMPAZINE) 10 MG tablet Take 1 tablet (10 mg total) by mouth every 6 (six) hours as needed (Nausea or vomiting). 30 tablet 1  . promethazine (PHENERGAN) 12.5 MG tablet Take 1 tablet (12.5 mg total) by mouth every 8 (eight) hours as needed for nausea or vomiting. 30 tablet 1  . traZODone (DESYREL) 50 MG tablet TAKE 0.5-1.5 TABLETS (25-75 MG TOTAL) BY MOUTH AT BEDTIME AS NEEDED FOR SLEEP. 135 tablet 1  . montelukast (SINGULAIR) 10 MG tablet Take 1 tablet (10 mg total) by mouth at bedtime. (Patient not taking: Reported on 03/06/2020) 21 tablet 0   No current facility-administered medications for this encounter.    ECOG PERFORMANCE STATUS:  1 - Symptomatic but completely ambulatory  REVIEW OF SYSTEMS: Patient denies any  weight loss, fatigue, weakness, fever, chills or night sweats. Patient denies any loss of vision, blurred vision. Patient denies any ringing  of the ears or hearing loss. No irregular heartbeat. Patient denies heart murmur or history of fainting. Patient denies any chest pain or pain radiating to her upper extremities. Patient denies any shortness of breath, difficulty breathing at night, cough or hemoptysis. Patient denies any swelling in the lower legs. Patient denies any nausea vomiting, vomiting of blood, or coffee ground material in the vomitus. Patient denies any stomach pain. Patient states has had normal bowel movements no significant constipation or diarrhea. Patient denies any dysuria, hematuria or significant nocturia. Patient denies any problems walking, swelling in the joints or loss of balance. Patient denies any skin changes, loss of hair or loss of weight. Patient denies any excessive worrying or anxiety or significant depression. Patient denies any problems with insomnia. Patient denies excessive thirst, polyuria, polydipsia. Patient denies any swollen glands, patient denies easy bruising or easy bleeding. Patient denies any recent infections, allergies or URI. Patient "s visual fields have not changed significantly in recent time.   PHYSICAL EXAM: BP (!) 163/110 (BP Location: Right Arm, Patient Position: Sitting, Cuff Size: Normal)   Pulse 97   Temp 97.6 F (36.4 C)   Resp 20   Wt 164 lb 9.6 oz (74.7 kg)   BMI 27.39 kg/m  Well-developed well-nourished patient in NAD. HEENT reveals PERLA, EOMI, discs not visualized.  Oral cavity is clear. No oral mucosal lesions are identified. Neck is clear without evidence of cervical or supraclavicular adenopathy. Lungs are clear to A&P. Cardiac examination is essentially unremarkable with regular rate and rhythm without murmur rub or thrill. Abdomen is benign with no organomegaly or masses noted. Motor sensory and DTR levels are equal and symmetric in  the upper and lower extremities. Cranial nerves II through XII are grossly intact. Proprioception is intact. No peripheral adenopathy or edema is identified. No motor or sensory levels are noted. Crude visual fields are within normal range.  LABORATORY DATA: Pathology report reviewed     RADIOLOGY RESULTS: CT scans PET CT scans and MRI of brain all reviewed compatible with above-stated findings   IMPRESSION: Stage III a probable squamous cell carcinoma the right lower lobe in 79 year old female  PLAN: Present time I have recommended concurrent chemoradiation.  With plan on IMRT radiation therapy to [place] the areas of hypermetabolic activity including the primary of the right lower lobe as well as mediastinal nodes.  I would choose IMRT to avoid critical structures such as the heart spinal cord and esophagus.  Risks and benefits of treatment including possible dysphagia fatigue alteration blood counts development of cough skin reaction all were discussed in detail with the patient.  She seems to comprehend my treatment plan well.  I have personally set up and ordered CT simulation for later this week.  We will coordinate her chemotherapy with medical oncology.  There will be extra effort by both professional staff as well as technical staff to coordinate and manage concurrent chemoradiation and ensuing side effects during her treatments.  I would like to take this opportunity to thank you for allowing me to participate in the care of your patient.Noreene Filbert, MD

## 2020-03-07 ENCOUNTER — Encounter: Payer: Self-pay | Admitting: *Deleted

## 2020-03-07 ENCOUNTER — Encounter: Payer: Self-pay | Admitting: Family Medicine

## 2020-03-07 ENCOUNTER — Telehealth: Payer: Self-pay

## 2020-03-07 NOTE — Telephone Encounter (Signed)
Done Pt has been sched for  lab/MD/ NEW Carbo/Taxol on 10/25.  as requested

## 2020-03-07 NOTE — Telephone Encounter (Signed)
Notified Patient's [name] of appts.

## 2020-03-07 NOTE — Progress Notes (Signed)
  Oncology Nurse Navigator Documentation  Navigator Location: [facility] On[facility] (03/07/20 1100)   )Navigator Encounter Type: Initial RadOnc (03/07/20 1100)                         Barriers/Navigation Needs: Coordination of Care (03/07/20 1100)   Interventions: Coordination of Care (03/07/20 1100)   Coordination of Care: Appts (03/07/20 1100)       met with patient and her daughter during initial consult with Dr. Baruch Gouty to discuss radiation treatment. All questions answered during visit. Pt informed that [facility] tried to call a few days ago to schedule her port placement. Contact info given to pt and her daughter to call [facility] to schedule port placement. Reviewed upcoming appts. Instructed to call with any further questions or needs. Pt and her daughter verbalized understanding. Nothing further needed at this time.           Time Spent with Patient: 60 (03/07/20 1100)

## 2020-03-07 NOTE — Telephone Encounter (Signed)
Patient is scheduled to start radaiton on 10/25.  Dr. Tasia Catchings would like for her to be scheduled for lab/MD/carbo/taxol *new*on 10/25.   [facility] has tried contacting her to schedule port a cath placement.  Nurse navigator Dundee R, gave the [facility] phone number to patient's daughter today.

## 2020-03-08 ENCOUNTER — Encounter: Payer: Self-pay | Admitting: *Deleted

## 2020-03-08 ENCOUNTER — Ambulatory Visit
Admission: RE | Admit: 2020-03-08 | Discharge: 2020-03-08 | Disposition: A | Discharge status: Home / Self Care | Payer: Medicare Other | Source: Ambulatory Visit | Attending: Radiation Oncology | Admitting: Radiation Oncology

## 2020-03-08 DIAGNOSIS — C771 Secondary and unspecified malignant neoplasm of intrathoracic lymph nodes: Secondary | ICD-10-CM | POA: Insufficient documentation

## 2020-03-08 DIAGNOSIS — C3431 Malignant neoplasm of lower lobe, right bronchus or lung: Secondary | ICD-10-CM | POA: Diagnosis not present

## 2020-03-08 DIAGNOSIS — Z87891 Personal history of nicotine dependence: Secondary | ICD-10-CM | POA: Diagnosis not present

## 2020-03-08 DIAGNOSIS — Z510 Encounter for antineoplastic radiation therapy: Secondary | ICD-10-CM | POA: Insufficient documentation

## 2020-03-08 NOTE — Telephone Encounter (Signed)
Spoke with patient's daughter in the office.  A letter for an emotional support animal must come from a psychiatrist, psychologist or therapist.  Discussed this with her and suggested she see if they can get help through her cancer center nurse navigator.

## 2020-03-09 ENCOUNTER — Ambulatory Visit: Payer: Medicare Other

## 2020-03-09 ENCOUNTER — Other Ambulatory Visit: Payer: Self-pay

## 2020-03-09 ENCOUNTER — Ambulatory Visit (INDEPENDENT_AMBULATORY_CARE_PROVIDER_SITE_OTHER): Payer: Medicare Other | Admitting: Family Medicine

## 2020-03-09 ENCOUNTER — Telehealth: Payer: Self-pay | Admitting: Family Medicine

## 2020-03-09 ENCOUNTER — Encounter: Payer: Self-pay | Admitting: Family Medicine

## 2020-03-09 VITALS — BP 122/80 | HR 119 | Temp 97.4°F | Wt 160.5 lb

## 2020-03-09 DIAGNOSIS — K5903 Drug induced constipation: Secondary | ICD-10-CM | POA: Diagnosis not present

## 2020-03-09 DIAGNOSIS — T402X5A Adverse effect of other opioids, initial encounter: Secondary | ICD-10-CM

## 2020-03-09 DIAGNOSIS — R350 Frequency of micturition: Secondary | ICD-10-CM | POA: Diagnosis not present

## 2020-03-09 LAB — POC URINALSYSI DIPSTICK (AUTOMATED)
Bilirubin, UA: NEGATIVE
Blood, UA: NEGATIVE
Glucose, UA: NEGATIVE
Ketones, UA: NEGATIVE
Leukocytes, UA: NEGATIVE
Nitrite, UA: NEGATIVE
Protein, UA: POSITIVE — AB
Spec Grav, UA: 1.01 (ref 1.010–1.025)
Urobilinogen, UA: 0.2 E.U./dL
pH, UA: 7 (ref 5.0–8.0)

## 2020-03-09 NOTE — Telephone Encounter (Signed)
Disregard I was able to get pt in with dr cody today

## 2020-03-09 NOTE — Assessment & Plan Note (Signed)
UA w/o signs of infection. Offered UCx though noted that it will likely be negative but they would like to do this. Will f/u UCx. Denies intake of bladder irritants but hx of rectocele s/p repair w/ complications and chronic constipation which is not worse as well as hx of urinary incontinence. Advised increased hydration and kegel exercises. If not improving - would recommend urology or pelvic floor rehab.

## 2020-03-09 NOTE — Patient Instructions (Signed)
Urinary urgency - Drink 1 more bottle of water per day - Monitor symptoms - if not improving -- could consider pelvic floor therapy or urology  Urinary Incontinence in Women  Risk Factors - Cannot change: age, pregnancy history, history of hysterectomy - Possible factors you can change: overall health, diuretic use, high impact exercise, weight   General Treatment  1) Appropriate Fluid intake - drink 50-70 oz daily, but spread out how the fluid is consumed 2) Constipation Management - make sure you get fiber in your diet and if constipated a stool softener 3) Weight loss (if overweight) 4) Pelvic Floor strengthening --  ---- Kegel Exercises (practice by holding urine during urination -- feel the pelvic floor muscle contraction). Perform 3 sets of 8-10 contractions working your way to holding each contraction for 10 seconds. Do for 15-20 weeks -----> If you have trouble doing this, try them laying down. Lay down on your back with your feet flat on the ground and knees bent.

## 2020-03-09 NOTE — Assessment & Plan Note (Signed)
Denies worsening of constipation and continues to take several medications - including linzess 290 mcg - continue this medication. Advised increase water intake and avoiding straining as this can worsen prolapse if present.

## 2020-03-09 NOTE — Telephone Encounter (Signed)
Noted  

## 2020-03-09 NOTE — Progress Notes (Signed)
Subjective:     [name] is a 79 y.o. female presenting for Urinary Frequency (with an odor x 5 days ) and Urinary Urgency     HPI  #Urinary Frequency - urgency - no dysuria - bathroom 3-4 times a day - typically just goes 2 times a day - does not drink water - drinks decaf tea - no alcohol - no spicy foods or citus - endorses some incontinence - which is chronic - no issues emptying the bladder  Review of Systems   Social History   Tobacco Use  Smoking Status Former Smoker   Packs/day: 1.00   Years: 30.00   Pack years: 30.00   Types: Cigarettes   Quit date: 1990   Years since quitting: 31.8  Smokeless Tobacco Never Used        Objective:    BP Readings from Last 3 Encounters:  03/09/20 122/80  03/06/20 (!) 163/110  02/25/20 122/86   Wt Readings from Last 3 Encounters:  03/09/20 160 lb 8 oz (72.8 kg)  03/06/20 164 lb 9.6 oz (74.7 kg)  02/25/20 161 lb 12.8 oz (73.4 kg)    BP 122/80    Pulse (!) 119    Temp (!) 97.4 F (36.3 C) (Temporal)    Wt 160 lb 8 oz (72.8 kg)    SpO2 96%    BMI 26.71 kg/m    Physical Exam Constitutional:      General: She is not in acute distress.    Appearance: She is well-developed. She is not diaphoretic.     Comments: Sitting in wheel chair  HENT:     Right Ear: External ear normal.     Left Ear: External ear normal.     Nose: Nose normal.  Eyes:     Conjunctiva/sclera: Conjunctivae normal.  Cardiovascular:     Rate and Rhythm: Normal rate and regular rhythm.     Heart sounds: No murmur heard.   Pulmonary:     Effort: Pulmonary effort is normal. No respiratory distress.     Breath sounds: Normal breath sounds. No wheezing.  Abdominal:     General: Abdomen is flat. Bowel sounds are normal. There is no distension.     Tenderness: There is no abdominal tenderness. There is no guarding.  Musculoskeletal:     Cervical back: Neck supple.  Skin:    General: Skin is warm and dry.     Capillary Refill:  Capillary refill takes less than 2 seconds.  Neurological:     Mental Status: She is alert. Mental status is at baseline.  Psychiatric:        Mood and Affect: Mood normal.        Behavior: Behavior normal.     UA: neg LE, neg nitrites, +protein      Assessment & Plan:   Problem List Items Addressed This Visit      Digestive   Opioid-induced constipation (OIC) (Chronic)    Denies worsening of constipation and continues to take several medications - including linzess 290 mcg - continue this medication. Advised increase water intake and avoiding straining as this can worsen prolapse if present.         Other   Urinary frequency - Primary    UA w/o signs of infection. Offered UCx though noted that it will likely be negative but they would like to do this. Will f/u UCx. Denies intake of bladder irritants but hx of rectocele s/p repair w/ complications and chronic  constipation which is not worse as well as hx of urinary incontinence. Advised increased hydration and kegel exercises. If not improving - would recommend urology or pelvic floor rehab.       Relevant Orders   POCT Urinalysis Dipstick (Automated) (Completed)   Urine Culture       Return if symptoms worsen or fail to improve.  Lesleigh Noe, MD  This visit occurred during the SARS-CoV-2 public health emergency.  Safety protocols were in place, including screening questions prior to the visit, additional usage of staff PPE, and extensive cleaning of exam room while observing appropriate contact time as indicated for disinfecting solutions.

## 2020-03-09 NOTE — Telephone Encounter (Signed)
[name] daughter  called wanting to get mom appointment for uti. You only have 15 min can I put her in one of those.  Her urine has oder /frequency and burning

## 2020-03-10 ENCOUNTER — Telehealth (INDEPENDENT_AMBULATORY_CARE_PROVIDER_SITE_OTHER): Payer: Self-pay

## 2020-03-10 DIAGNOSIS — M25561 Pain in right knee: Secondary | ICD-10-CM | POA: Diagnosis not present

## 2020-03-10 DIAGNOSIS — R202 Paresthesia of skin: Secondary | ICD-10-CM | POA: Diagnosis not present

## 2020-03-10 DIAGNOSIS — M25551 Pain in right hip: Secondary | ICD-10-CM | POA: Diagnosis not present

## 2020-03-10 DIAGNOSIS — G894 Chronic pain syndrome: Secondary | ICD-10-CM | POA: Diagnosis not present

## 2020-03-10 DIAGNOSIS — G8929 Other chronic pain: Secondary | ICD-10-CM | POA: Diagnosis not present

## 2020-03-10 DIAGNOSIS — Z79891 Long term (current) use of opiate analgesic: Secondary | ICD-10-CM | POA: Diagnosis not present

## 2020-03-10 LAB — URINE CULTURE
MICRO NUMBER:: 11072453
SPECIMEN QUALITY:: ADEQUATE

## 2020-03-10 NOTE — Telephone Encounter (Signed)
Spoke with the patient's daughter and the patient is scheduled for a port placement with Dr. Lucky Cowboy on 03/13/20 with a 12:45 pm arrival time to the [facility]. Covid testing on 03/10/20 between 8-1 pm at the [place]. Pre-procedure instructions were discussed.

## 2020-03-10 NOTE — Telephone Encounter (Signed)
Patient's daughter called back and requested that the appt be moved. The appt has been moved to 03/15/20 with Dr. Delana Meyer and a 10:00 am arrival time to the [facility]. Covid testing on 03/13/20 between 8-1 pm at the [place].

## 2020-03-13 ENCOUNTER — Ambulatory Visit: Payer: Medicare Other | Admitting: Family Medicine

## 2020-03-13 ENCOUNTER — Other Ambulatory Visit: Payer: Self-pay | Admitting: Family Medicine

## 2020-03-13 ENCOUNTER — Other Ambulatory Visit: Payer: Self-pay

## 2020-03-13 ENCOUNTER — Other Ambulatory Visit
Admission: RE | Admit: 2020-03-13 | Discharge: 2020-03-13 | Disposition: A | Discharge status: Home / Self Care | Payer: Medicare Other | Source: Ambulatory Visit | Attending: Vascular Surgery | Admitting: Vascular Surgery

## 2020-03-13 DIAGNOSIS — M79604 Pain in right leg: Secondary | ICD-10-CM

## 2020-03-13 DIAGNOSIS — G894 Chronic pain syndrome: Secondary | ICD-10-CM

## 2020-03-13 DIAGNOSIS — C3431 Malignant neoplasm of lower lobe, right bronchus or lung: Secondary | ICD-10-CM | POA: Diagnosis not present

## 2020-03-13 DIAGNOSIS — J849 Interstitial pulmonary disease, unspecified: Secondary | ICD-10-CM

## 2020-03-13 DIAGNOSIS — C771 Secondary and unspecified malignant neoplasm of intrathoracic lymph nodes: Secondary | ICD-10-CM | POA: Diagnosis not present

## 2020-03-13 DIAGNOSIS — Z20822 Contact with and (suspected) exposure to covid-19: Secondary | ICD-10-CM | POA: Insufficient documentation

## 2020-03-13 DIAGNOSIS — M5136 Other intervertebral disc degeneration, lumbar region: Secondary | ICD-10-CM

## 2020-03-13 DIAGNOSIS — Z01812 Encounter for preprocedural laboratory examination: Secondary | ICD-10-CM | POA: Diagnosis not present

## 2020-03-13 DIAGNOSIS — Z510 Encounter for antineoplastic radiation therapy: Secondary | ICD-10-CM | POA: Diagnosis not present

## 2020-03-13 DIAGNOSIS — C3491 Malignant neoplasm of unspecified part of right bronchus or lung: Secondary | ICD-10-CM

## 2020-03-13 DIAGNOSIS — Z87891 Personal history of nicotine dependence: Secondary | ICD-10-CM | POA: Diagnosis not present

## 2020-03-13 DIAGNOSIS — G8929 Other chronic pain: Secondary | ICD-10-CM

## 2020-03-13 LAB — SARS CORONAVIRUS 2 (TAT 6-24 HRS): SARS Coronavirus 2: NEGATIVE

## 2020-03-13 MED ORDER — DULOXETINE HCL 60 MG PO CPEP: 60.0000 mg | ORAL_CAPSULE | Freq: Every day | ORAL | 1 refills | Status: AC

## 2020-03-14 ENCOUNTER — Encounter: Payer: Self-pay | Admitting: Oncology

## 2020-03-14 ENCOUNTER — Other Ambulatory Visit (INDEPENDENT_AMBULATORY_CARE_PROVIDER_SITE_OTHER): Payer: Self-pay | Admitting: Nurse Practitioner

## 2020-03-15 ENCOUNTER — Encounter
Admission: RE | Disposition: A | Discharge status: Home / Self Care | Payer: Self-pay | Source: Home / Self Care | Attending: Vascular Surgery

## 2020-03-15 ENCOUNTER — Ambulatory Visit
Admission: RE | Admit: 2020-03-15 | Discharge: 2020-03-15 | Disposition: A | Discharge status: Home / Self Care | Payer: Medicare Other | Attending: Vascular Surgery | Admitting: Vascular Surgery

## 2020-03-15 ENCOUNTER — Other Ambulatory Visit: Payer: Self-pay

## 2020-03-15 ENCOUNTER — Ambulatory Visit: Payer: Medicare Other | Admitting: Family Medicine

## 2020-03-15 DIAGNOSIS — Z7901 Long term (current) use of anticoagulants: Secondary | ICD-10-CM | POA: Diagnosis not present

## 2020-03-15 DIAGNOSIS — Z8673 Personal history of transient ischemic attack (TIA), and cerebral infarction without residual deficits: Secondary | ICD-10-CM | POA: Diagnosis not present

## 2020-03-15 DIAGNOSIS — J449 Chronic obstructive pulmonary disease, unspecified: Secondary | ICD-10-CM | POA: Diagnosis not present

## 2020-03-15 DIAGNOSIS — Z7983 Long term (current) use of bisphosphonates: Secondary | ICD-10-CM | POA: Diagnosis not present

## 2020-03-15 DIAGNOSIS — Z801 Family history of malignant neoplasm of trachea, bronchus and lung: Secondary | ICD-10-CM | POA: Insufficient documentation

## 2020-03-15 DIAGNOSIS — E785 Hyperlipidemia, unspecified: Secondary | ICD-10-CM | POA: Diagnosis not present

## 2020-03-15 DIAGNOSIS — M810 Age-related osteoporosis without current pathological fracture: Secondary | ICD-10-CM | POA: Insufficient documentation

## 2020-03-15 DIAGNOSIS — Z7951 Long term (current) use of inhaled steroids: Secondary | ICD-10-CM | POA: Insufficient documentation

## 2020-03-15 DIAGNOSIS — N189 Chronic kidney disease, unspecified: Secondary | ICD-10-CM | POA: Diagnosis not present

## 2020-03-15 DIAGNOSIS — I129 Hypertensive chronic kidney disease with stage 1 through stage 4 chronic kidney disease, or unspecified chronic kidney disease: Secondary | ICD-10-CM | POA: Diagnosis not present

## 2020-03-15 DIAGNOSIS — C3490 Malignant neoplasm of unspecified part of unspecified bronchus or lung: Secondary | ICD-10-CM

## 2020-03-15 DIAGNOSIS — Z86711 Personal history of pulmonary embolism: Secondary | ICD-10-CM | POA: Diagnosis not present

## 2020-03-15 DIAGNOSIS — Z96641 Presence of right artificial hip joint: Secondary | ICD-10-CM | POA: Diagnosis not present

## 2020-03-15 DIAGNOSIS — C3491 Malignant neoplasm of unspecified part of right bronchus or lung: Secondary | ICD-10-CM | POA: Diagnosis not present

## 2020-03-15 DIAGNOSIS — Z9049 Acquired absence of other specified parts of digestive tract: Secondary | ICD-10-CM | POA: Insufficient documentation

## 2020-03-15 DIAGNOSIS — Z8049 Family history of malignant neoplasm of other genital organs: Secondary | ICD-10-CM | POA: Insufficient documentation

## 2020-03-15 DIAGNOSIS — Z880 Allergy status to penicillin: Secondary | ICD-10-CM | POA: Insufficient documentation

## 2020-03-15 DIAGNOSIS — F329 Major depressive disorder, single episode, unspecified: Secondary | ICD-10-CM | POA: Insufficient documentation

## 2020-03-15 DIAGNOSIS — K219 Gastro-esophageal reflux disease without esophagitis: Secondary | ICD-10-CM | POA: Diagnosis not present

## 2020-03-15 DIAGNOSIS — Z90710 Acquired absence of both cervix and uterus: Secondary | ICD-10-CM | POA: Diagnosis not present

## 2020-03-15 DIAGNOSIS — Z9981 Dependence on supplemental oxygen: Secondary | ICD-10-CM | POA: Diagnosis not present

## 2020-03-15 DIAGNOSIS — Z832 Family history of diseases of the blood and blood-forming organs and certain disorders involving the immune mechanism: Secondary | ICD-10-CM | POA: Insufficient documentation

## 2020-03-15 DIAGNOSIS — Z8249 Family history of ischemic heart disease and other diseases of the circulatory system: Secondary | ICD-10-CM | POA: Insufficient documentation

## 2020-03-15 DIAGNOSIS — H5462 Unqualified visual loss, left eye, normal vision right eye: Secondary | ICD-10-CM | POA: Diagnosis not present

## 2020-03-15 DIAGNOSIS — Z79899 Other long term (current) drug therapy: Secondary | ICD-10-CM | POA: Diagnosis not present

## 2020-03-15 DIAGNOSIS — Z888 Allergy status to other drugs, medicaments and biological substances status: Secondary | ICD-10-CM | POA: Insufficient documentation

## 2020-03-15 DIAGNOSIS — Z885 Allergy status to narcotic agent status: Secondary | ICD-10-CM | POA: Diagnosis not present

## 2020-03-15 DIAGNOSIS — Z881 Allergy status to other antibiotic agents status: Secondary | ICD-10-CM | POA: Diagnosis not present

## 2020-03-15 DIAGNOSIS — Z87891 Personal history of nicotine dependence: Secondary | ICD-10-CM | POA: Diagnosis not present

## 2020-03-15 HISTORY — PX: PORTA CATH INSERTION: CATH118285

## 2020-03-15 SURGERY — PORTA CATH INSERTION
Anesthesia: Moderate Sedation

## 2020-03-15 MED ORDER — MIDAZOLAM HCL 2 MG/ML PO SYRP: 8.0000 mg | ORAL_SOLUTION | Freq: Once | ORAL | Status: AC | PRN

## 2020-03-15 MED ORDER — SODIUM CHLORIDE 0.9 % IV SOLN
INTRAVENOUS | Status: DC
  Administered 2020-03-15: 200 mL via INTRAVENOUS

## 2020-03-15 MED ORDER — FENTANYL CITRATE (PF) 100 MCG/2ML IJ SOLN
INTRAMUSCULAR | Status: DC | PRN
Start: 2020-03-15 — End: 2020-03-15
  Administered 2020-03-15 (×2): 50 ug via INTRAVENOUS

## 2020-03-15 MED ORDER — ONDANSETRON HCL 4 MG/2ML IJ SOLN: 4.0000 mg | Freq: Four times a day (QID) | INTRAMUSCULAR | Status: DC | PRN

## 2020-03-15 MED ORDER — FENTANYL CITRATE (PF) 100 MCG/2ML IJ SOLN
INTRAMUSCULAR | Status: AC
  Filled 2020-03-15: qty 2

## 2020-03-15 MED ORDER — MIDAZOLAM HCL 2 MG/ML PO SYRP
ORAL_SOLUTION | ORAL | Status: AC
  Administered 2020-03-15: 8 mg via ORAL
  Filled 2020-03-15: qty 4

## 2020-03-15 MED ORDER — SODIUM CHLORIDE 0.9 % IV SOLN
Freq: Once | INTRAVENOUS | Status: DC
  Filled 2020-03-15: qty 2

## 2020-03-15 MED ORDER — METHYLPREDNISOLONE SODIUM SUCC 125 MG IJ SOLR: 125.0000 mg | Freq: Once | INTRAMUSCULAR | Status: DC | PRN

## 2020-03-15 MED ORDER — MIDAZOLAM HCL 5 MG/5ML IJ SOLN
INTRAMUSCULAR | Status: AC
  Filled 2020-03-15: qty 5

## 2020-03-15 MED ORDER — FAMOTIDINE 20 MG PO TABS: 40.0000 mg | ORAL_TABLET | Freq: Once | ORAL | Status: DC | PRN

## 2020-03-15 MED ORDER — DIPHENHYDRAMINE HCL 50 MG/ML IJ SOLN: 50.0000 mg | Freq: Once | INTRAMUSCULAR | Status: DC | PRN

## 2020-03-15 MED ORDER — FENTANYL CITRATE (PF) 100 MCG/2ML IJ SOLN: 12.5000 ug | Freq: Once | INTRAMUSCULAR | Status: DC | PRN

## 2020-03-15 MED ORDER — CLINDAMYCIN PHOSPHATE 300 MG/50ML IV SOLN
300.0000 mg | Freq: Once | INTRAVENOUS | Status: AC
  Administered 2020-03-15: 300 mg via INTRAVENOUS

## 2020-03-15 MED ORDER — CLINDAMYCIN PHOSPHATE 300 MG/50ML IV SOLN
INTRAVENOUS | Status: AC
  Filled 2020-03-15: qty 50

## 2020-03-15 MED ORDER — MIDAZOLAM HCL 2 MG/2ML IJ SOLN
INTRAMUSCULAR | Status: DC | PRN
  Administered 2020-03-15: 2 mg via INTRAVENOUS
  Administered 2020-03-15: 0.5 mg via INTRAVENOUS

## 2020-03-15 MED ORDER — CHLORHEXIDINE GLUCONATE CLOTH 2 % EX PADS
6.0000 | MEDICATED_PAD | Freq: Every day | CUTANEOUS | Status: DC
  Administered 2020-03-15: 6 via TOPICAL

## 2020-03-15 SURGICAL SUPPLY — 15 items
COVER SURGICAL LIGHT HANDLE (MISCELLANEOUS) ×3 IMPLANT
DERMABOND ADVANCED (GAUZE/BANDAGES/DRESSINGS) ×2
DERMABOND ADVANCED .7 DNX12 (GAUZE/BANDAGES/DRESSINGS) ×1 IMPLANT
DRAPE INCISE IOBAN 66X45 STRL (DRAPES) ×3 IMPLANT
KIT PORT POWER 8FR ISP CVUE (Port) ×3 IMPLANT
NEEDLE ENTRY 21GA 7CM ECHOTIP (NEEDLE) ×3 IMPLANT
PACK ANGIOGRAPHY (CUSTOM PROCEDURE TRAY) ×3 IMPLANT
SET INTRO CAPELLA COAXIAL (SET/KITS/TRAYS/PACK) ×3 IMPLANT
SPONGE XRAY 4X4 16PLY STRL (MISCELLANEOUS) ×3 IMPLANT
SUT MNCRL 4-0 (SUTURE) ×3
SUT MNCRL 4-0 27XMFL (SUTURE) ×1
SUT VIC AB 3-0 CT1 27 (SUTURE) ×3
SUT VIC AB 3-0 CT1 TAPERPNT 27 (SUTURE) ×1 IMPLANT
SUTURE MNCRL 4-0 27XMF (SUTURE) ×1 IMPLANT
TOWEL OR 17X26 4PK STRL BLUE (TOWEL DISPOSABLE) ×3 IMPLANT

## 2020-03-15 NOTE — Discharge Instructions (Signed)
Implanted [facility] Guide An implanted port is a device that is placed under the skin. It is usually placed in the chest. The device can be used to give IV medicine, to take blood, or for dialysis. You may have an implanted port if:  You need IV medicine that would be irritating to the small veins in your hands or arms.  You need IV medicines, such as antibiotics, for a long period of time.  You need IV nutrition for a long period of time.  You need dialysis. Having a port means that your health care provider will not need to use the veins in your arms for these procedures. You may have fewer limitations when using a port than you would if you used other types of long-term IVs, and you will likely be able to return to normal activities after your incision heals. An implanted port has two main parts:  Reservoir. The reservoir is the part where a needle is inserted to give medicines or draw blood. The reservoir is round. After it is placed, it appears as a small, raised area under your skin.  Catheter. The catheter is a thin, flexible tube that connects the reservoir to a vein. Medicine that is inserted into the reservoir goes into the catheter and then into the vein. How is my port accessed? To access your port:  A numbing cream may be placed on the skin over the port site.  Your health care provider will put on a mask and sterile gloves.  The skin over your port will be cleaned carefully with a germ-killing soap and allowed to dry.  Your health care provider will gently pinch the port and insert a needle into it.  Your health care provider will check for a blood return to make sure the port is in the vein and is not clogged.  If your port needs to remain accessed to get medicine continuously (constant infusion), your health care provider will place a clear bandage (dressing) over the needle site. The dressing and needle will need to be changed every week, or as told by your health care  provider. What is flushing? Flushing helps keep the port from getting clogged. Follow instructions from your health care provider about how and when to flush the port. Ports are usually flushed with saline solution or a medicine called heparin. The need for flushing will depend on how the port is used:  If the port is only used from time to time to give medicines or draw blood, the port may need to be flushed: ? Before and after medicines have been given. ? Before and after blood has been drawn. ? As part of routine maintenance. Flushing may be recommended every 4-6 weeks.  If a constant infusion is running, the port may not need to be flushed.  Throw away any syringes in a disposal container that is meant for sharp items (sharps container). You can buy a sharps container from a pharmacy, or you can make one by using an empty hard plastic bottle with a cover. How long will my port stay implanted? The port can stay in for as long as your health care provider thinks it is needed. When it is time for the port to come out, a surgery will be done to remove it. The surgery will be similar to the procedure that was done to put the port in. Follow these instructions at home:   Flush your port as told by your health care provider.  If you need an infusion over several days, follow instructions from your health care provider about how to take care of your port site. Make sure you: ? Wash your hands with soap and water before you change your dressing. If soap and water are not available, use alcohol-based hand sanitizer. ? Change your dressing as told by your health care provider. ? Place any used dressings or infusion bags into a plastic bag. Throw that bag in the trash. ? Keep the dressing that covers the needle clean and dry. Do not get it wet. ? Do not use scissors or sharp objects near the tube. ? Keep the tube clamped, unless it is being used.  Check your port site every day for signs of  infection. Check for: ? Redness, swelling, or pain. ? Fluid or blood. ? Pus or a bad smell.  Protect the skin around the port site. ? Avoid wearing bra straps that rub or irritate the site. ? Protect the skin around your port from seat belts. Place a soft pad over your chest if needed.  Bathe or shower as told by your health care provider. The site may get wet as long as you are not actively receiving an infusion.  Return to your normal activities as told by your health care provider. Ask your health care provider what activities are safe for you.  Carry a medical alert card or wear a medical alert bracelet at all times. This will let health care providers know that you have an implanted port in case of an emergency. Get help right away if:  You have redness, swelling, or pain at the port site.  You have fluid or blood coming from your port site.  You have pus or a bad smell coming from the port site.  You have a fever. Summary  Implanted ports are usually placed in the chest for long-term IV access.  Follow instructions from your health care provider about flushing the port and changing bandages (dressings).  Take care of the area around your port by avoiding clothing that puts pressure on the area, and by watching for signs of infection.  Protect the skin around your port from seat belts. Place a soft pad over your chest if needed.  Get help right away if you have a fever or you have redness, swelling, pain, drainage, or a bad smell at the port site. This information is not intended to replace advice given to you by your health care provider. Make sure you discuss any questions you have with your health care provider. Document Revised: 09/04/2018 Document Reviewed: 06/15/2016 Elsevier Patient Education  [place].   Moderate Conscious Sedation, Adult, Care After These instructions provide you with information about caring for yourself after your procedure. Your  health care provider may also give you more specific instructions. Your treatment has been planned according to current medical practices, but problems sometimes occur. Call your health care provider if you have any problems or questions after your procedure. What can I expect after the procedure? After your procedure, it is common:  To feel sleepy for several hours.  To feel clumsy and have poor balance for several hours.  To have poor judgment for several hours.  To vomit if you eat too soon. Follow these instructions at home: For at least 24 hours after the procedure:   Do not: ? Participate in activities where you could fall or become injured. ? Drive. ? Use heavy machinery. ? Drink alcohol. ?  Take sleeping pills or medicines that cause drowsiness. ? Make important decisions or sign legal documents. ? Take care of children on your own.  Rest. Eating and drinking  Follow the diet recommended by your health care provider.  If you vomit: ? Drink water, juice, or soup when you can drink without vomiting. ? Make sure you have little or no nausea before eating solid foods. General instructions  Have a responsible adult stay with you until you are awake and alert.  Take over-the-counter and prescription medicines only as told by your health care provider.  If you smoke, do not smoke without supervision.  Keep all follow-up visits as told by your health care provider. This is important. Contact a health care provider if:  You keep feeling nauseous or you keep vomiting.  You feel light-headed.  You develop a rash.  You have a fever. Get help right away if:  You have trouble breathing. This information is not intended to replace advice given to you by your health care provider. Make sure you discuss any questions you have with your health care provider. Document Revised: 04/25/2017 Document Reviewed: 09/02/2015 Elsevier Patient Education  2020 [employer].

## 2020-03-15 NOTE — Telephone Encounter (Signed)
Results scanned in chart 

## 2020-03-15 NOTE — Progress Notes (Signed)
Daughter to bedside. Pt attempting to eat.

## 2020-03-15 NOTE — Op Note (Addendum)
OPERATIVE NOTE   PROCEDURE: 1. Placement of a right IJ Infuse-a-Port  PRE-OPERATIVE DIAGNOSIS: Lung carcinoma  POST-OPERATIVE DIAGNOSIS: Same  SURGEON: Katha Cabal M.D.  ANESTHESIA: Conscious sedation was administered under my direct supervision by the interventional radiology RN. IV Versed plus fentanyl were utilized. Continuous ECG, pulse oximetry and blood pressure was monitored throughout the entire procedure. Conscious sedation was for a total of 38 minutes.  ESTIMATED BLOOD LOSS: Minimal   FINDING(S): 1.  Patent vein  SPECIMEN(S): None  INDICATIONS:   [name] is a 79 y.o. female who presents with lung carcinoma.  She is on parenteral chemotherapy and requires appropriate intravenous access.  Infuse-a-Port has been recommended.  Risks and benefits were reviewed all questions were answered patient agrees to proceed.  DESCRIPTION: After obtaining full informed written consent, the patient was brought back to the special procedure suite and placed in the supine position. The patient's right neck and chest wall are prepped and draped in sterile fashion. Appropriate timeout was called.  Ultrasound is placed in a sterile sleeve, ultrasound is utilized to avoid vascular injury as well as secondary to lack of appropriate landmarks. The right internal jugular vein is identified. It is echolucent and homogeneous as well as easily compressible indicating patency. An image is recorded for the permanent record.  Access to the vein with a micropuncture needle is done under direct ultrasound visualization.  1% lidocaine is infiltrated into the soft tissue at the base of the neck as well as on the chest wall.  Under direct ultrasound visualization a micro-needle is inserted into the vein followed by the micro-wire. Micro-sheath was then advanced and a J wire is inserted without difficulty under fluoroscopic guidance. A small counterincision was created at the wire insertion site. A  transverse incision is created 2 fingerbreadths below the scapula and a pocket is fashioned using both blunt and sharp dissection. The pocket is tested for appropriate size with the hub of the Infuse-a-Port. The tunneling device is then used to pull the intravascular portion of the catheter from the pocket to the neck counterincision.  Dilator and peel-away sheath were then inserted over the wire and the wire is removed. Catheter is then advanced into the venous system without difficulty. Peel-away sheath was then removed.  Catheter is then positioned under fluoroscopic guidance at the atrial caval junction. It is then transected connected to the hub and the hope is slipped into the subcutaneous pocket on the chest wall. The hub was then accessed percutaneously and aspirates easily and flushes well and is flushed with 30 cc of heparinized saline. The pocket incision is then closed in layers using interrupted 3-0 Vicryl for the subcutaneous tissues and 4-0 Monocryl subcuticular for skin closure. Dermabond is applied. The neck counterincision was closed with 4-0 Monocryl subcuticular and Dermabond as well.  The patient tolerated the procedure well and there were no immediate complications.  COMPLICATIONS: None  CONDITION: Unchanged  Katha Cabal M.D. [facility] Office: [phone]   03/15/2020, 1:48 PM

## 2020-03-15 NOTE — Interval H&P Note (Signed)
History and Physical Interval Note:  03/15/2020 11:25 AM  [name]  has presented today for surgery, with the diagnosis of Port Placement   Squamous cell carinoma of RT lung   Pt to have Covid test on 03-13-20.  The various methods of treatment have been discussed with the patient and family. After consideration of risks, benefits and other options for treatment, the patient has consented to  Procedure(s): PORTA CATH INSERTION (N/A) as a surgical intervention.  The patient's history has been reviewed, patient examined, no change in status, stable for surgery.  I have reviewed the patient's chart and labs.  Questions were answered to the patient's satisfaction.     Hortencia Pilar

## 2020-03-15 NOTE — Progress Notes (Signed)
Pt is hypotensive. Pt given a 300 ml bolus per Dr Delana Meyer.

## 2020-03-16 ENCOUNTER — Other Ambulatory Visit: Payer: Self-pay | Admitting: Family Medicine

## 2020-03-16 ENCOUNTER — Inpatient Hospital Stay: Payer: Medicare Other

## 2020-03-16 ENCOUNTER — Telehealth (INDEPENDENT_AMBULATORY_CARE_PROVIDER_SITE_OTHER): Payer: Self-pay

## 2020-03-16 ENCOUNTER — Ambulatory Visit: Admission: RE | Admit: 2020-03-16 | Payer: Medicare Other | Source: Ambulatory Visit

## 2020-03-16 ENCOUNTER — Other Ambulatory Visit: Payer: Self-pay | Admitting: *Deleted

## 2020-03-16 ENCOUNTER — Encounter: Payer: Self-pay | Admitting: Vascular Surgery

## 2020-03-16 NOTE — Telephone Encounter (Signed)
Patient's daughter called wanting to know if its normal to have pain after having a port placed. I advised that it is normal. The daughter stated she was on pain management and that medicine is not touching her pain.  I advised that it is normal to  have some pain after a port placement so to take Ibuprofen or Tylenol if able to do so. The daughter also asked about a numbing cream I advised she contact the Cancer center regarding this cream.

## 2020-03-16 NOTE — Telephone Encounter (Signed)
Hassan Rowan- this is Dr.Yu's pt.  GB

## 2020-03-17 ENCOUNTER — Inpatient Hospital Stay: Payer: Medicare Other

## 2020-03-17 ENCOUNTER — Other Ambulatory Visit: Payer: Self-pay | Admitting: Oncology

## 2020-03-20 ENCOUNTER — Inpatient Hospital Stay: Payer: Medicare Other

## 2020-03-20 ENCOUNTER — Inpatient Hospital Stay (HOSPITAL_BASED_OUTPATIENT_CLINIC_OR_DEPARTMENT_OTHER): Payer: Medicare Other | Admitting: Oncology

## 2020-03-20 ENCOUNTER — Other Ambulatory Visit: Payer: Self-pay

## 2020-03-20 ENCOUNTER — Encounter: Payer: Self-pay | Admitting: Oncology

## 2020-03-20 ENCOUNTER — Ambulatory Visit
Admission: RE | Admit: 2020-03-20 | Discharge: 2020-03-20 | Disposition: A | Discharge status: Home / Self Care | Payer: Medicare Other | Source: Ambulatory Visit | Attending: Radiation Oncology | Admitting: Radiation Oncology

## 2020-03-20 VITALS — BP 118/84 | HR 92 | Temp 97.5°F | Resp 16 | Wt 157.5 lb

## 2020-03-20 DIAGNOSIS — C3431 Malignant neoplasm of lower lobe, right bronchus or lung: Secondary | ICD-10-CM | POA: Diagnosis not present

## 2020-03-20 DIAGNOSIS — C3491 Malignant neoplasm of unspecified part of right bronchus or lung: Secondary | ICD-10-CM

## 2020-03-20 DIAGNOSIS — Z86718 Personal history of other venous thrombosis and embolism: Secondary | ICD-10-CM

## 2020-03-20 DIAGNOSIS — Z87891 Personal history of nicotine dependence: Secondary | ICD-10-CM | POA: Diagnosis not present

## 2020-03-20 DIAGNOSIS — Z510 Encounter for antineoplastic radiation therapy: Secondary | ICD-10-CM | POA: Diagnosis not present

## 2020-03-20 DIAGNOSIS — C771 Secondary and unspecified malignant neoplasm of intrathoracic lymph nodes: Secondary | ICD-10-CM | POA: Diagnosis not present

## 2020-03-20 DIAGNOSIS — R918 Other nonspecific abnormal finding of lung field: Secondary | ICD-10-CM | POA: Diagnosis not present

## 2020-03-20 DIAGNOSIS — E876 Hypokalemia: Secondary | ICD-10-CM

## 2020-03-20 DIAGNOSIS — R112 Nausea with vomiting, unspecified: Secondary | ICD-10-CM

## 2020-03-20 DIAGNOSIS — F419 Anxiety disorder, unspecified: Secondary | ICD-10-CM

## 2020-03-20 DIAGNOSIS — D649 Anemia, unspecified: Secondary | ICD-10-CM | POA: Diagnosis not present

## 2020-03-20 DIAGNOSIS — Z86711 Personal history of pulmonary embolism: Secondary | ICD-10-CM

## 2020-03-20 DIAGNOSIS — D509 Iron deficiency anemia, unspecified: Secondary | ICD-10-CM | POA: Diagnosis not present

## 2020-03-20 LAB — CBC WITH DIFFERENTIAL/PLATELET
Abs Immature Granulocytes: 0.03 10*3/uL (ref 0.00–0.07)
Basophils Absolute: 0.1 10*3/uL (ref 0.0–0.1)
Basophils Relative: 1 %
Eosinophils Absolute: 0.2 10*3/uL (ref 0.0–0.5)
Eosinophils Relative: 2 %
HCT: 34.7 % — ABNORMAL LOW (ref 36.0–46.0)
Hemoglobin: 10.7 g/dL — ABNORMAL LOW (ref 12.0–15.0)
Immature Granulocytes: 0 %
Lymphocytes Relative: 20 %
Lymphs Abs: 2 10*3/uL (ref 0.7–4.0)
MCH: 25.6 pg — ABNORMAL LOW (ref 26.0–34.0)
MCHC: 30.8 g/dL (ref 30.0–36.0)
MCV: 83 fL (ref 80.0–100.0)
Monocytes Absolute: 1.3 10*3/uL — ABNORMAL HIGH (ref 0.1–1.0)
Monocytes Relative: 13 %
Neutro Abs: 6.3 10*3/uL (ref 1.7–7.7)
Neutrophils Relative %: 64 %
Platelets: 352 10*3/uL (ref 150–400)
RBC: 4.18 MIL/uL (ref 3.87–5.11)
RDW: 18.7 % — ABNORMAL HIGH (ref 11.5–15.5)
WBC: 9.8 10*3/uL (ref 4.0–10.5)
nRBC: 0 % (ref 0.0–0.2)

## 2020-03-20 LAB — COMPREHENSIVE METABOLIC PANEL
ALT: 35 U/L (ref 0–44)
AST: 64 U/L — ABNORMAL HIGH (ref 15–41)
Albumin: 2.8 g/dL — ABNORMAL LOW (ref 3.5–5.0)
Alkaline Phosphatase: 151 U/L — ABNORMAL HIGH (ref 38–126)
Anion gap: 9 (ref 5–15)
BUN: 7 mg/dL — ABNORMAL LOW (ref 8–23)
CO2: 28 mmol/L (ref 22–32)
Calcium: 9.1 mg/dL (ref 8.9–10.3)
Chloride: 101 mmol/L (ref 98–111)
Creatinine, Ser: 1.16 mg/dL — ABNORMAL HIGH (ref 0.44–1.00)
GFR, Estimated: 48 mL/min — ABNORMAL LOW (ref >60)
Glucose, Bld: 97 mg/dL (ref 70–99)
Potassium: 2.9 mmol/L — ABNORMAL LOW (ref 3.5–5.1)
Sodium: 138 mmol/L (ref 135–145)
Total Bilirubin: 0.5 mg/dL (ref 0.3–1.2)
Total Protein: 8.1 g/dL (ref 6.5–8.1)

## 2020-03-20 MED ORDER — POTASSIUM CHLORIDE ER 10 MEQ PO TBCR: 10.0000 meq | EXTENDED_RELEASE_TABLET | Freq: Every day | ORAL | 0 refills | Status: DC

## 2020-03-20 MED ORDER — LIDOCAINE-PRILOCAINE 2.5-2.5 % EX CREA: 1.0000 "application " | TOPICAL_CREAM | CUTANEOUS | 1 refills | Status: AC | PRN

## 2020-03-20 MED ORDER — POTASSIUM CHLORIDE IN NACL 20-0.9 MEQ/L-% IV SOLN
Freq: Once | INTRAVENOUS | Status: AC
  Filled 2020-03-20: qty 1000

## 2020-03-20 MED ORDER — HEPARIN SOD (PORK) LOCK FLUSH 100 UNIT/ML IV SOLN
500.0000 [IU] | Freq: Once | INTRAVENOUS | Status: AC
  Administered 2020-03-20: 500 [IU] via INTRAVENOUS
  Filled 2020-03-20: qty 5

## 2020-03-20 NOTE — Progress Notes (Signed)
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  Past medical history of stroke, PE, GERD, HTN, HLD, anxiety, Vit B 12 deficiency, COPD, depression.  Patient receiving concurrent chemotherapy and radiation therapy.   Met with patient and daughter following MD appointment.  No treatment today but receiving IV fluids.  Patient reports poor appetite and taste alterations.  Daughter reports breakfast is usually grits and eggs.  Yesterday ate brunswick stew for lunch yesterday and drank boost shake with ice cream for dinner.  Likes hot dogs with mustard.     Medications:  Remeron, MVI, zofran, compazine, linzess, colace   Labs: reviewed   Anthropometrics:   Height: 65 inches Weight: 157 lb today 180 09/03/19 BMI: 25 13% weight loss in the last 6 months, concerning  NUTRITION DIAGNOSIS: Inadequate oral intake related to cancer related treatment side effects as evidenced by  13% weight loss in the last 6 months, decreased intake     INTERVENTION:  Discussed strategies for taste alterations.  Handout provided Discussed ways to increase calories and protein Contact information provided.   Provided samples of boost plus and coupons   MONITORING, EVALUATION, GOAL: weight trends, intake   Next Visit: Nov 29 after radiation  [name] B. Zenia Resides, [place], [place] Registered Dietitian [phone] (mobile)

## 2020-03-20 NOTE — Progress Notes (Signed)
Patient is accompanied by her daughter today.  She has an increase in nausea, change in taste, increased cough in the evening.

## 2020-03-20 NOTE — Progress Notes (Signed)
Hematology/Oncology follow up note [facility] [phone] [phone]   Patient Care Team: [name], FNP as PCP - General (Nurse Practitioner) Kate Sable, MD as PCP - Cardiology (Cardiology) [name], FNP (Nurse Practitioner) Telford Nab, RN as Oncology Nurse Navigator  REFERRING PROVIDER: [name], FNP  CHIEF COMPLAINTS/REASON FOR VISIT:  Follow-up for history of thrombosis, anticoagulation, and lung cancer  HISTORY OF PRESENTING ILLNESS:  [name] is a  79 y.o.  female with PMH listed below who was referred to me for evaluation of pulmonary embolism.  PE was diagnosed 04/30/2018, CT chest angiogram showed pulmonary embolism with right heart strain..  05/02/2018 US duplex lower extremities showed  Right: age indeterminate deep vein thrombosis involving the right posterior tibial vein, and right peroneal vein.  Left: acute deep vein thrombosis involving the left posterior tibial vein, and left peroneal vein.  She is on Eliquis 50m BID currently.  PE and DVT events occurred shortly after her hospitalization due to pneumonia.  Patient has chronic ambulation difficulty after her right hip arthroplasty  in May 2019. She subsequently had several complications immediately postoperative of fecal impaction and ileus.  This was done after an intertrochanteric hip fracture on the right side.She has had multiple ER visits over the past several months.  Also complains chronic lower extremities "soreness" for several month.     # 01/03/2020 patient had a CT chest angiogram done for evaluation of right upper quadrant and flank pain Study showed no acute PE.  Chronic pulmonary fibrosis with a new 3 cm rounded opacity in the superior segment of right lower lobe.  Superimposed new right lung pleural thickening. 01/25/2020, PET scan showed right lower lobe mass with SUV max of 18.5.  Hypermetabolic adenopathy within the  azygos esophageal recess, SUV of 16.2.  A low right paratracheal node with equivocal SUV activity. She presented emergency room on 01/31/2020 due to flank pain.  Had another CT chest angiogram done which showed no pulmonary embolism.   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  There was no metabolic activity on recent PET scan, favoring ampullary stenosis.  # 02/21/2020 S/p CT guided biopsy of  RLL mass.  Pathology showed  NSCLC, favor squamous cell carcinoma.   INTERVAL HISTORY Nicole DRAYis a 79y.o. female who has above history reviewed by me today presents for follow up visit for management lung cancer management.  Problems and complaints are listed below: Patient was accompanied by her daughter. She has chronic history of anxiety and is on Xanax, Cymbalta.  More nausea and vomiting episodes than her baseline. She takes antiemetics.  Reports medal taste, increased fatigue.  Some cough, chronic SOB no change.    Review of Systems  Constitutional: Positive for fatigue. Negative for appetite change, chills and fever.  HENT:   Negative for hearing loss and voice change.   Eyes: Negative for eye problems.  Respiratory: Positive for cough and shortness of breath. Negative for chest tightness.   Cardiovascular: Negative for chest pain.  Gastrointestinal: Positive for nausea. Negative for abdominal distention, abdominal pain and blood in stool.  Endocrine: Negative for hot flashes.  Genitourinary: Negative for difficulty urinating and frequency.        Right hip pain  Musculoskeletal: Positive for arthralgias.  Skin: Negative  for itching and rash.  Neurological: Negative for extremity weakness.  Hematological: Negative for adenopathy.  Psychiatric/Behavioral: Negative for  confusion.    MEDICAL HISTORY:  Past Medical History:  Diagnosis Date  . Acute postoperative pain 02/03/2018  . Anemia   . B12 deficiency   . Bacteremia 10/07/2014  . Bladder incontinence   . Blind left eye   . Cancer ([place])    skin cancer   . Cataract   . Cervical spine fracture ([place])   . Chest pain 07/29/2013  . Compression fracture    C7,  upper T spine -compression fx  . COPD (chronic obstructive pulmonary disease) ([place])   . DDD (degenerative disc disease), lumbar   . Depression    major  . Diffuse myofascial pain syndrome 03/24/2015  . Dyspnea    at times- when activty  . Fever 10/05/2014  . GERD (gastroesophageal reflux disease)   . Hiatal hernia   . Hyperlipidemia   . Hypertension   . 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 Macular degeneration   . On home oxygen therapy    "3L; all the time" (04/30/2018)  . Osteoarthritis   . Osteoporosis   . Pneumonia    years ago  . Pulmonary embolism ([place]) 04/30/2018  . Reactive airway disease   . Rupture of bowel ([employer])   . Sepsis ([place]) 10/08/2014  . Stroke [facility])    "mini stroke"- balance and memory issue  . Stroke ([place])   . Wrist pain, acute 09/10/2012    SURGICAL HISTORY: Past Surgical History:  Procedure Laterality Date  . ABDOMINAL HYSTERECTOMY    . ABDOMINAL SURGERY    . APPENDECTOMY    . BLADDER SURGERY    . CATARACT EXTRACTION W/ INTRAOCULAR LENS  IMPLANT, BILATERAL Bilateral   . CHOLECYSTECTOMY    . COLON SURGERY    . COLOSTOMY REVERSAL    . ESOPHAGOGASTRODUODENOSCOPY N/A 10/30/2018   Procedure: ESOPHAGOGASTRODUODENOSCOPY (EGD);  Surgeon: Lin Landsman, MD;  [facility] ENDOSCOPY;  Service: Gastroenterology;  Laterality: N/A;  . INTRAMEDULLARY (IM) NAIL INTERTROCHANTERIC Right 09/13/2016   Procedure: INTRAMEDULLARY (IM) NAIL INTERTROCHANTRIC RIGHT;  Surgeon: Leandrew Koyanagi, MD;  Location: [place];  Service: Orthopedics;  Laterality: Right;  . JOINT REPLACEMENT Bilateral   . JOINT REPLACEMENT     . KNEE SURGERY    . OSTOMY    . PORTA CATH INSERTION N/A 03/15/2020   Procedure: PORTA CATH INSERTION;  Surgeon: Katha Cabal, MD;  Location: [place] [facility];  Service: Cardiovascular;  Laterality: N/A;  . RECTOCELE REPAIR    . TOE SURGERY Right    3rd  . TOTAL HIP ARTHROPLASTY Right 09/30/2017   Procedure: RIGHT HIP IM NAIL REMOVAL WITH CONVERSION TO TOTAL HIP ARTHOPLASTY, anterior approach;  Surgeon: Renette Butters, MD;  Location: [place];  Service: Orthopedics;  Laterality: Right;    SOCIAL HISTORY: Social History   Socioeconomic History  . Marital status: Widowed    Spouse name: Not on file  . Number of children: 3  . Years of education: middle sch  . Highest education level: Not on file  Occupational History  . Occupation: retired    Comment: n/a  Tobacco Use  . Smoking status: Former Smoker    Packs/day: 1.00    Years: 30.00    Pack years: 30.00    Types: Cigarettes    Quit date: 1990    Years since quitting: 31.8  . Smokeless tobacco: Never Used  Vaping Use  .  Vaping Use: Former  Substance and Sexual Activity  . Alcohol use: Not Currently  . Drug use: Not Currently  . Sexual activity: Not Currently  Other Topics Concern  . Not on file  Social History Narrative   ** Merged History Encounter **       ** Merged History Encounter **       Patient lives at home with daughter. Caffeine Use: 4 cups daily   Social Determinants of Health   Financial Resource Strain: Low Risk   . Difficulty of Paying Living Expenses: Not hard at all  Food Insecurity: No Food Insecurity  . Worried About Charity fundraiser in the Last Year: Never true  . Ran Out of Food in the Last Year: Never true  Transportation Needs: No Transportation Needs  . Lack of Transportation (Medical): No  . Lack of Transportation (Non-Medical): No  Physical Activity: Inactive  . Days of Exercise per Week: 0 days  . Minutes of Exercise per Session: 0 min  Stress: Stress Concern Present   . Feeling of Stress : Very much  Social Connections:   . Frequency of Communication with Friends and Family: Not on file  . Frequency of Social Gatherings with Friends and Family: Not on file  . Attends Religious Services: Not on file  . Active Member of Clubs or Organizations: Not on file  . Attends Archivist Meetings: Not on file  . Marital Status: Not on file  Intimate Partner Violence: Not At Risk  . Fear of Current or Ex-Partner: No  . Emotionally Abused: No  . Physically Abused: No  . Sexually Abused: No    FAMILY HISTORY: Family History  Problem Relation Age of Onset  . Heart failure Mother   . Heart attack Father   . Lung cancer Sister   . Deep vein thrombosis Sister   . Pulmonary embolism Son   . Deep vein thrombosis Son   . Cervical cancer Daughter     ALLERGIES:  is allergic to ampicillin, ampicillin-sulbactam sodium, ambien [zolpidem tartrate], cyclobenzaprine, tape, toradol [ketorolac tromethamine], sulfamethoxazole-trimethoprim, cephalexin, and tramadol.  MEDICATIONS:  Current Outpatient Medications  Medication Sig Dispense Refill  . acetaminophen (TYLENOL) 500 MG tablet Take 2 tablets (1,000 mg total) by mouth in the morning, at noon, and at bedtime.    Marland Kitchen albuterol (ACCUNEB) 1.25 MG/3ML nebulizer solution Take 3 mLs (1.25 mg total) by nebulization every 6 (six) hours as needed for wheezing. 75 mL 1  . albuterol (VENTOLIN HFA) 108 (90 Base) MCG/ACT inhaler Inhale 2 puffs into the lungs every 6 (six) hours as needed for wheezing or shortness of breath. 1 Inhaler 2  . alendronate (FOSAMAX) 70 MG tablet TAKE 1 TABLET BY MOUTH ONCE A WEEK (Patient taking differently: Take 70 mg by mouth every Sunday. ) 12 tablet 1  . ALPRAZolam (XANAX) 1 MG tablet TAKE 0.5-1 TABLETS (0.5-1 MG TOTAL) BY MOUTH 2 (TWO) TIMES DAILY AS NEEDED FOR ANXIETY. 60 tablet 0  . amLODipine (NORVASC) 5 MG tablet TAKE 1 TABLET BY MOUTH EVERY DAY (Patient taking differently: Take 5 mg by  mouth daily. ) 90 tablet 1  . Cyanocobalamin (VITAMIN B-12) 2500 MCG SUBL Take 2,500 mcg by mouth every other day.     . docusate sodium (COLACE) 100 MG capsule Take 100 mg by mouth daily.     . DULoxetine (CYMBALTA) 60 MG capsule Take 1 capsule (60 mg total) by mouth daily. 90 capsule 1  . ELIQUIS 2.5 MG TABS  tablet TAKE 1 TABLET BY MOUTH TWICE A DAY (Patient taking differently: Take 2.5 mg by mouth in the morning and at bedtime. ) 60 tablet 3  . esomeprazole (NEXIUM) 40 MG capsule TAKE 1 CAPSULE BY MOUTH EVERY DAY 90 capsule 1  . fentaNYL (DURAGESIC) 25 MCG/HR Place 1 patch onto the skin every 3 (three) days.    . Ferrous Gluconate (IRON 27 PO) Take 27 mg by mouth 2 (two) times a week.    Marland Kitchen FLOVENT HFA 220 MCG/ACT inhaler TAKE 1 PUFF BY MOUTH TWICE A DAY (Patient taking differently: Inhale 1 puff into the lungs in the morning and at bedtime. ) 24 Inhaler 5  . fluticasone (FLONASE) 50 MCG/ACT nasal spray SPRAY 2 SPRAYS INTO EACH NOSTRIL EVERY DAY (Patient taking differently: Place 2 sprays into both nostrils daily. ) 48 mL 1  . LINZESS 290 MCG CAPS capsule TAKE 1 CAPSULE BY MOUTH EVERY DAY (Patient taking differently: Take 290 mcg by mouth daily. ) 90 capsule 1  . lovastatin (MEVACOR) 10 MG tablet TAKE 1 TABLET BY MOUTH EVERY DAY (Patient taking differently: Take 10 mg by mouth daily. ) 90 tablet 0  . mirtazapine (REMERON) 7.5 MG tablet TAKE 1 TABLET (7.5 MG TOTAL) BY MOUTH AT BEDTIME. 90 tablet 1  . montelukast (SINGULAIR) 10 MG tablet TAKE 1 TABLET BY MOUTH EVERYDAY AT BEDTIME 21 tablet 0  . Multiple Minerals-Vitamins (CAL MAG ZINC +D3 PO) Take 1 tablet by mouth daily.    Marland Kitchen NARCAN 4 MG/0.1ML LIQD nasal spray kit Place 1 spray into the nose daily as needed (accidental overdose).     . ondansetron (ZOFRAN ODT) 4 MG disintegrating tablet Take 1 tablet (4 mg total) by mouth every 8 (eight) hours as needed for nausea or vomiting. 30 tablet 0  . orphenadrine (NORFLEX) 100 MG tablet Take 100 mg by mouth  2 (two) times daily as needed for muscle spasms. 1/4 every 2 days    . Oxycodone HCl 10 MG TABS Take 10 mg by mouth every 8 (eight) hours as needed (pain.).     Marland Kitchen polyethylene glycol (MIRALAX / GLYCOLAX) packet Take 17 g by mouth daily.     . pregabalin (LYRICA) 100 MG capsule Take 1 capsule (100 mg total) by mouth 2 (two) times daily. 180 capsule 0  . promethazine (PHENERGAN) 12.5 MG tablet Take 1 tablet (12.5 mg total) by mouth every 8 (eight) hours as needed for nausea or vomiting. 30 tablet 1  . lidocaine-prilocaine (EMLA) cream Apply 1 application topically as needed. Apply to port and cover with saran wrap 1-2 hours prior to port access (Patient not taking: Reported on 03/20/2020) 30 g 1  . potassium chloride (KLOR-CON) 10 MEQ tablet Take 1 tablet (10 mEq total) by mouth daily. 7 tablet 0  . prochlorperazine (COMPAZINE) 10 MG tablet Take 1 tablet (10 mg total) by mouth every 6 (six) hours as needed (Nausea or vomiting). (Patient not taking: Reported on 03/13/2020) 30 tablet 1   No current facility-administered medications for this visit.     PHYSICAL EXAMINATION: ECOG PERFORMANCE STATUS: 2 - Symptomatic, <50% confined to bed Vitals:   03/20/20 0930  BP: 118/84  Pulse: 92  Resp: 16  Temp: (!) 97.5 F (36.4 C)   Filed Weights   03/20/20 0930  Weight: 157 lb 8 oz (71.4 kg)    Physical Exam Constitutional:      General: She is not in acute distress.    Comments: Sits in wheelchair  Eyes:  General: No scleral icterus.    Pupils: Pupils are equal, round, and reactive to light.  Cardiovascular:     Rate and Rhythm: Normal rate and regular rhythm.     Heart sounds: Normal heart sounds.  Pulmonary:     Effort: Pulmonary effort is normal. No respiratory distress.     Breath sounds: No wheezing.     Comments: Bibasilar crackles Abdominal:     General: Bowel sounds are normal. There is no distension.     Palpations: Abdomen is soft. There is no mass.     Tenderness: There is  no abdominal tenderness.  Musculoskeletal:        General: Swelling present. No deformity. Normal range of motion.     Cervical back: Normal range of motion and neck supple.  Skin:    General: Skin is warm and dry.     Findings: No erythema or rash.  Neurological:     Mental Status: She is alert and oriented to person, place, and time.     Cranial Nerves: No cranial nerve deficit.     Coordination: Coordination normal.  Psychiatric:     Comments: anxious      LABORATORY DATA:  I have reviewed the data as listed Lab Results  Component Value Date   WBC 9.8 03/20/2020   HGB 10.7 (L) 03/20/2020   HCT 34.7 (L) 03/20/2020   MCV 83.0 03/20/2020   PLT 352 03/20/2020   Recent Labs    01/31/20 1155 01/31/20 1155 02/01/20 0947 02/01/20 0947 02/03/20 2050 03/06/20 1412 03/20/20 0831  NA 140   < > 140   < > 139 142 138  K 2.9*   < > 3.1*   < > 3.1* 3.6 2.9*  CL 102   < > 104   < > 101 103 101  CO2 30   < > 28   < > '26 27 28  ' GLUCOSE 110*   < > 124*   < > 111* 139* 97  BUN 15   < > 15   < > 14 9 7*  CREATININE 0.90   < > 0.78   < > 0.96 0.98 1.16*  CALCIUM 9.3   < > 9.2   < > 9.3 9.2 9.1  GFRNONAA >60   < > >60   < > 57* 55* 48*  GFRAA >60  --  >60  --  >60  --   --   PROT 7.6   < > 7.8  --  7.6  --  8.1  ALBUMIN 2.9*   < > 3.0*  --  2.8*  --  2.8*  AST 72*   < > 37  --  25  --  64*  ALT 24   < > 20  --  16  --  35  ALKPHOS 129*   < > 131*  --  103  --  151*  BILITOT 0.4   < > 0.4  --  0.4  --  0.5  BILIDIR <0.1  --   --   --  <0.1  --   --   IBILI NOT CALCULATED  --   --   --  NOT CALCULATED  --   --    < > = values in this interval not displayed.   Iron/TIBC/Ferritin/ %Sat    Component Value Date/Time   IRON 49 02/01/2020 0947   TIBC 326 02/01/2020 0947   FERRITIN 11 02/01/2020 0947   IRONPCTSAT 15 02/01/2020  0947      RADIOGRAPHIC STUDIES: I have personally reviewed the radiological images as listed and agreed with the findings in the report. DG Chest 1  View  Result Date: 02/03/2020 CLINICAL DATA:  Right-sided chest pain. Recent lung cancer diagnosis. EXAM: CHEST  1 VIEW COMPARISON:  Radiograph and CT 3 days ago 01/31/2020 FINDINGS: Stable heart size and mediastinal contours. Rounded density projecting over the medial right lung base corresponds to lung mass on CT. No acute airspace disease. No developing pleural effusion. No pneumothorax. Chronic interstitial coarsening. Stable osseous structures. IMPRESSION: 1. Stable radiographic appearance of the chest. 2. Rounded density projecting over the medial right lung base corresponds to lung mass on CT. Electronically Signed   By: Keith Rake M.D.   On: 02/03/2020 21:14   DG Chest 2 View  Result Date: 01/31/2020 CLINICAL DATA:  Right-sided chest pain. EXAM: CHEST - 2 VIEW COMPARISON:  Chest CT January 03, 2020 FINDINGS: Cardiomediastinal silhouette is normal. Mediastinal contours appear intact. Tortuosity of the aorta. Right lower lobe lung mass is not changed radiographically. Coarsening of the interstitium. Osseous structures are without acute abnormality. Soft tissues are grossly normal. IMPRESSION: 1. Right lower lobe lung mass is not changed radiographically. 2. Coarsening of the interstitium. Electronically Signed   By: Fidela Salisbury M.D.   On: 01/31/2020 12:28   DG Chest 2 View  Result Date: 01/02/2020 CLINICAL DATA:  Lower right chest pain. EXAM: CHEST - 2 VIEW COMPARISON:  July 15, 2019 FINDINGS: Mild, diffuse chronic appearing increased interstitial lung markings are noted. This is slightly more prominent within the bilateral lower lobes. There is no evidence of acute infiltrate, pleural effusion or pneumothorax. The cardiac silhouette is mildly enlarged. There is tortuosity of the descending thoracic aorta. The visualized skeletal structures are unremarkable. IMPRESSION: Chronic appearing increased interstitial lung markings without evidence of acute or active cardiopulmonary disease.  Electronically Signed   By: Virgina Norfolk M.D.   On: 01/02/2020 22:18   DG Ribs Unilateral W/Chest Right  Result Date: 01/17/2020 CLINICAL DATA:  Right rib pain EXAM: RIGHT RIBS AND CHEST - 3+ VIEW COMPARISON:  01/02/2020 FINDINGS: Heart is normal size. Calcified granuloma in the left upper lobe. Diffuse interstitial prominence within the lungs, stable. No effusions or pneumothorax. No acute bony abnormality. No visible rib fracture. IMPRESSION: No visible displaced rib fracture. Diffuse interstitial prominence within the lungs likely reflects chronic interstitial lung disease. Electronically Signed   By: Rolm Baptise M.D.   On: 01/17/2020 22:47   CT Angio Chest PE W and/or Wo Contrast  Result Date: 01/31/2020 CLINICAL DATA:  Lung cancer, chest pain EXAM: CT ANGIOGRAPHY CHEST WITH CONTRAST TECHNIQUE: Multidetector CT imaging of the chest was performed using the standard protocol during bolus administration of intravenous contrast. Multiplanar CT image reconstructions and MIPs were obtained to evaluate the vascular anatomy. CONTRAST:  54m OMNIPAQUE IOHEXOL 350 MG/ML SOLN COMPARISON:  01/03/2020 FINDINGS: Cardiovascular: Excellent opacification of the pulmonary arterial tree. No intraluminal filling defect identified to suggest acute pulmonary embolism. The central pulmonary arteries are mildly enlarged in keeping with changes of pulmonary arterial hypertension, stable since prior examination. Cardiac size is within normal limits. No pericardial effusion. The thoracic aorta is of normal caliber with mild atherosclerotic calcification noted within its descending segment. Mediastinum/Nodes: Pathologic as ago esophageal recess node is again identified, stable at 13 mm x 16 mm at axial image 40/3 shotty right paratracheal and precarinal adenopathy is again identified without frank pathologic enlargement. Thyroid unremarkable. Esophagus unremarkable.  Lungs/Pleura: The pleural based right posterior basal lower  lobe pulmonary mass is again identified and is unchanged measuring 2.7 x 3.9 cm in greatest dimension at axial image # 47/5. Central necrosis with a small amount of intraluminal gas is again identified. The mass abuts the pleural margin, however, there is no definite extension into the chest wall itself. No pneumothorax or pleural effusion. Mild subpleural pulmonary fibrosis is again identified diffusely. No new focal pulmonary infiltrate. Central airways are widely patent. Upper Abdomen: Multiple simple cortical cysts are seen within the left kidney. There is surgical changes of cholecystectomy identified with moderate to severe extrahepatic biliary ductal dilation. 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 This may represent bulbous distension of the ampulla into the duodenal lumen as can be seen with ampullary stenosis or a primary neoplasm of the ampulla. The lack of significant metabolic activity on prior PET CT examination favors the former. Musculoskeletal: No acute bone abnormality. Review of the MIP images confirms the above findings. IMPRESSION: 1. No evidence of acute pulmonary embolism. 2. Stable size and appearance of right lower lobe pulmonary mass and associated pathologic azygoesophageal recess node. 3. Moderate to severe extrahepatic biliary ductal dilation with an ampullary mass better identified on the current examination. This may represent bulbous distension of the ampulla into the duodenal lumen as can be seen with ampullary stenosis or a primary neoplasm of the ampulla. The lack of significant metabolic activity on prior PET CT examination favors the former. Correlation with endoscopy would be helpful for further evaluation. Aortic Atherosclerosis (ICD10-I70.0). Electronically Signed   By: Fidela Salisbury MD   On: 01/31/2020 16:30   CT Angio Chest PE W and/or Wo Contrast  Result  Date: 01/03/2020 CLINICAL DATA:  79 year old female with chest and abdominal pain. Right upper quadrant and flank pain since 0800 hours. EXAM: CT ANGIOGRAPHY CHEST WITH CONTRAST TECHNIQUE: Multidetector CT imaging of the chest was performed using the standard protocol during bolus administration of intravenous contrast. Multiplanar CT image reconstructions and MIPs were obtained to evaluate the vascular anatomy. CONTRAST:  140m OMNIPAQUE IOHEXOL 350 MG/ML SOLN COMPARISON:  CTA chest 05/17/2018. CT Abdomen and Pelvis today reported separately. FINDINGS: Cardiovascular: Excellent contrast bolus timing in the pulmonary arterial tree. No focal filling defect identified in the pulmonary arteries to suggest acute pulmonary embolism. Calcified coronary artery atherosclerosis. Cardiac size remains within normal limits. No pericardial effusion. Negative visible aorta aside from atherosclerosis. Mediastinum/Nodes: Stable mediastinal lymph nodes since 2019, such as due to chronic or recurrent pulmonary inflammation. Lungs/Pleura: Lower lung volumes. Chronic pulmonary fibrosis. Major airways remain patent. New right lung pleural thickening or trace pleural effusion with superimposed rounded roughly 3 cm superior segment lower lobe opacity with some internal gas or cavitation (series 4, image 60), new since 2019. But stable lung opacity elsewhere. Upper Abdomen: Negative visible liver, spleen (calcified granulomas), pancreas, adrenal glands, kidneys and bowel in the upper abdomen. Musculoskeletal: Multilevel upper thoracic and lower cervical compression fractures appear stable. No acute osseous abnormality identified. Review of the MIP images confirms the above findings. IMPRESSION: 1. No evidence of acute pulmonary embolus. 2. Chronic Pulmonary Fibrosis with a new 3 cm rounded opacity in the superior segment right lower lobe since 2019. Superimposed new right lung pleural thickening or trace pleural fluid. This is indeterminate  for pneumonia versus mass. Recommend one of the following: (a) repeat chest CT in 3 months, (b)  follow-up PET-CT, or (c) tissue sampling. This recommendation adapted from the consensus statement: Guidelines for Management of Incidental Pulmonary Nodules Detected on CT Images: From the Fleischner Society 2017; Radiology 2017; 284:228-243. 3.  CT Abdomen, and Pelvis today are reported separately. 4. Calcified coronary artery atherosclerosis. Aortic Atherosclerosis (ICD10-I70.0). Electronically Signed   By: Genevie Ann M.D.   On: 01/03/2020 00:58   MR Brain W Wo Contrast  Result Date: 03/05/2020 CLINICAL DATA:  New diagnosis squamous cell carcinoma of the lung. Staging. EXAM: MRI HEAD WITHOUT AND WITH CONTRAST TECHNIQUE: Multiplanar, multiecho pulse sequences of the brain and surrounding structures were obtained without and with intravenous contrast. CONTRAST:  7.40m GADAVIST GADOBUTROL 1 MMOL/ML IV SOLN COMPARISON:  Head CT 08/28/2019.  Brain MRI 06/11/2013. FINDINGS: Brain: Diffusion imaging does not show any acute or subacute infarction. Mild chronic small-vessel change affects the pons. 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 Mild chronic small-vessel ischemic changes of the cerebral hemispheric deep white matter. No cortical or large vessel territory infarction. After contrast administration, no abnormal enhancement occurs. No sign of metastatic disease. Vascular: Major vessels at the base of the brain show flow. Skull and upper cervical spine: Negative Sinuses/Orbits: Clear/normal Other: None IMPRESSION: 1. No evidence of metastatic disease. 2. Mild chronic small-vessel ischemic changes as outlined above. Electronically Signed   By: MNelson ChimesM.D.   On: 03/05/2020 05:48   CT ABDOMEN PELVIS W CONTRAST  Result Date: 01/03/2020 CLINICAL DATA:  79year old female with chest and abdominal pain. Right upper quadrant and flank pain since 0800 hours. EXAM: CT ABDOMEN AND PELVIS WITH CONTRAST TECHNIQUE:  Multidetector CT imaging of the abdomen and pelvis was performed using the standard protocol following bolus administration of intravenous contrast. CONTRAST:  1073mOMNIPAQUE IOHEXOL 350 MG/ML SOLN COMPARISON:  CTA chest today reported separately. CT Abdomen and Pelvis 06/18/2019 FINDINGS: Lower chest: On these images there is evidence of both pleural thickening with enhancement and intervening trace pleural fluid in the posterior right hemithorax (series 5, image 2), new since January. Some of the rounded right lower lobe superior segment opacity is also visible on these images, see chest CTA for additional details. Hepatobiliary: Chronically absent gallbladder. Intra and extrahepatic biliary ductal dilatation with CBD measuring up to 15 mm diameter, although this appears similar to the January CT. However, the CBD has enlarged since 2019 (up to 12 mm at that time. No filling defect identified within the duct. Liver enhancement remains within normal limits. Pancreas: Stable, negative.  No pancreatic ductal enlargement. Spleen: Stable calcified splenic granulomas. Adrenals/Urinary Tract: Normal adrenal glands. Both kidneys appear stable since 2019 with symmetric renal enhancement and contrast excretion. Normal proximal ureters. 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 There is abundant gas within the urinary bladder on series 5, image 79. Mild bladder wall thickening appears stable. No perivesical stranding is evident. Stomach/Bowel: Redundant large bowel with mild retained stool throughout. Diverticulosis of the sigmoid colon with no active inflammation evident. Decompressed terminal ileum. Diminutive or absent appendix. No dilated small bowel. Decompressed stomach and duodenum. No free air, free fluid. Vascular/Lymphatic: Aortoiliac calcified atherosclerosis. Major arterial structures remain patent. Portal venous system is patent.  No lymphadenopathy. Reproductive: Diminutive or absent as before. Other: Streak artifact in the pelvis, no pelvis free fluid. Musculoskeletal: Osteopenia. Chronic right hip arthroplasty. No acute osseous abnormality identified. IMPRESSION: 1. On these images there is evidence of pleural thickening and  trace pleural fluid in the right lower lobe, new since January this year, and near the abnormal superior segment opacity described on Chest CTA today. This would seem to favor infection over tumor. 2. Chronic but increased intra- and extrahepatic biliary ductal dilatation. No filling defect in the CBD. If there is hyperbilirubinemia then follow-up MRCP and/or ERCP may be valuable. 3. Gas within the urinary bladder, suspicious for UTI unless explained by recent catheterization. 4. Other chronic findings but no other acute or inflammatory process in the abdomen or pelvis. Electronically Signed   By: Genevie Ann M.D.   On: 01/03/2020 01:10   PERIPHERAL VASCULAR CATHETERIZATION  Result Date: 03/15/2020 See Op Note  NM PET Image Initial (PI) Skull Base To Thigh  Result Date: 01/25/2020 CLINICAL DATA:  Initial treatment strategy for chest CT demonstrating right lower lobe pneumonia versus mass. EXAM: NUCLEAR MEDICINE PET SKULL BASE TO THIGH TECHNIQUE: 8.9 mCi F-18 FDG was injected intravenously. Full-ring PET imaging was performed from the skull base to thigh after the radiotracer. CT data was obtained and used for attenuation correction and anatomic localization. Fasting blood glucose: 89 mg/dl COMPARISON:  CTA chest 01/03/2020.  Abdominopelvic CT 01/03/2020. FINDINGS: Mediastinal blood pool activity: SUV max 2.0 Liver activity: SUV max NA NECK: No areas of abnormal hypermetabolism. Incidental CT findings: No cervical adenopathy. Bilateral carotid atherosclerosis. CHEST: Pleural-based right lower lobe partially cavitary lung mass persists. 3.3 cm and a S.U.V. max of 18.5, including on 93/4. Hypermetabolic adenopathy  within the azygoesophageal recess. 1.4 cm and a S.U.V. max of 16.2 on 85/4. A low right paratracheal node measures 7 mm and a S.U.V. max of 2.7 on 81/4, equivocal. Incidental CT findings: Similar trace right pleural fluid or thickening. Cardiomegaly. Aortic and coronary artery atherosclerosis. Interstitial lung disease, suboptimally evaluated. Concurrent centrilobular emphysema. ABDOMEN/PELVIS: No abdominopelvic parenchymal or nodal hypermetabolism. Incidental CT findings: Deferred to recent diagnostic CT. 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 Abdominal aortic atherosclerosis. No acute superimposed process. Pelvic degradation secondary to right hip arthroplasty. SKELETON: No suspicious marrow hypermetabolism. Superficial to the proximal right femur is subcutaneous soft tissue thickening and mild hypermetabolism, including at a S.U.V. max of 3.4 on 256/4. Likely postoperative. Incidental CT findings: Osteopenia. Upper thoracic vertebral body height loss was present on 01/03/2020. IMPRESSION: 1. Persistent right lower lobe lung mass, consistent with primary bronchogenic carcinoma. 2. Hypermetabolic node within the azygoesophageal recess, consistent with nodal metastasis. A right paratracheal node is equivocal. 3. No evidence of hypermetabolic extrathoracic metastatic disease. Electronically Signed   By: Abigail Miyamoto M.D.   On: 01/25/2020 20:20   CT T-SPINE NO CHARGE  Result Date: 01/31/2020 CLINICAL DATA:  Lung cancer, right chest pain EXAM: CT THORACIC SPINE WITHOUT CONTRAST TECHNIQUE: Multidetector CT images of the thoracic were obtained using the standard protocol without intravenous contrast. COMPARISON:  None. Findings are correlated with PET CT examination of 01/25/2020 and concurrently performed CT arteriogram of the chest FINDINGS: Alignment: Normal thoracic kyphosis.  No listhesis. Vertebrae: Mild anterior wedge compression fracture T1 and T3 are identified with approximately 40% and 20% loss of  height, respectively. No acute fracture of the thoracic spine. There is no lytic or blastic bone lesion identified involving the vertebral bodies of the thoracic spine. There is, however, subtle erosion of the a right eighth rib just lateral to the a costovertebral junction, best seen on axial image # 67/11 and 67/10 in keeping with chest wall invasion and direct involvement of the a thoracic cage by the  primary mass within the posterior basal right lower lobe. Paraspinal and other soft tissues: Posterior basal right lower lobe pleural based central necrotic pulmonary mass again identified. Mild subpleural pulmonary fibrosis again noted. Pathologic as ago esophageal recess noted. Multiple simple cortical cysts are seen within the visualized left kidney. Disc levels: Intervertebral disc height has been preserved though there are discogenic changes noted predominantly at T5-T11 in keeping with changes of mild to moderate degenerative disc disease. Review of the axial images demonstrates no significant canal stenosis. No significant neural foraminal narrowing. IMPRESSION: 1. No lytic or blastic bone lesion identified involving the vertebral bodies of the thoracic spine. 2. Mild anterior wedge compression fractures of T1 and T3 are identified with approximately 40% and 20% loss of height, respectively. These appear chronic in nature. 3. No significant canal stenosis or neural foraminal narrowing is seen within the thoracic spine. 4. Posterior basal right lower lobe pleural based central necrotic pulmonary mass again identified. There is subtle erosion of the right eighth rib just lateral to the a costovertebral junction in keeping with chest wall invasion and direct involvement of the a thoracic cage by the primary mass within the posterior basal right lower lobe. Electronically Signed   By: Fidela Salisbury MD   On: 01/31/2020 16:41   CT BIOPSY  Result Date: 02/21/2020 CLINICAL DATA:  Persistent posterior right lower  lobe lung mass demonstrating increased metabolic activity by PET scan. The patient presents for CT-guided biopsy of the lung mass. EXAM: CT GUIDED CORE BIOPSY OF RIGHT LOWER LOBE LUNG MASS ANESTHESIA/SEDATION: 2.0 mg IV Versed; 100 mcg IV Fentanyl Total Moderate Sedation Time:  26 minutes. The patient's level of consciousness and physiologic status were continuously monitored during the procedure by Radiology nursing. PROCEDURE: The procedure risks, benefits, and alternatives were explained to the patient. Questions regarding the procedure were encouraged and answered. The patient understands and consents to the procedure. A time-out was performed prior to initiating the procedure. CT was performed through the chest in a prone position. The right posterior chest wall was prepped with chlorhexidine in a sterile fashion, and a sterile drape was applied covering the operative field. A sterile gown and sterile gloves were used for the procedure. Local anesthesia was provided with 1% Lidocaine. Under CT guidance, a 17 gauge trocar needle was advanced to the level of a posterior right lower lobe lung mass. After confirming needle tip position, 2 separate coaxial 18 gauge core biopsy samples were obtained and submitted in formalin. The BioSentry device was used in depositing a plug at the pleural entry site. Additional CT was performed after outer needle removal. COMPLICATIONS: None FINDINGS: Posterior subpleural right lower lobe lung mass again noted measuring approximately 3.0 x 3.9 cm in greatest transverse dimensions. Solid tissue was obtained. There was no evidence of regional hemorrhage or pneumothorax immediately following the procedure by CT. The patient will recover for at least 2 hours after the procedure. IMPRESSION: CT-guided core biopsy performed of a 3.9 cm right lower lobe subpleural mass. Electronically Signed   By: Aletta Edouard M.D.   On: 02/21/2020 12:46   ECHOCARDIOGRAM COMPLETE  Result Date:  02/15/2020    ECHOCARDIOGRAM REPORT   Patient Name:   [name] Date of Exam: 02/15/2020 Medical Rec #:  [id number]       Height:       65.0 in Accession #:    [id number]      Weight:       162.0 lb Date of  Birth:  01-14-1941        BSA:          1.809 m Patient Age:    79 years        BP:           138/84 mmHg Patient Gender: F               HR:           92 bpm. Exam Location:  [facility] Procedure: 2D Echo, Cardiac Doppler and Color Doppler Indications:    R06.02 SOB  History:        Patient has prior history of Echocardiogram examinations, most                 recent 05/02/2018. COPD, Arrythmias:Tachycardia,                 Signs/Symptoms:Chest Pain and Syncope; Risk                 Factors:Hypertension, Dyslipidemia and Non-Smoker. Anemia                 Interstitial lung disease.  Sonographer:    Pilar Jarvis RDMS, RVT, RDCS Referring Phys: [id number] [name]  Sonographer Comments: This was a poor exam due to several factors. Supine imaging was best but patient was in pain on her back. The patient was intolerant to all transducer pressure and had trouble executing a proper breath hold. Unknown acoustic interference possible from COPD or other lung condition IMPRESSIONS  1. Left ventricular ejection fraction, by estimation, is >55%. The left ventricle has normal function. Left ventricular endocardial border not optimally defined to evaluate regional wall motion. There is mild left ventricular hypertrophy. Left ventricular diastolic parameters are indeterminate.  2. Pulmonary artery pressure is at least upper normal to mildly elevated (PASP 30-35 mmHg plus central venous pressure). Right ventricular systolic function is normal. The right ventricular size is normal. Mildly increased right ventricular wall thickness.  3. The mitral valve is grossly normal. No evidence of mitral valve regurgitation.  4. The aortic valve has an indeterminant number of cusps. There is moderate calcification of the aortic  valve. There is moderate thickening of the aortic valve. Aortic valve regurgitation is not visualized. Mild to moderate aortic valve sclerosis/calcification is present, without any evidence of aortic stenosis.  5. Aortic dilatation noted. There is borderline dilatation of the ascending aorta, measuring 36 mm.  6. Mildly dilated pulmonary artery. FINDINGS  Left Ventricle: Left ventricular ejection fraction, by estimation, is >55%. The left ventricle has normal function. Left ventricular endocardial border not optimally defined to evaluate regional wall motion. The left ventricular internal cavity size was  normal in size. There is mild left ventricular hypertrophy. Left ventricular diastolic parameters are indeterminate. Right Ventricle: Pulmonary artery pressure is at least upper normal to mildly elevated (PASP 30-35 mmHg plus central venous pressure). The right ventricular size is normal. Mildly increased right ventricular wall thickness. Right ventricular systolic function is normal. Left Atrium: Left atrial size was normal in size. Right Atrium: Right atrial size was not well visualized. Pericardium: There is no evidence of pericardial effusion. Mitral Valve: The mitral valve is grossly normal. No evidence of mitral valve regurgitation. Tricuspid Valve: The tricuspid valve is not well visualized. Tricuspid valve regurgitation is trivial. Aortic Valve: The non-coronary leaflet appears fixed. The aortic valve has an indeterminant number of cusps. There is moderate calcification of the aortic valve. There is moderate thickening of the aortic  valve. Aortic valve regurgitation is not visualized. Mild to moderate aortic valve sclerosis/calcification is present, without any evidence of aortic stenosis. Aortic valve mean gradient measures 8.0 mmHg. Aortic valve peak gradient measures 12.7 mmHg. Aortic valve area, by VTI measures 2.11 cm. Pulmonic Valve: The pulmonic valve was normal in structure. Pulmonic valve  regurgitation is not visualized. No evidence of pulmonic stenosis. Aorta: Aortic dilatation noted. There is borderline dilatation of the ascending aorta, measuring 36 mm. Pulmonary Artery: The pulmonary artery is mildly dilated. Venous: The inferior vena cava was not well visualized. IAS/Shunts: The interatrial septum was not well visualized.  LEFT VENTRICLE PLAX 2D LVIDd:         4.20 cm  Diastology LVIDs:         3.10 cm  LV e' medial:    5.66 cm/s LV PW:         1.10 cm  LV E/e' medial:  11.1 LV IVS:        1.10 cm  LV e' lateral:   11.60 cm/s LVOT diam:     2.10 cm  LV E/e' lateral: 5.4 LV SV:         65 LV SV Index:   36 LVOT Area:     3.46 cm  RIGHT VENTRICLE             IVC RV S prime:     11.60 cm/s  IVC diam: 2.10 cm LEFT ATRIUM           Index LA diam:      3.50 cm 1.94 cm/m LA Vol (A2C): 54.3 ml 30.02 ml/m  AORTIC VALVE AV Area (Vmax):    1.79 cm AV Area (Vmean):   1.74 cm AV Area (VTI):     2.11 cm AV Vmax:           178.50 cm/s AV Vmean:          133.500 cm/s AV VTI:            0.308 m AV Peak Grad:      12.7 mmHg AV Mean Grad:      8.0 mmHg LVOT Vmax:         92.00 cm/s LVOT Vmean:        67.100 cm/s LVOT VTI:          0.188 m LVOT/AV VTI ratio: 0.61  AORTA Ao Root diam: 2.80 cm Ao Asc diam:  3.60 cm MITRAL VALVE                TRICUSPID VALVE MV Area (PHT): 2.53 cm     TR Peak grad:   31.4 mmHg MV Decel Time: 300 msec     TR Vmax:        280.00 cm/s MV E velocity: 62.60 cm/s MV A velocity: 102.00 cm/s  SHUNTS MV E/A ratio:  0.61         Systemic VTI:  0.19 m                             Systemic Diam: 2.10 cm Nelva Bush MD Electronically signed by Nelva Bush MD Signature Date/Time: 02/15/2020/5:09:07 PM    Final    DG Hip Unilat W OR W/O Pelvis 2-3 Views Right  Result Date: 01/17/2020 CLINICAL DATA:  Right hip pain EXAM: DG HIP (WITH OR WITHOUT PELVIS) 2-3V RIGHT COMPARISON:  09/07/2019 FINDINGS: Changes of right hip replacement. No hardware complicating feature. No  fracture,  subluxation or dislocation IMPRESSION: No acute bony abnormality. Electronically Signed   By: Rolm Baptise M.D.   On: 01/17/2020 22:55    ASSESSMENT & PLAN:  1. Squamous cell carcinoma of lung, right ([place])   2. History of pulmonary embolism   3. History of deep vein thrombosis of lower extremity   4. Normocytic anemia   5. Anxiety   6. Non-intractable vomiting with nausea, unspecified vomiting type   7. Hypokalemia   Cancer Staging Squamous cell carcinoma of lung, right ([facility]) Staging form: Lung, AJCC 8th Edition - Clinical: cT1, cN2, cM0 - Signed by Earlie Server, MD on 02/25/2020   # Squamous cell carcinoma of lung, Stage III Recommend Radiation +/- chemotherapy.  Hold chemotherapy today due to her symptoms.   # pre-existing nausea and vomiting, this is likely due to her underlying anxiety/mood disorders.  Continue anti-emetics, and anxiety medication.  1L of IV NS today  # Severe hypokalemia,  IV potassium chloride 43mq x 1 today.  Start oral potassium 14m daily. Rx sent.  Repeat BMP later this week.   # History of PE and DVT Continue Eliquis 2.5 mg twice daily for maintenance anticoagulation.  # Poor appetite/weight loss, refer to nutritionist.   # Chronic anemia, hemoglobin is 10.7, stable, monitor.  #Anxiety, anxiety seems to be worse since she knows the abnormal findings.  Continue Xanax and Cymbalta and Remeron..recommend her to continue follow up with primary care provider.    All questions were answered. The patient knows to call the clinic with any problems questions or concerns.  Return of visit: 1 week  ZhEarlie ServerMD, PhD Hematology Oncology [facility]0/25/2021

## 2020-03-21 ENCOUNTER — Ambulatory Visit
Admission: RE | Admit: 2020-03-21 | Discharge: 2020-03-21 | Disposition: A | Discharge status: Home / Self Care | Payer: Medicare Other | Source: Ambulatory Visit | Attending: Radiation Oncology | Admitting: Radiation Oncology

## 2020-03-21 ENCOUNTER — Inpatient Hospital Stay: Payer: Medicare Other

## 2020-03-21 DIAGNOSIS — Z510 Encounter for antineoplastic radiation therapy: Secondary | ICD-10-CM | POA: Diagnosis not present

## 2020-03-21 DIAGNOSIS — C3431 Malignant neoplasm of lower lobe, right bronchus or lung: Secondary | ICD-10-CM | POA: Diagnosis not present

## 2020-03-21 DIAGNOSIS — C771 Secondary and unspecified malignant neoplasm of intrathoracic lymph nodes: Secondary | ICD-10-CM | POA: Diagnosis not present

## 2020-03-22 ENCOUNTER — Inpatient Hospital Stay: Payer: Medicare Other

## 2020-03-22 ENCOUNTER — Ambulatory Visit
Admission: RE | Admit: 2020-03-22 | Discharge: 2020-03-22 | Disposition: A | Discharge status: Home / Self Care | Payer: Medicare Other | Source: Ambulatory Visit | Attending: Radiation Oncology | Admitting: Radiation Oncology

## 2020-03-22 DIAGNOSIS — C3431 Malignant neoplasm of lower lobe, right bronchus or lung: Secondary | ICD-10-CM | POA: Diagnosis not present

## 2020-03-22 DIAGNOSIS — C771 Secondary and unspecified malignant neoplasm of intrathoracic lymph nodes: Secondary | ICD-10-CM | POA: Diagnosis not present

## 2020-03-22 DIAGNOSIS — Z510 Encounter for antineoplastic radiation therapy: Secondary | ICD-10-CM | POA: Diagnosis not present

## 2020-03-23 ENCOUNTER — Telehealth: Payer: Self-pay | Admitting: *Deleted

## 2020-03-23 ENCOUNTER — Ambulatory Visit: Payer: Medicare Other

## 2020-03-23 ENCOUNTER — Inpatient Hospital Stay (HOSPITAL_BASED_OUTPATIENT_CLINIC_OR_DEPARTMENT_OTHER): Payer: Medicare Other | Admitting: Nurse Practitioner

## 2020-03-23 ENCOUNTER — Inpatient Hospital Stay: Payer: Medicare Other

## 2020-03-23 ENCOUNTER — Other Ambulatory Visit: Payer: Self-pay

## 2020-03-23 ENCOUNTER — Ambulatory Visit
Admission: RE | Admit: 2020-03-23 | Discharge: 2020-03-23 | Disposition: A | Discharge status: Home / Self Care | Payer: Medicare Other | Source: Ambulatory Visit | Attending: Radiation Oncology | Admitting: Radiation Oncology

## 2020-03-23 ENCOUNTER — Telehealth (INDEPENDENT_AMBULATORY_CARE_PROVIDER_SITE_OTHER): Payer: Self-pay

## 2020-03-23 VITALS — BP 135/87 | HR 95 | Temp 98.2°F | Resp 18

## 2020-03-23 DIAGNOSIS — C3491 Malignant neoplasm of unspecified part of right bronchus or lung: Secondary | ICD-10-CM

## 2020-03-23 DIAGNOSIS — C771 Secondary and unspecified malignant neoplasm of intrathoracic lymph nodes: Secondary | ICD-10-CM | POA: Diagnosis not present

## 2020-03-23 DIAGNOSIS — D509 Iron deficiency anemia, unspecified: Secondary | ICD-10-CM | POA: Diagnosis not present

## 2020-03-23 DIAGNOSIS — R079 Chest pain, unspecified: Secondary | ICD-10-CM | POA: Diagnosis not present

## 2020-03-23 DIAGNOSIS — Z87891 Personal history of nicotine dependence: Secondary | ICD-10-CM | POA: Diagnosis not present

## 2020-03-23 DIAGNOSIS — Z86718 Personal history of other venous thrombosis and embolism: Secondary | ICD-10-CM | POA: Diagnosis not present

## 2020-03-23 DIAGNOSIS — R918 Other nonspecific abnormal finding of lung field: Secondary | ICD-10-CM | POA: Diagnosis not present

## 2020-03-23 DIAGNOSIS — Z510 Encounter for antineoplastic radiation therapy: Secondary | ICD-10-CM | POA: Diagnosis not present

## 2020-03-23 DIAGNOSIS — C3431 Malignant neoplasm of lower lobe, right bronchus or lung: Secondary | ICD-10-CM | POA: Diagnosis not present

## 2020-03-23 DIAGNOSIS — Z86711 Personal history of pulmonary embolism: Secondary | ICD-10-CM | POA: Diagnosis not present

## 2020-03-23 LAB — COMPREHENSIVE METABOLIC PANEL
ALT: 17 U/L (ref 0–44)
AST: 20 U/L (ref 15–41)
Albumin: 2.8 g/dL — ABNORMAL LOW (ref 3.5–5.0)
Alkaline Phosphatase: 114 U/L (ref 38–126)
Anion gap: 8 (ref 5–15)
BUN: 7 mg/dL — ABNORMAL LOW (ref 8–23)
CO2: 28 mmol/L (ref 22–32)
Calcium: 8.8 mg/dL — ABNORMAL LOW (ref 8.9–10.3)
Chloride: 101 mmol/L (ref 98–111)
Creatinine, Ser: 0.95 mg/dL (ref 0.44–1.00)
GFR, Estimated: >60 mL/min (ref >60)
Glucose, Bld: 94 mg/dL (ref 70–99)
Potassium: 3.3 mmol/L — ABNORMAL LOW (ref 3.5–5.1)
Sodium: 137 mmol/L (ref 135–145)
Total Bilirubin: 0.4 mg/dL (ref 0.3–1.2)
Total Protein: 8.3 g/dL — ABNORMAL HIGH (ref 6.5–8.1)

## 2020-03-23 LAB — CBC WITH DIFFERENTIAL/PLATELET
Abs Immature Granulocytes: 0.03 10*3/uL (ref 0.00–0.07)
Basophils Absolute: 0.1 10*3/uL (ref 0.0–0.1)
Basophils Relative: 1 %
Eosinophils Absolute: 0.2 10*3/uL (ref 0.0–0.5)
Eosinophils Relative: 2 %
HCT: 33.6 % — ABNORMAL LOW (ref 36.0–46.0)
Hemoglobin: 10.5 g/dL — ABNORMAL LOW (ref 12.0–15.0)
Immature Granulocytes: 0 %
Lymphocytes Relative: 19 %
Lymphs Abs: 1.4 10*3/uL (ref 0.7–4.0)
MCH: 25.8 pg — ABNORMAL LOW (ref 26.0–34.0)
MCHC: 31.3 g/dL (ref 30.0–36.0)
MCV: 82.6 fL (ref 80.0–100.0)
Monocytes Absolute: 0.9 10*3/uL (ref 0.1–1.0)
Monocytes Relative: 11 %
Neutro Abs: 5 10*3/uL (ref 1.7–7.7)
Neutrophils Relative %: 67 %
Platelets: 339 10*3/uL (ref 150–400)
RBC: 4.07 MIL/uL (ref 3.87–5.11)
RDW: 18.7 % — ABNORMAL HIGH (ref 11.5–15.5)
WBC: 7.5 10*3/uL (ref 4.0–10.5)
nRBC: 0 % (ref 0.0–0.2)

## 2020-03-23 LAB — TROPONIN I (HIGH SENSITIVITY): Troponin I (High Sensitivity): 6 ng/L (ref <18)

## 2020-03-23 MED ORDER — POTASSIUM CHLORIDE ER 10 MEQ PO TBCR: 10.0000 meq | EXTENDED_RELEASE_TABLET | Freq: Two times a day (BID) | ORAL | 0 refills | Status: DC

## 2020-03-23 NOTE — Telephone Encounter (Signed)
Sharyn Lull with the Cancer center left a voicemail stating that the patient came in stating that she is having pain around her port which was placed on 03/15/20. There is swelling above the port and the nurses did not comfortable sticking the patient. The provider wanted the patient come in for follow up. The patient is schedule for tomorrow to be seen. Sharyn Lull will be seeing if the family will be able to bring the patient to the appointment.

## 2020-03-23 NOTE — Progress Notes (Signed)
Symptom Management [place]  [phone] [phone]  Patient Care Team: [name], FNP as PCP - General (Nurse Practitioner) Kate Sable, MD as PCP - Cardiology (Cardiology) [name], FNP (Nurse Practitioner) Telford Nab, RN as Oncology Nurse Navigator   Name of the patient: [name]  [id number]  02-20-1941   Date of visit: 03/23/20  [place] of lung  Chief complaint/ Reason for visit- Pain & port issues  Heme/Onc history:  Oncology History  Squamous cell carcinoma of lung, right ([place])  02/25/2020 Initial Diagnosis   Squamous cell carcinoma of lung, right ([place])   02/25/2020 Cancer Staging   Staging form: Lung, AJCC 8th Edition - Clinical: cT1, cN2, cM0 - Signed by Earlie Server, MD on 02/25/2020   03/29/2020 -  Chemotherapy   The patient had palonosetron (ALOXI) injection 0.25 mg, 0.25 mg, Intravenous,  Once, 0 of 4 cycles CARBOplatin (PARAPLATIN) in sodium chloride 0.9 % 100 mL chemo infusion,  (original dose ), Intravenous,  Once, 0 of 4 cycles Dose modification:   (Cycle 1) PACLitaxel (TAXOL) 84 mg in sodium chloride 0.9 % 250 mL chemo infusion (</= 74m/m2), 45 mg/m2, Intravenous,  Once, 0 of 4 cycles  for chemotherapy treatment.      Interval history- [name] 79year old female diagnosed with lung cancer currently receiving radiation presents to symptom management clinic for plaints of chest and rib pain.  Says pain started this morning and has persisted since that time.  Pain comes and goes in intensity.  Has chronic shortness of breath which is no worse. She rates pain 2/10. Nothing seems to make it better or worse. Is on pain medication chronically.    ECOG FS:1 - Symptomatic but completely ambulatory  Review of systems- Review of Systems  Constitutional: Positive for malaise/fatigue. Negative for chills, fever and weight loss.  HENT: Negative for hearing loss, nosebleeds,  sore throat and tinnitus.   Eyes: Negative for blurred vision and double vision.  Respiratory: Negative for cough, hemoptysis, shortness of breath and wheezing.   Cardiovascular: Positive for chest pain. Negative for palpitations and leg swelling.  Gastrointestinal: Negative for abdominal pain, blood in stool, constipation, diarrhea, melena, nausea and vomiting.  Genitourinary: Negative for dysuria and urgency.  Musculoskeletal: Negative for back pain, falls, joint pain and myalgias.  Skin: Negative for itching and rash.       Swelling & tenderness at port site  Neurological: Negative for dizziness, tingling, sensory change, loss of consciousness, weakness and headaches.  Endo/Heme/Allergies: Negative for environmental allergies. Does not bruise/bleed easily.  Psychiatric/Behavioral: Negative for depression. The patient is nervous/anxious. The patient does not have insomnia.      Allergies  Allergen Reactions  . Ampicillin Anaphylaxis, Nausea Only and Swelling    SEVERE HEADACHE MUSCLE CRAMPS ANGIOEDEMA THRUSH PATIENT HAS HAD A PCN REACTION WITH IMMEDIATE RASH, FACIAL/TONGUE/THROAT SWELLING, SOB, OR LIGHTHEADEDNESS WITH HYPOTENSION:  #  #  YES  #  #  Has patient had a PCN reaction causing severe rash involving mucus membranes or skin necrosis: No Has patient had a PCN reaction that required hospitalization:No Has patient had a PCN reaction occurring within the last 10 years: No.  . Ampicillin-Sulbactam Sodium Anaphylaxis and Other (See Comments)    SEVERE HEADACHE MUSCLE CRAMPS ANGIOEDEMA THRUSH PATIENT HAS HAD A PCN REACTION WITH IMMEDIATE RASH, FACIAL/TONGUE/THROAT SWELLING, SOB, OR LIGHTHEADEDNESS WITH HYPOTENSION: # # YES # # Has patient had a PCN reaction causing severe rash involving mucus membranes or skin  necrosis: No Has patient had a PCN reaction that required hospitalization:No Has patient had a PCN reaction occurring within the last 10 years: No  SEVERE HEADACHE MUSCLE  CRAMPS ANGIOEDEMA THRUSH PATIENT HAS HAD A PCN REACTION WITH IMMEDIATE RASH, FACIAL/TONGUE/THROAT SWELLING, SOB, OR LIGHTHEADEDNESS WITH HYPOTENSION: # # YES # # Has patient had a PCN reaction causing severe rash involving mucus membranes or skin necrosis: No Has patient had a PCN reaction that required hospitalization:No Has patient had a PCN reaction occurring within the last 10 years: No Muscles draw up tight - severe headache angioedema  . Ambien [Zolpidem Tartrate] Nausea And Vomiting and Other (See Comments)    HALLUCINATIONS SWEATING  . Cyclobenzaprine Other (See Comments)    EXTRAPYRAMIDAL MOVEMENT INVOLUNTARY MUSCLE JERKING EXTRAPYRAMIDAL MOVEMENT INVOLUNTARY MUSCLE JERKING Gets the jerks  . Tape Itching, Rash and Other (See Comments)    Paper tape only. Adhesive tape=itching/burning/rash  . Toradol [Ketorolac Tromethamine] Nausea Only and Other (See Comments)    HEADACHE  BACKACHE  . Sulfamethoxazole-Trimethoprim Nausea Only  . Cephalexin Rash and Other (See Comments)    HEADACHES  . Tramadol Rash and Other (See Comments)    HEADACHE     Past Medical History:  Diagnosis Date  . Acute postoperative pain 02/03/2018  . Anemia   . B12 deficiency   . Bacteremia 10/07/2014  . Bladder incontinence   . Blind left eye   . Cancer ([place])    skin cancer   . Cataract   . Cervical spine fracture ([place])   . Chest pain 07/29/2013  . Compression fracture    C7,  upper T spine -compression fx  . COPD (chronic obstructive pulmonary disease) ([place])   . DDD (degenerative disc disease), lumbar   . Depression    major  . Diffuse myofascial pain syndrome 03/24/2015  . Dyspnea    at times- when activty  . Fever 10/05/2014  . GERD (gastroesophageal reflux disease)   . Hiatal hernia   . Hyperlipidemia   . Hypertension   . 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 Macular degeneration   . On home oxygen therapy    "3L; all the time" (04/30/2018)  . Osteoarthritis   .  Osteoporosis   . Pneumonia    years ago  . Pulmonary embolism ([place]) 04/30/2018  . Reactive airway disease   . Rupture of bowel ([place])   . Sepsis ([place]) 10/08/2014  . Stroke [facility])    "mini stroke"- balance and memory issue  . Stroke ([place])   . Wrist pain, acute 09/10/2012    Past Surgical History:  Procedure Laterality Date  . ABDOMINAL HYSTERECTOMY    . ABDOMINAL SURGERY    . APPENDECTOMY    . BLADDER SURGERY    . CATARACT EXTRACTION W/ INTRAOCULAR LENS  IMPLANT, BILATERAL Bilateral   . CHOLECYSTECTOMY    . COLON SURGERY    . COLOSTOMY REVERSAL    . ESOPHAGOGASTRODUODENOSCOPY N/A 10/30/2018   Procedure: ESOPHAGOGASTRODUODENOSCOPY (EGD);  Surgeon: Lin Landsman, MD;  [facility] ENDOSCOPY;  Service: Gastroenterology;  Laterality: N/A;  . INTRAMEDULLARY (IM) NAIL INTERTROCHANTERIC Right 09/13/2016   Procedure: INTRAMEDULLARY (IM) NAIL INTERTROCHANTRIC RIGHT;  Surgeon: Leandrew Koyanagi, MD;  Location: [place];  Service: Orthopedics;  Laterality: Right;  . JOINT REPLACEMENT Bilateral   . JOINT REPLACEMENT    . KNEE SURGERY    . OSTOMY    . PORTA CATH INSERTION N/A 03/15/2020   Procedure: PORTA CATH INSERTION;  Surgeon: Katha Cabal, MD;  Location: [facility] INVASIVE CV LAB;  Service: Cardiovascular;  Laterality: N/A;  . RECTOCELE REPAIR    . TOE SURGERY Right    3rd  . TOTAL HIP ARTHROPLASTY Right 09/30/2017   Procedure: RIGHT HIP IM NAIL REMOVAL WITH CONVERSION TO TOTAL HIP ARTHOPLASTY, anterior approach;  Surgeon: Sheral Apley, MD;  Location: [facility] OR;  Service: Orthopedics;  Laterality: Right;    Social History   Socioeconomic History  . Marital status: Widowed    Spouse name: Not on file  . Number of children: 3  . Years of education: middle sch  . Highest education level: Not on file  Occupational History  . Occupation: retired    Comment: n/a  Tobacco Use  . Smoking status: Former Smoker    Packs/day: 1.00    Years: 30.00    Pack years: 30.00    Types: Cigarettes     Quit date: 1990    Years since quitting: 31.8  . Smokeless tobacco: Never Used  Vaping Use  . Vaping Use: Former  Substance and Sexual Activity  . Alcohol use: Not Currently  . Drug use: Not Currently  . Sexual activity: Not Currently  Other Topics Concern  . Not on file  Social History Narrative   ** Merged History Encounter **       ** Merged History Encounter **       Patient lives at home with daughter. Caffeine Use: 4 cups daily   Social Determinants of Health   Financial Resource Strain: Low Risk   . Difficulty of Paying Living Expenses: Not hard at all  Food Insecurity: No Food Insecurity  . Worried About Programme researcher, broadcasting/film/video in the Last Year: Never true  . Ran Out of Food in the Last Year: Never true  Transportation Needs: No Transportation Needs  . Lack of Transportation (Medical): No  . Lack of Transportation (Non-Medical): No  Physical Activity: Inactive  . Days of Exercise per Week: 0 days  . Minutes of Exercise per Session: 0 min  Stress: Stress Concern Present  . Feeling of Stress : Very much  Social Connections:   . Frequency of Communication with Friends and Family: Not on file  . Frequency of Social Gatherings with Friends and Family: Not on file  . Attends Religious Services: Not on file  . Active Member of Clubs or Organizations: Not on file  . Attends Banker Meetings: Not on file  . Marital Status: Not on file  Intimate Partner Violence: Not At Risk  . Fear of Current or Ex-Partner: No  . Emotionally Abused: No  . Physically Abused: No  . Sexually Abused: No    Family History  Problem Relation Age of Onset  . Heart failure Mother   . Heart attack Father   . Lung cancer Sister   . Deep vein thrombosis Sister   . Pulmonary embolism Son   . Deep vein thrombosis Son   . Cervical cancer Daughter      Current Outpatient Medications:  .  acetaminophen (TYLENOL) 500 MG tablet, Take 2 tablets (1,000 mg total) by mouth in the  morning, at noon, and at bedtime., Disp: , Rfl:  .  albuterol (ACCUNEB) 1.25 MG/3ML nebulizer solution, Take 3 mLs (1.25 mg total) by nebulization every 6 (six) hours as needed for wheezing., Disp: 75 mL, Rfl: 1 .  albuterol (VENTOLIN HFA) 108 (90 Base) MCG/ACT inhaler, Inhale 2 puffs into the lungs every 6 (six) hours as needed for wheezing  or shortness of breath., Disp: 1 Inhaler, Rfl: 2 .  alendronate (FOSAMAX) 70 MG tablet, TAKE 1 TABLET BY MOUTH ONCE A WEEK (Patient taking differently: Take 70 mg by mouth every Sunday. ), Disp: 12 tablet, Rfl: 1 .  ALPRAZolam (XANAX) 1 MG tablet, TAKE 0.5-1 TABLETS (0.5-1 MG TOTAL) BY MOUTH 2 (TWO) TIMES DAILY AS NEEDED FOR ANXIETY., Disp: 60 tablet, Rfl: 0 .  amLODipine (NORVASC) 5 MG tablet, TAKE 1 TABLET BY MOUTH EVERY DAY (Patient taking differently: Take 5 mg by mouth daily. ), Disp: 90 tablet, Rfl: 1 .  Cyanocobalamin (VITAMIN B-12) 2500 MCG SUBL, Take 2,500 mcg by mouth every other day. , Disp: , Rfl:  .  docusate sodium (COLACE) 100 MG capsule, Take 100 mg by mouth daily. , Disp: , Rfl:  .  DULoxetine (CYMBALTA) 60 MG capsule, Take 1 capsule (60 mg total) by mouth daily., Disp: 90 capsule, Rfl: 1 .  ELIQUIS 2.5 MG TABS tablet, TAKE 1 TABLET BY MOUTH TWICE A DAY (Patient taking differently: Take 2.5 mg by mouth in the morning and at bedtime. ), Disp: 60 tablet, Rfl: 3 .  esomeprazole (NEXIUM) 40 MG capsule, TAKE 1 CAPSULE BY MOUTH EVERY DAY, Disp: 90 capsule, Rfl: 1 .  fentaNYL (DURAGESIC) 25 MCG/HR, Place 1 patch onto the skin every 3 (three) days., Disp: , Rfl:  .  Ferrous Gluconate (IRON 27 PO), Take 27 mg by mouth 2 (two) times a week., Disp: , Rfl:  .  FLOVENT HFA 220 MCG/ACT inhaler, TAKE 1 PUFF BY MOUTH TWICE A DAY (Patient taking differently: Inhale 1 puff into the lungs in the morning and at bedtime. ), Disp: 24 Inhaler, Rfl: 5 .  fluticasone (FLONASE) 50 MCG/ACT nasal spray, SPRAY 2 SPRAYS INTO EACH NOSTRIL EVERY DAY (Patient taking differently:  Place 2 sprays into both nostrils daily. ), Disp: 48 mL, Rfl: 1 .  lidocaine-prilocaine (EMLA) cream, Apply 1 application topically as needed. Apply to port and cover with saran wrap 1-2 hours prior to port access (Patient not taking: Reported on 03/20/2020), Disp: 30 g, Rfl: 1 .  LINZESS 290 MCG CAPS capsule, TAKE 1 CAPSULE BY MOUTH EVERY DAY (Patient taking differently: Take 290 mcg by mouth daily. ), Disp: 90 capsule, Rfl: 1 .  lovastatin (MEVACOR) 10 MG tablet, TAKE 1 TABLET BY MOUTH EVERY DAY (Patient taking differently: Take 10 mg by mouth daily. ), Disp: 90 tablet, Rfl: 0 .  mirtazapine (REMERON) 7.5 MG tablet, TAKE 1 TABLET (7.5 MG TOTAL) BY MOUTH AT BEDTIME., Disp: 90 tablet, Rfl: 1 .  montelukast (SINGULAIR) 10 MG tablet, TAKE 1 TABLET BY MOUTH EVERYDAY AT BEDTIME, Disp: 21 tablet, Rfl: 0 .  Multiple Minerals-Vitamins (CAL MAG ZINC +D3 PO), Take 1 tablet by mouth daily., Disp: , Rfl:  .  NARCAN 4 MG/0.1ML LIQD nasal spray kit, Place 1 spray into the nose daily as needed (accidental overdose). , Disp: , Rfl:  .  ondansetron (ZOFRAN ODT) 4 MG disintegrating tablet, Take 1 tablet (4 mg total) by mouth every 8 (eight) hours as needed for nausea or vomiting., Disp: 30 tablet, Rfl: 0 .  orphenadrine (NORFLEX) 100 MG tablet, Take 100 mg by mouth 2 (two) times daily as needed for muscle spasms. 1/4 every 2 days, Disp: , Rfl:  .  Oxycodone HCl 10 MG TABS, Take 10 mg by mouth every 8 (eight) hours as needed (pain.). , Disp: , Rfl:  .  polyethylene glycol (MIRALAX / GLYCOLAX) packet, Take 17 g by mouth  daily. , Disp: , Rfl:  .  potassium chloride (KLOR-CON) 10 MEQ tablet, Take 1 tablet (10 mEq total) by mouth 2 (two) times daily., Disp: 30 tablet, Rfl: 0 .  pregabalin (LYRICA) 100 MG capsule, Take 1 capsule (100 mg total) by mouth 2 (two) times daily., Disp: 180 capsule, Rfl: 0 .  prochlorperazine (COMPAZINE) 10 MG tablet, Take 1 tablet (10 mg total) by mouth every 6 (six) hours as needed (Nausea or  vomiting). (Patient not taking: Reported on 03/13/2020), Disp: 30 tablet, Rfl: 1 .  promethazine (PHENERGAN) 12.5 MG tablet, Take 1 tablet (12.5 mg total) by mouth every 8 (eight) hours as needed for nausea or vomiting., Disp: 30 tablet, Rfl: 1  Physical exam:  Vitals:   03/23/20 1305  BP: 135/87  Pulse: 95  Resp: 18  Temp: 98.2 F (36.8 C)  TempSrc: Oral  SpO2: 95%   Physical Exam Constitutional:      General: She is not in acute distress.    Appearance: She is not ill-appearing.  Cardiovascular:     Rate and Rhythm: Normal rate and regular rhythm.     Heart sounds: Normal heart sounds.  Pulmonary:     Effort: Pulmonary effort is normal. No respiratory distress.     Breath sounds: Normal breath sounds.  Musculoskeletal:     Cervical back: No rigidity or tenderness.  Skin:    General: Skin is warm and dry.     Comments: Slight bruising at port site. See photo. Slightly raised above port and tender to touch. Not red, hot, or infected appearing.   Neurological:     Mental Status: She is alert and oriented to person, place, and time.  Psychiatric:        Mood and Affect: Mood is anxious.        Judgment: Judgment is impulsive.         CMP Latest Ref Rng & Units 03/23/2020  Glucose 70 - 99 mg/dL 94  BUN 8 - 23 mg/dL 7(L)  Creatinine 0.44 - 1.00 mg/dL 0.95  Sodium 135 - 145 mmol/L 137  Potassium 3.5 - 5.1 mmol/L 3.3(L)  Chloride 98 - 111 mmol/L 101  CO2 22 - 32 mmol/L 28  Calcium 8.9 - 10.3 mg/dL 8.8(L)  Total Protein 6.5 - 8.1 g/dL 8.3(H)  Total Bilirubin 0.3 - 1.2 mg/dL 0.4  Alkaline Phos 38 - 126 U/L 114  AST 15 - 41 U/L 20  ALT 0 - 44 U/L 17   CBC Latest Ref Rng & Units 03/23/2020  WBC 4.0 - 10.5 K/uL 7.5  Hemoglobin 12.0 - 15.0 g/dL 10.5(L)  Hematocrit 36 - 46 % 33.6(L)  Platelets 150 - 400 K/uL 339    No images are attached to the encounter.  MR Brain W Wo Contrast  Result Date: 03/05/2020 CLINICAL DATA:  New diagnosis squamous cell carcinoma of the  lung. Staging. EXAM: MRI HEAD WITHOUT AND WITH CONTRAST TECHNIQUE: Multiplanar, multiecho pulse sequences of the brain and surrounding structures were obtained without and with intravenous contrast. CONTRAST:  7.86m GADAVIST GADOBUTROL 1 MMOL/ML IV SOLN COMPARISON:  Head CT 08/28/2019.  Brain MRI 06/11/2013. FINDINGS: Brain: Diffusion imaging does not show any acute or subacute infarction. Mild chronic small-vessel change affects the pons. 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 Mild chronic small-vessel ischemic changes of the cerebral hemispheric deep white matter. No cortical or large vessel territory infarction. After contrast administration, no abnormal enhancement occurs. No sign of metastatic disease. Vascular: Major vessels at  the base of the brain show flow. Skull and upper cervical spine: Negative Sinuses/Orbits: Clear/normal Other: None IMPRESSION: 1. No evidence of metastatic disease. 2. Mild chronic small-vessel ischemic changes as outlined above. Electronically Signed   By: Nelson Chimes M.D.   On: 03/05/2020 05:48   PERIPHERAL VASCULAR CATHETERIZATION  Result Date: 03/15/2020 See Op Note   Assessment and plan- Patient is a 79 y.o. female diagnosed with stage III scc of lung currently receiving radiation who presents to [facility]om Management Clinic for chest pain and port site pain.   1. Portacath Pain- etiology unclear but suspect post procedural inflammation. Will send chart to Dr. Delana Meyer who inserted port to view image however feel that he will best be able to assess port in person. Due to transportation barriers, nursing to assist with coordinate transportation and appointment for patient.   2. Chest pain- suspect secondary to radiation and anxiety. EKG similar to previous and non-acute. Troponin negative. Exam reassuring. Continue to monitor.   3. Hypokalemia- improved. K 3.3 today. Tolerating oral replacement well. Increase to 10 meq twice daily. New Rx sent.   Follow up  with Dr. Tasia Catchings as scheduled. Patient advised to notify the clinic if there is no improvement in symptoms or if symptoms worsen in next 3-4 days. ER precautions discussed.      Visit Diagnosis 1. Squamous cell carcinoma of lung, right [facility])     Patient expressed understanding and was in agreement with this plan. She also understands that She can call clinic at any time with any questions, concerns, or complaints.   Thank you for allowing me to participate in the care of this very pleasant patient.   Beckey Rutter, DNP, AGNP-C [place] [facility] [place]  CC:

## 2020-03-23 NOTE — Telephone Encounter (Signed)
Received a call back from April at [facility]. An NP can see  patient tomorrow at 3 pm. I called [name], pt's daughter, and told her we made an appt for pt at 3pm at [facility] [place]. No transportation was available for this appt. Daughter stated they would make arrangements to get her there.

## 2020-03-23 NOTE — Progress Notes (Signed)
New port is extremely tender and more swollen then is normal for a new port. RN is not comfortable sticking port at this time. Had another RN assess and is in agreement. Drew peripheral labs and pt cried and cried. She cant stand the pain of needles. Performed EKG while in clinic. Port was used previously on 03/20/20 in cancer center  Infusion. Pt eating and drinking well. No IVF required today. Gave instructions to daughter to increase Potassium tabs to 1 tablet twice daily. Rx sent into pharmacy.

## 2020-03-24 ENCOUNTER — Encounter: Payer: Self-pay | Admitting: Emergency Medicine

## 2020-03-24 ENCOUNTER — Ambulatory Visit
Admission: RE | Admit: 2020-03-24 | Discharge: 2020-03-24 | Disposition: A | Discharge status: Home / Self Care | Payer: Medicare Other | Source: Ambulatory Visit | Attending: Radiation Oncology | Admitting: Radiation Oncology

## 2020-03-24 ENCOUNTER — Other Ambulatory Visit: Payer: Self-pay

## 2020-03-24 ENCOUNTER — Ambulatory Visit (INDEPENDENT_AMBULATORY_CARE_PROVIDER_SITE_OTHER): Payer: Medicare Other | Admitting: Nurse Practitioner

## 2020-03-24 ENCOUNTER — Emergency Department
Admission: EM | Admit: 2020-03-24 | Discharge: 2020-03-24 | Disposition: A | Discharge status: Home / Self Care | Payer: Medicare Other | Attending: Emergency Medicine | Admitting: Emergency Medicine

## 2020-03-24 ENCOUNTER — Emergency Department: Payer: Medicare Other

## 2020-03-24 ENCOUNTER — Inpatient Hospital Stay: Payer: Medicare Other

## 2020-03-24 DIAGNOSIS — I129 Hypertensive chronic kidney disease with stage 1 through stage 4 chronic kidney disease, or unspecified chronic kidney disease: Secondary | ICD-10-CM | POA: Insufficient documentation

## 2020-03-24 DIAGNOSIS — B9689 Other specified bacterial agents as the cause of diseases classified elsewhere: Secondary | ICD-10-CM | POA: Insufficient documentation

## 2020-03-24 DIAGNOSIS — N183 Chronic kidney disease, stage 3 unspecified: Secondary | ICD-10-CM | POA: Insufficient documentation

## 2020-03-24 DIAGNOSIS — J441 Chronic obstructive pulmonary disease with (acute) exacerbation: Secondary | ICD-10-CM | POA: Diagnosis not present

## 2020-03-24 DIAGNOSIS — R079 Chest pain, unspecified: Secondary | ICD-10-CM | POA: Insufficient documentation

## 2020-03-24 DIAGNOSIS — Z96641 Presence of right artificial hip joint: Secondary | ICD-10-CM | POA: Insufficient documentation

## 2020-03-24 DIAGNOSIS — C771 Secondary and unspecified malignant neoplasm of intrathoracic lymph nodes: Secondary | ICD-10-CM | POA: Diagnosis not present

## 2020-03-24 DIAGNOSIS — Z79899 Other long term (current) drug therapy: Secondary | ICD-10-CM | POA: Insufficient documentation

## 2020-03-24 DIAGNOSIS — Z7951 Long term (current) use of inhaled steroids: Secondary | ICD-10-CM | POA: Diagnosis not present

## 2020-03-24 DIAGNOSIS — Z85828 Personal history of other malignant neoplasm of skin: Secondary | ICD-10-CM | POA: Insufficient documentation

## 2020-03-24 DIAGNOSIS — Z7901 Long term (current) use of anticoagulants: Secondary | ICD-10-CM | POA: Diagnosis not present

## 2020-03-24 DIAGNOSIS — Z87891 Personal history of nicotine dependence: Secondary | ICD-10-CM | POA: Insufficient documentation

## 2020-03-24 DIAGNOSIS — J9811 Atelectasis: Secondary | ICD-10-CM | POA: Diagnosis not present

## 2020-03-24 DIAGNOSIS — Z510 Encounter for antineoplastic radiation therapy: Secondary | ICD-10-CM | POA: Diagnosis not present

## 2020-03-24 DIAGNOSIS — Z96653 Presence of artificial knee joint, bilateral: Secondary | ICD-10-CM | POA: Insufficient documentation

## 2020-03-24 DIAGNOSIS — C3431 Malignant neoplasm of lower lobe, right bronchus or lung: Secondary | ICD-10-CM | POA: Diagnosis not present

## 2020-03-24 DIAGNOSIS — N390 Urinary tract infection, site not specified: Secondary | ICD-10-CM | POA: Diagnosis not present

## 2020-03-24 DIAGNOSIS — R059 Cough, unspecified: Secondary | ICD-10-CM | POA: Diagnosis not present

## 2020-03-24 LAB — URINALYSIS, COMPLETE (UACMP) WITH MICROSCOPIC
Bilirubin Urine: NEGATIVE
Glucose, UA: NEGATIVE mg/dL
Hgb urine dipstick: NEGATIVE
Ketones, ur: NEGATIVE mg/dL
Nitrite: NEGATIVE
Protein, ur: 100 mg/dL — AB
Specific Gravity, Urine: 1.015 (ref 1.005–1.030)
Squamous Epithelial / HPF: NONE SEEN (ref 0–5)
WBC, UA: >50 WBC/hpf — ABNORMAL HIGH (ref 0–5)
pH: 5 (ref 5.0–8.0)

## 2020-03-24 LAB — CBC
HCT: 33.9 % — ABNORMAL LOW (ref 36.0–46.0)
Hemoglobin: 10.3 g/dL — ABNORMAL LOW (ref 12.0–15.0)
MCH: 25.4 pg — ABNORMAL LOW (ref 26.0–34.0)
MCHC: 30.4 g/dL (ref 30.0–36.0)
MCV: 83.7 fL (ref 80.0–100.0)
Platelets: 360 10*3/uL (ref 150–400)
RBC: 4.05 MIL/uL (ref 3.87–5.11)
RDW: 18.9 % — ABNORMAL HIGH (ref 11.5–15.5)
WBC: 7.8 10*3/uL (ref 4.0–10.5)
nRBC: 0 % (ref 0.0–0.2)

## 2020-03-24 LAB — TROPONIN I (HIGH SENSITIVITY)
Troponin I (High Sensitivity): 6 ng/L (ref <18)
Troponin I (High Sensitivity): 6 ng/L (ref <18)

## 2020-03-24 LAB — BASIC METABOLIC PANEL
Anion gap: 11 (ref 5–15)
BUN: 8 mg/dL (ref 8–23)
CO2: 25 mmol/L (ref 22–32)
Calcium: 9.1 mg/dL (ref 8.9–10.3)
Chloride: 100 mmol/L (ref 98–111)
Creatinine, Ser: 1.08 mg/dL — ABNORMAL HIGH (ref 0.44–1.00)
GFR, Estimated: 53 mL/min — ABNORMAL LOW (ref >60)
Glucose, Bld: 117 mg/dL — ABNORMAL HIGH (ref 70–99)
Potassium: 2.9 mmol/L — ABNORMAL LOW (ref 3.5–5.1)
Sodium: 136 mmol/L (ref 135–145)

## 2020-03-24 MED ORDER — LEVOFLOXACIN IN D5W 500 MG/100ML IV SOLN
500.0000 mg | Freq: Once | INTRAVENOUS | Status: AC
  Administered 2020-03-24: 500 mg via INTRAVENOUS
  Filled 2020-03-24: qty 100

## 2020-03-24 MED ORDER — IOHEXOL 350 MG/ML SOLN
75.0000 mL | Freq: Once | INTRAVENOUS | Status: AC | PRN
  Administered 2020-03-24: 75 mL via INTRAVENOUS

## 2020-03-24 MED ORDER — LEVOFLOXACIN 500 MG PO TABS: 500.0000 mg | ORAL_TABLET | Freq: Every day | ORAL | 0 refills | Status: AC

## 2020-03-24 MED ORDER — HYDROMORPHONE HCL 1 MG/ML IJ SOLN
1.0000 mg | Freq: Once | INTRAMUSCULAR | Status: AC
  Administered 2020-03-24: 1 mg via INTRAVENOUS
  Filled 2020-03-24: qty 1

## 2020-03-24 NOTE — ED Triage Notes (Signed)
Has lung cancer and for past 2 days has increased pain left lower chest that is really bad when she coughs.  Her cancer doctors sent her here.  Has been receiving radiation x 5 days and is supposed to start chemo soon.  She is alert and oriented.  Says sh e is on home oxygen

## 2020-03-24 NOTE — ED Provider Notes (Signed)
[facility] Emergency Department Provider Note  Time seen: 2:27 PM  I have reviewed the triage vital signs and the nursing notes.   HISTORY  Chief Complaint Chest Pain   HPI [name] is a 79 y.o. female with a past medical history of lung cancer currently on radiation therapy, hypertension, hyperlipidemia, gastric reflux, COPD, prior pulmonary embolism presents to the emergency department for chest pain.  According to the patient and daughter patient is currently on radiation therapy for her lung cancer, and since last night has been complaining of central to left-sided chest pain worse with deep inspiration.  Daughter states patient has a past history of a pulmonary embolism and was concerned that she could have another blood clot so she came to the emergency department for evaluation.  Patient denies any fever.  Denies any increased cough.  Denies any increased shortness of breath.  Patient is currently on fentanyl patch as well as oxycodone at home for breakthrough pain but states it is not helping her chest pain.   Past Medical History:  Diagnosis Date  . Acute postoperative pain 02/03/2018  . Anemia   . B12 deficiency   . Bacteremia 10/07/2014  . Bladder incontinence   . Blind left eye   . Cancer ([place])    skin cancer   . Cataract   . Cervical spine fracture ([place])   . Chest pain 07/29/2013  . Compression fracture    C7,  upper T spine -compression fx  . COPD (chronic obstructive pulmonary disease) ([place])   . DDD (degenerative disc disease), lumbar   . Depression    major  . Diffuse myofascial pain syndrome 03/24/2015  . Dyspnea    at times- when activty  . Fever 10/05/2014  . GERD (gastroesophageal reflux disease)   . Hiatal hernia   . Hyperlipidemia   . Hypertension   . 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 Macular degeneration   . On home oxygen therapy    "3L; all the time" (04/30/2018)  . Osteoarthritis   . Osteoporosis   .  Pneumonia    years ago  . Pulmonary embolism ([place]) 04/30/2018  . Reactive airway disease   . Rupture of bowel ([place])   . Sepsis ([place]) 10/08/2014  . Stroke [facility])    "mini stroke"- balance and memory issue  . Stroke ([place])   . Wrist pain, acute 09/10/2012    Patient Active Problem List   Diagnosis Date Noted  . Urinary frequency 03/09/2020  . 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 Goals of care, counseling/discussion 02/25/2020  . Pulmonary emphysema ([place]) 11/11/2019  . Loose total hip arthroplasty ([place]) 07/07/2019  . Non-intractable vomiting 05/19/2019  . Anemia 05/04/2019  . History of deep vein thrombosis of lower extremity 01/05/2019  . GI bleed 10/29/2018  . ILD (interstitial lung disease) ([place]) 08/12/2018  . COPD exacerbation ([place]) 05/17/2018  . Pressure injury of skin 05/04/2018  . PE (pulmonary thromboembolism) ([place]) 04/30/2018  . Acute metabolic encephalopathy   . Respiratory failure with hypoxia ([place]) 04/20/2018  . CAP (community acquired pneumonia) 04/20/2018  . Chronic lower extremity pain (Right) 04/07/2018  . Abnormal MRI, lumbar spine 04/07/2018  . Lumbar facet hypertrophy (Multilevel) (Bilateral) 04/07/2018  . Lumbar foraminal stenosis (L3-4, L4-5) (Bilateral) 04/07/2018  . Annular tear of lumbar disc (L3-4, L4-5) 04/07/2018  . Closed hip fracture, sequela (Right) 02/03/2018  . It band syndrome, right 12/18/2017  . History of hip replacement (Right) 12/18/2017  .  Ileus ([place]) 10/27/2017  . Primary osteoarthritis of hip 09/30/2017  . History of small bowel obstruction 09/10/2017  . 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 SBO (small bowel obstruction) ([place]) 08/24/2017  . Abnormal x-ray of pelvis 08/13/2017  . Other specified dorsopathies, sacral and sacrococcygeal region 08/04/2017  . Spondylosis without myelopathy or radiculopathy, lumbosacral region 07/17/2017  . Non-traumatic compression fracture of T3 thoracic vertebra,  sequela 07/02/2017  . High risk medication use 07/02/2017  . At high risk for falls 07/02/2017  . Chronic sacroiliac joint pain (Right) 07/02/2017  . CKD (chronic kidney disease) stage 3, GFR 30-59 ml/min (HCC) 04/23/2017  . Chronic knee pain s/p total knee replacement (TKR) (Right) 04/02/2017  . DDD (degenerative disc disease), lumbar 03/05/2017  . Chronic knee pain (Bilateral) (R>L) 03/05/2017  . Osteoarthritis 03/05/2017  . Chronic musculoskeletal pain 03/05/2017  . Neurogenic pain 03/05/2017  . Chronic myofascial pain 02/12/2017  . Lumbar facet syndrome (Bilateral) (L>R) 02/12/2017  . Long term prescription benzodiazepine use 02/12/2017  . Disorder of skeletal system 02/12/2017  . Pharmacologic therapy 02/12/2017  . Problems influencing health status 02/12/2017  . 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 NSAID long-term use 02/12/2017  . DDD (degenerative disc disease), cervical 11/11/2016  . Lumbar facet arthropathy (Bilateral) 11/11/2016  . 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 CAFL (chronic airflow limitation) ([place]) 11/07/2016  . Depression, major, in partial remission ([place]) 11/07/2016  . 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 Long term current use of opiate analgesic 11/07/2016  . Long term prescription opiate use 11/07/2016  . Chronic pain syndrome 11/07/2016  . Chronic neck pain (Secondary Area of Pain) (Bilateral) (L>R) 11/07/2016  . Chronic hip pain (Right) 11/07/2016  . Age-related osteoporosis with current pathological fracture with routine healing 09/20/2016  . History of stroke 09/20/2016  . Intertrochanteric fracture of femur, sequela (Right) 09/13/2016  . Chronic shoulder pain (Bilateral) 08/12/2016  . Hx of total knee replacement (Bilateral) 04/04/2016  . Chronic shoulder pain (Left) 02/28/2016  . Hoarseness 01/12/2016  . Pharyngoesophageal dysphagia 01/12/2016  . Chronic low back pain [facility] Area of Pain) (Bilateral) (L>R) 10/26/2015   . 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 Syncope, non cardiac 05/22/2015  . Thrombocytopenia ([place]) 10/07/2014  . Hypoxia 10/05/2014  . Anxiety 06/15/2014  . Duodenogastric reflux 06/15/2014  . Benign essential HTN 06/15/2014  . Hypercholesteremia 07/30/2013  . HTN (hypertension) 07/30/2013  . GERD (gastroesophageal reflux disease) 07/30/2013  . Memory loss 05/03/2013  . Chronic knee pain (Primary Area of Pain) (Right) 09/10/2012    Past Surgical History:  Procedure Laterality Date  . ABDOMINAL HYSTERECTOMY    . ABDOMINAL SURGERY    . APPENDECTOMY    . BLADDER SURGERY    . CATARACT EXTRACTION W/ INTRAOCULAR LENS  IMPLANT, BILATERAL Bilateral   . CHOLECYSTECTOMY    . COLON SURGERY    . COLOSTOMY REVERSAL    . ESOPHAGOGASTRODUODENOSCOPY N/A 10/30/2018   Procedure: ESOPHAGOGASTRODUODENOSCOPY (EGD);  Surgeon: Lin Landsman, MD;  Location: [facility] ENDOSCOPY;  Service: Gastroenterology;  Laterality: N/A;  . INTRAMEDULLARY (IM) NAIL INTERTROCHANTERIC Right 09/13/2016   Procedure: INTRAMEDULLARY (IM) NAIL INTERTROCHANTRIC RIGHT;  Surgeon: Leandrew Koyanagi, MD;  Location: [place];  Service: Orthopedics;  Laterality: Right;  . JOINT REPLACEMENT Bilateral   . JOINT REPLACEMENT    . KNEE SURGERY    . OSTOMY    . PORTA CATH INSERTION N/A 03/15/2020  Procedure: PORTA CATH INSERTION;  Surgeon: Katha Cabal, MD;  Location: [place] CV LAB;  Service: Cardiovascular;  Laterality: N/A;  . RECTOCELE REPAIR    . TOE SURGERY Right    3rd  . TOTAL HIP ARTHROPLASTY Right 09/30/2017   Procedure: RIGHT HIP IM NAIL REMOVAL WITH CONVERSION TO TOTAL HIP ARTHOPLASTY, anterior approach;  Surgeon: Renette Butters, MD;  Location: [place];  Service: Orthopedics;  Laterality: Right;    Prior to Admission medications   Medication Sig Start Date End Date Taking? Authorizing Provider  acetaminophen (TYLENOL) 500 MG tablet Take 2  tablets (1,000 mg total) by mouth in the morning, at noon, and at bedtime. 02/03/20   Duffy Bruce, MD  albuterol (ACCUNEB) 1.25 MG/3ML nebulizer solution Take 3 mLs (1.25 mg total) by nebulization every 6 (six) hours as needed for wheezing. 08/26/18   [name], FNP  albuterol (VENTOLIN HFA) 108 (90 Base) MCG/ACT inhaler Inhale 2 puffs into the lungs every 6 (six) hours as needed for wheezing or shortness of breath. 09/25/18   [name], FNP  alendronate (FOSAMAX) 70 MG tablet TAKE 1 TABLET BY MOUTH ONCE A WEEK Patient taking differently: Take 70 mg by mouth every Sunday.  06/09/19   [name], FNP  ALPRAZolam (XANAX) 1 MG tablet TAKE 0.5-1 TABLETS (0.5-1 MG TOTAL) BY MOUTH 2 (TWO) TIMES DAILY AS NEEDED FOR ANXIETY. 02/27/20   [name], FNP  amLODipine (NORVASC) 5 MG tablet TAKE 1 TABLET BY MOUTH EVERY DAY Patient taking differently: Take 5 mg by mouth daily.  08/30/19   [name], FNP  Cyanocobalamin (VITAMIN B-12) 2500 MCG SUBL Take 2,500 mcg by mouth every other day.     [provider]  docusate sodium (COLACE) 100 MG capsule Take 100 mg by mouth daily.     [provider]  DULoxetine (CYMBALTA) 60 MG capsule Take 1 capsule (60 mg total) by mouth daily. 03/13/20   [name], FNP  ELIQUIS 2.5 MG TABS tablet TAKE 1 TABLET BY MOUTH TWICE A DAY Patient taking differently: Take 2.5 mg by mouth in the morning and at bedtime.  03/01/20   Earlie Server, MD  esomeprazole (NEXIUM) 40 MG capsule TAKE 1 CAPSULE BY MOUTH EVERY DAY 03/17/20   [name], FNP  fentaNYL (DURAGESIC) 25 MCG/HR Place 1 patch onto the skin every 3 (three) days. 02/11/20   [provider]  Ferrous Gluconate (IRON 27 PO) Take 27 mg by mouth 2 (two) times a week.    [provider]  FLOVENT HFA 220 MCG/ACT inhaler TAKE 1 PUFF BY MOUTH TWICE A DAY Patient taking differently: Inhale 1 puff into the lungs in the morning and at bedtime.  12/14/19    [name], FNP  fluticasone (FLONASE) 50 MCG/ACT nasal spray SPRAY 2 SPRAYS INTO EACH NOSTRIL EVERY DAY Patient taking differently: Place 2 sprays into both nostrils daily.  08/25/19   [name], FNP  lidocaine-prilocaine (EMLA) cream Apply 1 application topically as needed. Apply to port and cover with saran wrap 1-2 hours prior to port access Patient not taking: Reported on 03/20/2020 03/20/20   Earlie Server, MD  LINZESS 290 MCG CAPS capsule TAKE 1 [place] Patient taking differently: Take 290 mcg by mouth daily.  08/30/19   [name], FNP  lovastatin (MEVACOR) 10 MG tablet TAKE 1 TABLET BY MOUTH EVERY DAY Patient taking differently: Take 10 mg by mouth daily.  03/02/20   [name], FNP  mirtazapine (REMERON) 7.5 MG tablet TAKE 1 TABLET (7.5 MG TOTAL) BY MOUTH AT BEDTIME. 02/01/20   Bedsole, Amy E, MD  montelukast (SINGULAIR) 10 MG tablet TAKE 1 TABLET BY MOUTH EVERYDAY AT BEDTIME 03/19/20   Earlie Server, MD  Multiple Minerals-Vitamins (CAL MAG ZINC +D3 PO) Take 1 tablet by mouth daily.    [provider]  NARCAN 4 MG/0.1ML LIQD nasal spray kit Place 1 spray into the nose daily as needed (accidental overdose).  09/10/18   [provider]  ondansetron (ZOFRAN ODT) 4 MG disintegrating tablet Take 1 tablet (4 mg total) by mouth every 8 (eight) hours as needed for nausea or vomiting. 06/12/18   [name], FNP  orphenadrine (NORFLEX) 100 MG tablet Take 100 mg by mouth 2 (two) times daily as needed for muscle spasms. 1/4 every 2 days 11/11/19   [provider]  Oxycodone HCl 10 MG TABS Take 10 mg by mouth every 8 (eight) hours as needed (pain.).  09/23/18   [provider]  polyethylene glycol (MIRALAX / GLYCOLAX) packet Take 17 g by mouth daily.     [provider]  potassium chloride (KLOR-CON) 10 MEQ tablet Take 1 tablet (10 mEq total) by mouth 2 (two) times daily. 03/23/20   Verlon Au, NP  pregabalin  (LYRICA) 100 MG capsule Take 1 capsule (100 mg total) by mouth 2 (two) times daily. 03/01/20   [name], FNP  prochlorperazine (COMPAZINE) 10 MG tablet Take 1 tablet (10 mg total) by mouth every 6 (six) hours as needed (Nausea or vomiting). Patient not taking: Reported on 03/13/2020 02/25/20   Earlie Server, MD  promethazine (PHENERGAN) 12.5 MG tablet Take 1 tablet (12.5 mg total) by mouth every 8 (eight) hours as needed for nausea or vomiting. 02/08/20   [name], FNP    Allergies  Allergen Reactions  . Ampicillin Anaphylaxis, Nausea Only and Swelling    SEVERE HEADACHE MUSCLE CRAMPS ANGIOEDEMA THRUSH PATIENT HAS HAD A PCN REACTION WITH IMMEDIATE RASH, FACIAL/TONGUE/THROAT SWELLING, SOB, OR LIGHTHEADEDNESS WITH HYPOTENSION:  #  #  YES  #  #  Has patient had a PCN reaction causing severe rash involving mucus membranes or skin necrosis: No Has patient had a PCN reaction that required hospitalization:No Has patient had a PCN reaction occurring within the last 10 years: No.  . Ampicillin-Sulbactam Sodium Anaphylaxis and Other (See Comments)    SEVERE HEADACHE MUSCLE CRAMPS ANGIOEDEMA THRUSH PATIENT HAS HAD A PCN REACTION WITH IMMEDIATE RASH, FACIAL/TONGUE/THROAT SWELLING, SOB, OR LIGHTHEADEDNESS WITH HYPOTENSION: # # YES # # Has patient had a PCN reaction causing severe rash involving mucus membranes or skin necrosis: No Has patient had a PCN reaction that required hospitalization:No Has patient had a PCN reaction occurring within the last 10 years: No  SEVERE HEADACHE MUSCLE CRAMPS ANGIOEDEMA THRUSH PATIENT HAS HAD A PCN REACTION WITH IMMEDIATE RASH, FACIAL/TONGUE/THROAT SWELLING, SOB, OR LIGHTHEADEDNESS WITH HYPOTENSION: # # YES # # Has patient had a PCN reaction causing severe rash involving mucus membranes or skin necrosis: No Has patient had a PCN reaction that required hospitalization:No Has patient had a PCN reaction occurring within the last 10 years: No Muscles  draw up tight - severe headache angioedema  . Ambien [Zolpidem Tartrate] Nausea And Vomiting and Other (See Comments)    HALLUCINATIONS SWEATING  . Cyclobenzaprine Other (See Comments)    EXTRAPYRAMIDAL MOVEMENT INVOLUNTARY MUSCLE JERKING EXTRAPYRAMIDAL MOVEMENT INVOLUNTARY MUSCLE JERKING Gets  the jerks  . Tape Itching, Rash and Other (See Comments)    Paper tape only. Adhesive tape=itching/burning/rash  . Toradol [Ketorolac Tromethamine] Nausea Only and Other (See Comments)    HEADACHE  BACKACHE  . Sulfamethoxazole-Trimethoprim Nausea Only  . Cephalexin Rash and Other (See Comments)    HEADACHES  . Tramadol Rash and Other (See Comments)    HEADACHE     Family History  Problem Relation Age of Onset  . Heart failure Mother   . Heart attack Father   . Lung cancer Sister   . Deep vein thrombosis Sister   . Pulmonary embolism Son   . Deep vein thrombosis Son   . Cervical cancer Daughter     Social History Social History   Tobacco Use  . Smoking status: Former Smoker    Packs/day: 1.00    Years: 30.00    Pack years: 30.00    Types: Cigarettes    Quit date: 1990    Years since quitting: 31.8  . Smokeless tobacco: Never Used  Vaping Use  . Vaping Use: Former  Substance Use Topics  . Alcohol use: Not Currently  . Drug use: Not Currently    Review of Systems Constitutional: Negative for fever. Cardiovascular: Central left-sided chest pain worse with deep inspiration. Respiratory: No increased shortness of breath per patient. Gastrointestinal: Negative for abdominal pain, vomiting  Musculoskeletal: Negative for musculoskeletal complaints Neurological: Negative for headache All other ROS negative  ____________________________________________   PHYSICAL EXAM:  VITAL SIGNS: ED Triage Vitals  Enc Vitals Group     BP 03/24/20 1147 116/70     Pulse Rate 03/24/20 1147 90     Resp 03/24/20 1147 16     Temp 03/24/20 1147 98.8 F (37.1 C)     Temp Source  03/24/20 1147 Oral     SpO2 03/24/20 1147 92 %     Weight 03/24/20 1148 157 lb 8 oz (71.4 kg)     Height --      Head Circumference --      Peak Flow --      Pain Score 03/24/20 1147 0     Pain Loc --      Pain Edu? --      Excl. in [place]? --    Constitutional: Alert and oriented. Well appearing and in no distress. Eyes: Normal exam ENT      Head: Normocephalic and atraumatic.      Mouth/Throat: Mucous membranes are moist. Cardiovascular: Normal rate, regular rhythm.  Respiratory: Normal respiratory effort without tachypnea nor retractions. Breath sounds are clear Gastrointestinal: Soft and nontender. No distention.   Musculoskeletal: Nontender with normal range of motion in all extremities.  Neurologic:  Normal speech and language. No gross focal neurologic deficits Skin:  Skin is warm, dry and intact.  Psychiatric: Mood and affect are normal.  ____________________________________________    EKG  EKG viewed and interpreted by myself appears to show sinus arrhythmia at 122 bpm with a narrow QRS, normal axis, normal intervals, nonspecific ST changes.  ____________________________________________    RADIOLOGY  CTA shows no significant acute finding.  Patient to continue following up with her oncologist.  ____________________________________________   INITIAL IMPRESSION / [place] / ED COURSE  Pertinent labs & imaging results that were available during my care of the patient were reviewed by me and considered in my medical decision making (see chart for details).   Patient presents to the emergency department for chest pain.  Patient is currently  on radiation therapy and gets radiation to the chest.  Chest pain could be related to her radiation treatments however given the patient's history of a past pulmonary embolism we will obtain a CTA of the chest to rule out PE or infectious etiology.  We will treat pain and IV hydrate while awaiting results.  Reassuringly  patient's lab work including cardiac enzymes are negative.  We will repeat a cardiac enzyme as a precaution.  Patient's urinalysis has resulted showing urinary tract infection.  Due to the patient's allergies we will treat with Levaquin 500 mg IV.  CTA is negative for significant acute finding.  Repeat troponin negative.  Will discharge on antibiotics.  Patient will follow up with her pain management physician.  KEVIA ZAUCHA was evaluated in Emergency Department on 03/24/2020 for the symptoms described in the history of present illness. She was evaluated in the context of the global COVID-19 pandemic, which necessitated consideration that the patient might be at risk for infection with the SARS-CoV-2 virus that causes COVID-19. Institutional protocols and algorithms that pertain to the evaluation of patients at risk for COVID-19 are in a state of rapid change based on information released by regulatory bodies including the CDC and federal and state organizations. These policies and algorithms were followed during the patient's care in the ED.  ____________________________________________   FINAL CLINICAL IMPRESSION(S) / ED DIAGNOSES  Urinary tract infection Chest pain   Harvest Dark, MD 03/24/20 1537

## 2020-03-26 ENCOUNTER — Other Ambulatory Visit: Payer: Self-pay | Admitting: Family Medicine

## 2020-03-26 LAB — URINE CULTURE: Culture: >=100000 — AB

## 2020-03-27 ENCOUNTER — Ambulatory Visit
Admission: RE | Admit: 2020-03-27 | Discharge: 2020-03-27 | Disposition: A | Discharge status: Home / Self Care | Payer: Medicare Other | Source: Ambulatory Visit | Attending: Radiation Oncology | Admitting: Radiation Oncology

## 2020-03-27 ENCOUNTER — Inpatient Hospital Stay: Payer: Medicare Other | Attending: Nurse Practitioner

## 2020-03-27 DIAGNOSIS — C771 Secondary and unspecified malignant neoplasm of intrathoracic lymph nodes: Secondary | ICD-10-CM | POA: Insufficient documentation

## 2020-03-27 DIAGNOSIS — D649 Anemia, unspecified: Secondary | ICD-10-CM | POA: Insufficient documentation

## 2020-03-27 DIAGNOSIS — C3431 Malignant neoplasm of lower lobe, right bronchus or lung: Secondary | ICD-10-CM | POA: Insufficient documentation

## 2020-03-27 DIAGNOSIS — Z510 Encounter for antineoplastic radiation therapy: Secondary | ICD-10-CM | POA: Insufficient documentation

## 2020-03-27 DIAGNOSIS — E876 Hypokalemia: Secondary | ICD-10-CM | POA: Insufficient documentation

## 2020-03-27 NOTE — Telephone Encounter (Signed)
Last office visit 03/09/2020 with Dr. Einar Pheasant for Urinary Frequency.  Last refilled 08/30/19 for #90 with 1 refill.  Next visit 03/29/2020 for kidney issues.

## 2020-03-28 ENCOUNTER — Ambulatory Visit: Payer: Medicare Other

## 2020-03-28 ENCOUNTER — Telehealth: Payer: Self-pay | Admitting: Cardiology

## 2020-03-28 ENCOUNTER — Inpatient Hospital Stay: Payer: Medicare Other

## 2020-03-29 ENCOUNTER — Other Ambulatory Visit: Payer: Self-pay

## 2020-03-29 ENCOUNTER — Ambulatory Visit
Admission: RE | Admit: 2020-03-29 | Discharge: 2020-03-29 | Disposition: A | Discharge status: Home / Self Care | Payer: Medicare Other | Source: Ambulatory Visit | Attending: Radiation Oncology | Admitting: Radiation Oncology

## 2020-03-29 ENCOUNTER — Inpatient Hospital Stay: Payer: Medicare Other

## 2020-03-29 ENCOUNTER — Ambulatory Visit: Payer: Medicare Other | Admitting: Family Medicine

## 2020-03-29 ENCOUNTER — Encounter: Payer: Self-pay | Admitting: *Deleted

## 2020-03-29 ENCOUNTER — Inpatient Hospital Stay (HOSPITAL_BASED_OUTPATIENT_CLINIC_OR_DEPARTMENT_OTHER): Payer: Medicare Other | Admitting: Oncology

## 2020-03-29 ENCOUNTER — Encounter: Payer: Self-pay | Admitting: Oncology

## 2020-03-29 VITALS — BP 121/81 | HR 109 | Temp 96.4°F | Wt 163.4 lb

## 2020-03-29 DIAGNOSIS — C3431 Malignant neoplasm of lower lobe, right bronchus or lung: Secondary | ICD-10-CM | POA: Diagnosis not present

## 2020-03-29 DIAGNOSIS — R112 Nausea with vomiting, unspecified: Secondary | ICD-10-CM | POA: Diagnosis not present

## 2020-03-29 DIAGNOSIS — E876 Hypokalemia: Secondary | ICD-10-CM | POA: Diagnosis not present

## 2020-03-29 DIAGNOSIS — Z510 Encounter for antineoplastic radiation therapy: Secondary | ICD-10-CM | POA: Diagnosis not present

## 2020-03-29 DIAGNOSIS — F419 Anxiety disorder, unspecified: Secondary | ICD-10-CM | POA: Diagnosis not present

## 2020-03-29 DIAGNOSIS — C771 Secondary and unspecified malignant neoplasm of intrathoracic lymph nodes: Secondary | ICD-10-CM | POA: Diagnosis not present

## 2020-03-29 DIAGNOSIS — D649 Anemia, unspecified: Secondary | ICD-10-CM | POA: Diagnosis not present

## 2020-03-29 DIAGNOSIS — C3491 Malignant neoplasm of unspecified part of right bronchus or lung: Secondary | ICD-10-CM

## 2020-03-29 LAB — CBC WITH DIFFERENTIAL/PLATELET
Abs Immature Granulocytes: 0.03 10*3/uL (ref 0.00–0.07)
Basophils Absolute: 0 10*3/uL (ref 0.0–0.1)
Basophils Relative: 1 %
Eosinophils Absolute: 0.3 10*3/uL (ref 0.0–0.5)
Eosinophils Relative: 4 %
HCT: 31.7 % — ABNORMAL LOW (ref 36.0–46.0)
Hemoglobin: 9.9 g/dL — ABNORMAL LOW (ref 12.0–15.0)
Immature Granulocytes: 1 %
Lymphocytes Relative: 14 %
Lymphs Abs: 0.9 10*3/uL (ref 0.7–4.0)
MCH: 26.5 pg (ref 26.0–34.0)
MCHC: 31.2 g/dL (ref 30.0–36.0)
MCV: 85 fL (ref 80.0–100.0)
Monocytes Absolute: 0.8 10*3/uL (ref 0.1–1.0)
Monocytes Relative: 13 %
Neutro Abs: 4.3 10*3/uL (ref 1.7–7.7)
Neutrophils Relative %: 67 %
Platelets: 333 10*3/uL (ref 150–400)
RBC: 3.73 MIL/uL — ABNORMAL LOW (ref 3.87–5.11)
RDW: 19 % — ABNORMAL HIGH (ref 11.5–15.5)
WBC: 6.3 10*3/uL (ref 4.0–10.5)
nRBC: 0 % (ref 0.0–0.2)

## 2020-03-29 LAB — COMPREHENSIVE METABOLIC PANEL
ALT: 9 U/L (ref 0–44)
AST: 20 U/L (ref 15–41)
Albumin: 2.6 g/dL — ABNORMAL LOW (ref 3.5–5.0)
Alkaline Phosphatase: 83 U/L (ref 38–126)
Anion gap: 9 (ref 5–15)
BUN: 7 mg/dL — ABNORMAL LOW (ref 8–23)
CO2: 26 mmol/L (ref 22–32)
Calcium: 9 mg/dL (ref 8.9–10.3)
Chloride: 102 mmol/L (ref 98–111)
Creatinine, Ser: 1.01 mg/dL — ABNORMAL HIGH (ref 0.44–1.00)
GFR, Estimated: 57 mL/min — ABNORMAL LOW (ref >60)
Glucose, Bld: 111 mg/dL — ABNORMAL HIGH (ref 70–99)
Potassium: 3 mmol/L — ABNORMAL LOW (ref 3.5–5.1)
Sodium: 137 mmol/L (ref 135–145)
Total Bilirubin: 0.2 mg/dL — ABNORMAL LOW (ref 0.3–1.2)
Total Protein: 7.8 g/dL (ref 6.5–8.1)

## 2020-03-29 IMAGING — DX DG HIP (WITH OR WITHOUT PELVIS) 2-3V*R*
3 series · 3 of 3 positions shown · non-contrast
Comparison: 05/25/2018

CLINICAL DATA: Right-sided hip pain following fall

EXAM:
DG HIP (WITH OR WITHOUT PELVIS) 2-3V RIGHT

[pelvis ap]
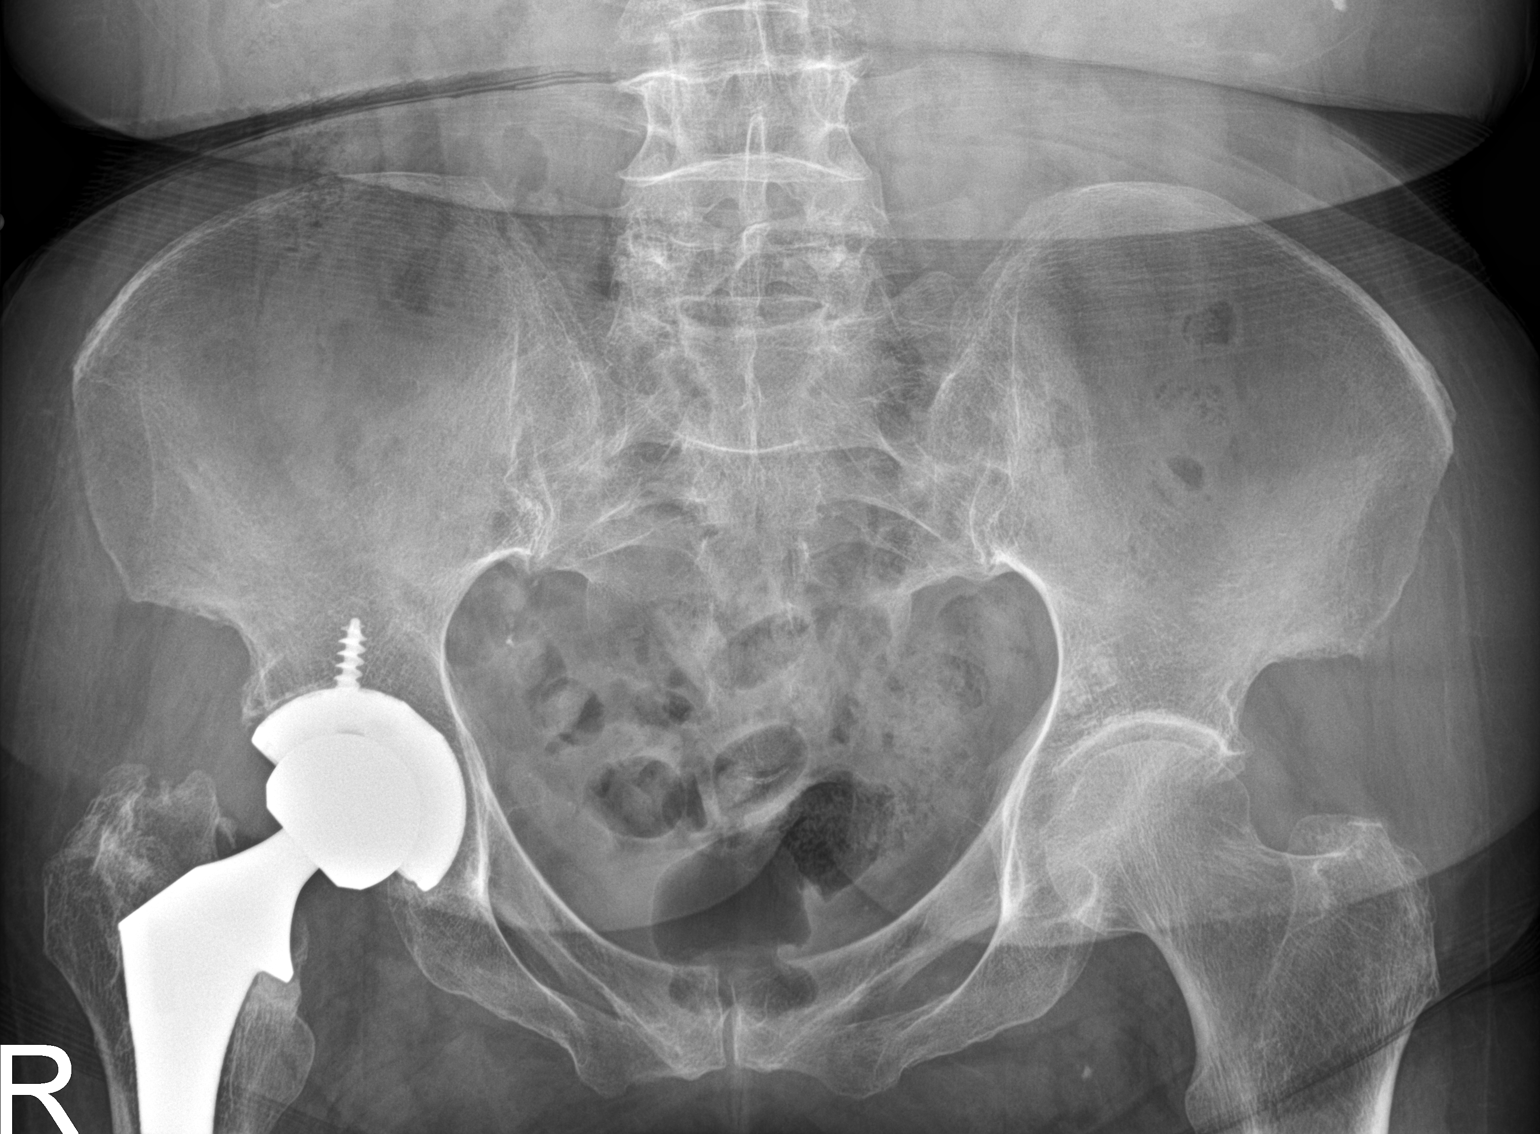

[hip ap]
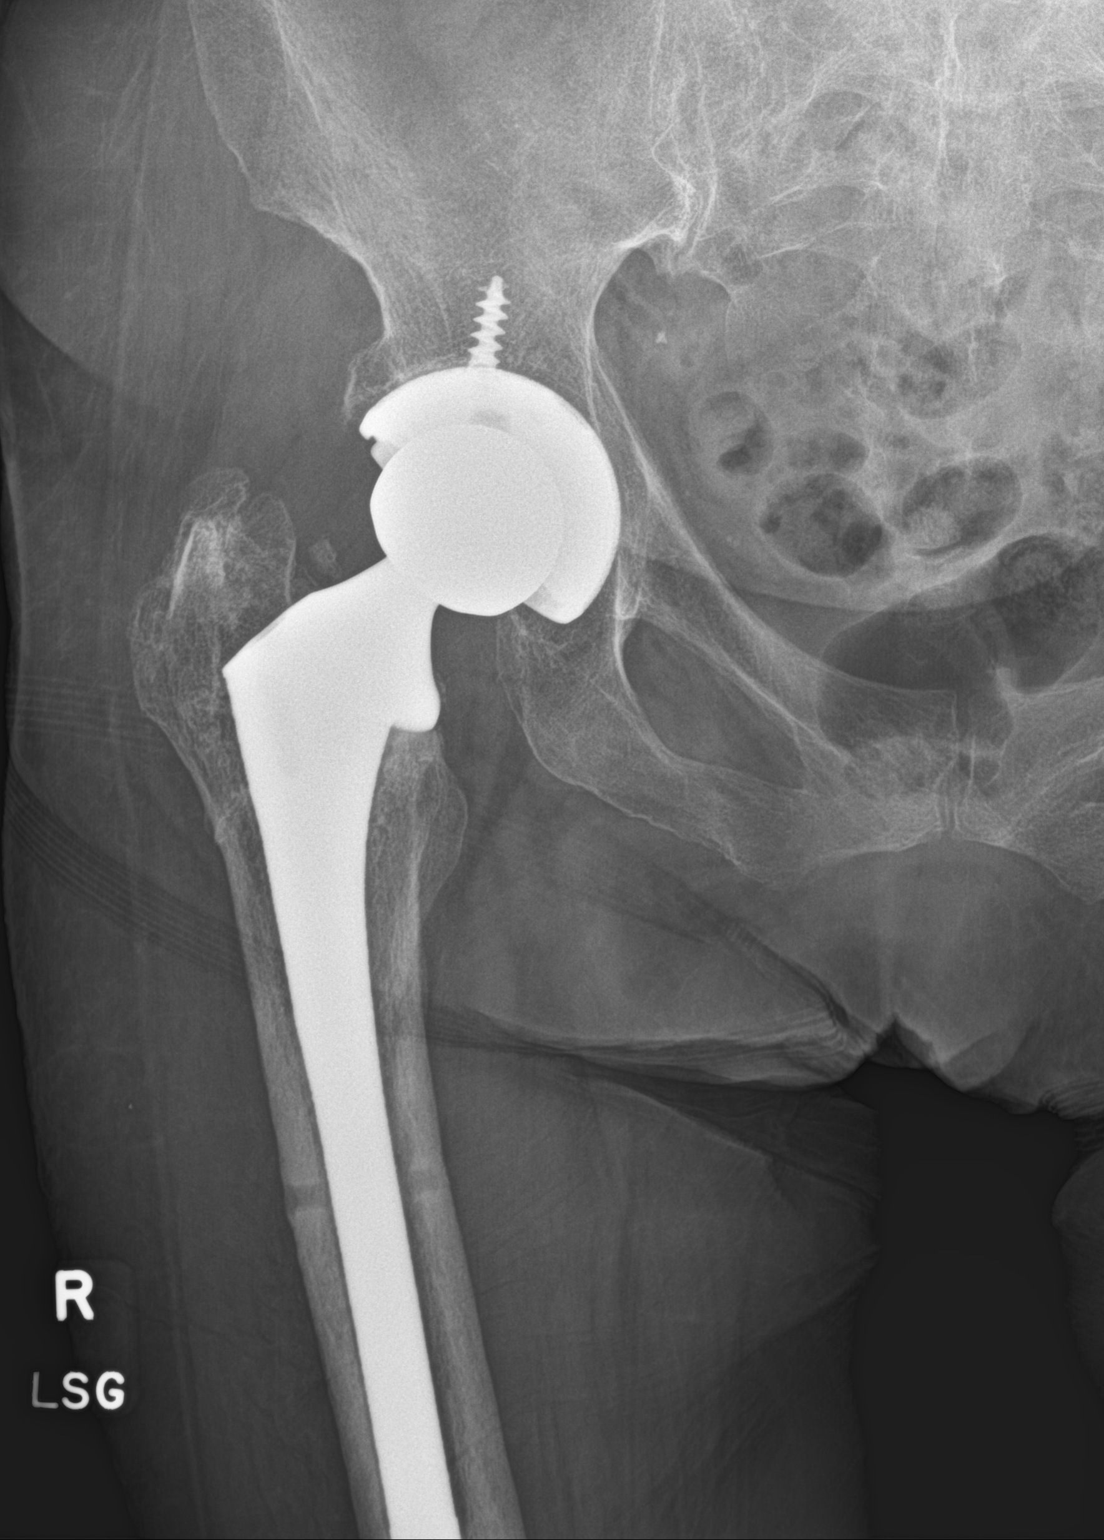

[hip lat]
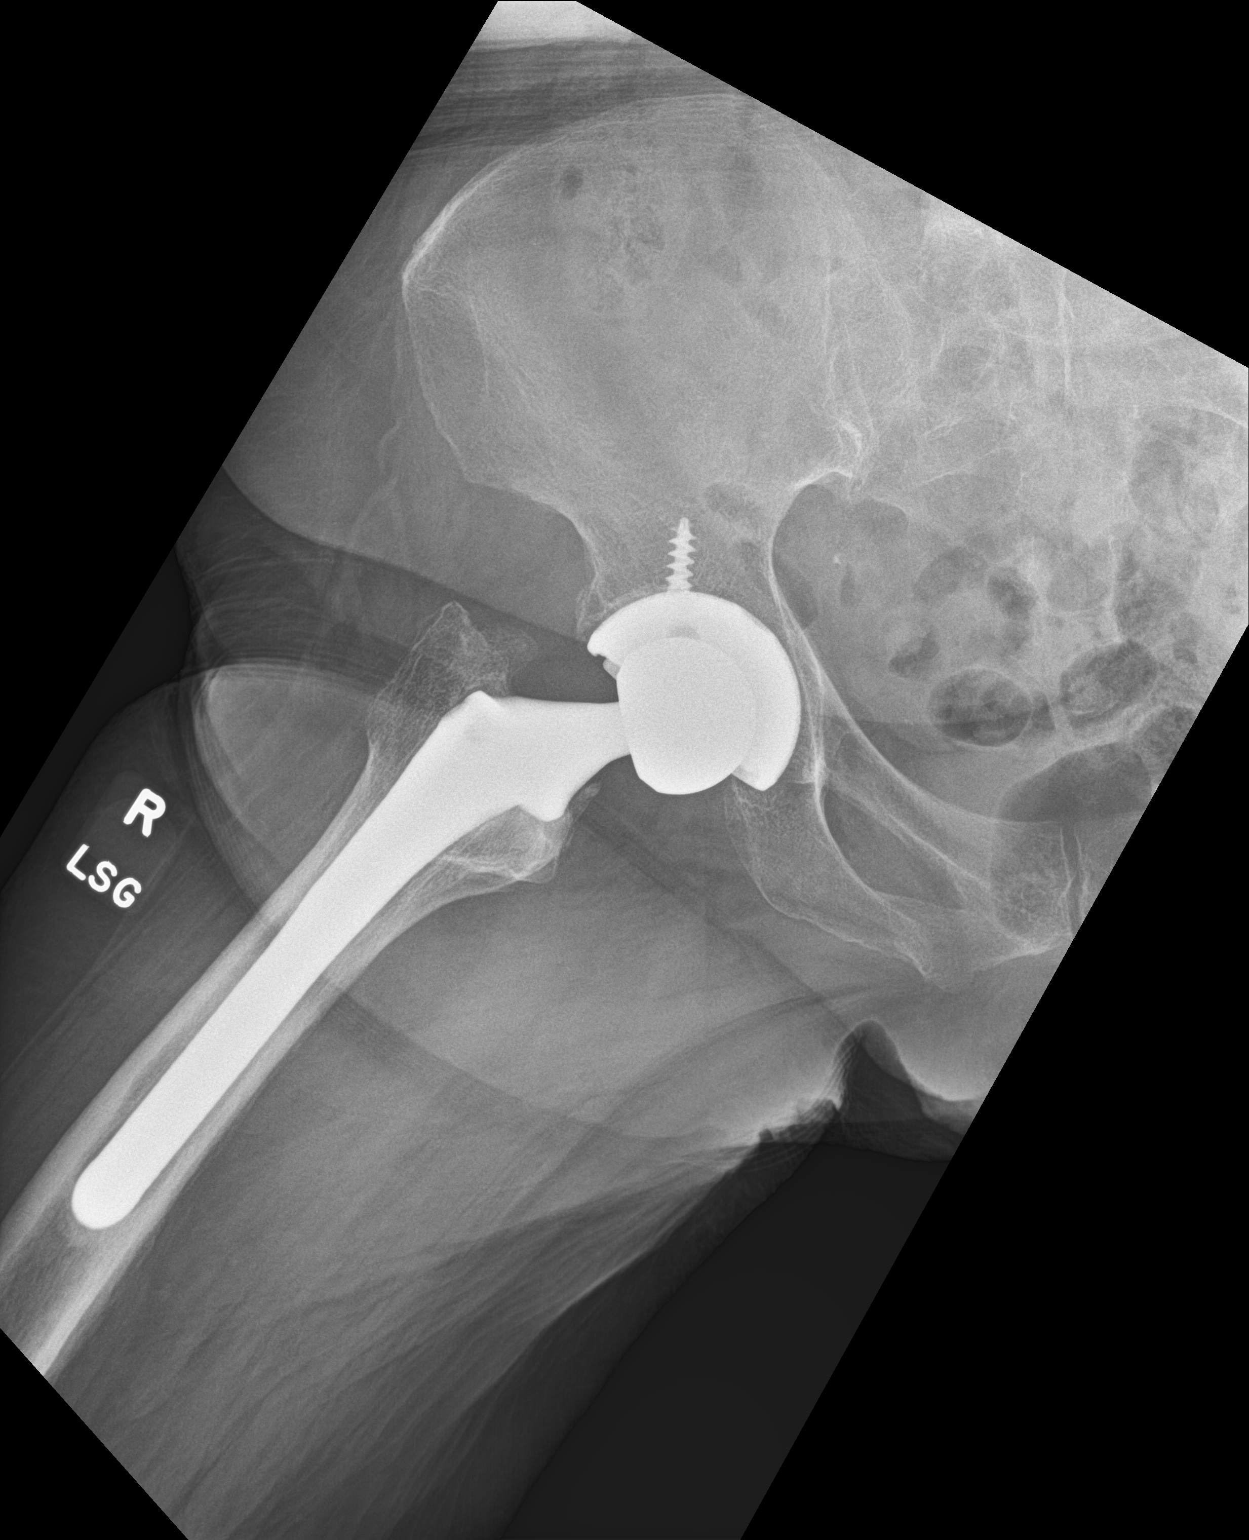

[3 of 3 positions shown; findings below may reference images not displayed]

FINDINGS: Pelvic ring is intact. Right hip replacement is again noted.
Degenerative changes of lumbar spine are noted. No acute fracture is
seen.
IMPRESSION: No acute abnormality noted.

## 2020-03-29 IMAGING — DX DG FEMUR 2+V*R*
4 series · 4 of 4 positions shown · non-contrast
Comparison: 09/13/2016

CLINICAL DATA: Right hip pain

EXAM:
RIGHT FEMUR 2 VIEWS

[femur lat (1 of 2)]
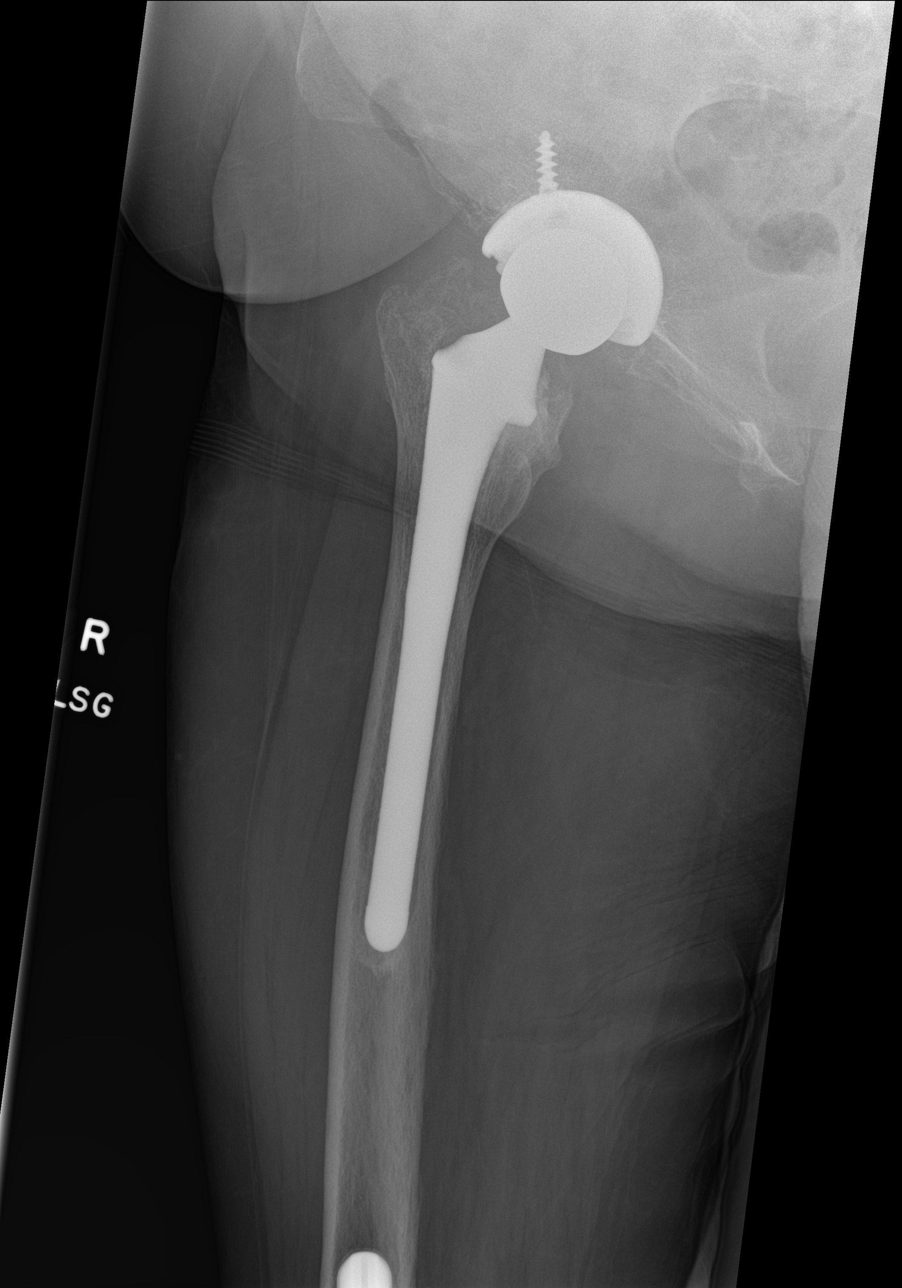

[femur lat (2 of 2)]
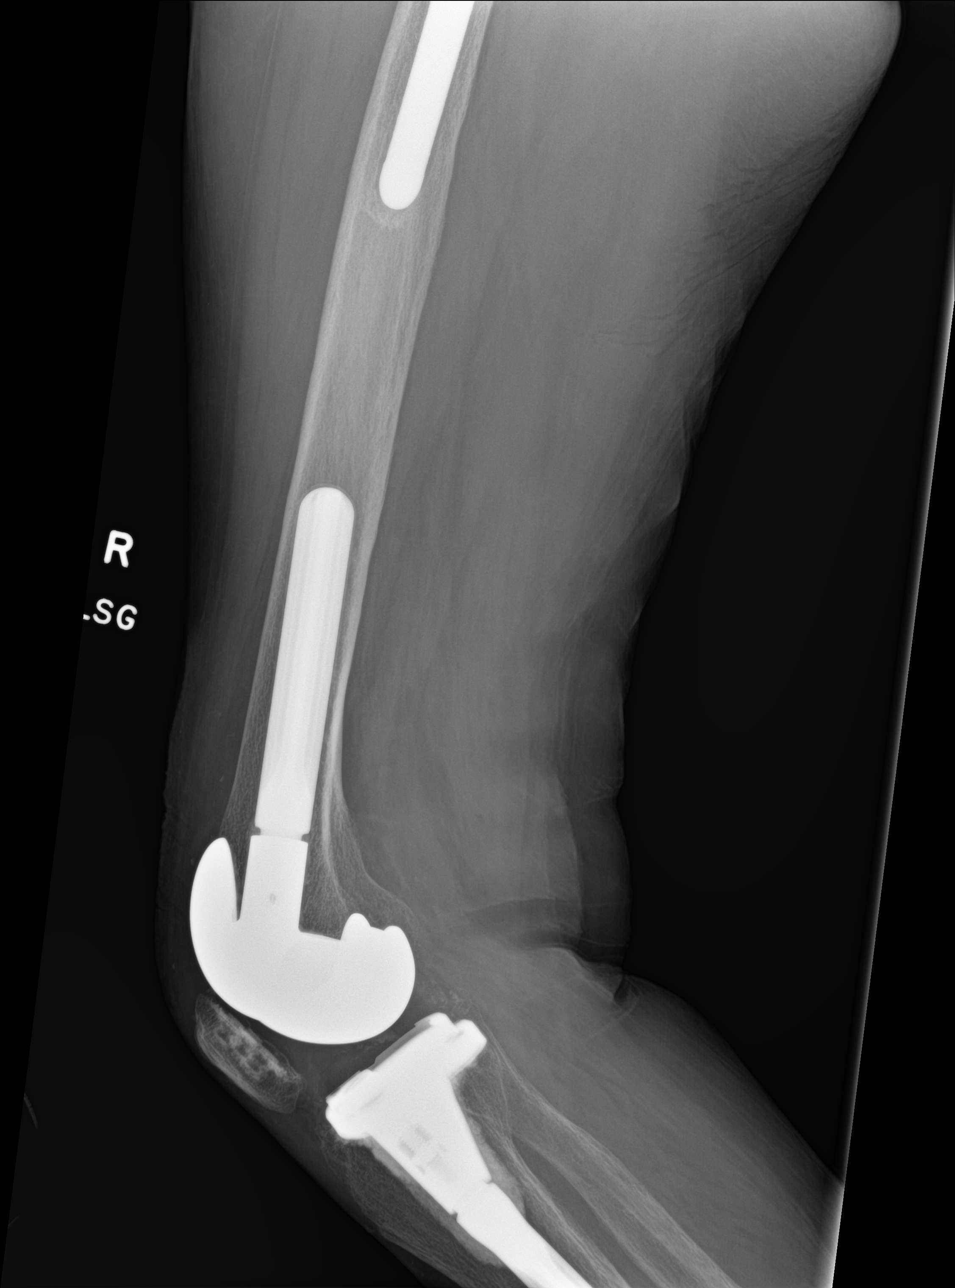

[femur ap (1 of 2)]
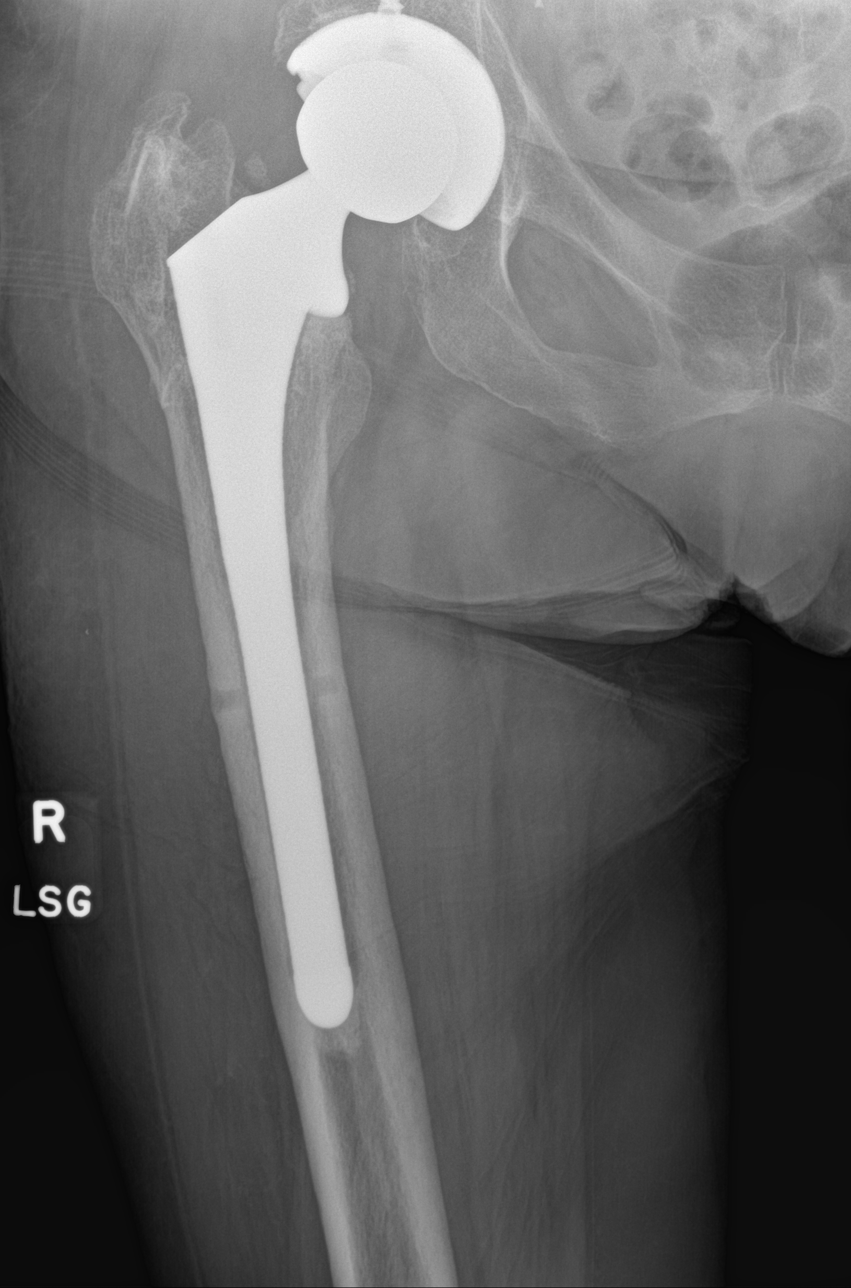

[femur ap (2 of 2)]
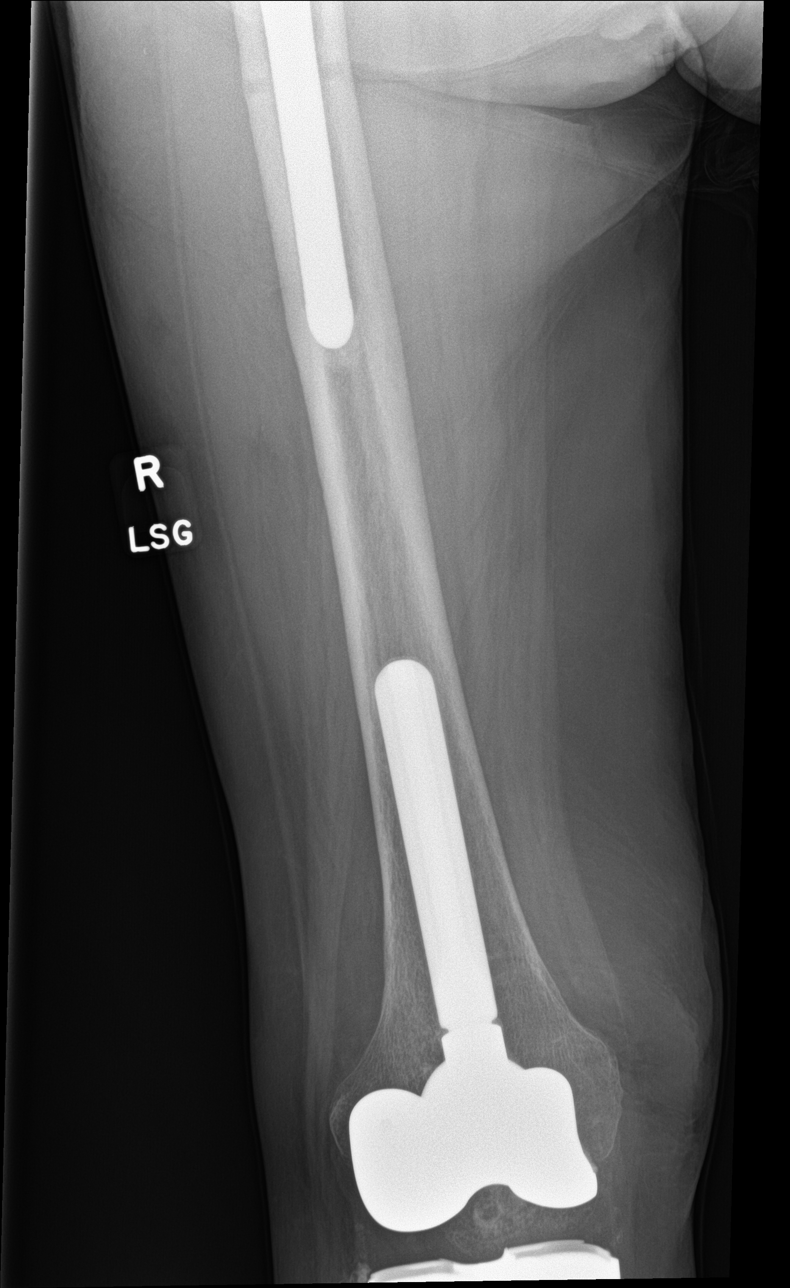

[4 of 4 positions shown; findings below may reference images not displayed]

FINDINGS: Right hip and right knee replacements are again seen and stable. No
acute fracture is noted. No soft tissue abnormality is noted.
IMPRESSION: No acute abnormality noted.

## 2020-03-29 MED ORDER — SODIUM CHLORIDE 0.9% FLUSH
10.0000 mL | Freq: Once | INTRAVENOUS | Status: AC
  Administered 2020-03-29: 10 mL via INTRAVENOUS
  Filled 2020-03-29: qty 10

## 2020-03-29 MED ORDER — POTASSIUM CHLORIDE IN NACL 20-0.9 MEQ/L-% IV SOLN
Freq: Once | INTRAVENOUS | Status: AC
  Filled 2020-03-29: qty 1000

## 2020-03-29 MED ORDER — ZAFIRLUKAST 20 MG PO TABS: 20.0000 mg | ORAL_TABLET | Freq: Two times a day (BID) | ORAL | 0 refills | Status: DC

## 2020-03-29 MED ORDER — HEPARIN SOD (PORK) LOCK FLUSH 100 UNIT/ML IV SOLN
INTRAVENOUS | Status: AC
  Filled 2020-03-29: qty 5

## 2020-03-29 MED ORDER — HEPARIN SOD (PORK) LOCK FLUSH 100 UNIT/ML IV SOLN
500.0000 [IU] | Freq: Once | INTRAVENOUS | Status: AC
  Administered 2020-03-29: 500 [IU] via INTRAVENOUS
  Filled 2020-03-29: qty 5

## 2020-03-29 NOTE — Progress Notes (Signed)
  Oncology Nurse Navigator Documentation  Navigator Location: [facility]-Med Onc (03/29/20 1100)   )Navigator Encounter Type: Follow-up Appt (03/29/20 1100)                     Patient Visit Type: MedOnc (03/29/20 1100) Treatment Phase: Active Tx (03/29/20 1100) Barriers/Navigation Needs: No Barriers At This Time (03/29/20 1100)   Interventions: None Required (03/29/20 1100)             met with patient and her daughter during follow up visit with Dr. Tasia Catchings. Per Dr. Tasia Catchings, pt will continue with radiation only without concurrent chemotherapy due to poor performance status. All questions answered during visit. Instructed pt to call with any further questions or needs. Pt and her daughter verbalized understanding.          Time Spent with Patient: 30 (03/29/20 1100)

## 2020-03-29 NOTE — Progress Notes (Signed)
Patient here for follow up. Daughter, Juliann Pulse, present with pt. Pt denies pain to port site. No redness or irritation visible. Pt went to ER last week and was put on Levofloxacin for UTI, she was told this would help if she had infection to port as well.

## 2020-03-29 NOTE — Progress Notes (Signed)
Hematology/Oncology follow up note [facility] [phone] [phone]   Patient Care Team: [name], FNP as PCP - General (Nurse Practitioner) Kate Sable, MD as PCP - Cardiology (Cardiology) [name], FNP (Nurse Practitioner) Telford Nab, RN as Oncology Nurse Navigator  REFERRING PROVIDER: [name], FNP  CHIEF COMPLAINTS/REASON FOR VISIT:  Follow-up for history of thrombosis, anticoagulation, and lung cancer  HISTORY OF PRESENTING ILLNESS:  [name] is a  79 y.o.  female with PMH listed below who was referred to me for evaluation of pulmonary embolism.  PE was diagnosed 04/30/2018, CT chest angiogram showed pulmonary embolism with right heart strain..  05/02/2018 US duplex lower extremities showed  Right: age indeterminate deep vein thrombosis involving the right posterior tibial vein, and right peroneal vein.  Left: acute deep vein thrombosis involving the left posterior tibial vein, and left peroneal vein.  She is on Eliquis 43m BID currently.  PE and DVT events occurred shortly after her hospitalization due to pneumonia.  Patient has chronic ambulation difficulty after her right hip arthroplasty  in May 2019. She subsequently had several complications immediately postoperative of fecal impaction and ileus.  This was done after an intertrochanteric hip fracture on the right side.She has had multiple ER visits over the past several months.  Also complains chronic lower extremities "soreness" for several month.     # 01/03/2020 patient had a CT chest angiogram done for evaluation of right upper quadrant and flank pain Study showed no acute PE.  Chronic pulmonary fibrosis with a new 3 cm rounded opacity in the superior segment of right lower lobe.  Superimposed new right lung pleural thickening. 01/25/2020, PET scan showed right lower lobe mass with SUV max of 18.5.  Hypermetabolic adenopathy within the  azygos esophageal recess, SUV of 16.2.  A low right paratracheal node with equivocal SUV activity. She presented emergency room on 01/31/2020 due to flank pain.  Had another CT chest angiogram done which showed no pulmonary embolism.   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  There was no metabolic activity on recent PET scan, favoring ampullary stenosis.  # 02/21/2020 S/p CT guided biopsy of  RLL mass.  Pathology showed  NSCLC, favor squamous cell carcinoma.   INTERVAL HISTORY Nicole FLITTONis a 79y.o. female who has above history reviewed by me today presents for follow up visit for management lung cancer management.  Problems and complaints are listed below: Patient was accompanied by her daughter. She has chronic history of anxiety and is on Xanax, Cymbalta.  Patient tolerates radiation therapy with moderate difficulties.  She has had multiple nausea and vomiting episodes and has received multiple hydration sessions and antiemetics.  Daughter reports that the Compazine has helped.  There is increased cough.  No fever or chills. Patient is currently on antibiotics for UTI. There was some concern about fullness and sensitivity around her Mediport. Patient remains to be very anxious  Review of Systems  Constitutional: Positive for fatigue. Negative for appetite change, chills and fever.  HENT:   Negative for hearing loss and voice change.   Eyes: Negative for eye problems.  Respiratory: Positive for cough and shortness of breath. Negative for chest tightness.   Cardiovascular: Negative for chest pain.  Gastrointestinal: Positive for nausea. Negative for  abdominal distention, abdominal pain and blood in stool.  Endocrine: Negative for hot flashes.  Genitourinary: Negative for  difficulty urinating and frequency.        Right hip pain  Musculoskeletal: Positive for arthralgias.  Skin: Negative for itching and rash.  Neurological: Negative for extremity weakness.  Hematological: Negative for adenopathy.  Psychiatric/Behavioral: Negative for confusion. The patient is nervous/anxious.     MEDICAL HISTORY:  Past Medical History:  Diagnosis Date  . Acute postoperative pain 02/03/2018  . Anemia   . B12 deficiency   . Bacteremia 10/07/2014  . Bladder incontinence   . Blind left eye   . Cancer ([place])    skin cancer   . Cataract   . Cervical spine fracture ([place])   . Chest pain 07/29/2013  . Compression fracture    C7,  upper T spine -compression fx  . COPD (chronic obstructive pulmonary disease) ([place])   . DDD (degenerative disc disease), lumbar   . Depression    major  . Diffuse myofascial pain syndrome 03/24/2015  . Dyspnea    at times- when activty  . Fever 10/05/2014  . GERD (gastroesophageal reflux disease)   . Hiatal hernia   . Hyperlipidemia   . Hypertension   . 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 Lung cancer ([place])   . Macular degeneration   . On home oxygen therapy    "3L; all the time" (04/30/2018)  . Osteoarthritis   . Osteoporosis   . Pneumonia    years ago  . Pulmonary embolism ([place]) 04/30/2018  . Reactive airway disease   . Rupture of bowel ([place])   . Sepsis ([place]) 10/08/2014  . Stroke [facility])    "mini stroke"- balance and memory issue  . Stroke ([place])   . Wrist pain, acute 09/10/2012    SURGICAL HISTORY: Past Surgical History:  Procedure Laterality Date  . ABDOMINAL HYSTERECTOMY    . ABDOMINAL SURGERY    . APPENDECTOMY    . BLADDER SURGERY    . CATARACT EXTRACTION W/ INTRAOCULAR LENS  IMPLANT, BILATERAL Bilateral   . CHOLECYSTECTOMY    . COLON SURGERY    . COLOSTOMY REVERSAL    . ESOPHAGOGASTRODUODENOSCOPY N/A 10/30/2018   Procedure: ESOPHAGOGASTRODUODENOSCOPY (EGD);  Surgeon: Lin Landsman, MD;  Location: [facility]  ENDOSCOPY;  Service: Gastroenterology;  Laterality: N/A;  . INTRAMEDULLARY (IM) NAIL INTERTROCHANTERIC Right 09/13/2016   Procedure: INTRAMEDULLARY (IM) NAIL INTERTROCHANTRIC RIGHT;  Surgeon: Leandrew Koyanagi, MD;  Location: [facility];  Service: Orthopedics;  Laterality: Right;  . JOINT REPLACEMENT Bilateral   . JOINT REPLACEMENT    . KNEE SURGERY    . OSTOMY    . PORTA CATH INSERTION N/A 03/15/2020   Procedure: PORTA CATH INSERTION;  Surgeon: Katha Cabal, MD;  Location: [facility] CV LAB;  Service: Cardiovascular;  Laterality: N/A;  . RECTOCELE REPAIR    . TOE SURGERY Right    3rd  . TOTAL HIP ARTHROPLASTY Right 09/30/2017   Procedure: RIGHT HIP IM NAIL REMOVAL WITH CONVERSION TO TOTAL HIP ARTHOPLASTY, anterior approach;  Surgeon: Renette Butters, MD;  Location: [facility];  Service: Orthopedics;  Laterality: Right;    SOCIAL HISTORY: Social History   Socioeconomic History  . Marital status: Widowed    Spouse name: Not on file  . Number of children: 3  . Years of education: middle sch  . Highest education level: Not on file  Occupational History  . Occupation: retired    Comment: n/a  Tobacco Use  .  Smoking status: Former Smoker    Packs/day: 1.00    Years: 30.00    Pack years: 30.00    Types: Cigarettes    Quit date: 1990    Years since quitting: 31.8  . Smokeless tobacco: Never Used  Vaping Use  . Vaping Use: Former  Substance and Sexual Activity  . Alcohol use: Not Currently  . Drug use: Not Currently  . Sexual activity: Not Currently  Other Topics Concern  . Not on file  Social History Narrative   ** Merged History Encounter **       ** Merged History Encounter **       Patient lives at home with daughter. Caffeine Use: 4 cups daily   Social Determinants of Health   Financial Resource Strain: Low Risk   . Difficulty of Paying Living Expenses: Not hard at all  Food Insecurity: No Food Insecurity  . Worried About Charity fundraiser in the Last Year: Never  true  . Ran Out of Food in the Last Year: Never true  Transportation Needs: No Transportation Needs  . Lack of Transportation (Medical): No  . Lack of Transportation (Non-Medical): No  Physical Activity: Inactive  . Days of Exercise per Week: 0 days  . Minutes of Exercise per Session: 0 min  Stress: Stress Concern Present  . Feeling of Stress : Very much  Social Connections:   . Frequency of Communication with Friends and Family: Not on file  . Frequency of Social Gatherings with Friends and Family: Not on file  . Attends Religious Services: Not on file  . Active Member of Clubs or Organizations: Not on file  . Attends Archivist Meetings: Not on file  . Marital Status: Not on file  Intimate Partner Violence: Not At Risk  . Fear of Current or Ex-Partner: No  . Emotionally Abused: No  . Physically Abused: No  . Sexually Abused: No    FAMILY HISTORY: Family History  Problem Relation Age of Onset  . Heart failure Mother   . Heart attack Father   . Lung cancer Sister   . Deep vein thrombosis Sister   . Pulmonary embolism Son   . Deep vein thrombosis Son   . Cervical cancer Daughter     ALLERGIES:  is allergic to ampicillin, ampicillin-sulbactam sodium, ambien [zolpidem tartrate], cyclobenzaprine, tape, toradol [ketorolac tromethamine], sulfamethoxazole-trimethoprim, cephalexin, and tramadol.  MEDICATIONS:  Current Outpatient Medications  Medication Sig Dispense Refill  . acetaminophen (TYLENOL) 500 MG tablet Take 2 tablets (1,000 mg total) by mouth in the morning, at noon, and at bedtime. (Patient taking differently: Take 1,000 mg by mouth in the morning and at bedtime. )    . albuterol (ACCUNEB) 1.25 MG/3ML nebulizer solution Take 3 mLs (1.25 mg total) by nebulization every 6 (six) hours as needed for wheezing. 75 mL 1  . albuterol (VENTOLIN HFA) 108 (90 Base) MCG/ACT inhaler Inhale 2 puffs into the lungs every 6 (six) hours as needed for wheezing or shortness of  breath. 1 Inhaler 2  . alendronate (FOSAMAX) 70 MG tablet TAKE 1 TABLET BY MOUTH ONCE A WEEK (Patient taking differently: Take 70 mg by mouth every Sunday. ) 12 tablet 1  . ALPRAZolam (XANAX) 1 MG tablet TAKE 0.5-1 TABLETS (0.5-1 MG TOTAL) BY MOUTH 2 (TWO) TIMES DAILY AS NEEDED FOR ANXIETY. 60 tablet 0  . amLODipine (NORVASC) 5 MG tablet TAKE 1 TABLET BY MOUTH EVERY DAY (Patient taking differently: Take 5 mg by mouth daily. )  90 tablet 1  . Cyanocobalamin (VITAMIN B-12) 2500 MCG SUBL Take 2,500 mcg by mouth daily.     Marland Kitchen docusate sodium (COLACE) 100 MG capsule Take 100 mg by mouth daily.     . DULoxetine (CYMBALTA) 60 MG capsule Take 1 capsule (60 mg total) by mouth daily. 90 capsule 1  . ELIQUIS 2.5 MG TABS tablet TAKE 1 TABLET BY MOUTH TWICE A DAY (Patient taking differently: Take 2.5 mg by mouth in the morning and at bedtime. ) 60 tablet 3  . esomeprazole (NEXIUM) 40 MG capsule TAKE 1 CAPSULE BY MOUTH EVERY DAY 90 capsule 1  . fentaNYL (DURAGESIC) 25 MCG/HR Place 1 patch onto the skin every 3 (three) days.    . Ferrous Gluconate (IRON 27 PO) Take 27 mg by mouth 2 (two) times a week.    Marland Kitchen FLOVENT HFA 220 MCG/ACT inhaler TAKE 1 PUFF BY MOUTH TWICE A DAY (Patient taking differently: Inhale 1 puff into the lungs 2 (two) times daily as needed. ) 24 Inhaler 5  . fluticasone (FLONASE) 50 MCG/ACT nasal spray SPRAY 2 SPRAYS INTO EACH NOSTRIL EVERY DAY (Patient taking differently: Place 2 sprays into both nostrils daily as needed. ) 48 mL 1  . levofloxacin (LEVAQUIN) 500 MG tablet Take 1 tablet (500 mg total) by mouth daily for 10 days. 7 tablet 0  . lidocaine-prilocaine (EMLA) cream Apply 1 application topically as needed. Apply to port and cover with saran wrap 1-2 hours prior to port access 30 g 1  . LINZESS 290 MCG CAPS capsule TAKE 1 CAPSULE BY MOUTH EVERY DAY 90 capsule 1  . lovastatin (MEVACOR) 10 MG tablet TAKE 1 TABLET BY MOUTH EVERY DAY (Patient taking differently: Take 10 mg by mouth daily. ) 90  tablet 0  . mirtazapine (REMERON) 7.5 MG tablet TAKE 1 TABLET (7.5 MG TOTAL) BY MOUTH AT BEDTIME. 90 tablet 1  . ondansetron (ZOFRAN ODT) 4 MG disintegrating tablet Take 1 tablet (4 mg total) by mouth every 8 (eight) hours as needed for nausea or vomiting. 30 tablet 0  . orphenadrine (NORFLEX) 100 MG tablet Take 100 mg by mouth 2 (two) times daily as needed for muscle spasms. 1/4 every 2 days    . Oxycodone HCl 10 MG TABS Take 10 mg by mouth every 8 (eight) hours as needed (pain.).     Marland Kitchen polyethylene glycol (MIRALAX / GLYCOLAX) packet Take 17 g by mouth daily.     . potassium chloride (KLOR-CON) 10 MEQ tablet Take 1 tablet (10 mEq total) by mouth 2 (two) times daily. 30 tablet 0  . pregabalin (LYRICA) 100 MG capsule Take 1 capsule (100 mg total) by mouth 2 (two) times daily. 180 capsule 0  . prochlorperazine (COMPAZINE) 10 MG tablet Take 1 tablet (10 mg total) by mouth every 6 (six) hours as needed (Nausea or vomiting). 30 tablet 1  . promethazine (PHENERGAN) 12.5 MG tablet Take 1 tablet (12.5 mg total) by mouth every 8 (eight) hours as needed for nausea or vomiting. 30 tablet 1  . Multiple Minerals-Vitamins (CAL MAG ZINC +D3 PO) Take 1 tablet by mouth daily. (Patient not taking: Reported on 03/29/2020)    . NARCAN 4 MG/0.1ML LIQD nasal spray kit Place 1 spray into the nose daily as needed (accidental overdose).  (Patient not taking: Reported on 03/29/2020)    . zafirlukast (ACCOLATE) 20 MG tablet Take 1 tablet (20 mg total) by mouth 2 (two) times daily before a meal. 60 tablet 0   No  current facility-administered medications for this visit.     PHYSICAL EXAMINATION: ECOG PERFORMANCE STATUS: 3 - Symptomatic, >50% confined to bed Vitals:   03/29/20 0857  BP: 121/81  Pulse: (!) 109  Temp: (!) 96.4 F (35.8 C)  SpO2: 97%   Filed Weights   03/29/20 0857  Weight: 163 lb 6.4 oz (74.1 kg)    Physical Exam Constitutional:      General: She is not in acute distress.    Comments: Sits in  wheelchair  Eyes:     General: No scleral icterus.    Pupils: Pupils are equal, round, and reactive to light.  Cardiovascular:     Rate and Rhythm: Normal rate and regular rhythm.     Heart sounds: Normal heart sounds.  Pulmonary:     Effort: Pulmonary effort is normal. No respiratory distress.     Breath sounds: No wheezing.     Comments: Bibasilar crackles Abdominal:     General: Bowel sounds are normal. There is no distension.     Palpations: Abdomen is soft. There is no mass.     Tenderness: There is no abdominal tenderness.  Musculoskeletal:        General: Swelling present. No deformity. Normal range of motion.     Cervical back: Normal range of motion and neck supple.  Skin:    General: Skin is warm and dry.     Findings: No erythema or rash.  Neurological:     Mental Status: She is alert. Mental status is at baseline.     Cranial Nerves: No cranial nerve deficit.     Coordination: Coordination normal.  Psychiatric:     Comments: anxious      LABORATORY DATA:  I have reviewed the data as listed Lab Results  Component Value Date   WBC 6.3 03/29/2020   HGB 9.9 (L) 03/29/2020   HCT 31.7 (L) 03/29/2020   MCV 85.0 03/29/2020   PLT 333 03/29/2020   Recent Labs    01/31/20 1155 01/31/20 1155 02/01/20 0947 02/01/20 0947 02/03/20 2050 03/06/20 1412 03/20/20 0831 03/20/20 0831 03/23/20 1145 03/24/20 1152 03/29/20 0843  NA 140   < > 140   < > 139   < > 138   < > 137 136 137  K 2.9*   < > 3.1*   < > 3.1*   < > 2.9*   < > 3.3* 2.9* 3.0*  CL 102   < > 104   < > 101   < > 101   < > 101 100 102  CO2 30   < > 28   < > 26   < > 28   < > _0 GLUCOSE 110*   < > 124*   < > 111*   < > 97   < > 94 117* 111*  BUN 15   < > 15   < > 14   < > 7*   < > 7* 8 7*  CREATININE 0.90   < > 0.78   < > 0.96   < > 1.16*   < > 0.95 1.08* 1.01*  CALCIUM 9.3   < > 9.2   < > 9.3   < > 9.1   < > 8.8* 9.1 9.0  GFRNONAA >60   < > >60   < > 57*   < > 48*   < > >60 53* 57*  GFRAA >60  --   >60  --  >  60  --   --   --   --   --   --   PROT 7.6   < > 7.8   < > 7.6  --  8.1  --  8.3*  --  7.8  ALBUMIN 2.9*   < > 3.0*   < > 2.8*  --  2.8*  --  2.8*  --  2.6*  AST 72*   < > 37   < > 25  --  64*  --  20  --  20  ALT 24   < > 20   < > 16  --  35  --  17  --  9  ALKPHOS 129*   < > 131*   < > 103  --  151*  --  114  --  83  BILITOT 0.4   < > 0.4   < > 0.4  --  0.5  --  0.4  --  0.2*  BILIDIR <0.1  --   --   --  <0.1  --   --   --   --   --   --   IBILI NOT CALCULATED  --   --   --  NOT CALCULATED  --   --   --   --   --   --    < > = values in this interval not displayed.   Iron/TIBC/Ferritin/ %Sat    Component Value Date/Time   IRON 49 02/01/2020 0947   TIBC 326 02/01/2020 0947   FERRITIN 11 02/01/2020 0947   IRONPCTSAT 15 02/01/2020 0947      RADIOGRAPHIC STUDIES: I have personally reviewed the radiological images as listed and agreed with the findings in the report. DG Chest 1 View  Result Date: 02/03/2020 CLINICAL DATA:  Right-sided chest pain. Recent lung cancer diagnosis. EXAM: CHEST  1 VIEW COMPARISON:  Radiograph and CT 3 days ago 01/31/2020 FINDINGS: Stable heart size and mediastinal contours. Rounded density projecting over the medial right lung base corresponds to lung mass on CT. No acute airspace disease. No developing pleural effusion. No pneumothorax. Chronic interstitial coarsening. Stable osseous structures. IMPRESSION: 1. Stable radiographic appearance of the chest. 2. Rounded density projecting over the medial right lung base corresponds to lung mass on CT. Electronically Signed   By: Keith Rake M.D.   On: 02/03/2020 21:14   DG Chest 2 View  Result Date: 01/31/2020 CLINICAL DATA:  Right-sided chest pain. EXAM: CHEST - 2 VIEW COMPARISON:  Chest CT January 03, 2020 FINDINGS: Cardiomediastinal silhouette is normal. Mediastinal contours appear intact. Tortuosity of the aorta. Right lower lobe lung mass is not changed radiographically. Coarsening of the  interstitium. Osseous structures are without acute abnormality. Soft tissues are grossly normal. IMPRESSION: 1. Right lower lobe lung mass is not changed radiographically. 2. Coarsening of the interstitium. Electronically Signed   By: Fidela Salisbury M.D.   On: 01/31/2020 12:28   DG Chest 2 View  Result Date: 01/02/2020 CLINICAL DATA:  Lower right chest pain. EXAM: CHEST - 2 VIEW COMPARISON:  July 15, 2019 FINDINGS: Mild, diffuse chronic appearing increased interstitial lung markings are noted. This is slightly more prominent within the bilateral lower lobes. There is no evidence of acute infiltrate, pleural effusion or pneumothorax. The cardiac silhouette is mildly enlarged. There is tortuosity of the descending thoracic aorta. The visualized skeletal structures are unremarkable. IMPRESSION: Chronic appearing increased interstitial lung markings without evidence of acute or active cardiopulmonary disease.  Electronically Signed   By: Virgina Norfolk M.D.   On: 01/02/2020 22:18   DG Ribs Unilateral W/Chest Right  Result Date: 01/17/2020 CLINICAL DATA:  Right rib pain EXAM: RIGHT RIBS AND CHEST - 3+ VIEW COMPARISON:  01/02/2020 FINDINGS: Heart is normal size. Calcified granuloma in the left upper lobe. Diffuse interstitial prominence within the lungs, stable. No effusions or pneumothorax. No acute bony abnormality. No visible rib fracture. IMPRESSION: No visible displaced rib fracture. Diffuse interstitial prominence within the lungs likely reflects chronic interstitial lung disease. Electronically Signed   By: Rolm Baptise M.D.   On: 01/17/2020 22:47   CT Angio Chest PE W and/or Wo Contrast  Result Date: 03/24/2020 CLINICAL DATA:  Lung cancer with increasing left lower chest pain, worse with coughing, recent radiation therapy EXAM: CT ANGIOGRAPHY CHEST WITH CONTRAST TECHNIQUE: Multidetector CT imaging of the chest was performed using the standard protocol during bolus administration of  intravenous contrast. Multiplanar CT image reconstructions and MIPs were obtained to evaluate the vascular anatomy. CONTRAST:  67m OMNIPAQUE IOHEXOL 350 MG/ML SOLN COMPARISON:  CT 01/31/2020, PET-CT 01/25/2020 FINDINGS: Cardiovascular: Satisfactory opacification the pulmonary arteries to the segmental level. No pulmonary artery filling defects are identified. Central pulmonary arteries are borderline enlarged though stable from prior in keeping with chronic pulmonary arterial hypertension. Cardiac size is top normal. No pericardial effusion. Coronary artery atherosclerosis. Calcifications of the aortic leaflets. Atherosclerotic plaque within the normal caliber aorta. No acute luminal abnormality of the imaged aorta. No periaortic stranding or hemorrhage. Normal 3 vessel branching of the aortic arch. Proximal great vessels are calcified but otherwise unremarkable. Right IJ approach Port-A-Cath tip terminates in the lower SVC. No other major venous abnormality. Mediastinum/Nodes: Redemonstration of an enlarged right hilar/azygoesophageal lymph node (4/41 measuring up to 14 mm short axis. Similar to comparison. Additional enlarged 11 mm precarinal lymph node (4/34) is also unchanged from prior. No new or significantly enlarged adenopathy is evident with other shotty mediastinal and hilar nodes. No mediastinal fluid or gas. Normal thyroid gland and thoracic inlet. No acute abnormality of the trachea or esophagus. Lungs/Pleura: Redemonstration of a pleural based mass in the posterior segment right lower lobe measuring 4.1 by 2.9 cm, minimally increased in size from prior which may be at least partially attributable with some adjacent passive atelectasis. Central necrosis and cavitation is again seen. There is no clear extension into the chest wall however there may be trace pleural fluid and pleural thickening. Additional calcified nodules in the posterior left upper lobe unchanged. No new concerning pulmonary nodules  or masses are seen. There is some mild interlobular septal thickening and vascular cephalization which could reflect at least mild interstitial edema. Background of subpleural reticular fibrotic changes accentuated by atelectasis. Upper Abdomen: Per chart, patient is post cholecystectomy. There is intra and extrahepatic biliary ductal dilatation which is similar to prior may be related to senescent change and post cholecystectomy reservoir effect however a possible periampullary mass was identified on comparison. Hypoattenuating probable cyst seen in the upper left kidney. Multiple splenic calcifications are compatible with splenic granulomata. Indeterminate nodular thickening of the left adrenal gland. Musculoskeletal: Multilevel degenerative changes are present in the imaged portions of the spine. Some chronic upper thoracic compression deformities are noted. No worrisome chest wall lesion or chest wall involvement of the right lower lobe mass. Review of the MIP images confirms the above findings. IMPRESSION: 1. No evidence of acute pulmonary embolism. 2. Chronic central pulmonary artery enlargement compatible with pulmonary artery hypertension. 3. Atelectatic  changes and some likely some mild interstitial edema in the lungs. 4. 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 5. Slight interval increase in size of the necrotic pleural based mass in the posterior segment right lower lobe, now measuring up to 4.1 cm, may be related to some increasing atelectatic changes in the immediate vicinity. No clear extension into the chest wall however there may be trace pleural fluid and pleural thickening. Recommend close attention on follow-up. 6. Stable nodal disease in the right hilum and mediastinum. 7. Indeterminate nodular thickening of the left adrenal gland, while this was previously PET negative, do recommend continued attention on follow-up imaging. 8. Stable intra and extrahepatic biliary ductal  dilatation, possibly related to senescent change and post cholecystectomy reservoir effect however a possible periampullary mass was identified on comparison though without demonstrable PET avidity on more remote comparison. Correlate with symptoms and serologies, and could consider further evaluation with direct visualization as recommended previously. 9. Aortic Atherosclerosis (ICD10-I70.0). Electronically Signed   By: Lovena Le M.D.   On: 03/24/2020 15:14   CT Angio Chest PE W and/or Wo Contrast  Result Date: 01/31/2020 CLINICAL DATA:  Lung cancer, chest pain EXAM: CT ANGIOGRAPHY CHEST WITH CONTRAST TECHNIQUE: Multidetector CT imaging of the chest was performed using the standard protocol during bolus administration of intravenous contrast. Multiplanar CT image reconstructions and MIPs were obtained to evaluate the vascular anatomy. CONTRAST:  38mL OMNIPAQUE IOHEXOL 350 MG/ML SOLN COMPARISON:  01/03/2020 FINDINGS: Cardiovascular: Excellent opacification of the pulmonary arterial tree. No intraluminal filling defect identified to suggest acute pulmonary embolism. The central pulmonary arteries are mildly enlarged in keeping with changes of pulmonary arterial hypertension, stable since prior examination. Cardiac size is within normal limits. No pericardial effusion. The thoracic aorta is of normal caliber with mild atherosclerotic calcification noted within its descending segment. Mediastinum/Nodes: Pathologic as ago esophageal recess node is again identified, stable at 13 mm x 16 mm at axial image 40/3 shotty right paratracheal and precarinal adenopathy is again identified without frank pathologic enlargement. Thyroid unremarkable. Esophagus unremarkable. Lungs/Pleura: The pleural based right posterior basal lower lobe pulmonary mass is again identified and is unchanged measuring 2.7 x 3.9 cm in greatest dimension at axial image # 47/5. Central necrosis with a small amount of intraluminal gas is again  identified. The mass abuts the pleural margin, however, there is no definite extension into the chest wall itself. No pneumothorax or pleural effusion. Mild subpleural pulmonary fibrosis is again identified diffusely. No new focal pulmonary infiltrate. Central airways are widely patent. Upper Abdomen: Multiple simple cortical cysts are seen within the left kidney. There is surgical changes of cholecystectomy identified with moderate to severe extrahepatic biliary ductal dilation. 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 This may represent bulbous distension of the ampulla into the duodenal lumen as can be seen with ampullary stenosis or a primary neoplasm of the ampulla. The lack of significant metabolic activity on prior PET CT examination favors the former. Musculoskeletal: No acute bone abnormality. Review of the MIP images confirms the above findings. IMPRESSION: 1. No evidence of acute pulmonary embolism. 2. Stable size and appearance of right lower lobe pulmonary mass and associated pathologic azygoesophageal recess node. 3. Moderate to severe extrahepatic biliary ductal dilation with an ampullary mass better identified on the current examination. This may represent bulbous distension of the ampulla into the duodenal lumen as can be seen with  ampullary stenosis or a primary neoplasm of the ampulla. The lack of significant metabolic activity on prior PET CT examination favors the former. Correlation with endoscopy would be helpful for further evaluation. Aortic Atherosclerosis (ICD10-I70.0). Electronically Signed   By: Fidela Salisbury MD   On: 01/31/2020 16:30   CT Angio Chest PE W and/or Wo Contrast  Result Date: 01/03/2020 CLINICAL DATA:  79 year old female with chest and abdominal pain. Right upper quadrant and flank pain since 0800 hours. EXAM: CT ANGIOGRAPHY CHEST WITH CONTRAST TECHNIQUE:  Multidetector CT imaging of the chest was performed using the standard protocol during bolus administration of intravenous contrast. Multiplanar CT image reconstructions and MIPs were obtained to evaluate the vascular anatomy. CONTRAST:  171m OMNIPAQUE IOHEXOL 350 MG/ML SOLN COMPARISON:  CTA chest 05/17/2018. CT Abdomen and Pelvis today reported separately. FINDINGS: Cardiovascular: Excellent contrast bolus timing in the pulmonary arterial tree. No focal filling defect identified in the pulmonary arteries to suggest acute pulmonary embolism. Calcified coronary artery atherosclerosis. Cardiac size remains within normal limits. No pericardial effusion. Negative visible aorta aside from atherosclerosis. Mediastinum/Nodes: Stable mediastinal lymph nodes since 2019, such as due to chronic or recurrent pulmonary inflammation. Lungs/Pleura: Lower lung volumes. Chronic pulmonary fibrosis. Major airways remain patent. New right lung pleural thickening or trace pleural effusion with superimposed rounded roughly 3 cm superior segment lower lobe opacity with some internal gas or cavitation (series 4, image 60), new since 2019. But stable lung opacity elsewhere. Upper Abdomen: Negative visible liver, spleen (calcified granulomas), pancreas, adrenal glands, kidneys and bowel in the upper abdomen. Musculoskeletal: Multilevel upper thoracic and lower cervical compression fractures appear stable. No acute osseous abnormality identified. Review of the MIP images confirms the above findings. IMPRESSION: 1. No evidence of acute pulmonary embolus. 2. Chronic Pulmonary Fibrosis with a new 3 cm rounded opacity in the superior segment right lower lobe since 2019. Superimposed new right lung pleural thickening or trace pleural fluid. This is indeterminate for pneumonia versus mass. Recommend one of the following: (a) repeat chest CT in 3 months, (b) follow-up PET-CT, or (c) tissue sampling. This recommendation adapted from the consensus  statement: Guidelines for Management of Incidental Pulmonary Nodules Detected on CT Images: From the Fleischner Society 2017; Radiology 2017; 284:228-243. 3.  CT Abdomen, and Pelvis today are reported separately. 4. Calcified coronary artery atherosclerosis. Aortic Atherosclerosis (ICD10-I70.0). Electronically Signed   By: HGenevie AnnM.D.   On: 01/03/2020 00:58   MR Brain W Wo Contrast  Result Date: 03/05/2020 CLINICAL DATA:  New diagnosis squamous cell carcinoma of the lung. Staging. EXAM: MRI HEAD WITHOUT AND WITH CONTRAST TECHNIQUE: Multiplanar, multiecho pulse sequences of the brain and surrounding structures were obtained without and with intravenous contrast. CONTRAST:  7.536mGADAVIST GADOBUTROL 1 MMOL/ML IV SOLN COMPARISON:  Head CT 08/28/2019.  Brain MRI 06/11/2013. FINDINGS: Brain: Diffusion imaging does not show any acute or subacute infarction. Mild chronic small-vessel change affects the pons. 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 Mild chronic small-vessel ischemic changes of the cerebral hemispheric deep white matter. No cortical or large vessel territory infarction. After contrast administration, no abnormal enhancement occurs. No sign of metastatic disease. Vascular: Major vessels at the base of the brain show flow. Skull and upper cervical spine: Negative Sinuses/Orbits: Clear/normal Other: None IMPRESSION: 1. No evidence of metastatic disease. 2. Mild chronic small-vessel ischemic changes as outlined above. Electronically Signed   By: MaNelson Chimes.D.   On: 03/05/2020 05:48   CT ABDOMEN PELVIS W CONTRAST  Result  Date: 01/03/2020 CLINICAL DATA:  79 year old female with chest and abdominal pain. Right upper quadrant and flank pain since 0800 hours. EXAM: CT ABDOMEN AND PELVIS WITH CONTRAST TECHNIQUE: Multidetector CT imaging of the abdomen and pelvis was performed using the standard protocol following bolus administration of intravenous contrast. CONTRAST:  158m OMNIPAQUE IOHEXOL  350 MG/ML SOLN COMPARISON:  CTA chest today reported separately. CT Abdomen and Pelvis 06/18/2019 FINDINGS: Lower chest: On these images there is evidence of both pleural thickening with enhancement and intervening trace pleural fluid in the posterior right hemithorax (series 5, image 2), new since January. Some of the rounded right lower lobe superior segment opacity is also visible on these images, see chest CTA for additional details. Hepatobiliary: Chronically absent gallbladder. Intra and extrahepatic biliary ductal dilatation with CBD measuring up to 15 mm diameter, although this appears similar to the January CT. However, the CBD has enlarged since 2019 (up to 12 mm at that time. No filling defect identified within the duct. Liver enhancement remains within normal limits. Pancreas: Stable, negative.  No pancreatic ductal enlargement. Spleen: Stable calcified splenic granulomas. Adrenals/Urinary Tract: Normal adrenal glands. Both kidneys appear stable since 2019 with symmetric renal enhancement and contrast excretion. Normal proximal ureters. 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 There is abundant gas within the urinary bladder on series 5, image 79. Mild bladder wall thickening appears stable. No perivesical stranding is evident. Stomach/Bowel: Redundant large bowel with mild retained stool throughout. Diverticulosis of the sigmoid colon with no active inflammation evident. Decompressed terminal ileum. Diminutive or absent appendix. No dilated small bowel. Decompressed stomach and duodenum. No free air, free fluid. Vascular/Lymphatic: Aortoiliac calcified atherosclerosis. Major arterial structures remain patent. Portal venous system is patent. No lymphadenopathy. Reproductive: Diminutive or absent as before. Other: Streak artifact in the pelvis, no pelvis free fluid. Musculoskeletal: Osteopenia. Chronic right hip  arthroplasty. No acute osseous abnormality identified. IMPRESSION: 1. On these images there is evidence of pleural thickening and trace pleural fluid in the right lower lobe, new since January this year, and near the abnormal superior segment opacity described on Chest CTA today. This would seem to favor infection over tumor. 2. Chronic but increased intra- and extrahepatic biliary ductal dilatation. No filling defect in the CBD. If there is hyperbilirubinemia then follow-up MRCP and/or ERCP may be valuable. 3. Gas within the urinary bladder, suspicious for UTI unless explained by recent catheterization. 4. Other chronic findings but no other acute or inflammatory process in the abdomen or pelvis. Electronically Signed   By: HGenevie AnnM.D.   On: 01/03/2020 01:10   PERIPHERAL VASCULAR CATHETERIZATION  Result Date: 03/15/2020 See Op Note  NM PET Image Initial (PI) Skull Base To Thigh  Result Date: 01/25/2020 CLINICAL DATA:  Initial treatment strategy for chest CT demonstrating right lower lobe pneumonia versus mass. EXAM: NUCLEAR MEDICINE PET SKULL BASE TO THIGH TECHNIQUE: 8.9 mCi F-18 FDG was injected intravenously. Full-ring PET imaging was performed from the skull base to thigh after the radiotracer. CT data was obtained and used for attenuation correction and anatomic localization. Fasting blood glucose: 89 mg/dl COMPARISON:  CTA chest 01/03/2020.  Abdominopelvic CT 01/03/2020. FINDINGS: Mediastinal blood pool activity: SUV max 2.0 Liver activity: SUV max NA NECK: No areas of abnormal hypermetabolism. Incidental CT findings: No cervical adenopathy. Bilateral carotid atherosclerosis. CHEST: Pleural-based right lower lobe partially cavitary lung mass persists. 3.3 cm and a S.U.V. max of  18.5, including on 93/4. Hypermetabolic adenopathy within the azygoesophageal recess. 1.4 cm and a S.U.V. max of 16.2 on 85/4. A low right paratracheal node measures 7 mm and a S.U.V. max of 2.7 on 81/4, equivocal. Incidental  CT findings: Similar trace right pleural fluid or thickening. Cardiomegaly. Aortic and coronary artery atherosclerosis. Interstitial lung disease, suboptimally evaluated. Concurrent centrilobular emphysema. ABDOMEN/PELVIS: No abdominopelvic parenchymal or nodal hypermetabolism. Incidental CT findings: Deferred to recent diagnostic CT. 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 Abdominal aortic atherosclerosis. No acute superimposed process. Pelvic degradation secondary to right hip arthroplasty. SKELETON: No suspicious marrow hypermetabolism. Superficial to the proximal right femur is subcutaneous soft tissue thickening and mild hypermetabolism, including at a S.U.V. max of 3.4 on 256/4. Likely postoperative. Incidental CT findings: Osteopenia. Upper thoracic vertebral body height loss was present on 01/03/2020. IMPRESSION: 1. Persistent right lower lobe lung mass, consistent with primary bronchogenic carcinoma. 2. Hypermetabolic node within the azygoesophageal recess, consistent with nodal metastasis. A right paratracheal node is equivocal. 3. No evidence of hypermetabolic extrathoracic metastatic disease. Electronically Signed   By: Abigail Miyamoto M.D.   On: 01/25/2020 20:20   CT T-SPINE NO CHARGE  Result Date: 01/31/2020 CLINICAL DATA:  Lung cancer, right chest pain EXAM: CT THORACIC SPINE WITHOUT CONTRAST TECHNIQUE: Multidetector CT images of the thoracic were obtained using the standard protocol without intravenous contrast. COMPARISON:  None. Findings are correlated with PET CT examination of 01/25/2020 and concurrently performed CT arteriogram of the chest FINDINGS: Alignment: Normal thoracic kyphosis.  No listhesis. Vertebrae: Mild anterior wedge compression fracture T1 and T3 are identified with approximately 40% and 20% loss of height, respectively. No acute fracture of the thoracic spine. There is no lytic or blastic bone lesion identified involving the vertebral bodies of the thoracic spine. There  is, however, subtle erosion of the a right eighth rib just lateral to the a costovertebral junction, best seen on axial image # 67/11 and 67/10 in keeping with chest wall invasion and direct involvement of the a thoracic cage by the primary mass within the posterior basal right lower lobe. Paraspinal and other soft tissues: Posterior basal right lower lobe pleural based central necrotic pulmonary mass again identified. Mild subpleural pulmonary fibrosis again noted. Pathologic as ago esophageal recess noted. Multiple simple cortical cysts are seen within the visualized left kidney. Disc levels: Intervertebral disc height has been preserved though there are discogenic changes noted predominantly at T5-T11 in keeping with changes of mild to moderate degenerative disc disease. Review of the axial images demonstrates no significant canal stenosis. No significant neural foraminal narrowing. IMPRESSION: 1. No lytic or blastic bone lesion identified involving the vertebral bodies of the thoracic spine. 2. Mild anterior wedge compression fractures of T1 and T3 are identified with approximately 40% and 20% loss of height, respectively. These appear chronic in nature. 3. No significant canal stenosis or neural foraminal narrowing is seen within the thoracic spine. 4. Posterior basal right lower lobe pleural based central necrotic pulmonary mass again identified. There is subtle erosion of the right eighth rib just lateral to the a costovertebral junction in keeping with chest wall invasion and direct involvement of the a thoracic cage by the primary mass within the posterior basal right lower lobe. Electronically Signed   By: Fidela Salisbury MD   On: 01/31/2020 16:41   CT BIOPSY  Result Date: 02/21/2020 CLINICAL DATA:  Persistent posterior right lower lobe lung mass demonstrating increased metabolic activity by PET scan. The patient presents  for CT-guided biopsy of the lung mass. EXAM: CT GUIDED CORE BIOPSY OF RIGHT LOWER  LOBE LUNG MASS ANESTHESIA/SEDATION: 2.0 mg IV Versed; 100 mcg IV Fentanyl Total Moderate Sedation Time:  26 minutes. The patient's level of consciousness and physiologic status were continuously monitored during the procedure by Radiology nursing. PROCEDURE: The procedure risks, benefits, and alternatives were explained to the patient. Questions regarding the procedure were encouraged and answered. The patient understands and consents to the procedure. A time-out was performed prior to initiating the procedure. CT was performed through the chest in a prone position. The right posterior chest wall was prepped with chlorhexidine in a sterile fashion, and a sterile drape was applied covering the operative field. A sterile gown and sterile gloves were used for the procedure. Local anesthesia was provided with 1% Lidocaine. Under CT guidance, a 17 gauge trocar needle was advanced to the level of a posterior right lower lobe lung mass. After confirming needle tip position, 2 separate coaxial 18 gauge core biopsy samples were obtained and submitted in formalin. The BioSentry device was used in depositing a plug at the pleural entry site. Additional CT was performed after outer needle removal. COMPLICATIONS: None FINDINGS: Posterior subpleural right lower lobe lung mass again noted measuring approximately 3.0 x 3.9 cm in greatest transverse dimensions. Solid tissue was obtained. There was no evidence of regional hemorrhage or pneumothorax immediately following the procedure by CT. The patient will recover for at least 2 hours after the procedure. IMPRESSION: CT-guided core biopsy performed of a 3.9 cm right lower lobe subpleural mass. Electronically Signed   By: Aletta Edouard M.D.   On: 02/21/2020 12:46   ECHOCARDIOGRAM COMPLETE  Result Date: 02/15/2020    ECHOCARDIOGRAM REPORT   Patient Name:   [name] Date of Exam: 02/15/2020 Medical Rec #:  [id number]       Height:       65.0 in Accession #:    [id number]       Weight:       162.0 lb Date of Birth:  [birth year]        BSA:          1.809 m Patient Age:    58 years        BP:           138/84 mmHg Patient Gender: F               HR:           92 bpm. Exam Location:  [place] Procedure: 2D Echo, Cardiac Doppler and Color Doppler Indications:    R06.02 SOB  History:        Patient has prior history of Echocardiogram examinations, most                 recent 05/02/2018. COPD, Arrythmias:Tachycardia,                 Signs/Symptoms:Chest Pain and Syncope; Risk                 Factors:Hypertension, Dyslipidemia and Non-Smoker. Anemia                 Interstitial lung disease.  Sonographer:    Pilar Jarvis RDMS, RVT, RDCS Referring Phys: [id number] [name]  Sonographer Comments: This was a poor exam due to several factors. Supine imaging was best but patient was in pain on her back. The patient was intolerant to all transducer pressure and had trouble executing a  proper breath hold. Unknown acoustic interference possible from COPD or other lung condition IMPRESSIONS  1. Left ventricular ejection fraction, by estimation, is >55%. The left ventricle has normal function. Left ventricular endocardial border not optimally defined to evaluate regional wall motion. There is mild left ventricular hypertrophy. Left ventricular diastolic parameters are indeterminate.  2. Pulmonary artery pressure is at least upper normal to mildly elevated (PASP 30-35 mmHg plus central venous pressure). Right ventricular systolic function is normal. The right ventricular size is normal. Mildly increased right ventricular wall thickness.  3. The mitral valve is grossly normal. No evidence of mitral valve regurgitation.  4. The aortic valve has an indeterminant number of cusps. There is moderate calcification of the aortic valve. There is moderate thickening of the aortic valve. Aortic valve regurgitation is not visualized. Mild to moderate aortic valve sclerosis/calcification is present, without any  evidence of aortic stenosis.  5. Aortic dilatation noted. There is borderline dilatation of the ascending aorta, measuring 36 mm.  6. Mildly dilated pulmonary artery. FINDINGS  Left Ventricle: Left ventricular ejection fraction, by estimation, is >55%. The left ventricle has normal function. Left ventricular endocardial border not optimally defined to evaluate regional wall motion. The left ventricular internal cavity size was  normal in size. There is mild left ventricular hypertrophy. Left ventricular diastolic parameters are indeterminate. Right Ventricle: Pulmonary artery pressure is at least upper normal to mildly elevated (PASP 30-35 mmHg plus central venous pressure). The right ventricular size is normal. Mildly increased right ventricular wall thickness. Right ventricular systolic function is normal. Left Atrium: Left atrial size was normal in size. Right Atrium: Right atrial size was not well visualized. Pericardium: There is no evidence of pericardial effusion. Mitral Valve: The mitral valve is grossly normal. No evidence of mitral valve regurgitation. Tricuspid Valve: The tricuspid valve is not well visualized. Tricuspid valve regurgitation is trivial. Aortic Valve: The non-coronary leaflet appears fixed. The aortic valve has an indeterminant number of cusps. There is moderate calcification of the aortic valve. There is moderate thickening of the aortic valve. Aortic valve regurgitation is not visualized. Mild to moderate aortic valve sclerosis/calcification is present, without any evidence of aortic stenosis. Aortic valve mean gradient measures 8.0 mmHg. Aortic valve peak gradient measures 12.7 mmHg. Aortic valve area, by VTI measures 2.11 cm. Pulmonic Valve: The pulmonic valve was normal in structure. Pulmonic valve regurgitation is not visualized. No evidence of pulmonic stenosis. Aorta: Aortic dilatation noted. There is borderline dilatation of the ascending aorta, measuring 36 mm. Pulmonary Artery:  The pulmonary artery is mildly dilated. Venous: The inferior vena cava was not well visualized. IAS/Shunts: The interatrial septum was not well visualized.  LEFT VENTRICLE PLAX 2D LVIDd:         4.20 cm  Diastology LVIDs:         3.10 cm  LV e' medial:    5.66 cm/s LV PW:         1.10 cm  LV E/e' medial:  11.1 LV IVS:        1.10 cm  LV e' lateral:   11.60 cm/s LVOT diam:     2.10 cm  LV E/e' lateral: 5.4 LV SV:         65 LV SV Index:   36 LVOT Area:     3.46 cm  RIGHT VENTRICLE             IVC RV S prime:     11.60 cm/s  IVC diam: 2.10 cm LEFT  ATRIUM           Index LA diam:      3.50 cm 1.94 cm/m LA Vol (A2C): 54.3 ml 30.02 ml/m  AORTIC VALVE AV Area (Vmax):    1.79 cm AV Area (Vmean):   1.74 cm AV Area (VTI):     2.11 cm AV Vmax:           178.50 cm/s AV Vmean:          133.500 cm/s AV VTI:            0.308 m AV Peak Grad:      12.7 mmHg AV Mean Grad:      8.0 mmHg LVOT Vmax:         92.00 cm/s LVOT Vmean:        67.100 cm/s LVOT VTI:          0.188 m LVOT/AV VTI ratio: 0.61  AORTA Ao Root diam: 2.80 cm Ao Asc diam:  3.60 cm MITRAL VALVE                TRICUSPID VALVE MV Area (PHT): 2.53 cm     TR Peak grad:   31.4 mmHg MV Decel Time: 300 msec     TR Vmax:        280.00 cm/s MV E velocity: 62.60 cm/s MV A velocity: 102.00 cm/s  SHUNTS MV E/A ratio:  0.61         Systemic VTI:  0.19 m                             Systemic Diam: 2.10 cm Nelva Bush MD Electronically signed by Nelva Bush MD Signature Date/Time: 02/15/2020/5:09:07 PM    Final    DG Hip Unilat W OR W/O Pelvis 2-3 Views Right  Result Date: 01/17/2020 CLINICAL DATA:  Right hip pain EXAM: DG HIP (WITH OR WITHOUT PELVIS) 2-3V RIGHT COMPARISON:  09/07/2019 FINDINGS: Changes of right hip replacement. No hardware complicating feature. No fracture, subluxation or dislocation IMPRESSION: No acute bony abnormality. Electronically Signed   By: Rolm Baptise M.D.   On: 01/17/2020 22:55    ASSESSMENT & PLAN:  1. Squamous cell carcinoma of  lung, right ([place])   2. Anxiety   3. Non-intractable vomiting with nausea, unspecified vomiting type   4. Hypokalemia   Cancer Staging Squamous cell carcinoma of lung, right ([facility]) Staging form: Lung, AJCC 8th Edition - Clinical: cT1, cN2, cM0 - Signed by Earlie Server, MD on 02/25/2020   # Squamous cell carcinoma of lung, Stage III Recommend Radiation +/- chemotherapy.  Labs reviewed and discussed with patient. I will hold chemotherapy treatments. Patient is currently on radiation only and has already experienced moderate difficulties including not intractable nausea vomiting and dehydration episodes.  Patient has received supportive care. Performance status is 2-3. Patient that I recommend patient to proceed with radiation only currently due to her poor performance status. May consider immunotherapy maintenance afterwards.  #Pre-existing nausea and vomiting, this is likely due to her underlying anxiety/mood disorders.  Continue antiemetics and anxiety medication.  Proceed with 1 L of IV fluid normal saline today.  #Hypokalemia, recommend patient to continue potassium chloride 10 mill equivalent twice daily. Patient will receive IV potassium chloride 20 mEq x 1 today.  # History of PE and DVT Continue Eliquis 2.5 mg twice daily for maintenance anticoagulation.  #Anemia, hemoglobin has decreased, likely secondary to radiation.  Monitor. #  Poor appetite/weight loss, refer to nutritionist.  Anxiety, anxiety seems to be worse since she knows the abnormal findings.  Continue Xanax and Cymbalta and Remeron..recommend her to continue follow up with primary care provider.   #UTI, finish antibiotic course.    All questions were answered. The patient knows to call the clinic with any problems questions or concerns.  Return of visit: 1 week  Earlie Server, MD, PhD Hematology Oncology [place] at [facility] 03/29/2020

## 2020-03-30 ENCOUNTER — Ambulatory Visit
Admission: RE | Admit: 2020-03-30 | Discharge: 2020-03-30 | Disposition: A | Discharge status: Home / Self Care | Payer: Medicare Other | Source: Ambulatory Visit | Attending: Radiation Oncology | Admitting: Radiation Oncology

## 2020-03-30 ENCOUNTER — Encounter (INDEPENDENT_AMBULATORY_CARE_PROVIDER_SITE_OTHER): Payer: Self-pay | Admitting: Nurse Practitioner

## 2020-03-30 ENCOUNTER — Inpatient Hospital Stay: Payer: Medicare Other

## 2020-03-30 ENCOUNTER — Ambulatory Visit (INDEPENDENT_AMBULATORY_CARE_PROVIDER_SITE_OTHER): Payer: Medicare Other | Admitting: Nurse Practitioner

## 2020-03-30 VITALS — BP 155/97 | HR 101 | Resp 16 | Wt 160.0 lb

## 2020-03-30 DIAGNOSIS — C3491 Malignant neoplasm of unspecified part of right bronchus or lung: Secondary | ICD-10-CM | POA: Diagnosis not present

## 2020-03-30 DIAGNOSIS — C771 Secondary and unspecified malignant neoplasm of intrathoracic lymph nodes: Secondary | ICD-10-CM | POA: Diagnosis not present

## 2020-03-30 DIAGNOSIS — I10 Essential (primary) hypertension: Secondary | ICD-10-CM | POA: Diagnosis not present

## 2020-03-30 DIAGNOSIS — C3431 Malignant neoplasm of lower lobe, right bronchus or lung: Secondary | ICD-10-CM | POA: Diagnosis not present

## 2020-03-30 DIAGNOSIS — Z510 Encounter for antineoplastic radiation therapy: Secondary | ICD-10-CM | POA: Diagnosis not present

## 2020-03-31 ENCOUNTER — Inpatient Hospital Stay: Payer: Medicare Other

## 2020-03-31 ENCOUNTER — Other Ambulatory Visit: Payer: Self-pay | Admitting: Family Medicine

## 2020-03-31 ENCOUNTER — Ambulatory Visit
Admission: RE | Admit: 2020-03-31 | Discharge: 2020-03-31 | Disposition: A | Discharge status: Home / Self Care | Payer: Medicare Other | Source: Ambulatory Visit | Attending: Radiation Oncology | Admitting: Radiation Oncology

## 2020-03-31 ENCOUNTER — Other Ambulatory Visit: Payer: Self-pay | Admitting: Nurse Practitioner

## 2020-03-31 DIAGNOSIS — C771 Secondary and unspecified malignant neoplasm of intrathoracic lymph nodes: Secondary | ICD-10-CM | POA: Diagnosis not present

## 2020-03-31 DIAGNOSIS — C3431 Malignant neoplasm of lower lobe, right bronchus or lung: Secondary | ICD-10-CM | POA: Diagnosis not present

## 2020-03-31 DIAGNOSIS — Z510 Encounter for antineoplastic radiation therapy: Secondary | ICD-10-CM | POA: Diagnosis not present

## 2020-03-31 NOTE — Telephone Encounter (Signed)
Pharmacy requests refill on: Amlodipine 5 mg  LAST REFILL: 08/28/2019 LAST OV: 03/09/2020 NEXT OV: Not Scheduled PHARMACY: [place]

## 2020-04-01 ENCOUNTER — Telehealth: Payer: Self-pay | Admitting: Oncology

## 2020-04-01 ENCOUNTER — Other Ambulatory Visit: Payer: Self-pay | Admitting: Family Medicine

## 2020-04-01 DIAGNOSIS — R112 Nausea with vomiting, unspecified: Secondary | ICD-10-CM

## 2020-04-01 NOTE — Telephone Encounter (Signed)
Daughter called to report that patient's chronic nausea has been worse today. Not eat much. Tried compazine which did not help. Advise her to try Zofran or Phenergan. If still no relief of her symptoms, I advise her to go to ER for further evaluation.

## 2020-04-02 ENCOUNTER — Encounter (INDEPENDENT_AMBULATORY_CARE_PROVIDER_SITE_OTHER): Payer: Self-pay | Admitting: Nurse Practitioner

## 2020-04-02 ENCOUNTER — Other Ambulatory Visit: Payer: Self-pay | Admitting: Oncology

## 2020-04-02 NOTE — Progress Notes (Signed)
Subjective:    Patient ID: [name], female    DOB: 1940-07-31, 79 y.o.   MRN: [id number] Chief Complaint  Patient presents with  . Follow-up    check port access    On 03/15/2020 [name] underwent a Port-A-Cath insertion.  Subsequently after the patient's oncologist was concerned for redness around the area and possible infection.  The patient and daughter note however that this area had been significantly palpated prior to this.  However, the patient was supposed to have an office visit on 03/24/2020 but she ended up going to the emergency room.  The patient was concerned about a pulmonary embolism as she has a previous history of these.  However it was found that she had a UTI.  At that time she was given Levaquin in the emergency room as well as given Levaquin as an at home prescription.  Since that emergency room visit the patient has not had any pain swelling redness or drainage from the area.  She has successfully used her port for approximately 2 infusions.  Overall the patient is in good spirits.  She denies any fever, chills, nausea, vomiting or diarrhea.  She denies any chest pain or shortness of breath.   Review of Systems  Skin: Negative for wound.  Neurological: Positive for weakness.  All other systems reviewed and are negative.      Objective:   Physical Exam Vitals reviewed.  HENT:     Head: Normocephalic.  Cardiovascular:     Rate and Rhythm: Normal rate.  Pulmonary:     Effort: Pulmonary effort is normal.  Skin:    General: Skin is warm and dry.     Findings: No erythema.  Neurological:     Mental Status: She is alert and oriented to person, place, and time. Mental status is at baseline.     Motor: Weakness present.  Psychiatric:        Mood and Affect: Mood normal.        Behavior: Behavior normal.        Thought Content: Thought content normal.        Judgment: Judgment normal.     BP (!) 155/97 (BP Location: Right Arm)   Pulse (!) 101    Resp 16   Wt 160 lb (72.6 kg)   BMI 26.63 kg/m   Past Medical History:  Diagnosis Date  . Acute postoperative pain 02/03/2018  . Anemia   . B12 deficiency   . Bacteremia 10/07/2014  . Bladder incontinence   . Blind left eye   . Cancer ([place])    skin cancer   . Cataract   . Cervical spine fracture ([place])   . Chest pain 07/29/2013  . Compression fracture    C7,  upper T spine -compression fx  . COPD (chronic obstructive pulmonary disease) ([place])   . DDD (degenerative disc disease), lumbar   . Depression    major  . Diffuse myofascial pain syndrome 03/24/2015  . Dyspnea    at times- when activty  . Fever 10/05/2014  . GERD (gastroesophageal reflux disease)   . Hiatal hernia   . Hyperlipidemia   . Hypertension   . 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 Lung cancer ([place])   . Macular degeneration   . On home oxygen therapy    "3L; all the time" (04/30/2018)  . Osteoarthritis   . Osteoporosis   . Pneumonia    years ago  . Pulmonary embolism ([place]) 04/30/2018  .  Reactive airway disease   . Rupture of bowel ([place])   . Sepsis ([place]) 10/08/2014  . Stroke [facility])    "mini stroke"- balance and memory issue  . Stroke ([place])   . Wrist pain, acute 09/10/2012    Social History   Socioeconomic History  . Marital status: Widowed    Spouse name: Not on file  . Number of children: 3  . Years of education: middle sch  . Highest education level: Not on file  Occupational History  . Occupation: retired    Comment: n/a  Tobacco Use  . Smoking status: Former Smoker    Packs/day: 1.00    Years: 30.00    Pack years: 30.00    Types: Cigarettes    Quit date: 1990    Years since quitting: 31.8  . Smokeless tobacco: Never Used  Vaping Use  . Vaping Use: Former  Substance and Sexual Activity  . Alcohol use: Not Currently  . Drug use: Not Currently  . Sexual activity: Not Currently  Other Topics Concern  . Not on file  Social History Narrative   ** Merged History Encounter  **       ** Merged History Encounter **       Patient lives at home with daughter. Caffeine Use: 4 cups daily   Social Determinants of Health   Financial Resource Strain: Low Risk   . Difficulty of Paying Living Expenses: Not hard at all  Food Insecurity: No Food Insecurity  . Worried About Charity fundraiser in the Last Year: Never true  . Ran Out of Food in the Last Year: Never true  Transportation Needs: No Transportation Needs  . Lack of Transportation (Medical): No  . Lack of Transportation (Non-Medical): No  Physical Activity: Inactive  . Days of Exercise per Week: 0 days  . Minutes of Exercise per Session: 0 min  Stress: Stress Concern Present  . Feeling of Stress : Very much  Social Connections:   . Frequency of Communication with Friends and Family: Not on file  . Frequency of Social Gatherings with Friends and Family: Not on file  . Attends Religious Services: Not on file  . Active Member of Clubs or Organizations: Not on file  . Attends Archivist Meetings: Not on file  . Marital Status: Not on file  Intimate Partner Violence: Not At Risk  . Fear of Current or Ex-Partner: No  . Emotionally Abused: No  . Physically Abused: No  . Sexually Abused: No    Past Surgical History:  Procedure Laterality Date  . ABDOMINAL HYSTERECTOMY    . ABDOMINAL SURGERY    . APPENDECTOMY    . BLADDER SURGERY    . CATARACT EXTRACTION W/ INTRAOCULAR LENS  IMPLANT, BILATERAL Bilateral   . CHOLECYSTECTOMY    . COLON SURGERY    . COLOSTOMY REVERSAL    . ESOPHAGOGASTRODUODENOSCOPY N/A 10/30/2018   Procedure: ESOPHAGOGASTRODUODENOSCOPY (EGD);  Surgeon: Lin Landsman, MD;  Location: [facility] ENDOSCOPY;  Service: Gastroenterology;  Laterality: N/A;  . INTRAMEDULLARY (IM) NAIL INTERTROCHANTERIC Right 09/13/2016   Procedure: INTRAMEDULLARY (IM) NAIL INTERTROCHANTRIC RIGHT;  Surgeon: Leandrew Koyanagi, MD;  Location: [place];  Service: Orthopedics;  Laterality: Right;  . JOINT  REPLACEMENT Bilateral   . JOINT REPLACEMENT    . KNEE SURGERY    . OSTOMY    . PORTA CATH INSERTION N/A 03/15/2020   Procedure: PORTA CATH INSERTION;  Surgeon: Katha Cabal, MD;  Location: [place] CV LAB;  Service: Cardiovascular;  Laterality: N/A;  . RECTOCELE REPAIR    . TOE SURGERY Right    3rd  . TOTAL HIP ARTHROPLASTY Right 09/30/2017   Procedure: RIGHT HIP IM NAIL REMOVAL WITH CONVERSION TO TOTAL HIP ARTHOPLASTY, anterior approach;  Surgeon: Renette Butters, MD;  Location: [place];  Service: Orthopedics;  Laterality: Right;    Family History  Problem Relation Age of Onset  . Heart failure Mother   . Heart attack Father   . Lung cancer Sister   . Deep vein thrombosis Sister   . Pulmonary embolism Son   . Deep vein thrombosis Son   . Cervical cancer Daughter     Allergies  Allergen Reactions  . Ampicillin Anaphylaxis, Nausea Only and Swelling    SEVERE HEADACHE MUSCLE CRAMPS ANGIOEDEMA THRUSH PATIENT HAS HAD A PCN REACTION WITH IMMEDIATE RASH, FACIAL/TONGUE/THROAT SWELLING, SOB, OR LIGHTHEADEDNESS WITH HYPOTENSION:  #  #  YES  #  #  Has patient had a PCN reaction causing severe rash involving mucus membranes or skin necrosis: No Has patient had a PCN reaction that required hospitalization:No Has patient had a PCN reaction occurring within the last 10 years: No.  . Ampicillin-Sulbactam Sodium Anaphylaxis and Other (See Comments)    SEVERE HEADACHE MUSCLE CRAMPS ANGIOEDEMA THRUSH PATIENT HAS HAD A PCN REACTION WITH IMMEDIATE RASH, FACIAL/TONGUE/THROAT SWELLING, SOB, OR LIGHTHEADEDNESS WITH HYPOTENSION: # # YES # # Has patient had a PCN reaction causing severe rash involving mucus membranes or skin necrosis: No Has patient had a PCN reaction that required hospitalization:No Has patient had a PCN reaction occurring within the last 10 years: No  SEVERE HEADACHE MUSCLE CRAMPS ANGIOEDEMA THRUSH PATIENT HAS HAD A PCN REACTION WITH IMMEDIATE RASH,  FACIAL/TONGUE/THROAT SWELLING, SOB, OR LIGHTHEADEDNESS WITH HYPOTENSION: # # YES # # Has patient had a PCN reaction causing severe rash involving mucus membranes or skin necrosis: No Has patient had a PCN reaction that required hospitalization:No Has patient had a PCN reaction occurring within the last 10 years: No Muscles draw up tight - severe headache angioedema  . Ambien [Zolpidem Tartrate] Nausea And Vomiting and Other (See Comments)    HALLUCINATIONS SWEATING  . Cyclobenzaprine Other (See Comments)    EXTRAPYRAMIDAL MOVEMENT INVOLUNTARY MUSCLE JERKING EXTRAPYRAMIDAL MOVEMENT INVOLUNTARY MUSCLE JERKING Gets the jerks  . Tape Itching, Rash and Other (See Comments)    Paper tape only. Adhesive tape=itching/burning/rash  . Toradol [Ketorolac Tromethamine] Nausea Only and Other (See Comments)    HEADACHE  BACKACHE  . Sulfamethoxazole-Trimethoprim Nausea Only  . Cephalexin Rash and Other (See Comments)    HEADACHES  . Tramadol Rash and Other (See Comments)    HEADACHE     CBC Latest Ref Rng & Units 03/29/2020 03/24/2020 03/23/2020  WBC 4.0 - 10.5 K/uL 6.3 7.8 7.5  Hemoglobin 12.0 - 15.0 g/dL 9.9(L) 10.3(L) 10.5(L)  Hematocrit 36 - 46 % 31.7(L) 33.9(L) 33.6(L)  Platelets 150 - 400 K/uL 333 360 339      CMP     Component Value Date/Time   NA 137 03/29/2020 0843   NA 145 (H) 12/04/2016 1525   K 3.0 (L) 03/29/2020 0843   CL 102 03/29/2020 0843   CO2 26 03/29/2020 0843   GLUCOSE 111 (H) 03/29/2020 0843   BUN 7 (L) 03/29/2020 0843   BUN 7 (L) 12/04/2016 1525   CREATININE 1.01 (H) 03/29/2020 0843   CALCIUM 9.0 03/29/2020 0843   PROT 7.8 03/29/2020 0843   PROT 7.7 12/04/2016 1525   ALBUMIN  2.6 (L) 03/29/2020 0843   ALBUMIN 4.0 12/04/2016 1525   AST 20 03/29/2020 0843   ALT 9 03/29/2020 0843   ALKPHOS 83 03/29/2020 0843   BILITOT 0.2 (L) 03/29/2020 0843   BILITOT 0.2 12/04/2016 1525   GFRNONAA 57 (L) 03/29/2020 0843   GFRAA >60 02/03/2020 2050       Assessment &  Plan:   1. Squamous cell carcinoma of lung, right (HCC) Today the patient's Port-A-Cath area does not show any signs symptoms of infection or decreasing skin integrity.  If the patient did have infection it is likely that the Levaquin treated at that time.  Patient will continue to follow with oncology for treatments.  Patient advised to contact her office if issues do arise with the current port.  2. Primary hypertension Continue antihypertensive medications as already ordered, these medications have been reviewed and there are no changes at this time.    Current Outpatient Medications on File Prior to Visit  Medication Sig Dispense Refill  . acetaminophen (TYLENOL) 500 MG tablet Take 2 tablets (1,000 mg total) by mouth in the morning, at noon, and at bedtime. (Patient taking differently: Take 1,000 mg by mouth in the morning and at bedtime. )    . albuterol (ACCUNEB) 1.25 MG/3ML nebulizer solution Take 3 mLs (1.25 mg total) by nebulization every 6 (six) hours as needed for wheezing. 75 mL 1  . albuterol (VENTOLIN HFA) 108 (90 Base) MCG/ACT inhaler Inhale 2 puffs into the lungs every 6 (six) hours as needed for wheezing or shortness of breath. 1 Inhaler 2  . alendronate (FOSAMAX) 70 MG tablet TAKE 1 TABLET BY MOUTH ONCE A WEEK (Patient taking differently: Take 70 mg by mouth every Sunday. ) 12 tablet 1  . ALPRAZolam (XANAX) 1 MG tablet TAKE 0.5-1 TABLETS (0.5-1 MG TOTAL) BY MOUTH 2 (TWO) TIMES DAILY AS NEEDED FOR ANXIETY. 60 tablet 0  . Cyanocobalamin (VITAMIN B-12) 2500 MCG SUBL Take 2,500 mcg by mouth daily.     Marland Kitchen docusate sodium (COLACE) 100 MG capsule Take 100 mg by mouth daily.     . DULoxetine (CYMBALTA) 60 MG capsule Take 1 capsule (60 mg total) by mouth daily. 90 capsule 1  . ELIQUIS 2.5 MG TABS tablet TAKE 1 TABLET BY MOUTH TWICE A DAY (Patient taking differently: Take 2.5 mg by mouth in the morning and at bedtime. ) 60 tablet 3  . esomeprazole (NEXIUM) 40 MG capsule TAKE 1 CAPSULE BY  MOUTH EVERY DAY 90 capsule 1  . fentaNYL (DURAGESIC) 25 MCG/HR Place 1 patch onto the skin every 3 (three) days.    . Ferrous Gluconate (IRON 27 PO) Take 27 mg by mouth 2 (two) times a week.    Marland Kitchen FLOVENT HFA 220 MCG/ACT inhaler TAKE 1 PUFF BY MOUTH TWICE A DAY (Patient taking differently: Inhale 1 puff into the lungs 2 (two) times daily as needed. ) 24 Inhaler 5  . fluticasone (FLONASE) 50 MCG/ACT nasal spray SPRAY 2 SPRAYS INTO EACH NOSTRIL EVERY DAY (Patient taking differently: Place 2 sprays into both nostrils daily as needed. ) 48 mL 1  . levofloxacin (LEVAQUIN) 500 MG tablet Take 1 tablet (500 mg total) by mouth daily for 10 days. 7 tablet 0  . lidocaine-prilocaine (EMLA) cream Apply 1 application topically as needed. Apply to port and cover with saran wrap 1-2 hours prior to port access 30 g 1  . LINZESS 290 MCG CAPS capsule TAKE 1 CAPSULE BY MOUTH EVERY DAY 90 capsule 1  .  lovastatin (MEVACOR) 10 MG tablet TAKE 1 TABLET BY MOUTH EVERY DAY (Patient taking differently: Take 10 mg by mouth daily. ) 90 tablet 0  . mirtazapine (REMERON) 7.5 MG tablet TAKE 1 TABLET (7.5 MG TOTAL) BY MOUTH AT BEDTIME. 90 tablet 1  . ondansetron (ZOFRAN ODT) 4 MG disintegrating tablet Take 1 tablet (4 mg total) by mouth every 8 (eight) hours as needed for nausea or vomiting. 30 tablet 0  . orphenadrine (NORFLEX) 100 MG tablet Take 100 mg by mouth 2 (two) times daily as needed for muscle spasms. 1/4 every 2 days    . Oxycodone HCl 10 MG TABS Take 10 mg by mouth every 8 (eight) hours as needed (pain.).     Marland Kitchen polyethylene glycol (MIRALAX / GLYCOLAX) packet Take 17 g by mouth daily.     . pregabalin (LYRICA) 100 MG capsule Take 1 capsule (100 mg total) by mouth 2 (two) times daily. 180 capsule 0  . prochlorperazine (COMPAZINE) 10 MG tablet Take 1 tablet (10 mg total) by mouth every 6 (six) hours as needed (Nausea or vomiting). 30 tablet 1  . promethazine (PHENERGAN) 12.5 MG tablet Take 1 tablet (12.5 mg total) by mouth  every 8 (eight) hours as needed for nausea or vomiting. 30 tablet 1  . zafirlukast (ACCOLATE) 20 MG tablet Take 1 tablet (20 mg total) by mouth 2 (two) times daily before a meal. 60 tablet 0  . Multiple Minerals-Vitamins (CAL MAG ZINC +D3 PO) Take 1 tablet by mouth daily. (Patient not taking: Reported on 03/29/2020)    . NARCAN 4 MG/0.1ML LIQD nasal spray kit Place 1 spray into the nose daily as needed (accidental overdose).  (Patient not taking: Reported on 03/29/2020)     No current facility-administered medications on file prior to visit.    There are no Patient Instructions on file for this visit. No follow-ups on file.   Kris Hartmann, NP

## 2020-04-03 ENCOUNTER — Ambulatory Visit: Payer: Medicare Other

## 2020-04-03 ENCOUNTER — Encounter: Payer: Self-pay | Admitting: Family Medicine

## 2020-04-03 ENCOUNTER — Inpatient Hospital Stay: Payer: Medicare Other

## 2020-04-03 NOTE — Telephone Encounter (Signed)
Looks like she was given script for Xanax on 02/27/2020 and Phenergan on 02/08/2020.  Last seen in office 8/23/021 and no follow up made at this time.

## 2020-04-04 ENCOUNTER — Ambulatory Visit
Admission: RE | Admit: 2020-04-04 | Discharge: 2020-04-04 | Disposition: A | Discharge status: Home / Self Care | Payer: Medicare Other | Source: Ambulatory Visit | Attending: Radiation Oncology | Admitting: Radiation Oncology

## 2020-04-04 ENCOUNTER — Inpatient Hospital Stay: Payer: Medicare Other

## 2020-04-04 DIAGNOSIS — Z87891 Personal history of nicotine dependence: Secondary | ICD-10-CM | POA: Diagnosis not present

## 2020-04-04 DIAGNOSIS — C3431 Malignant neoplasm of lower lobe, right bronchus or lung: Secondary | ICD-10-CM | POA: Diagnosis not present

## 2020-04-04 DIAGNOSIS — C771 Secondary and unspecified malignant neoplasm of intrathoracic lymph nodes: Secondary | ICD-10-CM | POA: Diagnosis not present

## 2020-04-04 DIAGNOSIS — Z510 Encounter for antineoplastic radiation therapy: Secondary | ICD-10-CM | POA: Diagnosis not present

## 2020-04-05 ENCOUNTER — Inpatient Hospital Stay: Payer: Medicare Other

## 2020-04-05 ENCOUNTER — Telehealth: Payer: Self-pay

## 2020-04-05 ENCOUNTER — Ambulatory Visit: Payer: Medicare Other

## 2020-04-05 NOTE — Telephone Encounter (Signed)
[name] access nurse called and said pt has worsening cough with prod cough with white phlegm that is thick (pt not drinking a lot of fluids). Radiologist advised [name] that the radiation may be breaking things up and that might have worsened the cough in the last wk. Pt has no fever, no SOB and [name] is concerned about the rattling in pts chest. [facility]. I spoke with Nicole Chroman FNP and she said to send note to her and should would correspond with [facility]. [name] voiced understanding and will wait to hear from [place]. UC & ED precautions given and [name] voiced understanding.

## 2020-04-05 NOTE — Telephone Encounter (Signed)
Per [place] from Elnoria Howard: Pt daughter just called to say that her mother will not be coming today. She is scheduled for the [facility].  Pt has also been exposed to Covid by her some who tested positive yesterday. The daughter stated she is trying to get them tested and how should they proceed with appts. Please advise. Thank you  Casper Harrison, RN: I'm going call her and tell her to not come in until negative test results are back per Dr. Celesta Gentile are getting tested Thursday and will call back with results, so we cancelled radiation for today and thursday.   Appts on 11/11 for lab/ IVF cancelled. Will wait for pt to call back with results before rescheduling.

## 2020-04-05 NOTE — Telephone Encounter (Signed)
[name] - Client TELEPHONE ADVICE RECORD AccessNurse Patient Name: [name] Gender: Female DOB: 1941/03/17 Age: 79 Y 63 M 1 D Return Phone Number: [phone] (Primary), [phone] (Secondary) Address[name] [address] [place] [name] - Client Client Site [place] - [name] Physician [name]- NP Contact Type Call Who Is Calling Patient / Member / Family / Caregiver Call Type Triage / Clinical Caller Name [name] Relationship To Patient Daughter Return Phone Number [phone] (Primary) Chief Complaint Cough Reason for Call Symptomatic / Request for Health Information Initial Comment Caller states the patient is coughing really bad and worried about pneumonia. No appts in the office. Additional Comment She is taking radiation for one lung. She is coughing up some. Translation No Nurse Assessment Nurse: Harlow Mares, RN, Suanne Marker Date/Time [name] Time): 04/05/2020 1:54:34 PM Confirm and document reason for call. If symptomatic, describe symptoms. ---Caller states the patient is coughing really bad and worried about pneumonia. No appts in the office. 2 weeks of radiation of right lung. Does the patient have any new or worsening symptoms? ---Yes Will a triage be completed? ---Yes Related visit to physician within the last 2 weeks? ---Yes Does the PT have any chronic conditions? (i.e. diabetes, asthma, this includes High risk factors for pregnancy, etc.) ---Yes List chronic conditions. ---cancer of the lower right lobe of the lung; COPD; HTN; renal disease stage 3 Is this a behavioral health or substance abuse call? ---No Guidelines Guideline Title Affirmed Question Affirmed Notes Nurse Date/Time (Eastern Time) Cough - Acute Productive [1] Known COPD or other severe lung disease (i.e., bronchiectasis, cystic fibrosis, lung surgery) AND [2] worsening  symptoms (i.e., increased sputum purulence or amount, Harlow Mares, RN, Suanne Marker 04/05/2020 1:57:06 PM PLEASE NOTE: All timestamps contained within this report are represented as [place] Standard Time. CONFIDENTIALTY NOTICE: This fax transmission is intended only for the addressee. It contains information that is legally privileged, confidential or otherwise protected from use or disclosure. If you are not the intended recipient, you are strictly prohibited from reviewing, disclosing, copying using or disseminating any of this information or taking any action in reliance on or regarding this information. If you have received this fax in error, please notify us immediately by telephone so that we can arrange for its return to [place]. Phone: [phone], Toll-Free: [phone], Fax: [phone] Page: 2 of 2 Call Id: [id number] Guidelines Guideline Title Affirmed Question Affirmed Notes Nurse Date/Time [name] Time) increased breathing difficulty Disp. Time [name] Time) Disposition Final User 04/05/2020 2:02:16 PM See PCP within 24 Hours Yes Harlow Mares, RN, Suanne Marker Caller Disagree/Comply Comply Caller Understands Yes PreDisposition Call Doctor Care Advice Given Per Guideline SEE PCP WITHIN 24 HOURS: * IF OFFICE WILL BE OPEN: You need to be examined within the next 24 hours. Call your doctor (or NP/PA) when the office opens and make an appointment. CALL BACK IF: * You become worse CARE ADVICE given per Cough - Acute Productive (Adult) guideline. DRINK PLENTY OF LIQUIDS: * Drink plenty of liquids. * Staying well-hydrated will help loosen phlegm. * The liquids will also help soothe a dry or irritated throat. Referrals Warm transfer to [place] PCP OFFICE

## 2020-04-05 NOTE — Telephone Encounter (Signed)
MyChart message sent to patient's daughter.

## 2020-04-06 ENCOUNTER — Inpatient Hospital Stay: Payer: Medicare Other

## 2020-04-06 ENCOUNTER — Ambulatory Visit: Payer: Medicare Other

## 2020-04-06 ENCOUNTER — Inpatient Hospital Stay: Payer: Medicare Other | Admitting: Oncology

## 2020-04-07 ENCOUNTER — Inpatient Hospital Stay: Payer: Medicare Other

## 2020-04-07 ENCOUNTER — Ambulatory Visit: Payer: Medicare Other

## 2020-04-07 ENCOUNTER — Telehealth: Payer: Self-pay | Admitting: *Deleted

## 2020-04-07 DIAGNOSIS — G8929 Other chronic pain: Secondary | ICD-10-CM | POA: Diagnosis not present

## 2020-04-07 DIAGNOSIS — R202 Paresthesia of skin: Secondary | ICD-10-CM | POA: Diagnosis not present

## 2020-04-07 DIAGNOSIS — M25551 Pain in right hip: Secondary | ICD-10-CM | POA: Diagnosis not present

## 2020-04-07 DIAGNOSIS — M25561 Pain in right knee: Secondary | ICD-10-CM | POA: Diagnosis not present

## 2020-04-07 NOTE — Telephone Encounter (Signed)
Per daughter, patient had a positive rapid covid test.   They are going to have a PCR test performed tomorrow.   Dtr is going to call us back with the results of the PCR test.

## 2020-04-07 NOTE — Telephone Encounter (Signed)
Per [place] update from Casper Harrison: I just spoke with dtr. She said her mom's rapid test was positive, but hers was negative. So they are going tomorrow to get PCR tests. I told her to keep [place] posted and that she wouldn't be able to come in for at least a couple weeks if she is positive.

## 2020-04-10 ENCOUNTER — Ambulatory Visit: Payer: Medicare Other

## 2020-04-10 ENCOUNTER — Other Ambulatory Visit: Payer: Self-pay | Admitting: Nurse Practitioner

## 2020-04-10 ENCOUNTER — Encounter: Payer: Self-pay | Admitting: Family Medicine

## 2020-04-10 ENCOUNTER — Inpatient Hospital Stay: Payer: Medicare Other

## 2020-04-10 ENCOUNTER — Telehealth: Payer: Self-pay | Admitting: *Deleted

## 2020-04-10 DIAGNOSIS — U071 COVID-19: Secondary | ICD-10-CM

## 2020-04-10 NOTE — Telephone Encounter (Signed)
Done.. Appt has been scheduled as requested lab/MD/+/- IVF in 3 wks. Pts [name] was made aware

## 2020-04-10 NOTE — Telephone Encounter (Signed)
Call returned to daughter and had to leave message on her voice mail to take her to the ER for evaluation per VERBAL ORDER Dr Tasia Catchings given the fact that she is COVID positive

## 2020-04-10 NOTE — Telephone Encounter (Signed)
Per [place] from Casper Harrison, RN: Dtr called and said [facility] both [facility]   [name], please schedule pt for lab/MD/ IVF in 3 weeks and call pt/dtr with appt details. Thanks.

## 2020-04-10 NOTE — Telephone Encounter (Signed)
Daughter called reporting that patient HR is 157-161 just lying In bed. Asking what to do. Please return her all [phone]

## 2020-04-10 NOTE — Progress Notes (Signed)
I connected by phone with [name] on 04/10/2020 at 9:49 AM to discuss the potential use of a treatment for mild to moderate COVID-19 viral infection in non-hospitalized patients.  This patient is a 80 y.o. female that meets the FDA criteria for Emergency Use Authorization of bamlanivimab/etesevimab, casirivimab\imdevimab, or sotrovimab  Has a (+) direct SARS-CoV-2 viral test result  Has mild or moderate COVID-19   Is ? 79 years of age and weighs ? 40 kg  Is NOT hospitalized due to COVID-19  Is NOT requiring oxygen therapy or requiring an increase in baseline oxygen flow rate due to COVID-19  Is within 10 days of symptom onset  Has at least one of the high risk factor(s) for progression to severe COVID-19 and/or hospitalization as defined in EUA.  Specific high risk criteria : Older age (>/= 78 yo), BMI > 25, Chronic Kidney Disease (CKD), Immunosuppressive Disease or Treatment, Cardiovascular disease or hypertension and Chronic Lung Disease   I have spoken and communicated the following to the patient or parent/caregiver:  1. FDA has authorized the emergency use of bamlanivimab/etesevimab, casirivimab\imdevimab, or sotrovimab for the treatment of mild to moderate COVID-19 in adults and pediatric patients with positive results of direct SARS-CoV-2 viral testing who are 60 years of age and older weighing at least 40 kg, and who are at high risk for progressing to severe COVID-19 and/or hospitalization.  2. The significant known and potential risks and benefits of bamlanivimab/etesevimab, casirivimab\imdevimab, or sotrovimab, and the extent to which such potential risks and benefits are unknown.  3. Information on available alternative treatments and the risks and benefits of those alternatives, including clinical trials.  4. Patients treated with bamlanivimab/etesevimab, casirivimab\imdevimab, or sotrovimab should continue to self-isolate and use infection control measures (e.g.,  wear mask, isolate, social distance, avoid sharing personal items, clean and disinfect "high touch" surfaces, and frequent handwashing) according to CDC guidelines.   5. The patient or parent/caregiver has the option to accept or refuse bamlanivimab/etesevimab, casirivimab\imdevimab, or sotrovimab.  After reviewing this information with the patient, the patient has agreed to receive one of the available covid 19 monoclonal antibodies and will be provided an appropriate fact sheet prior to infusion.Beckey Rutter, [place], AGNP-C [phone] ([place])

## 2020-04-11 ENCOUNTER — Ambulatory Visit (HOSPITAL_COMMUNITY)
Admission: RE | Admit: 2020-04-11 | Discharge: 2020-04-11 | Disposition: A | Discharge status: Home / Self Care | Payer: Medicare Other | Source: Ambulatory Visit | Attending: Pulmonary Disease | Admitting: Pulmonary Disease

## 2020-04-11 ENCOUNTER — Inpatient Hospital Stay: Payer: Medicare Other

## 2020-04-11 ENCOUNTER — Encounter: Payer: Self-pay | Admitting: Nurse Practitioner

## 2020-04-11 ENCOUNTER — Emergency Department (HOSPITAL_COMMUNITY): Payer: Medicare Other

## 2020-04-11 ENCOUNTER — Other Ambulatory Visit: Payer: Self-pay

## 2020-04-11 ENCOUNTER — Encounter (HOSPITAL_COMMUNITY): Payer: Self-pay

## 2020-04-11 ENCOUNTER — Ambulatory Visit: Payer: Medicare Other

## 2020-04-11 ENCOUNTER — Emergency Department (HOSPITAL_COMMUNITY)
Admission: EM | Admit: 2020-04-11 | Discharge: 2020-04-11 | Disposition: A | Discharge status: Home / Self Care | Payer: Medicare Other | Attending: Emergency Medicine | Admitting: Emergency Medicine

## 2020-04-11 ENCOUNTER — Other Ambulatory Visit: Payer: Self-pay | Admitting: Nurse Practitioner

## 2020-04-11 DIAGNOSIS — Z96653 Presence of artificial knee joint, bilateral: Secondary | ICD-10-CM | POA: Diagnosis not present

## 2020-04-11 DIAGNOSIS — J441 Chronic obstructive pulmonary disease with (acute) exacerbation: Secondary | ICD-10-CM | POA: Insufficient documentation

## 2020-04-11 DIAGNOSIS — Z85118 Personal history of other malignant neoplasm of bronchus and lung: Secondary | ICD-10-CM | POA: Insufficient documentation

## 2020-04-11 DIAGNOSIS — R000 Tachycardia, unspecified: Secondary | ICD-10-CM | POA: Insufficient documentation

## 2020-04-11 DIAGNOSIS — U071 COVID-19: Secondary | ICD-10-CM | POA: Insufficient documentation

## 2020-04-11 DIAGNOSIS — Z8616 Personal history of COVID-19: Secondary | ICD-10-CM | POA: Diagnosis not present

## 2020-04-11 DIAGNOSIS — R0602 Shortness of breath: Secondary | ICD-10-CM | POA: Diagnosis not present

## 2020-04-11 DIAGNOSIS — Z7951 Long term (current) use of inhaled steroids: Secondary | ICD-10-CM | POA: Diagnosis not present

## 2020-04-11 DIAGNOSIS — N189 Chronic kidney disease, unspecified: Secondary | ICD-10-CM | POA: Insufficient documentation

## 2020-04-11 DIAGNOSIS — Z23 Encounter for immunization: Secondary | ICD-10-CM | POA: Diagnosis not present

## 2020-04-11 DIAGNOSIS — Z96641 Presence of right artificial hip joint: Secondary | ICD-10-CM | POA: Insufficient documentation

## 2020-04-11 DIAGNOSIS — Z7901 Long term (current) use of anticoagulants: Secondary | ICD-10-CM | POA: Diagnosis not present

## 2020-04-11 DIAGNOSIS — Z87891 Personal history of nicotine dependence: Secondary | ICD-10-CM | POA: Diagnosis not present

## 2020-04-11 DIAGNOSIS — R5383 Other fatigue: Secondary | ICD-10-CM | POA: Diagnosis not present

## 2020-04-11 DIAGNOSIS — Z79899 Other long term (current) drug therapy: Secondary | ICD-10-CM | POA: Insufficient documentation

## 2020-04-11 DIAGNOSIS — I129 Hypertensive chronic kidney disease with stage 1 through stage 4 chronic kidney disease, or unspecified chronic kidney disease: Secondary | ICD-10-CM | POA: Diagnosis not present

## 2020-04-11 LAB — BRAIN NATRIURETIC PEPTIDE: B Natriuretic Peptide: 43 pg/mL (ref 0.0–100.0)

## 2020-04-11 LAB — CBC WITH DIFFERENTIAL/PLATELET
Abs Immature Granulocytes: 0.02 10*3/uL (ref 0.00–0.07)
Basophils Absolute: 0 10*3/uL (ref 0.0–0.1)
Basophils Relative: 0 %
Eosinophils Absolute: 0.1 10*3/uL (ref 0.0–0.5)
Eosinophils Relative: 2 %
HCT: 38 % (ref 36.0–46.0)
Hemoglobin: 11.1 g/dL — ABNORMAL LOW (ref 12.0–15.0)
Immature Granulocytes: 0 %
Lymphocytes Relative: 14 %
Lymphs Abs: 0.9 10*3/uL (ref 0.7–4.0)
MCH: 25.8 pg — ABNORMAL LOW (ref 26.0–34.0)
MCHC: 29.2 g/dL — ABNORMAL LOW (ref 30.0–36.0)
MCV: 88.4 fL (ref 80.0–100.0)
Monocytes Absolute: 0.7 10*3/uL (ref 0.1–1.0)
Monocytes Relative: 11 %
Neutro Abs: 4.8 10*3/uL (ref 1.7–7.7)
Neutrophils Relative %: 73 %
Platelets: 304 10*3/uL (ref 150–400)
RBC: 4.3 MIL/uL (ref 3.87–5.11)
RDW: 18.6 % — ABNORMAL HIGH (ref 11.5–15.5)
WBC: 6.6 10*3/uL (ref 4.0–10.5)
nRBC: 0 % (ref 0.0–0.2)

## 2020-04-11 LAB — URINALYSIS, ROUTINE W REFLEX MICROSCOPIC
Bilirubin Urine: NEGATIVE
Glucose, UA: NEGATIVE mg/dL
Hgb urine dipstick: NEGATIVE
Ketones, ur: NEGATIVE mg/dL
Nitrite: NEGATIVE
Protein, ur: NEGATIVE mg/dL
Specific Gravity, Urine: 1.009 (ref 1.005–1.030)
pH: 7 (ref 5.0–8.0)

## 2020-04-11 LAB — COMPREHENSIVE METABOLIC PANEL
ALT: 12 U/L (ref 0–44)
AST: 34 U/L (ref 15–41)
Albumin: 2.8 g/dL — ABNORMAL LOW (ref 3.5–5.0)
Alkaline Phosphatase: 85 U/L (ref 38–126)
Anion gap: 9 (ref 5–15)
BUN: 8 mg/dL (ref 8–23)
CO2: 25 mmol/L (ref 22–32)
Calcium: 9 mg/dL (ref 8.9–10.3)
Chloride: 103 mmol/L (ref 98–111)
Creatinine, Ser: 0.9 mg/dL (ref 0.44–1.00)
GFR, Estimated: >60 mL/min (ref >60)
Glucose, Bld: 88 mg/dL (ref 70–99)
Potassium: 4.2 mmol/L (ref 3.5–5.1)
Sodium: 137 mmol/L (ref 135–145)
Total Bilirubin: 0.4 mg/dL (ref 0.3–1.2)
Total Protein: 8.1 g/dL (ref 6.5–8.1)

## 2020-04-11 LAB — D-DIMER, QUANTITATIVE: D-Dimer, Quant: 2.3 ug/mL-FEU — ABNORMAL HIGH (ref 0.00–0.50)

## 2020-04-11 MED ORDER — FAMOTIDINE IN NACL 20-0.9 MG/50ML-% IV SOLN: 20.0000 mg | Freq: Once | INTRAVENOUS | Status: DC | PRN

## 2020-04-11 MED ORDER — IOHEXOL 350 MG/ML SOLN
100.0000 mL | Freq: Once | INTRAVENOUS | Status: AC | PRN
  Administered 2020-04-11: 100 mL via INTRAVENOUS

## 2020-04-11 MED ORDER — DIPHENHYDRAMINE HCL 50 MG/ML IJ SOLN: 50.0000 mg | Freq: Once | INTRAMUSCULAR | Status: DC | PRN

## 2020-04-11 MED ORDER — HEPARIN SOD (PORK) LOCK FLUSH 100 UNIT/ML IV SOLN
500.0000 [IU] | Freq: Once | INTRAVENOUS | Status: AC
  Administered 2020-04-11: 500 [IU]
  Filled 2020-04-11: qty 5

## 2020-04-11 MED ORDER — ALPRAZOLAM 0.5 MG PO TABS
0.5000 mg | ORAL_TABLET | Freq: Two times a day (BID) | ORAL | Status: DC | PRN
  Administered 2020-04-11: 1 mg via ORAL
  Filled 2020-04-11: qty 2

## 2020-04-11 MED ORDER — OXYCODONE HCL 5 MG PO TABS: 10.0000 mg | ORAL_TABLET | Freq: Three times a day (TID) | ORAL | Status: DC | PRN

## 2020-04-11 MED ORDER — SODIUM CHLORIDE 0.9 % IV SOLN: INTRAVENOUS | Status: DC | PRN

## 2020-04-11 MED ORDER — SOTROVIMAB 500 MG/8ML IV SOLN
500.0000 mg | Freq: Once | INTRAVENOUS | Status: AC
  Administered 2020-04-11: 500 mg via INTRAVENOUS

## 2020-04-11 MED ORDER — ONDANSETRON 4 MG PO TBDP: 4.0000 mg | ORAL_TABLET | Freq: Three times a day (TID) | ORAL | Status: DC | PRN

## 2020-04-11 MED ORDER — ALBUTEROL SULFATE HFA 108 (90 BASE) MCG/ACT IN AERS: 2.0000 | INHALATION_SPRAY | Freq: Once | RESPIRATORY_TRACT | Status: DC | PRN

## 2020-04-11 MED ORDER — APIXABAN 2.5 MG PO TABS
2.5000 mg | ORAL_TABLET | Freq: Two times a day (BID) | ORAL | Status: DC
  Administered 2020-04-11: 2.5 mg via ORAL
  Filled 2020-04-11: qty 1

## 2020-04-11 MED ORDER — PREGABALIN 50 MG PO CAPS
100.0000 mg | ORAL_CAPSULE | Freq: Two times a day (BID) | ORAL | Status: DC
  Administered 2020-04-11: 100 mg via ORAL
  Filled 2020-04-11: qty 2

## 2020-04-11 MED ORDER — PROCHLORPERAZINE MALEATE 10 MG PO TABS: 10.0000 mg | ORAL_TABLET | Freq: Four times a day (QID) | ORAL | Status: DC | PRN

## 2020-04-11 MED ORDER — METHYLPREDNISOLONE SODIUM SUCC 125 MG IJ SOLR: 125.0000 mg | Freq: Once | INTRAMUSCULAR | Status: DC | PRN

## 2020-04-11 MED ORDER — SODIUM CHLORIDE 0.9 % IV BOLUS: 500.0000 mL | Freq: Once | INTRAVENOUS | Status: DC

## 2020-04-11 MED ORDER — EPINEPHRINE 0.3 MG/0.3ML IJ SOAJ: 0.3000 mg | Freq: Once | INTRAMUSCULAR | Status: DC | PRN

## 2020-04-11 NOTE — ED Notes (Signed)
[facility] called for transport, states there are approximately 8-9 people ahead of her.

## 2020-04-11 NOTE — ED Notes (Signed)
Spoke with pt daughter regarding pt status.

## 2020-04-11 NOTE — ED Notes (Signed)
Pt assisted to bedside commode, given warm blanket and water.

## 2020-04-11 NOTE — ED Notes (Signed)
Pt frequently attempting to get out of bed stating she wants to leave, updated pt on status of waiting for ride home. Spoke with Dr Vanita Panda and states he will order her home medications

## 2020-04-11 NOTE — ED Notes (Signed)
Pt back from CT

## 2020-04-11 NOTE — Progress Notes (Signed)
Daughter states HR 93-160 yesterday; oncologist directed pt to go to ED, but patient declined.  Daughter states pulse decreased to around 93, finally yesterday.  Discussed status of pt with Jacolyn Reedy, NP- given active cancer and hx of PE, decided to discharge pt to the ED here at [facility].  Patient and her daughter agree to care.  Diagnosis: COVID-19  Physician: Dr. Asencion Noble  Procedure: Allergies reviewed.  Sotrovimab administered via IV infusion after med fact sheet provided to patient and her caregiver, her daughter [name].  Discharge instructions provided to patient's daughter; all questions answered.  Complications: Patient's pulse increased to 120's and per NP; sent to ED.  Discharge: Discharged to [facility] ED.  Monna Fam 04/11/2020

## 2020-04-11 NOTE — Progress Notes (Signed)
RN from [facility] notified me of patient experiencing increased heart rate after receiving IV infusion of mAb sotrovimab for treatment of COVID-19.   RN reports on arrival patients heart rate was in the 50's, however, on recheck after the infusion heart rate was noted to be in the 120's. Patients daughter, who is accompanying the patient to the infusion center today, reports that an elevation in heart rate has occurred several times recently and the patient was instructed by her oncologist to be evaluated in the ED for this last time, but she declined evaluation at that time. 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 The patients daughter reported to the nurse that the patient has not been eating or drinking well the past 5 days and has been inactive.   The patient is reportedly wishing to have further evaluation at this time.   Heart rate reportedly regular with strong pulses. No evidence of chest pain, shortness of breath, dizziness, or mental state altered from baseline. BP stable at 135/91.   Discussed management with RN and recommend that the patient be seen in the ED considering the change in her status and ongoing issues.   Order for 500cc NaCl bolus to be given while the patient waits for placement in the ED given probably dehydration status. Will likely need evaluation with EKG and possible blood work and imaging to rule out advancement of cancer or possible PE.   Bed available in ED- RN notified that bolus was unable to be started prior to moving. Will allow ED physician to determine if this is requiring based on in person evaluation. Order is in place.   Worthy Keeler, [place], AGNP-c [phone]

## 2020-04-11 NOTE — ED Notes (Signed)
Port accessed without issue at this time.

## 2020-04-11 NOTE — ED Provider Notes (Signed)
[facility] DEPT Provider Note   CSN: [id number] Arrival date & time: 04/11/20  1513     History Chief Complaint  Patient presents with  . Tachycardia  . Fatigue    [name] is a 79 y.o. female.  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 The patient self also has a history of dementia, though she seems to answer questions related to her current situation appropriately.  There is limited, however, capacity to provide longer-term details of her HPI, review of systems.  Additional historical details are obtained on chart review including ongoing chemotherapy/radiation therapy thoughts; cessation of chemotherapy recently due to poor functional status.  Per nursing staff the patient was at her antibody infusion center visit today after being diagnosed with Covid earlier in the week.  The issues found be tachycardic, with cough. Patient denies pain, equivocates on whether her dyspnea is worse than usual, when asked what we can do to make her more comfortable she asks to go home.   Past Medical History:  Diagnosis Date  . Acute postoperative pain 02/03/2018  . Anemia   . B12 deficiency   . Bacteremia 10/07/2014  . Bladder incontinence   . Blind left eye   . Cancer ([place])    skin cancer   . Cataract   . Cervical spine fracture ([place])   . Chest pain 07/29/2013  . Compression fracture    C7,  upper T spine -compression fx  . COPD (chronic obstructive pulmonary disease) ([place])   . DDD (degenerative disc disease), lumbar   . Depression    major  . Diffuse myofascial pain syndrome 03/24/2015  . Dyspnea    at times- when activty  . Fever 10/05/2014  . GERD (gastroesophageal reflux disease)   . Hiatal hernia   . Hyperlipidemia   .  Hypertension   . 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 Lung cancer ([place])   . Macular degeneration   . On home oxygen therapy    "3L; all the time" (04/30/2018)  . Osteoarthritis   . Osteoporosis   . Pneumonia    years ago  . Pulmonary embolism ([place]) 04/30/2018  . Reactive airway disease   . Rupture of bowel ([place])   . Sepsis ([place]) 10/08/2014  . Stroke [facility])    "mini stroke"- balance and memory issue  . Stroke ([place])   . Wrist pain, acute 09/10/2012    Patient Active Problem List   Diagnosis Date Noted  . Urinary frequency 03/09/2020  . Squamous cell carcinoma of lung, right ([place]) 02/25/2020  . Goals of care, counseling/discussion 02/25/2020  . Pulmonary emphysema ([place]) 11/11/2019  . Loose total hip arthroplasty ([place]) 07/07/2019  . Non-intractable vomiting 05/19/2019  . Anemia 05/04/2019  . History of deep vein thrombosis of lower extremity 01/05/2019  . GI bleed 10/29/2018  . ILD (interstitial lung disease) ([place]) 08/12/2018  . COPD exacerbation ([place]) 05/17/2018  . Pressure injury of skin 05/04/2018  . PE (pulmonary thromboembolism) ([place]) 04/30/2018  . Acute metabolic encephalopathy   . Respiratory failure with hypoxia ([place]) 04/20/2018  . CAP (community acquired pneumonia) 04/20/2018  . Chronic lower extremity pain (Right) 04/07/2018  . Abnormal MRI, lumbar spine 04/07/2018  . Lumbar facet hypertrophy (Multilevel) (Bilateral) 04/07/2018  . Lumbar foraminal stenosis (L3-4, L4-5) (  Bilateral) 04/07/2018  . Annular tear of lumbar disc (L3-4, L4-5) 04/07/2018  . Closed hip fracture, sequela (Right) 02/03/2018  . It band syndrome, right 12/18/2017  . History of hip replacement (Right) 12/18/2017  . Ileus ([place]) 10/27/2017  . Primary osteoarthritis of hip 09/30/2017  . History of small bowel obstruction 09/10/2017  . 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 SBO (small bowel obstruction) ([place]) 08/24/2017  . Abnormal x-ray of pelvis 08/13/2017  .  Other specified dorsopathies, sacral and sacrococcygeal region 08/04/2017  . Spondylosis without myelopathy or radiculopathy, lumbosacral region 07/17/2017  . Non-traumatic compression fracture of T3 thoracic vertebra, sequela 07/02/2017  . High risk medication use 07/02/2017  . At high risk for falls 07/02/2017  . Chronic sacroiliac joint pain (Right) 07/02/2017  . CKD (chronic kidney disease) stage 3, GFR 30-59 ml/min (HCC) 04/23/2017  . Chronic knee pain s/p total knee replacement (TKR) (Right) 04/02/2017  . DDD (degenerative disc disease), lumbar 03/05/2017  . Chronic knee pain (Bilateral) (R>L) 03/05/2017  . Osteoarthritis 03/05/2017  . Chronic musculoskeletal pain 03/05/2017  . Neurogenic pain 03/05/2017  . Chronic myofascial pain 02/12/2017  . Lumbar facet syndrome (Bilateral) (L>R) 02/12/2017  . Long term prescription benzodiazepine use 02/12/2017  . Disorder of skeletal system 02/12/2017  . Pharmacologic therapy 02/12/2017  . Problems influencing health status 02/12/2017  . 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 NSAID long-term use 02/12/2017  . DDD (degenerative disc disease), cervical 11/11/2016  . Lumbar facet arthropathy (Bilateral) 11/11/2016  . 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 CAFL (chronic airflow limitation) ([place]) 11/07/2016  . Depression, major, in partial remission ([place]) 11/07/2016  . 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 Long term current use of opiate analgesic 11/07/2016  . Long term prescription opiate use 11/07/2016  . Chronic pain syndrome 11/07/2016  . Chronic neck pain (Secondary Area of Pain) (Bilateral) (L>R) 11/07/2016  . Chronic hip pain (Right) 11/07/2016  . Age-related osteoporosis with current pathological fracture with routine healing 09/20/2016  . History of stroke 09/20/2016  . Intertrochanteric fracture of femur, sequela (Right) 09/13/2016  . Chronic shoulder pain (Bilateral) 08/12/2016  . Hx of total knee  replacement (Bilateral) 04/04/2016  . Chronic shoulder pain (Left) 02/28/2016  . Hoarseness 01/12/2016  . Pharyngoesophageal dysphagia 01/12/2016  . Chronic low back pain [facility] Area of Pain) (Bilateral) (L>R) 10/26/2015  . 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 Syncope, non cardiac 05/22/2015  . Thrombocytopenia ([place]) 10/07/2014  . Hypoxia 10/05/2014  . Anxiety 06/15/2014  . Duodenogastric reflux 06/15/2014  . Benign essential HTN 06/15/2014  . Hypercholesteremia 07/30/2013  . HTN (hypertension) 07/30/2013  . GERD (gastroesophageal reflux disease) 07/30/2013  . Memory loss 05/03/2013  . Chronic knee pain (Primary Area of Pain) (Right) 09/10/2012    Past Surgical History:  Procedure Laterality Date  . ABDOMINAL HYSTERECTOMY    . ABDOMINAL SURGERY    . APPENDECTOMY    . BLADDER SURGERY    . CATARACT EXTRACTION W/ INTRAOCULAR LENS  IMPLANT, BILATERAL Bilateral   . CHOLECYSTECTOMY    . COLON SURGERY    . COLOSTOMY REVERSAL    . ESOPHAGOGASTRODUODENOSCOPY N/A 10/30/2018   Procedure: ESOPHAGOGASTRODUODENOSCOPY (EGD);  Surgeon: Lin Landsman, MD;  Location: [facility] ENDOSCOPY;  Service: Gastroenterology;  Laterality: N/A;  . INTRAMEDULLARY (IM) NAIL INTERTROCHANTERIC Right 09/13/2016   Procedure: INTRAMEDULLARY (IM) NAIL INTERTROCHANTRIC RIGHT;  Surgeon: Leandrew Koyanagi, MD;  Location: [facility];  Service: Orthopedics;  Laterality: Right;  . JOINT REPLACEMENT Bilateral   . JOINT REPLACEMENT    . KNEE SURGERY    . OSTOMY    . PORTA CATH INSERTION N/A 03/15/2020   Procedure: PORTA CATH INSERTION;  Surgeon: Katha Cabal, MD;  Location: [facility] CV LAB;  Service: Cardiovascular;  Laterality: N/A;  . RECTOCELE REPAIR    . TOE SURGERY Right    3rd  . TOTAL HIP ARTHROPLASTY Right 09/30/2017   Procedure: RIGHT HIP IM NAIL REMOVAL WITH CONVERSION TO TOTAL HIP ARTHOPLASTY, anterior approach;  Surgeon: Renette Butters, MD;  Location: [facility];  Service: Orthopedics;  Laterality: Right;     OB History   No obstetric history on file.     Family History  Problem Relation Age of Onset  . Heart failure Mother   . Heart attack Father   . Lung cancer Sister   . Deep vein thrombosis Sister   . Pulmonary embolism Son   . Deep vein thrombosis Son   . Cervical cancer Daughter     Social History   Tobacco Use  . Smoking status: Former Smoker    Packs/day: 1.00    Years: 30.00    Pack years: 30.00    Types: Cigarettes    Quit date: 1990    Years since quitting: 31.8  . Smokeless tobacco: Never Used  Vaping Use  . Vaping Use: Former  Substance Use Topics  . Alcohol use: Not Currently  . Drug use: Not Currently    Home Medications Prior to Admission medications   Medication Sig Start Date End Date Taking? Authorizing Provider  acetaminophen (TYLENOL) 500 MG tablet Take 2 tablets (1,000 mg total) by mouth in the morning, at noon, and at bedtime. Patient taking differently: Take 1,000 mg by mouth in the morning and at bedtime.  02/03/20   Duffy Bruce, MD  albuterol (ACCUNEB) 1.25 MG/3ML nebulizer solution Take 3 mLs (1.25 mg total) by nebulization every 6 (six) hours as needed for wheezing. 08/26/18   [name], FNP  albuterol (VENTOLIN HFA) 108 (90 Base) MCG/ACT inhaler Inhale 2 puffs into the lungs every 6 (six) hours as needed for wheezing or shortness of breath. 09/25/18   [name], FNP  alendronate (FOSAMAX) 70 MG tablet TAKE 1 TABLET BY MOUTH ONCE A WEEK Patient taking differently: Take 70 mg by mouth every Sunday.  06/09/19   [name], FNP  ALPRAZolam (XANAX) 1 MG tablet TAKE 0.5-1 TABLETS (0.5-1 MG TOTAL) BY MOUTH 2 (TWO) TIMES DAILY AS NEEDED FOR ANXIETY. 04/04/20   [name], FNP  amLODipine (NORVASC) 5 MG tablet TAKE 1 TABLET BY MOUTH EVERY DAY 03/31/20   [name], FNP  Cyanocobalamin (VITAMIN B-12) 2500 MCG SUBL Take 2,500 mcg by mouth  daily.     [provider]  docusate sodium (COLACE) 100 MG capsule Take 100 mg by mouth daily.     [provider]  DULoxetine (CYMBALTA) 60 MG capsule Take 1 capsule (60 mg total) by mouth daily. 03/13/20   [name], FNP  ELIQUIS 2.5 MG TABS tablet TAKE 1 TABLET BY MOUTH TWICE A DAY Patient taking differently: Take 2.5 mg by mouth in the morning and at bedtime.  03/01/20   Earlie Server, MD  esomeprazole (NEXIUM) 40 MG capsule TAKE 1 CAPSULE BY MOUTH EVERY DAY 03/17/20   [name], FNP  fentaNYL (DURAGESIC) 25 MCG/HR Place 1 patch onto the skin every  3 (three) days. 02/11/20   [provider]  Ferrous Gluconate (IRON 27 PO) Take 27 mg by mouth 2 (two) times a week.    [provider]  FLOVENT HFA 220 MCG/ACT inhaler TAKE 1 PUFF BY MOUTH TWICE A DAY Patient taking differently: Inhale 1 puff into the lungs 2 (two) times daily as needed.  12/14/19   [name], FNP  fluticasone (FLONASE) 50 MCG/ACT nasal spray SPRAY 2 SPRAYS INTO EACH NOSTRIL EVERY DAY Patient taking differently: Place 2 sprays into both nostrils daily as needed.  08/25/19   [name], FNP  lidocaine-prilocaine (EMLA) cream Apply 1 application topically as needed. Apply to port and cover with saran wrap 1-2 hours prior to port access 03/20/20   Earlie Server, MD  LINZESS 290 MCG CAPS capsule TAKE 1 CAPSULE BY MOUTH EVERY DAY 03/27/20   [name], FNP  lovastatin (MEVACOR) 10 MG tablet TAKE 1 TABLET BY MOUTH EVERY DAY Patient taking differently: Take 10 mg by mouth daily.  03/02/20   [name], FNP  mirtazapine (REMERON) 7.5 MG tablet TAKE 1 TABLET (7.5 MG TOTAL) BY MOUTH AT BEDTIME. 02/01/20   Bedsole, Amy E, MD  Multiple Minerals-Vitamins (CAL MAG ZINC +D3 PO) Take 1 tablet by mouth daily. Patient not taking: Reported on 03/29/2020    [provider]  NARCAN 4 MG/0.1ML LIQD nasal spray kit Place 1 spray into the nose daily as needed (accidental  overdose).  Patient not taking: Reported on 03/29/2020 09/10/18   [provider]  ondansetron (ZOFRAN ODT) 4 MG disintegrating tablet Take 1 tablet (4 mg total) by mouth every 8 (eight) hours as needed for nausea or vomiting. 06/12/18   [name], FNP  orphenadrine (NORFLEX) 100 MG tablet Take 100 mg by mouth 2 (two) times daily as needed for muscle spasms. 1/4 every 2 days 11/11/19   [provider]  Oxycodone HCl 10 MG TABS Take 10 mg by mouth every 8 (eight) hours as needed (pain.).  09/23/18   [provider]  polyethylene glycol (MIRALAX / GLYCOLAX) packet Take 17 g by mouth daily.     [provider]  potassium chloride (KLOR-CON) 10 MEQ tablet TAKE 1 TABLET BY MOUTH 2 TIMES DAILY. 03/31/20   Verlon Au, NP  pregabalin (LYRICA) 100 MG capsule Take 1 capsule (100 mg total) by mouth 2 (two) times daily. 03/01/20   [name], FNP  prochlorperazine (COMPAZINE) 10 MG tablet Take 1 tablet (10 mg total) by mouth every 6 (six) hours as needed (Nausea or vomiting). 02/25/20   Earlie Server, MD  promethazine (PHENERGAN) 12.5 MG tablet TAKE 1 TABLET (12.5 MG TOTAL) BY MOUTH EVERY 8 (EIGHT) HOURS AS NEEDED FOR NAUSEA OR VOMITING. 04/04/20   [name], FNP  zafirlukast (ACCOLATE) 20 MG tablet Take 1 tablet (20 mg total) by mouth 2 (two) times daily before a meal. 03/29/20   Earlie Server, MD    Allergies    Ampicillin, Ampicillin-sulbactam sodium, Ambien [zolpidem tartrate], Cyclobenzaprine, Tape, Toradol [ketorolac tromethamine], Sulfamethoxazole-trimethoprim, Cephalexin, and Tramadol  Review of Systems   Review of Systems  Constitutional:       Per HPI, otherwise negative  HENT: Negative.   Eyes: Negative.   Respiratory: Positive for cough and shortness of breath.   Cardiovascular: Negative for chest pain.  Gastrointestinal: Negative for vomiting.  Genitourinary: Negative.   Musculoskeletal: Negative.   Skin: Negative for rash.   Allergic/Immunologic: Positive for immunocompromised state.  Neurological: Positive for weakness.  Hematological:       Per HPI, otherwise negative    Physical Exam Updated Vital Signs BP (!) 111/96   Pulse (!) 115   Temp 98.7 F (37.1 C)   Resp 19   SpO2 95%   Physical Exam Vitals and nursing note reviewed.  Constitutional:      Appearance: She is well-developed. She is ill-appearing.  HENT:     Head: Normocephalic and atraumatic.  Eyes:     Conjunctiva/sclera: Conjunctivae normal.  Cardiovascular:     Rate and Rhythm: Regular rhythm. Tachycardia present.     Pulses: Normal pulses.  Pulmonary:     Effort: Tachypnea present.     Breath sounds: No stridor. Decreased breath sounds and wheezing present.  Abdominal:     General: There is no distension.  Skin:    General: Skin is warm and dry.  Neurological:     Mental Status: She is alert.     Cranial Nerves: No cranial nerve deficit.     Motor: Atrophy present. No tremor.  Psychiatric:        Behavior: Behavior is slowed and withdrawn.        Cognition and Memory: Memory is impaired.     ED Results / Procedures / Treatments   Labs (all labs ordered are listed, but only abnormal results are displayed) Labs Reviewed  COMPREHENSIVE METABOLIC PANEL - Abnormal; Notable for the following components:      Result Value   Albumin 2.8 (*)    All other components within normal limits  CBC WITH DIFFERENTIAL/PLATELET - Abnormal; Notable for the following components:   Hemoglobin 11.1 (*)    MCH 25.8 (*)    MCHC 29.2 (*)    RDW 18.6 (*)    All other components within normal limits  URINALYSIS, ROUTINE W REFLEX MICROSCOPIC - Abnormal; Notable for the following components:   Color, Urine STRAW (*)    Leukocytes,Ua TRACE (*)    Bacteria, UA RARE (*)    All other components within normal limits  D-DIMER, QUANTITATIVE (NOT AT [facility]) - Abnormal; Notable for the following components:   D-Dimer, Quant 2.30 (*)    All other  components within normal limits  CULTURE, BLOOD (ROUTINE X 2)  CULTURE, BLOOD (ROUTINE X 2)  BRAIN NATRIURETIC PEPTIDE    EKG Sinus tach, 117, PVC, artifact, Q waves inferiorly, possible prior injury, abnormal EKG  Radiology CT Angio Chest PE W/Cm &/Or Wo Cm  Result Date: 04/11/2020 CLINICAL DATA:  Tachycardia EXAM: CT ANGIOGRAPHY CHEST WITH CONTRAST TECHNIQUE: Multidetector CT imaging of the chest was performed using the standard protocol during bolus administration of intravenous contrast. Multiplanar CT image reconstructions and MIPs were obtained to evaluate the vascular anatomy. CONTRAST:  118m OMNIPAQUE IOHEXOL 350 MG/ML SOLN COMPARISON:  03/24/2020 FINDINGS: Cardiovascular: No filling defects in the pulmonary arteries to suggest pulmonary emboli. Scattered coronary artery and aortic calcifications. No aneurysm. Mediastinum/Nodes: No mediastinal, hilar, or axillary adenopathy. Trachea and esophagus are unremarkable. Thyroid unremarkable. Lungs/Pleura: Background of fibrosis again noted throughout the lungs. Rounded pleural base mass again noted posteriorly in the right lower lobe measuring up to 3.8 cm, stable since prior study. No effusions. Upper Abdomen: No change since recent study. Musculoskeletal: No acute bony abnormality. Old posterior right 7th rib fracture. No acute bony abnormality. Multilevel compression fractures in the upper thoracic spine. Review of the MIP images confirms the above findings. IMPRESSION: No evidence of pulmonary embolus. Background of fibrosis within the  lungs. 3.8 cm rounded necrotic mass seen in the posterior segment of the right lower lobe, stable since prior study. Coronary artery disease. Aortic Atherosclerosis (ICD10-I70.0). Electronically Signed   By: Rolm Baptise M.D.   On: 04/11/2020 18:40   DG Chest Port 1 View  Result Date: 04/11/2020 CLINICAL DATA:  Shortness of breath. EXAM: PORTABLE CHEST 1 VIEW COMPARISON:  CT chest 03/24/2020.  Chest  radiographs 02/03/2020. FINDINGS: Similar cardiomediastinal contour. Right IJ approach Port-A-Cath with the tip projecting at the inferior SVC. Rounded density projecting over the right medial lung base corresponds to pleural mass better characterized on prior CT chest. Peripheral prominent interstitial opacities likely relate to fibrosis seen on recent CT chest. No confluent consolidation. No visible pleural effusions or pneumothorax. No acute osseous abnormality. IMPRESSION: 1. Similar radiographic appearance of the chest. 2. Rounded density projecting over the right medial lung base corresponds to pleural mass better characterized on prior CT chest. CT could better evaluate for disease progression if clinically indicated. Electronically Signed   By: Margaretha Sheffield MD   On: 04/11/2020 16:44    Procedures Procedures (including critical care time)  Medications Ordered in ED Medications  iohexol (OMNIPAQUE) 350 MG/ML injection 100 mL (100 mLs Intravenous Contrast Given 04/11/20 1814)    ED Course  I have reviewed the triage vital signs and the nursing notes.  Pertinent labs & imaging results that were available during my care of the patient were reviewed by me and considered in my medical decision making (see chart for details).     Elderly female with ongoing radiation therapy, recent chemo for squamous cell malignancy of her lung, now presents after recent Covid diagnosis with tachycardia, tachypnea, cough, possibly chest pain. Differential including infection, progression of disease, metabolic causes considered, x-ray, labs ordered. Patient placed on continuous cardiac monitor, pulse oximetry. Cardiac monitor 115 sinus tach abnormal Pulse oximetry 91% ra abnormal  Update:, Initial labs notable for elevated D-dimer, with history of PE, and presentation today for dyspnea, CT scan will be performed. 7:45 PM Have reviewed the CT images, no evidence for PE, no evidence for progression of  disease. Other labs now resulted as well, reviewed, generally reassuring, consistent with prior studies.  She and I discussed the findings, though she is mildly tachycardic, she is awake, alert, with no increased work of breathing, gives me a hug when provided the results, states that she is ready to go home.  Final Clinical Impression(s) / ED Diagnoses Final diagnoses:  SOB (shortness of breath)  Tachycardia   MDM Number of Diagnoses or Management Options SOB (shortness of breath): established, worsening Tachycardia: established, worsening   Amount and/or Complexity of Data Reviewed Clinical lab tests: reviewed Tests in the radiology section of CPT: reviewed Tests in the medicine section of CPT: reviewed Decide to obtain previous medical records or to obtain history from someone other than the patient: yes Obtain history from someone other than the patient: yes Review and summarize past medical records: yes Independent visualization of images, tracings, or specimens: yes  Risk of Complications, Morbidity, and/or Mortality Presenting problems: high Diagnostic procedures: high Management options: high  Critical Care Total time providing critical care: < 30 minutes  Patient Progress Patient progress: stable    Carmin Muskrat, MD 04/11/20 1950

## 2020-04-11 NOTE — Discharge Instructions (Signed)
10 Things You Can Do to Manage Your COVID-19 Symptoms at Home If you have possible or confirmed COVID-19: 1. Stay home from work and school. And stay away from other public places. If you must go out, avoid using any kind of public transportation, ridesharing, or taxis. 2. Monitor your symptoms carefully. If your symptoms get worse, call your healthcare provider immediately. 3. Get rest and stay hydrated. 4. If you have a medical appointment, call the healthcare provider ahead of time and tell them that you have or may have COVID-19. 5. For medical emergencies, call 911 and notify the dispatch personnel that you have or may have COVID-19. 6. Cover your cough and sneezes with a tissue or use the inside of your elbow. 7. Wash your hands often with soap and water for at least 20 seconds or clean your hands with an alcohol-based hand sanitizer that contains at least 60% alcohol. 8. As much as possible, stay in a specific room and away from other people in your home. Also, you should use a separate bathroom, if available. If you need to be around other people in or outside of the home, wear a mask. 9. Avoid sharing personal items with other people in your household, like dishes, towels, and bedding. 10. Clean all surfaces that are touched often, like counters, tabletops, and doorknobs. Use household cleaning sprays or wipes according to the label instructions. [email or web address].[email or web address] 11/25/2018 This information is not intended to replace advice given to you by your health care provider. Make sure you discuss any questions you have with your health care provider. Document Revised: 04/29/2019 Document Reviewed: 04/29/2019 Elsevier Patient Education  2020 [place].   COVID-19 COVID-19 is a respiratory infection that is caused by a virus called severe acute respiratory syndrome coronavirus 2 (SARS-CoV-2). The disease is also known as coronavirus disease or novel coronavirus. In some people, the virus may  not cause any symptoms. In others, it may cause a serious infection. The infection can get worse quickly and can lead to complications, such as:  Pneumonia, or infection of the lungs.  Acute respiratory distress syndrome or ARDS. This is a condition in which fluid build-up in the lungs prevents the lungs from filling with air and passing oxygen into the blood.  Acute respiratory failure. This is a condition in which there is not enough oxygen passing from the lungs to the body or when carbon dioxide is not passing from the lungs out of the body.  Sepsis or septic shock. This is a serious bodily reaction to an infection.  Blood clotting problems.  Secondary infections due to bacteria or fungus.  Organ failure. This is when your body's organs stop working. The virus that causes COVID-19 is contagious. This means that it can spread from person to person through droplets from coughs and sneezes (respiratory secretions). What are the causes? This illness is caused by a virus. You may catch the virus by:  Breathing in droplets from an infected person. Droplets can be spread by a person breathing, speaking, singing, coughing, or sneezing.  Touching something, like a table or a doorknob, that was exposed to the virus (contaminated) and then touching your mouth, nose, or eyes. What increases the risk? Risk for infection You are more likely to be infected with this virus if you:  Are within 6 feet (2 meters) of a person with COVID-19.  Provide care for or live with a person who is infected with COVID-19.  Spend time in crowded indoor spaces or  live in shared housing. Risk for serious illness You are more likely to become seriously ill from the virus if you:  Are 69 years of age or older. The higher your age, the more you are at risk for serious illness.  Live in a nursing home or long-term care facility.  Have cancer.  Have a long-term (chronic) disease such as: ? Chronic lung disease,  including chronic obstructive pulmonary disease or asthma. ? A long-term disease that lowers your body's ability to fight infection (immunocompromised). ? Heart disease, including heart failure, a condition in which the arteries that lead to the heart become narrow or blocked (coronary artery disease), a disease which makes the heart muscle thick, weak, or stiff (cardiomyopathy). ? Diabetes. ? Chronic kidney disease. ? Sickle cell disease, a condition in which red blood cells have an abnormal "sickle" shape. ? Liver disease.  Are obese. What are the signs or symptoms? Symptoms of this condition can range from mild to severe. Symptoms may appear any time from 2 to 14 days after being exposed to the virus. They include:  A fever or chills.  A cough.  Difficulty breathing.  Headaches, body aches, or muscle aches.  Runny or stuffy (congested) nose.  A sore throat.  New loss of taste or smell. Some people may also have stomach problems, such as nausea, vomiting, or diarrhea. Other people may not have any symptoms of COVID-19. How is this diagnosed? This condition may be diagnosed based on:  Your signs and symptoms, especially if: ? You live in an area with a COVID-19 outbreak. ? You recently traveled to or from an area where the virus is common. ? You provide care for or live with a person who was diagnosed with COVID-19. ? You were exposed to a person who was diagnosed with COVID-19.  A physical exam.  Lab tests, which may include: ? Taking a sample of fluid from the back of your nose and throat (nasopharyngeal fluid), your nose, or your throat using a swab. ? A sample of mucus from your lungs (sputum). ? Blood tests.  Imaging tests, which may include, X-rays, CT scan, or ultrasound. How is this treated? At present, there is no medicine to treat COVID-19. Medicines that treat other diseases are being used on a trial basis to see if they are effective against COVID-19. Your  health care provider will talk with you about ways to treat your symptoms. For most people, the infection is mild and can be managed at home with rest, fluids, and over-the-counter medicines. Treatment for a serious infection usually takes places in a hospital intensive care unit (ICU). It may include one or more of the following treatments. These treatments are given until your symptoms improve.  Receiving fluids and medicines through an IV.  Supplemental oxygen. Extra oxygen is given through a tube in the nose, a face mask, or a hood.  Positioning you to lie on your stomach (prone position). This makes it easier for oxygen to get into the lungs.  Continuous positive airway pressure (CPAP) or bi-level positive airway pressure (BPAP) machine. This treatment uses mild air pressure to keep the airways open. A tube that is connected to a motor delivers oxygen to the body.  Ventilator. This treatment moves air into and out of the lungs by using a tube that is placed in your windpipe.  Tracheostomy. This is a procedure to create a hole in the neck so that a breathing tube can be inserted.  Extracorporeal membrane  oxygenation (ECMO). This procedure gives the lungs a chance to recover by taking over the functions of the heart and lungs. It supplies oxygen to the body and removes carbon dioxide. Follow these instructions at home: Lifestyle  If you are sick, stay home except to get medical care. Your health care provider will tell you how long to stay home. Call your health care provider before you go for medical care.  Rest at home as told by your health care provider.  Do not use any products that contain nicotine or tobacco, such as cigarettes, e-cigarettes, and chewing tobacco. If you need help quitting, ask your health care provider.  Return to your normal activities as told by your health care provider. Ask your health care provider what activities are safe for you. General  instructions  Take over-the-counter and prescription medicines only as told by your health care provider.  Drink enough fluid to keep your urine pale yellow.  Keep all follow-up visits as told by your health care provider. This is important. How is this prevented?  There is no vaccine to help prevent COVID-19 infection. However, there are steps you can take to protect yourself and others from this virus. To protect yourself:   Do not travel to areas where COVID-19 is a risk. The areas where COVID-19 is reported change often. To identify high-risk areas and travel restrictions, check the CDC travel website: FatFares.com.br  If you live in, or must travel to, an area where COVID-19 is a risk, take precautions to avoid infection. ? Stay away from people who are sick. ? Wash your hands often with soap and water for 20 seconds. If soap and water are not available, use an alcohol-based hand sanitizer. ? Avoid touching your mouth, face, eyes, or nose. ? Avoid going out in public, follow guidance from your state and local health authorities. ? If you must go out in public, wear a cloth face covering or face mask. Make sure your mask covers your nose and mouth. ? Avoid crowded indoor spaces. Stay at least 6 feet (2 meters) away from others. ? Disinfect objects and surfaces that are frequently touched every day. This may include:  Counters and tables.  Doorknobs and light switches.  Sinks and faucets.  Electronics, such as phones, remote controls, keyboards, computers, and tablets. To protect others: If you have symptoms of COVID-19, take steps to prevent the virus from spreading to others.  If you think you have a COVID-19 infection, contact your health care provider right away. Tell your health care team that you think you may have a COVID-19 infection.  Stay home. Leave your house only to seek medical care. Do not use public transport.  Do not travel while you are  sick.  Wash your hands often with soap and water for 20 seconds. If soap and water are not available, use alcohol-based hand sanitizer.  Stay away from other members of your household. Let healthy household members care for children and pets, if possible. If you have to care for children or pets, wash your hands often and wear a mask. If possible, stay in your own room, separate from others. Use a different bathroom.  Make sure that all people in your household wash their hands well and often.  Cough or sneeze into a tissue or your sleeve or elbow. Do not cough or sneeze into your hand or into the air.  Wear a cloth face covering or face mask. Make sure your mask covers your nose  and mouth. Where to find more information  Centers for Disease Control and Prevention: PurpleGadgets.be  World Health Organization: https://www.castaneda.info/ Contact a health care provider if:  You live in or have traveled to an area where COVID-19 is a risk and you have symptoms of the infection.  You have had contact with someone who has COVID-19 and you have symptoms of the infection. Get help right away if:  You have trouble breathing.  You have pain or pressure in your chest.  You have confusion.  You have bluish lips and fingernails.  You have difficulty waking from sleep.  You have symptoms that get worse. These symptoms may represent a serious problem that is an emergency. Do not wait to see if the symptoms will go away. Get medical help right away. Call your local emergency services (911 in the [place] Do not drive yourself to the hospital. Let the emergency medical personnel know if you think you have COVID-19. Summary  COVID-19 is a respiratory infection that is caused by a virus. It is also known as coronavirus disease or novel coronavirus. It can cause serious infections, such as pneumonia, acute respiratory distress syndrome, acute respiratory failure,  or sepsis.  The virus that causes COVID-19 is contagious. This means that it can spread from person to person through droplets from breathing, speaking, singing, coughing, or sneezing.  You are more likely to develop a serious illness if you are 35 years of age or older, have a weak immune system, live in a nursing home, or have chronic disease.  There is no medicine to treat COVID-19. Your health care provider will talk with you about ways to treat your symptoms.  Take steps to protect yourself and others from infection. Wash your hands often and disinfect objects and surfaces that are frequently touched every day. Stay away from people who are sick and wear a mask if you are sick. This information is not intended to replace advice given to you by your health care provider. Make sure you discuss any questions you have with your health care provider. Document Revised: 03/12/2019 Document Reviewed: 06/18/2018 Elsevier Patient Education  [place].  What types of side effects do monoclonal antibody drugs cause?  Common side effects  In general, the more common side effects caused by monoclonal antibody drugs include: . Allergic reactions, such as hives or itching . Flu-like signs and symptoms, including chills, fatigue, fever, and muscle aches and pains . Nausea, vomiting . Diarrhea . Skin rashes . Low blood pressure   The CDC is recommending patients who receive monoclonal antibody treatments wait at least 90 days before being vaccinated.  Currently, there are no data on the safety and efficacy of mRNA COVID-19 vaccines in persons who received monoclonal antibodies or convalescent plasma as part of COVID-19 treatment. Based on the estimated half-life of such therapies as well as evidence suggesting that reinfection is uncommon in the 90 days after initial infection, vaccination should be deferred for at least 90 days, as a precautionary measure until additional information becomes  available, to avoid interference of the antibody treatment with vaccine-induced immune responses.

## 2020-04-11 NOTE — Discharge Instructions (Signed)
As discussed, your evaluation today has been largely reassuring.  But, it is important that you monitor your condition carefully, and do not hesitate to return to the ED if you develop new, or concerning changes in your condition. ? ?Otherwise, please follow-up with your physician for appropriate ongoing care. ? ?

## 2020-04-11 NOTE — ED Triage Notes (Signed)
Pt arrived from infusion clinic. Concern of episodes of tachycardia and bradycardia there. Pt tachycardic upon arrival. Denies any issues. Hx of dementia.

## 2020-04-12 ENCOUNTER — Ambulatory Visit: Payer: Medicare Other

## 2020-04-12 ENCOUNTER — Inpatient Hospital Stay: Payer: Medicare Other

## 2020-04-12 ENCOUNTER — Telehealth: Payer: Self-pay

## 2020-04-12 NOTE — Telephone Encounter (Signed)
[name] - Client TELEPHONE ADVICE RECORD [facility] Patient Name: [name] Gender: Female DOB: Mar 15, 1941 Age: 79 Y 31 M 60 D Return Phone Number: [phone] (Primary) Address: City/State/Zip: [place] [address] Client [facility] - Client Client Site [facility] - Day Physician [name]- NP Contact Type Call Who Is Calling Patient / Member / Family / Caregiver Call Type Triage / Clinical Caller Name [name] Relationship To Patient Daughter Return Phone Number [phone] (Primary) Chief Complaint Heart palpitations or irregular heartbeat Reason for Call Symptomatic / Request for Health Information Initial Comment Her mother Has covid, just had the antibody treatment, she can not control her bladder, is very sleepy and having heart palpations. Translation No Nurse Assessment Nurse: Raphael Gibney, RN, Vera Date/Time (Eastern Time): 04/12/2020 1:07:44 PM Confirm and document reason for call. If symptomatic, describe symptoms. ---Caller states her mother has COVID and she had the antibody treatment yesterday. she is incontinent of urine. she is sleepy and is not answering her daughter. heart rate is high. Had some chest pain this am. Does the patient have any new or worsening symptoms? ---Yes Will a triage be completed? ---Yes Related visit to physician within the last 2 weeks? ---No Does the PT have any chronic conditions? (i.e. diabetes, asthma, this includes High risk factors for pregnancy, etc.) ---Yes List chronic conditions. ---cancer; COPD; HTN Is this a behavioral health or substance abuse call? ---No Guidelines Guideline Title Affirmed Question Affirmed Notes Nurse Date/Time (Eastern Time) Confusion - Delirium [1] Difficult to awaken or acting confused (e.g., disoriented, slurred speech) AND [2] present now AND [3] new-onset Raphael Gibney, RN, Vera 04/12/2020 1:10:55 PM Disp.  Time [name] Time) Disposition Final User 04/12/2020 1:20:25 PM 911 Outcome Documentation Stringer, RN, Vanita Ingles PLEASE NOTE: All timestamps contained within this report are represented as [place] Standard Time. CONFIDENTIALTY NOTICE: This fax transmission is intended only for the addressee. It contains information that is legally privileged, confidential or otherwise protected from use or disclosure. If you are not the intended recipient, you are strictly prohibited from reviewing, disclosing, copying using or disseminating any of this information or taking any action in reliance on or regarding this information. If you have received this fax in error, please notify us immediately by telephone so that we can arrange for its return to [place]. Phone: [phone], Toll-Free: [phone], Fax: [phone] Page: 2 of 2 Call Id: [id number] [place]. Time [name] Time) Disposition Final User Reason: attempted 911 outcome documentation call with no answer to the phone 04/12/2020 1:13:18 PM Call EMS 911 Now Yes Raphael Gibney, RN, Doreatha Lew Disagree/Comply Disagree Caller Understands Yes PreDisposition Call Doctor Care Advice Given Per Guideline CALL EMS 911 NOW: * Immediate medical attention is needed. You need to hang up and call 911 (or an ambulance). CARE ADVICE given per Confusion-Delirium (Adult) guideline. Comments User: Dannielle Burn, RN Date/Time [name] Time): 04/12/2020 1:19:48 PM Called back line and spoke to Reena and gave report pt has COVID and she had the antibody treatment yesterday. she is incontinent of urine. she is sleepy and is not answering her daughter. heart rate is high. Had some chest pain this am. Triage outcome call 911 but daughter states mother does not want to call 911 or go to the ER. States she will call pt back Referrals GO TO FACILITY REFUSED

## 2020-04-12 NOTE — Telephone Encounter (Signed)
[name] with access nurse said pt had monoclonal antibody infusion on 04/11/20 and heart rate went up and then pt taken to [name] for several hours. Pt has + covid; pt is incontinent of urine. when pt realizes she has to urinate the urgency is so bad pt just starts urinating right away.  No burning or pain upon urination, no back or abd pain and no increased frequency.  At times when [name] is talking with pt she has to ask her 4 or more times before pt responds. Pt does not have CP now but did have right sided CP earlier this morning.[name] said all these symptoms either started or worsened after infusion on 04/11/20. Pt is at home now; [name] (DPR signed) was advised by [name] pt needed to go to ED.[name] said pt would not go. I spoke with [name] and pt (on speaker phone). The above info was confirmed and pt did agree to go to [name] ED. Sending to [name] who is out of office and [name] who is in office.

## 2020-04-12 NOTE — Telephone Encounter (Signed)
I spoke with [name]; [name] said that pt has decided to wait until the morning to go to ED. [name] put pt on speaker phone and pt said she was not going to ED tonight that she would go in morning. FYI to Dr Einar Pheasant and [facility] CMA.

## 2020-04-12 NOTE — Telephone Encounter (Signed)
Noted agree with ER evaluation

## 2020-04-12 NOTE — Telephone Encounter (Signed)
Again recommend ER. Routing back to nurse to see if she can convince patient to go

## 2020-04-13 ENCOUNTER — Emergency Department (HOSPITAL_COMMUNITY): Payer: Medicare Other

## 2020-04-13 ENCOUNTER — Ambulatory Visit: Payer: Medicare Other

## 2020-04-13 ENCOUNTER — Encounter (HOSPITAL_COMMUNITY): Payer: Self-pay | Admitting: Internal Medicine

## 2020-04-13 ENCOUNTER — Other Ambulatory Visit: Payer: Self-pay

## 2020-04-13 ENCOUNTER — Inpatient Hospital Stay (HOSPITAL_COMMUNITY)
Admission: EM | Admit: 2020-04-13 | Discharge: 2020-04-26 | DRG: 177 | Disposition: A | Discharge status: Hospice | Payer: Medicare Other | Attending: Internal Medicine | Admitting: Internal Medicine

## 2020-04-13 ENCOUNTER — Inpatient Hospital Stay: Payer: Medicare Other

## 2020-04-13 DIAGNOSIS — U071 COVID-19: Principal | ICD-10-CM

## 2020-04-13 DIAGNOSIS — S199XXA Unspecified injury of neck, initial encounter: Secondary | ICD-10-CM | POA: Diagnosis not present

## 2020-04-13 DIAGNOSIS — Z9114 Patient's other noncompliance with medication regimen: Secondary | ICD-10-CM

## 2020-04-13 DIAGNOSIS — Y92009 Unspecified place in unspecified non-institutional (private) residence as the place of occurrence of the external cause: Secondary | ICD-10-CM

## 2020-04-13 DIAGNOSIS — D630 Anemia in neoplastic disease: Secondary | ICD-10-CM | POA: Diagnosis not present

## 2020-04-13 DIAGNOSIS — J701 Chronic and other pulmonary manifestations due to radiation: Secondary | ICD-10-CM | POA: Diagnosis present

## 2020-04-13 DIAGNOSIS — S0990XA Unspecified injury of head, initial encounter: Secondary | ICD-10-CM | POA: Diagnosis not present

## 2020-04-13 DIAGNOSIS — J441 Chronic obstructive pulmonary disease with (acute) exacerbation: Secondary | ICD-10-CM | POA: Diagnosis not present

## 2020-04-13 DIAGNOSIS — R9431 Abnormal electrocardiogram [ECG] [EKG]: Secondary | ICD-10-CM | POA: Diagnosis not present

## 2020-04-13 DIAGNOSIS — Z9981 Dependence on supplemental oxygen: Secondary | ICD-10-CM | POA: Diagnosis not present

## 2020-04-13 DIAGNOSIS — I4891 Unspecified atrial fibrillation: Secondary | ICD-10-CM | POA: Diagnosis not present

## 2020-04-13 DIAGNOSIS — E876 Hypokalemia: Secondary | ICD-10-CM | POA: Diagnosis not present

## 2020-04-13 DIAGNOSIS — R6889 Other general symptoms and signs: Secondary | ICD-10-CM | POA: Diagnosis not present

## 2020-04-13 DIAGNOSIS — D649 Anemia, unspecified: Secondary | ICD-10-CM | POA: Diagnosis present

## 2020-04-13 DIAGNOSIS — B957 Other staphylococcus as the cause of diseases classified elsewhere: Secondary | ICD-10-CM | POA: Diagnosis present

## 2020-04-13 DIAGNOSIS — M25551 Pain in right hip: Secondary | ICD-10-CM

## 2020-04-13 DIAGNOSIS — Z86718 Personal history of other venous thrombosis and embolism: Secondary | ICD-10-CM

## 2020-04-13 DIAGNOSIS — F1120 Opioid dependence, uncomplicated: Secondary | ICD-10-CM | POA: Diagnosis present

## 2020-04-13 DIAGNOSIS — Z043 Encounter for examination and observation following other accident: Secondary | ICD-10-CM | POA: Diagnosis not present

## 2020-04-13 DIAGNOSIS — E860 Dehydration: Secondary | ICD-10-CM | POA: Diagnosis present

## 2020-04-13 DIAGNOSIS — F32A Depression, unspecified: Secondary | ICD-10-CM | POA: Diagnosis present

## 2020-04-13 DIAGNOSIS — R404 Transient alteration of awareness: Secondary | ICD-10-CM | POA: Diagnosis not present

## 2020-04-13 DIAGNOSIS — J9611 Chronic respiratory failure with hypoxia: Secondary | ICD-10-CM | POA: Diagnosis not present

## 2020-04-13 DIAGNOSIS — W19XXXA Unspecified fall, initial encounter: Secondary | ICD-10-CM

## 2020-04-13 DIAGNOSIS — C3431 Malignant neoplasm of lower lobe, right bronchus or lung: Secondary | ICD-10-CM | POA: Diagnosis not present

## 2020-04-13 DIAGNOSIS — Z95828 Presence of other vascular implants and grafts: Secondary | ICD-10-CM

## 2020-04-13 DIAGNOSIS — K219 Gastro-esophageal reflux disease without esophagitis: Secondary | ICD-10-CM | POA: Diagnosis present

## 2020-04-13 DIAGNOSIS — R400 Somnolence: Secondary | ICD-10-CM | POA: Diagnosis not present

## 2020-04-13 DIAGNOSIS — R627 Adult failure to thrive: Secondary | ICD-10-CM | POA: Diagnosis present

## 2020-04-13 DIAGNOSIS — F419 Anxiety disorder, unspecified: Secondary | ICD-10-CM | POA: Diagnosis present

## 2020-04-13 DIAGNOSIS — Z6825 Body mass index (BMI) 25.0-25.9, adult: Secondary | ICD-10-CM

## 2020-04-13 DIAGNOSIS — Z87891 Personal history of nicotine dependence: Secondary | ICD-10-CM

## 2020-04-13 DIAGNOSIS — J1282 Pneumonia due to coronavirus disease 2019: Secondary | ICD-10-CM | POA: Diagnosis present

## 2020-04-13 DIAGNOSIS — E870 Hyperosmolality and hypernatremia: Secondary | ICD-10-CM | POA: Diagnosis not present

## 2020-04-13 DIAGNOSIS — R4182 Altered mental status, unspecified: Secondary | ICD-10-CM | POA: Diagnosis not present

## 2020-04-13 DIAGNOSIS — Z515 Encounter for palliative care: Secondary | ICD-10-CM | POA: Diagnosis not present

## 2020-04-13 DIAGNOSIS — J440 Chronic obstructive pulmonary disease with acute lower respiratory infection: Secondary | ICD-10-CM | POA: Diagnosis present

## 2020-04-13 DIAGNOSIS — C3491 Malignant neoplasm of unspecified part of right bronchus or lung: Secondary | ICD-10-CM | POA: Diagnosis present

## 2020-04-13 DIAGNOSIS — I10 Essential (primary) hypertension: Secondary | ICD-10-CM | POA: Diagnosis present

## 2020-04-13 DIAGNOSIS — I499 Cardiac arrhythmia, unspecified: Secondary | ICD-10-CM | POA: Diagnosis not present

## 2020-04-13 DIAGNOSIS — R5381 Other malaise: Secondary | ICD-10-CM | POA: Diagnosis present

## 2020-04-13 DIAGNOSIS — Z86711 Personal history of pulmonary embolism: Secondary | ICD-10-CM | POA: Diagnosis not present

## 2020-04-13 DIAGNOSIS — Z885 Allergy status to narcotic agent status: Secondary | ICD-10-CM

## 2020-04-13 DIAGNOSIS — E785 Hyperlipidemia, unspecified: Secondary | ICD-10-CM | POA: Diagnosis present

## 2020-04-13 DIAGNOSIS — R7881 Bacteremia: Secondary | ICD-10-CM | POA: Diagnosis not present

## 2020-04-13 DIAGNOSIS — Z8249 Family history of ischemic heart disease and other diseases of the circulatory system: Secondary | ICD-10-CM

## 2020-04-13 DIAGNOSIS — Z66 Do not resuscitate: Secondary | ICD-10-CM | POA: Diagnosis not present

## 2020-04-13 DIAGNOSIS — Z781 Physical restraint status: Secondary | ICD-10-CM

## 2020-04-13 DIAGNOSIS — Z7901 Long term (current) use of anticoagulants: Secondary | ICD-10-CM

## 2020-04-13 DIAGNOSIS — Z882 Allergy status to sulfonamides status: Secondary | ICD-10-CM

## 2020-04-13 DIAGNOSIS — G9341 Metabolic encephalopathy: Secondary | ICD-10-CM | POA: Diagnosis not present

## 2020-04-13 DIAGNOSIS — Z880 Allergy status to penicillin: Secondary | ICD-10-CM

## 2020-04-13 DIAGNOSIS — Y842 Radiological procedure and radiotherapy as the cause of abnormal reaction of the patient, or of later complication, without mention of misadventure at the time of the procedure: Secondary | ICD-10-CM | POA: Diagnosis present

## 2020-04-13 DIAGNOSIS — M25571 Pain in right ankle and joints of right foot: Secondary | ICD-10-CM

## 2020-04-13 DIAGNOSIS — J9621 Acute and chronic respiratory failure with hypoxia: Secondary | ICD-10-CM | POA: Diagnosis not present

## 2020-04-13 DIAGNOSIS — G894 Chronic pain syndrome: Secondary | ICD-10-CM | POA: Diagnosis present

## 2020-04-13 DIAGNOSIS — Z743 Need for continuous supervision: Secondary | ICD-10-CM | POA: Diagnosis not present

## 2020-04-13 DIAGNOSIS — D8481 Immunodeficiency due to conditions classified elsewhere: Secondary | ICD-10-CM | POA: Diagnosis present

## 2020-04-13 DIAGNOSIS — J8410 Pulmonary fibrosis, unspecified: Secondary | ICD-10-CM | POA: Diagnosis not present

## 2020-04-13 LAB — COMPREHENSIVE METABOLIC PANEL
ALT: 13 U/L (ref 0–44)
AST: 34 U/L (ref 15–41)
Albumin: 2.6 g/dL — ABNORMAL LOW (ref 3.5–5.0)
Alkaline Phosphatase: 90 U/L (ref 38–126)
Anion gap: 11 (ref 5–15)
BUN: 10 mg/dL (ref 8–23)
CO2: 22 mmol/L (ref 22–32)
Calcium: 9.5 mg/dL (ref 8.9–10.3)
Chloride: 104 mmol/L (ref 98–111)
Creatinine, Ser: 1.13 mg/dL — ABNORMAL HIGH (ref 0.44–1.00)
GFR, Estimated: 50 mL/min — ABNORMAL LOW (ref >60)
Glucose, Bld: 127 mg/dL — ABNORMAL HIGH (ref 70–99)
Potassium: 3.8 mmol/L (ref 3.5–5.1)
Sodium: 137 mmol/L (ref 135–145)
Total Bilirubin: 0.5 mg/dL (ref 0.3–1.2)
Total Protein: 8.8 g/dL — ABNORMAL HIGH (ref 6.5–8.1)

## 2020-04-13 LAB — CBC WITH DIFFERENTIAL/PLATELET
Abs Immature Granulocytes: 0.04 10*3/uL (ref 0.00–0.07)
Basophils Absolute: 0 10*3/uL (ref 0.0–0.1)
Basophils Relative: 0 %
Eosinophils Absolute: 0.2 10*3/uL (ref 0.0–0.5)
Eosinophils Relative: 2 %
HCT: 29.1 % — ABNORMAL LOW (ref 36.0–46.0)
Hemoglobin: 8.6 g/dL — ABNORMAL LOW (ref 12.0–15.0)
Immature Granulocytes: 0 %
Lymphocytes Relative: 12 %
Lymphs Abs: 1.2 10*3/uL (ref 0.7–4.0)
MCH: 26.1 pg (ref 26.0–34.0)
MCHC: 29.6 g/dL — ABNORMAL LOW (ref 30.0–36.0)
MCV: 88.2 fL (ref 80.0–100.0)
Monocytes Absolute: 1.2 10*3/uL — ABNORMAL HIGH (ref 0.1–1.0)
Monocytes Relative: 12 %
Neutro Abs: 7.8 10*3/uL — ABNORMAL HIGH (ref 1.7–7.7)
Neutrophils Relative %: 74 %
Platelets: 426 10*3/uL — ABNORMAL HIGH (ref 150–400)
RBC: 3.3 MIL/uL — ABNORMAL LOW (ref 3.87–5.11)
RDW: 18.5 % — ABNORMAL HIGH (ref 11.5–15.5)
WBC: 10.5 10*3/uL (ref 4.0–10.5)
nRBC: 0 % (ref 0.0–0.2)

## 2020-04-13 LAB — D-DIMER, QUANTITATIVE: D-Dimer, Quant: 3.28 ug/mL-FEU — ABNORMAL HIGH (ref 0.00–0.50)

## 2020-04-13 LAB — PROCALCITONIN: Procalcitonin: <0.1 ng/mL

## 2020-04-13 LAB — TRIGLYCERIDES: Triglycerides: 111 mg/dL (ref <150)

## 2020-04-13 LAB — C-REACTIVE PROTEIN: CRP: 12.6 mg/dL — ABNORMAL HIGH (ref <1.0)

## 2020-04-13 LAB — FERRITIN: Ferritin: 32 ng/mL (ref 11–307)

## 2020-04-13 LAB — LACTATE DEHYDROGENASE: LDH: 201 U/L — ABNORMAL HIGH (ref 98–192)

## 2020-04-13 LAB — FIBRINOGEN: Fibrinogen: 711 mg/dL — ABNORMAL HIGH (ref 210–475)

## 2020-04-13 MED ORDER — SODIUM CHLORIDE 0.9 % IV SOLN
100.0000 mg | Freq: Every day | INTRAVENOUS | Status: AC
  Administered 2020-04-14 – 2020-04-17 (×4): 100 mg via INTRAVENOUS
  Filled 2020-04-13 (×4): qty 20

## 2020-04-13 MED ORDER — HALOPERIDOL LACTATE 5 MG/ML IJ SOLN
2.5000 mg | Freq: Once | INTRAMUSCULAR | Status: AC
  Administered 2020-04-13: 2.5 mg via INTRAMUSCULAR

## 2020-04-13 MED ORDER — ONDANSETRON HCL 4 MG PO TABS: 4.0000 mg | ORAL_TABLET | Freq: Four times a day (QID) | ORAL | Status: DC | PRN

## 2020-04-13 MED ORDER — GUAIFENESIN-DM 100-10 MG/5ML PO SYRP: 10.0000 mL | ORAL_SOLUTION | ORAL | Status: DC | PRN

## 2020-04-13 MED ORDER — ZINC SULFATE 220 (50 ZN) MG PO CAPS
220.0000 mg | ORAL_CAPSULE | Freq: Every day | ORAL | Status: DC
  Administered 2020-04-14 – 2020-04-19 (×4): 220 mg via ORAL
  Filled 2020-04-13 (×6): qty 1

## 2020-04-13 MED ORDER — HALOPERIDOL LACTATE 5 MG/ML IJ SOLN
INTRAMUSCULAR | Status: AC
  Administered 2020-04-13: 2.5 mg via INTRAMUSCULAR
  Filled 2020-04-13: qty 1

## 2020-04-13 MED ORDER — MIRTAZAPINE 15 MG PO TABS: 7.5000 mg | ORAL_TABLET | Freq: Every day | ORAL | Status: DC

## 2020-04-13 MED ORDER — ALBUTEROL SULFATE HFA 108 (90 BASE) MCG/ACT IN AERS
2.0000 | INHALATION_SPRAY | Freq: Four times a day (QID) | RESPIRATORY_TRACT | Status: DC
  Administered 2020-04-14: 2 via RESPIRATORY_TRACT
  Filled 2020-04-13: qty 6.7

## 2020-04-13 MED ORDER — HALOPERIDOL LACTATE 5 MG/ML IJ SOLN: 2.5000 mg | Freq: Once | INTRAMUSCULAR | Status: AC

## 2020-04-13 MED ORDER — APIXABAN 2.5 MG PO TABS
2.5000 mg | ORAL_TABLET | Freq: Two times a day (BID) | ORAL | Status: DC
  Administered 2020-04-14 – 2020-04-19 (×7): 2.5 mg via ORAL
  Filled 2020-04-13 (×12): qty 1

## 2020-04-13 MED ORDER — SODIUM CHLORIDE 0.9 % IV SOLN
200.0000 mg | Freq: Once | INTRAVENOUS | Status: AC
  Administered 2020-04-14: 200 mg via INTRAVENOUS
  Filled 2020-04-13: qty 40

## 2020-04-13 MED ORDER — LACTATED RINGERS IV SOLN: INTRAVENOUS | Status: AC

## 2020-04-13 MED ORDER — ONDANSETRON HCL 4 MG/2ML IJ SOLN: 4.0000 mg | Freq: Four times a day (QID) | INTRAMUSCULAR | Status: DC | PRN

## 2020-04-13 MED ORDER — PANTOPRAZOLE SODIUM 40 MG IV SOLR
40.0000 mg | Freq: Two times a day (BID) | INTRAVENOUS | Status: DC
  Administered 2020-04-14 – 2020-04-19 (×10): 40 mg via INTRAVENOUS
  Filled 2020-04-13 (×11): qty 40

## 2020-04-13 MED ORDER — PANTOPRAZOLE SODIUM 40 MG PO TBEC: 40.0000 mg | DELAYED_RELEASE_TABLET | Freq: Every day | ORAL | Status: DC

## 2020-04-13 MED ORDER — ACETAMINOPHEN 325 MG PO TABS
650.0000 mg | ORAL_TABLET | Freq: Four times a day (QID) | ORAL | Status: DC | PRN
  Filled 2020-04-13: qty 2

## 2020-04-13 MED ORDER — AMLODIPINE BESYLATE 5 MG PO TABS
5.0000 mg | ORAL_TABLET | Freq: Every day | ORAL | Status: DC
  Administered 2020-04-14 – 2020-04-18 (×3): 5 mg via ORAL
  Filled 2020-04-13 (×5): qty 1

## 2020-04-13 MED ORDER — ASCORBIC ACID 500 MG PO TABS
500.0000 mg | ORAL_TABLET | Freq: Every day | ORAL | Status: DC
  Administered 2020-04-17 – 2020-04-19 (×3): 500 mg via ORAL
  Filled 2020-04-13 (×5): qty 1

## 2020-04-13 MED ORDER — PRAVASTATIN SODIUM 10 MG PO TABS
10.0000 mg | ORAL_TABLET | Freq: Every day | ORAL | Status: DC
  Administered 2020-04-17 – 2020-04-18 (×2): 10 mg via ORAL
  Filled 2020-04-13 (×4): qty 1

## 2020-04-13 MED ORDER — POLYETHYLENE GLYCOL 3350 17 G PO PACK: 17.0000 g | PACK | Freq: Every day | ORAL | Status: DC | PRN

## 2020-04-13 MED ORDER — ALBUTEROL SULFATE HFA 108 (90 BASE) MCG/ACT IN AERS
2.0000 | INHALATION_SPRAY | RESPIRATORY_TRACT | Status: DC | PRN
  Administered 2020-04-14: 2 via RESPIRATORY_TRACT
  Filled 2020-04-13: qty 6.7

## 2020-04-13 MED ORDER — ALPRAZOLAM 0.25 MG PO TABS: 0.5000 mg | ORAL_TABLET | Freq: Two times a day (BID) | ORAL | Status: DC | PRN

## 2020-04-13 MED ORDER — DEXAMETHASONE SODIUM PHOSPHATE 10 MG/ML IJ SOLN
6.0000 mg | INTRAMUSCULAR | Status: DC
  Administered 2020-04-14 – 2020-04-19 (×7): 6 mg via INTRAVENOUS
  Filled 2020-04-13 (×7): qty 1

## 2020-04-13 MED ORDER — LACTATED RINGERS IV BOLUS
500.0000 mL | Freq: Once | INTRAVENOUS | Status: AC
  Administered 2020-04-14: 500 mL via INTRAVENOUS

## 2020-04-13 NOTE — ED Notes (Signed)
Pt daughter called [name] for an update please call [phone]

## 2020-04-13 NOTE — H&P (Signed)
History and Physical    [name] YQM:[id number] DOB: 05/29/1940 DOA: 04/13/2020  PCP: [name], FNP  Patient coming from: home, brought in by EMS   Chief Complaint:  Chief Complaint  Patient presents with  . 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  Of note, patient was recently diagnosed with COVID-19 on 11/16.  At that time, patient received a monoclonal antibody infusion in our emergency department.  Since patient's diagnosis with COVID-19 on 11/16, family reports that she has developed progressively worsening lethargy and confusion.  According to family, patient has no history of dementia.  Since her diagnosis, patient is becoming more more disoriented with poor oral intake generalized weakness and lethargy.  Family also reports associated cough but denies any fevers, nausea, vomiting or diarrhea.  Patient symptoms have progressively worsened over the past several days.  Earlier in the day on 11/18, the patient was standing on her porch when she suddenly collapsed and fell to the ground.  The daughter witnessed this fall and states that she struck the side of her head/ear on a nearby rocking chair.  Patient at that point was brought into [facility] emergency department for evaluation via EMS.  Upon evaluation in the emergency department trauma survey ended up being negative with no evidence of intracranial hemorrhage on CT imaging of the head.  However, patient continued to be quite lethargic in the emergency department thought to be secondary to encephalopathy due to progressive COVID-19 infection.  The hospitalist  group was then called to assess the patient for admission to the hospital.  Review of Systems:   Review of Systems  Unable to perform ROS: Mental status change    History reviewed. No pertinent past medical history.  History reviewed. No pertinent surgical history.   reports that she quit smoking about 31 years ago. She has never used smokeless tobacco. No history on file for alcohol use and drug use.  Allergies  Allergen Reactions  . Ampicillin Itching and Nausea And Vomiting  . Sulfa Antibiotics Itching and Nausea And Vomiting  . Toradol [Ketorolac Tromethamine] Itching and Nausea And Vomiting  . Tramadol Itching and Nausea And Vomiting    Family History  Problem Relation Age of Onset  . Heart disease Mother   . Heart disease Father      Prior to Admission medications   Not on File    Physical Exam: Vitals:   04/13/20 2115 04/13/20 2130 04/13/20 2200 04/13/20 2230  BP: 114/81 114/70 115/81 103/78  Pulse: (!) 115 (!) 108 (!) 113 (!) 106  Resp: (!) 21 17 (!) 23 11  Temp:      TempSrc:      SpO2: 98% 96% 96% 94%  Weight:      Height:        Constitutional: Lethargic arousable, oriented x1, patient is in mild respiratory distress. Skin: no rashes, no lesions, extremely poor skin turgor noted. Eyes: Pupils are equally reactive to light.  No evidence of scleral icterus or conjunctival pallor.  ENMT: Dry mucous membranes noted.  Posterior pharynx clear of any exudate or lesions.   Neck: normal, supple, no masses, no thyromegaly.  No evidence of jugular venous distension.   Respiratory: Coarse breath sounds bilaterally with diffuse rhonchi and rales.  Minimal intermittent expiratory wheezing noted.  Notable increased respiratory effort without any evidence of accessory muscle use.  Cardiovascular: Tachycardic rate with regular rhythm, no murmurs / rubs / gallops. No extremity edema. 2+ pedal pulses. No carotid bruits.  Chest:   Nontender without crepitus or deformity.    Back:   Nontender without crepitus or deformity. Abdomen: Abdomen is soft and nontender.  No evidence of intra-abdominal masses.  Positive bowel sounds noted in all quadrants.   Musculoskeletal: No joint deformity upper and lower extremities. Good ROM, no contractures. Normal muscle tone.  Neurologic: Patient is extremely lethargic but arousable.  Patient is not consistently following commands.  Patient is spontaneously moving all 4 extremities however.  Patient is responsive to verbal and painful stimuli.  Patient does localize to pain.   Psychiatric: Unable to assess due to substantial lethargy.  Patient currently does not seem to possess insight as to her current situation.   Labs on Admission: I have personally reviewed following labs and imaging studies -   CBC: Recent Labs  Lab 04/13/20 1855  WBC 10.5  NEUTROABS 7.8*  HGB 8.6*  HCT 29.1*  MCV 88.2  PLT 244*   Basic Metabolic Panel: Recent Labs  Lab 04/13/20 1855  NA 137  K 3.8  CL 104  CO2 22  GLUCOSE 127*  BUN 10  CREATININE 1.13*  CALCIUM 9.5   GFR: Estimated Creatinine Clearance: 38.9 mL/min (A) (by C-G formula based on SCr of 1.13 mg/dL (H)). Liver Function Tests: Recent Labs  Lab 04/13/20 1855  AST 34  ALT 13  ALKPHOS 90  BILITOT 0.5  PROT 8.8*  ALBUMIN 2.6*   No results for input(s): LIPASE, AMYLASE in the last 168 hours. No results for input(s): AMMONIA in the last 168 hours. Coagulation Profile: No results for input(s): INR, PROTIME in the last 168 hours. Cardiac Enzymes: No results for input(s): CKTOTAL, CKMB, CKMBINDEX, TROPONINI in the last 168 hours. BNP (last 3 results) No results for input(s): PROBNP in the last 8760 hours. HbA1C: No results for input(s): HGBA1C in the last 72 hours. CBG: No results for input(s): GLUCAP in the last 168 hours. Lipid Profile: Recent Labs    04/13/20 1855  TRIG 111   Thyroid Function Tests: No results for input(s): TSH, T4TOTAL, FREET4, T3FREE,  THYROIDAB in the last 72 hours. Anemia Panel: Recent Labs    04/13/20 1855  FERRITIN 32   Urine analysis: No results found for: COLORURINE, APPEARANCEUR, LABSPEC, PHURINE, GLUCOSEU, HGBUR, BILIRUBINUR, KETONESUR, PROTEINUR, UROBILINOGEN, NITRITE, LEUKOCYTESUR  Radiological Exams on Admission - Personally Reviewed: CT Head Wo Contrast  Result Date: 04/13/2020 CLINICAL DATA:  79 year old female with head trauma. EXAM: CT HEAD WITHOUT CONTRAST CT CERVICAL SPINE WITHOUT CONTRAST TECHNIQUE: Multidetector CT imaging of the head and cervical spine was performed following the standard protocol without intravenous contrast. Multiplanar CT image reconstructions of the cervical spine were also generated. COMPARISON:  CT dated 08/28/2019. FINDINGS: CT HEAD FINDINGS Brain: Mild age-related atrophy and chronic microvascular ischemic changes. There is no acute intracranial hemorrhage. No mass effect or midline shift. No extra-axial fluid collection. Vascular: No hyperdense vessel or unexpected calcification. Skull: Normal. Negative for fracture or focal lesion. Sinuses/Orbits: No acute finding. Other: None CT CERVICAL SPINE FINDINGS Alignment: No acute subluxation. Skull base and vertebrae: No acute fracture. Osteopenia. Soft tissues and spinal canal: No prevertebral fluid or swelling. No visible canal  hematoma. Disc levels:  Degenerative changes. No acute findings. Upper chest: Small left upper lobe calcified granuloma. Partially visualized right subclavian central venous line. Other: Bilateral carotid bulb calcified plaques. IMPRESSION: 1. No acute intracranial pathology. Mild age-related atrophy and chronic microvascular ischemic changes. 2. No acute/traumatic cervical spine pathology. Electronically Signed   By: Anner Crete M.D.   On: 04/13/2020 20:44   CT Cervical Spine Wo Contrast  Result Date: 04/13/2020 CLINICAL DATA:  79 year old female with head trauma. EXAM: CT HEAD WITHOUT CONTRAST CT CERVICAL  SPINE WITHOUT CONTRAST TECHNIQUE: Multidetector CT imaging of the head and cervical spine was performed following the standard protocol without intravenous contrast. Multiplanar CT image reconstructions of the cervical spine were also generated. COMPARISON:  CT dated 08/28/2019. FINDINGS: CT HEAD FINDINGS Brain: Mild age-related atrophy and chronic microvascular ischemic changes. There is no acute intracranial hemorrhage. No mass effect or midline shift. No extra-axial fluid collection. Vascular: No hyperdense vessel or unexpected calcification. Skull: Normal. Negative for fracture or focal lesion. Sinuses/Orbits: No acute finding. Other: None CT CERVICAL SPINE FINDINGS Alignment: No acute subluxation. Skull base and vertebrae: No acute fracture. Osteopenia. Soft tissues and spinal canal: No prevertebral fluid or swelling. No visible canal hematoma. Disc levels:  Degenerative changes. No acute findings. Upper chest: Small left upper lobe calcified granuloma. Partially visualized right subclavian central venous line. Other: Bilateral carotid bulb calcified plaques. IMPRESSION: 1. No acute intracranial pathology. Mild age-related atrophy and chronic microvascular ischemic changes. 2. No acute/traumatic cervical spine pathology. Electronically Signed   By: Anner Crete M.D.   On: 04/13/2020 20:44   DG Pelvis Portable  Result Date: 04/13/2020 CLINICAL DATA:  Fall EXAM: PORTABLE PELVIS 1-2 VIEWS COMPARISON:  None. FINDINGS: Prior right hip replacement. Normal AP alignment. Mild degenerative changes within the left hip. SI joints symmetric and unremarkable. No acute bony abnormality. Specifically, no fracture, subluxation, or dislocation. IMPRESSION: No acute bony abnormality. Electronically Signed   By: Rolm Baptise M.D.   On: 04/13/2020 19:21   DG Chest Port 1 View  Result Date: 04/13/2020 CLINICAL DATA:  Fall, COVID EXAM: PORTABLE CHEST 1 VIEW COMPARISON:  04/11/2020 FINDINGS: Right Port-A-Cath remains in  place, unchanged. Heart is normal size. Low lung volumes. Diffuse interstitial prominence throughout the lungs compatible with chronic lung disease/fibrosis. No definite acute confluent airspace opacity. Previously seen rounded mass posteriorly in the right lung better seen on prior CT. No effusions. IMPRESSION: Stable appearance of the chest. Electronically Signed   By: Rolm Baptise M.D.   On: 04/13/2020 19:21    EKG: Personally reviewed.  Rhythm is sinus tachycardia with heart rate of 112 bpm.  No dynamic ST segment changes appreciated.  Assessment/Plan Principal Problem:   Acute metabolic encephalopathy   Patient presenting with several days of progressively worsening lethargy and confusion as reported by family  Patient is exhibiting substantial symptoms here in the emergency department  Most likely etiology is encephalopathy secondary to underlying COVID-19 infection with concurrent dehydration exacerbating patient's mentation  Hydrating patient with intravenous isotonic fluids.  Treating underlying COVID-19 infection with intravenous remdesivir and dexamethasone.  Obtaining vitamin B12, folate, VBG to assess for any evidence of hypercapnic respiratory failure.  Obtaining urinalysis.  CT head performed in the emergency department has been found to be unremarkable.  Monitoring for clinical improvement as we treat underlying dehydration and COVID-19 infection.  Active Problems:   COVID-19 virus infection  Diagnosed on 11/16 at a [place] facility  Patient received medical antibody infusion on 11/16  Due to progressively worsening weakness, confusion, cough and encephalopathy with oxygen saturations of 89 to 90% on arrival patient will be hospitalized and treated for COVID-19 as well  Initiating intravenous dexamethasone and remdesivir  Providing patient with scheduled bronchodilator therapy for suspected concurrent COPD this patient  Admitting patient to COVID-19  unit  Providing patient with submental oxygen for bouts of hypoxia    COPD with acute exacerbation ([place])   On examination, patient exhibiting coarse breath sounds with intermittent expiratory wheezing concerning for concurrent COPD exacerbation  Patient already receiving systemic steroids for COVID-19 infection  Additionally providing patient with scheduled bronchodilator therapy every 6 hours  Anemia   Patient noted to have substantial anemia with hemoglobin of 8.6 on arrival, down from 11.1 on 11/16.  No clinical evidence of bleeding on examination  Patient is on Eliquis therapy which will be continued at this time until evidence of bleeding is established.  Obtaining type and screen  Cycling CBCs every 6 hours  If patient exhibits clinical evidence of gross bleeding or progressively worsening anemia will consider GI consultation in a.m.  Providing patient with Protonix 40 mg IV every 12 hours in the meantime  Patient last underwent EGD in 2020 while being hospitalized for suspected GI bleeding which did not reveal any source of bleeding.    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    Chronic respiratory failure with hypoxia (HCC)   According to my discussion with family, patient is a history of chronic respiratory failure and home use of oxygen, 2 L of oxygen with exertion as well as 2 L of oxygen nightly.  Provided patient with supplemental oxygen at this time as ordered by emergency department staff, patient was found to be saturating 89 to 90% on arrival although it is possible that this is the patient's baseline considering her known history of COPD and pulmonary fibrosis.  Will obtain VBG to assess for any evidence of superimposed hypercapnic  respiratory failure     Squamous cell carcinoma of lung, stage III, right [facility])   Patient follows with Dr. Tasia Catchings at [facility] oncology  Patient actively receiving radiation therapy without chemotherapy due to poor performance status  Continue outpatient follow-up    GERD (gastroesophageal reflux disease)     Fall at home, initial encounter     Code Status:  Full code Family Communication: Case has been discussed with patient's daughter via phone conversation who has been updated on plan of care  Status is: Observation  The patient remains OBS appropriate and will d/c before 2 midnights.  Dispo: The patient is from: Home              Anticipated d/c is to: Home              Anticipated d/c date is: 2 days              Patient currently is not medically stable to d/c.        Vernelle Emerald MD [facility] Pager [phone]  If 7PM-7AM, please contact night-coverage [email or web address] Use universal [place] password for that web site. If you do not have the password, please call the hospital operator.  04/13/2020, 10:41 PM

## 2020-04-13 NOTE — ED Notes (Signed)
Sent message thru amion to Dr. Cyd Silence at this time requesting for meds.

## 2020-04-13 NOTE — Telephone Encounter (Signed)
Noted, appreciate follow-up.

## 2020-04-13 NOTE — Progress Notes (Signed)
   04/13/20 1813  Clinical Encounter Type  Visited With Patient  Visit Type Trauma  Referral From Nurse  Consult/Referral To Chaplain   Chaplain responded to Level 2 trauma. No present need for chaplain. Chaplain let Pt's nurse know that chaplain remains available as needed.  This note was prepared by Chaplain Resident, Dante Gang, MDiv. Chaplain remains available as needed through the on-call pager: [phone].

## 2020-04-13 NOTE — Telephone Encounter (Signed)
Noted. Please check in with patient as do not see report of her arriving at the ER.

## 2020-04-13 NOTE — ED Notes (Signed)
Found pt standing on the side of the bed, took out her IV, gown off. Pt yelling "help! They are kidnapping me!"  Pt difficult to redirect. Several staff currently at bedside trying to calm pt down. Dr. Cyd Silence made aware, awaiting for further orders.

## 2020-04-13 NOTE — ED Provider Notes (Signed)
[facility] EMERGENCY DEPARTMENT Provider Note   CSN: [id number] Arrival date & time: 04/13/20  1838     History Chief Complaint  Patient presents with  . Fall    [name] is a 79 y.o. female.  Presents to ER as a level 2 trauma.  History limited due to acuity, altered mental status.  Per report, patient diagnosed with COVID-19 a couple days ago, over the past couple days she has been increasingly disoriented, had fall and hit her head.  On blood thinners.  Per review of patient's other chart, COPD, lung cancer and radiation, pulmonary embolism on Eliquis.  Recent ER visit, diagnosed with Covid.  HPI     No past medical history on file.  There are no problems to display for this patient.    OB History   No obstetric history on file.     No family history on file.  Social History   Tobacco Use  . Smoking status: Not on file  Substance Use Topics  . Alcohol use: Not on file  . Drug use: Not on file    Home Medications Prior to Admission medications   Not on File    Allergies    Patient has no allergy information on record.  Review of Systems   Review of Systems  Unable to perform ROS: Mental status change    Physical Exam Updated Vital Signs BP 108/65 (BP Location: Right Arm)   Pulse (!) 105   Temp 97.7 F (36.5 C) (Oral)   Resp 18   Ht 5\' 4"  (1.626 m)   Wt 68 kg   SpO2 98%   BMI 25.75 kg/m   Physical Exam Vitals and nursing note reviewed.  Constitutional:      General: She is not in acute distress.    Appearance: She is well-developed.  HENT:     Head: Normocephalic and atraumatic.  Eyes:     Conjunctiva/sclera: Conjunctivae normal.  Cardiovascular:     Rate and Rhythm: Normal rate and regular rhythm.     Heart sounds: No murmur heard.   Pulmonary:     Effort: Pulmonary effort is normal.     Comments: Diminished at bases, mild tachypnea Abdominal:     Palpations: Abdomen is soft.     Tenderness: There is  no abdominal tenderness.  Musculoskeletal:     Cervical back: Neck supple.     Comments: Back: no C, T, L spine TTP, no step off or deformity RUE: no TTP throughout, no deformity, normal joint ROM, radial pulse intact, distal sensation and motor intact LUE: no TTP throughout, no deformity, normal joint ROM, radial pulse intact, distal sensation and motor intact RLE:  no TTP throughout, no deformity, normal joint ROM, distal pulse, sensation and motor intact LLE: no TTP throughout, no deformity, normal joint ROM, distal pulse, sensation and motor intact  Skin:    General: Skin is warm and dry.  Neurological:     General: No focal deficit present.     Mental Status: She is alert.     Comments: Alert, oriented to person but not time or place     ED Results / Procedures / Treatments   Labs (all labs ordered are listed, but only abnormal results are displayed) Labs Reviewed  CBC WITH DIFFERENTIAL/PLATELET - Abnormal; Notable for the following components:      Result Value   RBC 3.30 (*)    Hemoglobin 8.6 (*)    HCT 29.1 (*)  MCHC 29.6 (*)    RDW 18.5 (*)    Platelets 426 (*)    Neutro Abs 7.8 (*)    Monocytes Absolute 1.2 (*)    All other components within normal limits  COMPREHENSIVE METABOLIC PANEL - Abnormal; Notable for the following components:   Glucose, Bld 127 (*)    Creatinine, Ser 1.13 (*)    Total Protein 8.8 (*)    Albumin 2.6 (*)    GFR, Estimated 50 (*)    All other components within normal limits  D-DIMER, QUANTITATIVE (NOT AT [facility]) - Abnormal; Notable for the following components:   D-Dimer, Quant 3.28 (*)    All other components within normal limits  LACTATE DEHYDROGENASE - Abnormal; Notable for the following components:   LDH 201 (*)    All other components within normal limits  FIBRINOGEN - Abnormal; Notable for the following components:   Fibrinogen 711 (*)    All other components within normal limits  C-REACTIVE PROTEIN - Abnormal; Notable for the  following components:   CRP 12.6 (*)    All other components within normal limits  CULTURE, BLOOD (ROUTINE X 2)  CULTURE, BLOOD (ROUTINE X 2)  PROCALCITONIN  FERRITIN  TRIGLYCERIDES    EKG None  Radiology CT Head Wo Contrast  Result Date: 04/13/2020 CLINICAL DATA:  79 year old female with head trauma. EXAM: CT HEAD WITHOUT CONTRAST CT CERVICAL SPINE WITHOUT CONTRAST TECHNIQUE: Multidetector CT imaging of the head and cervical spine was performed following the standard protocol without intravenous contrast. Multiplanar CT image reconstructions of the cervical spine were also generated. COMPARISON:  CT dated 08/28/2019. FINDINGS: CT HEAD FINDINGS Brain: Mild age-related atrophy and chronic microvascular ischemic changes. There is no acute intracranial hemorrhage. No mass effect or midline shift. No extra-axial fluid collection. Vascular: No hyperdense vessel or unexpected calcification. Skull: Normal. Negative for fracture or focal lesion. Sinuses/Orbits: No acute finding. Other: None CT CERVICAL SPINE FINDINGS Alignment: No acute subluxation. Skull base and vertebrae: No acute fracture. Osteopenia. Soft tissues and spinal canal: No prevertebral fluid or swelling. No visible canal hematoma. Disc levels:  Degenerative changes. No acute findings. Upper chest: Small left upper lobe calcified granuloma. Partially visualized right subclavian central venous line. Other: Bilateral carotid bulb calcified plaques. IMPRESSION: 1. No acute intracranial pathology. Mild age-related atrophy and chronic microvascular ischemic changes. 2. No acute/traumatic cervical spine pathology. Electronically Signed   By: Anner Crete M.D.   On: 04/13/2020 20:44   CT Cervical Spine Wo Contrast  Result Date: 04/13/2020 CLINICAL DATA:  79 year old female with head trauma. EXAM: CT HEAD WITHOUT CONTRAST CT CERVICAL SPINE WITHOUT CONTRAST TECHNIQUE: Multidetector CT imaging of the head and cervical spine was performed  following the standard protocol without intravenous contrast. Multiplanar CT image reconstructions of the cervical spine were also generated. COMPARISON:  CT dated 08/28/2019. FINDINGS: CT HEAD FINDINGS Brain: Mild age-related atrophy and chronic microvascular ischemic changes. There is no acute intracranial hemorrhage. No mass effect or midline shift. No extra-axial fluid collection. Vascular: No hyperdense vessel or unexpected calcification. Skull: Normal. Negative for fracture or focal lesion. Sinuses/Orbits: No acute finding. Other: None CT CERVICAL SPINE FINDINGS Alignment: No acute subluxation. Skull base and vertebrae: No acute fracture. Osteopenia. Soft tissues and spinal canal: No prevertebral fluid or swelling. No visible canal hematoma. Disc levels:  Degenerative changes. No acute findings. Upper chest: Small left upper lobe calcified granuloma. Partially visualized right subclavian central venous line. Other: Bilateral carotid bulb calcified plaques. IMPRESSION: 1. No acute intracranial pathology.  Mild age-related atrophy and chronic microvascular ischemic changes. 2. No acute/traumatic cervical spine pathology. Electronically Signed   By: Anner Crete M.D.   On: 04/13/2020 20:44   DG Pelvis Portable  Result Date: 04/13/2020 CLINICAL DATA:  Fall EXAM: PORTABLE PELVIS 1-2 VIEWS COMPARISON:  None. FINDINGS: Prior right hip replacement. Normal AP alignment. Mild degenerative changes within the left hip. SI joints symmetric and unremarkable. No acute bony abnormality. Specifically, no fracture, subluxation, or dislocation. IMPRESSION: No acute bony abnormality. Electronically Signed   By: Rolm Baptise M.D.   On: 04/13/2020 19:21   DG Chest Port 1 View  Result Date: 04/13/2020 CLINICAL DATA:  Fall, COVID EXAM: PORTABLE CHEST 1 VIEW COMPARISON:  04/11/2020 FINDINGS: Right Port-A-Cath remains in place, unchanged. Heart is normal size. Low lung volumes. Diffuse interstitial prominence throughout  the lungs compatible with chronic lung disease/fibrosis. No definite acute confluent airspace opacity. Previously seen rounded mass posteriorly in the right lung better seen on prior CT. No effusions. IMPRESSION: Stable appearance of the chest. Electronically Signed   By: Rolm Baptise M.D.   On: 04/13/2020 19:21    Procedures Procedures (including critical care time)  Medications Ordered in ED Medications - No data to display  ED Course  I have reviewed the triage vital signs and the nursing notes.  Pertinent labs & imaging results that were available during my care of the patient were reviewed by me and considered in my medical decision making (see chart for details).    MDM Rules/Calculators/A&P                         79 year old lady lung CA, recent diagnosis for COVID-19 presents as a level 2 trauma due to fall on thinners.  Regarding trauma exam, no real traumatic pathology identified on exam.  CT head and C-spine negative, screening CXR and pelvis x-ray negative for traumatic pathology.  Patient does have increased confusion and borderline hypoxia.  Suspect related to COVID-19.  Started on supplemental nasal cannula, consult the hospitalist for admission.  Dr. Cyd Silence with [facility] will admit.  Final Clinical Impression(s) / ED Diagnoses Final diagnoses:  COVID  Altered mental status, unspecified altered mental status type    Rx / DC Orders ED Discharge Orders    None       Lucrezia Starch, MD 04/14/20 [phone]

## 2020-04-13 NOTE — Telephone Encounter (Signed)
I spoke with [name]; pt is doing a lot better today; pt is resting and drinking normally. Cathy asked her mom if she is going to ED and pt said she does not want to go to ED. [name] said she is not having CP and not urinating incontinently today. UC & ED precautions given and [name] voiced understanding. FYI to DR Einar Pheasant and [name] FNP.

## 2020-04-13 NOTE — Progress Notes (Signed)
Orthopedic Tech Progress Note Patient Details:  [name] 06-Jan-1941 [id number] Level 2 TRAUMA. Not needed Patient ID: GRACIOUS RENKEN, female   DOB: 10-16-1940, 79 y.o.   MRN: [id number]   Chip Boer 04/13/2020, 8:04 PM

## 2020-04-13 NOTE — ED Triage Notes (Signed)
Patient BIB [facility] for fall. Diagnosed with COVID 2 days ago, family states patient has become increasingly disoriented over the last few days, then fell outside and hit her head. Patient oriented to self with EMS. Patient oriented to self, place, time at this time.

## 2020-04-13 NOTE — ED Notes (Addendum)
Patient continuing to attempt to hit staff and scream out. Pt not redirectable at this time. Provider made aware. Pt refusing to keep oxygen on despite low 02 stats.

## 2020-04-14 ENCOUNTER — Ambulatory Visit: Payer: Medicare Other

## 2020-04-14 ENCOUNTER — Inpatient Hospital Stay: Payer: Medicare Other

## 2020-04-14 ENCOUNTER — Inpatient Hospital Stay (HOSPITAL_COMMUNITY): Payer: Medicare Other

## 2020-04-14 DIAGNOSIS — Z66 Do not resuscitate: Secondary | ICD-10-CM | POA: Diagnosis not present

## 2020-04-14 DIAGNOSIS — I700 Atherosclerosis of aorta: Secondary | ICD-10-CM | POA: Diagnosis not present

## 2020-04-14 DIAGNOSIS — W19XXXA Unspecified fall, initial encounter: Secondary | ICD-10-CM | POA: Diagnosis not present

## 2020-04-14 DIAGNOSIS — R5381 Other malaise: Secondary | ICD-10-CM | POA: Diagnosis present

## 2020-04-14 DIAGNOSIS — Z9981 Dependence on supplemental oxygen: Secondary | ICD-10-CM | POA: Diagnosis not present

## 2020-04-14 DIAGNOSIS — J701 Chronic and other pulmonary manifestations due to radiation: Secondary | ICD-10-CM | POA: Diagnosis present

## 2020-04-14 DIAGNOSIS — R7881 Bacteremia: Secondary | ICD-10-CM | POA: Diagnosis present

## 2020-04-14 DIAGNOSIS — M25551 Pain in right hip: Secondary | ICD-10-CM | POA: Diagnosis not present

## 2020-04-14 DIAGNOSIS — Y842 Radiological procedure and radiotherapy as the cause of abnormal reaction of the patient, or of later complication, without mention of misadventure at the time of the procedure: Secondary | ICD-10-CM | POA: Diagnosis present

## 2020-04-14 DIAGNOSIS — J440 Chronic obstructive pulmonary disease with acute lower respiratory infection: Secondary | ICD-10-CM | POA: Diagnosis present

## 2020-04-14 DIAGNOSIS — I2510 Atherosclerotic heart disease of native coronary artery without angina pectoris: Secondary | ICD-10-CM | POA: Diagnosis not present

## 2020-04-14 DIAGNOSIS — Z515 Encounter for palliative care: Secondary | ICD-10-CM | POA: Diagnosis not present

## 2020-04-14 DIAGNOSIS — M7989 Other specified soft tissue disorders: Secondary | ICD-10-CM | POA: Diagnosis not present

## 2020-04-14 DIAGNOSIS — F1120 Opioid dependence, uncomplicated: Secondary | ICD-10-CM | POA: Diagnosis present

## 2020-04-14 DIAGNOSIS — R4182 Altered mental status, unspecified: Secondary | ICD-10-CM | POA: Diagnosis not present

## 2020-04-14 DIAGNOSIS — Z7401 Bed confinement status: Secondary | ICD-10-CM | POA: Diagnosis not present

## 2020-04-14 DIAGNOSIS — R627 Adult failure to thrive: Secondary | ICD-10-CM | POA: Diagnosis not present

## 2020-04-14 DIAGNOSIS — Y92009 Unspecified place in unspecified non-institutional (private) residence as the place of occurrence of the external cause: Secondary | ICD-10-CM | POA: Diagnosis not present

## 2020-04-14 DIAGNOSIS — E876 Hypokalemia: Secondary | ICD-10-CM | POA: Diagnosis present

## 2020-04-14 DIAGNOSIS — Z86718 Personal history of other venous thrombosis and embolism: Secondary | ICD-10-CM | POA: Diagnosis not present

## 2020-04-14 DIAGNOSIS — R400 Somnolence: Secondary | ICD-10-CM | POA: Diagnosis not present

## 2020-04-14 DIAGNOSIS — J9621 Acute and chronic respiratory failure with hypoxia: Secondary | ICD-10-CM | POA: Diagnosis present

## 2020-04-14 DIAGNOSIS — G9341 Metabolic encephalopathy: Secondary | ICD-10-CM | POA: Diagnosis not present

## 2020-04-14 DIAGNOSIS — F32A Depression, unspecified: Secondary | ICD-10-CM | POA: Diagnosis present

## 2020-04-14 DIAGNOSIS — J8410 Pulmonary fibrosis, unspecified: Secondary | ICD-10-CM | POA: Diagnosis not present

## 2020-04-14 DIAGNOSIS — D8481 Immunodeficiency due to conditions classified elsewhere: Secondary | ICD-10-CM | POA: Diagnosis present

## 2020-04-14 DIAGNOSIS — J1282 Pneumonia due to coronavirus disease 2019: Secondary | ICD-10-CM | POA: Diagnosis present

## 2020-04-14 DIAGNOSIS — C3431 Malignant neoplasm of lower lobe, right bronchus or lung: Secondary | ICD-10-CM | POA: Diagnosis present

## 2020-04-14 DIAGNOSIS — D630 Anemia in neoplastic disease: Secondary | ICD-10-CM | POA: Diagnosis present

## 2020-04-14 DIAGNOSIS — S2241XA Multiple fractures of ribs, right side, initial encounter for closed fracture: Secondary | ICD-10-CM | POA: Diagnosis not present

## 2020-04-14 DIAGNOSIS — B957 Other staphylococcus as the cause of diseases classified elsewhere: Secondary | ICD-10-CM | POA: Diagnosis present

## 2020-04-14 DIAGNOSIS — Z781 Physical restraint status: Secondary | ICD-10-CM | POA: Diagnosis not present

## 2020-04-14 DIAGNOSIS — J441 Chronic obstructive pulmonary disease with (acute) exacerbation: Secondary | ICD-10-CM | POA: Diagnosis not present

## 2020-04-14 DIAGNOSIS — M2550 Pain in unspecified joint: Secondary | ICD-10-CM | POA: Diagnosis not present

## 2020-04-14 DIAGNOSIS — E870 Hyperosmolality and hypernatremia: Secondary | ICD-10-CM | POA: Diagnosis not present

## 2020-04-14 DIAGNOSIS — J9611 Chronic respiratory failure with hypoxia: Secondary | ICD-10-CM | POA: Diagnosis not present

## 2020-04-14 DIAGNOSIS — U071 COVID-19: Secondary | ICD-10-CM | POA: Diagnosis present

## 2020-04-14 LAB — CBC WITH DIFFERENTIAL/PLATELET
Abs Immature Granulocytes: 0.03 10*3/uL (ref 0.00–0.07)
Basophils Absolute: 0 10*3/uL (ref 0.0–0.1)
Basophils Relative: 0 %
Eosinophils Absolute: 0.1 10*3/uL (ref 0.0–0.5)
Eosinophils Relative: 2 %
HCT: 34.8 % — ABNORMAL LOW (ref 36.0–46.0)
Hemoglobin: 10.5 g/dL — ABNORMAL LOW (ref 12.0–15.0)
Immature Granulocytes: 0 %
Lymphocytes Relative: 14 %
Lymphs Abs: 1 10*3/uL (ref 0.7–4.0)
MCH: 25.5 pg — ABNORMAL LOW (ref 26.0–34.0)
MCHC: 30.2 g/dL (ref 30.0–36.0)
MCV: 84.7 fL (ref 80.0–100.0)
Monocytes Absolute: 0.8 10*3/uL (ref 0.1–1.0)
Monocytes Relative: 11 %
Neutro Abs: 5.5 10*3/uL (ref 1.7–7.7)
Neutrophils Relative %: 73 %
Platelets: 299 10*3/uL (ref 150–400)
RBC: 4.11 MIL/uL (ref 3.87–5.11)
RDW: 18.3 % — ABNORMAL HIGH (ref 11.5–15.5)
WBC: 7.6 10*3/uL (ref 4.0–10.5)
nRBC: 0 % (ref 0.0–0.2)

## 2020-04-14 LAB — CBC
HCT: 39.5 % (ref 36.0–46.0)
Hemoglobin: 11.7 g/dL — ABNORMAL LOW (ref 12.0–15.0)
MCH: 25.4 pg — ABNORMAL LOW (ref 26.0–34.0)
MCHC: 29.6 g/dL — ABNORMAL LOW (ref 30.0–36.0)
MCV: 85.9 fL (ref 80.0–100.0)
Platelets: 291 10*3/uL (ref 150–400)
RBC: 4.6 MIL/uL (ref 3.87–5.11)
RDW: 18.8 % — ABNORMAL HIGH (ref 11.5–15.5)
WBC: 6 10*3/uL (ref 4.0–10.5)
nRBC: 0 % (ref 0.0–0.2)

## 2020-04-14 LAB — URINALYSIS, COMPLETE (UACMP) WITH MICROSCOPIC
Bilirubin Urine: NEGATIVE
Glucose, UA: NEGATIVE mg/dL
Hgb urine dipstick: NEGATIVE
Ketones, ur: NEGATIVE mg/dL
Nitrite: NEGATIVE
Protein, ur: 30 mg/dL — AB
Specific Gravity, Urine: 1.015 (ref 1.005–1.030)
pH: 6 (ref 5.0–8.0)

## 2020-04-14 LAB — BLOOD CULTURE ID PANEL (REFLEXED) - BCID2

## 2020-04-14 LAB — D-DIMER, QUANTITATIVE: D-Dimer, Quant: 2.4 ug/mL-FEU — ABNORMAL HIGH (ref 0.00–0.50)

## 2020-04-14 LAB — COMPREHENSIVE METABOLIC PANEL
ALT: 42 U/L (ref 0–44)
AST: 222 U/L — ABNORMAL HIGH (ref 15–41)
Albumin: 2.6 g/dL — ABNORMAL LOW (ref 3.5–5.0)
Alkaline Phosphatase: 150 U/L — ABNORMAL HIGH (ref 38–126)
Anion gap: 13 (ref 5–15)
BUN: 11 mg/dL (ref 8–23)
CO2: 23 mmol/L (ref 22–32)
Calcium: 9.3 mg/dL (ref 8.9–10.3)
Chloride: 103 mmol/L (ref 98–111)
Creatinine, Ser: 1.11 mg/dL — ABNORMAL HIGH (ref 0.44–1.00)
GFR, Estimated: 51 mL/min — ABNORMAL LOW (ref >60)
Glucose, Bld: 95 mg/dL (ref 70–99)
Potassium: 4.3 mmol/L (ref 3.5–5.1)
Sodium: 139 mmol/L (ref 135–145)
Total Bilirubin: 0.7 mg/dL (ref 0.3–1.2)
Total Protein: 8.3 g/dL — ABNORMAL HIGH (ref 6.5–8.1)

## 2020-04-14 LAB — C-REACTIVE PROTEIN: CRP: 11.4 mg/dL — ABNORMAL HIGH (ref <1.0)

## 2020-04-14 LAB — TYPE AND SCREEN
ABO/RH(D): AB POS
ABO/RH(D): AB POS
Antibody Screen: NEGATIVE
Antibody Screen: NEGATIVE

## 2020-04-14 LAB — MAGNESIUM: Magnesium: 2 mg/dL (ref 1.7–2.4)

## 2020-04-14 LAB — I-STAT BETA HCG BLOOD, ED (MC, WL, AP ONLY): I-stat hCG, quantitative: <5 m[IU]/mL (ref <5)

## 2020-04-14 MED ORDER — IPRATROPIUM-ALBUTEROL 20-100 MCG/ACT IN AERS
2.0000 | INHALATION_SPRAY | Freq: Four times a day (QID) | RESPIRATORY_TRACT | Status: DC
  Administered 2020-04-14 – 2020-04-18 (×7): 2 via RESPIRATORY_TRACT
  Filled 2020-04-14: qty 4

## 2020-04-14 MED ORDER — BENZONATATE 100 MG PO CAPS
200.0000 mg | ORAL_CAPSULE | Freq: Three times a day (TID) | ORAL | Status: DC
  Administered 2020-04-14 – 2020-04-19 (×8): 200 mg via ORAL
  Filled 2020-04-14 (×12): qty 2

## 2020-04-14 MED ORDER — HALOPERIDOL LACTATE 5 MG/ML IJ SOLN
2.0000 mg | Freq: Four times a day (QID) | INTRAMUSCULAR | Status: DC | PRN
  Administered 2020-04-14 – 2020-04-17 (×3): 2 mg via INTRAVENOUS
  Filled 2020-04-14 (×3): qty 1

## 2020-04-14 MED ORDER — LORAZEPAM 2 MG/ML IJ SOLN
0.5000 mg | Freq: Four times a day (QID) | INTRAMUSCULAR | Status: DC | PRN
  Administered 2020-04-14 – 2020-04-15 (×2): 0.5 mg via INTRAVENOUS
  Filled 2020-04-14 (×2): qty 1

## 2020-04-14 MED ORDER — TRAZODONE HCL 50 MG PO TABS
50.0000 mg | ORAL_TABLET | Freq: Every day | ORAL | Status: DC
  Administered 2020-04-14: 50 mg via ORAL
  Filled 2020-04-14: qty 1

## 2020-04-14 MED ORDER — DULOXETINE HCL 60 MG PO CPEP
60.0000 mg | ORAL_CAPSULE | ORAL | Status: DC
  Administered 2020-04-17 – 2020-04-18 (×2): 60 mg via ORAL
  Filled 2020-04-14 (×5): qty 1

## 2020-04-14 MED ORDER — ALPRAZOLAM 0.5 MG PO TABS
1.0000 mg | ORAL_TABLET | Freq: Every day | ORAL | Status: DC
  Administered 2020-04-14 – 2020-04-15 (×2): 1 mg via ORAL
  Filled 2020-04-14 (×2): qty 2

## 2020-04-14 MED ORDER — HYDROCOD POLST-CPM POLST ER 10-8 MG/5ML PO SUER
5.0000 mL | Freq: Two times a day (BID) | ORAL | Status: DC | PRN
  Administered 2020-04-15: 5 mL via ORAL
  Filled 2020-04-14: qty 5

## 2020-04-14 MED ORDER — MORPHINE SULFATE (PF) 2 MG/ML IV SOLN: 2.0000 mg | INTRAVENOUS | Status: DC | PRN

## 2020-04-14 MED ORDER — OXYCODONE HCL 5 MG PO TABS
5.0000 mg | ORAL_TABLET | Freq: Four times a day (QID) | ORAL | Status: DC | PRN
  Administered 2020-04-17: 5 mg via ORAL
  Filled 2020-04-14 (×2): qty 1

## 2020-04-14 MED ORDER — MORPHINE SULFATE (PF) 2 MG/ML IV SOLN
1.0000 mg | INTRAVENOUS | Status: DC | PRN
  Administered 2020-04-14: 1 mg via INTRAVENOUS
  Filled 2020-04-14: qty 1

## 2020-04-14 MED ORDER — PREGABALIN 100 MG PO CAPS
100.0000 mg | ORAL_CAPSULE | Freq: Two times a day (BID) | ORAL | Status: DC
  Administered 2020-04-14 – 2020-04-18 (×5): 100 mg via ORAL
  Filled 2020-04-14 (×2): qty 1
  Filled 2020-04-14 (×2): qty 2
  Filled 2020-04-14 (×4): qty 1

## 2020-04-14 NOTE — ED Notes (Signed)
I set pt up with breakfast tray to eat.

## 2020-04-14 NOTE — ED Notes (Signed)
Pt won't keep pulse ox on and keeps taking all of her stuff off.

## 2020-04-14 NOTE — Progress Notes (Addendum)
PROGRESS NOTE                                                                                                                                                                                                             Patient Demographics:    [name], is a 79 y.o. female, DOB - [birth year], EHU:[id number]  Outpatient Primary MD for the patient is [name], FNP   Admit date - 04/13/2020   LOS - 0  Chief Complaint  Patient presents with  . Fall       Brief Narrative: Patient is a 79 y.o. 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  COVID-19 vaccinated status: Unvaccinated  Significant Events: 11/18>> Admit to [facility] for weakness/confusion/COVID-19 pneumonia  Significant studies: 11/18>> chest x-ray: Diffuse interstitial prominence throughout both lungs compatible with chronic lung disease/fibrosis-no definite airspace opacity 11/18>> pelvic x-ray: No acute bony abnormality. 11/18>> CT head/C-spine: No acute abnormality in head/no fractures in C-spine  COVID-19 medications: Steroids: 11/18>> Remdesivir: 11/18>> Monoclonal antibody: 11/16 x 1  Antibiotics: None  Microbiology data: 11/18 >>blood culture: Pending 11/18>> urine culture: Pending  Procedures: None  Consults: None  DVT prophylaxis: apixaban (ELIQUIS) tablet 2.5 mg Start: 04/13/20 2230 apixaban (ELIQUIS) tablet 2.5 mg    Subjective:    [name] today remains somewhat confused but redirectable-all she wants to do is just go home.  Stable on just 2 L of oxygen.   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  Follow clinical course closely.  She apparently is only on O2 at night at home-currently requiring oxygen also during the daytime.  Fever: afebrile O2 requirements:  SpO2: 95 % O2 Flow Rate (L/min): 2 L/min   COVID-19 Labs: Recent Labs    04/13/20 1855 04/14/20 0153  DDIMER 3.28* 2.40*  FERRITIN 32  --   LDH 201*  --   CRP 12.6* 11.4*    No results found for: BNP  Recent Labs  Lab 04/13/20 1855  PROCALCITON <0.10    No results found  for: SARSCOV2NAA   COPD exacerbation: Improving-continue steroids and bronchodilators.  On home nocturnal O2.  Acute metabolic encephalopathy: Confused but redirectable-daughter who I spoke to over the phone-denies any prior history of dementia (but acknowledges she could have mild dementia at baseline).  Nonfocal exam-CT head negative-follow clinical course-if no improvement-may need further work-up including MRI brain/EEG.  Not sure if she has some amount of toxic encephalopathy-she was recently started on transdermal fentanyl-and occasionally takes oxycodone as well.  Fall: Secondary debility/deconditioning at baseline-superimposed weakness due to COVID-19 infection.  Await PT/OT eval.  Anxiety/depression: Resume Xanax, Cymbalta and trazodone-see if resumption of these medications alleviates some of her encephalopathy.  Chronic pain syndrome-opiate dependent: Apparently on transdermal fentanyl that was started recently-given encephalopathy-this remains on hold-we will start her on as needed oxycodone for now and follow clinical course before resuming her usual narcotic regimen.  Squamous cell carcinoma of the lung-getting radiation treatments-resume outpatient follow-up with radiation oncology/medical oncology-could have some amount of fibrosis/inflammation in the lungs due to radiation.  On steroids.  Normocytic anemia: Hemoglobin admission was probably a lab error-no evidence of any GI loss-repeat  hemoglobin this morning back to baseline.  Follow CBC.   History of VTE: Continue Eliquis  HTN: BP controlled-continue amlodipine  HLD: Continue statin  GERD: Continue PPI  GI prophylaxis: PPI  ABG: No results found for: PHART, PCO2ART, PO2ART, HCO3, TCO2, ACIDBASEDEF, O2SAT  Vent Settings: N/A   Condition - Stable  Family Communication  : Daughter [name] [phone]) updated over the phone  Code Status :  Full Code  Diet :  Diet Order            Diet 2 gram sodium Room service appropriate? Yes; Fluid consistency: Thin  Diet effective now                  Disposition Plan  :   Status is: Observation  The patient will require care spanning > 2 midnights and should be moved to inpatient because: Inpatient level of care appropriate due to severity of illness  Dispo: The patient is from: Home              Anticipated d/c is to: TBD              Anticipated d/c date is: 2 days              Patient currently is not medically stable to d/c.   Barriers to discharge: Hypoxia requiring O2 supplementation/complete 5 days of IV Remdesivir  Antimicorbials  :    Anti-infectives (From admission, onward)   Start     Dose/Rate Route Frequency Ordered Stop   04/14/20 1000  remdesivir 100 mg in sodium chloride 0.9 % 100 mL IVPB       "Followed by" Linked Group Details   100 mg 200 mL/hr over 30 Minutes Intravenous Daily 04/13/20 2227 04/18/20 0959   04/13/20 2330  remdesivir 200 mg in sodium chloride 0.9% 250 mL IVPB       "Followed by" Linked Group Details   200 mg 580 mL/hr over 30 Minutes Intravenous Once 04/13/20 2227 04/14/20 0254      Inpatient Medications  Scheduled Meds: . ALPRAZolam  1 mg Oral QHS  . amLODipine  5 mg Oral Daily  . apixaban  2.5 mg Oral BID  . vitamin C  500 mg Oral Daily  . benzonatate  200 mg Oral TID  . dexamethasone (DECADRON) injection  6 mg Intravenous Q24H  .  DULoxetine  60 mg Oral BH-q7a  . Ipratropium-Albuterol  2 puff  Inhalation Q6H  . pantoprazole (PROTONIX) IV  40 mg Intravenous Q12H  . pravastatin  10 mg Oral q1800  . pregabalin  100 mg Oral BID  . traZODone  50 mg Oral QHS  . zinc sulfate  220 mg Oral Daily   Continuous Infusions: . lactated ringers Stopped (04/14/20 0439)   Followed by  . lactated ringers 50 mL/hr at 04/14/20 0846  . remdesivir 100 mg in NS 100 mL     PRN Meds:.acetaminophen, albuterol, chlorpheniramine-HYDROcodone, LORazepam, ondansetron **OR** ondansetron (ZOFRAN) IV, oxyCODONE, polyethylene glycol   Time Spent in minutes  25  See all Orders from today for further details   Oren Binet M.D on 04/14/2020 at 11:30 AM  To page go to [email or web address] - use universal password  [facility] -  Office  [phone]    Objective:   Vitals:   04/14/20 0936 04/14/20 0945 04/14/20 1000 04/14/20 1015  BP: 134/89     Pulse: (!) 125 (!) 113 (!) 127 (!) 139  Resp: 15 (!) 29 15 (!) 23  Temp:      TempSrc:      SpO2: 95% 92% (!) 88% 95%  Weight:      Height:        Wt Readings from Last 3 Encounters:  04/13/20 68 kg     Intake/Output Summary (Last 24 hours) at 04/14/2020 1130 Last data filed at 04/14/2020 0845 Gross per 24 hour  Intake 500 ml  Output 400 ml  Net 100 ml     Physical Exam Gen Exam: Alert-somewhat confused-not in any distress.  Although confused-redirectable. HEENT:atraumatic, normocephalic Chest: B/L clear to auscultation anteriorly CVS:S1S2 regular Abdomen:soft non tender, non distended Extremities:no edema Neurology: Non focal Skin: no rash   Data Review:    CBC Recent Labs  Lab 04/13/20 1855 04/14/20 0446 04/14/20 1015  WBC 10.5 7.6 6.0  HGB 8.6* 10.5* 11.7*  HCT 29.1* 34.8* 39.5  PLT 426* 299 291  MCV 88.2 84.7 85.9  MCH 26.1 25.5* 25.4*  MCHC 29.6* 30.2 29.6*  RDW 18.5* 18.3* 18.8*  LYMPHSABS 1.2 1.0  --   MONOABS 1.2* 0.8  --   EOSABS 0.2 0.1  --   BASOSABS 0.0 0.0  --     Chemistries  Recent Labs  Lab  04/13/20 1855 04/14/20 0153  NA 137 139  K 3.8 4.3  CL 104 103  CO2 22 23  GLUCOSE 127* 95  BUN 10 11  CREATININE 1.13* 1.11*  CALCIUM 9.5 9.3  MG  --  2.0  AST 34 222*  ALT 13 42  ALKPHOS 90 150*  BILITOT 0.5 0.7   ------------------------------------------------------------------------------------------------------------------ Recent Labs    04/13/20 1855  TRIG 111    No results found for: HGBA1C ------------------------------------------------------------------------------------------------------------------ No results for input(s): TSH, T4TOTAL, T3FREE, THYROIDAB in the last 72 hours.  Invalid input(s): FREET3 ------------------------------------------------------------------------------------------------------------------ Recent Labs    04/13/20 1855  FERRITIN 32    Coagulation profile No results for input(s): INR, PROTIME in the last 168 hours.  Recent Labs    04/13/20 1855 04/14/20 0153  DDIMER 3.28* 2.40*    Cardiac Enzymes No results for input(s): CKMB, TROPONINI, MYOGLOBIN in the last 168 hours.  Invalid input(s): CK ------------------------------------------------------------------------------------------------------------------ No results found for: BNP  Micro Results No results found for this or any previous visit (from the past 240 hour(s)).  Radiology Reports CT Head Wo Contrast  Result Date: 04/13/2020 CLINICAL  DATA:  79 year old female with head trauma. EXAM: CT HEAD WITHOUT CONTRAST CT CERVICAL SPINE WITHOUT CONTRAST TECHNIQUE: Multidetector CT imaging of the head and cervical spine was performed following the standard protocol without intravenous contrast. Multiplanar CT image reconstructions of the cervical spine were also generated. COMPARISON:  CT dated 08/28/2019. FINDINGS: CT HEAD FINDINGS Brain: Mild age-related atrophy and chronic microvascular ischemic changes. There is no acute intracranial hemorrhage. No mass effect or midline  shift. No extra-axial fluid collection. Vascular: No hyperdense vessel or unexpected calcification. Skull: Normal. Negative for fracture or focal lesion. Sinuses/Orbits: No acute finding. Other: None CT CERVICAL SPINE FINDINGS Alignment: No acute subluxation. Skull base and vertebrae: No acute fracture. Osteopenia. Soft tissues and spinal canal: No prevertebral fluid or swelling. No visible canal hematoma. Disc levels:  Degenerative changes. No acute findings. Upper chest: Small left upper lobe calcified granuloma. Partially visualized right subclavian central venous line. Other: Bilateral carotid bulb calcified plaques. IMPRESSION: 1. No acute intracranial pathology. Mild age-related atrophy and chronic microvascular ischemic changes. 2. No acute/traumatic cervical spine pathology. Electronically Signed   By: Anner Crete M.D.   On: 04/13/2020 20:44   CT Cervical Spine Wo Contrast  Result Date: 04/13/2020 CLINICAL DATA:  79 year old female with head trauma. EXAM: CT HEAD WITHOUT CONTRAST CT CERVICAL SPINE WITHOUT CONTRAST TECHNIQUE: Multidetector CT imaging of the head and cervical spine was performed following the standard protocol without intravenous contrast. Multiplanar CT image reconstructions of the cervical spine were also generated. COMPARISON:  CT dated 08/28/2019. FINDINGS: CT HEAD FINDINGS Brain: Mild age-related atrophy and chronic microvascular ischemic changes. There is no acute intracranial hemorrhage. No mass effect or midline shift. No extra-axial fluid collection. Vascular: No hyperdense vessel or unexpected calcification. Skull: Normal. Negative for fracture or focal lesion. Sinuses/Orbits: No acute finding. Other: None CT CERVICAL SPINE FINDINGS Alignment: No acute subluxation. Skull base and vertebrae: No acute fracture. Osteopenia. Soft tissues and spinal canal: No prevertebral fluid or swelling. No visible canal hematoma. Disc levels:  Degenerative changes. No acute findings. Upper  chest: Small left upper lobe calcified granuloma. Partially visualized right subclavian central venous line. Other: Bilateral carotid bulb calcified plaques. IMPRESSION: 1. No acute intracranial pathology. Mild age-related atrophy and chronic microvascular ischemic changes. 2. No acute/traumatic cervical spine pathology. Electronically Signed   By: Anner Crete M.D.   On: 04/13/2020 20:44   DG Pelvis Portable  Result Date: 04/13/2020 CLINICAL DATA:  Fall EXAM: PORTABLE PELVIS 1-2 VIEWS COMPARISON:  None. FINDINGS: Prior right hip replacement. Normal AP alignment. Mild degenerative changes within the left hip. SI joints symmetric and unremarkable. No acute bony abnormality. Specifically, no fracture, subluxation, or dislocation. IMPRESSION: No acute bony abnormality. Electronically Signed   By: Rolm Baptise M.D.   On: 04/13/2020 19:21   DG Chest Port 1 View  Result Date: 04/13/2020 CLINICAL DATA:  Fall, COVID EXAM: PORTABLE CHEST 1 VIEW COMPARISON:  04/11/2020 FINDINGS: Right Port-A-Cath remains in place, unchanged. Heart is normal size. Low lung volumes. Diffuse interstitial prominence throughout the lungs compatible with chronic lung disease/fibrosis. No definite acute confluent airspace opacity. Previously seen rounded mass posteriorly in the right lung better seen on prior CT. No effusions. IMPRESSION: Stable appearance of the chest. Electronically Signed   By: Rolm Baptise M.D.   On: 04/13/2020 19:21

## 2020-04-14 NOTE — Progress Notes (Signed)
PT Cancellation Note  Patient Details Name: [name] MRN: [id number] DOB: [birth year]   Cancelled Treatment:    Reason Eval/Treat Not Completed: Medical issues which prohibited therapy Pt agitated and in restraints in the ED and not appropriate to work with PT at this time. Will follow up as pt appropriate.   Reuel Derby, PT, DPT  Acute Rehabilitation Services  Pager: [phone] Office: [phone]    Rudean Hitt 04/14/2020, 10:40 AM

## 2020-04-14 NOTE — Progress Notes (Signed)
Patient admitted to 5W from ED. Patient is alert and oriented x1, very confused and having hallucinations. She can also be combative and is in 4- point restraints and has on patient safety mitts as well. Vital signs are stable except for HR which is staying in the 110s-120s. She is on room air and uses 2L at home, at baseline. Has no complaints of pain. Skin is intact, no signs of skin breakdown noted on exam. Patient belongings at bedside (cell phone and clothing). The patient was shown how to use the call bell. Call bell, phone and bedside table are within reach; bed is in the lowest position.

## 2020-04-14 NOTE — Progress Notes (Signed)
Remains confused-but redirectable-agitated-and hence probably tachycardic  Going to CT Chest  Add prn Haldol-await CT Chest-if remains agitated-will do further w/u-MRI/EEG etc  Have ordered safety sitter-as she can be redirected-and probably be calmer this way

## 2020-04-14 NOTE — ED Notes (Addendum)
HR in 130's to 140's.  Pt afebrile.  MD notified.  Attempted to climb out of bed.

## 2020-04-14 NOTE — Progress Notes (Signed)
   04/14/20 1800  Assess: MEWS Score  Temp 98.5 F (36.9 C)  BP (!) 151/95  Pulse Rate (!) 133  ECG Heart Rate (!) 132  Resp 18  Level of Consciousness Alert  SpO2 92 %  O2 Device Room Air  Assess: MEWS Score  MEWS Temp 0  MEWS Systolic 0  MEWS Pulse 3  MEWS RR 0  MEWS LOC 0  MEWS Score 3  MEWS Score Color Yellow  Assess: if the MEWS score is Yellow or Red  Were vital signs taken at a resting state? Yes  Focused Assessment No change from prior assessment  Early Detection of Sepsis Score *See Row Information* Low  MEWS guidelines implemented *See Row Information* Yes  Treat  Pain Scale 0-10  Pain Score 0  Take Vital Signs  Increase Vital Sign Frequency  Yellow: Q 2hr X 2 then Q 4hr X 2, if remains yellow, continue Q 4hrs  Escalate  MEWS: Escalate Yellow: discuss with charge nurse/RN and consider discussing with provider and RRT  Notify: Charge Nurse/RN  Name of Charge Nurse/RN Notified Charlynne Cousins  Date Charge Nurse/RN Notified 04/14/20  Time Charge Nurse/RN Notified 1847  Notify: Provider  Provider Name/Title Dr. Sloan Leiter  Date Provider Notified 04/14/20  Time Provider Notified [phone]  Notification Type Page  Notification Reason Other (Comment) (Per protocol, MEWS yellow)  Response No new orders  Date of Provider Response 04/14/20  Time of Provider Response 1900

## 2020-04-14 NOTE — ED Notes (Signed)
Pt is Sinus tach w/PAC's on monitor

## 2020-04-14 NOTE — ED Notes (Signed)
Attempted report 

## 2020-04-14 NOTE — ED Notes (Signed)
Patient verbally aggressive with nurse. Patient states "take these off of me so that I can get this stuff off me and go home". Patient referring to taking off restraints so that she can take her IV line and fluids off her. Patient also states "you are all lying to me and keeping me when there is nothing wrong with me."

## 2020-04-14 NOTE — ED Notes (Signed)
Provided update to pt's daughter, I let her know pt is stable and being admitted, but couldn't give more info, due to HIPAA.

## 2020-04-14 NOTE — ED Notes (Signed)
I went in to check on pt and pt ripped IV out and broke the IV tubing, pt pulled everything off and had blood all over the place and was trying to get OOB. I tried to redirect pt to stay in bed, but pt refused and became combative. I placed pt back on the soft wrist restraints bilat.

## 2020-04-14 NOTE — ED Notes (Signed)
Patient placed on bedpan.

## 2020-04-14 NOTE — ED Notes (Signed)
Patient continuing to scream "HELPPPPPPPP" and attempting to get out of restraints. Patient still not redirectable at this time.

## 2020-04-15 DIAGNOSIS — U071 COVID-19: Secondary | ICD-10-CM | POA: Diagnosis not present

## 2020-04-15 DIAGNOSIS — J441 Chronic obstructive pulmonary disease with (acute) exacerbation: Secondary | ICD-10-CM | POA: Diagnosis not present

## 2020-04-15 DIAGNOSIS — J9611 Chronic respiratory failure with hypoxia: Secondary | ICD-10-CM | POA: Diagnosis not present

## 2020-04-15 DIAGNOSIS — W19XXXA Unspecified fall, initial encounter: Secondary | ICD-10-CM | POA: Diagnosis not present

## 2020-04-15 LAB — URINE CULTURE: Culture: <10000 — AB

## 2020-04-15 LAB — CBC WITH DIFFERENTIAL/PLATELET
Abs Immature Granulocytes: 0.01 10*3/uL (ref 0.00–0.07)
Basophils Absolute: 0 10*3/uL (ref 0.0–0.1)
Basophils Relative: 0 %
Eosinophils Absolute: 0 10*3/uL (ref 0.0–0.5)
Eosinophils Relative: 0 %
HCT: 37.2 % (ref 36.0–46.0)
Hemoglobin: 11.3 g/dL — ABNORMAL LOW (ref 12.0–15.0)
Immature Granulocytes: 0 %
Lymphocytes Relative: 5 %
Lymphs Abs: 0.3 10*3/uL — ABNORMAL LOW (ref 0.7–4.0)
MCH: 25.3 pg — ABNORMAL LOW (ref 26.0–34.0)
MCHC: 30.4 g/dL (ref 30.0–36.0)
MCV: 83.4 fL (ref 80.0–100.0)
Monocytes Absolute: 0.2 10*3/uL (ref 0.1–1.0)
Monocytes Relative: 3 %
Neutro Abs: 5.8 10*3/uL (ref 1.7–7.7)
Neutrophils Relative %: 92 %
Platelets: 329 10*3/uL (ref 150–400)
RBC: 4.46 MIL/uL (ref 3.87–5.11)
RDW: 18.6 % — ABNORMAL HIGH (ref 11.5–15.5)
WBC: 6.4 10*3/uL (ref 4.0–10.5)
nRBC: 0 % (ref 0.0–0.2)

## 2020-04-15 LAB — COMPREHENSIVE METABOLIC PANEL
ALT: 31 U/L (ref 0–44)
AST: 77 U/L — ABNORMAL HIGH (ref 15–41)
Albumin: 2.6 g/dL — ABNORMAL LOW (ref 3.5–5.0)
Alkaline Phosphatase: 131 U/L — ABNORMAL HIGH (ref 38–126)
Anion gap: 12 (ref 5–15)
BUN: 9 mg/dL (ref 8–23)
CO2: 22 mmol/L (ref 22–32)
Calcium: 9.6 mg/dL (ref 8.9–10.3)
Chloride: 107 mmol/L (ref 98–111)
Creatinine, Ser: 0.77 mg/dL (ref 0.44–1.00)
GFR, Estimated: >60 mL/min (ref >60)
Glucose, Bld: 123 mg/dL — ABNORMAL HIGH (ref 70–99)
Potassium: 3.7 mmol/L (ref 3.5–5.1)
Sodium: 141 mmol/L (ref 135–145)
Total Bilirubin: 0.7 mg/dL (ref 0.3–1.2)
Total Protein: 8.6 g/dL — ABNORMAL HIGH (ref 6.5–8.1)

## 2020-04-15 LAB — CULTURE, BLOOD (ROUTINE X 2)

## 2020-04-15 LAB — C-REACTIVE PROTEIN: CRP: 7.6 mg/dL — ABNORMAL HIGH (ref <1.0)

## 2020-04-15 LAB — MAGNESIUM: Magnesium: 2 mg/dL (ref 1.7–2.4)

## 2020-04-15 LAB — D-DIMER, QUANTITATIVE: D-Dimer, Quant: 3.26 ug/mL-FEU — ABNORMAL HIGH (ref 0.00–0.50)

## 2020-04-15 MED ORDER — VANCOMYCIN HCL IN DEXTROSE 1-5 GM/200ML-% IV SOLN
1000.0000 mg | Freq: Once | INTRAVENOUS | Status: AC
  Administered 2020-04-16: 1000 mg via INTRAVENOUS
  Filled 2020-04-15: qty 200

## 2020-04-15 MED ORDER — VANCOMYCIN HCL 750 MG/150ML IV SOLN
750.0000 mg | INTRAVENOUS | Status: DC
  Administered 2020-04-16 – 2020-04-17 (×2): 750 mg via INTRAVENOUS
  Filled 2020-04-15 (×3): qty 150

## 2020-04-15 MED ORDER — LORAZEPAM 2 MG/ML IJ SOLN
1.0000 mg | Freq: Once | INTRAMUSCULAR | Status: AC | PRN
  Administered 2020-04-17: 1 mg via INTRAVENOUS
  Filled 2020-04-15: qty 1

## 2020-04-15 MED ORDER — VANCOMYCIN HCL IN DEXTROSE 1-5 GM/200ML-% IV SOLN
1000.0000 mg | Freq: Once | INTRAVENOUS | Status: DC
  Filled 2020-04-15: qty 200

## 2020-04-15 MED ORDER — QUETIAPINE FUMARATE 25 MG PO TABS
25.0000 mg | ORAL_TABLET | Freq: Every day | ORAL | Status: DC
  Administered 2020-04-15: 25 mg via ORAL
  Filled 2020-04-15: qty 1

## 2020-04-15 MED ORDER — VANCOMYCIN HCL 750 MG/150ML IV SOLN: 750.0000 mg | INTRAVENOUS | Status: DC

## 2020-04-15 NOTE — Progress Notes (Signed)
Per RN pt is in restraints, unable to hold still, and confused. Holding off on MRI until pt is in better state to pursue exam.

## 2020-04-15 NOTE — Evaluation (Signed)
Occupational Therapy Evaluation Patient Details Name: [name] MRN: [id number] DOB: 1940-10-02 Today's Date: 04/15/2020    History of Present Illness 80 yo female presenting with fall, generalized weakness, and confusion. Diagnosed with COVID-19 pneumonia on 11/16. Unvaccinated. 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   Clinical Impression   Per pt, she was living with her daughter; difficult to follow PLOF and home support as pt presenting with confusion and poor cognition. Deferred OOB activity for safety. Pt currently requiring Min A for grooming and Max A for ADLs due to cognition. Pt with decreased attention, problem solving, and orientation (only oriented to self). Pt would benefit from further acute OT to facilitate safe dc. Recommend dc to SNF for further OT to optimize safety, independence with ADLs, and return to PLOF.    Follow Up Recommendations  SNF (Pending confusion and progress)    Equipment Recommendations  Other (comment) (Defer to next venue)    Recommendations for Other Services PT consult     Precautions / Restrictions Precautions Precautions: Fall Precaution Comments: Agitation and confusion Restrictions Weight Bearing Restrictions: No      Mobility Bed Mobility Overal bed mobility: Needs Assistance             General bed mobility comments: Pt able to grab bed rails and then pull into long sitting.    Transfers                 General transfer comment: Defer for safety    Balance                                           ADL either performed or assessed with clinical judgement   ADL Overall ADL's : Needs assistance/impaired Eating/Feeding: Maximal assistance;Bed level Eating/Feeding Details (indicate cue type and reason): Pt declining to drink from cup with straw. Requiring Max A for bring cup to mouth  without spilling and then pt taking one sip and then spitting drink out.  Grooming: Minimal assistance;Brushing hair;Bed level Grooming Details (indicate cue type and reason): Pt brushing her hair with Min A for brushing her hair; unable to reach back of head.                                General ADL Comments: Due to cognition, pt with poor performance of ADLs. Requiring Max A for ADLs     Vision         Perception     Praxis      Pertinent Vitals/Pain Pain Assessment: No/denies pain     Hand Dominance Right   Extremity/Trunk Assessment Upper Extremity Assessment Upper Extremity Assessment: Overall WFL for tasks assessed   Lower Extremity Assessment Lower Extremity Assessment: Defer to PT evaluation       Communication Communication Communication: No difficulties   Cognition Arousal/Alertness: Awake/alert Behavior During Therapy: Impulsive;Restless Overall Cognitive Status: No family/caregiver present to determine baseline cognitive functioning                                 General Comments: Pt very restless and confused. Poor attention and following commands. When asking pt who she lives  with she stated "Santiago Glad". OT then asked who is Santiago Glad. Pt replied "[name]." OT asked who is International aid/development worker. Pt replied "Lennox Grumbles". OT asked who is Management consultant. Pt stated "I am wastching you."   General Comments  HR 120s in bed. At end of session, pt becoming more agitated and HR elevating to 141. SpO2 90s on RA    Exercises     Shoulder Instructions      Home Living Family/patient expects to be discharged to:: Private residence                                 Additional Comments: Per chart review, pt from home. Pt reporting she lives with her daughter. Not able to provide further information due to confusion.      Prior Functioning/Environment          Comments: Pt unable to provide information due to confusion        OT Problem List: Decreased  strength;Decreased range of motion;Decreased activity tolerance;Impaired balance (sitting and/or standing);Decreased knowledge of precautions;Cardiopulmonary status limiting activity;Decreased cognition;Decreased safety awareness;Decreased knowledge of use of DME or AE      OT Treatment/Interventions: Self-care/ADL training;Therapeutic exercise;Energy conservation;DME and/or AE instruction;Therapeutic activities;Patient/family education    OT Goals(Current goals can be found in the care plan section) Acute Rehab OT Goals Patient Stated Goal: "I need to leave" OT Goal Formulation: With patient Time For Goal Achievement: 04/29/20 Potential to Achieve Goals: Good  OT Frequency: Min 2X/week   Barriers to D/C:            Co-evaluation              AM-PAC OT "6 Clicks" Daily Activity     Outcome Measure Help from another person eating meals?: A Lot Help from another person taking care of personal grooming?: A Little Help from another person toileting, which includes using toliet, bedpan, or urinal?: A Lot Help from another person bathing (including washing, rinsing, drying)?: A Lot Help from another person to put on and taking off regular upper body clothing?: A Lot Help from another person to put on and taking off regular lower body clothing?: A Lot 6 Click Score: 13   End of Session Nurse Communication: Mobility status  Activity Tolerance: Treatment limited secondary to agitation Patient left: in bed;with call bell/phone within reach;with bed alarm set;with nursing/sitter in room  OT Visit Diagnosis: Unsteadiness on feet (R26.81);Other abnormalities of gait and mobility (R26.89);Muscle weakness (generalized) (M62.81)                Time: 7342-8768 OT Time Calculation (min): 17 min Charges:  OT General Charges $OT Visit: 1 Visit OT Evaluation $OT Eval Moderate Complexity: Ashley, OTR/L Acute Rehab Pager: [phone] Office: [place] 04/15/2020, 2:38 PM

## 2020-04-15 NOTE — Progress Notes (Signed)
PT Cancellation Note  Patient Details Name: [name] MRN: [id number] DOB: [birth year]   Cancelled Treatment:    Reason Eval/Treat Not Completed: (P) Medical issues which prohibited therapy Pt with continued encephalopathy, in restraints and unable to participate. RN request follow back for Evaluation tomorrow.   Vandora Jaskulski B. Migdalia Dk PT, DPT Acute Rehabilitation Services Pager [phone] Office [phone]    [place] 04/15/2020, 1:38 PM

## 2020-04-15 NOTE — Progress Notes (Signed)
Pharmacy Antibiotic Note  [name] is a 79 y.o. female admitted on 04/13/2020 with sepsis.  Pharmacy has been consulted for vanc dosing.  Pt has been here for COVID. She also has squamous cell carcinoma and a porta cath in place. She had blood cultures drawn the other day but only one bottle was collected. It came back positive for staph species on the BCID. It could still be a contaminant. We are repeating blood cultures today and empirically start her one empiric vanc.   Plan: Vanc 1g IV x1 then 750mg  IV q24 F/u with blood cultures Level if needed  Height: 5\' 4"  (162.6 cm) Weight: 68 kg (150 lb) IBW/kg (Calculated) : 54.7  Temp (24hrs), Avg:98.2 F (36.8 C), Min:98 F (36.7 C), Max:98.5 F (36.9 C)  Recent Labs  Lab 04/13/20 1855 04/14/20 0153 04/14/20 0446 04/14/20 1015 04/15/20 0123  WBC 10.5  --  7.6 6.0 6.4  CREATININE 1.13* 1.11*  --   --  0.77    Estimated Creatinine Clearance: 54.9 mL/min (by C-G formula based on SCr of 0.77 mg/dL).    Allergies  Allergen Reactions  . Ampicillin Itching and Nausea And Vomiting  . Other     Muscle relaxer  . Sulfa Antibiotics Itching and Nausea And Vomiting  . Toradol [Ketorolac Tromethamine] Itching and Nausea And Vomiting  . Tramadol Itching and Nausea And Vomiting    Antimicrobials this admission: 11/20 vanc >>  Dose adjustments this admission:   Microbiology results: 11/18 urine>>neg 11/18 blood (1bottle drawn)>> staph species 11/20 blood>>  Onnie Boer, PharmD, [place], AAHIVP, CPP Infectious Disease Pharmacist 04/15/2020 2:13 PM

## 2020-04-15 NOTE — Progress Notes (Signed)
PROGRESS NOTE                                                                                                                                                                                                             Patient Demographics:    [name], is a 79 y.o. female, DOB - [birth year], AGT:[id number]  Outpatient Primary MD for the patient is [name], FNP   Admit date - 04/13/2020   LOS - 1  Chief Complaint  Patient presents with  . Fall       Brief Narrative: Patient is a 79 y.o. 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  COVID-19 vaccinated status: Unvaccinated  Significant Events: 11/18>> Admit to [facility] for weakness/confusion/COVID-19 pneumonia  Significant studies: 11/18>> chest x-ray: Diffuse interstitial prominence throughout both lungs compatible with chronic lung disease/fibrosis-no definite airspace opacity 11/18>> pelvic x-ray: No acute bony abnormality. 11/18>> CT head/C-spine: No acute abnormality in head/no fractures in C-spine 11/19>> CT chest: Partially necrotic mass in the right lower lobe-fibrosis throughout both lungs.  CAD.  COVID-19 medications: Steroids: 11/18>> Remdesivir: 11/18>> Monoclonal antibody: 11/16 x 1  Antibiotics: None  Microbiology data: 11/20>> blood cultures: Ordered 11/18 >>blood culture: Gram-positive cocci (BC ID-staph species-could be a contaminant) 11/18>> urine culture: <10,007 colonies/mL-insignificant growth  Procedures: None  Consults: None  DVT prophylaxis: apixaban (ELIQUIS) tablet 2.5 mg Start: 04/13/20 2230 apixaban (ELIQUIS) tablet 2.5 mg    Subjective:   Remains confused-at times redirectable-uses  profanities-in restraints this morning which I removed.  Moving all 4 extremities.   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  Apparently on home O2-mostly at  Fever: afebrile O2 requirements:  SpO2: 92 % O2 Flow Rate (L/min): 2 L/min   COVID-19 Labs: Recent Labs    04/13/20 1855 04/14/20 0153 04/15/20 0123  DDIMER 3.28* 2.40* 3.26*  FERRITIN 32  --   --   LDH 201*  --   --   CRP  12.6* 11.4* 7.6*    No results found for: BNP  Recent Labs  Lab 04/13/20 1855  PROCALCITON <0.10    No results found for: SARSCOV2NAA   COPD exacerbation: Improving-continue steroids and bronchodilators.  On home nocturnal O2.  Gram-positive bacteremia: Reviewed BCID results-probably may be a contaminant-however has a Port-A-Cath-immunocompromised and getting active radiation treatment-repeat blood cultures-start IV vancomycin-await final culture results.  Acute metabolic encephalopathy: Continues to be confused-no improvement overnight.  CT imaging negative for acute abnormalities on admission.  She was recently started on transdermal fentanyl-but no improvement after stopping narcotics.  Etiology unclear-could be related to COVID infection-could be related to potential bacteremia.  Spoke with daughter-she is concerned about "Covid brain syndrome"-explained that this is a diagnosis of exclusion-we will initiate work-up with MRI brain, EEG-starting IV vancomycin-if no etiology evident-may need neurology evaluation.  Per daughter-patient could have some mild cognitive dysfunction but she does not think patient has clinical features consistent with dementia.  Will add Seroquel nightly-have asked charge nurse to see if we can discontinue restraints and have ordered a  bedside sitter for safety.  Fall: Secondary debility/deconditioning at baseline-superimposed weakness due to COVID-19 infection.  Await PT/OT eval.  Anxiety/depression: Have restarted Xanax/Cymbalta-on Seroquel nightly starting today for severe delirium/encephalopathy.  Reassess tomorrow.    Sinus tachycardia: Mild-fluctuating heart rate-mostly gets tachycardic when she is agitated.  Patient is pulling out her telemetry leads-and will not let nursing staff patient back again.  We will go and discontinue telemetry for now-may help keep her calm and manage her delirium/encephalopathy.  Chronic pain syndrome-opiate dependent: Apparently recently started on transdermal fentanyl-this remains on hold-given few doses of IV morphine yesterday without any improvement in her mental status.    Squamous cell carcinoma of the lung-getting radiation treatments-resume outpatient follow-up with radiation oncology/medical oncology-could have some amount of fibrosis/inflammation in the lungs due to radiation.  On steroids.  Per daughter-patient on radiation treatment as was deemed to be a poor candidate for chemotherapy due to overall medical comorbidities.  Normocytic anemia: Hemoglobin admission was probably a lab error-no evidence of any GI loss-repeat hemoglobin this morning back to baseline.  Follow CBC.   History of VTE: Continue Eliquis  HTN: BP controlled-continue amlodipine  HLD: Continue statin  GERD: Continue PPI  Goals of care: Long discussion with patient's daughter over the phone this morning-explained that work-up is in progress-to determine etiology of encephalopathy-COVID-19 infection continues to be treated with steroid/Remdesivir.  Explained that patient is not a good candidate for aggressive care given her above medical comorbidities.  Per patient's daughter-patient would not want to be kept alive with ventilator support or CPR.  She is agreeable for a DNR order.  GI prophylaxis:  PPI  ABG: No results found for: PHART, PCO2ART, PO2ART, HCO3, TCO2, ACIDBASEDEF, O2SAT  Vent Settings: N/A   Condition - Stable  Family Communication  : Daughter [name] [phone]) updated over the phone on 11/20-  Code Status :  Full Code  Diet :  Diet Order            Diet 2 gram sodium Room service appropriate? Yes; Fluid consistency: Thin  Diet effective now                  Disposition Plan  :   Status is: Inpatient  The patient will require care spanning > 2 midnights and should be moved to inpatient because: Inpatient level of care appropriate due to severity of illness  Dispo: The patient is from:  Home              Anticipated d/c is to: TBD              Anticipated d/c date is: 2 days              Patient currently is not medically stable to d/c.   Barriers to discharge: Hypoxia requiring O2 supplementation/complete 5 days of IV Remdesivir  Antimicorbials  :    Anti-infectives (From admission, onward)   Start     Dose/Rate Route Frequency Ordered Stop   04/14/20 1000  remdesivir 100 mg in sodium chloride 0.9 % 100 mL IVPB       "Followed by" Linked Group Details   100 mg 200 mL/hr over 30 Minutes Intravenous Daily 04/13/20 2227 04/18/20 0959   04/13/20 2330  remdesivir 200 mg in sodium chloride 0.9% 250 mL IVPB       "Followed by" Linked Group Details   200 mg 580 mL/hr over 30 Minutes Intravenous Once 04/13/20 2227 04/14/20 0254      Inpatient Medications  Scheduled Meds: . ALPRAZolam  1 mg Oral QHS  . amLODipine  5 mg Oral Daily  . apixaban  2.5 mg Oral BID  . vitamin C  500 mg Oral Daily  . benzonatate  200 mg Oral TID  . dexamethasone (DECADRON) injection  6 mg Intravenous Q24H  . DULoxetine  60 mg Oral BH-q7a  . Ipratropium-Albuterol  2 puff Inhalation Q6H  . pantoprazole (PROTONIX) IV  40 mg Intravenous Q12H  . pravastatin  10 mg Oral q1800  . pregabalin  100 mg Oral BID  . QUEtiapine  25 mg Oral QHS  . zinc sulfate  220 mg  Oral Daily   Continuous Infusions: . remdesivir 100 mg in NS 100 mL Stopped (04/15/20 1024)   PRN Meds:.acetaminophen, albuterol, chlorpheniramine-HYDROcodone, haloperidol lactate, LORazepam, LORazepam, morphine injection, ondansetron **OR** ondansetron (ZOFRAN) IV, oxyCODONE, polyethylene glycol   Time Spent in minutes  25  See all Orders from today for further details   Oren Binet M.D on 04/15/2020 at 1:47 PM  To page go to [email or web address] - use universal password  [facility] -  Office  [phone]    Objective:   Vitals:   04/14/20 2007 04/14/20 2157 04/15/20 0210 04/15/20 0547  BP: (!) 133/110 123/87 117/78 134/84  Pulse: (!) 116 (!) 108 99 98  Resp: 18 15 19 20   Temp: 98.1 F (36.7 C) 98.2 F (36.8 C) 98 F (36.7 C) 98.1 F (36.7 C)  TempSrc: Oral Oral Oral Oral  SpO2: 92% 93% 93% 92%  Weight:      Height:        Wt Readings from Last 3 Encounters:  04/13/20 68 kg     Intake/Output Summary (Last 24 hours) at 04/15/2020 1347 Last data filed at 04/15/2020 1024 Gross per 24 hour  Intake 600 ml  Output --  Net 600 ml     Physical Exam Gen Exam: Confused-using profanities at times.  But not in any distress. HEENT:atraumatic, normocephalic Chest: B/L clear to auscultation anteriorly CVS:S1S2 regular Abdomen:soft non tender, non distended Extremities:no edema Neurology: Difficult exam but moving all 4 extremities. Skin: no rash   Data Review:    CBC Recent Labs  Lab 04/13/20 1855 04/14/20 0446 04/14/20 1015 04/15/20 0123  WBC 10.5 7.6 6.0 6.4  HGB 8.6* 10.5* 11.7* 11.3*  HCT 29.1* 34.8* 39.5 37.2  PLT 426* 299 291 329  MCV 88.2 84.7 85.9  83.4  MCH 26.1 25.5* 25.4* 25.3*  MCHC 29.6* 30.2 29.6* 30.4  RDW 18.5* 18.3* 18.8* 18.6*  LYMPHSABS 1.2 1.0  --  0.3*  MONOABS 1.2* 0.8  --  0.2  EOSABS 0.2 0.1  --  0.0  BASOSABS 0.0 0.0  --  0.0    Chemistries  Recent Labs  Lab 04/13/20 1855 04/14/20 0153 04/15/20 0123  NA 137 139  141  K 3.8 4.3 3.7  CL 104 103 107  CO2 22 23 22   GLUCOSE 127* 95 123*  BUN 10 11 9   CREATININE 1.13* 1.11* 0.77  CALCIUM 9.5 9.3 9.6  MG  --  2.0 2.0  AST 34 222* 77*  ALT 13 42 31  ALKPHOS 90 150* 131*  BILITOT 0.5 0.7 0.7   ------------------------------------------------------------------------------------------------------------------ Recent Labs    04/13/20 1855  TRIG 111    No results found for: HGBA1C ------------------------------------------------------------------------------------------------------------------ No results for input(s): TSH, T4TOTAL, T3FREE, THYROIDAB in the last 72 hours.  Invalid input(s): FREET3 ------------------------------------------------------------------------------------------------------------------ Recent Labs    04/13/20 1855  FERRITIN 32    Coagulation profile No results for input(s): INR, PROTIME in the last 168 hours.  Recent Labs    04/14/20 0153 04/15/20 0123  DDIMER 2.40* 3.26*    Cardiac Enzymes No results for input(s): CKMB, TROPONINI, MYOGLOBIN in the last 168 hours.  Invalid input(s): CK ------------------------------------------------------------------------------------------------------------------ No results found for: BNP  Micro Results Recent Results (from the past 240 hour(s))  Culture, Urine     Status: Abnormal   Collection Time: 04/13/20  6:59 AM   Specimen: Urine, Random  Result Value Ref Range Status   Specimen Description URINE, RANDOM  Final   Special Requests NONE  Final   Culture (A)  Final    <10,000 COLONIES/mL INSIGNIFICANT GROWTH Performed at [facility], [address], [place], [place] [address]    Report Status 04/15/2020 FINAL  Final  Blood Culture (routine x 2)     Status: None (Preliminary result)   Collection Time: 04/13/20  7:00 PM   Specimen: BLOOD  Result Value Ref Range Status   Specimen Description BLOOD SITE NOT SPECIFIED  Final   Special Requests   Final     BOTTLES DRAWN AEROBIC ONLY Blood Culture results may not be optimal due to an inadequate volume of blood received in culture bottles   Culture  Setup Time   Final    GRAM POSITIVE COCCI AEROBIC BOTTLE ONLY Organism ID to follow CRITICAL RESULT CALLED TO, READ BACK BY AND VERIFIED WITH: Ellin Mayhew [facility] 04/14/20 AT 4656 SK Performed at [facility], [place] [address], [place], [place] [address]    Culture GRAM POSITIVE COCCI  Final   Report Status PENDING  Incomplete  Blood Culture ID Panel (Reflexed)     Status: Abnormal   Collection Time: 04/13/20  7:00 PM  Result Value Ref Range Status   Enterococcus faecalis NOT DETECTED NOT DETECTED Final   Enterococcus Faecium NOT DETECTED NOT DETECTED Final   Listeria monocytogenes NOT DETECTED NOT DETECTED Final   Staphylococcus species DETECTED (A) NOT DETECTED Final    Comment: CRITICAL RESULT CALLED TO, READ BACK BY AND VERIFIED WITH: PHARMD M MACCIA 04/14/20 AT 1853 SK    Staphylococcus aureus (BCID) NOT DETECTED NOT DETECTED Final   Staphylococcus epidermidis NOT DETECTED NOT DETECTED Final   Staphylococcus lugdunensis NOT DETECTED NOT DETECTED Final   Streptococcus species NOT DETECTED NOT DETECTED Final   Streptococcus agalactiae NOT DETECTED NOT  DETECTED Final   Streptococcus pneumoniae NOT DETECTED NOT DETECTED Final   Streptococcus pyogenes NOT DETECTED NOT DETECTED Final   A.calcoaceticus-baumannii NOT DETECTED NOT DETECTED Final   Bacteroides fragilis NOT DETECTED NOT DETECTED Final   Enterobacterales NOT DETECTED NOT DETECTED Final   Enterobacter cloacae complex NOT DETECTED NOT DETECTED Final   Escherichia coli NOT DETECTED NOT DETECTED Final   Klebsiella aerogenes NOT DETECTED NOT DETECTED Final   Klebsiella oxytoca NOT DETECTED NOT DETECTED Final   Klebsiella pneumoniae NOT DETECTED NOT DETECTED Final   Proteus species NOT DETECTED NOT DETECTED Final   Salmonella species NOT DETECTED NOT DETECTED Final   Serratia  marcescens NOT DETECTED NOT DETECTED Final   Haemophilus influenzae NOT DETECTED NOT DETECTED Final   Neisseria meningitidis NOT DETECTED NOT DETECTED Final   Pseudomonas aeruginosa NOT DETECTED NOT DETECTED Final   Stenotrophomonas maltophilia NOT DETECTED NOT DETECTED Final   Candida albicans NOT DETECTED NOT DETECTED Final   Candida auris NOT DETECTED NOT DETECTED Final   Candida glabrata NOT DETECTED NOT DETECTED Final   Candida krusei NOT DETECTED NOT DETECTED Final   Candida parapsilosis NOT DETECTED NOT DETECTED Final   Candida tropicalis NOT DETECTED NOT DETECTED Final   Cryptococcus neoformans/gattii NOT DETECTED NOT DETECTED Final    Comment: Performed at [facility], [address], [place], [place] [address]    Radiology Reports CT Head Wo Contrast  Result Date: 04/13/2020 CLINICAL DATA:  79 year old female with head trauma. EXAM: CT HEAD WITHOUT CONTRAST CT CERVICAL SPINE WITHOUT CONTRAST TECHNIQUE: Multidetector CT imaging of the head and cervical spine was performed following the standard protocol without intravenous contrast. Multiplanar CT image reconstructions of the cervical spine were also generated. COMPARISON:  CT dated 08/28/2019. FINDINGS: CT HEAD FINDINGS Brain: Mild age-related atrophy and chronic microvascular ischemic changes. There is no acute intracranial hemorrhage. No mass effect or midline shift. No extra-axial fluid collection. Vascular: No hyperdense vessel or unexpected calcification. Skull: Normal. Negative for fracture or focal lesion. Sinuses/Orbits: No acute finding. Other: None CT CERVICAL SPINE FINDINGS Alignment: No acute subluxation. Skull base and vertebrae: No acute fracture. Osteopenia. Soft tissues and spinal canal: No prevertebral fluid or swelling. No visible canal hematoma. Disc levels:  Degenerative changes. No acute findings. Upper chest: Small left upper lobe calcified granuloma. Partially visualized right subclavian central venous line.  Other: Bilateral carotid bulb calcified plaques. IMPRESSION: 1. No acute intracranial pathology. Mild age-related atrophy and chronic microvascular ischemic changes. 2. No acute/traumatic cervical spine pathology. Electronically Signed   By: Anner Crete M.D.   On: 04/13/2020 20:44   CT CHEST WO CONTRAST  Result Date: 04/14/2020 CLINICAL DATA:  Respiratory illness EXAM: CT CHEST WITHOUT CONTRAST TECHNIQUE: Multidetector CT imaging of the chest was performed following the standard protocol without IV contrast. COMPARISON:  Chest x-ray 04/13/2020.  CT 04/11/2020 FINDINGS: Cardiovascular: Aortic atherosclerosis. Scattered coronary artery calcifications. Heart is normal size and aorta is normal caliber. Mediastinum/Nodes: Small scattered non pathologically enlarged mediastinal lymph nodes, stable. No mediastinal, hilar, or axillary adenopathy. Trachea and esophagus are unremarkable. Thyroid unremarkable. Lungs/Pleura: Peripheral interstitial prominence in thickening compatible with fibrosis, stable. Rounded partially necrotic mass in the posterior right lower lobe measures 3.1 cm and is slightly decreased in size since prior study when this measured 3.8 cm. No effusions. No confluent opacities on the left. Calcified granuloma in the left upper lobe. Upper Abdomen: Imaging into the upper abdomen demonstrates no acute findings. Musculoskeletal: Chest wall soft tissues are unremarkable. Right  upper chest wall Port-A-Cath remains in place, unchanged. No acute bony abnormality. Old posterior right 7th rib fracture and upper thoracic compression fractures, unchanged. IMPRESSION: Partially necrotic mass in the posterior right lower lobe is again noted, slightly decreased in size since prior study, 3.1 cm currently compared to 3.8 cm previously. Changes of fibrosis throughout the lungs. Coronary artery disease. Aortic Atherosclerosis (ICD10-I70.0). Electronically Signed   By: Rolm Baptise M.D.   On: 04/14/2020 18:33    CT Cervical Spine Wo Contrast  Result Date: 04/13/2020 CLINICAL DATA:  79 year old female with head trauma. EXAM: CT HEAD WITHOUT CONTRAST CT CERVICAL SPINE WITHOUT CONTRAST TECHNIQUE: Multidetector CT imaging of the head and cervical spine was performed following the standard protocol without intravenous contrast. Multiplanar CT image reconstructions of the cervical spine were also generated. COMPARISON:  CT dated 08/28/2019. FINDINGS: CT HEAD FINDINGS Brain: Mild age-related atrophy and chronic microvascular ischemic changes. There is no acute intracranial hemorrhage. No mass effect or midline shift. No extra-axial fluid collection. Vascular: No hyperdense vessel or unexpected calcification. Skull: Normal. Negative for fracture or focal lesion. Sinuses/Orbits: No acute finding. Other: None CT CERVICAL SPINE FINDINGS Alignment: No acute subluxation. Skull base and vertebrae: No acute fracture. Osteopenia. Soft tissues and spinal canal: No prevertebral fluid or swelling. No visible canal hematoma. Disc levels:  Degenerative changes. No acute findings. Upper chest: Small left upper lobe calcified granuloma. Partially visualized right subclavian central venous line. Other: Bilateral carotid bulb calcified plaques. IMPRESSION: 1. No acute intracranial pathology. Mild age-related atrophy and chronic microvascular ischemic changes. 2. No acute/traumatic cervical spine pathology. Electronically Signed   By: Anner Crete M.D.   On: 04/13/2020 20:44   DG Pelvis Portable  Result Date: 04/13/2020 CLINICAL DATA:  Fall EXAM: PORTABLE PELVIS 1-2 VIEWS COMPARISON:  None. FINDINGS: Prior right hip replacement. Normal AP alignment. Mild degenerative changes within the left hip. SI joints symmetric and unremarkable. No acute bony abnormality. Specifically, no fracture, subluxation, or dislocation. IMPRESSION: No acute bony abnormality. Electronically Signed   By: Rolm Baptise M.D.   On: 04/13/2020 19:21   DG Chest  Port 1 View  Result Date: 04/13/2020 CLINICAL DATA:  Fall, COVID EXAM: PORTABLE CHEST 1 VIEW COMPARISON:  04/11/2020 FINDINGS: Right Port-A-Cath remains in place, unchanged. Heart is normal size. Low lung volumes. Diffuse interstitial prominence throughout the lungs compatible with chronic lung disease/fibrosis. No definite acute confluent airspace opacity. Previously seen rounded mass posteriorly in the right lung better seen on prior CT. No effusions. IMPRESSION: Stable appearance of the chest. Electronically Signed   By: Rolm Baptise M.D.   On: 04/13/2020 19:21

## 2020-04-16 ENCOUNTER — Ambulatory Visit: Payer: Medicare Other

## 2020-04-16 ENCOUNTER — Inpatient Hospital Stay (HOSPITAL_COMMUNITY): Payer: Medicare Other

## 2020-04-16 DIAGNOSIS — G9341 Metabolic encephalopathy: Secondary | ICD-10-CM

## 2020-04-16 DIAGNOSIS — U071 COVID-19: Secondary | ICD-10-CM | POA: Diagnosis not present

## 2020-04-16 DIAGNOSIS — R4182 Altered mental status, unspecified: Secondary | ICD-10-CM

## 2020-04-16 LAB — CULTURE, BLOOD (ROUTINE X 2)
Culture: NO GROWTH
Culture: NO GROWTH

## 2020-04-16 LAB — CBC WITH DIFFERENTIAL/PLATELET
Abs Immature Granulocytes: 0.06 10*3/uL (ref 0.00–0.07)
Basophils Absolute: 0 10*3/uL (ref 0.0–0.1)
Basophils Relative: 0 %
Eosinophils Absolute: 0 10*3/uL (ref 0.0–0.5)
Eosinophils Relative: 0 %
HCT: 37.1 % (ref 36.0–46.0)
Hemoglobin: 11.6 g/dL — ABNORMAL LOW (ref 12.0–15.0)
Immature Granulocytes: 1 %
Lymphocytes Relative: 3 %
Lymphs Abs: 0.4 10*3/uL — ABNORMAL LOW (ref 0.7–4.0)
MCH: 26 pg (ref 26.0–34.0)
MCHC: 31.3 g/dL (ref 30.0–36.0)
MCV: 83.2 fL (ref 80.0–100.0)
Monocytes Absolute: 0.6 10*3/uL (ref 0.1–1.0)
Monocytes Relative: 5 %
Neutro Abs: 11.6 10*3/uL — ABNORMAL HIGH (ref 1.7–7.7)
Neutrophils Relative %: 91 %
Platelets: 351 10*3/uL (ref 150–400)
RBC: 4.46 MIL/uL (ref 3.87–5.11)
RDW: 19.1 % — ABNORMAL HIGH (ref 11.5–15.5)
WBC: 12.7 10*3/uL — ABNORMAL HIGH (ref 4.0–10.5)
nRBC: 0 % (ref 0.0–0.2)

## 2020-04-16 LAB — COMPREHENSIVE METABOLIC PANEL
ALT: 35 U/L (ref 0–44)
AST: 61 U/L — ABNORMAL HIGH (ref 15–41)
Albumin: 2.7 g/dL — ABNORMAL LOW (ref 3.5–5.0)
Alkaline Phosphatase: 116 U/L (ref 38–126)
Anion gap: 14 (ref 5–15)
BUN: 14 mg/dL (ref 8–23)
CO2: 22 mmol/L (ref 22–32)
Calcium: 9.2 mg/dL (ref 8.9–10.3)
Chloride: 104 mmol/L (ref 98–111)
Creatinine, Ser: 0.88 mg/dL (ref 0.44–1.00)
GFR, Estimated: >60 mL/min (ref >60)
Glucose, Bld: 108 mg/dL — ABNORMAL HIGH (ref 70–99)
Potassium: 3.2 mmol/L — ABNORMAL LOW (ref 3.5–5.1)
Sodium: 140 mmol/L (ref 135–145)
Total Bilirubin: 1 mg/dL (ref 0.3–1.2)
Total Protein: 7.9 g/dL (ref 6.5–8.1)

## 2020-04-16 LAB — D-DIMER, QUANTITATIVE: D-Dimer, Quant: 2.87 ug/mL-FEU — ABNORMAL HIGH (ref 0.00–0.50)

## 2020-04-16 LAB — C-REACTIVE PROTEIN: CRP: 4.2 mg/dL — ABNORMAL HIGH (ref <1.0)

## 2020-04-16 LAB — BRAIN NATRIURETIC PEPTIDE: B Natriuretic Peptide: 103.8 pg/mL — ABNORMAL HIGH (ref 0.0–100.0)

## 2020-04-16 LAB — MAGNESIUM: Magnesium: 1.9 mg/dL (ref 1.7–2.4)

## 2020-04-16 MED ORDER — POTASSIUM CHLORIDE CRYS ER 20 MEQ PO TBCR: 40.0000 meq | EXTENDED_RELEASE_TABLET | Freq: Once | ORAL | Status: DC

## 2020-04-16 MED ORDER — POTASSIUM CHLORIDE 10 MEQ/100ML IV SOLN
10.0000 meq | INTRAVENOUS | Status: AC
  Administered 2020-04-16 (×4): 10 meq via INTRAVENOUS
  Filled 2020-04-16 (×4): qty 100

## 2020-04-16 MED ORDER — ALPRAZOLAM 0.5 MG PO TABS: 0.5000 mg | ORAL_TABLET | Freq: Every day | ORAL | Status: DC

## 2020-04-16 NOTE — Progress Notes (Signed)
PROGRESS NOTE                                                                                                                                                                                                             Patient Demographics:    [name], is a 79 y.o. female, DOB - 23-Jan-1941, HGD:[id number]  Outpatient Primary MD for the patient is [name], FNP   Admit date - 04/13/2020   LOS - 2  Chief Complaint  Patient presents with  . Fall       Brief Narrative: Patient is a 79 y.o. 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  COVID-19 vaccinated status: Unvaccinated  Significant Events: 11/18>> Admit to [facility] for weakness/confusion/COVID-19 pneumonia  Significant studies: 11/18>> chest x-ray: Diffuse interstitial prominence throughout both lungs compatible with chronic lung disease/fibrosis-no definite airspace opacity 11/18>> pelvic x-ray: No acute bony abnormality. 11/18>> CT head/C-spine: No acute abnormality in head/no fractures in C-spine 11/19>> CT chest: Partially necrotic mass in the right lower lobe-fibrosis throughout both lungs.  CAD.  COVID-19 medications: Steroids: 11/18>> Remdesivir: 11/18>> Monoclonal antibody: 11/16 x 1  Antibiotics: None  Microbiology data: 11/20>> blood cultures: Ordered 11/18 >>blood culture: Gram-positive cocci (BC ID-staph species-could be a contaminant) 11/18>> urine culture: <10,007 colonies/mL-insignificant growth  Procedures: None  Consults: None  DVT prophylaxis: apixaban (ELIQUIS) tablet 2.5 mg Start: 04/13/20 2230 apixaban (ELIQUIS) tablet 2.5 mg    Subjective:   Patient  in bed appears to be in no distress but  currently confused, unable to answer questions or follow commands.   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  Stable, on 2 L nasal cannula oxygen which she wears at home as well.  Fever: afebrile O2 requirements:  SpO2: 94 % O2 Flow Rate (L/min): 2 L/min     Recent Labs  Lab 04/13/20 1855 04/14/20 0153 04/14/20 0446 04/14/20 1015 04/15/20 0123 04/16/20 0352  WBC 10.5  --  7.6  6.0 6.4 12.7*  HGB 8.6*  --  10.5* 11.7* 11.3* 11.6*  HCT 29.1*  --  34.8* 39.5 37.2 37.1  PLT 426*  --  299 291 329 351  CRP 12.6* 11.4*  --   --  7.6* 4.2*  BNP  --   --   --   --   --  103.8*  DDIMER 3.28* 2.40*  --   --  3.26* 2.87*  PROCALCITON <0.10  --   --   --   --   --   AST 34 222*  --   --  77* 61*  ALT 13 42  --   --  31 35  ALKPHOS 90 150*  --   --  131* 116  BILITOT 0.5 0.7  --   --  0.7 1.0  ALBUMIN 2.6* 2.6*  --   --  2.6* 2.7*     COPD exacerbation: Improving-continue steroids and bronchodilators.  On home nocturnal O2.  Gram-positive bacteremia: Reviewed BCID results-probably may be a contaminant-however has a Port-A-Cath-immunocompromised and getting active radiation treatment-repeat blood cultures-start IV vancomycin-await final culture results. -ve On 04/16/2020.  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  Nighttime Seroquel scheduled, Haldol as needed.  Await MRI and EEG.  Fall: Secondary debility/deconditioning at baseline-superimposed weakness due to COVID-19 infection.  Await PT/OT eval.  Anxiety/depression: Have restarted Xanax/Cymbalta-on Seroquel nightly starting today for severe delirium/encephalopathy.  Reassess tomorrow.    Sinus  tachycardia: Mild-fluctuating heart rate-mostly gets tachycardic when she is agitated.  Patient is pulling out her telemetry leads-and will not let nursing staff patient back again.  We will go and discontinue telemetry for now-may help keep her calm and manage her delirium/encephalopathy.  Chronic pain syndrome-opiate dependent: Apparently recently started on transdermal fentanyl-this remains on hold-given few doses of IV morphine yesterday without any improvement in her mental status.    Squamous cell carcinoma of the lung-getting radiation treatments-resume outpatient follow-up with radiation oncology/medical oncology-could have some amount of fibrosis/inflammation in the lungs due to radiation.  On steroids.  Per daughter-patient on radiation treatment as was deemed to be a poor candidate for chemotherapy due to overall medical comorbidities.  Normocytic anemia: Hemoglobin admission was probably a lab error-no evidence of any GI loss-repeat hemoglobin this morning back to baseline.  Follow CBC.   History of VTE: Continue Eliquis  HTN: BP controlled-continue amlodipine  HLD: Continue statin  GERD: Continue PPI  Goals of care: Long discussion with patient's daughter over the phone this morning-explained that work-up is in progress-to determine etiology of encephalopathy-COVID-19 infection continues to be treated with steroid/Remdesivir.  Explained that patient is not a good candidate for aggressive care given her above medical comorbidities.  Per patient's daughter-patient would not want to be kept alive with ventilator support or CPR.  She is agreeable for a DNR order.   GI prophylaxis: PPI   Condition - Stable  Family Communication  : Daughter [name] [phone]) updated over the phone on 04/16/20  Code Status :  Full Code  Diet :  Diet Order            Diet 2 gram sodium Room service appropriate? Yes; Fluid consistency: Thin  Diet effective now                   Disposition Plan  :   Status is: Inpatient  The patient will require care spanning > 2 midnights and should be  moved to inpatient because: Inpatient level of care appropriate due to severity of illness  Dispo: The patient is from: Home              Anticipated d/c is to: TBD              Anticipated d/c date is: 2 days              Patient currently is not medically stable to d/c.   Barriers to discharge: Hypoxia requiring O2 supplementation/complete 5 days of IV Remdesivir  Antimicorbials  :    Anti-infectives (From admission, onward)   Start     Dose/Rate Route Frequency Ordered Stop   04/16/20 2000  vancomycin (VANCOREADY) IVPB 750 mg/150 mL       "Followed by" Linked Group Details   750 mg 150 mL/hr over 60 Minutes Intravenous Every 24 hours 04/15/20 1647     04/16/20 1700  vancomycin (VANCOREADY) IVPB 750 mg/150 mL  Status:  Discontinued       "Followed by" Linked Group Details   750 mg 150 mL/hr over 60 Minutes Intravenous Every 24 hours 04/15/20 1403 04/15/20 1647   04/15/20 2000  vancomycin (VANCOCIN) IVPB 1000 mg/200 mL premix       "Followed by" Linked Group Details   1,000 mg 200 mL/hr over 60 Minutes Intravenous  Once 04/15/20 1647 04/16/20 0223   04/15/20 1700  vancomycin (VANCOCIN) IVPB 1000 mg/200 mL premix  Status:  Discontinued       "Followed by" Linked Group Details   1,000 mg 200 mL/hr over 60 Minutes Intravenous  Once 04/15/20 1403 04/15/20 1647   04/14/20 1000  remdesivir 100 mg in sodium chloride 0.9 % 100 mL IVPB       "Followed by" Linked Group Details   100 mg 200 mL/hr over 30 Minutes Intravenous Daily 04/13/20 2227 04/18/20 0959   04/13/20 2330  remdesivir 200 mg in sodium chloride 0.9% 250 mL IVPB       "Followed by" Linked Group Details   200 mg 580 mL/hr over 30 Minutes Intravenous Once 04/13/20 2227 04/14/20 0254      Inpatient Medications  Scheduled Meds: . ALPRAZolam  0.5 mg Oral QHS  . amLODipine  5 mg Oral Daily  . apixaban  2.5  mg Oral BID  . vitamin C  500 mg Oral Daily  . benzonatate  200 mg Oral TID  . dexamethasone (DECADRON) injection  6 mg Intravenous Q24H  . DULoxetine  60 mg Oral BH-q7a  . Ipratropium-Albuterol  2 puff Inhalation Q6H  . pantoprazole (PROTONIX) IV  40 mg Intravenous Q12H  . pravastatin  10 mg Oral q1800  . pregabalin  100 mg Oral BID  . QUEtiapine  25 mg Oral QHS  . zinc sulfate  220 mg Oral Daily   Continuous Infusions: . potassium chloride 10 mEq (04/16/20 0942)  . remdesivir 100 mg in NS 100 mL 100 mg (04/16/20 0830)  . vancomycin     PRN Meds:.acetaminophen, albuterol, chlorpheniramine-HYDROcodone, haloperidol lactate, LORazepam, [DISCONTINUED] ondansetron **OR** ondansetron (ZOFRAN) IV, oxyCODONE, polyethylene glycol   Time Spent in minutes  25  See all Orders from today for further details   Lala Lund M.D on 04/16/2020 at 10:05 AM  To page go to [email or web address] - use universal password  [facility] -  Office  [phone]    Objective:   Vitals:   04/15/20 0210 04/15/20 0547 04/15/20 2000 04/16/20 0453  BP: 117/78 134/84 (!) 152/99 (!) 139/99  Pulse: 99 98 (!) 101 (!) 104  Resp: 19 20 20 19   Temp: 98 F (36.7 C) 98.1 F (36.7 C) 98.4 F (36.9 C) 97.9 F (36.6 C)  TempSrc: Oral Oral Axillary Axillary  SpO2: 93% 92% 95% 94%  Weight:      Height:        Wt Readings from Last 3 Encounters:  04/13/20 68 kg     Intake/Output Summary (Last 24 hours) at 04/16/2020 1005 Last data filed at 04/16/2020 0454 Gross per 24 hour  Intake 762 ml  Output --  Net 762 ml     Physical Exam  Awake but confused, moving all 4 extremities  [place].AT,PERRAL Supple Neck,No JVD, No cervical lymphadenopathy appriciated.  Symmetrical Chest wall movement, Good air movement bilaterally, CTAB RRR,No Gallops, Rubs or new Murmurs, No Parasternal Heave +ve B.Sounds, Abd Soft, No tenderness, No organomegaly appriciated, No rebound - guarding or rigidity. No Cyanosis,  Clubbing or edema, No new Rash or bruise    Data Review:    CBC Recent Labs  Lab 04/13/20 1855 04/14/20 0446 04/14/20 1015 04/15/20 0123 04/16/20 0352  WBC 10.5 7.6 6.0 6.4 12.7*  HGB 8.6* 10.5* 11.7* 11.3* 11.6*  HCT 29.1* 34.8* 39.5 37.2 37.1  PLT 426* 299 291 329 351  MCV 88.2 84.7 85.9 83.4 83.2  MCH 26.1 25.5* 25.4* 25.3* 26.0  MCHC 29.6* 30.2 29.6* 30.4 31.3  RDW 18.5* 18.3* 18.8* 18.6* 19.1*  LYMPHSABS 1.2 1.0  --  0.3* 0.4*  MONOABS 1.2* 0.8  --  0.2 0.6  EOSABS 0.2 0.1  --  0.0 0.0  BASOSABS 0.0 0.0  --  0.0 0.0    Chemistries  Recent Labs  Lab 04/13/20 1855 04/14/20 0153 04/15/20 0123 04/16/20 0352  NA 137 139 141 140  K 3.8 4.3 3.7 3.2*  CL 104 103 107 104  CO2 22 23 22 22   GLUCOSE 127* 95 123* 108*  BUN 10 11 9 14   CREATININE 1.13* 1.11* 0.77 0.88  CALCIUM 9.5 9.3 9.6 9.2  MG  --  2.0 2.0 1.9  AST 34 222* 77* 61*  ALT 13 42 31 35  ALKPHOS 90 150* 131* 116  BILITOT 0.5 0.7 0.7 1.0   ------------------------------------------------------------------------------------------------------------------ Recent Labs    04/13/20 1855  TRIG 111    No results found for: HGBA1C ------------------------------------------------------------------------------------------------------------------ No results for input(s): TSH, T4TOTAL, T3FREE, THYROIDAB in the last 72 hours.  Invalid input(s): FREET3 ------------------------------------------------------------------------------------------------------------------ Recent Labs    04/13/20 1855  FERRITIN 32    Coagulation profile No results for input(s): INR, PROTIME in the last 168 hours.  Recent Labs    04/15/20 0123 04/16/20 0352  DDIMER 3.26* 2.87*    Cardiac Enzymes No results for input(s): CKMB, TROPONINI, MYOGLOBIN in the last 168 hours.  Invalid input(s): CK ------------------------------------------------------------------------------------------------------------------    Component  Value Date/Time   BNP 103.8 (H) 04/16/2020 0352    Micro Results Recent Results (from the past 240 hour(s))  Culture, Urine     Status: Abnormal   Collection Time: 04/13/20  6:59 AM   Specimen: Urine, Random  Result Value Ref Range Status   Specimen Description URINE, RANDOM  Final   Special Requests NONE  Final   Culture (A)  Final    <10,000 COLONIES/mL INSIGNIFICANT GROWTH Performed at [facility], [address], [place], [place] [address]    Report Status 04/15/2020 FINAL  Final  Blood Culture (routine x 2)     Status: Abnormal  Collection Time: 04/13/20  7:00 PM   Specimen: BLOOD  Result Value Ref Range Status   Specimen Description BLOOD SITE NOT SPECIFIED  Final   Special Requests   Final    BOTTLES DRAWN AEROBIC ONLY Blood Culture results may not be optimal due to an inadequate volume of blood received in culture bottles   Culture  Setup Time   Final    GRAM POSITIVE COCCI AEROBIC BOTTLE ONLY Organism ID to follow CRITICAL RESULT CALLED TO, READ BACK BY AND VERIFIED WITH: PHARMD M Lewis 04/14/20 AT 1853 SK    Culture (A)  Final    STAPHYLOCOCCUS HOMINIS THE SIGNIFICANCE OF ISOLATING THIS ORGANISM FROM A SINGLE SET OF BLOOD CULTURES WHEN MULTIPLE SETS ARE DRAWN IS UNCERTAIN. PLEASE NOTIFY THE MICROBIOLOGY DEPARTMENT WITHIN ONE WEEK IF SPECIATION AND SENSITIVITIES ARE REQUIRED. Performed at [facility], [place] [address] [place], [place] [address]    Report Status 04/15/2020 FINAL  Final  Blood Culture ID Panel (Reflexed)     Status: Abnormal   Collection Time: 04/13/20  7:00 PM  Result Value Ref Range Status   Enterococcus faecalis NOT DETECTED NOT DETECTED Final   Enterococcus Faecium NOT DETECTED NOT DETECTED Final   Listeria monocytogenes NOT DETECTED NOT DETECTED Final   Staphylococcus species DETECTED (A) NOT DETECTED Final    Comment: CRITICAL RESULT CALLED TO, READ BACK BY AND VERIFIED WITH: PHARMD M Barneveld 04/14/20 AT 1853 SK     Staphylococcus aureus (BCID) NOT DETECTED NOT DETECTED Final   Staphylococcus epidermidis NOT DETECTED NOT DETECTED Final   Staphylococcus lugdunensis NOT DETECTED NOT DETECTED Final   Streptococcus species NOT DETECTED NOT DETECTED Final   Streptococcus agalactiae NOT DETECTED NOT DETECTED Final   Streptococcus pneumoniae NOT DETECTED NOT DETECTED Final   Streptococcus pyogenes NOT DETECTED NOT DETECTED Final   A.calcoaceticus-baumannii NOT DETECTED NOT DETECTED Final   Bacteroides fragilis NOT DETECTED NOT DETECTED Final   Enterobacterales NOT DETECTED NOT DETECTED Final   Enterobacter cloacae complex NOT DETECTED NOT DETECTED Final   Escherichia coli NOT DETECTED NOT DETECTED Final   Klebsiella aerogenes NOT DETECTED NOT DETECTED Final   Klebsiella oxytoca NOT DETECTED NOT DETECTED Final   Klebsiella pneumoniae NOT DETECTED NOT DETECTED Final   Proteus species NOT DETECTED NOT DETECTED Final   Salmonella species NOT DETECTED NOT DETECTED Final   Serratia marcescens NOT DETECTED NOT DETECTED Final   Haemophilus influenzae NOT DETECTED NOT DETECTED Final   Neisseria meningitidis NOT DETECTED NOT DETECTED Final   Pseudomonas aeruginosa NOT DETECTED NOT DETECTED Final   Stenotrophomonas maltophilia NOT DETECTED NOT DETECTED Final   Candida albicans NOT DETECTED NOT DETECTED Final   Candida auris NOT DETECTED NOT DETECTED Final   Candida glabrata NOT DETECTED NOT DETECTED Final   Candida krusei NOT DETECTED NOT DETECTED Final   Candida parapsilosis NOT DETECTED NOT DETECTED Final   Candida tropicalis NOT DETECTED NOT DETECTED Final   Cryptococcus neoformans/gattii NOT DETECTED NOT DETECTED Final    Comment: Performed at [facility], [address] [place],  [place] [address]    Radiology Reports CT Head Wo Contrast  Result Date: 04/13/2020 CLINICAL DATA:  79 year old female with head trauma. EXAM: CT HEAD WITHOUT CONTRAST CT CERVICAL SPINE WITHOUT CONTRAST TECHNIQUE:  Multidetector CT imaging of the head and cervical spine was performed following the standard protocol without intravenous contrast. Multiplanar CT image reconstructions of the cervical spine were also generated. COMPARISON:  CT dated 08/28/2019. FINDINGS: CT HEAD FINDINGS Brain:  Mild age-related atrophy and chronic microvascular ischemic changes. There is no acute intracranial hemorrhage. No mass effect or midline shift. No extra-axial fluid collection. Vascular: No hyperdense vessel or unexpected calcification. Skull: Normal. Negative for fracture or focal lesion. Sinuses/Orbits: No acute finding. Other: None CT CERVICAL SPINE FINDINGS Alignment: No acute subluxation. Skull base and vertebrae: No acute fracture. Osteopenia. Soft tissues and spinal canal: No prevertebral fluid or swelling. No visible canal hematoma. Disc levels:  Degenerative changes. No acute findings. Upper chest: Small left upper lobe calcified granuloma. Partially visualized right subclavian central venous line. Other: Bilateral carotid bulb calcified plaques. IMPRESSION: 1. No acute intracranial pathology. Mild age-related atrophy and chronic microvascular ischemic changes. 2. No acute/traumatic cervical spine pathology. Electronically Signed   By: Anner Crete M.D.   On: 04/13/2020 20:44   CT CHEST WO CONTRAST  Result Date: 04/14/2020 CLINICAL DATA:  Respiratory illness EXAM: CT CHEST WITHOUT CONTRAST TECHNIQUE: Multidetector CT imaging of the chest was performed following the standard protocol without IV contrast. COMPARISON:  Chest x-ray 04/13/2020.  CT 04/11/2020 FINDINGS: Cardiovascular: Aortic atherosclerosis. Scattered coronary artery calcifications. Heart is normal size and aorta is normal caliber. Mediastinum/Nodes: Small scattered non pathologically enlarged mediastinal lymph nodes, stable. No mediastinal, hilar, or axillary adenopathy. Trachea and esophagus are unremarkable. Thyroid unremarkable. Lungs/Pleura: Peripheral  interstitial prominence in thickening compatible with fibrosis, stable. Rounded partially necrotic mass in the posterior right lower lobe measures 3.1 cm and is slightly decreased in size since prior study when this measured 3.8 cm. No effusions. No confluent opacities on the left. Calcified granuloma in the left upper lobe. Upper Abdomen: Imaging into the upper abdomen demonstrates no acute findings. Musculoskeletal: Chest wall soft tissues are unremarkable. Right upper chest wall Port-A-Cath remains in place, unchanged. No acute bony abnormality. Old posterior right 7th rib fracture and upper thoracic compression fractures, unchanged. IMPRESSION: Partially necrotic mass in the posterior right lower lobe is again noted, slightly decreased in size since prior study, 3.1 cm currently compared to 3.8 cm previously. Changes of fibrosis throughout the lungs. Coronary artery disease. Aortic Atherosclerosis (ICD10-I70.0). Electronically Signed   By: Rolm Baptise M.D.   On: 04/14/2020 18:33   CT Cervical Spine Wo Contrast  Result Date: 04/13/2020 CLINICAL DATA:  79 year old female with head trauma. EXAM: CT HEAD WITHOUT CONTRAST CT CERVICAL SPINE WITHOUT CONTRAST TECHNIQUE: Multidetector CT imaging of the head and cervical spine was performed following the standard protocol without intravenous contrast. Multiplanar CT image reconstructions of the cervical spine were also generated. COMPARISON:  CT dated 08/28/2019. FINDINGS: CT HEAD FINDINGS Brain: Mild age-related atrophy and chronic microvascular ischemic changes. There is no acute intracranial hemorrhage. No mass effect or midline shift. No extra-axial fluid collection. Vascular: No hyperdense vessel or unexpected calcification. Skull: Normal. Negative for fracture or focal lesion. Sinuses/Orbits: No acute finding. Other: None CT CERVICAL SPINE FINDINGS Alignment: No acute subluxation. Skull base and vertebrae: No acute fracture. Osteopenia. Soft tissues and spinal  canal: No prevertebral fluid or swelling. No visible canal hematoma. Disc levels:  Degenerative changes. No acute findings. Upper chest: Small left upper lobe calcified granuloma. Partially visualized right subclavian central venous line. Other: Bilateral carotid bulb calcified plaques. IMPRESSION: 1. No acute intracranial pathology. Mild age-related atrophy and chronic microvascular ischemic changes. 2. No acute/traumatic cervical spine pathology. Electronically Signed   By: Anner Crete M.D.   On: 04/13/2020 20:44   DG Pelvis Portable  Result Date: 04/13/2020 CLINICAL DATA:  Fall EXAM: PORTABLE PELVIS 1-2 VIEWS COMPARISON:  None. FINDINGS: Prior right hip replacement. Normal AP alignment. Mild degenerative changes within the left hip. SI joints symmetric and unremarkable. No acute bony abnormality. Specifically, no fracture, subluxation, or dislocation. IMPRESSION: No acute bony abnormality. Electronically Signed   By: Rolm Baptise M.D.   On: 04/13/2020 19:21   DG Chest Port 1 View  Result Date: 04/13/2020 CLINICAL DATA:  Fall, COVID EXAM: PORTABLE CHEST 1 VIEW COMPARISON:  04/11/2020 FINDINGS: Right Port-A-Cath remains in place, unchanged. Heart is normal size. Low lung volumes. Diffuse interstitial prominence throughout the lungs compatible with chronic lung disease/fibrosis. No definite acute confluent airspace opacity. Previously seen rounded mass posteriorly in the right lung better seen on prior CT. No effusions. IMPRESSION: Stable appearance of the chest. Electronically Signed   By: Rolm Baptise M.D.   On: 04/13/2020 19:21

## 2020-04-16 NOTE — Procedures (Signed)
Patient Name: LITITIA SEN  MRN: [id number]  Epilepsy Attending: Lora Havens  Referring Physician/Provider: Dr Oren Binet Date: 04/16/2020 Duration: 25.12 mins  Patient history: 79yo F with ams. EEG to evaluate for seizure.  Level of alertness: Awake  AEDs during EEG study: xanax, pregabalin  Technical aspects: This EEG study was done with scalp electrodes positioned according to the 10-20 International system of electrode placement. Electrical activity was acquired at a sampling rate of 500Hz  and reviewed with a high frequency filter of 70Hz  and a low frequency filter of 1Hz . EEG data were recorded continuously and digitally stored.   Description: The posterior dominant rhythm consists of 8 Hz activity of moderate voltage (25-35 uV) seen predominantly in posterior head regions, symmetric and reactive to eye opening and eye closing.  EEG showed intermittent generalized 3 to 6 Hz theta-delta slowing. Triphasic waves, generalized were also noted. Hyperventilation and photic stimulation were not performed.    ABNORMALITY -Intermittent slow, generalized -Triphasic waves, generalized  IMPRESSION: This study is suggestive of mild diffuse encephalopathy, nonspecific etiology but likely related to toxic-metabolic etiology, anoxic/hypoxic brain injury. No seizures or epileptiform discharges were seen throughout the recording.  Doriana Mazurkiewicz Barbra Sarks

## 2020-04-16 NOTE — Progress Notes (Signed)
Dear Doctor: Sloan Leiter** This patient has been identified as a candidate for PICC for the following reason (s): drug pH or osmolality (causing phlebitis, infiltration in 24 hours) and poor veins/poor circulatory system (CHF, COPD, emphysema, diabetes, steroid use, IV drug abuse, etc.) If you agree, please write an order for the indicated device. For any questions contact the Vascular Access Team at [phone] if no answer, please leave a message.  Thank you for supporting the early vascular access assessment program.

## 2020-04-16 NOTE — Progress Notes (Signed)
EEG complete - results pending 

## 2020-04-16 NOTE — Evaluation (Signed)
Physical Therapy Evaluation Patient Details Name: [name] MRN: [id number] DOB: [birth year] Today's Date: 04/16/2020   History of Present Illness  79 yo female presenting with fall, generalized weakness, and confusion. Diagnosed with COVID-19 pneumonia on 11/16. Unvaccinated. 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  Clinical Impression  Evaluation limited by pt ability to participate due to confusion. Pt reports living with husband in single story home, this is different from report to OT yesterday. Pt answers 2/10 questions and follows approx 25% of commands for movement with increased encouragement. Pt appears to be able to move LE without difficulty. RN request pt to remain in bed for safety and to continue IV infusion of potassium. PT recommending SNF level rehab for safety in returning to PLOF. PT will continue to follow acutely.    Follow Up Recommendations SNF    Equipment Recommendations  Other (comment) (TBD at next venue)       Precautions / Restrictions Precautions Precautions: Fall Precaution Comments: Agitation and confusion Restrictions Weight Bearing Restrictions: No      Mobility  Bed Mobility Overal bed mobility: Needs Assistance             General bed mobility comments: Pt able to grab bed rails and then pull into long sitting and scoot her bottom back in bed     Transfers                 General transfer comment: Defer RN request due to need to finish run of K+              Pertinent Vitals/Pain Pain Assessment: No/denies pain    Home Living Family/patient expects to be discharged to:: Private residence                 Additional Comments: Per chart review, pt from home. Pt reports she lives with husband in single story home contradicting reports to OT    Prior Function           Comments: Pt unable to  provide information due to confusion     Hand Dominance   Dominant Hand: Right    Extremity/Trunk Assessment        Lower Extremity Assessment Lower Extremity Assessment: Generalized weakness;Difficult to assess due to impaired cognition;RLE deficits/detail;LLE deficits/detail RLE Deficits / Details: TKA, ROM in moving in bed WFL LLE Deficits / Details: TKA, ROM in moving in bed [facility]       Communication   Communication: No difficulties  Cognition Arousal/Alertness: Awake/alert Behavior During Therapy: Impulsive;Restless Overall Cognitive Status: No family/caregiver present to determine baseline cognitive functioning                                 General Comments: Pt continues to very confused answering 2/10 questions and following 25% of commands for mobility      General Comments General comments (skin integrity, edema, etc.): VSS on RA        Assessment/Plan    PT Assessment Patient needs continued PT services  PT Problem List Decreased cognition;Decreased activity tolerance;Cardiopulmonary status limiting activity       PT Treatment Interventions DME instruction;Gait training;Stair training;Functional mobility training;Therapeutic activities;Therapeutic exercise;Balance training;Cognitive remediation;Patient/family education    PT Goals (Current goals can be found in the Care Plan section)  Acute Rehab PT Goals  Patient Stated Goal: "I need to leave" PT Goal Formulation: Patient unable to participate in goal setting Time For Goal Achievement: 04/30/20 Potential to Achieve Goals: Fair    Frequency Min 3X/week    AM-PAC PT "6 Clicks" Mobility  Outcome Measure Help needed turning from your back to your side while in a flat bed without using bedrails?: None Help needed moving from lying on your back to sitting on the side of a flat bed without using bedrails?: None Help needed moving to and from a bed to a chair (including a wheelchair)?: A  Little Help needed standing up from a chair using your arms (e.g., wheelchair or bedside chair)?: A Lot Help needed to walk in hospital room?: A Lot Help needed climbing 3-5 steps with a railing? : A Lot 6 Click Score: 17    End of Session   Activity Tolerance: Treatment limited secondary to agitation Patient left: in bed;with call bell/phone within reach;with restraints reapplied;with nursing/sitter in room;with bed alarm set Nurse Communication: Mobility status PT Visit Diagnosis: Other abnormalities of gait and mobility (R26.89);Muscle weakness (generalized) (M62.81);Difficulty in walking, not elsewhere classified (R26.2)    Time: 5784-6962 PT Time Calculation (min) (ACUTE ONLY): 17 min   Charges:   PT Evaluation $PT Eval Moderate Complexity: 1 Mod          Patriciaann Rabanal B. Beverely Risen PT, DPT Acute Rehabilitation Services Pager [phone] Office [phone]   Elon Alas Fleet 04/16/2020, 4:28 PM

## 2020-04-17 ENCOUNTER — Inpatient Hospital Stay: Payer: Medicare Other

## 2020-04-17 ENCOUNTER — Other Ambulatory Visit: Payer: Self-pay

## 2020-04-17 ENCOUNTER — Ambulatory Visit: Payer: Medicare Other

## 2020-04-17 ENCOUNTER — Inpatient Hospital Stay (HOSPITAL_COMMUNITY): Payer: Medicare Other

## 2020-04-17 DIAGNOSIS — U071 COVID-19: Secondary | ICD-10-CM | POA: Diagnosis not present

## 2020-04-17 LAB — CBC WITH DIFFERENTIAL/PLATELET
Abs Immature Granulocytes: 0.04 10*3/uL (ref 0.00–0.07)
Basophils Absolute: 0 10*3/uL (ref 0.0–0.1)
Basophils Relative: 0 %
Eosinophils Absolute: 0 10*3/uL (ref 0.0–0.5)
Eosinophils Relative: 0 %
HCT: 44.1 % (ref 36.0–46.0)
Hemoglobin: 13.5 g/dL (ref 12.0–15.0)
Immature Granulocytes: 0 %
Lymphocytes Relative: 5 %
Lymphs Abs: 0.5 10*3/uL — ABNORMAL LOW (ref 0.7–4.0)
MCH: 25.4 pg — ABNORMAL LOW (ref 26.0–34.0)
MCHC: 30.6 g/dL (ref 30.0–36.0)
MCV: 82.9 fL (ref 80.0–100.0)
Monocytes Absolute: 0.5 10*3/uL (ref 0.1–1.0)
Monocytes Relative: 6 %
Neutro Abs: 8.7 10*3/uL — ABNORMAL HIGH (ref 1.7–7.7)
Neutrophils Relative %: 89 %
Platelets: 444 10*3/uL — ABNORMAL HIGH (ref 150–400)
RBC: 5.32 MIL/uL — ABNORMAL HIGH (ref 3.87–5.11)
RDW: 19.3 % — ABNORMAL HIGH (ref 11.5–15.5)
WBC: 9.8 10*3/uL (ref 4.0–10.5)
nRBC: 0 % (ref 0.0–0.2)

## 2020-04-17 LAB — C-REACTIVE PROTEIN: CRP: 3.1 mg/dL — ABNORMAL HIGH (ref <1.0)

## 2020-04-17 LAB — COMPREHENSIVE METABOLIC PANEL
ALT: 37 U/L (ref 0–44)
AST: 45 U/L — ABNORMAL HIGH (ref 15–41)
Albumin: 2.9 g/dL — ABNORMAL LOW (ref 3.5–5.0)
Alkaline Phosphatase: 119 U/L (ref 38–126)
Anion gap: 14 (ref 5–15)
BUN: 21 mg/dL (ref 8–23)
CO2: 21 mmol/L — ABNORMAL LOW (ref 22–32)
Calcium: 9.9 mg/dL (ref 8.9–10.3)
Chloride: 105 mmol/L (ref 98–111)
Creatinine, Ser: 1.02 mg/dL — ABNORMAL HIGH (ref 0.44–1.00)
GFR, Estimated: 56 mL/min — ABNORMAL LOW (ref >60)
Glucose, Bld: 99 mg/dL (ref 70–99)
Potassium: 3.5 mmol/L (ref 3.5–5.1)
Sodium: 140 mmol/L (ref 135–145)
Total Bilirubin: 1.1 mg/dL (ref 0.3–1.2)
Total Protein: 9.2 g/dL — ABNORMAL HIGH (ref 6.5–8.1)

## 2020-04-17 LAB — URIC ACID: Uric Acid, Serum: 6.9 mg/dL (ref 2.5–7.1)

## 2020-04-17 LAB — BRAIN NATRIURETIC PEPTIDE: B Natriuretic Peptide: 80.4 pg/mL (ref 0.0–100.0)

## 2020-04-17 LAB — MAGNESIUM: Magnesium: 2 mg/dL (ref 1.7–2.4)

## 2020-04-17 LAB — PROCALCITONIN: Procalcitonin: <0.1 ng/mL

## 2020-04-17 LAB — D-DIMER, QUANTITATIVE: D-Dimer, Quant: 2.87 ug/mL-FEU — ABNORMAL HIGH (ref 0.00–0.50)

## 2020-04-17 MED ORDER — NITROGLYCERIN 2 % TD OINT
1.0000 [in_us] | TOPICAL_OINTMENT | Freq: Four times a day (QID) | TRANSDERMAL | Status: DC
  Administered 2020-04-17 – 2020-04-23 (×24): 1 [in_us] via TOPICAL
  Filled 2020-04-17: qty 30

## 2020-04-17 MED ORDER — QUETIAPINE FUMARATE 25 MG PO TABS: 12.5000 mg | ORAL_TABLET | Freq: Every day | ORAL | Status: DC

## 2020-04-17 MED ORDER — SODIUM CHLORIDE 0.9 % IV SOLN
1.0000 g | INTRAVENOUS | Status: AC
  Administered 2020-04-17 – 2020-04-19 (×3): 1 g via INTRAVENOUS
  Filled 2020-04-17 (×3): qty 10

## 2020-04-17 MED ORDER — LACTATED RINGERS IV SOLN: INTRAVENOUS | Status: DC

## 2020-04-17 MED ORDER — HYDRALAZINE HCL 20 MG/ML IJ SOLN: 10.0000 mg | Freq: Four times a day (QID) | INTRAMUSCULAR | Status: DC | PRN

## 2020-04-17 NOTE — Progress Notes (Signed)
Physical Therapy Treatment Patient Details Name: [name] MRN: [id number] DOB: 13-Jul-1940 Today's Date: 04/17/2020    History of Present Illness Pt is a 79 y.o. female dx with COVID-19 on 04/11/20, now admitted 04/13/20 with fall, generalized weakness and confusion. Workup for acute on chronic hypoxic respiratory failure due to COVID PNA, acute toxic encephalopathy. Chest CT with partially necrotic mass in RLL, fibrosis throughout lungs. Head MRI negative for acute abnormality. EEG suggestive of mild diffuse encephalopathy, nonspecific etiology but likely related to toxic-metabolic etiology, anoxic/hypoxic brain injury. 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    PT Comments    Prior to session, called pt's daughter to obtain her prior functional status (ambulated with rollator with supervision; bathed/dressed with supervision). Patient awake and pleasantly confused. Able to follow commands except with RLE which she cries out in pain with any attempts to touch or move RLE. No bruises noted. Pt fell PTA and noted pelvic xrays negative. Notified MD of patient's severe pain (crying with gentle attempts to move RLE towards EOB). Able to complete exercises with LLE.     Follow Up Recommendations  Home health PT;Supervision/Assistance - 24 hour (daughter refusing SNF; has cared for pt at home)     Equipment Recommendations  Other (comment) (?wheelchair, although ?home accessible)    Recommendations for Other Services       Precautions / Restrictions Precautions Precautions: Fall Precaution Comments: Agitation and confusion    Mobility  Bed Mobility Overal bed mobility: Needs Assistance             General bed mobility comments: attempted sit EOB, however pt began crying with attempts to move RLE (persisted even when pt distracted and talking about her dogs)  Transfers                 General transfer comment: unable  due to RLE pain  Ambulation/Gait                 Stairs             Wheelchair Mobility    Modified Rankin (Stroke Patients Only)       Balance       Sitting balance - Comments: pulls into unsupported sitting in chair position in bed (no use of UEs)                                    Cognition Arousal/Alertness: Awake/alert Behavior During Therapy: WFL for tasks assessed/performed Overall Cognitive Status: No family/caregiver present to determine baseline cognitive functioning                                 General Comments: pt following 1 step commands 100% of time except when asked to move RLE (cries out in pain if touched)      Exercises Other Exercises Other Exercises: Pt participated in AAROM-AROM LLE (ankle pumps, heelslides, hip abdct) however resists any movement of RLE and cries out with the slightest light touch    General Comments General comments (skin integrity, edema, etc.): Sitter present throughout. Noted pelvic xrays negative on admission      Pertinent Vitals/Pain Pain Assessment: Faces Faces Pain Scale: Hurts worst Pain Location: rt foot and leg Pain Descriptors / Indicators: Guarding;Grimacing;Crying Pain Intervention(s): Limited activity within patient's tolerance;Monitored during session  Home Living Family/patient expects to be discharged to:: Private residence Living Arrangements: Children ([name]) Available Help at Discharge: Family Type of Home: House Home Access: Stairs to enter Entrance Stairs-Rails: [place]: One level Home Equipment: Environmental consultant - 4 wheels;Tub bench;Toilet riser      Prior Function Level of Independence: Needs assistance  Gait / Transfers Assistance Needed: Walks with rollator with supervision; up/down steps with minguard assist ADL's / Homemaking Assistance Needed: typically sits on tub bench, swings legs over EOb and sits to shower (does with  supervision); sometimes does bath at sink; dresses herself     PT Goals (current goals can now be found in the care plan section) Acute Rehab PT Goals Patient Stated Goal: "I need to leave" Time For Goal Achievement: 04/30/20 Potential to Achieve Goals: Fair Progress towards PT goals: Progressing toward goals    Frequency    Min 3X/week      PT Plan Discharge plan needs to be updated    Co-evaluation              AM-PAC PT "6 Clicks" Mobility   Outcome Measure  Help needed turning from your back to your side while in a flat bed without using bedrails?: None Help needed moving from lying on your back to sitting on the side of a flat bed without using bedrails?: None Help needed moving to and from a bed to a chair (including a wheelchair)?: Total Help needed standing up from a chair using your arms (e.g., wheelchair or bedside chair)?: Total Help needed to walk in hospital room?: Total Help needed climbing 3-5 steps with a railing? : Total 6 Click Score: 12    End of Session   Activity Tolerance: Patient limited by pain Patient left: in bed;with call bell/phone within reach;with restraints reapplied;with nursing/sitter in room;with bed alarm set (waist, wrist, mitts) Nurse Communication: Mobility status;Other (comment) (pain in RLE with any movement) PT Visit Diagnosis: Other abnormalities of gait and mobility (R26.89);Muscle weakness (generalized) (M62.81);Difficulty in walking, not elsewhere classified (R26.2)     Time: 6153-7943 PT Time Calculation (min) (ACUTE ONLY): 25 min  Charges:  $Therapeutic Exercise: 23-37 mins                      Arby Barrette, PT Pager [phone]    Rexanne Mano 04/17/2020, 4:35 PM

## 2020-04-17 NOTE — TOC CAGE-AID Note (Signed)
Transition of Care [facility]) - CAGE-AID Screening   Patient Details  Name: [name] MRN: [id number] Date of Birth: 03-25-1941  [facility]) CM/SW Contact:    Emeterio Reeve, [place] Phone Number: 04/17/2020, 9:43 AM   Clinical Narrative:  Pt was unable to participate in assessment due to only being oriented to person.  CAGE-AID Screening: Substance Abuse Screening unable to be completed due to: : Patient unable to participate            Providence Crosby Clinical Social Worker [phone]

## 2020-04-17 NOTE — Progress Notes (Signed)
PROGRESS NOTE                                                                                                                                                                                                             Patient Demographics:    [name], is a 79 y.o. female, DOB - Feb 21, 1941, NTI:[id number]  Outpatient Primary MD for the patient is [name], FNP   Admit date - 04/13/2020   LOS - 3  Chief Complaint  Patient presents with  . Fall       Brief Narrative: Patient is a 79 y.o. 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  COVID-19 vaccinated status: Unvaccinated  Significant Events: 11/18>> Admit to [facility] for weakness/confusion/COVID-19 pneumonia  Significant studies: 11/18>> chest x-ray: Diffuse interstitial prominence throughout both lungs compatible with chronic lung disease/fibrosis-no definite airspace opacity 11/18>> pelvic x-ray: No acute bony abnormality. 11/18>> CT head/C-spine: No acute abnormality in head/no fractures in C-spine 11/19>> CT chest: Partially necrotic mass in the right lower lobe-fibrosis throughout both lungs.  CAD. 11/21>> EEG - This study is suggestive of mild diffuse encephalopathy, nonspecific etiology but likely related to toxic-metabolic etiology, anoxic/hypoxic brain injury. No seizures or epileptiform discharges were seen throughout the recording. 11/22 >> MRI  COVID-19 medications: Steroids: 11/18>> Remdesivir: 11/18>> Monoclonal antibody: 11/16 x 1  Antibiotics: None  Microbiology data: 11/20>> blood cultures: Ordered 11/18 >>blood culture: Gram-positive cocci (BC ID-staph species-could be a contaminant) 11/18>> urine culture: <10,007  colonies/mL-insignificant growth  Procedures: None  Consults: None  DVT prophylaxis: apixaban (ELIQUIS) tablet 2.5 mg Start: 04/13/20 2230 apixaban (ELIQUIS) tablet 2.5 mg    Subjective:   Patient in bed, slightly more somnolent on 04/17/2020, unable to answer questions or follow commands reliably.  But in no distress.   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  Stable, on 2 L nasal cannula oxygen which she wears at home as well.  Fever: afebrile O2 requirements:  SpO2: 95 % O2 Flow Rate (L/min): 2 L/min     Recent Labs  Lab 04/13/20 1855 04/13/20 1855 04/14/20 0153 04/14/20 0446 04/14/20 1015 04/15/20 0123 04/16/20 0352 04/17/20 0830  WBC 10.5   < >  --  7.6 6.0 6.4 12.7* 9.8  HGB 8.6*   < >  --  10.5* 11.7* 11.3* 11.6* 13.5  HCT 29.1*   < >  --  34.8* 39.5 37.2 37.1 44.1  PLT 426*   < >  --  299 291 329 351 444*  CRP 12.6*  --  11.4*  --   --  7.6* 4.2* 3.1*  BNP  --   --   --   --   --   --  103.8* 80.4  DDIMER 3.28*  --  2.40*  --   --  3.26* 2.87* 2.87*  PROCALCITON <0.10  --   --   --   --   --   --   --   AST 34  --  222*  --   --  77* 61* 45*  ALT 13  --  42  --   --  31 35 37  ALKPHOS 90  --  150*  --   --  131* 116 119  BILITOT 0.5  --  0.7  --   --  0.7 1.0 1.1  ALBUMIN 2.6*  --  2.6*  --   --  2.6* 2.7* 2.9*   < > = values in this interval not displayed.     COPD exacerbation: Improving-continue steroids and bronchodilators.  On home nocturnal O2.  Gram-positive bacteremia: Reviewed BCID results-probably may be a contaminant-however has a Port-A-Cath-immunocompromised and getting active radiation treatment-repeat blood cultures-start IV vancomycin-await final culture results. -ve On 04/17/2020, trend  Procal.  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 EEG non acute,  Hold Seroquel as more sleepy now, Haldol as needed.  Await MRI.  Fall: Secondary debility/deconditioning at baseline-superimposed weakness due to COVID-19 infection.  Await PT/OT eval.  Anxiety/depression: Have restarted Xanax/Cymbalta-on Seroquel nightly starting today for severe delirium/encephalopathy.  Reassess tomorrow.    Sinus tachycardia: Mild-fluctuating heart rate-mostly gets tachycardic when she is agitated.  Patient is pulling out her telemetry leads-and will not let nursing staff patient back again.  We will go and discontinue telemetry for now-may help keep her calm and manage her delirium/encephalopathy.  Chronic pain syndrome-opiate dependent: Apparently recently started on transdermal fentanyl-this remains on hold-given few doses of IV morphine yesterday without any improvement in her mental status.    Squamous cell carcinoma of the lung-getting radiation treatments-resume outpatient follow-up with radiation oncology/medical oncology-could have some amount of fibrosis/inflammation in the lungs due to radiation.  On steroids.  Per daughter-patient on radiation treatment as was deemed to be a poor candidate for chemotherapy due to overall medical comorbidities.  Normocytic anemia: Hemoglobin admission was probably a lab error-no evidence of any GI loss-repeat hemoglobin this morning back to baseline.  Follow CBC.   History of VTE: Continue Eliquis  HTN: BP high, unable to take oral medications consistently, added Nitropaste and as needed hydralazine.  Monitor.  HLD: Continue statin  GERD: Continue PPI  Goals of care: Long discussion with patient's daughter over the phone this morning-explained that work-up is in progress-to determine etiology of encephalopathy-COVID-19 infection continues to be treated with  steroid/Remdesivir.  Explained that patient is not a good candidate for aggressive care given her above medical  comorbidities.  Per patient's daughter-patient would not want to be kept alive with ventilator support or CPR.  She is agreeable for a DNR order.   GI prophylaxis: PPI   Condition - Stable  Family Communication  : Daughter [name] [phone]) updated over the phone on 04/16/20, 04/17/20  Code Status :  Full Code  Diet :  Diet Order            Diet 2 gram sodium Room service appropriate? Yes; Fluid consistency: Thin  Diet effective now                  Disposition Plan  :  Status is: Inpatient  The patient will require care spanning > 2 midnights and should be moved to inpatient because: Inpatient level of care appropriate due to severity of illness  Dispo: The patient is from: Home              Anticipated d/c is to: TBD              Anticipated d/c date is: 2 days              Patient currently is not medically stable to d/c.   Barriers to discharge: Hypoxia requiring O2 supplementation/complete 5 days of IV Remdesivir  Antimicorbials  :    Anti-infectives (From admission, onward)   Start     Dose/Rate Route Frequency Ordered Stop   04/17/20 1130  cefTRIAXone (ROCEPHIN) 1 g in sodium chloride 0.9 % 100 mL IVPB        1 g 200 mL/hr over 30 Minutes Intravenous Every 24 hours 04/17/20 1039 04/20/20 1129   04/16/20 2000  vancomycin (VANCOREADY) IVPB 750 mg/150 mL       "Followed by" Linked Group Details   750 mg 150 mL/hr over 60 Minutes Intravenous Every 24 hours 04/15/20 1647     04/16/20 1700  vancomycin (VANCOREADY) IVPB 750 mg/150 mL  Status:  Discontinued       "Followed by" Linked Group Details   750 mg 150 mL/hr over 60 Minutes Intravenous Every 24 hours 04/15/20 1403 04/15/20 1647   04/15/20 2000  vancomycin (VANCOCIN) IVPB 1000 mg/200 mL premix       "Followed by" Linked Group Details   1,000 mg 200 mL/hr over 60 Minutes Intravenous  Once  04/15/20 1647 04/16/20 0223   04/15/20 1700  vancomycin (VANCOCIN) IVPB 1000 mg/200 mL premix  Status:  Discontinued       "Followed by" Linked Group Details   1,000 mg 200 mL/hr over 60 Minutes Intravenous  Once 04/15/20 1403 04/15/20 1647   04/14/20 1000  remdesivir 100 mg in sodium chloride 0.9 % 100 mL IVPB       "Followed by" Linked Group Details   100 mg 200 mL/hr over 30 Minutes Intravenous Daily 04/13/20 2227 04/18/20 0959   04/13/20 2330  remdesivir 200 mg in sodium chloride 0.9% 250 mL IVPB       "Followed by" Linked Group Details   200 mg 580 mL/hr over 30 Minutes Intravenous Once 04/13/20 2227 04/14/20 0254      Inpatient Medications  Scheduled Meds: . amLODipine  5 mg Oral Daily  . apixaban  2.5 mg Oral BID  . vitamin C  500 mg Oral Daily  . benzonatate  200 mg Oral TID  . dexamethasone (DECADRON) injection  6 mg Intravenous Q24H  . DULoxetine  60 mg Oral BH-q7a  . Ipratropium-Albuterol  2 puff Inhalation Q6H  .  nitroGLYCERIN  1 inch Topical Q6H  . pantoprazole (PROTONIX) IV  40 mg Intravenous Q12H  . pravastatin  10 mg Oral q1800  . pregabalin  100 mg Oral BID  . QUEtiapine  25 mg Oral QHS  . zinc sulfate  220 mg Oral Daily   Continuous Infusions: . cefTRIAXone (ROCEPHIN)  IV    . lactated ringers    . remdesivir 100 mg in NS 100 mL Stopped (04/16/20 0900)  . vancomycin 750 mg (04/16/20 2204)   PRN Meds:.acetaminophen, albuterol, haloperidol lactate, hydrALAZINE, [DISCONTINUED] ondansetron **OR** ondansetron (ZOFRAN) IV, polyethylene glycol   Time Spent in minutes  25  See all Orders from today for further details   Lala Lund M.D on 04/17/2020 at 10:47 AM  To page go to [email or web address] - use universal password  [facility] -  Office  [phone]    Objective:   Vitals:   04/16/20 2102 04/17/20 0529 04/17/20 0754 04/17/20 0930  BP: (!) 154/100 (!) 158/108 (!) 169/83 (!) 162/110  Pulse: (!) 109 (!) 109 86   Resp: 20 20 16    Temp: 97.7  F (36.5 C) 98.4 F (36.9 C) 98.1 F (36.7 C)   TempSrc: Axillary Axillary Axillary   SpO2: 94% 95% 95%   Weight:      Height:        Wt Readings from Last 3 Encounters:  04/13/20 68 kg     Intake/Output Summary (Last 24 hours) at 04/17/2020 1047 Last data filed at 04/17/2020 0830 Gross per 24 hour  Intake 670 ml  Output 1015 ml  Net -345 ml     Physical Exam  Somnolent, moves all 4 extremities to stimuli, in no distress, [place].AT,PERRAL Supple Neck,No JVD, No cervical lymphadenopathy appriciated.  Symmetrical Chest wall movement, Good air movement bilaterally, CTAB RRR,No Gallops, Rubs or new Murmurs, No Parasternal Heave +ve B.Sounds, Abd Soft, No tenderness, No organomegaly appriciated, No rebound - guarding or rigidity. No Cyanosis, Clubbing or edema, No new Rash or bruise     Data Review:    CBC Recent Labs  Lab 04/13/20 1855 04/13/20 1855 04/14/20 0446 04/14/20 1015 04/15/20 0123 04/16/20 0352 04/17/20 0830  WBC 10.5   < > 7.6 6.0 6.4 12.7* 9.8  HGB 8.6*   < > 10.5* 11.7* 11.3* 11.6* 13.5  HCT 29.1*   < > 34.8* 39.5 37.2 37.1 44.1  PLT 426*   < > 299 291 329 351 444*  MCV 88.2   < > 84.7 85.9 83.4 83.2 82.9  MCH 26.1   < > 25.5* 25.4* 25.3* 26.0 25.4*  MCHC 29.6*   < > 30.2 29.6* 30.4 31.3 30.6  RDW 18.5*   < > 18.3* 18.8* 18.6* 19.1* 19.3*  LYMPHSABS 1.2  --  1.0  --  0.3* 0.4* 0.5*  MONOABS 1.2*  --  0.8  --  0.2 0.6 0.5  EOSABS 0.2  --  0.1  --  0.0 0.0 0.0  BASOSABS 0.0  --  0.0  --  0.0 0.0 0.0   < > = values in this interval not displayed.    Chemistries  Recent Labs  Lab 04/13/20 1855 04/14/20 0153 04/15/20 0123 04/16/20 0352 04/17/20 0830  NA 137 139 141 140 140  K 3.8 4.3 3.7 3.2* 3.5  CL 104 103 107 104 105  CO2 22 23 22 22  21*  GLUCOSE 127* 95 123* 108* 99  BUN 10 11 9 14 21   CREATININE 1.13* 1.11* 0.77 0.88 1.02*  CALCIUM  9.5 9.3 9.6 9.2 9.9  MG  --  2.0 2.0 1.9 2.0  AST 34 222* 77* 61* 45*  ALT 13 42 31 35 37  ALKPHOS  90 150* 131* 116 119  BILITOT 0.5 0.7 0.7 1.0 1.1   ------------------------------------------------------------------------------------------------------------------ No results for input(s): CHOL, HDL, LDLCALC, TRIG, CHOLHDL, LDLDIRECT in the last 72 hours.  No results found for: HGBA1C ------------------------------------------------------------------------------------------------------------------ No results for input(s): TSH, T4TOTAL, T3FREE, THYROIDAB in the last 72 hours.  Invalid input(s): FREET3 ------------------------------------------------------------------------------------------------------------------ No results for input(s): VITAMINB12, FOLATE, FERRITIN, TIBC, IRON, RETICCTPCT in the last 72 hours.  Coagulation profile No results for input(s): INR, PROTIME in the last 168 hours.  Recent Labs    04/16/20 0352 04/17/20 0830  DDIMER 2.87* 2.87*    Cardiac Enzymes No results for input(s): CKMB, TROPONINI, MYOGLOBIN in the last 168 hours.  Invalid input(s): CK ------------------------------------------------------------------------------------------------------------------    Component Value Date/Time   BNP 80.4 04/17/2020 0830    Micro Results Recent Results (from the past 240 hour(s))  Culture, Urine     Status: Abnormal   Collection Time: 04/13/20  6:59 AM   Specimen: Urine, Random  Result Value Ref Range Status   Specimen Description URINE, RANDOM  Final   Special Requests NONE  Final   Culture (A)  Final    <10,000 COLONIES/mL INSIGNIFICANT GROWTH Performed at [facility], [address], [place], [place] [address]    Report Status 04/15/2020 FINAL  Final  Blood Culture (routine x 2)     Status: Abnormal   Collection Time: 04/13/20  7:00 PM   Specimen: BLOOD  Result Value Ref Range Status   Specimen Description BLOOD SITE NOT SPECIFIED  Final   Special Requests   Final    BOTTLES DRAWN AEROBIC ONLY Blood Culture results may not be optimal  due to an inadequate volume of blood received in culture bottles   Culture  Setup Time   Final    GRAM POSITIVE COCCI AEROBIC BOTTLE ONLY Organism ID to follow CRITICAL RESULT CALLED TO, READ BACK BY AND VERIFIED WITH: PHARMD M [place] 04/14/20 AT 1853 SK    Culture (A)  Final    STAPHYLOCOCCUS HOMINIS THE SIGNIFICANCE OF ISOLATING THIS ORGANISM FROM A SINGLE SET OF BLOOD CULTURES WHEN MULTIPLE SETS ARE DRAWN IS UNCERTAIN. PLEASE NOTIFY THE MICROBIOLOGY DEPARTMENT WITHIN ONE WEEK IF SPECIATION AND SENSITIVITIES ARE REQUIRED. Performed at [facility], [place] [address], [place], [place] [address]    Report Status 04/15/2020 FINAL  Final  Blood Culture ID Panel (Reflexed)     Status: Abnormal   Collection Time: 04/13/20  7:00 PM  Result Value Ref Range Status   Enterococcus faecalis NOT DETECTED NOT DETECTED Final   Enterococcus Faecium NOT DETECTED NOT DETECTED Final   Listeria monocytogenes NOT DETECTED NOT DETECTED Final   Staphylococcus species DETECTED (A) NOT DETECTED Final    Comment: CRITICAL RESULT CALLED TO, READ BACK BY AND VERIFIED WITH: PHARMD M [place] 04/14/20 AT 1853 SK    Staphylococcus aureus (BCID) NOT DETECTED NOT DETECTED Final   Staphylococcus epidermidis NOT DETECTED NOT DETECTED Final   Staphylococcus lugdunensis NOT DETECTED NOT DETECTED Final   Streptococcus species NOT DETECTED NOT DETECTED Final   Streptococcus agalactiae NOT DETECTED NOT DETECTED Final   Streptococcus pneumoniae NOT DETECTED NOT DETECTED Final   Streptococcus pyogenes NOT DETECTED NOT DETECTED Final   A.calcoaceticus-baumannii NOT DETECTED NOT DETECTED Final   Bacteroides fragilis NOT DETECTED NOT DETECTED Final  Enterobacterales NOT DETECTED NOT DETECTED Final   Enterobacter cloacae complex NOT DETECTED NOT DETECTED Final   Escherichia coli NOT DETECTED NOT DETECTED Final   Klebsiella aerogenes NOT DETECTED NOT DETECTED Final   Klebsiella oxytoca NOT DETECTED NOT DETECTED Final    Klebsiella pneumoniae NOT DETECTED NOT DETECTED Final   Proteus species NOT DETECTED NOT DETECTED Final   Salmonella species NOT DETECTED NOT DETECTED Final   Serratia marcescens NOT DETECTED NOT DETECTED Final   Haemophilus influenzae NOT DETECTED NOT DETECTED Final   Neisseria meningitidis NOT DETECTED NOT DETECTED Final   Pseudomonas aeruginosa NOT DETECTED NOT DETECTED Final   Stenotrophomonas maltophilia NOT DETECTED NOT DETECTED Final   Candida albicans NOT DETECTED NOT DETECTED Final   Candida auris NOT DETECTED NOT DETECTED Final   Candida glabrata NOT DETECTED NOT DETECTED Final   Candida krusei NOT DETECTED NOT DETECTED Final   Candida parapsilosis NOT DETECTED NOT DETECTED Final   Candida tropicalis NOT DETECTED NOT DETECTED Final   Cryptococcus neoformans/gattii NOT DETECTED NOT DETECTED Final    Comment: Performed at [facility], [place] [address], [place], [place] [address]  Culture, blood (routine x 2)     Status: None (Preliminary result)   Collection Time: 04/15/20  6:06 PM   Specimen: BLOOD LEFT ARM  Result Value Ref Range Status   Specimen Description BLOOD LEFT ARM  Final   Special Requests   Final    BOTTLES DRAWN AEROBIC ONLY Blood Culture results may not be optimal due to an inadequate volume of blood received in culture bottles   Culture  Setup Time   Final    GRAM POSITIVE COCCI IN CLUSTERS AEROBIC BOTTLE ONLY CRITICAL VALUE NOTED.  VALUE IS CONSISTENT WITH PREVIOUSLY REPORTED AND CALLED VALUE. Performed at [facility], [place] [address], [place],  [address]    Culture GRAM POSITIVE COCCI  Final   Report Status PENDING  Incomplete  Culture, blood (routine x 2)     Status: None (Preliminary result)   Collection Time: 04/15/20  6:45 PM   Specimen: BLOOD  Result Value Ref Range Status   Specimen Description BLOOD SITE NOT SPECIFIED  Final   Special Requests   Final    BOTTLES DRAWN AEROBIC AND ANAEROBIC Blood Culture adequate volume   Culture    Final    NO GROWTH 2 DAYS Performed at [facility], [place] [address], [place],  [address]    Report Status PENDING  Incomplete    Radiology Reports CT Head Wo Contrast  Result Date: 04/13/2020 CLINICAL DATA:  79 year old female with head trauma. EXAM: CT HEAD WITHOUT CONTRAST CT CERVICAL SPINE WITHOUT CONTRAST TECHNIQUE: Multidetector CT imaging of the head and cervical spine was performed following the standard protocol without intravenous contrast. Multiplanar CT image reconstructions of the cervical spine were also generated. COMPARISON:  CT dated 08/28/2019. FINDINGS: CT HEAD FINDINGS Brain: Mild age-related atrophy and chronic microvascular ischemic changes. There is no acute intracranial hemorrhage. No mass effect or midline shift. No extra-axial fluid collection. Vascular: No hyperdense vessel or unexpected calcification. Skull: Normal. Negative for fracture or focal lesion. Sinuses/Orbits: No acute finding. Other: None CT CERVICAL SPINE FINDINGS Alignment: No acute subluxation. Skull base and vertebrae: No acute fracture. Osteopenia. Soft tissues and spinal canal: No prevertebral fluid or swelling. No visible canal hematoma. Disc levels:  Degenerative changes. No acute findings. Upper chest: Small left upper lobe calcified granuloma. Partially visualized right subclavian central venous line. Other: Bilateral  carotid bulb calcified plaques. IMPRESSION: 1. No acute intracranial pathology. Mild age-related atrophy and chronic microvascular ischemic changes. 2. No acute/traumatic cervical spine pathology. Electronically Signed   By: Anner Crete M.D.   On: 04/13/2020 20:44   CT CHEST WO CONTRAST  Result Date: 04/14/2020 CLINICAL DATA:  Respiratory illness EXAM: CT CHEST WITHOUT CONTRAST TECHNIQUE: Multidetector CT imaging of the chest was performed following the standard protocol without IV contrast. COMPARISON:  Chest x-ray 04/13/2020.  CT 04/11/2020 FINDINGS: Cardiovascular:  Aortic atherosclerosis. Scattered coronary artery calcifications. Heart is normal size and aorta is normal caliber. Mediastinum/Nodes: Small scattered non pathologically enlarged mediastinal lymph nodes, stable. No mediastinal, hilar, or axillary adenopathy. Trachea and esophagus are unremarkable. Thyroid unremarkable. Lungs/Pleura: Peripheral interstitial prominence in thickening compatible with fibrosis, stable. Rounded partially necrotic mass in the posterior right lower lobe measures 3.1 cm and is slightly decreased in size since prior study when this measured 3.8 cm. No effusions. No confluent opacities on the left. Calcified granuloma in the left upper lobe. Upper Abdomen: Imaging into the upper abdomen demonstrates no acute findings. Musculoskeletal: Chest wall soft tissues are unremarkable. Right upper chest wall Port-A-Cath remains in place, unchanged. No acute bony abnormality. Old posterior right 7th rib fracture and upper thoracic compression fractures, unchanged. IMPRESSION: Partially necrotic mass in the posterior right lower lobe is again noted, slightly decreased in size since prior study, 3.1 cm currently compared to 3.8 cm previously. Changes of fibrosis throughout the lungs. Coronary artery disease. Aortic Atherosclerosis (ICD10-I70.0). Electronically Signed   By: Rolm Baptise M.D.   On: 04/14/2020 18:33   CT Cervical Spine Wo Contrast  Result Date: 04/13/2020 CLINICAL DATA:  79 year old female with head trauma. EXAM: CT HEAD WITHOUT CONTRAST CT CERVICAL SPINE WITHOUT CONTRAST TECHNIQUE: Multidetector CT imaging of the head and cervical spine was performed following the standard protocol without intravenous contrast. Multiplanar CT image reconstructions of the cervical spine were also generated. COMPARISON:  CT dated 08/28/2019. FINDINGS: CT HEAD FINDINGS Brain: Mild age-related atrophy and chronic microvascular ischemic changes. There is no acute intracranial hemorrhage. No mass effect or  midline shift. No extra-axial fluid collection. Vascular: No hyperdense vessel or unexpected calcification. Skull: Normal. Negative for fracture or focal lesion. Sinuses/Orbits: No acute finding. Other: None CT CERVICAL SPINE FINDINGS Alignment: No acute subluxation. Skull base and vertebrae: No acute fracture. Osteopenia. Soft tissues and spinal canal: No prevertebral fluid or swelling. No visible canal hematoma. Disc levels:  Degenerative changes. No acute findings. Upper chest: Small left upper lobe calcified granuloma. Partially visualized right subclavian central venous line. Other: Bilateral carotid bulb calcified plaques. IMPRESSION: 1. No acute intracranial pathology. Mild age-related atrophy and chronic microvascular ischemic changes. 2. No acute/traumatic cervical spine pathology. Electronically Signed   By: Anner Crete M.D.   On: 04/13/2020 20:44   DG Pelvis Portable  Result Date: 04/13/2020 CLINICAL DATA:  Fall EXAM: PORTABLE PELVIS 1-2 VIEWS COMPARISON:  None. FINDINGS: Prior right hip replacement. Normal AP alignment. Mild degenerative changes within the left hip. SI joints symmetric and unremarkable. No acute bony abnormality. Specifically, no fracture, subluxation, or dislocation. IMPRESSION: No acute bony abnormality. Electronically Signed   By: Rolm Baptise M.D.   On: 04/13/2020 19:21   DG Chest Port 1 View  Result Date: 04/13/2020 CLINICAL DATA:  Fall, COVID EXAM: PORTABLE CHEST 1 VIEW COMPARISON:  04/11/2020 FINDINGS: Right Port-A-Cath remains in place, unchanged. Heart is normal size. Low lung volumes. Diffuse interstitial prominence throughout the lungs compatible with chronic lung disease/fibrosis. No  definite acute confluent airspace opacity. Previously seen rounded mass posteriorly in the right lung better seen on prior CT. No effusions. IMPRESSION: Stable appearance of the chest. Electronically Signed   By: Rolm Baptise M.D.   On: 04/13/2020 19:21   EEG adult  Result  Date: 04/16/2020 Lora Havens, MD     04/16/2020  1:37 PM Patient Name: MARISABEL MACPHERSON MRN: [id number] Epilepsy Attending: Lora Havens Referring Physician/Provider: Dr Oren Binet Date: 04/16/2020 Duration: 25.12 mins Patient history: 79yo F with ams. EEG to evaluate for seizure. Level of alertness: Awake AEDs during EEG study: xanax, pregabalin Technical aspects: This EEG study was done with scalp electrodes positioned according to the 10-20 International system of electrode placement. Electrical activity was acquired at a sampling rate of 500Hz  and reviewed with a high frequency filter of 70Hz  and a low frequency filter of 1Hz . EEG data were recorded continuously and digitally stored. Description: The posterior dominant rhythm consists of 8 Hz activity of moderate voltage (25-35 uV) seen predominantly in posterior head regions, symmetric and reactive to eye opening and eye closing.  EEG showed intermittent generalized 3 to 6 Hz theta-delta slowing. Triphasic waves, generalized were also noted. Hyperventilation and photic stimulation were not performed.   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 No seizures or epileptiform discharges were seen throughout the recording. Priyanka Barbra Sarks

## 2020-04-17 NOTE — Telephone Encounter (Signed)
Noted  

## 2020-04-17 NOTE — TOC Initial Note (Addendum)
Transition of Care [facility]) - Initial/Assessment Note    Patient Details  Name: [name] MRN: [id number] Date of Birth: August 05, 1940  [facility] CM/SW Contact:    Benard Halsted, LCSW Phone Number: 04/17/2020, 12:30 PM  Clinical Narrative:                 CSW received consult for possible SNF at time of discharge. CSW spoke with patient's daughter, [name] regarding PT recommendation of SNF at time of discharge. Patient's daughter reported that she would like patient to return home with home health services instead of SNF as she has been several times in the past and they have all treated patient terribly. She lives with the patient and cares for her and reports no preference for home health agencies. She has a rollator at home and gets radiation treatments for her cancer. Patient's daughter reported fear that patient will not return back to her mental baseline. CSW confirmed PCP and address. CSW to continue to follow and assist with discharge planning needs.  Update: [facility] has accepted patient.   Expected Discharge Plan: [place] Barriers to Discharge: Requiring sitter/restraints, Continued Medical Work up   Patient Goals and CMS Choice Patient states their goals for this hospitalization and ongoing recovery are:: Return to baseline CMS Medicare.gov Compare Post Acute Care list provided to:: Patient Represenative (must comment) (Daughter) Choice offered to / list presented to : Adult Children  Expected Discharge Plan and Services Expected Discharge Plan: [place] In-house Referral: Clinical Social Work Discharge Planning Services: CM Consult Post Acute Care Choice: [place] arrangements for the past 2 months: Mobile Home                           HH Arranged: RN, PT, OT          Prior Living Arrangements/Services Living arrangements for the past 2 months: Mobile Home Lives with:: Adult  Children Patient language and need for interpreter reviewed:: Yes Do you feel safe going back to the place where you live?: Yes      Need for Family Participation in Patient Care: Yes (Comment) Care giver support system in place?: Yes (comment) Current home services: DME (Rollator) Criminal Activity/Legal Involvement Pertinent to Current Situation/Hospitalization: No - Comment as needed  Activities of Daily Living      Permission Sought/Granted Permission sought to share information with : Facility Sport and exercise psychologist, Family Supports Permission granted to share information with : No  Share Information with NAME: [name]  Permission granted to share info w AGENCY: SNFs  Permission granted to share info w Relationship: Daughter  Permission granted to share info w Contact Information: [phone]  Emotional Assessment   Attitude/Demeanor/Rapport: Unable to Assess Affect (typically observed): Unable to Assess Orientation: : Oriented to Self Alcohol / Substance Use: Not Applicable Psych Involvement: No (comment)  Admission diagnosis:  Altered mental status, unspecified altered mental status type [R41.82] COVID [U07.1] COVID-19 virus infection [U07.1] Patient Active Problem List   Diagnosis Date Noted  . COVID-19 virus infection 04/13/2020  . Acute metabolic encephalopathy 19/41/7408  . COPD with acute exacerbation ([place]) 04/13/2020  . History of pulmonary embolism 04/13/2020  . Chronic respiratory failure with hypoxia ([place]) 04/13/2020  . Squamous cell carcinoma of lung, stage III, right ([place]) 04/13/2020  . GERD (gastroesophageal reflux disease) 04/13/2020  . Fall at home, initial encounter 04/13/2020  . Anemia 04/13/2020  PCP:  [name], FNP Pharmacy:   [id number] [place], [address] [place] [address] Phone: [phone] Fax: [phone]     Social Determinants of Health (SDOH) Interventions    Readmission  Risk Interventions No flowsheet data found.

## 2020-04-18 ENCOUNTER — Inpatient Hospital Stay: Payer: Medicare Other

## 2020-04-18 ENCOUNTER — Inpatient Hospital Stay (HOSPITAL_COMMUNITY): Payer: Medicare Other

## 2020-04-18 ENCOUNTER — Ambulatory Visit: Payer: Medicare Other

## 2020-04-18 DIAGNOSIS — U071 COVID-19: Secondary | ICD-10-CM | POA: Diagnosis not present

## 2020-04-18 LAB — CBC WITH DIFFERENTIAL/PLATELET
Abs Immature Granulocytes: 0.04 10*3/uL (ref 0.00–0.07)
Basophils Absolute: 0 10*3/uL (ref 0.0–0.1)
Basophils Relative: 0 %
Eosinophils Absolute: 0 10*3/uL (ref 0.0–0.5)
Eosinophils Relative: 0 %
HCT: 39.3 % (ref 36.0–46.0)
Hemoglobin: 12.3 g/dL (ref 12.0–15.0)
Immature Granulocytes: 0 %
Lymphocytes Relative: 6 %
Lymphs Abs: 0.8 10*3/uL (ref 0.7–4.0)
MCH: 25.8 pg — ABNORMAL LOW (ref 26.0–34.0)
MCHC: 31.3 g/dL (ref 30.0–36.0)
MCV: 82.6 fL (ref 80.0–100.0)
Monocytes Absolute: 1 10*3/uL (ref 0.1–1.0)
Monocytes Relative: 9 %
Neutro Abs: 9.9 10*3/uL — ABNORMAL HIGH (ref 1.7–7.7)
Neutrophils Relative %: 85 %
Platelets: 447 10*3/uL — ABNORMAL HIGH (ref 150–400)
RBC: 4.76 MIL/uL (ref 3.87–5.11)
RDW: 18.6 % — ABNORMAL HIGH (ref 11.5–15.5)
WBC: 11.7 10*3/uL — ABNORMAL HIGH (ref 4.0–10.5)
nRBC: 0 % (ref 0.0–0.2)

## 2020-04-18 LAB — COMPREHENSIVE METABOLIC PANEL
ALT: 32 U/L (ref 0–44)
AST: 31 U/L (ref 15–41)
Albumin: 2.7 g/dL — ABNORMAL LOW (ref 3.5–5.0)
Alkaline Phosphatase: 109 U/L (ref 38–126)
Anion gap: 16 — ABNORMAL HIGH (ref 5–15)
BUN: 27 mg/dL — ABNORMAL HIGH (ref 8–23)
CO2: 20 mmol/L — ABNORMAL LOW (ref 22–32)
Calcium: 9.4 mg/dL (ref 8.9–10.3)
Chloride: 102 mmol/L (ref 98–111)
Creatinine, Ser: 0.91 mg/dL (ref 0.44–1.00)
GFR, Estimated: >60 mL/min (ref >60)
Glucose, Bld: 105 mg/dL — ABNORMAL HIGH (ref 70–99)
Potassium: 3.2 mmol/L — ABNORMAL LOW (ref 3.5–5.1)
Sodium: 138 mmol/L (ref 135–145)
Total Bilirubin: 1 mg/dL (ref 0.3–1.2)
Total Protein: 8.3 g/dL — ABNORMAL HIGH (ref 6.5–8.1)

## 2020-04-18 LAB — CULTURE, BLOOD (ROUTINE X 2)

## 2020-04-18 LAB — BRAIN NATRIURETIC PEPTIDE: B Natriuretic Peptide: 67.4 pg/mL (ref 0.0–100.0)

## 2020-04-18 LAB — PROCALCITONIN: Procalcitonin: <0.1 ng/mL

## 2020-04-18 LAB — MAGNESIUM: Magnesium: 2 mg/dL (ref 1.7–2.4)

## 2020-04-18 LAB — C-REACTIVE PROTEIN: CRP: 1.5 mg/dL — ABNORMAL HIGH (ref <1.0)

## 2020-04-18 LAB — D-DIMER, QUANTITATIVE: D-Dimer, Quant: 1.95 ug/mL-FEU — ABNORMAL HIGH (ref 0.00–0.50)

## 2020-04-18 MED ORDER — POTASSIUM CHLORIDE 10 MEQ/100ML IV SOLN
10.0000 meq | INTRAVENOUS | Status: AC
  Administered 2020-04-18 (×3): 10 meq via INTRAVENOUS
  Filled 2020-04-18 (×2): qty 100

## 2020-04-18 MED ORDER — LINEZOLID 600 MG PO TABS
600.0000 mg | ORAL_TABLET | Freq: Two times a day (BID) | ORAL | Status: DC
  Administered 2020-04-18 – 2020-04-19 (×2): 600 mg via ORAL
  Filled 2020-04-18 (×9): qty 1

## 2020-04-18 MED ORDER — IPRATROPIUM-ALBUTEROL 20-100 MCG/ACT IN AERS
2.0000 | INHALATION_SPRAY | Freq: Two times a day (BID) | RESPIRATORY_TRACT | Status: DC
  Administered 2020-04-18 (×2): 2 via RESPIRATORY_TRACT

## 2020-04-18 MED ORDER — METOPROLOL TARTRATE 50 MG PO TABS
50.0000 mg | ORAL_TABLET | Freq: Two times a day (BID) | ORAL | Status: DC
  Administered 2020-04-18 – 2020-04-22 (×3): 50 mg via ORAL
  Filled 2020-04-18 (×5): qty 1

## 2020-04-18 MED ORDER — IPRATROPIUM-ALBUTEROL 20-100 MCG/ACT IN AERS: 2.0000 | INHALATION_SPRAY | Freq: Two times a day (BID) | RESPIRATORY_TRACT | Status: DC

## 2020-04-18 NOTE — Progress Notes (Signed)
PROGRESS NOTE                                                                                                                                                                                                             Patient Demographics:    [name], is a 79 y.o. female, DOB - 1940-10-12, DGL:[id number]  Outpatient Primary MD for the patient is [name], FNP   Admit date - 04/13/2020   LOS - 4  Chief Complaint  Patient presents with  . Fall       Brief Narrative: Patient is a 79 y.o. 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  COVID-19 vaccinated status: Unvaccinated  Significant Events: 11/18>> Admit to [facility] for weakness/confusion/COVID-19 pneumonia  Significant studies: 11/18>> chest x-ray: Diffuse interstitial prominence throughout both lungs compatible with chronic lung disease/fibrosis-no definite airspace opacity 11/18>> pelvic x-ray: No acute bony abnormality. 11/18>> CT head/C-spine: No acute abnormality in head/no fractures in C-spine 11/19>> CT chest: Partially necrotic mass in the right lower lobe-fibrosis throughout both lungs.  CAD. 11/21>> EEG - This study is suggestive of mild diffuse encephalopathy, nonspecific etiology but likely related to toxic-metabolic etiology, anoxic/hypoxic brain injury. No seizures or epileptiform discharges were seen throughout the recording. 11/22 >> MRI  COVID-19 medications: Steroids: 11/18>> Remdesivir: 11/18>> Monoclonal antibody: 11/16 x 1  Antibiotics: None  Microbiology data: 11/20>> blood cultures: Ordered 11/18 >>blood culture: Gram-positive cocci (BC ID-staph species-could be a contaminant) 11/18>> urine culture: <10,007  colonies/mL-insignificant growth  Procedures: None  Consults: None  DVT prophylaxis: apixaban (ELIQUIS) tablet 2.5 mg Start: 04/13/20 2230 apixaban (ELIQUIS) tablet 2.5 mg    Subjective:   Patient in bed, slightly more somnolent on 04/18/2020, unable to answer questions or follow commands reliably.  But in no distress.   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  Stable, on 2 L nasal cannula oxygen which she wears at home as well.  Fever: afebrile O2  requirements:  SpO2: 93 % O2 Flow Rate (L/min): 2 L/min     Recent Labs  Lab 04/13/20 1855 04/13/20 1855 04/14/20 0153 04/14/20 0446 04/14/20 1015 04/15/20 0123 04/16/20 0352 04/17/20 0830 04/18/20 0149  WBC 10.5  --   --    < > 6.0 6.4 12.7* 9.8 11.7*  HGB 8.6*  --   --    < > 11.7* 11.3* 11.6* 13.5 12.3  HCT 29.1*  --   --    < > 39.5 37.2 37.1 44.1 39.3  PLT 426*  --   --    < > 291 329 351 444* 447*  CRP 12.6*   < > 11.4*  --   --  7.6* 4.2* 3.1* 1.5*  BNP  --   --   --   --   --   --  103.8* 80.4 67.4  DDIMER 3.28*   < > 2.40*  --   --  3.26* 2.87* 2.87* 1.95*  PROCALCITON <0.10  --   --   --   --   --   --  <0.10 <0.10  AST 34   < > 222*  --   --  77* 61* 45* 31  ALT 13   < > 42  --   --  31 35 37 32  ALKPHOS 90   < > 150*  --   --  131* 116 119 109  BILITOT 0.5   < > 0.7  --   --  0.7 1.0 1.1 1.0  ALBUMIN 2.6*   < > 2.6*  --   --  2.6* 2.7* 2.9* 2.7*   < > = values in this interval not displayed.     COPD exacerbation: Improving-continue steroids and bronchodilators.  On home nocturnal O2.  Gram-positive bacteremia: Reviewed BCID results-probably may be a contaminant-however has a Port-A-Cath-immunocompromised and getting active radiation treatment - on IV Vanco, BC x 2  different dates 18th - 20th Staph Hominis - no clear source.  We will give total 2 weeks of IV vancomycin and monitor.  Acute toxic encephalopathy: Most likely COVID-19 inflammation related encephalopathy, CT, MRI and EEG all nonacute.  No focal deficits.  Will discontinue all possible sedating medications which include Seroquel, Lyrica, duloxetine and monitor.  As needed Haldol for agitation if needed.   Fall: Secondary debility/deconditioning at baseline-superimposed weakness due to COVID-19 infection.  Await PT/OT eval.  Anxiety/depression: Have restarted Xanax/Cymbalta-on Seroquel nightly starting today for severe delirium/encephalopathy.  Reassess tomorrow.    Sinus tachycardia: Mild-fluctuating heart rate-mostly gets tachycardic when she is agitated.  Patient is pulling out her telemetry leads-and will not let nursing staff patient back again.  We will go and discontinue telemetry for now-may help keep her calm and manage her delirium/encephalopathy.  Chronic pain syndrome-opiate dependent: Apparently recently started on transdermal fentanyl-this remains on hold-given few doses of IV morphine yesterday without any improvement in her mental status.    Squamous cell carcinoma of the lung-getting radiation treatments-resume outpatient follow-up with radiation oncology/medical oncology-could have some amount of fibrosis/inflammation in the lungs due to radiation.  On steroids.  Per daughter-patient on radiation treatment as was deemed to be a poor candidate for chemotherapy due to overall medical comorbidities.  Normocytic anemia: Hemoglobin admission was probably a lab error-no evidence of any GI loss-repeat hemoglobin this morning back to baseline.  Follow CBC.   History of VTE: Continue Eliquis  HTN: BP high, unable to take oral medications consistently, added Nitropaste and as  needed hydralazine.  Monitor.  HLD: Continue statin  GERD: Continue PPI  Goals of care: Long discussion with  patient's daughter over the phone on several days and again on 04/18/2020, DNR, gentle medical measures. If further decline full comfort care.   GI prophylaxis: PPI   Condition - Stable  Family Communication  : Daughter [name] [phone]) updated over the phone on 04/16/20, 04/17/20, 04/18/20 new present level of care if further decline comfort measures.  Code Status :  Full Code  Diet :  Diet Order            Diet 2 gram sodium Room service appropriate? Yes; Fluid consistency: Thin  Diet effective now                  Disposition Plan  :  Status is: Inpatient  The patient will require care spanning > 2 midnights and should be moved to inpatient because: Inpatient level of care appropriate due to severity of illness  Dispo: The patient is from: Home              Anticipated d/c is to: TBD              Anticipated d/c date is: 2 days              Patient currently is not medically stable to d/c.   Barriers to discharge: Hypoxia requiring O2 supplementation/complete 5 days of IV Remdesivir  Antimicorbials  :    Anti-infectives (From admission, onward)   Start     Dose/Rate Route Frequency Ordered Stop   04/17/20 1130  cefTRIAXone (ROCEPHIN) 1 g in sodium chloride 0.9 % 100 mL IVPB        1 g 200 mL/hr over 30 Minutes Intravenous Every 24 hours 04/17/20 1039 04/20/20 1129   04/16/20 2000  vancomycin (VANCOREADY) IVPB 750 mg/150 mL       "Followed by" Linked Group Details   750 mg 150 mL/hr over 60 Minutes Intravenous Every 24 hours 04/15/20 1647     04/16/20 1700  vancomycin (VANCOREADY) IVPB 750 mg/150 mL  Status:  Discontinued       "Followed by" Linked Group Details   750 mg 150 mL/hr over 60 Minutes Intravenous Every 24 hours 04/15/20 1403 04/15/20 1647   04/15/20 2000  vancomycin (VANCOCIN) IVPB 1000 mg/200 mL premix       "Followed by" Linked Group Details   1,000 mg 200 mL/hr over 60 Minutes Intravenous  Once 04/15/20 1647 04/16/20 0223   04/15/20 1700   vancomycin (VANCOCIN) IVPB 1000 mg/200 mL premix  Status:  Discontinued       "Followed by" Linked Group Details   1,000 mg 200 mL/hr over 60 Minutes Intravenous  Once 04/15/20 1403 04/15/20 1647   04/14/20 1000  remdesivir 100 mg in sodium chloride 0.9 % 100 mL IVPB       "Followed by" Linked Group Details   100 mg 200 mL/hr over 30 Minutes Intravenous Daily 04/13/20 2227 04/17/20 1555   04/13/20 2330  remdesivir 200 mg in sodium chloride 0.9% 250 mL IVPB       "Followed by" Linked Group Details   200 mg 580 mL/hr over 30 Minutes Intravenous Once 04/13/20 2227 04/14/20 0254      Inpatient Medications  Scheduled Meds: . apixaban  2.5 mg Oral BID  . vitamin C  500 mg Oral Daily  . benzonatate  200 mg Oral TID  . dexamethasone (DECADRON) injection  6  mg Intravenous Q24H  . DULoxetine  60 mg Oral BH-q7a  . Ipratropium-Albuterol  2 puff Inhalation BID  . metoprolol tartrate  50 mg Oral BID  . nitroGLYCERIN  1 inch Topical Q6H  . pantoprazole (PROTONIX) IV  40 mg Intravenous Q12H  . pravastatin  10 mg Oral q1800  . pregabalin  100 mg Oral BID  . QUEtiapine  12.5 mg Oral QHS  . zinc sulfate  220 mg Oral Daily   Continuous Infusions: . cefTRIAXone (ROCEPHIN)  IV 1 g (04/18/20 1128)  . potassium chloride 10 mEq (04/18/20 1108)  . vancomycin 750 mg (04/17/20 2007)   PRN Meds:.acetaminophen, albuterol, haloperidol lactate, hydrALAZINE, [DISCONTINUED] ondansetron **OR** ondansetron (ZOFRAN) IV, polyethylene glycol   Time Spent in minutes  25  See all Orders from today for further details   Lala Lund M.D on 04/18/2020 at 11:43 AM  To page go to [email or web address] - use universal password  [facility] -  Office  [phone]    Objective:   Vitals:   04/17/20 1336 04/17/20 1950 04/18/20 0612 04/18/20 0747  BP: (!) 147/97 (!) 149/95 (!) 144/97 (!) 156/94  Pulse: 98 (!) 103 (!) 102 (!) 108  Resp: 16 18 18 20   Temp: 98.7 F (37.1 C) 98.7 F (37.1 C) 97.7 F (36.5  C) 98.8 F (37.1 C)  TempSrc: Oral Axillary Axillary Axillary  SpO2: 94% 95% 91% 93%  Weight:      Height:        Wt Readings from Last 3 Encounters:  04/13/20 68 kg     Intake/Output Summary (Last 24 hours) at 04/18/2020 1143 Last data filed at 04/18/2020 0930 Gross per 24 hour  Intake 562 ml  Output 300 ml  Net 262 ml     Physical Exam  Somnolent, unable to answer questions or follow commands, no wincing or suggestion of discomfort upon movement of both lower extremities, [place].AT,PERRAL Supple Neck,No JVD, No cervical lymphadenopathy appriciated.  Symmetrical Chest wall movement, Good air movement bilaterally, CTAB RRR,No Gallops, Rubs or new Murmurs, No Parasternal Heave +ve B.Sounds, Abd Soft, No tenderness, No organomegaly appriciated, No rebound - guarding or rigidity. No Cyanosis, Clubbing or edema, No new Rash or bruise      Data Review:    CBC Recent Labs  Lab 04/14/20 0446 04/14/20 0446 04/14/20 1015 04/15/20 0123 04/16/20 0352 04/17/20 0830 04/18/20 0149  WBC 7.6   < > 6.0 6.4 12.7* 9.8 11.7*  HGB 10.5*   < > 11.7* 11.3* 11.6* 13.5 12.3  HCT 34.8*   < > 39.5 37.2 37.1 44.1 39.3  PLT 299   < > 291 329 351 444* 447*  MCV 84.7   < > 85.9 83.4 83.2 82.9 82.6  MCH 25.5*   < > 25.4* 25.3* 26.0 25.4* 25.8*  MCHC 30.2   < > 29.6* 30.4 31.3 30.6 31.3  RDW 18.3*   < > 18.8* 18.6* 19.1* 19.3* 18.6*  LYMPHSABS 1.0  --   --  0.3* 0.4* 0.5* 0.8  MONOABS 0.8  --   --  0.2 0.6 0.5 1.0  EOSABS 0.1  --   --  0.0 0.0 0.0 0.0  BASOSABS 0.0  --   --  0.0 0.0 0.0 0.0   < > = values in this interval not displayed.    Chemistries  Recent Labs  Lab 04/14/20 0153 04/15/20 0123 04/16/20 0352 04/17/20 0830 04/18/20 0149  NA 139 141 140 140 138  K 4.3 3.7 3.2* 3.5 3.2*  CL 103 107 104 105 102  CO2 23 22 22  21* 20*  GLUCOSE 95 123* 108* 99 105*  BUN 11 9 14 21  27*  CREATININE 1.11* 0.77 0.88 1.02* 0.91  CALCIUM 9.3 9.6 9.2 9.9 9.4  MG 2.0 2.0 1.9 2.0 2.0   AST 222* 77* 61* 45* 31  ALT 42 31 35 37 32  ALKPHOS 150* 131* 116 119 109  BILITOT 0.7 0.7 1.0 1.1 1.0   ------------------------------------------------------------------------------------------------------------------ No results for input(s): CHOL, HDL, LDLCALC, TRIG, CHOLHDL, LDLDIRECT in the last 72 hours.  No results found for: HGBA1C ------------------------------------------------------------------------------------------------------------------ No results for input(s): TSH, T4TOTAL, T3FREE, THYROIDAB in the last 72 hours.  Invalid input(s): FREET3 ------------------------------------------------------------------------------------------------------------------ No results for input(s): VITAMINB12, FOLATE, FERRITIN, TIBC, IRON, RETICCTPCT in the last 72 hours.  Coagulation profile No results for input(s): INR, PROTIME in the last 168 hours.  Recent Labs    04/17/20 0830 04/18/20 0149  DDIMER 2.87* 1.95*    Cardiac Enzymes No results for input(s): CKMB, TROPONINI, MYOGLOBIN in the last 168 hours.  Invalid input(s): CK ------------------------------------------------------------------------------------------------------------------    Component Value Date/Time   BNP 67.4 04/18/2020 0149    Micro Results Recent Results (from the past 240 hour(s))  Culture, Urine     Status: Abnormal   Collection Time: 04/13/20  6:59 AM   Specimen: Urine, Random  Result Value Ref Range Status   Specimen Description URINE, RANDOM  Final   Special Requests NONE  Final   Culture (A)  Final    <10,000 COLONIES/mL INSIGNIFICANT GROWTH Performed at [facility], [address], [place], [place] [address]    Report Status 04/15/2020 FINAL  Final  Blood Culture (routine x 2)     Status: Abnormal   Collection Time: 04/13/20  7:00 PM   Specimen: BLOOD  Result Value Ref Range Status   Specimen Description BLOOD SITE NOT SPECIFIED  Final   Special Requests   Final    BOTTLES  DRAWN AEROBIC ONLY Blood Culture results may not be optimal due to an inadequate volume of blood received in culture bottles   Culture  Setup Time   Final    GRAM POSITIVE COCCI AEROBIC BOTTLE ONLY Organism ID to follow CRITICAL RESULT CALLED TO, READ BACK BY AND VERIFIED WITH: PHARMD M [place] 04/14/20 AT 1853 SK    Culture (A)  Final    STAPHYLOCOCCUS HOMINIS THE SIGNIFICANCE OF ISOLATING THIS ORGANISM FROM A SINGLE SET OF BLOOD CULTURES WHEN MULTIPLE SETS ARE DRAWN IS UNCERTAIN. PLEASE NOTIFY THE MICROBIOLOGY DEPARTMENT WITHIN ONE WEEK IF SPECIATION AND SENSITIVITIES ARE REQUIRED. Performed at [facility], [place] [address] [place], [place] [address]    Report Status 04/15/2020 FINAL  Final  Blood Culture ID Panel (Reflexed)     Status: Abnormal   Collection Time: 04/13/20  7:00 PM  Result Value Ref Range Status   Enterococcus faecalis NOT DETECTED NOT DETECTED Final   Enterococcus Faecium NOT DETECTED NOT DETECTED Final   Listeria monocytogenes NOT DETECTED NOT DETECTED Final   Staphylococcus species DETECTED (A) NOT DETECTED Final    Comment: CRITICAL RESULT CALLED TO, READ BACK BY AND VERIFIED WITH: PHARMD M MACCIA 04/14/20 AT 1853 SK    Staphylococcus aureus (BCID) NOT DETECTED NOT DETECTED Final   Staphylococcus epidermidis NOT DETECTED NOT DETECTED Final   Staphylococcus lugdunensis NOT DETECTED NOT DETECTED Final   Streptococcus species NOT DETECTED NOT DETECTED Final   Streptococcus agalactiae NOT DETECTED NOT DETECTED Final   Streptococcus  pneumoniae NOT DETECTED NOT DETECTED Final   Streptococcus pyogenes NOT DETECTED NOT DETECTED Final   A.calcoaceticus-baumannii NOT DETECTED NOT DETECTED Final   Bacteroides fragilis NOT DETECTED NOT DETECTED Final   Enterobacterales NOT DETECTED NOT DETECTED Final   Enterobacter cloacae complex NOT DETECTED NOT DETECTED Final   Escherichia coli NOT DETECTED NOT DETECTED Final   Klebsiella aerogenes NOT DETECTED NOT DETECTED Final    Klebsiella oxytoca NOT DETECTED NOT DETECTED Final   Klebsiella pneumoniae NOT DETECTED NOT DETECTED Final   Proteus species NOT DETECTED NOT DETECTED Final   Salmonella species NOT DETECTED NOT DETECTED Final   Serratia marcescens NOT DETECTED NOT DETECTED Final   Haemophilus influenzae NOT DETECTED NOT DETECTED Final   Neisseria meningitidis NOT DETECTED NOT DETECTED Final   Pseudomonas aeruginosa NOT DETECTED NOT DETECTED Final   Stenotrophomonas maltophilia NOT DETECTED NOT DETECTED Final   Candida albicans NOT DETECTED NOT DETECTED Final   Candida auris NOT DETECTED NOT DETECTED Final   Candida glabrata NOT DETECTED NOT DETECTED Final   Candida krusei NOT DETECTED NOT DETECTED Final   Candida parapsilosis NOT DETECTED NOT DETECTED Final   Candida tropicalis NOT DETECTED NOT DETECTED Final   Cryptococcus neoformans/gattii NOT DETECTED NOT DETECTED Final    Comment: Performed at [facility], [address] [place], [place] [address]  Culture, blood (routine x 2)     Status: Abnormal   Collection Time: 04/15/20  6:06 PM   Specimen: BLOOD LEFT ARM  Result Value Ref Range Status   Specimen Description BLOOD LEFT ARM  Final   Special Requests   Final    BOTTLES DRAWN AEROBIC ONLY Blood Culture results may not be optimal due to an inadequate volume of blood received in culture bottles   Culture  Setup Time   Final    GRAM POSITIVE COCCI IN CLUSTERS AEROBIC BOTTLE ONLY CRITICAL VALUE NOTED.  VALUE IS CONSISTENT WITH PREVIOUSLY REPORTED AND CALLED VALUE. Performed at [facility], [place] [address], [place], [place] [address]    Culture STAPHYLOCOCCUS HOMINIS (A)  Final   Report Status 04/18/2020 FINAL  Final   Organism ID, Bacteria STAPHYLOCOCCUS HOMINIS  Final      Susceptibility   Staphylococcus hominis - MIC*    CIPROFLOXACIN >=8 RESISTANT Resistant     ERYTHROMYCIN >=8 RESISTANT Resistant     GENTAMICIN <=0.5 SENSITIVE Sensitive     OXACILLIN >=4 RESISTANT  Resistant     TETRACYCLINE >=16 RESISTANT Resistant     VANCOMYCIN <=0.5 SENSITIVE Sensitive     TRIMETH/SULFA 80 RESISTANT Resistant     CLINDAMYCIN RESISTANT Resistant     RIFAMPIN <=0.5 SENSITIVE Sensitive     Inducible Clindamycin POSITIVE Resistant     * STAPHYLOCOCCUS HOMINIS  Culture, blood (routine x 2)     Status: None (Preliminary result)   Collection Time: 04/15/20  6:45 PM   Specimen: BLOOD  Result Value Ref Range Status   Specimen Description BLOOD SITE NOT SPECIFIED  Final   Special Requests   Final    BOTTLES DRAWN AEROBIC AND ANAEROBIC Blood Culture adequate volume   Culture   Final    NO GROWTH 3 DAYS Performed at [facility], [address] [place], [place] [address]    Report Status PENDING  Incomplete    Radiology Reports CT Head Wo Contrast  Result Date: 04/13/2020 CLINICAL DATA:  79 year old female with head trauma. EXAM: CT HEAD WITHOUT CONTRAST CT CERVICAL SPINE WITHOUT CONTRAST TECHNIQUE:  Multidetector CT imaging of the head and cervical spine was performed following the standard protocol without intravenous contrast. Multiplanar CT image reconstructions of the cervical spine were also generated. COMPARISON:  CT dated 08/28/2019. FINDINGS: CT HEAD FINDINGS Brain: Mild age-related atrophy and chronic microvascular ischemic changes. There is no acute intracranial hemorrhage. No mass effect or midline shift. No extra-axial fluid collection. Vascular: No hyperdense vessel or unexpected calcification. Skull: Normal. Negative for fracture or focal lesion. Sinuses/Orbits: No acute finding. Other: None CT CERVICAL SPINE FINDINGS Alignment: No acute subluxation. Skull base and vertebrae: No acute fracture. Osteopenia. Soft tissues and spinal canal: No prevertebral fluid or swelling. No visible canal hematoma. Disc levels:  Degenerative changes. No acute findings. Upper chest: Small left upper lobe calcified granuloma. Partially visualized right subclavian central  venous line. Other: Bilateral carotid bulb calcified plaques. IMPRESSION: 1. No acute intracranial pathology. Mild age-related atrophy and chronic microvascular ischemic changes. 2. No acute/traumatic cervical spine pathology. Electronically Signed   By: Anner Crete M.D.   On: 04/13/2020 20:44   CT CHEST WO CONTRAST  Result Date: 04/14/2020 CLINICAL DATA:  Respiratory illness EXAM: CT CHEST WITHOUT CONTRAST TECHNIQUE: Multidetector CT imaging of the chest was performed following the standard protocol without IV contrast. COMPARISON:  Chest x-ray 04/13/2020.  CT 04/11/2020 FINDINGS: Cardiovascular: Aortic atherosclerosis. Scattered coronary artery calcifications. Heart is normal size and aorta is normal caliber. Mediastinum/Nodes: Small scattered non pathologically enlarged mediastinal lymph nodes, stable. No mediastinal, hilar, or axillary adenopathy. Trachea and esophagus are unremarkable. Thyroid unremarkable. Lungs/Pleura: Peripheral interstitial prominence in thickening compatible with fibrosis, stable. Rounded partially necrotic mass in the posterior right lower lobe measures 3.1 cm and is slightly decreased in size since prior study when this measured 3.8 cm. No effusions. No confluent opacities on the left. Calcified granuloma in the left upper lobe. Upper Abdomen: Imaging into the upper abdomen demonstrates no acute findings. Musculoskeletal: Chest wall soft tissues are unremarkable. Right upper chest wall Port-A-Cath remains in place, unchanged. No acute bony abnormality. Old posterior right 7th rib fracture and upper thoracic compression fractures, unchanged. IMPRESSION: Partially necrotic mass in the posterior right lower lobe is again noted, slightly decreased in size since prior study, 3.1 cm currently compared to 3.8 cm previously. Changes of fibrosis throughout the lungs. Coronary artery disease. Aortic Atherosclerosis (ICD10-I70.0). Electronically Signed   By: Rolm Baptise M.D.   On:  04/14/2020 18:33   CT Cervical Spine Wo Contrast  Result Date: 04/13/2020 CLINICAL DATA:  79 year old female with head trauma. EXAM: CT HEAD WITHOUT CONTRAST CT CERVICAL SPINE WITHOUT CONTRAST TECHNIQUE: Multidetector CT imaging of the head and cervical spine was performed following the standard protocol without intravenous contrast. Multiplanar CT image reconstructions of the cervical spine were also generated. COMPARISON:  CT dated 08/28/2019. FINDINGS: CT HEAD FINDINGS Brain: Mild age-related atrophy and chronic microvascular ischemic changes. There is no acute intracranial hemorrhage. No mass effect or midline shift. No extra-axial fluid collection. Vascular: No hyperdense vessel or unexpected calcification. Skull: Normal. Negative for fracture or focal lesion. Sinuses/Orbits: No acute finding. Other: None CT CERVICAL SPINE FINDINGS Alignment: No acute subluxation. Skull base and vertebrae: No acute fracture. Osteopenia. Soft tissues and spinal canal: No prevertebral fluid or swelling. No visible canal hematoma. Disc levels:  Degenerative changes. No acute findings. Upper chest: Small left upper lobe calcified granuloma. Partially visualized right subclavian central venous line. Other: Bilateral carotid bulb calcified plaques. IMPRESSION: 1. No acute intracranial pathology. Mild age-related atrophy and chronic microvascular ischemic changes. 2.  No acute/traumatic cervical spine pathology. Electronically Signed   By: Anner Crete M.D.   On: 04/13/2020 20:44   MR BRAIN WO CONTRAST  Result Date: 04/17/2020 CLINICAL DATA:  Coronavirus infection. Altered mental status. History of squamous cell carcinoma of the lung. EXAM: MRI HEAD WITHOUT CONTRAST TECHNIQUE: Multiplanar, multiecho pulse sequences of the brain and surrounding structures were obtained without intravenous contrast. COMPARISON:  Head CT 04/13/2020.  MRI 03/04/2020. FINDINGS: Brain: Diffusion imaging does not show any acute or subacute  infarction or other cause of restricted diffusion. No abnormality affects the brainstem or cerebellum. Cerebral hemispheres show age related atrophy with mild chronic small-vessel ischemic changes of the white matter, often seen at this age. No cortical or large vessel territory infarction. No mass lesion, hemorrhage, hydrocephalus or extra-axial collection. Vascular: Major vessels at the base of the brain show flow. Skull and upper cervical spine: Negative Sinuses/Orbits: Sinuses are clear except for a small amount of fluid layering in the left division of the sphenoid sinus, not likely significant. Orbits negative. Previous lens implant on right Other: None IMPRESSION: No acute finding. Age related atrophy. 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 Electronically Signed   By: Nelson Chimes M.D.   On: 04/17/2020 10:50   DG Pelvis Portable  Result Date: 04/13/2020 CLINICAL DATA:  Fall EXAM: PORTABLE PELVIS 1-2 VIEWS COMPARISON:  None. FINDINGS: Prior right hip replacement. Normal AP alignment. Mild degenerative changes within the left hip. SI joints symmetric and unremarkable. No acute bony abnormality. Specifically, no fracture, subluxation, or dislocation. IMPRESSION: No acute bony abnormality. Electronically Signed   By: Rolm Baptise M.D.   On: 04/13/2020 19:21   DG Chest Port 1 View  Result Date: 04/13/2020 CLINICAL DATA:  Fall, COVID EXAM: PORTABLE CHEST 1 VIEW COMPARISON:  04/11/2020 FINDINGS: Right Port-A-Cath remains in place, unchanged. Heart is normal size. Low lung volumes. Diffuse interstitial prominence throughout the lungs compatible with chronic lung disease/fibrosis. No definite acute confluent airspace opacity. Previously seen rounded mass posteriorly in the right lung better seen on prior CT. No effusions. IMPRESSION: Stable appearance of the chest. Electronically Signed   By: Rolm Baptise M.D.   On: 04/13/2020 19:21   DG Ankle Right  Port  Result Date: 04/17/2020 CLINICAL DATA:  79 year old female with soft tissue swelling of the lateral malleolus. No known injury. EXAM: PORTABLE RIGHT ANKLE - 2 VIEW; RIGHT FOOT - 2 VIEW COMPARISON:  None. FINDINGS: There is no acute fracture or dislocation. The bones are osteopenic. The ankle mortise is intact. The soft tissues are unremarkable. IMPRESSION: No acute fracture or dislocation. Electronically Signed   By: Anner Crete M.D.   On: 04/17/2020 21:16   DG Foot 2 Views Right  Result Date: 04/17/2020 CLINICAL DATA:  79 year old female with soft tissue swelling of the lateral malleolus. No known injury. EXAM: PORTABLE RIGHT ANKLE - 2 VIEW; RIGHT FOOT - 2 VIEW COMPARISON:  None. FINDINGS: There is no acute fracture or dislocation. The bones are osteopenic. The ankle mortise is intact. The soft tissues are unremarkable. IMPRESSION: No acute fracture or dislocation. Electronically Signed   By: Anner Crete M.D.   On: 04/17/2020 21:16   EEG adult  Result Date: 04/16/2020 Lora Havens, MD     04/16/2020  1:37 PM Patient Name: ARLEEN BAR MRN: [id number] Epilepsy Attending: Lora Havens Referring Physician/Provider: Dr Oren Binet Date: 04/16/2020 Duration: 25.12 mins Patient history: 79yo F with ams. EEG to evaluate  for seizure. Level of alertness: Awake AEDs during EEG study: xanax, pregabalin Technical aspects: This EEG study was done with scalp electrodes positioned according to the 10-20 International system of electrode placement. Electrical activity was acquired at a sampling rate of 500Hz  and reviewed with a high frequency filter of 70Hz  and a low frequency filter of 1Hz . EEG data were recorded continuously and digitally stored. Description: The posterior dominant rhythm consists of 8 Hz activity of moderate voltage (25-35 uV) seen predominantly in posterior head regions, symmetric and reactive to eye opening and eye closing.  EEG showed intermittent generalized 3 to  6 Hz theta-delta slowing. Triphasic waves, generalized were also noted. Hyperventilation and photic stimulation were not performed.   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 No seizures or epileptiform discharges were seen throughout the recording. Priyanka Barbra Sarks

## 2020-04-18 NOTE — Progress Notes (Signed)
Occupational Therapy Treatment Note  Session limited by lethargy and complaints of R hip pain. Pt apparently had Haldol last night. Required +2 Max A to progress to EOB and able to maintain sitting upright for @ 10 minutes before initiating putting self back to bed. SpO2 in 90s; HR increased to 154 - most likely partially related to pain. Spoke with daughter over the phone who states her Mom had hip surgery in March and she's not suppose to do things like bend forward or turn her foot in because her hip has dislocated twice". Daughter also states her Mom take oxycodone in addition to Fentanyl pain patch daily for pain control - discussed with PT who notified MD regarding pain control. Pt demonstrates a significant functional decline from her baseline where she was ambulating with a rollator and completing her ADL tasks with S. Recommend rehab at SNF to maximize functional level of independence.  Will continue to follow acutely.    04/18/20 1352  OT Visit Information  Last OT Received On 04/18/20  Assistance Needed +2  PT/OT/SLP Co-Evaluation/Treatment Yes  OT goals addressed during session ADL's and self-care;Strengthening/ROM  History of Present Illness Pt is a 79 y.o. female dx with COVID-19 on 04/11/20, now admitted 04/13/20 with fall, generalized weakness and confusion. Workup for acute on chronic hypoxic respiratory failure due to COVID PNA, acute toxic encephalopathy. Chest CT with partially necrotic mass in RLL, fibrosis throughout lungs. Head MRI negative for acute abnormality. EEG suggestive of mild diffuse encephalopathy, nonspecific etiology but likely related to toxic-metabolic etiology, anoxic/hypoxic brain injury. 11/22 noted severe pain RLE; MD ordered xray Rt foot, ankle negative (noted pelvic xray 11/18 negative). 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  Precautions  Precautions Fall  Precaution Comments Agitation and confusion  Pain  Assessment  Pain Assessment Faces  Faces Pain Scale 8  Pain Location pt points to rt lateral leg, from below knee to lateral thigh  Pain Descriptors / Indicators Guarding;Grimacing;Moaning  Pain Intervention(s) Limited activity within patient's tolerance;Other (comment) (RN notified)  Cognition  Arousal/Alertness Lethargic (awakened on arrival; frequent eye closing )  Behavior During Therapy WFL for tasks assessed/performed;Anxious (anxious if try to move RLE)  Overall Cognitive Status No family/caregiver present to determine baseline cognitive functioning  General Comments more lethargic/easily falling asleep during session; able to indicate area of pain for RLE today; apparent confusion. Pt given comb for hair however pt reaching forward with inappropriate use of object  Upper Extremity Assessment  Upper Extremity Assessment Generalized weakness  Lower Extremity Assessment  Lower Extremity Assessment Defer to PT evaluation  RLE Deficits / Details increased complaints of RLE pain; previous posteiror THR per daughter - has dislocated hip x 2 in past since March 2021  ADL  Overall ADL's  Needs assistance/impaired  Eating/Feeding Maximal assistance;Bed level  Grooming Maximal assistance  Upper Body Bathing Maximal assistance  Lower Body Bathing Maximal assistance  Upper Body Dressing  Maximal assistance  Functional mobility during ADLs  (attempted; pt declined, pushing self back into bed)  Bed Mobility  Overal bed mobility Needs Assistance  Bed Mobility Supine to Sit;Sit to Supine  Supine to sit Total assist;+2 for physical assistance;HOB elevated  Sit to supine +2 for physical assistance;Max assist (Pt iniated lifting legs but required assist to place on bed)  General bed mobility comments With 2 person assist able to pivot pt using bed pad to sit at EOB. Initially pt kept both knees in extension at  EOB. With cues would bend Left knee to put foot on floor but would only slightly bend  rt knee and would not rest foot on the floor.   Balance  Overall balance assessment Needs assistance  Sitting-balance support Feet supported;Single extremity supported  Sitting balance-Leahy Scale Poor  Sitting balance - Comments once seated EOB continued to use her RUE for support with tendency to lean posteriorly  Postural control Posterior lean  Restrictions  Other Position/Activity Restrictions Pt holding RLE in some internal rotation and knee extension (even when sitting EOB)  Transfers  General transfer comment unable due to RLE pain and cognitive status  OT - End of Session  Activity Tolerance Patient limited by lethargy;Patient limited by pain  Patient left in bed;with call bell/phone within reach;with bed alarm set (modified chair position)  Nurse Communication Mobility status;Other (comment) (pain control)  OT Assessment/Plan  OT Plan Discharge plan remains appropriate  OT Visit Diagnosis Unsteadiness on feet (R26.81);Other abnormalities of gait and mobility (R26.89);Muscle weakness (generalized) (M62.81);Other symptoms and signs involving cognitive function;Pain  Pain - Right/Left Right  Pain - part of body Hip;Leg  OT Frequency (ACUTE ONLY) Min 2X/week  Recommendations for Other Services Other (comment) (Palliative for pain control)  Follow Up Recommendations SNF;Supervision/Assistance - 24 hour  OT Equipment Other (comment) (TBA)  AM-PAC OT "6 Clicks" Daily Activity Outcome Measure (Version 2)  Help from another person eating meals? 2  Help from another person taking care of personal grooming? 2  Help from another person toileting, which includes using toliet, bedpan, or urinal? 1  Help from another person bathing (including washing, rinsing, drying)? 2  Help from another person to put on and taking off regular upper body clothing? 2  Help from another person to put on and taking off regular lower body clothing? 1  6 Click Score 10  OT Goal Progression  Progress  towards OT goals Not progressing toward goals - comment (due to lethargy and pain)  Acute Rehab OT Goals  Patient Stated Goal "I need to leave"  OT Goal Formulation Patient unable to participate in goal setting  Time For Goal Achievement 04/29/20  Potential to Achieve Goals Good  ADL Goals  Pt Will Perform Eating with set-up;with supervision;sitting  Pt Will Perform Grooming with set-up;with supervision;sitting  Pt Will Transfer to Toilet with min assist;stand pivot transfer;bedside commode  Additional ADL Goal #1 Pt will sustain attention to ADL with Min cues  OT Time Calculation  OT Start Time (ACUTE ONLY) 1144  OT Stop Time (ACUTE ONLY) 1218  OT Time Calculation (min) 34 min  OT General Charges  $OT Visit 1 Visit  OT Treatments  $Self Care/Home Management  8-22 mins  Maurie Boettcher, OT/L   Acute OT Clinical Specialist [place] Pager [phone] Office [phone]

## 2020-04-18 NOTE — Progress Notes (Signed)
Pharmacy Antibiotic Note  [name] is a 79 y.o. female admitted on 04/13/2020 with sepsis.  Pharmacy has been consulted for vanc dosing.  Pt has been here for COVID. She also has squamous cell carcinoma and a porta cath in place. She had blood cultures drawn the other day but only one bottle was collected. It came back positive for staph species on the BCID. It could still be a contaminant. We are repeating blood cultures today and empirically start her one empiric vanc.    The repeat set also came back with 1/3 staph hominis. Dr. Candiss Norse plan to treat with a total of 14d due to his porta-cath and immunocompromised status. His platelets and hgb are fine so we will transition him off to PO linezolid for the course. (Dr Candiss Norse)   Plan: Dc vanc Linezolid 600mg  PO BID x12 more days  Height: 5\' 4"  (162.6 cm) Weight: 68 kg (150 lb) IBW/kg (Calculated) : 54.7  Temp (24hrs), Avg:98.4 F (36.9 C), Min:97.7 F (36.5 C), Max:98.8 F (37.1 C)  Recent Labs  Lab 04/14/20 0153 04/14/20 0446 04/14/20 1015 04/15/20 0123 04/16/20 0352 04/17/20 0830 04/18/20 0149  WBC  --    < > 6.0 6.4 12.7* 9.8 11.7*  CREATININE 1.11*  --   --  0.77 0.88 1.02* 0.91   < > = values in this interval not displayed.    Estimated Creatinine Clearance: 48.3 mL/min (by C-G formula based on SCr of 0.91 mg/dL).    Allergies  Allergen Reactions  . Ampicillin Itching and Nausea And Vomiting  . Other     Muscle relaxer  . Sulfa Antibiotics Itching and Nausea And Vomiting  . Toradol [Ketorolac Tromethamine] Itching and Nausea And Vomiting  . Tramadol Itching and Nausea And Vomiting    Antimicrobials this admission: 11/20 vanc >>11/23 11/23 linezolid>>12/4 11/22 ceftriaxone>>11/24  Dose adjustments this admission:   Microbiology results: 11/18 urine>>neg 11/18 blood (1bottle drawn)>> staph hominis 11/20 blood>>1/3 staph hominis  Onnie Boer, PharmD, BCIDP, AAHIVP, CPP Infectious Disease  Pharmacist 04/18/2020 4:24 PM

## 2020-04-18 NOTE — Care Management Important Message (Signed)
Important Message  Patient Details  Name: [name] MRN: [id number] Date of Birth: April 02, 1941   Medicare Important Message Given:  Yes - Important Message mailed due to current National Emergency  Verbal consent obtained due to current National Emergency  Relationship to patient: Self Contact Name: Elmarie Devlin Call Date: 04/18/20  Time: 1132 Phone: [phone] Outcome: No Answer/Busy Important Message mailed to: Patient address on file    [name] 04/18/2020, 11:32 AM

## 2020-04-18 NOTE — Progress Notes (Signed)
Physical Therapy Treatment Patient Details Name: [name] MRN: [id number] DOB: 1941/01/23 Today's Date: 04/18/2020    History of Present Illness Pt is a 79 y.o. female dx with COVID-19 on 04/11/20, now admitted 04/13/20 with fall, generalized weakness and confusion. Workup for acute on chronic hypoxic respiratory failure due to COVID PNA, acute toxic encephalopathy. Chest CT with partially necrotic mass in RLL, fibrosis throughout lungs. Head MRI negative for acute abnormality. EEG suggestive of mild diffuse encephalopathy, nonspecific etiology but likely related to toxic-metabolic etiology, anoxic/hypoxic brain injury. 11/22 noted severe pain RLE; MD ordered xray Rt foot, ankle negative (noted pelvic xray 11/18 negative). 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    PT Comments    Patient more groggy today (noted she had Haldol at 2300 11/22), however arouses and able to participate. She continues with RLE pain with any attempts to move RLE and resists movement. With 2 person assist able to pivot her to sit EOB with pt progressing to minguard assist with single UE support. While seated EOB, she maintained rt knee in extension and pointed to entire lateral right leg as area of pain. MD made aware. OT called and spoke with patient's daughter and she reported she continues to have rt hip pain since hip revision surgery in March '21. She was ambulatory AND took oxycodone 10mg  4 times per day. She also stated pt has dislocated her right hip twice since March. Dr. Candiss Norse updated     Follow Up Recommendations  Home health PT;Supervision/Assistance - 24 hour (daughter refusing SNF; has cared for pt at home)     Equipment Recommendations  Other (comment) (?wheelchair, although ?home accessible)    Recommendations for Other Services       Precautions / Restrictions Precautions Precautions: Fall Precaution Comments: Agitation and  confusion Restrictions Weight Bearing Restrictions: No Other Position/Activity Restrictions: Pt holding RLE in some internal rotation and knee extension (even when sitting EOB)    Mobility  Bed Mobility Overal bed mobility: Needs Assistance Bed Mobility: Supine to Sit;Sit to Supine     Supine to sit: Total assist;+2 for physical assistance;HOB elevated Sit to supine: Total assist;+2 for physical assistance   General bed mobility comments: With 2 person assist able to pivot pt using bed pad to sit at EOB. Initially pt kept both knees in extension at EOB. With cues would bend Left knee to put foot on floor but would only slightly bend rt knee and would not rest foot on the floor.   Transfers                 General transfer comment: unable due to RLE pain  Ambulation/Gait                 Stairs             Wheelchair Mobility    Modified Rankin (Stroke Patients Only)       Balance Overall balance assessment: Needs assistance Sitting-balance support: Feet supported;Single extremity supported Sitting balance-Leahy Scale: Poor Sitting balance - Comments: once seated EOB continued to use her RUE for support with tendency to lean posteriorly Postural control: Posterior lean                                  Cognition Arousal/Alertness: Lethargic (awakened on arrival; frequent eye closing ) Behavior During Therapy: [facility] for tasks assessed/performed;Anxious (anxious if  try to move RLE) Overall Cognitive Status: No family/caregiver present to determine baseline cognitive functioning                                 General Comments: more lethargic/easily falling asleep during session; able to indicate area of pain for RLE today      Exercises Other Exercises Other Exercises: Attempted LE exercises after return to supine and pt resisted even moving LLE (where yesterday she was cooperative). She stated she wasn't going to do anything  else today.     General Comments        Pertinent Vitals/Pain Pain Assessment: Faces Faces Pain Scale: Hurts whole lot Pain Location: pt points to rt lateral leg, from below knee to lateral thigh Pain Descriptors / Indicators: Guarding;Grimacing;Moaning Pain Intervention(s): Limited activity within patient's tolerance;Monitored during session;Repositioned    Home Living                      Prior Function            PT Goals (current goals can now be found in the care plan section) Acute Rehab PT Goals Patient Stated Goal: "I need to leave" Time For Goal Achievement: 04/30/20 Potential to Achieve Goals: Fair Progress towards PT goals: Progressing toward goals    Frequency    Min 3X/week      PT Plan Current plan remains appropriate    Co-evaluation PT/OT/SLP Co-Evaluation/Treatment: Yes Reason for Co-Treatment: Necessary to address cognition/behavior during functional activity;For patient/therapist safety;To address functional/ADL transfers PT goals addressed during session: Mobility/safety with mobility;Balance;Strengthening/ROM        AM-PAC PT "6 Clicks" Mobility   Outcome Measure  Help needed turning from your back to your side while in a flat bed without using bedrails?: None Help needed moving from lying on your back to sitting on the side of a flat bed without using bedrails?: None Help needed moving to and from a bed to a chair (including a wheelchair)?: Total Help needed standing up from a chair using your arms (e.g., wheelchair or bedside chair)?: Total Help needed to walk in hospital room?: Total Help needed climbing 3-5 steps with a railing? : Total 6 Click Score: 12    End of Session   Activity Tolerance: Patient limited by pain Patient left: in bed;with call bell/phone within reach;with restraints reapplied;with bed alarm set (waist, wrist, mitts) Nurse Communication: Mobility status;Other (comment) (pain in RLE with any movement) PT  Visit Diagnosis: Other abnormalities of gait and mobility (R26.89);Muscle weakness (generalized) (M62.81);Difficulty in walking, not elsewhere classified (R26.2)     Time: 7902-4097 PT Time Calculation (min) (ACUTE ONLY): 34 min  Charges:  $Therapeutic Activity: 8-22 mins                      Arby Barrette, PT Pager [phone]    Rexanne Mano 04/18/2020, 1:37 PM

## 2020-04-19 ENCOUNTER — Inpatient Hospital Stay: Payer: Medicare Other

## 2020-04-19 ENCOUNTER — Ambulatory Visit: Payer: Medicare Other

## 2020-04-19 DIAGNOSIS — U071 COVID-19: Secondary | ICD-10-CM | POA: Diagnosis not present

## 2020-04-19 LAB — CBC WITH DIFFERENTIAL/PLATELET
Abs Immature Granulocytes: 0.07 10*3/uL (ref 0.00–0.07)
Basophils Absolute: 0 10*3/uL (ref 0.0–0.1)
Basophils Relative: 0 %
Eosinophils Absolute: 0 10*3/uL (ref 0.0–0.5)
Eosinophils Relative: 0 %
HCT: 39.2 % (ref 36.0–46.0)
Hemoglobin: 12.3 g/dL (ref 12.0–15.0)
Immature Granulocytes: 1 %
Lymphocytes Relative: 8 %
Lymphs Abs: 1 10*3/uL (ref 0.7–4.0)
MCH: 25.8 pg — ABNORMAL LOW (ref 26.0–34.0)
MCHC: 31.4 g/dL (ref 30.0–36.0)
MCV: 82.2 fL (ref 80.0–100.0)
Monocytes Absolute: 1.3 10*3/uL — ABNORMAL HIGH (ref 0.1–1.0)
Monocytes Relative: 11 %
Neutro Abs: 9.3 10*3/uL — ABNORMAL HIGH (ref 1.7–7.7)
Neutrophils Relative %: 80 %
Platelets: 419 10*3/uL — ABNORMAL HIGH (ref 150–400)
RBC: 4.77 MIL/uL (ref 3.87–5.11)
RDW: 18.6 % — ABNORMAL HIGH (ref 11.5–15.5)
WBC: 11.7 10*3/uL — ABNORMAL HIGH (ref 4.0–10.5)
nRBC: 0 % (ref 0.0–0.2)

## 2020-04-19 LAB — D-DIMER, QUANTITATIVE
D-Dimer, Quant: 2.12 ug/mL-FEU — ABNORMAL HIGH (ref 0.00–0.50)
D-Dimer, Quant: 2.2 ug/mL-FEU — ABNORMAL HIGH (ref 0.00–0.50)

## 2020-04-19 LAB — C-REACTIVE PROTEIN
CRP: 0.7 mg/dL (ref <1.0)
CRP: 0.7 mg/dL (ref <1.0)

## 2020-04-19 LAB — COMPREHENSIVE METABOLIC PANEL
ALT: 28 U/L (ref 0–44)
AST: 29 U/L (ref 15–41)
Albumin: 2.6 g/dL — ABNORMAL LOW (ref 3.5–5.0)
Alkaline Phosphatase: 101 U/L (ref 38–126)
Anion gap: 14 (ref 5–15)
BUN: 28 mg/dL — ABNORMAL HIGH (ref 8–23)
CO2: 21 mmol/L — ABNORMAL LOW (ref 22–32)
Calcium: 9.4 mg/dL (ref 8.9–10.3)
Chloride: 105 mmol/L (ref 98–111)
Creatinine, Ser: 0.9 mg/dL (ref 0.44–1.00)
GFR, Estimated: >60 mL/min (ref >60)
Glucose, Bld: 112 mg/dL — ABNORMAL HIGH (ref 70–99)
Potassium: 3.4 mmol/L — ABNORMAL LOW (ref 3.5–5.1)
Sodium: 140 mmol/L (ref 135–145)
Total Bilirubin: 1 mg/dL (ref 0.3–1.2)
Total Protein: 8.1 g/dL (ref 6.5–8.1)

## 2020-04-19 LAB — MAGNESIUM: Magnesium: 2.1 mg/dL (ref 1.7–2.4)

## 2020-04-19 LAB — BRAIN NATRIURETIC PEPTIDE: B Natriuretic Peptide: 58.4 pg/mL (ref 0.0–100.0)

## 2020-04-19 LAB — PROCALCITONIN: Procalcitonin: <0.1 ng/mL

## 2020-04-19 MED ORDER — HEPARIN (PORCINE) 25000 UT/250ML-% IV SOLN
950.0000 [IU]/h | INTRAVENOUS | Status: DC
  Filled 2020-04-19: qty 250

## 2020-04-19 MED ORDER — IPRATROPIUM-ALBUTEROL 20-100 MCG/ACT IN AERS
2.0000 | INHALATION_SPRAY | Freq: Four times a day (QID) | RESPIRATORY_TRACT | Status: DC | PRN
  Administered 2020-04-19: 2 via RESPIRATORY_TRACT

## 2020-04-19 MED ORDER — PANTOPRAZOLE SODIUM 40 MG IV SOLR
40.0000 mg | Freq: Two times a day (BID) | INTRAVENOUS | Status: DC
  Administered 2020-04-19: 40 mg via INTRAVENOUS
  Filled 2020-04-19: qty 40

## 2020-04-19 MED ORDER — PANTOPRAZOLE SODIUM 40 MG PO TBEC
40.0000 mg | DELAYED_RELEASE_TABLET | Freq: Two times a day (BID) | ORAL | Status: DC
  Filled 2020-04-19: qty 1

## 2020-04-19 MED ORDER — POTASSIUM CHLORIDE 10 MEQ/100ML IV SOLN
10.0000 meq | INTRAVENOUS | Status: AC
  Administered 2020-04-19 (×3): 10 meq via INTRAVENOUS
  Filled 2020-04-19 (×2): qty 100

## 2020-04-19 MED ORDER — POTASSIUM CHLORIDE 10 MEQ/100ML IV SOLN
INTRAVENOUS | Status: AC
  Filled 2020-04-19: qty 100

## 2020-04-19 NOTE — Progress Notes (Signed)
ANTICOAGULATION CONSULT NOTE - Initial Consult  Pharmacy Consult for heparin Indication: DVT  Allergies  Allergen Reactions  . Ampicillin Itching and Nausea And Vomiting  . Other     Muscle relaxer  . Sulfa Antibiotics Itching and Nausea And Vomiting  . Toradol [Ketorolac Tromethamine] Itching and Nausea And Vomiting  . Tramadol Itching and Nausea And Vomiting    Patient Measurements: Height: 5\' 4"  (162.6 cm) Weight: 68 kg (150 lb) IBW/kg (Calculated) : 54.7 Heparin Dosing Weight: 68  Vital Signs: Temp: 97.6 F (36.4 C) (11/24 2103) Temp Source: Oral (11/24 2103) BP: 132/92 (11/24 2103) Pulse Rate: 62 (11/24 2103)  Labs: Recent Labs    04/17/20 0830 04/17/20 0830 04/18/20 0149 04/19/20 0401  HGB 13.5   < > 12.3 12.3  HCT 44.1  --  39.3 39.2  PLT 444*  --  447* 419*  CREATININE 1.02*  --  0.91 0.90   < > = values in this interval not displayed.    Estimated Creatinine Clearance: 48.8 mL/min (by C-G formula based on SCr of 0.9 mg/dL).   Medical History: History reviewed. No pertinent past medical history.  Medications:  Medications Prior to Admission  Medication Sig Dispense Refill Last Dose  . acetaminophen (TYLENOL) 500 MG tablet Take 1,000 mg by mouth in the morning, at noon, and at bedtime.   04/13/2020 at Unknown time  . albuterol (ACCUNEB) 1.25 MG/3ML nebulizer solution Take 1 ampule by nebulization every 6 (six) hours as needed for wheezing or shortness of breath.   unkn  . albuterol (VENTOLIN HFA) 108 (90 Base) MCG/ACT inhaler Inhale 2 puffs into the lungs every 6 (six) hours as needed for wheezing or shortness of breath.   04/13/2020 at Unknown time  . alendronate (FOSAMAX) 70 MG tablet Take 70 mg by mouth every Sunday. Take with a full glass of water on an empty stomach.   Past Week at Unknown time  . ALPRAZolam (XANAX) 1 MG tablet Take 1 mg by mouth at bedtime.   04/12/2020 at Unknown time  . amLODipine (NORVASC) 5 MG tablet Take 5 mg by mouth daily.    04/13/2020 at Unknown time  . apixaban (ELIQUIS) 2.5 MG TABS tablet Take 2.5 mg by mouth 2 (two) times daily.   04/13/2020 at am dose   . bisacodyl (DULCOLAX) 5 MG EC tablet Take 15 mg by mouth daily as needed for moderate constipation.   unkn  . diphenhydrAMINE (BENADRYL) 25 mg capsule Take 25 mg by mouth at bedtime as needed for sleep.   04/12/2020 at Unknown time  . docusate sodium (COLACE) 100 MG capsule Take 100 mg by mouth every morning.   04/13/2020 at Unknown time  . DULoxetine (CYMBALTA) 60 MG capsule Take 60 mg by mouth every morning.   04/13/2020 at Unknown time  . esomeprazole (NEXIUM) 40 MG capsule Take 40 mg by mouth every morning.   04/13/2020 at Unknown time  . fentaNYL (DURAGESIC) 50 MCG/HR Place 1 patch onto the skin every 3 (three) days.   04/13/2020 at Unknown time  . ferrous sulfate 325 (65 FE) MG tablet Take 325 mg by mouth 2 (two) times a week. Tues and Thurs   Past Week at Unknown time  . linaclotide (LINZESS) 290 MCG CAPS capsule Take 290 mcg by mouth daily before breakfast.   04/13/2020 at Unknown time  . lovastatin (MEVACOR) 10 MG tablet Take 10 mg by mouth every morning.   04/13/2020 at Unknown time  . ondansetron (ZOFRAN) 4 MG  tablet Take 4 mg by mouth every 8 (eight) hours as needed for nausea or vomiting.   unkn  . oxyCODONE (OXYCONTIN) 10 mg 12 hr tablet Take 10 mg by mouth every 12 (twelve) hours as needed (breakthrough pain).   04/13/2020 at Unknown time  . polyethylene glycol (MIRALAX / GLYCOLAX) 17 g packet Take 17 g by mouth every morning.   04/13/2020 at Unknown time  . potassium chloride (KLOR-CON) 10 MEQ tablet Take 10 mEq by mouth 2 (two) times daily.   04/13/2020 at Unknown time  . pregabalin (LYRICA) 100 MG capsule Take 100 mg by mouth 2 (two) times daily.   04/13/2020 at Unknown time  . prochlorperazine (COMPAZINE) 10 MG tablet Take 10 mg by mouth every 6 (six) hours as needed for nausea or vomiting.   04/12/2020 at Unknown time  . promethazine (PHENERGAN)  12.5 MG tablet Take 12.5 mg by mouth every 6 (six) hours as needed for nausea or vomiting.   unkn  . traZODone (DESYREL) 50 MG tablet Take 50 mg by mouth at bedtime.   04/12/2020 at Unknown time  . vitamin B-12 (CYANOCOBALAMIN) 500 MCG tablet Take 1,000 mcg by mouth daily.   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  She has been trouble taking medications so IV heparin will be started.  She did receive 2.5 mg this am. Goal of Therapy:  Heparin level 0.3-0.7 units/ml aPTT 66-102 seconds Monitor platelets by anticoagulation protocol: Yes   Plan:  Start heparin drip now at 950 units/hr Check aPTT and heparin level 6-8 hours after start then daily until levels correlate Daily CBC Monitor for bleeding complications  [name] 04/19/2020,10:35 PM

## 2020-04-19 NOTE — Progress Notes (Signed)
Occupational Therapy Treatment Patient Details Name: [name] MRN: [id number] DOB: 1941-02-03 Today's Date: 04/19/2020    History of present illness Pt is a 79 y.o. female dx with COVID-19 on 04/11/20, now admitted 04/13/20 with fall, generalized weakness and confusion. Workup for acute on chronic hypoxic respiratory failure due to COVID PNA, acute toxic encephalopathy. Chest CT with partially necrotic mass in RLL, fibrosis throughout lungs. Head MRI negative for acute abnormality. EEG suggestive of mild diffuse encephalopathy, nonspecific etiology but likely related to toxic-metabolic etiology, anoxic/hypoxic brain injury. 11/22 noted severe pain RLE; MD ordered xray Rt foot, ankle negative (noted pelvic xray 11/18 negative; repeated 11/23 negative).  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 (per daughter has dislocated rt hip x2 since March '21)   OT comments  Pt seen for OT follow up session with focus on ADL mobility progression. Pt presents in restraints and disoriented (per RN staff impulsively trying to get OOB). Pt completed bed mobility with total A +2 and x1 sit <> stand with min A +2 and 4WW before crying out in pain and immediately sitting down. Attempted x2 more stands and pt refusing and not successfully clearing hips. Christmas music played, and pt moving arms, singing and dancing along to tune. Still remained intermittently agitated when crying out for her mother. After sitting EOB ~10 mins pt ?passed out (became unresponsive, heavy posterior lean, eyes closed and no response so stimuli). She required total A +2 for positioning and RN then in to assess. D/c recs remain appropriate, will continue to follow.    Follow Up Recommendations  SNF;Supervision/Assistance - 24 hour    Equipment Recommendations  3 in 1 bedside commode;Wheelchair (measurements OT);Wheelchair cushion (measurements OT)    Recommendations for Other Services       Precautions / Restrictions Precautions Precautions: Fall Restrictions Weight Bearing Restrictions: No Other Position/Activity Restrictions: Pt holding RLE in some internal rotation and knee extension (even when sitting EOB); hx of dislocating R hip       Mobility Bed Mobility Overal bed mobility: Needs Assistance Bed Mobility: Supine to Sit;Sit to Supine     Supine to sit: Total assist;+2 for physical assistance;HOB elevated Sit to supine: +2 for physical assistance;Total assist   General bed mobility comments: With 2 person assist able to pivot pt using bed pad to sit at EOB. Pt easily bent RLE this date  Transfers Overall transfer level: Needs assistance Equipment used: 4-wheeled walker Transfers: Sit to/from Stand Sit to Stand: Min assist;+2 physical assistance         General transfer comment: stood x 1 x 5-10 seconds (never would hold onto rollator); cried out in pain and returned to sitting and reported rt hip pain; attempted 2 more transfers with pt not cooperating and requiring max-total A +2 without success of clearing hips    Balance Overall balance assessment: Needs assistance Sitting-balance support: Feet supported;No upper extremity supported Sitting balance-Leahy Scale: Fair Sitting balance - Comments: using bil UEs to "dance" to christmas music Postural control: Posterior lean Standing balance support: No upper extremity supported Standing balance-Leahy Scale: Poor Standing balance comment: posterior lean                           ADL either performed or assessed with clinical judgement   ADL Overall ADL's : Needs assistance/impaired  Toilet Transfer: Maximal assistance;+2 for physical assistance;+2 for safety/equipment Toilet Transfer Details (indicate cue type and reason): attempted x1 sit <>stand with ~mod A. Pt then becomming agitated and resistant requiring increased assist to begin to initiate  transfer           General ADL Comments: Session focused on EOB ADL activity with music playing to facilitate engagement. Pt singing along to songs, but remains intermittently agitated and non compliant. Further mobility limited due to syncope with pt sitting EOB     Vision Patient Visual Report: No change from baseline     Perception     Praxis      Cognition Arousal/Alertness: Awake/alert Behavior During Therapy: WFL for tasks assessed/performed;Anxious;Restless Overall Cognitive Status: No family/caregiver present to determine baseline cognitive functioning                                 General Comments: pt not oriented, baseline dementia. Very fixated on crying out for her mother. Responded well to happy/peppy songs about christmas        Exercises     Shoulder Instructions       General Comments      Pertinent Vitals/ Pain       Pain Assessment: Faces Faces Pain Scale: Hurts whole lot Pain Location: rt hip in weight-bearing Pain Descriptors / Indicators: Guarding;Grimacing;Moaning Pain Intervention(s): Limited activity within patient's tolerance;Monitored during session;Repositioned  Home Living                                          Prior Functioning/Environment              Frequency  Min 2X/week        Progress Toward Goals  OT Goals(current goals can now be found in the care plan section)  Progress towards OT goals: Progressing toward goals  Acute Rehab OT Goals Patient Stated Goal: unable to state OT Goal Formulation: Patient unable to participate in goal setting Time For Goal Achievement: 04/29/20 Potential to Achieve Goals: Good  Plan Discharge plan remains appropriate    Co-evaluation    PT/OT/SLP Co-Evaluation/Treatment: Yes Reason for Co-Treatment: For patient/therapist safety;To address functional/ADL transfers   OT goals addressed during session: ADL's and self-care;Proper use of Adaptive  equipment and DME;Strengthening/ROM      AM-PAC OT "6 Clicks" Daily Activity     Outcome Measure   Help from another person eating meals?: A Lot Help from another person taking care of personal grooming?: A Lot Help from another person toileting, which includes using toliet, bedpan, or urinal?: Total Help from another person bathing (including washing, rinsing, drying)?: A Lot Help from another person to put on and taking off regular upper body clothing?: A Lot Help from another person to put on and taking off regular lower body clothing?: Total 6 Click Score: 10    End of Session    OT Visit Diagnosis: Unsteadiness on feet (R26.81);Other abnormalities of gait and mobility (R26.89);Muscle weakness (generalized) (M62.81);Other symptoms and signs involving cognitive function;Pain Pain - Right/Left: Right Pain - part of body: Hip;Leg   Activity Tolerance Patient limited by fatigue   Patient Left in bed;with call bell/phone within reach;with bed alarm set   Nurse Communication Mobility status;Other (comment) (pt ?passed out)        Time: 9767-3419 OT Time  Calculation (min): 51 min  Charges: OT General Charges $OT Visit: 1 Visit OT Treatments $Self Care/Home Management : 8-22 mins  Zenovia Jarred, MSOT, OTR/L [facility] Office Number: [phone] Pager: [phone]  Zenovia Jarred 04/19/2020, 4:21 PM

## 2020-04-19 NOTE — Progress Notes (Signed)
Physical Therapy Treatment Patient Details Name: [name] MRN: [id number] DOB: 12/10/1940 Today's Date: 04/19/2020    History of Present Illness Pt is a 79 y.o. female dx with COVID-19 on 04/11/20, now admitted 04/13/20 with fall, generalized weakness and confusion. Workup for acute on chronic hypoxic respiratory failure due to COVID PNA, acute toxic encephalopathy. Chest CT with partially necrotic mass in RLL, fibrosis throughout lungs. Head MRI negative for acute abnormality. EEG suggestive of mild diffuse encephalopathy, nonspecific etiology but likely related to toxic-metabolic etiology, anoxic/hypoxic brain injury. 11/22 noted severe pain RLE; MD ordered xray Rt foot, ankle negative (noted pelvic xray 11/18 negative; repeated 11/23 negative).  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 (per daughter has dislocated rt hip x2 since March '21)    PT Comments    Patient more cooperative today with less RLE pain, however as soon as she stood up she cried out and returned to sitting stating pain in her right hip. Could not persuade her to try standing again. After 25 minutes of sitting EOB engaging in some activities, she suddenly slumped backwards and passed out. Returned to supine and pt not initially responding. HR 64. Responded to sternal rub and BP 120/107. After several minutes in supine BP 138/93 with pt still less responsive. RN in to assess pt.    Follow Up Recommendations  Home health PT;Supervision/Assistance - 24 hour (daughter refusing SNF; has cared for pt at home)     Equipment Recommendations  Other (comment) (?wheelchair, although ?home accessible)    Recommendations for Other Services       Precautions / Restrictions Precautions Precautions: Fall Precaution Comments: confusion Restrictions Weight Bearing Restrictions: No    Mobility  Bed Mobility Overal bed mobility: Needs Assistance Bed Mobility: Supine to  Sit;Sit to Supine     Supine to sit: Total assist;+2 for physical assistance;HOB elevated Sit to supine: +2 for physical assistance;Total assist (pt ?passed out at EOB and returned to supine)   General bed mobility comments: With 2 person assist able to pivot pt using bed pad to sit at EOB. Pt easily bent RLE this date  Transfers Overall transfer level: Needs assistance Equipment used: 4-wheeled walker Transfers: Sit to/from Stand Sit to Stand: Min assist;+2 physical assistance         General transfer comment: stood x 1 x 5-10 seconds (never would hold onto rollator); cried out in pain and returned to sitting and reported rt hip pain; attempted 2 more transfers with pt not cooperating  Ambulation/Gait                 Stairs             Wheelchair Mobility    Modified Rankin (Stroke Patients Only)       Balance Overall balance assessment: Needs assistance Sitting-balance support: Feet supported;No upper extremity supported Sitting balance-Leahy Scale: Fair Sitting balance - Comments: using bil UEs to "dance" to christmas music   Standing balance support: No upper extremity supported Standing balance-Leahy Scale: Poor Standing balance comment: posterior lean                            Cognition Arousal/Alertness: Awake/alert Behavior During Therapy: WFL for tasks assessed/performed;Anxious (anxious and crying for her mother at times) Overall Cognitive Status: No family/caregiver present to determine baseline cognitive functioning  Exercises      General Comments General comments (skin integrity, edema, etc.): Patient at EOB ~ 25 minutes while utilizing music and speaking of familiar people and her dogs to try to motivate her to get to recliner. Patient then suddenly collapsed backwards against therapist, eyes closed and limbs limp. Returned to supine and pt not initially responding. HR  64. Responded to sternal rub and BP 120/107. After several minutes in supine BP 138/93 with pt still less responsive. RN in to assess pt.       Pertinent Vitals/Pain Pain Assessment: Faces Faces Pain Scale: Hurts whole lot Pain Location: rt hip in weight-bearing Pain Descriptors / Indicators: Guarding;Grimacing;Moaning Pain Intervention(s): Limited activity within patient's tolerance;Monitored during session;Repositioned    Home Living                      Prior Function            PT Goals (current goals can now be found in the care plan section) Acute Rehab PT Goals Patient Stated Goal: unable to state Time For Goal Achievement: 04/30/20 Potential to Achieve Goals: Fair Progress towards PT goals: Progressing toward goals    Frequency    Min 3X/week      PT Plan Current plan remains appropriate    Co-evaluation PT/OT/SLP Co-Evaluation/Treatment: Yes Reason for Co-Treatment: Necessary to address cognition/behavior during functional activity;For patient/therapist safety;To address functional/ADL transfers PT goals addressed during session: Mobility/safety with mobility;Balance        AM-PAC PT "6 Clicks" Mobility   Outcome Measure  Help needed turning from your back to your side while in a flat bed without using bedrails?: None Help needed moving from lying on your back to sitting on the side of a flat bed without using bedrails?: None Help needed moving to and from a bed to a chair (including a wheelchair)?: Total Help needed standing up from a chair using your arms (e.g., wheelchair or bedside chair)?: Total Help needed to walk in hospital room?: Total Help needed climbing 3-5 steps with a railing? : Total 6 Click Score: 12    End of Session Equipment Utilized During Treatment: Gait belt (used waist restraint pt already wearing) Activity Tolerance: Patient limited by pain Patient left: in bed;with call bell/phone within reach;with restraints  reapplied;with bed alarm set (waist, wrist, mitts) Nurse Communication: Mobility status;Other (comment) (passed out during session) PT Visit Diagnosis: Other abnormalities of gait and mobility (R26.89);Muscle weakness (generalized) (M62.81);Difficulty in walking, not elsewhere classified (R26.2)     Time: 9381-0175 PT Time Calculation (min) (ACUTE ONLY): 51 min  Charges:  $Therapeutic Activity: 23-37 mins                      Arby Barrette, PT Pager [phone]    Rexanne Mano 04/19/2020, 1:03 PM

## 2020-04-19 NOTE — Care Management (Signed)
Benefit check for oral zyxov. It is listed on [place] Medicaid preferred drug list and will be covered at $4.

## 2020-04-19 NOTE — Progress Notes (Signed)
Called by RN.  Patient is not taking her p.o. medications and requests medication that she needs to be changed to another route.  Protonix was changed to IV twice a day.  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  Nurse reports that patient has been intermittently getting her Eliquis dose over the last few days but did not get it on the morning of April 19, 2020. Patient is currently sleeping and heart rate is 60 so we will need to address metoprolol ministration when patient wakes up in the morning did not want to give an IV dose of metoprolol now with blood pressure is stable and heart rate is 60.

## 2020-04-19 NOTE — Progress Notes (Signed)
PROGRESS NOTE                                                                                                                                                                                                             Patient Demographics:    [name], is a 79 y.o. female, DOB - [birth year], ZES:[id number]  Outpatient Primary MD for the patient is [name], FNP   Admit date - 04/13/2020   LOS - 5  Chief Complaint  Patient presents with  . Fall       Brief Narrative: Patient is a 79 y.o. 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  COVID-19 vaccinated status: Unvaccinated  Significant Events: 11/18>> Admit to [facility] for weakness/confusion/COVID-19 pneumonia  Significant studies: 11/18>> chest x-ray: Diffuse interstitial prominence throughout both lungs compatible with chronic lung disease/fibrosis-no definite airspace opacity 11/18>> pelvic x-ray: No acute bony abnormality. 11/18>> CT head/C-spine: No acute abnormality in head/no fractures in C-spine 11/19>> CT chest: Partially necrotic mass in the right lower lobe-fibrosis throughout both lungs.  CAD. 11/21>> EEG - This study is suggestive of mild diffuse encephalopathy, nonspecific etiology but likely related to toxic-metabolic etiology, anoxic/hypoxic brain injury. No seizures or epileptiform discharges were seen throughout the recording. 11/22 >> MRI  COVID-19 medications: Steroids: 11/18>> Remdesivir: 11/18>> Monoclonal antibody: 11/16 x 1  Antibiotics: None  Microbiology data: 11/20>> blood cultures: Ordered 11/18 >>blood culture: Gram-positive cocci (BC ID-staph species-could be a contaminant) 11/18>> urine culture: <10,007  colonies/mL-insignificant growth  Procedures: None  Consults: None  DVT prophylaxis: apixaban (ELIQUIS) tablet 2.5 mg Start: 04/13/20 2230 apixaban (ELIQUIS) tablet 2.5 mg    Subjective:   Patient in bed, no headache, no chest pain, more awake alert today.  Mildly confused.   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  Stable, on 2 L nasal cannula oxygen which she wears at home as well.  Fever: afebrile O2 requirements:  SpO2: 91 % O2  Flow Rate (L/min): 2 L/min     Recent Labs  Lab 04/13/20 1855 04/14/20 0153 04/15/20 0123 04/15/20 0123 04/16/20 0352 04/17/20 0830 04/18/20 0149 04/19/20 0048 04/19/20 0401  WBC 10.5   < > 6.4  --  12.7* 9.8 11.7*  --  11.7*  HGB 8.6*   < > 11.3*  --  11.6* 13.5 12.3  --  12.3  HCT 29.1*   < > 37.2  --  37.1 44.1 39.3  --  39.2  PLT 426*   < > 329  --  351 444* 447*  --  419*  CRP 12.6*   < > 7.6*   < > 4.2* 3.1* 1.5* 0.7 0.7  BNP  --   --   --   --  103.8* 80.4 67.4  --  58.4  DDIMER 3.28*   < > 3.26*   < > 2.87* 2.87* 1.95* 2.20* 2.12*  PROCALCITON <0.10  --   --   --   --  <0.10 <0.10  --  <0.10  AST 34   < > 77*  --  61* 45* 31  --  29  ALT 13   < > 31  --  35 37 32  --  28  ALKPHOS 90   < > 131*  --  116 119 109  --  101  BILITOT 0.5   < > 0.7  --  1.0 1.1 1.0  --  1.0  ALBUMIN 2.6*   < > 2.6*  --  2.7* 2.9* 2.7*  --  2.6*   < > = values in this interval not displayed.     COPD exacerbation: Improving-continue steroids and bronchodilators.  On home nocturnal O2.  Gram-positive bacteremia: BC x 2 different dates 18th - 20th Staph Hominis - no clear source.  Does have a Port-A-Cath, site looks clean, she was on IV vancomycin and now switched to Zyvox orally on 04/18/2020.  Will repeat 2  sets of blood cultures again on 04/19/2020, if they are negative with negative procalcitonin 2 weeks of Zyvox should suffice.  If repeat cultures are positive then formal ID consult and possibly removal of Port-A-Cath.  Acute toxic encephalopathy: Most likely COVID-19 inflammation related encephalopathy, CT, MRI and EEG all nonacute.  No focal deficits.  Much improved on 04/19/2020 after we discontinued all possible sedating medications which include Seroquel, Lyrica, duloxetine and monitor.  As needed Haldol for agitation if needed. Improved.  Fall: Secondary debility/deconditioning at baseline-superimposed weakness due to COVID-19 infection.  Await PT/OT eval.  Anxiety/depression: Have restarted Xanax/Cymbalta-on Seroquel nightly starting today for severe delirium/encephalopathy.  Reassess tomorrow.    Sinus tachycardia: Mild-fluctuating heart rate-mostly gets tachycardic when she is agitated.  Patient is pulling out her telemetry leads-and will not let nursing staff patient back again.  We will go and discontinue telemetry for now-may help keep her calm and manage her delirium/encephalopathy.  Chronic pain syndrome-opiate dependent: Apparently recently started on transdermal fentanyl-this remains on hold-given few doses of IV morphine yesterday without any improvement in her mental status.    Squamous cell carcinoma of the lung-getting radiation treatments-resume outpatient follow-up with radiation oncology/medical oncology-could have some amount of fibrosis/inflammation in the lungs due to radiation.  On steroids.  Per daughter-patient on radiation treatment as was deemed to be a poor candidate for chemotherapy due to overall medical comorbidities.  Normocytic anemia: Hemoglobin admission was probably a lab error-no evidence of any GI loss-repeat hemoglobin this morning back to baseline.  Follow  CBC.   History of VTE: Continue Eliquis  HTN: BP high, unable to take oral medications consistently,  added Nitropaste and as needed hydralazine.  Monitor.  HLD: Continue statin  GERD: Continue PPI  Goals of care: Long discussion with patient's daughter over the phone on several days and again on 04/18/2020, DNR, gentle medical measures. If further decline full comfort care.   GI prophylaxis: PPI   Condition - Stable  Family Communication  : Daughter [name] [phone]) updated over the phone on 04/16/20, 04/17/20, 04/18/20 new present level of care if further decline comfort measures.  Updated daughter on 04/19/2020.  Code Status :  Full Code  Diet :  Diet Order            Diet 2 gram sodium Room service appropriate? Yes; Fluid consistency: Thin  Diet effective now                  Disposition Plan  :  Status is: Inpatient  The patient will require care spanning > 2 midnights and should be moved to inpatient because: Inpatient level of care appropriate due to severity of illness  Dispo: The patient is from: Home              Anticipated d/c is to: TBD              Anticipated d/c date is: 2 days              Patient currently is not medically stable to d/c.   Barriers to discharge: Hypoxia requiring O2 supplementation/complete 5 days of IV Remdesivir  Antimicorbials  :    Anti-infectives (From admission, onward)   Start     Dose/Rate Route Frequency Ordered Stop   04/18/20 2200  linezolid (ZYVOX) tablet 600 mg        600 mg Oral Every 12 hours 04/18/20 1624 04/30/20 0959   04/17/20 1130  cefTRIAXone (ROCEPHIN) 1 g in sodium chloride 0.9 % 100 mL IVPB        1 g 200 mL/hr over 30 Minutes Intravenous Every 24 hours 04/17/20 1039 04/20/20 1129   04/16/20 2000  vancomycin (VANCOREADY) IVPB 750 mg/150 mL  Status:  Discontinued       "Followed by" Linked Group Details   750 mg 150 mL/hr over 60 Minutes Intravenous Every 24 hours 04/15/20 1647 04/18/20 1624   04/16/20 1700  vancomycin (VANCOREADY) IVPB 750 mg/150 mL  Status:  Discontinued       "Followed by"  Linked Group Details   750 mg 150 mL/hr over 60 Minutes Intravenous Every 24 hours 04/15/20 1403 04/15/20 1647   04/15/20 2000  vancomycin (VANCOCIN) IVPB 1000 mg/200 mL premix       "Followed by" Linked Group Details   1,000 mg 200 mL/hr over 60 Minutes Intravenous  Once 04/15/20 1647 04/16/20 0223   04/15/20 1700  vancomycin (VANCOCIN) IVPB 1000 mg/200 mL premix  Status:  Discontinued       "Followed by" Linked Group Details   1,000 mg 200 mL/hr over 60 Minutes Intravenous  Once 04/15/20 1403 04/15/20 1647   04/14/20 1000  remdesivir 100 mg in sodium chloride 0.9 % 100 mL IVPB       "Followed by" Linked Group Details   100 mg 200 mL/hr over 30 Minutes Intravenous Daily 04/13/20 2227 04/17/20 1555   04/13/20 2330  remdesivir 200 mg in sodium chloride 0.9% 250 mL IVPB       "Followed by" Linked  Group Details   200 mg 580 mL/hr over 30 Minutes Intravenous Once 04/13/20 2227 04/14/20 0254      Inpatient Medications  Scheduled Meds: . apixaban  2.5 mg Oral BID  . vitamin C  500 mg Oral Daily  . benzonatate  200 mg Oral TID  . dexamethasone (DECADRON) injection  6 mg Intravenous Q24H  . linezolid  600 mg Oral Q12H  . metoprolol tartrate  50 mg Oral BID  . nitroGLYCERIN  1 inch Topical Q6H  . pantoprazole (PROTONIX) IV  40 mg Intravenous Q12H  . pravastatin  10 mg Oral q1800  . zinc sulfate  220 mg Oral Daily   Continuous Infusions: . cefTRIAXone (ROCEPHIN)  IV Stopped (04/18/20 2209)  . potassium chloride     PRN Meds:.acetaminophen, albuterol, haloperidol lactate, hydrALAZINE, Ipratropium-Albuterol, [DISCONTINUED] ondansetron **OR** ondansetron (ZOFRAN) IV, polyethylene glycol   Time Spent in minutes  25  See all Orders from today for further details   Lala Lund M.D on 04/19/2020 at 10:33 AM  To page go to [email or web address] - use universal password  [facility] -  Office  [phone]    Objective:   Vitals:   04/18/20 1600 04/18/20 1828 04/18/20 2055  04/19/20 0500  BP: (!) 149/98 (!) 143/92 (!) 157/97 (!) 162/86  Pulse:  99 100 74  Resp:  19 18 17   Temp:  97.6 F (36.4 C) 98.6 F (37 C) 98.6 F (37 C)  TempSrc:  Oral Oral Oral  SpO2: 92% 92% 91% 91%  Weight:      Height:        Wt Readings from Last 3 Encounters:  04/13/20 68 kg     Intake/Output Summary (Last 24 hours) at 04/19/2020 1033 Last data filed at 04/19/2020 0800 Gross per 24 hour  Intake 509.75 ml  Output 800 ml  Net -290.25 ml     Physical Exam  Awake mildly confused but much more awake today, no focal deficits [place].AT,PERRAL Supple Neck,No JVD, No cervical lymphadenopathy appriciated.  Symmetrical Chest wall movement, Good air movement bilaterally, CTAB RRR,No Gallops, Rubs or new Murmurs, No Parasternal Heave +ve B.Sounds, Abd Soft, No tenderness, No organomegaly appriciated, No rebound - guarding or rigidity. No Cyanosis, Clubbing or edema, No new Rash or bruise     Data Review:    CBC Recent Labs  Lab 04/15/20 0123 04/16/20 0352 04/17/20 0830 04/18/20 0149 04/19/20 0401  WBC 6.4 12.7* 9.8 11.7* 11.7*  HGB 11.3* 11.6* 13.5 12.3 12.3  HCT 37.2 37.1 44.1 39.3 39.2  PLT 329 351 444* 447* 419*  MCV 83.4 83.2 82.9 82.6 82.2  MCH 25.3* 26.0 25.4* 25.8* 25.8*  MCHC 30.4 31.3 30.6 31.3 31.4  RDW 18.6* 19.1* 19.3* 18.6* 18.6*  LYMPHSABS 0.3* 0.4* 0.5* 0.8 1.0  MONOABS 0.2 0.6 0.5 1.0 1.3*  EOSABS 0.0 0.0 0.0 0.0 0.0  BASOSABS 0.0 0.0 0.0 0.0 0.0    Chemistries  Recent Labs  Lab 04/15/20 0123 04/16/20 0352 04/17/20 0830 04/18/20 0149 04/19/20 0401  NA 141 140 140 138 140  K 3.7 3.2* 3.5 3.2* 3.4*  CL 107 104 105 102 105  CO2 22 22 21* 20* 21*  GLUCOSE 123* 108* 99 105* 112*  BUN 9 14 21  27* 28*  CREATININE 0.77 0.88 1.02* 0.91 0.90  CALCIUM 9.6 9.2 9.9 9.4 9.4  MG 2.0 1.9 2.0 2.0 2.1  AST 77* 61* 45* 31 29  ALT 31 35 37 32 28  ALKPHOS 131* 116 119 109 101  BILITOT 0.7 1.0 1.1 1.0 1.0    ------------------------------------------------------------------------------------------------------------------ No results for input(s): CHOL, HDL, LDLCALC, TRIG, CHOLHDL, LDLDIRECT in the last 72 hours.  No results found for: HGBA1C ------------------------------------------------------------------------------------------------------------------ No results for input(s): TSH, T4TOTAL, T3FREE, THYROIDAB in the last 72 hours.  Invalid input(s): FREET3 ------------------------------------------------------------------------------------------------------------------ No results for input(s): VITAMINB12, FOLATE, FERRITIN, TIBC, IRON, RETICCTPCT in the last 72 hours.  Coagulation profile No results for input(s): INR, PROTIME in the last 168 hours.  Recent Labs    04/19/20 0048 04/19/20 0401  DDIMER 2.20* 2.12*    Cardiac Enzymes No results for input(s): CKMB, TROPONINI, MYOGLOBIN in the last 168 hours.  Invalid input(s): CK ------------------------------------------------------------------------------------------------------------------    Component Value Date/Time   BNP 58.4 04/19/2020 0401    Micro Results Recent Results (from the past 240 hour(s))  Culture, Urine     Status: Abnormal   Collection Time: 04/13/20  6:59 AM   Specimen: Urine, Random  Result Value Ref Range Status   Specimen Description URINE, RANDOM  Final   Special Requests NONE  Final   Culture (A)  Final    <10,000 COLONIES/mL INSIGNIFICANT GROWTH Performed at [facility], [address], [place], [place] [address]    Report Status 04/15/2020 FINAL  Final  Blood Culture (routine x 2)     Status: Abnormal   Collection Time: 04/13/20  7:00 PM   Specimen: BLOOD  Result Value Ref Range Status   Specimen Description BLOOD SITE NOT SPECIFIED  Final   Special Requests   Final    BOTTLES DRAWN AEROBIC ONLY Blood Culture results may not be optimal due to an inadequate volume of blood received in  culture bottles   Culture  Setup Time   Final    GRAM POSITIVE COCCI AEROBIC BOTTLE ONLY Organism ID to follow CRITICAL RESULT CALLED TO, READ BACK BY AND VERIFIED WITH: PHARMD M Morley 04/14/20 AT 1853 SK    Culture (A)  Final    STAPHYLOCOCCUS HOMINIS THE SIGNIFICANCE OF ISOLATING THIS ORGANISM FROM A SINGLE SET OF BLOOD CULTURES WHEN MULTIPLE SETS ARE DRAWN IS UNCERTAIN. PLEASE NOTIFY THE MICROBIOLOGY DEPARTMENT WITHIN ONE WEEK IF SPECIATION AND SENSITIVITIES ARE REQUIRED. Performed at [facility], [place] [address], [place], [place] [address]    Report Status 04/15/2020 FINAL  Final  Blood Culture ID Panel (Reflexed)     Status: Abnormal   Collection Time: 04/13/20  7:00 PM  Result Value Ref Range Status   Enterococcus faecalis NOT DETECTED NOT DETECTED Final   Enterococcus Faecium NOT DETECTED NOT DETECTED Final   Listeria monocytogenes NOT DETECTED NOT DETECTED Final   Staphylococcus species DETECTED (A) NOT DETECTED Final    Comment: CRITICAL RESULT CALLED TO, READ BACK BY AND VERIFIED WITH: PHARMD M MACCIA 04/14/20 AT 1853 SK    Staphylococcus aureus (BCID) NOT DETECTED NOT DETECTED Final   Staphylococcus epidermidis NOT DETECTED NOT DETECTED Final   Staphylococcus lugdunensis NOT DETECTED NOT DETECTED Final   Streptococcus species NOT DETECTED NOT DETECTED Final   Streptococcus agalactiae NOT DETECTED NOT DETECTED Final   Streptococcus pneumoniae NOT DETECTED NOT DETECTED Final   Streptococcus pyogenes NOT DETECTED NOT DETECTED Final   A.calcoaceticus-baumannii NOT DETECTED NOT DETECTED Final   Bacteroides fragilis NOT DETECTED NOT DETECTED Final   Enterobacterales NOT DETECTED NOT DETECTED Final   Enterobacter cloacae complex NOT DETECTED NOT DETECTED Final   Escherichia coli NOT DETECTED NOT DETECTED Final   Klebsiella aerogenes NOT DETECTED NOT DETECTED Final  Klebsiella oxytoca NOT DETECTED NOT DETECTED Final   Klebsiella pneumoniae NOT DETECTED NOT DETECTED  Final   Proteus species NOT DETECTED NOT DETECTED Final   Salmonella species NOT DETECTED NOT DETECTED Final   Serratia marcescens NOT DETECTED NOT DETECTED Final   Haemophilus influenzae NOT DETECTED NOT DETECTED Final   Neisseria meningitidis NOT DETECTED NOT DETECTED Final   Pseudomonas aeruginosa NOT DETECTED NOT DETECTED Final   Stenotrophomonas maltophilia NOT DETECTED NOT DETECTED Final   Candida albicans NOT DETECTED NOT DETECTED Final   Candida auris NOT DETECTED NOT DETECTED Final   Candida glabrata NOT DETECTED NOT DETECTED Final   Candida krusei NOT DETECTED NOT DETECTED Final   Candida parapsilosis NOT DETECTED NOT DETECTED Final   Candida tropicalis NOT DETECTED NOT DETECTED Final   Cryptococcus neoformans/gattii NOT DETECTED NOT DETECTED Final    Comment: Performed at [facility], [place] [address], [place], [place] [address]  Culture, blood (routine x 2)     Status: Abnormal   Collection Time: 04/15/20  6:06 PM   Specimen: BLOOD LEFT ARM  Result Value Ref Range Status   Specimen Description BLOOD LEFT ARM  Final   Special Requests   Final    BOTTLES DRAWN AEROBIC ONLY Blood Culture results may not be optimal due to an inadequate volume of blood received in culture bottles   Culture  Setup Time   Final    GRAM POSITIVE COCCI IN CLUSTERS AEROBIC BOTTLE ONLY CRITICAL VALUE NOTED.  VALUE IS CONSISTENT WITH PREVIOUSLY REPORTED AND CALLED VALUE. Performed at [facility], [place] [address], [place], [place] [address]    Culture STAPHYLOCOCCUS HOMINIS (A)  Final   Report Status 04/18/2020 FINAL  Final   Organism ID, Bacteria STAPHYLOCOCCUS HOMINIS  Final      Susceptibility   Staphylococcus hominis - MIC*    CIPROFLOXACIN >=8 RESISTANT Resistant     ERYTHROMYCIN >=8 RESISTANT Resistant     GENTAMICIN <=0.5 SENSITIVE Sensitive     OXACILLIN >=4 RESISTANT Resistant     TETRACYCLINE >=16 RESISTANT Resistant     VANCOMYCIN <=0.5 SENSITIVE Sensitive      TRIMETH/SULFA 80 RESISTANT Resistant     CLINDAMYCIN RESISTANT Resistant     RIFAMPIN <=0.5 SENSITIVE Sensitive     Inducible Clindamycin POSITIVE Resistant     LINEZOLID Value in next row Sensitive      SENSITIVE2    * STAPHYLOCOCCUS HOMINIS  Culture, blood (routine x 2)     Status: None (Preliminary result)   Collection Time: 04/15/20  6:45 PM   Specimen: BLOOD  Result Value Ref Range Status   Specimen Description BLOOD SITE NOT SPECIFIED  Final   Special Requests   Final    BOTTLES DRAWN AEROBIC AND ANAEROBIC Blood Culture adequate volume   Culture   Final    NO GROWTH 4 DAYS Performed at [facility], [address], [place],  [address]    Report Status PENDING  Incomplete    Radiology Reports CT Head Wo Contrast  Result Date: 04/13/2020 CLINICAL DATA:  79 year old female with head trauma. EXAM: CT HEAD WITHOUT CONTRAST CT CERVICAL SPINE WITHOUT CONTRAST TECHNIQUE: Multidetector CT imaging of the head and cervical spine was performed following the standard protocol without intravenous contrast. Multiplanar CT image reconstructions of the cervical spine were also generated. COMPARISON:  CT dated 08/28/2019. FINDINGS: CT HEAD FINDINGS Brain: Mild age-related atrophy and chronic microvascular ischemic changes. There is no acute intracranial hemorrhage. No mass  effect or midline shift. No extra-axial fluid collection. Vascular: No hyperdense vessel or unexpected calcification. Skull: Normal. Negative for fracture or focal lesion. Sinuses/Orbits: No acute finding. Other: None CT CERVICAL SPINE FINDINGS Alignment: No acute subluxation. Skull base and vertebrae: No acute fracture. Osteopenia. Soft tissues and spinal canal: No prevertebral fluid or swelling. No visible canal hematoma. Disc levels:  Degenerative changes. No acute findings. Upper chest: Small left upper lobe calcified granuloma. Partially visualized right subclavian central venous line. Other: Bilateral carotid bulb  calcified plaques. IMPRESSION: 1. No acute intracranial pathology. Mild age-related atrophy and chronic microvascular ischemic changes. 2. No acute/traumatic cervical spine pathology. Electronically Signed   By: Anner Crete M.D.   On: 04/13/2020 20:44   CT CHEST WO CONTRAST  Result Date: 04/14/2020 CLINICAL DATA:  Respiratory illness EXAM: CT CHEST WITHOUT CONTRAST TECHNIQUE: Multidetector CT imaging of the chest was performed following the standard protocol without IV contrast. COMPARISON:  Chest x-ray 04/13/2020.  CT 04/11/2020 FINDINGS: Cardiovascular: Aortic atherosclerosis. Scattered coronary artery calcifications. Heart is normal size and aorta is normal caliber. Mediastinum/Nodes: Small scattered non pathologically enlarged mediastinal lymph nodes, stable. No mediastinal, hilar, or axillary adenopathy. Trachea and esophagus are unremarkable. Thyroid unremarkable. Lungs/Pleura: Peripheral interstitial prominence in thickening compatible with fibrosis, stable. Rounded partially necrotic mass in the posterior right lower lobe measures 3.1 cm and is slightly decreased in size since prior study when this measured 3.8 cm. No effusions. No confluent opacities on the left. Calcified granuloma in the left upper lobe. Upper Abdomen: Imaging into the upper abdomen demonstrates no acute findings. Musculoskeletal: Chest wall soft tissues are unremarkable. Right upper chest wall Port-A-Cath remains in place, unchanged. No acute bony abnormality. Old posterior right 7th rib fracture and upper thoracic compression fractures, unchanged. IMPRESSION: Partially necrotic mass in the posterior right lower lobe is again noted, slightly decreased in size since prior study, 3.1 cm currently compared to 3.8 cm previously. Changes of fibrosis throughout the lungs. Coronary artery disease. Aortic Atherosclerosis (ICD10-I70.0). Electronically Signed   By: Rolm Baptise M.D.   On: 04/14/2020 18:33   CT Cervical Spine Wo  Contrast  Result Date: 04/13/2020 CLINICAL DATA:  79 year old female with head trauma. EXAM: CT HEAD WITHOUT CONTRAST CT CERVICAL SPINE WITHOUT CONTRAST TECHNIQUE: Multidetector CT imaging of the head and cervical spine was performed following the standard protocol without intravenous contrast. Multiplanar CT image reconstructions of the cervical spine were also generated. COMPARISON:  CT dated 08/28/2019. FINDINGS: CT HEAD FINDINGS Brain: Mild age-related atrophy and chronic microvascular ischemic changes. There is no acute intracranial hemorrhage. No mass effect or midline shift. No extra-axial fluid collection. Vascular: No hyperdense vessel or unexpected calcification. Skull: Normal. Negative for fracture or focal lesion. Sinuses/Orbits: No acute finding. Other: None CT CERVICAL SPINE FINDINGS Alignment: No acute subluxation. Skull base and vertebrae: No acute fracture. Osteopenia. Soft tissues and spinal canal: No prevertebral fluid or swelling. No visible canal hematoma. Disc levels:  Degenerative changes. No acute findings. Upper chest: Small left upper lobe calcified granuloma. Partially visualized right subclavian central venous line. Other: Bilateral carotid bulb calcified plaques. IMPRESSION: 1. No acute intracranial pathology. Mild age-related atrophy and chronic microvascular ischemic changes. 2. No acute/traumatic cervical spine pathology. Electronically Signed   By: Anner Crete M.D.   On: 04/13/2020 20:44   MR BRAIN WO CONTRAST  Result Date: 04/17/2020 CLINICAL DATA:  Coronavirus infection. Altered mental status. History of squamous cell carcinoma of the lung. EXAM: MRI HEAD WITHOUT CONTRAST TECHNIQUE: Multiplanar, multiecho pulse sequences  of the brain and surrounding structures were obtained without intravenous contrast. COMPARISON:  Head CT 04/13/2020.  MRI 03/04/2020. FINDINGS: Brain: Diffusion imaging does not show any acute or subacute infarction or other cause of restricted  diffusion. No abnormality affects the brainstem or cerebellum. Cerebral hemispheres show age related atrophy with mild chronic small-vessel ischemic changes of the white matter, often seen at this age. No cortical or large vessel territory infarction. No mass lesion, hemorrhage, hydrocephalus or extra-axial collection. Vascular: Major vessels at the base of the brain show flow. Skull and upper cervical spine: Negative Sinuses/Orbits: Sinuses are clear except for a small amount of fluid layering in the left division of the sphenoid sinus, not likely significant. Orbits negative. Previous lens implant on right Other: None IMPRESSION: No acute finding. Age related atrophy. 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 Electronically Signed   By: Nelson Chimes M.D.   On: 04/17/2020 10:50   DG Pelvis Portable  Result Date: 04/13/2020 CLINICAL DATA:  Fall EXAM: PORTABLE PELVIS 1-2 VIEWS COMPARISON:  None. FINDINGS: Prior right hip replacement. Normal AP alignment. Mild degenerative changes within the left hip. SI joints symmetric and unremarkable. No acute bony abnormality. Specifically, no fracture, subluxation, or dislocation. IMPRESSION: No acute bony abnormality. Electronically Signed   By: Rolm Baptise M.D.   On: 04/13/2020 19:21   DG Chest Port 1 View  Result Date: 04/13/2020 CLINICAL DATA:  Fall, COVID EXAM: PORTABLE CHEST 1 VIEW COMPARISON:  04/11/2020 FINDINGS: Right Port-A-Cath remains in place, unchanged. Heart is normal size. Low lung volumes. Diffuse interstitial prominence throughout the lungs compatible with chronic lung disease/fibrosis. No definite acute confluent airspace opacity. Previously seen rounded mass posteriorly in the right lung better seen on prior CT. No effusions. IMPRESSION: Stable appearance of the chest. Electronically Signed   By: Rolm Baptise M.D.   On: 04/13/2020 19:21   DG Ankle Right Port  Result Date: 04/17/2020 CLINICAL DATA:   79 year old female with soft tissue swelling of the lateral malleolus. No known injury. EXAM: PORTABLE RIGHT ANKLE - 2 VIEW; RIGHT FOOT - 2 VIEW COMPARISON:  None. FINDINGS: There is no acute fracture or dislocation. The bones are osteopenic. The ankle mortise is intact. The soft tissues are unremarkable. IMPRESSION: No acute fracture or dislocation. Electronically Signed   By: Anner Crete M.D.   On: 04/17/2020 21:16   DG Foot 2 Views Right  Result Date: 04/17/2020 CLINICAL DATA:  79 year old female with soft tissue swelling of the lateral malleolus. No known injury. EXAM: PORTABLE RIGHT ANKLE - 2 VIEW; RIGHT FOOT - 2 VIEW COMPARISON:  None. FINDINGS: There is no acute fracture or dislocation. The bones are osteopenic. The ankle mortise is intact. The soft tissues are unremarkable. IMPRESSION: No acute fracture or dislocation. Electronically Signed   By: Anner Crete M.D.   On: 04/17/2020 21:16   EEG adult  Result Date: 04/16/2020 Lora Havens, MD     04/16/2020  1:37 PM Patient Name: ARRIYANNA MERSCH MRN: [id number] Epilepsy Attending: Lora Havens Referring Physician/Provider: Dr Oren Binet Date: 04/16/2020 Duration: 25.12 mins Patient history: 79yo F with ams. EEG to evaluate for seizure. Level of alertness: Awake AEDs during EEG study: xanax, pregabalin Technical aspects: This EEG study was done with scalp electrodes positioned according to the 10-20 International system of electrode placement. Electrical activity was acquired at a sampling rate of 500Hz  and reviewed with a high frequency filter of 70Hz  and a low frequency  filter of 1Hz . EEG data were recorded continuously and digitally stored. Description: The posterior dominant rhythm consists of 8 Hz activity of moderate voltage (25-35 uV) seen predominantly in posterior head regions, symmetric and reactive to eye opening and eye closing.  EEG showed intermittent generalized 3 to 6 Hz theta-delta slowing. Triphasic waves,  generalized were also noted. Hyperventilation and photic stimulation were not performed.   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 No seizures or epileptiform discharges were seen throughout the recording. [place]   DG HIP UNILAT WITH PELVIS 2-3 VIEWS RIGHT  Result Date: 04/18/2020 CLINICAL DATA:  Acute onset right hip pain. No known injury. History of prior right hip replacement. EXAM: DG HIP (WITH OR WITHOUT PELVIS) 2-3V RIGHT COMPARISON:  Plain film of the pelvis 04/13/2020. PET CT scan 01/25/2020. FINDINGS: The first image which is the AP view is over penetrated. Right hip arthroplasty is in place. No hardware complication. No fracture, dislocation or focal bony lesion. Soft tissues are negative. IMPRESSION: No acute abnormality.  Status post right hip replacement. Electronically Signed   By: Inge Rise M.D.   On: 04/18/2020 15:03

## 2020-04-19 NOTE — Progress Notes (Signed)
PHARMACIST - PHYSICIAN COMMUNICATION  DR:   Candiss Norse  CONCERNING: IV to Oral Route Change Policy  RECOMMENDATION: This patient is receiving Protonix by the intravenous route.  Based on criteria approved by the Pharmacy and Therapeutics Committee, the intravenous medication(s) is/are being converted to the equivalent oral dose form(s).   DESCRIPTION: These criteria include:  The patient is eating (either orally or via tube) and/or has been taking other orally administered medications for a least 24 hours  The patient has no evidence of active gastrointestinal bleeding or impaired GI absorption (gastrectomy, short bowel, patient on TNA or NPO).  If you have questions about this conversion, please contact the Pharmacy Department  []   [phone] )  [name] []   [phone] )  [facility] [x]   [phone] )  [name] []   [phone] )  [facility] []   [phone] )  [place], [place] 04/19/2020 3:01 PM

## 2020-04-20 DIAGNOSIS — W19XXXA Unspecified fall, initial encounter: Secondary | ICD-10-CM | POA: Diagnosis not present

## 2020-04-20 DIAGNOSIS — U071 COVID-19: Secondary | ICD-10-CM | POA: Diagnosis not present

## 2020-04-20 DIAGNOSIS — J9611 Chronic respiratory failure with hypoxia: Secondary | ICD-10-CM | POA: Diagnosis not present

## 2020-04-20 DIAGNOSIS — J441 Chronic obstructive pulmonary disease with (acute) exacerbation: Secondary | ICD-10-CM | POA: Diagnosis not present

## 2020-04-20 LAB — C-REACTIVE PROTEIN: CRP: 0.7 mg/dL (ref <1.0)

## 2020-04-20 LAB — COMPREHENSIVE METABOLIC PANEL
ALT: 29 U/L (ref 0–44)
AST: 34 U/L (ref 15–41)
Albumin: 3 g/dL — ABNORMAL LOW (ref 3.5–5.0)
Alkaline Phosphatase: 92 U/L (ref 38–126)
Anion gap: 15 (ref 5–15)
BUN: 25 mg/dL — ABNORMAL HIGH (ref 8–23)
CO2: 19 mmol/L — ABNORMAL LOW (ref 22–32)
Calcium: 9.6 mg/dL (ref 8.9–10.3)
Chloride: 106 mmol/L (ref 98–111)
Creatinine, Ser: 0.74 mg/dL (ref 0.44–1.00)
GFR, Estimated: >60 mL/min (ref >60)
Glucose, Bld: 89 mg/dL (ref 70–99)
Potassium: 4.1 mmol/L (ref 3.5–5.1)
Sodium: 140 mmol/L (ref 135–145)
Total Bilirubin: 0.8 mg/dL (ref 0.3–1.2)
Total Protein: 8.3 g/dL — ABNORMAL HIGH (ref 6.5–8.1)

## 2020-04-20 LAB — CBC WITH DIFFERENTIAL/PLATELET
Abs Immature Granulocytes: 0.05 10*3/uL (ref 0.00–0.07)
Basophils Absolute: 0 10*3/uL (ref 0.0–0.1)
Basophils Relative: 0 %
Eosinophils Absolute: 0 10*3/uL (ref 0.0–0.5)
Eosinophils Relative: 0 %
HCT: 43.4 % (ref 36.0–46.0)
Hemoglobin: 13.5 g/dL (ref 12.0–15.0)
Immature Granulocytes: 1 %
Lymphocytes Relative: 5 %
Lymphs Abs: 0.6 10*3/uL — ABNORMAL LOW (ref 0.7–4.0)
MCH: 26.1 pg (ref 26.0–34.0)
MCHC: 31.1 g/dL (ref 30.0–36.0)
MCV: 83.9 fL (ref 80.0–100.0)
Monocytes Absolute: 0.3 10*3/uL (ref 0.1–1.0)
Monocytes Relative: 3 %
Neutro Abs: 9.8 10*3/uL — ABNORMAL HIGH (ref 1.7–7.7)
Neutrophils Relative %: 91 %
Platelets: 334 10*3/uL (ref 150–400)
RBC: 5.17 MIL/uL — ABNORMAL HIGH (ref 3.87–5.11)
RDW: 18.9 % — ABNORMAL HIGH (ref 11.5–15.5)
WBC: 10.7 10*3/uL — ABNORMAL HIGH (ref 4.0–10.5)
nRBC: 0 % (ref 0.0–0.2)

## 2020-04-20 LAB — MAGNESIUM: Magnesium: 2.1 mg/dL (ref 1.7–2.4)

## 2020-04-20 LAB — CULTURE, BLOOD (ROUTINE X 2)
Culture: NO GROWTH
Special Requests: ADEQUATE

## 2020-04-20 LAB — BRAIN NATRIURETIC PEPTIDE: B Natriuretic Peptide: 106.6 pg/mL — ABNORMAL HIGH (ref 0.0–100.0)

## 2020-04-20 LAB — D-DIMER, QUANTITATIVE: D-Dimer, Quant: 2.4 ug/mL-FEU — ABNORMAL HIGH (ref 0.00–0.50)

## 2020-04-20 MED ORDER — ENOXAPARIN SODIUM 80 MG/0.8ML ~~LOC~~ SOLN
1.0000 mg/kg | Freq: Two times a day (BID) | SUBCUTANEOUS | Status: DC
  Administered 2020-04-20 – 2020-04-21 (×3): 67.5 mg via SUBCUTANEOUS
  Filled 2020-04-20 (×3): qty 0.8

## 2020-04-20 MED ORDER — CHLORHEXIDINE GLUCONATE CLOTH 2 % EX PADS
6.0000 | MEDICATED_PAD | Freq: Every day | CUTANEOUS | Status: DC
  Administered 2020-04-21 – 2020-04-22 (×2): 6 via TOPICAL

## 2020-04-20 MED ORDER — ENOXAPARIN SODIUM 80 MG/0.8ML ~~LOC~~ SOLN
1.0000 mg/kg | Freq: Once | SUBCUTANEOUS | Status: AC
  Administered 2020-04-20: 67.5 mg via SUBCUTANEOUS
  Filled 2020-04-20: qty 0.8

## 2020-04-20 MED ORDER — PANTOPRAZOLE SODIUM 40 MG PO TBEC
40.0000 mg | DELAYED_RELEASE_TABLET | Freq: Two times a day (BID) | ORAL | Status: DC
  Administered 2020-04-22: 40 mg via ORAL
  Filled 2020-04-20 (×2): qty 1

## 2020-04-20 MED ORDER — ALBUTEROL SULFATE HFA 108 (90 BASE) MCG/ACT IN AERS
2.0000 | INHALATION_SPRAY | RESPIRATORY_TRACT | Status: DC | PRN
  Filled 2020-04-20: qty 6.7

## 2020-04-20 MED ORDER — ENSURE ENLIVE PO LIQD
237.0000 mL | Freq: Two times a day (BID) | ORAL | Status: DC
  Administered 2020-04-21 (×2): 237 mL via ORAL

## 2020-04-20 NOTE — Progress Notes (Signed)
PIV consult: Pt has had multiple IV attempts, VAST included. Midline attempted, unsuccessful. Charge RN reported afterward that pt has implanted port (not on flowsheet or avatar). Per Agricultural consultant, there was a discussion of probable infection. Will need MD approval to access port in this case.

## 2020-04-20 NOTE — Plan of Care (Signed)

## 2020-04-20 NOTE — Progress Notes (Signed)
PROGRESS NOTE                                                                                                                                                                                                             Patient Demographics:    [name], is a 79 y.o. female, DOB - 03-Dec-1940, URK:[id number]  Outpatient Primary MD for the patient is [name], FNP   Admit date - 04/13/2020   LOS - 6  Chief Complaint  Patient presents with  . Fall       Brief Narrative: Patient is a 79 y.o. 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  COVID-19 vaccinated status: Unvaccinated  Significant Events: 11/18>> Admit to [facility] for weakness/confusion/COVID-19 pneumonia  Significant studies: 11/18>> chest x-ray: Diffuse interstitial prominence throughout both lungs compatible with chronic lung disease/fibrosis-no definite airspace opacity 11/18>> pelvic x-ray: No acute bony abnormality. 11/18>> CT head/C-spine: No acute abnormality in head/no fractures in C-spine 11/19>> CT chest: Partially necrotic mass in the right lower lobe-fibrosis throughout both lungs.  CAD. 11/21>> EEG - This study is suggestive of mild diffuse encephalopathy, nonspecific etiology but likely related to toxic-metabolic etiology, anoxic/hypoxic brain injury. No seizures or epileptiform discharges were seen throughout the recording. 11/22 >> MRI brain: No acute findings.  COVID-19 medications: Steroids: 11/18>>11/25 Remdesivir: 11/18>>11/22 Monoclonal antibody: 11/16 x 1  Antibiotics: Vancomycin: 11/21>> 11/22 Rocephin: 11/22>> 11/24 Zyvox: 11/23>>  Microbiology data: 11/24>> blood culture: No growth. 11/20>> blood  cultures: Staph hominis (1/2) 11/18 >>blood culture: Staph hominis. 11/18>> urine culture: <10,007 colonies/mL-insignificant growth  Procedures: None  Consults: None  DVT prophylaxis: Therapeutic Lovenox.    Subjective:   Much better than how I saw her a few days ago-apparently had more confusion last night and refused to take some of her medications.   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  Suspect has some significant amount of radiation-induced fibrosis and a CT chest.  Has completed a course of Remdesivir-we will go ahead and stop steroids  today.  Continue supportive care.    Fever: afebrile O2 requirements:  SpO2: 96 % O2 Flow Rate (L/min): 2 L/min     Recent Labs  Lab 04/13/20 1855 04/14/20 0153 04/16/20 0352 04/16/20 0352 04/17/20 0830 04/18/20 0149 04/19/20 0048 04/19/20 0401 04/20/20 0035  WBC 10.5   < > 12.7*  --  9.8 11.7*  --  11.7* 10.7*  HGB 8.6*   < > 11.6*  --  13.5 12.3  --  12.3 13.5  HCT 29.1*   < > 37.1  --  44.1 39.3  --  39.2 43.4  PLT 426*   < > 351  --  444* 447*  --  419* 334  CRP 12.6*   < > 4.2*   < > 3.1* 1.5* 0.7 0.7 0.7  BNP  --   --  103.8*  --  80.4 67.4  --  58.4 106.6*  DDIMER 3.28*   < > 2.87*   < > 2.87* 1.95* 2.20* 2.12* 2.40*  PROCALCITON <0.10  --   --   --  <0.10 <0.10  --  <0.10  --   AST 34   < > 61*  --  45* 31  --  29 34  ALT 13   < > 35  --  37 32  --  28 29  ALKPHOS 90   < > 116  --  119 109  --  101 92  BILITOT 0.5   < > 1.0  --  1.1 1.0  --  1.0 0.8  ALBUMIN 2.6*   < > 2.7*  --  2.9* 2.7*  --  2.6* 3.0*   < > = values in this interval not displayed.    COPD exacerbation: Significantly better-we will finish up steroids on 11/25-continue bronchodilators.  Staph hominis bacteremia: Culture results as above-discussed with Dr. Carolyne Fiscal on-call-chart reviewed over the  phone-recommends total of 7 days of Zyvox from 11/23.  Acute metabolic encephalopathy: Most likely COVID-19 inflammation related encephalopathy, CT, MRI and EEG all nonacute.  No focal deficits.  Although improved-not still at baseline.  All psychotropic medications on hold.  All narcotics on hold as well.  Fall: Secondary debility/deconditioning at baseline-superimposed weakness due to COVID-19 infection.  Appreciate PT/OT eval-daughter refusing SNF and wants to take patient home.  Anxiety/depression: Given confusion-all psychotropic medications on hold.  Reassess tomorrow.   Sinus tachycardia: Improved-likely secondary to acute illness/agitation.  Chronic pain syndrome-opiate dependent: Given encephalopathy-all narcotics on hold-for the past several days-no signs of withdrawal symptoms.  Continue to monitor closely.   Squamous cell carcinoma of the lung-getting radiation treatments-resume outpatient follow-up with radiation oncology/medical oncology-could have some amount of fibrosis/inflammation in the lungs due to radiation.  On steroids.  Per daughter-patient on radiation treatment as was deemed to be a poor candidate for chemotherapy due to overall medical comorbidities.  Normocytic anemia: Hemoglobin admission was probably a lab error-no evidence of any GI loss-repeat hemoglobin this morning back to baseline.  Follow CBC.   History of VTE: On Lovenox.  HTN: BP high, unable to take oral medications consistently, added Nitropaste and as needed hydralazine.  Monitor.  HLD: Continue statin  GERD: Continue PPI  Goals of care: Long discussion with patient's daughter over the phone on several days and again on 04/18/2020, DNR, gentle medical measures. If further decline full comfort care.  GI prophylaxis: PPI   Condition - Stable  Family Communication  : Daughter [name] [phone]) over the phone on 11/25  Code Status :  Full Code  Diet :  Diet Order            Diet 2 gram  sodium Room service appropriate? Yes; Fluid consistency: Thin  Diet effective now                  Disposition Plan  :  Status is: Inpatient  The patient will require care spanning > 2 midnights and should be moved to inpatient because: Inpatient level of care appropriate due to severity of illness  Dispo: The patient is from: Home              Anticipated d/c is to: TBD              Anticipated d/c date is: 2 days              Patient currently is not medically stable to d/c.   Barriers to discharge: Resolving encephalopathy  Antimicorbials  :    Anti-infectives (From admission, onward)   Start     Dose/Rate Route Frequency Ordered Stop   04/18/20 2200  linezolid (ZYVOX) tablet 600 mg        600 mg Oral Every 12 hours 04/18/20 1624 04/30/20 0959   04/17/20 1130  cefTRIAXone (ROCEPHIN) 1 g in sodium chloride 0.9 % 100 mL IVPB        1 g 200 mL/hr over 30 Minutes Intravenous Every 24 hours 04/17/20 1039 04/19/20 1444   04/16/20 2000  vancomycin (VANCOREADY) IVPB 750 mg/150 mL  Status:  Discontinued       "Followed by" Linked Group Details   750 mg 150 mL/hr over 60 Minutes Intravenous Every 24 hours 04/15/20 1647 04/18/20 1624   04/16/20 1700  vancomycin (VANCOREADY) IVPB 750 mg/150 mL  Status:  Discontinued       "Followed by" Linked Group Details   750 mg 150 mL/hr over 60 Minutes Intravenous Every 24 hours 04/15/20 1403 04/15/20 1647   04/15/20 2000  vancomycin (VANCOCIN) IVPB 1000 mg/200 mL premix       "Followed by" Linked Group Details   1,000 mg 200 mL/hr over 60 Minutes Intravenous  Once 04/15/20 1647 04/16/20 0223   04/15/20 1700  vancomycin (VANCOCIN) IVPB 1000 mg/200 mL premix  Status:  Discontinued       "Followed by" Linked Group Details   1,000 mg 200 mL/hr over 60 Minutes Intravenous  Once 04/15/20 1403 04/15/20 1647   04/14/20 1000  remdesivir 100 mg in sodium chloride 0.9 % 100 mL IVPB       "Followed by" Linked Group Details   100 mg 200 mL/hr over 30  Minutes Intravenous Daily 04/13/20 2227 04/17/20 1555   04/13/20 2330  remdesivir 200 mg in sodium chloride 0.9% 250 mL IVPB       "Followed by" Linked Group Details   200 mg 580 mL/hr over 30 Minutes Intravenous Once 04/13/20 2227 04/14/20 0254      Inpatient Medications  Scheduled Meds: . vitamin C  500 mg Oral Daily  . benzonatate  200 mg Oral TID  . dexamethasone (DECADRON) injection  6 mg Intravenous Q24H  . enoxaparin (LOVENOX) injection  1 mg/kg Subcutaneous Q12H  . linezolid  600 mg Oral Q12H  . metoprolol tartrate  50 mg Oral BID  . nitroGLYCERIN  1 inch Topical Q6H  . pantoprazole (PROTONIX) IV  40 mg Intravenous Q12H  . pravastatin  10 mg Oral q1800  . zinc sulfate  220 mg  Oral Daily   Continuous Infusions:  PRN Meds:.acetaminophen, albuterol, haloperidol lactate, hydrALAZINE, Ipratropium-Albuterol, [DISCONTINUED] ondansetron **OR** ondansetron (ZOFRAN) IV, polyethylene glycol   Time Spent in minutes  25  See all Orders from today for further details   Oren Binet M.D on 04/20/2020 at 12:15 PM  To page go to [email or web address] - use universal password  [facility] -  Office  [phone]    Objective:   Vitals:   04/19/20 0500 04/19/20 1204 04/19/20 2103 04/20/20 0500  BP: (!) 162/86 (!) 157/96 (!) 132/92 (!) 126/111  Pulse: 74 60 62 99  Resp: 17 18 20 17   Temp: 98.6 F (37 C) 98 F (36.7 C) 97.6 F (36.4 C) 97.9 F (36.6 C)  TempSrc: Oral Axillary Oral Oral  SpO2: 91% 93% 93% 96%  Weight:      Height:        Wt Readings from Last 3 Encounters:  04/13/20 68 kg     Intake/Output Summary (Last 24 hours) at 04/20/2020 1215 Last data filed at 04/20/2020 0856 Gross per 24 hour  Intake 425 ml  Output 1350 ml  Net -925 ml     Physical Exam Gen Exam: Minimally confused but awake/alert-following commands. HEENT:atraumatic, normocephalic Chest: B/L clear to auscultation anteriorly CVS:S1S2 regular Abdomen:soft non tender, non  distended Extremities:no edema Neurology: Non focal Skin: no rash    Data Review:    CBC Recent Labs  Lab 04/16/20 0352 04/17/20 0830 04/18/20 0149 04/19/20 0401 04/20/20 0035  WBC 12.7* 9.8 11.7* 11.7* 10.7*  HGB 11.6* 13.5 12.3 12.3 13.5  HCT 37.1 44.1 39.3 39.2 43.4  PLT 351 444* 447* 419* 334  MCV 83.2 82.9 82.6 82.2 83.9  MCH 26.0 25.4* 25.8* 25.8* 26.1  MCHC 31.3 30.6 31.3 31.4 31.1  RDW 19.1* 19.3* 18.6* 18.6* 18.9*  LYMPHSABS 0.4* 0.5* 0.8 1.0 0.6*  MONOABS 0.6 0.5 1.0 1.3* 0.3  EOSABS 0.0 0.0 0.0 0.0 0.0  BASOSABS 0.0 0.0 0.0 0.0 0.0    Chemistries  Recent Labs  Lab 04/16/20 0352 04/17/20 0830 04/18/20 0149 04/19/20 0401 04/20/20 0035  NA 140 140 138 140 140  K 3.2* 3.5 3.2* 3.4* 4.1  CL 104 105 102 105 106  CO2 22 21* 20* 21* 19*  GLUCOSE 108* 99 105* 112* 89  BUN 14 21 27* 28* 25*  CREATININE 0.88 1.02* 0.91 0.90 0.74  CALCIUM 9.2 9.9 9.4 9.4 9.6  MG 1.9 2.0 2.0 2.1 2.1  AST 61* 45* 31 29 34  ALT 35 37 32 28 29  ALKPHOS 116 119 109 101 92  BILITOT 1.0 1.1 1.0 1.0 0.8   ------------------------------------------------------------------------------------------------------------------ No results for input(s): CHOL, HDL, LDLCALC, TRIG, CHOLHDL, LDLDIRECT in the last 72 hours.  No results found for: HGBA1C ------------------------------------------------------------------------------------------------------------------ No results for input(s): TSH, T4TOTAL, T3FREE, THYROIDAB in the last 72 hours.  Invalid input(s): FREET3 ------------------------------------------------------------------------------------------------------------------ No results for input(s): VITAMINB12, FOLATE, FERRITIN, TIBC, IRON, RETICCTPCT in the last 72 hours.  Coagulation profile No results for input(s): INR, PROTIME in the last 168 hours.  Recent Labs    04/19/20 0401 04/20/20 0035  DDIMER 2.12* 2.40*    Cardiac Enzymes No results for input(s): CKMB, TROPONINI,  MYOGLOBIN in the last 168 hours.  Invalid input(s): CK ------------------------------------------------------------------------------------------------------------------    Component Value Date/Time   BNP 106.6 (H) 04/20/2020 0035    Micro Results Recent Results (from the past 240 hour(s))  Culture, Urine     Status: Abnormal   Collection Time: 04/13/20  6:59 AM  Specimen: Urine, Random  Result Value Ref Range Status   Specimen Description URINE, RANDOM  Final   Special Requests NONE  Final   Culture (A)  Final    <10,000 COLONIES/mL INSIGNIFICANT GROWTH Performed at [facility], [place] [address], [place], [place] [address]    Report Status 04/15/2020 FINAL  Final  Blood Culture (routine x 2)     Status: Abnormal   Collection Time: 04/13/20  7:00 PM   Specimen: BLOOD  Result Value Ref Range Status   Specimen Description BLOOD SITE NOT SPECIFIED  Final   Special Requests   Final    BOTTLES DRAWN AEROBIC ONLY Blood Culture results may not be optimal due to an inadequate volume of blood received in culture bottles   Culture  Setup Time   Final    GRAM POSITIVE COCCI AEROBIC BOTTLE ONLY Organism ID to follow CRITICAL RESULT CALLED TO, READ BACK BY AND VERIFIED WITH: PHARMD M [place] 04/14/20 AT 1853 SK    Culture (A)  Final    STAPHYLOCOCCUS HOMINIS THE SIGNIFICANCE OF ISOLATING THIS ORGANISM FROM A SINGLE SET OF BLOOD CULTURES WHEN MULTIPLE SETS ARE DRAWN IS UNCERTAIN. PLEASE NOTIFY THE MICROBIOLOGY DEPARTMENT WITHIN ONE WEEK IF SPECIATION AND SENSITIVITIES ARE REQUIRED. Performed at [facility], [place] [address], [place], [place] [address]    Report Status 04/15/2020 FINAL  Final  Blood Culture ID Panel (Reflexed)     Status: Abnormal   Collection Time: 04/13/20  7:00 PM  Result Value Ref Range Status   Enterococcus faecalis NOT DETECTED NOT DETECTED Final   Enterococcus Faecium NOT DETECTED NOT DETECTED Final   Listeria monocytogenes NOT DETECTED NOT DETECTED  Final   Staphylococcus species DETECTED (A) NOT DETECTED Final    Comment: CRITICAL RESULT CALLED TO, READ BACK BY AND VERIFIED WITH: PHARMD M [place] 04/14/20 AT 1853 SK    Staphylococcus aureus (BCID) NOT DETECTED NOT DETECTED Final   Staphylococcus epidermidis NOT DETECTED NOT DETECTED Final   Staphylococcus lugdunensis NOT DETECTED NOT DETECTED Final   Streptococcus species NOT DETECTED NOT DETECTED Final   Streptococcus agalactiae NOT DETECTED NOT DETECTED Final   Streptococcus pneumoniae NOT DETECTED NOT DETECTED Final   Streptococcus pyogenes NOT DETECTED NOT DETECTED Final   A.calcoaceticus-baumannii NOT DETECTED NOT DETECTED Final   Bacteroides fragilis NOT DETECTED NOT DETECTED Final   Enterobacterales NOT DETECTED NOT DETECTED Final   Enterobacter cloacae complex NOT DETECTED NOT DETECTED Final   Escherichia coli NOT DETECTED NOT DETECTED Final   Klebsiella aerogenes NOT DETECTED NOT DETECTED Final   Klebsiella oxytoca NOT DETECTED NOT DETECTED Final   Klebsiella pneumoniae NOT DETECTED NOT DETECTED Final   Proteus species NOT DETECTED NOT DETECTED Final   Salmonella species NOT DETECTED NOT DETECTED Final   Serratia marcescens NOT DETECTED NOT DETECTED Final   Haemophilus influenzae NOT DETECTED NOT DETECTED Final   Neisseria meningitidis NOT DETECTED NOT DETECTED Final   Pseudomonas aeruginosa NOT DETECTED NOT DETECTED Final   Stenotrophomonas maltophilia NOT DETECTED NOT DETECTED Final   Candida albicans NOT DETECTED NOT DETECTED Final   Candida auris NOT DETECTED NOT DETECTED Final   Candida glabrata NOT DETECTED NOT DETECTED Final   Candida krusei NOT DETECTED NOT DETECTED Final   Candida parapsilosis NOT DETECTED NOT DETECTED Final   Candida tropicalis NOT DETECTED NOT DETECTED Final   Cryptococcus neoformans/gattii NOT DETECTED NOT DETECTED Final    Comment: Performed at [facility], [address] [place], [place]  [address]  Culture, blood (routine x 2)      Status: Abnormal   Collection Time: 04/15/20  6:06 PM   Specimen: BLOOD LEFT ARM  Result Value Ref Range Status   Specimen Description BLOOD LEFT ARM  Final   Special Requests   Final    BOTTLES DRAWN AEROBIC ONLY Blood Culture results may not be optimal due to an inadequate volume of blood received in culture bottles   Culture  Setup Time   Final    GRAM POSITIVE COCCI IN CLUSTERS AEROBIC BOTTLE ONLY CRITICAL VALUE NOTED.  VALUE IS CONSISTENT WITH PREVIOUSLY REPORTED AND CALLED VALUE. Performed at [facility], [place] [address], [place], [place] [address]    Culture STAPHYLOCOCCUS HOMINIS (A)  Final   Report Status 04/18/2020 FINAL  Final   Organism ID, Bacteria STAPHYLOCOCCUS HOMINIS  Final      Susceptibility   Staphylococcus hominis - MIC*    CIPROFLOXACIN >=8 RESISTANT Resistant     ERYTHROMYCIN >=8 RESISTANT Resistant     GENTAMICIN <=0.5 SENSITIVE Sensitive     OXACILLIN >=4 RESISTANT Resistant     TETRACYCLINE >=16 RESISTANT Resistant     VANCOMYCIN <=0.5 SENSITIVE Sensitive     TRIMETH/SULFA 80 RESISTANT Resistant     CLINDAMYCIN RESISTANT Resistant     RIFAMPIN <=0.5 SENSITIVE Sensitive     Inducible Clindamycin POSITIVE Resistant     LINEZOLID Value in next row Sensitive      SENSITIVE2    * STAPHYLOCOCCUS HOMINIS  Culture, blood (routine x 2)     Status: None   Collection Time: 04/15/20  6:45 PM   Specimen: BLOOD  Result Value Ref Range Status   Specimen Description BLOOD SITE NOT SPECIFIED  Final   Special Requests   Final    BOTTLES DRAWN AEROBIC AND ANAEROBIC Blood Culture adequate volume   Culture   Final    NO GROWTH 5 DAYS Performed at [facility], [address] [place], [place] [address]    Report Status 04/20/2020 FINAL  Final  Culture, blood (routine x 2)     Status: None (Preliminary result)   Collection Time: 04/19/20  1:03 PM   Specimen: BLOOD  Result Value Ref Range Status   Specimen Description BLOOD LEFT ANTECUBITAL  Final    Special Requests   Final    AEROBIC BOTTLE ONLY Blood Culture results may not be optimal due to an inadequate volume of blood received in culture bottles   Culture   Final    NO GROWTH < 24 HOURS Performed at [facility], [place] [address], [place], [place] [address]    Report Status PENDING  Incomplete  Culture, blood (routine x 2)     Status: None (Preliminary result)   Collection Time: 04/19/20  6:26 PM   Specimen: BLOOD  Result Value Ref Range Status   Specimen Description BLOOD LEFT ANTECUBITAL  Final   Special Requests   Final    BOTTLES DRAWN AEROBIC AND ANAEROBIC Blood Culture results may not be optimal due to an excessive volume of blood received in culture bottles   Culture   Final    NO GROWTH < 12 HOURS Performed at [facility], [place] [address] [place], [place] [address]    Report Status PENDING  Incomplete    Radiology Reports CT Head Wo Contrast  Result Date: 04/13/2020 CLINICAL DATA:  79 year old female with head trauma. EXAM: CT HEAD WITHOUT CONTRAST CT CERVICAL SPINE WITHOUT  CONTRAST TECHNIQUE: Multidetector CT imaging of the head and cervical spine was performed following the standard protocol without intravenous contrast. Multiplanar CT image reconstructions of the cervical spine were also generated. COMPARISON:  CT dated 08/28/2019. FINDINGS: CT HEAD FINDINGS Brain: Mild age-related atrophy and chronic microvascular ischemic changes. There is no acute intracranial hemorrhage. No mass effect or midline shift. No extra-axial fluid collection. Vascular: No hyperdense vessel or unexpected calcification. Skull: Normal. Negative for fracture or focal lesion. Sinuses/Orbits: No acute finding. Other: None CT CERVICAL SPINE FINDINGS Alignment: No acute subluxation. Skull base and vertebrae: No acute fracture. Osteopenia. Soft tissues and spinal canal: No prevertebral fluid or swelling. No visible canal hematoma. Disc levels:  Degenerative changes. No acute findings.  Upper chest: Small left upper lobe calcified granuloma. Partially visualized right subclavian central venous line. Other: Bilateral carotid bulb calcified plaques. IMPRESSION: 1. No acute intracranial pathology. Mild age-related atrophy and chronic microvascular ischemic changes. 2. No acute/traumatic cervical spine pathology. Electronically Signed   By: Anner Crete M.D.   On: 04/13/2020 20:44   CT CHEST WO CONTRAST  Result Date: 04/14/2020 CLINICAL DATA:  Respiratory illness EXAM: CT CHEST WITHOUT CONTRAST TECHNIQUE: Multidetector CT imaging of the chest was performed following the standard protocol without IV contrast. COMPARISON:  Chest x-ray 04/13/2020.  CT 04/11/2020 FINDINGS: Cardiovascular: Aortic atherosclerosis. Scattered coronary artery calcifications. Heart is normal size and aorta is normal caliber. Mediastinum/Nodes: Small scattered non pathologically enlarged mediastinal lymph nodes, stable. No mediastinal, hilar, or axillary adenopathy. Trachea and esophagus are unremarkable. Thyroid unremarkable. Lungs/Pleura: Peripheral interstitial prominence in thickening compatible with fibrosis, stable. Rounded partially necrotic mass in the posterior right lower lobe measures 3.1 cm and is slightly decreased in size since prior study when this measured 3.8 cm. No effusions. No confluent opacities on the left. Calcified granuloma in the left upper lobe. Upper Abdomen: Imaging into the upper abdomen demonstrates no acute findings. Musculoskeletal: Chest wall soft tissues are unremarkable. Right upper chest wall Port-A-Cath remains in place, unchanged. No acute bony abnormality. Old posterior right 7th rib fracture and upper thoracic compression fractures, unchanged. IMPRESSION: Partially necrotic mass in the posterior right lower lobe is again noted, slightly decreased in size since prior study, 3.1 cm currently compared to 3.8 cm previously. Changes of fibrosis throughout the lungs. Coronary artery  disease. Aortic Atherosclerosis (ICD10-I70.0). Electronically Signed   By: Rolm Baptise M.D.   On: 04/14/2020 18:33   CT Cervical Spine Wo Contrast  Result Date: 04/13/2020 CLINICAL DATA:  79 year old female with head trauma. EXAM: CT HEAD WITHOUT CONTRAST CT CERVICAL SPINE WITHOUT CONTRAST TECHNIQUE: Multidetector CT imaging of the head and cervical spine was performed following the standard protocol without intravenous contrast. Multiplanar CT image reconstructions of the cervical spine were also generated. COMPARISON:  CT dated 08/28/2019. FINDINGS: CT HEAD FINDINGS Brain: Mild age-related atrophy and chronic microvascular ischemic changes. There is no acute intracranial hemorrhage. No mass effect or midline shift. No extra-axial fluid collection. Vascular: No hyperdense vessel or unexpected calcification. Skull: Normal. Negative for fracture or focal lesion. Sinuses/Orbits: No acute finding. Other: None CT CERVICAL SPINE FINDINGS Alignment: No acute subluxation. Skull base and vertebrae: No acute fracture. Osteopenia. Soft tissues and spinal canal: No prevertebral fluid or swelling. No visible canal hematoma. Disc levels:  Degenerative changes. No acute findings. Upper chest: Small left upper lobe calcified granuloma. Partially visualized right subclavian central venous line. Other: Bilateral carotid bulb calcified plaques. IMPRESSION: 1. No acute intracranial pathology. Mild age-related atrophy and chronic microvascular ischemic  changes. 2. No acute/traumatic cervical spine pathology. Electronically Signed   By: Anner Crete M.D.   On: 04/13/2020 20:44   MR BRAIN WO CONTRAST  Result Date: 04/17/2020 CLINICAL DATA:  Coronavirus infection. Altered mental status. History of squamous cell carcinoma of the lung. EXAM: MRI HEAD WITHOUT CONTRAST TECHNIQUE: Multiplanar, multiecho pulse sequences of the brain and surrounding structures were obtained without intravenous contrast. COMPARISON:  Head CT  04/13/2020.  MRI 03/04/2020. FINDINGS: Brain: Diffusion imaging does not show any acute or subacute infarction or other cause of restricted diffusion. No abnormality affects the brainstem or cerebellum. Cerebral hemispheres show age related atrophy with mild chronic small-vessel ischemic changes of the white matter, often seen at this age. No cortical or large vessel territory infarction. No mass lesion, hemorrhage, hydrocephalus or extra-axial collection. Vascular: Major vessels at the base of the brain show flow. Skull and upper cervical spine: Negative Sinuses/Orbits: Sinuses are clear except for a small amount of fluid layering in the left division of the sphenoid sinus, not likely significant. Orbits negative. Previous lens implant on right Other: None IMPRESSION: No acute finding. Age related atrophy. 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 Electronically Signed   By: Nelson Chimes M.D.   On: 04/17/2020 10:50   DG Pelvis Portable  Result Date: 04/13/2020 CLINICAL DATA:  Fall EXAM: PORTABLE PELVIS 1-2 VIEWS COMPARISON:  None. FINDINGS: Prior right hip replacement. Normal AP alignment. Mild degenerative changes within the left hip. SI joints symmetric and unremarkable. No acute bony abnormality. Specifically, no fracture, subluxation, or dislocation. IMPRESSION: No acute bony abnormality. Electronically Signed   By: Rolm Baptise M.D.   On: 04/13/2020 19:21   DG Chest Port 1 View  Result Date: 04/13/2020 CLINICAL DATA:  Fall, COVID EXAM: PORTABLE CHEST 1 VIEW COMPARISON:  04/11/2020 FINDINGS: Right Port-A-Cath remains in place, unchanged. Heart is normal size. Low lung volumes. Diffuse interstitial prominence throughout the lungs compatible with chronic lung disease/fibrosis. No definite acute confluent airspace opacity. Previously seen rounded mass posteriorly in the right lung better seen on prior CT. No effusions. IMPRESSION: Stable appearance of the  chest. Electronically Signed   By: Rolm Baptise M.D.   On: 04/13/2020 19:21   DG Ankle Right Port  Result Date: 04/17/2020 CLINICAL DATA:  79 year old female with soft tissue swelling of the lateral malleolus. No known injury. EXAM: PORTABLE RIGHT ANKLE - 2 VIEW; RIGHT FOOT - 2 VIEW COMPARISON:  None. FINDINGS: There is no acute fracture or dislocation. The bones are osteopenic. The ankle mortise is intact. The soft tissues are unremarkable. IMPRESSION: No acute fracture or dislocation. Electronically Signed   By: Anner Crete M.D.   On: 04/17/2020 21:16   DG Foot 2 Views Right  Result Date: 04/17/2020 CLINICAL DATA:  79 year old female with soft tissue swelling of the lateral malleolus. No known injury. EXAM: PORTABLE RIGHT ANKLE - 2 VIEW; RIGHT FOOT - 2 VIEW COMPARISON:  None. FINDINGS: There is no acute fracture or dislocation. The bones are osteopenic. The ankle mortise is intact. The soft tissues are unremarkable. IMPRESSION: No acute fracture or dislocation. Electronically Signed   By: Anner Crete M.D.   On: 04/17/2020 21:16   EEG adult  Result Date: 04/16/2020 Lora Havens, MD     04/16/2020  1:37 PM Patient Name: SAKINAH ROSAMOND MRN: [id number] Epilepsy Attending: Lora Havens Referring Physician/Provider: Dr Oren Binet Date: 04/16/2020 Duration: 25.12 mins Patient history: 79yo F with ams. EEG  to evaluate for seizure. Level of alertness: Awake AEDs during EEG study: xanax, pregabalin Technical aspects: This EEG study was done with scalp electrodes positioned according to the 10-20 International system of electrode placement. Electrical activity was acquired at a sampling rate of 500Hz  and reviewed with a high frequency filter of 70Hz  and a low frequency filter of 1Hz . EEG data were recorded continuously and digitally stored. Description: The posterior dominant rhythm consists of 8 Hz activity of moderate voltage (25-35 uV) seen predominantly in posterior head regions,  symmetric and reactive to eye opening and eye closing.  EEG showed intermittent generalized 3 to 6 Hz theta-delta slowing. Triphasic waves, generalized were also noted. Hyperventilation and photic stimulation were not performed.   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 No seizures or epileptiform discharges were seen throughout the recording. [place]   DG HIP UNILAT WITH PELVIS 2-3 VIEWS RIGHT  Result Date: 04/18/2020 CLINICAL DATA:  Acute onset right hip pain. No known injury. History of prior right hip replacement. EXAM: DG HIP (WITH OR WITHOUT PELVIS) 2-3V RIGHT COMPARISON:  Plain film of the pelvis 04/13/2020. PET CT scan 01/25/2020. FINDINGS: The first image which is the AP view is over penetrated. Right hip arthroplasty is in place. No hardware complication. No fracture, dislocation or focal bony lesion. Soft tissues are negative. IMPRESSION: No acute abnormality.  Status post right hip replacement. Electronically Signed   By: Inge Rise M.D.   On: 04/18/2020 15:03

## 2020-04-20 NOTE — Progress Notes (Signed)
Patient remains in bed near end of shift. Alert to self but confused. Patient in wrist and lap restraint due to pulling at wires and attempts to get out of bed. Patient will continue to sit up despite lap restraint. Patient continues on room air with saturations at 96%. Patient unwilling to take oral meds or eat meals. Attempted to place IV during shift to start on hepain, unsuccessful. IV team called and even was unsuccessful. Heparin changed to lovenox injection. Patient continues with pure wick. No BM noted during shift. Safety measures remain in place, will continue to monitor and SBARR to day shift nurse.

## 2020-04-21 ENCOUNTER — Other Ambulatory Visit: Payer: Self-pay | Admitting: Oncology

## 2020-04-21 DIAGNOSIS — J9611 Chronic respiratory failure with hypoxia: Secondary | ICD-10-CM | POA: Diagnosis not present

## 2020-04-21 DIAGNOSIS — Z515 Encounter for palliative care: Secondary | ICD-10-CM

## 2020-04-21 DIAGNOSIS — U071 COVID-19: Secondary | ICD-10-CM | POA: Diagnosis not present

## 2020-04-21 DIAGNOSIS — R627 Adult failure to thrive: Secondary | ICD-10-CM | POA: Diagnosis not present

## 2020-04-21 DIAGNOSIS — J441 Chronic obstructive pulmonary disease with (acute) exacerbation: Secondary | ICD-10-CM | POA: Diagnosis not present

## 2020-04-21 DIAGNOSIS — W19XXXA Unspecified fall, initial encounter: Secondary | ICD-10-CM | POA: Diagnosis not present

## 2020-04-21 LAB — CBC WITH DIFFERENTIAL/PLATELET
Abs Immature Granulocytes: 0.1 10*3/uL — ABNORMAL HIGH (ref 0.00–0.07)
Basophils Absolute: 0 10*3/uL (ref 0.0–0.1)
Basophils Relative: 0 %
Eosinophils Absolute: 0 10*3/uL (ref 0.0–0.5)
Eosinophils Relative: 0 %
HCT: 42.9 % (ref 36.0–46.0)
Hemoglobin: 13.2 g/dL (ref 12.0–15.0)
Immature Granulocytes: 1 %
Lymphocytes Relative: 8 %
Lymphs Abs: 0.9 10*3/uL (ref 0.7–4.0)
MCH: 25.5 pg — ABNORMAL LOW (ref 26.0–34.0)
MCHC: 30.8 g/dL (ref 30.0–36.0)
MCV: 83 fL (ref 80.0–100.0)
Monocytes Absolute: 1.3 10*3/uL — ABNORMAL HIGH (ref 0.1–1.0)
Monocytes Relative: 12 %
Neutro Abs: 8.6 10*3/uL — ABNORMAL HIGH (ref 1.7–7.7)
Neutrophils Relative %: 79 %
Platelets: 472 10*3/uL — ABNORMAL HIGH (ref 150–400)
RBC: 5.17 MIL/uL — ABNORMAL HIGH (ref 3.87–5.11)
RDW: 18.7 % — ABNORMAL HIGH (ref 11.5–15.5)
WBC: 10.9 10*3/uL — ABNORMAL HIGH (ref 4.0–10.5)
nRBC: 0 % (ref 0.0–0.2)

## 2020-04-21 LAB — COMPREHENSIVE METABOLIC PANEL
ALT: 26 U/L (ref 0–44)
AST: 31 U/L (ref 15–41)
Albumin: 3.1 g/dL — ABNORMAL LOW (ref 3.5–5.0)
Alkaline Phosphatase: 100 U/L (ref 38–126)
Anion gap: 16 — ABNORMAL HIGH (ref 5–15)
BUN: 33 mg/dL — ABNORMAL HIGH (ref 8–23)
CO2: 23 mmol/L (ref 22–32)
Calcium: 10.4 mg/dL — ABNORMAL HIGH (ref 8.9–10.3)
Chloride: 105 mmol/L (ref 98–111)
Creatinine, Ser: 1 mg/dL (ref 0.44–1.00)
GFR, Estimated: 58 mL/min — ABNORMAL LOW (ref >60)
Glucose, Bld: 128 mg/dL — ABNORMAL HIGH (ref 70–99)
Potassium: 4 mmol/L (ref 3.5–5.1)
Sodium: 144 mmol/L (ref 135–145)
Total Bilirubin: 0.9 mg/dL (ref 0.3–1.2)
Total Protein: 8.6 g/dL — ABNORMAL HIGH (ref 6.5–8.1)

## 2020-04-21 MED ORDER — ENOXAPARIN SODIUM 80 MG/0.8ML ~~LOC~~ SOLN
70.0000 mg | Freq: Two times a day (BID) | SUBCUTANEOUS | Status: DC
  Administered 2020-04-22 – 2020-04-23 (×3): 70 mg via SUBCUTANEOUS
  Filled 2020-04-21 (×3): qty 0.8

## 2020-04-21 NOTE — Progress Notes (Signed)
Occupational Therapy Treatment Patient Details Name: [name] MRN: [id number] DOB: [birth year] Today's Date: 04/21/2020    History of present illness Pt is a 79 y.o. female dx with COVID-19 on 04/11/20, now admitted 04/13/20 with fall, generalized weakness and confusion. Workup for acute on chronic hypoxic respiratory failure due to COVID PNA, acute toxic encephalopathy. Chest CT with partially necrotic mass in RLL, fibrosis throughout lungs. Head MRI negative for acute abnormality. EEG suggestive of mild diffuse encephalopathy, nonspecific etiology but likely related to toxic-metabolic etiology, anoxic/hypoxic brain injury. 11/22 noted severe pain RLE; MD ordered xray Rt foot, ankle negative (noted pelvic xray 11/18 negative; repeated 11/23 negative).  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 (per daughter has dislocated rt hip x2 since March '21)   OT comments  Pt seen for OT follow up session with focus on OOB mobility progression. Pt less interactive this date, even with her favorite music- kept eyes closed most of session. Pt sat EOB ~7 mins with max A. Once EOB HR up to 127 bpm so further mobility deferred. Attempted self feeding with max A, pt remains resistant and spitting out food. Per RN palliative consult has been made. OT will continue to folllow along as appropriate per POC listed below.    Follow Up Recommendations  SNF;Supervision/Assistance - 24 hour    Equipment Recommendations  3 in 1 bedside commode;Wheelchair (measurements OT);Wheelchair cushion (measurements OT)    Recommendations for Other Services      Precautions / Restrictions Precautions Precautions: Fall Restrictions Weight Bearing Restrictions: No Other Position/Activity Restrictions: Pt holding RLE in some internal rotation and knee extension (even when sitting EOB); hx of dislocating R hip       Mobility Bed Mobility Overal bed mobility: Needs  Assistance Bed Mobility: Supine to Sit;Sit to Supine     Supine to sit: Max assist Sit to supine: Max assist   General bed mobility comments: assist to initiate transfer to come to EOB with support at both BLEs and trunk.  Transfers                 General transfer comment: NT this date, HR up to 127 with sitting EOB    Balance Overall balance assessment: Needs assistance Sitting-balance support: Feet supported;No upper extremity supported Sitting balance-Leahy Scale: Fair Sitting balance - Comments: at times would extreme forward flex trunk to rest head on hands                                   ADL either performed or assessed with clinical judgement   ADL Overall ADL's : Needs assistance/impaired Eating/Feeding: Maximal assistance;Sitting;Bed level Eating/Feeding Details (indicate cue type and reason): limited engagement in meal time. Would not initiate with UEs, but would open mouth when food introduced. She then spit out food into her hand                                   General ADL Comments: session focused on sitting EOB for self feeding. Pt HR up to 127 sitting EOB so further activity deferred     Vision Patient Visual Report: No change from baseline     Perception     Praxis      Cognition Arousal/Alertness: Awake/alert Behavior During Therapy: WFL for tasks assessed/performed;Anxious;Restless Overall Cognitive Status:  No family/caregiver present to determine baseline cognitive functioning                                 General Comments: pt minimally verbal and less interactive with music playing. Kept eyes closed most of session        Exercises     Shoulder Instructions       General Comments      Pertinent Vitals/ Pain       Pain Assessment: Faces Pain Score: 0-No pain  Home Living                                          Prior Functioning/Environment               Frequency  Min 2X/week        Progress Toward Goals  OT Goals(current goals can now be found in the care plan section)  Progress towards OT goals: Progressing toward goals (increased HR limiting further mobility; palliative now involved)  Acute Rehab OT Goals OT Goal Formulation: Patient unable to participate in goal setting Time For Goal Achievement: 04/29/20 Potential to Achieve Goals: [place] Discharge plan remains appropriate    Co-evaluation                 AM-PAC OT "6 Clicks" Daily Activity     Outcome Measure   Help from another person eating meals?: A Lot Help from another person taking care of personal grooming?: A Lot Help from another person toileting, which includes using toliet, bedpan, or urinal?: Total Help from another person bathing (including washing, rinsing, drying)?: A Lot Help from another person to put on and taking off regular upper body clothing?: A Lot Help from another person to put on and taking off regular lower body clothing?: Total 6 Click Score: 10    End of Session    OT Visit Diagnosis: Unsteadiness on feet (R26.81);Other abnormalities of gait and mobility (R26.89);Muscle weakness (generalized) (M62.81);Other symptoms and signs involving cognitive function   Activity Tolerance Patient limited by fatigue;Treatment limited secondary to medical complications (Comment) (elevated HR)   Patient Left in bed;with call bell/phone within reach;with bed alarm set;with restraints reapplied   Nurse Communication Mobility status        Time: 8016-5537 OT Time Calculation (min): 27 min  Charges: OT General Charges $OT Visit: 1 Visit OT Treatments $Self Care/Home Management : 23-37 mins  Zenovia Jarred, MSOT, OTR/L [facility] Office Number: [phone] Pager: [phone]  Zenovia Jarred 04/21/2020, 10:24 AM

## 2020-04-21 NOTE — Consult Note (Signed)
Consultation Note Date: 04/21/2020   Patient Name: [name]  DOB: November 23, 1940  MRN: [id number]  Age / Sex: 79 y.o., female  PCP: [name], FNP Referring Physician: Jonetta Osgood, MD  Reason for Consultation: Establishing goals of care and Psychosocial/spiritual support  HPI/Patient Profile: 79 y.o. 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  Patient was receiving her oncology care at [facility].  Dr. Tasia Catchings is her medical oncologist and Dr. Donella Stade is the radiation oncologist.  I am unable to view any medical records via EMR  Of note, patient was recently diagnosed with COVID-19 on 11/16.  At that time, patient received a monoclonal antibody infusion in our emergency department.  Since patient's diagnosis with COVID-19 on 11/16, family reports that she has developed progressively worsening lethargy and confusion.  According to family, patient has no history of dementia.  Since her diagnosis, patient is becoming more more disoriented with poor oral intake generalized weakness and lethargy.  Family also reports associated cough but denies any fevers, nausea, vomiting or diarrhea.  Patient symptoms have progressively worsened over the past several days.  Earlier in the day on 11/18, the patient was standing on her porch when she suddenly collapsed and fell to the ground.  The daughter witnessed this fall and states that she struck the side of her head/ear on a nearby rocking chair.  Patient at that point was brought into [facility] emergency department for  evaluation via EMS.  Head CT negative.  Patient continued to be quite lethargic in the emergency department thought to be secondary to encephalopathy due to progressive COVID-19 infection.    Admitted for treatment and stabilization.  Today is day 7 of this hospitalization.  The patient continues to fail to thrive.  She is dependent for all ADLs, has poor p.o. intake and is encephalopathic.   Family face treatment option decisions, advanced directive decisions, and anticipatory care needs.     Clinical Assessment and Goals of Care:   This NP Wadie Lessen reviewed medical records, received report from team, and then spoke to daughter/ [name] to discuss diagnosis, prognosis, GOC, EOL wishes disposition and options.   Concept of Palliative Care was introduced as specialized medical care for people and their families living with serious illness.  If focuses on providing relief from the symptoms and stress of a serious illness.  The goal is to improve quality of life for both the patient and the family.  Values and goals of care important to patient and family were attempted to be elicited.  Created space and opportunity for daughter  to explore thoughts and feelings regarding current medical information.  She verbalizes understanding of the seriousness of her mother's current medical situation.  She reports that her mother has had continued slow physical and functional decline over the past many months.  She reports that her mother actually verbalized to her prior to this admission she "  was not coming home".   A  discussion was had today regarding advanced directives.  Concepts specific to code status, artifical feeding and hydration, continued IV antibiotics and rehospitalization was had.  The difference between a aggressive medical intervention path  and a palliative comfort care path for this patient at this time was had.  I shared with daughter my concern that the patient is not  progressing regardless of medical interventions and her high risk for continued decompensation. Education offered regarding concept specific to human mortality and adult failure to thrive, and the limitations of medical interventions to prolong quality of life when the body does fail to thrive.  Education offered on hospice benefit both in the home and at a residential facility.  MOST form discussed.   Natural trajectory and expectations at EOL were discussed.  Questions and concerns addressed.  Patient  encouraged to call with questions or concerns.     PMT will continue to support holistically.   No documented HPOA or AD.  Patient does have 3 sons but they defer all decision making to their Sister [name].    SUMMARY OF RECOMMENDATIONS    Code Status/Advance Care Planning:  DNR   Palliative Prophylaxis:   Aspiration, Bowel Regimen, Delirium Protocol, Frequent Pain Assessment and Oral Care  Additional Recommendations (Limitations, Scope, Preferences):  Full Scope Treatment   Family is unable to make any definitive changes to treatment plan.  Will need continued conversation and support as they navigate healthcare decisions and anticipatory care needs.  Psycho-social/Spiritual:   Desire for further Chaplaincy support:no  Additional Recommendations: Education on Hospice  Prognosis:   Prognosis is poor and will depend on decisions regarding life prolonging measures  Discharge Planning: To Be Determined      Primary Diagnoses: Present on Admission: . COVID-19 virus infection . Acute metabolic encephalopathy . COPD with acute exacerbation ([place]) . History of pulmonary embolism . Chronic respiratory failure with hypoxia ([place]) . Squamous cell carcinoma of lung, stage III, right ([place]) . GERD (gastroesophageal reflux disease) . Anemia   I have reviewed the medical record, interviewed the patient and family, and examined the patient. The following aspects are  pertinent.  History reviewed. No pertinent past medical history. Social History   Socioeconomic History  . Marital status: Widowed    Spouse name: Not on file  . Number of children: Not on file  . Years of education: Not on file  . Highest education level: Not on file  Occupational History  . Not on file  Tobacco Use  . Smoking status: Former Smoker    Quit date: 1990    Years since quitting: 31.9  . Smokeless tobacco: Never Used  Substance and Sexual Activity  . Alcohol use: Not on file  . Drug use: Not on file  . Sexual activity: Not on file  Other Topics Concern  . Not on file  Social History Narrative  . Not on file   Social Determinants of Health   Financial Resource Strain:   . Difficulty of Paying Living Expenses: Not on file  Food Insecurity:   . Worried About Charity fundraiser in the Last Year: Not on file  . Ran Out of Food in the Last Year: Not on file  Transportation Needs:   .  Lack of Transportation (Medical): Not on file  . Lack of Transportation (Non-Medical): Not on file  Physical Activity:   . Days of Exercise per Week: Not on file  . Minutes of Exercise per Session: Not on file  Stress:   . Feeling of Stress : Not on file  Social Connections:   . Frequency of Communication with Friends and Family: Not on file  . Frequency of Social Gatherings with Friends and Family: Not on file  . Attends Religious Services: Not on file  . Active Member of Clubs or Organizations: Not on file  . Attends Archivist Meetings: Not on file  . Marital Status: Not on file   Family History  Problem Relation Age of Onset  . Heart disease Mother   . Heart disease Father    Scheduled Meds: . vitamin C  500 mg Oral Daily  . benzonatate  200 mg Oral TID  . Chlorhexidine Gluconate Cloth  6 each Topical Daily  . enoxaparin (LOVENOX) injection  1 mg/kg Subcutaneous Q12H  . feeding supplement  237 mL Oral BID BM  . linezolid  600 mg Oral Q12H  . metoprolol  tartrate  50 mg Oral BID  . nitroGLYCERIN  1 inch Topical Q6H  . pantoprazole  40 mg Oral BID  . pravastatin  10 mg Oral q1800  . zinc sulfate  220 mg Oral Daily   Continuous Infusions: PRN Meds:.acetaminophen, albuterol, haloperidol lactate, hydrALAZINE, Ipratropium-Albuterol, [DISCONTINUED] ondansetron **OR** ondansetron (ZOFRAN) IV, polyethylene glycol Medications Prior to Admission:  Prior to Admission medications   Medication Sig Start Date End Date Taking? Authorizing Provider  acetaminophen (TYLENOL) 500 MG tablet Take 1,000 mg by mouth in the morning, at noon, and at bedtime.   Yes [provider]  albuterol (ACCUNEB) 1.25 MG/3ML nebulizer solution Take 1 ampule by nebulization every 6 (six) hours as needed for wheezing or shortness of breath.   Yes [provider]  albuterol (VENTOLIN HFA) 108 (90 Base) MCG/ACT inhaler Inhale 2 puffs into the lungs every 6 (six) hours as needed for wheezing or shortness of breath.   Yes [provider]  alendronate (FOSAMAX) 70 MG tablet Take 70 mg by mouth every Sunday. Take with a full glass of water on an empty stomach.   Yes [provider]  ALPRAZolam Duanne Moron) 1 MG tablet Take 1 mg by mouth at bedtime.   Yes [provider]  amLODipine (NORVASC) 5 MG tablet Take 5 mg by mouth daily.   Yes [provider]  apixaban (ELIQUIS) 2.5 MG TABS tablet Take 2.5 mg by mouth 2 (two) times daily.   Yes [provider]  bisacodyl (DULCOLAX) 5 MG EC tablet Take 15 mg by mouth daily as needed for moderate constipation.   Yes [provider]  diphenhydrAMINE (BENADRYL) 25 mg capsule Take 25 mg by mouth at bedtime as needed for sleep.   Yes [provider]  docusate sodium (COLACE) 100 MG capsule Take 100 mg by mouth every morning.   Yes [provider]  DULoxetine (CYMBALTA) 60 MG capsule Take 60 mg by mouth every morning.   Yes [provider]  esomeprazole (NEXIUM)  40 MG capsule Take 40 mg by mouth every morning.   Yes [provider]  fentaNYL (DURAGESIC) 50 MCG/HR Place 1 patch onto the skin every 3 (three) days.   Yes [provider]  ferrous sulfate 325 (65 FE) MG tablet Take 325 mg by mouth 2 (two) times a  week. Tues and Thurs   Yes [provider]  linaclotide (LINZESS) 290 MCG CAPS capsule Take 290 mcg by mouth daily before breakfast.   Yes [provider]  lovastatin (MEVACOR) 10 MG tablet Take 10 mg by mouth every morning.   Yes [provider]  ondansetron (ZOFRAN) 4 MG tablet Take 4 mg by mouth every 8 (eight) hours as needed for nausea or vomiting.   Yes [provider]  oxyCODONE (OXYCONTIN) 10 mg 12 hr tablet Take 10 mg by mouth every 12 (twelve) hours as needed (breakthrough pain).   Yes [provider]  polyethylene glycol (MIRALAX / GLYCOLAX) 17 g packet Take 17 g by mouth every morning.   Yes [provider]  potassium chloride (KLOR-CON) 10 MEQ tablet Take 10 mEq by mouth 2 (two) times daily.   Yes [provider]  pregabalin (LYRICA) 100 MG capsule Take 100 mg by mouth 2 (two) times daily.   Yes [provider]  prochlorperazine (COMPAZINE) 10 MG tablet Take 10 mg by mouth every 6 (six) hours as needed for nausea or vomiting.   Yes [provider]  promethazine (PHENERGAN) 12.5 MG tablet Take 12.5 mg by mouth every 6 (six) hours as needed for nausea or vomiting.   Yes [provider]  traZODone (DESYREL) 50 MG tablet Take 50 mg by mouth at bedtime.   Yes [provider]  vitamin B-12 (CYANOCOBALAMIN) 500 MCG tablet Take 1,000 mcg by mouth daily.   Yes [provider]   Allergies  Allergen Reactions  . Ampicillin Itching and Nausea And Vomiting  . Other     Muscle relaxer  . Sulfa Antibiotics Itching and Nausea And Vomiting  . Toradol [Ketorolac Tromethamine] Itching and Nausea And Vomiting  . Tramadol Itching  and Nausea And Vomiting   Review of Systems  Unable to perform ROS: Acuity of condition    Physical Exam   The above conversation was completed via telephone due to the visitor restrictions during the COVID-19 pandemic. Thorough chart review and discussion with necessary members of the care team was completed as part of assessment. All issues were discussed and addressed but no physical exam was performed.   Vital Signs: BP 120/65 (BP Location: Right Arm)   Pulse 68   Temp 97.9 F (36.6 C) (Oral)   Resp 18   Ht 5\' 4"  (1.626 m)   Wt 68 kg   SpO2 97%   BMI 25.75 kg/m  Pain Scale: 0-10 POSS *See Group Information*: 1-Acceptable,Awake and alert Pain Score: 0-No pain   SpO2: SpO2: 97 % O2 Device:SpO2: 97 % O2 Flow Rate: .O2 Flow Rate (L/min): 2 L/min  IO: Intake/output summary:   Intake/Output Summary (Last 24 hours) at 04/21/2020 1108 Last data filed at 04/21/2020 9323 Gross per 24 hour  Intake 357 ml  Output 300 ml  Net 57 ml    LBM: Last BM Date: 04/17/20 Baseline Weight: Weight: 68 kg Most recent weight: Weight: 68 kg     Palliative Assessment/Data: 20%   Discussed with Dr Sloan Leiter  This nurse practitioner informed  the family and the attending that I will be out of the hospital until Monday morning.  If the patient is still hospitalized I will follow-up at that time.  Call palliative medicine team phone # [phone] with questions or concerns.  Time In: 0900 Time Out: 1015 Time Total: 75 minutes Greater than 50%  of this time was spent counseling and coordinating care related to  the above assessment and plan.  Signed by: Wadie Lessen, NP   Please contact Palliative Medicine Team phone at [phone] for questions and concerns.  For individual provider: See Shea Evans

## 2020-04-21 NOTE — Plan of Care (Signed)
  Problem: Clinical Measurements: Goal: Cardiovascular complication will be avoided Outcome: Progressing   Problem: Health Behavior/Discharge Planning: Goal: Ability to manage health-related needs will improve Outcome: Not Progressing   Problem: Clinical Measurements: Goal: Ability to maintain clinical measurements within normal limits will improve Outcome: Not Progressing  Patient refusing treatments and not taking her medications, patient was educated about risk of missing doses of medications

## 2020-04-21 NOTE — Progress Notes (Signed)
PROGRESS NOTE                                                                                                                                                                                                             Patient Demographics:    [name], is a 79 y.o. female, DOB - 06-25-1940, JME:[id number]  Outpatient Primary MD for the patient is [name], FNP   Admit date - 04/13/2020   LOS - 7  Chief Complaint  Patient presents with  . Fall       Brief Narrative: Patient is a 79 y.o. 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 Hospital course complicated by lingering/waxing/waning encephalopathy-and severe failure to thrive syndrome with very poor oral intake.  COVID-19 vaccinated status: Unvaccinated  Significant Events: 11/18>> Admit to [facility] for weakness/confusion/COVID-19 pneumonia  Significant studies: 11/18>> chest x-ray: Diffuse interstitial prominence throughout both lungs compatible with chronic lung disease/fibrosis-no definite airspace opacity 11/18>> pelvic x-ray: No acute bony abnormality. 11/18>> CT head/C-spine: No acute abnormality in head/no fractures in C-spine 11/19>> CT chest: Partially necrotic mass in the right lower lobe-fibrosis throughout both lungs.  CAD. 11/21>> EEG - This study is suggestive of mild diffuse encephalopathy, nonspecific etiology but likely related to toxic-metabolic etiology, anoxic/hypoxic brain injury. No seizures or epileptiform discharges were seen throughout the recording. 11/22 >> MRI brain: No acute findings.  COVID-19 medications: Steroids: 11/18>>11/25 Remdesivir: 11/18>>11/22 Monoclonal antibody: 11/16 x  1  Antibiotics: Vancomycin: 11/21>> 11/22 Rocephin: 11/22>> 11/24 Zyvox: 11/23>>  Microbiology data: 11/24>> blood culture: No growth. 11/20>> blood cultures: Staph hominis (1/2) 11/18 >>blood culture: Staph hominis. 11/18>> urine culture: <10,007 colonies/mL-insignificant growth  Procedures: None  Consults: None  DVT prophylaxis: Therapeutic Lovenox.    Subjective:   Remains calm-but still very confused today.  Per nursing staff-very poor to no oral intake-continues to refuse medications.   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  Suspect has some significant amount of radiation-induced fibrosis and a CT chest.  Has completed  a course of Remdesivir and steroids.  Fever: afebrile O2 requirements:  SpO2: 97 % O2 Flow Rate (L/min): 2 L/min     Recent Labs  Lab 04/16/20 0352 04/16/20 0352 04/17/20 0830 04/18/20 0149 04/19/20 0048 04/19/20 0401 04/20/20 0035 04/21/20 0101  WBC 12.7*   < > 9.8 11.7*  --  11.7* 10.7* 10.9*  HGB 11.6*   < > 13.5 12.3  --  12.3 13.5 13.2  HCT 37.1   < > 44.1 39.3  --  39.2 43.4 42.9  PLT 351   < > 444* 447*  --  419* 334 472*  CRP 4.2*   < > 3.1* 1.5* 0.7 0.7 0.7  --   BNP 103.8*  --  80.4 67.4  --  58.4 106.6*  --   DDIMER 2.87*   < > 2.87* 1.95* 2.20* 2.12* 2.40*  --   PROCALCITON  --   --  <0.10 <0.10  --  <0.10  --   --   AST 61*   < > 45* 31  --  29 34 31  ALT 35   < > 37 32  --  28 29 26   ALKPHOS 116   < > 119 109  --  101 92 100  BILITOT 1.0   < > 1.1 1.0  --  1.0 0.8 0.9  ALBUMIN 2.7*   < > 2.9* 2.7*  --  2.6* 3.0* 3.1*   < > = values in this interval not displayed.    COPD exacerbation: Resolved-moving air well-completed steroids 11/25-on bronchodilators.  Staph hominis bacteremia: Culture results as above-discussed with Dr. Carolyne Fiscal on-call on 11/25-chart reviewed over the  phone-recommends total of 7 days of Zyvox from 11/23.  Unfortunately-patient refusing some of her Zyvox dosage.  Acute metabolic encephalopathy: Most likely COVID-19 inflammation related encephalopathy, CT, MRI and EEG all nonacute.  No focal deficits.  Although improved-not still at baseline-encephalopathy seems to wax and wane at times-she unfortunately has started to develop failure to thrive symptoms with very poor oral intake.  All psychotropic medications on hold.  All narcotics on hold as well.  Fall: Secondary debility/deconditioning at baseline-superimposed weakness due to COVID-19 infection.  Appreciate PT/OT eval-daughter refusing SNF and wants to take patient home.  Anxiety/depression: Given confusion-all psychotropic medications on hold.      Sinus tachycardia: Improved-likely secondary to acute illness/agitation.  Chronic pain syndrome-opiate dependent: Given encephalopathy-all narcotics on hold-for the past several days-no signs of withdrawal symptoms.  Continue to monitor closely.   Squamous cell carcinoma of the lung-getting radiation treatments-resume outpatient follow-up with radiation oncology/medical oncology-could have some amount of fibrosis/inflammation in the lungs due to radiation.  On steroids.  Per daughter-patient on radiation treatment as was deemed to be a poor candidate for chemotherapy due to overall medical comorbidities/functional status.  Normocytic anemia: Close to baseline.  Follow periodically.  History of VTE: On Lovenox.  HTN: BP high, unable to take oral medications consistently, added Nitropaste and as needed hydralazine.  Monitor.  HLD: Continue statin  GERD: Continue PPI  Goals of care: DNR in place-admitted with COVID-19 infection/possible pneumonia-respiratory status has improved-she however continues to have some amount of lingering encephalopathy-and now exhibiting severe failure to thrive syndrome.  Per patient's daughter-patient's oral intake  was already very poor prior to this hospitalization.  Suspect may benefit from initiation of hospice care at some point-have consulted palliative care earlier today. Subsequently spoke with patient's daughter-she is exploring whether to take patient home with hospice or do  residential hospice. Will reach out to daughter again on 11/27.  GI prophylaxis: PPI   Condition - Stable  Family Communication  : Daughter [name] [phone]) over the phone on 11/26  Code Status :  Full Code  Diet :  Diet Order            Diet regular Room service appropriate? Yes; Fluid consistency: Thin  Diet effective now                  Disposition Plan  :  Status is: Inpatient  The patient will require care spanning > 2 midnights and should be moved to inpatient because: Inpatient level of care appropriate due to severity of illness  Dispo: The patient is from: Home              Anticipated d/c is to: TBD              Anticipated d/c date is: 2 days              Patient currently is not medically stable to d/c.   Barriers to discharge: Resolving encephalopathy-severe failure to thrive syndrome  Antimicorbials  :    Anti-infectives (From admission, onward)   Start     Dose/Rate Route Frequency Ordered Stop   04/18/20 2200  linezolid (ZYVOX) tablet 600 mg        600 mg Oral Every 12 hours 04/18/20 1624 04/30/20 0959   04/17/20 1130  cefTRIAXone (ROCEPHIN) 1 g in sodium chloride 0.9 % 100 mL IVPB        1 g 200 mL/hr over 30 Minutes Intravenous Every 24 hours 04/17/20 1039 04/19/20 1444   04/16/20 2000  vancomycin (VANCOREADY) IVPB 750 mg/150 mL  Status:  Discontinued       "Followed by" Linked Group Details   750 mg 150 mL/hr over 60 Minutes Intravenous Every 24 hours 04/15/20 1647 04/18/20 1624   04/16/20 1700  vancomycin (VANCOREADY) IVPB 750 mg/150 mL  Status:  Discontinued       "Followed by" Linked Group Details   750 mg 150 mL/hr over 60 Minutes Intravenous Every 24 hours 04/15/20  1403 04/15/20 1647   04/15/20 2000  vancomycin (VANCOCIN) IVPB 1000 mg/200 mL premix       "Followed by" Linked Group Details   1,000 mg 200 mL/hr over 60 Minutes Intravenous  Once 04/15/20 1647 04/16/20 0223   04/15/20 1700  vancomycin (VANCOCIN) IVPB 1000 mg/200 mL premix  Status:  Discontinued       "Followed by" Linked Group Details   1,000 mg 200 mL/hr over 60 Minutes Intravenous  Once 04/15/20 1403 04/15/20 1647   04/14/20 1000  remdesivir 100 mg in sodium chloride 0.9 % 100 mL IVPB       "Followed by" Linked Group Details   100 mg 200 mL/hr over 30 Minutes Intravenous Daily 04/13/20 2227 04/17/20 1555   04/13/20 2330  remdesivir 200 mg in sodium chloride 0.9% 250 mL IVPB       "Followed by" Linked Group Details   200 mg 580 mL/hr over 30 Minutes Intravenous Once 04/13/20 2227 04/14/20 0254      Inpatient Medications  Scheduled Meds: . vitamin C  500 mg Oral Daily  . benzonatate  200 mg Oral TID  . Chlorhexidine Gluconate Cloth  6 each Topical Daily  . [START ON 04/22/2020] enoxaparin (LOVENOX) injection  70 mg Subcutaneous Q12H  . feeding supplement  237 mL Oral BID BM  .  linezolid  600 mg Oral Q12H  . metoprolol tartrate  50 mg Oral BID  . nitroGLYCERIN  1 inch Topical Q6H  . pantoprazole  40 mg Oral BID  . pravastatin  10 mg Oral q1800  . zinc sulfate  220 mg Oral Daily   Continuous Infusions:  PRN Meds:.acetaminophen, albuterol, haloperidol lactate, hydrALAZINE, Ipratropium-Albuterol, [DISCONTINUED] ondansetron **OR** ondansetron (ZOFRAN) IV, polyethylene glycol   Time Spent in minutes  25  See all Orders from today for further details   Oren Binet M.D on 04/21/2020 at 2:45 PM  To page go to [email or web address] - use universal password  [facility] -  Office  [phone]    Objective:   Vitals:   04/20/20 1300 04/20/20 2116 04/21/20 0526 04/21/20 1327  BP: (!) 146/99 123/67 120/65 (!) 156/93  Pulse: 96 98 68 97  Resp: 17 17 18 20   Temp: 97.8  F (36.6 C) 99 F (37.2 C) 97.9 F (36.6 C) 98.5 F (36.9 C)  TempSrc: Axillary Oral Oral Axillary  SpO2: 92% 93% 97%   Weight:      Height:        Wt Readings from Last 3 Encounters:  04/13/20 68 kg     Intake/Output Summary (Last 24 hours) at 04/21/2020 1445 Last data filed at 04/21/2020 1439 Gross per 24 hour  Intake 594 ml  Output 550 ml  Net 44 ml     Physical Exam Gen Exam: Remains confused-but not in any distress. HEENT:atraumatic, normocephalic Chest: B/L clear to auscultation anteriorly CVS:S1S2 regular Abdomen:soft non tender, non distended Extremities:no edema Neurology: Moving all 4 extremities. Skin: no rash    Data Review:    CBC Recent Labs  Lab 04/17/20 0830 04/18/20 0149 04/19/20 0401 04/20/20 0035 04/21/20 0101  WBC 9.8 11.7* 11.7* 10.7* 10.9*  HGB 13.5 12.3 12.3 13.5 13.2  HCT 44.1 39.3 39.2 43.4 42.9  PLT 444* 447* 419* 334 472*  MCV 82.9 82.6 82.2 83.9 83.0  MCH 25.4* 25.8* 25.8* 26.1 25.5*  MCHC 30.6 31.3 31.4 31.1 30.8  RDW 19.3* 18.6* 18.6* 18.9* 18.7*  LYMPHSABS 0.5* 0.8 1.0 0.6* 0.9  MONOABS 0.5 1.0 1.3* 0.3 1.3*  EOSABS 0.0 0.0 0.0 0.0 0.0  BASOSABS 0.0 0.0 0.0 0.0 0.0    Chemistries  Recent Labs  Lab 04/16/20 0352 04/16/20 0352 04/17/20 0830 04/18/20 0149 04/19/20 0401 04/20/20 0035 04/21/20 0101  NA 140   < > 140 138 140 140 144  K 3.2*   < > 3.5 3.2* 3.4* 4.1 4.0  CL 104   < > 105 102 105 106 105  CO2 22   < > 21* 20* 21* 19* 23  GLUCOSE 108*   < > 99 105* 112* 89 128*  BUN 14   < > 21 27* 28* 25* 33*  CREATININE 0.88   < > 1.02* 0.91 0.90 0.74 1.00  CALCIUM 9.2   < > 9.9 9.4 9.4 9.6 10.4*  MG 1.9  --  2.0 2.0 2.1 2.1  --   AST 61*   < > 45* 31 29 34 31  ALT 35   < > 37 32 28 29 26   ALKPHOS 116   < > 119 109 101 92 100  BILITOT 1.0   < > 1.1 1.0 1.0 0.8 0.9   < > = values in this interval not displayed.    ------------------------------------------------------------------------------------------------------------------ No results for input(s): CHOL, HDL, LDLCALC, TRIG, CHOLHDL, LDLDIRECT in the last 72 hours.  No results found  for: HGBA1C ------------------------------------------------------------------------------------------------------------------ No results for input(s): TSH, T4TOTAL, T3FREE, THYROIDAB in the last 72 hours.  Invalid input(s): FREET3 ------------------------------------------------------------------------------------------------------------------ No results for input(s): VITAMINB12, FOLATE, FERRITIN, TIBC, IRON, RETICCTPCT in the last 72 hours.  Coagulation profile No results for input(s): INR, PROTIME in the last 168 hours.  Recent Labs    04/19/20 0401 04/20/20 0035  DDIMER 2.12* 2.40*    Cardiac Enzymes No results for input(s): CKMB, TROPONINI, MYOGLOBIN in the last 168 hours.  Invalid input(s): CK ------------------------------------------------------------------------------------------------------------------    Component Value Date/Time   BNP 106.6 (H) 04/20/2020 0035    Micro Results Recent Results (from the past 240 hour(s))  Culture, Urine     Status: Abnormal   Collection Time: 04/13/20  6:59 AM   Specimen: Urine, Random  Result Value Ref Range Status   Specimen Description URINE, RANDOM  Final   Special Requests NONE  Final   Culture (A)  Final    <10,000 COLONIES/mL INSIGNIFICANT GROWTH Performed at [facility], [address] [place], [place] [address]    Report Status 04/15/2020 FINAL  Final  Blood Culture (routine x 2)     Status: Abnormal   Collection Time: 04/13/20  7:00 PM   Specimen: BLOOD  Result Value Ref Range Status   Specimen Description BLOOD SITE NOT SPECIFIED  Final   Special Requests   Final    BOTTLES DRAWN AEROBIC ONLY Blood Culture results may not be optimal due to an inadequate volume of blood received in  culture bottles   Culture  Setup Time   Final    GRAM POSITIVE COCCI AEROBIC BOTTLE ONLY Organism ID to follow CRITICAL RESULT CALLED TO, READ BACK BY AND VERIFIED WITH: PHARMD M [place] 04/14/20 AT 1853 SK    Culture (A)  Final    STAPHYLOCOCCUS HOMINIS THE SIGNIFICANCE OF ISOLATING THIS ORGANISM FROM A SINGLE SET OF BLOOD CULTURES WHEN MULTIPLE SETS ARE DRAWN IS UNCERTAIN. PLEASE NOTIFY THE MICROBIOLOGY DEPARTMENT WITHIN ONE WEEK IF SPECIATION AND SENSITIVITIES ARE REQUIRED. Performed at [facility], [place] [address] [place], [place] [address]    Report Status 04/15/2020 FINAL  Final  Blood Culture ID Panel (Reflexed)     Status: Abnormal   Collection Time: 04/13/20  7:00 PM  Result Value Ref Range Status   Enterococcus faecalis NOT DETECTED NOT DETECTED Final   Enterococcus Faecium NOT DETECTED NOT DETECTED Final   Listeria monocytogenes NOT DETECTED NOT DETECTED Final   Staphylococcus species DETECTED (A) NOT DETECTED Final    Comment: CRITICAL RESULT CALLED TO, READ BACK BY AND VERIFIED WITH: PHARMD M [place] 04/14/20 AT 1853 SK    Staphylococcus aureus (BCID) NOT DETECTED NOT DETECTED Final   Staphylococcus epidermidis NOT DETECTED NOT DETECTED Final   Staphylococcus lugdunensis NOT DETECTED NOT DETECTED Final   Streptococcus species NOT DETECTED NOT DETECTED Final   Streptococcus agalactiae NOT DETECTED NOT DETECTED Final   Streptococcus pneumoniae NOT DETECTED NOT DETECTED Final   Streptococcus pyogenes NOT DETECTED NOT DETECTED Final   A.calcoaceticus-baumannii NOT DETECTED NOT DETECTED Final   Bacteroides fragilis NOT DETECTED NOT DETECTED Final   Enterobacterales NOT DETECTED NOT DETECTED Final   Enterobacter cloacae complex NOT DETECTED NOT DETECTED Final   Escherichia coli NOT DETECTED NOT DETECTED Final   Klebsiella aerogenes NOT DETECTED NOT DETECTED Final   Klebsiella oxytoca NOT DETECTED NOT DETECTED Final   Klebsiella pneumoniae NOT DETECTED NOT DETECTED  Final   Proteus species NOT DETECTED NOT DETECTED Final  Salmonella species NOT DETECTED NOT DETECTED Final   Serratia marcescens NOT DETECTED NOT DETECTED Final   Haemophilus influenzae NOT DETECTED NOT DETECTED Final   Neisseria meningitidis NOT DETECTED NOT DETECTED Final   Pseudomonas aeruginosa NOT DETECTED NOT DETECTED Final   Stenotrophomonas maltophilia NOT DETECTED NOT DETECTED Final   Candida albicans NOT DETECTED NOT DETECTED Final   Candida auris NOT DETECTED NOT DETECTED Final   Candida glabrata NOT DETECTED NOT DETECTED Final   Candida krusei NOT DETECTED NOT DETECTED Final   Candida parapsilosis NOT DETECTED NOT DETECTED Final   Candida tropicalis NOT DETECTED NOT DETECTED Final   Cryptococcus neoformans/gattii NOT DETECTED NOT DETECTED Final    Comment: Performed at [facility], [place] [address] [place], [place] [address]  Culture, blood (routine x 2)     Status: Abnormal   Collection Time: 04/15/20  6:06 PM   Specimen: BLOOD LEFT ARM  Result Value Ref Range Status   Specimen Description BLOOD LEFT ARM  Final   Special Requests   Final    BOTTLES DRAWN AEROBIC ONLY Blood Culture results may not be optimal due to an inadequate volume of blood received in culture bottles   Culture  Setup Time   Final    GRAM POSITIVE COCCI IN CLUSTERS AEROBIC BOTTLE ONLY CRITICAL VALUE NOTED.  VALUE IS CONSISTENT WITH PREVIOUSLY REPORTED AND CALLED VALUE. Performed at [facility], [place] [address] [place], [place] [address]    Culture STAPHYLOCOCCUS HOMINIS (A)  Final   Report Status 04/18/2020 FINAL  Final   Organism ID, Bacteria STAPHYLOCOCCUS HOMINIS  Final      Susceptibility   Staphylococcus hominis - MIC*    CIPROFLOXACIN >=8 RESISTANT Resistant     ERYTHROMYCIN >=8 RESISTANT Resistant     GENTAMICIN <=0.5 SENSITIVE Sensitive     OXACILLIN >=4 RESISTANT Resistant     TETRACYCLINE >=16 RESISTANT Resistant     VANCOMYCIN <=0.5 SENSITIVE Sensitive      TRIMETH/SULFA 80 RESISTANT Resistant     CLINDAMYCIN RESISTANT Resistant     RIFAMPIN <=0.5 SENSITIVE Sensitive     Inducible Clindamycin POSITIVE Resistant     LINEZOLID Value in next row Sensitive      SENSITIVE2    * STAPHYLOCOCCUS HOMINIS  Culture, blood (routine x 2)     Status: None   Collection Time: 04/15/20  6:45 PM   Specimen: BLOOD  Result Value Ref Range Status   Specimen Description BLOOD SITE NOT SPECIFIED  Final   Special Requests   Final    BOTTLES DRAWN AEROBIC AND ANAEROBIC Blood Culture adequate volume   Culture   Final    NO GROWTH 5 DAYS Performed at [facility], [address], [place], [place] [address]    Report Status 04/20/2020 FINAL  Final  Culture, blood (routine x 2)     Status: None (Preliminary result)   Collection Time: 04/19/20  1:03 PM   Specimen: BLOOD  Result Value Ref Range Status   Specimen Description BLOOD LEFT ANTECUBITAL  Final   Special Requests   Final    BOTTLES DRAWN AEROBIC ONLY Blood Culture results may not be optimal due to an inadequate volume of blood received in culture bottles   Culture   Final    NO GROWTH < 24 HOURS Performed at [facility], [place] [address], [place], [place] [address]    Report Status PENDING  Incomplete  Culture, blood (routine x 2)  Status: None (Preliminary result)   Collection Time: 04/19/20  6:26 PM   Specimen: BLOOD  Result Value Ref Range Status   Specimen Description BLOOD LEFT ANTECUBITAL  Final   Special Requests   Final    BOTTLES DRAWN AEROBIC AND ANAEROBIC Blood Culture results may not be optimal due to an excessive volume of blood received in culture bottles   Culture   Final    NO GROWTH < 12 HOURS Performed at [facility], [place] [address] [place], [place] [address]    Report Status PENDING  Incomplete    Radiology Reports CT Head Wo Contrast  Result Date: 04/13/2020 CLINICAL DATA:  79 year old female with head trauma. EXAM: CT HEAD WITHOUT CONTRAST CT  CERVICAL SPINE WITHOUT CONTRAST TECHNIQUE: Multidetector CT imaging of the head and cervical spine was performed following the standard protocol without intravenous contrast. Multiplanar CT image reconstructions of the cervical spine were also generated. COMPARISON:  CT dated 08/28/2019. FINDINGS: CT HEAD FINDINGS Brain: Mild age-related atrophy and chronic microvascular ischemic changes. There is no acute intracranial hemorrhage. No mass effect or midline shift. No extra-axial fluid collection. Vascular: No hyperdense vessel or unexpected calcification. Skull: Normal. Negative for fracture or focal lesion. Sinuses/Orbits: No acute finding. Other: None CT CERVICAL SPINE FINDINGS Alignment: No acute subluxation. Skull base and vertebrae: No acute fracture. Osteopenia. Soft tissues and spinal canal: No prevertebral fluid or swelling. No visible canal hematoma. Disc levels:  Degenerative changes. No acute findings. Upper chest: Small left upper lobe calcified granuloma. Partially visualized right subclavian central venous line. Other: Bilateral carotid bulb calcified plaques. IMPRESSION: 1. No acute intracranial pathology. Mild age-related atrophy and chronic microvascular ischemic changes. 2. No acute/traumatic cervical spine pathology. Electronically Signed   By: Anner Crete M.D.   On: 04/13/2020 20:44   CT CHEST WO CONTRAST  Result Date: 04/14/2020 CLINICAL DATA:  Respiratory illness EXAM: CT CHEST WITHOUT CONTRAST TECHNIQUE: Multidetector CT imaging of the chest was performed following the standard protocol without IV contrast. COMPARISON:  Chest x-ray 04/13/2020.  CT 04/11/2020 FINDINGS: Cardiovascular: Aortic atherosclerosis. Scattered coronary artery calcifications. Heart is normal size and aorta is normal caliber. Mediastinum/Nodes: Small scattered non pathologically enlarged mediastinal lymph nodes, stable. No mediastinal, hilar, or axillary adenopathy. Trachea and esophagus are unremarkable. Thyroid  unremarkable. Lungs/Pleura: Peripheral interstitial prominence in thickening compatible with fibrosis, stable. Rounded partially necrotic mass in the posterior right lower lobe measures 3.1 cm and is slightly decreased in size since prior study when this measured 3.8 cm. No effusions. No confluent opacities on the left. Calcified granuloma in the left upper lobe. Upper Abdomen: Imaging into the upper abdomen demonstrates no acute findings. Musculoskeletal: Chest wall soft tissues are unremarkable. Right upper chest wall Port-A-Cath remains in place, unchanged. No acute bony abnormality. Old posterior right 7th rib fracture and upper thoracic compression fractures, unchanged. IMPRESSION: Partially necrotic mass in the posterior right lower lobe is again noted, slightly decreased in size since prior study, 3.1 cm currently compared to 3.8 cm previously. Changes of fibrosis throughout the lungs. Coronary artery disease. Aortic Atherosclerosis (ICD10-I70.0). Electronically Signed   By: Rolm Baptise M.D.   On: 04/14/2020 18:33   CT Cervical Spine Wo Contrast  Result Date: 04/13/2020 CLINICAL DATA:  79 year old female with head trauma. EXAM: CT HEAD WITHOUT CONTRAST CT CERVICAL SPINE WITHOUT CONTRAST TECHNIQUE: Multidetector CT imaging of the head and cervical spine was performed following the standard protocol without intravenous contrast. Multiplanar CT image reconstructions of the cervical spine were  also generated. COMPARISON:  CT dated 08/28/2019. FINDINGS: CT HEAD FINDINGS Brain: Mild age-related atrophy and chronic microvascular ischemic changes. There is no acute intracranial hemorrhage. No mass effect or midline shift. No extra-axial fluid collection. Vascular: No hyperdense vessel or unexpected calcification. Skull: Normal. Negative for fracture or focal lesion. Sinuses/Orbits: No acute finding. Other: None CT CERVICAL SPINE FINDINGS Alignment: No acute subluxation. Skull base and vertebrae: No acute  fracture. Osteopenia. Soft tissues and spinal canal: No prevertebral fluid or swelling. No visible canal hematoma. Disc levels:  Degenerative changes. No acute findings. Upper chest: Small left upper lobe calcified granuloma. Partially visualized right subclavian central venous line. Other: Bilateral carotid bulb calcified plaques. IMPRESSION: 1. No acute intracranial pathology. Mild age-related atrophy and chronic microvascular ischemic changes. 2. No acute/traumatic cervical spine pathology. Electronically Signed   By: Anner Crete M.D.   On: 04/13/2020 20:44   MR BRAIN WO CONTRAST  Result Date: 04/17/2020 CLINICAL DATA:  Coronavirus infection. Altered mental status. History of squamous cell carcinoma of the lung. EXAM: MRI HEAD WITHOUT CONTRAST TECHNIQUE: Multiplanar, multiecho pulse sequences of the brain and surrounding structures were obtained without intravenous contrast. COMPARISON:  Head CT 04/13/2020.  MRI 03/04/2020. FINDINGS: Brain: Diffusion imaging does not show any acute or subacute infarction or other cause of restricted diffusion. No abnormality affects the brainstem or cerebellum. Cerebral hemispheres show age related atrophy with mild chronic small-vessel ischemic changes of the white matter, often seen at this age. No cortical or large vessel territory infarction. No mass lesion, hemorrhage, hydrocephalus or extra-axial collection. Vascular: Major vessels at the base of the brain show flow. Skull and upper cervical spine: Negative Sinuses/Orbits: Sinuses are clear except for a small amount of fluid layering in the left division of the sphenoid sinus, not likely significant. Orbits negative. Previous lens implant on right Other: None IMPRESSION: No acute finding. Age related atrophy. 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 Electronically Signed   By: Nelson Chimes M.D.   On: 04/17/2020 10:50   DG Pelvis Portable  Result Date:  04/13/2020 CLINICAL DATA:  Fall EXAM: PORTABLE PELVIS 1-2 VIEWS COMPARISON:  None. FINDINGS: Prior right hip replacement. Normal AP alignment. Mild degenerative changes within the left hip. SI joints symmetric and unremarkable. No acute bony abnormality. Specifically, no fracture, subluxation, or dislocation. IMPRESSION: No acute bony abnormality. Electronically Signed   By: Rolm Baptise M.D.   On: 04/13/2020 19:21   DG Chest Port 1 View  Result Date: 04/13/2020 CLINICAL DATA:  Fall, COVID EXAM: PORTABLE CHEST 1 VIEW COMPARISON:  04/11/2020 FINDINGS: Right Port-A-Cath remains in place, unchanged. Heart is normal size. Low lung volumes. Diffuse interstitial prominence throughout the lungs compatible with chronic lung disease/fibrosis. No definite acute confluent airspace opacity. Previously seen rounded mass posteriorly in the right lung better seen on prior CT. No effusions. IMPRESSION: Stable appearance of the chest. Electronically Signed   By: Rolm Baptise M.D.   On: 04/13/2020 19:21   DG Ankle Right Port  Result Date: 04/17/2020 CLINICAL DATA:  79 year old female with soft tissue swelling of the lateral malleolus. No known injury. EXAM: PORTABLE RIGHT ANKLE - 2 VIEW; RIGHT FOOT - 2 VIEW COMPARISON:  None. FINDINGS: There is no acute fracture or dislocation. The bones are osteopenic. The ankle mortise is intact. The soft tissues are unremarkable. IMPRESSION: No acute fracture or dislocation. Electronically Signed   By: Anner Crete M.D.   On: 04/17/2020 21:16   DG Foot  2 Views Right  Result Date: 04/17/2020 CLINICAL DATA:  79 year old female with soft tissue swelling of the lateral malleolus. No known injury. EXAM: PORTABLE RIGHT ANKLE - 2 VIEW; RIGHT FOOT - 2 VIEW COMPARISON:  None. FINDINGS: There is no acute fracture or dislocation. The bones are osteopenic. The ankle mortise is intact. The soft tissues are unremarkable. IMPRESSION: No acute fracture or dislocation. Electronically Signed    By: Anner Crete M.D.   On: 04/17/2020 21:16   EEG adult  Result Date: 04/16/2020 Lora Havens, MD     04/16/2020  1:37 PM Patient Name: ALEAH AHLGRIM MRN: [id number] Epilepsy Attending: Lora Havens Referring Physician/Provider: Dr Oren Binet Date: 04/16/2020 Duration: 25.12 mins Patient history: 79yo F with ams. EEG to evaluate for seizure. Level of alertness: Awake AEDs during EEG study: xanax, pregabalin Technical aspects: This EEG study was done with scalp electrodes positioned according to the 10-20 International system of electrode placement. Electrical activity was acquired at a sampling rate of 500Hz  and reviewed with a high frequency filter of 70Hz  and a low frequency filter of 1Hz . EEG data were recorded continuously and digitally stored. Description: The posterior dominant rhythm consists of 8 Hz activity of moderate voltage (25-35 uV) seen predominantly in posterior head regions, symmetric and reactive to eye opening and eye closing.  EEG showed intermittent generalized 3 to 6 Hz theta-delta slowing. Triphasic waves, generalized were also noted. Hyperventilation and photic stimulation were not performed.   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 No seizures or epileptiform discharges were seen throughout the recording. [place]   DG HIP UNILAT WITH PELVIS 2-3 VIEWS RIGHT  Result Date: 04/18/2020 CLINICAL DATA:  Acute onset right hip pain. No known injury. History of prior right hip replacement. EXAM: DG HIP (WITH OR WITHOUT PELVIS) 2-3V RIGHT COMPARISON:  Plain film of the pelvis 04/13/2020. PET CT scan 01/25/2020. FINDINGS: The first image which is the AP view is over penetrated. Right hip arthroplasty is in place. No hardware complication. No fracture, dislocation or  focal bony lesion. Soft tissues are negative. IMPRESSION: No acute abnormality.  Status post right hip replacement. Electronically Signed   By: Inge Rise M.D.   On: 04/18/2020 15:03

## 2020-04-22 DIAGNOSIS — U071 COVID-19: Secondary | ICD-10-CM | POA: Diagnosis not present

## 2020-04-22 DIAGNOSIS — W19XXXA Unspecified fall, initial encounter: Secondary | ICD-10-CM | POA: Diagnosis not present

## 2020-04-22 DIAGNOSIS — J9611 Chronic respiratory failure with hypoxia: Secondary | ICD-10-CM | POA: Diagnosis not present

## 2020-04-22 DIAGNOSIS — J441 Chronic obstructive pulmonary disease with (acute) exacerbation: Secondary | ICD-10-CM | POA: Diagnosis not present

## 2020-04-22 LAB — CBC WITH DIFFERENTIAL/PLATELET
Abs Immature Granulocytes: 0.12 10*3/uL — ABNORMAL HIGH (ref 0.00–0.07)
Basophils Absolute: 0 10*3/uL (ref 0.0–0.1)
Basophils Relative: 0 %
Eosinophils Absolute: 0 10*3/uL (ref 0.0–0.5)
Eosinophils Relative: 0 %
HCT: 40 % (ref 36.0–46.0)
Hemoglobin: 12.7 g/dL (ref 12.0–15.0)
Immature Granulocytes: 1 %
Lymphocytes Relative: 9 %
Lymphs Abs: 0.9 10*3/uL (ref 0.7–4.0)
MCH: 26.1 pg (ref 26.0–34.0)
MCHC: 31.8 g/dL (ref 30.0–36.0)
MCV: 82.1 fL (ref 80.0–100.0)
Monocytes Absolute: 1.1 10*3/uL — ABNORMAL HIGH (ref 0.1–1.0)
Monocytes Relative: 11 %
Neutro Abs: 7.8 10*3/uL — ABNORMAL HIGH (ref 1.7–7.7)
Neutrophils Relative %: 79 %
Platelets: 394 10*3/uL (ref 150–400)
RBC: 4.87 MIL/uL (ref 3.87–5.11)
RDW: 19.2 % — ABNORMAL HIGH (ref 11.5–15.5)
WBC: 10 10*3/uL (ref 4.0–10.5)
nRBC: 0.2 % (ref 0.0–0.2)

## 2020-04-22 LAB — COMPREHENSIVE METABOLIC PANEL
ALT: 28 U/L (ref 0–44)
AST: 50 U/L — ABNORMAL HIGH (ref 15–41)
Albumin: 2.9 g/dL — ABNORMAL LOW (ref 3.5–5.0)
Alkaline Phosphatase: 82 U/L (ref 38–126)
Anion gap: 13 (ref 5–15)
BUN: 33 mg/dL — ABNORMAL HIGH (ref 8–23)
CO2: 20 mmol/L — ABNORMAL LOW (ref 22–32)
Calcium: 9.5 mg/dL (ref 8.9–10.3)
Chloride: 112 mmol/L — ABNORMAL HIGH (ref 98–111)
Creatinine, Ser: 0.95 mg/dL (ref 0.44–1.00)
GFR, Estimated: >60 mL/min (ref >60)
Glucose, Bld: 125 mg/dL — ABNORMAL HIGH (ref 70–99)
Potassium: 4.1 mmol/L (ref 3.5–5.1)
Sodium: 145 mmol/L (ref 135–145)
Total Bilirubin: 1.5 mg/dL — ABNORMAL HIGH (ref 0.3–1.2)
Total Protein: 7.5 g/dL (ref 6.5–8.1)

## 2020-04-22 MED ORDER — TRAZODONE HCL 50 MG PO TABS
50.0000 mg | ORAL_TABLET | Freq: Every day | ORAL | Status: DC
  Administered 2020-04-22: 50 mg via ORAL
  Filled 2020-04-22: qty 1

## 2020-04-22 MED ORDER — PREGABALIN 100 MG PO CAPS
100.0000 mg | ORAL_CAPSULE | Freq: Two times a day (BID) | ORAL | Status: DC
  Administered 2020-04-22: 100 mg via ORAL
  Filled 2020-04-22: qty 1

## 2020-04-22 MED ORDER — DILTIAZEM LOAD VIA INFUSION
10.0000 mg | Freq: Once | INTRAVENOUS | Status: AC
  Administered 2020-04-22: 10 mg via INTRAVENOUS
  Filled 2020-04-22: qty 10

## 2020-04-22 MED ORDER — DULOXETINE HCL 30 MG PO CPEP
30.0000 mg | ORAL_CAPSULE | ORAL | Status: DC
  Administered 2020-04-23: 30 mg via ORAL
  Filled 2020-04-22: qty 1

## 2020-04-22 MED ORDER — ALPRAZOLAM 0.5 MG PO TABS: 0.5000 mg | ORAL_TABLET | Freq: Every day | ORAL | Status: DC

## 2020-04-22 MED ORDER — MORPHINE SULFATE (PF) 2 MG/ML IV SOLN: 1.0000 mg | INTRAVENOUS | Status: DC | PRN

## 2020-04-22 MED ORDER — DILTIAZEM HCL-DEXTROSE 125-5 MG/125ML-% IV SOLN (PREMIX)
5.0000 mg/h | INTRAVENOUS | Status: DC
  Administered 2020-04-22: 5 mg/h via INTRAVENOUS
  Administered 2020-04-23: 15 mg/h via INTRAVENOUS
  Filled 2020-04-22 (×3): qty 125

## 2020-04-22 NOTE — Progress Notes (Addendum)
PROGRESS NOTE                                                                                                                                                                                                             Patient Demographics:    [name], is a 79 y.o. female, DOB - 1940/06/21, WYO:[id number]  Outpatient Primary MD for the patient is [name], FNP   Admit date - 04/13/2020   LOS - 8  Chief Complaint  Patient presents with  . Fall       Brief Narrative: Patient is a 79 y.o. 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 Hospital course complicated by lingering/waxing/waning encephalopathy-and severe failure to thrive syndrome with very poor oral intake.  COVID-19 vaccinated status: Unvaccinated  Significant Events: 11/18>> Admit to [facility] for weakness/confusion/COVID-19 pneumonia  Significant studies: 11/18>> chest x-ray: Diffuse interstitial prominence throughout both lungs compatible with chronic lung disease/fibrosis-no definite airspace opacity 11/18>> pelvic x-ray: No acute bony abnormality. 11/18>> CT head/C-spine: No acute abnormality in head/no fractures in C-spine 11/19>> CT chest: Partially necrotic mass in the right lower lobe-fibrosis throughout both lungs.  CAD. 11/21>> EEG - This study is suggestive of mild diffuse encephalopathy, nonspecific etiology but likely related to toxic-metabolic etiology, anoxic/hypoxic brain injury. No seizures or epileptiform discharges were seen throughout the recording. 11/22 >> MRI brain: No acute findings.  COVID-19 medications: Steroids: 11/18>>11/25 Remdesivir: 11/18>>11/22 Monoclonal antibody: 11/16 x  1  Antibiotics: Vancomycin: 11/21>> 11/22 Rocephin: 11/22>> 11/24 Zyvox: 11/23>>  Microbiology data: 11/24>> blood culture: No growth. 11/20>> blood cultures: Staph hominis (1/2) 11/18 >>blood culture: Staph hominis. 11/18>> urine culture: <10,007 colonies/mL-insignificant growth  Procedures: None  Consults: None  DVT prophylaxis: Therapeutic Lovenox.    Subjective:   Did not want to talk this morning-after a painful stimuli-she finally yelled out.  Moving all 4 extremities.  Still confused-attempting to get out of bed at times per RN.   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  Suspect has some  significant amount of radiation-induced fibrosis and a CT chest.  Has completed a course of Remdesivir and steroids.  Fever: afebrile O2 requirements:  SpO2: 94 % O2 Flow Rate (L/min): 2 L/min     Recent Labs  Lab 04/16/20 0352 04/16/20 0352 04/17/20 0830 04/17/20 0830 04/18/20 0149 04/19/20 0048 04/19/20 0401 04/20/20 0035 04/21/20 0101 04/22/20 0412  WBC 12.7*   < > 9.8   < > 11.7*  --  11.7* 10.7* 10.9* 10.0  HGB 11.6*   < > 13.5   < > 12.3  --  12.3 13.5 13.2 12.7  HCT 37.1   < > 44.1   < > 39.3  --  39.2 43.4 42.9 40.0  PLT 351   < > 444*   < > 447*  --  419* 334 472* 394  CRP 4.2*   < > 3.1*  --  1.5* 0.7 0.7 0.7  --   --   BNP 103.8*  --  80.4  --  67.4  --  58.4 106.6*  --   --   DDIMER 2.87*   < > 2.87*  --  1.95* 2.20* 2.12* 2.40*  --   --   PROCALCITON  --   --  <0.10  --  <0.10  --  <0.10  --   --   --   AST 61*   < > 45*   < > 31  --  29 34 31 50*  ALT 35   < > 37   < > 32  --  28 29 26 28   ALKPHOS 116   < > 119   < > 109  --  101 92 100 82  BILITOT 1.0   < > 1.1   < > 1.0  --  1.0 0.8 0.9 1.5*  ALBUMIN 2.7*   < > 2.9*   < > 2.7*  --  2.6* 3.0* 3.1* 2.9*   < > = values in this interval not displayed.    COPD  exacerbation: Resolved-moving air well-completed steroids 11/25-on bronchodilators.  Staph hominis bacteremia: Culture results as above-discussed with Dr. Carolyne Fiscal on-call on 11/25-chart reviewed over the phone-recommends total of 7 days of Zyvox from 11/23.  Unfortunately-patient refusing some of her Zyvox dosage.  Acute metabolic encephalopathy: Most likely COVID-19 inflammation related encephalopathy, CT, MRI and EEG all nonacute.  No focal deficits.  Although improved-not still at baseline-encephalopathy seems to wax and wane at times-she unfortunately has started to develop failure to thrive symptoms with very poor oral intake.  Fall: Secondary debility/deconditioning at baseline-superimposed weakness due to COVID-19 infection.  Appreciate PT/OT eval-recommendations of SNF.  Anxiety/depression: All psychotropic medications remain on hold-received mental status improves-however no improvement-restart Xanax/Cymbalta-although at a lower dose than usual.  Addendem Informed by Pharmacy-significant interaction with Zyvox and her psych meds that were restarted today-has not been taking Zyvox for the past 2 days-she is confused-suspect better to continue her psych meds-given her clinical situation-will go ahead and stop zyvox    Sinus tachycardia: Improved-likely secondary to acute illness/agitation.  Chronic pain syndrome-opiate dependent: Given encephalopathy-all narcotics on hold-for the past several days-no signs of withdrawal symptoms.  Continue to monitor closely.   Squamous cell carcinoma of the lung-getting radiation treatments-resume outpatient follow-up with radiation oncology/medical oncology-could have some amount of fibrosis/inflammation in the lungs due to radiation.  On steroids.  Per daughter-patient on radiation treatment as was deemed to be a poor candidate for chemotherapy due to overall medical comorbidities/functional status.  Normocytic  anemia: Close to baseline.  Follow  periodically.  History of VTE: On Lovenox.  HTN: BP high, unable to take oral medications consistently, added Nitropaste and as needed hydralazine.  Monitor.  HLD: Continue statin  GERD: Continue PPI  Goals of care: DNR in place-admitted with COVID-19 infection/possible pneumonia-respiratory status has improved-she however continues to have some amount of lingering encephalopathy-and now exhibiting severe failure to thrive syndrome.  Per patient's daughter-patient's oral intake was already very poor prior to this hospitalization.  Long discussion with patient's daughter Dayle Points is still struggling with appropriate disposition-she understands that further work-up/investigations/treatment would not really change outcome-she wants her mother kept comfortable as much as possible.  She will discuss with her brothers-regarding getting mom home with hospice care or opting for residential hospice.  I will touch base with her again on 11/28.  Appreciate palliative care evaluation on 11/26.   GI prophylaxis: PPI   Condition - Stable  Family Communication  : Daughter [name] [phone]) over the phone on 11/27  Code Status :  Full Code  Diet :  Diet Order            Diet regular Room service appropriate? Yes; Fluid consistency: Thin  Diet effective now                  Disposition Plan  :  Status is: Inpatient  The patient will require care spanning > 2 midnights and should be moved to inpatient because: Inpatient level of care appropriate due to severity of illness  Dispo: The patient is from: Home              Anticipated d/c is to: TBD              Anticipated d/c date is: 2 days              Patient currently is not medically stable to d/c.   Barriers to discharge: Resolving encephalopathy-severe failure to thrive syndrome  Antimicorbials  :    Anti-infectives (From admission, onward)   Start     Dose/Rate Route Frequency Ordered Stop   04/18/20 2200  linezolid  (ZYVOX) tablet 600 mg        600 mg Oral Every 12 hours 04/18/20 1624 04/30/20 0959   04/17/20 1130  cefTRIAXone (ROCEPHIN) 1 g in sodium chloride 0.9 % 100 mL IVPB        1 g 200 mL/hr over 30 Minutes Intravenous Every 24 hours 04/17/20 1039 04/19/20 1444   04/16/20 2000  vancomycin (VANCOREADY) IVPB 750 mg/150 mL  Status:  Discontinued       "Followed by" Linked Group Details   750 mg 150 mL/hr over 60 Minutes Intravenous Every 24 hours 04/15/20 1647 04/18/20 1624   04/16/20 1700  vancomycin (VANCOREADY) IVPB 750 mg/150 mL  Status:  Discontinued       "Followed by" Linked Group Details   750 mg 150 mL/hr over 60 Minutes Intravenous Every 24 hours 04/15/20 1403 04/15/20 1647   04/15/20 2000  vancomycin (VANCOCIN) IVPB 1000 mg/200 mL premix       "Followed by" Linked Group Details   1,000 mg 200 mL/hr over 60 Minutes Intravenous  Once 04/15/20 1647 04/16/20 0223   04/15/20 1700  vancomycin (VANCOCIN) IVPB 1000 mg/200 mL premix  Status:  Discontinued       "Followed by" Linked Group Details   1,000 mg 200 mL/hr over 60 Minutes Intravenous  Once 04/15/20 1403 04/15/20 1647   04/14/20  1000  remdesivir 100 mg in sodium chloride 0.9 % 100 mL IVPB       "Followed by" Linked Group Details   100 mg 200 mL/hr over 30 Minutes Intravenous Daily 04/13/20 2227 04/17/20 1555   04/13/20 2330  remdesivir 200 mg in sodium chloride 0.9% 250 mL IVPB       "Followed by" Linked Group Details   200 mg 580 mL/hr over 30 Minutes Intravenous Once 04/13/20 2227 04/14/20 0254      Inpatient Medications  Scheduled Meds: . vitamin C  500 mg Oral Daily  . benzonatate  200 mg Oral TID  . Chlorhexidine Gluconate Cloth  6 each Topical Daily  . enoxaparin (LOVENOX) injection  70 mg Subcutaneous Q12H  . feeding supplement  237 mL Oral BID BM  . linezolid  600 mg Oral Q12H  . metoprolol tartrate  50 mg Oral BID  . nitroGLYCERIN  1 inch Topical Q6H  . pantoprazole  40 mg Oral BID  . pravastatin  10 mg Oral  q1800  . zinc sulfate  220 mg Oral Daily   Continuous Infusions:  PRN Meds:.acetaminophen, albuterol, haloperidol lactate, hydrALAZINE, Ipratropium-Albuterol, [DISCONTINUED] ondansetron **OR** ondansetron (ZOFRAN) IV, polyethylene glycol   Time Spent in minutes  25  See all Orders from today for further details   Oren Binet M.D on 04/22/2020 at 1:44 PM  To page go to [email or web address] - use universal password  [facility] -  Office  [phone]    Objective:   Vitals:   04/21/20 1327 04/21/20 2029 04/22/20 0536 04/22/20 1336  BP: (!) 156/93 (!) 115/99 (!) 166/106 (!) 159/104  Pulse: 97 96 94 73  Resp: 20 (!) 22 19 20   Temp: 98.5 F (36.9 C) 98.1 F (36.7 C) 98 F (36.7 C) 97.8 F (36.6 C)  TempSrc: Axillary Axillary Axillary Axillary  SpO2:  97% 96% 94%  Weight:      Height:        Wt Readings from Last 3 Encounters:  04/13/20 68 kg     Intake/Output Summary (Last 24 hours) at 04/22/2020 1344 Last data filed at 04/22/2020 0640 Gross per 24 hour  Intake 297 ml  Output 350 ml  Net -53 ml     Physical Exam Gen Exam: Confused-attempting to get out of bed at times.  Spoke only 1 word when I was in the room HEENT:atraumatic, normocephalic Chest: B/L clear to auscultation anteriorly CVS:S1S2 regular Abdomen:soft non tender, non distended Extremities:no edema Neurology: Difficult exam-but clearly moving all 4 extremities symmetrically.   Skin: no rash    Data Review:    CBC Recent Labs  Lab 04/18/20 0149 04/19/20 0401 04/20/20 0035 04/21/20 0101 04/22/20 0412  WBC 11.7* 11.7* 10.7* 10.9* 10.0  HGB 12.3 12.3 13.5 13.2 12.7  HCT 39.3 39.2 43.4 42.9 40.0  PLT 447* 419* 334 472* 394  MCV 82.6 82.2 83.9 83.0 82.1  MCH 25.8* 25.8* 26.1 25.5* 26.1  MCHC 31.3 31.4 31.1 30.8 31.8  RDW 18.6* 18.6* 18.9* 18.7* 19.2*  LYMPHSABS 0.8 1.0 0.6* 0.9 0.9  MONOABS 1.0 1.3* 0.3 1.3* 1.1*  EOSABS 0.0 0.0 0.0 0.0 0.0  BASOSABS 0.0 0.0 0.0 0.0 0.0     Chemistries  Recent Labs  Lab 04/16/20 0352 04/16/20 0352 04/17/20 0830 04/17/20 0830 04/18/20 0149 04/19/20 0401 04/20/20 0035 04/21/20 0101 04/22/20 0412  NA 140   < > 140   < > 138 140 140 144 145  K 3.2*   < > 3.5   < >  3.2* 3.4* 4.1 4.0 4.1  CL 104   < > 105   < > 102 105 106 105 112*  CO2 22   < > 21*   < > 20* 21* 19* 23 20*  GLUCOSE 108*   < > 99   < > 105* 112* 89 128* 125*  BUN 14   < > 21   < > 27* 28* 25* 33* 33*  CREATININE 0.88   < > 1.02*   < > 0.91 0.90 0.74 1.00 0.95  CALCIUM 9.2   < > 9.9   < > 9.4 9.4 9.6 10.4* 9.5  MG 1.9  --  2.0  --  2.0 2.1 2.1  --   --   AST 61*   < > 45*   < > 31 29 34 31 50*  ALT 35   < > 37   < > 32 28 29 26 28   ALKPHOS 116   < > 119   < > 109 101 92 100 82  BILITOT 1.0   < > 1.1   < > 1.0 1.0 0.8 0.9 1.5*   < > = values in this interval not displayed.   ------------------------------------------------------------------------------------------------------------------ No results for input(s): CHOL, HDL, LDLCALC, TRIG, CHOLHDL, LDLDIRECT in the last 72 hours.  No results found for: HGBA1C ------------------------------------------------------------------------------------------------------------------ No results for input(s): TSH, T4TOTAL, T3FREE, THYROIDAB in the last 72 hours.  Invalid input(s): FREET3 ------------------------------------------------------------------------------------------------------------------ No results for input(s): VITAMINB12, FOLATE, FERRITIN, TIBC, IRON, RETICCTPCT in the last 72 hours.  Coagulation profile No results for input(s): INR, PROTIME in the last 168 hours.  Recent Labs    04/20/20 0035  DDIMER 2.40*    Cardiac Enzymes No results for input(s): CKMB, TROPONINI, MYOGLOBIN in the last 168 hours.  Invalid input(s): CK ------------------------------------------------------------------------------------------------------------------    Component Value Date/Time   BNP 106.6 (H)  04/20/2020 0035    Micro Results Recent Results (from the past 240 hour(s))  Culture, Urine     Status: Abnormal   Collection Time: 04/13/20  6:59 AM   Specimen: Urine, Random  Result Value Ref Range Status   Specimen Description URINE, RANDOM  Final   Special Requests NONE  Final   Culture (A)  Final    <10,000 COLONIES/mL INSIGNIFICANT GROWTH Performed at [facility], [address] [place], [place] [address]    Report Status 04/15/2020 FINAL  Final  Blood Culture (routine x 2)     Status: Abnormal   Collection Time: 04/13/20  7:00 PM   Specimen: BLOOD  Result Value Ref Range Status   Specimen Description BLOOD SITE NOT SPECIFIED  Final   Special Requests   Final    BOTTLES DRAWN AEROBIC ONLY Blood Culture results may not be optimal due to an inadequate volume of blood received in culture bottles   Culture  Setup Time   Final    GRAM POSITIVE COCCI AEROBIC BOTTLE ONLY Organism ID to follow CRITICAL RESULT CALLED TO, READ BACK BY AND VERIFIED WITH: PHARMD M [place] 04/14/20 AT 1853 SK    Culture (A)  Final    STAPHYLOCOCCUS HOMINIS THE SIGNIFICANCE OF ISOLATING THIS ORGANISM FROM A SINGLE SET OF BLOOD CULTURES WHEN MULTIPLE SETS ARE DRAWN IS UNCERTAIN. PLEASE NOTIFY THE MICROBIOLOGY DEPARTMENT WITHIN ONE WEEK IF SPECIATION AND SENSITIVITIES ARE REQUIRED. Performed at [facility], [place] [address] [place], [place] [address]    Report Status 04/15/2020 FINAL  Final  Blood Culture ID Panel (Reflexed)  Status: Abnormal   Collection Time: 04/13/20  7:00 PM  Result Value Ref Range Status   Enterococcus faecalis NOT DETECTED NOT DETECTED Final   Enterococcus Faecium NOT DETECTED NOT DETECTED Final   Listeria monocytogenes NOT DETECTED NOT DETECTED Final   Staphylococcus species DETECTED (A) NOT DETECTED Final    Comment: CRITICAL RESULT CALLED TO, READ BACK BY AND VERIFIED WITH: PHARMD M [place] 04/14/20 AT 1853 SK    Staphylococcus aureus (BCID) NOT DETECTED NOT  DETECTED Final   Staphylococcus epidermidis NOT DETECTED NOT DETECTED Final   Staphylococcus lugdunensis NOT DETECTED NOT DETECTED Final   Streptococcus species NOT DETECTED NOT DETECTED Final   Streptococcus agalactiae NOT DETECTED NOT DETECTED Final   Streptococcus pneumoniae NOT DETECTED NOT DETECTED Final   Streptococcus pyogenes NOT DETECTED NOT DETECTED Final   A.calcoaceticus-baumannii NOT DETECTED NOT DETECTED Final   Bacteroides fragilis NOT DETECTED NOT DETECTED Final   Enterobacterales NOT DETECTED NOT DETECTED Final   Enterobacter cloacae complex NOT DETECTED NOT DETECTED Final   Escherichia coli NOT DETECTED NOT DETECTED Final   Klebsiella aerogenes NOT DETECTED NOT DETECTED Final   Klebsiella oxytoca NOT DETECTED NOT DETECTED Final   Klebsiella pneumoniae NOT DETECTED NOT DETECTED Final   Proteus species NOT DETECTED NOT DETECTED Final   Salmonella species NOT DETECTED NOT DETECTED Final   Serratia marcescens NOT DETECTED NOT DETECTED Final   Haemophilus influenzae NOT DETECTED NOT DETECTED Final   Neisseria meningitidis NOT DETECTED NOT DETECTED Final   Pseudomonas aeruginosa NOT DETECTED NOT DETECTED Final   Stenotrophomonas maltophilia NOT DETECTED NOT DETECTED Final   Candida albicans NOT DETECTED NOT DETECTED Final   Candida auris NOT DETECTED NOT DETECTED Final   Candida glabrata NOT DETECTED NOT DETECTED Final   Candida krusei NOT DETECTED NOT DETECTED Final   Candida parapsilosis NOT DETECTED NOT DETECTED Final   Candida tropicalis NOT DETECTED NOT DETECTED Final   Cryptococcus neoformans/gattii NOT DETECTED NOT DETECTED Final    Comment: Performed at [facility], [address] [place], [place] [address]  Culture, blood (routine x 2)     Status: Abnormal   Collection Time: 04/15/20  6:06 PM   Specimen: BLOOD LEFT ARM  Result Value Ref Range Status   Specimen Description BLOOD LEFT ARM  Final   Special Requests   Final    BOTTLES DRAWN AEROBIC ONLY  Blood Culture results may not be optimal due to an inadequate volume of blood received in culture bottles   Culture  Setup Time   Final    GRAM POSITIVE COCCI IN CLUSTERS AEROBIC BOTTLE ONLY CRITICAL VALUE NOTED.  VALUE IS CONSISTENT WITH PREVIOUSLY REPORTED AND CALLED VALUE. Performed at [facility], [place] [address] [place], [place] [address]    Culture STAPHYLOCOCCUS HOMINIS (A)  Final   Report Status 04/18/2020 FINAL  Final   Organism ID, Bacteria STAPHYLOCOCCUS HOMINIS  Final      Susceptibility   Staphylococcus hominis - MIC*    CIPROFLOXACIN >=8 RESISTANT Resistant     ERYTHROMYCIN >=8 RESISTANT Resistant     GENTAMICIN <=0.5 SENSITIVE Sensitive     OXACILLIN >=4 RESISTANT Resistant     TETRACYCLINE >=16 RESISTANT Resistant     VANCOMYCIN <=0.5 SENSITIVE Sensitive     TRIMETH/SULFA 80 RESISTANT Resistant     CLINDAMYCIN RESISTANT Resistant     RIFAMPIN <=0.5 SENSITIVE Sensitive     Inducible Clindamycin POSITIVE Resistant     LINEZOLID Value in next row Sensitive  SENSITIVE2    * STAPHYLOCOCCUS HOMINIS  Culture, blood (routine x 2)     Status: None   Collection Time: 04/15/20  6:45 PM   Specimen: BLOOD  Result Value Ref Range Status   Specimen Description BLOOD SITE NOT SPECIFIED  Final   Special Requests   Final    BOTTLES DRAWN AEROBIC AND ANAEROBIC Blood Culture adequate volume   Culture   Final    NO GROWTH 5 DAYS Performed at [facility], [address] [place], [place] [address]    Report Status 04/20/2020 FINAL  Final  Culture, blood (routine x 2)     Status: None (Preliminary result)   Collection Time: 04/19/20  1:03 PM   Specimen: BLOOD  Result Value Ref Range Status   Specimen Description BLOOD LEFT ANTECUBITAL  Final   Special Requests   Final    BOTTLES DRAWN AEROBIC ONLY Blood Culture results may not be optimal due to an inadequate volume of blood received in culture bottles   Culture   Final    NO GROWTH 3 DAYS Performed at [facility], [place] [address] [place], [place] [address]    Report Status PENDING  Incomplete  Culture, blood (routine x 2)     Status: None (Preliminary result)   Collection Time: 04/19/20  6:26 PM   Specimen: BLOOD  Result Value Ref Range Status   Specimen Description BLOOD LEFT ANTECUBITAL  Final   Special Requests   Final    BOTTLES DRAWN AEROBIC AND ANAEROBIC Blood Culture results may not be optimal due to an excessive volume of blood received in culture bottles   Culture   Final    NO GROWTH 3 DAYS Performed at [facility], [place] [address], [place], [place] [address]    Report Status PENDING  Incomplete    Radiology Reports CT Head Wo Contrast  Result Date: 04/13/2020 CLINICAL DATA:  79 year old female with head trauma. EXAM: CT HEAD WITHOUT CONTRAST CT CERVICAL SPINE WITHOUT CONTRAST TECHNIQUE: Multidetector CT imaging of the head and cervical spine was performed following the standard protocol without intravenous contrast. Multiplanar CT image reconstructions of the cervical spine were also generated. COMPARISON:  CT dated 08/28/2019. FINDINGS: CT HEAD FINDINGS Brain: Mild age-related atrophy and chronic microvascular ischemic changes. There is no acute intracranial hemorrhage. No mass effect or midline shift. No extra-axial fluid collection. Vascular: No hyperdense vessel or unexpected calcification. Skull: Normal. Negative for fracture or focal lesion. Sinuses/Orbits: No acute finding. Other: None CT CERVICAL SPINE FINDINGS Alignment: No acute subluxation. Skull base and vertebrae: No acute fracture. Osteopenia. Soft tissues and spinal canal: No prevertebral fluid or swelling. No visible canal hematoma. Disc levels:  Degenerative changes. No acute findings. Upper chest: Small left upper lobe calcified granuloma. Partially visualized right subclavian central venous line. Other: Bilateral carotid bulb calcified plaques. IMPRESSION: 1. No acute intracranial pathology. Mild  age-related atrophy and chronic microvascular ischemic changes. 2. No acute/traumatic cervical spine pathology. Electronically Signed   By: Anner Crete M.D.   On: 04/13/2020 20:44   CT CHEST WO CONTRAST  Result Date: 04/14/2020 CLINICAL DATA:  Respiratory illness EXAM: CT CHEST WITHOUT CONTRAST TECHNIQUE: Multidetector CT imaging of the chest was performed following the standard protocol without IV contrast. COMPARISON:  Chest x-ray 04/13/2020.  CT 04/11/2020 FINDINGS: Cardiovascular: Aortic atherosclerosis. Scattered coronary artery calcifications. Heart is normal size and aorta is normal caliber. Mediastinum/Nodes: Small scattered non pathologically enlarged mediastinal lymph nodes, stable. No mediastinal,  hilar, or axillary adenopathy. Trachea and esophagus are unremarkable. Thyroid unremarkable. Lungs/Pleura: Peripheral interstitial prominence in thickening compatible with fibrosis, stable. Rounded partially necrotic mass in the posterior right lower lobe measures 3.1 cm and is slightly decreased in size since prior study when this measured 3.8 cm. No effusions. No confluent opacities on the left. Calcified granuloma in the left upper lobe. Upper Abdomen: Imaging into the upper abdomen demonstrates no acute findings. Musculoskeletal: Chest wall soft tissues are unremarkable. Right upper chest wall Port-A-Cath remains in place, unchanged. No acute bony abnormality. Old posterior right 7th rib fracture and upper thoracic compression fractures, unchanged. IMPRESSION: Partially necrotic mass in the posterior right lower lobe is again noted, slightly decreased in size since prior study, 3.1 cm currently compared to 3.8 cm previously. Changes of fibrosis throughout the lungs. Coronary artery disease. Aortic Atherosclerosis (ICD10-I70.0). Electronically Signed   By: Rolm Baptise M.D.   On: 04/14/2020 18:33   CT Cervical Spine Wo Contrast  Result Date: 04/13/2020 CLINICAL DATA:  79 year old female with  head trauma. EXAM: CT HEAD WITHOUT CONTRAST CT CERVICAL SPINE WITHOUT CONTRAST TECHNIQUE: Multidetector CT imaging of the head and cervical spine was performed following the standard protocol without intravenous contrast. Multiplanar CT image reconstructions of the cervical spine were also generated. COMPARISON:  CT dated 08/28/2019. FINDINGS: CT HEAD FINDINGS Brain: Mild age-related atrophy and chronic microvascular ischemic changes. There is no acute intracranial hemorrhage. No mass effect or midline shift. No extra-axial fluid collection. Vascular: No hyperdense vessel or unexpected calcification. Skull: Normal. Negative for fracture or focal lesion. Sinuses/Orbits: No acute finding. Other: None CT CERVICAL SPINE FINDINGS Alignment: No acute subluxation. Skull base and vertebrae: No acute fracture. Osteopenia. Soft tissues and spinal canal: No prevertebral fluid or swelling. No visible canal hematoma. Disc levels:  Degenerative changes. No acute findings. Upper chest: Small left upper lobe calcified granuloma. Partially visualized right subclavian central venous line. Other: Bilateral carotid bulb calcified plaques. IMPRESSION: 1. No acute intracranial pathology. Mild age-related atrophy and chronic microvascular ischemic changes. 2. No acute/traumatic cervical spine pathology. Electronically Signed   By: Anner Crete M.D.   On: 04/13/2020 20:44   MR BRAIN WO CONTRAST  Result Date: 04/17/2020 CLINICAL DATA:  Coronavirus infection. Altered mental status. History of squamous cell carcinoma of the lung. EXAM: MRI HEAD WITHOUT CONTRAST TECHNIQUE: Multiplanar, multiecho pulse sequences of the brain and surrounding structures were obtained without intravenous contrast. COMPARISON:  Head CT 04/13/2020.  MRI 03/04/2020. FINDINGS: Brain: Diffusion imaging does not show any acute or subacute infarction or other cause of restricted diffusion. No abnormality affects the brainstem or cerebellum. Cerebral hemispheres  show age related atrophy with mild chronic small-vessel ischemic changes of the white matter, often seen at this age. No cortical or large vessel territory infarction. No mass lesion, hemorrhage, hydrocephalus or extra-axial collection. Vascular: Major vessels at the base of the brain show flow. Skull and upper cervical spine: Negative Sinuses/Orbits: Sinuses are clear except for a small amount of fluid layering in the left division of the sphenoid sinus, not likely significant. Orbits negative. Previous lens implant on right Other: None IMPRESSION: No acute finding. Age related atrophy. 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 Electronically Signed   By: Nelson Chimes M.D.   On: 04/17/2020 10:50   DG Pelvis Portable  Result Date: 04/13/2020 CLINICAL DATA:  Fall EXAM: PORTABLE PELVIS 1-2 VIEWS COMPARISON:  None. FINDINGS: Prior right hip replacement. Normal AP alignment. Mild degenerative  changes within the left hip. SI joints symmetric and unremarkable. No acute bony abnormality. Specifically, no fracture, subluxation, or dislocation. IMPRESSION: No acute bony abnormality. Electronically Signed   By: Rolm Baptise M.D.   On: 04/13/2020 19:21   DG Chest Port 1 View  Result Date: 04/13/2020 CLINICAL DATA:  Fall, COVID EXAM: PORTABLE CHEST 1 VIEW COMPARISON:  04/11/2020 FINDINGS: Right Port-A-Cath remains in place, unchanged. Heart is normal size. Low lung volumes. Diffuse interstitial prominence throughout the lungs compatible with chronic lung disease/fibrosis. No definite acute confluent airspace opacity. Previously seen rounded mass posteriorly in the right lung better seen on prior CT. No effusions. IMPRESSION: Stable appearance of the chest. Electronically Signed   By: Rolm Baptise M.D.   On: 04/13/2020 19:21   DG Ankle Right Port  Result Date: 04/17/2020 CLINICAL DATA:  79 year old female with soft tissue swelling of the lateral malleolus. No known  injury. EXAM: PORTABLE RIGHT ANKLE - 2 VIEW; RIGHT FOOT - 2 VIEW COMPARISON:  None. FINDINGS: There is no acute fracture or dislocation. The bones are osteopenic. The ankle mortise is intact. The soft tissues are unremarkable. IMPRESSION: No acute fracture or dislocation. Electronically Signed   By: Anner Crete M.D.   On: 04/17/2020 21:16   DG Foot 2 Views Right  Result Date: 04/17/2020 CLINICAL DATA:  79 year old female with soft tissue swelling of the lateral malleolus. No known injury. EXAM: PORTABLE RIGHT ANKLE - 2 VIEW; RIGHT FOOT - 2 VIEW COMPARISON:  None. FINDINGS: There is no acute fracture or dislocation. The bones are osteopenic. The ankle mortise is intact. The soft tissues are unremarkable. IMPRESSION: No acute fracture or dislocation. Electronically Signed   By: Anner Crete M.D.   On: 04/17/2020 21:16   EEG adult  Result Date: 04/16/2020 Lora Havens, MD     04/16/2020  1:37 PM Patient Name: KARINDA CABRIALES MRN: [id number] Epilepsy Attending: Lora Havens Referring Physician/Provider: Dr Oren Binet Date: 04/16/2020 Duration: 25.12 mins Patient history: 79yo F with ams. EEG to evaluate for seizure. Level of alertness: Awake AEDs during EEG study: xanax, pregabalin Technical aspects: This EEG study was done with scalp electrodes positioned according to the 10-20 International system of electrode placement. Electrical activity was acquired at a sampling rate of 500Hz  and reviewed with a high frequency filter of 70Hz  and a low frequency filter of 1Hz . EEG data were recorded continuously and digitally stored. Description: The posterior dominant rhythm consists of 8 Hz activity of moderate voltage (25-35 uV) seen predominantly in posterior head regions, symmetric and reactive to eye opening and eye closing.  EEG showed intermittent generalized 3 to 6 Hz theta-delta slowing. Triphasic waves, generalized were also noted. Hyperventilation and photic stimulation were not  performed.   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 No seizures or epileptiform discharges were seen throughout the recording. [place]   DG HIP UNILAT WITH PELVIS 2-3 VIEWS RIGHT  Result Date: 04/18/2020 CLINICAL DATA:  Acute onset right hip pain. No known injury. History of prior right hip replacement. EXAM: DG HIP (WITH OR WITHOUT PELVIS) 2-3V RIGHT COMPARISON:  Plain film of the pelvis 04/13/2020. PET CT scan 01/25/2020. FINDINGS: The first image which is the AP view is over penetrated. Right hip arthroplasty is in place. No hardware complication. No fracture, dislocation or focal bony lesion. Soft tissues are negative. IMPRESSION: No acute abnormality.  Status post right hip replacement. Electronically Signed   By: Inge Rise M.D.   On: 04/18/2020 15:03

## 2020-04-22 NOTE — Progress Notes (Signed)
Paged [facility] hospitalist, patient in A-fib RVR with rate 150-170s

## 2020-04-23 DIAGNOSIS — U071 COVID-19: Secondary | ICD-10-CM | POA: Diagnosis not present

## 2020-04-23 DIAGNOSIS — W19XXXA Unspecified fall, initial encounter: Secondary | ICD-10-CM | POA: Diagnosis not present

## 2020-04-23 DIAGNOSIS — J441 Chronic obstructive pulmonary disease with (acute) exacerbation: Secondary | ICD-10-CM | POA: Diagnosis not present

## 2020-04-23 DIAGNOSIS — J9611 Chronic respiratory failure with hypoxia: Secondary | ICD-10-CM | POA: Diagnosis not present

## 2020-04-23 LAB — BASIC METABOLIC PANEL
Anion gap: 13 (ref 5–15)
BUN: 31 mg/dL — ABNORMAL HIGH (ref 8–23)
CO2: 25 mmol/L (ref 22–32)
Calcium: 9.6 mg/dL (ref 8.9–10.3)
Chloride: 112 mmol/L — ABNORMAL HIGH (ref 98–111)
Creatinine, Ser: 1.05 mg/dL — ABNORMAL HIGH (ref 0.44–1.00)
GFR, Estimated: 54 mL/min — ABNORMAL LOW (ref >60)
Glucose, Bld: 112 mg/dL — ABNORMAL HIGH (ref 70–99)
Potassium: 3.1 mmol/L — ABNORMAL LOW (ref 3.5–5.1)
Sodium: 150 mmol/L — ABNORMAL HIGH (ref 135–145)

## 2020-04-23 LAB — CBC
HCT: 42.3 % (ref 36.0–46.0)
Hemoglobin: 13 g/dL (ref 12.0–15.0)
MCH: 25.8 pg — ABNORMAL LOW (ref 26.0–34.0)
MCHC: 30.7 g/dL (ref 30.0–36.0)
MCV: 84.1 fL (ref 80.0–100.0)
Platelets: 328 10*3/uL (ref 150–400)
RBC: 5.03 MIL/uL (ref 3.87–5.11)
RDW: 19.9 % — ABNORMAL HIGH (ref 11.5–15.5)
WBC: 9.6 10*3/uL (ref 4.0–10.5)
nRBC: 0.5 % — ABNORMAL HIGH (ref 0.0–0.2)

## 2020-04-23 LAB — GLUCOSE, CAPILLARY: Glucose-Capillary: 124 mg/dL — ABNORMAL HIGH (ref 70–99)

## 2020-04-23 MED ORDER — ACETAMINOPHEN 325 MG PO TABS: 650.0000 mg | ORAL_TABLET | Freq: Four times a day (QID) | ORAL | Status: DC | PRN

## 2020-04-23 MED ORDER — HALOPERIDOL LACTATE 2 MG/ML PO CONC
0.5000 mg | ORAL | Status: DC | PRN
  Filled 2020-04-23: qty 0.3

## 2020-04-23 MED ORDER — HALOPERIDOL 0.5 MG PO TABS
0.5000 mg | ORAL_TABLET | ORAL | Status: DC | PRN
  Filled 2020-04-23: qty 1

## 2020-04-23 MED ORDER — LORAZEPAM 2 MG/ML IJ SOLN: 1.0000 mg | INTRAMUSCULAR | Status: DC | PRN

## 2020-04-23 MED ORDER — HALOPERIDOL LACTATE 5 MG/ML IJ SOLN: 0.5000 mg | INTRAMUSCULAR | Status: DC | PRN

## 2020-04-23 MED ORDER — HALOPERIDOL 1 MG PO TABS
2.0000 mg | ORAL_TABLET | ORAL | Status: DC | PRN
  Filled 2020-04-23: qty 2

## 2020-04-23 MED ORDER — LORAZEPAM 1 MG PO TABS: 1.0000 mg | ORAL_TABLET | ORAL | Status: DC | PRN

## 2020-04-23 MED ORDER — SODIUM CHLORIDE 0.9% FLUSH
3.0000 mL | Freq: Two times a day (BID) | INTRAVENOUS | Status: DC
  Administered 2020-04-24 – 2020-04-26 (×2): 3 mL via INTRAVENOUS

## 2020-04-23 MED ORDER — ACETAMINOPHEN 650 MG RE SUPP: 650.0000 mg | Freq: Four times a day (QID) | RECTAL | Status: DC | PRN

## 2020-04-23 MED ORDER — SODIUM CHLORIDE 0.9% FLUSH: 3.0000 mL | INTRAVENOUS | Status: DC | PRN

## 2020-04-23 MED ORDER — HYDROMORPHONE HCL 1 MG/ML IJ SOLN
1.0000 mg | INTRAMUSCULAR | Status: DC | PRN
  Administered 2020-04-23 (×2): 1 mg via INTRAVENOUS
  Filled 2020-04-23 (×2): qty 1

## 2020-04-23 MED ORDER — CLONAZEPAM 0.1 MG/ML ORAL SUSPENSION
1.0000 mg | Freq: Three times a day (TID) | ORAL | Status: DC
  Filled 2020-04-23 (×2): qty 10

## 2020-04-23 MED ORDER — HALOPERIDOL LACTATE 2 MG/ML PO CONC
2.0000 mg | ORAL | Status: DC | PRN
  Filled 2020-04-23: qty 1

## 2020-04-23 MED ORDER — SODIUM CHLORIDE 0.9 % IV SOLN: 250.0000 mL | INTRAVENOUS | Status: DC | PRN

## 2020-04-23 MED ORDER — LORAZEPAM 2 MG/ML PO CONC: 1.0000 mg | ORAL | Status: DC | PRN

## 2020-04-23 MED ORDER — CLONAZEPAM 0.5 MG PO TBDP
1.0000 mg | ORAL_TABLET | Freq: Three times a day (TID) | ORAL | Status: DC
  Administered 2020-04-24 – 2020-04-26 (×6): 1 mg via ORAL
  Filled 2020-04-23 (×8): qty 2

## 2020-04-23 MED ORDER — SODIUM CHLORIDE 0.9% FLUSH: 10.0000 mL | INTRAVENOUS | Status: DC | PRN

## 2020-04-23 MED ORDER — ONDANSETRON HCL 4 MG/2ML IJ SOLN: 4.0000 mg | Freq: Four times a day (QID) | INTRAMUSCULAR | Status: DC | PRN

## 2020-04-23 MED ORDER — HALOPERIDOL LACTATE 5 MG/ML IJ SOLN
2.0000 mg | INTRAMUSCULAR | Status: DC | PRN
  Administered 2020-04-23: 2 mg via INTRAVENOUS
  Filled 2020-04-23: qty 1

## 2020-04-23 MED ORDER — ONDANSETRON 4 MG PO TBDP: 4.0000 mg | ORAL_TABLET | Freq: Four times a day (QID) | ORAL | Status: DC | PRN

## 2020-04-23 MED ORDER — ATROPINE SULFATE 1 % OP SOLN
4.0000 [drp] | OPHTHALMIC | Status: DC | PRN
  Filled 2020-04-23: qty 2

## 2020-04-23 MED ORDER — MORPHINE SULFATE (PF) 2 MG/ML IV SOLN
1.0000 mg | INTRAVENOUS | Status: DC | PRN
  Administered 2020-04-23: 1 mg via INTRAVENOUS
  Filled 2020-04-23: qty 1

## 2020-04-23 MED ORDER — CLONAZEPAM 0.1 MG/ML ORAL SUSPENSION
1.0000 mg | Freq: Three times a day (TID) | ORAL | Status: DC
  Administered 2020-04-23: 1 mg via ORAL
  Filled 2020-04-23 (×2): qty 10

## 2020-04-23 MED ORDER — SODIUM CHLORIDE 0.9% FLUSH
10.0000 mL | Freq: Two times a day (BID) | INTRAVENOUS | Status: DC
  Administered 2020-04-23: 10 mL

## 2020-04-23 NOTE — Progress Notes (Signed)
PROGRESS NOTE                                                                                                                                                                                                             Patient Demographics:    [name], is a 79 y.o. female, DOB - [birth year], OIN:[id number]  Outpatient Primary MD for the patient is [name], FNP   Admit date - 04/13/2020   LOS - 9  Chief Complaint  Patient presents with  . Fall       Brief Narrative: Patient is a 79 y.o. 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 Hospital course complicated by lingering/waxing/waning encephalopathy-and severe failure to thrive syndrome with very poor oral intake.  Palliative care consulted-after extensive discussion with patient's daughter-she has been transitioned to full comfort measures on 11/28.  COVID-19 vaccinated status: Unvaccinated  Significant Events: 11/18>> Admit to [facility] for weakness/confusion/COVID-19 pneumonia  Significant studies: 11/18>> chest x-ray: Diffuse interstitial prominence throughout both lungs compatible with chronic lung disease/fibrosis-no definite airspace opacity 11/18>> pelvic x-ray: No acute bony abnormality. 11/18>> CT head/C-spine: No acute abnormality in head/no fractures in C-spine 11/19>> CT chest: Partially necrotic mass in the right lower lobe-fibrosis throughout both lungs.  CAD. 11/21>> EEG - This study is suggestive of mild diffuse encephalopathy, nonspecific etiology but likely related to toxic-metabolic etiology, anoxic/hypoxic brain injury. No seizures or epileptiform discharges were seen throughout the recording. 11/22 >> MRI brain: No acute  findings.  COVID-19 medications: Steroids: 11/18>>11/25 Remdesivir: 11/18>>11/22 Monoclonal antibody: 11/16 x 1  Antibiotics: Vancomycin: 11/21>> 11/22 Rocephin: 11/22>> 11/24 Zyvox: 11/23>>  Microbiology data: 11/24>> blood culture: No growth. 11/20>> blood cultures: Staph hominis (1/2) 11/18 >>blood culture: Staph hominis. 11/18>> urine culture: <10,007 colonies/mL-insignificant growth  Procedures: None  Consults: None  DVT prophylaxis: Not needed as patient transition to full comfort measures.    Subjective:   Gurgling-accumulating secretions this morning.  Developed A. fib with RVR.  No oral intake for the past several days-spitting out medications.  Sodium levels have started to increase.  She is nonverbal but tracking my movement and moving all 4 extremities.  Appears more  confused than the past few days.   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  Suspect has some significant amount of radiation-induced fibrosis and a CT chest.  Has completed a course of Remdesivir and steroids.  Fever: afebrile O2 requirements:  SpO2: 92 % O2 Flow Rate (L/min): 2 L/min     Recent Labs  Lab 04/17/20 0830 04/17/20 0830 04/18/20 0149 04/18/20 0149 04/19/20 0048 04/19/20 0401 04/20/20 0035 04/21/20 0101 04/22/20 0412 04/23/20 0239  WBC 9.8   < > 11.7*   < >  --  11.7* 10.7* 10.9* 10.0 9.6  HGB 13.5   < > 12.3   < >  --  12.3 13.5 13.2 12.7 13.0  HCT 44.1   < > 39.3   < >  --  39.2 43.4 42.9 40.0 42.3  PLT 444*   < > 447*   < >  --  419* 334 472* 394 328  CRP 3.1*  --  1.5*  --  0.7 0.7 0.7  --   --   --   BNP 80.4  --  67.4  --   --  58.4 106.6*  --   --   --   DDIMER 2.87*  --  1.95*  --  2.20* 2.12* 2.40*  --   --   --   PROCALCITON <0.10  --  <0.10  --   --  <0.10  --   --   --   --   AST 45*   < > 31  --   --  29 34 31 50*  --    ALT 37   < > 32  --   --  28 29 26 28   --   ALKPHOS 119   < > 109  --   --  101 92 100 82  --   BILITOT 1.1   < > 1.0  --   --  1.0 0.8 0.9 1.5*  --   ALBUMIN 2.9*   < > 2.7*  --   --  2.6* 3.0* 3.1* 2.9*  --    < > = values in this interval not displayed.    COPD exacerbation: Resolved-moving air well-completed steroids 11/25  Staph hominis bacteremia: Culture results as above-discussed with Dr. Carolyne Fiscal on-call on 11/25-chart reviewed over the phone-recommends total of 7 days of Zyvox from 11/23.  Unfortunately-patient refusing Zyvox-and is spitting out medications-since transition to comfort measures-stopped all antimicrobial therapy.  Acute metabolic encephalopathy: Most likely COVID-19 inflammation related encephalopathy, CT, MRI and EEG all nonacute.  No focal deficits.  Continues to have waxing and waning encephalopathy-now with severe failure to thrive syndrome.  After discussion with family on 11/28-transitioned to comfort measures.    Fall: Secondary debility/deconditioning at baseline-superimposed weakness due to COVID-19 infection.  Appreciate PT/OT eval-recommendations of SNF.  Anxiety/depression: All psychotropic medications were held to see if this improves her mentation-however no significant improvement-these medications were resumed on 11/27-however patient continues to refuse any sort of oral intake.  Hypernatremia: Secondary to poor oral intake-severe failure to thrive syndrome-underlying malignancy and COVID-19 infection.  After discussion with family-transition to full comfort measures.  A. fib with RVR: Developed overnight on 11/27-started on Cardizem infusion.  Since transition to comfort measures-no longer needs rate control medications and on telemetry monitoring.     Chronic pain syndrome-opiate  dependent: Given encephalopathy-all narcotics were held-she is now being started on IV morphine prn for comfort.  Squamous cell carcinoma of the lung-getting radiation  treatments-resume outpatient follow-up with radiation oncology/medical oncology-could have some amount of fibrosis/inflammation in the lungs due to radiation.  On steroids.  Per daughter-patient on radiation treatment as was deemed to be a poor candidate for chemotherapy due to overall medical comorbidities/functional status.  Normocytic anemia:  History of VTE: Initially on oral anticoagulation-due to her not taking oral medications-this was changed to SQ Lovenox.  Since patient transitioned to comfort measures-all anticoagulation discontinued.  HTN: BP high, unable to take oral medications consistently-Nitropaste was added-since transitioned to comfort measures-no antihypertensives required.    HLD  GERD  Goals of care: DNR in place-admitted with COVID-19 infection/possible pneumonia-in a background of failure to thrive syndrome/squamous cell carcinoma of the lung.  Although her respiratory status stabilized-patient continued to deteriorate-has severe failure to thrive syndrome/hardly any oral intake (was having these issues even prior to this hospitalization per patient's daughter) palliative care was consulted.  Given continued decline-with worsening hypernatremia/possible risk of aspiration due to accumulation of secretions today-ongoing A. fib with RVR-persistent encephalopathy-after extensive discussion with patient's daughter on 11/28-patient has been transitioned to full comfort measures.  We will see how she does overnight-if she is relatively stable-daughter is now agreeable to residential hospice if needed.  GI prophylaxis: PPI   Condition - Stable  Family Communication  : Daughter [name] [phone]) over the phone on 11/28  Code Status :  Full Code  Diet :  Diet Order            Diet regular Room service appropriate? Yes; Fluid consistency: Thin  Diet effective now                  Disposition Plan  :  Status is: Inpatient  The patient will require care spanning  > 2 midnights and should be moved to inpatient because: Inpatient level of care appropriate due to severity of illness  Dispo: The patient is from: Home              Anticipated d/c is to: TBD              Anticipated d/c date is: 2 days              Patient currently is not medically stable to d/c.   Barriers to discharge: Severe failure to thrive syndrome-worsening hypernatremia-poor oral intake-starting comfort measures.  Antimicorbials  :    Anti-infectives (From admission, onward)   Start     Dose/Rate Route Frequency Ordered Stop   04/18/20 2200  linezolid (ZYVOX) tablet 600 mg  Status:  Discontinued        600 mg Oral Every 12 hours 04/18/20 1624 04/22/20 1525   04/17/20 1130  cefTRIAXone (ROCEPHIN) 1 g in sodium chloride 0.9 % 100 mL IVPB        1 g 200 mL/hr over 30 Minutes Intravenous Every 24 hours 04/17/20 1039 04/19/20 1444   04/16/20 2000  vancomycin (VANCOREADY) IVPB 750 mg/150 mL  Status:  Discontinued       "Followed by" Linked Group Details   750 mg 150 mL/hr over 60 Minutes Intravenous Every 24 hours 04/15/20 1647 04/18/20 1624   04/16/20 1700  vancomycin (VANCOREADY) IVPB 750 mg/150 mL  Status:  Discontinued       "Followed by" Linked Group Details   750 mg 150 mL/hr over 60 Minutes Intravenous Every 24  hours 04/15/20 1403 04/15/20 1647   04/15/20 2000  vancomycin (VANCOCIN) IVPB 1000 mg/200 mL premix       "Followed by" Linked Group Details   1,000 mg 200 mL/hr over 60 Minutes Intravenous  Once 04/15/20 1647 04/16/20 0223   04/15/20 1700  vancomycin (VANCOCIN) IVPB 1000 mg/200 mL premix  Status:  Discontinued       "Followed by" Linked Group Details   1,000 mg 200 mL/hr over 60 Minutes Intravenous  Once 04/15/20 1403 04/15/20 1647   04/14/20 1000  remdesivir 100 mg in sodium chloride 0.9 % 100 mL IVPB       "Followed by" Linked Group Details   100 mg 200 mL/hr over 30 Minutes Intravenous Daily 04/13/20 2227 04/17/20 1555   04/13/20 2330  remdesivir 200 mg  in sodium chloride 0.9% 250 mL IVPB       "Followed by" Linked Group Details   200 mg 580 mL/hr over 30 Minutes Intravenous Once 04/13/20 2227 04/14/20 0254      Inpatient Medications  Scheduled Meds: . ALPRAZolam  0.5 mg Oral QHS  . vitamin C  500 mg Oral Daily  . Chlorhexidine Gluconate Cloth  6 each Topical Daily  . DULoxetine  30 mg Oral BH-q7a  . enoxaparin (LOVENOX) injection  70 mg Subcutaneous Q12H  . feeding supplement  237 mL Oral BID BM  . metoprolol tartrate  50 mg Oral BID  . nitroGLYCERIN  1 inch Topical Q6H  . pantoprazole  40 mg Oral BID  . pravastatin  10 mg Oral q1800  . pregabalin  100 mg Oral BID  . sodium chloride flush  10-40 mL Intracatheter Q12H  . traZODone  50 mg Oral QHS  . zinc sulfate  220 mg Oral Daily   Continuous Infusions: . diltiazem (CARDIZEM) infusion 15 mg/hr (04/23/20 0753)   PRN Meds:.acetaminophen, albuterol, haloperidol lactate, hydrALAZINE, Ipratropium-Albuterol, morphine injection, [DISCONTINUED] ondansetron **OR** ondansetron (ZOFRAN) IV, polyethylene glycol, sodium chloride flush   Time Spent in minutes  25  See all Orders from today for further details   Oren Binet M.D on 04/23/2020 at 10:36 AM  To page go to [email or web address] - use universal password  [facility] -  Office  [phone]    Objective:   Vitals:   04/23/20 0600 04/23/20 0603 04/23/20 0753 04/23/20 0809  BP: 129/70 129/70  132/74  Pulse: (!) 112 87 (!) 105 (!) 102  Resp:   (!) 23 20  Temp:    97.9 F (36.6 C)  TempSrc:    Oral  SpO2:  (!) 85%  92%  Weight:      Height:        Wt Readings from Last 3 Encounters:  04/23/20 66.5 kg     Intake/Output Summary (Last 24 hours) at 04/23/2020 1036 Last data filed at 04/23/2020 0600 Gross per 24 hour  Intake 496.3 ml  Output --  Net 496.3 ml     Physical Exam Gen Exam: Not in any distress HEENT:atraumatic, normocephalic Chest: B/L clear to auscultation anteriorly CVS:S1S2  regular Abdomen:soft non tender, non distended Extremities:no edema Neurology: Non focal Skin: no rash    Data Review:    CBC Recent Labs  Lab 04/18/20 0149 04/18/20 0149 04/19/20 0401 04/20/20 0035 04/21/20 0101 04/22/20 0412 04/23/20 0239  WBC 11.7*   < > 11.7* 10.7* 10.9* 10.0 9.6  HGB 12.3   < > 12.3 13.5 13.2 12.7 13.0  HCT 39.3   < > 39.2 43.4 42.9 40.0 42.3  PLT 447*   < > 419* 334 472* 394 328  MCV 82.6   < > 82.2 83.9 83.0 82.1 84.1  MCH 25.8*   < > 25.8* 26.1 25.5* 26.1 25.8*  MCHC 31.3   < > 31.4 31.1 30.8 31.8 30.7  RDW 18.6*   < > 18.6* 18.9* 18.7* 19.2* 19.9*  LYMPHSABS 0.8  --  1.0 0.6* 0.9 0.9  --   MONOABS 1.0  --  1.3* 0.3 1.3* 1.1*  --   EOSABS 0.0  --  0.0 0.0 0.0 0.0  --   BASOSABS 0.0  --  0.0 0.0 0.0 0.0  --    < > = values in this interval not displayed.    Chemistries  Recent Labs  Lab 04/17/20 0830 04/17/20 0830 04/18/20 0149 04/18/20 0149 04/19/20 0401 04/20/20 0035 04/21/20 0101 04/22/20 0412 04/23/20 0239  NA 140   < > 138   < > 140 140 144 145 150*  K 3.5   < > 3.2*   < > 3.4* 4.1 4.0 4.1 3.1*  CL 105   < > 102   < > 105 106 105 112* 112*  CO2 21*   < > 20*   < > 21* 19* 23 20* 25  GLUCOSE 99   < > 105*   < > 112* 89 128* 125* 112*  BUN 21   < > 27*   < > 28* 25* 33* 33* 31*  CREATININE 1.02*   < > 0.91   < > 0.90 0.74 1.00 0.95 1.05*  CALCIUM 9.9   < > 9.4   < > 9.4 9.6 10.4* 9.5 9.6  MG 2.0  --  2.0  --  2.1 2.1  --   --   --   AST 45*   < > 31  --  29 34 31 50*  --   ALT 37   < > 32  --  28 29 26 28   --   ALKPHOS 119   < > 109  --  101 92 100 82  --   BILITOT 1.1   < > 1.0  --  1.0 0.8 0.9 1.5*  --    < > = values in this interval not displayed.   ------------------------------------------------------------------------------------------------------------------ No results for input(s): CHOL, HDL, LDLCALC, TRIG, CHOLHDL, LDLDIRECT in the last 72 hours.  No results found for:  HGBA1C ------------------------------------------------------------------------------------------------------------------ No results for input(s): TSH, T4TOTAL, T3FREE, THYROIDAB in the last 72 hours.  Invalid input(s): FREET3 ------------------------------------------------------------------------------------------------------------------ No results for input(s): VITAMINB12, FOLATE, FERRITIN, TIBC, IRON, RETICCTPCT in the last 72 hours.  Coagulation profile No results for input(s): INR, PROTIME in the last 168 hours.  No results for input(s): DDIMER in the last 72 hours.  Cardiac Enzymes No results for input(s): CKMB, TROPONINI, MYOGLOBIN in the last 168 hours.  Invalid input(s): CK ------------------------------------------------------------------------------------------------------------------    Component Value Date/Time   BNP 106.6 (H) 04/20/2020 0035    Micro Results Recent Results (from the past 240 hour(s))  Blood Culture (routine x 2)     Status: Abnormal   Collection Time: 04/13/20  7:00 PM   Specimen: BLOOD  Result Value Ref Range Status   Specimen Description BLOOD SITE NOT SPECIFIED  Final   Special Requests   Final    BOTTLES DRAWN AEROBIC ONLY Blood Culture results may not be optimal due to an inadequate volume of blood received in culture bottles   Culture  Setup Time   Final  GRAM POSITIVE COCCI AEROBIC BOTTLE ONLY Organism ID to follow CRITICAL RESULT CALLED TO, READ BACK BY AND VERIFIED WITH: PHARMD M [place] 04/14/20 AT 1853 SK    Culture (A)  Final    STAPHYLOCOCCUS HOMINIS THE SIGNIFICANCE OF ISOLATING THIS ORGANISM FROM A SINGLE SET OF BLOOD CULTURES WHEN MULTIPLE SETS ARE DRAWN IS UNCERTAIN. PLEASE NOTIFY THE MICROBIOLOGY DEPARTMENT WITHIN ONE WEEK IF SPECIATION AND SENSITIVITIES ARE REQUIRED. Performed at [facility], [place] [address], [place], [place] [address]    Report Status 04/15/2020 FINAL  Final  Blood Culture ID Panel (Reflexed)      Status: Abnormal   Collection Time: 04/13/20  7:00 PM  Result Value Ref Range Status   Enterococcus faecalis NOT DETECTED NOT DETECTED Final   Enterococcus Faecium NOT DETECTED NOT DETECTED Final   Listeria monocytogenes NOT DETECTED NOT DETECTED Final   Staphylococcus species DETECTED (A) NOT DETECTED Final    Comment: CRITICAL RESULT CALLED TO, READ BACK BY AND VERIFIED WITH: PHARMD M [place] 04/14/20 AT 1853 SK    Staphylococcus aureus (BCID) NOT DETECTED NOT DETECTED Final   Staphylococcus epidermidis NOT DETECTED NOT DETECTED Final   Staphylococcus lugdunensis NOT DETECTED NOT DETECTED Final   Streptococcus species NOT DETECTED NOT DETECTED Final   Streptococcus agalactiae NOT DETECTED NOT DETECTED Final   Streptococcus pneumoniae NOT DETECTED NOT DETECTED Final   Streptococcus pyogenes NOT DETECTED NOT DETECTED Final   A.calcoaceticus-baumannii NOT DETECTED NOT DETECTED Final   Bacteroides fragilis NOT DETECTED NOT DETECTED Final   Enterobacterales NOT DETECTED NOT DETECTED Final   Enterobacter cloacae complex NOT DETECTED NOT DETECTED Final   Escherichia coli NOT DETECTED NOT DETECTED Final   Klebsiella aerogenes NOT DETECTED NOT DETECTED Final   Klebsiella oxytoca NOT DETECTED NOT DETECTED Final   Klebsiella pneumoniae NOT DETECTED NOT DETECTED Final   Proteus species NOT DETECTED NOT DETECTED Final   Salmonella species NOT DETECTED NOT DETECTED Final   Serratia marcescens NOT DETECTED NOT DETECTED Final   Haemophilus influenzae NOT DETECTED NOT DETECTED Final   Neisseria meningitidis NOT DETECTED NOT DETECTED Final   Pseudomonas aeruginosa NOT DETECTED NOT DETECTED Final   Stenotrophomonas maltophilia NOT DETECTED NOT DETECTED Final   Candida albicans NOT DETECTED NOT DETECTED Final   Candida auris NOT DETECTED NOT DETECTED Final   Candida glabrata NOT DETECTED NOT DETECTED Final   Candida krusei NOT DETECTED NOT DETECTED Final   Candida parapsilosis NOT DETECTED NOT  DETECTED Final   Candida tropicalis NOT DETECTED NOT DETECTED Final   Cryptococcus neoformans/gattii NOT DETECTED NOT DETECTED Final    Comment: Performed at [facility], [address], [place], [place] [address]  Culture, blood (routine x 2)     Status: Abnormal   Collection Time: 04/15/20  6:06 PM   Specimen: BLOOD LEFT ARM  Result Value Ref Range Status   Specimen Description BLOOD LEFT ARM  Final   Special Requests   Final    BOTTLES DRAWN AEROBIC ONLY Blood Culture results may not be optimal due to an inadequate volume of blood received in culture bottles   Culture  Setup Time   Final    GRAM POSITIVE COCCI IN CLUSTERS AEROBIC BOTTLE ONLY CRITICAL VALUE NOTED.  VALUE IS CONSISTENT WITH PREVIOUSLY REPORTED AND CALLED VALUE. Performed at [facility], [place] [address], [place], [place] [address]    Culture STAPHYLOCOCCUS HOMINIS (A)  Final   Report Status 04/18/2020 FINAL  Final   Organism ID, Bacteria  STAPHYLOCOCCUS HOMINIS  Final      Susceptibility   Staphylococcus hominis - MIC*    CIPROFLOXACIN >=8 RESISTANT Resistant     ERYTHROMYCIN >=8 RESISTANT Resistant     GENTAMICIN <=0.5 SENSITIVE Sensitive     OXACILLIN >=4 RESISTANT Resistant     TETRACYCLINE >=16 RESISTANT Resistant     VANCOMYCIN <=0.5 SENSITIVE Sensitive     TRIMETH/SULFA 80 RESISTANT Resistant     CLINDAMYCIN RESISTANT Resistant     RIFAMPIN <=0.5 SENSITIVE Sensitive     Inducible Clindamycin POSITIVE Resistant     LINEZOLID Value in next row Sensitive      SENSITIVE2    * STAPHYLOCOCCUS HOMINIS  Culture, blood (routine x 2)     Status: None   Collection Time: 04/15/20  6:45 PM   Specimen: BLOOD  Result Value Ref Range Status   Specimen Description BLOOD SITE NOT SPECIFIED  Final   Special Requests   Final    BOTTLES DRAWN AEROBIC AND ANAEROBIC Blood Culture adequate volume   Culture   Final    NO GROWTH 5 DAYS Performed at [facility], [address], [place], [place] [address]     Report Status 04/20/2020 FINAL  Final  Culture, blood (routine x 2)     Status: None (Preliminary result)   Collection Time: 04/19/20  1:03 PM   Specimen: BLOOD  Result Value Ref Range Status   Specimen Description BLOOD LEFT ANTECUBITAL  Final   Special Requests   Final    BOTTLES DRAWN AEROBIC ONLY Blood Culture results may not be optimal due to an inadequate volume of blood received in culture bottles   Culture   Final    NO GROWTH 4 DAYS Performed at [facility], [place] [address], [place], [place] [address]    Report Status PENDING  Incomplete  Culture, blood (routine x 2)     Status: None (Preliminary result)   Collection Time: 04/19/20  6:26 PM   Specimen: BLOOD  Result Value Ref Range Status   Specimen Description BLOOD LEFT ANTECUBITAL  Final   Special Requests   Final    BOTTLES DRAWN AEROBIC AND ANAEROBIC Blood Culture results may not be optimal due to an excessive volume of blood received in culture bottles   Culture   Final    NO GROWTH 4 DAYS Performed at [facility], [place] [address], [place], [place] [address]    Report Status PENDING  Incomplete    Radiology Reports CT Head Wo Contrast  Result Date: 04/13/2020 CLINICAL DATA:  79 year old female with head trauma. EXAM: CT HEAD WITHOUT CONTRAST CT CERVICAL SPINE WITHOUT CONTRAST TECHNIQUE: Multidetector CT imaging of the head and cervical spine was performed following the standard protocol without intravenous contrast. Multiplanar CT image reconstructions of the cervical spine were also generated. COMPARISON:  CT dated 08/28/2019. FINDINGS: CT HEAD FINDINGS Brain: Mild age-related atrophy and chronic microvascular ischemic changes. There is no acute intracranial hemorrhage. No mass effect or midline shift. No extra-axial fluid collection. Vascular: No hyperdense vessel or unexpected calcification. Skull: Normal. Negative for fracture or focal lesion. Sinuses/Orbits: No acute finding. Other: None CT CERVICAL  SPINE FINDINGS Alignment: No acute subluxation. Skull base and vertebrae: No acute fracture. Osteopenia. Soft tissues and spinal canal: No prevertebral fluid or swelling. No visible canal hematoma. Disc levels:  Degenerative changes. No acute findings. Upper chest: Small left upper lobe calcified granuloma. Partially visualized right subclavian central venous line. Other: Bilateral carotid bulb  calcified plaques. IMPRESSION: 1. No acute intracranial pathology. Mild age-related atrophy and chronic microvascular ischemic changes. 2. No acute/traumatic cervical spine pathology. Electronically Signed   By: Anner Crete M.D.   On: 04/13/2020 20:44   CT CHEST WO CONTRAST  Result Date: 04/14/2020 CLINICAL DATA:  Respiratory illness EXAM: CT CHEST WITHOUT CONTRAST TECHNIQUE: Multidetector CT imaging of the chest was performed following the standard protocol without IV contrast. COMPARISON:  Chest x-ray 04/13/2020.  CT 04/11/2020 FINDINGS: Cardiovascular: Aortic atherosclerosis. Scattered coronary artery calcifications. Heart is normal size and aorta is normal caliber. Mediastinum/Nodes: Small scattered non pathologically enlarged mediastinal lymph nodes, stable. No mediastinal, hilar, or axillary adenopathy. Trachea and esophagus are unremarkable. Thyroid unremarkable. Lungs/Pleura: Peripheral interstitial prominence in thickening compatible with fibrosis, stable. Rounded partially necrotic mass in the posterior right lower lobe measures 3.1 cm and is slightly decreased in size since prior study when this measured 3.8 cm. No effusions. No confluent opacities on the left. Calcified granuloma in the left upper lobe. Upper Abdomen: Imaging into the upper abdomen demonstrates no acute findings. Musculoskeletal: Chest wall soft tissues are unremarkable. Right upper chest wall Port-A-Cath remains in place, unchanged. No acute bony abnormality. Old posterior right 7th rib fracture and upper thoracic compression fractures,  unchanged. IMPRESSION: Partially necrotic mass in the posterior right lower lobe is again noted, slightly decreased in size since prior study, 3.1 cm currently compared to 3.8 cm previously. Changes of fibrosis throughout the lungs. Coronary artery disease. Aortic Atherosclerosis (ICD10-I70.0). Electronically Signed   By: Rolm Baptise M.D.   On: 04/14/2020 18:33   CT Cervical Spine Wo Contrast  Result Date: 04/13/2020 CLINICAL DATA:  79 year old female with head trauma. EXAM: CT HEAD WITHOUT CONTRAST CT CERVICAL SPINE WITHOUT CONTRAST TECHNIQUE: Multidetector CT imaging of the head and cervical spine was performed following the standard protocol without intravenous contrast. Multiplanar CT image reconstructions of the cervical spine were also generated. COMPARISON:  CT dated 08/28/2019. FINDINGS: CT HEAD FINDINGS Brain: Mild age-related atrophy and chronic microvascular ischemic changes. There is no acute intracranial hemorrhage. No mass effect or midline shift. No extra-axial fluid collection. Vascular: No hyperdense vessel or unexpected calcification. Skull: Normal. Negative for fracture or focal lesion. Sinuses/Orbits: No acute finding. Other: None CT CERVICAL SPINE FINDINGS Alignment: No acute subluxation. Skull base and vertebrae: No acute fracture. Osteopenia. Soft tissues and spinal canal: No prevertebral fluid or swelling. No visible canal hematoma. Disc levels:  Degenerative changes. No acute findings. Upper chest: Small left upper lobe calcified granuloma. Partially visualized right subclavian central venous line. Other: Bilateral carotid bulb calcified plaques. IMPRESSION: 1. No acute intracranial pathology. Mild age-related atrophy and chronic microvascular ischemic changes. 2. No acute/traumatic cervical spine pathology. Electronically Signed   By: Anner Crete M.D.   On: 04/13/2020 20:44   MR BRAIN WO CONTRAST  Result Date: 04/17/2020 CLINICAL DATA:  Coronavirus infection. Altered mental  status. History of squamous cell carcinoma of the lung. EXAM: MRI HEAD WITHOUT CONTRAST TECHNIQUE: Multiplanar, multiecho pulse sequences of the brain and surrounding structures were obtained without intravenous contrast. COMPARISON:  Head CT 04/13/2020.  MRI 03/04/2020. FINDINGS: Brain: Diffusion imaging does not show any acute or subacute infarction or other cause of restricted diffusion. No abnormality affects the brainstem or cerebellum. Cerebral hemispheres show age related atrophy with mild chronic small-vessel ischemic changes of the white matter, often seen at this age. No cortical or large vessel territory infarction. No mass lesion, hemorrhage, hydrocephalus or extra-axial collection. Vascular: Major vessels at the base  of the brain show flow. Skull and upper cervical spine: Negative Sinuses/Orbits: Sinuses are clear except for a small amount of fluid layering in the left division of the sphenoid sinus, not likely significant. Orbits negative. Previous lens implant on right Other: None IMPRESSION: No acute finding. Age related atrophy. 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 Electronically Signed   By: Nelson Chimes M.D.   On: 04/17/2020 10:50   DG Pelvis Portable  Result Date: 04/13/2020 CLINICAL DATA:  Fall EXAM: PORTABLE PELVIS 1-2 VIEWS COMPARISON:  None. FINDINGS: Prior right hip replacement. Normal AP alignment. Mild degenerative changes within the left hip. SI joints symmetric and unremarkable. No acute bony abnormality. Specifically, no fracture, subluxation, or dislocation. IMPRESSION: No acute bony abnormality. Electronically Signed   By: Rolm Baptise M.D.   On: 04/13/2020 19:21   DG Chest Port 1 View  Result Date: 04/13/2020 CLINICAL DATA:  Fall, COVID EXAM: PORTABLE CHEST 1 VIEW COMPARISON:  04/11/2020 FINDINGS: Right Port-A-Cath remains in place, unchanged. Heart is normal size. Low lung volumes. Diffuse interstitial prominence  throughout the lungs compatible with chronic lung disease/fibrosis. No definite acute confluent airspace opacity. Previously seen rounded mass posteriorly in the right lung better seen on prior CT. No effusions. IMPRESSION: Stable appearance of the chest. Electronically Signed   By: Rolm Baptise M.D.   On: 04/13/2020 19:21   DG Ankle Right Port  Result Date: 04/17/2020 CLINICAL DATA:  79 year old female with soft tissue swelling of the lateral malleolus. No known injury. EXAM: PORTABLE RIGHT ANKLE - 2 VIEW; RIGHT FOOT - 2 VIEW COMPARISON:  None. FINDINGS: There is no acute fracture or dislocation. The bones are osteopenic. The ankle mortise is intact. The soft tissues are unremarkable. IMPRESSION: No acute fracture or dislocation. Electronically Signed   By: Anner Crete M.D.   On: 04/17/2020 21:16   DG Foot 2 Views Right  Result Date: 04/17/2020 CLINICAL DATA:  79 year old female with soft tissue swelling of the lateral malleolus. No known injury. EXAM: PORTABLE RIGHT ANKLE - 2 VIEW; RIGHT FOOT - 2 VIEW COMPARISON:  None. FINDINGS: There is no acute fracture or dislocation. The bones are osteopenic. The ankle mortise is intact. The soft tissues are unremarkable. IMPRESSION: No acute fracture or dislocation. Electronically Signed   By: Anner Crete M.D.   On: 04/17/2020 21:16   EEG adult  Result Date: 04/16/2020 Lora Havens, MD     04/16/2020  1:37 PM Patient Name: SARGUN RUMMELL MRN: [id number] Epilepsy Attending: Lora Havens Referring Physician/Provider: Dr Oren Binet Date: 04/16/2020 Duration: 25.12 mins Patient history: 79yo F with ams. EEG to evaluate for seizure. Level of alertness: Awake AEDs during EEG study: xanax, pregabalin Technical aspects: This EEG study was done with scalp electrodes positioned according to the 10-20 International system of electrode placement. Electrical activity was acquired at a sampling rate of 500Hz  and reviewed with a high frequency filter  of 70Hz  and a low frequency filter of 1Hz . EEG data were recorded continuously and digitally stored. Description: The posterior dominant rhythm consists of 8 Hz activity of moderate voltage (25-35 uV) seen predominantly in posterior head regions, symmetric and reactive to eye opening and eye closing.  EEG showed intermittent generalized 3 to 6 Hz theta-delta slowing. Triphasic waves, generalized were also noted. Hyperventilation and photic stimulation were not performed.   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 No seizures or epileptiform discharges were seen throughout the recording. [place]   DG HIP UNILAT WITH PELVIS 2-3 VIEWS RIGHT  Result Date: 04/18/2020 CLINICAL DATA:  Acute onset right hip pain. No known injury. History of prior right hip replacement. EXAM: DG HIP (WITH OR WITHOUT PELVIS) 2-3V RIGHT COMPARISON:  Plain film of the pelvis 04/13/2020. PET CT scan 01/25/2020. FINDINGS: The first image which is the AP view is over penetrated. Right hip arthroplasty is in place. No hardware complication. No fracture, dislocation or focal bony lesion. Soft tissues are negative. IMPRESSION: No acute abnormality.  Status post right hip replacement. Electronically Signed   By: Inge Rise M.D.   On: 04/18/2020 15:03

## 2020-04-23 NOTE — Progress Notes (Signed)
Comfort care measures initiated this AM. Pt resting comfortably in bed.  Telesitter camera remains in place. Cardizem drip and telemetry d/c'd per MD.  Pt is speaking softly and is listening to music. Refusing oral meds, but is eating ice pops and drinking water/soda. See MAR for PRN meds given for comfort.   Pt's [name]) stated she and her two brothers would like to come visit, but she is unsure if they will be able to find a ride today.  Will continue to monitor patient and provide IV PRN meds for comfort.

## 2020-04-24 ENCOUNTER — Inpatient Hospital Stay: Payer: Medicare Other

## 2020-04-24 ENCOUNTER — Other Ambulatory Visit: Payer: Self-pay | Admitting: *Deleted

## 2020-04-24 ENCOUNTER — Ambulatory Visit: Payer: Medicare Other

## 2020-04-24 ENCOUNTER — Telehealth: Payer: Self-pay | Admitting: Family Medicine

## 2020-04-24 DIAGNOSIS — J441 Chronic obstructive pulmonary disease with (acute) exacerbation: Secondary | ICD-10-CM | POA: Diagnosis not present

## 2020-04-24 DIAGNOSIS — W19XXXA Unspecified fall, initial encounter: Secondary | ICD-10-CM | POA: Diagnosis not present

## 2020-04-24 DIAGNOSIS — J9611 Chronic respiratory failure with hypoxia: Secondary | ICD-10-CM | POA: Diagnosis not present

## 2020-04-24 DIAGNOSIS — U071 COVID-19: Secondary | ICD-10-CM | POA: Diagnosis not present

## 2020-04-24 LAB — CULTURE, BLOOD (ROUTINE X 2)
Culture: NO GROWTH
Culture: NO GROWTH

## 2020-04-24 MED ORDER — MORPHINE SULFATE (CONCENTRATE) 10 MG/0.5ML PO SOLN
10.0000 mg | Freq: Four times a day (QID) | ORAL | Status: DC
  Administered 2020-04-24 – 2020-04-26 (×9): 10 mg via ORAL
  Filled 2020-04-24 (×9): qty 0.5

## 2020-04-24 NOTE — Progress Notes (Addendum)
PROGRESS NOTE                                                                                                                                                                                                             Patient Demographics:    [name], is a 79 y.o. female, DOB - 03/29/1941, WCB:[id number]  Outpatient Primary MD for the patient is [name], FNP   Admit date - 04/13/2020   LOS - 10  Chief Complaint  Patient presents with  . Fall       Brief Narrative: Patient is a 79 y.o. 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 Hospital course complicated by lingering/waxing/waning encephalopathy-and severe failure to thrive syndrome with very poor oral intake.  Palliative care consulted-after extensive discussion with patient's daughter-she has been transitioned to full comfort measures on 11/28.   COVID-19 vaccinated status: Unvaccinated  Significant Events: 11/18>> Admit to [facility] for weakness/confusion/COVID-19 pneumonia  Significant studies: 11/18>> chest x-ray: Diffuse interstitial prominence throughout both lungs compatible with chronic lung disease/fibrosis-no definite airspace opacity 11/18>> pelvic x-ray: No acute bony abnormality. 11/18>> CT head/C-spine: No acute abnormality in head/no fractures in C-spine 11/19>> CT chest: Partially necrotic mass in the right lower lobe-fibrosis throughout both lungs.  CAD. 11/21>> EEG - This study is suggestive of mild diffuse encephalopathy, nonspecific etiology but likely related to toxic-metabolic etiology, anoxic/hypoxic brain injury. No seizures or epileptiform discharges were seen throughout the recording. 11/22 >> MRI brain: No acute  findings.  COVID-19 medications: Steroids: 11/18>>11/25 Remdesivir: 11/18>>11/22 Monoclonal antibody: 11/16 x 1  Antibiotics: Vancomycin: 11/21>> 11/22 Rocephin: 11/22>> 11/24 Zyvox: 11/23>>  Microbiology data: 11/24>> blood culture: No growth. 11/20>> blood cultures: Staph hominis (1/2) 11/18 >>blood culture: Staph hominis. 11/18>> urine culture: <10,007 colonies/mL-insignificant growth  Procedures: None  Consults: None  DVT prophylaxis: Not needed as patient transition to full comfort measures.    Subjective:   Mental status continues to wax and wane-at times she is a little bit more alert.  Oral intake continues to be poor-however yesterday she was able to drink water.  This morning-she was a bit more awake compared to yesterday-able to tell me  her daughter's name and then went back to sleep.   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  Suspect has some significant amount of radiation-induced fibrosis and a CT chest.  Has completed a course of Remdesivir and steroids.  Patient's respiratory symptoms were very mild-CT chest on 11/19 showed mostly fibrotic changes throughout both lungs.  Chart reviewed-had COVID-19 test positive on 11/11.  Has been on room air for the past several days.  Doubt requires further isolation-we will need to discuss with infection prevention.  Fever: afebrile O2 requirements:  SpO2: 95 % O2 Flow Rate (L/min): 2 L/min     Recent Labs  Lab 04/18/20 0149 04/18/20 0149 04/19/20 0048 04/19/20 0401 04/20/20 0035 04/21/20 0101 04/22/20 0412 04/23/20 0239  WBC 11.7*   < >  --  11.7* 10.7* 10.9* 10.0 9.6  HGB 12.3   < >  --  12.3 13.5 13.2 12.7 13.0  HCT 39.3   < >  --  39.2 43.4 42.9 40.0 42.3  PLT 447*   < >  --  419* 334 472* 394 328  CRP 1.5*  --  0.7 0.7 0.7  --   --   --   BNP 67.4  --   --  58.4  106.6*  --   --   --   DDIMER 1.95*  --  2.20* 2.12* 2.40*  --   --   --   PROCALCITON <0.10  --   --  <0.10  --   --   --   --   AST 31  --   --  29 34 31 50*  --   ALT 32  --   --  28 29 26 28   --   ALKPHOS 109  --   --  101 92 100 82  --   BILITOT 1.0  --   --  1.0 0.8 0.9 1.5*  --   ALBUMIN 2.7*  --   --  2.6* 3.0* 3.1* 2.9*  --    < > = values in this interval not displayed.    COPD exacerbation: Resolved-moving air well-completed steroids 11/25  Staph hominis bacteremia: Culture results as above-discussed with Dr. Carolyne Fiscal on-call on 11/25-chart reviewed over the phone-recommends total of 7 days of Zyvox from 11/23.  Unfortunately-patient refusing Zyvox-and is spitting out medications-no longer on any antimicrobial therapy..  Acute metabolic encephalopathy: Most likely COVID-19 inflammation related encephalopathy, CT, MRI and EEG all nonacute.  No focal deficits.  Continues to have waxing and waning encephalopathy-now with severe failure to thrive syndrome.  Currently under comfort measures-surprisingly-after some comfort medications on 11/28 she briefly became alert-but mentation continues to wax and wane.  Fall: Secondary debility/deconditioning at baseline-superimposed weakness due to COVID-19 infection.  Appreciate PT/OT eval-recommendations of SNF.  Anxiety/depression: All psychotropic medications were held to see if this improves her mentation-however no significant improvement-these medications were resumed on 11/27-however patient continues to refuse and spit out medications-hence psychotropic medications remain on hold.  Hypernatremia: Secondary to poor oral intake-severe failure to thrive syndrome-underlying malignancy and COVID-19 infection.   A. fib with RVR: Developed overnight on 11/27-started on Cardizem infusion.  Since transition to comfort measures-no longer needs rate control medications and on telemetry monitoring.    Chronic pain syndrome-opiate dependent: Given  encephalopathy-all narcotics were held initially-however since goals of care are  mostly for comfort-have started narcotics.  Squamous cell carcinoma of the lung-getting radiation treatments-resume outpatient follow-up with radiation oncology/medical oncology-could have some amount of fibrosis/inflammation in the lungs due to radiation.  On steroids.  Per daughter-patient on radiation treatment as was deemed to be a poor candidate for chemotherapy due to overall medical comorbidities/functional status.  Normocytic anemia:  History of VTE: Initially on oral anticoagulation-due to her not taking oral medications-this was changed to SQ Lovenox.  Since patient transitioned to comfort measures-all anticoagulation discontinued.  HTN: BP high, unable to take oral medications consistently-Nitropaste was added-since transitioned to comfort measures-no antihypertensives required.    HLD  GERD  Goals of care: DNR in place-admitted with COVID-19 infection/possible pneumonia-in a background of failure to thrive syndrome/squamous cell carcinoma of the lung.  Although her respiratory status stabilized-patient continued to deteriorate-with very poor oral intake-severe waxing and waning encephalopathy and severe failure to thrive syndrome.  She also developed hyponatremia and A. fib with RVR.  Palliative care following.  Patient subsequently transitioned to comfort measures-however daughter still struggling with decision for disposition-she is not sure if she wants patient transferred to residential hospice or have a home with hospice care.  She is in discussion with family members-I would talk to the palliative care team for further guidance as well.  GI prophylaxis: PPI   Condition - Stable  Family Communication  : Daughter [name] [phone]) over the phone on 11/28  Code Status :  Full Code  Diet :  Diet Order            Diet regular Room service appropriate? Yes; Fluid consistency: Thin  Diet  effective now                  Disposition Plan  :  Status is: Inpatient  The patient will require care spanning > 2 midnights and should be moved to inpatient because: Inpatient level of care appropriate due to severity of illness  Dispo: The patient is from: Home              Anticipated d/c is to: TBD              Anticipated d/c date is: 2 days              Patient currently is not medically stable to d/c.   Barriers to discharge: Severe failure to thrive syndrome-worsening hypernatremia-poor oral intake-starting comfort measures.  Antimicorbials  :    Anti-infectives (From admission, onward)   Start     Dose/Rate Route Frequency Ordered Stop   04/18/20 2200  linezolid (ZYVOX) tablet 600 mg  Status:  Discontinued        600 mg Oral Every 12 hours 04/18/20 1624 04/22/20 1525   04/17/20 1130  cefTRIAXone (ROCEPHIN) 1 g in sodium chloride 0.9 % 100 mL IVPB        1 g 200 mL/hr over 30 Minutes Intravenous Every 24 hours 04/17/20 1039 04/19/20 1444   04/16/20 2000  vancomycin (VANCOREADY) IVPB 750 mg/150 mL  Status:  Discontinued       "Followed by" Linked Group Details   750 mg 150 mL/hr over 60 Minutes Intravenous Every 24 hours 04/15/20 1647 04/18/20 1624   04/16/20 1700  vancomycin (VANCOREADY) IVPB 750 mg/150 mL  Status:  Discontinued       "Followed by" Linked Group Details   750 mg 150 mL/hr over 60 Minutes Intravenous Every 24 hours 04/15/20 1403 04/15/20 1647   04/15/20 2000  vancomycin (VANCOCIN) IVPB 1000 mg/200 mL premix       "Followed by" Linked Group Details   1,000 mg 200 mL/hr over 60 Minutes Intravenous  Once 04/15/20 1647 04/16/20 0223   04/15/20 1700  vancomycin (VANCOCIN) IVPB 1000 mg/200 mL premix  Status:  Discontinued       "Followed by" Linked Group Details   1,000 mg 200 mL/hr over 60 Minutes Intravenous  Once 04/15/20 1403 04/15/20 1647   04/14/20 1000  remdesivir 100 mg in sodium chloride 0.9 % 100 mL IVPB       "Followed by" Linked Group  Details   100 mg 200 mL/hr over 30 Minutes Intravenous Daily 04/13/20 2227 04/17/20 1555   04/13/20 2330  remdesivir 200 mg in sodium chloride 0.9% 250 mL IVPB       "Followed by" Linked Group Details   200 mg 580 mL/hr over 30 Minutes Intravenous Once 04/13/20 2227 04/14/20 0254      Inpatient Medications  Scheduled Meds: . clonazePAM  1 mg Oral TID  . sodium chloride flush  3 mL Intravenous Q12H   Continuous Infusions: . sodium chloride     PRN Meds:.sodium chloride, acetaminophen **OR** acetaminophen, atropine, haloperidol **OR** haloperidol **OR** haloperidol lactate, HYDROmorphone (DILAUDID) injection, LORazepam **OR** LORazepam **OR** LORazepam, LORazepam, ondansetron **OR** ondansetron (ZOFRAN) IV, sodium chloride flush   Time Spent in minutes  25  See all Orders from today for further details   Oren Binet M.D on 04/24/2020 at 10:17 AM  To page go to [email or web address] - use universal password  [facility] -  Office  [phone]    Objective:   Vitals:   04/23/20 0603 04/23/20 0753 04/23/20 0809 04/23/20 2300  BP: 129/70  132/74 107/80  Pulse: 87 (!) 105 (!) 102 (!) 102  Resp:  (!) 23 20 (!) 24  Temp:   97.9 F (36.6 C) 97.9 F (36.6 C)  TempSrc:   Oral Axillary  SpO2: (!) 85%  92% 95%  Weight:      Height:        Wt Readings from Last 3 Encounters:  04/23/20 66.5 kg     Intake/Output Summary (Last 24 hours) at 04/24/2020 1017 Last data filed at 04/24/2020 0857 Gross per 24 hour  Intake 333.54 ml  Output 350 ml  Net -16.46 ml     Physical Exam Lethargic-comfortable.  Able to tell me her daughter's name but still somewhat confused. Moving all 4 extremities.    Data Review:    CBC Recent Labs  Lab 04/18/20 0149 04/18/20 0149 04/19/20 0401 04/20/20 0035 04/21/20 0101 04/22/20 0412 04/23/20 0239  WBC 11.7*   < > 11.7* 10.7* 10.9* 10.0 9.6  HGB 12.3   < > 12.3 13.5 13.2 12.7 13.0  HCT 39.3   < > 39.2 43.4 42.9 40.0 42.3   PLT 447*   < > 419* 334 472* 394 328  MCV 82.6   < > 82.2 83.9 83.0 82.1 84.1  MCH 25.8*   < > 25.8* 26.1 25.5* 26.1 25.8*  MCHC 31.3   < > 31.4 31.1 30.8 31.8 30.7  RDW 18.6*   < > 18.6* 18.9* 18.7* 19.2* 19.9*  LYMPHSABS 0.8  --  1.0 0.6* 0.9 0.9  --   MONOABS 1.0  --  1.3* 0.3 1.3* 1.1*  --   EOSABS 0.0  --  0.0 0.0 0.0 0.0  --   BASOSABS 0.0  --  0.0 0.0 0.0 0.0  --    < > =  values in this interval not displayed.    Chemistries  Recent Labs  Lab 04/18/20 0149 04/18/20 0149 04/19/20 0401 04/20/20 0035 04/21/20 0101 04/22/20 0412 04/23/20 0239  NA 138   < > 140 140 144 145 150*  K 3.2*   < > 3.4* 4.1 4.0 4.1 3.1*  CL 102   < > 105 106 105 112* 112*  CO2 20*   < > 21* 19* 23 20* 25  GLUCOSE 105*   < > 112* 89 128* 125* 112*  BUN 27*   < > 28* 25* 33* 33* 31*  CREATININE 0.91   < > 0.90 0.74 1.00 0.95 1.05*  CALCIUM 9.4   < > 9.4 9.6 10.4* 9.5 9.6  MG 2.0  --  2.1 2.1  --   --   --   AST 31  --  29 34 31 50*  --   ALT 32  --  28 29 26 28   --   ALKPHOS 109  --  101 92 100 82  --   BILITOT 1.0  --  1.0 0.8 0.9 1.5*  --    < > = values in this interval not displayed.   ------------------------------------------------------------------------------------------------------------------ No results for input(s): CHOL, HDL, LDLCALC, TRIG, CHOLHDL, LDLDIRECT in the last 72 hours.  No results found for: HGBA1C ------------------------------------------------------------------------------------------------------------------ No results for input(s): TSH, T4TOTAL, T3FREE, THYROIDAB in the last 72 hours.  Invalid input(s): FREET3 ------------------------------------------------------------------------------------------------------------------ No results for input(s): VITAMINB12, FOLATE, FERRITIN, TIBC, IRON, RETICCTPCT in the last 72 hours.  Coagulation profile No results for input(s): INR, PROTIME in the last 168 hours.  No results for input(s): DDIMER in the last 72  hours.  Cardiac Enzymes No results for input(s): CKMB, TROPONINI, MYOGLOBIN in the last 168 hours.  Invalid input(s): CK ------------------------------------------------------------------------------------------------------------------    Component Value Date/Time   BNP 106.6 (H) 04/20/2020 0035    Micro Results Recent Results (from the past 240 hour(s))  Culture, blood (routine x 2)     Status: Abnormal   Collection Time: 04/15/20  6:06 PM   Specimen: BLOOD LEFT ARM  Result Value Ref Range Status   Specimen Description BLOOD LEFT ARM  Final   Special Requests   Final    BOTTLES DRAWN AEROBIC ONLY Blood Culture results may not be optimal due to an inadequate volume of blood received in culture bottles   Culture  Setup Time   Final    GRAM POSITIVE COCCI IN CLUSTERS AEROBIC BOTTLE ONLY CRITICAL VALUE NOTED.  VALUE IS CONSISTENT WITH PREVIOUSLY REPORTED AND CALLED VALUE. Performed at [facility], [place] [address] [place],  [address]    Culture STAPHYLOCOCCUS HOMINIS (A)  Final   Report Status 04/18/2020 FINAL  Final   Organism ID, Bacteria STAPHYLOCOCCUS HOMINIS  Final      Susceptibility   Staphylococcus hominis - MIC*    CIPROFLOXACIN >=8 RESISTANT Resistant     ERYTHROMYCIN >=8 RESISTANT Resistant     GENTAMICIN <=0.5 SENSITIVE Sensitive     OXACILLIN >=4 RESISTANT Resistant     TETRACYCLINE >=16 RESISTANT Resistant     VANCOMYCIN <=0.5 SENSITIVE Sensitive     TRIMETH/SULFA 80 RESISTANT Resistant     CLINDAMYCIN RESISTANT Resistant     RIFAMPIN <=0.5 SENSITIVE Sensitive     Inducible Clindamycin POSITIVE Resistant     LINEZOLID Value in next row Sensitive      SENSITIVE2    * STAPHYLOCOCCUS HOMINIS  Culture, blood (routine x 2)  Status: None   Collection Time: 04/15/20  6:45 PM   Specimen: BLOOD  Result Value Ref Range Status   Specimen Description BLOOD SITE NOT SPECIFIED  Final   Special Requests   Final    BOTTLES DRAWN AEROBIC AND ANAEROBIC  Blood Culture adequate volume   Culture   Final    NO GROWTH 5 DAYS Performed at [facility], [address], [place], [place] [address]    Report Status 04/20/2020 FINAL  Final  Culture, blood (routine x 2)     Status: None (Preliminary result)   Collection Time: 04/19/20  1:03 PM   Specimen: BLOOD  Result Value Ref Range Status   Specimen Description BLOOD LEFT ANTECUBITAL  Final   Special Requests   Final    BOTTLES DRAWN AEROBIC ONLY Blood Culture results may not be optimal due to an inadequate volume of blood received in culture bottles   Culture   Final    NO GROWTH 4 DAYS Performed at [facility], [place] [address], [place], [place] [address]    Report Status PENDING  Incomplete  Culture, blood (routine x 2)     Status: None (Preliminary result)   Collection Time: 04/19/20  6:26 PM   Specimen: BLOOD  Result Value Ref Range Status   Specimen Description BLOOD LEFT ANTECUBITAL  Final   Special Requests   Final    BOTTLES DRAWN AEROBIC AND ANAEROBIC Blood Culture results may not be optimal due to an excessive volume of blood received in culture bottles   Culture   Final    NO GROWTH 4 DAYS Performed at [facility], [place] [address], [place], [place] [address]    Report Status PENDING  Incomplete    Radiology Reports CT Head Wo Contrast  Result Date: 04/13/2020 CLINICAL DATA:  79 year old female with head trauma. EXAM: CT HEAD WITHOUT CONTRAST CT CERVICAL SPINE WITHOUT CONTRAST TECHNIQUE: Multidetector CT imaging of the head and cervical spine was performed following the standard protocol without intravenous contrast. Multiplanar CT image reconstructions of the cervical spine were also generated. COMPARISON:  CT dated 08/28/2019. FINDINGS: CT HEAD FINDINGS Brain: Mild age-related atrophy and chronic microvascular ischemic changes. There is no acute intracranial hemorrhage. No mass effect or midline shift. No extra-axial fluid collection. Vascular: No hyperdense  vessel or unexpected calcification. Skull: Normal. Negative for fracture or focal lesion. Sinuses/Orbits: No acute finding. Other: None CT CERVICAL SPINE FINDINGS Alignment: No acute subluxation. Skull base and vertebrae: No acute fracture. Osteopenia. Soft tissues and spinal canal: No prevertebral fluid or swelling. No visible canal hematoma. Disc levels:  Degenerative changes. No acute findings. Upper chest: Small left upper lobe calcified granuloma. Partially visualized right subclavian central venous line. Other: Bilateral carotid bulb calcified plaques. IMPRESSION: 1. No acute intracranial pathology. Mild age-related atrophy and chronic microvascular ischemic changes. 2. No acute/traumatic cervical spine pathology. Electronically Signed   By: Anner Crete M.D.   On: 04/13/2020 20:44   CT CHEST WO CONTRAST  Result Date: 04/14/2020 CLINICAL DATA:  Respiratory illness EXAM: CT CHEST WITHOUT CONTRAST TECHNIQUE: Multidetector CT imaging of the chest was performed following the standard protocol without IV contrast. COMPARISON:  Chest x-ray 04/13/2020.  CT 04/11/2020 FINDINGS: Cardiovascular: Aortic atherosclerosis. Scattered coronary artery calcifications. Heart is normal size and aorta is normal caliber. Mediastinum/Nodes: Small scattered non pathologically enlarged mediastinal lymph nodes, stable. No mediastinal, hilar, or axillary adenopathy. Trachea and esophagus are unremarkable. Thyroid unremarkable. Lungs/Pleura: Peripheral interstitial prominence in thickening  compatible with fibrosis, stable. Rounded partially necrotic mass in the posterior right lower lobe measures 3.1 cm and is slightly decreased in size since prior study when this measured 3.8 cm. No effusions. No confluent opacities on the left. Calcified granuloma in the left upper lobe. Upper Abdomen: Imaging into the upper abdomen demonstrates no acute findings. Musculoskeletal: Chest wall soft tissues are unremarkable. Right upper chest  wall Port-A-Cath remains in place, unchanged. No acute bony abnormality. Old posterior right 7th rib fracture and upper thoracic compression fractures, unchanged. IMPRESSION: Partially necrotic mass in the posterior right lower lobe is again noted, slightly decreased in size since prior study, 3.1 cm currently compared to 3.8 cm previously. Changes of fibrosis throughout the lungs. Coronary artery disease. Aortic Atherosclerosis (ICD10-I70.0). Electronically Signed   By: Rolm Baptise M.D.   On: 04/14/2020 18:33   CT Cervical Spine Wo Contrast  Result Date: 04/13/2020 CLINICAL DATA:  79 year old female with head trauma. EXAM: CT HEAD WITHOUT CONTRAST CT CERVICAL SPINE WITHOUT CONTRAST TECHNIQUE: Multidetector CT imaging of the head and cervical spine was performed following the standard protocol without intravenous contrast. Multiplanar CT image reconstructions of the cervical spine were also generated. COMPARISON:  CT dated 08/28/2019. FINDINGS: CT HEAD FINDINGS Brain: Mild age-related atrophy and chronic microvascular ischemic changes. There is no acute intracranial hemorrhage. No mass effect or midline shift. No extra-axial fluid collection. Vascular: No hyperdense vessel or unexpected calcification. Skull: Normal. Negative for fracture or focal lesion. Sinuses/Orbits: No acute finding. Other: None CT CERVICAL SPINE FINDINGS Alignment: No acute subluxation. Skull base and vertebrae: No acute fracture. Osteopenia. Soft tissues and spinal canal: No prevertebral fluid or swelling. No visible canal hematoma. Disc levels:  Degenerative changes. No acute findings. Upper chest: Small left upper lobe calcified granuloma. Partially visualized right subclavian central venous line. Other: Bilateral carotid bulb calcified plaques. IMPRESSION: 1. No acute intracranial pathology. Mild age-related atrophy and chronic microvascular ischemic changes. 2. No acute/traumatic cervical spine pathology. Electronically Signed   By:  Anner Crete M.D.   On: 04/13/2020 20:44   MR BRAIN WO CONTRAST  Result Date: 04/17/2020 CLINICAL DATA:  Coronavirus infection. Altered mental status. History of squamous cell carcinoma of the lung. EXAM: MRI HEAD WITHOUT CONTRAST TECHNIQUE: Multiplanar, multiecho pulse sequences of the brain and surrounding structures were obtained without intravenous contrast. COMPARISON:  Head CT 04/13/2020.  MRI 03/04/2020. FINDINGS: Brain: Diffusion imaging does not show any acute or subacute infarction or other cause of restricted diffusion. No abnormality affects the brainstem or cerebellum. Cerebral hemispheres show age related atrophy with mild chronic small-vessel ischemic changes of the white matter, often seen at this age. No cortical or large vessel territory infarction. No mass lesion, hemorrhage, hydrocephalus or extra-axial collection. Vascular: Major vessels at the base of the brain show flow. Skull and upper cervical spine: Negative Sinuses/Orbits: Sinuses are clear except for a small amount of fluid layering in the left division of the sphenoid sinus, not likely significant. Orbits negative. Previous lens implant on right Other: None IMPRESSION: No acute finding. Age related atrophy. 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 Electronically Signed   By: Nelson Chimes M.D.   On: 04/17/2020 10:50   DG Pelvis Portable  Result Date: 04/13/2020 CLINICAL DATA:  Fall EXAM: PORTABLE PELVIS 1-2 VIEWS COMPARISON:  None. FINDINGS: Prior right hip replacement. Normal AP alignment. Mild degenerative changes within the left hip. SI joints symmetric and unremarkable. No acute bony abnormality. Specifically, no fracture,  subluxation, or dislocation. IMPRESSION: No acute bony abnormality. Electronically Signed   By: Rolm Baptise M.D.   On: 04/13/2020 19:21   DG Chest Port 1 View  Result Date: 04/13/2020 CLINICAL DATA:  Fall, COVID EXAM: PORTABLE CHEST 1 VIEW  COMPARISON:  04/11/2020 FINDINGS: Right Port-A-Cath remains in place, unchanged. Heart is normal size. Low lung volumes. Diffuse interstitial prominence throughout the lungs compatible with chronic lung disease/fibrosis. No definite acute confluent airspace opacity. Previously seen rounded mass posteriorly in the right lung better seen on prior CT. No effusions. IMPRESSION: Stable appearance of the chest. Electronically Signed   By: Rolm Baptise M.D.   On: 04/13/2020 19:21   DG Ankle Right Port  Result Date: 04/17/2020 CLINICAL DATA:  79 year old female with soft tissue swelling of the lateral malleolus. No known injury. EXAM: PORTABLE RIGHT ANKLE - 2 VIEW; RIGHT FOOT - 2 VIEW COMPARISON:  None. FINDINGS: There is no acute fracture or dislocation. The bones are osteopenic. The ankle mortise is intact. The soft tissues are unremarkable. IMPRESSION: No acute fracture or dislocation. Electronically Signed   By: Anner Crete M.D.   On: 04/17/2020 21:16   DG Foot 2 Views Right  Result Date: 04/17/2020 CLINICAL DATA:  79 year old female with soft tissue swelling of the lateral malleolus. No known injury. EXAM: PORTABLE RIGHT ANKLE - 2 VIEW; RIGHT FOOT - 2 VIEW COMPARISON:  None. FINDINGS: There is no acute fracture or dislocation. The bones are osteopenic. The ankle mortise is intact. The soft tissues are unremarkable. IMPRESSION: No acute fracture or dislocation. Electronically Signed   By: Anner Crete M.D.   On: 04/17/2020 21:16   EEG adult  Result Date: 04/16/2020 Lora Havens, MD     04/16/2020  1:37 PM Patient Name: HAYLI MILLIGAN MRN: [id number] Epilepsy Attending: Lora Havens Referring Physician/Provider: Dr Oren Binet Date: 04/16/2020 Duration: 25.12 mins Patient history: 79yo F with ams. EEG to evaluate for seizure. Level of alertness: Awake AEDs during EEG study: xanax, pregabalin Technical aspects: This EEG study was done with scalp electrodes positioned according to the  10-20 International system of electrode placement. Electrical activity was acquired at a sampling rate of 500Hz  and reviewed with a high frequency filter of 70Hz  and a low frequency filter of 1Hz . EEG data were recorded continuously and digitally stored. Description: The posterior dominant rhythm consists of 8 Hz activity of moderate voltage (25-35 uV) seen predominantly in posterior head regions, symmetric and reactive to eye opening and eye closing.  EEG showed intermittent generalized 3 to 6 Hz theta-delta slowing. Triphasic waves, generalized were also noted. Hyperventilation and photic stimulation were not performed.   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 No seizures or epileptiform discharges were seen throughout the recording. [place]   DG HIP UNILAT WITH PELVIS 2-3 VIEWS RIGHT  Result Date: 04/18/2020 CLINICAL DATA:  Acute onset right hip pain. No known injury. History of prior right hip replacement. EXAM: DG HIP (WITH OR WITHOUT PELVIS) 2-3V RIGHT COMPARISON:  Plain film of the pelvis 04/13/2020. PET CT scan 01/25/2020. FINDINGS: The first image which is the AP view is over penetrated. Right hip arthroplasty is in place. No hardware complication. No fracture, dislocation or focal bony lesion. Soft tissues are negative. IMPRESSION: No acute abnormality.  Status post right hip replacement. Electronically Signed   By: Inge Rise M.D.  On: 04/18/2020 15:03

## 2020-04-24 NOTE — Telephone Encounter (Signed)
[name] her daughter called to tell us that Patient is moving into Hospice. EM

## 2020-04-24 NOTE — Telephone Encounter (Signed)
Called and spoke to patient's daughter. She is trying to decide between bringing her Mother home versus going to inpatient Hospice. Answered questions and offered support.

## 2020-04-24 NOTE — TOC Progression Note (Signed)
Transition of Care [facility]) - Progression Note    Patient Details  Name: [name] MRN: [id number] Date of Birth: [birth year]  Transition of Care [facility]) CM/SW [place], LCSW Phone Number: 04/24/2020, 6:06 PM  Clinical Narrative:    CSW received consult regarding Hospice placement. CSW spoke with patient's daughter to confirm. She stated that [facility] or [place] (the two [place]) are too far for her to drive. She requests [facility]. CSW will consult with [facility] to see if they would be able to accept patient since it has been ten days past her initial diagnosis.    Expected Discharge Plan: [place] Barriers to Discharge: Requiring sitter/restraints, Continued Medical Work up  Expected Discharge Plan and Services Expected Discharge Plan: [place] In-house Referral: Clinical Social Work Discharge Planning Services: CM Consult Post Acute Care Choice: [place] arrangements for the past 2 months: Mobile Home                           HH Arranged: RN, PT, OT [facility] Agency: [place] Date [place]: 04/17/20 Time [place]: [id number] Representative spoke with at [place]: [place] Interventions    Readmission Risk Interventions No flowsheet data found.

## 2020-04-24 NOTE — Plan of Care (Signed)
Patient is currently resting in bed. Patient comes and goes with alertness. At times she is very lethargic with no response to voice or touch however at other times as soon as staff enter the room patient is waving and smiling. When asked orientation questions patient is has no response, just looks off in space. Unable to take sips of water or eat, will just close her eyes when offered a sip of water. These burst of alertness will last 5 mins or less and then patient will go back to being lethargic. Patient turns her self from side to side in the bed. [place] in place. Hourly rounding throughout the shift. [name] visited overnight. Call bell within reach. Bed alarm on. Video monitor d/c due to patient being on comfort care.  Problem: Coping: Goal: Ability to identify and develop effective coping behavior will improve Outcome: Progressing   Problem: Clinical Measurements: Goal: Quality of life will improve Outcome: Progressing   Problem: Respiratory: Goal: Verbalizations of increased ease of respirations will increase Outcome: Progressing   Problem: Role Relationship: Goal: Family's ability to cope with current situation will improve Outcome: Progressing   Problem: Role Relationship: Goal: Ability to verbalize concerns, feelings, and thoughts to partner or family member will improve Outcome: Progressing   Problem: Pain Management: Goal: Satisfaction with pain management regimen will improve Outcome: Progressing   Problem: Education: Goal: Knowledge of the prescribed therapeutic regimen will improve Outcome: Progressing

## 2020-04-24 NOTE — Progress Notes (Signed)
Palliative Care Progress Note  [name] is more awake and alert today after receiving haldol and morphine yesterday for pain and agitation. 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  I spoke with her daughter [name] at length re her mothers condition. Her mother's overall prognosis remains very poor. 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 She is still not eating or taking PO and her mental status fluctuates. She is also very physically deconditioned.  Goals are comfort and QOL -daughter understands this and wants her well cared for as she approaches EOL. We discussed that a hospice unit may be the best plan given her symptom management needs.  Recommendations: 1. Hospice Facility for disposition- she has symptom management needs and prognostically her time is very limited.  2. Maintain comfort oriented approach to care including opioids and medications for sleep and agitation.  3. Comfort feeding only.  4. Comfort Meds only.  Nicole Hacker, DO Palliative Medicine [phone]  Time: 35 min Greater than 50%  of this time was spent counseling and coordinating care related to the above assessment and plan.

## 2020-04-25 ENCOUNTER — Ambulatory Visit: Payer: Medicare Other

## 2020-04-25 ENCOUNTER — Inpatient Hospital Stay: Payer: Medicare Other

## 2020-04-25 DIAGNOSIS — U071 COVID-19: Secondary | ICD-10-CM | POA: Diagnosis not present

## 2020-04-25 MED ORDER — GUAIFENESIN-DM 100-10 MG/5ML PO SYRP
10.0000 mL | ORAL_SOLUTION | Freq: Four times a day (QID) | ORAL | Status: DC | PRN
  Administered 2020-04-25: 10 mL via ORAL
  Filled 2020-04-25: qty 10

## 2020-04-25 NOTE — Care Management Important Message (Signed)
Important Message  Patient Details  Name: [name] MRN: [id number] Date of Birth: Sep 24, 1940   Medicare Important Message Given:  Yes - Important Message mailed due to current National Emergency   Verbal consent obtained due to current National Emergency  Relationship to patient: Self Contact Name: Merriel Zinger Call Date: 04/25/20  Time: 1355 Phone: [phone] Outcome: No Answer/Busy Important Message mailed to: Patient address on file    [name] 04/25/2020, 1:55 PM

## 2020-04-25 NOTE — TOC Progression Note (Signed)
Transition of Care [facility]) - Progression Note    Patient Details  Name: MARKI FREDE MRN: [id number] Date of Birth: 18-May-1941  Transition of Care [facility]) CM/SW [place], LCSW Phone Number: 04/25/2020, 10:12 AM  Clinical Narrative:    CSW discussed case with [facility], Melissa. She reported that patient would have to be out of COVID isolation for [place] to consider her. CSW spoke with MD and he reported that patient can come off of isolation as her first test was on 11/10 and she is not symptomatic. Melissa will send referral for review.    Expected Discharge Plan: [place] Barriers to Discharge: Requiring sitter/restraints, Continued Medical Work up  Expected Discharge Plan and Services Expected Discharge Plan: [place] In-house Referral: Clinical Social Work Discharge Planning Services: CM Consult Post Acute Care Choice: [place] arrangements for the past 2 months: Mobile Home                           HH Arranged: RN, PT, OT [facility] Agency: [place] Date [place]: 04/17/20 Time [place]: [id number] Representative spoke with at [place]: [place] ([place]) Interventions    Readmission Risk Interventions No flowsheet data found.

## 2020-04-25 NOTE — Progress Notes (Signed)
PROGRESS NOTE                                                                                                                                                                                                             Patient Demographics:    [name], is a 79 y.o. female, DOB - February 05, 1941, EHO:[id number]  Outpatient Primary MD for the patient is [name], FNP   Admit date - 04/13/2020   LOS - 11  Chief Complaint  Patient presents with  . Fall       Brief Narrative: Patient is a 79 y.o. 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 Hospital course complicated by lingering/waxing/waning encephalopathy-and severe failure to thrive syndrome with very poor oral intake.  Palliative care consulted-after extensive discussion with patient's daughter-she has been transitioned to full comfort measures on 11/28.   COVID-19 vaccinated status: Unvaccinated  Significant Events: 11/18>> Admit to [facility] for weakness/confusion/COVID-19 pneumonia  Significant studies: 11/18>> chest x-ray: Diffuse interstitial prominence throughout both lungs compatible with chronic lung disease/fibrosis-no definite airspace opacity 11/18>> pelvic x-ray: No acute bony abnormality. 11/18>> CT head/C-spine: No acute abnormality in head/no fractures in C-spine 11/19>> CT chest: Partially necrotic mass in the right lower lobe-fibrosis throughout both lungs.  CAD. 11/21>> EEG - This study is suggestive of mild diffuse encephalopathy, nonspecific etiology but likely related to toxic-metabolic etiology, anoxic/hypoxic brain injury. No seizures or epileptiform discharges were seen throughout the  recording. 11/22 >> MRI brain: No acute findings.  COVID-19 medications: Steroids: 11/18>>11/25 Remdesivir: 11/18>>11/22 Monoclonal antibody: 11/16 x 1  Antibiotics: Vancomycin: 11/21>> 11/22 Rocephin: 11/22>> 11/24 Zyvox: 11/23>>  Microbiology data: 11/24>> blood culture: No growth. 11/20>> blood cultures: Staph hominis (1/2) 11/18 >>blood culture: Staph hominis. 11/18>> urine culture: <10,007 colonies/mL-insignificant growth  Procedures: None  Consults: None  DVT prophylaxis: Not needed as patient transition to full comfort measures.    Subjective:   Sleeping comfortably-briefly awakens-no major events overnight.  Hardly any oral intake over the past few days-per RN-patient drinking some liquids here and there.   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  Suspect has some significant amount of radiation-induced fibrosis and a CT chest.  Has completed a course of Remdesivir and steroids.  Patient's respiratory symptoms were very mild-CT chest on 11/19 showed mostly fibrotic changes throughout both lungs.  Chart reviewed (has another med rec no)-had COVID-19 test positive on 11/11-but was already having symptoms already for a few days.  Given mild symptoms-doubt patient requires isolation more than 14 days-however it appears that patient already is more than 21 days out from day of of her symptoms.  Have asked RN to see if we can discontinue isolation after talking with infection prevention...  Fever: afebrile O2 requirements:  SpO2: 96 % O2 Flow Rate (L/min): 2 L/min     Recent Labs  Lab 04/19/20 0048 04/19/20 0401 04/20/20 0035 04/21/20 0101 04/22/20 0412 04/23/20 0239  WBC  --  11.7* 10.7* 10.9* 10.0 9.6  HGB  --  12.3 13.5 13.2 12.7 13.0  HCT  --  39.2 43.4 42.9 40.0 42.3  PLT  --  419* 334 472* 394 328  CRP 0.7 0.7 0.7   --   --   --   BNP  --  58.4 106.6*  --   --   --   DDIMER 2.20* 2.12* 2.40*  --   --   --   PROCALCITON  --  <0.10  --   --   --   --   AST  --  29 34 31 50*  --   ALT  --  28 29 26 28   --   ALKPHOS  --  101 92 100 82  --   BILITOT  --  1.0 0.8 0.9 1.5*  --   ALBUMIN  --  2.6* 3.0* 3.1* 2.9*  --     COPD exacerbation: Resolved-moving air well-completed steroids 11/25  Staph hominis bacteremia: Culture results as above-discussed with Dr. Carolyne Fiscal on-call on 11/25-chart reviewed over the phone-recommends total of 7 days of Zyvox from 11/23.  Unfortunately-patient refusing Zyvox-and is spitting out medications-no longer on any antimicrobial therapy..  Acute metabolic encephalopathy: Most likely COVID-19 inflammation related encephalopathy, CT, MRI and EEG all nonacute.  No focal deficits.  Continues to have waxing and waning encephalopathy-now with severe failure to thrive syndrome.  Currently under comfort measures-surprisingly-after some comfort medications on 11/28 she briefly became alert-but mentation continues to wax and wane.  Fall: Secondary debility/deconditioning at baseline-superimposed weakness due to COVID-19 infection.  Appreciate PT/OT eval-recommendations of SNF.  Anxiety/depression: All psychotropic medications were held to see if this improves her mentation-however no significant improvement-these medications were resumed on 11/27-however patient continues to refuse and spit out medications-hence psychotropic medications remain on hold.  Hypernatremia: Secondary to poor oral intake-severe failure to thrive syndrome-underlying malignancy and COVID-19 infection.   A. fib with RVR: Developed overnight on 11/27-started on Cardizem infusion.  Since transition to comfort measures-no longer needs rate control medications and on telemetry monitoring.    Chronic pain syndrome-opiate dependent: Given encephalopathy-all narcotics were held initially-however since goals of care are  mostly for comfort-have started narcotics.  Squamous cell carcinoma of the lung-getting radiation treatments-resume outpatient follow-up with radiation oncology/medical oncology-could have some amount of fibrosis/inflammation in the lungs due to radiation.  On steroids.  Per daughter-patient on radiation treatment as was deemed to be a poor candidate for chemotherapy due to overall medical comorbidities/functional status.  Normocytic anemia:  History of VTE: Initially on oral anticoagulation-due to her not taking oral medications-this was changed to SQ Lovenox.  Since patient transitioned to comfort measures-all anticoagulation discontinued.  HTN: BP high, unable to take oral medications consistently-Nitropaste was added-since transitioned to comfort measures-no antihypertensives required.    HLD  GERD  Goals of care: DNR in place-admitted with COVID-19 infection/possible pneumonia-in a background of failure to thrive syndrome/squamous cell carcinoma of the lung.  Although her respiratory status stabilized-patient continued to deteriorate-with very poor oral intake-severe waxing and waning encephalopathy and severe failure to thrive syndrome.  She also developed hyponatremia and A. fib with RVR.  Palliative care following.  Patient subsequently transitioned to comfort measures-palliative care following-who spoke with daughter yesterday-recommendations are to proceed with residential hospice placement.  GI prophylaxis: PPI   Condition - Stable  Family Communication  : Daughter [name] [phone]) over the phone on 11/30  Code Status :  Full Code  Diet :  Diet Order            Diet regular Room service appropriate? Yes; Fluid consistency: Thin  Diet effective now                  Disposition Plan  :  Status is: Inpatient  The patient will require care spanning > 2 midnights and should be moved to inpatient because: Inpatient level of care appropriate due to severity of  illness  Dispo: The patient is from: Home              Anticipated d/c is to: TBD              Anticipated d/c date is: 2 days              Patient currently is not medically stable to d/c.   Barriers to discharge: Severe failure to thrive syndrome-worsening hypernatremia-poor oral intake-starting comfort measures.  Antimicorbials  :    Anti-infectives (From admission, onward)   Start     Dose/Rate Route Frequency Ordered Stop   04/18/20 2200  linezolid (ZYVOX) tablet 600 mg  Status:  Discontinued        600 mg Oral Every 12 hours 04/18/20 1624 04/22/20 1525   04/17/20 1130  cefTRIAXone (ROCEPHIN) 1 g in sodium chloride 0.9 % 100 mL IVPB        1 g 200 mL/hr over 30 Minutes Intravenous Every 24 hours 04/17/20 1039 04/19/20 1444   04/16/20 2000  vancomycin (VANCOREADY) IVPB 750 mg/150 mL  Status:  Discontinued       "Followed by" Linked Group Details   750 mg 150 mL/hr over 60 Minutes Intravenous Every 24 hours 04/15/20 1647 04/18/20 1624   04/16/20 1700  vancomycin (VANCOREADY) IVPB 750 mg/150 mL  Status:  Discontinued       "Followed by" Linked Group Details   750 mg 150 mL/hr over 60 Minutes Intravenous Every 24 hours 04/15/20 1403 04/15/20 1647   04/15/20 2000  vancomycin (VANCOCIN) IVPB 1000 mg/200 mL premix       "Followed by" Linked Group Details   1,000 mg 200 mL/hr over 60 Minutes Intravenous  Once 04/15/20 1647 04/16/20 0223   04/15/20 1700  vancomycin (VANCOCIN) IVPB 1000 mg/200 mL premix  Status:  Discontinued       "Followed by" Linked Group Details   1,000 mg 200 mL/hr over 60 Minutes Intravenous  Once 04/15/20 1403 04/15/20 1647   04/14/20 1000  remdesivir 100 mg in sodium chloride 0.9 % 100 mL  IVPB       "Followed by" Linked Group Details   100 mg 200 mL/hr over 30 Minutes Intravenous Daily 04/13/20 2227 04/17/20 1555   04/13/20 2330  remdesivir 200 mg in sodium chloride 0.9% 250 mL IVPB       "Followed by" Linked Group Details   200 mg 580 mL/hr over 30  Minutes Intravenous Once 04/13/20 2227 04/14/20 0254      Inpatient Medications  Scheduled Meds: . clonazePAM  1 mg Oral TID  . morphine CONCENTRATE  10 mg Oral Q6H  . sodium chloride flush  3 mL Intravenous Q12H   Continuous Infusions: . sodium chloride     PRN Meds:.sodium chloride, acetaminophen **OR** acetaminophen, atropine, haloperidol **OR** haloperidol **OR** haloperidol lactate, HYDROmorphone (DILAUDID) injection, LORazepam **OR** LORazepam **OR** LORazepam, LORazepam, ondansetron **OR** ondansetron (ZOFRAN) IV, sodium chloride flush   Time Spent in minutes  15  See all Orders from today for further details   Oren Binet M.D on 04/25/2020 at 11:14 AM  To page go to [email or web address] - use universal password  [facility] -  Office  [phone]    Objective:   Vitals:   04/23/20 0809 04/23/20 2300 04/24/20 1220 04/24/20 2338  BP: 132/74 107/80 135/88 (!) 127/98  Pulse: (!) 102 (!) 102 77 93  Resp: 20 (!) 24 20 18   Temp: 97.9 F (36.6 C) 97.9 F (36.6 C) 98.7 F (37.1 C) 98.1 F (36.7 C)  TempSrc: Oral Axillary Oral Axillary  SpO2: 92% 95% 96% 96%  Weight:      Height:        Wt Readings from Last 3 Encounters:  04/23/20 66.5 kg     Intake/Output Summary (Last 24 hours) at 04/25/2020 1114 Last data filed at 04/25/2020 0700 Gross per 24 hour  Intake 540 ml  Output --  Net 540 ml     Physical Exam Lethargic-opens eyes-but not in any distress    Data Review:    CBC Recent Labs  Lab 04/19/20 0401 04/20/20 0035 04/21/20 0101 04/22/20 0412 04/23/20 0239  WBC 11.7* 10.7* 10.9* 10.0 9.6  HGB 12.3 13.5 13.2 12.7 13.0  HCT 39.2 43.4 42.9 40.0 42.3  PLT 419* 334 472* 394 328  MCV 82.2 83.9 83.0 82.1 84.1  MCH 25.8* 26.1 25.5* 26.1 25.8*  MCHC 31.4 31.1 30.8 31.8 30.7  RDW 18.6* 18.9* 18.7* 19.2* 19.9*  LYMPHSABS 1.0 0.6* 0.9 0.9  --   MONOABS 1.3* 0.3 1.3* 1.1*  --   EOSABS 0.0 0.0 0.0 0.0  --   BASOSABS 0.0 0.0 0.0 0.0  --      Chemistries  Recent Labs  Lab 04/19/20 0401 04/20/20 0035 04/21/20 0101 04/22/20 0412 04/23/20 0239  NA 140 140 144 145 150*  K 3.4* 4.1 4.0 4.1 3.1*  CL 105 106 105 112* 112*  CO2 21* 19* 23 20* 25  GLUCOSE 112* 89 128* 125* 112*  BUN 28* 25* 33* 33* 31*  CREATININE 0.90 0.74 1.00 0.95 1.05*  CALCIUM 9.4 9.6 10.4* 9.5 9.6  MG 2.1 2.1  --   --   --   AST 29 34 31 50*  --   ALT 28 29 26 28   --   ALKPHOS 101 92 100 82  --   BILITOT 1.0 0.8 0.9 1.5*  --    ------------------------------------------------------------------------------------------------------------------ No results for input(s): CHOL, HDL, LDLCALC, TRIG, CHOLHDL, LDLDIRECT in the last 72 hours.  No results found for: HGBA1C ------------------------------------------------------------------------------------------------------------------ No results for input(s): TSH,  T4TOTAL, T3FREE, THYROIDAB in the last 72 hours.  Invalid input(s): FREET3 ------------------------------------------------------------------------------------------------------------------ No results for input(s): VITAMINB12, FOLATE, FERRITIN, TIBC, IRON, RETICCTPCT in the last 72 hours.  Coagulation profile No results for input(s): INR, PROTIME in the last 168 hours.  No results for input(s): DDIMER in the last 72 hours.  Cardiac Enzymes No results for input(s): CKMB, TROPONINI, MYOGLOBIN in the last 168 hours.  Invalid input(s): CK ------------------------------------------------------------------------------------------------------------------    Component Value Date/Time   BNP 106.6 (H) 04/20/2020 0035    Micro Results Recent Results (from the past 240 hour(s))  Culture, blood (routine x 2)     Status: Abnormal   Collection Time: 04/15/20  6:06 PM   Specimen: BLOOD LEFT ARM  Result Value Ref Range Status   Specimen Description BLOOD LEFT ARM  Final   Special Requests   Final    BOTTLES DRAWN AEROBIC ONLY Blood Culture results  may not be optimal due to an inadequate volume of blood received in culture bottles   Culture  Setup Time   Final    GRAM POSITIVE COCCI IN CLUSTERS AEROBIC BOTTLE ONLY CRITICAL VALUE NOTED.  VALUE IS CONSISTENT WITH PREVIOUSLY REPORTED AND CALLED VALUE. Performed at [facility], [place] [address] [place], [place] [address]    Culture STAPHYLOCOCCUS HOMINIS (A)  Final   Report Status 04/18/2020 FINAL  Final   Organism ID, Bacteria STAPHYLOCOCCUS HOMINIS  Final      Susceptibility   Staphylococcus hominis - MIC*    CIPROFLOXACIN >=8 RESISTANT Resistant     ERYTHROMYCIN >=8 RESISTANT Resistant     GENTAMICIN <=0.5 SENSITIVE Sensitive     OXACILLIN >=4 RESISTANT Resistant     TETRACYCLINE >=16 RESISTANT Resistant     VANCOMYCIN <=0.5 SENSITIVE Sensitive     TRIMETH/SULFA 80 RESISTANT Resistant     CLINDAMYCIN RESISTANT Resistant     RIFAMPIN <=0.5 SENSITIVE Sensitive     Inducible Clindamycin POSITIVE Resistant     LINEZOLID Value in next row Sensitive      SENSITIVE2    * STAPHYLOCOCCUS HOMINIS  Culture, blood (routine x 2)     Status: None   Collection Time: 04/15/20  6:45 PM   Specimen: BLOOD  Result Value Ref Range Status   Specimen Description BLOOD SITE NOT SPECIFIED  Final   Special Requests   Final    BOTTLES DRAWN AEROBIC AND ANAEROBIC Blood Culture adequate volume   Culture   Final    NO GROWTH 5 DAYS Performed at [facility], [address] [place], [place] [address]    Report Status 04/20/2020 FINAL  Final  Culture, blood (routine x 2)     Status: None   Collection Time: 04/19/20  1:03 PM   Specimen: BLOOD  Result Value Ref Range Status   Specimen Description BLOOD LEFT ANTECUBITAL  Final   Special Requests   Final    BOTTLES DRAWN AEROBIC ONLY Blood Culture results may not be optimal due to an inadequate volume of blood received in culture bottles   Culture   Final    NO GROWTH 5 DAYS Performed at [facility], [place] [address],  [place], [place] [address]    Report Status 04/24/2020 FINAL  Final  Culture, blood (routine x 2)     Status: None   Collection Time: 04/19/20  6:26 PM   Specimen: BLOOD  Result Value Ref Range Status   Specimen Description BLOOD LEFT ANTECUBITAL  Final   Special Requests  Final    BOTTLES DRAWN AEROBIC AND ANAEROBIC Blood Culture results may not be optimal due to an excessive volume of blood received in culture bottles   Culture   Final    NO GROWTH 5 DAYS Performed at [facility] [address] [place], [place] [address]    Report Status 04/24/2020 FINAL  Final    Radiology Reports CT Head Wo Contrast  Result Date: 04/13/2020 CLINICAL DATA:  79 year old female with head trauma. EXAM: CT HEAD WITHOUT CONTRAST CT CERVICAL SPINE WITHOUT CONTRAST TECHNIQUE: Multidetector CT imaging of the head and cervical spine was performed following the standard protocol without intravenous contrast. Multiplanar CT image reconstructions of the cervical spine were also generated. COMPARISON:  CT dated 08/28/2019. FINDINGS: CT HEAD FINDINGS Brain: Mild age-related atrophy and chronic microvascular ischemic changes. There is no acute intracranial hemorrhage. No mass effect or midline shift. No extra-axial fluid collection. Vascular: No hyperdense vessel or unexpected calcification. Skull: Normal. Negative for fracture or focal lesion. Sinuses/Orbits: No acute finding. Other: None CT CERVICAL SPINE FINDINGS Alignment: No acute subluxation. Skull base and vertebrae: No acute fracture. Osteopenia. Soft tissues and spinal canal: No prevertebral fluid or swelling. No visible canal hematoma. Disc levels:  Degenerative changes. No acute findings. Upper chest: Small left upper lobe calcified granuloma. Partially visualized right subclavian central venous line. Other: Bilateral carotid bulb calcified plaques. IMPRESSION: 1. No acute intracranial pathology. Mild age-related atrophy and chronic microvascular ischemic  changes. 2. No acute/traumatic cervical spine pathology. Electronically Signed   By: Anner Crete M.D.   On: 04/13/2020 20:44   CT CHEST WO CONTRAST  Result Date: 04/14/2020 CLINICAL DATA:  Respiratory illness EXAM: CT CHEST WITHOUT CONTRAST TECHNIQUE: Multidetector CT imaging of the chest was performed following the standard protocol without IV contrast. COMPARISON:  Chest x-ray 04/13/2020.  CT 04/11/2020 FINDINGS: Cardiovascular: Aortic atherosclerosis. Scattered coronary artery calcifications. Heart is normal size and aorta is normal caliber. Mediastinum/Nodes: Small scattered non pathologically enlarged mediastinal lymph nodes, stable. No mediastinal, hilar, or axillary adenopathy. Trachea and esophagus are unremarkable. Thyroid unremarkable. Lungs/Pleura: Peripheral interstitial prominence in thickening compatible with fibrosis, stable. Rounded partially necrotic mass in the posterior right lower lobe measures 3.1 cm and is slightly decreased in size since prior study when this measured 3.8 cm. No effusions. No confluent opacities on the left. Calcified granuloma in the left upper lobe. Upper Abdomen: Imaging into the upper abdomen demonstrates no acute findings. Musculoskeletal: Chest wall soft tissues are unremarkable. Right upper chest wall Port-A-Cath remains in place, unchanged. No acute bony abnormality. Old posterior right 7th rib fracture and upper thoracic compression fractures, unchanged. IMPRESSION: Partially necrotic mass in the posterior right lower lobe is again noted, slightly decreased in size since prior study, 3.1 cm currently compared to 3.8 cm previously. Changes of fibrosis throughout the lungs. Coronary artery disease. Aortic Atherosclerosis (ICD10-I70.0). Electronically Signed   By: Rolm Baptise M.D.   On: 04/14/2020 18:33   CT Cervical Spine Wo Contrast  Result Date: 04/13/2020 CLINICAL DATA:  79 year old female with head trauma. EXAM: CT HEAD WITHOUT CONTRAST CT CERVICAL  SPINE WITHOUT CONTRAST TECHNIQUE: Multidetector CT imaging of the head and cervical spine was performed following the standard protocol without intravenous contrast. Multiplanar CT image reconstructions of the cervical spine were also generated. COMPARISON:  CT dated 08/28/2019. FINDINGS: CT HEAD FINDINGS Brain: Mild age-related atrophy and chronic microvascular ischemic changes. There is no acute intracranial hemorrhage. No mass effect or midline shift. No extra-axial fluid collection. Vascular:  No hyperdense vessel or unexpected calcification. Skull: Normal. Negative for fracture or focal lesion. Sinuses/Orbits: No acute finding. Other: None CT CERVICAL SPINE FINDINGS Alignment: No acute subluxation. Skull base and vertebrae: No acute fracture. Osteopenia. Soft tissues and spinal canal: No prevertebral fluid or swelling. No visible canal hematoma. Disc levels:  Degenerative changes. No acute findings. Upper chest: Small left upper lobe calcified granuloma. Partially visualized right subclavian central venous line. Other: Bilateral carotid bulb calcified plaques. IMPRESSION: 1. No acute intracranial pathology. Mild age-related atrophy and chronic microvascular ischemic changes. 2. No acute/traumatic cervical spine pathology. Electronically Signed   By: Anner Crete M.D.   On: 04/13/2020 20:44   MR BRAIN WO CONTRAST  Result Date: 04/17/2020 CLINICAL DATA:  Coronavirus infection. Altered mental status. History of squamous cell carcinoma of the lung. EXAM: MRI HEAD WITHOUT CONTRAST TECHNIQUE: Multiplanar, multiecho pulse sequences of the brain and surrounding structures were obtained without intravenous contrast. COMPARISON:  Head CT 04/13/2020.  MRI 03/04/2020. FINDINGS: Brain: Diffusion imaging does not show any acute or subacute infarction or other cause of restricted diffusion. No abnormality affects the brainstem or cerebellum. Cerebral hemispheres show age related atrophy with mild chronic small-vessel  ischemic changes of the white matter, often seen at this age. No cortical or large vessel territory infarction. No mass lesion, hemorrhage, hydrocephalus or extra-axial collection. Vascular: Major vessels at the base of the brain show flow. Skull and upper cervical spine: Negative Sinuses/Orbits: Sinuses are clear except for a small amount of fluid layering in the left division of the sphenoid sinus, not likely significant. Orbits negative. Previous lens implant on right Other: None IMPRESSION: No acute finding. Age related atrophy. 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 Electronically Signed   By: Nelson Chimes M.D.   On: 04/17/2020 10:50   DG Pelvis Portable  Result Date: 04/13/2020 CLINICAL DATA:  Fall EXAM: PORTABLE PELVIS 1-2 VIEWS COMPARISON:  None. FINDINGS: Prior right hip replacement. Normal AP alignment. Mild degenerative changes within the left hip. SI joints symmetric and unremarkable. No acute bony abnormality. Specifically, no fracture, subluxation, or dislocation. IMPRESSION: No acute bony abnormality. Electronically Signed   By: Rolm Baptise M.D.   On: 04/13/2020 19:21   DG Chest Port 1 View  Result Date: 04/13/2020 CLINICAL DATA:  Fall, COVID EXAM: PORTABLE CHEST 1 VIEW COMPARISON:  04/11/2020 FINDINGS: Right Port-A-Cath remains in place, unchanged. Heart is normal size. Low lung volumes. Diffuse interstitial prominence throughout the lungs compatible with chronic lung disease/fibrosis. No definite acute confluent airspace opacity. Previously seen rounded mass posteriorly in the right lung better seen on prior CT. No effusions. IMPRESSION: Stable appearance of the chest. Electronically Signed   By: Rolm Baptise M.D.   On: 04/13/2020 19:21   DG Ankle Right Port  Result Date: 04/17/2020 CLINICAL DATA:  79 year old female with soft tissue swelling of the lateral malleolus. No known injury. EXAM: PORTABLE RIGHT ANKLE - 2 VIEW; RIGHT FOOT - 2  VIEW COMPARISON:  None. FINDINGS: There is no acute fracture or dislocation. The bones are osteopenic. The ankle mortise is intact. The soft tissues are unremarkable. IMPRESSION: No acute fracture or dislocation. Electronically Signed   By: Anner Crete M.D.   On: 04/17/2020 21:16   DG Foot 2 Views Right  Result Date: 04/17/2020 CLINICAL DATA:  79 year old female with soft tissue swelling of the lateral malleolus. No known injury. EXAM: PORTABLE RIGHT ANKLE - 2 VIEW; RIGHT FOOT - 2 VIEW COMPARISON:  None. FINDINGS: There is no acute fracture or dislocation. The bones are osteopenic. The ankle mortise is intact. The soft tissues are unremarkable. IMPRESSION: No acute fracture or dislocation. Electronically Signed   By: Anner Crete M.D.   On: 04/17/2020 21:16   EEG adult  Result Date: 04/16/2020 Lora Havens, MD     04/16/2020  1:37 PM Patient Name: REATHEL TURI MRN: [id number] Epilepsy Attending: Lora Havens Referring Physician/Provider: Dr Oren Binet Date: 04/16/2020 Duration: 25.12 mins Patient history: 79yo F with ams. EEG to evaluate for seizure. Level of alertness: Awake AEDs during EEG study: xanax, pregabalin Technical aspects: This EEG study was done with scalp electrodes positioned according to the 10-20 International system of electrode placement. Electrical activity was acquired at a sampling rate of 500Hz  and reviewed with a high frequency filter of 70Hz  and a low frequency filter of 1Hz . EEG data were recorded continuously and digitally stored. Description: The posterior dominant rhythm consists of 8 Hz activity of moderate voltage (25-35 uV) seen predominantly in posterior head regions, symmetric and reactive to eye opening and eye closing.  EEG showed intermittent generalized 3 to 6 Hz theta-delta slowing. Triphasic waves, generalized were also noted. Hyperventilation and photic stimulation were not performed.   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 No seizures or epileptiform discharges were seen throughout the recording. [place]   DG HIP UNILAT WITH PELVIS 2-3 VIEWS RIGHT  Result Date: 04/18/2020 CLINICAL DATA:  Acute onset right hip pain. No known injury. History of prior right hip replacement. EXAM: DG HIP (WITH OR WITHOUT PELVIS) 2-3V RIGHT COMPARISON:  Plain film of the pelvis 04/13/2020. PET CT scan 01/25/2020. FINDINGS: The first image which is the AP view is over penetrated. Right hip arthroplasty is in place. No hardware complication. No fracture, dislocation or focal bony lesion. Soft tissues are negative. IMPRESSION: No acute abnormality.  Status post right hip replacement. Electronically Signed   By: Inge Rise M.D.   On: 04/18/2020 15:03

## 2020-04-25 NOTE — Progress Notes (Signed)
[facility] Liaison note.    Received request from [place] for family interest in [place].  Hospice Home notified of request and patient is eligible.   Spoke to [name] and she states as soon as bed is ready she is willing to sign and complete consents needed by [facility]. A representative will follow up with [facility] manager and family. Once consents are completed TOC and RN will be notified to call [facility] for transport. And RN will call report to [phone].   Please do not hesitate to call with questions.        Thank you,    Clementeen Hoof, RN, CCM     [place] (listed on [facility] under [facility] [place] [place])   [phone]

## 2020-04-25 NOTE — Plan of Care (Signed)
Patient is currently resting in bed. Turns self. Given sips of water overnight. Incont of urine. Tolerating meds, sip out morphine once throughout the night. Call bell within reach. Bed alarm on.   Problem: Education: Goal: Knowledge of the prescribed therapeutic regimen will improve Outcome: Progressing   Problem: Coping: Goal: Ability to identify and develop effective coping behavior will improve Outcome: Progressing   Problem: Clinical Measurements: Goal: Quality of life will improve Outcome: Progressing   Problem: Respiratory: Goal: Verbalizations of increased ease of respirations will increase Outcome: Progressing   Problem: Role Relationship: Goal: Family's ability to cope with current situation will improve Outcome: Progressing Goal: Ability to verbalize concerns, feelings, and thoughts to partner or family member will improve Outcome: Progressing   Problem: Pain Management: Goal: Satisfaction with pain management regimen will improve Outcome: Progressing

## 2020-04-26 ENCOUNTER — Ambulatory Visit: Payer: Medicare Other

## 2020-04-26 ENCOUNTER — Inpatient Hospital Stay: Payer: Medicare Other

## 2020-04-26 DIAGNOSIS — J9611 Chronic respiratory failure with hypoxia: Secondary | ICD-10-CM | POA: Diagnosis not present

## 2020-04-26 DIAGNOSIS — J441 Chronic obstructive pulmonary disease with (acute) exacerbation: Secondary | ICD-10-CM | POA: Diagnosis not present

## 2020-04-26 DIAGNOSIS — W19XXXA Unspecified fall, initial encounter: Secondary | ICD-10-CM | POA: Diagnosis not present

## 2020-04-26 DIAGNOSIS — G9341 Metabolic encephalopathy: Secondary | ICD-10-CM | POA: Diagnosis not present

## 2020-04-26 MED ORDER — MORPHINE SULFATE (CONCENTRATE) 10 MG/0.5ML PO SOLN: 10.0000 mg | Freq: Four times a day (QID) | ORAL | Status: AC

## 2020-04-26 MED ORDER — HALOPERIDOL LACTATE 2 MG/ML PO CONC: 2.0000 mg | ORAL | 0 refills | Status: AC | PRN

## 2020-04-26 MED ORDER — CLONAZEPAM 1 MG PO TBDP: 1.0000 mg | ORAL_TABLET | Freq: Three times a day (TID) | ORAL | 0 refills | Status: AC

## 2020-04-26 NOTE — TOC Transition Note (Signed)
Transition of Care [facility]) - CM/SW Discharge Note   Patient Details  Name: [name] MRN: [id number] Date of Birth: 30-Dec-1940  [facility] CM/SW Contact:  Benard Halsted, LCSW Phone Number: 04/26/2020, 10:03 AM   Clinical Narrative:    Patient will DC to: [facility] Anticipated DC date: 04/26/20 Family notified: Daughter, Glass blower/designer by: Corey Harold   Per MD patient ready for DC to . RN to call report prior to discharge [phone] ). RN, patient, patient's family, and facility notified of DC. Discharge Summary and FL2 sent to facility. DC packet on chart. Ambulance transport requested for patient.   CSW will sign off for now as social work intervention is no longer needed. Please consult [place] again if new needs arise.      Final next level of care: [place] Barriers to Discharge: Barriers Resolved   Patient Goals and CMS Choice Patient states their goals for this hospitalization and ongoing recovery are:: Return to baseline CMS Medicare.gov Compare Post Acute Care list provided to:: Patient Choice offered to / list presented to : Patient  Discharge Placement                Patient to be transferred to facility by: [place] Name of family member notified: Daughter Patient and family notified of of transfer: 04/26/20  Discharge Plan and Services In-house Referral: Clinical Social Work Discharge Planning Services: CM Consult Post Acute Care Choice: Home Health                    HH Arranged: RN, PT, OT [facility] Agency: [place] Date [place]: 04/17/20 Time [place]: [id number] Representative spoke with at [place]: [place] ([place]) Interventions     Readmission Risk Interventions No flowsheet data found.

## 2020-04-26 NOTE — Plan of Care (Signed)
Patient has been resting throughout the night. Poor PO intake. Tolerating liquids. Voiding, purewick in place. Call bell within reach. Bed alarm on.   Problem: Education: Goal: Knowledge of the prescribed therapeutic regimen will improve Outcome: Progressing   Problem: Coping: Goal: Ability to identify and develop effective coping behavior will improve Outcome: Progressing   Problem: Clinical Measurements: Goal: Quality of life will improve Outcome: Progressing   Problem: Respiratory: Goal: Verbalizations of increased ease of respirations will increase Outcome: Progressing   Problem: Role Relationship: Goal: Family's ability to cope with current situation will improve Outcome: Progressing Goal: Ability to verbalize concerns, feelings, and thoughts to partner or family member will improve Outcome: Progressing   Problem: Pain Management: Goal: Satisfaction with pain management regimen will improve Outcome: Progressing

## 2020-04-26 NOTE — Discharge Summary (Signed)
PATIENT DETAILS Name: [name] Age: 79 y.o. Sex: female Date of Birth: 1940/10/15 MRN: [id number]. Admitting Physician: Vernelle Emerald, MD FOY:DXAJOIN, Dalbert Batman, FNP  Admit Date: 04/13/2020 Discharge date: 04/26/2020  Recommendations for Outpatient Follow-up:  1. Optimize comfort care.  Admitted From:  Home  Disposition: [place]: No  Equipment/Devices: None  Discharge Condition: Hospice/poor  CODE STATUS: DNR  Diet recommendation:  Diet Order            Diet general           Diet regular Room service appropriate? Yes; Fluid consistency: Thin  Diet effective now                  Brief Narrative: Patient is a 79 y.o. 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 Hospital course complicated by lingering/waxing/waning encephalopathy-and severe failure to thrive syndrome with very poor oral intake.  Palliative care consulted-after extensive discussion with patient's daughter-she has been transitioned to full comfort measures on 11/28.   COVID-19 vaccinated status: Unvaccinated  Significant Events: 11/11>> diagnosed with COVID-19 (per chart review) 11/18>> Admit to [facility] for weakness/confusion/COVID-19 pneumonia  Significant studies: 11/18>> chest x-ray: Diffuse interstitial prominence throughout both lungs compatible with chronic lung disease/fibrosis-no definite airspace opacity 11/18>> pelvic x-ray: No acute bony abnormality. 11/18>> CT head/C-spine: No acute abnormality in head/no fractures in C-spine 11/19>> CT chest: Partially necrotic mass in the right lower lobe-fibrosis throughout both lungs.  CAD. 11/21>> EEG - This study is suggestive of mild diffuse  encephalopathy, nonspecific etiology but likely related to toxic-metabolic etiology, anoxic/hypoxic brain injury.No seizures or epileptiform discharges were seen throughout the recording. 11/22 >> MRI brain: No acute findings.  COVID-19 medications: Steroids: 11/18>>11/25 Remdesivir: 11/18>>11/22 Monoclonal antibody: 11/16 x 1  Antibiotics: Vancomycin: 11/21>> 11/22 Rocephin: 11/22>> 11/24 Zyvox: 11/23>>  Microbiology data: 11/24>> blood culture: No growth. 11/20>> blood cultures: Staph hominis (1/2) 11/18 >>blood culture: Staph hominis. 11/18>> urine culture: <10,007 colonies/mL-insignificant growth  Procedures: None  Consults: Palliative care  Brief Hospital Course: Acute on chronic hypoxic Resp Failure due to Covid 19 Viral pneumonia: Improved-stable on 2 L-which is her home regimen although she mostly uses at night.  Suspect has some significant amount of radiation-induced fibrosis and a CT chest.  Has completed a course of Remdesivir and steroids.  COPD exacerbation: Resolved-moving air well-completed steroids 11/25  Staph hominis bacteremia: Culture results as above-discussed with Dr. Carolyne Fiscal on-call on 11/25-chart reviewed over the phone-recommends total of 7 days of Zyvox from 11/23.  Unfortunately-patient refusing Zyvox-and is spitting out medications-no longer on any antimicrobial therapy..  Acute metabolic encephalopathy: Most likely COVID-19 inflammation related encephalopathy, CT, MRI and EEG all nonacute.  No focal deficits.  Continues to have waxing and waning encephalopathy-now with severe failure to thrive syndrome.  Currently under comfort measures-surprisingly-after some comfort medications on 11/28 she briefly became alert-but mentation continues to wax and wane.  Fall: Secondary debility/deconditioning at baseline-superimposed weakness due to COVID-19 infection.   Anxiety/depression: All psychotropic medications were held to see if this improves  her mentation-however no significant improvement-these medications were resumed on 11/27-however patient continues to refuse and spit out medications-hence psychotropic medications remain on hold.  Currently symptoms well controlled with scheduled benzos/narcotics and comfort measures.  Hypernatremia: Secondary to poor oral intake-severe failure to thrive syndrome-underlying malignancy and COVID-19 infection.  Oral intake continues to be very poor-occasionally she will consume sips of liquids.  A. fib with RVR: Developed overnight on 11/27-started on Cardizem infusion.  Since transition to comfort measures-no longer needs rate control medications and on telemetry monitoring.    Chronic pain syndrome-opiate dependent: Given encephalopathy-all narcotics were held initially-however since goals of care are mostly for comfort-have started narcotics.  Squamous cell carcinoma of the lung-getting radiation treatments-resume outpatient follow-up with radiation oncology/medical oncology-could have some amount of fibrosis/inflammation in the lungs due to radiation.  On steroids.  Per daughter-patient on radiation treatment as was deemed to be a poor candidate for chemotherapy due to overall medical comorbidities/functional status.  Normocytic anemia:  History of VTE: Initially on oral anticoagulation-due to her not taking oral medications-this was changed to SQ Lovenox.  Since patient transitioned to comfort measures-all anticoagulation discontinued.  HTN: BP high, unable to take oral medications consistently-Nitropaste was added-since transitioned to comfort measures-no antihypertensives required.    HLD  GERD  Goals of care: DNR in place-admitted with COVID-19 infection/possible pneumonia-in a background of failure to thrive syndrome/squamous cell carcinoma of the lung.  Although her respiratory status stabilized-patient continued to deteriorate-with very poor oral intake-severe waxing and waning  encephalopathy and severe failure to thrive syndrome.  She also developed hyponatremia and A. fib with RVR.  Palliative care following.  Patient subsequently transitioned to comfort measures-palliative care following-Dr. Katherine Mantle with daughter on 11/29-with recommendations to proceed with residential hospice on discharge.     Discharge Diagnoses:  Principal Problem:   Acute metabolic encephalopathy Active Problems:   COVID-19 virus infection   COPD with acute exacerbation (HCC)   History of pulmonary embolism   Chronic respiratory failure with hypoxia (HCC)   Squamous cell carcinoma of lung, stage III, right (HCC)   GERD (gastroesophageal reflux disease)   Fall at home, initial encounter   Anemia   Adult failure to thrive   Palliative care by specialist   Discharge Instructions:    Person Under Monitoring Name: [name]  Location: [address] [place] [address]   Infection Prevention Recommendations for Individuals Confirmed to have, or Being Evaluated for, 2019 Novel Coronavirus (COVID-19) Infection Who Receive Care at Home  Individuals who are confirmed to have, or are being evaluated for, COVID-19 should follow the prevention steps below until a healthcare provider or local or state health department says they can return to normal activities.  Stay home except to get medical care You should restrict activities outside your home, except for getting medical care. Do not go to work, school, or public areas, and do not use public transportation or taxis.  Call ahead before visiting your doctor Before your medical appointment, call the healthcare provider and tell them that you have, or are being evaluated for, COVID-19 infection. This will help the healthcare provider's office take steps to keep other people from getting infected. Ask your healthcare provider to call the local or state health department.  Monitor your symptoms Seek prompt  medical attention if your illness is worsening (e.g., difficulty breathing). Before going to your medical appointment, call the healthcare provider and tell them that you have, or are being evaluated for, COVID-19 infection. Ask your healthcare provider to call the local or state health department.  Wear a facemask You should wear a facemask that covers your nose and mouth when you are in the same room with  other people and when you visit a healthcare provider. People who live with or visit you should also wear a facemask while they are in the same room with you.  Separate yourself from other people in your home As much as possible, you should stay in a different room from other people in your home. Also, you should use a separate bathroom, if available.  Avoid sharing household items You should not share dishes, drinking glasses, cups, eating utensils, towels, bedding, or other items with other people in your home. After using these items, you should wash them thoroughly with soap and water.  Cover your coughs and sneezes Cover your mouth and nose with a tissue when you cough or sneeze, or you can cough or sneeze into your sleeve. Throw used tissues in a lined trash can, and immediately wash your hands with soap and water for at least 20 seconds or use an alcohol-based hand rub.  Wash your [facility] your hands often and thoroughly with soap and water for at least 20 seconds. You can use an alcohol-based hand sanitizer if soap and water are not available and if your hands are not visibly dirty. Avoid touching your eyes, nose, and mouth with unwashed hands.   Prevention Steps for Caregivers and Household Members of Individuals Confirmed to have, or Being Evaluated for, COVID-19 Infection Being Cared for in the Home  If you live with, or provide care at home for, a person confirmed to have, or being evaluated for, COVID-19 infection please follow these guidelines to prevent  infection:  Follow healthcare provider's instructions Make sure that you understand and can help the patient follow any healthcare provider instructions for all care.  Provide for the patient's basic needs You should help the patient with basic needs in the home and provide support for getting groceries, prescriptions, and other personal needs.  Monitor the patient's symptoms If they are getting sicker, call his or her medical provider and tell them that the patient has, or is being evaluated for, COVID-19 infection. This will help the healthcare provider's office take steps to keep other people from getting infected. Ask the healthcare provider to call the local or state health department.  Limit the number of people who have contact with the patient  If possible, have only one caregiver for the patient.  Other household members should stay in another home or place of residence. If this is not possible, they should stay  in another room, or be separated from the patient as much as possible. Use a separate bathroom, if available.  Restrict visitors who do not have an essential need to be in the home.  Keep older adults, very young children, and other sick people away from the patient Keep older adults, very young children, and those who have compromised immune systems or chronic health conditions away from the patient. This includes people with chronic heart, lung, or kidney conditions, diabetes, and cancer.  Ensure good ventilation Make sure that shared spaces in the home have good air flow, such as from an air conditioner or an opened window, weather permitting.  Wash your hands often  Wash your hands often and thoroughly with soap and water for at least 20 seconds. You can use an alcohol based hand sanitizer if soap and water are not available and if your hands are not visibly dirty.  Avoid touching your eyes, nose, and mouth with unwashed hands.  Use disposable paper towels  to dry your hands. If not available,  use dedicated cloth towels and replace them when they become wet.  Wear a facemask and gloves  Wear a disposable facemask at all times in the room and gloves when you touch or have contact with the patient's blood, body fluids, and/or secretions or excretions, such as sweat, saliva, sputum, nasal mucus, vomit, urine, or feces.  Ensure the mask fits over your nose and mouth tightly, and do not touch it during use.  Throw out disposable facemasks and gloves after using them. Do not reuse.  Wash your hands immediately after removing your facemask and gloves.  If your personal clothing becomes contaminated, carefully remove clothing and launder. Wash your hands after handling contaminated clothing.  Place all used disposable facemasks, gloves, and other waste in a lined container before disposing them with other household waste.  Remove gloves and wash your hands immediately after handling these items.  Do not share dishes, glasses, or other household items with the patient  Avoid sharing household items. You should not share dishes, drinking glasses, cups, eating utensils, towels, bedding, or other items with a patient who is confirmed to have, or being evaluated for, COVID-19 infection.  After the person uses these items, you should wash them thoroughly with soap and water.  Wash laundry thoroughly  Immediately remove and wash clothes or bedding that have blood, body fluids, and/or secretions or excretions, such as sweat, saliva, sputum, nasal mucus, vomit, urine, or feces, on them.  Wear gloves when handling laundry from the patient.  Read and follow directions on labels of laundry or clothing items and detergent. In general, wash and dry with the warmest temperatures recommended on the label.  Clean all areas the individual has used often  Clean all touchable surfaces, such as counters, tabletops, doorknobs, bathroom fixtures, toilets, phones,  keyboards, tablets, and bedside tables, every day. Also, clean any surfaces that may have blood, body fluids, and/or secretions or excretions on them.  Wear gloves when cleaning surfaces the patient has come in contact with.  Use a diluted bleach solution (e.g., dilute bleach with 1 part bleach and 10 parts water) or a household disinfectant with a label that says EPA-registered for coronaviruses. To make a bleach solution at home, add 1 tablespoon of bleach to 1 quart (4 cups) of water. For a larger supply, add  cup of bleach to 1 gallon (16 cups) of water.  Read labels of cleaning products and follow recommendations provided on product labels. Labels contain instructions for safe and effective use of the cleaning product including precautions you should take when applying the product, such as wearing gloves or eye protection and making sure you have good ventilation during use of the product.  Remove gloves and wash hands immediately after cleaning.  Monitor yourself for signs and symptoms of illness Caregivers and household members are considered close contacts, should monitor their health, and will be asked to limit movement outside of the home to the extent possible. Follow the monitoring steps for close contacts listed on the symptom monitoring form.   ? If you have additional questions, contact your local health department or call the epidemiologist on call at [phone] (available 24/7). ? This guidance is subject to change. For the most up-to-date guidance from CDC, please refer to their website: YouBlogs.pl    Activity:  As tolerated  Discharge Instructions    Diet general   Complete by: As directed    Increase activity slowly   Complete by: As directed  Allergies as of 04/26/2020      Reactions   Ampicillin Itching, Nausea And Vomiting   Other    Muscle relaxer   Sulfa Antibiotics Itching, Nausea And  Vomiting   Toradol [ketorolac Tromethamine] Itching, Nausea And Vomiting   Tramadol Itching, Nausea And Vomiting      Medication List    STOP taking these medications   acetaminophen 500 MG tablet Commonly known as: TYLENOL   albuterol 1.25 MG/3ML nebulizer solution Commonly known as: ACCUNEB   albuterol 108 (90 Base) MCG/ACT inhaler Commonly known as: VENTOLIN HFA   alendronate 70 MG tablet Commonly known as: FOSAMAX   ALPRAZolam 1 MG tablet Commonly known as: XANAX   amLODipine 5 MG tablet Commonly known as: NORVASC   bisacodyl 5 MG EC tablet Commonly known as: DULCOLAX   diphenhydrAMINE 25 mg capsule Commonly known as: BENADRYL   docusate sodium 100 MG capsule Commonly known as: COLACE   DULoxetine 60 MG capsule Commonly known as: CYMBALTA   Eliquis 2.5 MG Tabs tablet Generic drug: apixaban   esomeprazole 40 MG capsule Commonly known as: NEXIUM   fentaNYL 50 MCG/HR Commonly known as: DURAGESIC   ferrous sulfate 325 (65 FE) MG tablet   Linzess 290 MCG Caps capsule Generic drug: linaclotide   lovastatin 10 MG tablet Commonly known as: MEVACOR   ondansetron 4 MG tablet Commonly known as: ZOFRAN   oxyCODONE 10 mg 12 hr tablet Commonly known as: OXYCONTIN   polyethylene glycol 17 g packet Commonly known as: MIRALAX / GLYCOLAX   potassium chloride 10 MEQ tablet Commonly known as: KLOR-CON   pregabalin 100 MG capsule Commonly known as: LYRICA   prochlorperazine 10 MG tablet Commonly known as: COMPAZINE   promethazine 12.5 MG tablet Commonly known as: PHENERGAN   traZODone 50 MG tablet Commonly known as: DESYREL   vitamin B-12 500 MCG tablet Commonly known as: CYANOCOBALAMIN     TAKE these medications   clonazePAM 1 MG disintegrating tablet Commonly known as: KLONOPIN Take 1 tablet (1 mg total) by mouth 3 (three) times daily.   haloperidol 2 MG/ML solution Commonly known as: HALDOL Place 1 mL (2 mg total) under the tongue every 4  (four) hours as needed for agitation (or delirium).   morphine CONCENTRATE 10 MG/0.5ML Soln concentrated solution Take 0.5 mLs (10 mg total) by mouth every 6 (six) hours.       Follow-up Information    [name], FNP .   Specialties: Nurse Practitioner, Family Medicine Contact information: [address] [place] [address] [phone]              Allergies  Allergen Reactions  . Ampicillin Itching and Nausea And Vomiting  . Other     Muscle relaxer  . Sulfa Antibiotics Itching and Nausea And Vomiting  . Toradol [Ketorolac Tromethamine] Itching and Nausea And Vomiting  . Tramadol Itching and Nausea And Vomiting    Other Procedures/Studies: CT Head Wo Contrast  Result Date: 04/13/2020 CLINICAL DATA:  79 year old female with head trauma. EXAM: CT HEAD WITHOUT CONTRAST CT CERVICAL SPINE WITHOUT CONTRAST TECHNIQUE: Multidetector CT imaging of the head and cervical spine was performed following the standard protocol without intravenous contrast. Multiplanar CT image reconstructions of the cervical spine were also generated. COMPARISON:  CT dated 08/28/2019. FINDINGS: CT HEAD FINDINGS Brain: Mild age-related atrophy and chronic microvascular ischemic changes. There is no acute intracranial hemorrhage. No mass effect or midline shift. No extra-axial fluid collection. Vascular: No hyperdense vessel  or unexpected calcification. Skull: Normal. Negative for fracture or focal lesion. Sinuses/Orbits: No acute finding. Other: None CT CERVICAL SPINE FINDINGS Alignment: No acute subluxation. Skull base and vertebrae: No acute fracture. Osteopenia. Soft tissues and spinal canal: No prevertebral fluid or swelling. No visible canal hematoma. Disc levels:  Degenerative changes. No acute findings. Upper chest: Small left upper lobe calcified granuloma. Partially visualized right subclavian central venous line. Other: Bilateral carotid bulb calcified plaques. IMPRESSION: 1. No acute  intracranial pathology. Mild age-related atrophy and chronic microvascular ischemic changes. 2. No acute/traumatic cervical spine pathology. Electronically Signed   By: Anner Crete M.D.   On: 04/13/2020 20:44   CT CHEST WO CONTRAST  Result Date: 04/14/2020 CLINICAL DATA:  Respiratory illness EXAM: CT CHEST WITHOUT CONTRAST TECHNIQUE: Multidetector CT imaging of the chest was performed following the standard protocol without IV contrast. COMPARISON:  Chest x-ray 04/13/2020.  CT 04/11/2020 FINDINGS: Cardiovascular: Aortic atherosclerosis. Scattered coronary artery calcifications. Heart is normal size and aorta is normal caliber. Mediastinum/Nodes: Small scattered non pathologically enlarged mediastinal lymph nodes, stable. No mediastinal, hilar, or axillary adenopathy. Trachea and esophagus are unremarkable. Thyroid unremarkable. Lungs/Pleura: Peripheral interstitial prominence in thickening compatible with fibrosis, stable. Rounded partially necrotic mass in the posterior right lower lobe measures 3.1 cm and is slightly decreased in size since prior study when this measured 3.8 cm. No effusions. No confluent opacities on the left. Calcified granuloma in the left upper lobe. Upper Abdomen: Imaging into the upper abdomen demonstrates no acute findings. Musculoskeletal: Chest wall soft tissues are unremarkable. Right upper chest wall Port-A-Cath remains in place, unchanged. No acute bony abnormality. Old posterior right 7th rib fracture and upper thoracic compression fractures, unchanged. IMPRESSION: Partially necrotic mass in the posterior right lower lobe is again noted, slightly decreased in size since prior study, 3.1 cm currently compared to 3.8 cm previously. Changes of fibrosis throughout the lungs. Coronary artery disease. Aortic Atherosclerosis (ICD10-I70.0). Electronically Signed   By: Rolm Baptise M.D.   On: 04/14/2020 18:33   CT Cervical Spine Wo Contrast  Result Date: 04/13/2020 CLINICAL  DATA:  79 year old female with head trauma. EXAM: CT HEAD WITHOUT CONTRAST CT CERVICAL SPINE WITHOUT CONTRAST TECHNIQUE: Multidetector CT imaging of the head and cervical spine was performed following the standard protocol without intravenous contrast. Multiplanar CT image reconstructions of the cervical spine were also generated. COMPARISON:  CT dated 08/28/2019. FINDINGS: CT HEAD FINDINGS Brain: Mild age-related atrophy and chronic microvascular ischemic changes. There is no acute intracranial hemorrhage. No mass effect or midline shift. No extra-axial fluid collection. Vascular: No hyperdense vessel or unexpected calcification. Skull: Normal. Negative for fracture or focal lesion. Sinuses/Orbits: No acute finding. Other: None CT CERVICAL SPINE FINDINGS Alignment: No acute subluxation. Skull base and vertebrae: No acute fracture. Osteopenia. Soft tissues and spinal canal: No prevertebral fluid or swelling. No visible canal hematoma. Disc levels:  Degenerative changes. No acute findings. Upper chest: Small left upper lobe calcified granuloma. Partially visualized right subclavian central venous line. Other: Bilateral carotid bulb calcified plaques. IMPRESSION: 1. No acute intracranial pathology. Mild age-related atrophy and chronic microvascular ischemic changes. 2. No acute/traumatic cervical spine pathology. Electronically Signed   By: Anner Crete M.D.   On: 04/13/2020 20:44   MR BRAIN WO CONTRAST  Result Date: 04/17/2020 CLINICAL DATA:  Coronavirus infection. Altered mental status. History of squamous cell carcinoma of the lung. EXAM: MRI HEAD WITHOUT CONTRAST TECHNIQUE: Multiplanar, multiecho pulse sequences of the brain and surrounding structures were obtained without intravenous contrast. COMPARISON:  Head CT 04/13/2020.  MRI 03/04/2020. FINDINGS: Brain: Diffusion imaging does not show any acute or subacute infarction or other cause of restricted diffusion. No abnormality affects the brainstem or  cerebellum. Cerebral hemispheres show age related atrophy with mild chronic small-vessel ischemic changes of the white matter, often seen at this age. No cortical or large vessel territory infarction. No mass lesion, hemorrhage, hydrocephalus or extra-axial collection. Vascular: Major vessels at the base of the brain show flow. Skull and upper cervical spine: Negative Sinuses/Orbits: Sinuses are clear except for a small amount of fluid layering in the left division of the sphenoid sinus, not likely significant. Orbits negative. Previous lens implant on right Other: None IMPRESSION: No acute finding. Age related atrophy. 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 Electronically Signed   By: Nelson Chimes M.D.   On: 04/17/2020 10:50   DG Pelvis Portable  Result Date: 04/13/2020 CLINICAL DATA:  Fall EXAM: PORTABLE PELVIS 1-2 VIEWS COMPARISON:  None. FINDINGS: Prior right hip replacement. Normal AP alignment. Mild degenerative changes within the left hip. SI joints symmetric and unremarkable. No acute bony abnormality. Specifically, no fracture, subluxation, or dislocation. IMPRESSION: No acute bony abnormality. Electronically Signed   By: Rolm Baptise M.D.   On: 04/13/2020 19:21   DG Chest Port 1 View  Result Date: 04/13/2020 CLINICAL DATA:  Fall, COVID EXAM: PORTABLE CHEST 1 VIEW COMPARISON:  04/11/2020 FINDINGS: Right Port-A-Cath remains in place, unchanged. Heart is normal size. Low lung volumes. Diffuse interstitial prominence throughout the lungs compatible with chronic lung disease/fibrosis. No definite acute confluent airspace opacity. Previously seen rounded mass posteriorly in the right lung better seen on prior CT. No effusions. IMPRESSION: Stable appearance of the chest. Electronically Signed   By: Rolm Baptise M.D.   On: 04/13/2020 19:21   DG Ankle Right Port  Result Date: 04/17/2020 CLINICAL DATA:  79 year old female with soft tissue swelling of  the lateral malleolus. No known injury. EXAM: PORTABLE RIGHT ANKLE - 2 VIEW; RIGHT FOOT - 2 VIEW COMPARISON:  None. FINDINGS: There is no acute fracture or dislocation. The bones are osteopenic. The ankle mortise is intact. The soft tissues are unremarkable. IMPRESSION: No acute fracture or dislocation. Electronically Signed   By: Anner Crete M.D.   On: 04/17/2020 21:16   DG Foot 2 Views Right  Result Date: 04/17/2020 CLINICAL DATA:  79 year old female with soft tissue swelling of the lateral malleolus. No known injury. EXAM: PORTABLE RIGHT ANKLE - 2 VIEW; RIGHT FOOT - 2 VIEW COMPARISON:  None. FINDINGS: There is no acute fracture or dislocation. The bones are osteopenic. The ankle mortise is intact. The soft tissues are unremarkable. IMPRESSION: No acute fracture or dislocation. Electronically Signed   By: Anner Crete M.D.   On: 04/17/2020 21:16   EEG adult  Result Date: 04/16/2020 Lora Havens, MD     04/16/2020  1:37 PM Patient Name: LARIA GRIMMETT MRN: [id number] Epilepsy Attending: Lora Havens Referring Physician/Provider: Dr Oren Binet Date: 04/16/2020 Duration: 25.12 mins Patient history: 79yo F with ams. EEG to evaluate for seizure. Level of alertness: Awake AEDs during EEG study: xanax, pregabalin Technical aspects: This EEG study was done with scalp electrodes positioned according to the 10-20 International system of electrode placement. Electrical activity was acquired at a sampling rate of 500Hz  and reviewed with a high frequency filter of 70Hz  and a low frequency filter of 1Hz . EEG data were recorded continuously and digitally stored. Description: The  posterior dominant rhythm consists of 8 Hz activity of moderate voltage (25-35 uV) seen predominantly in posterior head regions, symmetric and reactive to eye opening and eye closing.  EEG showed intermittent generalized 3 to 6 Hz theta-delta slowing. Triphasic waves, generalized were also noted. Hyperventilation and  photic stimulation were not performed.   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 No seizures or epileptiform discharges were seen throughout the recording. [place]   DG HIP UNILAT WITH PELVIS 2-3 VIEWS RIGHT  Result Date: 04/18/2020 CLINICAL DATA:  Acute onset right hip pain. No known injury. History of prior right hip replacement. EXAM: DG HIP (WITH OR WITHOUT PELVIS) 2-3V RIGHT COMPARISON:  Plain film of the pelvis 04/13/2020. PET CT scan 01/25/2020. FINDINGS: The first image which is the AP view is over penetrated. Right hip arthroplasty is in place. No hardware complication. No fracture, dislocation or focal bony lesion. Soft tissues are negative. IMPRESSION: No acute abnormality.  Status post right hip replacement. Electronically Signed   By: Inge Rise M.D.   On: 04/18/2020 15:03     TODAY-DAY OF DISCHARGE:  Subjective:   [name] today remains pleasantly confused.  Objective:   Blood pressure 139/75, pulse (!) 105, temperature 98.5 F (36.9 C), temperature source Oral, resp. rate 16, height 5\' 4"  (1.626 m), weight 66.5 kg, SpO2 92 %.  Intake/Output Summary (Last 24 hours) at 04/26/2020 1015 Last data filed at 04/26/2020 0952 Gross per 24 hour  Intake 500 ml  Output 500 ml  Net 0 ml   Filed Weights   04/13/20 2024 04/23/20 0200  Weight: 68 kg 66.5 kg    Exam: Confused-sleepy-not following any commands today.   PERTINENT RADIOLOGIC STUDIES: CT Head Wo Contrast  Result Date: 04/13/2020 CLINICAL DATA:  79 year old female with head trauma. EXAM: CT HEAD WITHOUT CONTRAST CT CERVICAL SPINE WITHOUT CONTRAST TECHNIQUE: Multidetector CT imaging of the head and cervical spine was performed following the standard protocol without intravenous contrast. Multiplanar CT  image reconstructions of the cervical spine were also generated. COMPARISON:  CT dated 08/28/2019. FINDINGS: CT HEAD FINDINGS Brain: Mild age-related atrophy and chronic microvascular ischemic changes. There is no acute intracranial hemorrhage. No mass effect or midline shift. No extra-axial fluid collection. Vascular: No hyperdense vessel or unexpected calcification. Skull: Normal. Negative for fracture or focal lesion. Sinuses/Orbits: No acute finding. Other: None CT CERVICAL SPINE FINDINGS Alignment: No acute subluxation. Skull base and vertebrae: No acute fracture. Osteopenia. Soft tissues and spinal canal: No prevertebral fluid or swelling. No visible canal hematoma. Disc levels:  Degenerative changes. No acute findings. Upper chest: Small left upper lobe calcified granuloma. Partially visualized right subclavian central venous line. Other: Bilateral carotid bulb calcified plaques. IMPRESSION: 1. No acute intracranial pathology. Mild age-related atrophy and chronic microvascular ischemic changes. 2. No acute/traumatic cervical spine pathology. Electronically Signed   By: Anner Crete M.D.   On: 04/13/2020 20:44   CT CHEST WO CONTRAST  Result Date: 04/14/2020 CLINICAL DATA:  Respiratory illness EXAM: CT CHEST WITHOUT CONTRAST TECHNIQUE: Multidetector CT imaging of the chest was performed following the standard protocol without IV contrast. COMPARISON:  Chest x-ray 04/13/2020.  CT 04/11/2020 FINDINGS: Cardiovascular: Aortic atherosclerosis. Scattered coronary artery calcifications. Heart is normal size and aorta is normal caliber. Mediastinum/Nodes: Small scattered non pathologically enlarged mediastinal lymph nodes, stable. No mediastinal, hilar, or axillary adenopathy. Trachea and esophagus are unremarkable.  Thyroid unremarkable. Lungs/Pleura: Peripheral interstitial prominence in thickening compatible with fibrosis, stable. Rounded partially necrotic mass in the posterior right lower lobe measures 3.1  cm and is slightly decreased in size since prior study when this measured 3.8 cm. No effusions. No confluent opacities on the left. Calcified granuloma in the left upper lobe. Upper Abdomen: Imaging into the upper abdomen demonstrates no acute findings. Musculoskeletal: Chest wall soft tissues are unremarkable. Right upper chest wall Port-A-Cath remains in place, unchanged. No acute bony abnormality. Old posterior right 7th rib fracture and upper thoracic compression fractures, unchanged. IMPRESSION: Partially necrotic mass in the posterior right lower lobe is again noted, slightly decreased in size since prior study, 3.1 cm currently compared to 3.8 cm previously. Changes of fibrosis throughout the lungs. Coronary artery disease. Aortic Atherosclerosis (ICD10-I70.0). Electronically Signed   By: Rolm Baptise M.D.   On: 04/14/2020 18:33   CT Cervical Spine Wo Contrast  Result Date: 04/13/2020 CLINICAL DATA:  79 year old female with head trauma. EXAM: CT HEAD WITHOUT CONTRAST CT CERVICAL SPINE WITHOUT CONTRAST TECHNIQUE: Multidetector CT imaging of the head and cervical spine was performed following the standard protocol without intravenous contrast. Multiplanar CT image reconstructions of the cervical spine were also generated. COMPARISON:  CT dated 08/28/2019. FINDINGS: CT HEAD FINDINGS Brain: Mild age-related atrophy and chronic microvascular ischemic changes. There is no acute intracranial hemorrhage. No mass effect or midline shift. No extra-axial fluid collection. Vascular: No hyperdense vessel or unexpected calcification. Skull: Normal. Negative for fracture or focal lesion. Sinuses/Orbits: No acute finding. Other: None CT CERVICAL SPINE FINDINGS Alignment: No acute subluxation. Skull base and vertebrae: No acute fracture. Osteopenia. Soft tissues and spinal canal: No prevertebral fluid or swelling. No visible canal hematoma. Disc levels:  Degenerative changes. No acute findings. Upper chest: Small left  upper lobe calcified granuloma. Partially visualized right subclavian central venous line. Other: Bilateral carotid bulb calcified plaques. IMPRESSION: 1. No acute intracranial pathology. Mild age-related atrophy and chronic microvascular ischemic changes. 2. No acute/traumatic cervical spine pathology. Electronically Signed   By: Anner Crete M.D.   On: 04/13/2020 20:44   MR BRAIN WO CONTRAST  Result Date: 04/17/2020 CLINICAL DATA:  Coronavirus infection. Altered mental status. History of squamous cell carcinoma of the lung. EXAM: MRI HEAD WITHOUT CONTRAST TECHNIQUE: Multiplanar, multiecho pulse sequences of the brain and surrounding structures were obtained without intravenous contrast. COMPARISON:  Head CT 04/13/2020.  MRI 03/04/2020. FINDINGS: Brain: Diffusion imaging does not show any acute or subacute infarction or other cause of restricted diffusion. No abnormality affects the brainstem or cerebellum. Cerebral hemispheres show age related atrophy with mild chronic small-vessel ischemic changes of the white matter, often seen at this age. No cortical or large vessel territory infarction. No mass lesion, hemorrhage, hydrocephalus or extra-axial collection. Vascular: Major vessels at the base of the brain show flow. Skull and upper cervical spine: Negative Sinuses/Orbits: Sinuses are clear except for a small amount of fluid layering in the left division of the sphenoid sinus, not likely significant. Orbits negative. Previous lens implant on right Other: None IMPRESSION: No acute finding. Age related atrophy. 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 Electronically Signed   By: Nelson Chimes M.D.   On: 04/17/2020 10:50   DG Pelvis Portable  Result Date: 04/13/2020 CLINICAL DATA:  Fall EXAM: PORTABLE PELVIS 1-2 VIEWS COMPARISON:  None. FINDINGS: Prior right hip replacement. Normal AP alignment. Mild degenerative changes within the left hip. SI joints  symmetric  and unremarkable. No acute bony abnormality. Specifically, no fracture, subluxation, or dislocation. IMPRESSION: No acute bony abnormality. Electronically Signed   By: Rolm Baptise M.D.   On: 04/13/2020 19:21   DG Chest Port 1 View  Result Date: 04/13/2020 CLINICAL DATA:  Fall, COVID EXAM: PORTABLE CHEST 1 VIEW COMPARISON:  04/11/2020 FINDINGS: Right Port-A-Cath remains in place, unchanged. Heart is normal size. Low lung volumes. Diffuse interstitial prominence throughout the lungs compatible with chronic lung disease/fibrosis. No definite acute confluent airspace opacity. Previously seen rounded mass posteriorly in the right lung better seen on prior CT. No effusions. IMPRESSION: Stable appearance of the chest. Electronically Signed   By: Rolm Baptise M.D.   On: 04/13/2020 19:21   DG Ankle Right Port  Result Date: 04/17/2020 CLINICAL DATA:  79 year old female with soft tissue swelling of the lateral malleolus. No known injury. EXAM: PORTABLE RIGHT ANKLE - 2 VIEW; RIGHT FOOT - 2 VIEW COMPARISON:  None. FINDINGS: There is no acute fracture or dislocation. The bones are osteopenic. The ankle mortise is intact. The soft tissues are unremarkable. IMPRESSION: No acute fracture or dislocation. Electronically Signed   By: Anner Crete M.D.   On: 04/17/2020 21:16   DG Foot 2 Views Right  Result Date: 04/17/2020 CLINICAL DATA:  79 year old female with soft tissue swelling of the lateral malleolus. No known injury. EXAM: PORTABLE RIGHT ANKLE - 2 VIEW; RIGHT FOOT - 2 VIEW COMPARISON:  None. FINDINGS: There is no acute fracture or dislocation. The bones are osteopenic. The ankle mortise is intact. The soft tissues are unremarkable. IMPRESSION: No acute fracture or dislocation. Electronically Signed   By: Anner Crete M.D.   On: 04/17/2020 21:16   EEG adult  Result Date: 04/16/2020 Lora Havens, MD     04/16/2020  1:37 PM Patient Name: ALYANAH ELLIOTT MRN: [id number] Epilepsy Attending:  Lora Havens Referring Physician/Provider: Dr Oren Binet Date: 04/16/2020 Duration: 25.12 mins Patient history: 79yo F with ams. EEG to evaluate for seizure. Level of alertness: Awake AEDs during EEG study: xanax, pregabalin Technical aspects: This EEG study was done with scalp electrodes positioned according to the 10-20 International system of electrode placement. Electrical activity was acquired at a sampling rate of 500Hz  and reviewed with a high frequency filter of 70Hz  and a low frequency filter of 1Hz . EEG data were recorded continuously and digitally stored. Description: The posterior dominant rhythm consists of 8 Hz activity of moderate voltage (25-35 uV) seen predominantly in posterior head regions, symmetric and reactive to eye opening and eye closing.  EEG showed intermittent generalized 3 to 6 Hz theta-delta slowing. Triphasic waves, generalized were also noted. Hyperventilation and photic stimulation were not performed.   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 No seizures or epileptiform discharges were seen throughout the recording. [place]   DG HIP UNILAT WITH PELVIS 2-3 VIEWS RIGHT  Result Date: 04/18/2020 CLINICAL DATA:  Acute onset right hip pain. No known injury. History of prior right hip replacement. EXAM: DG HIP (WITH OR WITHOUT PELVIS) 2-3V RIGHT COMPARISON:  Plain film of the pelvis 04/13/2020. PET CT scan 01/25/2020. FINDINGS: The first image which is the AP view is over penetrated. Right hip arthroplasty is in place. No hardware complication. No fracture, dislocation or focal bony lesion. Soft tissues are negative. IMPRESSION: No acute abnormality.  Status post right hip replacement. Electronically Signed  By: Inge Rise M.D.   On: 04/18/2020 15:03      PERTINENT LAB RESULTS: CBC: No results for input(s): WBC, HGB, HCT, PLT in the last 72 hours. CMET CMP     Component Value Date/Time   NA 150 (H) 04/23/2020 0239   K 3.1 (L) 04/23/2020 0239   CL 112 (H) 04/23/2020 0239   CO2 25 04/23/2020 0239   GLUCOSE 112 (H) 04/23/2020 0239   BUN 31 (H) 04/23/2020 0239   CREATININE 1.05 (H) 04/23/2020 0239   CALCIUM 9.6 04/23/2020 0239   PROT 7.5 04/22/2020 0412   ALBUMIN 2.9 (L) 04/22/2020 0412   AST 50 (H) 04/22/2020 0412   ALT 28 04/22/2020 0412   ALKPHOS 82 04/22/2020 0412   BILITOT 1.5 (H) 04/22/2020 0412   GFRNONAA 54 (L) 04/23/2020 0239    GFR Estimated Creatinine Clearance: 41.4 mL/min (A) (by C-G formula based on SCr of 1.05 mg/dL (H)). No results for input(s): LIPASE, AMYLASE in the last 72 hours. No results for input(s): CKTOTAL, CKMB, CKMBINDEX, TROPONINI in the last 72 hours. Invalid input(s): POCBNP No results for input(s): DDIMER in the last 72 hours. No results for input(s): HGBA1C in the last 72 hours. No results for input(s): CHOL, HDL, LDLCALC, TRIG, CHOLHDL, LDLDIRECT in the last 72 hours. No results for input(s): TSH, T4TOTAL, T3FREE, THYROIDAB in the last 72 hours.  Invalid input(s): FREET3 No results for input(s): VITAMINB12, FOLATE, FERRITIN, TIBC, IRON, RETICCTPCT in the last 72 hours. Coags: No results for input(s): INR in the last 72 hours.  Invalid input(s): PT Microbiology: Recent Results (from the past 240 hour(s))  Culture, blood (routine x 2)     Status: None   Collection Time: 04/19/20  1:03 PM   Specimen: BLOOD  Result Value Ref Range Status   Specimen Description BLOOD LEFT ANTECUBITAL  Final   Special Requests   Final    BOTTLES DRAWN AEROBIC ONLY Blood Culture results may not be optimal due to an inadequate volume of blood received in culture bottles   Culture   Final    NO GROWTH 5 DAYS Performed at [facility], [place] [address] [place], [place] [address]    Report Status  04/24/2020 FINAL  Final  Culture, blood (routine x 2)     Status: None   Collection Time: 04/19/20  6:26 PM   Specimen: BLOOD  Result Value Ref Range Status   Specimen Description BLOOD LEFT ANTECUBITAL  Final   Special Requests   Final    BOTTLES DRAWN AEROBIC AND ANAEROBIC Blood Culture results may not be optimal due to an excessive volume of blood received in culture bottles   Culture   Final    NO GROWTH 5 DAYS Performed at [facility], [place] [address], [place], [place] [address]    Report Status 04/24/2020 FINAL  Final    FURTHER DISCHARGE INSTRUCTIONS:  Get Medicines reviewed and adjusted: Please take all your medications with you for your next visit with your Primary MD  Laboratory/radiological data: Please request your Primary MD to go over all hospital tests and procedure/radiological results at the follow up, please ask your Primary MD to get all Hospital records sent to his/her office.  In some cases, they will be blood work, cultures and biopsy results pending at the time of your discharge. Please request that your primary care M.D. goes through all the records of your hospital data and follows up on these results.  Also Note the  following: If you experience worsening of your admission symptoms, develop shortness of breath, life threatening emergency, suicidal or homicidal thoughts you must seek medical attention immediately by calling 911 or calling your MD immediately  if symptoms less severe.  You must read complete instructions/literature along with all the possible adverse reactions/side effects for all the Medicines you take and that have been prescribed to you. Take any new Medicines after you have completely understood and accpet all the possible adverse reactions/side effects.   Do not drive when taking Pain medications or sleeping medications (Benzodaizepines)  Do not take more than prescribed Pain, Sleep and Anxiety Medications. It is not advisable to  combine anxiety,sleep and pain medications without talking with your primary care practitioner  Special Instructions: If you have smoked or chewed Tobacco  in the last 2 yrs please stop smoking, stop any regular Alcohol  and or any Recreational drug use.  Wear Seat belts while driving.  Please note: You were cared for by a hospitalist during your hospital stay. Once you are discharged, your primary care physician will handle any further medical issues. Please note that NO REFILLS for any discharge medications will be authorized once you are discharged, as it is imperative that you return to your primary care physician (or establish a relationship with a primary care physician if you do not have one) for your post hospital discharge needs so that they can reassess your need for medications and monitor your lab values.  Total Time spent coordinating discharge including counseling, education and face to face time equals 35 minutes.  SignedOren Binet 04/26/2020 10:15 AM

## 2020-04-26 NOTE — Discharge Instructions (Signed)
COVID-19 COVID-19 is a respiratory infection that is caused by a virus called severe acute respiratory syndrome coronavirus 2 (SARS-CoV-2). The disease is also known as coronavirus disease or novel coronavirus. In some people, the virus may not cause any symptoms. In others, it may cause a serious infection. The infection can get worse quickly and can lead to complications, such as:  Pneumonia, or infection of the lungs.  Acute respiratory distress syndrome or ARDS. This is a condition in which fluid build-up in the lungs prevents the lungs from filling with air and passing oxygen into the blood.  Acute respiratory failure. This is a condition in which there is not enough oxygen passing from the lungs to the body or when carbon dioxide is not passing from the lungs out of the body.  Sepsis or septic shock. This is a serious bodily reaction to an infection.  Blood clotting problems.  Secondary infections due to bacteria or fungus.  Organ failure. This is when your body's organs stop working. The virus that causes COVID-19 is contagious. This means that it can spread from person to person through droplets from coughs and sneezes (respiratory secretions). What are the causes? This illness is caused by a virus. You may catch the virus by:  Breathing in droplets from an infected person. Droplets can be spread by a person breathing, speaking, singing, coughing, or sneezing.  Touching something, like a table or a doorknob, that was exposed to the virus (contaminated) and then touching your mouth, nose, or eyes. What increases the risk? Risk for infection You are more likely to be infected with this virus if you:  Are within 6 feet (2 meters) of a person with COVID-19.  Provide care for or live with a person who is infected with COVID-19.  Spend time in crowded indoor spaces or live in shared housing. Risk for serious illness You are more likely to become seriously ill from the virus if  you:  Are 69 years of age or older. The higher your age, the more you are at risk for serious illness.  Live in a nursing home or long-term care facility.  Have cancer.  Have a long-term (chronic) disease such as: ? Chronic lung disease, including chronic obstructive pulmonary disease or asthma. ? A long-term disease that lowers your body's ability to fight infection (immunocompromised). ? Heart disease, including heart failure, a condition in which the arteries that lead to the heart become narrow or blocked (coronary artery disease), a disease which makes the heart muscle thick, weak, or stiff (cardiomyopathy). ? Diabetes. ? Chronic kidney disease. ? Sickle cell disease, a condition in which red blood cells have an abnormal "sickle" shape. ? Liver disease.  Are obese. What are the signs or symptoms? Symptoms of this condition can range from mild to severe. Symptoms may appear any time from 2 to 14 days after being exposed to the virus. They include:  A fever or chills.  A cough.  Difficulty breathing.  Headaches, body aches, or muscle aches.  Runny or stuffy (congested) nose.  A sore throat.  New loss of taste or smell. Some people may also have stomach problems, such as nausea, vomiting, or diarrhea. Other people may not have any symptoms of COVID-19. How is this diagnosed? This condition may be diagnosed based on:  Your signs and symptoms, especially if: ? You live in an area with a COVID-19 outbreak. ? You recently traveled to or from an area where the virus is common. ?  You provide care for or live with a person who was diagnosed with COVID-19. ? You were exposed to a person who was diagnosed with COVID-19.  A physical exam.  Lab tests, which may include: ? Taking a sample of fluid from the back of your nose and throat (nasopharyngeal fluid), your nose, or your throat using a swab. ? A sample of mucus from your lungs (sputum). ? Blood tests.  Imaging tests,  which may include, X-rays, CT scan, or ultrasound. How is this treated? At present, there is no medicine to treat COVID-19. Medicines that treat other diseases are being used on a trial basis to see if they are effective against COVID-19. Your health care provider will talk with you about ways to treat your symptoms. For most people, the infection is mild and can be managed at home with rest, fluids, and over-the-counter medicines. Treatment for a serious infection usually takes places in a hospital intensive care unit (ICU). It may include one or more of the following treatments. These treatments are given until your symptoms improve.  Receiving fluids and medicines through an IV.  Supplemental oxygen. Extra oxygen is given through a tube in the nose, a face mask, or a hood.  Positioning you to lie on your stomach (prone position). This makes it easier for oxygen to get into the lungs.  Continuous positive airway pressure (CPAP) or bi-level positive airway pressure (BPAP) machine. This treatment uses mild air pressure to keep the airways open. A tube that is connected to a motor delivers oxygen to the body.  Ventilator. This treatment moves air into and out of the lungs by using a tube that is placed in your windpipe.  Tracheostomy. This is a procedure to create a hole in the neck so that a breathing tube can be inserted.  Extracorporeal membrane oxygenation (ECMO). This procedure gives the lungs a chance to recover by taking over the functions of the heart and lungs. It supplies oxygen to the body and removes carbon dioxide. Follow these instructions at home: Lifestyle  If you are sick, stay home except to get medical care. Your health care provider will tell you how long to stay home. Call your health care provider before you go for medical care.  Rest at home as told by your health care provider.  Do not use any products that contain nicotine or tobacco, such as cigarettes,  e-cigarettes, and chewing tobacco. If you need help quitting, ask your health care provider.  Return to your normal activities as told by your health care provider. Ask your health care provider what activities are safe for you. General instructions  Take over-the-counter and prescription medicines only as told by your health care provider.  Drink enough fluid to keep your urine pale yellow.  Keep all follow-up visits as told by your health care provider. This is important. How is this prevented?  There is no vaccine to help prevent COVID-19 infection. However, there are steps you can take to protect yourself and others from this virus. To protect yourself:   Do not travel to areas where COVID-19 is a risk. The areas where COVID-19 is reported change often. To identify high-risk areas and travel restrictions, check the CDC travel website: FatFares.com.br  If you live in, or must travel to, an area where COVID-19 is a risk, take precautions to avoid infection. ? Stay away from people who are sick. ? Wash your hands often with soap and water for 20 seconds. If soap and  water are not available, use an alcohol-based hand sanitizer. ? Avoid touching your mouth, face, eyes, or nose. ? Avoid going out in public, follow guidance from your state and local health authorities. ? If you must go out in public, wear a cloth face covering or face mask. Make sure your mask covers your nose and mouth. ? Avoid crowded indoor spaces. Stay at least 6 feet (2 meters) away from others. ? Disinfect objects and surfaces that are frequently touched every day. This may include:  Counters and tables.  Doorknobs and light switches.  Sinks and faucets.  Electronics, such as phones, remote controls, keyboards, computers, and tablets. To protect others: If you have symptoms of COVID-19, take steps to prevent the virus from spreading to others.  If you think you have a COVID-19 infection, contact  your health care provider right away. Tell your health care team that you think you may have a COVID-19 infection.  Stay home. Leave your house only to seek medical care. Do not use public transport.  Do not travel while you are sick.  Wash your hands often with soap and water for 20 seconds. If soap and water are not available, use alcohol-based hand sanitizer.  Stay away from other members of your household. Let healthy household members care for children and pets, if possible. If you have to care for children or pets, wash your hands often and wear a mask. If possible, stay in your own room, separate from others. Use a different bathroom.  Make sure that all people in your household wash their hands well and often.  Cough or sneeze into a tissue or your sleeve or elbow. Do not cough or sneeze into your hand or into the air.  Wear a cloth face covering or face mask. Make sure your mask covers your nose and mouth. Where to find more information  Centers for Disease Control and Prevention: PurpleGadgets.be  World Health Organization: https://www.castaneda.info/ Contact a health care provider if:  You live in or have traveled to an area where COVID-19 is a risk and you have symptoms of the infection.  You have had contact with someone who has COVID-19 and you have symptoms of the infection. Get help right away if:  You have trouble breathing.  You have pain or pressure in your chest.  You have confusion.  You have bluish lips and fingernails.  You have difficulty waking from sleep.  You have symptoms that get worse. These symptoms may represent a serious problem that is an emergency. Do not wait to see if the symptoms will go away. Get medical help right away. Call your local emergency services (911 in the [place] Do not drive yourself to the hospital. Let the emergency medical personnel know if you think you have  COVID-19. Summary  COVID-19 is a respiratory infection that is caused by a virus. It is also known as coronavirus disease or novel coronavirus. It can cause serious infections, such as pneumonia, acute respiratory distress syndrome, acute respiratory failure, or sepsis.  The virus that causes COVID-19 is contagious. This means that it can spread from person to person through droplets from breathing, speaking, singing, coughing, or sneezing.  You are more likely to develop a serious illness if you are 68 years of age or older, have a weak immune system, live in a nursing home, or have chronic disease.  There is no medicine to treat COVID-19. Your health care provider will talk with you about ways to treat your  symptoms.  Take steps to protect yourself and others from infection. Wash your hands often and disinfect objects and surfaces that are frequently touched every day. Stay away from people who are sick and wear a mask if you are sick. This information is not intended to replace advice given to you by your health care provider. Make sure you discuss any questions you have with your health care provider. Document Revised: 03/12/2019 Document Reviewed: 06/18/2018 Elsevier Patient Education  [place] on my medicine - ELIQUIS (apixaban)  This medication education was reviewed with me or my healthcare representative as part of my discharge preparation.  The pharmacist that spoke with me during my hospital stay was:  Onnie Boer, RPH-CPP  Why was Eliquis prescribed for you? Eliquis was prescribed for you to reduce the risk of forming blood clots that can cause a stroke if you have a medical condition called atrial fibrillation (a type of irregular heartbeat) OR to reduce the risk of a blood clots forming after orthopedic surgery.  What do You need to know about Eliquis ? Take your Eliquis TWICE DAILY - one tablet in the morning and one tablet in the evening with or  without food.  It would be best to take the doses about the same time each day.  If you have difficulty swallowing the tablet whole please discuss with your pharmacist how to take the medication safely.  Take Eliquis exactly as prescribed by your doctor and DO NOT stop taking Eliquis without talking to the doctor who prescribed the medication.  Stopping may increase your risk of developing a new clot or stroke.  Refill your prescription before you run out.  After discharge, you should have regular check-up appointments with your healthcare provider that is prescribing your Eliquis.  In the future your dose may need to be changed if your kidney function or weight changes by a significant amount or as you get older.  What do you do if you miss a dose? If you miss a dose, take it as soon as you remember on the same day and resume taking twice daily.  Do not take more than one dose of ELIQUIS at the same time.  Important Safety Information A possible side effect of Eliquis is bleeding. You should call your healthcare provider right away if you experience any of the following: ? Bleeding from an injury or your nose that does not stop. ? Unusual colored urine (red or dark brown) or unusual colored stools (red or black). ? Unusual bruising for unknown reasons. ? A serious fall or if you hit your head (even if there is no bleeding).  Some medicines may interact with Eliquis and might increase your risk of bleeding or clotting while on Eliquis. To help avoid this, consult your healthcare provider or pharmacist prior to using any new prescription or non-prescription medications, including herbals, vitamins, non-steroidal anti-inflammatory drugs (NSAIDs) and supplements.  This website has more information on Eliquis (apixaban): www.DubaiSkin.no.

## 2020-04-26 NOTE — Progress Notes (Signed)
Report has been called to Chiropodist at [facility] in [place].

## 2020-04-26 NOTE — Progress Notes (Signed)
Manufacturing engineer [facility]) Hospital Liaison note.    Chart reviewed and eligibility confirmed. Consents have been signed and pt can be transferred at any time to [facility] in [place].   RN please call report to [phone].  Please be sure the [facility] form transports with the patient.  Thank you for the opportunity to participate in this patient's care.  Chrislyn Edison Pace, BSN, RN [place] (listed on [place] under Hosp[facility])    [phone]

## 2020-04-26 NOTE — Progress Notes (Signed)
Ptar here to picked up pt and transport to [facility] in [place] via [place] transport. Pt alert and oriented to self. No acute distress noted. Pt's belongings taken with pt. Port left accessed as requested by Delia Chimes at [facility].

## 2020-04-27 ENCOUNTER — Ambulatory Visit: Admission: RE | Admit: 2020-04-27 | Payer: Medicare Other | Source: Ambulatory Visit

## 2020-04-27 ENCOUNTER — Inpatient Hospital Stay: Payer: Medicare Other

## 2020-04-27 ENCOUNTER — Ambulatory Visit: Payer: Medicare Other

## 2020-04-27 ENCOUNTER — Encounter (HOSPITAL_COMMUNITY): Payer: Self-pay

## 2020-04-28 ENCOUNTER — Inpatient Hospital Stay: Payer: Medicare Other

## 2020-04-28 ENCOUNTER — Ambulatory Visit: Payer: Medicare Other

## 2020-05-01 ENCOUNTER — Inpatient Hospital Stay: Payer: Medicare Other | Admitting: Oncology

## 2020-05-01 ENCOUNTER — Ambulatory Visit: Payer: Medicare Other

## 2020-05-01 ENCOUNTER — Inpatient Hospital Stay: Payer: Medicare Other

## 2020-05-02 ENCOUNTER — Ambulatory Visit: Payer: Medicare Other

## 2020-05-02 ENCOUNTER — Telehealth: Payer: Self-pay | Admitting: Family Medicine

## 2020-05-02 ENCOUNTER — Inpatient Hospital Stay: Payer: Medicare Other

## 2020-05-03 ENCOUNTER — Ambulatory Visit: Payer: Medicare Other

## 2020-05-03 ENCOUNTER — Other Ambulatory Visit: Payer: Self-pay | Admitting: Family Medicine

## 2020-05-03 ENCOUNTER — Inpatient Hospital Stay: Payer: Medicare Other

## 2020-05-04 ENCOUNTER — Ambulatory Visit: Payer: Medicare Other

## 2020-05-04 ENCOUNTER — Inpatient Hospital Stay: Payer: Medicare Other

## 2020-05-05 ENCOUNTER — Ambulatory Visit: Payer: Medicare Other

## 2020-05-06 IMAGING — CR DG HIP (WITH OR WITHOUT PELVIS) 2-3V*R*
1 series · 3 of 3 positions shown · non-contrast
Comparison: June 25, 2018

CLINICAL DATA: Painful right hip.

EXAM:
DG HIP (WITH OR WITHOUT PELVIS) 2-3V RIGHT

[Series 1: dg hip unilat w or w/o pelvis 2-3 views  · non-contrast · 0.14mm/px · 3 of 3 slices shown]
[im 1/3]
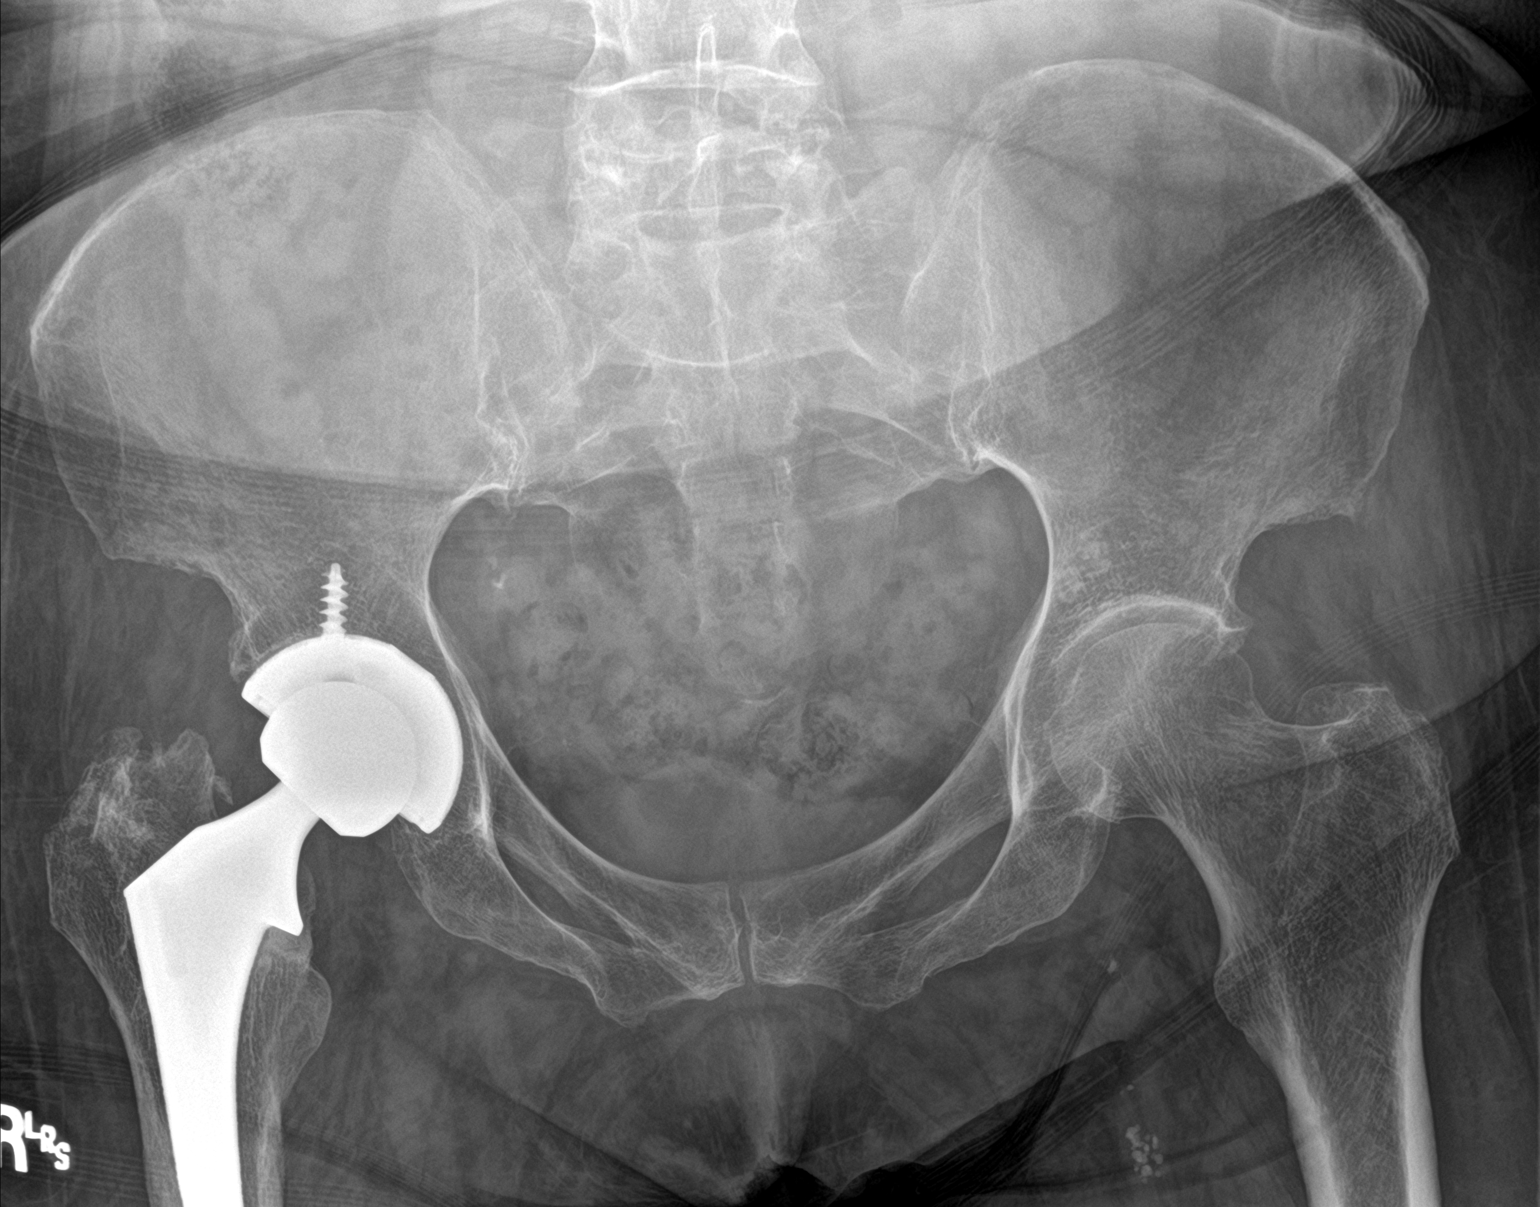
[im 2/3]
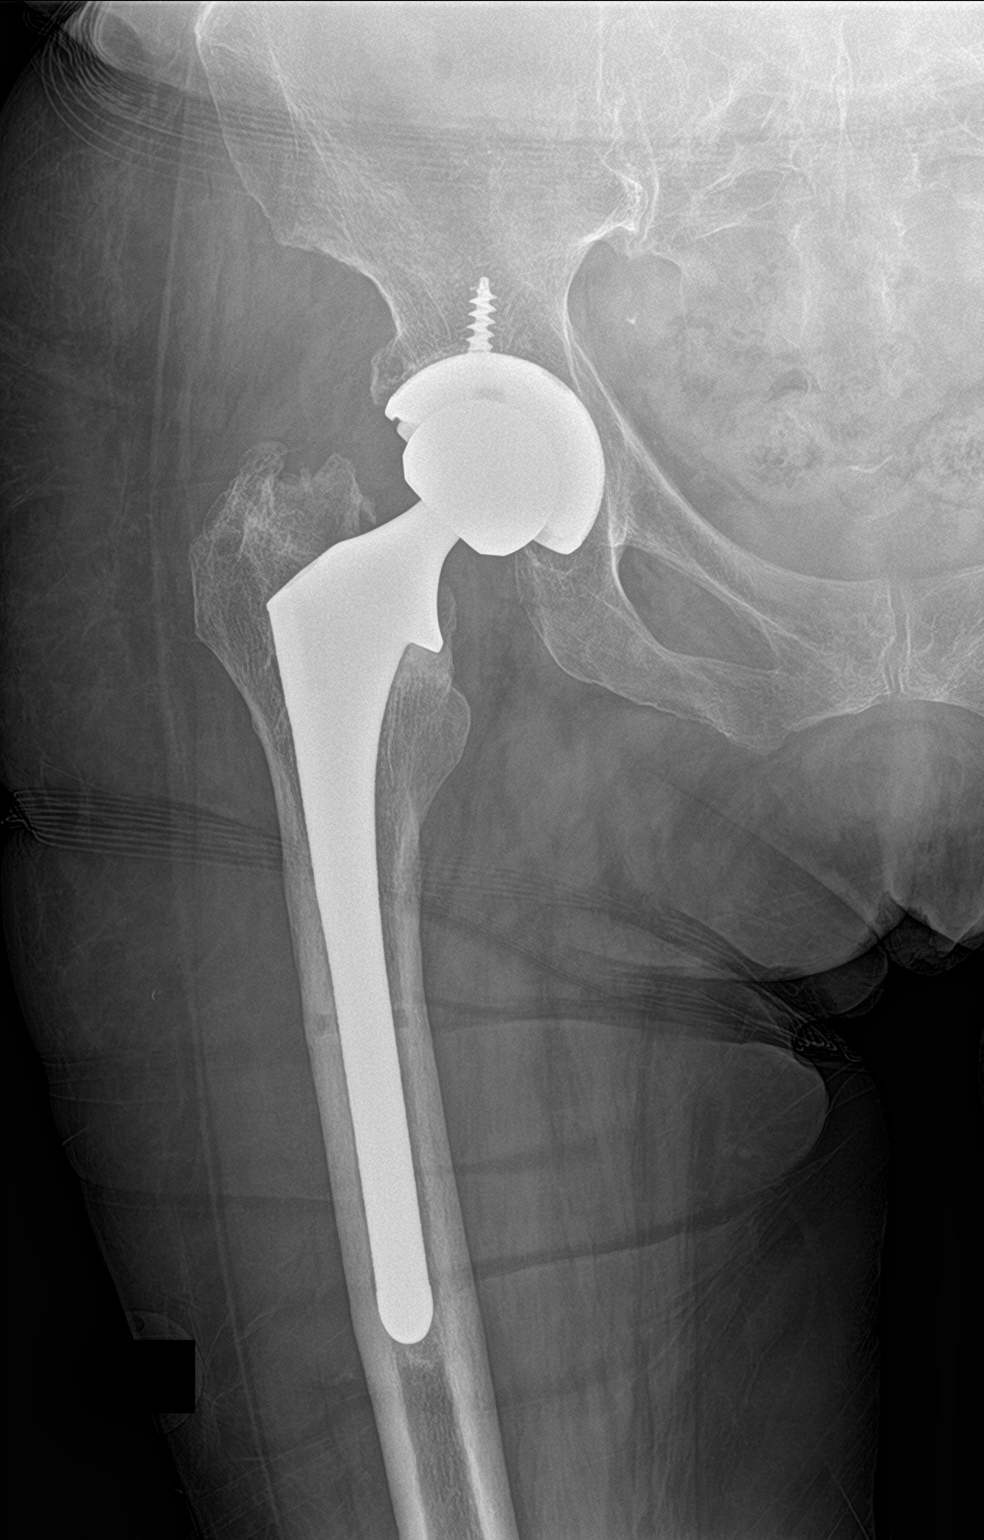
[im 3/3]
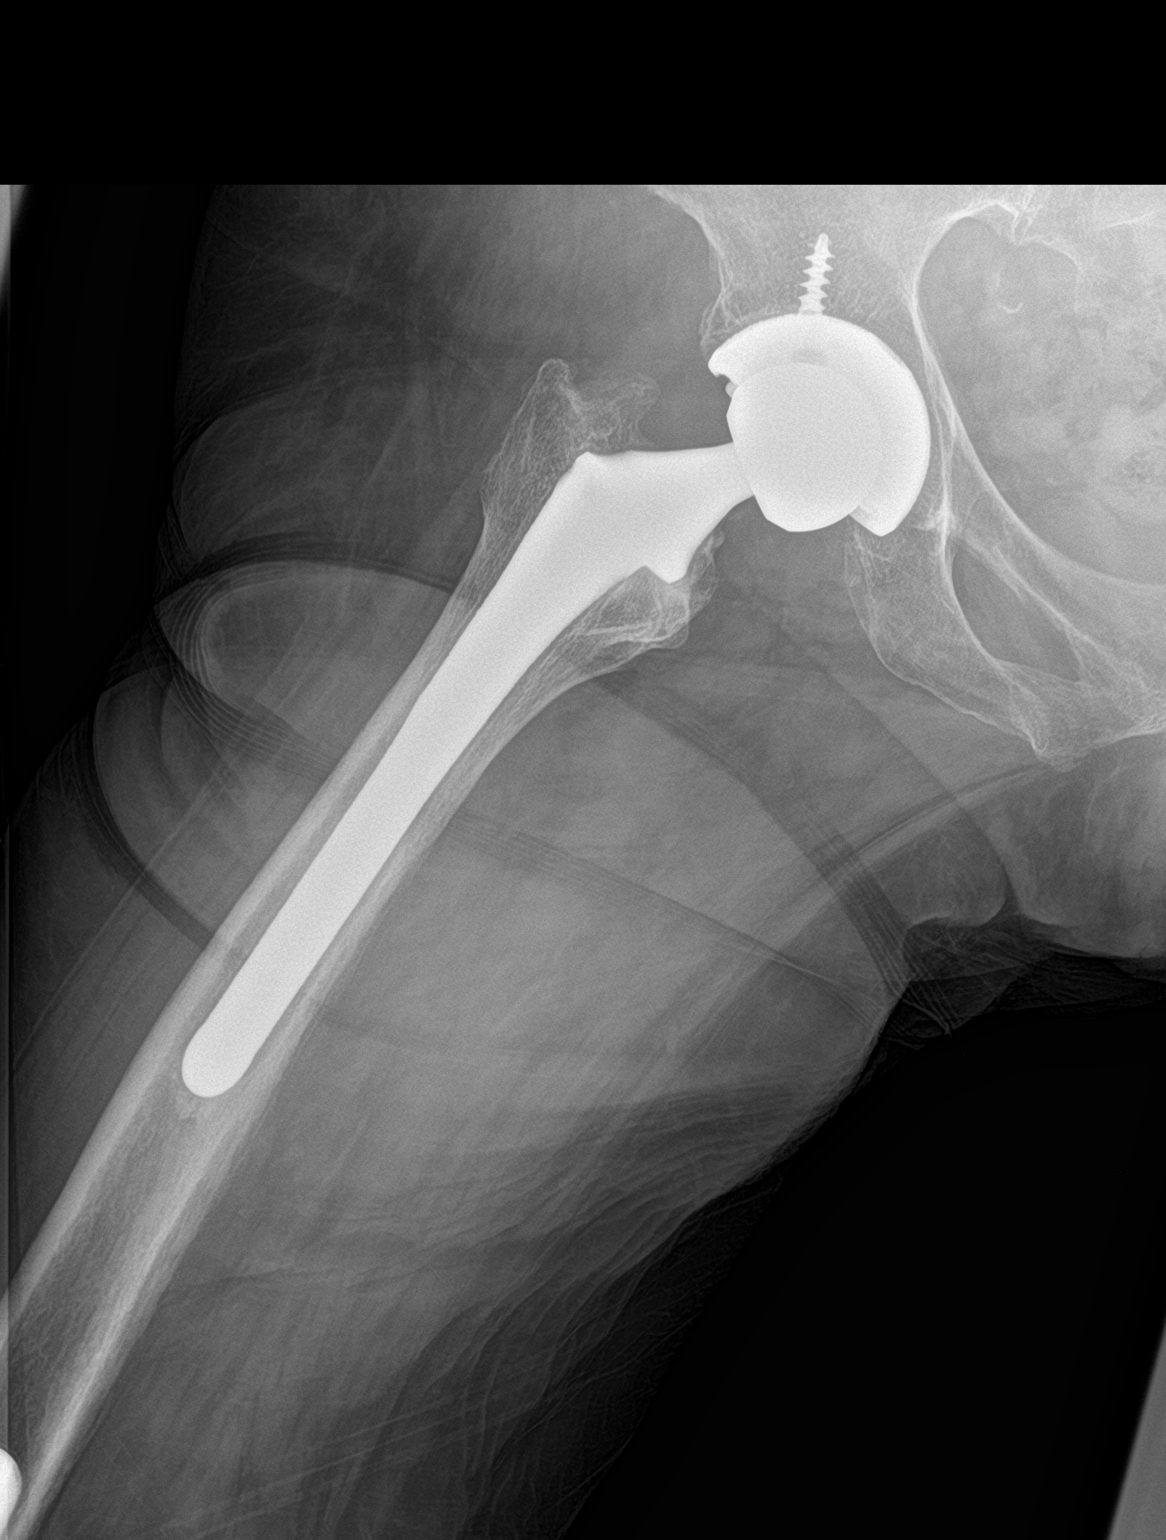

[3 of 3 positions shown; findings below may reference images not displayed]

FINDINGS: Status post right hip replacement. Acetabular and femoral components
are in good position. No acute fractures. No other acute
abnormalities. No dislocation.
IMPRESSION: Status post right hip replacement.  No acute abnormality.

## 2020-05-08 ENCOUNTER — Ambulatory Visit: Payer: Medicare Other

## 2020-05-09 ENCOUNTER — Ambulatory Visit: Payer: Medicare Other

## 2020-05-10 ENCOUNTER — Ambulatory Visit: Payer: Medicare Other

## 2020-05-11 ENCOUNTER — Ambulatory Visit: Payer: Medicare Other

## 2020-05-12 ENCOUNTER — Ambulatory Visit: Payer: Medicare Other

## 2020-05-15 ENCOUNTER — Ambulatory Visit: Payer: Medicare Other

## 2020-05-15 IMAGING — DX CHEST - 2 VIEW
2 series · 2 of 2 positions shown · non-contrast
Comparison: CT 05/17/2018.  Chest x-ray 05/17/2018, 04/22/2018.

CLINICAL DATA: Fever and cough.

EXAM:
CHEST - 2 VIEW

[chest pa]
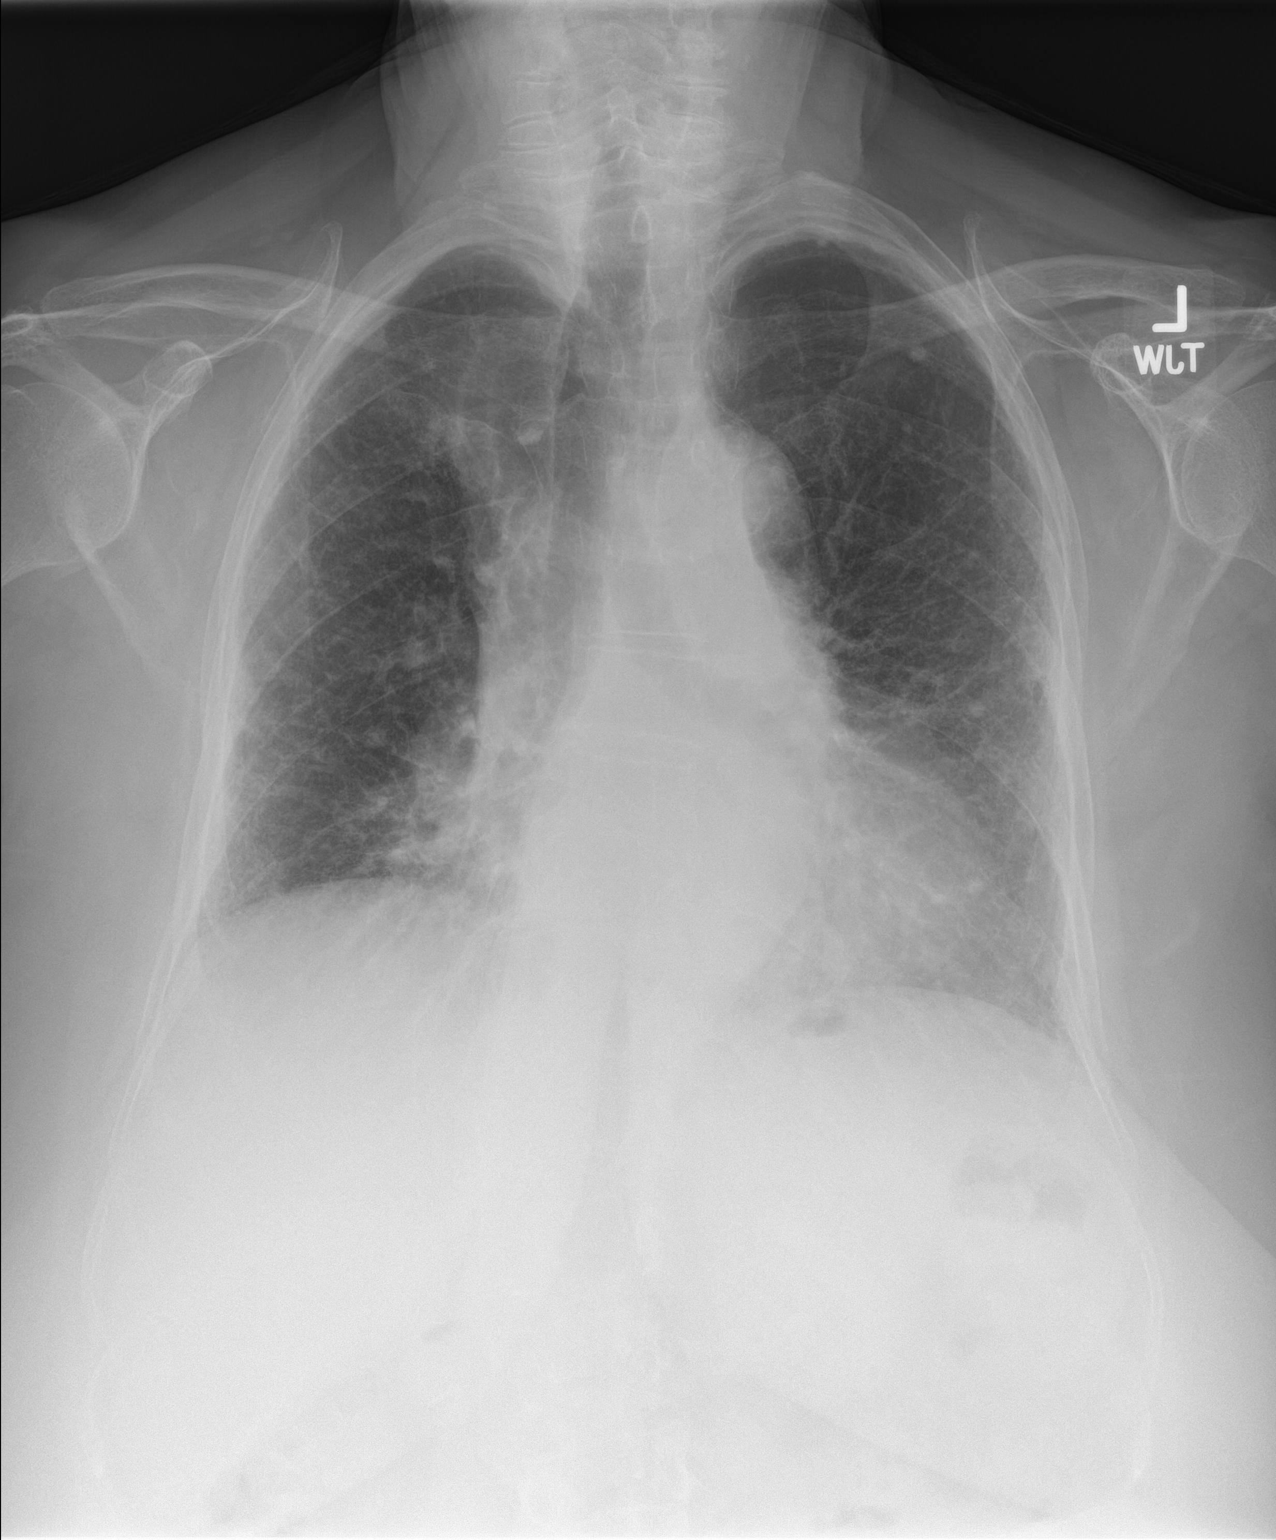

[chest lat]
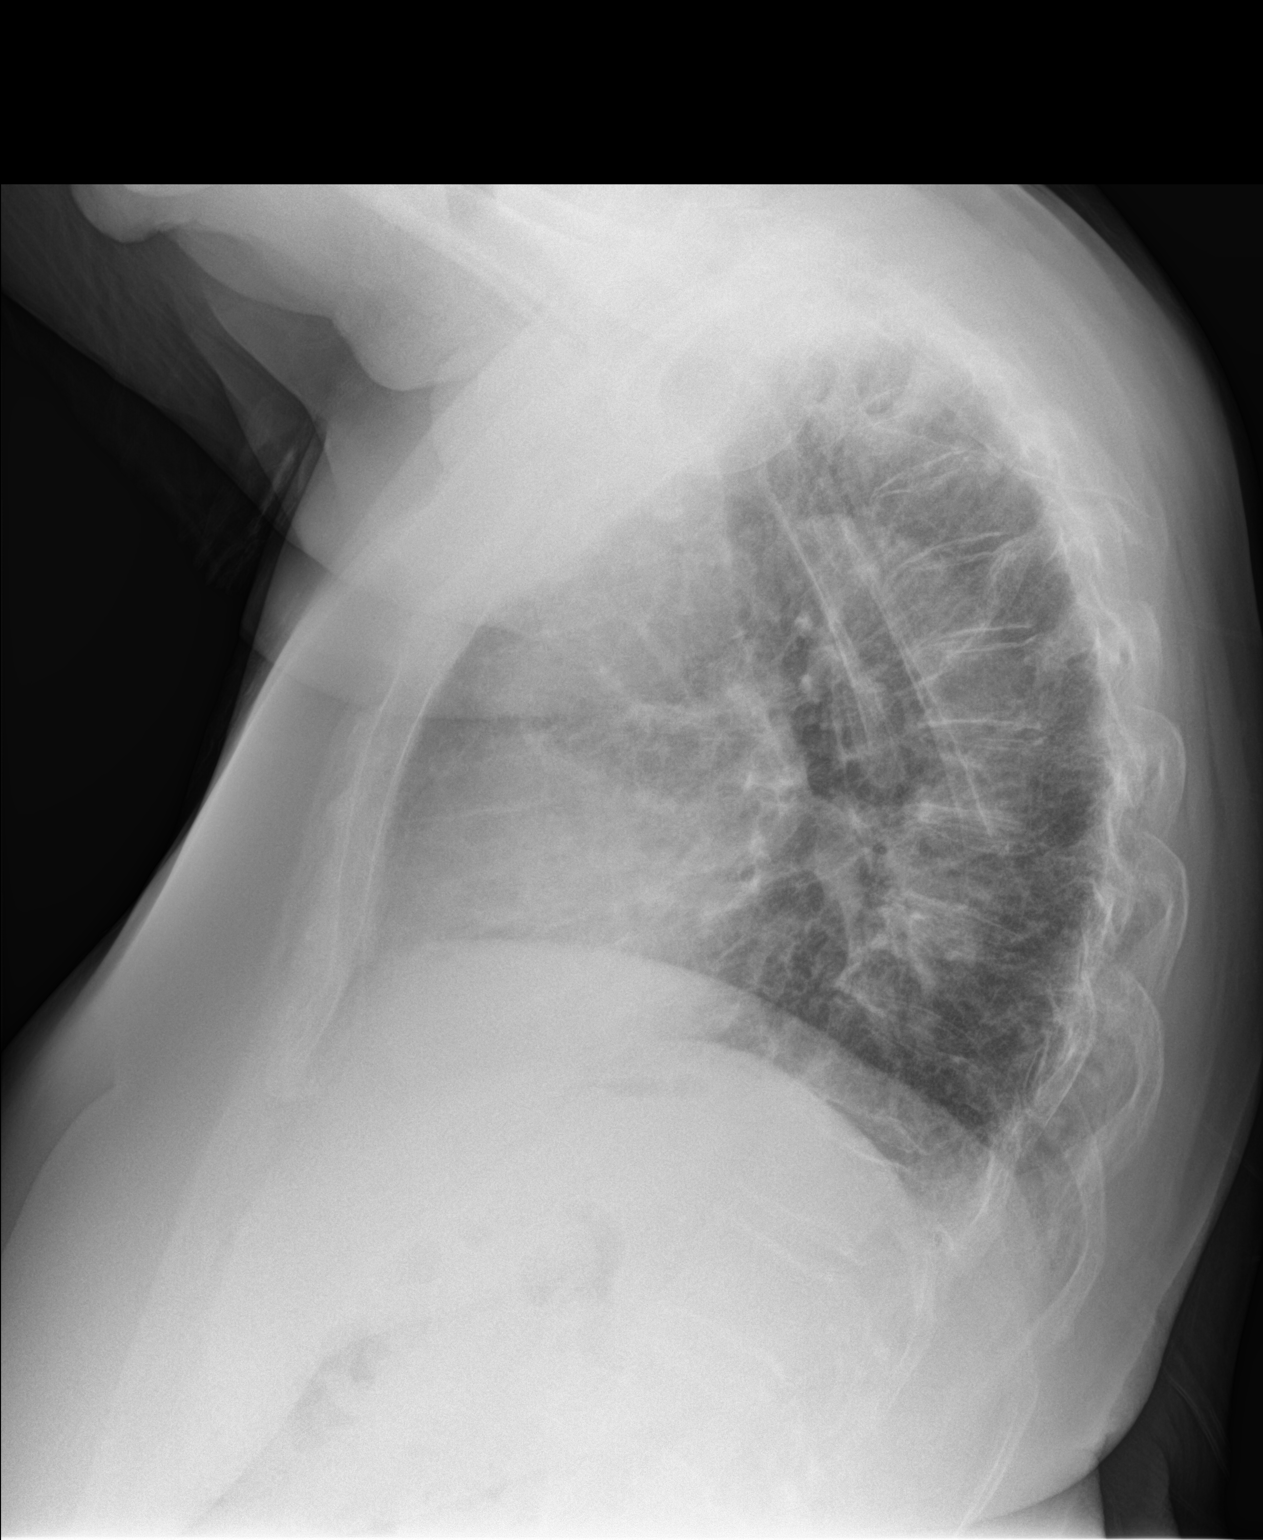

[2 of 2 positions shown; findings below may reference images not displayed]

FINDINGS: Mediastinum and hilar structures normal. Cardiomegaly. Chronic
bilateral interstitial changes are noted. Active superimposed
interstitial process can not be completely excluded. Calcified
pulmonary nodules noted consistent granulomas. Degenerative change
thoracic spine.
IMPRESSION: 1.  Cardiomegaly.  No pulmonary venous congestion.

2. Chronic bilateral interstitial changes consistent chronic
interstitial lung disease. Similar findings noted on prior exam.
Superimposed active interstitial process such as pneumonitis can not
be completely excluded. Stable calcified pulmonary nodules
consistent with old granulomas disease.

## 2020-05-16 ENCOUNTER — Ambulatory Visit: Payer: Medicare Other

## 2020-05-17 ENCOUNTER — Ambulatory Visit: Payer: Medicare Other

## 2020-05-18 ENCOUNTER — Ambulatory Visit: Payer: Medicare Other

## 2020-05-22 ENCOUNTER — Ambulatory Visit: Payer: Medicare Other

## 2020-05-23 ENCOUNTER — Ambulatory Visit: Payer: Medicare Other

## 2020-05-24 ENCOUNTER — Ambulatory Visit: Payer: Medicare Other

## 2020-05-25 ENCOUNTER — Ambulatory Visit: Payer: Medicare Other

## 2020-05-27 NOTE — Telephone Encounter (Signed)
I called and spoke with patient's daughter, [name], to express my condolences.

## 2020-05-27 NOTE — Telephone Encounter (Signed)
[name] the daughter called. Mrs. Dinius passed away this morning at 6:00am. She wanted to let you know and wanted [place] to know how much she appreciated you and how much her mother loved coming to see you. EM

## 2020-05-27 DEATH — deceased

## 2020-05-29 ENCOUNTER — Ambulatory Visit: Payer: Medicare Other

## 2020-05-30 ENCOUNTER — Ambulatory Visit: Payer: Medicare Other

## 2020-05-31 ENCOUNTER — Ambulatory Visit: Payer: Medicare Other

## 2020-06-01 ENCOUNTER — Ambulatory Visit: Payer: Medicare Other

## 2020-06-02 ENCOUNTER — Ambulatory Visit: Payer: Medicare Other

## 2020-06-05 ENCOUNTER — Ambulatory Visit: Payer: Medicare Other

## 2020-06-06 ENCOUNTER — Ambulatory Visit: Payer: Medicare Other

## 2020-06-23 IMAGING — US US EXTREM LOW*R* LIMITED
1 series · 13 of 13 positions shown · non-contrast
Comparison: CT 02/17/2018

CLINICAL DATA: Right hip pain.  Prior surgery.

EXAM:
ULTRASOUND RIGHT LOWER EXTREMITY LIMITED
TECHNIQUE: Ultrasound examination of the lower extremity soft tissues was
performed in the area of clinical concern.

[Series 1: us extrem low*right* limited · 0.09mm/px · 13 of 13 slices shown]
[im 1/13]
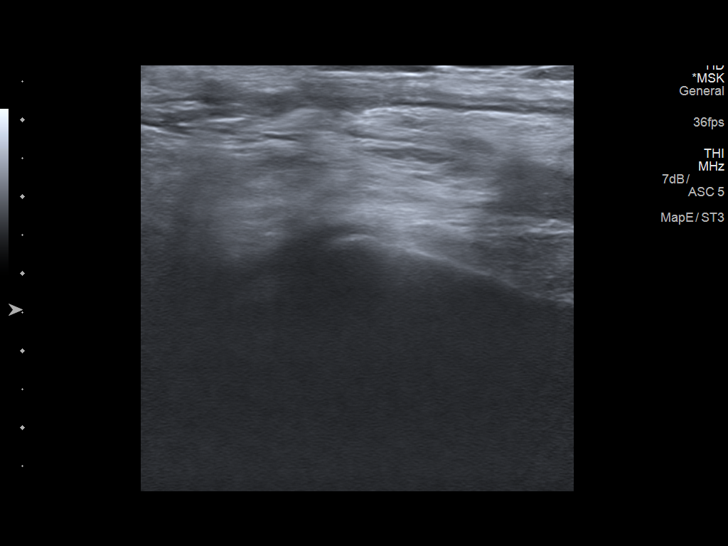
[im 2/13]
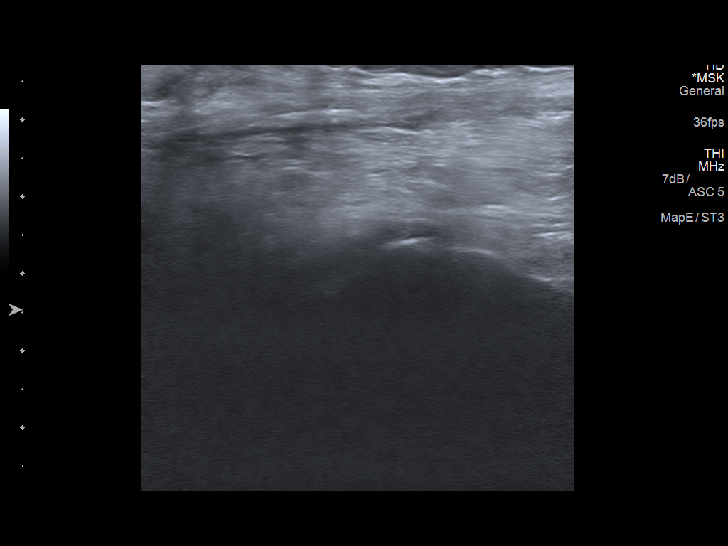
[im 3/13]
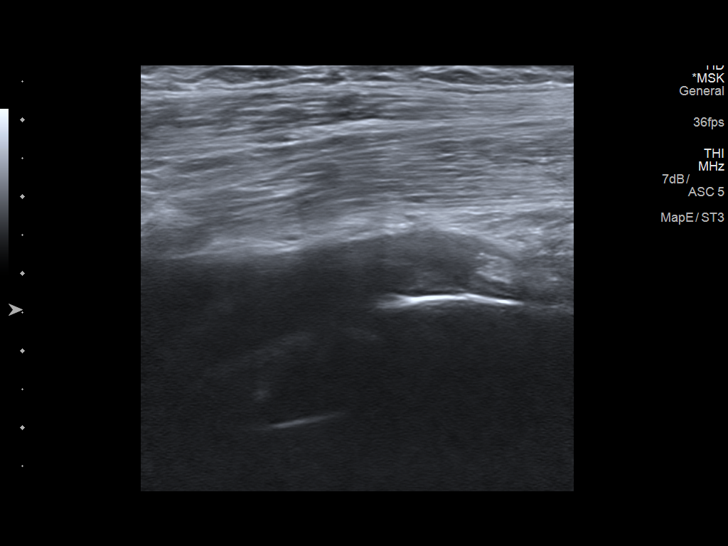
[im 4/13]
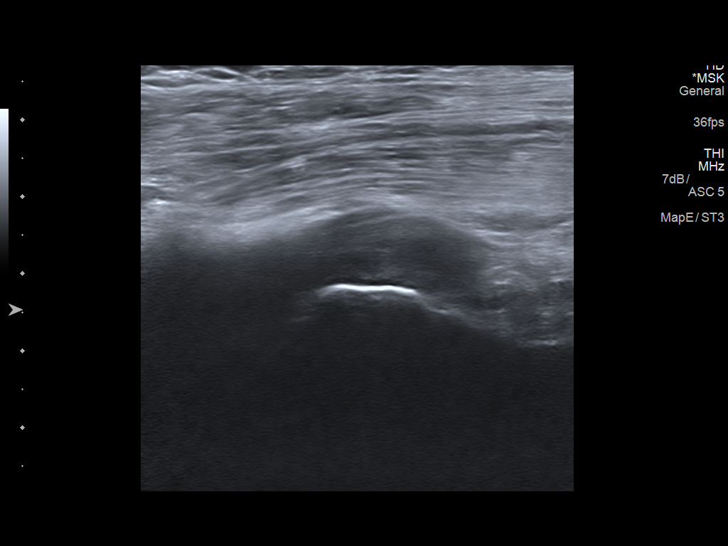
[im 5/13]
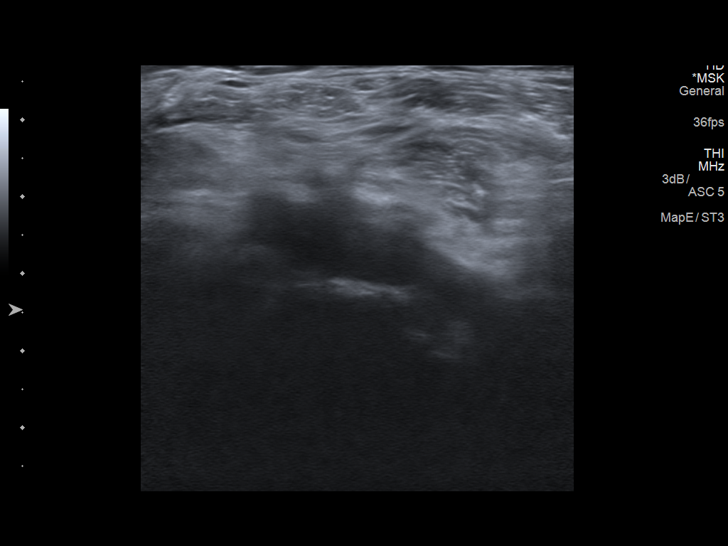
[im 6/13]
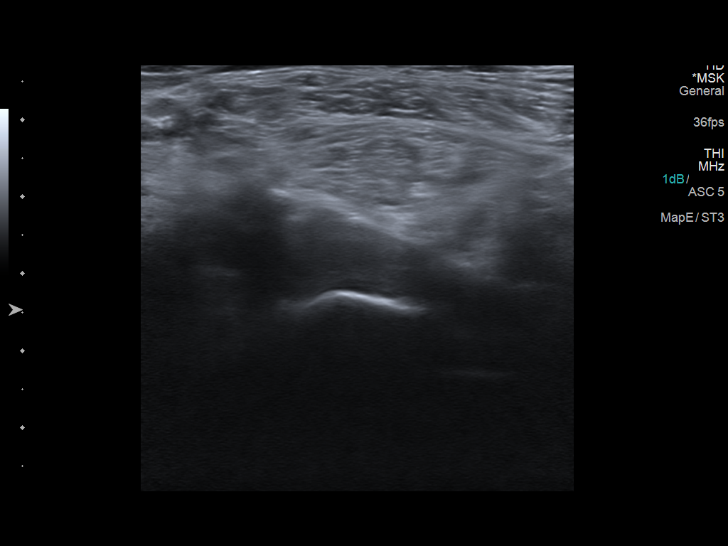
[im 7/13]
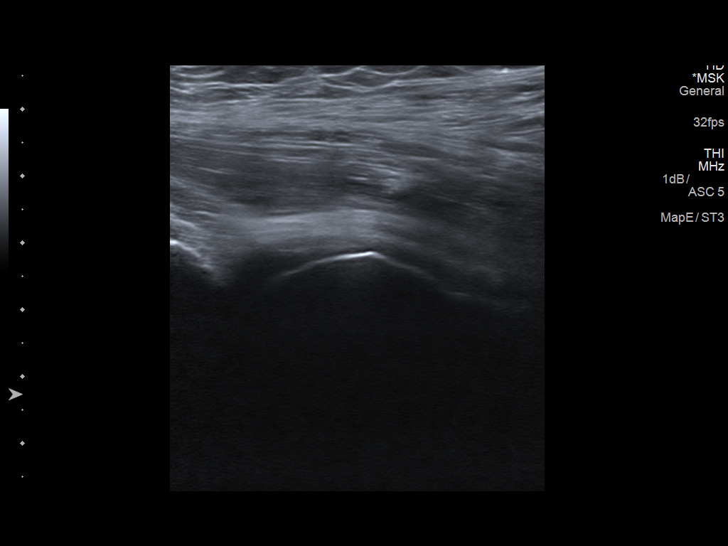
[im 8/13]
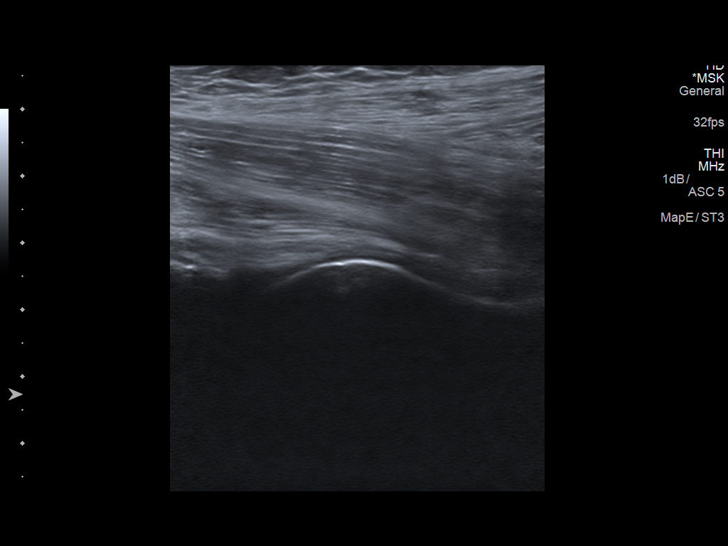
[im 9/13]
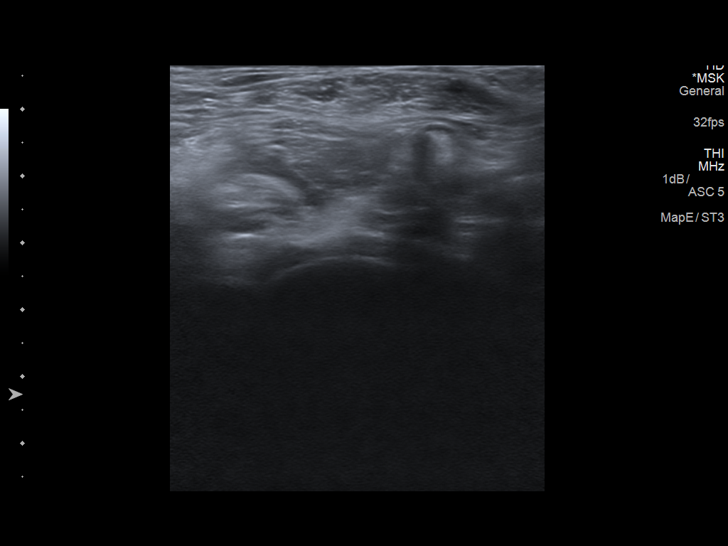
[im 10/13]
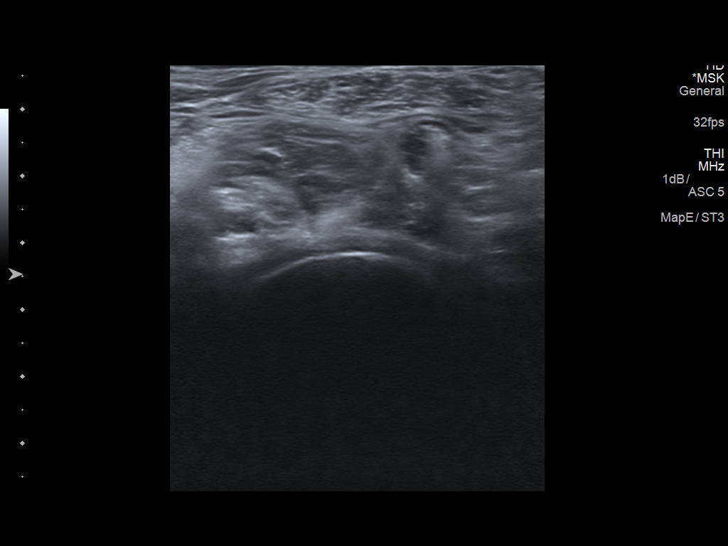
[im 11/13]
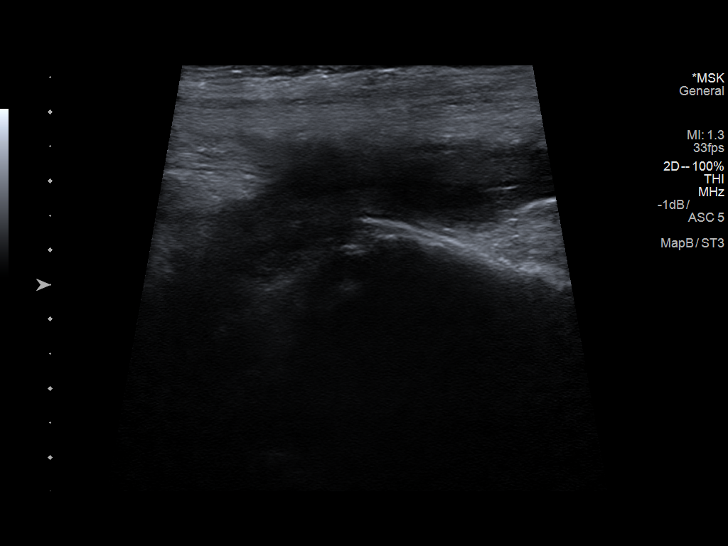
[im 12/13]
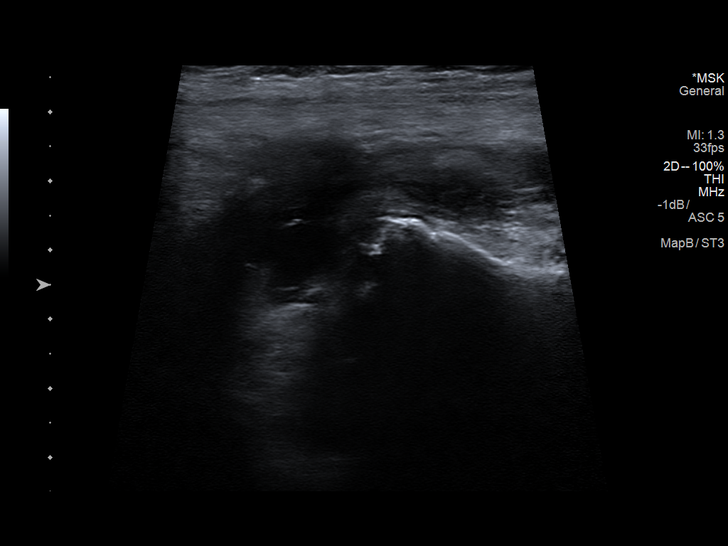
[im 13/13]
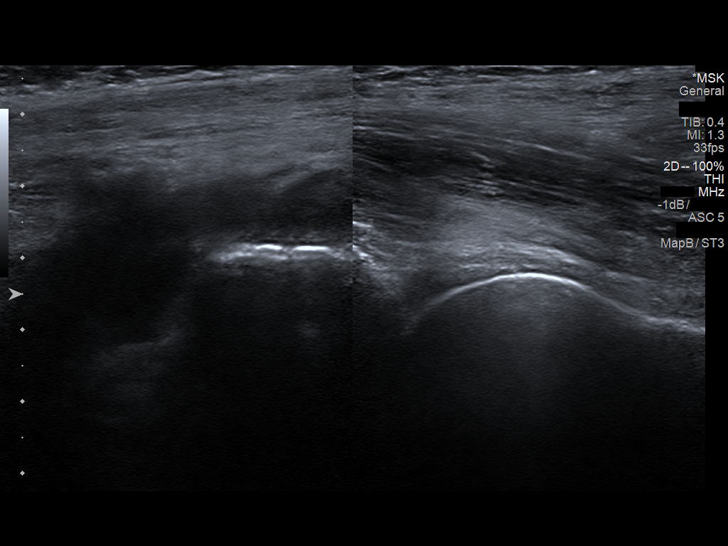

[13 of 13 positions shown; findings below may reference images not displayed]

FINDINGS: Moderate-sized right hip effusion is noted. This correlates with CT
findings. Comparison view left hip is unremarkable.
IMPRESSION: Moderate size right hip effusion.

## 2020-08-02 IMAGING — DX PORTABLE CHEST - 1 VIEW
1 series · 1 of 1 positions shown · non-contrast
Comparison: 08/11/2018

CLINICAL DATA: Vomiting blood

EXAM:
PORTABLE CHEST 1 VIEW

[chest ap]
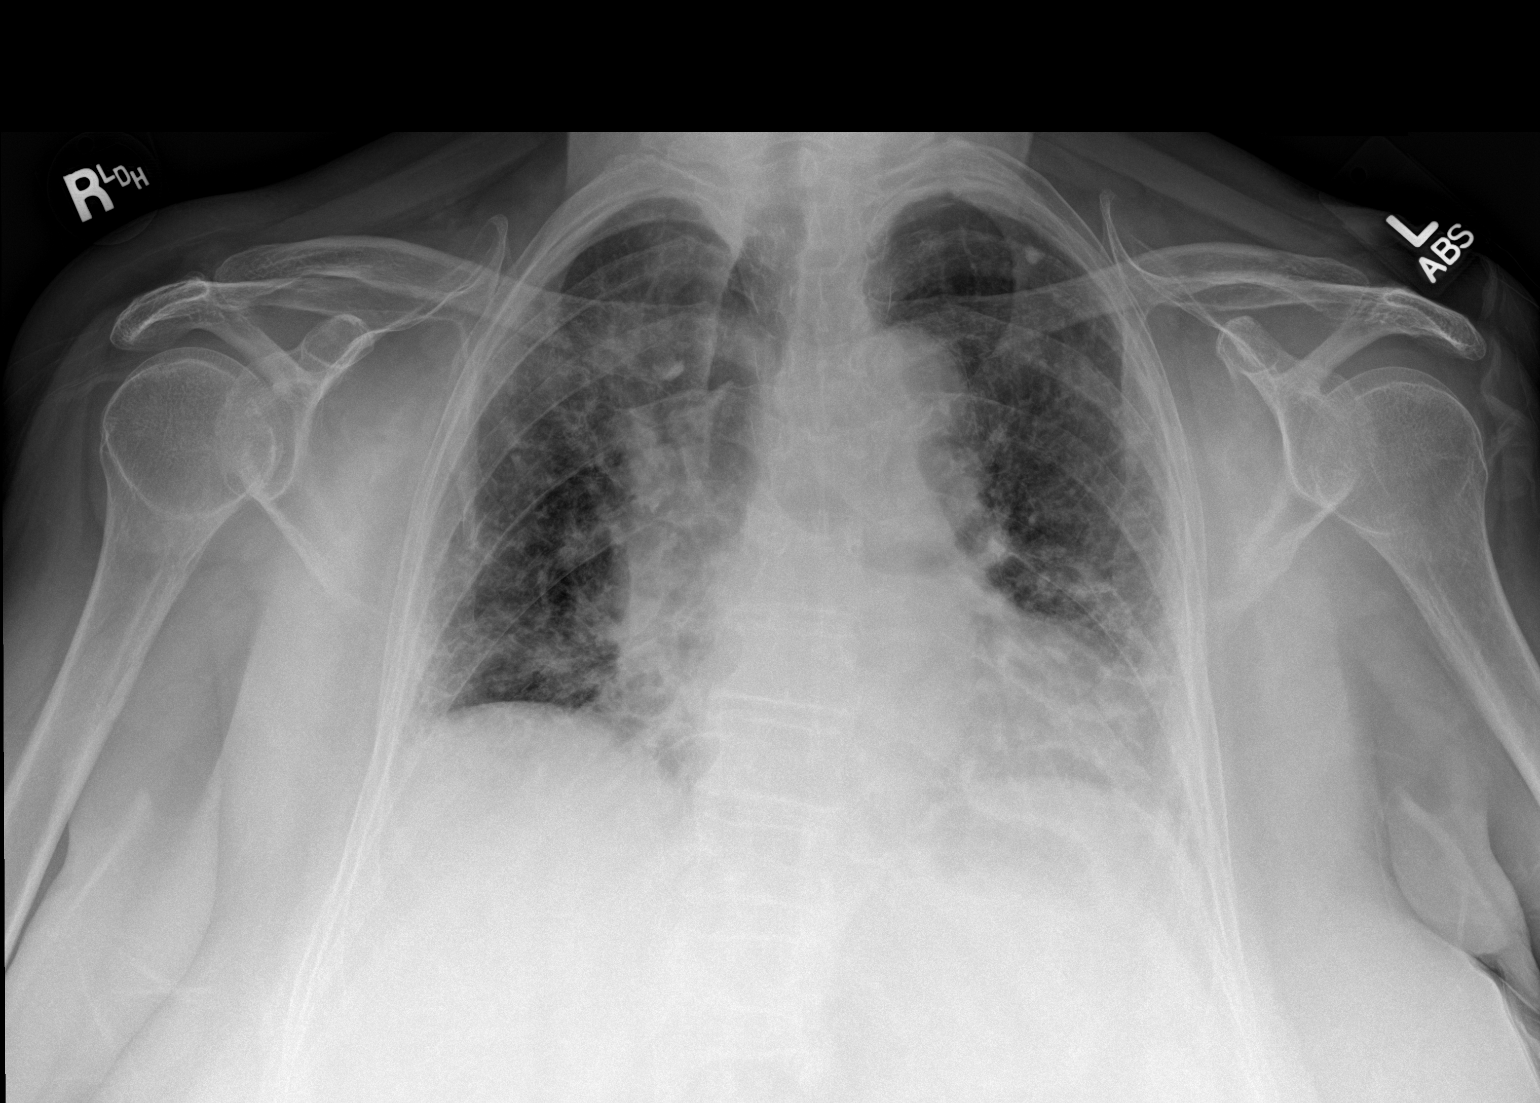

[1 of 1 positions shown; findings below may reference images not displayed]

FINDINGS: Low volume AP portable examination with redemonstrated bibasilar
predominant fibrotic opacity. There is no acute appearing airspace
opacity. Mild cardiomegaly.
IMPRESSION: Low volume AP portable examination with redemonstrated bibasilar
predominant fibrotic opacity. There is no acute appearing airspace
opacity. Mild cardiomegaly.

## 2020-09-11 IMAGING — CT NUCLEAR MEDICINE THREE PHASE BONE SCAN
1 series · 12 of 14 positions shown, 15 images · non-contrast
Comparison: Radiograph 08/02/2018, bone scan 10

CLINICAL DATA: Partial RIGHT hip replacement 0709. RIGHT hip pain.
RIGHT knee replacement.

EXAM:
NUCLEAR MEDICINE 3-PHASE BONE SCAN with SPECT
TECHNIQUE: Radionuclide angiographic images, immediate static blood pool
images, and 3-hour delayed static images were obtained of the hips
after intravenous injection of radiopharmaceutical.
RADIOPHARMACEUTICALS:  20.4 mCi 4c-88m MDP IV

[Series 4: 3d bone 1.25 b70s · axial · 0.98mm/px · z∈[+1031,+1351]mm · 12 of 541 slices shown, 15 images]
[im 42/541  soft-tissue]
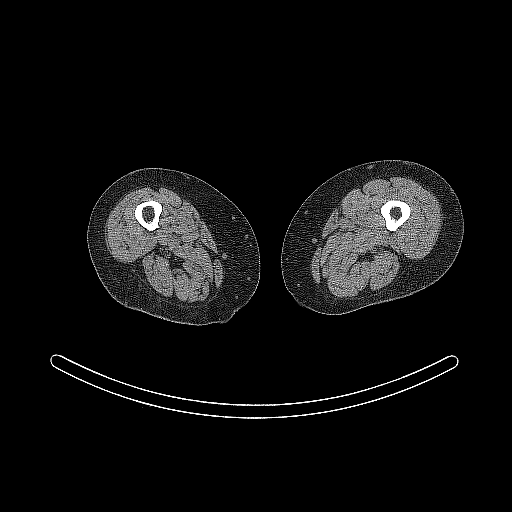
[im 42/541  bone]
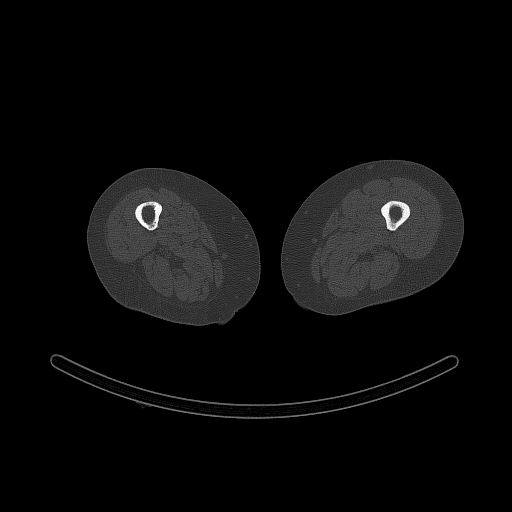
[im 84/541  bone]
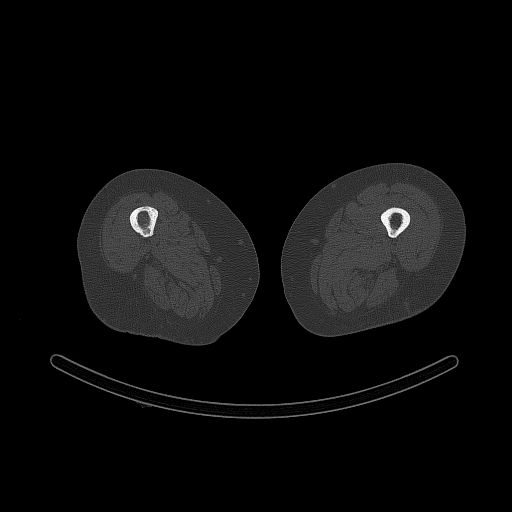
[im 125/541  bone]
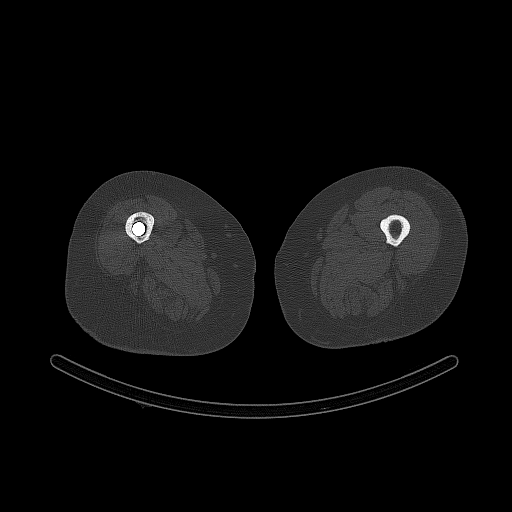
[im 167/541  bone]
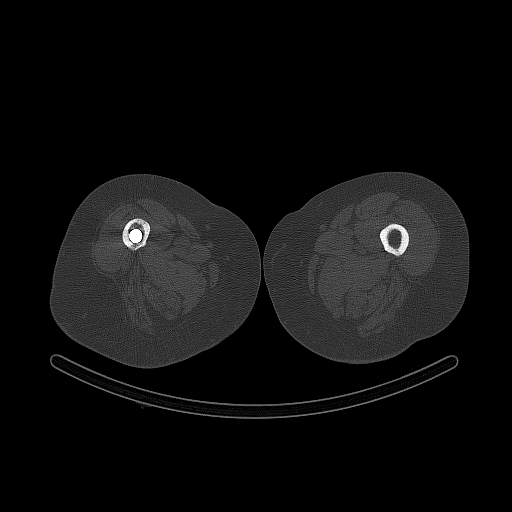
[im 208/541  soft-tissue]
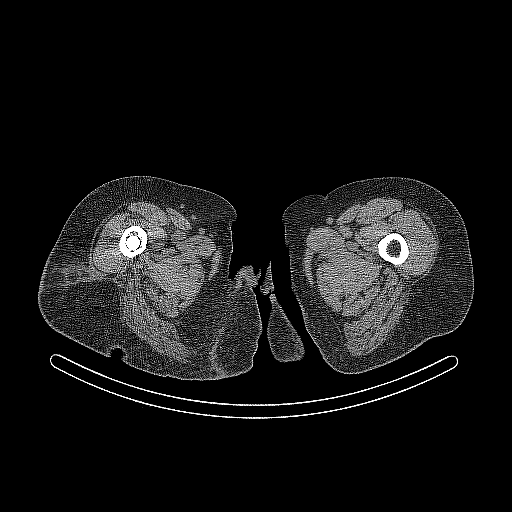
[im 208/541  bone]
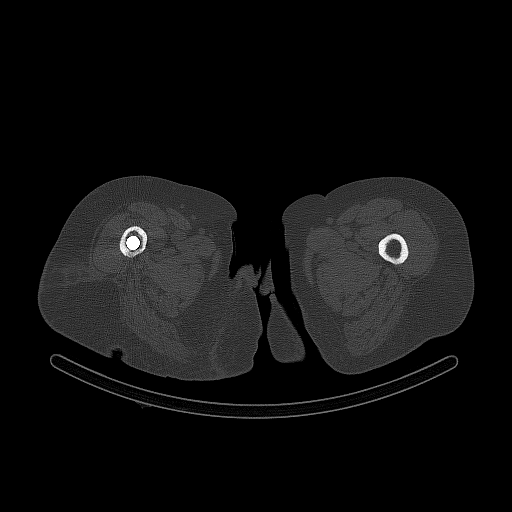
[im 250/541  bone]
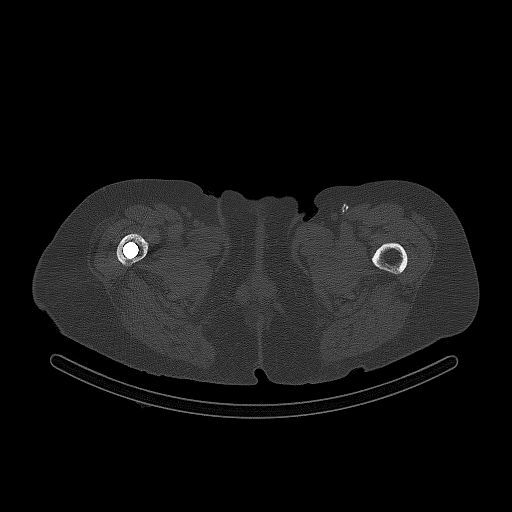
[im 291/541  bone]
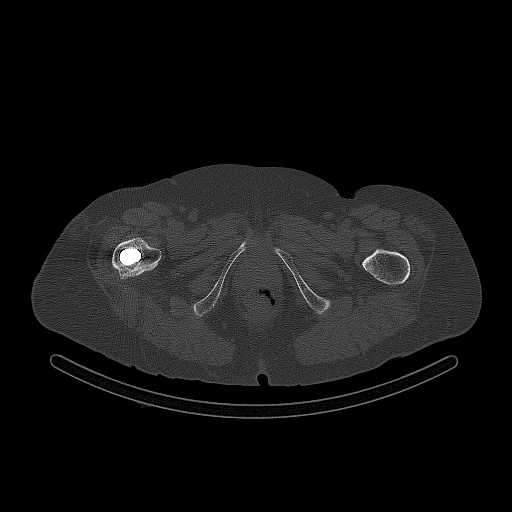
[im 333/541  bone]
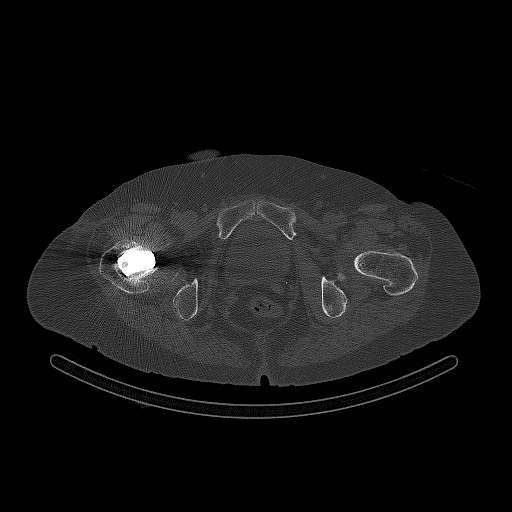
[im 374/541  soft-tissue]
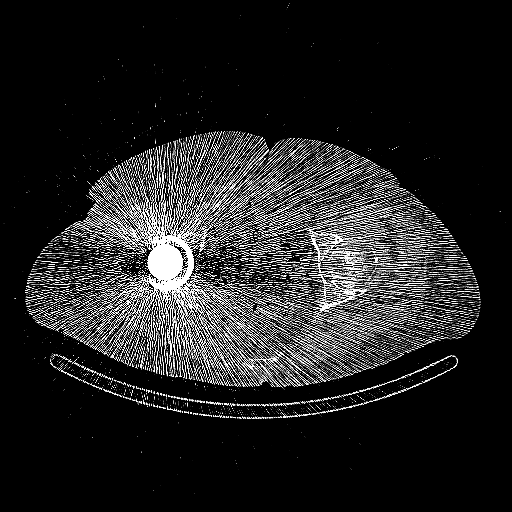
[im 374/541  bone]
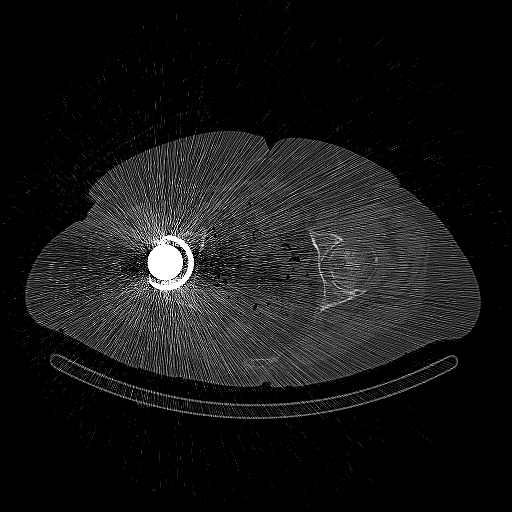
[im 416/541  bone]
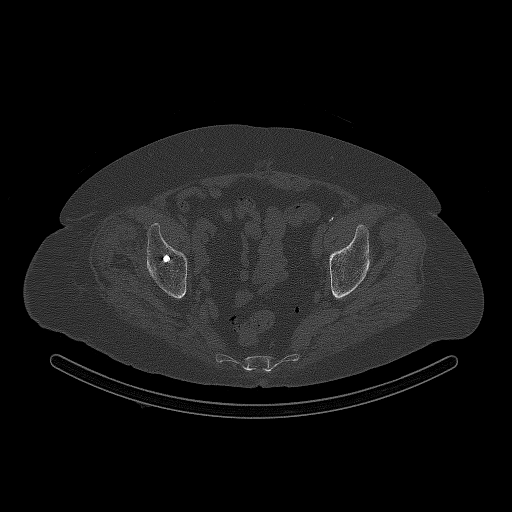
[im 457/541  bone]
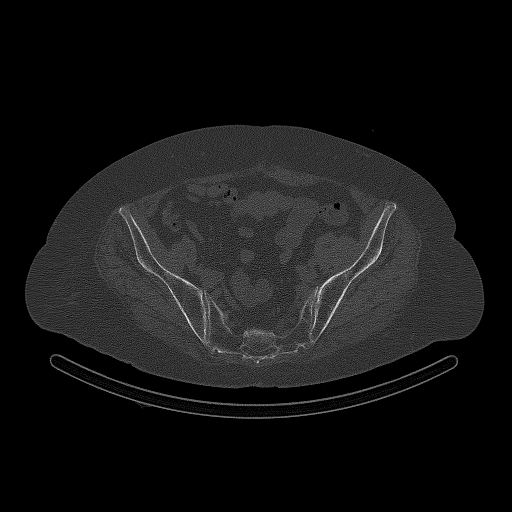
[im 499/541  bone]
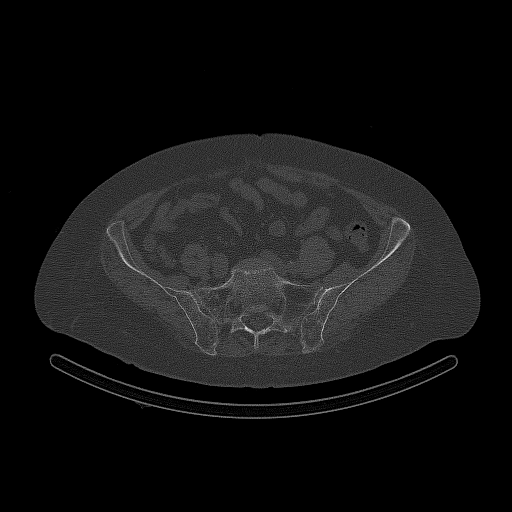

[12 of 14 positions shown; findings below may reference images not displayed]

FINDINGS: Vascular phase: No asymmetric or increased blood flow to the LEFT or
RIGHT hip.

Blood pool phase: Increased uptake at the tip of the RIGHT hip
prosthetic within the femoral diaphysis.

Delayed phase: Delayed focal uptake in the tip of the RIGHT hip
prosthetic within the RIGHT femoral diaphysis. This is similar to
comparison bone scan. Mild uptake at the osteotomy margin in the
intertrochanteric region of the RIGHT hip prosthetic.
IMPRESSION: Concern for prosthetic loosening with focal activity at the tip of
the RIGHT hip prosthetic. Findings similar to three-phase nuclear
medicine bone scan 04/06/2018.

## 2021-01-21 IMAGING — DX DG TIBIA/FIBULA 2V*R*
4 series · 4 of 4 positions shown · non-contrast
Comparison: Knee radiographs 05/25/2018

CLINICAL DATA: Right leg pain and injury

EXAM:
RIGHT TIBIA AND FIBULA - 2 VIEW

[tibia ap (1 of 2)]
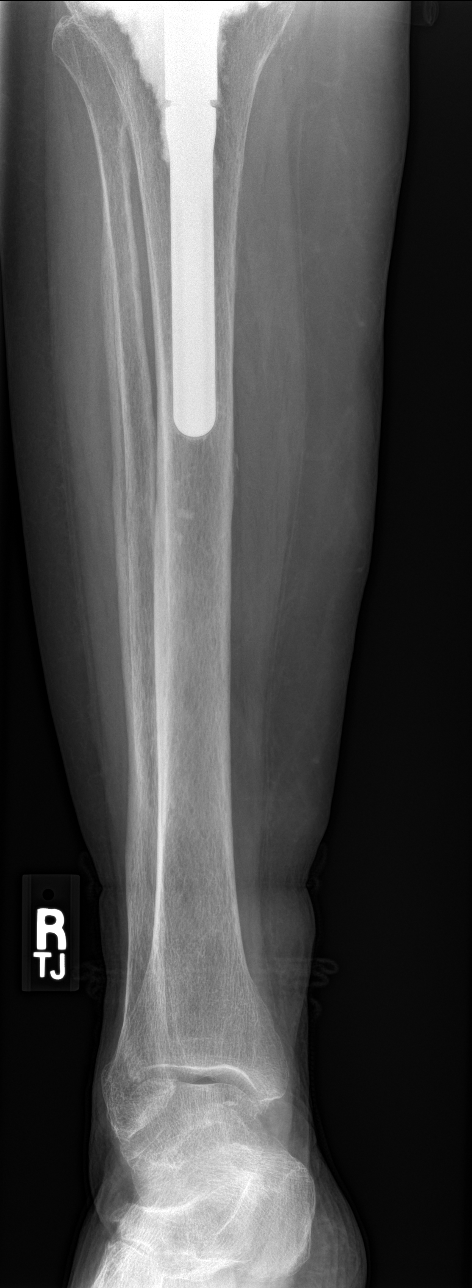

[tibia ap (2 of 2)]
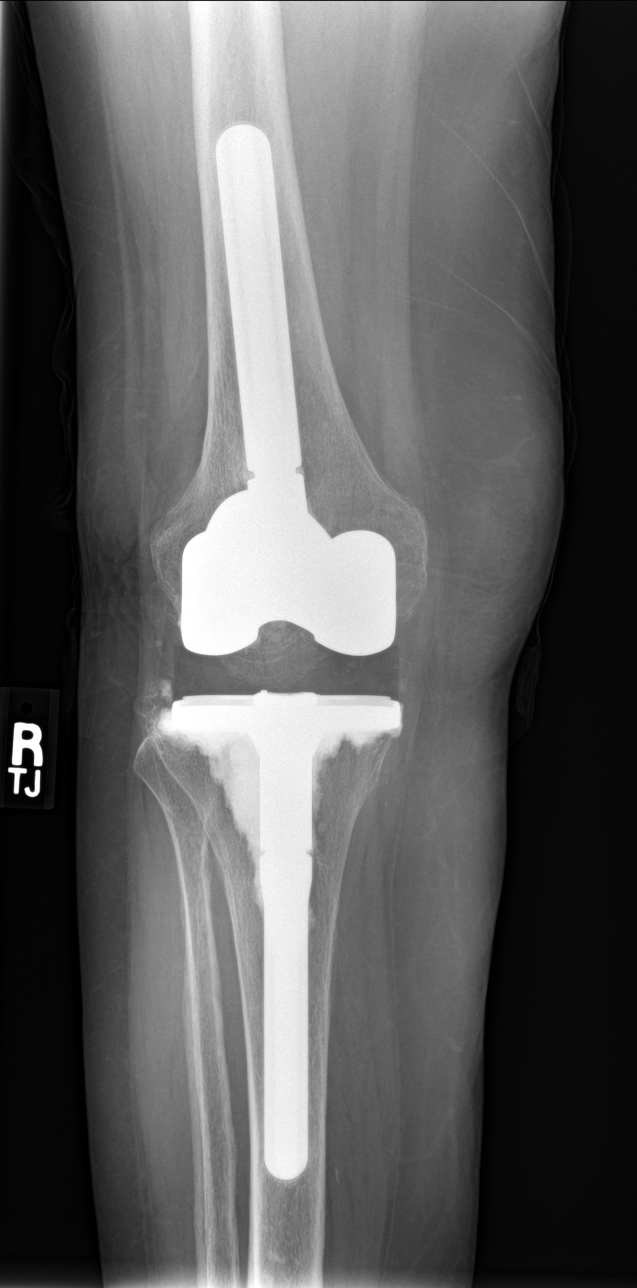

[tibia lat (1 of 2)]
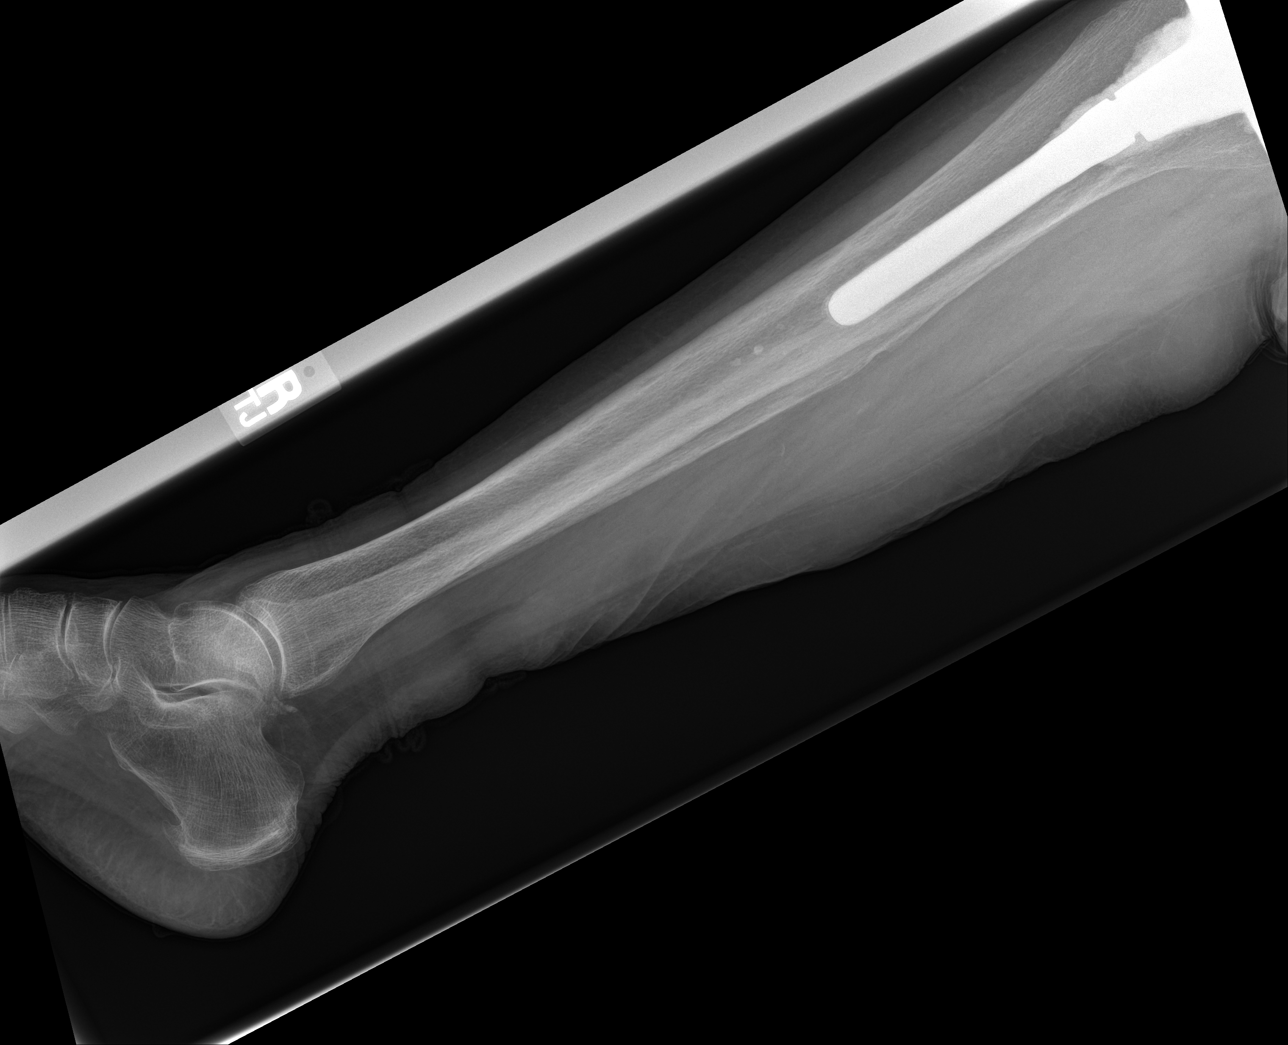

[tibia lat (2 of 2)]
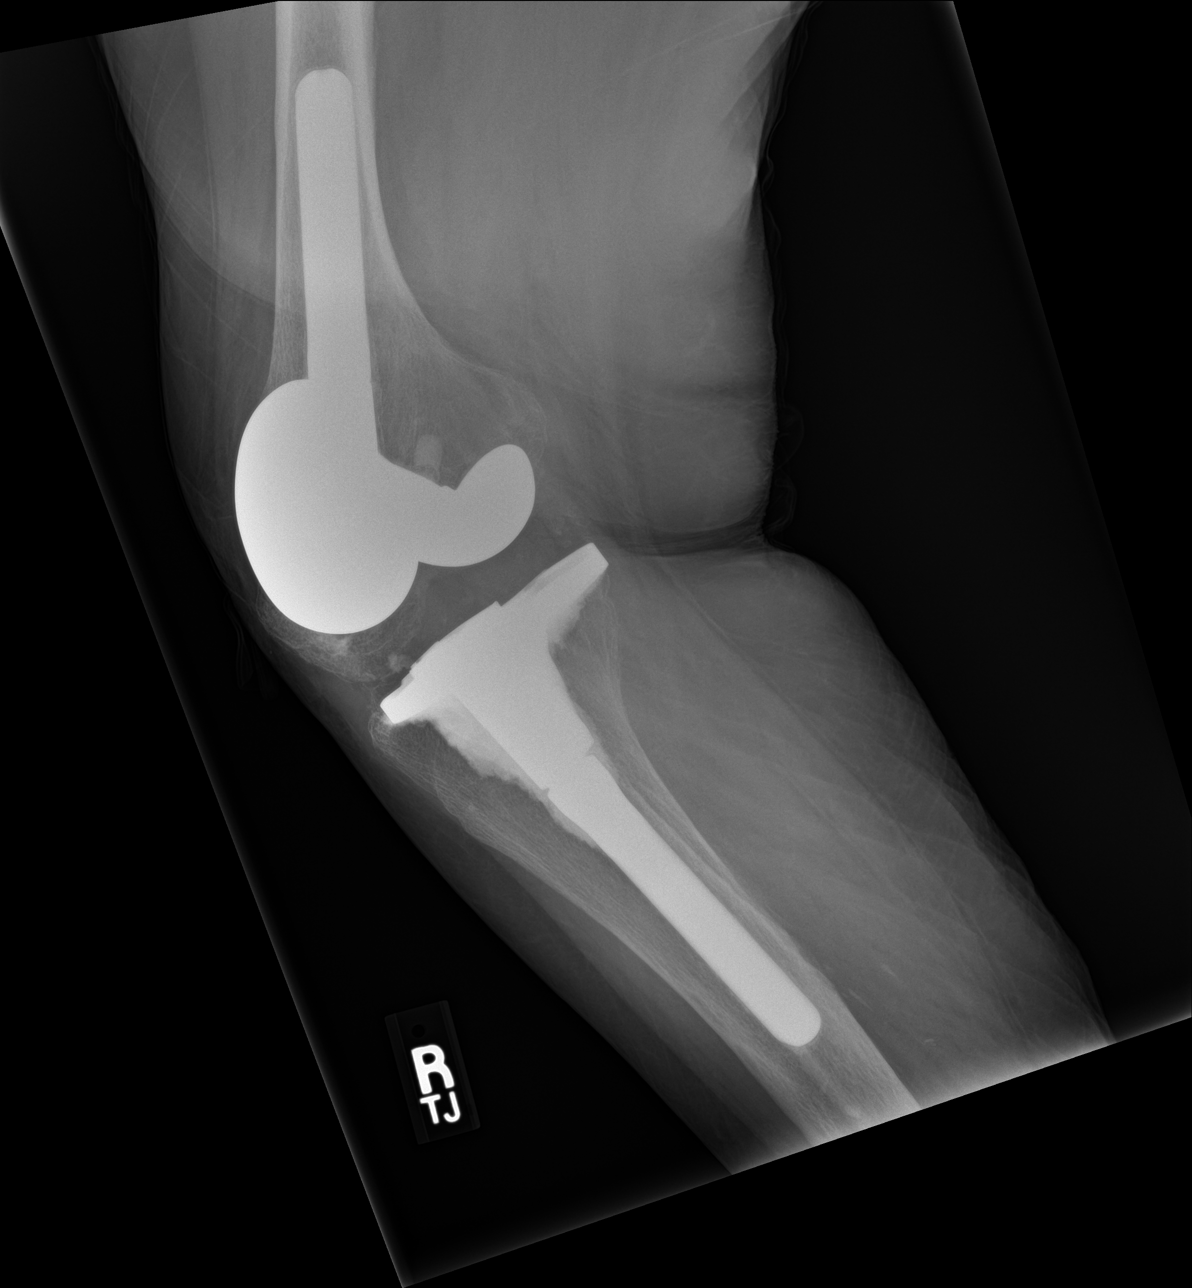

[4 of 4 positions shown; findings below may reference images not displayed]

FINDINGS: Prior total right knee arthroplasty with grossly normal alignment of
the articular components, incompletely evaluated due to suboptimal
lateral proximal tibia/fibula. Could consider dedicated right knee
radiographs there is concern. The osseous structures appear
diffusely demineralized which may limit detection of small or
nondisplaced fractures. No acute fracture or traumatic osseous
injury. No evidence of hardware failure or features of loosening.
Degenerative changes noted at the ankle and included hindfoot. Mild
edematous soft tissue swelling is noted. No soft tissue gas or
foreign body.
IMPRESSION: 1. Prior total right knee arthroplasty without evidence of hardware
complication.
2. Mild edematous soft tissue swelling. No acute osseous
abnormality.
3. Osseous structures appear diffusely demineralized which may limit
detection of small or nondisplaced fractures.

## 2021-03-22 IMAGING — CT CT RENAL STONE PROTOCOL
2 of 4 series · 16 of 46 positions shown, 18 images · non-contrast
Comparison: 04/30/2018

CLINICAL DATA: Right-sided abdominal pain and right chest wall
pain. Nausea.

EXAM:
CT ABDOMEN AND PELVIS WITHOUT CONTRAST
TECHNIQUE: Multidetector CT imaging of the abdomen and pelvis was performed
following the standard protocol without IV contrast.

[Series 2: stone full standard · axial · 0.74mm/px · z∈[-1032,-597]mm · 13 of 95 slices shown, 15 images]
[im 4/95  soft-tissue]
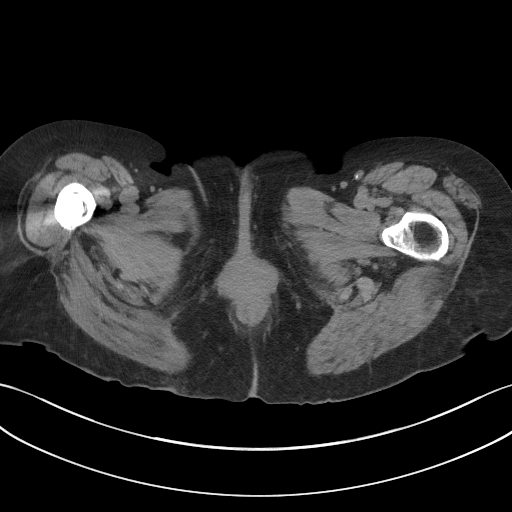
[im 4/95  bone]
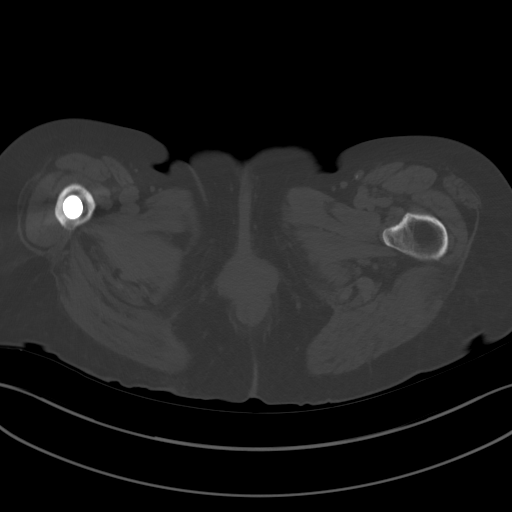
[im 12/95  soft-tissue]
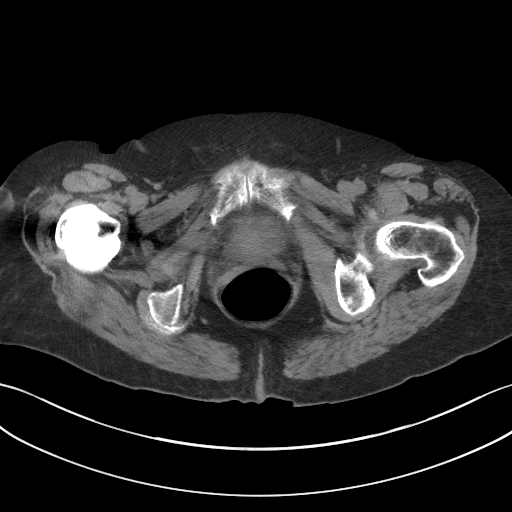
[im 20/95  soft-tissue]
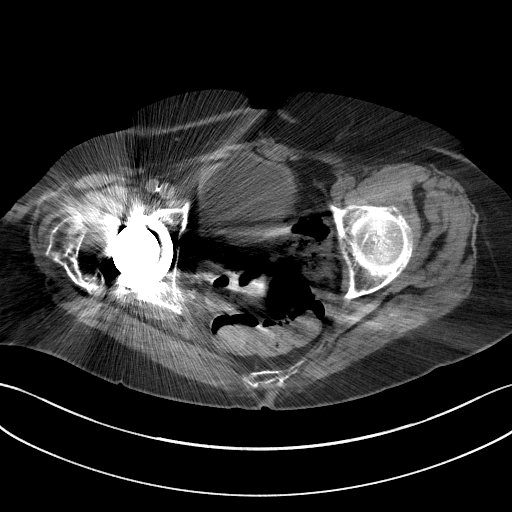
[im 28/95  soft-tissue]
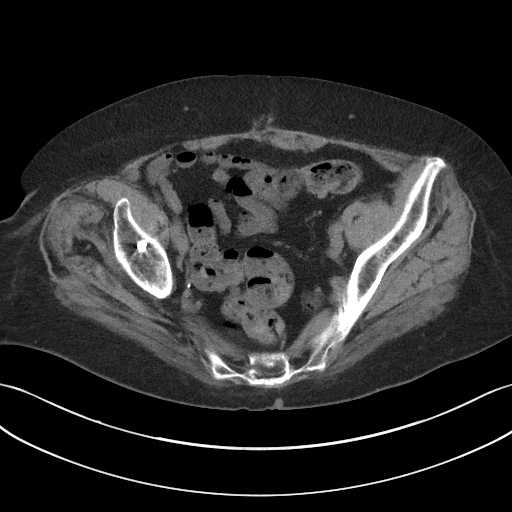
[im 32/95  soft-tissue]
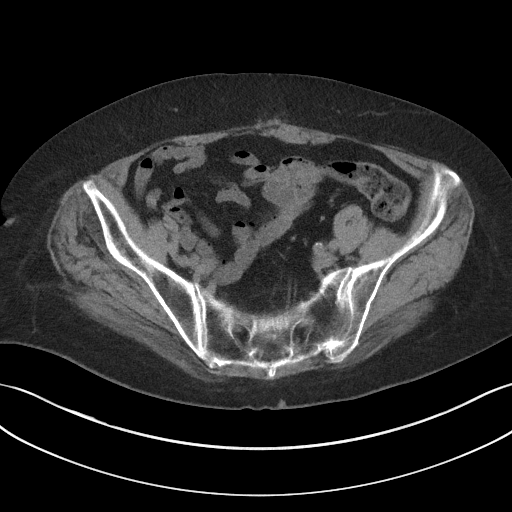
[im 40/95  soft-tissue]
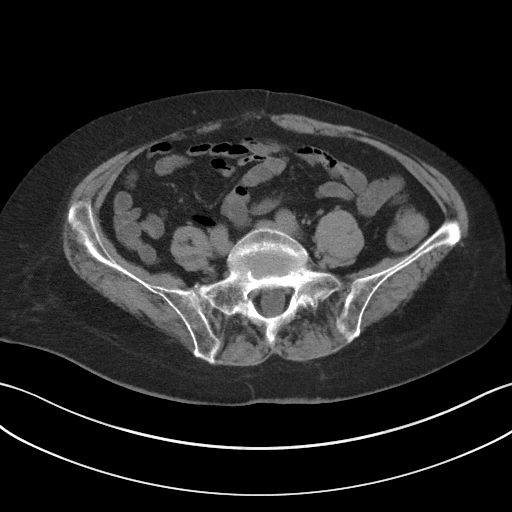
[im 48/95  soft-tissue]
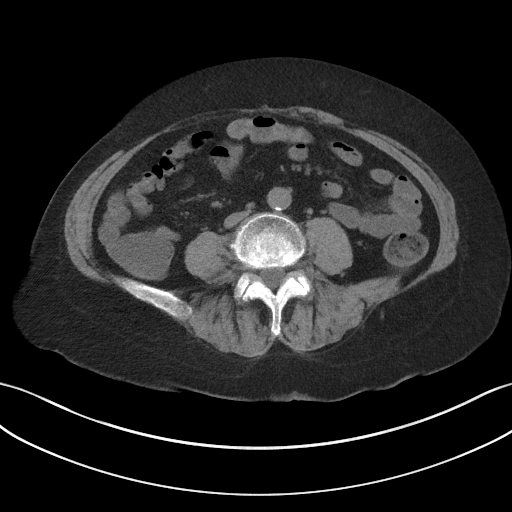
[im 55/95  soft-tissue]
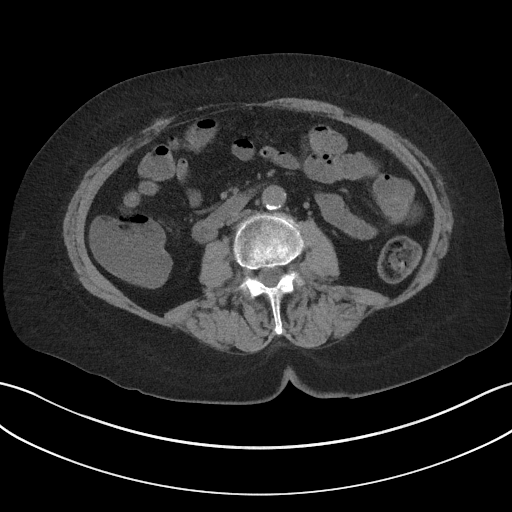
[im 63/95  soft-tissue]
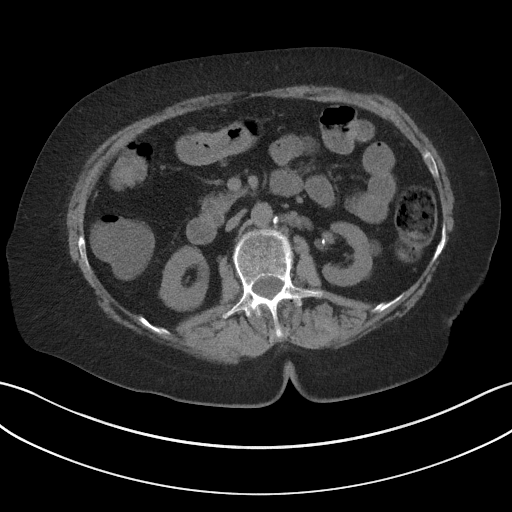
[im 63/95  bone]
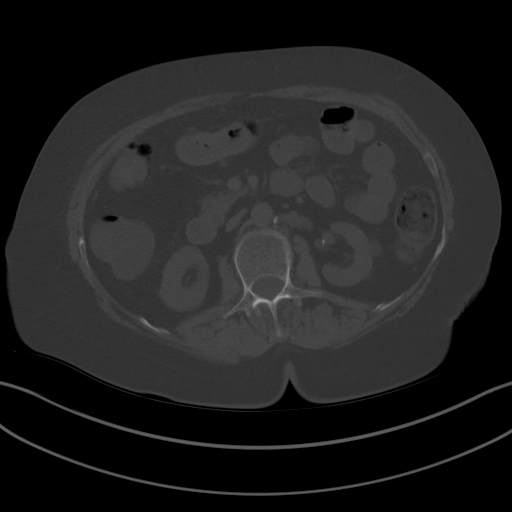
[im 67/95  soft-tissue]
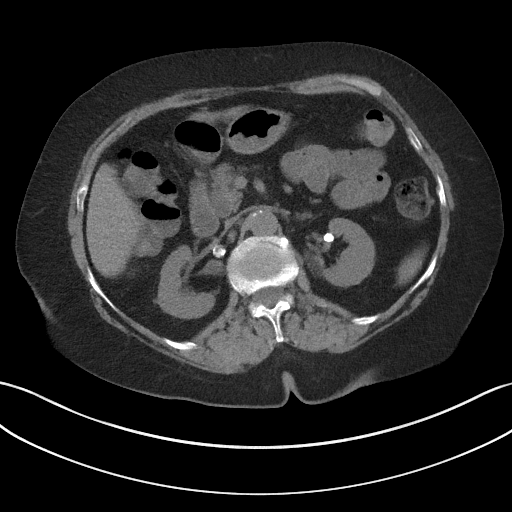
[im 75/95  soft-tissue]
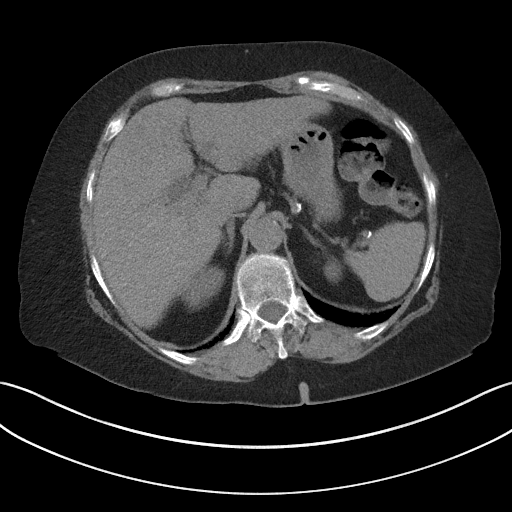
[im 83/95  soft-tissue]
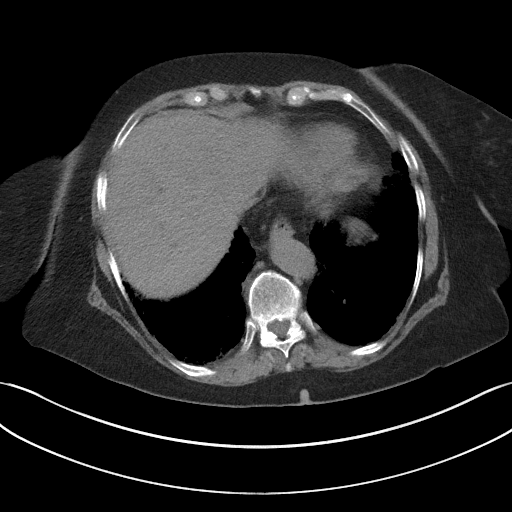
[im 91/95  soft-tissue]
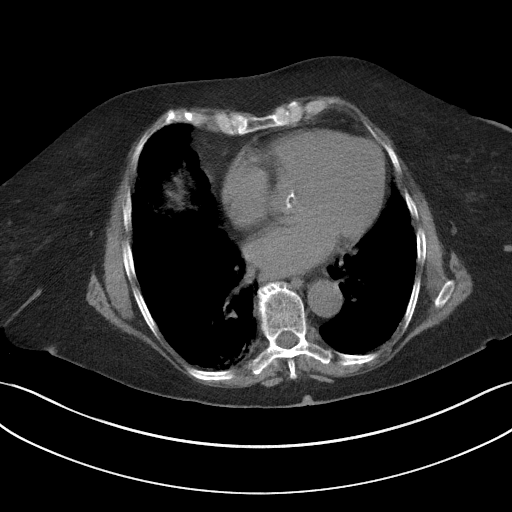

[Series 5: coronal · coronal · 0.76mm/px · 3 of 120 slices shown]
[im 40/120  soft-tissue]
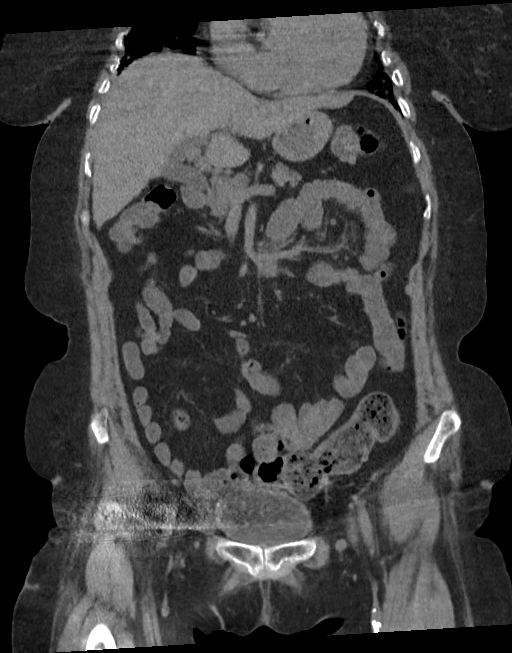
[im 53/120  soft-tissue]
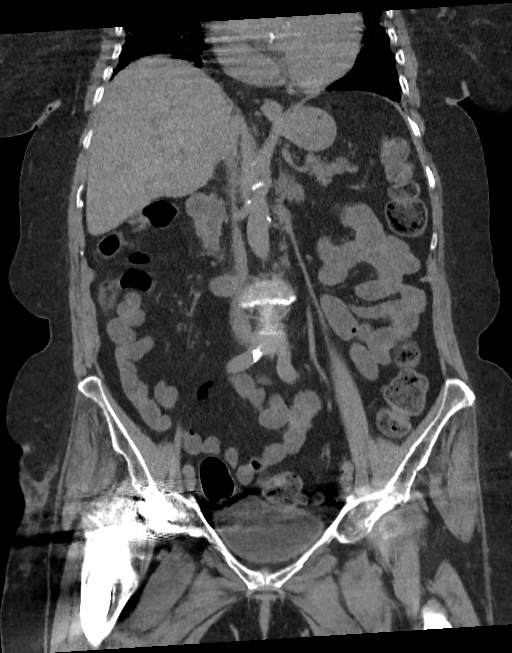
[im 67/120  soft-tissue]
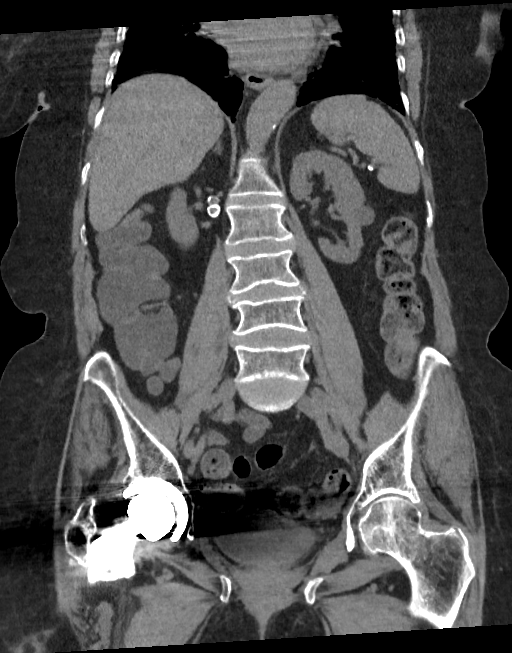

[16 of 46 positions shown; findings below may reference images not displayed]

FINDINGS: Lower chest: Heart is enlarged. Chronic fibrotic changes at the lung
bases. No acute findings.

Hepatobiliary: Normal liver. Gallbladder surgically absent. Mild
chronic dilation of common bile duct with distal tapering.

Pancreas: Unremarkable. No pancreatic ductal dilatation or
surrounding inflammatory changes.

Spleen: Spleen normal in size. Scattered calcified granuloma. No
masses.

Adrenals/Urinary Tract: No adrenal masses.

Kidneys normal in size, orientation and position. Low-attenuation,
1.4 cm, 3 exophytic low-attenuation masses from the lower pole the
left kidney, 2 largest measuring 1.4 cm, consistent with cysts and
similar to the prior CT. No other masses. No collecting system
stones. No hydronephrosis. Normal ureters. Normal bladder.

Stomach/Bowel: Stomach is unremarkable. Small bowel and colon are
normal in caliber. No wall thickening. No inflammation.

Vascular/Lymphatic: Aortic atherosclerosis. No aneurysm. No enlarged
lymph nodes.

Reproductive: Status post hysterectomy. No adnexal masses.

Other: No abdominal wall hernia or abnormality. No abdominopelvic
ascites.

Musculoskeletal: Total right hip arthroplasty, which appears well
seated and aligned. No fracture. No bone lesion.
IMPRESSION: 1. No acute findings within the abdomen or pelvis. No renal or
ureteral stones. No obstructive uropathy.
2. Chronic findings include low-density left renal masses consistent
with cysts, chronic dilation of the common bile duct, aortic
atherosclerosis, changes from a prior hysterectomy, mild
cardiomegaly and chronic fibrotic changes at the lung bases.

## 2021-03-22 IMAGING — CR DG CHEST 2V
1 series · 2 of 2 positions shown · non-contrast
Comparison: October 29, 2018.

CLINICAL DATA: Cough, right rib pain.

EXAM:
CHEST - 2 VIEW

[Series 1: dg chest 2 view · 0.14mm/px · 2 of 2 slices shown]
[im 1/2]
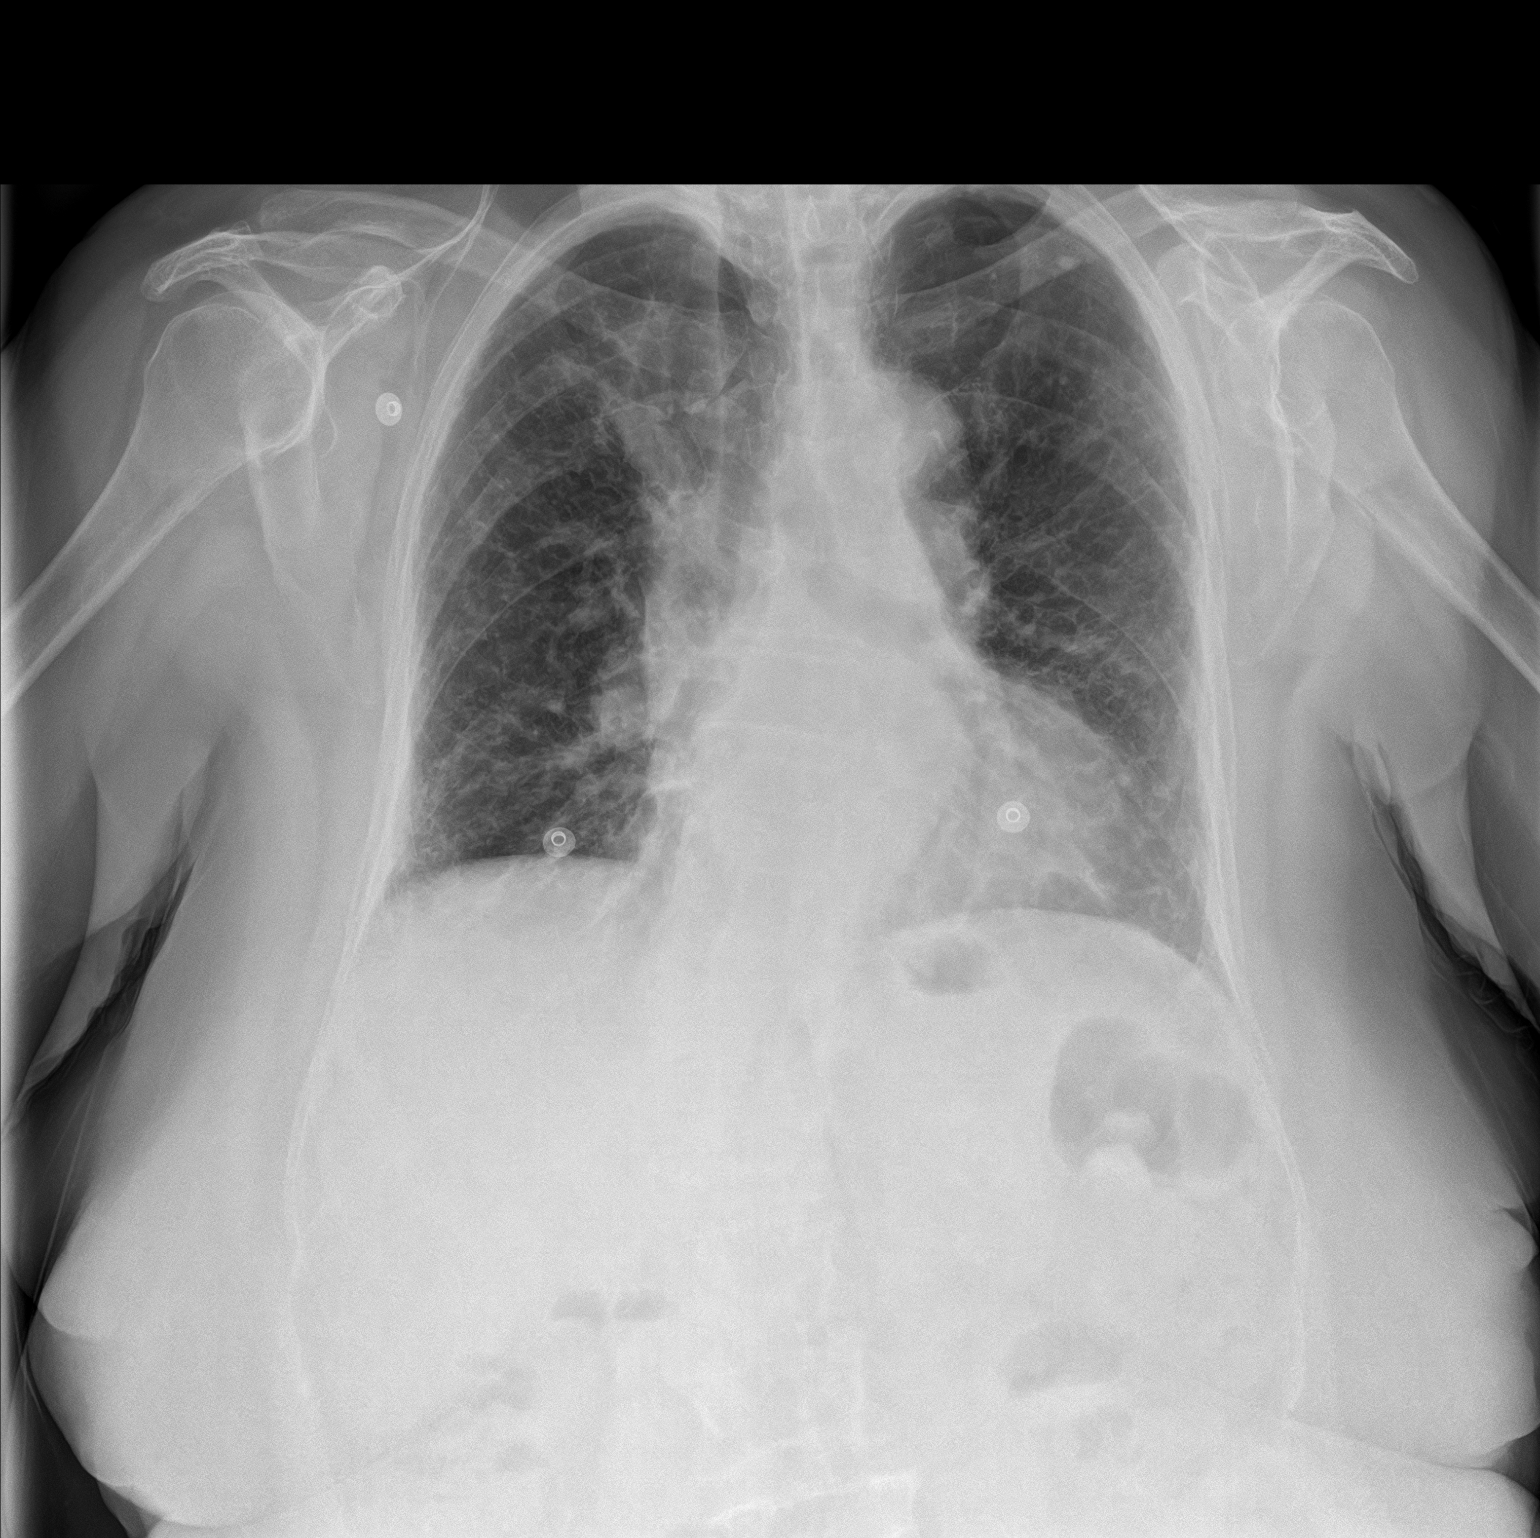
[im 2/2]
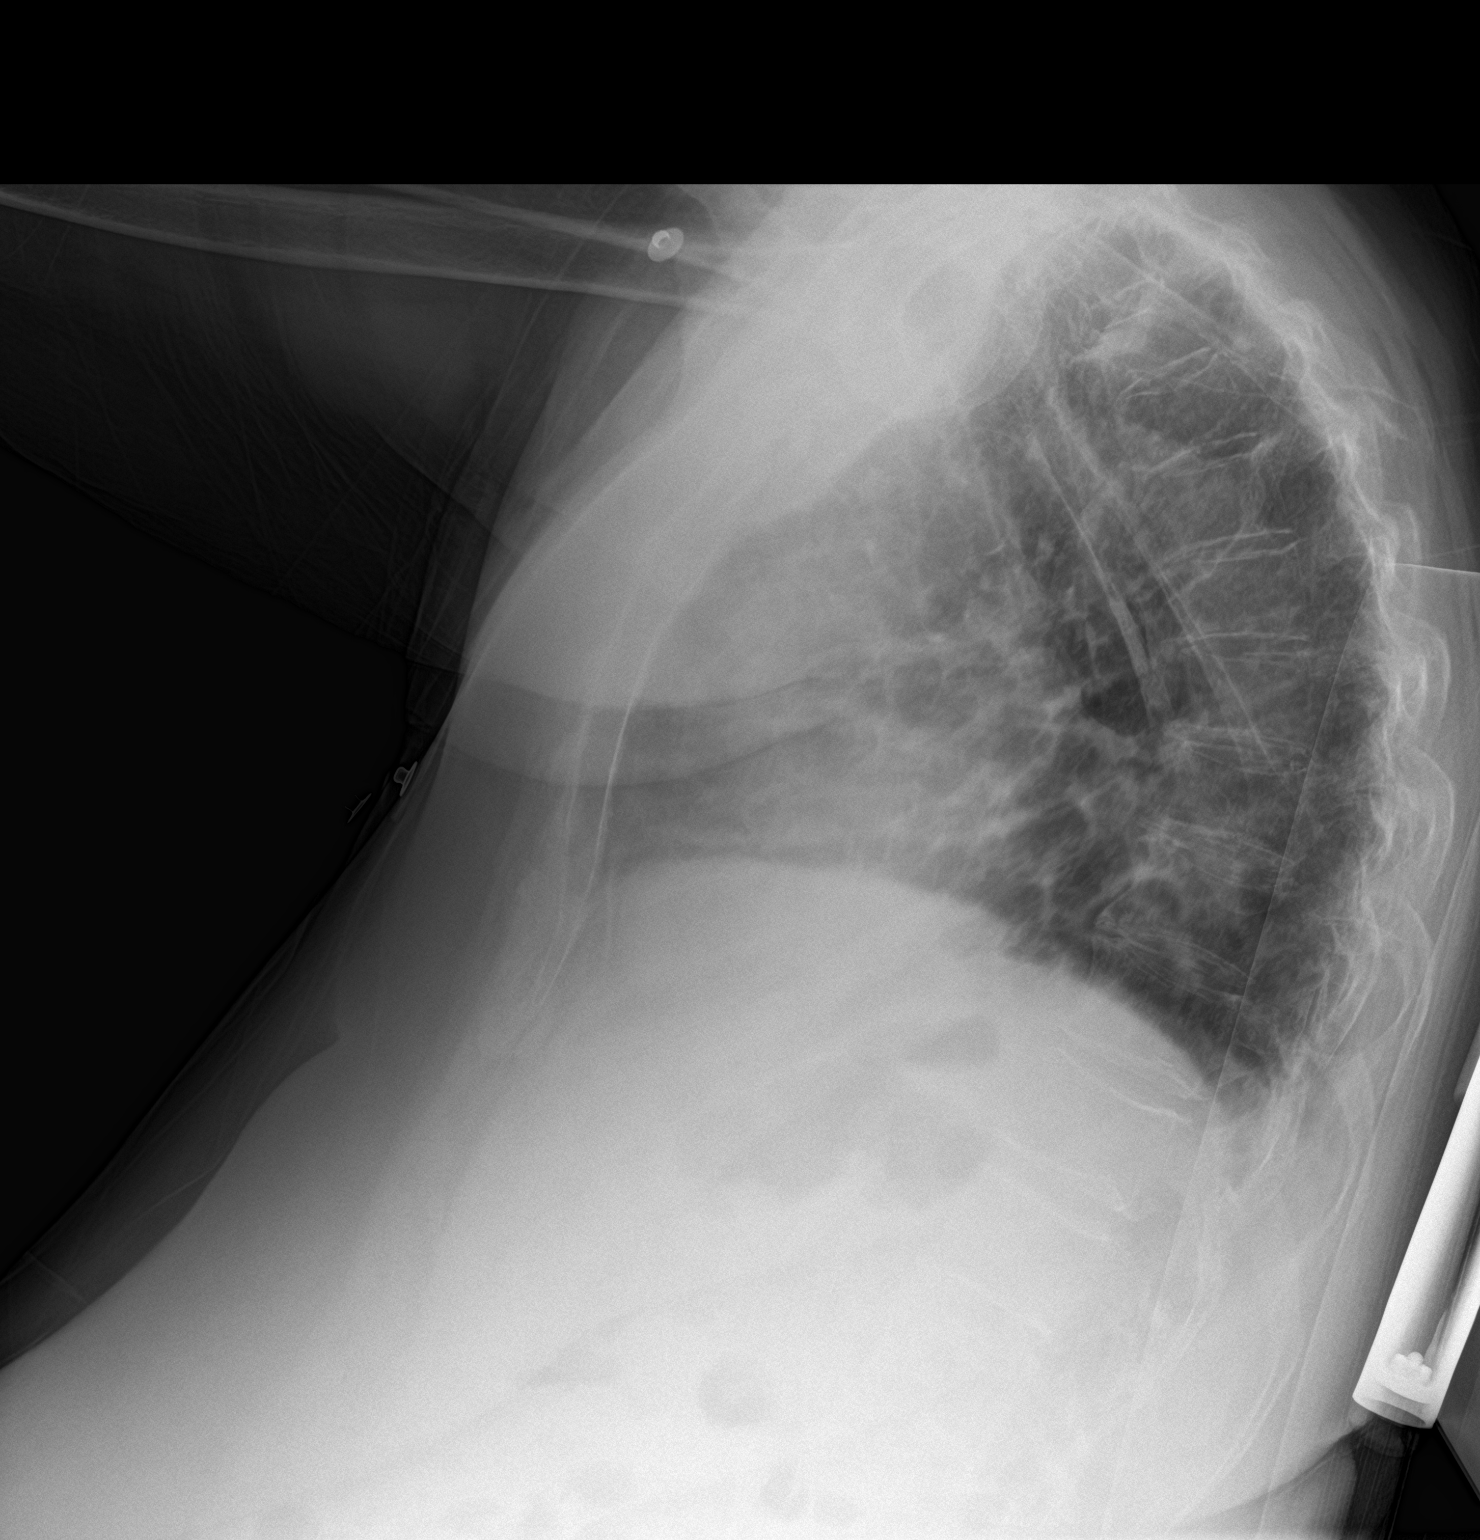

[2 of 2 positions shown; findings below may reference images not displayed]

FINDINGS: The heart size and mediastinal contours are within normal limits. No
pneumothorax or pleural effusion is noted. Stable bilateral
interstitial densities are noted concerning for fibrosis, although
superimposed inflammation cannot be excluded. The visualized
skeletal structures are unremarkable.
IMPRESSION: Stable bilateral interstitial densities are noted concerning for
fibrosis, although superimposed inflammation cannot be excluded.

## 2021-04-18 IMAGING — DX DG CHEST 1V PORT
2 series · 2 of 2 positions shown · non-contrast
Comparison: Chest radiograph dated 06/18/2019.

CLINICAL DATA: 78-year-old female with shortness of breath.

EXAM:
PORTABLE CHEST 1 VIEW

[chest ap (1 of 2)]
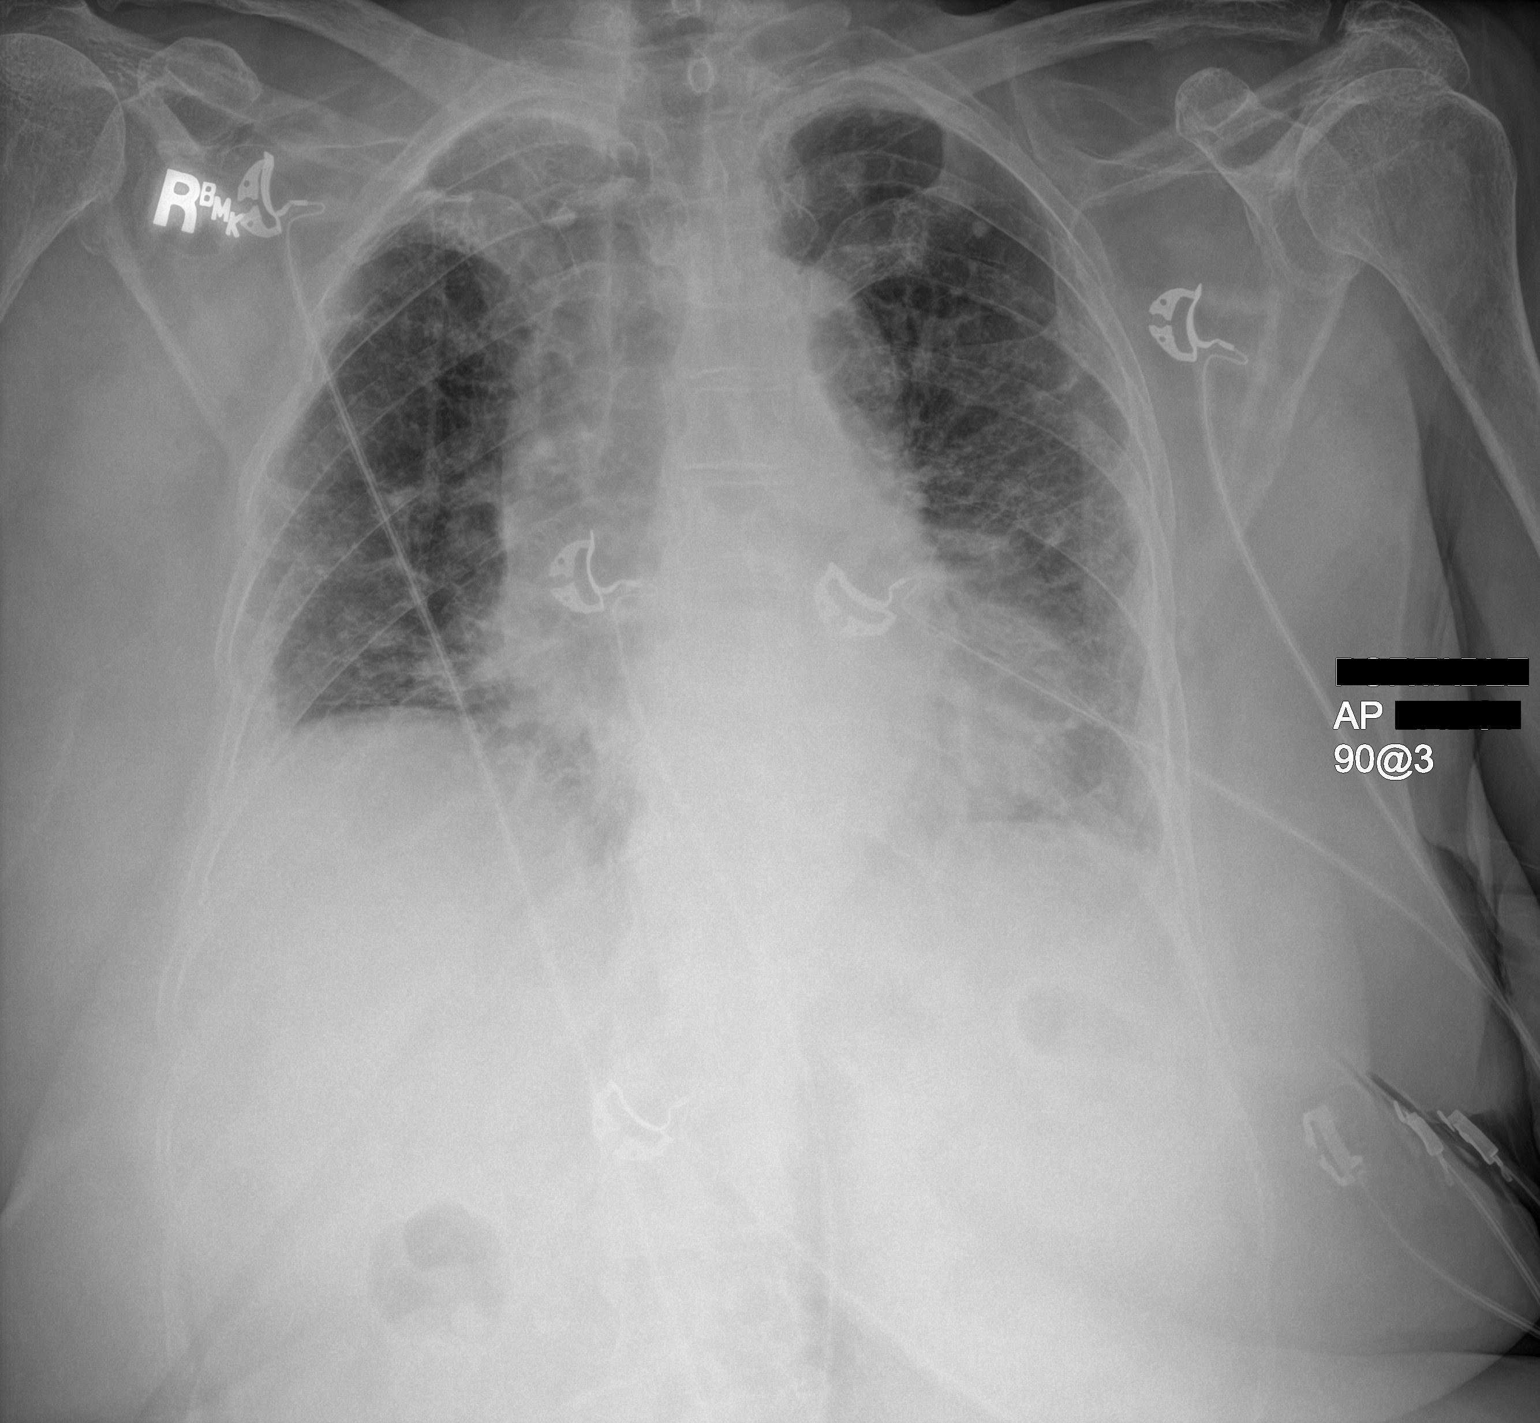

[chest ap (2 of 2)]
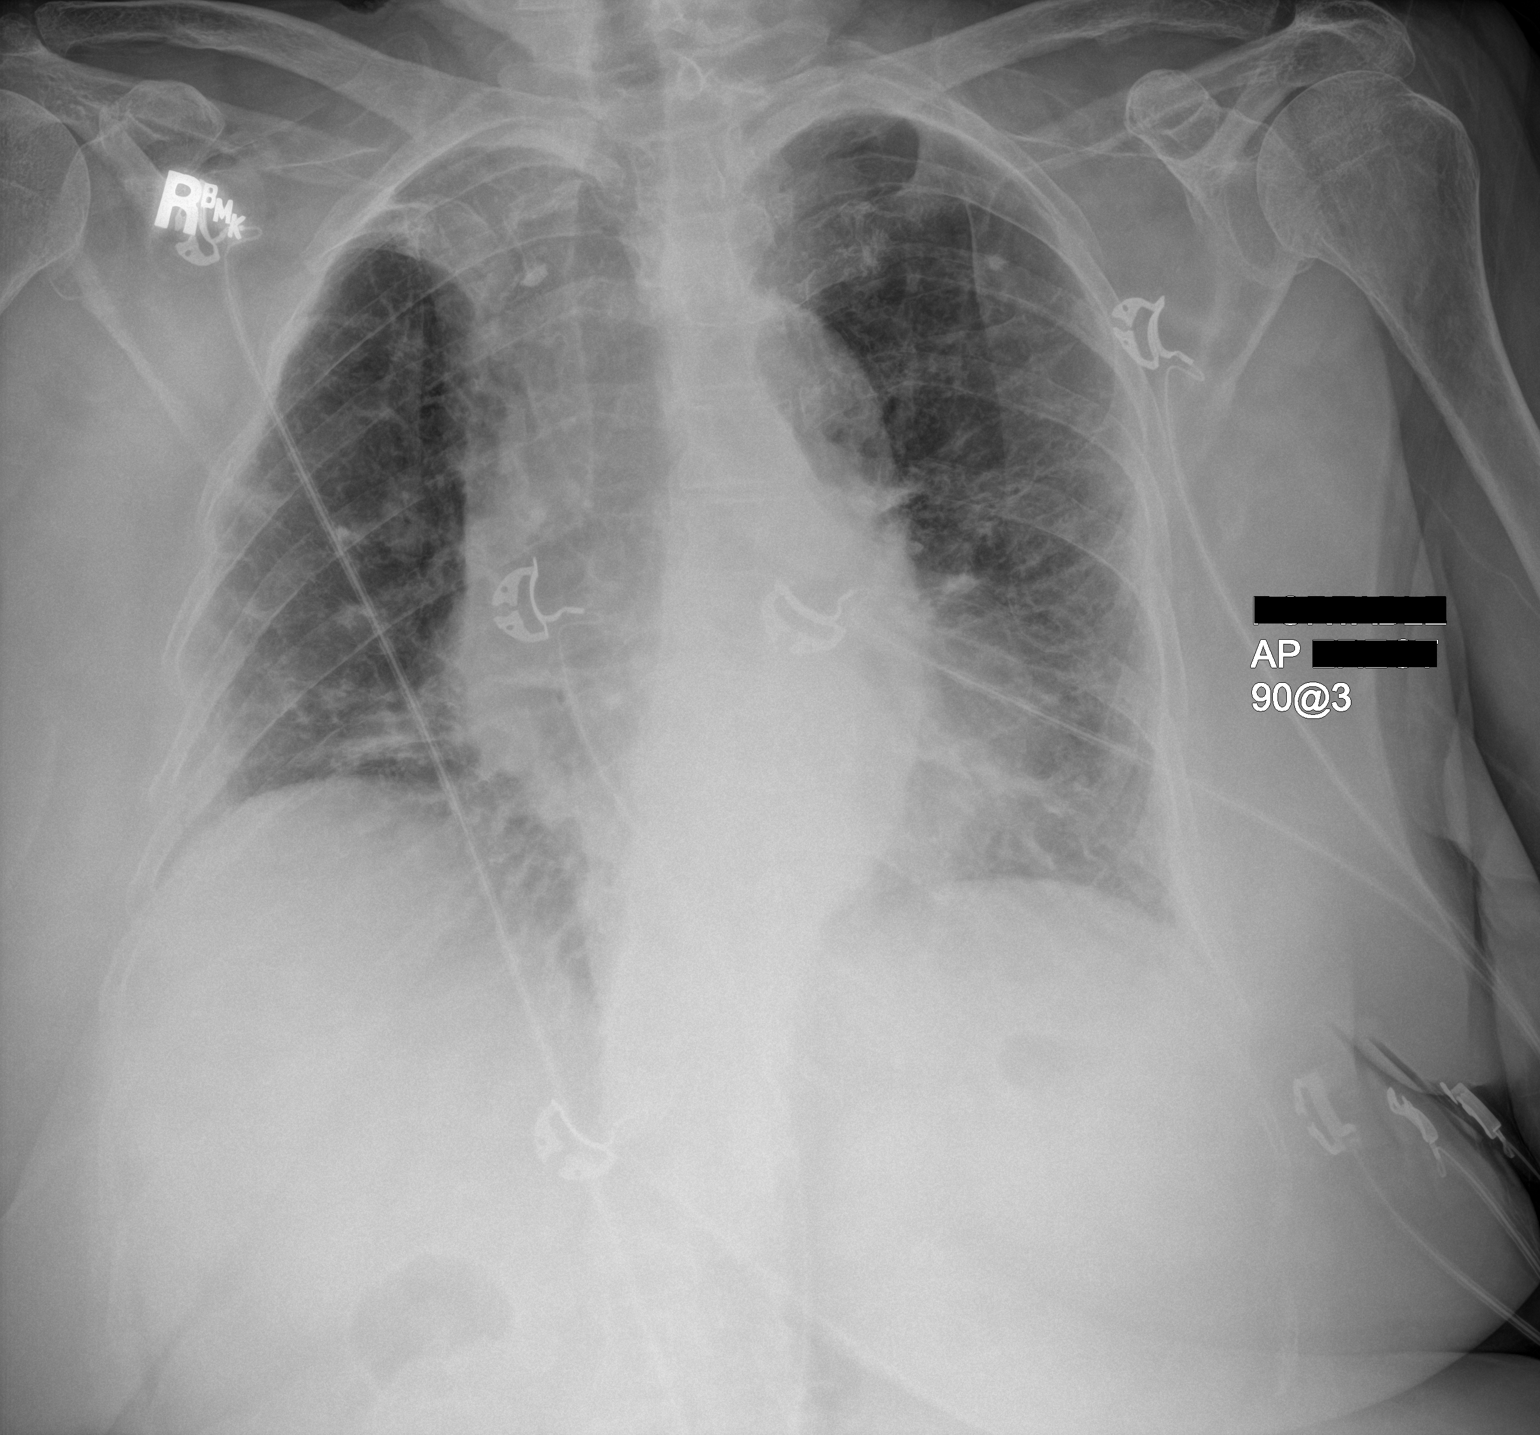

[2 of 2 positions shown; findings below may reference images not displayed]

FINDINGS: Bilateral interstitial coarsening and chronic bronchitic changes
most consistent with fibrosis similar to chest CT dating back to
05/17/2018. No new consolidative changes. There is no pleural
effusion or pneumothorax. Stable cardiac silhouette. No acute
osseous pathology.
IMPRESSION: Findings most consistent with pulmonary fibrosis. No new
consolidative changes.

## 2021-06-01 IMAGING — DX DG HIP (WITH OR WITHOUT PELVIS) 1V PORT*R*
1 series · 1 of 1 positions shown · non-contrast
Comparison: Plain film from earlier same day.

CLINICAL DATA: Post reduction.

EXAM:
DG HIP (WITH OR WITHOUT PELVIS) 1V PORT RIGHT

[hip ap]
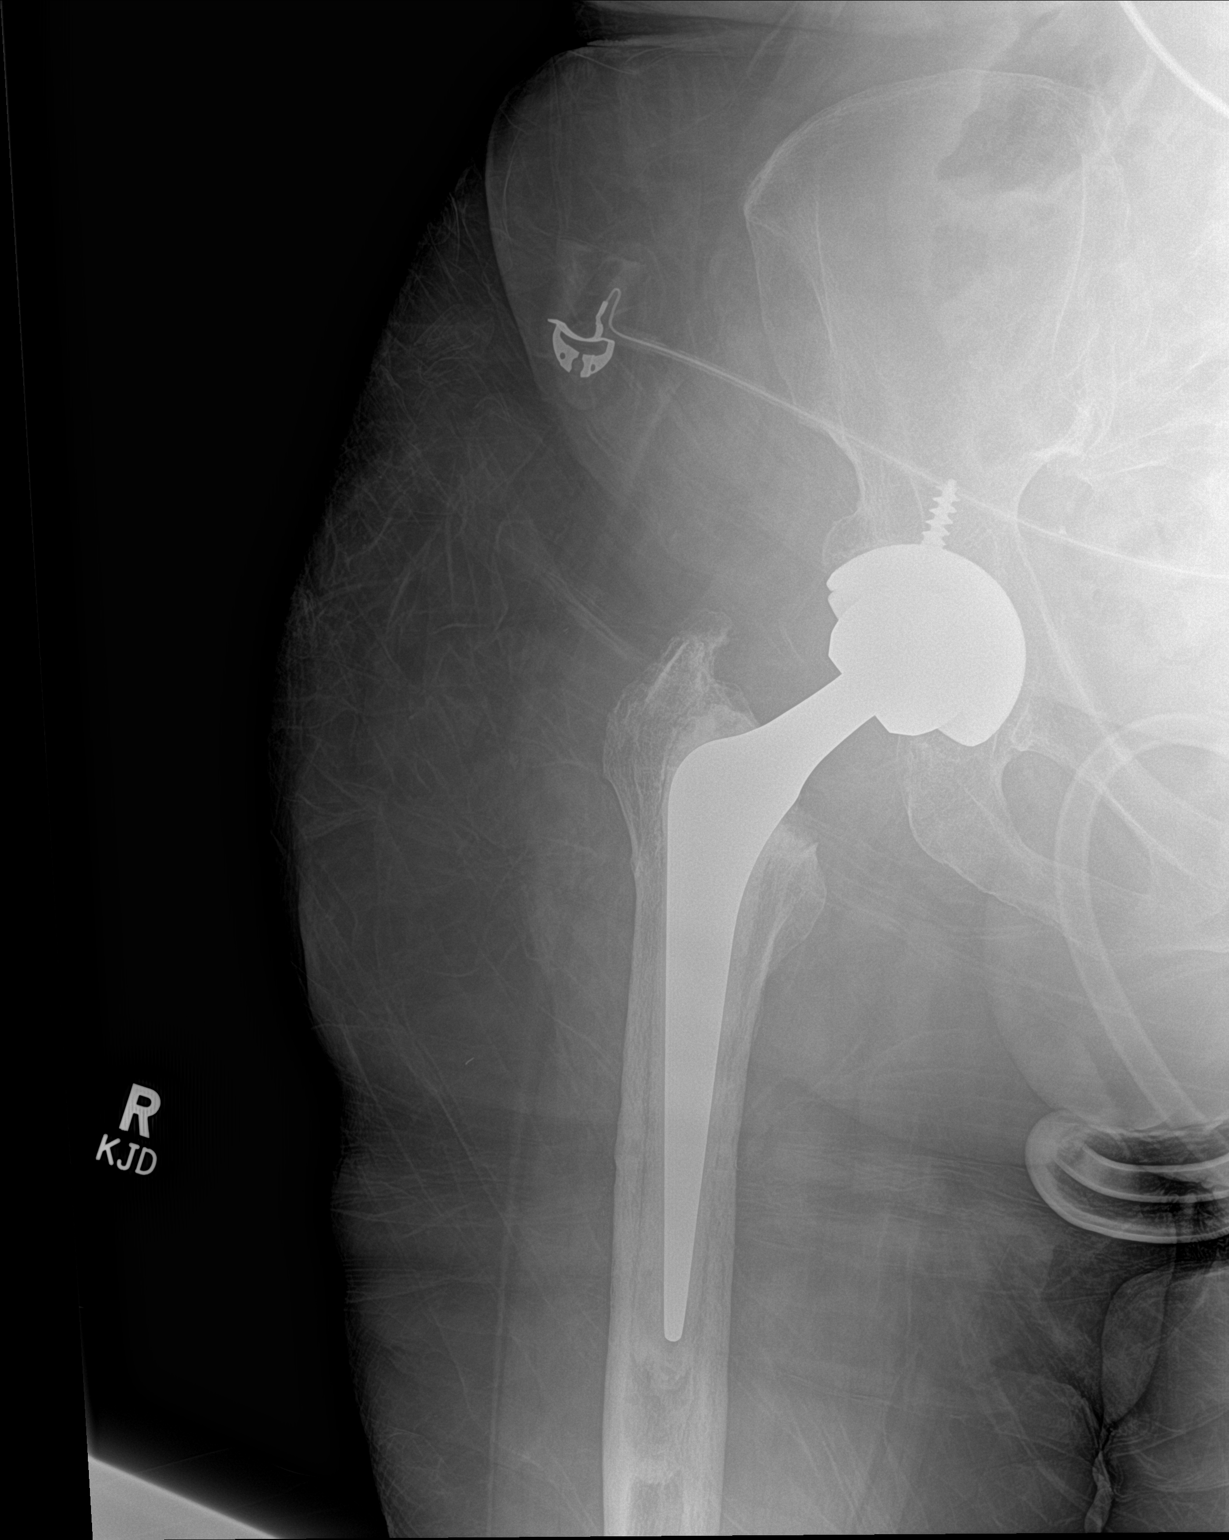

[1 of 1 positions shown; findings below may reference images not displayed]

FINDINGS: Femoral head component of the prosthesis is now well positioned
relative to the acetabular cup. Osseous alignment appears anatomic.
No osseous fracture line or displaced fracture fragment seen.
IMPRESSION: Normal alignment of the RIGHT hip prosthesis status post reduction.

## 2021-06-01 IMAGING — DX DG FEMUR 1V PORT*R*
2 series · 2 of 2 positions shown · non-contrast
Comparison: None.

CLINICAL DATA: Fall

EXAM:
RIGHT FEMUR PORTABLE 1 VIEW

[femur ap (1 of 2)]
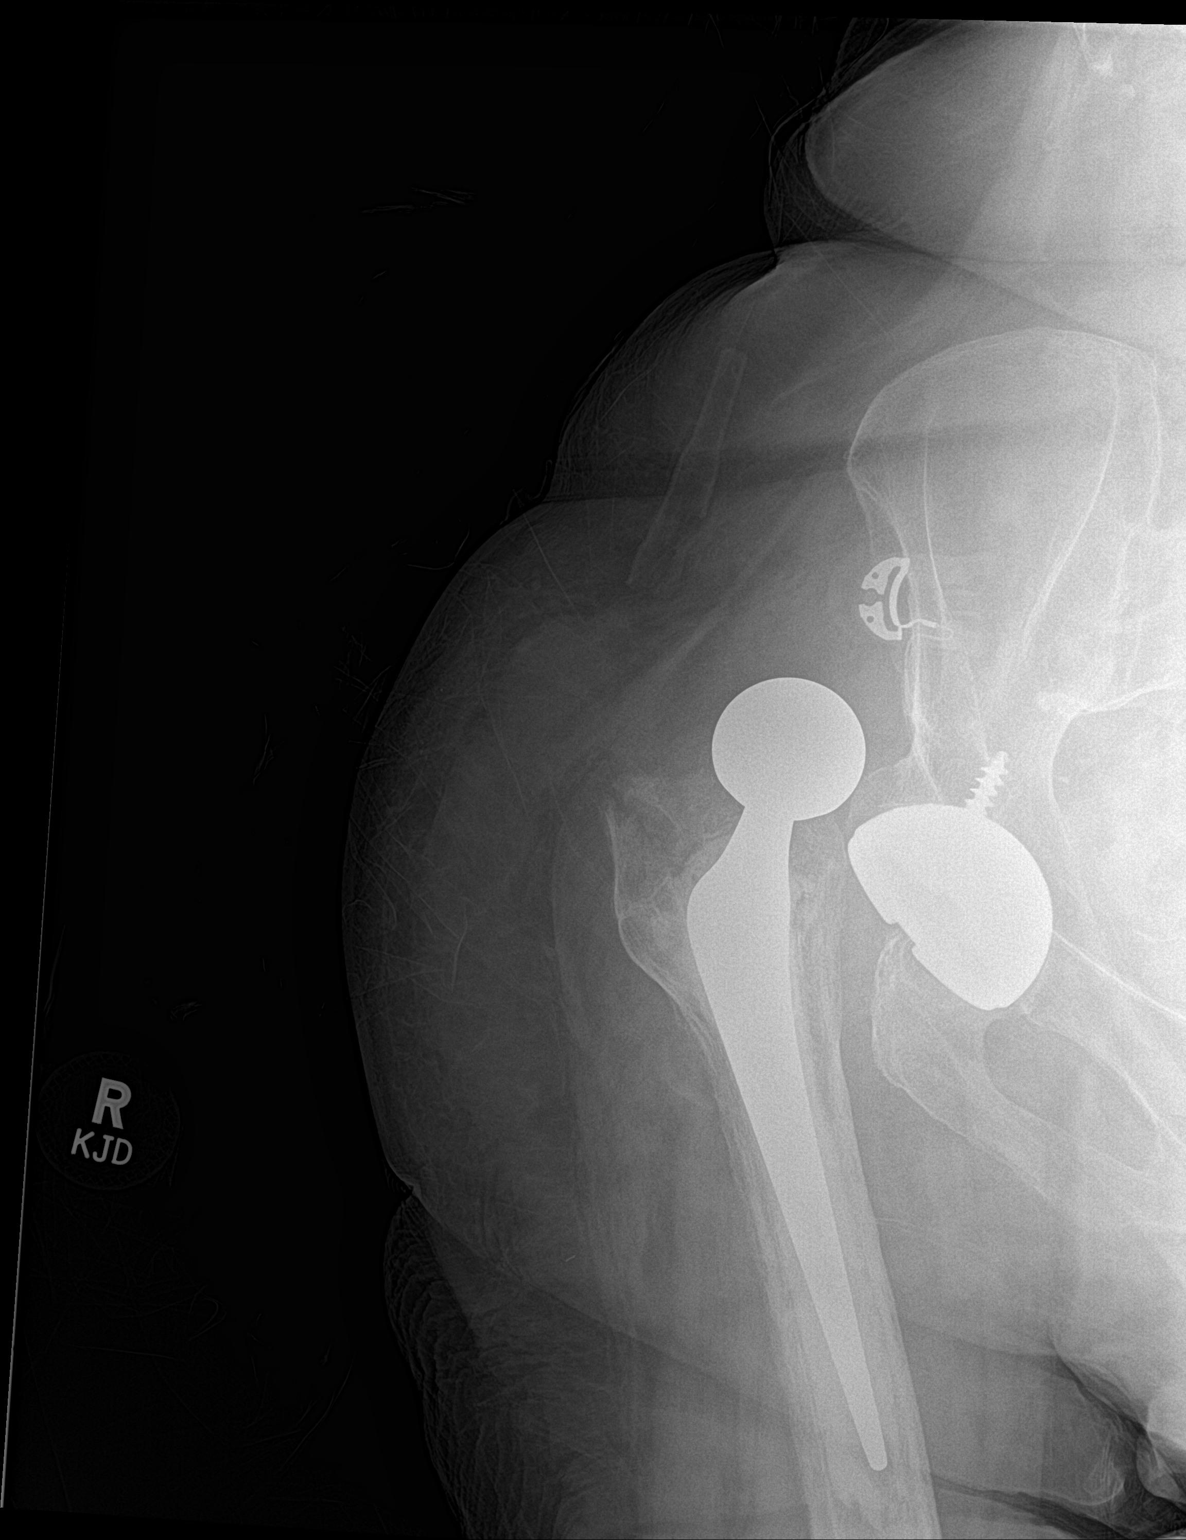

[femur ap (2 of 2)]
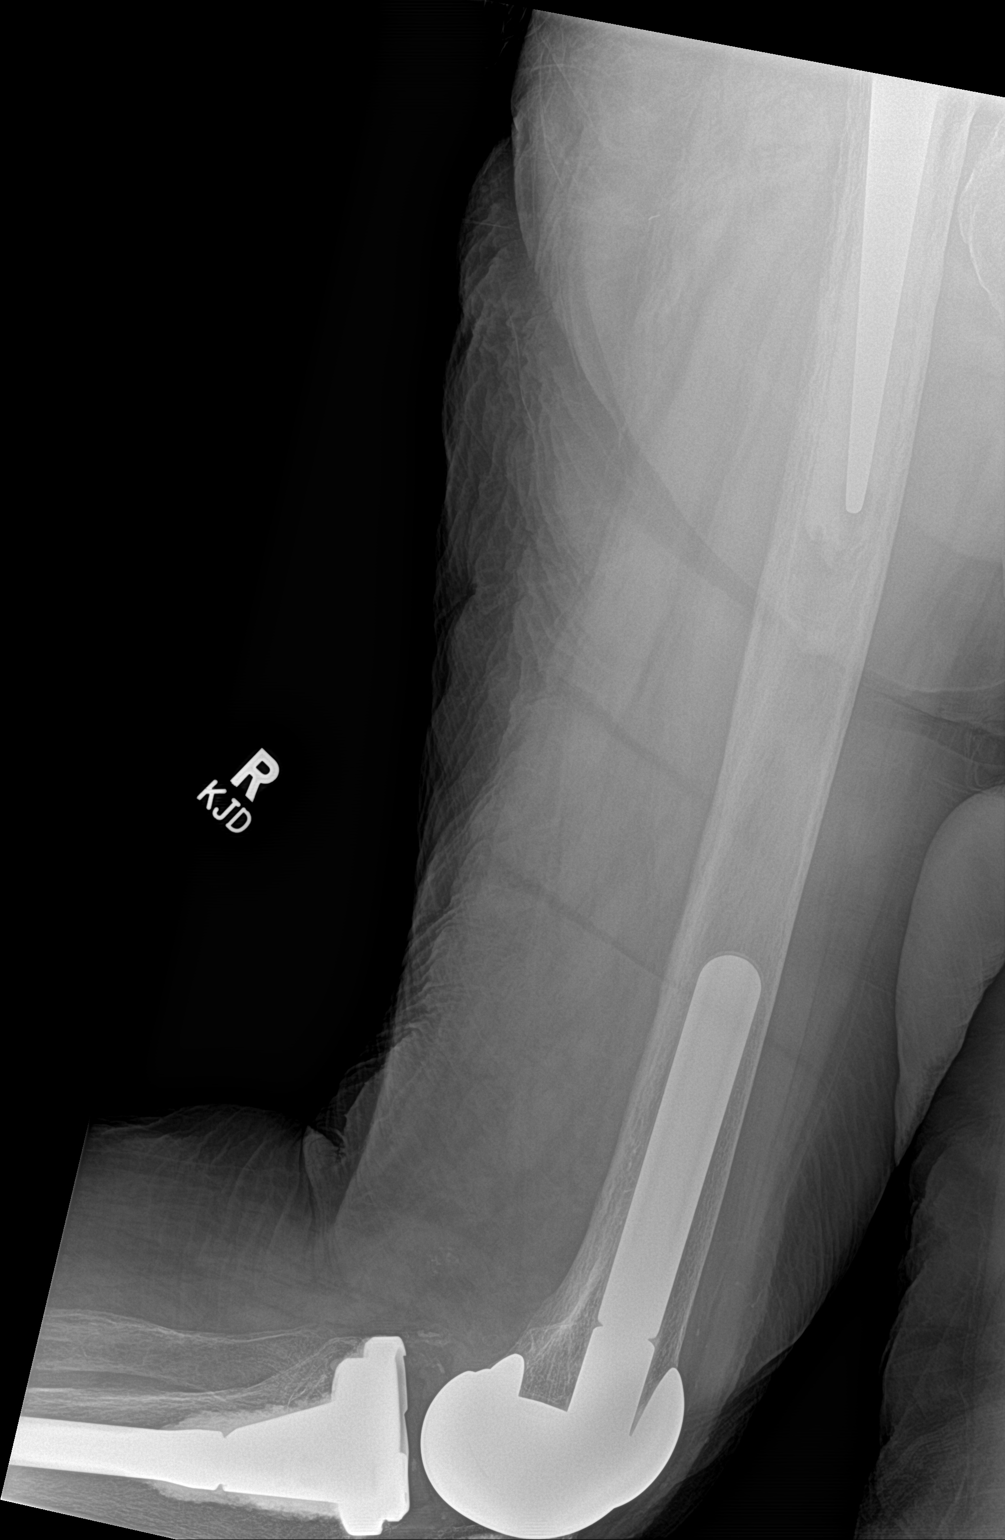

[2 of 2 positions shown; findings below may reference images not displayed]

FINDINGS: Superior dislocation of right total hip arthroplasty. No femoral
fracture. Unremarkable appearance of right total knee arthroplasty.
IMPRESSION: Superior dislocation of right total hip arthroplasty. No femoral
fracture.

## 2021-06-01 IMAGING — CT CT HEAD W/O CM
4 of 5 series · 14 of 47 positions shown, 16 images · non-contrast
Comparison: None.

CLINICAL DATA: Fall

EXAM:
CT HEAD WITHOUT CONTRAST
CT CERVICAL SPINE WITHOUT CONTRAST
TECHNIQUE: Multidetector CT imaging of the head and cervical spine was
performed following the standard protocol without intravenous
contrast. Multiplanar CT image reconstructions of the cervical spine
were also generated.

[Series 505: head without · axial · non-contrast · 0.45mm/px · z∈[-262,-192]mm · 3 of 36 slices shown]
[im 8/36  brain]
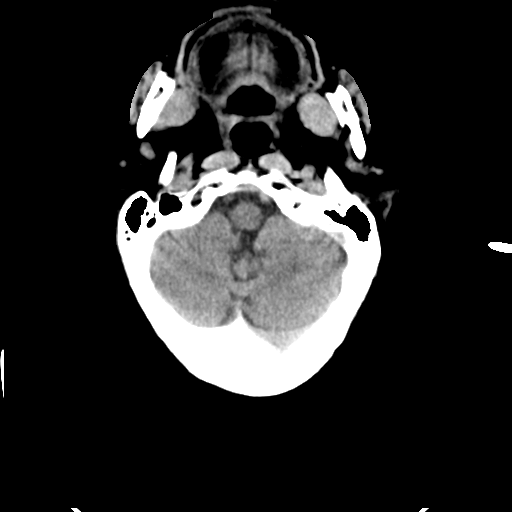
[im 15/36  brain]
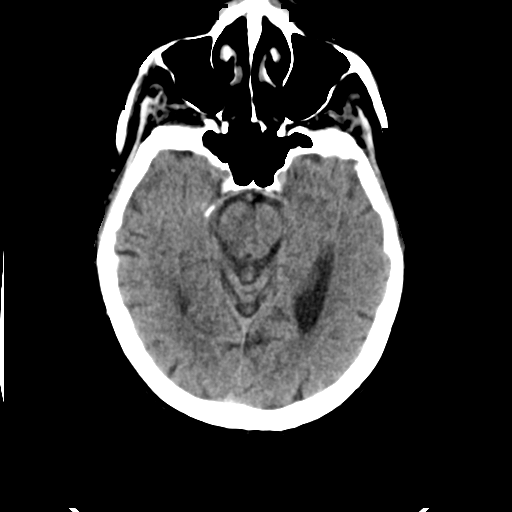
[im 22/36  brain]
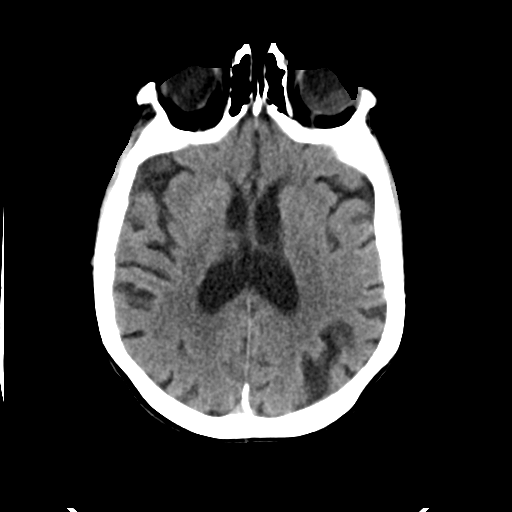

[Series 507: head without cor · coronal · non-contrast · 0.38mm/px · 3 of 87 slices shown]
[im 29/87  brain]
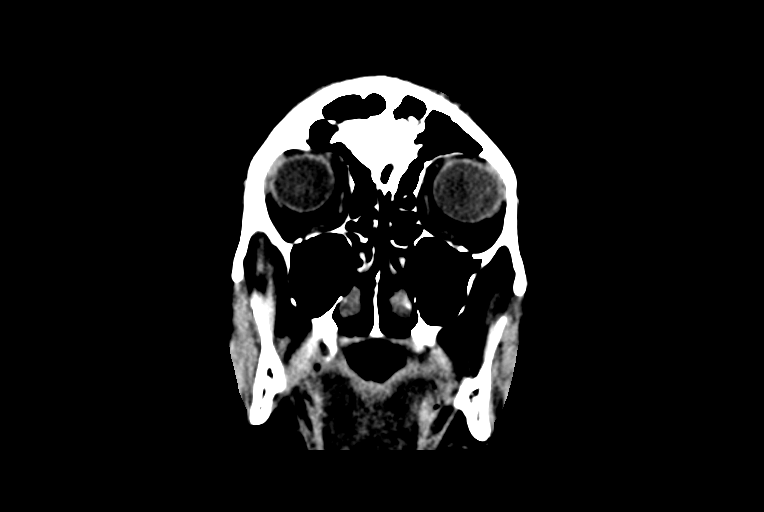
[im 39/87  brain]
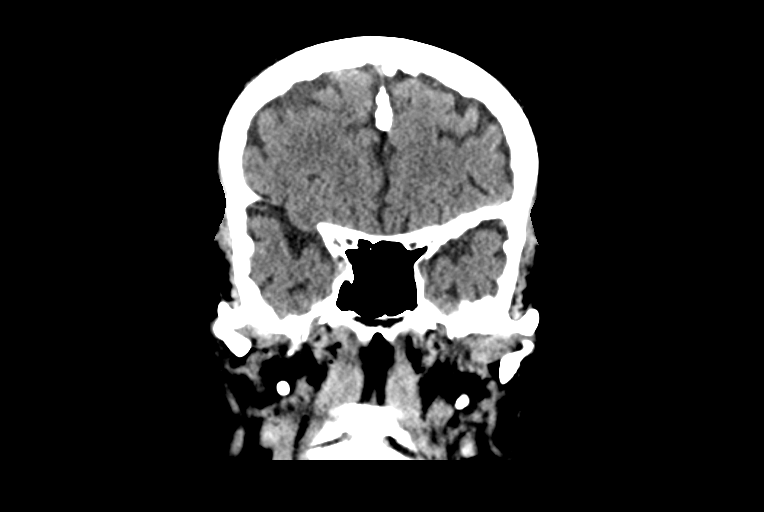
[im 48/87  brain]
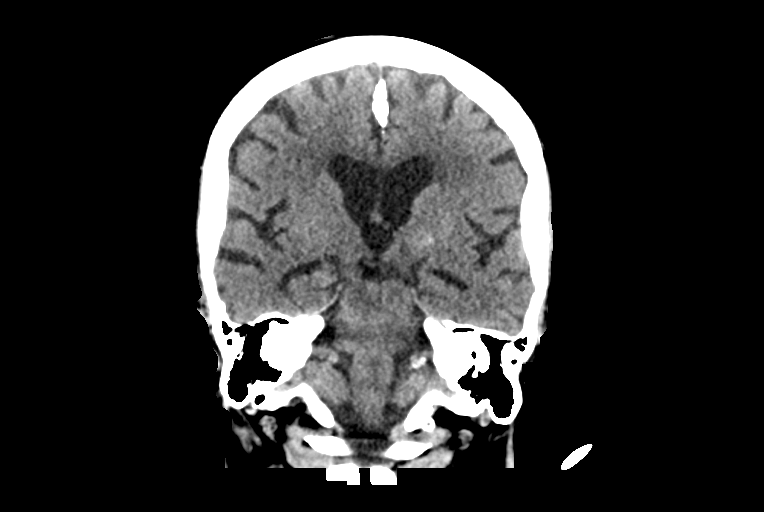

[Series 508: head without sag · sagittal · non-contrast · 0.42mm/px · 3 of 67 slices shown]
[im 23/67  brain]
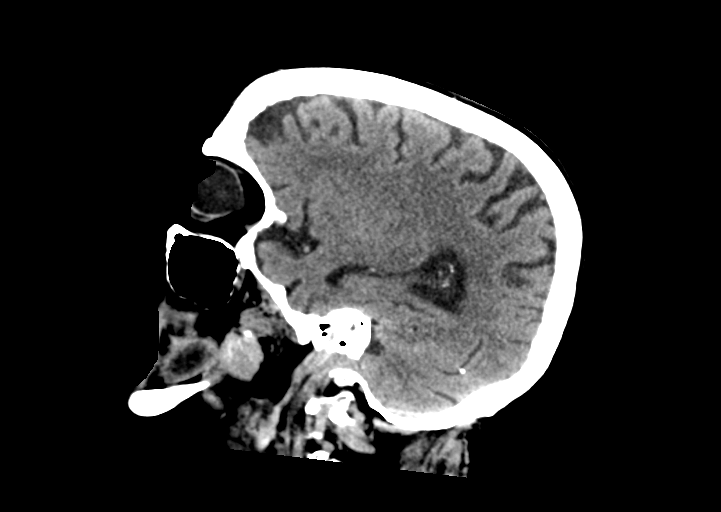
[im 34/67  brain]
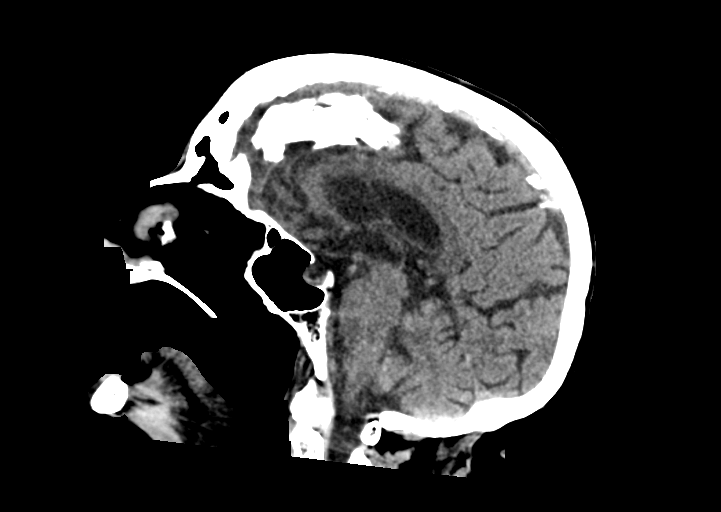
[im 45/67  brain]
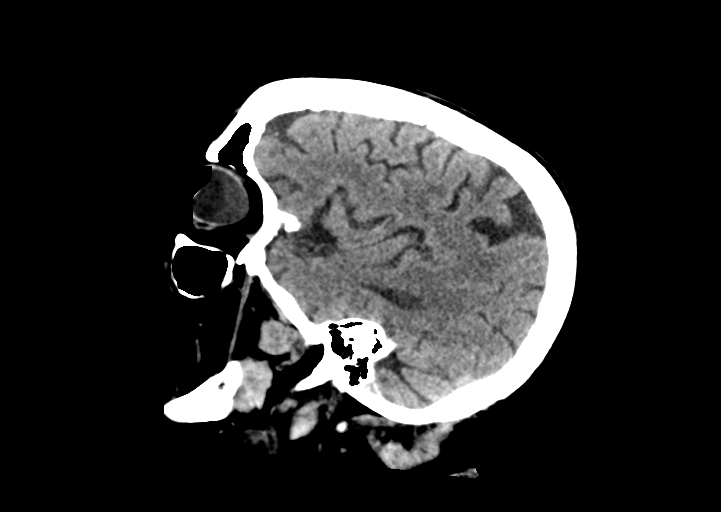

[Series 509: head without ax · axial · non-contrast · 0.45mm/px · z∈[-206,-108]mm · 5 of 35 slices shown, 7 images]
[im 6/35  brain]
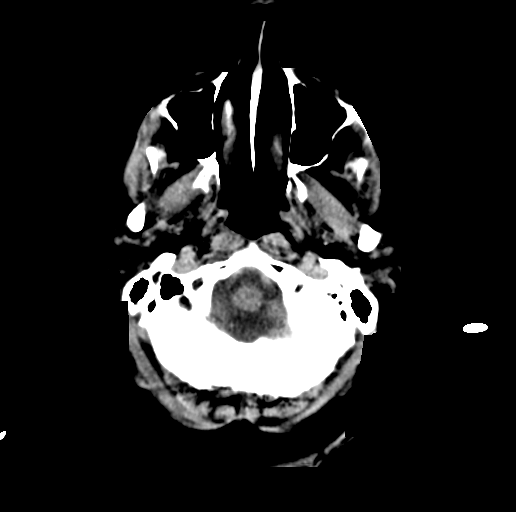
[im 6/35  bone]
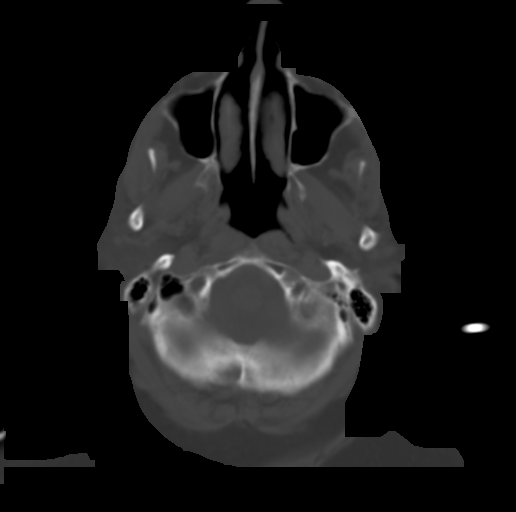
[im 12/35  brain]
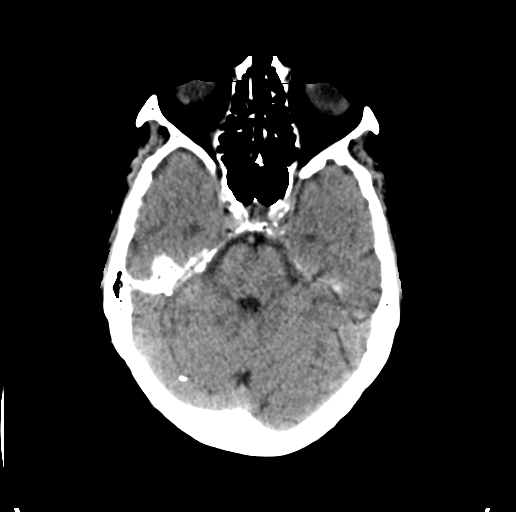
[im 18/35  brain]
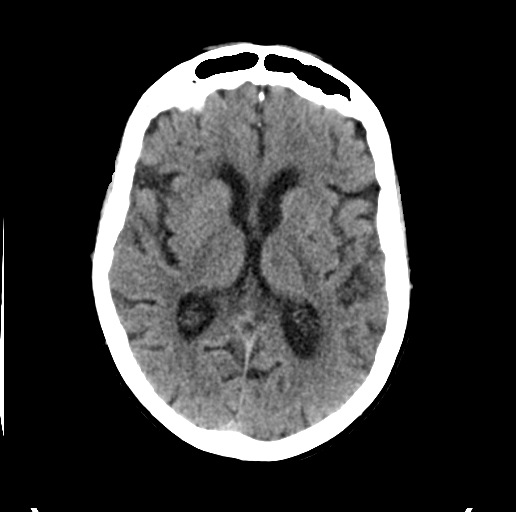
[im 23/35  brain]
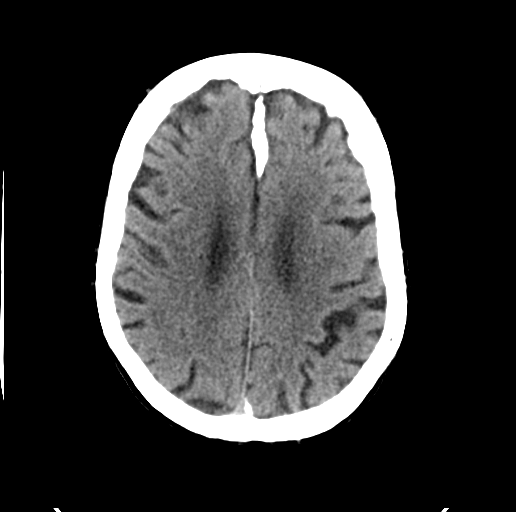
[im 29/35  brain]
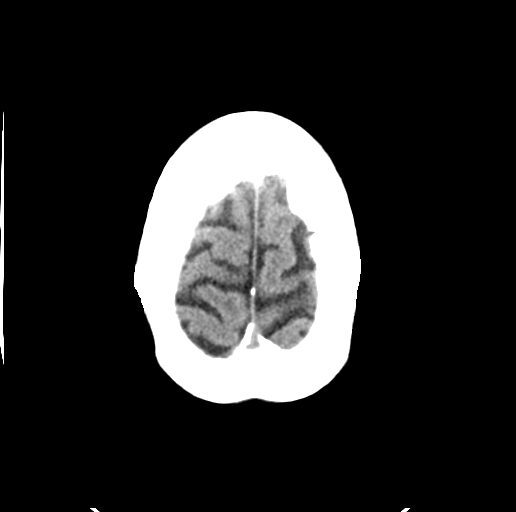
[im 29/35  bone]
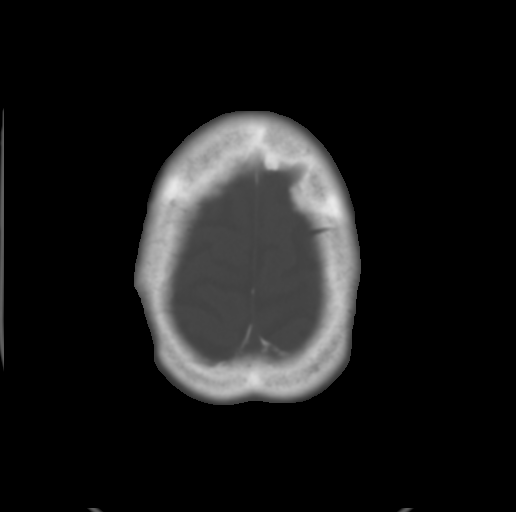

[14 of 47 positions shown; findings below may reference images not displayed]

FINDINGS: CT HEAD FINDINGS

Brain: There is no mass, hemorrhage or extra-axial collection. There
is generalized atrophy without lobar predilection. There is
hypoattenuation of the periventricular white matter, most commonly
indicating chronic ischemic microangiopathy.

Vascular: No abnormal hyperdensity of the major intracranial
arteries or dural venous sinuses. No intracranial atherosclerosis.

Skull: Right frontal scalp hematoma.  No skull fracture.

Sinuses/Orbits: No fluid levels or advanced mucosal thickening of
the visualized paranasal sinuses. No mastoid or middle ear effusion.
The orbits are normal.

CT CERVICAL SPINE FINDINGS

Alignment: No static subluxation. Facets are aligned. Occipital
condyles are normally positioned.

Skull base and vertebrae: No acute fracture.

Soft tissues and spinal canal: No prevertebral fluid or swelling. No
visible canal hematoma.

Disc levels: No advanced spinal canal or neural foraminal stenosis.

Upper chest: No pneumothorax, pulmonary nodule or pleural effusion.

Other: Normal visualized paraspinal cervical soft tissues.
IMPRESSION: 1. No acute intracranial abnormality.
2. Right frontal scalp hematoma without skull fracture.
3. No acute fracture or static subluxation of the cervical spine.

## 2021-06-01 IMAGING — DX DG PORTABLE PELVIS
1 series · 1 of 1 positions shown · non-contrast
Comparison: None.

CLINICAL DATA: Fall

EXAM:
PORTABLE PELVIS 1-2 VIEWS

[pelvis ap]
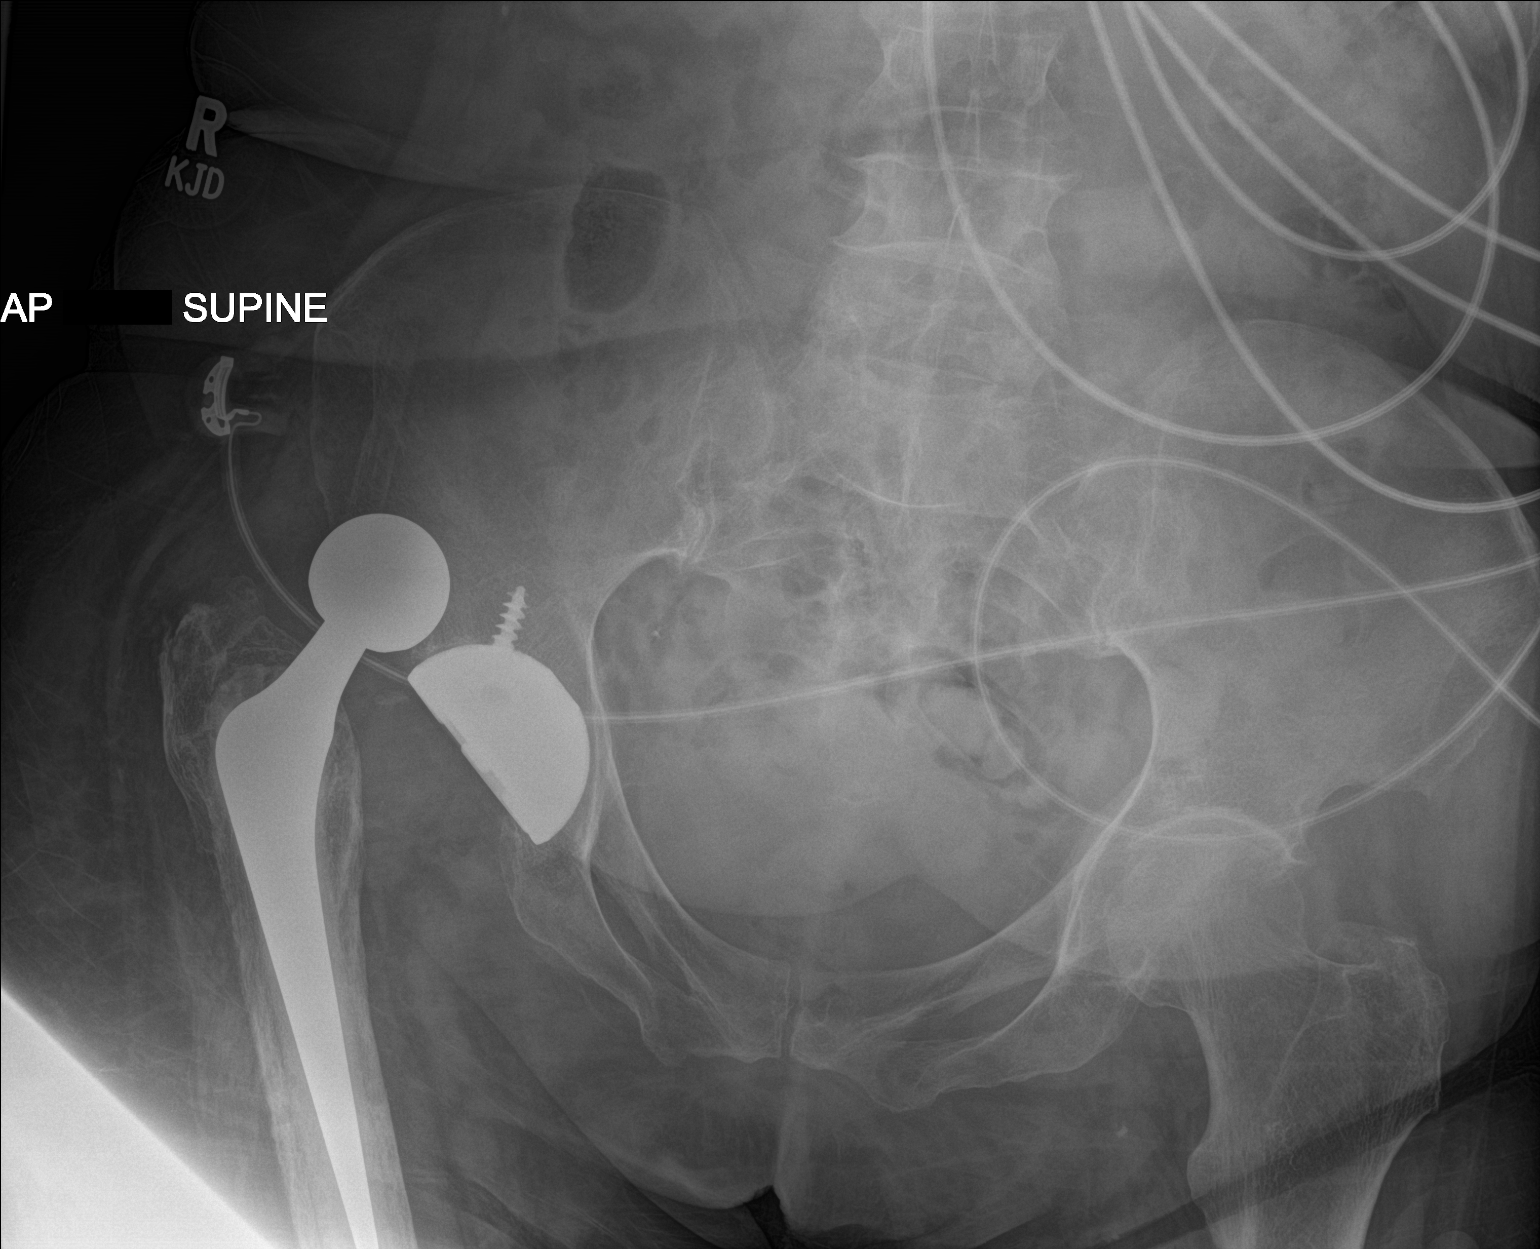

[1 of 1 positions shown; findings below may reference images not displayed]

FINDINGS: Superior dislocation of the right total hip arthroplasty. No
fracture.
IMPRESSION: Superior dislocation of right total hip arthroplasty.

## 2021-06-07 IMAGING — CT CT HEAD W/O CM
4 series · 17 of 47 positions shown, 19 images · non-contrast
Comparison: 08/28/2019

CLINICAL DATA: Head trauma, headache, fall

EXAM:
CT HEAD WITHOUT CONTRAST
TECHNIQUE: Contiguous axial images were obtained from the base of the skull
through the vertex without intravenous contrast.

[Series 3: head without · axial · non-contrast · 0.44mm/px · z∈[+1265,+1390]mm · 6 of 36 slices shown, 8 images]
[im 6/36  brain]
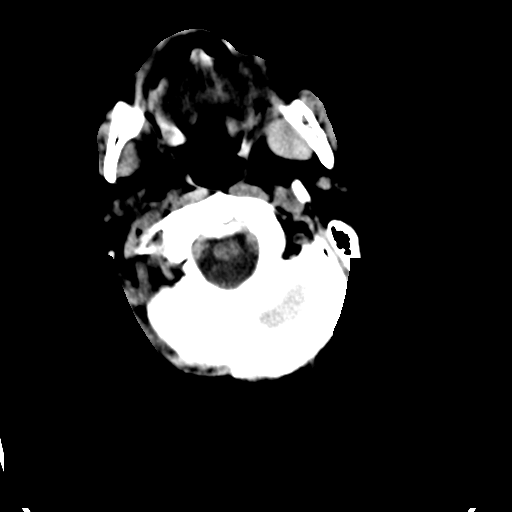
[im 6/36  bone]
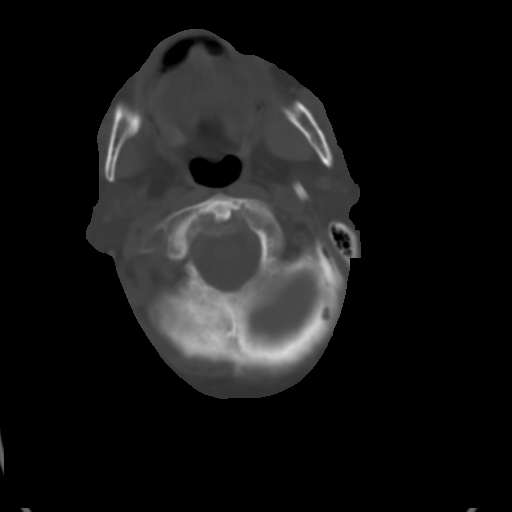
[im 11/36  brain]
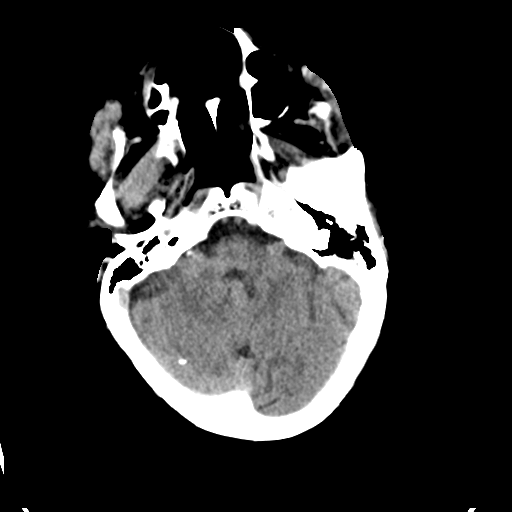
[im 16/36  brain]
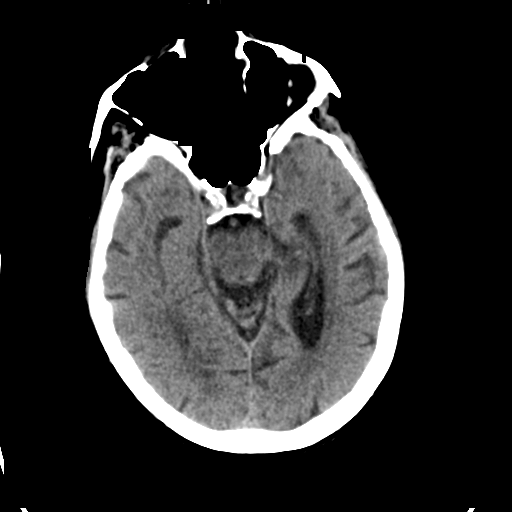
[im 21/36  brain]
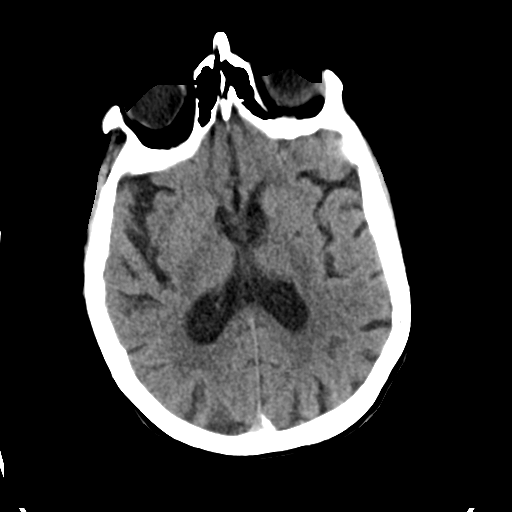
[im 26/36  brain]
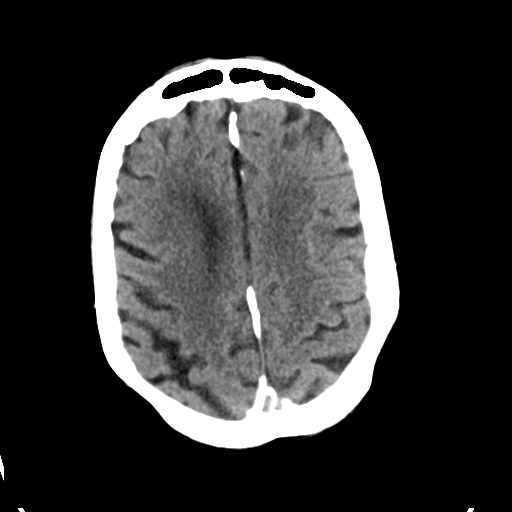
[im 26/36  bone]
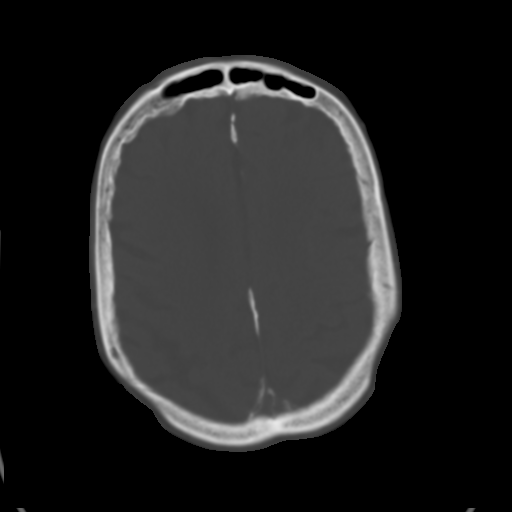
[im 31/36  brain]
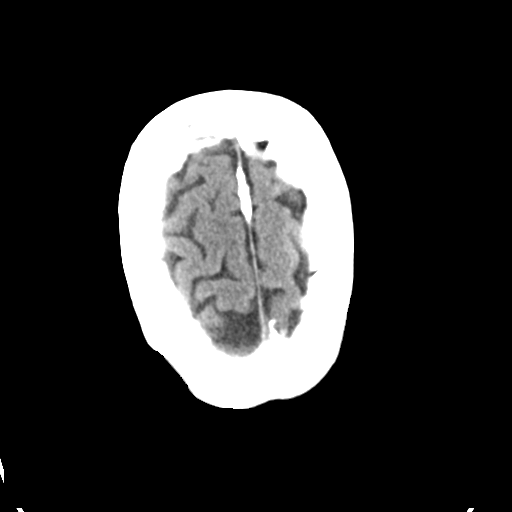

[Series 4: head bone · axial · 0.44mm/px · z∈[+1256,+1344]mm · 5 of 92 slices shown]
[im 9/92  bone]
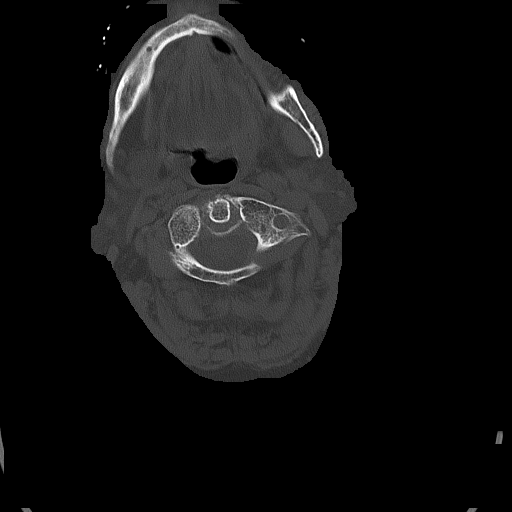
[im 18/92  bone]
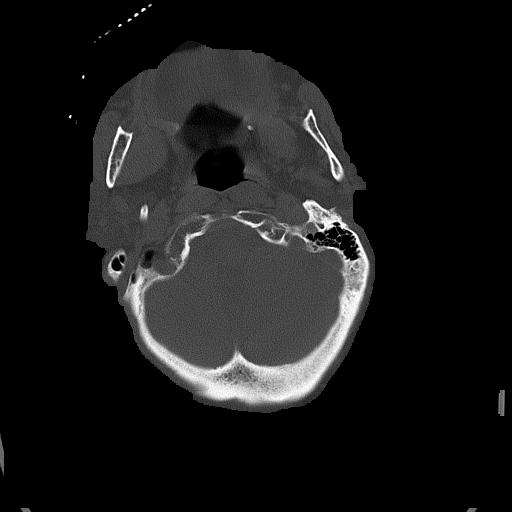
[im 31/92  bone]
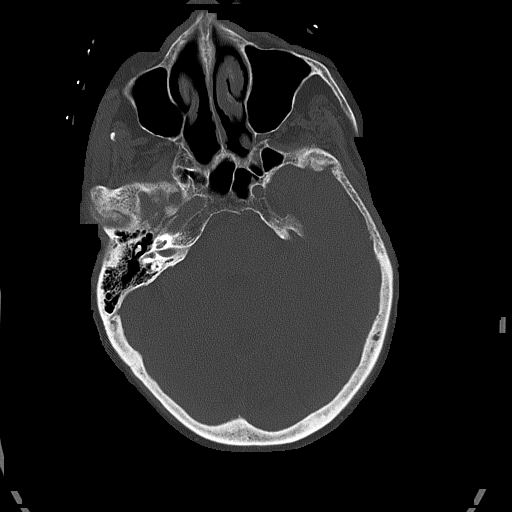
[im 40/92  bone]
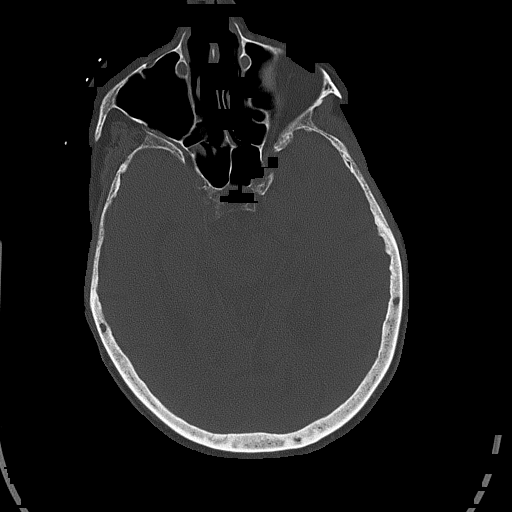
[im 53/92  bone]
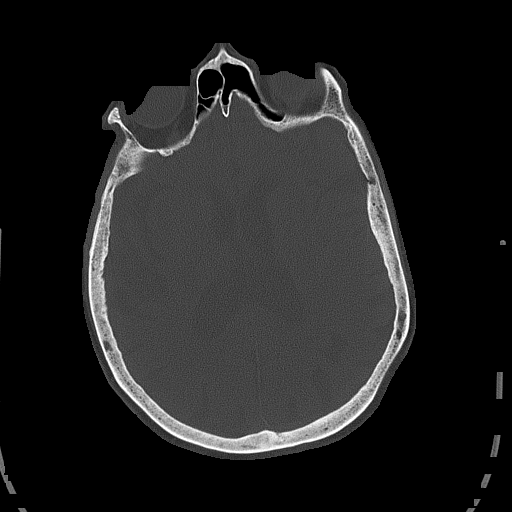

[Series 5: head without cor · coronal · non-contrast · 0.40mm/px · 3 of 79 slices shown]
[im 27/79  brain]
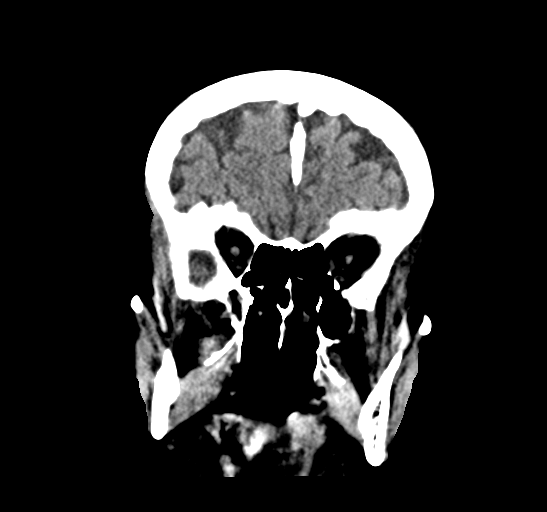
[im 35/79  brain]
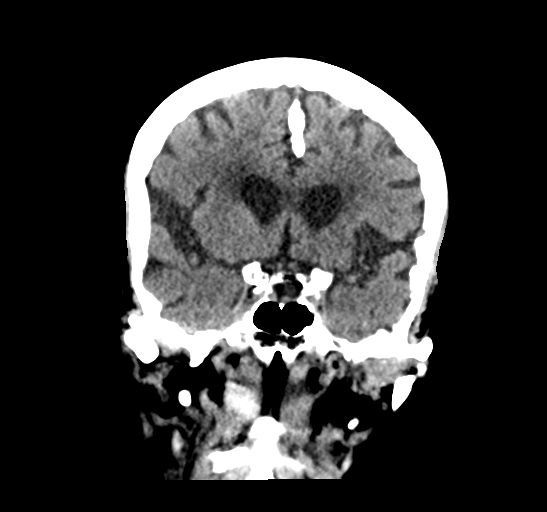
[im 44/79  brain]
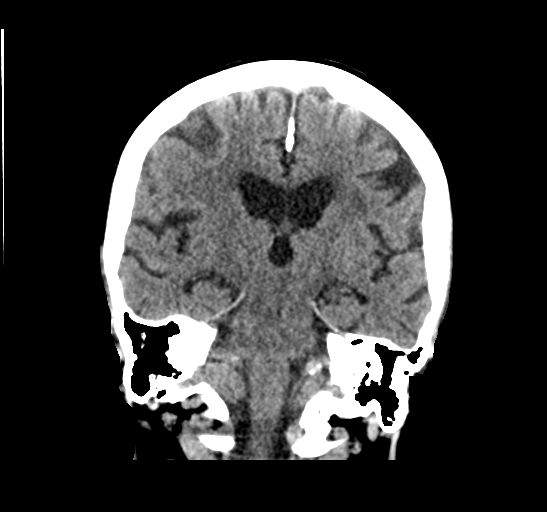

[Series 6: head without sag · sagittal · non-contrast · 0.35mm/px · 3 of 66 slices shown]
[im 22/66  brain]
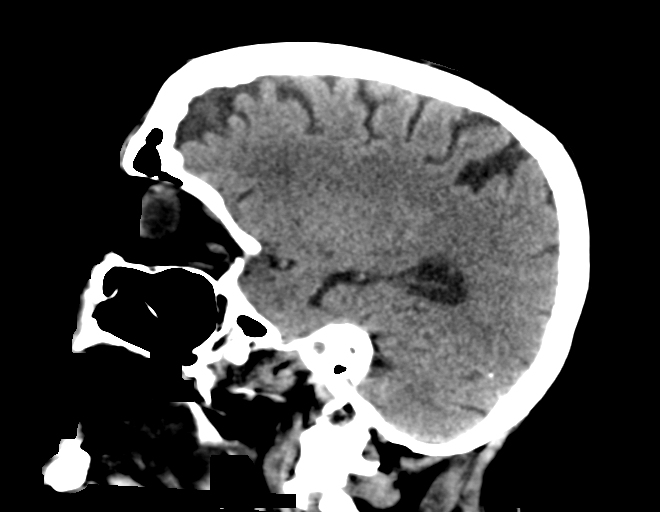
[im 33/66  brain]
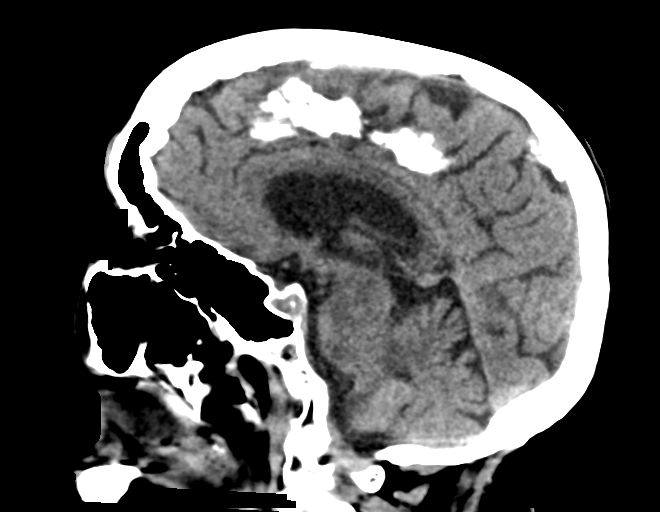
[im 44/66  brain]
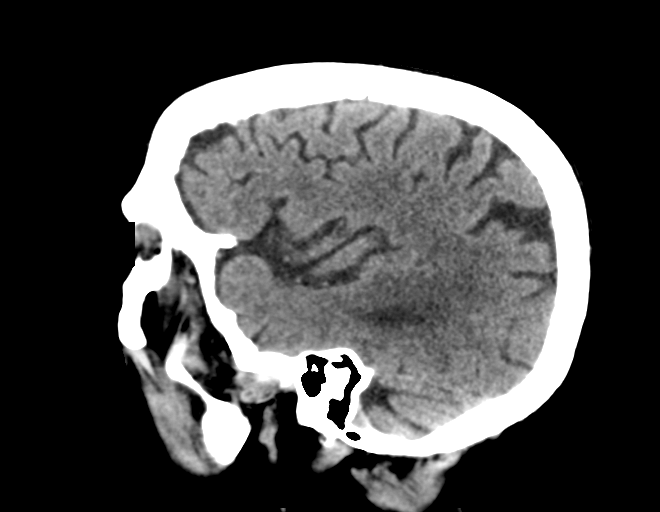

[17 of 47 positions shown; findings below may reference images not displayed]

FINDINGS: Brain: There is atrophy and chronic small vessel disease changes. No
acute intracranial abnormality. Specifically, no hemorrhage,
hydrocephalus, mass lesion, acute infarction, or significant
intracranial injury.

Vascular: No hyperdense vessel or unexpected calcification.

Skull: No acute calvarial abnormality.

Sinuses/Orbits: Visualized paranasal sinuses and mastoids clear.
Orbital soft tissues unremarkable.

Other: None
IMPRESSION: Atrophy, chronic microvascular disease.

No acute intracranial abnormality.

## 2021-06-11 IMAGING — CR DG HIP (WITH OR WITHOUT PELVIS) 2-3V*R*
1 series · 3 of 3 positions shown · non-contrast
Comparison: 08/28/2019.  08/02/2018.

CLINICAL DATA: Right hip pain.  Dislocation.

EXAM:
DG HIP (WITH OR WITHOUT PELVIS) 2-3V RIGHT

[Series 1: dg hip unilat w or w/o pelvis 2-3 views  · non-contrast · 0.14mm/px · 3 of 3 slices shown]
[im 1/3]
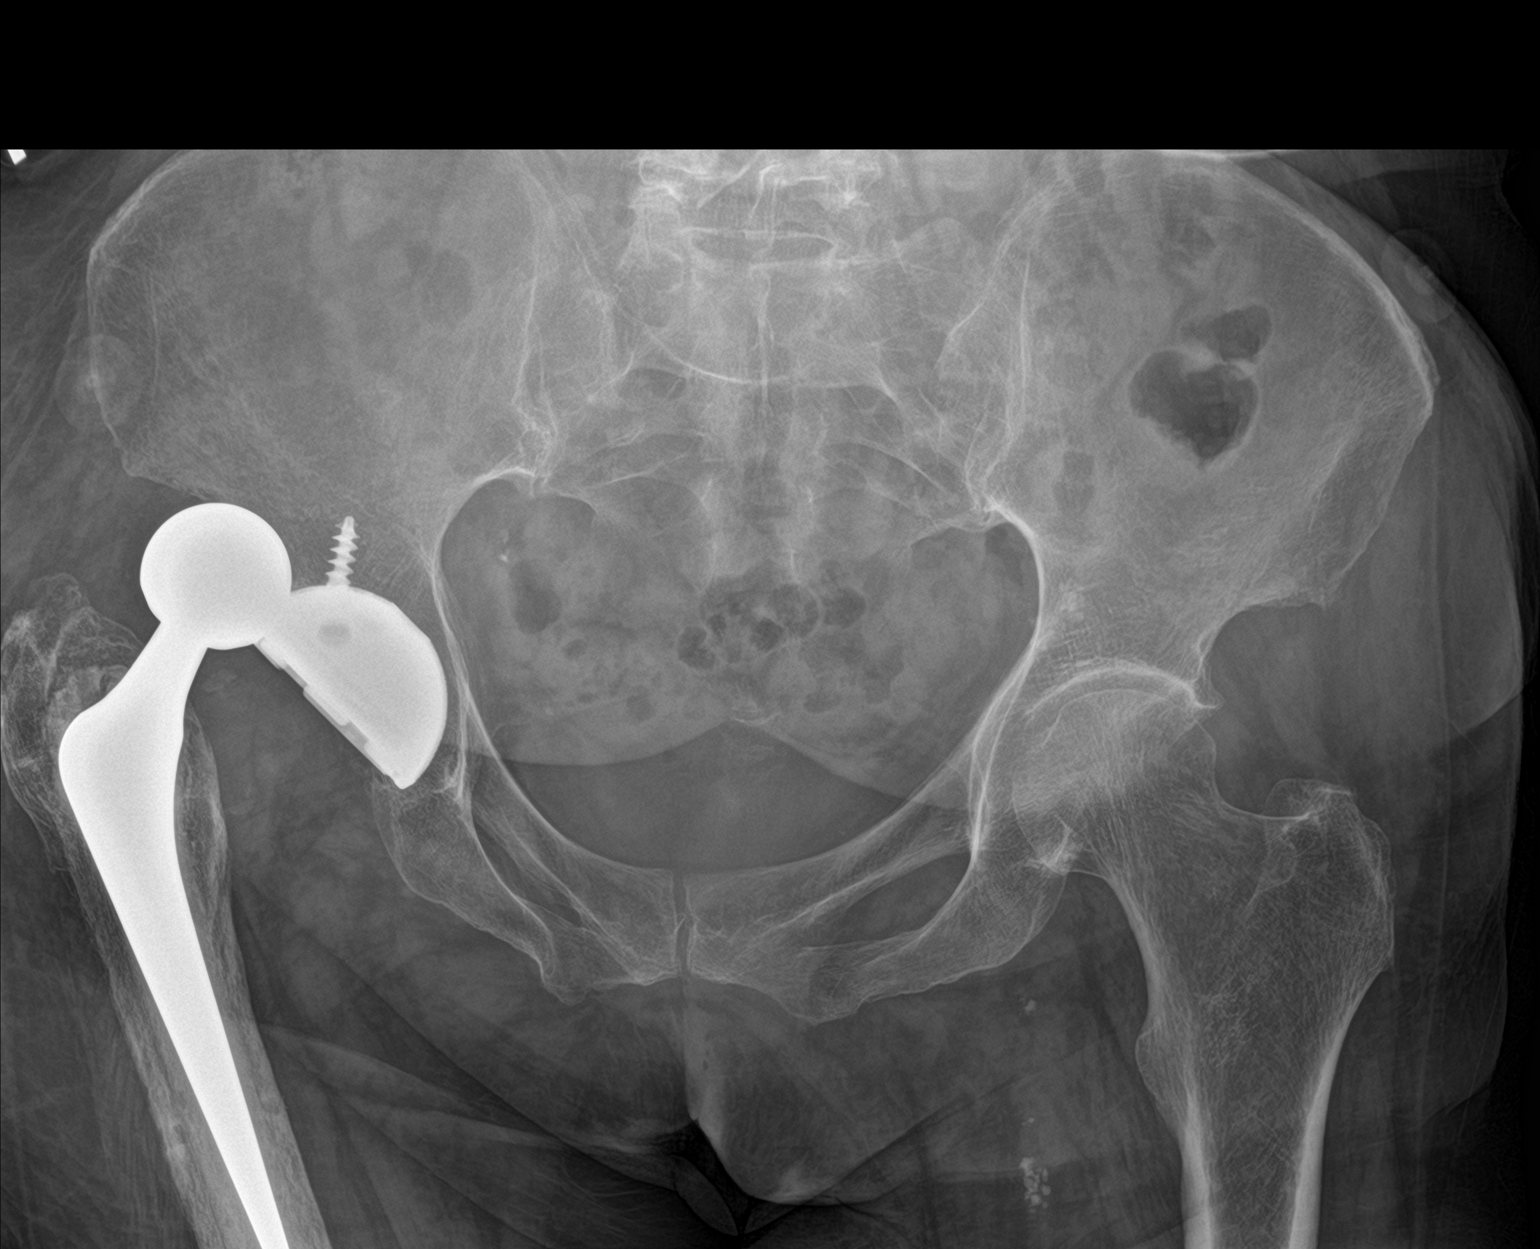
[im 2/3]
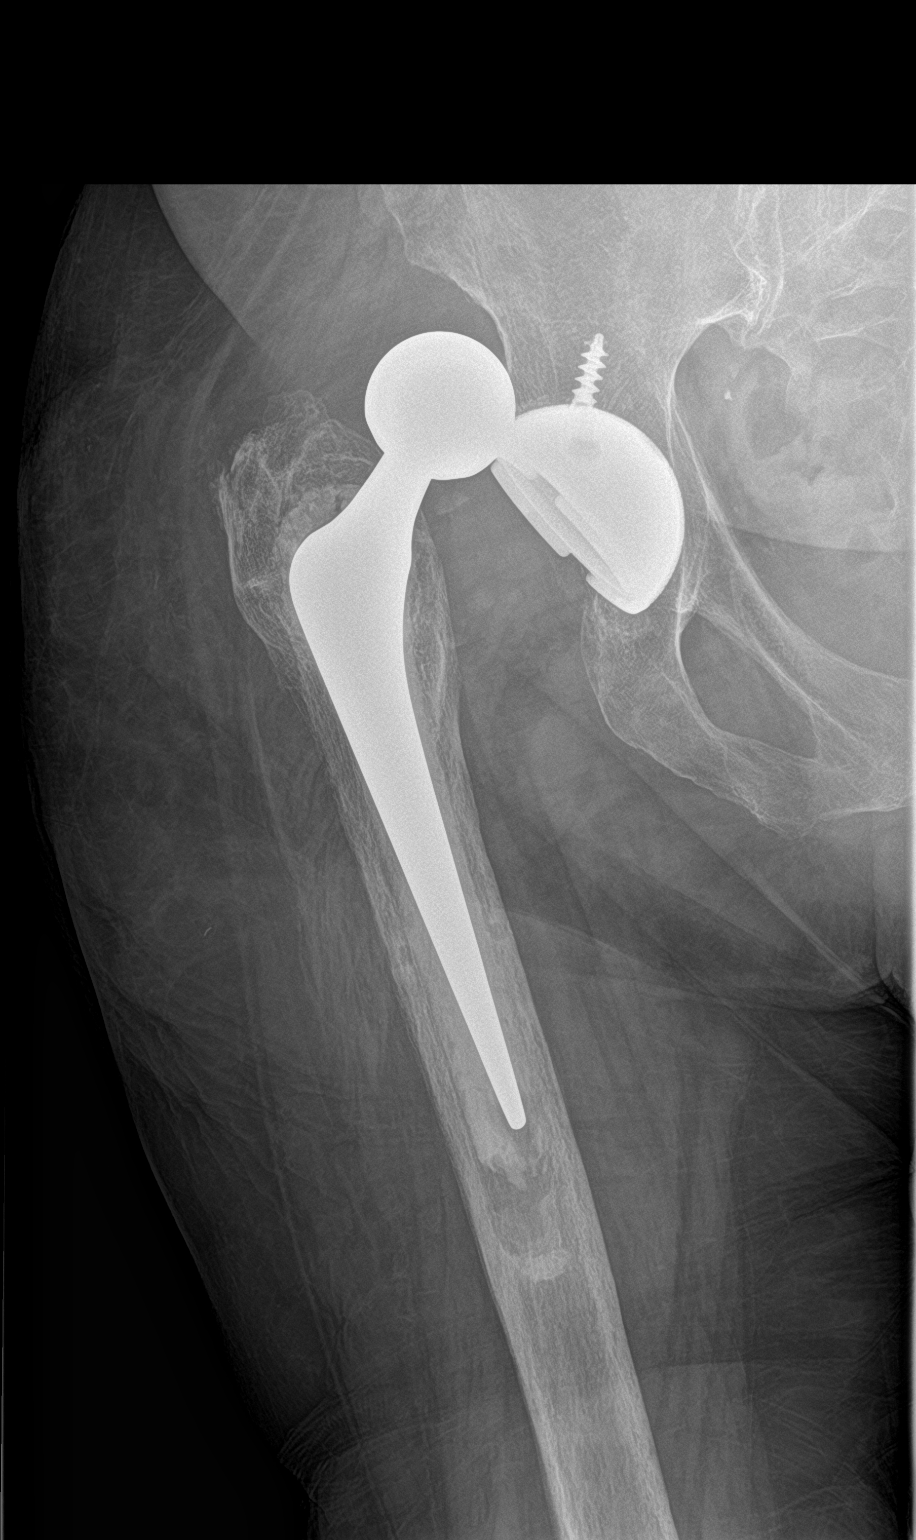
[im 3/3]
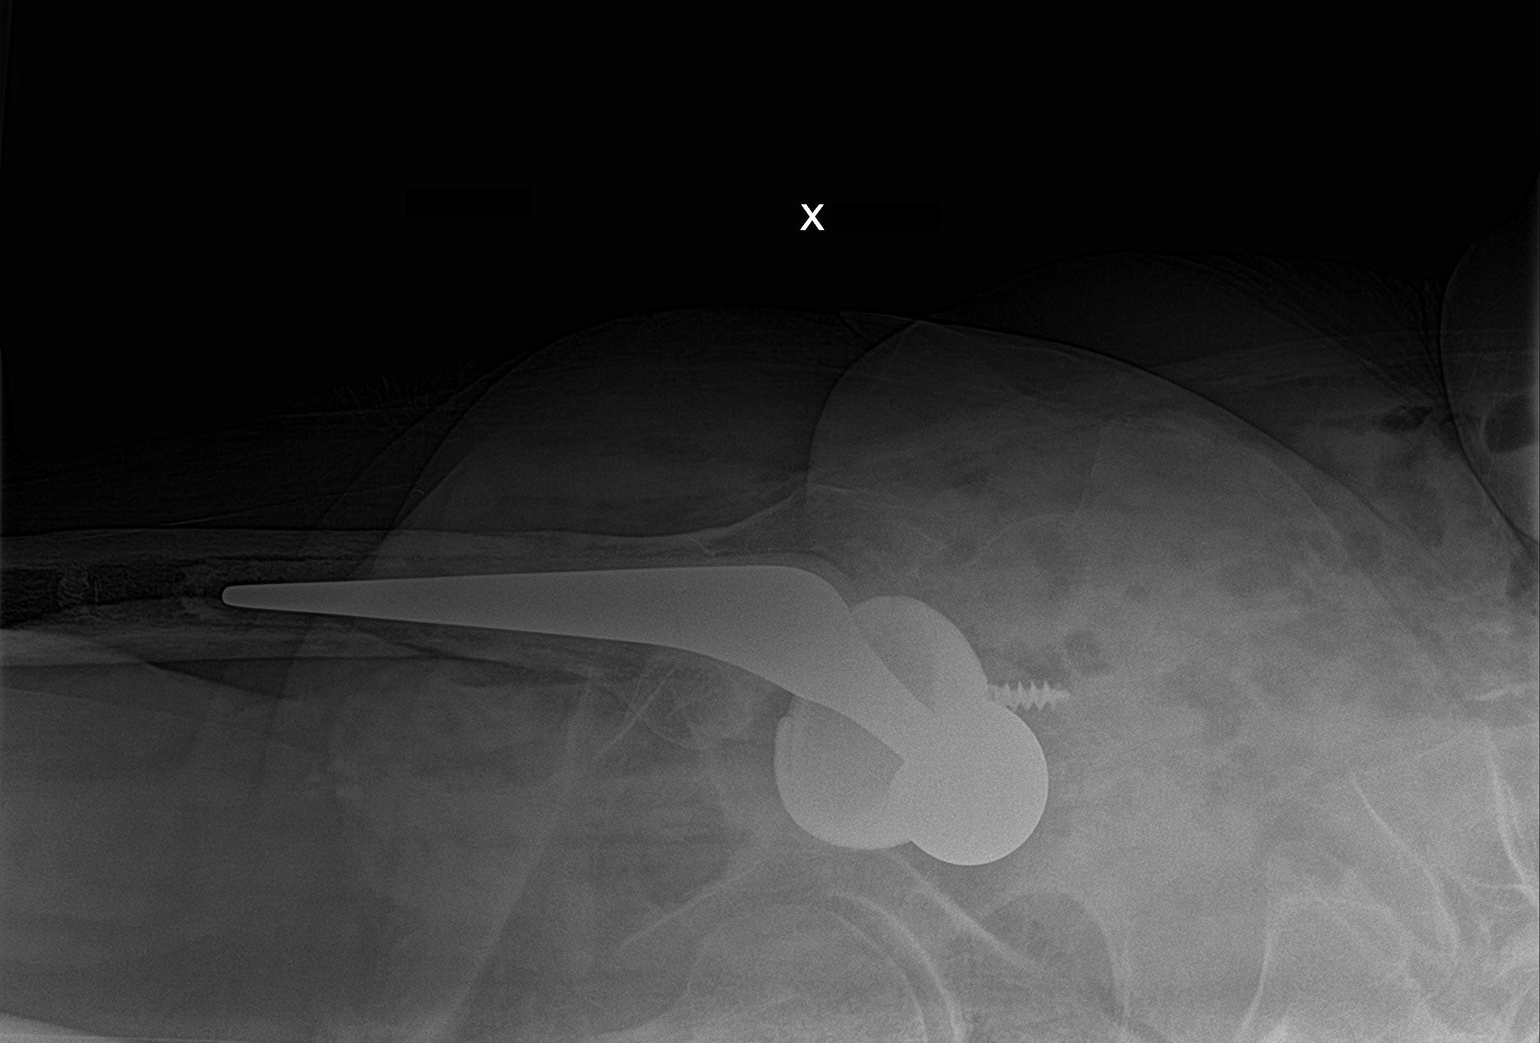

[3 of 3 positions shown; findings below may reference images not displayed]

FINDINGS: Dislocation of right total hip replacement. Hardware intact. Tiny
corticated well-circumscribed bony density noted adjacent to the
femoral prosthesis is most likely postsurgical. No significant
fracture identified. Diffuse osteopenia. Peripheral vascular
calcification.
IMPRESSION: Dislocation of right total hip replacement.

## 2021-06-11 IMAGING — CR DG HIP (WITH OR WITHOUT PELVIS) 2-3V*R*
1 series · 3 of 3 positions shown · non-contrast
Comparison: Earlier today

CLINICAL DATA: Right hip reduction

EXAM:
DG HIP (WITH OR WITHOUT PELVIS) 2-3V RIGHT

[Series 1: dg hip unilat w or w/o pelvis 2-3 views  · non-contrast · 0.14mm/px · 3 of 3 slices shown]
[im 1/3]
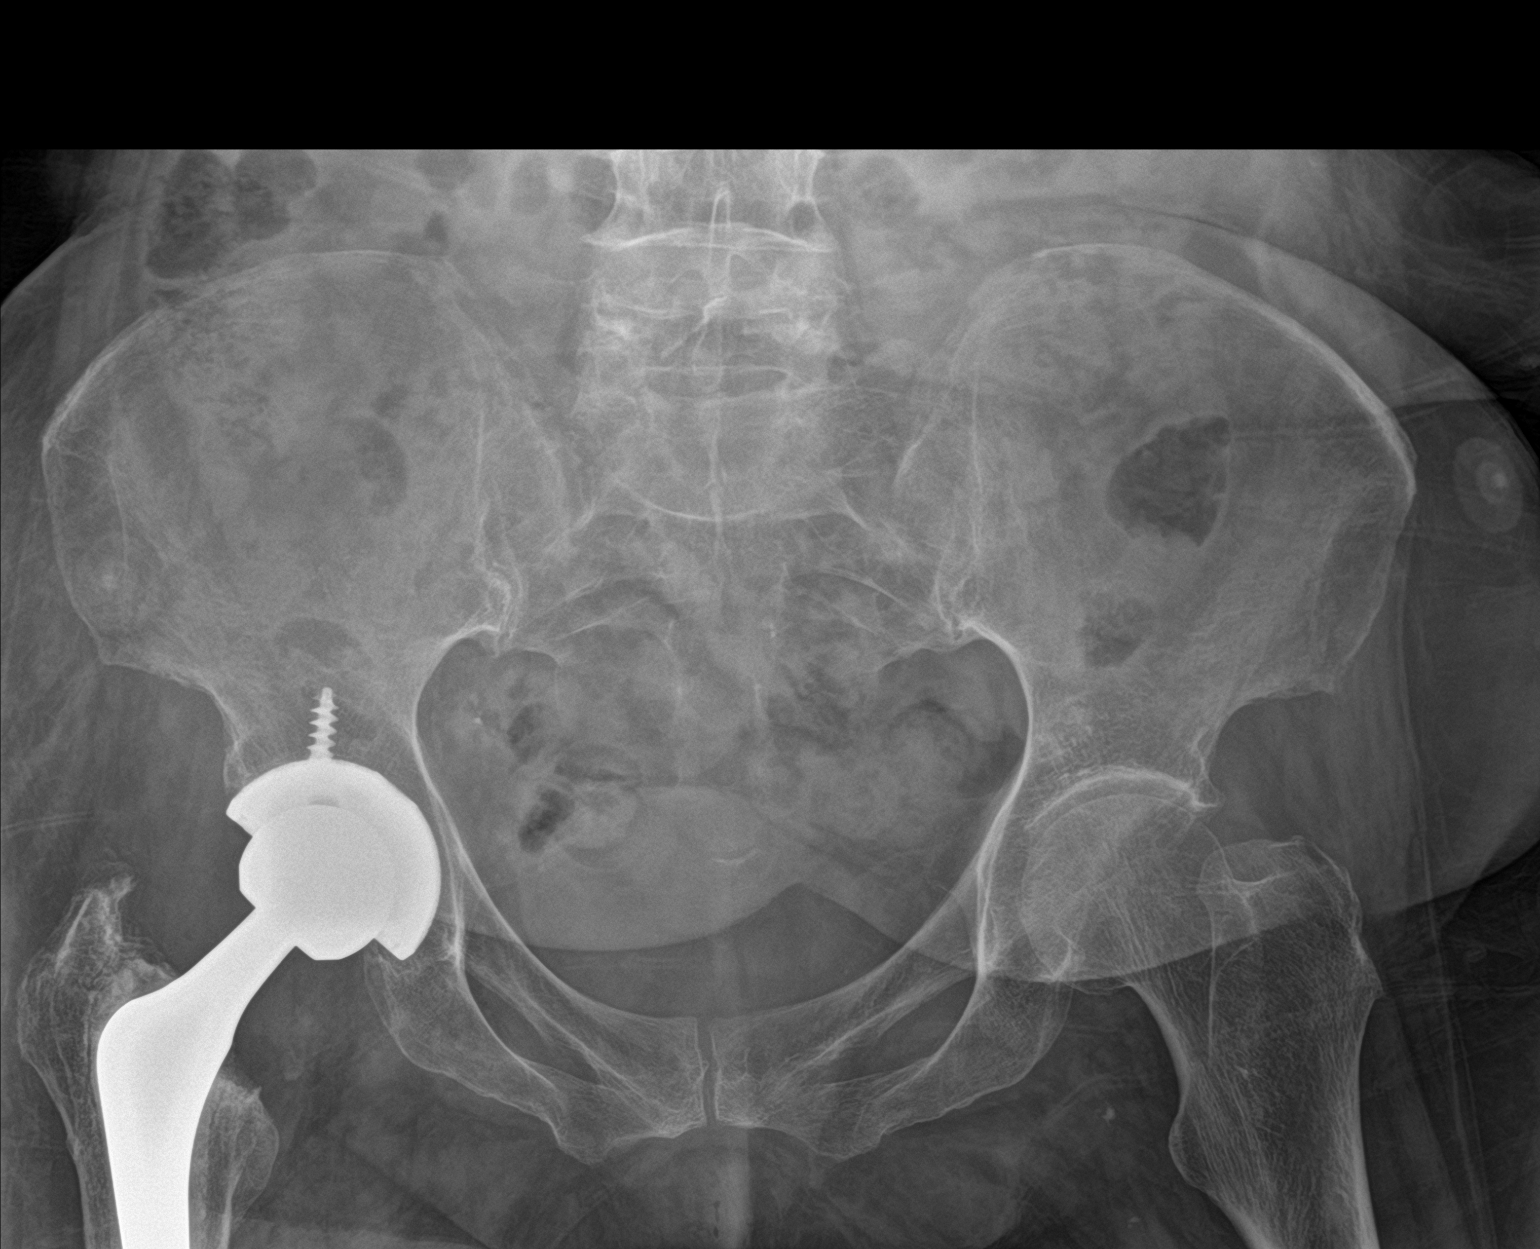
[im 2/3]
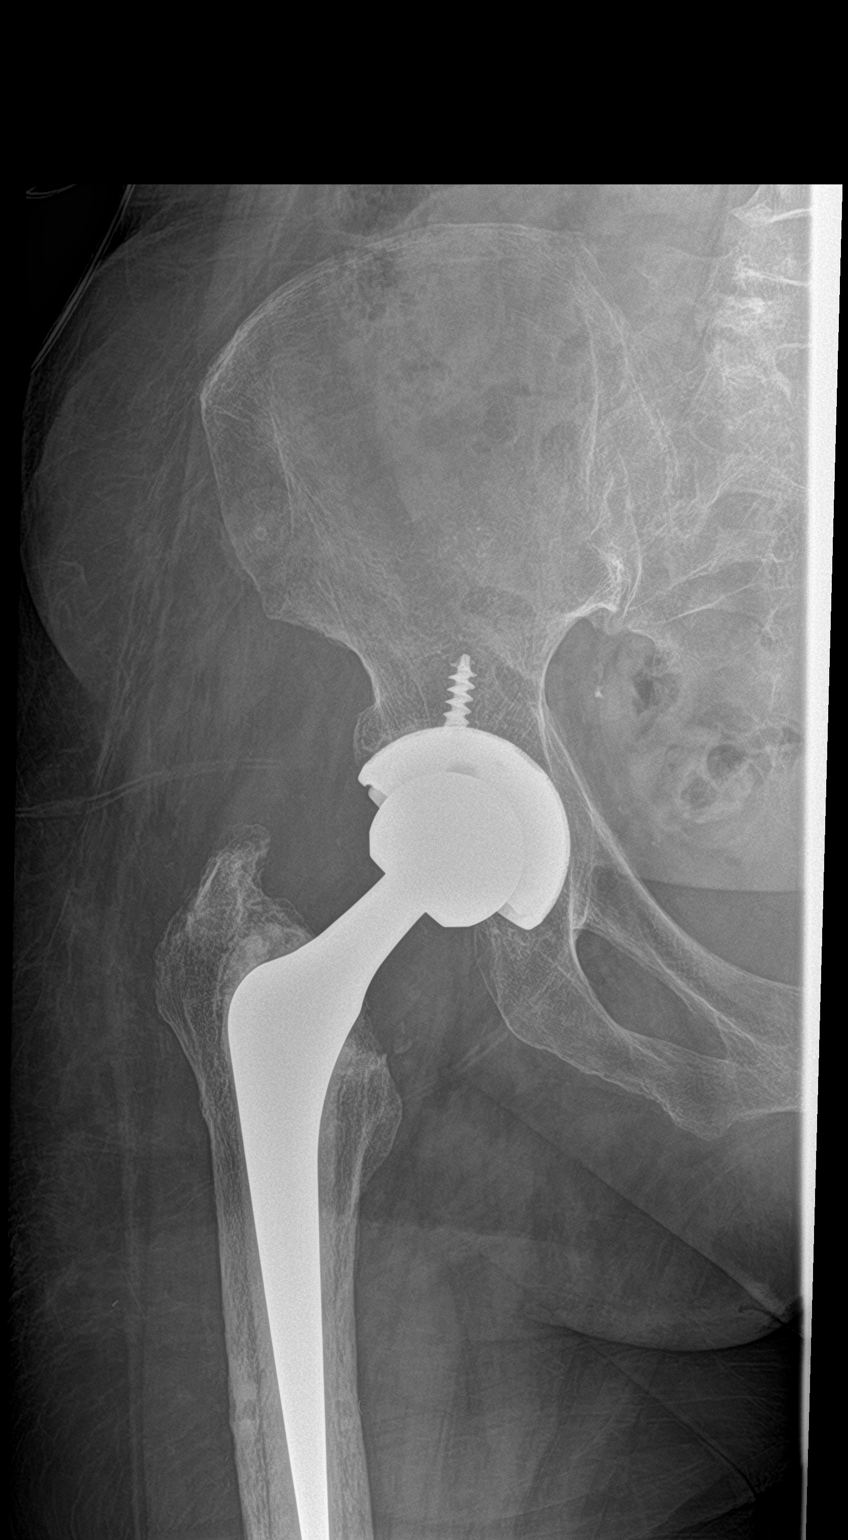
[im 3/3]
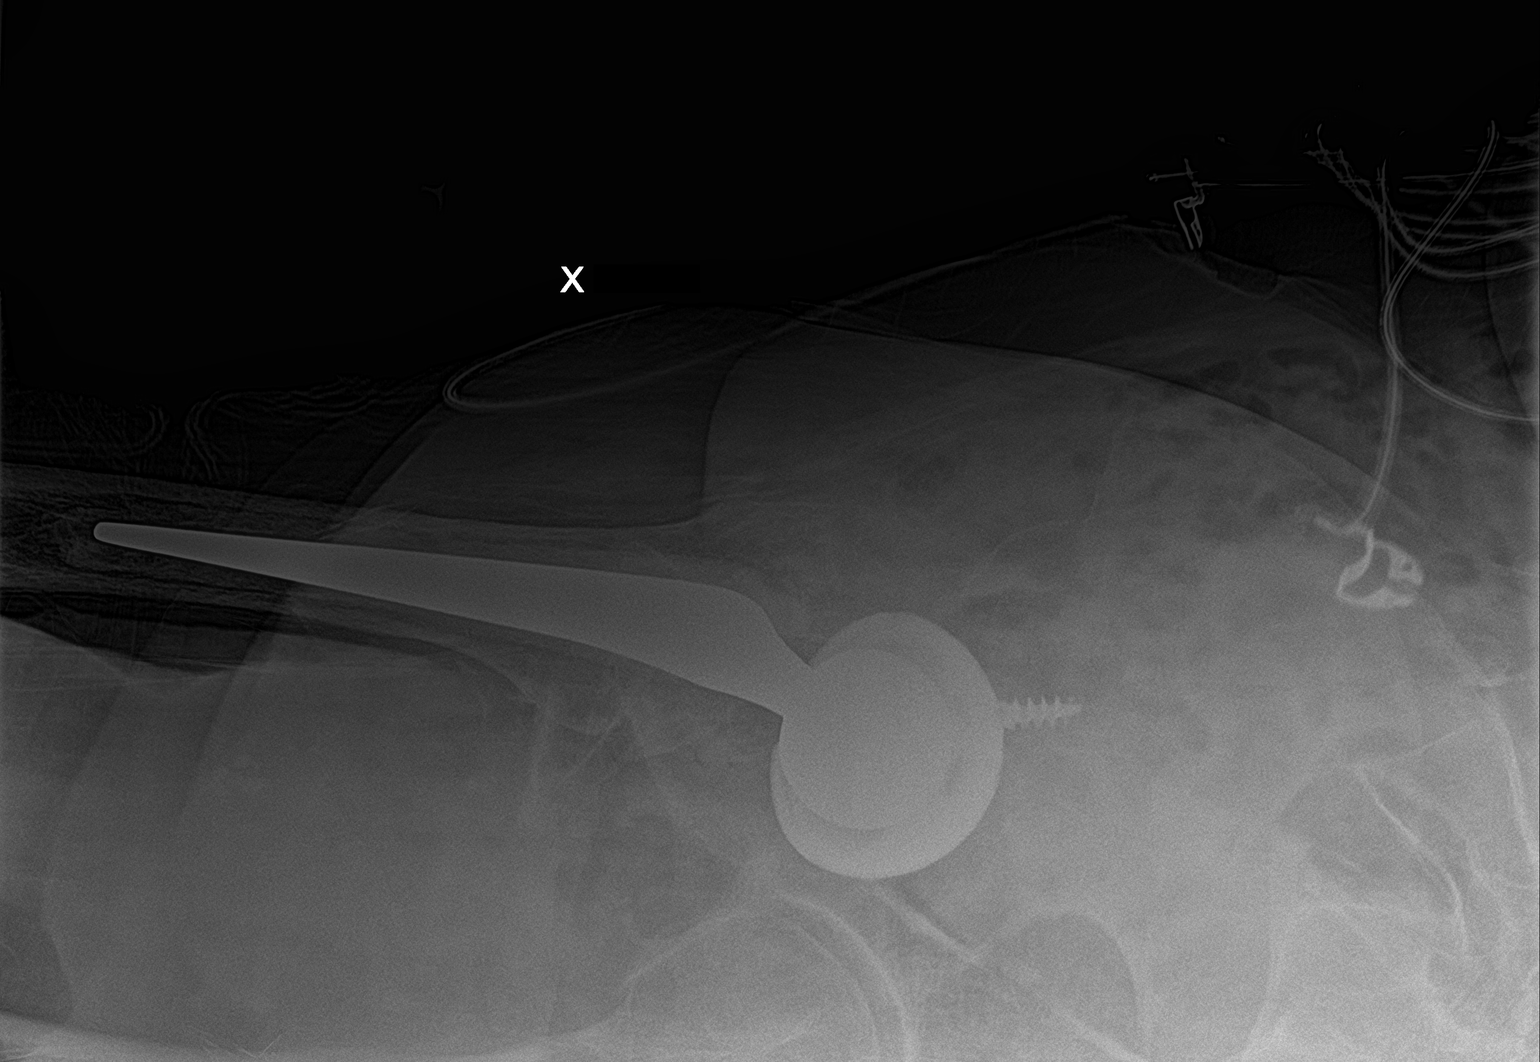

[3 of 3 positions shown; findings below may reference images not displayed]

FINDINGS: Interval reduction of the total right hip arthroplasty. The tip of
the femoral stem is only covered on the lateral view. No evidence of
periprosthetic fracture. Mild lucency around the femoral component
is unchanged.
IMPRESSION: Reduction of the right hip arthroplasty without complicating
fracture.

## 2021-10-06 IMAGING — DX DG CHEST 2V
3 series · 3 of 3 positions shown · non-contrast
Comparison: July 15, 2019

CLINICAL DATA: Lower right chest pain.

EXAM:
CHEST - 2 VIEW

[chest lat (1 of 2)]
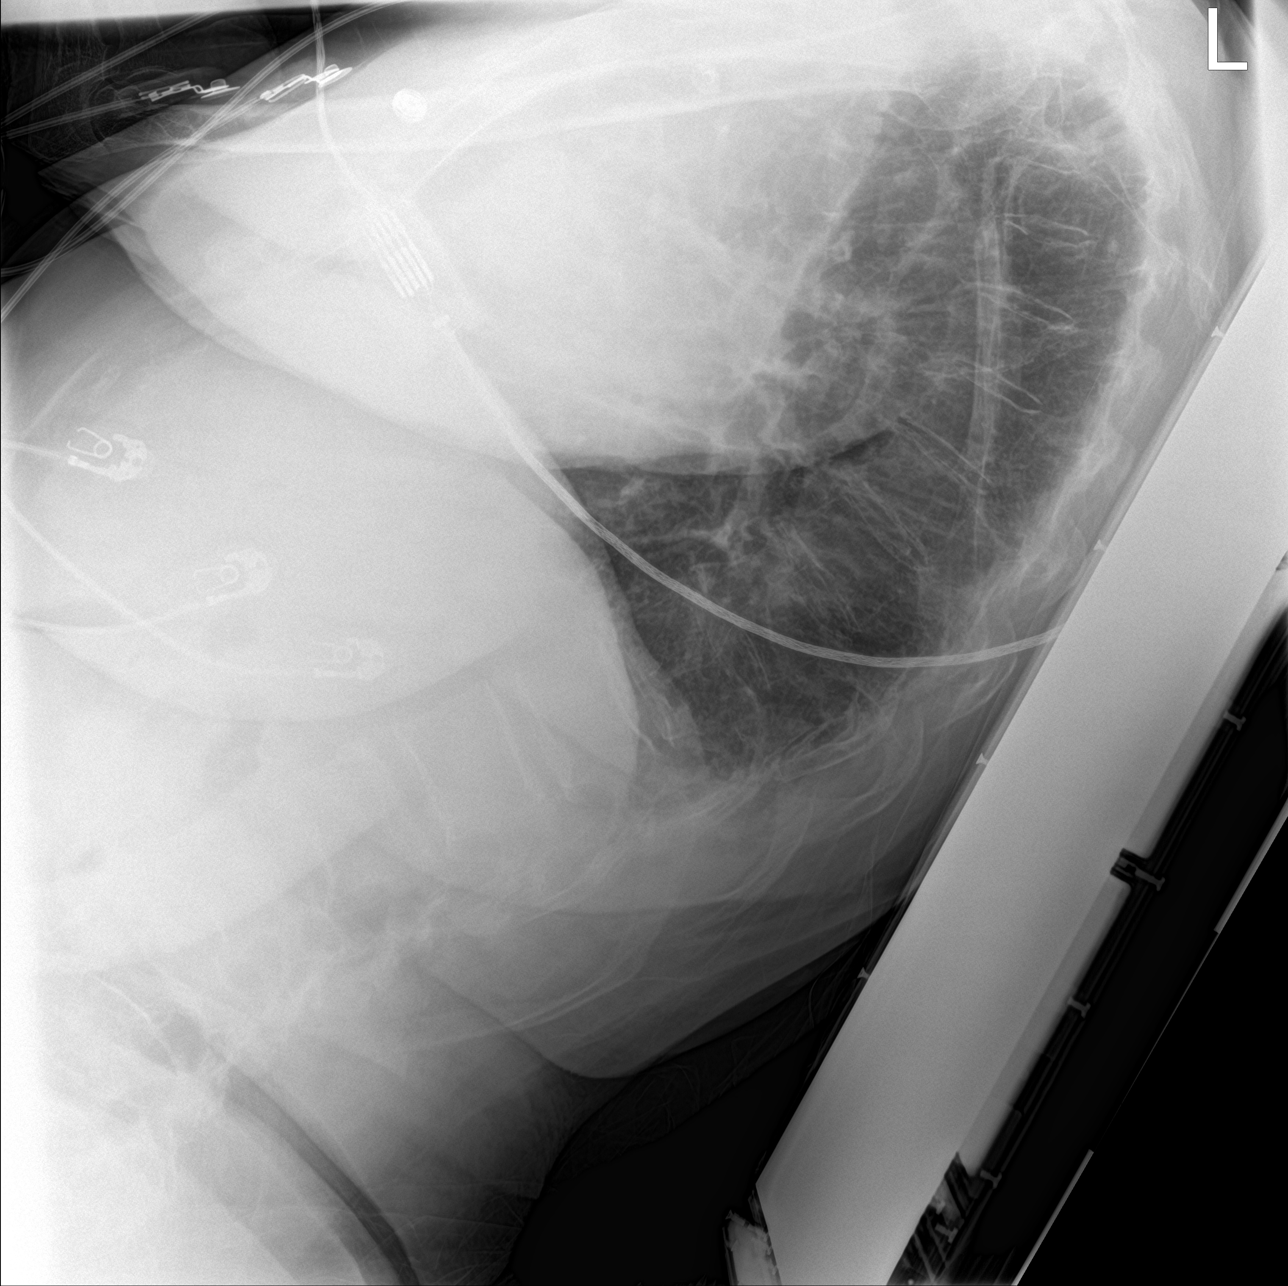

[chest ap]
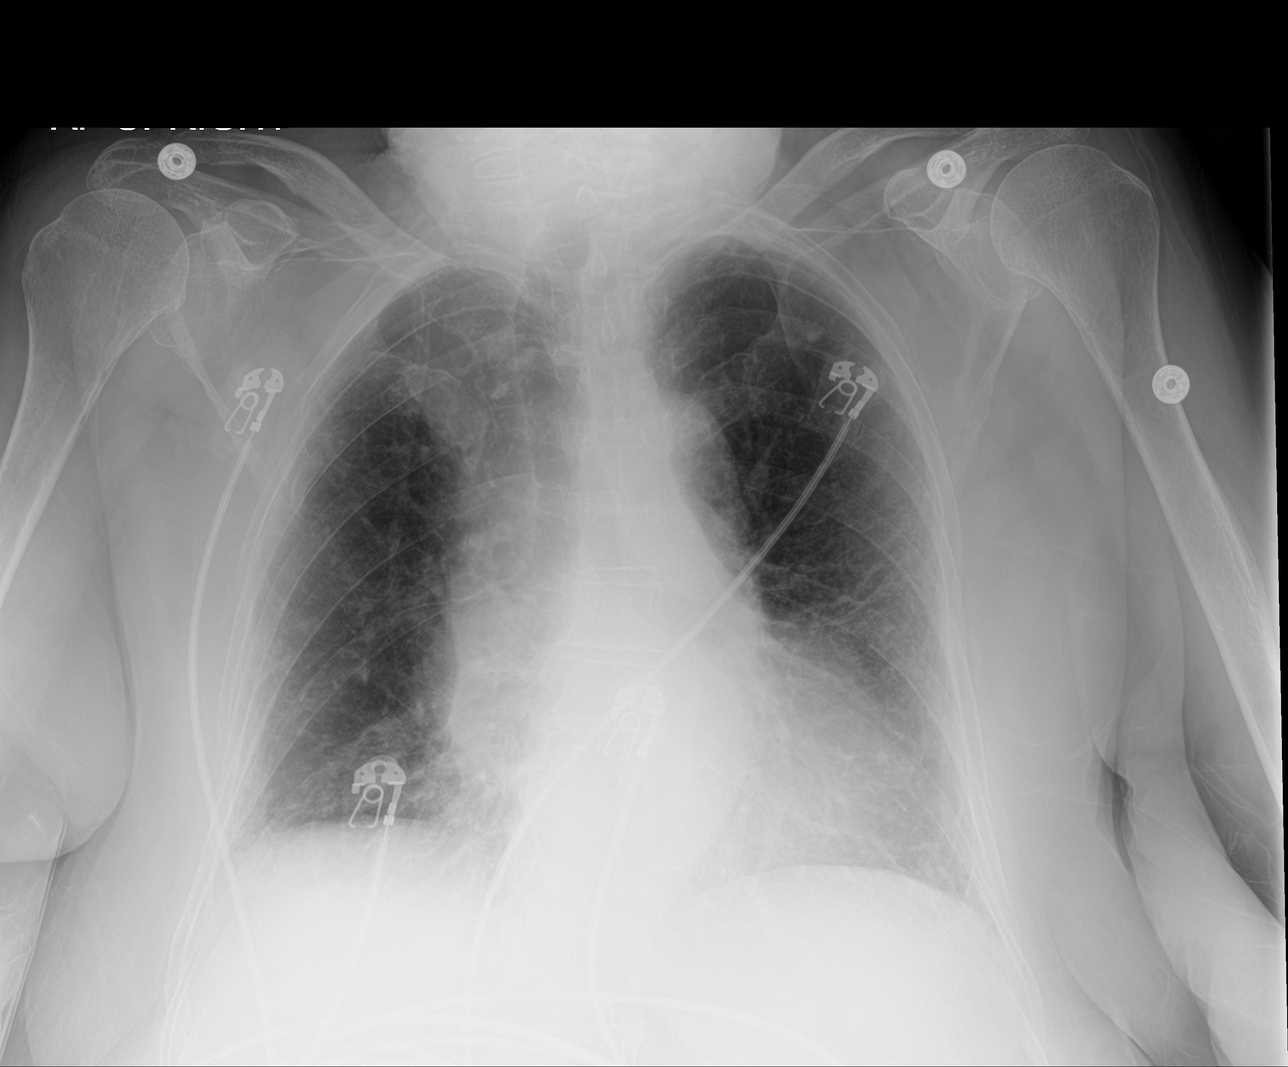

[chest lat (2 of 2)]
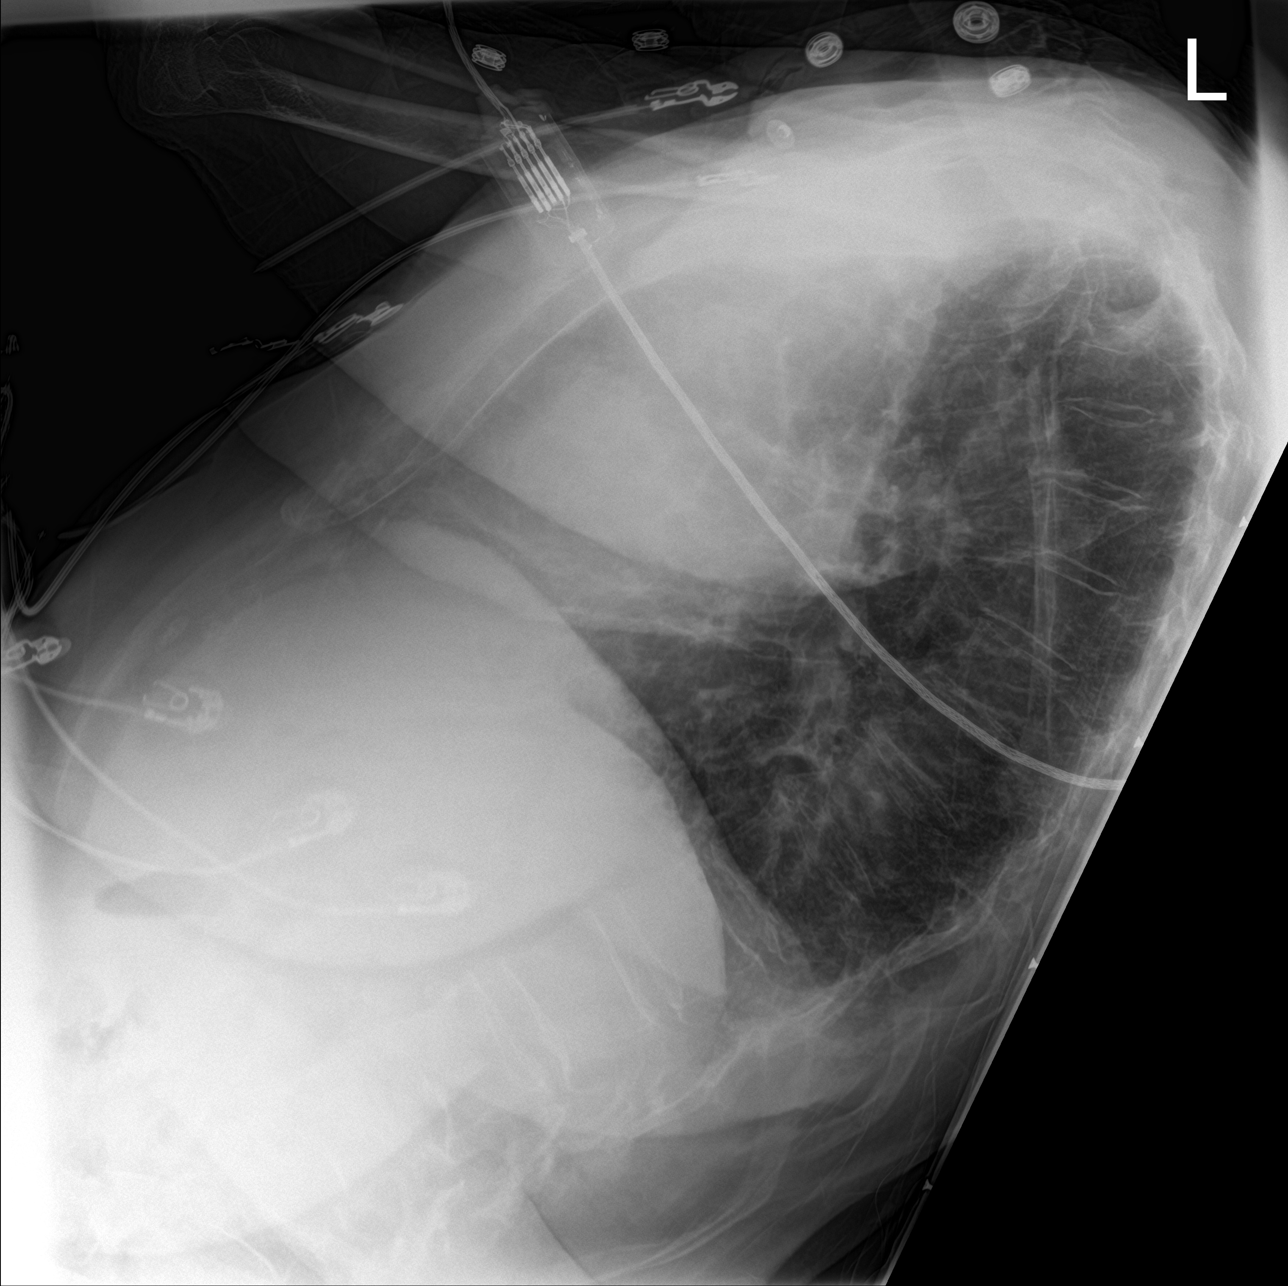

[3 of 3 positions shown; findings below may reference images not displayed]

FINDINGS: Mild, diffuse chronic appearing increased interstitial lung markings
are noted. This is slightly more prominent within the bilateral
lower lobes. There is no evidence of acute infiltrate, pleural
effusion or pneumothorax. The cardiac silhouette is mildly enlarged.
There is tortuosity of the descending thoracic aorta. The visualized
skeletal structures are unremarkable.
IMPRESSION: Chronic appearing increased interstitial lung markings without
evidence of acute or active cardiopulmonary disease.

## 2021-10-07 IMAGING — CT CT ANGIO CHEST
2 of 7 series · 18 of 46 positions shown · IV contrast (APPLIED)
Comparison: CTA chest 05/17/2018. CT Abdomen and Pelvis today
reported separately.

CLINICAL DATA: 79-year-old female with chest and abdominal pain.
Right upper quadrant and flank pain since 1011 hours.

EXAM:
CT ANGIOGRAPHY CHEST WITH CONTRAST
TECHNIQUE: Multidetector CT imaging of the chest was performed using the
standard protocol during bolus administration of intravenous
contrast. Multiplanar CT image reconstructions and MIPs were
obtained to evaluate the vascular anatomy.
CONTRAST:  100mL OMNIPAQUE IOHEXOL 350 MG/ML SOLN

[Series 5: thins · axial · 0.66mm/px · z∈[-316,-50]mm · 15 of 427 slices shown]
[im 24/427  lung]
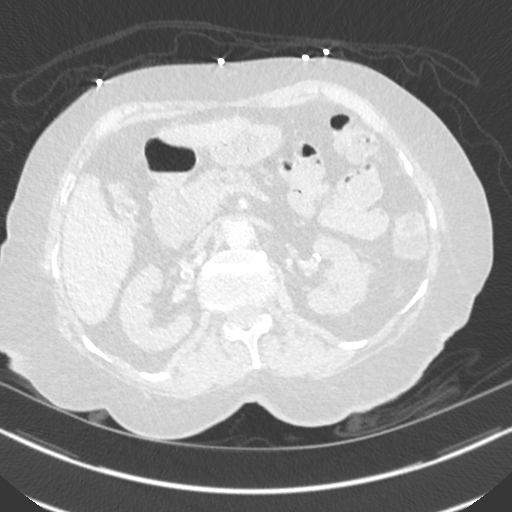
[im 48/427  soft-tissue]
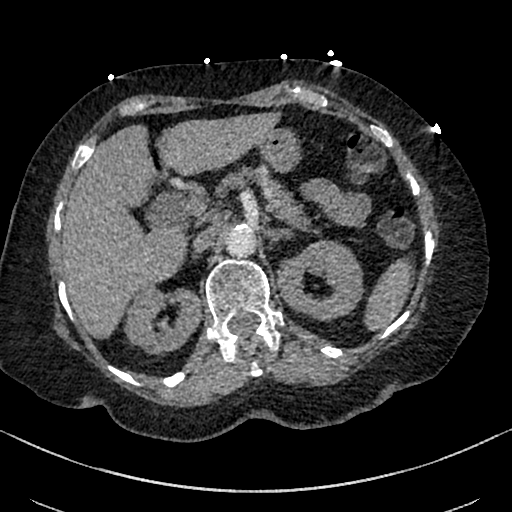
[im 72/427  lung]
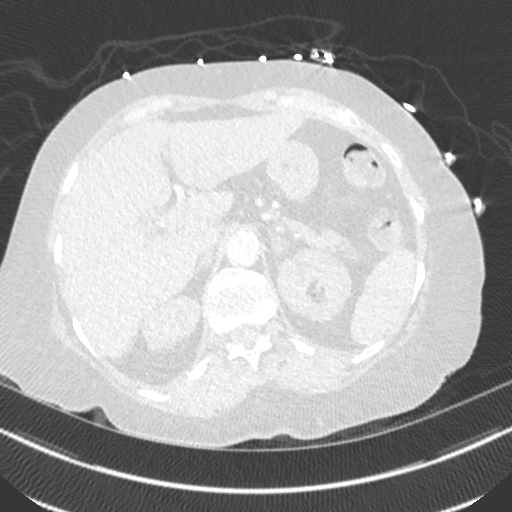
[im 95/427  soft-tissue]
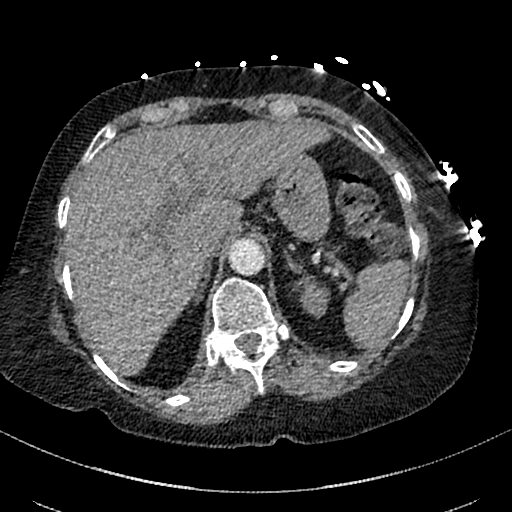
[im 143/427  lung]
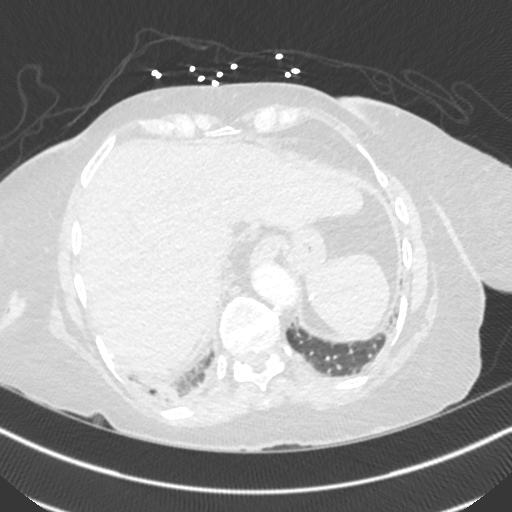
[im 166/427  soft-tissue]
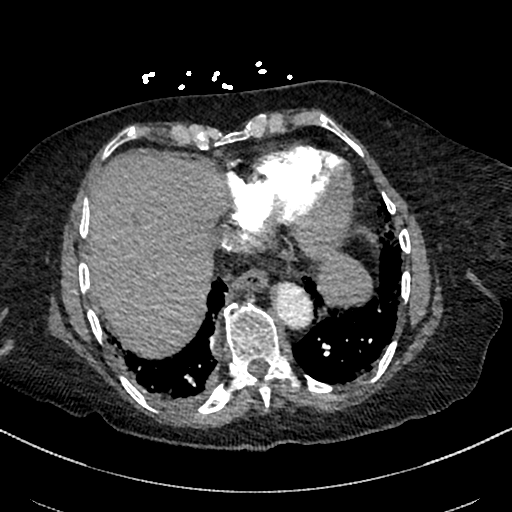
[im 190/427  lung]
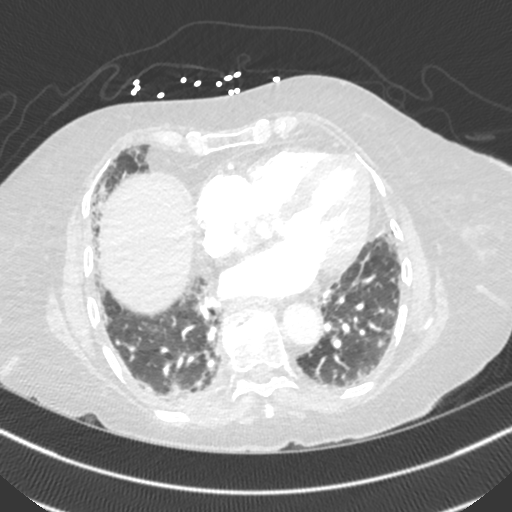
[im 214/427  soft-tissue]
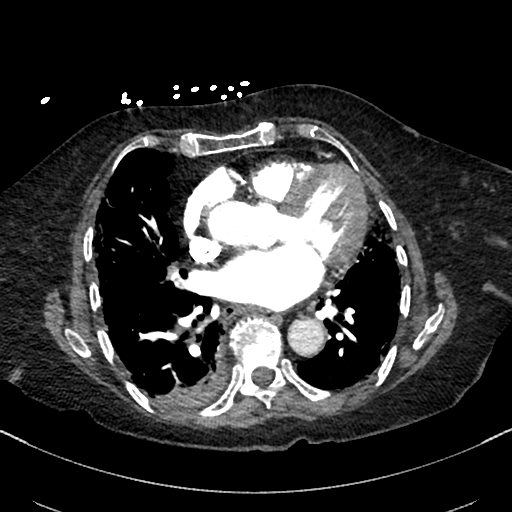
[im 237/427  lung]
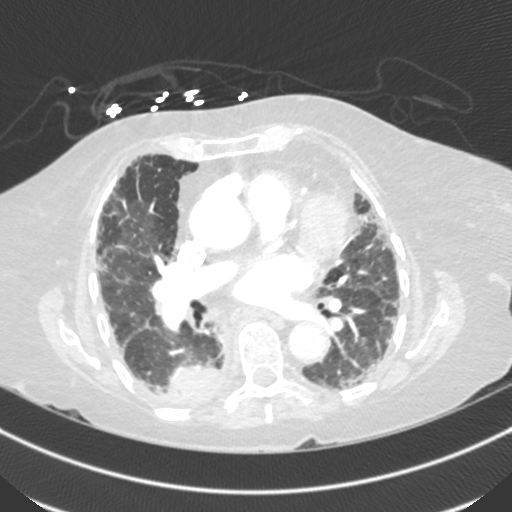
[im 261/427  soft-tissue]
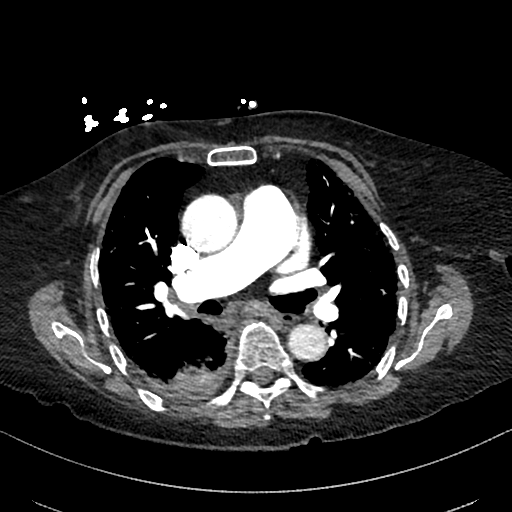
[im 285/427  lung]
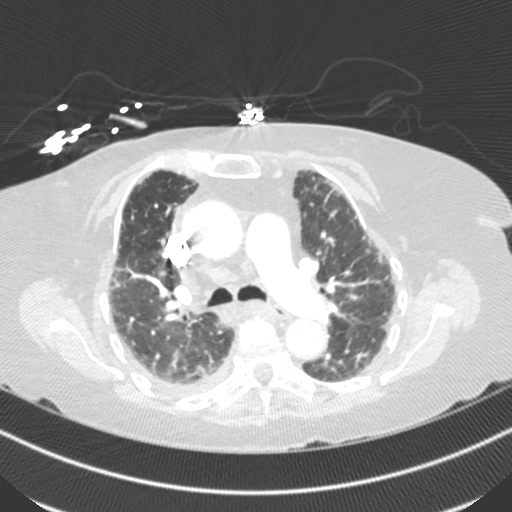
[im 332/427  soft-tissue]
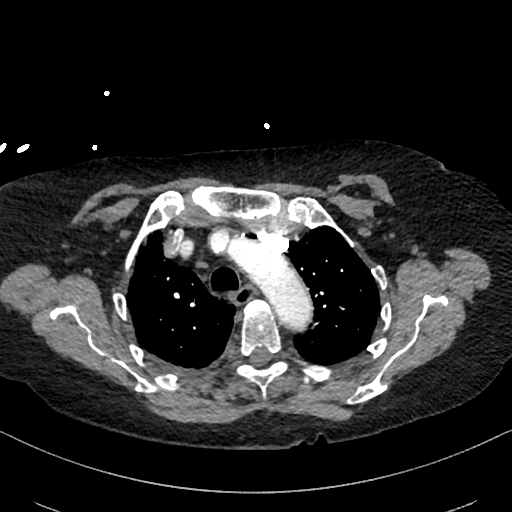
[im 356/427  lung]
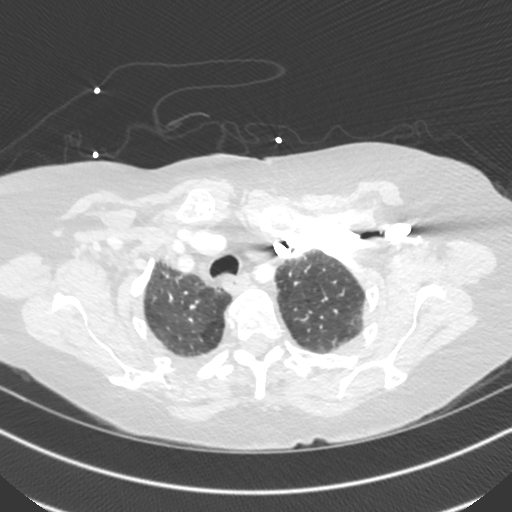
[im 379/427  soft-tissue]
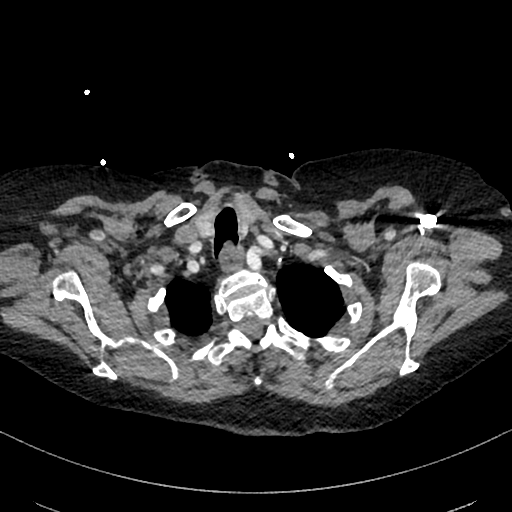
[im 403/427  lung]
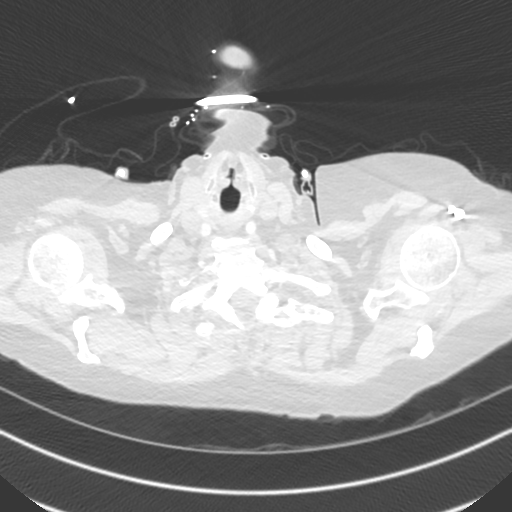

[Series 6: cor · coronal · 0.58mm/px · 3 of 132 slices shown]
[im 33/132  soft-tissue]
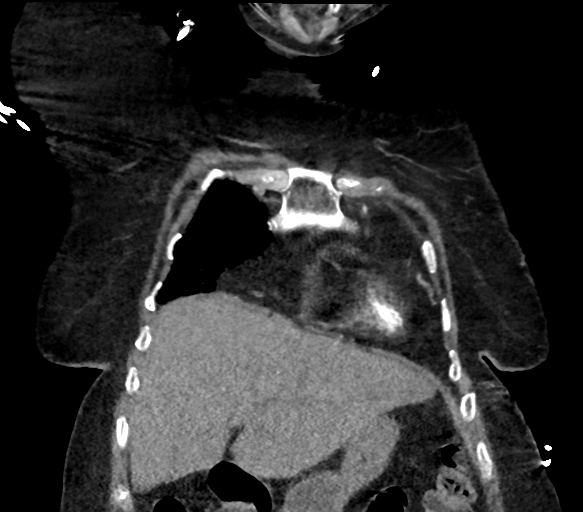
[im 66/132  soft-tissue]
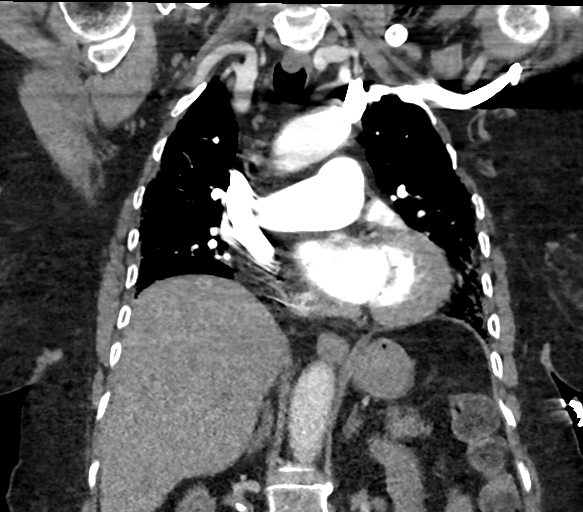
[im 99/132  soft-tissue]
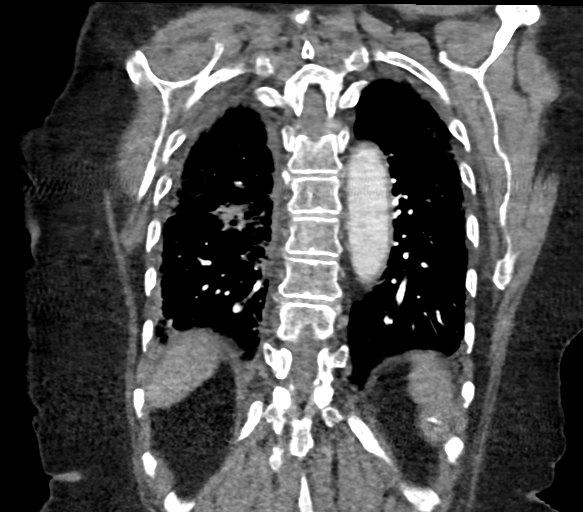

[18 of 46 positions shown; findings below may reference images not displayed]

FINDINGS: Cardiovascular: Excellent contrast bolus timing in the pulmonary
arterial tree.

No focal filling defect identified in the pulmonary arteries to
suggest acute pulmonary embolism.

Calcified coronary artery atherosclerosis. Cardiac size remains
within normal limits. No pericardial effusion. Negative visible
aorta aside from atherosclerosis.

Mediastinum/Nodes: Stable mediastinal lymph nodes since 9718, such
as due to chronic or recurrent pulmonary inflammation.

Lungs/Pleura: Lower lung volumes. Chronic pulmonary fibrosis. Major
airways remain patent. New right lung pleural thickening or trace
pleural effusion with superimposed rounded roughly 3 cm superior
segment lower lobe opacity with some internal gas or cavitation
(series 4, image 60), new since 9718. But stable lung opacity
elsewhere.

Upper Abdomen: Negative visible liver, spleen (calcified
granulomas), pancreas, adrenal glands, kidneys and bowel in the
upper abdomen.

Musculoskeletal: Multilevel upper thoracic and lower cervical
compression fractures appear stable. No acute osseous abnormality
identified.

Review of the MIP images confirms the above findings.
IMPRESSION: 1. No evidence of acute pulmonary embolus.

2. Chronic Pulmonary Fibrosis with a new 3 cm rounded opacity in the
superior segment right lower lobe since 9718. Superimposed new right
lung pleural thickening or trace pleural fluid.
This is indeterminate for pneumonia versus mass.
Recommend one of the following: (a) repeat chest CT in 3 months, (b)
follow-up PET-CT, or (c) tissue sampling. This recommendation
adapted from the consensus statement: Guidelines for Management of
Incidental Pulmonary Nodules Detected on CT Images: From the

3.  CT Abdomen, and Pelvis today are reported separately.

4. Calcified coronary artery atherosclerosis. Aortic Atherosclerosis
([id number].X).

## 2021-10-07 IMAGING — CT CT ABD-PELV W/ CM
2 of 5 series · 14 of 46 positions shown, 16 images · IV contrast (omnipaque)
Comparison: CTA chest today reported separately.

CT Abdomen and Pelvis 06/18/2019

CLINICAL DATA: 79-year-old female with chest and abdominal pain.
Right upper quadrant and flank pain since 9199 hours.

EXAM:
CT ABDOMEN AND PELVIS WITH CONTRAST
TECHNIQUE: Multidetector CT imaging of the abdomen and pelvis was performed
using the standard protocol following bolus administration of
intravenous contrast.
CONTRAST:  100mL OMNIPAQUE IOHEXOL 350 MG/ML SOLN

[Series 5: abdomen 5.0 · axial · 0.73mm/px · z∈[-612,-207]mm · 11 of 95 slices shown, 13 images]
[im 7/95  soft-tissue]
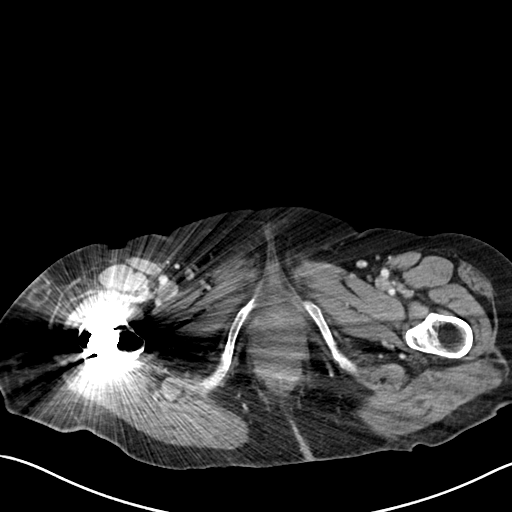
[im 7/95  bone]
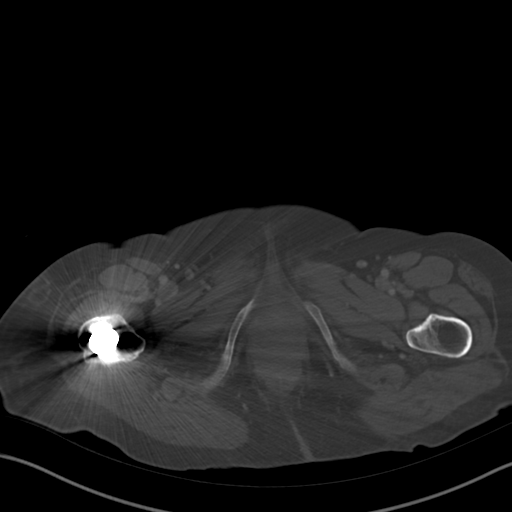
[im 13/95  soft-tissue]
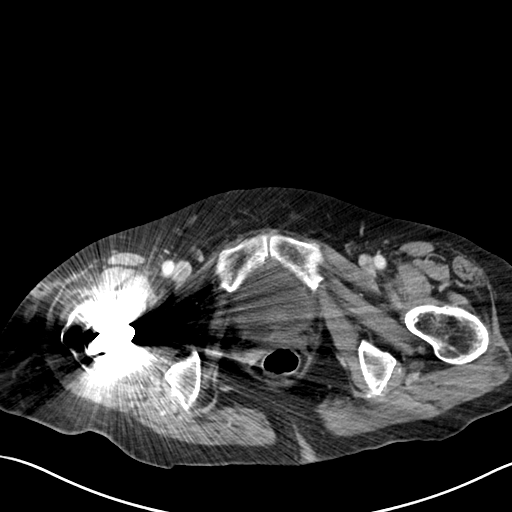
[im 26/95  soft-tissue]
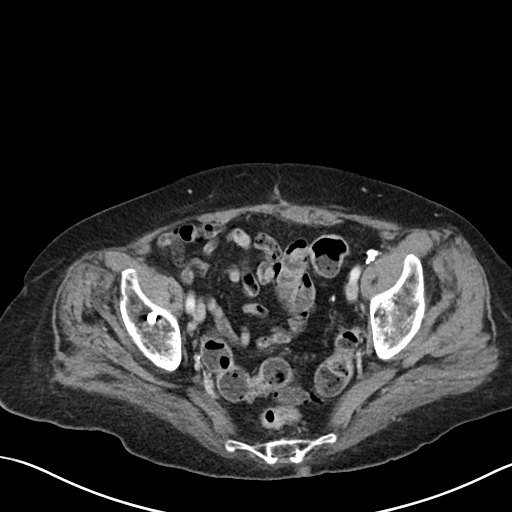
[im 32/95  soft-tissue]
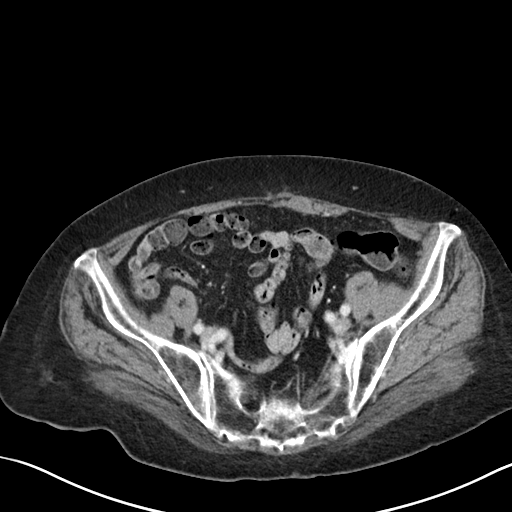
[im 38/95  soft-tissue]
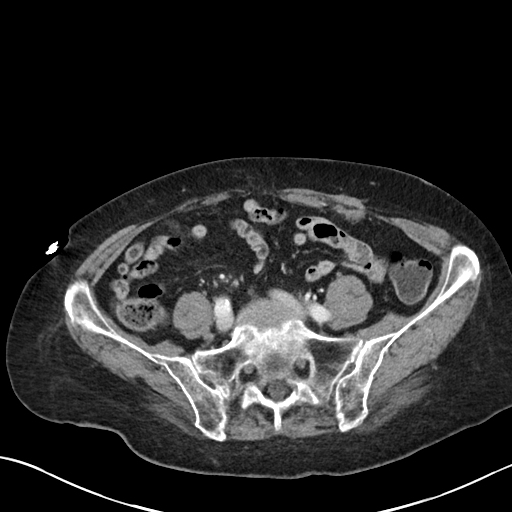
[im 51/95  soft-tissue]
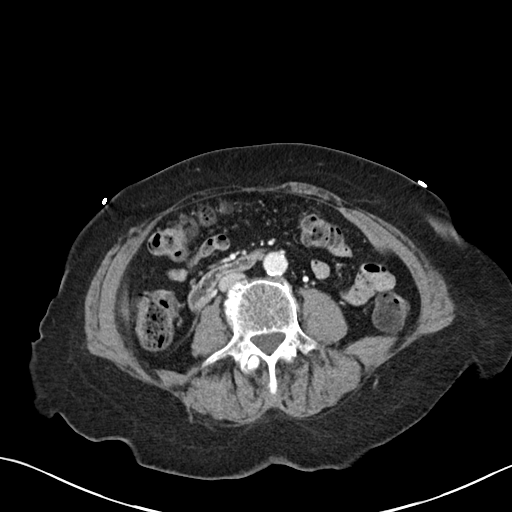
[im 57/95  soft-tissue]
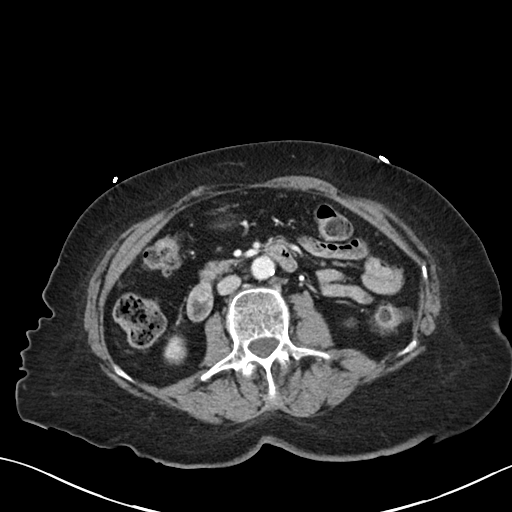
[im 63/95  soft-tissue]
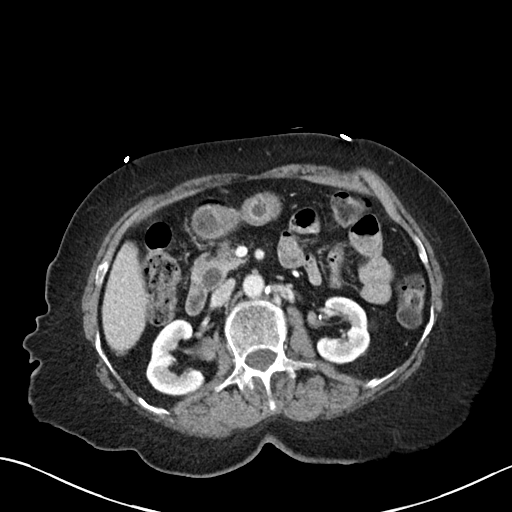
[im 69/95  soft-tissue]
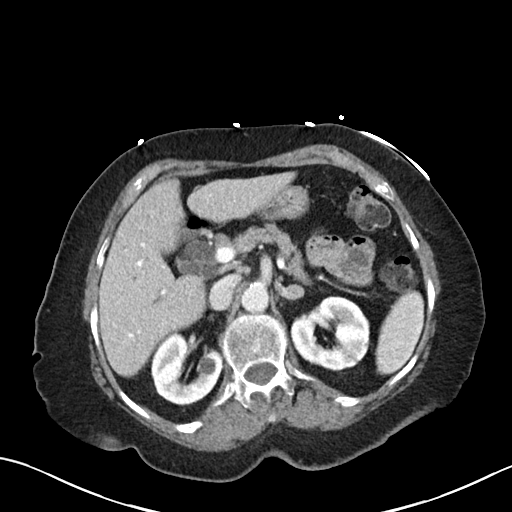
[im 69/95  bone]
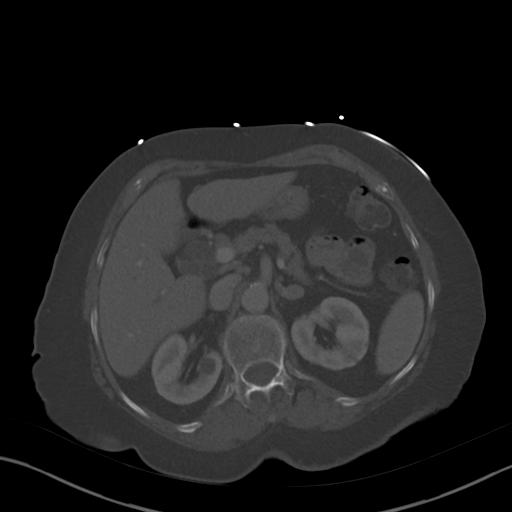
[im 82/95  soft-tissue]
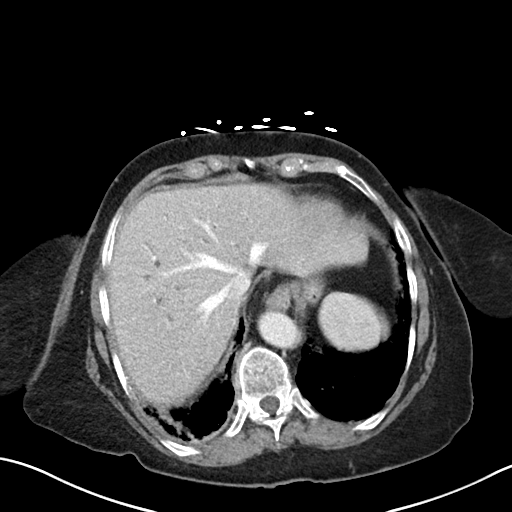
[im 88/95  soft-tissue]
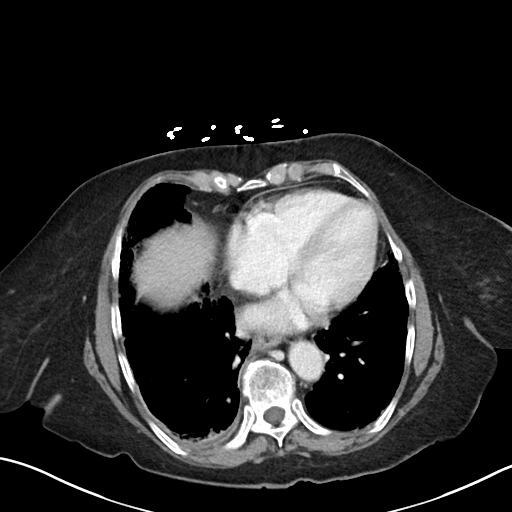

[Series 8: abdomen 3.0 mpr cor · coronal · 0.63mm/px · 3 of 92 slices shown]
[im 31/92  soft-tissue]
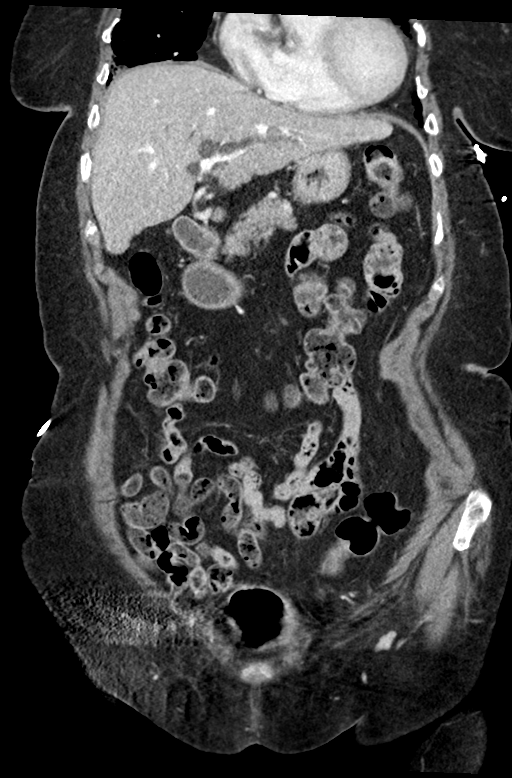
[im 41/92  soft-tissue]
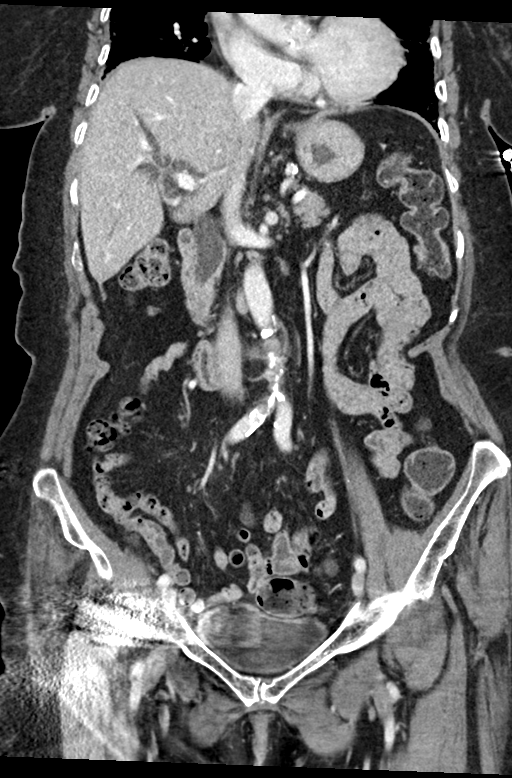
[im 51/92  soft-tissue]
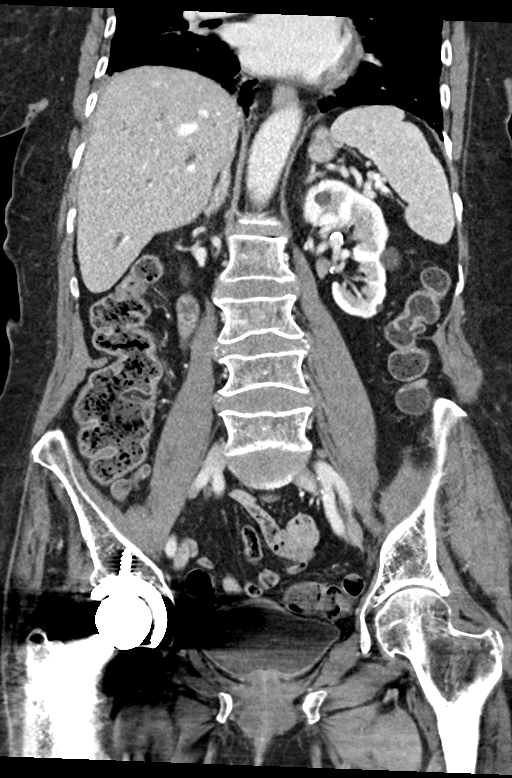

[14 of 46 positions shown; findings below may reference images not displayed]

FINDINGS: Lower chest: On these images there is evidence of both pleural
thickening with enhancement and intervening trace pleural fluid in
the posterior right hemithorax (series 5, image 2), new since
[REDACTED]. Some of the rounded right lower lobe superior segment
opacity is also visible on these images, see chest CTA for
additional details.

Hepatobiliary: Chronically absent gallbladder. Intra and
extrahepatic biliary ductal dilatation with CBD measuring up to 15
mm diameter, although this appears similar to the Rudi CT.
However, the CBD has enlarged since 5561 (up to 12 mm at that time.
No filling defect identified within the duct. Liver enhancement
remains within normal limits.

Pancreas: Stable, negative.  No pancreatic ductal enlargement.

Spleen: Stable calcified splenic granulomas.

Adrenals/Urinary Tract: Normal adrenal glands. Both kidneys appear
stable since 5561 with symmetric renal enhancement and contrast
excretion. Normal proximal ureters. There is a small chronic
calcified 9 mm renal artery branch aneurysm at the left hilum which
is stable (coronal image 50).

There is abundant gas within the urinary bladder on series 5, image
79. Mild bladder wall thickening appears stable. No perivesical
stranding is evident.

Stomach/Bowel: Redundant large bowel with mild retained stool
throughout. Diverticulosis of the sigmoid colon with no active
inflammation evident. Decompressed terminal ileum. Diminutive or
absent appendix. No dilated small bowel. Decompressed stomach and
duodenum. No free air, free fluid.

Vascular/Lymphatic: Aortoiliac calcified atherosclerosis. Major
arterial structures remain patent. Portal venous system is patent.
No lymphadenopathy.

Reproductive: Diminutive or absent as before.

Other: Streak artifact in the pelvis, no pelvis free fluid.

Musculoskeletal: Osteopenia. Chronic right hip arthroplasty. No
acute osseous abnormality identified.
IMPRESSION: 1. On these images there is evidence of pleural thickening and trace
pleural fluid in the right lower lobe, new since [REDACTED] this year,
and near the abnormal superior segment opacity described on Chest
CTA today.
This would seem to favor infection over tumor.

2. Chronic but increased intra- and extrahepatic biliary ductal
dilatation. No filling defect in the CBD. If there is
hyperbilirubinemia then follow-up MRCP and/or ERCP may be valuable.

3. Gas within the urinary bladder, suspicious for UTI unless
explained by recent catheterization.

4. Other chronic findings but no other acute or inflammatory process
in the abdomen or pelvis.

## 2021-10-21 IMAGING — DX DG HIP (WITH OR WITHOUT PELVIS) 2-3V*R*
3 series · 3 of 3 positions shown · non-contrast
Comparison: 09/07/2019

CLINICAL DATA: Right hip pain

EXAM:
DG HIP (WITH OR WITHOUT PELVIS) 2-3V RIGHT

[pelvis ap]
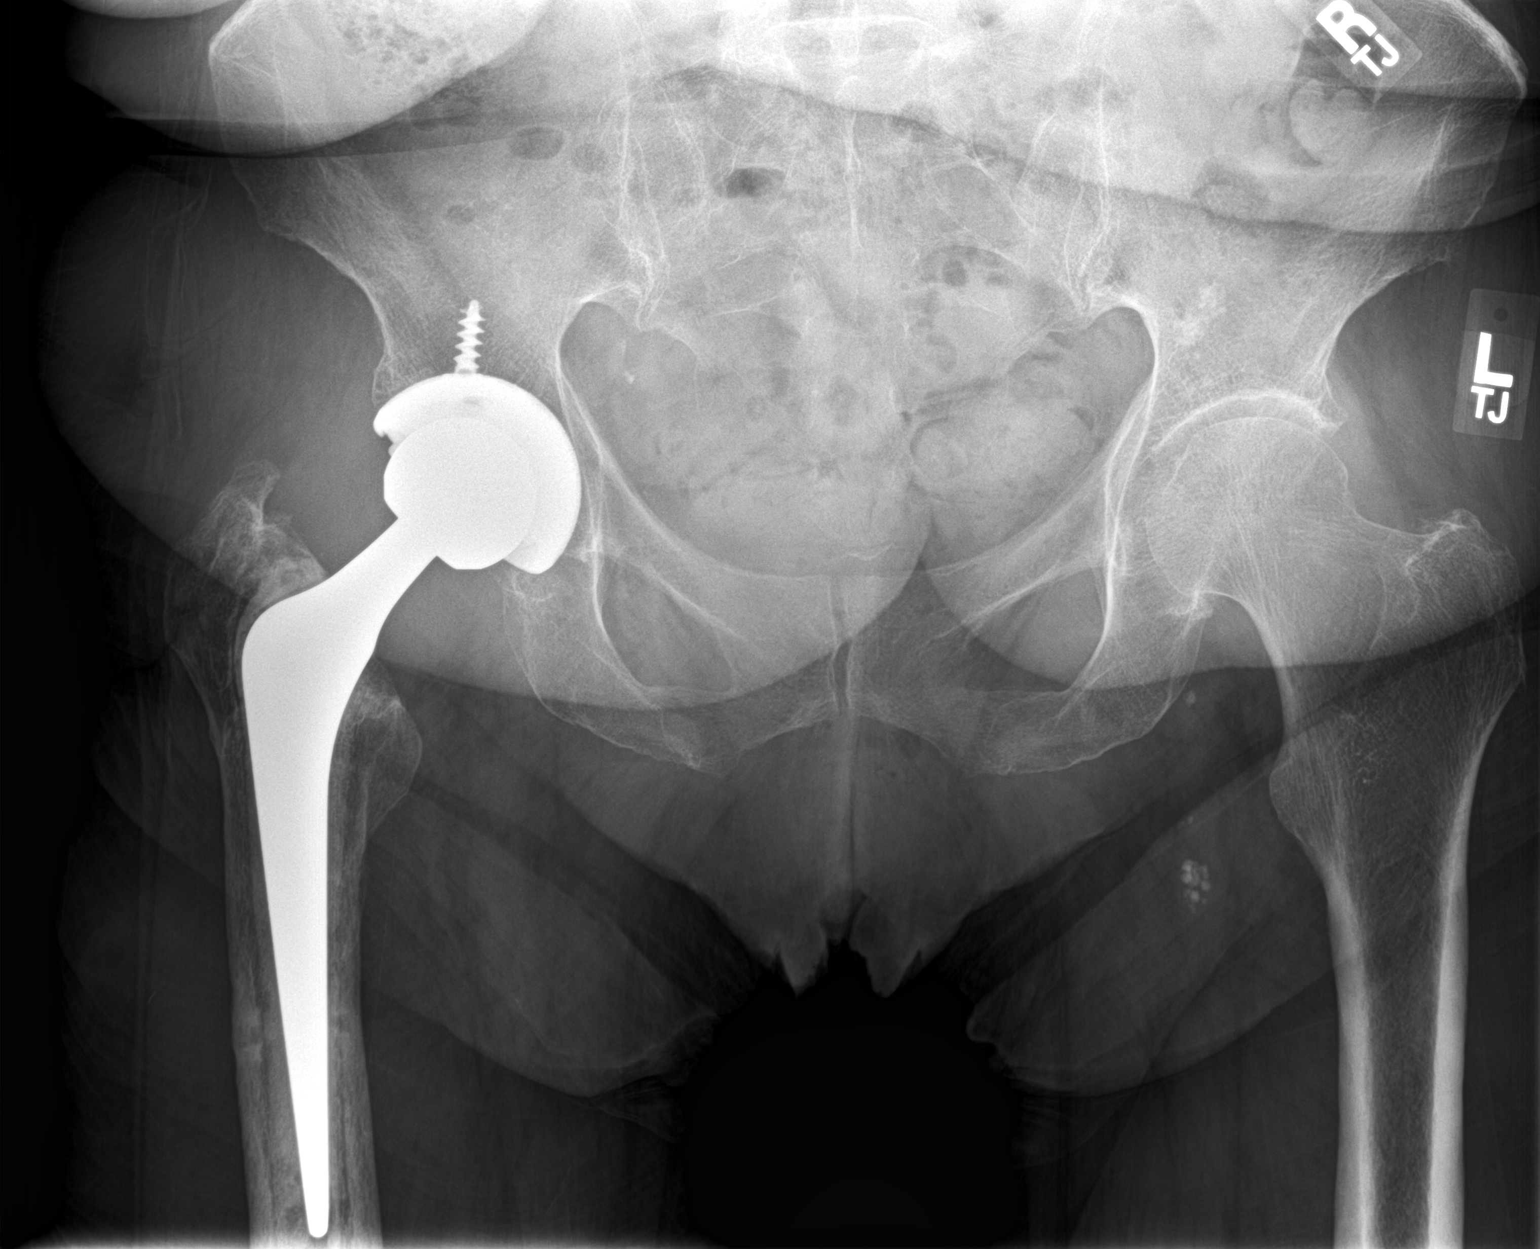

[hip ap]
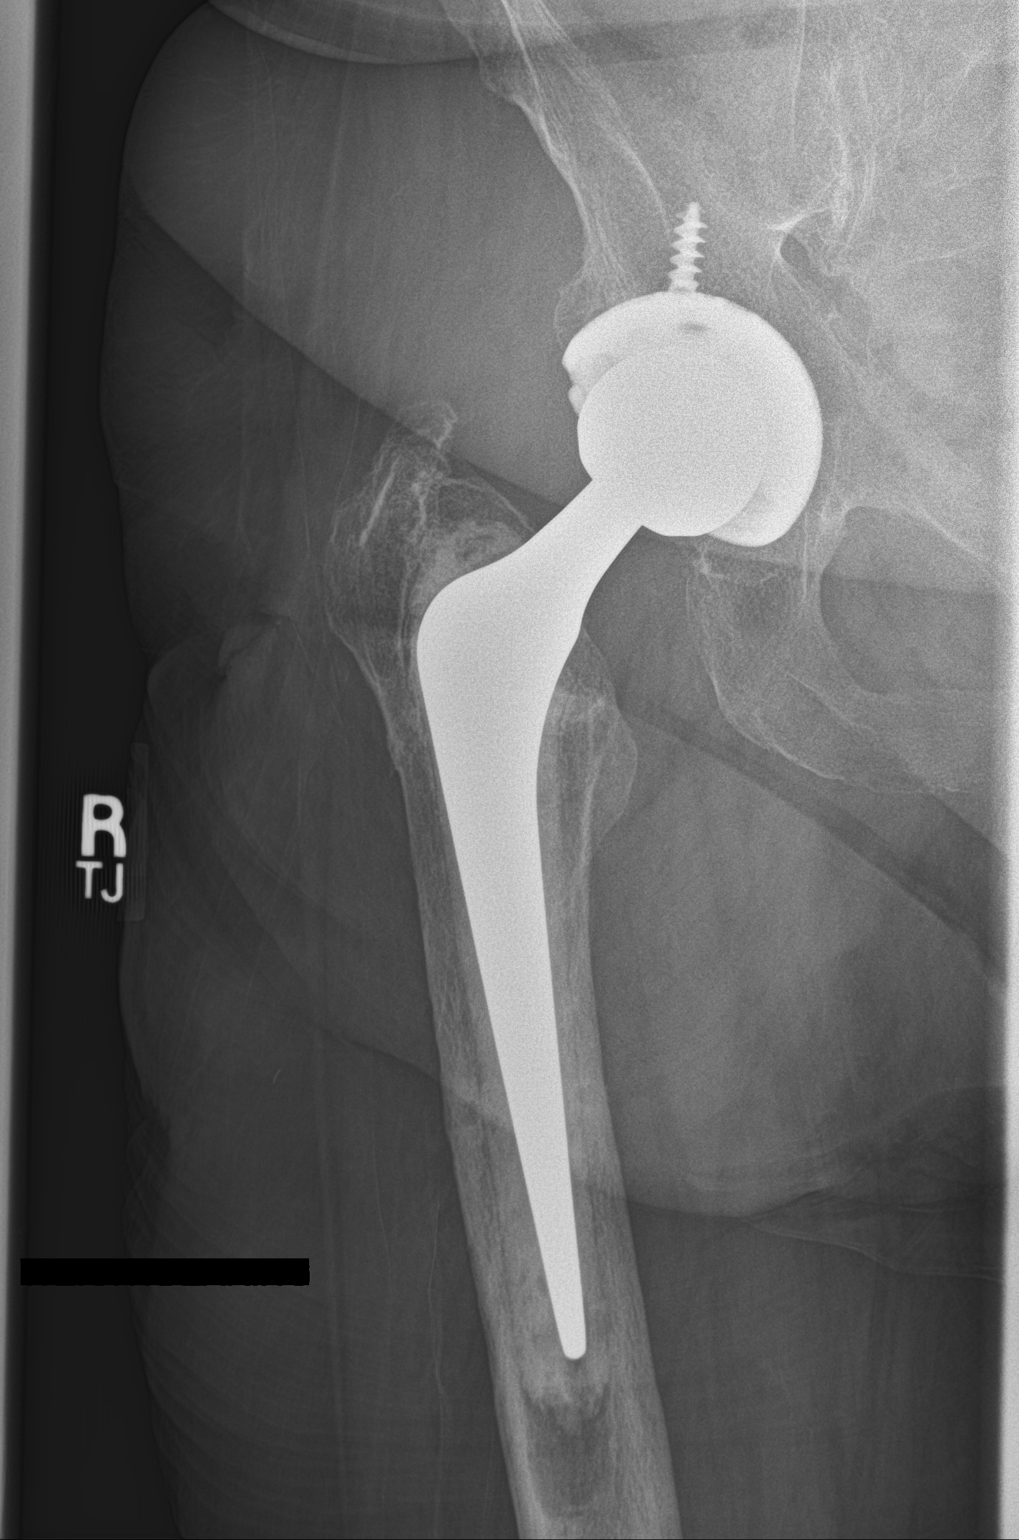

[hip lat]
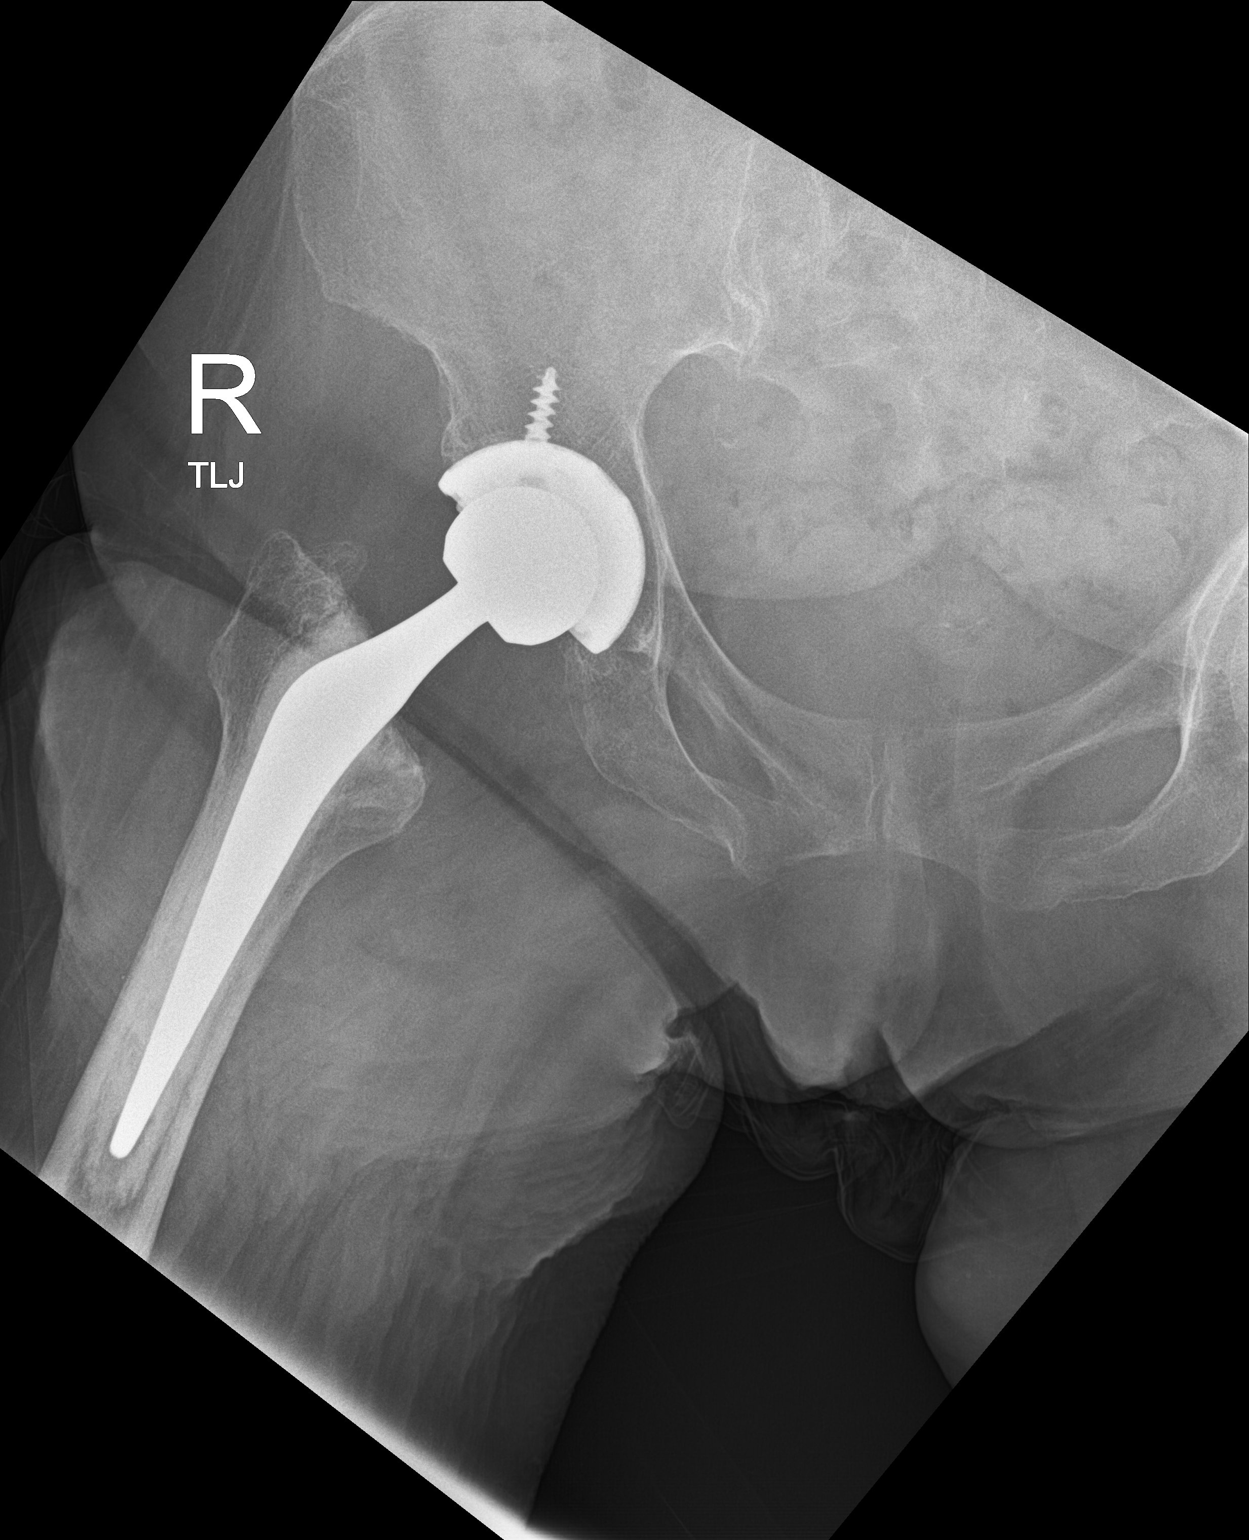

[3 of 3 positions shown; findings below may reference images not displayed]

FINDINGS: Changes of right hip replacement. No hardware complicating feature.
No fracture, subluxation or dislocation
IMPRESSION: No acute bony abnormality.

## 2021-10-29 IMAGING — CT NM PET TUM IMG INITIAL (PI) SKULL BASE T - THIGH
1 of 10 series · 1 of 25 positions shown · non-contrast
Comparison: CTA chest 01/03/2020.  Abdominopelvic CT 01/03/2020.

CLINICAL DATA: Initial treatment strategy for chest CT
demonstrating right lower lobe pneumonia versus mass.

EXAM:
NUCLEAR MEDICINE PET SKULL BASE TO THIGH
TECHNIQUE: 8.9 mCi F-18 FDG was injected intravenously. Full-ring PET imaging
was performed from the skull base to thigh after the radiotracer. CT
data was obtained and used for attenuation correction and anatomic
localization.
Fasting blood glucose: 89 mg/dl

[Series 4: ct wb 5.0 b30f · axial · 5.0mm · 0.98mm/px · 1 of 290 slices shown]
[im 290/290  brain]
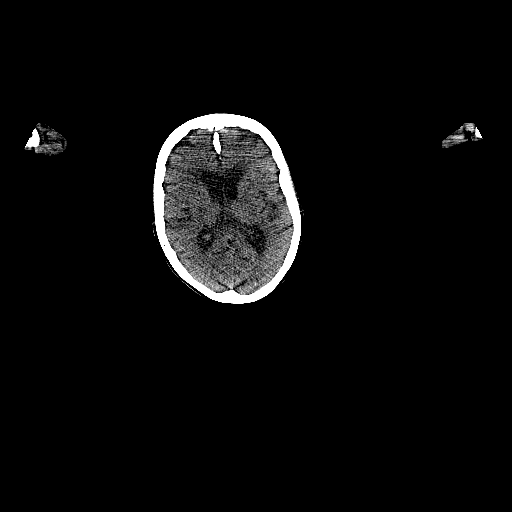

[1 of 25 positions shown; findings below may reference images not displayed]

FINDINGS: Mediastinal blood pool activity: SUV max

Liver activity: SUV max NA

NECK: No areas of abnormal hypermetabolism.

Incidental CT findings: No cervical adenopathy. Bilateral carotid
atherosclerosis.

CHEST: Pleural-based right lower lobe partially cavitary lung mass
persists. 3.3 cm and a S.U.V. max of 18.5, including on 93/4.

Hypermetabolic adenopathy within the azygoesophageal recess. 1.4 cm
and a S.U.V. max of 16.2 on 85/4.

A low right paratracheal node measures 7 mm and a S.U.V. max of
on 81/4, equivocal.

Incidental CT findings: Similar trace right pleural fluid or
thickening. Cardiomegaly. Aortic and coronary artery
atherosclerosis. Interstitial lung disease, suboptimally evaluated.
Concurrent centrilobular emphysema.

ABDOMEN/PELVIS: No abdominopelvic parenchymal or nodal
hypermetabolism.

Incidental CT findings: Deferred to recent diagnostic CT. Right
renal artery aneurysm of 8 mm on 145/4. Abdominal aortic
atherosclerosis. No acute superimposed process. Pelvic degradation
secondary to right hip arthroplasty.

SKELETON: No suspicious marrow hypermetabolism. Superficial to the
proximal right femur is subcutaneous soft tissue thickening and mild
hypermetabolism, including at a S.U.V. max of 3.4 on 256/4. Likely
postoperative.

Incidental CT findings: Osteopenia. Upper thoracic vertebral body
height loss was present on 01/03/2020.
IMPRESSION: 1. Persistent right lower lobe lung mass, consistent with primary
bronchogenic carcinoma.
2. Hypermetabolic node within the azygoesophageal recess, consistent
with nodal metastasis. A right paratracheal node is equivocal.
3. No evidence of hypermetabolic extrathoracic metastatic disease.

## 2021-11-04 IMAGING — CT CT T SPINE W/O CM
2 series · 9 of 29 positions shown, 11 images · IV contrast (APPLIED)
Comparison: None.

CLINICAL DATA: Lung cancer, right chest pain

EXAM:
CT THORACIC SPINE WITHOUT CONTRAST
TECHNIQUE: Multidetector CT images of the thoracic were obtained using the
standard protocol without intravenous contrast.

[Series 10: t spine st · axial · 0.31mm/px · z∈[-859,-639]mm · 4 of 160 slices shown, 5 images]
[im 25/160  soft-tissue]
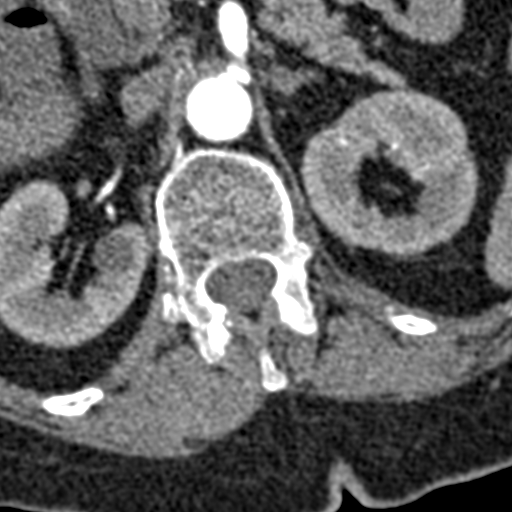
[im 25/160  bone]
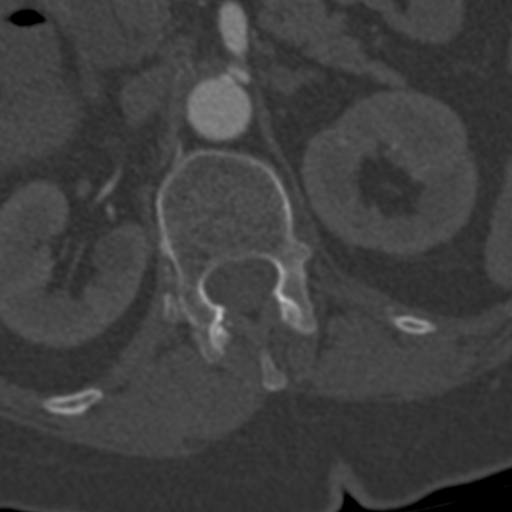
[im 62/160  bone]
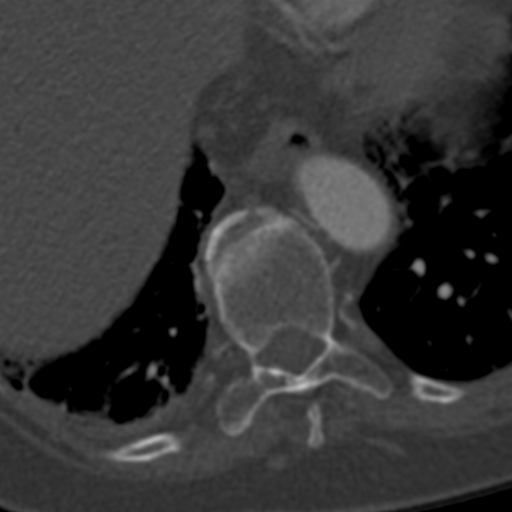
[im 98/160  bone]
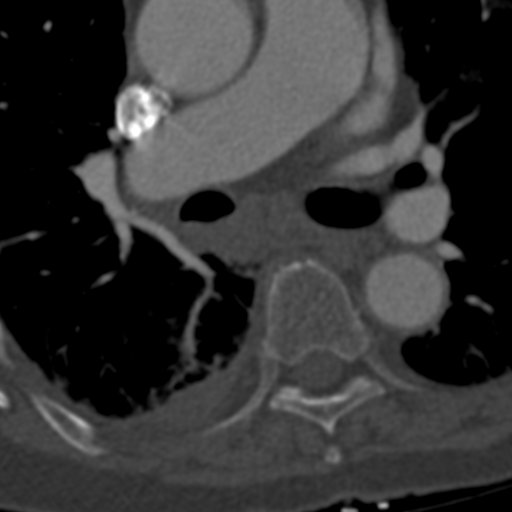
[im 135/160  bone]
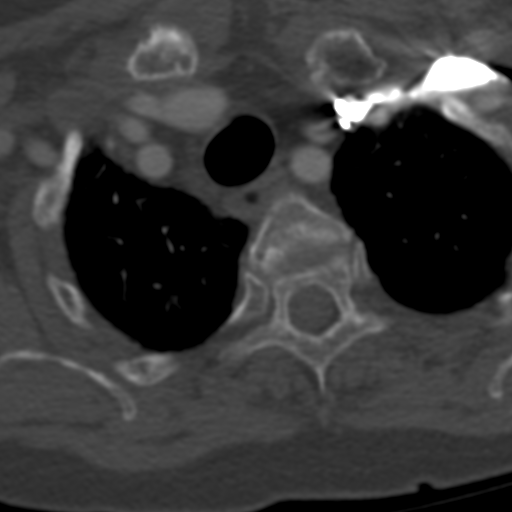

[Series 12: sag t spine bone · sagittal · 0.31mm/px · 5 of 84 slices shown, 6 images]
[im 28/84  bone]
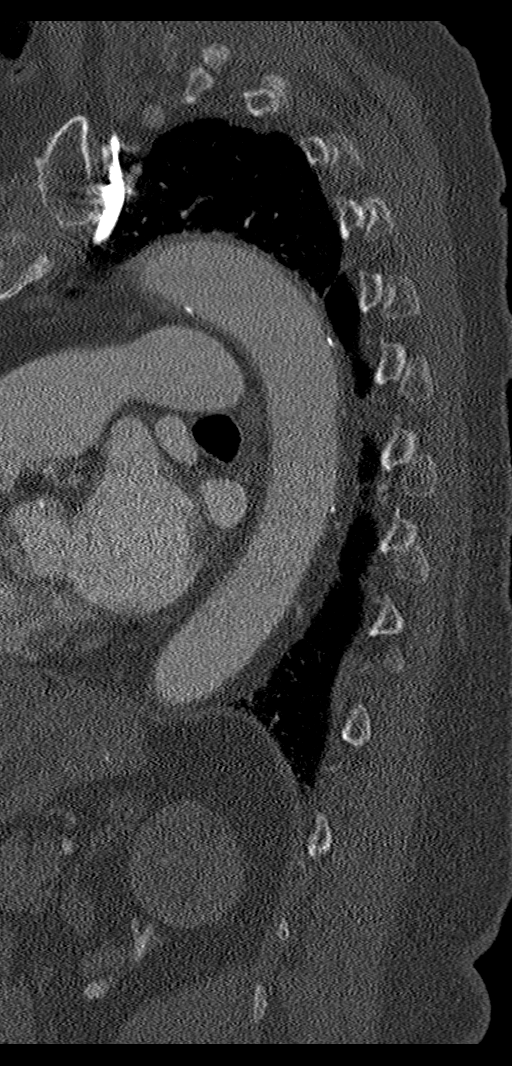
[im 35/84  bone]
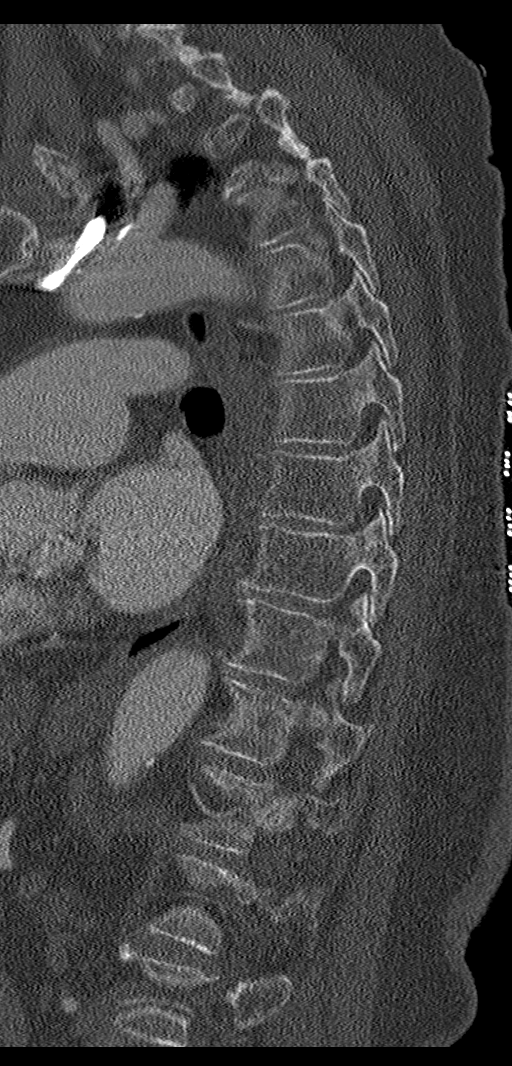
[im 42/84  soft-tissue]
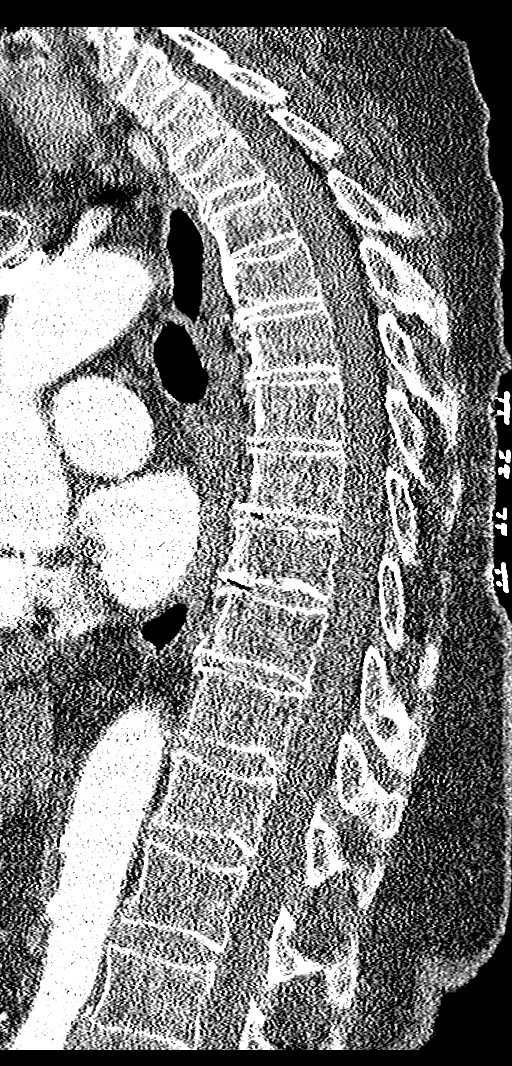
[im 42/84  bone]
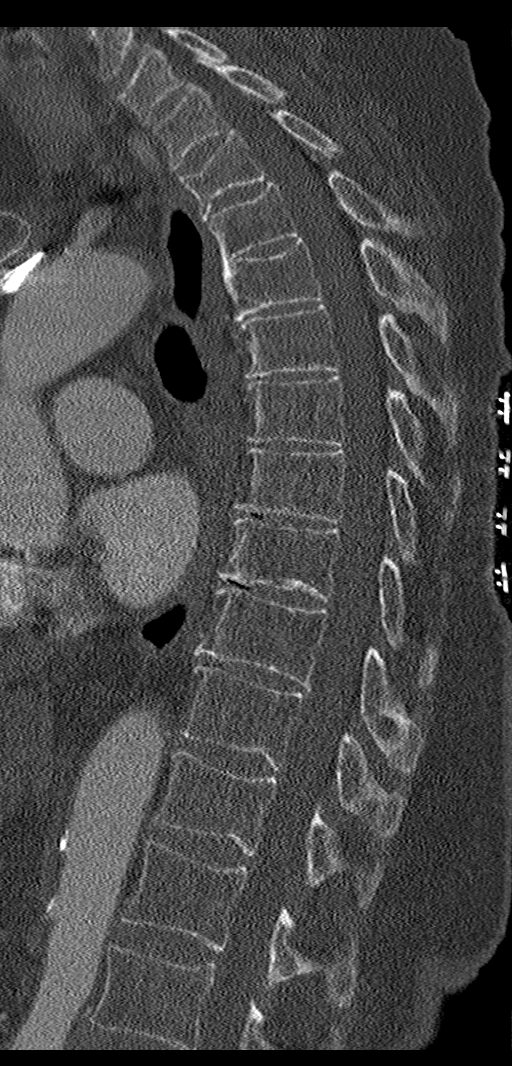
[im 49/84  bone]
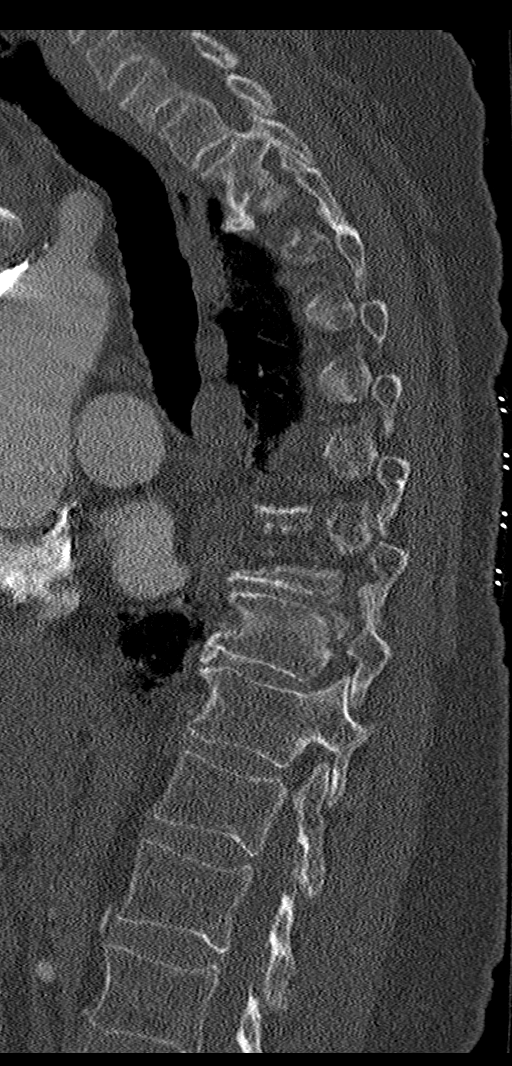
[im 56/84  bone]
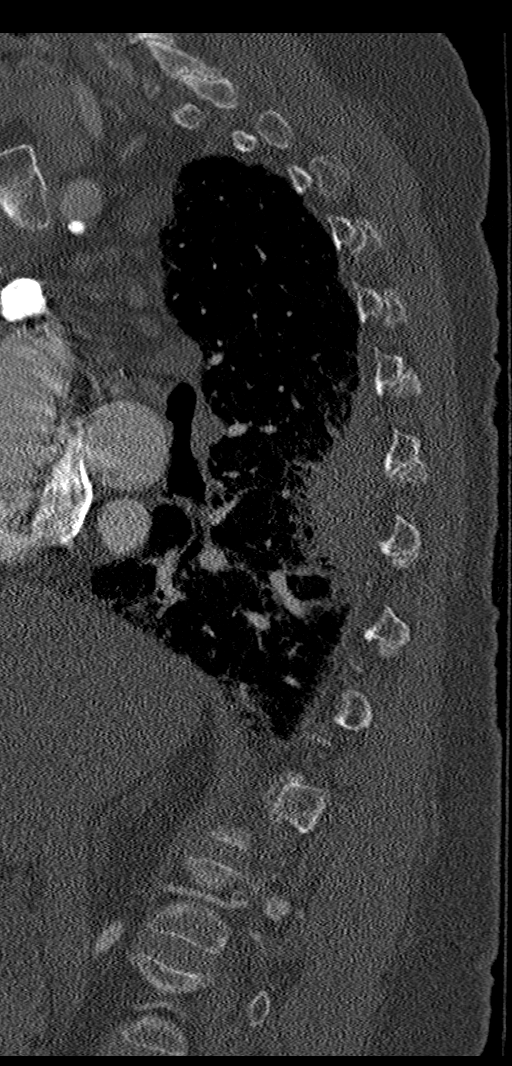

[9 of 29 positions shown; findings below may reference images not displayed]

Findings are correlated with PET CT examination
of 01/25/2020 and concurrently performed CT arteriogram of the chest
FINDINGS: Alignment: Normal thoracic kyphosis.  No listhesis.

Vertebrae: Mild anterior wedge compression fracture T1 and T3 are
identified with approximately 40% and 20% loss of height,
respectively. No acute fracture of the thoracic spine. There is no
lytic or blastic bone lesion identified involving the vertebral
bodies of the thoracic spine. There is, however, subtle erosion of
the a right eighth rib just lateral to the a costovertebral
junction, best seen on axial image # 67/11 and 67/10 in keeping with
chest wall invasion and direct involvement of the a thoracic cage by
the primary mass within the posterior basal right lower lobe.

Paraspinal and other soft tissues: Posterior basal right lower lobe
pleural based central necrotic pulmonary mass again identified. Mild
subpleural pulmonary fibrosis again noted. Pathologic as ago
esophageal recess noted. Multiple simple cortical cysts are seen
within the visualized left kidney.

Disc levels: Intervertebral disc height has been preserved though
there are discogenic changes noted predominantly at T5-T11 in
keeping with changes of mild to moderate degenerative disc disease.
Review of the axial images demonstrates no significant canal
stenosis. No significant neural foraminal narrowing.
IMPRESSION: 1. No lytic or blastic bone lesion identified involving the
vertebral bodies of the thoracic spine.
2. Mild anterior wedge compression fractures of T1 and T3 are
identified with approximately 40% and 20% loss of height,
respectively. These appear chronic in nature.
3. No significant canal stenosis or neural foraminal narrowing is
seen within the thoracic spine.
4. Posterior basal right lower lobe pleural based central necrotic
pulmonary mass again identified. There is subtle erosion of the
right eighth rib just lateral to the a costovertebral junction in
keeping with chest wall invasion and direct involvement of the a
thoracic cage by the primary mass within the posterior basal right
lower lobe.

## 2021-11-04 IMAGING — CR DG CHEST 2V
1 series · 2 of 2 positions shown · non-contrast
Comparison: Chest CT January 03, 2020

CLINICAL DATA: Right-sided chest pain.

EXAM:
CHEST - 2 VIEW

[Series 1: dg chest 2 view · 0.14mm/px · 2 of 2 slices shown]
[im 1/2]
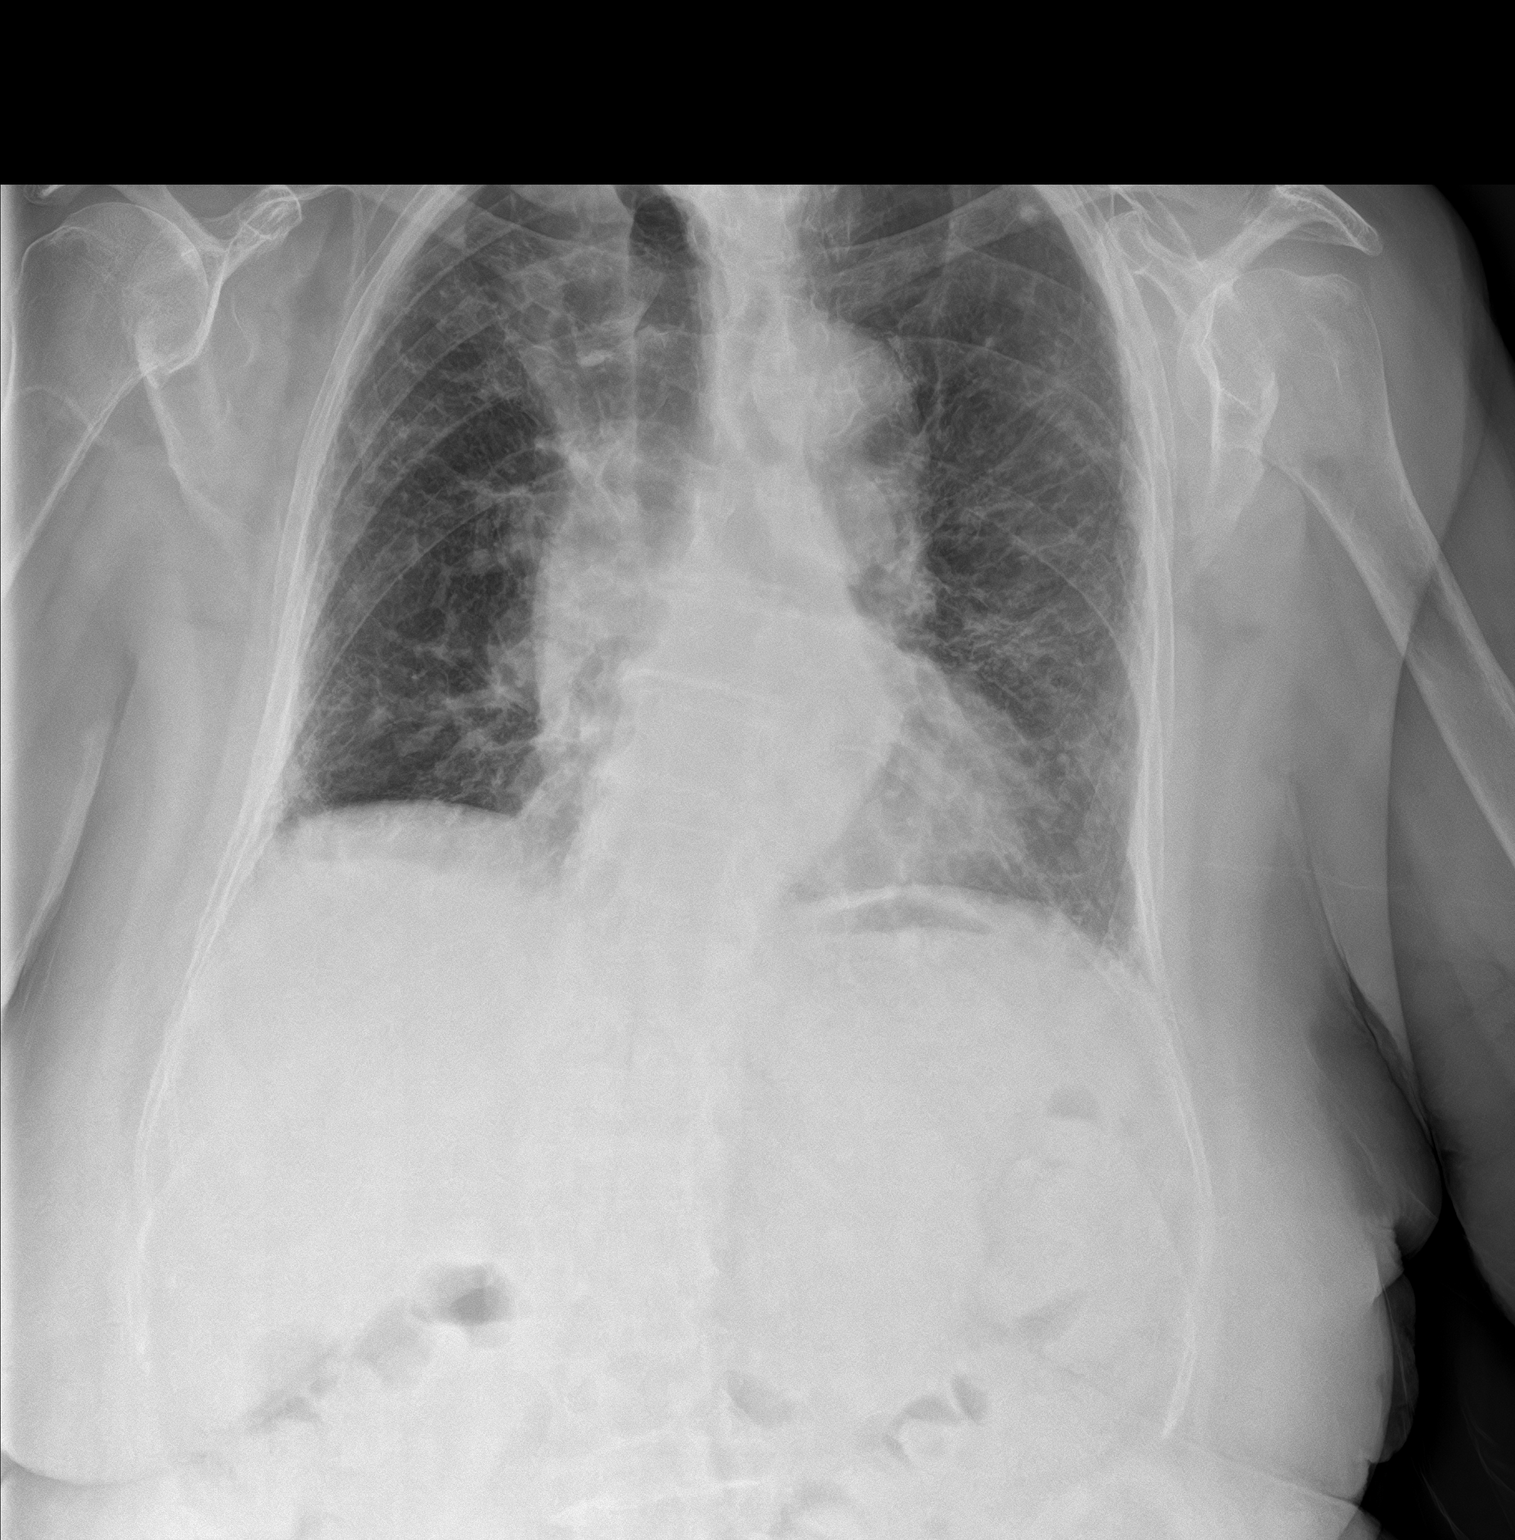
[im 2/2]
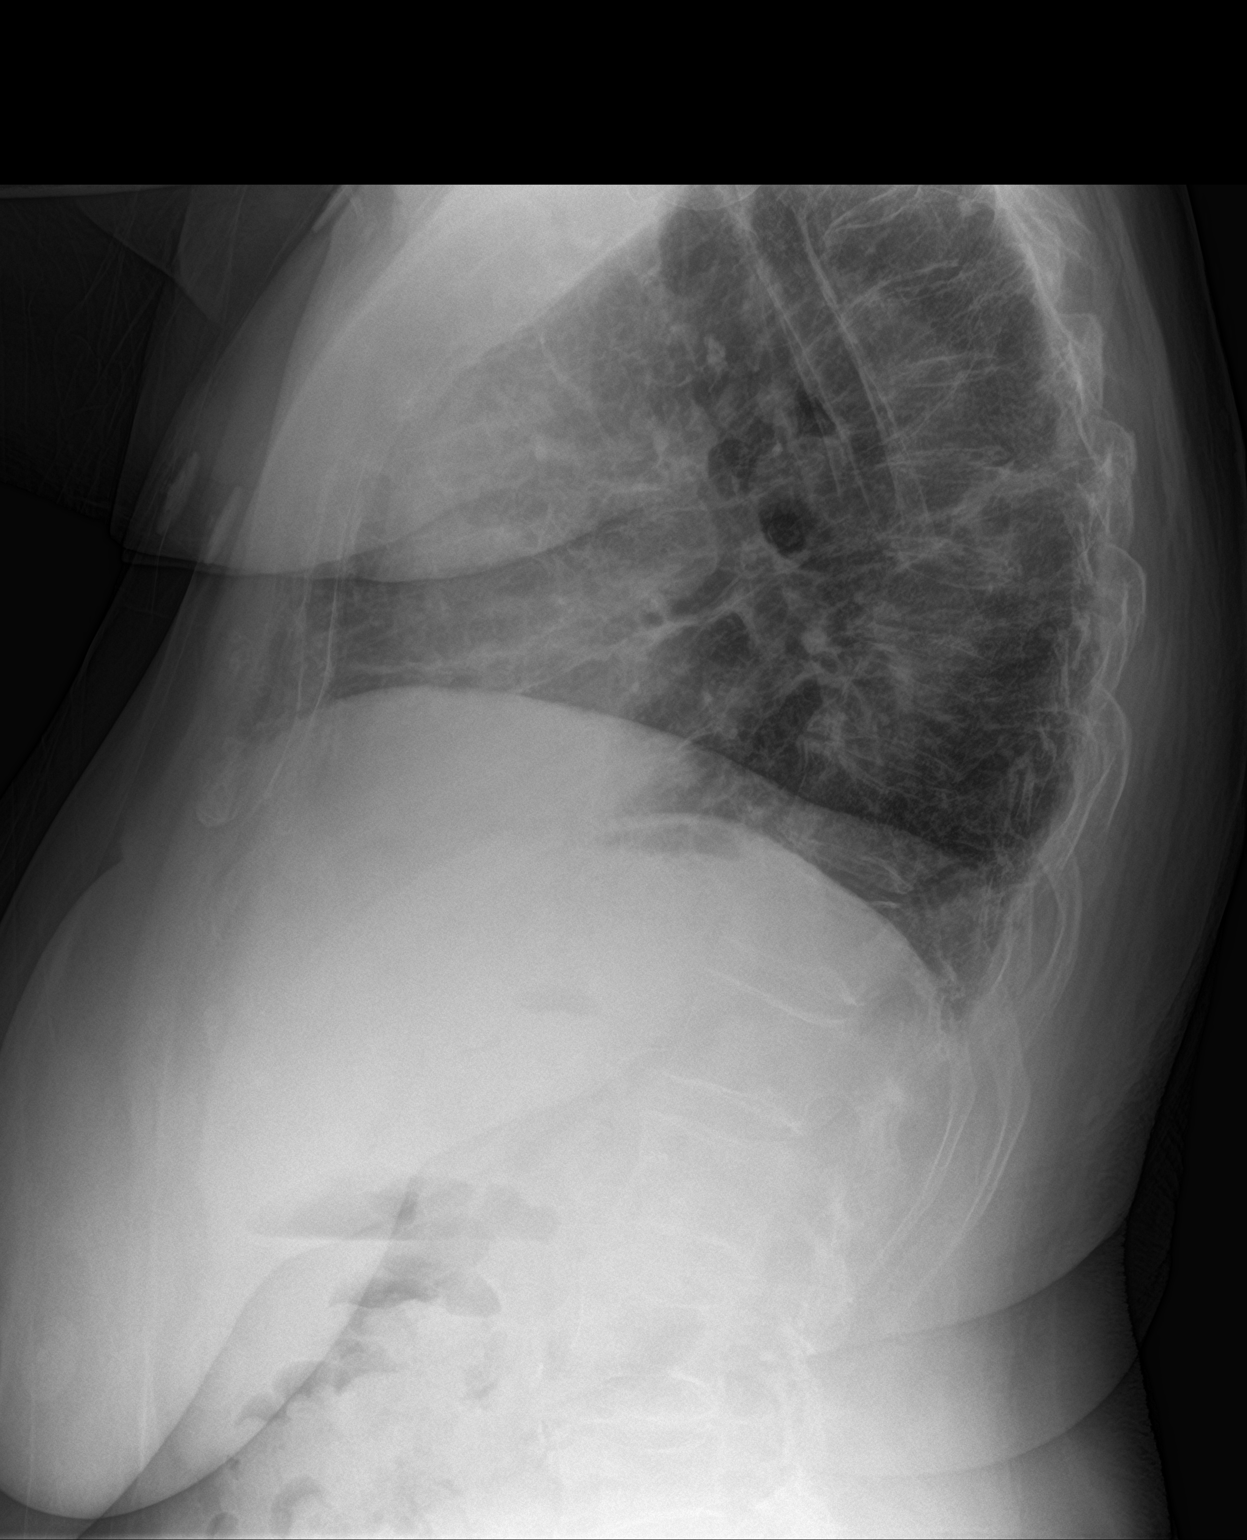

[2 of 2 positions shown; findings below may reference images not displayed]

FINDINGS: Cardiomediastinal silhouette is normal. Mediastinal contours appear
intact. Tortuosity of the aorta.

Right lower lobe lung mass is not changed radiographically.
Coarsening of the interstitium.

Osseous structures are without acute abnormality. Soft tissues are
grossly normal.
IMPRESSION: 1. Right lower lobe lung mass is not changed radiographically.
2. Coarsening of the interstitium.

## 2021-11-07 IMAGING — DX DG CHEST 1V
1 series · 1 of 1 positions shown · non-contrast
Comparison: Radiograph and CT 3 days ago 01/31/2020

CLINICAL DATA: Right-sided chest pain. Recent lung cancer
diagnosis.

EXAM:
CHEST  1 VIEW

[chest ap]
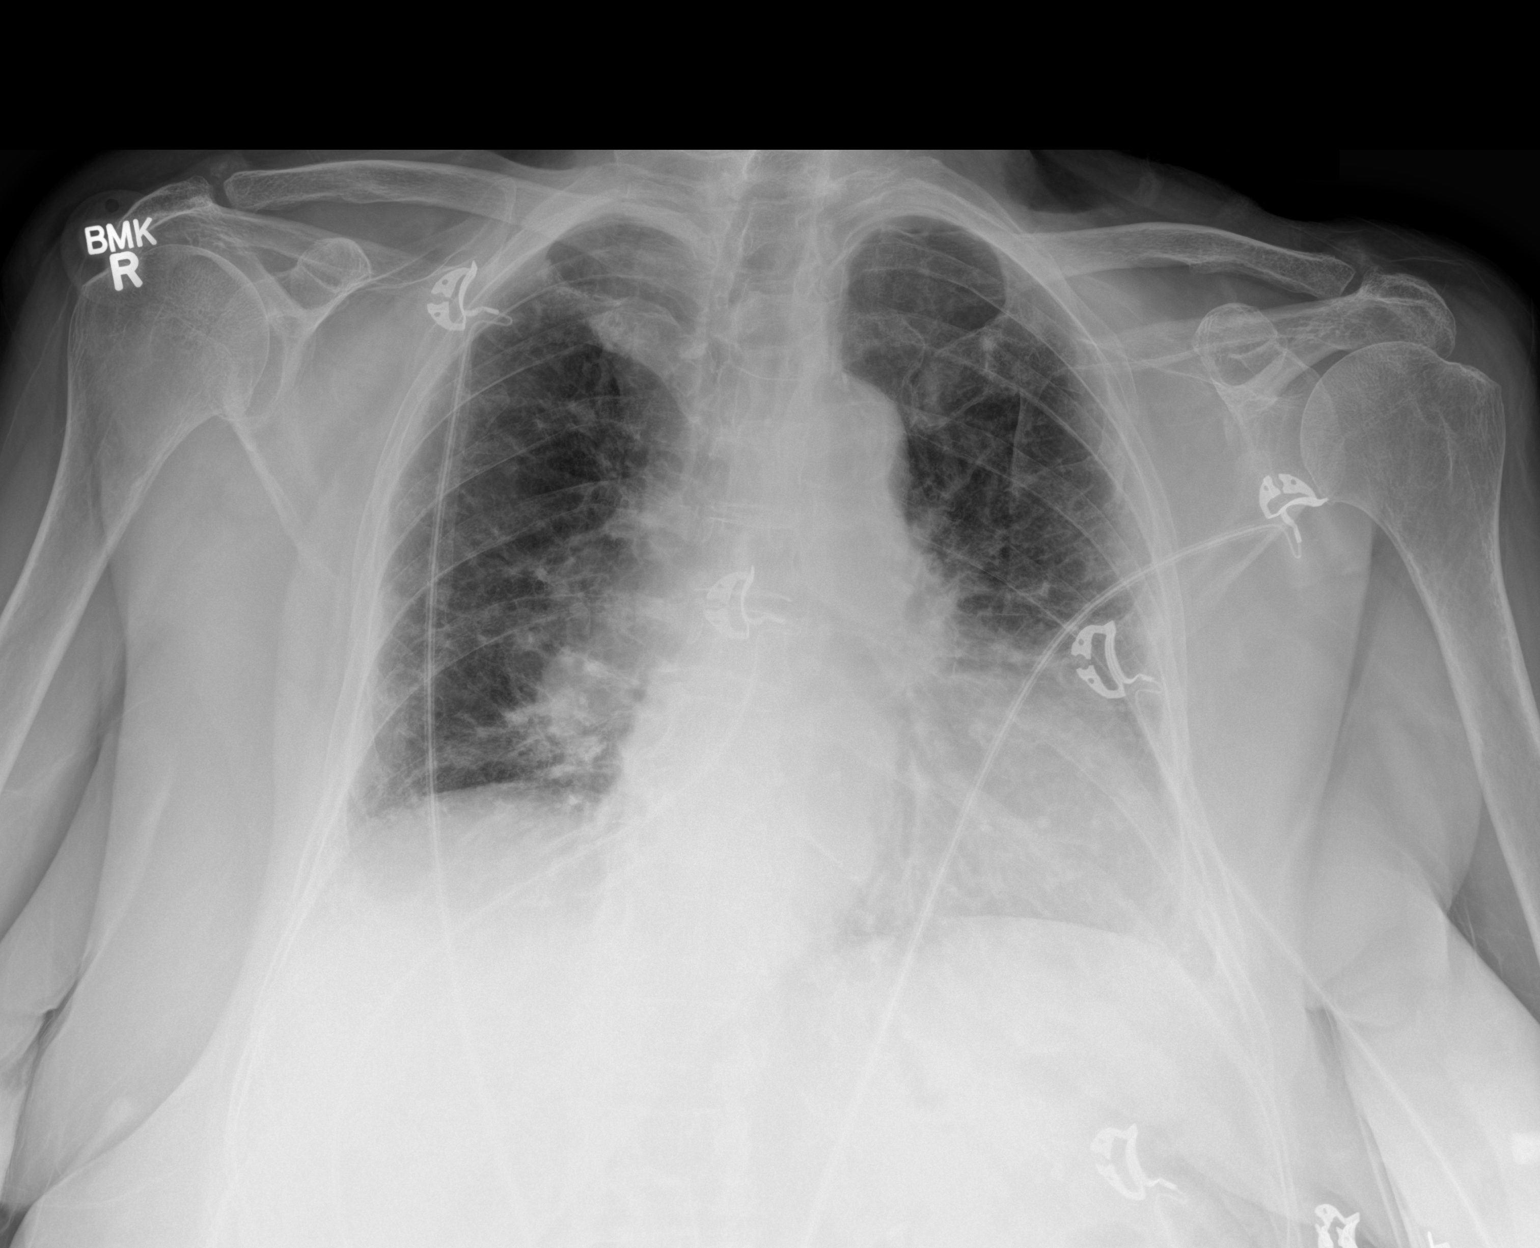

[1 of 1 positions shown; findings below may reference images not displayed]

FINDINGS: Stable heart size and mediastinal contours. Rounded density
projecting over the medial right lung base corresponds to lung mass
on CT. No acute airspace disease. No developing pleural effusion. No
pneumothorax. Chronic interstitial coarsening. Stable osseous
structures.
IMPRESSION: 1. Stable radiographic appearance of the chest.
2. Rounded density projecting over the medial right lung base
corresponds to lung mass on CT.

## 2021-11-25 IMAGING — CT CT BIOPSY
1 of 3 series · 9 of 32 positions shown, 15 images · non-contrast
Comparison: none

CLINICAL DATA: Persistent posterior right lower lobe lung mass
demonstrating increased metabolic activity by PET scan. The patient
presents for CT-guided biopsy of the lung mass.

[Series 2: i-spiral 5.0 b30f · axial · 0.71mm/px · z∈[-115,-48]mm · 9 of 25 slices shown, 15 images]
[im 3/25  soft-tissue]
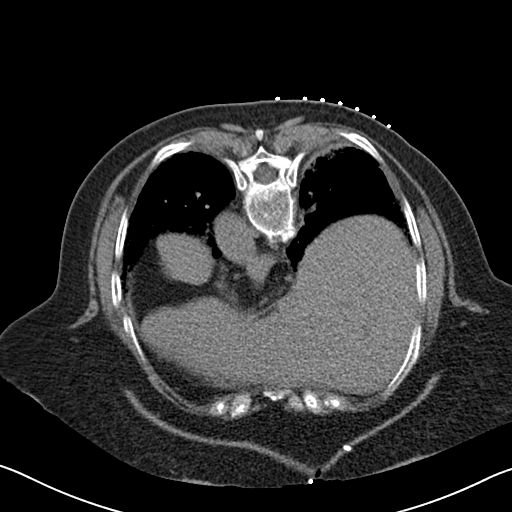
[im 3/25  bone]
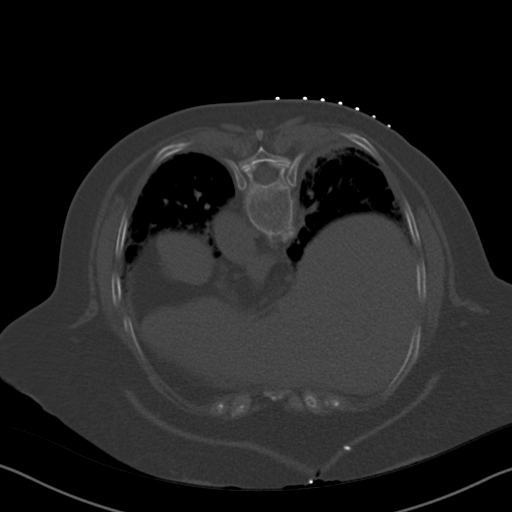
[im 5/25  soft-tissue]
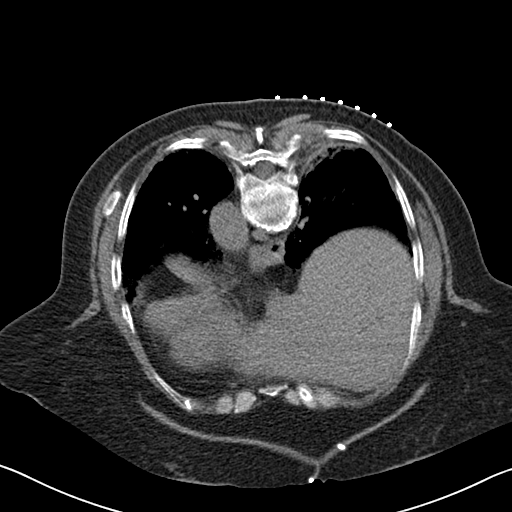
[im 8/25  soft-tissue]
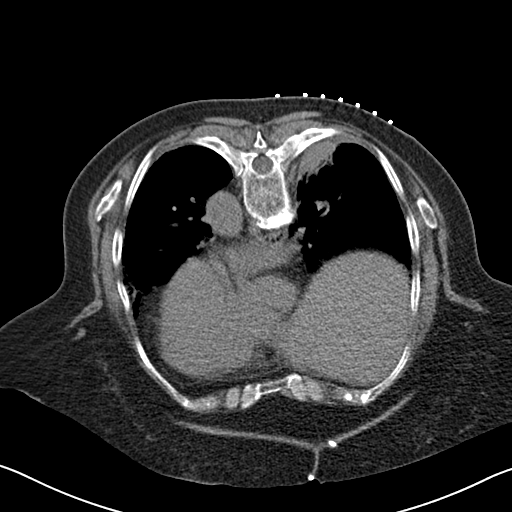
[im 10/25  soft-tissue]
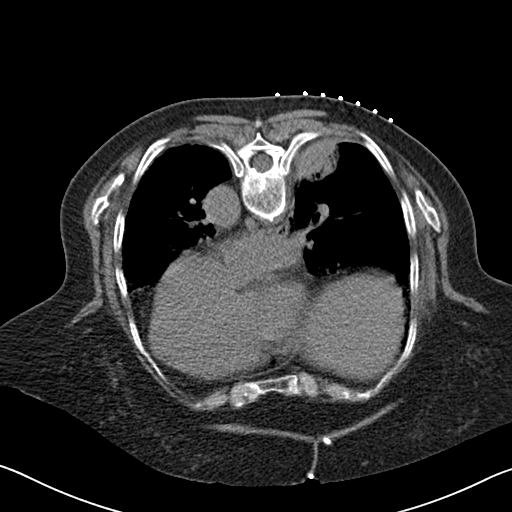
[im 13/25  soft-tissue]
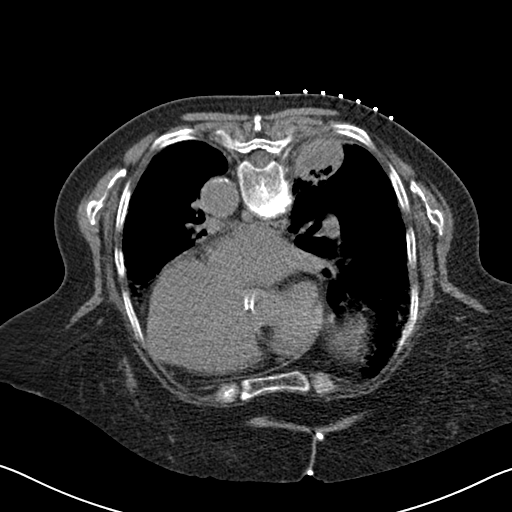
[im 15/25  soft-tissue]
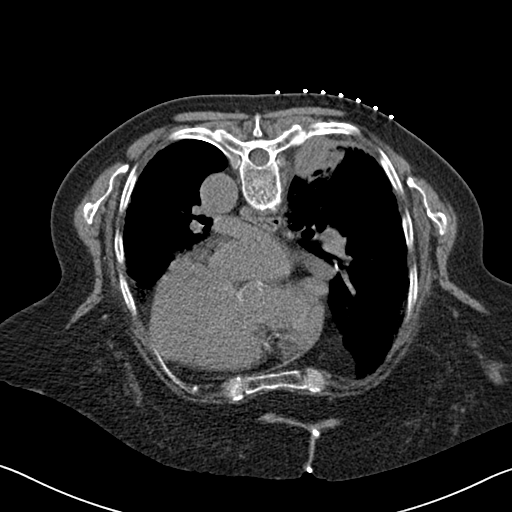
[im 15/25  lung]
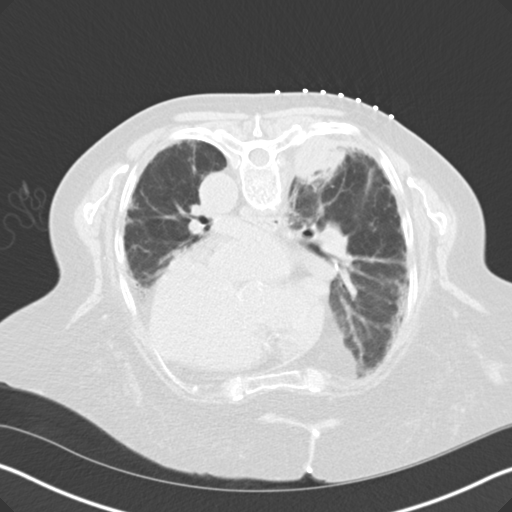
[im 17/25  soft-tissue]
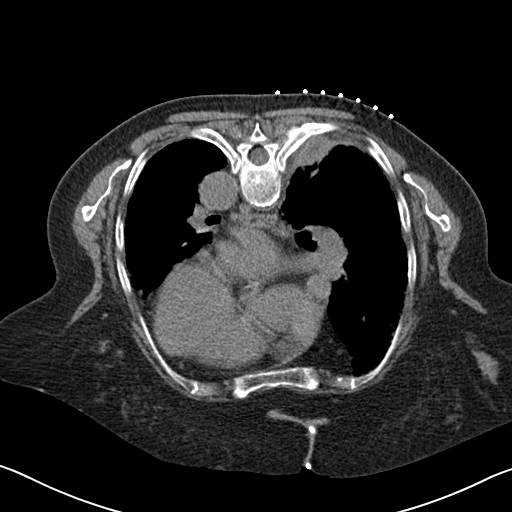
[im 17/25  lung]
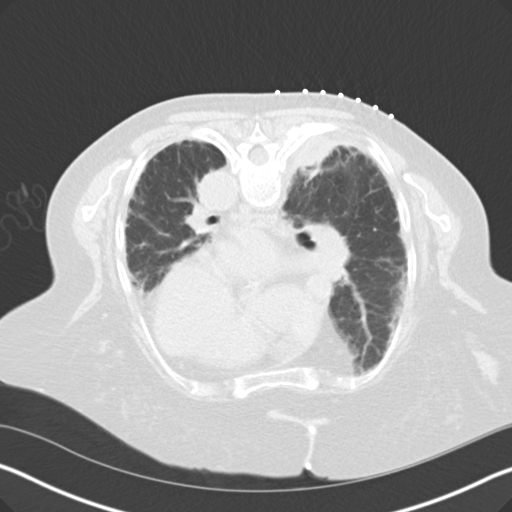
[im 20/25  soft-tissue]
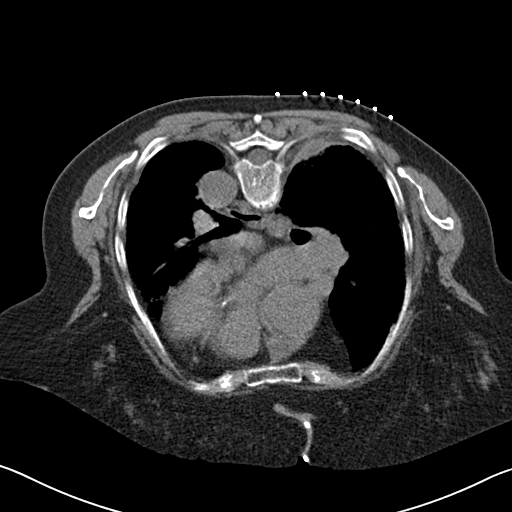
[im 20/25  lung]
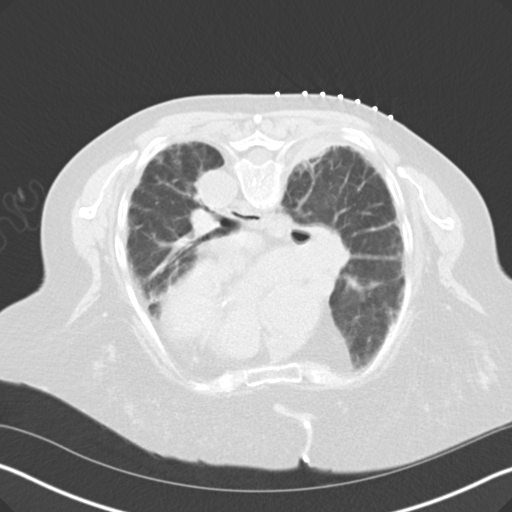
[im 22/25  soft-tissue]
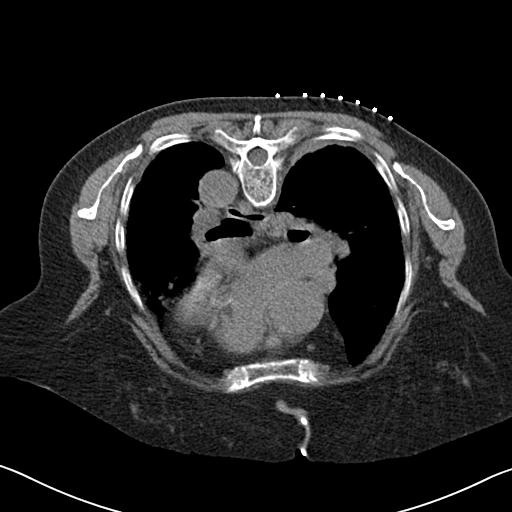
[im 22/25  lung]
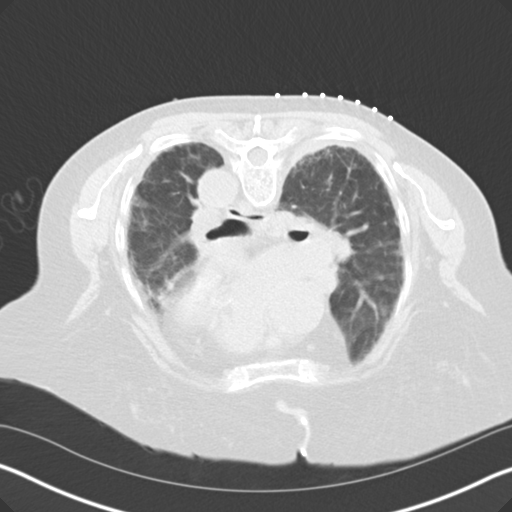
[im 22/25  bone]
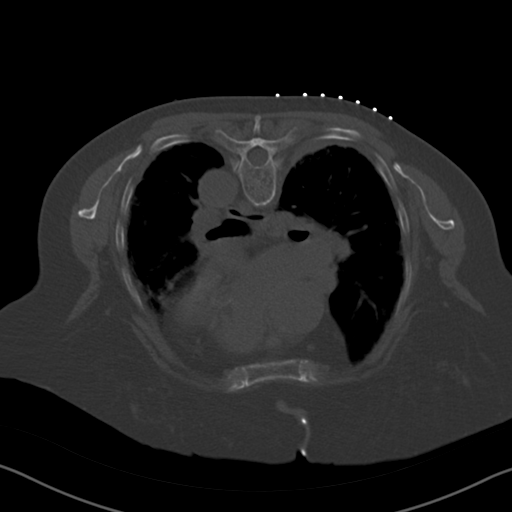

[9 of 32 positions shown; findings below may reference images not displayed]

EXAM:
CT GUIDED CORE BIOPSY OF RIGHT LOWER LOBE LUNG MASS

ANESTHESIA/SEDATION:
2.0 mg IV Versed; 100 mcg IV Fentanyl

Total Moderate Sedation Time:  26 minutes.

The patient's level of consciousness and physiologic status were
continuously monitored during the procedure by Radiology nursing.

PROCEDURE:
The procedure risks, benefits, and alternatives were explained to
the patient. Questions regarding the procedure were encouraged and
answered. The patient understands and consents to the procedure. A
time-out was performed prior to initiating the procedure.

CT was performed through the chest in a prone position. The right
posterior chest wall was prepped with chlorhexidine in a sterile
fashion, and a sterile drape was applied covering the operative
field. A sterile gown and sterile gloves were used for the
procedure. Local anesthesia was provided with 1% Lidocaine.

Under CT guidance, a 17 gauge trocar needle was advanced to the
level of a posterior right lower lobe lung mass. After confirming
needle tip position, 2 separate coaxial 18 gauge core biopsy samples
were obtained and submitted in formalin. The BioSentry device was
used in depositing a plug at the pleural entry site. Additional CT
was performed after outer needle removal.

COMPLICATIONS:
None
FINDINGS: Posterior subpleural right lower lobe lung mass again noted
measuring approximately 3.0 x 3.9 cm in greatest transverse
dimensions. Solid tissue was obtained. There was no evidence of
regional hemorrhage or pneumothorax immediately following the
procedure by CT. The patient will recover for at least 2 hours after
the procedure.
IMPRESSION: CT-guided core biopsy performed of a 3.9 cm right lower lobe
subpleural mass.

## 2021-12-07 IMAGING — MR MR HEAD WO/W CM
14 series · 48 of 48 positions shown · IV contrast (gadavist)
Comparison: Head CT 08/28/2019.  Brain MRI 06/11/2013.

CLINICAL DATA: New diagnosis squamous cell carcinoma of the lung.
Staging.

EXAM:
MRI HEAD WITHOUT AND WITH CONTRAST
TECHNIQUE: Multiplanar, multiecho pulse sequences of the brain and surrounding
structures were obtained without and with intravenous contrast.
CONTRAST:  7.5mL GADAVIST GADOBUTROL 1 MMOL/ML IV SOLN

[Series 5: ax dwi_tracew · axial · 3.0mm · 0.71mm/px · z∈[-43,+97]mm · 2 of 50 slices shown]
[im 1/50]
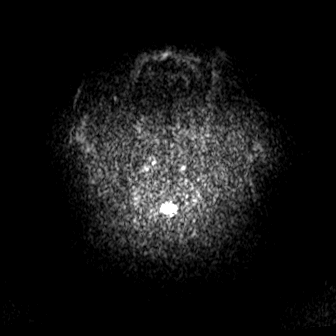
[im 50/50]
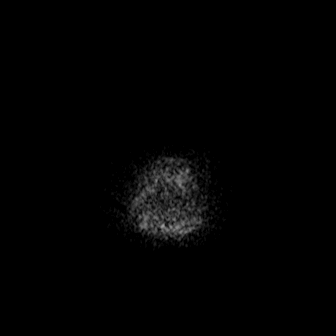

[Series 6: ax dwi_adc · axial · 3.0mm · 0.71mm/px · z∈[-43,+97]mm · 3 of 50 slices shown]
[im 1/50]
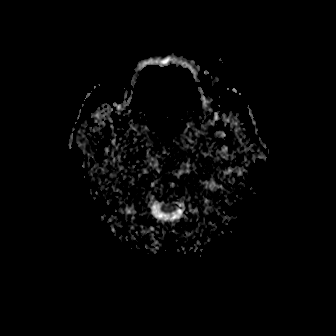
[im 25/50]
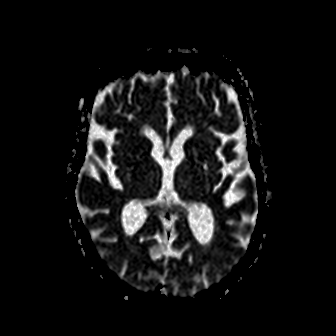
[im 50/50]
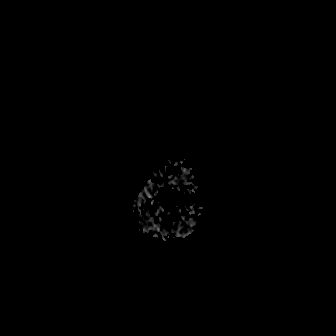

[Series 7: cor dwi_tracew · coronal · 5.0mm · 0.68mm/px · 2 of 38 slices shown]
[im 1/38]
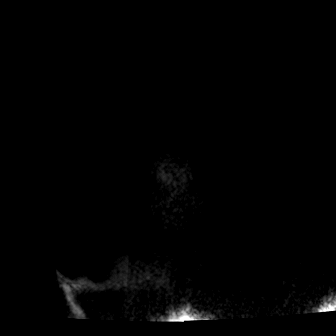
[im 38/38]
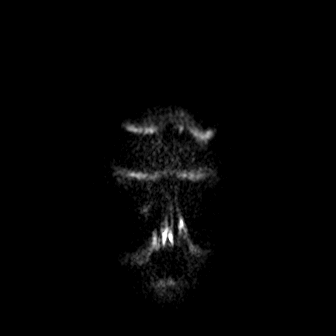

[Series 8: cor dwi_adc · coronal · 5.0mm · 0.68mm/px · 2 of 38 slices shown]
[im 1/38]
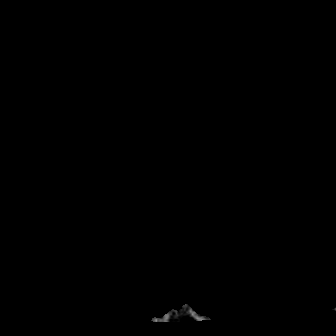
[im 38/38]
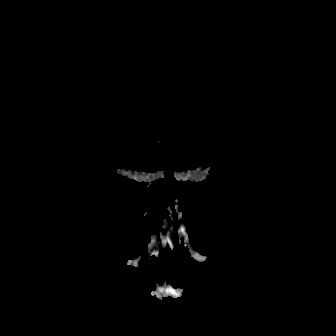

[Series 9: T1 · sagittal · 5.0mm · 0.94mm/px · 1 of 21 slices shown (1 of 2)]
[im 1/21]
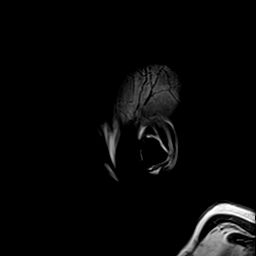

[Series 10: T2 · axial · 5.0mm · 0.69mm/px · z∈[-46,+91]mm · 2 of 25 slices shown (1 of 2)]
[im 1/25]
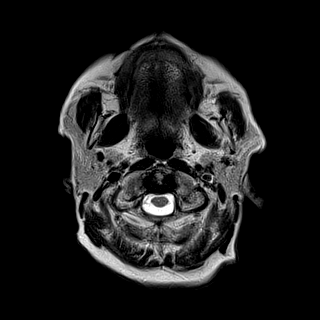
[im 25/25]
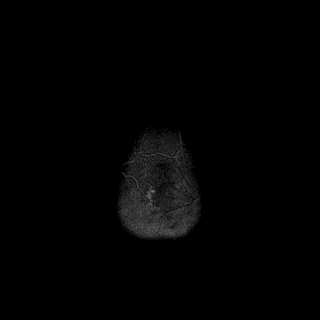

[Series 12: pha_images · axial · 3.0mm · 0.90mm/px · z∈[-42,+92]mm · 3 of 48 slices shown]
[im 1/48]
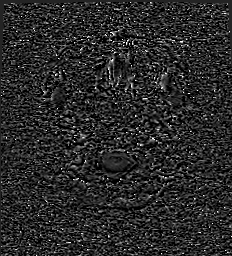
[im 24/48]
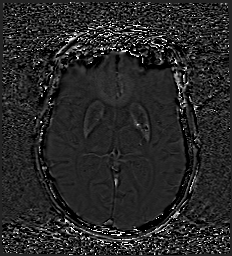
[im 48/48]
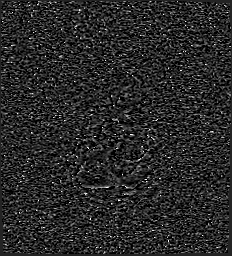

[Series 13: swi_images · axial · 3.0mm · 0.90mm/px · z∈[-42,+92]mm · 3 of 48 slices shown]
[im 1/48]
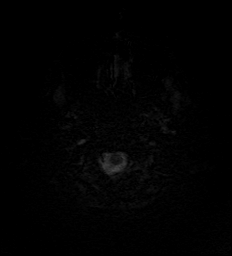
[im 24/48]
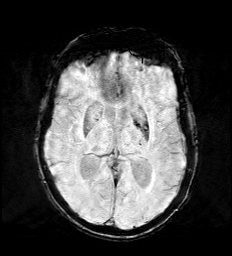
[im 48/48]
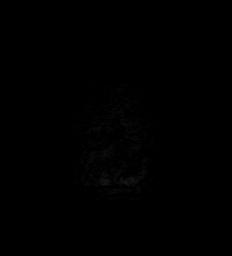

[Series 15: FLAIR · axial · 3.0mm · 0.69mm/px · z∈[-46,+91]mm · 3 of 49 slices shown]
[im 1/49]
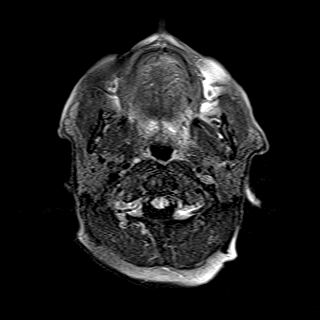
[im 25/49]
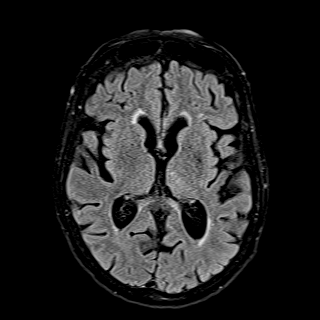
[im 49/49]
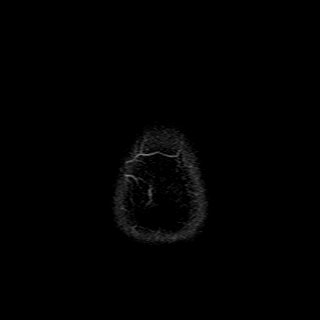

[Series 16: T1 · axial · 1.0mm · 0.98mm/px · z∈[-57,+110]mm · 11 of 176 slices shown (2 of 2)]
[im 1/176]
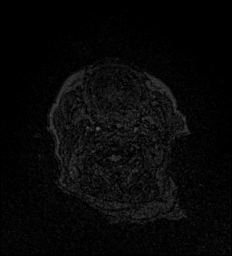
[im 18/176]
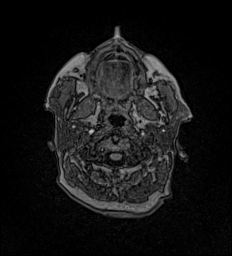
[im 36/176]
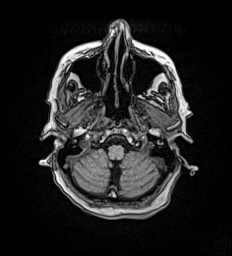
[im 53/176]
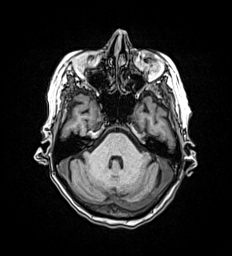
[im 71/176]
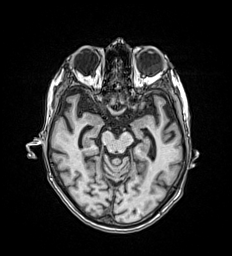
[im 88/176]
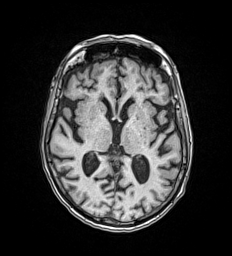
[im 106/176]
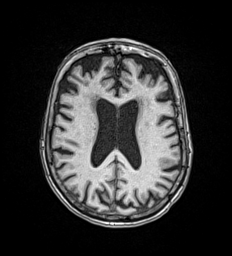
[im 123/176]
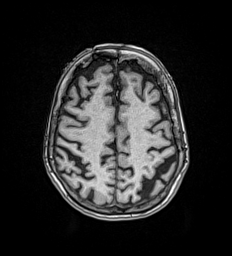
[im 141/176]
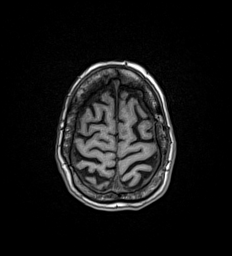
[im 158/176]
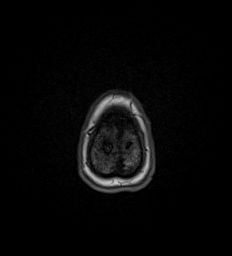
[im 176/176]
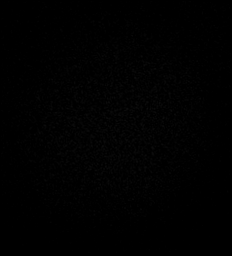

[Series 17: T2 · coronal · 5.0mm · 0.69mm/px · 2 of 31 slices shown (2 of 2)]
[im 1/31]
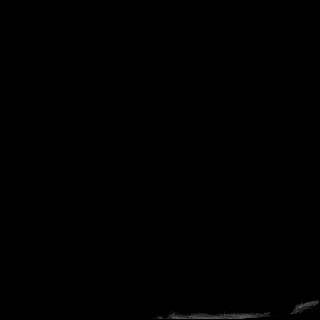
[im 31/31]
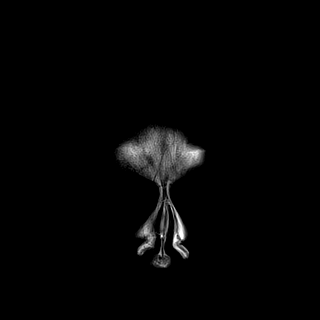

[Series 25: T1 post-contrast · coronal · 5.0mm · 0.57mm/px · 2 of 29 slices shown (1 of 3)]
[im 1/29]
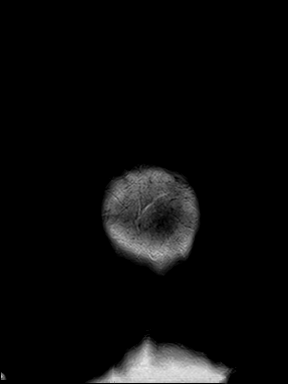
[im 29/29]
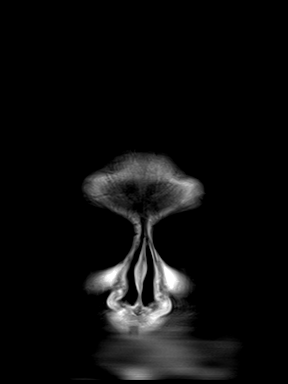

[Series 26: T1 post-contrast · sagittal · 5.0mm · 0.62mm/px · 1 of 21 slices shown (2 of 3)]
[im 1/21]
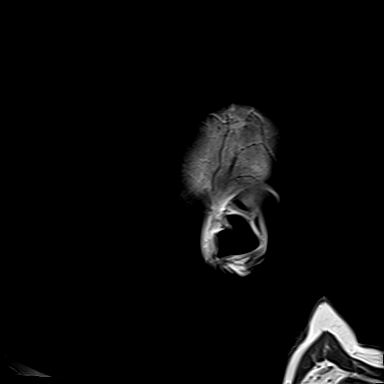

[Series 27: T1 post-contrast · axial · 1.0mm · 0.98mm/px · z∈[-33,+135]mm · 11 of 176 slices shown (3 of 3)]
[im 1/176]
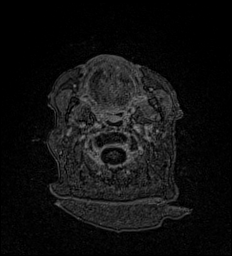
[im 18/176]
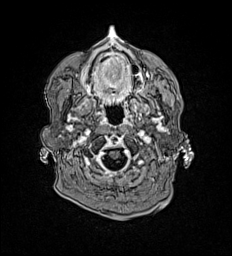
[im 36/176]
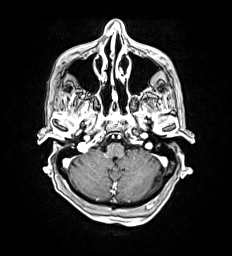
[im 53/176]
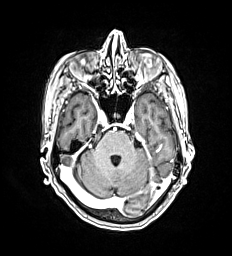
[im 71/176]
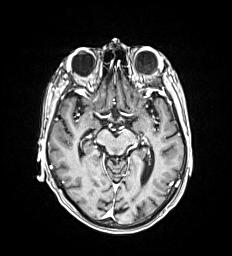
[im 88/176]
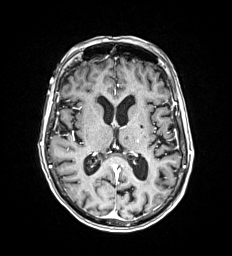
[im 106/176]
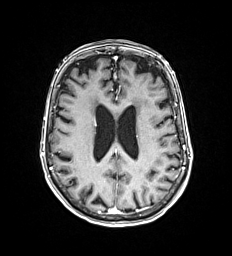
[im 123/176]
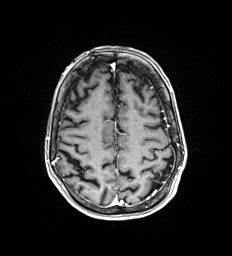
[im 141/176]
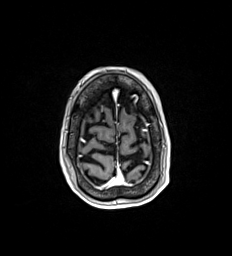
[im 158/176]
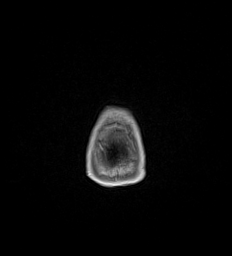
[im 176/176]
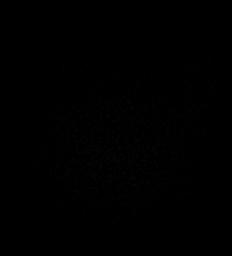

[48 of 48 positions shown; findings below may reference images not displayed]

FINDINGS: Brain: Diffusion imaging does not show any acute or subacute
infarction. Mild chronic small-vessel change affects the pons. Old
small vessel infarction of the left thalamus. Mild chronic
small-vessel ischemic changes of the cerebral hemispheric deep white
matter. No cortical or large vessel territory infarction. After
contrast administration, no abnormal enhancement occurs. No sign of
metastatic disease.

Vascular: Major vessels at the base of the brain show flow.

Skull and upper cervical spine: Negative

Sinuses/Orbits: Clear/normal

Other: None
IMPRESSION: 1. No evidence of metastatic disease.
2. Mild chronic small-vessel ischemic changes as outlined above.

## 2021-12-27 IMAGING — CT CT ANGIO CHEST
2 of 6 series · 16 of 46 positions shown · IV contrast (APPLIED)
Comparison: CT 01/31/2020, PET-CT 01/25/2020

CLINICAL DATA: Lung cancer with increasing left lower chest pain,
worse with coughing, recent radiation therapy

EXAM:
CT ANGIOGRAPHY CHEST WITH CONTRAST
TECHNIQUE: Multidetector CT imaging of the chest was performed using the
standard protocol during bolus administration of intravenous
contrast. Multiplanar CT image reconstructions and MIPs were
obtained to evaluate the vascular anatomy.
CONTRAST:  75mL OMNIPAQUE IOHEXOL 350 MG/ML SOLN

[Series 5: thins · axial · 0.61mm/px · z∈[-307,-62]mm · 14 of 269 slices shown]
[im 12/269  lung]
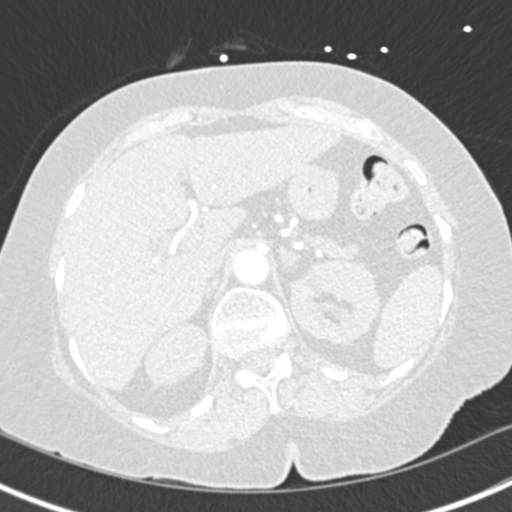
[im 35/269  soft-tissue]
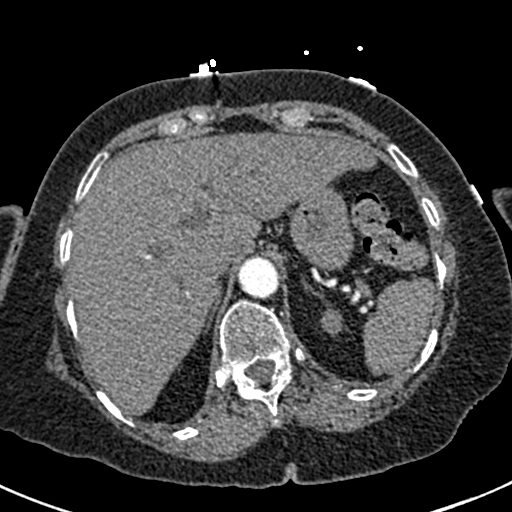
[im 47/269  lung]
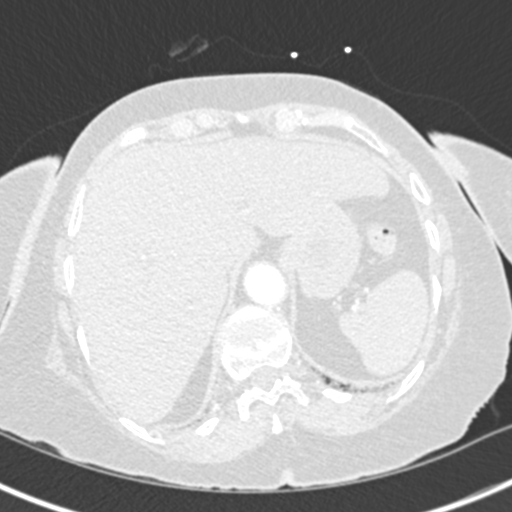
[im 70/269  soft-tissue]
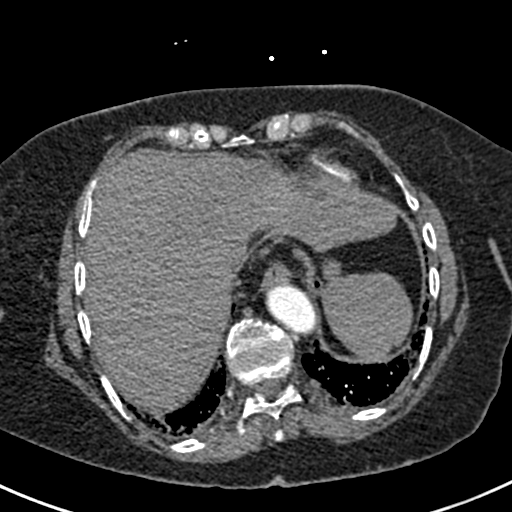
[im 94/269  lung]
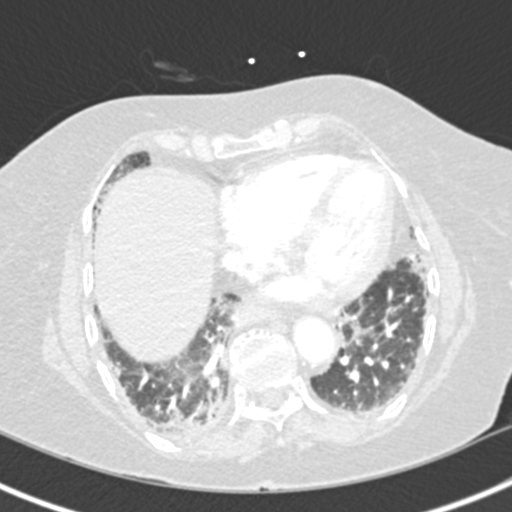
[im 105/269  soft-tissue]
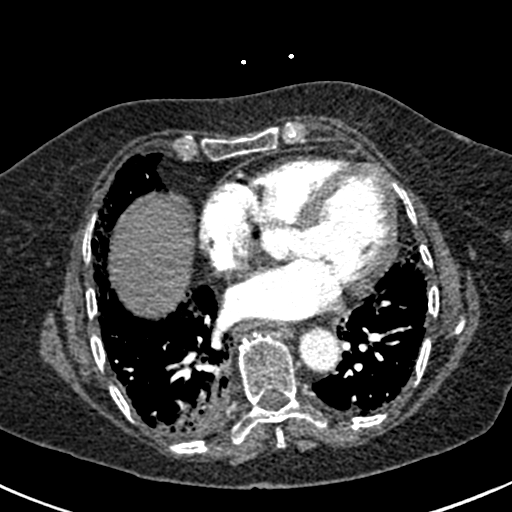
[im 129/269  lung]
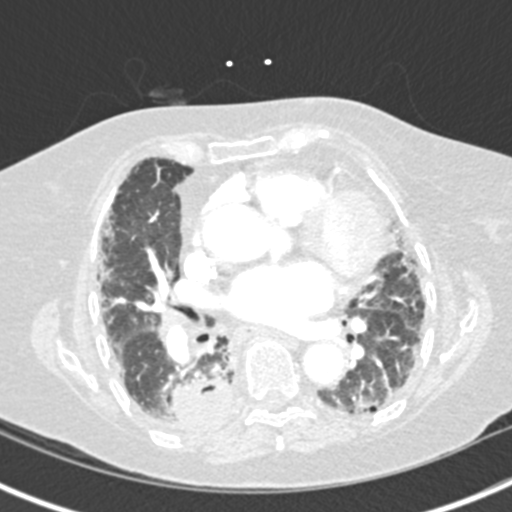
[im 140/269  soft-tissue]
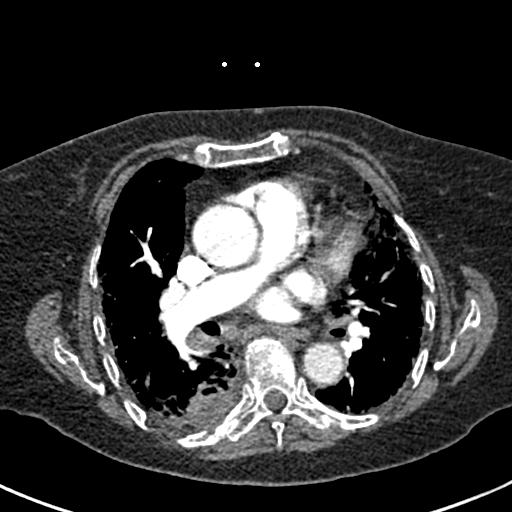
[im 164/269  lung]
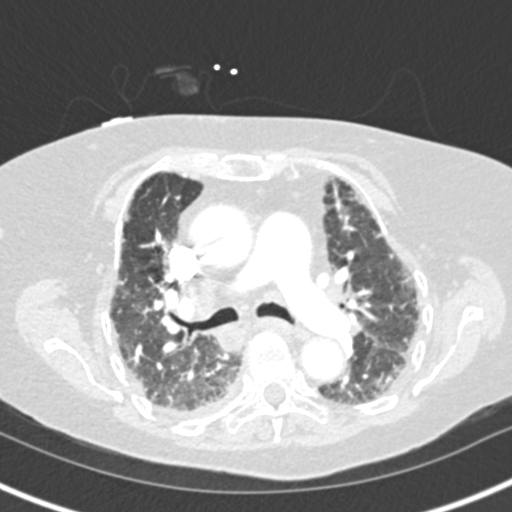
[im 175/269  soft-tissue]
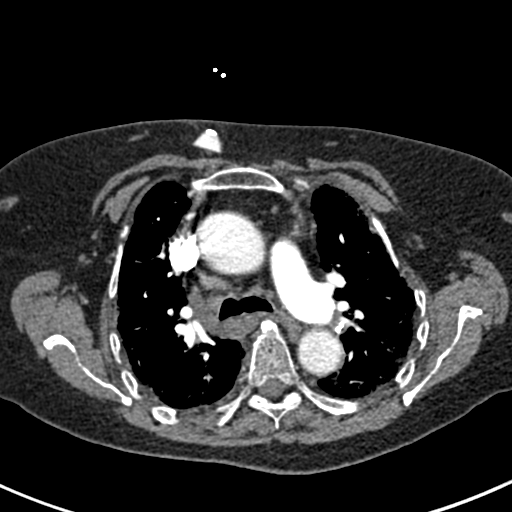
[im 199/269  lung]
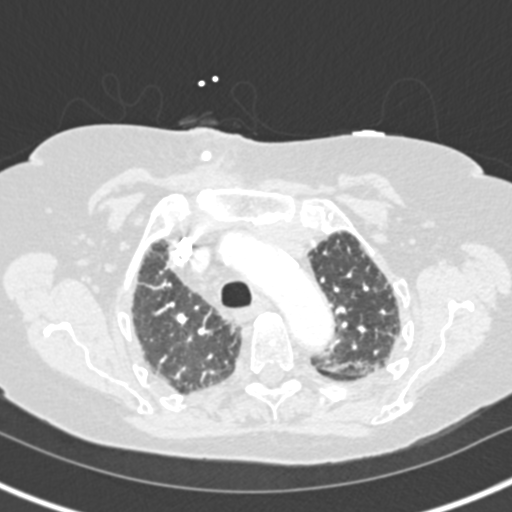
[im 222/269  soft-tissue]
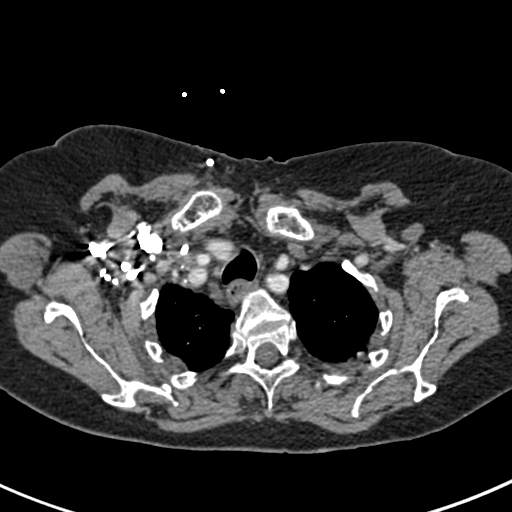
[im 234/269  lung]
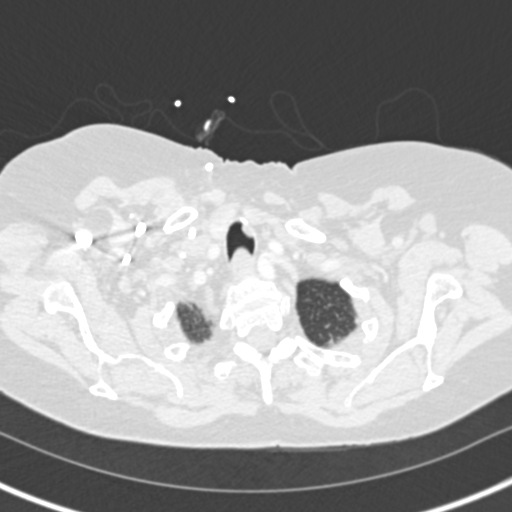
[im 257/269  soft-tissue]
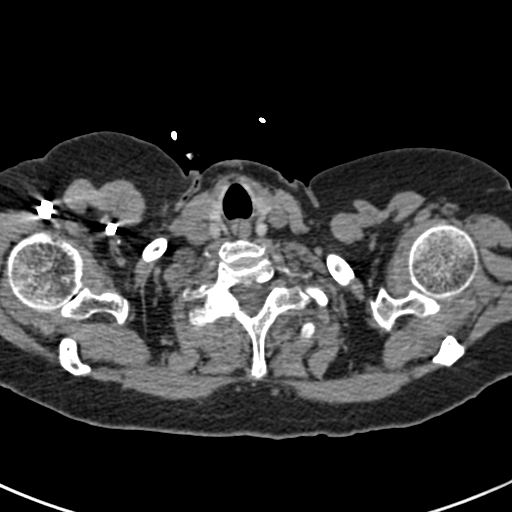

[Series 7: coronal mpr · coronal · 0.58mm/px · 2 of 79 slices shown]
[im 27/79  soft-tissue]
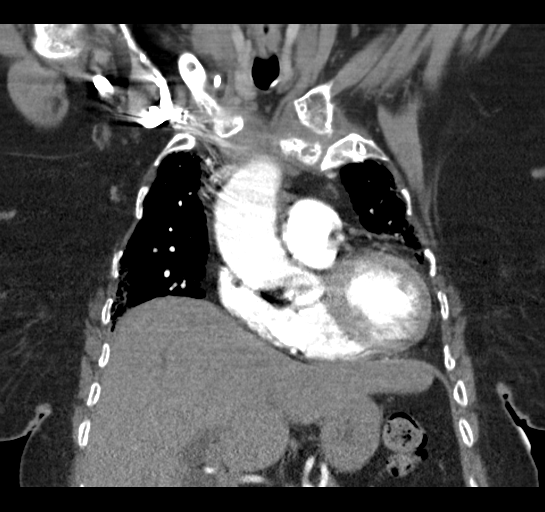
[im 53/79  soft-tissue]
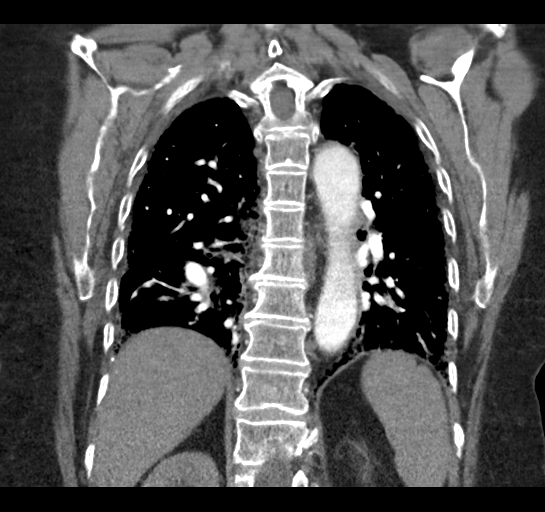

[16 of 46 positions shown; findings below may reference images not displayed]

FINDINGS: Cardiovascular: Satisfactory opacification the pulmonary arteries to
the segmental level. No pulmonary artery filling defects are
identified. Central pulmonary arteries are borderline enlarged
though stable from prior in keeping with chronic pulmonary arterial
hypertension. Cardiac size is top normal. No pericardial effusion.
Coronary artery atherosclerosis. Calcifications of the aortic
leaflets. Atherosclerotic plaque within the normal caliber aorta. No
acute luminal abnormality of the imaged aorta. No periaortic
stranding or hemorrhage. Normal 3 vessel branching of the aortic
arch. Proximal great vessels are calcified but otherwise
unremarkable. Right IJ approach Port-A-Cath tip terminates in the
lower SVC. No other major venous abnormality.

Mediastinum/Nodes: Redemonstration of an enlarged right
hilar/azygoesophageal lymph node (4/41 measuring up to 14 mm short
axis. Similar to comparison. Additional enlarged 11 mm precarinal
lymph node (4/34) is also unchanged from prior. No new or
significantly enlarged adenopathy is evident with other shotty
mediastinal and hilar nodes. No mediastinal fluid or gas. Normal
thyroid gland and thoracic inlet. No acute abnormality of the
trachea or esophagus.

Lungs/Pleura: Redemonstration of a pleural based mass in the
posterior segment right lower lobe measuring 4.1 by 2.9 cm,
minimally increased in size from prior which may be at least
partially attributable with some adjacent passive atelectasis.
Central necrosis and cavitation is again seen. There is no clear
extension into the chest wall however there may be trace pleural
fluid and pleural thickening. Additional calcified nodules in the
posterior left upper lobe unchanged. No new concerning pulmonary
nodules or masses are seen. There is some mild interlobular septal
thickening and vascular cephalization which could reflect at least
mild interstitial edema. Background of subpleural reticular fibrotic
changes accentuated by atelectasis.

Upper Abdomen: Per chart, patient is post cholecystectomy. There is
intra and extrahepatic biliary ductal dilatation which is similar to
prior may be related to senescent change and post cholecystectomy
reservoir effect however a possible periampullary mass was
identified on comparison. Hypoattenuating probable cyst seen in the
upper left kidney. Multiple splenic calcifications are compatible
with splenic granulomata. Indeterminate nodular thickening of the
left adrenal gland.

Musculoskeletal: Multilevel degenerative changes are present in the
imaged portions of the spine. Some chronic upper thoracic
compression deformities are noted. No worrisome chest wall lesion or
chest wall involvement of the right lower lobe mass.

Review of the MIP images confirms the above findings.
IMPRESSION: 1. No evidence of acute pulmonary embolism.
2. Chronic central pulmonary artery enlargement compatible with
pulmonary artery hypertension.
3. Atelectatic changes and some likely some mild interstitial edema
in the lungs.
4. Background of more chronic subpleural reticular fibrosis.
5. Slight interval increase in size of the necrotic pleural based
mass in the posterior segment right lower lobe, now measuring up to
4.1 cm, may be related to some increasing atelectatic changes in the
immediate vicinity. No clear extension into the chest wall however
there may be trace pleural fluid and pleural thickening. Recommend
close attention on follow-up.
6. Stable nodal disease in the right hilum and mediastinum.
7. Indeterminate nodular thickening of the left adrenal gland, while
this was previously PET negative, do recommend continued attention
on follow-up imaging.
8. Stable intra and extrahepatic biliary ductal dilatation, possibly
related to senescent change and post cholecystectomy reservoir
effect however a possible periampullary mass was identified on
comparison though without demonstrable PET avidity on more remote
comparison. Correlate with symptoms and serologies, and could
consider further evaluation with direct visualization as recommended
previously.
9. Aortic Atherosclerosis ([id number]).

## 2022-01-14 IMAGING — CT CT ANGIO CHEST
2 of 6 series · 18 of 36 positions shown · IV contrast (omnipaque)
Comparison: 03/24/2020

CLINICAL DATA: Tachycardia

EXAM:
CT ANGIOGRAPHY CHEST WITH CONTRAST
TECHNIQUE: Multidetector CT imaging of the chest was performed using the
standard protocol during bolus administration of intravenous
contrast. Multiplanar CT image reconstructions and MIPs were
obtained to evaluate the vascular anatomy.
CONTRAST:  100mL OMNIPAQUE IOHEXOL 350 MG/ML SOLN

[Series 5: thins · axial · 0.71mm/px · z∈[+1247,+1510]mm · 17 of 297 slices shown]
[im 17/297  lung]
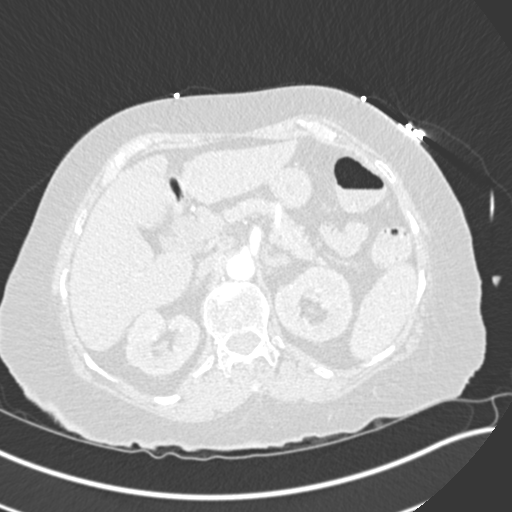
[im 33/297  mediastinal]
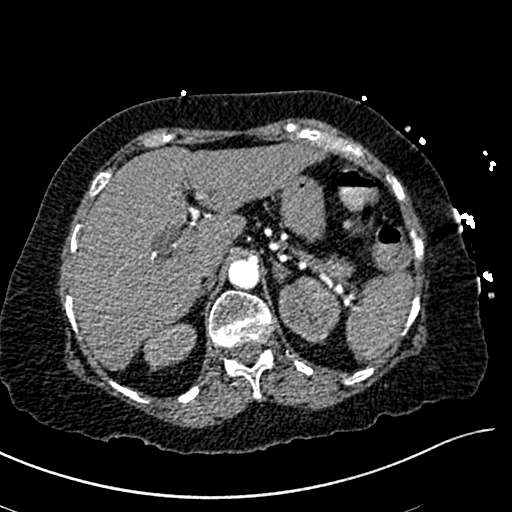
[im 50/297  lung]
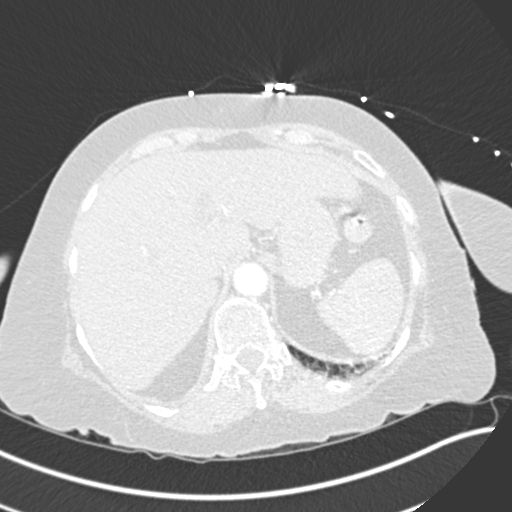
[im 66/297  mediastinal]
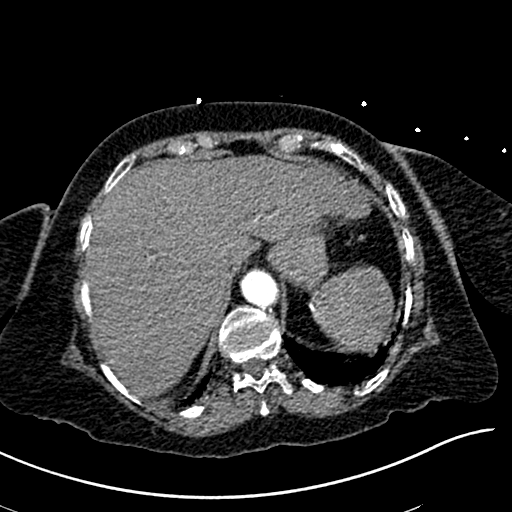
[im 83/297  lung]
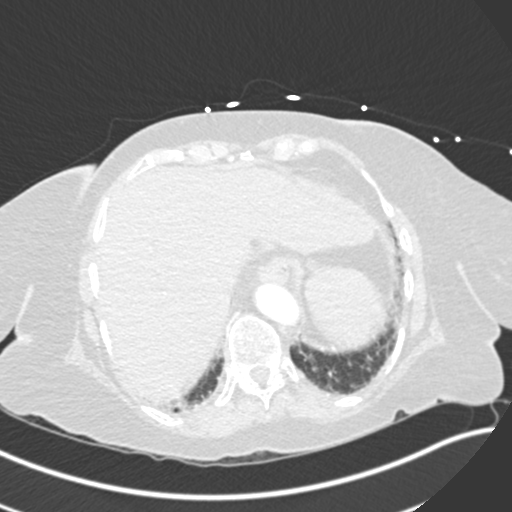
[im 99/297  mediastinal]
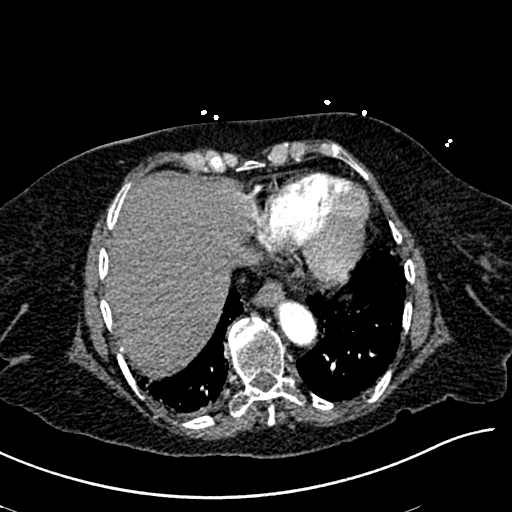
[im 116/297  lung]
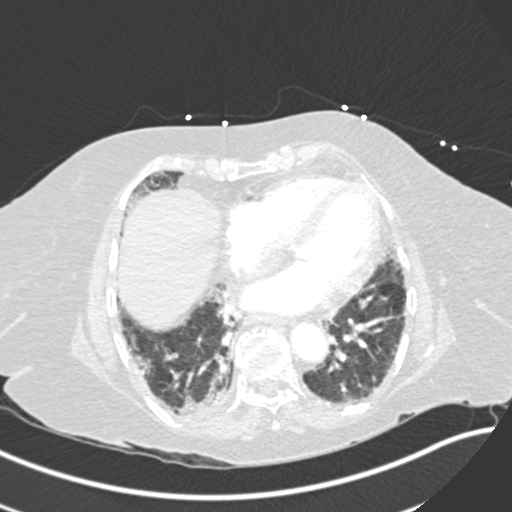
[im 132/297  mediastinal]
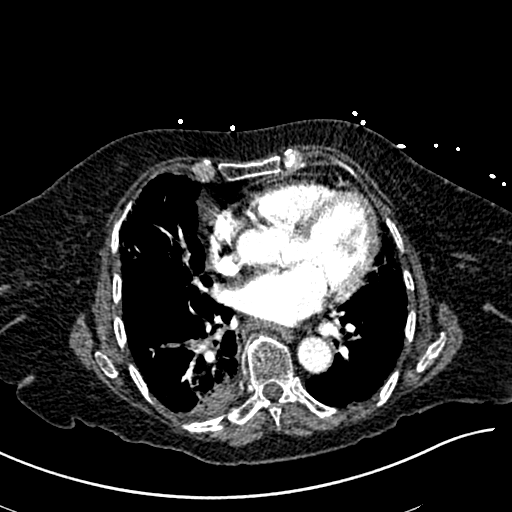
[im 149/297  lung]
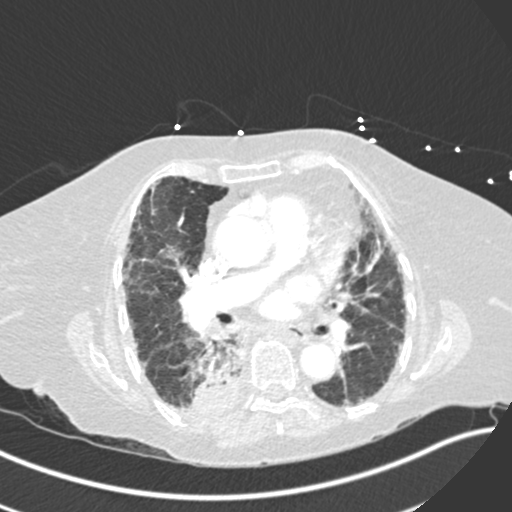
[im 165/297  mediastinal]
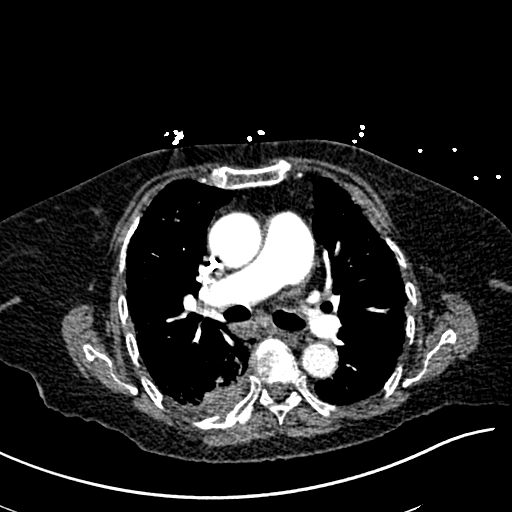
[im 181/297  lung]
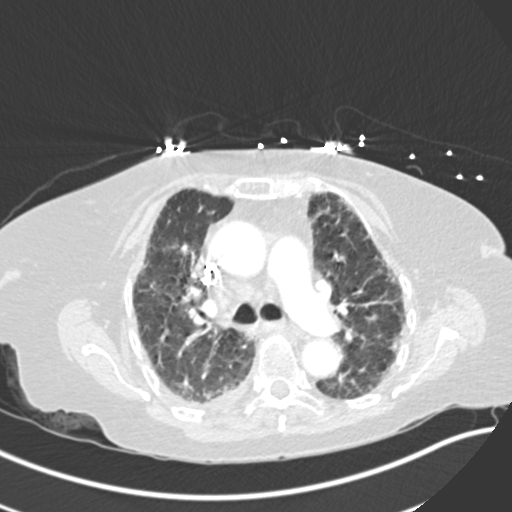
[im 198/297  mediastinal]
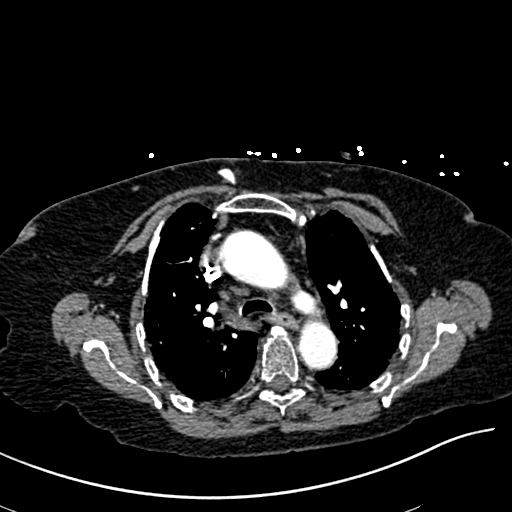
[im 214/297  lung]
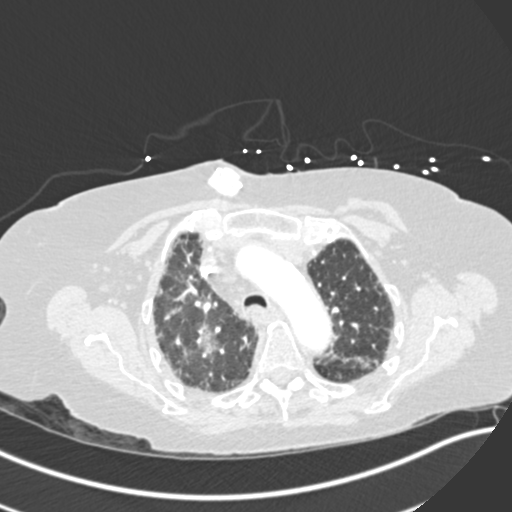
[im 231/297  mediastinal]
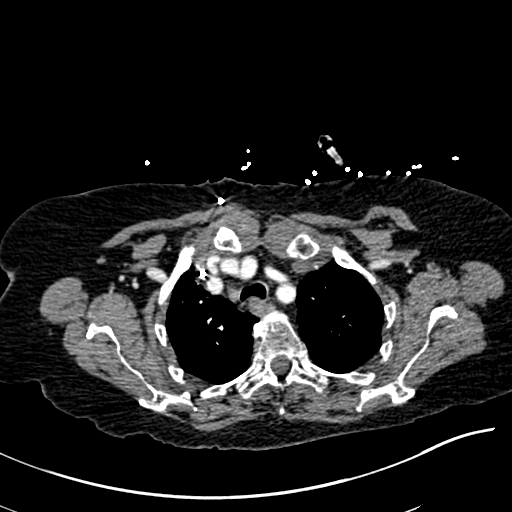
[im 247/297  lung]
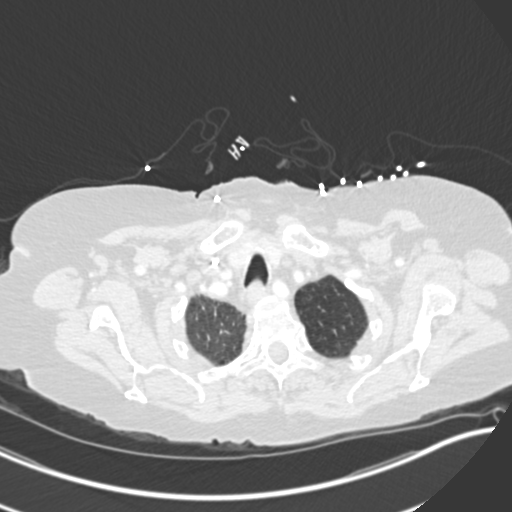
[im 264/297  mediastinal]
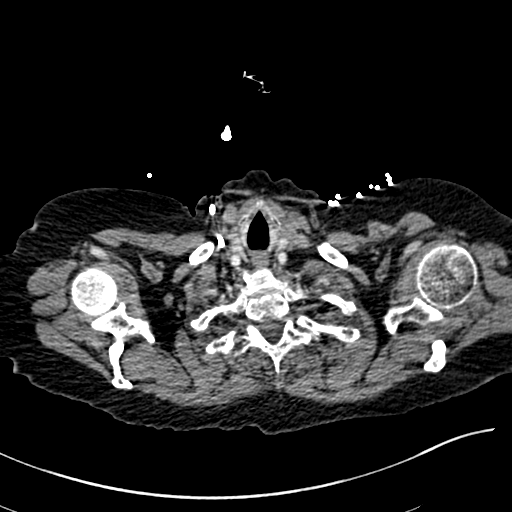
[im 280/297  lung]
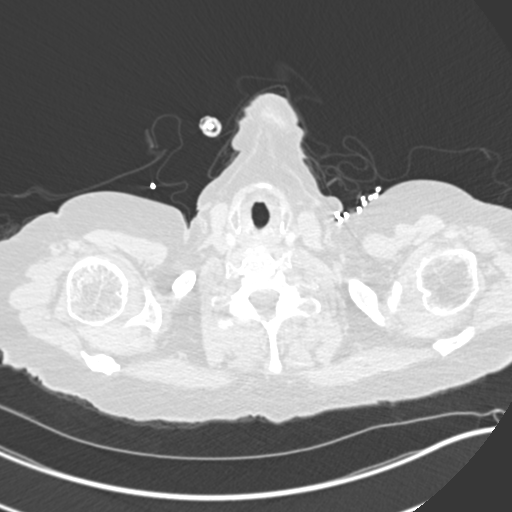

[Series 7: coronal mpr · coronal · 0.60mm/px · 1 of 136 slices shown]
[im 68/136  mediastinal]
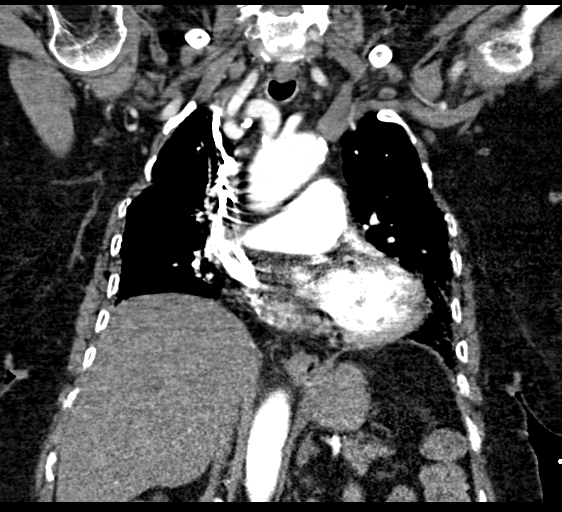

[18 of 36 positions shown; findings below may reference images not displayed]

FINDINGS: Cardiovascular: No filling defects in the pulmonary arteries to
suggest pulmonary emboli. Scattered coronary artery and aortic
calcifications. No aneurysm.

Mediastinum/Nodes: No mediastinal, hilar, or axillary adenopathy.
Trachea and esophagus are unremarkable. Thyroid unremarkable.

Lungs/Pleura: Background of fibrosis again noted throughout the
lungs. Rounded pleural base mass again noted posteriorly in the
right lower lobe measuring up to 3.8 cm, stable since prior study.
No effusions.

Upper Abdomen: No change since recent study.

Musculoskeletal: No acute bony abnormality. Old posterior right 7th
rib fracture. No acute bony abnormality. Multilevel compression
fractures in the upper thoracic spine.

Review of the MIP images confirms the above findings.
IMPRESSION: No evidence of pulmonary embolus.

Background of fibrosis within the lungs.

3.8 cm rounded necrotic mass seen in the posterior segment of the
right lower lobe, stable since prior study.

Coronary artery disease.

Aortic Atherosclerosis ([id number]).

## 2022-01-14 IMAGING — DX DG CHEST 1V PORT
1 series · 1 of 1 positions shown · non-contrast
Comparison: CT chest 03/24/2020.  Chest radiographs 02/03/2020.

CLINICAL DATA: Shortness of breath.

EXAM:
PORTABLE CHEST 1 VIEW

[chest ap]
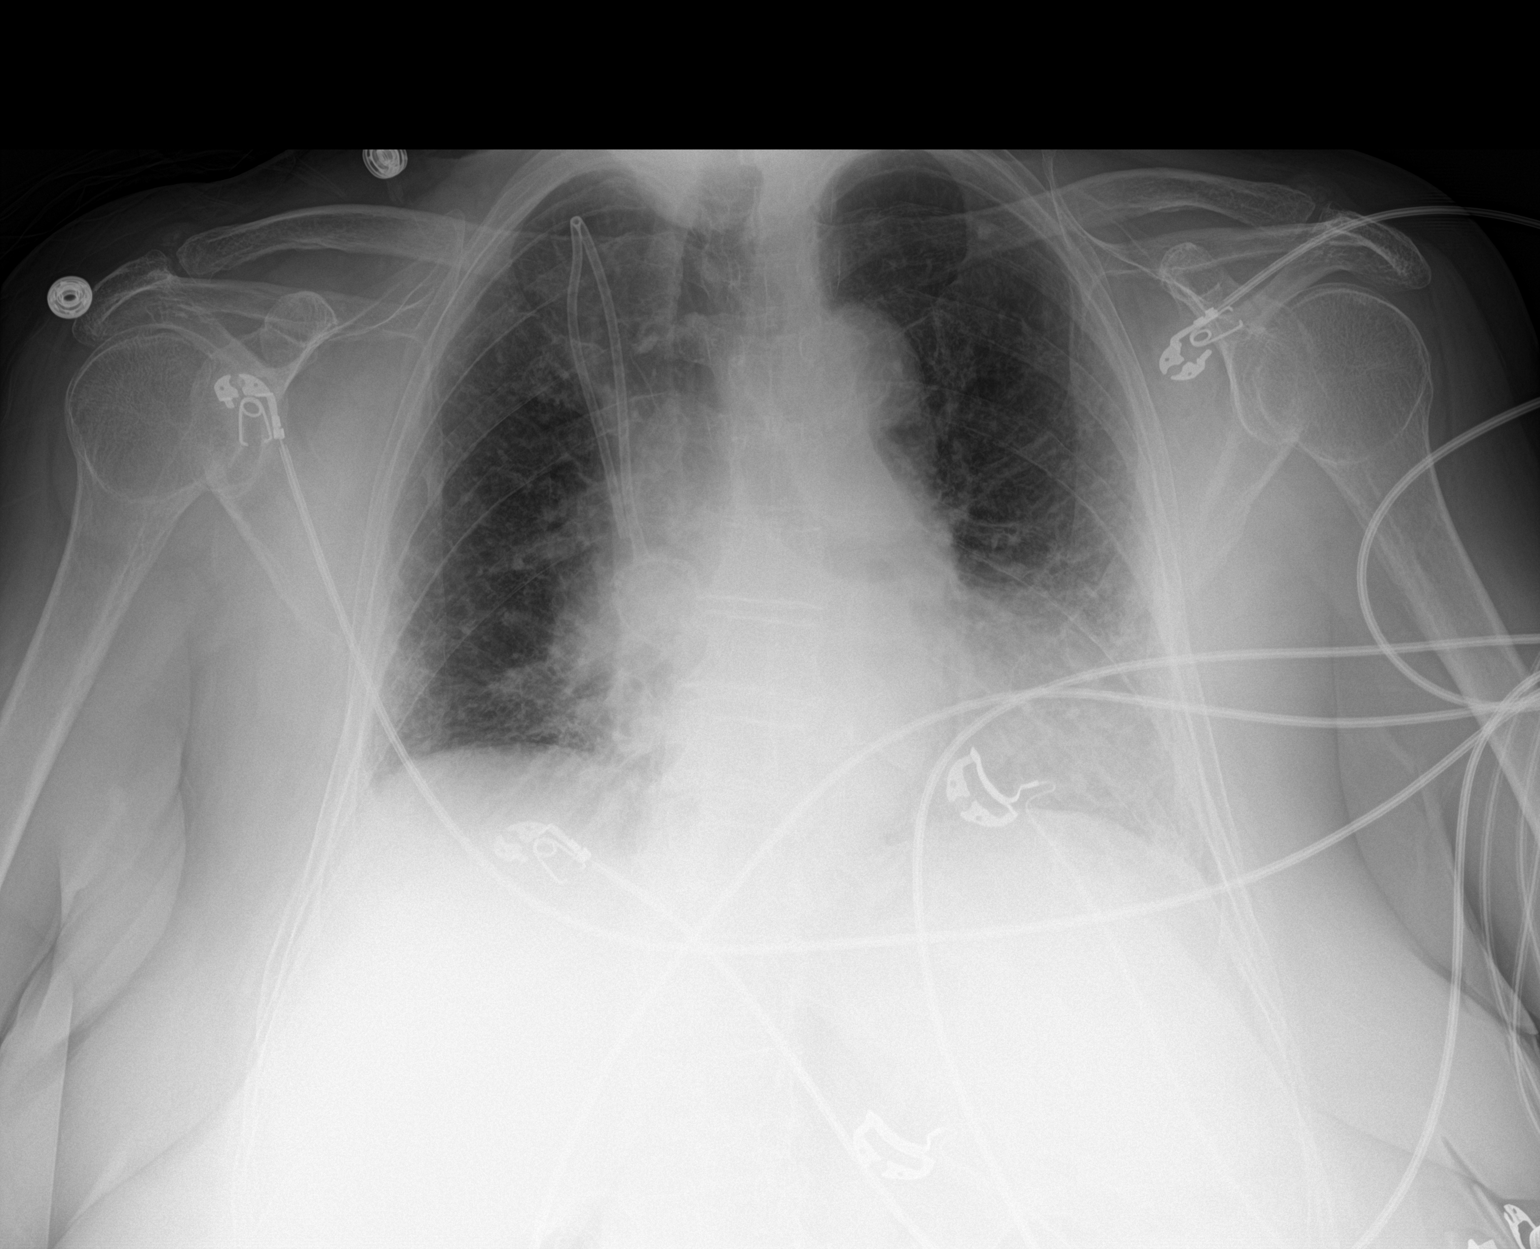

[1 of 1 positions shown; findings below may reference images not displayed]

FINDINGS: Similar cardiomediastinal contour. Right IJ approach Port-A-Cath
with the tip projecting at the inferior SVC. Rounded density
projecting over the right medial lung base corresponds to pleural
mass better characterized on prior CT chest. Peripheral prominent
interstitial opacities likely relate to fibrosis seen on recent CT
chest. No confluent consolidation. No visible pleural effusions or
pneumothorax. No acute osseous abnormality.
IMPRESSION: 1. Similar radiographic appearance of the chest.
2. Rounded density projecting over the right medial lung base
corresponds to pleural mass better characterized on prior CT chest.
CT could better evaluate for disease progression if clinically
indicated.

## 2022-01-16 IMAGING — DX DG PORTABLE PELVIS
1 series · 1 of 1 positions shown · non-contrast
Comparison: None.

CLINICAL DATA: Fall

EXAM:
PORTABLE PELVIS 1-2 VIEWS

[pelvis ap]
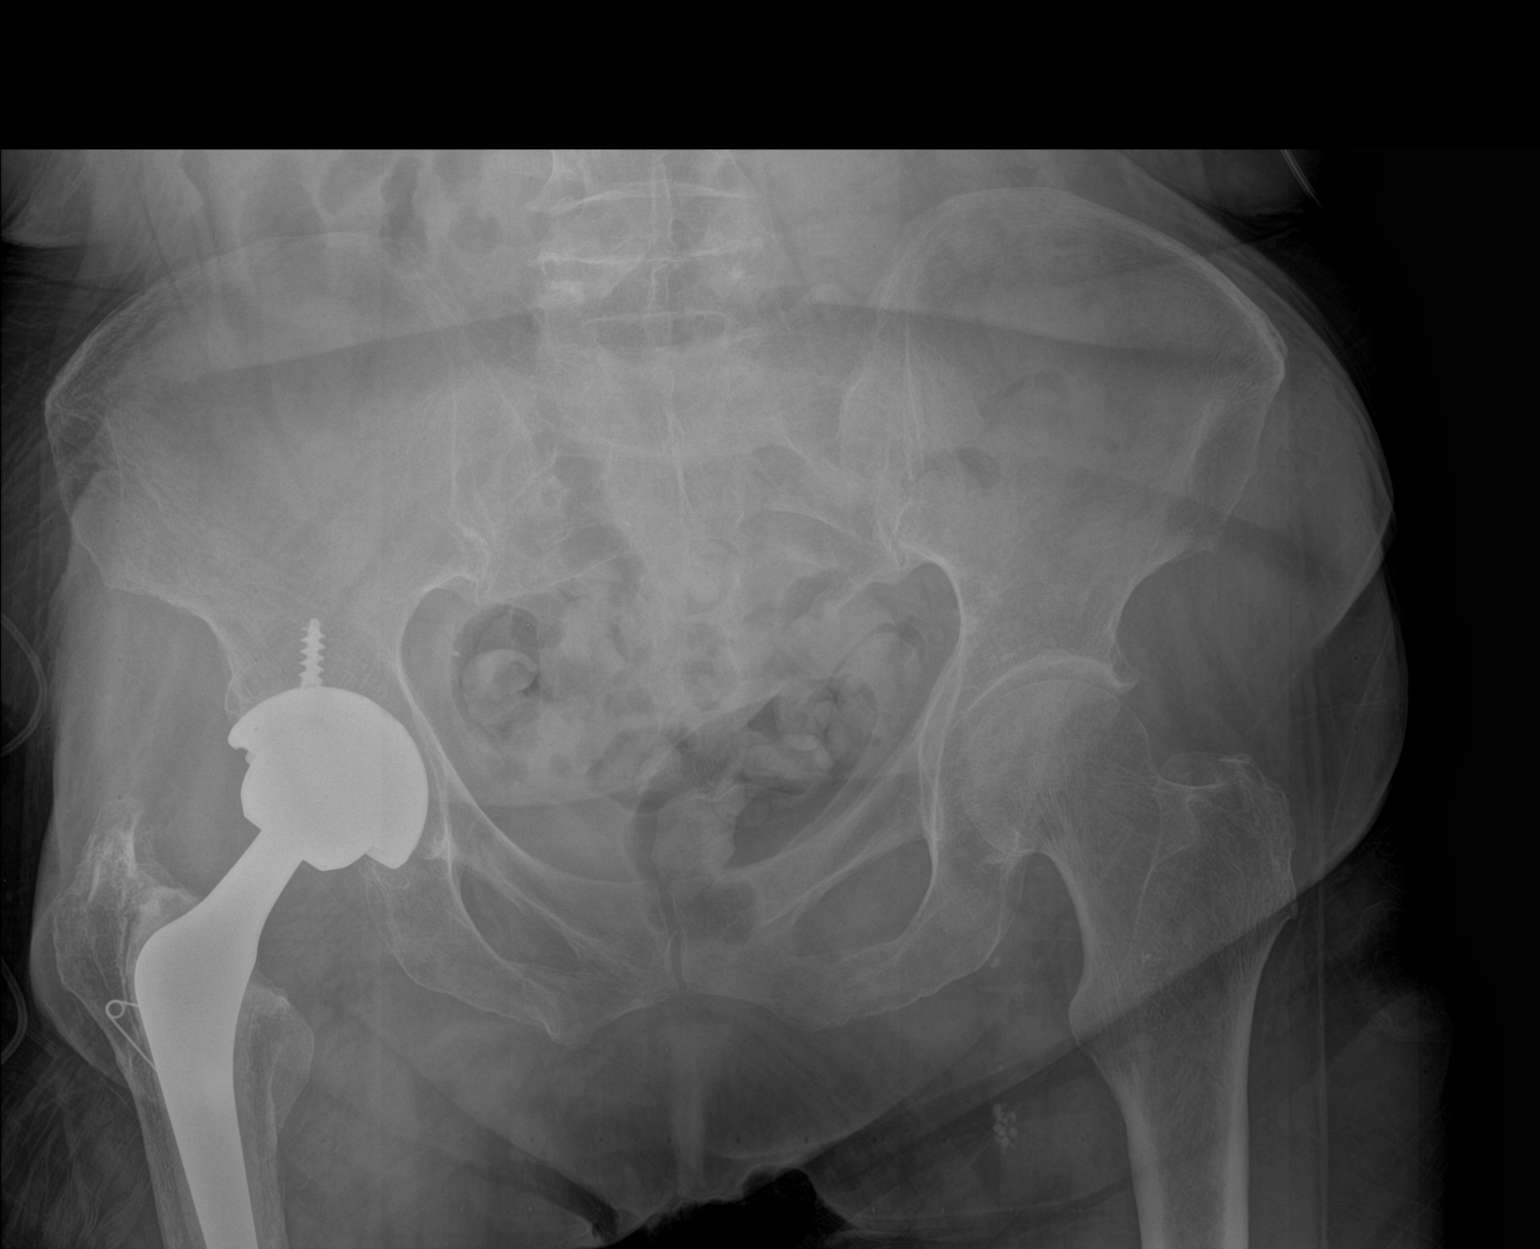

[1 of 1 positions shown; findings below may reference images not displayed]

FINDINGS: Prior right hip replacement. Normal AP alignment. Mild degenerative
changes within the left hip. SI joints symmetric and unremarkable.
No acute bony abnormality. Specifically, no fracture, subluxation,
or dislocation.
IMPRESSION: No acute bony abnormality.

## 2022-01-16 IMAGING — DX DG CHEST 1V PORT
1 series · 1 of 1 positions shown · non-contrast
Comparison: 04/11/2020

CLINICAL DATA: Fall, COVID

EXAM:
PORTABLE CHEST 1 VIEW

[chest ap]
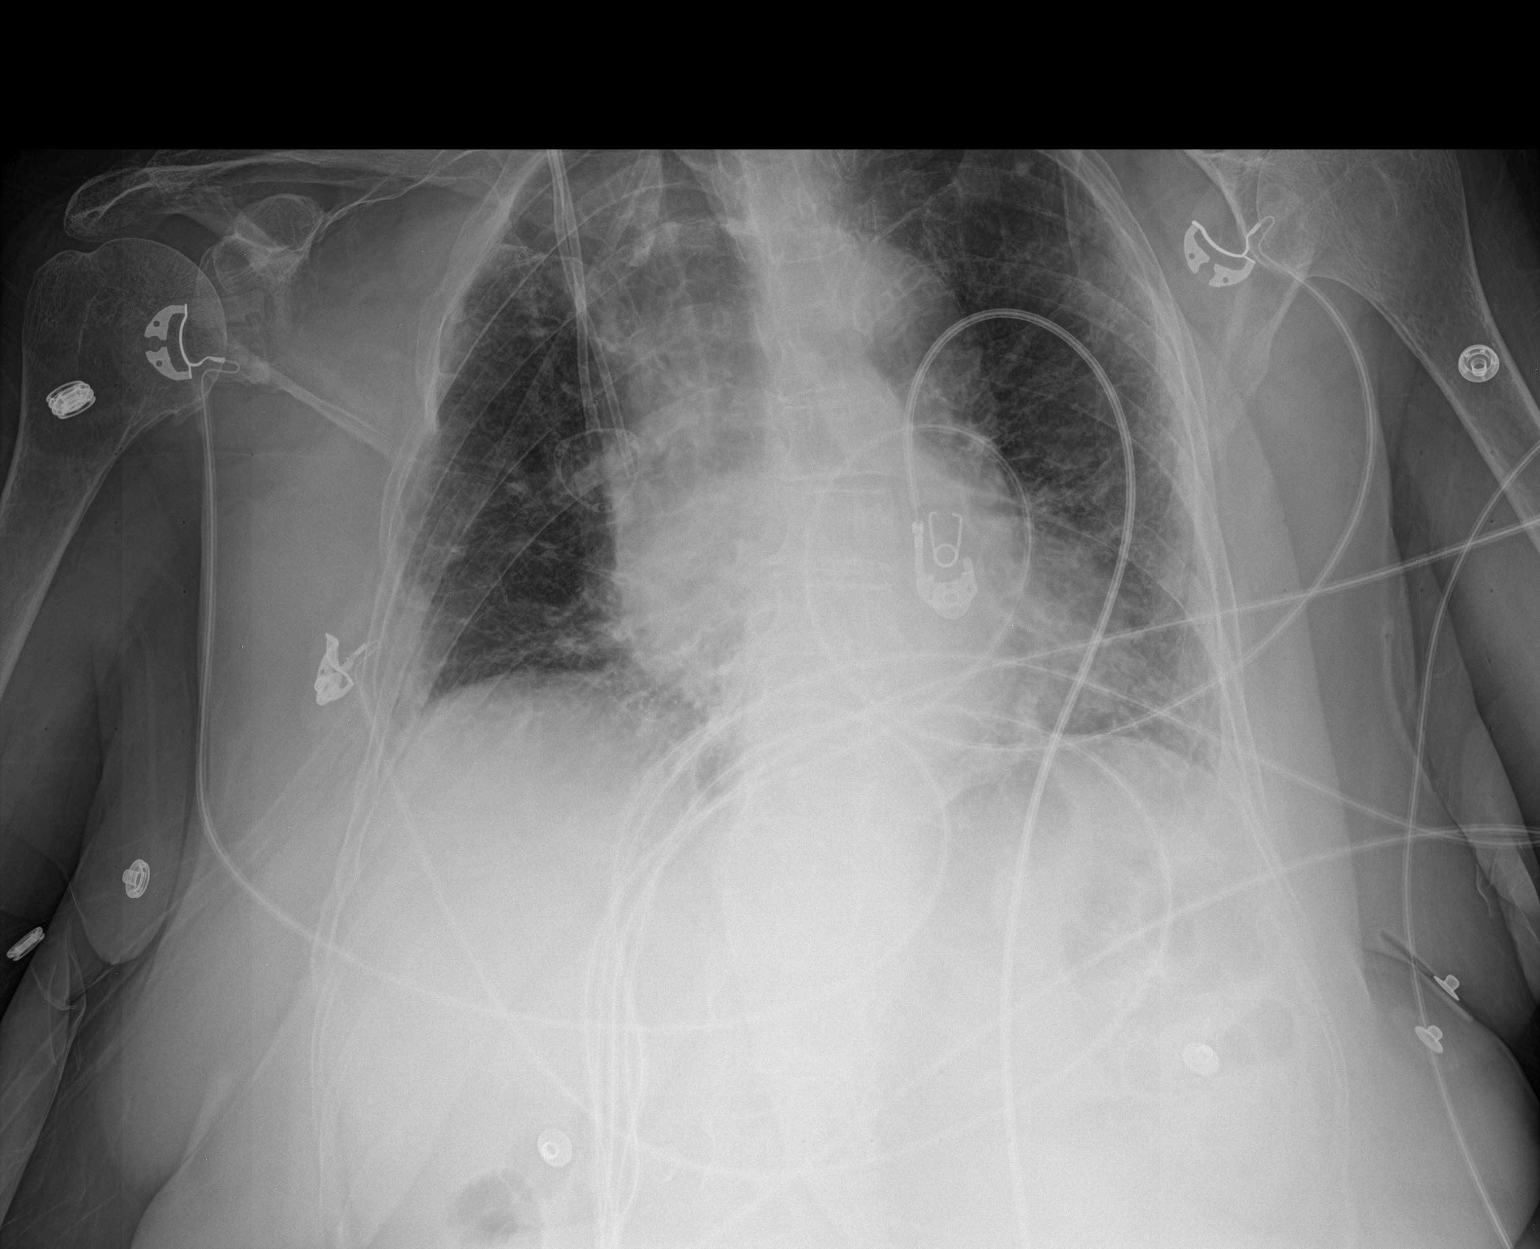

[1 of 1 positions shown; findings below may reference images not displayed]

FINDINGS: Right Port-A-Cath remains in place, unchanged. Heart is normal size.
Low lung volumes. Diffuse interstitial prominence throughout the
lungs compatible with chronic lung disease/fibrosis. No definite
acute confluent airspace opacity. Previously seen rounded mass
posteriorly in the right lung better seen on prior CT. No effusions.
IMPRESSION: Stable appearance of the chest.

## 2022-01-16 IMAGING — CT CT HEAD W/O CM
4 series · 15 of 47 positions shown, 17 images · non-contrast
Comparison: CT dated 08/28/2019.

CLINICAL DATA: 79-year-old female with head trauma.

EXAM:
CT HEAD WITHOUT CONTRAST
CT CERVICAL SPINE WITHOUT CONTRAST
TECHNIQUE: Multidetector CT imaging of the head and cervical spine was
performed following the standard protocol without intravenous
contrast. Multiplanar CT image reconstructions of the cervical spine
were also generated.

[Series 3: head 5.0 mpr ax · axial · 0.39mm/px · z∈[-134,+4]mm · 7 of 38 slices shown, 9 images]
[im 5/38  brain]
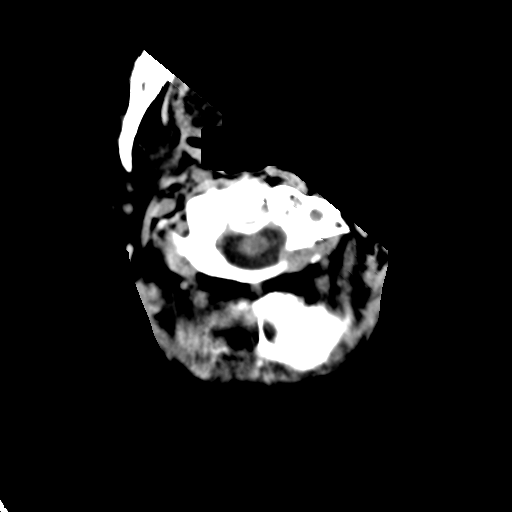
[im 5/38  bone]
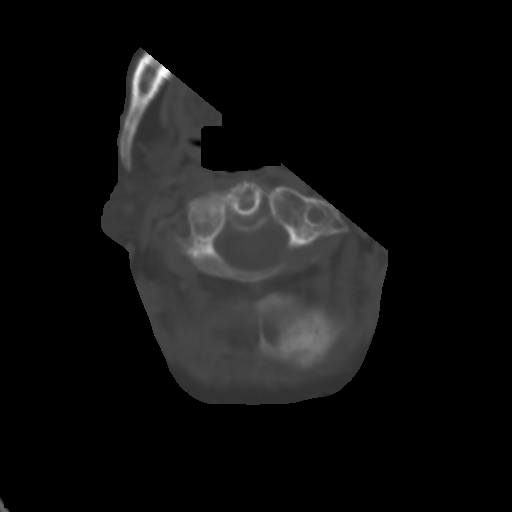
[im 10/38  brain]
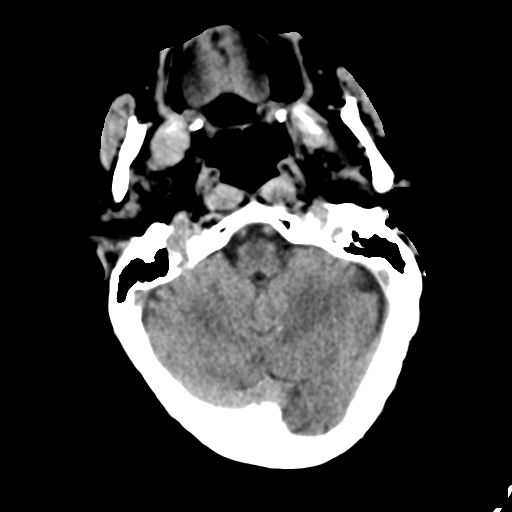
[im 14/38  brain]
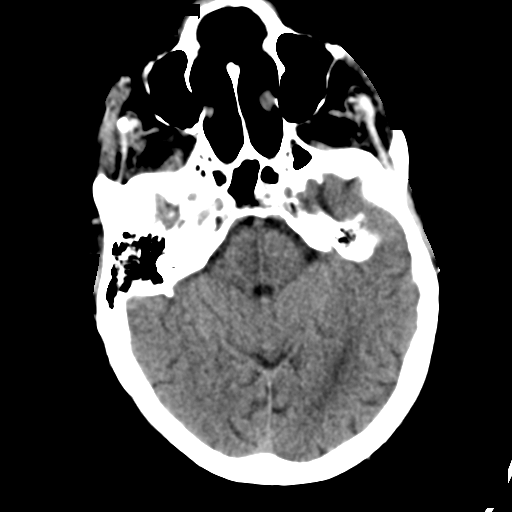
[im 19/38  brain]
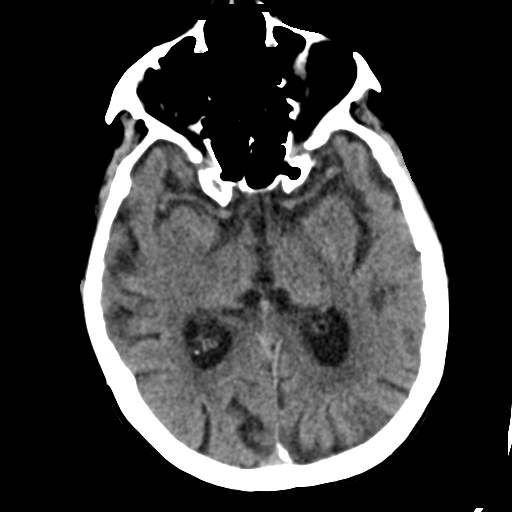
[im 24/38  brain]
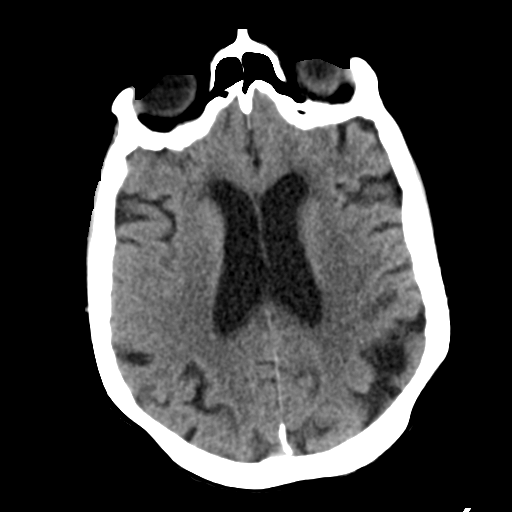
[im 24/38  bone]
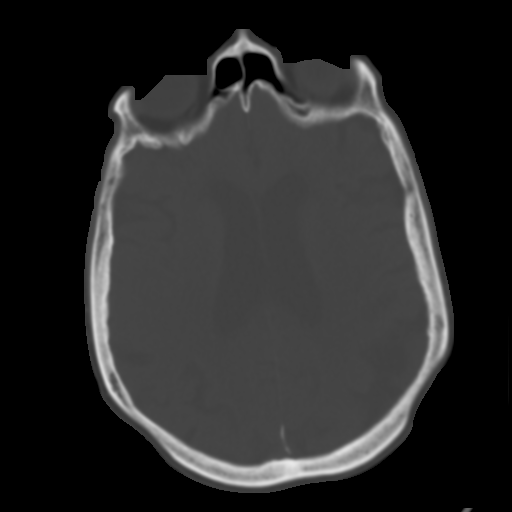
[im 28/38  brain]
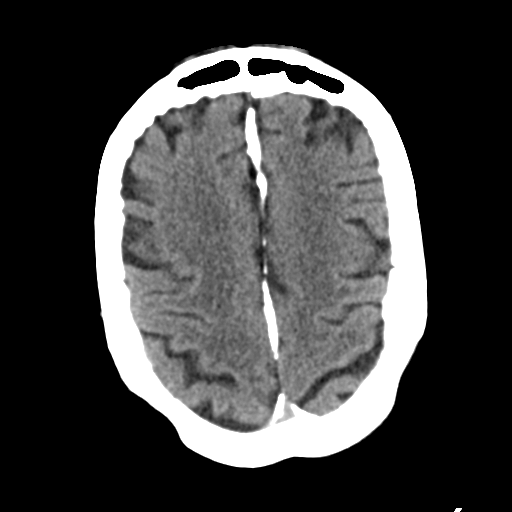
[im 33/38  brain]
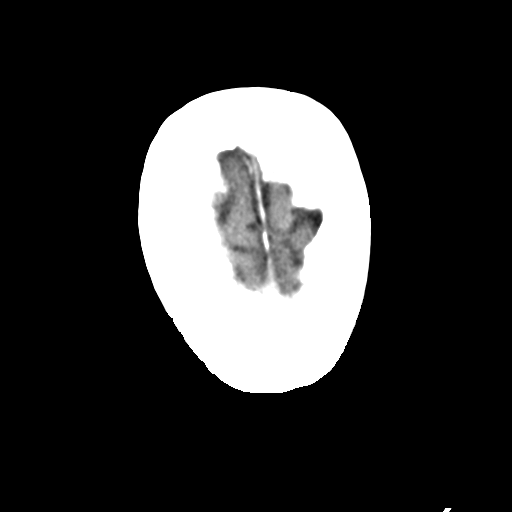

[Series 4: head 2.0 h70h · axial · 0.39mm/px · z∈[-121,-103]mm · 2 of 86 slices shown]
[im 9/86  brain]
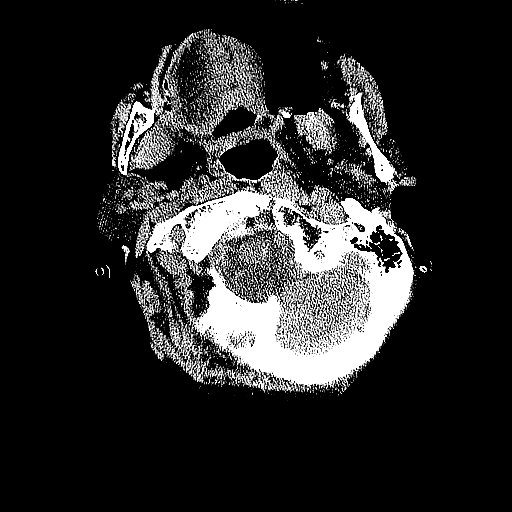
[im 18/86  brain]
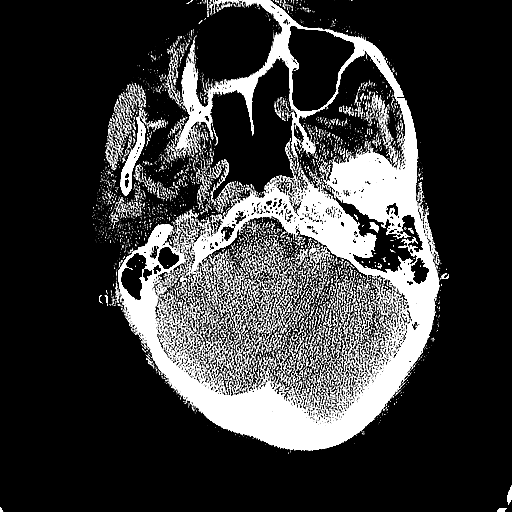

[Series 5: head 3.0 mpr cor · coronal · 0.34mm/px · 3 of 72 slices shown]
[im 24/72  brain]
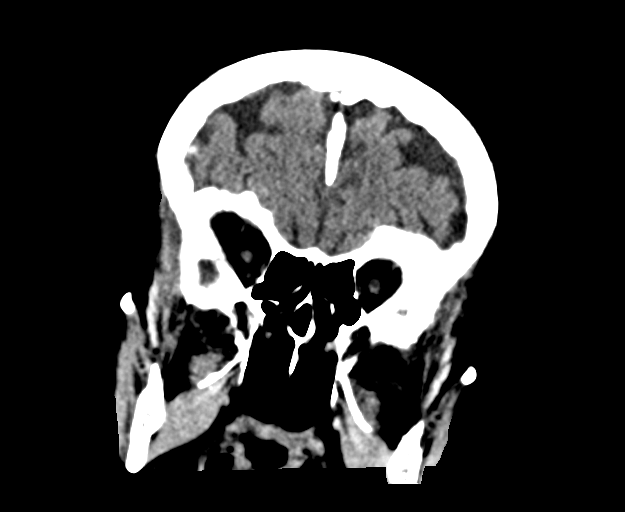
[im 32/72  brain]
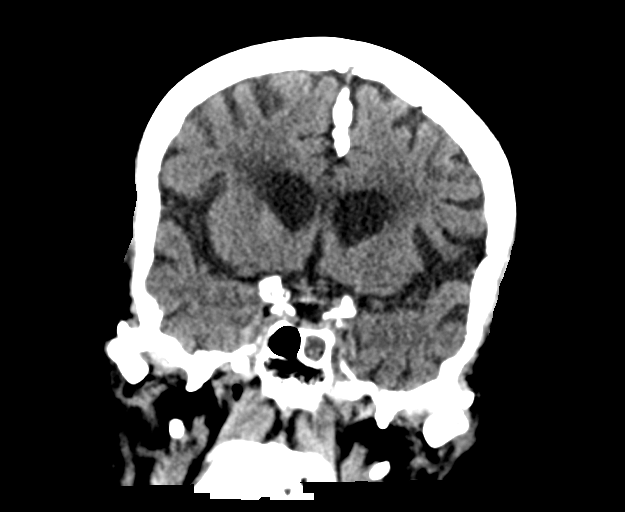
[im 40/72  brain]
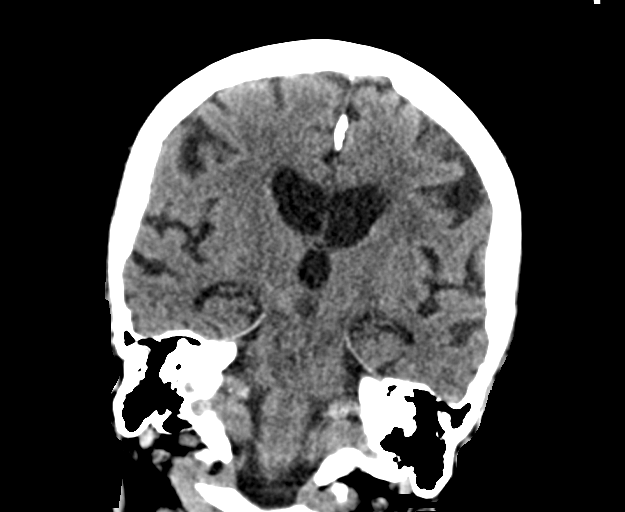

[Series 6: head 3.0 mpr sag · sagittal · 0.34mm/px · 3 of 64 slices shown]
[im 22/64  brain]
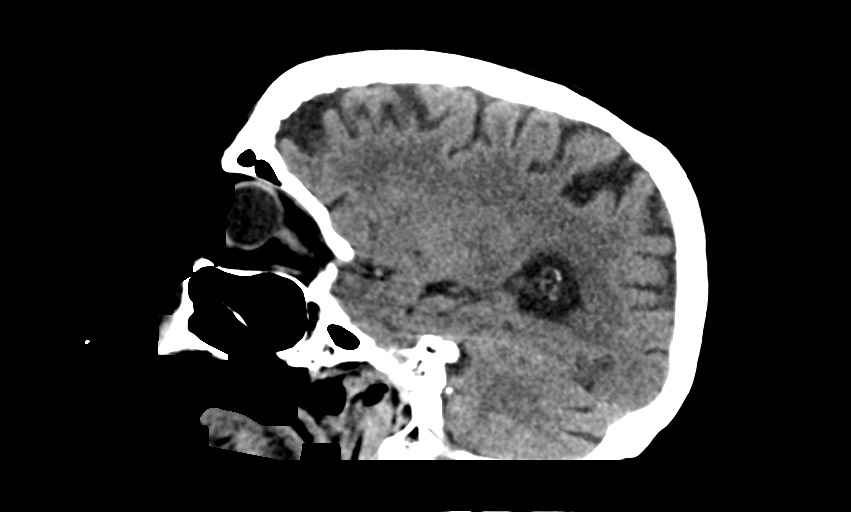
[im 32/64  brain]
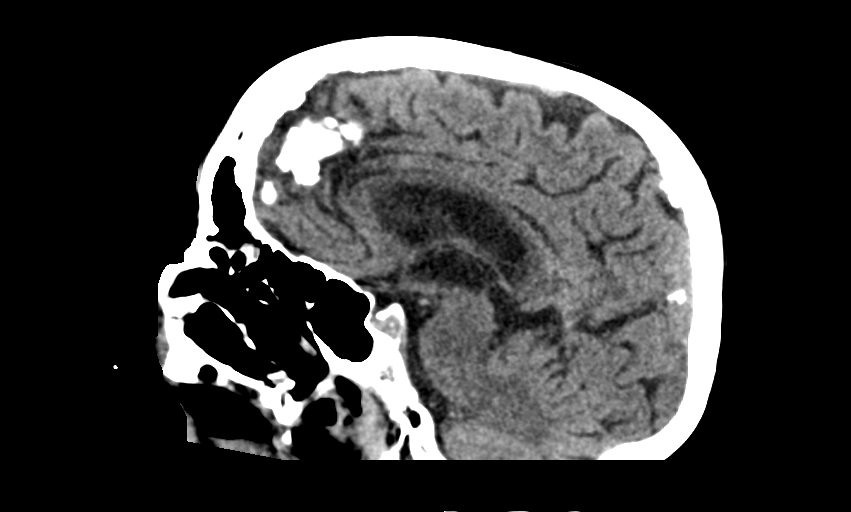
[im 43/64  brain]
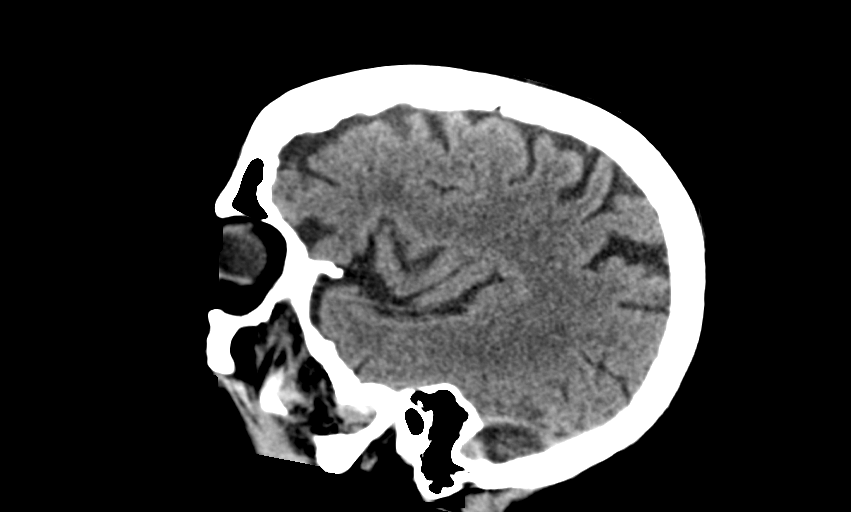

[15 of 47 positions shown; findings below may reference images not displayed]

FINDINGS: CT HEAD FINDINGS

Brain: Mild age-related atrophy and chronic microvascular ischemic
changes. There is no acute intracranial hemorrhage. No mass effect
or midline shift. No extra-axial fluid collection.

Vascular: No hyperdense vessel or unexpected calcification.

Skull: Normal. Negative for fracture or focal lesion.

Sinuses/Orbits: No acute finding.

Other: None

CT CERVICAL SPINE FINDINGS

Alignment: No acute subluxation.

Skull base and vertebrae: No acute fracture. Osteopenia.

Soft tissues and spinal canal: No prevertebral fluid or swelling. No
visible canal hematoma.

Disc levels:  Degenerative changes. No acute findings.

Upper chest: Small left upper lobe calcified granuloma. Partially
visualized right subclavian central venous line.

Other: Bilateral carotid bulb calcified plaques.
IMPRESSION: 1. No acute intracranial pathology. Mild age-related atrophy and
chronic microvascular ischemic changes.
2. No acute/traumatic cervical spine pathology.

## 2022-01-17 IMAGING — CT CT CHEST W/O CM
2 of 4 series · 15 of 36 positions shown, 18 images · non-contrast
Comparison: Chest x-ray 04/13/2020.  CT 04/11/2020

CLINICAL DATA: Respiratory illness

EXAM:
CT CHEST WITHOUT CONTRAST
TECHNIQUE: Multidetector CT imaging of the chest was performed following the
standard protocol without IV contrast.

[Series 4: thorax 2.0 · axial · 0.72mm/px · z∈[+1329,+1553]mm · 12 of 126 slices shown, 15 images]
[im 7/126  mediastinal]
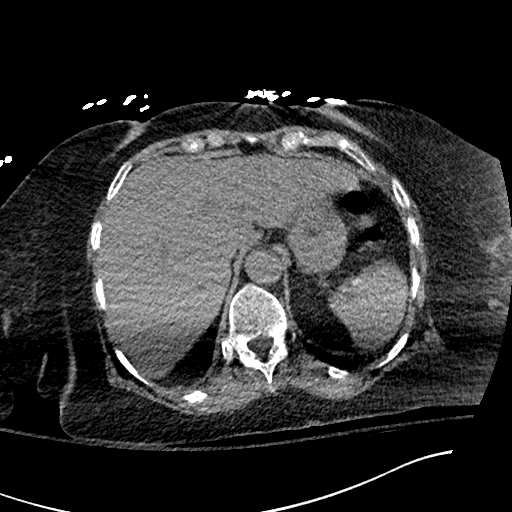
[im 7/126  lung]
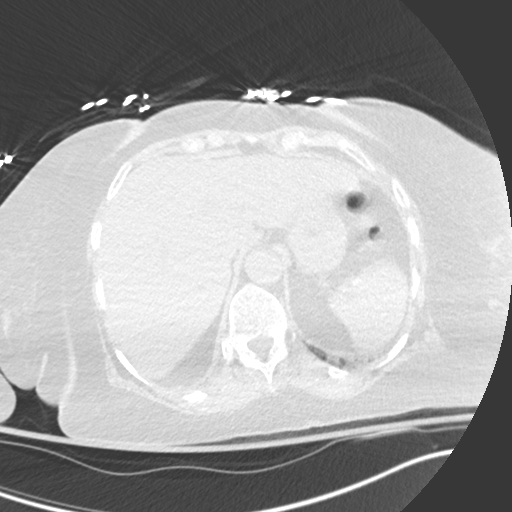
[im 20/126  lung]
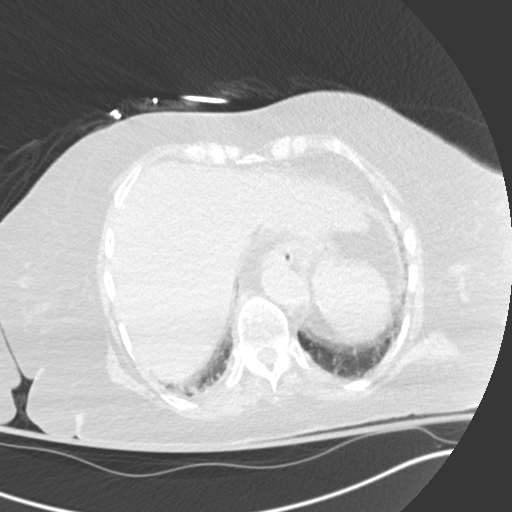
[im 27/126  lung]
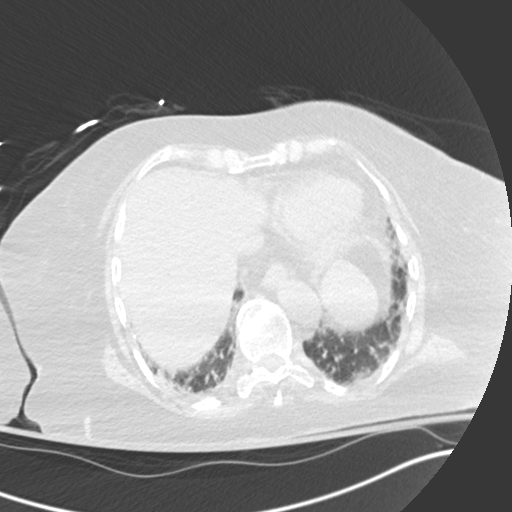
[im 40/126  lung]
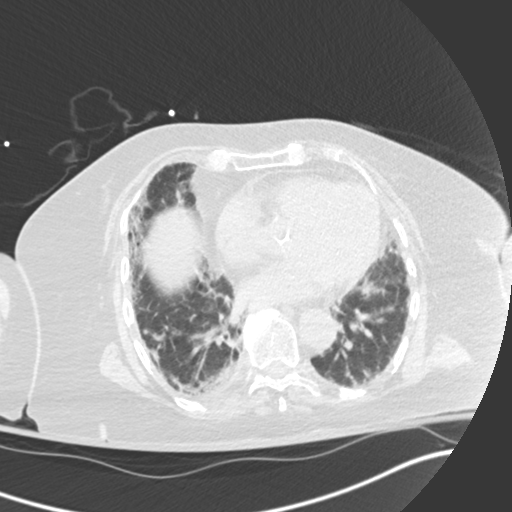
[im 47/126  mediastinal]
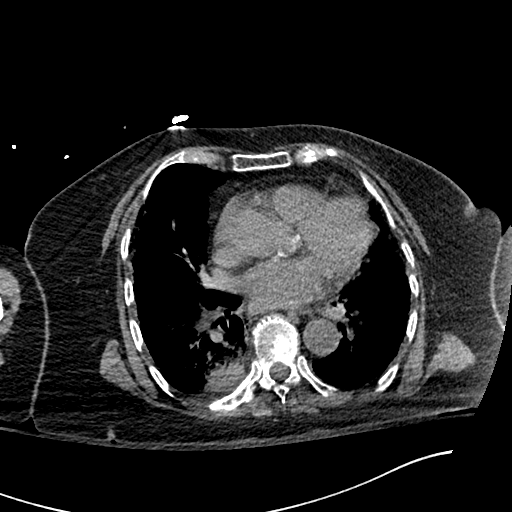
[im 47/126  lung]
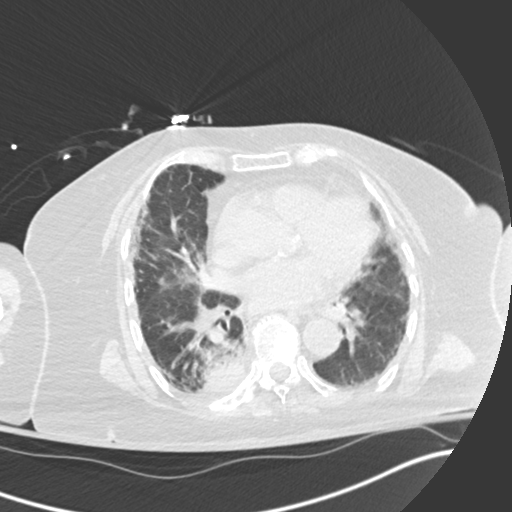
[im 60/126  lung]
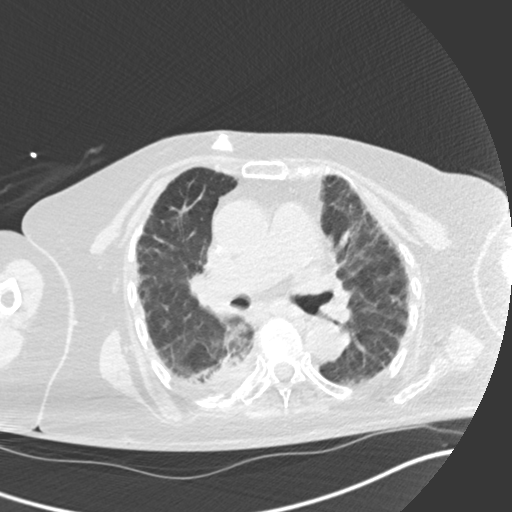
[im 66/126  lung]
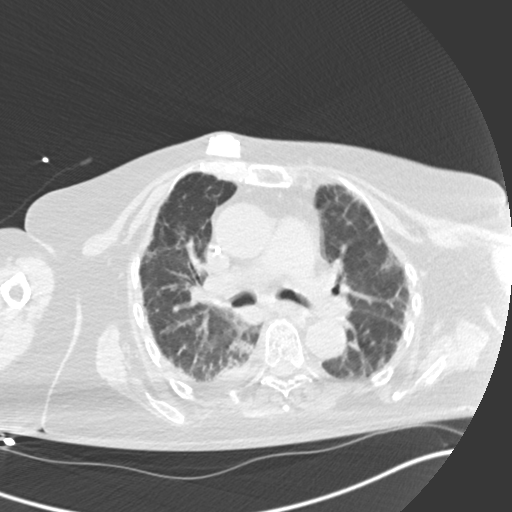
[im 79/126  lung]
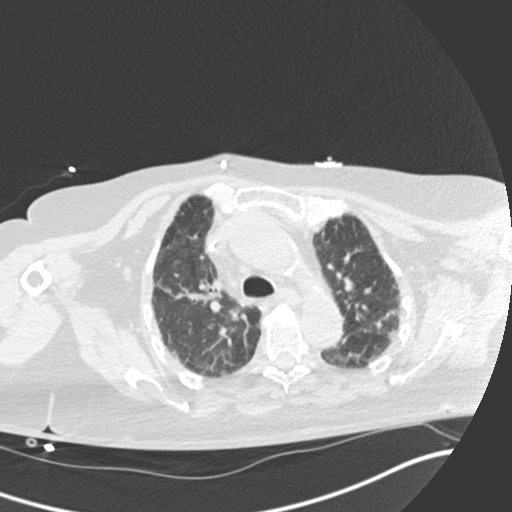
[im 86/126  mediastinal]
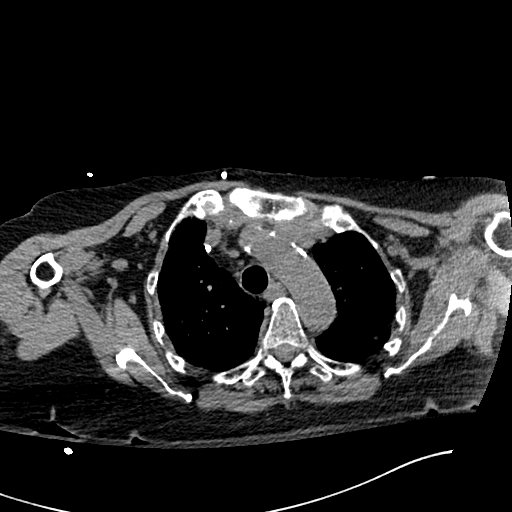
[im 86/126  lung]
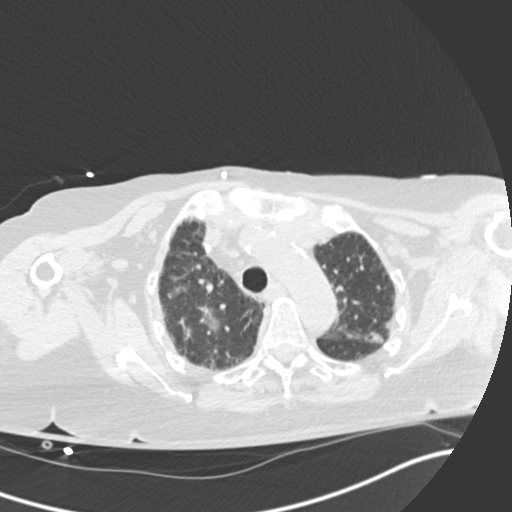
[im 99/126  lung]
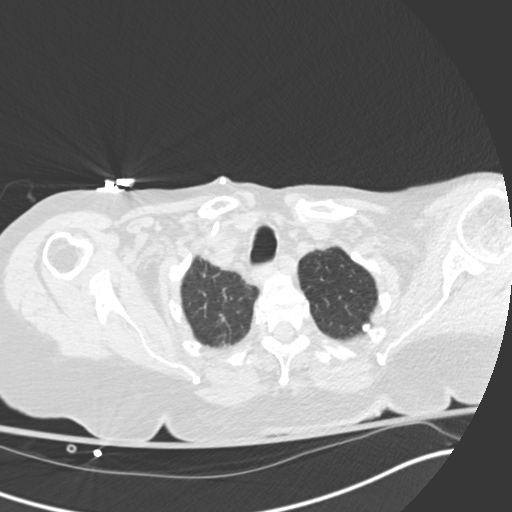
[im 106/126  lung]
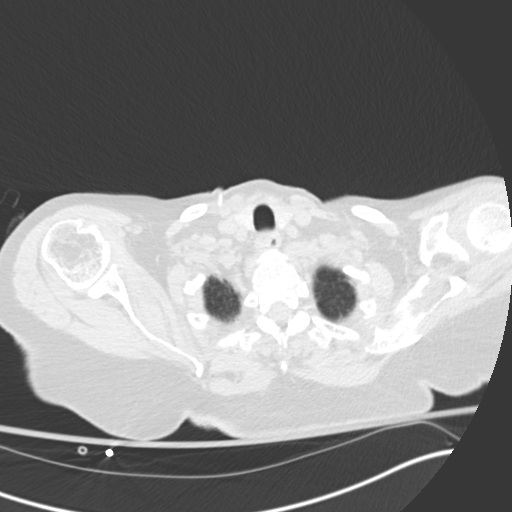
[im 119/126  lung]
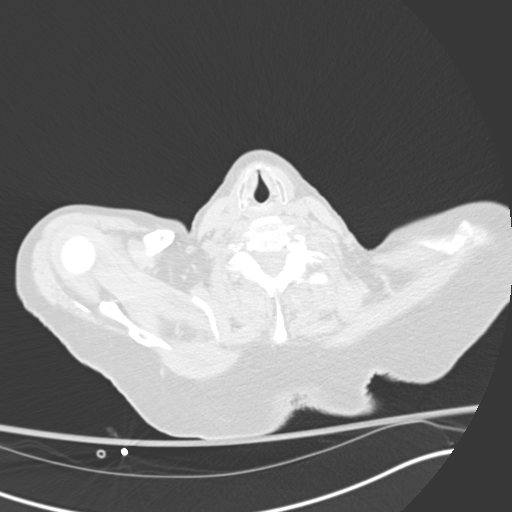

[Series 6: coronal · coronal · 0.43mm/px · 3 of 101 slices shown]
[im 21/101  lung]
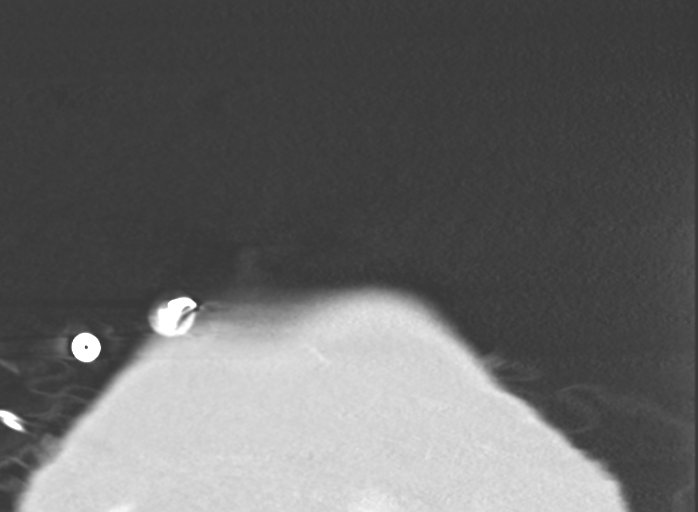
[im 41/101  lung]
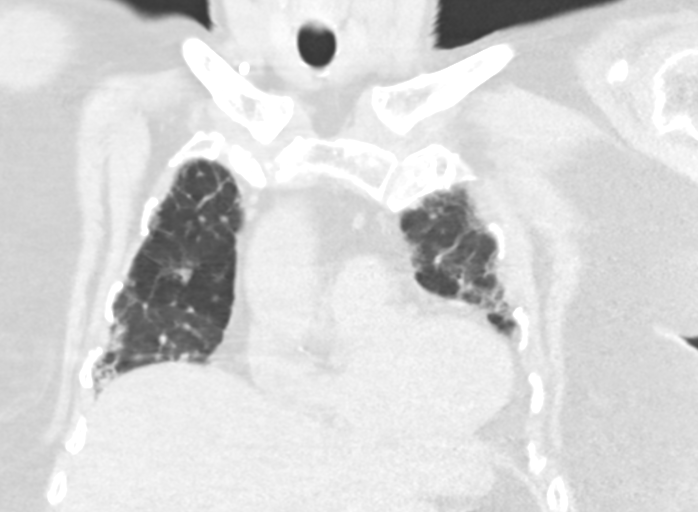
[im 61/101  lung]
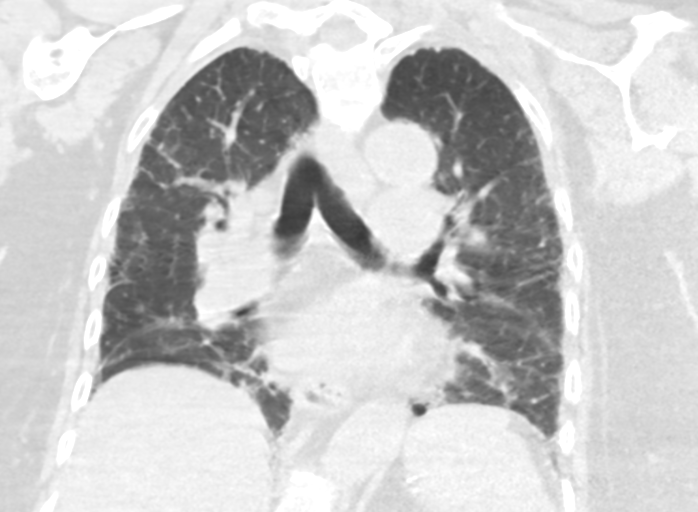

[15 of 36 positions shown; findings below may reference images not displayed]

FINDINGS: Cardiovascular: Aortic atherosclerosis. Scattered coronary artery
calcifications. Heart is normal size and aorta is normal caliber.

Mediastinum/Nodes: Small scattered non pathologically enlarged
mediastinal lymph nodes, stable. No mediastinal, hilar, or axillary
adenopathy. Trachea and esophagus are unremarkable. Thyroid
unremarkable.

Lungs/Pleura: Peripheral interstitial prominence in thickening
compatible with fibrosis, stable. Rounded partially necrotic mass in
the posterior right lower lobe measures 3.1 cm and is slightly
decreased in size since prior study when this measured 3.8 cm. No
effusions. No confluent opacities on the left. Calcified granuloma
in the left upper lobe.

Upper Abdomen: Imaging into the upper abdomen demonstrates no acute
findings.

Musculoskeletal: Chest wall soft tissues are unremarkable. Right
upper chest wall Port-A-Cath remains in place, unchanged. No acute
bony abnormality. Old posterior right 7th rib fracture and upper
thoracic compression fractures, unchanged.
IMPRESSION: Partially necrotic mass in the posterior right lower lobe is again
noted, slightly decreased in size since prior study, 3.1 cm
currently compared to 3.8 cm previously.

Changes of fibrosis throughout the lungs.

Coronary artery disease.

Aortic Atherosclerosis ([id number]).

## 2022-01-20 IMAGING — MR MR HEAD W/O CM
12 of 13 series · 43 of 48 positions shown · non-contrast
Comparison: Head CT 04/13/2020.  MRI 03/04/2020.

CLINICAL DATA: Coronavirus infection. Altered mental status.
History of squamous cell carcinoma of the lung.

EXAM:
MRI HEAD WITHOUT CONTRAST
TECHNIQUE: Multiplanar, multiecho pulse sequences of the brain and surrounding
structures were obtained without intravenous contrast.

[Series 5: DWI · axial · 3.0mm · 0.88mm/px · z∈[-102,+39]mm · 6 of 96 slices shown (1 of 4)]
[im 1/96]
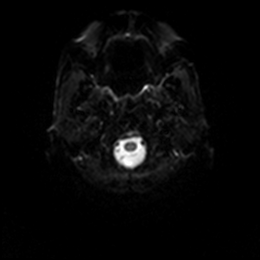
[im 20/96]
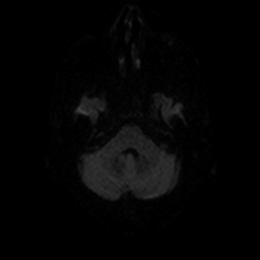
[im 39/96]
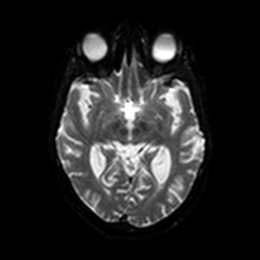
[im 58/96]
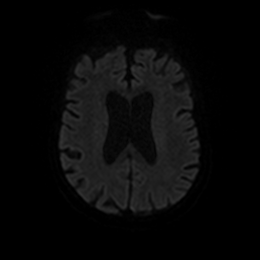
[im 77/96]
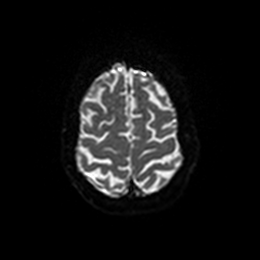
[im 96/96]
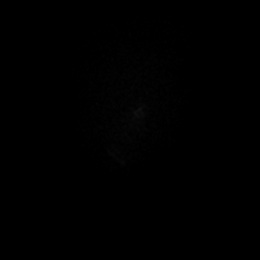

[Series 6: DWI · axial · 3.0mm · 0.88mm/px · z∈[-102,+39]mm · 4 of 48 slices shown (2 of 4)]
[im 1/48]
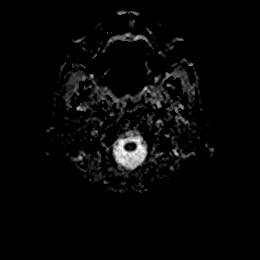
[im 16/48]
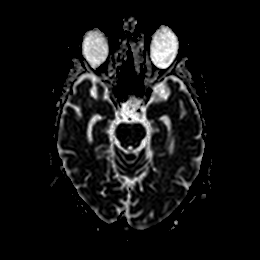
[im 32/48]
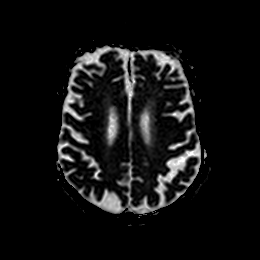
[im 48/48]
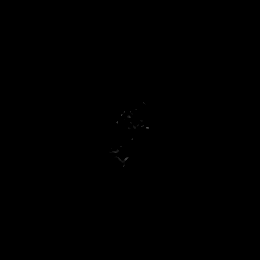

[Series 7: DWI · coronal · 4.0mm · 0.88mm/px · 5 of 64 slices shown (3 of 4)]
[im 1/64]
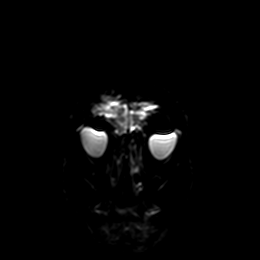
[im 16/64]
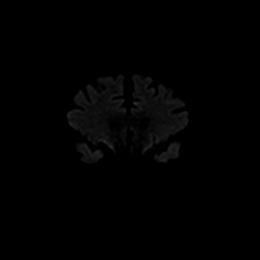
[im 32/64]
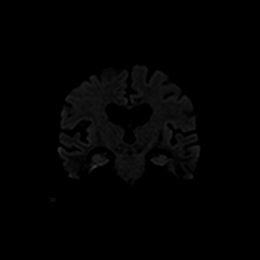
[im 48/64]
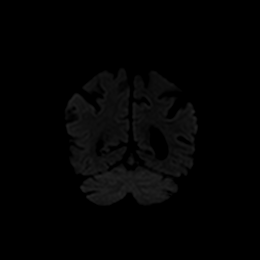
[im 64/64]
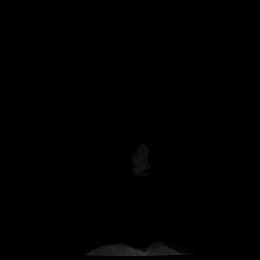

[Series 8: DWI · coronal · 4.0mm · 0.88mm/px · 2 of 32 slices shown (4 of 4)]
[im 1/32]
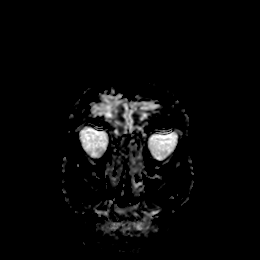
[im 32/32]
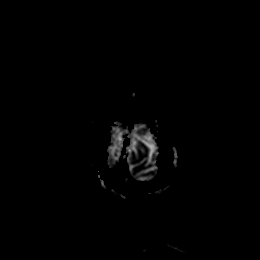

[Series 9: T1 · sagittal · 5.0mm · 0.75mm/px · 2 of 23 slices shown]
[im 1/23]
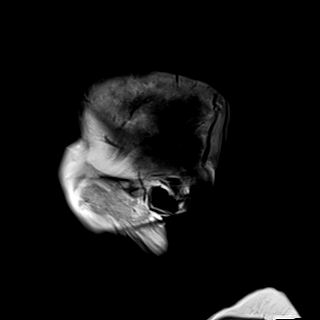
[im 23/23]
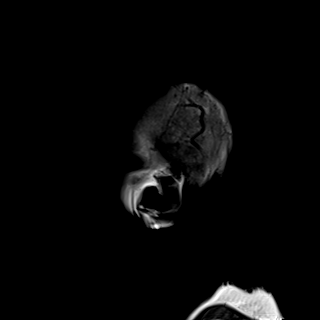

[Series 10: T2 · axial · 5.0mm · 0.72mm/px · z∈[-96,+47]mm · 2 of 25 slices shown]
[im 1/25]
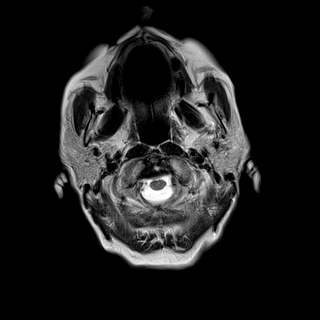
[im 25/25]
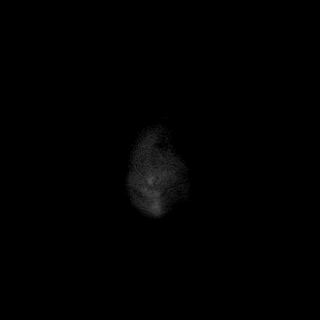

[Series 11: FLAIR · axial · 5.0mm · 0.45mm/px · z∈[-95,+47]mm · 2 of 25 slices shown]
[im 1/25]
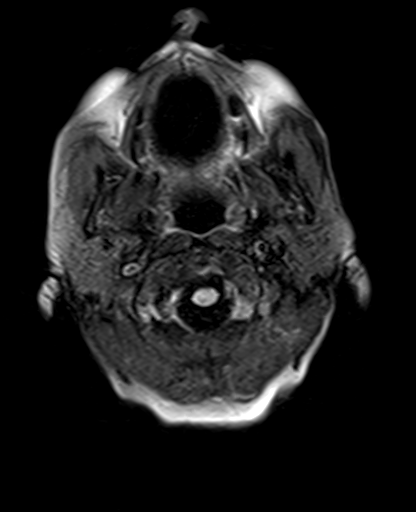
[im 25/25]
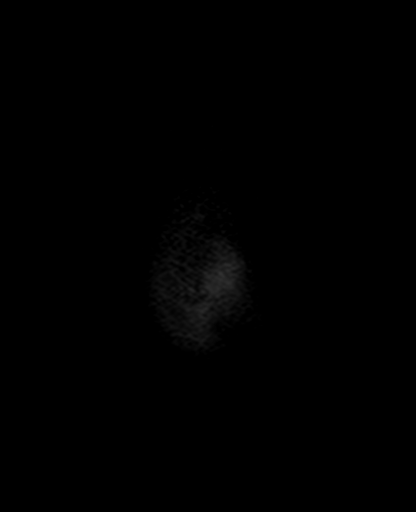

[Series 12: mag_images · axial · 3.0mm · 0.90mm/px · z∈[-112,+64]mm · 5 of 60 slices shown]
[im 1/60]
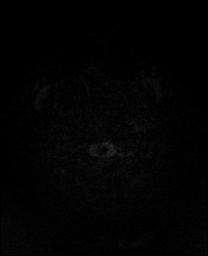
[im 15/60]
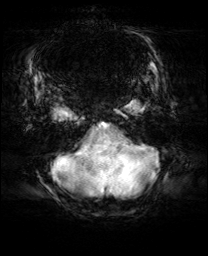
[im 30/60]
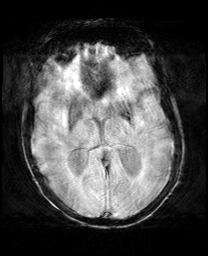
[im 45/60]
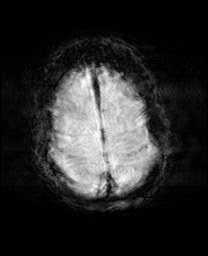
[im 60/60]
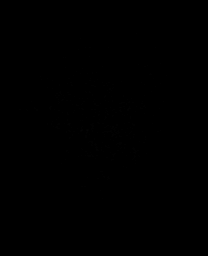

[Series 13: pha_images · axial · 3.0mm · 0.90mm/px · z∈[-109,+49]mm · 4 of 54 slices shown]
[im 1/54]
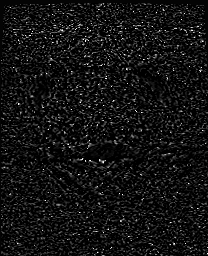
[im 18/54]
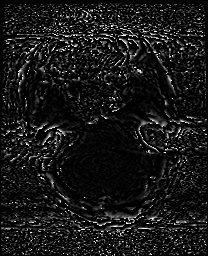
[im 36/54]
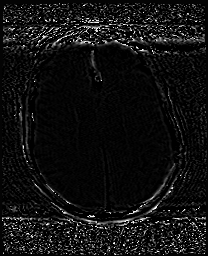
[im 54/54]
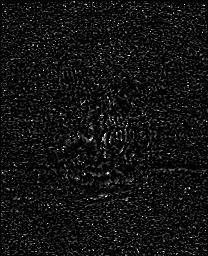

[Series 14: swi_images · axial · 3.0mm · 0.90mm/px · z∈[-112,+64]mm · 5 of 60 slices shown]
[im 1/60]
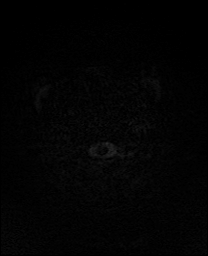
[im 15/60]
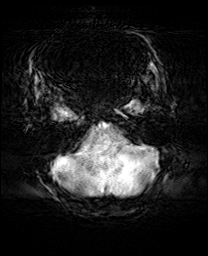
[im 30/60]
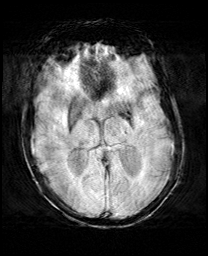
[im 45/60]
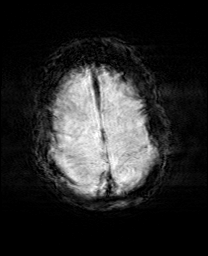
[im 60/60]
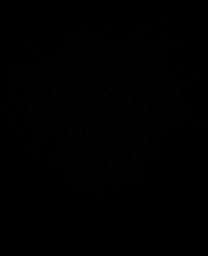

[Series 15: mip_images(sw) · axial · 24.0mm · 0.90mm/px · z∈[-101,+53]mm · 4 of 53 slices shown]
[im 1/53]
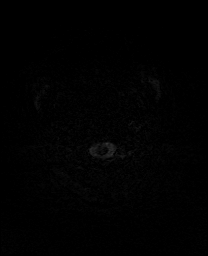
[im 18/53]
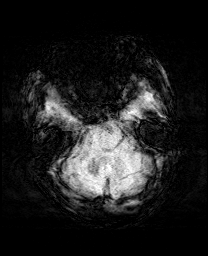
[im 35/53]
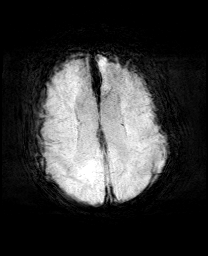
[im 53/53]
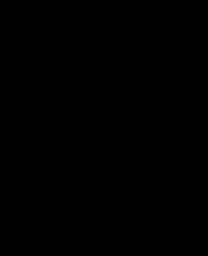

[Series 17: T2 post-contrast · coronal · 5.0mm · 0.72mm/px · 2 of 28 slices shown]
[im 1/28]
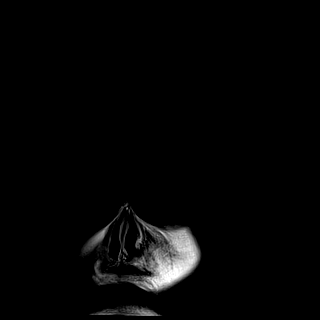
[im 28/28]
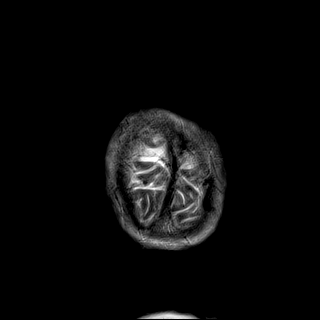

[43 of 48 positions shown; findings below may reference images not displayed]

FINDINGS: Brain: Diffusion imaging does not show any acute or subacute
infarction or other cause of restricted diffusion. No abnormality
affects the brainstem or cerebellum. Cerebral hemispheres show age
related atrophy with mild chronic small-vessel ischemic changes of
the white matter, often seen at this age. No cortical or large
vessel territory infarction. No mass lesion, hemorrhage,
hydrocephalus or extra-axial collection.

Vascular: Major vessels at the base of the brain show flow.

Skull and upper cervical spine: Negative

Sinuses/Orbits: Sinuses are clear except for a small amount of fluid
layering in the left division of the sphenoid sinus, not likely
significant. Orbits negative. Previous lens implant on right

Other: None
IMPRESSION: No acute finding. Age related atrophy. Mild chronic small-vessel
ischemic changes of the white matter, often seen at this age.

## 2022-01-20 IMAGING — DX DG ANKLE PORT 2V*R*
2 series · 2 of 2 positions shown · non-contrast
Comparison: None.

CLINICAL DATA: 79-year-old female with soft tissue swelling of the
lateral malleolus. No known injury.

EXAM:
PORTABLE RIGHT ANKLE - 2 VIEW; RIGHT FOOT - 2 VIEW

[ankle ap]
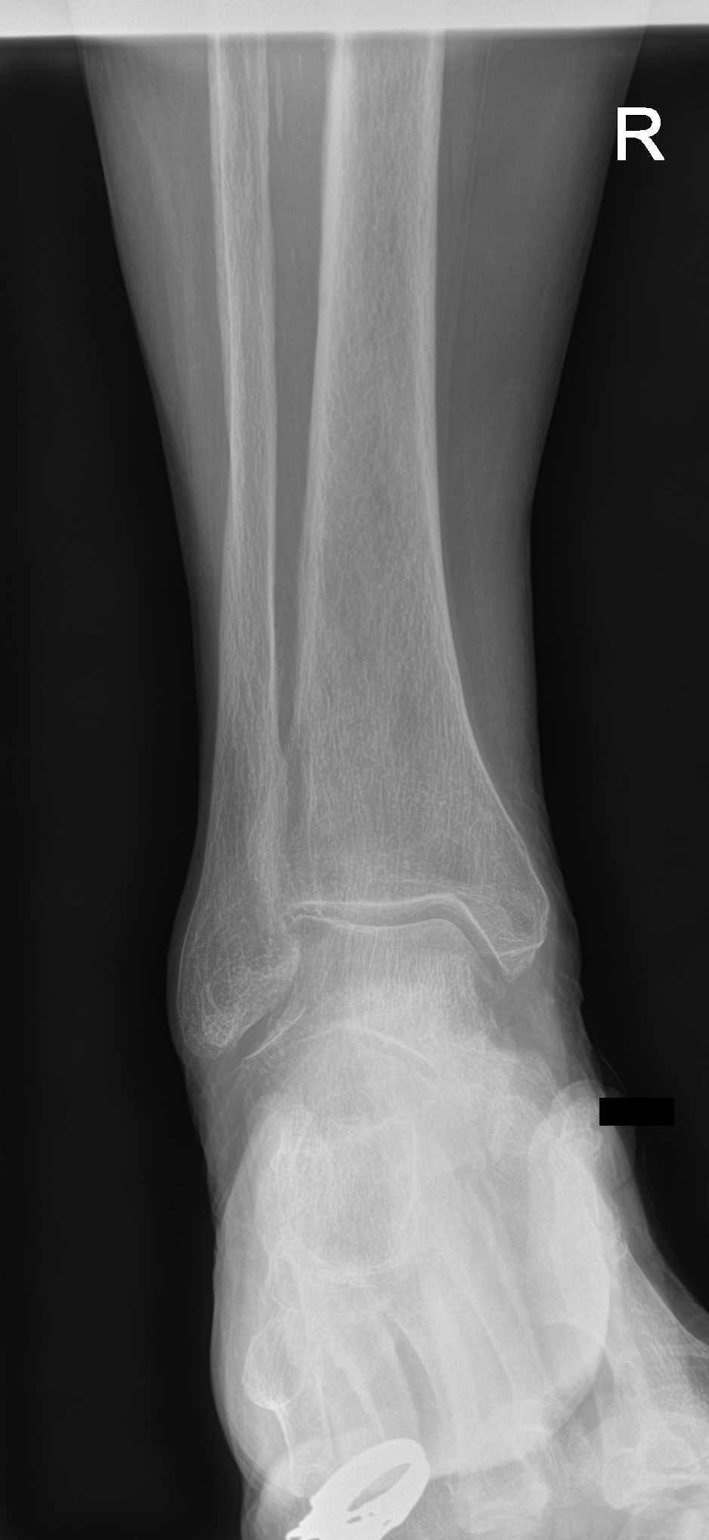

[ankle obl]
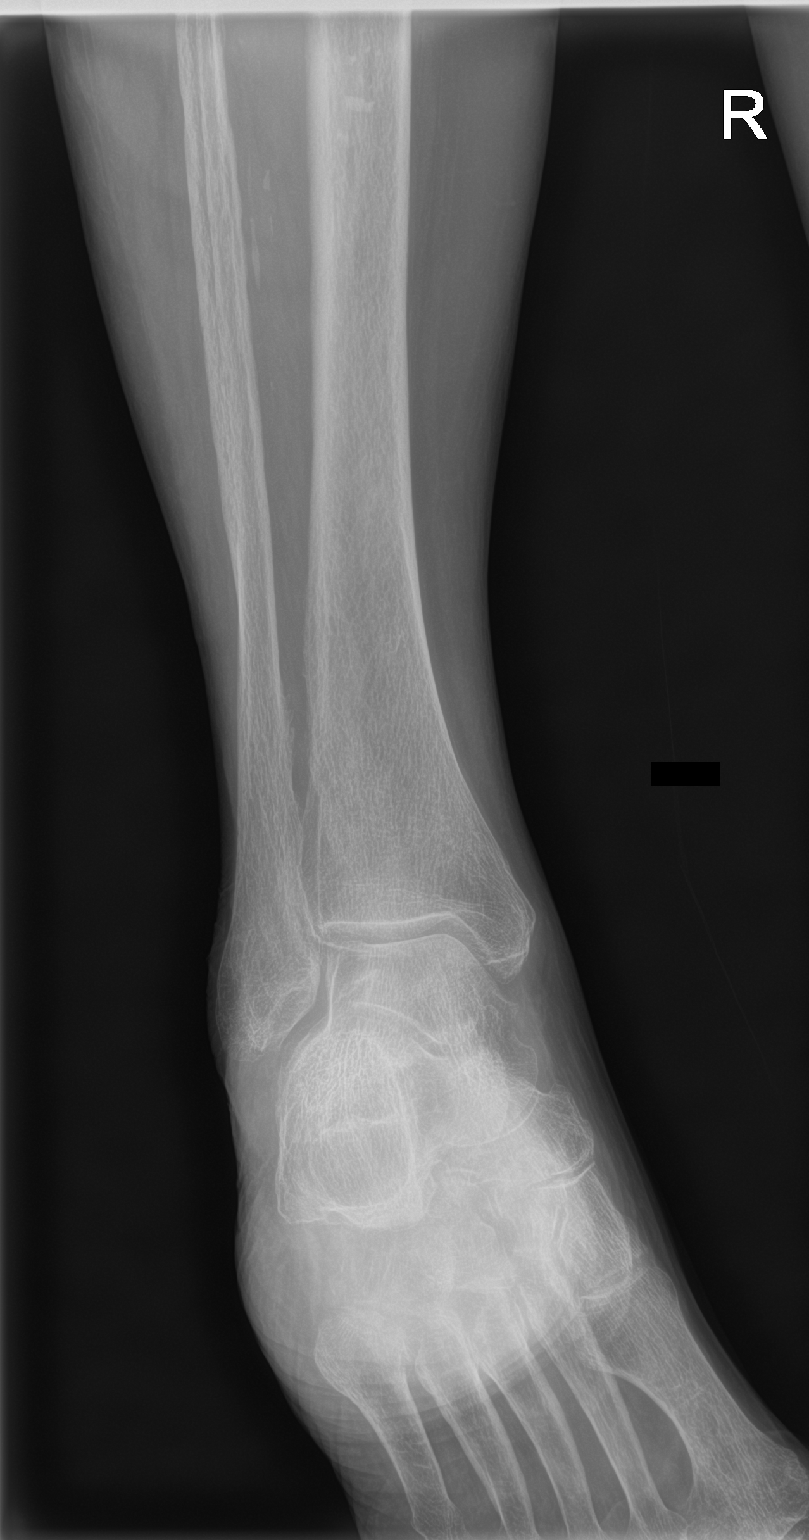

[2 of 2 positions shown; findings below may reference images not displayed]

FINDINGS: There is no acute fracture or dislocation. The bones are osteopenic.
The ankle mortise is intact. The soft tissues are unremarkable.
IMPRESSION: No acute fracture or dislocation.

## 2022-01-21 IMAGING — DX DG HIP (WITH OR WITHOUT PELVIS) 2-3V*R*
3 series · 3 of 3 positions shown · non-contrast
Comparison: Plain film of the pelvis 04/13/2020. PET CT scan
01/25/2020.

CLINICAL DATA: Acute onset right hip pain. No known injury. History
of prior right hip replacement.

EXAM:
DG HIP (WITH OR WITHOUT PELVIS) 2-3V RIGHT

[pelvis ap]
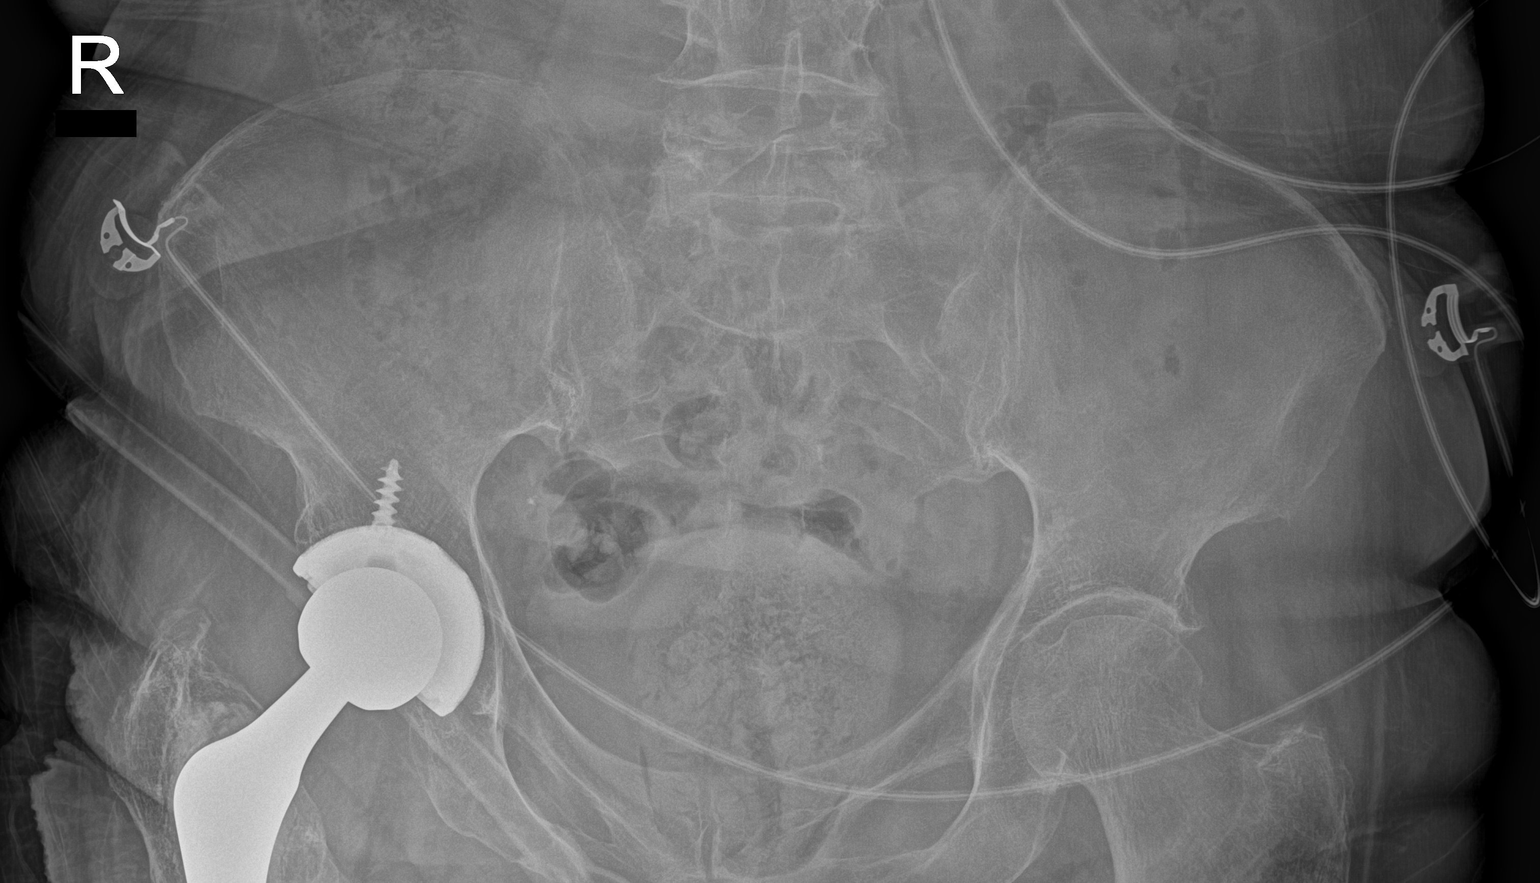

[hip ap]
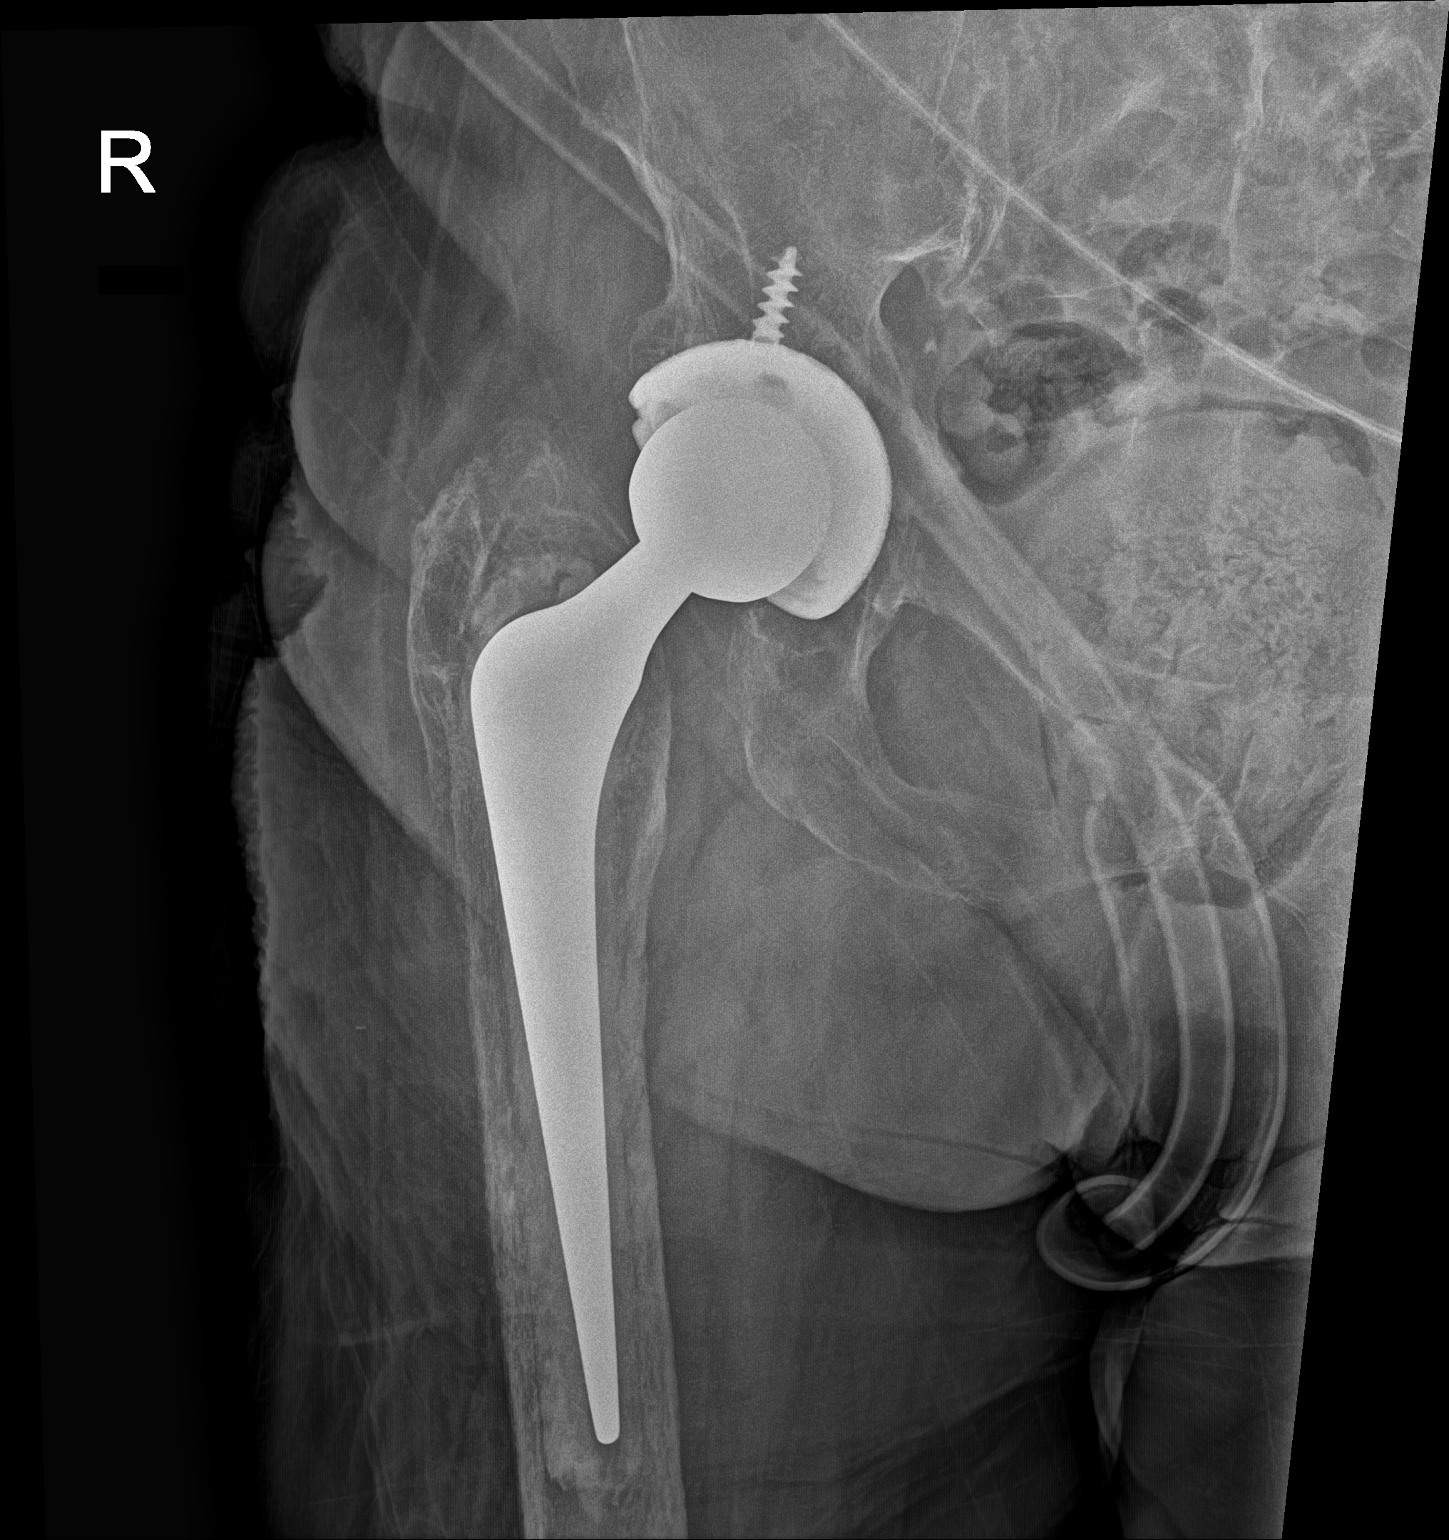

[hip lat]
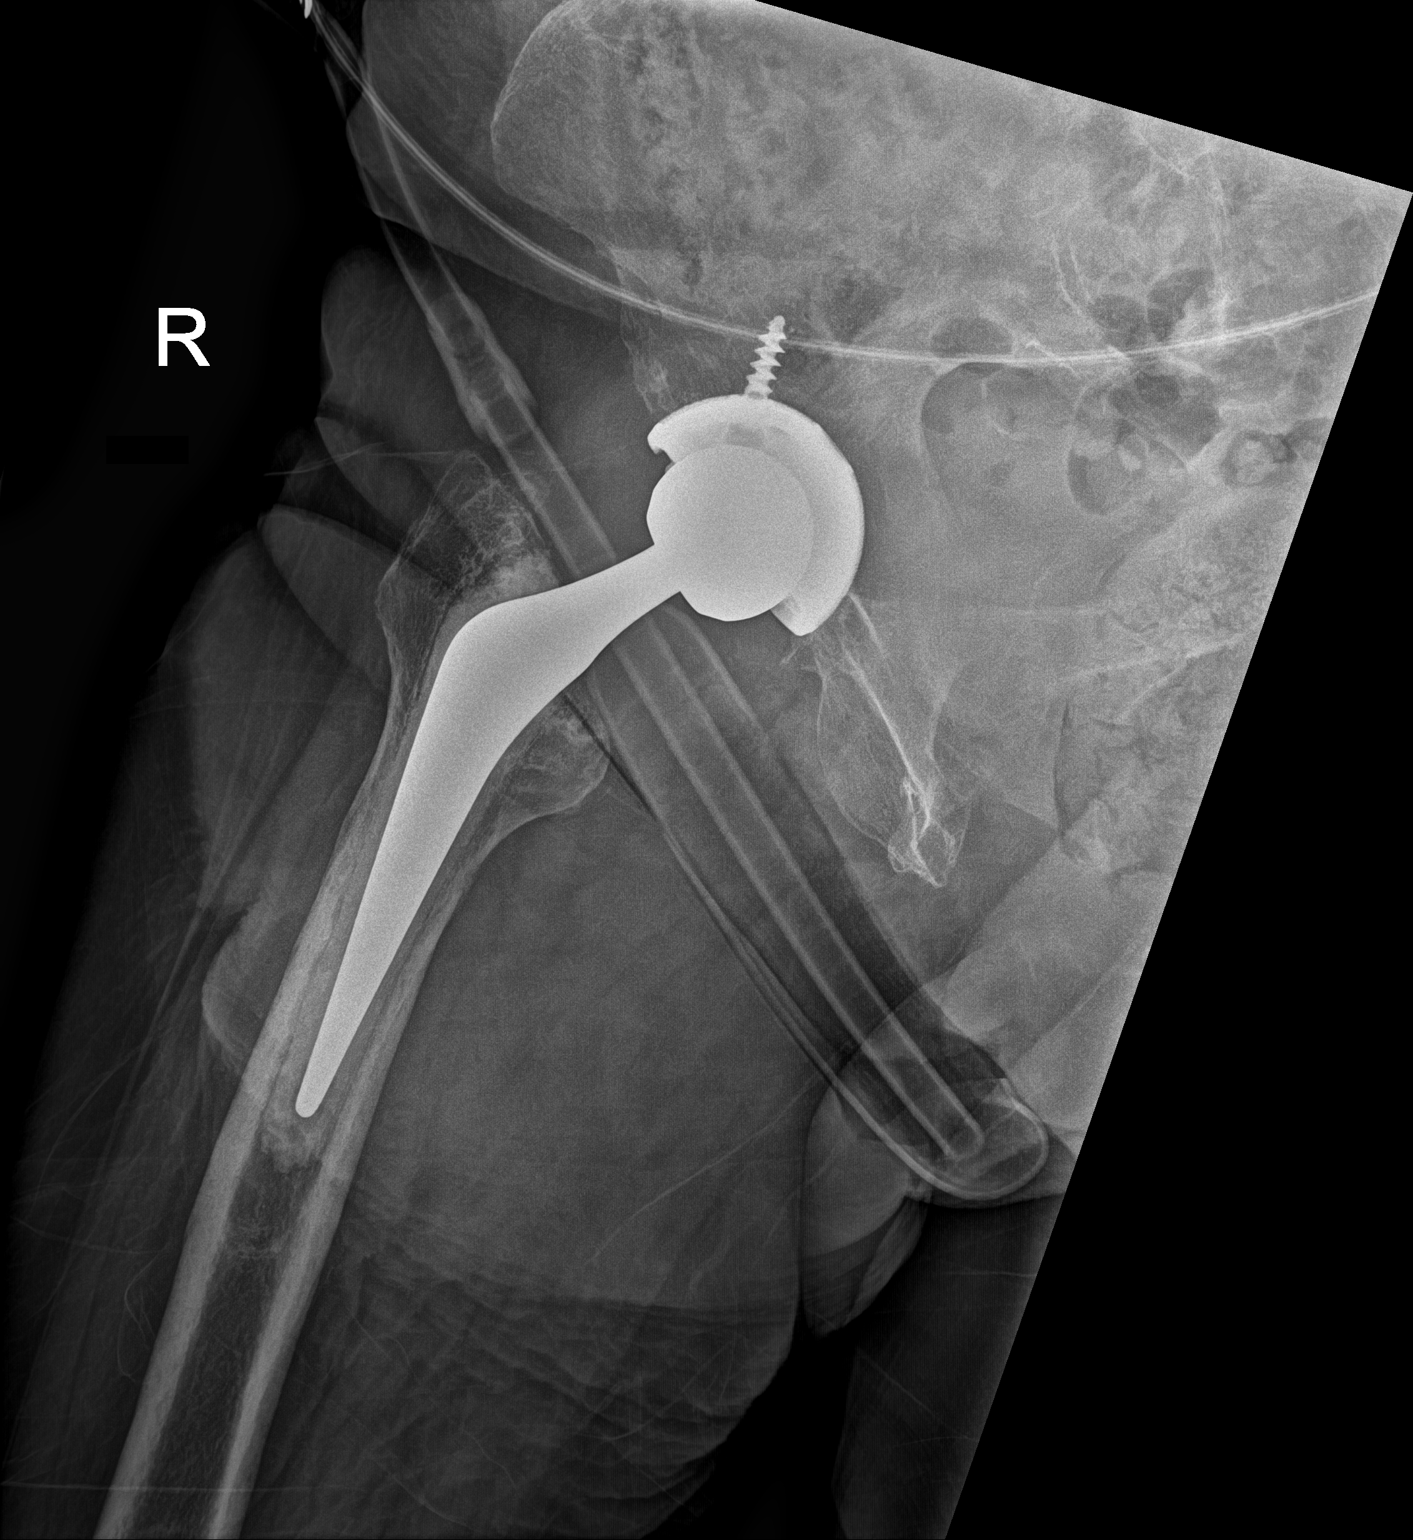

[3 of 3 positions shown; findings below may reference images not displayed]

FINDINGS: The first image which is the AP view is over penetrated. Right hip
arthroplasty is in place. No hardware complication. No fracture,
dislocation or focal bony lesion. Soft tissues are negative.
IMPRESSION: No acute abnormality.  Status post right hip replacement.
